# Patient Record
Sex: Male | Born: 1940
Health system: Southern US, Community
[De-identification: ages and names within clinical notes are randomized; demographics above are authoritative.]

## PROBLEM LIST (undated history)

## (undated) ENCOUNTER — Encounter

## (undated) ENCOUNTER — Telehealth

## (undated) DIAGNOSIS — Z9889 Other specified postprocedural states: Secondary | ICD-10-CM

## (undated) DIAGNOSIS — N189 Chronic kidney disease, unspecified: Secondary | ICD-10-CM

## (undated) DIAGNOSIS — G20A1 Parkinson's disease without dyskinesia, without mention of fluctuations: Secondary | ICD-10-CM

## (undated) DIAGNOSIS — K219 Gastro-esophageal reflux disease without esophagitis: Secondary | ICD-10-CM

## (undated) DIAGNOSIS — Z8673 Personal history of transient ischemic attack (TIA), and cerebral infarction without residual deficits: Secondary | ICD-10-CM

## (undated) DIAGNOSIS — N210 Calculus in bladder: Secondary | ICD-10-CM

## (undated) DIAGNOSIS — R351 Nocturia: Secondary | ICD-10-CM

## (undated) DIAGNOSIS — N183 Chronic kidney disease, stage 3 unspecified: Secondary | ICD-10-CM

## (undated) DIAGNOSIS — I6529 Occlusion and stenosis of unspecified carotid artery: Secondary | ICD-10-CM

## (undated) DIAGNOSIS — R350 Frequency of micturition: Secondary | ICD-10-CM

## (undated) DIAGNOSIS — E559 Vitamin D deficiency, unspecified: Secondary | ICD-10-CM

## (undated) DIAGNOSIS — N400 Enlarged prostate without lower urinary tract symptoms: Secondary | ICD-10-CM

## (undated) DIAGNOSIS — IMO0002 Reserved for concepts with insufficient information to code with codable children: Secondary | ICD-10-CM

## (undated) DIAGNOSIS — L309 Dermatitis, unspecified: Secondary | ICD-10-CM

## (undated) DIAGNOSIS — E785 Hyperlipidemia, unspecified: Secondary | ICD-10-CM

## (undated) DIAGNOSIS — Z8679 Personal history of other diseases of the circulatory system: Secondary | ICD-10-CM

## (undated) DIAGNOSIS — I679 Cerebrovascular disease, unspecified: Secondary | ICD-10-CM

## (undated) DIAGNOSIS — F411 Generalized anxiety disorder: Secondary | ICD-10-CM

## (undated) DIAGNOSIS — M1990 Unspecified osteoarthritis, unspecified site: Secondary | ICD-10-CM

## (undated) DIAGNOSIS — I10 Essential (primary) hypertension: Secondary | ICD-10-CM

## (undated) DIAGNOSIS — I639 Cerebral infarction, unspecified: Secondary | ICD-10-CM

## (undated) HISTORY — DX: Occlusion and stenosis of unspecified carotid artery: I65.29

## (undated) HISTORY — DX: Frequency of micturition: R35.0

## (undated) HISTORY — PX: APPENDECTOMY: SHX54

## (undated) HISTORY — DX: Unspecified osteoarthritis, unspecified site: M19.90

## (undated) HISTORY — DX: Generalized anxiety disorder: F41.1

## (undated) HISTORY — DX: Reserved for concepts with insufficient information to code with codable children: IMO0002

## (undated) HISTORY — PX: PROSTATE SURGERY: SHX751

## (undated) HISTORY — DX: Chronic kidney disease, unspecified: N18.9

## (undated) HISTORY — DX: Hyperlipidemia, unspecified: E78.5

## (undated) HISTORY — PX: CARDIOVASCULAR STRESS TEST: SHX262

## (undated) HISTORY — PX: CAROTID ENDARTERECTOMY: SUR193

## (undated) HISTORY — DX: Cerebral infarction, unspecified: I63.9

---

## 1994-06-14 DIAGNOSIS — I639 Cerebral infarction, unspecified: Secondary | ICD-10-CM

## 1994-06-14 HISTORY — DX: Cerebral infarction, unspecified: I63.9

## 1998-03-16 ENCOUNTER — Emergency Department (HOSPITAL_COMMUNITY): Admission: EM | Admit: 1998-03-16 | Discharge: 1998-03-16 | Payer: Self-pay | Admitting: Emergency Medicine

## 2004-06-14 DIAGNOSIS — I639 Cerebral infarction, unspecified: Secondary | ICD-10-CM | POA: Insufficient documentation

## 2004-06-14 HISTORY — DX: Cerebral infarction, unspecified: I63.9

## 2004-12-02 ENCOUNTER — Ambulatory Visit: Payer: Self-pay | Admitting: Family Medicine

## 2005-02-01 ENCOUNTER — Encounter: Admission: RE | Admit: 2005-02-01 | Discharge: 2005-05-02 | Payer: Self-pay | Admitting: Family Medicine

## 2005-02-09 ENCOUNTER — Ambulatory Visit: Payer: Self-pay | Admitting: Family Medicine

## 2005-02-19 ENCOUNTER — Ambulatory Visit: Payer: Self-pay | Admitting: Family Medicine

## 2005-03-12 ENCOUNTER — Ambulatory Visit: Payer: Self-pay | Admitting: Family Medicine

## 2005-04-02 ENCOUNTER — Ambulatory Visit: Payer: Self-pay | Admitting: Family Medicine

## 2005-04-20 ENCOUNTER — Ambulatory Visit: Payer: Self-pay | Admitting: Family Medicine

## 2005-04-29 ENCOUNTER — Ambulatory Visit: Payer: Self-pay | Admitting: Family Medicine

## 2005-07-06 ENCOUNTER — Ambulatory Visit: Payer: Self-pay | Admitting: Family Medicine

## 2005-07-12 ENCOUNTER — Ambulatory Visit: Payer: Self-pay | Admitting: Family Medicine

## 2005-07-13 ENCOUNTER — Emergency Department (HOSPITAL_COMMUNITY): Admission: EM | Admit: 2005-07-13 | Discharge: 2005-07-13 | Payer: Self-pay | Admitting: Emergency Medicine

## 2005-08-07 ENCOUNTER — Encounter: Admission: RE | Admit: 2005-08-07 | Discharge: 2005-08-07 | Payer: Self-pay | Admitting: Neurology

## 2005-10-13 ENCOUNTER — Encounter: Payer: Self-pay | Admitting: Emergency Medicine

## 2005-11-04 ENCOUNTER — Encounter: Admission: RE | Admit: 2005-11-04 | Discharge: 2005-11-04 | Payer: Self-pay | Admitting: *Deleted

## 2005-11-10 ENCOUNTER — Ambulatory Visit: Payer: Self-pay | Admitting: Family Medicine

## 2005-11-15 ENCOUNTER — Encounter: Admission: RE | Admit: 2005-11-15 | Discharge: 2005-11-15 | Payer: Self-pay | Admitting: Family Medicine

## 2006-04-12 ENCOUNTER — Ambulatory Visit: Payer: Self-pay | Admitting: Family Medicine

## 2006-04-22 ENCOUNTER — Ambulatory Visit: Payer: Self-pay | Admitting: Internal Medicine

## 2006-08-30 ENCOUNTER — Ambulatory Visit: Payer: Self-pay | Admitting: Family Medicine

## 2006-08-30 LAB — CONVERTED CEMR LAB
Alkaline Phosphatase: 64 units/L (ref 39–117)
Basophils Absolute: 0 10*3/uL (ref 0.0–0.1)
Basophils Relative: 1 % (ref 0–1)
Bilirubin, Direct: 0.1 mg/dL (ref 0.0–0.3)
CO2: 23 meq/L (ref 19–32)
Calcium: 9.7 mg/dL (ref 8.4–10.5)
Chloride: 103 meq/L (ref 96–112)
Eosinophils Absolute: 0.2 10*3/uL (ref 0.0–0.7)
Glucose, Bld: 105 mg/dL — ABNORMAL HIGH (ref 70–99)
HCT: 45.6 % (ref 39.0–52.0)
Indirect Bilirubin: 0.4 mg/dL (ref 0.0–0.9)
Lymphs Abs: 2.9 10*3/uL (ref 0.7–3.3)
MCV: 93.8 fL (ref 78.0–100.0)
Monocytes Absolute: 0.6 10*3/uL (ref 0.2–0.7)
Monocytes Relative: 7 % (ref 3–11)
Neutrophils Relative %: 55 % (ref 43–77)
RBC: 4.86 M/uL (ref 4.22–5.81)
WBC: 8.1 10*3/uL (ref 4.0–10.5)

## 2006-09-27 DIAGNOSIS — I6350 Cerebral infarction due to unspecified occlusion or stenosis of unspecified cerebral artery: Secondary | ICD-10-CM | POA: Insufficient documentation

## 2006-09-27 DIAGNOSIS — E785 Hyperlipidemia, unspecified: Secondary | ICD-10-CM | POA: Insufficient documentation

## 2006-10-27 ENCOUNTER — Ambulatory Visit: Payer: Self-pay | Admitting: *Deleted

## 2006-10-27 ENCOUNTER — Encounter: Payer: Self-pay | Admitting: Family Medicine

## 2006-10-28 ENCOUNTER — Ambulatory Visit: Payer: Self-pay | Admitting: Family Medicine

## 2006-11-08 ENCOUNTER — Ambulatory Visit (HOSPITAL_BASED_OUTPATIENT_CLINIC_OR_DEPARTMENT_OTHER): Admission: RE | Admit: 2006-11-08 | Discharge: 2006-11-08 | Payer: Self-pay | Admitting: General Surgery

## 2006-11-08 ENCOUNTER — Encounter: Payer: Self-pay | Admitting: Family Medicine

## 2006-11-08 HISTORY — PX: INGUINAL HERNIA REPAIR: SUR1180

## 2006-12-21 ENCOUNTER — Ambulatory Visit: Payer: Self-pay | Admitting: Family Medicine

## 2006-12-21 DIAGNOSIS — Z8679 Personal history of other diseases of the circulatory system: Secondary | ICD-10-CM | POA: Insufficient documentation

## 2006-12-21 DIAGNOSIS — F411 Generalized anxiety disorder: Secondary | ICD-10-CM | POA: Insufficient documentation

## 2006-12-21 DIAGNOSIS — R7309 Other abnormal glucose: Secondary | ICD-10-CM

## 2006-12-21 DIAGNOSIS — R498 Other voice and resonance disorders: Secondary | ICD-10-CM | POA: Insufficient documentation

## 2006-12-21 HISTORY — DX: Other abnormal glucose: R73.09

## 2006-12-28 ENCOUNTER — Encounter: Payer: Self-pay | Admitting: Family Medicine

## 2007-01-23 ENCOUNTER — Telehealth (INDEPENDENT_AMBULATORY_CARE_PROVIDER_SITE_OTHER): Payer: Self-pay | Admitting: *Deleted

## 2007-01-27 ENCOUNTER — Encounter: Payer: Self-pay | Admitting: Family Medicine

## 2007-01-27 ENCOUNTER — Ambulatory Visit (HOSPITAL_COMMUNITY): Admission: RE | Admit: 2007-01-27 | Discharge: 2007-01-27 | Payer: Self-pay | Admitting: Gastroenterology

## 2007-01-27 ENCOUNTER — Encounter (INDEPENDENT_AMBULATORY_CARE_PROVIDER_SITE_OTHER): Payer: Self-pay | Admitting: Gastroenterology

## 2007-02-01 ENCOUNTER — Encounter: Payer: Self-pay | Admitting: Family Medicine

## 2007-03-22 ENCOUNTER — Ambulatory Visit: Payer: Self-pay | Admitting: Family Medicine

## 2007-03-29 ENCOUNTER — Encounter (INDEPENDENT_AMBULATORY_CARE_PROVIDER_SITE_OTHER): Payer: Self-pay | Admitting: *Deleted

## 2007-03-29 LAB — CONVERTED CEMR LAB
BUN: 15 mg/dL (ref 6–23)
Calcium: 9.7 mg/dL (ref 8.4–10.5)
Chloride: 109 meq/L (ref 96–112)
Creatinine, Ser: 1.2 mg/dL (ref 0.4–1.5)
Potassium: 4.2 meq/L (ref 3.5–5.1)
Sodium: 143 meq/L (ref 135–145)

## 2007-04-03 ENCOUNTER — Encounter: Payer: Self-pay | Admitting: Family Medicine

## 2007-04-10 ENCOUNTER — Encounter: Payer: Self-pay | Admitting: Family Medicine

## 2007-04-20 ENCOUNTER — Ambulatory Visit: Payer: Self-pay | Admitting: Family Medicine

## 2007-04-25 ENCOUNTER — Encounter: Payer: Self-pay | Admitting: Family Medicine

## 2007-05-04 ENCOUNTER — Ambulatory Visit: Payer: Self-pay | Admitting: *Deleted

## 2007-07-17 ENCOUNTER — Telehealth (INDEPENDENT_AMBULATORY_CARE_PROVIDER_SITE_OTHER): Payer: Self-pay | Admitting: *Deleted

## 2007-07-28 ENCOUNTER — Ambulatory Visit: Payer: Self-pay | Admitting: Family Medicine

## 2007-07-28 ENCOUNTER — Telehealth (INDEPENDENT_AMBULATORY_CARE_PROVIDER_SITE_OTHER): Payer: Self-pay | Admitting: *Deleted

## 2007-07-31 ENCOUNTER — Encounter (INDEPENDENT_AMBULATORY_CARE_PROVIDER_SITE_OTHER): Payer: Self-pay | Admitting: *Deleted

## 2007-08-01 ENCOUNTER — Encounter: Payer: Self-pay | Admitting: Family Medicine

## 2007-08-06 DIAGNOSIS — Z87898 Personal history of other specified conditions: Secondary | ICD-10-CM

## 2007-08-06 DIAGNOSIS — N400 Enlarged prostate without lower urinary tract symptoms: Secondary | ICD-10-CM | POA: Insufficient documentation

## 2007-08-06 HISTORY — DX: Benign prostatic hyperplasia without lower urinary tract symptoms: N40.0

## 2007-08-23 ENCOUNTER — Ambulatory Visit: Payer: Self-pay | Admitting: Vascular Surgery

## 2007-08-23 ENCOUNTER — Encounter (INDEPENDENT_AMBULATORY_CARE_PROVIDER_SITE_OTHER): Payer: Self-pay | Admitting: Emergency Medicine

## 2007-08-23 ENCOUNTER — Emergency Department (HOSPITAL_COMMUNITY): Admission: EM | Admit: 2007-08-23 | Discharge: 2007-08-23 | Payer: Self-pay | Admitting: Emergency Medicine

## 2007-09-01 ENCOUNTER — Ambulatory Visit (HOSPITAL_COMMUNITY): Admission: RE | Admit: 2007-09-01 | Discharge: 2007-09-01 | Payer: Self-pay | Admitting: Neurology

## 2007-11-22 ENCOUNTER — Ambulatory Visit: Payer: Self-pay | Admitting: Family Medicine

## 2007-11-22 DIAGNOSIS — R1012 Left upper quadrant pain: Secondary | ICD-10-CM | POA: Insufficient documentation

## 2008-01-24 ENCOUNTER — Ambulatory Visit: Payer: Self-pay | Admitting: Family Medicine

## 2008-01-26 ENCOUNTER — Ambulatory Visit: Payer: Self-pay | Admitting: Family Medicine

## 2008-01-26 DIAGNOSIS — R5381 Other malaise: Secondary | ICD-10-CM | POA: Insufficient documentation

## 2008-01-26 DIAGNOSIS — R5383 Other fatigue: Secondary | ICD-10-CM

## 2008-01-26 LAB — CONVERTED CEMR LAB
AST: 29 units/L (ref 0–37)
Albumin: 3.8 g/dL (ref 3.5–5.2)
Alkaline Phosphatase: 49 units/L (ref 39–117)
Bilirubin, Direct: 0.1 mg/dL (ref 0.0–0.3)
Cholesterol: 124 mg/dL (ref 0–200)
Total Bilirubin: 0.7 mg/dL (ref 0.3–1.2)
Total Protein: 7.6 g/dL (ref 6.0–8.3)

## 2008-01-27 LAB — CONVERTED CEMR LAB
ALT: 24 units/L (ref 0–53)
Albumin: 3.9 g/dL (ref 3.5–5.2)
Basophils Absolute: 0.1 10*3/uL (ref 0.0–0.1)
Basophils Relative: 0.8 % (ref 0.0–3.0)
Bilirubin, Direct: 0.1 mg/dL (ref 0.0–0.3)
CO2: 28 meq/L (ref 19–32)
Calcium: 9.4 mg/dL (ref 8.4–10.5)
Chloride: 106 meq/L (ref 96–112)
Eosinophils Relative: 2.8 % (ref 0.0–5.0)
GFR calc Af Amer: 71 mL/min
GFR calc non Af Amer: 59 mL/min
Hemoglobin: 13.4 g/dL (ref 13.0–17.0)
Hgb A1c MFr Bld: 5.8 % (ref 4.6–6.0)
Lymphocytes Relative: 31.6 % (ref 12.0–46.0)
MCHC: 34 g/dL (ref 30.0–36.0)
Monocytes Absolute: 0.7 10*3/uL (ref 0.1–1.0)
Monocytes Relative: 9.2 % (ref 3.0–12.0)
Neutrophils Relative %: 55.6 % (ref 43.0–77.0)
Potassium: 4.4 meq/L (ref 3.5–5.1)
RDW: 12.8 % (ref 11.5–14.6)
Sodium: 140 meq/L (ref 135–145)
TSH: 1.44 microintl units/mL (ref 0.35–5.50)
Total Bilirubin: 0.7 mg/dL (ref 0.3–1.2)

## 2008-01-29 ENCOUNTER — Encounter (INDEPENDENT_AMBULATORY_CARE_PROVIDER_SITE_OTHER): Payer: Self-pay | Admitting: *Deleted

## 2008-02-05 ENCOUNTER — Telehealth (INDEPENDENT_AMBULATORY_CARE_PROVIDER_SITE_OTHER): Payer: Self-pay | Admitting: *Deleted

## 2008-02-06 LAB — CONVERTED CEMR LAB
Sex Hormone Binding: 63 nmol/L (ref 13–71)
Testosterone Free: 49.2 pg/mL (ref 47.0–244.0)
Testosterone-% Free: 1.3 % — ABNORMAL LOW (ref 1.6–2.9)
Testosterone: 380.23 ng/dL (ref 350–890)

## 2008-02-07 ENCOUNTER — Encounter (INDEPENDENT_AMBULATORY_CARE_PROVIDER_SITE_OTHER): Payer: Self-pay | Admitting: *Deleted

## 2008-02-20 ENCOUNTER — Telehealth (INDEPENDENT_AMBULATORY_CARE_PROVIDER_SITE_OTHER): Payer: Self-pay | Admitting: *Deleted

## 2008-02-26 ENCOUNTER — Ambulatory Visit: Payer: Self-pay | Admitting: Family Medicine

## 2008-02-28 LAB — CONVERTED CEMR LAB: Vit D, 1,25-Dihydroxy: 32 (ref 30–89)

## 2008-02-29 ENCOUNTER — Encounter (INDEPENDENT_AMBULATORY_CARE_PROVIDER_SITE_OTHER): Payer: Self-pay | Admitting: *Deleted

## 2008-03-01 ENCOUNTER — Ambulatory Visit: Payer: Self-pay | Admitting: Family Medicine

## 2008-03-01 DIAGNOSIS — E559 Vitamin D deficiency, unspecified: Secondary | ICD-10-CM | POA: Insufficient documentation

## 2008-03-04 ENCOUNTER — Encounter: Payer: Self-pay | Admitting: Family Medicine

## 2008-03-04 ENCOUNTER — Ambulatory Visit: Payer: Self-pay | Admitting: Internal Medicine

## 2008-03-29 ENCOUNTER — Encounter (INDEPENDENT_AMBULATORY_CARE_PROVIDER_SITE_OTHER): Payer: Self-pay | Admitting: *Deleted

## 2008-04-29 ENCOUNTER — Telehealth (INDEPENDENT_AMBULATORY_CARE_PROVIDER_SITE_OTHER): Payer: Self-pay | Admitting: *Deleted

## 2008-05-31 ENCOUNTER — Ambulatory Visit: Payer: Self-pay | Admitting: Family Medicine

## 2008-05-31 DIAGNOSIS — L2089 Other atopic dermatitis: Secondary | ICD-10-CM | POA: Insufficient documentation

## 2008-06-17 ENCOUNTER — Ambulatory Visit: Payer: Self-pay | Admitting: Family Medicine

## 2008-06-19 LAB — CONVERTED CEMR LAB
Albumin: 4.1 g/dL (ref 3.5–5.2)
Alkaline Phosphatase: 51 units/L (ref 39–117)
BUN: 17 mg/dL (ref 6–23)
Chloride: 104 meq/L (ref 96–112)
Hgb A1c MFr Bld: 5.8 % (ref 4.6–6.0)
Total Bilirubin: 0.6 mg/dL (ref 0.3–1.2)
Total CHOL/HDL Ratio: 2.4
Total Protein: 7.9 g/dL (ref 6.0–8.3)
Triglycerides: 85 mg/dL (ref 0–149)
Vit D, 1,25-Dihydroxy: 62 (ref 30–89)

## 2008-06-21 ENCOUNTER — Encounter (INDEPENDENT_AMBULATORY_CARE_PROVIDER_SITE_OTHER): Payer: Self-pay | Admitting: *Deleted

## 2008-08-05 ENCOUNTER — Telehealth (INDEPENDENT_AMBULATORY_CARE_PROVIDER_SITE_OTHER): Payer: Self-pay | Admitting: *Deleted

## 2008-08-21 ENCOUNTER — Encounter: Payer: Self-pay | Admitting: Family Medicine

## 2008-08-26 ENCOUNTER — Telehealth (INDEPENDENT_AMBULATORY_CARE_PROVIDER_SITE_OTHER): Payer: Self-pay | Admitting: *Deleted

## 2008-09-09 ENCOUNTER — Ambulatory Visit: Payer: Self-pay | Admitting: Family Medicine

## 2008-09-11 ENCOUNTER — Encounter (INDEPENDENT_AMBULATORY_CARE_PROVIDER_SITE_OTHER): Payer: Self-pay | Admitting: *Deleted

## 2008-09-11 LAB — CONVERTED CEMR LAB: Vit D, 25-Hydroxy: 57 ng/mL (ref 30–89)

## 2008-10-03 ENCOUNTER — Ambulatory Visit: Payer: Self-pay | Admitting: *Deleted

## 2008-10-10 ENCOUNTER — Ambulatory Visit: Payer: Self-pay | Admitting: *Deleted

## 2008-10-10 ENCOUNTER — Ambulatory Visit (HOSPITAL_COMMUNITY): Admission: RE | Admit: 2008-10-10 | Discharge: 2008-10-10 | Payer: Self-pay | Admitting: *Deleted

## 2008-10-31 ENCOUNTER — Ambulatory Visit (HOSPITAL_COMMUNITY): Admission: RE | Admit: 2008-10-31 | Discharge: 2008-10-31 | Payer: Self-pay | Admitting: *Deleted

## 2008-11-12 ENCOUNTER — Ambulatory Visit: Payer: Self-pay | Admitting: *Deleted

## 2008-11-12 ENCOUNTER — Inpatient Hospital Stay (HOSPITAL_COMMUNITY): Admission: RE | Admit: 2008-11-12 | Discharge: 2008-11-13 | Payer: Self-pay | Admitting: *Deleted

## 2008-11-12 ENCOUNTER — Encounter (INDEPENDENT_AMBULATORY_CARE_PROVIDER_SITE_OTHER): Payer: Self-pay | Admitting: *Deleted

## 2008-12-05 ENCOUNTER — Ambulatory Visit: Payer: Self-pay | Admitting: *Deleted

## 2008-12-23 ENCOUNTER — Ambulatory Visit: Payer: Self-pay | Admitting: Family Medicine

## 2008-12-30 LAB — CONVERTED CEMR LAB
ALT: 24 U/L (ref 0–53)
AST: 27 U/L (ref 0–37)
Albumin: 4 g/dL (ref 3.5–5.2)
Alkaline Phosphatase: 64 U/L (ref 39–117)
BUN: 17 mg/dL (ref 6–23)
Bilirubin, Direct: 0.1 mg/dL (ref 0.0–0.3)
CO2: 29 meq/L (ref 19–32)
Calcium: 9.5 mg/dL (ref 8.4–10.5)
Chloride: 107 meq/L (ref 96–112)
Cholesterol: 137 mg/dL (ref 0–200)
Creatinine, Ser: 1.2 mg/dL (ref 0.4–1.5)
GFR calc non Af Amer: 64.3 mL/min (ref >60)
Glucose, Bld: 104 mg/dL — ABNORMAL HIGH (ref 70–99)
HDL: 52 mg/dL (ref >39.00)
Hgb A1c MFr Bld: 5.8 % (ref 4.6–6.5)
LDL Cholesterol: 70 mg/dL (ref 0–99)
Potassium: 3.9 meq/L (ref 3.5–5.1)
Sodium: 143 meq/L (ref 135–145)
Total Bilirubin: 0.7 mg/dL (ref 0.3–1.2)
Total CHOL/HDL Ratio: 3
Total Protein: 7.7 g/dL (ref 6.0–8.3)
Triglycerides: 76 mg/dL (ref 0.0–149.0)
VLDL: 15.2 mg/dL (ref 0.0–40.0)

## 2008-12-31 ENCOUNTER — Encounter (INDEPENDENT_AMBULATORY_CARE_PROVIDER_SITE_OTHER): Payer: Self-pay | Admitting: *Deleted

## 2009-03-03 ENCOUNTER — Telehealth (INDEPENDENT_AMBULATORY_CARE_PROVIDER_SITE_OTHER): Payer: Self-pay | Admitting: *Deleted

## 2009-04-01 ENCOUNTER — Ambulatory Visit: Payer: Self-pay | Admitting: Family Medicine

## 2009-05-02 ENCOUNTER — Ambulatory Visit: Payer: Self-pay | Admitting: Cardiology

## 2009-05-02 ENCOUNTER — Ambulatory Visit: Payer: Self-pay | Admitting: Family Medicine

## 2009-05-02 DIAGNOSIS — R51 Headache: Secondary | ICD-10-CM | POA: Insufficient documentation

## 2009-05-02 DIAGNOSIS — R42 Dizziness and giddiness: Secondary | ICD-10-CM | POA: Insufficient documentation

## 2009-05-02 DIAGNOSIS — I6529 Occlusion and stenosis of unspecified carotid artery: Secondary | ICD-10-CM | POA: Insufficient documentation

## 2009-05-02 DIAGNOSIS — I6523 Occlusion and stenosis of bilateral carotid arteries: Secondary | ICD-10-CM | POA: Insufficient documentation

## 2009-05-30 ENCOUNTER — Ambulatory Visit: Payer: Self-pay | Admitting: Family Medicine

## 2009-06-02 ENCOUNTER — Ambulatory Visit: Payer: Self-pay | Admitting: Vascular Surgery

## 2009-06-03 ENCOUNTER — Encounter (INDEPENDENT_AMBULATORY_CARE_PROVIDER_SITE_OTHER): Payer: Self-pay | Admitting: *Deleted

## 2009-06-03 LAB — CONVERTED CEMR LAB
AST: 26 units/L (ref 0–37)
Albumin: 4.2 g/dL (ref 3.5–5.2)
Alkaline Phosphatase: 50 units/L (ref 39–117)
Basophils Absolute: 0.1 10*3/uL (ref 0.0–0.1)
Bilirubin, Direct: 0 mg/dL (ref 0.0–0.3)
Cholesterol: 134 mg/dL (ref 0–200)
Creatinine, Ser: 1.4 mg/dL (ref 0.4–1.5)
Eosinophils Relative: 2.5 % (ref 0.0–5.0)
HDL: 53.7 mg/dL (ref >39.00)
Hemoglobin: 14.3 g/dL (ref 13.0–17.0)
Lymphocytes Relative: 27.2 % (ref 12.0–46.0)
Lymphs Abs: 1.9 10*3/uL (ref 0.7–4.0)
MCV: 94.4 fL (ref 78.0–100.0)
Monocytes Relative: 9.9 % (ref 3.0–12.0)
Neutro Abs: 4.2 10*3/uL (ref 1.4–7.7)
Platelets: 233 10*3/uL (ref 150.0–400.0)
Potassium: 4.1 meq/L (ref 3.5–5.1)
RBC: 4.62 M/uL (ref 4.22–5.81)
RDW: 13 % (ref 11.5–14.6)
Sodium: 140 meq/L (ref 135–145)
Total Bilirubin: 0.6 mg/dL (ref 0.3–1.2)
Triglycerides: 67 mg/dL (ref 0.0–149.0)
VLDL: 13.4 mg/dL (ref 0.0–40.0)
WBC: 7.1 10*3/uL (ref 4.5–10.5)

## 2009-06-05 ENCOUNTER — Ambulatory Visit: Payer: Self-pay | Admitting: Family Medicine

## 2009-06-05 ENCOUNTER — Encounter (INDEPENDENT_AMBULATORY_CARE_PROVIDER_SITE_OTHER): Payer: Self-pay | Admitting: *Deleted

## 2009-06-05 LAB — CONVERTED CEMR LAB
OCCULT 1: NEGATIVE
OCCULT 2: NEGATIVE
OCCULT 3: NEGATIVE

## 2009-06-17 ENCOUNTER — Ambulatory Visit: Payer: Self-pay | Admitting: Family

## 2009-06-17 DIAGNOSIS — J029 Acute pharyngitis, unspecified: Secondary | ICD-10-CM | POA: Insufficient documentation

## 2009-06-17 LAB — CONVERTED CEMR LAB: Rapid Strep: NEGATIVE

## 2009-08-01 ENCOUNTER — Ambulatory Visit: Payer: Self-pay | Admitting: Family Medicine

## 2009-08-01 DIAGNOSIS — F341 Dysthymic disorder: Secondary | ICD-10-CM | POA: Insufficient documentation

## 2009-08-29 ENCOUNTER — Ambulatory Visit: Payer: Self-pay | Admitting: Family Medicine

## 2009-08-29 DIAGNOSIS — R079 Chest pain, unspecified: Secondary | ICD-10-CM | POA: Insufficient documentation

## 2009-09-08 ENCOUNTER — Encounter: Payer: Self-pay | Admitting: Family Medicine

## 2009-09-24 ENCOUNTER — Telehealth: Payer: Self-pay | Admitting: Family Medicine

## 2009-09-25 ENCOUNTER — Ambulatory Visit: Payer: Self-pay | Admitting: Family Medicine

## 2009-09-25 DIAGNOSIS — R350 Frequency of micturition: Secondary | ICD-10-CM | POA: Insufficient documentation

## 2009-09-25 LAB — CONVERTED CEMR LAB
Blood in Urine, dipstick: NEGATIVE
Glucose, Bld: 94 mg/dL
Glucose, Urine, Semiquant: NEGATIVE
pH: 7.5

## 2009-10-22 ENCOUNTER — Emergency Department (HOSPITAL_COMMUNITY): Admission: EM | Admit: 2009-10-22 | Discharge: 2009-10-22 | Payer: Self-pay | Admitting: Emergency Medicine

## 2009-12-10 ENCOUNTER — Ambulatory Visit: Payer: Self-pay | Admitting: Vascular Surgery

## 2010-01-05 ENCOUNTER — Encounter: Payer: Self-pay | Admitting: Family Medicine

## 2010-02-02 ENCOUNTER — Telehealth (INDEPENDENT_AMBULATORY_CARE_PROVIDER_SITE_OTHER): Payer: Self-pay | Admitting: *Deleted

## 2010-02-06 ENCOUNTER — Ambulatory Visit (HOSPITAL_COMMUNITY): Admission: RE | Admit: 2010-02-06 | Discharge: 2010-02-06 | Payer: Self-pay | Admitting: Gastroenterology

## 2010-02-06 LAB — HM COLONOSCOPY

## 2010-02-11 ENCOUNTER — Ambulatory Visit: Payer: Self-pay | Admitting: Family Medicine

## 2010-02-12 LAB — CONVERTED CEMR LAB
ALT: 28 units/L (ref 0–53)
Alkaline Phosphatase: 60 units/L (ref 39–117)
Bilirubin, Direct: 0.1 mg/dL (ref 0.0–0.3)
CO2: 29 meq/L (ref 19–32)
Calcium: 9.7 mg/dL (ref 8.4–10.5)
GFR calc non Af Amer: 54.08 mL/min (ref >60)
Glucose, Bld: 95 mg/dL (ref 70–99)
HDL: 52.7 mg/dL (ref >39.00)
LDL Cholesterol: 65 mg/dL (ref 0–99)
Total Bilirubin: 0.5 mg/dL (ref 0.3–1.2)
Total CHOL/HDL Ratio: 2
Total Protein: 7.7 g/dL (ref 6.0–8.3)
Triglycerides: 61 mg/dL (ref 0.0–149.0)

## 2010-03-09 ENCOUNTER — Telehealth (INDEPENDENT_AMBULATORY_CARE_PROVIDER_SITE_OTHER): Payer: Self-pay | Admitting: *Deleted

## 2010-03-27 ENCOUNTER — Ambulatory Visit: Payer: Self-pay | Admitting: Family Medicine

## 2010-07-05 ENCOUNTER — Encounter: Payer: Self-pay | Admitting: *Deleted

## 2010-07-12 LAB — CONVERTED CEMR LAB
AST: 28 units/L (ref 0–37)
AST: 37 units/L (ref 0–37)
Albumin: 3.9 g/dL (ref 3.5–5.2)
Alkaline Phosphatase: 61 units/L (ref 39–117)
Alkaline Phosphatase: 64 units/L (ref 39–117)
BUN: 20 mg/dL (ref 6–23)
BUN: 21 mg/dL (ref 6–23)
Basophils Absolute: 0 10*3/uL (ref 0.0–0.1)
Basophils Absolute: 0 10*3/uL (ref 0.0–0.1)
Basophils Relative: 0.5 % (ref 0.0–3.0)
Bilirubin, Direct: 0.1 mg/dL (ref 0.0–0.3)
CO2: 27 meq/L (ref 19–32)
CO2: 27 meq/L (ref 19–32)
CO2: 28 meq/L (ref 19–32)
Calcium: 10.4 mg/dL (ref 8.4–10.5)
Calcium: 9.9 mg/dL (ref 8.4–10.5)
Chloride: 106 meq/L (ref 96–112)
Chloride: 106 meq/L (ref 96–112)
Creatinine, Ser: 1.5 mg/dL (ref 0.4–1.5)
Creatinine, Ser: 1.5 mg/dL (ref 0.4–1.5)
Eosinophils Absolute: 0.2 10*3/uL (ref 0.0–0.6)
Folate: 19.5 ng/mL
GFR calc Af Amer: 78 mL/min
Glucose, Bld: 113 mg/dL — ABNORMAL HIGH (ref 70–99)
Hgb A1c MFr Bld: 5.6 % (ref 4.6–6.0)
Lymphocytes Relative: 28 % (ref 12.0–46.0)
Lymphs Abs: 2.2 10*3/uL (ref 0.7–4.0)
MCHC: 33.6 g/dL (ref 30.0–36.0)
MCV: 93.6 fL (ref 78.0–100.0)
Monocytes Absolute: 0.7 10*3/uL (ref 0.1–1.0)
Neutro Abs: 4.6 10*3/uL (ref 1.4–7.7)
Platelets: 230 10*3/uL (ref 150.0–400.0)
Platelets: 260 10*3/uL (ref 150–400)
RBC: 4.66 M/uL (ref 4.22–5.81)
RDW: 12.9 % (ref 11.5–14.6)
RDW: 13 % (ref 11.5–14.6)
Sed Rate: 15 mm/hr (ref 0–22)
Sodium: 141 meq/L (ref 135–145)
Total Bilirubin: 0.7 mg/dL (ref 0.3–1.2)
Total Bilirubin: 0.8 mg/dL (ref 0.3–1.2)
Total Protein: 7.7 g/dL (ref 6.0–8.3)
Total Protein: 8.2 g/dL (ref 6.0–8.3)
Vitamin B-12: 409 pg/mL (ref 211–911)
WBC: 8.2 10*3/uL (ref 4.5–10.5)

## 2010-07-14 NOTE — Assessment & Plan Note (Signed)
Summary: 1 month roa//lch   Vital Signs:  Patient profile:   71 year old male Weight:      210 pounds Pulse rate:   64 / minute Pulse rhythm:   regular BP sitting:   124 / 80  (left arm) Cuff size:   regular  Vitals Entered By: [place] (August 29, 2009 9:35 AM) CC: follow up on meds, working well.    History of Present Illness: Pt here with wife ---- pt doing better but still has some anger issues and still very emotional----It is however much better than previously.    Pt also c/o L side pain after replacing shower head over weekend--he slipped and felt "catch" in L side.  It has not gotten any better.  Allergies: 1)  ! Transpore Plastic Engineer, production)  Physical Exam  General:  Well-developed,well-nourished,in no acute distress; alert,appropriate and cooperative throughout examination Abdomen:  Bowel sounds positive,abdomen soft and non-tender without masses, organomegaly or hernias noted. Msk:  normal ROM, no joint swelling, no joint warmth, no redness over joints, and no joint deformities.   Neurologic:  alert & oriented X3.   Skin:  Intact without suspicious lesions or rashes Psych:  Oriented X3, normally interactive, good eye contact, not anxious appearing, and not depressed appearing.     Impression & Recommendations:  Problem # 1:  DEPRESSION/ANXIETY (ICD-300.4) increase lexapro to 20 mg daily increase abilify to 5 mg daily rto 1 month  Problem # 2:  RIB PAIN, LEFT SIDED (ICD-786.50) probably musculoskeletal warm compresses SOMA 250 mg  at bedtime as needed   Complete Medication List: 1)  Bayer Aspirin 325 Mg Tabs (Aspirin) .[name].. 1 once daily 2)  Tricor 145 Mg Tabs (Fenofibrate) .[name].. 1 once daily 3)  Flomax 0.4 Mg Cp24 (Tamsulosin hcl) .[name].. 1 once daily 4)  Crestor 40 Mg Tabs (Rosuvastatin calcium) .[name].. 1 by mouth at bedtime 5)  Nexium 40 Mg Cpdr (Esomeprazole magnesium) .[name].. 1 by mouth once daily 6)  Multivitamins Caps (Multiple vitamin) ....  Take one tablet daily. 7)  B Complex Vitamins Caps (B complex vitamins) .... Take 1 tab once daily 8)  Vitamin D3 2000 Unit Caps (Cholecalciferol) .[name].. 1 by mouth daily 9)  Calcium 1200-1000 Mg-unit Chew (Calcium carbonate-vit d-min) .[name].. 1 by mouth daily 10)  Abilify 5 Mg Tabs (Aripiprazole) .[name].. 1 by mouth once daily 11)  Alprazolam 0.25 Mg Tabs (Alprazolam) .[name].. 1 by mouth three times a day as needed 12)  Lexapro 20 Mg Tabs (Escitalopram oxalate) .[name].. 1 by mouth once daily 13)  Soma 250 Mg Tabs (Carisoprodol) Prescriptions: LEXAPRO 20 MG TABS (ESCITALOPRAM OXALATE) 1 by mouth once daily  #90 x 3   Entered and Authorized by:   Garnet Koyanagi DO   Signed by:   Garnet Koyanagi DO on 08/29/2009   Method used:   Electronically to        [facility] (retail)       [address]       [place], [place]  [address]       Ph: [phone]       Fax: [phone]   RxID:   [id number] ABILIFY 5 MG TABS (ARIPIPRAZOLE) 1 by mouth once daily  #90 x 3   Entered and Authorized by:   Garnet Koyanagi DO   Signed by:   Garnet Koyanagi DO on 08/29/2009   Method used:   Electronically to  [facility] (retail)       [address]       [place], [place]  [address]       Ph: [phone]       Fax: [phone]   RxID:   [id number] ABILIFY 5 MG TABS (ARIPIPRAZOLE) 1 by mouth once daily  #30 x 2   Entered and Authorized by:   Garnet Koyanagi DO   Signed by:   Garnet Koyanagi DO on 08/29/2009   Method used:   Print then Give to Patient   RxID:   [id number] LEXAPRO 20 MG TABS (ESCITALOPRAM OXALATE) 1 by mouth once daily  #30 x 2   Entered and Authorized by:   Garnet Koyanagi DO   Signed by:   Garnet Koyanagi DO on 08/29/2009   Method used:   Electronically to        [facility]. # [id number]* (retail)       [address]       [place], [place]  [address]       Ph:  [phone] or [phone]       Fax: [phone]   RxID:   [id number]   Appended Document: 1 month roa//lch    Clinical Lists Changes  Observations: Added new observation of COMMENTS: [name] CMA  August 29, 2009 10:48 AM  (08/29/2009 10:47) Added new observation of [place] URINE: 6.5  (08/29/2009 10:47) Added new observation of SPEC GR URIN: 1.020  (08/29/2009 10:47) Added new observation of APPEARANCE U: Clear  (08/29/2009 10:47) Added new observation of UA COLOR: yellow  (08/29/2009 10:47) Added new observation of WBC DIPSTK U: negative  (08/29/2009 10:47) Added new observation of NITRITE URN: negative  (08/29/2009 10:47) Added new observation of UROBILINOGEN: 0.2  (08/29/2009 10:47) Added new observation of PROTEIN, URN: negative  (08/29/2009 10:47) Added new observation of BLOOD UR DIP: negative  (08/29/2009 10:47) Added new observation of KETONES URN: negative  (08/29/2009 10:47) Added new observation of BILIRUBIN UR: negative  (08/29/2009 10:47) Added new observation of GLUCOSE, URN: negative  (08/29/2009 10:47)      Laboratory Results   Urine Tests    Routine Urinalysis   Color: yellow Appearance: Clear Glucose: negative   (Normal Range: Negative) Bilirubin: negative   (Normal Range: Negative) Ketone: negative   (Normal Range: Negative) Spec. Gravity: 1.020   (Normal Range: 1.003-1.035) Blood: negative   (Normal Range: Negative) pH: 6.5   (Normal Range: 5.0-8.0) Protein: negative   (Normal Range: Negative) Urobilinogen: 0.2   (Normal Range: 0-1) Nitrite: negative   (Normal Range: Negative) Leukocyte Esterace: negative   (Normal Range: Negative)    Comments: [name] CMA  August 29, 2009 10:48 AM

## 2010-07-14 NOTE — Assessment & Plan Note (Signed)
Summary: flu shot/cbs  Nurse Visit  CC: Flu shot./kb   Allergies: 1)  ! Transpore Plastic (Adhesive Tape)  Orders Added: 1)  Admin 1st Vaccine [90471] 2)  Flu Vaccine 41yrs + [id number]         Flu Vaccine Consent Questions     Do you have a history of severe allergic reactions to this vaccine? no    Any prior history of allergic reactions to egg and/or gelatin? no    Do you have a sensitivity to the preservative Thimersol? no    Do you have a past history of Guillan-Barre Syndrome? no    Do you currently have an acute febrile illness? no    Have you ever had a severe reaction to latex? no    Vaccine information given and explained to patient? yes    Are you currently pregnant? no    Lot Number:[id number]   Exp Date:12/12/2010   Site Given  Left Deltoid IMu

## 2010-07-14 NOTE — Assessment & Plan Note (Signed)
Summary: SORE THROAT/KDC   Vital Signs:  Patient profile:   70 year old male Weight:      209.8 pounds Temp:     97.9 degrees F oral BP sitting:   118 / 78  (left arm)  Vitals Entered By: William [name] (June 17, 2009 11:24 AM) CC: sore throat x2-3 days    Primary Care Provider:  Etter [name]  CC:  sore throat x2-3 days .  History of Present Illness: William [name] is a 70 year old male who presents with c/o sore throat x 3 days.  Denies associated fever.  Allergies: 1)  ! Transpore Plastic (Adhesive Tape)  Review of Systems       denies associated nasal congestion or ear pain.    Physical Exam  General:  Well-developed,well-nourished,in no acute distress; alert,appropriate and cooperative throughout examination Head:  Normocephalic and atraumatic without obvious abnormalities. No apparent alopecia or balding. Eyes:  PERRLA Ears:  External ear exam shows no significant lesions or deformities.  Otoscopic examination reveals clear canals, tympanic membranes are intact bilaterally without bulging, retraction, inflammation or discharge. Hearing is grossly normal bilaterally. Mouth:  + pharyngeal erythema.   Neck:  No deformities, masses, or tenderness noted. Lungs:  Normal respiratory effort, chest expands symmetrically. Lungs are clear to auscultation, no crackles or wheezes. Heart:  Normal rate and regular rhythm. S1 and S2 normal without gallop, murmur, click, rub or other extra sounds.   Impression & Recommendations:  Problem # 1:  PHARYNGITIS, VIRAL (ICD-462) Assessment New Rapid strep is negative- recommended supportive measures, salt water gargle, chloraseptic spray, rest ,  Tylenol PRN  Complete Medication List: 1)  Bayer Aspirin 325 Mg Tabs (Aspirin) .[name].. 1 once daily 2)  Tricor 145 Mg Tabs (Fenofibrate) .[name].. 1 once daily 3)  Paxil 20 Mg Tabs (Paroxetine hcl) .[name].. 1 by mouth once daily 4)  Flomax 0.4 Mg Cp24 (Tamsulosin hcl) .[name].. 1 once daily 5)  Crestor 40 Mg Tabs  (Rosuvastatin calcium) .[name].. 1 by mouth at bedtime 6)  Nexium 40 Mg Cpdr (Esomeprazole magnesium) .[name].. 1 by mouth once daily 7)  Multivitamins Caps (Multiple vitamin) .... Take one tablet daily. 8)  B Complex Vitamins Caps (B complex vitamins) .... Take 1 tab once daily 9)  Prozac 20 Mg Caps (Fluoxetine hcl) .[name].. 1 by mouth qam  Other Orders: Rapid Strep [id number])  Patient Instructions: 1)  Please  call if you develop fever over 101 or if your symptoms to not improve.  You may use tylenol or chloraseptic spray as needed,  Laboratory Results    Other Tests  Rapid Strep: negative  Kit Test Internal QC: Positive   (Normal Range: Negative)

## 2010-07-14 NOTE — Assessment & Plan Note (Signed)
Summary: med adjustment/kdc   Vital Signs:  Patient profile:   70 year old male Weight:      208 pounds Temp:     98.3 degrees F oral Pulse rate:   62 / minute Pulse rhythm:   regular BP sitting:   122 / 86  (left arm) Cuff size:   regular  Vitals Entered By: [name] CMA (August 01, 2009 11:07 AM) CC: Discuss paxil, Depressive symptoms   History of Present Illness:  Depressive symptoms      This is a 70 year old man who presents with Depressive symptoms.  The patient reports depressed mood, but denies loss of interest/pleasure, significant weight loss, significant weight gain, insomnia, hypersomnia, psychomotor agitation, and psychomotor retardation.  The patient denies fatigue or loss of energy, feelings of worthlessness, diminished concentration, indecisiveness, thoughts of death, thoughts of suicide, suicidal intent, and suicidal plans.  Patient's past history includes chronic medical illness and depression.  The patient reports the following manic symptoms: abnormally irritable mood.    Allergies: 1)  ! Transpore Plastic (Loss adjuster, chartered)  Past History:  Past medical, surgical, family and social histories (including risk factors) reviewed for relevance to current acute and chronic problems.  Past Medical History: Reviewed history from 12/21/2006 and no changes required. Hyperlipidemia Cerebrovascular accident, hx of Unremarkable Anxiety  Past Surgical History: Reviewed history from 12/21/2006 and no changes required. Hemorrhoidectomy (11/16/2006)  Family History: Reviewed history from 05/30/2009 and no changes required. Family History of CAD Male 1st degree relative 72 Family History of CAD Male 1st degree relative 68yo Family History Depression-- mother bipolar  Social History: Reviewed history from 05/30/2009 and no changes required. nonsmoker occ etoh retired Married  Review of Systems      See HPI  Physical Exam  General:   Well-developed,well-nourished,in no acute distress; alert,appropriate and cooperative throughout examination Psych:  Oriented X3, good eye contact, depressed affect, and flat affect.     Impression & Recommendations:  Problem # 1:  DEPRESSION/ANXIETY (ICD-300.4) lexapro 10 mg 1 by mouth once daily  abilify 2mg  1 by mouth once daily  xanax 0.25 1 by mouth three times a day as needed  rto 1 month  Complete Medication List: 1)  Bayer Aspirin 325 Mg Tabs (Aspirin) .[name].. 1 once daily 2)  Tricor 145 Mg Tabs (Fenofibrate) .[name].. 1 once daily 3)  Flomax 0.4 Mg Cp24 (Tamsulosin hcl) .[name].. 1 once daily 4)  Crestor 40 Mg Tabs (Rosuvastatin calcium) .[name].. 1 by mouth at bedtime 5)  Nexium 40 Mg Cpdr (Esomeprazole magnesium) .[name].. 1 by mouth once daily 6)  Multivitamins Caps (Multiple vitamin) .... Take one tablet daily. 7)  B Complex Vitamins Caps (B complex vitamins) .... Take 1 tab once daily 8)  Vitamin D3 2000 Unit Caps (Cholecalciferol) .[name].. 1 by mouth daily 9)  Calcium 1200-1000 Mg-unit Chew (Calcium carbonate-vit d-min) .[name].. 1 by mouth daily 10)  Abilify 2 Mg Tabs (Aripiprazole) .[name].. 1 by mouth qam 11)  Alprazolam 0.25 Mg Tabs (Alprazolam) .[name].. 1 by mouth three times a day 12)  Lexapro 10 Mg Tabs (Escitalopram oxalate) .[name].. 1 by mouth once daily Prescriptions: LEXAPRO 10 MG TABS (ESCITALOPRAM OXALATE) 1 by mouth once daily  #30 x 5   Entered and Authorized by:   Garnet Koyanagi DO   Signed by:   Garnet Koyanagi DO on 08/01/2009   Method used:   Print then Give to Patient   RxID:   [id number] ALPRAZOLAM 0.25 MG TABS (ALPRAZOLAM) 1 by mouth  three times a day  #30 x 0   Entered and Authorized by:   Garnet Koyanagi DO   Signed by:   Garnet Koyanagi DO on 08/01/2009   Method used:   Print then Give to Patient   RxID:   [id number] ABILIFY 2 MG TABS (ARIPIPRAZOLE) 1 by mouth qam  #30 x 2   Entered and Authorized by:   Garnet Koyanagi DO   Signed by:   Garnet Koyanagi DO on 08/01/2009   Method used:    Print then Give to Patient   RxID:   [id number] PROZAC 40 MG CAPS (FLUOXETINE HCL) 1 by mouth once daily  #30 x 5   Entered and Authorized by:   Garnet Koyanagi DO   Signed by:   Garnet Koyanagi DO on 08/01/2009   Method used:   Electronically to        [facility]. # [id number]* (retail)       [address]       [place], [place]  [address]       Ph: [phone] or [phone]       Fax: [phone]   RxID:   [id number]

## 2010-07-14 NOTE — Letter (Signed)
Summary: [facility]  [facility]   Imported By: Edmonia James 09/12/2009 12:10:29  _____________________________________________________________________  External Attachment:    Type:   Image     Comment:   External Document

## 2010-07-14 NOTE — Consult Note (Signed)
Summary: [facility]   Imported By: Edmonia James 01/13/2010 13:45:51  _____________________________________________________________________  External Attachment:    Type:   Image     Comment:   External Document

## 2010-07-14 NOTE — Progress Notes (Signed)
Summary: pt needs lab appt - letter mailed  Phone Note Refill Request Message from:  Fax from Pharmacy on February 02, 2010 12:37 PM  Refills Requested: Medication #1:  TRICOR 145 MG  TABS 1 once daily [facility] - fax [phone]  Initial call taken by: Arbie Coo[place],  February 02, 2010 12:38 PM  Follow-up for Phone Call        Pt needs labwork:  272.4  lipid, hep  790.6  bmp, hgba1c Follow-up by: [name] CMA,  February 02, 2010 4:16 PM  Additional Follow-up for Phone Call Additional follow up Details #1::        letter mailed to patient to schedule lab appt .Marland KitchenArbie Cookey Spring  February 03, 2010 9:10 AM     Prescriptions: TRICOR 145 MG  TABS (FENOFIBRATE) 1 once daily  #30 x 0   Entered by:   [name] CMA   Authorized by:   Garnet Koyanagi DO   Signed by:   [name] CMA on 02/02/2010   Method used:   Electronically to        [facility] (retail)       [address]       [place]       [place], [place]  [address]       Ph: [phone]       Fax: [phone]   RxID:   [id number]

## 2010-07-14 NOTE — Progress Notes (Signed)
Summary: med decrease  Phone Note Call from Patient Call back at [phone]   Caller: Spouse (susan0 Summary of Call: Pt wife left VM that pt was recently increase on abilify and since has not been in the best of mood. Pt wife would like to know if it would be ok to cut dose in half or does pt need to come in to see you before med can be adjusted.Pt wife states that she will be leaving work in a little bit so if unable to contact today ok to call tomorrow. pls advise .........Marland KitchenFelecia Deloach CMA  September 24, 2009 4:48 PM   left message informing pt wife dr Etter Sjogren out of office until AM will forward to dr Etter Sjogren and give her return call in am, but any further concern to given me a call back extension left...Marland KitchenMarland KitchenFelecia Deloach CMA  September 24, 2009 4:51 PM   Follow-up for Phone Call        ok to cut in half=--- I'll change on med list Follow-up by: Garnet Koyanagi DO,  September 25, 2009 8:22 AM  Additional Follow-up for Phone Call Additional follow up Details #1::        Left message for pts wife. [name] CMA  September 25, 2009 8:34 AM     Additional Follow-up for Phone Call Additional follow up Details #2::    Pts wife is aware. [place]  September 25, 2009 8:38 AM She states that he is urinating more than normal. Pt has an appt today. [place]  September 25, 2009 8:42 AM   New/Updated Medications: ABILIFY 5 MG TABS (ARIPIPRAZOLE) 1/2 by mouth once daily

## 2010-07-14 NOTE — Assessment & Plan Note (Signed)
Summary: Possible reaction to med/drb   Vital Signs:  Patient profile:   70 year old male Weight:      212.0 pounds Temp:     98.1 degrees F oral Pulse rate:   72 / minute BP sitting:   140 / 82  (left arm) Cuff size:   large  Vitals Entered By: Georgette Dover (September 25, 2009 3:41 PM) CC: ? Reaction to med  Comments REVIEWED MED LIST, PATIENT AGREED DOSE AND INSTRUCTION CORRECT    History of Present Illness: Pt here c/o urinary frequency --pt is on flomax but symptoms worsened recently. Pt also c/o abilify making him tired and he wants to decrease dose.  No other complaints.    Current Medications (verified): 1)  Bayer Aspirin 325 Mg  Tabs (Aspirin) .[name].. 1 Once Daily 2)  Tricor 145 Mg  Tabs (Fenofibrate) .[name].. 1 Once Daily 3)  Flomax 0.4 Mg  Cp24 (Tamsulosin Hcl) .[name].. 1 Once Daily 4)  Crestor 40 Mg  Tabs (Rosuvastatin Calcium) .[name].. 1 By Mouth At Bedtime 5)  Nexium 40 Mg Cpdr (Esomeprazole Magnesium) .[name].. 1 By Mouth Once Daily 6)  Multivitamins   Caps (Multiple Vitamin) .... Take One Tablet Daily. 7)  B Complex Vitamins  Caps (B Complex Vitamins) .... Take 1 Tab Once Daily 8)  Vitamin D3 2000 Unit Caps (Cholecalciferol) .[name].. 1 By Mouth Daily 9)  Calcium 1200-1000 Mg-Unit Chew (Calcium Carbonate-Vit D-Min) .[name].. 1 By Mouth Daily 10)  Abilify 2 Mg Tabs (Aripiprazole) .[name].. 1 By Mouth Once Daily 11)  Alprazolam 0.25 Mg Tabs (Alprazolam) .[name].. 1 By Mouth Three Times A Day As Needed 12)  Lexapro 20 Mg Tabs (Escitalopram Oxalate) .[name].. 1 By Mouth Once Daily 13)  Soma 250 Mg Tabs (Carisoprodol)  Allergies: 1)  ! Transpore Plastic (Loss adjuster, chartered)  Past History:  Past medical, surgical, family and social histories (including risk factors) reviewed for relevance to current acute and chronic problems.  Past Medical History: Reviewed history from 12/21/2006 and no changes required. Hyperlipidemia Cerebrovascular accident, hx of Unremarkable Anxiety  Past Surgical History: Reviewed  history from 12/21/2006 and no changes required. Hemorrhoidectomy (11/16/2006)  Family History: Reviewed history from 05/30/2009 and no changes required. Family History of CAD Male 1st degree relative 33 Family History of CAD Male 1st degree relative 68yo Family History Depression-- mother bipolar  Social History: Reviewed history from 05/30/2009 and no changes required. nonsmoker occ etoh retired Married  Review of Systems      See HPI  Physical Exam  General:  Well-developed,well-nourished,in no acute distress; alert,appropriate and cooperative throughout examination Psych:  Oriented X3, normally interactive, good eye contact, not anxious appearing, and not depressed appearing.     Impression & Recommendations:  Problem # 1:  DEPRESSION/ANXIETY (ICD-300.4) decrease abilify to 2 mg  con't lexapro  rto 3 months  Problem # 2:  URINARY FREQUENCY (ICD-788.41) f/u urology His updated medication list for this problem includes:    Flomax 0.4 Mg Cp24 (Tamsulosin hcl) .[name]... 1 once daily  Orders: UA Dipstick w/o Micro (manual) (81002) Glucose, (CBG) [id number])  Discussed use of medication.   Complete Medication List: 1)  Bayer Aspirin 325 Mg Tabs (Aspirin) .[name].. 1 once daily 2)  Tricor 145 Mg Tabs (Fenofibrate) .[name].. 1 once daily 3)  Flomax 0.4 Mg Cp24 (Tamsulosin hcl) .[name].. 1 once daily 4)  Crestor 40 Mg Tabs (Rosuvastatin calcium) .[name].. 1 by mouth at bedtime 5)  Nexium 40 Mg Cpdr (Esomeprazole magnesium) .[name].. 1 by mouth once daily  6)  Multivitamins Caps (Multiple vitamin) .... Take one tablet daily. 7)  B Complex Vitamins Caps (B complex vitamins) .... Take 1 tab once daily 8)  Vitamin D3 2000 Unit Caps (Cholecalciferol) .[name].. 1 by mouth daily 9)  Calcium 1200-1000 Mg-unit Chew (Calcium carbonate-vit d-min) .[name].. 1 by mouth daily 10)  Abilify 2 Mg Tabs (Aripiprazole) .[name].. 1 by mouth once daily 11)  Alprazolam 0.25 Mg Tabs (Alprazolam) .[name].. 1 by mouth three times a day as  needed 12)  Lexapro 20 Mg Tabs (Escitalopram oxalate) .[name].. 1 by mouth once daily 13)  Soma 250 Mg Tabs (Carisoprodol) Prescriptions: ABILIFY 2 MG TABS (ARIPIPRAZOLE) 1 by mouth once daily  #90 x 3   Entered and Authorized by:   Garnet Koyanagi DO   Signed by:   Garnet Koyanagi DO on 09/25/2009   Method used:   Print then Give to Patient   RxID:   [id number]   Laboratory Results   Urine Tests    Routine Urinalysis   Color: lt. yellow Appearance: Clear Glucose: negative   (Normal Range: Negative) Bilirubin: negative   (Normal Range: Negative) Ketone: negative   (Normal Range: Negative) Spec. Gravity: 1.010   (Normal Range: 1.003-1.035) Blood: negative   (Normal Range: Negative) pH: 7.5   (Normal Range: 5.0-8.0) Protein: negative   (Normal Range: Negative) Urobilinogen: 0.2   (Normal Range: 0-1) Nitrite: negative   (Normal Range: Negative) Leukocyte Esterace: negative   (Normal Range: Negative)     Blood Tests     Glucose (random): 94 mg/dL   (Normal Range: 70-105)  Comments: Last Meal-Breakfast (9am)

## 2010-07-14 NOTE — Progress Notes (Signed)
Summary: REFILL REQUEST  Phone Note Refill Request Message from:  Pharmacy on March 09, 2010 8:38 AM  Refills Requested: Medication #1:  TRICOR 145 MG  TABS 1 once daily   Dosage confirmed as above?Dosage Confirmed   Supply Requested: 1 month   Last Refilled: 02/02/2010 [facility]  Next Appointment Scheduled: NONE Initial call taken by: Osborn Coho,  March 09, 2010 8:39 AM    Prescriptions: TRICOR 145 MG  TABS (FENOFIBRATE) 1 once daily  #30 x 2   Entered by:   Aron Baba CMA ([place])   Authorized by:   Garnet Koyanagi DO   Signed by:   Aron Baba CMA ([place]) on 03/09/2010   Method used:   Electronically to        [facility] (retail)       [address]       [place]       [place], [place]  [address]       Ph: [phone]       Fax: [phone]   RxID:   [id number]

## 2010-07-31 ENCOUNTER — Encounter: Payer: Self-pay | Admitting: Family Medicine

## 2010-07-31 ENCOUNTER — Ambulatory Visit (HOSPITAL_BASED_OUTPATIENT_CLINIC_OR_DEPARTMENT_OTHER)
Admission: RE | Admit: 2010-07-31 | Discharge: 2010-07-31 | Disposition: A | Discharge status: Home / Self Care | Payer: Commercial Managed Care - PPO | Source: Ambulatory Visit | Attending: Family Medicine | Admitting: Family Medicine

## 2010-07-31 ENCOUNTER — Other Ambulatory Visit: Payer: Self-pay | Admitting: Family Medicine

## 2010-07-31 ENCOUNTER — Ambulatory Visit (INDEPENDENT_AMBULATORY_CARE_PROVIDER_SITE_OTHER): Payer: Commercial Managed Care - PPO | Admitting: Family Medicine

## 2010-07-31 DIAGNOSIS — E785 Hyperlipidemia, unspecified: Secondary | ICD-10-CM

## 2010-07-31 DIAGNOSIS — IMO0002 Reserved for concepts with insufficient information to code with codable children: Secondary | ICD-10-CM

## 2010-07-31 DIAGNOSIS — S335XXA Sprain of ligaments of lumbar spine, initial encounter: Secondary | ICD-10-CM | POA: Insufficient documentation

## 2010-07-31 DIAGNOSIS — M545 Low back pain, unspecified: Secondary | ICD-10-CM | POA: Insufficient documentation

## 2010-07-31 DIAGNOSIS — R7309 Other abnormal glucose: Secondary | ICD-10-CM

## 2010-07-31 DIAGNOSIS — M4120 Other idiopathic scoliosis, site unspecified: Secondary | ICD-10-CM | POA: Insufficient documentation

## 2010-07-31 DIAGNOSIS — M47817 Spondylosis without myelopathy or radiculopathy, lumbosacral region: Secondary | ICD-10-CM | POA: Insufficient documentation

## 2010-07-31 LAB — LIPID PANEL
HDL: 57.3 mg/dL (ref >39.00)
LDL Cholesterol: 59 mg/dL (ref 0–99)
Total CHOL/HDL Ratio: 2
Triglycerides: 47 mg/dL (ref 0.0–149.0)

## 2010-07-31 LAB — BASIC METABOLIC PANEL
Calcium: 9.6 mg/dL (ref 8.4–10.5)
Creatinine, Ser: 1.3 mg/dL (ref 0.4–1.5)
GFR: 56.34 mL/min — ABNORMAL LOW (ref >60.00)

## 2010-07-31 LAB — HEPATIC FUNCTION PANEL
Bilirubin, Direct: 0.1 mg/dL (ref 0.0–0.3)
Total Bilirubin: 0.5 mg/dL (ref 0.3–1.2)

## 2010-08-03 ENCOUNTER — Other Ambulatory Visit: Payer: Self-pay | Admitting: Family Medicine

## 2010-08-03 ENCOUNTER — Telehealth (INDEPENDENT_AMBULATORY_CARE_PROVIDER_SITE_OTHER): Payer: Self-pay | Admitting: *Deleted

## 2010-08-03 DIAGNOSIS — IMO0002 Reserved for concepts with insufficient information to code with codable children: Secondary | ICD-10-CM

## 2010-08-05 NOTE — Assessment & Plan Note (Signed)
Summary: back issue/labs   Vital Signs:  Patient profile:   70 year old male Height:      70 inches Weight:      214.6 pounds BMI:     30.90 Temp:     98.1 degrees F oral BP sitting:   122 / 80  (right arm) Cuff size:   large  Vitals Entered By: Aron Baba CMA Deborra Medina) (July 31, 2010 9:34 AM) CC: c/o recurrent lower back pain that is getting worse x few months, Back Pain   History of Present Illness:       This is a 70 year old man who presents with Back Pain.  The symptoms began >1 year ago.  Last six months pain is getting worse .  The patient denies fever, chills, weakness, loss of sensation, fecal incontinence, urinary incontinence, urinary retention, dysuria, rest pain, inability to work, and inability to care for self.  The pain is located in the mid low back.  The pain began gradually.  The pain is made worse by standing or walking, flexion, and extension.  The pain is made better by inactivity.  Risk factors for serious underlying conditions include duration of pain > 1 month and age >= 50 years.    Current Medications (verified): 1)  Bayer Aspirin 325 Mg  Tabs (Aspirin) .[name].. 1 Once Daily 2)  Tricor 145 Mg  Tabs (Fenofibrate) .[name].. 1 Once Daily 3)  Flomax 0.4 Mg  Cp24 (Tamsulosin Hcl) .[name].. 1 Once Daily 4)  Crestor 40 Mg  Tabs (Rosuvastatin Calcium) .[name].. 1 By Mouth At Bedtime 5)  Nexium 40 Mg Cpdr (Esomeprazole Magnesium) .[name].. 1 By Mouth Once Daily 6)  Multivitamins   Caps (Multiple Vitamin) .... Take One Tablet Daily. 7)  B Complex Vitamins  Caps (B Complex Vitamins) .... Take 1 Tab Once Daily 8)  Vitamin D3 2000 Unit Caps (Cholecalciferol) .[name].. 1 By Mouth Daily 9)  Calcium 1200-1000 Mg-Unit Chew (Calcium Carbonate-Vit D-Min) .[name].. 1 By Mouth Daily 10)  Abilify 5 Mg Tabs (Aripiprazole) .... By Mouth Once Daily 11)  Alprazolam 0.25 Mg Tabs (Alprazolam) .[name].. 1 By Mouth Three Times A Day As Needed 12)  Lexapro 20 Mg Tabs (Escitalopram Oxalate) .[name].. 1 By Mouth Once  Daily  Allergies (verified): 1)  ! Transpore Plastic (Adhesive Tape) PMH-FH-SH reviewed for relevance  Family History: Reviewed history from 05/30/2009 and no changes required. Family History of CAD Male 1st degree relative 32 Family History of CAD Male 1st degree relative 68yo Family History Depression-- mother bipolar  Social History: Reviewed history from 05/30/2009 and no changes required. nonsmoker occ etoh retired Married  Review of Systems      See HPI  Physical Exam  General:  Well-developed,well-nourished,in no acute distress; alert,appropriate and cooperative throughout examination Msk:  no joint tenderness.   Extremities:  No clubbing, cyanosis, edema, or deformity noted with normal full range of motion of all joints.   Neurologic:  strength normal in all extremities, gait normal, and DTRs symmetrical and normal.     Impression & Recommendations:  Problem # 1:  LUMBAR SPRAIN AND STRAIN (ICD-847.2)  Orders: T-Lumbar Spine 2 Views (72100TC)  Complete Medication List: 1)  Bayer Aspirin 325 Mg Tabs (Aspirin) .[name].. 1 once daily 2)  Fenofibrate 160 Mg Tabs (Fenofibrate) .[name].. 1 by mouth once daily 3)  Flomax 0.4 Mg Cp24 (Tamsulosin hcl) .[name].. 1 once daily 4)  Crestor 40 Mg Tabs (Rosuvastatin calcium) .[name].. 1 by mouth at bedtime 5)  Nexium 40  Mg Cpdr (Esomeprazole magnesium) .[name].. 1 by mouth once daily 6)  Multivitamins Caps (Multiple vitamin) .... Take one tablet daily. 7)  B Complex Vitamins Caps (B complex vitamins) .... Take 1 tab once daily 8)  Vitamin D3 2000 Unit Caps (Cholecalciferol) .[name].. 1 by mouth daily 9)  Calcium 1200-1000 Mg-unit Chew (Calcium carbonate-vit d-min) .[name].. 1 by mouth daily 10)  Abilify 10 Mg Tabs (Aripiprazole) .[name].. 1 by mouth once daily 11)  Alprazolam 0.25 Mg Tabs (Alprazolam) .[name].. 1 by mouth three times a day as needed 12)  Lexapro 20 Mg Tabs (Escitalopram oxalate) .[name].. 1 by mouth once daily 13)  Ultram 50 Mg Tabs (Tramadol hcl) .[name].. 1  by mouth every 6 hours 14)  Flexeril 10 Mg Tabs (Cyclobenzaprine hcl) .[name].. 1 by mouth three times a day prn  Other Orders: Venipuncture [id number]) TLB-Lipid Panel (80061-LIPID) TLB-BMP (Basic Metabolic Panel-BMET) (99991111) TLB-Hepatic/Liver Function Pnl (80076-HEPATIC) TLB-A1C / Hgb A1C (Glycohemoglobin) (83036-A1C)  Patient Instructions: 1)  Most patients (90%) with low back pain will improve with time ( 2-6 weeks). Keep active but avoid activities that are painful. Apply moist heat and/or ice to lower back several times a day.  2)  Please schedule a follow-up appointment in 1 month.  Prescriptions: ABILIFY 10 MG TABS (ARIPIPRAZOLE) 1 by mouth once daily  #90 x 3   Entered and Authorized by:   Garnet Koyanagi DO   Signed by:   Garnet Koyanagi DO on 07/31/2010   Method used:   Print then Give to Patient   RxID:   [id number] FENOFIBRATE 160 MG TABS (FENOFIBRATE) 1 by mouth once daily  #90 x 3   Entered and Authorized by:   Garnet Koyanagi DO   Signed by:   Garnet Koyanagi DO on 07/31/2010   Method used:   Print then Give to Patient   RxID:   [id number] FLEXERIL 10 MG TABS (CYCLOBENZAPRINE HCL) 1 by mouth three times a day prn  #30 x 0   Entered and Authorized by:   Garnet Koyanagi DO   Signed by:   Garnet Koyanagi DO on 07/31/2010   Method used:   Print then Give to Patient   RxID:   [id number] ULTRAM 50 MG TABS (TRAMADOL HCL) 1 by mouth every 6 hours  #30 x 0   Entered and Authorized by:   Garnet Koyanagi DO   Signed by:   Garnet Koyanagi DO on 07/31/2010   Method used:   Print then Give to Patient   RxID:   [id number]    Orders Added: 1)  T-Lumbar Spine 2 Views [72100TC] 2)  Venipuncture [id number] 3)  TLB-Lipid Panel [80061-LIPID] 4)  TLB-BMP (Basic Metabolic Panel-BMET) [id number] 5)  TLB-Hepatic/Liver Function Pnl [80076-HEPATIC] 6)  TLB-A1C / Hgb A1C (Glycohemoglobin) [83036-A1C] 7)  Est. Patient Level III [id number]

## 2010-08-06 ENCOUNTER — Ambulatory Visit
Admission: RE | Admit: 2010-08-06 | Discharge: 2010-08-06 | Disposition: A | Discharge status: Home / Self Care | Payer: Commercial Managed Care - PPO | Source: Ambulatory Visit | Attending: Family Medicine | Admitting: Family Medicine

## 2010-08-06 DIAGNOSIS — IMO0002 Reserved for concepts with insufficient information to code with codable children: Secondary | ICD-10-CM

## 2010-08-11 NOTE — Progress Notes (Signed)
Summary: refill  Phone Note Refill Request Message from:  Fax from Pharmacy on August 03, 2010 8:24 AM  Refills Requested: Medication #1:  CRESTOR 40 MG  TABS 1 by mouth at bedtime [place] out[facility]atient pharmacy - fax [phone]  Initial call taken by: Arbie [place],  August 03, 2010 8:25 AM    Prescriptions: CRESTOR 40 MG  TABS (ROSUVASTATIN CALCIUM) 1 by mouth at bedtime  #90 Tablet x 0   Entered by:   Aron Baba CMA ([place])   Authorized by:   Garnet Koyanagi DO   Signed by:   Aron Baba CMA ([place]) on 08/03/2010   Method used:   Faxed to ...       [facility] (retail)       [address]       [place], [place]  [address]       Ph: [phone]       Fax: [phone]   RxID:   [id number]

## 2010-09-01 LAB — POCT RAPID STREP A (OFFICE): Streptococcus, Group A Screen (Direct): NEGATIVE

## 2010-09-21 LAB — CBC
HCT: 33.7 % — ABNORMAL LOW (ref 39.0–52.0)
MCV: 89.8 fL (ref 78.0–100.0)
RBC: 3.75 MIL/uL — ABNORMAL LOW (ref 4.22–5.81)
WBC: 9.9 10*3/uL (ref 4.0–10.5)

## 2010-09-21 LAB — BASIC METABOLIC PANEL
Chloride: 108 mEq/L (ref 96–112)
GFR calc Af Amer: >60 mL/min (ref >60)
Potassium: 3.9 mEq/L (ref 3.5–5.1)
Sodium: 141 mEq/L (ref 135–145)

## 2010-09-22 LAB — BLOOD GAS, ARTERIAL
Acid-base deficit: 3.4 mmol/L — ABNORMAL HIGH (ref 0.0–2.0)
Drawn by: 181601
TCO2: 21 mmol/L (ref 0–100)
pCO2 arterial: 30.5 mmHg — ABNORMAL LOW (ref 35.0–45.0)
pO2, Arterial: 102 mmHg — ABNORMAL HIGH (ref 80.0–100.0)

## 2010-09-22 LAB — URINALYSIS, ROUTINE W REFLEX MICROSCOPIC
Bilirubin Urine: NEGATIVE
Glucose, UA: NEGATIVE mg/dL
Glucose, UA: NEGATIVE mg/dL
Hgb urine dipstick: NEGATIVE
Hgb urine dipstick: NEGATIVE
Ketones, ur: NEGATIVE mg/dL
Protein, ur: NEGATIVE mg/dL
Specific Gravity, Urine: 1.022 (ref 1.005–1.030)
pH: 6 (ref 5.0–8.0)
pH: 6.5 (ref 5.0–8.0)

## 2010-09-22 LAB — CBC
Hemoglobin: 14.6 g/dL (ref 13.0–17.0)
MCV: 89.4 fL (ref 78.0–100.0)
RBC: 4.48 MIL/uL (ref 4.22–5.81)
RBC: 4.76 MIL/uL (ref 4.22–5.81)
WBC: 8.7 10*3/uL (ref 4.0–10.5)
WBC: 9 10*3/uL (ref 4.0–10.5)

## 2010-09-22 LAB — COMPREHENSIVE METABOLIC PANEL
ALT: 24 U/L (ref 0–53)
ALT: 30 U/L (ref 0–53)
AST: 26 U/L (ref 0–37)
AST: 32 U/L (ref 0–37)
Alkaline Phosphatase: 59 U/L (ref 39–117)
Alkaline Phosphatase: 59 U/L (ref 39–117)
CO2: 19 mEq/L (ref 19–32)
CO2: 24 mEq/L (ref 19–32)
Calcium: 10 mg/dL (ref 8.4–10.5)
Chloride: 110 mEq/L (ref 96–112)
Creatinine, Ser: 1.29 mg/dL (ref 0.4–1.5)
GFR calc Af Amer: 60 mL/min — ABNORMAL LOW (ref >60)
GFR calc Af Amer: >60 mL/min (ref >60)
GFR calc non Af Amer: 49 mL/min — ABNORMAL LOW (ref >60)
GFR calc non Af Amer: 56 mL/min — ABNORMAL LOW (ref >60)
Glucose, Bld: 101 mg/dL — ABNORMAL HIGH (ref 70–99)
Potassium: 4.3 mEq/L (ref 3.5–5.1)
Potassium: 4.5 mEq/L (ref 3.5–5.1)
Sodium: 138 mEq/L (ref 135–145)
Total Bilirubin: 0.7 mg/dL (ref 0.3–1.2)

## 2010-09-22 LAB — TYPE AND SCREEN
ABO/RH(D): A POS
Antibody Screen: NEGATIVE

## 2010-09-22 LAB — APTT: aPTT: 26 seconds (ref 24–37)

## 2010-09-22 LAB — ABO/RH: ABO/RH(D): A POS

## 2010-09-22 LAB — PROTIME-INR: Prothrombin Time: 13.4 seconds (ref 11.6–15.2)

## 2010-09-23 LAB — BUN: BUN: 16 mg/dL (ref 6–23)

## 2010-09-23 LAB — CREATININE, SERUM: GFR calc Af Amer: >60 mL/min (ref >60)

## 2010-09-28 ENCOUNTER — Other Ambulatory Visit: Payer: Self-pay | Admitting: Family Medicine

## 2010-09-28 NOTE — Telephone Encounter (Signed)
If we gave him 81 with 3 refills in Feb--- shouldn't he have refills.?

## 2010-09-28 NOTE — Telephone Encounter (Signed)
Last seen 07/31/10 and filled 08/29/09 #90 with 3 refills.     Please advise    KP

## 2010-10-19 ENCOUNTER — Other Ambulatory Visit: Payer: Self-pay | Admitting: Family Medicine

## 2010-10-27 NOTE — Assessment & Plan Note (Signed)
OFFICE VISIT   William [name], William [name]  DOB:  March 26, 1941                                       10/10/2008  [id number]   The patient returned the office today in order to undergo a CT angiogram  of the neck.  This does verify severe stenosis left internal carotid  artery.  Velocities by Doppler measure 345/101 cm per second.  Plan at  this time is to pursue left carotid endarterectomy.  This is scheduled  for [facility] on Nov 01, 2008.   P. Drucie Opitz, M.D.  Electronically Signed   PGH/MEDQ  D:  10/10/2008  T:  10/11/2008  Job:  [id number]   cc:   Rosalita Chessman, DO  Pramod P. Leonie Man, MD

## 2010-10-27 NOTE — Op Note (Signed)
NAME:  JAWAAN, LANFEAR NO.:  [id number]   MEDICAL RECORD NO.:  [id number]          PATIENT TYPE:  INP   LOCATION:  [id number]                         FACILITY:  [facility]   PHYSICIAN:  Dorothea Glassman, M.D.    DATE OF BIRTH:  [birth year]   DATE OF PROCEDURE:  11/12/2008  DATE OF DISCHARGE:                               OPERATIVE REPORT   SURGEON:  Dorothea Glassman, MD   ASSISTANT:  Jacinta Shoe, PA   ANESTHETIC:  General endotracheal.   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   PROCEDURE:  Left carotid endarterectomy with Dacron patch angioplasty.   CLINICAL NOTE:  Mr. Fawcett is a 70 year old gentleman with history of  right carotid endarterectomy carried out at [facility].  He is  followed with serial Dopplers at the [facility] office, recent Doppler revealed  marked progression of the left internal carotid artery stenosis.  He was  seen and evaluated.  CT angiogram verified a severe left internal  carotid artery stenosis.  Brought to the operating room at this time for  left carotid endarterectomy.  Potential risks of the operative procedure  reviewed with the patient and his wife prior to surgery.  These include  but are not limited to MI, CVA, cranial nerve injury, and death.   OPERATIVE PROCEDURE:  The patient was brought to the operating room in  stable condition.  Placed under general endotracheal anesthesia.  Arterial line in place.  Foley catheter was inserted.  Left neck was  prepped and draped in a sterile fashion in the supine position.   A curvilinear skin incision was made along the anterior border of the  left sternomastoid muscle.  Subcutaneous tissue was divided with  electrocautery.  Platysma was divided.  Deep dissection was carried down  to expose facial vein.  There were several of these and were ligated  with 3-0  silk and divided.  This exposed carotid bifurcation.  Vagus  nerve was reflected posteriorly and preserved.  The common carotid  artery was mobilized down the omohyoid muscle and encircled with vessel  loop.  Origin of the superior thyroid and external carotid was freed and  encircled with vessel loop.  The internal carotid artery followed  distally up to the posterior belly of the digastric muscle and encircled  with vessel loop.  The hypoglossal nerve was identified and reflected  superiorly.   Evaluation of the carotid bifurcation revealed plaque extending from the  bulb approximately 2 cm into the left internal carotid artery origin.  The distal vessel was soft.  The patient was administered with 7000  units of heparin intravenously.  Adequate circulation time permitted.   The carotid vessel was then controlled with clamps.  Longitudinal  arteriotomy was made in the distal common carotid artery.  This revealed  an ulcerated plaque in the bulb.  Extended distally into the internal  carotid artery where there was friable plaque and high-grade stenosis.  The arteriotomy extended beyond the plaque disease.  The shunt was then  inserted.   Using an endarterectomy elevator, plaque was removed.  The  endarterectomy carried down to the common carotid artery where the  plaque was divided transversely with Potts scissors.  Plaque raised up  into the bulb.  The superior thyroid and external carotid were  endarterectomized using an eversion technique.  The distal internal  carotid artery plaque feathered out well.  Fragments of plaque removed  with fine forceps.  The site was irrigated with heparin saline solution.   A patch angioplasty and endarterectomy site was then carried out with a  running 6-0 Prolene suture using a Finesse Dacron patch.  At the  completion of the patch angioplasty, the shunt was removed.  All vessels  were well flushed.  Clamps were removed directing initial antegrade  flow  up the external carotid artery, following this internal carotid was  released.   There was an excellent pulse and Doppler signal in the distal internal  carotid artery.   Adequate hemostasis was obtained.  Sponges and instrument counts were  correct.  The patient was administered with 50 mg of protamine  intravenously.   Sternomastoid fascia was closed with running 2-0 Vicryl suture.  Platysma was closed with running 3-0 Vicryl suture.  Skin was closed  with 4-0 Monocryl.  Dermabond was applied.   Anesthesia reversed in the operating room.  The patient was awakened  readily, moved all extremities to command.  Transferred to recovery room  in stable condition.      Dorothea Glassman, M.D.  Electronically Signed     PGH/MEDQ  D:  11/12/2008  T:  11/12/2008  Job:  [id number]

## 2010-10-27 NOTE — Assessment & Plan Note (Signed)
OFFICE VISIT   [name], [name]  DOB:  [birth year]                                       12/05/2008  [id number]   The patient underwent a left carotid endarterectomy for severe stenosis  on 11/01/2008.  Discharged home postop day #1 in good condition.  Since  discharge doing generally well.  No major complaints.   Left neck incision healing well.  BP 129/79, pulse 88 per minute.  Cranial nerves intact.  Strength equal bilaterally.   The patient is doing well following his recent left carotid surgery.  We  will plan followup again in 6 months with carotid Doppler.   Dorothea Glassman, M.D.  Electronically Signed   PGH/MEDQ  D:  12/05/2008  T:  12/06/2008  Job:  [id number]   cc:   Rosalita Chessman, DO  Pramod P. Leonie Man, MD

## 2010-10-27 NOTE — Procedures (Signed)
CAROTID DUPLEX EXAM   INDICATION:  Follow up known carotid artery disease.   HISTORY:  Diabetes:  No.  Cardiac:  No.  Hypertension:  No.  Smoking:  No.  Previous Surgery:  Right carotid endarterectomy.  CV History:  CVA in 2003.  Amaurosis Fugax No, Paresthesias No, Hemiparesis No                                       RIGHT             LEFT  Brachial systolic pressure:         160               150  Brachial Doppler waveforms:         Biphasic          Biphasic  Vertebral direction of flow:        Antegrade         Antegrade  DUPLEX VELOCITIES (cm/sec)  CCA peak systolic                   54                70  ECA peak systolic                   70                77  ICA peak systolic                   60                [id number]  ICA end diastolic                   8                 84  PLAQUE MORPHOLOGY:                  None              Heterogenous  PLAQUE AMOUNT:                      None              Moderate  PLAQUE LOCATION:                    None              ICA   IMPRESSION:  1. 60-79% stenosis noted in the left internal carotid artery.  2. Normal carotid duplex noted in the right internal carotid artery,      status post right carotid endarterectomy.  3. Antegrade bilateral vertebral arteries.   ___________________________________________  P. Drucie Opitz, M.D.   MG/MEDQ  D:  05/04/2007  T:  05/05/2007  Job:  [id number]

## 2010-10-27 NOTE — Procedures (Signed)
CAROTID DUPLEX EXAM   INDICATION:  Followup bilateral carotid endarterectomies.   HISTORY:  Diabetes:  No.  Cardiac:  No.  Hypertension:  No.  Smoking:  No.  Previous Surgery:  Left carotid endarterectomy in June 2010, right  carotid endarterectomy in 2006.  CV History:  Currently asymptomatic.  Amaurosis Fugax No, Paresthesias No, Hemiparesis No                                       RIGHT             LEFT  Brachial systolic pressure:         122               120  Brachial Doppler waveforms:         Normal            Normal  Vertebral direction of flow:        Antegrade         Antegrade  DUPLEX VELOCITIES (cm/sec)  CCA peak systolic                   84                88  ECA peak systolic                   74                94  ICA peak systolic                   50                65  ICA end diastolic                   10                24  PLAQUE MORPHOLOGY:  PLAQUE AMOUNT:                      None              None.  PLAQUE LOCATION:   IMPRESSION:  1. Patent bilateral carotid endarterectomy sites with no evidence of      stenosis in the bilateral carotid arteries.  2. No significant change in Doppler velocities noted when compared to      the previous exam on 06/02/2009.   ___________________________________________  Jessy Oto. Fields, MD   CH/MEDQ  D:  12/11/2009  T:  12/11/2009  Job:  [id number]

## 2010-10-27 NOTE — Procedures (Signed)
CAROTID DUPLEX EXAM   INDICATION:  Post left carotid endarterectomy.   HISTORY:  Diabetes:  No  Cardiac:  No  Hypertension:  No  Smoking:  No  Previous Surgery:  Left carotid endarterectomy in June 2010, right  carotid endarterectomy 2006 [facility]).  CV History:  Previous stroke.  Amaurosis Fugax No, Paresthesias No, Hemiparesis No                                       RIGHT               LEFT  Brachial systolic pressure:         180                 180  Brachial Doppler waveforms:         triphasic           triphasic  Vertebral direction of flow:        antegrade/diminished                   antegrade/diminished  DUPLEX VELOCITIES (cm/sec)  CCA peak systolic                   65                  78  ECA peak systolic                   39                  74  ICA peak systolic                   35                  67  ICA end diastolic                   0.09                32  PLAQUE MORPHOLOGY:                  mixed               mixed  PLAQUE AMOUNT:                      mild                mild  PLAQUE LOCATION:                    ECA, bulb           bifurcation,  ECA, ICA   IMPRESSION:  Patient bilateral internal carotid arteries with no  evidence of stenosis.    ___________________________________________  Jessy Oto Fields, MD   CJ/MEDQ  D:  06/02/2009  T:  06/03/2009  Job:  [id number]

## 2010-10-27 NOTE — Assessment & Plan Note (Signed)
OFFICE VISIT   ORLEAN, ARESCO  DOB:  Mar 06, 1941                                       10/03/2008  [id number]   The patient returned to the office today for the first time in  approximately 18 months.  He has a history of a right carotid  endarterectomy carried out for a non-disabling stroke in 2006.  This was  carried out at [facility].  He has been followed in this office  with known asymptomatic left carotid occlusive disease.   Follow-up carotid Doppler carried out at this time reveals marked  progression in his left carotid stenosis with velocities 345/101 cm/s,  these are approaching the higher range of 60% to 79% stenosis with  irregular plaque noted.  He has been afebrile.  Denies sensory, motor or  visual changes.  No speech problems.   The patient appears well.  He has a soft left carotid bruit.  Cranial  nerves intact.  Strength equal bilaterally.  BP is 135/85, pulse is 71  per minute.   Due to these recent changes, I have ordered a CT angiogram of the neck  to further evaluate this left carotid stenosis.  He will return to the  office to review these results and make a decision regarding final  treatment.   Dorothea Glassman, M.D.  Electronically Signed   PGH/MEDQ  D:  10/03/2008  T:  10/04/2008  Job:  [id number]   cc:   Rosalita Chessman, DO  Pramod P. Leonie Man, MD

## 2010-10-27 NOTE — Procedures (Signed)
CAROTID DUPLEX EXAM   INDICATION:  Carotid disease.   HISTORY:  Diabetes:  No.  Cardiac:  No.  Hypertension:  No.  Smoking:  No.  Previous Surgery:  Right carotid endarterectomy in 2006 at [facility].  CV History:  History of right hemispheric CVA.  Amaurosis Fugax No, Paresthesias No, Hemiparesis No.                                       RIGHT             LEFT  Brachial systolic pressure:         142               140  Brachial Doppler waveforms:         Normal            Normal  Vertebral direction of flow:        Antegrade         Antegrade  DUPLEX VELOCITIES (cm/sec)  CCA peak systolic                   92                123456  ECA peak systolic                   76                89  ICA peak systolic                   39                123456  ICA end diastolic                   10                101  PLAQUE MORPHOLOGY:                                    Mixed/irregular  PLAQUE AMOUNT:                      None              Moderate/severe  PLAQUE LOCATION:                                      ICA/ECA   IMPRESSION:  1. Patent right carotid endarterectomy site with no evidence of      stenosis.  2. High-end 60% to 79% stenosis of the left internal carotid artery      with irregular plaque noted.  3. Significant increase in Doppler velocities of the left internal      carotid artery noted when compared to the previous exam on      05/04/2007, with the right ICA remaining stable.   ___________________________________________  P. Drucie Opitz, M.D.   CH/MEDQ  D:  10/03/2008  T:  10/03/2008  Job:  [id number]

## 2010-10-27 NOTE — Discharge Summary (Signed)
NAME:  William [name], William [name]                ACCOUNT NO.:  [id number]   MEDICAL RECORD NO.:  [id number]          PATIENT TYPE:  INP   LOCATION:  [id number]                         FACILITY:  [facility]   PHYSICIAN:  Dorothea Glassman, M.D.    DATE OF BIRTH:  12/09/1940   DATE OF ADMISSION:  11/12/2008  DATE OF DISCHARGE:  11/13/2008                               DISCHARGE SUMMARY   FINAL DISCHARGE DIAGNOSES:  1. Carotid occlusive disease, left.  2. Dyslipidemia.  3. Hypertension.  4. Benign prostatic hypertrophy.   PROCEDURE PERFORMED:  Left carotid endarterectomy.   COMPLICATIONS:  None.   CONDITION AT DISCHARGE:  Stable, improving.   DISCHARGE MEDICATIONS:  He is instructed to resume all previous  medications consisting of:  1. TriCor 145 mg p.o. daily.  2. Flomax 0.4 mg p.o. daily.  3. Paroxetine 30 mg p.o. daily.  4. Aspirin 325 mg p.o. daily.  5. Flexseed oil 4 capsules p.o. daily.  6. Crestor 40 mg p.o. daily.  7. Multivitamin p.o. daily.  8. Calcium 1200 mg 2 tablets p.o. daily.  9. Vitamin D 2000 units p.o. daily.   He is given prescriptions for Percocet 5/325 one p.o. q.4 h. p.r.n.  pain, total #30 was given and Pyridium 100 mg p.o. t.i.d. for 2 days.   Brief identifying   DISPOSITION:  He is being discharged home in stable condition following  careful instructions of concerning his wound care and activity level.  He is given a return appointment with Dr. Amedeo Plenty in 2 weeks or follow up.   BRIEF IDENTIFYING STATEMENT:  For complete details, please refer the  typed history and physical.  Briefly, this very pleasant 70 year old  gentleman who was evaluated by Dr. Amedeo Plenty for severe left internal  carotid artery stenosis.  Dr. Amedeo Plenty recommended left carotid  endarterectomy for stroke prevention.  [name] was informed of the  risks and benefits of the procedure and after careful consideration  elected proceed with surgery.   HOSPITAL COURSE:  Preoperative workup was completed as an  outpatient.  He was brought in through same-day surgery on November 12, 2008 and underwent  the aforementioned left carotid endarterectomy.  For complete details,  please refer the typed operative report.  The procedure was without  complications.  He was returned to the [facility]  extubated.  Following stabilization, he was transferred to a bed in a  surgical step-down unit.  He was observed overnight.  The following  morning, he was found to be neurologically intact.  Of note, he did  require replacement of his Foley catheter due to urinary retention.  His  Foley catheter was subsequently removed and he underwent a voiding  trial.  Pyridium was started along with his Flomax.  He did have a  successful voiding trial.  He was felt stable and was subsequently  discharged home.      Chad Cordial, PA      P. Drucie Opitz, M.D.  Electronically Signed    KEL/MEDQ  D:  11/13/2008  T:  11/14/2008  Job:  [id number]   cc:  Dorothea Glassman, M.D.

## 2010-10-30 NOTE — Op Note (Signed)
NAME:  William [name], William [name]                ACCOUNT NO.:  [id number]   MEDICAL RECORD NO.:  [id number]          PATIENT TYPE:  AMB   LOCATION:  [place]                          FACILITY:  [facility]   PHYSICIAN:  Merri Ray. Grandville Silos, M.D.DATE OF BIRTH:  15-Oct-1940   DATE OF PROCEDURE:  11/08/2006  DATE OF DISCHARGE:                               OPERATIVE REPORT   PREOPERATIVE DIAGNOSIS:  Right inguinal hernia.   POSTOPERATIVE DIAGNOSIS:  Right inguinal hernia.   PROCEDURES:  Repair right inguinal hernia with mesh.   SURGEON:  Georganna Skeans, M.D.   ANESTHESIA:  General.   HISTORY OF PRESENT ILLNESS:  Mr. Kuo is a 70 year old gentleman who I  evaluated for symptomatic right inguinal hernia.  He presents for  elective repair today.  He has held his aspirin for five days.   PROCEDURE IN DETAIL:  Informed consent was obtained.  The patient's site  was marked.  He received intravenous antibiotics.  He was brought to the  operating room.  General anesthesia with laryngeal mask airway was  administered by the anesthesia staff.  His abdomen and groins were  prepped and draped in a sterile fashion.  Right inguinal region was  infiltrated with 0.5%Marcaine with epinephrine along the planned line of  incision and out towards the anterior-superior iliac spine for  postoperative pain relief/  A right inguinal incision was made.  Subcutaneous tissue were dissected down through Scarpa fascia revealing  external oblique fascia.  Hemostasis was obtained with Bovie cautery.  The external oblique was quite attenuated due to his hernia.  The  lateral portion was opened up sharply down into the greatly enlarged  ring.  The superior leaflet was dissected free off the transversalis.  The inferior leaflet was dissected free down revealing the shelving edge  of the inguinal ligament.  Cord structures were encircled with a Penrose  drain.  Dissection revealed a moderate sized direct inguinal hernia.  This was  carefully dissected free from the cord structures.  His  ilioinguinal nerve was tightly involved, so to prevent postoperative  neuralgia, this was divided.  The hernia sac was done by violated.  It  was completely free off the cord structures, and it reduced back easily;  though, spontaneously popped back out.  The cord was then dissected,  revealing no evidence of an indirect hernia sac.  A figure-of-eight 2-0  Vicryl suture was placed back in the shelving edge of the inguinal  ligament to the transversalis to keep the direct hernia reduced.  The  repair was then completed with a keyhole polypropylene mesh.  This was  tacked medially to the tissues over the pubic tubercle with zero  Prolene, and it was sutured in a running fashion inferiorly along the  shelving edge of the inguinal ligament with zero Prolene sutures.  It  was then tacked in an interrupted fashion with zero Prolene, first to  the tissues over the pubic tubercle and then along the transversalis  superiorly and extending out laterally.  The two leaflets of the mesh  were rejoined behind the cord structures recreating the ring, and  these  were tacked together and tacked down to the underlying musculature with  interrupted zero Prolene sutures.  The aperture in the mesh admitted the  tip of the fifth digit, and the cord structures remained viable and non-  edematous.  The mesh extended nicely laterally underneath the external  oblique.  Some additional local anesthetic was injected.  The area was  copiously irrigated.  Meticulous hemostasis was ensured.  The small  portion of the external oblique fascia that was left was closed with  interrupted 2-0 Vicryl suture.  Subcutaneous tissues were again  irrigated, and hemostasis was insured.  Scarpa fascia was closed with  interrupted 3-0 Vicryl sutures.  The skin was closed with a running 4-0  Monocryl subcuticular stitch.  Sponge,  needle, and instrument counts were correct.   Benzoin, Steri-Strips, and  sterile dressings were applied.  The patient tolerated the procedure  well without apparent complication, and prior to leaving operating room,  his right testicle was returned in anatomic position in his scrotum.  He  was taken to the recovery in stable condition.      Merri Ray Grandville Silos, M.D.  Electronically Signed     BET/MEDQ  D:  11/08/2006  T:  11/08/2006  Job:  [id number]   cc:   Rosalita Chessman, DO  Drucie Opitz, MD

## 2010-11-02 ENCOUNTER — Other Ambulatory Visit: Payer: Self-pay | Admitting: Family Medicine

## 2010-12-22 ENCOUNTER — Other Ambulatory Visit: Payer: Self-pay | Admitting: Family Medicine

## 2011-02-17 ENCOUNTER — Encounter: Payer: Self-pay | Admitting: Family Medicine

## 2011-02-17 ENCOUNTER — Ambulatory Visit (INDEPENDENT_AMBULATORY_CARE_PROVIDER_SITE_OTHER): Payer: Commercial Managed Care - PPO | Admitting: Family Medicine

## 2011-02-17 VITALS — BP 120/84 | HR 60 | Temp 98.6°F | Wt 209.6 lb

## 2011-02-17 DIAGNOSIS — L089 Local infection of the skin and subcutaneous tissue, unspecified: Secondary | ICD-10-CM

## 2011-02-17 DIAGNOSIS — L723 Sebaceous cyst: Secondary | ICD-10-CM

## 2011-02-17 MED ORDER — CEPHALEXIN 500 MG PO CAPS: 500.0000 mg | ORAL_CAPSULE | Freq: Four times a day (QID) | ORAL | Status: DC

## 2011-02-17 NOTE — Patient Instructions (Signed)
Sebaceous Cysts Sebaceous cysts are sometimes called epidermal cysts. They may come from injury to the skin or an infected hair follicle. The cyst usually contains a "pasty" or "cheesy" looking substance which has a bad smell. This is from the substance called keratin which fills sebaceous cysts. Keratin is a protein that creates the sac of cells called sebaceous cysts.  SYMPTOMS Sebaceous cysts are usually found on the face, neck, or trunk. They also may occur in the vaginal area or other parts of the genitalia of both women and men. Sebaceous cysts are usually painless, slow-growing small bumps or lumps that move freely under the skin. It's important not to try and pop them. This may cause an infection and lead to tenderness and swelling. Occasionally infections may occur. Signs or symptoms that may indicate infection of sebaceous cysts include:   Redness.   Tenderness.   Increased temperature of the skin over the bumps or lumps.   Grayish white, cheesy, foul smelling material draining from the bump or lump.  DIAGNOSIS Sebaceous cysts are easily diagnosed on exam by your caregiver. Rarely, a biopsy may be needed to rule out other conditions that may resemble sebaceous cysts.  TREATMENT  Sebaceous cysts often get better and disappear on their own. They are rarely ever cancerous.   Sometimes they may become inflamed and tender if they become infected. This may require opening and draining the cyst. Treatment with antibiotics (medications which kill germs) may be necessary. When the infection is gone, the cyst may be removed with minor surgery.   Small inflamed cysts can often be treated by injecting steroid medications or with antibiotics.   Sometimes sebaceous cysts become large and bothersome. If this happens, surgical removal in your caregiver's office may be necessary.  COMPLICATIONS  Sebaceous cysts may occasionally become infected and form into painful abscesses.   Even when the cyst  is removed, sebaceous cyst recurrence in not unusual.   Consult your health care provider anytime you notice any type of growth, bump, or lump on your body.  SEEK MEDICAL CARE IF:  You develop any signs of infection.   Your condition is not improving, or is getting worse.   You have any other questions or concerns.  Document Released: 05/01/2004 Document Re-Released: 08/27/2008 [facility] Patient Information 2011 [place].

## 2011-02-17 NOTE — Progress Notes (Signed)
  Subjective:    Patient ID: William [name], male    DOB: 13-Jun-1941, 71 y.o.   MRN: [id number]  HPI Pt here c/o cyst on back x > 40 years that recently got bigger and infected.  Pt wife has been squeezing it.   Review of Systems As above    Objective:   Physical Exam  Constitutional: He is oriented to person, place, and time. He appears well-developed and well-nourished.  Neurological: He is alert and oriented to person, place, and time.  Skin:             Assessment & Plan:  I&D sebaceous cyst---  Keflex for 10 days,  Warm compresses         To surgeon if it does not improve

## 2011-03-02 ENCOUNTER — Encounter (INDEPENDENT_AMBULATORY_CARE_PROVIDER_SITE_OTHER): Payer: Commercial Managed Care - PPO | Admitting: Family Medicine

## 2011-03-02 ENCOUNTER — Telehealth: Payer: Self-pay | Admitting: Family Medicine

## 2011-03-02 DIAGNOSIS — L0291 Cutaneous abscess, unspecified: Secondary | ICD-10-CM

## 2011-03-02 DIAGNOSIS — L089 Local infection of the skin and subcutaneous tissue, unspecified: Secondary | ICD-10-CM

## 2011-03-02 MED ORDER — CEPHALEXIN 500 MG PO CAPS: 500.0000 mg | ORAL_CAPSULE | Freq: Four times a day (QID) | ORAL | Status: DC

## 2011-03-02 NOTE — Telephone Encounter (Signed)
Please advise      KP 

## 2011-03-02 NOTE — Telephone Encounter (Signed)
Per Dr.Lowne refax the Keflex to the pharmacy    KP

## 2011-03-02 NOTE — Telephone Encounter (Signed)
Patient has appt with surgeon [phone] - patient said still looks infected - he took last antibiotic today - dr Etter Sjogren said we could call refill in - [facility]

## 2011-03-03 ENCOUNTER — Encounter (INDEPENDENT_AMBULATORY_CARE_PROVIDER_SITE_OTHER): Payer: Self-pay | Admitting: Surgery

## 2011-03-03 ENCOUNTER — Ambulatory Visit (INDEPENDENT_AMBULATORY_CARE_PROVIDER_SITE_OTHER): Payer: Commercial Managed Care - PPO | Admitting: Surgery

## 2011-03-03 VITALS — BP 128/74 | HR 68 | Temp 98.1°F | Resp 16 | Ht 70.0 in | Wt 209.8 lb

## 2011-03-03 DIAGNOSIS — L723 Sebaceous cyst: Secondary | ICD-10-CM

## 2011-03-03 NOTE — Progress Notes (Signed)
This encounter was created in error - please disregard.

## 2011-03-03 NOTE — Progress Notes (Signed)
Chief complaint Back abscess History of present illness: The patient had a cyst on his back for a long time. Apparently it became red warm and had purulent material draining. He was placed on some antibiotics by his primary physician in the area was opened and drained. They came back in today for surgical evaluation to see if any further drainage to be done.  The patient has noted. Has remained low but red but there has been no additional drainage. He has had no systemic signs.  Physical exam: Gen.: The patient alert oriented a generally healthy-appearing Skin: On the back there is a 2.5 cm area is red and appears to be a resolving epidermoid cyst has been drained. There is some dry skin in the very middle of that. There is no frank infection today.  Impression: Recently infected epidermoid cyst of back now resolving  Plan: I told nothing surgical needed to be done at the current time. The primary care physician is an excellent job getting this drained. He has a residual cyst to come back for [place] to recheck.

## 2011-03-03 NOTE — Patient Instructions (Signed)
Finish the new dose of antibiotics. If you think there is any residual cyst or lump under the skin after this had a few more weeks and he'll let me know and we can decide if something needs to be surgically removed.  Currently I think this has almost healed. I don't believe any thing further needs to be done at the present time.

## 2011-03-23 ENCOUNTER — Ambulatory Visit (INDEPENDENT_AMBULATORY_CARE_PROVIDER_SITE_OTHER): Payer: Commercial Managed Care - PPO

## 2011-03-23 DIAGNOSIS — Z23 Encounter for immunization: Secondary | ICD-10-CM

## 2011-05-03 ENCOUNTER — Other Ambulatory Visit: Payer: Self-pay | Admitting: Family Medicine

## 2011-08-02 ENCOUNTER — Other Ambulatory Visit: Payer: Self-pay | Admitting: Family Medicine

## 2011-08-02 NOTE — Telephone Encounter (Signed)
Rx faxed and letter mailed to schedule an apt.     KP

## 2011-08-18 ENCOUNTER — Other Ambulatory Visit: Payer: Self-pay | Admitting: Family Medicine

## 2011-08-18 NOTE — Telephone Encounter (Signed)
Last seen 02/13/14 and Rx has not been filled in [place]. Please advise     KP

## 2011-08-18 NOTE — Telephone Encounter (Signed)
Pt seen 02/14/11---ok to refill x1,   5 refills

## 2011-09-27 ENCOUNTER — Other Ambulatory Visit: Payer: Self-pay | Admitting: Family Medicine

## 2011-10-19 ENCOUNTER — Ambulatory Visit (INDEPENDENT_AMBULATORY_CARE_PROVIDER_SITE_OTHER): Payer: 59 | Admitting: Family Medicine

## 2011-10-19 ENCOUNTER — Encounter: Payer: Self-pay | Admitting: Family Medicine

## 2011-10-19 VITALS — BP 120/76 | HR 67 | Temp 98.1°F | Ht 69.0 in | Wt 217.8 lb

## 2011-10-19 DIAGNOSIS — Z23 Encounter for immunization: Secondary | ICD-10-CM

## 2011-10-19 DIAGNOSIS — Z8679 Personal history of other diseases of the circulatory system: Secondary | ICD-10-CM

## 2011-10-19 DIAGNOSIS — R739 Hyperglycemia, unspecified: Secondary | ICD-10-CM

## 2011-10-19 DIAGNOSIS — E785 Hyperlipidemia, unspecified: Secondary | ICD-10-CM

## 2011-10-19 DIAGNOSIS — I6529 Occlusion and stenosis of unspecified carotid artery: Secondary | ICD-10-CM

## 2011-10-19 DIAGNOSIS — Z87898 Personal history of other specified conditions: Secondary | ICD-10-CM

## 2011-10-19 DIAGNOSIS — R7309 Other abnormal glucose: Secondary | ICD-10-CM

## 2011-10-19 DIAGNOSIS — Z0000 Encounter for general adult medical examination without abnormal findings: Secondary | ICD-10-CM

## 2011-10-19 LAB — CBC WITH DIFFERENTIAL/PLATELET
Basophils Relative: 0.8 % (ref 0.0–3.0)
Eosinophils Relative: 2.6 % (ref 0.0–5.0)
Hemoglobin: 12.7 g/dL — ABNORMAL LOW (ref 13.0–17.0)
Lymphocytes Relative: 25.4 % (ref 12.0–46.0)
Monocytes Relative: 6.8 % (ref 3.0–12.0)
Neutro Abs: 5 10*3/uL (ref 1.4–7.7)
Neutrophils Relative %: 64.4 % (ref 43.0–77.0)
RBC: 4.19 Mil/uL — ABNORMAL LOW (ref 4.22–5.81)
WBC: 7.8 10*3/uL (ref 4.5–10.5)

## 2011-10-19 LAB — HEPATIC FUNCTION PANEL
ALT: 21 U/L (ref 0–53)
AST: 27 U/L (ref 0–37)
Albumin: 3.9 g/dL (ref 3.5–5.2)
Alkaline Phosphatase: 45 U/L (ref 39–117)
Bilirubin, Direct: 0.1 mg/dL (ref 0.0–0.3)
Total Protein: 7.4 g/dL (ref 6.0–8.3)

## 2011-10-19 LAB — BASIC METABOLIC PANEL
CO2: 26 mEq/L (ref 19–32)
Calcium: 9.8 mg/dL (ref 8.4–10.5)
Chloride: 108 mEq/L (ref 96–112)
Creatinine, Ser: 1.7 mg/dL — ABNORMAL HIGH (ref 0.4–1.5)
Sodium: 143 mEq/L (ref 135–145)

## 2011-10-19 LAB — LIPID PANEL
HDL: 53.3 mg/dL (ref >39.00)
Total CHOL/HDL Ratio: 2

## 2011-10-19 LAB — PSA: PSA: 2.67 ng/mL (ref 0.10–4.00)

## 2011-10-19 LAB — HEMOGLOBIN A1C: Hgb A1c MFr Bld: 5.6 % (ref 4.6–6.5)

## 2011-10-19 LAB — TSH: TSH: 1.53 u[IU]/mL (ref 0.35–5.50)

## 2011-10-19 NOTE — Patient Instructions (Signed)
Preventive Care for Adults, Male A healthy lifestyle and preventative care can promote health and wellness. Preventative health guidelines for men include the following key practices:  A routine yearly physical is a good way to check with your caregiver about your health and preventative screening. It is a chance to share any concerns and updates on your health, and to receive a thorough exam.   Visit your dentist for a routine exam and preventative care every 6 months. Brush your teeth twice a day and floss once a day. Good oral hygiene prevents tooth decay and gum disease.   The frequency of eye exams is based on your age, health, family medical history, use of contact lenses, and other factors. Follow your caregiver's recommendations for frequency of eye exams.   Eat a healthy diet. Foods like vegetables, fruits, whole grains, low-fat dairy products, and lean protein foods contain the nutrients you need without too many calories. Decrease your intake of foods high in solid fats, added sugars, and salt. Eat the right amount of calories for you.Get information about a proper diet from your caregiver, if necessary.   Regular physical exercise is one of the most important things you can do for your health. Most adults should get at least 150 minutes of moderate-intensity exercise (any activity that increases your heart rate and causes you to sweat) each week. In addition, most adults need muscle-strengthening exercises on 2 or more days a week.   Maintain a healthy weight. The body mass index (BMI) is a screening tool to identify possible weight problems. It provides an estimate of body fat based on height and weight. Your caregiver can help determine your BMI, and can help you achieve or maintain a healthy weight.For adults 20 years and older:   A BMI below 18.5 is considered underweight.   A BMI of 18.5 to 24.9 is normal.   A BMI of 25 to 29.9 is considered overweight.   A BMI of 30 and above  is considered obese.   Maintain normal blood lipids and cholesterol levels by exercising and minimizing your intake of saturated fat. Eat a balanced diet with plenty of fruit and vegetables. Blood tests for lipids and cholesterol should begin at age 55 and be repeated every 5 years. If your lipid or cholesterol levels are high, you are over 50, or you are a high risk for heart disease, you may need your cholesterol levels checked more frequently.Ongoing high lipid and cholesterol levels should be treated with medicines if diet and exercise are not effective.   If you smoke, find out from your caregiver how to quit. If you do not use tobacco, do not start.   If you choose to drink alcohol, do not exceed 2 drinks per day. One drink is considered to be 12 ounces (355 mL) of beer, 5 ounces (148 mL) of wine, or 1.5 ounces (44 mL) of liquor.   Avoid use of street drugs. Do not share needles with anyone. Ask for help if you need support or instructions about stopping the use of drugs.   High blood pressure causes heart disease and increases the risk of stroke. Your blood pressure should be checked at least every 1 to 2 years. Ongoing high blood pressure should be treated with medicines, if weight loss and exercise are not effective.   If you are 75 to 69 years old, ask your caregiver if you should take aspirin to prevent heart disease.   Diabetes screening involves taking a blood  sample to check your fasting blood sugar level. This should be done once every 3 years, after age 35, if you are within normal weight and without risk factors for diabetes. Testing should be considered at a younger age or be carried out more frequently if you are overweight and have at least 1 risk factor for diabetes.   Colorectal cancer can be detected and often prevented. Most routine colorectal cancer screening begins at the age of 8 and continues through age 53. However, your caregiver may recommend screening at an earlier  age if you have risk factors for colon cancer. On a yearly basis, your caregiver may provide home test kits to check for hidden blood in the stool. Use of a small camera at the end of a tube, to directly examine the colon (sigmoidoscopy or colonoscopy), can detect the earliest forms of colorectal cancer. Talk to your caregiver about this at age 37, when routine screening begins. Direct examination of the colon should be repeated every 5 to 10 years through age 70, unless early forms of pre-cancerous polyps or small growths are found.   Hepatitis C blood testing is recommended for all people born from [birth year] through 1965 and any individual with known risks for hepatitis C.   Practice safe sex. Use condoms and avoid high-risk sexual practices to reduce the spread of sexually transmitted infections (STIs). STIs include gonorrhea, chlamydia, syphilis, trichomonas, herpes, HPV, and human immunodeficiency virus (HIV). Herpes, HIV, and HPV are viral illnesses that have no cure. They can result in disability, cancer, and death.   A one-time screening for abdominal aortic aneurysm (AAA) and surgical repair of large AAAs by sound wave imaging (ultrasonography) is recommended for ages 87 to 64 years who are current or former smokers.   Healthy men should no longer receive prostate-specific antigen (PSA) blood tests as part of routine cancer screening. Consult with your caregiver about prostate cancer screening.   Testicular cancer screening is not recommended for adult males who have no symptoms. Screening includes self-exam, caregiver exam, and other screening tests. Consult with your caregiver about any symptoms you have or any concerns you have about testicular cancer.   Use sunscreen with skin protection factor (SPF) of 30 or more. Apply sunscreen liberally and repeatedly throughout the day. You should seek shade when your shadow is shorter than you. Protect yourself by wearing long sleeves, pants, a  wide-brimmed hat, and sunglasses year round, whenever you are outdoors.   Once a month, do a whole body skin exam, using a mirror to look at the skin on your back. Notify your caregiver of new moles, moles that have irregular borders, moles that are larger than a pencil eraser, or moles that have changed in shape or color.   Stay current with required immunizations.   Influenza. You need a dose every fall (or winter). The composition of the flu vaccine changes each year, so being vaccinated once is not enough.   Pneumococcal polysaccharide. You need 1 to 2 doses if you smoke cigarettes or if you have certain chronic medical conditions. You need 1 dose at age 31 (or older) if you have never been vaccinated.   Tetanus, diphtheria, pertussis (Tdap, Td). Get 1 dose of Tdap vaccine if you are younger than age 40 years, are over 37 and have contact with an infant, are a Dietitian, or simply want to be protected from whooping cough. After that, you need a Td booster dose every 10 years. Consult your caregiver if  you have not had at least 3 tetanus and diphtheria-containing shots sometime in your life or have a deep or dirty wound.   HPV. This vaccine is recommended for males 57 through 69 years of age. This vaccine may be given to men 23 through 69 years of age who have not completed the 3 dose series. It is recommended for men through age 20 who have sex with men or whose immune system is weakened because of HIV infection, other illness, or medications. The vaccine is given in 3 doses over 6 months.   Measles, mumps, rubella (MMR). You need at least 1 dose of MMR if you were born in 1957 or later. You may also need a 2nd dose.   Meningococcal. If you are age 48 to 19 years and a Market researcher living in a residence hall, or have one of several medical conditions, you need to get vaccinated against meningococcal disease. You may also need additional booster doses.   Zoster (shingles).  If you are age 87 years or older, you should get this vaccine.   Varicella (chickenpox). If you have never had chickenpox or you were vaccinated but received only 1 dose, talk to your caregiver to find out if you need this vaccine.   Hepatitis A. You need this vaccine if you have a specific risk factor for hepatitis A virus infection, or you simply wish to be protected from this disease. The vaccine is usually given as 2 doses, 6 to 18 months apart.   Hepatitis B. You need this vaccine if you have a specific risk factor for hepatitis B virus infection or you simply wish to be protected from this disease. The vaccine is given in 3 doses, usually over 6 months.  Preventative Service / Frequency Ages 65 to 62  Blood pressure check.** / Every 1 to 2 years.   Lipid and cholesterol check.** / Every 5 years beginning at age 56.   Hepatitis C blood test.** / For any individual with known risks for hepatitis C.   Skin self-exam. / Monthly.   Influenza immunization.** / Every year.   Pneumococcal polysaccharide immunization.** / 1 to 2 doses if you smoke cigarettes or if you have certain chronic medical conditions.   Tetanus, diphtheria, pertussis (Tdap,Td) immunization. / A one-time dose of Tdap vaccine. After that, you need a Td booster dose every 10 years.   HPV immunization. / 3 doses over 6 months, if 42 and younger.   Measles, mumps, rubella (MMR) immunization. / You need at least 1 dose of MMR if you were born in 1957 or later. You may also need a 2nd dose.   Meningococcal immunization. / 1 dose if you are age 74 to 57 years and a Market researcher living in a residence hall, or have one of several medical conditions, you need to get vaccinated against meningococcal disease. You may also need additional booster doses.   Varicella immunization.** / Consult your caregiver.   Hepatitis A immunization.** / Consult your caregiver. 2 doses, 6 to 18 months apart.   Hepatitis B  immunization.** / Consult your caregiver. 3 doses usually over 6 months.  Ages 52 to 56  Blood pressure check.** / Every 1 to 2 years.   Lipid and cholesterol check.** / Every 5 years beginning at age 43.   Fecal occult blood test (FOBT) of stool. / Every year beginning at age 64 and continuing until age 41. You may not have to do this test if  you get colonoscopy every 10 years.   Flexible sigmoidoscopy** or colonoscopy.** / Every 5 years for a flexible sigmoidoscopy or every 10 years for a colonoscopy beginning at age 19 and continuing until age 34.   Hepatitis C blood test.** / For all people born from [birth year] through 1965 and any individual with known risks for hepatitis C.   Skin self-exam. / Monthly.   Influenza immunization.** / Every year.   Pneumococcal polysaccharide immunization.** / 1 to 2 doses if you smoke cigarettes or if you have certain chronic medical conditions.   Tetanus, diphtheria, pertussis (Tdap/Td) immunization.** / A one-time dose of Tdap vaccine. After that, you need a Td booster dose every 10 years.   Measles, mumps, rubella (MMR) immunization. / You need at least 1 dose of MMR if you were born in 1957 or later. You may also need a 2nd dose.   Varicella immunization.**/ Consult your caregiver.   Meningococcal immunization.** / Consult your caregiver.   Hepatitis A immunization.** / Consult your caregiver. 2 doses, 6 to 18 months apart.   Hepatitis B immunization.** / Consult your caregiver. 3 doses, usually over 6 months.  Ages 43 and over  Blood pressure check.** / Every 1 to 2 years.   Lipid and cholesterol check.**/ Every 5 years beginning at age 34.   Fecal occult blood test (FOBT) of stool. / Every year beginning at age 43 and continuing until age 56. You may not have to do this test if you get colonoscopy every 10 years.   Flexible sigmoidoscopy** or colonoscopy.** / Every 5 years for a flexible sigmoidoscopy or every 10 years for a colonoscopy  beginning at age 11 and continuing until age 62.   Hepatitis C blood test.** / For all people born from [birth year] through 1965 and any individual with known risks for hepatitis C.   Abdominal aortic aneurysm (AAA) screening.** / A one-time screening for ages 76 to 74 years who are current or former smokers.   Skin self-exam. / Monthly.   Influenza immunization.** / Every year.   Pneumococcal polysaccharide immunization.** / 1 dose at age 7 (or older) if you have never been vaccinated.   Tetanus, diphtheria, pertussis (Tdap, Td) immunization. / A one-time dose of Tdap vaccine if you are over 65 and have contact with an infant, are a Dietitian, or simply want to be protected from whooping cough. After that, you need a Td booster dose every 10 years.   Varicella immunization. ** / Consult your caregiver.   Meningococcal immunization.** / Consult your caregiver.   Hepatitis A immunization. ** / Consult your caregiver. 2 doses, 6 to 18 months apart.   Hepatitis B immunization.** / Check with your caregiver. 3 doses, usually over 6 months.  **Family history and personal history of risk and conditions may change your caregiver's recommendations. Document Released: 07/27/2001 Document Revised: 05/20/2011 Document Reviewed: 10/26/2010 [facility] Patient Information 2012 [place], [place].

## 2011-10-19 NOTE — Assessment & Plan Note (Signed)
Stable,  No complications

## 2011-10-19 NOTE — Progress Notes (Signed)
Subjective:    Patient ID: William [name], male    DOB: 08/27/1940, 71 y.o.   MRN: [id number]  HPI Pt here for cpe and labs.  No complaints.  Pt saw Urologist few weeks ago. No problems.  Review of Systems Review of Systems  Constitutional: Negative for activity change, appetite change and fatigue.  HENT: Negative for hearing loss, congestion, tinnitus and ear discharge.  dentist q28m Eyes: Negative for visual disturbance (see optho q1y -- vision corrected to 20/20 with glasses).  Respiratory: Negative for cough, chest tightness and shortness of breath.   Cardiovascular: Negative for chest pain, palpitations and leg swelling.  Gastrointestinal: Negative for abdominal pain, diarrhea, constipation and abdominal distention.  Genitourinary: Negative for urgency, frequency, decreased urine volume and difficulty urinating.  Musculoskeletal: Negative for back pain, arthralgias and gait problem.  Skin: Negative for color change, pallor and rash.  Neurological: Negative for dizziness, light-headedness, numbness and headaches.  Hematological: Negative for adenopathy. Does not bruise/bleed easily.  Psychiatric/Behavioral: Negative for suicidal ideas, confusion, sleep disturbance, self-injury, dysphoric mood, decreased concentration and agitation.     Past Medical History  Diagnosis Date  . Sebaceous cyst   . Arthritis   . Hyperlipidemia   . Stroke 2006   History   Social History  . Marital Status: Married    Spouse Name: N/A    Number of Children: N/A  . Years of Education: N/A   Social History Main Topics  . Smoking status: Former Smoker -- 1.0 packs/day for 40 years    Types: Cigarettes  . Smokeless tobacco: Former Systems developer    Quit date: 10/18/2004   Comment: quit 2006  . Alcohol Use: 1.2 oz/week    2 Glasses of wine per week  . Drug Use: No  . Sexually Active: Yes -- Male partner(s)   Other Topics Concern  . None   Social History Narrative   Exercise--  Engineer, manufacturing everyday    Family History  Problem Relation Age of Onset  . Heart disease Mother   . Heart disease Father        Objective:   Physical Exam BP 120/76  Pulse 67  Temp(Src) 98.1 F (36.7 C) (Oral)  Ht 5\' 9"  (1.753 m)  Wt 217 lb 12.8 oz (98.793 kg)  BMI 32.16 kg/m2  SpO2 97%  General Appearance:    Alert, cooperative, no distress, appears stated age  Head:    Normocephalic, without obvious abnormality, atraumatic  Eyes:    PERRL, conjunctiva/corneas clear, EOM's intact, fundi    benign, both eyes       Ears:    Normal TM's and external ear canals, both ears  Nose:   Nares normal, septum midline, mucosa normal, no drainage   or sinus tenderness  Throat:   Lips, mucosa, and tongue normal; teeth and gums normal  Neck:   Supple, symmetrical, trachea midline, no adenopathy;       thyroid:  No enlargement/tenderness/nodules; no carotid   bruit or JVD  Back:     Symmetric, no curvature, ROM normal, no CVA tenderness  Lungs:     Clear to auscultation bilaterally, respirations unlabored  Chest wall:    No tenderness or deformity  Heart:    Regular rate and rhythm, S1 and S2 normal, no murmur, rub   or gallop  Abdomen:     Soft, non-tender, bowel sounds active all four quadrants,    no masses, no organomegaly  Genitalia:  urology  Rectal:    Per  urology  Extremities:   Extremities normal, atraumatic, no cyanosis or edema  Pulses:   2+ and symmetric all extremities  Skin:   Skin color, texture, turgor normal, no rashes or lesions  Lymph nodes:   Cervical, supraclavicular, and axillary nodes normal  Neurologic:   CNII-XII intact. Normal strength, sensation and reflexes      throughout         Assessment & Plan:  cpe-- ghm utd         Check fasting labs

## 2011-10-19 NOTE — Assessment & Plan Note (Signed)
Check labs 

## 2011-10-19 NOTE — Assessment & Plan Note (Signed)
Check labs con't meds 

## 2011-10-19 NOTE — Assessment & Plan Note (Signed)
Per u rology 

## 2011-10-19 NOTE — Assessment & Plan Note (Signed)
Per cvts Check labs

## 2011-10-25 ENCOUNTER — Other Ambulatory Visit: Payer: Self-pay | Admitting: Family Medicine

## 2011-11-01 ENCOUNTER — Other Ambulatory Visit: Payer: Self-pay | Admitting: Family Medicine

## 2011-11-23 ENCOUNTER — Other Ambulatory Visit: Payer: Self-pay | Admitting: Family Medicine

## 2011-11-26 ENCOUNTER — Telehealth: Payer: Self-pay | Admitting: *Deleted

## 2011-11-26 DIAGNOSIS — N289 Disorder of kidney and ureter, unspecified: Secondary | ICD-10-CM

## 2011-11-26 NOTE — Telephone Encounter (Signed)
msg left to call the office     KP 

## 2011-11-26 NOTE — Telephone Encounter (Signed)
Please advise on lab results from 10-19-11.

## 2011-11-26 NOTE — Telephone Encounter (Signed)
Bun / cr elevated---- increase po fluids--- at least 40 oz daily----recheck in 2 weeks     Bun/ cr   Dx elevated kidney function

## 2011-11-27 ENCOUNTER — Encounter: Payer: Self-pay | Admitting: Family Medicine

## 2011-11-30 NOTE — Telephone Encounter (Signed)
Discussed with patient and he voiced understandng.     KP

## 2011-12-14 ENCOUNTER — Other Ambulatory Visit (INDEPENDENT_AMBULATORY_CARE_PROVIDER_SITE_OTHER): Payer: 59

## 2011-12-14 DIAGNOSIS — N289 Disorder of kidney and ureter, unspecified: Secondary | ICD-10-CM

## 2011-12-14 LAB — CREATININE, SERUM: Creatinine, Ser: 1.8 mg/dL — ABNORMAL HIGH (ref 0.4–1.5)

## 2011-12-14 LAB — BUN: BUN: 30 mg/dL — ABNORMAL HIGH (ref 6–23)

## 2011-12-20 ENCOUNTER — Other Ambulatory Visit: Payer: Self-pay | Admitting: Family Medicine

## 2011-12-20 MED ORDER — ESCITALOPRAM OXALATE 20 MG PO TABS: 20.0000 mg | ORAL_TABLET | Freq: Every day | ORAL | Status: DC

## 2011-12-20 NOTE — Telephone Encounter (Signed)
refill escitalopram 20mg  tablet #90, take 1 tablet by mouth once daily, last fill 4.16.13, last ov 5.7.13

## 2012-01-11 ENCOUNTER — Ambulatory Visit (INDEPENDENT_AMBULATORY_CARE_PROVIDER_SITE_OTHER): Payer: 59 | Admitting: Family Medicine

## 2012-01-11 ENCOUNTER — Encounter: Payer: Self-pay | Admitting: Family Medicine

## 2012-01-11 VITALS — BP 118/74 | HR 60 | Temp 98.1°F | Wt 216.4 lb

## 2012-01-11 DIAGNOSIS — N289 Disorder of kidney and ureter, unspecified: Secondary | ICD-10-CM

## 2012-01-11 NOTE — Progress Notes (Signed)
  Subjective:    Patient ID: William [name], male    DOB: 24-Dec-1940, 71 y.o.   MRN: [id number]  HPI Pt here with his wife to discuss labs and have them redrawn.  No other concerns.   Review of Systems    as above Objective:   Physical Exam  Constitutional: He appears well-developed and well-nourished.  Neurological: He is alert.  Psychiatric: He has a normal mood and affect. His behavior is normal. Judgment and thought content normal.          Assessment & Plan:  Elevated bun /cr --  Pt has been drinking more fluids---wil will recheck today                                 Consider decreasing mobic / and / or referral to nephrology

## 2012-01-11 NOTE — Patient Instructions (Addendum)
Dehydration, Adult Dehydration is when you lose more fluids from the body than you take in. Vital organs like the kidneys, brain, and heart cannot function without a proper amount of fluids and salt. Any loss of fluids from the body can cause dehydration.  CAUSES   Vomiting.   Diarrhea.   Excessive sweating.   Excessive urine output.   Fever.  SYMPTOMS  Mild dehydration  Thirst.   Dry lips.   Slightly dry mouth.  Moderate dehydration  Very dry mouth.   Sunken eyes.   Skin does not bounce back quickly when lightly pinched and released.   Dark urine and decreased urine production.   Decreased tear production.   Headache.  Severe dehydration  Very dry mouth.   Extreme thirst.   Rapid, weak pulse (more than 100 beats per minute at rest).   Cold hands and feet.   Not able to sweat in spite of heat and temperature.   Rapid breathing.   Blue lips.   Confusion and lethargy.   Difficulty being awakened.   Minimal urine production.   No tears.  DIAGNOSIS  Your caregiver will diagnose dehydration based on your symptoms and your exam. Blood and urine tests will help confirm the diagnosis. The diagnostic evaluation should also identify the cause of dehydration. TREATMENT  Treatment of mild or moderate dehydration can often be done at home by increasing the amount of fluids that you drink. It is best to drink small amounts of fluid more often. Drinking too much at one time can make vomiting worse. Refer to the home care instructions below. Severe dehydration needs to be treated at the hospital where you will probably be given intravenous (IV) fluids that contain water and electrolytes. HOME CARE INSTRUCTIONS   Ask your caregiver about specific rehydration instructions.   Drink enough fluids to keep your urine clear or pale yellow.   Drink small amounts frequently if you have nausea and vomiting.   Eat as you normally do.   Avoid:   Foods or drinks high in  sugar.   Carbonated drinks.   Juice.   Extremely hot or cold fluids.   Drinks with caffeine.   Fatty, greasy foods.   Alcohol.   Tobacco.   Overeating.   Gelatin desserts.   Wash your hands well to avoid spreading bacteria and viruses.   Only take over-the-counter or prescription medicines for pain, discomfort, or fever as directed by your caregiver.   Ask your caregiver if you should continue all prescribed and over-the-counter medicines.   Keep all follow-up appointments with your caregiver.  SEEK MEDICAL CARE IF:  You have abdominal pain and it increases or stays in one area (localizes).   You have a rash, stiff neck, or severe headache.   You are irritable, sleepy, or difficult to awaken.   You are weak, dizzy, or extremely thirsty.  SEEK IMMEDIATE MEDICAL CARE IF:   You are unable to keep fluids down or you get worse despite treatment.   You have frequent episodes of vomiting or diarrhea.   You have blood or green matter (bile) in your vomit.   You have blood in your stool or your stool looks black and tarry.   You have not urinated in 6 to 8 hours, or you have only urinated a small amount of very dark urine.   You have a fever.   You faint.  MAKE SURE YOU:   Understand these instructions.   Will watch your condition.  Will get help right away if you are not doing well or get worse.  Document Released: 05/31/2005 Document Revised: 05/20/2011 Document Reviewed: 01/18/2011 [facility] Patient Information 2012 [employer].

## 2012-01-12 LAB — BASIC METABOLIC PANEL
BUN: 26 mg/dL — ABNORMAL HIGH (ref 6–23)
Creatinine, Ser: 1.8 mg/dL — ABNORMAL HIGH (ref 0.4–1.5)
GFR: 38.91 mL/min — ABNORMAL LOW (ref >60.00)
Potassium: 4.1 mEq/L (ref 3.5–5.1)

## 2012-01-19 ENCOUNTER — Other Ambulatory Visit (INDEPENDENT_AMBULATORY_CARE_PROVIDER_SITE_OTHER): Payer: 59

## 2012-01-19 DIAGNOSIS — N289 Disorder of kidney and ureter, unspecified: Secondary | ICD-10-CM

## 2012-01-19 LAB — BASIC METABOLIC PANEL
CO2: 24 mEq/L (ref 19–32)
Chloride: 108 mEq/L (ref 96–112)
Creatinine, Ser: 1.4 mg/dL (ref 0.4–1.5)
Potassium: 3.7 mEq/L (ref 3.5–5.1)
Sodium: 140 mEq/L (ref 135–145)

## 2012-01-19 NOTE — Progress Notes (Signed)
Labs only

## 2012-01-26 ENCOUNTER — Encounter: Payer: Self-pay | Admitting: Family Medicine

## 2012-01-27 MED ORDER — TRAMADOL HCL 50 MG PO TABS
50.0000 mg | ORAL_TABLET | Freq: Four times a day (QID) | ORAL | Status: DC | PRN
Start: 2012-01-27 — End: 2012-02-15

## 2012-02-15 ENCOUNTER — Other Ambulatory Visit: Payer: Self-pay | Admitting: Family Medicine

## 2012-02-15 NOTE — Telephone Encounter (Signed)
Noted MD Lowne sent pt refill via escribe #30 with one refill

## 2012-02-15 NOTE — Telephone Encounter (Signed)
Last OV 01-11-12 last refill 01-27-12 #30 no refills directions Q6hrs/PRN

## 2012-03-07 ENCOUNTER — Encounter: Payer: Self-pay | Admitting: Family Medicine

## 2012-03-09 ENCOUNTER — Encounter: Payer: Self-pay | Admitting: Family Medicine

## 2012-03-09 ENCOUNTER — Ambulatory Visit (INDEPENDENT_AMBULATORY_CARE_PROVIDER_SITE_OTHER): Payer: 59 | Admitting: Family Medicine

## 2012-03-09 VITALS — BP 94/62 | HR 73 | Temp 98.3°F | Wt 206.6 lb

## 2012-03-09 DIAGNOSIS — Z23 Encounter for immunization: Secondary | ICD-10-CM

## 2012-03-09 DIAGNOSIS — Z9181 History of falling: Secondary | ICD-10-CM

## 2012-03-09 DIAGNOSIS — W19XXXA Unspecified fall, initial encounter: Secondary | ICD-10-CM

## 2012-03-09 DIAGNOSIS — R42 Dizziness and giddiness: Secondary | ICD-10-CM

## 2012-03-09 DIAGNOSIS — I959 Hypotension, unspecified: Secondary | ICD-10-CM

## 2012-03-09 LAB — POCT URINALYSIS DIPSTICK
Leukocytes, UA: NEGATIVE
Nitrite, UA: NEGATIVE
Protein, UA: NEGATIVE
Urobilinogen, UA: 0.2
pH, UA: 5

## 2012-03-09 NOTE — Patient Instructions (Signed)
Dizziness Dizziness is a common problem. It is a feeling of unsteadiness or lightheadedness. You may feel like you are about to faint. Dizziness can lead to injury if you stumble or fall. A person of any age group can suffer from dizziness, but dizziness is more common in older adults. CAUSES  Dizziness can be caused by many different things, including:  Middle ear problems.   Standing for too long.   Infections.   An allergic reaction.   Aging.   An emotional response to something, such as the sight of blood.   Side effects of medicines.   Fatigue.   Problems with circulation or blood pressure.   Excess use of alcohol, medicines, or illegal drug use.   Breathing too fast (hyperventilation).   An arrhythmia or problems with your heart rhythm.   Low red blood cell count (anemia).   Pregnancy.   Vomiting, diarrhea, fever, or other illnesses that cause dehydration.   Diseases or conditions such as Parkinson's disease, high blood pressure (hypertension), diabetes, and thyroid problems.   Exposure to extreme heat.  DIAGNOSIS  To find the cause of your dizziness, your caregiver may do a physical exam, lab tests, radiologic imaging scans, or an electrocardiography test (ECG).  TREATMENT  Treatment of dizziness depends on the cause of your symptoms and can vary greatly. HOME CARE INSTRUCTIONS   Drink enough fluids to keep your urine clear or pale yellow. This is especially important in very hot weather. In the elderly, it is also important in cold weather.   If your dizziness is caused by medicines, take them exactly as directed. When taking blood pressure medicines, it is especially important to get up slowly.   Rise slowly from chairs and steady yourself until you feel okay.   In the morning, first sit up on the side of the bed. When this seems okay, stand slowly while holding onto something until you know your balance is fine.   If you need to stand in one place for a  long time, be sure to move your legs often. Tighten and relax the muscles in your legs while standing.   If dizziness continues to be a problem, have someone stay with you for a day or two. Do this until you feel you are well enough to stay alone. Have the person call your caregiver if he or she notices changes in you that are concerning.   Do not drive or use heavy machinery if you feel dizzy.  SEEK IMMEDIATE MEDICAL CARE IF:   Your dizziness or lightheadedness gets worse.   You feel nauseous or vomit.   You develop problems with talking, walking, weakness, or using your arms, hands, or legs.   You are not thinking clearly or you have difficulty forming sentences. It may take a friend or family member to determine if your thinking is normal.   You develop chest pain, abdominal pain, shortness of breath, or sweating.   Your vision changes.   You notice any bleeding.   You have side effects from medicine that seems to be getting worse rather than better.  MAKE SURE YOU:   Understand these instructions.   Will watch your condition.   Will get help right away if you are not doing well or get worse.  Document Released: 11/24/2000 Document Revised: 05/20/2011 Document Reviewed: 12/18/2010 [employer] Patient Information 2012 [employer]. 

## 2012-03-10 LAB — CBC WITH DIFFERENTIAL/PLATELET
Basophils Absolute: 0.1 10*3/uL (ref 0.0–0.1)
Eosinophils Relative: 2 % (ref 0.0–5.0)
HCT: 39.3 % (ref 39.0–52.0)
Hemoglobin: 12.7 g/dL — ABNORMAL LOW (ref 13.0–17.0)
Lymphs Abs: 1.9 10*3/uL (ref 0.7–4.0)
MCV: 89.5 fl (ref 78.0–100.0)
Monocytes Absolute: 0.6 10*3/uL (ref 0.1–1.0)
Monocytes Relative: 7.4 % (ref 3.0–12.0)
Neutro Abs: 5.7 10*3/uL (ref 1.4–7.7)
RDW: 14.4 % (ref 11.5–14.6)

## 2012-03-10 LAB — BASIC METABOLIC PANEL
Chloride: 104 mEq/L (ref 96–112)
GFR: 41.21 mL/min — ABNORMAL LOW (ref >60.00)
Glucose, Bld: 126 mg/dL — ABNORMAL HIGH (ref 70–99)
Potassium: 3.9 mEq/L (ref 3.5–5.1)
Sodium: 138 mEq/L (ref 135–145)

## 2012-03-10 LAB — HEPATIC FUNCTION PANEL
ALT: 24 U/L (ref 0–53)
AST: 30 U/L (ref 0–37)
Albumin: 3.9 g/dL (ref 3.5–5.2)

## 2012-03-11 ENCOUNTER — Encounter: Payer: Self-pay | Admitting: Family Medicine

## 2012-03-11 ENCOUNTER — Ambulatory Visit (HOSPITAL_BASED_OUTPATIENT_CLINIC_OR_DEPARTMENT_OTHER)
Admission: RE | Admit: 2012-03-11 | Discharge: 2012-03-11 | Disposition: A | Discharge status: Home / Self Care | Payer: 59 | Source: Ambulatory Visit | Attending: Family Medicine | Admitting: Family Medicine

## 2012-03-11 DIAGNOSIS — R42 Dizziness and giddiness: Secondary | ICD-10-CM | POA: Insufficient documentation

## 2012-03-11 DIAGNOSIS — I10 Essential (primary) hypertension: Secondary | ICD-10-CM | POA: Insufficient documentation

## 2012-03-11 DIAGNOSIS — R51 Headache: Secondary | ICD-10-CM | POA: Insufficient documentation

## 2012-03-11 DIAGNOSIS — I959 Hypotension, unspecified: Secondary | ICD-10-CM

## 2012-03-11 DIAGNOSIS — W19XXXA Unspecified fall, initial encounter: Secondary | ICD-10-CM

## 2012-03-11 DIAGNOSIS — I6350 Cerebral infarction due to unspecified occlusion or stenosis of unspecified cerebral artery: Secondary | ICD-10-CM | POA: Insufficient documentation

## 2012-03-11 NOTE — Progress Notes (Signed)
  Subjective:     William [name] is a 71 y.o. male who presents for evaluation of dizziness. The symptoms started 1 month ago and are stable. The attacks occur whenever he changes position.   Positions that worsen symptoms: any change.   Previous workup/treatments: none.   Associated ear symptoms: none. Associated CNS symptoms: inc falling, . Recent infections: none. Head trauma: no. Drug ingestion: no. Noise exposure: none. Family history: na  Past Medical History  Diagnosis Date  . Sebaceous cyst   . Arthritis   . Hyperlipidemia   . Stroke 2006   Current Outpatient Prescriptions on File Prior to Visit  Medication Sig Dispense Refill  . ABILIFY 10 MG tablet TAKE 1 TABLET BY MOUTH DAILY  90 tablet  3  . aspirin 325 MG tablet Take 325 mg by mouth daily.        . B Complex Vitamins (VITAMIN B COMPLEX PO) Take by mouth daily.        . Calcium Carb-Cholecalciferol (CALCIUM 1000 + D PO) Take by mouth daily.        . Cholecalciferol (VITAMIN D3) 1000 UNITS CAPS Take by mouth daily.        . CRESTOR 40 MG tablet TAKE 1 TABLET BY MOUTH AT BEDTIME  90 tablet  1  . escitalopram (LEXAPRO) 20 MG tablet Take 1 tablet (20 mg total) by mouth daily.  90 tablet  1  . fenofibrate 160 MG tablet TAKE 1 TABLET BY MOUTH DAILY  90 tablet  3  . Flaxseed, Linseed, (FLAX SEED OIL PO) Take by mouth daily.        [name] NEXIUM 40 MG capsule TAKE 1 CAPSULE BY MOUTH ONCE DAILY  90 capsule  1  . Tamsulosin HCl (FLOMAX) 0.4 MG CAPS Take 0.4 mg by mouth.        . traMADol (ULTRAM) 50 MG tablet TAKE 1 TABLET BY MOUTH EVERY 6 HOURS AS NEEDED FOR PAIN  30 tablet  1  . meloxicam (MOBIC) 15 MG tablet Take 15 mg by mouth daily.           Review of Systems As above   Objective:     Filed Vitals:   03/09/12 1604  BP: 94/62  Pulse: 73  Temp:    Gen-- AAOx3 nad heent-- tmi b/l             Nose normal             Perla, eomi Cor-- s1s2 normal Lungs--  Ctab/l Neuro-- unsteady on feet,  Walks with assistance          Assessment:    dizziness---  Increase falls,  hypotension   Plan:    mri brain secondary to hx cva Check labs Cardio referral

## 2012-03-12 ENCOUNTER — Encounter: Payer: Self-pay | Admitting: Family Medicine

## 2012-03-13 NOTE — Addendum Note (Signed)
Addended by: Ewing Schlein on: 03/13/2012 08:07 AM   Modules accepted: Orders

## 2012-03-14 ENCOUNTER — Encounter: Payer: Self-pay | Admitting: *Deleted

## 2012-03-15 ENCOUNTER — Encounter: Payer: Self-pay | Admitting: Cardiology

## 2012-03-15 ENCOUNTER — Ambulatory Visit (INDEPENDENT_AMBULATORY_CARE_PROVIDER_SITE_OTHER): Payer: 59 | Admitting: Cardiology

## 2012-03-15 VITALS — BP 115/80 | HR 70 | Ht 71.0 in | Wt 226.0 lb

## 2012-03-15 DIAGNOSIS — E785 Hyperlipidemia, unspecified: Secondary | ICD-10-CM

## 2012-03-15 DIAGNOSIS — R0609 Other forms of dyspnea: Secondary | ICD-10-CM

## 2012-03-15 DIAGNOSIS — R42 Dizziness and giddiness: Secondary | ICD-10-CM

## 2012-03-15 DIAGNOSIS — I6529 Occlusion and stenosis of unspecified carotid artery: Secondary | ICD-10-CM

## 2012-03-15 DIAGNOSIS — R0600 Dyspnea, unspecified: Secondary | ICD-10-CM

## 2012-03-15 NOTE — Assessment & Plan Note (Addendum)
Patient is having orthostatic symptoms. Blood pressure lying today was 137/76 and standing was 105/69. He has not drinking much fluid. I've encouraged him to increase by mouth fluid intake and liberalize sodium intake. If he continues to have symptoms despite above we will consider Florinef or midodrine. There are no murmurs on examination. He does have mild dyspnea with more extreme activities and a history of cerebrovascular disease. He has had previous bilateral carotid endarterectomy. I will plan a Myoview to screen for ischemia and to quantify LV function.

## 2012-03-15 NOTE — Patient Instructions (Addendum)
Your physician recommends that you schedule a follow-up appointment in: 8-12 [place] physician has requested that you have a lexiscan myoview. For further information please visit [email or web address]. Please follow instruction sheet, as given.   INCREASE FLUIDS AND SALT INTAKE

## 2012-03-15 NOTE — Progress Notes (Signed)
HPI: 71 year old male for evaluation of dizziness. 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 Chronic microvascular ischemic changes noted. Laboratories in September of 2013 showed a potassium of 3.9 and a BUN/creatinine of 27 and 1.8. Hemoglobin 12.7. Over the past 3 weeks he has noticed dizziness when standing suddenly. It resolves over 1-2 minutes. One episode caused him to fall but there was no frank syncope. He has dyspnea with more extreme activities but not routine activities. No orthopnea, PND, pedal edema, palpitations or exertional chest pain. Because of the above we were asked to evaluate. Note he is not drinking much fluid and limits his sodium intake.  Current Outpatient Prescriptions  Medication Sig Dispense Refill  . ABILIFY 10 MG tablet TAKE 1 TABLET BY MOUTH DAILY  90 tablet  3  . aspirin 325 MG tablet Take 325 mg by mouth daily.        . B Complex Vitamins (VITAMIN B COMPLEX PO) Take by mouth daily.        . Calcium Carb-Cholecalciferol (CALCIUM 1000 + D PO) Take by mouth daily.        . Cholecalciferol (VITAMIN D3) 1000 UNITS CAPS Take by mouth daily.        . CRESTOR 40 MG tablet TAKE 1 TABLET BY MOUTH AT BEDTIME  90 tablet  1  . escitalopram (LEXAPRO) 20 MG tablet Take 1 tablet (20 mg total) by mouth daily.  90 tablet  1  . esomeprazole (NEXIUM) 40 MG capsule       . fenofibrate 160 MG tablet TAKE 1 TABLET BY MOUTH DAILY  90 tablet  3  . Flaxseed, Linseed, (FLAX SEED OIL PO) Take by mouth daily.        . Tamsulosin HCl (FLOMAX) 0.4 MG CAPS Take 0.4 mg by mouth.        . traMADol (ULTRAM) 50 MG tablet TAKE 1 TABLET BY MOUTH EVERY 6 HOURS AS NEEDED FOR PAIN  30 tablet  1  . DISCONTD: NEXIUM 40 MG capsule TAKE 1 CAPSULE BY MOUTH ONCE DAILY  90 capsule  1    Allergies  Allergen Reactions  . Adhesive (Tape)   . Latex     Reaction to Plastic Tape    Past Medical History  Diagnosis Date  . Sebaceous cyst   . Arthritis   . Hyperlipidemia   . Stroke    . Unspecified vitamin D deficiency   . ANXIETY   . CAROTID ARTERY STENOSIS, BILATERAL   . Unspecified cerebral artery occlusion with cerebral infarction   . ECZEMA, ATOPIC   . Urinary frequency     Past Surgical History  Procedure Date  . Inguinal hernia repair 11/08/2006    Dr Grandville Silos  . Carotid endarterectomy 11/2008, 2006    Dr Amedeo Plenty  . Appendectomy     History   Social History  . Marital Status: Married    Spouse Name: N/A    Number of Children: 2  . Years of Education: N/A   Occupational History  .     Social History Main Topics  . Smoking status: Former Smoker -- 1.0 packs/day for 40 years    Types: Cigarettes  . Smokeless tobacco: Former Systems developer    Quit date: 10/18/2004   Comment: quit 2006  . Alcohol Use: 1.2 oz/week    2 Glasses of wine per week     Occasional  . Drug Use: No  . Sexually Active: Yes -- Male partner(s)  Other Topics Concern  . Not on file   Social History Narrative   Exercise--  Walks dogs everyday    Family History  Problem Relation Age of Onset  . Heart disease Mother     CHF  . Heart disease Father     CHF    ROS:  no fevers or chills, productive cough, hemoptysis, dysphasia, odynophagia, melena, hematochezia, dysuria, hematuria, rash, seizure activity, orthopnea, PND, pedal edema, claudication. Remaining systems are negative.  Physical Exam:   Blood pressure 115/80, pulse 70, height 5\' 11"  (1.803 m), weight 226 lb (102.513 kg).  General:  Well developed/well nourished in NAD Skin warm/dry Patient not depressed No peripheral clubbing Back-normal HEENT-normal/normal eyelids Neck supple/normal carotid upstroke bilaterally; no bruits; no JVD; no thyromegaly chest - CTA/ normal expansion CV - RRR/normal S1 and S2; no murmurs, rubs or gallops;  PMI nondisplaced Abdomen -NT/ND, no HSM, no mass, + bowel sounds, no bruit 2+ femoral pulses, no bruits Ext-no edema, chords, 2+ DP Neuro-residual weakness and left upper and  lower extremity from previous CVA.  ECG 03/09/2012-sinus rhythm with PACs.

## 2012-03-15 NOTE — Assessment & Plan Note (Signed)
Continue aspirin and statin. 

## 2012-03-15 NOTE — Assessment & Plan Note (Signed)
Continue statin. 

## 2012-03-21 ENCOUNTER — Other Ambulatory Visit: Payer: Self-pay | Admitting: Family Medicine

## 2012-03-21 NOTE — Telephone Encounter (Signed)
Last seen 03/09/12 and filled 02/15/12 # 30 with 1 refill. Please advise      KP

## 2012-03-27 ENCOUNTER — Ambulatory Visit (HOSPITAL_COMMUNITY): Payer: 59 | Attending: Internal Medicine | Admitting: Radiology

## 2012-03-27 VITALS — BP 111/67 | HR 65 | Ht 71.0 in | Wt 204.0 lb

## 2012-03-27 DIAGNOSIS — R0609 Other forms of dyspnea: Secondary | ICD-10-CM | POA: Insufficient documentation

## 2012-03-27 DIAGNOSIS — R0989 Other specified symptoms and signs involving the circulatory and respiratory systems: Secondary | ICD-10-CM | POA: Insufficient documentation

## 2012-03-27 DIAGNOSIS — R42 Dizziness and giddiness: Secondary | ICD-10-CM | POA: Insufficient documentation

## 2012-03-27 DIAGNOSIS — R0602 Shortness of breath: Secondary | ICD-10-CM

## 2012-03-27 DIAGNOSIS — R079 Chest pain, unspecified: Secondary | ICD-10-CM

## 2012-03-27 DIAGNOSIS — R0600 Dyspnea, unspecified: Secondary | ICD-10-CM

## 2012-03-27 MED ORDER — TECHNETIUM TC 99M SESTAMIBI GENERIC - CARDIOLITE
11.0000 | Freq: Once | INTRAVENOUS | Status: AC | PRN
  Administered 2012-03-27: 11 via INTRAVENOUS

## 2012-03-27 MED ORDER — TECHNETIUM TC 99M SESTAMIBI GENERIC - CARDIOLITE
33.0000 | Freq: Once | INTRAVENOUS | Status: AC | PRN
  Administered 2012-03-27: 33 via INTRAVENOUS

## 2012-03-27 MED ORDER — REGADENOSON 0.4 MG/5ML IV SOLN
0.4000 mg | Freq: Once | INTRAVENOUS | Status: AC
  Administered 2012-03-27: 0.4 mg via INTRAVENOUS

## 2012-03-27 NOTE — Progress Notes (Signed)
[facility] [address] [id number] [name] [address] [phone]  Cardiology Nuclear Med Study  William [name] is a 71 y.o. male     MRN : [id number]     DOB: May 24, 1941  Procedure Date: 03/27/2012  Nuclear Med Background Indication for Stress Test:  Evaluation for Ischemia History:  No previous documented CAD. Cardiac Risk Factors: Carotid Disease, CVA, History of Smoking and Lipids  Symptoms:  Dizziness, DOE and Fatigue   Nuclear Pre-Procedure Caffeine/Decaff Intake:  None > 12 hrs NPO After: 5:30pm   Lungs:  Clear. O2 Sat: 98% on room air. IV 0.9% NS with Angio Cath:  22g  IV Site: R Antecubital x 1, tolerated well IV Started by:  Irven Baltimore, RN  Chest Size (in):  44 Cup Size: n/a  Height: 5\' 11"  (1.803 m)  Weight:  204 lb (92.534 kg)  BMI:  Body mass index is 28.45 kg/(m^2). Tech Comments:  n/a    Nuclear Med Study 1 or 2 day study: 1 day  Stress Test Type:  Treadmill/Lexiscan  Reading MD: Dorris Carnes, MD  Order Authorizing Provider:  Kirk Ruths, MD  Resting Radionuclide: Technetium 55m Sestamibi  Resting Radionuclide Dose: 11.0 mCi   Stress Radionuclide:  Technetium 40m Sestamibi  Stress Radionuclide Dose: 33.0 mCi           Stress Protocol Rest HR: 65 Stress HR: 109  Rest BP: 111/67 Stress BP: 114/54  Exercise Time (min): 2:00 METS: n/a   Predicted Max HR: 151 bpm % Max HR: 72.19 bpm Rate Pressure Product: 12426   Dose of Adenosine (mg):  n/a Dose of Lexiscan: 0.4 mg  Dose of Atropine (mg): n/a Dose of Dobutamine: n/a mcg/kg/min (at max HR)  Stress Test Technologist: Letta Moynahan, CMA-N  Nuclear Technologist:  Charlton Amor, CNMT     Rest Procedure:  Myocardial perfusion imaging was performed at rest 45 minutes following the intravenous administration of Technetium 45m Sestamibi.  Rest ECG: No acute changes with occasional PVC.  Stress Procedure:  The patient received IV Lexiscan 0.4 mg over  15-seconds with concurrent low level exercise and then Technetium 21m Sestamibi was injected at 30-seconds while the patient continued walking one more minute. There were no significant changes with Lexiscan. Quantitative spect images were obtained after a 45-minute delay.  Stress ECG: No significant change from baseline ECG  QPS Raw Data Images:  Images were motion corrected.  SOft tissue (diaphragm, bowel activity) underlie inferior wall. Stress Images:  Thinning with decreased tracer activity in the inferior wall (base, minimally mid); inferoseptal wall (base).  Otherwise normal perfusion. Rest Images:  No significant improvement from the stress images; changes actually more prominent. Subtraction (SDS):  No evidence of ischemia. Transient Ischemic Dilatation (Normal <1.22):  1.11 Lung/Heart Ratio (Normal <0.45):  0.33  Quantitative Gated Spect Images QGS EDV:  88 ml QGS ESV:  43 ml  Impression Exercise Capacity:  Lexiscan with low level exercise. BP Response:  Normal blood pressure response. Clinical Symptoms:  No chest pain. ECG Impression:  No significant ST segment change suggestive of ischemia. Comparison with Prior Nuclear Study: No previous nuclear study performed  Overall Impression:  Proabable normal perfusion and soft tissue attenuation.  No evidence for significant ischemia.  Low risk scan.  LV Ejection Fraction: 51%.  LV Wall Motion:  Normal thickening.  Dorris Carnes

## 2012-03-29 ENCOUNTER — Encounter: Payer: Self-pay | Admitting: Family Medicine

## 2012-04-17 ENCOUNTER — Encounter: Payer: Self-pay | Admitting: Family Medicine

## 2012-04-17 ENCOUNTER — Ambulatory Visit (INDEPENDENT_AMBULATORY_CARE_PROVIDER_SITE_OTHER): Payer: 59 | Admitting: Family Medicine

## 2012-04-17 VITALS — BP 112/72 | HR 64 | Temp 99.0°F | Wt 206.4 lb

## 2012-04-17 DIAGNOSIS — F29 Unspecified psychosis not due to a substance or known physiological condition: Secondary | ICD-10-CM

## 2012-04-17 DIAGNOSIS — R42 Dizziness and giddiness: Secondary | ICD-10-CM

## 2012-04-17 DIAGNOSIS — R410 Disorientation, unspecified: Secondary | ICD-10-CM

## 2012-04-17 DIAGNOSIS — I951 Orthostatic hypotension: Secondary | ICD-10-CM

## 2012-04-17 DIAGNOSIS — N289 Disorder of kidney and ureter, unspecified: Secondary | ICD-10-CM

## 2012-04-17 DIAGNOSIS — F341 Dysthymic disorder: Secondary | ICD-10-CM

## 2012-04-17 MED ORDER — MELOXICAM 15 MG PO TABS: ORAL_TABLET | ORAL | Status: DC

## 2012-04-17 NOTE — Assessment & Plan Note (Signed)
Orthostatic hypotension ----  Better today abilify can lower bp-- will wean off--- keep cardio appointment

## 2012-04-17 NOTE — Assessment & Plan Note (Signed)
May be from low bp-- wean off abilify F/u cardio F/u neuro

## 2012-04-17 NOTE — Progress Notes (Signed)
  Subjective:    Patient ID: William [name], male    DOB: 1940/08/14, 71 y.o.   MRN: [id number]  HPI Pt here f/u from cardiology ov.  Pt dx orthostatic hypotension and as told to inc po fluids and salt and f/u 8 weeks.   Pt is still dizzy and wife states he gets confused.     Review of Systems As above    Objective:   Physical Exam  Constitutional: He appears well-developed and well-nourished.  Neck: Normal range of motion. Neck supple.  Cardiovascular: Normal rate and regular rhythm.   Pulmonary/Chest: Effort normal and breath sounds normal.  Neurological: He is alert.  Psychiatric: His behavior is normal. Judgment and thought content normal.       Flat affect          Assessment & Plan:

## 2012-04-17 NOTE — Patient Instructions (Addendum)
Orthostatic Hypotension Orthostatic hypotension is a sudden fall in blood pressure. It occurs when a person goes from a sitting or lying position to a standing position. CAUSES   Loss of body fluids (dehydration).  Medicines that lower blood pressure.  Sudden changes in posture, such as sudden standing when you have been sitting or lying down.  Taking too much of your medicine. SYMPTOMS   Lightheadedness or dizziness.  Fainting or near-fainting.  A fast heart rate (tachycardia).  Weakness.  Feeling tired (fatigue). DIAGNOSIS  Your caregiver may find the cause of orthostatic hypotension through:  A history and/or physical exam.  Checking your blood pressure. Your caregiver will check your blood pressure when you are:  Lying down.  Sitting.  Standing.  Tilt table testing. In this test, you are placed on a table that goes from a lying position to a standing position. You will be strapped to the table. This test helps to monitor your blood pressure and heart rate when you are in different positions. TREATMENT   If orthostatic hypotension is caused by your medicines, your caregiver will need to adjust your dosage. Do not stop or adjust your medicine on your own.  When changing positions, make these changes slowly. This allows your body to adjust to the different position.  Compression stockings that are worn on your lower legs may be helpful.  Your caregiver may have you consume extra salt. Do not add extra salt to your diet unless directed by your caregiver.  Eat frequent, small meals. Avoid sudden standing after eating.  Avoid hot showers or excessive heat.  Your caregiver may give you fluids through the vein (intravenous).  Your caregiver may put you on medicine to help enhance fluid retention. SEEK IMMEDIATE MEDICAL CARE IF:   You faint or have a near-fainting episode. Call your local emergency services (911 in [place]  You have or develop chest pain.  You  feel sick to your stomach (nauseous) or vomit.  You have a loss of feeling or movement in your arms or legs.  You have difficulty talking, slurred speech, or you are unable to talk.  You have difficulty thinking or have confused thinking. MAKE SURE YOU:   Understand these instructions.  Will watch your condition.  Will get help right away if you are not doing well or get worse. Document Released: 05/21/2002 Document Revised: 08/23/2011 Document Reviewed: 09/13/2008 [facility] Patient Information 2013 [place].   WEAN OFF ABILIFY-----SAMPLES WERE GIVEN TO YOU

## 2012-04-17 NOTE — Assessment & Plan Note (Signed)
May be from low bp and inc confusion Wean off abilify con't lexapro

## 2012-04-18 ENCOUNTER — Encounter: Payer: Self-pay | Admitting: Vascular Surgery

## 2012-05-01 ENCOUNTER — Other Ambulatory Visit: Payer: Self-pay | Admitting: Family Medicine

## 2012-05-03 ENCOUNTER — Other Ambulatory Visit: Payer: Self-pay | Admitting: Family Medicine

## 2012-05-03 NOTE — Telephone Encounter (Signed)
Rx sent.    MW 

## 2012-05-04 ENCOUNTER — Other Ambulatory Visit (INDEPENDENT_AMBULATORY_CARE_PROVIDER_SITE_OTHER): Payer: 59

## 2012-05-04 DIAGNOSIS — N289 Disorder of kidney and ureter, unspecified: Secondary | ICD-10-CM

## 2012-05-04 LAB — BASIC METABOLIC PANEL
Calcium: 9.4 mg/dL (ref 8.4–10.5)
Creatinine, Ser: 1.5 mg/dL (ref 0.4–1.5)

## 2012-05-07 ENCOUNTER — Encounter: Payer: Self-pay | Admitting: Family Medicine

## 2012-05-08 ENCOUNTER — Encounter: Payer: Self-pay | Admitting: Family Medicine

## 2012-05-08 MED ORDER — ALPRAZOLAM 0.25 MG PO TABS: 0.2500 mg | ORAL_TABLET | Freq: Three times a day (TID) | ORAL | Status: DC | PRN

## 2012-05-17 ENCOUNTER — Encounter: Payer: Self-pay | Admitting: Family Medicine

## 2012-05-18 ENCOUNTER — Other Ambulatory Visit: Payer: Self-pay | Admitting: *Deleted

## 2012-05-18 ENCOUNTER — Encounter: Payer: Self-pay | Admitting: Neurosurgery

## 2012-05-18 ENCOUNTER — Other Ambulatory Visit: Payer: Self-pay | Admitting: Family Medicine

## 2012-05-18 DIAGNOSIS — I6529 Occlusion and stenosis of unspecified carotid artery: Secondary | ICD-10-CM

## 2012-05-18 DIAGNOSIS — Z48812 Encounter for surgical aftercare following surgery on the circulatory system: Secondary | ICD-10-CM

## 2012-05-18 MED ORDER — PANTOPRAZOLE SODIUM 40 MG PO TBEC: 40.0000 mg | DELAYED_RELEASE_TABLET | Freq: Every day | ORAL | Status: DC

## 2012-05-19 ENCOUNTER — Ambulatory Visit (INDEPENDENT_AMBULATORY_CARE_PROVIDER_SITE_OTHER): Payer: 59 | Admitting: Neurosurgery

## 2012-05-19 ENCOUNTER — Encounter: Payer: Self-pay | Admitting: Neurosurgery

## 2012-05-19 ENCOUNTER — Other Ambulatory Visit (INDEPENDENT_AMBULATORY_CARE_PROVIDER_SITE_OTHER): Payer: 59 | Admitting: *Deleted

## 2012-05-19 VITALS — BP 116/79 | HR 59 | Resp 18 | Ht 71.0 in | Wt 211.3 lb

## 2012-05-19 DIAGNOSIS — Z48812 Encounter for surgical aftercare following surgery on the circulatory system: Secondary | ICD-10-CM

## 2012-05-19 DIAGNOSIS — I6529 Occlusion and stenosis of unspecified carotid artery: Secondary | ICD-10-CM

## 2012-05-19 NOTE — Progress Notes (Signed)
[facility]  Carotid Office Note  CC: Carotid surveillance Referring Physician: Fields  History of Present Illness: 71 year old male patient of Dr. Oneida Alar status post right CEA in 2006, left CEA in 2010. The patient denies any signs or symptoms of CVA, TIA, amaurosis fugax or any neural deficit. The patient denies any new medical diagnoses or recent surgery.  Past Medical History  Diagnosis Date  . Sebaceous cyst   . Arthritis   . Hyperlipidemia   . Stroke   . Unspecified vitamin D deficiency   . ANXIETY   . CAROTID ARTERY STENOSIS, BILATERAL   . Unspecified cerebral artery occlusion with cerebral infarction   . ECZEMA, ATOPIC   . Urinary frequency     ROS: [x]  Positive   [ ]  Denies    General: [ ]  Weight loss, [ ]  Fever, [ ]  chills Neurologic: [ ]  Dizziness, [ ]  Blackouts, [ ]  Seizure [ ]  Stroke, [ ]  "Mini stroke", [ ]  Slurred speech, [ ]  Temporary blindness; [ ]  weakness in arms or legs, [ ]  Hoarseness Cardiac: [ ]  Chest pain/pressure, [ ]  Shortness of breath at rest [ ]  Shortness of breath with exertion, [ ]  Atrial fibrillation or irregular heartbeat Vascular: [ ]  Pain in legs with walking, [ ]  Pain in legs at rest, [ ]  Pain in legs at night,  [ ]  Non-healing ulcer, [ ]  Blood clot in vein/DVT,   Pulmonary: [ ]  Home oxygen, [ ]  Productive cough, [ ]  Coughing up blood, [ ]  Asthma,  [ ]  Wheezing Musculoskeletal:  [ ]  Arthritis, [ ]  Low back pain, [ ]  Joint pain Hematologic: [ ]  Easy Bruising, [ ]  Anemia; [ ]  Hepatitis Gastrointestinal: [ ]  Blood in stool, [ ]  Gastroesophageal Reflux/heartburn, [ ]  Trouble swallowing Urinary: [ ]  chronic Kidney disease, [ ]  on HD - [ ]  MWF or [ ]  TTHS, [ ]  Burning with urination, [ ]  Difficulty urinating Skin: [ ]  Rashes, [ ]  Wounds Psychological: [ ]  Anxiety, [ ]  Depression   Social History History  Substance Use Topics  . Smoking status: Former Smoker -- 1.0 packs/day for 40 years    Types: Cigarettes  .  Smokeless tobacco: Former Systems developer    Quit date: 10/18/2004     Comment: quit 2006  . Alcohol Use: 1.2 oz/week    2 Glasses of wine per week     Comment: Occasional    Family History Family History  Problem Relation Age of Onset  . Heart disease Mother     CHF  . Heart disease Father     CHF    Allergies  Allergen Reactions  . Adhesive (Tape)   . Latex     Reaction to Plastic Tape    Current Outpatient Prescriptions  Medication Sig Dispense Refill  . ALPRAZolam (XANAX) 0.25 MG tablet Take 1 tablet (0.25 mg total) by mouth 3 (three) times daily as needed for sleep.  30 tablet  0  . aspirin 325 MG tablet Take 325 mg by mouth daily.        . B Complex Vitamins (VITAMIN B COMPLEX PO) Take by mouth daily.        . Calcium Carb-Cholecalciferol (CALCIUM 1000 + D PO) Take by mouth daily.        . Cholecalciferol (VITAMIN D3) 1000 UNITS CAPS Take by mouth daily.        . CRESTOR 40 MG tablet TAKE 1 TABLET BY MOUTH AT BEDTIME  90 tablet  1  . escitalopram (LEXAPRO) 20 MG tablet Take 1 tablet (20 mg total) by mouth daily.  90 tablet  1  . esomeprazole (NEXIUM) 40 MG capsule       . fenofibrate 160 MG tablet TAKE 1 TABLET BY MOUTH DAILY  90 tablet  3  . Flaxseed, Linseed, (FLAX SEED OIL PO) Take by mouth daily.        . meloxicam (MOBIC) 15 MG tablet 1/2- 1 po qd prn  30 tablet  2  . pantoprazole (PROTONIX) 40 MG tablet Take 1 tablet (40 mg total) by mouth daily.  90 tablet  3  . Tamsulosin HCl (FLOMAX) 0.4 MG CAPS Take 0.4 mg by mouth.        [name] NEXIUM 40 MG capsule TAKE 1 CAPSULE BY MOUTH ONCE DAILY  90 capsule  1    Physical Examination  Filed Vitals:   05/19/12 1107  BP: 116/79  Pulse: 59  Resp:     Body mass index is 29.47 kg/(m^2).  General:  WDWN in NAD Gait: Normal HEENT: WNL Eyes: Pupils equal Pulmonary: normal non-labored breathing , without Rales, rhonchi,  wheezing Cardiac: RRR, without  Murmurs, rubs or gallops; Abdomen: soft, NT, no masses Skin: no rashes,  ulcers noted  Vascular Exam Pulses: 3+ radial pulses Carotid bruits: Carotid pulses to auscultation no bruits are heard Extremities without ischemic changes, no Gangrene , no cellulitis; no open wounds;  Musculoskeletal: no muscle wasting or atrophy   Neurologic: A&O X 3; Appropriate Affect ; SENSATION: normal; MOTOR FUNCTION:  moving all extremities equally. Speech is fluent/normal  Non-Invasive Vascular Imaging CAROTID DUPLEX 05/19/2012  Right ICA 0 - 19% stenosis Left ICA 0 - 19% stenosis   ASSESSMENT/PLAN: Asymptomatic patient with patent bilateral CEAs. The patient will followup in one year with repeat carotid duplex. The patient's questions were encouraged and answered, he is in agreement with this plan.  Beatris Ship ANP   Clinic MD: Bridgett Larsson

## 2012-05-19 NOTE — Addendum Note (Signed)
Addended by: Mena Goes on: 05/19/2012 03:05 PM   Modules accepted: Orders

## 2012-05-24 ENCOUNTER — Encounter: Payer: Self-pay | Admitting: Cardiology

## 2012-05-24 ENCOUNTER — Ambulatory Visit (INDEPENDENT_AMBULATORY_CARE_PROVIDER_SITE_OTHER): Payer: 59 | Admitting: Cardiology

## 2012-05-24 VITALS — BP 152/80 | HR 60 | Ht 71.0 in | Wt 215.0 lb

## 2012-05-24 DIAGNOSIS — R42 Dizziness and giddiness: Secondary | ICD-10-CM

## 2012-05-24 DIAGNOSIS — I6529 Occlusion and stenosis of unspecified carotid artery: Secondary | ICD-10-CM

## 2012-05-24 DIAGNOSIS — E785 Hyperlipidemia, unspecified: Secondary | ICD-10-CM

## 2012-05-24 NOTE — Progress Notes (Signed)
HPI: 71 year old male I initially saw in October of 2013 for evaluation of dizziness. 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 Chronic microvascular ischemic changes noted. Laboratories in September of 2013 showed a potassium of 3.9 and a BUN/creatinine of 27 and 1.8. Hemoglobin 12.7. Myoview in October of 2013 showed soft tissue attenuation but probably normal perfusion. Ejection fraction 51%. Patient followed by vascular surgery for cerebrovascular disease. At time of last visit patient was encouraged to increase by mouth fluid intake. Since that time his symptoms have improved. He has mild dizziness with standing at times but no syncope. He denies any dyspnea, chest pain or palpitations.   Current Outpatient Prescriptions  Medication Sig Dispense Refill  . ALPRAZolam (XANAX) 0.25 MG tablet Take 1 tablet (0.25 mg total) by mouth 3 (three) times daily as needed for sleep.  30 tablet  0  . aspirin 325 MG tablet Take 325 mg by mouth daily.        . B Complex Vitamins (VITAMIN B COMPLEX PO) Take by mouth daily.        . Calcium Carb-Cholecalciferol (CALCIUM 1000 + D PO) Take by mouth daily.        . Cholecalciferol (VITAMIN D3) 1000 UNITS CAPS Take by mouth daily.        . CRESTOR 40 MG tablet TAKE 1 TABLET BY MOUTH AT BEDTIME  90 tablet  1  . escitalopram (LEXAPRO) 20 MG tablet Take 1 tablet (20 mg total) by mouth daily.  90 tablet  1  . esomeprazole (NEXIUM) 40 MG capsule Take 40 mg by mouth as needed.       . fenofibrate 160 MG tablet TAKE 1 TABLET BY MOUTH DAILY  90 tablet  3  . Flaxseed, Linseed, (FLAX SEED OIL PO) Take by mouth daily.        . meloxicam (MOBIC) 15 MG tablet 1 po qd      . Tamsulosin HCl (FLOMAX) 0.4 MG CAPS Take 0.4 mg by mouth.        . [DISCONTINUED] NEXIUM 40 MG capsule TAKE 1 CAPSULE BY MOUTH ONCE DAILY  90 capsule  1     Past Medical History  Diagnosis Date  . Sebaceous cyst   . Arthritis   . Hyperlipidemia   . Stroke   . Unspecified  vitamin D deficiency   . ANXIETY   . CAROTID ARTERY STENOSIS, BILATERAL   . Unspecified cerebral artery occlusion with cerebral infarction   . ECZEMA, ATOPIC   . Urinary frequency   . Normal cardiac stress test Nov. 2013    Past Surgical History  Procedure Date  . Inguinal hernia repair 11/08/2006    Dr Grandville Silos  . Carotid endarterectomy 11/2008, 2006    Dr Amedeo Plenty  . Appendectomy     History   Social History  . Marital Status: Married    Spouse Name: N/A    Number of Children: 2  . Years of Education: N/A   Occupational History  .     Social History Main Topics  . Smoking status: Former Smoker -- 1.0 packs/day for 40 years    Types: Cigarettes  . Smokeless tobacco: Former Systems developer    Quit date: 10/18/2004     Comment: quit 2006  . Alcohol Use: 1.2 oz/week    2 Glasses of wine per week     Comment: Occasional  . Drug Use: No  . Sexually Active: Yes -- Male partner(s)   Other  Topics Concern  . Not on file   Social History Narrative   Exercise--  Walks dogs everyday    ROS: no fevers or chills, productive cough, hemoptysis, dysphasia, odynophagia, melena, hematochezia, dysuria, hematuria, rash, seizure activity, orthopnea, PND, pedal edema, claudication. Remaining systems are negative.  Physical Exam: Well-developed well-nourished in no acute distress.  Skin is warm and dry.  HEENT is normal.  Neck is supple.  Chest is clear to auscultation with normal expansion.  Cardiovascular exam is regular rate and rhythm.  Abdominal exam nontender or distended. No masses palpated. Extremities show no edema.

## 2012-05-24 NOTE — Assessment & Plan Note (Signed)
Patient's orthostatic symptoms have much improved with hydration and increasing sodium intake. No plans for further evaluation at this time.

## 2012-05-24 NOTE — Assessment & Plan Note (Signed)
Continue statin. 

## 2012-05-24 NOTE — Patient Instructions (Addendum)
Your physician recommends that you schedule a follow-up appointment AS NEEDED.  

## 2012-05-24 NOTE — Assessment & Plan Note (Signed)
Continue aspirin and statin. Followed by vascular surgery. 

## 2012-06-05 ENCOUNTER — Encounter: Payer: 59 | Admitting: Surgery

## 2012-06-05 ENCOUNTER — Other Ambulatory Visit: Payer: 59

## 2012-06-14 DIAGNOSIS — N189 Chronic kidney disease, unspecified: Secondary | ICD-10-CM

## 2012-06-14 HISTORY — DX: Chronic kidney disease, unspecified: N18.9

## 2012-06-19 ENCOUNTER — Telehealth: Payer: Self-pay | Admitting: Family Medicine

## 2012-06-19 MED ORDER — ALPRAZOLAM 0.25 MG PO TABS: 0.2500 mg | ORAL_TABLET | Freq: Three times a day (TID) | ORAL | Status: DC | PRN

## 2012-06-19 NOTE — Telephone Encounter (Signed)
Refill: Alprazolam 0.25 mg tablet. Take 1 tablet by mouth 3 times daily as needed for sleep. Qty 30. Last fill 05-09-12

## 2012-06-19 NOTE — Telephone Encounter (Signed)
Refill x1   1 refill

## 2012-06-19 NOTE — Telephone Encounter (Signed)
Last seen 04/17/12 and filled 05/08/12 #30. Please advise     KP

## 2012-06-24 ENCOUNTER — Encounter: Payer: Self-pay | Admitting: Family Medicine

## 2012-06-26 ENCOUNTER — Other Ambulatory Visit: Payer: Self-pay | Admitting: Family Medicine

## 2012-06-26 MED ORDER — ARIPIPRAZOLE 5 MG PO TABS: ORAL_TABLET | ORAL | Status: DC

## 2012-07-02 ENCOUNTER — Encounter: Payer: Self-pay | Admitting: Family Medicine

## 2012-07-03 ENCOUNTER — Encounter: Payer: Self-pay | Admitting: Family Medicine

## 2012-07-24 ENCOUNTER — Other Ambulatory Visit: Payer: Self-pay | Admitting: Family Medicine

## 2012-07-24 DIAGNOSIS — M79609 Pain in unspecified limb: Secondary | ICD-10-CM

## 2012-07-24 NOTE — Telephone Encounter (Signed)
Refill for meloxicam sent to [facility] outpatient pharmacy

## 2012-08-03 ENCOUNTER — Encounter: Payer: Self-pay | Admitting: Family Medicine

## 2012-08-04 MED ORDER — ESOMEPRAZOLE MAGNESIUM 40 MG PO CPDR: 40.0000 mg | DELAYED_RELEASE_CAPSULE | Freq: Every day | ORAL | Status: DC

## 2012-08-21 ENCOUNTER — Other Ambulatory Visit: Payer: Self-pay | Admitting: Family Medicine

## 2012-08-21 NOTE — Telephone Encounter (Signed)
Last seen 04/17/12 and filled 06/19/12 #30 with 1 refill. Please advise    KP

## 2012-09-20 ENCOUNTER — Other Ambulatory Visit: Payer: Self-pay | Admitting: Family Medicine

## 2012-10-16 ENCOUNTER — Other Ambulatory Visit: Payer: Self-pay | Admitting: Family Medicine

## 2012-10-16 NOTE — Telephone Encounter (Signed)
Last seen 04/17/12 and filled 08/21/12 #30 with 1 refill. Please advise      KP

## 2012-10-25 ENCOUNTER — Other Ambulatory Visit: Payer: Self-pay | Admitting: Family Medicine

## 2012-10-27 ENCOUNTER — Telehealth: Payer: Self-pay | Admitting: *Deleted

## 2012-10-27 NOTE — Telephone Encounter (Signed)
Last OV 04-17-12, last filled 09-20-12 #30

## 2012-10-29 NOTE — Telephone Encounter (Signed)
i though pt stopped this

## 2012-10-30 MED ORDER — ARIPIPRAZOLE 5 MG PO TABS: 5.0000 mg | ORAL_TABLET | Freq: Every day | ORAL | Status: DC

## 2012-10-30 NOTE — Telephone Encounter (Signed)
Rx sent 

## 2012-10-30 NOTE — Telephone Encounter (Signed)
Pt states that med was not stopped it was cut back. Pt indicated that he is currently taking 5 mg.Please advise

## 2012-10-30 NOTE — Telephone Encounter (Signed)
Ok to fill #30  5 refills

## 2012-10-31 ENCOUNTER — Encounter: Payer: Self-pay | Admitting: Family Medicine

## 2012-11-01 ENCOUNTER — Telehealth: Payer: Self-pay

## 2012-11-01 DIAGNOSIS — E785 Hyperlipidemia, unspecified: Secondary | ICD-10-CM

## 2012-11-01 DIAGNOSIS — N400 Enlarged prostate without lower urinary tract symptoms: Secondary | ICD-10-CM

## 2012-11-01 NOTE — Telephone Encounter (Signed)
Message copied by Ewing Schlein on Wed Nov 01, 2012  8:55 AM ------      Message from: Dorian Pod      Created: Tue Oct 31, 2012  4:29 PM       Patients wife called to schedule labs due. Pt also requesting psa for enlarged prostate per Dr. Diona Fanti [phone]. Can you place orders please? ------

## 2012-11-03 ENCOUNTER — Telehealth: Payer: Self-pay | Admitting: Family Medicine

## 2012-11-03 MED ORDER — ROSUVASTATIN CALCIUM 40 MG PO TABS: ORAL_TABLET | ORAL | Status: DC

## 2012-11-03 NOTE — Telephone Encounter (Signed)
Patient's wife states the pharmacy has been trying to contact us regarding his crestor rx. He is completely out and would like this refilled today. [facility].

## 2012-11-03 NOTE — Telephone Encounter (Signed)
Med filled. Pt has an appt for labs on 5-28.

## 2012-11-07 ENCOUNTER — Encounter: Payer: Self-pay | Admitting: Lab

## 2012-11-08 ENCOUNTER — Other Ambulatory Visit (INDEPENDENT_AMBULATORY_CARE_PROVIDER_SITE_OTHER): Payer: 59

## 2012-11-08 DIAGNOSIS — N400 Enlarged prostate without lower urinary tract symptoms: Secondary | ICD-10-CM

## 2012-11-08 DIAGNOSIS — E785 Hyperlipidemia, unspecified: Secondary | ICD-10-CM

## 2012-11-08 LAB — LIPID PANEL
HDL: 55.7 mg/dL (ref >39.00)
LDL Cholesterol: 45 mg/dL (ref 0–99)
Total CHOL/HDL Ratio: 2
Triglycerides: 63 mg/dL (ref 0.0–149.0)

## 2012-11-08 LAB — BASIC METABOLIC PANEL
CO2: 22 mEq/L (ref 19–32)
Chloride: 108 mEq/L (ref 96–112)
Sodium: 139 mEq/L (ref 135–145)

## 2012-11-08 LAB — HEPATIC FUNCTION PANEL
Albumin: 3.9 g/dL (ref 3.5–5.2)
Alkaline Phosphatase: 51 U/L (ref 39–117)

## 2012-11-09 ENCOUNTER — Encounter: Payer: Self-pay | Admitting: Family Medicine

## 2012-11-23 ENCOUNTER — Other Ambulatory Visit: Payer: Self-pay | Admitting: Family Medicine

## 2012-11-23 ENCOUNTER — Encounter: Payer: Self-pay | Admitting: Family Medicine

## 2012-11-23 NOTE — Telephone Encounter (Signed)
Last seen 04/17/12 and filled 07/24/12 #30 with 3 rf. Please advise     KP

## 2012-11-24 ENCOUNTER — Encounter: Payer: Self-pay | Admitting: Family Medicine

## 2012-11-25 ENCOUNTER — Encounter: Payer: Self-pay | Admitting: Family Medicine

## 2012-11-27 ENCOUNTER — Other Ambulatory Visit: Payer: Self-pay | Admitting: Family Medicine

## 2012-11-27 MED ORDER — ALPRAZOLAM 0.25 MG PO TABS: ORAL_TABLET | ORAL | Status: DC

## 2012-12-05 ENCOUNTER — Other Ambulatory Visit: Payer: Self-pay

## 2012-12-05 MED ORDER — TAMSULOSIN HCL 0.4 MG PO CAPS: 0.4000 mg | ORAL_CAPSULE | Freq: Every day | ORAL | Status: DC

## 2012-12-25 ENCOUNTER — Other Ambulatory Visit: Payer: Self-pay | Admitting: Family Medicine

## 2012-12-25 NOTE — Telephone Encounter (Signed)
Last seen 04/17/12 and filled 11/27/12 #60. Please advise     KP

## 2013-01-18 ENCOUNTER — Encounter: Payer: Self-pay | Admitting: Family Medicine

## 2013-01-18 ENCOUNTER — Telehealth: Payer: Self-pay | Admitting: *Deleted

## 2013-01-18 MED ORDER — ALPRAZOLAM 0.25 MG PO TABS: ORAL_TABLET | ORAL | Status: DC

## 2013-01-18 NOTE — Telephone Encounter (Signed)
Ok to refill x1  2 refills

## 2013-01-18 NOTE — Telephone Encounter (Signed)
Rx has been printed and is waiting to be faxed. Per Dr. Christian Mate cma

## 2013-01-18 NOTE — Telephone Encounter (Signed)
Pharmacy has requested a refill of Alprazolam 0.25 mg  last ov 04/17/12 last date filled 12/25/12 Last UDS 11/08/12  patient is (low risk)  And she has a controlled substance contract on file.   Patient has also requested that this medication be ready by 01/19/13 because she is going out of town and she can come by and pick up 01/19/13 before she leaves. Please Advise AG cma

## 2013-01-19 ENCOUNTER — Telehealth: Payer: Self-pay | Admitting: Family Medicine

## 2013-01-19 MED ORDER — FENOFIBRATE 160 MG PO TABS: ORAL_TABLET | ORAL | Status: DC

## 2013-01-19 NOTE — Telephone Encounter (Signed)
Patients wife present to lobby requesting an early refill for patients Alprazolam. Called pharmacy and it was report that she was going out of town and that she had to get her husbands pill boxes ready before she left. Spoke with Dr. Etter Sjogren and she gave a verbal ok to refill 5 days early. Advised pharmacist.

## 2013-01-19 NOTE — Telephone Encounter (Signed)
William [name] with [facility] is calling to verify if they can fill the patient's Aprazolam rx today even though it will be early. His wife is at the pharmacy and states that they are going out of town. Please advise.

## 2013-02-15 ENCOUNTER — Encounter (HOSPITAL_BASED_OUTPATIENT_CLINIC_OR_DEPARTMENT_OTHER): Payer: Self-pay | Admitting: *Deleted

## 2013-02-15 ENCOUNTER — Other Ambulatory Visit: Payer: Self-pay | Admitting: Urology

## 2013-02-16 ENCOUNTER — Encounter (HOSPITAL_BASED_OUTPATIENT_CLINIC_OR_DEPARTMENT_OTHER): Payer: Self-pay | Admitting: *Deleted

## 2013-02-16 NOTE — Progress Notes (Signed)
NPO AFTER MN. ARRIVES AT 0630. NEEDS HG. CURRENT EKG IN EPIC AND CHART. WILL TAKE NEXIUM AM OF SURG W/ SIP OF WATER. REVIEWED [facility] GUIDELINES, WILL BRING MEDS.

## 2013-02-26 ENCOUNTER — Encounter (HOSPITAL_BASED_OUTPATIENT_CLINIC_OR_DEPARTMENT_OTHER): Payer: Self-pay | Admitting: Anesthesiology

## 2013-02-26 ENCOUNTER — Ambulatory Visit (HOSPITAL_BASED_OUTPATIENT_CLINIC_OR_DEPARTMENT_OTHER)
Admission: RE | Admit: 2013-02-26 | Discharge: 2013-02-27 | Disposition: A | Discharge status: Home / Self Care | Payer: 59 | Source: Ambulatory Visit | Attending: Urology | Admitting: Urology

## 2013-02-26 ENCOUNTER — Encounter (HOSPITAL_BASED_OUTPATIENT_CLINIC_OR_DEPARTMENT_OTHER): Payer: Self-pay | Admitting: *Deleted

## 2013-02-26 ENCOUNTER — Encounter (HOSPITAL_BASED_OUTPATIENT_CLINIC_OR_DEPARTMENT_OTHER)
Admission: RE | Disposition: A | Discharge status: Home / Self Care | Payer: Self-pay | Source: Ambulatory Visit | Attending: Urology

## 2013-02-26 ENCOUNTER — Ambulatory Visit (HOSPITAL_BASED_OUTPATIENT_CLINIC_OR_DEPARTMENT_OTHER): Payer: 59 | Admitting: Anesthesiology

## 2013-02-26 DIAGNOSIS — E785 Hyperlipidemia, unspecified: Secondary | ICD-10-CM | POA: Insufficient documentation

## 2013-02-26 DIAGNOSIS — N210 Calculus in bladder: Secondary | ICD-10-CM | POA: Insufficient documentation

## 2013-02-26 DIAGNOSIS — N401 Enlarged prostate with lower urinary tract symptoms: Secondary | ICD-10-CM | POA: Insufficient documentation

## 2013-02-26 DIAGNOSIS — N138 Other obstructive and reflux uropathy: Secondary | ICD-10-CM | POA: Insufficient documentation

## 2013-02-26 DIAGNOSIS — N320 Bladder-neck obstruction: Secondary | ICD-10-CM | POA: Insufficient documentation

## 2013-02-26 DIAGNOSIS — Z8673 Personal history of transient ischemic attack (TIA), and cerebral infarction without residual deficits: Secondary | ICD-10-CM | POA: Insufficient documentation

## 2013-02-26 DIAGNOSIS — K219 Gastro-esophageal reflux disease without esophagitis: Secondary | ICD-10-CM | POA: Insufficient documentation

## 2013-02-26 HISTORY — DX: Personal history of other diseases of the circulatory system: Z86.79

## 2013-02-26 HISTORY — DX: Calculus in bladder: N21.0

## 2013-02-26 HISTORY — DX: Benign prostatic hyperplasia without lower urinary tract symptoms: N40.0

## 2013-02-26 HISTORY — DX: Dermatitis, unspecified: L30.9

## 2013-02-26 HISTORY — DX: Vitamin D deficiency, unspecified: E55.9

## 2013-02-26 HISTORY — PX: TRANSURETHRAL RESECTION OF PROSTATE: SHX73

## 2013-02-26 HISTORY — DX: Other specified postprocedural states: Z98.890

## 2013-02-26 HISTORY — DX: Nocturia: R35.1

## 2013-02-26 HISTORY — PX: CYSTOSCOPY WITH LITHOLAPAXY: SHX1425

## 2013-02-26 HISTORY — DX: Cerebrovascular disease, unspecified: I67.9

## 2013-02-26 HISTORY — DX: Essential (primary) hypertension: I10

## 2013-02-26 HISTORY — DX: Personal history of transient ischemic attack (TIA), and cerebral infarction without residual deficits: Z86.73

## 2013-02-26 HISTORY — DX: Gastro-esophageal reflux disease without esophagitis: K21.9

## 2013-02-26 SURGERY — CYSTOSCOPY, WITH BLADDER CALCULUS LITHOLAPAXY
Anesthesia: General | Site: Prostate | Wound class: Clean Contaminated

## 2013-02-26 MED ORDER — ATORVASTATIN CALCIUM 10 MG PO TABS
10.0000 mg | ORAL_TABLET | Freq: Every day | ORAL | Status: DC
  Administered 2013-02-26: 10 mg via ORAL
  Filled 2013-02-26: qty 1

## 2013-02-26 MED ORDER — MEPERIDINE HCL 25 MG/ML IJ SOLN
6.2500 mg | INTRAMUSCULAR | Status: DC | PRN
  Filled 2013-02-26: qty 1

## 2013-02-26 MED ORDER — OXYCODONE HCL 5 MG PO TABS
5.0000 mg | ORAL_TABLET | Freq: Once | ORAL | Status: AC | PRN
  Filled 2013-02-26: qty 1

## 2013-02-26 MED ORDER — CIPROFLOXACIN IN D5W 400 MG/200ML IV SOLN
400.0000 mg | INTRAVENOUS | Status: AC
  Administered 2013-02-26 (×2): 400 mg via INTRAVENOUS
  Filled 2013-02-26: qty 200

## 2013-02-26 MED ORDER — LACTATED RINGERS IV SOLN
INTRAVENOUS | Status: DC
  Administered 2013-02-26: 07:00:00 via INTRAVENOUS
  Filled 2013-02-26: qty 1000

## 2013-02-26 MED ORDER — PROMETHAZINE HCL 25 MG/ML IJ SOLN
6.2500 mg | INTRAMUSCULAR | Status: DC | PRN
  Filled 2013-02-26: qty 1

## 2013-02-26 MED ORDER — SODIUM CHLORIDE 0.9 % IR SOLN
Status: DC | PRN
  Administered 2013-02-26: 15000 mL via INTRAVESICAL

## 2013-02-26 MED ORDER — BELLADONNA ALKALOIDS-OPIUM 16.2-60 MG RE SUPP
RECTAL | Status: DC | PRN
  Administered 2013-02-26: 1 via RECTAL

## 2013-02-26 MED ORDER — CIPROFLOXACIN HCL 250 MG PO TABS: 250.0000 mg | ORAL_TABLET | Freq: Two times a day (BID) | ORAL | Status: DC

## 2013-02-26 MED ORDER — ONDANSETRON HCL 4 MG/2ML IJ SOLN
4.0000 mg | INTRAMUSCULAR | Status: DC | PRN
  Filled 2013-02-26: qty 2

## 2013-02-26 MED ORDER — SODIUM CHLORIDE 0.9 % IR SOLN
3000.0000 mL | Status: DC
  Administered 2013-02-26: 3000 mL via INTRAVESICAL
  Filled 2013-02-26: qty 3000

## 2013-02-26 MED ORDER — PANTOPRAZOLE SODIUM 40 MG PO TBEC
40.0000 mg | DELAYED_RELEASE_TABLET | Freq: Every day | ORAL | Status: DC
  Filled 2013-02-26: qty 1

## 2013-02-26 MED ORDER — ZOLPIDEM TARTRATE 5 MG PO TABS
5.0000 mg | ORAL_TABLET | Freq: Every evening | ORAL | Status: DC | PRN
  Administered 2013-02-26: 5 mg via ORAL
  Filled 2013-02-26: qty 1

## 2013-02-26 MED ORDER — STERILE WATER FOR IRRIGATION IR SOLN
Status: DC | PRN
  Administered 2013-02-26: 1000 mL

## 2013-02-26 MED ORDER — SODIUM CHLORIDE 0.45 % IV SOLN
INTRAVENOUS | Status: DC
  Administered 2013-02-26 – 2013-02-27 (×2): via INTRAVENOUS
  Filled 2013-02-26: qty 1000

## 2013-02-26 MED ORDER — GLYCOPYRROLATE 0.2 MG/ML IJ SOLN
INTRAMUSCULAR | Status: DC | PRN
  Administered 2013-02-26: 0.2 mg via INTRAVENOUS

## 2013-02-26 MED ORDER — HYDROCODONE-ACETAMINOPHEN 5-325 MG PO TABS
1.0000 | ORAL_TABLET | ORAL | Status: DC | PRN
  Administered 2013-02-26: 1 via ORAL
  Filled 2013-02-26: qty 2

## 2013-02-26 MED ORDER — CIPROFLOXACIN HCL 250 MG PO TABS
250.0000 mg | ORAL_TABLET | Freq: Two times a day (BID) | ORAL | Status: AC
  Filled 2013-02-26: qty 1

## 2013-02-26 MED ORDER — ARIPIPRAZOLE 5 MG PO TABS
5.0000 mg | ORAL_TABLET | Freq: Every day | ORAL | Status: DC
  Filled 2013-02-26: qty 1

## 2013-02-26 MED ORDER — ONDANSETRON HCL 4 MG/2ML IJ SOLN
INTRAMUSCULAR | Status: DC | PRN
  Administered 2013-02-26: 4 mg via INTRAVENOUS

## 2013-02-26 MED ORDER — LIDOCAINE HCL (CARDIAC) 20 MG/ML IV SOLN
INTRAVENOUS | Status: DC | PRN
  Administered 2013-02-26: 60 mg via INTRAVENOUS

## 2013-02-26 MED ORDER — OXYCODONE HCL 5 MG/5ML PO SOLN
5.0000 mg | Freq: Once | ORAL | Status: AC | PRN
  Filled 2013-02-26: qty 5

## 2013-02-26 MED ORDER — BELLADONNA ALKALOIDS-OPIUM 16.2-60 MG RE SUPP
1.0000 | Freq: Four times a day (QID) | RECTAL | Status: DC | PRN
  Filled 2013-02-26: qty 1

## 2013-02-26 MED ORDER — LACTATED RINGERS IV SOLN
INTRAVENOUS | Status: DC | PRN
  Administered 2013-02-26 (×2): via INTRAVENOUS

## 2013-02-26 MED ORDER — ESCITALOPRAM OXALATE 20 MG PO TABS
20.0000 mg | ORAL_TABLET | Freq: Every day | ORAL | Status: DC
  Administered 2013-02-26: 20 mg via ORAL
  Filled 2013-02-26: qty 1

## 2013-02-26 MED ORDER — HYDROMORPHONE HCL PF 1 MG/ML IJ SOLN
0.2500 mg | INTRAMUSCULAR | Status: DC | PRN
  Filled 2013-02-26: qty 1

## 2013-02-26 MED ORDER — DOCUSATE SODIUM 100 MG PO CAPS
100.0000 mg | ORAL_CAPSULE | Freq: Two times a day (BID) | ORAL | Status: DC
  Administered 2013-02-26: 100 mg via ORAL
  Filled 2013-02-26: qty 1

## 2013-02-26 MED ORDER — PROPOFOL 10 MG/ML IV BOLUS
INTRAVENOUS | Status: DC | PRN
  Administered 2013-02-26: 180 mg via INTRAVENOUS

## 2013-02-26 MED ORDER — EPHEDRINE SULFATE 50 MG/ML IJ SOLN
INTRAMUSCULAR | Status: DC | PRN
  Administered 2013-02-26 (×3): 10 mg via INTRAVENOUS

## 2013-02-26 MED ORDER — KETOROLAC TROMETHAMINE 30 MG/ML IJ SOLN
INTRAMUSCULAR | Status: DC | PRN
  Administered 2013-02-26: 30 mg via INTRAVENOUS

## 2013-02-26 MED ORDER — FENOFIBRATE 160 MG PO TABS
160.0000 mg | ORAL_TABLET | Freq: Every day | ORAL | Status: DC
  Administered 2013-02-26: 160 mg via ORAL
  Filled 2013-02-26: qty 1

## 2013-02-26 MED ORDER — FENTANYL CITRATE 0.05 MG/ML IJ SOLN
INTRAMUSCULAR | Status: DC | PRN
  Administered 2013-02-26 (×2): 12.5 ug via INTRAVENOUS
  Administered 2013-02-26: 25 ug via INTRAVENOUS
  Administered 2013-02-26 (×7): 12.5 ug via INTRAVENOUS
  Administered 2013-02-26: 25 ug via INTRAVENOUS
  Administered 2013-02-26 (×3): 12.5 ug via INTRAVENOUS

## 2013-02-26 MED ORDER — ALPRAZOLAM 0.25 MG PO TABS
0.2500 mg | ORAL_TABLET | Freq: Two times a day (BID) | ORAL | Status: DC | PRN
  Administered 2013-02-26: 0.25 mg via ORAL
  Filled 2013-02-26: qty 1

## 2013-02-26 MED ORDER — DEXAMETHASONE SODIUM PHOSPHATE 4 MG/ML IJ SOLN
INTRAMUSCULAR | Status: DC | PRN
  Administered 2013-02-26: 8 mg via INTRAVENOUS

## 2013-02-26 SURGICAL SUPPLY — 62 items
ADAPTER CATH URET PLST 4-6FR (CATHETERS) IMPLANT
ADPR CATH URET STRL DISP 4-6FR (CATHETERS)
BAG DRAIN URO-CYSTO SKYTR STRL (DRAIN) ×3 IMPLANT
BAG DRN ANRFLXCHMBR STRAP LEK (BAG)
BAG DRN UROCATH (DRAIN) ×2
BAG URINE DRAINAGE (UROLOGICAL SUPPLIES) ×1 IMPLANT
BAG URINE LEG 19OZ MD ST LTX (BAG) IMPLANT
BASKET LASER NITINOL 1.9FR (BASKET) IMPLANT
BASKET STNLS GEMINI 4WIRE 3FR (BASKET) IMPLANT
BASKET ZERO TIP NITINOL 2.4FR (BASKET) IMPLANT
BSKT STON RTRVL 120 1.9FR (BASKET)
BSKT STON RTRVL GEM 120X11 3FR (BASKET)
BSKT STON RTRVL ZERO TP 2.4FR (BASKET)
CANISTER SUCT LVC 12 LTR MEDI- (MISCELLANEOUS) ×4 IMPLANT
CARTRIDGE STONEBREAK CO2 KIDNE (ELECTROSURGICAL) ×2 IMPLANT
CATH FOLEY 2WAY SLVR  5CC 20FR (CATHETERS)
CATH FOLEY 2WAY SLVR  5CC 22FR (CATHETERS)
CATH FOLEY 2WAY SLVR 30CC 22FR (CATHETERS) IMPLANT
CATH FOLEY 2WAY SLVR 5CC 20FR (CATHETERS) IMPLANT
CATH FOLEY 2WAY SLVR 5CC 22FR (CATHETERS) IMPLANT
CATH FOLEY 3WAY 30CC 22F (CATHETERS) ×1 IMPLANT
CATH HEMA 3WAY 30CC 24FR COUDE (CATHETERS) IMPLANT
CATH HEMA 3WAY 30CC 24FR RND (CATHETERS) IMPLANT
CATH INTERMIT  6FR 70CM (CATHETERS) IMPLANT
CATH URET 5FR 28IN CONE TIP (BALLOONS)
CATH URET 5FR 28IN OPEN ENDED (CATHETERS) IMPLANT
CATH URET 5FR 70CM CONE TIP (BALLOONS) IMPLANT
CLOTH BEACON ORANGE TIMEOUT ST (SAFETY) ×3 IMPLANT
DRAPE CAMERA CLOSED 9X96 (DRAPES) ×3 IMPLANT
ELECT BUTTON HF 24-28F 2 30DE (ELECTRODE) ×3 IMPLANT
ELECT LOOP MED HF 24F 12D CBL (CLIP) ×1 IMPLANT
ELECT REM PT RETURN 9FT ADLT (ELECTROSURGICAL) ×3
ELECTRODE REM PT RTRN 9FT ADLT (ELECTROSURGICAL) ×2 IMPLANT
ELECTROHYDROLIC PROBE 9FR (MISCELLANEOUS) IMPLANT
EVACUATOR MICROVAS BLADDER (UROLOGICAL SUPPLIES) ×1 IMPLANT
GLOVE BIO SURGEON STRL SZ8 (GLOVE) ×3 IMPLANT
GOWN PREVENTION PLUS LG XLONG (DISPOSABLE) ×3 IMPLANT
GOWN STRL REIN XL XLG (GOWN DISPOSABLE) ×4 IMPLANT
GOWN XL W/COTTON TOWEL STD (GOWNS) ×2 IMPLANT
GUIDEWIRE 0.038 PTFE COATED (WIRE) IMPLANT
GUIDEWIRE ANG ZIPWIRE 038X150 (WIRE) IMPLANT
GUIDEWIRE STR DUAL SENSOR (WIRE) IMPLANT
HOLDER FOLEY CATH W/STRAP (MISCELLANEOUS) ×1 IMPLANT
IV NS IRRIG 3000ML ARTHROMATIC (IV SOLUTION) ×5 IMPLANT
KIT ASPIRATION TUBING (SET/KITS/TRAYS/PACK) IMPLANT
KIT BALLIN UROMAX 15FX10 (LABEL) IMPLANT
KIT BALLN UROMAX 15FX4 (MISCELLANEOUS) IMPLANT
KIT BALLN UROMAX 26 75X4 (MISCELLANEOUS)
LASER FIBER DISP (UROLOGICAL SUPPLIES) IMPLANT
LASER FIBER DISP 1000U (UROLOGICAL SUPPLIES) IMPLANT
PACK CYSTOSCOPY (CUSTOM PROCEDURE TRAY) ×3 IMPLANT
PLUG CATH AND CAP STER (CATHETERS) IMPLANT
PROBE LITHO 3.3FR 2137.235 (UROLOGICAL SUPPLIES) IMPLANT
PROBE LITHO 5.0FR 2137.1505 (MISCELLANEOUS) IMPLANT
PROBE PNEUMATIC 1.6MM (ELECTROSURGICAL) ×1 IMPLANT
SET ASPIRATION TUBING (TUBING) IMPLANT
SET HIGH PRES BAL DIL (LABEL)
SHEATH URET ACCESS 12FR/35CM (UROLOGICAL SUPPLIES) IMPLANT
SHEATH URET ACCESS 12FR/55CM (UROLOGICAL SUPPLIES) IMPLANT
SYR 30ML LL (SYRINGE) IMPLANT
SYRINGE IRR TOOMEY STRL 70CC (SYRINGE) IMPLANT
WATER STERILE IRR 500ML POUR (IV SOLUTION) IMPLANT

## 2013-02-26 NOTE — Anesthesia Postprocedure Evaluation (Signed)
Anesthesia Post Note  Patient: William [name]  Procedure(s) Performed: Procedure(s) (LRB): CYSTOSCOPY WITH LITHOLAPAXY (N/A) TRANSURETHRAL RESECTION OF THE PROSTATE WITH GYRUS INSTRUMENTS (N/A)  Anesthesia type: General  Patient location: PACU  Post pain: Pain level controlled  Post assessment: Post-op Vital signs reviewed  Last Vitals: BP 147/66  Pulse 75  Temp(Src) 36.4 C (Oral)  Resp 13  Ht 5\' 11"  (1.803 m)  Wt 224 lb (101.606 kg)  BMI 31.26 kg/m2  SpO2 97%  Post vital signs: Reviewed  Level of consciousness: sedated  Complications: No apparent anesthesia complications

## 2013-02-26 NOTE — Anesthesia Preprocedure Evaluation (Addendum)
Anesthesia Evaluation  Patient identified by MRN, date of birth, ID band Patient awake    Reviewed: Allergy & Precautions, H&P , NPO status , Patient's Chart, lab work & pertinent test results  Airway Mallampati: II TM Distance: >3 FB Neck ROM: Full    Dental  (+) Dental Advisory Given, Missing and Poor Dentition   Pulmonary former smoker,  breath sounds clear to auscultation        Cardiovascular + Peripheral Vascular Disease negative cardio ROS  Rhythm:Regular Rate:Normal  Myocardial perfusion scan 03/2012 WNL   Neuro/Psych  Headaches, PSYCHIATRIC DISORDERS Anxiety Depression CVA negative neurological ROS     GI/Hepatic Neg liver ROS, GERD-  Medicated,  Endo/Other  negative endocrine ROS  Renal/GU negative Renal ROS     Musculoskeletal negative musculoskeletal ROS (+)   Abdominal (+) + obese,   Peds  Hematology negative hematology ROS (+)   Anesthesia Other Findings   Reproductive/Obstetrics                        Anesthesia Physical Anesthesia Plan  ASA: III  Anesthesia Plan: General   Post-op Pain Management:    Induction: Intravenous  Airway Management Planned: LMA  Additional Equipment:   Intra-op Plan:   Post-operative Plan: Extubation in OR  Informed Consent: I have reviewed the patients History and Physical, chart, labs and discussed the procedure including the risks, benefits and alternatives for the proposed anesthesia with the patient or authorized representative who has indicated his/her understanding and acceptance.   Dental advisory given  Plan Discussed with: CRNA  Anesthesia Plan Comments:         Anesthesia Quick Evaluation

## 2013-02-26 NOTE — Op Note (Signed)
Preoperative diagnosis: BPH, 12 mm bladder stone  Postoperative diagnosis: Same   Procedure: TURP with gyrus loop, cystolitholapaxy a 12 mm bladder stone    Surgeon: Lillette Boxer. Sherle Mello, M.D.   Anesthesia: Gen.   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 He presents at this time for TURP and cystolitholapaxy. I discussed the procedure with the patient in depth and more than one occasion. Risks and complications have been discussed. He understands these and desires to proceed.    Technique and findings: The patient was properly identified in the holding area. He received preoperative IV Cipro. He was taken to the operating room where general anesthetic was administered with the LMA. He was placed in the dorsolithotomy position. Genitalia and perineum were prepped and draped. Timeout was then performed.  A 22 French panendoscope was passed through the urethra under direct vision. Urethra was normal with the exception of prostatic hypertrophy with obstruction. There was no significant median lobe. The bladder was inspected circumferentially. There were no tumors. There were moderate trabeculations scattered throughout. No urothelial lesions were noted. Both ureteral orifices were normal in configuration and location. A bladder stone was lying posteriorly to the right side of the bladder. I then used to [place] to fragment the stone in multiple small fragments. I then removed the cystoscope and placed a 26 French resectoscope sheath using the obturator. I then irrigated all the fragments from the bladder and saved these. The resectoscope element and cutting loop were then placed. The prostate was then resected, first the right lobe, then the left. The verumontanum was left intact for landmark purposes.  Prostatic tissue was resected down to the surgical capsule. Anterior tissue and and apical tissue as well as posterior tissue was then resected, again down to the surgical capsule. At this point, small bleeders were carefully controlled with electrocautery. Chips were irrigated from the bladder and sent for permanent pathology. The prostatic fossa was again inspected, and once found to be hemostatic, the scope was removed. A 22 French three-way Foley catheter was placed with the balloon filled with 40 cc of water. This was hooked to overhead irrigation with saline.  The patient tolerated procedure well. He was awakened and taken to PACU in stable condition.

## 2013-02-26 NOTE — Transfer of Care (Signed)
Immediate Anesthesia Transfer of Care Note  Patient: William [name]  Procedure(s) Performed: Procedure(s) (LRB): CYSTOSCOPY WITH LITHOLAPAXY (N/A) TRANSURETHRAL RESECTION OF THE PROSTATE WITH GYRUS INSTRUMENTS (N/A)  Patient Location: PACU  Anesthesia Type: General  Level of Consciousness: awake, sedated, patient cooperative and responds to stimulation  Airway & Oxygen Therapy: Patient Spontanous Breathing and Patient connected to face mask oxygen  Post-op Assessment: Report given to PACU RN, Post -op Vital signs reviewed and stable and Patient moving all extremities  Post vital signs: Reviewed and stable  Complications: No apparent anesthesia complications

## 2013-02-26 NOTE — Progress Notes (Signed)
Pt. Took home med.  vit. D, calcium,and  flax seed

## 2013-02-26 NOTE — H&P (Signed)
Urology History and Physical Exam  CC: Large prostate, bladder stone  HPI: 72 year old male presents for TUR-P and cystolithalopaxy for primary treatment of an enlarged prostate as well as a large bladder stone. He is on Avodart for symptomatic BPH--he is getting worse despite this med. A recent radiograph reveealed a 12 mm bladder stone. Due to these 2 issues, he presents for operative management.  PMH: Past Medical History  Diagnosis Date  . Hyperlipidemia   . ANXIETY   . Urinary frequency   . BPH (benign prostatic hypertrophy)   . Eczema   . Bladder stone   . History of carotid artery stenosis     S/P BILATERAL CEA  . S/P carotid endarterectomy     BILATERAL ICA--  PATENT PER DUPLEX  05-19-2012  . CVD (cerebrovascular disease)   . Vitamin D deficiency   . 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 GERD (gastroesophageal reflux disease)   . Nocturia     PSH: Past Surgical History  Procedure Laterality Date  . Carotid endarterectomy Bilateral LEFT  11-12-2008  DR GREG HAYES    RIGHT ICA  2006  ([facility])  . Inguinal hernia repair Right 11-08-2006  . Cardiovascular stress test  03-27-2012  DR CRENSHAW    LOW RISK LEXISCAN STUDY-- PROBABLE NORMAL PERFUSION AND SOFT TISSUE ATTENUATION/  NO ISCHEMIA/ EF 51%  . Appendectomy  AS CHILD    Allergies: Allergies  Allergen Reactions  . Adhesive [Tape] Other (See Comments)    BLISTER    Medications: No prescriptions prior to admission     Social History: History   Social History  . Marital Status: Married    Spouse Name: N/A    Number of Children: 2  . Years of Education: N/A   Occupational History  .     Social History Main Topics  . Smoking status: Former Smoker -- 2.00 packs/day for 40 years    Types: Cigarettes    Quit date: 02/15/2005  . Smokeless tobacco: Never Used  . Alcohol Use: 0.0 oz/week     Comment: Occasional  . Drug Use: No  . Sexual Activity: Not on file    Other Topics Concern  . Not on file   Social History Narrative   Exercise--  Walks dogs everyday    Family History: Family History  Problem Relation Age of Onset  . Heart disease Mother     CHF  . Heart disease Father     CHF    Review of Systems: Positive: Slow stream, frequency, urgency, intermittency Negative:   A further 10 point review of systems was negative except what is listed in the HPI.  Physical Exam: @VITALS2 @ General: No acute distress.  Awake. Head:  Normocephalic.  Atraumatic. ENT:  EOMI.  Mucous membranes moist Neck:  Supple.  No lymphadenopathy. CV:  S1 present. S2 present. Regular rate. Pulmonary: Equal effort bilaterally.  Clear to auscultation bilaterally. Abdomen: Soft.  Non tender to palpation. Skin:  Normal turgor.  No visible rash. Extremity: No gross deformity of bilateral upper extremities.  No gross deformity of    bilateral lower extremities. Neurologic: Alert. Appropriate mood.    Studies:  No results found for this basename: HGB, WBC, PLT,  in the last 72 hours  No results found for this basename: NA, K, CL, CO2, BUN, CREATININE, CALCIUM, MAGNESIUM, GFRNONAA, GFRAA,  in the last 72 hours   No results found for this basename: PT,  INR, APTT,  in the last 72 hours   No components found with this basename: ABG,     Assessment:  BPH, 12 mm bladder stone  Plan: TUR-P, cystolithalopaxy

## 2013-02-26 NOTE — Anesthesia Procedure Notes (Signed)
Procedure Name: LMA Insertion Date/Time: 02/26/2013 8:19 AM Performed by: Justice Rocher Pre-anesthesia Checklist: Patient identified, Emergency Drugs available, Suction available and Patient being monitored Patient Re-evaluated:Patient Re-evaluated prior to inductionOxygen Delivery Method: Circle System Utilized Preoxygenation: Pre-oxygenation with 100% oxygen Intubation Type: IV induction Ventilation: Mask ventilation without difficulty LMA: LMA inserted LMA Size: 5.0 Number of attempts: 1 Airway Equipment and Method: bite block Placement Confirmation: positive ETCO2 Tube secured with: Tape Dental Injury: Teeth and Oropharynx as per pre-operative assessment

## 2013-02-27 NOTE — Progress Notes (Signed)
Dr. Risa Grill in to check patient ,answer any questions and discharge patient home for Dr.Dahlstedt.

## 2013-02-28 ENCOUNTER — Encounter (HOSPITAL_BASED_OUTPATIENT_CLINIC_OR_DEPARTMENT_OTHER): Payer: Self-pay | Admitting: Urology

## 2013-04-03 ENCOUNTER — Ambulatory Visit (INDEPENDENT_AMBULATORY_CARE_PROVIDER_SITE_OTHER): Payer: 59

## 2013-04-03 DIAGNOSIS — Z23 Encounter for immunization: Secondary | ICD-10-CM

## 2013-04-25 ENCOUNTER — Other Ambulatory Visit: Payer: Self-pay | Admitting: Family Medicine

## 2013-04-25 NOTE — Telephone Encounter (Signed)
Fenofibrate refilled for one month. OV/labs due

## 2013-04-30 ENCOUNTER — Other Ambulatory Visit: Payer: Self-pay | Admitting: Family Medicine

## 2013-04-30 NOTE — Telephone Encounter (Signed)
Last seen 04/17/12 and filled 01/18/13 #60 with 2 refills. Please advise     KP

## 2013-05-16 ENCOUNTER — Encounter: Payer: Self-pay | Admitting: Family

## 2013-05-17 ENCOUNTER — Telehealth: Payer: Self-pay | Admitting: Family Medicine

## 2013-05-17 ENCOUNTER — Encounter: Payer: Self-pay | Admitting: Family

## 2013-05-17 ENCOUNTER — Encounter: Payer: Self-pay | Admitting: Vascular Surgery

## 2013-05-17 ENCOUNTER — Ambulatory Visit (HOSPITAL_COMMUNITY)
Admission: RE | Admit: 2013-05-17 | Discharge: 2013-05-17 | Disposition: A | Discharge status: Home / Self Care | Payer: 59 | Source: Ambulatory Visit | Attending: Family | Admitting: Family

## 2013-05-17 ENCOUNTER — Other Ambulatory Visit (HOSPITAL_COMMUNITY): Payer: 59

## 2013-05-17 ENCOUNTER — Ambulatory Visit (INDEPENDENT_AMBULATORY_CARE_PROVIDER_SITE_OTHER): Payer: 59 | Admitting: Family

## 2013-05-17 DIAGNOSIS — Z48812 Encounter for surgical aftercare following surgery on the circulatory system: Secondary | ICD-10-CM

## 2013-05-17 DIAGNOSIS — I6529 Occlusion and stenosis of unspecified carotid artery: Secondary | ICD-10-CM | POA: Insufficient documentation

## 2013-05-17 NOTE — Patient Instructions (Addendum)
Stroke Prevention Some medical conditions and behaviors are associated with an increased chance of having a stroke. You may prevent a stroke by making healthy choices and managing medical conditions. Reduce your risk of having a stroke by:  Staying physically active. Get at least 30 minutes of activity on most or all days.  Not smoking. It may also be helpful to avoid exposure to secondhand smoke.  Limiting alcohol use. Moderate alcohol use is considered to be:  No more than 2 drinks per day for men.  No more than 1 drink per day for nonpregnant women.  Eating healthy foods.  Include 5 or more servings of fruits and vegetables a day.  Certain diets may be prescribed to address high blood pressure, high cholesterol, diabetes, or obesity.  Managing your cholesterol levels.  A low-saturated fat, low-trans fat, low-cholesterol, and high-fiber diet may control cholesterol levels.  Take any prescribed medicines to control cholesterol as directed by your caregiver.  Managing your diabetes.  A controlled-carbohydrate, controlled-sugar diet is recommended to manage diabetes.  Take any prescribed medicines to control diabetes as directed by your caregiver.  Controlling your high blood pressure (hypertension).  A low-salt (sodium), low-saturated fat, low-trans fat, and low-cholesterol diet is recommended to manage high blood pressure.  Take any prescribed medicines to control hypertension as directed by your caregiver.  Maintaining a healthy weight.  A reduced-calorie, low-sodium, low-saturated fat, low-trans fat, low-cholesterol diet is recommended to manage weight.  Stopping drug abuse.  Avoiding birth control pills.  Talk to your caregiver about the risks of taking birth control pills if you are over 13 years old, smoke, get migraines, or have ever had a blood clot.  Getting evaluated for sleep disorders (sleep apnea).  Talk to your caregiver about getting a sleep evaluation  if you snore a lot or have excessive sleepiness.  Taking medicines as directed by your caregiver.  For some people, aspirin or blood thinners (anticoagulants) are helpful in reducing the risk of forming abnormal blood clots that can lead to stroke. If you have the irregular heart rhythm of atrial fibrillation, you should be on a blood thinner unless there is a good reason you cannot take them.  Understand all your medicine instructions. SEEK IMMEDIATE MEDICAL CARE IF:   You have sudden weakness or numbness of the face, arm, or leg, especially on one side of the body.  You have sudden confusion.  You have trouble speaking (aphasia) or understanding.  You have sudden trouble seeing in one or both eyes.  You have sudden trouble walking.  You have dizziness.  You have a loss of balance or coordination.  You have a sudden, severe headache with no known cause.  You have new chest pain or an irregular heartbeat. Any of these symptoms may represent a serious problem that is an emergency. Do not wait to see if the symptoms will go away. Get medical help right away. Call your local emergency services (911 in [place] Do not drive yourself to the hospital. Document Released: 07/08/2004 Document Revised: 08/23/2011 Document Reviewed: 12/01/2012 [facility] Patient Information 2014 [employer].   Can try stevia, no calorie sweetener.

## 2013-05-17 NOTE — Addendum Note (Signed)
Addended by: Dorthula Rue L on: 05/17/2013 04:53 PM   Modules accepted: Orders

## 2013-05-17 NOTE — Telephone Encounter (Signed)
Per pt's lab results on 11/10/12 he was to "Repeat labs in 3 months----lipid, hep, bmp, hgba1c 790.6 272.6." He wants to come in Monday, 05/21/2013 for labs. Needs orders. Please advise.

## 2013-05-17 NOTE — Telephone Encounter (Signed)
Patient needs an appointment he has not been seen in a year.     KP

## 2013-05-17 NOTE — Progress Notes (Signed)
Established Carotid Patient  History of Present Illness  William [name] is a 72 y.o. male patient of Dr. Oneida Alar status post right CEA in 2006, left CEA in 2010, returns today for follow up.    Patient has Positive history of stroke in 2006 symptom as manifested by trouble getting out of bed during the night, left arm and leg weakness with slurred speech, vision was affected but wife and patient do not remember which eye, also had left facial drooping.   The patient's previous neurologic deficits are Improved, he still has left arm and leg weakness, left facial drooping.   Reports New Medical or Surgical History: TURP and bladder stone removed.  Pt Diabetic: No Pt smoker: former smoker, quit 2006  Pt meds include: Statin : Yes ASA: Yes Other anticoagulants/antiplatelets: no   Past Medical History  Diagnosis Date  . Hyperlipidemia   . ANXIETY   . Urinary frequency   . BPH (benign prostatic hypertrophy)   . Eczema   . Bladder stone   . History of carotid artery stenosis     S/P BILATERAL CEA  . S/P carotid endarterectomy     BILATERAL ICA--  PATENT PER DUPLEX  05-19-2012  . CVD (cerebrovascular disease)   . Vitamin D deficiency   . 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 GERD (gastroesophageal reflux disease)   . Nocturia   . Stroke 1996    left side weakness  . Carotid artery occlusion     Social History History  Substance Use Topics  . Smoking status: Former Smoker -- 2.00 packs/day for 40 years    Types: Cigarettes    Quit date: 02/15/2005  . Smokeless tobacco: Never Used  . Alcohol Use: 0.0 oz/week     Comment: Occasional    Family History Family History  Problem Relation Age of Onset  . Heart disease Mother     CHF  . Heart disease Father     CHF    Surgical History Past Surgical History  Procedure Laterality Date  . Carotid endarterectomy Bilateral LEFT  11-12-2008  DR GREG HAYES    RIGHT ICA  2006   ([facility])  . Inguinal hernia repair Right 11-08-2006  . Cardiovascular stress test  03-27-2012  DR CRENSHAW    LOW RISK LEXISCAN STUDY-- PROBABLE NORMAL PERFUSION AND SOFT TISSUE ATTENUATION/  NO ISCHEMIA/ EF 51%  . Appendectomy  AS CHILD  . Cystoscopy with litholapaxy N/A 02/26/2013    Procedure: CYSTOSCOPY WITH LITHOLAPAXY;  Surgeon: Franchot Gallo, MD;  Location[facility];  Service: Urology;  Laterality: N/A;  . Transurethral resection of prostate N/A 02/26/2013    Procedure: TRANSURETHRAL RESECTION OF THE PROSTATE WITH GYRUS INSTRUMENTS;  Surgeon: Franchot Gallo, MD;  Location: [facility];  Service: Urology;  Laterality: N/A;    Allergies  Allergen Reactions  . Adhesive [Tape] Other (See Comments)    BLISTER    Current Outpatient Prescriptions  Medication Sig Dispense Refill  . ALPRAZolam (XANAX) 0.25 MG tablet TAKE 1 TABLET BY MOUTH TWICE DAILY AS NEEDED  60 tablet  2  . ARIPiprazole (ABILIFY) 5 MG tablet Take 1 tablet (5 mg total) by mouth daily.  30 tablet  5  . aspirin 325 MG EC tablet Take 325 mg by mouth daily.      . B Complex Vitamins (VITAMIN B COMPLEX PO) Take by mouth daily.        . Calcium Carb-Cholecalciferol (  CALCIUM 1000 + D PO) Take by mouth daily.        . Cholecalciferol (VITAMIN D3) 1000 UNITS CAPS Take 1 capsule by mouth daily.       . CRESTOR 40 MG tablet 1 tab by mouth daily--Office visit due now  90 tablet  0  . escitalopram (LEXAPRO) 20 MG tablet TAKE 1 TABLET BY MOUTH DAILY.  90 tablet  1  . esomeprazole (NEXIUM) 40 MG capsule Take 40 mg by mouth as needed.      . fenofibrate 160 MG tablet TAKE 1 TAB BY MOUTH DAILY---REPEAT LABS ARE DUE NOW  30 tablet  0  . Flaxseed, Linseed, (FLAX SEED OIL PO) Take by mouth daily.        . ciprofloxacin (CIPRO) 250 MG tablet Take 1 tablet (250 mg total) by mouth 2 (two) times daily.  10 tablet  0  . fenofibrate 160 MG tablet Take 160 mg by mouth daily. 1 tab by mouth daily---repeat  labs are due now       No current facility-administered medications for this visit.    Review of Systems : [x]  Positive   [ ]  Denies  General:[ ]  Weight loss,  [ ]  Weight gain, [ ]  Loss of appetite, [ ]  Fever, [ ]  chills  Neurologic: [ ]  Dizziness, [ ]  Blackouts, [ ]  Headaches, [ ]  Seizure [ ]  Stroke, [ ]  "Mini stroke", [ ]  Slurred speech, [ ]  Temporary blindness;  [ ] weakness,  Ear/Nose/Throat: [ ]  Change in hearing, [ ]  Nose bleeds, [ ]  Hoarseness  Vascular:[ ]  Pain in legs with walking, [ ]  Pain in feet while lying flat , [ ]   Non-healing ulcer, [ ]  Blood clot in vein,    Pulmonary: [ ]  Home oxygen, [ ]   Productive cough, [ ]  Bronchitis, [ ]  Coughing up blood,  [ ]  Asthma, [ ]  Wheezing  Musculoskeletal:  [ ]  Arthritis, [ ]  Joint pain, [ ]  low back pain  Cardiac: [ ]  Chest pain, [ ]  Shortness of breath when lying flat, [ ]  Shortness of breath with exertion, [ ]  Palpitations, [ ]  Heart murmur, [ ]   Atrial fibrillation  Hematologic:[ ]  Easy Bruising, [ ]  Anemia; [ ]  Hepatitis  Psychiatric: [ ]   Depression, [ ]  Anxiety   Gastrointestinal: [ ]  Black stool, [ ]  Blood in stool, [ ]  Peptic ulcer disease,  [ ]  Gastroesophageal Reflux, [ ]  Trouble swallowing, [ ]  Diarrhea, [ ]  Constipation  Urinary: [ ]  chronic Kidney disease, [ ]  on HD, [ ]  Burning with urination, [ ]  Frequent urination, [ ]  Difficulty urinating;   Skin: [ ]  Rashes, [ ]  Wounds    Physical Examination  Filed Vitals:   05/17/13 1114  BP: 126/75  Pulse: 60  Resp: 16   Filed Weights   05/17/13 1114  Weight: 231 lb (104.781 kg)   Body mass index is 32.23 kg/(m^2).   General: WDWN obese male in NAD GAIT: normal Eyes: PERRLA Pulmonary:  CTAB, Negative  Rales, Negative rhonchi, & Negative wheezing.  Cardiac: regular Rhythm ,  Negative Murmurs.  VASCULAR EXAM Carotid Bruits Left Right   Negative Negative    Aorta is not palpable. Radial pulses: 2+ right, 1+ left.  LE Pulses LEFT RIGHT       POPLITEAL   palpable    palpable       POSTERIOR TIBIAL   palpable    palpable        DORSALIS PEDIS      ANTERIOR TIBIAL  palpable   palpable     Gastrointestinal: soft, nontender, BS WNL, no r/g,  negative masses.  Musculoskeletal: Negative muscle atrophy/wasting. M/S 5/5 on right UE and LE, 4/5 on left UE and LE, Extremities without ischemic changes.  Neurologic: A&O X 3; Appropriate Affect ; SENSATION ;normal;  Speech is normal CN 2-12 intact except right facial droop with smile only, Pain and light touch intact in extremities, Motor exam as listed above.   Non-Invasive Vascular Imaging CAROTID DUPLEX 05/17/2013   Right ICA: 1 - 39% stenosis. Left ICA: patent.  Previous carotid studies demonstrated: no bilateral ICA stenosis.  These findings are Worse from previous exam in the right ICA.  Assessment: DONTRAVIOUS DONIA is a 72 y.o. male who presents with asymptomatic 1- 39 % Right ICA stenosis post CEA, patent left ICA post CEA. The right ICA stenosis is  Worse from previous exam.  Plan: Follow-up in 1 year with Carotid Duplex scan.   I discussed in depth with the patient the nature of atherosclerosis, and emphasized the importance of maximal medical management including strict control of blood pressure, blood glucose, and lipid levels, obtaining regular exercise, and continued cessation of smoking.  The patient is aware that without maximal medical management the underlying atherosclerotic disease process will progress, limiting the benefit of any interventions.  The patient was given information about stroke prevention and what symptoms should prompt the patient to seek immediate medical care.  Thank you for allowing [place] to participate in this patient's care.  Clemon Chambers, RN, MSN, FNP-C [facility] Office: [phone]  Clinic  Physician: Oneida Alar  05/17/2013 11:28 AM

## 2013-05-18 ENCOUNTER — Encounter: Payer: Self-pay | Admitting: Vascular Surgery

## 2013-05-21 ENCOUNTER — Other Ambulatory Visit: Payer: Self-pay | Admitting: Family Medicine

## 2013-05-21 ENCOUNTER — Other Ambulatory Visit (INDEPENDENT_AMBULATORY_CARE_PROVIDER_SITE_OTHER): Payer: 59

## 2013-05-21 DIAGNOSIS — R7989 Other specified abnormal findings of blood chemistry: Secondary | ICD-10-CM

## 2013-05-21 DIAGNOSIS — E881 Lipodystrophy, not elsewhere classified: Secondary | ICD-10-CM

## 2013-05-21 LAB — HEPATIC FUNCTION PANEL
AST: 29 U/L (ref 0–37)
Albumin: 4 g/dL (ref 3.5–5.2)
Alkaline Phosphatase: 54 U/L (ref 39–117)
Total Protein: 7.9 g/dL (ref 6.0–8.3)

## 2013-05-21 LAB — LIPID PANEL
Cholesterol: 129 mg/dL (ref 0–200)
HDL: 47.2 mg/dL (ref >39.00)
LDL Cholesterol: 57 mg/dL (ref 0–99)
Triglycerides: 125 mg/dL (ref 0.0–149.0)

## 2013-05-21 LAB — BASIC METABOLIC PANEL
Calcium: 9.4 mg/dL (ref 8.4–10.5)
Creatinine, Ser: 1.8 mg/dL — ABNORMAL HIGH (ref 0.4–1.5)

## 2013-05-21 LAB — HEMOGLOBIN A1C: Hgb A1c MFr Bld: 6 % (ref 4.6–6.5)

## 2013-05-21 NOTE — Addendum Note (Signed)
Addended by: Harl Bowie on: 05/21/2013 08:23 AM   Modules accepted: Orders

## 2013-05-22 ENCOUNTER — Other Ambulatory Visit: Payer: Self-pay

## 2013-05-22 DIAGNOSIS — R944 Abnormal results of kidney function studies: Secondary | ICD-10-CM

## 2013-05-28 ENCOUNTER — Other Ambulatory Visit: Payer: Self-pay | Admitting: Family Medicine

## 2013-05-28 ENCOUNTER — Telehealth: Payer: Self-pay | Admitting: Family Medicine

## 2013-05-28 NOTE — Telephone Encounter (Signed)
It not on his med list anymore

## 2013-05-28 NOTE — Telephone Encounter (Signed)
Pam from [employer] called and stated that we referred patient to them and per dr Pearson Grippe he would like for William [name] to stop taking mobic at this time. Also on there scale range he is a level 4.

## 2013-05-30 ENCOUNTER — Encounter: Payer: Self-pay | Admitting: Family Medicine

## 2013-06-22 ENCOUNTER — Telehealth: Payer: Self-pay

## 2013-06-22 NOTE — Telephone Encounter (Signed)
Left message for call back Non identifiable  Flu Vaccine--03/2013 Tdap--10/2011 PNA--05/2009 Shingles--05/2009 CCS--01/2010--Dr Hung--next due 2016 PSA--10/2012--6.21--sees urology

## 2013-06-25 ENCOUNTER — Ambulatory Visit (INDEPENDENT_AMBULATORY_CARE_PROVIDER_SITE_OTHER): Payer: 59 | Admitting: Family Medicine

## 2013-06-25 ENCOUNTER — Other Ambulatory Visit: Payer: Self-pay | Admitting: Family Medicine

## 2013-06-25 ENCOUNTER — Encounter: Payer: Self-pay | Admitting: Family Medicine

## 2013-06-25 VITALS — BP 142/78 | HR 62 | Temp 98.0°F | Ht 69.0 in | Wt 232.0 lb

## 2013-06-25 DIAGNOSIS — I639 Cerebral infarction, unspecified: Secondary | ICD-10-CM

## 2013-06-25 DIAGNOSIS — E669 Obesity, unspecified: Secondary | ICD-10-CM | POA: Insufficient documentation

## 2013-06-25 DIAGNOSIS — I6529 Occlusion and stenosis of unspecified carotid artery: Secondary | ICD-10-CM

## 2013-06-25 DIAGNOSIS — N289 Disorder of kidney and ureter, unspecified: Secondary | ICD-10-CM

## 2013-06-25 DIAGNOSIS — E785 Hyperlipidemia, unspecified: Secondary | ICD-10-CM

## 2013-06-25 DIAGNOSIS — Z23 Encounter for immunization: Secondary | ICD-10-CM

## 2013-06-25 DIAGNOSIS — Z0000 Encounter for general adult medical examination without abnormal findings: Secondary | ICD-10-CM

## 2013-06-25 DIAGNOSIS — R7989 Other specified abnormal findings of blood chemistry: Secondary | ICD-10-CM

## 2013-06-25 DIAGNOSIS — I6350 Cerebral infarction due to unspecified occlusion or stenosis of unspecified cerebral artery: Secondary | ICD-10-CM

## 2013-06-25 DIAGNOSIS — Z8679 Personal history of other diseases of the circulatory system: Secondary | ICD-10-CM

## 2013-06-25 DIAGNOSIS — R799 Abnormal finding of blood chemistry, unspecified: Secondary | ICD-10-CM

## 2013-06-25 DIAGNOSIS — R29898 Other symptoms and signs involving the musculoskeletal system: Secondary | ICD-10-CM | POA: Insufficient documentation

## 2013-06-25 DIAGNOSIS — F411 Generalized anxiety disorder: Secondary | ICD-10-CM

## 2013-06-25 HISTORY — DX: Disorder of kidney and ureter, unspecified: N28.9

## 2013-06-25 LAB — BASIC METABOLIC PANEL
BUN: 21 mg/dL (ref 6–23)
CHLORIDE: 108 meq/L (ref 96–112)
CO2: 26 meq/L (ref 19–32)
CREATININE: 1.7 mg/dL — AB (ref 0.4–1.5)
Calcium: 9.3 mg/dL (ref 8.4–10.5)
GFR: 42.45 mL/min — ABNORMAL LOW (ref >60.00)
GLUCOSE: 87 mg/dL (ref 70–99)
POTASSIUM: 4.1 meq/L (ref 3.5–5.1)
Sodium: 142 mEq/L (ref 135–145)

## 2013-06-25 NOTE — Assessment & Plan Note (Signed)
Refer to nephrology 

## 2013-06-25 NOTE — Assessment & Plan Note (Signed)
Check labs 

## 2013-06-25 NOTE — Progress Notes (Signed)
Pre visit review using our clinic review tool, if applicable. No additional management support is needed unless otherwise documented below in the visit note. 

## 2013-06-25 NOTE — Patient Instructions (Signed)
Preventive Care for Adults, Male A healthy lifestyle and preventive care can promote health and wellness. Preventive health guidelines for men include the following key practices:  A routine yearly physical is a good way to check with your health care provider about your health and preventative screening. It is a chance to share any concerns and updates on your health and to receive a thorough exam.  Visit your dentist for a routine exam and preventative care every 6 months. Brush your teeth twice a day and floss once a day. Good oral hygiene prevents tooth decay and gum disease.  The frequency of eye exams is based on your age, health, family medical history, use of contact lenses, and other factors. Follow your health care provider's recommendations for frequency of eye exams.  Eat a healthy diet. Foods such as vegetables, fruits, whole grains, low-fat dairy products, and lean protein foods contain the nutrients you need without too many calories. Decrease your intake of foods high in solid fats, added sugars, and salt. Eat the right amount of calories for you.Get information about a proper diet from your health care provider, if necessary.  Regular physical exercise is one of the most important things you can do for your health. Most adults should get at least 150 minutes of moderate-intensity exercise (any activity that increases your heart rate and causes you to sweat) each week. In addition, most adults need muscle-strengthening exercises on 2 or more days a week.  Maintain a healthy weight. The body mass index (BMI) is a screening tool to identify possible weight problems. It provides an estimate of body fat based on height and weight. Your health care provider can find your BMI and can help you achieve or maintain a healthy weight.For adults 20 years and older:  A BMI below 18.5 is considered underweight.  A BMI of 18.5 to 24.9 is normal.  A BMI of 25 to 29.9 is considered  overweight.  A BMI of 30 and above is considered obese.  Maintain normal blood lipids and cholesterol levels by exercising and minimizing your intake of saturated fat. Eat a balanced diet with plenty of fruit and vegetables. Blood tests for lipids and cholesterol should begin at age 42 and be repeated every 5 years. If your lipid or cholesterol levels are high, you are over 50, or you are at high risk for heart disease, you may need your cholesterol levels checked more frequently.Ongoing high lipid and cholesterol levels should be treated with medicines if diet and exercise are not working.  If you smoke, find out from your health care provider how to quit. If you do not use tobacco, do not start.  Lung cancer screening is recommended for adults aged 24 80 years who are at high risk for developing lung cancer because of a history of smoking. A yearly low-dose CT scan of the lungs is recommended for people who have at least a 30-pack-year history of smoking and are a current smoker or have quit within the past 15 years. A pack year of smoking is smoking an average of 1 pack of cigarettes a day for 1 year (for example: 1 pack a day for 30 years or 2 packs a day for 15 years). Yearly screening should continue until the smoker has stopped smoking for at least 15 years. Yearly screening should be stopped for people who develop a health problem that would prevent them from having lung cancer treatment.  If you choose to drink alcohol, do not have  more than 2 drinks per day. One drink is considered to be 12 ounces (355 mL) of beer, 5 ounces (148 mL) of wine, or 1.5 ounces (44 mL) of liquor.  Avoid use of street drugs. Do not share needles with anyone. Ask for help if you need support or instructions about stopping the use of drugs.  High blood pressure causes heart disease and increases the risk of stroke. Your blood pressure should be checked at least every 1 2 years. Ongoing high blood pressure should be  treated with medicines, if weight loss and exercise are not effective.  If you are 75 71 years old, ask your health care provider if you should take aspirin to prevent heart disease.  Diabetes screening involves taking a blood sample to check your fasting blood sugar level. This should be done once every 3 years, after age 19, if you are within normal weight and without risk factors for diabetes. Testing should be considered at a younger age or be carried out more frequently if you are overweight and have at least 1 risk factor for diabetes.  Colorectal cancer can be detected and often prevented. Most routine colorectal cancer screening begins at the age of 47 and continues through age 80. However, your health care provider may recommend screening at an earlier age if you have risk factors for colon cancer. On a yearly basis, your health care provider may provide home test kits to check for hidden blood in the stool. Use of a small camera at the end of a tube to directly examine the colon (sigmoidoscopy or colonoscopy) can detect the earliest forms of colorectal cancer. Talk to your health care provider about this at age 66, when routine screening begins. Direct exam of the colon should be repeated every 5 10 years through age 19, unless early forms of precancerous polyps or small growths are found.  People who are at an increased risk for hepatitis B should be screened for this virus. You are considered at high risk for hepatitis B if:  You were born in a country where hepatitis B occurs often. Talk with your health care provider about which countries are considered high-risk.  Your parents were born in a high-risk country and you have not received a shot to protect against hepatitis B (hepatitis B vaccine).  You have HIV or AIDS.  You use needles to inject street drugs.  You live with, or have sex with, someone who has hepatitis B.  You are a man who has sex with other men (MSM).  You get  hemodialysis treatment.  You take certain medicines for conditions such as cancer, organ transplantation, and autoimmune conditions.  Hepatitis C blood testing is recommended for all people born from [birth year] through 1965 and any individual with known risks for hepatitis C.  Practice safe sex. Use condoms and avoid high-risk sexual practices to reduce the spread of sexually transmitted infections (STIs). STIs include gonorrhea, chlamydia, syphilis, trichomonas, herpes, HPV, and human immunodeficiency virus (HIV). Herpes, HIV, and HPV are viral illnesses that have no cure. They can result in disability, cancer, and death.  A one-time screening for abdominal aortic aneurysm (AAA) and surgical repair of large AAAs by ultrasound are recommended for men ages 94 to 74 years who are current or former smokers.  Healthy men should no longer receive prostate-specific antigen (PSA) blood tests as part of routine cancer screening. Talk with your health care provider about prostate cancer screening.  Testicular cancer screening is not recommended  for adult males who have no symptoms. Screening includes self-exam, a health care provider exam, and other screening tests. Consult with your health care provider about any symptoms you have or any concerns you have about testicular cancer.  Use sunscreen. Apply sunscreen liberally and repeatedly throughout the day. You should seek shade when your shadow is shorter than you. Protect yourself by wearing long sleeves, pants, a wide-brimmed hat, and sunglasses year round, whenever you are outdoors.  Once a month, do a whole-body skin exam, using a mirror to look at the skin on your back. Tell your health care provider about new moles, moles that have irregular borders, moles that are larger than a pencil eraser, or moles that have changed in shape or color.  Stay current with required vaccines (immunizations).  Influenza vaccine. All adults should be immunized every  year.  Tetanus, diphtheria, and acellular pertussis (Td, Tdap) vaccine. An adult who has not previously received Tdap or who does not know his vaccine status should receive 1 dose of Tdap. This initial dose should be followed by tetanus and diphtheria toxoids (Td) booster doses every 10 years. Adults with an unknown or incomplete history of completing a 3-dose immunization series with Td-containing vaccines should begin or complete a primary immunization series including a Tdap dose. Adults should receive a Td booster every 10 years.  Varicella vaccine. An adult without evidence of immunity to varicella should receive 2 doses or a second dose if he has previously received 1 dose.  Human papillomavirus (HPV) vaccine. Males aged 44 21 years who have not received the vaccine previously should receive the 3-dose series. Males aged 43 26 years may be immunized. Immunization is recommended through the age of 50 years for any male who has sex with males and did not get any or all doses earlier. Immunization is recommended for any person with an immunocompromised condition through the age of 23 years if he did not get any or all doses earlier. During the 3-dose series, the second dose should be obtained 4 8 weeks after the first dose. The third dose should be obtained 24 weeks after the first dose and 16 weeks after the second dose.  Zoster vaccine. One dose is recommended for adults aged 96 years or older unless certain conditions are present.  Measles, mumps, and rubella (MMR) vaccine. Adults born before 55 generally are considered immune to measles and mumps. Adults born in [birth year] or later should have 1 or more doses of MMR vaccine unless there is a contraindication to the vaccine or there is laboratory evidence of immunity to each of the three diseases. A routine second dose of MMR vaccine should be obtained at least 28 days after the first dose for students attending postsecondary schools, health care  workers, or international travelers. People who received inactivated measles vaccine or an unknown type of measles vaccine during 1963 1967 should receive 2 doses of MMR vaccine. People who received inactivated mumps vaccine or an unknown type of mumps vaccine before 1979 and are at high risk for mumps infection should consider immunization with 2 doses of MMR vaccine. Unvaccinated health care workers born before [birth year] who lack laboratory evidence of measles, mumps, or rubella immunity or laboratory confirmation of disease should consider measles and mumps immunization with 2 doses of MMR vaccine or rubella immunization with 1 dose of MMR vaccine.  Pneumococcal 13-valent conjugate (PCV13) vaccine. When indicated, a person who is uncertain of his immunization history and has no record of immunization  should receive the PCV13 vaccine. An adult aged 67 years or older who has certain medical conditions and has not been previously immunized should receive 1 dose of PCV13 vaccine. This PCV13 should be followed with a dose of pneumococcal polysaccharide (PPSV23) vaccine. The PPSV23 vaccine dose should be obtained at least 8 weeks after the dose of PCV13 vaccine. An adult aged 79 years or older who has certain medical conditions and previously received 1 or more doses of PPSV23 vaccine should receive 1 dose of PCV13. The PCV13 vaccine dose should be obtained 1 or more years after the last PPSV23 vaccine dose.  Pneumococcal polysaccharide (PPSV23) vaccine. When PCV13 is also indicated, PCV13 should be obtained first. All adults aged 74 years and older should be immunized. An adult younger than age 50 years who has certain medical conditions should be immunized. Any person who resides in a nursing home or long-term care facility should be immunized. An adult smoker should be immunized. People with an immunocompromised condition and certain other conditions should receive both PCV13 and PPSV23 vaccines. People with human  immunodeficiency virus (HIV) infection should be immunized as soon as possible after diagnosis. Immunization during chemotherapy or radiation therapy should be avoided. Routine use of PPSV23 vaccine is not recommended for American Indians, [place] Natives, or people younger than 65 years unless there are medical conditions that require PPSV23 vaccine. When indicated, people who have unknown immunization and have no record of immunization should receive PPSV23 vaccine. One-time revaccination 5 years after the first dose of PPSV23 is recommended for people aged 41 64 years who have chronic kidney failure, nephrotic syndrome, asplenia, or immunocompromised conditions. People who received 1 2 doses of PPSV23 before age 15 years should receive another dose of PPSV23 vaccine at age 48 years or later if at least 5 years have passed since the previous dose. Doses of PPSV23 are not needed for people immunized with PPSV23 at or after age 69 years.  Meningococcal vaccine. Adults with asplenia or persistent complement component deficiencies should receive 2 doses of quadrivalent meningococcal conjugate (MenACWY-D) vaccine. The doses should be obtained at least 2 months apart. Microbiologists working with certain meningococcal bacteria, [place] recruits, people at risk during an outbreak, and people who travel to or live in countries with a high rate of meningitis should be immunized. A first-year college student up through age 7 years who is living in a residence hall should receive a dose if he did not receive a dose on or after his 16th birthday. Adults who have certain high-risk conditions should receive one or more doses of vaccine.  Hepatitis A vaccine. Adults who wish to be protected from this disease, have certain high-risk conditions, work with hepatitis A-infected animals, work in hepatitis A research labs, or travel to or work in countries with a high rate of hepatitis A should be immunized. Adults who were  previously unvaccinated and who anticipate close contact with an international adoptee during the first 60 days after arrival in the [place] from a country with a high rate of hepatitis A should be immunized.  Hepatitis B vaccine. Adults who wish to be protected from this disease, have certain high-risk conditions, may be exposed to blood or other infectious body fluids, are household contacts or sex partners of hepatitis B positive people, are clients or workers in certain care facilities, or travel to or work in countries with a high rate of hepatitis B should be immunized.  Haemophilus influenzae type b (Hib) vaccine. A  previously unvaccinated person with asplenia or sickle cell disease or having a scheduled splenectomy should receive 1 dose of Hib vaccine. Regardless of previous immunization, a recipient of a hematopoietic stem cell transplant should receive a 3-dose series 6 12 months after his successful transplant. Hib vaccine is not recommended for adults with HIV infection. Preventive Service / Frequency Ages 62 to 3  Blood pressure check.** / Every 1 to 2 years.  Lipid and cholesterol check.** / Every 5 years beginning at age 43.  Hepatitis C blood test.** / For any individual with known risks for hepatitis C.  Skin self-exam. / Monthly.  Influenza vaccine. / Every year.  Tetanus, diphtheria, and acellular pertussis (Tdap, Td) vaccine.** / Consult your health care provider. 1 dose of Td every 10 years.  Varicella vaccine.** / Consult your health care provider.  HPV vaccine. / 3 doses over 6 months, if 48 or younger.  Measles, mumps, rubella (MMR) vaccine.** / You need at least 1 dose of MMR if you were born in 1957 or later. You may also need a second dose.  Pneumococcal 13-valent conjugate (PCV13) vaccine.** / Consult your health care provider.  Pneumococcal polysaccharide (PPSV23) vaccine.** / 1 to 2 doses if you smoke cigarettes or if you have certain  conditions.  Meningococcal vaccine.** / 1 dose if you are age 8 to 70 years and a Market researcher living in a residence hall, or have one of several medical conditions. You may also need additional booster doses.  Hepatitis A vaccine.** / Consult your health care provider.  Hepatitis B vaccine.** / Consult your health care provider.  Haemophilus influenzae type b (Hib) vaccine.** / Consult your health care provider. Ages 48 to 32  Blood pressure check.** / Every 1 to 2 years.  Lipid and cholesterol check.** / Every 5 years beginning at age 38.  Lung cancer screening. / Every year if you are aged 40 80 years and have a 30-pack-year history of smoking and currently smoke or have quit within the past 15 years. Yearly screening is stopped once you have quit smoking for at least 15 years or develop a health problem that would prevent you from having lung cancer treatment.  Fecal occult blood test (FOBT) of stool. / Every year beginning at age 4 and continuing until age 70. You may not have to do this test if you get a colonoscopy every 10 years.  Flexible sigmoidoscopy** or colonoscopy.** / Every 5 years for a flexible sigmoidoscopy or every 10 years for a colonoscopy beginning at age 76 and continuing until age 62.  Hepatitis C blood test.** / For all people born from [birth year] through 1965 and any individual with known risks for hepatitis C.  Skin self-exam. / Monthly.  Influenza vaccine. / Every year.  Tetanus, diphtheria, and acellular pertussis (Tdap/Td) vaccine.** / Consult your health care provider. 1 dose of Td every 10 years.  Varicella vaccine.** / Consult your health care provider.  Zoster vaccine.** / 1 dose for adults aged 60 years or older.  Measles, mumps, rubella (MMR) vaccine.** / You need at least 1 dose of MMR if you were born in 1957 or later. You may also need a second dose.  Pneumococcal 13-valent conjugate (PCV13) vaccine.** / Consult your health care  provider.  Pneumococcal polysaccharide (PPSV23) vaccine.** / 1 to 2 doses if you smoke cigarettes or if you have certain conditions.  Meningococcal vaccine.** / Consult your health care provider.  Hepatitis A vaccine.** / Consult your health care  provider.  Hepatitis B vaccine.** / Consult your health care provider.  Haemophilus influenzae type b (Hib) vaccine.** / Consult your health care provider. Ages 65 and over  Blood pressure check.** / Every 1 to 2 years.  Lipid and cholesterol check.**/ Every 5 years beginning at age 20.  Lung cancer screening. / Every year if you are aged 55 80 years and have a 30-pack-year history of smoking and currently smoke or have quit within the past 15 years. Yearly screening is stopped once you have quit smoking for at least 15 years or develop a health problem that would prevent you from having lung cancer treatment.  Fecal occult blood test (FOBT) of stool. / Every year beginning at age 50 and continuing until age 75. You may not have to do this test if you get a colonoscopy every 10 years.  Flexible sigmoidoscopy** or colonoscopy.** / Every 5 years for a flexible sigmoidoscopy or every 10 years for a colonoscopy beginning at age 50 and continuing until age 75.  Hepatitis C blood test.** / For all people born from 1945 through 1965 and any individual with known risks for hepatitis C.  Abdominal aortic aneurysm (AAA) screening.** / A one-time screening for ages 65 to 75 years who are current or former smokers.  Skin self-exam. / Monthly.  Influenza vaccine. / Every year.  Tetanus, diphtheria, and acellular pertussis (Tdap/Td) vaccine.** / 1 dose of Td every 10 years.  Varicella vaccine.** / Consult your health care provider.  Zoster vaccine.** / 1 dose for adults aged 60 years or older.  Pneumococcal 13-valent conjugate (PCV13) vaccine.** / Consult your health care provider.  Pneumococcal polysaccharide (PPSV23) vaccine.** / 1 dose for all  adults aged 65 years and older.  Meningococcal vaccine.** / Consult your health care provider.  Hepatitis A vaccine.** / Consult your health care provider.  Hepatitis B vaccine.** / Consult your health care provider.  Haemophilus influenzae type b (Hib) vaccine.** / Consult your health care provider. **Family history and personal history of risk and conditions may change your health care provider's recommendations. Document Released: 07/27/2001 Document Revised: 03/21/2013 Document Reviewed: 10/26/2010 [employer] Patient Information 2014 [employer].  

## 2013-06-25 NOTE — Assessment & Plan Note (Signed)
Per vascular  

## 2013-06-25 NOTE — Assessment & Plan Note (Signed)
Pt and wife requesting home health for ot/pt Consider neuro vs neuro psych

## 2013-06-25 NOTE — Assessment & Plan Note (Signed)
Inc weakness L upp ext Refer home health for PT/ OT

## 2013-06-25 NOTE — Assessment & Plan Note (Signed)
con't meds 

## 2013-06-25 NOTE — Progress Notes (Signed)
Subjective:    William [name] is a 73 y.o. male who presents for Annual  preventive examination.   Preventive Screening-Counseling & Management  Tobacco History  Smoking status  . Former Smoker -- 2.00 packs/day for 40 years  . Types: Cigarettes  . Quit date: 02/15/2005  Smokeless tobacco  . Never Used    Problems Prior to Visit 1. Worsening of L arm weakness---difficulty preparing food and dressing himself  Current Problems (verified) Patient Active Problem List   Diagnosis Date Noted  . Aftercare following surgery of the circulatory system, [place] 05/17/2013  . Confusion 04/17/2012  . Sebaceous cyst 03/03/2011  . LUMBAR SPRAIN AND STRAIN 07/31/2010  . URINARY FREQUENCY 09/25/2009  . RIB PAIN, LEFT SIDED 08/29/2009  . DEPRESSION/ANXIETY 08/01/2009  . Acute pharyngitis 06/17/2009  . CAROTID ARTERY STENOSIS, BILATERAL 05/02/2009  . DIZZINESS 05/02/2009  . HEADACHE 05/02/2009  . ECZEMA, ATOPIC 05/31/2008  . UNSPECIFIED VITAMIN D DEFICIENCY 03/01/2008  . FATIGUE 01/26/2008  . ABDOMINAL PAIN, LEFT UPPER QUADRANT 11/22/2007  . BENIGN PROSTATIC HYPERTROPHY, HX OF 08/06/2007  . ANXIETY 12/21/2006  . HOARSENESS 12/21/2006  . FASTING HYPERGLYCEMIA 12/21/2006  . CEREBROVASCULAR ACCIDENT, HX OF 12/21/2006  . HYPERLIPIDEMIA 09/27/2006  . 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 ALPRAZolam (XANAX) 0.25 MG tablet TAKE 1 TABLET BY MOUTH TWICE DAILY AS NEEDED  60 tablet  2  . ARIPiprazole (ABILIFY) 10 MG tablet 1/2 tab by mouth daily--office visit due now  15 tablet  0  . aspirin 325 MG EC tablet Take 325 mg by mouth daily.      . B Complex Vitamins (VITAMIN B COMPLEX PO) Take by mouth daily.        . Calcium Carb-Cholecalciferol (CALCIUM 1000 + D PO) Take by mouth daily.        . Cholecalciferol (VITAMIN D3) 1000 UNITS CAPS  Take 1 capsule by mouth daily.       . CRESTOR 40 MG tablet 1 tab by mouth daily--Office visit due now  90 tablet  0  . escitalopram (LEXAPRO) 20 MG tablet TAKE 1 TABLET BY MOUTH DAILY.  90 tablet  1  . esomeprazole (NEXIUM) 40 MG capsule Take 40 mg by mouth as needed.      . fenofibrate 160 MG tablet TAKE 1 TABLET BY MOUTH DAILY ---REPEAT LABS ARE DUE NOW  30 tablet  0  . Flaxseed, Linseed, (FLAX SEED OIL PO) Take by mouth daily.         No current facility-administered medications on file prior to visit.    Current Medications (verified) Current Outpatient Prescriptions  Medication Sig Dispense Refill  . ALPRAZolam (XANAX) 0.25 MG tablet TAKE 1 TABLET BY MOUTH TWICE DAILY AS NEEDED  60 tablet  2  . ARIPiprazole (ABILIFY) 10 MG tablet 1/2 tab by mouth daily--office visit due now  15 tablet  0  . aspirin 325 MG EC tablet Take 325 mg by mouth daily.      . B Complex Vitamins (VITAMIN B COMPLEX PO) Take by mouth daily.        . Calcium Carb-Cholecalciferol (CALCIUM 1000 + D PO) Take by mouth daily.        . Cholecalciferol (VITAMIN D3) 1000 UNITS CAPS Take 1 capsule by mouth daily.       . CRESTOR 40 MG tablet 1 tab by mouth daily--Office visit  due now  90 tablet  0  . escitalopram (LEXAPRO) 20 MG tablet TAKE 1 TABLET BY MOUTH DAILY.  90 tablet  1  . esomeprazole (NEXIUM) 40 MG capsule Take 40 mg by mouth as needed.      . fenofibrate 160 MG tablet TAKE 1 TABLET BY MOUTH DAILY ---REPEAT LABS ARE DUE NOW  30 tablet  0  . Flaxseed, Linseed, (FLAX SEED OIL PO) Take by mouth daily.         No current facility-administered medications for this visit.     Allergies (verified) Adhesive   PAST HISTORY  Family History Family History  Problem Relation Age of Onset  . Heart disease Mother     CHF  . Heart disease Father     CHF    Social History History  Substance Use Topics  . Smoking status: Former Smoker -- 2.00 packs/day for 40 years    Types: Cigarettes    Quit date: 02/15/2005   . Smokeless tobacco: Never Used  . Alcohol Use: 0.0 oz/week     Comment: Occasional    Are there smokers in your home (other than you)?  No  Risk Factors Current exercise habits: The patient does not participate in regular exercise at present.  Dietary issues discussed: na   Cardiac risk factors: advanced age (older than 15 for men, 91 for women), dyslipidemia, family history of premature cardiovascular disease, hypertension, male gender and sedentary lifestyle.  Depression Screen (Note: if answer to either of the following is "Yes", a more complete depression screening is indicated)   Q1: Over the past two weeks, have you felt down, depressed or hopeless? No  Q2: Over the past two weeks, have you felt little interest or pleasure in doing things? No  Have you lost interest or pleasure in daily life? No  Do you often feel hopeless? No  Do you cry easily over simple problems? No  Activities of Daily Living In your present state of health, do you have any difficulty performing the following activities?:  Driving? Yes Managing money?  Yes Feeding yourself? No Getting from bed to chair? No Climbing a flight of stairs? No Preparing food and eating?: Yes Bathing or showering? Yes Getting dressed: Yes Getting to the toilet? No Using the toilet:No Moving around from place to place: No In the past year have you fallen or had a near fall?:No   Are you sexually active?  Yes  Do you have more than one partner?  No  Hearing Difficulties: No Do you often ask people to speak up or repeat themselves? No Do you experience ringing or noises in your ears? No Do you have difficulty understanding soft or whispered voices? No   Do you feel that you have a problem with memory? No  Do you often misplace items? No  Do you feel safe at home?  Yes  Cognitive Testing  Alert? Yes  Normal Appearance?Yes  Oriented to person? Yes  Place? Yes   Time? Yes  Recall of three objects?  Yes  Can  perform simple calculations? Yes  Displays appropriate judgment?Yes  Can read the correct time from a watch face?Yes   Advanced Directives have been discussed with the patient? Yes   List the Names of Other Physician/Practitioners you currently use: 1.    Indicate any recent Medical Services you may have received from other than [facility] providers in the past year (date may be approximate).  Immunization History  Administered Date(s) Administered  .  Influenza Split 03/23/2011, 03/13/2012  . Influenza Whole 04/20/2007, 03/01/2008, 04/01/2009, 03/27/2010  . Influenza,inj,Quad PF,36+ Mos 04/03/2013  . Pneumococcal Polysaccharide-23 05/30/2009  . Tdap 10/19/2011  . Zoster 05/30/2009    Screening Tests Health Maintenance  Topic Date Due  . Influenza Vaccine  01/12/2014  . Colonoscopy  02/07/2015  . Tetanus/tdap  10/18/2021  . Pneumococcal Polysaccharide Vaccine Age 79 And Over  Completed  . Zostavax  Completed    All answers were reviewed with the patient and necessary referrals were made:  Garnet Koyanagi, DO   06/25/2013   History reviewed:  He  has a past medical history of Hyperlipidemia; ANXIETY; Urinary frequency; BPH (benign prostatic hypertrophy); Eczema; Bladder stone; History of carotid artery stenosis; S/P carotid endarterectomy; CVD (cerebrovascular disease); Vitamin D deficiency; History of CVA (cerebrovascular accident) without residual deficits; GERD (gastroesophageal reflux disease); Nocturia; Stroke (1996); and Carotid artery occlusion. He  does not have any pertinent problems on file. He  has past surgical history that includes Carotid endarterectomy (Bilateral, LEFT  11-12-2008  DR GREG HAYES); Inguinal hernia repair (Right, 11-08-2006); Cardiovascular stress test (03-27-2012  DR CRENSHAW); Appendectomy (AS CHILD); Cystoscopy with litholapaxy (N/A, 02/26/2013); and Transurethral resection of prostate (N/A, 02/26/2013). His family history includes Heart disease in his  father and mother. He  reports that he quit smoking about 8 years ago. His smoking use included Cigarettes. He has a 80 pack-year smoking history. He has never used smokeless tobacco. He reports that he drinks alcohol. He reports that he does not use illicit drugs. He has a current medication list which includes the following prescription(s): alprazolam, aripiprazole, aspirin, b complex vitamins, calcium carb-cholecalciferol, vitamin d3, crestor, escitalopram, esomeprazole, fenofibrate, and flaxseed (linseed). Current Outpatient Prescriptions on File Prior to Visit  Medication Sig Dispense Refill  . ALPRAZolam (XANAX) 0.25 MG tablet TAKE 1 TABLET BY MOUTH TWICE DAILY AS NEEDED  60 tablet  2  . ARIPiprazole (ABILIFY) 10 MG tablet 1/2 tab by mouth daily--office visit due now  15 tablet  0  . aspirin 325 MG EC tablet Take 325 mg by mouth daily.      . B Complex Vitamins (VITAMIN B COMPLEX PO) Take by mouth daily.        . Calcium Carb-Cholecalciferol (CALCIUM 1000 + D PO) Take by mouth daily.        . Cholecalciferol (VITAMIN D3) 1000 UNITS CAPS Take 1 capsule by mouth daily.       . CRESTOR 40 MG tablet 1 tab by mouth daily--Office visit due now  90 tablet  0  . escitalopram (LEXAPRO) 20 MG tablet TAKE 1 TABLET BY MOUTH DAILY.  90 tablet  1  . esomeprazole (NEXIUM) 40 MG capsule Take 40 mg by mouth as needed.      . fenofibrate 160 MG tablet TAKE 1 TABLET BY MOUTH DAILY ---REPEAT LABS ARE DUE NOW  30 tablet  0  . Flaxseed, Linseed, (FLAX SEED OIL PO) Take by mouth daily.         No current facility-administered medications on file prior to visit.   He is allergic to adhesive.  Review of Systems  Review of Systems  Constitutional: Negative for activity change, appetite change and fatigue.  HENT: Negative for hearing loss, congestion, tinnitus and ear discharge.   Eyes: Negative for visual disturbance (see optho q1y --due)  Respiratory: Negative for cough, chest tightness and shortness of  breath.   Cardiovascular: Negative for chest pain, palpitations and leg swelling.  Gastrointestinal: Negative for  abdominal pain, diarrhea, constipation and abdominal distention.  Genitourinary: Negative for urgency, frequency, decreased urine volume and difficulty urinating.  Musculoskeletal: Negative for back pain, arthralgias and gait problem.  Skin: Negative for color change, pallor and rash.  Neurological: Negative for dizziness, light-headedness, numbness and headaches.  Hematological: Negative for adenopathy. Does not bruise/bleed easily.  Psychiatric/Behavioral: Negative for suicidal ideas, confusion, sleep disturbance, self-injury, dysphoric mood, decreased concentration and agitation.  Pt is able to read and write and can do all ADLs No risk for falling No abuse/ violence in home      Objective:     Vision by Snellen chart: right eye:20/70, left eye:20/70 +1 Blood pressure 142/78, pulse 62, temperature 98 F (36.7 C), temperature source Oral, height 5\' 9"  (1.753 m), weight 232 lb (105.235 kg), SpO2 96.00%. Body mass index is 34.24 kg/(m^2).  BP 142/78  Pulse 62  Temp(Src) 98 F (36.7 C) (Oral)  Ht 5\' 9"  (1.753 m)  Wt 232 lb (105.235 kg)  BMI 34.24 kg/m2  SpO2 96% General appearance: alert, cooperative, appears stated age and no distress Head: Normocephalic, without obvious abnormality, atraumatic Eyes: negative findings: lids and lashes normal and pupils equal, round, reactive to light and accomodation Ears: normal TM&#39;s and external ear canals both ears Nose: Nares normal. Septum midline. Mucosa normal. No drainage or sinus tenderness. Throat: lips, mucosa, and tongue normal; teeth and gums normal Neck: no adenopathy, no carotid bruit, no JVD, supple, symmetrical, trachea midline and thyroid not enlarged, symmetric, no tenderness/mass/nodules Back: symmetric, no curvature. ROM normal. No CVA tenderness. Lungs: clear to auscultation bilaterally Chest wall: no  tenderness Heart: regular rate and rhythm, S1, S2 normal, no murmur, click, rub or gallop Abdomen: soft, non-tender; bowel sounds normal; no masses,  no organomegaly Male genitalia: normal, penis: no lesions or discharge. testes: no masses or tenderness. no hernias Rectal: normal tone, normal prostate, no masses or tenderness Extremities: extremities normal, atraumatic, no cyanosis or edema Pulses: 2+ and symmetric Skin: Skin color, texture, turgor normal. No rashes or lesions Lymph nodes: Cervical, supraclavicular, and axillary nodes normal. Neurologic: Mental status: Alert, oriented, thought content appropriate Cranial nerves: normal Motor: 3/5 strength L arm, r arm normal Gait: Normal Psych-- normal-- inc anxiety with driving---pt not driving presently      Assessment:     cpe      Plan:     During the course of the visit the patient was educated and counseled about appropriate screening and preventive services including:    Pneumococcal vaccine   Influenza vaccine  Prostate cancer screening  Colorectal cancer screening  Glaucoma screening  Advanced directives: has NO advanced directive  - add't info requested. Referral to SW: pt and wife will go  Diet review for nutrition referral? Yes ____  Not Indicated __x__   Patient Instructions (the written plan) was given to the patient.  Medicare Attestation I have personally reviewed: The patient's medical and social history Their use of alcohol, tobacco or illicit drugs Their current medications and supplements The patient's functional ability including ADLs,fall risks, home safety risks, cognitive, and hearing and visual impairment Diet and physical activities Evidence for depression or mood disorders  The patient's weight, height, BMI, and visual acuity have been recorded in the chart.  I have made referrals, counseling, and provided education to the patient based on review of the above and I have provided the  patient with a written personalized care plan for preventive services.     Garnet Koyanagi, DO  06/25/2013       

## 2013-06-26 NOTE — Telephone Encounter (Signed)
Unable to reach pre visit.  

## 2013-07-01 ENCOUNTER — Encounter: Payer: Self-pay | Admitting: Family Medicine

## 2013-07-02 ENCOUNTER — Other Ambulatory Visit: Payer: Self-pay | Admitting: Family Medicine

## 2013-07-11 ENCOUNTER — Encounter: Payer: Self-pay | Admitting: Family Medicine

## 2013-07-17 ENCOUNTER — Telehealth: Payer: Self-pay | Admitting: *Deleted

## 2013-07-17 DIAGNOSIS — F329 Major depressive disorder, single episode, unspecified: Secondary | ICD-10-CM

## 2013-07-17 DIAGNOSIS — I259 Chronic ischemic heart disease, unspecified: Secondary | ICD-10-CM

## 2013-07-17 DIAGNOSIS — M6281 Muscle weakness (generalized): Secondary | ICD-10-CM

## 2013-07-17 DIAGNOSIS — F3289 Other specified depressive episodes: Secondary | ICD-10-CM

## 2013-07-17 NOTE — Telephone Encounter (Signed)
07/16/2013 Pt wife dropped off [place] Duty Form to be filled out excusing pt from jury duty. 07/17/2013 Made copy and put in folder at front desk. Per Maudie Mercury, no charge slip needed.  Put in Kim's folder.  bw

## 2013-07-18 ENCOUNTER — Telehealth: Payer: Self-pay | Admitting: *Deleted

## 2013-07-18 NOTE — Telephone Encounter (Signed)
07/12/2013 Received fax from [facility] for [place] and POC. 07/18/2013 Completed forms faxed to [facility] by Maudie Mercury and copy sent to batch.  I attached billing form to billing copy and placed in [place] blue folder.  bw

## 2013-07-24 ENCOUNTER — Other Ambulatory Visit: Payer: Self-pay | Admitting: Family Medicine

## 2013-07-24 NOTE — Telephone Encounter (Signed)
Last seen 06/25/13 and filled 04/30/13 #[id number] with 2 refills. Please advise     KP

## 2013-07-26 NOTE — Telephone Encounter (Signed)
07/26/2013 Copy sent to billing.  bw

## 2013-07-30 ENCOUNTER — Other Ambulatory Visit: Payer: Self-pay | Admitting: Family Medicine

## 2013-08-03 ENCOUNTER — Other Ambulatory Visit (HOSPITAL_COMMUNITY): Payer: Self-pay | Admitting: Nephrology

## 2013-08-03 DIAGNOSIS — N183 Chronic kidney disease, stage 3 unspecified: Secondary | ICD-10-CM

## 2013-08-07 ENCOUNTER — Ambulatory Visit (HOSPITAL_COMMUNITY): Payer: 59

## 2013-08-08 ENCOUNTER — Ambulatory Visit (HOSPITAL_COMMUNITY)
Admission: RE | Admit: 2013-08-08 | Discharge: 2013-08-08 | Disposition: A | Discharge status: Home / Self Care | Payer: 59 | Source: Ambulatory Visit | Attending: Nephrology | Admitting: Nephrology

## 2013-08-08 DIAGNOSIS — N183 Chronic kidney disease, stage 3 unspecified: Secondary | ICD-10-CM

## 2013-08-08 DIAGNOSIS — N281 Cyst of kidney, acquired: Secondary | ICD-10-CM | POA: Insufficient documentation

## 2013-08-08 DIAGNOSIS — N400 Enlarged prostate without lower urinary tract symptoms: Secondary | ICD-10-CM | POA: Insufficient documentation

## 2013-08-08 DIAGNOSIS — N200 Calculus of kidney: Secondary | ICD-10-CM | POA: Insufficient documentation

## 2013-08-13 ENCOUNTER — Other Ambulatory Visit: Payer: Self-pay | Admitting: Family Medicine

## 2013-09-25 ENCOUNTER — Encounter: Payer: Self-pay | Admitting: Family Medicine

## 2013-09-25 ENCOUNTER — Other Ambulatory Visit: Payer: Self-pay | Admitting: Family Medicine

## 2013-10-11 ENCOUNTER — Encounter: Payer: Self-pay | Admitting: Family Medicine

## 2013-10-11 DIAGNOSIS — Z8673 Personal history of transient ischemic attack (TIA), and cerebral infarction without residual deficits: Secondary | ICD-10-CM

## 2013-10-16 ENCOUNTER — Ambulatory Visit (INDEPENDENT_AMBULATORY_CARE_PROVIDER_SITE_OTHER): Payer: 59 | Admitting: Neurology

## 2013-10-16 ENCOUNTER — Encounter: Payer: Self-pay | Admitting: Neurology

## 2013-10-16 VITALS — BP 116/80 | HR 72 | Resp 18 | Ht 71.0 in | Wt 229.0 lb

## 2013-10-16 DIAGNOSIS — R4189 Other symptoms and signs involving cognitive functions and awareness: Secondary | ICD-10-CM

## 2013-10-16 DIAGNOSIS — F09 Unspecified mental disorder due to known physiological condition: Secondary | ICD-10-CM

## 2013-10-16 DIAGNOSIS — Z8673 Personal history of transient ischemic attack (TIA), and cerebral infarction without residual deficits: Secondary | ICD-10-CM

## 2013-10-16 NOTE — Progress Notes (Signed)
NEUROLOGY CONSULTATION NOTE  [name] MRN: [id number] DOB: 01-04-1941  Referring provider: Dr. Etter Sjogren Primary care provider: Dr. Etter Sjogren  Reason for consult:  Confusion  HISTORY OF PRESENT ILLNESS: William [name] is a 73 year old right-handed man with history of stroke, carotid artery disease, hyperlipidemia, and depression who presents for evaluation and management of cerebrovascular disease.  He is accompanied by his wife.  Records and images were personally reviewed where available.    He had a stroke in 2006, after experiencing left facial droop, left arm and leg weakness, as well slurred vision and vision problems.  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  He has some residual left sided weakness and facial weakness.  He underwent left carotid endarterectomy in 2010 for asymptomatic stenosis.  Over the past year and a half, he and his wife have noted a gradual progression and cognitive deficits. Onset correlated during a period when he was experiencing dizziness and syncope secondary to orthostatic hypotension. At that time, he was dehydrated and not drinking much fluids. He was instructed to start increasing his fluid intake as well as his sodium intake. Now, he tries to drink 3 bottles of water per day. He particularly notes confusion regarding how to perform certain everyday tasks. For example, it takes him a long time to unload the dishwasher because he has trouble figuring out where to put the dishes. He use to enjoy cooking and preparing meals. Now he has difficulty cooking and can only see things up in the microwave. He also has been experiencing dressing apraxia. Sometimes he will put both his feet into one leg of his pants. Other times, he will mis-button his shirts. He denies any language dysfunction such as difficulty understanding other people or other people not understanding him. He has no trouble reading or writing. His short-term and  long-term memory are pretty good. He denies any difficulty with face recognition. He denies hallucinations or delusions. He has not had any change in his personality or behavior. He still drives but very seldom and only locally. He has not had any problems with accidents or near accidents, but he says he drives slowly because he is very nervous and cautious. He does have history of anxiety and often shakes when he gets nervous. He denies any family history of dementia. However, there is a psychiatric history with his mother and sister.  He denies history of alcohol abuse or drug use.  05/21/13 LABS:  LDL 57, Hgb A1c 6.0 05/17/13 Carotid doppler: bilateral 1-39% stenosis of the right proximal ICA and no stenosis of the left ICA. 03/11/12 MRI Brain wo contrast:  Remote large right MCA .  PAST MEDICAL HISTORY: Past Medical History  Diagnosis Date  . Hyperlipidemia   . ANXIETY   . Urinary frequency   . BPH (benign prostatic hypertrophy)   . Eczema   . Bladder stone   . History of carotid artery stenosis     S/P BILATERAL CEA  . S/P carotid endarterectomy     BILATERAL ICA--  PATENT PER DUPLEX  05-19-2012  . CVD (cerebrovascular disease)   . Vitamin D deficiency   . 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 GERD (gastroesophageal reflux disease)   . Nocturia   . Stroke 1996    left side weakness  . Carotid artery occlusion     PAST SURGICAL HISTORY: Past Surgical History  Procedure Laterality Date  .  Carotid endarterectomy Bilateral LEFT  11-12-2008  DR GREG HAYES    RIGHT ICA  2006  ([facility])  . Inguinal hernia repair Right 11-08-2006  . Cardiovascular stress test  03-27-2012  DR CRENSHAW    LOW RISK LEXISCAN STUDY-- PROBABLE NORMAL PERFUSION AND SOFT TISSUE ATTENUATION/  NO ISCHEMIA/ EF 51%  . Appendectomy  AS CHILD  . Cystoscopy with litholapaxy N/A 02/26/2013    Procedure: CYSTOSCOPY WITH LITHOLAPAXY;  Surgeon: Franchot Gallo, MD;  Location:  [facility];  Service: Urology;  Laterality: N/A;  . Transurethral resection of prostate N/A 02/26/2013    Procedure: TRANSURETHRAL RESECTION OF THE PROSTATE WITH GYRUS INSTRUMENTS;  Surgeon: Franchot Gallo, MD;  Location: [facility];  Service: Urology;  Laterality: N/A;    MEDICATIONS: Current Outpatient Prescriptions on File Prior to Visit  Medication Sig Dispense Refill  . ABILIFY 10 MG tablet Take 0.5 tablets (5 mg total) by mouth daily.  15 tablet  5  . ALPRAZolam (XANAX) 0.25 MG tablet TAKE 1 TABLET BY MOUTH TWICE DAILY AS NEEDED **DUE FOR OFFICE VISIT**  60 tablet  2  . aspirin 325 MG EC tablet Take 325 mg by mouth daily.      . B Complex Vitamins (VITAMIN B COMPLEX PO) Take by mouth daily.        . Calcium Carb-Cholecalciferol (CALCIUM 1000 + D PO) Take by mouth daily.        . CRESTOR 40 MG tablet Take 1 tablet (40 mg total) by mouth daily.  90 tablet  1  . escitalopram (LEXAPRO) 20 MG tablet TAKE 1 TABLET BY MOUTH DAILY.  90 tablet  1  . esomeprazole (NEXIUM) 40 MG capsule Take 40 mg by mouth as needed.      . fenofibrate 160 MG tablet TAKE 1 TABLET BY MOUTH DAILY  30 tablet  2  . Flaxseed, Linseed, (FLAX SEED OIL PO) Take by mouth daily.         No current facility-administered medications on file prior to visit.    ALLERGIES: Allergies  Allergen Reactions  . Adhesive [Tape] Other (See Comments)    BLISTER    FAMILY HISTORY: Family History  Problem Relation Age of Onset  . Heart disease Mother     CHF  . Heart disease Father     CHF    SOCIAL HISTORY: History   Social History  . Marital Status: Married    Spouse Name: N/A    Number of Children: 2  . Years of Education: N/A   Occupational History  .     Social History Main Topics  . Smoking status: Former Smoker -- 2.00 packs/day for 40 years    Types: Cigarettes    Quit date: 02/15/2005  . Smokeless tobacco: Never Used  . Alcohol Use: 0.0 oz/week     Comment:  Occasional  . Drug Use: No  . Sexual Activity: Not on file   Other Topics Concern  . Not on file   Social History Narrative   Exercise--  Walks dogs everyday    REVIEW OF SYSTEMS: Constitutional: No fevers, chills, or sweats, no generalized fatigue, change in appetite Eyes: No visual changes, double vision, eye pain Ear, nose and throat: No hearing loss, ear pain, nasal congestion, sore throat Cardiovascular: No chest pain, palpitations Respiratory:  No shortness of breath at rest or with exertion, wheezes GastrointestinaI: No nausea, vomiting, diarrhea, abdominal pain, fecal incontinence Genitourinary:  No dysuria, urinary retention or frequency Musculoskeletal:  No neck pain, back pain Integumentary: No rash, pruritus, skin lesions Neurological: as above Psychiatric: No depression, insomnia, anxiety Endocrine: No palpitations, fatigue, diaphoresis, mood swings, change in appetite, change in weight, increased thirst Hematologic/Lymphatic:  No anemia, purpura, petechiae. Allergic/Immunologic: no itchy/runny eyes, nasal congestion, recent allergic reactions, rashes  PHYSICAL EXAM: Filed Vitals:   10/16/13 1340  BP: 116/80  Pulse: 72  Resp: 18   General: No acute distress Head:  Normocephalic/atraumatic Neck: supple, no paraspinal tenderness, full range of motion Back: No paraspinal tenderness Heart: regular rate and rhythm Lungs: Clear to auscultation bilaterally. Vascular: No carotid bruits. Neurological Exam: Mental status: alert and oriented to person, place, and time, recent and remote memory intact, fund of knowledge intact, attention and concentration intact.  Recalled 3 of 5 words after a couple of minutes.  Impaired visuospatial and executive functioning in regards to incorrectly completing the trail making test and copying a cube.  He was able to draw a clock correctly at correct requested time, except the 5 and 6 were placed close to each other so that the 5 was in  the 6 position.  He says that was due to his shaking.  Language intact.  MOCA 24/30.  Speech fluent and not dysarthric. Cranial nerves: CN I: not tested CN II: pupils equal, round and reactive to light, visual fields intact, fundi unremarkable, without vessel changes, exudates, hemorrhages or papilledema. CN III, IV, VI:  full range of motion, no nystagmus, no ptosis CN V: facial sensation intact CN VII: trace left lower facial weakness. CN VIII: hearing intact CN IX, X: gag intact, uvula midline CN XI: sternocleidomastoid and trapezius muscles intact CN XII: tongue midline Bulk & Tone: normal, no fasciculations. Motor: 5 out of 5 throughout. Mildly reduced finger thumb tapping on the left.  Sensation: Temperature and vibration intact  Deep Tendon Reflexes:  3+ in the left upper or lower extremities, 2+ in the right upper and lower extremities, toes downgoing  Finger to nose testing: No dysmetria  Heel to shin: No dysmetria  Gait: Normal station and stride, reduced left arm swing, mild difficulty walking in tandem . Romberg negative.  IMPRESSION: 1.  Cognitive deficits, with visuospatial/executive dysfunction and apraxia.  Symptoms are in the same distribution where he had his stroke, however it is unusual that the onset started several years after the stroke.  One possibility is that he suffered hypoperfusion during the period of time he was orthostatic. Presentation not consistent with more common neurodegenerative conditions such as Alzheimer's, FTD or Lewy Body Dementia. 2.  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  MRI of brain without contrast to look for evidence of new infarct or change compared to prior scans. 2.  Check B12 and TSH. 3.  Refer for formal neuropsychological testing. 4.  For secondary stroke prevention:  Continue antiplatelet therapy.  Continue statin (LDL at goal).  Increase exercise, Mediterranean diet. 5.  Follow up after neuropsych  testing.  60 minutes spent with patient and his wife, over 50% spent reviewing chart, MRI, counseling and coordinating care.  Thank you for allowing me to take part in the care of this patient.  Metta Clines, DO  CC:  Garnet Koyanagi, DO

## 2013-10-16 NOTE — Patient Instructions (Addendum)
1.  I would like to get another MRI of the brain [facility]  10/19/13 at 7:45pm  2.  We will also check thyroid and vitamin B12 levels 3.  We will refer you for neuropsychological testing.   4.  Start regular exercise and Mediterranean diet. 5.  Follow up right after neuropsychological exam.

## 2013-10-17 LAB — VITAMIN B12: Vitamin B-12: 720 pg/mL (ref 211–911)

## 2013-10-17 LAB — TSH: TSH: 1.668 u[IU]/mL (ref 0.350–4.500)

## 2013-10-19 ENCOUNTER — Ambulatory Visit (HOSPITAL_COMMUNITY)
Admission: RE | Admit: 2013-10-19 | Discharge: 2013-10-19 | Disposition: A | Discharge status: Home / Self Care | Payer: 59 | Source: Ambulatory Visit | Attending: Neurology | Admitting: Neurology

## 2013-10-19 DIAGNOSIS — F09 Unspecified mental disorder due to known physiological condition: Secondary | ICD-10-CM | POA: Insufficient documentation

## 2013-10-19 DIAGNOSIS — Z8673 Personal history of transient ischemic attack (TIA), and cerebral infarction without residual deficits: Secondary | ICD-10-CM | POA: Insufficient documentation

## 2013-10-19 DIAGNOSIS — R4189 Other symptoms and signs involving cognitive functions and awareness: Secondary | ICD-10-CM

## 2013-10-21 ENCOUNTER — Encounter: Payer: Self-pay | Admitting: Family Medicine

## 2013-10-24 ENCOUNTER — Other Ambulatory Visit: Payer: Self-pay

## 2013-10-24 MED ORDER — ARIPIPRAZOLE 10 MG PO TABS: 5.0000 mg | ORAL_TABLET | Freq: Every day | ORAL | Status: DC

## 2013-10-24 NOTE — Telephone Encounter (Signed)
Request for the generic of the Abilify. Rx sent to [facility] pharmacy.    KP

## 2013-10-29 ENCOUNTER — Telehealth: Payer: Self-pay | Admitting: *Deleted

## 2013-10-29 ENCOUNTER — Other Ambulatory Visit: Payer: Self-pay | Admitting: Family Medicine

## 2013-10-29 NOTE — Telephone Encounter (Signed)
Patient is aware to normal labs and MRI

## 2013-10-29 NOTE — Telephone Encounter (Signed)
Message copied by Claudie Revering on Mon Oct 29, 2013 10:24 AM ------      Message from: JAFFE, ADAM R      Created: Wed Oct 17, 2013  7:08 AM       Blood work is normal.      ----- Message -----         From: Lab in [place]: 10/17/2013   1:52 AM           To: Dudley Major, DO                   ------

## 2013-10-29 NOTE — Telephone Encounter (Signed)
Last seen 06/25/13 and filled 07/24/13 #60 with 2 refills. Please advise     KP

## 2013-11-09 ENCOUNTER — Encounter: Payer: Self-pay | Admitting: Neurology

## 2013-11-09 ENCOUNTER — Telehealth: Payer: Self-pay | Admitting: Neurology

## 2013-11-09 ENCOUNTER — Ambulatory Visit (INDEPENDENT_AMBULATORY_CARE_PROVIDER_SITE_OTHER): Payer: 59 | Admitting: Neurology

## 2013-11-09 ENCOUNTER — Encounter: Payer: Self-pay | Admitting: Family Medicine

## 2013-11-09 VITALS — BP 138/74 | HR 68 | Temp 98.0°F | Resp 18 | Wt 226.8 lb

## 2013-11-09 DIAGNOSIS — F09 Unspecified mental disorder due to known physiological condition: Secondary | ICD-10-CM

## 2013-11-09 DIAGNOSIS — F32A Depression, unspecified: Secondary | ICD-10-CM

## 2013-11-09 DIAGNOSIS — F3289 Other specified depressive episodes: Secondary | ICD-10-CM

## 2013-11-09 DIAGNOSIS — G2401 Drug induced subacute dyskinesia: Secondary | ICD-10-CM

## 2013-11-09 DIAGNOSIS — R4189 Other symptoms and signs involving cognitive functions and awareness: Secondary | ICD-10-CM

## 2013-11-09 DIAGNOSIS — F329 Major depressive disorder, single episode, unspecified: Secondary | ICD-10-CM

## 2013-11-09 DIAGNOSIS — Z8673 Personal history of transient ischemic attack (TIA), and cerebral infarction without residual deficits: Secondary | ICD-10-CM

## 2013-11-09 MED ORDER — SERTRALINE HCL 25 MG PO TABS: 25.0000 mg | ORAL_TABLET | Freq: Every day | ORAL | Status: DC

## 2013-11-09 NOTE — Telephone Encounter (Signed)
Pt's wife would like to address a couple of questions. Please call her.

## 2013-11-09 NOTE — Patient Instructions (Addendum)
1.  I think you should get off of the Abilify but I would like you to discuss with Dr. Etter Sjogren.   2.  I will place an order for Zoloft 25mg  at bedtime for depression. 3.  I will review Dr. Jenne Campus notes and we will refer you to appropriate cognitive rehab. 4.  I would strongly consider referral to psychiatrist to address appropriate medications for mood. 5.  Follow up in 3 months.

## 2013-11-09 NOTE — Progress Notes (Addendum)
NEUROLOGY FOLLOW UP OFFICE NOTE  JAZIYAH FAUBLE [id number]  HISTORY OF PRESENT ILLNESS: William [name] is a 73 year old right-handed man with history of stroke, carotid artery disease, hyperlipidemia, and depression who presents for evaluation and management of cerebrovascular disease.  He is accompanied by his wife.  Records and images were personally reviewed where available.    UPDATE: He had neuropsychological testing, however the report is not yet available to me.  It did reveal impairment in executive functioning and memory is preserved.  Findings did suggest dysfunction as result of the stroke rather than dementia.  It is also noted that he exhibits tardive dyskinesia, which may be due to Abilify.  He has had significant mood problems off of Abilify, however.  He also reports feeling depressed at times, sometimes worse than other days.  10/16/13 LABS:  B12 720, TSH 1.668 10/19/13 MRI BRAIN WO:  stable remote large right MCA territory infarct.  Chronic microvascular ischemic changes.  HISTORY: He had a stroke in 2006, after experiencing left facial droop, left arm and leg weakness, as well slurred vision and vision problems.  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  He has some residual left sided weakness and facial weakness.  He underwent left carotid endarterectomy in 2010 for asymptomatic stenosis.  Over the past year and a half, he and his wife have noted a gradual progression and cognitive deficits. Onset correlated during a period when he was experiencing dizziness and syncope secondary to orthostatic hypotension. At that time, he was dehydrated and not drinking much fluids. He was instructed to start increasing his fluid intake as well as his sodium intake. Now, he tries to drink 3 bottles of water per day. He particularly notes confusion regarding how to perform certain everyday tasks. For example, it takes him a long time to unload the  dishwasher because he has trouble figuring out where to put the dishes. He use to enjoy cooking and preparing meals. Now he has difficulty cooking and can only see things up in the microwave. He also has been experiencing dressing apraxia. Sometimes he will put both his feet into one leg of his pants. Other times, he will mis-button his shirts. He denies any language dysfunction such as difficulty understanding other people or other people not understanding him. He has no trouble reading or writing. His short-term and long-term memory are pretty good. He denies any difficulty with face recognition. He denies hallucinations or delusions. He has not had any change in his personality or behavior. He still drives but very seldom and only locally. He has not had any problems with accidents or near accidents, but he says he drives slowly because he is very nervous and cautious. He does have history of anxiety and often shakes when he gets nervous. He denies any family history of dementia. However, there is a psychiatric history with his mother and sister.  He denies history of alcohol abuse or drug use.  05/21/13 LABS:  LDL 57, Hgb A1c 6.0 05/17/13 Carotid doppler: bilateral 1-39% stenosis of the right proximal ICA and no stenosis of the left ICA. 03/11/12 MRI Brain wo contrast:  Remote large right MCA .  PAST MEDICAL HISTORY: Past Medical History  Diagnosis Date  . Hyperlipidemia   . ANXIETY   . Urinary frequency   . BPH (benign prostatic hypertrophy)   . Eczema   . Bladder stone   . History of carotid artery stenosis  S/P BILATERAL CEA  . S/P carotid endarterectomy     BILATERAL ICA--  PATENT PER DUPLEX  05-19-2012  . CVD (cerebrovascular disease)   . Vitamin D deficiency   . 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 GERD (gastroesophageal reflux disease)   . Nocturia   . Stroke 1996    left side weakness  . Carotid artery occlusion      MEDICATIONS: Current Outpatient Prescriptions on File Prior to Visit  Medication Sig Dispense Refill  . ALPRAZolam (XANAX) 0.25 MG tablet TAKE 1 TABLET BY MOUTH TWICE DAILY *DUE FOR OFFICE VISIT*  60 tablet  2  . ARIPiprazole (ABILIFY) 10 MG tablet Take 0.5 tablets (5 mg total) by mouth daily.  15 tablet  5  . aspirin 325 MG EC tablet Take 325 mg by mouth daily.      . B Complex Vitamins (VITAMIN B COMPLEX PO) Take by mouth daily.        . Calcium Carb-Cholecalciferol (CALCIUM 1000 + D PO) Take by mouth daily.        . CRESTOR 40 MG tablet Take 1 tablet (40 mg total) by mouth daily.  90 tablet  1  . diphenhydrAMINE (SOMINEX) 25 MG tablet Take 25 mg by mouth at bedtime as needed for sleep.      [name] escitalopram (LEXAPRO) 20 MG tablet TAKE 1 TABLET BY MOUTH DAILY.  90 tablet  1  . esomeprazole (NEXIUM) 40 MG capsule Take 40 mg by mouth as needed.      . fenofibrate 160 MG tablet TAKE 1 TABLET BY MOUTH DAILY  30 tablet  2  . Flaxseed, Linseed, (FLAX SEED OIL PO) Take by mouth daily.        . Multiple Vitamin (MULTIVITAMIN) tablet Take 1 tablet by mouth daily.       No current facility-administered medications on file prior to visit.    ALLERGIES: Allergies  Allergen Reactions  . Adhesive [Tape] Other (See Comments)    BLISTER    FAMILY HISTORY: Family History  Problem Relation Age of Onset  . Heart disease Mother     CHF  . Heart disease Father     CHF    SOCIAL HISTORY: History   Social History  . Marital Status: Married    Spouse Name: N/A    Number of Children: 2  . Years of Education: N/A   Occupational History  .     Social History Main Topics  . Smoking status: Former Smoker -- 2.00 packs/day for 40 years    Types: Cigarettes    Quit date: 02/15/2005  . Smokeless tobacco: Never Used  . Alcohol Use: 0.0 oz/week     Comment: Occasional  . Drug Use: No  . Sexual Activity: Not on file   Other Topics Concern  . Not on file   Social History Narrative    Exercise--  Walks dogs everyday    REVIEW OF SYSTEMS: Constitutional: No fevers, chills, or sweats, no generalized fatigue, change in appetite Eyes: No visual changes, double vision, eye pain Ear, nose and throat: No hearing loss, ear pain, nasal congestion, sore throat Cardiovascular: No chest pain, palpitations Respiratory:  No shortness of breath at rest or with exertion, wheezes GastrointestinaI: No nausea, vomiting, diarrhea, abdominal pain, fecal incontinence Genitourinary:  No dysuria, urinary retention or frequency Musculoskeletal:  No neck pain, back pain Integumentary: No rash, pruritus, skin lesions Neurological: as above Psychiatric: No depression, insomnia,  anxiety Endocrine: No palpitations, fatigue, diaphoresis, mood swings, change in appetite, change in weight, increased thirst Hematologic/Lymphatic:  No anemia, purpura, petechiae. Allergic/Immunologic: no itchy/runny eyes, nasal congestion, recent allergic reactions, rashes  PHYSICAL EXAM: Filed Vitals:   11/09/13 0846  BP: 138/74  Pulse: 68  Temp: 98 F (36.7 C)  Resp: 18   General: No acute distress Head:  Normocephalic/atraumatic  IMPRESSION: 1.  Cognitive impairment involving executive dysfunction, as sequela of remote right MCA territorial infarct. 2.  Cerebrovascular disease with history of right MCA territorial infarct. 3.  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  PLAN: 1.  For secondary stroke prevention: Continue antiplatelet therapy. Continue statin (LDL at goal of less than 100). Increase exercise, Mediterranean diet. 2.  Would recommend tapering off Abilify.  Takes 5mg  daily.  Would take every other day for 1 week then stop.  Recommended discussing with Dr. Etter Sjogren first. 3.  Will prescribe Zoloft 25mg  daily for depression. 4.  Consider referral to psychiatrist to address appropriate treatment of mood. 5.  Follow up in 3 months.  30 minutes spent with the patient and his wife, 100% spent  counseling and coordinating care.  Metta Clines, DO  CC:  Garnet Koyanagi, DO

## 2013-12-06 ENCOUNTER — Other Ambulatory Visit: Payer: Self-pay | Admitting: Neurology

## 2013-12-11 ENCOUNTER — Ambulatory Visit (INDEPENDENT_AMBULATORY_CARE_PROVIDER_SITE_OTHER): Payer: 59 | Admitting: Family Medicine

## 2013-12-11 ENCOUNTER — Encounter: Payer: Self-pay | Admitting: Family Medicine

## 2013-12-11 VITALS — BP 114/70 | HR 61 | Temp 97.4°F | Wt 221.0 lb

## 2013-12-11 DIAGNOSIS — H6122 Impacted cerumen, left ear: Secondary | ICD-10-CM

## 2013-12-11 DIAGNOSIS — H6120 Impacted cerumen, unspecified ear: Secondary | ICD-10-CM

## 2013-12-11 NOTE — Progress Notes (Signed)
   Subjective:    Patient ID: William [name], male    DOB: Jan 05, 1941, 73 y.o.   MRN: [id number]  HPI Pt here c/o not being able to hear out of L ear.  No other complaints.     Review of Systems As above    Objective:   Physical Exam BP 114/70  Pulse 61  Temp(Src) 97.4 F (36.3 C) (Oral)  Wt 221 lb (100.245 kg)  SpO2 97% General appearance: alert, cooperative, appears stated age and no distress Ears: L ear + cerumen impaction. -- unable to remove with hoop, irrated successfully and pt able to hear     Assessment & Plan:  1. Cerumen impaction---  Irrigated successfully---        rto prn

## 2013-12-11 NOTE — Progress Notes (Signed)
Pre visit review using our clinic review tool, if applicable. No additional management support is needed unless otherwise documented below in the visit note. 

## 2013-12-24 ENCOUNTER — Other Ambulatory Visit: Payer: Self-pay | Admitting: Family Medicine

## 2013-12-24 ENCOUNTER — Telehealth: Payer: Self-pay | Admitting: Family Medicine

## 2013-12-24 ENCOUNTER — Encounter: Payer: Self-pay | Admitting: Family Medicine

## 2013-12-24 ENCOUNTER — Ambulatory Visit (INDEPENDENT_AMBULATORY_CARE_PROVIDER_SITE_OTHER): Payer: 59 | Admitting: Family Medicine

## 2013-12-24 VITALS — BP 116/76 | HR 63 | Temp 98.2°F | Wt 222.0 lb

## 2013-12-24 DIAGNOSIS — N400 Enlarged prostate without lower urinary tract symptoms: Secondary | ICD-10-CM

## 2013-12-24 DIAGNOSIS — E785 Hyperlipidemia, unspecified: Secondary | ICD-10-CM

## 2013-12-24 DIAGNOSIS — F411 Generalized anxiety disorder: Secondary | ICD-10-CM

## 2013-12-24 DIAGNOSIS — H9190 Unspecified hearing loss, unspecified ear: Secondary | ICD-10-CM

## 2013-12-24 DIAGNOSIS — I10 Essential (primary) hypertension: Secondary | ICD-10-CM

## 2013-12-24 DIAGNOSIS — H9192 Unspecified hearing loss, left ear: Secondary | ICD-10-CM

## 2013-12-24 DIAGNOSIS — R82998 Other abnormal findings in urine: Secondary | ICD-10-CM

## 2013-12-24 DIAGNOSIS — R8290 Unspecified abnormal findings in urine: Secondary | ICD-10-CM

## 2013-12-24 LAB — BASIC METABOLIC PANEL
BUN: 17 mg/dL (ref 6–23)
CO2: 27 mEq/L (ref 19–32)
Calcium: 9.4 mg/dL (ref 8.4–10.5)
Chloride: 109 mEq/L (ref 96–112)
Creatinine, Ser: 1.4 mg/dL (ref 0.4–1.5)
GFR: 54.38 mL/min — AB (ref >60.00)
Glucose, Bld: 100 mg/dL — ABNORMAL HIGH (ref 70–99)
Potassium: 4 mEq/L (ref 3.5–5.1)
SODIUM: 141 meq/L (ref 135–145)

## 2013-12-24 LAB — HEPATIC FUNCTION PANEL
ALBUMIN: 3.8 g/dL (ref 3.5–5.2)
ALK PHOS: 57 U/L (ref 39–117)
ALT: 26 U/L (ref 0–53)
AST: 31 U/L (ref 0–37)
BILIRUBIN DIRECT: 0.2 mg/dL (ref 0.0–0.3)
BILIRUBIN TOTAL: 0.6 mg/dL (ref 0.2–1.2)
TOTAL PROTEIN: 7.3 g/dL (ref 6.0–8.3)

## 2013-12-24 LAB — POCT URINALYSIS DIPSTICK
Bilirubin, UA: NEGATIVE
Blood, UA: NEGATIVE
Glucose, UA: NEGATIVE
Ketones, UA: NEGATIVE
Nitrite, UA: NEGATIVE
PH UA: 6.5
Spec Grav, UA: 1.01
Urobilinogen, UA: 0.2

## 2013-12-24 LAB — LIPID PANEL
Cholesterol: 117 mg/dL (ref 0–200)
HDL: 46.3 mg/dL (ref >39.00)
LDL CALC: 49 mg/dL (ref 0–99)
NonHDL: 70.7
Total CHOL/HDL Ratio: 3
Triglycerides: 108 mg/dL (ref 0.0–149.0)
VLDL: 21.6 mg/dL (ref 0.0–40.0)

## 2013-12-24 MED ORDER — FENOFIBRATE 160 MG PO TABS: ORAL_TABLET | ORAL | Status: DC

## 2013-12-24 MED ORDER — ALPRAZOLAM 0.25 MG PO TABS: ORAL_TABLET | ORAL | Status: DC

## 2013-12-24 MED ORDER — SERTRALINE HCL 50 MG PO TABS: 50.0000 mg | ORAL_TABLET | Freq: Every day | ORAL | Status: DC

## 2013-12-24 NOTE — Patient Instructions (Signed)
Generalized Anxiety Disorder  Generalized anxiety disorder (GAD) is a mental disorder. It interferes with life functions, including relationships, work, and school.  GAD is different from normal anxiety, which everyone experiences at some point in their lives in response to specific life events and activities. Normal anxiety actually helps us prepare for and get through these life events and activities. Normal anxiety goes away after the event or activity is over.   GAD causes anxiety that is not necessarily related to specific events or activities. It also causes excess anxiety in proportion to specific events or activities. The anxiety associated with GAD is also difficult to control. GAD can vary from mild to severe. People with severe GAD can have intense waves of anxiety with physical symptoms (panic attacks).   SYMPTOMS  The anxiety and worry associated with GAD are difficult to control. This anxiety and worry are related to many life events and activities and also occur more days than not for 6 months or longer. People with GAD also have three or more of the following symptoms (one or more in children):  · Restlessness.    · Fatigue.  · Difficulty concentrating.    · Irritability.  · Muscle tension.  · Difficulty sleeping or unsatisfying sleep.  DIAGNOSIS  GAD is diagnosed through an assessment by your caregiver. Your caregiver will ask you questions about your mood, physical symptoms, and events in your life. Your caregiver may ask you about your medical history and use of alcohol or drugs, including prescription medications. Your caregiver may also do a physical exam and blood tests. Certain medical conditions and the use of certain substances can cause symptoms similar to those associated with GAD. Your caregiver may refer you to a mental health specialist for further evaluation.  TREATMENT  The following therapies are usually used to treat GAD:   · Medication--Antidepressant medication usually is  prescribed for long-term daily control. Antianxiety medications may be added in severe cases, especially when panic attacks occur.    · Talk therapy (psychotherapy)--Certain types of talk therapy can be helpful in treating GAD by providing support, education, and guidance. A form of talk therapy called cognitive behavioral therapy can teach you healthy ways to think about and react to daily life events and activities.  · Stress management techniques--These include yoga, meditation, and exercise and can be very helpful when they are practiced regularly.  A mental health specialist can help determine which treatment is best for you. Some people see improvement with one therapy. However, other people require a combination of therapies.  Document Released: 09/25/2012 Document Reviewed: 09/25/2012  [employer]® Patient Information ©2015 [employer]. This information is not intended to replace advice given to you by your health care provider. Make sure you discuss any questions you have with your health care provider.

## 2013-12-24 NOTE — Addendum Note (Signed)
Addended by: Modena Morrow D on: 12/24/2013 05:14 PM   Modules accepted: Orders

## 2013-12-24 NOTE — Progress Notes (Signed)
  Subjective:    Patient here for follow-up of elevated blood pressure.  He is not exercising and is adherent to a low-salt diet.  Blood pressure is well controlled at home. Cardiac symptoms: none. Patient denies: chest pain, chest pressure/discomfort, claudication, dyspnea, exertional chest pressure/discomfort, fatigue, irregular heart beat, lower extremity edema, near-syncope, orthopnea, palpitations, paroxysmal nocturnal dyspnea, syncope and tachypnea. Cardiovascular risk factors: advanced age (older than 66 for men, 21 for women), dyslipidemia, hypertension, male gender and sedentary lifestyle. Use of agents associated with hypertension: none. History of target organ damage: stroke.  Pt is also c/o inc anxiety-- he has been taking more xanax than usual per wife.  He is also here f/u cholesterol and c/o dec hearing even after ears cleaned out.   No other complaints.    The following portions of the patient's history were reviewed and updated as appropriate: allergies, current medications, past family history, past medical history, past social history, past surgical history and problem list.  Review of Systems Pertinent items are noted in HPI.     Objective:    BP 116/76  Pulse 63  Temp(Src) 98.2 F (36.8 C) (Oral)  Wt 222 lb (100.699 kg)  SpO2 95% General appearance: alert, cooperative, appears stated age and no distress HEENT-- ears clear, TMI normal Neck: no adenopathy, no carotid bruit, no JVD, supple, symmetrical, trachea midline and thyroid not enlarged, symmetric, no tenderness/mass/nodules Lungs: clear to auscultation bilaterally Heart: S1, S2 normal Extremities: extremities normal, atraumatic, no cyanosis or edema Neurologic: Alert and oriented X 3, normal strength and tone. Normal symmetric reflexes. Normal coordination and gait Mental status: Alert, oriented, thought content appropriate, affect: normal, thought content exhibits logical connections, when questioned about  suicide, the patient expresses no suicidal ideation, no homicidal ideation    Assessment:    Hypertension, normal blood pressure . Evidence of target organ damage: stroke.    Plan:     Medication: no change. Dietary sodium restriction. Regular aerobic exercise. Follow up: 6 months and as needed.   1. Generalized anxiety disorder D/c lexapro--  Inc zoloft 50 mg ( started by neuro ) - sertraline (ZOLOFT) 50 MG tablet; Take 1 tablet (50 mg total) by mouth daily.  Dispense: 90 tablet; Refill: 3 Refill xanax 2. Other and unspecified hyperlipidemia Check labs - fenofibrate 160 MG tablet; TAKE 1 TABLET BY MOUTH DAILY  Dispense: 90 tablet; Refill: 3 - Hepatic function panel - Lipid panel  3. BPH (benign prostatic hypertrophy)  - PSA  4. Essential hypertension Stable, con't meds - Basic metabolic panel - POCT urinalysis dipstick  5. Hearing loss, left = - Ambulatory referral to Audiology

## 2013-12-24 NOTE — Progress Notes (Signed)
Pre visit review using our clinic review tool, if applicable. No additional management support is needed unless otherwise documented below in the visit note. 

## 2013-12-24 NOTE — Telephone Encounter (Signed)
Relevant patient education assigned to patient using Emmi. ° °

## 2013-12-25 LAB — PSA: PSA: 1.94 ng/mL (ref 0.10–4.00)

## 2013-12-29 ENCOUNTER — Encounter: Payer: Self-pay | Admitting: Family Medicine

## 2013-12-29 LAB — URINE CULTURE: Colony Count: >=100000

## 2013-12-31 ENCOUNTER — Telehealth: Payer: Self-pay

## 2013-12-31 DIAGNOSIS — N390 Urinary tract infection, site not specified: Secondary | ICD-10-CM

## 2013-12-31 MED ORDER — SULFAMETHOXAZOLE-TRIMETHOPRIM 400-80 MG PO TABS: 1.0000 | ORAL_TABLET | Freq: Two times a day (BID) | ORAL | Status: DC

## 2013-12-31 NOTE — Telephone Encounter (Signed)
See telephone notes

## 2013-12-31 NOTE — Telephone Encounter (Signed)
Spoke with patient concerning MyChart message. Per Dr. Birdie Riddle access patient symptoms. Spouse states that the patient is becoming increasingly anxious even though the Zoloft has been increased. Advised that the increase will take time. Symptoms are consistent with the 12/24/2013 OV with PCP. No change at this point. Advised that the patient currently has a UTI which can account for some of the feelings that he is experiencing. Advised that we would send in an antibiotic to the pharmacy so that he could start today. Advised spouse that if symptoms do not improve to contact the office. Spouse verbalizes understanding. Bactrim 80/400mg  sent [facility].

## 2014-01-02 ENCOUNTER — Telehealth: Payer: Self-pay | Admitting: Family Medicine

## 2014-01-07 ENCOUNTER — Encounter: Payer: Self-pay | Admitting: Family Medicine

## 2014-01-07 ENCOUNTER — Encounter: Payer: Self-pay | Admitting: Neurology

## 2014-01-08 ENCOUNTER — Encounter: Payer: Self-pay | Admitting: Family Medicine

## 2014-01-08 NOTE — Telephone Encounter (Signed)
Pt has been scheduled an appt for 01/09/14. Pt is aware / Sherri

## 2014-01-09 ENCOUNTER — Encounter: Payer: Self-pay | Admitting: Neurology

## 2014-01-09 ENCOUNTER — Telehealth: Payer: Self-pay | Admitting: Neurology

## 2014-01-09 ENCOUNTER — Ambulatory Visit (INDEPENDENT_AMBULATORY_CARE_PROVIDER_SITE_OTHER): Payer: 59 | Admitting: Neurology

## 2014-01-09 VITALS — BP 120/78 | HR 71 | Resp 18 | Ht 71.0 in | Wt 223.0 lb

## 2014-01-09 DIAGNOSIS — H539 Unspecified visual disturbance: Secondary | ICD-10-CM

## 2014-01-09 DIAGNOSIS — H538 Other visual disturbances: Secondary | ICD-10-CM

## 2014-01-09 DIAGNOSIS — R41842 Visuospatial deficit: Secondary | ICD-10-CM

## 2014-01-09 DIAGNOSIS — F0391 Unspecified dementia with behavioral disturbance: Secondary | ICD-10-CM

## 2014-01-09 DIAGNOSIS — F03918 Unspecified dementia, unspecified severity, with other behavioral disturbance: Secondary | ICD-10-CM

## 2014-01-09 DIAGNOSIS — Z8673 Personal history of transient ischemic attack (TIA), and cerebral infarction without residual deficits: Secondary | ICD-10-CM

## 2014-01-09 DIAGNOSIS — F0390 Unspecified dementia without behavioral disturbance: Secondary | ICD-10-CM | POA: Insufficient documentation

## 2014-01-09 DIAGNOSIS — G2401 Drug induced subacute dyskinesia: Secondary | ICD-10-CM

## 2014-01-09 HISTORY — DX: Unspecified dementia, unspecified severity, without behavioral disturbance, psychotic disturbance, mood disturbance, and anxiety: F03.90

## 2014-01-09 MED ORDER — DONEPEZIL HCL 5 MG PO TABS: 5.0000 mg | ORAL_TABLET | Freq: Every day | ORAL | Status: DC

## 2014-01-09 NOTE — Patient Instructions (Addendum)
1.  We will start donepezil (Aricept) 5mg  daily for four weeks.  If you are tolerating the medication, then after four weeks, we will increase the dose to 10mg  daily.  Side effects include nausea, vomiting, diarrhea, vivid dreams, and muscle cramps.  Please call the clinic if you experience any of these symptoms. 2.  We will refer to optometry for eye exam. We have you scheduled with [facility] on 01/16/14 to arrive at 1:40 pm.  They are located at [address] [place], [name]. If you need to reschedule for any reason please call [phone]. 3.  We will refer to psychiatry to assess and manage behavioral symptoms. Dr Plovsky's office will call you directly with an appt.  4.  Follow up in 3 months.  Call with questions or concerns.

## 2014-01-09 NOTE — Telephone Encounter (Signed)
Referral faxed to Dr Casimiro Needle at [phone] with confirmation received. They will contact patient with appt.

## 2014-01-09 NOTE — Progress Notes (Signed)
NEUROLOGY FOLLOW UP OFFICE NOTE  William [name] [id number]  HISTORY OF PRESENT ILLNESS: William [name] is a 73 year old right-handed man with history of stroke, carotid artery disease, hyperlipidemia, and depression who follows up to discuss neuropsych results regarding dementia and history of stroke.  He is accompanied by his wife.   UPDATE: He underwent neuropsychological testing on 10/25/13 with Dr. 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  Memory and left hemispheric medial temporal functions were intact.  Findings showed deficits in visual processing and executive dysfunction.  Onset of visual-spatial deficits and apraxia may suggest occipital lobe involvement, such as due to an Alzheimer's variant called posterior cortical atrophy.  MRI from May 2015 revealed global atrophy but it does not seem more prominent in the occipital lobes.  He was subsequently taken off Abilify due to the tardive dyskinesia.  Since that time, he began to have racing thoughts and pressured speech, with persistent babbling, such as what is happening on the TV.  He always feels a sense of urgency and has trouble sleeping.  His Lexapro was discontinued completely and the sertraline was increased from 25mg  to 50mg .  HISTORY: He had a stroke in 2006, after experiencing left facial droop, left arm and leg weakness, as well slurred vision and vision problems.  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  He has some residual left sided weakness and facial weakness.  He underwent left carotid endarterectomy in 2010 for asymptomatic stenosis.  Over the past year and a half, he and his wife have noted a gradual progression and cognitive deficits. Onset correlated during a period when he was experiencing dizziness and syncope secondary to orthostatic hypotension. At that time, he was dehydrated and not  drinking much fluids. He was instructed to start increasing his fluid intake as well as his sodium intake. Now, he tries to drink 3 bottles of water per day. He particularly notes confusion regarding how to perform certain everyday tasks. For example, it takes him a long time to unload the dishwasher because he has trouble figuring out where to put the dishes. He use to enjoy cooking and preparing meals. Now he has difficulty cooking and can only see things up in the microwave. He also has been experiencing dressing apraxia. Sometimes he will put both his feet into one leg of his pants. Other times, he has difficulty buttoning his shirts. He denies any language dysfunction such as difficulty understanding other people or other people not understanding him. He has no trouble reading or writing. His short-term and long-term memory are pretty good. He denies any difficulty with face recognition. He denies hallucinations or delusions. He has not had any change in his personality or behavior. He still drives but very seldom and only locally. He has not had any problems with accidents or near accidents, but he says he drives slowly because he is very nervous and cautious. He does have history of anxiety and often shakes when he gets nervous. He denies any family history of dementia. However, there is a psychiatric history with his mother and sister.  He denies history of alcohol abuse or drug use.  10/16/13 LABS:  B12 720, TSH 1.668 10/19/13 MRI BRAIN WO:  stable remote large right MCA territory infarct.  Chronic microvascular ischemic changes. 05/21/13 LABS:  LDL 57, Hgb A1c 6.0 05/17/13 Carotid doppler: bilateral 1-39% stenosis of the right proximal ICA  and no stenosis of the left ICA. 03/11/12 MRI Brain wo contrast:  Remote large right MCA infarct.  PAST MEDICAL HISTORY: Past Medical History  Diagnosis Date  . Hyperlipidemia   . ANXIETY   . Urinary frequency   . BPH (benign prostatic hypertrophy)   . Eczema     . Bladder stone   . History of carotid artery stenosis     S/P BILATERAL CEA  . S/P carotid endarterectomy     BILATERAL ICA--  PATENT PER DUPLEX  05-19-2012  . CVD (cerebrovascular disease)   . Vitamin D deficiency   . 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 GERD (gastroesophageal reflux disease)   . Nocturia   . Stroke 1996    left side weakness  . Carotid artery occlusion     MEDICATIONS: Current Outpatient Prescriptions on File Prior to Visit  Medication Sig Dispense Refill  . ALPRAZolam (XANAX) 0.25 MG tablet TAKE 1 TABLET BY MOUTH tid prn  60 tablet  2  . aspirin 325 MG EC tablet Take 325 mg by mouth daily.      . B Complex Vitamins (VITAMIN B COMPLEX PO) Take by mouth daily.        . Calcium Carb-Cholecalciferol (CALCIUM 1000 + D PO) Take by mouth daily.        . Cholecalciferol (VITAMIN D) 2000 UNITS CAPS Take by mouth.      . CRESTOR 40 MG tablet Take 1 tablet (40 mg total) by mouth daily.  90 tablet  1  . diphenhydrAMINE (SOMINEX) 25 MG tablet Take 25 mg by mouth at bedtime as needed for sleep.      [name] esomeprazole (NEXIUM) 40 MG capsule Take 40 mg by mouth as needed.      . fenofibrate 160 MG tablet TAKE 1 TABLET BY MOUTH DAILY  90 tablet  3  . Flaxseed, Linseed, (FLAX SEED OIL PO) Take by mouth daily.        . Multiple Vitamin (MULTIVITAMIN) tablet Take 1 tablet by mouth daily.      . sertraline (ZOLOFT) 50 MG tablet Take 1 tablet (50 mg total) by mouth daily.  90 tablet  3  . sulfamethoxazole-trimethoprim (BACTRIM) 400-80 MG per tablet Take 1 tablet by mouth 2 (two) times daily.  14 tablet  0   No current facility-administered medications on file prior to visit.    ALLERGIES: Allergies  Allergen Reactions  . Adhesive [Tape] Other (See Comments)    BLISTER    FAMILY HISTORY: Family History  Problem Relation Age of Onset  . Heart disease Mother     CHF  . Heart disease Father     CHF    SOCIAL HISTORY: History    Social History  . Marital Status: Married    Spouse Name: N/A    Number of Children: 2  . Years of Education: N/A   Occupational History  .     Social History Main Topics  . Smoking status: Former Smoker -- 2.00 packs/day for 40 years    Types: Cigarettes    Quit date: 02/15/2005  . Smokeless tobacco: Never Used  . Alcohol Use: 0.0 oz/week     Comment: Occasional  . Drug Use: No  . Sexual Activity: Not on file   Other Topics Concern  . Not on file   Social History Narrative   Exercise--  Walks dogs everyday    REVIEW OF SYSTEMS: Constitutional: No fevers,  chills, or sweats, no generalized fatigue, change in appetite Eyes: No visual changes, double vision, eye pain Ear, nose and throat: No hearing loss, ear pain, nasal congestion, sore throat Cardiovascular: No chest pain, palpitations Respiratory:  No shortness of breath at rest or with exertion, wheezes GastrointestinaI: No nausea, vomiting, diarrhea, abdominal pain, fecal incontinence Genitourinary:  No dysuria, urinary retention or frequency Musculoskeletal:  No neck pain, back pain Integumentary: No rash, pruritus, skin lesions Neurological: as above Psychiatric: Racing thoughts, sense of uneasiness, insomnia Endocrine: No palpitations, fatigue, diaphoresis, mood swings, change in appetite, change in weight, increased thirst Hematologic/Lymphatic:  No anemia, purpura, petechiae. Allergic/Immunologic: no itchy/runny eyes, nasal congestion, recent allergic reactions, rashes  PHYSICAL EXAM: Filed Vitals:   01/09/14 0820  BP: 120/78  Pulse: 71  Resp: 18   General: No acute distress Head:  Normocephalic/atraumatic  IMPRESSION: Dementia with behavioral symptoms.  Etiology unclear.  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  He has no memory deficits, but perhaps it may stabilize progression.  If tolerating in 4 weeks, would increase to 10mg  at bedtime. For secondary stroke prevention: Continue antiplatelet therapy. Continue statin (LDL at goal of less than 100). Increase exercise, Mediterranean diet. Referral to psychiatrist regarding psychiatric symptoms. Follow up in 3 months.  30 minutes spent with patient and wife, 100% spent discussing results of testing, discussing possible diagnoses and coordinating care.  Metta Clines, DO  CC:  Garnet Koyanagi, DO

## 2014-01-10 ENCOUNTER — Other Ambulatory Visit (INDEPENDENT_AMBULATORY_CARE_PROVIDER_SITE_OTHER): Payer: 59

## 2014-01-10 DIAGNOSIS — N390 Urinary tract infection, site not specified: Secondary | ICD-10-CM

## 2014-01-10 LAB — POCT URINALYSIS DIPSTICK
BILIRUBIN UA: NEGATIVE
Blood, UA: NEGATIVE
Glucose, UA: NEGATIVE
Ketones, UA: NEGATIVE
LEUKOCYTES UA: NEGATIVE
NITRITE UA: NEGATIVE
PH UA: 6
Protein, UA: NEGATIVE
Spec Grav, UA: 1.01
Urobilinogen, UA: 0.2

## 2014-01-12 ENCOUNTER — Encounter: Payer: Self-pay | Admitting: Neurology

## 2014-01-12 ENCOUNTER — Encounter: Payer: Self-pay | Admitting: Family Medicine

## 2014-01-28 ENCOUNTER — Other Ambulatory Visit: Payer: Self-pay | Admitting: Family Medicine

## 2014-02-03 ENCOUNTER — Encounter: Payer: Self-pay | Admitting: Neurology

## 2014-02-05 ENCOUNTER — Other Ambulatory Visit: Payer: Self-pay | Admitting: *Deleted

## 2014-02-05 MED ORDER — DONEPEZIL HCL 10 MG PO TABS: 10.0000 mg | ORAL_TABLET | Freq: Every day | ORAL | Status: DC

## 2014-02-05 NOTE — Telephone Encounter (Signed)
Pt called wanting to f/u on the message below.  C/B [id number]

## 2014-02-06 ENCOUNTER — Other Ambulatory Visit: Payer: Self-pay

## 2014-02-06 MED ORDER — ESOMEPRAZOLE MAGNESIUM 40 MG PO CPDR: 40.0000 mg | DELAYED_RELEASE_CAPSULE | ORAL | Status: DC | PRN

## 2014-02-11 ENCOUNTER — Ambulatory Visit (INDEPENDENT_AMBULATORY_CARE_PROVIDER_SITE_OTHER): Payer: 59 | Admitting: Physician Assistant

## 2014-02-11 ENCOUNTER — Ambulatory Visit (HOSPITAL_BASED_OUTPATIENT_CLINIC_OR_DEPARTMENT_OTHER)
Admission: RE | Admit: 2014-02-11 | Discharge: 2014-02-11 | Disposition: A | Discharge status: Home / Self Care | Payer: 59 | Source: Ambulatory Visit | Attending: Physician Assistant | Admitting: Physician Assistant

## 2014-02-11 ENCOUNTER — Encounter: Payer: Self-pay | Admitting: Family Medicine

## 2014-02-11 ENCOUNTER — Encounter: Payer: Self-pay | Admitting: Physician Assistant

## 2014-02-11 VITALS — BP 110/70 | HR 64 | Temp 98.0°F | Wt 226.0 lb

## 2014-02-11 DIAGNOSIS — M79675 Pain in left toe(s): Secondary | ICD-10-CM

## 2014-02-11 DIAGNOSIS — M79676 Pain in unspecified toe(s): Secondary | ICD-10-CM | POA: Insufficient documentation

## 2014-02-11 DIAGNOSIS — M79609 Pain in unspecified limb: Secondary | ICD-10-CM | POA: Insufficient documentation

## 2014-02-11 MED ORDER — TRAMADOL HCL 50 MG PO TABS: 50.0000 mg | ORAL_TABLET | Freq: Two times a day (BID) | ORAL | Status: DC

## 2014-02-11 NOTE — Progress Notes (Signed)
Patient presents to clinic today c/o pain of his left great toe x 2 weeks.  Patient denies redness, warmth or swelling.  Denies known trauma or injury. Denies decreased ROM, numbness or tingling. Denies significant alcohol consumption.  Denies history or gout or pseudogout.  Past Medical History  Diagnosis Date  . Hyperlipidemia   . ANXIETY   . Urinary frequency   . BPH (benign prostatic hypertrophy)   . Eczema   . Bladder stone   . History of carotid artery stenosis     S/P BILATERAL CEA  . S/P carotid endarterectomy     BILATERAL ICA--  PATENT PER DUPLEX  05-19-2012  . CVD (cerebrovascular disease)   . Vitamin D deficiency   . 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 GERD (gastroesophageal reflux disease)   . Nocturia   . Stroke 1996    left side weakness  . Carotid artery occlusion     Current Outpatient Prescriptions on File Prior to Visit  Medication Sig Dispense Refill  . ALPRAZolam (XANAX) 0.25 MG tablet TAKE 1 TABLET BY MOUTH tid prn  60 tablet  2  . aspirin 325 MG EC tablet Take 325 mg by mouth daily.      . B Complex Vitamins (VITAMIN B COMPLEX PO) Take by mouth daily.        . Calcium Carb-Cholecalciferol (CALCIUM 1000 + D PO) Take by mouth daily.        . Cholecalciferol (VITAMIN D) 2000 UNITS CAPS Take by mouth.      . CRESTOR 40 MG tablet TAKE 1 TABLET BY MOUTH DAILY.  90 tablet  3  . diphenhydrAMINE (SOMINEX) 25 MG tablet Take 25 mg by mouth at bedtime as needed for sleep.      [name] donepezil (ARICEPT) 10 MG tablet Take 1 tablet (10 mg total) by mouth at bedtime.  30 tablet  3  . esomeprazole (NEXIUM) 40 MG capsule Take 1 capsule (40 mg total) by mouth as needed.  90 capsule  3  . fenofibrate 160 MG tablet TAKE 1 TABLET BY MOUTH DAILY  90 tablet  3  . Flaxseed, Linseed, (FLAX SEED OIL PO) Take by mouth daily.        . Multiple Vitamin (MULTIVITAMIN) tablet Take 1 tablet by mouth daily.      . sertraline (ZOLOFT) 50 MG tablet  Take 1 tablet (50 mg total) by mouth daily.  90 tablet  3  . donepezil (ARICEPT) 5 MG tablet Take 1 tablet (5 mg total) by mouth at bedtime.  30 tablet  0  . sulfamethoxazole-trimethoprim (BACTRIM) 400-80 MG per tablet Take 1 tablet by mouth 2 (two) times daily.  14 tablet  0   No current facility-administered medications on file prior to visit.    Allergies  Allergen Reactions  . Adhesive [Tape] Other (See Comments)    BLISTER    Family History  Problem Relation Age of Onset  . Heart disease Mother     CHF  . Heart disease Father     CHF    History   Social History  . Marital Status: Married    Spouse Name: N/A    Number of Children: 2  . Years of Education: N/A   Occupational History  .     Social History Main Topics  . Smoking status: Former Smoker -- 2.00 packs/day for 40 years    Types: Cigarettes    Quit date: 02/15/2005  .  Smokeless tobacco: Never Used  . Alcohol Use: 0.0 oz/week     Comment: Occasional  . Drug Use: No  . Sexual Activity: None   Other Topics Concern  . None   Social History Narrative   Exercise--  Walks dogs everyday    Review of Systems - See HPI.  All other ROS are negative.  BP 110/70  Pulse 64  Temp(Src) 98 F (36.7 C)  Wt 226 lb (102.513 kg)  SpO2 96%  Physical Exam  Vitals reviewed. Constitutional: He is oriented to person, place, and time and well-developed, well-nourished, and in no distress.  HENT:  Head: Normocephalic and atraumatic.  Cardiovascular: Normal rate, regular rhythm, normal heart sounds and intact distal pulses.   Pulmonary/Chest: Effort normal and breath sounds normal. No respiratory distress. He has no wheezes. He has no rales. He exhibits no tenderness.  Musculoskeletal:       Feet:  Neurological: He is alert and oriented to person, place, and time.  Skin: Skin is warm and dry. No rash noted.  Psychiatric: Affect normal.    Recent Results (from the past 2160 hour(s))  BASIC METABOLIC PANEL      Status: Abnormal   Collection Time    12/24/13  9:05 AM      Result Value Ref Range   Sodium 141  135 - 145 mEq/L   Potassium 4.0  3.5 - 5.1 mEq/L   Chloride 109  96 - 112 mEq/L   CO2 27  19 - 32 mEq/L   Glucose, Bld 100 (*) 70 - 99 mg/dL   BUN 17  6 - 23 mg/dL   Creatinine, Ser 1.4  0.4 - 1.5 mg/dL   Calcium 9.4  8.4 - 10.5 mg/dL   GFR 54.38 (*) >60.00 mL/min  HEPATIC FUNCTION PANEL     Status: None   Collection Time    12/24/13  9:05 AM      Result Value Ref Range   Total Bilirubin 0.6  0.2 - 1.2 mg/dL   Bilirubin, Direct 0.2  0.0 - 0.3 mg/dL   Alkaline Phosphatase 57  39 - 117 U/L   AST 31  0 - 37 U/L   ALT 26  0 - 53 U/L   Total Protein 7.3  6.0 - 8.3 g/dL   Albumin 3.8  3.5 - 5.2 g/dL  LIPID PANEL     Status: None   Collection Time    12/24/13  9:05 AM      Result Value Ref Range   Cholesterol 117  0 - 200 mg/dL   Comment: ATP III Classification       Desirable:  < 200 mg/dL               Borderline High:  200 - 239 mg/dL          High:  > = 240 mg/dL   Triglycerides 108.0  0.0 - 149.0 mg/dL   Comment: Normal:  <150 mg/dLBorderline High:  150 - 199 mg/dL   HDL 46.30  >39.00 mg/dL   VLDL 21.6  0.0 - 40.0 mg/dL   LDL Cholesterol 49  0 - 99 mg/dL   Total CHOL/HDL Ratio 3     Comment:                Men          Women1/2 Average Risk     3.4          3.3Average Risk  5.0          4.42X Average Risk          9.6          7.13X Average Risk          15.0          11.0                       NonHDL 70.70    PSA     Status: None   Collection Time    12/24/13  9:05 AM      Result Value Ref Range   PSA 1.94  0.10 - 4.00 ng/mL  POCT URINALYSIS DIPSTICK     Status: None   Collection Time    12/24/13  5:12 PM      Result Value Ref Range   Color, UA yellow     Clarity, UA clear     Glucose, UA Neg     Bilirubin, UA Neg     Ketones, UA Neg     Spec Grav, UA 1.010     Blood, UA Neg     pH, UA 6.5     Protein, UA Trace     Urobilinogen, UA 0.2     Nitrite, UA Neg      Leukocytes, UA moderate (2+)    URINE CULTURE     Status: None   Collection Time    12/24/13  5:14 PM      Result Value Ref Range   Culture STAPHYLOCOCCUS SPECIES (COAGULASE NEGATIVE)     Colony Count >=100,000 COLONIES/ML     Organism ID, Bacteria STAPHYLOCOCCUS SPECIES (COAGULASE NEGATIVE)     Comment: Rifampin and Gentamicin should not be used as     single drugs for treatment of Staph infections.  POCT URINALYSIS DIPSTICK     Status: None   Collection Time    01/10/14  4:48 PM      Result Value Ref Range   Color, UA yellow     Clarity, UA clear     Glucose, UA Neg     Bilirubin, UA Neg     Ketones, UA Neg     Spec Grav, UA 1.010     Blood, UA Neg     pH, UA 6.0     Protein, UA Neg     Urobilinogen, UA 0.2     Nitrite, UA Neg     Leukocytes, UA Negative      Assessment/Plan: Great toe pain Physical exam gives little evidence of gout or pseudogout.  Will obtain x-ray of foot with attention to left great toe to r/o stress fracture or other etiology.  Will obtain uric acid level as this should be elevated after 2 weeks of symptoms if gout is cause.  Wear supportive footwear.  Rx Tramadol for pain.  Encouraged topical Aspercreme and alternating between Ice/Heat.  If workup and imaging unremarkable, will refer to specialist for further evaluation.

## 2014-02-11 NOTE — Patient Instructions (Signed)
Please obtain labs.  Then proceed to the first floor for imaging.  I will call you with your results. Please apply topical Aspercreme to the area.  Wear supportive foot wear.  Avoid overexertions.  Use Tramadol as directed for pain.  We will know more once results are in.

## 2014-02-11 NOTE — Telephone Encounter (Signed)
Pt needs appointment

## 2014-02-11 NOTE — Assessment & Plan Note (Signed)
Physical exam gives little evidence of gout or pseudogout.  Will obtain x-ray of foot with attention to left great toe to r/o stress fracture or other etiology.  Will obtain uric acid level as this should be elevated after 2 weeks of symptoms if gout is cause.  Wear supportive footwear.  Rx Tramadol for pain.  Encouraged topical Aspercreme and alternating between Ice/Heat.  If workup and imaging unremarkable, will refer to specialist for further evaluation.

## 2014-02-11 NOTE — Progress Notes (Signed)
Pre visit review using our clinic review tool, if applicable. No additional management support is needed unless otherwise documented below in the visit note. 

## 2014-02-12 ENCOUNTER — Encounter: Payer: Self-pay | Admitting: Family Medicine

## 2014-02-12 ENCOUNTER — Ambulatory Visit: Payer: 59 | Admitting: Neurology

## 2014-02-12 LAB — URIC ACID: Uric Acid, Serum: 5.8 mg/dL (ref 4.0–7.8)

## 2014-02-13 ENCOUNTER — Other Ambulatory Visit: Payer: Self-pay | Admitting: Family Medicine

## 2014-02-13 DIAGNOSIS — M79676 Pain in unspecified toe(s): Secondary | ICD-10-CM

## 2014-03-04 ENCOUNTER — Ambulatory Visit: Payer: Self-pay | Admitting: Family Medicine

## 2014-03-06 ENCOUNTER — Ambulatory Visit (INDEPENDENT_AMBULATORY_CARE_PROVIDER_SITE_OTHER): Payer: 59

## 2014-03-06 DIAGNOSIS — Z23 Encounter for immunization: Secondary | ICD-10-CM

## 2014-03-08 ENCOUNTER — Ambulatory Visit: Payer: Self-pay | Admitting: Family Medicine

## 2014-03-13 ENCOUNTER — Ambulatory Visit (INDEPENDENT_AMBULATORY_CARE_PROVIDER_SITE_OTHER): Payer: 59 | Admitting: Psychiatry

## 2014-03-13 VITALS — BP 136/75 | HR 57 | Ht 71.0 in | Wt 222.6 lb

## 2014-03-13 DIAGNOSIS — F39 Unspecified mood [affective] disorder: Secondary | ICD-10-CM

## 2014-03-13 DIAGNOSIS — F0630 Mood disorder due to known physiological condition, unspecified: Secondary | ICD-10-CM

## 2014-03-13 MED ORDER — CARBAMAZEPINE ER 100 MG PO TB12: ORAL_TABLET | ORAL | Status: DC

## 2014-03-13 MED ORDER — LORAZEPAM 0.5 MG PO TABS: 0.5000 mg | ORAL_TABLET | Freq: Two times a day (BID) | ORAL | Status: DC

## 2014-03-13 NOTE — Progress Notes (Signed)
Psychiatric Assessment Adult  Patient Identification:  William [name] Date of Evaluation:  03/13/2014 Chief Complaint: Very nervous and can't sit still and can't stop talking. This patient is a 73 year old white male who is seen with his wife [name]. This is his second marriage of 9 years. The patient was referred by Dr. Susa Raring a local neurologist because the patient seemed to be hyperverbal. His other symptoms include being restless extremely anxious and in the past being impulsive. One isolated time he acted as if he would strike his wife but has never done that. The patient and his wife are in a stable marriage. He has 2 adult children from his first marriage. His 2 grandchildren. 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 He's had endarterectomies and cataract surgery in the past. The patient denies being depressed. He does describe having significant anxiety with sweating and feelings of claustrophobia. The patient's sleep has changed over the last 3-6 months and seems to be not adequate. His appetite is good and his energy is good. There no problems thinking and concentrating and according to his wife his memory is great. He denies feelings of worthlessness. The patient has not shown any inappropriate frontal lobe symptomatology. He's not suicidal now and never has been. He denies ever being euphoric or irritable. He does share that both his mother and sister are diagnosed with bipolar disorder. The patient denies the use of alcohol or drugs. He denies ever experiencing hallucinations or paranoia. He denies ever experiencing persistent daily depression or symptoms consistent with mania. Generally the patient denies being aggressive or agitated on a persistent basis but has isolated. Or he feels that way. The patient denies symptoms consistent with generalized anxiety disorder although the past he was described as a  chronic worrier. He is no evidence of panic disorder. He denies symptoms of OCD. The patient's medical illnesses includes a right CVA in 2006, endarterectomies and cataract surgery. He recently had a bout of severe dizziness but it was suspected that he was probably dehydrated. The patient has tried a number of different psychiatric medications including high-dose Depakote which oversedated him and Lexapro which had no real effect. He also was on Abilify 10 mg for over a year but developed some oral changes/movements it was discontinued. His wife said the Abilify did help his mood became calmer and less angry. The patient shares that he has been taking Benadryl at night for sleep and at times is taking tramadol. His neurologist recently started him on Zoloft 50 mg and Aricept which has been titrated up to 10 mg. The patient takes Xanax 0.25 mg twice a day at the present time. The patient has never been a psychiatric hospital before. In fact the patient has never been evaluated by a psychiatrist. He's never been in psychotherapy. In essence only in the last 6 months has the patient seen some distinct changes in terms of being hyperverbal, restless, sweating and feeling anxious. He is not agitated. He is not suicidal or homicidal. History of Chief Complaint:  No chief complaint on file.   HPI Review of Systems Physical Exam  Depressive Symptoms: psychomotor agitation,  (Hypo) Manic Symptoms:   Elevated Mood:  No Irritable Mood:  No Grandiosity:  No Distractibility:  No Labiality of Mood:  No Delusions:  No Hallucinations:  No Impulsivity:  No Sexually Inappropriate Behavior:  No Financial Extravagance:  No Flight of  Ideas:  No  Anxiety Symptoms: Excessive Worry:  Yes Panic Symptoms:  No Agoraphobia:  No Obsessive Compulsive: No  Symptoms: None, Specific Phobias:  No Social Anxiety:  NA  Psychotic Symptoms:  Hallucinations: No None Delusions:  No Paranoia:  No   Ideas of  Reference:  No  PTSD Symptoms: Ever had a traumatic exposure:  No Had a traumatic exposure in the last month:  No Re-experiencing: No  Hypervigilance:   Hyperarousal:   Avoidance:    Traumatic Brain Injury: No  In her is triathlon yes from a cord in the first call of the is in the her is a 20 mg to 1 and 1 and is or had an eye on Elsa and right arm and rash 300 that it is an excellent  Past Psychiatric History: Diagnosis: mood disorder secondary for CVA  Hospitalizations: 0  Outpatient Care:   Substance Abuse Care:   Self-Mutilation:   Suicidal Attempts:   Violent Behaviors:    Past Medical History:   Past Medical History  Diagnosis Date  . Hyperlipidemia   . ANXIETY   . Urinary frequency   . BPH (benign prostatic hypertrophy)   . Eczema   . Bladder stone   . History of carotid artery stenosis     S/P BILATERAL CEA  . S/P carotid endarterectomy     BILATERAL ICA--  PATENT PER DUPLEX  05-19-2012  . CVD (cerebrovascular disease)   . Vitamin D deficiency   . 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 GERD (gastroesophageal reflux disease)   . Nocturia   . Stroke 1996    left side weakness  . Carotid artery occlusion    History of Loss of Consciousness:   Seizure History:  No Cardiac History:  No Allergies:   Allergies  Allergen Reactions  . Adhesive [Tape] Other (See Comments)    BLISTER   Current Medications:  Current Outpatient Prescriptions  Medication Sig Dispense Refill  . ALPRAZolam (XANAX) 0.25 MG tablet TAKE 1 TABLET BY MOUTH tid prn  60 tablet  2  . aspirin 325 MG EC tablet Take 325 mg by mouth daily.      . B Complex Vitamins (VITAMIN B COMPLEX PO) Take by mouth daily.        . Calcium Carb-Cholecalciferol (CALCIUM 1000 + D PO) Take by mouth daily.        . carbamazepine (TEGRETOL-XR) 100 MG 12 hr tablet 1 qam  1  qhs  60 tablet  2  . Cholecalciferol (VITAMIN D) 2000 UNITS CAPS Take by mouth.      . CRESTOR 40  MG tablet TAKE 1 TABLET BY MOUTH DAILY.  90 tablet  3  . diphenhydrAMINE (SOMINEX) 25 MG tablet Take 25 mg by mouth at bedtime as needed for sleep.      [name] donepezil (ARICEPT) 10 MG tablet Take 1 tablet (10 mg total) by mouth at bedtime.  30 tablet  3  . donepezil (ARICEPT) 5 MG tablet Take 1 tablet (5 mg total) by mouth at bedtime.  30 tablet  0  . esomeprazole (NEXIUM) 40 MG capsule Take 1 capsule (40 mg total) by mouth as needed.  90 capsule  3  . fenofibrate 160 MG tablet TAKE 1 TABLET BY MOUTH DAILY  90 tablet  3  . Flaxseed, Linseed, (FLAX SEED OIL PO) Take by mouth daily.        [name] LORazepam (ATIVAN) 0.5 MG tablet  Take 1 tablet (0.5 mg total) by mouth 2 (two) times daily. 1 qam 2 qhs  90 tablet  5  . Multiple Vitamin (MULTIVITAMIN) tablet Take 1 tablet by mouth daily.      . sertraline (ZOLOFT) 50 MG tablet Take 1 tablet (50 mg total) by mouth daily.  90 tablet  3  . sulfamethoxazole-trimethoprim (BACTRIM) 400-80 MG per tablet Take 1 tablet by mouth 2 (two) times daily.  14 tablet  0  . traMADol (ULTRAM) 50 MG tablet Take 1 tablet (50 mg total) by mouth 2 (two) times daily. 1-2 tabs by mouth Q8 hours, maximum 6 tabs per day.  60 tablet  0   No current facility-administered medications for this visit.    Previous Psychotropic Medications:  Medication Dose   Zoloft  50 mg   Aricept 10 mg   Xanax 0.25 mg twice a day               Substance Abuse History in the last 12 months:  Medical Consequences of Substance Abuse:   Legal Consequences of Substance Abuse:   Family Consequences of Substance Abuse:   Blackouts:   DT's:   Withdrawal Symptoms:     Social History: Current Place of Residence: [place] Place of Birth Family Members:  Marital Status:  Married Children: 2  Sons:   Daughters:  Relationships:  Education:  Dentist Problems/Performance:  Religious Beliefs/Practices:  History of Abuse:  Ship broker History:   Legal History:   Hobbies/Interests:   Family History:   Family History  Problem Relation Age of Onset  . Heart disease Mother     CHF  . Heart disease Father     CHF    Mental Status Examination/Evaluation: Objective:  Appearance: Casual  Eye Contact::  Good  Speech:  Clear and Coherent  Volume:  Normal  Mood:  nl  Affect:  Appropriate  Thought Process:  Coherent  Orientation:  Full (Time, Place, and Person)  Thought Content:  WDL  Suicidal Thoughts:  No  Homicidal Thoughts:  No  Judgement:  Good  Insight:  Good  Psychomotor Activity:  Normal  Akathisia:  No  Handed:  Right  AIMS (if indicated):    Assets:  Communication Skills    Laboratory/X-Ray Psychological Evaluation(s)        Assessment:  Axis I: Mood Disorder NOS  AXIS I Mood Disorder NOS  AXIS II   AXIS III Past Medical History  Diagnosis Date  . Hyperlipidemia   . ANXIETY   . Urinary frequency   . BPH (benign prostatic hypertrophy)   . Eczema   . Bladder stone   . History of carotid artery stenosis     S/P BILATERAL CEA  . S/P carotid endarterectomy     BILATERAL ICA--  PATENT PER DUPLEX  05-19-2012  . CVD (cerebrovascular disease)   . Vitamin D deficiency   . 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 GERD (gastroesophageal reflux disease)   . Nocturia   . Stroke 1996    left side weakness  . Carotid artery occlusion      AXIS IV other psychosocial or environmental problems  AXIS V 51-60 moderate symptoms   Treatment Plan/Recommendations:  Plan of Care:At this time we'll make a number of interventions.  The patient will discontinue the Benadryl he takes at night and discontinue the tramadol. They're also discontinue the Zoloft 50 mg. The patient continue on Aricept 10  mg. The patient will discontinue Xanax 0.25 mg twice a day. The patient will begin on Ativan 0.5 mg one in the morning one at night for one week and then increase it to one in the morning and 2 at night.  The patient begin on Tegretol 100 mg XR twice a day. I do not think this individual has a significant depression disorder nor really a manic disorder. I think this is organic in nature producing hyper verbal impulsivity restlessness and anxiety. I will therefore treat the symptoms of anxiety and impulsivity in terms of verbal activity. I will avoid antipsychotic medications as he had a reaction to Abilify likely that of tardive dyskinesia. Depakote was overly sedated so are will give him a low dose of Tegretol to start. The patient agreed to do these interventions as did his wife [name] who was with him. He'll return to see me in 2 months. They're instructed to call us if there problems. I will make an attempt to review Dr. Susa Raring his neurologist evaluation including any brain scans. The possibility of a frontal lobe disorder is not out of the question.   Laboratory:    Psychotherapy:   Med Xanax 0.25 mg twice a day, Aricept 10 mg Zoloft 50 mg at cations:   Routine PRN Medications:    Consultations:   Safety Concerns:    Other:      Haskel Schroeder, MD 9/30/20154:43 PM

## 2014-04-10 ENCOUNTER — Encounter: Payer: Self-pay | Admitting: Neurology

## 2014-04-10 ENCOUNTER — Ambulatory Visit (INDEPENDENT_AMBULATORY_CARE_PROVIDER_SITE_OTHER): Payer: 59 | Admitting: Neurology

## 2014-04-10 VITALS — BP 112/68 | HR 70 | Temp 97.7°F | Resp 16 | Wt 226.3 lb

## 2014-04-10 DIAGNOSIS — Z8673 Personal history of transient ischemic attack (TIA), and cerebral infarction without residual deficits: Secondary | ICD-10-CM

## 2014-04-10 DIAGNOSIS — G2401 Drug induced subacute dyskinesia: Secondary | ICD-10-CM

## 2014-04-10 DIAGNOSIS — R41842 Visuospatial deficit: Secondary | ICD-10-CM

## 2014-04-10 DIAGNOSIS — F0391 Unspecified dementia with behavioral disturbance: Secondary | ICD-10-CM

## 2014-04-10 DIAGNOSIS — F03918 Unspecified dementia, unspecified severity, with other behavioral disturbance: Secondary | ICD-10-CM

## 2014-04-10 MED ORDER — GALANTAMINE HYDROBROMIDE 4 MG PO TABS: 4.0000 mg | ORAL_TABLET | Freq: Two times a day (BID) | ORAL | Status: DC

## 2014-04-10 NOTE — Patient Instructions (Addendum)
1.  Stop Aricept 2.  Start galantamine 4mg .  Take 1 tablet twice daily.  At the end of the month, call us and we can refill it at a higher dose of 8mg  twice daily. 3.  Follow up in 6 months.

## 2014-04-10 NOTE — Progress Notes (Signed)
NEUROLOGY FOLLOW UP OFFICE NOTE  GERVASE PETRICCA [id number]  HISTORY OF PRESENT ILLNESS: William [name] is a 73 year old right-handed man with history of stroke, carotid artery disease, hyperlipidemia, and depression who follows for dementia.  He is accompanied by his wife.    UPDATE: He is currently taking Aricept 10mg .  Since starting medication, he has experienced diarrhea with some incontinence, as well as vivid nightmares.  He has established care with Dr. Casimiro Needle.  He is off Abilify.  HISTORY: He had a stroke in 2006, after experiencing left facial droop, left arm and leg weakness, as well slurred vision and vision problems.  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  He has some residual left sided weakness and facial weakness.  He underwent left carotid endarterectomy in 2010 for asymptomatic stenosis.  Over the past two years, he and his wife have noted a gradual progression and cognitive deficits. Onset correlated during a period when he was experiencing dizziness and syncope secondary to orthostatic hypotension. At that time, he was dehydrated and not drinking much fluids. He was instructed to start increasing his fluid intake as well as his sodium intake. Now, he tries to drink 3 bottles of water per day. He particularly notes confusion regarding how to perform certain everyday tasks. For example, it takes him a long time to unload the dishwasher because he has trouble figuring out where to put the dishes. He use to enjoy cooking and preparing meals. Now he has difficulty cooking and can only see things up in the microwave. He also has been experiencing dressing apraxia. Sometimes he will put both his feet into one leg of his pants. Other times, he has difficulty buttoning his shirts. He denies any language dysfunction such as difficulty understanding other people or other people not understanding him. He has no trouble reading or writing. His  short-term and long-term memory are pretty good. He denies any difficulty with face recognition. He denies hallucinations or delusions. He has not had any change in his personality or behavior. He still drives but very seldom and only locally. He has not had any problems with accidents or near accidents, but he says he drives slowly because he is very nervous and cautious. He does have history of anxiety and often shakes when he gets nervous. He denies any family history of dementia. However, there is a psychiatric history with his mother and sister.  He denies history of alcohol abuse or drug use.  He underwent neuropsychological testing on 10/25/13 with Dr. Macarthur Critchley at [facility] suggested mild dementia due to his stroke, but also underlying neurodegenerative process.  Memory and left hemispheric medial temporal functions were intact.  Findings showed deficits in visual processing and executive dysfunction.  Onset of visual-spatial deficits and apraxia may suggest occipital lobe involvement, such as due to an Alzheimer's variant called posterior cortical atrophy.  MRI from May 2015 revealed global atrophy but it does not seem more prominent in the occipital lobes.  12/24/13 LABS:  LDL 49,  10/16/13 LABS:  B12 720, TSH 1.668 10/19/13 MRI BRAIN WO:  stable remote large right MCA territory infarct.  Chronic microvascular ischemic changes. 05/21/13 LABS:  Hgb A1c 6 05/17/13 Carotid doppler: bilateral 1-39% stenosis of the right proximal ICA and no stenosis of the left ICA. 03/11/12 MRI Brain wo contrast:  Remote large right MCA infarct. Dementia with behavioral symptoms.  Etiology unclear.  He is exhibiting some manic symptoms Cerebrovascular  disease with history of right MCA territorial infarct Tardive dyskinesia possibly secondary to Abilify  PAST MEDICAL HISTORY: Past Medical History  Diagnosis Date  . Hyperlipidemia   . ANXIETY   . Urinary frequency   . BPH (benign prostatic  hypertrophy)   . Eczema   . Bladder stone   . History of carotid artery stenosis     S/P BILATERAL CEA  . S/P carotid endarterectomy     BILATERAL ICA--  PATENT PER DUPLEX  05-19-2012  . CVD (cerebrovascular disease)   . Vitamin D deficiency   . 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 GERD (gastroesophageal reflux disease)   . Nocturia   . Stroke 1996    left side weakness  . Carotid artery occlusion     MEDICATIONS: Current Outpatient Prescriptions on File Prior to Visit  Medication Sig Dispense Refill  . aspirin 325 MG EC tablet Take 325 mg by mouth daily.      . B Complex Vitamins (VITAMIN B COMPLEX PO) Take by mouth daily.        . Calcium Carb-Cholecalciferol (CALCIUM 1000 + D PO) Take by mouth daily.        . carbamazepine (TEGRETOL-XR) 100 MG 12 hr tablet 1 qam  1  qhs  60 tablet  2  . Cholecalciferol (VITAMIN D) 2000 UNITS CAPS Take by mouth.      . CRESTOR 40 MG tablet TAKE 1 TABLET BY MOUTH DAILY.  90 tablet  3  . donepezil (ARICEPT) 10 MG tablet Take 1 tablet (10 mg total) by mouth at bedtime.  30 tablet  3  . esomeprazole (NEXIUM) 40 MG capsule Take 1 capsule (40 mg total) by mouth as needed.  90 capsule  3  . fenofibrate 160 MG tablet TAKE 1 TABLET BY MOUTH DAILY  90 tablet  3  . Flaxseed, Linseed, (FLAX SEED OIL PO) Take by mouth daily.        [name] LORazepam (ATIVAN) 0.5 MG tablet Take 1 tablet (0.5 mg total) by mouth 2 (two) times daily. 1 qam 2 qhs  90 tablet  5  . Multiple Vitamin (MULTIVITAMIN) tablet Take 1 tablet by mouth daily.      [name] ALPRAZolam (XANAX) 0.25 MG tablet TAKE 1 TABLET BY MOUTH tid prn  60 tablet  2  . diphenhydrAMINE (SOMINEX) 25 MG tablet Take 25 mg by mouth at bedtime as needed for sleep.      [name] donepezil (ARICEPT) 5 MG tablet Take 1 tablet (5 mg total) by mouth at bedtime.  30 tablet  0  . sertraline (ZOLOFT) 50 MG tablet Take 1 tablet (50 mg total) by mouth daily.  90 tablet  3  .  sulfamethoxazole-trimethoprim (BACTRIM) 400-80 MG per tablet Take 1 tablet by mouth 2 (two) times daily.  14 tablet  0  . traMADol (ULTRAM) 50 MG tablet Take 1 tablet (50 mg total) by mouth 2 (two) times daily. 1-2 tabs by mouth Q8 hours, maximum 6 tabs per day.  60 tablet  0   No current facility-administered medications on file prior to visit.    ALLERGIES: Allergies  Allergen Reactions  . Adhesive [Tape] Other (See Comments)    BLISTER    FAMILY HISTORY: Family History  Problem Relation Age of Onset  . Heart disease Mother     CHF  . Heart disease Father     CHF    SOCIAL HISTORY: History   Social  History  . Marital Status: Married    Spouse Name: N/A    Number of Children: 2  . Years of Education: N/A   Occupational History  .     Social History Main Topics  . Smoking status: Former Smoker -- 2.00 packs/day for 40 years    Types: Cigarettes    Quit date: 02/15/2005  . Smokeless tobacco: Never Used  . Alcohol Use: 0.0 oz/week     Comment: Occasional  . Drug Use: No  . Sexual Activity: Yes    Partners: Female   Other Topics Concern  . Not on file   Social History Narrative   Exercise--  Walks dogs everyday    REVIEW OF SYSTEMS: Constitutional: No fevers, chills, or sweats, no generalized fatigue, change in appetite Eyes: No visual changes, double vision, eye pain Ear, nose and throat: No hearing loss, ear pain, nasal congestion, sore throat Cardiovascular: No chest pain, palpitations Respiratory:  No shortness of breath at rest or with exertion, wheezes GastrointestinaI: No nausea, vomiting, diarrhea, abdominal pain, fecal incontinence Genitourinary:  No dysuria, urinary retention or frequency Musculoskeletal:  No neck pain, back pain Integumentary: No rash, pruritus, skin lesions Neurological: as above Psychiatric: No depression, insomnia, anxiety Endocrine: No palpitations, fatigue, diaphoresis, mood swings, change in appetite, change in weight,  increased thirst Hematologic/Lymphatic:  No anemia, purpura, petechiae. Allergic/Immunologic: no itchy/runny eyes, nasal congestion, recent allergic reactions, rashes  PHYSICAL EXAM: Filed Vitals:   04/10/14 0752  BP: 112/68  Pulse: 70  Temp: 97.7 F (36.5 C)  Resp: 16   General: No acute distress Head:  Normocephalic/atraumatic Neck: supple, no paraspinal tenderness, full range of motion Heart:  Regular rate and rhythm Lungs:  Clear to auscultation bilaterally Back: No paraspinal tenderness Neurological Exam: alert and oriented to person, place, and time. Attention span and concentration intact, recent and remote memory intact, fund of knowledge intact.  Speech fluent and not dysarthric, language intact.  Left neglect.  Tardive dyskinesa.  CN II-XII intact. Fundi not visualized.  Bulk and tone normal, left deltoid and left hip flexion 5-/5, otherwise muscle strength 5/5 throughout.  Sensation to light touch  intact.  Deep tendon reflexes 2+ on left and 3+ on right.  Finger to nose and heel to shin testing intact.  Gait mildly unsteady.  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  Will stop Aricept 2.  Will start galantamine 4mg  twice daily.  If tolerating, then in 4 weeks will increase to 8mg  twice daily 3.  For secondary stroke prevention: Continue antiplatelet therapy. Continue statin (LDL at goal).  4.  Follow up in 6 months.  Metta Clines, DO  CC:  Garnet Koyanagi, DO

## 2014-04-12 ENCOUNTER — Telehealth: Payer: Self-pay | Admitting: *Deleted

## 2014-04-12 ENCOUNTER — Telehealth: Payer: Self-pay

## 2014-04-12 DIAGNOSIS — K5790 Diverticulosis of intestine, part unspecified, without perforation or abscess without bleeding: Secondary | ICD-10-CM

## 2014-04-12 NOTE — Telephone Encounter (Signed)
Report received from Urologist and the patient has a CT of the abdomen and had diverticulosis without diverticulitis, Dr.Lowne wants the patient to follow up with GI if he is having symptoms. Spoke with wife and she said the patient has been constipated and having abdominal pain. She said they have seen Dr.Hung in the past. I advised I would put int he referral. Marj has been made aware.     KP

## 2014-04-12 NOTE — Telephone Encounter (Signed)
Stop galantamine.  No other recommendations at this time.

## 2014-04-12 NOTE — Telephone Encounter (Signed)
Patients wife [name] called stating the Galantamine  is making the patient nausated to the point of vomiting .Please advise

## 2014-04-12 NOTE — Telephone Encounter (Signed)
Patient wife is aware that pateint should stop  Galantamine at this time

## 2014-05-08 ENCOUNTER — Ambulatory Visit (HOSPITAL_COMMUNITY): Payer: Self-pay | Admitting: Psychiatry

## 2014-05-22 ENCOUNTER — Encounter: Payer: Self-pay | Admitting: Family

## 2014-05-23 ENCOUNTER — Ambulatory Visit (INDEPENDENT_AMBULATORY_CARE_PROVIDER_SITE_OTHER): Payer: 59 | Admitting: Family

## 2014-05-23 ENCOUNTER — Ambulatory Visit (HOSPITAL_COMMUNITY)
Admission: RE | Admit: 2014-05-23 | Discharge: 2014-05-23 | Disposition: A | Discharge status: Home / Self Care | Payer: 59 | Source: Ambulatory Visit | Attending: Family | Admitting: Family

## 2014-05-23 ENCOUNTER — Encounter: Payer: Self-pay | Admitting: Family

## 2014-05-23 VITALS — BP 118/78 | HR 65 | Resp 16 | Ht 70.5 in | Wt 222.0 lb

## 2014-05-23 DIAGNOSIS — Z48812 Encounter for surgical aftercare following surgery on the circulatory system: Secondary | ICD-10-CM | POA: Insufficient documentation

## 2014-05-23 DIAGNOSIS — Z9889 Other specified postprocedural states: Secondary | ICD-10-CM

## 2014-05-23 DIAGNOSIS — I6523 Occlusion and stenosis of bilateral carotid arteries: Secondary | ICD-10-CM

## 2014-05-23 NOTE — Patient Instructions (Signed)
Stroke Prevention Some medical conditions and behaviors are associated with an increased chance of having a stroke. You may prevent a stroke by making healthy choices and managing medical conditions. HOW CAN I REDUCE MY RISK OF HAVING A STROKE?   Stay physically active. Get at least 30 minutes of activity on most or all days.  Do not smoke. It may also be helpful to avoid exposure to secondhand smoke.  Limit alcohol use. Moderate alcohol use is considered to be:  No more than 2 drinks per day for men.  No more than 1 drink per day for nonpregnant women.  Eat healthy foods. This involves:  Eating 5 or more servings of fruits and vegetables a day.  Making dietary changes that address high blood pressure (hypertension), high cholesterol, diabetes, or obesity.  Manage your cholesterol levels.  Making food choices that are high in fiber and low in saturated fat, trans fat, and cholesterol may control cholesterol levels.  Take any prescribed medicines to control cholesterol as directed by your health care provider.  Manage your diabetes.  Controlling your carbohydrate and sugar intake is recommended to manage diabetes.  Take any prescribed medicines to control diabetes as directed by your health care provider.  Control your hypertension.  Making food choices that are low in salt (sodium), saturated fat, trans fat, and cholesterol is recommended to manage hypertension.  Take any prescribed medicines to control hypertension as directed by your health care provider.  Maintain a healthy weight.  Reducing calorie intake and making food choices that are low in sodium, saturated fat, trans fat, and cholesterol are recommended to manage weight.  Stop drug abuse.  Avoid taking birth control pills.  Talk to your health care provider about the risks of taking birth control pills if you are over 35 years old, smoke, get migraines, or have ever had a blood clot.  Get evaluated for sleep  disorders (sleep apnea).  Talk to your health care provider about getting a sleep evaluation if you snore a lot or have excessive sleepiness.  Take medicines only as directed by your health care provider.  For some people, aspirin or blood thinners (anticoagulants) are helpful in reducing the risk of forming abnormal blood clots that can lead to stroke. If you have the irregular heart rhythm of atrial fibrillation, you should be on a blood thinner unless there is a good reason you cannot take them.  Understand all your medicine instructions.  Make sure that other conditions (such as anemia or atherosclerosis) are addressed. SEEK IMMEDIATE MEDICAL CARE IF:   You have sudden weakness or numbness of the face, arm, or leg, especially on one side of the body.  Your face or eyelid droops to one side.  You have sudden confusion.  You have trouble speaking (aphasia) or understanding.  You have sudden trouble seeing in one or both eyes.  You have sudden trouble walking.  You have dizziness.  You have a loss of balance or coordination.  You have a sudden, severe headache with no known cause.  You have new chest pain or an irregular heartbeat. Any of these symptoms may represent a serious problem that is an emergency. Do not wait to see if the symptoms will go away. Get medical help at once. Call your local emergency services (911 in [place] Do not drive yourself to the hospital. Document Released: 07/08/2004 Document Revised: 10/15/2013 Document Reviewed: 12/01/2012 ExitCare Patient Information 2015 [employer]. This information is not intended to replace advice given   to you by your health care provider. Make sure you discuss any questions you have with your health care provider.  

## 2014-05-23 NOTE — Progress Notes (Signed)
Established Carotid Patient   History of Present Illness  William [name] is a 73 y.o. male patient of Dr. Oneida Alar who is status post right CEA in 2006, left CEA in 2010, returns today for follow up.   Patient has Positive history of stroke in 2006, symptoms manifested by trouble getting out of bed during the night, left arm and leg weakness with slurred speech, vision was affected but wife and patient do not remember which eye, also had left facial drooping.  The patient's previous neurologic deficits are Improved, he still has left arm and leg weakness, left facial drooping. He has had no further stroke or TIA symptoms since 2006.  Pt states the chronic tongue moving habit started about June 2015, he attributes to a medication that causes dry mouth.   Pt denies claudication symptoms with walking, denies non healing wounds.  Reports New Medical or Surgical History: none  Pt Diabetic: No Pt smoker: former smoker, quit in 2006  Pt meds include: Statin : Yes ASA: Yes Other anticoagulants/antiplatelets: no  Past Medical History  Diagnosis Date  . Hyperlipidemia   . ANXIETY   . Urinary frequency   . BPH (benign prostatic hypertrophy)   . Eczema   . Bladder stone   . History of carotid artery stenosis     S/P BILATERAL CEA  . S/P carotid endarterectomy     BILATERAL ICA--  PATENT PER DUPLEX  05-19-2012  . CVD (cerebrovascular disease)   . Vitamin D deficiency   . 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 GERD (gastroesophageal reflux disease)   . Nocturia   . Stroke 1996    left side weakness  . Carotid artery occlusion   . 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 Smoking status: Former Smoker -- 2.00 packs/day for 40 years    Types: Cigarettes    Quit date: 02/15/2005  . Smokeless tobacco: Never Used  . Alcohol Use: 0.0 oz/week     Comment: Occasional    Family  History Family History  Problem Relation Age of Onset  . Heart disease Mother     CHF  . Heart disease Father     CHF    Surgical History Past Surgical History  Procedure Laterality Date  . Carotid endarterectomy Bilateral LEFT  11-12-2008  DR GREG HAYES    RIGHT ICA  2006  ([facility])  . Inguinal hernia repair Right 11-08-2006  . Cardiovascular stress test  03-27-2012  DR CRENSHAW    LOW RISK LEXISCAN STUDY-- PROBABLE NORMAL PERFUSION AND SOFT TISSUE ATTENUATION/  NO ISCHEMIA/ EF 51%  . Appendectomy  AS CHILD  . Cystoscopy with litholapaxy N/A 02/26/2013    Procedure: CYSTOSCOPY WITH LITHOLAPAXY;  Surgeon: Franchot Gallo, MD;  Location: [facility];  Service: Urology;  Laterality: N/A;  . Transurethral resection of prostate N/A 02/26/2013    Procedure: TRANSURETHRAL RESECTION OF THE PROSTATE WITH GYRUS INSTRUMENTS;  Surgeon: Franchot Gallo, MD;  Location: [facility];  Service: Urology;  Laterality: N/A;  . Prostate surgery      Allergies  Allergen Reactions  . Adhesive [Tape] Other (See Comments)    BLISTER    Current Outpatient Prescriptions  Medication Sig Dispense Refill  . aspirin 325 MG EC tablet Take 325 mg by mouth daily.    . B Complex Vitamins (VITAMIN B COMPLEX PO) Take by  mouth daily.      . Calcium Carb-Cholecalciferol (CALCIUM 1000 + D PO) Take by mouth daily.      . carbamazepine (TEGRETOL-XR) 100 MG 12 hr tablet 1 qam  1  qhs 60 tablet 2  . Cholecalciferol (VITAMIN D) 2000 UNITS CAPS Take by mouth.    . CRESTOR 40 MG tablet TAKE 1 TABLET BY MOUTH DAILY. 90 tablet 3  . diphenhydrAMINE (SOMINEX) 25 MG tablet Take 25 mg by mouth at bedtime as needed for sleep.    [name] esomeprazole (NEXIUM) 40 MG capsule Take 1 capsule (40 mg total) by mouth as needed. 90 capsule 3  . fenofibrate 160 MG tablet TAKE 1 TABLET BY MOUTH DAILY 90 tablet 3  . Flaxseed, Linseed, (FLAX SEED OIL PO) Take by mouth daily.      [name] LORazepam (ATIVAN) 0.5 MG  tablet Take 1 tablet (0.5 mg total) by mouth 2 (two) times daily. 1 qam 2 qhs 90 tablet 5  . Multiple Vitamin (MULTIVITAMIN) tablet Take 1 tablet by mouth daily.    [name] ALPRAZolam (XANAX) 0.25 MG tablet TAKE 1 TABLET BY MOUTH tid prn (Patient not taking: Reported on 05/23/2014) 60 tablet 2  . donepezil (ARICEPT) 10 MG tablet Take 1 tablet (10 mg total) by mouth at bedtime. (Patient not taking: Reported on 05/23/2014) 30 tablet 3  . galantamine (RAZADYNE) 4 MG tablet Take 1 tablet (4 mg total) by mouth 2 (two) times daily with a meal. (Patient not taking: Reported on 05/23/2014) 60 tablet 0  . sertraline (ZOLOFT) 50 MG tablet Take 1 tablet (50 mg total) by mouth daily. (Patient not taking: Reported on 05/23/2014) 90 tablet 3  . sulfamethoxazole-trimethoprim (BACTRIM) 400-80 MG per tablet Take 1 tablet by mouth 2 (two) times daily. (Patient not taking: Reported on 05/23/2014) 14 tablet 0  . traMADol (ULTRAM) 50 MG tablet Take 1 tablet (50 mg total) by mouth 2 (two) times daily. 1-2 tabs by mouth Q8 hours, maximum 6 tabs per day. (Patient not taking: Reported on 05/23/2014) 60 tablet 0   No current facility-administered medications for this visit.    Review of Systems : See HPI for pertinent positives and negatives.  Physical Examination  Filed Vitals:   05/23/14 1617 05/23/14 1621  BP: 130/69 118/78  Pulse: 64 65  Resp:  16  Height:  5' 10.5" (1.791 m)  Weight:  222 lb (100.699 kg)  SpO2:  96%   Body mass index is 31.39 kg/(m^2).   General: WDWN obese male in NAD GAIT: normal Eyes: PERRLA Pulmonary: CTAB, non labored Cardiac: regular Rhythm, no detected murmur.  VASCULAR EXAM Carotid Bruits Left Right   Negative Negative   Aorta is not palpable. Radial pulses: 2+ right, 1+ left.      LE Pulses LEFT RIGHT   POPLITEAL  palpable    palpable   POSTERIOR TIBIAL not  palpable  not  palpable    DORSALIS PEDIS  ANTERIOR TIBIAL palpable  palpable     Gastrointestinal: soft, nontender, BS WNL, no r/g, no palpable masses.  Musculoskeletal: Negative muscle atrophy/wasting. M/S 5/5 on right UE and LE, 4/5 on left UE and LE, Extremities without ischemic changes.  Neurologic: A&O X 3; Appropriate Affect, Speech is normal, tongue moving habit, CN 2-12 intact except left facial droop with smile only, left shoulder lag with shrug, Pain and light touch intact in extremities, Motor exam as listed above.   Non-Invasive Vascular Imaging CAROTID DUPLEX 05/23/2014   CEREBROVASCULAR DUPLEX EVALUATION  INDICATION: Carotid artery disease    PREVIOUS INTERVENTION(S): Right carotid endarterectomy in 2006, left carotid endarterectomy in 2010    DUPLEX EXAM: Carotid duplex    RIGHT  LEFT  Peak Systolic Velocities (cm/s) End Diastolic Velocities (cm/s) Plaque LOCATION Peak Systolic Velocities (cm/s) End Diastolic Velocities (cm/s) Plaque  84 16 - CCA PROXIMAL 90 17 -  80 21 HM CCA MID 124 22 -  116 17 HT CCA DISTAL 71 20 -  61 14 - ECA 59 8 -  37 8 HT ICA PROXIMAL 23 8 -  40 10 - ICA MID 49 18 -  49 18 - ICA DISTAL 61 20 -    N/A ICA / CCA Ratio (PSV) N/A  Antegrade Vertebral Flow Antegrade  [id number] Brachial Systolic Pressure (mmHg) [id number]  Triphasic Brachial Artery Waveforms Triphasic    Plaque Morphology:  HM = Homogeneous, HT = Heterogeneous, CP = Calcific Plaque, SP = Smooth Plaque, IP = Irregular Plaque  ADDITIONAL FINDINGS:   IMPRESSION: 1. Technically difficult exam due to constant movement. 2. Patent right carotid endarterectomy site with less than 40% internal carotid artery stenosis, hyperplasia / mild flow disturbance noted at the proximal surgical site. 3. Patent left carotid endarterectomy site with less than 40% internal carotid artery stenosis.    Compared to the previous exam:  No  change      Assessment: William [name] is a 73 y.o. male who is s/p right carotid endarterectomy in 2006, left carotid endarterectomy in 2010  He presents with asymptomatic minimal stenosis of bilateral internal carotid endarterectomy sites.  The  ICA stenosis is  Unchanged from previous exam.  Plan: Follow-up in 1 year with Carotid Duplex.   I discussed in depth with the patient the nature of atherosclerosis, and emphasized the importance of maximal medical management including strict control of blood pressure, blood glucose, and lipid levels, obtaining regular exercise, and continued cessation of smoking.  The patient is aware that without maximal medical management the underlying atherosclerotic disease process will progress, limiting the benefit of any interventions. The patient was given information about stroke prevention and what symptoms should prompt the patient to seek immediate medical care. Thank you for allowing [place] to participate in this patient's care.  Clemon Chambers, RN, MSN, FNP-C [facility] Office: [phone]  Clinic Physician: Oneida Alar  05/23/2014 4:23 PM

## 2014-05-27 ENCOUNTER — Telehealth: Payer: Self-pay | Admitting: *Deleted

## 2014-05-27 NOTE — Telephone Encounter (Signed)
Received surgical clearance form via fax from [facility]. Forwarded to Dr. Etter Sjogren. JG//CMA

## 2014-05-29 ENCOUNTER — Ambulatory Visit (INDEPENDENT_AMBULATORY_CARE_PROVIDER_SITE_OTHER): Payer: 59 | Admitting: Psychiatry

## 2014-05-29 VITALS — BP 130/90 | HR 65 | Ht 71.0 in | Wt 225.2 lb

## 2014-05-29 DIAGNOSIS — F0630 Mood disorder due to known physiological condition, unspecified: Secondary | ICD-10-CM

## 2014-05-29 DIAGNOSIS — F4322 Adjustment disorder with anxiety: Secondary | ICD-10-CM

## 2014-05-29 MED ORDER — ZOLPIDEM TARTRATE 10 MG PO TABS: 10.0000 mg | ORAL_TABLET | Freq: Every evening | ORAL | Status: DC | PRN

## 2014-05-29 NOTE — Progress Notes (Signed)
[facility] MD Progress Note  05/29/2014 4:30 PM ORISON ACY  MRN:  [id number] Subjective:  Better. Today the patient is seen with his wife [name]. She claims to changes in medications made last time his made a huge improvement the patient is much less anxious much less restless. He really is no longer hyperverbal. He was sleeping well but lately has had some difficulty falling off to sleep hence his wife started him back on some Benadryl and really hasn't worked that well. The patient denies being depressed. He is eating well. He's got good energy. He enjoys television watching football games. He is concentrating well has a good sense of worth and is not suicidal. This patient denies the use of alcohol. 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 The patient does have a number begin dense coming up. He does have some cataract surgery in the near future and apparently will have a urological procedures well at some point. He is anxious about this. The patient still is quite on edge but overall is much better. It is noted that in his last minute we discontinued his tramadol, Benadryl, Zoloft and Xanax. Presently taking Ativan 0.5 mg 1 in the morning and 2 at night and this seems to help. 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 The patient also has some drooling. His drooling seems to kind worse and is some question about it starting after the Tegretol was started. I doubt this is the cause. It should be noted the patient continues taking Ativan and is not oversedated but seems to have a distinct reduction in his anxiety which I suspect has had the biggest intervention. This patient is not suicidal. He is doing very well. Diagnosis:   DSM5: Schizophrenia Disorders:   Obsessive-Compulsive Disorders:   Trauma-Stressor Disorders:   Substance/Addictive Disorders:   Depressive Disorders:   Total Time spent  with patient:   Axis I: Adjustment Disorder with Anxiety  ADL's:  Intact  Sleep: Fair  Appetite:  Good  Suicidal Ideation:  no Homicidal Ideation:  none AEB (as evidenced by):  Psychiatric Specialty Exam: Physical Exam  ROS  Blood pressure 130/90, pulse 65, height 5\' 11"  (1.803 m), weight 225 lb 3.2 oz (102.15 kg).Body mass index is 31.42 kg/(m^2).  General Appearance: Casual  Eye Contact::  Good  Speech:  Clear and Coherent  Volume:  Normal  Mood:  Anxious  Affect:  Appropriate  Thought Process:  Coherent  Orientation:  Full (Time, Place, and Person)  Thought Content:  WDL  Suicidal Thoughts:  No  Homicidal Thoughts:  No  Memory:  NA  Judgement:  NA  Insight:  Good  Psychomotor Activity:  Normal  Concentration:  Good  Recall:  Good  Fund of Knowledge:Good  Language: Good  Akathisia:  No  Handed:  Right  AIMS (if indicated):     Assets:  Desire for Improvement  Sleep:      Musculoskeletal: Strength & Muscle Tone:  Gait & Station:  Patient leans:   Current Medications: Current Outpatient Prescriptions  Medication Sig Dispense Refill  . ALPRAZolam (XANAX) 0.25 MG tablet TAKE 1 TABLET BY MOUTH tid prn (Patient not taking: Reported on 05/23/2014) 60 tablet 2  . aspirin 325 MG EC tablet Take 325 mg by mouth daily.    . B Complex Vitamins (VITAMIN B COMPLEX PO) Take by mouth daily.      . Calcium  Carb-Cholecalciferol (CALCIUM 1000 + D PO) Take by mouth daily.      . carbamazepine (TEGRETOL-XR) 100 MG 12 hr tablet 1 qam  1  qhs 60 tablet 2  . Cholecalciferol (VITAMIN D) 2000 UNITS CAPS Take by mouth.    . CRESTOR 40 MG tablet TAKE 1 TABLET BY MOUTH DAILY. 90 tablet 3  . diphenhydrAMINE (SOMINEX) 25 MG tablet Take 25 mg by mouth at bedtime as needed for sleep.    [name] donepezil (ARICEPT) 10 MG tablet Take 1 tablet (10 mg total) by mouth at bedtime. (Patient not taking: Reported on 05/23/2014) 30 tablet 3  . esomeprazole (NEXIUM) 40 MG capsule Take 1 capsule (40 mg  total) by mouth as needed. 90 capsule 3  . fenofibrate 160 MG tablet TAKE 1 TABLET BY MOUTH DAILY 90 tablet 3  . Flaxseed, Linseed, (FLAX SEED OIL PO) Take by mouth daily.      [name] galantamine (RAZADYNE) 4 MG tablet Take 1 tablet (4 mg total) by mouth 2 (two) times daily with a meal. (Patient not taking: Reported on 05/23/2014) 60 tablet 0  . LORazepam (ATIVAN) 0.5 MG tablet Take 1 tablet (0.5 mg total) by mouth 2 (two) times daily. 1 qam 2 qhs 90 tablet 5  . Multiple Vitamin (MULTIVITAMIN) tablet Take 1 tablet by mouth daily.    . sertraline (ZOLOFT) 50 MG tablet Take 1 tablet (50 mg total) by mouth daily. (Patient not taking: Reported on 05/23/2014) 90 tablet 3  . sulfamethoxazole-trimethoprim (BACTRIM) 400-80 MG per tablet Take 1 tablet by mouth 2 (two) times daily. (Patient not taking: Reported on 05/23/2014) 14 tablet 0  . traMADol (ULTRAM) 50 MG tablet Take 1 tablet (50 mg total) by mouth 2 (two) times daily. 1-2 tabs by mouth Q8 hours, maximum 6 tabs per day. (Patient not taking: Reported on 05/23/2014) 60 tablet 0  . zolpidem (AMBIEN) 10 MG tablet Take 1 tablet (10 mg total) by mouth at bedtime as needed for sleep. 30 tablet 5   No current facility-administered medications for this visit.    Lab Results: No results found for this or any previous visit (from the past 48 hour(s)).  Physical Findings: AIMS:  , ,  ,  ,    CIWA:    COWS:     Treatment Plan Summary: At this time we have asked him to discontinue any Benadryl. They will begin on Ambien 10 mg at night for problems initiating sleep. The patient will continue taking Tegretol but will reduce the dose to just taking one at night. The patient continue Ativan 0.5 mg one in the morning and 2 at night. Overall the patient is actually doing very well. The issue trolling is still perplexing. Options are that if the patient becomes more agitated one is to return back to the full dose of Tegretol 100 mg X are twice a day. The other option is  to increase his Ativan. This patient to return to see me in approximate 7 weeks we'll reassess drolling. Hopefully that time he will be sleeping.  Plan:  Medical Decision Making Problem Points:  Established problem, worsening (2) Data Points:  Review of medication regiment & side effects (2)  I certify that inpatient services furnished can reasonably be expected to improve the patient's condition.   Jasmia Angst, [place] 05/29/2014, 4:30 PM

## 2014-06-10 ENCOUNTER — Other Ambulatory Visit (HOSPITAL_COMMUNITY): Payer: Self-pay | Admitting: Psychiatry

## 2014-06-11 ENCOUNTER — Telehealth: Payer: Self-pay | Admitting: Family Medicine

## 2014-06-11 NOTE — Telephone Encounter (Signed)
He will need an appointment.      KP

## 2014-06-11 NOTE — Telephone Encounter (Signed)
Caller name: Sharyn Lull  Relation to pt: other [facility] Call back number: [phone]   Reason for call:  Pt is having surgery Monday 06/17/13 [facility] is requesting surgical clearance.

## 2014-06-12 ENCOUNTER — Telehealth (HOSPITAL_COMMUNITY): Payer: Self-pay | Admitting: *Deleted

## 2014-06-12 DIAGNOSIS — F0630 Mood disorder due to known physiological condition, unspecified: Secondary | ICD-10-CM

## 2014-06-12 MED ORDER — CARBAMAZEPINE ER 100 MG PO TB12: ORAL_TABLET | ORAL | Status: DC

## 2014-06-12 NOTE — Telephone Encounter (Signed)
Patient's wife called and left VM: Tried decreasing Tegretol to one daily to improve excess saliva with small improvement, however, became more talkative. Wife states they went back to twice a day dose of Tegretol as excess saliva tolerated better than talking.

## 2014-06-14 HISTORY — PX: MOHS SURGERY: SUR867

## 2014-06-14 HISTORY — PX: EYE SURGERY: SHX253

## 2014-06-19 NOTE — Telephone Encounter (Signed)
Ok   With me   tegretol  Bid    Unusual to cause Saliva excess  With review  wityh patient next time

## 2014-06-26 ENCOUNTER — Ambulatory Visit: Payer: 59 | Admitting: Family Medicine

## 2014-06-27 ENCOUNTER — Ambulatory Visit (INDEPENDENT_AMBULATORY_CARE_PROVIDER_SITE_OTHER): Payer: 59 | Admitting: Family Medicine

## 2014-06-27 ENCOUNTER — Encounter: Payer: Self-pay | Admitting: Family Medicine

## 2014-06-27 VITALS — BP 124/76 | HR 63 | Temp 97.6°F | Resp 16 | Wt 228.0 lb

## 2014-06-27 DIAGNOSIS — K2100 Gastro-esophageal reflux disease with esophagitis, without bleeding: Secondary | ICD-10-CM

## 2014-06-27 DIAGNOSIS — E785 Hyperlipidemia, unspecified: Secondary | ICD-10-CM

## 2014-06-27 DIAGNOSIS — R739 Hyperglycemia, unspecified: Secondary | ICD-10-CM

## 2014-06-27 LAB — LIPID PANEL
CHOL/HDL RATIO: 3
Cholesterol: 133 mg/dL (ref 0–200)
HDL: 47.8 mg/dL (ref >39.00)
LDL Cholesterol: 67 mg/dL (ref 0–99)
NONHDL: 85.2
Triglycerides: 91 mg/dL (ref 0.0–149.0)
VLDL: 18.2 mg/dL (ref 0.0–40.0)

## 2014-06-27 LAB — BASIC METABOLIC PANEL
BUN: 21 mg/dL (ref 6–23)
CO2: 25 meq/L (ref 19–32)
Calcium: 9.2 mg/dL (ref 8.4–10.5)
Chloride: 108 mEq/L (ref 96–112)
Creatinine, Ser: 1.31 mg/dL (ref 0.40–1.50)
GFR: 57.18 mL/min — AB (ref >60.00)
GLUCOSE: 91 mg/dL (ref 70–99)
Potassium: 3.9 mEq/L (ref 3.5–5.1)
SODIUM: 140 meq/L (ref 135–145)

## 2014-06-27 LAB — HEPATIC FUNCTION PANEL
ALBUMIN: 3.9 g/dL (ref 3.5–5.2)
ALT: 29 U/L (ref 0–53)
AST: 28 U/L (ref 0–37)
Alkaline Phosphatase: 61 U/L (ref 39–117)
BILIRUBIN DIRECT: 0.1 mg/dL (ref 0.0–0.3)
BILIRUBIN TOTAL: 0.4 mg/dL (ref 0.2–1.2)
TOTAL PROTEIN: 7.6 g/dL (ref 6.0–8.3)

## 2014-06-27 LAB — HEMOGLOBIN A1C: Hgb A1c MFr Bld: 6.1 % (ref 4.6–6.5)

## 2014-06-27 MED ORDER — ESOMEPRAZOLE MAGNESIUM 40 MG PO CPDR: 40.0000 mg | DELAYED_RELEASE_CAPSULE | ORAL | Status: DC | PRN

## 2014-06-27 NOTE — Patient Instructions (Signed)
Food Choices for Gastroesophageal Reflux Disease When you have gastroesophageal reflux disease (GERD), the foods you eat and your eating habits are very important. Choosing the right foods can help ease the discomfort of GERD. WHAT GENERAL GUIDELINES DO I NEED TO FOLLOW?  Choose fruits, vegetables, whole grains, low-fat dairy products, and low-fat meat, fish, and poultry.  Limit fats such as oils, salad dressings, butter, nuts, and avocado.  Keep a food diary to identify foods that cause symptoms.  Avoid foods that cause reflux. These may be different for different people.  Eat frequent small meals instead of three large meals each day.  Eat your meals slowly, in a relaxed setting.  Limit fried foods.  Cook foods using methods other than frying.  Avoid drinking alcohol.  Avoid drinking large amounts of liquids with your meals.  Avoid bending over or lying down until 2-3 hours after eating. WHAT FOODS ARE NOT RECOMMENDED? The following are some foods and drinks that may worsen your symptoms: Vegetables Tomatoes. Tomato juice. Tomato and spaghetti sauce. Chili peppers. Onion and garlic. Horseradish. Fruits Oranges, grapefruit, and lemon (fruit and juice). Meats High-fat meats, fish, and poultry. This includes hot dogs, ribs, ham, sausage, salami, and bacon. Dairy Whole milk and chocolate milk. Sour cream. Cream. Butter. Ice cream. Cream cheese.  Beverages Coffee and tea, with or without caffeine. Carbonated beverages or energy drinks. Condiments Hot sauce. Barbecue sauce.  Sweets/Desserts Chocolate and cocoa. Donuts. Peppermint and spearmint. Fats and Oils High-fat foods, including French fries and potato chips. Other Vinegar. Strong spices, such as black pepper, white pepper, red pepper, cayenne, curry powder, cloves, ginger, and chili powder. The items listed above may not be a complete list of foods and beverages to avoid. Contact your dietitian for more  information. Document Released: 05/31/2005 Document Revised: 06/05/2013 Document Reviewed: 04/04/2013 Exit[employer] Patient Information 2015 [employer]. This information is not intended to replace advice given to you by your health care provider. Make sure you discuss any questions you have with your health care provider.  

## 2014-06-27 NOTE — Progress Notes (Signed)
    Subjective:     HPI: William [name] is a 74 y.o. male here for follow up of dyslipidemia. The patient does not use medications that may worsen dyslipidemias (corticosteroids, progestins, anabolic steroids, diuretics, beta-blockers, amiodarone, cyclosporine, olanzapine). The patient exercises intermittently. The patient is known to have coexisting coronary artery disease.    Review of Systems: Review of Systems - General ROS: negative Respiratory ROS: no cough, shortness of breath, or wheezing Cardiovascular ROS: no chest pain or dyspnea on exertion Musculoskeletal ROS: negative negative for - joint swelling      Objective:    Filed Vitals:   06/27/14 0812  BP: 124/76  Pulse: 63  Temp: 97.6 F (36.4 C)  Resp: 16    Physical Exam: General Appearance:  awake, alert, oriented, in no acute distress Neck:  neck- supple, no mass, non-tender Lungs:  Normal expansion.  Clear to auscultation.  No rales, rhonchi, or wheezing. Heart:  Heart sounds are normal.  Regular rate and rhythm without murmur, gallop or rub. Extremities: Extremities warm to touch, pink, with no edema.   Lab Review Lab Results  Component Value Date   CHOL 117 12/24/2013   CHOL 129 05/21/2013   CHOL 113 11/08/2012   HDL 46.30 12/24/2013   HDL 47.20 05/21/2013   HDL 55.70 11/08/2012       Assessment:     Problem List Items Addressed This Visit    None    Visit Diagnoses    Hyperlipidemia    -  Primary    Relevant Orders    Basic metabolic panel    Hepatic function panel    Lipid panel    Gastroesophageal reflux disease with esophagitis        Relevant Medications    esomeprazole (NEXIUM) 40 MG capsule    Hyperglycemia        Relevant Orders    Hemoglobin A1c           Plan:    The following changes are planned for the next 6 months, at which time the patient will return for repeat fasting lipids:  1. Dietary recommendations: Reduce saturated fat, "trans" monounsaturated fatty  acids, and cholesterol Increase soluble fiber 2. Exercise recommendations:  An average 40 minutes of moderate to vigorous-intensity aerobic activity 3 or 4 times per week  1. Hyperlipidemia con't crestor, check labs - Basic metabolic panel - Hepatic function panel - Lipid panel  2. Gastroesophageal reflux disease with esophagitis Pt requesting brand only-- generic did not work - esomeprazole (NEXIUM) 40 MG capsule; Take 1 capsule (40 mg total) by mouth as needed.  Dispense: 90 capsule; Refill: 3  3. Hyperglycemia Check labs - Hemoglobin A1c  Return in about 6 months (around 12/26/2014), or if symptoms worsen or fail to improve, for f/u and labs.

## 2014-06-27 NOTE — Progress Notes (Signed)
Pre visit review using our clinic review tool, if applicable. No additional management support is needed unless otherwise documented below in the visit note. 

## 2014-06-28 ENCOUNTER — Encounter: Payer: Self-pay | Admitting: *Deleted

## 2014-06-28 NOTE — Addendum Note (Signed)
Addended by: Leticia Penna A on: 06/28/2014 09:00 AM   Modules accepted: Orders

## 2014-07-31 ENCOUNTER — Ambulatory Visit (HOSPITAL_COMMUNITY): Payer: Self-pay | Admitting: Psychiatry

## 2014-09-05 ENCOUNTER — Ambulatory Visit (INDEPENDENT_AMBULATORY_CARE_PROVIDER_SITE_OTHER): Payer: 59 | Admitting: Psychiatry

## 2014-09-05 VITALS — BP 139/97 | HR 59 | Ht 71.0 in | Wt 230.8 lb

## 2014-09-05 DIAGNOSIS — F0630 Mood disorder due to known physiological condition, unspecified: Secondary | ICD-10-CM

## 2014-09-05 DIAGNOSIS — F0391 Unspecified dementia with behavioral disturbance: Secondary | ICD-10-CM

## 2014-09-05 MED ORDER — CARBAMAZEPINE ER 100 MG PO TB12: ORAL_TABLET | ORAL | Status: DC

## 2014-09-05 MED ORDER — ESZOPICLONE 3 MG PO TABS: 3.0000 mg | ORAL_TABLET | Freq: Every evening | ORAL | Status: DC | PRN

## 2014-09-05 MED ORDER — LORAZEPAM 0.5 MG PO TABS: ORAL_TABLET | ORAL | Status: DC

## 2014-09-05 NOTE — Progress Notes (Addendum)
[facility] MD Progress Note  09/05/2014 1:50 PM William [name]  MRN:  [id number] Subjective: Great Seen with wiife.  Mood good  Calm even. Still poor initiation of sleep, drooling. Not suicdal or psychotic. Wife reports over all is doing well. Principal Problem: Dementia with behavioral disturbance Diagnosis:   Patient Active Problem List   Diagnosis Date Noted  . Great toe pain [M79.676] 02/11/2014  . 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 Obesity (BMI 30-39.9) [E66.9] 06/25/2013  . Renal insufficiency [N28.9] 06/25/2013  . Weakness of left arm [R29.898] 06/25/2013  . Aftercare following surgery of the circulatory system, [place] [Z48.812] 05/17/2013  . Confusion [R41.0] 04/17/2012  . Sebaceous cyst [L72.3] 03/03/2011  . LUMBAR SPRAIN AND STRAIN [S33.5XXA] 07/31/2010  . URINARY FREQUENCY [R35.0] 09/25/2009  . RIB PAIN, LEFT SIDED [R07.9] 08/29/2009  . DEPRESSION/ANXIETY [F34.1] 08/01/2009  . 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 DIZZINESS [R42] 05/02/2009  . HEADACHE [R51] 05/02/2009  . ECZEMA, ATOPIC [L20.9] 05/31/2008  . UNSPECIFIED VITAMIN D DEFICIENCY [E55.9] 03/01/2008  . FATIGUE [R53.81, R53.83] 01/26/2008  . BENIGN PROSTATIC HYPERTROPHY, HX OF [Z87.898] 08/06/2007  . ANXIETY [F41.1] 12/21/2006  . HOARSENESS [R49.8] 12/21/2006  . FASTING HYPERGLYCEMIA [R73.09] 12/21/2006  . HYPERLIPIDEMIA [E78.5] 09/27/2006  . Unspecified Cerebral Artery Occlusion with Cerebral Infarction [I63.9] 09/27/2006   Total Time spent with patient: 30 minutes   Past Medical History:  Past Medical History  Diagnosis Date  . Hyperlipidemia   . ANXIETY   . Urinary frequency   . BPH (benign prostatic hypertrophy)   . Eczema   . Bladder stone   . History of carotid artery stenosis     S/P BILATERAL CEA  . S/P carotid endarterectomy     BILATERAL ICA--  PATENT PER DUPLEX  05-19-2012  . CVD (cerebrovascular  disease)   . Vitamin D deficiency   . 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 GERD (gastroesophageal reflux disease)   . Nocturia   . Stroke 1996    left side weakness  . Carotid artery occlusion   . 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 Carotid endarterectomy Bilateral LEFT  11-12-2008  DR GREG HAYES    RIGHT ICA  2006  ([facility])  . Inguinal hernia repair Right 11-08-2006  . Cardiovascular stress test  03-27-2012  DR CRENSHAW    LOW RISK LEXISCAN STUDY-- PROBABLE NORMAL PERFUSION AND SOFT TISSUE ATTENUATION/  NO ISCHEMIA/ EF 51%  . Appendectomy  AS CHILD  . Cystoscopy with litholapaxy N/A 02/26/2013    Procedure: CYSTOSCOPY WITH LITHOLAPAXY;  Surgeon: Franchot Gallo, MD;  Location: [facility];  Service: Urology;  Laterality: N/A;  . Transurethral resection of prostate N/A 02/26/2013    Procedure: TRANSURETHRAL RESECTION OF THE PROSTATE WITH GYRUS INSTRUMENTS;  Surgeon: Franchot Gallo, MD;  Location: [facility];  Service: Urology;  Laterality: N/A;  . Prostate surgery    . Eye surgery      cataract surgery both eyes   Family History:  Family History  Problem Relation Age of Onset  . Heart disease Mother     CHF  . Heart disease Father     CHF   Social History:  History  Alcohol Use  . 0.0 oz/week    Comment: Occasional     History  Drug  Use No    History   Social History  . Marital Status: Married    Spouse Name: N/A  . Number of Children: 2  . Years of Education: N/A   Occupational History  .     Social History Main Topics  . Smoking status: Former Smoker -- 2.00 packs/day for 40 years    Types: Cigarettes    Quit date: 02/15/2005  . Smokeless tobacco: Never Used  . Alcohol Use: 0.0 oz/week     Comment: Occasional  . Drug Use: No  . Sexual Activity:    Partners: Female   Other Topics Concern  . Not on file   Social  History Narrative   Exercise--  Engineer, manufacturing everyday   Additional History:    Sleep: Fair  Appetite:  Good   Assessment:   Musculoskeletal: Strength & Muscle Tone:  Gait & Station:  Patient leans:    Psychiatric Specialty Exam: Physical Exam  ROS  Blood pressure 139/97, pulse 59, height 5\' 11"  (1.803 m), weight 230 lb 12.8 oz (104.69 kg).Body mass index is 32.2 kg/(m^2).  General Appearance: Casual  Eye Contact::  Good  Speech:  Clear and Coherent  Volume:  Normal  Mood:  Euthymic  Affect:  Appropriate  Thought Process:  Coherent  Orientation: nl  Thought Content:  WDL  Suicidal Thoughts:  No  Homicidal Thoughts:  No  Memory:  NA  Judgement:  Good  Insight:  Fair  Psychomotor Activity:  Normal  Concentration:  Good  Recall:  Good  Fund of Knowledge:Good  Language: Fair  Akathisia:  No  Handed:  Right  AIMS (if indicated):     Assets:  Desire for Improvement  ADL's:  Intact  Cognition: WNL  Sleep:        Current Medications: Current Outpatient Prescriptions  Medication Sig Dispense Refill  . aspirin 325 MG EC tablet Take 325 mg by mouth daily.    . B Complex Vitamins (VITAMIN B COMPLEX PO) Take by mouth daily.      . Calcium Carb-Cholecalciferol (CALCIUM 1000 + D PO) Take by mouth daily.      . carbamazepine (TEGRETOL-XR) 100 MG 12 hr tablet 1 qam  1  qhs 60 tablet 8  . Cholecalciferol (VITAMIN D) 2000 UNITS CAPS Take by mouth.    . CRESTOR 40 MG tablet TAKE 1 TABLET BY MOUTH DAILY. 90 tablet 3  . donepezil (ARICEPT) 10 MG tablet Take 1 tablet (10 mg total) by mouth at bedtime. 30 tablet 3  . esomeprazole (NEXIUM) 40 MG capsule Take 1 capsule (40 mg total) by mouth as needed. 90 capsule 3  . Eszopiclone (ESZOPICLONE) 3 MG TABS Take 1 tablet (3 mg total) by mouth at bedtime as needed. Take immediately before bedtime 30 tablet 4  . fenofibrate 160 MG tablet TAKE 1 TABLET BY MOUTH DAILY 90 tablet 3  . Flaxseed, Linseed, (FLAX SEED OIL PO) Take by mouth  daily.      [name] LORazepam (ATIVAN) 0.5 MG tablet 2 bid 120 tablet 5  . Multiple Vitamin (MULTIVITAMIN) tablet Take 1 tablet by mouth daily.     No current facility-administered medications for this visit.    Lab Results: No results found for this or any previous visit (from the past 48 hour(s)).  Physical Findings: AIMS:  , ,  ,  ,    CIWA:    COWS:     Treatment Plan Summary: Change  ambien to Lunesta 3 mg  Increase ativan  1 qam  3 qhs  Go back to tegretol 100 xl bid (as wife hads already done).   Return in 10 weeks   Medical Decision Making:  Review of Psycho-Social Stressors (1)     Alban Marucci IRVING 09/05/2014, 1:50 PM +

## 2014-10-10 ENCOUNTER — Ambulatory Visit: Payer: Self-pay | Admitting: Neurology

## 2014-11-06 ENCOUNTER — Ambulatory Visit (INDEPENDENT_AMBULATORY_CARE_PROVIDER_SITE_OTHER): Payer: 59 | Admitting: Psychiatry

## 2014-11-06 ENCOUNTER — Encounter (HOSPITAL_COMMUNITY): Payer: Self-pay | Admitting: Psychiatry

## 2014-11-06 VITALS — BP 152/74 | HR 61 | Ht 70.5 in | Wt 229.8 lb

## 2014-11-06 DIAGNOSIS — F03918 Unspecified dementia, unspecified severity, with other behavioral disturbance: Secondary | ICD-10-CM

## 2014-11-06 DIAGNOSIS — F0391 Unspecified dementia with behavioral disturbance: Secondary | ICD-10-CM

## 2014-11-06 DIAGNOSIS — F0630 Mood disorder due to known physiological condition, unspecified: Secondary | ICD-10-CM

## 2014-11-06 MED ORDER — ZOLPIDEM TARTRATE 10 MG PO TABS: 10.0000 mg | ORAL_TABLET | Freq: Every evening | ORAL | Status: AC | PRN

## 2014-11-06 MED ORDER — LORAZEPAM 0.5 MG PO TABS: ORAL_TABLET | ORAL | Status: DC

## 2014-11-06 MED ORDER — CARBAMAZEPINE ER 100 MG PO TB12: ORAL_TABLET | ORAL | Status: DC

## 2014-11-06 NOTE — Progress Notes (Signed)
[facility] MD Progress Note  11/06/2014 3:41 PM William [name]  MRN:  [id number] Subjective: William [name] The patient is feeling well. He seen with his wife in both of them say that he is stable and for the most part doing fairly well. The patient denies daily depression. His wife does complains of shuffling. My observations didn't show really much shuffling. The patient rarely is not a spontaneous as his wife are members. Overall he is much improved but not back to his old self. This is a patient who had a car endarterectomy and apparently has had a number of cerebrovascular events. He is not significantly impaired neurologically in terms of his motor or sensory functioning. The patient is alert engaging and has a sense of humor. The patient is sleeping fairly well but not as well as he was when he stuck Ambien. He is eating well. He drinks no alcohol. Enjoys watching TV and is selective. The patient has 5 dogs. Overall the patient is actually doing quite well. I do think we should get a baseline Mini-Mental status exam on his next visit. The patient takes Ativan 0.5 mg 1 in the morning and 3 at night. He continues taking Tegretol which helps his irritability and his mood stability a great deal.  Principal Problem: Dementia with behavioral disturbance  Diagnosis:   Patient Active Problem List   Diagnosis Date Noted  . Great toe pain [M79.676] 02/11/2014  . 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 Obesity (BMI 30-39.9) [E66.9] 06/25/2013  . Renal insufficiency [N28.9] 06/25/2013  . Weakness of left arm [R29.898] 06/25/2013  . Aftercare following surgery of the circulatory system, [place] [Z48.812] 05/17/2013  . Confusion [R41.0] 04/17/2012  . Sebaceous cyst [L72.3] 03/03/2011  . LUMBAR SPRAIN AND STRAIN [S33.5XXA] 07/31/2010  . URINARY FREQUENCY [R35.0] 09/25/2009  . RIB PAIN, LEFT SIDED [R07.9] 08/29/2009  . DEPRESSION/ANXIETY [F34.1]  08/01/2009  . 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 DIZZINESS [R42] 05/02/2009  . HEADACHE [R51] 05/02/2009  . ECZEMA, ATOPIC [L20.9] 05/31/2008  . UNSPECIFIED VITAMIN D DEFICIENCY [E55.9] 03/01/2008  . FATIGUE [R53.81, R53.83] 01/26/2008  . BENIGN PROSTATIC HYPERTROPHY, HX OF [Z87.898] 08/06/2007  . ANXIETY [F41.1] 12/21/2006  . HOARSENESS [R49.8] 12/21/2006  . FASTING HYPERGLYCEMIA [R73.09] 12/21/2006  . HYPERLIPIDEMIA [E78.5] 09/27/2006  . Unspecified cerebral artery occlusion with cerebral infarction [I63.9] 09/27/2006   Total Time spent with patient: 30 minutes   Past Medical History:  Past Medical History  Diagnosis Date  . Hyperlipidemia   . ANXIETY   . Urinary frequency   . BPH (benign prostatic hypertrophy)   . Eczema   . Bladder stone   . History of carotid artery stenosis     S/P BILATERAL CEA  . S/P carotid endarterectomy     BILATERAL ICA--  PATENT PER DUPLEX  05-19-2012  . CVD (cerebrovascular disease)   . Vitamin D deficiency   . 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 GERD (gastroesophageal reflux disease)   . Nocturia   . Stroke 1996    left side weakness  . Carotid artery occlusion   . 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 Carotid endarterectomy Bilateral LEFT  11-12-2008  DR GREG HAYES    RIGHT ICA  2006  ([facility])  . Inguinal hernia repair Right 11-08-2006  . Cardiovascular stress  test  03-27-2012  DR CRENSHAW    LOW RISK LEXISCAN STUDY-- PROBABLE NORMAL PERFUSION AND SOFT TISSUE ATTENUATION/  NO ISCHEMIA/ EF 51%  . Appendectomy  AS CHILD  . Cystoscopy with litholapaxy N/A 02/26/2013    Procedure: CYSTOSCOPY WITH LITHOLAPAXY;  Surgeon: Franchot Gallo, MD;  Location: [facility];  Service: Urology;  Laterality: N/A;  . Transurethral resection of prostate N/A 02/26/2013    Procedure: TRANSURETHRAL RESECTION OF THE  PROSTATE WITH GYRUS INSTRUMENTS;  Surgeon: Franchot Gallo, MD;  Location: [facility];  Service: Urology;  Laterality: N/A;  . Prostate surgery    . Eye surgery      cataract surgery both eyes   Family History:  Family History  Problem Relation Age of Onset  . Heart disease Mother     CHF  . Bipolar disorder Mother   . Heart disease Father     CHF   Social History:  History  Alcohol Use  . 0.0 oz/week    Comment: Occasional     History  Drug Use No    History   Social History  . Marital Status: Married    Spouse Name: N/A  . Number of Children: 2  . Years of Education: N/A   Occupational History  .     Social History Main Topics  . Smoking status: Former Smoker -- 2.00 packs/day for 40 years    Types: Cigarettes    Quit date: 02/15/2005  . Smokeless tobacco: Never Used  . Alcohol Use: 0.0 oz/week     Comment: Occasional  . Drug Use: No  . Sexual Activity:    Partners: Female   Other Topics Concern  . None   Social History Narrative   Exercise--  Engineer, manufacturing everyday   Additional History:    Sleep: Fair  Appetite:  Good   Assessment:   Musculoskeletal: Strength & Muscle Tone:  Gait & Station:  Patient leans:   Psychiatric Specialty Exam: Physical Exam  ROS  Blood pressure 152/74, pulse 61, height 5' 10.5" (1.791 m), weight 229 lb 12.8 oz (104.237 kg).Body mass index is 32.5 kg/(m^2).  General Appearance:   Eye Contact::  Good  Speech:  Clear and Coherent  Volume:  Normal  Mood:  Euthymic  Affect:  Appropriate  Thought Process:  Coherent  Orientation:  Full (Time, Place, and Person)  Thought Content:  WDL  Suicidal Thoughts:  No  Homicidal Thoughts:  No  Memory:  NA  Judgement:  Good  Insight:  Fair  Psychomotor Activity:  Normal  Concentration:  Good  Recall:  Good  Fund of Knowledge:Fair  Language: Good  Akathisia:  No  Handed:  Right  AIMS (if indicated):     Assets:  Desire for Improvement  ADL's:  Intact   Cognition: WNL  Sleep:        Current Medications: Current Outpatient Prescriptions  Medication Sig Dispense Refill  . aspirin 325 MG EC tablet Take 325 mg by mouth daily.    . B Complex Vitamins (VITAMIN B COMPLEX PO) Take by mouth daily.      . Calcium Carb-Cholecalciferol (CALCIUM 1000 + D PO) Take by mouth daily.      . carbamazepine (TEGRETOL-XR) 100 MG 12 hr tablet 1 qam  1  qhs 60 tablet 8  . Cholecalciferol (VITAMIN D) 2000 UNITS CAPS Take by mouth.    . CRESTOR 40 MG tablet TAKE 1 TABLET BY MOUTH DAILY. 90 tablet 3  . donepezil (  ARICEPT) 10 MG tablet Take 1 tablet (10 mg total) by mouth at bedtime. 30 tablet 3  . esomeprazole (NEXIUM) 40 MG capsule Take 1 capsule (40 mg total) by mouth as needed. 90 capsule 3  . Eszopiclone (ESZOPICLONE) 3 MG TABS Take 1 tablet (3 mg total) by mouth at bedtime as needed. Take immediately before bedtime 30 tablet 4  . fenofibrate 160 MG tablet TAKE 1 TABLET BY MOUTH DAILY 90 tablet 3  . Flaxseed, Linseed, (FLAX SEED OIL PO) Take by mouth daily.      [name] LORazepam (ATIVAN) 0.5 MG tablet 2 bid 120 tablet 5  . Multiple Vitamin (MULTIVITAMIN) tablet Take 1 tablet by mouth daily.    [name] zolpidem (AMBIEN) 10 MG tablet Take 1 tablet (10 mg total) by mouth at bedtime as needed for sleep. 30 tablet 5   No current facility-administered medications for this visit.    Lab Results: No results found for this or any previous visit (from the past 48 hour(s)).  Physical Findings: AIMS:  , ,  ,  ,    CIWA:    COWS:     Treatment Plan Summary: The patient is actually doing well. He does all his basic ADLs. He wishes he could drive again but that's unlikely. The patient has sustained some degree of brain injury but I do not believe it's progressive. He is only 74 years of age. When his next visit we should do a Mini-Mental Status exam but for the time being he'll continue taking Tegretol Ativan as ordered. We will switch his Lunesta to Ambien 10 mg. The patient  is very stable at this time. He has no new specific neurological complaints or cardiovascular complaints. He sees his primary care Dr. every 6 months. This patient to return to see me in 2 months.   Medical Decision Making:  Self-Limited or Minor (1)     Katelyne Galster [place] 11/06/2014, 3:41 PM

## 2014-12-23 ENCOUNTER — Other Ambulatory Visit: Payer: Self-pay | Admitting: Family Medicine

## 2014-12-26 ENCOUNTER — Encounter: Payer: Self-pay | Admitting: Family Medicine

## 2014-12-26 ENCOUNTER — Ambulatory Visit (INDEPENDENT_AMBULATORY_CARE_PROVIDER_SITE_OTHER): Payer: 59 | Admitting: Family Medicine

## 2014-12-26 VITALS — BP 140/76 | HR 62 | Temp 98.2°F | Ht 71.0 in | Wt 230.0 lb

## 2014-12-26 DIAGNOSIS — C44310 Basal cell carcinoma of skin of unspecified parts of face: Secondary | ICD-10-CM

## 2014-12-26 DIAGNOSIS — E785 Hyperlipidemia, unspecified: Secondary | ICD-10-CM

## 2014-12-26 HISTORY — DX: Basal cell carcinoma of skin of unspecified parts of face: C44.310

## 2014-12-26 LAB — BASIC METABOLIC PANEL
BUN: 20 mg/dL (ref 6–23)
CALCIUM: 9.5 mg/dL (ref 8.4–10.5)
CHLORIDE: 106 meq/L (ref 96–112)
CO2: 29 mEq/L (ref 19–32)
Creatinine, Ser: 1.32 mg/dL (ref 0.40–1.50)
GFR: 56.6 mL/min — ABNORMAL LOW (ref >60.00)
GLUCOSE: 91 mg/dL (ref 70–99)
Potassium: 4 mEq/L (ref 3.5–5.1)
Sodium: 141 mEq/L (ref 135–145)

## 2014-12-26 LAB — LIPID PANEL
Cholesterol: 140 mg/dL (ref 0–200)
HDL: 51.2 mg/dL (ref >39.00)
LDL Cholesterol: 70 mg/dL (ref 0–99)
NonHDL: 88.8
TRIGLYCERIDES: 95 mg/dL (ref 0.0–149.0)
Total CHOL/HDL Ratio: 3
VLDL: 19 mg/dL (ref 0.0–40.0)

## 2014-12-26 LAB — HEPATIC FUNCTION PANEL
ALK PHOS: 67 U/L (ref 39–117)
ALT: 24 U/L (ref 0–53)
AST: 23 U/L (ref 0–37)
Albumin: 4 g/dL (ref 3.5–5.2)
Bilirubin, Direct: 0.1 mg/dL (ref 0.0–0.3)
TOTAL PROTEIN: 7.6 g/dL (ref 6.0–8.3)
Total Bilirubin: 0.4 mg/dL (ref 0.2–1.2)

## 2014-12-26 NOTE — Progress Notes (Signed)
Subjective:    Patient ID: William [name], male    DOB: Jun 12, 1941, 74 y.o.   MRN: [id number]  HPI  Patient here for f/u cholesterol.  Pt with no complaints.  No cp, no sob.  Pt has had surgery for [facility] and cataract surgery since last ov.    Past Medical History  Diagnosis Date  . Hyperlipidemia   . ANXIETY   . Urinary frequency   . BPH (benign prostatic hypertrophy)   . Eczema   . Bladder stone   . History of carotid artery stenosis     S/P BILATERAL CEA  . S/P carotid endarterectomy     BILATERAL ICA--  PATENT PER DUPLEX  05-19-2012  . CVD (cerebrovascular disease)   . Vitamin D deficiency   . 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 GERD (gastroesophageal reflux disease)   . Nocturia   . Stroke 1996    left side weakness  . Carotid artery occlusion   . 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  Eyes: Negative for pain, redness and visual disturbance.  Respiratory: Negative for cough, chest tightness, shortness of breath and wheezing.   Cardiovascular: Negative for chest pain, palpitations and leg swelling.  Endocrine: Negative for cold intolerance, heat intolerance, polydipsia, polyphagia and polyuria.  Genitourinary: Negative for dysuria, frequency and difficulty urinating.  Neurological: Negative for dizziness, light-headedness, numbness and headaches.    Current Outpatient Prescriptions on File Prior to Visit  Medication Sig Dispense Refill  . aspirin 325 MG EC tablet Take 325 mg by mouth daily.    . B Complex Vitamins (VITAMIN B COMPLEX PO) Take by mouth daily.      . Calcium Carb-Cholecalciferol (CALCIUM 1000 + D PO) Take by mouth daily.      . carbamazepine (TEGRETOL-XR) 100 MG 12 hr tablet 1 qam  1  qhs 60 tablet 8  . Cholecalciferol (VITAMIN D) 2000 UNITS CAPS Take by mouth.    . CRESTOR 40 MG tablet  TAKE 1 TABLET BY MOUTH DAILY. 90 tablet 3  . esomeprazole (NEXIUM) 40 MG capsule Take 1 capsule (40 mg total) by mouth as needed. 90 capsule 3  . fenofibrate 160 MG tablet Take 1 tablet (160 mg total) by mouth daily. Repeat labs are due now 90 tablet 0  . Flaxseed, Linseed, (FLAX SEED OIL PO) Take by mouth daily.      [name] LORazepam (ATIVAN) 0.5 MG tablet 2 bid 120 tablet 5  . Multiple Vitamin (MULTIVITAMIN) tablet Take 1 tablet by mouth daily.     No current facility-administered medications on file prior to visit.       Objective:    Physical Exam  Constitutional: He is oriented to person, place, and time. Vital signs are normal. He appears well-developed and well-nourished. He is sleeping.  HENT:  Head: Normocephalic and atraumatic.  Mouth/Throat: Oropharynx is clear and moist.  Eyes: EOM are normal. Pupils are equal, round, and reactive to light.  Neck: Normal range of motion. Neck supple. No thyromegaly present.  Cardiovascular: Normal rate and regular rhythm.   No murmur heard. Pulmonary/Chest: Effort normal and breath sounds normal. No respiratory distress. He has no wheezes. He has no rales. He exhibits no tenderness.  Musculoskeletal: He exhibits no edema or tenderness.  Neurological: He is alert and oriented to person, place, and  time.  Skin: Skin is warm and dry.  Psychiatric: He has a normal mood and affect. His behavior is normal. Judgment and thought content normal.    BP 140/76 mmHg  Pulse 62  Temp(Src) 98.2 F (36.8 C) (Oral)  Ht 5\' 11"  (1.803 m)  Wt 230 lb (104.327 kg)  BMI 32.09 kg/m2  SpO2 96% Wt Readings from Last 3 Encounters:  12/26/14 230 lb (104.327 kg)  11/06/14 229 lb 12.8 oz (104.237 kg)  09/05/14 230 lb 12.8 oz (104.69 kg)     Lab Results  Component Value Date   WBC 8.4 03/09/2012   HGB 11.9* 02/26/2013   HCT 39.3 03/09/2012   PLT 248.0 03/09/2012   GLUCOSE 91 06/27/2014   CHOL 133 06/27/2014   TRIG 91.0 06/27/2014   HDL 47.80 06/27/2014     LDLCALC 67 06/27/2014   ALT 29 06/27/2014   AST 28 06/27/2014   NA 140 06/27/2014   K 3.9 06/27/2014   CL 108 06/27/2014   CREATININE 1.31 06/27/2014   BUN 21 06/27/2014   CO2 25 06/27/2014   TSH 1.668 10/16/2013   PSA 1.94 12/24/2013   INR 1.0 11/08/2008   HGBA1C 6.1 06/27/2014       Assessment & Plan:   Problem List Items Addressed This Visit    Hyperlipidemia LDL goal <100    Check labs Con't crestor and fenofibrate rto 6 months or sooner prn      Basal cell carcinoma of face    Other Visit Diagnoses    Hyperlipidemia    -  Primary    Relevant Orders    Basic metabolic panel    Hepatic function panel    Lipid panel       I have discontinued Mr. Trower donepezil and Eszopiclone. I am also having him maintain his (Flaxseed, Linseed, (FLAX SEED OIL PO)), B Complex Vitamins (VITAMIN B COMPLEX PO), Calcium Carb-Cholecalciferol (CALCIUM 1000 + D PO), aspirin, multivitamin, Vitamin D, CRESTOR, esomeprazole, LORazepam, carbamazepine, fenofibrate, and zolpidem.  Meds ordered this encounter  Medications  . zolpidem (AMBIEN) 10 MG tablet    Sig: Take 10 mg by mouth at bedtime as needed for sleep.     Garnet Koyanagi, DO

## 2014-12-26 NOTE — Patient Instructions (Signed)
Cholesterol  Cholesterol is a white, waxy, fat-like substance needed by your body in small amounts. The liver makes all the cholesterol you need. Cholesterol is carried from the liver by the blood through the blood vessels. Deposits of cholesterol (plaque) may build up on blood vessel walls. These make the arteries narrower and stiffer. Cholesterol plaques increase the risk for heart attack and stroke.   You cannot feel your cholesterol level even if it is very high. The only way to know it is high is with a blood test. Once you know your cholesterol levels, you should keep a record of the test results. Work with your health care provider to keep your levels in the desired range.   WHAT DO THE RESULTS MEAN?  · Total cholesterol is a rough measure of all the cholesterol in your blood.    · LDL is the so-called bad cholesterol. This is the type that deposits cholesterol in the walls of the arteries. You want this level to be low.    · HDL is the good cholesterol because it cleans the arteries and carries the LDL away. You want this level to be high.  · Triglycerides are fat that the body can either burn for energy or store. High levels are closely linked to heart disease.    WHAT ARE THE DESIRED LEVELS OF CHOLESTEROL?  · Total cholesterol below 200.    · LDL below 100 for people at risk, below 70 for those at very high risk.    · HDL above 50 is good, above 60 is best.    · Triglycerides below 150.    HOW CAN I LOWER MY CHOLESTEROL?  · Diet. Follow your diet programs as directed by your health care provider.    ¨ Choose fish or white meat chicken and turkey, roasted or baked. Limit fatty cuts of red meat, fried foods, and processed meats, such as sausage and lunch meats.    ¨ Eat lots of fresh fruits and vegetables.  ¨ Choose whole grains, beans, pasta, potatoes, and cereals.    ¨ Use only small amounts of olive, corn, or canola oils.    ¨ Avoid butter, mayonnaise, shortening, or palm kernel oils.  ¨ Avoid foods with  trans fats.    ¨ Drink skim or nonfat milk and eat low-fat or nonfat yogurt and cheeses. Avoid whole milk, cream, ice cream, egg yolks, and full-fat cheeses.    ¨ Healthy desserts include angel food cake, ginger snaps, animal crackers, hard candy, popsicles, and low-fat or nonfat frozen yogurt. Avoid pastries, cakes, pies, and cookies.    · Exercise. Follow your exercise programs as directed by your health care provider.    ¨ A regular program helps decrease LDL and raise HDL.    ¨ A regular program helps with weight control.    ¨ Do things that increase your activity level like gardening, walking, or taking the stairs. Ask your health care provider about how you can be more active in your daily life.    · Medicine. Take medicine only as directed by your health care provider.    ¨ Medicine may be prescribed by your health care provider to help lower cholesterol and decrease the risk for heart disease.    ¨ If you have several risk factors, you may need medicine even if your levels are normal.  Document Released: 02/23/2001 Document Revised: 10/15/2013 Document Reviewed: 03/14/2013  Exit[employer]® Patient Information ©2015 [employer]. This information is not intended to replace advice given to you by your health care provider. Make sure you discuss any questions you have with your   health care provider.

## 2014-12-26 NOTE — Assessment & Plan Note (Signed)
Check labs Con't crestor and fenofibrate rto 6 months or sooner prn

## 2014-12-26 NOTE — Progress Notes (Signed)
Pre visit review using our clinic review tool, if applicable. No additional management support is needed unless otherwise documented below in the visit note. 

## 2015-01-28 ENCOUNTER — Other Ambulatory Visit: Payer: Self-pay | Admitting: Family Medicine

## 2015-01-29 ENCOUNTER — Ambulatory Visit (INDEPENDENT_AMBULATORY_CARE_PROVIDER_SITE_OTHER): Payer: PRIVATE HEALTH INSURANCE | Admitting: Psychiatry

## 2015-01-29 VITALS — BP 143/71 | HR 65 | Ht 70.5 in | Wt 233.4 lb

## 2015-01-29 DIAGNOSIS — F0391 Unspecified dementia with behavioral disturbance: Secondary | ICD-10-CM | POA: Diagnosis not present

## 2015-01-29 DIAGNOSIS — F0630 Mood disorder due to known physiological condition, unspecified: Secondary | ICD-10-CM

## 2015-01-29 DIAGNOSIS — F03918 Unspecified dementia, unspecified severity, with other behavioral disturbance: Secondary | ICD-10-CM

## 2015-01-29 MED ORDER — ZOLPIDEM TARTRATE 10 MG PO TABS: 10.0000 mg | ORAL_TABLET | Freq: Every evening | ORAL | Status: DC | PRN

## 2015-01-29 MED ORDER — CARBAMAZEPINE ER 100 MG PO TB12: ORAL_TABLET | ORAL | Status: DC

## 2015-01-29 MED ORDER — LORAZEPAM 0.5 MG PO TABS: ORAL_TABLET | ORAL | Status: DC

## 2015-01-29 NOTE — Progress Notes (Signed)
[facility] MD Progress Note  01/29/2015 3:11 PM William [name]  MRN:  [id number] Subjective: Doing great Principal Problem: Dementia with behavioral disturbance Diagnosis:  Dementia with behavioral disturbance Patient Active Problem List   Diagnosis Date Noted  . 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 Great toe pain [M79.676] 02/11/2014  . 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 Obesity (BMI 30-39.9) [E66.9] 06/25/2013  . Renal insufficiency [N28.9] 06/25/2013  . Weakness of left arm [R29.898] 06/25/2013  . Aftercare following surgery of the circulatory system, [place] [Z48.812] 05/17/2013  . Confusion [R41.0] 04/17/2012  . Sebaceous cyst [L72.3] 03/03/2011  . LUMBAR SPRAIN AND STRAIN [S33.5XXA] 07/31/2010  . URINARY FREQUENCY [R35.0] 09/25/2009  . RIB PAIN, LEFT SIDED [R07.9] 08/29/2009  . DEPRESSION/ANXIETY [F34.1] 08/01/2009  . 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 DIZZINESS [R42] 05/02/2009  . HEADACHE [R51] 05/02/2009  . ECZEMA, ATOPIC [L20.9] 05/31/2008  . UNSPECIFIED VITAMIN D DEFICIENCY [E55.9] 03/01/2008  . FATIGUE [R53.81, R53.83] 01/26/2008  . BENIGN PROSTATIC HYPERTROPHY, HX OF [Z87.898] 08/06/2007  . ANXIETY [F41.1] 12/21/2006  . HOARSENESS [R49.8] 12/21/2006  . FASTING HYPERGLYCEMIA [R73.09] 12/21/2006  . Hyperlipidemia LDL goal <100 [E78.5] 09/27/2006  . Unspecified cerebral artery occlusion with cerebral infarction [I63.9] 09/27/2006   Total Time spent with patient: 30 minutes   Past Medical History:  Past Medical History  Diagnosis Date  . Hyperlipidemia   . ANXIETY   . Urinary frequency   . BPH (benign prostatic hypertrophy)   . Eczema   . Bladder stone   . History of carotid artery stenosis     S/P BILATERAL CEA  . S/P carotid endarterectomy     BILATERAL ICA--  PATENT PER DUPLEX  05-19-2012  . CVD (cerebrovascular disease)   . Vitamin D deficiency   .  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 GERD (gastroesophageal reflux disease)   . Nocturia   . Stroke 1996    left side weakness  . Carotid artery occlusion   . 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 Carotid endarterectomy Bilateral LEFT  11-12-2008  DR GREG HAYES    RIGHT ICA  2006  ([facility])  . Inguinal hernia repair Right 11-08-2006  . Cardiovascular stress test  03-27-2012  DR CRENSHAW    LOW RISK LEXISCAN STUDY-- PROBABLE NORMAL PERFUSION AND SOFT TISSUE ATTENUATION/  NO ISCHEMIA/ EF 51%  . Appendectomy  AS CHILD  . Cystoscopy with litholapaxy N/A 02/26/2013    Procedure: CYSTOSCOPY WITH LITHOLAPAXY;  Surgeon: Franchot Gallo, MD;  Location: [facility];  Service: Urology;  Laterality: N/A;  . Transurethral resection of prostate N/A 02/26/2013    Procedure: TRANSURETHRAL RESECTION OF THE PROSTATE WITH GYRUS INSTRUMENTS;  Surgeon: Franchot Gallo, MD;  Location: [facility];  Service: Urology;  Laterality: N/A;  . Prostate surgery    . Eye surgery      cataract surgery both eyes  . Mohs surgery Left 1/ 2016    Dr Nevada Crane-- Basal cell   Family History:  Family History  Problem Relation Age of Onset  . Heart disease Mother     CHF  . Bipolar disorder Mother   . Heart disease Father     CHF   Social History:  History  Alcohol Use  . 0.0 oz/week  Comment: Occasional     History  Drug Use No    Social History   Social History  . Marital Status: Married    Spouse Name: N/A  . Number of Children: 2  . Years of Education: N/A   Occupational History  .     Social History Main Topics  . Smoking status: Former Smoker -- 2.00 packs/day for 40 years    Types: Cigarettes    Quit date: 02/15/2005  . Smokeless tobacco: Never Used  . Alcohol Use: 0.0 oz/week     Comment: Occasional  . Drug Use: No  . Sexual Activity:    Partners: Female    Other Topics Concern  . Not on file   Social History Narrative   Exercise--  Walks dogs everyday   Additional History:    Sleep: Good  Appetite:  Good   Assessment: Today the patient is seen with his wife. His wife is just retired. Overall the patient is doing very well. He's not explosive verbal. Is not depressed. He is not bored or lonely. He has no problems sleeping. His sleep cycle is off and that he goes to bed very early and sleeps a good 10 hours. He does not take naps nor does he feel sleepy. He is eating very well has good energy and can concentrate fairly well. He is no evidence of psychosis. He does not drink any alcohol or use any drugs. He is adjusting to the fact that his wife now is home all the time since she is retired. The patient takes his medicines as prescribed. The patient denies any chest pain or shortness of breath. He denies any neurological symptoms at this time. He seems very healthy. He spends a lot of time walking. His 5 dogs and spends a lot of time caring for him. The patient would like to continue his Ambien although is not short really works that well. He continues on all his other medications.  Musculoskeletal: Strength & Muscle Tone: within normal limits Gait & Station: normal Patient leans: Right   Psychiatric Specialty Exam: Physical Exam  ROS  Blood pressure 143/71, pulse 65, height 5' 10.5" (1.791 m), weight 233 lb 6.4 oz (105.87 kg).Body mass index is 33.01 kg/(m^2).  General Appearance: Negative  Eye Contact::  Good  Speech:  Clear and Coherent  Volume:  Normal  Mood:  NA  Affect:  Appropriate  Thought Process:  Coherent  Orientation:  NA  Thought Content:  WDL  Suicidal Thoughts:  No  Homicidal Thoughts:  No  Memory:  NA  Judgement:  Good  Insight:  Fair  Psychomotor Activity:  Normal  Concentration:  Good  Recall:  Good  Fund of Knowledge:Good  Language: Good  Akathisia:  No  Handed:  Right  AIMS (if indicated):     Assets:   Desire for Improvement  ADL's:  Intact  Cognition: WNL  Sleep:        Current Medications: Current Outpatient Prescriptions  Medication Sig Dispense Refill  . aspirin 325 MG EC tablet Take 325 mg by mouth daily.    . B Complex Vitamins (VITAMIN B COMPLEX PO) Take by mouth daily.      . Calcium Carb-Cholecalciferol (CALCIUM 1000 + D PO) Take by mouth daily.      . carbamazepine (TEGRETOL-XR) 100 MG 12 hr tablet 1 qam  1  qhs 60 tablet 8  . Cholecalciferol (VITAMIN D) 2000 UNITS CAPS Take by mouth.    . CRESTOR 40  MG tablet TAKE 1 TABLET BY MOUTH DAILY. 90 tablet 3  . esomeprazole (NEXIUM) 40 MG capsule Take 1 capsule (40 mg total) by mouth as needed. 90 capsule 3  . fenofibrate 160 MG tablet Take 1 tablet (160 mg total) by mouth daily. Repeat labs are due now 90 tablet 0  . Flaxseed, Linseed, (FLAX SEED OIL PO) Take by mouth daily.      [name] LORazepam (ATIVAN) 0.5 MG tablet 2 bid 120 tablet 5  . Multiple Vitamin (MULTIVITAMIN) tablet Take 1 tablet by mouth daily.    [name] zolpidem (AMBIEN) 10 MG tablet Take 1 tablet (10 mg total) by mouth at bedtime as needed for sleep. 30 tablet 5   No current facility-administered medications for this visit.    Lab Results: No results found for this or any previous visit (from the past 48 hour(s)).  Physical Findings: AIMS:  , ,  ,  ,    CIWA:    COWS:     Treatment Plan Summary: At this time the patient continue all his medications. The patient has issues with mood stability but this seems to be well-controlled at this time with the use of Tegretol. The patient continue taking Ativan for anxiety and that does seem to work fairly well. He is no neurological symptoms at this time. When his next visit we shall perform a Mini-Mental status exam. The patient is very stable and performing well.    Medical Decision Making:  Established Problem, Stable/Improving (1) and New problem, with additional work up planned     Minkler, [place] Browning 01/29/2015,  3:11 PM

## 2015-02-20 ENCOUNTER — Encounter: Payer: Self-pay | Admitting: Medical

## 2015-02-20 ENCOUNTER — Ambulatory Visit (INDEPENDENT_AMBULATORY_CARE_PROVIDER_SITE_OTHER): Payer: PPO | Admitting: Medical

## 2015-02-20 VITALS — BP 124/80 | HR 68 | Temp 98.0°F | Resp 16 | Ht 70.5 in | Wt 230.0 lb

## 2015-02-20 DIAGNOSIS — J029 Acute pharyngitis, unspecified: Secondary | ICD-10-CM

## 2015-02-20 LAB — POCT RAPID STREP A (OFFICE): Rapid Strep A Screen: POSITIVE — AB

## 2015-02-20 MED ORDER — FLUTICASONE PROPIONATE 50 MCG/ACT NA SUSP: 2.0000 | Freq: Every day | NASAL | Status: DC

## 2015-02-20 MED ORDER — AZITHROMYCIN 250 MG PO TABS: ORAL_TABLET | ORAL | Status: DC

## 2015-02-20 MED ORDER — BENZONATATE 100 MG PO CAPS: 100.0000 mg | ORAL_CAPSULE | Freq: Three times a day (TID) | ORAL | Status: DC | PRN

## 2015-02-20 NOTE — Patient Instructions (Signed)
Your strep test was positive. I am prescribing azithromycin  antibiotic. Rest hydrate, tylenol for fever and warm salt water gargles. Follow up in 7 days or as needed.  For cough benzonatate. For nasal congestion rx flonase.

## 2015-02-20 NOTE — Progress Notes (Signed)
   Subjective:    Patient ID: William [name], male    DOB: 1940/07/23, 74 y.o.   MRN: [id number]  HPI  Pt in has st. Hurts to swallow. Body aches and muscle aches. Some fever cough and congestion. Some runny nose. Symptoms since Monday. Pt was in [place] visiting grand kids.  Pt did fall this Monday. Landed on elbow and hit hip. Mild pain. Pt is going to [place] on Friday.  Review of Systems  Constitutional: Positive for fever. Negative for chills and fatigue.  HENT: Positive for sore throat.   Respiratory: Positive for cough. Negative for chest tightness, shortness of breath and wheezing.   Cardiovascular: Negative for chest pain and palpitations.  Musculoskeletal: Positive for myalgias.       Lt elbow faint pain and faint left hip pain.  Skin: Negative for rash.  Hematological: Negative for adenopathy. Does not bruise/bleed easily.  Psychiatric/Behavioral: Negative for behavioral problems and confusion.       Objective:   Physical Exam  General- No acute distress, pleasant pt.  Neck- from, No nuccal rigidity, Mild submandibular node hypertrophy.  Lungs- Clear even and unlabored.  Heart- Regular, rate and rhythm. HEENT- Head- normocephalic Eyes- PEERL bilaterally. Ears- Canals clear, normal tm's bilaterally. Nose- No frontal or maxillary sinus tenderness to palpation. Turbinates normal. Throat- posterior pharynx shows   1+  tonsillar hypertrophy plus, moderate  erythma,   No discharge.   Neurologic- CN III- XII grossly intact.      Assessment & Plan:  Your strep test was positive. I am prescribing azithromycin  antibiotic. Rest hydrate, tylenol for fever and warm salt water gargles. Follow up in 7 days or as needed.  For cough benzonatate. For nasal congestion rx flonase.

## 2015-02-20 NOTE — Progress Notes (Signed)
Pre visit review using our clinic review tool, if applicable. No additional management support is needed unless otherwise documented below in the visit note. 

## 2015-03-03 ENCOUNTER — Ambulatory Visit (INDEPENDENT_AMBULATORY_CARE_PROVIDER_SITE_OTHER): Payer: PPO | Admitting: Medical

## 2015-03-03 ENCOUNTER — Ambulatory Visit (HOSPITAL_BASED_OUTPATIENT_CLINIC_OR_DEPARTMENT_OTHER)
Admission: RE | Admit: 2015-03-03 | Discharge: 2015-03-03 | Disposition: A | Discharge status: Home / Self Care | Payer: PPO | Source: Ambulatory Visit | Attending: Medical | Admitting: Medical

## 2015-03-03 ENCOUNTER — Encounter: Payer: Self-pay | Admitting: Medical

## 2015-03-03 VITALS — BP 158/78 | HR 66 | Temp 97.8°F | Resp 16 | Ht 71.0 in | Wt 229.0 lb

## 2015-03-03 DIAGNOSIS — R05 Cough: Secondary | ICD-10-CM

## 2015-03-03 DIAGNOSIS — R059 Cough, unspecified: Secondary | ICD-10-CM

## 2015-03-03 DIAGNOSIS — R61 Generalized hyperhidrosis: Secondary | ICD-10-CM | POA: Diagnosis not present

## 2015-03-03 DIAGNOSIS — Z23 Encounter for immunization: Secondary | ICD-10-CM

## 2015-03-03 DIAGNOSIS — R0989 Other specified symptoms and signs involving the circulatory and respiratory systems: Secondary | ICD-10-CM | POA: Insufficient documentation

## 2015-03-03 MED ORDER — CEFTRIAXONE SODIUM 1 G IJ SOLR
1.0000 g | Freq: Once | INTRAMUSCULAR | Status: AC
  Administered 2015-03-03: 1 g via INTRAMUSCULAR

## 2015-03-03 NOTE — Progress Notes (Signed)
Subjective:    Patient ID: William [name], male    DOB: 07-Mar-1941, 74 y.o.   MRN: [id number]  HPI   Pt is having pnd, chest congestion and cough. When he coughs will bring up mucous(Since I saw him last visit and treated for strep). He feels like getting bronchitis. Pt does not have fevers or chills. But  some night sweats. Shirt was soaked last night. Temp of house was cool. Pt aches just a little bit. But not severe.   Sinus are not tender.  Pt treated for strep throat but not having any sore throat like strep.  Pt had mild stomach ache last night. Then Some this am. No diarrhea. Solid normal bm today. Colonoscopy done last week. No current GI symptoms.     Review of Systems  Constitutional: Positive for diaphoresis. Negative for fever and chills.  HENT: Negative for congestion, drooling, postnasal drip, sinus pressure, sore throat and tinnitus.   Respiratory: Positive for cough. Negative for chest tightness, shortness of breath and wheezing.        Some productive mucous.  Cardiovascular: Negative for chest pain and palpitations.  Neurological: Negative for dizziness and headaches.  Hematological: Negative for adenopathy. Does not bruise/bleed easily.  Psychiatric/Behavioral: Negative for behavioral problems and confusion.   Past Medical History  Diagnosis Date  . Hyperlipidemia   . ANXIETY   . Urinary frequency   . BPH (benign prostatic hypertrophy)   . Eczema   . Bladder stone   . History of carotid artery stenosis     S/P BILATERAL CEA  . S/P carotid endarterectomy     BILATERAL ICA--  PATENT PER DUPLEX  05-19-2012  . CVD (cerebrovascular disease)   . Vitamin D deficiency   . 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 GERD (gastroesophageal reflux disease)   . Nocturia   . Stroke 1996    left side weakness  . Carotid artery occlusion   . 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  Marital Status: Married    Spouse Name: N/A  . Number of Children: 2  . Years of Education: N/A   Occupational History  .     Social History Main Topics  . Smoking status: Former Smoker -- 2.00 packs/day for 40 years    Types: Cigarettes    Quit date: 02/15/2005  . Smokeless tobacco: Never Used  . Alcohol Use: 0.0 oz/week     Comment: Occasional  . Drug Use: No  . Sexual Activity:    Partners: Female   Other Topics Concern  . Not on file   Social History Narrative   Exercise--  Walks dogs everyday    Past Surgical History  Procedure Laterality Date  . Carotid endarterectomy Bilateral LEFT  11-12-2008  DR GREG HAYES    RIGHT ICA  2006  ([facility])  . Inguinal hernia repair Right 11-08-2006  . Cardiovascular stress test  03-27-2012  DR CRENSHAW    LOW RISK LEXISCAN STUDY-- PROBABLE NORMAL PERFUSION AND SOFT TISSUE ATTENUATION/  NO ISCHEMIA/ EF 51%  . Appendectomy  AS CHILD  . Cystoscopy with litholapaxy N/A 02/26/2013    Procedure: CYSTOSCOPY WITH LITHOLAPAXY;  Surgeon: Franchot Gallo, MD;  Location: [facility];  Service: Urology;  Laterality: N/A;  . Transurethral resection of prostate N/A 02/26/2013    Procedure: TRANSURETHRAL RESECTION OF THE PROSTATE WITH GYRUS  INSTRUMENTS;  Surgeon: Franchot Gallo, MD;  Location: [facility];  Service: Urology;  Laterality: N/A;  . Prostate surgery    . Eye surgery      cataract surgery both eyes  . Mohs surgery Left 1/ 2016    Dr Nevada Crane-- Basal cell    Family History  Problem Relation Age of Onset  . Heart disease Mother     CHF  . Bipolar disorder Mother   . Heart disease Father     CHF    Allergies  Allergen Reactions  . Adhesive [Tape] Other (See Comments)    BLISTER    Current Outpatient Prescriptions on File Prior to Visit  Medication Sig Dispense Refill  . aspirin 325 MG EC tablet Take 325 mg by mouth daily.    . B Complex Vitamins (VITAMIN B COMPLEX PO) Take by mouth daily.        . Calcium Carb-Cholecalciferol (CALCIUM 1000 + D PO) Take by mouth daily.      . carbamazepine (TEGRETOL-XR) 100 MG 12 hr tablet 1 qam  1  qhs 60 tablet 8  . Cholecalciferol (VITAMIN D) 2000 UNITS CAPS Take by mouth.    . CRESTOR 40 MG tablet TAKE 1 TABLET BY MOUTH DAILY. 90 tablet 3  . esomeprazole (NEXIUM) 40 MG capsule Take 1 capsule (40 mg total) by mouth as needed. 90 capsule 3  . fenofibrate 160 MG tablet Take 1 tablet (160 mg total) by mouth daily. Repeat labs are due now 90 tablet 0  . Flaxseed, Linseed, (FLAX SEED OIL PO) Take by mouth daily.      [name] LORazepam (ATIVAN) 0.5 MG tablet 2 bid 120 tablet 5  . Multiple Vitamin (MULTIVITAMIN) tablet Take 1 tablet by mouth daily.    [name] zolpidem (AMBIEN) 10 MG tablet Take 1 tablet (10 mg total) by mouth at bedtime as needed for sleep. 30 tablet 5   No current facility-administered medications on file prior to visit.    BP 158/78 mmHg  Pulse 66  Temp(Src) 97.8 F (36.6 C) (Oral)  Resp 16  Ht 5\' 11"  (1.803 m)  Wt 229 lb (103.874 kg)  BMI 31.95 kg/m2  SpO2 98%       Objective:   Physical Exam  General  Mental Status - Alert. General Appearance - Well groomed. Not in acute distress.  Skin Rashes- No Rashes.  HEENT Head- Normal. Ear Auditory Canal - Left- Normal. Right - Normal.Tympanic Membrane- Left- Normal. Right- Normal. Eye Sclera/Conjunctiva- Left- Normal. Right- Normal. Nose & Sinuses Nasal Mucosa- Left-  Mild  boggy or Congested. Right-  Mild   boggy or Congested. No sinus pressure Mouth & Throat Lips: Upper Lip- Normal: no dryness, cracking, pallor, cyanosis, or vesicular eruption. Lower Lip-Normal: no dryness, cracking, pallor, cyanosis or vesicular eruption. Buccal Mucosa- Bilateral- No Aphthous ulcers. Oropharynx- No Discharge or Erythema. Tonsils: Characteristics- Bilateral- No Erythema or Congestion. Size/Enlargement- Bilateral- No enlargement. Discharge- bilateral-None.  Neck Neck- Supple. No  Masses.   Chest and Lung Exam Auscultation: Breath Sounds:- even and unlabored, but bilateral very faint  upper lobe rhonchi.  Cardiovascular Auscultation:Rythm- Regular, rate and rhythm. Murmurs & Other Heart Sounds:Ausculatation of the heart reveal- No Murmurs.  Lymphatic Head & Neck General Head & Neck Lymphatics: Bilateral: Description- No Localized lymphadenopathy.       Assessment & Plan:  With recent cough and sweating at night, I do want to treat for bronchitis and evaluate for possible walking pneumonia. Will give rocephin im in  office and get cxr today as well. Use nyquil for cough. When results back may expand tx.  Please watch for any recurrent abdomen pain since that would direct our work up if it were to reoccur.  Follow up 7 days or as needed

## 2015-03-03 NOTE — Patient Instructions (Addendum)
With recent cough and sweating at night, I do want to treat for bronchitis and evaluate for possible walking pneumonia. Will give rocephin im in office and get cxr today as well. Use nyquil for cough. When results back may expand tx.  Please watch for any recurrent abdomen pain since that would direct our work up if it were to reoccur.  Follow up 7 days or as needed  Will do flu test today. If negative in light of medical history/chroic med problesm decided to give flu vaccine.

## 2015-03-03 NOTE — Progress Notes (Signed)
Pre visit review using our clinic review tool, if applicable. No additional management support is needed unless otherwise documented below in the visit note. 

## 2015-03-04 ENCOUNTER — Telehealth: Payer: Self-pay | Admitting: Family Medicine

## 2015-03-04 ENCOUNTER — Encounter: Payer: Self-pay | Admitting: Family Medicine

## 2015-03-04 LAB — CBC WITH DIFFERENTIAL/PLATELET
Basophils Absolute: 0.1 10*3/uL (ref 0.0–0.1)
Basophils Relative: 0.5 % (ref 0.0–3.0)
Eosinophils Absolute: 0.4 10*3/uL (ref 0.0–0.7)
Eosinophils Relative: 3.4 % (ref 0.0–5.0)
HCT: 39.4 % (ref 39.0–52.0)
HEMOGLOBIN: 13.1 g/dL (ref 13.0–17.0)
Lymphocytes Relative: 15.9 % (ref 12.0–46.0)
Lymphs Abs: 1.7 10*3/uL (ref 0.7–4.0)
MCHC: 33.3 g/dL (ref 30.0–36.0)
MCV: 91.1 fl (ref 78.0–100.0)
MONO ABS: 0.9 10*3/uL (ref 0.1–1.0)
Monocytes Relative: 8.3 % (ref 3.0–12.0)
Neutro Abs: 7.5 10*3/uL (ref 1.4–7.7)
Neutrophils Relative %: 71.9 % (ref 43.0–77.0)
Platelets: 273 10*3/uL (ref 150.0–400.0)
RBC: 4.33 Mil/uL (ref 4.22–5.81)
RDW: 13.9 % (ref 11.5–15.5)
WBC: 10.5 10*3/uL (ref 4.0–10.5)

## 2015-03-04 MED ORDER — FLUCONAZOLE 150 MG PO TABS: ORAL_TABLET | ORAL | Status: DC

## 2015-03-04 NOTE — Telephone Encounter (Signed)
Pt wife calling for results states that she has been waiting all day at home for results and said Percell Miller would call her back this morning. She is wanting Dr. Etter Sjogren to review and notify what is going on with the pt so that they can get him well. Please call [phone].

## 2015-03-04 NOTE — Telephone Encounter (Signed)
I called pt wife. She states he had bad sore throat this morning. Throat is still painful. Pt has been on zpack for strep and I gave him rocephin yesterday. Doubtful that has any residual strep. I think he may may have pnd from allergies or maybe fungal infection post antibiotic treatment. I asked wife to give him claritin otc. But no d formulation. Will call in difulcan. If by Friday any significant st then recheck him before the weekend.

## 2015-03-04 NOTE — Telephone Encounter (Signed)
Notified pt wife of his cbc and cxr results.

## 2015-03-05 ENCOUNTER — Encounter: Payer: Self-pay | Admitting: Physical Therapy

## 2015-03-05 ENCOUNTER — Ambulatory Visit: Payer: PPO | Attending: Specialist | Admitting: Physical Therapy

## 2015-03-05 DIAGNOSIS — M629 Disorder of muscle, unspecified: Secondary | ICD-10-CM | POA: Diagnosis present

## 2015-03-05 DIAGNOSIS — R262 Difficulty in walking, not elsewhere classified: Secondary | ICD-10-CM | POA: Diagnosis present

## 2015-03-05 DIAGNOSIS — M25552 Pain in left hip: Secondary | ICD-10-CM | POA: Insufficient documentation

## 2015-03-05 NOTE — Therapy (Signed)
[place], [name], [address] Phone: [phone]   Fax:  [phone]  Physical Therapy Evaluation  Patient Details  Name: William [name] MRN: [id number] Date of Birth: 12-06-1940 Referring Treasure Ingrum:  Gerrit Halls, PA-C  Encounter Date: 03/05/2015      PT End of Session - 03/05/15 1052    Visit Number 1   PT Start Time 1019   PT Stop Time 1100   PT Time Calculation (min) 41 min   Activity Tolerance Patient limited by fatigue   Behavior During Therapy [facility] for tasks assessed/performed      Past Medical History  Diagnosis Date  . Hyperlipidemia   . ANXIETY   . Urinary frequency   . BPH (benign prostatic hypertrophy)   . Eczema   . Bladder stone   . History of carotid artery stenosis     S/P BILATERAL CEA  . S/P carotid endarterectomy     BILATERAL ICA--  PATENT PER DUPLEX  05-19-2012  . CVD (cerebrovascular disease)   . Vitamin D deficiency   . 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 GERD (gastroesophageal reflux disease)   . Nocturia   . Stroke 1996    left side weakness  . Carotid artery occlusion   . 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 Carotid endarterectomy Bilateral LEFT  11-12-2008  DR GREG HAYES    RIGHT ICA  2006  ([facility])  . Inguinal hernia repair Right 11-08-2006  . Cardiovascular stress test  03-27-2012  DR CRENSHAW    LOW RISK LEXISCAN STUDY-- PROBABLE NORMAL PERFUSION AND SOFT TISSUE ATTENUATION/  NO ISCHEMIA/ EF 51%  . Appendectomy  AS CHILD  . Cystoscopy with litholapaxy N/A 02/26/2013    Procedure: CYSTOSCOPY WITH LITHOLAPAXY;  Surgeon: Franchot Gallo, MD;  Location: [facility];  Service: Urology;  Laterality: N/A;  . Transurethral resection of prostate N/A 02/26/2013    Procedure: TRANSURETHRAL RESECTION OF THE PROSTATE WITH GYRUS  INSTRUMENTS;  Surgeon: Franchot Gallo, MD;  Location: [facility];  Service: Urology;  Laterality: N/A;  . Prostate surgery    . Eye surgery      cataract surgery both eyes  . Mohs surgery Left 1/ 2016    Dr Nevada Crane-- Basal cell    There were no vitals filed for this visit.  Visit Diagnosis:  Left hip pain - Plan: PT plan of care cert/re-cert  Difficulty walking - Plan: PT plan of care cert/re-cert  Hamstring tightness of both lower extremities - Plan: PT plan of care cert/re-cert      Subjective Assessment - 03/05/15 1022    Subjective Reports a left hip strain for about 6 months.  He does report a fall about 2 weeks ago.  He wants to walk better.  X-ray negative.   Limitations Walking;House hold activities   How long can you walk comfortably? 3 minutes   Diagnostic tests x-rays negative   Patient Stated Goals have less pain, walk better and further   Currently in Pain? Yes   Pain Score 4    Pain Location Hip   Pain Orientation Left   Pain Descriptors / Indicators Aching   Pain Onset More than a month ago   Pain Frequency Intermittent   Aggravating Factors  rest, sitting   Pain Relieving Factors walking >  3 minutes   Effect of Pain on Daily Activities limits walking            [facility] PT Assessment - 03/05/15 0001    Assessment   Medical Diagnosis left hip apin   Onset Date/Surgical Date 09/02/14   Prior Therapy no   Precautions   Precautions None   Balance Screen   Has the patient fallen in the past 6 months Yes   How many times? 1   Has the patient had a decrease in activity level because of a fear of falling?  No   Is the patient reluctant to leave their home because of a fear of falling?  No   Home Environment   Additional Comments No stairs, does minimal housework and shopping   Prior Function   Level of Independence Independent   Vocation Retired   Leisure no exercise   Posture/Postural Control   Posture Comments fwd head, rounded  shoulders   Strength   Overall Strength Comments left leg 4-/5, right 4/5   Palpation   Palpation comment tender to the left GT area and into the left ITB   Ambulation/Gait   Gait Comments no assistive device, slow, shuffling gait, mild left toe out and antalgia on the left                   [facility] Adult PT Treatment/Exercise - 03/05/15 0001    Knee/Hip Exercises: Aerobic   Nustep level 3 x 5 minutes                PT Education - 03/05/15 1038    Education provided Yes   Education Details LE flexibility   Person(s) Educated Patient;Spouse   Methods Explanation;Demonstration;Handout   Comprehension Verbalized understanding          PT Short Term Goals - 03/05/15 1055    PT SHORT TERM GOAL #1   Title independent with initial HEP   Time 2   Period Weeks   Status New           PT Long Term Goals - 03/05/15 1056    PT LONG TERM GOAL #1   Title Understand fall risks, the importance of exercise and flexibility to health and function   Period Weeks   Status New   PT LONG TERM GOAL #2   Title increase SLR to 65 degrees   Baseline at evaluation the SLR was 40 degrees due to tightness bilateraly   Time 8   Period Weeks   Status New   PT LONG TERM GOAL #3   Title walk around our building without rest    Time 8   Period Weeks   Status New   PT LONG TERM GOAL #4   Title decrease pain with walking by 50%   Time 8   Period Weeks   Status New   PT LONG TERM GOAL #5   Title increase lumbar ROM 25%   Time 8   Period Weeks   Status New               Plan - 03/05/15 1053    Clinical Impression Statement Patient has a history of stroke with some residual weakness of the left LE.  Reports that he is very deconditioned.  Reports that over the past few months he has had increased pain int he left hip.  He reports that he is unable to walk the dog or walk around the block due to pain in  the hip.  He is very tight in the LE's   Pt will benefit from  skilled therapeutic intervention in order to improve on the following deficits Abnormal gait;Decreased activity tolerance;Decreased balance;Decreased mobility;Decreased endurance;Decreased range of motion;Decreased strength;Difficulty walking;Increased muscle spasms;Impaired flexibility;Postural dysfunction;Pain   Rehab Potential Good   PT Frequency 2x / week   PT Duration 8 weeks   PT Treatment/Interventions Electrical Stimulation;Moist Heat;Therapeutic exercise;Functional mobility training;Gait training;Ultrasound;Iontophoresis 4mg /ml Dexamethasone;Manual techniques;Patient/family education   PT Next Visit Plan may need to get a baseline for TUG or Berg, but needs flexibility the most then endurance and strength   Consulted and Agree with Plan of Care Patient          G-Codes - 03-19-15 1153    Functional Assessment Tool Used foto   Functional Limitation Mobility: Walking and moving around   Mobility: Walking and Moving Around Current Status [id number]) At least 60 percent but less than 80 percent impaired, limited or restricted   Mobility: Walking and Moving Around Goal Status [phone]) At least 40 percent but less than 60 percent impaired, limited or restricted       Problem List Patient Active Problem List   Diagnosis Date Noted  . 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  . Great toe pain 02/11/2014  . Dementia with behavioral disturbance 01/09/2014  . History of right MCA stroke 11/09/2013  . Obesity (BMI 30-39.9) 06/25/2013  . Renal insufficiency 06/25/2013  . Weakness of left arm 06/25/2013  . Aftercare following surgery of the circulatory system, [place] 05/17/2013  . Confusion 04/17/2012  . Sebaceous cyst 03/03/2011  . LUMBAR SPRAIN AND STRAIN 07/31/2010  . URINARY FREQUENCY 09/25/2009  . RIB PAIN, LEFT SIDED 08/29/2009  . DEPRESSION/ANXIETY 08/01/2009  . CAROTID ARTERY STENOSIS, BILATERAL 05/02/2009  . DIZZINESS 05/02/2009  . HEADACHE 05/02/2009  . ECZEMA, ATOPIC 05/31/2008   . UNSPECIFIED VITAMIN D DEFICIENCY 03/01/2008  . FATIGUE 01/26/2008  . BENIGN PROSTATIC HYPERTROPHY, HX OF 08/06/2007  . ANXIETY 12/21/2006  . HOARSENESS 12/21/2006  . FASTING HYPERGLYCEMIA 12/21/2006  . Hyperlipidemia LDL goal <100 09/27/2006  . Unspecified cerebral artery occlusion with cerebral infarction 09/27/2006    Sumner Boast., PT 03/19/15, 11:55 AM  [address] [address] [place], [name], [address] Phone: [phone]   Fax:  [phone]

## 2015-03-05 NOTE — Patient Instructions (Signed)
.  mike

## 2015-03-07 ENCOUNTER — Ambulatory Visit: Payer: PPO | Admitting: Physical Therapy

## 2015-03-07 DIAGNOSIS — M629 Disorder of muscle, unspecified: Secondary | ICD-10-CM

## 2015-03-07 DIAGNOSIS — M25552 Pain in left hip: Secondary | ICD-10-CM | POA: Diagnosis not present

## 2015-03-07 DIAGNOSIS — R262 Difficulty in walking, not elsewhere classified: Secondary | ICD-10-CM

## 2015-03-07 NOTE — Therapy (Signed)
[place], [name], [address] Phone: [phone]   Fax:  [phone]  Physical Therapy Treatment  Patient Details  Name: William [name] MRN: [id number] Date of Birth: 04/08/1941 Referring Provider:  Sydnee Cabal, MD  Encounter Date: 03/07/2015      PT End of Session - 03/07/15 1151    Visit Number 2   PT Start Time [id number]   PT Stop Time 1100   PT Time Calculation (min) 45 min   Activity Tolerance Patient limited by fatigue   Behavior During Therapy 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 deficits     2006   GERD (gastroesophageal reflux disease)    Nocturia    Stroke 1996    left side weakness   Carotid artery occlusion    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    Past Surgical History  Procedure Laterality Date   Carotid endarterectomy Bilateral LEFT  11-12-2008  DR GREG HAYES    RIGHT ICA  2006  ([facility])   Inguinal hernia repair Right 11-08-2006   Cardiovascular stress test  03-27-2012  DR CRENSHAW    LOW RISK LEXISCAN STUDY-- PROBABLE NORMAL PERFUSION AND SOFT TISSUE ATTENUATION/  NO ISCHEMIA/ EF 51%   Appendectomy  AS CHILD   Cystoscopy with litholapaxy N/A 02/26/2013    Procedure: CYSTOSCOPY WITH LITHOLAPAXY;  Surgeon: Franchot Gallo, MD;  Location: [facility];  Service: Urology;  Laterality: N/A;   Transurethral resection of prostate N/A 02/26/2013    Procedure: TRANSURETHRAL RESECTION OF THE PROSTATE WITH GYRUS INSTRUMENTS;   Surgeon: Franchot Gallo, MD;  Location: [facility];  Service: Urology;  Laterality: N/A;   Prostate surgery     Eye surgery      cataract surgery both eyes   Mohs surgery Left 1/ 2016    Dr Nevada Crane-- Basal cell    There were no vitals filed for this visit.  Visit Diagnosis:  Left hip pain  Difficulty walking  Hamstring tightness of both lower extremities      Subjective Assessment - 03/07/15 1150    Subjective Reports some Left hip and glute pain/ soreness after doing his HEP.   Currently in Pain? Yes   Pain Location Hip   Pain Orientation Left   Pain Descriptors / Indicators Aching;Sore                         [facility] Adult PT Treatment/Exercise - 03/07/15 0001    Exercises   Exercises Lumbar;Knee/Hip   Lumbar Exercises: Stretches   Passive Hamstring Stretch 3 reps;30 seconds   Single Knee to Chest Stretch 5 reps;10 seconds   Double Knee to Chest Stretch 5 reps;10 seconds   Lower Trunk Rotation 5 reps;10 seconds   Pelvic Tilt 5 reps;10 seconds   Lumbar Exercises: Supine   Ab Set 10 reps;5 seconds   Bent Knee Raise 10  reps;2 seconds   Bent Knee Raise Limitations quick to fatigue   Knee/Hip Exercises: Aerobic   Nustep level 3 x 5 minutes   Knee/Hip Exercises: Seated   Marching Strengthening;2 sets;10 reps;Weights   Marching Weights 5 lbs.   Hamstring Curl Strengthening;Both;2 sets;10 reps   Hamstring Limitations red t band                  PT Short Term Goals - 03/05/15 1055    PT SHORT TERM GOAL #1   Title independent with initial HEP   Time 2   Period Weeks   Status New           PT Long Term Goals - 03/05/15 1056    PT LONG TERM GOAL #1   Title Understand fall risks, the importance of exercise and flexibility to health and function   Period Weeks   Status New   PT LONG TERM GOAL #2   Title increase SLR to 65 degrees   Baseline at evaluation the SLR was 40 degrees due to tightness bilateraly   Time 8    Period Weeks   Status New   PT LONG TERM GOAL #3   Title walk around our building without rest    Time 8   Period Weeks   Status New   PT LONG TERM GOAL #4   Title decrease pain with walking by 50%   Time 8   Period Weeks   Status New   PT LONG TERM GOAL #5   Title increase lumbar ROM 25%   Time 8   Period Weeks   Status New               Plan - 03/07/15 1152    Clinical Impression Statement Fatigues quickly.  Frank core and LE weakness.  Decreased LE flexibility throughout   Pt will benefit from skilled therapeutic intervention in order to improve on the following deficits Abnormal gait;Decreased activity tolerance;Decreased balance;Decreased mobility;Decreased endurance;Decreased range of motion;Decreased strength;Difficulty walking;Increased muscle spasms;Impaired flexibility;Postural dysfunction;Pain        Problem List Patient Active Problem List   Diagnosis Date Noted   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   Great toe pain 02/11/2014   Dementia with behavioral disturbance 01/09/2014   History of right MCA stroke 11/09/2013   Obesity (BMI 30-39.9) 06/25/2013   Renal insufficiency 06/25/2013   Weakness of left arm 06/25/2013   Aftercare following surgery of the circulatory system, [place] 05/17/2013   Confusion 04/17/2012   Sebaceous cyst 03/03/2011   LUMBAR SPRAIN AND STRAIN 07/31/2010   URINARY FREQUENCY 09/25/2009   RIB PAIN, LEFT SIDED 08/29/2009   DEPRESSION/ANXIETY 08/01/2009   CAROTID ARTERY STENOSIS, BILATERAL 05/02/2009   DIZZINESS 05/02/2009   HEADACHE 05/02/2009   ECZEMA, ATOPIC 05/31/2008   UNSPECIFIED VITAMIN D DEFICIENCY 03/01/2008   FATIGUE 01/26/2008   BENIGN PROSTATIC HYPERTROPHY, HX OF 08/06/2007   ANXIETY 12/21/2006   HOARSENESS 12/21/2006   FASTING HYPERGLYCEMIA 12/21/2006   Hyperlipidemia LDL goal <100 09/27/2006   Unspecified cerebral artery occlusion with cerebral infarction 09/27/2006    Olean Ree, PTA 03/07/2015, 11:55 AM  [address] [place], [name], [address] Phone: [phone]   Fax:  [phone]

## 2015-03-11 ENCOUNTER — Encounter: Payer: Self-pay | Admitting: Physical Therapy

## 2015-03-11 ENCOUNTER — Ambulatory Visit: Payer: PPO | Admitting: Physical Therapy

## 2015-03-11 DIAGNOSIS — M629 Disorder of muscle, unspecified: Secondary | ICD-10-CM

## 2015-03-11 DIAGNOSIS — R262 Difficulty in walking, not elsewhere classified: Secondary | ICD-10-CM

## 2015-03-11 DIAGNOSIS — M25552 Pain in left hip: Secondary | ICD-10-CM | POA: Diagnosis not present

## 2015-03-11 NOTE — Therapy (Signed)
[place], [name], [address] Phone: [phone]   Fax:  [phone]  Physical Therapy Treatment  Patient Details  Name: William [name] MRN: [id number] Date of Birth: 1940/07/13 Referring Provider:  Sydnee Cabal, MD  Encounter Date: 03/11/2015      PT End of Session - 03/11/15 1046    Visit Number 3   PT Start Time 0933   PT Stop Time 1030   PT Time Calculation (min) 57 min   Activity Tolerance Patient limited by fatigue   Behavior During Therapy [facility] for tasks assessed/performed      Past Medical History  Diagnosis Date  . Hyperlipidemia   . ANXIETY   . Urinary frequency   . BPH (benign prostatic hypertrophy)   . Eczema   . Bladder stone   . History of carotid artery stenosis     S/P BILATERAL CEA  . S/P carotid endarterectomy     BILATERAL ICA--  PATENT PER DUPLEX  05-19-2012  . CVD (cerebrovascular disease)   . Vitamin D deficiency   . 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 GERD (gastroesophageal reflux disease)   . Nocturia   . Stroke 1996    left side weakness  . Carotid artery occlusion   . 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 Carotid endarterectomy Bilateral LEFT  11-12-2008  DR GREG HAYES    RIGHT ICA  2006  ([facility])  . Inguinal hernia repair Right 11-08-2006  . Cardiovascular stress test  03-27-2012  DR CRENSHAW    LOW RISK LEXISCAN STUDY-- PROBABLE NORMAL PERFUSION AND SOFT TISSUE ATTENUATION/  NO ISCHEMIA/ EF 51%  . Appendectomy  AS CHILD  . Cystoscopy with litholapaxy N/A 02/26/2013    Procedure: CYSTOSCOPY WITH LITHOLAPAXY;  Surgeon: Franchot Gallo, MD;  Location: [facility];  Service: Urology;  Laterality: N/A;  . Transurethral resection of prostate N/A 02/26/2013    Procedure: TRANSURETHRAL RESECTION OF THE PROSTATE WITH GYRUS INSTRUMENTS;   Surgeon: Franchot Gallo, MD;  Location: [facility];  Service: Urology;  Laterality: N/A;  . Prostate surgery    . Eye surgery      cataract surgery both eyes  . Mohs surgery Left 1/ 2016    Dr Nevada Crane-- Basal cell    There were no vitals filed for this visit.  Visit Diagnosis:  Left hip pain  Difficulty walking  Hamstring tightness of both lower extremities      Subjective Assessment - 03/11/15 0935    Subjective I was tired after last treatment, but not very sore.   Currently in Pain? No/denies                         [facility] Adult PT Treatment/Exercise - 03/11/15 1045    Lumbar Exercises: Stretches   Piriformis Stretch 3 reps;20 seconds   Lumbar Exercises: Aerobic   UBE (Upper Arm Bike) level 5 x 5 minutes   Modalities   Modalities Electrical Stimulation;Moist Heat   Moist Heat Therapy   Number Minutes Moist Heat 15 Minutes   Moist Heat Location Lumbar Spine   Electrical Stimulation   Electrical Stimulation Location lumbar    Electrical Stimulation Action IFC   Electrical Stimulation Parameters tolerance   Electrical Stimulation Goals Pain  PT Short Term Goals - 03/05/15 1055    PT SHORT TERM GOAL #1   Title independent with initial HEP   Time 2   Period Weeks   Status New           PT Long Term Goals - 03/05/15 1056    PT LONG TERM GOAL #1   Title Understand fall risks, the importance of exercise and flexibility to health and function   Period Weeks   Status New   PT LONG TERM GOAL #2   Title increase SLR to 65 degrees   Baseline at evaluation the SLR was 40 degrees due to tightness bilateraly   Time 8   Period Weeks   Status New   PT LONG TERM GOAL #3   Title walk around our building without rest    Time 8   Period Weeks   Status New   PT LONG TERM GOAL #4   Title decrease pain with walking by 50%   Time 8   Period Weeks   Status New   PT LONG TERM GOAL #5   Title increase lumbar  ROM 25%   Time 8   Period Weeks   Status New               Plan - 03/11/15 1047    Clinical Impression Statement Reports some soreness in the Low back with exercises.  Fatigues easily with the walking, very weak in the quads, had difficulty doing 5# knee extensions   PT Next Visit Plan may need to get a baseline for TUG or Berg, but needs flexibility the most then endurance and strength   Consulted and Agree with Plan of Care Patient        Problem List Patient Active Problem List   Diagnosis Date Noted  . 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  . Great toe pain 02/11/2014  . Dementia with behavioral disturbance 01/09/2014  . History of right MCA stroke 11/09/2013  . Obesity (BMI 30-39.9) 06/25/2013  . Renal insufficiency 06/25/2013  . Weakness of left arm 06/25/2013  . Aftercare following surgery of the circulatory system, [place] 05/17/2013  . Confusion 04/17/2012  . Sebaceous cyst 03/03/2011  . LUMBAR SPRAIN AND STRAIN 07/31/2010  . URINARY FREQUENCY 09/25/2009  . RIB PAIN, LEFT SIDED 08/29/2009  . DEPRESSION/ANXIETY 08/01/2009  . CAROTID ARTERY STENOSIS, BILATERAL 05/02/2009  . DIZZINESS 05/02/2009  . HEADACHE 05/02/2009  . ECZEMA, ATOPIC 05/31/2008  . UNSPECIFIED VITAMIN D DEFICIENCY 03/01/2008  . FATIGUE 01/26/2008  . BENIGN PROSTATIC HYPERTROPHY, HX OF 08/06/2007  . ANXIETY 12/21/2006  . HOARSENESS 12/21/2006  . FASTING HYPERGLYCEMIA 12/21/2006  . Hyperlipidemia LDL goal <100 09/27/2006  . Unspecified cerebral artery occlusion with cerebral infarction 09/27/2006    Sumner Boast., PT 03/11/2015, 10:48 AM  [place], [name], [address] Phone: [phone]   Fax:  [phone]

## 2015-03-14 ENCOUNTER — Encounter: Payer: Self-pay | Admitting: Physical Therapy

## 2015-03-14 ENCOUNTER — Ambulatory Visit: Payer: PPO | Admitting: Physical Therapy

## 2015-03-14 DIAGNOSIS — R262 Difficulty in walking, not elsewhere classified: Secondary | ICD-10-CM

## 2015-03-14 DIAGNOSIS — M25552 Pain in left hip: Secondary | ICD-10-CM | POA: Diagnosis not present

## 2015-03-14 DIAGNOSIS — M629 Disorder of muscle, unspecified: Secondary | ICD-10-CM

## 2015-03-14 NOTE — Therapy (Signed)
[place] [address] [place] [address] [place], [name], [address] Phone: [phone]   Fax:  [phone]  Physical Therapy Treatment  Patient Details  Name: William [name] MRN: [id number] Date of Birth: [birth year] Referring Provider:  Sydnee Cabal, MD  Encounter Date: 03/14/2015      PT End of Session - 03/14/15 1018    Visit Number 4   PT Start Time 6568   PT Stop Time 1033   PT Time Calculation (min) 58 min   Activity Tolerance Patient limited by fatigue   Behavior During Therapy [facility] for tasks assessed/performed      Past Medical History  Diagnosis Date  . Hyperlipidemia   . ANXIETY   . Urinary frequency   . BPH (benign prostatic hypertrophy)   . Eczema   . Bladder stone   . History of carotid artery stenosis     S/P BILATERAL CEA  . S/P carotid endarterectomy     BILATERAL ICA--  PATENT PER DUPLEX  05-19-2012  . CVD (cerebrovascular disease)   . Vitamin D deficiency   . 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 GERD (gastroesophageal reflux disease)   . Nocturia   . Stroke 1996    left side weakness  . Carotid artery occlusion   . 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 Carotid endarterectomy Bilateral LEFT  11-12-2008  DR GREG HAYES    RIGHT ICA  2006  ([facility])  . Inguinal hernia repair Right 11-08-2006  . Cardiovascular stress test  03-27-2012  DR CRENSHAW    LOW RISK LEXISCAN STUDY-- PROBABLE NORMAL PERFUSION AND SOFT TISSUE ATTENUATION/  NO ISCHEMIA/ EF 51%  . Appendectomy  AS CHILD  . Cystoscopy with litholapaxy N/A 02/26/2013    Procedure: CYSTOSCOPY WITH LITHOLAPAXY;  Surgeon: Franchot Gallo, MD;  Location: [facility];  Service: Urology;  Laterality: N/A;  . Transurethral resection of prostate N/A 02/26/2013    Procedure: TRANSURETHRAL RESECTION OF THE PROSTATE WITH GYRUS INSTRUMENTS;   Surgeon: Franchot Gallo, MD;  Location: [facility];  Service: Urology;  Laterality: N/A;  . Prostate surgery    . Eye surgery      cataract surgery both eyes  . Mohs surgery Left 1/ 2016    Dr Nevada Crane-- Basal cell    There were no vitals filed for this visit.  Visit Diagnosis:  Left hip pain  Difficulty walking  Hamstring tightness of both lower extremities      Subjective Assessment - 03/14/15 0936    Subjective My back has been sore after the last treatment.  I really think that I am getting a little stronger   Currently in Pain? Yes   Pain Score 4    Pain Location Back   Pain Orientation Lower   Pain Descriptors / Indicators Aching;Sore   Pain Type Chronic pain   Pain Onset More than a month ago   Pain Frequency Intermittent                         [facility] Adult PT Treatment/Exercise - 03/14/15 0001    Ambulation/Gait   Gait Comments Gait around the building with 5 rest breaks, as he went up slope he started draggin feet and became very tired.   Standardized Balance Assessment   Standardized Balance Assessment Timed  Up and Go Test   Timed Up and Go Test   TUG Normal TUG   Normal TUG (seconds) 19   Lumbar Exercises: Stretches   Passive Hamstring Stretch 3 reps;30 seconds   Single Knee to Chest Stretch 5 reps;10 seconds   Double Knee to Chest Stretch 5 reps;10 seconds   Lower Trunk Rotation 5 reps;10 seconds   Pelvic Tilt 5 reps;10 seconds   Piriformis Stretch 3 reps;20 seconds   Lumbar Exercises: Aerobic   Tread Mill NuStep L5 x 5 minutes   UBE (Upper Arm Bike) level 5 x 5 minutes   Lumbar Exercises: Machines for Strengthening   Cybex Knee Extension 5# 2x10   Cybex Knee Flexion 25# 2x10   Leg Press 20# 2x10   Other Lumbar Machine Exercise seated row 20#, lats 20#   Modalities   Modalities Electrical Stimulation;Moist Heat   Moist Heat Therapy   Number Minutes Moist Heat 15 Minutes   Moist Heat Location Lumbar Spine    Electrical Stimulation   Electrical Stimulation Location lumbar    Electrical Stimulation Action IFC   Electrical Stimulation Parameters tolerance   Electrical Stimulation Goals Pain                  PT Short Term Goals - 03/14/15 1020    PT SHORT TERM GOAL #1   Title independent with initial HEP   Status Partially Met           PT Long Term Goals - 03/14/15 1021    PT LONG TERM GOAL #1   Title Understand fall risks, the importance of exercise and flexibility to health and function   Status Partially Met               Plan - 03/14/15 1018    Clinical Impression Statement Patient with a slow TUG time, starts to shuffle feet with fatigue.  Very tight HS.  He is giving great effort and doing all that we ask while here, his wife reports that she does not think he is exercising at home   PT Next Visit Plan work on gait, strength and function   Consulted and Agree with Plan of Care Patient        Problem List Patient Active Problem List   Diagnosis Date Noted  . 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  . Great toe pain 02/11/2014  . Dementia with behavioral disturbance 01/09/2014  . History of right MCA stroke 11/09/2013  . Obesity (BMI 30-39.9) 06/25/2013  . Renal insufficiency 06/25/2013  . Weakness of left arm 06/25/2013  . Aftercare following surgery of the circulatory system, [place] 05/17/2013  . Confusion 04/17/2012  . Sebaceous cyst 03/03/2011  . LUMBAR SPRAIN AND STRAIN 07/31/2010  . URINARY FREQUENCY 09/25/2009  . RIB PAIN, LEFT SIDED 08/29/2009  . DEPRESSION/ANXIETY 08/01/2009  . CAROTID ARTERY STENOSIS, BILATERAL 05/02/2009  . DIZZINESS 05/02/2009  . HEADACHE 05/02/2009  . ECZEMA, ATOPIC 05/31/2008  . UNSPECIFIED VITAMIN D DEFICIENCY 03/01/2008  . FATIGUE 01/26/2008  . BENIGN PROSTATIC HYPERTROPHY, HX OF 08/06/2007  . ANXIETY 12/21/2006  . HOARSENESS 12/21/2006  . FASTING HYPERGLYCEMIA 12/21/2006  . Hyperlipidemia LDL goal <100  09/27/2006  . Unspecified cerebral artery occlusion with cerebral infarction 09/27/2006    Sumner Boast., PT 03/14/2015, 10:22 AM  [address] [place], [name], [address] Phone: [phone]   Fax:  [phone]

## 2015-03-17 ENCOUNTER — Encounter: Payer: Self-pay | Admitting: Physical Therapy

## 2015-03-17 ENCOUNTER — Ambulatory Visit: Payer: PPO | Attending: Specialist | Admitting: Physical Therapy

## 2015-03-17 DIAGNOSIS — M629 Disorder of muscle, unspecified: Secondary | ICD-10-CM

## 2015-03-17 DIAGNOSIS — M25552 Pain in left hip: Secondary | ICD-10-CM | POA: Insufficient documentation

## 2015-03-17 DIAGNOSIS — R262 Difficulty in walking, not elsewhere classified: Secondary | ICD-10-CM | POA: Diagnosis present

## 2015-03-17 NOTE — Therapy (Signed)
[place], [name], [address] Phone: [phone]   Fax:  [phone]  Physical Therapy Treatment  Patient Details  Name: William [name] MRN: [id number] Date of Birth: [birth year] Referring Provider:  Sydnee Cabal, MD  Encounter Date: 03/17/2015      PT End of Session - 03/17/15 1052    Visit Number 5   PT Start Time 1020   PT Stop Time 1117   PT Time Calculation (min) 57 min   Activity Tolerance Patient limited by fatigue   Behavior During Therapy [facility] for tasks assessed/performed      Past Medical History  Diagnosis Date  . Hyperlipidemia   . ANXIETY   . Urinary frequency   . BPH (benign prostatic hypertrophy)   . Eczema   . Bladder stone   . History of carotid artery stenosis     S/P BILATERAL CEA  . S/P carotid endarterectomy     BILATERAL ICA--  PATENT PER DUPLEX  05-19-2012  . CVD (cerebrovascular disease)   . Vitamin D deficiency   . 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 GERD (gastroesophageal reflux disease)   . Nocturia   . Stroke [facility]) 1996    left side weakness  . Carotid artery occlusion   . 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 Carotid endarterectomy Bilateral LEFT  11-12-2008  DR GREG HAYES    RIGHT ICA  2006  ([facility])  . Inguinal hernia repair Right 11-08-2006  . Cardiovascular stress test  03-27-2012  DR CRENSHAW    LOW RISK LEXISCAN STUDY-- PROBABLE NORMAL PERFUSION AND SOFT TISSUE ATTENUATION/  NO ISCHEMIA/ EF 51%  . Appendectomy  AS CHILD  . Cystoscopy with litholapaxy N/A 02/26/2013    Procedure: CYSTOSCOPY WITH LITHOLAPAXY;  Surgeon: Franchot Gallo, MD;  Location: [facility];  Service: Urology;  Laterality: N/A;  . Transurethral resection of prostate N/A 02/26/2013    Procedure: TRANSURETHRAL RESECTION OF THE PROSTATE WITH GYRUS  INSTRUMENTS;  Surgeon: Franchot Gallo, MD;  Location: [facility];  Service: Urology;  Laterality: N/A;  . Prostate surgery    . Eye surgery      cataract surgery both eyes  . Mohs surgery Left 1/ 2016    Dr Nevada Crane-- Basal cell    There were no vitals filed for this visit.  Visit Diagnosis:  Left hip pain  Difficulty walking  Hamstring tightness of both lower extremities      Subjective Assessment - 03/17/15 1028    Subjective Walking better.  No falls.   Currently in Pain? Yes   Pain Score 3    Pain Location Back   Pain Orientation Lower   Pain Descriptors / Indicators Aching;Sore   Pain Type Chronic pain   Aggravating Factors  rest this treatment   Pain Relieving Factors walking and standing                         [facility] Adult PT Treatment/Exercise - 03/17/15 0001    Ambulation/Gait   Gait Comments Gait around the building with 5 rest breaks, as he went up slope he started draggin feet and became very tired.   Lumbar Exercises: Stretches   Passive Hamstring Stretch 3 reps;30 seconds   Single Knee to Chest Stretch 5 reps;10 seconds  Double Knee to Chest Stretch 5 reps;10 seconds   Lower Trunk Rotation 5 reps;10 seconds   Pelvic Tilt 5 reps;10 seconds   Piriformis Stretch 3 reps;20 seconds   Lumbar Exercises: Aerobic   Tread Mill NuStep L5 x 5 minutes   UBE (Upper Arm Bike) level 5 x 5 minutes   Lumbar Exercises: Machines for Strengthening   Cybex Knee Extension 10# 2x10   Cybex Knee Flexion 35# 2x15   Leg Press 20# 2x10   Other Lumbar Machine Exercise seated row 25#, lats 25#, 5# chest press   Lumbar Exercises: Standing   Other Standing Lumbar Exercises Seated weighted ball lifts and then weighted ball obliques   Modalities   Modalities Electrical Stimulation;Moist Heat   Moist Heat Therapy   Number Minutes Moist Heat 15 Minutes   Moist Heat Location Lumbar Spine   Electrical Stimulation   Electrical Stimulation Location  lumbar    Electrical Stimulation Action IFC   Electrical Stimulation Parameters tolerance    Electrical Stimulation Goals Pain                  PT Short Term Goals - 03/14/15 1020    PT SHORT TERM GOAL #1   Title independent with initial HEP   Status Partially Met           PT Long Term Goals - 03/14/15 1021    PT LONG TERM GOAL #1   Title Understand fall risks, the importance of exercise and flexibility to health and function   Status Partially Met               Plan - 03/17/15 1052    Clinical Impression Statement Very fatigued with going up hill outside today, had to rest 3 x on the uphill   PT Next Visit Plan work on gait, strength and function   Consulted and Agree with Plan of Care Patient        Problem List Patient Active Problem List   Diagnosis Date Noted  . 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  . Great toe pain 02/11/2014  . Dementia with behavioral disturbance 01/09/2014  . History of right MCA stroke 11/09/2013  . Obesity (BMI 30-39.9) 06/25/2013  . Renal insufficiency 06/25/2013  . Weakness of left arm 06/25/2013  . Aftercare following surgery of the circulatory system, [place] 05/17/2013  . Confusion 04/17/2012  . Sebaceous cyst 03/03/2011  . LUMBAR SPRAIN AND STRAIN 07/31/2010  . URINARY FREQUENCY 09/25/2009  . RIB PAIN, LEFT SIDED 08/29/2009  . DEPRESSION/ANXIETY 08/01/2009  . CAROTID ARTERY STENOSIS, BILATERAL 05/02/2009  . DIZZINESS 05/02/2009  . HEADACHE 05/02/2009  . ECZEMA, ATOPIC 05/31/2008  . UNSPECIFIED VITAMIN D DEFICIENCY 03/01/2008  . FATIGUE 01/26/2008  . BENIGN PROSTATIC HYPERTROPHY, HX OF 08/06/2007  . ANXIETY 12/21/2006  . HOARSENESS 12/21/2006  . FASTING HYPERGLYCEMIA 12/21/2006  . Hyperlipidemia LDL goal <100 09/27/2006  . Unspecified cerebral artery occlusion with cerebral infarction 09/27/2006    Sumner Boast., PT 03/17/2015, 11:00 AM  [address] [place], [name], [address] Phone: [phone]   Fax:  [phone]

## 2015-03-20 ENCOUNTER — Encounter: Payer: Self-pay | Admitting: Physical Therapy

## 2015-03-20 ENCOUNTER — Ambulatory Visit: Payer: PPO | Admitting: Physical Therapy

## 2015-03-20 DIAGNOSIS — M629 Disorder of muscle, unspecified: Secondary | ICD-10-CM

## 2015-03-20 DIAGNOSIS — M25552 Pain in left hip: Secondary | ICD-10-CM | POA: Diagnosis not present

## 2015-03-20 DIAGNOSIS — R262 Difficulty in walking, not elsewhere classified: Secondary | ICD-10-CM

## 2015-03-20 NOTE — Therapy (Signed)
[place], [name], [address] Phone: [phone]   Fax:  [phone]  Physical Therapy Treatment  Patient Details  Name: William [name] MRN: [id number] Date of Birth: Jan 31, 1941 Referring Provider:  Sydnee Cabal, MD  Encounter Date: 03/20/2015      PT End of Session - 03/20/15 1529    Visit Number 6   PT Start Time 4128   PT Stop Time 7867   PT Time Calculation (min) 59 min   Activity Tolerance Patient limited by fatigue   Behavior During Therapy [facility] for tasks assessed/performed      Past Medical History  Diagnosis Date  . Hyperlipidemia   . ANXIETY   . Urinary frequency   . BPH (benign prostatic hypertrophy)   . Eczema   . Bladder stone   . History of carotid artery stenosis     S/P BILATERAL CEA  . S/P carotid endarterectomy     BILATERAL ICA--  PATENT PER DUPLEX  05-19-2012  . CVD (cerebrovascular disease)   . Vitamin D deficiency   . 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 GERD (gastroesophageal reflux disease)   . Nocturia   . Stroke [facility]) 1996    left side weakness  . Carotid artery occlusion   . 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 Carotid endarterectomy Bilateral LEFT  11-12-2008  DR GREG HAYES    RIGHT ICA  2006  ([facility])  . Inguinal hernia repair Right 11-08-2006  . Cardiovascular stress test  03-27-2012  DR CRENSHAW    LOW RISK LEXISCAN STUDY-- PROBABLE NORMAL PERFUSION AND SOFT TISSUE ATTENUATION/  NO ISCHEMIA/ EF 51%  . Appendectomy  AS CHILD  . Cystoscopy with litholapaxy N/A 02/26/2013    Procedure: CYSTOSCOPY WITH LITHOLAPAXY;  Surgeon: Franchot Gallo, MD;  Location: [facility];  Service: Urology;  Laterality: N/A;  . Transurethral resection of prostate N/A 02/26/2013    Procedure: TRANSURETHRAL RESECTION OF THE PROSTATE WITH GYRUS  INSTRUMENTS;  Surgeon: Franchot Gallo, MD;  Location: [facility];  Service: Urology;  Laterality: N/A;  . Prostate surgery    . Eye surgery      cataract surgery both eyes  . Mohs surgery Left 1/ 2016    Dr Nevada Crane-- Basal cell    There were no vitals filed for this visit.  Visit Diagnosis:  Left hip pain  Difficulty walking  Hamstring tightness of both lower extremities      Subjective Assessment - 03/20/15 1453    Subjective I was pretty tired after last time, had to take a nap, my legs feel heavy today   Currently in Pain? Yes   Pain Score 3    Pain Location Back   Pain Orientation Lower   Pain Descriptors / Indicators Aching   Pain Type Chronic pain                         [facility] Adult PT Treatment/Exercise - 03/20/15 0001    Ambulation/Gait   Gait Comments gait today down stairs and then 1/2 way around the building   High Level Balance   High Level Balance Comments standing on airex ball toss   Lumbar Exercises: Stretches   Passive Hamstring Stretch 3 reps;30 seconds   Quad Stretch 3 reps;10 seconds  Piriformis Stretch 3 reps;20 seconds   Lumbar Exercises: Aerobic   Tread Mill NuStep L6 x 5 minutes   Lumbar Exercises: Machines for Strengthening   Cybex Knee Extension 10# 2x10   Cybex Knee Flexion 35# 2x15   Leg Press 20# 2x10   Other Lumbar Machine Exercise seated row 25#, lats 25#, 5# chest press   Knee/Hip Exercises: Standing   Other Standing Knee Exercises sitting, weighted ball overhead lifts and then side to side   Moist Heat Therapy   Number Minutes Moist Heat 15 Minutes   Moist Heat Location Lumbar Spine   Electrical Stimulation   Electrical Stimulation Location lumbar    Electrical Stimulation Action IFC   Electrical Stimulation Parameters tolerance   Electrical Stimulation Goals Pain                  PT Short Term Goals - 03/14/15 1020    PT SHORT TERM GOAL #1   Title independent with initial HEP    Status Partially Met           PT Long Term Goals - 03/14/15 1021    PT LONG TERM GOAL #1   Title Understand fall risks, the importance of exercise and flexibility to health and function   Status Partially Met               Plan - 03/20/15 1531    Clinical Impression Statement Very difficult for him to stand on dynamic surface today, very unsteady   PT Next Visit Plan work on gait, strength and function   Consulted and Agree with Plan of Care Patient        Problem List Patient Active Problem List   Diagnosis Date Noted  . 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  . Great toe pain 02/11/2014  . Dementia with behavioral disturbance 01/09/2014  . History of right MCA stroke 11/09/2013  . Obesity (BMI 30-39.9) 06/25/2013  . Renal insufficiency 06/25/2013  . Weakness of left arm 06/25/2013  . Aftercare following surgery of the circulatory system, [place] 05/17/2013  . Confusion 04/17/2012  . Sebaceous cyst 03/03/2011  . LUMBAR SPRAIN AND STRAIN 07/31/2010  . URINARY FREQUENCY 09/25/2009  . RIB PAIN, LEFT SIDED 08/29/2009  . DEPRESSION/ANXIETY 08/01/2009  . CAROTID ARTERY STENOSIS, BILATERAL 05/02/2009  . DIZZINESS 05/02/2009  . HEADACHE 05/02/2009  . ECZEMA, ATOPIC 05/31/2008  . UNSPECIFIED VITAMIN D DEFICIENCY 03/01/2008  . FATIGUE 01/26/2008  . BENIGN PROSTATIC HYPERTROPHY, HX OF 08/06/2007  . ANXIETY 12/21/2006  . HOARSENESS 12/21/2006  . FASTING HYPERGLYCEMIA 12/21/2006  . Hyperlipidemia LDL goal <100 09/27/2006  . Unspecified cerebral artery occlusion with cerebral infarction 09/27/2006    Sumner Boast., PT 03/20/2015, 3:35 PM  [address] [address] [place], [name], [address] Phone: [phone]   Fax:  [phone]

## 2015-03-24 ENCOUNTER — Ambulatory Visit: Payer: PPO | Admitting: Physical Therapy

## 2015-03-24 ENCOUNTER — Encounter: Payer: Self-pay | Admitting: Physical Therapy

## 2015-03-24 DIAGNOSIS — M25552 Pain in left hip: Secondary | ICD-10-CM | POA: Diagnosis not present

## 2015-03-24 DIAGNOSIS — M629 Disorder of muscle, unspecified: Secondary | ICD-10-CM

## 2015-03-24 DIAGNOSIS — R262 Difficulty in walking, not elsewhere classified: Secondary | ICD-10-CM

## 2015-03-24 NOTE — Therapy (Signed)
[place], [name], [address] Phone: [phone]   Fax:  [phone]  Physical Therapy Treatment  Patient Details  Name: William [name] MRN: [id number] Date of Birth: September 10, 1940 Referring Provider:  Sydnee Cabal, MD  Encounter Date: 03/24/2015      PT End of Session - 03/24/15 1011    Visit Number 7   PT Start Time 8413   PT Stop Time 1030   PT Time Calculation (min) 55 min   Activity Tolerance Patient limited by fatigue   Behavior During Therapy [facility] for tasks assessed/performed      Past Medical History  Diagnosis Date  . Hyperlipidemia   . ANXIETY   . Urinary frequency   . BPH (benign prostatic hypertrophy)   . Eczema   . Bladder stone   . History of carotid artery stenosis     S/P BILATERAL CEA  . S/P carotid endarterectomy     BILATERAL ICA--  PATENT PER DUPLEX  05-19-2012  . CVD (cerebrovascular disease)   . Vitamin D deficiency   . 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 GERD (gastroesophageal reflux disease)   . Nocturia   . Stroke [facility]) 1996    left side weakness  . Carotid artery occlusion   . 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 Carotid endarterectomy Bilateral LEFT  11-12-2008  DR GREG HAYES    RIGHT ICA  2006  ([facility])  . Inguinal hernia repair Right 11-08-2006  . Cardiovascular stress test  03-27-2012  DR CRENSHAW    LOW RISK LEXISCAN STUDY-- PROBABLE NORMAL PERFUSION AND SOFT TISSUE ATTENUATION/  NO ISCHEMIA/ EF 51%  . Appendectomy  AS CHILD  . Cystoscopy with litholapaxy N/A 02/26/2013    Procedure: CYSTOSCOPY WITH LITHOLAPAXY;  Surgeon: Franchot Gallo, MD;  Location: [facility];  Service: Urology;  Laterality: N/A;  . Transurethral resection of prostate N/A 02/26/2013    Procedure: TRANSURETHRAL RESECTION OF THE PROSTATE WITH GYRUS  INSTRUMENTS;  Surgeon: Franchot Gallo, MD;  Location: [facility];  Service: Urology;  Laterality: N/A;  . Prostate surgery    . Eye surgery      cataract surgery both eyes  . Mohs surgery Left 1/ 2016    Dr Nevada Crane-- Basal cell    There were no vitals filed for this visit.  Visit Diagnosis:  Left hip pain  Difficulty walking  Hamstring tightness of both lower extremities      Subjective Assessment - 03/24/15 0936    Subjective I have stretched at least twice over the weekend.   Currently in Pain? Yes   Pain Score 2    Pain Location Back   Pain Orientation Lower   Pain Descriptors / Indicators Aching   Pain Type Chronic pain   Pain Onset More than a month ago   Pain Frequency Intermittent   Aggravating Factors  treatment   Pain Relieving Factors walking                         [facility] Adult PT Treatment/Exercise - 03/24/15 0001    Ambulation/Gait   Gait Comments gait around building 1 1/2 laps   Lumbar Exercises: Stretches   Passive Hamstring Stretch 3 reps;30 seconds   Quad Stretch 3 reps;10 seconds   Piriformis Stretch  3 reps;20 seconds   Lumbar Exercises: Aerobic   Tread Mill NuStep L6 x 6 minutes   Lumbar Exercises: Machines for Strengthening   Cybex Knee Extension 10# 2x10   Cybex Knee Flexion 35# 2x15   Leg Press 20# 2x10   Other Lumbar Machine Exercise seated row 25#, lats 25#, 5# chest press   Knee/Hip Exercises: Standing   Other Standing Knee Exercises sitting, weighted ball overhead lifts and then side to side   Moist Heat Therapy   Number Minutes Moist Heat 15 Minutes   Moist Heat Location Lumbar Spine   Electrical Stimulation   Electrical Stimulation Location lumbar    Electrical Stimulation Action IFC   Electrical Stimulation Parameters tolerance   Electrical Stimulation Goals Pain                  PT Short Term Goals - 03/14/15 1020    PT SHORT TERM GOAL #1   Title independent with initial HEP   Status  Partially Met           PT Long Term Goals - 03/14/15 1021    PT LONG TERM GOAL #1   Title Understand fall risks, the importance of exercise and flexibility to health and function   Status Partially Met               Plan - 03/24/15 1012    Clinical Impression Statement Does not want to do much balance as he is fearful.  Needed 4 rests for 1 1/2 laps   PT Next Visit Plan work on gait, strength and function   Consulted and Agree with Plan of Care Patient        Problem List Patient Active Problem List   Diagnosis Date Noted  . 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  . Great toe pain 02/11/2014  . Dementia with behavioral disturbance 01/09/2014  . History of right MCA stroke 11/09/2013  . Obesity (BMI 30-39.9) 06/25/2013  . Renal insufficiency 06/25/2013  . Weakness of left arm 06/25/2013  . Aftercare following surgery of the circulatory system, [place] 05/17/2013  . Confusion 04/17/2012  . Sebaceous cyst 03/03/2011  . LUMBAR SPRAIN AND STRAIN 07/31/2010  . URINARY FREQUENCY 09/25/2009  . RIB PAIN, LEFT SIDED 08/29/2009  . DEPRESSION/ANXIETY 08/01/2009  . CAROTID ARTERY STENOSIS, BILATERAL 05/02/2009  . DIZZINESS 05/02/2009  . HEADACHE 05/02/2009  . ECZEMA, ATOPIC 05/31/2008  . UNSPECIFIED VITAMIN D DEFICIENCY 03/01/2008  . FATIGUE 01/26/2008  . BENIGN PROSTATIC HYPERTROPHY, HX OF 08/06/2007  . ANXIETY 12/21/2006  . HOARSENESS 12/21/2006  . FASTING HYPERGLYCEMIA 12/21/2006  . Hyperlipidemia LDL goal <100 09/27/2006  . Unspecified cerebral artery occlusion with cerebral infarction 09/27/2006    Sumner Boast., PT 03/24/2015, 10:13 AM  [address] [place], [name], [address] Phone: [phone]   Fax:  [phone]

## 2015-03-26 ENCOUNTER — Ambulatory Visit: Payer: PPO | Admitting: Physical Therapy

## 2015-03-26 ENCOUNTER — Encounter: Payer: Self-pay | Admitting: Physical Therapy

## 2015-03-26 DIAGNOSIS — R262 Difficulty in walking, not elsewhere classified: Secondary | ICD-10-CM

## 2015-03-26 DIAGNOSIS — M629 Disorder of muscle, unspecified: Secondary | ICD-10-CM

## 2015-03-26 DIAGNOSIS — M25552 Pain in left hip: Secondary | ICD-10-CM | POA: Diagnosis not present

## 2015-03-26 NOTE — Therapy (Signed)
[facility] [place], [name], [address] Phone: [phone]   Fax:  [phone]  Physical Therapy Treatment  Patient Details  Name: William [name] MRN: [id number] Date of Birth: [birth year] Referring Provider:  Sydnee Cabal, MD  Encounter Date: 03/26/2015      PT End of Session - 03/26/15 1026    Visit Number 8   PT Start Time 0930   PT Stop Time 1030   PT Time Calculation (min) 60 min   Activity Tolerance Patient limited by fatigue;Patient limited by pain   Behavior During Therapy [facility] for tasks assessed/performed      Past Medical History  Diagnosis Date  . Hyperlipidemia   . ANXIETY   . Urinary frequency   . BPH (benign prostatic hypertrophy)   . Eczema   . Bladder stone   . History of carotid artery stenosis     S/P BILATERAL CEA  . S/P carotid endarterectomy     BILATERAL ICA--  PATENT PER DUPLEX  05-19-2012  . CVD (cerebrovascular disease)   . Vitamin D deficiency   . 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 GERD (gastroesophageal reflux disease)   . Nocturia   . Stroke [facility]) 1996    left side weakness  . Carotid artery occlusion   . 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 Carotid endarterectomy Bilateral LEFT  11-12-2008  DR GREG HAYES    RIGHT ICA  2006  ([facility])  . Inguinal hernia repair Right 11-08-2006  . Cardiovascular stress test  03-27-2012  DR CRENSHAW    LOW RISK LEXISCAN STUDY-- PROBABLE NORMAL PERFUSION AND SOFT TISSUE ATTENUATION/  NO ISCHEMIA/ EF 51%  . Appendectomy  AS CHILD  . Cystoscopy with litholapaxy N/A 02/26/2013    Procedure: CYSTOSCOPY WITH LITHOLAPAXY;  Surgeon: Franchot Gallo, MD;  Location: [facility];  Service: Urology;  Laterality: N/A;  . Transurethral resection of prostate N/A 02/26/2013    Procedure: TRANSURETHRAL RESECTION OF THE  PROSTATE WITH GYRUS INSTRUMENTS;  Surgeon: Franchot Gallo, MD;  Location: [facility];  Service: Urology;  Laterality: N/A;  . Prostate surgery    . Eye surgery      cataract surgery both eyes  . Mohs surgery Left 1/ 2016    Dr Nevada Crane-- Basal cell    There were no vitals filed for this visit.  Visit Diagnosis:  Left hip pain  Difficulty walking  Hamstring tightness of both lower extremities      Subjective Assessment - 03/26/15 0944    Subjective I was tired after the bigger walk last time.  My hip hurt a little more    Currently in Pain? Yes   Pain Score 3    Pain Location Hip   Pain Orientation Left   Pain Descriptors / Indicators Aching   Pain Type Chronic pain   Aggravating Factors  walking   Pain Relieving Factors treatment                         [facility] Adult PT Treatment/Exercise - 03/26/15 0001    Ambulation/Gait   Gait Comments 2 laps around building 2 breaks for each lap and some increased pain in the left hip on the second lap   Timed Up and Go Test   TUG Normal TUG  Normal TUG (seconds) 17   High Level Balance   High Level Balance Comments standing on airex ball toss, hockey shots   Lumbar Exercises: Stretches   Passive Hamstring Stretch 3 reps;30 seconds   Piriformis Stretch 3 reps;20 seconds   Lumbar Exercises: Aerobic   Tread Mill NuStep L6 x 6 minutes   Lumbar Exercises: Machines for Strengthening   Cybex Knee Extension 10# 2x10   Cybex Knee Flexion 35# 2x15   Leg Press 20# 2x10   Other Lumbar Machine Exercise seated row 25#, lats 25#, 5# chest press   Knee/Hip Exercises: Standing   Other Standing Knee Exercises sitting, weighted ball overhead lifts and then side to side   Moist Heat Therapy   Number Minutes Moist Heat 15 Minutes   Moist Heat Location Hip   Electrical Stimulation   Electrical Stimulation Location left hip   Electrical Stimulation Action IFC   Electrical Stimulation Parameters tolerance    Electrical Stimulation Goals Pain                  PT Short Term Goals - 03/14/15 1020    PT SHORT TERM GOAL #1   Title independent with initial HEP   Status Partially Met           PT Long Term Goals - 03/14/15 1021    PT LONG TERM GOAL #1   Title Understand fall risks, the importance of exercise and flexibility to health and function   Status Partially Met               Plan - 03/26/15 1026    Clinical Impression Statement The more he walks the more pain in the left hip, tolerating longer times walking and being up, still does not like the balance activities due to fear   PT Next Visit Plan work on gait, strength and function   Consulted and Agree with Plan of Care Patient        Problem List Patient Active Problem List   Diagnosis Date Noted  . 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  . Great toe pain 02/11/2014  . Dementia with behavioral disturbance 01/09/2014  . History of right MCA stroke 11/09/2013  . Obesity (BMI 30-39.9) 06/25/2013  . Renal insufficiency 06/25/2013  . Weakness of left arm 06/25/2013  . Aftercare following surgery of the circulatory system, [place] 05/17/2013  . Confusion 04/17/2012  . Sebaceous cyst 03/03/2011  . LUMBAR SPRAIN AND STRAIN 07/31/2010  . URINARY FREQUENCY 09/25/2009  . RIB PAIN, LEFT SIDED 08/29/2009  . DEPRESSION/ANXIETY 08/01/2009  . CAROTID ARTERY STENOSIS, BILATERAL 05/02/2009  . DIZZINESS 05/02/2009  . HEADACHE 05/02/2009  . ECZEMA, ATOPIC 05/31/2008  . UNSPECIFIED VITAMIN D DEFICIENCY 03/01/2008  . FATIGUE 01/26/2008  . BENIGN PROSTATIC HYPERTROPHY, HX OF 08/06/2007  . ANXIETY 12/21/2006  . HOARSENESS 12/21/2006  . FASTING HYPERGLYCEMIA 12/21/2006  . Hyperlipidemia LDL goal <100 09/27/2006  . Unspecified cerebral artery occlusion with cerebral infarction 09/27/2006    Sumner Boast., PT 03/26/2015, 10:28 AM  [place]  [address], [name], [address] Phone: [phone]   Fax:  [phone]

## 2015-03-27 ENCOUNTER — Other Ambulatory Visit: Payer: Self-pay | Admitting: Family Medicine

## 2015-03-31 ENCOUNTER — Encounter: Payer: Self-pay | Admitting: Physical Therapy

## 2015-03-31 ENCOUNTER — Ambulatory Visit: Payer: PPO | Admitting: Physical Therapy

## 2015-03-31 DIAGNOSIS — M25552 Pain in left hip: Secondary | ICD-10-CM

## 2015-03-31 DIAGNOSIS — M629 Disorder of muscle, unspecified: Secondary | ICD-10-CM

## 2015-03-31 DIAGNOSIS — R262 Difficulty in walking, not elsewhere classified: Secondary | ICD-10-CM

## 2015-03-31 NOTE — Therapy (Signed)
[facility] [place], [name], [address] Phone: [phone]   Fax:  [phone]  Physical Therapy Treatment  Patient Details  Name: William [name] MRN: [id number] Date of Birth: Jun 08, 1941 No Data Recorded  Encounter Date: 03/31/2015      PT End of Session - 03/31/15 1048    Visit Number 9   PT Start Time 0845   PT Stop Time 0948   PT Time Calculation (min) 63 min   Activity Tolerance Patient limited by fatigue;Patient limited by pain   Behavior During Therapy [facility] for tasks assessed/performed      Past Medical History  Diagnosis Date  . Hyperlipidemia   . ANXIETY   . Urinary frequency   . BPH (benign prostatic hypertrophy)   . Eczema   . Bladder stone   . History of carotid artery stenosis     S/P BILATERAL CEA  . S/P carotid endarterectomy     BILATERAL ICA--  PATENT PER DUPLEX  05-19-2012  . CVD (cerebrovascular disease)   . Vitamin D deficiency   . 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 GERD (gastroesophageal reflux disease)   . Nocturia   . Stroke [facility]) 1996    left side weakness  . Carotid artery occlusion   . 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 Carotid endarterectomy Bilateral LEFT  11-12-2008  DR GREG HAYES    RIGHT ICA  2006  ([facility])  . Inguinal hernia repair Right 11-08-2006  . Cardiovascular stress test  03-27-2012  DR CRENSHAW    LOW RISK LEXISCAN STUDY-- PROBABLE NORMAL PERFUSION AND SOFT TISSUE ATTENUATION/  NO ISCHEMIA/ EF 51%  . Appendectomy  AS CHILD  . Cystoscopy with litholapaxy N/A 02/26/2013    Procedure: CYSTOSCOPY WITH LITHOLAPAXY;  Surgeon: Franchot Gallo, MD;  Location: [facility];  Service: Urology;  Laterality: N/A;  . Transurethral resection of prostate N/A 02/26/2013    Procedure: TRANSURETHRAL RESECTION OF THE PROSTATE WITH GYRUS  INSTRUMENTS;  Surgeon: Franchot Gallo, MD;  Location: [facility];  Service: Urology;  Laterality: N/A;  . Prostate surgery    . Eye surgery      cataract surgery both eyes  . Mohs surgery Left 1/ 2016    Dr Nevada Crane-- Basal cell    There were no vitals filed for this visit.  Visit Diagnosis:  Left hip pain  Difficulty walking  Hamstring tightness of both lower extremities      Subjective Assessment - 03/31/15 0850    Subjective I have to take a nap after I leave here.   Currently in Pain? Yes   Pain Score 2    Pain Location Hip   Pain Orientation Left   Aggravating Factors  walking increases the pain in the left hip   Pain Relieving Factors rest                         [facility] Adult PT Treatment/Exercise - 03/31/15 0001    Ambulation/Gait   Gait Comments 2 laps around building 2 breaks for each lap and some increased pain in the left hip on the second lap   High Level Balance   High Level Balance Comments standing on airex ball toss, hockey shots   Lumbar Exercises: Stretches   Passive Hamstring Stretch  3 reps;30 seconds   Piriformis Stretch 3 reps;20 seconds   Lumbar Exercises: Aerobic   Tread Mill NuStep L6 x 6 minutes   Lumbar Exercises: Machines for Strengthening   Cybex Knee Extension 10# 2x10   Cybex Knee Flexion 35# 2x15   Other Lumbar Machine Exercise seated row 25#, lats 25#, 5# chest press, 25# straight arm pull downs   Knee/Hip Exercises: Standing   Other Standing Knee Exercises sitting, weighted ball overhead lifts and then side to side   Moist Heat Therapy   Number Minutes Moist Heat 15 Minutes   Moist Heat Location Hip   Electrical Stimulation   Electrical Stimulation Location left hip   Electrical Stimulation Action IFC   Electrical Stimulation Parameters tolerance   Electrical Stimulation Goals Pain                  PT Short Term Goals - 03/14/15 1020    PT SHORT TERM GOAL #1   Title independent with  initial HEP   Status Partially Met           PT Long Term Goals - 03/14/15 1021    PT LONG TERM GOAL #1   Title Understand fall risks, the importance of exercise and flexibility to health and function   Status Partially Met               Plan - 03/31/15 1049    Clinical Impression Statement Still fearful of any balance activity, allowing more activity, trying to walk at home   PT Next Visit Plan will continue with the gait and function to help him be more independent and active   Consulted and Agree with Plan of Care Patient        Problem List Patient Active Problem List   Diagnosis Date Noted  . 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  . Great toe pain 02/11/2014  . Dementia with behavioral disturbance 01/09/2014  . History of right MCA stroke 11/09/2013  . Obesity (BMI 30-39.9) 06/25/2013  . Renal insufficiency 06/25/2013  . Weakness of left arm 06/25/2013  . Aftercare following surgery of the circulatory system, [place] 05/17/2013  . Confusion 04/17/2012  . Sebaceous cyst 03/03/2011  . LUMBAR SPRAIN AND STRAIN 07/31/2010  . URINARY FREQUENCY 09/25/2009  . RIB PAIN, LEFT SIDED 08/29/2009  . DEPRESSION/ANXIETY 08/01/2009  . CAROTID ARTERY STENOSIS, BILATERAL 05/02/2009  . DIZZINESS 05/02/2009  . HEADACHE 05/02/2009  . ECZEMA, ATOPIC 05/31/2008  . UNSPECIFIED VITAMIN D DEFICIENCY 03/01/2008  . FATIGUE 01/26/2008  . BENIGN PROSTATIC HYPERTROPHY, HX OF 08/06/2007  . ANXIETY 12/21/2006  . HOARSENESS 12/21/2006  . FASTING HYPERGLYCEMIA 12/21/2006  . Hyperlipidemia LDL goal <100 09/27/2006  . Unspecified cerebral artery occlusion with cerebral infarction 09/27/2006    Sumner Boast., PT 03/31/2015, 11:37 AM  [place], [name], [address] Phone: [phone]   Fax:  [phone]  Name: William [name] MRN: [id number] Date of Birth: 11/26/1940

## 2015-04-01 ENCOUNTER — Telehealth: Payer: Self-pay | Admitting: *Deleted

## 2015-04-01 NOTE — Telephone Encounter (Signed)
PA for brand Nexium completed via phone. Awaiting determination. JG//CMA

## 2015-04-03 ENCOUNTER — Ambulatory Visit: Payer: PPO | Admitting: Physical Therapy

## 2015-04-03 ENCOUNTER — Encounter: Payer: Self-pay | Admitting: Family Medicine

## 2015-04-03 NOTE — Telephone Encounter (Signed)
PA approved effective 04/02/2015 through 06/14/2015. Approval letter sent for scanning. JG//CMA

## 2015-04-04 ENCOUNTER — Encounter: Payer: Self-pay | Admitting: Physical Therapy

## 2015-04-04 ENCOUNTER — Ambulatory Visit: Payer: PPO | Admitting: Physical Therapy

## 2015-04-04 DIAGNOSIS — R262 Difficulty in walking, not elsewhere classified: Secondary | ICD-10-CM

## 2015-04-04 DIAGNOSIS — M25552 Pain in left hip: Secondary | ICD-10-CM

## 2015-04-04 DIAGNOSIS — M629 Disorder of muscle, unspecified: Secondary | ICD-10-CM

## 2015-04-04 NOTE — Therapy (Signed)
[facility] [address] [place], [place], [address] Phone: [phone]   Fax:  [phone]  Physical Therapy Treatment  Patient Details  Name: William [name] MRN: [id number] Date of Birth: [birth year] No Data Recorded  Encounter Date: 04/04/2015      PT End of Session - 04/04/15 1028    Visit Number 10   PT Start Time 0847   PT Stop Time 0948   PT Time Calculation (min) 61 min   Activity Tolerance Patient limited by fatigue;Patient limited by pain   Behavior During Therapy [facility] for tasks assessed/performed      Past Medical History  Diagnosis Date  . Hyperlipidemia   . ANXIETY   . Urinary frequency   . BPH (benign prostatic hypertrophy)   . Eczema   . Bladder stone   . History of carotid artery stenosis     S/P BILATERAL CEA  . S/P carotid endarterectomy     BILATERAL ICA--  PATENT PER DUPLEX  05-19-2012  . CVD (cerebrovascular disease)   . Vitamin D deficiency   . 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 GERD (gastroesophageal reflux disease)   . Nocturia   . Stroke [facility]) 1996    left side weakness  . Carotid artery occlusion   . 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 Carotid endarterectomy Bilateral LEFT  11-12-2008  DR GREG HAYES    RIGHT ICA  2006  ([facility])  . Inguinal hernia repair Right 11-08-2006  . Cardiovascular stress test  03-27-2012  DR CRENSHAW    LOW RISK LEXISCAN STUDY-- PROBABLE NORMAL PERFUSION AND SOFT TISSUE ATTENUATION/  NO ISCHEMIA/ EF 51%  . Appendectomy  AS CHILD  . Cystoscopy with litholapaxy N/A 02/26/2013    Procedure: CYSTOSCOPY WITH LITHOLAPAXY;  Surgeon: Marcine Matar, MD;  Location: [facility];  Service: Urology;  Laterality: N/A;  . Transurethral resection of prostate N/A 02/26/2013    Procedure: TRANSURETHRAL RESECTION OF THE PROSTATE WITH GYRUS  INSTRUMENTS;  Surgeon: Marcine Matar, MD;  Location: [facility];  Service: Urology;  Laterality: N/A;  . Prostate surgery    . Eye surgery      cataract surgery both eyes  . Mohs surgery Left 1/ 2016    Dr Margo Aye-- Basal cell    There were no vitals filed for this visit.  Visit Diagnosis:  Left hip pain  Difficulty walking  Hamstring tightness of both lower extremities      Subjective Assessment - 04/04/15 0852    Subjective A little sore, and tired.  Got a new bed and I am not sleeping well.   Currently in Pain? Yes   Pain Score 4    Pain Location Back   Pain Orientation Left                         [facility] Adult PT Treatment/Exercise - 04/04/15 0001    Ambulation/Gait   Gait Comments 1 1/2 laps around the building, working on faster speed.  Some shuffling as we went up th [place]hill   Timed Up and Go Test   TUG Normal TUG   Normal TUG (seconds) 14   High Level Balance   High Level Balance Comments standing on airex ball toss, hockey shots   Lumbar Exercises: Stretches  Passive Hamstring Stretch 3 reps;30 seconds   Piriformis Stretch 3 reps;20 seconds   Lumbar Exercises: Aerobic   Tread Mill NuStep L6 x 6 minutes   Lumbar Exercises: Machines for Strengthening   Cybex Knee Extension 10# 2x10   Cybex Knee Flexion 35# 2x15   Other Lumbar Machine Exercise seated row 25#, lats 25#, 5# chest press, 25# straight arm pull downs   Knee/Hip Exercises: Standing   Other Standing Knee Exercises sitting, weighted ball overhead lifts and then side to side   Moist Heat Therapy   Number Minutes Moist Heat 15 Minutes   Moist Heat Location Lumbar Spine   Electrical Stimulation   Electrical Stimulation Location lumbar spine   Electrical Stimulation Action IFC   Electrical Stimulation Parameters tolerance   Electrical Stimulation Goals Pain                  PT Short Term Goals - 03/14/15 1020    PT SHORT TERM GOAL #1   Title independent  with initial HEP   Status Partially Met           PT Long Term Goals - 2015-04-15 1107    PT LONG TERM GOAL #1   Title Understand fall risks, the importance of exercise and flexibility to health and function   Status Achieved   PT LONG TERM GOAL #2   Title increase SLR to 65 degrees   Status Partially Met   PT LONG TERM GOAL #3   Title walk around our building without rest    Status Achieved   PT LONG TERM GOAL #5   Title increase lumbar ROM 25%   Status Partially Met               Plan - 04-15-15 1105    Clinical Impression Statement Overall the TUG has improved, his ability to go around the building at a faster pace and with less rests is better.  He is still very fearful of any dynamic surface standing.     PT Next Visit Plan will continue with the gait and function to help him be more independent and active   Consulted and Agree with Plan of Care Patient          G-Codes - 04-15-2015 1108    Functional Assessment Tool Used foto   Functional Limitation Mobility: Walking and moving around   Mobility: Walking and Moving Around Current Status [phone]) At least 40 percent but less than 60 percent impaired, limited or restricted   Mobility: Walking and Moving Around Goal Status [phone]) At least 40 percent but less than 60 percent impaired, limited or restricted      Problem List Patient Active Problem List   Diagnosis Date Noted  . 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  . Great toe pain 02/11/2014  . Dementia with behavioral disturbance 01/09/2014  . History of right MCA stroke 11/09/2013  . Obesity (BMI 30-39.9) 06/25/2013  . Renal insufficiency 06/25/2013  . Weakness of left arm 06/25/2013  . Aftercare following surgery of the circulatory system, [place] 05/17/2013  . Confusion 04/17/2012  . Sebaceous cyst 03/03/2011  . LUMBAR SPRAIN AND STRAIN 07/31/2010  . URINARY FREQUENCY 09/25/2009  . RIB PAIN, LEFT SIDED 08/29/2009  . DEPRESSION/ANXIETY 08/01/2009  .  CAROTID ARTERY STENOSIS, BILATERAL 05/02/2009  . DIZZINESS 05/02/2009  . HEADACHE 05/02/2009  . ECZEMA, ATOPIC 05/31/2008  . UNSPECIFIED VITAMIN D DEFICIENCY 03/01/2008  . FATIGUE 01/26/2008  . BENIGN PROSTATIC HYPERTROPHY, HX OF 08/06/2007  .  ANXIETY 12/21/2006  . HOARSENESS 12/21/2006  . FASTING HYPERGLYCEMIA 12/21/2006  . Hyperlipidemia LDL goal <100 09/27/2006  . Unspecified cerebral artery occlusion with cerebral infarction 09/27/2006    Sumner Boast., PT 04/04/2015, 11:09 AM  [place], [name], [address] Phone: [phone]   Fax:  [phone]  Name: William [name] MRN: [id number] Date of Birth: 1941-06-12

## 2015-04-08 ENCOUNTER — Encounter: Payer: Self-pay | Admitting: Physical Therapy

## 2015-04-08 ENCOUNTER — Ambulatory Visit: Payer: PPO | Admitting: Physical Therapy

## 2015-04-08 DIAGNOSIS — R262 Difficulty in walking, not elsewhere classified: Secondary | ICD-10-CM

## 2015-04-08 DIAGNOSIS — M629 Disorder of muscle, unspecified: Secondary | ICD-10-CM

## 2015-04-08 DIAGNOSIS — M25552 Pain in left hip: Secondary | ICD-10-CM | POA: Diagnosis not present

## 2015-04-08 NOTE — Therapy (Signed)
[place], [name], [address] Phone: [phone]   Fax:  [phone]  Physical Therapy Treatment  Patient Details  Name: William [name] MRN: [id number] Date of Birth: 23-Dec-1940 No Data Recorded  Encounter Date: 04/08/2015      PT End of Session - 04/08/15 1009    Visit Number 11   PT Start Time 0931   PT Stop Time 1028   PT Time Calculation (min) 57 min   Activity Tolerance Patient tolerated treatment well   Behavior During Therapy [facility] for tasks assessed/performed      Past Medical History  Diagnosis Date  . Hyperlipidemia   . ANXIETY   . Urinary frequency   . BPH (benign prostatic hypertrophy)   . Eczema   . Bladder stone   . History of carotid artery stenosis     S/P BILATERAL CEA  . S/P carotid endarterectomy     BILATERAL ICA--  PATENT PER DUPLEX  05-19-2012  . CVD (cerebrovascular disease)   . Vitamin D deficiency   . 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 GERD (gastroesophageal reflux disease)   . Nocturia   . Stroke [facility]) 1996    left side weakness  . Carotid artery occlusion   . 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 Carotid endarterectomy Bilateral LEFT  11-12-2008  DR GREG HAYES    RIGHT ICA  2006  ([facility])  . Inguinal hernia repair Right 11-08-2006  . Cardiovascular stress test  03-27-2012  DR CRENSHAW    LOW RISK LEXISCAN STUDY-- PROBABLE NORMAL PERFUSION AND SOFT TISSUE ATTENUATION/  NO ISCHEMIA/ EF 51%  . Appendectomy  AS CHILD  . Cystoscopy with litholapaxy N/A 02/26/2013    Procedure: CYSTOSCOPY WITH LITHOLAPAXY;  Surgeon: Franchot Gallo, MD;  Location: [facility];  Service: Urology;  Laterality: N/A;  . Transurethral resection of prostate N/A 02/26/2013    Procedure: TRANSURETHRAL RESECTION OF THE PROSTATE WITH GYRUS INSTRUMENTS;  Surgeon:  Franchot Gallo, MD;  Location: [facility];  Service: Urology;  Laterality: N/A;  . Prostate surgery    . Eye surgery      cataract surgery both eyes  . Mohs surgery Left 1/ 2016    Dr Nevada Crane-- Basal cell    There were no vitals filed for this visit.  Visit Diagnosis:  Left hip pain  Difficulty walking  Hamstring tightness of both lower extremities      Subjective Assessment - 04/08/15 0935    Subjective tired, hip gets sore with walking.   Currently in Pain? Yes   Pain Score 3    Pain Location Hip   Pain Orientation Left   Pain Descriptors / Indicators Aching   Aggravating Factors  walking   Pain Relieving Factors the heat and electric stuff                         [facility] Adult PT Treatment/Exercise - 04/08/15 0001    Ambulation/Gait   Gait Comments 2 laps with one rest, less feet shuffling today   High Level Balance   High Level Balance Comments standing on airex ball toss, hockey shots   Lumbar Exercises: Aerobic   Tread Mill NuStep L6 x 6 minutes   Lumbar Exercises: Machines for Strengthening   Cybex Knee  Extension 10# 2x10   Cybex Knee Flexion 35# 2x15   Other Lumbar Machine Exercise seated row 25#, lats 25#, 5# chest press, 25# straight arm pull downs   Lumbar Exercises: Standing   Other Standing Lumbar Exercises Seated weighted ball lifts and then weighted ball obliques   Moist Heat Therapy   Number Minutes Moist Heat 15 Minutes   Moist Heat Location Lumbar Spine   Electrical Stimulation   Electrical Stimulation Location lumbar spine   Electrical Stimulation Action IFC   Electrical Stimulation Parameters tolerance   Electrical Stimulation Goals Pain                  PT Short Term Goals - 03/14/15 1020    PT SHORT TERM GOAL #1   Title independent with initial HEP   Status Partially Met           PT Long Term Goals - 04/04/15 1107    PT LONG TERM GOAL #1   Title Understand fall risks, the importance of  exercise and flexibility to health and function   Status Achieved   PT LONG TERM GOAL #2   Title increase SLR to 65 degrees   Status Partially Met   PT LONG TERM GOAL #3   Title walk around our building without rest    Status Achieved   PT LONG TERM GOAL #5   Title increase lumbar ROM 25%   Status Partially Met               Plan - 04/08/15 1010    Clinical Impression Statement Patient continues to have fear with any balance activities, he is improving in his ability to do, and has less shuffling with walking up hill.     PT Next Visit Plan will continue with the gait and function to help him be more independent and active   Consulted and Agree with Plan of Care Patient        Problem List Patient Active Problem List   Diagnosis Date Noted  . 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  . Great toe pain 02/11/2014  . Dementia with behavioral disturbance 01/09/2014  . History of right MCA stroke 11/09/2013  . Obesity (BMI 30-39.9) 06/25/2013  . Renal insufficiency 06/25/2013  . Weakness of left arm 06/25/2013  . Aftercare following surgery of the circulatory system, [place] 05/17/2013  . Confusion 04/17/2012  . Sebaceous cyst 03/03/2011  . LUMBAR SPRAIN AND STRAIN 07/31/2010  . URINARY FREQUENCY 09/25/2009  . RIB PAIN, LEFT SIDED 08/29/2009  . DEPRESSION/ANXIETY 08/01/2009  . CAROTID ARTERY STENOSIS, BILATERAL 05/02/2009  . DIZZINESS 05/02/2009  . HEADACHE 05/02/2009  . ECZEMA, ATOPIC 05/31/2008  . UNSPECIFIED VITAMIN D DEFICIENCY 03/01/2008  . FATIGUE 01/26/2008  . BENIGN PROSTATIC HYPERTROPHY, HX OF 08/06/2007  . ANXIETY 12/21/2006  . HOARSENESS 12/21/2006  . FASTING HYPERGLYCEMIA 12/21/2006  . Hyperlipidemia LDL goal <100 09/27/2006  . Unspecified cerebral artery occlusion with cerebral infarction 09/27/2006    Sumner Boast. PT 04/08/2015, 10:13 AM  [place],  [name], [address] Phone: [phone]   Fax:  [phone]  Name: DESMIN DALEO MRN: [id number] Date of Birth: 04-15-1941

## 2015-04-10 ENCOUNTER — Encounter: Payer: Self-pay | Admitting: Physical Therapy

## 2015-04-10 ENCOUNTER — Ambulatory Visit: Payer: PPO | Admitting: Physical Therapy

## 2015-04-10 DIAGNOSIS — M25552 Pain in left hip: Secondary | ICD-10-CM | POA: Diagnosis not present

## 2015-04-10 DIAGNOSIS — R262 Difficulty in walking, not elsewhere classified: Secondary | ICD-10-CM

## 2015-04-10 DIAGNOSIS — M629 Disorder of muscle, unspecified: Secondary | ICD-10-CM

## 2015-04-10 NOTE — Therapy (Signed)
[place], [name], [address] Phone: [phone]   Fax:  [phone]  Physical Therapy Treatment  Patient Details  Name: William [name] MRN: [id number] Date of Birth: [birth year] No Data Recorded  Encounter Date: 04/10/2015      PT End of Session - 04/10/15 1008    Visit Number 12   PT Start Time 0932   PT Stop Time 1030   PT Time Calculation (min) 58 min   Activity Tolerance Patient tolerated treatment well   Behavior During Therapy [facility] for tasks assessed/performed      Past Medical History  Diagnosis Date  . Hyperlipidemia   . ANXIETY   . Urinary frequency   . BPH (benign prostatic hypertrophy)   . Eczema   . Bladder stone   . History of carotid artery stenosis     S/P BILATERAL CEA  . S/P carotid endarterectomy     BILATERAL ICA--  PATENT PER DUPLEX  05-19-2012  . CVD (cerebrovascular disease)   . Vitamin D deficiency   . 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 GERD (gastroesophageal reflux disease)   . Nocturia   . Stroke [facility]) 1996    left side weakness  . Carotid artery occlusion   . 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 Carotid endarterectomy Bilateral LEFT  11-12-2008  DR GREG HAYES    RIGHT ICA  2006  ([facility])  . Inguinal hernia repair Right 11-08-2006  . Cardiovascular stress test  03-27-2012  DR CRENSHAW    LOW RISK LEXISCAN STUDY-- PROBABLE NORMAL PERFUSION AND SOFT TISSUE ATTENUATION/  NO ISCHEMIA/ EF 51%  . Appendectomy  AS CHILD  . Cystoscopy with litholapaxy N/A 02/26/2013    Procedure: CYSTOSCOPY WITH LITHOLAPAXY;  Surgeon: Franchot Gallo, MD;  Location: [facility];  Service: Urology;  Laterality: N/A;  . Transurethral resection of prostate N/A 02/26/2013    Procedure: TRANSURETHRAL RESECTION OF THE PROSTATE WITH GYRUS INSTRUMENTS;  Surgeon:  Franchot Gallo, MD;  Location: [facility];  Service: Urology;  Laterality: N/A;  . Prostate surgery    . Eye surgery      cataract surgery both eyes  . Mohs surgery Left 1/ 2016    Dr Nevada Crane-- Basal cell    There were no vitals filed for this visit.  Visit Diagnosis:  Left hip pain  Difficulty walking  Hamstring tightness of both lower extremities      Subjective Assessment - 04/10/15 0938    Subjective Hips are sore with walking, I am trying to walk more at home.   Currently in Pain? Yes   Pain Score 2    Pain Location Hip   Pain Orientation Left   Pain Descriptors / Indicators Aching;Tightness   Pain Type Chronic pain                         [facility] Adult PT Treatment/Exercise - 04/10/15 0001    Ambulation/Gait   Gait Comments 1 lap around building no rest   High Level Balance   High Level Balance Comments standing on airex ball toss, hockey shots   Lumbar Exercises: Stretches   Passive Hamstring Stretch 3 reps;30 seconds   Piriformis Stretch 4 reps;20 seconds   Lumbar Exercises: Aerobic   Tread Mill NuStep L6  x 6 minutes   Lumbar Exercises: Machines for Strengthening   Cybex Knee Extension 10# 2x10   Cybex Knee Flexion 35# 2x15   Other Lumbar Machine Exercise seated row 25#, lats 25#, 5# chest press, 25# straight arm pull downs   Lumbar Exercises: Standing   Other Standing Lumbar Exercises Seated weighted ball lifts and then weighted ball obliques   Knee/Hip Exercises: Standing   Other Standing Knee Exercises sitting, weighted ball overhead lifts and then side to side   Moist Heat Therapy   Number Minutes Moist Heat 15 Minutes   Moist Heat Location Lumbar Spine   Electrical Stimulation   Electrical Stimulation Location lumbar spine   Electrical Stimulation Action IFC   Electrical Stimulation Parameters tolerance   Electrical Stimulation Goals Pain                  PT Short Term Goals - 03/14/15 1020    PT SHORT  TERM GOAL #1   Title independent with initial HEP   Status Partially Met           PT Long Term Goals - 04/10/15 1012    PT LONG TERM GOAL #1   Title Understand fall risks, the importance of exercise and flexibility to health and function   Status Achieved   PT LONG TERM GOAL #2   Title increase SLR to 65 degrees   Status Partially Met   PT LONG TERM GOAL #3   Title walk around our building without rest    Status Achieved   PT LONG TERM GOAL #4   Title decrease pain with walking by 50%   Status Partially Met   PT LONG TERM GOAL #5   Title increase lumbar ROM 25%   Status Achieved               Plan - 04/10/15 1009    Clinical Impression Statement Patient has improved greatly with his ability to tolerate activity, at first he was unable to walk around our building, he took 4-5 rests for 1 lap, now he can do two laps with 1 rest, he is walking some at home, he is reporting less hip and back pain.  He has a lot of anxiety when we work on balance, especially on dynamic surfaces.   PT Next Visit Plan We are okay to see him for the next 2-3 weeks and would want to continue to work on the balance to make him more independent and safe   Consulted and Agree with Plan of Care Patient        Problem List   Sumner Boast., PT 04/10/2015, 10:13 AM  [place], [name], [address] Phone: [phone]   Fax:  [phone]  Name: William [name] MRN: [id number] Date of Birth: 1940-06-29

## 2015-04-14 ENCOUNTER — Ambulatory Visit: Payer: PPO | Admitting: Physical Therapy

## 2015-04-15 ENCOUNTER — Encounter: Payer: Self-pay | Admitting: Physical Therapy

## 2015-04-15 ENCOUNTER — Ambulatory Visit: Payer: PPO | Attending: Orthopedic Surgery | Admitting: Physical Therapy

## 2015-04-15 DIAGNOSIS — R262 Difficulty in walking, not elsewhere classified: Secondary | ICD-10-CM | POA: Diagnosis present

## 2015-04-15 DIAGNOSIS — M25552 Pain in left hip: Secondary | ICD-10-CM | POA: Diagnosis not present

## 2015-04-15 DIAGNOSIS — M629 Disorder of muscle, unspecified: Secondary | ICD-10-CM | POA: Diagnosis present

## 2015-04-15 NOTE — Therapy (Signed)
[place], [name], [address] Phone: [phone]   Fax:  [phone]  Physical Therapy Treatment  Patient Details  Name: William [name] MRN: [id number] Date of Birth: 08/21/1940 No Data Recorded  Encounter Date: 04/15/2015      PT End of Session - 04/15/15 1137    Visit Number 13   PT Start Time 1056   PT Stop Time 1151   PT Time Calculation (min) 55 min      Past Medical History  Diagnosis Date  . Hyperlipidemia   . ANXIETY   . Urinary frequency   . BPH (benign prostatic hypertrophy)   . Eczema   . Bladder stone   . History of carotid artery stenosis     S/P BILATERAL CEA  . S/P carotid endarterectomy     BILATERAL ICA--  PATENT PER DUPLEX  05-19-2012  . CVD (cerebrovascular disease)   . Vitamin D deficiency   . 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 GERD (gastroesophageal reflux disease)   . Nocturia   . Stroke [facility]) 1996    left side weakness  . Carotid artery occlusion   . 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 Carotid endarterectomy Bilateral LEFT  11-12-2008  DR GREG HAYES    RIGHT ICA  2006  ([facility])  . Inguinal hernia repair Right 11-08-2006  . Cardiovascular stress test  03-27-2012  DR CRENSHAW    LOW RISK LEXISCAN STUDY-- PROBABLE NORMAL PERFUSION AND SOFT TISSUE ATTENUATION/  NO ISCHEMIA/ EF 51%  . Appendectomy  AS CHILD  . Cystoscopy with litholapaxy N/A 02/26/2013    Procedure: CYSTOSCOPY WITH LITHOLAPAXY;  Surgeon: Franchot Gallo, MD;  [facility];  Service: Urology;  Laterality: N/A;  . Transurethral resection of prostate N/A 02/26/2013    Procedure: TRANSURETHRAL RESECTION OF THE PROSTATE WITH GYRUS INSTRUMENTS;  Surgeon: Franchot Gallo, MD;  Location: [facility];  Service: Urology;  Laterality: N/A;  . Prostate  surgery    . Eye surgery      cataract surgery both eyes  . Mohs surgery Left 1/ 2016    Dr Nevada Crane-- Basal cell    There were no vitals filed for this visit.  Visit Diagnosis:  Left hip pain  Difficulty walking  Hamstring tightness of both lower extremities      Subjective Assessment - 04/15/15 1113    Subjective Reported increased soreness after last session. Reports he has been walking everyday at home.    Currently in Pain? Yes   Pain Score 2    Pain Location Hip                         [facility] Adult PT Treatment/Exercise - 04/15/15 0001    Ambulation/Gait   Gait Comments 1 1/2 lap around building no rest   High Level Balance   High Level Balance Comments standing toe taps to 6", hockey shots with airex under one LE   Lumbar Exercises: Aerobic   Tread Mill NuStep L6 x 6 minutes   Lumbar Exercises: Machines for Strengthening   Cybex Knee Extension 10# 2 x12   Cybex Knee Flexion 35# 2x15   Other Lumbar Machine Exercise seated row 25#, lats 25#, 5# chest press, 25# straight arm pull downs   Moist  Heat Therapy   Number Minutes Moist Heat 15 Minutes   Moist Heat Location Lumbar Spine   Electrical Stimulation   Electrical Stimulation Location lumbar spine   Electrical Stimulation Action IFC   Electrical Stimulation Parameters tolerance   Electrical Stimulation Goals Pain                PT Education - 04/15/15 1137    Education provided Yes   Education Details Balance activities and safety    Methods Explanation;Demonstration   Comprehension Verbalized understanding;Returned demonstration          PT Short Term Goals - 03/14/15 1020    PT SHORT TERM GOAL #1   Title independent with initial HEP   Status Partially Met           PT Long Term Goals - 04/10/15 1012    PT LONG TERM GOAL #1   Title Understand fall risks, the importance of exercise and flexibility to health and function   Status Achieved   PT LONG TERM GOAL #2   Title  increase SLR to 65 degrees   Status Partially Met   PT LONG TERM GOAL #3   Title walk around our building without rest    Status Achieved   PT LONG TERM GOAL #4   Title decrease pain with walking by 50%   Status Partially Met   PT LONG TERM GOAL #5   Title increase lumbar ROM 25%   Status Achieved               Plan - 04/15/15 1138    Clinical Impression Statement Pt's edurance is improving, able to walk 1 1/2 laps without any rest breaks. Pt is hesistant but able to complete balance exercises without HHA and dynamic surfaces. Pt enjoys machine resitance exercises.    PT Next Visit Plan Continue to focus on endurance and balance exercises.  Increase difficulty if patient will allow.         Problem List Patient Active Problem List   Diagnosis Date Noted  . 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  . Great toe pain 02/11/2014  . Dementia with behavioral disturbance 01/09/2014  . History of right MCA stroke 11/09/2013  . Obesity (BMI 30-39.9) 06/25/2013  . Renal insufficiency 06/25/2013  . Weakness of left arm 06/25/2013  . Aftercare following surgery of the circulatory system, [place] 05/17/2013  . Confusion 04/17/2012  . Sebaceous cyst 03/03/2011  . LUMBAR SPRAIN AND STRAIN 07/31/2010  . URINARY FREQUENCY 09/25/2009  . RIB PAIN, LEFT SIDED 08/29/2009  . DEPRESSION/ANXIETY 08/01/2009  . CAROTID ARTERY STENOSIS, BILATERAL 05/02/2009  . DIZZINESS 05/02/2009  . HEADACHE 05/02/2009  . ECZEMA, ATOPIC 05/31/2008  . UNSPECIFIED VITAMIN D DEFICIENCY 03/01/2008  . FATIGUE 01/26/2008  . BENIGN PROSTATIC HYPERTROPHY, HX OF 08/06/2007  . ANXIETY 12/21/2006  . HOARSENESS 12/21/2006  . FASTING HYPERGLYCEMIA 12/21/2006  . Hyperlipidemia LDL goal <100 09/27/2006  . Unspecified cerebral artery occlusion with cerebral infarction 09/27/2006    Crist Fat, SPTA 04/15/2015, 11:43 AM  [place]  [place], [name], [address] Phone: [phone]   Fax:  [phone]  Name: William [name] MRN: [id number] Date of Birth: 1941/05/18

## 2015-04-17 ENCOUNTER — Ambulatory Visit: Payer: PPO | Admitting: Physical Therapy

## 2015-04-17 ENCOUNTER — Encounter: Payer: Self-pay | Admitting: Physical Therapy

## 2015-04-17 DIAGNOSIS — M25552 Pain in left hip: Secondary | ICD-10-CM

## 2015-04-17 DIAGNOSIS — M629 Disorder of muscle, unspecified: Secondary | ICD-10-CM

## 2015-04-17 DIAGNOSIS — R262 Difficulty in walking, not elsewhere classified: Secondary | ICD-10-CM

## 2015-04-17 NOTE — Therapy (Signed)
[place], [name], [address] Phone: [phone]   Fax:  [phone]  Physical Therapy Treatment  Patient Details  Name: William [name] MRN: [id number] Date of Birth: 1940/10/20 No Data Recorded  Encounter Date: 04/17/2015      PT End of Session - 04/17/15 1801    Visit Number 14   PT Start Time [id number]   PT Stop Time 1500   PT Time Calculation (min) 57 min   Activity Tolerance Patient tolerated treatment well   Behavior During Therapy William [name] for tasks assessed/performed      Past Medical History  Diagnosis Date  . Hyperlipidemia   . ANXIETY   . Urinary frequency   . BPH (benign prostatic hypertrophy)   . Eczema   . Bladder stone   . History of carotid artery stenosis     S/P BILATERAL CEA  . S/P carotid endarterectomy     BILATERAL ICA--  PATENT PER DUPLEX  05-19-2012  . CVD (cerebrovascular disease)   . Vitamin D deficiency   . 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 GERD (gastroesophageal reflux disease)   . Nocturia   . Stroke [name]) 1996    left side weakness  . Carotid artery occlusion   . 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 Carotid endarterectomy Bilateral LEFT  11-12-2008  DR GREG HAYES    RIGHT ICA  2006  ([facility])  . Inguinal hernia repair Right 11-08-2006  . Cardiovascular stress test  03-27-2012  DR CRENSHAW    LOW RISK LEXISCAN STUDY-- PROBABLE NORMAL PERFUSION AND SOFT TISSUE ATTENUATION/  NO ISCHEMIA/ EF 51%  . Appendectomy  AS CHILD  . Cystoscopy with litholapaxy N/A 02/26/2013    Procedure: CYSTOSCOPY WITH LITHOLAPAXY;  Surgeon: Franchot Gallo, MD;  Location: [facility];  Service: Urology;  Laterality: N/A;  . Transurethral resection of prostate N/A 02/26/2013    Procedure: TRANSURETHRAL RESECTION OF THE PROSTATE WITH GYRUS INSTRUMENTS;  Surgeon:  Franchot Gallo, MD;  Location: [facility];  Service: Urology;  Laterality: N/A;  . Prostate surgery    . Eye surgery      cataract surgery both eyes  . Mohs surgery Left 1/ 2016    Dr Nevada Crane-- Basal cell    There were no vitals filed for this visit.  Visit Diagnosis:  Left hip pain  Difficulty walking  Hamstring tightness of both lower extremities                       [facility] Adult PT Treatment/Exercise - 04/17/15 0001    Ambulation/Gait   Gait Comments 1 1/2 lap around building no rest   High Level Balance   High Level Balance Comments standing toe taps to 6", hockey shots with airex under one LE   Lumbar Exercises: Aerobic   Tread Mill NuStep L6 x 6 minutes   Lumbar Exercises: Machines for Strengthening   Cybex Knee Extension 10# 2 x12   Cybex Knee Flexion 35# 2x15   Leg Press 20# 3x10   Other Lumbar Machine Exercise seated row 25#, lats 25#, 5# chest press, 25# straight arm pull downs   Knee/Hip Exercises: Standing   Other Standing Knee Exercises sitting, weighted ball overhead lifts and then side to side   Moist Heat Therapy  Number Minutes Moist Heat 15 Minutes   Moist Heat Location Lumbar Spine   Electrical Stimulation   Electrical Stimulation Location lumbar spine   Electrical Stimulation Action IFC   Electrical Stimulation Parameters tolerance   Electrical Stimulation Goals Pain                  PT Short Term Goals - 04/17/15 1803    PT SHORT TERM GOAL #1   Title independent with initial HEP   Status Achieved           PT Long Term Goals - 04/17/15 1803    PT LONG TERM GOAL #2   Title increase SLR to 65 degrees   Status Achieved               Plan - 04/17/15 1802    Clinical Impression Statement Fear is his biggest issue, he is afraid to push himself especially on balance tests.  He is stronger and has more endurance, he does still c/o hip pain with walking.   PT Next Visit Plan continue with  balance and exercise to maximize function and independence   Consulted and Agree with Plan of Care Patient        Problem List Patient Active Problem List   Diagnosis Date Noted  . 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  . Great toe pain 02/11/2014  . Dementia with behavioral disturbance 01/09/2014  . History of right MCA stroke 11/09/2013  . Obesity (BMI 30-39.9) 06/25/2013  . Renal insufficiency 06/25/2013  . Weakness of left arm 06/25/2013  . Aftercare following surgery of the circulatory system, [place] 05/17/2013  . Confusion 04/17/2012  . Sebaceous cyst 03/03/2011  . LUMBAR SPRAIN AND STRAIN 07/31/2010  . URINARY FREQUENCY 09/25/2009  . RIB PAIN, LEFT SIDED 08/29/2009  . DEPRESSION/ANXIETY 08/01/2009  . CAROTID ARTERY STENOSIS, BILATERAL 05/02/2009  . DIZZINESS 05/02/2009  . HEADACHE 05/02/2009  . ECZEMA, ATOPIC 05/31/2008  . UNSPECIFIED VITAMIN D DEFICIENCY 03/01/2008  . FATIGUE 01/26/2008  . BENIGN PROSTATIC HYPERTROPHY, HX OF 08/06/2007  . ANXIETY 12/21/2006  . HOARSENESS 12/21/2006  . FASTING HYPERGLYCEMIA 12/21/2006  . Hyperlipidemia LDL goal <100 09/27/2006  . Unspecified cerebral artery occlusion with cerebral infarction 09/27/2006    William [name]., PT 04/17/2015, 6:04 PM  [place], [name], [address] Phone: [phone]   Fax:  [phone]  Name: William [name] MRN: [id number] Date of Birth: Jul 01, 1940

## 2015-04-22 ENCOUNTER — Encounter: Payer: Self-pay | Admitting: Physical Therapy

## 2015-04-22 ENCOUNTER — Ambulatory Visit: Payer: PPO | Admitting: Physical Therapy

## 2015-04-22 DIAGNOSIS — R262 Difficulty in walking, not elsewhere classified: Secondary | ICD-10-CM

## 2015-04-22 DIAGNOSIS — M25552 Pain in left hip: Secondary | ICD-10-CM | POA: Diagnosis not present

## 2015-04-22 DIAGNOSIS — M629 Disorder of muscle, unspecified: Secondary | ICD-10-CM

## 2015-04-22 NOTE — Therapy (Signed)
[facility] [name] [place], [name], [address] Phone: [phone]   Fax:  [phone]  Physical Therapy Treatment  Patient Details  Name: William [name] MRN: [id number] Date of Birth: 07-18-1940 No Data Recorded  Encounter Date: 04/22/2015      PT End of Session - 04/22/15 0934    Visit Number 15   Date for PT Re-Evaluation 05/05/15   PT Start Time 0802   PT Stop Time 0900   PT Time Calculation (min) 58 min   Activity Tolerance Patient tolerated treatment well   Behavior During Therapy William [name] for tasks assessed/performed      Past Medical History  Diagnosis Date  . Hyperlipidemia   . ANXIETY   . Urinary frequency   . BPH (benign prostatic hypertrophy)   . Eczema   . Bladder stone   . History of carotid artery stenosis     S/P BILATERAL CEA  . S/P carotid endarterectomy     BILATERAL ICA--  PATENT PER DUPLEX  05-19-2012  . CVD (cerebrovascular disease)   . Vitamin D deficiency   . 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 GERD (gastroesophageal reflux disease)   . Nocturia   . Stroke [name]) 1996    left side weakness  . Carotid artery occlusion   . 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 Carotid endarterectomy Bilateral LEFT  11-12-2008  DR GREG HAYES    RIGHT ICA  2006  ([facility])  . Inguinal hernia repair Right 11-08-2006  . Cardiovascular stress test  03-27-2012  DR CRENSHAW    LOW RISK LEXISCAN STUDY-- PROBABLE NORMAL PERFUSION AND SOFT TISSUE ATTENUATION/  NO ISCHEMIA/ EF 51%  . Appendectomy  AS CHILD  . Cystoscopy with litholapaxy N/A 02/26/2013    Procedure: CYSTOSCOPY WITH LITHOLAPAXY;  Surgeon: Franchot Gallo, MD;  Location: [facility];  Service: Urology;  Laterality: N/A;  . Transurethral resection of prostate N/A 02/26/2013    Procedure: TRANSURETHRAL RESECTION OF THE  PROSTATE WITH GYRUS INSTRUMENTS;  Surgeon: Franchot Gallo, MD;  Location: [facility];  Service: Urology;  Laterality: N/A;  . Prostate surgery    . Eye surgery      cataract surgery both eyes  . Mohs surgery Left 1/ 2016    Dr Nevada Crane-- Basal cell    There were no vitals filed for this visit.  Visit Diagnosis:  Left hip pain  Difficulty walking  Hamstring tightness of both lower extremities      Subjective Assessment - 04/22/15 0803    Subjective I walked twice over the weekend.     Currently in Pain? Yes   Pain Score 2    Pain Location Hip   Aggravating Factors  walking, increases the pain   Pain Relieving Factors easy walking                         [facility] Adult PT Treatment/Exercise - 04/22/15 0001    High Level Balance   High Level Balance Activities Side stepping;Backward walking;Tandem walking   High Level Balance Comments standing on airex, ball toss, kicks, hockey shots   Lumbar Exercises: Stretches   Passive Hamstring Stretch 3 reps;30 seconds   Piriformis Stretch 4 reps;20 seconds   Piriformis Stretch Limitations Also stretches stretched IR and adductors  Lumbar Exercises: Aerobic   Tread Mill NuStep L6 x 6 minutes   Lumbar Exercises: Machines for Strengthening   Cybex Knee Extension 10# 2 x12   Cybex Knee Flexion 35# 2x15   Leg Press 30# 3x10   Other Lumbar Machine Exercise seated row 25#, lats 25#, 5# chest press, 25# straight arm pull downs   Moist Heat Therapy   Number Minutes Moist Heat 15 Minutes   Moist Heat Location Lumbar Spine   Electrical Stimulation   Electrical Stimulation Location lumbar spine   Electrical Stimulation Action IFC   Electrical Stimulation Parameters tolerance   Electrical Stimulation Goals Pain                  PT Short Term Goals - 04/17/15 1803    PT SHORT TERM GOAL #1   Title independent with initial HEP   Status Achieved           PT Long Term Goals - 04/22/15 0940     PT LONG TERM GOAL #4   Title decrease pain with walking by 50%   Status Partially Met               Plan - 04/22/15 0934    Clinical Impression Statement Patient is understanding of me speaking with him about D/C in the future and what he needs to do, I spoke with him and his wife about the needs for exercise after d/c from [place].  He has made gains in strength, balance and endurance to walk without difficulty.   He is self limiting and that will be his biggest issue going forward.    PT Next Visit Plan continue with balance and exercise to maximize function and independence, plan will be to have him really understand the need for him to exercise on his own.   Consulted and Agree with Plan of Care Patient        Problem List Patient Active Problem List   Diagnosis Date Noted  . 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  . Great toe pain 02/11/2014  . Dementia with behavioral disturbance 01/09/2014  . History of right MCA stroke 11/09/2013  . Obesity (BMI 30-39.9) 06/25/2013  . Renal insufficiency 06/25/2013  . Weakness of left arm 06/25/2013  . Aftercare following surgery of the circulatory system, [place] 05/17/2013  . Confusion 04/17/2012  . Sebaceous cyst 03/03/2011  . LUMBAR SPRAIN AND STRAIN 07/31/2010  . URINARY FREQUENCY 09/25/2009  . RIB PAIN, LEFT SIDED 08/29/2009  . DEPRESSION/ANXIETY 08/01/2009  . CAROTID ARTERY STENOSIS, BILATERAL 05/02/2009  . DIZZINESS 05/02/2009  . HEADACHE 05/02/2009  . ECZEMA, ATOPIC 05/31/2008  . UNSPECIFIED VITAMIN D DEFICIENCY 03/01/2008  . FATIGUE 01/26/2008  . BENIGN PROSTATIC HYPERTROPHY, HX OF 08/06/2007  . ANXIETY 12/21/2006  . HOARSENESS 12/21/2006  . FASTING HYPERGLYCEMIA 12/21/2006  . Hyperlipidemia LDL goal <100 09/27/2006  . Unspecified cerebral artery occlusion with cerebral infarction 09/27/2006    Sumner Boast., PT 04/22/2015, 9:41 AM  [name], [name], [address] Phone: [phone]   Fax:  [phone]  Name: William [name] MRN: [id number] Date of Birth: [birth year]

## 2015-04-24 ENCOUNTER — Ambulatory Visit: Payer: PPO | Admitting: Physical Therapy

## 2015-04-24 ENCOUNTER — Encounter: Payer: Self-pay | Admitting: Physical Therapy

## 2015-04-24 DIAGNOSIS — M629 Disorder of muscle, unspecified: Secondary | ICD-10-CM

## 2015-04-24 DIAGNOSIS — M25552 Pain in left hip: Secondary | ICD-10-CM | POA: Diagnosis not present

## 2015-04-24 DIAGNOSIS — R262 Difficulty in walking, not elsewhere classified: Secondary | ICD-10-CM

## 2015-04-24 NOTE — Therapy (Signed)
[place] [place], [name], [address] Phone: [phone]   Fax:  [phone]  Physical Therapy Treatment  Patient Details  Name: William [name] MRN: [id number] Date of Birth: March 27, 1941 No Data Recorded  Encounter Date: 04/24/2015      PT End of Session - 04/24/15 1102    Visit Number 16   Date for PT Re-Evaluation 05/05/15   PT Start Time 1018   PT Stop Time 1120   PT Time Calculation (min) 62 min   Activity Tolerance Patient tolerated treatment well   Behavior During Therapy [facility] for tasks assessed/performed      Past Medical History  Diagnosis Date  . Hyperlipidemia   . ANXIETY   . Urinary frequency   . BPH (benign prostatic hypertrophy)   . Eczema   . Bladder stone   . History of carotid artery stenosis     S/P BILATERAL CEA  . S/P carotid endarterectomy     BILATERAL ICA--  PATENT PER DUPLEX  05-19-2012  . CVD (cerebrovascular disease)   . Vitamin D deficiency   . 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 GERD (gastroesophageal reflux disease)   . Nocturia   . Stroke [facility]) 1996    left side weakness  . Carotid artery occlusion   . 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 Carotid endarterectomy Bilateral LEFT  11-12-2008  DR GREG HAYES    RIGHT ICA  2006  ([facility])  . Inguinal hernia repair Right 11-08-2006  . Cardiovascular stress test  03-27-2012  DR CRENSHAW    LOW RISK LEXISCAN STUDY-- PROBABLE NORMAL PERFUSION AND SOFT TISSUE ATTENUATION/  NO ISCHEMIA/ EF 51%  . Appendectomy  AS CHILD  . Cystoscopy with litholapaxy N/A 02/26/2013    Procedure: CYSTOSCOPY WITH LITHOLAPAXY;  Surgeon: Franchot Gallo, MD;  Location: [facility];  Service: Urology;  Laterality: N/A;  . Transurethral resection of prostate N/A 02/26/2013    Procedure: TRANSURETHRAL RESECTION OF THE  PROSTATE WITH GYRUS INSTRUMENTS;  Surgeon: Franchot Gallo, MD;  Location: [facility];  Service: Urology;  Laterality: N/A;  . Prostate surgery    . Eye surgery      cataract surgery both eyes  . Mohs surgery Left 1/ 2016    Dr Nevada Crane-- Basal cell    There were no vitals filed for this visit.  Visit Diagnosis:  Left hip pain  Difficulty walking  Hamstring tightness of both lower extremities      Subjective Assessment - 04/24/15 1027    Subjective I have not done too bad, did a walk yesterday.  Back is feeling better.   Currently in Pain? Yes   Pain Score 2    Pain Location Hip   Pain Orientation Left                         [facility] Adult PT Treatment/Exercise - 04/24/15 0001    High Level Balance   High Level Balance Activities Side stepping;Backward walking;Tandem walking   High Level Balance Comments standing on airex, ball toss, kicks, hockey shots   Self-Care   Self-Care Other Self-Care Comments   Other Self-Care Comments  Started going over with him and his wife HEP with bands and ideas to have    Lumbar Exercises: Stretches  Passive Hamstring Stretch 3 reps;30 seconds   Piriformis Stretch 4 reps;20 seconds   Piriformis Stretch Limitations Also stretches stretched IR and adductors   Lumbar Exercises: Aerobic   Tread Mill NuStep L6 x 6 minutes   UBE (Upper Arm Bike) level 5 x 4 minutes   Lumbar Exercises: Machines for Strengthening   Cybex Knee Extension 10# 2 x12   Cybex Knee Flexion 35# 2x15   Leg Press 30# 3x10   Other Lumbar Machine Exercise     Other Lumbar Machine Exercise seated row 25#, lats 25#, 5# chest press, 25# straight arm pull downs   Lumbar Exercises: Standing   Other Standing Lumbar Exercises Seated weighted ball lifts and then weighted ball obliques   Moist Heat Therapy   Number Minutes Moist Heat 15 Minutes   Moist Heat Location Lumbar Spine   Electrical Stimulation   Electrical Stimulation Location lumbar  spine   Electrical Stimulation Action IFC   Electrical Stimulation Parameters tolerance   Electrical Stimulation Goals Pain                  PT Short Term Goals - 04/17/15 1803    PT SHORT TERM GOAL #1   Title independent with initial HEP   Status Achieved           PT Long Term Goals - 04/24/15 1103    PT LONG TERM GOAL #4   Title decrease pain with walking by 50%   Status On-going               Plan - 04/24/15 1103    Clinical Impression Statement Patient and wife were attentive to learning of HEP and how to stay active after D/C.   PT Next Visit Plan continue with balance and exercise to maximize function and independence, plan will be to have him really understand the need for him to exercise on his own.   Consulted and Agree with Plan of Care Patient        Problem List Patient Active Problem List   Diagnosis Date Noted  . 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  . Great toe pain 02/11/2014  . Dementia with behavioral disturbance 01/09/2014  . History of right MCA stroke 11/09/2013  . Obesity (BMI 30-39.9) 06/25/2013  . Renal insufficiency 06/25/2013  . Weakness of left arm 06/25/2013  . Aftercare following surgery of the circulatory system, [place] 05/17/2013  . Confusion 04/17/2012  . Sebaceous cyst 03/03/2011  . LUMBAR SPRAIN AND STRAIN 07/31/2010  . URINARY FREQUENCY 09/25/2009  . RIB PAIN, LEFT SIDED 08/29/2009  . DEPRESSION/ANXIETY 08/01/2009  . CAROTID ARTERY STENOSIS, BILATERAL 05/02/2009  . DIZZINESS 05/02/2009  . HEADACHE 05/02/2009  . ECZEMA, ATOPIC 05/31/2008  . UNSPECIFIED VITAMIN D DEFICIENCY 03/01/2008  . FATIGUE 01/26/2008  . BENIGN PROSTATIC HYPERTROPHY, HX OF 08/06/2007  . ANXIETY 12/21/2006  . HOARSENESS 12/21/2006  . FASTING HYPERGLYCEMIA 12/21/2006  . Hyperlipidemia LDL goal <100 09/27/2006  . Unspecified cerebral artery occlusion with cerebral infarction 09/27/2006    Sumner Boast., PT 04/24/2015, 11:05  AM  [place], [name], [address] Phone: [phone]   Fax:  [phone]  Name: William [name] MRN: [id number] Date of Birth: 12-31-1940

## 2015-04-25 ENCOUNTER — Encounter: Payer: Self-pay | Admitting: Family Medicine

## 2015-04-25 MED ORDER — ROSUVASTATIN CALCIUM 40 MG PO TABS: 40.0000 mg | ORAL_TABLET | Freq: Every day | ORAL | Status: DC

## 2015-04-29 ENCOUNTER — Ambulatory Visit: Payer: PPO | Admitting: Physical Therapy

## 2015-04-29 ENCOUNTER — Encounter: Payer: Self-pay | Admitting: Physical Therapy

## 2015-04-29 DIAGNOSIS — M25552 Pain in left hip: Secondary | ICD-10-CM

## 2015-04-29 DIAGNOSIS — M629 Disorder of muscle, unspecified: Secondary | ICD-10-CM

## 2015-04-29 DIAGNOSIS — R262 Difficulty in walking, not elsewhere classified: Secondary | ICD-10-CM

## 2015-04-29 NOTE — Therapy (Signed)
[place], [name], [address] Phone: [phone]   Fax:  [phone]  Physical Therapy Treatment  Patient Details  Name: William [name] MRN: [id number] Date of Birth: [birth year] No Data Recorded  Encounter Date: 04/29/2015      PT End of Session - 04/29/15 1143    Visit Number 17   Date for PT Re-Evaluation 05/05/15   PT Start Time 1100   PT Stop Time 1200   PT Time Calculation (min) 60 min   Activity Tolerance Patient tolerated treatment well   Behavior During Therapy [facility] for tasks assessed/performed      Past Medical History  Diagnosis Date  . Hyperlipidemia   . ANXIETY   . Urinary frequency   . BPH (benign prostatic hypertrophy)   . Eczema   . Bladder stone   . History of carotid artery stenosis     S/P BILATERAL CEA  . S/P carotid endarterectomy     BILATERAL ICA--  PATENT PER DUPLEX  05-19-2012  . CVD (cerebrovascular disease)   . Vitamin D deficiency   . 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 GERD (gastroesophageal reflux disease)   . Nocturia   . Stroke [facility]) 1996    left side weakness  . Carotid artery occlusion   . 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 Carotid endarterectomy Bilateral LEFT  11-12-2008  DR GREG HAYES    RIGHT ICA  2006  ([facility])  . Inguinal hernia repair Right 11-08-2006  . Cardiovascular stress test  03-27-2012  DR CRENSHAW    LOW RISK LEXISCAN STUDY-- PROBABLE NORMAL PERFUSION AND SOFT TISSUE ATTENUATION/  NO ISCHEMIA/ EF 51%  . Appendectomy  AS CHILD  . Cystoscopy with litholapaxy N/A 02/26/2013    Procedure: CYSTOSCOPY WITH LITHOLAPAXY;  Surgeon: Franchot Gallo, MD;  Location: [facility];  Service: Urology;  Laterality: N/A;  . Transurethral resection of prostate N/A 02/26/2013    Procedure: TRANSURETHRAL RESECTION OF THE  PROSTATE WITH GYRUS INSTRUMENTS;  Surgeon: Franchot Gallo, MD;  Location: [facility];  Service: Urology;  Laterality: N/A;  . Prostate surgery    . Eye surgery      cataract surgery both eyes  . Mohs surgery Left 1/ 2016    Dr Nevada Crane-- Basal cell    There were no vitals filed for this visit.  Visit Diagnosis:  Left hip pain  Difficulty walking  Hamstring tightness of both lower extremities      Subjective Assessment - 04/29/15 1110    Subjective I did walk a little over the weekend.  I get short of breath fast and my hips hurt and back hurt the longer I walk.   Currently in Pain? Yes   Pain Score 4    Pain Location Back   Pain Orientation Right;Left                         [facility] Adult PT Treatment/Exercise - 04/29/15 0001    Ambulation/Gait   Gait Comments 2 laps outside, needed one rest and on the last little bit reports that the LBP and hips start to hurt.  Toward the end he also drags hips.     Timed Up and Go Test   TUG Normal TUG   Normal TUG (seconds)  12   Lumbar Exercises: Stretches   Passive Hamstring Stretch 3 reps;30 seconds   Piriformis Stretch 4 reps;20 seconds   Lumbar Exercises: Aerobic   Tread Mill NuStep L6 x 6 minutes   UBE (Upper Arm Bike) level 5 x 4 minutes   Lumbar Exercises: Machines for Strengthening   Cybex Knee Extension 10# 2 x12   Cybex Knee Flexion 35# 2x15   Leg Press 30# 3x10   Other Lumbar Machine Exercise seated row 25#, lats 25#, 5# chest press, 25# straight arm pull downs   Lumbar Exercises: Standing   Other Standing Lumbar Exercises Seated weighted ball lifts and then weighted ball obliques   Electrical Stimulation   Electrical Stimulation Location lumbar spine   Electrical Stimulation Action IFC   Electrical Stimulation Parameters tolerance   Electrical Stimulation Goals Pain                  PT Short Term Goals - 04/17/15 1803    PT SHORT TERM GOAL #1   Title independent with  initial HEP   Status Achieved           PT Long Term Goals - 04/24/15 1103    PT LONG TERM GOAL #4   Title decrease pain with walking by 50%   Status On-going               Plan - 04/29/15 1143    Clinical Impression Statement Patient with pain in hips and back with longer walking, also will shuffle feet as he fatigues.  Gave handouts about HEP and balance activities for HEP to him and his wife.   PT Next Visit Plan D/C next visit   Consulted and Agree with Plan of Care Patient        Problem List Patient Active Problem List   Diagnosis Date Noted  . 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  . Great toe pain 02/11/2014  . Dementia with behavioral disturbance 01/09/2014  . History of right MCA stroke 11/09/2013  . Obesity (BMI 30-39.9) 06/25/2013  . Renal insufficiency 06/25/2013  . Weakness of left arm 06/25/2013  . Aftercare following surgery of the circulatory system, [place] 05/17/2013  . Confusion 04/17/2012  . Sebaceous cyst 03/03/2011  . LUMBAR SPRAIN AND STRAIN 07/31/2010  . URINARY FREQUENCY 09/25/2009  . RIB PAIN, LEFT SIDED 08/29/2009  . DEPRESSION/ANXIETY 08/01/2009  . CAROTID ARTERY STENOSIS, BILATERAL 05/02/2009  . DIZZINESS 05/02/2009  . HEADACHE 05/02/2009  . ECZEMA, ATOPIC 05/31/2008  . UNSPECIFIED VITAMIN D DEFICIENCY 03/01/2008  . FATIGUE 01/26/2008  . BENIGN PROSTATIC HYPERTROPHY, HX OF 08/06/2007  . ANXIETY 12/21/2006  . HOARSENESS 12/21/2006  . FASTING HYPERGLYCEMIA 12/21/2006  . Hyperlipidemia LDL goal <100 09/27/2006  . Unspecified cerebral artery occlusion with cerebral infarction 09/27/2006    Sumner Boast., PT 04/29/2015, 11:45 AM  [place], [name], [address] Phone: [phone]   Fax:  [phone]  Name: KHOL HEINLE MRN: [id number] Date of Birth: Nov 09, 1940

## 2015-05-01 ENCOUNTER — Ambulatory Visit: Payer: PPO | Admitting: Physical Therapy

## 2015-05-01 ENCOUNTER — Encounter: Payer: Self-pay | Admitting: Physical Therapy

## 2015-05-01 DIAGNOSIS — M25552 Pain in left hip: Secondary | ICD-10-CM

## 2015-05-01 DIAGNOSIS — R262 Difficulty in walking, not elsewhere classified: Secondary | ICD-10-CM

## 2015-05-01 DIAGNOSIS — M629 Disorder of muscle, unspecified: Secondary | ICD-10-CM

## 2015-05-01 NOTE — Therapy (Signed)
[place], [name], [address] Phone: [phone]   Fax:  [phone]  Physical Therapy Treatment  Patient Details  Name: William [name] MRN: [id number] Date of Birth: [birth year] No Data Recorded  Encounter Date: 05/01/2015      PT End of Session - 05/01/15 1209    Visit Number 18   PT Start Time 1100   PT Stop Time 1155   PT Time Calculation (min) 55 min   Activity Tolerance Patient tolerated treatment well   Behavior During Therapy [facility] for tasks assessed/performed      Past Medical History  Diagnosis Date  . Hyperlipidemia   . ANXIETY   . Urinary frequency   . BPH (benign prostatic hypertrophy)   . Eczema   . Bladder stone   . History of carotid artery stenosis     S/P BILATERAL CEA  . S/P carotid endarterectomy     BILATERAL ICA--  PATENT PER DUPLEX  05-19-2012  . CVD (cerebrovascular disease)   . Vitamin D deficiency   . 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 GERD (gastroesophageal reflux disease)   . Nocturia   . Stroke [facility]) 1996    left side weakness  . Carotid artery occlusion   . 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 Carotid endarterectomy Bilateral LEFT  11-12-2008  DR GREG HAYES    RIGHT ICA  2006  ([facility])  . Inguinal hernia repair Right 11-08-2006  . Cardiovascular stress test  03-27-2012  DR CRENSHAW    LOW RISK LEXISCAN STUDY-- PROBABLE NORMAL PERFUSION AND SOFT TISSUE ATTENUATION/  NO ISCHEMIA/ EF 51%  . Appendectomy  AS CHILD  . Cystoscopy with litholapaxy N/A 02/26/2013    Procedure: CYSTOSCOPY WITH LITHOLAPAXY;  Surgeon: Franchot Gallo, MD;  Location: [facility];  Service: Urology;  Laterality: N/A;  . Transurethral resection of prostate N/A 02/26/2013    Procedure: TRANSURETHRAL RESECTION OF THE PROSTATE WITH GYRUS INSTRUMENTS;  Surgeon:  Franchot Gallo, MD;  Location: [facility];  Service: Urology;  Laterality: N/A;  . Prostate surgery    . Eye surgery      cataract surgery both eyes  . Mohs surgery Left 1/ 2016    Dr Nevada Crane-- Basal cell    There were no vitals filed for this visit.  Visit Diagnosis:  Left hip pain  Difficulty walking  Hamstring tightness of both lower extremities      Subjective Assessment - 05/01/15 1202    Subjective I tried the exercises, some of them caused some back pain, I have been trying to walk almost daily.   Currently in Pain? Yes   Pain Score 2    Pain Location Back                         [facility] Adult PT Treatment/Exercise - 05/01/15 0001    Ambulation/Gait   Gait Comments 2 laps outside, needed one rest and on the last little bit reports that the LBP and hips start to hurt.  Toward the end he also drags hips.     Self-Care   Self-Care Other Self-Care Comments   Other Self-Care Comments  Went over with patient and spouse the HEP that was given last treatmentthat included LE and UE exercises as well as  balance activities, request to continue walking and to use the left arm, they all agreed and we went over how to adjust a few of the exercises to decrease his back pain, we did this and he was able to perform without pain   Lumbar Exercises: Aerobic   Tread Mill NuStep L6 x 6 minutes   Lumbar Exercises: Machines for Strengthening   Leg Press 40# 2x15   Other Lumbar Machine Exercise seated row 25#, lats 25#, 5# chest press, 25# straight arm pull downs   Lumbar Exercises: Standing   Other Standing Lumbar Exercises Seated weighted ball lifts and then weighted ball obliques   Electrical Stimulation   Electrical Stimulation Location lumbar spine   Electrical Stimulation Action IFC   Electrical Stimulation Parameters tolerance   Electrical Stimulation Goals Pain                  PT Short Term Goals - 2015/05/02 1211    PT SHORT TERM GOAL #1    Title independent with initial HEP   Status Achieved           PT Long Term Goals - 05-02-15 1211    PT LONG TERM GOAL #1   Title Understand fall risks, the importance of exercise and flexibility to health and function   Status Achieved   PT LONG TERM GOAL #2   Title increase SLR to 65 degrees   Status Achieved   PT LONG TERM GOAL #3   Title walk around our building without rest    Status Achieved   PT LONG TERM GOAL #4   Title decrease pain with walking by 50%   Status Achieved               Plan - 05-02-15 1209    Clinical Impression Statement Patient shows increase strength of the hips and knees, he has increased endurance, increased balance, decreased his TUG time and risk for falls.  He does still get winded with activity, he has some hip pain with walking and will tend to hold the left UE in a gaurded position.   PT Next Visit Plan d/c   Consulted and Agree with Plan of Care Patient          G-Codes - 2015/05/02 1201    Functional Assessment Tool Used foto   Functional Limitation Mobility: Walking and moving around   Mobility: Walking and Moving Around Current Status [phone]) At least 20 percent but less than 40 percent impaired, limited or restricted   Mobility: Walking and Moving Around Goal Status [phone]) At least 40 percent but less than 60 percent impaired, limited or restricted   Mobility: Walking and Moving Around Discharge Status [phone]) At least 20 percent but less than 40 percent impaired, limited or restricted      Problem List Patient Active Problem List   Diagnosis Date Noted  . 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  . Great toe pain 02/11/2014  . Dementia with behavioral disturbance 01/09/2014  . History of right MCA stroke 11/09/2013  . Obesity (BMI 30-39.9) 06/25/2013  . Renal insufficiency 06/25/2013  . Weakness of left arm 06/25/2013  . Aftercare following surgery of the circulatory system, [place] 05/17/2013  . Confusion 04/17/2012   . Sebaceous cyst 03/03/2011  . LUMBAR SPRAIN AND STRAIN 07/31/2010  . URINARY FREQUENCY 09/25/2009  . RIB PAIN, LEFT SIDED 08/29/2009  . DEPRESSION/ANXIETY 08/01/2009  . CAROTID ARTERY STENOSIS, BILATERAL 05/02/2009  . DIZZINESS 05/02/2009  . HEADACHE 05/02/2009  .  ECZEMA, ATOPIC 05/31/2008  . UNSPECIFIED VITAMIN D DEFICIENCY 03/01/2008  . FATIGUE 01/26/2008  . BENIGN PROSTATIC HYPERTROPHY, HX OF 08/06/2007  . ANXIETY 12/21/2006  . HOARSENESS 12/21/2006  . FASTING HYPERGLYCEMIA 12/21/2006  . Hyperlipidemia LDL goal <100 09/27/2006  . Unspecified cerebral artery occlusion with cerebral infarction 09/27/2006    Sumner Boast., PT 05/01/2015, 12:14 PM  [place], [name], [address] Phone: [phone]   Fax:  [phone]  Name: William [name] MRN: [id number] Date of Birth: 06-06-1941

## 2015-05-13 ENCOUNTER — Encounter: Payer: Self-pay | Admitting: Family Medicine

## 2015-05-13 DIAGNOSIS — S62309A Unspecified fracture of unspecified metacarpal bone, initial encounter for closed fracture: Secondary | ICD-10-CM

## 2015-05-14 ENCOUNTER — Encounter: Payer: Self-pay | Admitting: Medical

## 2015-05-14 ENCOUNTER — Encounter: Payer: Self-pay | Admitting: Family Medicine

## 2015-05-14 ENCOUNTER — Ambulatory Visit (HOSPITAL_BASED_OUTPATIENT_CLINIC_OR_DEPARTMENT_OTHER)
Admission: RE | Admit: 2015-05-14 | Discharge: 2015-05-14 | Disposition: A | Discharge status: Home / Self Care | Payer: PPO | Source: Ambulatory Visit | Attending: Medical | Admitting: Medical

## 2015-05-14 ENCOUNTER — Ambulatory Visit (INDEPENDENT_AMBULATORY_CARE_PROVIDER_SITE_OTHER): Payer: PPO | Admitting: Family Medicine

## 2015-05-14 ENCOUNTER — Ambulatory Visit (INDEPENDENT_AMBULATORY_CARE_PROVIDER_SITE_OTHER): Payer: PPO | Admitting: Medical

## 2015-05-14 VITALS — BP 120/80 | HR 76 | Temp 97.6°F | Ht 71.0 in | Wt 238.0 lb

## 2015-05-14 VITALS — BP 149/86 | HR 67 | Ht 71.0 in | Wt 238.0 lb

## 2015-05-14 DIAGNOSIS — S62397A Other fracture of fifth metacarpal bone, left hand, initial encounter for closed fracture: Secondary | ICD-10-CM | POA: Insufficient documentation

## 2015-05-14 DIAGNOSIS — S62308A Unspecified fracture of other metacarpal bone, initial encounter for closed fracture: Secondary | ICD-10-CM | POA: Diagnosis not present

## 2015-05-14 DIAGNOSIS — M25572 Pain in left ankle and joints of left foot: Secondary | ICD-10-CM

## 2015-05-14 DIAGNOSIS — W19XXXA Unspecified fall, initial encounter: Secondary | ICD-10-CM | POA: Insufficient documentation

## 2015-05-14 DIAGNOSIS — R0781 Pleurodynia: Secondary | ICD-10-CM

## 2015-05-14 DIAGNOSIS — M79672 Pain in left foot: Secondary | ICD-10-CM | POA: Insufficient documentation

## 2015-05-14 DIAGNOSIS — M7989 Other specified soft tissue disorders: Secondary | ICD-10-CM | POA: Diagnosis not present

## 2015-05-14 DIAGNOSIS — M79642 Pain in left hand: Secondary | ICD-10-CM | POA: Diagnosis not present

## 2015-05-14 MED ORDER — TRAMADOL HCL 50 MG PO TABS: 50.0000 mg | ORAL_TABLET | Freq: Three times a day (TID) | ORAL | Status: DC | PRN

## 2015-05-14 NOTE — Progress Notes (Signed)
Subjective:    Patient ID: William [name], male    DOB: 29-May-1941, 74 y.o.   MRN: [id number]  HPI  Pt in in states he has left hand pain, left rib pain and left foot pain. Fall occurred on Sunday. Wife described that door gave way as he exited. And describes husband as little clumsy.  Pt got out of tram and exited. Somehow hit hand and injured ribs and left foot. Pt hand pain is the worst.  Pt wife gave him tramadol. Pain is controlled.   Review of Systems  Constitutional: Negative for fever, chills and fatigue.  Respiratory: Negative for cough, chest tightness, shortness of breath and wheezing.   Cardiovascular: Negative for chest pain and palpitations.  Musculoskeletal:       Lt hand, rib and foot/ankle pain.  Hematological: Negative for adenopathy. Does not bruise/bleed easily.  Psychiatric/Behavioral: Negative for behavioral problems and confusion.   Past Medical History  Diagnosis Date  . Hyperlipidemia   . ANXIETY   . Urinary frequency   . BPH (benign prostatic hypertrophy)   . Eczema   . Bladder stone   . History of carotid artery stenosis     S/P BILATERAL CEA  . S/P carotid endarterectomy     BILATERAL ICA--  PATENT PER DUPLEX  05-19-2012  . CVD (cerebrovascular disease)   . Vitamin D deficiency   . 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 GERD (gastroesophageal reflux disease)   . Nocturia   . Stroke [facility]) 1996    left side weakness  . Carotid artery occlusion   . 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 Marital Status: Married    Spouse Name: N/A  . Number of Children: 2  . Years of Education: N/A   Occupational History  .     Social History Main Topics  . Smoking status: Former Smoker -- 2.00 packs/day for 40 years    Types: Cigarettes    Quit date: 02/15/2005  . Smokeless tobacco: Never Used  . Alcohol Use: 0.0 oz/week     Comment: Occasional  . Drug Use: No    . Sexual Activity:    Partners: Female   Other Topics Concern  . Not on file   Social History Narrative   Exercise--  Walks dogs everyday    Past Surgical History  Procedure Laterality Date  . Carotid endarterectomy Bilateral LEFT  11-12-2008  DR GREG HAYES    RIGHT ICA  2006  ([facility])  . Inguinal hernia repair Right 11-08-2006  . Cardiovascular stress test  03-27-2012  DR CRENSHAW    LOW RISK LEXISCAN STUDY-- PROBABLE NORMAL PERFUSION AND SOFT TISSUE ATTENUATION/  NO ISCHEMIA/ EF 51%  . Appendectomy  AS CHILD  . Cystoscopy with litholapaxy N/A 02/26/2013    Procedure: CYSTOSCOPY WITH LITHOLAPAXY;  Surgeon: Franchot Gallo, MD;  Location: [facility];  Service: Urology;  Laterality: N/A;  . Transurethral resection of prostate N/A 02/26/2013    Procedure: TRANSURETHRAL RESECTION OF THE PROSTATE WITH GYRUS INSTRUMENTS;  Surgeon: Franchot Gallo, MD;  Location: [facility];  Service: Urology;  Laterality: N/A;  . Prostate surgery    . Eye surgery      cataract surgery both eyes  . Mohs surgery Left 1/ 2016    Dr Nevada Crane-- Basal cell    Family History  Problem Relation Age of  Onset  . Heart disease Mother     CHF  . Bipolar disorder Mother   . Heart disease Father     CHF    Allergies  Allergen Reactions  . Adhesive [Tape] Other (See Comments)    BLISTER    Current Outpatient Prescriptions on File Prior to Visit  Medication Sig Dispense Refill  . aspirin 325 MG EC tablet Take 325 mg by mouth daily.    . B Complex Vitamins (VITAMIN B COMPLEX PO) Take by mouth daily.      . Calcium Carb-Cholecalciferol (CALCIUM 1000 + D PO) Take by mouth daily.      . carbamazepine (TEGRETOL-XR) 100 MG 12 hr tablet 1 qam  1  qhs 60 tablet 8  . Cholecalciferol (VITAMIN D) 2000 UNITS CAPS Take by mouth.    . esomeprazole (NEXIUM) 40 MG capsule Take 1 capsule (40 mg total) by mouth as needed. 90 capsule 3  . fenofibrate 160 MG tablet Take 1 tablet (160 mg  total) by mouth daily. 90 tablet 1  . Flaxseed, Linseed, (FLAX SEED OIL PO) Take by mouth daily.      [name] LORazepam (ATIVAN) 0.5 MG tablet 2 bid 120 tablet 5  . Multiple Vitamin (MULTIVITAMIN) tablet Take 1 tablet by mouth daily.    . rosuvastatin (CRESTOR) 40 MG tablet Take 1 tablet (40 mg total) by mouth daily. 90 tablet 3  . zolpidem (AMBIEN) 10 MG tablet Take 1 tablet (10 mg total) by mouth at bedtime as needed for sleep. 30 tablet 5   No current facility-administered medications on file prior to visit.    BP 120/80 mmHg  Pulse 76  Temp(Src) 97.6 F (36.4 C) (Oral)  Ht 5\' 11"  (1.803 m)  Wt 238 lb (107.956 kg)  BMI 33.21 kg/m2  SpO2 94%       Objective:   Physical Exam  General- No acute distress. Pleasant patient.  Lt wrist- no tenderness. Good rom. Lt hand- dorsal aspect swelling bruising. Pain over all metacarpals. Worse over 4th and 5th. Some pain over scaphoid bone. Lt ankle- moderate lateral malleolus tenderness. Lt foot- mild swelling and pain of 4th and 5th metatarsal.     Assessment & Plan:  For your areas of pain will get xrays. Rx tramadol for pain.   Will refer to orthopedist if fx of hand, ankle or foot.  Will advise on splinting brace  /splint use if no fracture seen. It is extremely important to follow up in 7-10 days to recheck your hand. If you have scaphoid area tenderness even with negative xrays may need to refer to orthopedist.

## 2015-05-14 NOTE — Telephone Encounter (Signed)
Please refer stat

## 2015-05-14 NOTE — Patient Instructions (Addendum)
For your areas of pain will get xrays. Rx tramadol for pain.   Will refer to orthopedist if fx of hand, ankle or foot.  Will advise on splinting brace  /splint use if no fracture seen. It is extremely important to follow up in 7-10 days to recheck your hand. If you have scaphoid area tenderness even with negative xrays may need to refer to orthopedist.

## 2015-05-14 NOTE — Progress Notes (Signed)
Pre visit review using our clinic review tool, if applicable. No additional management support is needed unless otherwise documented below in the visit note. 

## 2015-05-15 ENCOUNTER — Other Ambulatory Visit (HOSPITAL_COMMUNITY): Payer: Self-pay | Admitting: Psychiatry

## 2015-05-15 DIAGNOSIS — S62308A Unspecified fracture of other metacarpal bone, initial encounter for closed fracture: Secondary | ICD-10-CM

## 2015-05-15 HISTORY — DX: Unspecified fracture of other metacarpal bone, initial encounter for closed fracture: S62.308A

## 2015-05-15 NOTE — Progress Notes (Signed)
PCP: Garnet Koyanagi, DO Consultation requested by Mackie Pai PA-C  Subjective:   HPI: Patient is a 74 y.o. male here for left hand injury.  Patient reports on 11/27 he injured his left hand on ulnar side when his hand was on a door that gave way. Hand twisted - also struck his chest and left foot. Pain in right hand is 3/10 now, sharp. + swelling and bruising. No prior injuries. Worse with any hand or finger motions. Referred to [place] for splinting - has appointment with Dr. Burney Gauze tomorrow. No skin changes, fever otherwise. Right handed.  Past Medical History  Diagnosis Date  . Hyperlipidemia   . ANXIETY   . Urinary frequency   . BPH (benign prostatic hypertrophy)   . Eczema   . Bladder stone   . History of carotid artery stenosis     S/P BILATERAL CEA  . S/P carotid endarterectomy     BILATERAL ICA--  PATENT PER DUPLEX  05-19-2012  . CVD (cerebrovascular disease)   . Vitamin D deficiency   . 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 GERD (gastroesophageal reflux disease)   . Nocturia   . Stroke [facility]) 1996    left side weakness  . Carotid artery occlusion   . 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 aspirin 325 MG EC tablet Take 325 mg by mouth daily.    . B Complex Vitamins (VITAMIN B COMPLEX PO) Take by mouth daily.      . Calcium Carb-Cholecalciferol (CALCIUM 1000 + D PO) Take by mouth daily.      . carbamazepine (TEGRETOL-XR) 100 MG 12 hr tablet 1 qam  1  qhs 60 tablet 8  . Cholecalciferol (VITAMIN D) 2000 UNITS CAPS Take by mouth.    . esomeprazole (NEXIUM) 40 MG capsule Take 1 capsule (40 mg total) by mouth as needed. 90 capsule 3  . fenofibrate 160 MG tablet Take 1 tablet (160 mg total) by mouth daily. 90 tablet 1  . Flaxseed, Linseed, (FLAX SEED OIL PO) Take by mouth daily.      [name] LORazepam (ATIVAN) 0.5 MG tablet 2 bid 120  tablet 5  . Multiple Vitamin (MULTIVITAMIN) tablet Take 1 tablet by mouth daily.    . rosuvastatin (CRESTOR) 40 MG tablet Take 1 tablet (40 mg total) by mouth daily. 90 tablet 3  . traMADol (ULTRAM) 50 MG tablet Take 1 tablet (50 mg total) by mouth every 8 (eight) hours as needed. 20 tablet 0  . zolpidem (AMBIEN) 10 MG tablet Take 1 tablet (10 mg total) by mouth at bedtime as needed for sleep. 30 tablet 5   No current facility-administered medications on file prior to visit.    Past Surgical History  Procedure Laterality Date  . Carotid endarterectomy Bilateral LEFT  11-12-2008  DR GREG HAYES    RIGHT ICA  2006  ([facility])  . Inguinal hernia repair Right 11-08-2006  . Cardiovascular stress test  03-27-2012  DR CRENSHAW    LOW RISK LEXISCAN STUDY-- PROBABLE NORMAL PERFUSION AND SOFT TISSUE ATTENUATION/  NO ISCHEMIA/ EF 51%  . Appendectomy  AS CHILD  . Cystoscopy with litholapaxy N/A 02/26/2013    Procedure: CYSTOSCOPY WITH LITHOLAPAXY;  Surgeon: Franchot Gallo, MD;  Location: [facility];  Service: Urology;  Laterality: N/A;  . Transurethral resection of prostate N/A 02/26/2013  Procedure: TRANSURETHRAL RESECTION OF THE PROSTATE WITH GYRUS INSTRUMENTS;  Surgeon: Franchot Gallo, MD;  Location: [facility];  Service: Urology;  Laterality: N/A;  . Prostate surgery    . Eye surgery      cataract surgery both eyes  . 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 Adhesive [Tape] Other (See Comments)    BLISTER    Social History   Social History  . Marital Status: Married    Spouse Name: N/A  . Number of Children: 2  . Years of Education: N/A   Occupational History  .     Social History Main Topics  . Smoking status: Former Smoker -- 2.00 packs/day for 40 years    Types: Cigarettes    Quit date: 02/15/2005  . Smokeless tobacco: Never Used  . Alcohol Use: 0.0 oz/week    0 Standard drinks or equivalent  per week     Comment: Occasional  . Drug Use: No  . Sexual Activity:    Partners: Female   Other Topics Concern  . Not on file   Social History Narrative   Exercise--  Walks dogs everyday    Family History  Problem Relation Age of Onset  . Heart disease Mother     CHF  . Bipolar disorder Mother   . Heart disease Father     CHF    BP 149/86 mmHg  Pulse 67  Ht 5\' 11"  (1.803 m)  Wt 238 lb (107.956 kg)  BMI 33.21 kg/m2  Review of Systems: See HPI above.    Objective:  Physical Exam:  Gen: NAD  Left hand: Mod swelling, bruising ulnar aspect of left hand.   TTP 5th metacarpal, digit, less 4th metacarpal. Limited ROM at 5th MCP, PIP, and DIP but able to flex and extend at these digits. NVI distally.  Right hand: FROM digits without pain, weakness.  Assessment & Plan:  1. Left 5th metacarpal fracture - comminuted of neck and distal shaft.  Seeing hand surgery tomorrow.  Placed in ulnar gutter splint today.  Icing, elevation.  F/u prn with [place].

## 2015-05-15 NOTE — Assessment & Plan Note (Signed)
comminuted of neck and distal shaft.  Seeing hand surgery tomorrow.  Placed in ulnar gutter splint today.  Icing, elevation.  F/u prn with [place].

## 2015-05-19 ENCOUNTER — Telehealth (HOSPITAL_COMMUNITY): Payer: Self-pay

## 2015-05-19 ENCOUNTER — Other Ambulatory Visit (HOSPITAL_COMMUNITY): Payer: Self-pay | Admitting: Psychiatry

## 2015-05-19 NOTE — Telephone Encounter (Signed)
Medication management - Patient's wife presented with refill request for Lorazepam. Called pt. who verified okay to speak with Ms. Abrahamsen.  [place] to verify if they brought in order from 01/29/15 they could still use.  Pam, pharmacist from [facility] verified they could fill the medication up to 6 months from the time it was written and Ms. Bernson agreed to take in order on their refrigerator to fill.  Reminded of patient's next appointment on 05/28/15 with Dr. Casimiro Needle.

## 2015-05-20 ENCOUNTER — Telehealth (HOSPITAL_COMMUNITY): Payer: Self-pay | Admitting: *Deleted

## 2015-05-20 ENCOUNTER — Encounter: Payer: Self-pay | Admitting: Family

## 2015-05-20 NOTE — Telephone Encounter (Signed)
Prior authorization for Zolpidem received. Submitted online with cover my meds. Awaiting decision to be faxed.

## 2015-05-21 ENCOUNTER — Other Ambulatory Visit: Payer: Self-pay | Admitting: *Deleted

## 2015-05-21 DIAGNOSIS — I6523 Occlusion and stenosis of bilateral carotid arteries: Secondary | ICD-10-CM

## 2015-05-21 DIAGNOSIS — Z48812 Encounter for surgical aftercare following surgery on the circulatory system: Secondary | ICD-10-CM

## 2015-05-23 ENCOUNTER — Telehealth (HOSPITAL_COMMUNITY): Payer: Self-pay

## 2015-05-23 NOTE — Telephone Encounter (Signed)
ref# [id number] pt need prior auth on ambien. Called to check on the status of the prior auth

## 2015-05-26 ENCOUNTER — Ambulatory Visit (INDEPENDENT_AMBULATORY_CARE_PROVIDER_SITE_OTHER): Payer: PPO | Admitting: Family

## 2015-05-26 ENCOUNTER — Ambulatory Visit (HOSPITAL_COMMUNITY)
Admission: RE | Admit: 2015-05-26 | Discharge: 2015-05-26 | Disposition: A | Discharge status: Home / Self Care | Payer: PPO | Source: Ambulatory Visit | Attending: Vascular Surgery | Admitting: Vascular Surgery

## 2015-05-26 ENCOUNTER — Encounter: Payer: Self-pay | Admitting: Family

## 2015-05-26 ENCOUNTER — Telehealth (HOSPITAL_COMMUNITY): Payer: Self-pay

## 2015-05-26 VITALS — BP 153/88 | HR 63 | Temp 97.4°F | Resp 16 | Ht 71.0 in | Wt 237.0 lb

## 2015-05-26 DIAGNOSIS — Z87891 Personal history of nicotine dependence: Secondary | ICD-10-CM

## 2015-05-26 DIAGNOSIS — E785 Hyperlipidemia, unspecified: Secondary | ICD-10-CM | POA: Insufficient documentation

## 2015-05-26 DIAGNOSIS — Z48812 Encounter for surgical aftercare following surgery on the circulatory system: Secondary | ICD-10-CM | POA: Insufficient documentation

## 2015-05-26 DIAGNOSIS — I6523 Occlusion and stenosis of bilateral carotid arteries: Secondary | ICD-10-CM | POA: Diagnosis not present

## 2015-05-26 DIAGNOSIS — I6521 Occlusion and stenosis of right carotid artery: Secondary | ICD-10-CM | POA: Diagnosis not present

## 2015-05-26 DIAGNOSIS — Z9889 Other specified postprocedural states: Secondary | ICD-10-CM

## 2015-05-26 NOTE — Progress Notes (Signed)
Filed Vitals:   05/26/15 1524 05/26/15 1527 05/26/15 1533 05/26/15 1534  BP: 159/87 144/88 159/89 153/88  Pulse: 63 62 63 63  Temp:  97.4 F (36.3 C)    TempSrc:  Oral    Resp:  16    Height:  5\' 11"  (1.803 m)    Weight:  237 lb (107.502 kg)    SpO2:  97%

## 2015-05-26 NOTE — Patient Instructions (Signed)
Stroke Prevention Some medical conditions and behaviors are associated with an increased chance of having a stroke. You may prevent a stroke by making healthy choices and managing medical conditions. HOW CAN I REDUCE MY RISK OF HAVING A STROKE?   Stay physically active. Get at least 30 minutes of activity on most or all days.  Do not smoke. It may also be helpful to avoid exposure to secondhand smoke.  Limit alcohol use. Moderate alcohol use is considered to be:  No more than 2 drinks per day for men.  No more than 1 drink per day for nonpregnant women.  Eat healthy foods. This involves:  Eating 5 or more servings of fruits and vegetables a day.  Making dietary changes that address high blood pressure (hypertension), high cholesterol, diabetes, or obesity.  Manage your cholesterol levels.  Making food choices that are high in fiber and low in saturated fat, trans fat, and cholesterol may control cholesterol levels.  Take any prescribed medicines to control cholesterol as directed by your health care provider.  Manage your diabetes.  Controlling your carbohydrate and sugar intake is recommended to manage diabetes.  Take any prescribed medicines to control diabetes as directed by your health care provider.  Control your hypertension.  Making food choices that are low in salt (sodium), saturated fat, trans fat, and cholesterol is recommended to manage hypertension.  Ask your health care provider if you need treatment to lower your blood pressure. Take any prescribed medicines to control hypertension as directed by your health care provider.  If you are 18-39 years of age, have your blood pressure checked every 3-5 years. If you are 40 years of age or older, have your blood pressure checked every year.  Maintain a healthy weight.  Reducing calorie intake and making food choices that are low in sodium, saturated fat, trans fat, and cholesterol are recommended to manage  weight.  Stop drug abuse.  Avoid taking birth control pills.  Talk to your health care provider about the risks of taking birth control pills if you are over 35 years old, smoke, get migraines, or have ever had a blood clot.  Get evaluated for sleep disorders (sleep apnea).  Talk to your health care provider about getting a sleep evaluation if you snore a lot or have excessive sleepiness.  Take medicines only as directed by your health care provider.  For some people, aspirin or blood thinners (anticoagulants) are helpful in reducing the risk of forming abnormal blood clots that can lead to stroke. If you have the irregular heart rhythm of atrial fibrillation, you should be on a blood thinner unless there is a good reason you cannot take them.  Understand all your medicine instructions.  Make sure that other conditions (such as anemia or atherosclerosis) are addressed. SEEK IMMEDIATE MEDICAL CARE IF:   You have sudden weakness or numbness of the face, arm, or leg, especially on one side of the body.  Your face or eyelid droops to one side.  You have sudden confusion.  You have trouble speaking (aphasia) or understanding.  You have sudden trouble seeing in one or both eyes.  You have sudden trouble walking.  You have dizziness.  You have a loss of balance or coordination.  You have a sudden, severe headache with no known cause.  You have new chest pain or an irregular heartbeat. Any of these symptoms may represent a serious problem that is an emergency. Do not wait to see if the symptoms will   go away. Get medical help at once. Call your local emergency services (911 in [place] Do not drive yourself to the hospital.   This information is not intended to replace advice given to you by your health care provider. Make sure you discuss any questions you have with your health care provider.   Document Released: 07/08/2004 Document Revised: 06/21/2014 Document Reviewed:  12/01/2012 Elsevier Interactive Patient Education 2016 Elsevier Inc.  

## 2015-05-26 NOTE — Progress Notes (Signed)
Chief Complaint: Extracranial Carotid Artery Stenosis   History of Present Illness  William [name] is a 74 y.o. male patient of Dr. Oneida Alar who is status post right CEA in 2006, left CEA in 2010, returns today for follow up.   Patient has positive history of a preoperative stroke in 2006, symptoms manifested by trouble getting out of bed during the night, left arm and leg weakness with slurred speech, vision was affected but wife and patient do not remember which eye, also had left facial drooping.  The patient's previous neurologic deficits are improved, he still has left arm and leg weakness, left facial drooping. He has had no further stroke or TIA symptoms since 2006.  Pt states the chronic tongue moving habit started about June 2015, he attributes to a medication that causes dry mouth.   Pt denies claudication symptoms with walking, denies non healing wounds.  Reports New Medical or Surgical History: fractured left 5th metacarpal when he fell against a door on vacation, is wearing a custom hand brace/partial immobilizer.   Pt Diabetic: No Pt smoker: former smoker, quit in 2006  Pt meds include: Statin : Yes ASA: Yes Other anticoagulants/antiplatelets: no   Past Medical History  Diagnosis Date  . Hyperlipidemia   . ANXIETY   . Urinary frequency   . BPH (benign prostatic hypertrophy)   . Eczema   . Bladder stone   . History of carotid artery stenosis     S/P BILATERAL CEA  . S/P carotid endarterectomy     BILATERAL ICA--  PATENT PER DUPLEX  05-19-2012  . CVD (cerebrovascular disease)   . Vitamin D deficiency   . 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 GERD (gastroesophageal reflux disease)   . Nocturia   . Stroke [facility]) 1996    left side weakness  . Carotid artery occlusion   . 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 Smoking status: Former Smoker -- 2.00  packs/day for 40 years    Types: Cigarettes    Quit date: 02/15/2005  . Smokeless tobacco: Never Used  . Alcohol Use: 0.0 oz/week    0 Standard drinks or equivalent per week     Comment: Occasional    Family History Family History  Problem Relation Age of Onset  . Heart disease Mother     CHF  . Bipolar disorder Mother   . Heart disease Father     CHF    Surgical History Past Surgical History  Procedure Laterality Date  . Carotid endarterectomy Bilateral LEFT  11-12-2008  DR GREG HAYES    RIGHT ICA  2006  ([facility])  . Inguinal hernia repair Right 11-08-2006  . Cardiovascular stress test  03-27-2012  DR CRENSHAW    LOW RISK LEXISCAN STUDY-- PROBABLE NORMAL PERFUSION AND SOFT TISSUE ATTENUATION/  NO ISCHEMIA/ EF 51%  . Appendectomy  AS CHILD  . Cystoscopy with litholapaxy N/A 02/26/2013    Procedure: CYSTOSCOPY WITH LITHOLAPAXY;  Surgeon: Franchot Gallo, MD;  Location: [facility];  Service: Urology;  Laterality: N/A;  . Transurethral resection of prostate N/A 02/26/2013    Procedure: TRANSURETHRAL RESECTION OF THE PROSTATE WITH GYRUS INSTRUMENTS;  Surgeon: Franchot Gallo, MD;  Location: [facility];  Service: Urology;  Laterality: N/A;  . Prostate surgery    . Mohs surgery Left 1/ 2016    Dr  Hall-- Basal cell  . Eye surgery  Jan. 2016    cataract surgery both eyes    Allergies  Allergen Reactions  . Adhesive [Tape] Other (See Comments)    BLISTER    Current Outpatient Prescriptions  Medication Sig Dispense Refill  . aspirin 325 MG EC tablet Take 325 mg by mouth daily.    . B Complex Vitamins (VITAMIN B COMPLEX PO) Take by mouth daily.      . Calcium Carb-Cholecalciferol (CALCIUM 1000 + D PO) Take by mouth daily.      . carbamazepine (TEGRETOL-XR) 100 MG 12 hr tablet 1 qam  1  qhs 60 tablet 8  . Cholecalciferol (VITAMIN D) 2000 UNITS CAPS Take by mouth.    . esomeprazole (NEXIUM) 40 MG capsule Take 1 capsule (40 mg total) by mouth as  needed. 90 capsule 3  . fenofibrate 160 MG tablet Take 1 tablet (160 mg total) by mouth daily. 90 tablet 1  . Flaxseed, Linseed, (FLAX SEED OIL PO) Take by mouth daily.      [name] LORazepam (ATIVAN) 0.5 MG tablet 2 bid 120 tablet 5  . Multiple Vitamin (MULTIVITAMIN) tablet Take 1 tablet by mouth daily.    . rosuvastatin (CRESTOR) 40 MG tablet Take 1 tablet (40 mg total) by mouth daily. 90 tablet 3  . traMADol (ULTRAM) 50 MG tablet Take 1 tablet (50 mg total) by mouth every 8 (eight) hours as needed. 20 tablet 0  . zolpidem (AMBIEN) 10 MG tablet Take 1 tablet (10 mg total) by mouth at bedtime as needed for sleep. 30 tablet 5   No current facility-administered medications for this visit.    Review of Systems : See HPI for pertinent positives and negatives.  Physical Examination  Filed Vitals:   05/26/15 1524 05/26/15 1527 05/26/15 1533 05/26/15 1534  BP: 159/87 144/88 159/89 153/88  Pulse: 63 62 63 63  Temp:  97.4 F (36.3 C)    TempSrc:  Oral    Resp:  16    Height:  5\' 11"  (1.803 m)    Weight:  237 lb (107.502 kg)    SpO2:  97%     Body mass index is 33.07 kg/(m^2).   General: WDWN obese male in NAD GAIT: normal Eyes: PERRLA Pulmonary: CTAB, non labored Cardiac: regular rhythm, no detected murmur.  VASCULAR EXAM Carotid Bruits Left Right   Negative Negative   Aorta is not palpable. Radial pulses: 2+ right, 1+ left.      LE Pulses LEFT RIGHT   POPLITEAL  palpable   palpable   POSTERIOR TIBIAL not palpable  not palpable    DORSALIS PEDIS  ANTERIOR TIBIAL palpable  palpable     Gastrointestinal: soft, nontender, BS WNL, no r/g, no palpable masses.  Musculoskeletal: No muscle atrophy/wasting. M/S 5/5 throughout, Extremities without ischemic changes.  Neurologic: A&O X 3;  Appropriate Affect, Speech is normal, CN 2-12 intact except left facial droop with smile only, pain and light touch intact in extremities, Motor exam as listed above.        Non-Invasive Vascular Imaging CAROTID DUPLEX 05/26/2015   Right ICA: CEA site, 1-39% stenosis. Left ICA: CEA site, no restenosis. Bilateral vertebral artery is antegrade. No significant change from 05/23/14.   Assessment: William [name] is a 74 y.o. male who is status post right CEA in 2006, left CEA in 2010. He has a hx of a preoperative stroke in 2006, no strokes or TIA's subsequently.  Carotid duplex today suggests minimal  right ICA restenosis and no left ICA restenosis. No significant change from 05/23/14.   Plan: Follow-up in 1 year with Carotid Duplex scan.   I discussed in depth with the patient the nature of atherosclerosis, and emphasized the importance of maximal medical management including strict control of blood pressure, blood glucose, and lipid levels, obtaining regular exercise, and continued cessation of smoking.  The patient is aware that without maximal medical management the underlying atherosclerotic disease process will progress, limiting the benefit of any interventions. The patient was given information about stroke prevention and what symptoms should prompt the patient to seek immediate medical care. Thank you for allowing [place] to participate in this patient's care.  Clemon Chambers, RN, MSN, FNP-C [facility] Office: [phone]  Clinic Physician: Trula Slade  05/26/2015 4:19 PM

## 2015-05-26 NOTE — Telephone Encounter (Signed)
Medication management - Left 2 messages at [phone] that our office had received needed forms for continuation of pt's prescribed Ambien but Dr. Casimiro Needle had been out on vacation, would return to our office on 05/28/15 and would send forms in then.  Informed the medication was a continuation of the Zolpidem for patient and reported once Dr. Casimiro Needle is back to complete the forms with his signature, on 05/28/15, the forms would be faxed back to their office.  Requested they call us back if any questions and also called and spoke with Deshauna at [place] to inform of the same as they agreed to send a note to [facility] as well about Dr. Casimiro Needle currently being out as the reason forms have not been returned as of this date.  Reference # [id number].  Form is prepared for Dr. Karen Chafe review to sign and fax.

## 2015-05-27 NOTE — Addendum Note (Signed)
Addended by: Dorthula Rue L on: 05/27/2015 10:23 AM   Modules accepted: Orders

## 2015-05-28 ENCOUNTER — Ambulatory Visit (INDEPENDENT_AMBULATORY_CARE_PROVIDER_SITE_OTHER): Payer: PRIVATE HEALTH INSURANCE | Admitting: Psychiatry

## 2015-05-28 VITALS — BP 152/82 | HR 67 | Ht 71.0 in | Wt 237.0 lb

## 2015-05-28 DIAGNOSIS — F0630 Mood disorder due to known physiological condition, unspecified: Secondary | ICD-10-CM

## 2015-05-28 MED ORDER — CARBAMAZEPINE ER 100 MG PO TB12: ORAL_TABLET | ORAL | Status: DC

## 2015-05-28 MED ORDER — ESZOPICLONE 3 MG PO TABS: 3.0000 mg | ORAL_TABLET | Freq: Every evening | ORAL | Status: DC | PRN

## 2015-05-28 MED ORDER — LORAZEPAM 0.5 MG PO TABS: ORAL_TABLET | ORAL | Status: DC

## 2015-05-28 NOTE — Progress Notes (Signed)
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 He had a Mini-Mental Status exam performed. Expected Mini-Mental Status exam should've been 28 but he scored 23. The patient had a stroke and since that time many years ago, inflicted. 2 years ago he lost his confidence to drive. His wife generally cares for him. He doesn't do any significant institutional ADLs. He does all his basic ADLs without a problem. He isn't a good mood. His wife and him just got back from a cruise where unfortunately he broke his left hand at the end. The patient denies daily depression or anxiety. He is sleeping and eating very well. He's got good energy and has no problems concentrating. He loves watching television walking is 5 dogs. He likes TV. He is a good sense of worth. Is not suicidal now and never has been. He denies the use of alcohol or substance. His wife's name is [name] was evaluated with him. The patient showed a minor interested in driving quickly was able to give up. He good holidays and looks forward to Christmas. He denies any chest pain or shortness of breath. He denies any neurological symptoms. It's noted that his systolic blood pressure was just a little bit high and he'll be seeing his primary care doctor in the next month so he will bring it up with them. Overall physically he feels well. His wife agrees there is no significant change. Importantly the patient shows no evidence of a progressive dementing process at this time. The patient was seen by a local neurologist within no longer go back to. I will attempt to get his neuropsych testing that was done on this patient. Generally the patient is well. Patient Active Problem List   Diagnosis Date Noted  . Closed fracture of fifth metacarpal bone [S62.308A] 05/15/2015  . 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 Great toe pain [M79.676] 02/11/2014  . 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 Obesity (BMI 30-39.9) [E66.9] 06/25/2013  . Renal insufficiency [N28.9] 06/25/2013  . Weakness of left arm [R29.898] 06/25/2013  . Aftercare following surgery of the circulatory system, [place] [Z48.812] 05/17/2013  . Confusion [R41.0] 04/17/2012  . Sebaceous cyst [L72.3] 03/03/2011  . LUMBAR SPRAIN AND STRAIN [S33.5XXA] 07/31/2010  . URINARY FREQUENCY [R35.0] 09/25/2009  . RIB PAIN, LEFT SIDED [R07.9] 08/29/2009  . DEPRESSION/ANXIETY [F34.1] 08/01/2009  . 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 DIZZINESS [R42] 05/02/2009  . HEADACHE [R51] 05/02/2009  . ECZEMA, ATOPIC [L20.89] 05/31/2008  . UNSPECIFIED VITAMIN D DEFICIENCY [E55.9] 03/01/2008  . FATIGUE [R53.81, R53.83] 01/26/2008  . BENIGN PROSTATIC HYPERTROPHY, HX OF [Z87.898] 08/06/2007  . ANXIETY [F41.1] 12/21/2006  . HOARSENESS [R49.8] 12/21/2006  . FASTING HYPERGLYCEMIA [R73.09] 12/21/2006  . Hyperlipidemia LDL goal <100 [E78.5] 09/27/2006  . Unspecified cerebral artery occlusion with cerebral infarction [I63.50] 09/27/2006   Total Time spent with patient: 30 minutes  Past Psychiatric History:   Past Medical History:  Past Medical History  Diagnosis Date  . Hyperlipidemia   . ANXIETY   . Urinary frequency   . BPH (benign prostatic hypertrophy)   . Eczema   . Bladder stone   . History of carotid artery stenosis     S/P BILATERAL CEA  . S/P carotid  endarterectomy     BILATERAL ICA--  PATENT PER DUPLEX  05-19-2012  . CVD (cerebrovascular disease)   . Vitamin D deficiency   . 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 GERD (gastroesophageal reflux disease)   . Nocturia   . Stroke [facility]) 1996    left side weakness  . Carotid artery occlusion   . Chronic kidney disease 2014     Stage III    Past Surgical History  Procedure Laterality Date  . Carotid endarterectomy Bilateral LEFT  11-12-2008  DR GREG HAYES    RIGHT ICA  2006  ([facility])  . Inguinal hernia repair Right 11-08-2006  . Cardiovascular stress test  03-27-2012  DR CRENSHAW    LOW RISK LEXISCAN STUDY-- PROBABLE NORMAL PERFUSION AND SOFT TISSUE ATTENUATION/  NO ISCHEMIA/ EF 51%  . Appendectomy  AS CHILD  . Cystoscopy with litholapaxy N/A 02/26/2013    Procedure: CYSTOSCOPY WITH LITHOLAPAXY;  Surgeon: Franchot Gallo, MD;  Location: [facility];  Service: Urology;  Laterality: N/A;  . Transurethral resection of prostate N/A 02/26/2013    Procedure: TRANSURETHRAL RESECTION OF THE PROSTATE WITH GYRUS INSTRUMENTS;  Surgeon: Franchot Gallo, MD;  Location: [facility];  Service: Urology;  Laterality: N/A;  . Prostate surgery    . 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 Eye surgery  Jan. 2016    cataract surgery both eyes   Family History:  Family History  Problem Relation Age of Onset  . Heart disease Mother     CHF  . Bipolar disorder Mother   . Heart disease Father     CHF   Family Psychiatric  History:  Social History:  History  Alcohol Use  . 0.0 oz/week  . 0 Standard drinks or equivalent per week    Comment: Occasional     History  Drug Use No    Social History   Social History  . Marital Status: Married    Spouse Name: N/A  . Number of Children: 2  . Years of Education: N/A   Occupational History  .     Social History Main Topics  . Smoking status: Former Smoker -- 2.00 packs/day for 40 years    Types: Cigarettes    Quit date: 02/15/2005  . Smokeless tobacco: Never Used  . Alcohol Use: 0.0 oz/week    0 Standard drinks or equivalent per week     Comment: Occasional  . Drug Use: No  . Sexual Activity:    Partners: Female   Other Topics Concern  . Not on file   Social History Narrative   Exercise--  Engineer, manufacturing everyday    Additional Social History:                         Sleep: Good  Appetite:  Good  Current Medications: Current Outpatient Prescriptions  Medication Sig Dispense Refill  . aspirin 325 MG EC tablet Take 325 mg by mouth daily.    . B Complex Vitamins (VITAMIN B COMPLEX PO) Take by mouth daily.      . Calcium Carb-Cholecalciferol (CALCIUM 1000 + D PO) Take by mouth daily.      . carbamazepine (TEGRETOL-XR) 100 MG 12 hr tablet 1 qam  1  qhs 60 tablet 8  . Cholecalciferol (VITAMIN D) 2000 UNITS CAPS Take by mouth.    . esomeprazole ([place]) 40  MG capsule Take 1 capsule (40 mg total) by mouth as needed. 90 capsule 3  . Eszopiclone (ESZOPICLONE) 3 MG TABS Take 1 tablet (3 mg total) by mouth at bedtime as needed. Take immediately before bedtime 30 tablet 5  . fenofibrate 160 MG tablet Take 1 tablet (160 mg total) by mouth daily. 90 tablet 1  . Flaxseed, Linseed, (FLAX SEED OIL PO) Take by mouth daily.      [name] LORazepam (ATIVAN) 0.5 MG tablet 2 bid 120 tablet 5  . Multiple Vitamin (MULTIVITAMIN) tablet Take 1 tablet by mouth daily.    . rosuvastatin (CRESTOR) 40 MG tablet Take 1 tablet (40 mg total) by mouth daily. 90 tablet 3  . traMADol (ULTRAM) 50 MG tablet Take 1 tablet (50 mg total) by mouth every 8 (eight) hours as needed. 20 tablet 0  . zolpidem (AMBIEN) 10 MG tablet Take 1 tablet (10 mg total) by mouth at bedtime as needed for sleep. 30 tablet 5   No current facility-administered medications for this visit.    Lab Results: No results found for this or any previous visit (from the past 48 hour(s)).  Physical Findings: AIMS:  , ,  ,  ,    CIWA:    COWS:     Musculoskeletal: Strength & Muscle Tone: within normal limits Gait & Station: normal Patient leans: Right  Psychiatric Specialty Exam: ROS  Blood pressure 152/82, pulse 67, height 5\' 11"  (1.803 m), weight 237 lb (107.502 kg).Body mass index is 33.07 kg/(m^2).  General Appearance: Casual  Eye Contact::  Good   Speech:  Clear and Coherent  Volume:  Normal  Mood:  Dysphoric and Euthymic  Affect:  Appropriate  Thought Process:  Coherent  Orientation:  Full (Time, Place, and Person)  Thought Content:  WDL  Suicidal Thoughts:  No  Homicidal Thoughts:  No  Memory:  NA  Judgement:  Good  Insight:  Fair  Psychomotor Activity:  Normal  Concentration:  Good  Recall:  Good  Fund of Knowledge:Good  Language: Good  Akathisia:  No  Handed:  Right  AIMS (if indicated):     Assets:  Desire for Improvement  ADL's:  Intact  Cognition: WNL  Sleep:      Treatment Plan Summary: At this time the patient will continue taking all his medications including Tegretol and Ativan. The patient clearly has some cognitive issues. This is evident by the fact that his Mini-Mental status exam was 23. He difficulty drawing dimensional boxes or concentrating. The patient's mood is stable. He's not explosive or violent in any way. Is some degree of irritability but that may be situational. His wife is retired and they're always together and there is some tension between them. This is to be expected. Patient is not oversedated he has not fallen. Medically he is very stable. He does not drink and does not smoke. Patient is interested in getting his old neuropsych testing that was done in his neurology office which will attempt to get. For the time being will continue all his medications. This patient to return to see me in 5 months.  Dominic Rhome, [place] 05/28/2015, 2:22 PM

## 2015-05-28 NOTE — Telephone Encounter (Signed)
Medication management - Faxed back medication continuation forms for Ambien after Dr. Casimiro Needle reviewed and signed.

## 2015-05-30 ENCOUNTER — Telehealth (HOSPITAL_COMMUNITY): Payer: Self-pay

## 2015-05-30 NOTE — Telephone Encounter (Signed)
05/30/15 12:58PM  Dr. Georgie Chard office [facility]) [phone] called stating that they do not fax medical records it's on EPIC.Marland KitchenMariana Kaufman

## 2015-06-10 ENCOUNTER — Telehealth (HOSPITAL_COMMUNITY): Payer: Self-pay | Admitting: *Deleted

## 2015-06-10 NOTE — Telephone Encounter (Signed)
Prior authorization for [facility] received. Submitted online with cover my meds. Will need to re-check later as more questions may populate before decision is made.

## 2015-06-11 ENCOUNTER — Telehealth (HOSPITAL_COMMUNITY): Payer: Self-pay

## 2015-06-11 NOTE — Telephone Encounter (Signed)
Dione Housekeeper, pharmacist back at [place] to inform patient's William [name] was approved and they will call back if any problems filling patient's medication.

## 2015-06-11 NOTE — Telephone Encounter (Signed)
Medication management - Telephone call with [facility], pharmacist after they left a message checking on the status of patient's prior authorization for Lunesta.  Informed this nurse updated questions on CoverMyMeds and still pending a decision.   Agreed to inform pharmacy once a decision received about patient's continued coverage.

## 2015-06-24 ENCOUNTER — Ambulatory Visit: Payer: Self-pay | Admitting: Family Medicine

## 2015-06-25 MED FILL — FENOFIBRATE 160 MG TABLET: 160 | 90 days supply | Qty: 90 | Fill #0

## 2015-06-26 DIAGNOSIS — S62337A Displaced fracture of neck of fifth metacarpal bone, left hand, initial encounter for closed fracture: Secondary | ICD-10-CM | POA: Diagnosis not present

## 2015-07-01 ENCOUNTER — Other Ambulatory Visit: Payer: Self-pay | Admitting: Family Medicine

## 2015-07-03 MED FILL — TEGRETOL XR 100 MG TABLET: 100 | 30 days supply | Qty: 60 | Fill #1

## 2015-07-07 ENCOUNTER — Telehealth: Payer: Self-pay | Admitting: *Deleted

## 2015-07-07 NOTE — Telephone Encounter (Signed)
Called pt and spoke w/ patient's wife who stated "we will give the information when we get there tomorrow. I don't feel comfortable doing it over the phone."

## 2015-07-08 ENCOUNTER — Encounter: Payer: PPO | Admitting: Family Medicine

## 2015-07-09 ENCOUNTER — Ambulatory Visit (INDEPENDENT_AMBULATORY_CARE_PROVIDER_SITE_OTHER): Payer: PPO | Admitting: Medical

## 2015-07-09 ENCOUNTER — Encounter: Payer: Self-pay | Admitting: Medical

## 2015-07-09 VITALS — BP 124/82 | HR 89 | Temp 98.1°F | Ht 71.0 in | Wt 236.6 lb

## 2015-07-09 DIAGNOSIS — R05 Cough: Secondary | ICD-10-CM

## 2015-07-09 DIAGNOSIS — J069 Acute upper respiratory infection, unspecified: Secondary | ICD-10-CM

## 2015-07-09 DIAGNOSIS — R059 Cough, unspecified: Secondary | ICD-10-CM

## 2015-07-09 DIAGNOSIS — J3489 Other specified disorders of nose and nasal sinuses: Secondary | ICD-10-CM | POA: Diagnosis not present

## 2015-07-09 DIAGNOSIS — R0981 Nasal congestion: Secondary | ICD-10-CM | POA: Diagnosis not present

## 2015-07-09 MED ORDER — BENZONATATE 100 MG PO CAPS: 100.0000 mg | ORAL_CAPSULE | Freq: Three times a day (TID) | ORAL | Status: DC | PRN

## 2015-07-09 MED ORDER — AZITHROMYCIN 250 MG PO TABS: ORAL_TABLET | ORAL | Status: DC

## 2015-07-09 MED ORDER — FLUTICASONE PROPIONATE 50 MCG/ACT NA SUSP: 2.0000 | Freq: Every day | NASAL | Status: DC

## 2015-07-09 MED ORDER — ALBUTEROL SULFATE HFA 108 (90 BASE) MCG/ACT IN AERS: 2.0000 | INHALATION_SPRAY | Freq: Four times a day (QID) | RESPIRATORY_TRACT | Status: DC | PRN

## 2015-07-09 MED FILL — VENTOLIN HFA 90 MCG INHALER: 108 (90 BAS | 30 days supply | Qty: 18 | Fill #0

## 2015-07-09 MED FILL — BENZONATATE 100 MG CAPSULE: 100 | 7 days supply | Qty: 21 | Fill #0

## 2015-07-09 MED FILL — FLUTICASONE PROP 50 MCG SPR: 50 | 30 days supply | Qty: 16 | Fill #0

## 2015-07-09 MED FILL — AZITHROMYCIN 250 MG TABLET: 250 | 5 days supply | Qty: 6 | Fill #0

## 2015-07-09 NOTE — Progress Notes (Signed)
Pre visit review using our clinic review tool, if applicable. No additional management support is needed unless otherwise documented below in the visit note. 

## 2015-07-09 NOTE — Progress Notes (Signed)
Subjective:    Patient ID: William [name], male    DOB: 16-Jul-1940, 75 y.o.   MRN: [id number]  HPI  Pt in with one week of runny nose. Sinus pressure. Some frontal  ha. Also just recently feels like can't deep breath/winded when walking. Coughing some and pnd. Some productive cough.   No fevers and no chills. Some sweating but had heavy blanket that covered him at that time.   Review of Systems  Constitutional: Negative for fever, chills and fatigue.  HENT: Positive for congestion and sinus pressure. Negative for ear discharge and ear pain.   Respiratory: Positive for cough. Negative for chest tightness, shortness of breath and wheezing.   Cardiovascular: Negative for chest pain and palpitations.  Gastrointestinal: Negative for abdominal pain.  Musculoskeletal: Negative for back pain.  Neurological: Negative for dizziness, syncope, speech difficulty, weakness, numbness and headaches.  Hematological: Negative for adenopathy. Does not bruise/bleed easily.  Psychiatric/Behavioral: Negative for behavioral problems and confusion.     Past Medical History  Diagnosis Date  . Hyperlipidemia   . ANXIETY   . Urinary frequency   . BPH (benign prostatic hypertrophy)   . Eczema   . Bladder stone   . History of carotid artery stenosis     S/P BILATERAL CEA  . S/P carotid endarterectomy     BILATERAL ICA--  PATENT PER DUPLEX  05-19-2012  . CVD (cerebrovascular disease)   . Vitamin D deficiency   . 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 GERD (gastroesophageal reflux disease)   . Nocturia   . Stroke [facility]) 1996    left side weakness  . Carotid artery occlusion   . 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 Marital Status: Married    Spouse Name: N/A  . Number of Children: 2  . Years of Education: N/A   Occupational History  .     Social History Main Topics  . Smoking status: Former Smoker -- 2.00  packs/day for 40 years    Types: Cigarettes    Quit date: 02/15/2005  . Smokeless tobacco: Never Used  . Alcohol Use: 0.0 oz/week    0 Standard drinks or equivalent per week     Comment: Occasional  . Drug Use: No  . Sexual Activity:    Partners: Female   Other Topics Concern  . Not on file   Social History Narrative   Exercise--  Walks dogs everyday    Past Surgical History  Procedure Laterality Date  . Carotid endarterectomy Bilateral LEFT  11-12-2008  DR GREG HAYES    RIGHT ICA  2006  ([facility])  . Inguinal hernia repair Right 11-08-2006  . Cardiovascular stress test  03-27-2012  DR CRENSHAW    LOW RISK LEXISCAN STUDY-- PROBABLE NORMAL PERFUSION AND SOFT TISSUE ATTENUATION/  NO ISCHEMIA/ EF 51%  . Appendectomy  AS CHILD  . Cystoscopy with litholapaxy N/A 02/26/2013    Procedure: CYSTOSCOPY WITH LITHOLAPAXY;  Surgeon: Franchot Gallo, MD;  Location: [facility];  Service: Urology;  Laterality: N/A;  . Transurethral resection of prostate N/A 02/26/2013    Procedure: TRANSURETHRAL RESECTION OF THE PROSTATE WITH GYRUS INSTRUMENTS;  Surgeon: Franchot Gallo, MD;  Location: [facility];  Service: Urology;  Laterality: N/A;  . Prostate surgery    . 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  Eye surgery  Jan. 2016    cataract surgery both eyes    Family History  Problem Relation Age of Onset  . Heart disease Mother     CHF  . Bipolar disorder Mother   . Heart disease Father     CHF    Allergies  Allergen Reactions  . Adhesive [Tape] Other (See Comments)    BLISTER    Current Outpatient Prescriptions on File Prior to Visit  Medication Sig Dispense Refill  . aspirin 325 MG EC tablet Take 325 mg by mouth daily.    . B Complex Vitamins (VITAMIN B COMPLEX PO) Take by mouth daily.      . Calcium Carb-Cholecalciferol (CALCIUM 1000 + D PO) Take by mouth daily.      . carbamazepine (TEGRETOL-XR) 100 MG 12 hr tablet 1 qam  1  qhs 60  tablet 8  . Cholecalciferol (VITAMIN D) 2000 UNITS CAPS Take by mouth.    . Eszopiclone (ESZOPICLONE) 3 MG TABS Take 1 tablet (3 mg total) by mouth at bedtime as needed. Take immediately before bedtime 30 tablet 5  . fenofibrate 160 MG tablet Take 1 tablet (160 mg total) by mouth daily. 90 tablet 1  . Flaxseed, Linseed, (FLAX SEED OIL PO) Take by mouth daily.      [name] LORazepam (ATIVAN) 0.5 MG tablet 2 bid 120 tablet 5  . Multiple Vitamin (MULTIVITAMIN) tablet Take 1 tablet by mouth daily.    [name] NEXIUM 40 MG capsule TAKE 1 CAPSULE BY MOUTH AS NEEDED 90 capsule 0  . rosuvastatin (CRESTOR) 40 MG tablet Take 1 tablet (40 mg total) by mouth daily. 90 tablet 3  . traMADol (ULTRAM) 50 MG tablet Take 1 tablet (50 mg total) by mouth every 8 (eight) hours as needed. 20 tablet 0  . zolpidem (AMBIEN) 10 MG tablet Take 1 tablet (10 mg total) by mouth at bedtime as needed for sleep. 30 tablet 5   No current facility-administered medications on file prior to visit.    BP 124/82 mmHg  Pulse 89  Temp(Src) 98.1 F (36.7 C) (Oral)  Ht 5\' 11"  (1.803 m)  Wt 236 lb 9.6 oz (107.321 kg)  BMI 33.01 kg/m2  SpO2 98%       Objective:   Physical Exam  General  Mental Status - Alert. General Appearance - Well groomed. Not in acute distress.  Skin Rashes- No Rashes.  HEENT Head- Normal. Ear Auditory Canal - Left- Normal. Right - Normal.Tympanic Membrane- Left- Normal. Right- Normal. Eye Sclera/Conjunctiva- Left- Normal. Right- Normal. Nose & Sinuses Nasal Mucosa- Left-  Boggy and Congested. Right-  Boggy and  Congested.Bilateral no  maxillary and  No frontal sinus pressure. Mouth & Throat Lips: Upper Lip- Normal: no dryness, cracking, pallor, cyanosis, or vesicular eruption. Lower Lip-Normal: no dryness, cracking, pallor, cyanosis or vesicular eruption. Buccal Mucosa- Bilateral- No Aphthous ulcers. Oropharynx- No Discharge or Erythema. Tonsils: Characteristics- Bilateral- No Erythema or Congestion.  Size/Enlargement- Bilateral- No enlargement. Discharge- bilateral-None.  Neck Neck- Supple. No Masses.   Chest and Lung Exam Auscultation: Breath Sounds:-Clear even and unlabored.  Cardiovascular Auscultation:Rythm- Regular, rate and rhythm. Murmurs & Other Heart Sounds:Ausculatation of the heart reveal- No Murmurs.  Lymphatic Head & Neck General Head & Neck Lymphatics: Bilateral: Description- No Localized lymphadenopathy.       Assessment & Plan:  Upper respiratory infection for one week  vs early bronchitis.  For cough will prescribe benzonatate.  For nasal congestion flonase nasal spray.  For possible reactive  airway disease will make albuterol inhaler available for wheezing or shortness of breath.  Some concern that infection symptoms may worsen(bronchitis or sinusitis like). So I am making azithromycin available.  Follow up in 7-10 days or as needed

## 2015-07-09 NOTE — Patient Instructions (Addendum)
Upper respiratory infection for one week  vs early bronchitis.  For cough will prescribe benzonatate.  For nasal congestion flonase nasal spray.  For possible reactive airway disease will make albuterol inhaler available for wheezing or shortness of breath.  Some concern that infection symptoms may worsen(bronchitis or sinusitis like). So I am making azithromycin available.  Follow up in 7-10 days or as needed

## 2015-07-15 MED FILL — LORazepam 0.5 MG TABS: 0.5 | 30 days supply | Qty: 120 | Fill #1

## 2015-07-17 ENCOUNTER — Encounter: Payer: Self-pay | Admitting: Family Medicine

## 2015-07-17 ENCOUNTER — Ambulatory Visit (INDEPENDENT_AMBULATORY_CARE_PROVIDER_SITE_OTHER): Payer: PPO | Admitting: Family Medicine

## 2015-07-17 VITALS — BP 162/79 | HR 63 | Temp 98.0°F | Ht 71.0 in | Wt 236.2 lb

## 2015-07-17 DIAGNOSIS — I10 Essential (primary) hypertension: Secondary | ICD-10-CM | POA: Diagnosis not present

## 2015-07-17 DIAGNOSIS — E785 Hyperlipidemia, unspecified: Secondary | ICD-10-CM

## 2015-07-17 DIAGNOSIS — N400 Enlarged prostate without lower urinary tract symptoms: Secondary | ICD-10-CM

## 2015-07-17 DIAGNOSIS — Z1159 Encounter for screening for other viral diseases: Secondary | ICD-10-CM

## 2015-07-17 DIAGNOSIS — Z0000 Encounter for general adult medical examination without abnormal findings: Secondary | ICD-10-CM

## 2015-07-17 LAB — POCT URINALYSIS DIPSTICK
BILIRUBIN UA: NEGATIVE
Blood, UA: NEGATIVE
Glucose, UA: NEGATIVE
KETONES UA: NEGATIVE
LEUKOCYTES UA: NEGATIVE
Nitrite, UA: NEGATIVE
Spec Grav, UA: >=1.03
Urobilinogen, UA: 0.2
pH, UA: 6

## 2015-07-17 LAB — COMPREHENSIVE METABOLIC PANEL
ALT: 29 U/L (ref 0–53)
AST: 28 U/L (ref 0–37)
Albumin: 4.1 g/dL (ref 3.5–5.2)
Alkaline Phosphatase: 100 U/L (ref 39–117)
BUN: 18 mg/dL (ref 6–23)
CHLORIDE: 106 meq/L (ref 96–112)
CO2: 26 meq/L (ref 19–32)
Calcium: 9.9 mg/dL (ref 8.4–10.5)
Creatinine, Ser: 1.31 mg/dL (ref 0.40–1.50)
GFR: 57.01 mL/min — ABNORMAL LOW (ref >60.00)
GLUCOSE: 93 mg/dL (ref 70–99)
POTASSIUM: 4.1 meq/L (ref 3.5–5.1)
SODIUM: 141 meq/L (ref 135–145)
Total Bilirubin: 0.4 mg/dL (ref 0.2–1.2)
Total Protein: 7.5 g/dL (ref 6.0–8.3)

## 2015-07-17 LAB — LIPID PANEL
CHOLESTEROL: 140 mg/dL (ref 0–200)
HDL: 54.1 mg/dL (ref >39.00)
LDL CALC: 67 mg/dL (ref 0–99)
NonHDL: 85.54
Total CHOL/HDL Ratio: 3
Triglycerides: 94 mg/dL (ref 0.0–149.0)
VLDL: 18.8 mg/dL (ref 0.0–40.0)

## 2015-07-17 LAB — CBC WITH DIFFERENTIAL/PLATELET
BASOS PCT: 0.8 % (ref 0.0–3.0)
Basophils Absolute: 0.1 10*3/uL (ref 0.0–0.1)
EOS PCT: 5.2 % — AB (ref 0.0–5.0)
Eosinophils Absolute: 0.4 10*3/uL (ref 0.0–0.7)
HCT: 41.7 % (ref 39.0–52.0)
Hemoglobin: 13.6 g/dL (ref 13.0–17.0)
LYMPHS ABS: 2.4 10*3/uL (ref 0.7–4.0)
Lymphocytes Relative: 27.9 % (ref 12.0–46.0)
MCHC: 32.7 g/dL (ref 30.0–36.0)
MCV: 92.4 fl (ref 78.0–100.0)
MONOS PCT: 6.4 % (ref 3.0–12.0)
Monocytes Absolute: 0.5 10*3/uL (ref 0.1–1.0)
NEUTROS ABS: 5.1 10*3/uL (ref 1.4–7.7)
NEUTROS PCT: 59.7 % (ref 43.0–77.0)
PLATELETS: 294 10*3/uL (ref 150.0–400.0)
RBC: 4.51 Mil/uL (ref 4.22–5.81)
RDW: 13.6 % (ref 11.5–15.5)
WBC: 8.5 10*3/uL (ref 4.0–10.5)

## 2015-07-17 LAB — PSA: PSA: 2.14 ng/mL (ref 0.10–4.00)

## 2015-07-17 LAB — HEPATITIS C ANTIBODY: HCV AB: NEGATIVE

## 2015-07-17 MED ORDER — LISINOPRIL 10 MG PO TABS
10.0000 mg | ORAL_TABLET | Freq: Every day | ORAL | Status: DC
Start: 2015-07-17 — End: 2015-10-10

## 2015-07-17 MED FILL — LISINOPRIL 10 MG TABLET: 10 | 30 days supply | Qty: 30 | Fill #0

## 2015-07-17 NOTE — Patient Instructions (Signed)
Preventive Care for Adults, Male A healthy lifestyle and preventive care can promote health and wellness. Preventive health guidelines for men include the following key practices:  A routine yearly physical is a good way to check with your health care provider about your health and preventative screening. It is a chance to share any concerns and updates on your health and to receive a thorough exam.  Visit your dentist for a routine exam and preventative care every 6 months. Brush your teeth twice a day and floss once a day. Good oral hygiene prevents tooth decay and gum disease.  The frequency of eye exams is based on your age, health, family medical history, use of contact lenses, and other factors. Follow your health care provider's recommendations for frequency of eye exams.  Eat a healthy diet. Foods such as vegetables, fruits, whole grains, low-fat dairy products, and lean protein foods contain the nutrients you need without too many calories. Decrease your intake of foods high in solid fats, added sugars, and salt. Eat the right amount of calories for you.Get information about a proper diet from your health care provider, if necessary.  Regular physical exercise is one of the most important things you can do for your health. Most adults should get at least 150 minutes of moderate-intensity exercise (any activity that increases your heart rate and causes you to sweat) each week. In addition, most adults need muscle-strengthening exercises on 2 or more days a week.  Maintain a healthy weight. The body mass index (BMI) is a screening tool to identify possible weight problems. It provides an estimate of body fat based on height and weight. Your health care provider can find your BMI and can help you achieve or maintain a healthy weight.For adults 20 years and older:  A BMI below 18.5 is considered underweight.  A BMI of 18.5 to 24.9 is normal.  A BMI of 25 to 29.9 is considered  overweight.  A BMI of 30 and above is considered obese.  Maintain normal blood lipids and cholesterol levels by exercising and minimizing your intake of saturated fat. Eat a balanced diet with plenty of fruit and vegetables. Blood tests for lipids and cholesterol should begin at age 20 and be repeated every 5 years. If your lipid or cholesterol levels are high, you are over 50, or you are at high risk for heart disease, you may need your cholesterol levels checked more frequently.Ongoing high lipid and cholesterol levels should be treated with medicines if diet and exercise are not working.  If you smoke, find out from your health care provider how to quit. If you do not use tobacco, do not start.  Lung cancer screening is recommended for adults aged 55-80 years who are at high risk for developing lung cancer because of a history of smoking. A yearly low-dose CT scan of the lungs is recommended for people who have at least a 30-pack-year history of smoking and are a current smoker or have quit within the past 15 years. A pack year of smoking is smoking an average of 1 pack of cigarettes a day for 1 year (for example: 1 pack a day for 30 years or 2 packs a day for 15 years). Yearly screening should continue until the smoker has stopped smoking for at least 15 years. Yearly screening should be stopped for people who develop a health problem that would prevent them from having lung cancer treatment.  If you choose to drink alcohol, do not have more   than 2 drinks per day. One drink is considered to be 12 ounces (355 mL) of beer, 5 ounces (148 mL) of wine, or 1.5 ounces (44 mL) of liquor.  Avoid use of street drugs. Do not share needles with anyone. Ask for help if you need support or instructions about stopping the use of drugs.  High blood pressure causes heart disease and increases the risk of stroke. Your blood pressure should be checked at least every 1-2 years. Ongoing high blood pressure should be  treated with medicines, if weight loss and exercise are not effective.  If you are 34-90 years old, ask your health care provider if you should take aspirin to prevent heart disease.  Diabetes screening is done by taking a blood sample to check your blood glucose level after you have not eaten for a certain period of time (fasting). If you are not overweight and you do not have risk factors for diabetes, you should be screened once every 3 years starting at age 35. If you are overweight or obese and you are 70-84 years of age, you should be screened for diabetes every year as part of your cardiovascular risk assessment.  Colorectal cancer can be detected and often prevented. Most routine colorectal cancer screening begins at the age of 18 and continues through age 69. However, your health care provider may recommend screening at an earlier age if you have risk factors for colon cancer. On a yearly basis, your health care provider may provide home test kits to check for hidden blood in the stool. Use of a small camera at the end of a tube to directly examine the colon (sigmoidoscopy or colonoscopy) can detect the earliest forms of colorectal cancer. Talk to your health care provider about this at age 71, when routine screening begins. Direct exam of the colon should be repeated every 5-10 years through age 18, unless early forms of precancerous polyps or small growths are found.  People who are at an increased risk for hepatitis B should be screened for this virus. You are considered at high risk for hepatitis B if:  You were born in a country where hepatitis B occurs often. Talk with your health care provider about which countries are considered high risk.  Your parents were born in a high-risk country and you have not received a shot to protect against hepatitis B (hepatitis B vaccine).  You have HIV or AIDS.  You use needles to inject street drugs.  You live with, or have sex with, someone who  has hepatitis B.  You are a man who has sex with other men (MSM).  You get hemodialysis treatment.  You take certain medicines for conditions such as cancer, organ transplantation, and autoimmune conditions.  Hepatitis C blood testing is recommended for all people born from [birth year] through 1965 and any individual with known risks for hepatitis C.  Practice safe sex. Use condoms and avoid high-risk sexual practices to reduce the spread of sexually transmitted infections (STIs). STIs include gonorrhea, chlamydia, syphilis, trichomonas, herpes, HPV, and human immunodeficiency virus (HIV). Herpes, HIV, and HPV are viral illnesses that have no cure. They can result in disability, cancer, and death.  If you are a man who has sex with other men, you should be screened at least once per year for:  HIV.  Urethral, rectal, and pharyngeal infection of gonorrhea, chlamydia, or both.  If you are at risk of being infected with HIV, it is recommended that you take a  prescription medicine daily to prevent HIV infection. This is called preexposure prophylaxis (PrEP). You are considered at risk if:  You are a man who has sex with other men (MSM) and have other risk factors.  You are a heterosexual man, are sexually active, and are at increased risk for HIV infection.  You take drugs by injection.  You are sexually active with a partner who has HIV.  Talk with your health care provider about whether you are at high risk of being infected with HIV. If you choose to begin PrEP, you should first be tested for HIV. You should then be tested every 3 months for as long as you are taking PrEP.  A one-time screening for abdominal aortic aneurysm (AAA) and surgical repair of large AAAs by ultrasound are recommended for men ages 44 to 66 years who are current or former smokers.  Healthy men should no longer receive prostate-specific antigen (PSA) blood tests as part of routine cancer screening. Talk with your health  care provider about prostate cancer screening.  Testicular cancer screening is not recommended for adult males who have no symptoms. Screening includes self-exam, a health care provider exam, and other screening tests. Consult with your health care provider about any symptoms you have or any concerns you have about testicular cancer.  Use sunscreen. Apply sunscreen liberally and repeatedly throughout the day. You should seek shade when your shadow is shorter than you. Protect yourself by wearing long sleeves, pants, a wide-brimmed hat, and sunglasses year round, whenever you are outdoors.  Once a month, do a whole-body skin exam, using a mirror to look at the skin on your back. Tell your health care provider about new moles, moles that have irregular borders, moles that are larger than a pencil eraser, or moles that have changed in shape or color.  Stay current with required vaccines (immunizations).  Influenza vaccine. All adults should be immunized every year.  Tetanus, diphtheria, and acellular pertussis (Td, Tdap) vaccine. An adult who has not previously received Tdap or who does not know his vaccine status should receive 1 dose of Tdap. This initial dose should be followed by tetanus and diphtheria toxoids (Td) booster doses every 10 years. Adults with an unknown or incomplete history of completing a 3-dose immunization series with Td-containing vaccines should begin or complete a primary immunization series including a Tdap dose. Adults should receive a Td booster every 10 years.  Varicella vaccine. An adult without evidence of immunity to varicella should receive 2 doses or a second dose if he has previously received 1 dose.  Human papillomavirus (HPV) vaccine. Males aged 11-21 years who have not received the vaccine previously should receive the 3-dose series. Males aged 22-26 years may be immunized. Immunization is recommended through the age of 23 years for any male who has sex with males  and did not get any or all doses earlier. Immunization is recommended for any person with an immunocompromised condition through the age of 72 years if he did not get any or all doses earlier. During the 3-dose series, the second dose should be obtained 4-8 weeks after the first dose. The third dose should be obtained 24 weeks after the first dose and 16 weeks after the second dose.  Zoster vaccine. One dose is recommended for adults aged 23 years or older unless certain conditions are present.  Measles, mumps, and rubella (MMR) vaccine. Adults born before [birth year] generally are considered immune to measles and mumps. Adults born in [birth year]  or later should have 1 or more doses of MMR vaccine unless there is a contraindication to the vaccine or there is laboratory evidence of immunity to each of the three diseases. A routine second dose of MMR vaccine should be obtained at least 28 days after the first dose for students attending postsecondary schools, health care workers, or international travelers. People who received inactivated measles vaccine or an unknown type of measles vaccine during 1963-1967 should receive 2 doses of MMR vaccine. People who received inactivated mumps vaccine or an unknown type of mumps vaccine before 1979 and are at high risk for mumps infection should consider immunization with 2 doses of MMR vaccine. Unvaccinated health care workers born before [birth year] who lack laboratory evidence of measles, mumps, or rubella immunity or laboratory confirmation of disease should consider measles and mumps immunization with 2 doses of MMR vaccine or rubella immunization with 1 dose of MMR vaccine.  Pneumococcal 13-valent conjugate (PCV13) vaccine. When indicated, a person who is uncertain of his immunization history and has no record of immunization should receive the PCV13 vaccine. All adults 9 years of age and older should receive this vaccine. An adult aged 69 years or older who has certain medical  conditions and has not been previously immunized should receive 1 dose of PCV13 vaccine. This PCV13 should be followed with a dose of pneumococcal polysaccharide (PPSV23) vaccine. Adults who are at high risk for pneumococcal disease should obtain the PPSV23 vaccine at least 8 weeks after the dose of PCV13 vaccine. Adults older than 73 years of age who have normal immune system function should obtain the PPSV23 vaccine dose at least 1 year after the dose of PCV13 vaccine.  Pneumococcal polysaccharide (PPSV23) vaccine. When PCV13 is also indicated, PCV13 should be obtained first. All adults aged 79 years and older should be immunized. An adult younger than age 43 years who has certain medical conditions should be immunized. Any person who resides in a nursing home or long-term care facility should be immunized. An adult smoker should be immunized. People with an immunocompromised condition and certain other conditions should receive both PCV13 and PPSV23 vaccines. People with human immunodeficiency virus (HIV) infection should be immunized as soon as possible after diagnosis. Immunization during chemotherapy or radiation therapy should be avoided. Routine use of PPSV23 vaccine is not recommended for American Indians, [place] Natives, or people younger than 65 years unless there are medical conditions that require PPSV23 vaccine. When indicated, people who have unknown immunization and have no record of immunization should receive PPSV23 vaccine. One-time revaccination 5 years after the first dose of PPSV23 is recommended for people aged 19-64 years who have chronic kidney failure, nephrotic syndrome, asplenia, or immunocompromised conditions. People who received 1-2 doses of PPSV23 before age 70 years should receive another dose of PPSV23 vaccine at age 79 years or later if at least 5 years have passed since the previous dose. Doses of PPSV23 are not needed for people immunized with PPSV23 at or after age 55  years.  Meningococcal vaccine. Adults with asplenia or persistent complement component deficiencies should receive 2 doses of quadrivalent meningococcal conjugate (MenACWY-D) vaccine. The doses should be obtained at least 2 months apart. Microbiologists working with certain meningococcal bacteria, [employer] recruits, people at risk during an outbreak, and people who travel to or live in countries with a high rate of meningitis should be immunized. A first-year college student up through age 64 years who is living in a residence hall should receive a  dose if he did not receive a dose on or after his 16th birthday. Adults who have certain high-risk conditions should receive one or more doses of vaccine.  Hepatitis A vaccine. Adults who wish to be protected from this disease, have chronic liver disease, work with hepatitis A-infected animals, work in hepatitis A research labs, or travel to or work in countries with a high rate of hepatitis A should be immunized. Adults who were previously unvaccinated and who anticipate close contact with an international adoptee during the first 60 days after arrival in the [place] from a country with a high rate of hepatitis A should be immunized.  Hepatitis B vaccine. Adults should be immunized if they wish to be protected from this disease, are under age 34 years and have diabetes, have chronic liver disease, have had more than one sex partner in the past 6 months, may be exposed to blood or other infectious body fluids, are household contacts or sex partners of hepatitis B positive people, are clients or workers in certain care facilities, or travel to or work in countries with a high rate of hepatitis B.  Haemophilus influenzae type b (Hib) vaccine. A previously unvaccinated person with asplenia or sickle cell disease or having a scheduled splenectomy should receive 1 dose of Hib vaccine. Regardless of previous immunization, a recipient of a hematopoietic stem cell  transplant should receive a 3-dose series 6-12 months after his successful transplant. Hib vaccine is not recommended for adults with HIV infection. Preventive Service / Frequency Ages 77 to 55  Blood pressure check.** / Every 3-5 years.  Lipid and cholesterol check.** / Every 5 years beginning at age 66.  Hepatitis C blood test.** / For any individual with known risks for hepatitis C.  Skin self-exam. / Monthly.  Influenza vaccine. / Every year.  Tetanus, diphtheria, and acellular pertussis (Tdap, Td) vaccine.** / Consult your health care provider. 1 dose of Td every 10 years.  Varicella vaccine.** / Consult your health care provider.  HPV vaccine. / 3 doses over 6 months, if 45 or younger.  Measles, mumps, rubella (MMR) vaccine.** / You need at least 1 dose of MMR if you were born in 1957 or later. You may also need a second dose.  Pneumococcal 13-valent conjugate (PCV13) vaccine.** / Consult your health care provider.  Pneumococcal polysaccharide (PPSV23) vaccine.** / 1 to 2 doses if you smoke cigarettes or if you have certain conditions.  Meningococcal vaccine.** / 1 dose if you are age 81 to 79 years and a [employer] researcher living in a residence hall, or have one of several medical conditions. You may also need additional booster doses.  Hepatitis A vaccine.** / Consult your health care provider.  Hepatitis B vaccine.** / Consult your health care provider.  Haemophilus influenzae type b (Hib) vaccine.** / Consult your health care provider. Ages 6 to 58  Blood pressure check.** / Every year.  Lipid and cholesterol check.** / Every 5 years beginning at age 89.  Lung cancer screening. / Every year if you are aged 84-80 years and have a 30-pack-year history of smoking and currently smoke or have quit within the past 15 years. Yearly screening is stopped once you have quit smoking for at least 15 years or develop a health problem that would prevent you from having  lung cancer treatment.  Fecal occult blood test (FOBT) of stool. / Every year beginning at age 90 and continuing until age 73. You may not have to do  this test if you get a colonoscopy every 10 years.  Flexible sigmoidoscopy** or colonoscopy.** / Every 5 years for a flexible sigmoidoscopy or every 10 years for a colonoscopy beginning at age 50 and continuing until age 75.  Hepatitis C blood test.** / For all people born from 1945 through 1965 and any individual with known risks for hepatitis C.  Skin self-exam. / Monthly.  Influenza vaccine. / Every year.  Tetanus, diphtheria, and acellular pertussis (Tdap/Td) vaccine.** / Consult your health care provider. 1 dose of Td every 10 years.  Varicella vaccine.** / Consult your health care provider.  Zoster vaccine.** / 1 dose for adults aged 60 years or older.  Measles, mumps, rubella (MMR) vaccine.** / You need at least 1 dose of MMR if you were born in 1957 or later. You may also need a second dose.  Pneumococcal 13-valent conjugate (PCV13) vaccine.** / Consult your health care provider.  Pneumococcal polysaccharide (PPSV23) vaccine.** / 1 to 2 doses if you smoke cigarettes or if you have certain conditions.  Meningococcal vaccine.** / Consult your health care provider.  Hepatitis A vaccine.** / Consult your health care provider.  Hepatitis B vaccine.** / Consult your health care provider.  Haemophilus influenzae type b (Hib) vaccine.** / Consult your health care provider. Ages 65 and over  Blood pressure check.** / Every year.  Lipid and cholesterol check.**/ Every 5 years beginning at age 20.  Lung cancer screening. / Every year if you are aged 55-80 years and have a 30-pack-year history of smoking and currently smoke or have quit within the past 15 years. Yearly screening is stopped once you have quit smoking for at least 15 years or develop a health problem that would prevent you from having lung cancer treatment.  Fecal  occult blood test (FOBT) of stool. / Every year beginning at age 50 and continuing until age 75. You may not have to do this test if you get a colonoscopy every 10 years.  Flexible sigmoidoscopy** or colonoscopy.** / Every 5 years for a flexible sigmoidoscopy or every 10 years for a colonoscopy beginning at age 50 and continuing until age 75.  Hepatitis C blood test.** / For all people born from 1945 through 1965 and any individual with known risks for hepatitis C.  Abdominal aortic aneurysm (AAA) screening.** / A one-time screening for ages 65 to 75 years who are current or former smokers.  Skin self-exam. / Monthly.  Influenza vaccine. / Every year.  Tetanus, diphtheria, and acellular pertussis (Tdap/Td) vaccine.** / 1 dose of Td every 10 years.  Varicella vaccine.** / Consult your health care provider.  Zoster vaccine.** / 1 dose for adults aged 60 years or older.  Pneumococcal 13-valent conjugate (PCV13) vaccine.** / 1 dose for all adults aged 65 years and older.  Pneumococcal polysaccharide (PPSV23) vaccine.** / 1 dose for all adults aged 65 years and older.  Meningococcal vaccine.** / Consult your health care provider.  Hepatitis A vaccine.** / Consult your health care provider.  Hepatitis B vaccine.** / Consult your health care provider.  Haemophilus influenzae type b (Hib) vaccine.** / Consult your health care provider. **Family history and personal history of risk and conditions may change your health care provider's recommendations.   This information is not intended to replace advice given to you by your health care provider. Make sure you discuss any questions you have with your health care provider.   Document Released: 07/27/2001 Document Revised: 06/21/2014 Document Reviewed: 10/26/2010 Elsevier Interactive Patient Education 2016   [employer].

## 2015-07-17 NOTE — Progress Notes (Signed)
Subjective:   William [name] is a 75 y.o. male who presents for Medicare Annual/Subsequent preventive examination.  Review of Systems:   Review of Systems  Constitutional: Negative for activity change, appetite change and fatigue.  HENT: Negative for hearing loss, congestion, tinnitus and ear discharge.   Eyes: Negative for visual disturbance (see optho q1y -- vision corrected to 20/20 with glasses).  Respiratory: Negative for cough, chest tightness and shortness of breath.   Cardiovascular: Negative for chest pain, palpitations and leg swelling.  Gastrointestinal: Negative for abdominal pain, diarrhea, constipation and abdominal distention.  Genitourinary: Negative for urgency, frequency, decreased urine volume and difficulty urinating.  Musculoskeletal: Negative for back pain, arthralgias and gait problem.  Skin: Negative for color change, pallor and rash.  Neurological: Negative for dizziness, light-headedness, numbness and headaches.  Hematological: Negative for adenopathy. Does not bruise/bleed easily.  Psychiatric/Behavioral: Negative for suicidal ideas, confusion, sleep disturbance, self-injury, dysphoric mood,  Pt is able to read and write and can do all ADLs No risk for falling No abuse/ violence in home           Objective:    Vitals: BP 162/79 mmHg  Pulse 63  Temp(Src) 98 F (36.7 C) (Oral)  Ht _0  (1.803 m)  Wt 236 lb 3.2 oz (107.14 kg)  BMI 32.96 kg/m2  SpO2 99% BP 162/79 mmHg  Pulse 63  Temp(Src) 98 F (36.7 C) (Oral)  Ht _1  (1.803 m)  Wt 236 lb 3.2 oz (107.14 kg)  BMI 32.96 kg/m2  SpO2 99% General appearance: alert, cooperative, appears stated age and no distress Head: Normocephalic, without obvious abnormality, atraumatic Eyes: conjunctivae/corneas clear. PERRL, EOM's intact. Fundi benign. Ears: normal TM's and external ear canals both ears Nose: Nares normal. Septum midline. Mucosa normal. No drainage or sinus tenderness. Throat: lips,  mucosa, and tongue normal; teeth and gums normal Neck: no adenopathy, no carotid bruit, no JVD, supple, symmetrical, trachea midline and thyroid not enlarged, symmetric, no tenderness/mass/nodules Back: symmetric, no curvature. ROM normal. No CVA tenderness. Lungs: clear to auscultation bilaterally Chest wall: no tenderness Heart: S1, S2 normal Abdomen: soft, non-tender; bowel sounds normal; no masses,  no organomegaly Male genitalia: deferred--urology Rectal: deferred-urology Extremities: extremities normal, atraumatic, no cyanosis or edema Pulses: 2+ and symmetric Skin: Skin color, texture, turgor normal. No rashes or lesions Lymph nodes: Cervical, supraclavicular, and axillary nodes normal. Neurologic: Alert and oriented X 3, normal strength and tone. Normal symmetric reflexes. Normal coordination and gait Psych- no depression, no anxiety Tobacco History  Smoking status  . Former Smoker -- 2.00 packs/day for 40 years  . Types: Cigarettes  . Quit date: 02/15/2005  Smokeless tobacco  . Never Used     Counseling given: Not Answered   Past Medical History  Diagnosis Date  . Hyperlipidemia   . ANXIETY   . Urinary frequency   . BPH (benign prostatic hypertrophy)   . Eczema   . Bladder stone   . History of carotid artery stenosis     S/P BILATERAL CEA  . S/P carotid endarterectomy     BILATERAL ICA--  PATENT PER DUPLEX  05-19-2012  . CVD (cerebrovascular disease)   . Vitamin D deficiency   . 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 GERD (gastroesophageal reflux disease)   . Nocturia   . Stroke [facility]) 1996    left side weakness  . Carotid artery occlusion   . 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 Carotid endarterectomy Bilateral LEFT  11-12-2008  DR GREG HAYES    RIGHT ICA  2006  ([facility])  . Inguinal hernia repair Right 11-08-2006  . Cardiovascular stress test  03-27-2012   DR CRENSHAW    LOW RISK LEXISCAN STUDY-- PROBABLE NORMAL PERFUSION AND SOFT TISSUE ATTENUATION/  NO ISCHEMIA/ EF 51%  . Appendectomy  AS CHILD  . Cystoscopy with litholapaxy N/A 02/26/2013    Procedure: CYSTOSCOPY WITH LITHOLAPAXY;  Surgeon: Marcine Matar, MD;  Location: [facility];  Service: Urology;  Laterality: N/A;  . Transurethral resection of prostate N/A 02/26/2013    Procedure: TRANSURETHRAL RESECTION OF THE PROSTATE WITH GYRUS INSTRUMENTS;  Surgeon: Marcine Matar, MD;  Location: [facility];  Service: Urology;  Laterality: N/A;  . Prostate surgery    . Mohs surgery Left 1/ 2016    Dr Margo Aye-- Basal cell  . Eye surgery  Jan. 2016    cataract surgery both eyes   Family History  Problem Relation Age of Onset  . Heart disease Mother     CHF  . Bipolar disorder Mother   . Heart disease Father     CHF   History  Sexual Activity  . Sexual Activity:  . Partners: Female    Outpatient Encounter Prescriptions as of 07/17/2015  Medication Sig  . aspirin 325 MG EC tablet Take 325 mg by mouth daily.  . B Complex Vitamins (VITAMIN B COMPLEX PO) Take by mouth daily.    . Calcium Carb-Cholecalciferol (CALCIUM 1000 + D PO) Take by mouth daily.    . carbamazepine (TEGRETOL-XR) 100 MG 12 hr tablet 1 qam  1  qhs  . Cholecalciferol (VITAMIN D) 2000 UNITS CAPS Take by mouth.  . Eszopiclone (ESZOPICLONE) 3 MG TABS Take 1 tablet (3 mg total) by mouth at bedtime as needed. Take immediately before bedtime  . fenofibrate 160 MG tablet Take 1 tablet (160 mg total) by mouth daily.  . Flaxseed, Linseed, (FLAX SEED OIL PO) Take by mouth daily.    [name] LORazepam (ATIVAN) 0.5 MG tablet 2 bid  . Multiple Vitamin (MULTIVITAMIN) tablet Take 1 tablet by mouth daily.  [name] NEXIUM 40 MG capsule TAKE 1 CAPSULE BY MOUTH AS NEEDED  . rosuvastatin (CRESTOR) 40 MG tablet Take 1 tablet (40 mg total) by mouth daily.  . traMADol (ULTRAM) 50 MG tablet Take 1 tablet (50 mg total) by mouth  every 8 (eight) hours as needed.  . zolpidem (AMBIEN) 10 MG tablet Take 1 tablet (10 mg total) by mouth at bedtime as needed for sleep.  [name] lisinopril (PRINIVIL,ZESTRIL) 10 MG tablet Take 1 tablet (10 mg total) by mouth daily.  . [DISCONTINUED] albuterol (PROVENTIL HFA;VENTOLIN HFA) 108 (90 Base) MCG/ACT inhaler Inhale 2 puffs into the lungs every 6 (six) hours as needed for wheezing or shortness of breath.  . [DISCONTINUED] azithromycin (ZITHROMAX) 250 MG tablet Take 2 tablets by mouth on day 1, followed by 1 tablet by mouth daily for 4 days.  . [DISCONTINUED] benzonatate (TESSALON) 100 MG capsule Take 1 capsule (100 mg total) by mouth 3 (three) times daily as needed.  . [DISCONTINUED] fluticasone (FLONASE) 50 MCG/ACT nasal spray Place 2 sprays into both nostrils daily.   No facility-administered encounter medications on file as of 07/17/2015.    Activities of Daily Living In your present state of health, do you have any difficulty performing the following activities: 07/17/2015  Hearing? N  Vision? N  Difficulty concentrating or making decisions? N  Walking or climbing stairs? N  Dressing or bathing? N  Doing errands, shopping? Y    Patient Care Team: Rosalita Chessman, DO as PCP - General Lelon Perla, MD as Attending Physician (Cardiology) Norma Fredrickson, MD as Consulting Physician (Psychiatry) [name], MD (Dermatology) Franchot Gallo, MD as Consulting Physician (Urology)   Assessment:    cpe Exercise Activities and Dietary recommendations----con't current exercise--walks dogs daily     Goals    None     Fall Risk Fall Risk  07/17/2015 06/27/2014 06/25/2013  Falls in the past year? Yes No No  Number falls in past yr: 2 or more - -  Injury with Fall? Yes - -  Risk Factor Category  High Fall Risk - -   Depression Screen PHQ 2/9 Scores 07/17/2015 06/27/2014 06/25/2013 10/19/2011  PHQ - 2 Score 0 0 0 0    Cognitive Testing --mild dementia-- off meds-- sees psych and doing  better--does not want to go to neuro any more  Immunization History  Administered Date(s) Administered  . Influenza Split 03/23/2011, 03/13/2012  . Influenza Whole 04/20/2007, 03/01/2008, 04/01/2009, 03/27/2010  . Influenza,inj,Quad PF,36+ Mos 04/03/2013, 03/06/2014, 03/03/2015  . Pneumococcal Conjugate-13 06/25/2013  . Pneumococcal Polysaccharide-23 05/30/2009  . Tdap 10/19/2011  . Zoster 05/30/2009   Screening Tests Health Maintenance  Topic Date Due  . INFLUENZA VACCINE  01/13/2016  . COLONOSCOPY  02/27/2020  . TETANUS/TDAP  10/18/2021  . ZOSTAVAX  Completed  . PNA vac Low Risk Adult  Completed      Plan:    During the course of the visit the patient was educated and counseled about the following appropriate screening and preventive services:   Vaccines to include Pneumoccal, Influenza, Hepatitis B, Td, Zostavax, HCV  Electrocardiogram  Cardiovascular Disease  Colorectal cancer screening  Diabetes screening  Prostate Cancer Screening  Glaucoma screening  Nutrition counseling   Smoking cessation counseling  Patient Instructions (the written plan) was given to the patient.   1. Need for hepatitis C screening test  - Hepatitis C antibody  2. Medicare annual wellness visit, subsequent   3. Preventative health care   4. BPH (benign prostatic hypertrophy)  - PSA  5. Hyperlipidemia LDL goal <100  - Comp Met (CMET) - Lipid panel - POCT urinalysis dipstick - CBC with Differential/Platelet  6. Essential hypertension  - Lipid panel - POCT urinalysis dipstick - CBC with Differential/Platelet - lisinopril (PRINIVIL,ZESTRIL) 10 MG tablet; Take 1 tablet (10 mg total) by mouth daily.  Dispense: 30 tablet; Refill: 2  7. Routine history and physical examination of adult    Garnet Koyanagi, DO  07/19/2015

## 2015-07-17 NOTE — Progress Notes (Signed)
Pre visit review using our clinic review tool, if applicable. No additional management support is needed unless otherwise documented below in the visit note. 

## 2015-07-19 DIAGNOSIS — E785 Hyperlipidemia, unspecified: Secondary | ICD-10-CM | POA: Insufficient documentation

## 2015-07-19 DIAGNOSIS — I10 Essential (primary) hypertension: Secondary | ICD-10-CM

## 2015-07-19 HISTORY — DX: Essential (primary) hypertension: I10

## 2015-07-19 NOTE — Assessment & Plan Note (Signed)
con't meds Check labs  Current outpatient prescriptions:  .  aspirin 325 MG EC tablet, Take 325 mg by mouth daily., Disp: , Rfl:  .  B Complex Vitamins (VITAMIN B COMPLEX PO), Take by mouth daily.  , Disp: , Rfl:  .  Calcium Carb-Cholecalciferol (CALCIUM 1000 + D PO), Take by mouth daily.  , Disp: , Rfl:  .  carbamazepine (TEGRETOL-XR) 100 MG 12 hr tablet, 1 qam  1  qhs, Disp: 60 tablet, Rfl: 8 .  Cholecalciferol (VITAMIN D) 2000 UNITS CAPS, Take by mouth., Disp: , Rfl:  .  Eszopiclone (ESZOPICLONE) 3 MG TABS, Take 1 tablet (3 mg total) by mouth at bedtime as needed. Take immediately before bedtime, Disp: 30 tablet, Rfl: 5 .  fenofibrate 160 MG tablet, Take 1 tablet (160 mg total) by mouth daily., Disp: 90 tablet, Rfl: 1 .  Flaxseed, Linseed, (FLAX SEED OIL PO), Take by mouth daily.  , Disp: , Rfl:  .  LORazepam (ATIVAN) 0.5 MG tablet, 2 bid, Disp: 120 tablet, Rfl: 5 .  Multiple Vitamin (MULTIVITAMIN) tablet, Take 1 tablet by mouth daily., Disp: , Rfl:  .  NEXIUM 40 MG capsule, TAKE 1 CAPSULE BY MOUTH AS NEEDED, Disp: 90 capsule, Rfl: 0 .  rosuvastatin (CRESTOR) 40 MG tablet, Take 1 tablet (40 mg total) by mouth daily., Disp: 90 tablet, Rfl: 3 .  traMADol (ULTRAM) 50 MG tablet, Take 1 tablet (50 mg total) by mouth every 8 (eight) hours as needed., Disp: 20 tablet, Rfl: 0 .  zolpidem (AMBIEN) 10 MG tablet, Take 1 tablet (10 mg total) by mouth at bedtime as needed for sleep., Disp: 30 tablet, Rfl: 5 .  lisinopril (PRINIVIL,ZESTRIL) 10 MG tablet, Take 1 tablet (10 mg total) by mouth daily., Disp: 30 tablet, Rfl: 2

## 2015-07-19 NOTE — Assessment & Plan Note (Signed)
con't lisinopril stable 

## 2015-07-21 DIAGNOSIS — S62337A Displaced fracture of neck of fifth metacarpal bone, left hand, initial encounter for closed fracture: Secondary | ICD-10-CM | POA: Diagnosis not present

## 2015-07-21 MED FILL — ESZOPICLONE 3 MG TABLET: 3 | 30 days supply | Qty: 30 | Fill #1

## 2015-07-28 MED FILL — ROSUVASTATIN CALCIUM 40 MG: 40 | 90 days supply | Qty: 90 | Fill #0

## 2015-08-04 MED FILL — TEGRETOL XR 100 MG TABLET: 100 | 30 days supply | Qty: 60 | Fill #2

## 2015-08-04 MED FILL — TAMSULOSIN HCL 0.4 MG CAP: 0.4 | 90 days supply | Qty: 90 | Fill #0

## 2015-08-05 ENCOUNTER — Ambulatory Visit (INDEPENDENT_AMBULATORY_CARE_PROVIDER_SITE_OTHER): Payer: PPO | Admitting: Family Medicine

## 2015-08-05 ENCOUNTER — Encounter: Payer: Self-pay | Admitting: Family Medicine

## 2015-08-05 VITALS — BP 148/78 | HR 60

## 2015-08-05 DIAGNOSIS — I10 Essential (primary) hypertension: Secondary | ICD-10-CM

## 2015-08-05 NOTE — Progress Notes (Signed)
Pre visit review using our clinic review tool, if applicable. No additional management support is needed unless otherwise documented below in the visit note.  Per 07/17/15 OV note: Return in about 2 weeks (around 07/31/2015), or if symptoms worsen or fail to improve, for bp check-- nurse visit.  Pt has already taken lisinopril today.  Per Dr. Etter Sjogren: Continue current medication. Follow up OV in 3 months.   Pt aware of instructions and verbalized understanding.  Follow up appt scheduled 11/04/15.   Dorrene German, RN

## 2015-08-11 MED FILL — LISINOPRIL 10 MG TABLET: 10 | 30 days supply | Qty: 30 | Fill #1

## 2015-08-11 NOTE — Progress Notes (Signed)
William [name]  male [id number] [birth year] 75 y.o. 08/11/2015      Progress Note-Follow Up  Subjective   HPI  Patient is in today for  Chief Complaint  Patient presents with  . Blood Pressure Check    Past Medical History  Diagnosis Date  . Hyperlipidemia   . ANXIETY   . Urinary frequency   . BPH (benign prostatic hypertrophy)   . Eczema   . Bladder stone   . History of carotid artery stenosis     S/P BILATERAL CEA  . S/P carotid endarterectomy     BILATERAL ICA--  PATENT PER DUPLEX  05-19-2012  . CVD (cerebrovascular disease)   . Vitamin D deficiency   . 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 GERD (gastroesophageal reflux disease)   . Nocturia   . Stroke [facility]) 1996    left side weakness  . Carotid artery occlusion   . 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 Carotid endarterectomy Bilateral LEFT  11-12-2008  DR GREG HAYES    RIGHT ICA  2006  ([facility])  . Inguinal hernia repair Right 11-08-2006  . Cardiovascular stress test  03-27-2012  DR CRENSHAW    LOW RISK LEXISCAN STUDY-- PROBABLE NORMAL PERFUSION AND SOFT TISSUE ATTENUATION/  NO ISCHEMIA/ EF 51%  . Appendectomy  AS CHILD  . Cystoscopy with litholapaxy N/A 02/26/2013    Procedure: CYSTOSCOPY WITH LITHOLAPAXY;  Surgeon: Franchot Gallo, MD;  Location: [facility];  Service: Urology;  Laterality: N/A;  . Transurethral resection of prostate N/A 02/26/2013    Procedure: TRANSURETHRAL RESECTION OF THE PROSTATE WITH GYRUS INSTRUMENTS;  Surgeon: Franchot Gallo, MD;  Location: [facility];  Service: Urology;  Laterality: N/A;  . Prostate surgery    . 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 Eye surgery  Jan. 2016    cataract surgery both eyes    Family History  Problem Relation Age of Onset  . Heart disease Mother     CHF  . Bipolar disorder Mother   . Heart  disease Father     CHF    Social History   Social History  . Marital Status: Married    Spouse Name: N/A  . Number of Children: 2  . Years of Education: N/A   Occupational History  .     Social History Main Topics  . Smoking status: Former Smoker -- 2.00 packs/day for 40 years    Types: Cigarettes    Quit date: 02/15/2005  . Smokeless tobacco: Never Used  . Alcohol Use: 0.0 oz/week    0 Standard drinks or equivalent per week     Comment: Occasional  . Drug Use: No  . Sexual Activity:    Partners: Female   Other Topics Concern  . Not on file   Social History Narrative   Exercise--  Walks dogs everyday    Current Outpatient Prescriptions on File Prior to Visit  Medication Sig Dispense Refill  . lisinopril (PRINIVIL,ZESTRIL) 10 MG tablet Take 1 tablet (10 mg total) by mouth daily. 30 tablet 2  . aspirin 325 MG EC tablet Take 325 mg by mouth daily.    . B Complex Vitamins (VITAMIN B COMPLEX PO) Take by mouth daily.      . Calcium Carb-Cholecalciferol (CALCIUM 1000 + D PO) Take  by mouth daily.      . carbamazepine (TEGRETOL-XR) 100 MG 12 hr tablet 1 qam  1  qhs 60 tablet 8  . Cholecalciferol (VITAMIN D) 2000 UNITS CAPS Take by mouth.    . Eszopiclone (ESZOPICLONE) 3 MG TABS Take 1 tablet (3 mg total) by mouth at bedtime as needed. Take immediately before bedtime 30 tablet 5  . fenofibrate 160 MG tablet Take 1 tablet (160 mg total) by mouth daily. 90 tablet 1  . Flaxseed, Linseed, (FLAX SEED OIL PO) Take by mouth daily.      [name] LORazepam (ATIVAN) 0.5 MG tablet 2 bid 120 tablet 5  . Multiple Vitamin (MULTIVITAMIN) tablet Take 1 tablet by mouth daily.    [name] NEXIUM 40 MG capsule TAKE 1 CAPSULE BY MOUTH AS NEEDED 90 capsule 0  . rosuvastatin (CRESTOR) 40 MG tablet Take 1 tablet (40 mg total) by mouth daily. 90 tablet 3  . traMADol (ULTRAM) 50 MG tablet Take 1 tablet (50 mg total) by mouth every 8 (eight) hours as needed. 20 tablet 0  . zolpidem (AMBIEN) 10 MG tablet Take 1  tablet (10 mg total) by mouth at bedtime as needed for sleep. 30 tablet 5   No current facility-administered medications on file prior to visit.    Allergies  Allergen Reactions  . Adhesive [Tape] Other (See Comments)    BLISTER    Review of Systems  ROS  Objective  Filed Vitals:   08/05/15 1021  BP: 148/78  Pulse: 60  SpO2: 97%   There is no weight on file to calculate BMI.  Physical Exam  Physical Exam  Lab Results  Component Value Date   TSH 1.668 10/16/2013   Lab Results  Component Value Date   WBC 8.5 07/17/2015   HGB 13.6 07/17/2015   HCT 41.7 07/17/2015   MCV 92.4 07/17/2015   PLT 294.0 07/17/2015   Lab Results  Component Value Date   GFR 57.01* 07/17/2015   Lab Results  Component Value Date   CHOL 140 07/17/2015   Lab Results  Component Value Date   HDL 54.10 07/17/2015   Lab Results  Component Value Date   LDLCALC 67 07/17/2015   Lab Results  Component Value Date   TRIG 94.0 07/17/2015   Lab Results  Component Value Date   CHOLHDL 3 07/17/2015   Lab Results  Component Value Date   HGBA1C 6.1 06/27/2014      Assessment & Plan  1. Essential hypertension Pt was here for nrs visit only Cont meds rto 3 months

## 2015-08-13 MED FILL — LORazepam 0.5 MG TABS: 0.5 | 30 days supply | Qty: 120 | Fill #2

## 2015-08-18 MED FILL — ESZOPICLONE 3 MG TABLET: 3 | 30 days supply | Qty: 30 | Fill #2

## 2015-09-08 MED FILL — TEGRETOL XR 100 MG TABLET: 100 | 30 days supply | Qty: 60 | Fill #3

## 2015-09-08 MED FILL — LISINOPRIL 10 MG TABLET: 10 | 30 days supply | Qty: 30 | Fill #2

## 2015-09-15 MED FILL — LORazepam 0.5 MG TABS: 0.5 | 30 days supply | Qty: 120 | Fill #3

## 2015-09-18 MED FILL — ESZOPICLONE 3 MG TABLET: 3 | 30 days supply | Qty: 30 | Fill #3

## 2015-09-22 MED FILL — NexIUM 40 MG CPDR: 40 | 90 days supply | Qty: 90 | Fill #0

## 2015-09-23 MED FILL — FENOFIBRATE 160 MG TABLET: 160 | 90 days supply | Qty: 90 | Fill #1

## 2015-10-02 MED FILL — TEGRETOL XR 100 MG TABLET: 100 | 30 days supply | Qty: 60 | Fill #4

## 2015-10-10 ENCOUNTER — Other Ambulatory Visit: Payer: Self-pay | Admitting: Family Medicine

## 2015-10-10 ENCOUNTER — Telehealth: Payer: Self-pay | Admitting: Family Medicine

## 2015-10-10 MED FILL — LISINOPRIL 10 MG TABLET: 10 | 30 days supply | Qty: 30 | Fill #0

## 2015-10-10 MED FILL — LORazepam 0.5 MG TABS: 0.5 | 30 days supply | Qty: 120 | Fill #4

## 2015-10-10 NOTE — Telephone Encounter (Signed)
Form filled out as much as possible and forwarded to Dr. Carollee Herter. JG//CMA

## 2015-10-10 NOTE — Telephone Encounter (Signed)
Pt dropped off a physician's release form for physical activity for dr. Etter Sjogren to signed, documents placed in your tray at front office, pt will pick up when ready.

## 2015-10-13 NOTE — Telephone Encounter (Signed)
Form ready for pick up at the front desk, copy sent for scanning.  Called and lm for pt to return call. When he calls back, he can come by and pick up form. JG//CMA

## 2015-10-13 NOTE — Telephone Encounter (Signed)
Spoke with pt's spouse [name]. She says that someone will be here to pick up as soon as possible.

## 2015-10-21 MED FILL — ESZOPICLONE 3 MG TABLET: 3 | 30 days supply | Qty: 30 | Fill #4

## 2015-10-24 MED FILL — ROSUVASTATIN CALCIUM 40 MG: 40 | 90 days supply | Qty: 90 | Fill #1

## 2015-10-31 ENCOUNTER — Ambulatory Visit (HOSPITAL_COMMUNITY): Payer: Self-pay | Admitting: Psychiatry

## 2015-11-04 ENCOUNTER — Ambulatory Visit (INDEPENDENT_AMBULATORY_CARE_PROVIDER_SITE_OTHER): Payer: PPO | Admitting: Family Medicine

## 2015-11-04 ENCOUNTER — Encounter: Payer: Self-pay | Admitting: Family Medicine

## 2015-11-04 VITALS — BP 146/82 | HR 63 | Temp 98.4°F | Ht 71.0 in | Wt 236.8 lb

## 2015-11-04 DIAGNOSIS — I10 Essential (primary) hypertension: Secondary | ICD-10-CM | POA: Diagnosis not present

## 2015-11-04 DIAGNOSIS — E785 Hyperlipidemia, unspecified: Secondary | ICD-10-CM

## 2015-11-04 NOTE — Patient Instructions (Signed)
Hypertension Hypertension, commonly called high blood pressure, is when the force of blood pumping through your arteries is too strong. Your arteries are the blood vessels that carry blood from your heart throughout your body. A blood pressure reading consists of a higher number over a lower number, such as 110/72. The higher number (systolic) is the pressure inside your arteries when your heart pumps. The lower number (diastolic) is the pressure inside your arteries when your heart relaxes. Ideally you want your blood pressure below 120/80. Hypertension forces your heart to work harder to pump blood. Your arteries may become narrow or stiff. Having untreated or uncontrolled hypertension can cause heart attack, stroke, kidney disease, and other problems. RISK FACTORS Some risk factors for high blood pressure are controllable. Others are not.  Risk factors you cannot control include:   Race. You may be at higher risk if you are African American.  Age. Risk increases with age.  Gender. Men are at higher risk than women before age 45 years. After age 65, women are at higher risk than men. Risk factors you can control include:  Not getting enough exercise or physical activity.  Being overweight.  Getting too much fat, sugar, calories, or salt in your diet.  Drinking too much alcohol. SIGNS AND SYMPTOMS Hypertension does not usually cause signs or symptoms. Extremely high blood pressure (hypertensive crisis) may cause headache, anxiety, shortness of breath, and nosebleed. DIAGNOSIS To check if you have hypertension, your health care provider will measure your blood pressure while you are seated, with your arm held at the level of your heart. It should be measured at least twice using the same arm. Certain conditions can cause a difference in blood pressure between your right and left arms. A blood pressure reading that is higher than normal on one occasion does not mean that you need treatment. If  it is not clear whether you have high blood pressure, you may be asked to return on a different day to have your blood pressure checked again. Or, you may be asked to monitor your blood pressure at home for 1 or more weeks. TREATMENT Treating high blood pressure includes making lifestyle changes and possibly taking medicine. Living a healthy lifestyle can help lower high blood pressure. You may need to change some of your habits. Lifestyle changes may include:  Following the DASH diet. This diet is high in fruits, vegetables, and whole grains. It is low in salt, red meat, and added sugars.  Keep your sodium intake below 2,300 mg per day.  Getting at least 30-45 minutes of aerobic exercise at least 4 times per week.  Losing weight if necessary.  Not smoking.  Limiting alcoholic beverages.  Learning ways to reduce stress. Your health care provider may prescribe medicine if lifestyle changes are not enough to get your blood pressure under control, and if one of the following is true:  You are 18-59 years of age and your systolic blood pressure is above 140.  You are 60 years of age or older, and your systolic blood pressure is above 150.  Your diastolic blood pressure is above 90.  You have diabetes, and your systolic blood pressure is over 140 or your diastolic blood pressure is over 90.  You have kidney disease and your blood pressure is above 140/90.  You have heart disease and your blood pressure is above 140/90. Your personal target blood pressure may vary depending on your medical conditions, your age, and other factors. HOME CARE INSTRUCTIONS    Have your blood pressure rechecked as directed by your health care provider.   Take medicines only as directed by your health care provider. Follow the directions carefully. Blood pressure medicines must be taken as prescribed. The medicine does not work as well when you skip doses. Skipping doses also puts you at risk for  problems.  Do not smoke.   Monitor your blood pressure at home as directed by your health care provider. SEEK MEDICAL CARE IF:   You think you are having a reaction to medicines taken.  You have recurrent headaches or feel dizzy.  You have swelling in your ankles.  You have trouble with your vision. SEEK IMMEDIATE MEDICAL CARE IF:  You develop a severe headache or confusion.  You have unusual weakness, numbness, or feel faint.  You have severe chest or abdominal pain.  You vomit repeatedly.  You have trouble breathing. MAKE SURE YOU:   Understand these instructions.  Will watch your condition.  Will get help right away if you are not doing well or get worse.   This information is not intended to replace advice given to you by your health care provider. Make sure you discuss any questions you have with your health care provider.   Document Released: 05/31/2005 Document Revised: 10/15/2014 Document Reviewed: 03/23/2013 Elsevier Interactive Patient Education 2016 [employer]  

## 2015-11-04 NOTE — Progress Notes (Signed)
Patient ID: William [name], male    DOB: 1941/05/30  Age: 75 y.o. MRN: [id number]    Subjective:  Subjective HPI William [name] presents for bp check --- no complaints.    Review of Systems  Constitutional: Negative for diaphoresis, appetite change, fatigue and unexpected weight change.  Eyes: Negative for pain, redness and visual disturbance.  Respiratory: Negative for cough, chest tightness, shortness of breath and wheezing.   Cardiovascular: Negative for chest pain, palpitations and leg swelling.  Endocrine: Negative for cold intolerance, heat intolerance, polydipsia, polyphagia and polyuria.  Genitourinary: Negative for dysuria, frequency and difficulty urinating.  Neurological: Negative for dizziness, light-headedness, numbness and headaches.    History Past Medical History  Diagnosis Date  . Hyperlipidemia   . ANXIETY   . Urinary frequency   . BPH (benign prostatic hypertrophy)   . Eczema   . Bladder stone   . History of carotid artery stenosis     S/P BILATERAL CEA  . S/P carotid endarterectomy     BILATERAL ICA--  PATENT PER DUPLEX  05-19-2012  . CVD (cerebrovascular disease)   . Vitamin D deficiency   . 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 GERD (gastroesophageal reflux disease)   . Nocturia   . Stroke [facility]) 1996    left side weakness  . Carotid artery occlusion   . 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 2016).   His family history includes Bipolar disorder in his mother; Heart disease in his father and mother.He  reports that he quit smoking about 10 years ago. His smoking use included Cigarettes. He has a 80 pack-year smoking history. He has never used smokeless tobacco. He reports that he drinks alcohol. He reports that he does not use illicit drugs.  Current Outpatient Prescriptions on File Prior to Visit  Medication Sig Dispense Refill  . aspirin 325 MG EC tablet Take 325 mg by mouth daily.    . B Complex Vitamins (VITAMIN B COMPLEX PO) Take by mouth daily.      . Calcium Carb-Cholecalciferol (CALCIUM 1000 + D PO) Take by mouth daily.      . carbamazepine (TEGRETOL-XR) 100 MG 12 hr tablet 1 qam  1  qhs 60 tablet 8  . Cholecalciferol (VITAMIN D) 2000 UNITS CAPS Take by mouth.    . Eszopiclone (ESZOPICLONE) 3 MG TABS Take 1 tablet (3 mg total) by mouth at bedtime as needed. Take immediately before bedtime 30 tablet 5  . Flaxseed, Linseed, (FLAX SEED OIL PO) Take by mouth daily.      [name] LORazepam (ATIVAN) 0.5 MG tablet 2 bid 120 tablet 5  . Multiple Vitamin (MULTIVITAMIN) tablet Take 1 tablet by mouth daily.    [name] NEXIUM 40 MG capsule TAKE 1 CAPSULE BY MOUTH AS NEEDED 90 capsule 0  . traMADol (ULTRAM) 50 MG tablet Take 1 tablet (50 mg total) by mouth every 8 (eight) hours as needed. 20 tablet 0  . zolpidem (AMBIEN) 10 MG tablet Take 1 tablet (10 mg total) by mouth at bedtime as needed for sleep. 30 tablet 5   No current facility-administered  medications on file prior to visit.     Objective:  Objective Physical Exam  Constitutional: He is oriented to person, place, and time. Vital signs are normal. He appears well-developed and well-nourished. He is sleeping.  HENT:  Head: Normocephalic and atraumatic.  Mouth/Throat: Oropharynx is clear and moist.  Eyes: EOM are normal. Pupils are equal, round, and reactive to light.  Neck: Normal range of motion. Neck supple. No thyromegaly present.  Cardiovascular: Normal rate and regular rhythm.   No murmur heard. Pulmonary/Chest: Effort normal and breath  sounds normal. No respiratory distress. He has no wheezes. He has no rales. He exhibits no tenderness.  Musculoskeletal: He exhibits no edema or tenderness.  Neurological: He is alert and oriented to person, place, and time.  Skin: Skin is warm and dry.  Psychiatric: He has a normal mood and affect. His behavior is normal. Judgment and thought content normal.  Nursing note and vitals reviewed.  BP 146/82 mmHg  Pulse 63  Temp(Src) 98.4 F (36.9 C) (Oral)  Ht 5\' 11"  (1.803 m)  Wt 236 lb 12.8 oz (107.412 kg)  BMI 33.04 kg/m2  SpO2 97% Wt Readings from Last 3 Encounters:  11/04/15 236 lb 12.8 oz (107.412 kg)  07/17/15 236 lb 3.2 oz (107.14 kg)  07/09/15 236 lb 9.6 oz (107.321 kg)     Lab Results  Component Value Date   WBC 8.5 07/17/2015   HGB 13.6 07/17/2015   HCT 41.7 07/17/2015   PLT 294.0 07/17/2015   GLUCOSE 93 07/17/2015   CHOL 140 07/17/2015   TRIG 94.0 07/17/2015   HDL 54.10 07/17/2015   LDLCALC 67 07/17/2015   ALT 29 07/17/2015   AST 28 07/17/2015   NA 141 07/17/2015   K 4.1 07/17/2015   CL 106 07/17/2015   CREATININE 1.31 07/17/2015   BUN 18 07/17/2015   CO2 26 07/17/2015   TSH 1.668 10/16/2013   PSA 2.14 07/17/2015   INR 1.0 11/08/2008   HGBA1C 6.1 06/27/2014    No results found.   Assessment & Plan:  Plan I have changed William [name] tamsulosin. I am also having him maintain his (Flaxseed, Linseed, (FLAX SEED OIL PO)), B Complex Vitamins (VITAMIN B COMPLEX PO), Calcium Carb-Cholecalciferol (CALCIUM 1000 + D PO), aspirin, multivitamin, Vitamin D, zolpidem, traMADol, LORazepam, Eszopiclone, carbamazepine, NEXIUM, lisinopril, fenofibrate, and rosuvastatin.  Meds ordered this encounter  Medications  . DISCONTD: tamsulosin (FLOMAX) 0.4 MG CAPS capsule    Sig:   . lisinopril (PRINIVIL,ZESTRIL) 10 MG tablet    Sig: TAKE 1 TABLET (10 MG TOTAL) BY MOUTH DAILY.    Dispense:  30 tablet    Refill:  5  . fenofibrate 160 MG tablet    Sig: Take 1 tablet (160 mg  total) by mouth daily.    Dispense:  90 tablet    Refill:  1  . tamsulosin (FLOMAX) 0.4 MG CAPS capsule    Sig: 1 po qd    Dispense:  30 capsule  . rosuvastatin (CRESTOR) 40 MG tablet    Sig: Take 1 tablet (40 mg total) by mouth daily.    Dispense:  90 tablet    Refill:  3    Problem List Items Addressed This Visit    Hyperlipidemia - Primary   Relevant Medications   lisinopril (PRINIVIL,ZESTRIL) 10 MG tablet   fenofibrate 160 MG tablet   rosuvastatin (CRESTOR) 40 MG tablet   Other Relevant Orders   Lipid panel   Comprehensive metabolic panel    Other  Visit Diagnoses    Essential hypertension        Relevant Medications    lisinopril (PRINIVIL,ZESTRIL) 10 MG tablet    fenofibrate 160 MG tablet    tamsulosin (FLOMAX) 0.4 MG CAPS capsule    rosuvastatin (CRESTOR) 40 MG tablet       Follow-up: Return in about 2 months (around 01/04/2016), or if symptoms worsen or fail to improve, for labs only.  Ann Held, DO

## 2015-11-06 MED FILL — TEGRETOL XR 100 MG TABLET: 100 | 30 days supply | Qty: 60 | Fill #5

## 2015-11-06 MED FILL — LISINOPRIL 10 MG TABLET: 10 | 30 days supply | Qty: 30 | Fill #1

## 2015-11-06 MED FILL — TAMSULOSIN HCL 0.4 MG CAP: 0.4 | 90 days supply | Qty: 90 | Fill #1

## 2015-11-12 ENCOUNTER — Ambulatory Visit (HOSPITAL_COMMUNITY): Payer: Self-pay | Admitting: Psychiatry

## 2015-11-12 MED ORDER — ROSUVASTATIN CALCIUM 40 MG PO TABS: 40.0000 mg | ORAL_TABLET | Freq: Every day | ORAL | Status: DC

## 2015-11-12 MED ORDER — LISINOPRIL 10 MG PO TABS: ORAL_TABLET | ORAL | Status: DC

## 2015-11-12 MED ORDER — TAMSULOSIN HCL 0.4 MG PO CAPS: ORAL_CAPSULE | ORAL | Status: DC

## 2015-11-12 MED ORDER — FENOFIBRATE 160 MG PO TABS: 160.0000 mg | ORAL_TABLET | Freq: Every day | ORAL | Status: DC

## 2015-11-13 MED FILL — LORazepam 0.5 MG TABS: 0.5 | 30 days supply | Qty: 120 | Fill #5

## 2015-11-20 MED FILL — ESZOPICLONE 3 MG TABLET: 3 | 30 days supply | Qty: 30 | Fill #5

## 2015-11-21 ENCOUNTER — Ambulatory Visit (INDEPENDENT_AMBULATORY_CARE_PROVIDER_SITE_OTHER): Payer: PRIVATE HEALTH INSURANCE | Admitting: Psychiatry

## 2015-11-21 ENCOUNTER — Encounter (HOSPITAL_COMMUNITY): Payer: Self-pay | Admitting: Psychiatry

## 2015-11-21 VITALS — BP 136/78 | HR 67 | Ht 70.5 in | Wt 235.0 lb

## 2015-11-21 DIAGNOSIS — F0630 Mood disorder due to known physiological condition, unspecified: Secondary | ICD-10-CM

## 2015-11-21 DIAGNOSIS — IMO0002 Reserved for concepts with insufficient information to code with codable children: Secondary | ICD-10-CM

## 2015-11-21 DIAGNOSIS — I639 Cerebral infarction, unspecified: Secondary | ICD-10-CM

## 2015-11-21 MED ORDER — CARBAMAZEPINE ER 100 MG PO TB12: ORAL_TABLET | ORAL | Status: DC

## 2015-11-21 MED ORDER — LORAZEPAM 0.5 MG PO TABS: ORAL_TABLET | ORAL | Status: DC

## 2015-11-21 NOTE — Progress Notes (Signed)
Patient ID: William [name], male   DOB: Mar 16, 1941, 75 y.o.   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 He seen with his wife [name]. She says he's doing okay. She describes some mild situational anxiety. In many ways the patient is functioning very well. He's joined a gym and is working out. He has a Physiological scientist. For 5 days been looking on the computer to think about buying a Corvette. Noted also is that his wife allow him to drive their car a couple times in the neighborhood. He seems less interested in driving at this time. He is functioning very well. He denies daily depression. He denies anhedonia. He likes to read and watch TV and now is using an eye pad. The patient denies any psychotic symptoms. He is very active and energetic. It has been noted however that he recently was diagnosed with hypertension start on medications. He takes cholesterol medicines at this time. It is noted this patient has a Mini-Mental Status exam on his last visit 23. I general discussion was around periods where he gets anxious. It is hard really to determinate its pathological or dysfunctional but it's uncomfortable according to his wife observes him. Your seems like he is on the go. She claims that his personality and his makeup before the stroke was different. He was, more easy-going now he always feels like he's got a get somewhere. The patient is not agitated. He is not irritable or angry. He drinks no alcohol uses no drugs. Besides the new hypertension generally he is well. He's happy with life. He wishes for nothing different. Total Time spent with patient: 30 minutes  Past Psychiatric History:   Past Medical History:  Past Medical History  Diagnosis Date  . Hyperlipidemia   . ANXIETY   . Urinary frequency   . BPH  (benign prostatic hypertrophy)   . Eczema   . Bladder stone   . History of carotid artery stenosis     S/P BILATERAL CEA  . S/P carotid endarterectomy     BILATERAL ICA--  PATENT PER DUPLEX  05-19-2012  . CVD (cerebrovascular disease)   . Vitamin D deficiency   . 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 GERD (gastroesophageal reflux disease)   . Nocturia   . Stroke [facility]) 1996    left side weakness  . Carotid artery occlusion   . 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 Carotid endarterectomy Bilateral LEFT  11-12-2008  DR GREG HAYES    RIGHT ICA  2006  ([facility])  . Inguinal hernia repair Right 11-08-2006  . Cardiovascular stress test  03-27-2012  DR CRENSHAW    LOW RISK LEXISCAN STUDY-- PROBABLE NORMAL PERFUSION AND SOFT TISSUE ATTENUATION/  NO ISCHEMIA/ EF 51%  . Appendectomy  AS CHILD  . Cystoscopy with litholapaxy N/A 02/26/2013    Procedure: CYSTOSCOPY WITH LITHOLAPAXY;  Surgeon: Franchot Gallo, MD;  [facility];  Service: Urology;  Laterality: N/A;  . Transurethral resection of prostate N/A 02/26/2013    Procedure: TRANSURETHRAL RESECTION OF THE PROSTATE WITH GYRUS INSTRUMENTS;  Surgeon: Franchot Gallo, MD;  Location: [facility];  Service: Urology;  Laterality: N/A;  . Prostate surgery    . 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 Eye surgery  Jan. 2016    cataract surgery both eyes   Family History:  Family History  Problem Relation Age of Onset  . Heart disease Mother     CHF  . Bipolar disorder Mother   . Heart disease Father     CHF   Family Psychiatric  History:  Social History:  History  Alcohol Use  . 0.0 oz/week  . 0 Standard drinks or equivalent per week    Comment: Occasional     History  Drug Use No    Social History   Social History  . Marital Status: Married    Spouse Name: N/A  . Number  of Children: 2  . Years of Education: N/A   Occupational History  .     Social History Main Topics  . Smoking status: Former Smoker -- 2.00 packs/day for 40 years    Types: Cigarettes    Quit date: 02/15/2005  . Smokeless tobacco: Never Used  . Alcohol Use: 0.0 oz/week    0 Standard drinks or equivalent per week     Comment: Occasional  . Drug Use: No  . Sexual Activity:    Partners: Female   Other Topics Concern  . None   Social History Narrative   Exercise--  Engineer, manufacturing everyday   Additional Social History:                         Sleep: Good  Appetite:  Good  Current Medications: Current Outpatient Prescriptions  Medication Sig Dispense Refill  . aspirin 325 MG EC tablet Take 325 mg by mouth daily.    . B Complex Vitamins (VITAMIN B COMPLEX PO) Take by mouth daily.      . Calcium Carb-Cholecalciferol (CALCIUM 1000 + D PO) Take by mouth daily.      . carbamazepine (TEGRETOL-XR) 100 MG 12 hr tablet 1 qam  1  qhs 60 tablet 8  . Cholecalciferol (VITAMIN D) 2000 UNITS CAPS Take by mouth.    . Eszopiclone (ESZOPICLONE) 3 MG TABS Take 1 tablet (3 mg total) by mouth at bedtime as needed. Take immediately before bedtime 30 tablet 5  . fenofibrate 160 MG tablet Take 1 tablet (160 mg total) by mouth daily. 90 tablet 1  . Flaxseed, Linseed, (FLAX SEED OIL PO) Take by mouth daily.      [name] lisinopril (PRINIVIL,ZESTRIL) 10 MG tablet TAKE 1 TABLET (10 MG TOTAL) BY MOUTH DAILY. 30 tablet 5  . LORazepam (ATIVAN) 0.5 MG tablet 2 bid 120 tablet 5  . LORazepam (ATIVAN) 0.5 MG tablet 2 bid  1  qday  prn 150 tablet 5  . Multiple Vitamin (MULTIVITAMIN) tablet Take 1 tablet by mouth daily.    [name] NEXIUM 40 MG capsule TAKE 1 CAPSULE BY MOUTH AS NEEDED 90 capsule 0  . rosuvastatin (CRESTOR) 40 MG tablet Take 1 tablet (40 mg total) by mouth daily. 90 tablet 3  . tamsulosin (FLOMAX) 0.4 MG CAPS capsule 1 po qd 30 capsule   . traMADol (ULTRAM) 50 MG tablet Take 1 tablet (50 mg total) by  mouth every 8 (eight) hours as needed. 20 tablet 0  . zolpidem (AMBIEN) 10 MG tablet Take 1 tablet (10 mg total) by mouth at bedtime as needed for sleep. 30 tablet 5   No current facility-administered  medications for this visit.    Lab Results: No results found for this or any previous visit (from the past 48 hour(s)).  Physical Findings: AIMS:  , ,  ,  ,    CIWA:    COWS:     Musculoskeletal: Strength & Muscle Tone: within normal limits Gait & Station: normal Patient leans: Right  Psychiatric Specialty Exam: ROS  Blood pressure 136/78, pulse 67, height 5' 10.5" (1.791 m), weight 235 lb (106.595 kg).Body mass index is 33.23 kg/(m^2).  General Appearance: Casual  Eye Contact::  Good  Speech:  Clear and Coherent  Volume:  Normal  Mood:  Dysphoric and Euthymic  Affect:  Appropriate  Thought Process:  Coherent  Orientation:  Full (Time, Place, and Person)  Thought Content:  WDL  Suicidal Thoughts:  No  Homicidal Thoughts:  No  Memory:  NA  Judgement:  Good  Insight:  Fair  Psychomotor Activity:  Normal  Concentration:  Good  Recall:  Good  Fund of Knowledge:Good  Language: Good  Akathisia:  No  Handed:  Right  AIMS (if indicated):     Assets:  Desire for Improvement  ADL's:  Intact  Cognition: WNL  Sleep:      Treatment Plan Summary: 11/21/2015, 11:26 AM Today the patient is doing very well. He seems he has not changed cognitively. He still does all his basic ADLs. He has not fallen. He is not irritable. He believes the Tegretol has made him feel calm and center. He takes Ativan 0.5 mg 2 in the morning and 2 at night. Today we agreed on a test trial of adding to her 0.5 mg mid day when necessary when he is going to go out and do things. His wife in [place] assess the effectiveness extra 0.5 mg pill. The patient's having no problems with falls. He is alert clear thinking pleasant easy-going. He is described as being restless and even perhaps anxious at certain times when he  has things he's got a do. On the other hand he works out every day with a trainer and does well. The patient denies depression. He denies suicidal thoughts.. Is not homicidal. This patient return to see me in 3 months for a 30 minute visit. When his next visit we will go ahead and get a Tegretol level and a comprehensive metabolic panel.

## 2015-12-04 MED FILL — LISINOPRIL 10 MG TABLET: 10 | 30 days supply | Qty: 30 | Fill #2

## 2015-12-04 MED FILL — TEGRETOL XR 100 MG TABLET: 100 | 30 days supply | Qty: 60 | Fill #6

## 2015-12-08 MED FILL — LORazepam 0.5 MG TABS: 0.5 | 30 days supply | Qty: 150 | Fill #0

## 2015-12-19 ENCOUNTER — Other Ambulatory Visit: Payer: Self-pay | Admitting: Family Medicine

## 2015-12-19 ENCOUNTER — Other Ambulatory Visit (HOSPITAL_COMMUNITY): Payer: Self-pay | Admitting: Psychiatry

## 2015-12-19 ENCOUNTER — Inpatient Hospital Stay: Admit: 2015-12-19 | Discharge: 2015-12-20 | Primary: Internal Medicine

## 2015-12-19 DIAGNOSIS — D500 Iron deficiency anemia secondary to blood loss (chronic): Principal | ICD-10-CM

## 2015-12-19 DIAGNOSIS — D61818 Other pancytopenia: Secondary | ICD-10-CM

## 2015-12-19 DIAGNOSIS — N184 Chronic kidney disease, stage 4 (severe): Secondary | ICD-10-CM

## 2015-12-19 DIAGNOSIS — D696 Thrombocytopenia, unspecified: Secondary | ICD-10-CM

## 2015-12-19 DIAGNOSIS — F0630 Mood disorder due to known physiological condition, unspecified: Secondary | ICD-10-CM

## 2015-12-19 MED FILL — FENOFIBRATE 160 MG TABLET: 160 | 90 days supply | Qty: 90 | Fill #0

## 2015-12-23 DIAGNOSIS — L820 Inflamed seborrheic keratosis: Secondary | ICD-10-CM | POA: Diagnosis not present

## 2015-12-23 DIAGNOSIS — D225 Melanocytic nevi of trunk: Secondary | ICD-10-CM | POA: Diagnosis not present

## 2015-12-23 DIAGNOSIS — L570 Actinic keratosis: Secondary | ICD-10-CM | POA: Diagnosis not present

## 2015-12-23 DIAGNOSIS — X32XXXA Exposure to sunlight, initial encounter: Secondary | ICD-10-CM | POA: Diagnosis not present

## 2015-12-23 DIAGNOSIS — L821 Other seborrheic keratosis: Secondary | ICD-10-CM | POA: Diagnosis not present

## 2015-12-25 MED FILL — ZOLPIDEM TARTRATE 10 MG TAB: 10 | 30 days supply | Qty: 30 | Fill #0

## 2015-12-25 NOTE — Telephone Encounter (Signed)
Patient has a follow up in August - refilled Ambien enough to get him to his appointment per Dr. Lovena Le.

## 2015-12-26 ENCOUNTER — Ambulatory Visit: Admit: 2015-12-26 | Discharge: 2015-12-27 | Attending: Hematology & Oncology | Primary: Internal Medicine

## 2015-12-26 ENCOUNTER — Inpatient Hospital Stay: Admit: 2015-12-26 | Discharge: 2015-12-27 | Primary: Internal Medicine

## 2015-12-26 DIAGNOSIS — I4891 Unspecified atrial fibrillation: Secondary | ICD-10-CM

## 2015-12-26 DIAGNOSIS — E119 Type 2 diabetes mellitus without complications: Secondary | ICD-10-CM

## 2015-12-26 DIAGNOSIS — Z7982 Long term (current) use of aspirin: Secondary | ICD-10-CM

## 2015-12-26 DIAGNOSIS — F1010 Alcohol abuse, uncomplicated: Secondary | ICD-10-CM

## 2015-12-26 DIAGNOSIS — K5730 Diverticulosis of large intestine without perforation or abscess without bleeding: Secondary | ICD-10-CM

## 2015-12-26 DIAGNOSIS — E785 Hyperlipidemia, unspecified: Secondary | ICD-10-CM

## 2015-12-26 DIAGNOSIS — Z79899 Other long term (current) drug therapy: Secondary | ICD-10-CM

## 2015-12-26 DIAGNOSIS — I509 Heart failure, unspecified: Principal | ICD-10-CM

## 2015-12-26 DIAGNOSIS — I2510 Atherosclerotic heart disease of native coronary artery without angina pectoris: Secondary | ICD-10-CM

## 2015-12-26 DIAGNOSIS — N184 Chronic kidney disease, stage 4 (severe): Principal | ICD-10-CM

## 2015-12-26 DIAGNOSIS — I6350 Cerebral infarction due to unspecified occlusion or stenosis of unspecified cerebral artery: Secondary | ICD-10-CM

## 2015-12-26 DIAGNOSIS — I639 Cerebral infarction, unspecified: Secondary | ICD-10-CM

## 2015-12-26 DIAGNOSIS — D631 Anemia in chronic kidney disease: Secondary | ICD-10-CM

## 2015-12-26 DIAGNOSIS — I10 Essential (primary) hypertension: Secondary | ICD-10-CM

## 2015-12-26 DIAGNOSIS — I130 Hypertensive heart and chronic kidney disease with heart failure and stage 1 through stage 4 chronic kidney disease, or unspecified chronic kidney disease: Secondary | ICD-10-CM

## 2015-12-26 MED ORDER — DEXAMETHASONE SODIUM PHOSPHATE 20 MG/5ML IJ SOLN
20 mg | INTRAVENOUS | Status: CN | PRN
Start: 2015-12-26 — End: ?

## 2015-12-26 MED ORDER — SODIUM CHLORIDE FLUSH 0.9 % IV SOLN
10 mL | Status: CN | PRN
Start: 2015-12-26 — End: ?

## 2015-12-26 MED ORDER — MEPERIDINE HCL 25 MG/ML IJ SOLN
25 mg | INTRAVENOUS | Status: CN | PRN
Start: 2015-12-26 — End: ?

## 2015-12-26 MED ORDER — EPOETIN ALFA 20000 UNIT/ML IJ SOLN
20000 [IU] | Freq: Once | SUBCUTANEOUS | Status: CP
Start: 2015-12-26 — End: ?

## 2015-12-26 MED ORDER — HYDROCORTISONE NA SUCCINATE PF 100 MG IJ SOLR
100 mg | INTRAVENOUS | Status: CN | PRN
Start: 2015-12-26 — End: ?

## 2015-12-26 MED ORDER — HEPARIN SODIUM LOCK FLUSH 100 UNIT/ML IV SOLN
500 [IU] | Status: CN | PRN
Start: 2015-12-26 — End: ?

## 2015-12-26 MED ORDER — EPINEPHRINE 0.3 MG/0.3ML IJ SOAJ
0.3 mg | INTRAMUSCULAR | Status: CN | PRN
Start: 2015-12-26 — End: ?

## 2015-12-26 MED ORDER — FERRIC CARBOXYMALTOSE IVPB
750 mg | Freq: Once | INTRAVENOUS | Status: CN
Start: 2015-12-26 — End: ?

## 2015-12-26 MED ORDER — EPOETIN ALFA 20000 UNIT/ML IJ SOLN
20000 [IU] | Freq: Once | SUBCUTANEOUS | Status: CN
Start: 2015-12-26 — End: ?

## 2015-12-26 MED ORDER — SODIUM CHLORIDE 0.9 % IV SOLN
Freq: Once | INTRAVENOUS | Status: CN
Start: 2015-12-26 — End: ?

## 2015-12-26 MED ORDER — DIPHENHYDRAMINE HCL 50 MG/ML IJ SOLN
25 mg | INTRAVENOUS | Status: CN | PRN
Start: 2015-12-26 — End: ?

## 2015-12-26 MED ORDER — ASPIRIN EC 325 MG PO TBEC: Start: 2015-12-26 — End: 2016-07-02

## 2015-12-26 NOTE — Progress Notes
by gastroenterology. The patient will need to make these arrangements through his PCP at the [facility].    Recommendation/Plan:  1. The patient will receive Procrit 20,000 units subcutaneous today and every 2 weeks.  2. The patient was strongly encouraged to return to his gastroenterologist for an evaluation.  3. We will obtain additional studies today including a CMP, reticulocyte count, LDH, ferritin, iron profile, B12 level, folate level.  4. The patient will return next week for an exam and possible IV Injectafer.  5. As always, Jeffery Gonzales understands that he may return to the office sooner than the scheduled appointment if any questions or concerns arise during the interim.  ?    Return to Clinic: Return in about 4 days (around 12/30/2015).

## 2015-12-26 NOTE — Progress Notes
Medications:   Current Outpatient Prescriptions:   ?  aspirin 325 MG Tablet Delayed Release, , Disp: , Rfl:   ?  atorvastatin (LIPITOR) 40 MG tablet, Take 1 Tablet by mouth nightly., Disp: 30 Tablet, Rfl: 12  ?  clopidogrel (PLAVIX) 75 MG tablet, Take 1 Tablet by mouth daily., Disp: 30 Tablet, Rfl: 12  ?  folic acid 1 MG Tablet, , Disp: , Rfl:   ?  furosemide (LASIX) 40 MG tablet, Take 1 Tablet by mouth 2 times daily., Disp: 30 Tablet, Rfl: 12  ?  lisinopril (PRINIVIL,ZESTRIL) 2.5 MG tablet, Take 1 Tablet by mouth daily., Disp: 30 Tablet, Rfl: 12  ?  oxyCODONE-acetaminophen (PERCOCET) 10-325 MG Tablet, Take 10-325 tablets by mouth daily., Disp: , Rfl:   ?  pantoprazole (PROTONIX) 40 MG tablet, Take 1 Tablet by mouth 2 times daily (before meals)., Disp: 60 Tablet, Rfl: 2  ?  STOOL SOFTENER LAXATIVE DC 240 MG Capsule, Take 240 mg by mouth daily., Disp: , Rfl:   ?  VOLTAREN 1 % Gel topical gel, Apply 1 % N20 topically daily., Disp: , Rfl:       Labs:   Office Visit on 12/26/2015   Component Date Value   ? WBC 12/26/2015 4.26*   ? RBC 12/26/2015 2.70    ? Hemoglobin 12/26/2015 7.0*   ? Hematocrit 12/26/2015 23.8*   ? MCV 12/26/2015 88.1    ? [facility] 12/26/2015 25.9    ? MCHC 12/26/2015 29.4    ? RDW 12/26/2015 15.4    ? Platelet Count 12/26/2015 104*   ? MPV 12/26/2015 8.7    ? Neutrophils Absolute 12/26/2015 3.00    ? Neutrophils % 12/26/2015 70.5    ? Lymphocytes Absolute 12/26/2015 0.47    ? Lymphocytes % 12/26/2015 11.0*   ? Monocytes Absolute 12/26/2015 0.68    ? Monocytes % 12/26/2015 16.0    ? Eosinophils Absolute 12/26/2015 0.10    ? Eosinophils % 12/26/2015 2.3    ? Basophil Absolute 12/26/2015 0.01    ? BASOPHILS 12/26/2015 0.2    Hospital Outpatient Visit on 12/12/2015   Component Date Value   ? WBC 12/12/2015 3.67*   ? RBC 12/12/2015 2.64    ? Hemoglobin 12/12/2015 7.2*   ? Hematocrit 12/12/2015 23.8*   ? MCV 12/12/2015 90.2    ? [place] 12/12/2015 27.3    ? MCHC 12/12/2015 30.3    ? RDW 12/12/2015 14.9

## 2015-12-26 NOTE — Progress Notes
?   Platelet Count 12/12/2015 92*   ? MPV 12/12/2015 8.8    ? Neutrophils Absolute 12/12/2015 2.12    ? Neutrophils % 12/12/2015 57.8    ? Lymphocytes Absolute 12/12/2015 0.66    ? Lymphocytes % 12/12/2015 18.0*   ? Monocytes Absolute 12/12/2015 0.57    ? Monocytes % 12/12/2015 15.5    ? Eosinophils Absolute 12/12/2015 0.31    ? Eosinophils % 12/12/2015 8.4    ? Basophil Absolute 12/12/2015 0.01    ? BASOPHILS 12/12/2015 0.3    ? WBC 12/19/2015 4.06*   ? RBC 12/19/2015 2.74    ? Hemoglobin 12/19/2015 7.4*   ? Hematocrit 12/19/2015 25.0*   ? MCV 12/19/2015 91.2    ? [place] 12/19/2015 27.0    ? MCHC 12/19/2015 29.6    ? RDW 12/19/2015 15.0    ? Platelet Count 12/19/2015 100*   ? MPV 12/19/2015 9.2    ? Neutrophils Absolute 12/19/2015 3.16    ? Neutrophils % 12/19/2015 77.9    ? Lymphocytes Absolute 12/19/2015 0.41    ? Lymphocytes % 12/19/2015 10.1*   ? Monocytes Absolute 12/19/2015 0.35    ? Monocytes % 12/19/2015 8.6    ? Eosinophils Absolute 12/19/2015 0.13    ? Eosinophils % 12/19/2015 3.2    ? Basophil Absolute 12/19/2015 0.01    ? BASOPHILS 12/19/2015 0.2    Hospital Outpatient Visit on 11/28/2015   Component Date Value   ? WBC 11/28/2015 3.97    ? RBC 11/28/2015 2.70    ? Hemoglobin 11/28/2015 7.4*   ? Hematocrit 11/28/2015 24.9*   ? MCV 11/28/2015 92.2    ? [facility] 11/28/2015 27.4    ? MCHC 11/28/2015 29.7    ? RDW 11/28/2015 14.7    ? Platelet Count 11/28/2015 88*   ? MPV 11/28/2015 9.3    ? Neutrophils Absolute 11/28/2015 2.55    ? Neutrophils % 11/28/2015 64.1    ? Lymphocytes Absolute 11/28/2015 0.61    ? Lymphocytes % 11/28/2015 15.4*   ? Monocytes Absolute 11/28/2015 0.42    ? Monocytes % 11/28/2015 10.6    ? Eosinophils Absolute 11/28/2015 0.38    ? Eosinophils % 11/28/2015 9.6    ? Basophil Absolute 11/28/2015 0.01    ? BASOPHILS 11/28/2015 0.3    Hospital Outpatient Visit on 11/14/2015   Component Date Value   ? WBC 11/14/2015 4.02*   ? RBC 11/14/2015 2.80*   ? Hemoglobin 11/14/2015 8.1*

## 2015-12-26 NOTE — Progress Notes
Occupational History   ? Not on file.     Social History Main Topics   ? Smoking status: Never Smoker   ? Smokeless tobacco: Not on file   ? Alcohol use 216.0 oz/week     18 Cans of beer per week   ? Drug use: No   ? Sexual activity: No     Other Topics Concern   ? Not on file     Social History Narrative       Family History:    Family History   Problem Relation Age of Onset   ? Diabetes Mother    ? Cancer Father        Vital Signs:   Vitals:    12/26/15 1117   BP: 126/61   Pulse: 82   Resp: 18   Temp: 36.4 ?C (97.6 ?F)   Weight: 103.4 kg (228 lb)   Height: 1.778 m (5\' 10" )       Allergies: No known drug allergy    Review of Systems:  Constitutional: Weight loss  Fatigue  No loss of appetite  No night sweats  No fever  No chills   Weight loss due to diuresis  Eyes: No blurred vision  No double vision  No difficulty seeing  Ears,Nose, Mouth,Throat: No hearing loss  No ringing in ears  No sinus trouble  No difficulty swallowing  No sore throat  No nasal drainage  No nose bleeds  No hoarseness  Cardiac:No heart papitations  No lightheadedness  Swelling in legs: much improved  Respiratory:No cough  No sputum production  No hemoptysis  No shortness of breath  No orthopnea  Gastrointestinal:No nausea  No vomiting  No heartburn  No constipation  No diarrhea  No abdominal pain  No rectal bleeding  No Bowel incontinence  Genitourinary:No burning on urination  No pain with urination  No blood in urine  No frequent urination  No urinary incontinence  Musculosketal:No muscle pain  No stiffness  No joint pain  No joint swelling  No back pain  Skin:No skin rash  Neurological:No seizures  No dizziness  No loss of balance  No weakness of limbs  No loss of sensation  Tingling sensations bilateral hands  No memory loss  No difficulty talking  Psychiatric:No nervousness  No depression  No restlessness  No difficulty sleeping  Hematologic/Lymphatic/Immunologic:No bleeding  No lumps in arm pits  No lumps in neck  No lumps in groin

## 2015-12-26 NOTE — Progress Notes
?   Hematocrit 11/14/2015 26.8*   ? MCV 11/14/2015 95.7    ? [place] 11/14/2015 28.9    ? MCHC 11/14/2015 30.2*   ? RDW 11/14/2015 14.2    ? Platelet Count 11/14/2015 100*   ? MPV 11/14/2015 9.2*   ? Neutrophils Absolute 11/14/2015 2.79    ? Neutrophils % 11/14/2015 69.4    ? Lymphocytes Absolute 11/14/2015 0.47    ? Lymphocytes % 11/14/2015 11.7*   ? Monocytes Absolute 11/14/2015 0.39    ? Monocytes % 11/14/2015 9.7*   ? Eosinophils Absolute 11/14/2015 0.36    ? Eosinophils % 11/14/2015 9.0*   ? Basophil Absolute 11/14/2015 0.01    ? BASOPHILS 11/14/2015 0.2    Office Visit on 10/31/2015   Component Date Value   ? WBC 10/31/2015 3.89*   ? RBC 10/31/2015 2.93*   ? Hemoglobin 10/31/2015 8.8*   ? Hematocrit 10/31/2015 28.9*   ? MCV 10/31/2015 98.6    ? [facility] 10/31/2015 30.0    ? MCHC 10/31/2015 30.4*   ? RDW 10/31/2015 13.9    ? Platelet Count 10/31/2015 89*   ? MPV 10/31/2015 9.0*   ? Neutrophils Absolute 10/31/2015 2.28    ? Neutrophils % 10/31/2015 58.6    ? Lymphocytes Absolute 10/31/2015 0.58    ? Lymphocytes % 10/31/2015 14.9*   ? Monocytes Absolute 10/31/2015 0.49    ? Monocytes % 10/31/2015 12.6*   ? Eosinophils Absolute 10/31/2015 0.53    ? Eosinophils % 10/31/2015 13.6*   ? Basophil Absolute 10/31/2015 0.01    ? BASOPHILS 10/31/2015 0.3          Imaging Results:     Health Maintenance: Colonoscopy. Last done:2015. TRANSFUSION HISTORY:Patient has received blood products in the past.    Physical Exam:   General: Well developed, well nourished, No acute Distress, Gentleman patient and Elderly appearance  Head:Atraumatic and normocephalic  Eyes:PERRLA, EOMS intact, No jaundice, pallor and Conjunctiva clear  Ears, Nose, Throat, Neck, and Mouth: Trachea Midline, No JVD, No Lymphadenopathy, Thyroid midline-normal, Neck supple and No oral lesions  Cardiovascular:S1,S2 no murmurs and Regular heart beat  Chest:Symmetrical, No kyphosis and No scoliosis  Respiratory:Clear, No rales/Rhonchi

## 2015-12-26 NOTE — Patient Instructions
Pt labs done today  Injection today  Pt to f/u in 1 week with possible iron lab draw

## 2015-12-26 NOTE — Progress Notes
Gastrointestinal:Abdomen soft, Abdomen non-tender, Abdomen non-distended, Abdomen without masses, No ascites and No hepatosplenomegaly  Hematologic/Lymphatic:No petechiae, No purpura, No neck lymphadenopathy, No Axillary lymphadenopathy and No Groin lymphadenopathy  Musculoskeletal: ambulates with the assistance of a cane.  Extremities:+1 edema present, No cyanosis, No digital clubbing and No discoloration  Neurologic:Alert and oriented, No Hemiparesis and No motor deficits    Assessment:  Mr. Jeffery Gonzales is a 75 year old male who was referred to the hematology office for an evaluation of anemia. This was most consistent with iron deficiency anemia. The patient completed a GI evaluation which did not reveal a specific source of bleeding. However, I suspect that the patient likely has had chronic GI blood loss resulting in the anemia. The patient has a history of prior GI blood loss which was exacerbated by Xarelto. The patient was on anticoagulation due to the congestive heart failure and atrial fibrillation however this has since been discontinued. He is currently only on aspirin therapy alone.  ??  The patient was again noted to have iron deficiency. He was transfused with PRBCs and completed a course of parenteral iron using Injectafer.  The patient denies any overt bleeding issues. There were no findings of B12 deficiency, folate deficiency, or hemolysis. The bone marrow biopsy did not show evidence of leukemia, lymphoma, myelodysplasia, or myelofibrosis. The changes noted in the bone marrow were felt consistent with a reactive process. The patient has received several courses of parenteral iron infusions. The patient has been noted to have slowly progressive anemia. I have again expressed to the patient my concern that he is having chronic low-grade GI blood loss resulting in the iron deficiency. I would again strongly encourage the patient to be evaluated

## 2015-12-26 NOTE — Progress Notes
orthopedics and pain management. The patient received dose 2 of Injectafer in February 2016, July 2016, Septmeber 2016 and February 2017.   ??  The patient underwent a bone marrow biopsy and aspirate 04/28/15 to evaluate for possible MDS. This revealed a hyperplastic marrow (65% cellularity) with mild myeloid hyperplasia and erythroid hyperplasia. Flow cytometry showed no evidence of acute leukemia, B-cell lymphproliferative process or T-cell aberrancy. Cytogenetics: 47 XY karyotype. FISH analysis: Normal results seen for 3 every week, 5, 7, 8, 11(MLL), 12(ETV6), 17, 19, 20. This was interpreted as a normal result. In the absence of a cytogenetic/fish abnormality, the changes are likely reactive. Clinical correlation and followup are recommended.  ??  The patient received a transfusion in November 2016 and has received several doses of intravenous iron. The patient still has symptoms of fatigue but states that overall he is improved. The patient denies symptoms of bright red blood per rectum or black stools. The patient states that he has not been evaluated by gastroenterology since the prior visit.  ??  The patient began Procrit 20,000 unit sq every 2 weeks. I have discussed with the patient that the hemoglobin has been on a downward trend.  I am concerned that he has again developed chronic GI blood loss.  I would recommend repeating the anemia evaluation and administering intravenous iron if required.  We have discussed arranging for a transfusion.  The patient states that he is feeling well, has no shortness of breath or chest pain and does not wish to proceed with a transfusion at this time.  Past Surgical History:   Past Surgical History:   Procedure Laterality Date   ? APPENDECTOMY     ? CORONARY ANGIOPLASTY WITH STENT PLACEMENT      x 2       Social History:    Social History     Social History   ? Marital status: Divorced     Spouse name: N/A   ? Number of children: N/A   ? Years of education: N/A

## 2015-12-26 NOTE — Progress Notes
Patient Name: Jeffery Gonzales    Date: 12/26/2015    Date Of Birth: 12/20/1940    History of Present Illness:  Mr. Paris is a 75 year old Caucasian male who was referred to the hematology office for an evaluation of anemia. The patient has undergone an evaluation by Dr. Carolina Cellar at [facility] due to iron deficiency anemia. The patient has a history of a small bowel GI bleed in 2013 and a second episode in February 2015 most recently occurring in the setting of Xarelto use. The patient has required several transfusions in the past. The patient denies any recent evidence of bleeding specifically no bright red blood per rectum or black stools. He underwent a colonoscopy exam 10/17/2013 which revealed a 3 mm sessile polyp in the descending colon which was removed, a 3 mm sessile polyp in the descending colon which was removed, a diminutive sessile polyp in the sigmoid colon which was removed, severe diverticulosis and otherwise a normal exam. An upper endoscopy obtained 10/17/2013 revealed patchy erythema in the antrum of the stomach but otherwise a normal exam. A capsule endoscopy obtained 10/18/2013 revealed a small erythematous spot in the proximal-mi jejunum without stigmata for bleeding. The remainder of the small bowel exam was normal. As there were no overt bleeding sites identified on the GI evaluation the patient was referred to the hematology office for additional assessment of the anemia and to evaluate for a possible myelodysplasia.  ??  The patient has a significant history of coronary artery disease, diabetes, congestive heart failure, hypertension, hyperlipidemia. He states that he fell and required hip surgery in March 2015.  ??  The patient received a dose of Injectafer 06/18/2014 but did not return the following week to receive cycle 2. The patient states that he had been suffering from persistent pain in the hip and is being evaluated by

## 2015-12-29 MED FILL — TEGRETOL XR 100 MG TABLET: 100 | 30 days supply | Qty: 60 | Fill #7

## 2015-12-30 ENCOUNTER — Ambulatory Visit: Admit: 2015-12-30 | Discharge: 2015-12-31 | Attending: Hematology & Oncology | Primary: Internal Medicine

## 2015-12-30 ENCOUNTER — Inpatient Hospital Stay: Admit: 2015-12-30 | Discharge: 2015-12-31 | Primary: Internal Medicine

## 2015-12-30 DIAGNOSIS — E119 Type 2 diabetes mellitus without complications: Secondary | ICD-10-CM

## 2015-12-30 DIAGNOSIS — Z955 Presence of coronary angioplasty implant and graft: Secondary | ICD-10-CM

## 2015-12-30 DIAGNOSIS — E785 Hyperlipidemia, unspecified: Secondary | ICD-10-CM

## 2015-12-30 DIAGNOSIS — N184 Chronic kidney disease, stage 4 (severe): Secondary | ICD-10-CM

## 2015-12-30 DIAGNOSIS — I4891 Unspecified atrial fibrillation: Secondary | ICD-10-CM

## 2015-12-30 DIAGNOSIS — Z79899 Other long term (current) drug therapy: Secondary | ICD-10-CM

## 2015-12-30 DIAGNOSIS — I110 Hypertensive heart disease with heart failure: Secondary | ICD-10-CM

## 2015-12-30 DIAGNOSIS — D631 Anemia in chronic kidney disease: Secondary | ICD-10-CM

## 2015-12-30 DIAGNOSIS — I2510 Atherosclerotic heart disease of native coronary artery without angina pectoris: Secondary | ICD-10-CM

## 2015-12-30 DIAGNOSIS — Z7982 Long term (current) use of aspirin: Secondary | ICD-10-CM

## 2015-12-30 DIAGNOSIS — I509 Heart failure, unspecified: Secondary | ICD-10-CM

## 2015-12-30 DIAGNOSIS — D500 Iron deficiency anemia secondary to blood loss (chronic): Secondary | ICD-10-CM

## 2015-12-30 DIAGNOSIS — F1010 Alcohol abuse, uncomplicated: Secondary | ICD-10-CM

## 2015-12-30 DIAGNOSIS — I10 Essential (primary) hypertension: Secondary | ICD-10-CM

## 2015-12-30 DIAGNOSIS — D509 Iron deficiency anemia, unspecified: Secondary | ICD-10-CM

## 2015-12-30 DIAGNOSIS — I639 Cerebral infarction, unspecified: Secondary | ICD-10-CM

## 2015-12-30 DIAGNOSIS — I6350 Cerebral infarction due to unspecified occlusion or stenosis of unspecified cerebral artery: Secondary | ICD-10-CM

## 2015-12-30 MED ORDER — DIPHENHYDRAMINE HCL 50 MG/ML IJ SOLN
25 mg | INTRAVENOUS | Status: CN | PRN
Start: 2015-12-30 — End: ?

## 2015-12-30 MED ORDER — DEXAMETHASONE SODIUM PHOSPHATE 20 MG/5ML IJ SOLN
20 mg | INTRAVENOUS | Status: CN | PRN
Start: 2015-12-30 — End: ?

## 2015-12-30 MED ORDER — SODIUM CHLORIDE 0.9 % IV SOLN
Freq: Once | INTRAVENOUS | Status: CP
Start: 2015-12-30 — End: ?

## 2015-12-30 MED ORDER — SODIUM CHLORIDE 0.9 % IV SOLN
Freq: Once | INTRAVENOUS | Status: CN
Start: 2015-12-30 — End: ?

## 2015-12-30 MED ORDER — SODIUM CHLORIDE FLUSH 0.9 % IV SOLN
10 mL | Status: CN | PRN
Start: 2015-12-30 — End: ?

## 2015-12-30 MED ORDER — FERRIC CARBOXYMALTOSE IVPB
750 mg | Freq: Once | INTRAVENOUS | Status: DC
Start: 2015-12-30 — End: 2015-12-31

## 2015-12-30 MED ORDER — HEPARIN SODIUM LOCK FLUSH 100 UNIT/ML IV SOLN
500 [IU] | Status: CN | PRN
Start: 2015-12-30 — End: ?

## 2015-12-30 MED ORDER — FERRIC CARBOXYMALTOSE IVPB
750 mg | Freq: Once | INTRAVENOUS | Status: CN
Start: 2015-12-30 — End: ?

## 2015-12-30 NOTE — Progress Notes
?   Monocytes % 12/30/2015 13.0    ? Eosinophils Absolute 12/30/2015 0.19    ? Eosinophils % 12/30/2015 4.1    ? Basophil Absolute 12/30/2015 0.02    ? BASOPHILS 12/30/2015 0.4    Office Visit on 12/26/2015   Component Date Value   ? WBC 12/26/2015 4.26*   ? RBC 12/26/2015 2.70    ? Hemoglobin 12/26/2015 7.0*   ? Hematocrit 12/26/2015 23.8*   ? MCV 12/26/2015 88.1    ? [facility] 12/26/2015 25.9    ? MCHC 12/26/2015 29.4    ? RDW 12/26/2015 15.4    ? Platelet Count 12/26/2015 104*   ? MPV 12/26/2015 8.7    ? Neutrophils Absolute 12/26/2015 3.00    ? Neutrophils % 12/26/2015 70.5    ? Lymphocytes Absolute 12/26/2015 0.47    ? Lymphocytes % 12/26/2015 11.0*   ? Monocytes Absolute 12/26/2015 0.68    ? Monocytes % 12/26/2015 16.0    ? Eosinophils Absolute 12/26/2015 0.10    ? Eosinophils % 12/26/2015 2.3    ? Basophil Absolute 12/26/2015 0.01    ? BASOPHILS 12/26/2015 0.2    Hospital Outpatient Visit on 12/26/2015   Component Date Value   ? Vitamin B-12 12/26/2015 546    ? Sodium 12/26/2015 136    ? Potassium 12/26/2015     ? Chloride 12/26/2015 101    ? CO2 12/26/2015 24    ? Urea Nitrogen 12/26/2015 57*   ? Creatinine 12/26/2015 1.44*   ? BUN/Creatinine Ratio 12/26/2015 39.6*   ? Glucose 12/26/2015 87    ? Calcium 12/26/2015 8.4*   ? Total Protein 12/26/2015 7.4    ? Albumin 12/26/2015 4.3    ? Calc Total Globuin 12/26/2015 3.1    ? ALBUMIN/GLOBULIN RATIO 12/26/2015 1.4    ? Total Bilirubin 12/26/2015 0.4    ? Alkaline Phosphatase 12/26/2015 113    ? AST 12/26/2015 19    ? ALT 12/26/2015 16    ? Anion Gap 12/26/2015 11    ? EGFR 12/26/2015 48    ? Ferritin 12/26/2015 21.0*   ? Folate 12/26/2015 >20.0*   ? Iron 12/26/2015 23*   ? Transferrin 12/26/2015 321    ? TIBC 12/26/2015 408*   ? Iron Saturation 12/26/2015 6*   ? LD Total 12/26/2015 183    ? Retic Ct Pct 12/26/2015 1.0    ? Absolute Reticulocyte Co* 12/26/2015 0.0264    ? Reticulated Hemoglobin 12/26/2015 20.8[facility] Outpatient Visit on 12/12/2015

## 2015-12-30 NOTE — Progress Notes
Component Date Value   ? WBC 12/12/2015 3.67*   ? RBC 12/12/2015 2.64    ? Hemoglobin 12/12/2015 7.2*   ? Hematocrit 12/12/2015 23.8*   ? MCV 12/12/2015 90.2    ? [place] 12/12/2015 27.3    ? MCHC 12/12/2015 30.3    ? RDW 12/12/2015 14.9    ? Platelet Count 12/12/2015 92*   ? MPV 12/12/2015 8.8    ? Neutrophils Absolute 12/12/2015 2.12    ? Neutrophils % 12/12/2015 57.8    ? Lymphocytes Absolute 12/12/2015 0.66    ? Lymphocytes % 12/12/2015 18.0*   ? Monocytes Absolute 12/12/2015 0.57    ? Monocytes % 12/12/2015 15.5    ? Eosinophils Absolute 12/12/2015 0.31    ? Eosinophils % 12/12/2015 8.4    ? Basophil Absolute 12/12/2015 0.01    ? BASOPHILS 12/12/2015 0.3    ? WBC 12/19/2015 4.06*   ? RBC 12/19/2015 2.74    ? Hemoglobin 12/19/2015 7.4*   ? Hematocrit 12/19/2015 25.0*   ? MCV 12/19/2015 91.2    ? [place] 12/19/2015 27.0    ? MCHC 12/19/2015 29.6    ? RDW 12/19/2015 15.0    ? Platelet Count 12/19/2015 100*   ? MPV 12/19/2015 9.2    ? Neutrophils Absolute 12/19/2015 3.16    ? Neutrophils % 12/19/2015 77.9    ? Lymphocytes Absolute 12/19/2015 0.41    ? Lymphocytes % 12/19/2015 10.1*   ? Monocytes Absolute 12/19/2015 0.35    ? Monocytes % 12/19/2015 8.6    ? Eosinophils Absolute 12/19/2015 0.13    ? Eosinophils % 12/19/2015 3.2    ? Basophil Absolute 12/19/2015 0.01    ? BASOPHILS 12/19/2015 0.2    Hospital Outpatient Visit on 11/28/2015   Component Date Value   ? WBC 11/28/2015 3.97    ? RBC 11/28/2015 2.70    ? Hemoglobin 11/28/2015 7.4*   ? Hematocrit 11/28/2015 24.9*   ? MCV 11/28/2015 92.2    ? [facility] 11/28/2015 27.4    ? MCHC 11/28/2015 29.7    ? RDW 11/28/2015 14.7    ? Platelet Count 11/28/2015 88*   ? MPV 11/28/2015 9.3    ? Neutrophils Absolute 11/28/2015 2.55    ? Neutrophils % 11/28/2015 64.1    ? Lymphocytes Absolute 11/28/2015 0.61    ? Lymphocytes % 11/28/2015 15.4*   ? Monocytes Absolute 11/28/2015 0.42    ? Monocytes % 11/28/2015 10.6    ? Eosinophils Absolute 11/28/2015 0.38

## 2015-12-30 NOTE — Progress Notes
iron stores have been repleted.  2. The patient was strongly encouraged to return to his?gastroenterologist?for an evaluation.  3. The patient will receive IV Injectafer 750 mg weekly ?2 doses beginning today.  4. The patient will return for an exam in 5 weeks obtaining a CBC, BMP, ferritin, iron profile shortly prior to the return visit.  5. The patient was again asked to complete stools for Hemoccultand return them to the lab for review.  6. As always, Jeffery Gonzales understands that he may return to the office sooner than the scheduled appointment if any questions or concerns arise during the interim.  ?    Return to Clinic: Return in about 5 weeks (around 02/03/2016).

## 2015-12-30 NOTE — Progress Notes
the patient to return to his gastroenterologist for evaluation and management of the iron deficiency anemia.  Past Surgical History:   Past Surgical History:   Procedure Laterality Date   ? APPENDECTOMY     ? CORONARY ANGIOPLASTY WITH STENT PLACEMENT      x 2       Social History:    Social History     Social History   ? Marital status: Divorced     Spouse name: N/A   ? Number of children: N/A   ? Years of education: N/A     Occupational History   ? Not on file.     Social History Main Topics   ? Smoking status: Never Smoker   ? Smokeless tobacco: Not on file   ? Alcohol use 216.0 oz/week     18 Cans of beer per week   ? Drug use: No   ? Sexual activity: No     Other Topics Concern   ? Not on file     Social History Narrative       Family History:    Family History   Problem Relation Age of Onset   ? Diabetes Mother    ? Cancer Father        Vital Signs:   Vitals:    12/30/15 0925   BP: 113/50   Pulse: 97   Resp: 18   Temp: 36.3 ?C (97.4 ?F)   Weight: 101.5 kg (223 lb 12.8 oz)   Height: 1.778 m (5\' 10" )       Allergies: No known drug allergy    Review of Systems:  Constitutional:?Weight loss  Fatigue  No loss of appetite  No night sweats  No fever  No chills?  Weight loss due to diuresis  Eyes:?No blurred vision  No double vision  No difficulty seeing  Ears,Nose, Mouth,Throat: No hearing loss  No ringing in ears  No sinus trouble  No difficulty swallowing  No sore throat  No nasal drainage  No nose bleeds  No hoarseness  Cardiac:No heart papitations  No lightheadedness  Swelling in legs: much improved  Respiratory:No cough  No sputum production  No hemoptysis  No shortness of breath  No orthopnea  Gastrointestinal:No nausea  No vomiting  No heartburn  No constipation  No diarrhea  No abdominal pain  No rectal bleeding  No Bowel incontinence  Genitourinary:No burning on urination  No pain with urination  No blood in urine  No frequent urination  No urinary incontinence  Musculosketal:No muscle pain  No stiffness

## 2015-12-30 NOTE — Patient Instructions
HOLD Procrit for today/ Pt to receive IV Iron today  Pt to have IV Iron again in 1 week  F/U Exam and possible procrit in 5 weeks  Labs to be done 3 days PTV

## 2015-12-30 NOTE — Progress Notes
Lymphadenopathy, Thyroid midline-normal, Neck supple and No oral lesions  Cardiovascular:S1,S2 no murmurs and Regular heart beat  Chest:Symmetrical, No kyphosis and No scoliosis  Respiratory:Clear, No rales/Rhonchi  Gastrointestinal:Abdomen soft, Abdomen non-tender, Abdomen non-distended, Abdomen without masses, No ascites and No hepatosplenomegaly  Hematologic/Lymphatic:No petechiae, No purpura, No neck lymphadenopathy, No Axillary lymphadenopathy and No Groin lymphadenopathy  Musculoskeletal: ambulates with the assistance of a cane.  Extremities:+1 edema present, No cyanosis, No digital clubbing and No discoloration  Neurologic:Alert and oriented, No Hemiparesis and No motor deficits    Assessment:  Jeffery Gonzales is a 75 year old male who was referred to the hematology office for an evaluation of anemia. This was most consistent with iron deficiency anemia. The patient completed a GI evaluation which did not reveal a specific source of bleeding. However, I suspect that the patient likely has had chronic GI blood loss resulting in the anemia. The patient has a history of prior GI blood loss which was exacerbated by Xarelto. The patient was on anticoagulation due to the congestive heart failure and atrial fibrillation however this has since been discontinued. He is currently only on aspirin therapy alone.  ??  The patient denies any overt bleeding issues. There were no findings of B12 deficiency, folate deficiency, or hemolysis. The bone marrow biopsy did not show evidence of leukemia, lymphoma, myelodysplasia, or myelofibrosis. The changes noted in the bone marrow were felt consistent with a reactive process. The patient has received several courses of parenteral iron infusions. The patient has recently been noted to have slowly progressive anemia which is again consistent with iron deficiency anemia.    Recommendation/Plan:  1. We will hold Procrit 20,000 units subcutaneous every 2 weeks until the

## 2015-12-30 NOTE — Progress Notes
orthopedics and pain management. The patient received dose 2 of Injectafer in February 2016, July 2016, Septmeber 2016 and February 2017.   ??  The patient underwent a bone marrow biopsy and aspirate 04/28/15 to evaluate for possible MDS. This revealed a hyperplastic marrow (65% cellularity) with mild myeloid hyperplasia and erythroid hyperplasia. Flow cytometry showed no evidence of acute leukemia, B-cell lymphproliferative process or T-cell aberrancy. Cytogenetics: 8 XY karyotype. FISH analysis: Normal results seen for 3 every week, 5, 7, 8, 11(MLL), 12(ETV6), 17, 19, 20. This was interpreted as a normal result. In the absence of a cytogenetic/fish abnormality, the changes are likely reactive. Clinical correlation and followup are recommended.  ??  The patient received a transfusion in November 2016 and has received several doses of intravenous iron. The patient still has symptoms of fatigue but states that overall he is improved. The patient denies symptoms of bright red blood per rectum or black stools. The patient states that he has not been evaluated by gastroenterology since the prior visit.  ??  The patient began Procrit 20,000 unit sq every 2 weeks. I had discussed with the patient that the hemoglobin has been on a downward trend.  I am concerned that he has again developed chronic GI blood loss. We have discussed arranging for a transfusion.  The patient states that he is feeling well, has no shortness of breath or chest pain and does not wish to proceed with a transfusion at this time. The patient did agree to proceed with intravenous iron therapy.  We have reviewed the results of the recent laboratory assessment which was consistent with iron deficiency anemia.  I have explained that we will need to hold the Procrit injections as the patient does not qualify currently with a ferritin less than 100 and percent saturation less than 20. I have again also strongly encouraged

## 2015-12-30 NOTE — Addendum Note
Addended by: Terrill Mohr on: 12/30/2015 09:50 AM      Modules accepted: Orders

## 2015-12-30 NOTE — Progress Notes
Patient Name: Stpehen Trojanowski    Date: 12/30/2015    Date Of Birth: [birth year]    History of Present Illness:  Mr. Redgate is a 75 year old Caucasian male who was referred to the hematology office for an evaluation of anemia. The patient has undergone an evaluation by Dr. Carolina Cellar at [facility] due to iron deficiency anemia. The patient has a history of a small bowel GI bleed in 2013 and a second episode in February 2015 most recently occurring in the setting of Xarelto use. The patient has required several transfusions in the past. The patient denies any recent evidence of bleeding specifically no bright red blood per rectum or black stools. He underwent a colonoscopy exam 10/17/2013 which revealed a 3 mm sessile polyp in the descending colon which was removed, a 3 mm sessile polyp in the descending colon which was removed, a diminutive sessile polyp in the sigmoid colon which was removed, severe diverticulosis and otherwise a normal exam. An upper endoscopy obtained 10/17/2013 revealed patchy erythema in the antrum of the stomach but otherwise a normal exam. A capsule endoscopy obtained 10/18/2013 revealed a small erythematous spot in the proximal-mi jejunum without stigmata for bleeding. The remainder of the small bowel exam was normal. As there were no overt bleeding sites identified on the GI evaluation the patient was referred to the hematology office for additional assessment of the anemia and to evaluate for a possible myelodysplasia.  ??  The patient has a significant history of coronary artery disease, diabetes, congestive heart failure, hypertension, hyperlipidemia. He states that he fell and required hip surgery in March 2015.  ??  The patient received a dose of Injectafer 06/18/2014 but did not return the following week to receive cycle 2. The patient states that he had been suffering from persistent pain in the hip and is being evaluated by

## 2015-12-30 NOTE — Progress Notes
?   Eosinophils % 11/28/2015 9.6    ? Basophil Absolute 11/28/2015 0.01    ? BASOPHILS 11/28/2015 0.3    Hospital Outpatient Visit on 11/14/2015   Component Date Value   ? WBC 11/14/2015 4.02*   ? RBC 11/14/2015 2.80*   ? Hemoglobin 11/14/2015 8.1*   ? Hematocrit 11/14/2015 26.8*   ? MCV 11/14/2015 95.7    ? [place] 11/14/2015 28.9    ? MCHC 11/14/2015 30.2*   ? RDW 11/14/2015 14.2    ? Platelet Count 11/14/2015 100*   ? MPV 11/14/2015 9.2*   ? Neutrophils Absolute 11/14/2015 2.79    ? Neutrophils % 11/14/2015 69.4    ? Lymphocytes Absolute 11/14/2015 0.47    ? Lymphocytes % 11/14/2015 11.7*   ? Monocytes Absolute 11/14/2015 0.39    ? Monocytes % 11/14/2015 9.7*   ? Eosinophils Absolute 11/14/2015 0.36    ? Eosinophils % 11/14/2015 9.0*   ? Basophil Absolute 11/14/2015 0.01    ? BASOPHILS 11/14/2015 0.2    Office Visit on 10/31/2015   Component Date Value   ? WBC 10/31/2015 3.89*   ? RBC 10/31/2015 2.93*   ? Hemoglobin 10/31/2015 8.8*   ? Hematocrit 10/31/2015 28.9*   ? MCV 10/31/2015 98.6    ? [facility] 10/31/2015 30.0    ? MCHC 10/31/2015 30.4*   ? RDW 10/31/2015 13.9    ? Platelet Count 10/31/2015 89*   ? MPV 10/31/2015 9.0*   ? Neutrophils Absolute 10/31/2015 2.28    ? Neutrophils % 10/31/2015 58.6    ? Lymphocytes Absolute 10/31/2015 0.58    ? Lymphocytes % 10/31/2015 14.9*   ? Monocytes Absolute 10/31/2015 0.49    ? Monocytes % 10/31/2015 12.6*   ? Eosinophils Absolute 10/31/2015 0.53    ? Eosinophils % 10/31/2015 13.6*   ? Basophil Absolute 10/31/2015 0.01    ? BASOPHILS 10/31/2015 0.3          Imaging Results:     Health Maintenance: Colonoscopy. Last done:2015. TRANSFUSION HISTORY:Patient has received blood products in the past.    Physical Exam:   General: Well developed, well nourished, No acute Distress, Gentleman patient and Elderly appearance  Head:Atraumatic and normocephalic  Eyes:PERRLA, EOMS intact, No jaundice, pallor and Conjunctiva clear  Ears, Nose, Throat, Neck, and Mouth: Trachea Midline, No JVD, No

## 2015-12-30 NOTE — Progress Notes
Post BP 122/68, HR 77.  Patient tolerated treatment well and voiced no complaints.  Patient went to checkout to schedule future appointments.  Patient was ambulatory when he left the treatment area

## 2015-12-30 NOTE — Progress Notes
No joint pain  No joint swelling  No back pain  Skin:No skin rash  Neurological:No seizures  No dizziness  No loss of balance  No weakness of limbs  No loss of sensation  Tingling sensations bilateral hands  No memory loss  No difficulty talking  Psychiatric:No nervousness  No depression  No restlessness  No difficulty sleeping  Hematologic/Lymphatic/Immunologic:No bleeding  No lumps in arm pits  No lumps in neck  No lumps in groin  ?  ?      Medications:   Current Outpatient Prescriptions:   ?  aspirin 325 MG Tablet Delayed Release, , Disp: , Rfl:   ?  atorvastatin (LIPITOR) 40 MG tablet, Take 1 Tablet by mouth nightly., Disp: 30 Tablet, Rfl: 12  ?  clopidogrel (PLAVIX) 75 MG tablet, Take 1 Tablet by mouth daily., Disp: 30 Tablet, Rfl: 12  ?  folic acid 1 MG Tablet, , Disp: , Rfl:   ?  furosemide (LASIX) 40 MG tablet, Take 1 Tablet by mouth 2 times daily., Disp: 30 Tablet, Rfl: 12  ?  lisinopril (PRINIVIL,ZESTRIL) 2.5 MG tablet, Take 1 Tablet by mouth daily., Disp: 30 Tablet, Rfl: 12  ?  oxyCODONE-acetaminophen (PERCOCET) 10-325 MG Tablet, Take 10-325 tablets by mouth daily., Disp: , Rfl:   ?  pantoprazole (PROTONIX) 40 MG tablet, Take 1 Tablet by mouth 2 times daily (before meals)., Disp: 60 Tablet, Rfl: 2  ?  STOOL SOFTENER LAXATIVE DC 240 MG Capsule, Take 240 mg by mouth daily., Disp: , Rfl:   ?  VOLTAREN 1 % Gel topical gel, Apply 1 % N20 topically daily., Disp: , Rfl:       Labs:   Office Visit on 12/30/2015   Component Date Value   ? WBC 12/30/2015 4.61    ? RBC 12/30/2015 2.91    ? Hemoglobin 12/30/2015 7.5*   ? Hematocrit 12/30/2015 25.3*   ? MCV 12/30/2015 86.9    ? [facility] 12/30/2015 25.8    ? MCHC 12/30/2015 29.6    ? RDW 12/30/2015 15.6    ? Platelet Count 12/30/2015 124*   ? MPV 12/30/2015 9.5    ? Neutrophils Absolute 12/30/2015 3.13    ? Neutrophils % 12/30/2015 68.0    ? Lymphocytes Absolute 12/30/2015 0.67    ? Lymphocytes % 12/30/2015 14.5*   ? Monocytes Absolute 12/30/2015 0.60

## 2016-01-06 ENCOUNTER — Ambulatory Visit (INDEPENDENT_AMBULATORY_CARE_PROVIDER_SITE_OTHER): Payer: PPO | Admitting: Family

## 2016-01-06 ENCOUNTER — Other Ambulatory Visit: Payer: Self-pay | Admitting: Family Medicine

## 2016-01-06 ENCOUNTER — Encounter: Payer: Self-pay | Admitting: Family

## 2016-01-06 ENCOUNTER — Inpatient Hospital Stay: Admit: 2016-01-06 | Discharge: 2016-01-07 | Primary: Internal Medicine

## 2016-01-06 DIAGNOSIS — D500 Iron deficiency anemia secondary to blood loss (chronic): Principal | ICD-10-CM

## 2016-01-06 DIAGNOSIS — L02212 Cutaneous abscess of back [any part, except buttock]: Secondary | ICD-10-CM

## 2016-01-06 MED ORDER — DOXYCYCLINE HYCLATE 100 MG PO TABS: 100.0000 mg | ORAL_TABLET | Freq: Two times a day (BID) | ORAL | 0 refills | Status: DC

## 2016-01-06 MED ORDER — SODIUM CHLORIDE 0.9 % IV SOLN
Freq: Once | INTRAVENOUS | Status: CP
Start: 2016-01-06 — End: ?

## 2016-01-06 MED ORDER — DEXAMETHASONE SODIUM PHOSPHATE 20 MG/5ML IJ SOLN
20 mg | INTRAVENOUS | Status: CN | PRN
Start: 2016-01-06 — End: ?

## 2016-01-06 MED ORDER — HEPARIN SODIUM LOCK FLUSH 100 UNIT/ML IV SOLN
500 [IU] | Status: CN | PRN
Start: 2016-01-06 — End: ?

## 2016-01-06 MED ORDER — SODIUM CHLORIDE FLUSH 0.9 % IV SOLN
10 mL | Status: CN | PRN
Start: 2016-01-06 — End: ?

## 2016-01-06 MED ORDER — SODIUM CHLORIDE 0.9 % IV SOLN
Freq: Once | INTRAVENOUS | Status: CN
Start: 2016-01-06 — End: ?

## 2016-01-06 MED ORDER — FERRIC CARBOXYMALTOSE IVPB
750 mg | Freq: Once | INTRAVENOUS | Status: CN
Start: 2016-01-06 — End: ?

## 2016-01-06 MED ORDER — DIPHENHYDRAMINE HCL 50 MG/ML IJ SOLN
25 mg | INTRAVENOUS | Status: CN | PRN
Start: 2016-01-06 — End: ?

## 2016-01-06 MED ORDER — FERRIC CARBOXYMALTOSE IVPB
750 mg | Freq: Once | INTRAVENOUS | Status: DC
Start: 2016-01-06 — End: 2016-01-07

## 2016-01-06 MED FILL — DOXYCYCLINE 100 MG TABLET: 100 | 7 days supply | Qty: 14 | Fill #0

## 2016-01-06 NOTE — Progress Notes
Pt. Tolerated treatment well. No C/O verbalized. Post BP 141/82 and Pulse 77. Pt. D/C home walking in stable condition.

## 2016-01-06 NOTE — Progress Notes (Signed)
Pre visit review using our clinic review tool, if applicable. No additional management support is needed unless otherwise documented below in the visit note. 

## 2016-01-06 NOTE — Patient Instructions (Signed)
Please begin doxycycline for the boil on your back. Apply warm compresses twice daily to facilitate drainage. Call if increased pain, redness, swelling or if you develop fever.

## 2016-01-06 NOTE — Progress Notes (Signed)
Subjective:    Patient ID: PRIEST GARG, male    DOB: Aug 04, 1940, 75 y.o.   MRN: [id number]  HPI  Mr. Oliva is a 75 yr old male who presents today to discuss a "boil" on his upper back.  Pt reports that boil has been present since Sunday 01/04/16.  He reports that he has had an issue with a boil in the same place "some time back."  Reports that the area is very tender.   Review of Systems See HPI  Past Medical History:  Diagnosis Date  . ANXIETY   . Bladder stone   . BPH (benign prostatic hypertrophy)   . Carotid artery occlusion   . 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 CVD (cerebrovascular disease)   . Eczema   . GERD (gastroesophageal reflux disease)   . History of carotid artery stenosis    S/P BILATERAL CEA  . 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 Hyperlipidemia   . Nocturia   . S/P carotid endarterectomy    BILATERAL ICA--  PATENT PER DUPLEX  05-19-2012  . Stroke [facility]) 1996   left side weakness  . Urinary frequency   . Vitamin D deficiency      Social History   Social History  . Marital status: Married    Spouse name: N/A  . Number of children: 2  . Years of education: N/A   Occupational History  .  Retired   Social History Main Topics  . Smoking status: Former Smoker    Packs/day: 2.00    Years: 40.00    Types: Cigarettes    Quit date: 02/15/2005  . Smokeless tobacco: Never Used  . Alcohol use 0.0 oz/week     Comment: Occasional  . Drug use: No  . Sexual activity: Yes    Partners: Female   Other Topics Concern  . Not on file   Social History Narrative   Exercise--  Walks dogs everyday    Past Surgical History:  Procedure Laterality Date  . APPENDECTOMY  AS CHILD  . CARDIOVASCULAR STRESS TEST  03-27-2012  DR CRENSHAW   LOW RISK LEXISCAN STUDY-- PROBABLE NORMAL PERFUSION AND SOFT TISSUE ATTENUATION/  NO ISCHEMIA/ EF 51%  . CAROTID ENDARTERECTOMY Bilateral LEFT  11-12-2008  DR GREG HAYES   RIGHT ICA  2006  ([facility])  . CYSTOSCOPY WITH LITHOLAPAXY N/A 02/26/2013   Procedure: CYSTOSCOPY WITH LITHOLAPAXY;  Surgeon: Franchot Gallo, MD;  Location: [facility];  Service: Urology;  Laterality: N/A;  . EYE SURGERY  Jan. 2016   cataract surgery both eyes  . INGUINAL HERNIA REPAIR Right 11-08-2006  . 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 PROSTATE SURGERY    . TRANSURETHRAL RESECTION OF PROSTATE N/A 02/26/2013   Procedure: TRANSURETHRAL RESECTION OF THE PROSTATE WITH GYRUS INSTRUMENTS;  Surgeon: Franchot Gallo, MD;  Location: [facility];  Service: Urology;  Laterality: N/A;    Family History  Problem Relation Age of Onset  . Heart disease Mother     CHF  . Bipolar disorder Mother   . Heart disease Father     CHF    Allergies  Allergen Reactions  . Adhesive [Tape] Other (See Comments)    BLISTER  . Strawberry (Diagnostic) Hives    Current Outpatient Prescriptions on File Prior to Visit  Medication Sig Dispense Refill  . aspirin 325 MG EC tablet Take 325  mg by mouth daily.    . B Complex Vitamins (VITAMIN B COMPLEX PO) Take by mouth daily.      . Calcium Carb-Cholecalciferol (CALCIUM 1000 + D PO) Take by mouth daily.      . carbamazepine (TEGRETOL-XR) 100 MG 12 hr tablet 1 qam  1  qhs 60 tablet 8  . Cholecalciferol (VITAMIN D) 2000 UNITS CAPS Take by mouth.    . Eszopiclone 3 MG TABS TAKE 1 TABLET BY MOUTH AT BEDTIME AS NEEDED TAKE IMMEDIATELY BEFORE BEDTIME 30 tablet 1  . fenofibrate 160 MG tablet Take 1 tablet (160 mg total) by mouth daily. REPEAT LABS ARE DUE NOW 90 tablet 0  . Flaxseed, Linseed, (FLAX SEED OIL PO) Take by mouth daily.      [name] lisinopril (PRINIVIL,ZESTRIL) 10 MG tablet TAKE 1 TABLET (10 MG TOTAL) BY MOUTH DAILY. 30 tablet 5  . LORazepam (ATIVAN) 0.5 MG tablet 2 bid  1  qday  prn 150 tablet 5  . Multiple Vitamin (MULTIVITAMIN) tablet Take 1 tablet by mouth daily.    [name] NEXIUM 40 MG capsule TAKE 1 CAPSULE BY  MOUTH AS NEEDED 90 capsule 0  . rosuvastatin (CRESTOR) 40 MG tablet Take 1 tablet (40 mg total) by mouth daily. 90 tablet 3  . tamsulosin (FLOMAX) 0.4 MG CAPS capsule 1 po qd 30 capsule   . traMADol (ULTRAM) 50 MG tablet Take 1 tablet (50 mg total) by mouth every 8 (eight) hours as needed. 20 tablet 0  . zolpidem (AMBIEN) 10 MG tablet Take 1 tablet (10 mg total) by mouth at bedtime as needed for sleep. 30 tablet 5   No current facility-administered medications on file prior to visit.     BP (!) 142/84   Pulse (!) 102   Temp 98.4 F (36.9 C) (Oral)   Ht 5\' 11"  (1.803 m)   SpO2 98% Comment: room air      Objective:   Physical Exam  Constitutional: He is oriented to person, place, and time. He appears well-developed and well-nourished.  Neurological: He is alert and oriented to person, place, and time.  Skin: Skin is warm and dry.     Approximately 1 inch wide firm abscess noted on mid upper back. Tender, some purulence noted  Psychiatric: He has a normal mood and affect. His behavior is normal. Judgment and thought content normal.          Assessment & Plan:  Skin Abscess-Procedure including risks/benefits explained to patient.  Questions were answered. After informed consent was obtained and a time out completed, site was cleansed with betadine and then alcohol. Abscess was infiltrated with 1% Lidocaine with epinephrine and incision/drainage was performed. Abscess appeared complex. Purulence as well as bloody drainage was expressed from the abscess. Some induration remained after I and D. Pt tolerated procedure well. Explained to patient and wife that patient will ultimately benefit from removal of larger cyst when acute infection is resolved. He sees Dr. Nevada Crane Dermatology and wife will make an appointment for him to see Dr. Nevada Crane.  In the meantime, pt is advised to begin doxycycline bid, apply warm compresses bid to facilitate drainage and to follow up in 1 week. Pt verbalizes  understanding.

## 2016-01-07 MED FILL — NexIUM 40 MG CPDR: 40 | 90 days supply | Qty: 90 | Fill #0

## 2016-01-08 ENCOUNTER — Other Ambulatory Visit (INDEPENDENT_AMBULATORY_CARE_PROVIDER_SITE_OTHER): Payer: PPO

## 2016-01-08 DIAGNOSIS — E785 Hyperlipidemia, unspecified: Secondary | ICD-10-CM

## 2016-01-08 LAB — COMPREHENSIVE METABOLIC PANEL
ALBUMIN: 4 g/dL (ref 3.5–5.2)
ALT: 26 U/L (ref 0–53)
AST: 24 U/L (ref 0–37)
Alkaline Phosphatase: 64 U/L (ref 39–117)
BUN: 24 mg/dL — AB (ref 6–23)
CALCIUM: 9.8 mg/dL (ref 8.4–10.5)
CHLORIDE: 104 meq/L (ref 96–112)
CO2: 27 meq/L (ref 19–32)
CREATININE: 1.38 mg/dL (ref 0.40–1.50)
GFR: 53.62 mL/min — ABNORMAL LOW (ref >60.00)
Glucose, Bld: 93 mg/dL (ref 70–99)
POTASSIUM: 3.8 meq/L (ref 3.5–5.1)
SODIUM: 139 meq/L (ref 135–145)
Total Bilirubin: 0.5 mg/dL (ref 0.2–1.2)
Total Protein: 7.5 g/dL (ref 6.0–8.3)

## 2016-01-08 LAB — LIPID PANEL
CHOL/HDL RATIO: 3
CHOLESTEROL: 134 mg/dL (ref 0–200)
HDL: 47.2 mg/dL (ref >39.00)
LDL CALC: 68 mg/dL (ref 0–99)
NonHDL: 86.92
Triglycerides: 93 mg/dL (ref 0.0–149.0)
VLDL: 18.6 mg/dL (ref 0.0–40.0)

## 2016-01-08 MED FILL — LISINOPRIL 10 MG TABLET: 10 | 30 days supply | Qty: 30 | Fill #3

## 2016-01-09 ENCOUNTER — Ambulatory Visit: Primary: Internal Medicine

## 2016-01-13 ENCOUNTER — Encounter: Payer: Self-pay | Admitting: Family Medicine

## 2016-01-13 ENCOUNTER — Ambulatory Visit (INDEPENDENT_AMBULATORY_CARE_PROVIDER_SITE_OTHER): Payer: PPO | Admitting: Family Medicine

## 2016-01-13 VITALS — BP 144/83 | HR 70 | Temp 98.3°F | Ht 71.0 in | Wt 232.6 lb

## 2016-01-13 DIAGNOSIS — L02219 Cutaneous abscess of trunk, unspecified: Secondary | ICD-10-CM | POA: Diagnosis not present

## 2016-01-13 DIAGNOSIS — G894 Chronic pain syndrome: Secondary | ICD-10-CM | POA: Diagnosis not present

## 2016-01-13 DIAGNOSIS — I10 Essential (primary) hypertension: Secondary | ICD-10-CM

## 2016-01-13 DIAGNOSIS — L03319 Cellulitis of trunk, unspecified: Secondary | ICD-10-CM

## 2016-01-13 MED ORDER — LISINOPRIL 20 MG PO TABS: 20.0000 mg | ORAL_TABLET | Freq: Every day | ORAL | 3 refills | Status: DC

## 2016-01-13 MED ORDER — TRAMADOL HCL 50 MG PO TABS: 50.0000 mg | ORAL_TABLET | Freq: Three times a day (TID) | ORAL | 0 refills | Status: DC | PRN

## 2016-01-13 MED ORDER — CEPHALEXIN 500 MG PO CAPS: 500.0000 mg | ORAL_CAPSULE | Freq: Four times a day (QID) | ORAL | 0 refills | Status: DC

## 2016-01-13 MED FILL — traMADol HCL 50 MG TABS: 50 | 6 days supply | Qty: 20 | Fill #0

## 2016-01-13 MED FILL — CEPHALEXIN 500 MG CAPSULE: 500 | 10 days supply | Qty: 40 | Fill #0

## 2016-01-13 MED FILL — LISINOPRIL 20 MG TABLET: 20 | 90 days supply | Qty: 90 | Fill #0

## 2016-01-13 NOTE — Progress Notes (Signed)
Patient ID: William [name], male    DOB: 1940-10-07  Age: 75 y.o. MRN: [id number]    Subjective:  Subjective  HPI William [name] presents for abscess on back -- he had a I and D last week and was put on doxycycline=--- it has not improved and actually looks more red per pt wife.  It is very tender  Review of Systems  Constitutional: Negative for fatigue and fever.  Skin: Positive for wound.    History Past Medical History:  Diagnosis Date  . ANXIETY   . Bladder stone   . BPH (benign prostatic hypertrophy)   . Carotid artery occlusion   . 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 CVD (cerebrovascular disease)   . Eczema   . GERD (gastroesophageal reflux disease)   . History of carotid artery stenosis    S/P BILATERAL CEA  . 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 Hyperlipidemia   . Nocturia   . S/P carotid endarterectomy    BILATERAL ICA--  PATENT PER DUPLEX  05-19-2012  . Stroke [facility]) 1996   left side weakness  . Urinary frequency   . Vitamin D deficiency     He has a past surgical history that includes Carotid endarterectomy (Bilateral, LEFT  11-12-2008  DR William [name]); Inguinal hernia repair (Right, 11-08-2006); Cardiovascular stress test (03-27-2012  DR CRENSHAW); Appendectomy (AS CHILD); Cystoscopy with litholapaxy (N/A, 02/26/2013); Transurethral resection of prostate (N/A, 02/26/2013); ostate surgery; Mohs surgery (Left, 1/ 2016); and Eye surgery (Jan. 2016).   His family history includes Bipolar disorder in his mother; Heart disease in his father and mother.He reports that he quit smoking about 10 years ago. His smoking use included Cigarettes. He has a 80.00 pack-year smoking history. He has never used smokeless tobacco. He reports that he drinks alcohol. He reports that he does not use drugs.  Current Outpatient Prescriptions on File Prior to Visit  Medication Sig Dispense Refill  . aspirin 325 MG EC tablet Take 325 mg by  mouth daily.    . B Complex Vitamins (VITAMIN B COMPLEX PO) Take by mouth daily.      . Calcium Carb-Cholecalciferol (CALCIUM 1000 + D PO) Take by mouth daily.      . carbamazepine (TEGRETOL-XR) 100 MG 12 hr tablet 1 qam  1  qhs 60 tablet 8  . Cholecalciferol (VITAMIN D) 2000 UNITS CAPS Take by mouth.    . doxycycline (VIBRA-TABS) 100 MG tablet Take 1 tablet (100 mg total) by mouth 2 (two) times daily. 14 tablet 0  . Eszopiclone 3 MG TABS TAKE 1 TABLET BY MOUTH AT BEDTIME AS NEEDED TAKE IMMEDIATELY BEFORE BEDTIME 30 tablet 1  . fenofibrate 160 MG tablet Take 1 tablet (160 mg total) by mouth daily. REPEAT LABS ARE DUE NOW 90 tablet 0  . Flaxseed, Linseed, (FLAX SEED OIL PO) Take by mouth daily.      [name] lisinopril (PRINIVIL,ZESTRIL) 10 MG tablet TAKE 1 TABLET (10 MG TOTAL) BY MOUTH DAILY. 30 tablet 5  . LORazepam (ATIVAN) 0.5 MG tablet 2 bid  1  qday  prn 150 tablet 5  . Multiple Vitamin (MULTIVITAMIN) tablet Take 1 tablet by mouth daily.    [name] NEXIUM 40 MG capsule TAKE 1 CAPSULE BY MOUTH AS NEEDED 90 capsule 0  . rosuvastatin (CRESTOR) 40 MG tablet Take 1 tablet (40 mg total) by mouth daily. 90 tablet 3  . tamsulosin (FLOMAX) 0.4 MG CAPS  capsule 1 po qd 30 capsule   . zolpidem (AMBIEN) 10 MG tablet Take 1 tablet (10 mg total) by mouth at bedtime as needed for sleep. 30 tablet 5   No current facility-administered medications on file prior to visit.      Objective:  Objective  Physical Exam  Constitutional: He appears well-developed and well-nourished.  Skin:     Nursing note and vitals reviewed.  BP (!) 144/83 (BP Location: Left Arm, Patient Position: Sitting, Cuff Size: Normal)   Pulse 70   Temp 98.3 F (36.8 C) (Oral)   Ht 5\' 11"  (1.803 m)   Wt 232 lb 9.6 oz (105.5 kg)   SpO2 98%   BMI 32.44 kg/m  Wt Readings from Last 3 Encounters:  01/13/16 232 lb 9.6 oz (105.5 kg)  11/04/15 236 lb 12.8 oz (107.4 kg)  07/17/15 236 lb 3.2 oz (107.1 kg)     Lab Results  Component Value  Date   WBC 8.5 07/17/2015   HGB 13.6 07/17/2015   HCT 41.7 07/17/2015   PLT 294.0 07/17/2015   GLUCOSE 93 01/08/2016   CHOL 134 01/08/2016   TRIG 93.0 01/08/2016   HDL 47.20 01/08/2016   LDLCALC 68 01/08/2016   ALT 26 01/08/2016   AST 24 01/08/2016   NA 139 01/08/2016   K 3.8 01/08/2016   CL 104 01/08/2016   CREATININE 1.38 01/08/2016   BUN 24 (H) 01/08/2016   CO2 27 01/08/2016   TSH 1.668 10/16/2013   PSA 2.14 07/17/2015   INR 1.0 11/08/2008   HGBA1C 6.1 06/27/2014    No results found.   Assessment & Plan:  Plan  I am having William [name] start on cephALEXin. I am also having him maintain his (Flaxseed, Linseed, (FLAX SEED OIL PO)), B Complex Vitamins (VITAMIN B COMPLEX PO), Calcium Carb-Cholecalciferol (CALCIUM 1000 + D PO), aspirin, multivitamin, Vitamin D, zolpidem, lisinopril, tamsulosin, rosuvastatin, LORazepam, carbamazepine, Eszopiclone, fenofibrate, NEXIUM, doxycycline, and traMADol.  Meds ordered this encounter  Medications  . traMADol (ULTRAM) 50 MG tablet    Sig: Take 1 tablet (50 mg total) by mouth every 8 (eight) hours as needed.    Dispense:  20 tablet    Refill:  0  . cephALEXin (KEFLEX) 500 MG capsule    Sig: Take 1 capsule (500 mg total) by mouth 4 (four) times daily.    Dispense:  40 capsule    Refill:  0    Problem List Items Addressed This Visit    None    Visit Diagnoses    Cellulitis and abscess of trunk    -  Primary   Relevant Medications   cephALEXin (KEFLEX) 500 MG capsule   Other Relevant Orders   Wound culture   Chronic pain syndrome       Relevant Medications   traMADol (ULTRAM) 50 MG tablet     Pt will take new rx abx per orders and f/u derm for removal ---- they may need surgical referral  Follow-up: Return as scheduled .  William Held, DO

## 2016-01-13 NOTE — Patient Instructions (Signed)

## 2016-01-16 ENCOUNTER — Other Ambulatory Visit: Payer: Self-pay | Admitting: Family Medicine

## 2016-01-16 ENCOUNTER — Encounter: Payer: Self-pay | Admitting: Family Medicine

## 2016-01-16 DIAGNOSIS — L02212 Cutaneous abscess of back [any part, except buttock]: Secondary | ICD-10-CM

## 2016-01-17 LAB — WOUND CULTURE
GRAM STAIN: NONE SEEN
GRAM STAIN: NONE SEEN
Gram Stain: NONE SEEN
Organism ID, Bacteria: NO GROWTH

## 2016-01-19 DIAGNOSIS — L723 Sebaceous cyst: Secondary | ICD-10-CM | POA: Diagnosis not present

## 2016-01-19 DIAGNOSIS — L089 Local infection of the skin and subcutaneous tissue, unspecified: Secondary | ICD-10-CM | POA: Diagnosis not present

## 2016-01-19 MED FILL — LORazepam 0.5 MG TABS: 0.5 | 30 days supply | Qty: 150 | Fill #1

## 2016-01-19 MED FILL — ROSUVASTATIN CALCIUM 40 MG: 40 | 90 days supply | Qty: 90 | Fill #2

## 2016-01-19 MED FILL — ZOLPIDEM TARTRATE 10 MG TAB: 10 | 30 days supply | Qty: 30 | Fill #1

## 2016-01-30 ENCOUNTER — Inpatient Hospital Stay: Admit: 2016-01-30 | Discharge: 2016-01-31 | Primary: Internal Medicine

## 2016-01-30 DIAGNOSIS — N184 Chronic kidney disease, stage 4 (severe): Secondary | ICD-10-CM

## 2016-01-30 DIAGNOSIS — D500 Iron deficiency anemia secondary to blood loss (chronic): Secondary | ICD-10-CM

## 2016-01-30 DIAGNOSIS — D631 Anemia in chronic kidney disease: Secondary | ICD-10-CM

## 2016-02-03 ENCOUNTER — Ambulatory Visit: Admit: 2016-02-03 | Discharge: 2016-02-04 | Attending: Hematology & Oncology | Primary: Internal Medicine

## 2016-02-03 ENCOUNTER — Inpatient Hospital Stay: Admit: 2016-02-03 | Discharge: 2016-02-04 | Primary: Internal Medicine

## 2016-02-03 DIAGNOSIS — I509 Heart failure, unspecified: Secondary | ICD-10-CM

## 2016-02-03 DIAGNOSIS — Z79891 Long term (current) use of opiate analgesic: Secondary | ICD-10-CM

## 2016-02-03 DIAGNOSIS — I130 Hypertensive heart and chronic kidney disease with heart failure and stage 1 through stage 4 chronic kidney disease, or unspecified chronic kidney disease: Secondary | ICD-10-CM

## 2016-02-03 DIAGNOSIS — E785 Hyperlipidemia, unspecified: Secondary | ICD-10-CM

## 2016-02-03 DIAGNOSIS — I2510 Atherosclerotic heart disease of native coronary artery without angina pectoris: Secondary | ICD-10-CM

## 2016-02-03 DIAGNOSIS — E1122 Type 2 diabetes mellitus with diabetic chronic kidney disease: Secondary | ICD-10-CM

## 2016-02-03 DIAGNOSIS — I10 Essential (primary) hypertension: Secondary | ICD-10-CM

## 2016-02-03 DIAGNOSIS — D631 Anemia in chronic kidney disease: Secondary | ICD-10-CM

## 2016-02-03 DIAGNOSIS — Z79899 Other long term (current) drug therapy: Secondary | ICD-10-CM

## 2016-02-03 DIAGNOSIS — N184 Chronic kidney disease, stage 4 (severe): Principal | ICD-10-CM

## 2016-02-03 DIAGNOSIS — D125 Benign neoplasm of sigmoid colon: Secondary | ICD-10-CM

## 2016-02-03 DIAGNOSIS — I4891 Unspecified atrial fibrillation: Secondary | ICD-10-CM

## 2016-02-03 DIAGNOSIS — E119 Type 2 diabetes mellitus without complications: Secondary | ICD-10-CM

## 2016-02-03 DIAGNOSIS — Z7982 Long term (current) use of aspirin: Secondary | ICD-10-CM

## 2016-02-03 DIAGNOSIS — I6350 Cerebral infarction due to unspecified occlusion or stenosis of unspecified cerebral artery: Secondary | ICD-10-CM

## 2016-02-03 DIAGNOSIS — I639 Cerebral infarction, unspecified: Secondary | ICD-10-CM

## 2016-02-03 DIAGNOSIS — K5730 Diverticulosis of large intestine without perforation or abscess without bleeding: Secondary | ICD-10-CM

## 2016-02-03 DIAGNOSIS — F1010 Alcohol abuse, uncomplicated: Secondary | ICD-10-CM

## 2016-02-03 DIAGNOSIS — Z955 Presence of coronary angioplasty implant and graft: Secondary | ICD-10-CM

## 2016-02-03 MED ORDER — EPOETIN ALFA 20000 UNIT/ML IJ SOLN
20000 [IU] | Freq: Once | SUBCUTANEOUS | Status: CN
Start: 2016-02-03 — End: ?

## 2016-02-03 MED ORDER — EPOETIN ALFA 20000 UNIT/ML IJ SOLN
20000 [IU] | Freq: Once | SUBCUTANEOUS | Status: CP
Start: 2016-02-03 — End: ?

## 2016-02-03 NOTE — Progress Notes
General: Well developed, well nourished, No acute Distress, Gentleman patient and Elderly appearance  Head:Atraumatic and normocephalic  Eyes:PERRLA, EOMS intact, No jaundice, pallor and Conjunctiva clear  Ears, Nose, Throat, Neck, and Mouth: Trachea Midline, No JVD, No Lymphadenopathy, Thyroid midline-normal, Neck supple and No oral lesions  Cardiovascular:S1,S2 no murmurs and Regular heart beat  Chest:Symmetrical, No kyphosis and No scoliosis  Respiratory:Clear, No rales/Rhonchi  Gastrointestinal:Abdomen soft, Abdomen non-tender, Abdomen non-distended, Abdomen without masses, No ascites and No hepatosplenomegaly  Hematologic/Lymphatic:No petechiae, No purpura, No neck lymphadenopathy, No Axillary lymphadenopathy and No Groin lymphadenopathy  Musculoskeletal: ambulates with the assistance of a cane.  Extremities:+1 edema present, No cyanosis, No digital clubbing and No discoloration, RLE wrapped  Neurologic:Alert and oriented, No Hemiparesis and No motor deficits    Assessment:  Jeffery Gonzales is a 75 year old male who was referred to the hematology office for an evaluation of anemia. This was most consistent with iron deficiency anemia. The patient completed a GI evaluation which did not reveal a specific source of bleeding. However, I suspect that the patient likely has had chronic GI blood loss resulting in the anemia. The patient has a history of prior GI blood loss which was exacerbated by Xarelto. The patient was on anticoagulation due to the congestive heart failure and atrial fibrillation however this has since been discontinued. He is currently only on aspirin therapy alone.  ??  The patient denies any overt bleeding issues. There were no findings of B12 deficiency, folate deficiency, or hemolysis. The bone marrow biopsy did not show evidence of leukemia, lymphoma, myelodysplasia, or myelofibrosis. The changes noted in the bone marrow were felt consistent

## 2016-02-03 NOTE — Patient Instructions
Pt to have Procrit today  Pt to have f/u exam and cbc in 6 weeks.

## 2016-02-03 NOTE — Progress Notes
Patient Name: Jeffery Gonzales    Date: 02/03/2016    Date Of Birth: 1940-10-08    History of Present Illness:  Jeffery Gonzales is a 43?year old Caucasian male who was referred to the hematology office for an evaluation of anemia. The patient has undergone an evaluation by Dr. Carolina Cellar at [facility] due to iron deficiency anemia. The patient has a history of a small bowel GI bleed in 2013 and a second episode in February 2015 most recently occurring in the setting of Xarelto use. The patient has required several transfusions in the past. The patient denies any recent evidence of bleeding specifically no bright red blood per rectum or black stools. He underwent a colonoscopy exam 10/17/2013 which revealed a 3 mm sessile polyp in the descending colon which was removed, a 3 mm sessile polyp in the descending colon which was removed, a diminutive sessile polyp in the sigmoid colon which was removed, severe diverticulosis and otherwise a normal exam. An upper endoscopy obtained 10/17/2013 revealed patchy erythema in the antrum of the stomach but otherwise a normal exam. A capsule endoscopy obtained 10/18/2013 revealed a small erythematous spot in the proximal-mi jejunum without stigmata for bleeding. The remainder of the small bowel exam was normal. As there were no overt bleeding sites identified on the GI evaluation the patient was referred to the hematology office for additional assessment of the anemia and to evaluate for a possible myelodysplasia.  ??  The patient has a significant history of coronary artery disease, diabetes, congestive heart failure, hypertension, hyperlipidemia. He states that he fell and required hip surgery in March 2015.  ??  The patient received a dose of Injectafer 06/18/2014 but did not return the following week to receive cycle 2. The patient states that he had been suffering from persistent pain in the hip and is being evaluated by

## 2016-02-03 NOTE — Progress Notes
?    folic acid 1 MG Tablet, , Disp: , Rfl:   ?  furosemide (LASIX) 40 MG tablet, Take 1 Tablet by mouth 2 times daily., Disp: 30 Tablet, Rfl: 12  ?  lisinopril (PRINIVIL,ZESTRIL) 2.5 MG tablet, Take 1 Tablet by mouth daily., Disp: 30 Tablet, Rfl: 12  ?  oxyCODONE-acetaminophen (PERCOCET) 10-325 MG Tablet, Take 10-325 tablets by mouth daily., Disp: , Rfl:   ?  pantoprazole (PROTONIX) 40 MG tablet, Take 1 Tablet by mouth 2 times daily (before meals)., Disp: 60 Tablet, Rfl: 2  ?  STOOL SOFTENER LAXATIVE DC 240 MG Capsule, Take 240 mg by mouth daily., Disp: , Rfl:   ?  VOLTAREN 1 % Gel topical gel, Apply 1 % N20 topically daily., Disp: , Rfl:       Labs:   Hospital Outpatient Visit on 01/30/2016   Component Date Value   ? Ferritin 01/30/2016 146.2    ? Sodium 01/30/2016 137    ? Potassium 01/30/2016 5.7*   ? Chloride 01/30/2016 105    ? CO2 01/30/2016 19*   ? Urea Nitrogen 01/30/2016 35*   ? Creatinine 01/30/2016 1.17    ? BUN/Creatinine Ratio 01/30/2016 29.9*   ? Glucose 01/30/2016 132*   ? Calcium 01/30/2016 8.3*   ? Anion Gap 01/30/2016 13    ? EGFR 01/30/2016 >59    ? Iron 01/30/2016 71    ? Transferrin 01/30/2016 219    ? TIBC 01/30/2016 278    ? Iron Saturation 01/30/2016 26    ? WBC 01/30/2016 3.39*   ? RBC 01/30/2016 3.27    ? Hemoglobin 01/30/2016 9.3*   ? Hematocrit 01/30/2016 30.3*   ? MCV 01/30/2016 92.7    ? [facility] 01/30/2016 28.4    ? MCHC 01/30/2016 30.7    ? RDW 01/30/2016 20.2    ? Platelet Count 01/30/2016 77*   ? MPV 01/30/2016 8.8    ? Neutrophils Absolute 01/30/2016 2.34    ? Neutrophils % 01/30/2016 69.0    ? Lymphocytes Absolute 01/30/2016 0.48    ? Lymphocytes % 01/30/2016 14.2*   ? Monocytes Absolute 01/30/2016 0.41    ? Monocytes % 01/30/2016 12.1    ? Eosinophils Absolute 01/30/2016 0.16    ? Eosinophils % 01/30/2016 4.7    ? Basophil Absolute 01/30/2016 0.00    ? BASOPHILS 01/30/2016 0.0    Office Visit on 12/30/2015   Component Date Value   ? WBC 12/30/2015 4.61    ? RBC 12/30/2015 2.91

## 2016-02-03 NOTE — Progress Notes
Family History   Problem Relation Age of Onset   ? Diabetes Mother    ? Cancer Father        Vital Signs:   Vitals:    02/03/16 1153   BP: 146/56   Pulse: 99   Resp: 18   Temp: 36.3 ?C (97.4 ?F)   Weight: 105.7 kg (233 lb)   Height: 1.778 m (5\' 10" )       Allergies: No known drug allergy    Review of Systems:  Constitutional:?Weight loss  Fatigue  No loss of appetite  No night sweats  No fever  No chills?  Weight loss due to diuresis  Eyes:?No blurred vision  No double vision  No difficulty seeing  Ears,Nose, Mouth,Throat: No hearing loss  No ringing in ears  No sinus trouble  No difficulty swallowing  No sore throat  No nasal drainage  No nose bleeds  No hoarseness  Cardiac:No heart papitations  No lightheadedness  Swelling in legs: much improved  Respiratory:No cough  No sputum production  No hemoptysis  No shortness of breath  No orthopnea  Gastrointestinal:No nausea  No vomiting  No heartburn  No constipation  No diarrhea  No abdominal pain  No rectal bleeding  No Bowel incontinence  Genitourinary:No burning on urination  No pain with urination  No blood in urine  No frequent urination  No urinary incontinence  Musculosketal:No muscle pain  No stiffness  No joint pain  No joint swelling  No back pain  Skin:No skin rash  Neurological:No seizures  No dizziness  No loss of balance  No weakness of limbs  No loss of sensation  Tingling sensations bilateral hands  No memory loss  No difficulty talking  Psychiatric:No nervousness  No depression  No restlessness  No difficulty sleeping  Hematologic/Lymphatic/Immunologic:No bleeding  No lumps in arm pits  No lumps in neck  No lumps in groin  ??  ?      Medications:   Current Outpatient Prescriptions:   ?  aspirin 325 MG Tablet Delayed Release, , Disp: , Rfl:   ?  atorvastatin (LIPITOR) 40 MG tablet, Take 1 Tablet by mouth nightly., Disp: 30 Tablet, Rfl: 12  ?  clopidogrel (PLAVIX) 75 MG tablet, Take 1 Tablet by mouth daily., Disp: 30 Tablet, Rfl: 12

## 2016-02-03 NOTE — Progress Notes
?   Hemoglobin 12/30/2015 7.5*   ? Hematocrit 12/30/2015 25.3*   ? MCV 12/30/2015 86.9    ? [facility] 12/30/2015 25.8    ? MCHC 12/30/2015 29.6    ? RDW 12/30/2015 15.6    ? Platelet Count 12/30/2015 124*   ? MPV 12/30/2015 9.5    ? Neutrophils Absolute 12/30/2015 3.13    ? Neutrophils % 12/30/2015 68.0    ? Lymphocytes Absolute 12/30/2015 0.67    ? Lymphocytes % 12/30/2015 14.5*   ? Monocytes Absolute 12/30/2015 0.60    ? Monocytes % 12/30/2015 13.0    ? Eosinophils Absolute 12/30/2015 0.19    ? Eosinophils % 12/30/2015 4.1    ? Basophil Absolute 12/30/2015 0.02    ? BASOPHILS 12/30/2015 0.4    Office Visit on 12/26/2015   Component Date Value   ? WBC 12/26/2015 4.26*   ? RBC 12/26/2015 2.70    ? Hemoglobin 12/26/2015 7.0*   ? Hematocrit 12/26/2015 23.8*   ? MCV 12/26/2015 88.1    ? [facility] 12/26/2015 25.9    ? MCHC 12/26/2015 29.4    ? RDW 12/26/2015 15.4    ? Platelet Count 12/26/2015 104*   ? MPV 12/26/2015 8.7    ? Neutrophils Absolute 12/26/2015 3.00    ? Neutrophils % 12/26/2015 70.5    ? Lymphocytes Absolute 12/26/2015 0.47    ? Lymphocytes % 12/26/2015 11.0*   ? Monocytes Absolute 12/26/2015 0.68    ? Monocytes % 12/26/2015 16.0    ? Eosinophils Absolute 12/26/2015 0.10    ? Eosinophils % 12/26/2015 2.3    ? Basophil Absolute 12/26/2015 0.01    ? BASOPHILS 12/26/2015 0.2    Hospital Outpatient Visit on 12/26/2015   Component Date Value   ? Vitamin B-12 12/26/2015 546    ? Sodium 12/26/2015 136    ? Potassium 12/26/2015     ? Chloride 12/26/2015 101    ? CO2 12/26/2015 24    ? Urea Nitrogen 12/26/2015 57*   ? Creatinine 12/26/2015 1.44*   ? BUN/Creatinine Ratio 12/26/2015 39.6*   ? Glucose 12/26/2015 87    ? Calcium 12/26/2015 8.4*   ? Total Protein 12/26/2015 7.4    ? Albumin 12/26/2015 4.3    ? Calc Total Globuin 12/26/2015 3.1    ? ALBUMIN/GLOBULIN RATIO 12/26/2015 1.4    ? Total Bilirubin 12/26/2015 0.4    ? Alkaline Phosphatase 12/26/2015 113    ? AST 12/26/2015 19    ? ALT 12/26/2015 16

## 2016-02-03 NOTE — Progress Notes
orthopedics and pain management. The patient received dose 2 of Injectafer in February 2016, July 2016, Septmeber 2016 and February 2017.   ??  The patient underwent a bone marrow biopsy and aspirate 04/28/15 to evaluate for possible MDS. This revealed a hyperplastic marrow (65% cellularity) with mild myeloid hyperplasia and erythroid hyperplasia. Flow cytometry showed no evidence of acute leukemia, B-cell lymphproliferative process or T-cell aberrancy. 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 FISH analysis: Normal results seen for 3 every week, 5, 7, 8, 11(MLL), 12(ETV6), 17, 19, 20. This was interpreted as a normal result. In the absence of a cytogenetic/fish abnormality, the changes are likely reactive. Clinical correlation and followup are recommended.  ??  The patient received a transfusion in November 2016 and has received several doses of intravenous iron. The patient still has symptoms of fatigue but states that overall he is improved. The patient denies symptoms of bright red blood per rectum or black stools.  ??  The patient began Procrit 20,000 unit sq every 2 weeks. The Procrit was held due to progressive iron deficiency anemia. The patient completed a course of parenteral iron infusions in July 2017.  The patient has not noticed any significant change in his energy level.  Past Surgical History:   Past Surgical History:   Procedure Laterality Date   ? APPENDECTOMY     ? CORONARY ANGIOPLASTY WITH STENT PLACEMENT      x 2       Social History:    Social History     Social History   ? Marital status: Divorced     Spouse name: N/A   ? Number of children: N/A   ? Years of education: N/A     Occupational History   ? Not on file.     Social History Main Topics   ? Smoking status: Never Smoker   ? Smokeless tobacco: Not on file   ? Alcohol use 216.0 oz/week     18 Cans of beer per week   ? Drug use: No   ? Sexual activity: No     Other Topics Concern   ? Not on file     Social History Narrative       Family History:

## 2016-02-03 NOTE — Progress Notes
with a reactive process. 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  The patient completed a course of parenteral iron infusions with a significant improvement in the hematologic counts and iron indices.  I have again strongly encourage the patient to return to his gastroenterologist for an evaluation. The patient states that [facility] and will need to call and schedule an appointment. I have discussed with the patient the importance of undergoing an evaluation for GI bleeding sources including the possibility of malignancy.  The patient verbalized an understanding of our discussion and states that he will make the appropriate arrangements.  ?    Recommendation/Plan:  1. We will resume Procrit 20,000 units subcutaneous every 2 weeks.  2. The patient was strongly encouraged to return to his?gastroenterologist?for an evaluation.  3. The patient will return for an exam and CBC in 6 weeks.  4. As always, Jeffery Gonzales understands that he may return to the office sooner than the scheduled appointment if any questions or concerns arise during the interim.    Return to Clinic: Return in about 6 weeks (around 03/16/2016).

## 2016-02-03 NOTE — Progress Notes
?   Anion Gap 12/26/2015 11    ? EGFR 12/26/2015 48    ? Ferritin 12/26/2015 21.0*   ? Folate 12/26/2015 >20.0*   ? Iron 12/26/2015 23*   ? Transferrin 12/26/2015 321    ? TIBC 12/26/2015 408*   ? Iron Saturation 12/26/2015 6*   ? LD Total 12/26/2015 183    ? Retic Ct Pct 12/26/2015 1.0    ? Absolute Reticulocyte Co* 12/26/2015 0.0264    ? Reticulated Hemoglobin 12/26/2015 20.8[facility] Outpatient Visit on 12/12/2015   Component Date Value   ? WBC 12/12/2015 3.67*   ? RBC 12/12/2015 2.64    ? Hemoglobin 12/12/2015 7.2*   ? Hematocrit 12/12/2015 23.8*   ? MCV 12/12/2015 90.2    ? [place] 12/12/2015 27.3    ? MCHC 12/12/2015 30.3    ? RDW 12/12/2015 14.9    ? Platelet Count 12/12/2015 92*   ? MPV 12/12/2015 8.8    ? Neutrophils Absolute 12/12/2015 2.12    ? Neutrophils % 12/12/2015 57.8    ? Lymphocytes Absolute 12/12/2015 0.66    ? Lymphocytes % 12/12/2015 18.0*   ? Monocytes Absolute 12/12/2015 0.57    ? Monocytes % 12/12/2015 15.5    ? Eosinophils Absolute 12/12/2015 0.31    ? Eosinophils % 12/12/2015 8.4    ? Basophil Absolute 12/12/2015 0.01    ? BASOPHILS 12/12/2015 0.3    ? WBC 12/19/2015 4.06*   ? RBC 12/19/2015 2.74    ? Hemoglobin 12/19/2015 7.4*   ? Hematocrit 12/19/2015 25.0*   ? MCV 12/19/2015 91.2    ? [place] 12/19/2015 27.0    ? MCHC 12/19/2015 29.6    ? RDW 12/19/2015 15.0    ? Platelet Count 12/19/2015 100*   ? MPV 12/19/2015 9.2    ? Neutrophils Absolute 12/19/2015 3.16    ? Neutrophils % 12/19/2015 77.9    ? Lymphocytes Absolute 12/19/2015 0.41    ? Lymphocytes % 12/19/2015 10.1*   ? Monocytes Absolute 12/19/2015 0.35    ? Monocytes % 12/19/2015 8.6    ? Eosinophils Absolute 12/19/2015 0.13    ? Eosinophils % 12/19/2015 3.2    ? Basophil Absolute 12/19/2015 0.01    ? BASOPHILS 12/19/2015 0.2          Imaging Results:     Health Maintenance: Colonoscopy. Last done:2015. TRANSFUSION HISTORY:Patient has received blood products in the past.    Physical Exam:

## 2016-02-05 ENCOUNTER — Ambulatory Visit (INDEPENDENT_AMBULATORY_CARE_PROVIDER_SITE_OTHER): Payer: PPO | Admitting: Family Medicine

## 2016-02-05 ENCOUNTER — Encounter: Payer: Self-pay | Admitting: Family Medicine

## 2016-02-05 VITALS — BP 110/78 | HR 67 | Temp 98.0°F | Wt 230.8 lb

## 2016-02-05 DIAGNOSIS — J028 Acute pharyngitis due to other specified organisms: Secondary | ICD-10-CM

## 2016-02-05 DIAGNOSIS — J020 Streptococcal pharyngitis: Secondary | ICD-10-CM

## 2016-02-05 LAB — POCT RAPID STREP A (OFFICE): Rapid Strep A Screen: NEGATIVE

## 2016-02-05 MED ORDER — LEVOCETIRIZINE DIHYDROCHLORIDE 5 MG PO TABS: 5.0000 mg | ORAL_TABLET | Freq: Every evening | ORAL | 5 refills | Status: DC

## 2016-02-05 MED ORDER — AMOXICILLIN 875 MG PO TABS: 875.0000 mg | ORAL_TABLET | Freq: Two times a day (BID) | ORAL | 0 refills | Status: DC

## 2016-02-05 MED FILL — LEVOCETIRIZINE 5 MG TABLET: 5 | 30 days supply | Qty: 30 | Fill #0

## 2016-02-05 MED FILL — TAMSULOSIN HCL 0.4 MG CAP: 0.4 | 90 days supply | Qty: 90 | Fill #2

## 2016-02-05 MED FILL — TEGRETOL XR 100 MG TABLET: 100 | 30 days supply | Qty: 60 | Fill #8

## 2016-02-05 MED FILL — AMOXICILLIN 875 MG TABLET: 875 | 10 days supply | Qty: 20 | Fill #0

## 2016-02-05 NOTE — Progress Notes (Signed)
Patient ID: William [name], male    DOB: 1940/07/31  Age: 75 y.o. MRN: [id number]    Subjective:  Subjective  HPI IVANN GUADAGNOLI presents for sore throat since Sunday and tired.    No congestion   Review of Systems  Constitutional: Negative for chills and fever.  HENT: Positive for sore throat. Negative for congestion, postnasal drip, rhinorrhea and sinus pressure.   Respiratory: Negative for cough, chest tightness, shortness of breath and wheezing.   Cardiovascular: Negative for chest pain, palpitations and leg swelling.  Allergic/Immunologic: Negative for environmental allergies.    History Past Medical History:  Diagnosis Date  . ANXIETY   . Bladder stone   . BPH (benign prostatic hypertrophy)   . Carotid artery occlusion   . 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 CVD (cerebrovascular disease)   . Eczema   . GERD (gastroesophageal reflux disease)   . History of carotid artery stenosis    S/P BILATERAL CEA  . 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 Hyperlipidemia   . Nocturia   . S/P carotid endarterectomy    BILATERAL ICA--  PATENT PER DUPLEX  05-19-2012  . Stroke [facility]) 1996   left side weakness  . Urinary frequency   . Vitamin D deficiency     He has a past surgical history that includes Carotid endarterectomy (Bilateral, LEFT  11-12-2008  DR GREG HAYES); Inguinal hernia repair (Right, 11-08-2006); Cardiovascular stress test (03-27-2012  DR CRENSHAW); Appendectomy (AS CHILD); Cystoscopy with litholapaxy (N/A, 02/26/2013); Transurethral resection of prostate (N/A, 02/26/2013); ostate surgery; Mohs surgery (Left, 1/ 2016); and Eye surgery (Jan. 2016).   His family history includes Bipolar disorder in his mother; Heart disease in his father and mother.He reports that he quit smoking about 10 years ago. His smoking use included Cigarettes. He has a 80.00 pack-year smoking history. He has never used smokeless tobacco. He reports that  he drinks alcohol. He reports that he does not use drugs.  Current Outpatient Prescriptions on File Prior to Visit  Medication Sig Dispense Refill  . aspirin 325 MG EC tablet Take 325 mg by mouth daily.    . B Complex Vitamins (VITAMIN B COMPLEX PO) Take by mouth daily.      . Calcium Carb-Cholecalciferol (CALCIUM 1000 + D PO) Take by mouth daily.      . carbamazepine (TEGRETOL-XR) 100 MG 12 hr tablet 1 qam  1  qhs 60 tablet 8  . Cholecalciferol (VITAMIN D) 2000 UNITS CAPS Take by mouth.    . doxycycline (VIBRA-TABS) 100 MG tablet Take 1 tablet (100 mg total) by mouth 2 (two) times daily. 14 tablet 0  . Eszopiclone 3 MG TABS TAKE 1 TABLET BY MOUTH AT BEDTIME AS NEEDED TAKE IMMEDIATELY BEFORE BEDTIME 30 tablet 1  . fenofibrate 160 MG tablet Take 1 tablet (160 mg total) by mouth daily. REPEAT LABS ARE DUE NOW 90 tablet 0  . Flaxseed, Linseed, (FLAX SEED OIL PO) Take by mouth daily.      [name] lisinopril (PRINIVIL,ZESTRIL) 20 MG tablet Take 1 tablet (20 mg total) by mouth daily. 90 tablet 3  . LORazepam (ATIVAN) 0.5 MG tablet 2 bid  1  qday  prn 150 tablet 5  . Multiple Vitamin (MULTIVITAMIN) tablet Take 1 tablet by mouth daily.    [name] NEXIUM 40 MG capsule TAKE 1 CAPSULE BY MOUTH AS NEEDED 90 capsule 0  . rosuvastatin (CRESTOR) 40 MG tablet Take  1 tablet (40 mg total) by mouth daily. 90 tablet 3  . tamsulosin (FLOMAX) 0.4 MG CAPS capsule 1 po qd 30 capsule   . traMADol (ULTRAM) 50 MG tablet Take 1 tablet (50 mg total) by mouth every 8 (eight) hours as needed. 20 tablet 0  . zolpidem (AMBIEN) 10 MG tablet Take 1 tablet (10 mg total) by mouth at bedtime as needed for sleep. 30 tablet 5   No current facility-administered medications on file prior to visit.      Objective:  Objective  Physical Exam  Constitutional: He is oriented to person, place, and time. He appears well-developed and well-nourished.  HENT:  Right Ear: External ear normal.  Left Ear: External ear normal.  Mouth/Throat: Posterior  oropharyngeal erythema present. No oropharyngeal exudate.  + PND + errythema  Eyes: Conjunctivae are normal. Right eye exhibits no discharge. Left eye exhibits no discharge.  Neck: Normal range of motion. Neck supple.  Cardiovascular: Normal rate, regular rhythm and normal heart sounds.   No murmur heard. Pulmonary/Chest: Effort normal and breath sounds normal. No respiratory distress. He has no wheezes. He has no rales. He exhibits no tenderness.  Musculoskeletal: He exhibits no edema.  Lymphadenopathy:    He has cervical adenopathy.  Neurological: He is alert and oriented to person, place, and time.  Nursing note and vitals reviewed.  BP 110/78 (BP Location: Left Arm, Patient Position: Sitting, Cuff Size: Normal)   Pulse 67   Temp 98 F (36.7 C) (Oral)   Wt 230 lb 12.8 oz (104.7 kg)   SpO2 98%   BMI 32.19 kg/m  Wt Readings from Last 3 Encounters:  02/05/16 230 lb 12.8 oz (104.7 kg)  01/13/16 232 lb 9.6 oz (105.5 kg)  11/04/15 236 lb 12.8 oz (107.4 kg)     Lab Results  Component Value Date   WBC 8.5 07/17/2015   HGB 13.6 07/17/2015   HCT 41.7 07/17/2015   PLT 294.0 07/17/2015   GLUCOSE 93 01/08/2016   CHOL 134 01/08/2016   TRIG 93.0 01/08/2016   HDL 47.20 01/08/2016   LDLCALC 68 01/08/2016   ALT 26 01/08/2016   AST 24 01/08/2016   NA 139 01/08/2016   K 3.8 01/08/2016   CL 104 01/08/2016   CREATININE 1.38 01/08/2016   BUN 24 (H) 01/08/2016   CO2 27 01/08/2016   TSH 1.668 10/16/2013   PSA 2.14 07/17/2015   INR 1.0 11/08/2008   HGBA1C 6.1 06/27/2014    No results found.   Assessment & Plan:  Plan  I have discontinued Mr. Franssen cephALEXin. I am also having him start on amoxicillin and levocetirizine. Additionally, I am having him maintain his (Flaxseed, Linseed, (FLAX SEED OIL PO)), B Complex Vitamins (VITAMIN B COMPLEX PO), Calcium Carb-Cholecalciferol (CALCIUM 1000 + D PO), aspirin, multivitamin, Vitamin D, zolpidem, tamsulosin, rosuvastatin, LORazepam,  carbamazepine, Eszopiclone, fenofibrate, NEXIUM, doxycycline, traMADol, and lisinopril.  Meds ordered this encounter  Medications  . amoxicillin (AMOXIL) 875 MG tablet    Sig: Take 1 tablet (875 mg total) by mouth 2 (two) times daily.    Dispense:  20 tablet    Refill:  0  . levocetirizine (XYZAL) 5 MG tablet    Sig: Take 1 tablet (5 mg total) by mouth every evening.    Dispense:  30 tablet    Refill:  5    Problem List Items Addressed This Visit    None    Visit Diagnoses    Streptococcal sore throat    -  Primary   Relevant Orders   POCT rapid strep A (Completed)   Acute pharyngitis due to other specified organisms       Relevant Medications   amoxicillin (AMOXIL) 875 MG tablet   levocetirizine (XYZAL) 5 MG tablet   Other Relevant Orders   Culture, Group A Strep      Follow-up: No Follow-up on file.  Ann Held, DO

## 2016-02-05 NOTE — Patient Instructions (Signed)

## 2016-02-05 NOTE — Progress Notes (Signed)
Pre visit review using our clinic review tool, if applicable. No additional management support is needed unless otherwise documented below in the visit note. 

## 2016-02-07 LAB — CULTURE, GROUP A STREP: ORGANISM ID, BACTERIA: NORMAL

## 2016-02-09 ENCOUNTER — Other Ambulatory Visit: Payer: Self-pay | Admitting: General Surgery

## 2016-02-09 DIAGNOSIS — R222 Localized swelling, mass and lump, trunk: Secondary | ICD-10-CM | POA: Diagnosis not present

## 2016-02-17 ENCOUNTER — Inpatient Hospital Stay: Admit: 2016-02-17 | Discharge: 2016-02-18 | Primary: Internal Medicine

## 2016-02-17 DIAGNOSIS — N184 Chronic kidney disease, stage 4 (severe): Principal | ICD-10-CM

## 2016-02-17 DIAGNOSIS — D631 Anemia in chronic kidney disease: Secondary | ICD-10-CM

## 2016-02-17 MED ORDER — EPOETIN ALFA 20000 UNIT/ML IJ SOLN
20000 [IU] | Freq: Once | SUBCUTANEOUS | Status: CP
Start: 2016-02-17 — End: ?

## 2016-02-17 MED ORDER — EPOETIN ALFA 20000 UNIT/ML IJ SOLN
20000 [IU] | Freq: Once | SUBCUTANEOUS | Status: CN
Start: 2016-02-17 — End: ?

## 2016-02-23 ENCOUNTER — Other Ambulatory Visit (HOSPITAL_COMMUNITY): Payer: Self-pay | Admitting: Psychiatry

## 2016-02-25 MED FILL — LORazepam 0.5 MG TABS: 0.5 | 30 days supply | Qty: 150 | Fill #2

## 2016-02-26 ENCOUNTER — Telehealth (HOSPITAL_COMMUNITY): Payer: Self-pay

## 2016-02-26 NOTE — Telephone Encounter (Signed)
Fax received from the patient pharmacy for a refill on Ambien. Patient is due and has a follow up appoitment. Please review and advise, thank you

## 2016-02-27 ENCOUNTER — Encounter (HOSPITAL_COMMUNITY): Payer: Self-pay | Admitting: Psychiatry

## 2016-02-27 ENCOUNTER — Encounter (HOSPITAL_BASED_OUTPATIENT_CLINIC_OR_DEPARTMENT_OTHER): Payer: Self-pay | Admitting: *Deleted

## 2016-02-27 ENCOUNTER — Ambulatory Visit (INDEPENDENT_AMBULATORY_CARE_PROVIDER_SITE_OTHER): Payer: PPO | Admitting: Psychiatry

## 2016-02-27 VITALS — BP 128/76 | HR 71 | Ht 70.5 in | Wt 234.0 lb

## 2016-02-27 DIAGNOSIS — F0630 Mood disorder due to known physiological condition, unspecified: Secondary | ICD-10-CM | POA: Diagnosis not present

## 2016-02-27 DIAGNOSIS — F3162 Bipolar disorder, current episode mixed, moderate: Secondary | ICD-10-CM

## 2016-02-27 MED ORDER — CARBAMAZEPINE ER 100 MG PO TB12: ORAL_TABLET | ORAL | 8 refills | Status: DC

## 2016-02-27 MED ORDER — ZOLPIDEM TARTRATE 10 MG PO TABS: 10.0000 mg | ORAL_TABLET | Freq: Every evening | ORAL | 5 refills | Status: DC | PRN

## 2016-02-27 MED ORDER — LORAZEPAM 0.5 MG PO TABS: ORAL_TABLET | ORAL | 5 refills | Status: DC

## 2016-02-27 MED FILL — CARBAMAZEPINE ER 100 MG TAB: 100 | 30 days supply | Qty: 60 | Fill #0

## 2016-02-27 MED FILL — ZOLPIDEM TARTRATE 10 MG TAB: 10 | 30 days supply | Qty: 30 | Fill #0

## 2016-02-27 NOTE — Progress Notes (Signed)
Patient ID: William [name], male   DOB: 1940-07-30, 75 y.o.   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 The patient is doing very well. He seems calm. He continues to work out of the gym. History. The patient does not have a neurological appointment scheduled but has a primary care doctor sees regularly. The patient denies daily depression. He is sleeping and eating well. He's got good energy. The patient says she is concentrating well. He is a good sense of worth. He is not suicidal. He's going to have surgery and have a cyst moved on his back. And his wife is going to have hip surgery in the next few weeks. Probably be left alone for 1 day and I think he can handle. The patient does not drink any alcohol. He is mildly irritability but seems to be well contained. He shows no evidence of psychosis. He is multiple cardiovascular risk factors including hypertension and high cholesterol. These are well controlled at this time. His only significant psychosocial stressor that is new is that his son is getting divorced. His wife said he is doing well. She thinks is very stable and she's very pleased with his care. Patient certainly is not suicidal and is functioning very well.  Past Medical History:  Past Medical History:  Diagnosis Date  . ANXIETY   . Bladder stone   . BPH (benign prostatic hypertrophy)   . Carotid artery occlusion   . 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 CVD (cerebrovascular disease)   . Eczema   . GERD (gastroesophageal reflux disease)   . History of carotid artery stenosis    S/P BILATERAL CEA  . 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 Hyperlipidemia   . Nocturia   . S/P carotid endarterectomy    BILATERAL ICA--  PATENT PER DUPLEX  05-19-2012  . Stroke [facility])  1996   left side weakness  . Urinary frequency   . Vitamin D deficiency     Past Surgical History:  Procedure Laterality Date  . APPENDECTOMY  AS CHILD  . CARDIOVASCULAR STRESS TEST  03-27-2012  DR CRENSHAW   LOW RISK LEXISCAN STUDY-- PROBABLE NORMAL PERFUSION AND SOFT TISSUE ATTENUATION/  NO ISCHEMIA/ EF 51%  . CAROTID ENDARTERECTOMY Bilateral LEFT  11-12-2008  DR GREG HAYES   RIGHT ICA  2006  ([facility])  . CYSTOSCOPY WITH LITHOLAPAXY N/A 02/26/2013   Procedure: CYSTOSCOPY WITH LITHOLAPAXY;  Surgeon: Franchot Gallo, MD;  Location: [facility];  Service: Urology;  Laterality: N/A;  . EYE SURGERY  Jan. 2016   cataract surgery both eyes  . INGUINAL HERNIA REPAIR Right 11-08-2006  . 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 PROSTATE SURGERY    . TRANSURETHRAL RESECTION OF PROSTATE N/A 02/26/2013   Procedure: TRANSURETHRAL RESECTION OF THE PROSTATE WITH GYRUS INSTRUMENTS;  Surgeon: Franchot Gallo, MD;  Location: [facility];  Service: Urology;  Laterality: N/A;   Family History:  Family History  Problem Relation Age of Onset  . Heart disease Mother     CHF  . Bipolar disorder Mother   . Heart disease Father     CHF   Family Psychiatric  History:  Social History:  History  Alcohol  Use  . 0.0 oz/week    Comment: Occasional     History  Drug Use No    Social History   Social History  . Marital status: Married    Spouse name: N/A  . Number of children: 2  . Years of education: N/A   Occupational History  .  Retired   Social History Main Topics  . Smoking status: Former Smoker    Packs/day: 2.00    Years: 40.00    Types: Cigarettes    Quit date: 02/15/2005  . Smokeless tobacco: Never Used  . Alcohol use 0.0 oz/week     Comment: Occasional  . Drug use: No  . Sexual activity: Yes    Partners: Female   Other Topics Concern  . None   Social History Narrative   Exercise--  Engineer, manufacturing everyday   Additional Social  History:                         Sleep: Good  Appetite:  Good  Current Medications: Current Outpatient Prescriptions  Medication Sig Dispense Refill  . amoxicillin (AMOXIL) 875 MG tablet Take 1 tablet (875 mg total) by mouth 2 (two) times daily. 20 tablet 0  . aspirin 325 MG EC tablet Take 325 mg by mouth daily.    . B Complex Vitamins (VITAMIN B COMPLEX PO) Take by mouth daily.      . Calcium Carb-Cholecalciferol (CALCIUM 1000 + D PO) Take by mouth daily.      . carbamazepine (TEGRETOL-XR) 100 MG 12 hr tablet 1 qam  1  qhs 60 tablet 8  . Cholecalciferol (VITAMIN D) 2000 UNITS CAPS Take by mouth.    . doxycycline (VIBRA-TABS) 100 MG tablet Take 1 tablet (100 mg total) by mouth 2 (two) times daily. 14 tablet 0  . Eszopiclone 3 MG TABS TAKE 1 TABLET BY MOUTH AT BEDTIME AS NEEDED TAKE IMMEDIATELY BEFORE BEDTIME 30 tablet 1  . fenofibrate 160 MG tablet Take 1 tablet (160 mg total) by mouth daily. REPEAT LABS ARE DUE NOW 90 tablet 0  . Flaxseed, Linseed, (FLAX SEED OIL PO) Take by mouth daily.      [name] levocetirizine (XYZAL) 5 MG tablet Take 1 tablet (5 mg total) by mouth every evening. 30 tablet 5  . lisinopril (PRINIVIL,ZESTRIL) 20 MG tablet Take 1 tablet (20 mg total) by mouth daily. 90 tablet 3  . LORazepam (ATIVAN) 0.5 MG tablet 2 bid  1  qday  prn 150 tablet 5  . Multiple Vitamin (MULTIVITAMIN) tablet Take 1 tablet by mouth daily.    [name] NEXIUM 40 MG capsule TAKE 1 CAPSULE BY MOUTH AS NEEDED 90 capsule 0  . rosuvastatin (CRESTOR) 40 MG tablet Take 1 tablet (40 mg total) by mouth daily. 90 tablet 3  . tamsulosin (FLOMAX) 0.4 MG CAPS capsule 1 po qd 30 capsule   . traMADol (ULTRAM) 50 MG tablet Take 1 tablet (50 mg total) by mouth every 8 (eight) hours as needed. 20 tablet 0  . zolpidem (AMBIEN) 10 MG tablet Take 1 tablet (10 mg total) by mouth at bedtime as needed for sleep. 30 tablet 5   No current facility-administered medications for this visit.     Lab Results: No results  found for this or any previous visit (from the past 48 hour(s)).  Physical Findings: AIMS:  , ,  ,  ,    CIWA:    COWS:     Musculoskeletal: Strength &  Muscle Tone: within normal limits Gait & Station: normal Patient leans: Right  Psychiatric Specialty Exam: ROS  Blood pressure 128/76, pulse 71, height 5' 10.5" (1.791 m), weight 234 lb (106.1 kg).Body mass index is 33.1 kg/m.  General Appearance: Casual  Eye Contact::  Good  Speech:  Clear and Coherent  Volume:  Normal  Mood:  Dysphoric and Euthymic  Affect:  Appropriate  Thought Process:  Coherent  Orientation:  Full (Time, Place, and Person)  Thought Content:  WDL  Suicidal Thoughts:  No  Homicidal Thoughts:  No  Memory:  NA  Judgement:  Good  Insight:  Fair  Psychomotor Activity:  Normal  Concentration:  Good  Recall:  Good  Fund of Knowledge:Good  Language: Good  Akathisia:  No  Handed:  Right  AIMS (if indicated):     Assets:  Desire for Improvement  ADL's:  Intact  Cognition: WNL  Sleep:      Treatment Plan Summary: 02/27/2016, 10:23 AM Today the patient will continue on his medications. He'll continue taking Tegretol. On her last visit we added next her dose of Ativan to take on a when necessary basis which both of them says has been helpful. He takes 0.5 mg 2 in the morning and 2 at night and will take an extra 0.5 when necessary when his good allow her to something that he thinks will be very anxiety provoking. It seems to work well. It might be very important as he can have surgery for cyst on his back which are probably make him anxious. He also might feel anxious because his wife's to have surgery. Today the patient will get a Tegretol blood level and a comprehensive metabolic panel. He'll continue taking Tegretol as ordered and he'll return to see me in 4 months for a 30 minute visit. He is very stable.

## 2016-03-01 ENCOUNTER — Encounter (HOSPITAL_BASED_OUTPATIENT_CLINIC_OR_DEPARTMENT_OTHER)
Admission: RE | Admit: 2016-03-01 | Discharge: 2016-03-01 | Disposition: A | Discharge status: Home / Self Care | Payer: PPO | Source: Ambulatory Visit | Attending: General Surgery | Admitting: General Surgery

## 2016-03-01 ENCOUNTER — Other Ambulatory Visit: Payer: Self-pay

## 2016-03-01 DIAGNOSIS — Z6832 Body mass index (BMI) 32.0-32.9, adult: Secondary | ICD-10-CM | POA: Diagnosis not present

## 2016-03-01 DIAGNOSIS — Z91040 Latex allergy status: Secondary | ICD-10-CM | POA: Diagnosis not present

## 2016-03-01 DIAGNOSIS — K219 Gastro-esophageal reflux disease without esophagitis: Secondary | ICD-10-CM | POA: Diagnosis not present

## 2016-03-01 DIAGNOSIS — M1990 Unspecified osteoarthritis, unspecified site: Secondary | ICD-10-CM | POA: Diagnosis not present

## 2016-03-01 DIAGNOSIS — F419 Anxiety disorder, unspecified: Secondary | ICD-10-CM | POA: Diagnosis not present

## 2016-03-01 DIAGNOSIS — L720 Epidermal cyst: Secondary | ICD-10-CM | POA: Diagnosis not present

## 2016-03-01 DIAGNOSIS — Z87891 Personal history of nicotine dependence: Secondary | ICD-10-CM | POA: Diagnosis not present

## 2016-03-01 DIAGNOSIS — I69354 Hemiplegia and hemiparesis following cerebral infarction affecting left non-dominant side: Secondary | ICD-10-CM | POA: Diagnosis not present

## 2016-03-01 DIAGNOSIS — F329 Major depressive disorder, single episode, unspecified: Secondary | ICD-10-CM | POA: Diagnosis not present

## 2016-03-01 DIAGNOSIS — E669 Obesity, unspecified: Secondary | ICD-10-CM | POA: Diagnosis not present

## 2016-03-01 DIAGNOSIS — N189 Chronic kidney disease, unspecified: Secondary | ICD-10-CM | POA: Diagnosis not present

## 2016-03-01 DIAGNOSIS — R222 Localized swelling, mass and lump, trunk: Secondary | ICD-10-CM | POA: Diagnosis not present

## 2016-03-01 DIAGNOSIS — Z91018 Allergy to other foods: Secondary | ICD-10-CM | POA: Diagnosis not present

## 2016-03-01 DIAGNOSIS — I129 Hypertensive chronic kidney disease with stage 1 through stage 4 chronic kidney disease, or unspecified chronic kidney disease: Secondary | ICD-10-CM | POA: Diagnosis not present

## 2016-03-01 LAB — BASIC METABOLIC PANEL
ANION GAP: 8 (ref 5–15)
BUN: 18 mg/dL (ref 6–20)
CALCIUM: 9.4 mg/dL (ref 8.9–10.3)
CO2: 22 mmol/L (ref 22–32)
Chloride: 109 mmol/L (ref 101–111)
Creatinine, Ser: 1.32 mg/dL — ABNORMAL HIGH (ref 0.61–1.24)
GFR calc Af Amer: >60 mL/min (ref >60)
GFR, EST NON AFRICAN AMERICAN: 52 mL/min — AB (ref >60)
GLUCOSE: 177 mg/dL — AB (ref 65–99)
POTASSIUM: 3.9 mmol/L (ref 3.5–5.1)
SODIUM: 139 mmol/L (ref 135–145)

## 2016-03-01 LAB — CARBAMAZEPINE LEVEL, TOTAL: CARBAMAZEPINE LVL: 3.8 ug/mL — AB (ref 4.0–12.0)

## 2016-03-02 ENCOUNTER — Inpatient Hospital Stay: Admit: 2016-03-02 | Discharge: 2016-03-03 | Primary: Internal Medicine

## 2016-03-02 DIAGNOSIS — N184 Chronic kidney disease, stage 4 (severe): Principal | ICD-10-CM

## 2016-03-02 DIAGNOSIS — D631 Anemia in chronic kidney disease: Secondary | ICD-10-CM

## 2016-03-02 MED ORDER — EPOETIN ALFA 20000 UNIT/ML IJ SOLN
20000 [IU] | Freq: Once | SUBCUTANEOUS | Status: CP
Start: 2016-03-02 — End: ?

## 2016-03-02 MED ORDER — EPOETIN ALFA 20000 UNIT/ML IJ SOLN
20000 [IU] | Freq: Once | SUBCUTANEOUS | Status: CN
Start: 2016-03-02 — End: ?

## 2016-03-03 ENCOUNTER — Encounter (HOSPITAL_BASED_OUTPATIENT_CLINIC_OR_DEPARTMENT_OTHER)
Admission: RE | Disposition: A | Discharge status: Home / Self Care | Payer: Self-pay | Source: Ambulatory Visit | Attending: General Surgery

## 2016-03-03 ENCOUNTER — Encounter (HOSPITAL_BASED_OUTPATIENT_CLINIC_OR_DEPARTMENT_OTHER): Payer: Self-pay | Admitting: Certified Registered"

## 2016-03-03 ENCOUNTER — Ambulatory Visit (HOSPITAL_BASED_OUTPATIENT_CLINIC_OR_DEPARTMENT_OTHER)
Admission: RE | Admit: 2016-03-03 | Discharge: 2016-03-03 | Disposition: A | Discharge status: Home / Self Care | Payer: PPO | Source: Ambulatory Visit | Attending: General Surgery | Admitting: General Surgery

## 2016-03-03 ENCOUNTER — Ambulatory Visit (HOSPITAL_BASED_OUTPATIENT_CLINIC_OR_DEPARTMENT_OTHER): Payer: PPO | Admitting: Certified Registered"

## 2016-03-03 DIAGNOSIS — I129 Hypertensive chronic kidney disease with stage 1 through stage 4 chronic kidney disease, or unspecified chronic kidney disease: Secondary | ICD-10-CM | POA: Diagnosis not present

## 2016-03-03 DIAGNOSIS — F329 Major depressive disorder, single episode, unspecified: Secondary | ICD-10-CM | POA: Diagnosis not present

## 2016-03-03 DIAGNOSIS — Z91018 Allergy to other foods: Secondary | ICD-10-CM | POA: Diagnosis not present

## 2016-03-03 DIAGNOSIS — L720 Epidermal cyst: Secondary | ICD-10-CM | POA: Insufficient documentation

## 2016-03-03 DIAGNOSIS — F419 Anxiety disorder, unspecified: Secondary | ICD-10-CM | POA: Insufficient documentation

## 2016-03-03 DIAGNOSIS — E669 Obesity, unspecified: Secondary | ICD-10-CM | POA: Diagnosis not present

## 2016-03-03 DIAGNOSIS — M1990 Unspecified osteoarthritis, unspecified site: Secondary | ICD-10-CM | POA: Diagnosis not present

## 2016-03-03 DIAGNOSIS — Z91040 Latex allergy status: Secondary | ICD-10-CM | POA: Diagnosis not present

## 2016-03-03 DIAGNOSIS — I69354 Hemiplegia and hemiparesis following cerebral infarction affecting left non-dominant side: Secondary | ICD-10-CM | POA: Diagnosis not present

## 2016-03-03 DIAGNOSIS — N189 Chronic kidney disease, unspecified: Secondary | ICD-10-CM | POA: Diagnosis not present

## 2016-03-03 DIAGNOSIS — Z6832 Body mass index (BMI) 32.0-32.9, adult: Secondary | ICD-10-CM | POA: Insufficient documentation

## 2016-03-03 DIAGNOSIS — L728 Other follicular cysts of the skin and subcutaneous tissue: Secondary | ICD-10-CM | POA: Diagnosis not present

## 2016-03-03 DIAGNOSIS — Z87891 Personal history of nicotine dependence: Secondary | ICD-10-CM | POA: Diagnosis not present

## 2016-03-03 DIAGNOSIS — K219 Gastro-esophageal reflux disease without esophagitis: Secondary | ICD-10-CM | POA: Insufficient documentation

## 2016-03-03 DIAGNOSIS — R51 Headache: Secondary | ICD-10-CM | POA: Diagnosis not present

## 2016-03-03 DIAGNOSIS — R222 Localized swelling, mass and lump, trunk: Secondary | ICD-10-CM | POA: Diagnosis not present

## 2016-03-03 HISTORY — PX: MASS EXCISION: SHX2000

## 2016-03-03 SURGERY — EXCISION MASS
Anesthesia: Monitor Anesthesia Care | Site: Back

## 2016-03-03 MED ORDER — PROPOFOL 500 MG/50ML IV EMUL
INTRAVENOUS | Status: DC | PRN
  Administered 2016-03-03: 100 ug/kg/min via INTRAVENOUS

## 2016-03-03 MED ORDER — LIDOCAINE HCL 1 % IJ SOLN
INTRAMUSCULAR | Status: DC | PRN
  Administered 2016-03-03: 7 mL via SUBCUTANEOUS

## 2016-03-03 MED ORDER — CHLORHEXIDINE GLUCONATE CLOTH 2 % EX PADS: 6.0000 | MEDICATED_PAD | Freq: Once | CUTANEOUS | Status: DC

## 2016-03-03 MED ORDER — FENTANYL CITRATE (PF) 100 MCG/2ML IJ SOLN
50.0000 ug | INTRAMUSCULAR | Status: DC | PRN
  Administered 2016-03-03: 50 ug via INTRAVENOUS
  Administered 2016-03-03: 25 ug via INTRAVENOUS

## 2016-03-03 MED ORDER — SCOPOLAMINE 1 MG/3DAYS TD PT72: 1.0000 | MEDICATED_PATCH | Freq: Once | TRANSDERMAL | Status: DC | PRN

## 2016-03-03 MED ORDER — CEFAZOLIN SODIUM-DEXTROSE 2-4 GM/100ML-% IV SOLN
2.0000 g | INTRAVENOUS | Status: AC
  Administered 2016-03-03: 2 g via INTRAVENOUS

## 2016-03-03 MED ORDER — FENTANYL CITRATE (PF) 100 MCG/2ML IJ SOLN
INTRAMUSCULAR | Status: AC
  Filled 2016-03-03: qty 2

## 2016-03-03 MED ORDER — MIDAZOLAM HCL 2 MG/2ML IJ SOLN: 1.0000 mg | INTRAMUSCULAR | Status: DC | PRN

## 2016-03-03 MED ORDER — LACTATED RINGERS IV SOLN
INTRAVENOUS | Status: DC
  Administered 2016-03-03: 10:00:00 via INTRAVENOUS

## 2016-03-03 MED ORDER — ONDANSETRON HCL 4 MG/2ML IJ SOLN
INTRAMUSCULAR | Status: DC | PRN
  Administered 2016-03-03: 4 mg via INTRAVENOUS

## 2016-03-03 MED ORDER — GLYCOPYRROLATE 0.2 MG/ML IJ SOLN: 0.2000 mg | Freq: Once | INTRAMUSCULAR | Status: DC | PRN

## 2016-03-03 MED ORDER — HYDROCODONE-ACETAMINOPHEN 5-325 MG PO TABS: 1.0000 | ORAL_TABLET | ORAL | 0 refills | Status: DC | PRN

## 2016-03-03 MED ORDER — CEFAZOLIN SODIUM-DEXTROSE 2-4 GM/100ML-% IV SOLN
INTRAVENOUS | Status: AC
  Filled 2016-03-03: qty 100

## 2016-03-03 MED ORDER — LIDOCAINE HCL (CARDIAC) 20 MG/ML IV SOLN
INTRAVENOUS | Status: DC | PRN
  Administered 2016-03-03: 25 mg via INTRATRACHEAL

## 2016-03-03 MED ORDER — PROPOFOL 500 MG/50ML IV EMUL
INTRAVENOUS | Status: AC
  Filled 2016-03-03: qty 50

## 2016-03-03 MED FILL — HYDROCODON-APAP 5-325: 5-325 | 2 days supply | Qty: 20 | Fill #0

## 2016-03-03 SURGICAL SUPPLY — 51 items
BLADE HEX COATED 2.75 (ELECTRODE) ×2 IMPLANT
BLADE SURG 10 STRL SS (BLADE) ×1 IMPLANT
BLADE SURG 15 STRL LF DISP TIS (BLADE) ×1 IMPLANT
BLADE SURG 15 STRL SS (BLADE) ×2
CANISTER SUCT 1200ML W/VALVE (MISCELLANEOUS) IMPLANT
CHLORAPREP W/TINT 26ML (MISCELLANEOUS) ×2 IMPLANT
COVER BACK TABLE 60X90IN (DRAPES) ×1 IMPLANT
COVER MAYO STAND STRL (DRAPES) ×1 IMPLANT
DECANTER SPIKE VIAL GLASS SM (MISCELLANEOUS) IMPLANT
DRAPE LAPAROTOMY 100X72 PEDS (DRAPES) ×1 IMPLANT
DRAPE UTILITY XL STRL (DRAPES) ×2 IMPLANT
ELECT REM PT RETURN 9FT ADLT (ELECTROSURGICAL) ×2
ELECTRODE REM PT RTRN 9FT ADLT (ELECTROSURGICAL) ×1 IMPLANT
GLOVE BIO SURGEON STRL SZ 6 (GLOVE) ×2 IMPLANT
GLOVE BIO SURGEON STRL SZ7.5 (GLOVE) ×1 IMPLANT
GLOVE BIOGEL PI IND STRL 6.5 (GLOVE) ×1 IMPLANT
GLOVE BIOGEL PI IND STRL 7.0 (GLOVE) IMPLANT
GLOVE BIOGEL PI IND STRL 8 (GLOVE) IMPLANT
GLOVE BIOGEL PI INDICATOR 6.5 (GLOVE) ×1
GLOVE BIOGEL PI INDICATOR 7.0 (GLOVE) ×2
GLOVE BIOGEL PI INDICATOR 8 (GLOVE) ×1
GLOVE ECLIPSE 6.5 STRL STRAW (GLOVE) ×1 IMPLANT
GOWN STRL REUS W/ TWL LRG LVL3 (GOWN DISPOSABLE) ×1 IMPLANT
GOWN STRL REUS W/ TWL XL LVL3 (GOWN DISPOSABLE) IMPLANT
GOWN STRL REUS W/TWL 2XL LVL3 (GOWN DISPOSABLE) ×2 IMPLANT
GOWN STRL REUS W/TWL LRG LVL3 (GOWN DISPOSABLE) ×2
GOWN STRL REUS W/TWL XL LVL3 (GOWN DISPOSABLE) ×2
LIQUID BAND (GAUZE/BANDAGES/DRESSINGS) ×1 IMPLANT
NDL HYPO 25X1 1.5 SAFETY (NEEDLE) ×1 IMPLANT
NEEDLE HYPO 25X1 1.5 SAFETY (NEEDLE) ×2 IMPLANT
NS IRRIG 1000ML POUR BTL (IV SOLUTION) ×1 IMPLANT
PACK BASIN DAY SURGERY FS (CUSTOM PROCEDURE TRAY) ×2 IMPLANT
PACK UNIVERSAL I (CUSTOM PROCEDURE TRAY) IMPLANT
PENCIL BUTTON HOLSTER BLD 10FT (ELECTRODE) ×2 IMPLANT
SLEEVE SCD COMPRESS KNEE MED (MISCELLANEOUS) IMPLANT
SPONGE GAUZE 4X4 12PLY STER LF (GAUZE/BANDAGES/DRESSINGS) ×2 IMPLANT
SPONGE LAP 18X18 X RAY DECT (DISPOSABLE) ×2 IMPLANT
STAPLER VISISTAT 35W (STAPLE) IMPLANT
SUT MNCRL AB 4-0 PS2 18 (SUTURE) ×2 IMPLANT
SUT SILK 3 0 TIES 17X18 (SUTURE)
SUT SILK 3-0 18XBRD TIE BLK (SUTURE) IMPLANT
SUT VIC AB 2-0 SH 27 (SUTURE) ×2
SUT VIC AB 2-0 SH 27XBRD (SUTURE) IMPLANT
SUT VIC AB 3-0 SH 27 (SUTURE) ×2
SUT VIC AB 3-0 SH 27X BRD (SUTURE) ×1 IMPLANT
SUT VICRYL 4-0 PS2 18IN ABS (SUTURE) ×2 IMPLANT
SYR CONTROL 10ML LL (SYRINGE) ×2 IMPLANT
TOWEL OR 17X24 6PK STRL BLUE (TOWEL DISPOSABLE) ×2 IMPLANT
TOWEL OR NON WOVEN STRL DISP B (DISPOSABLE) ×2 IMPLANT
TUBE CONNECTING 20X1/4 (TUBING) IMPLANT
YANKAUER SUCT BULB TIP NO VENT (SUCTIONS) IMPLANT

## 2016-03-03 NOTE — Anesthesia Procedure Notes (Signed)
Procedure Name: MAC Date/Time: 03/03/2016 10:50 AM Performed by: Baxter Flattery Pre-anesthesia Checklist: Patient identified, Emergency Drugs available, Suction available and Patient being monitored Patient Re-evaluated:Patient Re-evaluated prior to inductionOxygen Delivery Method: Simple face mask Preoxygenation: Pre-oxygenation with 100% oxygen Intubation Type: IV induction Ventilation: Mask ventilation without difficulty

## 2016-03-03 NOTE — Anesthesia Preprocedure Evaluation (Addendum)
Anesthesia Evaluation  Patient identified by MRN, date of birth, ID band Patient awake    Reviewed: Allergy & Precautions, NPO status , Patient's Chart, lab work & pertinent test results  History of Anesthesia Complications Negative for: history of anesthetic complications  Airway Mallampati: II  TM Distance: >3 FB Neck ROM: Full    Dental  (+) Partial Upper, Dental Advisory Given   Pulmonary neg shortness of breath, neg sleep apnea, neg COPD, neg recent URI, former smoker,    Pulmonary exam normal breath sounds clear to auscultation       Cardiovascular hypertension, Pt. on medications (-) angina(-) Past MI, (-) Cardiac Stents and (-) CABG (-) dysrhythmias  Rhythm:Regular Rate:Normal  HLD, h/o carotid stenosis s/p bilateral CEA    Neuro/Psych  Headaches, neg Seizures PSYCHIATRIC DISORDERS Anxiety Depression CVA (2006, weakness of left arm), Residual Symptoms    GI/Hepatic Neg liver ROS, GERD  Controlled,  Endo/Other  negative endocrine ROS  Renal/GU CRFRenal disease     Musculoskeletal  (+) Arthritis ,   Abdominal (+) + obese,   Peds  Hematology negative hematology ROS (+)   Anesthesia Other Findings   Reproductive/Obstetrics                            Anesthesia Physical Anesthesia Plan  ASA: III  Anesthesia Plan: MAC   Post-op Pain Management:    Induction: Intravenous  Airway Management Planned: Natural Airway and Nasal Cannula  Additional Equipment:   Intra-op Plan:   Post-operative Plan:   Informed Consent: I have reviewed the patients History and Physical, chart, labs and discussed the procedure including the risks, benefits and alternatives for the proposed anesthesia with the patient or authorized representative who has indicated his/her understanding and acceptance.   Dental advisory given  Plan Discussed with: CRNA  Anesthesia Plan Comments:         Anesthesia Quick Evaluation

## 2016-03-03 NOTE — Interval H&P Note (Signed)
History and Physical Interval Note:  03/03/2016 10:24 AM  William [name]  has presented today for surgery, with the diagnosis of back mass  The various methods of treatment have been discussed with the patient and family. After consideration of risks, benefits and other options for treatment, the patient has consented to  Procedure(s): EXCISION OF BACK  MASS (N/A) as a surgical intervention .  The patient's history has been reviewed, patient examined, no change in status, stable for surgery.  I have reviewed the patient's chart and labs.  Questions were answered to the patient's satisfaction.     [name]

## 2016-03-03 NOTE — Discharge Instructions (Addendum)
[facility] Phone Number [phone]   POST OP INSTRUCTIONS  Always review your discharge instruction sheet given to you by the facility where your surgery was performed.  IF YOU HAVE DISABILITY OR FAMILY LEAVE FORMS, YOU MUST BRING THEM TO THE OFFICE FOR PROCESSING.  DO NOT GIVE THEM TO YOUR DOCTOR.  1. A prescription for pain medication may be given to you upon discharge.  Take your pain medication as prescribed, if needed.  If narcotic pain medicine is not needed, then you may take acetaminophen (Tylenol) or ibuprofen (Advil) as needed. 2. Take your usually prescribed medications unless otherwise directed 3. If you need a refill on your pain medication, please contact your pharmacy.  They will contact our office to request authorization.  Prescriptions will not be filled after 5pm or on week-ends. 4. You should eat very light the first 24 hours after surgery, such as soup, crackers, pudding, etc.  Resume your normal diet the day after surgery 5. It is common to experience some constipation if taking pain medication after surgery.  Increasing fluid intake and taking a stool softener will usually help or prevent this problem from occurring.  A mild laxative (Milk of Magnesia or Miralax) should be taken according to package directions if there are no bowel movements after 48 hours. 6. You may shower in 48 hours.  The surgical glue will flake off in 2-3 weeks.   7. ACTIVITIES:  No strenuous activity or heavy lifting for 1 week.   a. You may drive when you no longer are taking prescription pain medication, you can comfortably wear a seatbelt, and you can safely maneuver your car and apply brakes. b. RETURN TO WORK:  _________1 week_____________ William [name] should see your doctor in the office for a follow-up appointment approximately three-four weeks after your surgery.    WHEN TO CALL YOUR DOCTOR: 1. Fever over 101.0 2. Nausea and/or vomiting. 3. Extreme swelling or  bruising. 4. Continued bleeding from incision. 5. Increased pain, redness, or drainage from the incision.  The clinic staff is available to answer your questions during regular business hours.  Please dont hesitate to call and ask to speak to one of the nurses for clinical concerns.  If you have a medical emergency, go to the nearest emergency room or call 911.  A surgeon from [facility] is always on call at the hospital.  For further questions, please visit [email or web address]    Post Anesthesia Home Care Instructions  Activity: Get plenty of rest for the remainder of the day. A responsible adult should stay with you for 24 hours following the procedure.  For the next 24 hours, DO NOT: -Drive a car -Paediatric nurse -Drink alcoholic beverages -Take any medication unless instructed by your physician -Make any legal decisions or sign important papers.  Meals: Start with liquid foods such as gelatin or soup. Progress to regular foods as tolerated. Avoid greasy, spicy, heavy foods. If nausea and/or vomiting occur, drink only clear liquids until the nausea and/or vomiting subsides. Call your physician if vomiting continues.  Special Instructions/Symptoms: Your throat may feel dry or sore from the anesthesia or the breathing tube placed in your throat during surgery. If this causes discomfort, gargle with warm salt water. The discomfort should disappear within 24 hours.  If you had a scopolamine patch placed behind your ear for the management of post- operative nausea and/or vomiting:  1. The medication in the patch is effective for 72 hours, after which it should  be removed.  Wrap patch in a tissue and discard in the trash. Wash hands thoroughly with soap and water. 2. You may remove the patch earlier than 72 hours if you experience unpleasant side effects which may include dry mouth, dizziness or visual disturbances. 3. Avoid touching the patch. Wash your hands with  soap and water after contact with the patch.

## 2016-03-03 NOTE — Anesthesia Postprocedure Evaluation (Signed)
Anesthesia Post Note  Patient: William [name]  Procedure(s) Performed: Procedure(s) (LRB): EXCISION OF BACK  MASS (N/A)  Patient location during evaluation: PACU Anesthesia Type: MAC Level of consciousness: awake and alert Pain management: pain level controlled Vital Signs Assessment: post-procedure vital signs reviewed and stable Respiratory status: spontaneous breathing, nonlabored ventilation and respiratory function stable Cardiovascular status: stable and blood pressure returned to baseline Anesthetic complications: no    Last Vitals:  Vitals:   03/03/16 1200 03/03/16 1204  BP: (!) 141/69   Pulse: 66 68  Resp: 20 18  Temp:      Last Pain:  Vitals:   03/03/16 1200  TempSrc:   PainSc: 0-No pain                 Nilda Simmer

## 2016-03-03 NOTE — Transfer of Care (Signed)
Immediate Anesthesia Transfer of Care Note  Patient: William [name]  Procedure(s) Performed: Procedure(s): EXCISION OF BACK  MASS (N/A)  Patient Location: PACU  Anesthesia Type:MAC  Level of Consciousness: awake, alert , oriented and patient cooperative  Airway & Oxygen Therapy: Patient Spontanous Breathing and Patient connected to face mask oxygen  Post-op Assessment: Report given to RN and Post -op Vital signs reviewed and stable  Post vital signs: Reviewed and stable  Last Vitals:  Vitals:   03/03/16 0919  BP: (!) 148/79  Pulse: 64  Resp: 20  Temp: 36.9 C    Last Pain:  Vitals:   03/03/16 0919  TempSrc: Oral         Complications: No apparent anesthesia complications

## 2016-03-03 NOTE — H&P (Signed)
William [name] 02/09/2016 9:21 AM Location: [facility] Patient #: [id number] DOB: [birth year] Married / Language: English / Race: White Male   History of Present Illness Stark Klein MD; 02/09/2016 10:24 AM) The patient is a 75 year old male who presents with skin lesions. The patient is a 75 year old male who I saw several months ago with a recurrent infection of a sebaceous cyst. He has had multiple procedures to drain a lesion on his back. It is no longer infected and he desires excision of the entire area in order to avoid recurrent infection again. He denies any recent drainage, pain, or redness.   Allergies (Sonya Bynum, CMA; 02/09/2016 9:21 AM) Latex STRAWBERRY  Medication History (Sonya Bynum, CMA; 02/09/2016 9:21 AM) Eszopiclone (3MG  Tablet, Oral) Active. LORazepam (0.5MG  Tablet, Oral) Active. TraMADol HCl (50MG  Tablet, Oral) Active. Zolpidem Tartrate (10MG  Tablet, Oral) Active. Cephalexin (500MG  Capsule, Oral) Active. Fenofibrate (160MG  Tablet, Oral) Active. Lisinopril (20MG  Tablet, Oral) Active. NexIUM (40MG  Capsule DR, Oral) Active. Rosuvastatin Calcium (40MG  Tablet, Oral) Active. Tamsulosin HCl (0.4MG  Capsule, Oral) Active. TEGretol-XR (100MG  Tablet ER 12HR, Oral) Active. Aspirin (325MG  Tablet, Oral) Active. Vitamin D (Cholecalciferol) (1000UNIT Capsule, Oral) Active. Multivitamin Adult (Oral) Active. B Complex (Oral) Active. Medications Reconciled    Review of Systems Stark Klein MD; 02/09/2016 10:24 AM) All other systems negative  Vitals (Sonya Bynum CMA; 02/09/2016 9:21 AM) 02/09/2016 9:21 AM Weight: 229 lb Height: 71in Body Surface Area: 2.23 m Body Mass Index: 31.94 kg/m  Temp.: 44F(Temporal)  Pulse: 81 (Regular)  BP: 128/76 (Sitting, Left Arm, Standard)       Physical Exam Stark Klein MD; 02/09/2016 10:25 AM) General Mental Status-Alert. General Appearance-Consistent with stated  age. Hydration-Well hydrated. Voice-Normal.  Integumentary Note: firmness at prior incision on upper mid back. no redness, tenderness, or drainage.   Chest and Lung Exam Chest and lung exam reveals -quiet, even and easy respiratory effort with no use of accessory muscles. Inspection Chest Wall - Normal. Back - normal.    Assessment & Plan Stark Klein MD; 02/09/2016 10:28 AM) MASS OF SKIN OF BACK (R22.2) Impression: Patient would like to have this area excised in order to avoid additional need for incision and drainage. We discussed pros and cons of an office procedure versus operating room. Patient is on aspirin for prior stroke and placed to hold it for a long time. I think he will do better having this done in the operating room .  I discussed risks of surgery including bleeding and infection, wound breakdown.  He understands and wishes to proceed. Current Plans You are being scheduled for surgery - Our schedulers will call you.  You should hear from our office's scheduling department within 5 working days about the location, date, and time of surgery. We try to make accommodations for patient's preferences in scheduling surgery, but sometimes the OR schedule or the surgeon's schedule prevents [place] from making those accommodations.  If you have not heard from our office [phone]) in 5 working days, call the office and ask for your surgeon's nurse.  If you have other questions about your diagnosis, plan, or surgery, call the office and ask for your surgeon's nurse.    Signed by Stark Klein, MD (02/09/2016 10:28 AM)

## 2016-03-03 NOTE — Op Note (Signed)
PRE-OPERATIVE DIAGNOSIS: back mass 1 cm subcutaneous  POST-OPERATIVE DIAGNOSIS:  Same  PROCEDURE:  Procedure(s): Excision of back mass  SURGEON:  Surgeon(s): Stark Klein, MD  ANESTHESIA:   local and IV sedation  DRAINS: none   LOCAL MEDICATIONS USED:  BUPIVICAINE  and XYLOCAINE   SPECIMEN:  Source of Specimen:  back mass  DISPOSITION OF SPECIMEN:  PATHOLOGY  COUNTS:  YES  DICTATION: .Dragon Dictation  PLAN OF CARE: Discharge to home after PACU  PATIENT DISPOSITION:  PACU - hemodynamically stable.  FINDINGS:  C/w sebaceous cyst  EBL: min  PROCEDURE:  Pt was identified in the holding area and taken to the OR where he was placed in a lateral position with appropriate padding.  MAC anesthesia was induced.  The back was prepped and draped in sterile fashion.  A timeout was performed according to the surgical safety checklist.  When all was correct, we continued.    Local anesthetic was administered around the mass and prior incision from I&D.  The prior incision was resected with an ellipse of tissue.  The dissection continued around the mass with cautery.  Care was taken not to enter the mass. Hemostasis was achieved with cautery.    The wound was closed with 3-0 vicryl deep dermal interrupted suture and 4-0 monocryl running subcuticular suture.  The wound was cleaned, dried, and dressed with benzoin, steristrips, gauze, and tegaderm.    The patient was allowed to emerge from anesthesia and taken to the PACU in stable condition.  Needle, sponge, and instrument counts were correct x 2.

## 2016-03-04 ENCOUNTER — Encounter (HOSPITAL_BASED_OUTPATIENT_CLINIC_OR_DEPARTMENT_OTHER): Payer: Self-pay | Admitting: General Surgery

## 2016-03-10 ENCOUNTER — Encounter: Payer: Self-pay | Admitting: Medical

## 2016-03-10 ENCOUNTER — Ambulatory Visit (INDEPENDENT_AMBULATORY_CARE_PROVIDER_SITE_OTHER): Payer: PPO | Admitting: Medical

## 2016-03-10 VITALS — BP 132/74 | HR 66 | Temp 98.3°F | Ht 71.0 in | Wt 232.4 lb

## 2016-03-10 DIAGNOSIS — J029 Acute pharyngitis, unspecified: Secondary | ICD-10-CM | POA: Diagnosis not present

## 2016-03-10 DIAGNOSIS — J3489 Other specified disorders of nose and nasal sinuses: Secondary | ICD-10-CM | POA: Diagnosis not present

## 2016-03-10 DIAGNOSIS — G3183 Dementia with Lewy bodies: Secondary | ICD-10-CM | POA: Diagnosis not present

## 2016-03-10 DIAGNOSIS — J020 Streptococcal pharyngitis: Secondary | ICD-10-CM | POA: Diagnosis not present

## 2016-03-10 DIAGNOSIS — Z9181 History of falling: Secondary | ICD-10-CM

## 2016-03-10 DIAGNOSIS — F0280 Dementia in other diseases classified elsewhere without behavioral disturbance: Secondary | ICD-10-CM

## 2016-03-10 DIAGNOSIS — Z8673 Personal history of transient ischemic attack (TIA), and cerebral infarction without residual deficits: Secondary | ICD-10-CM

## 2016-03-10 LAB — POCT RAPID STREP A (OFFICE): Rapid Strep A Screen: POSITIVE — AB

## 2016-03-10 MED ORDER — BENZONATATE 100 MG PO CAPS: 100.0000 mg | ORAL_CAPSULE | Freq: Three times a day (TID) | ORAL | 0 refills | Status: DC | PRN

## 2016-03-10 MED ORDER — AMOXICILLIN 875 MG PO TABS: 875.0000 mg | ORAL_TABLET | Freq: Two times a day (BID) | ORAL | 0 refills | Status: DC

## 2016-03-10 MED ORDER — FLUTICASONE PROPIONATE 50 MCG/ACT NA SUSP: 2.0000 | Freq: Every day | NASAL | 1 refills | Status: DC

## 2016-03-10 MED FILL — AMOXICILLIN 875 MG TABLET: 875 | 10 days supply | Qty: 20 | Fill #0

## 2016-03-10 MED FILL — BENZONATATE 100 MG CAPSULE: 100 | 7 days supply | Qty: 21 | Fill #0

## 2016-03-10 MED FILL — FLUTICASONE PROP 50 MCG SPR: 50 | 30 days supply | Qty: 16 | Fill #0

## 2016-03-10 NOTE — Patient Instructions (Addendum)
For your strep throat will prescribe amoxicillin antibiotic.  For your sinus pressure and possible allergies rx flonase.  For cough rx benzonatate.  You have faint sore neck today likely associated with strep. If you get stiff neck or ha despite above treatment notify us.   I will put in [facility] referral for days wife having surgery/post op. (Oct 25-26, 2017).  Follow up 7 days or as needed.  Note when over current illness/feeling better next week please call to have nurse visit/flu vaccine.

## 2016-03-10 NOTE — Progress Notes (Signed)
Pre visit review using our clinic tool,if applicable. No additional management support is needed unless otherwise documented below in the visit note.  

## 2016-03-10 NOTE — Progress Notes (Signed)
Subjective:    Patient ID: William [name], male    DOB: 01/26/1941, 75 y.o.   MRN: [id number]  HPI   Pt in with [facility] for 2 days. No fever, no chills or sweats. Pt has a lot of runny nose. Pt feels some sinus pressure. No ear pain today(some ear pain yesterday).   No chest congestion. No wheezing.  Neck is sore little bit.   Mild cough. Dry/not productive.  Pt wife is getting surgery on Oct 25-26. Wife will not be in home. Wife requesting home health for those days.   Review of Systems  Constitutional: Negative for chills, fatigue and fever.  HENT: Positive for congestion, rhinorrhea and sore throat. Negative for drooling, ear pain, nosebleeds, tinnitus and trouble swallowing.   Respiratory: Positive for cough. Negative for choking, shortness of breath and wheezing.        Cough is mild.  Cardiovascular: Negative for chest pain and palpitations.  Gastrointestinal: Negative for abdominal pain.  Genitourinary: Negative for dysuria, flank pain, frequency, penile pain, testicular pain and urgency.  Musculoskeletal: Negative for back pain, gait problem and myalgias.  Skin: Negative for rash.  Neurological: Negative for dizziness, seizures, light-headedness and headaches.  Hematological: Negative for adenopathy. Does not bruise/bleed easily.  Psychiatric/Behavioral: Negative for agitation, confusion, dysphoric mood, self-injury and suicidal ideas. The patient is not nervous/anxious.     Past Medical History:  Diagnosis Date  . ANXIETY   . Arthritis    low back  . Bladder stone   . BPH (benign prostatic hypertrophy)   . Carotid artery occlusion   . 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 CVD (cerebrovascular disease)   . Eczema   . GERD (gastroesophageal reflux disease)   . History of carotid artery stenosis    S/P BILATERAL CEA  . 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 Hyperlipidemia   . Hypertension   . Nocturia   . S/P  carotid endarterectomy    BILATERAL ICA--  PATENT PER DUPLEX  05-19-2012  . Stroke [facility]) 1996   pt states no weaknes but has severe anxiety  . Urinary frequency   . Vitamin D deficiency      Social History   Social History  . Marital status: Married    Spouse name: N/A  . Number of children: 2  . Years of education: N/A   Occupational History  .  Retired   Social History Main Topics  . Smoking status: Former Smoker    Packs/day: 2.00    Years: 40.00    Types: Cigarettes    Quit date: 02/15/2005  . Smokeless tobacco: Never Used  . Alcohol use 0.0 oz/week     Comment: Occasional  . Drug use: No  . Sexual activity: Yes    Partners: Female   Other Topics Concern  . Not on file   Social History Narrative   Exercise--  Walks dogs everyday    Past Surgical History:  Procedure Laterality Date  . APPENDECTOMY  AS CHILD  . CARDIOVASCULAR STRESS TEST  03-27-2012  DR CRENSHAW   LOW RISK LEXISCAN STUDY-- PROBABLE NORMAL PERFUSION AND SOFT TISSUE ATTENUATION/  NO ISCHEMIA/ EF 51%  . CAROTID ENDARTERECTOMY Bilateral LEFT  11-12-2008  DR GREG HAYES   RIGHT ICA  2006  ([facility])  . CYSTOSCOPY WITH LITHOLAPAXY N/A 02/26/2013   Procedure: CYSTOSCOPY WITH LITHOLAPAXY;  Surgeon: Franchot Gallo, MD;  Location: [facility];  Service: Urology;  Laterality: N/A;  . EYE SURGERY  Jan. 2016   cataract surgery both eyes  . INGUINAL HERNIA REPAIR Right 11-08-2006  . MASS EXCISION N/A 03/03/2016   Procedure: EXCISION OF BACK  MASS;  Surgeon: Stark Klein, MD;  Location: [facility];  Service: General;  Laterality: N/A;  . 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 PROSTATE SURGERY    . TRANSURETHRAL RESECTION OF PROSTATE N/A 02/26/2013   Procedure: TRANSURETHRAL RESECTION OF THE PROSTATE WITH GYRUS INSTRUMENTS;  Surgeon: Franchot Gallo, MD;  Location: [facility];  Service: Urology;  Laterality: N/A;    Family History  Problem  Relation Age of Onset  . Heart disease Mother     CHF  . Bipolar disorder Mother   . Heart disease Father     CHF    Allergies  Allergen Reactions  . Adhesive [Tape] Other (See Comments)    BLISTER  . Strawberry (Diagnostic) Hives    Current Outpatient Prescriptions on File Prior to Visit  Medication Sig Dispense Refill  . aspirin 325 MG EC tablet Take 325 mg by mouth daily.    . B Complex Vitamins (VITAMIN B COMPLEX PO) Take by mouth daily.      . Calcium Carb-Cholecalciferol (CALCIUM 1000 + D PO) Take by mouth daily.      . carbamazepine (TEGRETOL-XR) 100 MG 12 hr tablet 1 qam  1  qhs 60 tablet 8  . Cholecalciferol (VITAMIN D) 2000 UNITS CAPS Take by mouth.    . Eszopiclone 3 MG TABS TAKE 1 TABLET BY MOUTH AT BEDTIME AS NEEDED TAKE IMMEDIATELY BEFORE BEDTIME 30 tablet 1  . fenofibrate 160 MG tablet Take 1 tablet (160 mg total) by mouth daily. REPEAT LABS ARE DUE NOW 90 tablet 0  . Flaxseed, Linseed, (FLAX SEED OIL PO) Take by mouth daily.      [name] levocetirizine (XYZAL) 5 MG tablet Take 1 tablet (5 mg total) by mouth every evening. 30 tablet 5  . lisinopril (PRINIVIL,ZESTRIL) 20 MG tablet Take 1 tablet (20 mg total) by mouth daily. 90 tablet 3  . LORazepam (ATIVAN) 0.5 MG tablet 2 bid  1  qday  prn 150 tablet 5  . Multiple Vitamin (MULTIVITAMIN) tablet Take 1 tablet by mouth daily.    [name] NEXIUM 40 MG capsule TAKE 1 CAPSULE BY MOUTH AS NEEDED 90 capsule 0  . rosuvastatin (CRESTOR) 40 MG tablet Take 1 tablet (40 mg total) by mouth daily. 90 tablet 3  . tamsulosin (FLOMAX) 0.4 MG CAPS capsule 1 po qd 30 capsule    No current facility-administered medications on file prior to visit.     BP 132/74   Pulse 66   Temp 98.3 F (36.8 C) (Oral)   Ht 5\' 11"  (1.803 m)   Wt 232 lb 6.4 oz (105.4 kg)   SpO2 98%   BMI 32.41 kg/m       Objective:   Physical Exam  General  Mental Status - Alert. General Appearance - Well groomed. Not in acute distress. Appears to process questions  slowly at times.  Skin Rashes- No Rashes.  HEENT Head- Normal. Ear Auditory Canal - Left- Normal. Right - Normal.Tympanic Membrane- Left- Normal. Right- Normal. Eye Sclera/Conjunctiva- Left- Normal. Right- Normal. Nose & Sinuses Nasal Mucosa- Left-  Boggy and Congested. Right-  Boggy and  Congested.Bilateral  Faint/mild  maxillary and  Faint/ mild frontal sinus pressure. Mouth & Throat Lips: Upper Lip-  Normal: no dryness, cracking, pallor, cyanosis, or vesicular eruption. Lower Lip-Normal: no dryness, cracking, pallor, cyanosis or vesicular eruption. Buccal Mucosa- Bilateral- No Aphthous ulcers. Oropharynx- No Discharge or Erythema. Tonsils: Characteristics- Bilateral- moderate  Erythema and  Congestion. Size/Enlargement- Bilateral- No enlargement. Discharge- bilateral-None.  Neck Neck- Supple. No Masses. Good rom. No rigidity.   Chest and Lung Exam Auscultation: Breath Sounds:-Clear even and unlabored.  Cardiovascular Auscultation:Rythm- Regular, rate and rhythm. Murmurs & Other Heart Sounds:Ausculatation of the heart reveal- No Murmurs.  Lymphatic Head & Neck General Head & Neck Lymphatics: Bilateral: Description- No Localized lymphadenopathy. But faint tender submandular region  Neurologic- CN III-XII grossly intact.        Assessment & Plan:  For your strep throat will prescribe amoxicillin antibiotic.  For your sinus pressure and possible allergies rx flonase.  For cough rx benzonatate.  You have faint sore neck today likely associated with strep. If you get stiff neck or ha despite above treatment notify us.   I will put in [facility] referral for days wife having surgery/post op. (Oct 25-26, 2017). With pt hx of dementia, hx of stroke, htn and hyperlipidemia. I do think [facility] would be beneficial for those day. Maybe more.  Follow up 7 days or as needed.  Note when over current illness/feeling better next week please call to have nurse visit/flu vaccine.   Candy Leverett,  Percell Miller, PA-C

## 2016-03-12 ENCOUNTER — Encounter: Payer: Self-pay | Admitting: Medical

## 2016-03-12 NOTE — Telephone Encounter (Signed)
Did call back pt after hours. I advised again likely best to be seen in ED this weekend now or maybe tomorrow am. Explained his  ha is concern based on his history. Wife expressed understanding and states she watch him closely and take him to ED if needed.

## 2016-03-12 NOTE — Telephone Encounter (Signed)
Pt called in to follow up on message. She says that it is showing in My Chart that she has a new message but she is unable to see anything. She would like to speak with or received some advise from provider in regards to pt still not feeling well. Please advise by phone.     Thanks.

## 2016-03-12 NOTE — Telephone Encounter (Signed)
Tried to talk with pt or wife directly. Called mobile. Had to leave a message. Explained saw my chart message. And some concern that he is not better(neck pain and ha now present). Some concern siince he had + strep test and still symptomatic despite amoxicillin.   Called between pt. Explained will see some more patients and then try to call her back. But did explain probably best for him to be evaluated uc or ED tonight or tomorrow just depending on how he feels. Best course of action as we approach the weekend.

## 2016-03-15 ENCOUNTER — Telehealth: Payer: Self-pay | Admitting: Family Medicine

## 2016-03-15 ENCOUNTER — Encounter: Payer: Self-pay | Admitting: Medical

## 2016-03-15 ENCOUNTER — Ambulatory Visit (INDEPENDENT_AMBULATORY_CARE_PROVIDER_SITE_OTHER): Payer: PPO | Admitting: Medical

## 2016-03-15 VITALS — BP 112/80 | HR 71 | Temp 98.2°F | Ht 71.0 in | Wt 231.2 lb

## 2016-03-15 DIAGNOSIS — M791 Myalgia, unspecified site: Secondary | ICD-10-CM

## 2016-03-15 DIAGNOSIS — J028 Acute pharyngitis due to other specified organisms: Secondary | ICD-10-CM

## 2016-03-15 DIAGNOSIS — J029 Acute pharyngitis, unspecified: Secondary | ICD-10-CM | POA: Diagnosis not present

## 2016-03-15 LAB — POCT RAPID STREP A (OFFICE): Rapid Strep A Screen: POSITIVE — AB

## 2016-03-15 LAB — POCT RAPID INFLUENZA A&B

## 2016-03-15 MED ORDER — PENICILLIN G BENZATHINE 600000 UNIT/ML IM SUSP: 1200000.0000 [IU] | Freq: Once | INTRAMUSCULAR | 0 refills | Status: AC

## 2016-03-15 MED ORDER — CLINDAMYCIN HCL 300 MG PO CAPS: 300.0000 mg | ORAL_CAPSULE | Freq: Three times a day (TID) | ORAL | 0 refills | Status: DC

## 2016-03-15 MED ORDER — LEVOCETIRIZINE DIHYDROCHLORIDE 5 MG PO TABS: 5.0000 mg | ORAL_TABLET | Freq: Every evening | ORAL | 5 refills | Status: DC

## 2016-03-15 MED FILL — LEVOCETIRIZINE 5 MG TABLET: 5 | 30 days supply | Qty: 30 | Fill #1

## 2016-03-15 MED FILL — CLINDAMYCIN HCL 300 MG CAPS: 300 | 7 days supply | Qty: 21 | Fill #0

## 2016-03-15 NOTE — Telephone Encounter (Signed)
Patient's spouse called stating that the patient was prescribed antibiotics for strep throat last week and she doesn't think they are working. He is still having headaches and his throat is still sore. She would like to know if he needs to come back in or if he could get a different prescription? Please Advise.  Caller: Patient Relation: Spouse Patient Phone: [phone] Pharmacy: [place], [place] [address]

## 2016-03-15 NOTE — Telephone Encounter (Signed)
Patient came in office for appointment today.

## 2016-03-15 NOTE — Telephone Encounter (Signed)
Spouse returning call best # [phone] and only #

## 2016-03-15 NOTE — Telephone Encounter (Signed)
Please call pt and have him come in this afternoon.

## 2016-03-15 NOTE — Progress Notes (Signed)
Pre visit review using our clinic tool,if applicable. No additional management support is needed unless otherwise documented below in the visit note.  

## 2016-03-15 NOTE — Telephone Encounter (Signed)
Routing to [facility].     KP

## 2016-03-15 NOTE — Progress Notes (Signed)
Subjective:    Patient ID: William [name], male    DOB: 01-21-1941, 75 y.o.   MRN: [id number]  HPI  Pt still has sore throat moderate despite antibiotic. The amoxicillin just is not working per pt and wife. Clear mucous when he clears his throat. Moderate pain on swallowing.  Some headache is gone. Went away with tylenol.   Some neck soreness. But not stiff.  Faint upper back soreness. Mid back.  Pt get faint ha. But not now. Will go away with tylenol.  No gross motor or sensory function deficits reported.    Review of Systems  Constitutional: Negative for chills, fatigue and fever.  HENT: Positive for postnasal drip and sore throat. Negative for congestion, drooling, ear pain, sinus pressure and sneezing.   Respiratory: Negative for cough, chest tightness, shortness of breath and wheezing.   Cardiovascular: Negative for chest pain and palpitations.  Gastrointestinal: Negative for abdominal pain.  Genitourinary: Negative for difficulty urinating, flank pain and frequency.  Musculoskeletal: Negative for back pain.       Mild upper back myaglia tspine region.  Skin: Negative for rash.  Psychiatric/Behavioral: Negative for behavioral problems and confusion.    Past Medical History:  Diagnosis Date  . ANXIETY   . Arthritis    low back  . Bladder stone   . BPH (benign prostatic hypertrophy)   . Carotid artery occlusion   . 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 CVD (cerebrovascular disease)   . Eczema   . GERD (gastroesophageal reflux disease)   . History of carotid artery stenosis    S/P BILATERAL CEA  . 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 Hyperlipidemia   . Hypertension   . Nocturia   . S/P carotid endarterectomy    BILATERAL ICA--  PATENT PER DUPLEX  05-19-2012  . Stroke [facility]) 1996   pt states no weaknes but has severe anxiety  . Urinary frequency   . Vitamin D deficiency      Social History   Social History    . Marital status: Married    Spouse name: N/A  . Number of children: 2  . Years of education: N/A   Occupational History  .  Retired   Social History Main Topics  . Smoking status: Former Smoker    Packs/day: 2.00    Years: 40.00    Types: Cigarettes    Quit date: 02/15/2005  . Smokeless tobacco: Never Used  . Alcohol use 0.0 oz/week     Comment: Occasional  . Drug use: No  . Sexual activity: Yes    Partners: Female   Other Topics Concern  . Not on file   Social History Narrative   Exercise--  Walks dogs everyday    Past Surgical History:  Procedure Laterality Date  . APPENDECTOMY  AS CHILD  . CARDIOVASCULAR STRESS TEST  03-27-2012  DR CRENSHAW   LOW RISK LEXISCAN STUDY-- PROBABLE NORMAL PERFUSION AND SOFT TISSUE ATTENUATION/  NO ISCHEMIA/ EF 51%  . CAROTID ENDARTERECTOMY Bilateral LEFT  11-12-2008  DR GREG HAYES   RIGHT ICA  2006  ([facility])  . CYSTOSCOPY WITH LITHOLAPAXY N/A 02/26/2013   Procedure: CYSTOSCOPY WITH LITHOLAPAXY;  Surgeon: Franchot Gallo, MD;  Location: [facility];  Service: Urology;  Laterality: N/A;  . EYE SURGERY  Jan. 2016   cataract surgery both eyes  . INGUINAL HERNIA REPAIR Right 11-08-2006  . MASS EXCISION N/A 03/03/2016  Procedure: EXCISION OF BACK  MASS;  Surgeon: Stark Klein, MD;  Location: [facility];  Service: General;  Laterality: N/A;  . 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 PROSTATE SURGERY    . TRANSURETHRAL RESECTION OF PROSTATE N/A 02/26/2013   Procedure: TRANSURETHRAL RESECTION OF THE PROSTATE WITH GYRUS INSTRUMENTS;  Surgeon: Franchot Gallo, MD;  Location: [facility];  Service: Urology;  Laterality: N/A;    Family History  Problem Relation Age of Onset  . Heart disease Mother     CHF  . Bipolar disorder Mother   . Heart disease Father     CHF    Allergies  Allergen Reactions  . Adhesive [Tape] Other (See Comments)    BLISTER  . Strawberry (Diagnostic)  Hives    Current Outpatient Prescriptions on File Prior to Visit  Medication Sig Dispense Refill  . amoxicillin (AMOXIL) 875 MG tablet Take 1 tablet (875 mg total) by mouth 2 (two) times daily. 20 tablet 0  . aspirin 325 MG EC tablet Take 325 mg by mouth daily.    . B Complex Vitamins (VITAMIN B COMPLEX PO) Take by mouth daily.      . benzonatate (TESSALON) 100 MG capsule Take 1 capsule (100 mg total) by mouth 3 (three) times daily as needed for cough. 21 capsule 0  . Calcium Carb-Cholecalciferol (CALCIUM 1000 + D PO) Take by mouth daily.      . carbamazepine (TEGRETOL-XR) 100 MG 12 hr tablet 1 qam  1  qhs 60 tablet 8  . Cholecalciferol (VITAMIN D) 2000 UNITS CAPS Take by mouth.    . Eszopiclone 3 MG TABS TAKE 1 TABLET BY MOUTH AT BEDTIME AS NEEDED TAKE IMMEDIATELY BEFORE BEDTIME 30 tablet 1  . fenofibrate 160 MG tablet Take 1 tablet (160 mg total) by mouth daily. REPEAT LABS ARE DUE NOW 90 tablet 0  . Flaxseed, Linseed, (FLAX SEED OIL PO) Take by mouth daily.      . fluticasone (FLONASE) 50 MCG/ACT nasal spray Place 2 sprays into both nostrils daily. 16 g 1  . lisinopril (PRINIVIL,ZESTRIL) 20 MG tablet Take 1 tablet (20 mg total) by mouth daily. 90 tablet 3  . LORazepam (ATIVAN) 0.5 MG tablet 2 bid  1  qday  prn 150 tablet 5  . Multiple Vitamin (MULTIVITAMIN) tablet Take 1 tablet by mouth daily.    [name] NEXIUM 40 MG capsule TAKE 1 CAPSULE BY MOUTH AS NEEDED 90 capsule 0  . rosuvastatin (CRESTOR) 40 MG tablet Take 1 tablet (40 mg total) by mouth daily. 90 tablet 3  . tamsulosin (FLOMAX) 0.4 MG CAPS capsule 1 po qd 30 capsule   . levocetirizine (XYZAL) 5 MG tablet Take 1 tablet (5 mg total) by mouth every evening. (Patient not taking: Reported on 03/15/2016) 30 tablet 5   No current facility-administered medications on file prior to visit.     BP 112/80   Pulse 71   Temp 98.2 F (36.8 C) (Oral)   Ht 5\' 11"  (1.803 m)   Wt 231 lb 3.2 oz (104.9 kg)   SpO2 96%   BMI 32.25 kg/m         Objective:   Physical Exam  General  Mental Status - Alert. General Appearance - Well groomed. Not in acute distress.  Skin Rashes- No Rashes.  HEENT Head- Normal. Ear Auditory Canal - Left- Normal. Right - Normal.Tympanic Membrane- Left- Normal. Right- Normal. Eye Sclera/Conjunctiva- Left- Normal. Right- Normal.  Nose & Sinuses Nasal Mucosa- Left-  Boggy and Congested. Right-  Boggy and  Congested.Bilateral maxillary and frontal sinus pressure. Mouth & Throat Lips: Upper Lip- Normal: no dryness, cracking, pallor, cyanosis, or vesicular eruption. Lower Lip-Normal: no dryness, cracking, pallor, cyanosis or vesicular eruption. Buccal Mucosa- Bilateral- No Aphthous ulcers. Oropharynx- No Discharge or Erythema. Tonsils: Characteristics- Bilateral- faint  Erythema(less than before). Size/Enlargement- Bilateral- No enlargement. Discharge- bilateral-None.  Neck Neck- Supple. No Masses. Demonstrates good range of motion. No stiffness of neck.   Chest and Lung Exam Auscultation: Breath Sounds:-Clear even and unlabored.  Cardiovascular Auscultation:Rythm- Regular, rate and rhythm. Murmurs & Other Heart Sounds:Ausculatation of the heart reveal- No Murmurs.  Lymphatic Head & Neck General Head & Neck Lymphatics: Bilateral: Description- No Localized lymphadenopathy.  Back- faint upper back tspine region discomfort on palpation.  Neuro- CN III- XII grossly intact.       Assessment & Plan:  Your strep throat is persisting by rapid testing. So we gave you bicillin injection today and will rx clindamycin oral antibiotic. I do want you to make sure you take probiotics while on clindamycin.  Your rapid flu test was negative.  If you get severe ha with neck stiffness then ED evaluation. ED evaluation for  ha with neurologic type signs and symptoms as well.  Follow up in 5-7 days or as needed.  Discussed resistance to pcn. Clindamycin choice with pharmacist and dosing.

## 2016-03-15 NOTE — Patient Instructions (Addendum)
Your strep throat is persisting by rapid testing. So we gave you bicillin injection today and will rx clindamycin oral antibiotic. I do want you to make sure you take probiotics while on clindamycin.  Your rapid flu test was negative.  If you get severe ha with neck stiffness then ED evaluation. ED evaluation for  ha with neurologic type signs and symptoms as well.  For your pnd I am refilling your xyzal.  Follow up in 5-7 days or as needed

## 2016-03-16 ENCOUNTER — Ambulatory Visit: Admit: 2016-03-16 | Discharge: 2016-03-17 | Attending: Hematology & Oncology | Primary: Internal Medicine

## 2016-03-16 ENCOUNTER — Inpatient Hospital Stay: Admit: 2016-03-16 | Discharge: 2016-03-17 | Primary: Internal Medicine

## 2016-03-16 DIAGNOSIS — N184 Chronic kidney disease, stage 4 (severe): Principal | ICD-10-CM

## 2016-03-16 DIAGNOSIS — I639 Cerebral infarction, unspecified: Secondary | ICD-10-CM

## 2016-03-16 DIAGNOSIS — I509 Heart failure, unspecified: Principal | ICD-10-CM

## 2016-03-16 DIAGNOSIS — I10 Essential (primary) hypertension: Secondary | ICD-10-CM

## 2016-03-16 DIAGNOSIS — D631 Anemia in chronic kidney disease: Secondary | ICD-10-CM

## 2016-03-16 DIAGNOSIS — I4891 Unspecified atrial fibrillation: Secondary | ICD-10-CM

## 2016-03-16 DIAGNOSIS — F1010 Alcohol abuse, uncomplicated: Secondary | ICD-10-CM

## 2016-03-16 DIAGNOSIS — I6350 Cerebral infarction due to unspecified occlusion or stenosis of unspecified cerebral artery: Secondary | ICD-10-CM

## 2016-03-16 DIAGNOSIS — E119 Type 2 diabetes mellitus without complications: Secondary | ICD-10-CM

## 2016-03-16 DIAGNOSIS — D696 Thrombocytopenia, unspecified: Secondary | ICD-10-CM

## 2016-03-16 MED ORDER — LISINOPRIL 10 MG PO TABS: Start: 2016-03-16 — End: 2016-07-02

## 2016-03-16 MED ORDER — EPOETIN ALFA 20000 UNIT/ML IJ SOLN
20000 [IU] | Freq: Once | SUBCUTANEOUS | Status: CP
Start: 2016-03-16 — End: ?

## 2016-03-16 MED ORDER — EPOETIN ALFA 20000 UNIT/ML IJ SOLN
20000 [IU] | Freq: Once | SUBCUTANEOUS | Status: CN
Start: 2016-03-16 — End: ?

## 2016-03-16 NOTE — Progress Notes
B12 deficiency, folate deficiency, or hemolysis. The bone marrow biopsy did not show evidence of leukemia, lymphoma, myelodysplasia, or myelofibrosis. The changes noted in the bone marrow were felt consistent with a reactive process. 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  The patient completed a course of parenteral iron infusions with a significant improvement in the hematologic counts and iron indices.  I have again strongly encourage the patient to return to his gastroenterologist for an evaluation. I have discussed with the patient the importance of undergoing an evaluation for GI bleeding sources including the possibility of malignancy.  The patient verbalized an understanding of our discussion and states that he will make the appropriate arrangements through the [facility].    Recommendation/Plan:  1. We will continue?Procrit 20,000 units subcutaneous every 2 weeks.  2. The patient was strongly encouraged to return to his?gastroenterologist?for an evaluation.  3. The patient will return for an exam, CBC, BMP, ferritin, iron profile, B12 level, folate level in 8 weeks.  4. As always, Jeffery Gonzales understands that he may return to the office sooner than the scheduled appointment if any questions or concerns arise during the interim.    Return to Clinic: Return in about 8 weeks (around 05/11/2016).

## 2016-03-16 NOTE — Progress Notes
?    clopidogrel (PLAVIX) 75 MG tablet, Take 1 Tablet by mouth daily., Disp: 30 Tablet, Rfl: 12  ?  folic acid 1 MG Tablet, , Disp: , Rfl:   ?  furosemide (LASIX) 40 MG tablet, Take 1 Tablet by mouth 2 times daily., Disp: 30 Tablet, Rfl: 12  ?  lisinopril (PRINIVIL,ZESTRIL) 10 MG Tablet, , Disp: , Rfl:   ?  oxyCODONE-acetaminophen (PERCOCET) 10-325 MG Tablet, Take 10-325 tablets by mouth daily., Disp: , Rfl:   ?  pantoprazole (PROTONIX) 40 MG tablet, Take 1 Tablet by mouth 2 times daily (before meals)., Disp: 60 Tablet, Rfl: 2  ?  STOOL SOFTENER LAXATIVE DC 240 MG Capsule, Take 240 mg by mouth daily., Disp: , Rfl:   ?  VOLTAREN 1 % Gel topical gel, Apply 1 % N20 topically daily., Disp: , Rfl:       Labs:   Office Visit on 03/16/2016   Component Date Value   ? WBC 03/16/2016 5.53    ? RBC 03/16/2016 3.13    ? Hemoglobin 03/16/2016 9.2*   ? Hematocrit 03/16/2016 29.1*   ? MCV 03/16/2016 93.0    ? [place] 03/16/2016 29.4    ? MCHC 03/16/2016 31.6    ? RDW 03/16/2016 15.6    ? Platelet Count 03/16/2016 116*   ? MPV 03/16/2016 8.7    ? Neutrophils Absolute 03/16/2016 4.06    ? Neutrophils % 03/16/2016 73.4    ? Lymphocytes Absolute 03/16/2016 0.46    ? Lymphocytes % 03/16/2016 8.3*   ? Monocytes Absolute 03/16/2016 0.57    ? Monocytes % 03/16/2016 10.3    ? Eosinophils Absolute 03/16/2016 0.43    ? Eosinophils % 03/16/2016 7.8    ? Basophil Absolute 03/16/2016 0.01    ? BASOPHILS 03/16/2016 0.2    Hospital Outpatient Visit on 03/02/2016   Component Date Value   ? WBC 03/02/2016 4.71    ? RBC 03/02/2016 3.12    ? Hemoglobin 03/02/2016 9.1*   ? Hematocrit 03/02/2016 29.0*   ? MCV 03/02/2016 92.9    ? [place] 03/02/2016 29.2    ? MCHC 03/02/2016 31.4    ? RDW 03/02/2016 17.2    ? Platelet Count 03/02/2016 122*   ? MPV 03/02/2016 8.7    ? Neutrophils Absolute 03/02/2016 3.03    ? Neutrophils % 03/02/2016 64.4    ? Lymphocytes Absolute 03/02/2016 0.60    ? Lymphocytes % 03/02/2016 12.7*   ? Monocytes Absolute 03/02/2016 0.67

## 2016-03-16 NOTE — Patient Instructions
Pt to receive procrit today and every 2 weeks   F/U in 8 weeks with labs

## 2016-03-16 NOTE — Progress Notes
?   Monocytes % 03/02/2016 14.2    ? Eosinophils Absolute 03/02/2016 0.39    ? Eosinophils % 03/02/2016 8.3    ? Basophil Absolute 03/02/2016 0.02    ? BASOPHILS 03/02/2016 0.4    Hospital Outpatient Visit on 02/17/2016   Component Date Value   ? WBC 02/17/2016 3.55*   ? RBC 02/17/2016 3.16    ? Hemoglobin 02/17/2016 9.1*   ? Hematocrit 02/17/2016 29.3*   ? MCV 02/17/2016 92.7    ? [place] 02/17/2016 28.8    ? MCHC 02/17/2016 31.1    ? RDW 02/17/2016 18.4    ? Platelet Count 02/17/2016 109*   ? MPV 02/17/2016 8.8    ? Neutrophils Absolute 02/17/2016 2.47    ? Neutrophils % 02/17/2016 69.5    ? Lymphocytes Absolute 02/17/2016 0.45    ? Lymphocytes % 02/17/2016 12.7*   ? Monocytes Absolute 02/17/2016 0.43    ? Monocytes % 02/17/2016 12.1    ? Eosinophils Absolute 02/17/2016 0.19    ? Eosinophils % 02/17/2016 5.4    ? Basophil Absolute 02/17/2016 0.01    ? BASOPHILS 02/17/2016 0.3    Hospital Outpatient Visit on 01/30/2016   Component Date Value   ? Ferritin 01/30/2016 146.2    ? Sodium 01/30/2016 137    ? Potassium 01/30/2016 5.7*   ? Chloride 01/30/2016 105    ? CO2 01/30/2016 19*   ? Urea Nitrogen 01/30/2016 35*   ? Creatinine 01/30/2016 1.17    ? BUN/Creatinine Ratio 01/30/2016 29.9*   ? Glucose 01/30/2016 132*   ? Calcium 01/30/2016 8.3*   ? Anion Gap 01/30/2016 13    ? EGFR 01/30/2016 >59    ? Iron 01/30/2016 71    ? Transferrin 01/30/2016 219    ? TIBC 01/30/2016 278    ? Iron Saturation 01/30/2016 26    ? WBC 01/30/2016 3.39*   ? RBC 01/30/2016 3.27    ? Hemoglobin 01/30/2016 9.3*   ? Hematocrit 01/30/2016 30.3*   ? MCV 01/30/2016 92.7    ? [facility] 01/30/2016 28.4    ? MCHC 01/30/2016 30.7    ? RDW 01/30/2016 20.2    ? Platelet Count 01/30/2016 77*   ? MPV 01/30/2016 8.8    ? Neutrophils Absolute 01/30/2016 2.34    ? Neutrophils % 01/30/2016 69.0    ? Lymphocytes Absolute 01/30/2016 0.48    ? Lymphocytes % 01/30/2016 14.2*   ? Monocytes Absolute 01/30/2016 0.41    ? Monocytes % 01/30/2016 12.1

## 2016-03-16 NOTE — Progress Notes
?   Not on file     Social History Narrative       Family History:    Family History   Problem Relation Age of Onset   ? Diabetes Mother    ? Cancer Father        Vital Signs:   Vitals:    03/16/16 1153   BP: 132/63   Pulse: 80   Resp: 18   Temp: 37.2 ?C (99 ?F)   Weight: 102.5 kg (226 lb)   Height: 1.778 m (5\' 10" )       Allergies: No known drug allergy    Review of Systems:  Constitutional:?Weight loss-intentional  Fatigue-improved  No loss of appetite  No night sweats  No fever  No chills?  Weight loss due to diuresis  Eyes:?No blurred vision  No double vision  No difficulty seeing  Ears,Nose, Mouth,Throat: No hearing loss  No ringing in ears  No sinus trouble  No difficulty swallowing  No sore throat  No nasal drainage  No nose bleeds  No hoarseness  Cardiac:No heart papitations  No lightheadedness  Swelling in legs: much improved  Respiratory:No cough  No sputum production  No hemoptysis  No shortness of breath  No orthopnea  Gastrointestinal:No nausea  No vomiting  No heartburn  No constipation  No diarrhea  No abdominal pain  No rectal bleeding  No Bowel incontinence  Genitourinary:No burning on urination  No pain with urination  No blood in urine  No frequent urination  No urinary incontinence  Musculosketal:No muscle pain  No stiffness  No joint pain  No joint swelling  No back pain  Skin:No skin rash  Neurological:No seizures  No dizziness  No loss of balance  No weakness of limbs  No loss of sensation  Tingling sensations bilateral hands  No memory loss  No difficulty talking  Psychiatric:No nervousness  No depression  No restlessness  No difficulty sleeping  Hematologic/Lymphatic/Immunologic:No bleeding  No lumps in arm pits  No lumps in neck  No lumps in       Medications:   Current Outpatient Prescriptions:   ?  aspirin 325 MG Tablet Delayed Release, , Disp: , Rfl:   ?  atorvastatin (LIPITOR) 40 MG tablet, Take 1 Tablet by mouth nightly., Disp: 30 Tablet, Rfl: 12

## 2016-03-16 NOTE — Progress Notes
Patient Name: Daniell Tinder    Date: 03/16/2016    Date Of Birth: 1940/11/23    History of Present Illness:  Mr. Jerauld is a 76?year old Caucasian male who was referred to the hematology office for an evaluation of anemia. The patient has undergone an evaluation by Dr. Carolina Cellar at [facility] due to iron deficiency anemia. The patient has a history of a small bowel GI bleed in 2013 and a second episode in February 2015 most recently occurring in the setting of Xarelto use. The patient has required several transfusions in the past. The patient denies any recent evidence of bleeding specifically no bright red blood per rectum or black stools. He underwent a colonoscopy exam 10/17/2013 which revealed a 3 mm sessile polyp in the descending colon which was removed, a 3 mm sessile polyp in the descending colon which was removed, a diminutive sessile polyp in the sigmoid colon which was removed, severe diverticulosis and otherwise a normal exam. An upper endoscopy obtained 10/17/2013 revealed patchy erythema in the antrum of the stomach but otherwise a normal exam. A capsule endoscopy obtained 10/18/2013 revealed a small erythematous spot in the proximal-mi jejunum without stigmata for bleeding. The remainder of the small bowel exam was normal. As there were no overt bleeding sites identified on the GI evaluation the patient was referred to the hematology office for additional assessment of the anemia and to evaluate for a possible myelodysplasia.  ??  The patient has a significant history of coronary artery disease, diabetes, congestive heart failure, hypertension, hyperlipidemia. He states that he fell and required hip surgery in March 2015.  ??  The patient received a dose of Injectafer 06/18/2014 but did not return the following week to receive cycle 2. The patient states that he had been suffering from persistent pain in the hip and is being evaluated by

## 2016-03-16 NOTE — Progress Notes
?   Eosinophils Absolute 01/30/2016 0.16    ? Eosinophils % 01/30/2016 4.7    ? Basophil Absolute 01/30/2016 0.00    ? BASOPHILS 01/30/2016 0.0          Imaging Results:     Health Maintenance: Colonoscopy:2015.    Physical Exam:   General: Well developed, well nourished, No acute Distress, Gentleman patient and Elderly appearance  Head:Atraumatic and normocephalic  Eyes:PERRLA, EOMS intact, No jaundice, pallor and Conjunctiva clear  Ears, Nose, Throat, Neck, and Mouth: Trachea Midline, No JVD, No Lymphadenopathy, Thyroid midline-normal, Neck supple and No oral lesions  Cardiovascular:S1,S2 no murmurs and Regular heart beat  Chest:Symmetrical, No kyphosis and No scoliosis  Respiratory:Clear, No rales/Rhonchi  Gastrointestinal:Abdomen soft, Abdomen non-tender, Abdomen non-distended, Abdomen without masses, No ascites and No hepatosplenomegaly  Hematologic/Lymphatic:No petechiae, No purpura, No neck lymphadenopathy, No Axillary lymphadenopathy and No Groin lymphadenopathy  Musculoskeletal: ambulates with the assistance of a cane.  Extremities: trace edema BLE, No cyanosis, No digital clubbing and No discoloration, support stockings BLE  Neurologic:Alert and oriented, No Hemiparesis and No motor deficits    Assessment:  Jeffery Gonzales is a 75 year old male who was referred to the hematology office for an evaluation of anemia. This was most consistent with iron deficiency anemia. The patient completed a GI evaluation which did not reveal a specific source of bleeding. However, I suspect that the patient likely has had chronic GI blood loss resulting in the anemia. The patient has a history of prior GI blood loss which was exacerbated by Xarelto. The patient was on anticoagulation due to the congestive heart failure and atrial fibrillation however this has since been discontinued. He is currently only on aspirin therapy alone.  ??  The patient denies any overt bleeding issues. There were no findings of

## 2016-03-16 NOTE — Progress Notes
orthopedics and pain management. The patient received dose 2 of Injectafer in February 2016, July 2016, Septmeber 2016 and February 2017.   ??  The patient underwent a bone marrow biopsy and aspirate 04/28/15 to evaluate for possible MDS. This revealed a hyperplastic marrow (65% cellularity) with mild myeloid hyperplasia and erythroid hyperplasia. Flow cytometry showed no evidence of acute leukemia, B-cell lymphproliferative process or T-cell aberrancy. Cytogenetics: 26 XY karyotype. FISH analysis: Normal results seen for 3 every week, 5, 7, 8, 11(MLL), 12(ETV6), 17, 19, 20. This was interpreted as a normal result. In the absence of a cytogenetic/fish abnormality, the changes are likely reactive. Clinical correlation and followup are recommended.  ??  The patient received a transfusion in November 2016 and has received several doses of intravenous iron. The patient still has symptoms of fatigue but states that overall he is improved. The patient denies symptoms of bright red blood per rectum or black stools.  ??  The patient completed a course of parenteral iron infusions in July 2017.  The patient remains on Procrit 20,000 subcu every 2 weeks. The hematologic counts have remained stable/improved. The patient denies any medical issues and voices no new complaints. The patient states that he has been working out at [employer] on a regular basis.   Past Surgical History:   Past Surgical History:   Procedure Laterality Date   ? APPENDECTOMY     ? CORONARY ANGIOPLASTY WITH STENT PLACEMENT      x 2       Social History:    Social History     Social History   ? Marital status: Divorced     Spouse name: N/A   ? Number of children: N/A   ? Years of education: N/A     Occupational History   ? Not on file.     Social History Main Topics   ? Smoking status: Never Smoker   ? Smokeless tobacco: Not on file   ? Alcohol use 216.0 oz/week     18 Cans of beer per week   ? Drug use: No   ? Sexual activity: No     Other Topics Concern

## 2016-03-17 ENCOUNTER — Other Ambulatory Visit: Payer: Self-pay | Admitting: Family Medicine

## 2016-03-17 MED FILL — FENOFIBRATE 160 MG TABLET: 160 | 90 days supply | Qty: 90 | Fill #0

## 2016-03-25 MED FILL — LORazepam 0.5 MG TABS: 0.5 | 30 days supply | Qty: 150 | Fill #3

## 2016-03-29 MED FILL — ZOLPIDEM TARTRATE 10 MG TAB: 10 | 30 days supply | Qty: 30 | Fill #1

## 2016-03-30 ENCOUNTER — Inpatient Hospital Stay: Admit: 2016-03-30 | Discharge: 2016-03-31 | Primary: Internal Medicine

## 2016-03-30 DIAGNOSIS — N184 Chronic kidney disease, stage 4 (severe): Principal | ICD-10-CM

## 2016-03-30 DIAGNOSIS — D631 Anemia in chronic kidney disease: Secondary | ICD-10-CM

## 2016-03-30 MED ORDER — EPOETIN ALFA 20000 UNIT/ML IJ SOLN
20000 [IU] | Freq: Once | SUBCUTANEOUS | Status: CN
Start: 2016-03-30 — End: ?

## 2016-03-30 MED ORDER — EPOETIN ALFA 20000 UNIT/ML IJ SOLN
20000 [IU] | Freq: Once | SUBCUTANEOUS | Status: CP
Start: 2016-03-30 — End: ?

## 2016-04-01 MED FILL — CARBAMAZEPINE ER 100 MG TAB: 100 | 30 days supply | Qty: 60 | Fill #1

## 2016-04-13 ENCOUNTER — Inpatient Hospital Stay: Admit: 2016-04-13 | Discharge: 2016-04-14 | Primary: Internal Medicine

## 2016-04-13 DIAGNOSIS — N184 Chronic kidney disease, stage 4 (severe): Secondary | ICD-10-CM

## 2016-04-13 DIAGNOSIS — D631 Anemia in chronic kidney disease: Secondary | ICD-10-CM

## 2016-04-13 MED ORDER — EPOETIN ALFA 20000 UNIT/ML IJ SOLN
20000 [IU] | Freq: Once | SUBCUTANEOUS | Status: CN
Start: 2016-04-13 — End: ?

## 2016-04-13 MED ORDER — EPOETIN ALFA 20000 UNIT/ML IJ SOLN
20000 [IU] | Freq: Once | SUBCUTANEOUS | Status: CP
Start: 2016-04-13 — End: ?

## 2016-04-13 NOTE — Progress Notes
Patient tolerated injection well and voiced no complaints.  Patient went to checkout to schedule future appointments.  Patient was ambulatory when he left the treatment area

## 2016-04-19 MED FILL — ROSUVASTATIN CALCIUM 40 MG: 40 | 90 days supply | Qty: 90 | Fill #0

## 2016-04-19 MED FILL — LISINOPRIL 20 MG TABLET: 20 | 90 days supply | Qty: 90 | Fill #1

## 2016-04-20 ENCOUNTER — Ambulatory Visit (INDEPENDENT_AMBULATORY_CARE_PROVIDER_SITE_OTHER): Payer: PPO

## 2016-04-20 DIAGNOSIS — Z23 Encounter for immunization: Secondary | ICD-10-CM | POA: Diagnosis not present

## 2016-04-26 MED FILL — ZOLPIDEM TARTRATE 10 MG TAB: 10 | 30 days supply | Qty: 30 | Fill #2

## 2016-04-27 ENCOUNTER — Inpatient Hospital Stay: Admit: 2016-04-27 | Discharge: 2016-04-28 | Primary: Internal Medicine

## 2016-04-27 DIAGNOSIS — D631 Anemia in chronic kidney disease: Secondary | ICD-10-CM

## 2016-04-27 DIAGNOSIS — N184 Chronic kidney disease, stage 4 (severe): Principal | ICD-10-CM

## 2016-04-27 MED ORDER — EPOETIN ALFA 20000 UNIT/ML IJ SOLN
20000 [IU] | Freq: Once | SUBCUTANEOUS | Status: CN
Start: 2016-04-27 — End: ?

## 2016-04-27 MED ORDER — EPOETIN ALFA 20000 UNIT/ML IJ SOLN
20000 [IU] | Freq: Once | SUBCUTANEOUS | Status: CP
Start: 2016-04-27 — End: ?

## 2016-05-03 MED FILL — LORazepam 0.5 MG TABS: 0.5 | 30 days supply | Qty: 150 | Fill #4

## 2016-05-03 MED FILL — TAMSULOSIN HCL 0.4 MG CAP: 0.4 | 90 days supply | Qty: 90 | Fill #3

## 2016-05-03 MED FILL — CARBAMAZEPINE ER 100 MG TAB: 100 | 30 days supply | Qty: 60 | Fill #2

## 2016-05-11 ENCOUNTER — Inpatient Hospital Stay: Admit: 2016-05-11 | Discharge: 2016-05-12 | Primary: Internal Medicine

## 2016-05-11 ENCOUNTER — Ambulatory Visit: Admit: 2016-05-11 | Discharge: 2016-05-12 | Attending: Hematology & Oncology | Primary: Internal Medicine

## 2016-05-11 DIAGNOSIS — I10 Essential (primary) hypertension: Secondary | ICD-10-CM

## 2016-05-11 DIAGNOSIS — D61818 Other pancytopenia: Secondary | ICD-10-CM

## 2016-05-11 DIAGNOSIS — D696 Thrombocytopenia, unspecified: Secondary | ICD-10-CM

## 2016-05-11 DIAGNOSIS — E785 Hyperlipidemia, unspecified: Secondary | ICD-10-CM

## 2016-05-11 DIAGNOSIS — K635 Polyp of colon: Secondary | ICD-10-CM

## 2016-05-11 DIAGNOSIS — I509 Heart failure, unspecified: Secondary | ICD-10-CM

## 2016-05-11 DIAGNOSIS — I110 Hypertensive heart disease with heart failure: Secondary | ICD-10-CM

## 2016-05-11 DIAGNOSIS — I639 Cerebral infarction, unspecified: Secondary | ICD-10-CM

## 2016-05-11 DIAGNOSIS — E119 Type 2 diabetes mellitus without complications: Secondary | ICD-10-CM

## 2016-05-11 DIAGNOSIS — I4891 Unspecified atrial fibrillation: Secondary | ICD-10-CM

## 2016-05-11 DIAGNOSIS — Z79899 Other long term (current) drug therapy: Secondary | ICD-10-CM

## 2016-05-11 DIAGNOSIS — I2510 Atherosclerotic heart disease of native coronary artery without angina pectoris: Secondary | ICD-10-CM

## 2016-05-11 DIAGNOSIS — Z7982 Long term (current) use of aspirin: Secondary | ICD-10-CM

## 2016-05-11 DIAGNOSIS — F1010 Alcohol abuse, uncomplicated: Secondary | ICD-10-CM

## 2016-05-11 DIAGNOSIS — N184 Chronic kidney disease, stage 4 (severe): Principal | ICD-10-CM

## 2016-05-11 DIAGNOSIS — K5730 Diverticulosis of large intestine without perforation or abscess without bleeding: Secondary | ICD-10-CM

## 2016-05-11 DIAGNOSIS — N189 Chronic kidney disease, unspecified: Secondary | ICD-10-CM

## 2016-05-11 DIAGNOSIS — D631 Anemia in chronic kidney disease: Secondary | ICD-10-CM

## 2016-05-11 DIAGNOSIS — I6350 Cerebral infarction due to unspecified occlusion or stenosis of unspecified cerebral artery: Secondary | ICD-10-CM

## 2016-05-11 MED ORDER — EPOETIN ALFA 20000 UNIT/ML IJ SOLN
20000 [IU] | Freq: Once | SUBCUTANEOUS | Status: CN
Start: 2016-05-11 — End: ?

## 2016-05-11 MED ORDER — EPOETIN ALFA 20000 UNIT/ML IJ SOLN
20000 [IU] | Freq: Once | SUBCUTANEOUS | Status: CP
Start: 2016-05-11 — End: ?

## 2016-05-11 NOTE — Progress Notes
Other Topics Concern   ? Not on file     Social History Narrative       Family History:    Family History   Problem Relation Age of Onset   ? Diabetes Mother    ? Cancer Father        Vital Signs:   Vitals:    05/11/16 1052   BP: 144/70   Pulse: 79   Resp: 18   Temp: 36.8 ?C (98.2 ?F)   Weight: 104.8 kg (231 lb)   Height: 1.778 m (5\' 10" )       Allergies: No known drug allergy    Review of Systems:  Constitutional:?No weight loss  Fatigue-improved  No loss of appetite  No night sweats  No fever  No chills?  Weight loss due to diuresis  Eyes:?No blurred vision  No double vision  No difficulty seeing  Ears,Nose, Mouth,Throat: No hearing loss  No ringing in ears  No sinus trouble  No difficulty swallowing  No sore throat  No nasal drainage  No nose bleeds  No hoarseness  Cardiac:No heart papitations  No lightheadedness  Swelling in legs: much improved  Respiratory:No cough  No sputum production  No hemoptysis  No shortness of breath  No orthopnea  Gastrointestinal:No nausea  No vomiting  No heartburn  No constipation  No diarrhea  No abdominal pain  No rectal bleeding  No Bowel incontinence  Genitourinary:No burning on urination  No pain with urination  No blood in urine  No frequent urination  No urinary incontinence  Musculosketal:No muscle pain  No stiffness  No joint pain  No joint swelling  No back pain  Skin:No skin rash  Neurological:No seizures  No dizziness  No loss of balance  No weakness of limbs  No loss of sensation  Tingling sensations bilateral hands  No memory loss  No difficulty talking  Psychiatric:No nervousness  No depression  No restlessness  No difficulty sleeping  Hematologic/Lymphatic/Immunologic:No bleeding  No lumps in arm pits  No lumps in neck  No lumps in   ?      Medications:   Current Outpatient Prescriptions:   ?  aspirin 325 MG Tablet Delayed Release, , Disp: , Rfl:   ?  atorvastatin (LIPITOR) 40 MG tablet, Take 1 Tablet by mouth nightly., Disp: 30 Tablet, Rfl: 12

## 2016-05-11 NOTE — Progress Notes
?   Monocytes Absolute 04/13/2016 0.56    ? Monocytes % 04/13/2016 9.2    ? Eosinophils Absolute 04/13/2016 0.55    ? Eosinophils % 04/13/2016 9.1    ? Basophil Absolute 04/13/2016 0.01    ? BASOPHILS 04/13/2016 0.2    Hospital Outpatient Visit on 03/30/2016   Component Date Value   ? WBC 03/30/2016 6.82    ? RBC 03/30/2016 3.10    ? Hemoglobin 03/30/2016 9.0*   ? Hematocrit 03/30/2016 27.9*   ? MCV 03/30/2016 90.0    ? [facility] 03/30/2016 29.0    ? MCHC 03/30/2016 32.3    ? RDW 03/30/2016 14.1    ? Platelet Count 03/30/2016 112*   ? MPV 03/30/2016 7.9    ? Neutrophils Absolute 03/30/2016 5.09    ? Neutrophils % 03/30/2016 74.7    ? Lymphocytes Absolute 03/30/2016 0.52    ? Lymphocytes % 03/30/2016 7.6*   ? Monocytes Absolute 03/30/2016 0.65    ? Monocytes % 03/30/2016 9.5    ? Eosinophils Absolute 03/30/2016 0.54    ? Eosinophils % 03/30/2016 7.9    ? Basophil Absolute 03/30/2016 0.02    ? BASOPHILS 03/30/2016 0.3    Office Visit on 03/16/2016   Component Date Value   ? WBC 03/16/2016 5.53    ? RBC 03/16/2016 3.13    ? Hemoglobin 03/16/2016 9.2*   ? Hematocrit 03/16/2016 29.1*   ? MCV 03/16/2016 93.0    ? [place] 03/16/2016 29.4    ? MCHC 03/16/2016 31.6    ? RDW 03/16/2016 15.6    ? Platelet Count 03/16/2016 116*   ? MPV 03/16/2016 8.7    ? Neutrophils Absolute 03/16/2016 4.06    ? Neutrophils % 03/16/2016 73.4    ? Lymphocytes Absolute 03/16/2016 0.46    ? Lymphocytes % 03/16/2016 8.3*   ? Monocytes Absolute 03/16/2016 0.57    ? Monocytes % 03/16/2016 10.3    ? Eosinophils Absolute 03/16/2016 0.43    ? Eosinophils % 03/16/2016 7.8    ? Basophil Absolute 03/16/2016 0.01    ? BASOPHILS 03/16/2016 0.2          Imaging Results:     Health Maintenance: Colonoscopy:2015.    Physical Exam:   General: Well developed, well nourished, No acute Distress, Gentleman patient and Elderly appearance  Head:Atraumatic and normocephalic  Eyes:PERRLA, EOMS intact, No jaundice, pallor and Conjunctiva clear

## 2016-05-11 NOTE — Addendum Note
Addended by: Verne Grain on: 05/11/2016 06:41 PM      Modules accepted: Orders

## 2016-05-11 NOTE — Progress Notes
orthopedics and pain management. The patient received dose 2 of Injectafer in February 2016, July 2016, Septmeber 2016 and February 2017.   ??  The patient underwent a bone marrow biopsy and aspirate 04/28/15 to evaluate for possible MDS. This revealed a hyperplastic marrow (65% cellularity) with mild myeloid hyperplasia and erythroid hyperplasia. Flow cytometry showed no evidence of acute leukemia, B-cell lymphproliferative process or T-cell aberrancy. Cytogenetics: 55 XY karyotype. FISH analysis: Normal results seen for 3 every week, 5, 7, 8, 11(MLL), 12(ETV6), 17, 19, 20. This was interpreted as a normal result. In the absence of a cytogenetic/fish abnormality, the changes are likely reactive. Clinical correlation and followup are recommended.  ??  The patient received a transfusion in November 2016 and has received several doses of intravenous iron. The patient still has symptoms of fatigue but states that overall he is improved. The patient denies symptoms of bright red blood per rectum or black stools.  ??  The patient completed a course of parenteral iron infusions in July 2017. ?The patient remains on Procrit 20,000 subcu every 2 weeks. The patient denies any medical issues and voices no new complaints. The patient states that he has elected not to return to the GI physicians as he is unwilling to undergo any additional GI evaluations.    Past Surgical History:   Past Surgical History:   Procedure Laterality Date   ? APPENDECTOMY     ? CORONARY ANGIOPLASTY WITH STENT PLACEMENT      x 2       Social History:    Social History     Social History   ? Marital status: Divorced     Spouse name: N/A   ? Number of children: N/A   ? Years of education: N/A     Occupational History   ? Not on file.     Social History Main Topics   ? Smoking status: Never Smoker   ? Smokeless tobacco: Not on file   ? Alcohol use 216.0 oz/week     18 Cans of beer per week   ? Drug use: No   ? Sexual activity: No

## 2016-05-11 NOTE — Progress Notes
Ears, Nose, Throat, Neck, and Mouth: Trachea Midline, No JVD, No Lymphadenopathy, Thyroid midline-normal, Neck supple and No oral lesions  Cardiovascular:S1,S2 no murmurs and Regular heart beat  Chest:Symmetrical, No kyphosis and No scoliosis  Respiratory:Clear, No rales/Rhonchi  Gastrointestinal:Abdomen soft, Abdomen non-tender, Abdomen non-distended, Abdomen without masses, No ascites and No hepatosplenomegaly  Hematologic/Lymphatic:No petechiae, No purpura, No neck lymphadenopathy, No Axillary lymphadenopathy and No Groin lymphadenopathy  Musculoskeletal: ambulates without assistance  Extremities: 1+ edema BLE, No cyanosis, No digital clubbing and No discoloration, support stockings BLE  Neurologic:Alert and oriented, No Hemiparesis and No motor deficits    Assessment:  Jeffery Gonzales is a 50?year old male who was referred to the hematology office for an evaluation of anemia. This was most consistent with iron deficiency anemia. The patient completed a GI evaluation which did not reveal a specific source of bleeding. However, I suspect that the patient likely has had chronic GI blood loss resulting in the anemia. The patient has a history of prior GI blood loss which was exacerbated by Xarelto. The patient was on anticoagulation due to the congestive heart failure and atrial fibrillation however this has since been discontinued. He is currently only on aspirin therapy alone.  ??  The patient denies any overt bleeding issues. There were no findings of B12 deficiency, folate deficiency, or hemolysis. The bone marrow biopsy did not show evidence of leukemia, lymphoma, myelodysplasia, or myelofibrosis. The changes noted in the bone marrow were felt consistent with a reactive process. 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 ?The patient completed a course of parenteral iron

## 2016-05-11 NOTE — Patient Instructions
Pt to get labs and procrit today  F/U in 3 months with cbc

## 2016-05-11 NOTE — Progress Notes
?    clopidogrel (PLAVIX) 75 MG tablet, Take 1 Tablet by mouth daily., Disp: 30 Tablet, Rfl: 12  ?  folic acid 1 MG Tablet, , Disp: , Rfl:   ?  furosemide (LASIX) 40 MG tablet, Take 1 Tablet by mouth 2 times daily., Disp: 30 Tablet, Rfl: 12  ?  lisinopril (PRINIVIL,ZESTRIL) 10 MG Tablet, , Disp: , Rfl:   ?  oxyCODONE-acetaminophen (PERCOCET) 10-325 MG Tablet, Take 10-325 tablets by mouth daily., Disp: , Rfl:   ?  pantoprazole (PROTONIX) 40 MG tablet, Take 1 Tablet by mouth 2 times daily (before meals)., Disp: 60 Tablet, Rfl: 2  ?  STOOL SOFTENER LAXATIVE DC 240 MG Capsule, Take 240 mg by mouth daily., Disp: , Rfl:   ?  VOLTAREN 1 % Gel topical gel, Apply 1 % N20 topically daily., Disp: , Rfl:       Labs:   Hospital Outpatient Visit on 04/27/2016   Component Date Value   ? WBC 04/27/2016 5.61    ? RBC 04/27/2016 3.04    ? Hemoglobin 04/27/2016 8.0*   ? Hematocrit 04/27/2016 26.0*   ? MCV 04/27/2016 85.5    ? [facility] 04/27/2016 26.3    ? MCHC 04/27/2016 30.8    ? RDW 04/27/2016 16.1    ? Platelet Count 04/27/2016 143    ? MPV 04/27/2016 9.3    ? Neutrophils Absolute 04/27/2016 3.45    ? Neutrophils % 04/27/2016 61.5    ? Lymphocytes Absolute 04/27/2016 0.63    ? Lymphocytes % 04/27/2016 11.2*   ? Monocytes Absolute 04/27/2016 0.70    ? Monocytes % 04/27/2016 12.5    ? Eosinophils Absolute 04/27/2016 0.81    ? Eosinophils % 04/27/2016 14.4    ? Basophil Absolute 04/27/2016 0.02    ? BASOPHILS 04/27/2016 0.4    Hospital Outpatient Visit on 04/13/2016   Component Date Value   ? WBC 04/13/2016 6.06    ? RBC 04/13/2016 3.09    ? Hemoglobin 04/13/2016 8.6*   ? Hematocrit 04/13/2016 27.3*   ? MCV 04/13/2016 88.3    ? [place] 04/13/2016 27.8    ? MCHC 04/13/2016 31.5    ? RDW 04/13/2016 14.6    ? Platelet Count 04/13/2016 131*   ? MPV 04/13/2016 8.7    ? Neutrophils Absolute 04/13/2016 4.58    ? Neutrophils % 04/13/2016 75.6    ? Lymphocytes Absolute 04/13/2016 0.36    ? Lymphocytes % 04/13/2016 5.9*

## 2016-05-11 NOTE — Progress Notes
infusion with a significant improvement in the hematologic counts and iron indices. ?I have again strongly encourage the patient to return to his gastroenterologist for an evaluation. I have discussed with the patient the importance of undergoing an evaluation for GI bleeding sources including the possibility of malignancy. ?The patient verbalized an understanding of our discussion but states that he is unwilling to proceed with any further GI investigations and therefore has elected not to return to the gastroenterologist.    Recommendation/Plan:  1. We will continue?Procrit 20,000 units subcutaneous today and every 2 weeks.  2. We will obtain a CBC, BMP, ferritin, iron profile, B12 level, folate level today.  3. The patient will return for an exam and Procrit injection in 3 months.  4. As always, Jeffery Gonzales understands that he may return to the office sooner than the scheduled appointment if any questions or concerns arise during the interim.    Return to Clinic: Return in about 3 months (around 08/10/2016).

## 2016-05-11 NOTE — Progress Notes
Patient Name: Jeffery Gonzales    Date: 05/11/2016    Date Of Birth: [birth year]    History of Present Illness:  Mr. Roscher is a 89?year old Caucasian male who was referred to the hematology office for an evaluation of anemia. The patient has undergone an evaluation by Dr. Carolina Cellar at [facility] due to iron deficiency anemia. The patient has a history of a small bowel GI bleed in 2013 and a second episode in February 2015 most recently occurring in the setting of Xarelto use. The patient has required several transfusions in the past. The patient denies any recent evidence of bleeding specifically no bright red blood per rectum or black stools. He underwent a colonoscopy exam 10/17/2013 which revealed a 3 mm sessile polyp in the descending colon which was removed, a 3 mm sessile polyp in the descending colon which was removed, a diminutive sessile polyp in the sigmoid colon which was removed, severe diverticulosis and otherwise a normal exam. An upper endoscopy obtained 10/17/2013 revealed patchy erythema in the antrum of the stomach but otherwise a normal exam. A capsule endoscopy obtained 10/18/2013 revealed a small erythematous spot in the proximal-mi jejunum without stigmata for bleeding. The remainder of the small bowel exam was normal. As there were no overt bleeding sites identified on the GI evaluation the patient was referred to the hematology office for additional assessment of the anemia and to evaluate for a possible myelodysplasia.  ??  The patient has a significant history of coronary artery disease, diabetes, congestive heart failure, hypertension, hyperlipidemia. He states that he fell and required hip surgery in March 2015.  ??  The patient received a dose of Injectafer 06/18/2014 but did not return the following week to receive cycle 2. The patient states that he had been suffering from persistent pain in the hip and is being evaluated by

## 2016-05-12 DIAGNOSIS — D638 Anemia in other chronic diseases classified elsewhere: Principal | ICD-10-CM

## 2016-05-12 MED ORDER — SODIUM CHLORIDE FLUSH 0.9 % IV SOLN
10 mL | Status: CN | PRN
Start: 2016-05-12 — End: ?

## 2016-05-12 MED ORDER — DEXAMETHASONE SODIUM PHOSPHATE 20 MG/5ML IJ SOLN
20 mg | INTRAVENOUS | Status: CN | PRN
Start: 2016-05-12 — End: ?

## 2016-05-12 MED ORDER — EPINEPHRINE 0.3 MG/0.3ML IJ SOAJ
0.3 mg | INTRAMUSCULAR | Status: CN | PRN
Start: 2016-05-12 — End: ?

## 2016-05-12 MED ORDER — HEPARIN SODIUM LOCK FLUSH 100 UNIT/ML IV SOLN
500 [IU] | Status: CN | PRN
Start: 2016-05-12 — End: ?

## 2016-05-12 MED ORDER — FERRIC CARBOXYMALTOSE IVPB
750 mg | Freq: Once | INTRAVENOUS | Status: CN
Start: 2016-05-12 — End: ?

## 2016-05-12 MED ORDER — DIPHENHYDRAMINE HCL 50 MG/ML IJ SOLN
25 mg | INTRAVENOUS | Status: CN | PRN
Start: 2016-05-12 — End: ?

## 2016-05-12 MED ORDER — HYDROCORTISONE NA SUCCINATE PF 100 MG IJ SOLR
100 mg | INTRAVENOUS | Status: CN | PRN
Start: 2016-05-12 — End: ?

## 2016-05-12 MED ORDER — MEPERIDINE HCL 25 MG/ML IJ SOLN
25 mg | INTRAVENOUS | Status: CN | PRN
Start: 2016-05-12 — End: ?

## 2016-05-12 MED ORDER — SODIUM CHLORIDE 0.9 % IV SOLN
Freq: Once | INTRAVENOUS | Status: CN
Start: 2016-05-12 — End: ?

## 2016-05-12 NOTE — Addendum Note
Addended by: Jeanella Flattery on: 05/12/2016 08:21 AM      Modules accepted: Orders

## 2016-05-17 MED ORDER — EPOETIN ALFA 20000 UNIT/ML IJ SOLN
20000 [IU] | Freq: Once | SUBCUTANEOUS | Status: CN
Start: 2016-05-17 — End: ?

## 2016-05-18 ENCOUNTER — Inpatient Hospital Stay: Admit: 2016-05-18 | Discharge: 2016-05-19 | Primary: Internal Medicine

## 2016-05-18 DIAGNOSIS — D500 Iron deficiency anemia secondary to blood loss (chronic): Principal | ICD-10-CM

## 2016-05-18 MED ORDER — SODIUM CHLORIDE 0.9 % IV SOLN
Freq: Once | INTRAVENOUS | Status: CN
Start: 2016-05-18 — End: ?

## 2016-05-18 MED ORDER — HEPARIN SODIUM LOCK FLUSH 100 UNIT/ML IV SOLN
500 [IU] | Status: CN | PRN
Start: 2016-05-18 — End: ?

## 2016-05-18 MED ORDER — SODIUM CHLORIDE FLUSH 0.9 % IV SOLN
10 mL | Status: CN | PRN
Start: 2016-05-18 — End: ?

## 2016-05-18 MED ORDER — DEXAMETHASONE SODIUM PHOSPHATE 20 MG/5ML IJ SOLN
20 mg | INTRAVENOUS | Status: CN | PRN
Start: 2016-05-18 — End: ?

## 2016-05-18 MED ORDER — FERRIC CARBOXYMALTOSE IVPB
750 mg | Freq: Once | INTRAVENOUS | Status: CN
Start: 2016-05-18 — End: ?

## 2016-05-18 MED ORDER — DIPHENHYDRAMINE HCL 50 MG/ML IJ SOLN
25 mg | INTRAVENOUS | Status: CN | PRN
Start: 2016-05-18 — End: ?

## 2016-05-18 MED ORDER — FERRIC CARBOXYMALTOSE IVPB
750 mg | Freq: Once | INTRAVENOUS | Status: DC
Start: 2016-05-18 — End: 2016-05-19

## 2016-05-18 NOTE — Progress Notes
Presented for Injectafer. IV catheter inserted in right antecubital and infusion given. Well tolerated. Sent to check out to schedule future appointment. Discharge home in stable condition

## 2016-05-19 ENCOUNTER — Encounter: Payer: Self-pay | Admitting: Family

## 2016-05-25 ENCOUNTER — Inpatient Hospital Stay: Primary: Internal Medicine

## 2016-05-27 ENCOUNTER — Encounter: Payer: Self-pay | Admitting: Family

## 2016-05-27 ENCOUNTER — Ambulatory Visit (INDEPENDENT_AMBULATORY_CARE_PROVIDER_SITE_OTHER): Payer: PPO | Admitting: Family

## 2016-05-27 ENCOUNTER — Ambulatory Visit (HOSPITAL_COMMUNITY)
Admission: RE | Admit: 2016-05-27 | Discharge: 2016-05-27 | Disposition: A | Discharge status: Home / Self Care | Payer: PPO | Source: Ambulatory Visit | Attending: Family | Admitting: Family

## 2016-05-27 VITALS — BP 157/84 | HR 63 | Temp 97.2°F | Resp 20 | Wt 235.0 lb

## 2016-05-27 DIAGNOSIS — Z48812 Encounter for surgical aftercare following surgery on the circulatory system: Secondary | ICD-10-CM | POA: Insufficient documentation

## 2016-05-27 DIAGNOSIS — Z9889 Other specified postprocedural states: Secondary | ICD-10-CM | POA: Diagnosis not present

## 2016-05-27 DIAGNOSIS — Z87891 Personal history of nicotine dependence: Secondary | ICD-10-CM | POA: Diagnosis not present

## 2016-05-27 DIAGNOSIS — I6523 Occlusion and stenosis of bilateral carotid arteries: Secondary | ICD-10-CM

## 2016-05-27 DIAGNOSIS — I6521 Occlusion and stenosis of right carotid artery: Secondary | ICD-10-CM

## 2016-05-27 LAB — VAS US CAROTID
LCCAPDIAS: 15 cm/s
LEFT ECA DIAS: -12 cm/s
LEFT VERTEBRAL DIAS: 11 cm/s
LICAPDIAS: -8 cm/s
Left CCA dist dias: -15 cm/s
Left CCA dist sys: -67 cm/s
Left CCA prox sys: 106 cm/s
Left ICA dist dias: -16 cm/s
Left ICA dist sys: -58 cm/s
Left ICA prox sys: -39 cm/s
RCCAPDIAS: -9 cm/s
RIGHT CCA MID DIAS: 16 cm/s
RIGHT ECA DIAS: -15 cm/s
Right CCA prox sys: -82 cm/s
Right cca dist sys: -79 cm/s

## 2016-05-27 NOTE — Patient Instructions (Signed)
Stroke Prevention Some medical conditions and behaviors are associated with an increased chance of having a stroke. You may prevent a stroke by making healthy choices and managing medical conditions. How can I reduce my risk of having a stroke?  Stay physically active. Get at least 30 minutes of activity on most or all days.  Do not smoke. It may also be helpful to avoid exposure to secondhand smoke.  Limit alcohol use. Moderate alcohol use is considered to be:  No more than 2 drinks per day for men.  No more than 1 drink per day for nonpregnant women.  Eat healthy foods. This involves:  Eating 5 or more servings of fruits and vegetables a day.  Making dietary changes that address high blood pressure (hypertension), high cholesterol, diabetes, or obesity.  Manage your cholesterol levels.  Making food choices that are high in fiber and low in saturated fat, trans fat, and cholesterol may control cholesterol levels.  Take any prescribed medicines to control cholesterol as directed by your health care provider.  Manage your diabetes.  Controlling your carbohydrate and sugar intake is recommended to manage diabetes.  Take any prescribed medicines to control diabetes as directed by your health care provider.  Control your hypertension.  Making food choices that are low in salt (sodium), saturated fat, trans fat, and cholesterol is recommended to manage hypertension.  Ask your health care provider if you need treatment to lower your blood pressure. Take any prescribed medicines to control hypertension as directed by your health care provider.  If you are 18-39 years of age, have your blood pressure checked every 3-5 years. If you are 40 years of age or older, have your blood pressure checked every year.  Maintain a healthy weight.  Reducing calorie intake and making food choices that are low in sodium, saturated fat, trans fat, and cholesterol are recommended to manage  weight.  Stop drug abuse.  Avoid taking birth control pills.  Talk to your health care provider about the risks of taking birth control pills if you are over 35 years old, smoke, get migraines, or have ever had a blood clot.  Get evaluated for sleep disorders (sleep apnea).  Talk to your health care provider about getting a sleep evaluation if you snore a lot or have excessive sleepiness.  Take medicines only as directed by your health care provider.  For some people, aspirin or blood thinners (anticoagulants) are helpful in reducing the risk of forming abnormal blood clots that can lead to stroke. If you have the irregular heart rhythm of atrial fibrillation, you should be on a blood thinner unless there is a good reason you cannot take them.  Understand all your medicine instructions.  Make sure that other conditions (such as anemia or atherosclerosis) are addressed. Get help right away if:  You have sudden weakness or numbness of the face, arm, or leg, especially on one side of the body.  Your face or eyelid droops to one side.  You have sudden confusion.  You have trouble speaking (aphasia) or understanding.  You have sudden trouble seeing in one or both eyes.  You have sudden trouble walking.  You have dizziness.  You have a loss of balance or coordination.  You have a sudden, severe headache with no known cause.  You have new chest pain or an irregular heartbeat. Any of these symptoms may represent a serious problem that is an emergency. Do not wait to see if the symptoms will go away.   Get medical help at once. Call your local emergency services (911 in [place] Do not drive yourself to the hospital. This information is not intended to replace advice given to you by your health care provider. Make sure you discuss any questions you have with your health care provider. Document Released: 07/08/2004 Document Revised: 11/06/2015 Document Reviewed: 12/01/2012 Elsevier  Interactive Patient Education  2017 Elsevier Inc.  

## 2016-05-27 NOTE — Progress Notes (Signed)
Chief Complaint: Follow up Extracranial Carotid Artery Stenosis   History of Present Illness  William [name] is a 75 y.o. male patient of Dr. Oneida Alar who is status post right CEA in 2006, left CEA in 2010, returns today for follow up.   Patient has positive history of a preoperative stroke in 2006, symptoms manifested by trouble getting out of bed during the night, left arm and leg weakness with slurred speech, vision was affected but wife and patient do not remember which eye, also had left facial drooping.  The patient's previous neurologic deficits are improved, he still has left arm and leg weakness, left facial drooping. He has had no further stroke or TIA symptoms since 2006.  Pt states the chronic tongue moving habit started about June 2015, he attributes to a medication that causes dry mouth.   Pt denies claudication symptoms with walking, denies non healing wounds.  Pt Diabetic: No Pt smoker: former smoker, quit in 2006  Pt meds include: Statin : Yes ASA: Yes Other anticoagulants/antiplatelets: no     Past Medical History:  Diagnosis Date  . ANXIETY   . Arthritis    low back  . Bladder stone   . BPH (benign prostatic hypertrophy)   . Carotid artery occlusion   . 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 CVD (cerebrovascular disease)   . Eczema   . GERD (gastroesophageal reflux disease)   . History of carotid artery stenosis    S/P BILATERAL CEA  . 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 Hyperlipidemia   . Hypertension   . Nocturia   . S/P carotid endarterectomy    BILATERAL ICA--  PATENT PER DUPLEX  05-19-2012  . Stroke [facility]) 1996   pt states no weaknes but has severe anxiety  . Urinary frequency   . Vitamin D deficiency     Social History Social History  Substance Use Topics  . Smoking status: Former Smoker    Packs/day: 2.00    Years: 40.00    Types: Cigarettes    Quit date: 02/15/2005  . Smokeless  tobacco: Never Used  . Alcohol use 0.0 oz/week     Comment: Occasional    Family History Family History  Problem Relation Age of Onset  . Heart disease Mother     CHF  . Bipolar disorder Mother   . Heart disease Father     CHF    Surgical History Past Surgical History:  Procedure Laterality Date  . APPENDECTOMY  AS CHILD  . CARDIOVASCULAR STRESS TEST  03-27-2012  DR CRENSHAW   LOW RISK LEXISCAN STUDY-- PROBABLE NORMAL PERFUSION AND SOFT TISSUE ATTENUATION/  NO ISCHEMIA/ EF 51%  . CAROTID ENDARTERECTOMY Bilateral LEFT  11-12-2008  DR GREG HAYES   RIGHT ICA  2006  ([facility])  . CYSTOSCOPY WITH LITHOLAPAXY N/A 02/26/2013   Procedure: CYSTOSCOPY WITH LITHOLAPAXY;  Surgeon: Franchot Gallo, MD;  Location: [facility];  Service: Urology;  Laterality: N/A;  . EYE SURGERY  Jan. 2016   cataract surgery both eyes  . INGUINAL HERNIA REPAIR Right 11-08-2006  . MASS EXCISION N/A 03/03/2016   Procedure: EXCISION OF BACK  MASS;  Surgeon: Stark Klein, MD;  Location: [facility];  Service: General;  Laterality: N/A;  . 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 PROSTATE SURGERY    . TRANSURETHRAL RESECTION OF PROSTATE N/A 02/26/2013   Procedure:  TRANSURETHRAL RESECTION OF THE PROSTATE WITH GYRUS INSTRUMENTS;  Surgeon: Franchot Gallo, MD;  Location[facility];  Service: Urology;  Laterality: N/A;    Allergies  Allergen Reactions  . Adhesive [Tape] Other (See Comments)    BLISTER  . Strawberry (Diagnostic) Hives    Current Outpatient Prescriptions  Medication Sig Dispense Refill  . aspirin 325 MG EC tablet Take 325 mg by mouth daily.    . B Complex Vitamins (VITAMIN B COMPLEX PO) Take by mouth daily.      . Calcium Carb-Cholecalciferol (CALCIUM 1000 + D PO) Take by mouth daily.      . carbamazepine (TEGRETOL-XR) 100 MG 12 hr tablet 1 qam  1  qhs 60 tablet 8  . Cholecalciferol (VITAMIN D) 2000 UNITS CAPS Take by mouth.    .  Eszopiclone 3 MG TABS TAKE 1 TABLET BY MOUTH AT BEDTIME AS NEEDED TAKE IMMEDIATELY BEFORE BEDTIME 30 tablet 1  . fenofibrate 160 MG tablet TAKE 1 TABLET (160 MG TOTAL) BY MOUTH DAILY. REPEAT LABS ARE DUE NOW 90 tablet 0  . Flaxseed, Linseed, (FLAX SEED OIL PO) Take by mouth daily.      [name] levocetirizine (XYZAL) 5 MG tablet Take 1 tablet (5 mg total) by mouth every evening. 30 tablet 5  . lisinopril (PRINIVIL,ZESTRIL) 20 MG tablet Take 1 tablet (20 mg total) by mouth daily. 90 tablet 3  . LORazepam (ATIVAN) 0.5 MG tablet 2 bid  1  qday  prn 150 tablet 5  . Multiple Vitamin (MULTIVITAMIN) tablet Take 1 tablet by mouth daily.    [name] NEXIUM 40 MG capsule TAKE 1 CAPSULE BY MOUTH AS NEEDED 90 capsule 0  . rosuvastatin (CRESTOR) 40 MG tablet Take 1 tablet (40 mg total) by mouth daily. 90 tablet 3  . tamsulosin (FLOMAX) 0.4 MG CAPS capsule 1 po qd 30 capsule    No current facility-administered medications for this visit.     Review of Systems : See HPI for pertinent positives and negatives.  Physical Examination  Vitals:   05/27/16 1026 05/27/16 1028  BP: (!) 170/83 (!) 157/84  Pulse: 92 63  Resp: 20   Temp: 97.2 F (36.2 C)   SpO2: 100%   Weight: 235 lb (106.6 kg)    Body mass index is 32.78 kg/m.  General: WDWN obese male in NAD GAIT: normal Eyes: PERRLA Pulmonary: Respirations are non labored, CTAB, good air movement. Cardiac: regular rhythm, no detected murmur.  VASCULAR EXAM Carotid Bruits Left Right   Negative Negative   Aorta is not palpable. Radial pulses: 2+ right, 1+ left.      LE Pulses LEFT RIGHT   POPLITEAL  palpable   palpable   POSTERIOR TIBIAL not palpable  not palpable    DORSALIS PEDIS  ANTERIOR TIBIAL palpable  palpable     Gastrointestinal: soft, nontender,  BS WNL, no r/g, no palpable masses.  Musculoskeletal: No muscle atrophy/wasting. M/S 5/5 throughout except he is unable to raise his left arm as high as his right, Extremities without ischemic changes.  Neurologic: A&O X 3; Appropriate Affect, Speech is normal, CN 2-12 intact except left facial droop with smile only, pain and light touch intact in extremities, Motor exam as listed above   Assessment: William [name] is a 75 y.o. male who is status post right CEA in 2006, left CEA in 2010. He has a hx of a preoperative stroke in 2006, no strokes or TIA's subsequently.    DATA Carotid duplex today  suggests <40% bilateral CEA site stenosis. Bilateral vertebral artery flow is antegrade.  Bilateral subclavian artery waveforms are normal.   No significant change since the last exam on 05-26-15.   Plan: Follow-up in 1 year with Carotid Duplex scan.   I discussed in depth with the patient the nature of atherosclerosis, and emphasized the importance of maximal medical management including strict control of blood pressure, blood glucose, and lipid levels, obtaining regular exercise, and continued cessation of smoking.  The patient is aware that without maximal medical management the underlying atherosclerotic disease process will progress, limiting the benefit of any interventions. The patient was given information about stroke prevention and what symptoms should prompt the patient to seek immediate medical care. Thank you for allowing [place] to participate in this patient's care.  Clemon Chambers, RN, MSN, FNP-C [facility] Office: [phone]  Clinic Physician: Scot Dock  05/27/16 10:52 AM

## 2016-05-28 ENCOUNTER — Inpatient Hospital Stay: Admit: 2016-05-28 | Discharge: 2016-05-29 | Primary: Internal Medicine

## 2016-05-28 DIAGNOSIS — E119 Type 2 diabetes mellitus without complications: Secondary | ICD-10-CM

## 2016-05-28 DIAGNOSIS — I639 Cerebral infarction, unspecified: Secondary | ICD-10-CM

## 2016-05-28 DIAGNOSIS — I509 Heart failure, unspecified: Principal | ICD-10-CM

## 2016-05-28 DIAGNOSIS — F1010 Alcohol abuse, uncomplicated: Secondary | ICD-10-CM

## 2016-05-28 DIAGNOSIS — D500 Iron deficiency anemia secondary to blood loss (chronic): Principal | ICD-10-CM

## 2016-05-28 DIAGNOSIS — I6350 Cerebral infarction due to unspecified occlusion or stenosis of unspecified cerebral artery: Secondary | ICD-10-CM

## 2016-05-28 DIAGNOSIS — I4891 Unspecified atrial fibrillation: Secondary | ICD-10-CM

## 2016-05-28 DIAGNOSIS — I10 Essential (primary) hypertension: Secondary | ICD-10-CM

## 2016-05-28 MED ORDER — SODIUM CHLORIDE FLUSH 0.9 % IV SOLN
10 mL | Status: CN | PRN
Start: 2016-05-28 — End: ?

## 2016-05-28 MED ORDER — FERRIC CARBOXYMALTOSE IVPB
750 mg | Freq: Once | INTRAVENOUS | Status: DC
Start: 2016-05-28 — End: 2016-05-29

## 2016-05-28 MED ORDER — SODIUM CHLORIDE 0.9 % IV SOLN
Freq: Once | INTRAVENOUS | Status: CN
Start: 2016-05-28 — End: ?

## 2016-05-28 MED ORDER — HEPARIN SODIUM LOCK FLUSH 100 UNIT/ML IV SOLN
500 [IU] | Status: CN | PRN
Start: 2016-05-28 — End: ?

## 2016-05-28 MED ORDER — DEXAMETHASONE SODIUM PHOSPHATE 20 MG/5ML IJ SOLN
20 mg | INTRAVENOUS | Status: CN | PRN
Start: 2016-05-28 — End: ?

## 2016-05-28 MED ORDER — FERRIC CARBOXYMALTOSE IVPB
750 mg | Freq: Once | INTRAVENOUS | Status: CN
Start: 2016-05-28 — End: ?

## 2016-05-28 MED ORDER — DIPHENHYDRAMINE HCL 50 MG/ML IJ SOLN
25 mg | INTRAVENOUS | Status: CN | PRN
Start: 2016-05-28 — End: ?

## 2016-05-28 NOTE — Progress Notes
Presented for Iron infusion. IV catheter inserted and infusion given. Well tolerated. Sent to check out to schedule future appointment. Discharge home in stable condition

## 2016-05-31 MED FILL — CARBAMAZEPINE ER 100 MG TAB: 100 | 30 days supply | Qty: 60 | Fill #3

## 2016-05-31 MED FILL — ZOLPIDEM TARTRATE 10 MG TAB: 10 | 30 days supply | Qty: 30 | Fill #3

## 2016-06-01 ENCOUNTER — Ambulatory Visit (INDEPENDENT_AMBULATORY_CARE_PROVIDER_SITE_OTHER): Payer: PPO | Admitting: Medical

## 2016-06-01 ENCOUNTER — Encounter: Payer: Self-pay | Admitting: Medical

## 2016-06-01 VITALS — BP 122/59 | HR 67 | Temp 97.8°F | Resp 16 | Ht 70.0 in | Wt 234.4 lb

## 2016-06-01 DIAGNOSIS — J3489 Other specified disorders of nose and nasal sinuses: Secondary | ICD-10-CM | POA: Diagnosis not present

## 2016-06-01 DIAGNOSIS — J029 Acute pharyngitis, unspecified: Secondary | ICD-10-CM

## 2016-06-01 DIAGNOSIS — R0981 Nasal congestion: Secondary | ICD-10-CM | POA: Diagnosis not present

## 2016-06-01 LAB — POCT RAPID STREP A (OFFICE): RAPID STREP A SCREEN: NEGATIVE

## 2016-06-01 MED ORDER — AMOXICILLIN-POT CLAVULANATE 875-125 MG PO TABS
1.0000 | ORAL_TABLET | Freq: Two times a day (BID) | ORAL | 0 refills | Status: DC
Start: 2016-06-01 — End: 2016-06-22

## 2016-06-01 MED ORDER — FLUTICASONE PROPIONATE 50 MCG/ACT NA SUSP: 2.0000 | Freq: Every day | NASAL | 1 refills | Status: DC

## 2016-06-01 MED FILL — AMOX-CLAV 875-125 MG TABLET: 875-125 | 10 days supply | Qty: 20 | Fill #0

## 2016-06-01 MED FILL — FLUTICASONE PROP 50 MCG SPR: 50 | 30 days supply | Qty: 16 | Fill #0

## 2016-06-01 NOTE — Patient Instructions (Signed)
You strep test was negative. We are sending out throat culture.   You level of sinus pain indicate probable sinus infection. Will rx augmentin antibiotic. For nasal congestion rx flonase.  We will follow the throat culture. If culture were positive and your throat is not feeling better would need to give different antibiotic such as clindamycin.  Follow up 7 days or as needed

## 2016-06-01 NOTE — Progress Notes (Signed)
Pre visit review using our clinic review tool, if applicable. No additional management support is needed unless otherwise documented below in the visit note/SLS  

## 2016-06-01 NOTE — Progress Notes (Signed)
Subjective:    Patient ID: William [name], male    DOB: 13-Jan-1941, 75 y.o.   MRN: [id number]  HPI    Pt in for st recently with sinus pressure. Pain since past Friday. Nasal congestion. No fever, no chills or sweats. No cough and no wheezing. No otc meds except chlorasceptic spray.  St is moderate to severe.    Review of Systems  Constitutional: Negative for chills, fatigue and fever.  HENT: Positive for congestion, sinus pain, sinus pressure and sore throat.        Throat hurts worse than sinuses.  Respiratory: Negative for cough, choking, shortness of breath and wheezing.   Cardiovascular: Negative for chest pain and palpitations.  Gastrointestinal: Negative for abdominal pain.  Musculoskeletal: Negative for back pain and myalgias.       Faint body achiness early on on Friday.(description not flu like)  Skin: Negative for rash.  Neurological: Negative for dizziness, syncope, weakness and headaches.  Hematological: Negative for adenopathy. Does not bruise/bleed easily.  Psychiatric/Behavioral: Negative for behavioral problems, confusion and sleep disturbance.    Past Medical History:  Diagnosis Date  . ANXIETY   . Arthritis    low back  . Bladder stone   . BPH (benign prostatic hypertrophy)   . Carotid artery occlusion   . 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 CVD (cerebrovascular disease)   . Eczema   . GERD (gastroesophageal reflux disease)   . History of carotid artery stenosis    S/P BILATERAL CEA  . 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 Hyperlipidemia   . Hypertension   . Nocturia   . S/P carotid endarterectomy    BILATERAL ICA--  PATENT PER DUPLEX  05-19-2012  . Stroke [facility]) 1996   pt states no weaknes but has severe anxiety  . Urinary frequency   . Vitamin D deficiency      Social History   Social History  . Marital status: Married    Spouse name: N/A  . Number of children: 2  . Years of education:  N/A   Occupational History  .  Retired   Social History Main Topics  . Smoking status: Former Smoker    Packs/day: 2.00    Years: 40.00    Types: Cigarettes    Quit date: 02/15/2005  . Smokeless tobacco: Never Used  . Alcohol use 0.0 oz/week     Comment: Occasional  . Drug use: No  . Sexual activity: Yes    Partners: Female   Other Topics Concern  . Not on file   Social History Narrative   Exercise--  Walks dogs everyday    Past Surgical History:  Procedure Laterality Date  . APPENDECTOMY  AS CHILD  . CARDIOVASCULAR STRESS TEST  03-27-2012  DR CRENSHAW   LOW RISK LEXISCAN STUDY-- PROBABLE NORMAL PERFUSION AND SOFT TISSUE ATTENUATION/  NO ISCHEMIA/ EF 51%  . CAROTID ENDARTERECTOMY Bilateral LEFT  11-12-2008  DR GREG HAYES   RIGHT ICA  2006  ([facility])  . CYSTOSCOPY WITH LITHOLAPAXY N/A 02/26/2013   Procedure: CYSTOSCOPY WITH LITHOLAPAXY;  Surgeon: Franchot Gallo, MD;  Location: [facility];  Service: Urology;  Laterality: N/A;  . EYE SURGERY  Jan. 2016   cataract surgery both eyes  . INGUINAL HERNIA REPAIR Right 11-08-2006  . MASS EXCISION N/A 03/03/2016   Procedure: EXCISION OF BACK  MASS;  Surgeon: Stark Klein, MD;  Location: [facility];  Service: General;  Laterality: N/A;  . 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 PROSTATE SURGERY    . TRANSURETHRAL RESECTION OF PROSTATE N/A 02/26/2013   Procedure: TRANSURETHRAL RESECTION OF THE PROSTATE WITH GYRUS INSTRUMENTS;  Surgeon: Franchot Gallo, MD;  Location: [facility];  Service: Urology;  Laterality: N/A;    Family History  Problem Relation Age of Onset  . Heart disease Mother     CHF  . Bipolar disorder Mother   . Heart disease Father     CHF    Allergies  Allergen Reactions  . Adhesive [Tape] Other (See Comments)    BLISTER  . Strawberry (Diagnostic) Hives    Current Outpatient Prescriptions on File Prior to Visit  Medication Sig Dispense Refill    . aspirin 325 MG EC tablet Take 325 mg by mouth daily.    . B Complex Vitamins (VITAMIN B COMPLEX PO) Take by mouth daily.      . Calcium Carb-Cholecalciferol (CALCIUM 1000 + D PO) Take by mouth daily.      . carbamazepine (TEGRETOL-XR) 100 MG 12 hr tablet 1 qam  1  qhs 60 tablet 8  . Cholecalciferol (VITAMIN D) 2000 UNITS CAPS Take by mouth.    . Eszopiclone 3 MG TABS TAKE 1 TABLET BY MOUTH AT BEDTIME AS NEEDED TAKE IMMEDIATELY BEFORE BEDTIME 30 tablet 1  . fenofibrate 160 MG tablet TAKE 1 TABLET (160 MG TOTAL) BY MOUTH DAILY. REPEAT LABS ARE DUE NOW 90 tablet 0  . Flaxseed, Linseed, (FLAX SEED OIL PO) Take by mouth daily.      [name] levocetirizine (XYZAL) 5 MG tablet Take 1 tablet (5 mg total) by mouth every evening. (Patient taking differently: Take 5 mg by mouth daily as needed. ) 30 tablet 5  . lisinopril (PRINIVIL,ZESTRIL) 20 MG tablet Take 1 tablet (20 mg total) by mouth daily. 90 tablet 3  . LORazepam (ATIVAN) 0.5 MG tablet 2 bid  1  qday  prn 150 tablet 5  . Multiple Vitamin (MULTIVITAMIN) tablet Take 1 tablet by mouth daily.    [name] NEXIUM 40 MG capsule TAKE 1 CAPSULE BY MOUTH AS NEEDED 90 capsule 0  . rosuvastatin (CRESTOR) 40 MG tablet Take 1 tablet (40 mg total) by mouth daily. 90 tablet 3  . tamsulosin (FLOMAX) 0.4 MG CAPS capsule 1 po qd 30 capsule    No current facility-administered medications on file prior to visit.     BP (!) 122/59 (BP Location: Right Arm, Patient Position: Sitting, Cuff Size: Large)   Pulse 67   Temp 97.8 F (36.6 C) (Oral)   Resp 16   Ht 5\' 10"  (1.778 m)   Wt 234 lb 6 oz (106.3 kg)   SpO2 96%   BMI 33.63 kg/m       Objective:   Physical Exam  General  Mental Status - Alert. General Appearance - Well groomed. Not in acute distress.  Skin Rashes- No Rashes.  HEENT Head- Normal. Ear Auditory Canal - Left- Normal. Right - Normal.Tympanic Membrane- Left- Normal. Right- Normal. Eye Sclera/Conjunctiva- Left- Normal. Right- Normal. Nose &  Sinuses Nasal Mucosa- Left-  Boggy and Congested. Right-  Boggy and  Congested.Rt maxillary sinus pressure moderate to severe on  light touch  but no  frontal sinus pressure. Mouth & Throat Lips: Upper Lip- Normal: no dryness, cracking, pallor, cyanosis, or vesicular eruption. Lower Lip-Normal: no dryness, cracking, pallor, cyanosis or vesicular eruption. Buccal Mucosa- Bilateral-  No Aphthous ulcers. Oropharynx- No Discharge or Erythema. Tonsils: Characteristics- Bilateral- faint mild  Erythema and faint Congestion. Size/Enlargement- Bilateral- No enlargement. Discharge- bilateral-None.  Neck Neck- Supple. No Masses.   Chest and Lung Exam Auscultation: Breath Sounds:-Clear even and unlabored.  Cardiovascular Auscultation:Rythm- Regular, rate and rhythm. Murmurs & Other Heart Sounds:Ausculatation of the heart reveal- No Murmurs.  Lymphatic Head & Neck General Head & Neck Lymphatics: Bilateral: Description- No Localized lymphadenopathy.       Assessment & Plan:  You strep test was negative. We are sending out throat culture.   You level of sinus pain indicate probable sinus infection. Will rx augmentin antibiotic. For nasal congestion rx flonase.  We will follow the throat culture. If culture were positive and your throat is not feeling better would need to give different antibiotic such as clindamycin.  Follow up 7 days or as needed  Khaleesi Gruel, Percell Miller, [employer]

## 2016-06-01 NOTE — Addendum Note (Signed)
Addended by: Lianne Cure A on: 06/01/2016 11:24 AM   Modules accepted: Orders

## 2016-06-03 LAB — CULTURE, GROUP A STREP: Organism ID, Bacteria: NORMAL

## 2016-06-04 MED ORDER — EPOETIN ALFA 20000 UNIT/ML IJ SOLN
20000 [IU] | Freq: Once | SUBCUTANEOUS | Status: CN
Start: 2016-06-04 — End: ?

## 2016-06-08 ENCOUNTER — Other Ambulatory Visit: Payer: Self-pay | Admitting: Family Medicine

## 2016-06-08 ENCOUNTER — Inpatient Hospital Stay: Admit: 2016-06-08 | Discharge: 2016-06-09 | Primary: Internal Medicine

## 2016-06-08 DIAGNOSIS — D631 Anemia in chronic kidney disease: Secondary | ICD-10-CM

## 2016-06-08 DIAGNOSIS — D696 Thrombocytopenia, unspecified: Secondary | ICD-10-CM

## 2016-06-08 DIAGNOSIS — N184 Chronic kidney disease, stage 4 (severe): Secondary | ICD-10-CM

## 2016-06-08 DIAGNOSIS — D61818 Other pancytopenia: Secondary | ICD-10-CM

## 2016-06-08 DIAGNOSIS — D638 Anemia in other chronic diseases classified elsewhere: Secondary | ICD-10-CM

## 2016-06-08 MED FILL — NexIUM 40 MG CPDR: 40 | 90 days supply | Qty: 90 | Fill #0

## 2016-06-10 MED ORDER — FERRIC CARBOXYMALTOSE IVPB
750 mg | Freq: Once | INTRAVENOUS | Status: CN
Start: 2016-06-10 — End: ?

## 2016-06-10 NOTE — Progress Notes
Jeffery Flattery, MD  [name], [place] ?   ?    ?    ?   ? If the %sat needs to be > 20 prior to resuming Procrit, then give one additional dose of Injectafer and repeat the %sat 2 weeks later.       Ordered (TORB) Injectafer x 1 dose plus TIBC/Sat% to be completed 14 days after completion of Injectafer.

## 2016-06-11 ENCOUNTER — Inpatient Hospital Stay: Primary: Internal Medicine

## 2016-06-15 ENCOUNTER — Other Ambulatory Visit: Payer: Self-pay | Admitting: Family Medicine

## 2016-06-15 MED FILL — FENOFIBRATE 160 MG TABLET: 160 | 90 days supply | Qty: 90 | Fill #0

## 2016-06-15 MED FILL — LORazepam 0.5 MG TABS: 0.5 | 30 days supply | Qty: 150 | Fill #0

## 2016-06-16 ENCOUNTER — Inpatient Hospital Stay: Admit: 2016-06-16 | Discharge: 2016-06-17 | Primary: Internal Medicine

## 2016-06-16 DIAGNOSIS — D500 Iron deficiency anemia secondary to blood loss (chronic): Principal | ICD-10-CM

## 2016-06-16 DIAGNOSIS — N184 Chronic kidney disease, stage 4 (severe): Secondary | ICD-10-CM

## 2016-06-16 MED ORDER — FERRIC CARBOXYMALTOSE IVPB
750 mg | Freq: Once | INTRAVENOUS | Status: CN
Start: 2016-06-16 — End: ?

## 2016-06-16 MED ORDER — FERRIC CARBOXYMALTOSE IVPB
750 mg | Freq: Once | INTRAVENOUS | Status: DC
Start: 2016-06-16 — End: 2016-06-17

## 2016-06-16 NOTE — Progress Notes
Patient tolerated treatment well and voiced no complaints.  Patient went to checkout to schedule future appointments.  Patient was ambulatory when he left the treatment area

## 2016-06-21 NOTE — Progress Notes (Signed)
Pre visit review using our clinic review tool, if applicable. No additional management support is needed unless otherwise documented below in the visit note. 

## 2016-06-21 NOTE — Progress Notes (Addendum)
Subjective:   William [name] is a 76 y.o. male who presents for Medicare Annual/Subsequent preventive examination.  Review of Systems: No ROS.  Medicare Wellness Visit.  Cardiac Risk Factors include: advanced age (>22men, >64 women);dyslipidemia;male gender;obesity (BMI >30kg/m2);hypertension Sleep patterns: Sleeps very well. Wakes up 1-2 x/night to urinate. Follows with Dr.Dalhsted with urology. Feels rested when he wake. Living environment; residence and Firearm Safety: Lives with wife and 5 dogs. No guns. Feels safe. Seat Belt Safety/Bike Helmet: Wears seat belt.   Counseling:   Eye Exam- Cataract sx on both eyes. No longer following w/ eye doctor. Feels like he sees fine now.  Dental- Follows with [facility] 6 months.  Male:   CCS-  Last on 02/27/15: 2 Polyps found and removed. Diverticulosis is sigmoid colon. Repeat in 5 years per report. PSA-  Lab Results  Component Value Date   PSA 2.14 07/17/2015   PSA 1.94 12/24/2013   PSA 6.21 (H) 11/08/2012        Objective:    Vitals: BP (!) 144/75 (BP Location: Left Arm, Patient Position: Sitting, Cuff Size: Normal)   Pulse 72   Ht 5\' 10"  (1.778 m)   Wt 233 lb 12.8 oz (106.1 kg)   SpO2 98%   BMI 33.55 kg/m   Body mass index is 33.55 kg/m.  Tobacco History  Smoking Status  . Former Smoker  . Packs/day: 2.00  . Years: 40.00  . Types: Cigarettes  . Quit date: 02/15/2005  Smokeless Tobacco  . Never Used     Counseling given: No   Past Medical History:  Diagnosis Date  . ANXIETY   . Arthritis    low back  . Bladder stone   . BPH (benign prostatic hypertrophy)   . Carotid artery occlusion   . 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 CVD (cerebrovascular disease)   . Eczema   . GERD (gastroesophageal reflux disease)   . History of carotid artery stenosis    S/P BILATERAL CEA  . 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 Hyperlipidemia   . Hypertension     . Nocturia   . S/P carotid endarterectomy    BILATERAL ICA--  PATENT PER DUPLEX  05-19-2012  . Stroke [facility]) 1996   pt states no weaknes but has severe anxiety  . Urinary frequency   . Vitamin D deficiency    Past Surgical History:  Procedure Laterality Date  . APPENDECTOMY  AS CHILD  . CARDIOVASCULAR STRESS TEST  03-27-2012  DR CRENSHAW   LOW RISK LEXISCAN STUDY-- PROBABLE NORMAL PERFUSION AND SOFT TISSUE ATTENUATION/  NO ISCHEMIA/ EF 51%  . CAROTID ENDARTERECTOMY Bilateral LEFT  11-12-2008  DR GREG HAYES   RIGHT ICA  2006  ([facility])  . CYSTOSCOPY WITH LITHOLAPAXY N/A 02/26/2013   Procedure: CYSTOSCOPY WITH LITHOLAPAXY;  Surgeon: Franchot Gallo, MD;  Location: [facility];  Service: Urology;  Laterality: N/A;  . EYE SURGERY  Jan. 2016   cataract surgery both eyes  . INGUINAL HERNIA REPAIR Right 11-08-2006  . MASS EXCISION N/A 03/03/2016   Procedure: EXCISION OF BACK  MASS;  Surgeon: Stark Klein, MD;  Location: [facility];  Service: General;  Laterality: N/A;  . 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 PROSTATE SURGERY    . TRANSURETHRAL RESECTION OF PROSTATE N/A 02/26/2013   Procedure: TRANSURETHRAL RESECTION OF THE PROSTATE WITH GYRUS  INSTRUMENTS;  Surgeon: Franchot Gallo, MD;  Location[facility];  Service: Urology;  Laterality: N/A;   Family History  Problem Relation Age of Onset  . Heart disease Mother     CHF  . Bipolar disorder Mother   . Heart disease Father     CHF   History  Sexual Activity  . Sexual activity: Yes  . Partners: Female    Outpatient Encounter Prescriptions as of 06/22/2016  Medication Sig  . aspirin 325 MG EC tablet Take 325 mg by mouth daily.  . B Complex Vitamins (VITAMIN B COMPLEX PO) Take by mouth daily.    . Calcium Carb-Cholecalciferol (CALCIUM 1000 + D PO) Take by mouth daily.    . carbamazepine (TEGRETOL-XR) 100 MG 12 hr tablet 1 qam  1  qhs  . Cholecalciferol (VITAMIN D) 2000  UNITS CAPS Take by mouth.  . Eszopiclone 3 MG TABS TAKE 1 TABLET BY MOUTH AT BEDTIME AS NEEDED TAKE IMMEDIATELY BEFORE BEDTIME  . fenofibrate 160 MG tablet TAKE 1 TABLET (160 MG TOTAL) BY MOUTH DAILY. REPEAT LABS ARE DUE NOW  . Flaxseed, Linseed, (FLAX SEED OIL PO) Take by mouth daily.    [name] levocetirizine (XYZAL) 5 MG tablet Take 1 tablet (5 mg total) by mouth every evening. (Patient taking differently: Take 5 mg by mouth daily as needed. )  . lisinopril (PRINIVIL,ZESTRIL) 20 MG tablet Take 1 tablet (20 mg total) by mouth daily.  [name] LORazepam (ATIVAN) 0.5 MG tablet 2 bid  1  qday  prn  . Multiple Vitamin (MULTIVITAMIN) tablet Take 1 tablet by mouth daily.  [name] NEXIUM 40 MG capsule TAKE 1 CAPSULE BY MOUTH ONCE DAILY AS NEEDED  . rosuvastatin (CRESTOR) 40 MG tablet Take 1 tablet (40 mg total) by mouth daily.  . tamsulosin (FLOMAX) 0.4 MG CAPS capsule 1 po qd  . [DISCONTINUED] amoxicillin-clavulanate (AUGMENTIN) 875-125 MG tablet Take 1 tablet by mouth 2 (two) times daily.  . [DISCONTINUED] fluticasone (FLONASE) 50 MCG/ACT nasal spray Place 2 sprays into both nostrils daily.   No facility-administered encounter medications on file as of 06/22/2016.     Activities of Daily Living In your present state of health, do you have any difficulty performing the following activities: 06/22/2016 07/17/2015  Hearing? N N  Vision? N N  Difficulty concentrating or making decisions? N N  Walking or climbing stairs? N N  Dressing or bathing? N N  Doing errands, shopping? N Y  Conservation officer, nature and eating ? N -  Using the Toilet? N -  In the past six months, have you accidently leaked urine? N -  Do you have problems with loss of bowel control? N -  Managing your Medications? N -  Managing your Finances? N -  Housekeeping or managing your Housekeeping? N -  Some recent data might be hidden    Patient Care Team: Ann Held, DO as PCP - General Lelon Perla, MD as Attending Physician  (Cardiology) Norma Fredrickson, MD as Consulting Physician (Psychiatry) [name], MD (Dermatology) Franchot Gallo, MD as Consulting Physician (Urology)   Assessment:    Physical assessment deferred to PCP.  Exercise Activities and Dietary recommendations Exercise limited by: None identified Diet (meal preparation, eat out, water intake, caffeinated beverages, dairy products, fruits and vegetables): well balanced, on average, 3 meals per day Breakfast: Toast, eggs,coffee Lunch: left overs Dinner: meat, potatoes, salad      Goals    . Maintain healthy lifestyle  Fall Risk Fall Risk  06/22/2016 07/17/2015 06/27/2014 06/25/2013  Falls in the past year? No Yes No No  Number falls in past yr: - 2 or more - -  Injury with Fall? - Yes - -  Risk Factor Category  - High Fall Risk - -   Depression Screen PHQ 2/9 Scores 06/22/2016 07/17/2015 06/27/2014 06/25/2013  PHQ - 2 Score 0 0 0 0    Cognitive Function MMSE - Mini Mental State Exam 06/22/2016  Orientation to time 5  Orientation to Place 5  Registration 3  Attention/ Calculation 5  Recall 3  Language- name 2 objects 2  Language- repeat 1  Language- follow 3 step command 3  Language- read & follow direction 1  Write a sentence 1  Copy design 0  Total score 29        Immunization History  Administered Date(s) Administered  . Influenza Split 03/23/2011, 03/13/2012  . Influenza Whole 04/20/2007, 03/01/2008, 04/01/2009, 03/27/2010  . Influenza,inj,Quad PF,36+ Mos 04/03/2013, 03/06/2014, 03/03/2015, 04/20/2016  . Pneumococcal Conjugate-13 06/25/2013  . Pneumococcal Polysaccharide-23 05/30/2009  . Tdap 10/19/2011  . Zoster 05/30/2009   Screening Tests Health Maintenance  Topic Date Due  . COLONOSCOPY  02/27/2020  . TETANUS/TDAP  10/18/2021  . INFLUENZA VACCINE  Completed  . ZOSTAVAX  Completed  . PNA vac Low Risk Adult  Completed      Plan:     Eat a heart healthy diet (full of fruits, vegetables, whole grains, lean  protein, water--limit salt, fat, and sugar intake).  Keep up the great work at [employer]!!  Do brain stimulating activities (puzzles, reading, adult coloring books, staying active) to keep memory sharp.   Bring a copy of your advance directives to your next office visit.  During the course of the visit the patient was educated and counseled about the following appropriate screening and preventive services:   Vaccines to include Pneumoccal, Influenza, Hepatitis B, Td, Zostavax, HCV  Cardiovascular Disease  Colorectal cancer screening  Diabetes screening  Prostate Cancer Screening  Glaucoma screening  Nutrition counseling   Patient Instructions (the written plan) was given to the patient.    Shela Nevin, [place]  06/22/2016  Reviewed--- 07/02/2016  Early Osmond, DO

## 2016-06-22 ENCOUNTER — Ambulatory Visit (INDEPENDENT_AMBULATORY_CARE_PROVIDER_SITE_OTHER): Payer: PPO | Admitting: *Deleted

## 2016-06-22 ENCOUNTER — Encounter: Payer: Self-pay | Admitting: *Deleted

## 2016-06-22 ENCOUNTER — Ambulatory Visit: Primary: Internal Medicine

## 2016-06-22 VITALS — BP 144/75 | HR 72 | Ht 70.0 in | Wt 233.8 lb

## 2016-06-22 DIAGNOSIS — Z0000 Encounter for general adult medical examination without abnormal findings: Secondary | ICD-10-CM

## 2016-06-22 NOTE — Patient Instructions (Addendum)
Eat a heart healthy diet (full of fruits, vegetables, whole grains, lean protein, water--limit salt, fat, and sugar intake).  Keep up the great work at [employer]!!  Do brain stimulating activities (puzzles, reading, adult coloring books, staying active) to keep memory sharp.   Bring a copy of your advance directives to your next office visit.   Health Maintenance, Male A healthy lifestyle and preventative care can promote health and wellness.  Maintain regular health, dental, and eye exams.  Eat a healthy diet. Foods like vegetables, fruits, whole grains, low-fat dairy products, and lean protein foods contain the nutrients you need and are low in calories. Decrease your intake of foods high in solid fats, added sugars, and salt. Get information about a proper diet from your health care provider, if necessary.  Regular physical exercise is one of the most important things you can do for your health. Most adults should get at least 150 minutes of moderate-intensity exercise (any activity that increases your heart rate and causes you to sweat) each week. In addition, most adults need muscle-strengthening exercises on 2 or more days a week.   Maintain a healthy weight. The body mass index (BMI) is a screening tool to identify possible weight problems. It provides an estimate of body fat based on height and weight. Your health care provider can find your BMI and can help you achieve or maintain a healthy weight. For males 20 years and older:  A BMI below 18.5 is considered underweight.  A BMI of 18.5 to 24.9 is normal.  A BMI of 25 to 29.9 is considered overweight.  A BMI of 30 and above is considered obese.  Maintain normal blood lipids and cholesterol by exercising and minimizing your intake of saturated fat. Eat a balanced diet with plenty of fruits and vegetables. Blood tests for lipids and cholesterol should begin at age 47 and be repeated every 5 years. If your lipid or cholesterol  levels are high, you are over age 74, or you are at high risk for heart disease, you may need your cholesterol levels checked more frequently.Ongoing high lipid and cholesterol levels should be treated with medicines if diet and exercise are not working.  If you smoke, find out from your health care provider how to quit. If you do not use tobacco, do not start.  Lung cancer screening is recommended for adults aged 67-80 years who are at high risk for developing lung cancer because of a history of smoking. A yearly low-dose CT scan of the lungs is recommended for people who have at least a 30-pack-year history of smoking and are current smokers or have quit within the past 15 years. A pack year of smoking is smoking an average of 1 pack of cigarettes a day for 1 year (for example, a 30-pack-year history of smoking could mean smoking 1 pack a day for 30 years or 2 packs a day for 15 years). Yearly screening should continue until the smoker has stopped smoking for at least 15 years. Yearly screening should be stopped for people who develop a health problem that would prevent them from having lung cancer treatment.  If you choose to drink alcohol, do not have more than 2 drinks per day. One drink is considered to be 12 oz (360 mL) of beer, 5 oz (150 mL) of wine, or 1.5 oz (45 mL) of liquor.  Avoid the use of street drugs. Do not share needles with anyone. Ask for help if you need support  or instructions about stopping the use of drugs.  High blood pressure causes heart disease and increases the risk of stroke. High blood pressure is more likely to develop in:  People who have blood pressure in the end of the normal range (100-139/85-89 mm Hg).  People who are overweight or obese.  People who are African American.  If you are 61-23 years of age, have your blood pressure checked every 3-5 years. If you are 58 years of age or older, have your blood pressure checked every year. You should have your blood  pressure measured twice-once when you are at a hospital or clinic, and once when you are not at a hospital or clinic. Record the average of the two measurements. To check your blood pressure when you are not at a hospital or clinic, you can use:  An automated blood pressure machine at a pharmacy.  A home blood pressure monitor.  If you are 57-42 years old, ask your health care provider if you should take aspirin to prevent heart disease.  Diabetes screening involves taking a blood sample to check your fasting blood sugar level. This should be done once every 3 years after age 19 if you are at a normal weight and without risk factors for diabetes. Testing should be considered at a younger age or be carried out more frequently if you are overweight and have at least 1 risk factor for diabetes.  Colorectal cancer can be detected and often prevented. Most routine colorectal cancer screening begins at the age of 19 and continues through age 7. However, your health care provider may recommend screening at an earlier age if you have risk factors for colon cancer. On a yearly basis, your health care provider may provide home test kits to check for hidden blood in the stool. A small camera at the end of a tube may be used to directly examine the colon (sigmoidoscopy or colonoscopy) to detect the earliest forms of colorectal cancer. Talk to your health care provider about this at age 72 when routine screening begins. A direct exam of the colon should be repeated every 5-10 years through age 66, unless early forms of precancerous polyps or small growths are found.  People who are at an increased risk for hepatitis B should be screened for this virus. You are considered at high risk for hepatitis B if:  You were born in a country where hepatitis B occurs often. Talk with your health care provider about which countries are considered high risk.  Your parents were born in a high-risk country and you have not  received a shot to protect against hepatitis B (hepatitis B vaccine).  You have HIV or AIDS.  You use needles to inject street drugs.  You live with, or have sex with, someone who has hepatitis B.  You are a man who has sex with other men (MSM).  You get hemodialysis treatment.  You take certain medicines for conditions like cancer, organ transplantation, and autoimmune conditions.  Hepatitis C blood testing is recommended for all people born from [birth year] through 1963 and any individual with known risk factors for hepatitis C.  Healthy men should no longer receive prostate-specific antigen (PSA) blood tests as part of routine cancer screening. Talk to your health care provider about prostate cancer screening.  Testicular cancer screening is not recommended for adolescents or adult males who have no symptoms. Screening includes self-exam, a health care provider exam, and other screening tests. Consult with your health  care provider about any symptoms you have or any concerns you have about testicular cancer.  Practice safe sex. Use condoms and avoid high-risk sexual practices to reduce the spread of sexually transmitted infections (STIs).  You should be screened for STIs, including gonorrhea and chlamydia if:  You are sexually active and are younger than 24 years.  You are older than 24 years, and your health care provider tells you that you are at risk for this type of infection.  Your sexual activity has changed since you were last screened, and you are at an increased risk for chlamydia or gonorrhea. Ask your health care provider if you are at risk.  If you are at risk of being infected with HIV, it is recommended that you take a prescription medicine daily to prevent HIV infection. This is called pre-exposure prophylaxis (PrEP). You are considered at risk if:  You are a man who has sex with other men (MSM).  You are a heterosexual man who is sexually active with multiple  partners.  You take drugs by injection.  You are sexually active with a partner who has HIV.  Talk with your health care provider about whether you are at high risk of being infected with HIV. If you choose to begin PrEP, you should first be tested for HIV. You should then be tested every 3 months for as long as you are taking PrEP.  Use sunscreen. Apply sunscreen liberally and repeatedly throughout the day. You should seek shade when your shadow is shorter than you. Protect yourself by wearing long sleeves, pants, a wide-brimmed hat, and sunglasses year round whenever you are outdoors.  Tell your health care provider of new moles or changes in moles, especially if there is a change in shape or color. Also, tell your health care provider if a mole is larger than the size of a pencil eraser.  A one-time screening for abdominal aortic aneurysm (AAA) and surgical repair of large AAAs by ultrasound is recommended for men aged 34-75 years who are current or former smokers.  Stay current with your vaccines (immunizations). This information is not intended to replace advice given to you by your health care provider. Make sure you discuss any questions you have with your health care provider. Document Released: 11/27/2007 Document Revised: 06/21/2014 Document Reviewed: 03/04/2015 Elsevier Interactive Patient Education  2017 [employer].

## 2016-06-28 MED FILL — ZOLPIDEM TARTRATE 10 MG TAB: 10 | 30 days supply | Qty: 30 | Fill #4

## 2016-06-29 MED FILL — CARBAMAZEPINE ER 100 MG TAB: 100 | 30 days supply | Qty: 60 | Fill #4

## 2016-06-30 ENCOUNTER — Inpatient Hospital Stay: Admit: 2016-06-30 | Discharge: 2016-07-01 | Primary: Internal Medicine

## 2016-06-30 DIAGNOSIS — N184 Chronic kidney disease, stage 4 (severe): Principal | ICD-10-CM

## 2016-06-30 DIAGNOSIS — D500 Iron deficiency anemia secondary to blood loss (chronic): Secondary | ICD-10-CM

## 2016-06-30 DIAGNOSIS — D631 Anemia in chronic kidney disease: Secondary | ICD-10-CM

## 2016-06-30 DIAGNOSIS — N186 End stage renal disease: Secondary | ICD-10-CM

## 2016-07-02 ENCOUNTER — Ambulatory Visit: Admit: 2016-07-02 | Discharge: 2016-07-02 | Attending: Hematology & Oncology | Primary: Internal Medicine

## 2016-07-02 DIAGNOSIS — K5730 Diverticulosis of large intestine without perforation or abscess without bleeding: Secondary | ICD-10-CM

## 2016-07-02 DIAGNOSIS — I4891 Unspecified atrial fibrillation: Secondary | ICD-10-CM

## 2016-07-02 DIAGNOSIS — I130 Hypertensive heart and chronic kidney disease with heart failure and stage 1 through stage 4 chronic kidney disease, or unspecified chronic kidney disease: Principal | ICD-10-CM

## 2016-07-02 DIAGNOSIS — I10 Essential (primary) hypertension: Secondary | ICD-10-CM

## 2016-07-02 DIAGNOSIS — E1122 Type 2 diabetes mellitus with diabetic chronic kidney disease: Secondary | ICD-10-CM

## 2016-07-02 DIAGNOSIS — K635 Polyp of colon: Secondary | ICD-10-CM

## 2016-07-02 DIAGNOSIS — Z79891 Long term (current) use of opiate analgesic: Secondary | ICD-10-CM

## 2016-07-02 DIAGNOSIS — N184 Chronic kidney disease, stage 4 (severe): Secondary | ICD-10-CM

## 2016-07-02 DIAGNOSIS — I509 Heart failure, unspecified: Principal | ICD-10-CM

## 2016-07-02 DIAGNOSIS — E119 Type 2 diabetes mellitus without complications: Secondary | ICD-10-CM

## 2016-07-02 DIAGNOSIS — D631 Anemia in chronic kidney disease: Secondary | ICD-10-CM

## 2016-07-02 DIAGNOSIS — F1010 Alcohol abuse, uncomplicated: Secondary | ICD-10-CM

## 2016-07-02 DIAGNOSIS — D500 Iron deficiency anemia secondary to blood loss (chronic): Secondary | ICD-10-CM

## 2016-07-02 DIAGNOSIS — D696 Thrombocytopenia, unspecified: Secondary | ICD-10-CM

## 2016-07-02 DIAGNOSIS — I2510 Atherosclerotic heart disease of native coronary artery without angina pectoris: Secondary | ICD-10-CM

## 2016-07-02 DIAGNOSIS — I6350 Cerebral infarction due to unspecified occlusion or stenosis of unspecified cerebral artery: Secondary | ICD-10-CM

## 2016-07-02 DIAGNOSIS — E785 Hyperlipidemia, unspecified: Secondary | ICD-10-CM

## 2016-07-02 DIAGNOSIS — I639 Cerebral infarction, unspecified: Secondary | ICD-10-CM

## 2016-07-02 DIAGNOSIS — Z79899 Other long term (current) drug therapy: Secondary | ICD-10-CM

## 2016-07-02 MED ORDER — SODIUM CHLORIDE 0.9 % IV SOLN
Freq: Once | INTRAVENOUS | Status: CN
Start: 2016-07-02 — End: ?

## 2016-07-02 MED ORDER — SODIUM CHLORIDE FLUSH 0.9 % IV SOLN
10 mL | Status: CN | PRN
Start: 2016-07-02 — End: ?

## 2016-07-02 MED ORDER — DIPHENHYDRAMINE HCL 50 MG/ML IJ SOLN
25 mg | INTRAVENOUS | Status: CN | PRN
Start: 2016-07-02 — End: ?

## 2016-07-02 MED ORDER — TAMSULOSIN HCL 0.4 MG PO CAPS: Start: 2016-07-02 — End: 2017-01-14

## 2016-07-02 MED ORDER — HEPARIN SODIUM LOCK FLUSH 100 UNIT/ML IV SOLN
500 [IU] | Status: CN | PRN
Start: 2016-07-02 — End: ?

## 2016-07-02 MED ORDER — DEXAMETHASONE SODIUM PHOSPHATE 20 MG/5ML IJ SOLN
20 mg | INTRAVENOUS | Status: CN | PRN
Start: 2016-07-02 — End: ?

## 2016-07-02 NOTE — Progress Notes
orthopedics and pain management. The patient received dose 2 of Injectafer in February 2016, July 2016, Septmeber 2016 and February 2017.   ??  The patient underwent a bone marrow biopsy and aspirate 04/28/15 to evaluate for possible MDS. This revealed a hyperplastic marrow (65% cellularity) with mild myeloid hyperplasia and erythroid hyperplasia. Flow cytometry showed no evidence of acute leukemia, B-cell lymphproliferative process or T-cell aberrancy. Cytogenetics: 81 XY karyotype. FISH analysis: Normal results seen for 3 every week, 5, 7, 8, 11(MLL), 12(ETV6), 17, 19, 20. This was interpreted as a normal result. In the absence of a cytogenetic/fish abnormality, the changes are likely reactive. Clinical correlation and followup are recommended.  ??  The patient received a transfusion in November 2016 and has received several doses of intravenous iron. The patient states that he has elected not to return to the GI physicians as he is unwilling to undergo any additional GI evaluations.    The patient recently completed additional IV iron infusions. We have reviewed the recent laboratory results. There was improvement in the hemoglobin and iron indices however, the percent saturation remains less than 20. The iron profile was obtained between the 2 doses of IV iron. I would recommend repeating the iron profile today. When the percent saturation is >/= 20, then we can resume Procrit therapy.  The patient informs me that he self discontinued his blood pressure medications. The patient was strongly encouraged to speak with his PCP in this regard.  Past Surgical History:   Past Surgical History:   Procedure Laterality Date   ? APPENDECTOMY     ? CORONARY ANGIOPLASTY WITH STENT PLACEMENT      x 2       Social History:    Social History     Social History   ? Marital status: Divorced     Spouse name: N/A   ? Number of children: N/A   ? Years of education: N/A     Occupational History   ? Not on file.

## 2016-07-02 NOTE — Progress Notes
Ears, Nose, Throat, Neck, and Mouth: Trachea Midline, No JVD, No Lymphadenopathy, Thyroid midline-normal, Neck supple and No oral lesions  Cardiovascular:S1,S2 no murmurs and Regular heart beat  Chest:Symmetrical, No kyphosis and No scoliosis  Respiratory:Clear, No rales/Rhonchi  Gastrointestinal:Abdomen soft, Abdomen non-tender, Abdomen non-distended, Abdomen without masses, No ascites and No hepatosplenomegaly  Hematologic/Lymphatic:No petechiae, No purpura, No neck lymphadenopathy, No Axillary lymphadenopathy and No Groin lymphadenopathy  Musculoskeletal: ambulates without assistance  Extremities: 1+ edema BLE, No cyanosis, No digital clubbing and No discoloration, support stockings BLE  Neurologic:Alert and oriented, No Hemiparesis and No motor deficits    Assessment:  Jeffery Gonzales is a 73?year old male who was referred to the hematology office for an evaluation of anemia. This was most consistent with iron deficiency anemia. The patient completed a GI evaluation which did not reveal a specific source of bleeding. However, I suspect that the patient likely has had chronic GI blood loss resulting in the anemia. The patient has a history of prior GI blood loss which was exacerbated by Xarelto. The patient was on anticoagulation due to the congestive heart failure and atrial fibrillation however this has since been discontinued. The patient remains on aspirin therapy alone.  ??  The patient denies any overt bleeding issues. There were no findings of B12 deficiency, folate deficiency, or hemolysis. The bone marrow biopsy did not show evidence of leukemia, lymphoma, myelodysplasia, or myelofibrosis. The changes noted in the bone marrow were felt consistent with a reactive process. 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 ?  The patient completed an additional course of parenteral iron. There has

## 2016-07-02 NOTE — Progress Notes
Patient Name: Jeffery Gonzales    Date: 07/02/2016    Date Of Birth: 10/13/1940    History of Present Illness:  Jeffery Gonzales is a 44?year old Caucasian male who was referred to the hematology office for an evaluation of anemia. The patient has undergone an evaluation by Dr. Carolina Cellar at [facility] due to iron deficiency anemia. The patient has a history of a small bowel GI bleed in 2013 and a second episode in February 2015 most recently occurring in the setting of Xarelto use. The patient has required several transfusions in the past. The patient denies any recent evidence of bleeding specifically no bright red blood per rectum or black stools. He underwent a colonoscopy exam 10/17/2013 which revealed a 3 mm sessile polyp in the descending colon which was removed, a 3 mm sessile polyp in the descending colon which was removed, a diminutive sessile polyp in the sigmoid colon which was removed, severe diverticulosis and otherwise a normal exam. An upper endoscopy obtained 10/17/2013 revealed patchy erythema in the antrum of the stomach but otherwise a normal exam. A capsule endoscopy obtained 10/18/2013 revealed a small erythematous spot in the proximal-mi jejunum without stigmata for bleeding. The remainder of the small bowel exam was normal. As there were no overt bleeding sites identified on the GI evaluation the patient was referred to the hematology office for additional assessment of the anemia and to evaluate for a possible myelodysplasia.  ??  The patient has a significant history of coronary artery disease, diabetes, congestive heart failure, hypertension, hyperlipidemia. He states that he fell and required hip surgery in March 2015.  ??  The patient received a dose of Injectafer 06/18/2014 but did not return the following week to receive cycle 2. The patient states that he had been suffering from persistent pain in the hip and is being evaluated by

## 2016-07-02 NOTE — Progress Notes
The past/family/social history and review of systems remains accurate as of today's date 07/02/2016.    Medications:   Current Outpatient Prescriptions:   ?  atorvastatin (LIPITOR) 40 MG tablet, Take 1 Tablet by mouth nightly., Disp: 30 Tablet, Rfl: 12  ?  clopidogrel (PLAVIX) 75 MG tablet, Take 1 Tablet by mouth daily., Disp: 30 Tablet, Rfl: 12  ?  furosemide (LASIX) 40 MG tablet, Take 1 Tablet by mouth 2 times daily., Disp: 30 Tablet, Rfl: 12  ?  oxyCODONE-acetaminophen (PERCOCET) 10-325 MG Tablet, Take 10-325 tablets by mouth daily., Disp: , Rfl:   ?  STOOL SOFTENER LAXATIVE DC 240 MG Capsule, Take 240 mg by mouth daily., Disp: , Rfl:   ?  tamsulosin (FLOMAX) 0.4 MG PO Capsule, , Disp: , Rfl:       Labs:   Hospital Outpatient Visit on 06/30/2016   Component Date Value   ? WBC 06/30/2016 5.37    ? RBC 06/30/2016 3.68    ? Hemoglobin 06/30/2016 10.6*   ? Hematocrit 06/30/2016 34.7*   ? MCV 06/30/2016 94.3    ? [place] 06/30/2016 28.8    ? MCHC 06/30/2016 30.5    ? RDW 06/30/2016 19.0    ? Platelet Count 06/30/2016 108*   ? MPV 06/30/2016 9.6    ? Neutrophils Absolute 06/30/2016 3.92    ? Neutrophils % 06/30/2016 73.0    ? Lymphocytes Absolute 06/30/2016 0.55    ? Lymphocytes % 06/30/2016 10.2*   ? Monocytes Absolute 06/30/2016 0.52    ? Monocytes % 06/30/2016 9.7    ? Eosinophils Absolute 06/30/2016 0.36    ? Eosinophils % 06/30/2016 6.7    ? Basophil Absolute 06/30/2016 0.02    ? BASOPHILS 06/30/2016 0.4    Hospital Outpatient Visit on 06/08/2016   Component Date Value   ? Ferritin 06/08/2016 241.4    ? WBC 06/08/2016 4.26*   ? RBC 06/08/2016 2.99    ? Hemoglobin 06/08/2016 8.1*   ? Hematocrit 06/08/2016 27.2*   ? MCV 06/08/2016 91.0    ? [facility] 06/08/2016 27.1    ? MCHC 06/08/2016 29.8    ? RDW 06/08/2016 22.3    ? Platelet Count 06/08/2016 92*   ? MPV 06/08/2016 8.6    ? Neutrophils Absolute 06/08/2016 3.19    ? Neutrophils % 06/08/2016 74.8    ? Lymphocytes Absolute 06/08/2016 0.31    ? Lymphocytes % 06/08/2016 7.3*

## 2016-07-02 NOTE — Progress Notes
?   Monocytes Absolute 06/08/2016 0.42    ? Monocytes % 06/08/2016 9.9    ? Eosinophils Absolute 06/08/2016 0.32    ? Eosinophils % 06/08/2016 7.5    ? Basophil Absolute 06/08/2016 0.02    ? BASOPHILS 06/08/2016 0.5    ? Iron 06/08/2016 47    ? Transferrin 06/08/2016 215    ? TIBC 06/08/2016 273    ? Iron Saturation 06/08/2016 17*   Office Visit on 05/11/2016   Component Date Value   ? Vitamin B-12 05/11/2016 786    ? Ferritin 05/11/2016 22.9*   ? Folate 05/11/2016 19.8    ? Sodium 05/11/2016 138    ? Potassium 05/11/2016 4.6    ? Chloride 05/11/2016 100*   ? CO2 05/11/2016 24    ? Urea Nitrogen 05/11/2016 29*   ? Creatinine 05/11/2016 1.23*   ? BUN/Creatinine Ratio 05/11/2016 23.6*   ? Glucose 05/11/2016 161*   ? Calcium 05/11/2016 8.7    ? Osmolality Calc 05/11/2016 285.0    ? Anion Gap 05/11/2016 14    ? EGFR 05/11/2016 58    ? WBC 05/11/2016 5.02    ? RBC 05/11/2016 3.12    ? Hemoglobin 05/11/2016 7.9*   ? Hematocrit 05/11/2016 26.5*   ? MCV 05/11/2016 84.9    ? [facility] 05/11/2016 25.3    ? MCHC 05/11/2016 29.8    ? RDW 05/11/2016 16.5    ? Platelet Count 05/11/2016 129*   ? MPV 05/11/2016 9.5    ? Neutrophils Absolute 05/11/2016 3.34    ? Neutrophils % 05/11/2016 66.4    ? Lymphocytes Absolute 05/11/2016 0.56    ? Lymphocytes % 05/11/2016 11.2*   ? Monocytes Absolute 05/11/2016 0.55    ? Monocytes % 05/11/2016 11.0    ? Eosinophils Absolute 05/11/2016 0.56    ? Eosinophils % 05/11/2016 11.2    ? Basophil Absolute 05/11/2016 0.01    ? BASOPHILS 05/11/2016 0.2    ? Iron 05/11/2016 30*   ? Transferrin 05/11/2016 285    ? TIBC 05/11/2016 362    ? Iron Saturation 05/11/2016 8*         Imaging Results:     Health Maintenance: Colonoscopy:2015.    Physical Exam:   General: Well developed, well nourished, No acute Distress, Gentleman patient and Elderly appearance  Head:Atraumatic and normocephalic  Eyes:PERRLA, EOMS intact, No jaundice, pallor and Conjunctiva clear

## 2016-07-02 NOTE — Patient Instructions
Labs today/ No procirt  F/U in 2 weeks with cbc and procrit

## 2016-07-02 NOTE — Progress Notes
Social History Main Topics   ? Smoking status: Never Smoker   ? Smokeless tobacco: Not on file   ? Alcohol use 216.0 oz/week     18 Cans of beer per week   ? Drug use: No   ? Sexual activity: No     Other Topics Concern   ? Not on file     Social History Narrative       Family History:    Family History   Problem Relation Age of Onset   ? Diabetes Mother    ? Cancer Father        Vital Signs:   Vitals:    07/02/16 1155   BP: 149/85   Pulse: 115   Resp: 18   Weight: 103 kg (227 lb)   Height: 1.778 m (5\' 10" )       Allergies: No known drug allergy    Review of Systems:  Constitutional:?No weight loss  Fatigue-improved  No loss of appetite  No night sweats  No fever  No chills?  Weight loss due to diuresis  Eyes:?No blurred vision  No double vision  No difficulty seeing  Ears,Nose, Mouth,Throat: No hearing loss  No ringing in ears  No sinus trouble  No difficulty swallowing  No sore throat  No nasal drainage  No nose bleeds  No hoarseness  Cardiac:No heart papitations  No lightheadedness  Swelling in legs: improved  Respiratory:No cough  No sputum production  No hemoptysis  No shortness of breath  No orthopnea  Gastrointestinal:No nausea  No vomiting  No heartburn  No constipation  No diarrhea  No abdominal pain  No rectal bleeding  No Bowel incontinence  Genitourinary:No burning on urination  No pain with urination  No blood in urine  No frequent urination  No urinary incontinence  Musculosketal:No muscle pain  No stiffness  No joint pain  No joint swelling  No back pain  Skin:No skin rash  Neurological:No seizures  No dizziness  No loss of balance  No weakness of limbs  No loss of sensation  Tingling sensations bilateral hands  No memory loss  No difficulty talking  Psychiatric:No nervousness  No depression  No restlessness  No difficulty sleeping  Hematologic/Lymphatic/Immunologic:No bleeding  No lumps in arm pits  No lumps in neck  No lumps in

## 2016-07-02 NOTE — Progress Notes
been an improvement in the hemoglobin in the iron indices. However, the percent iron saturation remains less than 20. We will repeat the iron profile today. We have discussed that the patient does not currently qualify for ESA therapy as the percent iron saturation is less than 20 and hemoglobin exceeds the target goal of 10 g/dL.    Recommendation/Plan:  1. We will hold?Procrit 20,000 units subcutaneous every 2 weeks.  2. We will obtain an iron profile today.  3. The patient will return for an exam, CBC and possible Procrit injection in 2 weeks.  4. As always, Jeffery Gonzales understands that he may return to the office sooner than the scheduled appointment if any questions or concerns arise during the interim.    Return to Clinic: Return in about 2 weeks (around 07/16/2016).

## 2016-07-03 NOTE — Addendum Note
Addended by: Maple Mirza on: 07/02/2016 07:28 PM      Modules accepted: Orders

## 2016-07-06 ENCOUNTER — Encounter: Primary: Internal Medicine

## 2016-07-16 ENCOUNTER — Ambulatory Visit: Admit: 2016-07-16 | Discharge: 2016-07-16 | Attending: Hematology & Oncology | Primary: Internal Medicine

## 2016-07-16 DIAGNOSIS — Z86010 Personal history of colonic polyps: Secondary | ICD-10-CM

## 2016-07-16 DIAGNOSIS — E119 Type 2 diabetes mellitus without complications: Secondary | ICD-10-CM

## 2016-07-16 DIAGNOSIS — I639 Cerebral infarction, unspecified: Secondary | ICD-10-CM

## 2016-07-16 DIAGNOSIS — N184 Chronic kidney disease, stage 4 (severe): Secondary | ICD-10-CM

## 2016-07-16 DIAGNOSIS — K5730 Diverticulosis of large intestine without perforation or abscess without bleeding: Secondary | ICD-10-CM

## 2016-07-16 DIAGNOSIS — I4891 Unspecified atrial fibrillation: Secondary | ICD-10-CM

## 2016-07-16 DIAGNOSIS — D631 Anemia in chronic kidney disease: Secondary | ICD-10-CM

## 2016-07-16 DIAGNOSIS — I10 Essential (primary) hypertension: Secondary | ICD-10-CM

## 2016-07-16 DIAGNOSIS — E785 Hyperlipidemia, unspecified: Secondary | ICD-10-CM

## 2016-07-16 DIAGNOSIS — D696 Thrombocytopenia, unspecified: Secondary | ICD-10-CM

## 2016-07-16 DIAGNOSIS — I509 Heart failure, unspecified: Secondary | ICD-10-CM

## 2016-07-16 DIAGNOSIS — I110 Hypertensive heart disease with heart failure: Secondary | ICD-10-CM

## 2016-07-16 DIAGNOSIS — D649 Anemia, unspecified: Principal | ICD-10-CM

## 2016-07-16 DIAGNOSIS — Z79899 Other long term (current) drug therapy: Secondary | ICD-10-CM

## 2016-07-16 DIAGNOSIS — I6350 Cerebral infarction due to unspecified occlusion or stenosis of unspecified cerebral artery: Secondary | ICD-10-CM

## 2016-07-16 DIAGNOSIS — D500 Iron deficiency anemia secondary to blood loss (chronic): Secondary | ICD-10-CM

## 2016-07-16 DIAGNOSIS — I2510 Atherosclerotic heart disease of native coronary artery without angina pectoris: Secondary | ICD-10-CM

## 2016-07-16 DIAGNOSIS — F1010 Alcohol abuse, uncomplicated: Secondary | ICD-10-CM

## 2016-07-16 DIAGNOSIS — Z7982 Long term (current) use of aspirin: Secondary | ICD-10-CM

## 2016-07-16 MED ORDER — EPOETIN ALFA 20000 UNIT/ML IJ SOLN
20000 [IU] | Freq: Once | SUBCUTANEOUS | Status: CN
Start: 2016-07-16 — End: ?

## 2016-07-16 NOTE — Progress Notes
Neurologic:Alert and oriented, No Hemiparesis and No motor deficits    Assessment:  Jeffery Gonzales is a 89?year old male who was referred to the hematology office for an evaluation of anemia. 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 The patient completed a GI evaluation which did not reveal a specific source of bleeding. However, I suspect that the patient likely has had chronic GI blood loss resulting in the anemia. The patient has a history of prior GI blood loss which was exacerbated by Xarelto. The patient was on anticoagulation due to the congestive heart failure and atrial fibrillation however this has since been discontinued. The patient remains on aspirin therapy alone.  ??  The patient denies any overt bleeding issues. There were no findings of B12 deficiency, folate deficiency, or hemolysis. The bone marrow biopsy did not show evidence of leukemia, lymphoma, myelodysplasia, or myelofibrosis. The changes noted in the bone marrow were felt consistent with a reactive process. 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 ?  The patient completed an additional course of parenteral iron. The iron indices are now adequate to resume ESA therapy. However, the hemoglobin exceeds the target goal of 10 g/dL and therefore the patient does not require ESA therapy at this time.    Recommendation/Plan:  1. We will have the patient return every 2 weeks for a CBC and possible Procrit injection.  2.?The patient will return for an exam, CBC, BMP and possible Procrit injection in 3 months.  3. As always, Jeffery Gonzales understands that he may return to the office sooner than the scheduled appointment if any questions or concerns arise during the interim.    Return to Clinic: Return in about 3 months (around 10/13/2016).

## 2016-07-16 NOTE — Progress Notes
Patient Name: Criston Bokor    Date: 07/16/2016    Date Of Birth: [birth year]    History of Present Illness:  Mr. Czerniak is a 74?year old Caucasian male who was referred to the hematology office for an evaluation of anemia. The patient has undergone an evaluation by Dr. Carolina Cellar at [facility] due to iron deficiency anemia. The patient has a history of a small bowel GI bleed in 2013 and a second episode in February 2015 most recently occurring in the setting of Xarelto use. The patient has required several transfusions in the past. The patient denies any recent evidence of bleeding specifically no bright red blood per rectum or black stools. He underwent a colonoscopy exam 10/17/2013 which revealed a 3 mm sessile polyp in the descending colon which was removed, a 3 mm sessile polyp in the descending colon which was removed, a diminutive sessile polyp in the sigmoid colon which was removed, severe diverticulosis and otherwise a normal exam. An upper endoscopy obtained 10/17/2013 revealed patchy erythema in the antrum of the stomach but otherwise a normal exam. A capsule endoscopy obtained 10/18/2013 revealed a small erythematous spot in the proximal-mi jejunum without stigmata for bleeding. The remainder of the small bowel exam was normal. As there were no overt bleeding sites identified on the GI evaluation the patient was referred to the hematology office for additional assessment of the anemia and to evaluate for a possible myelodysplasia.  ??  The patient has a significant history of coronary artery disease, diabetes, congestive heart failure, hypertension, hyperlipidemia. He states that he fell and required hip surgery in March 2015.  ??  The patient received a dose of Injectafer 06/18/2014 but did not return the following week to receive cycle 2. The patient states that he had been suffering from persistent pain in the hip and is being evaluated by

## 2016-07-16 NOTE — Patient Instructions
No injection today  Procrit every 2 weeks  F/U in 3 months with labs

## 2016-07-16 NOTE — Progress Notes
orthopedics and pain management. The patient received dose 2 of Injectafer in February 2016, July 2016, Septmeber 2016 and February 2017.   ??  The patient underwent a bone marrow biopsy and aspirate 04/28/15 to evaluate for possible MDS. This revealed a hyperplastic marrow (65% cellularity) with mild myeloid hyperplasia and erythroid hyperplasia. Flow cytometry showed no evidence of acute leukemia, B-cell lymphproliferative process or T-cell aberrancy. Cytogenetics: 69 XY karyotype. FISH analysis: Normal results seen for 3 every week, 5, 7, 8, 11(MLL), 12(ETV6), 17, 19, 20. This was interpreted as a normal result. In the absence of a cytogenetic/fish abnormality, the changes are likely reactive. Clinical correlation and followup are recommended.  ??  The patient received a transfusion in November 2016 and has received several doses of intravenous iron. The patient states that he has elected not to return to the GI physicians as he is unwilling to undergo any additional GI evaluations.  ?  The patient recently completed additional IV iron infusions. The patient returns and a short-term interval following repeat iron indices. I have reviewed the laboratory studies with the patient and the iron indices are adequate to proceed with ESA therapy. However, the hemoglobin exceeds the target goal of 10 g/dL and therefore the patient does not require ESA therapy today.    Past Surgical History:   Past Surgical History:   Procedure Laterality Date   ? APPENDECTOMY     ? CORONARY ANGIOPLASTY WITH STENT PLACEMENT      x 2       Social History:    Social History     Social History   ? Marital status: Divorced     Spouse name: N/A   ? Number of children: N/A   ? Years of education: N/A     Occupational History   ? Not on file.     Social History Main Topics   ? Smoking status: Never Smoker   ? Smokeless tobacco: Not on file   ? Alcohol use 216.0 oz/week     18 Cans of beer per week   ? Drug use: No   ? Sexual activity: No

## 2016-07-16 NOTE — Progress Notes
?    clopidogrel (PLAVIX) 75 MG tablet, Take 1 Tablet by mouth daily., Disp: 30 Tablet, Rfl: 12  ?  furosemide (LASIX) 40 MG tablet, Take 1 Tablet by mouth 2 times daily., Disp: 30 Tablet, Rfl: 12  ?  oxyCODONE-acetaminophen (PERCOCET) 10-325 MG Tablet, Take 10-325 tablets by mouth daily., Disp: , Rfl:   ?  STOOL SOFTENER LAXATIVE DC 240 MG Capsule, Take 240 mg by mouth daily., Disp: , Rfl:   ?  tamsulosin (FLOMAX) 0.4 MG PO Capsule, , Disp: , Rfl:       Labs:   Office Visit on 07/16/2016   Component Date Value   ? WBC 07/16/2016 3.99*   ? RBC 07/16/2016 3.70    ? Hemoglobin 07/16/2016 10.9*   ? Hematocrit 07/16/2016 35.3*   ? MCV 07/16/2016 95.4    ? [place] 07/16/2016 29.5    ? MCHC 07/16/2016 30.9    ? RDW 07/16/2016 17.6    ? Platelet Count 07/16/2016 92*   ? MPV 07/16/2016 8.8    ? Neutrophils Absolute 07/16/2016 2.71    ? Neutrophils % 07/16/2016 67.9    ? Lymphocytes Absolute 07/16/2016 0.48    ? Lymphocytes % 07/16/2016 12.0*   ? Monocytes Absolute 07/16/2016 0.42    ? Monocytes % 07/16/2016 10.5    ? Eosinophils Absolute 07/16/2016 0.37    ? Eosinophils % 07/16/2016 9.3    ? Basophil Absolute 07/16/2016 0.01    ? BASOPHILS 07/16/2016 0.3    Office Visit on 07/02/2016   Component Date Value   ? Iron 07/02/2016 64    ? Transferrin 07/02/2016 193*   ? TIBC 07/02/2016 245*   ? Iron Saturation 07/02/2016 26    Hospital Outpatient Visit on 06/30/2016   Component Date Value   ? WBC 06/30/2016 5.37    ? RBC 06/30/2016 3.68    ? Hemoglobin 06/30/2016 10.6*   ? Hematocrit 06/30/2016 34.7*   ? MCV 06/30/2016 94.3    ? [place] 06/30/2016 28.8    ? MCHC 06/30/2016 30.5    ? RDW 06/30/2016 19.0    ? Platelet Count 06/30/2016 108*   ? MPV 06/30/2016 9.6    ? Neutrophils Absolute 06/30/2016 3.92    ? Neutrophils % 06/30/2016 73.0    ? Lymphocytes Absolute 06/30/2016 0.55    ? Lymphocytes % 06/30/2016 10.2*   ? Monocytes Absolute 06/30/2016 0.52    ? Monocytes % 06/30/2016 9.7    ? Eosinophils Absolute 06/30/2016 0.36

## 2016-07-16 NOTE — Progress Notes
Other Topics Concern   ? Not on file     Social History Narrative       Family History:    Family History   Problem Relation Age of Onset   ? Diabetes Mother    ? Cancer Father        Vital Signs:   Vitals:    07/16/16 1050   BP: 144/70   Pulse: 87   Resp: 18   Weight: 103.9 kg (229 lb)   Height: 1.778 m (5\' 10" )       Allergies: No known drug allergy    Review of Systems:  Constitutional:?No weight loss  Fatigue-improved  No loss of appetite  No night sweats  No fever  No chills?  Weight loss due to diuresis  Eyes:?No blurred vision  No double vision  No difficulty seeing  Ears,Nose, Mouth,Throat: No hearing loss  No ringing in ears  No sinus trouble  No difficulty swallowing  No sore throat  No nasal drainage  No nose bleeds  No hoarseness  Cardiac:No heart papitations  No lightheadedness  Swelling in legs: improved  Respiratory:No cough  No sputum production  No hemoptysis  No shortness of breath  No orthopnea  Gastrointestinal:No nausea  No vomiting  No heartburn  No constipation  No diarrhea  No abdominal pain  No rectal bleeding  No Bowel incontinence  Genitourinary:No burning on urination  No pain with urination  No blood in urine  No frequent urination  No urinary incontinence  Musculosketal:No muscle pain  No stiffness  No joint pain  No joint swelling  No back pain  Skin:No skin rash  Neurological:No seizures  No dizziness  No loss of balance  No weakness of limbs  No loss of sensation  Tingling sensations bilateral hands  No memory loss  No difficulty talking  Psychiatric:No nervousness  No depression  No restlessness  No difficulty sleeping  Hematologic/Lymphatic/Immunologic:No bleeding  No lumps in arm pits  No lumps in neck  No lumps in   ?  The past/family/social history and review of systems remains accurate as of today's date 07/16/2016.      Medications:   Current Outpatient Prescriptions:   ?  atorvastatin (LIPITOR) 40 MG tablet, Take 1 Tablet by mouth nightly., Disp: 30 Tablet, Rfl: 12

## 2016-07-16 NOTE — Progress Notes
?   Eosinophils % 06/30/2016 6.7    ? Basophil Absolute 06/30/2016 0.02    ? BASOPHILS 06/30/2016 0.4    Hospital Outpatient Visit on 06/08/2016   Component Date Value   ? Ferritin 06/08/2016 241.4    ? WBC 06/08/2016 4.26*   ? RBC 06/08/2016 2.99    ? Hemoglobin 06/08/2016 8.1*   ? Hematocrit 06/08/2016 27.2*   ? MCV 06/08/2016 91.0    ? [facility] 06/08/2016 27.1    ? MCHC 06/08/2016 29.8    ? RDW 06/08/2016 22.3    ? Platelet Count 06/08/2016 92*   ? MPV 06/08/2016 8.6    ? Neutrophils Absolute 06/08/2016 3.19    ? Neutrophils % 06/08/2016 74.8    ? Lymphocytes Absolute 06/08/2016 0.31    ? Lymphocytes % 06/08/2016 7.3*   ? Monocytes Absolute 06/08/2016 0.42    ? Monocytes % 06/08/2016 9.9    ? Eosinophils Absolute 06/08/2016 0.32    ? Eosinophils % 06/08/2016 7.5    ? Basophil Absolute 06/08/2016 0.02    ? BASOPHILS 06/08/2016 0.5    ? Iron 06/08/2016 47    ? Transferrin 06/08/2016 215    ? TIBC 06/08/2016 273    ? Iron Saturation 06/08/2016 17*         Imaging Results:     Health Maintenance: Colonoscopy:2015    Physical Exam:   General: Well developed, well nourished, No acute Distress, Gentleman patient and Elderly appearance  Head:Atraumatic and normocephalic  Eyes:PERRLA, EOMS intact, No jaundice, pallor and Conjunctiva clear  Ears, Nose, Throat, Neck, and Mouth: Trachea Midline, No JVD, No Lymphadenopathy, Thyroid midline-normal, Neck supple and No oral lesions  Cardiovascular:S1,S2 no murmurs and Regular heart beat  Chest:Symmetrical, No kyphosis and No scoliosis  Respiratory:Clear, No rales/Rhonchi  Gastrointestinal:Abdomen soft, Abdomen non-tender, Abdomen non-distended, Abdomen without masses, No ascites and No hepatosplenomegaly  Hematologic/Lymphatic:No petechiae, No purpura, No neck lymphadenopathy, No Axillary lymphadenopathy and No Groin lymphadenopathy  Musculoskeletal: ambulates without assistance  Extremities: Trace?edema BLE, No cyanosis, No digital clubbing and No discoloration, support stockings BLE

## 2016-07-17 ENCOUNTER — Inpatient Hospital Stay: Admit: 2016-07-17 | Discharge: 2016-07-17 | Primary: Internal Medicine

## 2016-07-19 ENCOUNTER — Other Ambulatory Visit: Payer: Self-pay | Admitting: Family Medicine

## 2016-07-19 DIAGNOSIS — E785 Hyperlipidemia, unspecified: Secondary | ICD-10-CM

## 2016-07-19 MED FILL — LISINOPRIL 20 MG TABLET: 20 | 90 days supply | Qty: 90 | Fill #2

## 2016-07-20 ENCOUNTER — Encounter: Primary: Internal Medicine

## 2016-07-20 MED FILL — ROSUVASTATIN CALCIUM 40 MG: 40 | 90 days supply | Qty: 90 | Fill #0

## 2016-07-29 MED ORDER — EPOETIN ALFA 20000 UNIT/ML IJ SOLN
20000 [IU] | Freq: Once | SUBCUTANEOUS | Status: CN
Start: 2016-07-29 — End: ?

## 2016-07-29 MED FILL — ZOLPIDEM TARTRATE 10 MG TAB: 10 | 30 days supply | Qty: 30 | Fill #5

## 2016-07-29 MED FILL — LORazepam 0.5 MG TABS: 0.5 | 30 days supply | Qty: 150 | Fill #0

## 2016-07-30 ENCOUNTER — Other Ambulatory Visit (HOSPITAL_COMMUNITY): Payer: Self-pay

## 2016-07-30 ENCOUNTER — Ambulatory Visit (INDEPENDENT_AMBULATORY_CARE_PROVIDER_SITE_OTHER): Payer: PPO | Admitting: Psychiatry

## 2016-07-30 ENCOUNTER — Encounter (HOSPITAL_COMMUNITY): Payer: Self-pay | Admitting: Psychiatry

## 2016-07-30 ENCOUNTER — Inpatient Hospital Stay: Admit: 2016-07-30 | Discharge: 2016-07-31 | Primary: Internal Medicine

## 2016-07-30 VITALS — BP 128/76 | HR 67 | Ht 71.0 in | Wt 238.6 lb

## 2016-07-30 DIAGNOSIS — N184 Chronic kidney disease, stage 4 (severe): Principal | ICD-10-CM

## 2016-07-30 DIAGNOSIS — D631 Anemia in chronic kidney disease: Secondary | ICD-10-CM

## 2016-07-30 DIAGNOSIS — Z79899 Other long term (current) drug therapy: Secondary | ICD-10-CM

## 2016-07-30 DIAGNOSIS — F319 Bipolar disorder, unspecified: Secondary | ICD-10-CM

## 2016-07-30 DIAGNOSIS — Z818 Family history of other mental and behavioral disorders: Secondary | ICD-10-CM

## 2016-07-30 DIAGNOSIS — Z9889 Other specified postprocedural states: Secondary | ICD-10-CM

## 2016-07-30 DIAGNOSIS — F0630 Mood disorder due to known physiological condition, unspecified: Secondary | ICD-10-CM | POA: Diagnosis not present

## 2016-07-30 DIAGNOSIS — Z8249 Family history of ischemic heart disease and other diseases of the circulatory system: Secondary | ICD-10-CM

## 2016-07-30 DIAGNOSIS — Z7982 Long term (current) use of aspirin: Secondary | ICD-10-CM

## 2016-07-30 DIAGNOSIS — Z87891 Personal history of nicotine dependence: Secondary | ICD-10-CM

## 2016-07-30 MED ORDER — ESZOPICLONE 3 MG PO TABS: ORAL_TABLET | ORAL | 1 refills | Status: DC

## 2016-07-30 MED ORDER — CARBAMAZEPINE ER 100 MG PO TB12: ORAL_TABLET | ORAL | 8 refills | Status: DC

## 2016-07-30 MED ORDER — LORAZEPAM 0.5 MG PO TABS: ORAL_TABLET | ORAL | 5 refills | Status: DC

## 2016-07-30 MED FILL — CARBAMAZEPINE ER 100 MG TAB: 100 | 30 days supply | Qty: 60 | Fill #5

## 2016-07-30 NOTE — Progress Notes (Signed)
Patient ID: William [name], male   DOB: 03/15/1941, 76 y.o.   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 He is doing very well. His mood is much improved. He went on a cruise and enjoys himself. He is sleeping and eating very well. He's got good energy. He is positive and optimistic. He denies the use of alcohol or drugs. He is no evidence of psychosis. He does describe some memory lapses and even a touch confusion and his wife is even interested in considering going to see a neurologist. After complete understanding I do not believe this is a significant problem and for now we'll simply weight on it. They do decide he wants his neurologist of severe previous neurologist Dr. Leonie Man. The patient's wife says he's doing very well. She's very productive. The patient denies chest pain or shortness of breath. He denies any significant neurological symptoms. 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 Patient is no evidence of being suicidal.  Past Medical History:  Past Medical History:  Diagnosis Date  . ANXIETY   . Arthritis    low back  . Bladder stone   . BPH (benign prostatic hypertrophy)   . Carotid artery occlusion   . 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 CVD (cerebrovascular disease)   . Eczema   . GERD (gastroesophageal reflux disease)   . History of carotid artery stenosis    S/P BILATERAL CEA  . 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 Hyperlipidemia   . Hypertension   . Nocturia   . S/P carotid endarterectomy    BILATERAL ICA--  PATENT PER DUPLEX  05-19-2012  . Stroke [facility]) 1996   pt states no weaknes but has severe anxiety  . Urinary frequency   . Vitamin D deficiency     Past Surgical History:  Procedure Laterality Date  . APPENDECTOMY  AS CHILD  . CARDIOVASCULAR STRESS TEST   03-27-2012  DR CRENSHAW   LOW RISK LEXISCAN STUDY-- PROBABLE NORMAL PERFUSION AND SOFT TISSUE ATTENUATION/  NO ISCHEMIA/ EF 51%  . CAROTID ENDARTERECTOMY Bilateral LEFT  11-12-2008  DR GREG HAYES   RIGHT ICA  2006  ([facility])  . CYSTOSCOPY WITH LITHOLAPAXY N/A 02/26/2013   Procedure: CYSTOSCOPY WITH LITHOLAPAXY;  Surgeon: Franchot Gallo, MD;  Location: [facility];  Service: Urology;  Laterality: N/A;  . EYE SURGERY  Jan. 2016   cataract surgery both eyes  . INGUINAL HERNIA REPAIR Right 11-08-2006  . MASS EXCISION N/A 03/03/2016   Procedure: EXCISION OF BACK  MASS;  Surgeon: Stark Klein, MD;  Location: [place];  Service: General;  Laterality: N/A;  . 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 PROSTATE SURGERY    . TRANSURETHRAL RESECTION OF PROSTATE N/A 02/26/2013   Procedure: TRANSURETHRAL RESECTION OF THE PROSTATE WITH GYRUS INSTRUMENTS;  Surgeon: Franchot Gallo, MD;  Location: [facility];  Service: Urology;  Laterality: N/A;   Family History:  Family History  Problem Relation Age of Onset  . Heart disease Mother     CHF  . Bipolar disorder Mother   . Heart disease Father     CHF   Family Psychiatric  History:  Social History:  History  Alcohol Use  .  0.0 oz/week    Comment: Occasional     History  Drug Use No    Social History   Social History  . Marital status: Married    Spouse name: N/A  . Number of children: 2  . Years of education: N/A   Occupational History  .  Retired   Social History Main Topics  . Smoking status: Former Smoker    Packs/day: 2.00    Years: 40.00    Types: Cigarettes    Quit date: 02/15/2005  . Smokeless tobacco: Never Used  . Alcohol use 0.0 oz/week     Comment: Occasional  . Drug use: No  . Sexual activity: Yes    Partners: Female   Other Topics Concern  . None   Social History Narrative   Exercise--  Engineer, manufacturing everyday   Additional Social History:                          Sleep: Good  Appetite:  Good  Current Medications: Current Outpatient Prescriptions  Medication Sig Dispense Refill  . aspirin 325 MG EC tablet Take 325 mg by mouth daily.    . B Complex Vitamins (VITAMIN B COMPLEX PO) Take by mouth daily.      . Calcium Carb-Cholecalciferol (CALCIUM 1000 + D PO) Take by mouth daily.      . carbamazepine (TEGRETOL-XR) 100 MG 12 hr tablet 1 qam  1  qhs 60 tablet 8  . Cholecalciferol (VITAMIN D) 2000 UNITS CAPS Take by mouth.    . Eszopiclone 3 MG TABS TAKE 1 TABLET BY MOUTH AT BEDTIME AS NEEDED TAKE IMMEDIATELY BEFORE BEDTIME 30 tablet 1  . fenofibrate 160 MG tablet TAKE 1 TABLET (160 MG TOTAL) BY MOUTH DAILY. REPEAT LABS ARE DUE NOW 90 tablet 0  . Flaxseed, Linseed, (FLAX SEED OIL PO) Take by mouth daily.      [name] levocetirizine (XYZAL) 5 MG tablet Take 1 tablet (5 mg total) by mouth every evening. (Patient taking differently: Take 5 mg by mouth daily as needed. ) 30 tablet 5  . lisinopril (PRINIVIL,ZESTRIL) 20 MG tablet Take 1 tablet (20 mg total) by mouth daily. 90 tablet 3  . LORazepam (ATIVAN) 0.5 MG tablet 2 bid  1  qday  prn 150 tablet 5  . Multiple Vitamin (MULTIVITAMIN) tablet Take 1 tablet by mouth daily.    [name] NEXIUM 40 MG capsule TAKE 1 CAPSULE BY MOUTH ONCE DAILY AS NEEDED 90 capsule 0  . rosuvastatin (CRESTOR) 40 MG tablet TAKE 1 TABLET BY MOUTH DAILY 90 tablet 1  . tamsulosin (FLOMAX) 0.4 MG CAPS capsule 1 po qd 30 capsule    No current facility-administered medications for this visit.     Lab Results: No results found for this or any previous visit (from the past 48 hour(s)).  Physical Findings: AIMS:  , ,  ,  ,    CIWA:    COWS:     Musculoskeletal: Strength & Muscle Tone: within normal limits Gait & Station: normal Patient leans: Right  Psychiatric Specialty Exam: ROS  Blood pressure 128/76, pulse 67, height 5\' 11"  (1.803 m), weight 238 lb 9.6 oz (108.2 kg).Body mass index is 33.28 kg/m.  General  Appearance: Casual  Eye Contact::  Good  Speech:  Clear and Coherent  Volume:  Normal  Mood:  Dysphoric and Euthymic  Affect:  Appropriate  Thought Process:  Coherent  Orientation:  Full (Time, Place, and Person)  Thought Content:  WDL  Suicidal Thoughts:  No  Homicidal Thoughts:  No  Memory:  NA  Judgement:  Good  Insight:  Fair  Psychomotor Activity:  Normal  Concentration:  Good  Recall:  Good  Fund of Knowledge:Good  Language: Good  Akathisia:  No  Handed:  Right  AIMS (if indicated):     Assets:  Desire for Improvement  ADL's:  Intact  Cognition: WNL  Sleep:      Treatment Plan Summary: 07/30/2016, 11:43 AM  Today the patient is doing very well. Continue taking Tegretol as prescribed. His blood level is good as well as the rest of his labs. The patient continue taking Ativan 0.5 mg 2 in the morning 2 at night taking one extra if needed. The patient also takes Lunesta 3 mg at night which are very well. Patient rarely ever the patient will return to see me in 5 months. He is in good spirits. Today we spent more than 50% time talking about his medications and educating about their actions.

## 2016-08-03 ENCOUNTER — Encounter: Primary: Internal Medicine

## 2016-08-03 ENCOUNTER — Encounter: Attending: Hematology & Oncology | Primary: Internal Medicine

## 2016-08-09 MED FILL — TAMSULOSIN HCL 0.4 MG CAP: 0.4 | 90 days supply | Qty: 90 | Fill #0

## 2016-08-10 ENCOUNTER — Encounter: Attending: Hematology & Oncology | Primary: Internal Medicine

## 2016-08-13 ENCOUNTER — Inpatient Hospital Stay: Admit: 2016-08-13 | Discharge: 2016-08-14 | Primary: Internal Medicine

## 2016-08-13 DIAGNOSIS — N184 Chronic kidney disease, stage 4 (severe): Principal | ICD-10-CM

## 2016-08-13 DIAGNOSIS — D631 Anemia in chronic kidney disease: Secondary | ICD-10-CM

## 2016-08-13 DIAGNOSIS — D500 Iron deficiency anemia secondary to blood loss (chronic): Secondary | ICD-10-CM

## 2016-08-13 DIAGNOSIS — D696 Thrombocytopenia, unspecified: Secondary | ICD-10-CM

## 2016-08-13 MED ORDER — EPOETIN ALFA 20000 UNIT/ML IJ SOLN
20000 [IU] | Freq: Once | SUBCUTANEOUS | Status: CN
Start: 2016-08-13 — End: ?

## 2016-08-13 MED ORDER — EPOETIN ALFA 20000 UNIT/ML IJ SOLN
20000 [IU] | Freq: Once | SUBCUTANEOUS | Status: CP
Start: 2016-08-13 — End: ?

## 2016-08-18 DIAGNOSIS — N3941 Urge incontinence: Secondary | ICD-10-CM | POA: Diagnosis not present

## 2016-08-27 ENCOUNTER — Inpatient Hospital Stay: Admit: 2016-08-27 | Discharge: 2016-08-28 | Primary: Internal Medicine

## 2016-08-27 DIAGNOSIS — I6350 Cerebral infarction due to unspecified occlusion or stenosis of unspecified cerebral artery: Secondary | ICD-10-CM

## 2016-08-27 DIAGNOSIS — Z79899 Other long term (current) drug therapy: Secondary | ICD-10-CM

## 2016-08-27 DIAGNOSIS — E119 Type 2 diabetes mellitus without complications: Secondary | ICD-10-CM

## 2016-08-27 DIAGNOSIS — Z7902 Long term (current) use of antithrombotics/antiplatelets: Secondary | ICD-10-CM

## 2016-08-27 DIAGNOSIS — Z951 Presence of aortocoronary bypass graft: Secondary | ICD-10-CM

## 2016-08-27 DIAGNOSIS — I5030 Unspecified diastolic (congestive) heart failure: Secondary | ICD-10-CM

## 2016-08-27 DIAGNOSIS — I130 Hypertensive heart and chronic kidney disease with heart failure and stage 1 through stage 4 chronic kidney disease, or unspecified chronic kidney disease: Principal | ICD-10-CM

## 2016-08-27 DIAGNOSIS — F1010 Alcohol abuse, uncomplicated: Secondary | ICD-10-CM

## 2016-08-27 DIAGNOSIS — I639 Cerebral infarction, unspecified: Secondary | ICD-10-CM

## 2016-08-27 DIAGNOSIS — I4891 Unspecified atrial fibrillation: Secondary | ICD-10-CM

## 2016-08-27 DIAGNOSIS — E785 Hyperlipidemia, unspecified: Secondary | ICD-10-CM

## 2016-08-27 DIAGNOSIS — N184 Chronic kidney disease, stage 4 (severe): Secondary | ICD-10-CM

## 2016-08-27 DIAGNOSIS — E1122 Type 2 diabetes mellitus with diabetic chronic kidney disease: Secondary | ICD-10-CM

## 2016-08-27 DIAGNOSIS — I509 Heart failure, unspecified: Principal | ICD-10-CM

## 2016-08-27 DIAGNOSIS — I2510 Atherosclerotic heart disease of native coronary artery without angina pectoris: Secondary | ICD-10-CM

## 2016-08-27 DIAGNOSIS — D631 Anemia in chronic kidney disease: Secondary | ICD-10-CM

## 2016-08-27 DIAGNOSIS — I10 Essential (primary) hypertension: Secondary | ICD-10-CM

## 2016-08-27 MED ORDER — EPOETIN ALFA 20000 UNIT/ML IJ SOLN
20000 [IU] | Freq: Once | SUBCUTANEOUS | Status: CP
Start: 2016-08-27 — End: ?

## 2016-08-27 MED ORDER — EPOETIN ALFA 20000 UNIT/ML IJ SOLN
20000 [IU] | Freq: Once | SUBCUTANEOUS | Status: CN
Start: 2016-08-27 — End: ?

## 2016-08-27 NOTE — Progress Notes
Hgb 9.7.  Procrit given Sub Q in the left upper arm per patient request.  Patient tolerated injection well and voiced no complaints.  Patient went to checkout to schedule future appointments.  Patient was ambulatory when he left the treatment area

## 2016-08-27 NOTE — Progress Notes
Hgb 9.7.  Procrit given Sub Q in the left upper arm per patient request.  Patient tolerated treatment well and voiced no complaints.  Patient went to checkout to schedule future appointments.  Patient was ambulatory when he left the treatment area

## 2016-08-30 MED FILL — CARBAMAZEPINE ER 100 MG TAB: 100 | 30 days supply | Qty: 60 | Fill #6

## 2016-08-31 MED FILL — ESZOPICLONE 3 MG TABLET: 3 | 30 days supply | Qty: 30 | Fill #0

## 2016-08-31 MED FILL — LORazepam 0.5 MG TABS: 0.5 | 30 days supply | Qty: 150 | Fill #0

## 2016-09-10 ENCOUNTER — Inpatient Hospital Stay: Admit: 2016-09-10 | Discharge: 2016-09-11 | Primary: Internal Medicine

## 2016-09-10 DIAGNOSIS — D696 Thrombocytopenia, unspecified: Secondary | ICD-10-CM

## 2016-09-10 DIAGNOSIS — D631 Anemia in chronic kidney disease: Secondary | ICD-10-CM

## 2016-09-10 DIAGNOSIS — N184 Chronic kidney disease, stage 4 (severe): Principal | ICD-10-CM

## 2016-09-13 ENCOUNTER — Other Ambulatory Visit: Payer: Self-pay | Admitting: Family Medicine

## 2016-09-13 MED FILL — FENOFIBRATE 160 MG TABLET: 160 | 90 days supply | Qty: 90 | Fill #0

## 2016-09-16 ENCOUNTER — Encounter: Payer: Self-pay | Admitting: Family Medicine

## 2016-09-17 NOTE — Telephone Encounter (Signed)
Looks like its overdue if last one was 2/ 2017----  If plain medicare he gets medicare wellness with rn if other medicare he can have preventative and medicare wellness

## 2016-09-24 ENCOUNTER — Inpatient Hospital Stay: Admit: 2016-09-24 | Discharge: 2016-09-25 | Primary: Internal Medicine

## 2016-09-24 DIAGNOSIS — D631 Anemia in chronic kidney disease: Secondary | ICD-10-CM

## 2016-09-24 DIAGNOSIS — N186 End stage renal disease: Secondary | ICD-10-CM

## 2016-09-24 DIAGNOSIS — N184 Chronic kidney disease, stage 4 (severe): Principal | ICD-10-CM

## 2016-09-24 MED ORDER — EPOETIN ALFA 20000 UNIT/ML IJ SOLN
20000 [IU] | Freq: Once | SUBCUTANEOUS | Status: CP
Start: 2016-09-24 — End: ?

## 2016-09-24 MED ORDER — EPOETIN ALFA 20000 UNIT/ML IJ SOLN
20000 [IU] | Freq: Once | SUBCUTANEOUS | Status: CN
Start: 2016-09-24 — End: ?

## 2016-09-27 MED FILL — CARBAMAZEPINE ER 100 MG TAB: 100 | 30 days supply | Qty: 60 | Fill #7

## 2016-10-01 MED FILL — ESZOPICLONE 3 MG TABLET: 3 | 30 days supply | Qty: 30 | Fill #1

## 2016-10-07 DIAGNOSIS — N3941 Urge incontinence: Secondary | ICD-10-CM | POA: Diagnosis not present

## 2016-10-07 DIAGNOSIS — N5201 Erectile dysfunction due to arterial insufficiency: Secondary | ICD-10-CM | POA: Diagnosis not present

## 2016-10-08 ENCOUNTER — Inpatient Hospital Stay: Admit: 2016-10-08 | Discharge: 2016-10-09 | Primary: Internal Medicine

## 2016-10-08 DIAGNOSIS — D631 Anemia in chronic kidney disease: Secondary | ICD-10-CM

## 2016-10-08 DIAGNOSIS — N184 Chronic kidney disease, stage 4 (severe): Secondary | ICD-10-CM

## 2016-10-08 MED ORDER — EPOETIN ALFA 20000 UNIT/ML IJ SOLN
20000 [IU] | Freq: Once | SUBCUTANEOUS | Status: CN
Start: 2016-10-08 — End: ?

## 2016-10-08 MED ORDER — EPOETIN ALFA 20000 UNIT/ML IJ SOLN
20000 [IU] | Freq: Once | SUBCUTANEOUS | Status: CP
Start: 2016-10-08 — End: ?

## 2016-10-11 MED FILL — LORazepam 0.5 MG TABS: 0.5 | 30 days supply | Qty: 150 | Fill #1

## 2016-10-18 MED FILL — ROSUVASTATIN CALCIUM 40 MG: 40 | 90 days supply | Qty: 90 | Fill #1

## 2016-10-18 MED FILL — LISINOPRIL 20 MG TABLET: 20 | 90 days supply | Qty: 90 | Fill #3

## 2016-10-21 DIAGNOSIS — D696 Thrombocytopenia, unspecified: Principal | ICD-10-CM

## 2016-10-21 DIAGNOSIS — N184 Chronic kidney disease, stage 4 (severe): Secondary | ICD-10-CM

## 2016-10-22 ENCOUNTER — Ambulatory Visit: Admit: 2016-10-22 | Discharge: 2016-10-23 | Attending: Hematology & Oncology | Primary: Internal Medicine

## 2016-10-22 ENCOUNTER — Inpatient Hospital Stay: Admit: 2016-10-22 | Discharge: 2016-10-23 | Primary: Internal Medicine

## 2016-10-22 DIAGNOSIS — I6350 Cerebral infarction due to unspecified occlusion or stenosis of unspecified cerebral artery: Secondary | ICD-10-CM

## 2016-10-22 DIAGNOSIS — Z79899 Other long term (current) drug therapy: Secondary | ICD-10-CM

## 2016-10-22 DIAGNOSIS — Z79891 Long term (current) use of opiate analgesic: Secondary | ICD-10-CM

## 2016-10-22 DIAGNOSIS — K635 Polyp of colon: Secondary | ICD-10-CM

## 2016-10-22 DIAGNOSIS — E119 Type 2 diabetes mellitus without complications: Secondary | ICD-10-CM

## 2016-10-22 DIAGNOSIS — I4891 Unspecified atrial fibrillation: Secondary | ICD-10-CM

## 2016-10-22 DIAGNOSIS — Z955 Presence of coronary angioplasty implant and graft: Secondary | ICD-10-CM

## 2016-10-22 DIAGNOSIS — Z7289 Other problems related to lifestyle: Secondary | ICD-10-CM

## 2016-10-22 DIAGNOSIS — E1122 Type 2 diabetes mellitus with diabetic chronic kidney disease: Principal | ICD-10-CM

## 2016-10-22 DIAGNOSIS — I2510 Atherosclerotic heart disease of native coronary artery without angina pectoris: Secondary | ICD-10-CM

## 2016-10-22 DIAGNOSIS — I10 Essential (primary) hypertension: Secondary | ICD-10-CM

## 2016-10-22 DIAGNOSIS — D631 Anemia in chronic kidney disease: Secondary | ICD-10-CM

## 2016-10-22 DIAGNOSIS — D696 Thrombocytopenia, unspecified: Secondary | ICD-10-CM

## 2016-10-22 DIAGNOSIS — K5730 Diverticulosis of large intestine without perforation or abscess without bleeding: Secondary | ICD-10-CM

## 2016-10-22 DIAGNOSIS — I130 Hypertensive heart and chronic kidney disease with heart failure and stage 1 through stage 4 chronic kidney disease, or unspecified chronic kidney disease: Secondary | ICD-10-CM

## 2016-10-22 DIAGNOSIS — Z7982 Long term (current) use of aspirin: Secondary | ICD-10-CM

## 2016-10-22 DIAGNOSIS — I509 Heart failure, unspecified: Secondary | ICD-10-CM

## 2016-10-22 DIAGNOSIS — N184 Chronic kidney disease, stage 4 (severe): Secondary | ICD-10-CM

## 2016-10-22 DIAGNOSIS — I639 Cerebral infarction, unspecified: Secondary | ICD-10-CM

## 2016-10-22 DIAGNOSIS — E785 Hyperlipidemia, unspecified: Secondary | ICD-10-CM

## 2016-10-22 DIAGNOSIS — F1010 Alcohol abuse, uncomplicated: Secondary | ICD-10-CM

## 2016-10-22 MED ORDER — VOLTAREN 1 % TD GEL: Start: 2016-10-22 — End: 2017-01-14

## 2016-10-22 MED ORDER — ASPIRIN 325 MG PO TABS
325 mg | Freq: Every day | ORAL
Start: 2016-10-22 — End: 2017-01-14

## 2016-10-22 MED ORDER — EPOETIN ALFA 20000 UNIT/ML IJ SOLN
20000 [IU] | Freq: Once | SUBCUTANEOUS | Status: CN
Start: 2016-10-22 — End: ?

## 2016-10-22 MED ORDER — EPOETIN ALFA 20000 UNIT/ML IJ SOLN
20000 [IU] | Freq: Once | SUBCUTANEOUS | Status: CP
Start: 2016-10-22 — End: ?

## 2016-10-22 NOTE — Patient Instructions
Procrit every 2 weeks. CBC and Procrit 3 months.

## 2016-10-22 NOTE — Progress Notes
?   Neutrophils % 08/27/2016 68.1    ? Lymphocytes Absolute 08/27/2016 0.53    ? Lymphocytes % 08/27/2016 11.5*   ? Monocytes Absolute 08/27/2016 0.53    ? Monocytes % 08/27/2016 11.5    ? Eosinophils Absolute 08/27/2016 0.40    ? Eosinophils % 08/27/2016 8.7    ? Basophil Absolute 08/27/2016 0.01    ? BASOPHILS 08/27/2016 0.2          Imaging Results:     Health Maintenance: Colonoscopy:2015    Physical Exam:   General: Well developed, well nourished, No acute Distress, Gentleman patient and Elderly appearance  Head:Atraumatic and normocephalic  Eyes:PERRLA, EOMS intact, No jaundice, pallor and Conjunctiva clear  Ears, Nose, Throat, Neck, and Mouth: Trachea Midline, No JVD, No Lymphadenopathy, Thyroid midline-normal, Neck supple and No oral lesions  Cardiovascular:S1,S2 no murmurs and Regular heart beat  Chest:Symmetrical, No kyphosis and No scoliosis  Respiratory:Clear, No rales/Rhonchi  Gastrointestinal:Abdomen soft, Abdomen non-tender, Abdomen non-distended, Abdomen without masses, No ascites and No hepatosplenomegaly  Hematologic/Lymphatic:No petechiae, No purpura, No neck lymphadenopathy, No Axillary lymphadenopathy and No Groin lymphadenopathy  Musculoskeletal: ambulates without assistance, normal tone  Extremities: Trace?edema BLE, No cyanosis, No digital clubbing and No discoloration  Neurologic:Alert and oriented, No Hemiparesis and No motor deficits    Assessment:  Jeffery Gonzales is a 35?year old male who was referred to the hematology office for an evaluation of anemia. 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 The patient completed a GI evaluation which did not reveal a specific source of bleeding. However, I suspect that the patient likely has had chronic GI blood loss resulting in the anemia. The patient has a history of prior GI blood loss which was exacerbated by Xarelto. The patient was on anticoagulation due to the

## 2016-10-22 NOTE — Progress Notes
No referring provider defined for this encounter.    Chief Complaint   Patient presents with   ? Follow-up     Anemia in CKD.       Patient Name: Jeffery Gonzales    Date: 10/22/2016    Date Of Birth: 03-06-1941    History of Present Illness:  Jeffery Gonzales is a 89?year old Caucasian male who was referred to the hematology office for an evaluation of anemia. The patient has undergone an evaluation by Dr. Carolina Cellar at [facility] due to iron deficiency anemia. The patient has a history of a small bowel GI bleed in 2013 and a second episode in February 2015 most recently occurring in the setting of Xarelto use. The patient has required several transfusions in the past. The patient denies any recent evidence of bleeding specifically no bright red blood per rectum or black stools. He underwent a colonoscopy exam 10/17/2013 which revealed a 3 mm sessile polyp in the descending colon which was removed, a 3 mm sessile polyp in the descending colon which was removed, a diminutive sessile polyp in the sigmoid colon which was removed, severe diverticulosis and otherwise a normal exam. An upper endoscopy obtained 10/17/2013 revealed patchy erythema in the antrum of the stomach but otherwise a normal exam. A capsule endoscopy obtained 10/18/2013 revealed a small erythematous spot in the proximal-mi jejunum without stigmata for bleeding. The remainder of the small bowel exam was normal. As there were no overt bleeding sites identified on the GI evaluation the patient was referred to the hematology office for additional assessment of the anemia and to evaluate for a possible myelodysplasia.  ??  The patient has a significant history of coronary artery disease, diabetes, congestive heart failure, hypertension, hyperlipidemia. He states that he fell and required hip surgery in March 2015.  ??  The patient received a dose of Injectafer 06/18/2014 but did not return the following week to receive cycle 2. The patient states that he had been

## 2016-10-22 NOTE — Progress Notes
Medications:   Current Outpatient Prescriptions:   ?  aspirin 325 MG PO Tablet, Take 325 mg by mouth daily., Disp: , Rfl:   ?  atorvastatin (LIPITOR) 40 MG tablet, Take 1 Tablet by mouth nightly., Disp: 30 Tablet, Rfl: 12  ?  furosemide (LASIX) 40 MG tablet, Take 1 Tablet by mouth 2 times daily., Disp: 30 Tablet, Rfl: 12  ?  oxyCODONE-acetaminophen (PERCOCET) 10-325 MG Tablet, Take 10-325 tablets by mouth daily., Disp: , Rfl:   ?  STOOL SOFTENER LAXATIVE DC 240 MG Capsule, Take 240 mg by mouth daily., Disp: , Rfl:   ?  tamsulosin (FLOMAX) 0.4 MG PO Capsule, , Disp: , Rfl:   ?  VOLTAREN 1 % TD Gel, , Disp: , Rfl:       Labs:   Hospital Outpatient Visit on 10/22/2016   Component Date Value   ? WBC 10/22/2016 4.72    ? RBC 10/22/2016 3.41    ? Hemoglobin 10/22/2016 9.1*   ? Hematocrit 10/22/2016 29.6*   ? MCV 10/22/2016 86.8    ? [facility] 10/22/2016 26.7    ? MCHC 10/22/2016 30.7    ? RDW 10/22/2016 15.3    ? Platelet Count 10/22/2016 118*   ? MPV 10/22/2016 9.1    ? Neutrophils Absolute 10/22/2016 3.31    ? Neutrophils % 10/22/2016 70.1    ? Lymphocytes Absolute 10/22/2016 0.56    ? Lymphocytes % 10/22/2016 11.9*   ? Monocytes Absolute 10/22/2016 0.61    ? Monocytes % 10/22/2016 12.9    ? Eosinophils Absolute 10/22/2016 0.22    ? Eosinophils % 10/22/2016 4.7    ? Basophil Absolute 10/22/2016 0.02    ? BASOPHILS 10/22/2016 0.4    Hospital Outpatient Visit on 10/08/2016   Component Date Value   ? WBC 10/08/2016 4.66    ? RBC 10/08/2016 3.41    ? Hemoglobin 10/08/2016 9.3*   ? Hematocrit 10/08/2016 30.1*   ? MCV 10/08/2016 88.3    ? [facility] 10/08/2016 27.3    ? MCHC 10/08/2016 30.9    ? RDW 10/08/2016 14.7    ? Platelet Count 10/08/2016 113*   ? MPV 10/08/2016 9.3    ? Neutrophils Absolute 10/08/2016 3.04    ? Neutrophils % 10/08/2016 65.2    ? Lymphocytes Absolute 10/08/2016 0.61    ? Lymphocytes % 10/08/2016 13.1*   ? Monocytes Absolute 10/08/2016 0.59    ? Monocytes % 10/08/2016 12.7

## 2016-10-22 NOTE — Progress Notes
?   Eosinophils Absolute 10/08/2016 0.41    ? Eosinophils % 10/08/2016 8.8    ? Basophil Absolute 10/08/2016 0.01    ? BASOPHILS 10/08/2016 0.2    Hospital Outpatient Visit on 09/24/2016   Component Date Value   ? WBC 09/24/2016 2.53*   ? RBC 09/24/2016 3.53    ? Hemoglobin 09/24/2016 9.9*   ? Hematocrit 09/24/2016 32.4*   ? MCV 09/24/2016 91.8    ? [facility] 09/24/2016 28.0    ? MCHC 09/24/2016 30.6    ? RDW 09/24/2016 15.1    ? Platelet Count 09/24/2016 88*   ? MPV 09/24/2016 9.8    ? Neutrophils Absolute 09/24/2016 1.58    ? Neutrophils % 09/24/2016 62.4    ? Lymphocytes Absolute 09/24/2016 0.29    ? Lymphocytes % 09/24/2016 11.5*   ? Monocytes Absolute 09/24/2016 0.50    ? Monocytes % 09/24/2016 19.8    ? Eosinophils Absolute 09/24/2016 0.15    ? Eosinophils % 09/24/2016 5.9    ? Basophil Absolute 09/24/2016 0.01    ? BASOPHILS 09/24/2016 0.4    Hospital Outpatient Visit on 09/10/2016   Component Date Value   ? WBC 09/10/2016 4.06*   ? RBC 09/10/2016 3.75    ? Hemoglobin 09/10/2016 10.6*   ? Hematocrit 09/10/2016 34.4*   ? MCV 09/10/2016 91.7    ? MCH 09/10/2016 28.3    ? MCHC 09/10/2016 30.8    ? RDW 09/10/2016 14.6    ? Platelet Count 09/10/2016 103*   ? MPV 09/10/2016 8.9    ? Neutrophils Absolute 09/10/2016 2.88    ? Neutrophils % 09/10/2016 71.0    ? Lymphocytes Absolute 09/10/2016 0.44    ? Lymphocytes % 09/10/2016 10.8*   ? Monocytes Absolute 09/10/2016 0.37    ? Monocytes % 09/10/2016 9.1    ? Eosinophils Absolute 09/10/2016 0.36    ? Eosinophils % 09/10/2016 8.9    ? Basophil Absolute 09/10/2016 0.01    ? BASOPHILS 09/10/2016 0.2    Hospital Outpatient Visit on 08/27/2016   Component Date Value   ? WBC 08/27/2016 4.59    ? RBC 08/27/2016 3.35    ? Hemoglobin 08/27/2016 9.7*   ? Hematocrit 08/27/2016 32.0*   ? MCV 08/27/2016 95.5    ? [facility] 08/27/2016 29.0    ? MCHC 08/27/2016 30.3    ? RDW 08/27/2016 14.7    ? Platelet Count 08/27/2016 92*   ? MPV 08/27/2016 8.6    ? Neutrophils Absolute 08/27/2016 3.12

## 2016-10-22 NOTE — Progress Notes
?   Alcohol use 216.0 oz/week     18 Cans of beer per week   ? Drug use: No   ? Sexual activity: No     Other Topics Concern   ? Not on file     Social History Narrative   ? No narrative on file       Family History:    Family History   Problem Relation Age of Onset   ? Diabetes Mother    ? Cancer Father        Vital Signs:   Vitals:    10/22/16 1042   BP: 132/68   Pulse: 76   Resp: 18   Temp: 36.9 ?C (98.4 ?F)   Weight: 101.4 kg (223 lb 8 oz)   Height: 1.778 m (5\' 10" )       Allergies: No known drug allergy    Review of Systems:  Constitutional:?No weight loss  Fatigue-improved  No loss of appetite  No night sweats  No fever  No chills?  Weight loss due to diuresis  Eyes:?No blurred vision  No double vision  No difficulty seeing  Ears,Nose, Mouth,Throat: No hearing loss  No ringing in ears  No sinus trouble  No difficulty swallowing  No sore throat  No nasal drainage  No nose bleeds  No hoarseness  Cardiac:No heart papitations  No lightheadedness  Swelling in legs: improved  Respiratory:No cough  No sputum production  No hemoptysis  No shortness of breath  No orthopnea  Gastrointestinal:No nausea  No vomiting  No heartburn  No constipation  No diarrhea  No abdominal pain  No rectal bleeding  No Bowel incontinence  Genitourinary:No burning on urination  No pain with urination  No blood in urine  No frequent urination  No urinary incontinence  Musculosketal:No muscle pain  No stiffness  No joint pain  No joint swelling  No back pain  Skin:No skin rash  Neurological:No seizures  No dizziness  No loss of balance  No weakness of limbs  No loss of sensation  Tingling sensations bilateral hands  No memory loss  No difficulty talking  Psychiatric:No nervousness  No depression  No restlessness  No difficulty sleeping  Hematologic/Lymphatic/Immunologic:No bleeding  No lumps in arm pits  No lumps in neck  No lumps in   ?  The past/family/social history and review of systems remains accurate as of today's date 10/22/2016.

## 2016-10-22 NOTE — Addendum Note
Addended by: Daine Floras on: 10/22/2016 11:02 AM     Modules accepted: Orders

## 2016-10-22 NOTE — Progress Notes
suffering from persistent pain in the hip and is being evaluated by orthopedics and pain management. The patient received dose 2 of Injectafer in February 2016, July 2016, Septmeber 2016 and February 2017.   ??  The patient underwent a bone marrow biopsy and aspirate 04/28/15 to evaluate for possible MDS. This revealed a hyperplastic marrow (65% cellularity) with mild myeloid hyperplasia and erythroid hyperplasia. Flow cytometry showed no evidence of acute leukemia, B-cell lymphproliferative process or T-cell aberrancy. Cytogenetics: 65 XY karyotype. FISH analysis: Normal results seen for 3 every week, 5, 7, 8, 11(MLL), 12(ETV6), 17, 19, 20. This was interpreted as a normal result. In the absence of a cytogenetic/fish abnormality, the changes are likely reactive. Clinical correlation and followup are recommended.  ??  The patient received a transfusion in November 2016 and has received several doses of intravenous iron. The patient states that he has elected not to return to the GI physicians as he is unwilling to undergo any additional GI evaluations.  ?  The patient returns for the scheduled appointment and next dose of Procrit therapy. The patient denies any new medical issues and voices no new complaints. The patient denies symptoms of shortness of breath, chest pain, bleeding including bright red blood per rectum or black stools. The patient states that the swelling in his legs has reduced to the point where he no longer requires the use of support stockings.  Past Surgical History:   Past Surgical History:   Procedure Laterality Date   ? APPENDECTOMY     ? CORONARY ANGIOPLASTY WITH STENT PLACEMENT      x 2       Social History:    Social History     Social History   ? Marital status: Divorced     Spouse name: N/A   ? Number of children: N/A   ? Years of education: N/A     Occupational History   ? Not on file.     Social History Main Topics   ? Smoking status: Never Smoker   ? Smokeless tobacco: Never Used

## 2016-10-22 NOTE — Progress Notes
congestive heart failure and atrial fibrillation however this has since been discontinued. The patient remains?on aspirin therapy alone.  ??  The patient denies any overt bleeding issues. There were no findings of B12 deficiency, folate deficiency, or hemolysis. The bone marrow biopsy did not show evidence of leukemia, lymphoma, myelodysplasia, or myelofibrosis. The changes noted in the bone marrow were felt consistent with a reactive process. 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 ?  I have reviewed the results of laboratory studies with the patient today. The patient will continue Procrit 20,000 subcu every 2 weeks. The BMP, ferritin and iron profile results are pending.  ?    Recommendation/Plan:   1. The patient will receive Procrit 20,000 units subcu today and every 2 weeks.  2.?The patient will return for an exam, CBC and Procrit injection in 3 months.  3. As always, Jeffery Gonzales understands that he may return to the office sooner than the scheduled appointment if any questions or concerns arise during the interim.    Return to Clinic: Return in about 3 months (around 01/22/2017).      This document was created using voice recognition software. Inadvertent typographical errors, word omissions and/or word substitutions may exist.

## 2016-10-26 MED ORDER — DIPHENHYDRAMINE HCL 50 MG/ML IJ SOLN
25 mg | INTRAVENOUS | Status: CN | PRN
Start: 2016-10-26 — End: ?

## 2016-10-26 MED ORDER — FERRIC CARBOXYMALTOSE IVPB
750 mg | Freq: Once | INTRAVENOUS | Status: CN
Start: 2016-10-26 — End: ?

## 2016-10-26 MED ORDER — HEPARIN SODIUM LOCK FLUSH 100 UNIT/ML IV SOLN
500 [IU] | Status: CN | PRN
Start: 2016-10-26 — End: ?

## 2016-10-26 MED ORDER — EPINEPHRINE 0.3 MG/0.3ML IJ SOAJ
0.3 mg | INTRAMUSCULAR | Status: CN | PRN
Start: 2016-10-26 — End: ?

## 2016-10-26 MED ORDER — HYDROCORTISONE NA SUCCINATE PF 100 MG IJ SOLR
100 mg | INTRAVENOUS | Status: CN | PRN
Start: 2016-10-26 — End: ?

## 2016-10-26 MED ORDER — DEXAMETHASONE SODIUM PHOSPHATE 20 MG/5ML IJ SOLN
20 mg | INTRAVENOUS | Status: CN | PRN
Start: 2016-10-26 — End: ?

## 2016-10-26 MED ORDER — MEPERIDINE HCL 25 MG/ML IJ SOLN
25 mg | INTRAVENOUS | Status: CN | PRN
Start: 2016-10-26 — End: ?

## 2016-10-26 MED ORDER — SODIUM CHLORIDE FLUSH 0.9 % IV SOLN
10 mL | Status: CN | PRN
Start: 2016-10-26 — End: ?

## 2016-10-26 MED ORDER — SODIUM CHLORIDE 0.9 % IV SOLN
Freq: Once | INTRAVENOUS | Status: CN
Start: 2016-10-26 — End: ?

## 2016-10-26 NOTE — Addendum Note
Addended by: Jeanella Flattery on: 10/26/2016 10:24 AM     Modules accepted: Orders

## 2016-10-28 ENCOUNTER — Inpatient Hospital Stay: Admit: 2016-10-28 | Discharge: 2016-10-29 | Primary: Internal Medicine

## 2016-10-28 DIAGNOSIS — D500 Iron deficiency anemia secondary to blood loss (chronic): Principal | ICD-10-CM

## 2016-10-28 MED ORDER — SODIUM CHLORIDE FLUSH 0.9 % IV SOLN
10 mL | Status: DC | PRN
Start: 2016-10-28 — End: 2016-10-29

## 2016-10-28 MED ORDER — EPINEPHRINE 0.3 MG/0.3ML IJ SOAJ
0.3 mg | INTRAMUSCULAR | Status: CN | PRN
Start: 2016-10-28 — End: ?

## 2016-10-28 MED ORDER — HEPARIN SODIUM LOCK FLUSH 100 UNIT/ML IV SOLN
500 [IU] | Status: CN | PRN
Start: 2016-10-28 — End: ?

## 2016-10-28 MED ORDER — FERRIC CARBOXYMALTOSE IVPB
750 mg | Freq: Once | INTRAVENOUS | Status: CN
Start: 2016-10-28 — End: ?

## 2016-10-28 MED ORDER — FERRIC CARBOXYMALTOSE IVPB
750 mg | Freq: Once | INTRAVENOUS | Status: DC
Start: 2016-10-28 — End: 2016-10-29

## 2016-10-28 MED ORDER — SODIUM CHLORIDE FLUSH 0.9 % IV SOLN
10 mL | Status: CN | PRN
Start: 2016-10-28 — End: ?

## 2016-10-28 MED ORDER — MEPERIDINE HCL 25 MG/ML IJ SOLN
25 mg | INTRAVENOUS | Status: CN | PRN
Start: 2016-10-28 — End: ?

## 2016-10-28 MED ORDER — DIPHENHYDRAMINE HCL 50 MG/ML IJ SOLN
25 mg | INTRAVENOUS | Status: CN | PRN
Start: 2016-10-28 — End: ?

## 2016-10-28 MED ORDER — HYDROCORTISONE NA SUCCINATE PF 100 MG IJ SOLR
100 mg | INTRAVENOUS | Status: CN | PRN
Start: 2016-10-28 — End: ?

## 2016-10-28 MED ORDER — SODIUM CHLORIDE 0.9 % IV SOLN
Freq: Once | INTRAVENOUS | Status: CN
Start: 2016-10-28 — End: ?

## 2016-10-28 MED ORDER — DEXAMETHASONE SODIUM PHOSPHATE 20 MG/5ML IJ SOLN
20 mg | INTRAVENOUS | Status: CN | PRN
Start: 2016-10-28 — End: ?

## 2016-10-28 NOTE — Progress Notes
Presented for Iron infusion. IV catheter #[id number] inserted in right hand and infusion given. Well tolerated. Sent to check out to schedule future appointment. Discharge home in stable condition

## 2016-10-29 ENCOUNTER — Other Ambulatory Visit (HOSPITAL_COMMUNITY): Payer: Self-pay

## 2016-10-29 ENCOUNTER — Encounter: Attending: Hematology & Oncology | Primary: Internal Medicine

## 2016-10-29 DIAGNOSIS — F0630 Mood disorder due to known physiological condition, unspecified: Secondary | ICD-10-CM

## 2016-10-29 MED ORDER — ESZOPICLONE 3 MG PO TABS: ORAL_TABLET | ORAL | 1 refills | Status: DC

## 2016-10-29 MED FILL — ESZOPICLONE 3 MG TABLET: 3 | 30 days supply | Qty: 30 | Fill #0

## 2016-10-29 NOTE — Telephone Encounter (Signed)
Per protocol I sent in a two month supply to last until the appointment

## 2016-10-29 NOTE — Telephone Encounter (Signed)
Medication refill request - Fax received from [facility] for a refill of Lunesta, last provided 07/30/16 + 1 refill and pt. does not return until 12/31/16. Last order filled 10/01/16 and needs new orders.

## 2016-11-01 MED FILL — TAMSULOSIN HCL 0.4 MG CAP: 0.4 | 90 days supply | Qty: 90 | Fill #1

## 2016-11-01 MED FILL — CARBAMAZEPINE ER 100 MG TAB: 100 | 30 days supply | Qty: 60 | Fill #8

## 2016-11-04 ENCOUNTER — Inpatient Hospital Stay: Admit: 2016-11-04 | Discharge: 2016-11-05 | Primary: Internal Medicine

## 2016-11-04 DIAGNOSIS — D500 Iron deficiency anemia secondary to blood loss (chronic): Principal | ICD-10-CM

## 2016-11-04 MED ORDER — FERRIC CARBOXYMALTOSE IVPB
750 mg | Freq: Once | INTRAVENOUS | Status: CN
Start: 2016-11-04 — End: ?

## 2016-11-04 MED ORDER — HYDROCORTISONE NA SUCCINATE PF 100 MG IJ SOLR
100 mg | INTRAVENOUS | Status: CN | PRN
Start: 2016-11-04 — End: ?

## 2016-11-04 MED ORDER — SODIUM CHLORIDE FLUSH 0.9 % IV SOLN
10 mL | Status: CN | PRN
Start: 2016-11-04 — End: ?

## 2016-11-04 MED ORDER — SODIUM CHLORIDE 0.9 % IV SOLN
Freq: Once | INTRAVENOUS | Status: CN
Start: 2016-11-04 — End: ?

## 2016-11-04 MED ORDER — MEPERIDINE HCL 25 MG/ML IJ SOLN
25 mg | INTRAVENOUS | Status: CN | PRN
Start: 2016-11-04 — End: ?

## 2016-11-04 MED ORDER — HEPARIN SODIUM LOCK FLUSH 100 UNIT/ML IV SOLN
500 [IU] | Status: CN | PRN
Start: 2016-11-04 — End: ?

## 2016-11-04 MED ORDER — EPINEPHRINE 0.3 MG/0.3ML IJ SOAJ
0.3 mg | INTRAMUSCULAR | Status: CN | PRN
Start: 2016-11-04 — End: ?

## 2016-11-04 MED ORDER — DIPHENHYDRAMINE HCL 50 MG/ML IJ SOLN
25 mg | INTRAVENOUS | Status: CN | PRN
Start: 2016-11-04 — End: ?

## 2016-11-04 MED ORDER — FERRIC CARBOXYMALTOSE IVPB
750 mg | Freq: Once | INTRAVENOUS | Status: DC
Start: 2016-11-04 — End: 2016-11-05

## 2016-11-04 MED ORDER — DEXAMETHASONE SODIUM PHOSPHATE 20 MG/5ML IJ SOLN
20 mg | INTRAVENOUS | Status: CN | PRN
Start: 2016-11-04 — End: ?

## 2016-11-04 MED ORDER — SODIUM CHLORIDE FLUSH 0.9 % IV SOLN
10 mL | Status: DC | PRN
Start: 2016-11-04 — End: 2016-11-05

## 2016-11-04 NOTE — Progress Notes
Pt. Tolerated treatment well. No C/O verbalized. Post infusion BP 132/69 and Pulse 71. Pt. D/C home walking in stable condition.

## 2016-11-05 ENCOUNTER — Ambulatory Visit: Primary: Internal Medicine

## 2016-11-15 MED FILL — LORazepam 0.5 MG TABS: 0.5 | 30 days supply | Qty: 150 | Fill #2

## 2016-11-18 MED ORDER — EPOETIN ALFA 20000 UNIT/ML IJ SOLN
20000 [IU] | Freq: Once | SUBCUTANEOUS | Status: CN
Start: 2016-11-18 — End: ?

## 2016-11-19 ENCOUNTER — Inpatient Hospital Stay: Admit: 2016-11-19 | Discharge: 2016-11-20 | Primary: Internal Medicine

## 2016-11-19 DIAGNOSIS — N184 Chronic kidney disease, stage 4 (severe): Secondary | ICD-10-CM

## 2016-11-19 DIAGNOSIS — D631 Anemia in chronic kidney disease: Secondary | ICD-10-CM

## 2016-11-19 DIAGNOSIS — D696 Thrombocytopenia, unspecified: Secondary | ICD-10-CM

## 2016-11-22 ENCOUNTER — Other Ambulatory Visit (HOSPITAL_COMMUNITY): Payer: Self-pay | Admitting: Psychiatry

## 2016-11-22 DIAGNOSIS — F0630 Mood disorder due to known physiological condition, unspecified: Secondary | ICD-10-CM

## 2016-11-22 MED FILL — CARBAMAZEPINE ER 100 MG TAB: 100 | 30 days supply | Qty: 60 | Fill #0

## 2016-11-22 MED FILL — ESZOPICLONE 3 MG TABLET: 3 | 30 days supply | Qty: 30 | Fill #1

## 2016-12-03 ENCOUNTER — Inpatient Hospital Stay: Admit: 2016-12-03 | Discharge: 2016-12-04 | Primary: Internal Medicine

## 2016-12-03 DIAGNOSIS — D631 Anemia in chronic kidney disease: Secondary | ICD-10-CM

## 2016-12-03 DIAGNOSIS — N184 Chronic kidney disease, stage 4 (severe): Principal | ICD-10-CM

## 2016-12-03 MED ORDER — EPOETIN ALFA 20000 UNIT/ML IJ SOLN
20000 [IU] | Freq: Once | SUBCUTANEOUS | Status: CP
Start: 2016-12-03 — End: ?

## 2016-12-03 MED ORDER — EPOETIN ALFA 20000 UNIT/ML IJ SOLN
20000 [IU] | Freq: Once | SUBCUTANEOUS | Status: CN
Start: 2016-12-03 — End: ?

## 2016-12-14 ENCOUNTER — Other Ambulatory Visit: Payer: Self-pay | Admitting: Family Medicine

## 2016-12-14 DIAGNOSIS — L821 Other seborrheic keratosis: Secondary | ICD-10-CM | POA: Diagnosis not present

## 2016-12-14 DIAGNOSIS — L218 Other seborrheic dermatitis: Secondary | ICD-10-CM | POA: Diagnosis not present

## 2016-12-14 DIAGNOSIS — L570 Actinic keratosis: Secondary | ICD-10-CM | POA: Diagnosis not present

## 2016-12-14 DIAGNOSIS — X32XXXD Exposure to sunlight, subsequent encounter: Secondary | ICD-10-CM | POA: Diagnosis not present

## 2016-12-14 MED FILL — FENOFIBRATE 160 MG TABLET: 160 | 90 days supply | Qty: 90 | Fill #0

## 2016-12-16 MED FILL — FLUTICASONE PROP 0.05% CRM: 0.05 | 30 days supply | Qty: 60 | Fill #0

## 2016-12-17 ENCOUNTER — Inpatient Hospital Stay: Primary: Internal Medicine

## 2016-12-20 ENCOUNTER — Inpatient Hospital Stay: Admit: 2016-12-20 | Discharge: 2016-12-21 | Primary: Internal Medicine

## 2016-12-20 DIAGNOSIS — D631 Anemia in chronic kidney disease: Secondary | ICD-10-CM

## 2016-12-20 DIAGNOSIS — N184 Chronic kidney disease, stage 4 (severe): Secondary | ICD-10-CM

## 2016-12-20 MED ORDER — EPOETIN ALFA 20000 UNIT/ML IJ SOLN
20000 [IU] | Freq: Once | SUBCUTANEOUS | Status: CN
Start: 2016-12-20 — End: ?

## 2016-12-20 MED ORDER — EPOETIN ALFA 20000 UNIT/ML IJ SOLN
20000 [IU] | Freq: Once | SUBCUTANEOUS | Status: CP
Start: 2016-12-20 — End: ?

## 2016-12-21 ENCOUNTER — Other Ambulatory Visit: Payer: Self-pay | Admitting: Family Medicine

## 2016-12-21 MED FILL — LORazepam 0.5 MG TABS: 0.5 | 30 days supply | Qty: 150 | Fill #3

## 2016-12-21 MED FILL — NexIUM 40 MG CPDR: 40 | 90 days supply | Qty: 90 | Fill #0

## 2016-12-23 ENCOUNTER — Ambulatory Visit (INDEPENDENT_AMBULATORY_CARE_PROVIDER_SITE_OTHER): Payer: PPO | Admitting: Family Medicine

## 2016-12-23 ENCOUNTER — Encounter: Payer: Self-pay | Admitting: Family Medicine

## 2016-12-23 VITALS — BP 130/66 | HR 62 | Temp 98.1°F | Resp 16 | Ht 70.0 in | Wt 237.6 lb

## 2016-12-23 DIAGNOSIS — N400 Enlarged prostate without lower urinary tract symptoms: Secondary | ICD-10-CM

## 2016-12-23 DIAGNOSIS — E785 Hyperlipidemia, unspecified: Secondary | ICD-10-CM

## 2016-12-23 DIAGNOSIS — F3181 Bipolar II disorder: Secondary | ICD-10-CM | POA: Diagnosis not present

## 2016-12-23 DIAGNOSIS — M79604 Pain in right leg: Secondary | ICD-10-CM

## 2016-12-23 DIAGNOSIS — R252 Cramp and spasm: Secondary | ICD-10-CM | POA: Diagnosis not present

## 2016-12-23 DIAGNOSIS — M79605 Pain in left leg: Secondary | ICD-10-CM | POA: Diagnosis not present

## 2016-12-23 NOTE — Progress Notes (Signed)
Patient ID: William [name], male   DOB: 1940-06-27, 76 y.o.   MRN: [id number]     Subjective:  I acted as a Education administrator for Dr. Carollee Herter.  William [name], [place]   Patient ID: William [name], male    DOB: 1940-06-20, 76 y.o.   MRN: [id number]  Chief Complaint  Patient presents with  . Leg Pain    Leg Pain   There was no injury mechanism. The quality of the pain is described as aching. The pain has been constant since onset. Associated symptoms include muscle weakness. Pertinent negatives include no inability to bear weight, loss of motion, loss of sensation, numbness or tingling. The symptoms are aggravated by weight bearing. He has tried nothing for the symptoms.    Patient is in today for pain in both legs for 3 days. He is also here for f/u cholesterol   Patient Care Team: Carollee Herter, Alferd Apa, DO as PCP - General Stanford Breed Denice Bors, MD as Attending Physician (Cardiology) Norma Fredrickson, MD as Consulting Physician (Psychiatry) [name], MD (Dermatology) Franchot Gallo, MD as Consulting Physician (Urology)   Past Medical History:  Diagnosis Date  . ANXIETY   . Arthritis    low back  . Bladder stone   . BPH (benign prostatic hypertrophy)   . Carotid artery occlusion   . 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 CVD (cerebrovascular disease)   . Eczema   . GERD (gastroesophageal reflux disease)   . History of carotid artery stenosis    S/P BILATERAL CEA  . 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 Hyperlipidemia   . Hypertension   . Nocturia   . S/P carotid endarterectomy    BILATERAL ICA--  PATENT PER DUPLEX  05-19-2012  . Stroke [facility]) 1996   pt states no weaknes but has severe anxiety  . Urinary frequency   . Vitamin D deficiency     Past Surgical History:  Procedure Laterality Date  . APPENDECTOMY  AS CHILD  . CARDIOVASCULAR STRESS TEST  03-27-2012  DR CRENSHAW   LOW RISK LEXISCAN STUDY-- PROBABLE NORMAL PERFUSION AND SOFT  TISSUE ATTENUATION/  NO ISCHEMIA/ EF 51%  . CAROTID ENDARTERECTOMY Bilateral LEFT  11-12-2008  DR GREG HAYES   RIGHT ICA  2006  ([facility])  . CYSTOSCOPY WITH LITHOLAPAXY N/A 02/26/2013   Procedure: CYSTOSCOPY WITH LITHOLAPAXY;  Surgeon: Franchot Gallo, MD;  Location: [facility];  Service: Urology;  Laterality: N/A;  . EYE SURGERY  Jan. 2016   cataract surgery both eyes  . INGUINAL HERNIA REPAIR Right 11-08-2006  . MASS EXCISION N/A 03/03/2016   Procedure: EXCISION OF BACK  MASS;  Surgeon: Stark Klein, MD;  Location: [place];  Service: General;  Laterality: N/A;  . 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 PROSTATE SURGERY    . TRANSURETHRAL RESECTION OF PROSTATE N/A 02/26/2013   Procedure: TRANSURETHRAL RESECTION OF THE PROSTATE WITH GYRUS INSTRUMENTS;  Surgeon: Franchot Gallo, MD;  Location: [facility];  Service: Urology;  Laterality: N/A;    Family History  Problem Relation Age of Onset  . Heart disease Mother        CHF  . Bipolar disorder Mother   . Heart disease Father        CHF    Social History   Social History  . Marital status: Married    Spouse name: N/A  .  Number of children: 2  . Years of education: N/A   Occupational History  .  Retired   Social History Main Topics  . Smoking status: Former Smoker    Packs/day: 2.00    Years: 40.00    Types: Cigarettes    Quit date: 02/15/2005  . Smokeless tobacco: Never Used  . Alcohol use 0.0 oz/week     Comment: Occasional  . Drug use: No  . Sexual activity: Yes    Partners: Female   Other Topics Concern  . Not on file   Social History Narrative   Exercise--  Walks dogs everyday    Outpatient Medications Prior to Visit  Medication Sig Dispense Refill  . aspirin 325 MG EC tablet Take 325 mg by mouth daily.    . B Complex Vitamins (VITAMIN B COMPLEX PO) Take by mouth daily.      . Calcium Carb-Cholecalciferol (CALCIUM 1000 + D PO) Take by mouth  daily.      . carbamazepine (TEGRETOL-XR) 100 MG 12 hr tablet 1 qam  1  qhs 60 tablet 8  . Cholecalciferol (VITAMIN D) 2000 UNITS CAPS Take by mouth.    . Eszopiclone 3 MG TABS TAKE 1 TABLET BY MOUTH AT BEDTIME AS NEEDED TAKE IMMEDIATELY BEFORE BEDTIME 30 tablet 1  . fenofibrate 160 MG tablet TAKE 1 TABLET BY MOUTH DAILY. REPEAT LABS ARE DUE NOW 90 tablet 0  . Flaxseed, Linseed, (FLAX SEED OIL PO) Take by mouth daily.      [name] levocetirizine (XYZAL) 5 MG tablet Take 1 tablet (5 mg total) by mouth every evening. (Patient taking differently: Take 5 mg by mouth daily as needed. ) 30 tablet 5  . lisinopril (PRINIVIL,ZESTRIL) 20 MG tablet Take 1 tablet (20 mg total) by mouth daily. 90 tablet 3  . LORazepam (ATIVAN) 0.5 MG tablet 2 bid  1  qday  prn 150 tablet 5  . Multiple Vitamin (MULTIVITAMIN) tablet Take 1 tablet by mouth daily.    [name] NEXIUM 40 MG capsule TAKE 1 CAPSULE BY MOUTH ONCE DAILY AS NEEDED 90 capsule 0  . rosuvastatin (CRESTOR) 40 MG tablet TAKE 1 TABLET BY MOUTH DAILY 90 tablet 1  . tamsulosin (FLOMAX) 0.4 MG CAPS capsule 1 po qd 30 capsule    No facility-administered medications prior to visit.     Allergies  Allergen Reactions  . Adhesive [Tape] Other (See Comments)    BLISTER  . Strawberry (Diagnostic) Hives    Review of Systems  Constitutional: Negative for fever and malaise/fatigue.  HENT: Negative for congestion.   Eyes: Negative for blurred vision.  Respiratory: Negative for cough and shortness of breath.   Cardiovascular: Negative for chest pain, palpitations and leg swelling.  Gastrointestinal: Negative for vomiting.  Musculoskeletal: Negative for back pain.  Skin: Negative for rash.  Neurological: Negative for tingling, loss of consciousness, numbness and headaches.       Objective:    Physical Exam  Constitutional: He is oriented to person, place, and time. Vital signs are normal. He appears well-developed and well-nourished. He is sleeping.  HENT:  Head:  Normocephalic and atraumatic.  Mouth/Throat: Oropharynx is clear and moist.  Eyes: Pupils are equal, round, and reactive to light. EOM are normal.  Neck: Normal range of motion. Neck supple. No thyromegaly present.  Cardiovascular: Normal rate and regular rhythm.   No murmur heard. Pulmonary/Chest: Effort normal and breath sounds normal. No respiratory distress. He has no wheezes. He has no rales. He exhibits no tenderness.  Musculoskeletal: Normal range of motion. He exhibits no edema, tenderness or deformity.  Neurological: He is alert and oriented to person, place, and time.  Skin: Skin is warm and dry.  Psychiatric: He has a normal mood and affect. His behavior is normal. Judgment and thought content normal.  Nursing note and vitals reviewed.   BP 130/66 (BP Location: Left Arm, Cuff Size: Normal)   Pulse 62   Temp 98.1 F (36.7 C) (Oral)   Resp 16   Ht 5\' 10"  (1.778 m)   Wt 237 lb 9.6 oz (107.8 kg)   SpO2 96%   BMI 34.09 kg/m  Wt Readings from Last 3 Encounters:  12/23/16 237 lb 9.6 oz (107.8 kg)  06/22/16 233 lb 12.8 oz (106.1 kg)  06/01/16 234 lb 6 oz (106.3 kg)   BP Readings from Last 3 Encounters:  12/23/16 130/66  06/22/16 (!) 144/75  06/01/16 (!) 122/59     Immunization History  Administered Date(s) Administered  . Influenza Split 03/23/2011, 03/13/2012  . Influenza Whole 04/20/2007, 03/01/2008, 04/01/2009, 03/27/2010  . Influenza,inj,Quad PF,36+ Mos 04/03/2013, 03/06/2014, 03/03/2015, 04/20/2016  . Pneumococcal Conjugate-13 06/25/2013  . Pneumococcal Polysaccharide-23 05/30/2009  . Tdap 10/19/2011  . Zoster 05/30/2009    Health Maintenance  Topic Date Due  . INFLUENZA VACCINE  01/12/2017  . COLONOSCOPY  02/27/2020  . TETANUS/TDAP  10/18/2021  . PNA vac Low Risk Adult  Completed    Lab Results  Component Value Date   WBC 7.5 12/24/2016   HGB 13.0 12/24/2016   HCT 38.1 (L) 12/24/2016   PLT 219.0 12/24/2016   GLUCOSE 109 (H) 12/24/2016   CHOL 142  12/24/2016   TRIG 86.0 12/24/2016   HDL 53.00 12/24/2016   LDLCALC 72 12/24/2016   ALT 22 12/24/2016   AST 21 12/24/2016   NA 141 12/24/2016   K 4.5 12/24/2016   CL 107 12/24/2016   CREATININE 1.52 (H) 12/24/2016   BUN 19 12/24/2016   CO2 25 12/24/2016   TSH 1.668 10/16/2013   PSA 2.10 12/24/2016   INR 1.0 11/08/2008   HGBA1C 6.1 06/27/2014    Lab Results  Component Value Date   TSH 1.668 10/16/2013   Lab Results  Component Value Date   WBC 7.5 12/24/2016   HGB 13.0 12/24/2016   HCT 38.1 (L) 12/24/2016   MCV 91.6 12/24/2016   PLT 219.0 12/24/2016   Lab Results  Component Value Date   NA 141 12/24/2016   K 4.5 12/24/2016   CO2 25 12/24/2016   GLUCOSE 109 (H) 12/24/2016   BUN 19 12/24/2016   CREATININE 1.52 (H) 12/24/2016   BILITOT 0.4 12/24/2016   ALKPHOS 51 12/24/2016   AST 21 12/24/2016   ALT 22 12/24/2016   PROT 6.8 12/24/2016   ALBUMIN 3.8 12/24/2016   CALCIUM 9.6 12/24/2016   ANIONGAP 8 03/01/2016   GFR 47.83 (L) 12/24/2016   Lab Results  Component Value Date   CHOL 142 12/24/2016   Lab Results  Component Value Date   HDL 53.00 12/24/2016   Lab Results  Component Value Date   LDLCALC 72 12/24/2016   Lab Results  Component Value Date   TRIG 86.0 12/24/2016   Lab Results  Component Value Date   CHOLHDL 3 12/24/2016   Lab Results  Component Value Date   HGBA1C 6.1 06/27/2014         Assessment & Plan:   Problem List Items Addressed This Visit      Unprioritized   Hyperlipidemia  LDL goal <100 - Primary    Tolerating statin, encouraged heart healthy diet, avoid trans fats, minimize simple carbs and saturated fats. Increase exercise as tolerated---- pt also on fenofibrate      Relevant Orders   CBC with Differential/Platelet (Completed)   Lipid panel (Completed)   Comprehensive metabolic panel (Completed)   POCT Urinalysis Dipstick (Automated)    Other Visit Diagnoses    Benign prostatic hyperplasia without lower urinary tract  symptoms       Relevant Orders   PSA (Completed)   CBC with Differential/Platelet (Completed)   POCT Urinalysis Dipstick (Automated)   Bipolar II disorder (HCC)       Relevant Orders   CBC with Differential/Platelet (Completed)   POCT Urinalysis Dipstick (Automated)   Carbamazepine Level (Tegretol), total (Completed)   Leg cramps       Relevant Orders   Magnesium (Completed)   Phosphorus (Completed)   VAS [place] ABI WITH/WO TBI   Pain in both lower extremities       Relevant Orders   VAS [place] ABI WITH/WO TBI      I am having Mr. Ky maintain his (Flaxseed, Linseed, (FLAX SEED OIL PO)), B Complex Vitamins (VITAMIN B COMPLEX PO), Calcium Carb-Cholecalciferol (CALCIUM 1000 + D PO), aspirin, multivitamin, Vitamin D, tamsulosin, lisinopril, levocetirizine, rosuvastatin, carbamazepine, LORazepam, Eszopiclone, fenofibrate, NEXIUM, and fluticasone.  Meds ordered this encounter  Medications  . fluticasone (CUTIVATE) 0.05 % cream    Refill:  3    CMA served as scribe during this visit. History, Physical and Plan performed by medical provider. Documentation and orders reviewed and attested to.  Ann Held, DO

## 2016-12-23 NOTE — Patient Instructions (Signed)
Leg Cramps Leg cramps occur when a muscle or muscles tighten and you have no control over this tightening (involuntary muscle contraction). Muscle cramps can develop in any muscle, but the most common place is in the calf muscles of the leg. Those cramps can occur during exercise or when you are at rest. Leg cramps are painful, and they may last for a few seconds to a few minutes. Cramps may return several times before they finally stop. Usually, leg cramps are not caused by a serious medical problem. In many cases, the cause is not known. Some common causes include:  Overexertion.  Overuse from repetitive motions, or doing the same thing over and over.  Remaining in a certain position for a long period of time.  Improper preparation, form, or technique while performing a sport or an activity.  Dehydration.  Injury.  Side effects of some medicines.  Abnormally low levels of the salts and ions in your blood (electrolytes), especially potassium and calcium. These levels could be low if you are taking water pills (diuretics) or if you are pregnant.  Follow these instructions at home: Watch your condition for any changes. Taking the following actions may help to lessen any discomfort that you are feeling:  Stay well-hydrated. Drink enough fluid to keep your urine clear or pale yellow.  Try massaging, stretching, and relaxing the affected muscle. Do this for several minutes at a time.  For tight or tense muscles, use a warm towel, heating pad, or hot shower water directed to the affected area.  If you are sore or have pain after a cramp, applying ice to the affected area may relieve discomfort. ? Put ice in a plastic bag. ? Place a towel between your skin and the bag. ? Leave the ice on for 20 minutes, 2-3 times per day.  Avoid strenuous exercise for several days if you have been having frequent leg cramps.  Make sure that your diet includes the essential minerals for your muscles to  work normally.  Take medicines only as directed by your health care provider.  Contact a health care provider if:  Your leg cramps get more severe or more frequent, or they do not improve over time.  Your foot becomes cold, numb, or blue. This information is not intended to replace advice given to you by your health care provider. Make sure you discuss any questions you have with your health care provider. Document Released: 07/08/2004 Document Revised: 11/06/2015 Document Reviewed: 05/08/2014 Elsevier Interactive Patient Education  2018 [employer]  

## 2016-12-24 ENCOUNTER — Other Ambulatory Visit (INDEPENDENT_AMBULATORY_CARE_PROVIDER_SITE_OTHER): Payer: PPO

## 2016-12-24 DIAGNOSIS — E785 Hyperlipidemia, unspecified: Secondary | ICD-10-CM | POA: Diagnosis not present

## 2016-12-24 DIAGNOSIS — F3181 Bipolar II disorder: Secondary | ICD-10-CM | POA: Diagnosis not present

## 2016-12-24 DIAGNOSIS — R252 Cramp and spasm: Secondary | ICD-10-CM | POA: Diagnosis not present

## 2016-12-24 DIAGNOSIS — N400 Enlarged prostate without lower urinary tract symptoms: Secondary | ICD-10-CM | POA: Diagnosis not present

## 2016-12-24 LAB — COMPREHENSIVE METABOLIC PANEL
ALK PHOS: 51 U/L (ref 39–117)
ALT: 22 U/L (ref 0–53)
AST: 21 U/L (ref 0–37)
Albumin: 3.8 g/dL (ref 3.5–5.2)
BUN: 19 mg/dL (ref 6–23)
CO2: 25 mEq/L (ref 19–32)
Calcium: 9.6 mg/dL (ref 8.4–10.5)
Chloride: 107 mEq/L (ref 96–112)
Creatinine, Ser: 1.52 mg/dL — ABNORMAL HIGH (ref 0.40–1.50)
GFR: 47.83 mL/min — AB (ref >60.00)
Glucose, Bld: 109 mg/dL — ABNORMAL HIGH (ref 70–99)
POTASSIUM: 4.5 meq/L (ref 3.5–5.1)
Sodium: 141 mEq/L (ref 135–145)
TOTAL PROTEIN: 6.8 g/dL (ref 6.0–8.3)
Total Bilirubin: 0.4 mg/dL (ref 0.2–1.2)

## 2016-12-24 LAB — CBC WITH DIFFERENTIAL/PLATELET
BASOS ABS: 0.1 10*3/uL (ref 0.0–0.1)
Basophils Relative: 0.7 % (ref 0.0–3.0)
Eosinophils Absolute: 0.2 10*3/uL (ref 0.0–0.7)
Eosinophils Relative: 2.6 % (ref 0.0–5.0)
HCT: 38.1 % — ABNORMAL LOW (ref 39.0–52.0)
HEMOGLOBIN: 13 g/dL (ref 13.0–17.0)
LYMPHS ABS: 2.2 10*3/uL (ref 0.7–4.0)
Lymphocytes Relative: 29.1 % (ref 12.0–46.0)
MCHC: 34.1 g/dL (ref 30.0–36.0)
MCV: 91.6 fl (ref 78.0–100.0)
MONOS PCT: 7.2 % (ref 3.0–12.0)
Monocytes Absolute: 0.5 10*3/uL (ref 0.1–1.0)
NEUTROS PCT: 60.4 % (ref 43.0–77.0)
Neutro Abs: 4.5 10*3/uL (ref 1.4–7.7)
Platelets: 219 10*3/uL (ref 150.0–400.0)
RBC: 4.16 Mil/uL — AB (ref 4.22–5.81)
RDW: 14.2 % (ref 11.5–15.5)
WBC: 7.5 10*3/uL (ref 4.0–10.5)

## 2016-12-24 LAB — LIPID PANEL
CHOLESTEROL: 142 mg/dL (ref 0–200)
HDL: 53 mg/dL (ref >39.00)
LDL Cholesterol: 72 mg/dL (ref 0–99)
NONHDL: 89.21
Total CHOL/HDL Ratio: 3
Triglycerides: 86 mg/dL (ref 0.0–149.0)
VLDL: 17.2 mg/dL (ref 0.0–40.0)

## 2016-12-24 LAB — PHOSPHORUS: Phosphorus: 3.1 mg/dL (ref 2.3–4.6)

## 2016-12-24 LAB — MAGNESIUM: MAGNESIUM: 1.8 mg/dL (ref 1.5–2.5)

## 2016-12-24 LAB — PSA: PSA: 2.1 ng/mL (ref 0.10–4.00)

## 2016-12-25 LAB — CARBAMAZEPINE LEVEL, TOTAL: CARBAMAZEPINE LVL: 4.2 mg/L (ref 4.0–12.0)

## 2016-12-26 NOTE — Assessment & Plan Note (Signed)
Tolerating statin, encouraged heart healthy diet, avoid trans fats, minimize simple carbs and saturated fats. Increase exercise as tolerated---- pt also on fenofibrate

## 2016-12-27 ENCOUNTER — Ambulatory Visit (HOSPITAL_COMMUNITY)
Admission: RE | Admit: 2016-12-27 | Discharge: 2016-12-27 | Disposition: A | Discharge status: Home / Self Care | Payer: PPO | Source: Ambulatory Visit | Attending: Family Medicine | Admitting: Family Medicine

## 2016-12-27 ENCOUNTER — Encounter: Payer: Self-pay | Admitting: Family Medicine

## 2016-12-27 DIAGNOSIS — M79605 Pain in left leg: Secondary | ICD-10-CM | POA: Insufficient documentation

## 2016-12-27 DIAGNOSIS — R252 Cramp and spasm: Secondary | ICD-10-CM | POA: Diagnosis not present

## 2016-12-27 DIAGNOSIS — M79604 Pain in right leg: Secondary | ICD-10-CM | POA: Diagnosis not present

## 2016-12-27 NOTE — Progress Notes (Signed)
Ankle Brachial Index  Right brachial pressure, 164 Left brachial pressure, 165  Lower  Extremity Right Left  Dorsalis Pedis 180, Tri 175, Tri  Posterior Tibial 185, Tri 172, Tri  Ankle/Brachial Indices 1.1 1.06    Findings:  Bilateral ABI's fall with in normal limits.  Lita Mains- RDMS, RVT 2:45 PM  12/27/2016

## 2016-12-28 ENCOUNTER — Other Ambulatory Visit: Payer: Self-pay | Admitting: Family Medicine

## 2016-12-28 DIAGNOSIS — N289 Disorder of kidney and ureter, unspecified: Secondary | ICD-10-CM

## 2016-12-28 MED FILL — CARBAMAZEPINE ER 100 MG TAB: 100 | 30 days supply | Qty: 60 | Fill #1

## 2016-12-28 NOTE — Telephone Encounter (Signed)
bp is good Ship broker for now Will check crp Recheck bmp in 2 weeks with cpk

## 2016-12-29 ENCOUNTER — Other Ambulatory Visit (INDEPENDENT_AMBULATORY_CARE_PROVIDER_SITE_OTHER): Payer: PPO

## 2016-12-29 DIAGNOSIS — N289 Disorder of kidney and ureter, unspecified: Secondary | ICD-10-CM

## 2016-12-29 LAB — BASIC METABOLIC PANEL
BUN: 21 mg/dL (ref 6–23)
CHLORIDE: 109 meq/L (ref 96–112)
CO2: 24 mEq/L (ref 19–32)
Calcium: 9.5 mg/dL (ref 8.4–10.5)
Creatinine, Ser: 1.37 mg/dL (ref 0.40–1.50)
GFR: 53.92 mL/min — ABNORMAL LOW (ref >60.00)
Glucose, Bld: 103 mg/dL — ABNORMAL HIGH (ref 70–99)
POTASSIUM: 4 meq/L (ref 3.5–5.1)
SODIUM: 141 meq/L (ref 135–145)

## 2016-12-29 LAB — SEDIMENTATION RATE: SED RATE: 31 mm/h — AB (ref 0–20)

## 2016-12-29 LAB — CK: Total CK: 34 U/L (ref 7–232)

## 2016-12-30 ENCOUNTER — Encounter: Payer: Self-pay | Admitting: Family Medicine

## 2016-12-31 ENCOUNTER — Ambulatory Visit (INDEPENDENT_AMBULATORY_CARE_PROVIDER_SITE_OTHER): Payer: PPO | Admitting: Psychiatry

## 2016-12-31 ENCOUNTER — Encounter: Primary: Internal Medicine

## 2016-12-31 DIAGNOSIS — Z79899 Other long term (current) drug therapy: Secondary | ICD-10-CM | POA: Diagnosis not present

## 2016-12-31 DIAGNOSIS — Z87891 Personal history of nicotine dependence: Secondary | ICD-10-CM | POA: Diagnosis not present

## 2016-12-31 DIAGNOSIS — F0630 Mood disorder due to known physiological condition, unspecified: Secondary | ICD-10-CM | POA: Diagnosis not present

## 2016-12-31 MED ORDER — CARBAMAZEPINE ER 100 MG PO TB12: ORAL_TABLET | ORAL | 8 refills | Status: DC

## 2016-12-31 MED ORDER — ZOLPIDEM TARTRATE 10 MG PO TABS: 10.0000 mg | ORAL_TABLET | Freq: Every evening | ORAL | 5 refills | Status: AC | PRN

## 2016-12-31 MED ORDER — LORAZEPAM 0.5 MG PO TABS: ORAL_TABLET | ORAL | 5 refills | Status: DC

## 2016-12-31 MED FILL — ZOLPIDEM TARTRATE 10 MG TAB: 10 | 30 days supply | Qty: 30 | Fill #0

## 2016-12-31 NOTE — Progress Notes (Signed)
Patient ID: William [name], male   DOB: 26-Dec-1940, 76 y.o.   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 He's had some medical problems. In the last week he was having trouble with his legs. Is really both of his legs but actually it's gotten better. A lot he was being evaluated he had some blood work that showed some evidence of renal dysfunction. He is an appointment with a nephrologist in the next few weeks. Is not clear what this could be from. He does have hypertension but is well-controlled. He does not have diabetes. He's taking no medications for me at this time that would affect his kidneys. Other than this though he feels well. He is happy. His mood is good. He is a good lifestyle. He lives happily with his wife who is who she seen with today. She says he is doing well. He is sleeping and eating well. He's got good energy. He sometimes gets a headache after taking William [name] and his wife asked if he could change it back to William [name]. The patient has no evidence of psychosis. He is not suicidal. His mood is even. Uses no drugs no alcohol.  Past Medical History:  Past Medical History:  Diagnosis Date  . ANXIETY   . Arthritis    low back  . Bladder stone   . BPH (benign prostatic hypertrophy)   . Carotid artery occlusion   . 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 CVD (cerebrovascular disease)   . Eczema   . GERD (gastroesophageal reflux disease)   . History of carotid artery stenosis    S/P BILATERAL CEA  . 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 Hyperlipidemia   . Hypertension   . Nocturia   . S/P carotid endarterectomy    BILATERAL ICA--  PATENT PER DUPLEX  05-19-2012  . Stroke [facility]) 1996   pt states no weaknes but has severe anxiety  . Urinary frequency   . Vitamin D deficiency      Past Surgical History:  Procedure Laterality Date  . APPENDECTOMY  AS CHILD  . CARDIOVASCULAR STRESS TEST  03-27-2012  William CRENSHAW   LOW RISK LEXISCAN STUDY-- PROBABLE NORMAL PERFUSION AND SOFT TISSUE ATTENUATION/  NO ISCHEMIA/ EF 51%  . CAROTID ENDARTERECTOMY Bilateral LEFT  11-12-2008  William GREG HAYES   RIGHT ICA  2006  ([facility])  . CYSTOSCOPY WITH LITHOLAPAXY N/A 02/26/2013   Procedure: CYSTOSCOPY WITH LITHOLAPAXY;  Surgeon: William Gallo, MD;  Location: [name];  Service: Urology;  Laterality: N/A;  . EYE SURGERY  Jan. 2016   cataract surgery both eyes  . INGUINAL HERNIA REPAIR Right 11-08-2006  . MASS EXCISION N/A 03/03/2016   Procedure: EXCISION OF BACK  MASS;  Surgeon: William Klein, MD;  Location: [name];  Service: General;  Laterality: N/A;  . MOHS SURGERY Left 1/ 2016   William Nevada Crane-- Basal cell  . PROSTATE SURGERY    . TRANSURETHRAL RESECTION OF PROSTATE N/A 02/26/2013   Procedure: TRANSURETHRAL RESECTION OF THE PROSTATE WITH GYRUS INSTRUMENTS;  Surgeon: William Gallo, MD;  Location: [facility];  Service: Urology;  Laterality: N/A;   Family History:  Family History  Problem Relation Age of Onset  . Heart disease Mother  CHF  . Bipolar disorder Mother   . Heart disease Father        CHF   Family Psychiatric  History:  Social History:  History  Alcohol Use  . 0.0 oz/week    Comment: Occasional     History  Drug Use No    Social History   Social History  . Marital status: Married    Spouse name: N/A  . Number of children: 2  . Years of education: N/A   Occupational History  .  Retired   Social History Main Topics  . Smoking status: Former Smoker    Packs/day: 2.00    Years: 40.00    Types: Cigarettes    Quit date: 02/15/2005  . Smokeless tobacco: Never Used  . Alcohol use 0.0 oz/week     Comment: Occasional  . Drug use: No  . Sexual activity: Yes    Partners: Female   Other Topics Concern   . Not on file   Social History Narrative   Exercise--  [name]   Additional Social History:                         Sleep: Good  Appetite:  Good  Current Medications: Current Outpatient Prescriptions  Medication Sig Dispense Refill  . aspirin 325 MG EC tablet Take 325 mg by mouth daily.    . B Complex Vitamins (VITAMIN B COMPLEX PO) Take by mouth daily.      . Calcium Carb-Cholecalciferol (CALCIUM 1000 + D PO) Take by mouth daily.      . carbamazepine (TEGRETOL-XR) 100 MG 12 hr tablet 1 qam  1  qhs 60 tablet 8  . Cholecalciferol (VITAMIN D) 2000 UNITS CAPS Take by mouth.    . Eszopiclone 3 MG TABS TAKE 1 TABLET BY MOUTH AT BEDTIME AS NEEDED TAKE IMMEDIATELY BEFORE BEDTIME 30 tablet 1  . fenofibrate 160 MG tablet TAKE 1 TABLET BY MOUTH DAILY. REPEAT LABS ARE DUE NOW 90 tablet 0  . Flaxseed, Linseed, (FLAX SEED OIL PO) Take by mouth daily.      . fluticasone (CUTIVATE) 0.05 % cream   3  . levocetirizine (XYZAL) 5 MG tablet Take 1 tablet (5 mg total) by mouth every evening. (Patient taking differently: Take 5 mg by mouth daily as needed. ) 30 tablet 5  . lisinopril (PRINIVIL,ZESTRIL) 20 MG tablet Take 1 tablet (20 mg total) by mouth daily. 90 tablet 3  . LORazepam (ATIVAN) 0.5 MG tablet 2 bid  1  qday  prn 150 tablet 5  . Multiple Vitamin (MULTIVITAMIN) tablet Take 1 tablet by mouth daily.    [name] NEXIUM 40 MG capsule TAKE 1 CAPSULE BY MOUTH ONCE DAILY AS NEEDED 90 capsule 0  . rosuvastatin (CRESTOR) 40 MG tablet TAKE 1 TABLET BY MOUTH DAILY 90 tablet 1  . tamsulosin (FLOMAX) 0.4 MG CAPS capsule 1 po qd 30 capsule   . zolpidem ([name]) 10 MG tablet Take 1 tablet (10 mg total) by mouth at bedtime as needed for sleep. 30 tablet 5   No current facility-administered medications for this visit.     Lab Results: No results found for this or any previous visit (from the past 48 hour(s)).  Physical Findings: AIMS:  , ,  ,  ,    CIWA:    COWS:      Musculoskeletal: Strength & Muscle Tone: within normal limits Gait & Station: normal Patient leans: Right  Psychiatric  Specialty Exam: ROS  Blood pressure 128/68, pulse 90, height 5\' 10"  (1.778 m), weight 237 lb (107.5 kg).Body mass index is 34.01 kg/m.  General Appearance: Casual  Eye Contact::  Good  Speech:  Clear and Coherent  Volume:  Normal  Mood:  Dysphoric and Euthymic  Affect:  Appropriate  Thought Process:  Coherent  Orientation:  Full (Time, Place, and Person)  Thought Content:  WDL  Suicidal Thoughts:  No  Homicidal Thoughts:  No  Memory:  NA  Judgement:  Good  Insight:  Fair  Psychomotor Activity:  Normal  Concentration:  Good  Recall:  Good  Fund of Knowledge:Good  Language: Good  Akathisia:  No  Handed:  Right  AIMS (if indicated):     Assets:  Desire for Improvement  ADL's:  Intact  Cognition: WNL  Sleep:      Treatment Plan Summary: 12/31/2016, 9:57 AM  At this time the patient will switch from Lunesta to the dose of [name] 10 mg. He will continue taking Tegretol as prescribed. He'll continue taking Ativan 0.5 mg 2 in the morning 2 midday and one extra if he needs it. He'll now start on William [name] as described above. His mood is stable. He'll keep his appointments with the nephrologist and return to see me in 5 months for a full visit. Today reviewed the pros and cons of his medications and agreed to continue taking them.

## 2016-12-31 NOTE — Telephone Encounter (Signed)
Yes -- I know about rhabdo---  That is why I had him come back in for labs which were normal

## 2017-01-03 ENCOUNTER — Inpatient Hospital Stay: Admit: 2017-01-03 | Discharge: 2017-01-04 | Primary: Internal Medicine

## 2017-01-03 DIAGNOSIS — D631 Anemia in chronic kidney disease: Secondary | ICD-10-CM

## 2017-01-03 DIAGNOSIS — D500 Iron deficiency anemia secondary to blood loss (chronic): Secondary | ICD-10-CM

## 2017-01-03 DIAGNOSIS — D696 Thrombocytopenia, unspecified: Secondary | ICD-10-CM

## 2017-01-03 DIAGNOSIS — N184 Chronic kidney disease, stage 4 (severe): Principal | ICD-10-CM

## 2017-01-11 ENCOUNTER — Encounter: Payer: Self-pay | Admitting: Family Medicine

## 2017-01-11 ENCOUNTER — Other Ambulatory Visit: Payer: Self-pay | Admitting: Family Medicine

## 2017-01-11 ENCOUNTER — Other Ambulatory Visit (INDEPENDENT_AMBULATORY_CARE_PROVIDER_SITE_OTHER): Payer: PPO

## 2017-01-11 DIAGNOSIS — N289 Disorder of kidney and ureter, unspecified: Secondary | ICD-10-CM

## 2017-01-11 LAB — CK: Total CK: 45 U/L (ref 7–232)

## 2017-01-11 LAB — BASIC METABOLIC PANEL
BUN: 23 mg/dL (ref 6–23)
CALCIUM: 9 mg/dL (ref 8.4–10.5)
CHLORIDE: 108 meq/L (ref 96–112)
CO2: 27 meq/L (ref 19–32)
CREATININE: 1.33 mg/dL (ref 0.40–1.50)
GFR: 55.79 mL/min — ABNORMAL LOW (ref >60.00)
Glucose, Bld: 104 mg/dL — ABNORMAL HIGH (ref 70–99)
Potassium: 3.8 mEq/L (ref 3.5–5.1)
Sodium: 142 mEq/L (ref 135–145)

## 2017-01-11 MED ORDER — PITAVASTATIN CALCIUM 2 MG PO TABS: 1.0000 | ORAL_TABLET | Freq: Every day | ORAL | 3 refills | Status: DC

## 2017-01-11 MED FILL — LIVALO 2 MG TABLET: 2 | 30 days supply | Qty: 30 | Fill #0

## 2017-01-11 NOTE — Telephone Encounter (Signed)
[name] ph# [phone]

## 2017-01-11 NOTE — Telephone Encounter (Signed)
Wife William [name] called stating they have additional questions/concerns about labs. Please call back.

## 2017-01-11 NOTE — Telephone Encounter (Signed)
Did the muscle aches improve after stopping crestor? If no--- he should restart it  If yes we could consider switching to livalo 2 mg 1 po qd #30  2 refills He can make an appointment to f/u when I get back.  Please call the pt

## 2017-01-14 ENCOUNTER — Encounter: Primary: Internal Medicine

## 2017-01-14 MED ORDER — TAMSULOSIN HCL 0.4 MG PO CAPS
.4 mg | Freq: Every day | ORAL
Start: 2017-01-14 — End: ?

## 2017-01-14 MED ORDER — OXYCODONE-ACETAMINOPHEN 10-325 MG PO TABS
1 | ORAL | PRN
Start: 2017-01-14 — End: ?

## 2017-01-14 MED ORDER — DICLOFENAC SODIUM 1 % TD GEL
4 g | TOPICAL | PRN
Start: 2017-01-14 — End: ?

## 2017-01-14 MED ORDER — FUROSEMIDE 40 MG PO TABS
40 mg | ORAL
Start: 2017-01-14 — End: ?

## 2017-01-14 MED ORDER — LISINOPRIL 10 MG PO TABS
10 mg | Freq: Every day | ORAL
Start: 2017-01-14 — End: ?

## 2017-01-14 MED ORDER — ASPIRIN 325 MG PO TBEC
325 mg | Freq: Every day | ORAL
Start: 2017-01-14 — End: ?

## 2017-01-14 MED ORDER — DOCUSATE CALCIUM 240 MG PO CAPS
240 mg | Freq: Two times a day (BID) | ORAL | PRN
Start: 2017-01-14 — End: ?

## 2017-01-14 MED ORDER — ATORVASTATIN CALCIUM 40 MG PO TABS
40 mg | Freq: Every day | ORAL
Start: 2017-01-14 — End: ?

## 2017-01-14 NOTE — Progress Notes
Reconciled new meds sent in from [facility]. D/c'd duplicate therapy meds.

## 2017-01-17 ENCOUNTER — Inpatient Hospital Stay: Admit: 2017-01-17 | Discharge: 2017-01-18 | Primary: Internal Medicine

## 2017-01-17 DIAGNOSIS — D500 Iron deficiency anemia secondary to blood loss (chronic): Principal | ICD-10-CM

## 2017-01-17 DIAGNOSIS — I6350 Cerebral infarction due to unspecified occlusion or stenosis of unspecified cerebral artery: Secondary | ICD-10-CM

## 2017-01-17 DIAGNOSIS — D631 Anemia in chronic kidney disease: Secondary | ICD-10-CM

## 2017-01-17 DIAGNOSIS — E119 Type 2 diabetes mellitus without complications: Secondary | ICD-10-CM

## 2017-01-17 DIAGNOSIS — F1010 Alcohol abuse, uncomplicated: Secondary | ICD-10-CM

## 2017-01-17 DIAGNOSIS — I639 Cerebral infarction, unspecified: Secondary | ICD-10-CM

## 2017-01-17 DIAGNOSIS — I10 Essential (primary) hypertension: Secondary | ICD-10-CM

## 2017-01-17 DIAGNOSIS — I509 Heart failure, unspecified: Principal | ICD-10-CM

## 2017-01-17 DIAGNOSIS — I4891 Unspecified atrial fibrillation: Secondary | ICD-10-CM

## 2017-01-17 DIAGNOSIS — N184 Chronic kidney disease, stage 4 (severe): Principal | ICD-10-CM

## 2017-01-17 MED ORDER — EPOETIN ALFA 20000 UNIT/ML IJ SOLN
20000 [IU] | Freq: Once | SUBCUTANEOUS | Status: CP
Start: 2017-01-17 — End: ?

## 2017-01-17 MED ORDER — EPOETIN ALFA 20000 UNIT/ML IJ SOLN
20000 [IU] | Freq: Once | SUBCUTANEOUS | Status: CN
Start: 2017-01-17 — End: ?

## 2017-01-17 NOTE — Progress Notes
Per Sharee Pimple, pharmacist ok to give Procrit with pending ferritin lab results.  Hgb 9.7.  Procrit given Sub Q in the left upper arm per patient request.  Patient tolerated injection well and voiced no complaints.  Patient went to checkout to schedule future appointments.  Patient was ambulatory when he left the treatment area

## 2017-01-18 ENCOUNTER — Other Ambulatory Visit: Payer: Self-pay | Admitting: Family Medicine

## 2017-01-18 DIAGNOSIS — I10 Essential (primary) hypertension: Secondary | ICD-10-CM

## 2017-01-18 MED FILL — LISINOPRIL 20 MG TABLET: 20 | 90 days supply | Qty: 90 | Fill #0

## 2017-01-24 ENCOUNTER — Ambulatory Visit (INDEPENDENT_AMBULATORY_CARE_PROVIDER_SITE_OTHER): Payer: PPO | Admitting: Family Medicine

## 2017-01-24 ENCOUNTER — Encounter: Payer: Self-pay | Admitting: Family Medicine

## 2017-01-24 VITALS — BP 120/82 | HR 67 | Temp 97.8°F | Ht 70.0 in | Wt 234.1 lb

## 2017-01-24 DIAGNOSIS — E785 Hyperlipidemia, unspecified: Secondary | ICD-10-CM

## 2017-01-24 DIAGNOSIS — R7989 Other specified abnormal findings of blood chemistry: Secondary | ICD-10-CM

## 2017-01-24 LAB — POC URINALSYSI DIPSTICK (AUTOMATED)
Bilirubin, UA: NEGATIVE
Blood, UA: NEGATIVE
Glucose, UA: NEGATIVE
KETONES UA: NEGATIVE
Leukocytes, UA: NEGATIVE
Nitrite, UA: NEGATIVE
PH UA: 6 (ref 5.0–8.0)
Spec Grav, UA: >=1.03 — AB (ref 1.010–1.025)
Urobilinogen, UA: 0.2 E.U./dL

## 2017-01-24 LAB — COMPREHENSIVE METABOLIC PANEL
ALT: 20 U/L (ref 0–53)
AST: 20 U/L (ref 0–37)
Albumin: 4 g/dL (ref 3.5–5.2)
Alkaline Phosphatase: 53 U/L (ref 39–117)
BILIRUBIN TOTAL: 0.4 mg/dL (ref 0.2–1.2)
BUN: 21 mg/dL (ref 6–23)
CO2: 26 meq/L (ref 19–32)
Calcium: 9.2 mg/dL (ref 8.4–10.5)
Chloride: 107 mEq/L (ref 96–112)
Creatinine, Ser: 1.37 mg/dL (ref 0.40–1.50)
GFR: 53.91 mL/min — AB (ref >60.00)
GLUCOSE: 103 mg/dL — AB (ref 70–99)
Potassium: 4.1 mEq/L (ref 3.5–5.1)
SODIUM: 141 meq/L (ref 135–145)
TOTAL PROTEIN: 6.9 g/dL (ref 6.0–8.3)

## 2017-01-24 NOTE — Progress Notes (Signed)
Pre visit review using our clinic review tool, if applicable. No additional management support is needed unless otherwise documented below in the visit note. 

## 2017-01-24 NOTE — Patient Instructions (Signed)
Cholesterol Cholesterol is a white, waxy, fat-like substance that is needed by the human body in small amounts. The liver makes all the cholesterol we need. Cholesterol is carried from the liver by the blood through the blood vessels. Deposits of cholesterol (plaques) may build up on blood vessel (artery) walls. Plaques make the arteries narrower and stiffer. Cholesterol plaques increase the risk for heart attack and stroke. You cannot feel your cholesterol level even if it is very high. The only way to know that it is high is to have a blood test. Once you know your cholesterol levels, you should keep a record of the test results. Work with your health care provider to keep your levels in the desired range. What do the results mean?  Total cholesterol is a rough measure of all the cholesterol in your blood.  LDL (low-density lipoprotein) is the "bad" cholesterol. This is the type that causes plaque to build up on the artery walls. You want this level to be low.  HDL (high-density lipoprotein) is the "good" cholesterol because it cleans the arteries and carries the LDL away. You want this level to be high.  Triglycerides are fat that the body can either burn for energy or store. High levels are closely linked to heart disease. What are the desired levels of cholesterol?  Total cholesterol below 200.  LDL below 100 for people who are at risk, below 70 for people at very high risk.  HDL above 40 is good. A level of 60 or higher is considered to be protective against heart disease.  Triglycerides below 150. How can I lower my cholesterol? Diet Follow your diet program as told by your health care provider.  Choose fish or white meat chicken and turkey, roasted or baked. Limit fatty cuts of red meat, fried foods, and processed meats, such as sausage and lunch meats.  Eat lots of fresh fruits and vegetables.  Choose whole grains, beans, pasta, potatoes, and cereals.  Choose olive oil, corn  oil, or canola oil, and use only small amounts.  Avoid butter, mayonnaise, shortening, or palm kernel oils.  Avoid foods with trans fats.  Drink skim or nonfat milk and eat low-fat or nonfat yogurt and cheeses. Avoid whole milk, cream, ice cream, egg yolks, and full-fat cheeses.  Healthier desserts include angel food cake, ginger snaps, animal crackers, hard candy, popsicles, and low-fat or nonfat frozen yogurt. Avoid pastries, cakes, pies, and cookies.  Exercise  Follow your exercise program as told by your health care provider. A regular program: ? Helps to decrease LDL and raise HDL. ? Helps with weight control.  Do things that increase your activity level, such as gardening, walking, and taking the stairs.  Ask your health care provider about ways that you can be more active in your daily life.  Medicine  Take over-the-counter and prescription medicines only as told by your health care provider. ? Medicine may be prescribed by your health care provider to help lower cholesterol and decrease the risk for heart disease. This is usually done if diet and exercise have failed to bring down cholesterol levels. ? If you have several risk factors, you may need medicine even if your levels are normal.  This information is not intended to replace advice given to you by your health care provider. Make sure you discuss any questions you have with your health care provider. Document Released: 02/23/2001 Document Revised: 12/27/2015 Document Reviewed: 11/29/2015 Elsevier Interactive Patient Education  2017 Elsevier Inc.  

## 2017-01-24 NOTE — Progress Notes (Signed)
Subjective:    Patient ID: William [name], male    DOB: August 10, 1940, 76 y.o.   MRN: [id number]  Chief Complaint  Patient presents with  . Follow-up    HPI Patient is in today for f/u blood work and muscle aches.  The muscle aches have improved since d/c crestor and so far he is doing well with livalo.     Past Medical History:  Diagnosis Date  . ANXIETY   . Arthritis    low back  . Bladder stone   . BPH (benign prostatic hypertrophy)   . Carotid artery occlusion   . 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 CVD (cerebrovascular disease)   . Eczema   . GERD (gastroesophageal reflux disease)   . History of carotid artery stenosis    S/P BILATERAL CEA  . 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 Hyperlipidemia   . Hypertension   . Nocturia   . S/P carotid endarterectomy    BILATERAL ICA--  PATENT PER DUPLEX  05-19-2012  . Stroke [facility]) 1996   pt states no weaknes but has severe anxiety  . Urinary frequency   . Vitamin D deficiency     Past Surgical History:  Procedure Laterality Date  . APPENDECTOMY  AS CHILD  . CARDIOVASCULAR STRESS TEST  03-27-2012  DR CRENSHAW   LOW RISK LEXISCAN STUDY-- PROBABLE NORMAL PERFUSION AND SOFT TISSUE ATTENUATION/  NO ISCHEMIA/ EF 51%  . CAROTID ENDARTERECTOMY Bilateral LEFT  11-12-2008  DR GREG HAYES   RIGHT ICA  2006  ([facility])  . CYSTOSCOPY WITH LITHOLAPAXY N/A 02/26/2013   Procedure: CYSTOSCOPY WITH LITHOLAPAXY;  Surgeon: Franchot Gallo, MD;  Location: [facility];  Service: Urology;  Laterality: N/A;  . EYE SURGERY  Jan. 2016   cataract surgery both eyes  . INGUINAL HERNIA REPAIR Right 11-08-2006  . MASS EXCISION N/A 03/03/2016   Procedure: EXCISION OF BACK  MASS;  Surgeon: Stark Klein, MD;  Location: [facility];  Service: General;  Laterality: N/A;  . 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 PROSTATE SURGERY    . TRANSURETHRAL RESECTION OF  PROSTATE N/A 02/26/2013   Procedure: TRANSURETHRAL RESECTION OF THE PROSTATE WITH GYRUS INSTRUMENTS;  Surgeon: Franchot Gallo, MD;  Location: [facility];  Service: Urology;  Laterality: N/A;    Family History  Problem Relation Age of Onset  . Heart disease Mother        CHF  . Bipolar disorder Mother   . Heart disease Father        CHF    Social History   Social History  . Marital status: Married    Spouse name: N/A  . Number of children: 2  . Years of education: N/A   Occupational History  .  Retired   Social History Main Topics  . Smoking status: Former Smoker    Packs/day: 2.00    Years: 40.00    Types: Cigarettes    Quit date: 02/15/2005  . Smokeless tobacco: Never Used  . Alcohol use 0.0 oz/week     Comment: Occasional  . Drug use: No  . Sexual activity: Yes    Partners: Female   Other Topics Concern  . Not on file   Social History Narrative   Exercise--  Walks dogs everyday    Outpatient Medications Prior to Visit  Medication Sig Dispense Refill  .  aspirin 325 MG EC tablet Take 325 mg by mouth daily.    . B Complex Vitamins (VITAMIN B COMPLEX PO) Take by mouth daily.      . Calcium Carb-Cholecalciferol (CALCIUM 1000 + D PO) Take by mouth daily.      . carbamazepine (TEGRETOL-XR) 100 MG 12 hr tablet 1 qam  1  qhs 60 tablet 8  . Cholecalciferol (VITAMIN D) 2000 UNITS CAPS Take by mouth.    . Eszopiclone 3 MG TABS TAKE 1 TABLET BY MOUTH AT BEDTIME AS NEEDED TAKE IMMEDIATELY BEFORE BEDTIME 30 tablet 1  . fenofibrate 160 MG tablet TAKE 1 TABLET BY MOUTH DAILY. REPEAT LABS ARE DUE NOW 90 tablet 0  . Flaxseed, Linseed, (FLAX SEED OIL PO) Take by mouth daily.      . fluticasone (CUTIVATE) 0.05 % cream   3  . levocetirizine (XYZAL) 5 MG tablet Take 1 tablet (5 mg total) by mouth every evening. (Patient taking differently: Take 5 mg by mouth daily as needed. ) 30 tablet 5  . lisinopril (PRINIVIL,ZESTRIL) 20 MG tablet TAKE 1 TABLET (20 MG TOTAL) BY  MOUTH DAILY. 90 tablet 3  . LORazepam (ATIVAN) 0.5 MG tablet 2 bid  1  qday  prn 150 tablet 5  . Multiple Vitamin (MULTIVITAMIN) tablet Take 1 tablet by mouth daily.    [name] NEXIUM 40 MG capsule TAKE 1 CAPSULE BY MOUTH ONCE DAILY AS NEEDED 90 capsule 0  . Pitavastatin Calcium (LIVALO) 2 MG TABS Take 1 tablet (2 mg total) by mouth daily. 30 tablet 3  . tamsulosin (FLOMAX) 0.4 MG CAPS capsule 1 po qd 30 capsule   . zolpidem (AMBIEN) 10 MG tablet Take 1 tablet (10 mg total) by mouth at bedtime as needed for sleep. 30 tablet 5   No facility-administered medications prior to visit.     Allergies  Allergen Reactions  . Adhesive [Tape] Other (See Comments)    BLISTER  . Strawberry (Diagnostic) Hives    Review of Systems  Constitutional: Negative for chills, fever and malaise/fatigue.  HENT: Negative for congestion and hearing loss.   Eyes: Negative for discharge.  Respiratory: Negative for cough, sputum production and shortness of breath.   Cardiovascular: Negative for chest pain, palpitations and leg swelling.  Gastrointestinal: Negative for abdominal pain, blood in stool, constipation, diarrhea, heartburn, nausea and vomiting.  Genitourinary: Negative for dysuria, frequency, hematuria and urgency.  Musculoskeletal: Negative for back pain, falls and myalgias.  Skin: Negative for rash.  Neurological: Negative for dizziness, sensory change, loss of consciousness, weakness and headaches.  Endo/Heme/Allergies: Negative for environmental allergies. Does not bruise/bleed easily.  Psychiatric/Behavioral: Negative for depression and suicidal ideas. The patient is not nervous/anxious and does not have insomnia.        Objective:    Physical Exam  Constitutional: He is oriented to person, place, and time. Vital signs are normal. He appears well-developed and well-nourished. He is sleeping.  HENT:  Head: Normocephalic and atraumatic.  Mouth/Throat: Oropharynx is clear and moist.  Eyes: Pupils  are equal, round, and reactive to light. EOM are normal.  Neck: Normal range of motion. Neck supple. No thyromegaly present.  Cardiovascular: Normal rate and regular rhythm.   No murmur heard. Pulmonary/Chest: Effort normal and breath sounds normal. No respiratory distress. He has no wheezes. He has no rales. He exhibits no tenderness.  Musculoskeletal: He exhibits no edema or tenderness.  Neurological: He is alert and oriented to person, place, and time.  Skin: Skin is warm  and dry.  Psychiatric: He has a normal mood and affect. His behavior is normal. Judgment and thought content normal.  Nursing note and vitals reviewed.   BP 120/82 (BP Location: Left Arm, Patient Position: Sitting, Cuff Size: Normal)   Pulse 67   Temp 97.8 F (36.6 C) (Oral)   Ht 5\' 10"  (1.778 m)   Wt 234 lb 2 oz (106.2 kg)   SpO2 96%   BMI 33.59 kg/m  Wt Readings from Last 3 Encounters:  01/24/17 234 lb 2 oz (106.2 kg)  12/23/16 237 lb 9.6 oz (107.8 kg)  06/22/16 233 lb 12.8 oz (106.1 kg)     Lab Results  Component Value Date   WBC 7.5 12/24/2016   HGB 13.0 12/24/2016   HCT 38.1 (L) 12/24/2016   PLT 219.0 12/24/2016   GLUCOSE 103 (H) 01/24/2017   CHOL 142 12/24/2016   TRIG 86.0 12/24/2016   HDL 53.00 12/24/2016   LDLCALC 72 12/24/2016   ALT 20 01/24/2017   AST 20 01/24/2017   NA 141 01/24/2017   K 4.1 01/24/2017   CL 107 01/24/2017   CREATININE 1.37 01/24/2017   BUN 21 01/24/2017   CO2 26 01/24/2017   TSH 1.668 10/16/2013   PSA 2.10 12/24/2016   INR 1.0 11/08/2008   HGBA1C 6.1 06/27/2014    Lab Results  Component Value Date   TSH 1.668 10/16/2013   Lab Results  Component Value Date   WBC 7.5 12/24/2016   HGB 13.0 12/24/2016   HCT 38.1 (L) 12/24/2016   MCV 91.6 12/24/2016   PLT 219.0 12/24/2016   Lab Results  Component Value Date   NA 141 01/24/2017   K 4.1 01/24/2017   CO2 26 01/24/2017   GLUCOSE 103 (H) 01/24/2017   BUN 21 01/24/2017   CREATININE 1.37 01/24/2017   BILITOT  0.4 01/24/2017   ALKPHOS 53 01/24/2017   AST 20 01/24/2017   ALT 20 01/24/2017   PROT 6.9 01/24/2017   ALBUMIN 4.0 01/24/2017   CALCIUM 9.2 01/24/2017   ANIONGAP 8 03/01/2016   GFR 53.91 (L) 01/24/2017   Lab Results  Component Value Date   CHOL 142 12/24/2016   Lab Results  Component Value Date   HDL 53.00 12/24/2016   Lab Results  Component Value Date   LDLCALC 72 12/24/2016   Lab Results  Component Value Date   TRIG 86.0 12/24/2016   Lab Results  Component Value Date   CHOLHDL 3 12/24/2016   Lab Results  Component Value Date   HGBA1C 6.1 06/27/2014       Assessment & Plan:   Problem List Items Addressed This Visit      Unprioritized   Hyperlipidemia   Relevant Orders   Comprehensive metabolic panel   Lipid panel    Other Visit Diagnoses    Elevated serum creatinine    -  Primary   Relevant Orders   Comprehensive metabolic panel (Completed)   POCT Urinalysis Dipstick (Automated) (Completed)    recheck labs today for bun/cr  Recheck lipid in 3 months  I am having [name] maintain his (Flaxseed, Linseed, (FLAX SEED OIL PO)), B Complex Vitamins (VITAMIN B COMPLEX PO), Calcium Carb-Cholecalciferol (CALCIUM 1000 + D PO), aspirin, multivitamin, Vitamin D, tamsulosin, levocetirizine, Eszopiclone, fenofibrate, NEXIUM, fluticasone, zolpidem, carbamazepine, LORazepam, Pitavastatin Calcium, lisinopril, and CoQ10.  Meds ordered this encounter  Medications  . Coenzyme Q10 (COQ10) 200 MG CAPS    Sig: Take 1 capsule by mouth daily.   Alferd Apa  Carollee Herter, DO

## 2017-01-27 ENCOUNTER — Other Ambulatory Visit: Payer: Self-pay | Admitting: Family Medicine

## 2017-01-27 DIAGNOSIS — R739 Hyperglycemia, unspecified: Secondary | ICD-10-CM

## 2017-01-27 DIAGNOSIS — R7989 Other specified abnormal findings of blood chemistry: Secondary | ICD-10-CM

## 2017-01-28 ENCOUNTER — Encounter: Primary: Internal Medicine

## 2017-01-28 ENCOUNTER — Encounter: Attending: Hematology & Oncology | Primary: Internal Medicine

## 2017-01-28 MED FILL — ZOLPIDEM TARTRATE 10 MG TAB: 10 | 30 days supply | Qty: 30 | Fill #1

## 2017-01-31 ENCOUNTER — Ambulatory Visit: Admit: 2017-01-31 | Discharge: 2017-01-31 | Attending: Hematology & Oncology | Primary: Internal Medicine

## 2017-01-31 ENCOUNTER — Inpatient Hospital Stay: Admit: 2017-01-31 | Discharge: 2017-01-31 | Primary: Internal Medicine

## 2017-01-31 DIAGNOSIS — E611 Iron deficiency: Secondary | ICD-10-CM

## 2017-01-31 DIAGNOSIS — N189 Chronic kidney disease, unspecified: Secondary | ICD-10-CM

## 2017-01-31 DIAGNOSIS — I1310 Hypertensive heart and chronic kidney disease without heart failure, with stage 1 through stage 4 chronic kidney disease, or unspecified chronic kidney disease: Secondary | ICD-10-CM

## 2017-01-31 DIAGNOSIS — I4891 Unspecified atrial fibrillation: Secondary | ICD-10-CM

## 2017-01-31 DIAGNOSIS — I509 Heart failure, unspecified: Secondary | ICD-10-CM

## 2017-01-31 DIAGNOSIS — Z7982 Long term (current) use of aspirin: Secondary | ICD-10-CM

## 2017-01-31 DIAGNOSIS — I6350 Cerebral infarction due to unspecified occlusion or stenosis of unspecified cerebral artery: Secondary | ICD-10-CM

## 2017-01-31 DIAGNOSIS — K5730 Diverticulosis of large intestine without perforation or abscess without bleeding: Secondary | ICD-10-CM

## 2017-01-31 DIAGNOSIS — E119 Type 2 diabetes mellitus without complications: Secondary | ICD-10-CM

## 2017-01-31 DIAGNOSIS — E1122 Type 2 diabetes mellitus with diabetic chronic kidney disease: Secondary | ICD-10-CM

## 2017-01-31 DIAGNOSIS — F1010 Alcohol abuse, uncomplicated: Secondary | ICD-10-CM

## 2017-01-31 DIAGNOSIS — I2510 Atherosclerotic heart disease of native coronary artery without angina pectoris: Secondary | ICD-10-CM

## 2017-01-31 DIAGNOSIS — N184 Chronic kidney disease, stage 4 (severe): Secondary | ICD-10-CM

## 2017-01-31 DIAGNOSIS — I639 Cerebral infarction, unspecified: Secondary | ICD-10-CM

## 2017-01-31 DIAGNOSIS — Z955 Presence of coronary angioplasty implant and graft: Secondary | ICD-10-CM

## 2017-01-31 DIAGNOSIS — D509 Iron deficiency anemia, unspecified: Secondary | ICD-10-CM

## 2017-01-31 DIAGNOSIS — Z86010 Personal history of colonic polyps: Secondary | ICD-10-CM

## 2017-01-31 DIAGNOSIS — E785 Hyperlipidemia, unspecified: Secondary | ICD-10-CM

## 2017-01-31 DIAGNOSIS — D631 Anemia in chronic kidney disease: Secondary | ICD-10-CM

## 2017-01-31 DIAGNOSIS — D696 Thrombocytopenia, unspecified: Principal | ICD-10-CM

## 2017-01-31 DIAGNOSIS — I10 Essential (primary) hypertension: Secondary | ICD-10-CM

## 2017-01-31 MED ORDER — MEPERIDINE HCL 25 MG/ML IJ SOLN
25 mg | INTRAVENOUS | Status: CN | PRN
Start: 2017-01-31 — End: ?

## 2017-01-31 MED ORDER — EPINEPHRINE 0.3 MG/0.3ML IJ SOAJ
0.3 mg | INTRAMUSCULAR | Status: CN | PRN
Start: 2017-01-31 — End: ?

## 2017-01-31 MED ORDER — HEPARIN SODIUM LOCK FLUSH 100 UNIT/ML IV SOLN
500 [IU] | Status: CN | PRN
Start: 2017-01-31 — End: ?

## 2017-01-31 MED ORDER — SODIUM CHLORIDE 0.9 % IV SOLN
Freq: Once | INTRAVENOUS | Status: CN
Start: 2017-01-31 — End: ?

## 2017-01-31 MED ORDER — DEXAMETHASONE SODIUM PHOSPHATE 20 MG/5ML IJ SOLN
20 mg | INTRAVENOUS | Status: CN | PRN
Start: 2017-01-31 — End: ?

## 2017-01-31 MED ORDER — DIPHENHYDRAMINE HCL 50 MG/ML IJ SOLN
25 mg | INTRAVENOUS | Status: CN | PRN
Start: 2017-01-31 — End: ?

## 2017-01-31 MED ORDER — FERRIC CARBOXYMALTOSE IVPB
750 mg | Freq: Once | INTRAVENOUS | Status: CN
Start: 2017-01-31 — End: ?

## 2017-01-31 MED ORDER — HYDROCORTISONE NA SUCCINATE PF 100 MG IJ SOLR
100 mg | INTRAVENOUS | Status: CN | PRN
Start: 2017-01-31 — End: ?

## 2017-01-31 MED ORDER — SODIUM CHLORIDE FLUSH 0.9 % IV SOLN
10 mL | Status: CN | PRN
Start: 2017-01-31 — End: ?

## 2017-01-31 MED FILL — TAMSULOSIN HCL 0.4 MG CAP: 0.4 | 90 days supply | Qty: 90 | Fill #2

## 2017-01-31 MED FILL — CARBAMAZEPINE ER 100 MG TAB: 100 | 30 days supply | Qty: 60 | Fill #2

## 2017-01-31 NOTE — Progress Notes
Jeanella Flattery, MD  [address]  [address]  [place] [address]    Chief Complaint   Patient presents with   ? Follow-up     labs       Patient Name: Jeffery Gonzales    Date: 01/31/2017    Date Of Birth: Feb 14, 1941    History of Present Illness:  Jeffery Gonzales is a 36?year old Caucasian male who was referred to the hematology office for an evaluation of anemia. The patient has undergone an evaluation by Dr. Carolina Cellar at [facility] due to iron deficiency anemia. The patient has a history of a small bowel GI bleed in 2013 and a second episode in February 2015 most recently occurring in the setting of Xarelto use. The patient has required several transfusions in the past. The patient denies any recent evidence of bleeding specifically no bright red blood per rectum or black stools. He underwent a colonoscopy exam 10/17/2013 which revealed a 3 mm sessile polyp in the descending colon which was removed, a 3 mm sessile polyp in the descending colon which was removed, a diminutive sessile polyp in the sigmoid colon which was removed, severe diverticulosis and otherwise a normal exam. An upper endoscopy obtained 10/17/2013 revealed patchy erythema in the antrum of the stomach but otherwise a normal exam. A capsule endoscopy obtained 10/18/2013 revealed a small erythematous spot in the proximal-mi jejunum without stigmata for bleeding. The remainder of the small bowel exam was normal. As there were no overt bleeding sites identified on the GI evaluation the patient was referred to the hematology office for additional assessment of the anemia and to evaluate for a possible myelodysplasia.  ??  The patient has a significant history of coronary artery disease, diabetes, congestive heart failure, hypertension, hyperlipidemia. He states that he fell and required hip surgery in March 2015.  ??  The patient received a dose of Injectafer 06/18/2014 but did not return the

## 2017-01-31 NOTE — Progress Notes
?   Total Bilirubin 01/17/2017 0.7    ? Alkaline Phosphatase 01/17/2017 164*   ? AST 01/17/2017 34*   ? ALT 01/17/2017 25    ? Anion Gap 01/17/2017 13    ? EGFR 01/17/2017 >59    ? Ferritin 01/17/2017 133.8    ? Iron 01/17/2017 29*   ? Transferrin 01/17/2017 192*   ? TIBC 01/17/2017 244*   ? Iron Saturation 01/17/2017 12*   ? WBC 01/17/2017 4.98    ? RBC 01/17/2017 3.18    ? Hemoglobin 01/17/2017 9.7*   ? Hematocrit 01/17/2017 29.5*   ? MCV 01/17/2017 92.8    ? [facility] 01/17/2017 30.5    ? MCHC 01/17/2017 32.9    ? RDW 01/17/2017 14.9    ? Platelet Count 01/17/2017 106*   ? MPV 01/17/2017 8.8    ? Neutrophils Absolute 01/17/2017 3.73    ? Neutrophils % 01/17/2017 74.9    ? Lymphocytes Absolute 01/17/2017 0.35    ? Lymphocytes % 01/17/2017 7.0*   ? Monocytes Absolute 01/17/2017 0.69    ? Monocytes % 01/17/2017 13.9    ? Eosinophils Absolute 01/17/2017 0.20    ? Eosinophils % 01/17/2017 4.0    ? Basophil Absolute 01/17/2017 0.01    ? BASOPHILS 01/17/2017 0.2    Hospital Outpatient Visit on 01/03/2017   Component Date Value   ? WBC 01/03/2017 4.16*   ? RBC 01/03/2017 3.72    ? Hemoglobin 01/03/2017 11.1*   ? Hematocrit 01/03/2017 35.1*   ? MCV 01/03/2017 94.4    ? [facility] 01/03/2017 29.8    ? MCHC 01/03/2017 31.6    ? RDW 01/03/2017 17.7    ? Platelet Count 01/03/2017 100*   ? MPV 01/03/2017 9.2    ? Neutrophils Absolute 01/03/2017 2.94    ? Neutrophils % 01/03/2017 70.7    ? Lymphocytes Absolute 01/03/2017 0.45    ? Lymphocytes % 01/03/2017 10.8*   ? Monocytes Absolute 01/03/2017 0.51    ? Monocytes % 01/03/2017 12.3    ? Eosinophils Absolute 01/03/2017 0.25    ? Eosinophils % 01/03/2017 6.0    ? Basophil Absolute 01/03/2017 0.01    ? BASOPHILS 01/03/2017 0.2    Hospital Outpatient Visit on 12/20/2016   Component Date Value   ? WBC 12/20/2016 3.95*   ? RBC 12/20/2016 3.36    ? Hemoglobin 12/20/2016 9.9*   ? Hematocrit 12/20/2016 31.0*   ? MCV 12/20/2016 92.3    ? [facility] 12/20/2016 29.5    ? MCHC 12/20/2016 31.9

## 2017-01-31 NOTE — Progress Notes
?   RDW 12/20/2016 18.8    ? Platelet Count 12/20/2016 102*   ? MPV 12/20/2016 8.9    ? Neutrophils Absolute 12/20/2016 2.87    ? Neutrophils % 12/20/2016 72.6    ? Lymphocytes Absolute 12/20/2016 0.45    ? Lymphocytes % 12/20/2016 11.4*   ? Monocytes Absolute 12/20/2016 0.39    ? Monocytes % 12/20/2016 9.9    ? Eosinophils Absolute 12/20/2016 0.23    ? Eosinophils % 12/20/2016 5.8    ? Basophil Absolute 12/20/2016 0.01    ? BASOPHILS 12/20/2016 0.3    Hospital Outpatient Visit on 12/03/2016   Component Date Value   ? WBC 12/03/2016 4.43*   ? RBC 12/03/2016 3.40    ? Hemoglobin 12/03/2016 9.6*   ? Hematocrit 12/03/2016 30.7*   ? MCV 12/03/2016 90.3    ? [facility] 12/03/2016 28.2    ? MCHC 12/03/2016 31.3    ? RDW 12/03/2016 18.6    ? Platelet Count 12/03/2016 81*   ? MPV 12/03/2016 8.8    ? Neutrophils Absolute 12/03/2016 3.03    ? Neutrophils % 12/03/2016 68.5    ? Lymphocytes Absolute 12/03/2016 0.52    ? Lymphocytes % 12/03/2016 11.7*   ? Monocytes Absolute 12/03/2016 0.60    ? Monocytes % 12/03/2016 13.5    ? Eosinophils Absolute 12/03/2016 0.27    ? Eosinophils % 12/03/2016 6.1    ? Basophil Absolute 12/03/2016 0.01    ? BASOPHILS 12/03/2016 0.2          Imaging Results:     Health Maintenance: Colonoscopy:2015    Physical Exam:   General: Well developed, well nourished, No acute Distress, Gentleman patient and Elderly appearance  Head:Atraumatic and normocephalic  Eyes:PERRLA, EOMS intact, No jaundice, pallor and Conjunctiva clear  Ears, Nose, Throat, Neck, and Mouth: Trachea Midline, No JVD, No Lymphadenopathy, Thyroid midline-normal, Neck supple and No oral lesions  Cardiovascular:S1,S2 no murmurs and Regular heart beat  Chest:Symmetrical, No kyphosis and No scoliosis  Respiratory:Clear, No rales/Rhonchi  Gastrointestinal:Abdomen soft, Abdomen non-tender, Abdomen non-distended, Abdomen without masses, No ascites and No hepatosplenomegaly  Hematologic/Lymphatic:No petechiae, No purpura, No neck lymphadenopathy,

## 2017-01-31 NOTE — Progress Notes
following week to receive cycle 2. The patient states that he had been suffering from persistent pain in the hip and is being evaluated by orthopedics and pain management. The patient received dose 2 of Injectafer in February 2016, July 2016, Septmeber 2016 and February 2017.   ??  The patient underwent a bone marrow biopsy and aspirate 04/28/15 to evaluate for possible MDS. This revealed a hyperplastic marrow (65% cellularity) with mild myeloid hyperplasia and erythroid hyperplasia. Flow cytometry showed no evidence of acute leukemia, B-cell lymphproliferative process or T-cell aberrancy. Cytogenetics: 87 XY karyotype. FISH analysis: Normal results seen for 3 every week, 5, 7, 8, 11(MLL), 12(ETV6), 17, 19, 20. This was interpreted as a normal result. In the absence of a cytogenetic/fish abnormality, the changes are likely reactive. Clinical correlation and followup are recommended.  ??  The patient received a transfusion in November 2016 and has received several doses of intravenous iron. The patient states that he has elected not to return to the GI physicians as he is unwilling to undergo any additional GI evaluations.  ?  The patient returns for the scheduled appointment and next dose of Procrit therapy. The patient denies any new medical issues and voices no new complaints. The patient denies symptoms of shortness of breath, chest pain, bleeding including bright red blood per rectum or black stools.  Past Surgical History:   Past Surgical History:   Procedure Laterality Date   ? APPENDECTOMY     ? CORONARY ANGIOPLASTY WITH STENT PLACEMENT      x 2       Social History:    Social History     Social History   ? Marital status: Divorced     Spouse name: N/A   ? Number of children: N/A   ? Years of education: N/A     Occupational History   ? Not on file.     Social History Main Topics   ? Smoking status: Never Smoker   ? Smokeless tobacco: Never Used   ? Alcohol use 216.0 oz/week     18 Cans of beer per week

## 2017-01-31 NOTE — Progress Notes
?    aspirin 325 MG PO Tablet Delayed Release, Take 325 mg by mouth daily., Disp: , Rfl:   ?  atorvastatin (LIPITOR) 40 MG PO Tablet, Take 40 mg by mouth daily., Disp: , Rfl:   ?  diclofenac (VOLTAREN) 1 % TD Gel, Apply 4 g topically as needed for pain., Disp: , Rfl:   ?  docusate calcium (SURFAK) 240 MG PO Capsule, Take 240 mg by mouth 2 times daily as needed for constipation., Disp: , Rfl:   ?  furosemide (LASIX) 40 MG PO Tablet, Take 40 mg by mouth as specified in the order. Take 2 tablets by mouth every morning and 1 tablet by mouth every evening., Disp: , Rfl:   ?  lisinopril (PRINIVIL,ZESTRIL) 10 MG PO Tablet, Take 10 mg by mouth daily., Disp: , Rfl:   ?  tamsulosin (FLOMAX) 0.4 MG PO Capsule, Take 0.4 mg by mouth daily., Disp: , Rfl:       Labs:   Office Visit on 01/31/2017   Component Date Value   ? WBC 01/31/2017 5.02    ? RBC 01/31/2017 3.08    ? Hemoglobin 01/31/2017 9.3*   ? Hematocrit 01/31/2017 29.2*   ? MCV 01/31/2017 94.8    ? [facility] 01/31/2017 30.2    ? MCHC 01/31/2017 31.8    ? RDW 01/31/2017 14.5    ? Platelet Count 01/31/2017 124*   ? MPV 01/31/2017 9.4    ? Neutrophils Absolute 01/31/2017 3.73    ? Neutrophils % 01/31/2017 74.2    ? Lymphocytes Absolute 01/31/2017 0.42    ? Lymphocytes % 01/31/2017 8.4*   ? Monocytes Absolute 01/31/2017 0.62    ? Monocytes % 01/31/2017 12.4    ? Eosinophils Absolute 01/31/2017 0.24    ? Eosinophils % 01/31/2017 4.8    ? Basophil Absolute 01/31/2017 0.01    ? BASOPHILS 01/31/2017 0.2    Hospital Outpatient Visit on 01/17/2017   Component Date Value   ? Sodium 01/17/2017 132*   ? Potassium 01/17/2017 4.5    ? Chloride 01/17/2017 97*   ? CO2 01/17/2017 22    ? Urea Nitrogen 01/17/2017 33*   ? Creatinine 01/17/2017 1.00    ? BUN/Creatinine Ratio 01/17/2017 33.0*   ? Glucose 01/17/2017 104*   ? Calcium 01/17/2017 8.4*   ? Total Protein 01/17/2017 7.3    ? Albumin 01/17/2017 4.0    ? Calc Total Globuin 01/17/2017 3.3    ? ALBUMIN/GLOBULIN RATIO 01/17/2017 1.2

## 2017-01-31 NOTE — Patient Instructions
Hold procrit today  IV injectafer on 02/03/2017  RTC on 03/10/2017 for exam  Labs 3 days PTV

## 2017-01-31 NOTE — Progress Notes
?   Drug use: No   ? Sexual activity: No     Other Topics Concern   ? Not on file     Social History Narrative   ? No narrative on file       Family History:    Family History   Problem Relation Age of Onset   ? Diabetes Mother    ? Cancer Father        Vital Signs:   Vitals:    01/31/17 1149   BP: 144/72   Resp: 16   Temp: 36.6 ?C (97.8 ?F)   Weight: 101.8 kg (224 lb 8 oz)   Height: 1.778 m (5\' 10" )       Allergies: No known drug allergy    Review of Systems:  Constitutional:?No weight loss  No fatigue  No loss of appetite  No night sweats  No fever  No chills?  Weight loss due to diuresis  Eyes:?No blurred vision  No double vision  No difficulty seeing  Ears,Nose, Mouth,Throat: No hearing loss  No ringing in ears  No sinus trouble  No difficulty swallowing  No sore throat  No nasal drainage  No nose bleeds  No hoarseness  Cardiac:No heart papitations  No lightheadedness  No swelling in legs  Respiratory:No cough  No sputum production  No hemoptysis  No shortness of breath  No orthopnea  Gastrointestinal:No nausea  No vomiting  No heartburn  No constipation  No diarrhea  No abdominal pain  No rectal bleeding  No Bowel incontinence  Genitourinary:No burning on urination  No pain with urination  No blood in urine  No frequent urination  No urinary incontinence  Musculosketal:No muscle pain  No stiffness  No joint pain  No joint swelling  No back pain  Skin:No skin rash  Neurological:No seizures  No dizziness  No loss of balance  No weakness of limbs  No loss of sensation  Tingling sensations bilateral hands  No memory loss  No difficulty talking  Psychiatric:No nervousness  No depression  No restlessness  No difficulty sleeping  Hematologic/Lymphatic/Immunologic:No bleeding  No lumps in arm pits  No lumps in neck  No lumps in   ?  The past/family/social history and review of systems remains accurate as of today's date 01/31/2017.      Medications:   Current Outpatient Prescriptions:

## 2017-01-31 NOTE — Progress Notes
No Axillary lymphadenopathy and No Groin lymphadenopathy  Musculoskeletal: ambulates without assistance, normal tone  Extremities: No edema, No cyanosis, No digital clubbing and No discoloration  Neurologic:Alert and oriented, No Hemiparesis and No motor deficits    Assessment:  Jeffery Gonzales is a 63?year old male who was referred to the hematology office for an evaluation of anemia. 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 The patient completed a GI evaluation which did not reveal a specific source of bleeding. However, I suspect that the patient likely has had chronic GI blood loss resulting in the anemia. The patient has a history of prior GI blood loss which was exacerbated by Xarelto. The patient was on anticoagulation due to the congestive heart failure and atrial fibrillation however this has since been discontinued. The patient remains?on aspirin therapy alone.  ??  The patient denies any overt bleeding issues. There were no findings of B12 deficiency, folate deficiency, or hemolysis. The bone marrow biopsy did not show evidence of leukemia, lymphoma, myelodysplasia, or myelofibrosis. The changes noted in the bone marrow were felt consistent with a reactive process. The anemia is most consistent with anemia of chronic kidney disease. ?    I have reviewed the laboratory results with the patient. We are unable to proceed with ESA therapy due to the iron saturation of less than 20. We have discussed proceeding with parenteral iron infusions using Injectafer 750 mg IV weekly x 2 doses. We have reviewed the rare but possible risk of a severe allergic reaction with each dose of intravenous iron. After discussion with the patient he was in agreement with proceeding with the parenteral iron infusions.  ?    Recommendation/Plan:   1. We will hold Procrit for now.  2.?The patient will proceed with IV Injectafer 750 mg weekly ?2 doses.

## 2017-01-31 NOTE — Progress Notes
3. The patient will return for an exam and possible Procrit injection in 5 weeks obtaining a CBC, BMP, iron profile shortly prior to the return visit.  4. As always, Jeffery Gonzales understands that he may return to the office sooner than the scheduled appointment if any questions or concerns arise during the interim.    Return to Clinic: Return in about 5 weeks (around 03/10/2017).      This document was created using voice recognition software. Inadvertent typographical errors, word omissions and/or word substitutions may exist.

## 2017-02-03 ENCOUNTER — Inpatient Hospital Stay: Admit: 2017-02-03 | Discharge: 2017-02-04 | Primary: Internal Medicine

## 2017-02-03 DIAGNOSIS — E611 Iron deficiency: Principal | ICD-10-CM

## 2017-02-03 MED ORDER — SODIUM CHLORIDE 0.9 % IV SOLN
Freq: Once | INTRAVENOUS | Status: CP
Start: 2017-02-03 — End: ?

## 2017-02-03 MED ORDER — EPINEPHRINE 0.3 MG/0.3ML IJ SOAJ
0.3 mg | INTRAMUSCULAR | Status: CN | PRN
Start: 2017-02-03 — End: ?

## 2017-02-03 MED ORDER — SODIUM CHLORIDE 0.9 % IV SOLN
Freq: Once | INTRAVENOUS | Status: CN
Start: 2017-02-03 — End: ?

## 2017-02-03 MED ORDER — SODIUM CHLORIDE FLUSH 0.9 % IV SOLN
10 mL | Status: DC | PRN
Start: 2017-02-03 — End: 2017-02-04

## 2017-02-03 MED ORDER — SODIUM CHLORIDE FLUSH 0.9 % IV SOLN
10 mL | Status: CN | PRN
Start: 2017-02-03 — End: ?

## 2017-02-03 MED ORDER — HYDROCORTISONE NA SUCCINATE PF 100 MG IJ SOLR
100 mg | INTRAVENOUS | Status: CN | PRN
Start: 2017-02-03 — End: ?

## 2017-02-03 MED ORDER — MEPERIDINE HCL 25 MG/ML IJ SOLN
25 mg | INTRAVENOUS | Status: CN | PRN
Start: 2017-02-03 — End: ?

## 2017-02-03 MED ORDER — FERRIC CARBOXYMALTOSE IVPB
750 mg | Freq: Once | INTRAVENOUS | Status: CN
Start: 2017-02-03 — End: ?

## 2017-02-03 MED ORDER — DEXAMETHASONE SODIUM PHOSPHATE 20 MG/5ML IJ SOLN
20 mg | INTRAVENOUS | Status: CN | PRN
Start: 2017-02-03 — End: ?

## 2017-02-03 MED ORDER — DIPHENHYDRAMINE HCL 50 MG/ML IJ SOLN
25 mg | INTRAVENOUS | Status: CN | PRN
Start: 2017-02-03 — End: ?

## 2017-02-03 MED ORDER — FERRIC CARBOXYMALTOSE IVPB
750 mg | Freq: Once | INTRAVENOUS | Status: DC
Start: 2017-02-03 — End: 2017-02-04

## 2017-02-03 MED ORDER — HEPARIN SODIUM LOCK FLUSH 100 UNIT/ML IV SOLN
500 [IU] | Status: CN | PRN
Start: 2017-02-03 — End: ?

## 2017-02-03 NOTE — Progress Notes
Injectafer completed w/o incident. Pt ambulatory to scheduling. dcd

## 2017-02-08 MED FILL — LORazepam 0.5 MG TABS: 0.5 | 30 days supply | Qty: 150 | Fill #0

## 2017-02-08 MED FILL — LIVALO 2 MG TABLET: 2 | 30 days supply | Qty: 30 | Fill #1

## 2017-02-10 ENCOUNTER — Inpatient Hospital Stay: Admit: 2017-02-10 | Discharge: 2017-02-11 | Primary: Internal Medicine

## 2017-02-10 DIAGNOSIS — F1010 Alcohol abuse, uncomplicated: Secondary | ICD-10-CM

## 2017-02-10 DIAGNOSIS — I10 Essential (primary) hypertension: Secondary | ICD-10-CM

## 2017-02-10 DIAGNOSIS — I639 Cerebral infarction, unspecified: Secondary | ICD-10-CM

## 2017-02-10 DIAGNOSIS — E119 Type 2 diabetes mellitus without complications: Secondary | ICD-10-CM

## 2017-02-10 DIAGNOSIS — I509 Heart failure, unspecified: Principal | ICD-10-CM

## 2017-02-10 DIAGNOSIS — I6350 Cerebral infarction due to unspecified occlusion or stenosis of unspecified cerebral artery: Secondary | ICD-10-CM

## 2017-02-10 DIAGNOSIS — I4891 Unspecified atrial fibrillation: Secondary | ICD-10-CM

## 2017-02-10 DIAGNOSIS — E611 Iron deficiency: Principal | ICD-10-CM

## 2017-02-10 MED ORDER — SODIUM CHLORIDE FLUSH 0.9 % IV SOLN
10 mL | Status: DC | PRN
Start: 2017-02-10 — End: 2017-02-11

## 2017-02-10 MED ORDER — SODIUM CHLORIDE 0.9 % IV SOLN
Freq: Once | INTRAVENOUS | Status: CN
Start: 2017-02-10 — End: ?

## 2017-02-10 MED ORDER — FERRIC CARBOXYMALTOSE IVPB
750 mg | Freq: Once | INTRAVENOUS | Status: DC
Start: 2017-02-10 — End: 2017-02-11

## 2017-02-10 MED ORDER — MEPERIDINE HCL 25 MG/ML IJ SOLN
25 mg | INTRAVENOUS | Status: CN | PRN
Start: 2017-02-10 — End: ?

## 2017-02-10 MED ORDER — EPINEPHRINE 0.3 MG/0.3ML IJ SOAJ
0.3 mg | INTRAMUSCULAR | Status: CN | PRN
Start: 2017-02-10 — End: ?

## 2017-02-10 MED ORDER — FERRIC CARBOXYMALTOSE IVPB
750 mg | Freq: Once | INTRAVENOUS | Status: CN
Start: 2017-02-10 — End: ?

## 2017-02-10 MED ORDER — DEXAMETHASONE SODIUM PHOSPHATE 20 MG/5ML IJ SOLN
20 mg | INTRAVENOUS | Status: CN | PRN
Start: 2017-02-10 — End: ?

## 2017-02-10 MED ORDER — HYDROCORTISONE NA SUCCINATE PF 100 MG IJ SOLR
100 mg | INTRAVENOUS | Status: CN | PRN
Start: 2017-02-10 — End: ?

## 2017-02-10 MED ORDER — HEPARIN SODIUM LOCK FLUSH 100 UNIT/ML IV SOLN
500 [IU] | Status: CN | PRN
Start: 2017-02-10 — End: ?

## 2017-02-10 MED ORDER — DIPHENHYDRAMINE HCL 50 MG/ML IJ SOLN
25 mg | INTRAVENOUS | Status: CN | PRN
Start: 2017-02-10 — End: ?

## 2017-02-10 MED ORDER — SODIUM CHLORIDE FLUSH 0.9 % IV SOLN
10 mL | Status: CN | PRN
Start: 2017-02-10 — End: ?

## 2017-02-10 NOTE — Progress Notes
Patient tolerated treatment well and voiced no complaints.  Patient went to checkout to schedule future appointments.  Patient was ambulatory when he left the treatment area

## 2017-02-10 NOTE — Progress Notes
Patient was educated on his Injectafer.  Patient verbalized understanding and all questions were answered.  Patient knows he can ask questions at anytime should he have any.

## 2017-02-22 MED FILL — ZOLPIDEM TARTRATE 10 MG TAB: 10 | 30 days supply | Qty: 30 | Fill #2

## 2017-02-22 MED FILL — carBAMazepine ER 100 MG TB1: 100 | 30 days supply | Qty: 60 | Fill #0

## 2017-02-23 ENCOUNTER — Ambulatory Visit (INDEPENDENT_AMBULATORY_CARE_PROVIDER_SITE_OTHER): Payer: PPO

## 2017-02-23 DIAGNOSIS — Z23 Encounter for immunization: Secondary | ICD-10-CM

## 2017-02-23 NOTE — Progress Notes (Signed)
Pre visit review using our clinic tool,if applicable. No additional management support is needed unless otherwise documented below in the visit note.   Patient in for Influenza vaccine.  Given 0.79ml IM Fluvarix vaccination Right arm. Patient tolerated well.

## 2017-03-07 ENCOUNTER — Inpatient Hospital Stay: Admit: 2017-03-07 | Discharge: 2017-03-08 | Primary: Internal Medicine

## 2017-03-07 DIAGNOSIS — N184 Chronic kidney disease, stage 4 (severe): Secondary | ICD-10-CM

## 2017-03-07 DIAGNOSIS — D696 Thrombocytopenia, unspecified: Principal | ICD-10-CM

## 2017-03-07 DIAGNOSIS — E611 Iron deficiency: Secondary | ICD-10-CM

## 2017-03-07 DIAGNOSIS — D631 Anemia in chronic kidney disease: Secondary | ICD-10-CM

## 2017-03-07 MED FILL — LIVALO 2 MG TABLET: 2 | 30 days supply | Qty: 30 | Fill #2

## 2017-03-10 ENCOUNTER — Inpatient Hospital Stay: Admit: 2017-03-10 | Discharge: 2017-03-11 | Primary: Internal Medicine

## 2017-03-10 ENCOUNTER — Ambulatory Visit: Admit: 2017-03-10 | Discharge: 2017-03-11 | Attending: Hematology & Oncology | Primary: Internal Medicine

## 2017-03-10 DIAGNOSIS — F1010 Alcohol abuse, uncomplicated: Secondary | ICD-10-CM

## 2017-03-10 DIAGNOSIS — I639 Cerebral infarction, unspecified: Secondary | ICD-10-CM

## 2017-03-10 DIAGNOSIS — E1122 Type 2 diabetes mellitus with diabetic chronic kidney disease: Secondary | ICD-10-CM

## 2017-03-10 DIAGNOSIS — N184 Chronic kidney disease, stage 4 (severe): Principal | ICD-10-CM

## 2017-03-10 DIAGNOSIS — E119 Type 2 diabetes mellitus without complications: Secondary | ICD-10-CM

## 2017-03-10 DIAGNOSIS — K5730 Diverticulosis of large intestine without perforation or abscess without bleeding: Secondary | ICD-10-CM

## 2017-03-10 DIAGNOSIS — N189 Chronic kidney disease, unspecified: Secondary | ICD-10-CM

## 2017-03-10 DIAGNOSIS — Z79899 Other long term (current) drug therapy: Secondary | ICD-10-CM

## 2017-03-10 DIAGNOSIS — Z86010 Personal history of colonic polyps: Secondary | ICD-10-CM

## 2017-03-10 DIAGNOSIS — Z955 Presence of coronary angioplasty implant and graft: Secondary | ICD-10-CM

## 2017-03-10 DIAGNOSIS — I4891 Unspecified atrial fibrillation: Secondary | ICD-10-CM

## 2017-03-10 DIAGNOSIS — E785 Hyperlipidemia, unspecified: Secondary | ICD-10-CM

## 2017-03-10 DIAGNOSIS — I10 Essential (primary) hypertension: Secondary | ICD-10-CM

## 2017-03-10 DIAGNOSIS — I509 Heart failure, unspecified: Secondary | ICD-10-CM

## 2017-03-10 DIAGNOSIS — I130 Hypertensive heart and chronic kidney disease with heart failure and stage 1 through stage 4 chronic kidney disease, or unspecified chronic kidney disease: Principal | ICD-10-CM

## 2017-03-10 DIAGNOSIS — D631 Anemia in chronic kidney disease: Secondary | ICD-10-CM

## 2017-03-10 DIAGNOSIS — D696 Thrombocytopenia, unspecified: Secondary | ICD-10-CM

## 2017-03-10 DIAGNOSIS — I2510 Atherosclerotic heart disease of native coronary artery without angina pectoris: Secondary | ICD-10-CM

## 2017-03-10 DIAGNOSIS — I6350 Cerebral infarction due to unspecified occlusion or stenosis of unspecified cerebral artery: Secondary | ICD-10-CM

## 2017-03-10 MED ORDER — EPOETIN ALFA 20000 UNIT/ML IJ SOLN
20000 [IU] | Freq: Once | SUBCUTANEOUS | Status: CP
Start: 2017-03-10 — End: ?

## 2017-03-10 MED ORDER — EPOETIN ALFA 20000 UNIT/ML IJ SOLN
20000 [IU] | Freq: Once | SUBCUTANEOUS | Status: CN
Start: 2017-03-10 — End: ?

## 2017-03-10 NOTE — Progress Notes
?   Basophil Absolute 01/17/2017 0.01    ? BASOPHILS 01/17/2017 0.2          Imaging Results:     Health Maintenance: Colonoscopy:2015    Physical Exam:   General: Well developed, well nourished, No acute Distress, Gentleman patient and Elderly appearance  Head:Atraumatic and normocephalic  Eyes:PERRLA, EOMS intact, No jaundice, pallor and Conjunctiva clear  Ears, Nose, Throat, Neck, and Mouth: Trachea Midline, No JVD, No Lymphadenopathy, Thyroid midline-normal, Neck supple and No oral lesions  Cardiovascular:S1,S2 no murmurs and Regular heart beat  Chest:Symmetrical, No kyphosis and No scoliosis  Respiratory:Clear, No rales/Rhonchi  Gastrointestinal:Abdomen soft, Abdomen non-tender, Abdomen non-distended, Abdomen without masses, No ascites and No hepatosplenomegaly  Hematologic/Lymphatic:No petechiae, No purpura, No neck lymphadenopathy, No Axillary lymphadenopathy and No Groin lymphadenopathy  Musculoskeletal: ambulates without assistance, normal tone  Extremities: No edema, No cyanosis, No digital clubbing and No discoloration  Neurologic:Alert and oriented, No Hemiparesis and No motor deficits    Assessment:  Jeffery Gonzales is a 37?year old male who was referred to the hematology office for an evaluation of anemia. 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 The patient completed a GI evaluation which did not reveal a specific source of bleeding. However, I suspected that the patient likely had chronic GI blood loss resulting in the anemia. The patient has a history of prior GI blood loss which was exacerbated by Xarelto. The patient was on anticoagulation due to the congestive heart failure and atrial fibrillation however this has since been discontinued. The patient remains?on aspirin therapy alone.  ??  There were no findings of B12 deficiency, folate deficiency, or hemolysis. The bone marrow biopsy did not show evidence of leukemia, lymphoma,

## 2017-03-10 NOTE — Progress Notes
?   Hematocrit 01/31/2017 29.2*   ? MCV 01/31/2017 94.8    ? [facility] 01/31/2017 30.2    ? MCHC 01/31/2017 31.8    ? RDW 01/31/2017 14.5    ? Platelet Count 01/31/2017 124*   ? MPV 01/31/2017 9.4    ? Neutrophils Absolute 01/31/2017 3.73    ? Neutrophils % 01/31/2017 74.2    ? Lymphocytes Absolute 01/31/2017 0.42    ? Lymphocytes % 01/31/2017 8.4*   ? Monocytes Absolute 01/31/2017 0.62    ? Monocytes % 01/31/2017 12.4    ? Eosinophils Absolute 01/31/2017 0.24    ? Eosinophils % 01/31/2017 4.8    ? Basophil Absolute 01/31/2017 0.01    ? BASOPHILS 01/31/2017 0.2    Hospital Outpatient Visit on 01/17/2017   Component Date Value   ? Sodium 01/17/2017 132*   ? Potassium 01/17/2017 4.5    ? Chloride 01/17/2017 97*   ? CO2 01/17/2017 22    ? Urea Nitrogen 01/17/2017 33*   ? Creatinine 01/17/2017 1.00    ? BUN/Creatinine Ratio 01/17/2017 33.0*   ? Glucose 01/17/2017 104*   ? Calcium 01/17/2017 8.4*   ? Total Protein 01/17/2017 7.3    ? Albumin 01/17/2017 4.0    ? Calc Total Globuin 01/17/2017 3.3    ? ALBUMIN/GLOBULIN RATIO 01/17/2017 1.2    ? Total Bilirubin 01/17/2017 0.7    ? Alkaline Phosphatase 01/17/2017 164*   ? AST 01/17/2017 34*   ? ALT 01/17/2017 25    ? Anion Gap 01/17/2017 13    ? EGFR 01/17/2017 >59    ? Ferritin 01/17/2017 133.8    ? Iron 01/17/2017 29*   ? Transferrin 01/17/2017 192*   ? TIBC 01/17/2017 244*   ? Iron Saturation 01/17/2017 12*   ? WBC 01/17/2017 4.98    ? RBC 01/17/2017 3.18    ? Hemoglobin 01/17/2017 9.7*   ? Hematocrit 01/17/2017 29.5*   ? MCV 01/17/2017 92.8    ? [facility] 01/17/2017 30.5    ? MCHC 01/17/2017 32.9    ? RDW 01/17/2017 14.9    ? Platelet Count 01/17/2017 106*   ? MPV 01/17/2017 8.8    ? Neutrophils Absolute 01/17/2017 3.73    ? Neutrophils % 01/17/2017 74.9    ? Lymphocytes Absolute 01/17/2017 0.35    ? Lymphocytes % 01/17/2017 7.0*   ? Monocytes Absolute 01/17/2017 0.69    ? Monocytes % 01/17/2017 13.9    ? Eosinophils Absolute 01/17/2017 0.20    ? Eosinophils % 01/17/2017 4.0

## 2017-03-10 NOTE — Progress Notes
myelodysplasia, or myelofibrosis. The changes noted in the bone marrow were felt consistent with a reactive process. The anemia is most consistent with anemia of chronic kidney disease. The Procrit was held due to decreased iron indices. The patient completed a course of parenteral iron using Injectafer in August 2018. I have reviewed the laboratory results with the patient. The iron indices are now within the target range. We can resume ESA therapy using Procrit at this time.    Recommendation/Plan:   1. We will resume Procrit 20,000 subcu every 2 weeks beginning today with a target hemoglobin in the 10-11 g/dL range.  2. The patient will return for an exam and possible Procrit injection in 4 months obtaining a CBC, BMP, ferritin, iron profile shortly prior to the return visit.  3. As always, Jeffery Gonzales understands that he may return to the office sooner than the scheduled appointment if any questions or concerns arise during the interim.    Return to Clinic: Return in about 4 months (around 07/14/2017).      This document was created using voice recognition software. Inadvertent typographical errors, word omissions and/or word substitutions may exist.

## 2017-03-10 NOTE — Progress Notes
The past/family/social history and review of systems remains accurate as of today's date 03/10/2017.      Medications:   Current Outpatient Prescriptions:   ?  atorvastatin (LIPITOR) 40 MG PO Tablet, Take 40 mg by mouth daily., Disp: , Rfl:   ?  diclofenac (VOLTAREN) 1 % TD Gel, Apply 4 g topically as needed for pain., Disp: , Rfl:   ?  docusate calcium (SURFAK) 240 MG PO Capsule, Take 240 mg by mouth 2 times daily as needed for constipation., Disp: , Rfl:   ?  lisinopril (PRINIVIL,ZESTRIL) 10 MG PO Tablet, Take 10 mg by mouth daily., Disp: , Rfl:   ?  tamsulosin (FLOMAX) 0.4 MG PO Capsule, Take 0.4 mg by mouth daily., Disp: , Rfl:       Labs:   Hospital Outpatient Visit on 03/07/2017   Component Date Value   ? Sodium 03/07/2017 135    ? Potassium 03/07/2017 4.1    ? Chloride 03/07/2017 98*   ? CO2 03/07/2017 25    ? Urea Nitrogen 03/07/2017 21    ? Creatinine 03/07/2017 1.06    ? BUN/Creatinine Ratio 03/07/2017 19.8    ? Glucose 03/07/2017 145*   ? Calcium 03/07/2017 8.4*   ? Anion Gap 03/07/2017 12    ? EGFR 03/07/2017 >59    ? Iron 03/07/2017 57    ? Transferrin 03/07/2017 158*   ? TIBC 03/07/2017 201*   ? Iron Saturation 03/07/2017 28    ? WBC 03/07/2017 3.89*   ? RBC 03/07/2017 3.16    ? Hemoglobin 03/07/2017 9.7*   ? Hematocrit 03/07/2017 31.1*   ? MCV 03/07/2017 98.4    ? [facility] 03/07/2017 30.7    ? MCHC 03/07/2017 31.2    ? RDW 03/07/2017 13.7    ? Platelet Count 03/07/2017 97*   ? MPV 03/07/2017 9.4    ? Neutrophils Absolute 03/07/2017 2.74    ? Neutrophils % 03/07/2017 70.4    ? Lymphocytes Absolute 03/07/2017 0.41    ? Lymphocytes % 03/07/2017 10.5*   ? Monocytes Absolute 03/07/2017 0.44    ? Monocytes % 03/07/2017 11.3    ? Eosinophils Absolute 03/07/2017 0.29    ? Eosinophils % 03/07/2017 7.5    ? Basophil Absolute 03/07/2017 0.01    ? BASOPHILS 03/07/2017 0.3    Office Visit on 01/31/2017   Component Date Value   ? WBC 01/31/2017 5.02    ? RBC 01/31/2017 3.08    ? Hemoglobin 01/31/2017 9.3*

## 2017-03-10 NOTE — Progress Notes
following week to receive cycle 2. The patient states that he had been suffering from persistent pain in the hip and is being evaluated by orthopedics and pain management. The patient received dose 2 of Injectafer in February 2016, July 2016, Septmeber 2016 and February 2017.   ??  The patient underwent a bone marrow biopsy and aspirate 04/28/15 to evaluate for possible MDS. This revealed a hyperplastic marrow (65% cellularity) with mild myeloid hyperplasia and erythroid hyperplasia. Flow cytometry showed no evidence of acute leukemia, B-cell lymphproliferative process or T-cell aberrancy. Cytogenetics: 3 XY karyotype. FISH analysis: Normal results seen for 3 every week, 5, 7, 8, 11(MLL), 12(ETV6), 17, 19, 20. This was interpreted as a normal result. In the absence of a cytogenetic/fish abnormality, the changes are likely reactive. Clinical correlation and followup are recommended.  ??  The patient received a transfusion in November 2016 and has received several doses of intravenous iron. The patient states that he has elected not to return to the GI physicians as he is unwilling to undergo any additional GI evaluations.  ?  The Procrit was held due to a reduction in the iron indices. The patient completed a course of parenteral iron infusions using Injectafer in August 2018 and returns today for reassessment and evaluation. The patient denies any new medical issues and voices no new complaints. The patient denies symptoms of shortness of breath, chest pain, abdominal pain, bleeding, including prior blood per rectum or black stools.  Past Surgical History:   Past Surgical History:   Procedure Laterality Date   ? APPENDECTOMY     ? CORONARY ANGIOPLASTY WITH STENT PLACEMENT      x 2       Social History:    Social History     Social History   ? Marital status: Divorced     Spouse name: N/A   ? Number of children: N/A   ? Years of education: N/A     Occupational History   ? Not on file.

## 2017-03-10 NOTE — Progress Notes
Social History Main Topics   ? Smoking status: Never Smoker   ? Smokeless tobacco: Never Used   ? Alcohol use 216.0 oz/week     18 Cans of beer per week   ? Drug use: No   ? Sexual activity: No     Other Topics Concern   ? Not on file     Social History Narrative   ? No narrative on file       Family History:    Family History   Problem Relation Age of Onset   ? Diabetes Mother    ? Cancer Father        Vital Signs:   Vitals:    03/10/17 0949   BP: 139/68   Pulse: 84   Resp: 16   Temp: 36.6 ?C (97.9 ?F)   Weight: 100.9 kg (222 lb 6.4 oz)   Height: 1.778 m (5\' 10" )       Allergies: No known drug allergy    Review of Systems:  Constitutional:?No weight loss  No fatigue  No loss of appetite  No night sweats  No fever  No chills?  Weight loss due to diuresis  Eyes:?No blurred vision  No double vision  No difficulty seeing  Ears,Nose, Mouth,Throat: No hearing loss  No ringing in ears  No sinus trouble  No difficulty swallowing  No sore throat  No nasal drainage  No nose bleeds  No hoarseness  Cardiac:No heart papitations  No lightheadedness  No swelling in legs  Respiratory:No cough  No sputum production  No hemoptysis  No shortness of breath  No orthopnea  Gastrointestinal:No nausea  No vomiting  No heartburn  No constipation  No diarrhea  No abdominal pain  No rectal bleeding  No Bowel incontinence  Genitourinary:No burning on urination  No pain with urination  No blood in urine  No frequent urination  No urinary incontinence  Musculosketal:No muscle pain  No stiffness  No joint pain  No joint swelling  No back pain  Skin:No skin rash  Neurological:No seizures  No dizziness  No loss of balance  No weakness of limbs  No loss of sensation  Tingling sensations bilateral hands  No memory loss  No difficulty talking  Psychiatric:No nervousness  No depression  No restlessness  No difficulty sleeping  Hematologic/Lymphatic/Immunologic:No bleeding  No lumps in arm pits  No lumps in neck  No lumps in   ?

## 2017-03-10 NOTE — Patient Instructions
Procrit today and every 2 weeks  RTC 4 months  Labs 2 weeks PTV(to be done on the same day as procrit injection)-CBC, BMP, Ferritin, Iron profile

## 2017-03-10 NOTE — Progress Notes
Jeanella Flattery, MD  [address]  [address]  [place] [address]    Chief Complaint   Patient presents with   ? Follow-up     labs       Patient Name: Jeffery Gonzales    Date: 03/10/2017    Date Of Birth: Feb 10, 1941    History of Present Illness:  Jeffery Gonzales is a 52?year old Caucasian male who was referred to the hematology office for an evaluation of anemia. The patient has undergone an evaluation by Dr. Carolina Cellar at [facility] due to iron deficiency anemia. The patient has a history of a small bowel GI bleed in 2013 and a second episode in February 2015 most recently occurring in the setting of Xarelto use. The patient has required several transfusions in the past. The patient denies any recent evidence of bleeding specifically no bright red blood per rectum or black stools. He underwent a colonoscopy exam 10/17/2013 which revealed a 3 mm sessile polyp in the descending colon which was removed, a 3 mm sessile polyp in the descending colon which was removed, a diminutive sessile polyp in the sigmoid colon which was removed, severe diverticulosis and otherwise a normal exam. An upper endoscopy obtained 10/17/2013 revealed patchy erythema in the antrum of the stomach but otherwise a normal exam. A capsule endoscopy obtained 10/18/2013 revealed a small erythematous spot in the proximal-mi jejunum without stigmata for bleeding. The remainder of the small bowel exam was normal. As there were no overt bleeding sites identified on the GI evaluation the patient was referred to the hematology office for additional assessment of the anemia and to evaluate for a possible myelodysplasia.  ??  The patient has a significant history of coronary artery disease, diabetes, congestive heart failure, hypertension, hyperlipidemia. He states that he fell and required hip surgery in March 2015.  ??  The patient received a dose of Injectafer 06/18/2014 but did not return the

## 2017-03-13 ENCOUNTER — Encounter: Payer: Self-pay | Admitting: Family Medicine

## 2017-03-14 ENCOUNTER — Other Ambulatory Visit: Payer: Self-pay | Admitting: Family Medicine

## 2017-03-14 MED FILL — FENOFIBRATE 160 MG TABLET: 160 | 90 days supply | Qty: 90 | Fill #0

## 2017-03-14 MED FILL — LORazepam 0.5 MG TABS: 0.5 | 30 days supply | Qty: 150 | Fill #1

## 2017-03-18 ENCOUNTER — Ambulatory Visit (INDEPENDENT_AMBULATORY_CARE_PROVIDER_SITE_OTHER): Payer: PPO | Admitting: Medical

## 2017-03-18 ENCOUNTER — Encounter: Payer: Self-pay | Admitting: Medical

## 2017-03-18 VITALS — BP 130/66 | HR 69 | Temp 98.1°F | Resp 16 | Ht 70.0 in | Wt 234.0 lb

## 2017-03-18 DIAGNOSIS — M79675 Pain in left toe(s): Secondary | ICD-10-CM

## 2017-03-18 DIAGNOSIS — L089 Local infection of the skin and subcutaneous tissue, unspecified: Secondary | ICD-10-CM

## 2017-03-18 MED ORDER — AMOXICILLIN-POT CLAVULANATE 875-125 MG PO TABS: 1.0000 | ORAL_TABLET | Freq: Two times a day (BID) | ORAL | 0 refills | Status: DC

## 2017-03-18 MED FILL — AMOX-CLAV 875-125 MG TABLET: 875-125 | 10 days supply | Qty: 20 | Fill #0

## 2017-03-18 NOTE — Progress Notes (Signed)
Subjective:    Patient ID: William [name], male    DOB: 27-Apr-1941, 76 y.o.   MRN: [id number]  HPI  Patients and with one week of left great toe region pain. Pain more at the tip. He does note that he has some faint wheezing at the base of the nail. He does not report any trauma or injury to the toe. He does work out 3 times a week and walks short distances. No discharge from the nail edge. Patient on review of chart is not diabetic.  Review of Systems  Constitutional: Negative for chills, fatigue and fever.  Respiratory: Negative for cough, choking, shortness of breath and wheezing.   Cardiovascular: Negative for chest pain and palpitations.  Musculoskeletal:       Left great toe pain  Skin: Positive for rash.       Slight redness to the tip of left great toe and adjacent to nail.  Hematological: Negative for adenopathy. Does not bruise/bleed easily.   Past Medical History:  Diagnosis Date  . ANXIETY   . Arthritis    low back  . Bladder stone   . BPH (benign prostatic hypertrophy)   . Carotid artery occlusion   . 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 CVD (cerebrovascular disease)   . Eczema   . GERD (gastroesophageal reflux disease)   . History of carotid artery stenosis    S/P BILATERAL CEA  . 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 Hyperlipidemia   . Hypertension   . Nocturia   . S/P carotid endarterectomy    BILATERAL ICA--  PATENT PER DUPLEX  05-19-2012  . Stroke [facility]) 1996   pt states no weaknes but has severe anxiety  . Urinary frequency   . Vitamin D deficiency      Social History   Social History  . Marital status: Married    Spouse name: N/A  . Number of children: 2  . Years of education: N/A   Occupational History  .  Retired   Social History Main Topics  . Smoking status: Former Smoker    Packs/day: 2.00    Years: 40.00    Types: Cigarettes    Quit date: 02/15/2005  . Smokeless tobacco: Never Used    . Alcohol use 0.0 oz/week     Comment: Occasional  . Drug use: No  . Sexual activity: Yes    Partners: Female   Other Topics Concern  . Not on file   Social History Narrative   Exercise--  Walks dogs everyday    Past Surgical History:  Procedure Laterality Date  . APPENDECTOMY  AS CHILD  . CARDIOVASCULAR STRESS TEST  03-27-2012  DR CRENSHAW   LOW RISK LEXISCAN STUDY-- PROBABLE NORMAL PERFUSION AND SOFT TISSUE ATTENUATION/  NO ISCHEMIA/ EF 51%  . CAROTID ENDARTERECTOMY Bilateral LEFT  11-12-2008  DR GREG HAYES   RIGHT ICA  2006  ([facility])  . CYSTOSCOPY WITH LITHOLAPAXY N/A 02/26/2013   Procedure: CYSTOSCOPY WITH LITHOLAPAXY;  Surgeon: Franchot Gallo, MD;  Location: [facility];  Service: Urology;  Laterality: N/A;  . EYE SURGERY  Jan. 2016   cataract surgery both eyes  . INGUINAL HERNIA REPAIR Right 11-08-2006  . MASS EXCISION N/A 03/03/2016   Procedure: EXCISION OF BACK  MASS;  Surgeon: Stark Klein, MD;  Location: [place];  Service: General;  Laterality: N/A;  . MOHS SURGERY Left 1/ 2016  Dr Nevada Crane-- Basal cell  . PROSTATE SURGERY    . TRANSURETHRAL RESECTION OF PROSTATE N/A 02/26/2013   Procedure: TRANSURETHRAL RESECTION OF THE PROSTATE WITH GYRUS INSTRUMENTS;  Surgeon: Franchot Gallo, MD;  [facility];  Service: Urology;  Laterality: N/A;    Family History  Problem Relation Age of Onset  . Heart disease Mother        CHF  . Bipolar disorder Mother   . Heart disease Father        CHF    Allergies  Allergen Reactions  . Adhesive [Tape] Other (See Comments)    BLISTER  . Strawberry (Diagnostic) Hives    Current Outpatient Prescriptions on File Prior to Visit  Medication Sig Dispense Refill  . aspirin 325 MG EC tablet Take 325 mg by mouth daily.    . B Complex Vitamins (VITAMIN B COMPLEX PO) Take by mouth daily.      . Calcium Carb-Cholecalciferol (CALCIUM 1000 + D PO) Take by mouth daily.      .  carbamazepine (TEGRETOL-XR) 100 MG 12 hr tablet 1 qam  1  qhs 60 tablet 8  . Cholecalciferol (VITAMIN D) 2000 UNITS CAPS Take by mouth.    . Coenzyme Q10 (COQ10) 200 MG CAPS Take 1 capsule by mouth daily.    . Eszopiclone 3 MG TABS TAKE 1 TABLET BY MOUTH AT BEDTIME AS NEEDED TAKE IMMEDIATELY BEFORE BEDTIME 30 tablet 1  . fenofibrate 160 MG tablet TAKE 1 TABLET BY MOUTH DAILY. REPEAT LABS ARE DUE NOW 90 tablet 0  . Flaxseed, Linseed, (FLAX SEED OIL PO) Take by mouth daily.      . fluticasone (CUTIVATE) 0.05 % cream   3  . levocetirizine (XYZAL) 5 MG tablet Take 1 tablet (5 mg total) by mouth every evening. (Patient taking differently: Take 5 mg by mouth daily as needed. ) 30 tablet 5  . lisinopril (PRINIVIL,ZESTRIL) 20 MG tablet TAKE 1 TABLET (20 MG TOTAL) BY MOUTH DAILY. 90 tablet 3  . LORazepam (ATIVAN) 0.5 MG tablet 2 bid  1  qday  prn 150 tablet 5  . Multiple Vitamin (MULTIVITAMIN) tablet Take 1 tablet by mouth daily.    [name] NEXIUM 40 MG capsule TAKE 1 CAPSULE BY MOUTH ONCE DAILY AS NEEDED 90 capsule 0  . Pitavastatin Calcium (LIVALO) 2 MG TABS Take 1 tablet (2 mg total) by mouth daily. 30 tablet 3  . tamsulosin (FLOMAX) 0.4 MG CAPS capsule 1 po qd 30 capsule    No current facility-administered medications on file prior to visit.     BP 130/66   Pulse 69   Temp 98.1 F (36.7 C) (Oral)   Resp 16   Ht 5\' 10"  (1.778 m)   Wt 234 lb (106.1 kg)   SpO2 97%   BMI 33.58 kg/m       Objective:   Physical Exam  General- No acute distress. Pleasant patient. Neck- Full range of motion, no jvd Lungs- Clear, even and unlabored. Heart- regular rate and rhythm. Neurologic- CNII- XII grossly intact. Left foot- normal except left great toe show some redness at the distal portion of toe and some faint redness at the edge of the medial aspect of nail. Some faint bruising at the base of the now but the nail is not loose.      Assessment & Plan:  Your toenail does look slightly ingrown with  faint redness and tenderness indicating probable toe region infection. You do have some faint bruising  at the base of the nail as well.  I want you to start Augmentin antibiotic today and start warm salt water soaks twice daily. The area should gradually improve in terms of pain redness and swelling. Caution on trimming toenails in the future.  If area worsens or changes please let me know.   Follow-up in 7-10 days or as needed.  The nail does not look loose presently. If the nail were to loosen significantly then could refer to podiatrist.  Mackie Pai, PA-C

## 2017-03-18 NOTE — Patient Instructions (Addendum)
Your toenail does look slightly ingrown with faint redness and tenderness indicating probable toe region infection. You do have some faint bruising at the base of the nail as well.  I want you to start Augmentin antibiotic today and start warm salt water soaks twice daily. The area should gradually improve in terms of pain redness and swelling. Caution on trimming toenails in the future.(Explained on proper technique)  If area worsens or changes please let me know.   Follow-up in 7-10 days or as needed.  The nail does not look loose presently. If the nail were to loosen significantly then could refer to podiatrist.

## 2017-03-22 DIAGNOSIS — M7062 Trochanteric bursitis, left hip: Secondary | ICD-10-CM | POA: Diagnosis not present

## 2017-03-22 DIAGNOSIS — M1612 Unilateral primary osteoarthritis, left hip: Secondary | ICD-10-CM | POA: Diagnosis not present

## 2017-03-24 ENCOUNTER — Inpatient Hospital Stay: Admit: 2017-03-24 | Discharge: 2017-03-25 | Primary: Internal Medicine

## 2017-03-24 DIAGNOSIS — D631 Anemia in chronic kidney disease: Secondary | ICD-10-CM

## 2017-03-24 DIAGNOSIS — N184 Chronic kidney disease, stage 4 (severe): Secondary | ICD-10-CM

## 2017-03-24 DIAGNOSIS — D696 Thrombocytopenia, unspecified: Principal | ICD-10-CM

## 2017-03-24 DIAGNOSIS — N186 End stage renal disease: Secondary | ICD-10-CM

## 2017-03-25 ENCOUNTER — Other Ambulatory Visit (INDEPENDENT_AMBULATORY_CARE_PROVIDER_SITE_OTHER): Payer: PPO

## 2017-03-25 DIAGNOSIS — E785 Hyperlipidemia, unspecified: Secondary | ICD-10-CM | POA: Diagnosis not present

## 2017-03-25 DIAGNOSIS — R739 Hyperglycemia, unspecified: Secondary | ICD-10-CM | POA: Diagnosis not present

## 2017-03-25 DIAGNOSIS — R7989 Other specified abnormal findings of blood chemistry: Secondary | ICD-10-CM | POA: Diagnosis not present

## 2017-03-25 LAB — COMPREHENSIVE METABOLIC PANEL
ALBUMIN: 4.1 g/dL (ref 3.5–5.2)
ALK PHOS: 51 U/L (ref 39–117)
ALT: 24 U/L (ref 0–53)
AST: 22 U/L (ref 0–37)
BILIRUBIN TOTAL: 0.5 mg/dL (ref 0.2–1.2)
BUN: 20 mg/dL (ref 6–23)
CALCIUM: 9.6 mg/dL (ref 8.4–10.5)
CO2: 27 mEq/L (ref 19–32)
Chloride: 104 mEq/L (ref 96–112)
Creatinine, Ser: 1.45 mg/dL (ref 0.40–1.50)
GFR: 50.47 mL/min — AB (ref >60.00)
GLUCOSE: 104 mg/dL — AB (ref 70–99)
Potassium: 4.1 mEq/L (ref 3.5–5.1)
Sodium: 139 mEq/L (ref 135–145)
Total Protein: 7.7 g/dL (ref 6.0–8.3)

## 2017-03-25 LAB — HEMOGLOBIN A1C: HEMOGLOBIN A1C: 5.9 % (ref 4.6–6.5)

## 2017-03-25 LAB — LIPID PANEL
CHOLESTEROL: 194 mg/dL (ref 0–200)
HDL: 50.9 mg/dL (ref >39.00)
LDL Cholesterol: 125 mg/dL — ABNORMAL HIGH (ref 0–99)
NONHDL: 143.11
Total CHOL/HDL Ratio: 4
Triglycerides: 93 mg/dL (ref 0.0–149.0)
VLDL: 18.6 mg/dL (ref 0.0–40.0)

## 2017-03-27 ENCOUNTER — Encounter: Payer: Self-pay | Admitting: Medical

## 2017-03-28 ENCOUNTER — Telehealth: Payer: Self-pay | Admitting: Medical

## 2017-03-28 DIAGNOSIS — L600 Ingrowing nail: Secondary | ICD-10-CM

## 2017-03-28 MED FILL — ZOLPIDEM TARTRATE 10 MG TAB: 10 | 30 days supply | Qty: 30 | Fill #3

## 2017-03-28 NOTE — Telephone Encounter (Signed)
Referral to podiatrist placed

## 2017-03-29 ENCOUNTER — Ambulatory Visit (INDEPENDENT_AMBULATORY_CARE_PROVIDER_SITE_OTHER): Payer: PPO | Admitting: Podiatry

## 2017-03-29 ENCOUNTER — Encounter: Payer: Self-pay | Admitting: Podiatry

## 2017-03-29 ENCOUNTER — Ambulatory Visit (INDEPENDENT_AMBULATORY_CARE_PROVIDER_SITE_OTHER): Payer: PPO | Admitting: Family Medicine

## 2017-03-29 ENCOUNTER — Encounter: Payer: Self-pay | Admitting: Family Medicine

## 2017-03-29 VITALS — BP 130/82 | HR 70 | Temp 98.0°F | Ht 71.0 in | Wt 234.0 lb

## 2017-03-29 DIAGNOSIS — E785 Hyperlipidemia, unspecified: Secondary | ICD-10-CM | POA: Diagnosis not present

## 2017-03-29 DIAGNOSIS — Z8739 Personal history of other diseases of the musculoskeletal system and connective tissue: Secondary | ICD-10-CM | POA: Diagnosis not present

## 2017-03-29 DIAGNOSIS — L601 Onycholysis: Secondary | ICD-10-CM

## 2017-03-29 DIAGNOSIS — B351 Tinea unguium: Secondary | ICD-10-CM

## 2017-03-29 DIAGNOSIS — M79676 Pain in unspecified toe(s): Secondary | ICD-10-CM | POA: Diagnosis not present

## 2017-03-29 MED ORDER — TRAMADOL HCL 50 MG PO TABS: 50.0000 mg | ORAL_TABLET | Freq: Two times a day (BID) | ORAL | 0 refills | Status: DC | PRN

## 2017-03-29 MED ORDER — EZETIMIBE 10 MG PO TABS: 10.0000 mg | ORAL_TABLET | Freq: Every day | ORAL | 3 refills | Status: DC

## 2017-03-29 MED ORDER — TRAMADOL HCL 50 MG PO TABS
50.0000 mg | ORAL_TABLET | Freq: Three times a day (TID) | ORAL | 0 refills | Status: DC | PRN
Start: 2017-03-29 — End: 2017-03-29

## 2017-03-29 MED FILL — EZETIMIBE 10 MG TABS: 10 | 90 days supply | Qty: 90 | Fill #0

## 2017-03-29 MED FILL — carBAMazepine ER 100 MG TB1: 100 | 30 days supply | Qty: 60 | Fill #1

## 2017-03-29 NOTE — Patient Instructions (Signed)
Soak Instructions    THE DAY AFTER THE PROCEDURE  Place 1/4 cup of epsom salts in a quart of warm tap water.  Submerge your foot or feet with outer bandage intact for the initial soak; this will allow the bandage to become moist and wet for easy lift off.  Once you remove your bandage, continue to soak in the solution for 20 minutes.  This soak should be done twice a day.  Next, remove your foot or feet from solution, blot dry the affected area and cover.  You may use a band aid large enough to cover the area or use gauze and tape.  Apply other medications to the area as directed by the doctor such as polysporin neosporin.  IF YOUR SKIN BECOMES IRRITATED WHILE USING THESE INSTRUCTIONS, IT IS OKAY TO SWITCH TO  WHITE VINEGAR AND WATER. Or you may use antibacterial soap and water to keep the toe clean  Monitor for any signs/symptoms of infection. Call the office immediately if any occur or go directly to the emergency room. Call with any questions/concerns.   

## 2017-03-29 NOTE — Progress Notes (Signed)
Patient ID: William William [name], male    DOB: [birth year]  Age: 76 y.o. MRN: [id number]    Subjective:  Subjective  HPI William William [name] presents for f/u lipids.  He stopped the livalo due to myalgias.  Myalgias have resolved.    Review of Systems  Constitutional: Negative for appetite change, diaphoresis, fatigue and unexpected weight change.  Eyes: Negative for pain, redness and visual disturbance.  Respiratory: Negative for cough, chest tightness, shortness of breath and wheezing.   Cardiovascular: Negative for chest pain, palpitations and leg swelling.  Endocrine: Negative for cold intolerance, heat intolerance, polydipsia, polyphagia and polyuria.  Genitourinary: Negative for difficulty urinating, dysuria and frequency.  Musculoskeletal: Positive for gait problem.  Neurological: Negative for dizziness, light-headedness, numbness and headaches.    History Past Medical History:  Diagnosis Date  . ANXIETY   . Arthritis    low back  . Bladder stone   . BPH (benign prostatic hypertrophy)   . Carotid artery occlusion   . 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 CVD (cerebrovascular disease)   . Eczema   . GERD (gastroesophageal reflux disease)   . History of carotid artery stenosis    S/P BILATERAL CEA  . 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 Hyperlipidemia   . Hypertension   . Nocturia   . S/P carotid endarterectomy    BILATERAL ICA--  PATENT PER DUPLEX  05-19-2012  . Stroke [facility]) 1996   pt states no weaknes but has severe anxiety  . Urinary frequency   . Vitamin D deficiency     He has a past surgical history that includes Carotid endarterectomy (Bilateral, LEFT  11-12-2008  DR GREG HAYES); Inguinal hernia repair (Right, 11-08-2006); Cardiovascular stress test (03-27-2012  DR CRENSHAW); Appendectomy (AS CHILD); Cystoscopy with litholapaxy (N/A, 02/26/2013); Transurethral resection of prostate (N/A, 02/26/2013); Prostate surgery; Mohs  surgery (Left, 1/ 2016); Eye surgery (Jan. 2016); and Mass excision (N/A, 03/03/2016).   His family history includes Bipolar disorder in his mother; Heart disease in his father and mother.He reports that he quit smoking about 12 years ago. His smoking use included Cigarettes. He has a 80.00 pack-year smoking history. He has never used smokeless tobacco. He reports that he drinks alcohol. He reports that he does not use drugs.  Current Outpatient Prescriptions on File Prior to Visit  Medication Sig Dispense Refill  . aspirin 325 MG EC tablet Take 325 mg by mouth daily.    . B Complex Vitamins (VITAMIN B COMPLEX PO) Take by mouth daily.      . William [name] Carb-Cholecalciferol (William [name] 1000 + D PO) Take by mouth daily.      . carbamazepine (TEGRETOL-XR) 100 MG 12 hr tablet 1 qam  1  qhs 60 tablet 8  . Cholecalciferol (VITAMIN D) 2000 UNITS CAPS Take by mouth.    . Coenzyme Q10 (COQ10) 200 MG CAPS Take 1 capsule by mouth daily.    . Eszopiclone 3 MG TABS TAKE 1 TABLET BY MOUTH AT BEDTIME AS NEEDED TAKE IMMEDIATELY BEFORE BEDTIME 30 tablet 1  . fenofibrate 160 MG tablet TAKE 1 TABLET BY MOUTH DAILY. REPEAT LABS ARE DUE NOW 90 tablet 0  . Flaxseed, Linseed, (FLAX SEED OIL PO) Take by mouth daily.      . fluticasone (CUTIVATE) 0.05 % cream   3  . levocetirizine (XYZAL) 5 MG tablet Take 1 tablet (5 mg total) by mouth every evening. (Patient taking differently: Take  5 mg by mouth daily as needed. ) 30 tablet 5  . lisinopril (PRINIVIL,ZESTRIL) 20 MG tablet TAKE 1 TABLET (20 MG TOTAL) BY MOUTH DAILY. 90 tablet 3  . LORazepam (ATIVAN) 0.5 MG tablet 2 bid  1  qday  prn 150 tablet 5  . Multiple Vitamin (MULTIVITAMIN) tablet Take 1 tablet by mouth daily.    [name] NEXIUM 40 MG capsule TAKE 1 CAPSULE BY MOUTH ONCE DAILY AS NEEDED 90 capsule 0  . tamsulosin (FLOMAX) 0.4 MG CAPS capsule 1 po qd 30 capsule    No current facility-administered medications on file prior to visit.      Objective:  Objective  Physical  Exam  Constitutional: He is oriented to person, place, and time. Vital signs are normal. He appears well-developed and well-nourished. He is sleeping.  HENT:  Head: Normocephalic and atraumatic.  Mouth/Throat: Oropharynx is clear and moist.  Eyes: Pupils are equal, round, and reactive to light. EOM are normal.  Neck: Normal range of motion. Neck supple. No thyromegaly present.  Cardiovascular: Normal rate and regular rhythm.   No murmur heard. Pulmonary/Chest: Effort normal and breath sounds normal. No respiratory distress. He has no wheezes. He has no rales. He exhibits no tenderness.  Musculoskeletal: He exhibits no edema or tenderness.  Neurological: He is alert and oriented to person, place, and time.  Skin: Skin is warm and dry.  Psychiatric: He has a normal mood and affect. His behavior is normal. Judgment and thought content normal.  Nursing note and vitals reviewed.  BP 130/82   Pulse 70   Temp 98 F (36.7 C) (Oral)   Ht 5\' 11"  (1.803 m)   Wt 234 lb (106.1 kg)   SpO2 98%   BMI 32.64 kg/m  Wt Readings from Last 3 Encounters:  03/29/17 234 lb (106.1 kg)  03/18/17 234 lb (106.1 kg)  01/24/17 234 lb 2 oz (106.2 kg)     Lab Results  Component Value Date   WBC 7.5 12/24/2016   HGB 13.0 12/24/2016   HCT 38.1 (L) 12/24/2016   PLT 219.0 12/24/2016   GLUCOSE 104 (H) 03/25/2017   CHOL 194 03/25/2017   TRIG 93.0 03/25/2017   HDL 50.90 03/25/2017   LDLCALC 125 (H) 03/25/2017   ALT 24 03/25/2017   AST 22 03/25/2017   NA 139 03/25/2017   K 4.1 03/25/2017   CL 104 03/25/2017   CREATININE 1.45 03/25/2017   BUN 20 03/25/2017   CO2 27 03/25/2017   TSH 1.668 10/16/2013   PSA 2.10 12/24/2016   INR 1.0 11/08/2008   HGBA1C 5.9 03/25/2017    No results found.   Assessment & Plan:  Plan  I have discontinued William William [name] and amoxicillin-clavulanate. I am also having him start on ezetimibe. Additionally, I am having him maintain his (Flaxseed, Linseed,  (FLAX SEED OIL PO)), B Complex Vitamins (VITAMIN B COMPLEX PO), William [name] Carb-Cholecalciferol (William [name] 1000 + D PO), aspirin, multivitamin, Vitamin D, tamsulosin, levocetirizine, Eszopiclone, NEXIUM, fluticasone, carbamazepine, LORazepam, lisinopril, CoQ10, and fenofibrate.  Meds ordered this encounter  Medications  . ezetimibe (ZETIA) 10 MG tablet    Sig: Take 1 tablet (10 mg total) by mouth daily.    Dispense:  90 tablet    Refill:  3    Problem List Items Addressed This Visit      Unprioritized   Hyperlipidemia LDL goal <100 - Primary   Relevant Medications   ezetimibe (ZETIA) 10 MG tablet    livalo stopped-- start zetia  Check labs 3 months  Follow-up: Return in about 3 months (around 06/29/2017) for hyperlipidemia.  Ann Held, DO

## 2017-03-29 NOTE — Progress Notes (Signed)
Subjective:    Patient ID: William [name], male    DOB: 1940/08/16, 76 y.o.   MRN: [id number]  HPI  76 year old male presents the office today for concerns of discoloration and pain to his left big toe. He states he is having of the sleeper wear shoes because of the pain to the toenail. He did see his primary care physician and was told he had an ingrown toenail he was given Augmentin which he finished knee did not see any significant improvement. He has been soaking in Epson salts without any improvement. Denies any drainage or pus coming from the toe. He has no other concerns this area today.  Review of Systems  All other systems reviewed and are negative.  Past Medical History:  Diagnosis Date  . ANXIETY   . Arthritis    low back  . Bladder stone   . BPH (benign prostatic hypertrophy)   . Carotid artery occlusion   . 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 CVD (cerebrovascular disease)   . Eczema   . GERD (gastroesophageal reflux disease)   . History of carotid artery stenosis    S/P BILATERAL CEA  . 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 Hyperlipidemia   . Hypertension   . Nocturia   . S/P carotid endarterectomy    BILATERAL ICA--  PATENT PER DUPLEX  05-19-2012  . Stroke [facility]) 1996   pt states no weaknes but has severe anxiety  . Urinary frequency   . Vitamin D deficiency     Past Surgical History:  Procedure Laterality Date  . APPENDECTOMY  AS CHILD  . CARDIOVASCULAR STRESS TEST  03-27-2012  DR CRENSHAW   LOW RISK LEXISCAN STUDY-- PROBABLE NORMAL PERFUSION AND SOFT TISSUE ATTENUATION/  NO ISCHEMIA/ EF 51%  . CAROTID ENDARTERECTOMY Bilateral LEFT  11-12-2008  DR GREG HAYES   RIGHT ICA  2006  ([facility])  . CYSTOSCOPY WITH LITHOLAPAXY N/A 02/26/2013   Procedure: CYSTOSCOPY WITH LITHOLAPAXY;  Surgeon: Franchot Gallo, MD;  Location: [facility];  Service: Urology;  Laterality: N/A;  . EYE SURGERY  Jan. 2016   cataract surgery both eyes  . INGUINAL HERNIA REPAIR Right 11-08-2006  . MASS EXCISION N/A 03/03/2016   Procedure: EXCISION OF BACK  MASS;  Surgeon: Stark Klein, MD;  Location: [facility];  Service: General;  Laterality: N/A;  . 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 PROSTATE SURGERY    . TRANSURETHRAL RESECTION OF PROSTATE N/A 02/26/2013   Procedure: TRANSURETHRAL RESECTION OF THE PROSTATE WITH GYRUS INSTRUMENTS;  Surgeon: Franchot Gallo, MD;  Location: [facility];  Service: Urology;  Laterality: N/A;     Current Outpatient Prescriptions:  .  aspirin 325 MG EC tablet, Take 325 mg by mouth daily., Disp: , Rfl:  .  B Complex Vitamins (VITAMIN B COMPLEX PO), Take by mouth daily.  , Disp: , Rfl:  .  Calcium Carb-Cholecalciferol (CALCIUM 1000 + D PO), Take by mouth daily.  , Disp: , Rfl:  .  carbamazepine (TEGRETOL-XR) 100 MG 12 hr tablet, 1 qam  1  qhs, Disp: 60 tablet, Rfl: 8 .  Cholecalciferol (VITAMIN D) 2000 UNITS CAPS, Take by mouth., Disp: , Rfl:  .  Coenzyme Q10 (COQ10) 200 MG CAPS, Take 1 capsule by mouth daily., Disp: , Rfl:  .  Eszopiclone 3 MG TABS, TAKE 1 TABLET BY MOUTH AT  BEDTIME AS NEEDED TAKE IMMEDIATELY BEFORE BEDTIME, Disp: 30 tablet, Rfl: 1 .  ezetimibe (ZETIA) 10 MG tablet, Take 1 tablet (10 mg total) by mouth daily., Disp: 90 tablet, Rfl: 3 .  fenofibrate 160 MG tablet, TAKE 1 TABLET BY MOUTH DAILY. REPEAT LABS ARE DUE NOW, Disp: 90 tablet, Rfl: 0 .  Flaxseed, Linseed, (FLAX SEED OIL PO), Take by mouth daily.  , Disp: , Rfl:  .  fluticasone (CUTIVATE) 0.05 % cream, , Disp: , Rfl: 3 .  levocetirizine (XYZAL) 5 MG tablet, Take 1 tablet (5 mg total) by mouth every evening. (Patient taking differently: Take 5 mg by mouth daily as needed. ), Disp: 30 tablet, Rfl: 5 .  lisinopril (PRINIVIL,ZESTRIL) 20 MG tablet, TAKE 1 TABLET (20 MG TOTAL) BY MOUTH DAILY., Disp: 90 tablet, Rfl: 3 .  LORazepam (ATIVAN) 0.5 MG tablet, 2 bid  1  qday   prn, Disp: 150 tablet, Rfl: 5 .  Multiple Vitamin (MULTIVITAMIN) tablet, Take 1 tablet by mouth daily., Disp: , Rfl:  .  NEXIUM 40 MG capsule, TAKE 1 CAPSULE BY MOUTH ONCE DAILY AS NEEDED, Disp: 90 capsule, Rfl: 0 .  tamsulosin (FLOMAX) 0.4 MG CAPS capsule, 1 po qd, Disp: 30 capsule, Rfl:  .  traMADol (ULTRAM) 50 MG tablet, Take 1 tablet (50 mg total) by mouth every 12 (twelve) hours as needed., Disp: 10 tablet, Rfl: 0  Allergies  Allergen Reactions  . Adhesive [Tape] Other (See Comments)    BLISTER  . Statins Other (See Comments)    myalgias  . Strawberry (Diagnostic) Hives    Social History   Social History  . Marital status: Married    Spouse name: N/A  . Number of children: 2  . Years of education: N/A   Occupational History  .  Retired   Social History Main Topics  . Smoking status: Former Smoker    Packs/day: 2.00    Years: 40.00    Types: Cigarettes    Quit date: 02/15/2005  . Smokeless tobacco: Never Used  . Alcohol use 0.0 oz/week     Comment: Occasional  . Drug use: No  . Sexual activity: Yes    Partners: Female   Other Topics Concern  . Not on file   Social History Narrative   Exercise--  Walks dogs everyday         Objective:   Physical Exam General: AAO x3, NAD  Dermatological: The left hallux toenail with mild incurvation on the corners but there is subungual hematoma under the entire toenail and the nail started to loosen from the underlying nail bed. There is localized edema around the nail corners. There is no significant erythema or ascending cellulitis. There is no malodor. No open lesions.   Vascular: Dorsalis Pedis artery and Posterior Tibial artery pedal pulses are 2/4 bilateral with immedate capillary fill time. There is no pain with calf compression, swelling, warmth, erythema.   Neruologic: Grossly intact via light touch bilateral. Protective threshold with Semmes Wienstein monofilament intact to all pedal sites bilateral.    Musculoskeletal: No gross boney pedal deformities bilateral. No pain, crepitus, or limitation noted with foot and ankle range of motion bilateral. Muscular strength 5/5 in all groups tested bilateral.     Assessment & Plan:  76 year old male with left hallux toenail pain with subungual hematoma  -Treatment options discussed including all alternatives, risks, and complications -Etiology of symptoms were discussed -At this time, recommended totalnail removal without chemical matricectomy to the left due to  pain and subungual hematoma. Risks and complications were discussed with the patient for which they understand and written consent was obtained. Under sterile conditions a total of 3 mL of a mixture of 2% lidocaine plain and 0.5% Marcaine plain was infiltrated in a hallux block fashion. Once anesthetized, the skin was prepped in sterile fashion. A tourniquet was then applied. Next the left hallux nail border was excised making sure to remove the entire offending nail border. Once the nail was  Removed, the area was debrided and the underlying skin was intact. The area was irrigated and hemostasis was obtained.  A dry sterile dressing was applied. After application of the dressing the tourniquet was removed and there is found to be an immediate capillary refill time to the digit. The patient tolerated the procedure well any complications. Post procedure instructions were discussed the patient for which he verbally understood. Follow-up in one week for nail check or sooner if any problems are to arise. Discussed signs/symptoms of worsening infection and directed to call the office immediately should any occur or go directly to the emergency room. In the meantime, encouraged to call the office with any questions, concerns, changes symptoms. -Nail sent for biopsy.   Celesta Gentile, DPM

## 2017-03-29 NOTE — Patient Instructions (Signed)
Cholesterol Cholesterol is a white, waxy, fat-like substance that is needed by the human body in small amounts. The liver makes all the cholesterol we need. Cholesterol is carried from the liver by the blood through the blood vessels. Deposits of cholesterol (plaques) may build up on blood vessel (artery) walls. Plaques make the arteries narrower and stiffer. Cholesterol plaques increase the risk for heart attack and stroke. You cannot feel your cholesterol level even if it is very high. The only way to know that it is high is to have a blood test. Once you know your cholesterol levels, you should keep a record of the test results. Work with your health care provider to keep your levels in the desired range. What do the results mean?  Total cholesterol is a rough measure of all the cholesterol in your blood.  LDL (low-density lipoprotein) is the "bad" cholesterol. This is the type that causes plaque to build up on the artery walls. You want this level to be low.  HDL (high-density lipoprotein) is the "good" cholesterol because it cleans the arteries and carries the LDL away. You want this level to be high.  Triglycerides are fat that the body can either burn for energy or store. High levels are closely linked to heart disease. What are the desired levels of cholesterol?  Total cholesterol below 200.  LDL below 100 for people who are at risk, below 70 for people at very high risk.  HDL above 40 is good. A level of 60 or higher is considered to be protective against heart disease.  Triglycerides below 150. How can I lower my cholesterol? Diet Follow your diet program as told by your health care provider.  Choose fish or white meat chicken and turkey, roasted or baked. Limit fatty cuts of red meat, fried foods, and processed meats, such as sausage and lunch meats.  Eat lots of fresh fruits and vegetables.  Choose whole grains, beans, pasta, potatoes, and cereals.  Choose olive oil, corn  oil, or canola oil, and use only small amounts.  Avoid butter, mayonnaise, shortening, or palm kernel oils.  Avoid foods with trans fats.  Drink skim or nonfat milk and eat low-fat or nonfat yogurt and cheeses. Avoid whole milk, cream, ice cream, egg yolks, and full-fat cheeses.  Healthier desserts include angel food cake, ginger snaps, animal crackers, hard candy, popsicles, and low-fat or nonfat frozen yogurt. Avoid pastries, cakes, pies, and cookies.  Exercise  Follow your exercise program as told by your health care provider. A regular program: ? Helps to decrease LDL and raise HDL. ? Helps with weight control.  Do things that increase your activity level, such as gardening, walking, and taking the stairs.  Ask your health care provider about ways that you can be more active in your daily life.  Medicine  Take over-the-counter and prescription medicines only as told by your health care provider. ? Medicine may be prescribed by your health care provider to help lower cholesterol and decrease the risk for heart disease. This is usually done if diet and exercise have failed to bring down cholesterol levels. ? If you have several risk factors, you may need medicine even if your levels are normal.  This information is not intended to replace advice given to you by your health care provider. Make sure you discuss any questions you have with your health care provider. Document Released: 02/23/2001 Document Revised: 12/27/2015 Document Reviewed: 11/29/2015 Elsevier Interactive Patient Education  2017 Elsevier Inc.  

## 2017-04-05 ENCOUNTER — Ambulatory Visit (INDEPENDENT_AMBULATORY_CARE_PROVIDER_SITE_OTHER): Payer: PPO | Admitting: Podiatry

## 2017-04-05 DIAGNOSIS — L601 Onycholysis: Secondary | ICD-10-CM

## 2017-04-05 MED FILL — traMADol HCL 50 MG TABS: 50 | 5 days supply | Qty: 10 | Fill #0

## 2017-04-05 NOTE — Patient Instructions (Signed)

## 2017-04-06 NOTE — Progress Notes (Signed)
Subjective: William [name] is a 76 y.o.  male returns to office today for follow up evaluation after having left Hallux partial nail avulsion performed. Patient has been soaking using epsom salts and applying topical antibiotic covered with bandaid daily. Denies any drainage, pus, redness, red streaks. It has been somewhat sore but it is improving. Patient denies fevers, chills, nausea, vomiting. Denies any calf pain, chest pain, SOB.   Objective:  Vitals: Reviewed  General: Well developed, nourished, in no acute distress, alert and oriented x3   Dermatology: Skin is warm, dry and supple bilateral. Left hallux nail bed appears to be clean, dry, with mild granular tissue and surrounding scab. There is no surrounding erythema, edema, drainage/purulence. The remaining nails appear unremarkable at this time. There are no other lesions or other signs of infection present.  Neurovascular status: Intact. No lower extremity swelling; No pain with calf compression bilateral.  Musculoskeletal: Mild tenderness to palpation of the led hallux nail bed. Muscular strength within normal limits bilateral.   Assesement and Plan: S/p partial nail avulsion, doing well.   -Continue soaking in epsom salts twice a day followed by antibiotic ointment and a band-aid. Can leave uncovered at night. Continue this until completely healed.  -If the area has not healed in 2 weeks, call the office for follow-up appointment, or sooner if any problems arise.  -Monitor for any signs/symptoms of infection. Call the office immediately if any occur or go directly to the emergency room. Call with any questions/concerns.  Celesta Gentile, DPM

## 2017-04-07 ENCOUNTER — Inpatient Hospital Stay: Admit: 2017-04-07 | Discharge: 2017-04-08 | Primary: Internal Medicine

## 2017-04-07 DIAGNOSIS — D696 Thrombocytopenia, unspecified: Secondary | ICD-10-CM

## 2017-04-07 DIAGNOSIS — D631 Anemia in chronic kidney disease: Secondary | ICD-10-CM

## 2017-04-07 DIAGNOSIS — D500 Iron deficiency anemia secondary to blood loss (chronic): Secondary | ICD-10-CM

## 2017-04-07 DIAGNOSIS — N184 Chronic kidney disease, stage 4 (severe): Principal | ICD-10-CM

## 2017-04-15 DIAGNOSIS — N5201 Erectile dysfunction due to arterial insufficiency: Secondary | ICD-10-CM | POA: Diagnosis not present

## 2017-04-18 ENCOUNTER — Telehealth: Payer: Self-pay | Admitting: *Deleted

## 2017-04-18 DIAGNOSIS — Z79899 Other long term (current) drug therapy: Secondary | ICD-10-CM

## 2017-04-18 MED FILL — LORazepam 0.5 MG TABS: 0.5 | 30 days supply | Qty: 150 | Fill #2

## 2017-04-18 MED FILL — LISINOPRIL 20 MG TABLET: 20 | 90 days supply | Qty: 90 | Fill #1

## 2017-04-18 NOTE — Telephone Encounter (Signed)
-----   Message from Trula Slade, DPM sent at 04/13/2017  7:04 PM EDT ----- + fungus. He would benefit more from lamisil than topical. If he does not want to do oral then please prescribe the topical through [place]?Tivis Ringer- can you please call him? Thanks.

## 2017-04-18 NOTE — Telephone Encounter (Signed)
I told pt Dr. Leigh Aurora review of results and recommendations. Pt and wife got on the phone and I informed that to begin the lamisil oral he would need blood work. Pt's wife asked if the recent blood work from his PCP would work and I reviewed, there was no hepatic function or CBC w Diff. I told them the blood work could be performed in the [facility], and once results reviewed the Lamisil would be ordered.

## 2017-04-19 ENCOUNTER — Other Ambulatory Visit: Payer: Self-pay | Admitting: Emergency Medicine

## 2017-04-19 ENCOUNTER — Other Ambulatory Visit (INDEPENDENT_AMBULATORY_CARE_PROVIDER_SITE_OTHER): Payer: PPO

## 2017-04-19 DIAGNOSIS — Z79899 Other long term (current) drug therapy: Secondary | ICD-10-CM

## 2017-04-19 LAB — CBC WITH DIFFERENTIAL/PLATELET
BASOS PCT: 0.8 % (ref 0.0–3.0)
Basophils Absolute: 0.1 10*3/uL (ref 0.0–0.1)
Eosinophils Absolute: 0.2 10*3/uL (ref 0.0–0.7)
Eosinophils Relative: 1.9 % (ref 0.0–5.0)
HEMATOCRIT: 38.7 % — AB (ref 39.0–52.0)
HEMOGLOBIN: 12.9 g/dL — AB (ref 13.0–17.0)
Lymphocytes Relative: 25.9 % (ref 12.0–46.0)
Lymphs Abs: 2 10*3/uL (ref 0.7–4.0)
MCHC: 33.4 g/dL (ref 30.0–36.0)
MCV: 93.6 fl (ref 78.0–100.0)
MONO ABS: 0.5 10*3/uL (ref 0.1–1.0)
Monocytes Relative: 6.9 % (ref 3.0–12.0)
NEUTROS ABS: 5 10*3/uL (ref 1.4–7.7)
Neutrophils Relative %: 64.5 % (ref 43.0–77.0)
Platelets: 244 10*3/uL (ref 150.0–400.0)
RBC: 4.14 Mil/uL — ABNORMAL LOW (ref 4.22–5.81)
RDW: 13.7 % (ref 11.5–15.5)
WBC: 7.8 10*3/uL (ref 4.0–10.5)

## 2017-04-19 LAB — HEPATIC FUNCTION PANEL
ALT: 30 U/L (ref 0–53)
AST: 27 U/L (ref 0–37)
Albumin: 3.9 g/dL (ref 3.5–5.2)
Alkaline Phosphatase: 57 U/L (ref 39–117)
Bilirubin, Direct: 0.1 mg/dL (ref 0.0–0.3)
TOTAL PROTEIN: 7.5 g/dL (ref 6.0–8.3)
Total Bilirubin: 0.4 mg/dL (ref 0.2–1.2)

## 2017-04-20 ENCOUNTER — Encounter: Payer: Self-pay | Admitting: Podiatry

## 2017-04-21 ENCOUNTER — Inpatient Hospital Stay: Admit: 2017-04-21 | Discharge: 2017-04-22 | Primary: Internal Medicine

## 2017-04-21 DIAGNOSIS — D500 Iron deficiency anemia secondary to blood loss (chronic): Secondary | ICD-10-CM

## 2017-04-21 DIAGNOSIS — N184 Chronic kidney disease, stage 4 (severe): Secondary | ICD-10-CM

## 2017-04-21 DIAGNOSIS — D631 Anemia in chronic kidney disease: Secondary | ICD-10-CM

## 2017-04-21 DIAGNOSIS — D696 Thrombocytopenia, unspecified: Principal | ICD-10-CM

## 2017-04-21 MED ORDER — NONFORMULARY OR COMPOUNDED ITEM: 2 refills | Status: DC

## 2017-04-21 NOTE — Telephone Encounter (Signed)
-----   Message from Trula Slade, DPM sent at 04/20/2017  4:42 PM EST ----- Although CBC and liver function is normal, his kidneys are questionable to start the lamisil. I would do the topical to start. Val can you please let him know and go ahead and order the topical through [place]? Thanks.

## 2017-04-21 NOTE — Telephone Encounter (Signed)
Emailed Dr. Leigh Aurora review of results and orders to pt. Dr. Jacqualyn Posey ordered [place] Onychomycosis Nail Lacquer. Faxed orders to [employer].

## 2017-04-27 MED FILL — ZOLPIDEM TARTRATE 10 MG TAB: 10 | 30 days supply | Qty: 30 | Fill #4

## 2017-05-03 ENCOUNTER — Other Ambulatory Visit: Payer: Self-pay | Admitting: Family Medicine

## 2017-05-03 MED FILL — TAMSULOSIN HCL 0.4 MG CAP: 0.4 | 90 days supply | Qty: 90 | Fill #3

## 2017-05-03 MED FILL — carBAMazepine ER 100 MG TB1: 100 | 30 days supply | Qty: 60 | Fill #2

## 2017-05-03 MED FILL — NexIUM 40 MG CPDR: 40 | 90 days supply | Qty: 90 | Fill #0

## 2017-05-11 DIAGNOSIS — N184 Chronic kidney disease, stage 4 (severe): Principal | ICD-10-CM

## 2017-05-12 ENCOUNTER — Inpatient Hospital Stay: Admit: 2017-05-12 | Discharge: 2017-05-13 | Primary: Internal Medicine

## 2017-05-12 DIAGNOSIS — N184 Chronic kidney disease, stage 4 (severe): Principal | ICD-10-CM

## 2017-05-12 DIAGNOSIS — D631 Anemia in chronic kidney disease: Secondary | ICD-10-CM

## 2017-05-24 MED FILL — LORazepam 0.5 MG TABS: 0.5 | 30 days supply | Qty: 150 | Fill #3

## 2017-05-26 ENCOUNTER — Inpatient Hospital Stay: Admit: 2017-05-26 | Discharge: 2017-05-27 | Primary: Internal Medicine

## 2017-05-26 DIAGNOSIS — I130 Hypertensive heart and chronic kidney disease with heart failure and stage 1 through stage 4 chronic kidney disease, or unspecified chronic kidney disease: Secondary | ICD-10-CM

## 2017-05-26 DIAGNOSIS — N184 Chronic kidney disease, stage 4 (severe): Secondary | ICD-10-CM

## 2017-05-26 DIAGNOSIS — E1122 Type 2 diabetes mellitus with diabetic chronic kidney disease: Secondary | ICD-10-CM

## 2017-05-26 DIAGNOSIS — D631 Anemia in chronic kidney disease: Secondary | ICD-10-CM

## 2017-05-26 MED ORDER — EPOETIN ALFA 20000 UNIT/ML IJ SOLN
20000 [IU] | Freq: Once | SUBCUTANEOUS | Status: CN
Start: 2017-05-26 — End: ?

## 2017-05-26 MED ORDER — EPOETIN ALFA 20000 UNIT/ML IJ SOLN
20000 [IU] | Freq: Once | SUBCUTANEOUS | Status: CP
Start: 2017-05-26 — End: ?

## 2017-05-26 NOTE — Progress Notes
Procrit 20,000units given SQ L arm no complications th

## 2017-05-27 MED FILL — ZOLPIDEM TARTRATE 10 MG TAB: 10 | 30 days supply | Qty: 30 | Fill #5

## 2017-05-31 MED FILL — carBAMazepine ER 100 MG TB1: 100 | 30 days supply | Qty: 60 | Fill #3

## 2017-06-02 ENCOUNTER — Encounter: Payer: Self-pay | Admitting: Family

## 2017-06-02 ENCOUNTER — Ambulatory Visit (INDEPENDENT_AMBULATORY_CARE_PROVIDER_SITE_OTHER): Payer: PPO | Admitting: Family

## 2017-06-02 ENCOUNTER — Ambulatory Visit (HOSPITAL_COMMUNITY)
Admission: RE | Admit: 2017-06-02 | Discharge: 2017-06-02 | Disposition: A | Discharge status: Home / Self Care | Payer: PPO | Source: Ambulatory Visit | Attending: Family | Admitting: Family

## 2017-06-02 VITALS — BP 150/82 | HR 59 | Temp 97.7°F | Resp 18 | Wt 234.9 lb

## 2017-06-02 DIAGNOSIS — I6523 Occlusion and stenosis of bilateral carotid arteries: Secondary | ICD-10-CM | POA: Insufficient documentation

## 2017-06-02 DIAGNOSIS — Z9889 Other specified postprocedural states: Secondary | ICD-10-CM | POA: Diagnosis not present

## 2017-06-02 DIAGNOSIS — Z87891 Personal history of nicotine dependence: Secondary | ICD-10-CM | POA: Diagnosis not present

## 2017-06-02 LAB — VAS US CAROTID
LCCADSYS: -52 cm/s
LEFT ECA DIAS: -15 cm/s
LEFT VERTEBRAL DIAS: -14 cm/s
LICADSYS: -87 cm/s
Left CCA dist dias: -12 cm/s
Left CCA prox dias: 20 cm/s
Left CCA prox sys: 113 cm/s
Left ICA dist dias: -27 cm/s
Left ICA prox dias: -15 cm/s
Left ICA prox sys: -42 cm/s
RCCADSYS: -58 cm/s
RCCAPDIAS: -9 cm/s
RIGHT CCA MID DIAS: -13 cm/s
RIGHT ECA DIAS: -14 cm/s
RIGHT VERTEBRAL DIAS: -10 cm/s
Right CCA prox sys: -58 cm/s

## 2017-06-02 NOTE — Progress Notes (Signed)
Chief Complaint: Follow up Extracranial Carotid Artery Stenosis   History of Present Illness  William [name] is a 76 y.o. male who is status post right CEA in 2006, left CEA in 2010 by Dr. Oneida Alar, returns today for follow up.   Patient had a preoperative stroke in 2006, symptoms manifested as trouble getting out of bed during the night, left arm and leg weakness with slurred speech, vision was affected but wife and patient do not remember which eye, also had left facial drooping.  The patient's previous neurologic deficits are improved, he still has left arm and leg weakness, left facial drooping. He has had no further stroke or TIA symptoms since 2006.  Pt states the chronic tongue moving habit started about June 2015, he attributes to a medication that causes dry mouth.   Pt denies claudication symptoms with walking, denies non healing wounds. Works out in a gym twice/week.   Pt states he has white coat syndrome, blood pressure is elevated now.   Pt Diabetic: No Pt smoker: former smoker, quit in 2006  Pt meds include: Statin : no tried several, caused myalgias in legs ASA: Yes, 325 mg daily Other anticoagulants/antiplatelets: no     Past Medical History:  Diagnosis Date  . ANXIETY   . Arthritis    low back  . Bladder stone   . BPH (benign prostatic hypertrophy)   . Carotid artery occlusion   . 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 CVD (cerebrovascular disease)   . Eczema   . GERD (gastroesophageal reflux disease)   . History of carotid artery stenosis    S/P BILATERAL CEA  . 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 Hyperlipidemia   . Hypertension   . Nocturia   . S/P carotid endarterectomy    BILATERAL ICA--  PATENT PER DUPLEX  05-19-2012  . Stroke [facility]) 1996   pt states no weaknes but has severe anxiety  . Urinary frequency   . Vitamin D deficiency     Social History Social History   Tobacco Use  .  Smoking status: Former Smoker    Packs/day: 2.00    Years: 40.00    Pack years: 80.00    Types: Cigarettes    Last attempt to quit: 02/15/2005    Years since quitting: 12.3  . Smokeless tobacco: Never Used  Substance Use Topics  . Alcohol use: Yes    Alcohol/week: 0.0 oz    Comment: Occasional  . Drug use: No    Family History Family History  Problem Relation Age of Onset  . Heart disease Mother        CHF  . Bipolar disorder Mother   . Heart disease Father        CHF    Surgical History Past Surgical History:  Procedure Laterality Date  . APPENDECTOMY  AS CHILD  . CARDIOVASCULAR STRESS TEST  03-27-2012  DR CRENSHAW   LOW RISK LEXISCAN STUDY-- PROBABLE NORMAL PERFUSION AND SOFT TISSUE ATTENUATION/  NO ISCHEMIA/ EF 51%  . CAROTID ENDARTERECTOMY Bilateral LEFT  11-12-2008  DR GREG HAYES   RIGHT ICA  2006  ([facility])  . CYSTOSCOPY WITH LITHOLAPAXY N/A 02/26/2013   Procedure: CYSTOSCOPY WITH LITHOLAPAXY;  Surgeon: Franchot Gallo, MD;  Location: [facility];  Service: Urology;  Laterality: N/A;  . EYE SURGERY  Jan. 2016   cataract surgery both eyes  . INGUINAL HERNIA REPAIR Right 11-08-2006  .  MASS EXCISION N/A 03/03/2016   Procedure: EXCISION OF BACK  MASS;  Surgeon: Stark Klein, MD;  Location: [place];  Service: General;  Laterality: N/A;  . 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 PROSTATE SURGERY    . TRANSURETHRAL RESECTION OF PROSTATE N/A 02/26/2013   Procedure: TRANSURETHRAL RESECTION OF THE PROSTATE WITH GYRUS INSTRUMENTS;  Surgeon: Franchot Gallo, MD;  Location: [facility];  Service: Urology;  Laterality: N/A;    Allergies  Allergen Reactions  . Adhesive [Tape] Other (See Comments)    BLISTER  . Statins Other (See Comments)    myalgias  . Strawberry (Diagnostic) Hives    Current Outpatient Medications  Medication Sig Dispense Refill  . aspirin 325 MG EC tablet Take 325 mg by mouth daily.    . B  Complex Vitamins (VITAMIN B COMPLEX PO) Take by mouth daily.      . Calcium Carb-Cholecalciferol (CALCIUM 1000 + D PO) Take by mouth daily.      . carbamazepine (TEGRETOL-XR) 100 MG 12 hr tablet 1 qam  1  qhs 60 tablet 8  . Cholecalciferol (VITAMIN D) 2000 UNITS CAPS Take by mouth.    . Coenzyme Q10 (COQ10) 200 MG CAPS Take 1 capsule by mouth daily.    . Eszopiclone 3 MG TABS TAKE 1 TABLET BY MOUTH AT BEDTIME AS NEEDED TAKE IMMEDIATELY BEFORE BEDTIME 30 tablet 1  . ezetimibe (ZETIA) 10 MG tablet Take 1 tablet (10 mg total) by mouth daily. 90 tablet 3  . fenofibrate 160 MG tablet TAKE 1 TABLET BY MOUTH DAILY. REPEAT LABS ARE DUE NOW 90 tablet 0  . Flaxseed, Linseed, (FLAX SEED OIL PO) Take by mouth daily.      . fluticasone (CUTIVATE) 0.05 % cream   3  . levocetirizine (XYZAL) 5 MG tablet Take 1 tablet (5 mg total) by mouth every evening. (Patient taking differently: Take 5 mg by mouth daily as needed. ) 30 tablet 5  . lisinopril (PRINIVIL,ZESTRIL) 20 MG tablet TAKE 1 TABLET (20 MG TOTAL) BY MOUTH DAILY. 90 tablet 3  . LORazepam (ATIVAN) 0.5 MG tablet 2 bid  1  qday  prn 150 tablet 5  . Multiple Vitamin (MULTIVITAMIN) tablet Take 1 tablet by mouth daily.    [name] NEXIUM 40 MG capsule TAKE 1 CAPSULE BY MOUTH ONCE DAILY AS NEEDED 90 capsule 0  . NONFORMULARY OR COMPOUNDED ITEM [facility]:  Onychomycosis Nail lacquer - Fluconazole 2%, Terbinafine 1%, DMSO, apply to affected area daily. 120 each 2  . tamsulosin (FLOMAX) 0.4 MG CAPS capsule 1 po qd 30 capsule   . traMADol (ULTRAM) 50 MG tablet Take 1 tablet (50 mg total) by mouth every 12 (twelve) hours as needed. 10 tablet 0   No current facility-administered medications for this visit.     Review of Systems : See HPI for pertinent positives and negatives.  Physical Examination  Vitals:   06/02/17 1039 06/02/17 1042 06/02/17 1119  BP: (!) 170/90 (!) 172/93 (!) 150/82  Pulse: (!) 59    Resp: 18    Temp: 97.7 F (36.5 C)    TempSrc: Oral     SpO2: 98%    Weight: 234 lb 14.4 oz (106.5 kg)     Body mass index is 32.76 kg/m.  General: WDWN obese male in NAD GAIT:normal Eyes: PERRLA Pulmonary: Respirations are non labored, CTAB, good air movement in all fields. Cardiac: regular rhythm, no detected murmur.  VASCULAR  EXAM Carotid Bruits Left Right   Negative Negative    Abdominal aortic pulse is not palpable. Radial pulses: 2+ right, 1+ left.      LE Pulses LEFT RIGHT   POPLITEAL  palpable   palpable   POSTERIOR TIBIAL not palpable  not palpable    DORSALIS PEDIS  ANTERIOR TIBIAL palpable  palpable     Gastrointestinal: soft, nontender, BS WNL, no r/g, no palpable masses.  Musculoskeletal: No muscle atrophy/wasting. M/S 5/5 throughout except he is unable to raise his left arm as high as his right, Extremities without ischemic changes.  Skin: no rash, no cellulitis, no ulcers noted.   Neurologic: A&O X 3; Appropriate Affect,speech is normal, CN 2-12 intact except left facial droop with smile only, pain and light touch intact in extremities, Motor exam as listed above     Assessment: William [name] is a 76 y.o. male who is status post right CEA in 2006, left CEA in 2010. He has a hx of a preoperative stroke in 2006, no strokes or TIA's subsequently.    DATA Carotid Duplex (06/02/17): <40% bilateral CEA site stenosis. Bilateral vertebral artery flow is antegrade.  Bilateral subclavian artery waveforms are normal.   No significant change comared to the exams on 05-26-15 and 05-27-16.  ABI's on 12-27-16 were normal with all triphasic waveforms.    Plan: Follow-up in 18 months with Carotid Duplex scan.     I discussed in depth with the patient the nature of atherosclerosis, and emphasized the  importance of maximal medical management including strict control of blood pressure, blood glucose, and lipid levels, obtaining regular exercise, and continued cessation of smoking.  The patient is aware that without maximal medical management the underlying atherosclerotic disease process will progress, limiting the benefit of any interventions. The patient was given information about stroke prevention and what symptoms should prompt the patient to seek immediate medical care. Thank you for allowing [place] to participate in this patient's care.  Clemon Chambers, RN, MSN, FNP-C [facility] Office: [phone]  Clinic Physician: Oneida Alar  06/02/17 11:21 AM

## 2017-06-02 NOTE — Patient Instructions (Signed)
Stroke Prevention Some health problems and behaviors may make it more likely for you to have a stroke. Below are ways to lessen your risk of having a stroke.  Be active for at least 30 minutes on most or all days.  Do not smoke. Try not to be around others who smoke.  Do not drink too much alcohol. ? Do not have more than 2 drinks a day if you are a man. ? Do not have more than 1 drink a day if you are a woman and are not pregnant.  Eat healthy foods, such as fruits and vegetables. If you were put on a specific diet, follow the diet as told.  Keep your cholesterol levels under control through diet and medicines. Look for foods that are low in saturated fat, trans fat, cholesterol, and are high in fiber.  If you have diabetes, follow all diet plans and take your medicine as told.  Ask your doctor if you need treatment to lower your blood pressure. If you have high blood pressure (hypertension), follow all diet plans and take your medicine as told by your doctor.  If you are 18-39 years old, have your blood pressure checked every 3-5 years. If you are age 40 or older, have your blood pressure checked every year.  Keep a healthy weight. Eat foods that are low in calories, salt, saturated fat, trans fat, and cholesterol.  Do not take drugs.  Avoid birth control pills, if this applies. Talk to your doctor about the risks of taking birth control pills.  Talk to your doctor if you have sleep problems (sleep apnea).  Take all medicine as told by your doctor. ? You may be told to take aspirin or blood thinner medicine. Take this medicine as told by your doctor. ? Understand your medicine instructions.  Make sure any other conditions you have are being taken care of.  Get help right away if:  You suddenly lose feeling (you feel numb) or have weakness in your face, arm, or leg.  Your face or eyelid hangs down to one side.  You suddenly feel confused.  You have trouble talking  (aphasia) or understanding what people are saying.  You suddenly have trouble seeing in one or both eyes.  You suddenly have trouble walking.  You are dizzy.  You lose your balance or your movements are clumsy (uncoordinated).  You suddenly have a very bad headache and you do not know the cause.  You have new chest pain.  Your heart feels like it is fluttering or skipping a beat (irregular heartbeat). Do not wait to see if the symptoms above go away. Get help right away. Call your local emergency services (911 in [place] Do not drive yourself to the hospital. This information is not intended to replace advice given to you by your health care provider. Make sure you discuss any questions you have with your health care provider. Document Released: 11/30/2011 Document Revised: 11/06/2015 Document Reviewed: 12/01/2012 Elsevier Interactive Patient Education  2018 [employer]     

## 2017-06-03 ENCOUNTER — Encounter (HOSPITAL_COMMUNITY): Payer: Self-pay | Admitting: Psychiatry

## 2017-06-03 ENCOUNTER — Ambulatory Visit (HOSPITAL_COMMUNITY): Payer: PPO | Admitting: Psychiatry

## 2017-06-03 DIAGNOSIS — Z87891 Personal history of nicotine dependence: Secondary | ICD-10-CM

## 2017-06-03 DIAGNOSIS — F0630 Mood disorder due to known physiological condition, unspecified: Secondary | ICD-10-CM

## 2017-06-03 DIAGNOSIS — Z818 Family history of other mental and behavioral disorders: Secondary | ICD-10-CM | POA: Diagnosis not present

## 2017-06-03 MED ORDER — ESZOPICLONE 3 MG PO TABS: ORAL_TABLET | ORAL | 1 refills | Status: DC

## 2017-06-03 MED ORDER — LORAZEPAM 0.5 MG PO TABS: ORAL_TABLET | ORAL | 5 refills | Status: DC

## 2017-06-03 MED ORDER — CARBAMAZEPINE ER 100 MG PO TB12: ORAL_TABLET | ORAL | 1 refills | Status: DC

## 2017-06-03 MED ORDER — CARBAMAZEPINE ER 100 MG PO TB12: ORAL_TABLET | ORAL | 8 refills | Status: DC

## 2017-06-03 NOTE — Progress Notes (Signed)
Patient ID: William [name], male   DOB: 23-Jun-1940, 76 y.o.   MRN: [id number] [facility] MD Progress Note  06/03/2017 11:40 AM William [name]  MRN:  [id number] Subjective:  Feeling good Principal Problem:  Today the patient is doing well. Spirits are good. He denies any vegetative symptoms. He denies any anxiety. He's never been psychotic. His energy is good. He goes and works out twice a week. He takes his medicines just as prescribed. The Tegretol very helpful in controlling his mood states. Continues taking a low-dose of Ativan. His anxiety is well-controlled. He is seen with his wife. Overall physically is doing very well. He's active and energetic very engageable. She denies the use of alcohol or drugs. Is not suicidal.  Past Medical History:  Past Medical History:  Diagnosis Date  . ANXIETY   . Arthritis    low back  . Bladder stone   . BPH (benign prostatic hypertrophy)   . Carotid artery occlusion   . 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 CVD (cerebrovascular disease)   . Eczema   . GERD (gastroesophageal reflux disease)   . History of carotid artery stenosis    S/P BILATERAL CEA  . 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 Hyperlipidemia   . Hypertension   . Nocturia   . S/P carotid endarterectomy    BILATERAL ICA--  PATENT PER DUPLEX  05-19-2012  . Stroke [facility]) 1996   pt states no weaknes but has severe anxiety  . Urinary frequency   . Vitamin D deficiency     Past Surgical History:  Procedure Laterality Date  . APPENDECTOMY  AS CHILD  . CARDIOVASCULAR STRESS TEST  03-27-2012  DR CRENSHAW   LOW RISK LEXISCAN STUDY-- PROBABLE NORMAL PERFUSION AND SOFT TISSUE ATTENUATION/  NO ISCHEMIA/ EF 51%  . CAROTID ENDARTERECTOMY Bilateral LEFT  11-12-2008  DR GREG HAYES   RIGHT ICA  2006  ([facility])  . CYSTOSCOPY WITH LITHOLAPAXY N/A 02/26/2013   Procedure: CYSTOSCOPY WITH LITHOLAPAXY;  Surgeon: Franchot Gallo, MD;  Location: [facility];  Service: Urology;  Laterality: N/A;  . EYE SURGERY  Jan. 2016   cataract surgery both eyes  . INGUINAL HERNIA REPAIR Right 11-08-2006  . MASS EXCISION N/A 03/03/2016   Procedure: EXCISION OF BACK  MASS;  Surgeon: Stark Klein, MD;  Location: [place];  Service: General;  Laterality: N/A;  . 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 PROSTATE SURGERY    . TRANSURETHRAL RESECTION OF PROSTATE N/A 02/26/2013   Procedure: TRANSURETHRAL RESECTION OF THE PROSTATE WITH GYRUS INSTRUMENTS;  Surgeon: Franchot Gallo, MD;  Location: [facility];  Service: Urology;  Laterality: N/A;   Family History:  Family History  Problem Relation Age of Onset  . Heart disease Mother        CHF  . Bipolar disorder Mother   . Heart disease Father        CHF   Family Psychiatric  History:  Social History:  Social History   Substance and Sexual Activity  Alcohol Use Yes  . Alcohol/week: 0.0 oz   Comment: Occasional     Social History   Substance and Sexual Activity  Drug Use No    Social History   Socioeconomic History  . Marital status: Married    Spouse name: None  . Number of children: 2  . Years of education:  None  . Highest education level: None  Social Needs  . Financial resource strain: None  . Food insecurity - worry: None  . Food insecurity - inability: None  . Transportation needs - medical: None  . Transportation needs - non-medical: None  Occupational History    Employer: Retired  Tobacco Use  . Smoking status: Former Smoker    Packs/day: 2.00    Years: 40.00    Pack years: 80.00    Types: Cigarettes    Last attempt to quit: 02/15/2005    Years since quitting: 12.3  . Smokeless tobacco: Never Used  Substance and Sexual Activity  . Alcohol use: Yes    Alcohol/week: 0.0 oz    Comment: Occasional  . Drug use: No  . Sexual activity: Yes    Partners: Female  Other Topics Concern  . None  Social History Narrative    Exercise--  Engineer, manufacturing everyday   Additional Social History:                         Sleep: Good  Appetite:  Good  Current Medications: Current Outpatient Medications  Medication Sig Dispense Refill  . aspirin 325 MG EC tablet Take 325 mg by mouth daily.    . B Complex Vitamins (VITAMIN B COMPLEX PO) Take by mouth daily.      . Calcium Carb-Cholecalciferol (CALCIUM 1000 + D PO) Take by mouth daily.      . carbamazepine (TEGRETOL-XR) 100 MG 12 hr tablet 1 qam  1  qhs 180 tablet 1  . Cholecalciferol (VITAMIN D) 2000 UNITS CAPS Take by mouth.    . Coenzyme Q10 (COQ10) 200 MG CAPS Take 1 capsule by mouth daily.    . Eszopiclone 3 MG TABS TAKE 1 TABLET BY MOUTH AT BEDTIME AS NEEDED TAKE IMMEDIATELY BEFORE BEDTIME 90 tablet 1  . ezetimibe (ZETIA) 10 MG tablet Take 1 tablet (10 mg total) by mouth daily. 90 tablet 3  . fenofibrate 160 MG tablet TAKE 1 TABLET BY MOUTH DAILY. REPEAT LABS ARE DUE NOW 90 tablet 0  . Flaxseed, Linseed, (FLAX SEED OIL PO) Take by mouth daily.      . fluticasone (CUTIVATE) 0.05 % cream   3  . levocetirizine (XYZAL) 5 MG tablet Take 1 tablet (5 mg total) by mouth every evening. (Patient taking differently: Take 5 mg by mouth daily as needed. ) 30 tablet 5  . lisinopril (PRINIVIL,ZESTRIL) 20 MG tablet TAKE 1 TABLET (20 MG TOTAL) BY MOUTH DAILY. 90 tablet 3  . LORazepam (ATIVAN) 0.5 MG tablet 2 bid  1  qday  prn 150 tablet 5  . Multiple Vitamin (MULTIVITAMIN) tablet Take 1 tablet by mouth daily.    [name] NEXIUM 40 MG capsule TAKE 1 CAPSULE BY MOUTH ONCE DAILY AS NEEDED 90 capsule 0  . NONFORMULARY OR COMPOUNDED ITEM [facility]:  Onychomycosis Nail lacquer - Fluconazole 2%, Terbinafine 1%, DMSO, apply to affected area daily. 120 each 2  . tamsulosin (FLOMAX) 0.4 MG CAPS capsule 1 po qd 30 capsule   . traMADol (ULTRAM) 50 MG tablet Take 1 tablet (50 mg total) by mouth every 12 (twelve) hours as needed. 10 tablet 0   No current facility-administered  medications for this visit.     Lab Results: No results found for this or any previous visit (from the past 48 hour(s)).  Physical Findings: AIMS:  , ,  ,  ,    CIWA:  COWS:     Musculoskeletal: Strength & Muscle Tone: within normal limits Gait & Station: normal Patient leans: Right  Psychiatric Specialty Exam: ROS  Blood pressure 132/74, pulse 63, height 5' 10.5" (1.791 m), weight 234 lb 9.6 oz (106.4 kg).Body mass index is 33.19 kg/m.  General Appearance: Casual  Eye Contact::  Good  Speech:  Clear and Coherent  Volume:  Normal  Mood:  Dysphoric and Euthymic  Affect:  Appropriate  Thought Process:  Coherent  Orientation:  Full (Time, Place, and Person)  Thought Content:  WDL  Suicidal Thoughts:  No  Homicidal Thoughts:  No  Memory:  NA  Judgement:  Good  Insight:  Fair  Psychomotor Activity:  Normal  Concentration:  Good  Recall:  Good  Fund of Knowledge:Good  Language: Good  Akathisia:  No  Handed:  Right  AIMS (if indicated):     Assets:  Desire for Improvement  ADL's:  Intact  Cognition: WNL  Sleep:      Treatment Plan Summary: 06/03/2017, 11:40 AM  We successfully changed his medicine to [facility] and now is. He continues taking all the other medication. This includes Tegretol and Ativan. The patient is not oversedated. His mood is well-controlled. He is in a good spirits. He'll return to see me in 4 months for a 30 minute visit.

## 2017-06-06 NOTE — Addendum Note (Signed)
Addended by: Lianne Cure A on: 06/06/2017 10:38 AM   Modules accepted: Orders

## 2017-06-09 ENCOUNTER — Inpatient Hospital Stay: Admit: 2017-06-09 | Discharge: 2017-06-10 | Primary: Internal Medicine

## 2017-06-09 DIAGNOSIS — N184 Chronic kidney disease, stage 4 (severe): Principal | ICD-10-CM

## 2017-06-09 DIAGNOSIS — D631 Anemia in chronic kidney disease: Secondary | ICD-10-CM

## 2017-06-09 DIAGNOSIS — D696 Thrombocytopenia, unspecified: Secondary | ICD-10-CM

## 2017-06-09 DIAGNOSIS — D500 Iron deficiency anemia secondary to blood loss (chronic): Secondary | ICD-10-CM

## 2017-06-09 MED ORDER — EPOETIN ALFA 20000 UNIT/ML IJ SOLN
20000 [IU] | Freq: Once | SUBCUTANEOUS | Status: CN
Start: 2017-06-09 — End: ?

## 2017-06-09 MED ORDER — EPOETIN ALFA 20000 UNIT/ML IJ SOLN
20000 [IU] | Freq: Once | SUBCUTANEOUS | Status: CP
Start: 2017-06-09 — End: ?

## 2017-06-15 ENCOUNTER — Other Ambulatory Visit: Payer: Self-pay | Admitting: Family Medicine

## 2017-06-15 MED FILL — FENOFIBRATE 160 MG TABLET: 160 | 90 days supply | Qty: 90 | Fill #0

## 2017-06-23 ENCOUNTER — Inpatient Hospital Stay: Admit: 2017-06-23 | Discharge: 2017-06-24 | Primary: Internal Medicine

## 2017-06-23 DIAGNOSIS — I509 Heart failure, unspecified: Secondary | ICD-10-CM

## 2017-06-23 DIAGNOSIS — N184 Chronic kidney disease, stage 4 (severe): Secondary | ICD-10-CM

## 2017-06-23 DIAGNOSIS — I130 Hypertensive heart and chronic kidney disease with heart failure and stage 1 through stage 4 chronic kidney disease, or unspecified chronic kidney disease: Secondary | ICD-10-CM

## 2017-06-23 DIAGNOSIS — D696 Thrombocytopenia, unspecified: Secondary | ICD-10-CM

## 2017-06-23 DIAGNOSIS — D631 Anemia in chronic kidney disease: Secondary | ICD-10-CM

## 2017-06-23 DIAGNOSIS — N189 Chronic kidney disease, unspecified: Secondary | ICD-10-CM

## 2017-06-23 MED ORDER — EPOETIN ALFA 20000 UNIT/ML IJ SOLN
20000 [IU] | Freq: Once | SUBCUTANEOUS | Status: CP
Start: 2017-06-23 — End: ?

## 2017-06-23 MED ORDER — EPOETIN ALFA 20000 UNIT/ML IJ SOLN
20000 [IU] | Freq: Once | SUBCUTANEOUS | Status: CN
Start: 2017-06-23 — End: ?

## 2017-06-27 MED FILL — carBAMazepine ER 100 MG TB1: 100 | 30 days supply | Qty: 60 | Fill #4

## 2017-06-27 MED FILL — EZETIMIBE 10 MG TABS: 10 | 90 days supply | Qty: 90 | Fill #1

## 2017-06-29 ENCOUNTER — Other Ambulatory Visit: Payer: Self-pay

## 2017-06-29 MED FILL — ESZOPICLONE 3 MG TABS: 3 | 90 days supply | Qty: 90 | Fill #0

## 2017-06-29 MED FILL — LORazepam 0.5 MG TABS: 0.5 | 30 days supply | Qty: 150 | Fill #0

## 2017-06-30 ENCOUNTER — Ambulatory Visit: Payer: PPO | Admitting: Podiatry

## 2017-06-30 ENCOUNTER — Encounter: Payer: Self-pay | Admitting: Podiatry

## 2017-06-30 DIAGNOSIS — L603 Nail dystrophy: Secondary | ICD-10-CM

## 2017-07-03 NOTE — Progress Notes (Signed)
Subjective: Mr. Dines presents the office today with his wife for evaluation of his left big toe.  He previously had the nail removed due to a subungual hematoma and onychomycosis.  The nail certainly come back again but he is not sure if it looks correctly he wants to have the area checked.  Denies any pain to the area currently but at times the toe does get sensitive in shoes.  Denies any redness or drainage or any swelling.  He said no treatment to the nail since I last saw him other than he has been using the topical antifungal. Denies any systemic complaints such as fevers, chills, nausea, vomiting. No acute changes since last appointment, and no other complaints at this time.   Objective: AAO x3, NAD DP/PT pulses palpable bilaterally, CRT less than 3 seconds There is some loose skin to the distal aspect the left hallux toe and this is some callus area that is coming off.  There is a new nail at the base of the toenail starting to come in which does appear to be dystrophic, discolored with yellow discoloration mildly hypertrophic as well.  There is no tenderness palpation of the area today and there is no surrounding erythema, ascending cellulitis.  Is no fluctuation or crepitation.  There is no malodor. No open lesions or pre-ulcerative lesions.  No pain with calf compression, swelling, warmth, erythema  Assessment: Onychodystrophy/onychomycosis left hallux toenail  Plan: -All treatment options discussed with the patient including all alternatives, risks, complications.  -At this time there is a start of nail starting to come in but does appear to be thick and discolored.  I want him to continue the topical antifungal ever have to watch and wait to see how the toenail grows in.  The nail prior to the procedure was loose and had blood underneath the nail which is likely from microtrauma and therefore the nail itself is going to likely grow back in the same.  We will try to treat with antifungal  but if we need to switch to a different medication, Nuvail or other treatment.  He agrees this plan he has no further questions or concerns today. -Patient encouraged to call the office with any questions, concerns, change in symptoms.   Trula Slade DPM

## 2017-07-07 ENCOUNTER — Inpatient Hospital Stay: Admit: 2017-07-07 | Discharge: 2017-07-08 | Primary: Internal Medicine

## 2017-07-07 ENCOUNTER — Ambulatory Visit: Admit: 2017-07-07 | Discharge: 2017-07-08 | Attending: Hematology & Oncology | Primary: Internal Medicine

## 2017-07-07 DIAGNOSIS — I6350 Cerebral infarction due to unspecified occlusion or stenosis of unspecified cerebral artery: Secondary | ICD-10-CM

## 2017-07-07 DIAGNOSIS — I2510 Atherosclerotic heart disease of native coronary artery without angina pectoris: Secondary | ICD-10-CM

## 2017-07-07 DIAGNOSIS — I4891 Unspecified atrial fibrillation: Secondary | ICD-10-CM

## 2017-07-07 DIAGNOSIS — I509 Heart failure, unspecified: Secondary | ICD-10-CM

## 2017-07-07 DIAGNOSIS — E119 Type 2 diabetes mellitus without complications: Secondary | ICD-10-CM

## 2017-07-07 DIAGNOSIS — E785 Hyperlipidemia, unspecified: Secondary | ICD-10-CM

## 2017-07-07 DIAGNOSIS — Z955 Presence of coronary angioplasty implant and graft: Secondary | ICD-10-CM

## 2017-07-07 DIAGNOSIS — I110 Hypertensive heart disease with heart failure: Secondary | ICD-10-CM

## 2017-07-07 DIAGNOSIS — I639 Cerebral infarction, unspecified: Secondary | ICD-10-CM

## 2017-07-07 DIAGNOSIS — Z79899 Other long term (current) drug therapy: Secondary | ICD-10-CM

## 2017-07-07 DIAGNOSIS — D509 Iron deficiency anemia, unspecified: Principal | ICD-10-CM

## 2017-07-07 DIAGNOSIS — E611 Iron deficiency: Secondary | ICD-10-CM

## 2017-07-07 DIAGNOSIS — D631 Anemia in chronic kidney disease: Secondary | ICD-10-CM

## 2017-07-07 DIAGNOSIS — F1010 Alcohol abuse, uncomplicated: Secondary | ICD-10-CM

## 2017-07-07 DIAGNOSIS — N184 Chronic kidney disease, stage 4 (severe): Secondary | ICD-10-CM

## 2017-07-07 DIAGNOSIS — K5730 Diverticulosis of large intestine without perforation or abscess without bleeding: Secondary | ICD-10-CM

## 2017-07-07 DIAGNOSIS — I10 Essential (primary) hypertension: Secondary | ICD-10-CM

## 2017-07-07 MED ORDER — FERRIC CARBOXYMALTOSE IVPB
750 mg | Freq: Once | INTRAVENOUS | Status: CN
Start: 2017-07-07 — End: ?

## 2017-07-07 MED ORDER — SODIUM CHLORIDE 0.9 % IV SOLN
Freq: Once | INTRAVENOUS | Status: CN
Start: 2017-07-07 — End: ?

## 2017-07-07 MED ORDER — DIPHENHYDRAMINE HCL 50 MG/ML IJ SOLN
25 mg | INTRAVENOUS | Status: CN | PRN
Start: 2017-07-07 — End: ?

## 2017-07-07 MED ORDER — HEPARIN SODIUM LOCK FLUSH 100 UNIT/ML IV SOLN
500 [IU] | Status: CN | PRN
Start: 2017-07-07 — End: ?

## 2017-07-07 MED ORDER — MEPERIDINE HCL 25 MG/ML IJ SOLN
25 mg | INTRAVENOUS | Status: CN | PRN
Start: 2017-07-07 — End: ?

## 2017-07-07 MED ORDER — HYDROCORTISONE NA SUCCINATE PF 100 MG IJ SOLR
100 mg | INTRAVENOUS | Status: CN | PRN
Start: 2017-07-07 — End: ?

## 2017-07-07 MED ORDER — DEXAMETHASONE SODIUM PHOSPHATE 20 MG/5ML IJ SOLN
20 mg | INTRAVENOUS | Status: CN | PRN
Start: 2017-07-07 — End: ?

## 2017-07-07 MED ORDER — EPINEPHRINE 0.3 MG/0.3ML IJ SOAJ
0.3 mg | INTRAMUSCULAR | Status: CN | PRN
Start: 2017-07-07 — End: ?

## 2017-07-07 MED ORDER — SODIUM CHLORIDE FLUSH 0.9 % IV SOLN
10 mL | Status: CN | PRN
Start: 2017-07-07 — End: ?

## 2017-07-07 NOTE — Addendum Note
Addended by: Wonda Cheng on: 07/07/2017 10:14 AM     Modules accepted: Orders

## 2017-07-07 NOTE — Progress Notes
The past/family/social history and review of systems remains accurate as of today's date 07/07/2017      Medications:   Current Outpatient Prescriptions:   ?  atorvastatin (LIPITOR) 40 MG PO Tablet, Take 40 mg by mouth daily., Disp: , Rfl:   ?  diclofenac (VOLTAREN) 1 % TD Gel, Apply 4 g topically as needed for pain., Disp: , Rfl:   ?  docusate calcium (SURFAK) 240 MG PO Capsule, Take 240 mg by mouth 2 times daily as needed for constipation., Disp: , Rfl:       Labs:   Hospital Outpatient Visit on 07/07/2017   Component Date Value   ? WBC 07/07/2017 3.59*   ? RBC 07/07/2017 3.13    ? Hemoglobin 07/07/2017 8.7*   ? Hematocrit 07/07/2017 27.9*   ? MCV 07/07/2017 89.1    ? [facility] 07/07/2017 27.8    ? MCHC 07/07/2017 31.2    ? RDW 07/07/2017 15.1    ? Platelet Count 07/07/2017 158    ? MPV 07/07/2017 9.4    ? Neutrophils Absolute 07/07/2017 2.54    ? Neutrophils % 07/07/2017 70.8    ? Lymphocytes Absolute 07/07/2017 0.31    ? Lymphocytes % 07/07/2017 8.6*   ? Monocytes Absolute 07/07/2017 0.55    ? Monocytes % 07/07/2017 15.3    ? Eosinophils Absolute 07/07/2017 0.18    ? Eosinophils % 07/07/2017 5.0    ? Basophil Absolute 07/07/2017 0.01    ? BASOPHILS 07/07/2017 0.3    Hospital Outpatient Visit on 06/23/2017   Component Date Value   ? Sodium 06/23/2017 137    ? Potassium 06/23/2017 3.8    ? Chloride 06/23/2017 97*   ? CO2 06/23/2017 30*   ? Urea Nitrogen 06/23/2017 20    ? Creatinine 06/23/2017 0.94    ? BUN/Creatinine Ratio 06/23/2017 21.3    ? Glucose 06/23/2017 163*   ? Calcium 06/23/2017 8.8    ? Anion Gap 06/23/2017 10    ? EGFR 06/23/2017 >59    ? Ferritin 06/23/2017 92.1    ? Iron 06/23/2017 31*   ? Transferrin 06/23/2017 168*   ? TIBC 06/23/2017 213*   ? Iron Saturation 06/23/2017 15*   ? WBC 06/23/2017 4.04*   ? RBC 06/23/2017 3.28    ? Hemoglobin 06/23/2017 9.6*   ? Hematocrit 06/23/2017 30.4*   ? MCV 06/23/2017 92.7    ? [facility] 06/23/2017 29.3    ? MCHC 06/23/2017 31.6    ? RDW 06/23/2017 14.1

## 2017-07-07 NOTE — Progress Notes
?   Platelet Count 06/23/2017 172    ? MPV 06/23/2017 9.7    ? Neutrophils Absolute 06/23/2017 3.06    ? Neutrophils % 06/23/2017 75.8    ? Lymphocytes Absolute 06/23/2017 0.34    ? Lymphocytes % 06/23/2017 8.4*   ? Monocytes Absolute 06/23/2017 0.46    ? Monocytes % 06/23/2017 11.4    ? Eosinophils Absolute 06/23/2017 0.17    ? Eosinophils % 06/23/2017 4.2    ? Basophil Absolute 06/23/2017 0.01    ? BASOPHILS 06/23/2017 0.2    Hospital Outpatient Visit on 06/09/2017   Component Date Value   ? WBC 06/09/2017 4.24*   ? RBC 06/09/2017 3.28    ? Hemoglobin 06/09/2017 9.8*   ? Hematocrit 06/09/2017 31.0*   ? MCV 06/09/2017 94.5    ? [facility] 06/09/2017 29.9    ? MCHC 06/09/2017 31.6    ? RDW 06/09/2017 14.2    ? Platelet Count 06/09/2017 140    ? MPV 06/09/2017 8.7    ? Neutrophils Absolute 06/09/2017 2.98    ? Neutrophils % 06/09/2017 70.3    ? Lymphocytes Absolute 06/09/2017 0.37    ? Lymphocytes % 06/09/2017 8.7*   ? Monocytes Absolute 06/09/2017 0.60    ? Monocytes % 06/09/2017 14.2    ? Eosinophils Absolute 06/09/2017 0.28    ? Eosinophils % 06/09/2017 6.6    ? Basophil Absolute 06/09/2017 0.01    ? BASOPHILS 06/09/2017 0.2    Hospital Outpatient Visit on 05/26/2017   Component Date Value   ? WBC 05/26/2017 3.57*   ? RBC 05/26/2017 3.22    ? Hemoglobin 05/26/2017 9.5*   ? Hematocrit 05/26/2017 30.2*   ? MCV 05/26/2017 93.8    ? [place] 05/26/2017 29.5    ? MCHC 05/26/2017 31.5    ? RDW 05/26/2017 13.8    ? Platelet Count 05/26/2017 143    ? MPV 05/26/2017 9.1    ? Neutrophils Absolute 05/26/2017 2.29    ? Neutrophils % 05/26/2017 64.1    ? Lymphocytes Absolute 05/26/2017 0.37    ? Lymphocytes % 05/26/2017 10.4*   ? Monocytes Absolute 05/26/2017 0.47    ? Monocytes % 05/26/2017 13.2    ? Eosinophils Absolute 05/26/2017 0.43    ? Eosinophils % 05/26/2017 12.0    ? Basophil Absolute 05/26/2017 0.01    ? BASOPHILS 05/26/2017 0.3    Hospital Outpatient Visit on 05/12/2017   Component Date Value   ? WBC 05/12/2017 3.99*

## 2017-07-07 NOTE — Progress Notes
?   Smoking status: Never Smoker   ? Smokeless tobacco: Never Used   ? Alcohol use 216.0 oz/week     18 Cans of beer per week   ? Drug use: No   ? Sexual activity: No     Other Topics Concern   ? Not on file     Social History Narrative   ? No narrative on file       Family History:    Family History   Problem Relation Age of Onset   ? Diabetes Mother    ? Cancer Father        Vital Signs:   Vitals:    07/07/17 0956   BP: 143/74   Pulse: 98   Resp: 16   Temp: 37.1 ?C (98.8 ?F)   Weight: 94.8 kg (209 lb 1.6 oz)   Height: 1.778 m (5\' 10" )       Allergies: No known drug allergy    Review of Systems:  Constitutional:?Weight loss  No fatigue  No loss of appetite  No night sweats  No fever  No chills?  Weight loss due to diuresis  Eyes:?No blurred vision  No double vision  No difficulty seeing  Ears,Nose, Mouth,Throat: No hearing loss  No ringing in ears  No sinus trouble  No difficulty swallowing  No sore throat  No nasal drainage  No nose bleeds  No hoarseness  Cardiac:No heart papitations  No lightheadedness  Swelling in legs  Respiratory:No cough  No sputum production  No hemoptysis  No shortness of breath  No orthopnea  Gastrointestinal:No nausea  No vomiting  No heartburn  No constipation  No diarrhea  No abdominal pain  No rectal bleeding  No Bowel incontinence  Genitourinary:No burning on urination  No pain with urination  No blood in urine  No frequent urination  No urinary incontinence  Musculosketal:No muscle pain  No stiffness  No joint pain  No joint swelling  No back pain  Skin:No skin rash  Neurological:No seizures  No dizziness  No loss of balance  No weakness of limbs  No loss of sensation  Tingling sensations bilateral hands  No memory loss  No difficulty talking  Psychiatric:No nervousness  No depression  No restlessness  No difficulty sleeping  Hematologic/Lymphatic/Immunologic:No bleeding  No lumps in arm pits  No lumps in neck  No lumps in   ?

## 2017-07-07 NOTE — Progress Notes
Coralee North, MD  [address] [place] [address]    Chief Complaint   Patient presents with   ? Follow-up     labs       Patient Name: Jeffery Gonzales    Date: 07/07/2017    Date Of Birth: 12/11/1940    History of Present Illness:  Mr. Mousseau is a 11?year old Caucasian male who was referred to the hematology office for an evaluation of anemia. The patient has undergone an evaluation by Dr. Carolina Cellar at [facility] due to iron deficiency anemia. The patient has a history of a small bowel GI bleed in 2013 and a second episode in February 2015 most recently occurring in the setting of Xarelto use. The patient has required several transfusions in the past. The patient denies any recent evidence of bleeding specifically no bright red blood per rectum or black stools. He underwent a colonoscopy exam 10/17/2013 which revealed a 3 mm sessile polyp in the descending colon which was removed, a 3 mm sessile polyp in the descending colon which was removed, a diminutive sessile polyp in the sigmoid colon which was removed, severe diverticulosis and otherwise a normal exam. An upper endoscopy obtained 10/17/2013 revealed patchy erythema in the antrum of the stomach but otherwise a normal exam. A capsule endoscopy obtained 10/18/2013 revealed a small erythematous spot in the proximal-mi jejunum without stigmata for bleeding. The remainder of the small bowel exam was normal. As there were no overt bleeding sites identified on the GI evaluation the patient was referred to the hematology office for additional assessment of the anemia and to evaluate for a possible myelodysplasia.  ??  The patient has a significant history of coronary artery disease, diabetes, congestive heart failure, hypertension, hyperlipidemia. He states that he fell and required hip surgery in March 2015.  ??  The patient received a dose of Injectafer 06/18/2014 but did not return the following week to receive cycle 2. The patient states that he had been

## 2017-07-07 NOTE — Progress Notes
?   RBC 05/12/2017 3.72    ? Hemoglobin 05/12/2017 11.0*   ? Hematocrit 05/12/2017 34.8*   ? MCV 05/12/2017 93.5    ? [facility] 05/12/2017 29.6    ? MCHC 05/12/2017 31.6    ? RDW 05/12/2017 14.5    ? Platelet Count 05/12/2017 126*   ? MPV 05/12/2017 9.6    ? Neutrophils Absolute 05/12/2017 2.84    ? Neutrophils % 05/12/2017 71.1    ? Lymphocytes Absolute 05/12/2017 0.39    ? Lymphocytes % 05/12/2017 9.8*   ? Monocytes Absolute 05/12/2017 0.45    ? Monocytes % 05/12/2017 11.3    ? Eosinophils Absolute 05/12/2017 0.30    ? Eosinophils % 05/12/2017 7.5    ? Basophil Absolute 05/12/2017 0.01    ? BASOPHILS 05/12/2017 0.3          Imaging Results:     Health Maintenance: Colonoscopy:2015    Physical Exam:   General: Well developed, well nourished, No acute Distress, Gentleman patient and Elderly appearance  Head:Atraumatic and normocephalic  Eyes:PERRLA, EOMS intact, No jaundice, pallor and Conjunctiva clear  Ears, Nose, Throat, Neck, and Mouth: Trachea Midline, No JVD, No Lymphadenopathy, Thyroid midline-normal, Neck supple and No oral lesions  Cardiovascular: Atrial fibrillation  Chest:Symmetrical, No kyphosis and No scoliosis  Respiratory:Clear, No rales/Rhonchi  Gastrointestinal:Abdomen soft, Abdomen non-tender, Abdomen non-distended, Abdomen without masses, No ascites and No hepatosplenomegaly  Hematologic/Lymphatic:No petechiae, No purpura, No neck lymphadenopathy, No Axillary lymphadenopathy and No Groin lymphadenopathy  Musculoskeletal: Normal gait, normal tone  Extremities: 1+ edema, No cyanosis, No digital clubbing and No discoloration  Neurologic:Alert and oriented, No Hemiparesis and No motor deficits    Assessment:  Jeffery Gonzales is a 33?year old male who was referred to the hematology office for an evaluation of anemia. 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 The patient completed a GI evaluation which did not reveal a specific source of bleeding. However, I

## 2017-07-07 NOTE — Patient Instructions
Hold Procrit  Injectafer weekly x 2 doses-start next week  RTC 5 weeks   Labs 3 days PTV-CBC, Ferritin, and iron profile

## 2017-07-07 NOTE — Progress Notes
suffering from persistent pain in the hip and is being evaluated by orthopedics and pain management. The patient received dose 2 of Injectafer in February 2016, July 2016, Septmeber 2016 and February 2017.   ??  The patient underwent a bone marrow biopsy and aspirate 04/28/15 to evaluate for possible MDS. This revealed a hyperplastic marrow (65% cellularity) with mild myeloid hyperplasia and erythroid hyperplasia. Flow cytometry showed no evidence of acute leukemia, B-cell lymphproliferative process or T-cell aberrancy. 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 FISH analysis: Normal results seen for 3 every week, 5, 7, 8, 11(MLL), 12(ETV6), 17, 19, 20. This was interpreted as a normal result. In the absence of a cytogenetic/fish abnormality, the changes are likely reactive. Clinical correlation and followup are recommended.  ??  The patient received a transfusion in November 2016 and has received several doses of intravenous iron. The patient states that he has elected not to return to the GI physicians as he is unwilling to undergo any additional GI evaluations.  ?  The Procrit was held due to a reduction in the iron indices. The patient completed a course of parenteral iron infusions using Injectafer in August 2018 and subsequently resumed Procrit therapy. The patient returns today for the scheduled appointment. The patient denies any new medical issues and voices no new complaints. The patient denies symptoms of shortness of breath, chest pain, abdominal pain, bleeding including bright red blood per rectum or black stools.  Past Surgical History:   Past Surgical History:   Procedure Laterality Date   ? APPENDECTOMY     ? CORONARY ANGIOPLASTY WITH STENT PLACEMENT      x 2       Social History:    Social History     Social History   ? Marital status: Divorced     Spouse name: N/A   ? Number of children: N/A   ? Years of education: N/A     Occupational History   ? Not on file.     Social History Main Topics

## 2017-07-07 NOTE — Progress Notes
This document was created using voice recognition software. Inadvertent typographical errors, word omissions and/or word substitutions may exist.

## 2017-07-07 NOTE — Progress Notes
suspected that the patient likely had chronic GI blood loss resulting in the anemia. The patient has a history of prior GI blood loss which was exacerbated by Xarelto. The patient was on anticoagulation due to the congestive heart failure and atrial fibrillation however this has since been discontinued. The patient remains?on aspirin therapy alone.  ??  There were no findings of B12 deficiency, folate deficiency, or hemolysis. The bone marrow biopsy did not show evidence of leukemia, lymphoma, myelodysplasia, or myelofibrosis. The changes noted in the bone marrow were felt consistent with a reactive process.     I have reviewed the laboratory results with the patient. We will again need to hold Procrit therapy due to decreased iron indices. We had discussed proceeding with parenteral iron using Injectafer 750 mg IV weekly x 2 doses. I have again explained that there is a rare but possible risk of a severe allergic reaction with each dose of intravenous iron. The patient verbalized an understanding and is willing to proceed. We have discussed having the patient return to the gastroenterologist to assess for possible occult sources of GI blood loss. The patient has again refused to return to the gastroenterologist stating that he is unwilling to proceed with any further GI investigation.    Recommendation/Plan:   1. We will hold Procrit for now.  2. We will arrange for the patient to receive Injectafer 750 mg IV weekly ?2 doses beginning next week.  3. The patient will return for an exam in 5 weeks obtaining a CBC, ferritin, iron profile shortly prior to the return visit.  4. If the iron indices are adequate at that time, then we will resume Procrit 20,000 units subcu every 2 weeks.  5. As always, Jeffery Gonzales understands that he may return to the office sooner than the scheduled appointment if any questions or concerns arise during the interim.    Return to Clinic: Return in about 5 weeks (around 08/11/2017).

## 2017-07-11 MED FILL — LISINOPRIL 20 MG TABLET: 20 | 90 days supply | Qty: 90 | Fill #2

## 2017-07-14 ENCOUNTER — Inpatient Hospital Stay: Admit: 2017-07-14 | Discharge: 2017-07-15 | Primary: Internal Medicine

## 2017-07-14 DIAGNOSIS — Z79899 Other long term (current) drug therapy: Secondary | ICD-10-CM

## 2017-07-14 DIAGNOSIS — I130 Hypertensive heart and chronic kidney disease with heart failure and stage 1 through stage 4 chronic kidney disease, or unspecified chronic kidney disease: Principal | ICD-10-CM

## 2017-07-14 DIAGNOSIS — D631 Anemia in chronic kidney disease: Secondary | ICD-10-CM

## 2017-07-14 DIAGNOSIS — N184 Chronic kidney disease, stage 4 (severe): Secondary | ICD-10-CM

## 2017-07-14 DIAGNOSIS — E611 Iron deficiency: Secondary | ICD-10-CM

## 2017-07-14 DIAGNOSIS — E1122 Type 2 diabetes mellitus with diabetic chronic kidney disease: Secondary | ICD-10-CM

## 2017-07-14 DIAGNOSIS — I509 Heart failure, unspecified: Secondary | ICD-10-CM

## 2017-07-14 MED ORDER — EPINEPHRINE 0.3 MG/0.3ML IJ SOAJ
0.3 mg | INTRAMUSCULAR | Status: CN | PRN
Start: 2017-07-14 — End: ?

## 2017-07-14 MED ORDER — SODIUM CHLORIDE FLUSH 0.9 % IV SOLN
10 mL | Status: CN | PRN
Start: 2017-07-14 — End: ?

## 2017-07-14 MED ORDER — SODIUM CHLORIDE 0.9 % IV SOLN
Freq: Once | INTRAVENOUS | Status: CN
Start: 2017-07-14 — End: ?

## 2017-07-14 MED ORDER — SODIUM CHLORIDE FLUSH 0.9 % IV SOLN
10 mL | Status: DC | PRN
Start: 2017-07-14 — End: 2017-07-15

## 2017-07-14 MED ORDER — FERRIC CARBOXYMALTOSE IVPB
750 mg | Freq: Once | INTRAVENOUS | Status: CN
Start: 2017-07-14 — End: ?

## 2017-07-14 MED ORDER — DEXAMETHASONE SODIUM PHOSPHATE 20 MG/5ML IJ SOLN
20 mg | INTRAVENOUS | Status: CN | PRN
Start: 2017-07-14 — End: ?

## 2017-07-14 MED ORDER — HEPARIN SODIUM LOCK FLUSH 100 UNIT/ML IV SOLN
500 [IU] | Status: CN | PRN
Start: 2017-07-14 — End: ?

## 2017-07-14 MED ORDER — DIPHENHYDRAMINE HCL 50 MG/ML IJ SOLN
25 mg | INTRAVENOUS | Status: CN | PRN
Start: 2017-07-14 — End: ?

## 2017-07-14 MED ORDER — FERRIC CARBOXYMALTOSE IVPB
750 mg | Freq: Once | INTRAVENOUS | Status: DC
Start: 2017-07-14 — End: 2017-07-15

## 2017-07-14 MED ORDER — MEPERIDINE HCL 25 MG/ML IJ SOLN
25 mg | INTRAVENOUS | Status: CN | PRN
Start: 2017-07-14 — End: ?

## 2017-07-14 MED ORDER — HYDROCORTISONE NA SUCCINATE PF 100 MG IJ SOLR
100 mg | INTRAVENOUS | Status: CN | PRN
Start: 2017-07-14 — End: ?

## 2017-07-14 NOTE — Progress Notes
Patient tolerated treatment well and voiced no complaints.  Patient went to checkout to schedule future appointments.  Patient was ambulatory when he left the treatment area

## 2017-07-14 NOTE — Progress Notes
Patient was re-reducated on their medications.  Patient verbalized understanding, and all questions were answered.  Patient knows that they can ask questions at anytime should they have any.

## 2017-07-21 ENCOUNTER — Inpatient Hospital Stay: Admit: 2017-07-21 | Discharge: 2017-07-22 | Primary: Internal Medicine

## 2017-07-21 DIAGNOSIS — D631 Anemia in chronic kidney disease: Secondary | ICD-10-CM

## 2017-07-21 DIAGNOSIS — I509 Heart failure, unspecified: Secondary | ICD-10-CM

## 2017-07-21 DIAGNOSIS — N184 Chronic kidney disease, stage 4 (severe): Secondary | ICD-10-CM

## 2017-07-21 DIAGNOSIS — E1122 Type 2 diabetes mellitus with diabetic chronic kidney disease: Secondary | ICD-10-CM

## 2017-07-21 DIAGNOSIS — E611 Iron deficiency: Secondary | ICD-10-CM

## 2017-07-21 DIAGNOSIS — I130 Hypertensive heart and chronic kidney disease with heart failure and stage 1 through stage 4 chronic kidney disease, or unspecified chronic kidney disease: Principal | ICD-10-CM

## 2017-07-21 MED ORDER — DIPHENHYDRAMINE HCL 50 MG/ML IJ SOLN
25 mg | INTRAVENOUS | Status: CN | PRN
Start: 2017-07-21 — End: ?

## 2017-07-21 MED ORDER — SODIUM CHLORIDE FLUSH 0.9 % IV SOLN
10 mL | Status: CN | PRN
Start: 2017-07-21 — End: ?

## 2017-07-21 MED ORDER — FERRIC CARBOXYMALTOSE IVPB
750 mg | Freq: Once | INTRAVENOUS | Status: CN
Start: 2017-07-21 — End: ?

## 2017-07-21 MED ORDER — MEPERIDINE HCL 25 MG/ML IJ SOLN
25 mg | INTRAVENOUS | Status: CN | PRN
Start: 2017-07-21 — End: ?

## 2017-07-21 MED ORDER — SODIUM CHLORIDE 0.9 % IV SOLN
Freq: Once | INTRAVENOUS | Status: CP
Start: 2017-07-21 — End: ?

## 2017-07-21 MED ORDER — HYDROCORTISONE NA SUCCINATE PF 100 MG IJ SOLR
100 mg | INTRAVENOUS | Status: CN | PRN
Start: 2017-07-21 — End: ?

## 2017-07-21 MED ORDER — FERRIC CARBOXYMALTOSE IVPB
750 mg | Freq: Once | INTRAVENOUS | Status: DC
Start: 2017-07-21 — End: 2017-07-22

## 2017-07-21 MED ORDER — EPINEPHRINE 0.3 MG/0.3ML IJ SOAJ
0.3 mg | INTRAMUSCULAR | Status: CN | PRN
Start: 2017-07-21 — End: ?

## 2017-07-21 MED ORDER — SODIUM CHLORIDE 0.9 % IV SOLN
Freq: Once | INTRAVENOUS | Status: CN
Start: 2017-07-21 — End: ?

## 2017-07-21 MED ORDER — HEPARIN SODIUM LOCK FLUSH 100 UNIT/ML IV SOLN
500 [IU] | Status: CN | PRN
Start: 2017-07-21 — End: ?

## 2017-07-21 MED ORDER — DEXAMETHASONE SODIUM PHOSPHATE 20 MG/5ML IJ SOLN
20 mg | INTRAVENOUS | Status: CN | PRN
Start: 2017-07-21 — End: ?

## 2017-07-21 NOTE — Progress Notes
Pt. Tolerated treatment well. No C/O verbalized. Pt. D/C home walking in stable condition.

## 2017-07-25 MED FILL — LISINOPRIL 20 MG TABLET: 20 | 30 days supply | Qty: 30 | Fill #3

## 2017-07-29 ENCOUNTER — Encounter: Payer: Self-pay | Admitting: Family Medicine

## 2017-08-01 MED FILL — carBAMazepine ER 100 MG TB1: 100 | 30 days supply | Qty: 60 | Fill #5

## 2017-08-01 MED FILL — TAMSULOSIN HCL 0.4 MG CAP: 0.4 | 90 days supply | Qty: 90 | Fill #0

## 2017-08-09 ENCOUNTER — Inpatient Hospital Stay: Admit: 2017-08-09 | Discharge: 2017-08-10 | Primary: Internal Medicine

## 2017-08-09 DIAGNOSIS — E611 Iron deficiency: Secondary | ICD-10-CM

## 2017-08-09 DIAGNOSIS — N184 Chronic kidney disease, stage 4 (severe): Secondary | ICD-10-CM

## 2017-08-09 DIAGNOSIS — D631 Anemia in chronic kidney disease: Secondary | ICD-10-CM

## 2017-08-12 ENCOUNTER — Telehealth: Payer: Self-pay | Admitting: Medical

## 2017-08-12 ENCOUNTER — Encounter: Payer: Self-pay | Admitting: Medical

## 2017-08-12 ENCOUNTER — Ambulatory Visit (INDEPENDENT_AMBULATORY_CARE_PROVIDER_SITE_OTHER): Payer: PPO | Admitting: Medical

## 2017-08-12 ENCOUNTER — Ambulatory Visit: Admit: 2017-08-12 | Discharge: 2017-08-12 | Attending: Hematology & Oncology | Primary: Internal Medicine

## 2017-08-12 VITALS — BP 138/61 | HR 76 | Temp 98.1°F | Resp 16 | Ht 71.0 in | Wt 234.0 lb

## 2017-08-12 DIAGNOSIS — N184 Chronic kidney disease, stage 4 (severe): Secondary | ICD-10-CM

## 2017-08-12 DIAGNOSIS — I509 Heart failure, unspecified: Principal | ICD-10-CM

## 2017-08-12 DIAGNOSIS — I10 Essential (primary) hypertension: Secondary | ICD-10-CM

## 2017-08-12 DIAGNOSIS — D509 Iron deficiency anemia, unspecified: Principal | ICD-10-CM

## 2017-08-12 DIAGNOSIS — I639 Cerebral infarction, unspecified: Secondary | ICD-10-CM

## 2017-08-12 DIAGNOSIS — K5730 Diverticulosis of large intestine without perforation or abscess without bleeding: Secondary | ICD-10-CM

## 2017-08-12 DIAGNOSIS — Z86010 Personal history of colonic polyps: Secondary | ICD-10-CM

## 2017-08-12 DIAGNOSIS — D631 Anemia in chronic kidney disease: Secondary | ICD-10-CM

## 2017-08-12 DIAGNOSIS — E611 Iron deficiency: Secondary | ICD-10-CM

## 2017-08-12 DIAGNOSIS — F1010 Alcohol abuse, uncomplicated: Secondary | ICD-10-CM

## 2017-08-12 DIAGNOSIS — E785 Hyperlipidemia, unspecified: Secondary | ICD-10-CM

## 2017-08-12 DIAGNOSIS — E119 Type 2 diabetes mellitus without complications: Secondary | ICD-10-CM

## 2017-08-12 DIAGNOSIS — I2510 Atherosclerotic heart disease of native coronary artery without angina pectoris: Secondary | ICD-10-CM

## 2017-08-12 DIAGNOSIS — I4891 Unspecified atrial fibrillation: Secondary | ICD-10-CM

## 2017-08-12 DIAGNOSIS — Z955 Presence of coronary angioplasty implant and graft: Secondary | ICD-10-CM

## 2017-08-12 DIAGNOSIS — I110 Hypertensive heart disease with heart failure: Secondary | ICD-10-CM

## 2017-08-12 DIAGNOSIS — I6350 Cerebral infarction due to unspecified occlusion or stenosis of unspecified cerebral artery: Secondary | ICD-10-CM

## 2017-08-12 DIAGNOSIS — J0100 Acute maxillary sinusitis, unspecified: Secondary | ICD-10-CM | POA: Diagnosis not present

## 2017-08-12 DIAGNOSIS — M791 Myalgia, unspecified site: Secondary | ICD-10-CM

## 2017-08-12 DIAGNOSIS — J029 Acute pharyngitis, unspecified: Secondary | ICD-10-CM

## 2017-08-12 DIAGNOSIS — R059 Cough, unspecified: Secondary | ICD-10-CM

## 2017-08-12 DIAGNOSIS — J40 Bronchitis, not specified as acute or chronic: Secondary | ICD-10-CM | POA: Diagnosis not present

## 2017-08-12 DIAGNOSIS — R05 Cough: Secondary | ICD-10-CM | POA: Diagnosis not present

## 2017-08-12 LAB — POC INFLUENZA A&B (BINAX/QUICKVUE)
INFLUENZA A, POC: NEGATIVE
Influenza B, POC: NEGATIVE

## 2017-08-12 MED ORDER — HYDROCODONE-HOMATROPINE 5-1.5 MG/5ML PO SYRP: 5.0000 mL | ORAL_SOLUTION | Freq: Four times a day (QID) | ORAL | 0 refills | Status: DC | PRN

## 2017-08-12 MED ORDER — DOXYCYCLINE HYCLATE 100 MG PO TABS: 100.0000 mg | ORAL_TABLET | Freq: Two times a day (BID) | ORAL | 0 refills | Status: DC

## 2017-08-12 MED ORDER — FUROSEMIDE 40 MG PO TABS
40 mg | ORAL
Start: 2017-08-12 — End: ?

## 2017-08-12 MED ORDER — OXYCODONE-ACETAMINOPHEN 10-325 MG PO TABS
ORAL
Start: 2017-08-12 — End: ?

## 2017-08-12 MED FILL — DOXYCYCLINE HYCLATE 100 MG: 100 | 10 days supply | Qty: 20 | Fill #0

## 2017-08-12 MED FILL — HYDROCODONE-HOMATROPINE SOL: 5-1.5 | 5 days supply | Qty: 100 | Fill #0

## 2017-08-12 NOTE — Patient Instructions
RTC 3 weeks  Labs 3 days PTV-CBC and Iron profile

## 2017-08-12 NOTE — Progress Notes
?   Lymphocytes % 07/07/2017 8.6*   ? Monocytes Absolute 07/07/2017 0.55    ? Monocytes % 07/07/2017 15.3    ? Eosinophils Absolute 07/07/2017 0.18    ? Eosinophils % 07/07/2017 5.0    ? Basophil Absolute 07/07/2017 0.01    ? BASOPHILS 07/07/2017 0.3    Hospital Outpatient Visit on 06/23/2017   Component Date Value   ? Sodium 06/23/2017 137    ? Potassium 06/23/2017 3.8    ? Chloride 06/23/2017 97*   ? CO2 06/23/2017 30*   ? Urea Nitrogen 06/23/2017 20    ? Creatinine 06/23/2017 0.94    ? BUN/Creatinine Ratio 06/23/2017 21.3    ? Glucose 06/23/2017 163*   ? Calcium 06/23/2017 8.8    ? Anion Gap 06/23/2017 10    ? EGFR 06/23/2017 >59    ? Ferritin 06/23/2017 92.1    ? Iron 06/23/2017 31*   ? Transferrin 06/23/2017 168*   ? TIBC 06/23/2017 213*   ? Iron Saturation 06/23/2017 15*   ? WBC 06/23/2017 4.04*   ? RBC 06/23/2017 3.28    ? Hemoglobin 06/23/2017 9.6*   ? Hematocrit 06/23/2017 30.4*   ? MCV 06/23/2017 92.7    ? [place] 06/23/2017 29.3    ? MCHC 06/23/2017 31.6    ? RDW 06/23/2017 14.1    ? Platelet Count 06/23/2017 172    ? MPV 06/23/2017 9.7    ? Neutrophils Absolute 06/23/2017 3.06    ? Neutrophils % 06/23/2017 75.8    ? Lymphocytes Absolute 06/23/2017 0.34    ? Lymphocytes % 06/23/2017 8.4*   ? Monocytes Absolute 06/23/2017 0.46    ? Monocytes % 06/23/2017 11.4    ? Eosinophils Absolute 06/23/2017 0.17    ? Eosinophils % 06/23/2017 4.2    ? Basophil Absolute 06/23/2017 0.01    ? BASOPHILS 06/23/2017 0.2          Imaging Results:     Health Maintenance: Colonoscopy:2015    Physical Exam:   General: Well developed, well nourished, No acute Distress, Gentleman patient and Elderly appearance  Head:Atraumatic and normocephalic  Eyes:PERRLA, EOMS intact, No jaundice, pallor and Conjunctiva clear  Ears, Nose, Throat, Neck, and Mouth: Trachea Midline, No JVD, No Lymphadenopathy, Thyroid midline-normal, Neck supple and No oral lesions  Cardiovascular: Atrial fibrillation  Chest:Symmetrical, No kyphosis and No scoliosis

## 2017-08-12 NOTE — Progress Notes
Respiratory:Clear, No rales/Rhonchi  Gastrointestinal:Abdomen soft, Abdomen non-tender, Abdomen non-distended, Abdomen without masses, No ascites and No hepatosplenomegaly  Hematologic/Lymphatic:No petechiae, No purpura, No neck lymphadenopathy, No Axillary lymphadenopathy and No Groin lymphadenopathy  Musculoskeletal: Normal gait, normal strength/tone  Extremities: 1+ edema, No cyanosis, No digital clubbing and No discoloration, no bruising or skin discoloration involving the right shin  Neurologic:Alert and oriented, No Hemiparesis and No motor deficits    Assessment:  Jeffery Gonzales is a 25?year old male who was referred to the hematology office for an evaluation of anemia. 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 The patient completed a GI evaluation which did not reveal a specific source of bleeding. However, I suspected?that the patient likely had chronic GI blood loss resulting in the anemia. The patient has a history of prior GI blood loss which was exacerbated by Xarelto. The patient was on anticoagulation due to the congestive heart failure and atrial fibrillation however this has since been discontinued. The patient remains?on aspirin therapy alone.  ??  There were no findings of B12 deficiency, folate deficiency, or hemolysis. The bone marrow biopsy did not show evidence of leukemia, lymphoma, myelodysplasia, or myelofibrosis. The changes noted in the bone marrow were felt consistent with a reactive process.   ?  I have reviewed the laboratory results with the patient. Procrit was held due to a reduction in the iron indices. The patient completed a course of parenteral iron using Injectafer with an improvement in the hematologic counts and iron indices. However, Procrit will need to remain on hold as the iron saturation remains less than 20.    Recommendation/Plan:   1. We will continue to hold Procrit for now.

## 2017-08-12 NOTE — Progress Notes
2. The patient was instructed to take ferrous sulfate 325 mg by mouth twice a day.  3. The patient will return for an exam in 3 weeks obtaining a CBC and iron profile shortly prior to the return visit. If the iron saturation is 20 or greater, then we will resume Procrit 20,000 units subcu every 2 weeks.  4. As always, Jeffery Gonzales understands that he may return to the office sooner than the scheduled appointment if any questions or concerns arise during the interim.    Return to Clinic: Return in about 3 weeks (around 09/02/2017).      This document was created using voice recognition software. Inadvertent typographical errors, word omissions and/or word substitutions may exist.

## 2017-08-12 NOTE — Progress Notes
Jeffery North, MD  [address] [place] [place] [address]    Chief Complaint   Patient presents with   ? Follow-up     labs       Patient Name: Jeffery Gonzales    Date: 08/12/2017    Date Of Birth: Apr 30, 1941    History of Present Illness:  Jeffery Gonzales is a 75?year old Caucasian male who was referred to the hematology office for an evaluation of anemia. The patient has undergone an evaluation by Dr. Carolina Gonzales at [facility] due to iron deficiency anemia. The patient has a history of a small bowel GI bleed in 2013 and a second episode in February 2015 most recently occurring in the setting of Xarelto use. The patient has required several transfusions in the past. The patient denies any recent evidence of bleeding specifically no bright red blood per rectum or black stools. He underwent a colonoscopy exam 10/17/2013 which revealed a 3 mm sessile polyp in the descending colon which was removed, a 3 mm sessile polyp in the descending colon which was removed, a diminutive sessile polyp in the sigmoid colon which was removed, severe diverticulosis and otherwise a normal exam. An upper endoscopy obtained 10/17/2013 revealed patchy erythema in the antrum of the stomach but otherwise a normal exam. A capsule endoscopy obtained 10/18/2013 revealed a small erythematous spot in the proximal-mi jejunum without stigmata for bleeding. The remainder of the small bowel exam was normal. As there were no overt bleeding sites identified on the GI evaluation the patient was referred to the hematology office for additional assessment of the anemia and to evaluate for a possible myelodysplasia.  ??  The patient has a significant history of coronary artery disease, diabetes, congestive heart failure, hypertension, hyperlipidemia. He states that he fell and required hip surgery in March 2015.  ??  The patient received a dose of Injectafer 06/18/2014 but did not return the following week to receive cycle 2. The patient states that he had been

## 2017-08-12 NOTE — Progress Notes
suffering from persistent pain in the hip and is being evaluated by orthopedics and pain management. The patient received dose 2 of Injectafer in February 2016, July 2016, Septmeber 2016 and February 2017.   ??  The patient underwent a bone marrow biopsy and aspirate 04/28/15 to evaluate for possible MDS. This revealed a hyperplastic marrow (65% cellularity) with mild myeloid hyperplasia and erythroid hyperplasia. Flow cytometry showed no evidence of acute leukemia, B-cell lymphproliferative process or T-cell aberrancy. Cytogenetics: 46 XY karyotype. FISH analysis: Normal results seen for 3 every week, 5, 7, 8, 11(MLL), 12(ETV6), 17, 19, 20. This was interpreted as a normal result. In the absence of a cytogenetic/fish abnormality, the changes are likely reactive. Clinical correlation and followup are recommended.  ??  The patient received a transfusion in November 2016 and has received several doses of intravenous iron. The patient states that he has elected not to return to the GI physicians as he is unwilling to undergo any additional GI evaluations.  ?  The patient returns for the scheduled appointment. The patient completed a course of Injectafer in January 2019. Procrit was held due to decreased iron indices. The patient informs me that he tripped and fell 2 weeks ago while getting out of his recliner and injured the right shin. The patient did not seek medical attention. The patient describes mild tenderness which has improved. The patient denies symptoms of shortness of breath, chest pain, bleeding including bright red blood per rectum or black stools.    Past Surgical History:   Past Surgical History:   Procedure Laterality Date   ? APPENDECTOMY     ? CORONARY ANGIOPLASTY WITH STENT PLACEMENT      x 2       Social History:    Social History     Social History   ? Marital status: Divorced     Spouse name: N/A   ? Number of children: N/A   ? Years of education: N/A     Occupational History   ? Not on file.

## 2017-08-12 NOTE — Progress Notes
The past/family/social history and review of systems remains accurate as of today's date 08/12/2017    Medications:   Current Outpatient Prescriptions:   ?  atorvastatin (LIPITOR) 40 MG PO Tablet, Take 40 mg by mouth daily., Disp: , Rfl:   ?  diclofenac (VOLTAREN) 1 % TD Gel, Apply 4 g topically as needed for pain., Disp: , Rfl:   ?  docusate calcium (SURFAK) 240 MG PO Capsule, Take 240 mg by mouth 2 times daily as needed for constipation., Disp: , Rfl:   ?  furosemide (LASIX) 40 MG PO Tablet, Take 40 mg by mouth., Disp: , Rfl:   ?  oxyCODONE-acetaminophen (PERCOCET) 10-325 MG PO Tablet, by mouth., Disp: , Rfl:       Labs:   Hospital Outpatient Visit on 08/09/2017   Component Date Value   ? Ferritin 08/09/2017 450.1*   ? Iron 08/09/2017 32    ? Transferrin 08/09/2017 137*   ? TIBC 08/09/2017 174*   ? Iron Saturation 08/09/2017 18*   ? WBC 08/09/2017 4.32*   ? RBC 08/09/2017 3.54    ? Hemoglobin 08/09/2017 9.7*   ? Hematocrit 08/09/2017 31.8*   ? MCV 08/09/2017 89.8    ? [facility] 08/09/2017 27.4    ? MCHC 08/09/2017 30.5    ? RDW 08/09/2017 18.6    ? Platelet Count 08/09/2017 161    ? MPV 08/09/2017 10.1    ? Neutrophils Absolute 08/09/2017 3.16    ? Neutrophils % 08/09/2017 73.1    ? Lymphocytes Absolute 08/09/2017 0.37    ? Lymphocytes % 08/09/2017 8.6*   ? Monocytes Absolute 08/09/2017 0.61    ? Monocytes % 08/09/2017 14.1    ? Eosinophils Absolute 08/09/2017 0.16    ? Eosinophils % 08/09/2017 3.7    ? Basophil Absolute 08/09/2017 0.02    ? BASOPHILS 08/09/2017 0.5    Hospital Outpatient Visit on 07/07/2017   Component Date Value   ? WBC 07/07/2017 3.59*   ? RBC 07/07/2017 3.13    ? Hemoglobin 07/07/2017 8.7*   ? Hematocrit 07/07/2017 27.9*   ? MCV 07/07/2017 89.1    ? [facility] 07/07/2017 27.8    ? MCHC 07/07/2017 31.2    ? RDW 07/07/2017 15.1    ? Platelet Count 07/07/2017 158    ? MPV 07/07/2017 9.4    ? Neutrophils Absolute 07/07/2017 2.54    ? Neutrophils % 07/07/2017 70.8    ? Lymphocytes Absolute 07/07/2017 0.31

## 2017-08-12 NOTE — Addendum Note
Addended by: Wonda Cheng on: 08/12/2017 11:59 AM     Modules accepted: Orders

## 2017-08-12 NOTE — Progress Notes
Social History Main Topics   ? Smoking status: Never Smoker   ? Smokeless tobacco: Never Used   ? Alcohol use 216.0 oz/week     18 Cans of beer per week   ? Drug use: No   ? Sexual activity: No     Other Topics Concern   ? Not on file     Social History Narrative   ? No narrative on file       Family History:    Family History   Problem Relation Age of Onset   ? Diabetes Mother    ? Cancer Father        Vital Signs:   Vitals:    08/12/17 1125   BP: 129/65   Pulse: 70   Resp: 16   Temp: 37.5 ?C (99.5 ?F)   Weight: 92.5 kg (203 lb 14.4 oz)   Height: 1.778 m (5\' 10" )       Allergies: No known drug allergy    Review of Systems:  Constitutional:?Weight loss: Due to diuresis  No fatigue  No loss of appetite  No night sweats  No fever  No chills?  Eyes:?No blurred vision  No double vision  No difficulty seeing  Ears,Nose, Mouth,Throat: No hearing loss  No ringing in ears  No sinus trouble  No difficulty swallowing  No sore throat  No nasal drainage  No nose bleeds  No hoarseness  Cardiac:No heart papitations  No lightheadedness  Swelling in legs: Improved  Respiratory:No cough  No sputum production  No hemoptysis  No shortness of breath  No orthopnea  Gastrointestinal:No nausea  No vomiting  No heartburn  No constipation  No diarrhea  No abdominal pain  No rectal bleeding  No Bowel incontinence  Genitourinary:No burning on urination  No pain with urination  No blood in urine  No frequent urination  No urinary incontinence  Musculosketal:No muscle pain  No stiffness  No joint pain  No joint swelling  No back pain  Skin:No skin rash  Neurological:No seizures  No dizziness  No loss of balance  No weakness of limbs  No loss of sensation  Tingling sensations bilateral hands  No memory loss  No difficulty talking  Psychiatric:No nervousness  No depression  No restlessness  No difficulty sleeping  Hematologic/Lymphatic/Immunologic:No bleeding  No lumps in arm pits  No lumps in neck  No lumps in   ?

## 2017-08-12 NOTE — Progress Notes (Signed)
Subjective:    Patient ID: William [name], male    DOB: Oct 22, 1940, 77 y.o.   MRN: [id number]  HPI  Pt in for some sinus pressure and runny nose for 5 days. States has sinus pain and some cough. Wife states it sounds like has some mucus to brings. Some body aches on Monday.  Pt did get flu vaccine this year.   Review of Systems  Constitutional: Negative for chills, fatigue and fever.  HENT: Positive for congestion, sinus pressure, sinus pain and sore throat. Negative for drooling, facial swelling, hearing loss, postnasal drip, rhinorrhea and sneezing.        Faint sore throat.  Respiratory: Positive for cough. Negative for chest tightness, shortness of breath and wheezing.   Cardiovascular: Negative for chest pain and palpitations.  Gastrointestinal: Negative for abdominal pain.  Musculoskeletal: Positive for myalgias. Negative for back pain, gait problem and neck stiffness.       Myalgia moderate on Monday. Some still present today.  Skin: Negative for rash.  Neurological: Negative for dizziness, syncope, weakness, numbness and headaches.  Hematological: Negative for adenopathy. Does not bruise/bleed easily.  Psychiatric/Behavioral: Negative for behavioral problems, confusion, sleep disturbance and suicidal ideas. The patient is not nervous/anxious.    Past Medical History:  Diagnosis Date  . ANXIETY   . Arthritis    low back  . Bladder stone   . BPH (benign prostatic hypertrophy)   . Carotid artery occlusion   . 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 CVD (cerebrovascular disease)   . Eczema   . GERD (gastroesophageal reflux disease)   . History of carotid artery stenosis    S/P BILATERAL CEA  . 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 Hyperlipidemia   . Hypertension   . Nocturia   . S/P carotid endarterectomy    BILATERAL ICA--  PATENT PER DUPLEX  05-19-2012  . Stroke [facility]) 1996   pt states no weaknes but has severe anxiety   . Urinary frequency   . Vitamin D deficiency      Social History   Socioeconomic History  . Marital status: Married    Spouse name: Not on file  . Number of children: 2  . Years of education: Not on file  . Highest education level: Not on file  Social Needs  . Financial resource strain: Not on file  . Food insecurity - worry: Not on file  . Food insecurity - inability: Not on file  . Transportation needs - medical: Not on file  . Transportation needs - non-medical: Not on file  Occupational History    Employer: Retired  Tobacco Use  . Smoking status: Former Smoker    Packs/day: 2.00    Years: 40.00    Pack years: 80.00    Types: Cigarettes    Last attempt to quit: 02/15/2005    Years since quitting: 12.4  . Smokeless tobacco: Never Used  Substance and Sexual Activity  . Alcohol use: Yes    Alcohol/week: 0.0 oz    Comment: Occasional  . Drug use: No  . Sexual activity: Yes    Partners: Female  Other Topics Concern  . Not on file  Social History Narrative   Exercise--  Walks dogs everyday    Past Surgical History:  Procedure Laterality Date  . APPENDECTOMY  AS CHILD  . CARDIOVASCULAR STRESS TEST  03-27-2012  DR CRENSHAW   LOW RISK LEXISCAN STUDY-- PROBABLE  NORMAL PERFUSION AND SOFT TISSUE ATTENUATION/  NO ISCHEMIA/ EF 51%  . CAROTID ENDARTERECTOMY Bilateral LEFT  11-12-2008  DR GREG HAYES   RIGHT ICA  2006  ([facility])  . CYSTOSCOPY WITH LITHOLAPAXY N/A 02/26/2013   Procedure: CYSTOSCOPY WITH LITHOLAPAXY;  Surgeon: Franchot Gallo, MD;  [facility];  Service: Urology;  Laterality: N/A;  . EYE SURGERY  Jan. 2016   cataract surgery both eyes  . INGUINAL HERNIA REPAIR Right 11-08-2006  . MASS EXCISION N/A 03/03/2016   Procedure: EXCISION OF BACK  MASS;  Surgeon: Stark Klein, MD;  Location: [facility];  Service: General;  Laterality: N/A;  . 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 PROSTATE SURGERY    .  TRANSURETHRAL RESECTION OF PROSTATE N/A 02/26/2013   Procedure: TRANSURETHRAL RESECTION OF THE PROSTATE WITH GYRUS INSTRUMENTS;  Surgeon: Franchot Gallo, MD;  Location: [facility];  Service: Urology;  Laterality: N/A;    Family History  Problem Relation Age of Onset  . Heart disease Mother        CHF  . Bipolar disorder Mother   . Heart disease Father        CHF    Allergies  Allergen Reactions  . Adhesive [Tape] Other (See Comments)    BLISTER  . Statins Other (See Comments)    myalgias  . Strawberry (Diagnostic) Hives    Current Outpatient Medications on File Prior to Visit  Medication Sig Dispense Refill  . aspirin 325 MG EC tablet Take 325 mg by mouth daily.    . B Complex Vitamins (VITAMIN B COMPLEX PO) Take by mouth daily.      . Calcium Carb-Cholecalciferol (CALCIUM 1000 + D PO) Take by mouth daily.      . carbamazepine (TEGRETOL-XR) 100 MG 12 hr tablet 1 qam  1  qhs 180 tablet 1  . Cholecalciferol (VITAMIN D) 2000 UNITS CAPS Take by mouth.    . Coenzyme Q10 (COQ10) 200 MG CAPS Take 1 capsule by mouth daily.    . Eszopiclone 3 MG TABS TAKE 1 TABLET BY MOUTH AT BEDTIME AS NEEDED TAKE IMMEDIATELY BEFORE BEDTIME 90 tablet 1  . ezetimibe (ZETIA) 10 MG tablet Take 1 tablet (10 mg total) by mouth daily. 90 tablet 3  . fenofibrate 160 MG tablet TAKE 1 TABLET BY MOUTH DAILY. REPEAT LABS ARE DUE NOW 90 tablet 0  . Flaxseed, Linseed, (FLAX SEED OIL PO) Take by mouth daily.      . fluticasone (CUTIVATE) 0.05 % cream   3  . levocetirizine (XYZAL) 5 MG tablet Take 1 tablet (5 mg total) by mouth every evening. (Patient taking differently: Take 5 mg by mouth daily as needed. ) 30 tablet 5  . lisinopril (PRINIVIL,ZESTRIL) 20 MG tablet TAKE 1 TABLET (20 MG TOTAL) BY MOUTH DAILY. 90 tablet 3  . LORazepam (ATIVAN) 0.5 MG tablet 2 bid  1  qday  prn 150 tablet 5  . Multiple Vitamin (MULTIVITAMIN) tablet Take 1 tablet by mouth daily.    [name] NEXIUM 40 MG capsule TAKE 1 CAPSULE  BY MOUTH ONCE DAILY AS NEEDED 90 capsule 0  . NONFORMULARY OR COMPOUNDED ITEM [facility]:  Onychomycosis Nail lacquer - Fluconazole 2%, Terbinafine 1%, DMSO, apply to affected area daily. 120 each 2  . tamsulosin (FLOMAX) 0.4 MG CAPS capsule 1 po qd 30 capsule   . traMADol (ULTRAM) 50 MG tablet Take 1 tablet (50 mg total) by mouth  every 12 (twelve) hours as needed. 10 tablet 0   No current facility-administered medications on file prior to visit.     BP 138/61 (BP Location: Left Arm, Cuff Size: Large)   Pulse 76   Temp 98.1 F (36.7 C) (Oral)   Resp 16   Ht 5\' 11"  (1.803 m)   Wt 234 lb (106.1 kg)   SpO2 95%   BMI 32.64 kg/m       Objective:   Physical Exam  General  Mental Status - Alert. General Appearance - Well groomed. Not in acute distress.  Skin Rashes- No Rashes.  HEENT Head- Normal. Ear Auditory Canal - Left- Normal. Right - Normal.Tympanic Membrane- Left- Normal. Right- Normal. Eye Sclera/Conjunctiva- Left- Normal. Right- Normal. Nose & Sinuses Nasal Mucosa- Left-  Boggy and Congested. Right-  Boggy and  Congested.Bilateral maxillary and frontal sinus pressure. Mouth & Throat Lips: Upper Lip- Normal: no dryness, cracking, pallor, cyanosis, or vesicular eruption. Lower Lip-Normal: no dryness, cracking, pallor, cyanosis or vesicular eruption. Buccal Mucosa- Bilateral- No Aphthous ulcers. Oropharynx- No Discharge or Erythema. Tonsils: Characteristics- Bilateral- No Erythema or Congestion. Size/Enlargement- Bilateral- No enlargement. Discharge- bilateral-None.  Neck Neck- Supple. No Masses.   Chest and Lung Exam Auscultation: Breath Sounds:-Clear even and unlabored.  Cardiovascular Auscultation:Rythm- Regular, rate and rhythm. Murmurs & Other Heart Sounds:Ausculatation of the heart reveal- No Murmurs.  Lymphatic Head & Neck General Head & Neck Lymphatics: Bilateral: Description- No Localized lymphadenopathy.       Assessment & Plan:   You  appear to have bronchitis and sinusitis. Rest hydrate and tylenol for fever. I am prescribing cough medicine hycodan, and doxycycline  antibiotic. For your nasal congestion flonase.  Flu and strep test was negative  You should gradually get better. If not then notify us and would recommend a chest xray.  Follow up in 7-10 days or as needed  [employer], [employer]

## 2017-08-12 NOTE — Telephone Encounter (Signed)
Would you result rapid strep test. I could not close note so had to take out order. Result was negative.

## 2017-08-12 NOTE — Patient Instructions (Addendum)
You appear to have bronchitis and sinusitis. Rest hydrate and tylenol for fever. I am prescribing cough medicine hycodan, and doxycycline  antibiotic. For your nasal congestion flonase.  Flu and strep test was negative  You should gradually get better. If not then notify us and would recommend a chest xray.  Follow up in 7-10 days or as needed

## 2017-08-15 ENCOUNTER — Encounter: Payer: Self-pay | Admitting: Family Medicine

## 2017-08-15 LAB — POCT RAPID STREP A (OFFICE): Rapid Strep A Screen: NEGATIVE

## 2017-08-15 MED FILL — LORazepam 0.5 MG TABS: 0.5 | 30 days supply | Qty: 150 | Fill #1

## 2017-08-15 NOTE — Telephone Encounter (Signed)
Order put back in and resulted

## 2017-08-15 NOTE — Addendum Note (Signed)
Addended by: Kem Boroughs D on: 08/15/2017 09:49 AM   Modules accepted: Orders

## 2017-08-23 ENCOUNTER — Ambulatory Visit (HOSPITAL_BASED_OUTPATIENT_CLINIC_OR_DEPARTMENT_OTHER)
Admission: RE | Admit: 2017-08-23 | Discharge: 2017-08-23 | Disposition: A | Discharge status: Home / Self Care | Payer: PPO | Source: Ambulatory Visit | Attending: Internal Medicine | Admitting: Internal Medicine

## 2017-08-23 ENCOUNTER — Ambulatory Visit (INDEPENDENT_AMBULATORY_CARE_PROVIDER_SITE_OTHER): Payer: PPO | Admitting: Internal Medicine

## 2017-08-23 ENCOUNTER — Encounter: Payer: Self-pay | Admitting: Internal Medicine

## 2017-08-23 VITALS — BP 124/66 | HR 66 | Temp 97.4°F | Resp 14 | Ht 71.0 in | Wt 228.2 lb

## 2017-08-23 DIAGNOSIS — R05 Cough: Secondary | ICD-10-CM | POA: Insufficient documentation

## 2017-08-23 DIAGNOSIS — R059 Cough, unspecified: Secondary | ICD-10-CM

## 2017-08-23 MED ORDER — HYDROCODONE-HOMATROPINE 5-1.5 MG/5ML PO SYRP: 5.0000 mL | ORAL_SOLUTION | Freq: Every evening | ORAL | 0 refills | Status: DC | PRN

## 2017-08-23 MED FILL — HYDROMET SYRUP: 5-1.5 | 20 days supply | Qty: 100 | Fill #0

## 2017-08-23 NOTE — Progress Notes (Signed)
Subjective:    Patient ID: William [name], male    DOB: 05-21-1941, 77 y.o.   MRN: [id number]  DOS:  08/23/2017 Type of visit - description : Acute, here w/ his wife Interval history: Was seen here 08/12/2017 with 5 days history of upper respiratory symptoms, dx with bronchitis and sinusitis, prescribed Hycodan and doxycycline.  Flu and strep test negative. He is here because he continue with cough, yellow sputum and chest congestion. Denies any squeezing. Some malaise. Hydrocodone helped but caused some drowsiness.   Review of Systems No fever chills. Had occasional nausea and vomiting in the mornings.  No diarrhea or blood in the stools. Metal status at baseline. Mild sinus congestion.   Past Medical History:  Diagnosis Date  . ANXIETY   . Arthritis    low back  . Bladder stone   . BPH (benign prostatic hypertrophy)   . Carotid artery occlusion   . 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 CVD (cerebrovascular disease)   . Eczema   . GERD (gastroesophageal reflux disease)   . History of carotid artery stenosis    S/P BILATERAL CEA  . 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 Hyperlipidemia   . Hypertension   . Nocturia   . S/P carotid endarterectomy    BILATERAL ICA--  PATENT PER DUPLEX  05-19-2012  . Stroke [facility]) 1996   pt states no weaknes but has severe anxiety  . Urinary frequency   . Vitamin D deficiency     Past Surgical History:  Procedure Laterality Date  . APPENDECTOMY  AS CHILD  . CARDIOVASCULAR STRESS TEST  03-27-2012  DR CRENSHAW   LOW RISK LEXISCAN STUDY-- PROBABLE NORMAL PERFUSION AND SOFT TISSUE ATTENUATION/  NO ISCHEMIA/ EF 51%  . CAROTID ENDARTERECTOMY Bilateral LEFT  11-12-2008  DR GREG HAYES   RIGHT ICA  2006  ([facility])  . CYSTOSCOPY WITH LITHOLAPAXY N/A 02/26/2013   Procedure: CYSTOSCOPY WITH LITHOLAPAXY;  Surgeon: Franchot Gallo, MD;  Location: [facility];  Service: Urology;   Laterality: N/A;  . EYE SURGERY  Jan. 2016   cataract surgery both eyes  . INGUINAL HERNIA REPAIR Right 11-08-2006  . MASS EXCISION N/A 03/03/2016   Procedure: EXCISION OF BACK  MASS;  Surgeon: Stark Klein, MD;  Location: [place];  Service: General;  Laterality: N/A;  . 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 PROSTATE SURGERY    . TRANSURETHRAL RESECTION OF PROSTATE N/A 02/26/2013   Procedure: TRANSURETHRAL RESECTION OF THE PROSTATE WITH GYRUS INSTRUMENTS;  Surgeon: Franchot Gallo, MD;  Location: [facility];  Service: Urology;  Laterality: N/A;    Social History   Socioeconomic History  . Marital status: Married    Spouse name: Not on file  . Number of children: 2  . Years of education: Not on file  . Highest education level: Not on file  Social Needs  . Financial resource strain: Not on file  . Food insecurity - worry: Not on file  . Food insecurity - inability: Not on file  . Transportation needs - medical: Not on file  . Transportation needs - non-medical: Not on file  Occupational History    Employer: Retired  Tobacco Use  . Smoking status: Former Smoker    Packs/day: 2.00    Years: 40.00    Pack years: 80.00    Types: Cigarettes  Last attempt to quit: 02/15/2005    Years since quitting: 12.5  . Smokeless tobacco: Never Used  Substance and Sexual Activity  . Alcohol use: Yes    Alcohol/week: 0.0 oz    Comment: Occasional  . Drug use: No  . Sexual activity: Yes    Partners: Female  Other Topics Concern  . Not on file  Social History Narrative   Exercise--  Walks dogs everyday      Allergies as of 08/23/2017      Reactions   Adhesive [tape] Other (See Comments)   BLISTER   Statins Other (See Comments)   myalgias   Strawberry (diagnostic) Hives      Medication List        Accurate as of 08/23/17 11:59 PM. Always use your most recent med list.          aspirin 325 MG EC tablet Take 325 mg by mouth  daily.   CALCIUM 1000 + D PO Take by mouth daily.   carbamazepine 100 MG 12 hr tablet Commonly known as:  TEGRETOL-XR 1 qam  1  qhs   CoQ10 200 MG Caps Take 1 capsule by mouth daily.   Eszopiclone 3 MG Tabs TAKE 1 TABLET BY MOUTH AT BEDTIME AS NEEDED TAKE IMMEDIATELY BEFORE BEDTIME   ezetimibe 10 MG tablet Commonly known as:  ZETIA Take 1 tablet (10 mg total) by mouth daily.   fenofibrate 160 MG tablet TAKE 1 TABLET BY MOUTH DAILY. REPEAT LABS ARE DUE NOW   FLAX SEED OIL PO Take by mouth daily.   fluticasone 0.05 % cream Commonly known as:  CUTIVATE   HYDROcodone-homatropine 5-1.5 MG/5ML syrup Commonly known as:  HYCODAN Take 5 mLs by mouth at bedtime as needed for cough.   levocetirizine 5 MG tablet Commonly known as:  XYZAL Take 1 tablet (5 mg total) by mouth every evening.   lisinopril 20 MG tablet Commonly known as:  PRINIVIL,ZESTRIL TAKE 1 TABLET (20 MG TOTAL) BY MOUTH DAILY.   LORazepam 0.5 MG tablet Commonly known as:  ATIVAN 2 bid  1  qday  prn   multivitamin tablet Take 1 tablet by mouth daily.   NEXIUM 40 MG capsule Generic drug:  esomeprazole TAKE 1 CAPSULE BY MOUTH ONCE DAILY AS NEEDED   NONFORMULARY OR COMPOUNDED ITEM [facility]:  Onychomycosis Nail lacquer - Fluconazole 2%, Terbinafine 1%, DMSO, apply to affected area daily.   tamsulosin 0.4 MG Caps capsule Commonly known as:  FLOMAX 1 po qd   traMADol 50 MG tablet Commonly known as:  ULTRAM Take 1 tablet (50 mg total) by mouth every 12 (twelve) hours as needed.   VITAMIN B COMPLEX PO Take by mouth daily.   Vitamin D 2000 units Caps Take by mouth.          Objective:   Physical Exam BP 124/66 (BP Location: Right Arm, Patient Position: Sitting, Cuff Size: Normal)   Pulse 66   Temp (!) 97.4 F (36.3 C) (Oral)   Resp 14   Ht 5\' 11"  (1.803 m)   Wt 228 lb 4 oz (103.5 kg)   SpO2 97%   BMI 31.83 kg/m  General:   Well developed, well nourished . NAD.  HEENT:    Normocephalic . Face symmetric, atraumatic. TMs normal, nose congested, sinuses non-TTP Lungs:  Few rhonchi but otherwise clear. Normal respiratory effort, no intercostal retractions, no accessory muscle use. Heart: RRR,  no murmur.  No pretibial edema bilaterally  Skin: Not pale. Not  jaundice Neurologic:  alert & oriented X3.  Speech normal, gait appropriate for age and unassisted Psych--  Cognition and judgment appear intact.  Cooperative with normal attention span and concentration.  Behavior appropriate. No anxious or depressed appearing.      Assessment & Plan:   77 year old gentleman with PMH that includes dementia, anxiety, depression, stroke, hyperglycemia, mild renal insufficiency, on multiple meds including Tegretol presents with  Persistent cough: Symptoms of started almost 3 weeks ago, he continue with cough, is afebrile, status post doxycycline.  Nontoxic appearing, VSS. Recommend to get a chest x-ray, if negative will continue with supportive care for a few more days, see AVS.  If not better, consider a second round of antibiotics. Will decrease hydrocodone to nighttime only due to drowsiness. He has some nausea, if that does not improve after respiratory symptoms decrease,  he will let me know.

## 2017-08-23 NOTE — Progress Notes (Signed)
Pre visit review using our clinic review tool, if applicable. No additional management support is needed unless otherwise documented below in the visit note. 

## 2017-08-23 NOTE — Patient Instructions (Signed)
Get chest x-ray downstairs   Rest, fluids , tylenol  For cough:  Take Mucinex DM twice a day as needed until better If the cough continue at night, take hydrocodone   For nasal congestion: Use OTC  Flonase : 2 nasal sprays on each side of the nose in the morning until you feel better   Avoid decongestants such as  Pseudoephedrine or phenylephrine     Call if not gradually better over the next few days.  Call anytime if the symptoms are severe

## 2017-08-29 MED FILL — carBAMazepine ER 100 MG TB1: 100 | 30 days supply | Qty: 60 | Fill #6

## 2017-08-31 ENCOUNTER — Inpatient Hospital Stay: Admit: 2017-08-31 | Discharge: 2017-09-01 | Primary: Internal Medicine

## 2017-08-31 DIAGNOSIS — E611 Iron deficiency: Secondary | ICD-10-CM

## 2017-08-31 DIAGNOSIS — D631 Anemia in chronic kidney disease: Secondary | ICD-10-CM

## 2017-08-31 DIAGNOSIS — N184 Chronic kidney disease, stage 4 (severe): Secondary | ICD-10-CM

## 2017-09-02 ENCOUNTER — Ambulatory Visit: Admit: 2017-09-02 | Discharge: 2017-09-02 | Attending: Hematology & Oncology | Primary: Internal Medicine

## 2017-09-02 DIAGNOSIS — E611 Iron deficiency: Secondary | ICD-10-CM

## 2017-09-02 DIAGNOSIS — K635 Polyp of colon: Secondary | ICD-10-CM

## 2017-09-02 DIAGNOSIS — I509 Heart failure, unspecified: Principal | ICD-10-CM

## 2017-09-02 DIAGNOSIS — K5730 Diverticulosis of large intestine without perforation or abscess without bleeding: Secondary | ICD-10-CM

## 2017-09-02 DIAGNOSIS — Z955 Presence of coronary angioplasty implant and graft: Secondary | ICD-10-CM

## 2017-09-02 DIAGNOSIS — E119 Type 2 diabetes mellitus without complications: Secondary | ICD-10-CM

## 2017-09-02 DIAGNOSIS — N184 Chronic kidney disease, stage 4 (severe): Secondary | ICD-10-CM

## 2017-09-02 DIAGNOSIS — E785 Hyperlipidemia, unspecified: Secondary | ICD-10-CM

## 2017-09-02 DIAGNOSIS — D631 Anemia in chronic kidney disease: Secondary | ICD-10-CM

## 2017-09-02 DIAGNOSIS — I4891 Unspecified atrial fibrillation: Secondary | ICD-10-CM

## 2017-09-02 DIAGNOSIS — D509 Iron deficiency anemia, unspecified: Secondary | ICD-10-CM

## 2017-09-02 DIAGNOSIS — I10 Essential (primary) hypertension: Secondary | ICD-10-CM

## 2017-09-02 DIAGNOSIS — I110 Hypertensive heart disease with heart failure: Secondary | ICD-10-CM

## 2017-09-02 DIAGNOSIS — I639 Cerebral infarction, unspecified: Secondary | ICD-10-CM

## 2017-09-02 DIAGNOSIS — F1010 Alcohol abuse, uncomplicated: Secondary | ICD-10-CM

## 2017-09-02 DIAGNOSIS — Z79899 Other long term (current) drug therapy: Secondary | ICD-10-CM

## 2017-09-02 DIAGNOSIS — I2510 Atherosclerotic heart disease of native coronary artery without angina pectoris: Secondary | ICD-10-CM

## 2017-09-02 DIAGNOSIS — I6350 Cerebral infarction due to unspecified occlusion or stenosis of unspecified cerebral artery: Secondary | ICD-10-CM

## 2017-09-02 MED ORDER — OXYCODONE-ACETAMINOPHEN 5-325 MG/5ML PO SOLN
ORAL
Start: 2017-09-02 — End: ?

## 2017-09-02 NOTE — Progress Notes
following week to receive cycle 2. The patient states that he had been suffering from persistent pain in the hip and is being evaluated by orthopedics and pain management. The patient received dose 2 of Injectafer in February 2016, July 2016, Septmeber 2016 and February 2017.   ??  The patient underwent a bone marrow biopsy and aspirate 04/28/15 to evaluate for possible MDS. This revealed a hyperplastic marrow (65% cellularity) with mild myeloid hyperplasia and erythroid hyperplasia. Flow cytometry showed no evidence of acute leukemia, B-cell lymphproliferative process or T-cell aberrancy. Cytogenetics: 66 XY karyotype. FISH analysis: Normal results seen for 3 every week, 5, 7, 8, 11(MLL), 12(ETV6), 17, 19, 20. This was interpreted as a normal result. In the absence of a cytogenetic/fish abnormality, the changes are likely reactive. Clinical correlation and followup are recommended.  ??  The patient received a transfusion in November 2016 and has received several doses of intravenous iron. The patient states that he has elected not to return to the GI physicians as he is unwilling to undergo any additional GI evaluations.  ?  The patient returns for the scheduled appointment. The Procrit has been held due to a decreased iron saturation. The patient was instructed to take oral ferrous sulfate daily. However, the patient admits to not taking the iron supplement. The patient denies any new medical issues and voices no new complaints. The patient denies symptoms of shortness of breath, chest pain, bleeding including bright red blood per rectum or black stools.  Past Surgical History:   Past Surgical History:   Procedure Laterality Date   ? APPENDECTOMY     ? CORONARY ANGIOPLASTY WITH STENT PLACEMENT      x 2       Social History:    Social History     Social History   ? Marital status: Divorced     Spouse name: N/A   ? Number of children: N/A   ? Years of education: N/A     Occupational History   ? Not on file.

## 2017-09-02 NOTE — Progress Notes
greater, then we will resume Procrit 20,000 units subcu every 2 weeks.  4. As always, Jeffery Gonzales understands that he may return to the office sooner than the scheduled appointment if any questions or concerns arise during the interim.    Return to Clinic: Return in about 3 weeks (around 09/23/2017).      This document was created using voice recognition software. Inadvertent typographical errors, word omissions and/or word substitutions may exist.

## 2017-09-02 NOTE — Progress Notes
?   Lymphocytes Absolute 08/09/2017 0.37    ? Lymphocytes % 08/09/2017 8.6*   ? Monocytes Absolute 08/09/2017 0.61    ? Monocytes % 08/09/2017 14.1    ? Eosinophils Absolute 08/09/2017 0.16    ? Eosinophils % 08/09/2017 3.7    ? Basophil Absolute 08/09/2017 0.02    ? BASOPHILS 08/09/2017 0.5    Hospital Outpatient Visit on 07/07/2017   Component Date Value   ? WBC 07/07/2017 3.59*   ? RBC 07/07/2017 3.13    ? Hemoglobin 07/07/2017 8.7*   ? Hematocrit 07/07/2017 27.9*   ? MCV 07/07/2017 89.1    ? [facility] 07/07/2017 27.8    ? MCHC 07/07/2017 31.2    ? RDW 07/07/2017 15.1    ? Platelet Count 07/07/2017 158    ? MPV 07/07/2017 9.4    ? Neutrophils Absolute 07/07/2017 2.54    ? Neutrophils % 07/07/2017 70.8    ? Lymphocytes Absolute 07/07/2017 0.31    ? Lymphocytes % 07/07/2017 8.6*   ? Monocytes Absolute 07/07/2017 0.55    ? Monocytes % 07/07/2017 15.3    ? Eosinophils Absolute 07/07/2017 0.18    ? Eosinophils % 07/07/2017 5.0    ? Basophil Absolute 07/07/2017 0.01    ? BASOPHILS 07/07/2017 0.3          Imaging Results:     Health Maintenance: Colonoscopy:2015    Physical Exam:   General: Well developed, well nourished, No acute Distress, Gentleman patient and Elderly appearance  Head:Atraumatic and normocephalic  Eyes:PERRLA, EOMS intact, No jaundice, pallor and Conjunctiva clear  Ears, Nose, Throat, Neck, and Mouth: Trachea Midline, No JVD, No Lymphadenopathy, Thyroid midline-normal, Neck supple and No oral lesions  Cardiovascular:?Atrial fibrillation  Chest:Symmetrical, No kyphosis and No scoliosis  Respiratory:Clear, No rales/Rhonchi  Gastrointestinal:Abdomen soft, Abdomen non-tender, Abdomen non-distended, Abdomen without masses, No ascites and No hepatosplenomegaly  Hematologic/Lymphatic:No petechiae, No purpura, No neck lymphadenopathy, No Axillary lymphadenopathy and No Groin lymphadenopathy  Musculoskeletal: Normal gait, normal strength/tone  Extremities: 1+?edema, No cyanosis, No digital clubbing and No

## 2017-09-02 NOTE — Patient Instructions
RTC 3 weeks  Labs 3 days PTV-CBC and Iron profile

## 2017-09-02 NOTE — Progress Notes
Coralee North, MD  [address] [place] [address]  ?    Chief Complaint   Patient presents with   ? Follow-up     labs       Patient Name: Jeffery Gonzales    Date: 09/02/2017    Date Of Birth: 1940-12-15    History of Present Illness:  Jeffery Gonzales is a 59?year old Caucasian male who was referred to the hematology office for an evaluation of anemia. The patient has undergone an evaluation by Dr. Carolina Cellar at [facility] due to iron deficiency anemia. The patient has a history of a small bowel GI bleed in 2013 and a second episode in February 2015 most recently occurring in the setting of Xarelto use. The patient has required several transfusions in the past. The patient denies any recent evidence of bleeding specifically no bright red blood per rectum or black stools. He underwent a colonoscopy exam 10/17/2013 which revealed a 3 mm sessile polyp in the descending colon which was removed, a 3 mm sessile polyp in the descending colon which was removed, a diminutive sessile polyp in the sigmoid colon which was removed, severe diverticulosis and otherwise a normal exam. An upper endoscopy obtained 10/17/2013 revealed patchy erythema in the antrum of the stomach but otherwise a normal exam. A capsule endoscopy obtained 10/18/2013 revealed a small erythematous spot in the proximal-mi jejunum without stigmata for bleeding. The remainder of the small bowel exam was normal. As there were no overt bleeding sites identified on the GI evaluation the patient was referred to the hematology office for additional assessment of the anemia and to evaluate for a possible myelodysplasia.  ??  The patient has a significant history of coronary artery disease, diabetes, congestive heart failure, hypertension, hyperlipidemia. He states that he fell and required hip surgery in March 2015.  ??  The patient received a dose of Injectafer 06/18/2014 but did not return the

## 2017-09-02 NOTE — Progress Notes
Social History Main Topics   ? Smoking status: Never Smoker   ? Smokeless tobacco: Never Used   ? Alcohol use 216.0 oz/week     18 Cans of beer per week   ? Drug use: No   ? Sexual activity: No     Other Topics Concern   ? Not on file     Social History Narrative   ? No narrative on file       Family History:    Family History   Problem Relation Age of Onset   ? Diabetes Mother    ? Cancer Father        Vital Signs:   Vitals:    09/02/17 1124   BP: 134/69   Pulse: 89   Resp: 16   Temp: 37.2 ?C (99 ?F)   Weight: 89.6 kg (197 lb 9.6 oz)   Height: 1.778 m (5\' 10" )       Allergies: No known drug allergy    Review of Systems:  Constitutional:?Weight loss: Due to diuresis  No fatigue  No loss of appetite  No night sweats  No fever  No chills?  Eyes:?No blurred vision  No double vision  No difficulty seeing  Ears,Nose, Mouth,Throat: No hearing loss  No ringing in ears  No sinus trouble  No difficulty swallowing  No sore throat  No nasal drainage  No nose bleeds  No hoarseness  Cardiac:No heart papitations  No lightheadedness  Swelling in legs: Improved  Respiratory:No cough  No sputum production  No hemoptysis  No shortness of breath  No orthopnea  Gastrointestinal:No nausea  No vomiting  No heartburn  No constipation  No diarrhea  No abdominal pain  No rectal bleeding  No Bowel incontinence  Genitourinary:No burning on urination  No pain with urination  No blood in urine  No frequent urination  No urinary incontinence  Musculosketal:No muscle pain  No stiffness  No joint pain  No joint swelling  No back pain  Skin:No skin rash  Neurological:No seizures  No dizziness  No loss of balance  No weakness of limbs  No loss of sensation  Tingling sensations bilateral hands  No memory loss  No difficulty talking  Psychiatric:No nervousness  No depression  No restlessness  No difficulty sleeping  Hematologic/Lymphatic/Immunologic:No bleeding  No lumps in arm pits  No lumps in neck  No lumps in   ?

## 2017-09-02 NOTE — Progress Notes
discoloration  Neurologic:Alert and oriented, No Hemiparesis and No motor deficits    Assessment:  Jeffery Gonzales is a 61?year old male who was referred to the hematology office for an evaluation of anemia. 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 The patient completed a GI evaluation which did not reveal a specific source of bleeding. However, I suspected?that the patient likely had chronic GI blood loss resulting in the anemia. The patient has a history of prior GI blood loss which was exacerbated by Xarelto. The patient was on anticoagulation due to the congestive heart failure and atrial fibrillation however this has since been discontinued. The patient remains?on aspirin therapy alone.  ??  There were no findings of B12 deficiency, folate deficiency, or hemolysis. The bone marrow biopsy did not show evidence of leukemia, lymphoma, myelodysplasia, or myelofibrosis. The changes noted in the bone marrow were felt consistent with a reactive process.   ?  I have reviewed the laboratory results with the patient. The hemoglobin remains stable. Procrit was held due to a reduction in the iron indices. I have explained to the patient that Procrit will need to remain on hold as the iron saturation remains less than 20. The patient admits to not taking the oral iron as directed. We have discussed options including the patient taking daily oral iron versus proceeding with additional parenteral iron infusions. The patient indicated that he will take the oral iron daily as recommended.    Recommendation/Plan:   1. We will continue to hold Procrit due to the decreased iron saturation.  2. The patient was instructed to take ferrous sulfate 325 mg by mouth twice a day.  3. The patient will return for an exam in 3 weeks obtaining a CBC and iron profile shortly prior to the return visit. If the iron saturation is 20 or

## 2017-09-02 NOTE — Progress Notes
The past/family/social history and review of systems remains accurate as of today's date 09/02/2017    Medications:   Current Outpatient Prescriptions:   ?  atorvastatin (LIPITOR) 40 MG PO Tablet, Take 40 mg by mouth daily., Disp: , Rfl:   ?  docusate calcium (SURFAK) 240 MG PO Capsule, Take 240 mg by mouth 2 times daily as needed for constipation., Disp: , Rfl:   ?  furosemide (LASIX) 40 MG PO Tablet, Take 40 mg by mouth., Disp: , Rfl:   ?  oxyCODONE-acetaminophen (ROXICET) 5-325 MG/5ML PO Solution, by mouth., Disp: , Rfl:       Labs:   Hospital Outpatient Visit on 08/31/2017   Component Date Value   ? Iron 08/31/2017 29*   ? Transferrin 08/31/2017 127*   ? TIBC 08/31/2017 161*   ? Iron Saturation 08/31/2017 18*   ? WBC 08/31/2017 4.64    ? RBC 08/31/2017 3.44    ? Hemoglobin 08/31/2017 9.5*   ? Hematocrit 08/31/2017 30.9*   ? MCV 08/31/2017 89.8    ? [facility] 08/31/2017 27.6    ? MCHC 08/31/2017 30.7    ? RDW 08/31/2017 18.4    ? Platelet Count 08/31/2017 181    ? MPV 08/31/2017 10.2    ? Neutrophils Absolute 08/31/2017 3.51    ? Neutrophils % 08/31/2017 75.7    ? Lymphocytes Absolute 08/31/2017 0.33    ? Lymphocytes % 08/31/2017 7.1*   ? Monocytes Absolute 08/31/2017 0.65    ? Monocytes % 08/31/2017 14.0    ? Eosinophils Absolute 08/31/2017 0.14    ? Eosinophils % 08/31/2017 3.0    ? Basophil Absolute 08/31/2017 0.01    ? BASOPHILS 08/31/2017 0.2    Hospital Outpatient Visit on 08/09/2017   Component Date Value   ? Ferritin 08/09/2017 450.1*   ? Iron 08/09/2017 32    ? Transferrin 08/09/2017 137*   ? TIBC 08/09/2017 174*   ? Iron Saturation 08/09/2017 18*   ? WBC 08/09/2017 4.32*   ? RBC 08/09/2017 3.54    ? Hemoglobin 08/09/2017 9.7*   ? Hematocrit 08/09/2017 31.8*   ? MCV 08/09/2017 89.8    ? [facility] 08/09/2017 27.4    ? MCHC 08/09/2017 30.5    ? RDW 08/09/2017 18.6    ? Platelet Count 08/09/2017 161    ? MPV 08/09/2017 10.1    ? Neutrophils Absolute 08/09/2017 3.16    ? Neutrophils % 08/09/2017 73.1

## 2017-09-06 DIAGNOSIS — D225 Melanocytic nevi of trunk: Secondary | ICD-10-CM | POA: Diagnosis not present

## 2017-09-06 DIAGNOSIS — D0471 Carcinoma in situ of skin of right lower limb, including hip: Secondary | ICD-10-CM | POA: Diagnosis not present

## 2017-09-15 ENCOUNTER — Other Ambulatory Visit: Payer: Self-pay | Admitting: Family Medicine

## 2017-09-19 ENCOUNTER — Other Ambulatory Visit: Payer: Self-pay | Admitting: Family Medicine

## 2017-09-19 MED FILL — FENOFIBRATE 160 MG TABLET: 160 | 90 days supply | Qty: 90 | Fill #0

## 2017-09-19 MED FILL — LORazepam 0.5 MG TABS: 0.5 | 30 days supply | Qty: 150 | Fill #2

## 2017-09-19 MED FILL — NexIUM 40 MG CPDR: 40 | 90 days supply | Qty: 90 | Fill #0

## 2017-09-20 ENCOUNTER — Inpatient Hospital Stay: Admit: 2017-09-20 | Discharge: 2017-09-21 | Primary: Internal Medicine

## 2017-09-20 DIAGNOSIS — D631 Anemia in chronic kidney disease: Secondary | ICD-10-CM

## 2017-09-20 DIAGNOSIS — E611 Iron deficiency: Principal | ICD-10-CM

## 2017-09-20 DIAGNOSIS — N184 Chronic kidney disease, stage 4 (severe): Secondary | ICD-10-CM

## 2017-09-23 ENCOUNTER — Ambulatory Visit: Admit: 2017-09-23 | Discharge: 2017-09-23 | Attending: Hematology & Oncology | Primary: Internal Medicine

## 2017-09-23 DIAGNOSIS — E611 Iron deficiency: Principal | ICD-10-CM

## 2017-09-23 DIAGNOSIS — I639 Cerebral infarction, unspecified: Secondary | ICD-10-CM

## 2017-09-23 DIAGNOSIS — M7989 Other specified soft tissue disorders: Secondary | ICD-10-CM

## 2017-09-23 DIAGNOSIS — I2510 Atherosclerotic heart disease of native coronary artery without angina pectoris: Secondary | ICD-10-CM

## 2017-09-23 DIAGNOSIS — I4891 Unspecified atrial fibrillation: Secondary | ICD-10-CM

## 2017-09-23 DIAGNOSIS — Z7982 Long term (current) use of aspirin: Secondary | ICD-10-CM

## 2017-09-23 DIAGNOSIS — Z955 Presence of coronary angioplasty implant and graft: Secondary | ICD-10-CM

## 2017-09-23 DIAGNOSIS — I509 Heart failure, unspecified: Secondary | ICD-10-CM

## 2017-09-23 DIAGNOSIS — E119 Type 2 diabetes mellitus without complications: Secondary | ICD-10-CM

## 2017-09-23 DIAGNOSIS — D631 Anemia in chronic kidney disease: Secondary | ICD-10-CM

## 2017-09-23 DIAGNOSIS — Z79899 Other long term (current) drug therapy: Secondary | ICD-10-CM

## 2017-09-23 DIAGNOSIS — I6350 Cerebral infarction due to unspecified occlusion or stenosis of unspecified cerebral artery: Secondary | ICD-10-CM

## 2017-09-23 DIAGNOSIS — D649 Anemia, unspecified: Secondary | ICD-10-CM

## 2017-09-23 DIAGNOSIS — Z9049 Acquired absence of other specified parts of digestive tract: Secondary | ICD-10-CM

## 2017-09-23 DIAGNOSIS — N184 Chronic kidney disease, stage 4 (severe): Secondary | ICD-10-CM

## 2017-09-23 DIAGNOSIS — Z86010 Personal history of colonic polyps: Secondary | ICD-10-CM

## 2017-09-23 DIAGNOSIS — I10 Essential (primary) hypertension: Secondary | ICD-10-CM

## 2017-09-23 DIAGNOSIS — I110 Hypertensive heart disease with heart failure: Secondary | ICD-10-CM

## 2017-09-23 DIAGNOSIS — K5730 Diverticulosis of large intestine without perforation or abscess without bleeding: Secondary | ICD-10-CM

## 2017-09-23 DIAGNOSIS — F1010 Alcohol abuse, uncomplicated: Secondary | ICD-10-CM

## 2017-09-23 DIAGNOSIS — E785 Hyperlipidemia, unspecified: Secondary | ICD-10-CM

## 2017-09-23 MED ORDER — FERRIC CARBOXYMALTOSE IVPB
750 mg | Freq: Once | INTRAVENOUS | Status: CN
Start: 2017-09-23 — End: ?

## 2017-09-23 MED ORDER — EPINEPHRINE 0.3 MG/0.3ML IJ SOAJ
0.3 mg | INTRAMUSCULAR | Status: CN | PRN
Start: 2017-09-23 — End: ?

## 2017-09-23 MED ORDER — DEXAMETHASONE SODIUM PHOSPHATE 20 MG/5ML IJ SOLN
20 mg | INTRAVENOUS | Status: CN | PRN
Start: 2017-09-23 — End: ?

## 2017-09-23 MED ORDER — SODIUM CHLORIDE FLUSH 0.9 % IV SOLN
10 mL | Status: CN | PRN
Start: 2017-09-23 — End: ?

## 2017-09-23 MED ORDER — HEPARIN SODIUM LOCK FLUSH 100 UNIT/ML IV SOLN
500 [IU] | Status: CN | PRN
Start: 2017-09-23 — End: ?

## 2017-09-23 MED ORDER — HYDROCORTISONE NA SUCCINATE PF 100 MG IJ SOLR
100 mg | INTRAVENOUS | Status: CN | PRN
Start: 2017-09-23 — End: ?

## 2017-09-23 MED ORDER — DIPHENHYDRAMINE HCL 50 MG/ML IJ SOLN
25 mg | INTRAVENOUS | Status: CN | PRN
Start: 2017-09-23 — End: ?

## 2017-09-23 MED ORDER — SODIUM CHLORIDE 0.9 % IV SOLN
Freq: Once | INTRAVENOUS | Status: CN
Start: 2017-09-23 — End: ?

## 2017-09-23 MED ORDER — MEPERIDINE HCL 25 MG/ML IJ SOLN
25 mg | INTRAVENOUS | Status: CN | PRN
Start: 2017-09-23 — End: ?

## 2017-09-26 ENCOUNTER — Encounter: Payer: Self-pay | Admitting: Family Medicine

## 2017-09-26 ENCOUNTER — Ambulatory Visit (INDEPENDENT_AMBULATORY_CARE_PROVIDER_SITE_OTHER): Payer: PPO | Admitting: Family Medicine

## 2017-09-26 VITALS — BP 130/68 | HR 71 | Temp 97.9°F | Resp 16 | Ht 71.0 in | Wt 232.6 lb

## 2017-09-26 DIAGNOSIS — Z0000 Encounter for general adult medical examination without abnormal findings: Secondary | ICD-10-CM | POA: Diagnosis not present

## 2017-09-26 DIAGNOSIS — I10 Essential (primary) hypertension: Secondary | ICD-10-CM

## 2017-09-26 DIAGNOSIS — D0471 Carcinoma in situ of skin of right lower limb, including hip: Secondary | ICD-10-CM

## 2017-09-26 DIAGNOSIS — E785 Hyperlipidemia, unspecified: Secondary | ICD-10-CM

## 2017-09-26 DIAGNOSIS — Z91030 Bee allergy status: Secondary | ICD-10-CM | POA: Diagnosis not present

## 2017-09-26 HISTORY — DX: Carcinoma in situ of skin of right lower limb, including hip: D04.71

## 2017-09-26 MED ORDER — EPINEPHRINE 0.3 MG/0.3ML IJ SOAJ: 0.3000 mg | Freq: Once | INTRAMUSCULAR | 1 refills | Status: DC

## 2017-09-26 MED ORDER — NONFORMULARY OR COMPOUNDED ITEM: 0 refills | Status: DC

## 2017-09-26 MED FILL — EPINEPHRINE 0.3 MG AUTO-INJ: 0.3 | 2 days supply | Qty: 2 | Fill #0

## 2017-09-26 MED FILL — carBAMazepine ER 100 MG TB1: 100 | 30 days supply | Qty: 60 | Fill #7

## 2017-09-26 MED FILL — EZETIMIBE 10 MG TABLET: 10 | 90 days supply | Qty: 90 | Fill #2

## 2017-09-26 MED FILL — ESZOPICLONE 3 MG TABLET: 3 | 90 days supply | Qty: 90 | Fill #1

## 2017-09-26 NOTE — Progress Notes (Signed)
Patient ID: William [name], male    DOB: July 15, 1940  Age: 77 y.o. MRN: [id number]    Subjective:  Subjective  HPI William [name] presents for cpe.[name]   He sees urology for psa/ and exam.   No complaints.    Review of Systems  Constitutional: Negative for chills and fever.  HENT: Negative for congestion and hearing loss.   Eyes: Negative for discharge.  Respiratory: Negative for cough and shortness of breath.   Cardiovascular: Negative for chest pain, palpitations and leg swelling.  Gastrointestinal: Negative for abdominal pain, blood in stool, constipation, diarrhea, nausea and vomiting.  Genitourinary: Negative for dysuria, frequency, hematuria and urgency.  Musculoskeletal: Negative for back pain and myalgias.  Skin: Negative for rash.  Allergic/Immunologic: Negative for environmental allergies.  Neurological: Negative for dizziness, weakness and headaches.  Hematological: Does not bruise/bleed easily.  Psychiatric/Behavioral: Negative for suicidal ideas. The patient is not nervous/anxious.     History Past Medical History:  Diagnosis Date  . ANXIETY   . Arthritis    low back  . Bladder stone   . BPH (benign prostatic hypertrophy)   . Carotid artery occlusion   . 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 CVD (cerebrovascular disease)   . Eczema   . GERD (gastroesophageal reflux disease)   . History of carotid artery stenosis    S/P BILATERAL CEA  . 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 Hyperlipidemia   . Hypertension   . Nocturia   . S/P carotid endarterectomy    BILATERAL ICA--  PATENT PER DUPLEX  05-19-2012  . Squamous cell carcinoma    right calf  . Stroke [facility]) 1996   pt states no weaknes but has severe anxiety  . Urinary frequency   . Vitamin D deficiency     He has a past surgical history that includes Carotid endarterectomy (Bilateral, LEFT  11-12-2008  DR GREG HAYES); Inguinal hernia repair (Right, 11-08-2006);  Cardiovascular stress test (03-27-2012  DR CRENSHAW); Appendectomy (AS CHILD); Cystoscopy with litholapaxy (N/A, 02/26/2013); Transurethral resection of prostate (N/A, 02/26/2013); Prostate surgery; Mohs surgery (Left, 1/ 2016); Eye surgery (Jan. 2016); and Mass excision (N/A, 03/03/2016).   His family history includes Bipolar disorder in his mother; Heart disease in his father and mother.He reports that he quit smoking about 12 years ago. His smoking use included cigarettes. He has a 80.00 pack-year smoking history. He has never used smokeless tobacco. He reports that he drinks alcohol. He reports that he does not use drugs.  Current Outpatient Medications on File Prior to Visit  Medication Sig Dispense Refill  . aspirin 325 MG EC tablet Take 325 mg by mouth daily.    . Calcium Carb-Cholecalciferol (CALCIUM 1000 + D PO) Take by mouth daily.      . carbamazepine (TEGRETOL-XR) 100 MG 12 hr tablet 1 qam  1  qhs 180 tablet 1  . Cholecalciferol (VITAMIN D) 2000 UNITS CAPS Take by mouth.    . Coenzyme Q10 (COQ10) 200 MG CAPS Take 1 capsule by mouth daily.    . Eszopiclone 3 MG TABS TAKE 1 TABLET BY MOUTH AT BEDTIME AS NEEDED TAKE IMMEDIATELY BEFORE BEDTIME 90 tablet 1  . ezetimibe (ZETIA) 10 MG tablet Take 1 tablet (10 mg total) by mouth daily. 90 tablet 3  . fenofibrate 160 MG tablet TAKE 1 TABLET BY MOUTH DAILY. REPEAT LABS ARE DUE NOW 90 tablet 0  . Flaxseed, Linseed, (FLAX SEED  OIL PO) Take by mouth daily.      . fluticasone (CUTIVATE) 0.05 % cream   3  . HYDROcodone-homatropine (HYCODAN) 5-1.5 MG/5ML syrup Take 5 mLs by mouth at bedtime as needed for cough. 100 mL 0  . levocetirizine (XYZAL) 5 MG tablet Take 1 tablet (5 mg total) by mouth every evening. (Patient taking differently: Take 5 mg by mouth daily as needed. ) 30 tablet 5  . lisinopril (PRINIVIL,ZESTRIL) 20 MG tablet TAKE 1 TABLET (20 MG TOTAL) BY MOUTH DAILY. 90 tablet 3  . LORazepam (ATIVAN) 0.5 MG tablet 2 bid  1  qday  prn 150 tablet 5    . Multiple Vitamin (MULTIVITAMIN) tablet Take 1 tablet by mouth daily.    [name] NEXIUM 40 MG capsule TAKE 1 CAPSULE BY MOUTH ONCE DAILY AS NEEDED 90 capsule 0  . NONFORMULARY OR COMPOUNDED ITEM [facility]:  Onychomycosis Nail lacquer - Fluconazole 2%, Terbinafine 1%, DMSO, apply to affected area daily. 120 each 2  . tamsulosin (FLOMAX) 0.4 MG CAPS capsule 1 po qd 30 capsule   . traMADol (ULTRAM) 50 MG tablet Take 1 tablet (50 mg total) by mouth every 12 (twelve) hours as needed. 10 tablet 0   No current facility-administered medications on file prior to visit.      Objective:  Objective  Physical Exam  Constitutional: He is oriented to person, place, and time. Vital signs are normal. He appears well-developed and well-nourished. He is sleeping. No distress.  HENT:  Head: Normocephalic and atraumatic.  Right Ear: External ear normal.  Left Ear: External ear normal.  Nose: Nose normal.  Mouth/Throat: Oropharynx is clear and moist. No oropharyngeal exudate.  Eyes: Pupils are equal, round, and reactive to light. Conjunctivae and EOM are normal. Right eye exhibits no discharge. Left eye exhibits no discharge.  Neck: Normal range of motion. Neck supple. No JVD present. No thyromegaly present.  Cardiovascular: Normal rate, regular rhythm and intact distal pulses.  No murmur heard. Pulmonary/Chest: Effort normal and breath sounds normal. No respiratory distress. He has no wheezes. He has no rales. He exhibits no tenderness.  Abdominal: Soft. Bowel sounds are normal. He exhibits no distension and no mass. There is no tenderness. There is no rebound and no guarding.  Genitourinary:  Genitourinary Comments: Per urology  Musculoskeletal: Normal range of motion. He exhibits no edema or tenderness.  Lymphadenopathy:    He has no cervical adenopathy.  Neurological: He is alert and oriented to person, place, and time. He has normal reflexes. He displays normal reflexes. No cranial nerve deficit.  He exhibits normal muscle tone.  Skin: Skin is warm and dry. No rash noted. He is not diaphoretic. No erythema.  Psychiatric: He has a normal mood and affect. His behavior is normal. Judgment and thought content normal.   BP 130/68 (BP Location: Right Arm, Cuff Size: Normal)   Pulse 71   Temp 97.9 F (36.6 C) (Oral)   Resp 16   Ht 5\' 11"  (1.803 m)   Wt 232 lb 9.6 oz (105.5 kg)   SpO2 96%   BMI 32.44 kg/m  Wt Readings from Last 3 Encounters:  09/26/17 232 lb 9.6 oz (105.5 kg)  08/23/17 228 lb 4 oz (103.5 kg)  08/12/17 234 lb (106.1 kg)     Lab Results  Component Value Date   WBC 7.8 04/19/2017   HGB 12.9 (L) 04/19/2017   HCT 38.7 (L) 04/19/2017   PLT 244.0 04/19/2017   GLUCOSE 104 (H) 03/25/2017  CHOL 194 03/25/2017   TRIG 93.0 03/25/2017   HDL 50.90 03/25/2017   LDLCALC 125 (H) 03/25/2017   ALT 30 04/19/2017   AST 27 04/19/2017   NA 139 03/25/2017   K 4.1 03/25/2017   CL 104 03/25/2017   CREATININE 1.45 03/25/2017   BUN 20 03/25/2017   CO2 27 03/25/2017   TSH 1.668 10/16/2013   PSA 2.10 12/24/2016   INR 1.0 11/08/2008   HGBA1C 5.9 03/25/2017    Dg Chest 2 View  Result Date: 08/24/2017 CLINICAL DATA:  Pt having cough and congestion for a week,nonsmoker EXAM: CHEST - 2 VIEW COMPARISON:  05/14/2015 FINDINGS: Stable linear scarring or subsegmental atelectasis laterally at the left lung base. Lungs otherwise clear. Heart size and mediastinal contours are within normal limits. No effusion. Anterior vertebral endplate spurring at multiple levels in the mid and lower thoracic spine. IMPRESSION: No acute cardiopulmonary disease. Electronically Signed   By: Lucrezia Europe M.D.   On: 08/24/2017 08:41     Assessment & Plan:  Plan  I have discontinued Marily Lente. Barca's B Complex Vitamins (VITAMIN B COMPLEX PO). I am also having him start on NONFORMULARY OR COMPOUNDED ITEM and EPINEPHrine. Additionally, I am having him maintain his (Flaxseed, Linseed, (FLAX SEED OIL PO)), Calcium  Carb-Cholecalciferol (CALCIUM 1000 + D PO), aspirin, multivitamin, Vitamin D, tamsulosin, levocetirizine, fluticasone, lisinopril, CoQ10, ezetimibe, traMADol, NONFORMULARY OR COMPOUNDED ITEM, carbamazepine, Eszopiclone, LORazepam, HYDROcodone-homatropine, NEXIUM, and fenofibrate.  Meds ordered this encounter  Medications  . NONFORMULARY OR COMPOUNDED ITEM    Sig: Personal training 2x a week  Dx history stroke    Dispense:  1 each    Refill:  0  . EPINEPHrine (EPIPEN 2-PAK) 0.3 mg/0.3 mL IJ SOAJ injection    Sig: Inject 0.3 mLs (0.3 mg total) into the muscle once for 1 dose.    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  Ann Held, DO

## 2017-09-26 NOTE — Assessment & Plan Note (Signed)
On zetia-- recheck labs

## 2017-09-26 NOTE — Patient Instructions (Signed)
Preventive Care 65 Years and Older, Male Preventive care refers to lifestyle choices and visits with your health care provider that can promote health and wellness. What does preventive care include?  A yearly physical exam. This is also called an annual well check.  Dental exams once or twice a year.  Routine eye exams. Ask your health care provider how often you should have your eyes checked.  Personal lifestyle choices, including: ? Daily care of your teeth and gums. ? Regular physical activity. ? Eating a healthy diet. ? Avoiding tobacco and drug use. ? Limiting alcohol use. ? Practicing safe sex. ? Taking low doses of aspirin every day. ? Taking vitamin and mineral supplements as recommended by your health care provider. What happens during an annual well check? The services and screenings done by your health care provider during your annual well check will depend on your age, overall health, lifestyle risk factors, and family history of disease. Counseling Your health care provider may ask you questions about your:  Alcohol use.  Tobacco use.  Drug use.  Emotional well-being.  Home and relationship well-being.  Sexual activity.  Eating habits.  History of falls.  Memory and ability to understand (cognition).  Work and work environment.  Screening You may have the following tests or measurements:  Height, weight, and BMI.  Blood pressure.  Lipid and cholesterol levels. These may be checked every 5 years, or more frequently if you are over 50 years old.  Skin check.  Lung cancer screening. You may have this screening every year starting at age 55 if you have a 30-pack-year history of smoking and currently smoke or have quit within the past 15 years.  Fecal occult blood test (FOBT) of the stool. You may have this test every year starting at age 50.  Flexible sigmoidoscopy or colonoscopy. You may have a sigmoidoscopy every 5 years or a colonoscopy every 10  years starting at age 50.  Prostate cancer screening. Recommendations will vary depending on your family history and other risks.  Hepatitis C blood test.  Hepatitis B blood test.  Sexually transmitted disease (STD) testing.  Diabetes screening. This is done by checking your blood sugar (glucose) after you have not eaten for a while (fasting). You may have this done every 1-3 years.  Abdominal aortic aneurysm (AAA) screening. You may need this if you are a current or former smoker.  Osteoporosis. You may be screened starting at age 70 if you are at high risk.  Talk with your health care provider about your test results, treatment options, and if necessary, the need for more tests. Vaccines Your health care provider may recommend certain vaccines, such as:  Influenza vaccine. This is recommended every year.  Tetanus, diphtheria, and acellular pertussis (Tdap, Td) vaccine. You may need a Td booster every 10 years.  Varicella vaccine. You may need this if you have not been vaccinated.  Zoster vaccine. You may need this after age 60.  Measles, mumps, and rubella (MMR) vaccine. You may need at least one dose of MMR if you were born in 1957 or later. You may also need a second dose.  Pneumococcal 13-valent conjugate (PCV13) vaccine. One dose is recommended after age 75.  Pneumococcal polysaccharide (PPSV23) vaccine. One dose is recommended after age 75.  Meningococcal vaccine. You may need this if you have certain conditions.  Hepatitis A vaccine. You may need this if you have certain conditions or if you travel or work in places where you   may be exposed to hepatitis A.  Hepatitis B vaccine. You may need this if you have certain conditions or if you travel or work in places where you may be exposed to hepatitis B.  Haemophilus influenzae type b (Hib) vaccine. You may need this if you have certain risk factors.  Talk to your health care provider about which screenings and vaccines  you need and how often you need them. This information is not intended to replace advice given to you by your health care provider. Make sure you discuss any questions you have with your health care provider. Document Released: 06/27/2015 Document Revised: 02/18/2016 Document Reviewed: 04/01/2015 Elsevier Interactive Patient Education  2018 Elsevier Inc.  

## 2017-09-26 NOTE — Assessment & Plan Note (Signed)
ghm utd Check labs See AVS 

## 2017-09-28 ENCOUNTER — Other Ambulatory Visit (INDEPENDENT_AMBULATORY_CARE_PROVIDER_SITE_OTHER): Payer: PPO

## 2017-09-28 DIAGNOSIS — E785 Hyperlipidemia, unspecified: Secondary | ICD-10-CM | POA: Diagnosis not present

## 2017-09-28 DIAGNOSIS — I10 Essential (primary) hypertension: Secondary | ICD-10-CM | POA: Diagnosis not present

## 2017-09-28 LAB — LIPID PANEL
CHOLESTEROL: 169 mg/dL (ref 0–200)
HDL: 55.3 mg/dL (ref >39.00)
LDL Cholesterol: 96 mg/dL (ref 0–99)
NonHDL: 113.55
Total CHOL/HDL Ratio: 3
Triglycerides: 90 mg/dL (ref 0.0–149.0)
VLDL: 18 mg/dL (ref 0.0–40.0)

## 2017-09-28 LAB — COMPREHENSIVE METABOLIC PANEL
ALBUMIN: 3.9 g/dL (ref 3.5–5.2)
ALK PHOS: 49 U/L (ref 39–117)
ALT: 21 U/L (ref 0–53)
AST: 20 U/L (ref 0–37)
BUN: 19 mg/dL (ref 6–23)
CO2: 27 mEq/L (ref 19–32)
CREATININE: 1.34 mg/dL (ref 0.40–1.50)
Calcium: 9.2 mg/dL (ref 8.4–10.5)
Chloride: 106 mEq/L (ref 96–112)
GFR: 55.21 mL/min — ABNORMAL LOW (ref >60.00)
GLUCOSE: 92 mg/dL (ref 70–99)
Potassium: 4.1 mEq/L (ref 3.5–5.1)
SODIUM: 142 meq/L (ref 135–145)
TOTAL PROTEIN: 7.3 g/dL (ref 6.0–8.3)
Total Bilirubin: 0.4 mg/dL (ref 0.2–1.2)

## 2017-09-28 LAB — CBC WITH DIFFERENTIAL/PLATELET
Basophils Absolute: 0.1 10*3/uL (ref 0.0–0.1)
Basophils Relative: 1 % (ref 0.0–3.0)
EOS ABS: 0.2 10*3/uL (ref 0.0–0.7)
Eosinophils Relative: 3.5 % (ref 0.0–5.0)
HCT: 38.9 % — ABNORMAL LOW (ref 39.0–52.0)
Hemoglobin: 13.2 g/dL (ref 13.0–17.0)
LYMPHS ABS: 1.9 10*3/uL (ref 0.7–4.0)
Lymphocytes Relative: 29 % (ref 12.0–46.0)
MCHC: 33.9 g/dL (ref 30.0–36.0)
MCV: 93.1 fl (ref 78.0–100.0)
MONO ABS: 0.5 10*3/uL (ref 0.1–1.0)
Monocytes Relative: 7.5 % (ref 3.0–12.0)
NEUTROS PCT: 59 % (ref 43.0–77.0)
Neutro Abs: 3.8 10*3/uL (ref 1.4–7.7)
Platelets: 241 10*3/uL (ref 150.0–400.0)
RBC: 4.18 Mil/uL — ABNORMAL LOW (ref 4.22–5.81)
RDW: 14.6 % (ref 11.5–15.5)
WBC: 6.4 10*3/uL (ref 4.0–10.5)

## 2017-09-29 ENCOUNTER — Inpatient Hospital Stay: Admit: 2017-09-29 | Discharge: 2017-09-30 | Primary: Internal Medicine

## 2017-09-29 DIAGNOSIS — D509 Iron deficiency anemia, unspecified: Secondary | ICD-10-CM

## 2017-09-29 DIAGNOSIS — D631 Anemia in chronic kidney disease: Secondary | ICD-10-CM

## 2017-09-29 DIAGNOSIS — E611 Iron deficiency: Principal | ICD-10-CM

## 2017-09-29 DIAGNOSIS — N184 Chronic kidney disease, stage 4 (severe): Secondary | ICD-10-CM

## 2017-09-29 MED ORDER — FERRIC CARBOXYMALTOSE IVPB
750 mg | Freq: Once | INTRAVENOUS | Status: CN
Start: 2017-09-29 — End: ?

## 2017-09-29 MED ORDER — SODIUM CHLORIDE FLUSH 0.9 % IV SOLN
10 mL | Status: DC | PRN
Start: 2017-09-29 — End: 2017-09-30

## 2017-09-29 MED ORDER — SODIUM CHLORIDE 0.9 % IV SOLN
Freq: Once | INTRAVENOUS | Status: CP
Start: 2017-09-29 — End: ?

## 2017-09-29 MED ORDER — EPINEPHRINE 0.3 MG/0.3ML IJ SOAJ
0.3 mg | INTRAMUSCULAR | Status: CN | PRN
Start: 2017-09-29 — End: ?

## 2017-09-29 MED ORDER — SODIUM CHLORIDE 0.9 % IV SOLN
Freq: Once | INTRAVENOUS | Status: CN
Start: 2017-09-29 — End: ?

## 2017-09-29 MED ORDER — HEPARIN SODIUM LOCK FLUSH 100 UNIT/ML IV SOLN
500 [IU] | Status: CN | PRN
Start: 2017-09-29 — End: ?

## 2017-09-29 MED ORDER — MEPERIDINE HCL 25 MG/ML IJ SOLN
25 mg | INTRAVENOUS | Status: CN | PRN
Start: 2017-09-29 — End: ?

## 2017-09-29 MED ORDER — FERRIC CARBOXYMALTOSE IVPB
750 mg | Freq: Once | INTRAVENOUS | Status: CP
Start: 2017-09-29 — End: ?

## 2017-09-29 MED ORDER — DIPHENHYDRAMINE HCL 50 MG/ML IJ SOLN
25 mg | INTRAVENOUS | Status: CN | PRN
Start: 2017-09-29 — End: ?

## 2017-09-29 MED ORDER — SODIUM CHLORIDE FLUSH 0.9 % IV SOLN
10 mL | Status: CN | PRN
Start: 2017-09-29 — End: ?

## 2017-09-29 MED ORDER — DEXAMETHASONE SODIUM PHOSPHATE 20 MG/5ML IJ SOLN
20 mg | INTRAVENOUS | Status: CN | PRN
Start: 2017-09-29 — End: ?

## 2017-09-29 MED ORDER — HYDROCORTISONE NA SUCCINATE PF 100 MG IJ SOLR
100 mg | INTRAVENOUS | Status: CN | PRN
Start: 2017-09-29 — End: ?

## 2017-10-05 ENCOUNTER — Encounter (HOSPITAL_COMMUNITY): Payer: Self-pay | Admitting: Psychiatry

## 2017-10-05 ENCOUNTER — Ambulatory Visit (INDEPENDENT_AMBULATORY_CARE_PROVIDER_SITE_OTHER): Payer: PPO | Admitting: Psychiatry

## 2017-10-05 VITALS — BP 162/84 | HR 68 | Ht 71.0 in | Wt 230.6 lb

## 2017-10-05 DIAGNOSIS — F0630 Mood disorder due to known physiological condition, unspecified: Secondary | ICD-10-CM

## 2017-10-05 DIAGNOSIS — F39 Unspecified mood [affective] disorder: Secondary | ICD-10-CM

## 2017-10-05 DIAGNOSIS — Z87891 Personal history of nicotine dependence: Secondary | ICD-10-CM

## 2017-10-05 DIAGNOSIS — Z818 Family history of other mental and behavioral disorders: Secondary | ICD-10-CM

## 2017-10-05 DIAGNOSIS — Z8673 Personal history of transient ischemic attack (TIA), and cerebral infarction without residual deficits: Secondary | ICD-10-CM

## 2017-10-05 MED ORDER — CARBAMAZEPINE ER 100 MG PO TB12: ORAL_TABLET | ORAL | 1 refills | Status: DC

## 2017-10-05 MED ORDER — LORAZEPAM 0.5 MG PO TABS: ORAL_TABLET | ORAL | 5 refills | Status: DC

## 2017-10-05 MED ORDER — ESZOPICLONE 3 MG PO TABS: ORAL_TABLET | ORAL | 1 refills | Status: DC

## 2017-10-05 NOTE — Progress Notes (Signed)
Patient ID: William [name], male   DOB: [birth year], 77 y.o.   MRN: [id number] [facility] MD Progress Note  10/05/2017 4:01 PM VICENT FEBLES  MRN:  [id number] Subjective:  Feeling good Principal Problem:  Today the patient is seen with his wife. He is at his baseline. He actually is doing very well. He denies any depression. This is an individual who one point was very emotionally distressed following brain damage following a stroke. He has is really felt very anxious suppress couldn't calm himself. In the first few years that I knew him eventually changed all his medicine now by herself taking a fixed moderate dose of Tegretol, all his Ativan and some Lunesta. At this time he denies depression. He denies anxiety. His wife is seen with its is doing great. He sleeps and eats well has good energy and enjoys the television. Watches sports event He reads the sports page every day. He goes to the gym twice a  Week. Goes for about half an hour. The patient's wife does all of cocaine and clearly does food preparation. Patient mainly as somewhat of a sedentary lifestyle. He watches a lot of television. He recently found to have some skin cancer and had placed her mood when his left leg. Other than is medically very healthy. He denies chest pain or shortness of breath. He is few stroke risk factors. He does not he is well-controlled blood pressure and cholesterol. He denies any neurological symptoms this time. Financially he is very stable. He is to children and 2 grandchildren all are doing well. The patient denies the use of alcohol or drugs. He is happy with life.  Past Medical History:  Past Medical History:  Diagnosis Date  . ANXIETY   . Arthritis    low back  . Bladder stone   . BPH (benign prostatic hypertrophy)   . Carotid artery occlusion   . 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 CVD (cerebrovascular disease)   . Eczema   . GERD (gastroesophageal reflux disease)   . History of carotid artery  stenosis    S/P BILATERAL CEA  . 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 Hyperlipidemia   . Hypertension   . Nocturia   . S/P carotid endarterectomy    BILATERAL ICA--  PATENT PER DUPLEX  05-19-2012  . Squamous cell carcinoma    right calf  . Stroke [facility]) 1996   pt states no weaknes but has severe anxiety  . Urinary frequency   . Vitamin D deficiency     Past Surgical History:  Procedure Laterality Date  . APPENDECTOMY  AS CHILD  . CARDIOVASCULAR STRESS TEST  03-27-2012  DR CRENSHAW   LOW RISK LEXISCAN STUDY-- PROBABLE NORMAL PERFUSION AND SOFT TISSUE ATTENUATION/  NO ISCHEMIA/ EF 51%  . CAROTID ENDARTERECTOMY Bilateral LEFT  11-12-2008  DR GREG HAYES   RIGHT ICA  2006  ([facility])  . CYSTOSCOPY WITH LITHOLAPAXY N/A 02/26/2013   Procedure: CYSTOSCOPY WITH LITHOLAPAXY;  Surgeon: Franchot Gallo, MD;  [facility];  Service: Urology;  Laterality: N/A;  . EYE SURGERY  Jan. 2016   cataract surgery both eyes  . INGUINAL HERNIA REPAIR Right 11-08-2006  . MASS EXCISION N/A 03/03/2016   Procedure: EXCISION OF BACK  MASS;  Surgeon: Stark Klein, MD;  Location: [place];  Service: General;  Laterality: N/A;  . MOHS SURGERY Left 1/ 2016   Dr Nevada Crane--  Basal cell  . PROSTATE SURGERY    . TRANSURETHRAL RESECTION OF PROSTATE N/A 02/26/2013   Procedure: TRANSURETHRAL RESECTION OF THE PROSTATE WITH GYRUS INSTRUMENTS;  Surgeon: Franchot Gallo, MD;  Location[facility];  Service: Urology;  Laterality: N/A;   Family History:  Family History  Problem Relation Age of Onset  . Heart disease Mother        CHF  . Bipolar disorder Mother   . Heart disease Father        CHF   Family Psychiatric  History:  Social History:  Social History   Substance and Sexual Activity  Alcohol Use Yes  . Alcohol/week: 0.0 oz   Comment: Occasional     Social History   Substance and Sexual Activity  Drug Use No     Social History   Socioeconomic History  . Marital status: Married    Spouse name: Not on file  . Number of children: 2  . Years of education: Not on file  . Highest education level: Not on file  Occupational History    Employer: Retired  Scientific laboratory technician  . Financial resource strain: Not on file  . Food insecurity:    Worry: Not on file    Inability: Not on file  . Transportation needs:    Medical: Not on file    Non-medical: Not on file  Tobacco Use  . Smoking status: Former Smoker    Packs/day: 2.00    Years: 40.00    Pack years: 80.00    Types: Cigarettes    Last attempt to quit: 02/15/2005    Years since quitting: 12.6  . Smokeless tobacco: Never Used  Substance and Sexual Activity  . Alcohol use: Yes    Alcohol/week: 0.0 oz    Comment: Occasional  . Drug use: No  . Sexual activity: Yes    Partners: Female  Lifestyle  . Physical activity:    Days per week: Not on file    Minutes per session: Not on file  . Stress: Not on file  Relationships  . Social connections:    Talks on phone: Not on file    Gets together: Not on file    Attends religious service: Not on file    Active member of club or organization: Not on file    Attends meetings of clubs or organizations: Not on file    Relationship status: Not on file  Other Topics Concern  . Not on file  Social History Narrative   Exercise--  Engineer, manufacturing everyday   Additional Social History:                         Sleep: Good  Appetite:  Good  Current Medications: Current Outpatient Medications  Medication Sig Dispense Refill  . aspirin 325 MG EC tablet Take 325 mg by mouth daily.    . Calcium Carb-Cholecalciferol (CALCIUM 1000 + D PO) Take by mouth daily.      . carbamazepine (TEGRETOL-XR) 100 MG 12 hr tablet 1 qam  1  qhs 180 tablet 1  . Cholecalciferol (VITAMIN D) 2000 UNITS CAPS Take by mouth.    . Eszopiclone 3 MG TABS TAKE 1 TABLET BY MOUTH AT BEDTIME AS NEEDED TAKE IMMEDIATELY BEFORE  BEDTIME 90 tablet 1  . ezetimibe (ZETIA) 10 MG tablet Take 1 tablet (10 mg total) by mouth daily. 90 tablet 3  . fenofibrate 160 MG tablet TAKE 1 TABLET BY MOUTH DAILY.  REPEAT LABS ARE DUE NOW 90 tablet 0  . Flaxseed, Linseed, (FLAX SEED OIL PO) Take by mouth daily.      . fluticasone (CUTIVATE) 0.05 % cream   3  . levocetirizine (XYZAL) 5 MG tablet Take 1 tablet (5 mg total) by mouth every evening. (Patient taking differently: Take 5 mg by mouth daily as needed. ) 30 tablet 5  . lisinopril (PRINIVIL,ZESTRIL) 20 MG tablet TAKE 1 TABLET (20 MG TOTAL) BY MOUTH DAILY. 90 tablet 3  . Multiple Vitamin (MULTIVITAMIN) tablet Take 1 tablet by mouth daily.    [name] NEXIUM 40 MG capsule TAKE 1 CAPSULE BY MOUTH ONCE DAILY AS NEEDED 90 capsule 0  . NONFORMULARY OR COMPOUNDED ITEM [facility]:  Onychomycosis Nail lacquer - Fluconazole 2%, Terbinafine 1%, DMSO, apply to affected area daily. 120 each 2  . NONFORMULARY OR COMPOUNDED ITEM Personal training 2x a week  Dx history stroke 1 each 0  . tamsulosin (FLOMAX) 0.4 MG CAPS capsule 1 po qd 30 capsule   . traMADol (ULTRAM) 50 MG tablet Take 1 tablet (50 mg total) by mouth every 12 (twelve) hours as needed. 10 tablet 0  . Coenzyme Q10 (COQ10) 200 MG CAPS Take 1 capsule by mouth daily.    [name] HYDROcodone-homatropine (HYCODAN) 5-1.5 MG/5ML syrup Take 5 mLs by mouth at bedtime as needed for cough. (Patient not taking: Reported on 10/05/2017) 100 mL 0  . LORazepam (ATIVAN) 0.5 MG tablet 2  Bid  1  qday 150 tablet 5   No current facility-administered medications for this visit.     Lab Results: No results found for this or any previous visit (from the past 48 hour(s)).  Physical Findings: AIMS:  , ,  ,  ,    CIWA:    COWS:     Musculoskeletal: Strength & Muscle Tone: within normal limits Gait & Station: normal Patient leans: Right  Psychiatric Specialty Exam: ROS  Blood pressure (!) 162/84, pulse 68, height 5\' 11"  (1.803 m), weight 230 lb 9.6 oz (104.6  kg).Body mass index is 32.16 kg/m.  General Appearance: Casual  Eye Contact::  Good  Speech:  Clear and Coherent  Volume:  Normal  Mood:  Dysphoric and Euthymic  Affect:  Appropriate  Thought Process:  Coherent  Orientation:  Full (Time, Place, and Person)  Thought Content:  WDL  Suicidal Thoughts:  No  Homicidal Thoughts:  No  Memory:  NA  Judgement:  Good  Insight:  Fair  Psychomotor Activity:  Normal  Concentration:  Good  Recall:  Good  Fund of Knowledge:Good  Language: Good  Akathisia:  No  Handed:  Right  AIMS (if indicated):     Assets:  Desire for Improvement  ADL's:  Intact  Cognition: WNL  Sleep:      Treatment Plan Summary: 10/05/2017, 4:01 PM  At this time the patient continue taking Tegretol as prescribed. He continues a moderate dose of Ativan. When his next visit we shall discussed the possibility of lowering his Ativan little. He's had no falls. Cognitively he is very intact. He shows no evidence of progressive memory/biopsy is very well. He takes [name] is very effective. The patient is positive and optimistic. His wife is healthy as is his family. This patient return to see me in 5 months.

## 2017-10-06 ENCOUNTER — Inpatient Hospital Stay: Admit: 2017-10-06 | Discharge: 2017-10-07 | Primary: Internal Medicine

## 2017-10-06 DIAGNOSIS — Z452 Encounter for adjustment and management of vascular access device: Principal | ICD-10-CM

## 2017-10-06 DIAGNOSIS — E611 Iron deficiency: Secondary | ICD-10-CM

## 2017-10-06 DIAGNOSIS — D649 Anemia, unspecified: Secondary | ICD-10-CM

## 2017-10-06 DIAGNOSIS — N184 Chronic kidney disease, stage 4 (severe): Secondary | ICD-10-CM

## 2017-10-06 DIAGNOSIS — D631 Anemia in chronic kidney disease: Secondary | ICD-10-CM

## 2017-10-06 MED ORDER — SODIUM CHLORIDE 0.9 % IV SOLN
Freq: Once | INTRAVENOUS | Status: CN
Start: 2017-10-06 — End: ?

## 2017-10-06 MED ORDER — FERRIC CARBOXYMALTOSE IVPB
750 mg | Freq: Once | INTRAVENOUS | Status: CN
Start: 2017-10-06 — End: ?

## 2017-10-06 MED ORDER — FERRIC CARBOXYMALTOSE IVPB
750 mg | Freq: Once | INTRAVENOUS | Status: CP
Start: 2017-10-06 — End: ?

## 2017-10-06 MED ORDER — DIPHENHYDRAMINE HCL 50 MG/ML IJ SOLN
25 mg | INTRAVENOUS | Status: CN | PRN
Start: 2017-10-06 — End: ?

## 2017-10-06 MED ORDER — DEXAMETHASONE SODIUM PHOSPHATE 20 MG/5ML IJ SOLN
20 mg | INTRAVENOUS | Status: CN | PRN
Start: 2017-10-06 — End: ?

## 2017-10-06 MED ORDER — EPINEPHRINE 0.3 MG/0.3ML IJ SOAJ
0.3 mg | INTRAMUSCULAR | Status: CN | PRN
Start: 2017-10-06 — End: ?

## 2017-10-06 MED ORDER — SODIUM CHLORIDE 0.9 % IV SOLN
Freq: Once | INTRAVENOUS | Status: CP
Start: 2017-10-06 — End: ?

## 2017-10-06 MED ORDER — SODIUM CHLORIDE FLUSH 0.9 % IV SOLN
10 mL | Status: CN | PRN
Start: 2017-10-06 — End: ?

## 2017-10-06 MED ORDER — HEPARIN SODIUM LOCK FLUSH 100 UNIT/ML IV SOLN
500 [IU] | Status: CN | PRN
Start: 2017-10-06 — End: ?

## 2017-10-06 MED ORDER — HYDROCORTISONE NA SUCCINATE PF 100 MG IJ SOLR
100 mg | INTRAVENOUS | Status: CN | PRN
Start: 2017-10-06 — End: ?

## 2017-10-06 MED ORDER — SODIUM CHLORIDE FLUSH 0.9 % IV SOLN
10 mL | Status: DC | PRN
Start: 2017-10-06 — End: 2017-10-07

## 2017-10-06 MED ORDER — MEPERIDINE HCL 25 MG/ML IJ SOLN
25 mg | INTRAVENOUS | Status: CN | PRN
Start: 2017-10-06 — End: ?

## 2017-10-11 DIAGNOSIS — Z08 Encounter for follow-up examination after completed treatment for malignant neoplasm: Secondary | ICD-10-CM | POA: Diagnosis not present

## 2017-10-11 DIAGNOSIS — Z85828 Personal history of other malignant neoplasm of skin: Secondary | ICD-10-CM | POA: Diagnosis not present

## 2017-10-17 ENCOUNTER — Encounter: Payer: Self-pay | Admitting: Podiatry

## 2017-10-17 ENCOUNTER — Ambulatory Visit: Payer: PPO | Admitting: Podiatry

## 2017-10-17 DIAGNOSIS — L603 Nail dystrophy: Secondary | ICD-10-CM | POA: Diagnosis not present

## 2017-10-17 NOTE — Progress Notes (Signed)
Subjective: 77 year old male presents the office today for follow-up evaluation of left hallux toenail thickening.  He is continuing the ointment daily.  His wife does feel there is been some improvement in the nail overall.  He did have a nail trimmed down today.  Denies any redness or drainage or any swelling to the toenail site.  He has no other concerns to his feet today. Denies any systemic complaints such as fevers, chills, nausea, vomiting. No acute changes since last appointment, and no other complaints at this time.   Objective: AAO x3, NAD DP/PT pulses palpable bilaterally, CRT less than 3 seconds Left hallux toenail appears to be hypertrophic, dystrophic and discolored distally however there is clearing on the proximal aspect.  There is no surrounding redness or drainage or any swelling or any clinical signs of infection noted today.  No open lesions or pre-ulcerative lesions.  No pain with calf compression, swelling, warmth, erythema  Assessment: Onychodystrophy with improvement  Plan: -All treatment options discussed with the patient including all alternatives, risks, complications.  -I debrided the left hallux toenail so that any complications of bleeding.  Continue the Nuvail daily.  Monitoring signs or symptoms of infection -Patient encouraged to call the office with any questions, concerns, change in symptoms.   Trula Slade DPM

## 2017-10-27 ENCOUNTER — Inpatient Hospital Stay: Admit: 2017-10-27 | Discharge: 2017-10-28 | Primary: Internal Medicine

## 2017-10-27 DIAGNOSIS — E611 Iron deficiency: Secondary | ICD-10-CM

## 2017-10-27 DIAGNOSIS — N184 Chronic kidney disease, stage 4 (severe): Principal | ICD-10-CM

## 2017-10-27 DIAGNOSIS — D631 Anemia in chronic kidney disease: Secondary | ICD-10-CM

## 2017-10-31 MED FILL — LORazepam 0.5 MG TABS: 0.5 | 30 days supply | Qty: 150 | Fill #3

## 2017-10-31 MED FILL — carBAMazepine ER 100 MG TB1: 100 | 30 days supply | Qty: 60 | Fill #8

## 2017-11-01 ENCOUNTER — Inpatient Hospital Stay: Attending: Hematology & Oncology | Primary: Internal Medicine

## 2017-11-02 ENCOUNTER — Inpatient Hospital Stay: Admit: 2017-11-02 | Discharge: 2017-11-03 | Primary: Internal Medicine

## 2017-11-02 ENCOUNTER — Ambulatory Visit: Admit: 2017-11-02 | Discharge: 2017-11-03 | Attending: Hematology & Oncology | Primary: Internal Medicine

## 2017-11-02 DIAGNOSIS — D649 Anemia, unspecified: Secondary | ICD-10-CM

## 2017-11-02 DIAGNOSIS — Z86010 Personal history of colonic polyps: Secondary | ICD-10-CM

## 2017-11-02 DIAGNOSIS — I4891 Unspecified atrial fibrillation: Secondary | ICD-10-CM

## 2017-11-02 DIAGNOSIS — E611 Iron deficiency: Secondary | ICD-10-CM

## 2017-11-02 DIAGNOSIS — F1010 Alcohol abuse, uncomplicated: Secondary | ICD-10-CM

## 2017-11-02 DIAGNOSIS — I509 Heart failure, unspecified: Secondary | ICD-10-CM

## 2017-11-02 DIAGNOSIS — Z7982 Long term (current) use of aspirin: Secondary | ICD-10-CM

## 2017-11-02 DIAGNOSIS — Z955 Presence of coronary angioplasty implant and graft: Secondary | ICD-10-CM

## 2017-11-02 DIAGNOSIS — Z79899 Other long term (current) drug therapy: Secondary | ICD-10-CM

## 2017-11-02 DIAGNOSIS — I6350 Cerebral infarction due to unspecified occlusion or stenosis of unspecified cerebral artery: Secondary | ICD-10-CM

## 2017-11-02 DIAGNOSIS — E1122 Type 2 diabetes mellitus with diabetic chronic kidney disease: Secondary | ICD-10-CM

## 2017-11-02 DIAGNOSIS — D631 Anemia in chronic kidney disease: Secondary | ICD-10-CM

## 2017-11-02 DIAGNOSIS — I639 Cerebral infarction, unspecified: Secondary | ICD-10-CM

## 2017-11-02 DIAGNOSIS — I890 Lymphedema, not elsewhere classified: Secondary | ICD-10-CM

## 2017-11-02 DIAGNOSIS — E119 Type 2 diabetes mellitus without complications: Secondary | ICD-10-CM

## 2017-11-02 DIAGNOSIS — I10 Essential (primary) hypertension: Secondary | ICD-10-CM

## 2017-11-02 DIAGNOSIS — Z809 Family history of malignant neoplasm, unspecified: Secondary | ICD-10-CM

## 2017-11-02 DIAGNOSIS — E785 Hyperlipidemia, unspecified: Secondary | ICD-10-CM

## 2017-11-02 DIAGNOSIS — I130 Hypertensive heart and chronic kidney disease with heart failure and stage 1 through stage 4 chronic kidney disease, or unspecified chronic kidney disease: Secondary | ICD-10-CM

## 2017-11-02 DIAGNOSIS — N184 Chronic kidney disease, stage 4 (severe): Secondary | ICD-10-CM

## 2017-11-02 DIAGNOSIS — Z9049 Acquired absence of other specified parts of digestive tract: Secondary | ICD-10-CM

## 2017-11-02 DIAGNOSIS — I2510 Atherosclerotic heart disease of native coronary artery without angina pectoris: Secondary | ICD-10-CM

## 2017-11-02 MED ORDER — EPOETIN ALFA 20000 UNIT/ML IJ SOLN
20000 [IU] | Freq: Once | SUBCUTANEOUS | Status: CP
Start: 2017-11-02 — End: ?

## 2017-11-02 MED ORDER — EPOETIN ALFA 20000 UNIT/ML IJ SOLN
20000 [IU] | Freq: Once | SUBCUTANEOUS | Status: CN
Start: 2017-11-02 — End: ?

## 2017-11-08 MED FILL — LISINOPRIL 20 MG TABLET: 20 | 30 days supply | Qty: 30 | Fill #4

## 2017-11-08 MED FILL — TAMSULOSIN HCL 0.4 MG CAP: 0.4 | 90 days supply | Qty: 90 | Fill #1

## 2017-11-16 ENCOUNTER — Inpatient Hospital Stay: Admit: 2017-11-16 | Discharge: 2017-11-17 | Primary: Internal Medicine

## 2017-11-16 DIAGNOSIS — D631 Anemia in chronic kidney disease: Secondary | ICD-10-CM

## 2017-11-16 DIAGNOSIS — E1122 Type 2 diabetes mellitus with diabetic chronic kidney disease: Secondary | ICD-10-CM

## 2017-11-16 DIAGNOSIS — Z79899 Other long term (current) drug therapy: Secondary | ICD-10-CM

## 2017-11-16 DIAGNOSIS — I130 Hypertensive heart and chronic kidney disease with heart failure and stage 1 through stage 4 chronic kidney disease, or unspecified chronic kidney disease: Secondary | ICD-10-CM

## 2017-11-16 DIAGNOSIS — D696 Thrombocytopenia, unspecified: Secondary | ICD-10-CM

## 2017-11-16 DIAGNOSIS — E611 Iron deficiency: Secondary | ICD-10-CM

## 2017-11-16 DIAGNOSIS — I509 Heart failure, unspecified: Secondary | ICD-10-CM

## 2017-11-16 DIAGNOSIS — N184 Chronic kidney disease, stage 4 (severe): Principal | ICD-10-CM

## 2017-11-16 MED ORDER — EPOETIN ALFA 20000 UNIT/ML IJ SOLN
20000 [IU] | Freq: Once | SUBCUTANEOUS | Status: CN
Start: 2017-11-16 — End: ?

## 2017-11-16 MED ORDER — EPOETIN ALFA 20000 UNIT/ML IJ SOLN
20000 [IU] | Freq: Once | SUBCUTANEOUS | Status: CP
Start: 2017-11-16 — End: ?

## 2017-11-21 ENCOUNTER — Inpatient Hospital Stay

## 2017-11-25 DIAGNOSIS — R2242 Localized swelling, mass and lump, left lower limb: Secondary | ICD-10-CM

## 2017-11-25 DIAGNOSIS — R772 Abnormality of alphafetoprotein: Secondary | ICD-10-CM

## 2017-11-25 DIAGNOSIS — R932 Abnormal findings on diagnostic imaging of liver and biliary tract: Principal | ICD-10-CM

## 2017-11-25 DIAGNOSIS — N189 Chronic kidney disease, unspecified: Secondary | ICD-10-CM

## 2017-11-25 DIAGNOSIS — D649 Anemia, unspecified: Secondary | ICD-10-CM

## 2017-11-25 DIAGNOSIS — D696 Thrombocytopenia, unspecified: Secondary | ICD-10-CM

## 2017-11-25 DIAGNOSIS — R2243 Localized swelling, mass and lump, lower limb, bilateral: Secondary | ICD-10-CM

## 2017-11-25 DIAGNOSIS — Z992 Dependence on renal dialysis: Secondary | ICD-10-CM

## 2017-11-25 DIAGNOSIS — R5383 Other fatigue: Secondary | ICD-10-CM

## 2017-11-28 MED FILL — carBAMazepine ER 100 MG TB1: 100 | 90 days supply | Qty: 180 | Fill #0

## 2017-11-28 MED FILL — LORazepam 0.5 MG TABS: 0.5 | 30 days supply | Qty: 150 | Fill #4

## 2017-11-30 ENCOUNTER — Ambulatory Visit: Primary: Internal Medicine

## 2017-12-01 ENCOUNTER — Telehealth: Payer: Self-pay | Admitting: Family Medicine

## 2017-12-01 NOTE — Telephone Encounter (Signed)
Received message that patient wanted to schedule AWV. Returned call to patient and spoke with wife [name]. She stated Tyse declined to schedule AWV for this year 2019, but will schedule AWV for next year 2020. SF

## 2017-12-08 ENCOUNTER — Encounter: Payer: Self-pay | Admitting: Podiatry

## 2017-12-08 ENCOUNTER — Ambulatory Visit (INDEPENDENT_AMBULATORY_CARE_PROVIDER_SITE_OTHER): Payer: PPO | Admitting: Podiatry

## 2017-12-08 DIAGNOSIS — L603 Nail dystrophy: Secondary | ICD-10-CM | POA: Diagnosis not present

## 2017-12-08 DIAGNOSIS — L600 Ingrowing nail: Secondary | ICD-10-CM | POA: Diagnosis not present

## 2017-12-08 NOTE — Patient Instructions (Signed)
Soak Instructions    THE DAY AFTER THE PROCEDURE  Place 1/4 cup of epsom salts in a quart of warm tap water.  Submerge your foot or feet with outer bandage intact for the initial soak; this will allow the bandage to become moist and wet for easy lift off.  Once you remove your bandage, continue to soak in the solution for 20 minutes.  This soak should be done twice a day.  Next, remove your foot or feet from solution, blot dry the affected area and cover.  You may use a band aid large enough to cover the area or use gauze and tape.  Apply other medications to the area as directed by the doctor such as polysporin neosporin.  IF YOUR SKIN BECOMES IRRITATED WHILE USING THESE INSTRUCTIONS, IT IS OKAY TO SWITCH TO  WHITE VINEGAR AND WATER. Or you may use antibacterial soap and water to keep the toe clean  Monitor for any signs/symptoms of infection. Call the office immediately if any occur or go directly to the emergency room. Call with any questions/concerns.   

## 2017-12-09 NOTE — Progress Notes (Signed)
Subjective: 77 year old male presents the office today for concerns of a possible ingrown toenail to the right big toe which is been tender and will occasionally get red at times but denies any drainage or pus coming from the area.  He states it only hurts with pressure in shoes.  He states that he was open to issue a new shoes he has no pain.  He has no other concerns. Denies any systemic complaints such as fevers, chills, nausea, vomiting. No acute changes since last appointment, and no other complaints at this time.   Objective: AAO x3, NAD- presents with his wife DP/PT pulses palpable bilaterally, CRT less than 3 seconds On the right hallux toenail there is incurvation of both medial lateral aspect of the nail corner there is tenderness mostly to the distal portion of the nail.  There is very minimal edema and faint erythema along the nail corner this is likely more from inflammation as opposed to infection.  There is no increase in warmth, drainage or pus or any ascending cellulitis.  The left hallux toenail appears to be doing well however there is dystrophy to the nail as well as yellow discoloration. No open lesions or pre-ulcerative lesions.  No pain with calf compression, swelling, warmth, erythema  Assessment: Right hallux ingrown toenail currently without signs of infection  Plan: -All treatment options discussed with the patient including all alternatives, risks, complications.  -We discussed treatment options both conservative as well as surgical.  He does not want to proceed with a partial nail avulsion at this time.  I did sharply debride the toenail with any complications of bleeding.  Recommend Epson salt soaks.  If he continues to have pain or if there is any signs or symptoms of infection will need to have a partial nail avulsion. He agrees with this plan.  -Did smooth the left hallux toenail without any complications or bleeding. -Patient encouraged to call the office with any  questions, concerns, change in symptoms.   Trula Slade DPM

## 2017-12-12 MED FILL — LISINOPRIL 20 MG TABLET: 20 | 30 days supply | Qty: 30 | Fill #5

## 2017-12-14 ENCOUNTER — Inpatient Hospital Stay: Primary: Internal Medicine

## 2017-12-19 ENCOUNTER — Other Ambulatory Visit: Payer: Self-pay | Admitting: Family Medicine

## 2017-12-20 MED FILL — FENOFIBRATE 160 MG TABLET: 160 | 90 days supply | Qty: 90 | Fill #0

## 2017-12-24 ENCOUNTER — Inpatient Hospital Stay

## 2017-12-26 MED FILL — ESZOPICLONE 3 MG TABS: 3 | 90 days supply | Qty: 90 | Fill #0

## 2017-12-27 ENCOUNTER — Encounter: Payer: Self-pay | Admitting: Podiatry

## 2017-12-27 ENCOUNTER — Ambulatory Visit: Payer: PPO | Admitting: Podiatry

## 2017-12-27 DIAGNOSIS — M79676 Pain in unspecified toe(s): Secondary | ICD-10-CM

## 2017-12-27 DIAGNOSIS — B351 Tinea unguium: Secondary | ICD-10-CM

## 2017-12-28 ENCOUNTER — Ambulatory Visit: Primary: Internal Medicine

## 2017-12-29 ENCOUNTER — Ambulatory Visit: Payer: PPO | Admitting: Podiatry

## 2017-12-29 ENCOUNTER — Inpatient Hospital Stay: Attending: Hematology & Oncology | Primary: Internal Medicine

## 2017-12-29 NOTE — Progress Notes (Signed)
Subjective: 77 y.o. returns the office today for painful, elongated, thickened toenails which he cannot trim himself. Denies any redness or drainage around the nails. The ingrown toenail has done well. Denies any acute changes since last appointment and no new complaints today. Denies any systemic complaints such as fevers, chills, nausea, vomiting.   PCP: Carollee Herter, Alferd Apa, DO  Objective: AAO 3, NAD DP/PT pulses palpable, CRT less than 3 seconds  Nails hypertrophic, dystrophic, elongated, brittle, discolored x 10. There is tenderness overlying the nails 1-5 bilaterally. There is no surrounding erythema or drainage along the nail sites. No open lesions or pre-ulcerative lesions are identified. No other areas of tenderness bilateral lower extremities. No overlying edema, erythema, increased warmth. No pain with calf compression, swelling, warmth, erythema.  Assessment: Patient presents with symptomatic onychomycosis  Plan: -Treatment options including alternatives, risks, complications were discussed -Nails sharply debrided 10 without complication/bleeding. -Discussed daily foot inspection. If there are any changes, to call the office immediately.  -Follow-up in 3 months or sooner if any problems are to arise. In the meantime, encouraged to call the office with any questions, concerns, changes symptoms.  Celesta Gentile, DPM

## 2018-01-02 MED FILL — EZETIMIBE 10 MG TABLET: 10 | 90 days supply | Qty: 90 | Fill #3

## 2018-01-03 DIAGNOSIS — K7469 Other cirrhosis of liver: Secondary | ICD-10-CM

## 2018-01-03 DIAGNOSIS — R52 Pain, unspecified: Secondary | ICD-10-CM

## 2018-01-03 DIAGNOSIS — L8990 Pressure ulcer of unspecified site, unspecified stage: Secondary | ICD-10-CM

## 2018-01-03 DIAGNOSIS — C220 Liver cell carcinoma: Principal | ICD-10-CM

## 2018-01-03 DIAGNOSIS — Z992 Dependence on renal dialysis: Secondary | ICD-10-CM

## 2018-01-03 DIAGNOSIS — N186 End stage renal disease: Secondary | ICD-10-CM

## 2018-01-03 DIAGNOSIS — R4182 Altered mental status, unspecified: Secondary | ICD-10-CM

## 2018-01-03 DIAGNOSIS — D649 Anemia, unspecified: Secondary | ICD-10-CM

## 2018-01-09 ENCOUNTER — Other Ambulatory Visit: Payer: Self-pay | Admitting: Family Medicine

## 2018-01-09 DIAGNOSIS — I10 Essential (primary) hypertension: Secondary | ICD-10-CM

## 2018-01-09 MED FILL — LISINOPRIL 20 MG TABLET: 20 | 90 days supply | Qty: 90 | Fill #0

## 2018-01-09 MED FILL — LORazepam 0.5 MG TABS: 0.5 | 30 days supply | Qty: 150 | Fill #0

## 2018-01-11 ENCOUNTER — Ambulatory Visit: Primary: Internal Medicine

## 2018-01-12 DEATH — deceased

## 2018-01-16 DIAGNOSIS — M1612 Unilateral primary osteoarthritis, left hip: Secondary | ICD-10-CM | POA: Diagnosis not present

## 2018-01-16 DIAGNOSIS — M7062 Trochanteric bursitis, left hip: Secondary | ICD-10-CM | POA: Diagnosis not present

## 2018-01-23 ENCOUNTER — Other Ambulatory Visit: Payer: Self-pay | Admitting: Internal Medicine

## 2018-01-24 MED FILL — NexIUM 40 MG CPDR: 40 | 90 days supply | Qty: 90 | Fill #0

## 2018-01-25 ENCOUNTER — Ambulatory Visit: Primary: Internal Medicine

## 2018-02-06 MED FILL — TAMSULOSIN HCL 0.4 MG CAP: 0.4 | 90 days supply | Qty: 90 | Fill #2

## 2018-02-08 ENCOUNTER — Encounter: Attending: Hematology & Oncology | Primary: Internal Medicine

## 2018-02-08 ENCOUNTER — Encounter: Primary: Internal Medicine

## 2018-02-14 MED FILL — LORazepam 0.5 MG TABS: 0.5 | 30 days supply | Qty: 150 | Fill #1

## 2018-02-27 MED FILL — carBAMazepine ER 100 MG TB1: 100 | 90 days supply | Qty: 180 | Fill #1

## 2018-02-28 ENCOUNTER — Ambulatory Visit: Payer: PPO | Admitting: Podiatry

## 2018-02-28 DIAGNOSIS — M79676 Pain in unspecified toe(s): Secondary | ICD-10-CM

## 2018-02-28 DIAGNOSIS — B351 Tinea unguium: Secondary | ICD-10-CM

## 2018-03-01 NOTE — Addendum Note (Signed)
Addended by: Cranford Mon R on: 03/01/2018 01:10 PM   Modules accepted: Orders

## 2018-03-01 NOTE — Progress Notes (Signed)
Subjective: 77 y.o. returns the office today for painful, elongated, thickened toenails which he cannot trim himself. Denies any redness or drainage around the nails. The ingrown toenail has done well. Denies any acute changes since last appointment and no new complaints today. Denies any systemic complaints such as fevers, chills, nausea, vomiting.   PCP: Carollee Herter, Alferd Apa, DO  Objective: AAO 3, NAD DP/PT pulses palpable, CRT less than 3 seconds  Nails hypertrophic, dystrophic, elongated, brittle, discolored x 10. There is tenderness overlying the nails 1-5 bilaterally. There is no surrounding erythema or drainage along the nail sites. No open lesions or pre-ulcerative lesions are identified. No other areas of tenderness bilateral lower extremities. No overlying edema, erythema, increased warmth. No pain with calf compression, swelling, warmth, erythema.  Assessment: Patient presents with symptomatic onychomycosis  Plan: -Treatment options including alternatives, risks, complications were discussed -Nails sharply debrided 10 without complication/bleeding. -Discussed daily foot inspection. If there are any changes, to call the office immediately.  -Follow-up in 3 months or sooner if any problems are to arise. In the meantime, encouraged to call the office with any questions, concerns, changes symptoms.  Celesta Gentile, DPM

## 2018-03-08 ENCOUNTER — Ambulatory Visit (INDEPENDENT_AMBULATORY_CARE_PROVIDER_SITE_OTHER): Payer: PPO | Admitting: Psychiatry

## 2018-03-08 DIAGNOSIS — Z87891 Personal history of nicotine dependence: Secondary | ICD-10-CM

## 2018-03-08 DIAGNOSIS — F0630 Mood disorder due to known physiological condition, unspecified: Secondary | ICD-10-CM | POA: Diagnosis not present

## 2018-03-08 MED ORDER — CARBAMAZEPINE ER 100 MG PO TB12: ORAL_TABLET | ORAL | 1 refills | Status: DC

## 2018-03-08 MED ORDER — ESZOPICLONE 3 MG PO TABS: ORAL_TABLET | ORAL | 1 refills | Status: DC

## 2018-03-08 MED ORDER — LORAZEPAM 0.5 MG PO TABS: ORAL_TABLET | ORAL | 5 refills | Status: DC

## 2018-03-08 MED FILL — ESZOPICLONE 3 MG TABS: 3 | 90 days supply | Qty: 90 | Fill #0

## 2018-03-08 NOTE — Progress Notes (Signed)
Patient ID: William [name], male   DOB: [birth year], 77 y.o.   MRN: [id number] [facility] MD Progress Note  03/08/2018 3:52 PM William [name]  MRN:  [id number] Subjective:  Feeling good Principal Problem:  Today the patient is doing well.  He is seen with his wife.  His mood is stable.  He shows no signs of explosive but he irritability.  He denies daily depression he denies anxiety.  The patient's skin cancer is resolved without problem.  He does have some left hip pain and is seeing his orthopedic doctor tomorrow.  Otherwise the patient denies shortness of breath chest pain or any physical complaints.  He denies any neurological complaints.  He denies daily depression.  He sleeps and eats very well and has good energy.  Patient goes to the gym twice a week.  He is very close with his wife.  Patient is going to see his children in the next year.  They are adults who live in [place] in [place].  He is looking forward to this visit.  Overall the patient has shown great stability.  He takes his medicines just as prescribed.  He denies any symptoms of psychosis.  He is not suicidal.  He is functioning extremely well.  His severity of severity is considered to be mild to moderate but is responded extremely well to the medications he is on.  The patient is positive and optimistic.  His thoughts are clear and organized.  He is a good sense of worth.  His wife says he is doing very well. Past Medical History:  Past Medical History:  Diagnosis Date  . ANXIETY   . Arthritis    low back  . Bladder stone   . BPH (benign prostatic hypertrophy)   . Carotid artery occlusion   . 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 CVD (cerebrovascular disease)   . Eczema   . GERD (gastroesophageal reflux disease)   . History of carotid artery stenosis    S/P BILATERAL CEA  . 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 Hyperlipidemia   . Hypertension   . Nocturia   . S/P carotid  endarterectomy    BILATERAL ICA--  PATENT PER DUPLEX  05-19-2012  . Squamous cell carcinoma    right calf  . Stroke [facility]) 1996   pt states no weaknes but has severe anxiety  . Urinary frequency   . Vitamin D deficiency     Past Surgical History:  Procedure Laterality Date  . APPENDECTOMY  AS CHILD  . CARDIOVASCULAR STRESS TEST  03-27-2012  DR CRENSHAW   LOW RISK LEXISCAN STUDY-- PROBABLE NORMAL PERFUSION AND SOFT TISSUE ATTENUATION/  NO ISCHEMIA/ EF 51%  . CAROTID ENDARTERECTOMY Bilateral LEFT  11-12-2008  DR GREG HAYES   RIGHT ICA  2006  ([facility])  . CYSTOSCOPY WITH LITHOLAPAXY N/A 02/26/2013   Procedure: CYSTOSCOPY WITH LITHOLAPAXY;  Surgeon: Franchot Gallo, MD;  Location: [facility];  Service: Urology;  Laterality: N/A;  . EYE SURGERY  Jan. 2016   cataract surgery both eyes  . INGUINAL HERNIA REPAIR Right 11-08-2006  . MASS EXCISION N/A 03/03/2016   Procedure: EXCISION OF BACK  MASS;  Surgeon: Stark Klein, MD;  Location: [place];  Service: General;  Laterality: N/A;  . 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 PROSTATE SURGERY    . TRANSURETHRAL RESECTION OF PROSTATE N/A 02/26/2013  Procedure: TRANSURETHRAL RESECTION OF THE PROSTATE WITH GYRUS INSTRUMENTS;  Surgeon: Franchot Gallo, MD;  [facility];  Service: Urology;  Laterality: N/A;   Family History:  Family History  Problem Relation Age of Onset  . Heart disease Mother        CHF  . Bipolar disorder Mother   . Heart disease Father        CHF   Family Psychiatric  History:  Social History:  Social History   Substance and Sexual Activity  Alcohol Use Yes  . Alcohol/week: 0.0 standard drinks   Comment: Occasional     Social History   Substance and Sexual Activity  Drug Use No    Social History   Socioeconomic History  . Marital status: Married    Spouse name: Not on file  . Number of children: 2  . Years of education: Not on file   . Highest education level: Not on file  Occupational History    Employer: Retired  Scientific laboratory technician  . Financial resource strain: Not on file  . Food insecurity:    Worry: Not on file    Inability: Not on file  . Transportation needs:    Medical: Not on file    Non-medical: Not on file  Tobacco Use  . Smoking status: Former Smoker    Packs/day: 2.00    Years: 40.00    Pack years: 80.00    Types: Cigarettes    Last attempt to quit: 02/15/2005    Years since quitting: 13.0  . Smokeless tobacco: Never Used  Substance and Sexual Activity  . Alcohol use: Yes    Alcohol/week: 0.0 standard drinks    Comment: Occasional  . Drug use: No  . Sexual activity: Yes    Partners: Female  Lifestyle  . Physical activity:    Days per week: Not on file    Minutes per session: Not on file  . Stress: Not on file  Relationships  . Social connections:    Talks on phone: Not on file    Gets together: Not on file    Attends religious service: Not on file    Active member of club or organization: Not on file    Attends meetings of clubs or organizations: Not on file    Relationship status: Not on file  Other Topics Concern  . Not on file  Social History Narrative   Exercise--  Engineer, manufacturing everyday   Additional Social History:                         Sleep: Good  Appetite:  Good  Current Medications: Current Outpatient Medications  Medication Sig Dispense Refill  . aspirin 325 MG EC tablet Take 325 mg by mouth daily.    . Calcium Carb-Cholecalciferol (CALCIUM 1000 + D PO) Take by mouth daily.      . carbamazepine (TEGRETOL-XR) 100 MG 12 hr tablet 1 qam  1  qhs 180 tablet 1  . Cholecalciferol (VITAMIN D) 2000 UNITS CAPS Take by mouth.    . Eszopiclone 3 MG TABS TAKE 1 TABLET BY MOUTH AT BEDTIME AS NEEDED TAKE IMMEDIATELY BEFORE BEDTIME 90 tablet 1  . ezetimibe (ZETIA) 10 MG tablet Take 1 tablet (10 mg total) by mouth daily. 90 tablet 3  . fenofibrate 160 MG tablet TAKE 1 TABLET  BY MOUTH DAILY. REPEAT LABS ARE DUE NOW 90 tablet 0  . Flaxseed, Linseed, (FLAX SEED OIL PO)  Take by mouth daily.      . fluticasone (CUTIVATE) 0.05 % cream   3  . levocetirizine (XYZAL) 5 MG tablet Take 1 tablet (5 mg total) by mouth every evening. (Patient taking differently: Take 5 mg by mouth daily as needed. ) 30 tablet 5  . lisinopril (PRINIVIL,ZESTRIL) 20 MG tablet TAKE 1 TABLET (20 MG TOTAL) BY MOUTH DAILY. 90 tablet 3  . LORazepam (ATIVAN) 0.5 MG tablet 2  Bid  1  qday 150 tablet 5  . Multiple Vitamin (MULTIVITAMIN) tablet Take 1 tablet by mouth daily.    [name] NEXIUM 40 MG capsule Take 1 capsule (40 mg total) by mouth daily as needed. 90 capsule 3  . NONFORMULARY OR COMPOUNDED ITEM [facility]:  Onychomycosis Nail lacquer - Fluconazole 2%, Terbinafine 1%, DMSO, apply to affected area daily. 120 each 2  . NONFORMULARY OR COMPOUNDED ITEM Personal training 2x a week  Dx history stroke 1 each 0  . tamsulosin (FLOMAX) 0.4 MG CAPS capsule 1 po qd 30 capsule    No current facility-administered medications for this visit.     Lab Results: No results found for this or any previous visit (from the past 48 hour(s)).  Physical Findings: AIMS:  , ,  ,  ,    CIWA:    COWS:     Musculoskeletal: Strength & Muscle Tone: within normal limits Gait & Station: normal Patient leans: Right  Psychiatric Specialty Exam: ROS  There were no vitals taken for this visit.There is no height or weight on file to calculate BMI.  General Appearance: Casual  Eye Contact::  Good  Speech:  Clear and Coherent  Volume:  Normal  Mood:  Dysphoric and Euthymic  Affect:  Appropriate  Thought Process:  Coherent  Orientation:  Full (Time, Place, and Person)  Thought Content:  WDL  Suicidal Thoughts:  No  Homicidal Thoughts:  No  Memory:  NA  Judgement:  Good  Insight:  Fair  Psychomotor Activity:  Normal  Concentration:  Good  Recall:  Good  Fund of Knowledge:Good  Language: Good  Akathisia:  No   Handed:  Right  AIMS (if indicated):     Assets:  Desire for Improvement  ADL's:  Intact  Cognition: WNL  Sleep:      Treatment Plan Summary: 03/08/2018, 3:52 PM  This patient is doing very well.  His first problem is that of mood instability.  This seems to be pretty much resolved.  This was secondary to a stroke.  Taking Tegretol and Ativan he is very stable.  He has had no falls.  His second problem is that of insomnia.  Presently takes [name] has a very good response.  His third problem seems that to be of hip pain.  Patient sees his orthopedic surgeon tomorrow.  Overall the patient is doing very well.  His blood work is always been very stable.  We reviewed his Tegretol level which seems to be in normal range.  Patient is not suicidal functioning extremely well and will return to see me in 5 months.

## 2018-03-09 DIAGNOSIS — M1612 Unilateral primary osteoarthritis, left hip: Secondary | ICD-10-CM | POA: Diagnosis not present

## 2018-03-09 DIAGNOSIS — M25562 Pain in left knee: Secondary | ICD-10-CM | POA: Diagnosis not present

## 2018-03-20 ENCOUNTER — Other Ambulatory Visit: Payer: Self-pay | Admitting: Family Medicine

## 2018-03-21 MED FILL — FENOFIBRATE 160 MG TABLET: 160 | 90 days supply | Qty: 90 | Fill #0

## 2018-03-27 ENCOUNTER — Ambulatory Visit (INDEPENDENT_AMBULATORY_CARE_PROVIDER_SITE_OTHER): Payer: PPO | Admitting: Family Medicine

## 2018-03-27 ENCOUNTER — Encounter: Payer: Self-pay | Admitting: Family Medicine

## 2018-03-27 VITALS — BP 137/66 | HR 59 | Temp 98.1°F | Resp 16 | Ht 71.0 in | Wt 228.8 lb

## 2018-03-27 DIAGNOSIS — Z23 Encounter for immunization: Secondary | ICD-10-CM | POA: Diagnosis not present

## 2018-03-27 DIAGNOSIS — I10 Essential (primary) hypertension: Secondary | ICD-10-CM

## 2018-03-27 DIAGNOSIS — R7301 Impaired fasting glucose: Secondary | ICD-10-CM | POA: Diagnosis not present

## 2018-03-27 DIAGNOSIS — E785 Hyperlipidemia, unspecified: Secondary | ICD-10-CM

## 2018-03-27 LAB — COMPREHENSIVE METABOLIC PANEL
ALK PHOS: 56 U/L (ref 39–117)
ALT: 18 U/L (ref 0–53)
AST: 15 U/L (ref 0–37)
Albumin: 4 g/dL (ref 3.5–5.2)
BILIRUBIN TOTAL: 0.3 mg/dL (ref 0.2–1.2)
BUN: 29 mg/dL — AB (ref 6–23)
CO2: 29 mEq/L (ref 19–32)
CREATININE: 1.8 mg/dL — AB (ref 0.40–1.50)
Calcium: 9.5 mg/dL (ref 8.4–10.5)
Chloride: 108 mEq/L (ref 96–112)
GFR: 39.22 mL/min — AB (ref >60.00)
GLUCOSE: 99 mg/dL (ref 70–99)
Potassium: 5.2 mEq/L — ABNORMAL HIGH (ref 3.5–5.1)
SODIUM: 142 meq/L (ref 135–145)
TOTAL PROTEIN: 7.2 g/dL (ref 6.0–8.3)

## 2018-03-27 LAB — LIPID PANEL
Cholesterol: 169 mg/dL (ref 0–200)
HDL: 50.8 mg/dL (ref >39.00)
LDL Cholesterol: 102 mg/dL — ABNORMAL HIGH (ref 0–99)
NONHDL: 118.67
Total CHOL/HDL Ratio: 3
Triglycerides: 82 mg/dL (ref 0.0–149.0)
VLDL: 16.4 mg/dL (ref 0.0–40.0)

## 2018-03-27 LAB — HEMOGLOBIN A1C: HEMOGLOBIN A1C: 5.8 % (ref 4.6–6.5)

## 2018-03-27 NOTE — Progress Notes (Signed)
Patient ID: William [name], male    DOB: Nov 09, 1940  Age: 77 y.o. MRN: [id number]    Subjective:  Subjective  HPI William [name] presents for f/u bp and cholesterol and he needs his flu shot.  No other complaints.    Review of Systems  Constitutional: Negative for chills and fever.  HENT: Negative for congestion and hearing loss.   Eyes: Negative for discharge.  Respiratory: Negative for cough, chest tightness and shortness of breath.   Cardiovascular: Negative for chest pain, palpitations and leg swelling.  Gastrointestinal: Negative for abdominal pain, blood in stool, constipation, diarrhea, nausea and vomiting.  Genitourinary: Negative for dysuria, frequency, hematuria and urgency.  Musculoskeletal: Negative for back pain and myalgias.  Skin: Negative for rash.  Allergic/Immunologic: Negative for environmental allergies.  Neurological: Negative for dizziness, weakness and headaches.  Hematological: Does not bruise/bleed easily.  Psychiatric/Behavioral: Negative for suicidal ideas. The patient is not nervous/anxious.     History Past Medical History:  Diagnosis Date  . ANXIETY   . Arthritis    low back  . Bladder stone   . BPH (benign prostatic hypertrophy)   . Carotid artery occlusion   . 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 CVD (cerebrovascular disease)   . Eczema   . GERD (gastroesophageal reflux disease)   . History of carotid artery stenosis    S/P BILATERAL CEA  . 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 Hyperlipidemia   . Hypertension   . Nocturia   . S/P carotid endarterectomy    BILATERAL ICA--  PATENT PER DUPLEX  05-19-2012  . Squamous cell carcinoma    right calf  . Stroke [facility]) 1996   pt states no weaknes but has severe anxiety  . Urinary frequency   . Vitamin D deficiency     He has a past surgical history that includes Carotid endarterectomy (Bilateral, LEFT  11-12-2008  DR GREG HAYES); Inguinal hernia  repair (Right, 11-08-2006); Cardiovascular stress test (03-27-2012  DR CRENSHAW); Appendectomy (AS CHILD); Cystoscopy with litholapaxy (N/A, 02/26/2013); Transurethral resection of prostate (N/A, 02/26/2013); Prostate surgery; Mohs surgery (Left, 1/ 2016); Eye surgery (Jan. 2016); and Mass excision (N/A, 03/03/2016).   His family history includes Bipolar disorder in his mother; Heart disease in his father and mother.He reports that he quit smoking about 13 years ago. His smoking use included cigarettes. He has a 80.00 pack-year smoking history. He has never used smokeless tobacco. He reports that he drinks alcohol. He reports that he does not use drugs.  Current Outpatient Medications on File Prior to Visit  Medication Sig Dispense Refill  . aspirin 325 MG EC tablet Take 325 mg by mouth daily.    . Calcium Carb-Cholecalciferol (CALCIUM 1000 + D PO) Take by mouth daily.      . carbamazepine (TEGRETOL-XR) 100 MG 12 hr tablet 1 qam  1  qhs 180 tablet 1  . Cholecalciferol (VITAMIN D) 2000 UNITS CAPS Take by mouth.    . Eszopiclone 3 MG TABS TAKE 1 TABLET BY MOUTH AT BEDTIME AS NEEDED TAKE IMMEDIATELY BEFORE BEDTIME 90 tablet 1  . ezetimibe (ZETIA) 10 MG tablet Take 1 tablet (10 mg total) by mouth daily. 90 tablet 3  . fenofibrate 160 MG tablet TAKE 1 TABLET BY MOUTH DAILY. REPEAT LABS ARE DUE NOW 90 tablet 0  . Flaxseed, Linseed, (FLAX SEED OIL PO) Take by mouth daily.      . fluticasone (CUTIVATE)  0.05 % cream   3  . lisinopril (PRINIVIL,ZESTRIL) 20 MG tablet TAKE 1 TABLET (20 MG TOTAL) BY MOUTH DAILY. 90 tablet 3  . LORazepam (ATIVAN) 0.5 MG tablet 2  Bid  1  qday 150 tablet 5  . Multiple Vitamin (MULTIVITAMIN) tablet Take 1 tablet by mouth daily.    [name] NEXIUM 40 MG capsule Take 1 capsule (40 mg total) by mouth daily as needed. 90 capsule 3  . NONFORMULARY OR COMPOUNDED ITEM [facility]:  Onychomycosis Nail lacquer - Fluconazole 2%, Terbinafine 1%, DMSO, apply to affected area daily. 120 each 2    . tamsulosin (FLOMAX) 0.4 MG CAPS capsule 1 po qd 30 capsule    No current facility-administered medications on file prior to visit.      Objective:  Objective  Physical Exam  Constitutional: He appears well-developed and well-nourished. No distress.  Cardiovascular: Normal rate, regular rhythm and normal heart sounds.  Pulmonary/Chest: Effort normal and breath sounds normal. No respiratory distress.  Psychiatric: He has a normal mood and affect. His behavior is normal. Judgment and thought content normal.  Nursing note and vitals reviewed.  BP 137/66   Pulse (!) 59   Temp 98.1 F (36.7 C) (Oral)   Resp 16   Ht 5\' 11"  (1.803 m)   Wt 228 lb 12.8 oz (103.8 kg)   SpO2 98%   BMI 31.91 kg/m  Wt Readings from Last 3 Encounters:  03/27/18 228 lb 12.8 oz (103.8 kg)  09/26/17 232 lb 9.6 oz (105.5 kg)  08/23/17 228 lb 4 oz (103.5 kg)     Lab Results  Component Value Date   WBC 6.4 09/28/2017   HGB 13.2 09/28/2017   HCT 38.9 (L) 09/28/2017   PLT 241.0 09/28/2017   GLUCOSE 99 03/27/2018   CHOL 169 03/27/2018   TRIG 82.0 03/27/2018   HDL 50.80 03/27/2018   LDLCALC 102 (H) 03/27/2018   ALT 18 03/27/2018   AST 15 03/27/2018   NA 142 03/27/2018   K 5.2 (H) 03/27/2018   CL 108 03/27/2018   CREATININE 1.80 (H) 03/27/2018   BUN 29 (H) 03/27/2018   CO2 29 03/27/2018   TSH 1.668 10/16/2013   PSA 2.10 12/24/2016   INR 1.0 11/08/2008   HGBA1C 5.8 03/27/2018    Dg Chest 2 View  Result Date: 08/24/2017 CLINICAL DATA:  Pt having cough and congestion for a week,nonsmoker EXAM: CHEST - 2 VIEW COMPARISON:  05/14/2015 FINDINGS: Stable linear scarring or subsegmental atelectasis laterally at the left lung base. Lungs otherwise clear. Heart size and mediastinal contours are within normal limits. No effusion. Anterior vertebral endplate spurring at multiple levels in the mid and lower thoracic spine. IMPRESSION: No acute cardiopulmonary disease. Electronically Signed   By: Lucrezia Europe M.D.    On: 08/24/2017 08:41     Assessment & Plan:  Plan  I have discontinued Kingdom Vanzanten. Farfan's levocetirizine. I am also having him maintain his (Flaxseed, Linseed, (FLAX SEED OIL PO)), Calcium Carb-Cholecalciferol (CALCIUM 1000 + D PO), aspirin, multivitamin, Vitamin D, tamsulosin, fluticasone, ezetimibe, NONFORMULARY OR COMPOUNDED ITEM, lisinopril, NEXIUM, carbamazepine, Eszopiclone, LORazepam, and fenofibrate.  No orders of the defined types were placed in this encounter.   Problem List Items Addressed This Visit      Unprioritized   HTN (hypertension) - Primary    Well controlled, no changes to meds. Encouraged heart healthy diet such as the DASH diet and exercise as tolerated.       Relevant Orders  Comprehensive metabolic panel (Completed)   Hyperlipidemia   Relevant Orders   Comprehensive metabolic panel (Completed)   Lipid panel (Completed)   Hyperlipidemia LDL goal <100    Tolerating statin, encouraged heart healthy diet, avoid trans fats, minimize simple carbs and saturated fats. Increase exercise as tolerated       Other Visit Diagnoses    Fasting hyperglycemia       Relevant Orders   Hemoglobin A1c (Completed)   Influenza vaccine administered       Relevant Orders   Flu vaccine HIGH DOSE PF (Fluzone High Dose) (Completed)      Follow-up: Return in about 6 months (around 09/26/2018), or if symptoms worsen or fail to improve, for annual exam, fasting.  Ann Held, DO

## 2018-03-27 NOTE — Patient Instructions (Signed)

## 2018-03-27 NOTE — Assessment & Plan Note (Deleted)
Well controlled, no changes to meds. Encouraged heart healthy diet such as the DASH diet and exercise as tolerated.  °

## 2018-03-27 NOTE — Assessment & Plan Note (Signed)
Tolerating statin, encouraged heart healthy diet, avoid trans fats, minimize simple carbs and saturated fats. Increase exercise as tolerated 

## 2018-03-27 NOTE — Assessment & Plan Note (Signed)
Well controlled, no changes to meds. Encouraged heart healthy diet such as the DASH diet and exercise as tolerated.  °

## 2018-03-28 DIAGNOSIS — M1612 Unilateral primary osteoarthritis, left hip: Secondary | ICD-10-CM | POA: Diagnosis not present

## 2018-03-29 ENCOUNTER — Encounter: Payer: Self-pay | Admitting: Family Medicine

## 2018-03-30 ENCOUNTER — Encounter: Payer: Self-pay | Admitting: Family Medicine

## 2018-03-30 ENCOUNTER — Other Ambulatory Visit: Payer: Self-pay

## 2018-03-30 DIAGNOSIS — R7989 Other specified abnormal findings of blood chemistry: Secondary | ICD-10-CM

## 2018-03-30 NOTE — Progress Notes (Signed)
c 

## 2018-04-03 ENCOUNTER — Other Ambulatory Visit: Payer: Self-pay | Admitting: Internal Medicine

## 2018-04-03 ENCOUNTER — Other Ambulatory Visit: Payer: Self-pay | Admitting: Family Medicine

## 2018-04-03 DIAGNOSIS — E785 Hyperlipidemia, unspecified: Secondary | ICD-10-CM

## 2018-04-03 MED FILL — NexIUM 40 MG CPDR: 40 | 90 days supply | Qty: 90 | Fill #1

## 2018-04-03 MED FILL — LORazepam 0.5 MG TABS: 0.5 | 30 days supply | Qty: 150 | Fill #2

## 2018-04-03 MED FILL — LISINOPRIL 20 MG TABLET: 20 | 90 days supply | Qty: 90 | Fill #1

## 2018-04-03 MED FILL — EZETIMIBE 10 MG TABLET: 10 | 90 days supply | Qty: 90 | Fill #0

## 2018-04-13 DIAGNOSIS — M7062 Trochanteric bursitis, left hip: Secondary | ICD-10-CM | POA: Diagnosis not present

## 2018-04-13 DIAGNOSIS — M1612 Unilateral primary osteoarthritis, left hip: Secondary | ICD-10-CM | POA: Diagnosis not present

## 2018-04-14 ENCOUNTER — Encounter: Payer: Self-pay | Admitting: Family Medicine

## 2018-04-14 ENCOUNTER — Other Ambulatory Visit (INDEPENDENT_AMBULATORY_CARE_PROVIDER_SITE_OTHER): Payer: PPO

## 2018-04-14 ENCOUNTER — Other Ambulatory Visit: Payer: Self-pay

## 2018-04-14 DIAGNOSIS — I10 Essential (primary) hypertension: Secondary | ICD-10-CM

## 2018-04-14 DIAGNOSIS — R7989 Other specified abnormal findings of blood chemistry: Secondary | ICD-10-CM

## 2018-04-14 DIAGNOSIS — E785 Hyperlipidemia, unspecified: Secondary | ICD-10-CM

## 2018-04-14 LAB — COMPREHENSIVE METABOLIC PANEL
ALBUMIN: 4 g/dL (ref 3.5–5.2)
ALK PHOS: 51 U/L (ref 39–117)
ALT: 21 U/L (ref 0–53)
AST: 19 U/L (ref 0–37)
BILIRUBIN TOTAL: 0.4 mg/dL (ref 0.2–1.2)
BUN: 22 mg/dL (ref 6–23)
CO2: 26 mEq/L (ref 19–32)
Calcium: 9.1 mg/dL (ref 8.4–10.5)
Chloride: 107 mEq/L (ref 96–112)
Creatinine, Ser: 1.52 mg/dL — ABNORMAL HIGH (ref 0.40–1.50)
GFR: 47.66 mL/min — AB (ref >60.00)
GLUCOSE: 101 mg/dL — AB (ref 70–99)
POTASSIUM: 4.2 meq/L (ref 3.5–5.1)
Sodium: 142 mEq/L (ref 135–145)
TOTAL PROTEIN: 6.9 g/dL (ref 6.0–8.3)

## 2018-04-17 DIAGNOSIS — M47896 Other spondylosis, lumbar region: Secondary | ICD-10-CM | POA: Diagnosis not present

## 2018-04-17 DIAGNOSIS — M5416 Radiculopathy, lumbar region: Secondary | ICD-10-CM | POA: Diagnosis not present

## 2018-04-19 MED FILL — traMADol HCL 50 MG TABS: 50 | 7 days supply | Qty: 14 | Fill #0

## 2018-04-27 ENCOUNTER — Ambulatory Visit (INDEPENDENT_AMBULATORY_CARE_PROVIDER_SITE_OTHER): Payer: PPO | Admitting: Family Medicine

## 2018-04-27 VITALS — BP 154/66 | HR 59 | Temp 98.0°F | Resp 16 | Ht 71.0 in | Wt 231.2 lb

## 2018-04-27 DIAGNOSIS — L700 Acne vulgaris: Secondary | ICD-10-CM | POA: Diagnosis not present

## 2018-04-27 NOTE — Progress Notes (Signed)
Patient ID: William ESKELSON, male    DOB: 07-26-1940  Age: 77 y.o. MRN: [id number]    Subjective:  Subjective  HPI TEAGUE GOYNES presents for spot on his back that is tender.  Its very itchy.    Review of Systems  Constitutional: Negative.   HENT: Negative for congestion, ear pain, hearing loss, nosebleeds, postnasal drip, rhinorrhea, sinus pressure, sneezing and tinnitus.   Eyes: Negative for photophobia, discharge, itching and visual disturbance.  Respiratory: Negative.   Cardiovascular: Negative.   Gastrointestinal: Negative for abdominal distention, abdominal pain, anal bleeding, blood in stool and constipation.  Endocrine: Negative.   Genitourinary: Negative.   Musculoskeletal: Negative.   Skin: Negative.        Growth on back x few weeks   Allergic/Immunologic: Negative.   Neurological: Negative for dizziness, weakness, light-headedness, numbness and headaches.  Psychiatric/Behavioral: Negative for agitation, confusion, decreased concentration, dysphoric mood, sleep disturbance and suicidal ideas. The patient is not nervous/anxious.     History Past Medical History:  Diagnosis Date  . ANXIETY   . Arthritis    low back  . Bladder stone   . BPH (benign prostatic hypertrophy)   . Carotid artery occlusion   . 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 CVD (cerebrovascular disease)   . Eczema   . GERD (gastroesophageal reflux disease)   . History of carotid artery stenosis    S/P BILATERAL CEA  . 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 Hyperlipidemia   . Hypertension   . Nocturia   . S/P carotid endarterectomy    BILATERAL ICA--  PATENT PER DUPLEX  05-19-2012  . Squamous cell carcinoma    right calf  . Stroke [facility]) 1996   pt states no weaknes but has severe anxiety  . Urinary frequency   . Vitamin D deficiency     He has a past surgical history that includes Carotid endarterectomy (Bilateral, LEFT  11-12-2008  DR GREG  HAYES); Inguinal hernia repair (Right, 11-08-2006); Cardiovascular stress test (03-27-2012  DR CRENSHAW); Appendectomy (AS CHILD); Cystoscopy with litholapaxy (N/A, 02/26/2013); Transurethral resection of prostate (N/A, 02/26/2013); Prostate surgery; Mohs surgery (Left, 1/ 2016); Eye surgery (Jan. 2016); and Mass excision (N/A, 03/03/2016).   His family history includes Bipolar disorder in his mother; Heart disease in his father and mother.He reports that he quit smoking about 13 years ago. His smoking use included cigarettes. He has a 80.00 pack-year smoking history. He has never used smokeless tobacco. He reports that he drinks alcohol. He reports that he does not use drugs.  Current Outpatient Medications on File Prior to Visit  Medication Sig Dispense Refill  . aspirin 325 MG EC tablet Take 325 mg by mouth daily.    . Calcium Carb-Cholecalciferol (CALCIUM 1000 + D PO) Take by mouth daily.      . carbamazepine (TEGRETOL-XR) 100 MG 12 hr tablet 1 qam  1  qhs 180 tablet 1  . Cholecalciferol (VITAMIN D) 2000 UNITS CAPS Take by mouth.    . Eszopiclone 3 MG TABS TAKE 1 TABLET BY MOUTH AT BEDTIME AS NEEDED TAKE IMMEDIATELY BEFORE BEDTIME 90 tablet 1  . ezetimibe (ZETIA) 10 MG tablet TAKE 1 TABLET (10 MG TOTAL) BY MOUTH DAILY. 90 tablet 3  . fenofibrate 160 MG tablet TAKE 1 TABLET BY MOUTH DAILY. REPEAT LABS ARE DUE NOW 90 tablet 0  . Flaxseed, Linseed, (FLAX SEED OIL PO) Take by mouth daily.      [name]  fluticasone (CUTIVATE) 0.05 % cream   3  . lisinopril (PRINIVIL,ZESTRIL) 20 MG tablet TAKE 1 TABLET (20 MG TOTAL) BY MOUTH DAILY. 90 tablet 3  . LORazepam (ATIVAN) 0.5 MG tablet 2  Bid  1  qday 150 tablet 5  . Multiple Vitamin (MULTIVITAMIN) tablet Take 1 tablet by mouth daily.    [name] NEXIUM 40 MG capsule Take 1 capsule (40 mg total) by mouth daily as needed. 90 capsule 3  . NONFORMULARY OR COMPOUNDED ITEM [facility]:  Onychomycosis Nail lacquer - Fluconazole 2%, Terbinafine 1%, DMSO, apply to affected  area daily. 120 each 2  . tamsulosin (FLOMAX) 0.4 MG CAPS capsule 1 po qd 30 capsule    No current facility-administered medications on file prior to visit.      Objective:  Objective  Physical Exam  Constitutional: He is oriented to person, place, and time. Vital signs are normal. He appears well-developed and well-nourished. He is sleeping.  HENT:  Head: Normocephalic and atraumatic.  Mouth/Throat: Oropharynx is clear and moist.  Eyes: Pupils are equal, round, and reactive to light. EOM are normal.  Neck: Normal range of motion. Neck supple. No thyromegaly present.  Cardiovascular: Normal rate and regular rhythm.  No murmur heard. Pulmonary/Chest: Effort normal and breath sounds normal. No respiratory distress. He has no wheezes. He has no rales. He exhibits no tenderness.  Musculoskeletal: He exhibits no edema or tenderness.  Neurological: He is alert and oriented to person, place, and time.  Skin: Skin is warm and dry.     Psychiatric: He has a normal mood and affect. His behavior is normal. Judgment and thought content normal.  Nursing note and vitals reviewed.  BP (!) 154/66   Pulse (!) 59   Temp 98 F (36.7 C) (Oral)   Resp 16   Ht 5\' 11"  (1.803 m)   Wt 231 lb 3.2 oz (104.9 kg)   SpO2 99%   BMI 32.25 kg/m  Wt Readings from Last 3 Encounters:  04/27/18 231 lb 3.2 oz (104.9 kg)  03/27/18 228 lb 12.8 oz (103.8 kg)  09/26/17 232 lb 9.6 oz (105.5 kg)     Lab Results  Component Value Date   WBC 6.4 09/28/2017   HGB 13.2 09/28/2017   HCT 38.9 (L) 09/28/2017   PLT 241.0 09/28/2017   GLUCOSE 101 (H) 04/14/2018   CHOL 169 03/27/2018   TRIG 82.0 03/27/2018   HDL 50.80 03/27/2018   LDLCALC 102 (H) 03/27/2018   ALT 21 04/14/2018   AST 19 04/14/2018   NA 142 04/14/2018   K 4.2 04/14/2018   CL 107 04/14/2018   CREATININE 1.52 (H) 04/14/2018   BUN 22 04/14/2018   CO2 26 04/14/2018   TSH 1.668 10/16/2013   PSA 2.10 12/24/2016   INR 1.0 11/08/2008   HGBA1C 5.8  03/27/2018    Dg Chest 2 View  Result Date: 08/24/2017 CLINICAL DATA:  Pt having cough and congestion for a week,nonsmoker EXAM: CHEST - 2 VIEW COMPARISON:  05/14/2015 FINDINGS: Stable linear scarring or subsegmental atelectasis laterally at the left lung base. Lungs otherwise clear. Heart size and mediastinal contours are within normal limits. No effusion. Anterior vertebral endplate spurring at multiple levels in the mid and lower thoracic spine. IMPRESSION: No acute cardiopulmonary disease. Electronically Signed   By: Lucrezia Europe M.D.   On: 08/24/2017 08:41     Assessment & Plan:  Plan  I am having Bruna Potter maintain his (Flaxseed, Linseed, (FLAX SEED OIL PO)), Calcium  Carb-Cholecalciferol (CALCIUM 1000 + D PO), aspirin, multivitamin, Vitamin D, tamsulosin, fluticasone, NONFORMULARY OR COMPOUNDED ITEM, lisinopril, NEXIUM, carbamazepine, Eszopiclone, LORazepam, fenofibrate, and ezetimibe.  No orders of the defined types were placed in this encounter.   Problem List Items Addressed This Visit      Unprioritized   Black head - Primary    Extracted successfully  No signs of infection  abx ointment with band aid in place         Follow-up: Return if symptoms worsen or fail to improve.  Ann Held, DO

## 2018-04-27 NOTE — Patient Instructions (Signed)
Warm compresses prn  rto prn

## 2018-04-28 ENCOUNTER — Encounter: Payer: Self-pay | Admitting: Family Medicine

## 2018-04-28 DIAGNOSIS — L700 Acne vulgaris: Secondary | ICD-10-CM | POA: Insufficient documentation

## 2018-04-28 NOTE — Assessment & Plan Note (Signed)
Extracted successfully  No signs of infection  abx ointment with band aid in place

## 2018-05-02 DIAGNOSIS — M47816 Spondylosis without myelopathy or radiculopathy, lumbar region: Secondary | ICD-10-CM | POA: Insufficient documentation

## 2018-05-02 MED FILL — LORazepam 0.5 MG TABS: 0.5 | 30 days supply | Qty: 150 | Fill #0

## 2018-05-02 MED FILL — TAMSULOSIN HCL 0.4 MG CAP: 0.4 | 90 days supply | Qty: 90 | Fill #3

## 2018-05-17 DIAGNOSIS — M545 Low back pain: Secondary | ICD-10-CM | POA: Diagnosis not present

## 2018-05-17 DIAGNOSIS — M5136 Other intervertebral disc degeneration, lumbar region: Secondary | ICD-10-CM | POA: Diagnosis not present

## 2018-05-17 DIAGNOSIS — G894 Chronic pain syndrome: Secondary | ICD-10-CM | POA: Diagnosis not present

## 2018-05-30 ENCOUNTER — Encounter: Payer: Self-pay | Admitting: Podiatry

## 2018-05-30 ENCOUNTER — Ambulatory Visit: Payer: PPO | Admitting: Podiatry

## 2018-05-30 DIAGNOSIS — M79676 Pain in unspecified toe(s): Secondary | ICD-10-CM | POA: Diagnosis not present

## 2018-05-30 DIAGNOSIS — B351 Tinea unguium: Secondary | ICD-10-CM | POA: Diagnosis not present

## 2018-05-31 MED FILL — carBAMazepine ER 100 MG TB1: 100 | 90 days supply | Qty: 180 | Fill #0

## 2018-06-08 NOTE — Progress Notes (Signed)
Subjective: 77 y.o. returns the office today for painful, elongated, thickened toenails which he cannot trim himself. Denies any redness or drainage around the nails. The ingrown toenail has done well. Denies any acute changes since last appointment and no new complaints today. Denies any systemic complaints such as fevers, chills, nausea, vomiting.   PCP: Carollee Herter, Alferd Apa, DO  Objective: AAO 3, NAD DP/PT pulses palpable, CRT less than 3 seconds  Nails hypertrophic, dystrophic, elongated, brittle, discolored x 10. There is tenderness overlying the nails 1-5 bilaterally. There is no surrounding erythema or drainage along the nail sites. No open lesions or pre-ulcerative lesions are identified. No pain with calf compression, swelling, warmth, erythema.  Assessment: Patient presents with symptomatic onychomycosis  Plan: -Treatment options including alternatives, risks, complications were discussed -Nails sharply debrided 10 without complication/bleeding. -Discussed daily foot inspection. If there are any changes, to call the office immediately.  -Follow-up in 3 months or sooner if any problems are to arise. In the meantime, encouraged to call the office with any questions, concerns, changes symptoms.  Celesta Gentile, DPM

## 2018-06-12 MED FILL — LORazepam 0.5 MG TABS: 0.5 | 30 days supply | Qty: 150 | Fill #1

## 2018-06-16 ENCOUNTER — Other Ambulatory Visit: Payer: Self-pay

## 2018-06-16 ENCOUNTER — Other Ambulatory Visit (INDEPENDENT_AMBULATORY_CARE_PROVIDER_SITE_OTHER): Payer: PPO

## 2018-06-16 DIAGNOSIS — I10 Essential (primary) hypertension: Secondary | ICD-10-CM | POA: Diagnosis not present

## 2018-06-16 DIAGNOSIS — E785 Hyperlipidemia, unspecified: Secondary | ICD-10-CM | POA: Diagnosis not present

## 2018-06-16 LAB — LIPID PANEL
CHOL/HDL RATIO: 3
CHOLESTEROL: 176 mg/dL (ref 0–200)
HDL: 56 mg/dL (ref >39.00)
LDL CALC: 105 mg/dL — AB (ref 0–99)
NonHDL: 120
Triglycerides: 75 mg/dL (ref 0.0–149.0)
VLDL: 15 mg/dL (ref 0.0–40.0)

## 2018-06-16 LAB — COMPREHENSIVE METABOLIC PANEL
ALT: 19 U/L (ref 0–53)
AST: 18 U/L (ref 0–37)
Albumin: 4.1 g/dL (ref 3.5–5.2)
Alkaline Phosphatase: 55 U/L (ref 39–117)
BUN: 26 mg/dL — AB (ref 6–23)
CHLORIDE: 105 meq/L (ref 96–112)
CO2: 25 meq/L (ref 19–32)
Calcium: 10 mg/dL (ref 8.4–10.5)
Creatinine, Ser: 1.7 mg/dL — ABNORMAL HIGH (ref 0.40–1.50)
GFR: 41.87 mL/min — ABNORMAL LOW (ref >60.00)
Glucose, Bld: 115 mg/dL — ABNORMAL HIGH (ref 70–99)
POTASSIUM: 4.4 meq/L (ref 3.5–5.1)
SODIUM: 141 meq/L (ref 135–145)
Total Bilirubin: 0.4 mg/dL (ref 0.2–1.2)
Total Protein: 7 g/dL (ref 6.0–8.3)

## 2018-06-20 ENCOUNTER — Other Ambulatory Visit: Payer: Self-pay | Admitting: Family Medicine

## 2018-06-20 MED FILL — ESZOPICLONE 3 MG TABS: 3 | 90 days supply | Qty: 90 | Fill #1

## 2018-06-21 MED FILL — FENOFIBRATE 160 MG TABLET: 160 | 90 days supply | Qty: 90 | Fill #0

## 2018-06-23 ENCOUNTER — Encounter: Payer: Self-pay | Admitting: Family Medicine

## 2018-06-24 ENCOUNTER — Other Ambulatory Visit: Payer: Self-pay | Admitting: Family Medicine

## 2018-06-24 DIAGNOSIS — R7989 Other specified abnormal findings of blood chemistry: Secondary | ICD-10-CM

## 2018-06-26 ENCOUNTER — Ambulatory Visit (HOSPITAL_BASED_OUTPATIENT_CLINIC_OR_DEPARTMENT_OTHER)
Admission: RE | Admit: 2018-06-26 | Discharge: 2018-06-26 | Disposition: A | Discharge status: Home / Self Care | Payer: PPO | Source: Ambulatory Visit | Attending: Family Medicine | Admitting: Family Medicine

## 2018-06-26 ENCOUNTER — Ambulatory Visit (INDEPENDENT_AMBULATORY_CARE_PROVIDER_SITE_OTHER): Payer: PPO | Admitting: Family Medicine

## 2018-06-26 ENCOUNTER — Encounter: Payer: Self-pay | Admitting: Family Medicine

## 2018-06-26 VITALS — BP 132/80 | HR 57 | Temp 97.7°F | Resp 16 | Ht 71.0 in | Wt 229.2 lb

## 2018-06-26 DIAGNOSIS — M546 Pain in thoracic spine: Secondary | ICD-10-CM

## 2018-06-26 DIAGNOSIS — M47814 Spondylosis without myelopathy or radiculopathy, thoracic region: Secondary | ICD-10-CM | POA: Diagnosis not present

## 2018-06-26 DIAGNOSIS — R7989 Other specified abnormal findings of blood chemistry: Secondary | ICD-10-CM

## 2018-06-26 MED ORDER — AMLODIPINE BESYLATE 5 MG PO TABS: 5.0000 mg | ORAL_TABLET | Freq: Every day | ORAL | 1 refills | Status: DC

## 2018-06-26 MED ORDER — TRAMADOL HCL 50 MG PO TABS: 50.0000 mg | ORAL_TABLET | Freq: Three times a day (TID) | ORAL | 0 refills | Status: DC | PRN

## 2018-06-26 MED FILL — AMLODIPINE BESYLATE 5 MG TA: 5 | 30 days supply | Qty: 30 | Fill #0

## 2018-06-26 MED FILL — traMADol HCL 50 MG TABS: 50 | 10 days supply | Qty: 30 | Fill #0

## 2018-06-26 NOTE — Patient Instructions (Signed)
Acute Back Pain, Adult  Acute back pain is sudden and usually short-lived. It is often caused by an injury to the muscles and tissues in the back. The injury may result from:   A muscle or ligament getting overstretched or torn (strained). Ligaments are tissues that connect bones to each other. Lifting something improperly can cause a back strain.   Wear and tear (degeneration) of the spinal disks. Spinal disks are circular tissue that provides cushioning between the bones of the spine (vertebrae).   Twisting motions, such as while playing sports or doing yard work.   A hit to the back.   Arthritis.  You may have a physical exam, lab tests, and imaging tests to find the cause of your pain. Acute back pain usually goes away with rest and home care.  Follow these instructions at home:  Managing pain, stiffness, and swelling   Take over-the-counter and prescription medicines only as told by your health care provider.   Your health care provider may recommend applying ice during the first 24-48 hours after your pain starts. To do this:  ? Put ice in a plastic bag.  ? Place a towel between your skin and the bag.  ? Leave the ice on for 20 minutes, 2-3 times a day.   If directed, apply heat to the affected area as often as told by your health care provider. Use the heat source that your health care provider recommends, such as a moist heat pack or a heating pad.  ? Place a towel between your skin and the heat source.  ? Leave the heat on for 20-30 minutes.  ? Remove the heat if your skin turns bright red. This is especially important if you are unable to feel pain, heat, or cold. You have a greater risk of getting burned.  Activity     Do not stay in bed. Staying in bed for more than 1-2 days can delay your recovery.   Sit up and stand up straight. Avoid leaning forward when you sit, or hunching over when you stand.  ? If you work at a desk, sit close to it so you do not need to lean over. Keep your chin tucked  in. Keep your neck drawn back, and keep your elbows bent at a right angle. Your arms should look like the letter "L."  ? Sit high and close to the steering wheel when you drive. Add lower back (lumbar) support to your car seat, if needed.   Take short walks on even surfaces as soon as you are able. Try to increase the length of time you walk each day.   Do not sit, drive, or stand in one place for more than 30 minutes at a time. Sitting or standing for long periods of time can put stress on your back.   Do not drive or use heavy machinery while taking prescription pain medicine.   Use proper lifting techniques. When you bend and lift, use positions that put less stress on your back:  ? Bend your knees.  ? Keep the load close to your body.  ? Avoid twisting.   Exercise regularly as told by your health care provider. Exercising helps your back heal faster and helps prevent back injuries by keeping muscles strong and flexible.   Work with a physical therapist to make a safe exercise program, as recommended by your health care provider. Do any exercises as told by your physical therapist.  Lifestyle   Maintain   a healthy weight. Extra weight puts stress on your back and makes it difficult to have good posture.   Avoid activities or situations that make you feel anxious or stressed. Stress and anxiety increase muscle tension and can make back pain worse. Learn ways to manage anxiety and stress, such as through exercise.  General instructions   Sleep on a firm mattress in a comfortable position. Try lying on your side with your knees slightly bent. If you lie on your back, put a pillow under your knees.   Follow your treatment plan as told by your health care provider. This may include:  ? Cognitive or behavioral therapy.  ? Acupuncture or massage therapy.  ? Meditation or yoga.  Contact a health care provider if:   You have pain that is not relieved with rest or medicine.   You have increasing pain going down  into your legs or buttocks.   Your pain does not improve after 2 weeks.   You have pain at night.   You lose weight without trying.   You have a fever or chills.  Get help right away if:   You develop new bowel or bladder control problems.   You have unusual weakness or numbness in your arms or legs.   You develop nausea or vomiting.   You develop abdominal pain.   You feel faint.  Summary   Acute back pain is sudden and usually short-lived.   Use proper lifting techniques. When you bend and lift, use positions that put less stress on your back.   Take over-the-counter and prescription medicines and apply heat or ice as directed by your health care provider.  This information is not intended to replace advice given to you by your health care provider. Make sure you discuss any questions you have with your health care provider.  Document Released: 05/31/2005 Document Revised: 01/05/2018 Document Reviewed: 01/12/2017  Elsevier Interactive Patient Education  2019 [employer]

## 2018-06-26 NOTE — Progress Notes (Signed)
Patient ID: William [name], male    DOB: 12/24/1940  Age: 78 y.o. MRN: [id number]    Subjective:  Subjective  HPI William [name] presents for mid thoracic pain--- occurred suddenly -- no known injury.  Review of Systems  Constitutional: Negative for appetite change, diaphoresis, fatigue and unexpected weight change.  Eyes: Negative for pain, redness and visual disturbance.  Respiratory: Negative for cough, chest tightness, shortness of breath and wheezing.   Cardiovascular: Negative for chest pain, palpitations and leg swelling.  Endocrine: Negative for cold intolerance, heat intolerance, polydipsia, polyphagia and polyuria.  Genitourinary: Negative for difficulty urinating, dysuria and frequency.  Musculoskeletal: Positive for back pain. Negative for gait problem.  Neurological: Negative for dizziness, light-headedness, numbness and headaches.    History Past Medical History:  Diagnosis Date  . ANXIETY   . Arthritis    low back  . Bladder stone   . BPH (benign prostatic hypertrophy)   . Carotid artery occlusion   . 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 CVD (cerebrovascular disease)   . Eczema   . GERD (gastroesophageal reflux disease)   . History of carotid artery stenosis    S/P BILATERAL CEA  . 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 Hyperlipidemia   . Hypertension   . Nocturia   . S/P carotid endarterectomy    BILATERAL ICA--  PATENT PER DUPLEX  05-19-2012  . Squamous cell carcinoma    right calf  . Stroke [facility]) 1996   pt states no weaknes but has severe anxiety  . Urinary frequency   . Vitamin D deficiency     He has a past surgical history that includes Carotid endarterectomy (Bilateral, LEFT  11-12-2008  DR GREG HAYES); Inguinal hernia repair (Right, 11-08-2006); Cardiovascular stress test (03-27-2012  DR CRENSHAW); Appendectomy (AS CHILD); Cystoscopy with litholapaxy (N/A, 02/26/2013); Transurethral resection of  prostate (N/A, 02/26/2013); Prostate surgery; Mohs surgery (Left, 1/ 2016); Eye surgery (Jan. 2016); and Mass excision (N/A, 03/03/2016).   His family history includes Bipolar disorder in his mother; Heart disease in his father and mother.He reports that he quit smoking about 13 years ago. His smoking use included cigarettes. He has a 80.00 pack-year smoking history. He has never used smokeless tobacco. He reports current alcohol use. He reports that he does not use drugs.  Current Outpatient Medications on File Prior to Visit  Medication Sig Dispense Refill  . aspirin 325 MG EC tablet Take 325 mg by mouth daily.    . Calcium Carb-Cholecalciferol (CALCIUM 1000 + D PO) Take by mouth daily.      . carbamazepine (TEGRETOL-XR) 100 MG 12 hr tablet 1 qam  1  qhs 180 tablet 1  . Cholecalciferol (VITAMIN D) 2000 UNITS CAPS Take by mouth.    . Eszopiclone 3 MG TABS TAKE 1 TABLET BY MOUTH AT BEDTIME AS NEEDED TAKE IMMEDIATELY BEFORE BEDTIME 90 tablet 1  . ezetimibe (ZETIA) 10 MG tablet TAKE 1 TABLET (10 MG TOTAL) BY MOUTH DAILY. 90 tablet 3  . fenofibrate 160 MG tablet TAKE 1 TABLET BY MOUTH DAILY. REPEAT LABS ARE DUE NOW 90 tablet 1  . Flaxseed, Linseed, (FLAX SEED OIL PO) Take by mouth daily.      . fluticasone (CUTIVATE) 0.05 % cream   3  . lisinopril (PRINIVIL,ZESTRIL) 20 MG tablet TAKE 1 TABLET (20 MG TOTAL) BY MOUTH DAILY. 90 tablet 3  . LORazepam (ATIVAN) 0.5 MG tablet 2  Bid  1  qday 150 tablet 5  . Multiple Vitamin (MULTIVITAMIN) tablet Take 1 tablet by mouth daily.    [name] NEXIUM 40 MG capsule Take 1 capsule (40 mg total) by mouth daily as needed. 90 capsule 3  . NONFORMULARY OR COMPOUNDED ITEM [facility]:  Onychomycosis Nail lacquer - Fluconazole 2%, Terbinafine 1%, DMSO, apply to affected area daily. 120 each 2  . tamsulosin (FLOMAX) 0.4 MG CAPS capsule 1 po qd 30 capsule    No current facility-administered medications on file prior to visit.      Objective:  Objective  Physical  Exam Vitals signs and nursing note reviewed.  Constitutional:      General: He is sleeping.     Appearance: He is well-developed.  HENT:     Head: Normocephalic and atraumatic.  Eyes:     Pupils: Pupils are equal, round, and reactive to light.  Neck:     Musculoskeletal: Normal range of motion and neck supple.     Thyroid: No thyromegaly.  Cardiovascular:     Rate and Rhythm: Normal rate and regular rhythm.     Heart sounds: No murmur.  Pulmonary:     Effort: Pulmonary effort is normal. No respiratory distress.     Breath sounds: Normal breath sounds. No wheezing or rales.  Chest:     Chest wall: No tenderness.  Musculoskeletal:        General: Tenderness present. No swelling, deformity or signs of injury.  Skin:    General: Skin is warm and dry.  Neurological:     Mental Status: He is oriented to person, place, and time.  Psychiatric:        Behavior: Behavior normal.        Thought Content: Thought content normal.        Judgment: Judgment normal.    BP 132/80 (BP Location: Right Arm, Cuff Size: Large)   Pulse (!) 57   Temp 97.7 F (36.5 C) (Oral)   Resp 16   Ht 5\' 11"  (1.803 m)   Wt 229 lb 3.2 oz (104 kg)   SpO2 97%   BMI 31.97 kg/m  Wt Readings from Last 3 Encounters:  06/26/18 229 lb 3.2 oz (104 kg)  04/27/18 231 lb 3.2 oz (104.9 kg)  03/27/18 228 lb 12.8 oz (103.8 kg)     Lab Results  Component Value Date   WBC 6.4 09/28/2017   HGB 13.2 09/28/2017   HCT 38.9 (L) 09/28/2017   PLT 241.0 09/28/2017   GLUCOSE 115 (H) 06/16/2018   CHOL 176 06/16/2018   TRIG 75.0 06/16/2018   HDL 56.00 06/16/2018   LDLCALC 105 (H) 06/16/2018   ALT 19 06/16/2018   AST 18 06/16/2018   NA 141 06/16/2018   K 4.4 06/16/2018   CL 105 06/16/2018   CREATININE 1.70 (H) 06/16/2018   BUN 26 (H) 06/16/2018   CO2 25 06/16/2018   TSH 1.668 10/16/2013   PSA 2.10 12/24/2016   INR 1.0 11/08/2008   HGBA1C 5.8 03/27/2018    Dg Chest 2 View  Result Date: 08/24/2017 CLINICAL  DATA:  Pt having cough and congestion for a week,nonsmoker EXAM: CHEST - 2 VIEW COMPARISON:  05/14/2015 FINDINGS: Stable linear scarring or subsegmental atelectasis laterally at the left lung base. Lungs otherwise clear. Heart size and mediastinal contours are within normal limits. No effusion. Anterior vertebral endplate spurring at multiple levels in the mid and lower thoracic spine. IMPRESSION: No acute cardiopulmonary disease. Electronically Signed   By:  Lucrezia Europe M.D.   On: 08/24/2017 08:41     Assessment & Plan:  Plan  I am having Bruna Potter start on amLODipine and traMADol. I am also having him maintain his (Flaxseed, Linseed, (FLAX SEED OIL PO)), Calcium Carb-Cholecalciferol (CALCIUM 1000 + D PO), aspirin, multivitamin, Vitamin D, tamsulosin, fluticasone, NONFORMULARY OR COMPOUNDED ITEM, lisinopril, NEXIUM, carbamazepine, Eszopiclone, LORazepam, ezetimibe, and fenofibrate.  Meds ordered this encounter  Medications  . amLODipine (NORVASC) 5 MG tablet    Sig: Take 1 tablet (5 mg total) by mouth daily.    Dispense:  30 tablet    Refill:  1  . traMADol (ULTRAM) 50 MG tablet    Sig: Take 1 tablet (50 mg total) by mouth every 8 (eight) hours as needed.    Dispense:  30 tablet    Refill:  0    Problem List Items Addressed This Visit    None    Visit Diagnoses    Thoracic spine pain    -  Primary   Relevant Medications   traMADol (ULTRAM) 50 MG tablet   Other Relevant Orders   DG Thoracic Spine 2 View   Elevated serum creatinine       Relevant Orders   Basic metabolic panel      Follow-up: Return in about 3 weeks (around 07/17/2018), or if symptoms worsen or fail to improve, for bp and lab .  Ann Held, DO

## 2018-06-30 ENCOUNTER — Encounter: Payer: Self-pay | Admitting: Family Medicine

## 2018-07-04 MED FILL — EZETIMIBE 10 MG TABS: 10 | 90 days supply | Qty: 90 | Fill #1

## 2018-07-18 ENCOUNTER — Other Ambulatory Visit (INDEPENDENT_AMBULATORY_CARE_PROVIDER_SITE_OTHER): Payer: PPO

## 2018-07-18 ENCOUNTER — Ambulatory Visit (INDEPENDENT_AMBULATORY_CARE_PROVIDER_SITE_OTHER): Payer: PPO | Admitting: Internal Medicine

## 2018-07-18 VITALS — BP 130/74 | HR 74

## 2018-07-18 DIAGNOSIS — R7989 Other specified abnormal findings of blood chemistry: Secondary | ICD-10-CM

## 2018-07-18 DIAGNOSIS — I10 Essential (primary) hypertension: Secondary | ICD-10-CM

## 2018-07-18 LAB — BASIC METABOLIC PANEL
BUN: 29 mg/dL — ABNORMAL HIGH (ref 6–23)
CO2: 25 meq/L (ref 19–32)
Calcium: 9.7 mg/dL (ref 8.4–10.5)
Chloride: 103 mEq/L (ref 96–112)
Creatinine, Ser: 1.66 mg/dL — ABNORMAL HIGH (ref 0.40–1.50)
GFR: 40.48 mL/min — ABNORMAL LOW (ref >60.00)
Glucose, Bld: 98 mg/dL (ref 70–99)
Potassium: 4.1 mEq/L (ref 3.5–5.1)
Sodium: 139 mEq/L (ref 135–145)

## 2018-07-18 MED ORDER — AMLODIPINE BESYLATE 5 MG PO TABS: 5.0000 mg | ORAL_TABLET | Freq: Every day | ORAL | 1 refills | Status: DC

## 2018-07-18 MED FILL — LORazepam 0.5 MG TABS: 0.5 | 30 days supply | Qty: 150 | Fill #2

## 2018-07-18 NOTE — Progress Notes (Signed)
Patient her for blood pressure check per Dr. Carollee Herter.  Patient did take medication this morning.  Patient is currently taking amlodipine 5mg  qd.  He has not missed any doses.  Next appointment with Dr. Carollee Herter is 10/05/18.  Per Dr. Larose Kells, continue with same dose amlodipine 5mg  qd and to follow up with Dr. Carollee Herter at next appointment.  Refill sent in.   Kathlene November, MD

## 2018-07-19 MED FILL — AMLODIPINE BESYLATE 5 MG TA: 5 | 90 days supply | Qty: 90 | Fill #0

## 2018-08-01 ENCOUNTER — Ambulatory Visit: Payer: PPO | Admitting: Podiatry

## 2018-08-01 ENCOUNTER — Encounter: Payer: Self-pay | Admitting: Podiatry

## 2018-08-01 DIAGNOSIS — M79676 Pain in unspecified toe(s): Secondary | ICD-10-CM

## 2018-08-01 DIAGNOSIS — B351 Tinea unguium: Secondary | ICD-10-CM

## 2018-08-02 NOTE — Progress Notes (Signed)
Subjective: 78 y.o. returns the office today for painful, elongated, thickened toenails which he cannot trim himself. Denies any redness or drainage around the nails.  He is continue with a compound cream for the nail fungus.  Denies any acute changes since last appointment and no new complaints today. Denies any systemic complaints such as fevers, chills, nausea, vomiting.   PCP: Carollee Herter, Alferd Apa, DO  Objective: AAO 3, NAD DP/PT pulses palpable, CRT less than 3 seconds  Nails hypertrophic, dystrophic, elongated, brittle, discolored x 10. There is tenderness overlying the nails 1-5 bilaterally. There is no surrounding erythema or drainage along the nail sites. No open lesions or pre-ulcerative lesions are identified. No pain with calf compression, swelling, warmth, erythema.  Assessment: Patient presents with symptomatic onychomycosis  Plan: -Treatment options including alternatives, risks, complications were discussed -Nails sharply debrided 10 without complication/bleeding.  There is some improvement in color to the toenails and starting the topical ointment.  Would continue with this. -Discussed daily foot inspection. If there are any changes, to call the office immediately.  -Follow-up in 3 months or sooner if any problems are to arise. In the meantime, encouraged to call the office with any questions, concerns, changes symptoms.  Celesta Gentile, DPM

## 2018-08-05 ENCOUNTER — Encounter: Payer: Self-pay | Admitting: Family Medicine

## 2018-08-08 MED FILL — TAMSULOSIN HCL 0.4 MG CAP: 0.4 | 90 days supply | Qty: 90 | Fill #0

## 2018-08-10 ENCOUNTER — Ambulatory Visit (INDEPENDENT_AMBULATORY_CARE_PROVIDER_SITE_OTHER): Payer: PPO | Admitting: Psychiatry

## 2018-08-10 ENCOUNTER — Encounter (HOSPITAL_COMMUNITY): Payer: Self-pay | Admitting: Psychiatry

## 2018-08-10 VITALS — BP 123/80 | Ht 70.5 in | Wt 230.2 lb

## 2018-08-10 DIAGNOSIS — F0630 Mood disorder due to known physiological condition, unspecified: Secondary | ICD-10-CM

## 2018-08-10 DIAGNOSIS — F4324 Adjustment disorder with disturbance of conduct: Secondary | ICD-10-CM | POA: Diagnosis not present

## 2018-08-10 MED ORDER — CARBAMAZEPINE ER 100 MG PO TB12: ORAL_TABLET | ORAL | 2 refills | Status: DC

## 2018-08-10 MED ORDER — ESZOPICLONE 3 MG PO TABS: ORAL_TABLET | ORAL | 2 refills | Status: DC

## 2018-08-10 MED ORDER — LORAZEPAM 0.5 MG PO TABS: ORAL_TABLET | ORAL | 5 refills | Status: DC

## 2018-08-10 NOTE — Addendum Note (Signed)
Addended by: Norma Fredrickson on: 08/10/2018 04:46 PM   Modules accepted: Orders

## 2018-08-10 NOTE — Progress Notes (Signed)
Patient ID: William [name], male   DOB: [birth year], 78 y.o.   MRN: [id number] [facility] MD Progress Note  08/10/2018 1:41 PM William [name]  MRN:  [id number] Subjective:  Feeling good Principal Problem: Mild neurocognitive disorder Today the patient is doing well.  He is seen with his wife.  His mood is stable.  His hip and back pain are gone since she has had 1 injection.  He denies chest pain shortness of breath or any neurological symptoms.  The patient denies daily depression.  He denies anxiety problems.  His irritability is much less and he has mild occasional emotional explosions.  They are not clinically significant.  He is sleeping and eating well.  He enjoys life.  He has a Clinical research associate.  He exercises regularly.  He has 5 dogs that he enjoys.  Watches TV regularly.  Financially he is stable.  His wife's health is good.  The patient denies any psychotic symptoms.  He denies the use of alcohol or drugs.  He takes his medicines just as prescribed.  He rarely has fatigue.  Is positive and optimistic.  Past Medical History:  Past Medical History:  Diagnosis Date  . ANXIETY   . Arthritis    low back  . Bladder stone   . BPH (benign prostatic hypertrophy)   . Carotid artery occlusion   . 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 CVD (cerebrovascular disease)   . Eczema   . GERD (gastroesophageal reflux disease)   . History of carotid artery stenosis    S/P BILATERAL CEA  . 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 Hyperlipidemia   . Hypertension   . Nocturia   . S/P carotid endarterectomy    BILATERAL ICA--  PATENT PER DUPLEX  05-19-2012  . Squamous cell carcinoma    right calf  . Stroke [facility]) 1996   pt states no weaknes but has severe anxiety  . Urinary frequency   . Vitamin D deficiency     Past Surgical History:  Procedure Laterality Date  . APPENDECTOMY  AS CHILD  . CARDIOVASCULAR STRESS TEST  03-27-2012  William [name]   LOW RISK LEXISCAN  STUDY-- PROBABLE NORMAL PERFUSION AND SOFT TISSUE ATTENUATION/  NO ISCHEMIA/ EF 51%  . CAROTID ENDARTERECTOMY Bilateral LEFT  11-12-2008  William William [name]   RIGHT ICA  2006  ([facility])  . CYSTOSCOPY WITH LITHOLAPAXY N/A 02/26/2013   Procedure: CYSTOSCOPY WITH LITHOLAPAXY;  Surgeon: William Gallo, MD;  [facility];  Service: Urology;  Laterality: N/A;  . EYE SURGERY  Jan. 2016   cataract surgery both eyes  . INGUINAL HERNIA REPAIR Right 11-08-2006  . MASS EXCISION N/A 03/03/2016   Procedure: EXCISION OF BACK  MASS;  Surgeon: William Klein, MD;  Location: [place];  Service: General;  Laterality: N/A;  . MOHS SURGERY Left 1/ 2016   William William [name]-- Basal cell  . PROSTATE SURGERY    . TRANSURETHRAL RESECTION OF PROSTATE N/A 02/26/2013   Procedure: TRANSURETHRAL RESECTION OF THE PROSTATE WITH GYRUS INSTRUMENTS;  Surgeon: William Gallo, MD;  Location: [facility];  Service: Urology;  Laterality: N/A;   Family History:  Family History  Problem Relation Age of Onset  . Heart disease Mother        CHF  . Bipolar disorder Mother   . Heart disease Father        CHF   Family Psychiatric  History:  Social History:  Social History   Substance and Sexual Activity  Alcohol Use Yes  . Alcohol/week: 0.0 standard drinks   Comment: Occasional     Social History   Substance and Sexual Activity  Drug Use No    Social History   Socioeconomic History  . Marital status: Married    Spouse name: Not on file  . Number of children: 2  . Years of education: Not on file  . Highest education level: Not on file  Occupational History    Employer: Retired  Scientific laboratory technician  . Financial resource strain: Not on file  . Food insecurity:    Worry: Not on file    Inability: Not on file  . Transportation needs:    Medical: Not on file    Non-medical: Not on file  Tobacco Use  . Smoking status: Former Smoker    Packs/day: 2.00    Years: 40.00    Pack  years: 80.00    Types: Cigarettes    Last attempt to quit: 02/15/2005    Years since quitting: 13.4  . Smokeless tobacco: Never Used  Substance and Sexual Activity  . Alcohol use: Yes    Alcohol/week: 0.0 standard drinks    Comment: Occasional  . Drug use: No  . Sexual activity: Yes    Partners: Female  Lifestyle  . Physical activity:    Days per week: Not on file    Minutes per session: Not on file  . Stress: Not on file  Relationships  . Social connections:    Talks on phone: Not on file    Gets together: Not on file    Attends religious service: Not on file    Active member of club or organization: Not on file    Attends meetings of clubs or organizations: Not on file    Relationship status: Not on file  Other Topics Concern  . Not on file  Social History Narrative   Exercise--  Engineer, manufacturing everyday   Additional Social History:                         Sleep: Good  Appetite:  Good  Current Medications: Current Outpatient Medications  Medication Sig Dispense Refill  . amLODipine (NORVASC) 5 MG tablet Take 1 tablet (5 mg total) by mouth daily. 90 tablet 1  . aspirin 325 MG EC tablet Take 325 mg by mouth daily.    . Calcium Carb-Cholecalciferol (CALCIUM 1000 + D PO) Take by mouth daily.      . Eszopiclone 3 MG TABS TAKE 1 TABLET BY MOUTH AT BEDTIME AS NEEDED TAKE IMMEDIATELY BEFORE BEDTIME 90 tablet 2  . ezetimibe (ZETIA) 10 MG tablet TAKE 1 TABLET (10 MG TOTAL) BY MOUTH DAILY. 90 tablet 3  . fenofibrate 160 MG tablet TAKE 1 TABLET BY MOUTH DAILY. REPEAT LABS ARE DUE NOW 90 tablet 1  . Flaxseed, Linseed, (FLAX SEED OIL PO) Take by mouth daily.      . fluticasone (CUTIVATE) 0.05 % cream   3  . LORazepam (ATIVAN) 0.5 MG tablet 2  Bid  1  qday 150 tablet 5  . Multiple Vitamin (MULTIVITAMIN) tablet Take 1 tablet by mouth daily.    [name] NEXIUM 40 MG capsule Take 1 capsule (40 mg total) by mouth daily as needed. 90 capsule 3  . NONFORMULARY OR COMPOUNDED ITEM [facility]:  Onychomycosis Nail lacquer - Fluconazole 2%, Terbinafine 1%, DMSO, apply  to affected area daily. 120 each 2  . tamsulosin (FLOMAX) 0.4 MG CAPS capsule 1 po qd 30 capsule   . traMADol (ULTRAM) 50 MG tablet Take 1 tablet (50 mg total) by mouth every 8 (eight) hours as needed. 30 tablet 0  . carbamazepine (TEGRETOL-XR) 100 MG 12 hr tablet 1 qam  1  qhs 180 tablet 2  . Cholecalciferol (VITAMIN D) 2000 UNITS CAPS Take by mouth.     No current facility-administered medications for this visit.     Lab Results: No results found for this or any previous visit (from the past 48 hour(s)).  Physical Findings: AIMS:  , ,  ,  ,    CIWA:    COWS:     Musculoskeletal: Strength & Muscle Tone: within normal limits Gait & Station: normal Patient leans: Right  Psychiatric Specialty Exam: ROS  Blood pressure 123/80, height 5' 10.5" (1.791 m), weight 230 lb 3.2 oz (104.4 kg).Body mass index is 32.56 kg/m.  General Appearance: Casual  Eye Contact::  Good  Speech:  Clear and Coherent  Volume:  Normal  Mood:  Dysphoric and Euthymic  Affect:  Appropriate  Thought Process:  Coherent  Orientation:  Full (Time, Place, and Person)  Thought Content:  WDL  Suicidal Thoughts:  No  Homicidal Thoughts:  No  Memory:  NA  Judgement:  Good  Insight:  Fair  Psychomotor Activity:  Normal  Concentration:  Good  Recall:  Good  Fund of Knowledge:Good  Language: Good  Akathisia:  No  Handed:  Right  AIMS (if indicated):     Assets:  Desire for Improvement  ADL's:  Intact  Cognition: WNL  Sleep:      Treatment Plan Summary: 08/10/2018, 1:41 PM  This patient has 3 problems.  His first problem is that of mood instability condition probably related to his stroke.  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  Is been taking Tegretol for years and is done extremely well.  He has no side effects from it.  He takes twice a day as ordered does very well.  His second problem is  that of insomnia.  For insomnia he takes [place] and gets a good night of sleep.  His third problem is adjustment disorder with an anxious mood state.  For this he takes a moderate dose of Ativan.  He is not oversedated.  He is not lethargic.  He is not had any falls.  His anxiety seems to be relatively well controlled.  The patient is certainly not suicidal.  He is not agitated.  He functions extremely well with his wife.  He will return to see me in 5 months.

## 2018-08-22 MED FILL — carBAMazepine ER 100 MG TB1: 100 | 90 days supply | Qty: 180 | Fill #1

## 2018-08-28 MED FILL — LORazepam 0.5 MG TABS: 0.5 | 30 days supply | Qty: 150 | Fill #3

## 2018-08-28 MED FILL — NexIUM 40 MG CPDR: 40 | 90 days supply | Qty: 90 | Fill #2

## 2018-09-16 MED FILL — ESZOPICLONE 3 MG TABS: 3 | 90 days supply | Qty: 90 | Fill #0

## 2018-09-18 MED FILL — FENOFIBRATE 160 MG TABLET: 160 | 90 days supply | Qty: 90 | Fill #1

## 2018-10-05 ENCOUNTER — Encounter: Payer: Self-pay | Admitting: Family Medicine

## 2018-10-06 ENCOUNTER — Encounter: Payer: Self-pay | Admitting: Family Medicine

## 2018-10-09 ENCOUNTER — Telehealth: Payer: Self-pay | Admitting: *Deleted

## 2018-10-09 MED ORDER — NONFORMULARY OR COMPOUNDED ITEM: 2 refills | Status: DC

## 2018-10-09 MED FILL — LORazepam 0.5 MG TABS: 0.5 | 30 days supply | Qty: 150 | Fill #0

## 2018-10-09 MED FILL — EZETIMIBE 10 MG TABS: 10 | 90 days supply | Qty: 90 | Fill #2

## 2018-10-09 NOTE — Telephone Encounter (Signed)
Received faxed request for refill of the [place] Antifungal Nail Lacquer. Our office is not longer using [facility] and Dr. Jacqualyn Posey ordered refill +2 additional and pt will need an appt in June or July time frame, due to the pandemic.

## 2018-10-09 NOTE — Telephone Encounter (Signed)
I informed pt's wife, [name] of Dr. Leigh Aurora change of therapy and gave [facility] [phone], they would contact pt with the coverage and delivery information and that this compound contain more ingredients to increase absorption of the antifungal medications.

## 2018-10-10 ENCOUNTER — Ambulatory Visit: Payer: PPO | Admitting: Podiatry

## 2018-10-15 MED FILL — AMLODIPINE BESYLATE 5 MG TA: 5 | 90 days supply | Qty: 90 | Fill #1

## 2018-10-19 DIAGNOSIS — N183 Chronic kidney disease, stage 3 (moderate): Secondary | ICD-10-CM | POA: Diagnosis not present

## 2018-10-19 DIAGNOSIS — I129 Hypertensive chronic kidney disease with stage 1 through stage 4 chronic kidney disease, or unspecified chronic kidney disease: Secondary | ICD-10-CM | POA: Diagnosis not present

## 2018-10-19 DIAGNOSIS — K219 Gastro-esophageal reflux disease without esophagitis: Secondary | ICD-10-CM | POA: Diagnosis not present

## 2018-10-23 ENCOUNTER — Other Ambulatory Visit: Payer: Self-pay | Admitting: Nephrology

## 2018-10-23 DIAGNOSIS — I129 Hypertensive chronic kidney disease with stage 1 through stage 4 chronic kidney disease, or unspecified chronic kidney disease: Secondary | ICD-10-CM

## 2018-10-23 DIAGNOSIS — N183 Chronic kidney disease, stage 3 unspecified: Secondary | ICD-10-CM

## 2018-10-25 DIAGNOSIS — N183 Chronic kidney disease, stage 3 (moderate): Secondary | ICD-10-CM | POA: Diagnosis not present

## 2018-10-27 ENCOUNTER — Ambulatory Visit
Admission: RE | Admit: 2018-10-27 | Discharge: 2018-10-27 | Disposition: A | Discharge status: Home / Self Care | Payer: PPO | Source: Ambulatory Visit | Attending: Nephrology | Admitting: Nephrology

## 2018-10-27 DIAGNOSIS — N183 Chronic kidney disease, stage 3 unspecified: Secondary | ICD-10-CM

## 2018-10-27 DIAGNOSIS — I129 Hypertensive chronic kidney disease with stage 1 through stage 4 chronic kidney disease, or unspecified chronic kidney disease: Secondary | ICD-10-CM

## 2018-10-27 MED FILL — LISINOPRIL-HCTZ 10-12.5 MG: 10-12.5 | 30 days supply | Qty: 30 | Fill #0

## 2018-11-02 ENCOUNTER — Encounter: Payer: Self-pay | Admitting: Podiatry

## 2018-11-02 ENCOUNTER — Other Ambulatory Visit: Payer: Self-pay

## 2018-11-02 ENCOUNTER — Ambulatory Visit: Payer: PPO | Admitting: Podiatry

## 2018-11-02 VITALS — Temp 97.9°F

## 2018-11-02 DIAGNOSIS — M79676 Pain in unspecified toe(s): Secondary | ICD-10-CM | POA: Diagnosis not present

## 2018-11-02 DIAGNOSIS — B351 Tinea unguium: Secondary | ICD-10-CM | POA: Diagnosis not present

## 2018-11-03 DIAGNOSIS — N3941 Urge incontinence: Secondary | ICD-10-CM | POA: Diagnosis not present

## 2018-11-03 DIAGNOSIS — N5201 Erectile dysfunction due to arterial insufficiency: Secondary | ICD-10-CM | POA: Diagnosis not present

## 2018-11-03 DIAGNOSIS — N401 Enlarged prostate with lower urinary tract symptoms: Secondary | ICD-10-CM | POA: Diagnosis not present

## 2018-11-10 NOTE — Progress Notes (Signed)
Subjective: 78 y.o. returns the office today for painful, elongated, thickened toenails which he cannot trim himself. Denies any redness or drainage around the nails.  He is continue with a compound cream for the nail fungus and this has been helpful.  Denies any acute changes since last appointment and no new complaints today. Denies any systemic complaints such as fevers, chills, nausea, vomiting.   PCP: Carollee Herter, Alferd Apa, DO  Objective: AAO 3, NAD, presents with his wife DP/PT pulses palpable, CRT less than 3 seconds  Nails hypertrophic, dystrophic, elongated, brittle, discolored x 10. There is tenderness overlying the nails 1-5 bilaterally. There is no surrounding erythema or drainage along the nail sites. No open lesions or pre-ulcerative lesions are identified. No pain with calf compression, swelling, warmth, erythema.  Assessment: Patient presents with symptomatic onychomycosis  Plan: -Treatment options including alternatives, risks, complications were discussed -Nails sharply debrided 10 without complication/bleeding.  There is some improvement in color to the toenails and starting the topical ointment.  Would continue with this. -Discussed daily foot inspection. If there are any changes, to call the office immediately.  -Follow-up in 3 months or sooner if any problems are to arise. In the meantime, encouraged to call the office with any questions, concerns, changes symptoms.  Celesta Gentile, DPM

## 2018-11-13 MED FILL — LORazepam 0.5 MG TABS: 0.5 | 30 days supply | Qty: 150 | Fill #1

## 2018-11-20 MED FILL — LISINOPRIL-HCTZ 10-12.5 MG: 10-12.5 | 30 days supply | Qty: 30 | Fill #1

## 2018-11-20 MED FILL — carBAMazepine ER 100 MG TB1: 100 | 90 days supply | Qty: 180 | Fill #0

## 2018-11-21 ENCOUNTER — Encounter: Payer: Self-pay | Admitting: Family Medicine

## 2018-11-24 ENCOUNTER — Ambulatory Visit (INDEPENDENT_AMBULATORY_CARE_PROVIDER_SITE_OTHER): Payer: PPO | Admitting: Family Medicine

## 2018-11-24 ENCOUNTER — Encounter: Payer: Self-pay | Admitting: Family Medicine

## 2018-11-24 ENCOUNTER — Other Ambulatory Visit: Payer: Self-pay

## 2018-11-24 VITALS — BP 116/63 | HR 61 | Temp 98.1°F | Resp 12 | Ht 71.0 in | Wt 222.6 lb

## 2018-11-24 DIAGNOSIS — M79604 Pain in right leg: Secondary | ICD-10-CM | POA: Diagnosis not present

## 2018-11-24 DIAGNOSIS — M79605 Pain in left leg: Secondary | ICD-10-CM | POA: Diagnosis not present

## 2018-11-24 MED ORDER — NONFORMULARY OR COMPOUNDED ITEM: 0 refills | Status: DC

## 2018-11-24 NOTE — Progress Notes (Signed)
Patient ID: William [name], male    DOB: [birth year]  Age: 78 y.o. MRN: [id number]    Subjective:  Subjective  HPI  William [name] presents for legs aching esp with walking   Review of Systems  Constitutional: Negative for appetite change, diaphoresis, fatigue and unexpected weight change.  Eyes: Negative for pain, redness and visual disturbance.  Respiratory: Negative for cough, chest tightness, shortness of breath and wheezing.   Cardiovascular: Negative for chest pain, palpitations and leg swelling.  Endocrine: Negative for cold intolerance, heat intolerance, polydipsia, polyphagia and polyuria.  Genitourinary: Negative for difficulty urinating, dysuria and frequency.  Musculoskeletal:       Achy legs   Neurological: Negative for dizziness, light-headedness, numbness and headaches.    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   CVD (cerebrovascular disease)    Eczema    GERD (gastroesophageal reflux disease)    History of carotid artery stenosis    S/P BILATERAL CEA   History of CVA (cerebrovascular accident) without residual deficits    2006   Hyperlipidemia    Hypertension    Nocturia    S/P carotid endarterectomy    BILATERAL ICA--  PATENT PER DUPLEX  05-19-2012   Squamous cell carcinoma    right calf   Stroke ([place]) 1996   pt states no weaknes but has severe anxiety   Urinary frequency    Vitamin D deficiency     He has a past surgical history that includes Carotid endarterectomy (Bilateral, LEFT  11-12-2008  DR GREG HAYES); Inguinal hernia repair (Right, 11-08-2006); Cardiovascular stress test (03-27-2012  DR CRENSHAW); Appendectomy (AS CHILD); Cystoscopy with litholapaxy (N/A, 02/26/2013); Transurethral resection of prostate (N/A, 02/26/2013); Prostate surgery; Mohs surgery (Left,  1/ 2016); Eye surgery (Jan. 2016); and Mass excision (N/A, 03/03/2016).   His family history includes Bipolar disorder in his mother; Heart disease in his father and mother.He reports that he quit smoking about 13 years ago. His smoking use included cigarettes. He has a 80.00 pack-year smoking history. He has never used smokeless tobacco. He reports current alcohol use. He reports that he does not use drugs.  Current Outpatient Medications on File Prior to Visit  Medication Sig Dispense Refill   amLODipine (NORVASC) 5 MG tablet Take 1 tablet (5 mg total) by mouth daily. 90 tablet 1   aspirin 325 MG EC tablet Take 325 mg by mouth daily.     Calcium Carb-Cholecalciferol (CALCIUM 1000 + D PO) Take by mouth daily.       carbamazepine (TEGRETOL-XR) 100 MG 12 hr tablet 1 qam  1  qhs 180 tablet 2   Cholecalciferol (VITAMIN D) 2000 UNITS CAPS Take by mouth.     Eszopiclone 3 MG TABS TAKE 1 TABLET BY MOUTH AT BEDTIME AS NEEDED TAKE IMMEDIATELY BEFORE BEDTIME 90 tablet 2   ezetimibe (ZETIA) 10 MG tablet TAKE 1 TABLET (10 MG TOTAL) BY MOUTH DAILY. 90 tablet 3   famotidine (PEPCID) 20 MG tablet Take 20 mg by mouth 2 (two) times daily.     fenofibrate 160 MG tablet TAKE 1 TABLET BY MOUTH DAILY. REPEAT LABS ARE DUE NOW 90 tablet 1   Flaxseed, Linseed, (FLAX SEED OIL PO) Take by mouth daily.       fluticasone (  CUTIVATE) 0.05 % cream   3   lisinopril-hydrochlorothiazide (ZESTORETIC) 10-12.5 MG tablet Take 1 tablet by mouth daily.     LORazepam (ATIVAN) 0.5 MG tablet 2  Bid  1 as needed 150 tablet 5   Multiple Vitamin (MULTIVITAMIN) tablet Take 1 tablet by mouth daily.     NONFORMULARY OR COMPOUNDED ITEM [facility]:  Onychomycosis Nail lacquer - Fluconazole 2%, Terbinafine 1%, DMSO, apply to affected area daily. 120 each 2   NONFORMULARY OR COMPOUNDED ITEM [facility]:  Antifungal topical - Terbinafine 3%, Fluconazole 2%, Tea Tree Oil 5%, Urea 10/%,, Ibuprofen 2% in DMSO Suspension  #28ml. Apply to the affected nail(s) once (at bedtime) or twice daily. 100 each 2   traMADol (ULTRAM) 50 MG tablet Take 1 tablet (50 mg total) by mouth every 8 (eight) hours as needed. 30 tablet 0   No current facility-administered medications on file prior to visit.      Objective:  Objective  Physical Exam Vitals signs and nursing note reviewed.  Constitutional:      General: He is sleeping.     Appearance: He is well-developed.  HENT:     Head: Normocephalic and atraumatic.  Eyes:     Pupils: Pupils are equal, round, and reactive to light.  Neck:     Musculoskeletal: Normal range of motion and neck supple.     Thyroid: No thyromegaly.  Cardiovascular:     Rate and Rhythm: Normal rate and regular rhythm.     Heart sounds: No murmur.  Pulmonary:     Effort: Pulmonary effort is normal. No respiratory distress.     Breath sounds: Normal breath sounds. No wheezing or rales.  Chest:     Chest wall: No tenderness.  Musculoskeletal:        General: Swelling and tenderness present.     Left lower leg: He exhibits no tenderness.       Legs:  Skin:    General: Skin is warm and dry.  Neurological:     Mental Status: He is oriented to person, place, and time.  Psychiatric:        Behavior: Behavior normal.        Thought Content: Thought content normal.        Judgment: Judgment normal.    BP 116/63 (BP Location: Right Arm, Cuff Size: Normal)    Pulse 61    Temp 98.1 F (36.7 C) (Oral)    Resp 12    Ht 5\' 11"  (1.803 m)    Wt 222 lb 9.6 oz (101 kg)    SpO2 98%    BMI 31.05 kg/m  Wt Readings from Last 3 Encounters:  11/24/18 222 lb 9.6 oz (101 kg)  06/26/18 229 lb 3.2 oz (104 kg)  04/27/18 231 lb 3.2 oz (104.9 kg)     Lab Results  Component Value Date   WBC 6.4 09/28/2017   HGB 13.2 09/28/2017   HCT 38.9 (L) 09/28/2017   PLT 241.0 09/28/2017   GLUCOSE 98 07/18/2018   CHOL 176 06/16/2018   TRIG 75.0 06/16/2018   HDL 56.00 06/16/2018   LDLCALC 105 (H) 06/16/2018    ALT 19 06/16/2018   AST 18 06/16/2018   NA 139 07/18/2018   K 4.1 07/18/2018   CL 103 07/18/2018   CREATININE 1.66 (H) 07/18/2018   BUN 29 (H) 07/18/2018   CO2 25 07/18/2018   TSH 1.668 10/16/2013   PSA 2.10 12/24/2016   INR 1.0 11/08/2008   HGBA1C  5.8 03/27/2018    US Renal Artery Duplex Complete  Result Date: 10/27/2018 CLINICAL DATA:  78 year old with chronic kidney disease, stage III. Hypertension. EXAM: RENAL/URINARY TRACT ULTRASOUND RENAL DUPLEX DOPPLER ULTRASOUND COMPARISON:  Ultrasound 08/08/2013.  CT urogram 03/15/2014 FINDINGS: Right Kidney: Size: 12.5 x 7.0 x 6.8 cm, calculated volume is 312 mL. Echogenicity is within normal limits. Again noted is a large cyst in the upper pole measuring up to 4.8 cm and measured 3.5 cm in 2015. Additional cysts scattered throughout the right kidney. Negative for hydronephrosis. Left Kidney: Size: 11.0 x 5.7 x 5.2 cm, calculated volume is 175 mL. Normal echogenicity without hydronephrosis. Again noted is a cyst in the lower pole measuring up to 1.7 cm and previously measured 1.6 cm. 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 Bladder: Small amount of fluid in the bladder. Prostate is prominent measuring 5.9 x 4.5 x 4.2 cm RENAL DUPLEX ULTRASOUND Right Renal Artery Velocities: Origin:  127 cm/sec Mid: Not visualized [place]:  72 cm/sec Interlobar:  48 cm/sec Arcuate:  34 cm/sec Left Renal Artery Velocities: Origin:  142 cm/sec Mid:  101 cm/sec Hilum:  92 cm/sec Interlobar:  47 cm/sec Arcuate:  28 cm/sec Aortic Velocity:  74 cm/sec Right Renal-Aortic Ratios: Origin: 1.7 Mid:  Could not be calculated [place]: 1.0 Interlobar: 0.7 Arcuate: 0.5 Left Renal-Aortic Ratios: Origin: 1.9 Mid: 1.4 Hilum: 1.2 Interlobar: 0.6 Arcuate: 0.4 Atherosclerotic disease in the abdominal aorta without aneurysm. Proximal abdominal aorta is obscured by bowel gas. Renal veins appear to be patent but poorly characterized on this examination. IMPRESSION: 1. Bilateral renal cysts  without hydronephrosis. Renal echogenicity is within normal limits. 2. No evidence for renal artery stenosis. 3. Prostate enlargement. Electronically Signed   By: Markus Daft M.D.   On: 10/27/2018 11:29     Assessment & Plan:  Plan  I have discontinued William [name]. William [name] and NexIUM. I am also having him start on NONFORMULARY OR COMPOUNDED ITEM. Additionally, I am having him maintain his (Flaxseed, Linseed, (FLAX SEED OIL PO)), Calcium Carb-Cholecalciferol (CALCIUM 1000 + D PO), aspirin, multivitamin, Vitamin D, fluticasone, NONFORMULARY OR COMPOUNDED ITEM, ezetimibe, fenofibrate, traMADol, amLODipine, carbamazepine, Eszopiclone, LORazepam, NONFORMULARY OR COMPOUNDED ITEM, famotidine, and lisinopril-hydrochlorothiazide.  Meds ordered this encounter  Medications   NONFORMULARY OR COMPOUNDED ITEM    Sig: Compression socks  #1  Dx low ext edema and ache    Dispense:  1 each    Refill:  0    Problem List Items Addressed This Visit    None    Visit Diagnoses    Leg pain, bilateral    -  Primary   Relevant Medications   NONFORMULARY OR COMPOUNDED ITEM   Other Relevant Orders   VAS [place] ABI WITH/WO TBI    compression socks ABI Consider vascular   Follow-up: Return if symptoms worsen or fail to improve.  Ann Held, DO

## 2018-11-25 ENCOUNTER — Encounter: Payer: Self-pay | Admitting: Family Medicine

## 2018-11-26 ENCOUNTER — Encounter: Payer: Self-pay | Admitting: Family Medicine

## 2018-11-27 ENCOUNTER — Other Ambulatory Visit: Payer: Self-pay | Admitting: Family Medicine

## 2018-11-27 DIAGNOSIS — R29898 Other symptoms and signs involving the musculoskeletal system: Secondary | ICD-10-CM

## 2018-11-27 NOTE — Telephone Encounter (Signed)
I will refer him to neuro to make sure we are not missing anything I still want to check the [place]

## 2018-12-08 ENCOUNTER — Ambulatory Visit (HOSPITAL_BASED_OUTPATIENT_CLINIC_OR_DEPARTMENT_OTHER)
Admission: RE | Admit: 2018-12-08 | Discharge: 2018-12-08 | Disposition: A | Discharge status: Home / Self Care | Payer: PPO | Source: Ambulatory Visit | Attending: Family Medicine | Admitting: Family Medicine

## 2018-12-08 ENCOUNTER — Other Ambulatory Visit: Payer: Self-pay

## 2018-12-08 DIAGNOSIS — M79605 Pain in left leg: Secondary | ICD-10-CM | POA: Insufficient documentation

## 2018-12-08 DIAGNOSIS — M79604 Pain in right leg: Secondary | ICD-10-CM | POA: Diagnosis not present

## 2018-12-08 NOTE — Progress Notes (Signed)
ABI lower extremity doppler performed    12/08/18 Cardell Peach RDCS, RVT

## 2018-12-11 ENCOUNTER — Other Ambulatory Visit: Payer: Self-pay | Admitting: Family Medicine

## 2018-12-11 MED FILL — ESZOPICLONE 3 MG TABS: 3 | 90 days supply | Qty: 90 | Fill #1

## 2018-12-12 ENCOUNTER — Encounter: Payer: Self-pay | Admitting: Neurology

## 2018-12-12 MED FILL — FENOFIBRATE 160 MG TABLET: 160 | 90 days supply | Qty: 90 | Fill #0

## 2018-12-18 ENCOUNTER — Telehealth: Payer: Self-pay | Admitting: Family Medicine

## 2018-12-18 MED FILL — LORazepam 0.5 MG TABS: 0.5 | 30 days supply | Qty: 150 | Fill #2

## 2018-12-18 MED FILL — LISINOPRIL-HCTZ 10-12.5 MG: 10-12.5 | 30 days supply | Qty: 30 | Fill #2

## 2018-12-18 NOTE — Telephone Encounter (Signed)
Spoke to [name], wife, about RS appt for CPE (Dr. Carollee Herter out of office 7/9). [name] said pt cannot wait until September. Please advise.

## 2018-12-21 ENCOUNTER — Encounter: Payer: Self-pay | Admitting: Family Medicine

## 2018-12-26 ENCOUNTER — Other Ambulatory Visit: Payer: Self-pay | Admitting: Family Medicine

## 2018-12-26 DIAGNOSIS — Z23 Encounter for immunization: Secondary | ICD-10-CM

## 2018-12-26 MED ORDER — SHINGRIX 50 MCG/0.5ML IM SUSR: INTRAMUSCULAR | 1 refills | Status: DC

## 2018-12-26 MED FILL — SHINGRIX 50 MCG SUS: 50 | 1 days supply | Qty: 1 | Fill #0

## 2018-12-27 ENCOUNTER — Encounter: Payer: Self-pay | Admitting: Family Medicine

## 2019-01-08 MED FILL — EZETIMIBE 10 MG TABS: 10 | 90 days supply | Qty: 90 | Fill #3

## 2019-01-08 NOTE — Progress Notes (Signed)
NEUROLOGY CONSULTATION NOTE  William [name] MRN: [id number] DOB: 1940/09/15  Referring provider: Roma Schanz, DO Primary care provider: Roma Schanz, DO  Reason for consult:  Bilateral leg weakness  HISTORY OF PRESENT ILLNESS: William [name] is a 78 year old man with dementia, CKD, HTN, CAD, HLD and history of CVA who presents for bilateral leg pain and weakness.  He is accompanied by his wife who supplements history.  History supplemented by referring provider note.  Prior to [place], he was going to the gym and seeing a personal trainer but it stopped once the pandemic hit.  Since the pandemic, he has been sedentary.  He walks the dog once or twice a week.  He started having bilateral leg pain about 3 months ago.  No associated back pain.  No shooting pain.  It is described as an aching in his calves.  No associated numbness.  He also reports weakness.  When he walks, he has trouble picking up his legs.  He seems to shuffle his feet.  When he gets up to stand, he feels like he can fall forward.  He had one fall while standing at the toilet and he bent forward to spit in toilet.  No change in bowel habit.  He was having urinary issues so he was taken off of Flomax.    He had been started on new medications a few months ago.  He was switched from lisinopril to Norvasc and then lisinopril was added again with HCTZ in order to help with the fluid in the legs.    Vascular US ABI from 12/08/18 showed abnormal toe-brachial index in both lower extremities but otherwise unremarkable.  He does have history of low back pain.  Last MRI of lumbar spine from 08/06/10 was personally reviewed and demonstrated mild degenerative changes without significant spinal stenosis or nerve root compression.  He had an epidural injection in the lumbar region several months ago for low back pain which was effective.    He previously had leg pain on a statin so he was switched to Zetia.  It helped for awhile.     Hgb A1c from 03/27/18 was 5.8.    PAST MEDICAL HISTORY: Past Medical History:  Diagnosis Date  . ANXIETY   . Arthritis    low back  . Bladder stone   . BPH (benign prostatic hypertrophy)   . Carotid artery occlusion   . 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 CVD (cerebrovascular disease)   . Eczema   . GERD (gastroesophageal reflux disease)   . History of carotid artery stenosis    S/P BILATERAL CEA  . 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 Hyperlipidemia   . Hypertension   . Nocturia   . S/P carotid endarterectomy    BILATERAL ICA--  PATENT PER DUPLEX  05-19-2012  . Squamous cell carcinoma    right calf  . Stroke [facility]) 1996   pt states no weaknes but has severe anxiety  . Urinary frequency   . Vitamin D deficiency     PAST SURGICAL HISTORY: Past Surgical History:  Procedure Laterality Date  . APPENDECTOMY  AS CHILD  . CARDIOVASCULAR STRESS TEST  03-27-2012  DR CRENSHAW   LOW RISK LEXISCAN STUDY-- PROBABLE NORMAL PERFUSION AND SOFT TISSUE ATTENUATION/  NO ISCHEMIA/ EF 51%  . CAROTID ENDARTERECTOMY Bilateral LEFT  11-12-2008  DR GREG HAYES   RIGHT ICA  2006  (  [facility])  . CYSTOSCOPY WITH LITHOLAPAXY N/A 02/26/2013   Procedure: CYSTOSCOPY WITH LITHOLAPAXY;  Surgeon: Franchot Gallo, MD;  Location: [facility];  Service: Urology;  Laterality: N/A;  . EYE SURGERY  Jan. 2016   cataract surgery both eyes  . INGUINAL HERNIA REPAIR Right 11-08-2006  . MASS EXCISION N/A 03/03/2016   Procedure: EXCISION OF BACK  MASS;  Surgeon: Stark Klein, MD;  Location: [place];  Service: General;  Laterality: N/A;  . 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 PROSTATE SURGERY    . TRANSURETHRAL RESECTION OF PROSTATE N/A 02/26/2013   Procedure: TRANSURETHRAL RESECTION OF THE PROSTATE WITH GYRUS INSTRUMENTS;  Surgeon: Franchot Gallo, MD;  Location[facility];  Service: Urology;   Laterality: N/A;    MEDICATIONS: Current Outpatient Medications on File Prior to Visit  Medication Sig Dispense Refill  . amLODipine (NORVASC) 5 MG tablet Take 1 tablet (5 mg total) by mouth daily. 90 tablet 1  . aspirin 325 MG EC tablet Take 325 mg by mouth daily.    . Calcium Carb-Cholecalciferol (CALCIUM 1000 + D PO) Take by mouth daily.      . carbamazepine (TEGRETOL-XR) 100 MG 12 hr tablet 1 qam  1  qhs 180 tablet 2  . Cholecalciferol (VITAMIN D) 2000 UNITS CAPS Take by mouth.    . Eszopiclone 3 MG TABS TAKE 1 TABLET BY MOUTH AT BEDTIME AS NEEDED TAKE IMMEDIATELY BEFORE BEDTIME 90 tablet 2  . ezetimibe (ZETIA) 10 MG tablet TAKE 1 TABLET (10 MG TOTAL) BY MOUTH DAILY. 90 tablet 3  . famotidine (PEPCID) 20 MG tablet Take 20 mg by mouth 2 (two) times daily.    . fenofibrate 160 MG tablet TAKE 1 TABLET BY MOUTH DAILY. REPEAT LABS ARE DUE NOW 90 tablet 1  . Flaxseed, Linseed, (FLAX SEED OIL PO) Take by mouth daily.      . fluticasone (CUTIVATE) 0.05 % cream   3  . lisinopril-hydrochlorothiazide (ZESTORETIC) 10-12.5 MG tablet Take 1 tablet by mouth daily.    [name] LORazepam (ATIVAN) 0.5 MG tablet 2  Bid  1 as needed 150 tablet 5  . Multiple Vitamin (MULTIVITAMIN) tablet Take 1 tablet by mouth daily.    . NONFORMULARY OR COMPOUNDED ITEM [facility]:  Onychomycosis Nail lacquer - Fluconazole 2%, Terbinafine 1%, DMSO, apply to affected area daily. 120 each 2  . NONFORMULARY OR COMPOUNDED ITEM [facility]:  Antifungal topical - Terbinafine 3%, Fluconazole 2%, Tea Tree Oil 5%, Urea 10/%,, Ibuprofen 2% in DMSO Suspension #59ml. Apply to the affected nail(s) once (at bedtime) or twice daily. 100 each 2  . NONFORMULARY OR COMPOUNDED ITEM Compression socks  #1  Dx low ext edema and ache 1 each 0  . traMADol (ULTRAM) 50 MG tablet Take 1 tablet (50 mg total) by mouth every 8 (eight) hours as needed. 30 tablet 0  . Zoster Vaccine Adjuvanted [facility]) injection 0.5 ml IM x1 repeat in 2 months 0.5  mL 1   No current facility-administered medications on file prior to visit.     ALLERGIES: Allergies  Allergen Reactions  . Other Anaphylaxis  . Strawberry Extract Hives  . Adhesive [Tape] Other (See Comments)    BLISTER  . Latex     Reaction to Plastic Tape  . Statins Other (See Comments)    myalgias  . Strawberry (Diagnostic) Hives    FAMILY HISTORY: Family History  Problem Relation Age of Onset  .  Heart disease Mother        CHF  . Bipolar disorder Mother   . Heart disease Father        CHF    SOCIAL HISTORY: Social History   Socioeconomic History  . Marital status: Married    Spouse name: Not on file  . Number of children: 2  . Years of education: Not on file  . Highest education level: Not on file  Occupational History    Employer: Retired  Scientific laboratory technician  . Financial resource strain: Not on file  . Food insecurity    Worry: Not on file    Inability: Not on file  . Transportation needs    Medical: Not on file    Non-medical: Not on file  Tobacco Use  . Smoking status: Former Smoker    Packs/day: 2.00    Years: 40.00    Pack years: 80.00    Types: Cigarettes    Quit date: 02/15/2005    Years since quitting: 13.9  . Smokeless tobacco: Never Used  Substance and Sexual Activity  . Alcohol use: Yes    Alcohol/week: 0.0 standard drinks    Comment: Occasional  . Drug use: No  . Sexual activity: Yes    Partners: Female  Lifestyle  . Physical activity    Days per week: Not on file    Minutes per session: Not on file  . Stress: Not on file  Relationships  . Social Herbalist on phone: Not on file    Gets together: Not on file    Attends religious service: Not on file    Active member of club or organization: Not on file    Attends meetings of clubs or organizations: Not on file    Relationship status: Not on file  . Intimate partner violence    Fear of current or ex partner: Not on file    Emotionally abused: Not on file    Physically  abused: Not on file    Forced sexual activity: Not on file  Other Topics Concern  . Not on file  Social History Narrative   Exercise--  Walks dogs everyday    REVIEW OF SYSTEMS: Constitutional: No fevers, chills, or sweats, no generalized fatigue, change in appetite Eyes: No visual changes, double vision, eye pain Ear, nose and throat: No hearing loss, ear pain, nasal congestion, sore throat Cardiovascular: No chest pain, palpitations Respiratory:  No shortness of breath at rest or with exertion, wheezes GastrointestinaI: No nausea, vomiting, diarrhea, abdominal pain, fecal incontinence Genitourinary:  No dysuria, urinary retention or frequency Musculoskeletal:  No neck pain, back pain Integumentary: No rash, pruritus, skin lesions Neurological: as above Psychiatric: No depression, insomnia, anxiety Endocrine: No palpitations, fatigue, diaphoresis, mood swings, change in appetite, change in weight, increased thirst Hematologic/Lymphatic:  No purpura, petechiae. Allergic/Immunologic: no itchy/runny eyes, nasal congestion, recent allergic reactions, rashes  PHYSICAL EXAM: Blood pressure (!) 144/82, pulse 63, temperature 97.6 F (36.4 C), height 5\' 11"  (1.803 m), weight 221 lb 12.8 oz (100.6 kg), SpO2 96 %. General: No acute distress.  Patient appears well-groomed.   Head:  Normocephalic/atraumatic Eyes:  fundi examined but not visualized Neck: supple, no paraspinal tenderness, full range of motion Back: No paraspinal tenderness Heart: regular rate and rhythm Lungs: Clear to auscultation bilaterally. Vascular: No carotid bruits. Neurological Exam: Mental status: alert and oriented to person, place, and time, recent and remote memory intact, fund of knowledge intact, attention and concentration intact, speech  fluent and not dysarthric, language intact. Cranial nerves: CN I: not tested CN II: pupils equal, round and reactive to light, visual fields intact CN III, IV, VI:  full  range of motion, no nystagmus, no ptosis CN V: facial sensation intact CN VII: upper and lower face symmetric CN VIII: hearing intact CN IX, X: gag intact, uvula midline CN XI: sternocleidomastoid and trapezius muscles intact CN XII: tongue midline Bulk & Tone: increased left upper extremity tone (secondary to CVA), no fasciculations. Motor:  4+/5 left deltoid and 5-/5 left hip flexion, otherwise 5/5 Sensation:  Pinprick and vibration sensation intact. Deep Tendon Reflexes:  3+ left upper extremity, otherwise 2+ throughout, toes downgoing. Finger to nose testing:  Without dysmetria.  Heel to shin:  Without dysmetria.  Gait:  Cautious wide-based gait.  Able to turn.  Romberg negative.  IMPRESSION: 1.  Bilateral leg pain and weakness, specifically calves.  Mostly noticeable when standing and walking.  Consider lumbar stenosis vs myalgia (questionable if may be from Zetia or some primary myopathy).  Unclear of significance of vascular studies.  However, likely aggravated by deconditioning and lack of exercise.   PLAN: 1.  Will check CK and NCV-EMG of lower extremities 2.  Will obtain any recent imaging of lumbar spine from his orthopedist/pain management physician 3.  Further recommendations pending results but will likely recommend physical therapy 4.  Follow up after testing.  Thank you for allowing me to take part in the care of this patient.  Metta Clines, DO

## 2019-01-09 ENCOUNTER — Other Ambulatory Visit: Payer: Self-pay

## 2019-01-09 ENCOUNTER — Other Ambulatory Visit (INDEPENDENT_AMBULATORY_CARE_PROVIDER_SITE_OTHER): Payer: PPO

## 2019-01-09 ENCOUNTER — Encounter: Payer: Self-pay | Admitting: Neurology

## 2019-01-09 ENCOUNTER — Ambulatory Visit (INDEPENDENT_AMBULATORY_CARE_PROVIDER_SITE_OTHER): Payer: PPO | Admitting: Neurology

## 2019-01-09 VITALS — BP 144/82 | HR 63 | Temp 97.6°F | Ht 71.0 in | Wt 221.8 lb

## 2019-01-09 DIAGNOSIS — M79604 Pain in right leg: Secondary | ICD-10-CM

## 2019-01-09 DIAGNOSIS — M79605 Pain in left leg: Secondary | ICD-10-CM

## 2019-01-09 DIAGNOSIS — R29898 Other symptoms and signs involving the musculoskeletal system: Secondary | ICD-10-CM | POA: Diagnosis not present

## 2019-01-09 NOTE — Patient Instructions (Addendum)
1.  We will check a CK level 2.  We will order a nerve conduction study on the legs. 3.  Will likely recommend physical therapy 4.  Follow up after testing.   Stop by check out to get checked into lab on 2nd floor at [place].

## 2019-01-10 ENCOUNTER — Ambulatory Visit (INDEPENDENT_AMBULATORY_CARE_PROVIDER_SITE_OTHER): Payer: PPO | Admitting: Psychiatry

## 2019-01-10 DIAGNOSIS — F4323 Adjustment disorder with mixed anxiety and depressed mood: Secondary | ICD-10-CM

## 2019-01-10 DIAGNOSIS — F0630 Mood disorder due to known physiological condition, unspecified: Secondary | ICD-10-CM | POA: Diagnosis not present

## 2019-01-10 LAB — CK: Total CK: 28 U/L — ABNORMAL LOW (ref 44–196)

## 2019-01-10 MED ORDER — CARBAMAZEPINE ER 100 MG PO TB12: ORAL_TABLET | ORAL | 2 refills | Status: DC

## 2019-01-10 MED ORDER — ESZOPICLONE 3 MG PO TABS: ORAL_TABLET | ORAL | 2 refills | Status: DC

## 2019-01-10 MED ORDER — LORAZEPAM 0.5 MG PO TABS: ORAL_TABLET | ORAL | 5 refills | Status: DC

## 2019-01-10 NOTE — Progress Notes (Signed)
Patient ID: William [name], male   DOB: 12/01/1940, 78 y.o.   MRN: [id number] [facility] MD Progress Note  01/10/2019 3:06 PM William [name]  MRN:  [id number] Subjective:  Feeling good Principal Problem: Mild neurocognitive disorder  Today the patient is interviewed over the phone.  Patient's wife was also part of the interview.  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  The patient's had a history of some mood instability which is much better taking Tegretol.  He also has mild anxiety which is well treated with Ativan.  The patient is sleeping and eating well.  Unfortunately had a fall.  He has difficulty with his balance.  He is been taking shower and fell between the commode and the shower stall.  The took 911 EMS to get him to stand up.  Fortunately he did not hurt himself.  He broke nothing.  The patient has isolated episodes where he has difficulty with his balance.  He also describes a number of other neurological symptoms.  The patient was seen by Dr. Rowe Daniil for his imbalance in his fall.  He is being evaluated and worked up for this problem.  Emotionally the patient is very stable.  He is positive and optimistic.  He is concerned about the virus especially regard to his 2 grandsons ages 65 and 44.  But overall he is positive that things are going to get better.  He denies daily depression or extreme anxiety.  He has no evidence of psychosis.  He denies being suicidal or homicidal.  According to his wife the patient emotionally is stable but cognitively seems to be sliding.  He is having some trouble getting dressed and needs some assistance.  He no longer drives.  He watches movies with his wife but sometimes has problems following it.  The patient drinks no alcohol uses no drugs.  Otherwise his health is fairly good.  His wife is very supportive.   Past Medical History:  Past Medical History:  Diagnosis Date  . ANXIETY   . Arthritis    low back  . Bladder stone   . BPH  (benign prostatic hypertrophy)   . Carotid artery occlusion   . 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 CVD (cerebrovascular disease)   . Eczema   . GERD (gastroesophageal reflux disease)   . History of carotid artery stenosis    S/P BILATERAL CEA  . 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 Hyperlipidemia   . Hypertension   . Nocturia   . S/P carotid endarterectomy    BILATERAL ICA--  PATENT PER DUPLEX  05-19-2012  . Squamous cell carcinoma    right calf  . Stroke [facility]) 1996   pt states no weaknes but has severe anxiety  . Urinary frequency   . Vitamin D deficiency     Past Surgical History:  Procedure Laterality Date  . APPENDECTOMY  AS CHILD  . CARDIOVASCULAR STRESS TEST  03-27-2012  DR CRENSHAW   LOW RISK LEXISCAN STUDY-- PROBABLE NORMAL PERFUSION AND SOFT TISSUE ATTENUATION/  NO ISCHEMIA/ EF 51%  . CAROTID ENDARTERECTOMY Bilateral LEFT  11-12-2008  DR GREG HAYES   RIGHT ICA  2006  ([facility])  . CYSTOSCOPY WITH LITHOLAPAXY N/A 02/26/2013   Procedure: CYSTOSCOPY WITH LITHOLAPAXY;  Surgeon: Franchot Gallo, MD;  Location: [facility];  Service: Urology;  Laterality: N/A;  . EYE SURGERY  Jan. 2016  cataract surgery both eyes  . INGUINAL HERNIA REPAIR Right 11-08-2006  . MASS EXCISION N/A 03/03/2016   Procedure: EXCISION OF BACK  MASS;  Surgeon: Stark Klein, MD;  Location: [place];  Service: General;  Laterality: N/A;  . 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 PROSTATE SURGERY    . TRANSURETHRAL RESECTION OF PROSTATE N/A 02/26/2013   Procedure: TRANSURETHRAL RESECTION OF THE PROSTATE WITH GYRUS INSTRUMENTS;  Surgeon: Franchot Gallo, MD;  Location: [facility];  Service: Urology;  Laterality: N/A;   Family History:  Family History  Problem Relation Age of Onset  . Heart disease Mother        CHF  . Bipolar disorder Mother   . Heart disease Father        CHF    Family Psychiatric  History:  Social History:  Social History   Substance and Sexual Activity  Alcohol Use Yes  . Alcohol/week: 0.0 standard drinks   Comment: Occasional     Social History   Substance and Sexual Activity  Drug Use No    Social History   Socioeconomic History  . Marital status: Married    Spouse name: Not on file  . Number of children: 2  . Years of education: Not on file  . Highest education level: Not on file  Occupational History    Employer: Retired  Scientific laboratory technician  . Financial resource strain: Not on file  . Food insecurity    Worry: Not on file    Inability: Not on file  . Transportation needs    Medical: Not on file    Non-medical: Not on file  Tobacco Use  . Smoking status: Former Smoker    Packs/day: 2.00    Years: 40.00    Pack years: 80.00    Types: Cigarettes    Quit date: 02/15/2005    Years since quitting: 13.9  . Smokeless tobacco: Never Used  Substance and Sexual Activity  . Alcohol use: Yes    Alcohol/week: 0.0 standard drinks    Comment: Occasional  . Drug use: No  . Sexual activity: Yes    Partners: Female  Lifestyle  . Physical activity    Days per week: Not on file    Minutes per session: Not on file  . Stress: Not on file  Relationships  . Social Herbalist on phone: Not on file    Gets together: Not on file    Attends religious service: Not on file    Active member of club or organization: Not on file    Attends meetings of clubs or organizations: Not on file    Relationship status: Not on file  Other Topics Concern  . Not on file  Social History Narrative   Exercise--  Walks dogs everyday      LIves with wife , no stairs in home, caffeine - one cup coffee day, exercise - not much, Right handed, 12th grade, retired   Additional Social History:                         Sleep: Good  Appetite:  Good  Current Medications: Current Outpatient Medications  Medication Sig Dispense Refill  .  acetaminophen (TYLENOL) 500 MG tablet Take 500 mg by mouth every 6 (six) hours as needed (1 tab as needed).    [name] amLODipine (NORVASC) 5 MG tablet Take 1 tablet (5  mg total) by mouth daily. 90 tablet 1  . aspirin 325 MG EC tablet Take 325 mg by mouth daily.    . Calcium Carb-Cholecalciferol (CALCIUM 1000 + D PO) Take by mouth daily.      . carbamazepine (TEGRETOL-XR) 100 MG 12 hr tablet 1 qam  1  qhs 180 tablet 2  . Cholecalciferol (VITAMIN D) 2000 UNITS CAPS Take by mouth.    . Eszopiclone 3 MG TABS TAKE 1 TABLET BY MOUTH AT BEDTIME AS NEEDED TAKE IMMEDIATELY BEFORE BEDTIME 90 tablet 2  . ezetimibe (ZETIA) 10 MG tablet TAKE 1 TABLET (10 MG TOTAL) BY MOUTH DAILY. 90 tablet 3  . famotidine (PEPCID) 20 MG tablet Take 20 mg by mouth 2 (two) times daily.    . fenofibrate 160 MG tablet TAKE 1 TABLET BY MOUTH DAILY. REPEAT LABS ARE DUE NOW 90 tablet 1  . Flaxseed, Linseed, (FLAX SEED OIL PO) Take by mouth daily.      . fluticasone (CUTIVATE) 0.05 % cream   3  . lisinopril-hydrochlorothiazide (ZESTORETIC) 10-12.5 MG tablet Take 1 tablet by mouth daily.    [name] LORazepam (ATIVAN) 0.5 MG tablet 2  Bid  1 as needed 150 tablet 5  . Multiple Vitamin (MULTIVITAMIN) tablet Take 1 tablet by mouth daily.    . NONFORMULARY OR COMPOUNDED ITEM [facility]:  Onychomycosis Nail lacquer - Fluconazole 2%, Terbinafine 1%, DMSO, apply to affected area daily. 120 each 2  . NONFORMULARY OR COMPOUNDED ITEM [facility]:  Antifungal topical - Terbinafine 3%, Fluconazole 2%, Tea Tree Oil 5%, Urea 10/%,, Ibuprofen 2% in DMSO Suspension #64ml. Apply to the affected nail(s) once (at bedtime) or twice daily. 100 each 2  . NONFORMULARY OR COMPOUNDED ITEM Compression socks  #1  Dx low ext edema and ache 1 each 0  . traMADol (ULTRAM) 50 MG tablet Take 1 tablet (50 mg total) by mouth every 8 (eight) hours as needed. (Patient not taking: Reported on 01/09/2019) 30 tablet 0  . Zoster Vaccine Adjuvanted [facility]) injection 0.5  ml IM x1 repeat in 2 months 0.5 mL 1   No current facility-administered medications for this visit.     Lab Results:  Results for orders placed or performed in visit on 01/09/19 (from the past 48 hour(s))  CK (Creatine Kinase)     Status: Abnormal   Collection Time: 01/09/19  8:40 AM  Result Value Ref Range   Total CK 28 (L) 44 - 196 U/L    Physical Findings: AIMS:  , ,  ,  ,    CIWA:    COWS:     Musculoskeletal: Strength & Muscle Tone: within normal limits Gait & Station: normal Patient leans: Right  Psychiatric Specialty Exam: ROS  There were no vitals taken for this visit.There is no height or weight on file to calculate BMI.  General Appearance: Casual  Eye Contact::  Good  Speech:  Clear and Coherent  Volume:  Normal  Mood:  Dysphoric and Euthymic  Affect:  Appropriate  Thought Process:  Coherent  Orientation:  Full (Time, Place, and Person)  Thought Content:  WDL  Suicidal Thoughts:  No  Homicidal Thoughts:  No  Memory:  NA  Judgement:  Good  Insight:  Fair  Psychomotor Activity:  Normal  Concentration:  Good  Recall:  Good  Fund of Knowledge:Good  Language: Good  Akathisia:  No  Handed:  Right  AIMS (if indicated):     Assets:  Desire for Improvement  ADL's:  Intact  Cognition: WNL  Sleep:      Treatment Plan Summary: 01/10/2019, 3:06 PM  This patient has 3 problems.  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  He takes Tegretol and has a good response.  His mood is even and stable.  He is not irritable.  His second problem is insomnia.  The patient takes [place].  He is doing well with this agent.  His third problem is that of anxiety.  He takes a fixed dose of benzodiazepines Ativan does very well.  He actually claims his depression is minimal and he has no anxiety.  He seems to be functioning well.  I will see him back in 3 months but I will continue all the medications he is taking.  At that time we will evaluate him to getting some  blood work including a Tegretol blood level.

## 2019-01-11 ENCOUNTER — Telehealth: Payer: Self-pay

## 2019-01-11 ENCOUNTER — Ambulatory Visit (HOSPITAL_COMMUNITY): Payer: PPO | Admitting: Psychiatry

## 2019-01-11 NOTE — Telephone Encounter (Signed)
-----   Message from Pieter Partridge, DO sent at 01/10/2019  8:05 AM EDT ----- Blood work shows no evidence of muscle damage.  We will see what the nerve and muscle study shows.

## 2019-01-11 NOTE — Telephone Encounter (Signed)
Called spoke with spoke with patient spouse she was informed of results and let patient know as well.

## 2019-01-15 ENCOUNTER — Other Ambulatory Visit: Payer: Self-pay | Admitting: Family Medicine

## 2019-01-15 MED FILL — AMLODIPINE BESYLATE 5 MG TA: 5 | 30 days supply | Qty: 30 | Fill #1

## 2019-01-15 MED FILL — LISINOPRIL-HCTZ 10-12.5 MG: 10-12.5 | 30 days supply | Qty: 30 | Fill #3

## 2019-01-26 ENCOUNTER — Ambulatory Visit (INDEPENDENT_AMBULATORY_CARE_PROVIDER_SITE_OTHER): Payer: PPO | Admitting: Family Medicine

## 2019-01-26 ENCOUNTER — Other Ambulatory Visit: Payer: Self-pay

## 2019-01-26 ENCOUNTER — Encounter: Payer: Self-pay | Admitting: Family Medicine

## 2019-01-26 VITALS — BP 133/72 | HR 80 | Temp 96.8°F | Resp 18 | Ht 71.0 in | Wt 220.8 lb

## 2019-01-26 DIAGNOSIS — R3911 Hesitancy of micturition: Secondary | ICD-10-CM | POA: Diagnosis not present

## 2019-01-26 DIAGNOSIS — I10 Essential (primary) hypertension: Secondary | ICD-10-CM | POA: Diagnosis not present

## 2019-01-26 DIAGNOSIS — N401 Enlarged prostate with lower urinary tract symptoms: Secondary | ICD-10-CM

## 2019-01-26 DIAGNOSIS — Z0000 Encounter for general adult medical examination without abnormal findings: Secondary | ICD-10-CM

## 2019-01-26 DIAGNOSIS — E785 Hyperlipidemia, unspecified: Secondary | ICD-10-CM | POA: Diagnosis not present

## 2019-01-26 LAB — COMPREHENSIVE METABOLIC PANEL
ALT: 27 U/L (ref 0–53)
AST: 26 U/L (ref 0–37)
Albumin: 4 g/dL (ref 3.5–5.2)
Alkaline Phosphatase: 50 U/L (ref 39–117)
BUN: 40 mg/dL — ABNORMAL HIGH (ref 6–23)
CO2: 28 mEq/L (ref 19–32)
Calcium: 9.7 mg/dL (ref 8.4–10.5)
Chloride: 103 mEq/L (ref 96–112)
Creatinine, Ser: 2.04 mg/dL — ABNORMAL HIGH (ref 0.40–1.50)
GFR: 31.87 mL/min — ABNORMAL LOW (ref >60.00)
Glucose, Bld: 98 mg/dL (ref 70–99)
Potassium: 4.1 mEq/L (ref 3.5–5.1)
Sodium: 140 mEq/L (ref 135–145)
Total Bilirubin: 0.4 mg/dL (ref 0.2–1.2)
Total Protein: 7.1 g/dL (ref 6.0–8.3)

## 2019-01-26 LAB — CBC WITH DIFFERENTIAL/PLATELET
Basophils Absolute: 0.1 10*3/uL (ref 0.0–0.1)
Basophils Relative: 0.8 % (ref 0.0–3.0)
Eosinophils Absolute: 0.2 10*3/uL (ref 0.0–0.7)
Eosinophils Relative: 2.7 % (ref 0.0–5.0)
HCT: 37.1 % — ABNORMAL LOW (ref 39.0–52.0)
Hemoglobin: 12.8 g/dL — ABNORMAL LOW (ref 13.0–17.0)
Lymphocytes Relative: 23.1 % (ref 12.0–46.0)
Lymphs Abs: 2 10*3/uL (ref 0.7–4.0)
MCHC: 34.5 g/dL (ref 30.0–36.0)
MCV: 93.2 fl (ref 78.0–100.0)
Monocytes Absolute: 0.7 10*3/uL (ref 0.1–1.0)
Monocytes Relative: 7.7 % (ref 3.0–12.0)
Neutro Abs: 5.7 10*3/uL (ref 1.4–7.7)
Neutrophils Relative %: 65.7 % (ref 43.0–77.0)
Platelets: 289 10*3/uL (ref 150.0–400.0)
RBC: 3.98 Mil/uL — ABNORMAL LOW (ref 4.22–5.81)
RDW: 14.3 % (ref 11.5–15.5)
WBC: 8.6 10*3/uL (ref 4.0–10.5)

## 2019-01-26 LAB — LIPID PANEL
Cholesterol: 178 mg/dL (ref 0–200)
HDL: 54.5 mg/dL (ref >39.00)
LDL Cholesterol: 104 mg/dL — ABNORMAL HIGH (ref 0–99)
NonHDL: 123.54
Total CHOL/HDL Ratio: 3
Triglycerides: 98 mg/dL (ref 0.0–149.0)
VLDL: 19.6 mg/dL (ref 0.0–40.0)

## 2019-01-26 MED ORDER — TAMSULOSIN HCL 0.4 MG PO CAPS: 0.4000 mg | ORAL_CAPSULE | Freq: Every day | ORAL | 3 refills | Status: DC

## 2019-01-26 MED FILL — TAMSULOSIN HCL 0.4 MG CAP: 0.4 | 90 days supply | Qty: 90 | Fill #0

## 2019-01-26 NOTE — Assessment & Plan Note (Signed)
Well controlled, no changes to meds. Encouraged heart healthy diet such as the DASH diet and exercise as tolerated.  °

## 2019-01-26 NOTE — Assessment & Plan Note (Signed)
ghm utd Check labs  See AVS Flu shot to be given next month

## 2019-01-26 NOTE — Assessment & Plan Note (Signed)
Per urology In urology's last night it stated he was still taking it I encouraged the pt to f/u with urology but I will refill the flomax

## 2019-01-26 NOTE — Assessment & Plan Note (Signed)
Tolerating statin, encouraged heart healthy diet, avoid trans fats, minimize simple carbs and saturated fats. Increase exercise as tolerated 

## 2019-01-26 NOTE — Patient Instructions (Signed)
Preventive Care 76 Years and Older, Male Preventive care refers to lifestyle choices and visits with your health care provider that can promote health and wellness. This includes:  A yearly physical exam. This is also called an annual well check.  Regular dental and eye exams.  Immunizations.  Screening for certain conditions.  Healthy lifestyle choices, such as diet and exercise. What can I expect for my preventive care visit? Physical exam Your health care provider will check:  Height and weight. These may be used to calculate body mass index (BMI), which is a measurement that tells if you are at a healthy weight.  Heart rate and blood pressure.  Your skin for abnormal spots. Counseling Your health care provider may ask you questions about:  Alcohol, tobacco, and drug use.  Emotional well-being.  Home and relationship well-being.  Sexual activity.  Eating habits.  History of falls.  Memory and ability to understand (cognition).  Work and work Statistician. What immunizations do I need?  Influenza (flu) vaccine  This is recommended every year. Tetanus, diphtheria, and pertussis (Tdap) vaccine  You may need a Td booster every 10 years. Varicella (chickenpox) vaccine  You may need this vaccine if you have not already been vaccinated. Zoster (shingles) vaccine  You may need this after age 50. Pneumococcal conjugate (PCV13) vaccine  One dose is recommended after age 24. Pneumococcal polysaccharide (PPSV23) vaccine  One dose is recommended after age 33. Measles, mumps, and rubella (MMR) vaccine  You may need at least one dose of MMR if you were born in 1957 or later. You may also need a second dose. Meningococcal conjugate (MenACWY) vaccine  You may need this if you have certain conditions. Hepatitis A vaccine  You may need this if you have certain conditions or if you travel or work in places where you may be exposed to hepatitis A. Hepatitis B vaccine   You may need this if you have certain conditions or if you travel or work in places where you may be exposed to hepatitis B. Haemophilus influenzae type b (Hib) vaccine  You may need this if you have certain conditions. You may receive vaccines as individual doses or as more than one vaccine together in one shot (combination vaccines). Talk with your health care provider about the risks and benefits of combination vaccines. What tests do I need? Blood tests  Lipid and cholesterol levels. These may be checked every 5 years, or more frequently depending on your overall health.  Hepatitis C test.  Hepatitis B test. Screening  Lung cancer screening. You may have this screening every year starting at age 74 if you have a 30-pack-year history of smoking and currently smoke or have quit within the past 15 years.  Colorectal cancer screening. All adults should have this screening starting at age 57 and continuing until age 54. Your health care provider may recommend screening at age 47 if you are at increased risk. You will have tests every 1-10 years, depending on your results and the type of screening test.  Prostate cancer screening. Recommendations will vary depending on your family history and other risks.  Diabetes screening. This is done by checking your blood sugar (glucose) after you have not eaten for a while (fasting). You may have this done every 1-3 years.  Abdominal aortic aneurysm (AAA) screening. You may need this if you are a current or former smoker.  Sexually transmitted disease (STD) testing. Follow these instructions at home: Eating and drinking  Eat  a diet that includes fresh fruits and vegetables, whole grains, lean protein, and low-fat dairy products. Limit your intake of foods with high amounts of sugar, saturated fats, and salt.  Take vitamin and mineral supplements as recommended by your health care provider.  Do not drink alcohol if your health care provider  tells you not to drink.  If you drink alcohol: ? Limit how much you have to 0-2 drinks a day. ? Be aware of how much alcohol is in your drink. In the [place] one drink equals one 12 oz bottle of beer (355 mL), one 5 oz glass of wine (148 mL), or one 1 oz glass of hard liquor (44 mL). Lifestyle  Take daily care of your teeth and gums.  Stay active. Exercise for at least 30 minutes on 5 or more days each week.  Do not use any products that contain nicotine or tobacco, such as cigarettes, e-cigarettes, and chewing tobacco. If you need help quitting, ask your health care provider.  If you are sexually active, practice safe sex. Use a condom or other form of protection to prevent STIs (sexually transmitted infections).  Talk with your health care provider about taking a low-dose aspirin or statin. What's next?  Visit your health care provider once a year for a well check visit.  Ask your health care provider how often you should have your eyes and teeth checked.  Stay up to date on all vaccines. This information is not intended to replace advice given to you by your health care provider. Make sure you discuss any questions you have with your health care provider. Document Released: 06/27/2015 Document Revised: 05/25/2018 Document Reviewed: 05/25/2018 Elsevier Patient Education  2020 Elsevier Inc.  

## 2019-01-26 NOTE — Progress Notes (Signed)
Patient ID: William [name], male    DOB: [birth year]  Age: 78 y.o. MRN: [id number]    Subjective:  Subjective  HPI William [name] presents for cpe   No new complaints.   Pt is having trouble with urinating since stopping flomax.  They are not sure why it was stopped.   Review of Systems  Constitutional: Negative.  Negative for appetite change, diaphoresis, fatigue and unexpected weight change.  HENT: Negative for congestion, ear pain, hearing loss, nosebleeds, postnasal drip, rhinorrhea, sinus pressure, sneezing and tinnitus.   Eyes: Negative for photophobia, pain, discharge, redness, itching and visual disturbance.  Respiratory: Negative.  Negative for cough, chest tightness, shortness of breath and wheezing.   Cardiovascular: Negative.  Negative for chest pain, palpitations and leg swelling.  Gastrointestinal: Negative for abdominal distention, abdominal pain, anal bleeding, blood in stool and constipation.  Endocrine: Negative.  Negative for cold intolerance, heat intolerance, polydipsia, polyphagia and polyuria.  Genitourinary: Positive for decreased urine volume. Negative for difficulty urinating, dysuria and frequency.  Musculoskeletal: Negative.   Skin: Negative.   Allergic/Immunologic: Negative.   Neurological: Negative for dizziness, weakness, light-headedness, numbness and headaches.  Psychiatric/Behavioral: Negative for agitation, confusion, decreased concentration, dysphoric mood, sleep disturbance and suicidal ideas. The patient is not nervous/anxious.     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   Hyperlipidemia    Hypertension    Nocturia    S/P carotid endarterectomy    BILATERAL ICA--  PATENT PER DUPLEX  05-19-2012   Squamous cell carcinoma    right calf   Stroke ([place]) 1996   pt states no weaknes but has severe anxiety   Urinary frequency    Vitamin D deficiency     He has a past surgical history that includes Carotid endarterectomy (Bilateral, LEFT  11-12-2008  DR GREG HAYES); Inguinal hernia repair (Right, 11-08-2006); Cardiovascular stress test (03-27-2012  DR CRENSHAW); Appendectomy (AS CHILD); Cystoscopy with litholapaxy (N/A, 02/26/2013); Transurethral resection of prostate (N/A, 02/26/2013); Prostate surgery; Mohs surgery (Left, 1/ 2016); Eye surgery (Jan. 2016); and Mass excision (N/A, 03/03/2016).   His family history includes Bipolar disorder in his mother; Heart disease in his father and mother.He reports that he quit smoking about 13 years ago. His smoking use included cigarettes. He has a 80.00 pack-year smoking history. He has never used smokeless tobacco. He reports current alcohol use. He reports that he does not use drugs.  Current Outpatient Medications on File Prior to Visit  Medication Sig Dispense Refill   acetaminophen (TYLENOL) 500 MG tablet Take 500 mg by mouth every 6 (six) hours as needed (1 tab as needed).     amLODipine (NORVASC) 5 MG tablet TAKE 1 TABLET (5 MG TOTAL) BY MOUTH DAILY. 90 tablet 1   aspirin 325 MG EC tablet Take 325 mg by mouth daily.     Calcium Carb-Cholecalciferol (CALCIUM 1000 + D PO) Take by mouth daily.       carbamazepine (TEGRETOL-XR) 100 MG 12 hr tablet 1 qam  1  qhs 180 tablet 2   Cholecalciferol (VITAMIN D) 2000 UNITS CAPS Take by mouth.     Eszopiclone 3 MG TABS TAKE 1 TABLET BY MOUTH AT BEDTIME AS NEEDED TAKE IMMEDIATELY BEFORE BEDTIME 90 tablet 2   ezetimibe (ZETIA) 10 MG tablet TAKE 1 TABLET (10 MG TOTAL) BY MOUTH DAILY. 90 tablet 3   famotidine (PEPCID) 20 MG tablet Take 20 mg  by mouth 2 (two) times daily.     fenofibrate 160 MG tablet TAKE 1 TABLET BY MOUTH DAILY. REPEAT LABS ARE DUE NOW 90 tablet 1   Flaxseed, Linseed, (FLAX SEED OIL PO) Take by mouth daily.       fluticasone (CUTIVATE) 0.05 % cream   3   lisinopril-hydrochlorothiazide (ZESTORETIC) 10-12.5 MG tablet Take 1 tablet by mouth daily.     LORazepam (ATIVAN) 0.5 MG tablet 2  Bid  1 as needed 150 tablet 5   Multiple Vitamin (MULTIVITAMIN) tablet Take 1 tablet by mouth daily.     NONFORMULARY OR COMPOUNDED ITEM [facility]:  Onychomycosis Nail lacquer - Fluconazole 2%, Terbinafine 1%, DMSO, apply to affected area daily. 120 each 2   NONFORMULARY OR COMPOUNDED ITEM [facility]:  Antifungal topical - Terbinafine 3%, Fluconazole 2%, Tea Tree Oil 5%, Urea 10/%,, Ibuprofen 2% in DMSO Suspension #59ml. Apply to the affected nail(s) once (at bedtime) or twice daily. 100 each 2   NONFORMULARY OR COMPOUNDED ITEM Compression socks  #1  Dx low ext edema and ache 1 each 0   traMADol (ULTRAM) 50 MG tablet Take 1 tablet (50 mg total) by mouth every 8 (eight) hours as needed. 30 tablet 0   Zoster Vaccine Adjuvanted [facility]) injection 0.5 ml IM x1 repeat in 2 months 0.5 mL 1   No current facility-administered medications on file prior to visit.      Objective:  Objective  Physical Exam Constitutional:      General: He is not in acute distress.    Appearance: He is well-developed. He is not diaphoretic.  HENT:     Head: Normocephalic and atraumatic.     Right Ear: External ear normal.     Left Ear: External ear normal.     Nose: Nose normal.     Mouth/Throat:     Pharynx: No oropharyngeal exudate.  Eyes:     General:        Right eye: No discharge.        Left eye: No discharge.     Conjunctiva/sclera: Conjunctivae normal.     Pupils: Pupils are equal, round, and reactive to light.  Neck:     Musculoskeletal: Normal range of motion and neck supple.     Thyroid: No thyromegaly.      Vascular: No JVD.  Cardiovascular:     Rate and Rhythm: Normal rate and regular rhythm.     Heart sounds: No murmur. No friction rub. No gallop.   Pulmonary:     Effort: Pulmonary effort is normal. No respiratory distress.     Breath sounds: Normal breath sounds. No wheezing or rales.  Chest:     Chest wall: No tenderness.  Abdominal:     General: Bowel sounds are normal. There is no distension.     Palpations: Abdomen is soft. There is no mass.     Tenderness: There is no abdominal tenderness. There is no guarding or rebound.  Genitourinary:    Comments: Per urology Musculoskeletal: Normal range of motion.  General: No tenderness.  Lymphadenopathy:     Cervical: No cervical adenopathy.  Skin:    General: Skin is warm and dry.     Coloration: Skin is not pale.     Findings: No erythema or rash.  Neurological:     Mental Status: He is alert and oriented to person, place, and time.     Motor: No abnormal muscle tone.     Deep Tendon Reflexes: Reflexes are normal and symmetric. Reflexes normal.  Psychiatric:        Behavior: Behavior normal.        Thought Content: Thought content normal.        Judgment: Judgment normal.    BP 133/72 (BP Location: Left Arm, Patient Position: Sitting, Cuff Size: Normal)    Pulse 80    Temp (!) 96.8 F (36 C) (Temporal)    Resp 18    Ht 5\' 11"  (1.803 m)    Wt 220 lb 12.8 oz (100.2 kg)    SpO2 98%    BMI 30.80 kg/m  Wt Readings from Last 3 Encounters:  01/26/19 220 lb 12.8 oz (100.2 kg)  01/09/19 221 lb 12.8 oz (100.6 kg)  11/24/18 222 lb 9.6 oz (101 kg)     Lab Results  Component Value Date   WBC 6.4 09/28/2017   HGB 13.2 09/28/2017   HCT 38.9 (L) 09/28/2017   PLT 241.0 09/28/2017   GLUCOSE 98 07/18/2018   CHOL 176 06/16/2018   TRIG 75.0 06/16/2018   HDL 56.00 06/16/2018   LDLCALC 105 (H) 06/16/2018   ALT 19 06/16/2018   AST 18 06/16/2018   NA 139 07/18/2018   K 4.1 07/18/2018   CL 103 07/18/2018   CREATININE 1.66 (H)  07/18/2018   BUN 29 (H) 07/18/2018   CO2 25 07/18/2018   TSH 1.668 10/16/2013   PSA 2.10 12/24/2016   INR 1.0 11/08/2008   HGBA1C 5.8 03/27/2018    Vas [place] Abi With/wo Tbi  Result Date: 12/13/2018 LOWER EXTREMITY DOPPLER STUDY Indications: Bilateral leg pain when walking. High Risk Factors: Hypertension, hyperlipidemia, past history of smoking.  Performing Technologist: Cardell Peach RDCS, RVT  Examination Guidelines: A complete evaluation includes at minimum, Doppler waveform signals and systolic blood pressure reading at the level of bilateral brachial, anterior tibial, and posterior tibial arteries, when vessel segments are accessible. Bilateral testing is considered an integral part of a complete examination. Photoelectric Plethysmograph (PPG) waveforms and toe systolic pressure readings are included as required and additional duplex testing as needed. Limited examinations for reoccurring indications may be performed as noted.  ABI Findings: +---------+------------------+-----+---------+--------+  Right     Rt Pressure (mmHg) Index Waveform  Comment   +---------+------------------+-----+---------+--------+  Brachial  157                      triphasic           +---------+------------------+-----+---------+--------+  ATA       166                1.06  triphasic           +---------+------------------+-----+---------+--------+  PTA       154                0.98  triphasic           +---------+------------------+-----+---------+--------+  Great Toe 102                0.65                      +---------+------------------+-----+---------+--------+ +---------+------------------+-----+---------+-------+  Left      Lt Pressure (mmHg) Index Waveform  Comment  +---------+------------------+-----+---------+-------+  Brachial  156                      triphasic          +---------+------------------+-----+---------+-------+  ATA       149                0.95  triphasic           +---------+------------------+-----+---------+-------+  PTA       168                1.07  triphasic          +---------+------------------+-----+---------+-------+  Great Toe 96                 0.61                     +---------+------------------+-----+---------+-------+  Summary: Right: Resting right ankle-brachial index is within normal range. No evidence of significant right lower extremity arterial disease. The right toe-brachial index is abnormal. Left: Resting left ankle-brachial index is within normal range. No evidence of significant left lower extremity arterial disease. The left toe-brachial index is abnormal.  *See table(s) above for measurements and observations.  Electronically signed by Shirlee More MD on 12/13/2018 at 1:09:53 PM.   Final      Assessment & Plan:  Plan  I am having Bruna Potter start on tamsulosin. I am also having him maintain his (Flaxseed, Linseed, (FLAX SEED OIL PO)), Calcium Carb-Cholecalciferol (CALCIUM 1000 + D PO), aspirin, multivitamin, Vitamin D, fluticasone, NONFORMULARY OR COMPOUNDED ITEM, ezetimibe, traMADol, NONFORMULARY OR COMPOUNDED ITEM, famotidine, lisinopril-hydrochlorothiazide, NONFORMULARY OR COMPOUNDED ITEM, fenofibrate, Shingrix, acetaminophen, carbamazepine, LORazepam, Eszopiclone, and amLODipine.  Meds ordered this encounter  Medications   tamsulosin (FLOMAX) 0.4 MG CAPS capsule    Sig: Take 1 capsule (0.4 mg total) by mouth daily.    Dispense:  90 capsule    Refill:  3    Problem List Items Addressed This Visit      Unprioritized   BPH (benign prostatic hyperplasia) - Primary    Per urology In urology's last night it stated he was still taking it I encouraged the pt to f/u with urology but I will refill the flomax      Relevant Medications   tamsulosin (FLOMAX) 0.4 MG CAPS capsule   HTN (hypertension)    Well controlled, no changes to meds. Encouraged heart healthy diet such as the DASH diet and exercise as tolerated.        Relevant Orders   Lipid panel   CBC with Differential/Platelet   Comprehensive metabolic panel   Hyperlipidemia    Tolerating statin, encouraged heart healthy diet, avoid trans fats, minimize simple carbs and saturated fats. Increase exercise as tolerated      Relevant Orders   Lipid panel   Comprehensive metabolic panel      Follow-up: Return in about 6 months (around 07/29/2019), or if symptoms worsen or fail to improve, for hypertension, hyperlipidemia.  Ann Held, DO

## 2019-01-29 MED FILL — LORazepam 0.5 MG TABS: 0.5 | 30 days supply | Qty: 150 | Fill #3

## 2019-02-01 ENCOUNTER — Ambulatory Visit: Payer: PPO | Admitting: Podiatry

## 2019-02-01 ENCOUNTER — Other Ambulatory Visit: Payer: Self-pay

## 2019-02-01 ENCOUNTER — Encounter: Payer: Self-pay | Admitting: Podiatry

## 2019-02-01 VITALS — Temp 97.8°F

## 2019-02-01 DIAGNOSIS — S90425A Blister (nonthermal), left lesser toe(s), initial encounter: Secondary | ICD-10-CM | POA: Diagnosis not present

## 2019-02-01 DIAGNOSIS — B351 Tinea unguium: Secondary | ICD-10-CM | POA: Diagnosis not present

## 2019-02-01 DIAGNOSIS — M2042 Other hammer toe(s) (acquired), left foot: Secondary | ICD-10-CM

## 2019-02-01 DIAGNOSIS — M79676 Pain in unspecified toe(s): Secondary | ICD-10-CM

## 2019-02-07 NOTE — Progress Notes (Signed)
Subjective: 78 y.o. returns the office today for painful, elongated, thickened toenails which he cannot trim himself. Denies any redness or drainage around the nails.  Using the nail fungus treatment as it was irritating the skin.  This resolved.  He thinks he is starting.  With some topical left second toe but denies any skin breakdown.  No open sores.  No swelling redness.  Denies any systemic complaints such as fevers, chills, nausea, vomiting.   PCP: Carollee Herter, Alferd Apa, DO  Objective: AAO 3, NAD, presents with his wife DP/PT pulses palpable, CRT less than 3 seconds  Nails hypertrophic, dystrophic, elongated, brittle, discolored x 10. There is tenderness overlying the nails 1-5 bilaterally. There is no surrounding erythema or drainage along the nail sites. Very small area of skin irritation the dorsal aspect the left second PIPJ present.  There is no skin breakdown.  No drainage or pus.  No edema, erythema or pain. No open lesions or pre-ulcerative lesions are identified. No pain with calf compression, swelling, warmth, erythema.  Assessment: Patient presents with symptomatic onychomycosis; pre-ulcerative area left second toe  Plan: -Treatment options including alternatives, risks, complications were discussed -Nails sharply debrided 10 without complication/bleeding.  Only nail fungus treatment is is causing irritation. -Offloading pad to left second toe.  Daily foot inspection.  Months.  Further skin breakdown or open sores. -Follow-up in 3 months or sooner if any problems are to arise. In the meantime, encouraged to call the office with any questions, concerns, changes symptoms.  Celesta Gentile, DPM

## 2019-02-08 ENCOUNTER — Other Ambulatory Visit: Payer: Self-pay

## 2019-02-08 ENCOUNTER — Ambulatory Visit (INDEPENDENT_AMBULATORY_CARE_PROVIDER_SITE_OTHER): Payer: PPO | Admitting: Neurology

## 2019-02-08 DIAGNOSIS — M79605 Pain in left leg: Secondary | ICD-10-CM

## 2019-02-08 DIAGNOSIS — R29898 Other symptoms and signs involving the musculoskeletal system: Secondary | ICD-10-CM

## 2019-02-08 DIAGNOSIS — M79604 Pain in right leg: Secondary | ICD-10-CM | POA: Diagnosis not present

## 2019-02-09 NOTE — Procedures (Signed)
[facility]  [address], [place]  [place], [place] [address] Tel: [phone] Fax:  [phone] Test Date:  02/08/2019  Patient: William [name] DOB: 08-Jun-1941 Physician: Narda Amber, DO  Sex: Male Height: 5\' 11"  Ref Phys: Metta Clines, DO  ID#: [id number] Temp: 32.0C Technician:    Patient Complaints: This is a 78 year old man referred for evaluation of bilateral leg weakness.  NCV & EMG Findings: Electrodiagnostic testing of the right lower extremity and additional studies of the left shows: 1. Bilateral sural and superficial peroneal sensory responses are within normal limits. 2. Bilateral peroneal and tibial motor responses are within normal limits. 3. Bilateral tibial H reflex studies are within normal limits. 4. There is no evidence of active or chronic motor axonal changes affecting any of the tested muscles.  Motor unit configuration and recruitment pattern is within normal limits.   Impression: This is a normal study of the lower extremities.  In particular, there is no evidence of a sensorimotor polyneuropathy or lumbosacral radiculopathy.     ___________________________ Narda Amber, DO    Nerve Conduction Studies Anti Sensory Summary Table   Site NR Peak (ms) Norm Peak (ms) P-T Amp (V) Norm P-T Amp  Left Sup Peroneal Anti Sensory (Ant Lat Mall)  32C  12 cm    3.0 <4.6 9.6 >3  Right Sup Peroneal Anti Sensory (Ant Lat Mall)  32C  12 cm    2.8 <4.6 7.7 >3  Left Sural Anti Sensory (Lat Mall)  32C  Calf    3.7 <4.6 7.9 >3  Right Sural Anti Sensory (Lat Mall)  32C  Calf    3.3 <4.6 7.4 >3   Motor Summary Table   Site NR Onset (ms) Norm Onset (ms) O-P Amp (mV) Norm O-P Amp Site1 Site2 Delta-0 (ms) Dist (cm) Vel (m/s) Norm Vel (m/s)  Left Peroneal Motor (Ext Dig Brev)  32C  Ankle    5.2 <6.0 3.2 >2.5 B Fib Ankle 8.6 41.0 48 >40  B Fib    13.8  3.1  Poplt B Fib 2.0 10.0 50 >40  Poplt    15.8  3.0         Right Peroneal Motor (Ext Dig Brev)   32C  Ankle    4.4 <6.0 4.1 >2.5 B Fib Ankle 8.6 41.0 48 >40  B Fib    13.0  3.8  Poplt B Fib 1.5 8.0 53 >40  Poplt    14.5  3.6         Left Tibial Motor (Abd Hall Brev)  32C  Ankle    4.1 <6.0 9.4 >4 Knee Ankle 10.0 44.0 44 >40  Knee    14.1  7.5         Right Tibial Motor (Abd Hall Brev)  32C  Ankle    3.5 <6.0 8.2 >4 Knee Ankle 10.6 42.0 40 >40  Knee    14.1  6.4          H Reflex Studies   NR H-Lat (ms) Lat Norm (ms) L-R H-Lat (ms)  Left Tibial (Gastroc)  32C     34.69 <35 0.14  Right Tibial (Gastroc)  32C     34.83 <35 0.14   EMG   Side Muscle Ins Act Fibs Psw Fasc Number Recrt Dur Dur. Amp Amp. Poly Poly. Comment  Right AntTibialis Nml Nml Nml Nml Nml Nml Nml Nml Nml Nml Nml Nml N/A  Right Gastroc Nml Nml Nml Nml Nml Nml Nml Nml  Nml Nml Nml Nml N/A  Right Flex Dig Long Nml Nml Nml Nml Nml Nml Nml Nml Nml Nml Nml Nml N/A  Right RectFemoris Nml Nml Nml Nml Nml Nml Nml Nml Nml Nml Nml Nml N/A  Right GluteusMed Nml Nml Nml Nml Nml Nml Nml Nml Nml Nml Nml Nml N/A  Left GluteusMed Nml Nml Nml Nml Nml Nml Nml Nml Nml Nml Nml Nml N/A  Left AntTibialis Nml Nml Nml Nml Nml Nml Nml Nml Nml Nml Nml Nml N/A  Left Gastroc Nml Nml Nml Nml Nml Nml Nml Nml Nml Nml Nml Nml N/A  Left Flex Dig Long Nml Nml Nml Nml Nml Nml Nml Nml Nml Nml Nml Nml N/A  Left RectFemoris Nml Nml Nml Nml Nml Nml Nml Nml Nml Nml Nml Nml N/A      Waveforms:

## 2019-02-12 NOTE — Progress Notes (Addendum)
NEUROLOGY FOLLOW UP OFFICE NOTE  AD GUTTMAN [id number]  HISTORY OF PRESENT ILLNESS: William [name] is a 78 year old man with dementia, CKD, HTN, CAD, HLD and history of CVA who follow up for bilateral leg pain and weakness.  UPDATE: CK from 01/09/19 was 28.  NCV-EMG of lower extremities on 02/08/19 was normal without evidence of lumbosacral radiculopathy or myopathy.  No significant changes since last visit.  HISTORY: Prior to COVID, he was going to the gym and seeing a personal trainer but it stopped once the pandemic hit.  Since the pandemic, he has been sedentary.  He walks the dog once or twice a week.  He started having bilateral leg pain around April.  No associated back pain.  No shooting pain.  It is described as an aching in his calves.  No associated numbness.  He also reports weakness.  When he walks, he has trouble picking up his legs.  He seems to shuffle his feet.  When he gets up to stand, he feels like he can fall forward.  He had one fall while standing at the toilet and he bent forward to spit in toilet.  No change in bowel habit.  He was having urinary issues so he was taken off of Flomax.    He had been started on new medications a few months ago.  He was switched from lisinopril to Norvasc and then lisinopril was added again with HCTZ in order to help with the fluid in the legs.    Vascular US ABI from 12/08/18 showed abnormal toe-brachial index in both lower extremities but otherwise unremarkable.  He does have history of low back pain.  Last MRI of lumbar spine from 08/06/10 was personally reviewed and demonstrated mild degenerative changes without significant spinal stenosis or nerve root compression.  He had an epidural injection in the lumbar region several months ago for low back pain which was effective.    He previously had leg pain on a statin so he was switched to Zetia.  It helped for awhile.    Hgb A1c from 03/27/18 was 5.8.    PAST MEDICAL HISTORY:  Past Medical History:  Diagnosis Date  . ANXIETY   . Arthritis    low back  . Bladder stone   . BPH (benign prostatic hypertrophy)   . Carotid artery occlusion   . 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 CVD (cerebrovascular disease)   . Eczema   . GERD (gastroesophageal reflux disease)   . History of carotid artery stenosis    S/P BILATERAL CEA  . 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 Hyperlipidemia   . Hypertension   . Nocturia   . S/P carotid endarterectomy    BILATERAL ICA--  PATENT PER DUPLEX  05-19-2012  . Squamous cell carcinoma    right calf  . Stroke [facility]) 1996   pt states no weaknes but has severe anxiety  . Urinary frequency   . Vitamin D deficiency     MEDICATIONS: Current Outpatient Medications on File Prior to Visit  Medication Sig Dispense Refill  . acetaminophen (TYLENOL) 500 MG tablet Take 500 mg by mouth every 6 (six) hours as needed (1 tab as needed).    [name] amLODipine (NORVASC) 5 MG tablet TAKE 1 TABLET (5 MG TOTAL) BY MOUTH DAILY. 90 tablet 1  . aspirin 325 MG EC tablet Take 325 mg by mouth daily.    [name]  Calcium Carb-Cholecalciferol (CALCIUM 1000 + D PO) Take by mouth daily.      . carbamazepine (TEGRETOL-XR) 100 MG 12 hr tablet 1 qam  1  qhs 180 tablet 2  . Cholecalciferol (VITAMIN D) 2000 UNITS CAPS Take by mouth.    . Eszopiclone 3 MG TABS TAKE 1 TABLET BY MOUTH AT BEDTIME AS NEEDED TAKE IMMEDIATELY BEFORE BEDTIME 90 tablet 2  . ezetimibe (ZETIA) 10 MG tablet TAKE 1 TABLET (10 MG TOTAL) BY MOUTH DAILY. 90 tablet 3  . famotidine (PEPCID) 20 MG tablet Take 20 mg by mouth 2 (two) times daily.    . fenofibrate 160 MG tablet TAKE 1 TABLET BY MOUTH DAILY. REPEAT LABS ARE DUE NOW 90 tablet 1  . Flaxseed, Linseed, (FLAX SEED OIL PO) Take by mouth daily.      . fluticasone (CUTIVATE) 0.05 % cream   3  . lisinopril-hydrochlorothiazide (ZESTORETIC) 10-12.5 MG tablet Take 1 tablet by mouth daily.    [name] LORazepam  (ATIVAN) 0.5 MG tablet 2  Bid  1 as needed 150 tablet 5  . Multiple Vitamin (MULTIVITAMIN) tablet Take 1 tablet by mouth daily.    . NONFORMULARY OR COMPOUNDED ITEM [facility]:  Onychomycosis Nail lacquer - Fluconazole 2%, Terbinafine 1%, DMSO, apply to affected area daily. 120 each 2  . NONFORMULARY OR COMPOUNDED ITEM [facility]:  Antifungal topical - Terbinafine 3%, Fluconazole 2%, Tea Tree Oil 5%, Urea 10/%,, Ibuprofen 2% in DMSO Suspension #54ml. Apply to the affected nail(s) once (at bedtime) or twice daily. 100 each 2  . NONFORMULARY OR COMPOUNDED ITEM Compression socks  #1  Dx low ext edema and ache 1 each 0  . tamsulosin (FLOMAX) 0.4 MG CAPS capsule Take 1 capsule (0.4 mg total) by mouth daily. 90 capsule 3  . traMADol (ULTRAM) 50 MG tablet Take 1 tablet (50 mg total) by mouth every 8 (eight) hours as needed. 30 tablet 0  . Zoster Vaccine Adjuvanted [facility]) injection 0.5 ml IM x1 repeat in 2 months 0.5 mL 1   No current facility-administered medications on file prior to visit.     ALLERGIES: Allergies  Allergen Reactions  . Other Anaphylaxis  . Strawberry Extract Hives  . Adhesive [Tape] Other (See Comments)    BLISTER  . Latex     Reaction to Plastic Tape  . Statins Other (See Comments)    myalgias  . Strawberry (Diagnostic) Hives    FAMILY HISTORY: Family History  Problem Relation Age of Onset  . Heart disease Mother        CHF  . Bipolar disorder Mother   . Heart disease Father        CHF    SOCIAL HISTORY: Social History   Socioeconomic History  . Marital status: Married    Spouse name: Not on file  . Number of children: 2  . Years of education: Not on file  . Highest education level: Not on file  Occupational History    Employer: Retired  Scientific laboratory technician  . Financial resource strain: Not on file  . Food insecurity    Worry: Not on file    Inability: Not on file  . Transportation needs    Medical: Not on file    Non-medical: Not on  file  Tobacco Use  . Smoking status: Former Smoker    Packs/day: 2.00    Years: 40.00    Pack years: 80.00    Types: Cigarettes    Quit date: 02/15/2005  Years since quitting: 14.0  . Smokeless tobacco: Never Used  Substance and Sexual Activity  . Alcohol use: Yes    Alcohol/week: 0.0 standard drinks    Comment: Occasional  . Drug use: No  . Sexual activity: Yes    Partners: Female  Lifestyle  . Physical activity    Days per week: Not on file    Minutes per session: Not on file  . Stress: Not on file  Relationships  . Social Herbalist on phone: Not on file    Gets together: Not on file    Attends religious service: Not on file    Active member of club or organization: Not on file    Attends meetings of clubs or organizations: Not on file    Relationship status: Not on file  . Intimate partner violence    Fear of current or ex partner: Not on file    Emotionally abused: Not on file    Physically abused: Not on file    Forced sexual activity: Not on file  Other Topics Concern  . Not on file  Social History Narrative   Exercise--  Walks dogs everyday      LIves with wife , no stairs in home, caffeine - one cup coffee day, exercise - not much, Right handed, 12th grade, retired    REVIEW OF SYSTEMS: Constitutional: No fevers, chills, or sweats, no generalized fatigue, change in appetite Eyes: No visual changes, double vision, eye pain Ear, nose and throat: No hearing loss, ear pain, nasal congestion, sore throat Cardiovascular: No chest pain, palpitations Respiratory:  No shortness of breath at rest or with exertion, wheezes GastrointestinaI: No nausea, vomiting, diarrhea, abdominal pain, fecal incontinence Genitourinary:  No dysuria, urinary retention or frequency Musculoskeletal:  No neck pain, back pain Integumentary: No rash, pruritus, skin lesions Neurological: as above Psychiatric: No depression, insomnia, anxiety Endocrine: No palpitations, fatigue,  diaphoresis, mood swings, change in appetite, change in weight, increased thirst Hematologic/Lymphatic:  No purpura, petechiae. Allergic/Immunologic: no itchy/runny eyes, nasal congestion, recent allergic reactions, rashes  PHYSICAL EXAM: Blood pressure 130/73, pulse 73, temperature 98 F (36.7 C), height 5' 10.5" (1.791 m), weight 220 lb (99.8 kg), SpO2 98 %. General: No acute distress.  Patient appears well-groomed.   Head:  Normocephalic/atraumatic Eyes:  Fundi examined but not visualized Neck: supple, no paraspinal tenderness, full range of motion Heart:  Regular rate and rhythm Lungs:  Clear to auscultation bilaterally Back: No paraspinal tenderness Neurological Exam: alert and oriented to person, place, and time. Attention span and concentration intact, recent and remote memory intact, fund of knowledge intact.  Speech fluent and not dysarthric, language intact.  CN II-XII intact. Bulk normal.  Increased tone in left upper extremity.  Muscle strength 4+/5 left deltoid and 5-/5 left hip flexion, otherwise 5/5.  Sensation to light touch intact.  Deep tendon reflexes 3+ bilateral upper extremities (left greater than right), 2+ lower extremities, left Hoffman, toes downgoing.  Finger to nose and heel to shin testing intact.  Shuffling gait.  Romberg negative.  IMPRESSION: 1.  Bilateral leg pain and weakness.  No electrodiagnostic evidence of lumbosacral radiculopathy or myopathy.  Questionable if may be from Zetia.  He does have hyperreflexia, prominent in the left upper extremity which is likely secondary to prior CVA.  However, he also exhibits some hyperreflexia in the left upper extremity.  We will check MRI of cervical spine to rule out any myelopathy.  PLAN: MRI of cervical spine  to rule out cervical myelopathy Refer to physical therapy for bilateral leg pain and weakness. Follow up in 3 to 4 months.  15 minutes spent face to face with patient, over 50% spent discussing management.   Metta Clines, DO  CC:  Roma Schanz, DO

## 2019-02-13 ENCOUNTER — Encounter: Payer: Self-pay | Admitting: Neurology

## 2019-02-13 ENCOUNTER — Other Ambulatory Visit: Payer: Self-pay

## 2019-02-13 ENCOUNTER — Telehealth: Payer: Self-pay

## 2019-02-13 ENCOUNTER — Ambulatory Visit (INDEPENDENT_AMBULATORY_CARE_PROVIDER_SITE_OTHER): Payer: PPO | Admitting: Neurology

## 2019-02-13 VITALS — BP 130/73 | HR 73 | Temp 98.0°F | Ht 70.5 in | Wt 220.0 lb

## 2019-02-13 DIAGNOSIS — M79605 Pain in left leg: Secondary | ICD-10-CM | POA: Diagnosis not present

## 2019-02-13 DIAGNOSIS — R29898 Other symptoms and signs involving the musculoskeletal system: Secondary | ICD-10-CM

## 2019-02-13 DIAGNOSIS — M79604 Pain in right leg: Secondary | ICD-10-CM | POA: Diagnosis not present

## 2019-02-13 DIAGNOSIS — Z8673 Personal history of transient ischemic attack (TIA), and cerebral infarction without residual deficits: Secondary | ICD-10-CM

## 2019-02-13 DIAGNOSIS — R292 Abnormal reflex: Secondary | ICD-10-CM

## 2019-02-13 NOTE — Telephone Encounter (Signed)
-----   Message from Pieter Partridge, DO sent at 02/11/2019  3:16 PM EDT ----- Nerve study was normal.  I recommend physical therapy for bilateral leg pain and weakness

## 2019-02-13 NOTE — Telephone Encounter (Signed)
Pat advised and referred to physical therapy

## 2019-02-13 NOTE — Patient Instructions (Signed)
1.  We will check MRI of cervical spine without contrast.  Further recommendations pending results. 2.  We will refer you to physical therapy 3.  Follow up in 3 to 4 months.

## 2019-02-15 ENCOUNTER — Other Ambulatory Visit: Payer: Self-pay

## 2019-02-15 ENCOUNTER — Ambulatory Visit (INDEPENDENT_AMBULATORY_CARE_PROVIDER_SITE_OTHER): Payer: PPO

## 2019-02-15 DIAGNOSIS — Z23 Encounter for immunization: Secondary | ICD-10-CM

## 2019-02-19 MED FILL — LISINOPRIL-HCTZ 10-12.5 MG: 10-12.5 | 30 days supply | Qty: 30 | Fill #4

## 2019-02-20 MED FILL — AMLODIPINE BESYLATE 5 MG TA: 5 | 90 days supply | Qty: 90 | Fill #0

## 2019-02-26 MED FILL — carBAMazepine ER 100 MG TB1: 100 | 90 days supply | Qty: 180 | Fill #1

## 2019-02-28 ENCOUNTER — Ambulatory Visit: Payer: PPO | Attending: Neurology | Admitting: Physical Therapy

## 2019-02-28 ENCOUNTER — Other Ambulatory Visit: Payer: Self-pay

## 2019-02-28 ENCOUNTER — Encounter: Payer: Self-pay | Admitting: Physical Therapy

## 2019-02-28 DIAGNOSIS — M79605 Pain in left leg: Secondary | ICD-10-CM | POA: Diagnosis not present

## 2019-02-28 DIAGNOSIS — R262 Difficulty in walking, not elsewhere classified: Secondary | ICD-10-CM | POA: Diagnosis not present

## 2019-02-28 DIAGNOSIS — M6281 Muscle weakness (generalized): Secondary | ICD-10-CM | POA: Diagnosis not present

## 2019-02-28 NOTE — Therapy (Signed)
[place], [name], [address] Phone: [phone]   Fax:  [phone]  Physical Therapy Evaluation  Patient Details  Name: William [name] MRN: [id number] Date of Birth: Jul 19, 1940 Referring Provider (PT): Jaffey   Encounter Date: 02/28/2019  PT End of Session - 02/28/19 1433    Visit Number  1    Date for PT Re-Evaluation  04/30/19    PT Start Time  4315    PT Stop Time  1435    PT Time Calculation (min)  41 min    Activity Tolerance  Patient tolerated treatment well;Patient limited by lethargy    Behavior During Therapy  [facility] for tasks assessed/performed;Anxious       Past Medical History:  Diagnosis Date  . ANXIETY   . Arthritis    low back  . Bladder stone   . BPH (benign prostatic hypertrophy)   . Carotid artery occlusion   . 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 CVD (cerebrovascular disease)   . Eczema   . GERD (gastroesophageal reflux disease)   . History of carotid artery stenosis    S/P BILATERAL CEA  . 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 Hyperlipidemia   . Hypertension   . Nocturia   . S/P carotid endarterectomy    BILATERAL ICA--  PATENT PER DUPLEX  05-19-2012  . Squamous cell carcinoma    right calf  . Stroke [facility]) 1996   pt states no weaknes but has severe anxiety  . Urinary frequency   . Vitamin D deficiency     Past Surgical History:  Procedure Laterality Date  . APPENDECTOMY  AS CHILD  . CARDIOVASCULAR STRESS TEST  03-27-2012  DR CRENSHAW   LOW RISK LEXISCAN STUDY-- PROBABLE NORMAL PERFUSION AND SOFT TISSUE ATTENUATION/  NO ISCHEMIA/ EF 51%  . CAROTID ENDARTERECTOMY Bilateral LEFT  11-12-2008  DR GREG HAYES   RIGHT ICA  2006  ([facility])  . CYSTOSCOPY WITH LITHOLAPAXY N/A 02/26/2013   Procedure: CYSTOSCOPY WITH LITHOLAPAXY;  Surgeon: Franchot Gallo, MD;  Location: [facility];  Service: Urology;   Laterality: N/A;  . EYE SURGERY  Jan. 2016   cataract surgery both eyes  . INGUINAL HERNIA REPAIR Right 11-08-2006  . MASS EXCISION N/A 03/03/2016   Procedure: EXCISION OF BACK  MASS;  Surgeon: Stark Klein, MD;  Location: [facility];  Service: General;  Laterality: N/A;  . 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 PROSTATE SURGERY    . TRANSURETHRAL RESECTION OF PROSTATE N/A 02/26/2013   Procedure: TRANSURETHRAL RESECTION OF THE PROSTATE WITH GYRUS INSTRUMENTS;  Surgeon: Franchot Gallo, MD;  Location[facility];  Service: Urology;  Laterality: N/A;    There were no vitals filed for this visit.   Subjective Assessment - 02/28/19 1356    Subjective  Patient reports that over the past few months, reports that leg pain wakes him up, has not been able to walk the dog, REports that he feels weak.  He does report a fall.    Pertinent History  CVA, depression, anxiety    How long can you walk comfortably?  200 feet max    Patient Stated Goals  be stronger, walk around the block with his wife    Currently in Pain?  Yes    Pain Score  8  Pain Location  Leg    Pain Orientation  Right;Left;Lower    Pain Descriptors / Indicators  Aching    Pain Type  Acute pain    Pain Onset  More than a month ago    Pain Frequency  Intermittent    Aggravating Factors   walking and standing pain up to 8/10    Pain Relieving Factors  sit and rest pain can be 0/10    Effect of Pain on Daily Activities  limits walking         [facility] PT Assessment - 02/28/19 0001      Assessment   Medical Diagnosis  Leg weakness, leg pain , difficulty walking    Referring Provider (PT)  Jaffey    Onset Date/Surgical Date  01/28/19    Prior Therapy  3 years ago      Precautions   Precautions  None      Balance Screen   Has the patient fallen in the past 6 months  Yes    How many times?  1    Has the patient had a decrease in activity level because of a fear of falling?    No    Is the patient reluctant to leave their home because of a fear of falling?   No      Home Environment   Additional Comments  no stairs      Prior Function   Level of Independence  Independent    Vocation  Retired    Leisure  no exercise      Posture/Postural Control   Posture Comments  fwd heade, rounded shoulder      ROM / Strength   AROM / PROM / Strength  AROM;Strength      AROM   Overall AROM Comments  Lumbar ROM 75% limited.      Strength   Overall Strength Comments  Hips 3+/5, Knees 3+/5, ankles 4-/5      Standardized Balance Assessment   Standardized Balance Assessment  Timed Up and Go Test;Berg Balance Test      Berg Balance Test   Sit to Stand  Able to stand without using hands and stabilize independently    Standing Unsupported  Able to stand 2 minutes with supervision    Sitting with Back Unsupported but Feet Supported on Floor or Stool  Able to sit safely and securely 2 minutes    Stand to Sit  Controls descent by using hands    Transfers  Able to transfer safely, definite need of hands    Standing Unsupported with Eyes Closed  Able to stand 3 seconds    Standing Unsupported with Feet Together  Able to place feet together independently and stand for 1 minute with supervision    From Standing, Reach Forward with Outstretched Arm  Can reach forward >12 cm safely (5")    From Standing Position, Pick up Object from Floor  Unable to pick up shoe, but reaches 2-5 cm (1-2") from shoe and balances independently    From Standing Position, Turn to Look Behind Over each Shoulder  Turn sideways only but maintains balance    Turn 360 Degrees  Able to turn 360 degrees safely but slowly    Standing Unsupported, Alternately Place Feet on Step/Stool  Able to complete 4 steps without aid or supervision    Standing Unsupported, One Foot in Front  Able to take small step independently and hold 30 seconds    Standing on One Leg  Tries to lift leg/unable to hold 3 seconds but  remains standing independently    Total Score  36      Timed Up and Go Test   Normal TUG (seconds)  21                Objective measurements completed on examination: See above findings.      [facility] Adult PT Treatment/Exercise - 02/28/19 0001      Exercises   Exercises  Knee/Hip      Knee/Hip Exercises: Aerobic   Nustep  level 4 x 5 minutes               PT Short Term Goals - 02/28/19 1438      PT SHORT TERM GOAL #1   Title  independent with initial HEP    Time  2    Period  Weeks    Status  New        PT Long Term Goals - 02/28/19 1439      PT LONG TERM GOAL #1   Title  Understand fall risks, the importance of exercise and flexibility to health and function    Period  Weeks    Status  New      PT LONG TERM GOAL #2   Title  decrease TUG time to 16 seconds    Time  8    Period  Weeks    Status  New      PT LONG TERM GOAL #3   Title  walk around our building without rest     Time  8    Period  Weeks    Status  New      PT LONG TERM GOAL #4   Title  decrease pain with walking by 50%    Time  8    Period  Weeks    Status  New      PT LONG TERM GOAL #5   Title  increase lumbar ROM 25%    Time  8    Period  Weeks    Status  New             Plan - 02/28/19 1434    Clinical Impression Statement  Patient reports difficulty walking, a fall and leg pain worsening over the psat few months, he hsa a history of a CVA with residual left extremity weakness and decreased coordination.  His Berg score was 36/56 and Tug was 22 seconds both putting him at a high risk for falls, his biggest issue is difficulty walking, reporting he cnnot walk his dog, he feels pain in the legs with walking    Personal Factors and Comorbidities  Behavior Pattern;Comorbidity 3+    Comorbidities  CVA, confusion, anxiety, depression, back pain    Stability/Clinical Decision Making  Evolving/Moderate complexity    Clinical Decision Making  Moderate    Rehab  Potential  Good    PT Frequency  2x / week    PT Duration  8 weeks    PT Treatment/Interventions  ADLs/Self Care Home Management;Gait training;Stair training;Functional mobility training;Neuromuscular re-education;Balance training;Therapeutic exercise;Therapeutic activities;Patient/family education;Manual techniques    PT Next Visit Plan  start exercises for strength, endurance and work on balance, would like machines cleaned before he uses it    Consulted and Agree with Plan of Care  Patient;Family member/caregiver    Family Member Consulted  spouse       Patient will benefit from skilled therapeutic intervention in order to  improve the following deficits and impairments:  Abnormal gait, Decreased cognition, Cardiopulmonary status limiting activity, Decreased activity tolerance, Decreased endurance, Decreased range of motion, Decreased strength, Decreased balance, Difficulty walking, Decreased safety awareness, Improper body mechanics, Decreased mobility  Visit Diagnosis: Difficulty in walking, not elsewhere classified - Plan: PT plan of care cert/re-cert  Muscle weakness (generalized) - Plan: PT plan of care cert/re-cert  Pain in left leg - Plan: PT plan of care cert/re-cert     Problem List Patient Active Problem List   Diagnosis Date Noted  . Lumbar spondylosis 05/02/2018  . Black head 04/28/2018  . Pain in left knee 03/09/2018  . 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 Trochanteric bursitis of left hip 01/16/2018  . 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 Preventative health care 09/26/2017  . HTN (hypertension) 07/19/2015  . Hyperlipidemia 07/19/2015  . Closed fracture of fifth metacarpal bone 05/15/2015  . 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  . Great toe pain 02/11/2014  . Dementia with behavioral disturbance ([place]) 01/09/2014  . History of right MCA stroke 11/09/2013  . Obesity (BMI 30-39.9) 06/25/2013  . Renal insufficiency  06/25/2013  . Weakness of left arm 06/25/2013  . Aftercare following surgery of the circulatory system, Luis Lopez 05/17/2013  . Confusion 04/17/2012  . Sebaceous cyst 03/03/2011  . LUMBAR SPRAIN AND STRAIN 07/31/2010  . URINARY FREQUENCY 09/25/2009  . RIB PAIN, LEFT SIDED 08/29/2009  . DEPRESSION/ANXIETY 08/01/2009  . CAROTID ARTERY STENOSIS, BILATERAL 05/02/2009  . DIZZINESS 05/02/2009  . HEADACHE 05/02/2009  . ECZEMA, ATOPIC 05/31/2008  . UNSPECIFIED VITAMIN D DEFICIENCY 03/01/2008  . FATIGUE 01/26/2008  . BPH (benign prostatic hyperplasia) 08/06/2007  . ANXIETY 12/21/2006  . HOARSENESS 12/21/2006  . FASTING HYPERGLYCEMIA 12/21/2006  . Hyperlipidemia LDL goal <100 09/27/2006  . Unspecified cerebral artery occlusion with cerebral infarction 09/27/2006    Sumner Boast., PT 02/28/2019, 2:41 PM  [place], [name], [address] Phone: [phone]   Fax:  [phone]  Name: DELANE WESSINGER MRN: [id number] Date of Birth: 1941/05/24

## 2019-03-06 ENCOUNTER — Other Ambulatory Visit: Payer: Self-pay

## 2019-03-06 ENCOUNTER — Ambulatory Visit: Payer: PPO | Admitting: Physical Therapy

## 2019-03-06 ENCOUNTER — Encounter: Payer: Self-pay | Admitting: Physical Therapy

## 2019-03-06 DIAGNOSIS — R262 Difficulty in walking, not elsewhere classified: Secondary | ICD-10-CM

## 2019-03-06 DIAGNOSIS — M79605 Pain in left leg: Secondary | ICD-10-CM

## 2019-03-06 DIAGNOSIS — M6281 Muscle weakness (generalized): Secondary | ICD-10-CM

## 2019-03-06 NOTE — Therapy (Signed)
[place] [address] [place], [name], [address] Phone: [phone]   Fax:  [phone]  Physical Therapy Treatment  Patient Details  Name: William [name] MRN: [id number] Date of Birth: 1940/12/10 Referring Provider (PT): Jaffey   Encounter Date: 03/06/2019  PT End of Session - 03/06/19 1432    Visit Number  2    Date for PT Re-Evaluation  04/30/19    PT Start Time  4403    PT Stop Time  1428    PT Time Calculation (min)  43 min    Activity Tolerance  Patient tolerated treatment well;Patient limited by lethargy    Behavior During Therapy  [facility] for tasks assessed/performed;Anxious       Past Medical History:  Diagnosis Date  . ANXIETY   . Arthritis    low back  . Bladder stone   . BPH (benign prostatic hypertrophy)   . Carotid artery occlusion   . 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 CVD (cerebrovascular disease)   . Eczema   . GERD (gastroesophageal reflux disease)   . History of carotid artery stenosis    S/P BILATERAL CEA  . 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 Hyperlipidemia   . Hypertension   . Nocturia   . S/P carotid endarterectomy    BILATERAL ICA--  PATENT PER DUPLEX  05-19-2012  . Squamous cell carcinoma    right calf  . Stroke [facility]) 1996   pt states no weaknes but has severe anxiety  . Urinary frequency   . Vitamin D deficiency     Past Surgical History:  Procedure Laterality Date  . APPENDECTOMY  AS CHILD  . CARDIOVASCULAR STRESS TEST  03-27-2012  DR CRENSHAW   LOW RISK LEXISCAN STUDY-- PROBABLE NORMAL PERFUSION AND SOFT TISSUE ATTENUATION/  NO ISCHEMIA/ EF 51%  . CAROTID ENDARTERECTOMY Bilateral LEFT  11-12-2008  DR GREG HAYES   RIGHT ICA  2006  ([facility])  . CYSTOSCOPY WITH LITHOLAPAXY N/A 02/26/2013   Procedure: CYSTOSCOPY WITH LITHOLAPAXY;  Surgeon: Franchot Gallo, MD;  Location: [facility];  Service: Urology;   Laterality: N/A;  . EYE SURGERY  Jan. 2016   cataract surgery both eyes  . INGUINAL HERNIA REPAIR Right 11-08-2006  . MASS EXCISION N/A 03/03/2016   Procedure: EXCISION OF BACK  MASS;  Surgeon: Stark Klein, MD;  Location: [place];  Service: General;  Laterality: N/A;  . 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 PROSTATE SURGERY    . TRANSURETHRAL RESECTION OF PROSTATE N/A 02/26/2013   Procedure: TRANSURETHRAL RESECTION OF THE PROSTATE WITH GYRUS INSTRUMENTS;  Surgeon: Franchot Gallo, MD;  [facility];  Service: Urology;  Laterality: N/A;    There were no vitals filed for this visit.  Subjective Assessment - 03/06/19 1350    Subjective  "I feel great"    Patient Stated Goals  be stronger, walk around the block with his wife    Currently in Pain?  No/denies    Pain Score  0-No pain                       [facility] Adult PT Treatment/Exercise - 03/06/19 0001      Knee/Hip Exercises: Aerobic   Nustep  level 4 x 6 minutes      Knee/Hip Exercises: Machines for Strengthening  Cybex Knee Extension  5lb 2x10     Cybex Knee Flexion  20lb 2x10    Cybex Leg Press  20lb 2x10      Knee/Hip Exercises: Seated   Long Arc Quad  Both;3 sets;5 reps;Weights    Long Arc Quad Weight  2 lbs.    Ball Squeeze  2x10    Marching  Both;3 sets;5 reps    [employer]  2 lbs.    Hamstring Curl  Both;3 sets;Strengthening;10 reps    Hamstring Limitations  red tband    Sit to Sand  2 sets;10 reps;without UE support;5 reps   round ariex in blue chair               PT Short Term Goals - 02/28/19 1438      PT SHORT TERM GOAL #1   Title  independent with initial HEP    Time  2    Period  Weeks    Status  New        PT Long Term Goals - 02/28/19 1439      PT LONG TERM GOAL #1   Title  Understand fall risks, the importance of exercise and flexibility to health and function    Period  Weeks    Status  New      PT LONG  TERM GOAL #2   Title  decrease TUG time to 16 seconds    Time  8    Period  Weeks    Status  New      PT LONG TERM GOAL #3   Title  walk around our building without rest     Time  8    Period  Weeks    Status  New      PT LONG TERM GOAL #4   Title  decrease pain with walking by 50%    Time  8    Period  Weeks    Status  New      PT LONG TERM GOAL #5   Title  increase lumbar ROM 25%    Time  8    Period  Weeks    Status  New            Plan - 03/06/19 1432    Clinical Impression Statement  Pt tolerated an initial progression to TE well. He did require some assist getting on and off the leg press. Cue needed with machine level intervention to go through the full ROM, especially with seated leg extensions. Visible quad shaking and fatigue noted with seated LAQ and marches. Seated marches very taxing on pt.    Comorbidities  CVA, confusion, anxiety, depression, back pain    Stability/Clinical Decision Making  Evolving/Moderate complexity    Rehab Potential  Good    PT Frequency  2x / week    PT Duration  8 weeks    PT Treatment/Interventions  ADLs/Self Care Home Management;Gait training;Stair training;Functional mobility training;Neuromuscular re-education;Balance training;Therapeutic exercise;Therapeutic activities;Patient/family education;Manual techniques    PT Next Visit Plan  exercises for strength, endurance and work on balance, would like machines cleaned before he uses it       Patient will benefit from skilled therapeutic intervention in order to improve the following deficits and impairments:  Abnormal gait, Decreased cognition, Cardiopulmonary status limiting activity, Decreased activity tolerance, Decreased endurance, Decreased range of motion, Decreased strength, Decreased balance, Difficulty walking, Decreased safety awareness, Improper body mechanics, Decreased mobility  Visit Diagnosis: No diagnosis found.  Problem List Patient Active Problem List    Diagnosis Date Noted  . Lumbar spondylosis 05/02/2018  . Black head 04/28/2018  . Pain in left knee 03/09/2018  . 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 Trochanteric bursitis of left hip 01/16/2018  . 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 Preventative health care 09/26/2017  . HTN (hypertension) 07/19/2015  . Hyperlipidemia 07/19/2015  . Closed fracture of fifth metacarpal bone 05/15/2015  . 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  . Great toe pain 02/11/2014  . Dementia with behavioral disturbance ([place]) 01/09/2014  . History of right MCA stroke 11/09/2013  . Obesity (BMI 30-39.9) 06/25/2013  . Renal insufficiency 06/25/2013  . Weakness of left arm 06/25/2013  . Aftercare following surgery of the circulatory system, [place] 05/17/2013  . Confusion 04/17/2012  . Sebaceous cyst 03/03/2011  . LUMBAR SPRAIN AND STRAIN 07/31/2010  . URINARY FREQUENCY 09/25/2009  . RIB PAIN, LEFT SIDED 08/29/2009  . DEPRESSION/ANXIETY 08/01/2009  . CAROTID ARTERY STENOSIS, BILATERAL 05/02/2009  . DIZZINESS 05/02/2009  . HEADACHE 05/02/2009  . ECZEMA, ATOPIC 05/31/2008  . UNSPECIFIED VITAMIN D DEFICIENCY 03/01/2008  . FATIGUE 01/26/2008  . BPH (benign prostatic hyperplasia) 08/06/2007  . ANXIETY 12/21/2006  . HOARSENESS 12/21/2006  . FASTING HYPERGLYCEMIA 12/21/2006  . Hyperlipidemia LDL goal <100 09/27/2006  . Unspecified cerebral artery occlusion with cerebral infarction 09/27/2006    Scot Jun, PTA 03/06/2019, 2:39 PM  [place] [address] [place], [name], [address] Phone: [phone]   Fax:  [phone]  Name: AARNAV STEAGALL MRN: [id number] Date of Birth: 05-18-1941

## 2019-03-07 ENCOUNTER — Ambulatory Visit: Payer: PPO | Admitting: Physical Therapy

## 2019-03-07 ENCOUNTER — Encounter: Payer: Self-pay | Admitting: Physical Therapy

## 2019-03-07 DIAGNOSIS — R262 Difficulty in walking, not elsewhere classified: Secondary | ICD-10-CM

## 2019-03-07 DIAGNOSIS — M79605 Pain in left leg: Secondary | ICD-10-CM

## 2019-03-07 DIAGNOSIS — M6281 Muscle weakness (generalized): Secondary | ICD-10-CM

## 2019-03-07 NOTE — Therapy (Signed)
[place], [name], [address] Phone: [phone]   Fax:  [phone]  Physical Therapy Treatment  Patient Details  Name: William [name] MRN: [id number] Date of Birth: January 09, 1941 Referring Provider (PT): Jaffey   Encounter Date: 03/07/2019  PT End of Session - 03/07/19 1358    Visit Number  3    Date for PT Re-Evaluation  04/30/19    PT Start Time  6967    PT Stop Time  1358    PT Time Calculation (min)  43 min    Activity Tolerance  Patient tolerated treatment well;Patient limited by lethargy    Behavior During Therapy  [facility] for tasks assessed/performed;Anxious       Past Medical History:  Diagnosis Date  . ANXIETY   . Arthritis    low back  . Bladder stone   . BPH (benign prostatic hypertrophy)   . Carotid artery occlusion   . 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 CVD (cerebrovascular disease)   . Eczema   . GERD (gastroesophageal reflux disease)   . History of carotid artery stenosis    S/P BILATERAL CEA  . 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 Hyperlipidemia   . Hypertension   . Nocturia   . S/P carotid endarterectomy    BILATERAL ICA--  PATENT PER DUPLEX  05-19-2012  . Squamous cell carcinoma    right calf  . Stroke [facility]) 1996   pt states no weaknes but has severe anxiety  . Urinary frequency   . Vitamin D deficiency     Past Surgical History:  Procedure Laterality Date  . APPENDECTOMY  AS CHILD  . CARDIOVASCULAR STRESS TEST  03-27-2012  DR CRENSHAW   LOW RISK LEXISCAN STUDY-- PROBABLE NORMAL PERFUSION AND SOFT TISSUE ATTENUATION/  NO ISCHEMIA/ EF 51%  . CAROTID ENDARTERECTOMY Bilateral LEFT  11-12-2008  DR GREG HAYES   RIGHT ICA  2006  ([facility])  . CYSTOSCOPY WITH LITHOLAPAXY N/A 02/26/2013   Procedure: CYSTOSCOPY WITH LITHOLAPAXY;  Surgeon: Franchot Gallo, MD;  Location: [facility];  Service: Urology;   Laterality: N/A;  . EYE SURGERY  Jan. 2016   cataract surgery both eyes  . INGUINAL HERNIA REPAIR Right 11-08-2006  . MASS EXCISION N/A 03/03/2016   Procedure: EXCISION OF BACK  MASS;  Surgeon: Stark Klein, MD;  Location: [place];  Service: General;  Laterality: N/A;  . 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 PROSTATE SURGERY    . TRANSURETHRAL RESECTION OF PROSTATE N/A 02/26/2013   Procedure: TRANSURETHRAL RESECTION OF THE PROSTATE WITH GYRUS INSTRUMENTS;  Surgeon: Franchot Gallo, MD;  Location: [facility];  Service: Urology;  Laterality: N/A;    There were no vitals filed for this visit.  Subjective Assessment - 03/07/19 1325    Subjective  Reports that he was very tired after the exercises, "had to take a nap"    Currently in Pain?  Yes    Pain Score  2     Pain Location  Leg    Pain Orientation  Left;Right                       [facility] Adult PT Treatment/Exercise - 03/07/19 0001      High Level Balance   High Level Balance Comments  in hall with hockey  stick using a ball to shoot a little for balance had some issues with this, then standing ball toss and catch for balance      Knee/Hip Exercises: Aerobic   Recumbent Bike  4 minutes    Nustep  level 5 x 6 minutes    Other Aerobic  UBE level 3 x 4 minutes      Knee/Hip Exercises: Machines for Strengthening   Cybex Knee Extension  5lb 2x10     Cybex Knee Flexion  20lb 2x10    Other Machine  seated rows 20#, lats 20# 2x10, chest press 10# 2x10               PT Short Term Goals - 03/07/19 1403      PT SHORT TERM GOAL #1   Title  independent with initial HEP    Status  On-going        PT Long Term Goals - 03/07/19 1404      PT LONG TERM GOAL #1   Title  Understand fall risks, the importance of exercise and flexibility to health and function    Status  On-going            Plan - 03/07/19 1358    Clinical Impression Statement  Patient  needs a lot of encouragement to get good motions and use of the left extremities.  HE did have some difficulty with the balance, he reports that he is not steady.    PT Next Visit Plan  Continue to work on strength, function and balance    Consulted and Agree with Plan of Care  Patient;Family member/caregiver       Patient will benefit from skilled therapeutic intervention in order to improve the following deficits and impairments:  Abnormal gait, Decreased cognition, Cardiopulmonary status limiting activity, Decreased activity tolerance, Decreased endurance, Decreased range of motion, Decreased strength, Decreased balance, Difficulty walking, Decreased safety awareness, Improper body mechanics, Decreased mobility  Visit Diagnosis: Muscle weakness (generalized)  Difficulty in walking, not elsewhere classified  Pain in left leg     Problem List Patient Active Problem List   Diagnosis Date Noted  . Lumbar spondylosis 05/02/2018  . Black head 04/28/2018  . Pain in left knee 03/09/2018  . 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 Trochanteric bursitis of left hip 01/16/2018  . 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 Preventative health care 09/26/2017  . HTN (hypertension) 07/19/2015  . Hyperlipidemia 07/19/2015  . Closed fracture of fifth metacarpal bone 05/15/2015  . 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  . Great toe pain 02/11/2014  . Dementia with behavioral disturbance ([place]) 01/09/2014  . History of right MCA stroke 11/09/2013  . Obesity (BMI 30-39.9) 06/25/2013  . Renal insufficiency 06/25/2013  . Weakness of left arm 06/25/2013  . Aftercare following surgery of the circulatory system, [place] 05/17/2013  . Confusion 04/17/2012  . Sebaceous cyst 03/03/2011  . LUMBAR SPRAIN AND STRAIN 07/31/2010  . URINARY FREQUENCY 09/25/2009  . RIB PAIN, LEFT SIDED 08/29/2009  . DEPRESSION/ANXIETY 08/01/2009  . CAROTID ARTERY STENOSIS, BILATERAL  05/02/2009  . DIZZINESS 05/02/2009  . HEADACHE 05/02/2009  . ECZEMA, ATOPIC 05/31/2008  . UNSPECIFIED VITAMIN D DEFICIENCY 03/01/2008  . FATIGUE 01/26/2008  . BPH (benign prostatic hyperplasia) 08/06/2007  . ANXIETY 12/21/2006  . HOARSENESS 12/21/2006  . FASTING HYPERGLYCEMIA 12/21/2006  . Hyperlipidemia LDL goal <100 09/27/2006  . Unspecified cerebral artery occlusion with cerebral infarction 09/27/2006  Sumner Boast., PT 03/07/2019, 2:04 PM  [place], [name], [address] Phone: [phone]   Fax:  [phone]  Name: William [name] MRN: [id number] Date of Birth: July 25, 1940

## 2019-03-08 MED FILL — ESZOPICLONE 3 MG TABS: 3 | 90 days supply | Qty: 90 | Fill #0

## 2019-03-10 ENCOUNTER — Ambulatory Visit
Admission: RE | Admit: 2019-03-10 | Discharge: 2019-03-10 | Disposition: A | Discharge status: Home / Self Care | Payer: PPO | Source: Ambulatory Visit | Attending: Neurology | Admitting: Neurology

## 2019-03-10 ENCOUNTER — Other Ambulatory Visit: Payer: Self-pay

## 2019-03-10 DIAGNOSIS — M79604 Pain in right leg: Secondary | ICD-10-CM

## 2019-03-10 DIAGNOSIS — R292 Abnormal reflex: Secondary | ICD-10-CM

## 2019-03-10 DIAGNOSIS — Z8673 Personal history of transient ischemic attack (TIA), and cerebral infarction without residual deficits: Secondary | ICD-10-CM

## 2019-03-10 DIAGNOSIS — M4802 Spinal stenosis, cervical region: Secondary | ICD-10-CM | POA: Diagnosis not present

## 2019-03-10 DIAGNOSIS — R29898 Other symptoms and signs involving the musculoskeletal system: Secondary | ICD-10-CM

## 2019-03-10 DIAGNOSIS — M79605 Pain in left leg: Secondary | ICD-10-CM

## 2019-03-12 MED FILL — LORazepam 0.5 MG TABS: 0.5 | 30 days supply | Qty: 150 | Fill #0

## 2019-03-13 ENCOUNTER — Encounter: Payer: Self-pay | Admitting: Physical Therapy

## 2019-03-13 ENCOUNTER — Ambulatory Visit: Payer: PPO | Admitting: Physical Therapy

## 2019-03-13 ENCOUNTER — Other Ambulatory Visit: Payer: Self-pay

## 2019-03-13 DIAGNOSIS — R262 Difficulty in walking, not elsewhere classified: Secondary | ICD-10-CM

## 2019-03-13 DIAGNOSIS — M79605 Pain in left leg: Secondary | ICD-10-CM

## 2019-03-13 DIAGNOSIS — M6281 Muscle weakness (generalized): Secondary | ICD-10-CM

## 2019-03-13 NOTE — Therapy (Signed)
[place], [name], [address] Phone: [phone]   Fax:  [phone]  Physical Therapy Treatment  Patient Details  Name: William [name] MRN: [id number] Date of Birth: Jan 27, 1941 Referring Provider (PT): Jaffey   Encounter Date: 03/13/2019  PT End of Session - 03/13/19 1433    Visit Number  4    Date for PT Re-Evaluation  04/30/19    PT Start Time  8315    PT Stop Time  1430    PT Time Calculation (min)  44 min    Activity Tolerance  Patient tolerated treatment well;Patient limited by lethargy    Behavior During Therapy  [facility] for tasks assessed/performed;Anxious       Past Medical History:  Diagnosis Date  . ANXIETY   . Arthritis    low back  . Bladder stone   . BPH (benign prostatic hypertrophy)   . Carotid artery occlusion   . 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 CVD (cerebrovascular disease)   . Eczema   . GERD (gastroesophageal reflux disease)   . History of carotid artery stenosis    S/P BILATERAL CEA  . 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 Hyperlipidemia   . Hypertension   . Nocturia   . S/P carotid endarterectomy    BILATERAL ICA--  PATENT PER DUPLEX  05-19-2012  . Squamous cell carcinoma    right calf  . Stroke [facility]) 1996   pt states no weaknes but has severe anxiety  . Urinary frequency   . Vitamin D deficiency     Past Surgical History:  Procedure Laterality Date  . APPENDECTOMY  AS CHILD  . CARDIOVASCULAR STRESS TEST  03-27-2012  DR CRENSHAW   LOW RISK LEXISCAN STUDY-- PROBABLE NORMAL PERFUSION AND SOFT TISSUE ATTENUATION/  NO ISCHEMIA/ EF 51%  . CAROTID ENDARTERECTOMY Bilateral LEFT  11-12-2008  DR GREG HAYES   RIGHT ICA  2006  ([facility])  . CYSTOSCOPY WITH LITHOLAPAXY N/A 02/26/2013   Procedure: CYSTOSCOPY WITH LITHOLAPAXY;  Surgeon: Franchot Gallo, MD;  Location: [facility];  Service: Urology;   Laterality: N/A;  . EYE SURGERY  Jan. 2016   cataract surgery both eyes  . INGUINAL HERNIA REPAIR Right 11-08-2006  . MASS EXCISION N/A 03/03/2016   Procedure: EXCISION OF BACK  MASS;  Surgeon: Stark Klein, MD;  Location: [place];  Service: General;  Laterality: N/A;  . 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 PROSTATE SURGERY    . TRANSURETHRAL RESECTION OF PROSTATE N/A 02/26/2013   Procedure: TRANSURETHRAL RESECTION OF THE PROSTATE WITH GYRUS INSTRUMENTS;  Surgeon: Franchot Gallo, MD;  Location: [facility];  Service: Urology;  Laterality: N/A;    There were no vitals filed for this visit.  Subjective Assessment - 03/13/19 1353    Subjective  "All right"    Pertinent History  CVA, depression, anxiety    Patient Stated Goals  be stronger, walk around the block with his wife    Currently in Pain?  No/denies                       [facility] Adult PT Treatment/Exercise - 03/13/19 0001      Knee/Hip Exercises: Aerobic   Nustep  level 5 x 6 minutes      Knee/Hip Exercises: Machines for Strengthening  Cybex Knee Extension  5lb 2x10     Cybex Knee Flexion  20lb 2x10    Cybex Leg Press  20lb 2x10      Knee/Hip Exercises: Standing   Other Standing Knee Exercises  blue tband rows 2x10    Other Standing Knee Exercises  Alt 4in box taps 2x10   Some LOB      Knee/Hip Exercises: Seated   Ball Squeeze  2x10    Marching  Both;2 sets;10 reps    Marching Weights  2 lbs.    Sit to Sand  2 sets;15 reps;10 reps;without UE support   lowered UBE seat holding  red and yellow ball               PT Short Term Goals - 03/07/19 1403      PT SHORT TERM GOAL #1   Title  independent with initial HEP    Status  On-going        PT Long Term Goals - 03/07/19 1404      PT LONG TERM GOAL #1   Title  Understand fall risks, the importance of exercise and flexibility to health and function    Status  On-going             Plan - 03/13/19 1434    Clinical Impression Statement  Compensation noted throughout session, pt tends to rely on his RLE with bilateral interventions. No reports of pain. Some instability noted with alt box taps. Cues to go through the full ROM on leg press.    Stability/Clinical Decision Making  Evolving/Moderate complexity    Rehab Potential  Good    PT Frequency  2x / week    PT Duration  8 weeks    PT Treatment/Interventions  ADLs/Self Care Home Management;Gait training;Stair training;Functional mobility training;Neuromuscular re-education;Balance training;Therapeutic exercise;Therapeutic activities;Patient/family education;Manual techniques    PT Next Visit Plan  Continue to work on strength, function and balance       Patient will benefit from skilled therapeutic intervention in order to improve the following deficits and impairments:  Abnormal gait, Decreased cognition, Cardiopulmonary status limiting activity, Decreased activity tolerance, Decreased endurance, Decreased range of motion, Decreased strength, Decreased balance, Difficulty walking, Decreased safety awareness, Improper body mechanics, Decreased mobility  Visit Diagnosis: Difficulty in walking, not elsewhere classified  Pain in left leg  Muscle weakness (generalized)     Problem List Patient Active Problem List   Diagnosis Date Noted  . Lumbar spondylosis 05/02/2018  . Black head 04/28/2018  . Pain in left knee 03/09/2018  . 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 Trochanteric bursitis of left hip 01/16/2018  . 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 Preventative health care 09/26/2017  . HTN (hypertension) 07/19/2015  . Hyperlipidemia 07/19/2015  . Closed fracture of fifth metacarpal bone 05/15/2015  . 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  . Great toe pain 02/11/2014  . Dementia with behavioral disturbance ([place]) 01/09/2014  . History of right MCA  stroke 11/09/2013  . Obesity (BMI 30-39.9) 06/25/2013  . Renal insufficiency 06/25/2013  . Weakness of left arm 06/25/2013  . Aftercare following surgery of the circulatory system, [place] 05/17/2013  . Confusion 04/17/2012  . Sebaceous cyst 03/03/2011  . LUMBAR SPRAIN AND STRAIN 07/31/2010  . URINARY FREQUENCY 09/25/2009  . RIB PAIN, LEFT SIDED 08/29/2009  . DEPRESSION/ANXIETY 08/01/2009  . CAROTID ARTERY STENOSIS, BILATERAL 05/02/2009  . DIZZINESS 05/02/2009  . HEADACHE 05/02/2009  .  ECZEMA, ATOPIC 05/31/2008  . UNSPECIFIED VITAMIN D DEFICIENCY 03/01/2008  . FATIGUE 01/26/2008  . BPH (benign prostatic hyperplasia) 08/06/2007  . ANXIETY 12/21/2006  . HOARSENESS 12/21/2006  . FASTING HYPERGLYCEMIA 12/21/2006  . Hyperlipidemia LDL goal <100 09/27/2006  . Unspecified cerebral artery occlusion with cerebral infarction 09/27/2006    Scot Jun, PTA 03/13/2019, 2:39 PM  [place] [address] [place], [name], [address] Phone: [phone]   Fax:  [phone]  Name: William [name] MRN: [id number] Date of Birth: 08-07-1940

## 2019-03-15 ENCOUNTER — Encounter: Payer: Self-pay | Admitting: Physical Therapy

## 2019-03-15 ENCOUNTER — Ambulatory Visit: Payer: PPO | Attending: Neurology | Admitting: Physical Therapy

## 2019-03-15 ENCOUNTER — Other Ambulatory Visit: Payer: Self-pay

## 2019-03-15 DIAGNOSIS — R262 Difficulty in walking, not elsewhere classified: Secondary | ICD-10-CM | POA: Diagnosis not present

## 2019-03-15 DIAGNOSIS — M79605 Pain in left leg: Secondary | ICD-10-CM | POA: Diagnosis not present

## 2019-03-15 DIAGNOSIS — M6281 Muscle weakness (generalized): Secondary | ICD-10-CM | POA: Diagnosis not present

## 2019-03-15 NOTE — Therapy (Signed)
[name] [place], [name], [address] Phone: [phone]   Fax:  [phone]  Physical Therapy Treatment  Patient Details  Name: William [name] MRN: [id number] Date of Birth: [birth year] Referring Provider (PT): Jaffey   Encounter Date: 03/15/2019  PT End of Session - 03/15/19 1340    Visit Number  5    Date for PT Re-Evaluation  04/30/19    PT Start Time  1300    PT Stop Time  1341    PT Time Calculation (min)  41 min    Activity Tolerance  Patient tolerated treatment well;Patient limited by lethargy    Behavior During Therapy  [facility] for tasks assessed/performed;Anxious       Past Medical History:  Diagnosis Date  . ANXIETY   . Arthritis    low back  . Bladder stone   . BPH (benign prostatic hypertrophy)   . Carotid artery occlusion   . 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 CVD (cerebrovascular disease)   . Eczema   . GERD (gastroesophageal reflux disease)   . History of carotid artery stenosis    S/P BILATERAL CEA  . 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 Hyperlipidemia   . Hypertension   . Nocturia   . S/P carotid endarterectomy    BILATERAL ICA--  PATENT PER DUPLEX  05-19-2012  . Squamous cell carcinoma    right calf  . Stroke [facility]) 1996   pt states no weaknes but has severe anxiety  . Urinary frequency   . Vitamin D deficiency     Past Surgical History:  Procedure Laterality Date  . APPENDECTOMY  AS CHILD  . CARDIOVASCULAR STRESS TEST  03-27-2012  DR CRENSHAW   LOW RISK LEXISCAN STUDY-- PROBABLE NORMAL PERFUSION AND SOFT TISSUE ATTENUATION/  NO ISCHEMIA/ EF 51%  . CAROTID ENDARTERECTOMY Bilateral LEFT  11-12-2008  DR GREG HAYES   RIGHT ICA  2006  ([facility])  . CYSTOSCOPY WITH LITHOLAPAXY N/A 02/26/2013   Procedure: CYSTOSCOPY WITH LITHOLAPAXY;  Surgeon: Franchot Gallo, MD;  Location: [facility];  Service: Urology;   Laterality: N/A;  . EYE SURGERY  Jan. 2016   cataract surgery both eyes  . INGUINAL HERNIA REPAIR Right 11-08-2006  . MASS EXCISION N/A 03/03/2016   Procedure: EXCISION OF BACK  MASS;  Surgeon: Stark Klein, MD;  Location: [place];  Service: General;  Laterality: N/A;  . 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 PROSTATE SURGERY    . TRANSURETHRAL RESECTION OF PROSTATE N/A 02/26/2013   Procedure: TRANSURETHRAL RESECTION OF THE PROSTATE WITH GYRUS INSTRUMENTS;  Surgeon: Franchot Gallo, MD;  Location: [facility];  Service: Urology;  Laterality: N/A;    There were no vitals filed for this visit.  Subjective Assessment - 03/15/19 1303    Subjective  "No good, my legs are tired, I got 5 dogs I have to walk"    Currently in Pain?  No/denies                       [facility] Adult PT Treatment/Exercise - 03/15/19 0001      High Level Balance   High Level Balance Activities  Side stepping;Backward walking      Knee/Hip Exercises: Aerobic   Recumbent Bike  4 minutes    Nustep  level 5 x  6 minutes    Other Aerobic  UBE level 3 x 4 minutes      Knee/Hip Exercises: Machines for Strengthening   Cybex Knee Extension  5lb 2x15    Cybex Knee Flexion  20lb 2x15    Other Machine  seated rows 20#, lats 20# 2x10, chest press 5# 2x10               PT Short Term Goals - 03/07/19 1403      PT SHORT TERM GOAL #1   Title  independent with initial HEP    Status  On-going        PT Long Term Goals - 03/15/19 1341      PT LONG TERM GOAL #2   Title  decrease TUG time to 16 seconds      PT LONG TERM GOAL #3   Title  walk around our building without rest     Status  On-going      PT LONG TERM GOAL #4   Title  decrease pain with walking by 50%    Status  Partially Met            Plan - 03/15/19 1341    Clinical Impression Statement  Frequent rest to pt LE due to subjective reports of fatigue at the beginning of the  treatment session. Some hesitation with balance interventions but he was able to complete without issue. Cues to increase step length with LLE during backwards walking needed. Cues also provided to fully extends LE with seated leg extensions.    Stability/Clinical Decision Making  Evolving/Moderate complexity    Rehab Potential  Good    PT Frequency  2x / week    PT Duration  8 weeks    PT Treatment/Interventions  ADLs/Self Care Home Management;Gait training;Stair training;Functional mobility training;Neuromuscular re-education;Balance training;Therapeutic exercise;Therapeutic activities;Patient/family education;Manual techniques    PT Next Visit Plan  Continue to work on strength, function and balance       Patient will benefit from skilled therapeutic intervention in order to improve the following deficits and impairments:  Abnormal gait, Decreased cognition, Cardiopulmonary status limiting activity, Decreased activity tolerance, Decreased endurance, Decreased range of motion, Decreased strength, Decreased balance, Difficulty walking, Decreased safety awareness, Improper body mechanics, Decreased mobility  Visit Diagnosis: Pain in left leg  Muscle weakness (generalized)  Difficulty in walking, not elsewhere classified     Problem List Patient Active Problem List   Diagnosis Date Noted  . Lumbar spondylosis 05/02/2018  . Black head 04/28/2018  . Pain in left knee 03/09/2018  . 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 Trochanteric bursitis of left hip 01/16/2018  . 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 Preventative health care 09/26/2017  . HTN (hypertension) 07/19/2015  . Hyperlipidemia 07/19/2015  . Closed fracture of fifth metacarpal bone 05/15/2015  . 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  . Great toe pain 02/11/2014  . Dementia with behavioral disturbance ([place]) 01/09/2014  . History of right MCA stroke 11/09/2013  . Obesity (BMI  30-39.9) 06/25/2013  . Renal insufficiency 06/25/2013  . Weakness of left arm 06/25/2013  . Aftercare following surgery of the circulatory system, [place] 05/17/2013  . Confusion 04/17/2012  . Sebaceous cyst 03/03/2011  . LUMBAR SPRAIN AND STRAIN 07/31/2010  . URINARY FREQUENCY 09/25/2009  . RIB PAIN, LEFT SIDED 08/29/2009  . DEPRESSION/ANXIETY 08/01/2009  . CAROTID ARTERY STENOSIS, BILATERAL 05/02/2009  . DIZZINESS 05/02/2009  . HEADACHE 05/02/2009  .  ECZEMA, ATOPIC 05/31/2008  . UNSPECIFIED VITAMIN D DEFICIENCY 03/01/2008  . FATIGUE 01/26/2008  . BPH (benign prostatic hyperplasia) 08/06/2007  . ANXIETY 12/21/2006  . HOARSENESS 12/21/2006  . FASTING HYPERGLYCEMIA 12/21/2006  . Hyperlipidemia LDL goal <100 09/27/2006  . Unspecified cerebral artery occlusion with cerebral infarction 09/27/2006    Scot Jun, PTA 03/15/2019, 1:44 PM  [place], [name], [address] Phone: [phone]   Fax:  [phone]  Name: William [name] MRN: [id number] Date of Birth: 10-19-1940

## 2019-03-19 MED FILL — LISINOPRIL-HCTZ 10-12.5 MG: 10-12.5 | 30 days supply | Qty: 30 | Fill #5

## 2019-03-19 MED FILL — FENOFIBRATE 160 MG TABLET: 160 | 90 days supply | Qty: 90 | Fill #1

## 2019-03-20 ENCOUNTER — Other Ambulatory Visit: Payer: Self-pay

## 2019-03-20 ENCOUNTER — Encounter: Payer: Self-pay | Admitting: Physical Therapy

## 2019-03-20 ENCOUNTER — Ambulatory Visit: Payer: PPO | Admitting: Physical Therapy

## 2019-03-20 DIAGNOSIS — M79605 Pain in left leg: Secondary | ICD-10-CM | POA: Diagnosis not present

## 2019-03-20 DIAGNOSIS — M6281 Muscle weakness (generalized): Secondary | ICD-10-CM

## 2019-03-20 DIAGNOSIS — R262 Difficulty in walking, not elsewhere classified: Secondary | ICD-10-CM

## 2019-03-20 NOTE — Therapy (Signed)
[place], [name], [address] Phone: [phone]   Fax:  [phone]  Physical Therapy Treatment  Patient Details  Name: William [name] MRN: [id number] Date of Birth: 12-Jul-1940 Referring Provider (PT): Jaffey   Encounter Date: 03/20/2019  PT End of Session - 03/20/19 1428    Visit Number  6    Date for PT Re-Evaluation  04/30/19    PT Start Time  0626    PT Stop Time  1430    PT Time Calculation (min)  36 min       Past Medical History:  Diagnosis Date  . ANXIETY   . Arthritis    low back  . Bladder stone   . BPH (benign prostatic hypertrophy)   . Carotid artery occlusion   . 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 CVD (cerebrovascular disease)   . Eczema   . GERD (gastroesophageal reflux disease)   . History of carotid artery stenosis    S/P BILATERAL CEA  . 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 Hyperlipidemia   . Hypertension   . Nocturia   . S/P carotid endarterectomy    BILATERAL ICA--  PATENT PER DUPLEX  05-19-2012  . Squamous cell carcinoma    right calf  . Stroke [facility]) 1996   pt states no weaknes but has severe anxiety  . Urinary frequency   . Vitamin D deficiency     Past Surgical History:  Procedure Laterality Date  . APPENDECTOMY  AS CHILD  . CARDIOVASCULAR STRESS TEST  03-27-2012  DR CRENSHAW   LOW RISK LEXISCAN STUDY-- PROBABLE NORMAL PERFUSION AND SOFT TISSUE ATTENUATION/  NO ISCHEMIA/ EF 51%  . CAROTID ENDARTERECTOMY Bilateral LEFT  11-12-2008  DR GREG HAYES   RIGHT ICA  2006  ([facility])  . CYSTOSCOPY WITH LITHOLAPAXY N/A 02/26/2013   Procedure: CYSTOSCOPY WITH LITHOLAPAXY;  Surgeon: Franchot Gallo, MD;  Location: [facility];  Service: Urology;  Laterality: N/A;  . EYE SURGERY  Jan. 2016   cataract surgery both eyes  . INGUINAL HERNIA REPAIR Right 11-08-2006  . MASS EXCISION N/A 03/03/2016   Procedure: EXCISION OF BACK  MASS;  Surgeon: Stark Klein, MD;  Location: [place];  Service: General;  Laterality: N/A;  . 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 PROSTATE SURGERY    . TRANSURETHRAL RESECTION OF PROSTATE N/A 02/26/2013   Procedure: TRANSURETHRAL RESECTION OF THE PROSTATE WITH GYRUS INSTRUMENTS;  Surgeon: Franchot Gallo, MD;  Location: [facility];  Service: Urology;  Laterality: N/A;    There were no vitals filed for this visit.  Subjective Assessment - 03/20/19 1357    Subjective  "just tired, busy morning"    Currently in Pain?  No/denies         [facility] PT Assessment - 03/20/19 0001      Timed Up and Go Test   Normal TUG (seconds)  13.7                   [facility] Adult PT Treatment/Exercise - 03/20/19 0001      Knee/Hip Exercises: Aerobic   Nustep  level 5 x 6 minutes      Knee/Hip Exercises: Machines for Strengthening   Cybex Leg Press  30lb 2x10      Knee/Hip Exercises: Seated   Long [employer]  Both;Weights;2 sets;10 reps    Long Arc Quad Weight  3 lbs.    Marching  Both;2 sets;10 reps    Marching Weights  3 lbs.    Hamstring Curl  Both;Strengthening;10 reps;2 sets    Hamstring Limitations  green               PT Short Term Goals - 03/07/19 1403      PT SHORT TERM GOAL #1   Title  independent with initial HEP    Status  On-going        PT Long Term Goals - 03/20/19 1411      PT LONG TERM GOAL #1   Title  Understand fall risks, the importance of exercise and flexibility to health and function    Status  Achieved      PT LONG TERM GOAL #2   Title  decrease TUG time to 16 seconds    Baseline  13.7    Status  Achieved      PT LONG TERM GOAL #4   Title  decrease pain with walking by 50%    Status  Partially Met            Plan - 03/20/19 1428    Clinical Impression Statement  Pt ~ 9 minutes late today. He is progressing towards all goals. Pt has decreased his TUG  score meeting goal. Increase weight and or resistance with all LE strengthening interventions.    Comorbidities  CVA, confusion, anxiety, depression, back pain    Stability/Clinical Decision Making  Evolving/Moderate complexity    Rehab Potential  Good    PT Frequency  2x / week    PT Duration  8 weeks    PT Treatment/Interventions  ADLs/Self Care Home Management;Gait training;Stair training;Functional mobility training;Neuromuscular re-education;Balance training;Therapeutic exercise;Therapeutic activities;Patient/family education;Manual techniques    PT Next Visit Plan  Continue to work on strength, function and balance       Patient will benefit from skilled therapeutic intervention in order to improve the following deficits and impairments:  Abnormal gait, Decreased cognition, Cardiopulmonary status limiting activity, Decreased activity tolerance, Decreased endurance, Decreased range of motion, Decreased strength, Decreased balance, Difficulty walking, Decreased safety awareness, Improper body mechanics, Decreased mobility  Visit Diagnosis: Muscle weakness (generalized)  Difficulty in walking, not elsewhere classified     Problem List Patient Active Problem List   Diagnosis Date Noted  . Lumbar spondylosis 05/02/2018  . Black head 04/28/2018  . Pain in left knee 03/09/2018  . 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 Trochanteric bursitis of left hip 01/16/2018  . 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 Preventative health care 09/26/2017  . HTN (hypertension) 07/19/2015  . Hyperlipidemia 07/19/2015  . Closed fracture of fifth metacarpal bone 05/15/2015  . 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  . Great toe pain 02/11/2014  . Dementia with behavioral disturbance ([place]) 01/09/2014  . History of right MCA stroke 11/09/2013  . Obesity (BMI 30-39.9) 06/25/2013  . Renal insufficiency 06/25/2013  . Weakness of left arm 06/25/2013  .  Aftercare following surgery of the circulatory system, [place] 05/17/2013  . Confusion 04/17/2012  . Sebaceous cyst 03/03/2011  . LUMBAR SPRAIN AND STRAIN 07/31/2010  . URINARY FREQUENCY 09/25/2009  . RIB PAIN, LEFT SIDED 08/29/2009  . DEPRESSION/ANXIETY 08/01/2009  . CAROTID ARTERY STENOSIS, BILATERAL 05/02/2009  . DIZZINESS 05/02/2009  . HEADACHE 05/02/2009  . ECZEMA, ATOPIC 05/31/2008  . UNSPECIFIED VITAMIN D DEFICIENCY  03/01/2008  . FATIGUE 01/26/2008  . BPH (benign prostatic hyperplasia) 08/06/2007  . ANXIETY 12/21/2006  . HOARSENESS 12/21/2006  . FASTING HYPERGLYCEMIA 12/21/2006  . Hyperlipidemia LDL goal <100 09/27/2006  . Unspecified cerebral artery occlusion with cerebral infarction 09/27/2006    Scot Jun, PTA 03/20/2019, 2:30 PM  [place], [name], [address] Phone: [phone]   Fax:  [phone]  Name: William [name] MRN: [id number] Date of Birth: 10/08/1940

## 2019-03-22 ENCOUNTER — Other Ambulatory Visit: Payer: Self-pay

## 2019-03-22 ENCOUNTER — Encounter: Payer: Self-pay | Admitting: Physical Therapy

## 2019-03-22 ENCOUNTER — Ambulatory Visit: Payer: PPO | Admitting: Physical Therapy

## 2019-03-22 DIAGNOSIS — R262 Difficulty in walking, not elsewhere classified: Secondary | ICD-10-CM

## 2019-03-22 DIAGNOSIS — M79605 Pain in left leg: Secondary | ICD-10-CM | POA: Diagnosis not present

## 2019-03-22 DIAGNOSIS — M6281 Muscle weakness (generalized): Secondary | ICD-10-CM

## 2019-03-22 NOTE — Therapy (Signed)
[place], [name], [address] Phone: [phone]   Fax:  [phone]  Physical Therapy Treatment  Patient Details  Name: William [name] MRN: [id number] Date of Birth: 04/18/1941 Referring Provider (PT): Jaffey   Encounter Date: 03/22/2019  PT End of Session - 03/22/19 1357    Visit Number  7    Date for PT Re-Evaluation  04/30/19    PT Start Time  7564    PT Stop Time  1357    PT Time Calculation (min)  44 min    Activity Tolerance  Patient tolerated treatment well;Patient limited by lethargy    Behavior During Therapy  [facility] for tasks assessed/performed;Anxious       Past Medical History:  Diagnosis Date  . ANXIETY   . Arthritis    low back  . Bladder stone   . BPH (benign prostatic hypertrophy)   . Carotid artery occlusion   . 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 CVD (cerebrovascular disease)   . Eczema   . GERD (gastroesophageal reflux disease)   . History of carotid artery stenosis    S/P BILATERAL CEA  . 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 Hyperlipidemia   . Hypertension   . Nocturia   . S/P carotid endarterectomy    BILATERAL ICA--  PATENT PER DUPLEX  05-19-2012  . Squamous cell carcinoma    right calf  . Stroke [facility]) 1996   pt states no weaknes but has severe anxiety  . Urinary frequency   . Vitamin D deficiency     Past Surgical History:  Procedure Laterality Date  . APPENDECTOMY  AS CHILD  . CARDIOVASCULAR STRESS TEST  03-27-2012  DR CRENSHAW   LOW RISK LEXISCAN STUDY-- PROBABLE NORMAL PERFUSION AND SOFT TISSUE ATTENUATION/  NO ISCHEMIA/ EF 51%  . CAROTID ENDARTERECTOMY Bilateral LEFT  11-12-2008  DR GREG HAYES   RIGHT ICA  2006  ([facility])  . CYSTOSCOPY WITH LITHOLAPAXY N/A 02/26/2013   Procedure: CYSTOSCOPY WITH LITHOLAPAXY;  Surgeon: Franchot Gallo, MD;  Location: [facility];  Service: Urology;   Laterality: N/A;  . EYE SURGERY  Jan. 2016   cataract surgery both eyes  . INGUINAL HERNIA REPAIR Right 11-08-2006  . MASS EXCISION N/A 03/03/2016   Procedure: EXCISION OF BACK  MASS;  Surgeon: Stark Klein, MD;  Location: [place];  Service: General;  Laterality: N/A;  . 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 PROSTATE SURGERY    . TRANSURETHRAL RESECTION OF PROSTATE N/A 02/26/2013   Procedure: TRANSURETHRAL RESECTION OF THE PROSTATE WITH GYRUS INSTRUMENTS;  Surgeon: Franchot Gallo, MD;  Location: [facility];  Service: Urology;  Laterality: N/A;    There were no vitals filed for this visit.  Subjective Assessment - 03/22/19 1321    Subjective  Tired, when I walk my back hurts    Currently in Pain?  Yes    Pain Score  2     Pain Location  Back    Aggravating Factors   walking                       [facility] Adult PT Treatment/Exercise - 03/22/19 0001      Ambulation/Gait   Gait Comments  no device, gait around 1/2 the front parking island, slow, negotiated curbs up and  dwon      High Level Balance   High Level Balance Comments  ball toss and ball kicks      Knee/Hip Exercises: Aerobic   Nustep  level 5 x 6 minutes    Other Aerobic  UBE level 3 x 6 minutes      Knee/Hip Exercises: Machines for Strengthening   Cybex Knee Extension  5lb 2x15    Cybex Knee Flexion  25lb 2x15    Cybex Leg Press  20# 2x10, then no wieght x 10 each leg    Other Machine  seated rows 20#, lats 20# 2x10, chest press 5# 2x10               PT Short Term Goals - 03/07/19 1403      PT SHORT TERM GOAL #1   Title  independent with initial HEP    Status  On-going        PT Long Term Goals - 03/22/19 1402      PT LONG TERM GOAL #1   Title  Understand fall risks, the importance of exercise and flexibility to health and function      PT LONG TERM GOAL #3   Title  walk around our building without rest     Status  On-going             Plan - 03/22/19 1358    Clinical Impression Statement  I tried to get him to walk around the building or the front parking island he refused saying it would hurt his back, reports that walking causes back pain, once he gets going he does well just needs encouragement, tends to not use the left arm and or leg at times.    PT Next Visit Plan  Continue to work on strength, function and balance    Consulted and Agree with Plan of Care  Patient       Patient will benefit from skilled therapeutic intervention in order to improve the following deficits and impairments:  Abnormal gait, Decreased cognition, Cardiopulmonary status limiting activity, Decreased activity tolerance, Decreased endurance, Decreased range of motion, Decreased strength, Decreased balance, Difficulty walking, Decreased safety awareness, Improper body mechanics, Decreased mobility  Visit Diagnosis: Muscle weakness (generalized)  Difficulty in walking, not elsewhere classified  Pain in left leg     Problem List Patient Active Problem List   Diagnosis Date Noted  . Lumbar spondylosis 05/02/2018  . Black head 04/28/2018  . Pain in left knee 03/09/2018  . 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 Trochanteric bursitis of left hip 01/16/2018  . 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 Preventative health care 09/26/2017  . HTN (hypertension) 07/19/2015  . Hyperlipidemia 07/19/2015  . Closed fracture of fifth metacarpal bone 05/15/2015  . 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  . Great toe pain 02/11/2014  . Dementia with behavioral disturbance ([place]) 01/09/2014  . History of right MCA stroke 11/09/2013  . Obesity (BMI 30-39.9) 06/25/2013  . Renal insufficiency 06/25/2013  . Weakness of left arm 06/25/2013  . Aftercare following surgery of the circulatory system, [place] 05/17/2013  . Confusion 04/17/2012  . Sebaceous cyst 03/03/2011  . LUMBAR SPRAIN AND STRAIN  07/31/2010  . URINARY FREQUENCY 09/25/2009  . RIB PAIN, LEFT SIDED 08/29/2009  . DEPRESSION/ANXIETY 08/01/2009  . CAROTID ARTERY STENOSIS, BILATERAL 05/02/2009  . DIZZINESS 05/02/2009  . HEADACHE 05/02/2009  . ECZEMA, ATOPIC 05/31/2008  . UNSPECIFIED VITAMIN D DEFICIENCY  03/01/2008  . FATIGUE 01/26/2008  . BPH (benign prostatic hyperplasia) 08/06/2007  . ANXIETY 12/21/2006  . HOARSENESS 12/21/2006  . FASTING HYPERGLYCEMIA 12/21/2006  . Hyperlipidemia LDL goal <100 09/27/2006  . Unspecified cerebral artery occlusion with cerebral infarction 09/27/2006    Sumner Boast., PT 03/22/2019, 2:02 PM  [place], [name], [address] Phone: [phone]   Fax:  [phone]  Name: William [name] MRN: [id number] Date of Birth: 06-17-1940

## 2019-03-27 ENCOUNTER — Ambulatory Visit: Payer: PPO | Admitting: Physical Therapy

## 2019-03-27 ENCOUNTER — Other Ambulatory Visit: Payer: Self-pay

## 2019-03-27 ENCOUNTER — Encounter: Payer: Self-pay | Admitting: Physical Therapy

## 2019-03-27 DIAGNOSIS — R262 Difficulty in walking, not elsewhere classified: Secondary | ICD-10-CM

## 2019-03-27 DIAGNOSIS — M6281 Muscle weakness (generalized): Secondary | ICD-10-CM

## 2019-03-27 DIAGNOSIS — M79605 Pain in left leg: Secondary | ICD-10-CM

## 2019-03-27 NOTE — Therapy (Signed)
[place], [name], [address] Phone: [phone]   Fax:  [phone]  Physical Therapy Treatment  Patient Details  Name: William [name] MRN: [id number] Date of Birth: June 07, 1941 Referring Provider (PT): Jaffey   Encounter Date: 03/27/2019  PT End of Session - 03/27/19 1427    Visit Number  8    Date for PT Re-Evaluation  04/30/19    PT Start Time  7341    PT Stop Time  1428    PT Time Calculation (min)  43 min    Activity Tolerance  Patient tolerated treatment well    Behavior During Therapy  [facility] for tasks assessed/performed;Anxious       Past Medical History:  Diagnosis Date  . ANXIETY   . Arthritis    low back  . Bladder stone   . BPH (benign prostatic hypertrophy)   . Carotid artery occlusion   . 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 CVD (cerebrovascular disease)   . Eczema   . GERD (gastroesophageal reflux disease)   . History of carotid artery stenosis    S/P BILATERAL CEA  . 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 Hyperlipidemia   . Hypertension   . Nocturia   . S/P carotid endarterectomy    BILATERAL ICA--  PATENT PER DUPLEX  05-19-2012  . Squamous cell carcinoma    right calf  . Stroke [facility]) 1996   pt states no weaknes but has severe anxiety  . Urinary frequency   . Vitamin D deficiency     Past Surgical History:  Procedure Laterality Date  . APPENDECTOMY  AS CHILD  . CARDIOVASCULAR STRESS TEST  03-27-2012  DR CRENSHAW   LOW RISK LEXISCAN STUDY-- PROBABLE NORMAL PERFUSION AND SOFT TISSUE ATTENUATION/  NO ISCHEMIA/ EF 51%  . CAROTID ENDARTERECTOMY Bilateral LEFT  11-12-2008  DR GREG HAYES   RIGHT ICA  2006  ([facility])  . CYSTOSCOPY WITH LITHOLAPAXY N/A 02/26/2013   Procedure: CYSTOSCOPY WITH LITHOLAPAXY;  Surgeon: Franchot Gallo, MD;  Location: [facility];  Service: Urology;  Laterality: N/A;  . EYE  SURGERY  Jan. 2016   cataract surgery both eyes  . INGUINAL HERNIA REPAIR Right 11-08-2006  . MASS EXCISION N/A 03/03/2016   Procedure: EXCISION OF BACK  MASS;  Surgeon: Stark Klein, MD;  Location: [facility];  Service: General;  Laterality: N/A;  . 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 PROSTATE SURGERY    . TRANSURETHRAL RESECTION OF PROSTATE N/A 02/26/2013   Procedure: TRANSURETHRAL RESECTION OF THE PROSTATE WITH GYRUS INSTRUMENTS;  Surgeon: Franchot Gallo, MD;  Location: [facility];  Service: Urology;  Laterality: N/A;    There were no vitals filed for this visit.  Subjective Assessment - 03/27/19 1348    Subjective  "i feel great"    Currently in Pain?  No/denies                       [facility] Adult PT Treatment/Exercise - 03/27/19 0001      Knee/Hip Exercises: Aerobic   Nustep  level 5 x 6 minutes    Other Aerobic  UBE level 3 x 6 minutes      Knee/Hip Exercises: Machines for Strengthening   Cybex Knee Extension  5lb 2x15    Cybex Knee Flexion  25lb  2x15    Cybex Leg Press  30# 2x10, then no wieght x 10 each leg    Other Machine  seated rows 20#, lats 20# 2x10, chest press 5# 2x10      Knee/Hip Exercises: Standing   Other Standing Knee Exercises  green  tband rows 2x10    Other Standing Knee Exercises  Alt 4in box taps 2x10               PT Short Term Goals - 03/07/19 1403      PT SHORT TERM GOAL #1   Title  independent with initial HEP    Status  On-going        PT Long Term Goals - 03/22/19 1402      PT LONG TERM GOAL #1   Title  Understand fall risks, the importance of exercise and flexibility to health and function      PT LONG TERM GOAL #3   Title  walk around our building without rest     Status  On-going            Plan - 03/27/19 1428    Clinical Impression Statement  Some instability noted with alt box taps, pt also voiced some nervousness. Did well overall with the machine  level exercises. HE does require cues to do all of the exercises through the full available ROM.    Personal Factors and Comorbidities  Behavior Pattern;Comorbidity 3+    Comorbidities  CVA, confusion, anxiety, depression, back pain    Stability/Clinical Decision Making  Evolving/Moderate complexity    Rehab Potential  Good    PT Frequency  2x / week    PT Treatment/Interventions  ADLs/Self Care Home Management;Gait training;Stair training;Functional mobility training;Neuromuscular re-education;Balance training;Therapeutic exercise;Therapeutic activities;Patient/family education;Manual techniques    PT Next Visit Plan  Continue to work on strength, function and balance, outdoor ambulation       Patient will benefit from skilled therapeutic intervention in order to improve the following deficits and impairments:  Abnormal gait, Decreased cognition, Cardiopulmonary status limiting activity, Decreased activity tolerance, Decreased endurance, Decreased range of motion, Decreased strength, Decreased balance, Difficulty walking, Decreased safety awareness, Improper body mechanics, Decreased mobility  Visit Diagnosis: Pain in left leg  Muscle weakness (generalized)  Difficulty in walking, not elsewhere classified     Problem List Patient Active Problem List   Diagnosis Date Noted  . Lumbar spondylosis 05/02/2018  . Black head 04/28/2018  . Pain in left knee 03/09/2018  . 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 Trochanteric bursitis of left hip 01/16/2018  . 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 Preventative health care 09/26/2017  . HTN (hypertension) 07/19/2015  . Hyperlipidemia 07/19/2015  . Closed fracture of fifth metacarpal bone 05/15/2015  . 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  . Great toe pain 02/11/2014  . Dementia with behavioral disturbance ([place]) 01/09/2014  . History of right MCA stroke 11/09/2013  . Obesity (BMI 30-39.9)  06/25/2013  . Renal insufficiency 06/25/2013  . Weakness of left arm 06/25/2013  . Aftercare following surgery of the circulatory system, [place] 05/17/2013  . Confusion 04/17/2012  . Sebaceous cyst 03/03/2011  . LUMBAR SPRAIN AND STRAIN 07/31/2010  . URINARY FREQUENCY 09/25/2009  . RIB PAIN, LEFT SIDED 08/29/2009  . DEPRESSION/ANXIETY 08/01/2009  . CAROTID ARTERY STENOSIS, BILATERAL 05/02/2009  . DIZZINESS 05/02/2009  . HEADACHE 05/02/2009  . ECZEMA, ATOPIC 05/31/2008  . UNSPECIFIED VITAMIN D DEFICIENCY 03/01/2008  .  FATIGUE 01/26/2008  . BPH (benign prostatic hyperplasia) 08/06/2007  . ANXIETY 12/21/2006  . HOARSENESS 12/21/2006  . FASTING HYPERGLYCEMIA 12/21/2006  . Hyperlipidemia LDL goal <100 09/27/2006  . Unspecified cerebral artery occlusion with cerebral infarction 09/27/2006    Scot Jun, PTA 03/27/2019, 2:30 PM  [place], [name], [address] Phone: [phone]   Fax:  [phone]  Name: William [name] MRN: [id number] Date of Birth: October 12, 1940

## 2019-03-29 ENCOUNTER — Other Ambulatory Visit: Payer: Self-pay

## 2019-03-29 ENCOUNTER — Ambulatory Visit: Payer: PPO | Admitting: Physical Therapy

## 2019-03-29 ENCOUNTER — Encounter: Payer: Self-pay | Admitting: Physical Therapy

## 2019-03-29 DIAGNOSIS — R262 Difficulty in walking, not elsewhere classified: Secondary | ICD-10-CM

## 2019-03-29 DIAGNOSIS — M79605 Pain in left leg: Secondary | ICD-10-CM | POA: Diagnosis not present

## 2019-03-29 DIAGNOSIS — M6281 Muscle weakness (generalized): Secondary | ICD-10-CM

## 2019-03-29 NOTE — Therapy (Signed)
[place], [name], [address] Phone: [phone]   Fax:  [phone]  Physical Therapy Treatment  Patient Details  Name: William [name] MRN: [id number] Date of Birth: 03/12/1941 Referring Provider (PT): Jaffey   Encounter Date: 03/29/2019  PT End of Session - 03/29/19 1429    Visit Number  9    Date for PT Re-Evaluation  04/30/19    PT Start Time  1884    PT Stop Time  1428    PT Time Calculation (min)  43 min    Activity Tolerance  Patient tolerated treatment well    Behavior During Therapy  [facility] for tasks assessed/performed;Anxious       Past Medical History:  Diagnosis Date  . ANXIETY   . Arthritis    low back  . Bladder stone   . BPH (benign prostatic hypertrophy)   . Carotid artery occlusion   . 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 CVD (cerebrovascular disease)   . Eczema   . GERD (gastroesophageal reflux disease)   . History of carotid artery stenosis    S/P BILATERAL CEA  . 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 Hyperlipidemia   . Hypertension   . Nocturia   . S/P carotid endarterectomy    BILATERAL ICA--  PATENT PER DUPLEX  05-19-2012  . Squamous cell carcinoma    right calf  . Stroke [facility]) 1996   pt states no weaknes but has severe anxiety  . Urinary frequency   . Vitamin D deficiency     Past Surgical History:  Procedure Laterality Date  . APPENDECTOMY  AS CHILD  . CARDIOVASCULAR STRESS TEST  03-27-2012  DR CRENSHAW   LOW RISK LEXISCAN STUDY-- PROBABLE NORMAL PERFUSION AND SOFT TISSUE ATTENUATION/  NO ISCHEMIA/ EF 51%  . CAROTID ENDARTERECTOMY Bilateral LEFT  11-12-2008  DR GREG HAYES   RIGHT ICA  2006  ([facility])  . CYSTOSCOPY WITH LITHOLAPAXY N/A 02/26/2013   Procedure: CYSTOSCOPY WITH LITHOLAPAXY;  Surgeon: Franchot Gallo, MD;  Location: [facility];  Service: Urology;  Laterality: N/A;  . EYE  SURGERY  Jan. 2016   cataract surgery both eyes  . INGUINAL HERNIA REPAIR Right 11-08-2006  . MASS EXCISION N/A 03/03/2016   Procedure: EXCISION OF BACK  MASS;  Surgeon: Stark Klein, MD;  Location: [place];  Service: General;  Laterality: N/A;  . 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 PROSTATE SURGERY    . TRANSURETHRAL RESECTION OF PROSTATE N/A 02/26/2013   Procedure: TRANSURETHRAL RESECTION OF THE PROSTATE WITH GYRUS INSTRUMENTS;  Surgeon: Franchot Gallo, MD;  Location: [facility];  Service: Urology;  Laterality: N/A;    There were no vitals filed for this visit.  Subjective Assessment - 03/29/19 1351    Subjective  Pt reports some L hip pain Tuesday afternoon.    Pertinent History  CVA, depression, anxiety    Patient Stated Goals  be stronger, walk around the block with his wife    Currently in Pain?  Yes    Pain Score  5     Pain Location  Hip    Pain Orientation  Left                       [facility] Adult PT Treatment/Exercise - 03/29/19 0001  Knee/Hip Exercises: Aerobic   Nustep  level 5 x 6 minutes    Other Aerobic  UBE level 3 x 6 minutes      Knee/Hip Exercises: Machines for Strengthening   Cybex Knee Extension  5lb 2x15    Cybex Knee Flexion  25lb 2x15    Other Machine  seated rows 20#, lats 20# 2x10, chest press 5# 2x10      Knee/Hip Exercises: Standing   Other Standing Knee Exercises  Alt 4in box taps 2x10      Knee/Hip Exercises: Seated   Ball Squeeze  2x10    Marching  Both;2 sets;10 reps    Abduction/Adduction   Both;2 sets;10 reps    Abd/Adduction Limitations  red babd               PT Short Term Goals - 03/07/19 1403      PT SHORT TERM GOAL #1   Title  independent with initial HEP    Status  On-going        PT Long Term Goals - 03/22/19 1402      PT LONG TERM GOAL #1   Title  Understand fall risks, the importance of exercise and flexibility to health and function       PT LONG TERM GOAL #3   Title  walk around our building without rest     Status  On-going            Plan - 03/29/19 1429    Clinical Impression Statement  Pt enter clinic reporting increase L hip pain that started Tuesday afternoon. He thinks that came form the leg press. Avoided leg press, added seated ball squeezes and hip adduction in the seated position. Cues for full ROM with seated extensions. No issues with alt box taps.    Comorbidities  CVA, confusion, anxiety, depression, back pain    Stability/Clinical Decision Making  Evolving/Moderate complexity    Rehab Potential  Good    PT Frequency  2x / week    PT Treatment/Interventions  ADLs/Self Care Home Management;Gait training;Stair training;Functional mobility training;Neuromuscular re-education;Balance training;Therapeutic exercise;Therapeutic activities;Patient/family education;Manual techniques    PT Next Visit Plan  Continue to work on strength, function and balance, outdoor ambulation       Patient will benefit from skilled therapeutic intervention in order to improve the following deficits and impairments:  Abnormal gait, Decreased cognition, Cardiopulmonary status limiting activity, Decreased activity tolerance, Decreased endurance, Decreased range of motion, Decreased strength, Decreased balance, Difficulty walking, Decreased safety awareness, Improper body mechanics, Decreased mobility  Visit Diagnosis: Pain in left leg  Muscle weakness (generalized)  Difficulty in walking, not elsewhere classified     Problem List Patient Active Problem List   Diagnosis Date Noted  . Lumbar spondylosis 05/02/2018  . Black head 04/28/2018  . Pain in left knee 03/09/2018  . 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 Trochanteric bursitis of left hip 01/16/2018  . 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 Preventative health care 09/26/2017  . HTN (hypertension) 07/19/2015  .  Hyperlipidemia 07/19/2015  . Closed fracture of fifth metacarpal bone 05/15/2015  . 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  . Great toe pain 02/11/2014  . Dementia with behavioral disturbance ([place]) 01/09/2014  . History of right MCA stroke 11/09/2013  . Obesity (BMI 30-39.9) 06/25/2013  . Renal insufficiency 06/25/2013  . Weakness of left arm 06/25/2013  . Aftercare following surgery of the circulatory system, [place]  05/17/2013  . Confusion 04/17/2012  . Sebaceous cyst 03/03/2011  . LUMBAR SPRAIN AND STRAIN 07/31/2010  . URINARY FREQUENCY 09/25/2009  . RIB PAIN, LEFT SIDED 08/29/2009  . DEPRESSION/ANXIETY 08/01/2009  . CAROTID ARTERY STENOSIS, BILATERAL 05/02/2009  . DIZZINESS 05/02/2009  . HEADACHE 05/02/2009  . ECZEMA, ATOPIC 05/31/2008  . UNSPECIFIED VITAMIN D DEFICIENCY 03/01/2008  . FATIGUE 01/26/2008  . BPH (benign prostatic hyperplasia) 08/06/2007  . ANXIETY 12/21/2006  . HOARSENESS 12/21/2006  . FASTING HYPERGLYCEMIA 12/21/2006  . Hyperlipidemia LDL goal <100 09/27/2006  . Unspecified cerebral artery occlusion with cerebral infarction 09/27/2006    Scot Jun, PTA 03/29/2019, 2:37 PM  [place], [name], [address] Phone: [phone]   Fax:  [phone]  Name: William [name] MRN: [id number] Date of Birth: 08-19-1940

## 2019-04-03 ENCOUNTER — Other Ambulatory Visit: Payer: Self-pay

## 2019-04-03 ENCOUNTER — Ambulatory Visit: Payer: PPO | Admitting: Physical Therapy

## 2019-04-03 ENCOUNTER — Encounter: Payer: Self-pay | Admitting: Physical Therapy

## 2019-04-03 DIAGNOSIS — R262 Difficulty in walking, not elsewhere classified: Secondary | ICD-10-CM

## 2019-04-03 DIAGNOSIS — M79605 Pain in left leg: Secondary | ICD-10-CM | POA: Diagnosis not present

## 2019-04-03 DIAGNOSIS — M6281 Muscle weakness (generalized): Secondary | ICD-10-CM

## 2019-04-03 NOTE — Therapy (Signed)
[place] [address] [place], [name], [address] Phone: [phone]   Fax:  [phone] Progress Note Reporting Period 02/28/19 to 04/03/19 for the first 10 visits  See note below for Objective Data and Assessment of Progress/Goals.      Physical Therapy Treatment  Patient Details  Name: William [name] MRN: [id number] Date of Birth: [birth year] Referring Provider (PT): Jaffey   Encounter Date: 04/03/2019  PT End of Session - 04/03/19 2297    Visit Number  10    Date for PT Re-Evaluation  04/30/19    PT Start Time  9892    PT Stop Time  1428    PT Time Calculation (min)  43 min    Activity Tolerance  Patient tolerated treatment well    Behavior During Therapy  [facility] for tasks assessed/performed;Anxious       Past Medical History:  Diagnosis Date  . ANXIETY   . Arthritis    low back  . Bladder stone   . BPH (benign prostatic hypertrophy)   . Carotid artery occlusion   . 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 CVD (cerebrovascular disease)   . Eczema   . GERD (gastroesophageal reflux disease)   . History of carotid artery stenosis    S/P BILATERAL CEA  . 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 Hyperlipidemia   . Hypertension   . Nocturia   . S/P carotid endarterectomy    BILATERAL ICA--  PATENT PER DUPLEX  05-19-2012  . Squamous cell carcinoma    right calf  . Stroke [facility]) 1996   pt states no weaknes but has severe anxiety  . Urinary frequency   . Vitamin D deficiency     Past Surgical History:  Procedure Laterality Date  . APPENDECTOMY  AS CHILD  . CARDIOVASCULAR STRESS TEST  03-27-2012  DR CRENSHAW   LOW RISK LEXISCAN STUDY-- PROBABLE NORMAL PERFUSION AND SOFT TISSUE ATTENUATION/  NO ISCHEMIA/ EF 51%  . CAROTID ENDARTERECTOMY Bilateral LEFT  11-12-2008  DR GREG HAYES   RIGHT ICA  2006  ([facility])  . CYSTOSCOPY WITH LITHOLAPAXY N/A 02/26/2013    Procedure: CYSTOSCOPY WITH LITHOLAPAXY;  Surgeon: Franchot Gallo, MD;  Location: [facility];  Service: Urology;  Laterality: N/A;  . EYE SURGERY  Jan. 2016   cataract surgery both eyes  . INGUINAL HERNIA REPAIR Right 11-08-2006  . MASS EXCISION N/A 03/03/2016   Procedure: EXCISION OF BACK  MASS;  Surgeon: Stark Klein, MD;  Location: [place];  Service: General;  Laterality: N/A;  . 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 PROSTATE SURGERY    . TRANSURETHRAL RESECTION OF PROSTATE N/A 02/26/2013   Procedure: TRANSURETHRAL RESECTION OF THE PROSTATE WITH GYRUS INSTRUMENTS;  Surgeon: Franchot Gallo, MD;  Location[facility];  Service: Urology;  Laterality: N/A;    There were no vitals filed for this visit.  Subjective Assessment - 04/03/19 1348    Subjective  "Not too bad my hip hurt, it is getting better"    Currently in Pain?  Yes    Pain Score  3     Pain Location  Hip    Pain Orientation  Left                       [facility] Adult PT Treatment/Exercise - 04/03/19  0001      Knee/Hip Exercises: Aerobic   Nustep  level 5 x 6 minutes    Other Aerobic  UBE level 3 x 5 minutes      Knee/Hip Exercises: Machines for Strengthening   Cybex Knee Extension  5lb 2x15    Cybex Knee Flexion  25lb 2x15    Other Machine  seated rows 20#, lats 20# 2x10, chest press 5# 2x15      Knee/Hip Exercises: Seated   Ball Squeeze  2x10    Marching  Both;2 sets;10 reps    Marching Weights  3 lbs.    Abduction/Adduction   Both;2 sets;10 reps   RLE    Abd/Adduction Limitations  green Tband                PT Short Term Goals - 03/07/19 1403      PT SHORT TERM GOAL #1   Title  independent with initial HEP    Status  On-going        PT Long Term Goals - 04/03/19 1438      PT LONG TERM GOAL #1   Title  Understand fall risks, the importance of exercise and flexibility to health and function    Status  Achieved       PT LONG TERM GOAL #2   Title  decrease TUG time to 16 seconds    Status  Achieved      PT LONG TERM GOAL #3   Title  walk around our building without rest     Status  On-going            Plan - 04/03/19 1439    Clinical Impression Statement  Pt continues to report some L hip pain but that has improved. Increase reps tolerated with seated rows and lat. Cues needed for seated Leg curls to complete the full ROM. Weakness noted with LAQ under light resistance.    Comorbidities  CVA, confusion, anxiety, depression, back pain    Stability/Clinical Decision Making  Evolving/Moderate complexity    Rehab Potential  Good    PT Frequency  2x / week    PT Duration  8 weeks    PT Treatment/Interventions  ADLs/Self Care Home Management;Gait training;Stair training;Functional mobility training;Neuromuscular re-education;Balance training;Therapeutic exercise;Therapeutic activities;Patient/family education;Manual techniques    PT Next Visit Plan  Continue to work on strength, function and balance, outdoor ambulation       Patient will benefit from skilled therapeutic intervention in order to improve the following deficits and impairments:  Abnormal gait, Decreased cognition, Cardiopulmonary status limiting activity, Decreased activity tolerance, Decreased endurance, Decreased range of motion, Decreased strength, Decreased balance, Difficulty walking, Decreased safety awareness, Improper body mechanics, Decreased mobility  Visit Diagnosis: Pain in left leg  Difficulty in walking, not elsewhere classified  Muscle weakness (generalized)     Problem List Patient Active Problem List   Diagnosis Date Noted  . Lumbar spondylosis 05/02/2018  . Black head 04/28/2018  . Pain in left knee 03/09/2018  . 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 Trochanteric bursitis of left hip 01/16/2018  . 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 Preventative health  care 09/26/2017  . HTN (hypertension) 07/19/2015  . Hyperlipidemia 07/19/2015  . Closed fracture of fifth metacarpal bone 05/15/2015  . 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  . Great toe pain 02/11/2014  . Dementia with behavioral disturbance ([place]) 01/09/2014  . History of right MCA stroke  11/09/2013  . Obesity (BMI 30-39.9) 06/25/2013  . Renal insufficiency 06/25/2013  . Weakness of left arm 06/25/2013  . Aftercare following surgery of the circulatory system, [place] 05/17/2013  . Confusion 04/17/2012  . Sebaceous cyst 03/03/2011  . LUMBAR SPRAIN AND STRAIN 07/31/2010  . URINARY FREQUENCY 09/25/2009  . RIB PAIN, LEFT SIDED 08/29/2009  . DEPRESSION/ANXIETY 08/01/2009  . CAROTID ARTERY STENOSIS, BILATERAL 05/02/2009  . DIZZINESS 05/02/2009  . HEADACHE 05/02/2009  . ECZEMA, ATOPIC 05/31/2008  . UNSPECIFIED VITAMIN D DEFICIENCY 03/01/2008  . FATIGUE 01/26/2008  . BPH (benign prostatic hyperplasia) 08/06/2007  . ANXIETY 12/21/2006  . HOARSENESS 12/21/2006  . FASTING HYPERGLYCEMIA 12/21/2006  . Hyperlipidemia LDL goal <100 09/27/2006  . Unspecified cerebral artery occlusion with cerebral infarction 09/27/2006    Scot Jun, PTA 04/03/2019, 2:41 PM  [place] [address] [address] [place], [name], [address] Phone: [phone]   Fax:  [phone]  Name: VIVIAN OKELLEY MRN: [id number] Date of Birth: Oct 03, 1940

## 2019-04-05 ENCOUNTER — Ambulatory Visit: Payer: PPO | Admitting: Physical Therapy

## 2019-04-05 DIAGNOSIS — M47816 Spondylosis without myelopathy or radiculopathy, lumbar region: Secondary | ICD-10-CM | POA: Diagnosis not present

## 2019-04-06 ENCOUNTER — Other Ambulatory Visit: Payer: Self-pay

## 2019-04-06 ENCOUNTER — Encounter: Payer: Self-pay | Admitting: Physical Therapy

## 2019-04-06 ENCOUNTER — Ambulatory Visit: Payer: PPO | Admitting: Physical Therapy

## 2019-04-06 DIAGNOSIS — M6281 Muscle weakness (generalized): Secondary | ICD-10-CM

## 2019-04-06 DIAGNOSIS — R262 Difficulty in walking, not elsewhere classified: Secondary | ICD-10-CM

## 2019-04-06 DIAGNOSIS — M79605 Pain in left leg: Secondary | ICD-10-CM | POA: Diagnosis not present

## 2019-04-06 NOTE — Therapy (Signed)
[place], [name], [address] Phone: [phone]   Fax:  [phone]  Physical Therapy Treatment  Patient Details  Name: William [name] MRN: [id number] Date of Birth: 1940/06/27 Referring Provider (PT): Jaffey   Encounter Date: 04/06/2019  PT End of Session - 04/06/19 1045    Visit Number  11    Date for PT Re-Evaluation  04/30/19    PT Start Time  1010    PT Stop Time  1045    PT Time Calculation (min)  35 min    Activity Tolerance  Patient tolerated treatment well    Behavior During Therapy  [facility] for tasks assessed/performed;Anxious       Past Medical History:  Diagnosis Date  . ANXIETY   . Arthritis    low back  . Bladder stone   . BPH (benign prostatic hypertrophy)   . Carotid artery occlusion   . 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 CVD (cerebrovascular disease)   . Eczema   . GERD (gastroesophageal reflux disease)   . History of carotid artery stenosis    S/P BILATERAL CEA  . 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 Hyperlipidemia   . Hypertension   . Nocturia   . S/P carotid endarterectomy    BILATERAL ICA--  PATENT PER DUPLEX  05-19-2012  . Squamous cell carcinoma    right calf  . Stroke [facility]) 1996   pt states no weaknes but has severe anxiety  . Urinary frequency   . Vitamin D deficiency     Past Surgical History:  Procedure Laterality Date  . APPENDECTOMY  AS CHILD  . CARDIOVASCULAR STRESS TEST  03-27-2012  DR CRENSHAW   LOW RISK LEXISCAN STUDY-- PROBABLE NORMAL PERFUSION AND SOFT TISSUE ATTENUATION/  NO ISCHEMIA/ EF 51%  . CAROTID ENDARTERECTOMY Bilateral LEFT  11-12-2008  DR GREG HAYES   RIGHT ICA  2006  ([facility])  . CYSTOSCOPY WITH LITHOLAPAXY N/A 02/26/2013   Procedure: CYSTOSCOPY WITH LITHOLAPAXY;  Surgeon: Franchot Gallo, MD;  Location[facility];  Service: Urology;  Laterality: N/A;  . EYE  SURGERY  Jan. 2016   cataract surgery both eyes  . INGUINAL HERNIA REPAIR Right 11-08-2006  . MASS EXCISION N/A 03/03/2016   Procedure: EXCISION OF BACK  MASS;  Surgeon: Stark Klein, MD;  Location: [place];  Service: General;  Laterality: N/A;  . 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 PROSTATE SURGERY    . TRANSURETHRAL RESECTION OF PROSTATE N/A 02/26/2013   Procedure: TRANSURETHRAL RESECTION OF THE PROSTATE WITH GYRUS INSTRUMENTS;  Surgeon: Franchot Gallo, MD;  Location: [facility];  Service: Urology;  Laterality: N/A;    There were no vitals filed for this visit.  Subjective Assessment - 04/06/19 1022    Subjective  "I am ok"    Currently in Pain?  Yes    Pain Score  4     Pain Location  Hip    Pain Orientation  Left                       [facility] Adult PT Treatment/Exercise - 04/06/19 0001      Ambulation/Gait   Ambulation/Gait  Yes    Gait Pattern  Left steppage    Gait Comments  no device, gait around the front parking [place],  slow, negotiated curbs up and down      Knee/Hip Exercises: Aerobic   Other Aerobic  UBE level 3 x 3 minutes      Knee/Hip Exercises: Machines for Strengthening   Cybex Knee Flexion  25lb 2x15    Other Machine  seated rows 25#, lats 25# 2x10, chest press 10# 2x10      Knee/Hip Exercises: Seated   Long Arc Quad  Both;Weights;2 sets;15 reps;10 reps    Long Arc Quad Weight  2 lbs.    Hamstring Curl  Both;2 sets;10 reps    Hamstring Limitations  green tband                PT Short Term Goals - 03/07/19 1403      PT SHORT TERM GOAL #1   Title  independent with initial HEP    Status  On-going        PT Long Term Goals - 04/03/19 1438      PT LONG TERM GOAL #1   Title  Understand fall risks, the importance of exercise and flexibility to health and function    Status  Achieved      PT LONG TERM GOAL #2   Title  decrease TUG time to 16 seconds    Status  Achieved       PT LONG TERM GOAL #3   Title  walk around our building without rest     Status  On-going            Plan - 04/06/19 1046    Clinical Impression Statement  Progressed with outdoor ambulation but was very fatigues. Increase weight tolerated with seated row, lats, and chest press. Constant cues needed to complete HS curls and LAQ with full ROM.    Personal Factors and Comorbidities  Behavior Pattern;Comorbidity 3+    Comorbidities  CVA, confusion, anxiety, depression, back pain    Stability/Clinical Decision Making  Evolving/Moderate complexity    Rehab Potential  Good    PT Frequency  2x / week    PT Duration  8 weeks    PT Treatment/Interventions  ADLs/Self Care Home Management;Gait training;Stair training;Functional mobility training;Neuromuscular re-education;Balance training;Therapeutic exercise;Therapeutic activities;Patient/family education;Manual techniques    PT Next Visit Plan  Continue to work on strength, function and balance, outdoor ambulation       Patient will benefit from skilled therapeutic intervention in order to improve the following deficits and impairments:  Abnormal gait, Decreased cognition, Cardiopulmonary status limiting activity, Decreased activity tolerance, Decreased endurance, Decreased range of motion, Decreased strength, Decreased balance, Difficulty walking, Decreased safety awareness, Improper body mechanics, Decreased mobility  Visit Diagnosis: Difficulty in walking, not elsewhere classified  Pain in left leg  Muscle weakness (generalized)     Problem List Patient Active Problem List   Diagnosis Date Noted  . Lumbar spondylosis 05/02/2018  . Black head 04/28/2018  . Pain in left knee 03/09/2018  . 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 Trochanteric bursitis of left hip 01/16/2018  . 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 Preventative health care 09/26/2017  . HTN (hypertension) 07/19/2015  .  Hyperlipidemia 07/19/2015  . Closed fracture of fifth metacarpal bone 05/15/2015  . 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  . Great toe pain 02/11/2014  . Dementia with behavioral disturbance ([place]) 01/09/2014  . History of right MCA stroke 11/09/2013  . Obesity (BMI 30-39.9) 06/25/2013  . Renal insufficiency 06/25/2013  . Weakness of  left arm 06/25/2013  . Aftercare following surgery of the circulatory system, [place] 05/17/2013  . Confusion 04/17/2012  . Sebaceous cyst 03/03/2011  . LUMBAR SPRAIN AND STRAIN 07/31/2010  . URINARY FREQUENCY 09/25/2009  . RIB PAIN, LEFT SIDED 08/29/2009  . DEPRESSION/ANXIETY 08/01/2009  . CAROTID ARTERY STENOSIS, BILATERAL 05/02/2009  . DIZZINESS 05/02/2009  . HEADACHE 05/02/2009  . ECZEMA, ATOPIC 05/31/2008  . UNSPECIFIED VITAMIN D DEFICIENCY 03/01/2008  . FATIGUE 01/26/2008  . BPH (benign prostatic hyperplasia) 08/06/2007  . ANXIETY 12/21/2006  . HOARSENESS 12/21/2006  . FASTING HYPERGLYCEMIA 12/21/2006  . Hyperlipidemia LDL goal <100 09/27/2006  . Unspecified cerebral artery occlusion with cerebral infarction 09/27/2006    Scot Jun, PTA 04/06/2019, 10:49 AM  [place], [name], [address] Phone: [phone]   Fax:  [phone]  Name: KEENON LEITZEL MRN: [id number] Date of Birth: 1940/10/03

## 2019-04-10 ENCOUNTER — Ambulatory Visit: Payer: PPO | Admitting: Physical Therapy

## 2019-04-11 ENCOUNTER — Other Ambulatory Visit: Payer: Self-pay

## 2019-04-11 ENCOUNTER — Encounter (HOSPITAL_COMMUNITY): Payer: Self-pay | Admitting: Psychiatry

## 2019-04-11 ENCOUNTER — Ambulatory Visit (INDEPENDENT_AMBULATORY_CARE_PROVIDER_SITE_OTHER): Payer: PPO | Admitting: Psychiatry

## 2019-04-11 DIAGNOSIS — F0630 Mood disorder due to known physiological condition, unspecified: Secondary | ICD-10-CM | POA: Diagnosis not present

## 2019-04-11 MED ORDER — LORAZEPAM 0.5 MG PO TABS: ORAL_TABLET | ORAL | 5 refills | Status: DC

## 2019-04-11 MED ORDER — ESZOPICLONE 3 MG PO TABS: ORAL_TABLET | ORAL | 2 refills | Status: DC

## 2019-04-11 MED ORDER — CARBAMAZEPINE ER 100 MG PO TB12: ORAL_TABLET | ORAL | 2 refills | Status: DC

## 2019-04-11 MED FILL — LORazepam 0.5 MG TABS: 0.5 | 30 days supply | Qty: 150 | Fill #1

## 2019-04-11 NOTE — Progress Notes (Signed)
Patient ID: William [name], male   DOB: 1940/10/19, 78 y.o.   MRN: [id number] [facility] MD Progress Note  04/11/2019 1:48 PM William [name]  MRN:  [id number] Subjective:  Feeling good Principal Problem: Mild neurocognitive disorder  Today the patient is doing fairly well.  He is seen with his wife.  He is going to go back to his neurologist because the patient does not appear to be as active.  He seems to be slower.  He describes himself as being unmotivated and feeling lazy.  He absolutely denies being depressed.  He still gets enjoyment out of some things but he is slower.  He is sleeping and eating well and has good relationships with his wife.  His 3 children are doing fairly well.  The pandemic is not really affecting his lifestyle.  Patient is walking somewhat slower.  He shows no evidence of psychosis.  He shows no evident evidence of lability or irritability.  He takes his medicines as prescribed.  He shows no evidence of anxiety.  Past Medical History:  Past Medical History:  Diagnosis Date  . ANXIETY   . Arthritis    low back  . Bladder stone   . BPH (benign prostatic hypertrophy)   . Carotid artery occlusion   . 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 CVD (cerebrovascular disease)   . Eczema   . GERD (gastroesophageal reflux disease)   . History of carotid artery stenosis    S/P BILATERAL CEA  . 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 Hyperlipidemia   . Hypertension   . Nocturia   . S/P carotid endarterectomy    BILATERAL ICA--  PATENT PER DUPLEX  05-19-2012  . Squamous cell carcinoma    right calf  . Stroke [facility]) 1996   pt states no weaknes but has severe anxiety  . Urinary frequency   . Vitamin D deficiency     Past Surgical History:  Procedure Laterality Date  . APPENDECTOMY  AS CHILD  . CARDIOVASCULAR STRESS TEST  03-27-2012  DR CRENSHAW   LOW RISK LEXISCAN STUDY-- PROBABLE NORMAL PERFUSION AND SOFT TISSUE ATTENUATION/  NO  ISCHEMIA/ EF 51%  . CAROTID ENDARTERECTOMY Bilateral LEFT  11-12-2008  DR GREG HAYES   RIGHT ICA  2006  ([facility])  . CYSTOSCOPY WITH LITHOLAPAXY N/A 02/26/2013   Procedure: CYSTOSCOPY WITH LITHOLAPAXY;  Surgeon: Franchot Gallo, MD;  Location: [facility];  Service: Urology;  Laterality: N/A;  . EYE SURGERY  Jan. 2016   cataract surgery both eyes  . INGUINAL HERNIA REPAIR Right 11-08-2006  . MASS EXCISION N/A 03/03/2016   Procedure: EXCISION OF BACK  MASS;  Surgeon: Stark Klein, MD;  Location: [place];  Service: General;  Laterality: N/A;  . 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 PROSTATE SURGERY    . TRANSURETHRAL RESECTION OF PROSTATE N/A 02/26/2013   Procedure: TRANSURETHRAL RESECTION OF THE PROSTATE WITH GYRUS INSTRUMENTS;  Surgeon: Franchot Gallo, MD;  Location: [facility];  Service: Urology;  Laterality: N/A;   Family History:  Family History  Problem Relation Age of Onset  . Heart disease Mother        CHF  . Bipolar disorder Mother   . Heart disease Father        CHF   Family Psychiatric  History:  Social History:  Social History   Substance and Sexual  Activity  Alcohol Use Yes  . Alcohol/week: 0.0 standard drinks   Comment: Occasional     Social History   Substance and Sexual Activity  Drug Use No    Social History   Socioeconomic History  . Marital status: Married    Spouse name: Not on file  . Number of children: 2  . Years of education: Not on file  . Highest education level: Not on file  Occupational History    Employer: Retired  Scientific laboratory technician  . Financial resource strain: Not on file  . Food insecurity    Worry: Not on file    Inability: Not on file  . Transportation needs    Medical: Not on file    Non-medical: Not on file  Tobacco Use  . Smoking status: Former Smoker    Packs/day: 2.00    Years: 40.00    Pack years: 80.00    Types: Cigarettes    Quit date: 02/15/2005    Years  since quitting: 14.1  . Smokeless tobacco: Never Used  Substance and Sexual Activity  . Alcohol use: Yes    Alcohol/week: 0.0 standard drinks    Comment: Occasional  . Drug use: No  . Sexual activity: Yes    Partners: Female  Lifestyle  . Physical activity    Days per week: Not on file    Minutes per session: Not on file  . Stress: Not on file  Relationships  . Social Herbalist on phone: Not on file    Gets together: Not on file    Attends religious service: Not on file    Active member of club or organization: Not on file    Attends meetings of clubs or organizations: Not on file    Relationship status: Not on file  Other Topics Concern  . Not on file  Social History Narrative   Exercise--  Walks dogs everyday      LIves with wife , no stairs in home, caffeine - one cup coffee day, exercise - not much, Right handed, 12th grade, retired   Additional Social History:                         Sleep: Good  Appetite:  Good  Current Medications: Current Outpatient Medications  Medication Sig Dispense Refill  . acetaminophen (TYLENOL) 500 MG tablet Take 500 mg by mouth every 6 (six) hours as needed (1 tab as needed).    [name] amLODipine (NORVASC) 5 MG tablet TAKE 1 TABLET (5 MG TOTAL) BY MOUTH DAILY. 90 tablet 1  . aspirin 325 MG EC tablet Take 325 mg by mouth daily.    . Calcium Carb-Cholecalciferol (CALCIUM 1000 + D PO) Take by mouth daily.      . carbamazepine (TEGRETOL-XR) 100 MG 12 hr tablet 1 qam  1  qhs 180 tablet 2  . Cholecalciferol (VITAMIN D) 2000 UNITS CAPS Take by mouth.    . Eszopiclone 3 MG TABS TAKE 1 TABLET BY MOUTH AT BEDTIME AS NEEDED TAKE IMMEDIATELY BEFORE BEDTIME 90 tablet 2  . ezetimibe (ZETIA) 10 MG tablet TAKE 1 TABLET (10 MG TOTAL) BY MOUTH DAILY. 90 tablet 3  . famotidine (PEPCID) 20 MG tablet Take 20 mg by mouth 2 (two) times daily.    . fenofibrate 160 MG tablet TAKE 1 TABLET BY MOUTH DAILY. REPEAT LABS ARE DUE NOW 90 tablet 1  .  Flaxseed, Linseed, (FLAX SEED OIL PO) Take  by mouth daily.      . fluticasone (CUTIVATE) 0.05 % cream   3  . lisinopril-hydrochlorothiazide (ZESTORETIC) 10-12.5 MG tablet Take 1 tablet by mouth daily.    [name] LORazepam (ATIVAN) 0.5 MG tablet 2  Bid  1 as needed 150 tablet 5  . Multiple Vitamin (MULTIVITAMIN) tablet Take 1 tablet by mouth daily.    . NONFORMULARY OR COMPOUNDED ITEM [facility]:  Onychomycosis Nail lacquer - Fluconazole 2%, Terbinafine 1%, DMSO, apply to affected area daily. (Patient not taking: Reported on 02/13/2019) 120 each 2  . NONFORMULARY OR COMPOUNDED ITEM [facility]:  Antifungal topical - Terbinafine 3%, Fluconazole 2%, Tea Tree Oil 5%, Urea 10/%,, Ibuprofen 2% in DMSO Suspension #20ml. Apply to the affected nail(s) once (at bedtime) or twice daily. (Patient not taking: Reported on 02/13/2019) 100 each 2  . NONFORMULARY OR COMPOUNDED ITEM Compression socks  #1  Dx low ext edema and ache 1 each 0  . tamsulosin (FLOMAX) 0.4 MG CAPS capsule Take 1 capsule (0.4 mg total) by mouth daily. 90 capsule 3  . traMADol (ULTRAM) 50 MG tablet Take 1 tablet (50 mg total) by mouth every 8 (eight) hours as needed. 30 tablet 0  . Zoster Vaccine Adjuvanted [facility]) injection 0.5 ml IM x1 repeat in 2 months 0.5 mL 1   No current facility-administered medications for this visit.     Lab Results:  No results found for this or any previous visit (from the past 48 hour(s)).  Physical Findings: AIMS:  , ,  ,  ,    CIWA:    COWS:     Musculoskeletal: Strength & Muscle Tone: within normal limits Gait & Station: normal Patient leans: Right  Psychiatric Specialty Exam: ROS  There were no vitals taken for this visit.There is no height or weight on file to calculate BMI.  General Appearance: Casual  Eye Contact::  Good  Speech:  Clear and Coherent  Volume:  Normal  Mood:  Dysphoric and Euthymic  Affect:  Appropriate  Thought Process:  Coherent  Orientation:  Full (Time,  Place, and Person)  Thought Content:  WDL  Suicidal Thoughts:  No  Homicidal Thoughts:  No  Memory:  NA  Judgement:  Good  Insight:  Fair  Psychomotor Activity:  Normal  Concentration:  Good  Recall:  Good  Fund of Knowledge:Good  Language: Good  Akathisia:  No  Handed:  Right  AIMS (if indicated):     Assets:  Desire for Improvement  ADL's:  Intact  Cognition: WNL  Sleep:      Treatment Plan Summary: 04/11/2019, 1:48 PM  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  He takes Tegretol which tremendously helps his irritability and his lability.  He is even is calm he is not oversedated.  Over the next week we will get a Tegretol blood level and other appropriate labs.  His second problem is insomnia.  He takes [place] for this condition.  Patient also takes Ativan and low-dose which overall keeps him in a calm state.  His wife said he is doing fairly well other than he seems slower and less motivated.  He is going to see his neurologist in regards to this issue.  I do not think it is psychiatric in nature.  Nonetheless when he gets a Tegretol blood level next week we will also get a comprehensive metabolic panel and a and a TSH.  We will also get a CBC.  We will  look for any reasons that could possibly make him fatigued and slower.  Patient return to see me in 4 months.

## 2019-04-12 ENCOUNTER — Ambulatory Visit: Payer: PPO | Admitting: Physical Therapy

## 2019-04-12 ENCOUNTER — Encounter: Payer: Self-pay | Admitting: Physical Therapy

## 2019-04-12 ENCOUNTER — Other Ambulatory Visit: Payer: Self-pay

## 2019-04-12 DIAGNOSIS — M6281 Muscle weakness (generalized): Secondary | ICD-10-CM

## 2019-04-12 DIAGNOSIS — R262 Difficulty in walking, not elsewhere classified: Secondary | ICD-10-CM

## 2019-04-12 DIAGNOSIS — M79605 Pain in left leg: Secondary | ICD-10-CM

## 2019-04-12 NOTE — Therapy (Signed)
[place] [address] [place], [name], [address] Phone: [phone]   Fax:  [phone]  Physical Therapy Treatment  Patient Details  Name: William [name] MRN: [id number] Date of Birth: Jan 03, 1941 Referring Provider (PT): Jaffey   Encounter Date: 04/12/2019  PT End of Session - 04/12/19 1521    Visit Number  12    Date for PT Re-Evaluation  04/30/19    PT Start Time  1309    PT Stop Time  1352    PT Time Calculation (min)  43 min    Activity Tolerance  Patient tolerated treatment well    Behavior During Therapy  [facility] for tasks assessed/performed;Anxious       Past Medical History:  Diagnosis Date  . ANXIETY   . Arthritis    low back  . Bladder stone   . BPH (benign prostatic hypertrophy)   . Carotid artery occlusion   . 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 CVD (cerebrovascular disease)   . Eczema   . GERD (gastroesophageal reflux disease)   . History of carotid artery stenosis    S/P BILATERAL CEA  . 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 Hyperlipidemia   . Hypertension   . Nocturia   . S/P carotid endarterectomy    BILATERAL ICA--  PATENT PER DUPLEX  05-19-2012  . Squamous cell carcinoma    right calf  . Stroke [facility]) 1996   pt states no weaknes but has severe anxiety  . Urinary frequency   . Vitamin D deficiency     Past Surgical History:  Procedure Laterality Date  . APPENDECTOMY  AS CHILD  . CARDIOVASCULAR STRESS TEST  03-27-2012  DR CRENSHAW   LOW RISK LEXISCAN STUDY-- PROBABLE NORMAL PERFUSION AND SOFT TISSUE ATTENUATION/  NO ISCHEMIA/ EF 51%  . CAROTID ENDARTERECTOMY Bilateral LEFT  11-12-2008  DR GREG HAYES   RIGHT ICA  2006  ([facility])  . CYSTOSCOPY WITH LITHOLAPAXY N/A 02/26/2013   Procedure: CYSTOSCOPY WITH LITHOLAPAXY;  Surgeon: Franchot Gallo, MD;  Location: [facility];  Service: Urology;  Laterality: N/A;  . EYE  SURGERY  Jan. 2016   cataract surgery both eyes  . INGUINAL HERNIA REPAIR Right 11-08-2006  . MASS EXCISION N/A 03/03/2016   Procedure: EXCISION OF BACK  MASS;  Surgeon: Stark Klein, MD;  Location: [place];  Service: General;  Laterality: N/A;  . 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 PROSTATE SURGERY    . TRANSURETHRAL RESECTION OF PROSTATE N/A 02/26/2013   Procedure: TRANSURETHRAL RESECTION OF THE PROSTATE WITH GYRUS INSTRUMENTS;  Surgeon: Franchot Gallo, MD;  Location: [facility];  Service: Urology;  Laterality: N/A;    There were no vitals filed for this visit.  Subjective Assessment - 04/12/19 1322    Subjective  Patient and wife report that his low back has been hurting since the last visit, "maybe too much weight" thinks it was the leg press    Currently in Pain?  Yes    Pain Score  5     Pain Location  Back    Pain Orientation  Lower    Aggravating Factors   thinks it is the leg press that caused the LBP                       [facility]  Adult PT Treatment/Exercise - 04/12/19 0001      Ambulation/Gait   Gait Comments  no device outside about 200 feet he was very fatigued in the legs with this      High Level Balance   High Level Balance Activities  Side stepping;Backward walking    High Level Balance Comments  ball toss and ball kicks      Knee/Hip Exercises: Aerobic   Nustep  level 5 x 6 minutes      Knee/Hip Exercises: Machines for Strengthening   Other Machine  seated rows 20#, lats 20# 2x10, chest press 10# 2x10      Knee/Hip Exercises: Standing   Hip Flexion  Both;2 sets;10 reps    Hip Abduction  Both;2 sets;10 reps    Hip Extension  Both;2 sets;10 reps      Knee/Hip Exercises: Seated   Long Arc Quad  Both;Weights;2 sets;15 reps;10 reps    Long Arc Quad Weight  3 lbs.    Hamstring Curl  Both;2 sets;10 reps    Hamstring Limitations  green tband                PT Short Term Goals -  03/07/19 1403      PT SHORT TERM GOAL #1   Title  independent with initial HEP    Status  On-going        PT Long Term Goals - 04/12/19 1522      PT LONG TERM GOAL #3   Title  walk around our building without rest     Status  On-going      PT LONG TERM GOAL #4   Title  decrease pain with walking by 50%    Status  On-going      PT LONG TERM GOAL #5   Title  increase lumbar ROM 25%    Status  On-going            Plan - 04/12/19 1521    Clinical Impression Statement  Patient reports increased LBP over the past few days, he is a little lethargic today, needs cues to stay on task especially left arm motions and exercises and some with the left leg.  Balance was good today, some difficulty with higher tosses    PT Next Visit Plan  he may get injections in the back in the future, will go slow until then    Consulted and Agree with Plan of Care  Patient       Patient will benefit from skilled therapeutic intervention in order to improve the following deficits and impairments:  Abnormal gait, Decreased cognition, Cardiopulmonary status limiting activity, Decreased activity tolerance, Decreased endurance, Decreased range of motion, Decreased strength, Decreased balance, Difficulty walking, Decreased safety awareness, Improper body mechanics, Decreased mobility  Visit Diagnosis: Difficulty in walking, not elsewhere classified  Pain in left leg  Muscle weakness (generalized)     Problem List Patient Active Problem List   Diagnosis Date Noted  . Lumbar spondylosis 05/02/2018  . Black head 04/28/2018  . Pain in left knee 03/09/2018  . 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 Trochanteric bursitis of left hip 01/16/2018  . 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 Preventative health care 09/26/2017  . HTN (hypertension) 07/19/2015  . Hyperlipidemia 07/19/2015  . Closed fracture of fifth metacarpal bone 05/15/2015  . Basal cell  carcinoma of face 12/26/2014  . Great toe pain 02/11/2014  . Dementia with behavioral  disturbance ([place]) 01/09/2014  . History of right MCA stroke 11/09/2013  . Obesity (BMI 30-39.9) 06/25/2013  . Renal insufficiency 06/25/2013  . Weakness of left arm 06/25/2013  . Aftercare following surgery of the circulatory system, [place] 05/17/2013  . Confusion 04/17/2012  . Sebaceous cyst 03/03/2011  . LUMBAR SPRAIN AND STRAIN 07/31/2010  . URINARY FREQUENCY 09/25/2009  . RIB PAIN, LEFT SIDED 08/29/2009  . DEPRESSION/ANXIETY 08/01/2009  . CAROTID ARTERY STENOSIS, BILATERAL 05/02/2009  . DIZZINESS 05/02/2009  . HEADACHE 05/02/2009  . ECZEMA, ATOPIC 05/31/2008  . UNSPECIFIED VITAMIN D DEFICIENCY 03/01/2008  . FATIGUE 01/26/2008  . BPH (benign prostatic hyperplasia) 08/06/2007  . ANXIETY 12/21/2006  . HOARSENESS 12/21/2006  . FASTING HYPERGLYCEMIA 12/21/2006  . Hyperlipidemia LDL goal <100 09/27/2006  . Unspecified cerebral artery occlusion with cerebral infarction 09/27/2006    Sumner Boast., PT 04/12/2019, 3:24 PM  [place], [name], [address] Phone: [phone]   Fax:  [phone]  Name: William [name] MRN: [id number] Date of Birth: 1940/10/19

## 2019-04-16 ENCOUNTER — Other Ambulatory Visit: Payer: Self-pay | Admitting: Family Medicine

## 2019-04-16 DIAGNOSIS — E785 Hyperlipidemia, unspecified: Secondary | ICD-10-CM

## 2019-04-16 MED FILL — EZETIMIBE 10 MG TABS: 10 | 90 days supply | Qty: 90 | Fill #0

## 2019-04-16 MED FILL — LISINOPRIL-HCTZ 10-12.5 MG: 10-12.5 | 30 days supply | Qty: 30 | Fill #0

## 2019-04-17 ENCOUNTER — Ambulatory Visit: Payer: PPO | Admitting: Physical Therapy

## 2019-04-17 DIAGNOSIS — M47816 Spondylosis without myelopathy or radiculopathy, lumbar region: Secondary | ICD-10-CM | POA: Diagnosis not present

## 2019-04-17 MED FILL — SHINGRIX 50 MCG SUS: 50 | 1 days supply | Qty: 1 | Fill #1

## 2019-04-18 ENCOUNTER — Encounter: Payer: PPO | Admitting: Physical Therapy

## 2019-04-19 ENCOUNTER — Ambulatory Visit: Payer: PPO | Admitting: Physical Therapy

## 2019-04-20 ENCOUNTER — Ambulatory Visit: Payer: PPO | Attending: Neurology | Admitting: Physical Therapy

## 2019-04-20 ENCOUNTER — Other Ambulatory Visit: Payer: Self-pay

## 2019-04-20 ENCOUNTER — Encounter: Payer: Self-pay | Admitting: Physical Therapy

## 2019-04-20 DIAGNOSIS — M6281 Muscle weakness (generalized): Secondary | ICD-10-CM | POA: Diagnosis not present

## 2019-04-20 DIAGNOSIS — R262 Difficulty in walking, not elsewhere classified: Secondary | ICD-10-CM | POA: Diagnosis not present

## 2019-04-20 DIAGNOSIS — M79605 Pain in left leg: Secondary | ICD-10-CM

## 2019-04-20 NOTE — Therapy (Signed)
[place], [name], [address] Phone: [phone]   Fax:  [phone]  Physical Therapy Treatment  Patient Details  Name: William [name] MRN: [id number] Date of Birth: 1941/01/17 Referring Provider (PT): Jaffey   Encounter Date: 04/20/2019  PT End of Session - 04/20/19 0917    Visit Number  13    Date for PT Re-Evaluation  04/30/19    PT Start Time  5520    PT Stop Time  0933    PT Time Calculation (min)  46 min    Activity Tolerance  Patient limited by pain    Behavior During Therapy  [facility] for tasks assessed/performed       Past Medical History:  Diagnosis Date  . ANXIETY   . Arthritis    low back  . Bladder stone   . BPH (benign prostatic hypertrophy)   . Carotid artery occlusion   . 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 CVD (cerebrovascular disease)   . Eczema   . GERD (gastroesophageal reflux disease)   . History of carotid artery stenosis    S/P BILATERAL CEA  . 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 Hyperlipidemia   . Hypertension   . Nocturia   . S/P carotid endarterectomy    BILATERAL ICA--  PATENT PER DUPLEX  05-19-2012  . Squamous cell carcinoma    right calf  . Stroke [facility]) 1996   pt states no weaknes but has severe anxiety  . Urinary frequency   . Vitamin D deficiency     Past Surgical History:  Procedure Laterality Date  . APPENDECTOMY  AS CHILD  . CARDIOVASCULAR STRESS TEST  03-27-2012  DR CRENSHAW   LOW RISK LEXISCAN STUDY-- PROBABLE NORMAL PERFUSION AND SOFT TISSUE ATTENUATION/  NO ISCHEMIA/ EF 51%  . CAROTID ENDARTERECTOMY Bilateral LEFT  11-12-2008  DR GREG HAYES   RIGHT ICA  2006  ([facility])  . CYSTOSCOPY WITH LITHOLAPAXY N/A 02/26/2013   Procedure: CYSTOSCOPY WITH LITHOLAPAXY;  Surgeon: Franchot Gallo, MD;  Location: [facility];  Service: Urology;  Laterality: N/A;  . EYE SURGERY  Jan. 2016    cataract surgery both eyes  . INGUINAL HERNIA REPAIR Right 11-08-2006  . MASS EXCISION N/A 03/03/2016   Procedure: EXCISION OF BACK  MASS;  Surgeon: Stark Klein, MD;  Location: [place];  Service: General;  Laterality: N/A;  . 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 PROSTATE SURGERY    . TRANSURETHRAL RESECTION OF PROSTATE N/A 02/26/2013   Procedure: TRANSURETHRAL RESECTION OF THE PROSTATE WITH GYRUS INSTRUMENTS;  Surgeon: Franchot Gallo, MD;  Location[facility];  Service: Urology;  Laterality: N/A;    There were no vitals filed for this visit.  Subjective Assessment - 04/20/19 0851    Subjective  Patient reports that his back is feeling better, but the left hip is really hurting, he thinks it may be from compensation    Currently in Pain?  Yes    Pain Score  8     Pain Location  Hip    Pain Orientation  Left    Pain Descriptors / Indicators  Aching    Aggravating Factors   walking    Pain Relieving Factors  heat helps some  [place] Adult PT Treatment/Exercise - 04/20/19 0001      Knee/Hip Exercises: Stretches   Passive Hamstring Stretch  Left;5 reps;10 seconds    Passive Hamstring Stretch Limitations  very painful      Knee/Hip Exercises: Aerobic   Nustep  tried nustep but this caused hip pain    Other Aerobic  UBE level 3 x 3 minutes      Knee/Hip Exercises: Machines for Strengthening   Other Machine  seated rows 20#, lats 20# 2x10, chest press 10# 2x10      Knee/Hip Exercises: Seated   Long Arc Quad  Both;Weights;2 sets;15 reps;10 reps    Long Arc Quad Weight  3 lbs.    Long [employer] Limitations  had pain iwth the left, had to do without weight    Hamstring Curl  Both;2 sets;10 reps    Hamstring Limitations  green tband       Modalities   Modalities  Electrical Stimulation;Moist Heat      Moist Heat Therapy   Number Minutes Moist Heat  15 Minutes    Moist Heat Location  Hip       Electrical Stimulation   Electrical Stimulation Location  left hip area    Electrical Stimulation Action  IFC    Electrical Stimulation Parameters  sitting    Electrical Stimulation Goals  Pain               PT Short Term Goals - 04/20/19 0920      PT SHORT TERM GOAL #1   Title  independent with initial HEP    Status  Partially Met        PT Long Term Goals - 04/20/19 0920      PT LONG TERM GOAL #1   Title  Understand fall risks, the importance of exercise and flexibility to health and function    Status  Achieved      PT LONG TERM GOAL #4   Title  decrease pain with walking by 50%    Status  On-going            Plan - 04/20/19 0918    Clinical Impression Statement  Patient with increased left hip pain, reports that his back is feeling better, he reports pain with any walking , he is very tight in the left HS with pain increased in the left hip with this.  He reports that he will be seeing the pain MD in 2 weeks.  The pain is definitely limiting his ability to function and walk.  He told me that he does not like pain and tries to avoid it    PT Next Visit Plan  try to continue to work on his abilitites but will have to limit activities due to his pain levels    Consulted and Agree with Plan of Care  Patient       Patient will benefit from skilled therapeutic intervention in order to improve the following deficits and impairments:  Abnormal gait, Decreased cognition, Cardiopulmonary status limiting activity, Decreased activity tolerance, Decreased endurance, Decreased range of motion, Decreased strength, Decreased balance, Difficulty walking, Decreased safety awareness, Improper body mechanics, Decreased mobility  Visit Diagnosis: Difficulty in walking, not elsewhere classified  Muscle weakness (generalized)  Pain in left leg     Problem List Patient Active Problem List   Diagnosis Date Noted  . Lumbar spondylosis 05/02/2018  . Black head 04/28/2018   . Pain in left knee 03/09/2018  . Osteoarthritis of  left hip 01/16/2018  . Trochanteric bursitis of left hip 01/16/2018  . 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 Preventative health care 09/26/2017  . HTN (hypertension) 07/19/2015  . Hyperlipidemia 07/19/2015  . Closed fracture of fifth metacarpal bone 05/15/2015  . 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  . Great toe pain 02/11/2014  . Dementia with behavioral disturbance ([place]) 01/09/2014  . History of right MCA stroke 11/09/2013  . Obesity (BMI 30-39.9) 06/25/2013  . Renal insufficiency 06/25/2013  . Weakness of left arm 06/25/2013  . Aftercare following surgery of the circulatory system, [place] 05/17/2013  . Confusion 04/17/2012  . Sebaceous cyst 03/03/2011  . LUMBAR SPRAIN AND STRAIN 07/31/2010  . URINARY FREQUENCY 09/25/2009  . RIB PAIN, LEFT SIDED 08/29/2009  . DEPRESSION/ANXIETY 08/01/2009  . CAROTID ARTERY STENOSIS, BILATERAL 05/02/2009  . DIZZINESS 05/02/2009  . HEADACHE 05/02/2009  . ECZEMA, ATOPIC 05/31/2008  . UNSPECIFIED VITAMIN D DEFICIENCY 03/01/2008  . FATIGUE 01/26/2008  . BPH (benign prostatic hyperplasia) 08/06/2007  . ANXIETY 12/21/2006  . HOARSENESS 12/21/2006  . FASTING HYPERGLYCEMIA 12/21/2006  . Hyperlipidemia LDL goal <100 09/27/2006  . Unspecified cerebral artery occlusion with cerebral infarction 09/27/2006    Sumner Boast., PT 04/20/2019, 9:21 AM  [place], [name], [address] Phone: [phone]   Fax:  [phone]  Name: JOVANY DISANO MRN: [id number] Date of Birth: 02-Jul-1940

## 2019-04-24 ENCOUNTER — Ambulatory Visit: Payer: PPO | Admitting: Physical Therapy

## 2019-04-24 ENCOUNTER — Other Ambulatory Visit: Payer: Self-pay

## 2019-04-24 ENCOUNTER — Encounter: Payer: Self-pay | Admitting: Physical Therapy

## 2019-04-24 DIAGNOSIS — M6281 Muscle weakness (generalized): Secondary | ICD-10-CM

## 2019-04-24 DIAGNOSIS — R262 Difficulty in walking, not elsewhere classified: Secondary | ICD-10-CM

## 2019-04-24 DIAGNOSIS — M79605 Pain in left leg: Secondary | ICD-10-CM

## 2019-04-24 NOTE — Therapy (Signed)
[place], [name], [address] Phone: [phone]   Fax:  [phone]  Physical Therapy Treatment  Patient Details  Name: William [name] MRN: [id number] Date of Birth: [birth year] Referring Provider (PT): Jaffey   Encounter Date: 04/24/2019  PT End of Session - 04/24/19 1352    Visit Number  14    Date for PT Re-Evaluation  04/30/19    PT Start Time  1315    PT Stop Time  1412    PT Time Calculation (min)  57 min    Activity Tolerance  Patient limited by pain    Behavior During Therapy  [facility] for tasks assessed/performed       Past Medical History:  Diagnosis Date  . ANXIETY   . Arthritis    low back  . Bladder stone   . BPH (benign prostatic hypertrophy)   . Carotid artery occlusion   . 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 CVD (cerebrovascular disease)   . Eczema   . GERD (gastroesophageal reflux disease)   . History of carotid artery stenosis    S/P BILATERAL CEA  . 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 Hyperlipidemia   . Hypertension   . Nocturia   . S/P carotid endarterectomy    BILATERAL ICA--  PATENT PER DUPLEX  05-19-2012  . Squamous cell carcinoma    right calf  . Stroke [facility]) 1996   pt states no weaknes but has severe anxiety  . Urinary frequency   . Vitamin D deficiency     Past Surgical History:  Procedure Laterality Date  . APPENDECTOMY  AS CHILD  . CARDIOVASCULAR STRESS TEST  03-27-2012  DR CRENSHAW   LOW RISK LEXISCAN STUDY-- PROBABLE NORMAL PERFUSION AND SOFT TISSUE ATTENUATION/  NO ISCHEMIA/ EF 51%  . CAROTID ENDARTERECTOMY Bilateral LEFT  11-12-2008  DR GREG HAYES   RIGHT ICA  2006  ([facility])  . CYSTOSCOPY WITH LITHOLAPAXY N/A 02/26/2013   Procedure: CYSTOSCOPY WITH LITHOLAPAXY;  Surgeon: Franchot Gallo, MD;  Location: [facility];  Service: Urology;  Laterality: N/A;  . EYE SURGERY  Jan. 2016    cataract surgery both eyes  . INGUINAL HERNIA REPAIR Right 11-08-2006  . MASS EXCISION N/A 03/03/2016   Procedure: EXCISION OF BACK  MASS;  Surgeon: Stark Klein, MD;  Location: [place];  Service: General;  Laterality: N/A;  . 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 PROSTATE SURGERY    . TRANSURETHRAL RESECTION OF PROSTATE N/A 02/26/2013   Procedure: TRANSURETHRAL RESECTION OF THE PROSTATE WITH GYRUS INSTRUMENTS;  Surgeon: Franchot Gallo, MD;  Location: [facility];  Service: Urology;  Laterality: N/A;    There were no vitals filed for this visit.  Subjective Assessment - 04/24/19 1324    Subjective  Patient reports that he felt a little better, still hurting    Currently in Pain?  Yes    Pain Score  6     Pain Location  Hip    Pain Orientation  Left    Aggravating Factors   walking and standing                       [facility] Adult PT Treatment/Exercise - 04/24/19 0001      High Level Balance   High Level Balance Activities  Side  stepping;Backward walking    High Level Balance Comments  ball toss and ball kicks      Knee/Hip Exercises: Aerobic   Other Aerobic  UBE level 3 x 3 minutes      Knee/Hip Exercises: Machines for Strengthening   Cybex Knee Extension  5lb 2x15    Cybex Knee Flexion  25lb 2x15    Other Machine  seated rows 20#, lats 20# 2x10, chest press 10# 2x10      Knee/Hip Exercises: Standing   Hip Flexion  Both;2 sets;10 reps    Hip Flexion Limitations  2#    Hip Abduction  Both;2 sets;10 reps    Abduction Limitations  2#    Hip Extension  Both;2 sets;10 reps    Extension Limitations  2#      Knee/Hip Exercises: Seated   Ball Squeeze  2x10      Knee/Hip Exercises: Supine   Other Supine Knee/Hip Exercises  feet on ball K2c, trunk rotation, bridges and isometric abs      Modalities   Modalities  Electrical Stimulation;Moist Heat      Moist Heat Therapy   Number Minutes Moist Heat  15 Minutes     Moist Heat Location  Hip      Electrical Stimulation   Electrical Stimulation Location  left hip area    Electrical Stimulation Action  IFC    Electrical Stimulation Parameters  sitting    Electrical Stimulation Goals  Pain               PT Short Term Goals - 04/20/19 0920      PT SHORT TERM GOAL #1   Title  independent with initial HEP    Status  Partially Met        PT Long Term Goals - 04/24/19 1354      PT LONG TERM GOAL #2   Title  decrease TUG time to 16 seconds    Status  Achieved      PT LONG TERM GOAL #3   Title  walk around our building without rest     Status  On-going      PT LONG TERM GOAL #4   Title  decrease pain with walking by 50%    Status  On-going            Plan - 04/24/19 1353    Clinical Impression Statement  Patient continues to have some increased left LBP and hip pain, it is better than last week and he reports less pain with walking, he has difficulty especially with left arm he shows some significant neglect with this arm but can correct with cues.  Balance is an issue as he tends to drag the left foot at times    PT Treatment/Interventions  ADLs/Self Care Home Management;Gait training;Stair training;Functional mobility training;Neuromuscular re-education;Balance training;Therapeutic exercise;Therapeutic activities;Patient/family education;Manual techniques    Consulted and Agree with Plan of Care  Patient       Patient will benefit from skilled therapeutic intervention in order to improve the following deficits and impairments:  Abnormal gait, Decreased cognition, Cardiopulmonary status limiting activity, Decreased activity tolerance, Decreased endurance, Decreased range of motion, Decreased strength, Decreased balance, Difficulty walking, Decreased safety awareness, Improper body mechanics, Decreased mobility  Visit Diagnosis: Difficulty in walking, not elsewhere classified  Muscle weakness (generalized)  Pain in left  leg     Problem List Patient Active Problem List   Diagnosis Date Noted  . Lumbar spondylosis 05/02/2018  . Black head  04/28/2018  . Pain in left knee 03/09/2018  . 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 Trochanteric bursitis of left hip 01/16/2018  . 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 Preventative health care 09/26/2017  . HTN (hypertension) 07/19/2015  . Hyperlipidemia 07/19/2015  . Closed fracture of fifth metacarpal bone 05/15/2015  . 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  . Great toe pain 02/11/2014  . Dementia with behavioral disturbance ([place]) 01/09/2014  . History of right MCA stroke 11/09/2013  . Obesity (BMI 30-39.9) 06/25/2013  . Renal insufficiency 06/25/2013  . Weakness of left arm 06/25/2013  . Aftercare following surgery of the circulatory system, [place] 05/17/2013  . Confusion 04/17/2012  . Sebaceous cyst 03/03/2011  . LUMBAR SPRAIN AND STRAIN 07/31/2010  . URINARY FREQUENCY 09/25/2009  . RIB PAIN, LEFT SIDED 08/29/2009  . DEPRESSION/ANXIETY 08/01/2009  . CAROTID ARTERY STENOSIS, BILATERAL 05/02/2009  . DIZZINESS 05/02/2009  . HEADACHE 05/02/2009  . ECZEMA, ATOPIC 05/31/2008  . UNSPECIFIED VITAMIN D DEFICIENCY 03/01/2008  . FATIGUE 01/26/2008  . BPH (benign prostatic hyperplasia) 08/06/2007  . ANXIETY 12/21/2006  . HOARSENESS 12/21/2006  . FASTING HYPERGLYCEMIA 12/21/2006  . Hyperlipidemia LDL goal <100 09/27/2006  . Unspecified cerebral artery occlusion with cerebral infarction 09/27/2006    Sumner Boast., PT 04/24/2019, 1:55 PM  [place], [name], [address] Phone: [phone]   Fax:  [phone]  Name: William [name] MRN: [id number] Date of Birth: 01/14/1941

## 2019-04-25 ENCOUNTER — Ambulatory Visit (HOSPITAL_COMMUNITY): Payer: PPO

## 2019-04-25 DIAGNOSIS — Z79899 Other long term (current) drug therapy: Secondary | ICD-10-CM

## 2019-04-25 NOTE — Progress Notes (Signed)
Patient came in for labs, tolerated well and without complaint.

## 2019-04-26 ENCOUNTER — Ambulatory Visit: Payer: PPO | Admitting: Physical Therapy

## 2019-04-26 ENCOUNTER — Encounter: Payer: Self-pay | Admitting: Physical Therapy

## 2019-04-26 ENCOUNTER — Other Ambulatory Visit: Payer: Self-pay

## 2019-04-26 DIAGNOSIS — R262 Difficulty in walking, not elsewhere classified: Secondary | ICD-10-CM

## 2019-04-26 DIAGNOSIS — M79605 Pain in left leg: Secondary | ICD-10-CM

## 2019-04-26 DIAGNOSIS — M6281 Muscle weakness (generalized): Secondary | ICD-10-CM

## 2019-04-26 LAB — COMPREHENSIVE METABOLIC PANEL
ALT: 28 IU/L (ref 0–44)
AST: 31 IU/L (ref 0–40)
Albumin/Globulin Ratio: 1.3 (ref 1.2–2.2)
Albumin: 4.2 g/dL (ref 3.7–4.7)
Alkaline Phosphatase: 57 IU/L (ref 39–117)
BUN/Creatinine Ratio: 14 (ref 10–24)
BUN: 30 mg/dL — ABNORMAL HIGH (ref 8–27)
Bilirubin Total: 0.3 mg/dL (ref 0.0–1.2)
CO2: 22 mmol/L (ref 20–29)
Calcium: 10.1 mg/dL (ref 8.6–10.2)
Chloride: 103 mmol/L (ref 96–106)
Creatinine, Ser: 2.13 mg/dL — ABNORMAL HIGH (ref 0.76–1.27)
GFR calc Af Amer: 34 mL/min/{1.73_m2} — ABNORMAL LOW (ref >59)
GFR calc non Af Amer: 29 mL/min/{1.73_m2} — ABNORMAL LOW (ref >59)
Globulin, Total: 3.2 g/dL (ref 1.5–4.5)
Glucose: 127 mg/dL — ABNORMAL HIGH (ref 65–99)
Potassium: 4 mmol/L (ref 3.5–5.2)
Sodium: 142 mmol/L (ref 134–144)
Total Protein: 7.4 g/dL (ref 6.0–8.5)

## 2019-04-26 LAB — CBC WITH DIFFERENTIAL/PLATELET
Basophils Absolute: 0 10*3/uL (ref 0.0–0.2)
Basos: 1 %
EOS (ABSOLUTE): 0.3 10*3/uL (ref 0.0–0.4)
Eos: 5 %
Hematocrit: 38.5 % (ref 37.5–51.0)
Hemoglobin: 12.5 g/dL — ABNORMAL LOW (ref 13.0–17.7)
Immature Grans (Abs): 0 10*3/uL (ref 0.0–0.1)
Immature Granulocytes: 0 %
Lymphocytes Absolute: 2.2 10*3/uL (ref 0.7–3.1)
Lymphs: 30 %
MCH: 30.6 pg (ref 26.6–33.0)
MCHC: 32.5 g/dL (ref 31.5–35.7)
MCV: 94 fL (ref 79–97)
Monocytes Absolute: 0.5 10*3/uL (ref 0.1–0.9)
Monocytes: 7 %
Neutrophils Absolute: 4.2 10*3/uL (ref 1.4–7.0)
Neutrophils: 57 %
Platelets: 252 10*3/uL (ref 150–450)
RBC: 4.08 x10E6/uL — ABNORMAL LOW (ref 4.14–5.80)
RDW: 12.6 % (ref 11.6–15.4)
WBC: 7.3 10*3/uL (ref 3.4–10.8)

## 2019-04-26 LAB — TSH: TSH: 1.22 u[IU]/mL (ref 0.450–4.500)

## 2019-04-26 LAB — CARBAMAZEPINE LEVEL, TOTAL: Carbamazepine (Tegretol), S: 3.6 ug/mL — ABNORMAL LOW (ref 4.0–12.0)

## 2019-04-26 NOTE — Therapy (Signed)
[place], [name], [address] Phone: [phone]   Fax:  [phone]  Physical Therapy Treatment  Patient Details  Name: William [name] MRN: [id number] Date of Birth: [birth year] Referring Provider (PT): Jaffey   Encounter Date: 04/26/2019  PT End of Session - 04/26/19 1351    Visit Number  15    Date for PT Re-Evaluation  04/30/19    PT Start Time  1314    PT Stop Time  1407    PT Time Calculation (min)  53 min    Activity Tolerance  Patient limited by pain    Behavior During Therapy  [facility] for tasks assessed/performed       Past Medical History:  Diagnosis Date  . ANXIETY   . Arthritis    low back  . Bladder stone   . BPH (benign prostatic hypertrophy)   . Carotid artery occlusion   . 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 CVD (cerebrovascular disease)   . Eczema   . GERD (gastroesophageal reflux disease)   . History of carotid artery stenosis    S/P BILATERAL CEA  . 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 Hyperlipidemia   . Hypertension   . Nocturia   . S/P carotid endarterectomy    BILATERAL ICA--  PATENT PER DUPLEX  05-19-2012  . Squamous cell carcinoma    right calf  . Stroke [facility]) 1996   pt states no weaknes but has severe anxiety  . Urinary frequency   . Vitamin D deficiency     Past Surgical History:  Procedure Laterality Date  . APPENDECTOMY  AS CHILD  . CARDIOVASCULAR STRESS TEST  03-27-2012  DR CRENSHAW   LOW RISK LEXISCAN STUDY-- PROBABLE NORMAL PERFUSION AND SOFT TISSUE ATTENUATION/  NO ISCHEMIA/ EF 51%  . CAROTID ENDARTERECTOMY Bilateral LEFT  11-12-2008  DR GREG HAYES   RIGHT ICA  2006  ([facility])  . CYSTOSCOPY WITH LITHOLAPAXY N/A 02/26/2013   Procedure: CYSTOSCOPY WITH LITHOLAPAXY;  Surgeon: Franchot Gallo, MD;  Location: [facility];  Service: Urology;  Laterality: N/A;  . EYE SURGERY  Jan. 2016    cataract surgery both eyes  . INGUINAL HERNIA REPAIR Right 11-08-2006  . MASS EXCISION N/A 03/03/2016   Procedure: EXCISION OF BACK  MASS;  Surgeon: Stark Klein, MD;  Location: [place];  Service: General;  Laterality: N/A;  . 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 PROSTATE SURGERY    . TRANSURETHRAL RESECTION OF PROSTATE N/A 02/26/2013   Procedure: TRANSURETHRAL RESECTION OF THE PROSTATE WITH GYRUS INSTRUMENTS;  Surgeon: Franchot Gallo, MD;  [facility];  Service: Urology;  Laterality: N/A;    There were no vitals filed for this visit.  Subjective Assessment - 04/26/19 1332    Subjective  Patient reports that he had a fall , he was bending over to pick up something and went backward    Currently in Pain?  Yes    Pain Score  6     Pain Location  Hip    Pain Orientation  Left                       [facility] Adult PT Treatment/Exercise - 04/26/19 0001      High Level Balance   High Level Balance Activities  Side stepping;Backward  walking    High Level Balance Comments  picking up items pracvticing this since this is how he fell, reaching for balance      Knee/Hip Exercises: Aerobic   Other Aerobic  UBE level 5 x 5 minutes      Knee/Hip Exercises: Standing   Other Standing Knee Exercises  green  tband rows 2x10      Knee/Hip Exercises: Seated   Ball Squeeze  2x10    Knee/Hip Flexion  on mat table overhead ball toss and catch for core and balance, then bosu behind partial sit ups    Other Seated Knee/Hip Exercises  worked on two things to help get up if he falls, a small dip from mat table to propped up bosu using triceps, then kneel on bosu  with hands on mat table and pushing up    Hamstring Curl  Both;2 sets;10 reps    Hamstring Limitations  green tband       Modalities   Modalities  Electrical Stimulation;Moist Heat      Moist Heat Therapy   Number Minutes Moist Heat  15 Minutes    Moist Heat Location   Hip      Electrical Stimulation   Electrical Stimulation Location  left hip area    Electrical Stimulation Action  IFC    Electrical Stimulation Parameters  sitting    Electrical Stimulation Goals  Pain               PT Short Term Goals - 04/20/19 0920      PT SHORT TERM GOAL #1   Title  independent with initial HEP    Status  Partially Met        PT Long Term Goals - 04/26/19 1353      PT LONG TERM GOAL #1   Title  Understand fall risks, the importance of exercise and flexibility to health and function    Status  Achieved      PT LONG TERM GOAL #3   Title  walk around our building without rest     Status  On-going      PT LONG TERM GOAL #4   Title  decrease pain with walking by 50%    Status  On-going      PT LONG TERM GOAL #5   Title  increase lumbar ROM 25%    Status  On-going            Plan - 04/26/19 1352    Clinical Impression Statement  Patient and his wife report that they forgot to tell me that he fell over the weekend, he bent over to pick something up and fell back onto his buttocks, I did some simulation activities for this today, they reported that they had to call EMS to get him up, so we did some partial simulations of this to help if he is to be in the floor in the future    PT Next Visit Plan  continue to work on function may look at the floor transfers or breaking it down    Consulted and Agree with Plan of Care  Patient       Patient will benefit from skilled therapeutic intervention in order to improve the following deficits and impairments:  Abnormal gait, Decreased cognition, Cardiopulmonary status limiting activity, Decreased activity tolerance, Decreased endurance, Decreased range of motion, Decreased strength, Decreased balance, Difficulty walking, Decreased safety awareness, Improper body mechanics, Decreased mobility  Visit Diagnosis: Difficulty in walking, not  elsewhere classified  Muscle weakness (generalized)  Pain in  left leg     Problem List Patient Active Problem List   Diagnosis Date Noted  . Lumbar spondylosis 05/02/2018  . Black head 04/28/2018  . Pain in left knee 03/09/2018  . 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 Trochanteric bursitis of left hip 01/16/2018  . 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 Preventative health care 09/26/2017  . HTN (hypertension) 07/19/2015  . Hyperlipidemia 07/19/2015  . Closed fracture of fifth metacarpal bone 05/15/2015  . 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  . Great toe pain 02/11/2014  . Dementia with behavioral disturbance ([place]) 01/09/2014  . History of right MCA stroke 11/09/2013  . Obesity (BMI 30-39.9) 06/25/2013  . Renal insufficiency 06/25/2013  . Weakness of left arm 06/25/2013  . Aftercare following surgery of the circulatory system, [place] 05/17/2013  . Confusion 04/17/2012  . Sebaceous cyst 03/03/2011  . LUMBAR SPRAIN AND STRAIN 07/31/2010  . URINARY FREQUENCY 09/25/2009  . RIB PAIN, LEFT SIDED 08/29/2009  . DEPRESSION/ANXIETY 08/01/2009  . CAROTID ARTERY STENOSIS, BILATERAL 05/02/2009  . DIZZINESS 05/02/2009  . HEADACHE 05/02/2009  . ECZEMA, ATOPIC 05/31/2008  . UNSPECIFIED VITAMIN D DEFICIENCY 03/01/2008  . FATIGUE 01/26/2008  . BPH (benign prostatic hyperplasia) 08/06/2007  . ANXIETY 12/21/2006  . HOARSENESS 12/21/2006  . FASTING HYPERGLYCEMIA 12/21/2006  . Hyperlipidemia LDL goal <100 09/27/2006  . Unspecified cerebral artery occlusion with cerebral infarction 09/27/2006    Sumner Boast., PT 04/26/2019, 1:54 PM  [place], [name], [address] Phone: [phone]   Fax:  [phone]  Name: William [name] MRN: [id number] Date of Birth: 06-Nov-1940

## 2019-04-30 MED FILL — TAMSULOSIN HCL 0.4 MG CAP: 0.4 | 90 days supply | Qty: 90 | Fill #1

## 2019-05-01 ENCOUNTER — Other Ambulatory Visit: Payer: Self-pay

## 2019-05-01 ENCOUNTER — Encounter: Payer: Self-pay | Admitting: Physical Therapy

## 2019-05-01 ENCOUNTER — Ambulatory Visit: Payer: PPO | Admitting: Physical Therapy

## 2019-05-01 DIAGNOSIS — M79605 Pain in left leg: Secondary | ICD-10-CM

## 2019-05-01 DIAGNOSIS — K219 Gastro-esophageal reflux disease without esophagitis: Secondary | ICD-10-CM | POA: Diagnosis not present

## 2019-05-01 DIAGNOSIS — R262 Difficulty in walking, not elsewhere classified: Secondary | ICD-10-CM

## 2019-05-01 DIAGNOSIS — W19XXXA Unspecified fall, initial encounter: Secondary | ICD-10-CM | POA: Diagnosis not present

## 2019-05-01 DIAGNOSIS — N400 Enlarged prostate without lower urinary tract symptoms: Secondary | ICD-10-CM | POA: Diagnosis not present

## 2019-05-01 DIAGNOSIS — M6281 Muscle weakness (generalized): Secondary | ICD-10-CM

## 2019-05-01 DIAGNOSIS — I129 Hypertensive chronic kidney disease with stage 1 through stage 4 chronic kidney disease, or unspecified chronic kidney disease: Secondary | ICD-10-CM | POA: Diagnosis not present

## 2019-05-01 DIAGNOSIS — N183 Chronic kidney disease, stage 3 unspecified: Secondary | ICD-10-CM | POA: Diagnosis not present

## 2019-05-01 NOTE — Therapy (Signed)
[place], [name], [address] Phone: [phone]   Fax:  [phone]  Physical Therapy Treatment  Patient Details  Name: William [name] MRN: [id number] Date of Birth: 1941/01/19 Referring Provider (PT): Jaffey   Encounter Date: 05/01/2019  PT End of Session - 05/01/19 1324    Visit Number  16    Date for PT Re-Evaluation  05/31/19    PT Start Time  1311    PT Stop Time  1400    PT Time Calculation (min)  49 min    Activity Tolerance  Patient limited by pain    Behavior During Therapy  [facility] for tasks assessed/performed       Past Medical History:  Diagnosis Date  . ANXIETY   . Arthritis    low back  . Bladder stone   . BPH (benign prostatic hypertrophy)   . Carotid artery occlusion   . 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 CVD (cerebrovascular disease)   . Eczema   . GERD (gastroesophageal reflux disease)   . History of carotid artery stenosis    S/P BILATERAL CEA  . 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 Hyperlipidemia   . Hypertension   . Nocturia   . S/P carotid endarterectomy    BILATERAL ICA--  PATENT PER DUPLEX  05-19-2012  . Squamous cell carcinoma    right calf  . Stroke [facility]) 1996   pt states no weaknes but has severe anxiety  . Urinary frequency   . Vitamin D deficiency     Past Surgical History:  Procedure Laterality Date  . APPENDECTOMY  AS CHILD  . CARDIOVASCULAR STRESS TEST  03-27-2012  DR CRENSHAW   LOW RISK LEXISCAN STUDY-- PROBABLE NORMAL PERFUSION AND SOFT TISSUE ATTENUATION/  NO ISCHEMIA/ EF 51%  . CAROTID ENDARTERECTOMY Bilateral LEFT  11-12-2008  DR GREG HAYES   RIGHT ICA  2006  ([facility])  . CYSTOSCOPY WITH LITHOLAPAXY N/A 02/26/2013   Procedure: CYSTOSCOPY WITH LITHOLAPAXY;  Surgeon: Franchot Gallo, MD;  Location: [facility];  Service: Urology;  Laterality: N/A;  . EYE SURGERY  Jan. 2016    cataract surgery both eyes  . INGUINAL HERNIA REPAIR Right 11-08-2006  . MASS EXCISION N/A 03/03/2016   Procedure: EXCISION OF BACK  MASS;  Surgeon: Stark Klein, MD;  Location: [place];  Service: General;  Laterality: N/A;  . 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 PROSTATE SURGERY    . TRANSURETHRAL RESECTION OF PROSTATE N/A 02/26/2013   Procedure: TRANSURETHRAL RESECTION OF THE PROSTATE WITH GYRUS INSTRUMENTS;  Surgeon: Franchot Gallo, MD;  Location: [facility];  Service: Urology;  Laterality: N/A;    There were no vitals filed for this visit.  Subjective Assessment - 05/01/19 1318    Subjective  Patient reports that he is doing a little better, but reports the right hip is still hurting, he will be seeing Dr. Nelva Bush about the hip tommorow    Currently in Pain?  Yes    Pain Score  4     Pain Location  Hip    Pain Orientation  Left    Pain Relieving Factors  the treatment seems to have helped                       [facility] Adult PT  Treatment/Exercise - 05/01/19 0001      High Level Balance   High Level Balance Activities  Side stepping;Backward walking    High Level Balance Comments  in hall with walls for support if needed use of a hockey stick and shooting, soccer kicks      Knee/Hip Exercises: Stretches   Passive Hamstring Stretch  Left;5 reps;10 seconds      Knee/Hip Exercises: Aerobic   Other Aerobic  UBE level 5 x 5 minutes      Knee/Hip Exercises: Machines for Strengthening   Other Machine  seated rows 20#, lats 20# 2x10, chest press 10# 2x10      Knee/Hip Exercises: Seated   Long Arc Quad  Both;Weights;2 sets;15 reps;10 reps    Long Arc Quad Weight  3 lbs.    Ball Squeeze  2x10    Knee/Hip Flexion  on mat table overhead ball toss and catch for core and balance, then bosu behind partial sit ups    Other Seated Knee/Hip Exercises  worked on two things to help get up if he falls, a small dip from mat table  to propped up bosu using triceps, then kneel on bosu  with hands on mat table and pushing up, ball in lap isometric abs    Marching  Both;2 sets;10 reps    Marching Weights  3 lbs.    Hamstring Curl  Both;2 sets;10 reps    Hamstring Limitations  green tband       Modalities   Modalities  Electrical Stimulation;Moist Heat      Moist Heat Therapy   Number Minutes Moist Heat  15 Minutes    Moist Heat Location  Hip      Electrical Stimulation   Electrical Stimulation Location  left hip area    Electrical Stimulation Action  IFC    Electrical Stimulation Parameters  sitting    Electrical Stimulation Goals  Pain               PT Short Term Goals - 04/20/19 0920      PT SHORT TERM GOAL #1   Title  independent with initial HEP    Status  Partially Met        PT Long Term Goals - 05/01/19 1350      PT LONG TERM GOAL #2   Title  decrease TUG time to 16 seconds    Status  Achieved      PT LONG TERM GOAL #3   Title  walk around our building without rest     Status  On-going      PT LONG TERM GOAL #4   Title  decrease pain with walking by 50%    Status  On-going            Plan - 05/01/19 1348    Clinical Impression Statement  Patient still moving very slowly, he is reporting pain in the left hip, he will be seeing MD about this tomorrow.  He started having some LBP with the hockey shots with him bending forward.  With the soccer kicks he was c/o unsteadiness.    PT Next Visit Plan  continue to work on function may look at the floor transfers or breaking it down, see if he gets injection tomorrow    Consulted and Agree with Plan of Care  Patient       Patient will benefit from skilled therapeutic intervention in order to improve the following deficits and impairments:  Abnormal gait,  Decreased cognition, Cardiopulmonary status limiting activity, Decreased activity tolerance, Decreased endurance, Decreased range of motion, Decreased strength, Decreased balance,  Difficulty walking, Decreased safety awareness, Improper body mechanics, Decreased mobility  Visit Diagnosis: Difficulty in walking, not elsewhere classified  Muscle weakness (generalized)  Pain in left leg     Problem List Patient Active Problem List   Diagnosis Date Noted  . Lumbar spondylosis 05/02/2018  . Black head 04/28/2018  . Pain in left knee 03/09/2018  . 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 Trochanteric bursitis of left hip 01/16/2018  . 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 Preventative health care 09/26/2017  . HTN (hypertension) 07/19/2015  . Hyperlipidemia 07/19/2015  . Closed fracture of fifth metacarpal bone 05/15/2015  . 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  . Great toe pain 02/11/2014  . Dementia with behavioral disturbance ([place]) 01/09/2014  . History of right MCA stroke 11/09/2013  . Obesity (BMI 30-39.9) 06/25/2013  . Renal insufficiency 06/25/2013  . Weakness of left arm 06/25/2013  . Aftercare following surgery of the circulatory system, [place] 05/17/2013  . Confusion 04/17/2012  . Sebaceous cyst 03/03/2011  . LUMBAR SPRAIN AND STRAIN 07/31/2010  . URINARY FREQUENCY 09/25/2009  . RIB PAIN, LEFT SIDED 08/29/2009  . DEPRESSION/ANXIETY 08/01/2009  . CAROTID ARTERY STENOSIS, BILATERAL 05/02/2009  . DIZZINESS 05/02/2009  . HEADACHE 05/02/2009  . ECZEMA, ATOPIC 05/31/2008  . UNSPECIFIED VITAMIN D DEFICIENCY 03/01/2008  . FATIGUE 01/26/2008  . BPH (benign prostatic hyperplasia) 08/06/2007  . ANXIETY 12/21/2006  . HOARSENESS 12/21/2006  . FASTING HYPERGLYCEMIA 12/21/2006  . Hyperlipidemia LDL goal <100 09/27/2006  . Unspecified cerebral artery occlusion with cerebral infarction 09/27/2006    Sumner Boast., PT 05/01/2019, 1:52 PM  [address] [place], [name], [address] Phone: [phone]   Fax:  [phone]  Name:  William [name] MRN: [id number] Date of Birth: [birth year]

## 2019-05-02 DIAGNOSIS — M47816 Spondylosis without myelopathy or radiculopathy, lumbar region: Secondary | ICD-10-CM | POA: Diagnosis not present

## 2019-05-02 DIAGNOSIS — M25552 Pain in left hip: Secondary | ICD-10-CM | POA: Diagnosis not present

## 2019-05-03 ENCOUNTER — Encounter: Payer: Self-pay | Admitting: Podiatry

## 2019-05-03 ENCOUNTER — Encounter: Payer: Self-pay | Admitting: Physical Therapy

## 2019-05-03 ENCOUNTER — Ambulatory Visit: Payer: PPO | Admitting: Physical Therapy

## 2019-05-03 ENCOUNTER — Encounter: Payer: PPO | Admitting: Physical Therapy

## 2019-05-03 ENCOUNTER — Other Ambulatory Visit: Payer: Self-pay

## 2019-05-03 ENCOUNTER — Ambulatory Visit: Payer: PPO | Admitting: Podiatry

## 2019-05-03 DIAGNOSIS — B351 Tinea unguium: Secondary | ICD-10-CM

## 2019-05-03 DIAGNOSIS — M79676 Pain in unspecified toe(s): Secondary | ICD-10-CM

## 2019-05-03 DIAGNOSIS — R262 Difficulty in walking, not elsewhere classified: Secondary | ICD-10-CM

## 2019-05-03 DIAGNOSIS — M79605 Pain in left leg: Secondary | ICD-10-CM

## 2019-05-03 DIAGNOSIS — M6281 Muscle weakness (generalized): Secondary | ICD-10-CM

## 2019-05-03 NOTE — Therapy (Signed)
[place], [name], [address] Phone: [phone]   Fax:  [phone]  Physical Therapy Treatment  Patient Details  Name: William [name] MRN: [id number] Date of Birth: [birth year] Referring Provider (PT): Jaffey   Encounter Date: 05/03/2019  PT End of Session - 05/03/19 1553    Visit Number  17    Date for PT Re-Evaluation  05/31/19    PT Start Time  1524    PT Stop Time  1613    PT Time Calculation (min)  49 min    Activity Tolerance  Patient limited by pain    Behavior During Therapy  [facility] for tasks assessed/performed       Past Medical History:  Diagnosis Date  . ANXIETY   . Arthritis    low back  . Bladder stone   . BPH (benign prostatic hypertrophy)   . Carotid artery occlusion   . 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 CVD (cerebrovascular disease)   . Eczema   . GERD (gastroesophageal reflux disease)   . History of carotid artery stenosis    S/P BILATERAL CEA  . 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 Hyperlipidemia   . Hypertension   . Nocturia   . S/P carotid endarterectomy    BILATERAL ICA--  PATENT PER DUPLEX  05-19-2012  . Squamous cell carcinoma    right calf  . Stroke [facility]) 1996   pt states no weaknes but has severe anxiety  . Urinary frequency   . Vitamin D deficiency     Past Surgical History:  Procedure Laterality Date  . APPENDECTOMY  AS CHILD  . CARDIOVASCULAR STRESS TEST  03-27-2012  DR CRENSHAW   LOW RISK LEXISCAN STUDY-- PROBABLE NORMAL PERFUSION AND SOFT TISSUE ATTENUATION/  NO ISCHEMIA/ EF 51%  . CAROTID ENDARTERECTOMY Bilateral LEFT  11-12-2008  DR GREG HAYES   RIGHT ICA  2006  ([facility])  . CYSTOSCOPY WITH LITHOLAPAXY N/A 02/26/2013   Procedure: CYSTOSCOPY WITH LITHOLAPAXY;  Surgeon: Franchot Gallo, MD;  Location: [facility];  Service: Urology;  Laterality: N/A;  . EYE SURGERY  Jan. 2016    cataract surgery both eyes  . INGUINAL HERNIA REPAIR Right 11-08-2006  . MASS EXCISION N/A 03/03/2016   Procedure: EXCISION OF BACK  MASS;  Surgeon: Stark Klein, MD;  Location: [place];  Service: General;  Laterality: N/A;  . 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 PROSTATE SURGERY    . TRANSURETHRAL RESECTION OF PROSTATE N/A 02/26/2013   Procedure: TRANSURETHRAL RESECTION OF THE PROSTATE WITH GYRUS INSTRUMENTS;  Surgeon: Franchot Gallo, MD;  Location: [facility];  Service: Urology;  Laterality: N/A;    There were no vitals filed for this visit.  Subjective Assessment - 05/03/19 1530    Subjective  Patient reports that he is very tired, he reports that he went to the MD twice in the past two days    Currently in Pain?  Yes    Pain Score  4     Pain Location  Hip    Pain Orientation  Left    Aggravating Factors   standing                       [facility] Adult PT Treatment/Exercise - 05/03/19 0001      Knee/Hip Exercises:  Stretches   Piriformis Stretch  Both;3 reps;20 seconds      Knee/Hip Exercises: Aerobic   Other Aerobic  UBE level 5 x 5 minutes      Knee/Hip Exercises: Machines for Strengthening   Cybex Knee Extension  5lb 2x15    Cybex Knee Flexion  25lb 2x15    Other Machine  seated rows 20#, lats 20# 2x10, chest press 10# 2x10      Knee/Hip Exercises: Standing   Hip Flexion  Both;2 sets;10 reps    Hip Flexion Limitations  2#      Knee/Hip Exercises: Seated   Ball Squeeze  2x10    Knee/Hip Flexion  on mat table overhead ball toss and catch for core and balance, then bosu behind partial sit ups      Knee/Hip Exercises: Supine   Bridges with Ball Squeeze  15 reps    Other Supine Knee/Hip Exercises  feet on ball K2c, trunk rotation, bridges and isometric abs      Modalities   Modalities  Electrical Stimulation;Moist Heat      Moist Heat Therapy   Number Minutes Moist Heat  15 Minutes    Moist Heat  Location  Hip      Electrical Stimulation   Electrical Stimulation Location  left hip low back area    Electrical Stimulation Action  IFC    Electrical Stimulation Parameters  sitting    Electrical Stimulation Goals  Pain               PT Short Term Goals - 04/20/19 0920      PT SHORT TERM GOAL #1   Title  independent with initial HEP    Status  Partially Met        PT Long Term Goals - 05/03/19 1556      PT LONG TERM GOAL #1   Title  Understand fall risks, the importance of exercise and flexibility to health and function    Status  Achieved      PT LONG TERM GOAL #2   Title  decrease TUG time to 16 seconds    Status  Achieved      PT LONG TERM GOAL #3   Title  walk around our building without rest     Status  On-going      PT LONG TERM GOAL #4   Title  decrease pain with walking by 50%    Status  On-going            Plan - 05/03/19 1554    Clinical Impression Statement  Patient saw MD today, he will be having an injection in the low back/hip in the near future, I asked him to ask the MD about holding PT for a few days if needed after the injection, at this time they do not know when the injection will happen.  He seems to still suffer some with the standing.  He requires constant cues to go slow, get good ROM especially with the left extremities.    PT Next Visit Plan  will see if and when he gets the injection and progress accordingly    Consulted and Agree with Plan of Care  Patient       Patient will benefit from skilled therapeutic intervention in order to improve the following deficits and impairments:  Abnormal gait, Decreased cognition, Cardiopulmonary status limiting activity, Decreased activity tolerance, Decreased endurance, Decreased range of motion, Decreased strength, Decreased balance, Difficulty walking, Decreased safety awareness, Improper  body mechanics, Decreased mobility  Visit Diagnosis: Difficulty in walking, not elsewhere  classified  Muscle weakness (generalized)  Pain in left leg     Problem List Patient Active Problem List   Diagnosis Date Noted  . Lumbar spondylosis 05/02/2018  . Black head 04/28/2018  . Pain in left knee 03/09/2018  . 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 Trochanteric bursitis of left hip 01/16/2018  . 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 Preventative health care 09/26/2017  . HTN (hypertension) 07/19/2015  . Hyperlipidemia 07/19/2015  . Closed fracture of fifth metacarpal bone 05/15/2015  . 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  . Great toe pain 02/11/2014  . Dementia with behavioral disturbance ([place]) 01/09/2014  . History of right MCA stroke 11/09/2013  . Obesity (BMI 30-39.9) 06/25/2013  . Renal insufficiency 06/25/2013  . Weakness of left arm 06/25/2013  . Aftercare following surgery of the circulatory system, [place] 05/17/2013  . Confusion 04/17/2012  . Sebaceous cyst 03/03/2011  . LUMBAR SPRAIN AND STRAIN 07/31/2010  . URINARY FREQUENCY 09/25/2009  . RIB PAIN, LEFT SIDED 08/29/2009  . DEPRESSION/ANXIETY 08/01/2009  . CAROTID ARTERY STENOSIS, BILATERAL 05/02/2009  . DIZZINESS 05/02/2009  . HEADACHE 05/02/2009  . ECZEMA, ATOPIC 05/31/2008  . UNSPECIFIED VITAMIN D DEFICIENCY 03/01/2008  . FATIGUE 01/26/2008  . BPH (benign prostatic hyperplasia) 08/06/2007  . ANXIETY 12/21/2006  . HOARSENESS 12/21/2006  . FASTING HYPERGLYCEMIA 12/21/2006  . Hyperlipidemia LDL goal <100 09/27/2006  . Unspecified cerebral artery occlusion with cerebral infarction 09/27/2006    Sumner Boast., PT 05/03/2019, 3:57 PM  [place], [name], [address] Phone: [phone]   Fax:  [phone]  Name: William [name] MRN: [id number] Date of Birth: [birth year]

## 2019-05-09 NOTE — Progress Notes (Signed)
Subjective: 78 y.o. returns the office today for painful, elongated, thickened toenails which he cannot trim himself. Denies any redness or drainage around the nails.  Denies any systemic complaints such as fevers, chills, nausea, vomiting.   PCP: Carollee Herter, Alferd Apa, DO  Objective: AAO 3, NAD, presents with his wife DP/PT pulses palpable, CRT less than 3 seconds  Nails hypertrophic, dystrophic, elongated, brittle, discolored x 10. There is tenderness overlying the nails 1-5 bilaterally. There is no surrounding erythema or drainage along the nail sites. No open lesions or pre-ulcerative lesions are identified. No pain with calf compression, swelling, warmth, erythema.  Assessment: Patient presents with symptomatic onychomycosis  Plan: -Treatment options including alternatives, risks, complications were discussed -Nails debrided x 10 without any complications or bleeding -Follow-up in 3 months or sooner if any problems are to arise. In the meantime, encouraged to call the office with any questions, concerns, changes symptoms.  Celesta Gentile, DPM

## 2019-05-15 ENCOUNTER — Encounter: Payer: Self-pay | Admitting: Physical Therapy

## 2019-05-15 ENCOUNTER — Ambulatory Visit: Payer: PPO | Attending: Neurology | Admitting: Physical Therapy

## 2019-05-15 ENCOUNTER — Other Ambulatory Visit: Payer: Self-pay

## 2019-05-15 DIAGNOSIS — R262 Difficulty in walking, not elsewhere classified: Secondary | ICD-10-CM | POA: Diagnosis not present

## 2019-05-15 DIAGNOSIS — M6281 Muscle weakness (generalized): Secondary | ICD-10-CM | POA: Diagnosis not present

## 2019-05-15 DIAGNOSIS — M79605 Pain in left leg: Secondary | ICD-10-CM | POA: Diagnosis not present

## 2019-05-15 NOTE — Therapy (Signed)
[place], [name], [address] Phone: [phone]   Fax:  [phone]  Physical Therapy Treatment  Patient Details  Name: William [name] MRN: [id number] Date of Birth: 1941-01-31 Referring Provider (PT): Jaffey   Encounter Date: 05/15/2019  PT End of Session - 05/15/19 1142    Visit Number  18    Date for PT Re-Evaluation  05/31/19    PT Start Time  1057    PT Stop Time  1147    PT Time Calculation (min)  50 min    Activity Tolerance  Patient limited by pain       Past Medical History:  Diagnosis Date  . ANXIETY   . Arthritis    low back  . Bladder stone   . BPH (benign prostatic hypertrophy)   . Carotid artery occlusion   . 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 CVD (cerebrovascular disease)   . Eczema   . GERD (gastroesophageal reflux disease)   . History of carotid artery stenosis    S/P BILATERAL CEA  . 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 Hyperlipidemia   . Hypertension   . Nocturia   . S/P carotid endarterectomy    BILATERAL ICA--  PATENT PER DUPLEX  05-19-2012  . Squamous cell carcinoma    right calf  . Stroke [facility]) 1996   pt states no weaknes but has severe anxiety  . Urinary frequency   . Vitamin D deficiency     Past Surgical History:  Procedure Laterality Date  . APPENDECTOMY  AS CHILD  . CARDIOVASCULAR STRESS TEST  03-27-2012  DR CRENSHAW   LOW RISK LEXISCAN STUDY-- PROBABLE NORMAL PERFUSION AND SOFT TISSUE ATTENUATION/  NO ISCHEMIA/ EF 51%  . CAROTID ENDARTERECTOMY Bilateral LEFT  11-12-2008  DR GREG HAYES   RIGHT ICA  2006  ([facility])  . CYSTOSCOPY WITH LITHOLAPAXY N/A 02/26/2013   Procedure: CYSTOSCOPY WITH LITHOLAPAXY;  Surgeon: Franchot Gallo, MD;  Location: [facility];  Service: Urology;  Laterality: N/A;  . EYE SURGERY  Jan. 2016   cataract surgery both eyes  . INGUINAL HERNIA REPAIR Right  11-08-2006  . MASS EXCISION N/A 03/03/2016   Procedure: EXCISION OF BACK  MASS;  Surgeon: Stark Klein, MD;  Location: [place];  Service: General;  Laterality: N/A;  . 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 PROSTATE SURGERY    . TRANSURETHRAL RESECTION OF PROSTATE N/A 02/26/2013   Procedure: TRANSURETHRAL RESECTION OF THE PROSTATE WITH GYRUS INSTRUMENTS;  Surgeon: Franchot Gallo, MD;  Location: [facility];  Service: Urology;  Laterality: N/A;    There were no vitals filed for this visit.  Subjective Assessment - 05/15/19 1101    Subjective  Patient will be having an injection in his hip and back tomorrow    Currently in Pain?  Yes    Pain Score  4     Pain Location  Hip    Pain Orientation  Left    Aggravating Factors   standing and walking                       [facility] Adult PT Treatment/Exercise - 05/15/19 0001      High Level Balance   High Level Balance Activities  Side stepping;Backward walking    High Level Balance Comments  ball tossing, ball kicking      Knee/Hip Exercises: Stretches   Passive Hamstring Stretch  Right;Left;4 reps;20 seconds    Piriformis Stretch  Both;4 reps;20 seconds      Knee/Hip Exercises: Aerobic   Other Aerobic  UBE level 5 x 6 minutes      Knee/Hip Exercises: Machines for Strengthening   Other Machine  seated rows 20#, lats 20# 2x10, chest press 10# 2x10      Knee/Hip Exercises: Seated   Ball Squeeze  2x10    Knee/Hip Flexion  on mat table overhead ball toss and catch for core and balance, then bosu behind partial sit ups      Knee/Hip Exercises: Supine   Other Supine Knee/Hip Exercises  feet on ball K2c, trunk rotation, bridges and isometric abs      Modalities   Modalities  Electrical Stimulation;Moist Heat      Moist Heat Therapy   Number Minutes Moist Heat  15 Minutes    Moist Heat Location  Lumbar Spine      Electrical Stimulation   Electrical Stimulation Location   lumbar area    Electrical Stimulation Action  IFC    Electrical Stimulation Parameters  sitting    Electrical Stimulation Goals  Pain               PT Short Term Goals - 04/20/19 0920      PT SHORT TERM GOAL #1   Title  independent with initial HEP    Status  Partially Met        PT Long Term Goals - 05/15/19 1153      PT LONG TERM GOAL #1   Title  Understand fall risks, the importance of exercise and flexibility to health and function    Status  Achieved      PT LONG TERM GOAL #2   Title  decrease TUG time to 16 seconds    Status  Achieved      PT LONG TERM GOAL #3   Title  walk around our building without rest     Status  On-going            Plan - 05/15/19 1148    Clinical Impression Statement  Patient continues to have the low back and hip pain and has difficulty with standing and LE exercises, he will be having an injection this week, so we will wait and see him next week.  He is very tight in both Hs and piriformis mms and really does not like the stretches.  I encouraged him to do at home but I may need to speak with his wife on doing this.  He continues to have some issues with balance, today with the ball tossing he almost went forward but took a step to right himself    PT Next Visit Plan  see how th injection goes and then progress as tolerated    Consulted and Agree with Plan of Care  Patient       Patient will benefit from skilled therapeutic intervention in order to improve the following deficits and impairments:  Abnormal gait, Decreased cognition, Cardiopulmonary status limiting activity, Decreased activity tolerance, Decreased endurance, Decreased range of motion, Decreased strength, Decreased balance, Difficulty walking, Decreased safety awareness, Improper body mechanics, Decreased mobility  Visit Diagnosis: Difficulty in walking, not elsewhere classified  Muscle weakness (generalized)  Pain in left leg     Problem List Patient  Active Problem List   Diagnosis Date Noted  .  Lumbar spondylosis 05/02/2018  . Black head 04/28/2018  . Pain in left knee 03/09/2018  . 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 Trochanteric bursitis of left hip 01/16/2018  . 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 Preventative health care 09/26/2017  . HTN (hypertension) 07/19/2015  . Hyperlipidemia 07/19/2015  . Closed fracture of fifth metacarpal bone 05/15/2015  . 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  . Great toe pain 02/11/2014  . Dementia with behavioral disturbance ([place]) 01/09/2014  . History of right MCA stroke 11/09/2013  . Obesity (BMI 30-39.9) 06/25/2013  . Renal insufficiency 06/25/2013  . Weakness of left arm 06/25/2013  . Aftercare following surgery of the circulatory system, [place] 05/17/2013  . Confusion 04/17/2012  . Sebaceous cyst 03/03/2011  . LUMBAR SPRAIN AND STRAIN 07/31/2010  . URINARY FREQUENCY 09/25/2009  . RIB PAIN, LEFT SIDED 08/29/2009  . DEPRESSION/ANXIETY 08/01/2009  . CAROTID ARTERY STENOSIS, BILATERAL 05/02/2009  . DIZZINESS 05/02/2009  . HEADACHE 05/02/2009  . ECZEMA, ATOPIC 05/31/2008  . UNSPECIFIED VITAMIN D DEFICIENCY 03/01/2008  . FATIGUE 01/26/2008  . BPH (benign prostatic hyperplasia) 08/06/2007  . ANXIETY 12/21/2006  . HOARSENESS 12/21/2006  . FASTING HYPERGLYCEMIA 12/21/2006  . Hyperlipidemia LDL goal <100 09/27/2006  . Unspecified cerebral artery occlusion with cerebral infarction 09/27/2006    Sumner Boast., PT 05/15/2019, 11:54 AM  [place], [name], [address] Phone: [phone]   Fax:  [phone]  Name: William [name] MRN: [id number] Date of Birth: September 20, 1940

## 2019-05-16 DIAGNOSIS — M1612 Unilateral primary osteoarthritis, left hip: Secondary | ICD-10-CM | POA: Diagnosis not present

## 2019-05-17 ENCOUNTER — Ambulatory Visit: Payer: PPO | Admitting: Physical Therapy

## 2019-05-17 MED FILL — LISINOPRIL-HCTZ 10-12.5 MG: 10-12.5 | 30 days supply | Qty: 30 | Fill #1

## 2019-05-21 MED FILL — AMLODIPINE BESYLATE 5 MG TA: 5 | 90 days supply | Qty: 90 | Fill #1

## 2019-05-21 MED FILL — LORazepam 0.5 MG TABS: 0.5 | 30 days supply | Qty: 150 | Fill #2

## 2019-05-22 ENCOUNTER — Other Ambulatory Visit: Payer: Self-pay

## 2019-05-22 ENCOUNTER — Encounter: Payer: Self-pay | Admitting: Physical Therapy

## 2019-05-22 ENCOUNTER — Ambulatory Visit: Payer: PPO | Admitting: Physical Therapy

## 2019-05-22 DIAGNOSIS — M6281 Muscle weakness (generalized): Secondary | ICD-10-CM

## 2019-05-22 DIAGNOSIS — M79605 Pain in left leg: Secondary | ICD-10-CM

## 2019-05-22 DIAGNOSIS — R262 Difficulty in walking, not elsewhere classified: Secondary | ICD-10-CM

## 2019-05-22 NOTE — Therapy (Signed)
[facility] [place], [name], [address] Phone: [phone]   Fax:  [phone]  Physical Therapy Treatment  Patient Details  Name: William [name] MRN: [id number] Date of Birth: [birth year] Referring Provider (PT): Jaffey   Encounter Date: 05/22/2019  PT End of Session - 05/22/19 1135    Visit Number  19    Date for PT Re-Evaluation  05/31/19    PT Start Time  1059    PT Stop Time  1147    PT Time Calculation (min)  48 min    Activity Tolerance  Patient tolerated treatment well    Behavior During Therapy  [facility] for tasks assessed/performed       Past Medical History:  Diagnosis Date  . ANXIETY   . Arthritis    low back  . Bladder stone   . BPH (benign prostatic hypertrophy)   . Carotid artery occlusion   . 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 CVD (cerebrovascular disease)   . Eczema   . GERD (gastroesophageal reflux disease)   . History of carotid artery stenosis    S/P BILATERAL CEA  . 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 Hyperlipidemia   . Hypertension   . Nocturia   . S/P carotid endarterectomy    BILATERAL ICA--  PATENT PER DUPLEX  05-19-2012  . Squamous cell carcinoma    right calf  . Stroke [facility]) 1996   pt states no weaknes but has severe anxiety  . Urinary frequency   . Vitamin D deficiency     Past Surgical History:  Procedure Laterality Date  . APPENDECTOMY  AS CHILD  . CARDIOVASCULAR STRESS TEST  03-27-2012  DR CRENSHAW   LOW RISK LEXISCAN STUDY-- PROBABLE NORMAL PERFUSION AND SOFT TISSUE ATTENUATION/  NO ISCHEMIA/ EF 51%  . CAROTID ENDARTERECTOMY Bilateral LEFT  11-12-2008  DR GREG HAYES   RIGHT ICA  2006  ([facility])  . CYSTOSCOPY WITH LITHOLAPAXY N/A 02/26/2013   Procedure: CYSTOSCOPY WITH LITHOLAPAXY;  Surgeon: Franchot Gallo, MD;  Location: [facility];  Service: Urology;  Laterality: N/A;  . EYE SURGERY   Jan. 2016   cataract surgery both eyes  . INGUINAL HERNIA REPAIR Right 11-08-2006  . MASS EXCISION N/A 03/03/2016   Procedure: EXCISION OF BACK  MASS;  Surgeon: Stark Klein, MD;  Location: [place];  Service: General;  Laterality: N/A;  . 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 PROSTATE SURGERY    . TRANSURETHRAL RESECTION OF PROSTATE N/A 02/26/2013   Procedure: TRANSURETHRAL RESECTION OF THE PROSTATE WITH GYRUS INSTRUMENTS;  Surgeon: Franchot Gallo, MD;  Location[facility];  Service: Urology;  Laterality: N/A;    There were no vitals filed for this visit.  Subjective Assessment - 05/22/19 1131    Subjective  Patient reports that his hip and back is feeling a little better, less pain, but still fatigued and some pain with standing and walking    Currently in Pain?  Yes    Pain Score  3     Pain Location  Hip    Pain Orientation  Left    Pain Descriptors / Indicators  Aching    Aggravating Factors   stadning and walking  [facility] Adult PT Treatment/Exercise - 05/22/19 0001      Knee/Hip Exercises: Aerobic   Other Aerobic  UBE level 5 x 6 minutes      Knee/Hip Exercises: Machines for Strengthening   Other Machine  seated rows 20#, lats 20# 2x10, chest press 10# 2x10, 5# on the pulleys straight arm pulls, cues for posture and to engage the core      Knee/Hip Exercises: Seated   Ball Squeeze  2x10    Marching  Both;2 sets;10 reps    Marching Weights  3 lbs.    Hamstring Curl  Both;2 sets;10 reps    Hamstring Limitations  green tband       Knee/Hip Exercises: Supine   Other Supine Knee/Hip Exercises  feet on ball K2c, trunk rotation, bridges and isometric abs      Modalities   Modalities  Electrical Stimulation;Moist Heat      Moist Heat Therapy   Number Minutes Moist Heat  15 Minutes    Moist Heat Location  Lumbar Spine      Electrical Stimulation   Electrical Stimulation Location  lumbar  area    Electrical Stimulation Action  IFC    Electrical Stimulation Parameters  sitting    Electrical Stimulation Goals  Pain               PT Short Term Goals - 04/20/19 0920      PT SHORT TERM GOAL #1   Title  independent with initial HEP    Status  Partially Met        PT Long Term Goals - 05/22/19 1137      PT LONG TERM GOAL #1   Title  Understand fall risks, the importance of exercise and flexibility to health and function    Status  Achieved      PT LONG TERM GOAL #2   Title  decrease TUG time to 16 seconds    Status  Achieved      PT LONG TERM GOAL #3   Title  walk around our building without rest     Status  On-going      PT LONG TERM GOAL #4   Title  decrease pain with walking by 50%    Status  On-going            Plan - 05/22/19 1135    Clinical Impression Statement  I tried to continue to be easy with the exercises today as last week he had the injection and he is doing well, would like to ease back into the exercises and not increase his pain.  He does need a lot of cues to work the left UE as he demonstrates a lot of neglect.  Also cues for posture and core activation    PT Next Visit Plan  slowly progress as long as his pain is less, try to get him walking and stronger in the core    Consulted and Agree with Plan of Care  Patient       Patient will benefit from skilled therapeutic intervention in order to improve the following deficits and impairments:  Abnormal gait, Decreased cognition, Cardiopulmonary status limiting activity, Decreased activity tolerance, Decreased endurance, Decreased range of motion, Decreased strength, Decreased balance, Difficulty walking, Decreased safety awareness, Improper body mechanics, Decreased mobility  Visit Diagnosis: Difficulty in walking, not elsewhere classified  Muscle weakness (generalized)  Pain in left leg     Problem List Patient Active Problem List   Diagnosis  Date Noted  . Lumbar  spondylosis 05/02/2018  . Black head 04/28/2018  . Pain in left knee 03/09/2018  . 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 Trochanteric bursitis of left hip 01/16/2018  . 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 Preventative health care 09/26/2017  . HTN (hypertension) 07/19/2015  . Hyperlipidemia 07/19/2015  . Closed fracture of fifth metacarpal bone 05/15/2015  . 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  . Great toe pain 02/11/2014  . Dementia with behavioral disturbance ([place]) 01/09/2014  . History of right MCA stroke 11/09/2013  . Obesity (BMI 30-39.9) 06/25/2013  . Renal insufficiency 06/25/2013  . Weakness of left arm 06/25/2013  . Aftercare following surgery of the circulatory system, [place] 05/17/2013  . Confusion 04/17/2012  . Sebaceous cyst 03/03/2011  . LUMBAR SPRAIN AND STRAIN 07/31/2010  . URINARY FREQUENCY 09/25/2009  . RIB PAIN, LEFT SIDED 08/29/2009  . DEPRESSION/ANXIETY 08/01/2009  . CAROTID ARTERY STENOSIS, BILATERAL 05/02/2009  . DIZZINESS 05/02/2009  . HEADACHE 05/02/2009  . ECZEMA, ATOPIC 05/31/2008  . UNSPECIFIED VITAMIN D DEFICIENCY 03/01/2008  . FATIGUE 01/26/2008  . BPH (benign prostatic hyperplasia) 08/06/2007  . ANXIETY 12/21/2006  . HOARSENESS 12/21/2006  . FASTING HYPERGLYCEMIA 12/21/2006  . Hyperlipidemia LDL goal <100 09/27/2006  . Unspecified cerebral artery occlusion with cerebral infarction 09/27/2006    Sumner Boast., PT 05/22/2019, 11:38 AM  [place], [name], [address] Phone: [phone]   Fax:  [phone]  Name: MARISA HUFSTETLER MRN: [id number] Date of Birth: 1940/10/18

## 2019-05-24 ENCOUNTER — Ambulatory Visit: Payer: PPO | Admitting: Physical Therapy

## 2019-05-24 ENCOUNTER — Encounter: Payer: Self-pay | Admitting: Physical Therapy

## 2019-05-24 ENCOUNTER — Other Ambulatory Visit: Payer: Self-pay

## 2019-05-24 DIAGNOSIS — R262 Difficulty in walking, not elsewhere classified: Secondary | ICD-10-CM | POA: Diagnosis not present

## 2019-05-24 DIAGNOSIS — M79605 Pain in left leg: Secondary | ICD-10-CM

## 2019-05-24 DIAGNOSIS — M6281 Muscle weakness (generalized): Secondary | ICD-10-CM

## 2019-05-24 NOTE — Therapy (Signed)
[address] [place], [name], [address] Phone: [phone]   Fax:  [phone] Progress Note Reporting Period 04/03/19 to 05/24/19 for visits 11-20  See note below for Objective Data and Assessment of Progress/Goals.      Physical Therapy Treatment  Patient Details  Name: William [name] MRN: [id number] Date of Birth: [birth year] Referring Provider (PT): Jaffey   Encounter Date: 05/24/2019  PT End of Session - 05/24/19 1351    Visit Number  20    Date for PT Re-Evaluation  05/31/19    PT Start Time  1315    PT Stop Time  1402    PT Time Calculation (min)  47 min    Activity Tolerance  Patient tolerated treatment well    Behavior During Therapy  [facility] for tasks assessed/performed       Past Medical History:  Diagnosis Date  . ANXIETY   . Arthritis    low back  . Bladder stone   . BPH (benign prostatic hypertrophy)   . Carotid artery occlusion   . 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 CVD (cerebrovascular disease)   . Eczema   . GERD (gastroesophageal reflux disease)   . History of carotid artery stenosis    S/P BILATERAL CEA  . 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 Hyperlipidemia   . Hypertension   . Nocturia   . S/P carotid endarterectomy    BILATERAL ICA--  PATENT PER DUPLEX  05-19-2012  . Squamous cell carcinoma    right calf  . Stroke [facility]) 1996   pt states no weaknes but has severe anxiety  . Urinary frequency   . Vitamin D deficiency     Past Surgical History:  Procedure Laterality Date  . APPENDECTOMY  AS CHILD  . CARDIOVASCULAR STRESS TEST  03-27-2012  DR CRENSHAW   LOW RISK LEXISCAN STUDY-- PROBABLE NORMAL PERFUSION AND SOFT TISSUE ATTENUATION/  NO ISCHEMIA/ EF 51%  . CAROTID ENDARTERECTOMY Bilateral LEFT  11-12-2008  DR GREG HAYES   RIGHT ICA  2006  ([facility])  . CYSTOSCOPY WITH LITHOLAPAXY N/A 02/26/2013   Procedure:  CYSTOSCOPY WITH LITHOLAPAXY;  Surgeon: Franchot Gallo, MD;  Location: [facility];  Service: Urology;  Laterality: N/A;  . EYE SURGERY  Jan. 2016   cataract surgery both eyes  . INGUINAL HERNIA REPAIR Right 11-08-2006  . MASS EXCISION N/A 03/03/2016   Procedure: EXCISION OF BACK  MASS;  Surgeon: Stark Klein, MD;  Location: [facility];  Service: General;  Laterality: N/A;  . 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 PROSTATE SURGERY    . TRANSURETHRAL RESECTION OF PROSTATE N/A 02/26/2013   Procedure: TRANSURETHRAL RESECTION OF THE PROSTATE WITH GYRUS INSTRUMENTS;  Surgeon: Franchot Gallo, MD;  Location: [facility];  Service: Urology;  Laterality: N/A;    There were no vitals filed for this visit.  Subjective Assessment - 05/24/19 1315    Subjective  My hip is still feeling pretty good.  Reports no falls or stumbles    Currently in Pain?  Yes    Pain Score  2     Pain Location  Hip    Pain Orientation  Left    Pain Descriptors / Indicators  Aching    Aggravating Factors   walking  [facility] Adult PT Treatment/Exercise - 05/24/19 0001      Ambulation/Gait   Gait Comments  gait outside around the front island no device, slow, some left foot drag and some right foot slap, he was off balance on the slopes side to side, and fatigued going up slope      High Level Balance   High Level Balance Comments  on airex head turns and reaching, he does not like the airex, on solid surface he did well with head turns reaching and ball toss      Knee/Hip Exercises: Machines for Strengthening   Cybex Knee Extension  5lb 2x15    Other Machine  seated rows 20#, lats 20# 2x10, chest press 10# 2x10, 5# on the pulleys straight arm pulls, cues for posture and to engage the core      Knee/Hip Exercises: Standing   Hip Flexion  Both;2 sets;10 reps    Hip Flexion Limitations  2#    Hip Abduction  Both;2 sets;10  reps    Abduction Limitations  2#    Hip Extension  Both;2 sets;10 reps    Extension Limitations  2#               PT Short Term Goals - 04/20/19 0920      PT SHORT TERM GOAL #1   Title  independent with initial HEP    Status  Partially Met        PT Long Term Goals - 05/24/19 1358      PT LONG TERM GOAL #1   Title  Understand fall risks, the importance of exercise and flexibility to health and function    Status  Achieved      PT LONG TERM GOAL #3   Title  walk around our building without rest     Status  Partially Met            Plan - 05/24/19 1354    Clinical Impression Statement  PAtient really has a hard time with the dynamic surface standing, is of balance and "makes me uncomfortable",  He did okay on the walking with no pain, but still side to side on the slant and fatigue with the slopes    PT Next Visit Plan  slowly progress as long as his pain is less, try to get him walking and stronger in the core    Consulted and Agree with Plan of Care  Patient       Patient will benefit from skilled therapeutic intervention in order to improve the following deficits and impairments:  Abnormal gait, Decreased cognition, Cardiopulmonary status limiting activity, Decreased activity tolerance, Decreased endurance, Decreased range of motion, Decreased strength, Decreased balance, Difficulty walking, Decreased safety awareness, Improper body mechanics, Decreased mobility  Visit Diagnosis: Difficulty in walking, not elsewhere classified  Muscle weakness (generalized)  Pain in left leg     Problem List Patient Active Problem List   Diagnosis Date Noted  . Lumbar spondylosis 05/02/2018  . Black head 04/28/2018  . Pain in left knee 03/09/2018  . 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 Trochanteric bursitis of left hip 01/16/2018  . 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 Preventative health care 09/26/2017  . HTN  (hypertension) 07/19/2015  . Hyperlipidemia 07/19/2015  . Closed fracture of fifth metacarpal bone 05/15/2015  . 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  . Great toe pain 02/11/2014  . Dementia with behavioral disturbance ([place])  01/09/2014  . History of right MCA stroke 11/09/2013  . Obesity (BMI 30-39.9) 06/25/2013  . Renal insufficiency 06/25/2013  . Weakness of left arm 06/25/2013  . Aftercare following surgery of the circulatory system, [place] 05/17/2013  . Confusion 04/17/2012  . Sebaceous cyst 03/03/2011  . LUMBAR SPRAIN AND STRAIN 07/31/2010  . URINARY FREQUENCY 09/25/2009  . RIB PAIN, LEFT SIDED 08/29/2009  . DEPRESSION/ANXIETY 08/01/2009  . CAROTID ARTERY STENOSIS, BILATERAL 05/02/2009  . DIZZINESS 05/02/2009  . HEADACHE 05/02/2009  . ECZEMA, ATOPIC 05/31/2008  . UNSPECIFIED VITAMIN D DEFICIENCY 03/01/2008  . FATIGUE 01/26/2008  . BPH (benign prostatic hyperplasia) 08/06/2007  . ANXIETY 12/21/2006  . HOARSENESS 12/21/2006  . FASTING HYPERGLYCEMIA 12/21/2006  . Hyperlipidemia LDL goal <100 09/27/2006  . Unspecified cerebral artery occlusion with cerebral infarction 09/27/2006    Sumner Boast., PT 05/24/2019, 1:59 PM  [place], [name], [address] Phone: [phone]   Fax:  [phone]  Name: William [name] MRN: [id number] Date of Birth: 24-Sep-1940

## 2019-05-28 MED FILL — carBAMazepine ER 100 MG TB1: 100 | 90 days supply | Qty: 180 | Fill #2

## 2019-05-28 NOTE — Progress Notes (Signed)
Virtual Visit via Telephone Note The purpose of this virtual visit is to provide medical care while limiting exposure to the novel coronavirus.    Consent was obtained for phone visit:  Yes.   Answered questions that patient had about telehealth interaction:  Yes.   I discussed the limitations, risks, security and privacy concerns of performing an evaluation and management service by telephone. I also discussed with the patient that there may be a patient responsible charge related to this service. The patient expressed understanding and agreed to proceed.  Pt location: Home Physician Location: office Name of referring provider:  Ann Held, * I connected with .William [name] and his wife at patients initiation/request on 05/29/2019 at 10:30 AM EST by telephone and verified that I am speaking with the correct person using two identifiers.  Pt MRN:  [id number] Pt DOB:  [birth year]   History of Present Illness:  William [name] is a 78 year old manwith dementia, CKD, HTN, CAD, HLD and history of CVAwho follow up for unsteady gait and bilateral leg weakness.    UPDATE: MRI of cervical spine without contrast from 03/10/2019 showed some cervical degenerative disc disease and spondylosis with mild foraminal narrowing but no spinal stenosis or compressive myelopathy.  He has been participating in physical therapy.  He also has received shots in the low back and hip.  Both therapies have been very beneficial.  He notes gait and leg strength have improved.   HISTORY: Prior to COVID, he was going to the gym and seeing a personal trainer but it stopped once the pandemic hit. Since the pandemic, he has been sedentary. He walks the dog once or twice a week. He started having bilateral leg pain around April. No associated back pain. No shooting pain. It is described as an aching in his calves. No associated numbness. He also reports weakness. When he walks, he has trouble picking  up his legs. He seems to shuffle his feet. When he gets up to stand, he feels like he can fall forward. He had one fall while standing at the toilet and he bent forward to spit in toilet. No change in bowel habit. He was having urinary issues so he was taken off of Flomax.   He had been started on new medications a few months ago. He was switched from lisinopril to Norvasc and then lisinopril was added again with HCTZ in order to help with the fluid in the legs.  Hgb A1c from 03/27/18 was 5.8.Vascular US ABI from 12/08/18 showed abnormal toe-brachial index in both lower extremities but otherwise unremarkable.  CK from 01/09/19 was 28.  NCV-EMG of lower extremities on 02/08/19 was normal without evidence of lumbosacral radiculopathy or myopathy.  He does have history of low back pain. Last MRI of lumbar spine from 08/06/10 was personally reviewed and demonstrated mild degenerative changes without significant spinal stenosis or nerve root compression. He had an epidural injection in the lumbar region several months ago for low back pain which was effective.   He previously had leg pain on a statin so he was switched to Zetia. It helped for awhile.       Observations/Objective:   Alert and oriented.  Speech fluent and not dysarthric.  Language intact.   Assessment and Plan:   Bilateral leg pain and weakness.  No electrodiagnostic evidence of lumbosacral radiculopathy, cervical myelopathy, or myopathy.  No signs or symptoms of Parkinson's disease.  Questionable if may be from  Zetia.  He does have hyperreflexia, prominent in the left upper extremity which is likely secondary to prior CVA.  It certainly may be due to arthritis, either of spine or hip.  1.  Reorder physical therapy, as it has been beneficial. 2.  Follow up in office in about 3 months.   Follow Up Instructions:    -I discussed the assessment and treatment plan with the patient. The patient was provided an opportunity  to ask questions and all were answered. The patient agreed with the plan and demonstrated an understanding of the instructions.   The patient was advised to call back or seek an in-person evaluation if the symptoms worsen or if the condition fails to improve as anticipated.    Total Time spent in visit with the patient was:  11 minutes.     Dudley Major, DO

## 2019-05-29 ENCOUNTER — Encounter: Payer: Self-pay | Admitting: Neurology

## 2019-05-29 ENCOUNTER — Ambulatory Visit: Payer: PPO | Admitting: Physical Therapy

## 2019-05-29 ENCOUNTER — Telehealth (INDEPENDENT_AMBULATORY_CARE_PROVIDER_SITE_OTHER): Payer: PPO | Admitting: Neurology

## 2019-05-29 ENCOUNTER — Encounter: Payer: Self-pay | Admitting: Physical Therapy

## 2019-05-29 ENCOUNTER — Other Ambulatory Visit: Payer: Self-pay

## 2019-05-29 VITALS — Ht 71.0 in | Wt 220.0 lb

## 2019-05-29 DIAGNOSIS — M79605 Pain in left leg: Secondary | ICD-10-CM

## 2019-05-29 DIAGNOSIS — M6281 Muscle weakness (generalized): Secondary | ICD-10-CM

## 2019-05-29 DIAGNOSIS — M79604 Pain in right leg: Secondary | ICD-10-CM

## 2019-05-29 DIAGNOSIS — R29898 Other symptoms and signs involving the musculoskeletal system: Secondary | ICD-10-CM | POA: Diagnosis not present

## 2019-05-29 DIAGNOSIS — R262 Difficulty in walking, not elsewhere classified: Secondary | ICD-10-CM

## 2019-05-29 DIAGNOSIS — R531 Weakness: Secondary | ICD-10-CM

## 2019-05-29 MED FILL — ESZOPICLONE 3 MG TABS: 3 | 90 days supply | Qty: 90 | Fill #1

## 2019-05-29 NOTE — Addendum Note (Signed)
Addended by: Ranae Plumber on: 05/29/2019 11:23 AM   Modules accepted: Orders

## 2019-05-29 NOTE — Therapy (Signed)
[place], [name], [address] Phone: [phone]   Fax:  [phone]  Physical Therapy Treatment  Patient Details  Name: William [name] MRN: [id number] Date of Birth: 08/23/1940 Referring Provider (PT): Jaffey   Encounter Date: 05/29/2019  PT End of Session - 05/29/19 5993    Visit Number  21    Date for PT Re-Evaluation  05/31/19    PT Start Time  5701    PT Stop Time  1446    PT Time Calculation (min)  49 min    Activity Tolerance  Patient tolerated treatment well    Behavior During Therapy  [facility] for tasks assessed/performed       Past Medical History:  Diagnosis Date  . ANXIETY   . Arthritis    low back  . Bladder stone   . BPH (benign prostatic hypertrophy)   . Carotid artery occlusion   . 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 CVD (cerebrovascular disease)   . Eczema   . GERD (gastroesophageal reflux disease)   . History of carotid artery stenosis    S/P BILATERAL CEA  . 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 Hyperlipidemia   . Hypertension   . Nocturia   . S/P carotid endarterectomy    BILATERAL ICA--  PATENT PER DUPLEX  05-19-2012  . Squamous cell carcinoma    right calf  . Stroke [facility]) 1996   pt states no weaknes but has severe anxiety  . Urinary frequency   . Vitamin D deficiency     Past Surgical History:  Procedure Laterality Date  . APPENDECTOMY  AS CHILD  . CARDIOVASCULAR STRESS TEST  03-27-2012  DR CRENSHAW   LOW RISK LEXISCAN STUDY-- PROBABLE NORMAL PERFUSION AND SOFT TISSUE ATTENUATION/  NO ISCHEMIA/ EF 51%  . CAROTID ENDARTERECTOMY Bilateral LEFT  11-12-2008  DR GREG HAYES   RIGHT ICA  2006  ([facility])  . CYSTOSCOPY WITH LITHOLAPAXY N/A 02/26/2013   Procedure: CYSTOSCOPY WITH LITHOLAPAXY;  Surgeon: Franchot Gallo, MD;  Location: [facility];  Service: Urology;  Laterality: N/A;  . EYE SURGERY   Jan. 2016   cataract surgery both eyes  . INGUINAL HERNIA REPAIR Right 11-08-2006  . MASS EXCISION N/A 03/03/2016   Procedure: EXCISION OF BACK  MASS;  Surgeon: Stark Klein, MD;  Location: [place];  Service: General;  Laterality: N/A;  . 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 PROSTATE SURGERY    . TRANSURETHRAL RESECTION OF PROSTATE N/A 02/26/2013   Procedure: TRANSURETHRAL RESECTION OF THE PROSTATE WITH GYRUS INSTRUMENTS;  Surgeon: Franchot Gallo, MD;  Location: [facility];  Service: Urology;  Laterality: N/A;    There were no vitals filed for this visit.  Subjective Assessment - 05/29/19 1404    Subjective  Still good with the hip.  no stumbles    Currently in Pain?  Yes    Pain Score  2     Pain Location  Hip                       [facility] Adult PT Treatment/Exercise - 05/29/19 0001      Ambulation/Gait   Gait Comments  some gait in the hall working on better steps and not shuffling      Knee/Hip Exercises: Machines for Strengthening  Other Machine  seated rows 20#, lats 20# 2x10, chest press 10# 2x10, 5# on the pulleys straight arm pulls, cues for posture and to engage the core      Knee/Hip Exercises: Standing   Hip Flexion  Both;2 sets;10 reps    Hip Flexion Limitations  2#    Hip Abduction  Both;2 sets;10 reps    Abduction Limitations  2#    Hip Extension  Both;2 sets;10 reps    Extension Limitations  2#    Other Standing Knee Exercises  resisted gait all directions    Other Standing Knee Exercises  Alt 4in box taps 2x10      Knee/Hip Exercises: Supine   Bridges with Ball Squeeze  15 reps    Other Supine Knee/Hip Exercises  feet on ball K2c, trunk rotation, bridges and isometric abs      Modalities   Modalities  Electrical Stimulation;Moist Heat      Moist Heat Therapy   Number Minutes Moist Heat  15 Minutes    Moist Heat Location  Lumbar Spine      Electrical Stimulation   Electrical Stimulation  Location  lumbar area    Electrical Stimulation Action  IFC    Electrical Stimulation Parameters  sitting    Electrical Stimulation Goals  Pain               PT Short Term Goals - 04/20/19 0920      PT SHORT TERM GOAL #1   Title  independent with initial HEP    Status  Partially Met        PT Long Term Goals - 05/24/19 1358      PT LONG TERM GOAL #1   Title  Understand fall risks, the importance of exercise and flexibility to health and function    Status  Achieved      PT LONG TERM GOAL #3   Title  walk around our building without rest     Status  Partially Met            Plan - 05/29/19 1532    Clinical Impression Statement  Patient is still feeling well, he struggles with walking and balance due to the past CVA, he tends to shuffle, he also has issues with endurance and neglect of the left side.  I will speak with him and his wife next visit about continuing or not    PT Next Visit Plan  Speak with him and wife about renewal    Consulted and Agree with Plan of Care  Patient       Patient will benefit from skilled therapeutic intervention in order to improve the following deficits and impairments:  Abnormal gait, Decreased cognition, Cardiopulmonary status limiting activity, Decreased activity tolerance, Decreased endurance, Decreased range of motion, Decreased strength, Decreased balance, Difficulty walking, Decreased safety awareness, Improper body mechanics, Decreased mobility  Visit Diagnosis: Difficulty in walking, not elsewhere classified  Muscle weakness (generalized)  Pain in left leg     Problem List Patient Active Problem List   Diagnosis Date Noted  . Lumbar spondylosis 05/02/2018  . Black head 04/28/2018  . Pain in left knee 03/09/2018  . 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 Trochanteric bursitis of left hip 01/16/2018  . 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 Preventative health care  09/26/2017  . HTN (hypertension) 07/19/2015  . Hyperlipidemia 07/19/2015  . Closed fracture of fifth metacarpal bone 05/15/2015  .  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  . Great toe pain 02/11/2014  . Dementia with behavioral disturbance ([place]) 01/09/2014  . History of right MCA stroke 11/09/2013  . Obesity (BMI 30-39.9) 06/25/2013  . Renal insufficiency 06/25/2013  . Weakness of left arm 06/25/2013  . Aftercare following surgery of the circulatory system, [place] 05/17/2013  . Confusion 04/17/2012  . Sebaceous cyst 03/03/2011  . LUMBAR SPRAIN AND STRAIN 07/31/2010  . URINARY FREQUENCY 09/25/2009  . RIB PAIN, LEFT SIDED 08/29/2009  . DEPRESSION/ANXIETY 08/01/2009  . CAROTID ARTERY STENOSIS, BILATERAL 05/02/2009  . DIZZINESS 05/02/2009  . HEADACHE 05/02/2009  . ECZEMA, ATOPIC 05/31/2008  . UNSPECIFIED VITAMIN D DEFICIENCY 03/01/2008  . FATIGUE 01/26/2008  . BPH (benign prostatic hyperplasia) 08/06/2007  . ANXIETY 12/21/2006  . HOARSENESS 12/21/2006  . FASTING HYPERGLYCEMIA 12/21/2006  . Hyperlipidemia LDL goal <100 09/27/2006  . Unspecified cerebral artery occlusion with cerebral infarction 09/27/2006    Sumner Boast., PT 05/29/2019, 3:36 PM  [place], [name], [address] Phone: [phone]   Fax:  [phone]  Name: William [name] MRN: [id number] Date of Birth: 1941/04/23

## 2019-05-31 ENCOUNTER — Other Ambulatory Visit: Payer: Self-pay

## 2019-05-31 ENCOUNTER — Ambulatory Visit: Payer: PPO | Admitting: Physical Therapy

## 2019-05-31 ENCOUNTER — Encounter: Payer: Self-pay | Admitting: Physical Therapy

## 2019-05-31 DIAGNOSIS — M6281 Muscle weakness (generalized): Secondary | ICD-10-CM

## 2019-05-31 DIAGNOSIS — R262 Difficulty in walking, not elsewhere classified: Secondary | ICD-10-CM

## 2019-05-31 DIAGNOSIS — M79605 Pain in left leg: Secondary | ICD-10-CM

## 2019-05-31 NOTE — Therapy (Signed)
[place], [name], [address] Phone: [phone]   Fax:  [phone]  Physical Therapy Treatment  Patient Details  Name: William [name] MRN: [id number] Date of Birth: 1940-07-08 Referring Provider (PT): Jaffey   Encounter Date: 05/31/2019  PT End of Session - 05/31/19 3790    Visit Number  22    Date for PT Re-Evaluation  07/02/19    PT Start Time  1310    PT Stop Time  1404    PT Time Calculation (min)  54 min    Activity Tolerance  Patient tolerated treatment well    Behavior During Therapy  [facility] for tasks assessed/performed       Past Medical History:  Diagnosis Date  . ANXIETY   . Arthritis    low back  . Bladder stone   . BPH (benign prostatic hypertrophy)   . Carotid artery occlusion   . 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 CVD (cerebrovascular disease)   . Eczema   . GERD (gastroesophageal reflux disease)   . History of carotid artery stenosis    S/P BILATERAL CEA  . 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 Hyperlipidemia   . Hypertension   . Nocturia   . S/P carotid endarterectomy    BILATERAL ICA--  PATENT PER DUPLEX  05-19-2012  . Squamous cell carcinoma    right calf  . Stroke [facility]) 1996   pt states no weaknes but has severe anxiety  . Urinary frequency   . Vitamin D deficiency     Past Surgical History:  Procedure Laterality Date  . APPENDECTOMY  AS CHILD  . CARDIOVASCULAR STRESS TEST  03-27-2012  DR CRENSHAW   LOW RISK LEXISCAN STUDY-- PROBABLE NORMAL PERFUSION AND SOFT TISSUE ATTENUATION/  NO ISCHEMIA/ EF 51%  . CAROTID ENDARTERECTOMY Bilateral LEFT  11-12-2008  DR GREG HAYES   RIGHT ICA  2006  ([facility])  . CYSTOSCOPY WITH LITHOLAPAXY N/A 02/26/2013   Procedure: CYSTOSCOPY WITH LITHOLAPAXY;  Surgeon: Franchot Gallo, MD;  [facility];  Service: Urology;  Laterality: N/A;  . EYE SURGERY   Jan. 2016   cataract surgery both eyes  . INGUINAL HERNIA REPAIR Right 11-08-2006  . MASS EXCISION N/A 03/03/2016   Procedure: EXCISION OF BACK  MASS;  Surgeon: Stark Klein, MD;  Location: [place];  Service: General;  Laterality: N/A;  . 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 PROSTATE SURGERY    . TRANSURETHRAL RESECTION OF PROSTATE N/A 02/26/2013   Procedure: TRANSURETHRAL RESECTION OF THE PROSTATE WITH GYRUS INSTRUMENTS;  Surgeon: Franchot Gallo, MD;  Location: [facility];  Service: Urology;  Laterality: N/A;    There were no vitals filed for this visit.  Subjective Assessment - 05/31/19 1314    Subjective  Patient reports that he is doing okay, reports less pain and has walked the dogs once, but had pain, pain at end of the walking, and very fatigued    Currently in Pain?  Yes    Pain Score  2     Pain Location  Hip    Pain Orientation  Left    Pain Descriptors / Indicators  Aching    Aggravating Factors   walking         [facility] PT Assessment - 05/31/19 0001      Assessment  Medical Diagnosis  Leg weakness, leg pain , difficulty walking    Referring Provider (PT)  Tana Felts                   [facility] Adult PT Treatment/Exercise - 05/31/19 0001      Ambulation/Gait   Gait Comments  gait around the front parking island, some left toe scuffing, going down the hill he was a little out of control and needed to stop and rest, some c/o pain at the end of the walking      Knee/Hip Exercises: Aerobic   Other Aerobic  UBE level 5 x 6 minutes      Knee/Hip Exercises: Machines for Strengthening   Other Machine  seated rows 20#, lats 20# 2x10, chest press 10# 2x10, 5# on the pulleys straight arm pulls, cues for posture and to engage the core      Knee/Hip Exercises: Standing   Hip Flexion  Both;2 sets;10 reps    Hip Flexion Limitations  2#    Hip Abduction  Both;2 sets;10 reps    Abduction Limitations  2#    Hip Extension   Both;2 sets;10 reps    Extension Limitations  2#    Other Standing Knee Exercises  2# in each hand deadlift to 8" step    Other Standing Knee Exercises  sit to stand with 6# 2x5      Knee/Hip Exercises: Seated   Knee/Hip Flexion  on mat table overhead ball toss and catch for core and balance, then bosu behind partial sit ups    Other Seated Knee/Hip Exercises  5# right hand 3# left hand UE circuit, rolled weighted ball to him and he picked up from floor and threw to me with both hand      Modalities   Modalities  Electrical Stimulation;Moist Heat      Moist Heat Therapy   Number Minutes Moist Heat  15 Minutes    Moist Heat Location  Lumbar Spine      Electrical Stimulation   Electrical Stimulation Location  lumbar area    Electrical Stimulation Action  IFC    Electrical Stimulation Parameters  sitting    Electrical Stimulation Goals  Pain               PT Short Term Goals - 04/20/19 0920      PT SHORT TERM GOAL #1   Title  independent with initial HEP    Status  Partially Met        PT Long Term Goals - 05/31/19 1351      PT LONG TERM GOAL #1   Title  Understand fall risks, the importance of exercise and flexibility to health and function    Status  Achieved      PT LONG TERM GOAL #2   Title  decrease TUG time to 16 seconds    Status  Achieved      PT LONG TERM GOAL #3   Title  walk around our building without rest     Status  Partially Met      PT LONG TERM GOAL #4   Title  decrease pain with walking by 50%    Status  Partially Met      PT LONG TERM GOAL #5   Title  increase lumbar ROM 25%            Plan - 05/31/19 1348    Clinical Impression Statement  Patient did really well with treatment today,  he walked better, did a little more functional stuff and he really did well, just some cues for form and safety    PT Next Visit Plan  renew and work on function    Consulted and Agree with Plan of Care  Patient       Patient will benefit from  skilled therapeutic intervention in order to improve the following deficits and impairments:  Abnormal gait, Decreased cognition, Cardiopulmonary status limiting activity, Decreased activity tolerance, Decreased endurance, Decreased range of motion, Decreased strength, Decreased balance, Difficulty walking, Decreased safety awareness, Improper body mechanics, Decreased mobility  Visit Diagnosis: Difficulty in walking, not elsewhere classified - Plan: PT plan of care cert/re-cert  Muscle weakness (generalized) - Plan: PT plan of care cert/re-cert  Pain in left leg - Plan: PT plan of care cert/re-cert     Problem List Patient Active Problem List   Diagnosis Date Noted  . Lumbar spondylosis 05/02/2018  . Black head 04/28/2018  . Pain in left knee 03/09/2018  . 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 Trochanteric bursitis of left hip 01/16/2018  . 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 Preventative health care 09/26/2017  . HTN (hypertension) 07/19/2015  . Hyperlipidemia 07/19/2015  . Closed fracture of fifth metacarpal bone 05/15/2015  . 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  . Great toe pain 02/11/2014  . Dementia with behavioral disturbance ([place]) 01/09/2014  . History of right MCA stroke 11/09/2013  . Obesity (BMI 30-39.9) 06/25/2013  . Renal insufficiency 06/25/2013  . Weakness of left arm 06/25/2013  . Aftercare following surgery of the circulatory system, [place] 05/17/2013  . Confusion 04/17/2012  . Sebaceous cyst 03/03/2011  . LUMBAR SPRAIN AND STRAIN 07/31/2010  . URINARY FREQUENCY 09/25/2009  . RIB PAIN, LEFT SIDED 08/29/2009  . DEPRESSION/ANXIETY 08/01/2009  . CAROTID ARTERY STENOSIS, BILATERAL 05/02/2009  . DIZZINESS 05/02/2009  . HEADACHE 05/02/2009  . ECZEMA, ATOPIC 05/31/2008  . UNSPECIFIED VITAMIN D DEFICIENCY 03/01/2008  . FATIGUE 01/26/2008  . BPH (benign prostatic hyperplasia) 08/06/2007  . ANXIETY 12/21/2006  .  HOARSENESS 12/21/2006  . FASTING HYPERGLYCEMIA 12/21/2006  . Hyperlipidemia LDL goal <100 09/27/2006  . Unspecified cerebral artery occlusion with cerebral infarction 09/27/2006    Sumner Boast., PT 05/31/2019, 1:54 PM  [place], [name], [address] Phone: [phone]   Fax:  [phone]  Name: William [name] MRN: [id number] Date of Birth: 1941/06/06

## 2019-06-06 DIAGNOSIS — M1612 Unilateral primary osteoarthritis, left hip: Secondary | ICD-10-CM | POA: Diagnosis not present

## 2019-06-06 DIAGNOSIS — M47816 Spondylosis without myelopathy or radiculopathy, lumbar region: Secondary | ICD-10-CM | POA: Diagnosis not present

## 2019-06-07 ENCOUNTER — Encounter: Payer: Self-pay | Admitting: Physical Therapy

## 2019-06-07 ENCOUNTER — Ambulatory Visit: Payer: PPO | Admitting: Physical Therapy

## 2019-06-07 ENCOUNTER — Other Ambulatory Visit: Payer: Self-pay

## 2019-06-07 DIAGNOSIS — M79605 Pain in left leg: Secondary | ICD-10-CM

## 2019-06-07 DIAGNOSIS — M6281 Muscle weakness (generalized): Secondary | ICD-10-CM

## 2019-06-07 DIAGNOSIS — R262 Difficulty in walking, not elsewhere classified: Secondary | ICD-10-CM | POA: Diagnosis not present

## 2019-06-07 NOTE — Therapy (Signed)
[place], [name], [address] Phone: [phone]   Fax:  [phone]  Physical Therapy Treatment  Patient Details  Name: William [name] MRN: [id number] Date of Birth: 1941-05-27 Referring Provider (PT): Jaffey   Encounter Date: 06/07/2019  PT End of Session - 06/07/19 1143    Visit Number  23    Date for PT Re-Evaluation  07/02/19    PT Start Time  1108    PT Stop Time  1202    PT Time Calculation (min)  54 min    Activity Tolerance  Patient tolerated treatment well    Behavior During Therapy  [facility] for tasks assessed/performed       Past Medical History:  Diagnosis Date  . ANXIETY   . Arthritis    low back  . Bladder stone   . BPH (benign prostatic hypertrophy)   . Carotid artery occlusion   . 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 CVD (cerebrovascular disease)   . Eczema   . GERD (gastroesophageal reflux disease)   . History of carotid artery stenosis    S/P BILATERAL CEA  . 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 Hyperlipidemia   . Hypertension   . Nocturia   . S/P carotid endarterectomy    BILATERAL ICA--  PATENT PER DUPLEX  05-19-2012  . Squamous cell carcinoma    right calf  . Stroke [facility]) 1996   pt states no weaknes but has severe anxiety  . Urinary frequency   . Vitamin D deficiency     Past Surgical History:  Procedure Laterality Date  . APPENDECTOMY  AS CHILD  . CARDIOVASCULAR STRESS TEST  03-27-2012  DR CRENSHAW   LOW RISK LEXISCAN STUDY-- PROBABLE NORMAL PERFUSION AND SOFT TISSUE ATTENUATION/  NO ISCHEMIA/ EF 51%  . CAROTID ENDARTERECTOMY Bilateral LEFT  11-12-2008  DR GREG HAYES   RIGHT ICA  2006  ([facility])  . CYSTOSCOPY WITH LITHOLAPAXY N/A 02/26/2013   Procedure: CYSTOSCOPY WITH LITHOLAPAXY;  Surgeon: Franchot Gallo, MD;  Location: [facility];  Service: Urology;  Laterality: N/A;  . EYE SURGERY   Jan. 2016   cataract surgery both eyes  . INGUINAL HERNIA REPAIR Right 11-08-2006  . MASS EXCISION N/A 03/03/2016   Procedure: EXCISION OF BACK  MASS;  Surgeon: Stark Klein, MD;  Location: [place];  Service: General;  Laterality: N/A;  . 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 PROSTATE SURGERY    . TRANSURETHRAL RESECTION OF PROSTATE N/A 02/26/2013   Procedure: TRANSURETHRAL RESECTION OF THE PROSTATE WITH GYRUS INSTRUMENTS;  Surgeon: Franchot Gallo, MD;  Location[facility];  Service: Urology;  Laterality: N/A;    There were no vitals filed for this visit.  Subjective Assessment - 06/07/19 1117    Subjective  Patient reportst hat he is feeling pretty good, not doing bad, reports that he has had company    Currently in Pain?  Yes    Pain Score  2     Pain Location  Hip    Pain Orientation  Left    Pain Descriptors / Indicators  Aching                       [facility] Adult PT Treatment/Exercise - 06/07/19 0001      Knee/Hip Exercises: Aerobic  Other Aerobic  UBE level 5 x 6 minutes      Knee/Hip Exercises: Machines for Strengthening   Other Machine  seated rows 20#, lats 20# 2x10, chest press 10# 2x10, 5# on the pulleys straight arm pulls, cues for posture and to engage the core      Knee/Hip Exercises: Standing   Hip Flexion  Both;2 sets;10 reps    Hip Abduction  Both;2 sets;10 reps    Abduction Limitations  2#    Hip Extension  Both;2 sets;10 reps    Extension Limitations  2#    Other Standing Knee Exercises  sit to stand with 6# 2x5      Knee/Hip Exercises: Seated   Knee/Hip Flexion  on mat table overhead ball toss and catch for core and balance, then bosu behind partial sit ups    Other Seated Knee/Hip Exercises  5# right hand 3# left hand UE circuit, rolled weighted ball to him and he picked up from floor and threw to me with both hand    Sit to Sand  2 sets;15 reps;10 reps;without UE support      Knee/Hip  Exercises: Supine   Other Supine Knee/Hip Exercises  feet on ball K2c, trunk rotation, bridges and isometric abs      Modalities   Modalities  Electrical Stimulation;Moist Heat      Moist Heat Therapy   Number Minutes Moist Heat  10 Minutes    Moist Heat Location  Lumbar Spine      Electrical Stimulation   Electrical Stimulation Location  lumbar area    Electrical Stimulation Action  IFC    Electrical Stimulation Parameters  sitting    Electrical Stimulation Goals  Pain               PT Short Term Goals - 04/20/19 0920      PT SHORT TERM GOAL #1   Title  independent with initial HEP    Status  Partially Met        PT Long Term Goals - 06/07/19 1146      PT LONG TERM GOAL #1   Title  Understand fall risks, the importance of exercise and flexibility to health and function    Status  Achieved      PT LONG TERM GOAL #3   Title  walk around our building without rest     Status  Partially Met      PT LONG TERM GOAL #4   Title  decrease pain with walking by 50%    Status  Partially Met            Plan - 06/07/19 1144    Clinical Impression Statement  I continue to try to push his exercises and abilities, he is moving better and seems to be having less pain.  He does still struggle with some of the hip exercises.    PT Next Visit Plan  work on function especially to get him more active at home    Consulted and Agree with Plan of Care  Patient       Patient will benefit from skilled therapeutic intervention in order to improve the following deficits and impairments:  Abnormal gait, Decreased cognition, Cardiopulmonary status limiting activity, Decreased activity tolerance, Decreased endurance, Decreased range of motion, Decreased strength, Decreased balance, Difficulty walking, Decreased safety awareness, Improper body mechanics, Decreased mobility  Visit Diagnosis: Difficulty in walking, not elsewhere classified  Muscle weakness (generalized)  Pain in  left leg  Problem List Patient Active Problem List   Diagnosis Date Noted  . Lumbar spondylosis 05/02/2018  . Black head 04/28/2018  . Pain in left knee 03/09/2018  . 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 Trochanteric bursitis of left hip 01/16/2018  . 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 Preventative health care 09/26/2017  . HTN (hypertension) 07/19/2015  . Hyperlipidemia 07/19/2015  . Closed fracture of fifth metacarpal bone 05/15/2015  . 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  . Great toe pain 02/11/2014  . Dementia with behavioral disturbance ([place]) 01/09/2014  . History of right MCA stroke 11/09/2013  . Obesity (BMI 30-39.9) 06/25/2013  . Renal insufficiency 06/25/2013  . Weakness of left arm 06/25/2013  . Aftercare following surgery of the circulatory system, [place] 05/17/2013  . Confusion 04/17/2012  . Sebaceous cyst 03/03/2011  . LUMBAR SPRAIN AND STRAIN 07/31/2010  . URINARY FREQUENCY 09/25/2009  . RIB PAIN, LEFT SIDED 08/29/2009  . DEPRESSION/ANXIETY 08/01/2009  . CAROTID ARTERY STENOSIS, BILATERAL 05/02/2009  . DIZZINESS 05/02/2009  . HEADACHE 05/02/2009  . ECZEMA, ATOPIC 05/31/2008  . UNSPECIFIED VITAMIN D DEFICIENCY 03/01/2008  . FATIGUE 01/26/2008  . BPH (benign prostatic hyperplasia) 08/06/2007  . ANXIETY 12/21/2006  . HOARSENESS 12/21/2006  . FASTING HYPERGLYCEMIA 12/21/2006  . Hyperlipidemia LDL goal <100 09/27/2006  . Unspecified cerebral artery occlusion with cerebral infarction 09/27/2006    Sumner Boast., PT 06/07/2019, 11:47 AM  [place], [name], [address] Phone: [phone]   Fax:  [phone]  Name: William [name] MRN: [id number] Date of Birth: 05-29-1941

## 2019-06-11 ENCOUNTER — Telehealth (HOSPITAL_COMMUNITY): Payer: Self-pay

## 2019-06-11 NOTE — Telephone Encounter (Signed)
Lab results came in for this patient. Results are in your box for review. Thank you

## 2019-06-12 ENCOUNTER — Encounter: Payer: Self-pay | Admitting: Physical Therapy

## 2019-06-12 ENCOUNTER — Other Ambulatory Visit: Payer: Self-pay

## 2019-06-12 ENCOUNTER — Ambulatory Visit: Payer: PPO | Admitting: Physical Therapy

## 2019-06-12 DIAGNOSIS — M79605 Pain in left leg: Secondary | ICD-10-CM

## 2019-06-12 DIAGNOSIS — R262 Difficulty in walking, not elsewhere classified: Secondary | ICD-10-CM | POA: Diagnosis not present

## 2019-06-12 DIAGNOSIS — M6281 Muscle weakness (generalized): Secondary | ICD-10-CM

## 2019-06-12 NOTE — Therapy (Signed)
[place], [name], [address] Phone: [phone]   Fax:  [phone]  Physical Therapy Treatment  Patient Details  Name: William [name] MRN: [id number] Date of Birth: October 01, 1940 Referring Provider (PT): Jaffey   Encounter Date: 06/12/2019  PT End of Session - 06/12/19 1439    Visit Number  24    Date for PT Re-Evaluation  07/02/19    PT Start Time  1657    PT Stop Time  1451    PT Time Calculation (min)  53 min    Activity Tolerance  Patient tolerated treatment well    Behavior During Therapy  [facility] for tasks assessed/performed       Past Medical History:  Diagnosis Date  . ANXIETY   . Arthritis    low back  . Bladder stone   . BPH (benign prostatic hypertrophy)   . Carotid artery occlusion   . 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 CVD (cerebrovascular disease)   . Eczema   . GERD (gastroesophageal reflux disease)   . History of carotid artery stenosis    S/P BILATERAL CEA  . 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 Hyperlipidemia   . Hypertension   . Nocturia   . S/P carotid endarterectomy    BILATERAL ICA--  PATENT PER DUPLEX  05-19-2012  . Squamous cell carcinoma    right calf  . Stroke [facility]) 1996   pt states no weaknes but has severe anxiety  . Urinary frequency   . Vitamin D deficiency     Past Surgical History:  Procedure Laterality Date  . APPENDECTOMY  AS CHILD  . CARDIOVASCULAR STRESS TEST  03-27-2012  DR CRENSHAW   LOW RISK LEXISCAN STUDY-- PROBABLE NORMAL PERFUSION AND SOFT TISSUE ATTENUATION/  NO ISCHEMIA/ EF 51%  . CAROTID ENDARTERECTOMY Bilateral LEFT  11-12-2008  DR GREG HAYES   RIGHT ICA  2006  ([facility])  . CYSTOSCOPY WITH LITHOLAPAXY N/A 02/26/2013   Procedure: CYSTOSCOPY WITH LITHOLAPAXY;  Surgeon: Franchot Gallo, MD;  Location: [facility];  Service: Urology;  Laterality: N/A;  . EYE SURGERY   Jan. 2016   cataract surgery both eyes  . INGUINAL HERNIA REPAIR Right 11-08-2006  . MASS EXCISION N/A 03/03/2016   Procedure: EXCISION OF BACK  MASS;  Surgeon: Stark Klein, MD;  Location: [place];  Service: General;  Laterality: N/A;  . 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 PROSTATE SURGERY    . TRANSURETHRAL RESECTION OF PROSTATE N/A 02/26/2013   Procedure: TRANSURETHRAL RESECTION OF THE PROSTATE WITH GYRUS INSTRUMENTS;  Surgeon: Franchot Gallo, MD;  Location: [facility];  Service: Urology;  Laterality: N/A;    There were no vitals filed for this visit.  Subjective Assessment - 06/12/19 1435    Subjective  Patient reports that he is still doing better, less pain, still feels weak ande unmotivated to do much    Currently in Pain?  Yes    Pain Location  Hip    Pain Orientation  Left;Lower    Aggravating Factors   walking and standing    Pain Relieving Factors  The treatment helps                       [facility] Adult PT Treatment/Exercise - 06/12/19 0001      Knee/Hip  Exercises: Aerobic   Recumbent Bike  3 minutes    Other Aerobic  UBE level 5 x 6 minutes      Knee/Hip Exercises: Machines for Strengthening   Other Machine  seated rows 20#, lats 20# 2x10, chest press 10# 2x10, 5# on the pulleys straight arm pulls, cues for posture and to engage the core, 20# triceps      Knee/Hip Exercises: Standing   Hip Flexion  Both;2 sets;10 reps    Hip Flexion Limitations  3#    Hip Abduction  Both;2 sets;10 reps    Abduction Limitations  3#    Hip Extension  Both;2 sets;10 reps    Extension Limitations  3#    Other Standing Knee Exercises  sit to stand with 6# 2x5      Knee/Hip Exercises: Seated   Knee/Hip Flexion  on mat table overhead ball toss and catch for core and balance, then bosu behind partial sit ups    Other Seated Knee/Hip Exercises  bosu behind partial sit ups      Knee/Hip Exercises: Supine   Bridges with  Ball Squeeze  15 reps    Other Supine Knee/Hip Exercises  feet on ball K2c, trunk rotation, bridges and isometric abs      Modalities   Modalities  Electrical Stimulation;Moist Heat      Moist Heat Therapy   Number Minutes Moist Heat  10 Minutes    Moist Heat Location  Lumbar Spine      Electrical Stimulation   Electrical Stimulation Location  lumbar area    Electrical Stimulation Action  IFC    Electrical Stimulation Parameters  sitting    Electrical Stimulation Goals  Pain               PT Short Term Goals - 04/20/19 0920      PT SHORT TERM GOAL #1   Title  independent with initial HEP    Status  Partially Met        PT Long Term Goals - 06/12/19 1441      PT LONG TERM GOAL #3   Title  walk around our building without rest     Status  Partially Met            Plan - 06/12/19 1440    Clinical Impression Statement  Patient is needing assist with cues to get hte left arm working, cues needed for posture and core activation.  He did not want to try to walk today but over the next few visits I feel like this is what we should focus on to get his stamina and endurance up so he can be more functional    PT Next Visit Plan  walk    Consulted and Agree with Plan of Care  Patient       Patient will benefit from skilled therapeutic intervention in order to improve the following deficits and impairments:  Abnormal gait, Decreased cognition, Cardiopulmonary status limiting activity, Decreased activity tolerance, Decreased endurance, Decreased range of motion, Decreased strength, Decreased balance, Difficulty walking, Decreased safety awareness, Improper body mechanics, Decreased mobility  Visit Diagnosis: Difficulty in walking, not elsewhere classified  Muscle weakness (generalized)  Pain in left leg     Problem List Patient Active Problem List   Diagnosis Date Noted  . Lumbar spondylosis 05/02/2018  . Black head 04/28/2018  . Pain in left knee 03/09/2018   . 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 Trochanteric bursitis of left hip 01/16/2018  .  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 Preventative health care 09/26/2017  . HTN (hypertension) 07/19/2015  . Hyperlipidemia 07/19/2015  . Closed fracture of fifth metacarpal bone 05/15/2015  . 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  . Great toe pain 02/11/2014  . Dementia with behavioral disturbance ([place]) 01/09/2014  . History of right MCA stroke 11/09/2013  . Obesity (BMI 30-39.9) 06/25/2013  . Renal insufficiency 06/25/2013  . Weakness of left arm 06/25/2013  . Aftercare following surgery of the circulatory system, [place] 05/17/2013  . Confusion 04/17/2012  . Sebaceous cyst 03/03/2011  . LUMBAR SPRAIN AND STRAIN 07/31/2010  . URINARY FREQUENCY 09/25/2009  . RIB PAIN, LEFT SIDED 08/29/2009  . DEPRESSION/ANXIETY 08/01/2009  . CAROTID ARTERY STENOSIS, BILATERAL 05/02/2009  . DIZZINESS 05/02/2009  . HEADACHE 05/02/2009  . ECZEMA, ATOPIC 05/31/2008  . UNSPECIFIED VITAMIN D DEFICIENCY 03/01/2008  . FATIGUE 01/26/2008  . BPH (benign prostatic hyperplasia) 08/06/2007  . ANXIETY 12/21/2006  . HOARSENESS 12/21/2006  . FASTING HYPERGLYCEMIA 12/21/2006  . Hyperlipidemia LDL goal <100 09/27/2006  . Unspecified cerebral artery occlusion with cerebral infarction 09/27/2006    Sumner Boast., PT 06/12/2019, 2:42 PM  [place], [name], [address] Phone: [phone]   Fax:  [phone]  Name: ELEANOR DIMICHELE MRN: [id number] Date of Birth: 1941-03-17

## 2019-06-13 ENCOUNTER — Encounter: Payer: Self-pay | Admitting: Family Medicine

## 2019-06-18 ENCOUNTER — Other Ambulatory Visit: Payer: Self-pay | Admitting: Family Medicine

## 2019-06-18 MED FILL — LISINOPRIL-HCTZ 10-12.5 MG: 10-12.5 | 30 days supply | Qty: 30 | Fill #2

## 2019-06-19 ENCOUNTER — Encounter: Payer: Self-pay | Admitting: Physical Therapy

## 2019-06-19 ENCOUNTER — Ambulatory Visit: Payer: PPO | Attending: Neurology | Admitting: Physical Therapy

## 2019-06-19 ENCOUNTER — Other Ambulatory Visit: Payer: Self-pay

## 2019-06-19 DIAGNOSIS — M6281 Muscle weakness (generalized): Secondary | ICD-10-CM | POA: Diagnosis not present

## 2019-06-19 DIAGNOSIS — R262 Difficulty in walking, not elsewhere classified: Secondary | ICD-10-CM

## 2019-06-19 DIAGNOSIS — M79605 Pain in left leg: Secondary | ICD-10-CM

## 2019-06-19 MED FILL — FENOFIBRATE 160 MG TABLET: 160 | 90 days supply | Qty: 90 | Fill #0

## 2019-06-19 NOTE — Therapy (Signed)
[place], [name], [address] Phone: [phone]   Fax:  [phone]  Physical Therapy Treatment  Patient Details  Name: William [name] MRN: [id number] Date of Birth: 08-05-1940 Referring Provider (PT): Jaffey   Encounter Date: 06/19/2019  PT End of Session - 06/19/19 1434    Visit Number  25    Date for PT Re-Evaluation  07/02/19    PT Start Time  1355    PT Stop Time  1450    PT Time Calculation (min)  55 min    Activity Tolerance  Patient tolerated treatment well    Behavior During Therapy  [facility] for tasks assessed/performed       Past Medical History:  Diagnosis Date  . ANXIETY   . Arthritis    low back  . Bladder stone   . BPH (benign prostatic hypertrophy)   . Carotid artery occlusion   . 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 CVD (cerebrovascular disease)   . Eczema   . GERD (gastroesophageal reflux disease)   . History of carotid artery stenosis    S/P BILATERAL CEA  . 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 Hyperlipidemia   . Hypertension   . Nocturia   . S/P carotid endarterectomy    BILATERAL ICA--  PATENT PER DUPLEX  05-19-2012  . Squamous cell carcinoma    right calf  . Stroke [facility]) 1996   pt states no weaknes but has severe anxiety  . Urinary frequency   . Vitamin D deficiency     Past Surgical History:  Procedure Laterality Date  . APPENDECTOMY  AS CHILD  . CARDIOVASCULAR STRESS TEST  03-27-2012  DR CRENSHAW   LOW RISK LEXISCAN STUDY-- PROBABLE NORMAL PERFUSION AND SOFT TISSUE ATTENUATION/  NO ISCHEMIA/ EF 51%  . CAROTID ENDARTERECTOMY Bilateral LEFT  11-12-2008  DR GREG HAYES   RIGHT ICA  2006  ([facility])  . CYSTOSCOPY WITH LITHOLAPAXY N/A 02/26/2013   Procedure: CYSTOSCOPY WITH LITHOLAPAXY;  Surgeon: Franchot Gallo, MD;  Location: [facility];  Service: Urology;  Laterality: N/A;  . EYE SURGERY   Jan. 2016   cataract surgery both eyes  . INGUINAL HERNIA REPAIR Right 11-08-2006  . MASS EXCISION N/A 03/03/2016   Procedure: EXCISION OF BACK  MASS;  Surgeon: Stark Klein, MD;  Location: [place];  Service: General;  Laterality: N/A;  . 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 PROSTATE SURGERY    . TRANSURETHRAL RESECTION OF PROSTATE N/A 02/26/2013   Procedure: TRANSURETHRAL RESECTION OF THE PROSTATE WITH GYRUS INSTRUMENTS;  Surgeon: Franchot Gallo, MD;  Location: [facility];  Service: Urology;  Laterality: N/A;    There were no vitals filed for this visit.  Subjective Assessment - 06/19/19 1431    Subjective  Patient has been walking the dog more, still some limitation in the distance due to fatigue and some pain    Currently in Pain?  Yes    Pain Location  Back    Pain Orientation  Lower    Aggravating Factors   pain mostly with walking                       [facility] Adult PT Treatment/Exercise - 06/19/19 0001      Ambulation/Gait   Gait Comments  gait in hall  x 240 feet some ball tossing with walking, cues needed to do this as he would stop completely, 1/2 flight of stairs one at a time down and step over step going up with CGA      High Level Balance   High Level Balance Activities  Side stepping;Backward walking      Knee/Hip Exercises: Stretches   Passive Hamstring Stretch  Right;Left;4 reps;20 seconds    Passive Hamstring Stretch Limitations  painful and tight      Knee/Hip Exercises: Aerobic   Recumbent Bike  3 minutes    Other Aerobic  UBE level 5 x 6 minutes      Knee/Hip Exercises: Machines for Strengthening   Other Machine  seated rows 20#, lats 20# 2x10, chest press 10# 2x10, 5# on the pulleys straight arm pulls, cues for posture and to engage the core, 20# triceps      Knee/Hip Exercises: Standing   Hip Abduction  Both;2 sets;10 reps    Abduction Limitations  3#    Hip Extension  Both;2 sets;10 reps     Extension Limitations  3#    Other Standing Knee Exercises  2# in each hand deadlift to 8" step    Other Standing Knee Exercises  sit to stand with 6# 2x5      Knee/Hip Exercises: Seated   Long Arc Quad  Both;Weights;2 sets;15 reps;10 reps    Long Arc Quad Weight  3 lbs.    Marching  Both;2 sets;10 reps    Marching Weights  3 lbs.      Modalities   Modalities  Electrical Stimulation;Moist Heat      Moist Heat Therapy   Number Minutes Moist Heat  10 Minutes    Moist Heat Location  Lumbar Spine      Electrical Stimulation   Electrical Stimulation Location  lumbar area    Electrical Stimulation Action  IFC    Electrical Stimulation Parameters  sitting    Electrical Stimulation Goals  Pain               PT Short Term Goals - 04/20/19 0920      PT SHORT TERM GOAL #1   Title  independent with initial HEP    Status  Partially Met        PT Long Term Goals - 06/12/19 1441      PT LONG TERM GOAL #3   Title  walk around our building without rest     Status  Partially Met            Plan - 06/19/19 1435    Clinical Impression Statement  Patient is moving better, tolerating more, still pain with stnading and with walking, he has returned to walking the dog some, he was able to go down stairs one at a time and go up step over step with hand rail and CGA, this was very fatiguing for him.  Cues required for posture, core and left UE use as he tends to neglect that left arm    PT Next Visit Plan  walk and core    Consulted and Agree with Plan of Care  Patient       Patient will benefit from skilled therapeutic intervention in order to improve the following deficits and impairments:  Abnormal gait, Decreased cognition, Cardiopulmonary status limiting activity, Decreased activity tolerance, Decreased endurance, Decreased range of motion, Decreased strength, Decreased balance, Difficulty walking, Decreased safety awareness, Improper body mechanics, Decreased  mobility  Visit Diagnosis:  Difficulty in walking, not elsewhere classified  Muscle weakness (generalized)  Pain in left leg     Problem List Patient Active Problem List   Diagnosis Date Noted  . Lumbar spondylosis 05/02/2018  . Black head 04/28/2018  . Pain in left knee 03/09/2018  . 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 Trochanteric bursitis of left hip 01/16/2018  . 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 Preventative health care 09/26/2017  . HTN (hypertension) 07/19/2015  . Hyperlipidemia 07/19/2015  . Closed fracture of fifth metacarpal bone 05/15/2015  . 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  . Great toe pain 02/11/2014  . Dementia with behavioral disturbance ([place]) 01/09/2014  . History of right MCA stroke 11/09/2013  . Obesity (BMI 30-39.9) 06/25/2013  . Renal insufficiency 06/25/2013  . Weakness of left arm 06/25/2013  . Aftercare following surgery of the circulatory system, [place] 05/17/2013  . Confusion 04/17/2012  . Sebaceous cyst 03/03/2011  . LUMBAR SPRAIN AND STRAIN 07/31/2010  . URINARY FREQUENCY 09/25/2009  . RIB PAIN, LEFT SIDED 08/29/2009  . DEPRESSION/ANXIETY 08/01/2009  . CAROTID ARTERY STENOSIS, BILATERAL 05/02/2009  . DIZZINESS 05/02/2009  . HEADACHE 05/02/2009  . ECZEMA, ATOPIC 05/31/2008  . UNSPECIFIED VITAMIN D DEFICIENCY 03/01/2008  . FATIGUE 01/26/2008  . BPH (benign prostatic hyperplasia) 08/06/2007  . ANXIETY 12/21/2006  . HOARSENESS 12/21/2006  . FASTING HYPERGLYCEMIA 12/21/2006  . Hyperlipidemia LDL goal <100 09/27/2006  . Unspecified cerebral artery occlusion with cerebral infarction 09/27/2006    Sumner Boast., PT 06/19/2019, 2:36 PM  [place], [name], [address] Phone: [phone]   Fax:  [phone]  Name: William [name] MRN: [id number] Date of Birth: May 16, 1941

## 2019-06-21 ENCOUNTER — Other Ambulatory Visit: Payer: Self-pay

## 2019-06-21 ENCOUNTER — Ambulatory Visit: Payer: PPO | Admitting: Physical Therapy

## 2019-06-21 ENCOUNTER — Encounter: Payer: Self-pay | Admitting: Physical Therapy

## 2019-06-21 DIAGNOSIS — M6281 Muscle weakness (generalized): Secondary | ICD-10-CM

## 2019-06-21 DIAGNOSIS — R262 Difficulty in walking, not elsewhere classified: Secondary | ICD-10-CM | POA: Diagnosis not present

## 2019-06-21 DIAGNOSIS — M79605 Pain in left leg: Secondary | ICD-10-CM

## 2019-06-21 NOTE — Therapy (Signed)
[place], [name], [address] Phone: [phone]   Fax:  [phone]  Physical Therapy Treatment  Patient Details  Name: William [name] MRN: [id number] Date of Birth: 1941/01/12 Referring Provider (PT): Jaffey   Encounter Date: 06/21/2019  PT End of Session - 06/21/19 1432    Visit Number  26    Date for PT Re-Evaluation  07/02/19    PT Start Time  1354    PT Stop Time  1450    PT Time Calculation (min)  56 min    Activity Tolerance  Patient tolerated treatment well    Behavior During Therapy  [facility] for tasks assessed/performed       Past Medical History:  Diagnosis Date  . ANXIETY   . Arthritis    low back  . Bladder stone   . BPH (benign prostatic hypertrophy)   . Carotid artery occlusion   . 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 CVD (cerebrovascular disease)   . Eczema   . GERD (gastroesophageal reflux disease)   . History of carotid artery stenosis    S/P BILATERAL CEA  . 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 Hyperlipidemia   . Hypertension   . Nocturia   . S/P carotid endarterectomy    BILATERAL ICA--  PATENT PER DUPLEX  05-19-2012  . Squamous cell carcinoma    right calf  . Stroke [facility]) 1996   pt states no weaknes but has severe anxiety  . Urinary frequency   . Vitamin D deficiency     Past Surgical History:  Procedure Laterality Date  . APPENDECTOMY  AS CHILD  . CARDIOVASCULAR STRESS TEST  03-27-2012  DR CRENSHAW   LOW RISK LEXISCAN STUDY-- PROBABLE NORMAL PERFUSION AND SOFT TISSUE ATTENUATION/  NO ISCHEMIA/ EF 51%  . CAROTID ENDARTERECTOMY Bilateral LEFT  11-12-2008  DR GREG HAYES   RIGHT ICA  2006  ([facility])  . CYSTOSCOPY WITH LITHOLAPAXY N/A 02/26/2013   Procedure: CYSTOSCOPY WITH LITHOLAPAXY;  Surgeon: Franchot Gallo, MD;  Location: [facility];  Service: Urology;  Laterality: N/A;  . EYE SURGERY   Jan. 2016   cataract surgery both eyes  . INGUINAL HERNIA REPAIR Right 11-08-2006  . MASS EXCISION N/A 03/03/2016   Procedure: EXCISION OF BACK  MASS;  Surgeon: Stark Klein, MD;  Location: [place];  Service: General;  Laterality: N/A;  . 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 PROSTATE SURGERY    . TRANSURETHRAL RESECTION OF PROSTATE N/A 02/26/2013   Procedure: TRANSURETHRAL RESECTION OF THE PROSTATE WITH GYRUS INSTRUMENTS;  Surgeon: Franchot Gallo, MD;  Location[facility];  Service: Urology;  Laterality: N/A;    There were no vitals filed for this visit.  Subjective Assessment - 06/21/19 1403    Subjective  Patient reports that he is still feeling better, not abl eto walk like he did    Currently in Pain?  Yes    Pain Score  3     Pain Location  Back    Pain Orientation  Lower    Aggravating Factors   walking                       [facility] Adult PT Treatment/Exercise - 06/21/19 0001      Ambulation/Gait   Gait Comments  walked around  the building with about 4 rest breaks, he c/o soreness and tightness after the walk up to 5/10      Knee/Hip Exercises: Stretches   Passive Hamstring Stretch  Right;Left;4 reps;20 seconds    Gastroc Stretch  Both;3 reps;20 seconds      Knee/Hip Exercises: Aerobic   Other Aerobic  UBE level 5 x 6 minutes      Knee/Hip Exercises: Machines for Strengthening   Cybex Knee Extension  5lb 2x15    Cybex Knee Flexion  25lb 2x15    Other Machine  seated rows 20#, lats 20# 2x10, chest press 10# 2x10, 5# on the pulleys straight arm pulls, cues for posture and to engage the core, 20# triceps      Knee/Hip Exercises: Standing   Hip Abduction  Both;2 sets;10 reps    Abduction Limitations  4    Hip Extension  Both;2 sets;10 reps    Extension Limitations  4      Knee/Hip Exercises: Seated   Long Arc Quad  Both;Weights;2 sets;15 reps;10 reps    Long Arc Quad Weight  4 lbs.    Marching  Both;2  sets;10 reps    Marching Weights  4 lbs.      Modalities   Modalities  Electrical Stimulation;Moist Heat      Moist Heat Therapy   Number Minutes Moist Heat  10 Minutes    Moist Heat Location  Lumbar Spine      Electrical Stimulation   Electrical Stimulation Location  lumbar area    Electrical Stimulation Action  IFC    Electrical Stimulation Parameters  sitting    Electrical Stimulation Goals  Pain               PT Short Term Goals - 04/20/19 0920      PT SHORT TERM GOAL #1   Title  independent with initial HEP    Status  Partially Met        PT Long Term Goals - 06/21/19 1434      PT LONG TERM GOAL #3   Title  walk around our building without rest     Status  Partially Met      PT LONG TERM GOAL #4   Title  decrease pain with walking by 50%    Status  Partially Met            Plan - 06/21/19 1433    Clinical Impression Statement  Patient able to walk his farthest yet, this did cause him to take mul;tiple rests and at the end his back was tight and sore c/o pain a 5/10.  He is very tight in the LE's, needed a lot of help with stretches    PT Next Visit Plan  walk and core    Consulted and Agree with Plan of Care  Patient       Patient will benefit from skilled therapeutic intervention in order to improve the following deficits and impairments:  Abnormal gait, Decreased cognition, Cardiopulmonary status limiting activity, Decreased activity tolerance, Decreased endurance, Decreased range of motion, Decreased strength, Decreased balance, Difficulty walking, Decreased safety awareness, Improper body mechanics, Decreased mobility  Visit Diagnosis: Difficulty in walking, not elsewhere classified  Muscle weakness (generalized)  Pain in left leg     Problem List Patient Active Problem List   Diagnosis Date Noted  . Lumbar spondylosis 05/02/2018  . Black head 04/28/2018  . Pain in left knee 03/09/2018  . 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  Trochanteric bursitis of left hip 01/16/2018  . 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 Preventative health care 09/26/2017  . HTN (hypertension) 07/19/2015  . Hyperlipidemia 07/19/2015  . Closed fracture of fifth metacarpal bone 05/15/2015  . 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  . Great toe pain 02/11/2014  . Dementia with behavioral disturbance ([place]) 01/09/2014  . History of right MCA stroke 11/09/2013  . Obesity (BMI 30-39.9) 06/25/2013  . Renal insufficiency 06/25/2013  . Weakness of left arm 06/25/2013  . Aftercare following surgery of the circulatory system, [place] 05/17/2013  . Confusion 04/17/2012  . Sebaceous cyst 03/03/2011  . LUMBAR SPRAIN AND STRAIN 07/31/2010  . URINARY FREQUENCY 09/25/2009  . RIB PAIN, LEFT SIDED 08/29/2009  . DEPRESSION/ANXIETY 08/01/2009  . CAROTID ARTERY STENOSIS, BILATERAL 05/02/2009  . DIZZINESS 05/02/2009  . HEADACHE 05/02/2009  . ECZEMA, ATOPIC 05/31/2008  . UNSPECIFIED VITAMIN D DEFICIENCY 03/01/2008  . FATIGUE 01/26/2008  . BPH (benign prostatic hyperplasia) 08/06/2007  . ANXIETY 12/21/2006  . HOARSENESS 12/21/2006  . FASTING HYPERGLYCEMIA 12/21/2006  . Hyperlipidemia LDL goal <100 09/27/2006  . Unspecified cerebral artery occlusion with cerebral infarction 09/27/2006    Sumner Boast., PT 06/21/2019, 2:35 PM  [place], [name], [address] Phone: [phone]   Fax:  [phone]  Name: William [name] MRN: [id number] Date of Birth: [birth year]

## 2019-06-23 ENCOUNTER — Ambulatory Visit: Payer: Medicare Other | Attending: Internal Medicine

## 2019-06-23 DIAGNOSIS — Z23 Encounter for immunization: Secondary | ICD-10-CM | POA: Insufficient documentation

## 2019-06-23 NOTE — Progress Notes (Signed)
   Covid-19 Vaccination Clinic  Name:  RISHIT BURKHALTER    MRN: [id number] DOB: 02/23/1941  06/23/2019  Mr. Forcier was observed post Covid-19 immunization for 15 minutes without incidence. He was provided with Vaccine Information Sheet and instruction to access the V-Safe system.   Mr. Howerter was instructed to call 911 with any severe reactions post vaccine: [name] Difficulty breathing  . Swelling of your face and throat  . A fast heartbeat  . A bad rash all over your body  . Dizziness and weakness    Immunizations Administered    Name Date Dose VIS Date Route   Pfizer COVID-19 Vaccine 06/23/2019 12:01 PM 0.3 mL 05/25/2019 Intramuscular   Manufacturer: [employer], [employer]   Lot: [id number]   [place]: [address]-1

## 2019-06-26 ENCOUNTER — Encounter: Payer: Self-pay | Admitting: Physical Therapy

## 2019-06-26 ENCOUNTER — Ambulatory Visit: Payer: PPO | Admitting: Physical Therapy

## 2019-06-26 ENCOUNTER — Other Ambulatory Visit: Payer: Self-pay

## 2019-06-26 DIAGNOSIS — R262 Difficulty in walking, not elsewhere classified: Secondary | ICD-10-CM

## 2019-06-26 DIAGNOSIS — M79605 Pain in left leg: Secondary | ICD-10-CM

## 2019-06-26 DIAGNOSIS — M6281 Muscle weakness (generalized): Secondary | ICD-10-CM

## 2019-06-26 NOTE — Therapy (Signed)
[place], [name], [address] Phone: [phone]   Fax:  [phone]  Physical Therapy Treatment  Patient Details  Name: William [name] MRN: [id number] Date of Birth: 01-06-1941 Referring Provider (PT): Jaffey   Encounter Date: 06/26/2019  PT End of Session - 06/26/19 7169    Visit Number  27    Date for PT Re-Evaluation  07/02/19    PT Start Time  1355    PT Stop Time  1444    PT Time Calculation (min)  49 min    Activity Tolerance  Patient tolerated treatment well    Behavior During Therapy  [facility] for tasks assessed/performed       Past Medical History:  Diagnosis Date  . ANXIETY   . Arthritis    low back  . Bladder stone   . BPH (benign prostatic hypertrophy)   . Carotid artery occlusion   . 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 CVD (cerebrovascular disease)   . Eczema   . GERD (gastroesophageal reflux disease)   . History of carotid artery stenosis    S/P BILATERAL CEA  . 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 Hyperlipidemia   . Hypertension   . Nocturia   . S/P carotid endarterectomy    BILATERAL ICA--  PATENT PER DUPLEX  05-19-2012  . Squamous cell carcinoma    right calf  . Stroke [facility]) 1996   pt states no weaknes but has severe anxiety  . Urinary frequency   . Vitamin D deficiency     Past Surgical History:  Procedure Laterality Date  . APPENDECTOMY  AS CHILD  . CARDIOVASCULAR STRESS TEST  03-27-2012  DR CRENSHAW   LOW RISK LEXISCAN STUDY-- PROBABLE NORMAL PERFUSION AND SOFT TISSUE ATTENUATION/  NO ISCHEMIA/ EF 51%  . CAROTID ENDARTERECTOMY Bilateral LEFT  11-12-2008  DR GREG HAYES   RIGHT ICA  2006  ([facility])  . CYSTOSCOPY WITH LITHOLAPAXY N/A 02/26/2013   Procedure: CYSTOSCOPY WITH LITHOLAPAXY;  Surgeon: Franchot Gallo, MD;  Location: [facility];  Service: Urology;  Laterality: N/A;  . EYE SURGERY   Jan. 2016   cataract surgery both eyes  . INGUINAL HERNIA REPAIR Right 11-08-2006  . MASS EXCISION N/A 03/03/2016   Procedure: EXCISION OF BACK  MASS;  Surgeon: Stark Klein, MD;  Location: [place];  Service: General;  Laterality: N/A;  . 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 PROSTATE SURGERY    . TRANSURETHRAL RESECTION OF PROSTATE N/A 02/26/2013   Procedure: TRANSURETHRAL RESECTION OF THE PROSTATE WITH GYRUS INSTRUMENTS;  Surgeon: Franchot Gallo, MD;  Location: [facility];  Service: Urology;  Laterality: N/A;    There were no vitals filed for this visit.  Subjective Assessment - 06/26/19 1404    Subjective  Patient reports that he was tired after last treatment where we walked    Currently in Pain?  Yes    Pain Score  3     Pain Location  Back    Pain Orientation  Lower    Pain Descriptors / Indicators  Aching    Aggravating Factors   walking and standing                       [facility] Adult PT Treatment/Exercise - 06/26/19 0001  Transfers   Comments  worked with him on getting up from the floor, PT demo and education.  II had him try two ways.      Ambulation/Gait   Gait Comments  walk x 350 feet      High Level Balance   High Level Balance Activities  Side stepping;Backward walking      Knee/Hip Exercises: Stretches   Passive Hamstring Stretch  Right;Left;4 reps;20 seconds    Gastroc Stretch  Both;3 reps;20 seconds      Knee/Hip Exercises: Aerobic   Other Aerobic  UBE level 5 x 6 minutes      Knee/Hip Exercises: Standing   Hip Flexion  Both;2 sets;10 reps    Hip Flexion Limitations  3#    Hip Abduction  Both;2 sets;10 reps    Abduction Limitations  4    Hip Extension  Both;2 sets;10 reps    Extension Limitations  4    Other Standing Knee Exercises  sit to stand with 6# 2x5      Knee/Hip Exercises: Seated   Long Arc Quad  Both;Weights;2 sets;15 reps;10 reps    Long Arc Quad Weight  4 lbs.     Marching  Both;2 sets;10 reps    Marching Weights  4 lbs.      Knee/Hip Exercises: Supine   Bridges with [employer]  20 reps    Bridges with Clamshell  20 reps    Straight Leg Raises  Both;15 reps    Other Supine Knee/Hip Exercises  trunk rotation with him holding weight in both hands at the chest    Other Supine Knee/Hip Exercises  feet on ball K2c, trunk rotation, bridges and isometric abs               PT Short Term Goals - 04/20/19 0920      PT SHORT TERM GOAL #1   Title  independent with initial HEP    Status  Partially Met        PT Long Term Goals - 06/21/19 1434      PT LONG TERM GOAL #3   Title  walk around our building without rest     Status  Partially Met      PT LONG TERM GOAL #4   Title  decrease pain with walking by 50%    Status  Partially Met            Plan - 06/26/19 1442    Clinical Impression Statement  Still doing well with his walking, he had some fear and voiced worry about falling and getting up in the future, so we worked on this some, he needed help to go from on bottom to on knees did pretty good with mod assist to get up from there    PT Next Visit Plan  walking, bed mobility and up from the floor    Consulted and Agree with Plan of Care  Patient       Patient will benefit from skilled therapeutic intervention in order to improve the following deficits and impairments:  Abnormal gait, Decreased cognition, Cardiopulmonary status limiting activity, Decreased activity tolerance, Decreased endurance, Decreased range of motion, Decreased strength, Decreased balance, Difficulty walking, Decreased safety awareness, Improper body mechanics, Decreased mobility  Visit Diagnosis: Difficulty in walking, not elsewhere classified  Muscle weakness (generalized)  Pain in left leg     Problem List Patient Active Problem List   Diagnosis Date Noted  . Lumbar spondylosis 05/02/2018  . Black  head 04/28/2018  . Pain in left knee  03/09/2018  . 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 Trochanteric bursitis of left hip 01/16/2018  . 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 Preventative health care 09/26/2017  . HTN (hypertension) 07/19/2015  . Hyperlipidemia 07/19/2015  . Closed fracture of fifth metacarpal bone 05/15/2015  . 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  . Great toe pain 02/11/2014  . Dementia with behavioral disturbance ([place]) 01/09/2014  . History of right MCA stroke 11/09/2013  . Obesity (BMI 30-39.9) 06/25/2013  . Renal insufficiency 06/25/2013  . Weakness of left arm 06/25/2013  . Aftercare following surgery of the circulatory system, [place] 05/17/2013  . Confusion 04/17/2012  . Sebaceous cyst 03/03/2011  . LUMBAR SPRAIN AND STRAIN 07/31/2010  . URINARY FREQUENCY 09/25/2009  . RIB PAIN, LEFT SIDED 08/29/2009  . DEPRESSION/ANXIETY 08/01/2009  . CAROTID ARTERY STENOSIS, BILATERAL 05/02/2009  . DIZZINESS 05/02/2009  . HEADACHE 05/02/2009  . ECZEMA, ATOPIC 05/31/2008  . UNSPECIFIED VITAMIN D DEFICIENCY 03/01/2008  . FATIGUE 01/26/2008  . BPH (benign prostatic hyperplasia) 08/06/2007  . ANXIETY 12/21/2006  . HOARSENESS 12/21/2006  . FASTING HYPERGLYCEMIA 12/21/2006  . Hyperlipidemia LDL goal <100 09/27/2006  . Unspecified cerebral artery occlusion with cerebral infarction 09/27/2006    Sumner Boast., PT 06/26/2019, 2:44 PM  [place], [name], [address] Phone: [phone]   Fax:  [phone]  Name: William [name] MRN: [id number] Date of Birth: Jan 28, 1941

## 2019-06-27 MED FILL — LORazepam 0.5 MG TABS: 0.5 | 30 days supply | Qty: 150 | Fill #3

## 2019-06-28 ENCOUNTER — Encounter: Payer: Self-pay | Admitting: Physical Therapy

## 2019-06-28 ENCOUNTER — Ambulatory Visit: Payer: PPO | Admitting: Physical Therapy

## 2019-06-28 ENCOUNTER — Other Ambulatory Visit: Payer: Self-pay

## 2019-06-28 DIAGNOSIS — R262 Difficulty in walking, not elsewhere classified: Secondary | ICD-10-CM | POA: Diagnosis not present

## 2019-06-28 DIAGNOSIS — M6281 Muscle weakness (generalized): Secondary | ICD-10-CM

## 2019-06-28 DIAGNOSIS — M79605 Pain in left leg: Secondary | ICD-10-CM

## 2019-06-28 NOTE — Therapy (Signed)
[name], [name], [address] Phone: [phone]   Fax:  [phone]  Physical Therapy Treatment  Patient Details  Name: William [name] MRN: [id number] Date of Birth: [birth year] Referring Provider (PT): Jaffey   Encounter Date: 06/28/2019  PT End of Session - 06/28/19 1514    Visit Number  28    Date for PT Re-Evaluation  07/02/19    PT Start Time  1350    PT Stop Time  1445    PT Time Calculation (min)  55 min    Activity Tolerance  Patient tolerated treatment well;Patient limited by pain    Behavior During Therapy  [facility] for tasks assessed/performed       Past Medical History:  Diagnosis Date  . ANXIETY   . Arthritis    low back  . Bladder stone   . BPH (benign prostatic hypertrophy)   . Carotid artery occlusion   . 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 CVD (cerebrovascular disease)   . Eczema   . GERD (gastroesophageal reflux disease)   . History of carotid artery stenosis    S/P BILATERAL CEA  . 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 Hyperlipidemia   . Hypertension   . Nocturia   . S/P carotid endarterectomy    BILATERAL ICA--  PATENT PER DUPLEX  05-19-2012  . Squamous cell carcinoma    right calf  . Stroke [facility]) 1996   pt states no weaknes but has severe anxiety  . Urinary frequency   . Vitamin D deficiency     Past Surgical History:  Procedure Laterality Date  . APPENDECTOMY  AS CHILD  . CARDIOVASCULAR STRESS TEST  03-27-2012  DR CRENSHAW   LOW RISK LEXISCAN STUDY-- PROBABLE NORMAL PERFUSION AND SOFT TISSUE ATTENUATION/  NO ISCHEMIA/ EF 51%  . CAROTID ENDARTERECTOMY Bilateral LEFT  11-12-2008  DR GREG HAYES   RIGHT ICA  2006  ([facility])  . CYSTOSCOPY WITH LITHOLAPAXY N/A 02/26/2013   Procedure: CYSTOSCOPY WITH LITHOLAPAXY;  Surgeon: Franchot Gallo, MD;  Location: [facility];  Service: Urology;  Laterality:  N/A;  . EYE SURGERY  Jan. 2016   cataract surgery both eyes  . INGUINAL HERNIA REPAIR Right 11-08-2006  . MASS EXCISION N/A 03/03/2016   Procedure: EXCISION OF BACK  MASS;  Surgeon: Stark Klein, MD;  Location: [place];  Service: General;  Laterality: N/A;  . 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 PROSTATE SURGERY    . TRANSURETHRAL RESECTION OF PROSTATE N/A 02/26/2013   Procedure: TRANSURETHRAL RESECTION OF THE PROSTATE WITH GYRUS INSTRUMENTS;  Surgeon: Franchot Gallo, MD;  Location: [facility];  Service: Urology;  Laterality: N/A;    There were no vitals filed for this visit.  Subjective Assessment - 06/28/19 1358    Subjective  Patientr reports that he wanted to try to practice getting up from the floor, he reports that the hip is sore, very sore tody    Currently in Pain?  Yes    Pain Score  5     Pain Location  Hip    Pain Orientation  Left    Pain Descriptors / Indicators  Sore    Aggravating Factors   trying to get up from the floor  [place] Adult PT Treatment/Exercise - 06/28/19 0001      Ambulation/Gait   Gait Comments  around the fron parking island, at times needed CGA as he was a little out of control with the slope.  Had to rest      High Level Balance   High Level Balance Comments  walking in hall ball toss without stopping      Knee/Hip Exercises: Stretches   Passive Hamstring Stretch  Right;Left;5 reps;20 seconds    Piriformis Stretch  Both;5 reps;20 seconds      Knee/Hip Exercises: Aerobic   Other Aerobic  UBE level 5 x 6 minutes      Knee/Hip Exercises: Standing   Other Standing Knee Exercises  sit to stand with 6# 2x5      Knee/Hip Exercises: Seated   Marching  Both;2 sets;10 reps    Marching Weights  4 lbs.      Knee/Hip Exercises: Supine   Bridges with [employer]  20 reps    Bridges with Clamshell  20 reps    Other Supine Knee/Hip Exercises  trunk rotation with  him holding weight in both hands at the chest    Other Supine Knee/Hip Exercises  feet on ball K2c, trunk rotation, bridges and isometric abs      Modalities   Modalities  Electrical Stimulation;Moist Heat      Moist Heat Therapy   Number Minutes Moist Heat  10 Minutes    Moist Heat Location  Lumbar Spine      Electrical Stimulation   Electrical Stimulation Location  left hip and low back area    Electrical Stimulation Action  IFC    Electrical Stimulation Parameters  sitting    Electrical Stimulation Goals  Pain               PT Short Term Goals - 04/20/19 0920      PT SHORT TERM GOAL #1   Title  independent with initial HEP    Status  Partially Met        PT Long Term Goals - 06/28/19 1518      PT LONG TERM GOAL #3   Title  walk around our building without rest     Status  Partially Met            Plan - 06/28/19 1515    Clinical Impression Statement  Patient with a little more pain i th eleft hip today, he reports that he wanted to try the exercises that we did on getting up from floor, I told him this was not a good idea.  He did okay walking but the pain and fatigue limited his ability to walk    PT Next Visit Plan  walking, bed mobility and up from the floor    Consulted and Agree with Plan of Care  Patient       Patient will benefit from skilled therapeutic intervention in order to improve the following deficits and impairments:  Abnormal gait, Decreased cognition, Cardiopulmonary status limiting activity, Decreased activity tolerance, Decreased endurance, Decreased range of motion, Decreased strength, Decreased balance, Difficulty walking, Decreased safety awareness, Improper body mechanics, Decreased mobility  Visit Diagnosis: Difficulty in walking, not elsewhere classified  Muscle weakness (generalized)  Pain in left leg     Problem List Patient Active Problem List   Diagnosis Date Noted  . Lumbar spondylosis 05/02/2018  . Black head  04/28/2018  . Pain in left knee 03/09/2018  . Osteoarthritis of left  hip 01/16/2018  . Trochanteric bursitis of left hip 01/16/2018  . 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 Preventative health care 09/26/2017  . HTN (hypertension) 07/19/2015  . Hyperlipidemia 07/19/2015  . Closed fracture of fifth metacarpal bone 05/15/2015  . 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  . Great toe pain 02/11/2014  . Dementia with behavioral disturbance ([place]) 01/09/2014  . History of right MCA stroke 11/09/2013  . Obesity (BMI 30-39.9) 06/25/2013  . Renal insufficiency 06/25/2013  . Weakness of left arm 06/25/2013  . Aftercare following surgery of the circulatory system, [place] 05/17/2013  . Confusion 04/17/2012  . Sebaceous cyst 03/03/2011  . LUMBAR SPRAIN AND STRAIN 07/31/2010  . URINARY FREQUENCY 09/25/2009  . RIB PAIN, LEFT SIDED 08/29/2009  . DEPRESSION/ANXIETY 08/01/2009  . CAROTID ARTERY STENOSIS, BILATERAL 05/02/2009  . DIZZINESS 05/02/2009  . HEADACHE 05/02/2009  . ECZEMA, ATOPIC 05/31/2008  . UNSPECIFIED VITAMIN D DEFICIENCY 03/01/2008  . FATIGUE 01/26/2008  . BPH (benign prostatic hyperplasia) 08/06/2007  . ANXIETY 12/21/2006  . HOARSENESS 12/21/2006  . FASTING HYPERGLYCEMIA 12/21/2006  . Hyperlipidemia LDL goal <100 09/27/2006  . Unspecified cerebral artery occlusion with cerebral infarction 09/27/2006    Sumner Boast., PT 06/28/2019, 3:18 PM  [place], [name], [address] Phone: [phone]   Fax:  [phone]  Name: William [name] MRN: [id number] Date of Birth: 1940/09/09

## 2019-07-03 ENCOUNTER — Other Ambulatory Visit: Payer: Self-pay

## 2019-07-03 ENCOUNTER — Ambulatory Visit: Payer: PPO | Admitting: Physical Therapy

## 2019-07-03 ENCOUNTER — Encounter: Payer: Self-pay | Admitting: Physical Therapy

## 2019-07-03 DIAGNOSIS — M6281 Muscle weakness (generalized): Secondary | ICD-10-CM

## 2019-07-03 DIAGNOSIS — M79605 Pain in left leg: Secondary | ICD-10-CM

## 2019-07-03 DIAGNOSIS — R262 Difficulty in walking, not elsewhere classified: Secondary | ICD-10-CM | POA: Diagnosis not present

## 2019-07-03 NOTE — Therapy (Signed)
[place], [name], [address] Phone: [phone]   Fax:  [phone]  Physical Therapy Treatment  Patient Details  Name: William [name] MRN: [id number] Date of Birth: [birth year] Referring Provider (PT): Jaffey   Encounter Date: 07/03/2019  PT End of Session - 07/03/19 1508    Visit Number  29    Date for PT Re-Evaluation  08/03/19    PT Start Time  1355    PT Stop Time  1445    PT Time Calculation (min)  50 min    Activity Tolerance  Patient tolerated treatment well;Patient limited by pain    Behavior During Therapy  [facility] for tasks assessed/performed       Past Medical History:  Diagnosis Date  . ANXIETY   . Arthritis    low back  . Bladder stone   . BPH (benign prostatic hypertrophy)   . Carotid artery occlusion   . 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 CVD (cerebrovascular disease)   . Eczema   . GERD (gastroesophageal reflux disease)   . History of carotid artery stenosis    S/P BILATERAL CEA  . 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 Hyperlipidemia   . Hypertension   . Nocturia   . S/P carotid endarterectomy    BILATERAL ICA--  PATENT PER DUPLEX  05-19-2012  . Squamous cell carcinoma    right calf  . Stroke [facility]) 1996   pt states no weaknes but has severe anxiety  . Urinary frequency   . Vitamin D deficiency     Past Surgical History:  Procedure Laterality Date  . APPENDECTOMY  AS CHILD  . CARDIOVASCULAR STRESS TEST  03-27-2012  DR CRENSHAW   LOW RISK LEXISCAN STUDY-- PROBABLE NORMAL PERFUSION AND SOFT TISSUE ATTENUATION/  NO ISCHEMIA/ EF 51%  . CAROTID ENDARTERECTOMY Bilateral LEFT  11-12-2008  DR GREG HAYES   RIGHT ICA  2006  ([facility])  . CYSTOSCOPY WITH LITHOLAPAXY N/A 02/26/2013   Procedure: CYSTOSCOPY WITH LITHOLAPAXY;  Surgeon: Franchot Gallo, MD;  Location: [facility];  Service: Urology;  Laterality:  N/A;  . EYE SURGERY  Jan. 2016   cataract surgery both eyes  . INGUINAL HERNIA REPAIR Right 11-08-2006  . MASS EXCISION N/A 03/03/2016   Procedure: EXCISION OF BACK  MASS;  Surgeon: Stark Klein, MD;  Location: [place];  Service: General;  Laterality: N/A;  . 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 PROSTATE SURGERY    . TRANSURETHRAL RESECTION OF PROSTATE N/A 02/26/2013   Procedure: TRANSURETHRAL RESECTION OF THE PROSTATE WITH GYRUS INSTRUMENTS;  Surgeon: Franchot Gallo, MD;  Location: [facility];  Service: Urology;  Laterality: N/A;    There were no vitals filed for this visit.  Subjective Assessment - 07/03/19 1402    Subjective  Patient reports that he is feeling pretty good, has pain in the toe left foot    Currently in Pain?  Yes    Pain Score  3     Pain Location  Hip    Pain Orientation  Left    Aggravating Factors   standing         [facility] PT Assessment - 07/03/19 0001      Assessment   Medical Diagnosis  Leg weakness, leg pain , difficulty walking    Referring Provider (PT)  Jaffey                   [facility] Adult PT Treatment/Exercise - 07/03/19 0001      Transfers   Comments  worked with kneeling onto bosu using the mat and getting up x 6 , then hands on mat and lowering bottom down to the bosu and pushing back up      Ambulation/Gait   Gait Comments  in the hall to front door and back, did not want to go further due to toe soreness      Knee/Hip Exercises: Stretches   Passive Hamstring Stretch  Right;Left;5 reps;20 seconds    Piriformis Stretch  Both;5 reps;20 seconds      Knee/Hip Exercises: Standing   Hip Flexion  Both;2 sets;10 reps    Hip Flexion Limitations  4#    Hip Abduction  Both;2 sets;10 reps    Abduction Limitations  4    Hip Extension  Both;2 sets;10 reps    Extension Limitations  4      Knee/Hip Exercises: Seated   Long Arc Quad  Both;Weights;2 sets;15 reps;10 reps    Long Arc Quad  Weight  4 lbs.    Marching  Both;2 sets;10 reps    Marching Weights  4 lbs.      Knee/Hip Exercises: Supine   Bridges with Ball Squeeze  20 reps    Other Supine Knee/Hip Exercises  feet on ball K2c, trunk rotation, bridges and isometric abs               PT Short Term Goals - 04/20/19 0920      PT SHORT TERM GOAL #1   Title  independent with initial HEP    Status  Partially Met        PT Long Term Goals - 07/03/19 1510      PT LONG TERM GOAL #1   Title  Understand fall risks, the importance of exercise and flexibility to health and function    Status  Achieved      PT LONG TERM GOAL #2   Title  decrease TUG time to 16 seconds    Status  Achieved      PT LONG TERM GOAL #3   Title  walk around our building without rest     Status  Partially Met      PT LONG TERM GOAL #4   Title  decrease pain with walking by 50%    Status  Partially Met      PT LONG TERM GOAL #5   Title  increase lumbar ROM 25%    Status  Partially Met            Plan - 07/03/19 1508    Clinical Impression Statement  Overall patient is doing very well, he is moving better, and having less hip and back pain, he was limited today due to left great toe, he is seeing a podiatrist tomorrow.  He has been able to walk the dog some again, we have been working on getting up from floor if needed, this is difficult for him but he is getting better    PT Next Visit Plan  walking, bed mobility and up from the floor    Consulted and Agree with Plan of Care  Patient       Patient will benefit from skilled therapeutic intervention in order to improve the following deficits and impairments:  Abnormal gait, Decreased cognition, Cardiopulmonary status limiting activity,  Decreased activity tolerance, Decreased endurance, Decreased range of motion, Decreased strength, Decreased balance, Difficulty walking, Decreased safety awareness, Improper body mechanics, Decreased mobility  Visit Diagnosis: Difficulty  in walking, not elsewhere classified - Plan: PT plan of care cert/re-cert  Muscle weakness (generalized) - Plan: PT plan of care cert/re-cert  Pain in left leg - Plan: PT plan of care cert/re-cert     Problem List Patient Active Problem List   Diagnosis Date Noted  . Lumbar spondylosis 05/02/2018  . Black head 04/28/2018  . Pain in left knee 03/09/2018  . 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 Trochanteric bursitis of left hip 01/16/2018  . 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 Preventative health care 09/26/2017  . HTN (hypertension) 07/19/2015  . Hyperlipidemia 07/19/2015  . Closed fracture of fifth metacarpal bone 05/15/2015  . 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  . Great toe pain 02/11/2014  . Dementia with behavioral disturbance ([place]) 01/09/2014  . History of right MCA stroke 11/09/2013  . Obesity (BMI 30-39.9) 06/25/2013  . Renal insufficiency 06/25/2013  . Weakness of left arm 06/25/2013  . Aftercare following surgery of the circulatory system, [place] 05/17/2013  . Confusion 04/17/2012  . Sebaceous cyst 03/03/2011  . LUMBAR SPRAIN AND STRAIN 07/31/2010  . URINARY FREQUENCY 09/25/2009  . RIB PAIN, LEFT SIDED 08/29/2009  . DEPRESSION/ANXIETY 08/01/2009  . CAROTID ARTERY STENOSIS, BILATERAL 05/02/2009  . DIZZINESS 05/02/2009  . HEADACHE 05/02/2009  . ECZEMA, ATOPIC 05/31/2008  . UNSPECIFIED VITAMIN D DEFICIENCY 03/01/2008  . FATIGUE 01/26/2008  . BPH (benign prostatic hyperplasia) 08/06/2007  . ANXIETY 12/21/2006  . HOARSENESS 12/21/2006  . FASTING HYPERGLYCEMIA 12/21/2006  . Hyperlipidemia LDL goal <100 09/27/2006  . Unspecified cerebral artery occlusion with cerebral infarction 09/27/2006    Sumner Boast., PT 07/03/2019, 3:17 PM  [place], [name], [address] Phone: [phone]   Fax:  [phone]  Name: XAVIOR NIAZI MRN: [id number] Date of Birth: 1941/02/25

## 2019-07-05 ENCOUNTER — Ambulatory Visit: Payer: PPO | Admitting: Physical Therapy

## 2019-07-05 ENCOUNTER — Other Ambulatory Visit: Payer: Self-pay

## 2019-07-05 ENCOUNTER — Encounter: Payer: Self-pay | Admitting: Podiatry

## 2019-07-05 ENCOUNTER — Ambulatory Visit: Payer: PPO | Admitting: Podiatry

## 2019-07-05 DIAGNOSIS — M79676 Pain in unspecified toe(s): Secondary | ICD-10-CM

## 2019-07-05 DIAGNOSIS — B351 Tinea unguium: Secondary | ICD-10-CM | POA: Diagnosis not present

## 2019-07-05 DIAGNOSIS — L600 Ingrowing nail: Secondary | ICD-10-CM

## 2019-07-05 NOTE — Progress Notes (Signed)
Subjective: 79 y.o. returns the office today for painful, elongated, thickened toenails which he cannot trim himself.The left big toenail has become painful over the last 3 weeks. They have been applying neosporin to the area. No drainage or pus.  Denies any redness or drainage around the nails.  Denies any systemic complaints such as fevers, chills, nausea, vomiting.   PCP: Carollee Herter, Alferd Apa, DO  Objective: AAO 3, NAD, presents with his wife DP/PT pulses palpable, CRT less than 3 seconds  Nails hypertrophic, dystrophic, elongated, brittle, discolored x 10. There is tenderness overlying the nails 1-5 bilaterally. Incurvation to the medial and lateral portion of the left hallux toenail with pain.  Mild edema and faint erythema likely more from inflammation/infection.  Drainage or pus.  Mild incurvation of the medial right hallux toenail with any signs of infection.  This is localized to the distal medial portion. There is no surrounding erythema or drainage along the nail sites. No open lesions or pre-ulcerative lesions are identified. No pain with calf compression, swelling, warmth, erythema.  Assessment: Patient presents with symptomatic onychomycosis  Plan: -Treatment options including alternatives, risks, complications were discussed -Nails debrided x 9 without any complications or bleeding -At this time, the patient is requesting partial nail removal with chemical matricectomy to the symptomatic portion of the nail. Risks and complications were discussed with the patient for which they understand and written consent was obtained. Under sterile conditions a total of 3 mL of a mixture of 2% lidocaine plain and 0.5% Marcaine plain was infiltrated in a hallux block fashion. Once anesthetized, the skin was prepped in sterile fashion. A tourniquet was then applied. Next the medial and lateral aspect of hallux nail border was then sharply excised making sure to remove the entire offending nail  border. Once the nails were ensured to be removed area was debrided and the underlying skin was intact. There is no purulence identified in the procedure. Next phenol was then applied under standard conditions and copiously irrigated. Silvadene was applied. A dry sterile dressing was applied. After application of the dressing the tourniquet was removed and there is found to be an immediate capillary refill time to the digit. The patient tolerated the procedure well any complications. Post procedure instructions were discussed the patient for which he verbally understood. Follow-up in one week for nail check or sooner if any problems are to arise. Discussed signs/symptoms of infection and directed to call the office immediately should any occur or go directly to the emergency room. In the meantime, encouraged to call the office with any questions, concerns, changes symptoms. -Follow-up in 1 week for nail check or sooner if any problems are to arise. In the meantime, encouraged to call the office with any questions, concerns, changes symptoms.  Celesta Gentile, DPM

## 2019-07-05 NOTE — Patient Instructions (Signed)
Place 1/4 cup of epsom salts in a quart of warm tap water.  Submerge your foot or feet in the solution and soak for 20 minutes.  This soak should be done twice a day.  Next, remove your foot or feet from solution, blot dry the affected area. Apply ointment and cover if instructed by your doctor.   IF YOUR SKIN BECOMES IRRITATED WHILE USING THESE INSTRUCTIONS, IT IS OKAY TO SWITCH TO  WHITE VINEGAR AND WATER.  As another alternative soak, you may use antibacterial soap and water.  Monitor for any signs/symptoms of infection. Call the office immediately if any occur or go directly to the emergency room. Call with any questions/concerns. Long Term Care Instructions-Post Nail Surgery  You have had your ingrown toenail and root treated with a chemical.  This chemical causes a burn that will drain and ooze like a blister.  This can drain for 6-8 weeks or longer.  It is important to keep this area clean, covered, and follow the soaking instructions dispensed at the time of your surgery.  This area will eventually dry and form a scab.  Once the scab forms you no longer need to soak or apply a dressing.  If at any time you experience an increase in pain, redness, swelling, or drainage, you should contact the office as soon as possible. 

## 2019-07-10 ENCOUNTER — Encounter: Payer: Self-pay | Admitting: Physical Therapy

## 2019-07-10 ENCOUNTER — Other Ambulatory Visit: Payer: Self-pay

## 2019-07-10 ENCOUNTER — Ambulatory Visit: Payer: PPO | Admitting: Physical Therapy

## 2019-07-10 DIAGNOSIS — M79605 Pain in left leg: Secondary | ICD-10-CM

## 2019-07-10 DIAGNOSIS — R262 Difficulty in walking, not elsewhere classified: Secondary | ICD-10-CM | POA: Diagnosis not present

## 2019-07-10 DIAGNOSIS — M6281 Muscle weakness (generalized): Secondary | ICD-10-CM

## 2019-07-10 NOTE — Therapy (Signed)
[address] [place], [name], [address] Phone: [phone]   Fax:  [phone] Progress Note Reporting Period 05/24/19 to 07/10/19 for visits 11-20 See note below for Objective Data and Assessment of Progress/Goals.      Physical Therapy Treatment  Patient Details  Name: William [name] MRN: [id number] Date of Birth: Mar 13, 1941 Referring Provider (PT): Jaffey   Encounter Date: 07/10/2019  PT End of Session - 07/10/19 1435    Visit Number  30    Date for PT Re-Evaluation  08/03/19    PT Start Time  4462    PT Stop Time  1446    PT Time Calculation (min)  50 min    Activity Tolerance  Patient tolerated treatment well;Patient limited by pain    Behavior During Therapy  [facility] for tasks assessed/performed       Past Medical History:  Diagnosis Date  . ANXIETY   . Arthritis    low back  . Bladder stone   . BPH (benign prostatic hypertrophy)   . Carotid artery occlusion   . 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 CVD (cerebrovascular disease)   . Eczema   . GERD (gastroesophageal reflux disease)   . History of carotid artery stenosis    S/P BILATERAL CEA  . 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 Hyperlipidemia   . Hypertension   . Nocturia   . S/P carotid endarterectomy    BILATERAL ICA--  PATENT PER DUPLEX  05-19-2012  . Squamous cell carcinoma    right calf  . Stroke [facility]) 1996   pt states no weaknes but has severe anxiety  . Urinary frequency   . Vitamin D deficiency     Past Surgical History:  Procedure Laterality Date  . APPENDECTOMY  AS CHILD  . CARDIOVASCULAR STRESS TEST  03-27-2012  DR CRENSHAW   LOW RISK LEXISCAN STUDY-- PROBABLE NORMAL PERFUSION AND SOFT TISSUE ATTENUATION/  NO ISCHEMIA/ EF 51%  . CAROTID ENDARTERECTOMY Bilateral LEFT  11-12-2008  DR GREG HAYES   RIGHT ICA  2006  ([facility])  . CYSTOSCOPY WITH LITHOLAPAXY N/A 02/26/2013    Procedure: CYSTOSCOPY WITH LITHOLAPAXY;  Surgeon: Franchot Gallo, MD;  Location: [facility];  Service: Urology;  Laterality: N/A;  . EYE SURGERY  Jan. 2016   cataract surgery both eyes  . INGUINAL HERNIA REPAIR Right 11-08-2006  . MASS EXCISION N/A 03/03/2016   Procedure: EXCISION OF BACK  MASS;  Surgeon: Stark Klein, MD;  Location: [place];  Service: General;  Laterality: N/A;  . 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 PROSTATE SURGERY    . TRANSURETHRAL RESECTION OF PROSTATE N/A 02/26/2013   Procedure: TRANSURETHRAL RESECTION OF THE PROSTATE WITH GYRUS INSTRUMENTS;  Surgeon: Franchot Gallo, MD;  Location: [facility];  Service: Urology;  Laterality: N/A;    There were no vitals filed for this visit.  Subjective Assessment - 07/10/19 1410    Subjective  Patient reports that he is very sore with the toe, he had the toenail removed , reports that he has not been moving much and is very stiff    Currently in Pain?  Yes    Pain Score  4     Pain Location  Toe (Comment which one)    Aggravating Factors   c/o stiffness in the hips and the  back since the toe issue                       [facility] Adult PT Treatment/Exercise - 07/10/19 0001      Knee/Hip Exercises: Stretches   Passive Hamstring Stretch  Right;Left;5 reps;20 seconds    Piriformis Stretch  Both;5 reps;20 seconds      Knee/Hip Exercises: Aerobic   Other Aerobic  UBE level 5 x 6 minutes      Knee/Hip Exercises: Supine   Short Arc Quad Sets  Both;2 sets;10 reps    Short Arc Quad Sets Limitations  5#    Bridges with [employer]  20 reps    Other Supine Knee/Hip Exercises  trunk rotation with him holding weight in both hands at the chest    Other Supine Knee/Hip Exercises  feet on ball K2c, trunk rotation, bridges and isometric abs      Moist Heat Therapy   Number Minutes Moist Heat  10 Minutes    Moist Heat Location  Lumbar Spine       Electrical Stimulation   Electrical Stimulation Location  left hip and low back area    Electrical Stimulation Action  IFC    Electrical Stimulation Parameters  sitting    Electrical Stimulation Goals  Pain               PT Short Term Goals - 04/20/19 0920      PT SHORT TERM GOAL #1   Title  independent with initial HEP    Status  Partially Met        PT Long Term Goals - 07/10/19 1437      PT LONG TERM GOAL #3   Title  walk around our building without rest     Status  Partially Met            Plan - 07/10/19 1436    Clinical Impression Statement  Patient having some issues with stiff ness and soreness due to having a toenail removed last week, he has not been able to walk much and this has caused back pain, tried to do more activities where he is off his feet today    PT Next Visit Plan  progress when the toe is better    Consulted and Agree with Plan of Care  Patient       Patient will benefit from skilled therapeutic intervention in order to improve the following deficits and impairments:  Abnormal gait, Decreased cognition, Cardiopulmonary status limiting activity, Decreased activity tolerance, Decreased endurance, Decreased range of motion, Decreased strength, Decreased balance, Difficulty walking, Decreased safety awareness, Improper body mechanics, Decreased mobility  Visit Diagnosis: Difficulty in walking, not elsewhere classified  Muscle weakness (generalized)  Pain in left leg     Problem List Patient Active Problem List   Diagnosis Date Noted  . Lumbar spondylosis 05/02/2018  . Black head 04/28/2018  . Pain in left knee 03/09/2018  . 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 Trochanteric bursitis of left hip 01/16/2018  . 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 Preventative health care 09/26/2017  . HTN (hypertension) 07/19/2015  . Hyperlipidemia 07/19/2015  . Closed fracture of fifth metacarpal  bone 05/15/2015  . 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  . Great toe pain 02/11/2014  . Dementia with behavioral disturbance ([place]) 01/09/2014  . History of right MCA stroke 11/09/2013  . Obesity (BMI 30-39.9) 06/25/2013  .  Renal insufficiency 06/25/2013  . Weakness of left arm 06/25/2013  . Aftercare following surgery of the circulatory system, [place] 05/17/2013  . Confusion 04/17/2012  . Sebaceous cyst 03/03/2011  . LUMBAR SPRAIN AND STRAIN 07/31/2010  . URINARY FREQUENCY 09/25/2009  . RIB PAIN, LEFT SIDED 08/29/2009  . DEPRESSION/ANXIETY 08/01/2009  . CAROTID ARTERY STENOSIS, BILATERAL 05/02/2009  . DIZZINESS 05/02/2009  . HEADACHE 05/02/2009  . ECZEMA, ATOPIC 05/31/2008  . UNSPECIFIED VITAMIN D DEFICIENCY 03/01/2008  . FATIGUE 01/26/2008  . BPH (benign prostatic hyperplasia) 08/06/2007  . ANXIETY 12/21/2006  . HOARSENESS 12/21/2006  . FASTING HYPERGLYCEMIA 12/21/2006  . Hyperlipidemia LDL goal <100 09/27/2006  . Unspecified cerebral artery occlusion with cerebral infarction 09/27/2006    Sumner Boast., PT 07/10/2019, 2:38 PM  [address] [place], [name], [address] Phone: [phone]   Fax:  [phone]  Name: TAMARIO HEAL MRN: [id number] Date of Birth: Nov 03, 1940

## 2019-07-11 MED FILL — EZETIMIBE 10 MG TABS: 10 | 90 days supply | Qty: 90 | Fill #1

## 2019-07-12 ENCOUNTER — Ambulatory Visit (INDEPENDENT_AMBULATORY_CARE_PROVIDER_SITE_OTHER): Payer: PPO | Admitting: Podiatry

## 2019-07-12 ENCOUNTER — Encounter: Payer: Self-pay | Admitting: Podiatry

## 2019-07-12 ENCOUNTER — Ambulatory Visit: Payer: PPO | Admitting: Physical Therapy

## 2019-07-12 ENCOUNTER — Other Ambulatory Visit: Payer: Self-pay

## 2019-07-12 DIAGNOSIS — L600 Ingrowing nail: Secondary | ICD-10-CM

## 2019-07-12 NOTE — Patient Instructions (Signed)

## 2019-07-13 ENCOUNTER — Ambulatory Visit: Payer: Self-pay

## 2019-07-13 DIAGNOSIS — L600 Ingrowing nail: Secondary | ICD-10-CM | POA: Insufficient documentation

## 2019-07-13 NOTE — Progress Notes (Signed)
Subjective: William [name] is a 79 y.o.  male returns to office today for follow up evaluation after having left Hallux medial and lateral permanent nail avulsion performed. Patient has been soaking using epsom salts and applying topical antibiotic covered with bandaid daily.  Did get some occasional discomfort but overall improving.  Denies any drainage or pus.  Patient denies fevers, chills, nausea, vomiting. Denies any calf pain, chest pain, SOB.   Objective:  Vitals: Reviewed  General: Well developed, nourished, in no acute distress, alert and oriented x3   Dermatology: Skin is warm, dry and supple bilateral. LEFT hallux nail border appears to be clean, dry, with mild granular tissue and surrounding scab. There is no surrounding erythema, edema, drainage/purulence. The remaining nails appear unremarkable at this time. There are no other lesions or other signs of infection present.  Neurovascular status: Intact. No lower extremity swelling; No pain with calf compression bilateral.  Musculoskeletal: Decreased tenderness to palpation of the medial and lateral hallux nail folds. Muscular strength within normal limits bilateral.   Assesement and Plan: S/p partial nail avulsion, doing well.   -Continue soaking in epsom salts twice a day followed by antibiotic ointment and a band-aid. Can leave uncovered at night. Continue this until completely healed.  -If the area has not healed in 2 weeks, call the office for follow-up appointment, or sooner if any problems arise.  -Monitor for any signs/symptoms of infection. Call the office immediately if any occur or go directly to the emergency room. Call with any questions/concerns.  Celesta Gentile, DPM

## 2019-07-14 ENCOUNTER — Ambulatory Visit: Payer: PPO | Attending: Internal Medicine

## 2019-07-14 DIAGNOSIS — Z23 Encounter for immunization: Secondary | ICD-10-CM

## 2019-07-14 NOTE — Progress Notes (Signed)
   Covid-19 Vaccination Clinic  Name:  William [name]    MRN: [id number] DOB: Jan 25, 1941  07/14/2019  Mr. Banwart was observed post Covid-19 immunization for 15 minutes without incidence. He was provided with Vaccine Information Sheet and instruction to access the V-Safe system.   Mr. Chauvin was instructed to call 911 with any severe reactions post vaccine: [name] Difficulty breathing  . Swelling of your face and throat  . A fast heartbeat  . A bad rash all over your body  . Dizziness and weakness    Immunizations Administered    Name Date Dose VIS Date Route   Pfizer COVID-19 Vaccine 07/14/2019  8:19 AM 0.3 mL 05/25/2019 Intramuscular   Manufacturer: [place]   Lot: [id number]   [place]: [id number]

## 2019-07-16 MED FILL — LISINOPRIL-HCTZ 10-12.5 MG: 10-12.5 | 30 days supply | Qty: 30 | Fill #3

## 2019-07-17 ENCOUNTER — Ambulatory Visit: Payer: PPO | Attending: Neurology | Admitting: Physical Therapy

## 2019-07-17 ENCOUNTER — Other Ambulatory Visit: Payer: Self-pay

## 2019-07-17 ENCOUNTER — Encounter: Payer: Self-pay | Admitting: Physical Therapy

## 2019-07-17 DIAGNOSIS — M79605 Pain in left leg: Secondary | ICD-10-CM | POA: Insufficient documentation

## 2019-07-17 DIAGNOSIS — R262 Difficulty in walking, not elsewhere classified: Secondary | ICD-10-CM

## 2019-07-17 DIAGNOSIS — M6281 Muscle weakness (generalized): Secondary | ICD-10-CM | POA: Insufficient documentation

## 2019-07-17 NOTE — Therapy (Signed)
[place] [address] [place], [name], [address] Phone: [phone]   Fax:  [phone]  Physical Therapy Treatment  Patient Details  Name: William [name] MRN: [id number] Date of Birth: [birth year] Referring Provider (PT): Jaffey   Encounter Date: 07/17/2019  PT End of Session - 07/17/19 1606    Visit Number  31    Date for PT Re-Evaluation  08/03/19    PT Start Time  1526    PT Stop Time  1610    PT Time Calculation (min)  44 min    Activity Tolerance  Patient tolerated treatment well;Patient limited by pain    Behavior During Therapy  [facility] for tasks assessed/performed       Past Medical History:  Diagnosis Date  . ANXIETY   . Arthritis    low back  . Bladder stone   . BPH (benign prostatic hypertrophy)   . Carotid artery occlusion   . 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 CVD (cerebrovascular disease)   . Eczema   . GERD (gastroesophageal reflux disease)   . History of carotid artery stenosis    S/P BILATERAL CEA  . 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 Hyperlipidemia   . Hypertension   . Nocturia   . S/P carotid endarterectomy    BILATERAL ICA--  PATENT PER DUPLEX  05-19-2012  . Squamous cell carcinoma    right calf  . Stroke [facility]) 1996   pt states no weaknes but has severe anxiety  . Urinary frequency   . Vitamin D deficiency     Past Surgical History:  Procedure Laterality Date  . APPENDECTOMY  AS CHILD  . CARDIOVASCULAR STRESS TEST  03-27-2012  DR CRENSHAW   LOW RISK LEXISCAN STUDY-- PROBABLE NORMAL PERFUSION AND SOFT TISSUE ATTENUATION/  NO ISCHEMIA/ EF 51%  . CAROTID ENDARTERECTOMY Bilateral LEFT  11-12-2008  DR GREG HAYES   RIGHT ICA  2006  ([facility])  . CYSTOSCOPY WITH LITHOLAPAXY N/A 02/26/2013   Procedure: CYSTOSCOPY WITH LITHOLAPAXY;  Surgeon: Franchot Gallo, MD;  Location: [facility];  Service: Urology;  Laterality:  N/A;  . EYE SURGERY  Jan. 2016   cataract surgery both eyes  . INGUINAL HERNIA REPAIR Right 11-08-2006  . MASS EXCISION N/A 03/03/2016   Procedure: EXCISION OF BACK  MASS;  Surgeon: Stark Klein, MD;  Location: [place];  Service: General;  Laterality: N/A;  . 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 PROSTATE SURGERY    . TRANSURETHRAL RESECTION OF PROSTATE N/A 02/26/2013   Procedure: TRANSURETHRAL RESECTION OF THE PROSTATE WITH GYRUS INSTRUMENTS;  Surgeon: Franchot Gallo, MD;  Location: [facility];  Service: Urology;  Laterality: N/A;    There were no vitals filed for this visit.  Subjective Assessment - 07/17/19 1531    Subjective  Patient reports that he had a reaction to the 2nd covid shot, reports a rash and feeling "yucky"    Currently in Pain?  Yes    Pain Score  3     Pain Location  Toe (Comment which one)    Pain Orientation  Left    Pain Descriptors / Indicators  Dull;Aching                       [facility] Adult PT Treatment/Exercise - 07/17/19 0001  Knee/Hip Exercises: Stretches   Passive Hamstring Stretch  Right;Left;5 reps;20 seconds      Knee/Hip Exercises: Standing   Hip Flexion  Both;2 sets;10 reps    Hip Flexion Limitations  4#    Hip Abduction  Both;2 sets;10 reps    Abduction Limitations  4    Hip Extension  Both;2 sets;10 reps    Extension Limitations  4      Knee/Hip Exercises: Seated   Knee/Hip Flexion  on mat table overhead ball toss and catch for core and balance, weighted ball overhead press and trunk roation, tried lifting weighted ball from floor and overhead toss this caused some low back pain    Other Seated Knee/Hip Exercises  bosu behind partial sit ups      Knee/Hip Exercises: Supine   Other Supine Knee/Hip Exercises  feet on ball K2c, trunk rotation, bridges and isometric abs               PT Short Term Goals - 04/20/19 0920      PT SHORT TERM GOAL #1   Title   independent with initial HEP    Status  Partially Met        PT Long Term Goals - 07/17/19 1608      PT LONG TERM GOAL #3   Title  walk around our building without rest     Status  Partially Met      PT LONG TERM GOAL #4   Title  decrease pain with walking by 50%    Status  Partially Met            Plan - 07/17/19 1606    Clinical Impression Statement  Patient still with toe issues as well as a reaction to the 2nd covid vaccination.  We did more exercises in sitting and trying to avoid much pressure on the toe.  He tolerated well, picking up weighted ball from the floor caused some low back pain    PT Next Visit Plan  progress when the toe is better    Consulted and Agree with Plan of Care  Patient       Patient will benefit from skilled therapeutic intervention in order to improve the following deficits and impairments:  Abnormal gait, Decreased cognition, Cardiopulmonary status limiting activity, Decreased activity tolerance, Decreased endurance, Decreased range of motion, Decreased strength, Decreased balance, Difficulty walking, Decreased safety awareness, Improper body mechanics, Decreased mobility  Visit Diagnosis: Difficulty in walking, not elsewhere classified  Muscle weakness (generalized)  Pain in left leg     Problem List Patient Active Problem List   Diagnosis Date Noted  . Ingrown toenail 07/13/2019  . Lumbar spondylosis 05/02/2018  . Black head 04/28/2018  . Pain in left knee 03/09/2018  . 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 Trochanteric bursitis of left hip 01/16/2018  . 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 Preventative health care 09/26/2017  . HTN (hypertension) 07/19/2015  . Hyperlipidemia 07/19/2015  . Closed fracture of fifth metacarpal bone 05/15/2015  . 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  . Great toe pain 02/11/2014  . Dementia with behavioral disturbance ([place]) 01/09/2014  . History  of right MCA stroke 11/09/2013  . Obesity (BMI 30-39.9) 06/25/2013  . Renal insufficiency 06/25/2013  . Weakness of left arm 06/25/2013  . Aftercare following surgery of the circulatory system, [place] 05/17/2013  . Confusion 04/17/2012  . Sebaceous cyst 03/03/2011  . LUMBAR SPRAIN  AND STRAIN 07/31/2010  . URINARY FREQUENCY 09/25/2009  . RIB PAIN, LEFT SIDED 08/29/2009  . DEPRESSION/ANXIETY 08/01/2009  . CAROTID ARTERY STENOSIS, BILATERAL 05/02/2009  . DIZZINESS 05/02/2009  . HEADACHE 05/02/2009  . ECZEMA, ATOPIC 05/31/2008  . UNSPECIFIED VITAMIN D DEFICIENCY 03/01/2008  . FATIGUE 01/26/2008  . BPH (benign prostatic hyperplasia) 08/06/2007  . ANXIETY 12/21/2006  . HOARSENESS 12/21/2006  . FASTING HYPERGLYCEMIA 12/21/2006  . Hyperlipidemia LDL goal <100 09/27/2006  . Unspecified cerebral artery occlusion with cerebral infarction 09/27/2006    Sumner Boast., PT 07/17/2019, 4:09 PM  [place] [address] [place] [place], [name], [address] Phone: [phone]   Fax:  [phone]  Name: WILBERT HAYASHI MRN: [id number] Date of Birth: [birth year]

## 2019-07-19 ENCOUNTER — Ambulatory Visit: Payer: PPO | Admitting: Physical Therapy

## 2019-07-19 ENCOUNTER — Other Ambulatory Visit: Payer: Self-pay

## 2019-07-19 ENCOUNTER — Encounter: Payer: Self-pay | Admitting: Physical Therapy

## 2019-07-19 DIAGNOSIS — M6281 Muscle weakness (generalized): Secondary | ICD-10-CM

## 2019-07-19 DIAGNOSIS — R262 Difficulty in walking, not elsewhere classified: Secondary | ICD-10-CM

## 2019-07-19 DIAGNOSIS — M79605 Pain in left leg: Secondary | ICD-10-CM

## 2019-07-19 NOTE — Therapy (Signed)
[place], [name], [address] Phone: [phone]   Fax:  [phone]  Physical Therapy Treatment  Patient Details  Name: William [name] MRN: [id number] Date of Birth: [birth year] Referring Provider (PT): Jaffey   Encounter Date: 07/19/2019  PT End of Session - 07/19/19 1607    Visit Number  32    Date for PT Re-Evaluation  08/03/19    PT Start Time  9326    PT Stop Time  1613    PT Time Calculation (min)  50 min    Activity Tolerance  Patient tolerated treatment well;Patient limited by pain    Behavior During Therapy  [facility] for tasks assessed/performed       Past Medical History:  Diagnosis Date  . ANXIETY   . Arthritis    low back  . Bladder stone   . BPH (benign prostatic hypertrophy)   . Carotid artery occlusion   . 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 CVD (cerebrovascular disease)   . Eczema   . GERD (gastroesophageal reflux disease)   . History of carotid artery stenosis    S/P BILATERAL CEA  . 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 Hyperlipidemia   . Hypertension   . Nocturia   . S/P carotid endarterectomy    BILATERAL ICA--  PATENT PER DUPLEX  05-19-2012  . Squamous cell carcinoma    right calf  . Stroke [facility]) 1996   pt states no weaknes but has severe anxiety  . Urinary frequency   . Vitamin D deficiency     Past Surgical History:  Procedure Laterality Date  . APPENDECTOMY  AS CHILD  . CARDIOVASCULAR STRESS TEST  03-27-2012  DR CRENSHAW   LOW RISK LEXISCAN STUDY-- PROBABLE NORMAL PERFUSION AND SOFT TISSUE ATTENUATION/  NO ISCHEMIA/ EF 51%  . CAROTID ENDARTERECTOMY Bilateral LEFT  11-12-2008  DR GREG HAYES   RIGHT ICA  2006  ([facility])  . CYSTOSCOPY WITH LITHOLAPAXY N/A 02/26/2013   Procedure: CYSTOSCOPY WITH LITHOLAPAXY;  Surgeon: Franchot Gallo, MD;  Location: [facility];  Service: Urology;  Laterality:  N/A;  . EYE SURGERY  Jan. 2016   cataract surgery both eyes  . INGUINAL HERNIA REPAIR Right 11-08-2006  . MASS EXCISION N/A 03/03/2016   Procedure: EXCISION OF BACK  MASS;  Surgeon: Stark Klein, MD;  Location: [place];  Service: General;  Laterality: N/A;  . 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 PROSTATE SURGERY    . TRANSURETHRAL RESECTION OF PROSTATE N/A 02/26/2013   Procedure: TRANSURETHRAL RESECTION OF THE PROSTATE WITH GYRUS INSTRUMENTS;  Surgeon: Franchot Gallo, MD;  Location: [facility];  Service: Urology;  Laterality: N/A;    There were no vitals filed for this visit.  Subjective Assessment - 07/19/19 1536    Subjective  Patient able to wear a shoe today, still hurting with the toe    Currently in Pain?  Yes    Pain Score  2     Pain Location  Hip    Pain Orientation  Left    Pain Descriptors / Indicators  Aching    Pain Relieving Factors  I am feeling better                       [facility] Adult PT Treatment/Exercise - 07/19/19 0001  Knee/Hip Exercises: Stretches   Passive Hamstring Stretch  Right;Left;5 reps;20 seconds      Knee/Hip Exercises: Aerobic   Other Aerobic  UBE level 5 x 6 minutes      Knee/Hip Exercises: Standing   Hip Flexion  Both;2 sets;10 reps    Hip Flexion Limitations  4#    Hip Abduction  Both;2 sets;10 reps    Abduction Limitations  4    Hip Extension  Both;2 sets;10 reps    Extension Limitations  4    Other Standing Knee Exercises  sit to stand with 6# 2x5      Knee/Hip Exercises: Seated   Long Arc Quad  Both;Weights;2 sets;15 reps;10 reps    Long Arc Quad Weight  4 lbs.    Knee/Hip Flexion  on mat table overhead ball toss and catch for core and balance, weighted ball overhead press and trunk roation, tried lifting weighted ball from floor and overhead toss this caused some low back pain, rows and extension and trunk rotation with tband    Other Seated Knee/Hip Exercises   bosu behind partial sit ups, green tband rows and extension    Hamstring Curl  Both;2 sets;10 reps    Hamstring Limitations  green tband                PT Short Term Goals - 04/20/19 0920      PT SHORT TERM GOAL #1   Title  independent with initial HEP    Status  Partially Met        PT Long Term Goals - 07/19/19 1610      PT LONG TERM GOAL #3   Title  walk around our building without rest     Status  Partially Met            Plan - 07/19/19 1608    Clinical Impression Statement  Patient is feeling better today, he reports that he was able to do a small walk with the dog.  He reported no increase of the back paqin but still having difficulty with his toe, I can visually see him work his abs with our activity so he is starting to engage the core    PT Next Visit Plan  progress when the toe is better    Consulted and Agree with Plan of Care  Patient       Patient will benefit from skilled therapeutic intervention in order to improve the following deficits and impairments:  Abnormal gait, Decreased cognition, Cardiopulmonary status limiting activity, Decreased activity tolerance, Decreased endurance, Decreased range of motion, Decreased strength, Decreased balance, Difficulty walking, Decreased safety awareness, Improper body mechanics, Decreased mobility  Visit Diagnosis: Difficulty in walking, not elsewhere classified  Muscle weakness (generalized)  Pain in left leg     Problem List Patient Active Problem List   Diagnosis Date Noted  . Ingrown toenail 07/13/2019  . Lumbar spondylosis 05/02/2018  . Black head 04/28/2018  . Pain in left knee 03/09/2018  . 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 Trochanteric bursitis of left hip 01/16/2018  . 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 Preventative health care 09/26/2017  . HTN (hypertension) 07/19/2015  . Hyperlipidemia 07/19/2015  . Closed fracture of fifth  metacarpal bone 05/15/2015  . 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  . Great toe pain 02/11/2014  . Dementia with behavioral disturbance ([place]) 01/09/2014  . History of right MCA stroke 11/09/2013  .  Obesity (BMI 30-39.9) 06/25/2013  . Renal insufficiency 06/25/2013  . Weakness of left arm 06/25/2013  . Aftercare following surgery of the circulatory system, [place] 05/17/2013  . Confusion 04/17/2012  . Sebaceous cyst 03/03/2011  . LUMBAR SPRAIN AND STRAIN 07/31/2010  . URINARY FREQUENCY 09/25/2009  . RIB PAIN, LEFT SIDED 08/29/2009  . DEPRESSION/ANXIETY 08/01/2009  . CAROTID ARTERY STENOSIS, BILATERAL 05/02/2009  . DIZZINESS 05/02/2009  . HEADACHE 05/02/2009  . ECZEMA, ATOPIC 05/31/2008  . UNSPECIFIED VITAMIN D DEFICIENCY 03/01/2008  . FATIGUE 01/26/2008  . BPH (benign prostatic hyperplasia) 08/06/2007  . ANXIETY 12/21/2006  . HOARSENESS 12/21/2006  . FASTING HYPERGLYCEMIA 12/21/2006  . Hyperlipidemia LDL goal <100 09/27/2006  . Unspecified cerebral artery occlusion with cerebral infarction 09/27/2006    Sumner Boast., PT 07/19/2019, 4:11 PM  [place], [name], [address] Phone: [phone]   Fax:  [phone]  Name: William [name] MRN: [id number] Date of Birth: 12/09/1940

## 2019-07-24 ENCOUNTER — Encounter: Payer: Self-pay | Admitting: Physical Therapy

## 2019-07-24 ENCOUNTER — Other Ambulatory Visit: Payer: Self-pay

## 2019-07-24 ENCOUNTER — Ambulatory Visit: Payer: PPO | Admitting: Physical Therapy

## 2019-07-24 DIAGNOSIS — R262 Difficulty in walking, not elsewhere classified: Secondary | ICD-10-CM

## 2019-07-24 DIAGNOSIS — M79605 Pain in left leg: Secondary | ICD-10-CM

## 2019-07-24 DIAGNOSIS — M6281 Muscle weakness (generalized): Secondary | ICD-10-CM

## 2019-07-24 NOTE — Therapy (Signed)
[place], [name], [address] Phone: [phone]   Fax:  [phone]  Physical Therapy Treatment  Patient Details  Name: William [name] MRN: [id number] Date of Birth: Dec 06, 1940 Referring Provider (PT): Jaffey   Encounter Date: 07/24/2019  PT End of Session - 07/24/19 1430    Visit Number  33    Date for PT Re-Evaluation  08/03/19    PT Start Time  1352    PT Stop Time  1442    PT Time Calculation (min)  50 min    Activity Tolerance  Patient tolerated treatment well;Patient limited by pain    Behavior During Therapy  [facility] for tasks assessed/performed       Past Medical History:  Diagnosis Date  . ANXIETY   . Arthritis    low back  . Bladder stone   . BPH (benign prostatic hypertrophy)   . Carotid artery occlusion   . 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 CVD (cerebrovascular disease)   . Eczema   . GERD (gastroesophageal reflux disease)   . History of carotid artery stenosis    S/P BILATERAL CEA  . 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 Hyperlipidemia   . Hypertension   . Nocturia   . S/P carotid endarterectomy    BILATERAL ICA--  PATENT PER DUPLEX  05-19-2012  . Squamous cell carcinoma    right calf  . Stroke [facility]) 1996   pt states no weaknes but has severe anxiety  . Urinary frequency   . Vitamin D deficiency     Past Surgical History:  Procedure Laterality Date  . APPENDECTOMY  AS CHILD  . CARDIOVASCULAR STRESS TEST  03-27-2012  DR CRENSHAW   LOW RISK LEXISCAN STUDY-- PROBABLE NORMAL PERFUSION AND SOFT TISSUE ATTENUATION/  NO ISCHEMIA/ EF 51%  . CAROTID ENDARTERECTOMY Bilateral LEFT  11-12-2008  DR GREG HAYES   RIGHT ICA  2006  ([facility])  . CYSTOSCOPY WITH LITHOLAPAXY N/A 02/26/2013   Procedure: CYSTOSCOPY WITH LITHOLAPAXY;  Surgeon: Franchot Gallo, MD;  Location: [facility];  Service: Urology;  Laterality:  N/A;  . EYE SURGERY  Jan. 2016   cataract surgery both eyes  . INGUINAL HERNIA REPAIR Right 11-08-2006  . MASS EXCISION N/A 03/03/2016   Procedure: EXCISION OF BACK  MASS;  Surgeon: Stark Klein, MD;  Location: [place];  Service: General;  Laterality: N/A;  . 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 PROSTATE SURGERY    . TRANSURETHRAL RESECTION OF PROSTATE N/A 02/26/2013   Procedure: TRANSURETHRAL RESECTION OF THE PROSTATE WITH GYRUS INSTRUMENTS;  Surgeon: Franchot Gallo, MD;  Location[facility];  Service: Urology;  Laterality: N/A;    There were no vitals filed for this visit.  Subjective Assessment - 07/24/19 1357    Subjective  Patient reports that the toe is feeling a little better.    Currently in Pain?  Yes    Pain Score  2     Pain Location  Hip    Aggravating Factors   walking                       [facility] Adult PT Treatment/Exercise - 07/24/19 0001      Ambulation/Gait   Gait Comments  around the front parking [employer]      Knee/Hip  Exercises: Aerobic   Other Aerobic  UBE level 5 x 6 minutes      Knee/Hip Exercises: Machines for Strengthening   Cybex Knee Extension  5lb 2x15    Cybex Knee Flexion  25lb 2x15    Other Machine  seated rows 20#, lats 20# 2x10, chest press 10# 2x10, 5# on the pulleys straight arm pulls, cues for posture and to engage the core, 20# triceps      Knee/Hip Exercises: Standing   Hip Flexion  Both;2 sets;10 reps    Hip Flexion Limitations  4#    Hip Abduction  Both;2 sets;10 reps    Abduction Limitations  4    Hip Extension  Both;2 sets;10 reps    Extension Limitations  4      Knee/Hip Exercises: Seated   Long Arc Quad  Both;Weights;2 sets;15 reps;10 reps    Long Arc Quad Weight  5 lbs.    Knee/Hip Flexion  overhead press 5#, then 5# hip to hip for core    Other Seated Knee/Hip Exercises  bosu behind partial sit ups, green tband rows and extension    Hamstring Curl  Both;2  sets;10 reps    Hamstring Limitations  blue tband    Sit to Sand  15 reps;without UE support   holding 6# in hands     Knee/Hip Exercises: Supine   Bridges with [employer]  20 reps    Bridges with [employer]  20 reps    Other Supine Knee/Hip Exercises  feet on ball K2c, trunk rotation, bridges and isometric abs               PT Short Term Goals - 04/20/19 0920      PT SHORT TERM GOAL #1   Title  independent with initial HEP    Status  Partially Met        PT Long Term Goals - 07/19/19 1610      PT LONG TERM GOAL #3   Title  walk around our building without rest     Status  Partially Met            Plan - 07/24/19 1431    Clinical Impression Statement  Patient reports that he is doing a little better, trying to walk the dogs a little more.  Toe is not hurting as much, he still has pain in the back with the walking, he did c/o some dizziness after the walk    PT Next Visit Plan  try to walk nore    Consulted and Agree with Plan of Care  Patient       Patient will benefit from skilled therapeutic intervention in order to improve the following deficits and impairments:  Abnormal gait, Decreased cognition, Cardiopulmonary status limiting activity, Decreased activity tolerance, Decreased endurance, Decreased range of motion, Decreased strength, Decreased balance, Difficulty walking, Decreased safety awareness, Improper body mechanics, Decreased mobility  Visit Diagnosis: Difficulty in walking, not elsewhere classified  Muscle weakness (generalized)  Pain in left leg     Problem List Patient Active Problem List   Diagnosis Date Noted  . Ingrown toenail 07/13/2019  . Lumbar spondylosis 05/02/2018  . Black head 04/28/2018  . Pain in left knee 03/09/2018  . 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 Trochanteric bursitis of left hip 01/16/2018  . 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 Preventative health care  09/26/2017  . HTN (hypertension) 07/19/2015  . Hyperlipidemia 07/19/2015  .  Closed fracture of fifth metacarpal bone 05/15/2015  . 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  . Great toe pain 02/11/2014  . Dementia with behavioral disturbance ([place]) 01/09/2014  . History of right MCA stroke 11/09/2013  . Obesity (BMI 30-39.9) 06/25/2013  . Renal insufficiency 06/25/2013  . Weakness of left arm 06/25/2013  . Aftercare following surgery of the circulatory system, [place] 05/17/2013  . Confusion 04/17/2012  . Sebaceous cyst 03/03/2011  . LUMBAR SPRAIN AND STRAIN 07/31/2010  . URINARY FREQUENCY 09/25/2009  . RIB PAIN, LEFT SIDED 08/29/2009  . DEPRESSION/ANXIETY 08/01/2009  . CAROTID ARTERY STENOSIS, BILATERAL 05/02/2009  . DIZZINESS 05/02/2009  . HEADACHE 05/02/2009  . ECZEMA, ATOPIC 05/31/2008  . UNSPECIFIED VITAMIN D DEFICIENCY 03/01/2008  . FATIGUE 01/26/2008  . BPH (benign prostatic hyperplasia) 08/06/2007  . ANXIETY 12/21/2006  . HOARSENESS 12/21/2006  . FASTING HYPERGLYCEMIA 12/21/2006  . Hyperlipidemia LDL goal <100 09/27/2006  . Unspecified cerebral artery occlusion with cerebral infarction 09/27/2006    Sumner Boast., PT 07/24/2019, 2:34 PM  [place], [name], [address] Phone: [phone]   Fax:  [phone]  Name: William [name] MRN: [id number] Date of Birth: [birth year]

## 2019-07-25 ENCOUNTER — Telehealth: Payer: Self-pay | Admitting: Family Medicine

## 2019-07-25 NOTE — Chronic Care Management (AMB) (Signed)
  Chronic Care Management   Note  07/25/2019 Name: SANDEEP RADELL MRN: [id number] DOB: 1940/07/22  Marily Lente Spengler is a 79 y.o. year old male who is a primary care patient of Ann Held, DO. I reached out to Bruna Potter by phone today in response to a referral sent by Mr. Marily Lente Ericsson's PCP, Carollee Herter, Alferd Apa, DO. Patient's wife, [name] gave verbal consent. She will be assisting with his visit.  Mr. Eifler was given information about Chronic Care Management services today including:  1. CCM service includes personalized support from designated clinical staff supervised by his physician, including individualized plan of care and coordination with other care providers 2. 24/7 contact phone numbers for assistance for urgent and routine care needs. 3. Service will only be billed when office clinical staff spend 20 minutes or more in a month to coordinate care. 4. Only one practitioner may furnish and bill the service in a calendar month. 5. The patient may stop CCM services at any time (effective at the end of the month) by phone call to the office staff. 6. The patient will be responsible for cost sharing (co-pay) of up to 20% of the service fee (after annual deductible is met).  Patient agreed to services and verbal consent obtained.   Follow up plan:   Raynicia Dukes [employer] Scheduler

## 2019-07-26 ENCOUNTER — Ambulatory Visit: Payer: PPO | Admitting: Physical Therapy

## 2019-07-26 ENCOUNTER — Other Ambulatory Visit: Payer: Self-pay

## 2019-07-26 ENCOUNTER — Encounter: Payer: Self-pay | Admitting: Physical Therapy

## 2019-07-26 DIAGNOSIS — E785 Hyperlipidemia, unspecified: Secondary | ICD-10-CM

## 2019-07-26 DIAGNOSIS — M79605 Pain in left leg: Secondary | ICD-10-CM

## 2019-07-26 DIAGNOSIS — M6281 Muscle weakness (generalized): Secondary | ICD-10-CM

## 2019-07-26 DIAGNOSIS — R262 Difficulty in walking, not elsewhere classified: Secondary | ICD-10-CM

## 2019-07-26 DIAGNOSIS — I10 Essential (primary) hypertension: Secondary | ICD-10-CM

## 2019-07-26 NOTE — Therapy (Signed)
[place], [name], [address] Phone: [phone]   Fax:  [phone]  Physical Therapy Treatment  Patient Details  Name: William [name] MRN: [id number] Date of Birth: [birth year] Referring Provider (PT): Jaffey   Encounter Date: 07/26/2019  PT End of Session - 07/26/19 1447    Visit Number  34    Date for PT Re-Evaluation  08/03/19    PT Start Time  3536    PT Stop Time  1447    PT Time Calculation (min)  50 min    Activity Tolerance  Patient tolerated treatment well    Behavior During Therapy  [facility] for tasks assessed/performed       Past Medical History:  Diagnosis Date  . ANXIETY   . Arthritis    low back  . Bladder stone   . BPH (benign prostatic hypertrophy)   . Carotid artery occlusion   . 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 CVD (cerebrovascular disease)   . Eczema   . GERD (gastroesophageal reflux disease)   . History of carotid artery stenosis    S/P BILATERAL CEA  . 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 Hyperlipidemia   . Hypertension   . Nocturia   . S/P carotid endarterectomy    BILATERAL ICA--  PATENT PER DUPLEX  05-19-2012  . Squamous cell carcinoma    right calf  . Stroke [facility]) 1996   pt states no weaknes but has severe anxiety  . Urinary frequency   . Vitamin D deficiency     Past Surgical History:  Procedure Laterality Date  . APPENDECTOMY  AS CHILD  . CARDIOVASCULAR STRESS TEST  03-27-2012  DR CRENSHAW   LOW RISK LEXISCAN STUDY-- PROBABLE NORMAL PERFUSION AND SOFT TISSUE ATTENUATION/  NO ISCHEMIA/ EF 51%  . CAROTID ENDARTERECTOMY Bilateral LEFT  11-12-2008  DR GREG HAYES   RIGHT ICA  2006  ([facility])  . CYSTOSCOPY WITH LITHOLAPAXY N/A 02/26/2013   Procedure: CYSTOSCOPY WITH LITHOLAPAXY;  Surgeon: Franchot Gallo, MD;  Location: [facility];  Service: Urology;  Laterality: N/A;  . EYE SURGERY   Jan. 2016   cataract surgery both eyes  . INGUINAL HERNIA REPAIR Right 11-08-2006  . MASS EXCISION N/A 03/03/2016   Procedure: EXCISION OF BACK  MASS;  Surgeon: Stark Klein, MD;  Location: [place] [place];  Service: General;  Laterality: N/A;  . 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 PROSTATE SURGERY    . TRANSURETHRAL RESECTION OF PROSTATE N/A 02/26/2013   Procedure: TRANSURETHRAL RESECTION OF THE PROSTATE WITH GYRUS INSTRUMENTS;  Surgeon: Franchot Gallo, MD;  Location[facility];  Service: Urology;  Laterality: N/A;    There were no vitals filed for this visit.  Subjective Assessment - 07/26/19 1402    Subjective  Reports that he is doing well, a little slow    Currently in Pain?  Yes    Pain Score  3     Pain Location  Back    Pain Orientation  Left;Lower    Pain Descriptors / Indicators  Aching                       [facility] Adult PT Treatment/Exercise - 07/26/19 0001      High Level Balance   High Level Balance Comments  walking in hall  ball toss without stopping, cues for big steps and to not stop, some ball kicking, looking up at the lights with a high toss made him dizzy, seated ball tap backs working on sitting balance      Knee/Hip Exercises: Aerobic   Other Aerobic  UBE level 5 x 6 minutes      Knee/Hip Exercises: Machines for Strengthening   Other Machine  seated rows 20#, lats 20# 2x10, chest press 10# 2x10, 5# on the pulleys straight arm pulls, cues for posture and to engage the core, 20# triceps      Knee/Hip Exercises: Standing   Hip Flexion  Both;2 sets;10 reps    Hip Flexion Limitations  4#    Hip Abduction  Both;2 sets;10 reps    Abduction Limitations  4    Hip Extension  Both;2 sets;10 reps    Extension Limitations  4      Knee/Hip Exercises: Seated   Long Arc Quad  Both;Weights;2 sets;15 reps;10 reps    Knee/Hip Flexion  overhead press 5#, then 5# hip to hip for core, 6# biceps, 3# alternating  punches    Other Seated Knee/Hip Exercises  bosu behind partial sit ups, green tband rows and extension               PT Short Term Goals - 04/20/19 0920      PT SHORT TERM GOAL #1   Title  independent with initial HEP    Status  Partially Met        PT Long Term Goals - 07/26/19 1449      PT LONG TERM GOAL #3   Title  walk around our building without rest     Status  Partially Met      PT LONG TERM GOAL #4   Title  decrease pain with walking by 50%    Status  Partially Met      PT LONG TERM GOAL #5   Title  increase lumbar ROM 25%    Status  Achieved            Plan - 07/26/19 1447    Clinical Impression Statement  Spoke with patient and his wife about [place] looking to discharge in the near future, they were not able to get appointments here next week due to our busy schedule.  I will extend the period next week and see them and looke to d/c with an advanced HEP over this next period    PT Next Visit Plan  start to educate wife and patient on the HEP    Consulted and Agree with Plan of Care  Patient       Patient will benefit from skilled therapeutic intervention in order to improve the following deficits and impairments:  Abnormal gait, Decreased cognition, Cardiopulmonary status limiting activity, Decreased activity tolerance, Decreased endurance, Decreased range of motion, Decreased strength, Decreased balance, Difficulty walking, Decreased safety awareness, Improper body mechanics, Decreased mobility  Visit Diagnosis: Difficulty in walking, not elsewhere classified  Muscle weakness (generalized)  Pain in left leg     Problem List Patient Active Problem List   Diagnosis Date Noted  . Ingrown toenail 07/13/2019  . Lumbar spondylosis 05/02/2018  . Black head 04/28/2018  . Pain in left knee 03/09/2018  . 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 Trochanteric bursitis of left hip 01/16/2018  . Squamous cell carcinoma in situ (SCCIS) of skin of right  lower leg 09/26/2017  . Preventative health care 09/26/2017  .  HTN (hypertension) 07/19/2015  . Hyperlipidemia 07/19/2015  . Closed fracture of fifth metacarpal bone 05/15/2015  . 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  . Great toe pain 02/11/2014  . Dementia with behavioral disturbance ([place]) 01/09/2014  . History of right MCA stroke 11/09/2013  . Obesity (BMI 30-39.9) 06/25/2013  . Renal insufficiency 06/25/2013  . Weakness of left arm 06/25/2013  . Aftercare following surgery of the circulatory system, [place] 05/17/2013  . Confusion 04/17/2012  . Sebaceous cyst 03/03/2011  . LUMBAR SPRAIN AND STRAIN 07/31/2010  . URINARY FREQUENCY 09/25/2009  . RIB PAIN, LEFT SIDED 08/29/2009  . DEPRESSION/ANXIETY 08/01/2009  . CAROTID ARTERY STENOSIS, BILATERAL 05/02/2009  . DIZZINESS 05/02/2009  . HEADACHE 05/02/2009  . ECZEMA, ATOPIC 05/31/2008  . UNSPECIFIED VITAMIN D DEFICIENCY 03/01/2008  . FATIGUE 01/26/2008  . BPH (benign prostatic hyperplasia) 08/06/2007  . ANXIETY 12/21/2006  . HOARSENESS 12/21/2006  . FASTING HYPERGLYCEMIA 12/21/2006  . Hyperlipidemia LDL goal <100 09/27/2006  . Unspecified cerebral artery occlusion with cerebral infarction 09/27/2006    Sumner Boast., PT 07/26/2019, 2:50 PM  [place], [name], [address] Phone: [phone]   Fax:  [phone]  Name: William [name] MRN: [id number] Date of Birth: [birth year]

## 2019-07-30 ENCOUNTER — Ambulatory Visit (INDEPENDENT_AMBULATORY_CARE_PROVIDER_SITE_OTHER): Payer: PPO | Admitting: Family Medicine

## 2019-07-30 ENCOUNTER — Encounter: Payer: Self-pay | Admitting: Family Medicine

## 2019-07-30 ENCOUNTER — Other Ambulatory Visit: Payer: Self-pay

## 2019-07-30 ENCOUNTER — Ambulatory Visit: Payer: PPO | Admitting: Pharmacist

## 2019-07-30 VITALS — BP 122/78 | HR 67 | Temp 96.3°F | Ht 70.5 in | Wt 219.1 lb

## 2019-07-30 DIAGNOSIS — N401 Enlarged prostate with lower urinary tract symptoms: Secondary | ICD-10-CM

## 2019-07-30 DIAGNOSIS — N289 Disorder of kidney and ureter, unspecified: Secondary | ICD-10-CM

## 2019-07-30 DIAGNOSIS — E785 Hyperlipidemia, unspecified: Secondary | ICD-10-CM | POA: Diagnosis not present

## 2019-07-30 DIAGNOSIS — E559 Vitamin D deficiency, unspecified: Secondary | ICD-10-CM

## 2019-07-30 DIAGNOSIS — I10 Essential (primary) hypertension: Secondary | ICD-10-CM

## 2019-07-30 DIAGNOSIS — M1612 Unilateral primary osteoarthritis, left hip: Secondary | ICD-10-CM

## 2019-07-30 DIAGNOSIS — R3911 Hesitancy of micturition: Secondary | ICD-10-CM

## 2019-07-30 DIAGNOSIS — Z8673 Personal history of transient ischemic attack (TIA), and cerebral infarction without residual deficits: Secondary | ICD-10-CM

## 2019-07-30 LAB — COMPREHENSIVE METABOLIC PANEL
ALT: 19 U/L (ref 0–53)
AST: 20 U/L (ref 0–37)
Albumin: 4.1 g/dL (ref 3.5–5.2)
Alkaline Phosphatase: 56 U/L (ref 39–117)
BUN: 28 mg/dL — ABNORMAL HIGH (ref 6–23)
CO2: 26 mEq/L (ref 19–32)
Calcium: 9.3 mg/dL (ref 8.4–10.5)
Chloride: 105 mEq/L (ref 96–112)
Creatinine, Ser: 1.77 mg/dL — ABNORMAL HIGH (ref 0.40–1.50)
GFR: 37.49 mL/min — ABNORMAL LOW (ref >60.00)
Glucose, Bld: 97 mg/dL (ref 70–99)
Potassium: 4.1 mEq/L (ref 3.5–5.1)
Sodium: 140 mEq/L (ref 135–145)
Total Bilirubin: 0.4 mg/dL (ref 0.2–1.2)
Total Protein: 7.2 g/dL (ref 6.0–8.3)

## 2019-07-30 LAB — LIPID PANEL
Cholesterol: 166 mg/dL (ref 0–200)
HDL: 56 mg/dL (ref >39.00)
LDL Cholesterol: 94 mg/dL (ref 0–99)
NonHDL: 110.46
Total CHOL/HDL Ratio: 3
Triglycerides: 83 mg/dL (ref 0.0–149.0)
VLDL: 16.6 mg/dL (ref 0.0–40.0)

## 2019-07-30 MED FILL — TAMSULOSIN HCL 0.4 MG CAP: 0.4 | 90 days supply | Qty: 90 | Fill #2

## 2019-07-30 NOTE — Assessment & Plan Note (Signed)
Tolerating statin, encouraged heart healthy diet, avoid trans fats, minimize simple carbs and saturated fats. Increase exercise as tolerated 

## 2019-07-30 NOTE — Assessment & Plan Note (Signed)
Well controlled, no changes to meds. Encouraged heart healthy diet such as the DASH diet and exercise as tolerated.  °

## 2019-07-30 NOTE — Patient Instructions (Signed)

## 2019-07-30 NOTE — Chronic Care Management (AMB) (Signed)
Chronic Care Management Pharmacy  Name: KACHE MCCLURG  MRN: [id number] DOB: [birth year]   Chief Complaint/ HPI  William [name],  79 y.o. , male presents for their Initial CCM visit with the clinical pharmacist via in office visit.  PCP : Ann Held, DO  Their chronic conditions include: HTN, HLD, Hx of Stroke, Anxiety, Osteoarthritis, Insomnia, BPH, GERD, Vitamin D Deficiency  Office Visits: 01/26/19: Office visit w/ Dr. Etter Sjogren - Refilled flomax (still taking per urology), labs ordered (Lipid, CBC, CMP)  Consult Visits: 2/11,9,4,2/21: PT Treatment with Lum Babe - Pt tolerated well  07/12/19: Podiatry w/ Dr. Jacqualyn Posey - Ingrown toenail removal F/U. Pt doing well. Continue soaking in epsom salts BID followed by abx ointment and bandaid. Can leave uncovered at night.  07/10/19: PT Treatment with Lum Babe - Pt tolerated well  07/05/19: Podiatry w/ Dr. Jacqualyn Posey - Ingrown toenail removed.   05/29/19: Neurology w/ Dr. Tomi Likens - MRI of cervical spine from 03/10/19 showed some cervical degenerative disc disease and spondylosis with mild foraminal narrowing, but not spinal stenosis or compressive myelopathy. Pt receiving PT and shots in low back and hip. Reorder of PT  05/01/19: Nephrology with Jamal Maes: Stage 3   04/11/19: Psych w/ Dr. Casimiro Needle - Labs ordered for future to rule out causes of fatigue (TSH, CBC, CMP, Tegretol blood level)  02/13/19: Neurology w/ Dr. Tomi Likens - No elctrodiagnostic evidence of lumbosacral radiculopathy or myopathy. MRI of cervical spine ordered.    Medications: Outpatient Encounter Medications as of 07/30/2019  Medication Sig Note  . acetaminophen (TYLENOL) 500 MG tablet Take 500 mg by mouth every 6 (six) hours as needed (1 tab as needed).   [name] amLODipine (NORVASC) 5 MG tablet TAKE 1 TABLET (5 MG TOTAL) BY MOUTH DAILY.   [name] aspirin 325 MG EC tablet Take 325 mg by mouth daily.   . carbamazepine (TEGRETOL-XR) 100 MG 12 hr tablet 1 qam  1  qhs    . Cholecalciferol (VITAMIN D) 2000 UNITS CAPS Take by mouth.   . docusate sodium (COLACE) 100 MG capsule Take 100 mg by mouth daily.   . Eszopiclone 3 MG TABS TAKE 1 TABLET BY MOUTH AT BEDTIME AS NEEDED TAKE IMMEDIATELY BEFORE BEDTIME   . ezetimibe (ZETIA) 10 MG tablet TAKE 1 TABLET (10 MG TOTAL) BY MOUTH DAILY.   . famotidine (PEPCID) 20 MG tablet Take 20 mg by mouth 2 (two) times daily.   . fenofibrate 160 MG tablet TAKE 1 TABLET BY MOUTH DAILY. REPEAT LABS ARE DUE NOW   . Flaxseed, Linseed, (FLAX SEED OIL PO) Take by mouth daily.     . fluticasone (CUTIVATE) 0.05 % cream    . lisinopril-hydrochlorothiazide (ZESTORETIC) 10-12.5 MG tablet Take 1 tablet by mouth daily.   [name] LORazepam (ATIVAN) 0.5 MG tablet 2  Bid  1 as needed   . Multiple Vitamin (MULTIVITAMIN) tablet Take 1 tablet by mouth daily. 07/30/2019: Chewable gummie from [facility]  . NONFORMULARY OR COMPOUNDED ITEM [facility]:  Antifungal topical - Terbinafine 3%, Fluconazole 2%, Tea Tree Oil 5%, Urea 10/%,, Ibuprofen 2% in DMSO Suspension #25ml. Apply to the affected nail(s) once (at bedtime) or twice daily.   . tamsulosin (FLOMAX) 0.4 MG CAPS capsule Take 1 capsule (0.4 mg total) by mouth daily.   . traMADol (ULTRAM) 50 MG tablet Take 1 tablet (50 mg total) by mouth every 8 (eight) hours as needed.   . NONFORMULARY OR COMPOUNDED ITEM [facility]:  Onychomycosis Nail lacquer -  Fluconazole 2%, Terbinafine 1%, DMSO, apply to affected area daily. (Patient not taking: Reported on 07/30/2019)   . NONFORMULARY OR COMPOUNDED ITEM Compression socks  #1  Dx low ext edema and ache (Patient not taking: Reported on 07/30/2019) 07/30/2019: [name] reports they have the stockings, but have not needed them  . Zoster Vaccine Adjuvanted [facility]) injection 0.5 ml IM x1 repeat in 2 months    No facility-administered encounter medications on file as of 07/30/2019.   Immunization History  Administered Date(s) Administered  . Fluad Quad(high Dose  65+) 02/15/2019  . Influenza Split 03/23/2011, 03/13/2012  . Influenza Whole 04/20/2007, 03/01/2008, 04/01/2009, 03/27/2010  . Influenza, High Dose Seasonal PF 03/27/2018  . Influenza,inj,Quad PF,6+ Mos 04/03/2013, 03/06/2014, 03/03/2015, 04/20/2016, 02/23/2017  . PFIZER SARS-COV-2 Vaccination 06/23/2019, 07/14/2019  . Pneumococcal Conjugate-13 06/25/2013  . Pneumococcal Polysaccharide-23 05/30/2009  . Tdap 10/19/2011  . Zoster 05/30/2009  . Zoster Recombinat (Shingrix) 12/26/2018, 04/17/2019     Current Diagnosis/Assessment:  Goals Addressed            This Visit's Progress   . BP Goal <140/90      . Check blood pressure 3-4 times per week      . Get Vitamin D drawn at next office visit      . Pharmacy Care Plan       Current Barriers:  . Chronic Disease Management support, education, and care coordination needs related to HTN, HLD, Hx of Stroke, Anxiety, Osteoarthritis, Insomnia, BPH, GERD  Pharmacist Clinical Goal(s):  . BP Goal <140/90  Interventions: . Comprehensive medication review performed. . Check Blood Pressure 3-4 times per week . Get Vitamin D drawn at next office visit  Patient Self Care Activities:  . Patient's wife [name]) manages most things for patient  Initial goal documentation       Social Hx 4 children and 5 grandchildren. Wife [name] is present during visit and provides history for the patient.    Hypertension   CMP Latest Ref Rng & Units 04/25/2019 01/26/2019 07/18/2018  Glucose 65 - 99 mg/dL 127(H) 98 98  BUN 8 - 27 mg/dL 30(H) 40(H) 29(H)  Creatinine 0.76 - 1.27 mg/dL 2.13(H) 2.04(H) 1.66(H)  Sodium 134 - 144 mmol/L 142 140 139  Potassium 3.5 - 5.2 mmol/L 4.0 4.1 4.1  Chloride 96 - 106 mmol/L 103 103 103  CO2 20 - 29 mmol/L 22 28 25   Calcium 8.6 - 10.2 mg/dL 10.1 9.7 9.7  Total Protein 6.0 - 8.5 g/dL 7.4 7.1 -  Total Bilirubin 0.0 - 1.2 mg/dL 0.3 0.4 -  Alkaline Phos 39 - 117 IU/L 57 50 -  AST 0 - 40 IU/L 31 26 -  ALT 0 - 44 IU/L 28  27 -  GFR      29   31                      40  CMP being drawn today  BP today is:  <130/80 (122/78) at office visit with Dr. Etter Sjogren today prior to current visit.   Office blood pressures are  BP Readings from Last 3 Encounters:  07/30/19 122/78  04/11/19 130/82  02/13/19 130/73    Patient has failed these meds in the past: None noted  Patient is currently controlled on the following medications: amlodipine 5mg  daily, lisinopril-hctz 10-12.5mg  daily  Patient checks BP at home infrequently  We discussed: how to properly measure blood pressure.  Plan -Check blood pressure 3-4 times a week  -  Continue current medications    Hyperlipidemia   Lipid Panel     Component Value Date/Time   CHOL 178 01/26/2019 0930   TRIG 98.0 01/26/2019 0930   HDL 54.50 01/26/2019 0930   CHOLHDL 3 01/26/2019 0930   VLDL 19.6 01/26/2019 0930   LDLCALC 104 (H) 01/26/2019 0930     ASCVD 10-year risk: Hx of ASCVD  LDL goal <100, ideally <70  Patient has failed these meds in past: rosuvastatin 40mg , pitavastatin 2mg   Patient is currently uncontrolled on the following medications: ezetimibe 10mg  daily, fenofibrate 160mg  daily  Lipid panel being drawn today  We discussed:  Risk/benefit of statin therapy noting pt's hx of stroke vs his intolerance to statin therapy. 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 Only 2 tramadol per month  Plan -Continue current medications   Insomnia    Patient has failed these meds in past: None Patient is currently controlled on the following medications: eszopiclone 3mg  HS  Gets up in the middle of the night even while on current therapy to urinate.  Onset Difficulty: No Latency Difficulty: Yes  We discussed:  Insomnia d/t urination/BPH vs regular insomnia  Plan -Continue current medications   BPH    Patient has failed these meds in past: None noted Patient is currently controlled on the following medications: tamsulosin 0.4mg  daily  Overnight Urination Trips: 2 Trouble Starting Stream: No Trouble Emptying Bladder: No  Last Urologist Appt: [name] reports last visit was about 6 months ago and pt does not have to follow up for 2 years   Plan -Continue current medications  GERD    Patient has failed these meds in past: Nexium (taken off due to renal decline)  Patient is currently controlled on the following medications: famotidine 20mg  BID  Uses almost every Nuria Phebus usually once daily   Breakthrough Symptoms:2-3 times per month Breakthrough Tx: Tums Trigger: Spicy foods ([place]), overeating   Plan -Avoid GERD triggers -Continue current medications  Vitamin D Deficiency   09/09/2008: Vitamin D, 25-hydroxy = 57  Patient has failed these meds in past: None noted Patient is currently controlled (but need updated labs) on the following medications: Cholecalciferol 2000 units daily   Plan -Get vitamin D drawn at next office visit -Continue current medications   Miscellaneous Medications Fluticasone cream once a month for psoriasis

## 2019-07-30 NOTE — Progress Notes (Signed)
Patient ID: William [name], male    DOB: 1940/12/18  Age: 79 y.o. MRN: [id number]    Subjective:  Subjective  HPI William [name] presents for f/u bp and cholesterol   No complaints   His wife is present too.    Review of Systems  Constitutional: Negative for appetite change, diaphoresis, fatigue, fever and unexpected weight change.  HENT: Negative for congestion.   Eyes: Negative for pain, redness and visual disturbance.  Respiratory: Negative for cough, chest tightness, shortness of breath and wheezing.   Cardiovascular: Negative for chest pain, palpitations and leg swelling.  Gastrointestinal: Negative for vomiting.  Endocrine: Negative for cold intolerance, heat intolerance, polydipsia, polyphagia and polyuria.  Genitourinary: Negative for difficulty urinating, dysuria and frequency.  Musculoskeletal: Negative for back pain.  Skin: Negative for rash.  Neurological: Negative for dizziness, light-headedness, numbness and headaches.    History Past Medical History:  Diagnosis Date  . ANXIETY   . Arthritis    low back  . Bladder stone   . BPH (benign prostatic hypertrophy)   . Carotid artery occlusion   . 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 CVD (cerebrovascular disease)   . Eczema   . GERD (gastroesophageal reflux disease)   . History of carotid artery stenosis    S/P BILATERAL CEA  . 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 Hyperlipidemia   . Hypertension   . Nocturia   . S/P carotid endarterectomy    BILATERAL ICA--  PATENT PER DUPLEX  05-19-2012  . Squamous cell carcinoma    right calf  . Stroke [facility]) 1996   pt states no weaknes but has severe anxiety  . Urinary frequency   . Vitamin D deficiency     He has a past surgical history that includes Carotid endarterectomy (Bilateral, LEFT  11-12-2008  DR GREG HAYES); Inguinal hernia repair (Right, 11-08-2006); Cardiovascular stress test (03-27-2012  DR CRENSHAW);  Appendectomy (AS CHILD); Cystoscopy with litholapaxy (N/A, 02/26/2013); Transurethral resection of prostate (N/A, 02/26/2013); Prostate surgery; Mohs surgery (Left, 1/ 2016); Eye surgery (Jan. 2016); and Mass excision (N/A, 03/03/2016).   His family history includes Bipolar disorder in his mother; Heart disease in his father and mother.He reports that he quit smoking about 14 years ago. His smoking use included cigarettes. He has a 80.00 pack-year smoking history. He has never used smokeless tobacco. He reports current alcohol use. He reports that he does not use drugs.  Current Outpatient Medications on File Prior to Visit  Medication Sig Dispense Refill  . acetaminophen (TYLENOL) 500 MG tablet Take 500 mg by mouth every 6 (six) hours as needed (1 tab as needed).    [name] amLODipine (NORVASC) 5 MG tablet TAKE 1 TABLET (5 MG TOTAL) BY MOUTH DAILY. 90 tablet 1  . aspirin 325 MG EC tablet Take 325 mg by mouth daily.    . carbamazepine (TEGRETOL-XR) 100 MG 12 hr tablet 1 qam  1  qhs 180 tablet 2  . Cholecalciferol (VITAMIN D) 2000 UNITS CAPS Take by mouth.    . Eszopiclone 3 MG TABS TAKE 1 TABLET BY MOUTH AT BEDTIME AS NEEDED TAKE IMMEDIATELY BEFORE BEDTIME 90 tablet 2  . ezetimibe (ZETIA) 10 MG tablet TAKE 1 TABLET (10 MG TOTAL) BY MOUTH DAILY. 90 tablet 3  . famotidine (PEPCID) 20 MG tablet Take 20 mg by mouth 2 (two) times daily.    . fenofibrate 160 MG tablet TAKE 1 TABLET BY  MOUTH DAILY. REPEAT LABS ARE DUE NOW 90 tablet 1  . Flaxseed, Linseed, (FLAX SEED OIL PO) Take by mouth daily.      . fluticasone (CUTIVATE) 0.05 % cream   3  . lisinopril-hydrochlorothiazide (ZESTORETIC) 10-12.5 MG tablet Take 1 tablet by mouth daily.    [name] LORazepam (ATIVAN) 0.5 MG tablet 2  Bid  1 as needed 150 tablet 5  . Multiple Vitamin (MULTIVITAMIN) tablet Take 1 tablet by mouth daily.    . NONFORMULARY OR COMPOUNDED ITEM [facility]:  Onychomycosis Nail lacquer - Fluconazole 2%, Terbinafine 1%, DMSO, apply to  affected area daily. (Patient not taking: Reported on 07/30/2019) 120 each 2  . NONFORMULARY OR COMPOUNDED ITEM [facility]:  Antifungal topical - Terbinafine 3%, Fluconazole 2%, Tea Tree Oil 5%, Urea 10/%,, Ibuprofen 2% in DMSO Suspension #18ml. Apply to the affected nail(s) once (at bedtime) or twice daily. 100 each 2  . NONFORMULARY OR COMPOUNDED ITEM Compression socks  #1  Dx low ext edema and ache (Patient not taking: Reported on 07/30/2019) 1 each 0  . tamsulosin (FLOMAX) 0.4 MG CAPS capsule Take 1 capsule (0.4 mg total) by mouth daily. 90 capsule 3  . traMADol (ULTRAM) 50 MG tablet Take 1 tablet (50 mg total) by mouth every 8 (eight) hours as needed. 30 tablet 0  . Zoster Vaccine Adjuvanted [facility]) injection 0.5 ml IM x1 repeat in 2 months 0.5 mL 1   No current facility-administered medications on file prior to visit.     Objective:  Objective  Physical Exam Vitals and nursing note reviewed.  Constitutional:      General: He is sleeping.     Appearance: He is well-developed.  HENT:     Head: Normocephalic and atraumatic.  Eyes:     Pupils: Pupils are equal, round, and reactive to light.  Neck:     Thyroid: No thyromegaly.  Cardiovascular:     Rate and Rhythm: Normal rate and regular rhythm.     Heart sounds: No murmur.  Pulmonary:     Effort: Pulmonary effort is normal. No respiratory distress.     Breath sounds: Normal breath sounds. No wheezing or rales.  Chest:     Chest wall: No tenderness.  Musculoskeletal:        General: No tenderness.     Cervical back: Normal range of motion and neck supple.  Skin:    General: Skin is warm and dry.  Neurological:     Mental Status: He is oriented to person, place, and time.  Psychiatric:        Behavior: Behavior normal.        Thought Content: Thought content normal.        Judgment: Judgment normal.    BP 122/78 (BP Location: Left Arm, Patient Position: Sitting, Cuff Size: Normal)   Pulse 67   Temp (!) 96.3 F  (35.7 C) (Temporal)   Ht 5' 10.5" (1.791 m)   Wt 219 lb 2 oz (99.4 kg)   SpO2 94%   BMI 31.00 kg/m  Wt Readings from Last 3 Encounters:  07/30/19 219 lb 2 oz (99.4 kg)  05/29/19 220 lb (99.8 kg)  02/13/19 220 lb (99.8 kg)     Lab Results  Component Value Date   WBC 7.3 04/25/2019   HGB 12.5 (L) 04/25/2019   HCT 38.5 04/25/2019   PLT 252 04/25/2019   GLUCOSE 97 07/30/2019   CHOL 166 07/30/2019   TRIG 83.0 07/30/2019   HDL 56.00  07/30/2019   LDLCALC 94 07/30/2019   ALT 19 07/30/2019   AST 20 07/30/2019   NA 140 07/30/2019   K 4.1 07/30/2019   CL 105 07/30/2019   CREATININE 1.77 (H) 07/30/2019   BUN 28 (H) 07/30/2019   CO2 26 07/30/2019   TSH 1.220 04/25/2019   PSA 2.10 12/24/2016   INR 1.0 11/08/2008   HGBA1C 5.8 03/27/2018    MR CERVICAL SPINE WO CONTRAST  Result Date: 03/11/2019 CLINICAL DATA:  Bilateral leg weakness.  Hyper reflexia. EXAM: MRI CERVICAL SPINE WITHOUT CONTRAST TECHNIQUE: Multiplanar, multisequence MR imaging of the cervical spine was performed. No intravenous contrast was administered. COMPARISON:  None. FINDINGS: Alignment: Mild retrolisthesis C3-4 and C4-5 and C6-7. Vertebrae: Normal bone marrow.  Negative for fracture or mass Cord: Normal signal and morphology Posterior Fossa, vertebral arteries, paraspinal tissues: Negative Disc levels: C2-3: Negative C3-4: Disc degeneration and spondylosis. Mild foraminal narrowing bilaterally due to spurring. No cord deformity C4-5 mild retrolisthesis. Disc degeneration and diffuse uncinate spurring causing mild foraminal narrowing bilaterally. Mild spinal stenosis. C5-6: Disc degeneration and mild uncinate spurring. Mild foraminal narrowing bilaterally due to spurring C6-7: Moderate right foraminal encroachment due to spurring. Mild left foraminal narrowing. C7-T1: Negative IMPRESSION: Disc degeneration and spondylosis. Mild foraminal narrowing bilaterally as described above. No focal disc protrusion. Electronically  Signed   By: Franchot Gallo M.D.   On: 03/11/2019 11:01     Assessment & Plan:  Plan  I am having William [name] maintain his (Flaxseed, Linseed, (FLAX SEED OIL PO)), aspirin, multivitamin, Vitamin D, fluticasone, NONFORMULARY OR COMPOUNDED ITEM, traMADol, NONFORMULARY OR COMPOUNDED ITEM, famotidine, lisinopril-hydrochlorothiazide, NONFORMULARY OR COMPOUNDED ITEM, Shingrix, acetaminophen, amLODipine, tamsulosin, carbamazepine, LORazepam, Eszopiclone, ezetimibe, and fenofibrate.  No orders of the defined types were placed in this encounter.   Problem List Items Addressed This Visit      Unprioritized   HTN (hypertension) - Primary    Well controlled, no changes to meds. Encouraged heart healthy diet such as the DASH diet and exercise as tolerated.       Relevant Orders   Lipid panel (Completed)   Comprehensive metabolic panel (Completed)   Hyperlipidemia   Relevant Orders   Lipid panel (Completed)   Comprehensive metabolic panel (Completed)   Hyperlipidemia LDL goal <100    Tolerating statin, encouraged heart healthy diet, avoid trans fats, minimize simple carbs and saturated fats. Increase exercise as tolerated         Follow-up: Return in about 6 months (around 01/27/2020), or if symptoms worsen or fail to improve, for annual exam, fasting.  Ann Held, DO

## 2019-07-30 NOTE — Patient Instructions (Addendum)
Visit Information  Goals Addressed            This Visit's Progress   . BP Goal <140/90      . Check blood pressure 3-4 times per week      . Get Vitamin D drawn at next office visit      . Pharmacy Care Plan       Current Barriers:  . Chronic Disease Management support, education, and care coordination needs related to HTN, HLD, Hx of Stroke, Anxiety, Osteoarthritis, Insomnia, BPH, GERD  Pharmacist Clinical Goal(s):  . BP Goal <140/90  Interventions: . Comprehensive medication review performed. . Check Blood Pressure 3-4 times per week . Get Vitamin D drawn at next office visit  Patient Self Care Activities:  . Patient's wife William [name]) manages most things for patient  Initial goal documentation        William [name] was given information about Chronic Care Management services today including:  1. CCM service includes personalized support from designated clinical staff supervised by his physician, including individualized plan of care and coordination with other care providers 2. 24/7 contact phone numbers for assistance for urgent and routine care needs. 3. Service will only be billed when office clinical staff spend 20 minutes or more in a month to coordinate care. 4. Only one practitioner may furnish and bill the service in a calendar month. 5. The patient may stop CCM services at any time (effective at the end of the month) by phone call to the office staff. 6. The patient will be responsible for cost sharing (co-pay) of up to 20% of the service fee (after annual deductible is met).  Patient agreed to services and verbal consent obtained.   Print copy of patient instructions provided.  The pharmacy team will reach out to the patient again over the next 30 days.   De Blanch, PharmD Clinical Pharmacist [facility] [phone]   Blood Pressure Record Sheet To take your blood pressure, you will need a blood pressure machine. You can buy  a blood pressure machine (blood pressure monitor) at your clinic, drug store, or online. When choosing one, consider:  An automatic monitor that has an arm cuff.  A cuff that wraps snugly around your upper arm. You should be able to fit only one finger between your arm and the cuff.  A device that stores blood pressure reading results.  Do not choose a monitor that measures your blood pressure from your wrist or finger. Follow your health care provider's instructions for how to take your blood pressure. To use this form:  Get one reading in the morning (a.m.) before you take any medicines.  Get one reading in the evening (p.m.) before supper.  Take at least 2 readings with each blood pressure check. This makes sure the results are correct. Wait 1-2 minutes between measurements.  Write down the results in the spaces on this form.  Repeat this once a week, or as told by your health care provider.  Make a follow-up appointment with your health care provider to discuss the results. Blood pressure log Date: _______________________  a.m. _____________________(1st reading) _____________________(2nd reading)  p.m. _____________________(1st reading) _____________________(2nd reading) Date: _______________________  a.m. _____________________(1st reading) _____________________(2nd reading)  p.m. _____________________(1st reading) _____________________(2nd reading) Date: _______________________  a.m. _____________________(1st reading) _____________________(2nd reading)  p.m. _____________________(1st reading) _____________________(2nd reading) Date: _______________________  a.m. _____________________(1st reading) _____________________(2nd reading)  p.m. _____________________(1st reading) _____________________(2nd reading) Date: _______________________  a.m. _____________________(1st reading) _____________________(2nd reading)  p.m. _____________________(1st reading)  _____________________(2nd reading) This information is not intended to replace advice given to you by your health care provider. Make sure you discuss any questions you have with your health care provider. Document Revised: 07/29/2017 Document Reviewed: 05/31/2017 Elsevier Patient Education  [place].    How to Take Your Blood Pressure You can take your blood pressure at home with a machine. You may need to check your blood pressure at home:  To check if you have high blood pressure (hypertension).  To check your blood pressure over time.  To make sure your blood pressure medicine is working. Supplies needed: You will need a blood pressure machine, or monitor. You can buy one at a drugstore or online. When choosing one:  Choose one with an arm cuff.  Choose one that wraps around your upper arm. Only one finger should fit between your arm and the cuff.  Do not choose one that measures your blood pressure from your wrist or finger. Your doctor can suggest a monitor. How to prepare Avoid these things for 30 minutes before checking your blood pressure:  Drinking caffeine.  Drinking alcohol.  Eating.  Smoking.  Exercising. Five minutes before checking your blood pressure:  Pee.  Sit in a dining chair. Avoid sitting in a soft couch or armchair.  Be quiet. Do not talk. How to take your blood pressure Follow the instructions that came with your machine. If you have a digital blood pressure monitor, these may be the instructions: 1. Sit up straight. 2. Place your feet on the floor. Do not cross your ankles or legs. 3. Rest your left arm at the level of your heart. You may rest it on a table, desk, or chair. 4. Pull up your shirt sleeve. 5. Wrap the blood pressure cuff around the upper part of your left arm. The cuff should be 1 inch (2.5 cm) above your elbow. It is best to wrap the cuff around bare skin. 6. Fit the cuff snugly around your arm. You should be able to  place only one finger between the cuff and your arm. 7. Put the cord inside the groove of your elbow. 8. Press the power button. 9. Sit quietly while the cuff fills with air and loses air. 10. Write down the numbers on the screen. 11. Wait 2-3 minutes and then repeat steps 1-10. What do the numbers mean? Two numbers make up your blood pressure. The first number is called systolic pressure. The second is called diastolic pressure. An example of a blood pressure reading is "120 over 80" (or 120/80). If you are an adult and do not have a medical condition, use this guide to find out if your blood pressure is normal: Normal  First number: below 120.  Second number: below 80. Elevated  First number: 120-129.  Second number: below 80. Hypertension stage 1  First number: 130-139.  Second number: 80-89. Hypertension stage 2  First number: 140 or above.  Second number: 46 or above. Your blood pressure is above normal even if only the top or bottom number is above normal. Follow these instructions at home:  Check your blood pressure as often as your doctor tells you to.  Take your monitor to your next doctor's appointment. Your doctor will: ? Make sure you are using it correctly. ? Make sure it is working right.  Make sure you understand what your blood pressure numbers should be.  Tell your doctor if your medicines are causing side  effects. Contact a doctor if:  Your blood pressure keeps being high. Get help right away if:  Your first blood pressure number is higher than 180.  Your second blood pressure number is higher than 120. This information is not intended to replace advice given to you by your health care provider. Make sure you discuss any questions you have with your health care provider. Document Revised: 05/13/2017 Document Reviewed: 11/07/2015 Elsevier Patient Education  2020 [employer].

## 2019-08-02 ENCOUNTER — Ambulatory Visit: Payer: PPO | Admitting: Podiatry

## 2019-08-03 ENCOUNTER — Encounter: Payer: Self-pay | Admitting: Physical Therapy

## 2019-08-03 ENCOUNTER — Ambulatory Visit: Payer: PPO | Admitting: Physical Therapy

## 2019-08-03 ENCOUNTER — Other Ambulatory Visit: Payer: Self-pay

## 2019-08-03 DIAGNOSIS — M6281 Muscle weakness (generalized): Secondary | ICD-10-CM

## 2019-08-03 DIAGNOSIS — R262 Difficulty in walking, not elsewhere classified: Secondary | ICD-10-CM | POA: Diagnosis not present

## 2019-08-03 DIAGNOSIS — M79605 Pain in left leg: Secondary | ICD-10-CM

## 2019-08-03 NOTE — Therapy (Signed)
[place], [name], [address] Phone: [phone]   Fax:  [phone]  Physical Therapy Treatment  Patient Details  Name: William [name] MRN: [id number] Date of Birth: 02/01/1941 Referring Provider (PT): Jaffey   Encounter Date: 08/03/2019  PT End of Session - 08/03/19 1016    Visit Number  35    Date for PT Re-Evaluation  08/03/19    PT Start Time  1013    PT Stop Time  1054    PT Time Calculation (min)  41 min    Activity Tolerance  Patient tolerated treatment well    Behavior During Therapy  [facility] for tasks assessed/performed       Past Medical History:  Diagnosis Date  . ANXIETY   . Arthritis    low back  . Bladder stone   . BPH (benign prostatic hypertrophy)   . Carotid artery occlusion   . 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 CVD (cerebrovascular disease)   . Eczema   . GERD (gastroesophageal reflux disease)   . History of carotid artery stenosis    S/P BILATERAL CEA  . 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 Hyperlipidemia   . Hypertension   . Nocturia   . S/P carotid endarterectomy    BILATERAL ICA--  PATENT PER DUPLEX  05-19-2012  . Squamous cell carcinoma    right calf  . Stroke [facility]) 1996   pt states no weaknes but has severe anxiety  . Urinary frequency   . Vitamin D deficiency     Past Surgical History:  Procedure Laterality Date  . APPENDECTOMY  AS CHILD  . CARDIOVASCULAR STRESS TEST  03-27-2012  DR CRENSHAW   LOW RISK LEXISCAN STUDY-- PROBABLE NORMAL PERFUSION AND SOFT TISSUE ATTENUATION/  NO ISCHEMIA/ EF 51%  . CAROTID ENDARTERECTOMY Bilateral LEFT  11-12-2008  DR GREG HAYES   RIGHT ICA  2006  ([facility])  . CYSTOSCOPY WITH LITHOLAPAXY N/A 02/26/2013   Procedure: CYSTOSCOPY WITH LITHOLAPAXY;  Surgeon: Franchot Gallo, MD;  Location: [facility];  Service: Urology;  Laterality: N/A;  . EYE SURGERY   Jan. 2016   cataract surgery both eyes  . INGUINAL HERNIA REPAIR Right 11-08-2006  . MASS EXCISION N/A 03/03/2016   Procedure: EXCISION OF BACK  MASS;  Surgeon: Stark Klein, MD;  Location: [place];  Service: General;  Laterality: N/A;  . 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 PROSTATE SURGERY    . TRANSURETHRAL RESECTION OF PROSTATE N/A 02/26/2013   Procedure: TRANSURETHRAL RESECTION OF THE PROSTATE WITH GYRUS INSTRUMENTS;  Surgeon: Franchot Gallo, MD;  Location: [facility];  Service: Urology;  Laterality: N/A;    There were no vitals filed for this visit.  Subjective Assessment - 08/03/19 1016    Subjective  Pt reports he is feeling good today, no pain    Patient Stated Goals  be stronger, walk around the block with his wife                       [facility] Adult PT Treatment/Exercise - 08/03/19 0001      Knee/Hip Exercises: Aerobic   Other Aerobic  UBE level 5 x 6 minutes      Knee/Hip Exercises: Standing   Hip Flexion  Both;2 sets;10 reps    Hip Flexion Limitations  5    Hip Abduction  Both;2 sets;10 reps    Abduction Limitations  5    Hip Extension  Both;2 sets;10 reps    Extension Limitations  5    Forward Step Up  Both;10 reps;Step Height: 6"   with opposite LE kicks   Walking with Sports Cord  10 reps each FWD/BWD, side to side with 20#    Other Standing Knee Exercises  blue band, rows and trunk rotations, 15 reps overhead press 6#    Other Standing Knee Exercises  standing on BOSU ball single arm reach, mini squats, heel raises      Knee/Hip Exercises: Seated   Long Arc Quad  Both    Long Arc Quad Weight  5 lbs.    Other Seated Knee/Hip Exercises  15 reps each, bicep curls 6#, FWD reach 6#, overhead press 6#    Sit to Sand  15 reps;without UE support   holdin g6#              PT Short Term Goals - 04/20/19 0920      PT SHORT TERM GOAL #1   Title  independent with initial HEP    Status   Partially Met        PT Long Term Goals - 07/26/19 1449      PT LONG TERM GOAL #3   Title  walk around our building without rest     Status  Partially Met      PT LONG TERM GOAL #4   Title  decrease pain with walking by 50%    Status  Partially Met      PT LONG TERM GOAL #5   Title  increase lumbar ROM 25%    Status  Achieved            Plan - 08/03/19 1054    Clinical Impression Statement  Pt was challenged with standing exercise, he did not like standing on BOSU ball.  Did use UE assist for this.  Strength is improving tolerated increased wt well.    Rehab Potential  Good    PT Frequency  2x / week    PT Treatment/Interventions  ADLs/Self Care Home Management;Gait training;Stair training;Functional mobility training;Neuromuscular re-education;Balance training;Therapeutic exercise;Therapeutic activities;Patient/family education;Manual techniques    PT Next Visit Plan  start to educate wife and patient on the HEP    Consulted and Agree with Plan of Care  Patient       Patient will benefit from skilled therapeutic intervention in order to improve the following deficits and impairments:  Abnormal gait, Decreased cognition, Cardiopulmonary status limiting activity, Decreased activity tolerance, Decreased endurance, Decreased range of motion, Decreased strength, Decreased balance, Difficulty walking, Decreased safety awareness, Improper body mechanics, Decreased mobility  Visit Diagnosis: Difficulty in walking, not elsewhere classified  Muscle weakness (generalized)  Pain in left leg     Problem List Patient Active Problem List   Diagnosis Date Noted  . Ingrown toenail 07/13/2019  . Lumbar spondylosis 05/02/2018  . Black head 04/28/2018  . Pain in left knee 03/09/2018  . 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 Trochanteric bursitis of left hip 01/16/2018  . 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 Preventative health care  09/26/2017  . HTN (hypertension) 07/19/2015  . Hyperlipidemia 07/19/2015  . Closed fracture of fifth metacarpal bone 05/15/2015  . 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  . Great toe pain 02/11/2014  . Dementia  with behavioral disturbance ([place]) 01/09/2014  . History of right MCA stroke 11/09/2013  . Obesity (BMI 30-39.9) 06/25/2013  . Renal insufficiency 06/25/2013  . Weakness of left arm 06/25/2013  . Aftercare following surgery of the circulatory system, [place] 05/17/2013  . Confusion 04/17/2012  . Sebaceous cyst 03/03/2011  . LUMBAR SPRAIN AND STRAIN 07/31/2010  . URINARY FREQUENCY 09/25/2009  . RIB PAIN, LEFT SIDED 08/29/2009  . DEPRESSION/ANXIETY 08/01/2009  . CAROTID ARTERY STENOSIS, BILATERAL 05/02/2009  . DIZZINESS 05/02/2009  . HEADACHE 05/02/2009  . ECZEMA, ATOPIC 05/31/2008  . Vitamin D deficiency 03/01/2008  . FATIGUE 01/26/2008  . BPH (benign prostatic hyperplasia) 08/06/2007  . ANXIETY 12/21/2006  . HOARSENESS 12/21/2006  . FASTING HYPERGLYCEMIA 12/21/2006  . Hyperlipidemia LDL goal <100 09/27/2006  . Unspecified cerebral artery occlusion with cerebral infarction 09/27/2006    Jeral Pinch PT  08/03/2019, 10:56 AM  [place], [name], [address] Phone: [phone]   Fax:  [phone]  Name: ZIA KANNER MRN: [id number] Date of Birth: 10/01/1940

## 2019-08-06 DIAGNOSIS — N183 Chronic kidney disease, stage 3 unspecified: Secondary | ICD-10-CM | POA: Diagnosis not present

## 2019-08-06 DIAGNOSIS — N400 Enlarged prostate without lower urinary tract symptoms: Secondary | ICD-10-CM | POA: Diagnosis not present

## 2019-08-06 DIAGNOSIS — I129 Hypertensive chronic kidney disease with stage 1 through stage 4 chronic kidney disease, or unspecified chronic kidney disease: Secondary | ICD-10-CM | POA: Diagnosis not present

## 2019-08-06 DIAGNOSIS — K219 Gastro-esophageal reflux disease without esophagitis: Secondary | ICD-10-CM | POA: Diagnosis not present

## 2019-08-06 MED FILL — LORazepam 0.5 MG TABS: 0.5 | 30 days supply | Qty: 150 | Fill #0

## 2019-08-07 ENCOUNTER — Ambulatory Visit: Payer: PPO | Admitting: Physical Therapy

## 2019-08-07 ENCOUNTER — Other Ambulatory Visit: Payer: Self-pay

## 2019-08-07 ENCOUNTER — Encounter: Payer: Self-pay | Admitting: Physical Therapy

## 2019-08-07 DIAGNOSIS — R262 Difficulty in walking, not elsewhere classified: Secondary | ICD-10-CM | POA: Diagnosis not present

## 2019-08-07 DIAGNOSIS — M79605 Pain in left leg: Secondary | ICD-10-CM

## 2019-08-07 DIAGNOSIS — M6281 Muscle weakness (generalized): Secondary | ICD-10-CM

## 2019-08-07 NOTE — Therapy (Signed)
[name], [name], [address] Phone: [phone]   Fax:  [phone]  Physical Therapy Treatment  Patient Details  Name: William [name] MRN: [id number] Date of Birth: 1940-07-14 Referring Provider (PT): Jaffey   Encounter Date: 08/07/2019  PT End of Session - 08/07/19 1438    Visit Number  36    Date for PT Re-Evaluation  09/04/19    PT Start Time  1355    PT Stop Time  1445    PT Time Calculation (min)  50 min    Activity Tolerance  Patient tolerated treatment well    Behavior During Therapy  [facility] for tasks assessed/performed       Past Medical History:  Diagnosis Date  . ANXIETY   . Arthritis    low back  . Bladder stone   . BPH (benign prostatic hypertrophy)   . Carotid artery occlusion   . 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 CVD (cerebrovascular disease)   . Eczema   . GERD (gastroesophageal reflux disease)   . History of carotid artery stenosis    S/P BILATERAL CEA  . 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 Hyperlipidemia   . Hypertension   . Nocturia   . S/P carotid endarterectomy    BILATERAL ICA--  PATENT PER DUPLEX  05-19-2012  . Squamous cell carcinoma    right calf  . Stroke [facility]) 1996   pt states no weaknes but has severe anxiety  . Urinary frequency   . Vitamin D deficiency     Past Surgical History:  Procedure Laterality Date  . APPENDECTOMY  AS CHILD  . CARDIOVASCULAR STRESS TEST  03-27-2012  DR CRENSHAW   LOW RISK LEXISCAN STUDY-- PROBABLE NORMAL PERFUSION AND SOFT TISSUE ATTENUATION/  NO ISCHEMIA/ EF 51%  . CAROTID ENDARTERECTOMY Bilateral LEFT  11-12-2008  DR GREG HAYES   RIGHT ICA  2006  ([facility])  . CYSTOSCOPY WITH LITHOLAPAXY N/A 02/26/2013   Procedure: CYSTOSCOPY WITH LITHOLAPAXY;  Surgeon: Franchot Gallo, MD;  [facility];  Service: Urology;  Laterality: N/A;  . EYE SURGERY   Jan. 2016   cataract surgery both eyes  . INGUINAL HERNIA REPAIR Right 11-08-2006  . MASS EXCISION N/A 03/03/2016   Procedure: EXCISION OF BACK  MASS;  Surgeon: Stark Klein, MD;  Location: [place];  Service: General;  Laterality: N/A;  . 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 PROSTATE SURGERY    . TRANSURETHRAL RESECTION OF PROSTATE N/A 02/26/2013   Procedure: TRANSURETHRAL RESECTION OF THE PROSTATE WITH GYRUS INSTRUMENTS;  Surgeon: Franchot Gallo, MD;  Location: [facility];  Service: Urology;  Laterality: N/A;    There were no vitals filed for this visit.  Subjective Assessment - 08/07/19 1401    Subjective  Reports that he has been able to walk the dog a few times without much difficulty    Currently in Pain?  Yes    Pain Score  2     Pain Location  Back    Pain Orientation  Lower         [facility] PT Assessment - 08/07/19 0001      Assessment   Medical Diagnosis  Leg weakness, leg pain , difficulty walking    Referring Provider (PT)  Tana Felts  [place] Adult PT Treatment/Exercise - 08/07/19 0001      Ambulation/Gait   Gait Comments  around the buiilding some slight out of control going down the slope, he was able to slow down and be in control with some cues, needed one standing rest      High Level Balance   High Level Balance Activities  Side stepping;Backward walking      Knee/Hip Exercises: Aerobic   Other Aerobic  UBE level 5 x 6 minutes      Knee/Hip Exercises: Standing   Hip Flexion  Both;2 sets;10 reps    Hip Flexion Limitations  5    Hip Abduction  Both;2 sets;10 reps    Abduction Limitations  5    Hip Extension  Both;2 sets;10 reps    Extension Limitations  5    Other Standing Knee Exercises  blue band, rows and trunk rotations, 15 reps overhead press 6#, 6" toe touches, does not like raising the left kleg up, even though the right leg is his better leg    Other Standing Knee Exercises   standing on BOSU ball single arm reach, mini squats, heel raises      Knee/Hip Exercises: Seated   Long Arc Quad  Both    Long Arc Quad Weight  5 lbs.               PT Short Term Goals - 04/20/19 0920      PT SHORT TERM GOAL #1   Title  independent with initial HEP    Status  Partially Met        PT Long Term Goals - 08/07/19 1441      PT LONG TERM GOAL #1   Title  Understand fall risks, the importance of exercise and flexibility to health and function    Status  Achieved      PT LONG TERM GOAL #2   Title  decrease TUG time to 16 seconds    Status  Achieved      PT LONG TERM GOAL #3   Title  walk around our building without rest     Status  Partially Met      PT LONG TERM GOAL #4   Title  decrease pain with walking by 50%    Status  Partially Met      PT LONG TERM GOAL #5   Title  increase lumbar ROM 25%    Status  Achieved            Plan - 08/07/19 1439    Clinical Impression Statement  Patient had difficulty raising the left leg up to the step, he seemed to lose balance even though the right leg is his better leg, he dies struggle with the standing activities, he did well walking around the building today, better control, needed to have a standing rest break    PT Next Visit Plan  start to educate wife and patient on the HEP    Consulted and Agree with Plan of Care  Patient       Patient will benefit from skilled therapeutic intervention in order to improve the following deficits and impairments:  Abnormal gait, Decreased cognition, Cardiopulmonary status limiting activity, Decreased activity tolerance, Decreased endurance, Decreased range of motion, Decreased strength, Decreased balance, Difficulty walking, Decreased safety awareness, Improper body mechanics, Decreased mobility  Visit Diagnosis: Difficulty in walking, not elsewhere classified - Plan: PT plan of care cert/re-cert  Muscle weakness (generalized) - Plan: PT plan  of care  cert/re-cert  Pain in left leg - Plan: PT plan of care cert/re-cert     Problem List Patient Active Problem List   Diagnosis Date Noted  . Ingrown toenail 07/13/2019  . Lumbar spondylosis 05/02/2018  . Black head 04/28/2018  . Pain in left knee 03/09/2018  . 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 Trochanteric bursitis of left hip 01/16/2018  . 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 Preventative health care 09/26/2017  . HTN (hypertension) 07/19/2015  . Hyperlipidemia 07/19/2015  . Closed fracture of fifth metacarpal bone 05/15/2015  . 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  . Great toe pain 02/11/2014  . Dementia with behavioral disturbance ([place]) 01/09/2014  . History of right MCA stroke 11/09/2013  . Obesity (BMI 30-39.9) 06/25/2013  . Renal insufficiency 06/25/2013  . Weakness of left arm 06/25/2013  . Aftercare following surgery of the circulatory system, [place] 05/17/2013  . Confusion 04/17/2012  . Sebaceous cyst 03/03/2011  . LUMBAR SPRAIN AND STRAIN 07/31/2010  . URINARY FREQUENCY 09/25/2009  . RIB PAIN, LEFT SIDED 08/29/2009  . DEPRESSION/ANXIETY 08/01/2009  . CAROTID ARTERY STENOSIS, BILATERAL 05/02/2009  . DIZZINESS 05/02/2009  . HEADACHE 05/02/2009  . ECZEMA, ATOPIC 05/31/2008  . Vitamin D deficiency 03/01/2008  . FATIGUE 01/26/2008  . BPH (benign prostatic hyperplasia) 08/06/2007  . ANXIETY 12/21/2006  . HOARSENESS 12/21/2006  . FASTING HYPERGLYCEMIA 12/21/2006  . Hyperlipidemia LDL goal <100 09/27/2006  . Unspecified cerebral artery occlusion with cerebral infarction 09/27/2006    Sumner Boast., PT 08/07/2019, 2:43 PM  [place], [name], [address] Phone: [phone]   Fax:  [phone]  Name: William [name] MRN: [id number] Date of Birth: April 11, 1941

## 2019-08-09 ENCOUNTER — Ambulatory Visit: Payer: PPO | Admitting: Physical Therapy

## 2019-08-09 ENCOUNTER — Other Ambulatory Visit: Payer: Self-pay

## 2019-08-09 ENCOUNTER — Encounter: Payer: Self-pay | Admitting: Physical Therapy

## 2019-08-09 VITALS — BP 140/78

## 2019-08-09 DIAGNOSIS — M6281 Muscle weakness (generalized): Secondary | ICD-10-CM

## 2019-08-09 DIAGNOSIS — M79605 Pain in left leg: Secondary | ICD-10-CM

## 2019-08-09 DIAGNOSIS — R262 Difficulty in walking, not elsewhere classified: Secondary | ICD-10-CM | POA: Diagnosis not present

## 2019-08-09 NOTE — Therapy (Signed)
[place], [name], [address] Phone: [phone]   Fax:  [phone]  Physical Therapy Treatment  Patient Details  Name: William [name] MRN: [id number] Date of Birth: [birth year] Referring Provider (PT): Jaffey   Encounter Date: 08/09/2019  PT End of Session - 08/09/19 1617    Visit Number  37    Date for PT Re-Evaluation  09/04/19    PT Start Time  1526    PT Stop Time  1610    PT Time Calculation (min)  44 min    Activity Tolerance  Patient tolerated treatment well    Behavior During Therapy  [facility] for tasks assessed/performed       Past Medical History:  Diagnosis Date  . ANXIETY   . Arthritis    low back  . Bladder stone   . BPH (benign prostatic hypertrophy)   . Carotid artery occlusion   . 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 CVD (cerebrovascular disease)   . Eczema   . GERD (gastroesophageal reflux disease)   . History of carotid artery stenosis    S/P BILATERAL CEA  . 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 Hyperlipidemia   . Hypertension   . Nocturia   . S/P carotid endarterectomy    BILATERAL ICA--  PATENT PER DUPLEX  05-19-2012  . Squamous cell carcinoma    right calf  . Stroke [facility]) 1996   pt states no weaknes but has severe anxiety  . Urinary frequency   . Vitamin D deficiency     Past Surgical History:  Procedure Laterality Date  . APPENDECTOMY  AS CHILD  . CARDIOVASCULAR STRESS TEST  03-27-2012  DR CRENSHAW   LOW RISK LEXISCAN STUDY-- PROBABLE NORMAL PERFUSION AND SOFT TISSUE ATTENUATION/  NO ISCHEMIA/ EF 51%  . CAROTID ENDARTERECTOMY Bilateral LEFT  11-12-2008  DR GREG HAYES   RIGHT ICA  2006  ([facility])  . CYSTOSCOPY WITH LITHOLAPAXY N/A 02/26/2013   Procedure: CYSTOSCOPY WITH LITHOLAPAXY;  Surgeon: Franchot Gallo, MD;  Location: [facility];  Service: Urology;  Laterality: N/A;  . EYE SURGERY   Jan. 2016   cataract surgery both eyes  . INGUINAL HERNIA REPAIR Right 11-08-2006  . MASS EXCISION N/A 03/03/2016   Procedure: EXCISION OF BACK  MASS;  Surgeon: Stark Klein, MD;  Location: [facility];  Service: General;  Laterality: N/A;  . 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 PROSTATE SURGERY    . TRANSURETHRAL RESECTION OF PROSTATE N/A 02/26/2013   Procedure: TRANSURETHRAL RESECTION OF THE PROSTATE WITH GYRUS INSTRUMENTS;  Surgeon: Franchot Gallo, MD;  Location: [facility];  Service: Urology;  Laterality: N/A;    Vitals:   08/09/19 1613  BP: 140/78    Subjective Assessment - 08/09/19 1535    Subjective  Patient reports that he is tired today, as we were doing exercises I noticed that his left arm was not working as well as normal, he did not have slurred speech, denied numbness, tingling, no facial droop, no arm pain, I checked his BP before we started and it was 140/78, he reports that is normal for him                       [facility] Adult PT Treatment/Exercise - 08/09/19 0001      High  Level Balance   High Level Balance Comments  seated ball batting back and forth and performed in standing as well, then ball throwing with the left arm seated and in standing, used various size balls      Knee/Hip Exercises: Aerobic   Other Aerobic  UBE level 5 x 6 minutes, during this his left arm kept sliding off the bike and he had difficulty putting it back on the handle      Knee/Hip Exercises: Machines for Strengthening   Other Machine  red tband rows and extension with the left arm only      Knee/Hip Exercises: Seated   Long Arc Quad  Both    Long Arc Quad Weight  5 lbs.    Knee/Hip Flexion  overhead press 5#, then 5# hip to hip for core, 6# biceps, 3# alternating punches, bosu behind partial sit ups    Marching  Both;2 sets;10 reps    Marching Weights  5 lbs.               PT Short Term Goals - 04/20/19 0920       PT SHORT TERM GOAL #1   Title  independent with initial HEP    Status  Partially Met        PT Long Term Goals - 08/09/19 1619      PT LONG TERM GOAL #1   Title  Understand fall risks, the importance of exercise and flexibility to health and function    Status  Achieved      PT LONG TERM GOAL #2   Title  decrease TUG time to 16 seconds    Status  Achieved      PT LONG TERM GOAL #3   Title  walk around our building without rest     Status  Partially Met            Plan - 08/09/19 1617    Clinical Impression Statement  Patient with some difficulty with the left hand and arm today, seemed to not have as good of control, this is the stroke side, BP was WNL's.  I spoke to his wife about this.  He otherwise did very well just reported fatigue and tired legs, he did say he did not sleep well last night    PT Next Visit Plan  start to educate wife and patient on the HEP    Consulted and Agree with Plan of Care  Patient       Patient will benefit from skilled therapeutic intervention in order to improve the following deficits and impairments:  Abnormal gait, Decreased cognition, Cardiopulmonary status limiting activity, Decreased activity tolerance, Decreased endurance, Decreased range of motion, Decreased strength, Decreased balance, Difficulty walking, Decreased safety awareness, Improper body mechanics, Decreased mobility  Visit Diagnosis: Difficulty in walking, not elsewhere classified  Muscle weakness (generalized)  Pain in left leg     Problem List Patient Active Problem List   Diagnosis Date Noted  . Ingrown toenail 07/13/2019  . Lumbar spondylosis 05/02/2018  . Black head 04/28/2018  . Pain in left knee 03/09/2018  . 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 Trochanteric bursitis of left hip 01/16/2018  . 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 Preventative health care 09/26/2017  . HTN (hypertension) 07/19/2015  .  Hyperlipidemia 07/19/2015  . Closed fracture of fifth metacarpal bone 05/15/2015  . 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  . Great toe pain  02/11/2014  . Dementia with behavioral disturbance ([place]) 01/09/2014  . History of right MCA stroke 11/09/2013  . Obesity (BMI 30-39.9) 06/25/2013  . Renal insufficiency 06/25/2013  . Weakness of left arm 06/25/2013  . Aftercare following surgery of the circulatory system, [place] 05/17/2013  . Confusion 04/17/2012  . Sebaceous cyst 03/03/2011  . LUMBAR SPRAIN AND STRAIN 07/31/2010  . URINARY FREQUENCY 09/25/2009  . RIB PAIN, LEFT SIDED 08/29/2009  . DEPRESSION/ANXIETY 08/01/2009  . CAROTID ARTERY STENOSIS, BILATERAL 05/02/2009  . DIZZINESS 05/02/2009  . HEADACHE 05/02/2009  . ECZEMA, ATOPIC 05/31/2008  . Vitamin D deficiency 03/01/2008  . FATIGUE 01/26/2008  . BPH (benign prostatic hyperplasia) 08/06/2007  . ANXIETY 12/21/2006  . HOARSENESS 12/21/2006  . FASTING HYPERGLYCEMIA 12/21/2006  . Hyperlipidemia LDL goal <100 09/27/2006  . Unspecified cerebral artery occlusion with cerebral infarction 09/27/2006    Sumner Boast., PT 08/09/2019, 4:20 PM  [place], [name], [address] Phone: [phone]   Fax:  [phone]  Name: William [name] MRN: [id number] Date of Birth: [birth year]

## 2019-08-10 ENCOUNTER — Telehealth: Payer: Self-pay | Admitting: Neurology

## 2019-08-10 NOTE — Telephone Encounter (Signed)
Patient's wife called in regarding William [name] and him maybe having a cognitive issue. She said he has had a stroke in the past. She said while in PT the therapist had him try to touch his thumb to his fingers and was having trouble with that.  She would like him to have a sooner appt than in April. Please Advise? Thank you

## 2019-08-10 NOTE — Telephone Encounter (Signed)
That's fine

## 2019-08-13 ENCOUNTER — Other Ambulatory Visit: Payer: Self-pay | Admitting: Family Medicine

## 2019-08-13 MED FILL — LISINOPRIL-HCTZ 10-12.5 MG: 10-12.5 | 30 days supply | Qty: 30 | Fill #4

## 2019-08-14 ENCOUNTER — Other Ambulatory Visit: Payer: Self-pay

## 2019-08-14 ENCOUNTER — Ambulatory Visit: Payer: PPO | Attending: Neurology | Admitting: Physical Therapy

## 2019-08-14 ENCOUNTER — Encounter: Payer: Self-pay | Admitting: Physical Therapy

## 2019-08-14 ENCOUNTER — Encounter: Payer: Self-pay | Admitting: Neurology

## 2019-08-14 DIAGNOSIS — R262 Difficulty in walking, not elsewhere classified: Secondary | ICD-10-CM

## 2019-08-14 DIAGNOSIS — M6281 Muscle weakness (generalized): Secondary | ICD-10-CM

## 2019-08-14 DIAGNOSIS — M79605 Pain in left leg: Secondary | ICD-10-CM | POA: Diagnosis not present

## 2019-08-14 MED FILL — AMLODIPINE BESYLATE 5 MG TA: 5 | 90 days supply | Qty: 90 | Fill #0

## 2019-08-14 NOTE — Therapy (Signed)
[place], [name], [address] Phone: [phone]   Fax:  [phone]  Physical Therapy Treatment  Patient Details  Name: William [name] MRN: [id number] Date of Birth: Nov 23, 1940 Referring Provider (PT): Jaffey   Encounter Date: 08/14/2019  PT End of Session - 08/14/19 1420    Visit Number  38    Date for PT Re-Evaluation  09/04/19    PT Start Time  1355    PT Stop Time  1435    PT Time Calculation (min)  40 min    Activity Tolerance  Patient tolerated treatment well    Behavior During Therapy  [facility] for tasks assessed/performed       Past Medical History:  Diagnosis Date  . ANXIETY   . Arthritis    low back  . Bladder stone   . BPH (benign prostatic hypertrophy)   . Carotid artery occlusion   . 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 CVD (cerebrovascular disease)   . Eczema   . GERD (gastroesophageal reflux disease)   . History of carotid artery stenosis    S/P BILATERAL CEA  . 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 Hyperlipidemia   . Hypertension   . Nocturia   . S/P carotid endarterectomy    BILATERAL ICA--  PATENT PER DUPLEX  05-19-2012  . Squamous cell carcinoma    right calf  . Stroke [facility]) 1996   pt states no weaknes but has severe anxiety  . Urinary frequency   . Vitamin D deficiency     Past Surgical History:  Procedure Laterality Date  . APPENDECTOMY  AS CHILD  . CARDIOVASCULAR STRESS TEST  03-27-2012  DR CRENSHAW   LOW RISK LEXISCAN STUDY-- PROBABLE NORMAL PERFUSION AND SOFT TISSUE ATTENUATION/  NO ISCHEMIA/ EF 51%  . CAROTID ENDARTERECTOMY Bilateral LEFT  11-12-2008  DR GREG HAYES   RIGHT ICA  2006  ([facility])  . CYSTOSCOPY WITH LITHOLAPAXY N/A 02/26/2013   Procedure: CYSTOSCOPY WITH LITHOLAPAXY;  Surgeon: Franchot Gallo, MD;  Location[facility]  Service: Urology;  Laterality: N/A;  . EYE SURGERY   Jan. 2016   cataract surgery both eyes  . INGUINAL HERNIA REPAIR Right 11-08-2006  . MASS EXCISION N/A 03/03/2016   Procedure: EXCISION OF BACK  MASS;  Surgeon: Stark Klein, MD;  Location: [facility];  Service: General;  Laterality: N/A;  . 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 PROSTATE SURGERY    . TRANSURETHRAL RESECTION OF PROSTATE N/A 02/26/2013   Procedure: TRANSURETHRAL RESECTION OF THE PROSTATE WITH GYRUS INSTRUMENTS;  Surgeon: Franchot Gallo, MD;  Location: [facility];  Service: Urology;  Laterality: N/A;    There were no vitals filed for this visit.  Subjective Assessment - 08/14/19 1406    Subjective  Patients wife reports that she has noticed the left hand not working as well, she does report that she has an MD appointment virtually yesterday    Currently in Pain?  Yes    Pain Score  1     Pain Location  Back         [facility] PT Assessment - 08/14/19 0001      Berg Balance Test   Sit to Stand  Able to stand without using hands and stabilize independently    Standing Unsupported  Able to stand safely  2 minutes    Sitting with Back Unsupported but Feet Supported on Floor or Stool  Able to sit safely and securely 2 minutes    Stand to Sit  Sits safely with minimal use of hands    Transfers  Able to transfer safely, minor use of hands    Standing Unsupported with Eyes Closed  Able to stand 10 seconds with supervision    Standing Unsupported with Feet Together  Able to place feet together independently and stand 1 minute safely    From Standing, Reach Forward with Outstretched Arm  Can reach forward >12 cm safely (5")    From Standing Position, Pick up Object from Floor  Able to pick up shoe, needs supervision    From Standing Position, Turn to Look Behind Over each Shoulder  Turn sideways only but maintains balance    Turn 360 Degrees  Able to turn 360 degrees safely one side only in 4 seconds or less    Standing Unsupported,  Alternately Place Feet on Step/Stool  Able to stand independently and complete 8 steps >20 seconds    Standing Unsupported, One Foot in Front  Able to take small step independently and hold 30 seconds    Standing on One Leg  Able to lift leg independently and hold equal to or more than 3 seconds    Total Score  45      Timed Up and Go Test   Normal TUG (seconds)  13                   [facility] Adult PT Treatment/Exercise - 08/14/19 0001      Ambulation/Gait   Gait Comments  gait to the front door and back, slight drag of the left leg with walking      Knee/Hip Exercises: Aerobic   Other Aerobic  UBE level 5 x 6 minutes, much better today with the hand on the handle      Knee/Hip Exercises: Seated   Long Arc Quad  Both    Long Arc Quad Weight  5 lbs.    Knee/Hip Flexion  overhead press 5#, then 5# hip to hip for core, 6# biceps, 3# alternating punches, bosu behind partial sit ups    Marching  Both;2 sets;10 reps    Marching Weights  5 lbs.               PT Short Term Goals - 04/20/19 0920      PT SHORT TERM GOAL #1   Title  independent with initial HEP    Status  Partially Met        PT Long Term Goals - 08/14/19 1431      PT LONG TERM GOAL #1   Title  Understand fall risks, the importance of exercise and flexibility to health and function    Status  Achieved      PT LONG TERM GOAL #2   Title  decrease TUG time to 16 seconds      PT LONG TERM GOAL #3   Title  walk around our building without rest     Status  Partially Met      PT LONG TERM GOAL #4   Title  decrease pain with walking by 50%    Status  Achieved            Plan - 08/14/19 1421    Clinical Impression Statement  PAtient overall has made good gains with less hip and back  pain, he decresed the TUG time from 21 seconds  down to 13 seconds and his Berg Balance test increased from 36/56 to 45/56 so he is at a lower risk of falls.  Patient  had an instance last week when his left hand  was not working very well, he has difficulty with opposition  He did better today but that was a concern    PT Next Visit Plan  work on D/C and what he can do for exercise at home    Consulted and Agree with Plan of Care  Patient       Patient will benefit from skilled therapeutic intervention in order to improve the following deficits and impairments:  Abnormal gait, Decreased cognition, Cardiopulmonary status limiting activity, Decreased activity tolerance, Decreased endurance, Decreased range of motion, Decreased strength, Decreased balance, Difficulty walking, Decreased safety awareness, Improper body mechanics, Decreased mobility  Visit Diagnosis: Difficulty in walking, not elsewhere classified  Muscle weakness (generalized)  Pain in left leg     Problem List Patient Active Problem List   Diagnosis Date Noted  . Ingrown toenail 07/13/2019  . Lumbar spondylosis 05/02/2018  . Black head 04/28/2018  . Pain in left knee 03/09/2018  . 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 Trochanteric bursitis of left hip 01/16/2018  . 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 Preventative health care 09/26/2017  . HTN (hypertension) 07/19/2015  . Hyperlipidemia 07/19/2015  . Closed fracture of fifth metacarpal bone 05/15/2015  . 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  . Great toe pain 02/11/2014  . Dementia with behavioral disturbance ([place]) 01/09/2014  . History of right MCA stroke 11/09/2013  . Obesity (BMI 30-39.9) 06/25/2013  . Renal insufficiency 06/25/2013  . Weakness of left arm 06/25/2013  . Aftercare following surgery of the circulatory system, [place] 05/17/2013  . Confusion 04/17/2012  . Sebaceous cyst 03/03/2011  . LUMBAR SPRAIN AND STRAIN 07/31/2010  . URINARY FREQUENCY 09/25/2009  . RIB PAIN, LEFT SIDED 08/29/2009  . DEPRESSION/ANXIETY 08/01/2009  . CAROTID ARTERY STENOSIS, BILATERAL 05/02/2009  . DIZZINESS 05/02/2009  . HEADACHE  05/02/2009  . ECZEMA, ATOPIC 05/31/2008  . Vitamin D deficiency 03/01/2008  . FATIGUE 01/26/2008  . BPH (benign prostatic hyperplasia) 08/06/2007  . ANXIETY 12/21/2006  . HOARSENESS 12/21/2006  . FASTING HYPERGLYCEMIA 12/21/2006  . Hyperlipidemia LDL goal <100 09/27/2006  . Unspecified cerebral artery occlusion with cerebral infarction 09/27/2006    Sumner Boast., PT 08/14/2019, 2:32 PM  [place], [name], [address] Phone: [phone]   Fax:  [phone]  Name: YARDLEY LEKAS MRN: [id number] Date of Birth: December 11, 1940

## 2019-08-14 NOTE — Progress Notes (Signed)
Due to the COVID-19 crisis, this virtual check-in visit was done via telephone from my office and it was initiated and consent given by this patient and or family.   Telephone (Audio) Visit The purpose of this telephone visit is to provide medical care while limiting exposure to the novel coronavirus.    Consent was obtained for telephone visit and initiated by pt/family:  Yes Answered questions that patient had about telehealth interaction:  Yes I discussed the limitations, risks, security and privacy concerns of performing an evaluation and management service by telephone. I also discussed with the patient that there may be a patient responsible charge related to this service. The patient expressed understanding and agreed to proceed.  Pt location: Home Physician Location: office Name of referring provider:  Ann Held, * I connected with .William [name] at patients initiation/request on 08/15/2019 at  8:50 AM EST by telephone and verified that I am speaking with the correct person using two identifiers.  Pt MRN:  [id number] Pt DOB:  01-Nov-1940   History of Present Illness:  William [name] is a 79 year old man with dementia, CKD, HTN, CAD, HLD and history of CVA who follows up for cognitive changes.  He is accompanied by his wife who supplements history.   UPDATE: We attempted to connect for a video visit but had difficulty, so visit changed to phone encounter.  Current medications:  Tegretol-XR 100mg  at bedtime; lorazepam PRN; tramadol 50mg  Q8h PRN  There has been an acute change over the past 2 weeks.  At physical therapy, he was noted to have increased difficulty using his left side (affected side from his stroke).  He had difficulty performing finger-thumb tapping and had trouble gripping the handle of the exercise machine with his left hand.  He also has had acute cognitive changes.  He started having trouble dialing his daughter's phone number which is new.      HISTORY: 1.  Bilateral Leg Pain & Weakness: Prior to COVID, he was going to the gym and seeing a personal trainer but it stopped once the pandemic hit.  Since the pandemic, he has been sedentary.  He walks the dog once or twice a week.  He started having bilateral leg pain later that year in 2020.  No associated back pain.  No shooting pain.  It is described as an aching in his calves.  No associated numbness.  He also reports weakness.  When he walks, he has trouble picking up his legs.  He seems to shuffle his feet.  When he gets up to stand, he feels like he can fall forward.  He had one fall while standing at the toilet and he bent forward to spit in toilet.  No change in bowel habit.  He was having urinary issues so he was taken off of Flomax.    Vascular US ABI from 12/08/18 showed abnormal toe-brachial index in both lower extremities but otherwise unremarkable.  MRI of cervical spine without contrast from 03/10/2019 showed some cervical degenerative disc disease and spondylosis with mild foraminal narrowing but no spinal stenosis or compressive myelopathy.  He had been started on new medications a few months prior.  He was switched from lisinopril to Norvasc and then lisinopril was added again with HCTZ in order to help with the fluid in the legs.  He does have history of low back pain and lumbar spondylosis.  He had an epidural injection in the lumbar region performed by Dr.  Ramos in 2020 for low back pain which was effective.    He previously had leg pain on a statin so he was switched to Zetia.  It helped for awhile.    2.  Dementia: He had a stroke in 2006, after experiencing left facial droop, left arm and leg weakness, as well slurred vision and vision problems.  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  He has some residual left sided weakness and facial weakness.  He underwent left carotid endarterectomy in 2010 for asymptomatic  stenosis.  Beginning around 2013, he and his wife have noted a gradual progression and cognitive deficits. Onset correlated during a period when he was experiencing dizziness and syncope secondary to orthostatic hypotension. At that time, he was dehydrated and not drinking much fluids. He was instructed to start increasing his fluid intake as well as his sodium intake. Now, he tries to drink 3 bottles of water per day. He particularly notes confusion regarding how to perform certain everyday tasks. For example, it takes him a long time to unload the dishwasher because he has trouble figuring out where to put the dishes. He use to enjoy cooking and preparing meals. Now he has difficulty cooking and can only see things up in the microwave. He also has been experiencing dressing apraxia. Sometimes he will put both his feet into one leg of his pants. Other times, he has difficulty buttoning his shirts. He denies any language dysfunction such as difficulty understanding other people or other people not understanding him. He has no trouble reading or writing. His short-term and long-term memory are pretty good. He denies any difficulty with face recognition. He denies hallucinations or delusions. He has not had any change in his personality or behavior. He still drives but very seldom and only locally. He has not had any problems with accidents or near accidents, but he says he drives slowly because he is very nervous and cautious. He does have history of anxiety and often shakes when he gets nervous. He denies any family history of dementia. However, there is a psychiatric history with his mother and sister.  He denies history of alcohol abuse or drug use.  He underwent neuropsychological testing on 10/25/13 with Dr. Macarthur Critchley at [facility] suggested mild dementia due to his stroke, but also underlying neurodegenerative process.  Memory and left hemispheric medial temporal functions were intact.   Findings showed deficits in visual processing and executive dysfunction.  Onset of visual-spatial deficits and apraxia may suggest occipital lobe involvement, such as due to an Alzheimer's variant called posterior cortical atrophy.  MRI from May 2015 revealed global atrophy but it does not seem more prominent in the occipital lobes.  Prior medication:  Aricept (diarrhea, vivid dreams); galantamine (nausea)  12/24/13 LABS:  LDL 49,  10/16/13 LABS:  B12 720, TSH 1.668 10/19/13 MRI BRAIN WO:  stable remote large right MCA territory infarct.  Chronic microvascular ischemic changes. 05/21/13 LABS:  Hgb A1c 6 05/17/13 Carotid doppler: bilateral 1-39% stenosis of the right proximal ICA and no stenosis of the left ICA. 03/11/12 MRI Brain wo contrast:  Remote large right MCA infarct. Dementia with behavioral symptoms.  Etiology unclear.  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 ANXIETY   . Arthritis    low back  . Bladder stone   .  BPH (benign prostatic hypertrophy)   . Carotid artery occlusion   . 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 CVD (cerebrovascular disease)   . Eczema   . GERD (gastroesophageal reflux disease)   . History of carotid artery stenosis    S/P BILATERAL CEA  . 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 Hyperlipidemia   . Hypertension   . Nocturia   . S/P carotid endarterectomy    BILATERAL ICA--  PATENT PER DUPLEX  05-19-2012  . Squamous cell carcinoma    right calf  . Stroke [facility]) 1996   pt states no weaknes but has severe anxiety  . Urinary frequency   . Vitamin D deficiency     Medications: Outpatient Encounter Medications as of 08/15/2019  Medication Sig Note  . acetaminophen (TYLENOL) 500 MG tablet Take 500 mg by mouth every 6 (six) hours as  needed (1 tab as needed).   [name] amLODipine (NORVASC) 5 MG tablet TAKE 1 TABLET (5 MG TOTAL) BY MOUTH DAILY.   [name] aspirin 325 MG EC tablet Take 325 mg by mouth daily.   . carbamazepine (TEGRETOL-XR) 100 MG 12 hr tablet 1 qam  1  qhs   . Cholecalciferol (VITAMIN D) 2000 UNITS CAPS Take by mouth.   . docusate sodium (COLACE) 100 MG capsule Take 100 mg by mouth daily.   . Eszopiclone 3 MG TABS TAKE 1 TABLET BY MOUTH AT BEDTIME AS NEEDED TAKE IMMEDIATELY BEFORE BEDTIME   . ezetimibe (ZETIA) 10 MG tablet TAKE 1 TABLET (10 MG TOTAL) BY MOUTH DAILY.   . famotidine (PEPCID) 20 MG tablet Take 20 mg by mouth 2 (two) times daily.   . fenofibrate 160 MG tablet TAKE 1 TABLET BY MOUTH DAILY. REPEAT LABS ARE DUE NOW   . Flaxseed, Linseed, (FLAX SEED OIL PO) Take by mouth daily.     . fluticasone (CUTIVATE) 0.05 % cream    . lisinopril-hydrochlorothiazide (ZESTORETIC) 10-12.5 MG tablet Take 1 tablet by mouth daily.   [name] LORazepam (ATIVAN) 0.5 MG tablet 2  Bid  1 as needed   . Multiple Vitamin (MULTIVITAMIN) tablet Take 1 tablet by mouth daily. 07/30/2019: Chewable gummie from [facility]  . NONFORMULARY OR COMPOUNDED [facility]:  Onychomycosis Nail lacquer - Fluconazole 2%, Terbinafine 1%, DMSO, apply to affected area daily. (Patient not taking: Reported on 07/30/2019)   . NONFORMULARY OR COMPOUNDED ITEM [facility]:  Antifungal topical - Terbinafine 3%, Fluconazole 2%, Tea Tree Oil 5%, Urea 10/%,, Ibuprofen 2% in DMSO Suspension #41ml. Apply to the affected nail(s) once (at bedtime) or twice daily.   . NONFORMULARY OR COMPOUNDED ITEM Compression socks  #1  Dx low ext edema and ache (Patient not taking: Reported on 07/30/2019) 07/30/2019: [name] reports they have the stockings, but have not needed them  . tamsulosin (FLOMAX) 0.4 MG CAPS capsule Take 1 capsule (0.4 mg total) by mouth daily.   . traMADol (ULTRAM) 50 MG tablet Take 1 tablet (50 mg total) by mouth every 8 (eight) hours as needed.   . Zoster  Vaccine Adjuvanted [facility]) injection 0.5 ml IM x1 repeat in 2 months    No facility-administered encounter medications on file as of 08/15/2019.    Allergies: Allergies  Allergen Reactions  . Other Anaphylaxis  . Strawberry Extract Hives  . Adhesive [Tape] Other (See Comments)    BLISTER  . Latex     Reaction to Plastic Tape  . Statins Other (See  Comments)    myalgias  . Strawberry (Diagnostic) Hives    Family History: Family History  Problem Relation Age of Onset  . Heart disease Mother        CHF  . Bipolar disorder Mother   . Heart disease Father        CHF    Social History: Social History   Socioeconomic History  . Marital status: Married    Spouse name: Not on file  . Number of children: 2  . Years of education: Not on file  . Highest education level: Not on file  Occupational History    Employer: Retired  Tobacco Use  . Smoking status: Former Smoker    Packs/day: 2.00    Years: 40.00    Pack years: 80.00    Types: Cigarettes    Quit date: 02/15/2005    Years since quitting: 14.5  . Smokeless tobacco: Never Used  Substance and Sexual Activity  . Alcohol use: Yes    Alcohol/week: 0.0 standard drinks    Comment: Occasional  . Drug use: No  . Sexual activity: Yes    Partners: Female  Other Topics Concern  . Not on file  Social History Narrative   Exercise--  Walks dogs everyday      LIves with wife , no stairs in home, caffeine - one cup coffee day, exercise - not much, Right handed, 12th grade, retired      One story home   Social Determinants of Health   Financial Resource Strain:   . Difficulty of Paying Living Expenses: Not on file  Food Insecurity:   . Worried About Charity fundraiser in the Last Year: Not on file  . Ran Out of Food in the Last Year: Not on file  Transportation Needs:   . Lack of Transportation (Medical): Not on file  . Lack of Transportation (Non-Medical): Not on file  Physical Activity:   . Days of Exercise per  Week: Not on file  . Minutes of Exercise per Session: Not on file  Stress:   . Feeling of Stress : Not on file  Social Connections:   . Frequency of Communication with Friends and Family: Not on file  . Frequency of Social Gatherings with Friends and Family: Not on file  . Attends Religious Services: Not on file  . Active Member of Clubs or Organizations: Not on file  . Attends Archivist Meetings: Not on file  . Marital Status: Not on file  Intimate Partner Violence:   . Fear of Current or Ex-Partner: Not on file  . Emotionally Abused: Not on file  . Physically Abused: Not on file  . Sexually Abused: Not on file    Observations/Objective:   Height 5\' 11"  (1.803 m), weight 218 lb (98.9 kg).  Assessment and Plan:   1.  Acute neurologic change.  Worsening left sided and cognitive deficits.  Given the acute onset of symptoms, need to evaluate urgently for new/recent stroke.  Also, possible that it is progression of dementia as well.  1.  MRI of brain without contrast urgently.  Further recommendations pending results. 2.  I would like to see him in the office immediately afterwards for physical exam.  Follow Up Instructions:    -I discussed the assessment and treatment plan with the patient. The patient was provided an opportunity to ask questions and all were answered. The patient agreed with the plan and demonstrated an understanding of the instructions.   The  patient was advised to call back or seek an in-person evaluation if the symptoms worsen or if the condition fails to improve as anticipated.    Total Time spent in visit with the patient was:  12 minutes  Dudley Major, DO

## 2019-08-15 ENCOUNTER — Encounter: Payer: Self-pay | Admitting: Neurology

## 2019-08-15 ENCOUNTER — Telehealth (INDEPENDENT_AMBULATORY_CARE_PROVIDER_SITE_OTHER): Payer: PPO | Admitting: Neurology

## 2019-08-15 ENCOUNTER — Other Ambulatory Visit: Payer: Self-pay

## 2019-08-15 ENCOUNTER — Telehealth: Payer: Self-pay

## 2019-08-15 ENCOUNTER — Ambulatory Visit (HOSPITAL_COMMUNITY)
Admission: RE | Admit: 2019-08-15 | Discharge: 2019-08-15 | Disposition: A | Discharge status: Home / Self Care | Payer: PPO | Source: Ambulatory Visit | Attending: Neurology | Admitting: Neurology

## 2019-08-15 ENCOUNTER — Ambulatory Visit (HOSPITAL_COMMUNITY): Payer: PPO | Admitting: Psychiatry

## 2019-08-15 VITALS — Ht 71.0 in | Wt 218.0 lb

## 2019-08-15 DIAGNOSIS — R410 Disorientation, unspecified: Secondary | ICD-10-CM | POA: Diagnosis not present

## 2019-08-15 DIAGNOSIS — R531 Weakness: Secondary | ICD-10-CM | POA: Diagnosis not present

## 2019-08-15 DIAGNOSIS — I639 Cerebral infarction, unspecified: Secondary | ICD-10-CM | POA: Diagnosis not present

## 2019-08-15 NOTE — Telephone Encounter (Signed)
-----   Message from Pieter Partridge, DO sent at 08/15/2019  9:01 AM EST ----- MRI of brain without contrast for stroke.  Urgent.  Wherever is the soonest appointment ([place], [facility], [place])

## 2019-08-15 NOTE — Telephone Encounter (Signed)
Mri stat to be done at [place] [place] patient to arrive in [facility], no prior authorization needed. Patient to go there shortly.

## 2019-08-15 NOTE — Telephone Encounter (Signed)
Non acute stable MRI, results to come over, Verbal stable appearance since 2015.

## 2019-08-16 ENCOUNTER — Encounter: Payer: Self-pay | Admitting: Physical Therapy

## 2019-08-16 ENCOUNTER — Other Ambulatory Visit: Payer: Self-pay

## 2019-08-16 ENCOUNTER — Ambulatory Visit: Payer: PPO | Admitting: Physical Therapy

## 2019-08-16 DIAGNOSIS — M79605 Pain in left leg: Secondary | ICD-10-CM

## 2019-08-16 DIAGNOSIS — R262 Difficulty in walking, not elsewhere classified: Secondary | ICD-10-CM

## 2019-08-16 DIAGNOSIS — M6281 Muscle weakness (generalized): Secondary | ICD-10-CM

## 2019-08-16 NOTE — Therapy (Signed)
[place], [name], [address] Phone: [phone]   Fax:  [phone]  Physical Therapy Treatment  Patient Details  Name: William [name] MRN: [id number] Date of Birth: Mar 16, 1941 Referring Provider (PT): Jaffey   Encounter Date: 08/16/2019  PT End of Session - 08/16/19 1448    Visit Number  [id number]    PT Start Time  3536    PT Stop Time  1443    PT Time Calculation (min)  50 min    Activity Tolerance  Patient tolerated treatment well    Behavior During Therapy  [facility] for tasks assessed/performed       Past Medical History:  Diagnosis Date  . ANXIETY   . Arthritis    low back  . Bladder stone   . BPH (benign prostatic hypertrophy)   . Carotid artery occlusion   . 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 CVD (cerebrovascular disease)   . Eczema   . GERD (gastroesophageal reflux disease)   . History of carotid artery stenosis    S/P BILATERAL CEA  . 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 Hyperlipidemia   . Hypertension   . Nocturia   . S/P carotid endarterectomy    BILATERAL ICA--  PATENT PER DUPLEX  05-19-2012  . Squamous cell carcinoma    right calf  . Stroke [facility]) 1996   pt states no weaknes but has severe anxiety  . Urinary frequency   . Vitamin D deficiency     Past Surgical History:  Procedure Laterality Date  . APPENDECTOMY  AS CHILD  . CARDIOVASCULAR STRESS TEST  03-27-2012  DR CRENSHAW   LOW RISK LEXISCAN STUDY-- PROBABLE NORMAL PERFUSION AND SOFT TISSUE ATTENUATION/  NO ISCHEMIA/ EF 51%  . CAROTID ENDARTERECTOMY Bilateral LEFT  11-12-2008  DR GREG HAYES   RIGHT ICA  2006  ([facility])  . CYSTOSCOPY WITH LITHOLAPAXY N/A 02/26/2013   Procedure: CYSTOSCOPY WITH LITHOLAPAXY;  Surgeon: Franchot Gallo, MD;  Location: [facility];  Service: Urology;  Laterality: N/A;  . EYE SURGERY  Jan. 2016   cataract surgery both eyes  .  INGUINAL HERNIA REPAIR Right 11-08-2006  . MASS EXCISION N/A 03/03/2016   Procedure: EXCISION OF BACK  MASS;  Surgeon: Stark Klein, MD;  Location: [place];  Service: General;  Laterality: N/A;  . 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 PROSTATE SURGERY    . TRANSURETHRAL RESECTION OF PROSTATE N/A 02/26/2013   Procedure: TRANSURETHRAL RESECTION OF THE PROSTATE WITH GYRUS INSTRUMENTS;  Surgeon: Franchot Gallo, MD;  Location: [facility];  Service: Urology;  Laterality: N/A;    There were no vitals filed for this visit.  Subjective Assessment - 08/16/19 1401    Subjective  Patient did a web MD visit on Tuesday and then had MRI on Wednesday, MD reported no new damage to the brain, but will have an in person MD visit in the near future    Currently in Pain?  Yes    Pain Score  0-No pain    Pain Location  Back                       [facility] Adult PT Treatment/Exercise - 08/16/19 0001      Ambulation/Gait   Gait Comments  gait around the building with 1 rest  and then some cues to slow down especially on the hills      High Level Balance   High Level Balance Comments  seated ball batting back and forth and performed in standing as well, then ball throwing with the left arm seated and in standing, used various size balls      Knee/Hip Exercises: Aerobic   Other Aerobic  UBE level 5 x 6 minutes, much better today with the hand on the handle      Knee/Hip Exercises: Standing   Hip Flexion  Both;2 sets;10 reps    Hip Flexion Limitations  5    Hip Abduction  Both;2 sets;10 reps    Abduction Limitations  5    Hip Extension  Both;2 sets;10 reps    Extension Limitations  5      Knee/Hip Exercises: Seated   Long Arc Quad  Both;2 sets;10 reps    Long Arc Quad Weight  5 lbs.    Knee/Hip Flexion  overhead press 5#, then 5# hip to hip for core, 6# biceps, 3# alternating punches, bosu behind partial sit ups    Other Seated Knee/Hip  Exercises  green tband ankle exercises    Marching  Both;2 sets;10 reps    Marching Weights  5 lbs.    Sit to Sand  15 reps;without UE support      Knee/Hip Exercises: Supine   Short Arc Quad Sets  Both;2 sets;10 reps    Short Arc Quad Sets Limitations  5    Other Supine Knee/Hip Exercises  trunk rotation with him holding weight in both hands at the chest               PT Short Term Goals - 04/20/19 0920      PT SHORT TERM GOAL #1   Title  independent with initial HEP    Status  Partially Met        PT Long Term Goals - 08/16/19 1703      PT LONG TERM GOAL #1   Title  Understand fall risks, the importance of exercise and flexibility to health and function    Status  Achieved      PT LONG TERM GOAL #2   Title  decrease TUG time to 16 seconds    Status  Achieved      PT LONG TERM GOAL #3   Title  walk around our building without rest     Status  Partially Met      PT LONG TERM GOAL #4   Title  decrease pain with walking by 50%    Status  Achieved      PT LONG TERM GOAL #5   Title  increase lumbar ROM 25%    Status  Achieved            Plan - 08/16/19 1448    Clinical Impression Statement  Patient again doing very well and has met most of the goals, he is walking again, had a scare last week with the left arm not working, new MRI was negative    PT Next Visit Plan  D/C he is to try to walk the dogs daily as the weather is getting better, gave him exercises to do in sitting asked him to try to dork on th eleft hand some    Consulted and Agree with Plan of Care  Patient;Family member/caregiver    Family Member Consulted  spouse       Patient will benefit  from skilled therapeutic intervention in order to improve the following deficits and impairments:  Abnormal gait, Decreased cognition, Cardiopulmonary status limiting activity, Decreased activity tolerance, Decreased endurance, Decreased range of motion, Decreased strength, Decreased balance, Difficulty  walking, Decreased safety awareness, Improper body mechanics, Decreased mobility  Visit Diagnosis: Difficulty in walking, not elsewhere classified  Muscle weakness (generalized)  Pain in left leg     Problem List Patient Active Problem List   Diagnosis Date Noted  . Ingrown toenail 07/13/2019  . Lumbar spondylosis 05/02/2018  . Black head 04/28/2018  . Pain in left knee 03/09/2018  . 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 Trochanteric bursitis of left hip 01/16/2018  . 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 Preventative health care 09/26/2017  . HTN (hypertension) 07/19/2015  . Hyperlipidemia 07/19/2015  . Closed fracture of fifth metacarpal bone 05/15/2015  . 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  . Great toe pain 02/11/2014  . Dementia with behavioral disturbance ([place]) 01/09/2014  . History of right MCA stroke 11/09/2013  . Obesity (BMI 30-39.9) 06/25/2013  . Renal insufficiency 06/25/2013  . Weakness of left arm 06/25/2013  . Aftercare following surgery of the circulatory system, [place] 05/17/2013  . Confusion 04/17/2012  . Sebaceous cyst 03/03/2011  . LUMBAR SPRAIN AND STRAIN 07/31/2010  . URINARY FREQUENCY 09/25/2009  . RIB PAIN, LEFT SIDED 08/29/2009  . DEPRESSION/ANXIETY 08/01/2009  . CAROTID ARTERY STENOSIS, BILATERAL 05/02/2009  . DIZZINESS 05/02/2009  . HEADACHE 05/02/2009  . ECZEMA, ATOPIC 05/31/2008  . Vitamin D deficiency 03/01/2008  . FATIGUE 01/26/2008  . BPH (benign prostatic hyperplasia) 08/06/2007  . ANXIETY 12/21/2006  . HOARSENESS 12/21/2006  . FASTING HYPERGLYCEMIA 12/21/2006  . Hyperlipidemia LDL goal <100 09/27/2006  . Unspecified cerebral artery occlusion with cerebral infarction 09/27/2006    Sumner Boast., PT 08/16/2019, 5:04 PM  [place], [name], [address] Phone: [phone]   Fax:   [phone]  Name: William [name] MRN: [id number] Date of Birth: [birth year]

## 2019-08-20 ENCOUNTER — Ambulatory Visit: Payer: PPO | Admitting: Podiatry

## 2019-08-20 ENCOUNTER — Other Ambulatory Visit: Payer: Self-pay

## 2019-08-20 ENCOUNTER — Encounter: Payer: Self-pay | Admitting: Podiatry

## 2019-08-20 DIAGNOSIS — B351 Tinea unguium: Secondary | ICD-10-CM | POA: Diagnosis not present

## 2019-08-20 DIAGNOSIS — M79676 Pain in unspecified toe(s): Secondary | ICD-10-CM

## 2019-08-21 NOTE — Progress Notes (Signed)
Subjective: 79 y.o. returns the office today for painful, elongated, thickened toenails which he cannot trim himself.he states that the left big toenail is doing better.  Currently denies any drainage or pus.  Denies any open lesions.  He has no other concerns today. Denies any systemic complaints such as fevers, chills, nausea, vomiting.   PCP: Carollee Herter, Alferd Apa, DO  Objective: NAD, presents with his wife DP/PT pulses palpable, CRT less than 3 seconds  Nails hypertrophic, dystrophic, elongated, brittle, discolored x 10. There is tenderness overlying the nails 1-5 bilaterally.  There is still some scab present on the left hallux nail borders.  There is no drainage or pus or any clinical signs of infection. There is no surrounding erythema or drainage along the nail sites. No open lesions or pre-ulcerative lesions are identified. No pain with calf compression, swelling, warmth, erythema.  Assessment: Patient presents with symptomatic onychomycosis  Plan: -Treatment options including alternatives, risks, complications were discussed -Nails debrided x 10 without any complications or bleeding   Return in about 9 weeks (around 10/22/2019) for nail trim .   Celesta Gentile, DPM

## 2019-08-24 ENCOUNTER — Other Ambulatory Visit: Payer: Self-pay

## 2019-08-24 ENCOUNTER — Ambulatory Visit (INDEPENDENT_AMBULATORY_CARE_PROVIDER_SITE_OTHER): Payer: PPO | Admitting: Psychiatry

## 2019-08-24 DIAGNOSIS — F0630 Mood disorder due to known physiological condition, unspecified: Secondary | ICD-10-CM

## 2019-08-24 DIAGNOSIS — F0631 Mood disorder due to known physiological condition with depressive features: Secondary | ICD-10-CM

## 2019-08-24 MED ORDER — CARBAMAZEPINE ER 100 MG PO TB12: ORAL_TABLET | ORAL | 2 refills | Status: DC

## 2019-08-24 MED ORDER — ESZOPICLONE 3 MG PO TABS: ORAL_TABLET | ORAL | 2 refills | Status: DC

## 2019-08-24 MED ORDER — LORAZEPAM 0.5 MG PO TABS: ORAL_TABLET | ORAL | 5 refills | Status: DC

## 2019-08-24 MED FILL — carBAMazepine ER 100 MG TB1: 100 | 90 days supply | Qty: 180 | Fill #0

## 2019-08-24 NOTE — Progress Notes (Signed)
Patient ID: William [name], male   DOB: 03-13-1941, 79 y.o.   MRN: [id number] [facility] MD Progress Note  08/24/2019 10:45 AM William [name]  MRN:  [id number] Subjective:  Feeling good Principal Problem: Mild neurocognitive disorder  The patient is interviewed with his wife.  The patient is somewhat different.  It is subtle.  He cries easier.  He watches something on TV.  It makes him cry.  He is not overly depressed.  In fact he denies being depressed.  He still enjoys things in his environment.  His biggest stressor is his dog who apparently is slowly dying.  Dog is comfortable not in pain and still active but apparently is in kidney failure.  He is not eating.  And apparently they have been making arrangements for the doggy hospice to eventually in the near future to put him asleep in their own home.  This is inevitable but is not going to happen right away.  I suspect this makes the patient very depressed and anxious.  I do not think he is clinically depressed.  The patient still sleeps and eats and has good energy.  Sleeping is improved and better on Lunesta.  Patient denies use of alcohol or drugs.  He is not irritable.  He is not explosive.  The Tegretol is still very beneficial to him.  Patient takes a low-dose of Ativan as well. Past Medical History:  Past Medical History:  Diagnosis Date  . ANXIETY   . Arthritis    low back  . Bladder stone   . BPH (benign prostatic hypertrophy)   . Carotid artery occlusion   . 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 CVD (cerebrovascular disease)   . Eczema   . GERD (gastroesophageal reflux disease)   . History of carotid artery stenosis    S/P BILATERAL CEA  . 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 Hyperlipidemia   . Hypertension   . Nocturia   . S/P carotid endarterectomy    BILATERAL ICA--  PATENT PER DUPLEX  05-19-2012  . Squamous cell carcinoma    right calf  . Stroke [facility]) 1996   pt states no  weaknes but has severe anxiety  . Urinary frequency   . Vitamin D deficiency     Past Surgical History:  Procedure Laterality Date  . APPENDECTOMY  AS CHILD  . CARDIOVASCULAR STRESS TEST  03-27-2012  DR CRENSHAW   LOW RISK LEXISCAN STUDY-- PROBABLE NORMAL PERFUSION AND SOFT TISSUE ATTENUATION/  NO ISCHEMIA/ EF 51%  . CAROTID ENDARTERECTOMY Bilateral LEFT  11-12-2008  DR GREG HAYES   RIGHT ICA  2006  ([facility])  . CYSTOSCOPY WITH LITHOLAPAXY N/A 02/26/2013   Procedure: CYSTOSCOPY WITH LITHOLAPAXY;  Surgeon: Franchot Gallo, MD;  Location: [facility];  Service: Urology;  Laterality: N/A;  . EYE SURGERY  Jan. 2016   cataract surgery both eyes  . INGUINAL HERNIA REPAIR Right 11-08-2006  . MASS EXCISION N/A 03/03/2016   Procedure: EXCISION OF BACK  MASS;  Surgeon: Stark Klein, MD;  Location: [place];  Service: General;  Laterality: N/A;  . 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 PROSTATE SURGERY    . TRANSURETHRAL RESECTION OF PROSTATE N/A 02/26/2013   Procedure: TRANSURETHRAL RESECTION OF THE PROSTATE WITH GYRUS INSTRUMENTS;  Surgeon: Franchot Gallo, MD;  Location: [facility];  Service: Urology;  Laterality: N/A;  Family History:  Family History  Problem Relation Age of Onset  . Heart disease Mother        CHF  . Bipolar disorder Mother   . Heart disease Father        CHF   Family Psychiatric  History:  Social History:  Social History   Substance and Sexual Activity  Alcohol Use Yes  . Alcohol/week: 0.0 standard drinks   Comment: Occasional     Social History   Substance and Sexual Activity  Drug Use No    Social History   Socioeconomic History  . Marital status: Married    Spouse name: Not on file  . Number of children: 2  . Years of education: Not on file  . Highest education level: Not on file  Occupational History    Employer: Retired  Tobacco Use  . Smoking status: Former Smoker    Packs/day:  2.00    Years: 40.00    Pack years: 80.00    Types: Cigarettes    Quit date: 02/15/2005    Years since quitting: 14.5  . Smokeless tobacco: Never Used  Substance and Sexual Activity  . Alcohol use: Yes    Alcohol/week: 0.0 standard drinks    Comment: Occasional  . Drug use: No  . Sexual activity: Yes    Partners: Female  Other Topics Concern  . Not on file  Social History Narrative   Exercise--  Walks dogs everyday      LIves with wife , no stairs in home, caffeine - one cup coffee day, exercise - not much, Right handed, 12th grade, retired      One story home   Social Determinants of Health   Financial Resource Strain:   . Difficulty of Paying Living Expenses:   Food Insecurity:   . Worried About Charity fundraiser in the Last Year:   . Arboriculturist in the Last Year:   Transportation Needs:   . Film/video editor (Medical):   [name] Lack of Transportation (Non-Medical):   Physical Activity:   . Days of Exercise per Week:   . Minutes of Exercise per Session:   Stress:   . Feeling of Stress :   Social Connections:   . Frequency of Communication with Friends and Family:   . Frequency of Social Gatherings with Friends and Family:   . Attends Religious Services:   . Active Member of Clubs or Organizations:   . Attends Archivist Meetings:   [name] Marital Status:    Additional Social History:                         Sleep: Good  Appetite:  Good  Current Medications: Current Outpatient Medications  Medication Sig Dispense Refill  . acetaminophen (TYLENOL) 500 MG tablet Take 500 mg by mouth every 6 (six) hours as needed (1 tab as needed).    [name] amLODipine (NORVASC) 5 MG tablet TAKE 1 TABLET (5 MG TOTAL) BY MOUTH DAILY. 90 tablet 1  . aspirin 325 MG EC tablet Take 325 mg by mouth daily.    . carbamazepine (TEGRETOL-XR) 100 MG 12 hr tablet 1 qam  1  qhs 180 tablet 2  . Cholecalciferol (VITAMIN D) 2000 UNITS CAPS Take by mouth.    . docusate sodium  (COLACE) 100 MG capsule Take 100 mg by mouth daily.    . Eszopiclone 3 MG TABS TAKE 1 TABLET BY MOUTH AT BEDTIME  AS NEEDED TAKE IMMEDIATELY BEFORE BEDTIME 90 tablet 2  . ezetimibe (ZETIA) 10 MG tablet TAKE 1 TABLET (10 MG TOTAL) BY MOUTH DAILY. 90 tablet 3  . famotidine (PEPCID) 20 MG tablet Take 20 mg by mouth 2 (two) times daily.    . fenofibrate 160 MG tablet TAKE 1 TABLET BY MOUTH DAILY. REPEAT LABS ARE DUE NOW 90 tablet 1  . Flaxseed, Linseed, (FLAX SEED OIL PO) Take by mouth daily.      . fluticasone (CUTIVATE) 0.05 % cream   3  . lisinopril-hydrochlorothiazide (ZESTORETIC) 10-12.5 MG tablet Take 1 tablet by mouth daily.    [name] LORazepam (ATIVAN) 0.5 MG tablet 2  Bid  1 as needed 150 tablet 5  . Multiple Vitamin (MULTIVITAMIN) tablet Take 1 tablet by mouth daily.    . NONFORMULARY OR COMPOUNDED ITEM [facility]:  Onychomycosis Nail lacquer - Fluconazole 2%, Terbinafine 1%, DMSO, apply to affected area daily. 120 each 2  . NONFORMULARY OR COMPOUNDED ITEM [facility]:  Antifungal topical - Terbinafine 3%, Fluconazole 2%, Tea Tree Oil 5%, Urea 10/%,, Ibuprofen 2% in DMSO Suspension #53ml. Apply to the affected nail(s) once (at bedtime) or twice daily. 100 each 2  . NONFORMULARY OR COMPOUNDED ITEM Compression socks  #1  Dx low ext edema and ache 1 each 0  . tamsulosin (FLOMAX) 0.4 MG CAPS capsule Take 1 capsule (0.4 mg total) by mouth daily. 90 capsule 3  . traMADol (ULTRAM) 50 MG tablet Take 1 tablet (50 mg total) by mouth every 8 (eight) hours as needed. 30 tablet 0  . Zoster Vaccine Adjuvanted [facility]) injection 0.5 ml IM x1 repeat in 2 months 0.5 mL 1   No current facility-administered medications for this visit.    Lab Results:  No results found for this or any previous visit (from the past 48 hour(s)).  Physical Findings: AIMS:  , ,  ,  ,    CIWA:    COWS:     Musculoskeletal: Strength & Muscle Tone: within normal limits Gait & Station: normal Patient leans:  Right  Psychiatric Specialty Exam: ROS  There were no vitals taken for this visit.There is no height or weight on file to calculate BMI.  General Appearance: Casual  Eye Contact::  Good  Speech:  Clear and Coherent  Volume:  Normal  Mood:  Dysphoric and Euthymic  Affect:  Appropriate  Thought Process:  Coherent  Orientation:  Full (Time, Place, and Person)  Thought Content:  WDL  Suicidal Thoughts:  No  Homicidal Thoughts:  No  Memory:  NA  Judgement:  Good  Insight:  Fair  Psychomotor Activity:  Normal  Concentration:  Good  Recall:  Good  Fund of Knowledge:Good  Language: Good  Akathisia:  No  Handed:  Right  AIMS (if indicated):     Assets:  Desire for Improvement  ADL's:  Intact  Cognition: WNL  Sleep:      Treatment Plan Summary: 08/24/2019, 10:45 AM   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  The patient takes Tegretol which helps him [name] his mood and reduce his irritability.  I think he likely is more emotional given the fact of the pandemic and also the fact that his dog is dying.  His second problem is insomnia.  He takes [place] and gets a good response.  His third problem is an adjustment disorder with an anxious mood state where he takes a low-dose of Ativan.  Patient's blood work  all came back and we reviewed it.  All his blood levels are normal.  He has a relatively low Tegretol level but is still effective.  Demonstrates no evidence of anemia or thyroid disorders.  The patient was seen by [facility] and had an MRI of his brain that came back unchanged.  The patient is in physical therapy at this time and is working on some targets.  Over the all emotionally he is appropriate not out-of-control certainly not suicidal.  I think the patient would benefit by being seen in person in 2 months.  We will continue all the medications as prescribed for now.

## 2019-08-27 MED FILL — ESZOPICLONE 3 MG TABS: 3 | 90 days supply | Qty: 90 | Fill #0

## 2019-09-10 MED FILL — LISINOPRIL-HCTZ 10-12.5 MG: 10-12.5 | 30 days supply | Qty: 30 | Fill #5

## 2019-09-11 ENCOUNTER — Other Ambulatory Visit: Payer: Self-pay

## 2019-09-11 ENCOUNTER — Ambulatory Visit (INDEPENDENT_AMBULATORY_CARE_PROVIDER_SITE_OTHER): Payer: PPO | Admitting: Family Medicine

## 2019-09-11 ENCOUNTER — Encounter: Payer: Self-pay | Admitting: Family Medicine

## 2019-09-11 VITALS — BP 137/77 | Temp 97.3°F | Ht 71.0 in | Wt 216.2 lb

## 2019-09-11 DIAGNOSIS — R42 Dizziness and giddiness: Secondary | ICD-10-CM

## 2019-09-11 DIAGNOSIS — R110 Nausea: Secondary | ICD-10-CM

## 2019-09-11 MED ORDER — ONDANSETRON 4 MG PO TBDP: 4.0000 mg | ORAL_TABLET | Freq: Three times a day (TID) | ORAL | 0 refills | Status: DC | PRN

## 2019-09-11 MED FILL — ONDANSETRON ODT 4MG TBDP: 4 | 7 days supply | Qty: 20 | Fill #0

## 2019-09-11 NOTE — Progress Notes (Signed)
Virtual Visit via Telephone Note  I connected with William [name] on 09/11/19 at  2:20 PM EDT by telephone and verified that I am speaking with the correct person using two identifiers.  Location: Patient: home with wife  Provider: office    I discussed the limitations, risks, security and privacy concerns of performing an evaluation and management service by telephone and the availability of in person appointments. I also discussed with the patient that there may be a patient responsible charge related to this service. The patient expressed understanding and agreed to proceed.   History of Present Illness: Pt is home with his wife --- c/o N/V since Saturday.   Last time he threw up was on Saturday  He also had dizziness He had an episode of near syncope and confusion Saturday His wife got him to the bed and he asked for sprite and felt better     Observations/Objective: Vitals:   09/11/19 1420  BP: 137/77  Temp: (!) 97.3 F (36.3 C)  wt 216 lbs    Pt is in NAD  No sob   Assessment and Plan: 1. Dizziness Check labs  zofran for nausea prn If no bettter ER  - CBC with Differential/Platelet; Future - Comprehensive metabolic panel; Future - POCT Urinalysis Dipstick (Automated); Future  2. Nauseous zofran prn Advance diet as tolerated No vomiting since Sat and pt has been eating Check labs and urine  - CBC with Differential/Platelet; Future - Comprehensive metabolic panel; Future - POCT Urinalysis Dipstick (Automated); Future - ondansetron (ZOFRAN ODT) 4 MG disintegrating tablet; Take 1 tablet (4 mg total) by mouth every 8 (eight) hours as needed for nausea or vomiting.  Dispense: 20 tablet; Refill: 0   Follow Up Instructions:    I discussed the assessment and treatment plan with the patient. The patient was provided an opportunity to ask questions and all were answered. The patient agreed with the plan and demonstrated an understanding of the instructions.   The  patient was advised to call back or seek an in-person evaluation if the symptoms worsen or if the condition fails to improve as anticipated.  I provided 30 minutes of non-face-to-face time during this encounter.   Ann Held, DO

## 2019-09-12 ENCOUNTER — Telehealth: Payer: Self-pay | Admitting: Pharmacist

## 2019-09-12 ENCOUNTER — Other Ambulatory Visit (INDEPENDENT_AMBULATORY_CARE_PROVIDER_SITE_OTHER): Payer: PPO

## 2019-09-12 ENCOUNTER — Other Ambulatory Visit: Payer: Self-pay

## 2019-09-12 DIAGNOSIS — R110 Nausea: Secondary | ICD-10-CM

## 2019-09-12 DIAGNOSIS — R42 Dizziness and giddiness: Secondary | ICD-10-CM | POA: Diagnosis not present

## 2019-09-12 LAB — POC URINALSYSI DIPSTICK (AUTOMATED)
Bilirubin, UA: NEGATIVE
Blood, UA: NEGATIVE
Glucose, UA: NEGATIVE
Ketones, UA: NEGATIVE
Leukocytes, UA: NEGATIVE
Nitrite, UA: NEGATIVE
Protein, UA: POSITIVE — AB
Spec Grav, UA: 1.025 (ref 1.010–1.025)
Urobilinogen, UA: 0.2 E.U./dL
pH, UA: 5 (ref 5.0–8.0)

## 2019-09-12 LAB — CBC WITH DIFFERENTIAL/PLATELET
Basophils Absolute: 0.1 10*3/uL (ref 0.0–0.1)
Basophils Relative: 1 % (ref 0.0–3.0)
Eosinophils Absolute: 0.6 10*3/uL (ref 0.0–0.7)
Eosinophils Relative: 7.4 % — ABNORMAL HIGH (ref 0.0–5.0)
HCT: 37.5 % — ABNORMAL LOW (ref 39.0–52.0)
Hemoglobin: 12.6 g/dL — ABNORMAL LOW (ref 13.0–17.0)
Lymphocytes Relative: 26.9 % (ref 12.0–46.0)
Lymphs Abs: 2.3 10*3/uL (ref 0.7–4.0)
MCHC: 33.5 g/dL (ref 30.0–36.0)
MCV: 94.6 fl (ref 78.0–100.0)
Monocytes Absolute: 0.7 10*3/uL (ref 0.1–1.0)
Monocytes Relative: 7.9 % (ref 3.0–12.0)
Neutro Abs: 4.8 10*3/uL (ref 1.4–7.7)
Neutrophils Relative %: 56.8 % (ref 43.0–77.0)
Platelets: 281 10*3/uL (ref 150.0–400.0)
RBC: 3.96 Mil/uL — ABNORMAL LOW (ref 4.22–5.81)
RDW: 13.5 % (ref 11.5–15.5)
WBC: 8.5 10*3/uL (ref 4.0–10.5)

## 2019-09-12 LAB — COMPREHENSIVE METABOLIC PANEL
ALT: 58 U/L — ABNORMAL HIGH (ref 0–53)
AST: 29 U/L (ref 0–37)
Albumin: 4.1 g/dL (ref 3.5–5.2)
Alkaline Phosphatase: 71 U/L (ref 39–117)
BUN: 35 mg/dL — ABNORMAL HIGH (ref 6–23)
CO2: 27 mEq/L (ref 19–32)
Calcium: 9.4 mg/dL (ref 8.4–10.5)
Chloride: 103 mEq/L (ref 96–112)
Creatinine, Ser: 1.97 mg/dL — ABNORMAL HIGH (ref 0.40–1.50)
GFR: 33.12 mL/min — ABNORMAL LOW (ref >60.00)
Glucose, Bld: 140 mg/dL — ABNORMAL HIGH (ref 70–99)
Potassium: 3.8 mEq/L (ref 3.5–5.1)
Sodium: 140 mEq/L (ref 135–145)
Total Bilirubin: 0.4 mg/dL (ref 0.2–1.2)
Total Protein: 7.2 g/dL (ref 6.0–8.3)

## 2019-09-12 NOTE — Chronic Care Management (AMB) (Signed)
Called patient for HTN follow up. Spoke to his wife William [name].  Noted that patient had an incident of acute confusion. There was concern for a stroke. Brain MRI unremarkable from previous. She reports the following AM and PM BP for the patient.   AM BP 146 75 107 62 137 77 133 71 159 84  157 91 Average 139.8 76.6  PM BP 140 78 130 78 159 87 163 83 128 72  139 80 Average 143.1 79.6  Noted that patient came in today for lab work. Briefly reviewed results, but stated that official results would be interpreted after Dr. Nonda Lou review.   Wondered if pt is becoming dehydrated. BUN/SCr from most recent lab <20. William [name] does note that the patient has been drinking soda more than water.  She also states the patient has taken the passing of their dog pretty hard.  No major concern for BP. Continue current regimen and follow up as needed. Next CCM visit scheduled for 01/28/2020. Encouraged William [name] to call me if she or patient has any further medication questions until that time.

## 2019-09-12 NOTE — Progress Notes (Signed)
Yellow °Clear ° °

## 2019-09-17 ENCOUNTER — Telehealth: Payer: Self-pay | Admitting: *Deleted

## 2019-09-17 ENCOUNTER — Encounter: Payer: Self-pay | Admitting: Family Medicine

## 2019-09-17 ENCOUNTER — Other Ambulatory Visit: Payer: PPO

## 2019-09-17 DIAGNOSIS — R739 Hyperglycemia, unspecified: Secondary | ICD-10-CM

## 2019-09-17 NOTE — Telephone Encounter (Signed)
-----   Message from Ann Held, DO sent at 09/17/2019 11:07 AM EDT ----- Can we add a [id number] ?   Due to hyperglycemia?

## 2019-09-17 NOTE — Telephone Encounter (Signed)
Unable to add test inhouse. Will send request to send to [facility]. Future order has been placed and requested sent to [place].

## 2019-09-17 NOTE — Telephone Encounter (Signed)
See labs 

## 2019-09-18 LAB — HEMOGLOBIN A1C
Hgb A1c MFr Bld: 5.8 % of total Hgb — ABNORMAL HIGH (ref <5.7)
Mean Plasma Glucose: 120 (calc)
eAG (mmol/L): 6.6 (calc)

## 2019-09-18 MED FILL — FENOFIBRATE 160 MG TABLET: 160 | 90 days supply | Qty: 90 | Fill #1

## 2019-09-20 ENCOUNTER — Encounter: Payer: Self-pay | Admitting: Family Medicine

## 2019-09-21 ENCOUNTER — Other Ambulatory Visit: Payer: Self-pay | Admitting: Family Medicine

## 2019-09-21 DIAGNOSIS — Z91030 Bee allergy status: Secondary | ICD-10-CM

## 2019-09-21 MED FILL — EPINEPHRINE 0.3 MG AUTO-INJ: 0.3 | 2 days supply | Qty: 2 | Fill #0

## 2019-09-24 MED FILL — LORazepam 0.5 MG TABS: 0.5 | 30 days supply | Qty: 150 | Fill #1

## 2019-09-28 NOTE — Progress Notes (Addendum)
NEUROLOGY FOLLOW UP OFFICE NOTE  ERWIN NISHIYAMA [id number]  HISTORY OF PRESENT ILLNESS: William [name] is a 79 year old manwith dementia, CKD, HTN, CAD, HLD and history of CVAwho follows up for cognitive changes.He is accompanied by his wife who supplements history.  UPDATE: Current medications:  Tegretol-XR 100mg  at bedtime; lorazepam PRN; tramadol 50mg  Q8h PRN  He developed increased confusion in February.  At physical therapy, he was noted to have increased difficulty using his left side (affected side from his stroke).  He had difficulty performing finger-thumb tapping and had trouble gripping the handle of the exercise machine with his left hand.  He also has had acute cognitive changes.  He started having trouble dialing his daughter's phone number which is new.  MRI of brain without contrast performed on 08/15/2019 was personally reviewed and showed remote large right hemispheric stroke and chronic small vessel ischemic changes, stable compared to prior MRI from 2015, no acute intracranial abnormality.  He is still in physical therapy.  He still has trouble walking.  He has low back and bilateral leg pain with prolonged walking.  His favorite dog passed away, which has caused increased anxiety and trouble sleeping.  He also has been more easily irritable.  He is followed by his psychiatrist, Dr. Casimiro Needle, who started him on carbamazepine.   HISTORY: 1.  Bilateral Leg Pain & Weakness: Prior to COVID, he was going to the gym and seeing a personal trainer but it stopped once the pandemic hit. Since the pandemic, he has been sedentary. He walks the dog once or twice a week. He started having bilateral leg pain later that year in 2020. No associated back pain. No shooting pain. It is described as an aching in his calves. No associated numbness. He also reports weakness. When he walks, he has trouble picking up his legs. He seems to shuffle his feet. When he gets up to stand, he  feels like he can fall forward. He had one fall while standing at the toilet and he bent forward to spit in toilet. No change in bowel habit. He was having urinary issues so he was taken off of Flomax. Vascular US ABI from 12/08/18 showed abnormal toe-brachial index in both lower extremities but otherwise unremarkable.  MRI of cervical spine without contrast from 03/10/2019 showed some cervical degenerative disc disease and spondylosis with mild foraminal narrowing but no spinal stenosis or compressive myelopathy.  He had been started on new medications a few months prior. He was switched from lisinopril to Norvasc and then lisinopril was added again with HCTZ in order to help with the fluid in the legs.  He does have history of low back pain and lumbar spondylosis.  He had an epidural injection in the lumbar region performed by Dr. Nelva Bush in 2020 for low back pain which was effective.   He previously had leg pain on a statin so he was switched to Zetia. It helped for awhile.   2.  Dementia: He had a stroke in 2006, after experiencing left facial droop, left arm and leg weakness, as well slurred vision and vision problems. 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 He has some residual left sided weakness and facial weakness. He underwent left carotid endarterectomy in 2010 for asymptomatic stenosis.  Beginning around 2013, he and his wife have noted a gradual progression and cognitive deficits. Onset correlated during a period when he was experiencing dizziness and syncope secondary to orthostatic  hypotension. At that time, he was dehydrated and not drinking much fluids. He was instructed to start increasing his fluid intake as well as his sodium intake. Now, he tries to drink 3 bottles of water per day. He particularly notes confusion regarding how to perform certain everyday tasks. For example, it takes him a long time to unload the dishwasher because  he has trouble figuring out where to put the dishes. He use to enjoy cooking and preparing meals. Now he has difficulty cooking and can only see things up in the microwave. He also has been experiencing dressing apraxia. Sometimes he will put both his feet into one leg of his pants. Other times, he has difficulty buttoning his shirts. He denies any language dysfunction such as difficulty understanding other people or other people not understanding him. He has no trouble reading or writing. His short-term and long-term memory are pretty good. He denies any difficulty with face recognition. He denies hallucinations or delusions. He has not had any change in his personality or behavior. He still drives but very seldom and only locally. He has not had any problems with accidents or near accidents, but he says he drives slowly because he is very nervous and cautious. He does have history of anxiety and often shakes when he gets nervous. He denies any family history of dementia. However, there is a psychiatric history with his mother and sister. He denies history of alcohol abuse or drug use.  He underwent neuropsychological testing on 10/25/13 with Dr. Macarthur Critchley at [facility] suggested mild dementia due to his stroke, but also underlying neurodegenerative process. Memory and left hemispheric medial temporal functions were intact. Findings showed deficits in visual processing and executive dysfunction. Onset of visual-spatial deficits and apraxia may suggest occipital lobe involvement, such as due to an Alzheimer's variant called posterior cortical atrophy. MRI from May 2015 revealed global atrophy but it does not seem more prominent in the occipital lobes.  Prior medication:  Aricept (diarrhea, vivid dreams); galantamine (nausea)  12/24/13 LABS: LDL 49,  10/16/13 LABS: B12 720, TSH 1.668 10/19/13 MRI BRAIN WO: stable remote large right MCA territory infarct. Chronic microvascular ischemic  changes. 05/21/13 LABS: Hgb A1c 6 05/17/13 Carotid doppler: bilateral 1-39% stenosis of the right proximal ICA and no stenosis of the left ICA. 03/11/12 MRI Brain wo contrast: Remote large right MCA infarct. Dementia with behavioral symptoms. Etiology unclear. 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 ANXIETY   . Arthritis    low back  . Bladder stone   . BPH (benign prostatic hypertrophy)   . Carotid artery occlusion   . 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 CVD (cerebrovascular disease)   . Eczema   . GERD (gastroesophageal reflux disease)   . History of carotid artery stenosis    S/P BILATERAL CEA  . 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 Hyperlipidemia   . Hypertension   . Nocturia   . S/P carotid endarterectomy    BILATERAL ICA--  PATENT PER DUPLEX  05-19-2012  . Squamous cell carcinoma    right calf  . Stroke [facility]) 1996   pt states no weaknes but has severe anxiety  . Urinary frequency   . Vitamin D deficiency     MEDICATIONS: Current Outpatient Medications on File  Prior to Visit  Medication Sig Dispense Refill  . acetaminophen (TYLENOL) 500 MG tablet Take 500 mg by mouth every 6 (six) hours as needed (1 tab as needed).    [name] amLODipine (NORVASC) 5 MG tablet TAKE 1 TABLET (5 MG TOTAL) BY MOUTH DAILY. 90 tablet 1  . aspirin 325 MG EC tablet Take 325 mg by mouth daily.    . carbamazepine (TEGRETOL-XR) 100 MG 12 hr tablet 1 qam  1  qhs 180 tablet 2  . Cholecalciferol (VITAMIN D) 2000 UNITS CAPS Take by mouth.    . docusate sodium (COLACE) 100 MG capsule Take 100 mg by mouth daily.    . Eszopiclone 3 MG TABS TAKE 1 TABLET BY MOUTH AT BEDTIME AS NEEDED TAKE IMMEDIATELY BEFORE BEDTIME 90 tablet 2  . ezetimibe (ZETIA) 10 MG tablet TAKE 1 TABLET (10  MG TOTAL) BY MOUTH DAILY. 90 tablet 3  . famotidine (PEPCID) 20 MG tablet Take 20 mg by mouth 2 (two) times daily.    . fenofibrate 160 MG tablet TAKE 1 TABLET BY MOUTH DAILY. REPEAT LABS ARE DUE NOW 90 tablet 1  . Flaxseed, Linseed, (FLAX SEED OIL PO) Take by mouth daily.      . fluticasone (CUTIVATE) 0.05 % cream   3  . lisinopril-hydrochlorothiazide (ZESTORETIC) 10-12.5 MG tablet Take 1 tablet by mouth daily.    [name] LORazepam (ATIVAN) 0.5 MG tablet 2  Bid  1 as needed 150 tablet 5  . Multiple Vitamin (MULTIVITAMIN) tablet Take 1 tablet by mouth daily.    . NONFORMULARY OR COMPOUNDED ITEM [facility]:  Onychomycosis Nail lacquer - Fluconazole 2%, Terbinafine 1%, DMSO, apply to affected area daily. 120 each 2  . NONFORMULARY OR COMPOUNDED ITEM [facility]:  Antifungal topical - Terbinafine 3%, Fluconazole 2%, Tea Tree Oil 5%, Urea 10/%,, Ibuprofen 2% in DMSO Suspension #49ml. Apply to the affected nail(s) once (at bedtime) or twice daily. 100 each 2  . NONFORMULARY OR COMPOUNDED ITEM Compression socks  #1  Dx low ext edema and ache 1 each 0  . ondansetron (ZOFRAN ODT) 4 MG disintegrating tablet Take 1 tablet (4 mg total) by mouth every 8 (eight) hours as needed for nausea or vomiting. 20 tablet 0  . tamsulosin (FLOMAX) 0.4 MG CAPS capsule Take 1 capsule (0.4 mg total) by mouth daily. 90 capsule 3  . traMADol (ULTRAM) 50 MG tablet Take 1 tablet (50 mg total) by mouth every 8 (eight) hours as needed. 30 tablet 0   No current facility-administered medications on file prior to visit.    ALLERGIES: Allergies  Allergen Reactions  . Other Anaphylaxis  . Strawberry Extract Hives  . Adhesive [Tape] Other (See Comments)    BLISTER  . Latex     Reaction to Plastic Tape  . Statins Other (See Comments)    myalgias  . Strawberry (Diagnostic) Hives    FAMILY HISTORY: Family History  Problem Relation Age of Onset  . Heart disease Mother        CHF  . Bipolar disorder Mother   .  Heart disease Father        CHF    SOCIAL HISTORY: Social History   Socioeconomic History  . Marital status: Married    Spouse name: Not on file  . Number of children: 2  . Years of education: Not on file  . Highest education level: Not on file  Occupational History    Employer: Retired  Tobacco Use  . Smoking status: Former  Smoker    Packs/day: 2.00    Years: 40.00    Pack years: 80.00    Types: Cigarettes    Quit date: 02/15/2005    Years since quitting: 14.6  . Smokeless tobacco: Never Used  Substance and Sexual Activity  . Alcohol use: Yes    Alcohol/week: 0.0 standard drinks    Comment: Occasional  . Drug use: No  . Sexual activity: Yes    Partners: Female  Other Topics Concern  . Not on file  Social History Narrative   Exercise--  Walks dogs everyday      LIves with wife , no stairs in home, caffeine - one cup coffee day, exercise - not much, Right handed, 12th grade, retired      One story home   Social Determinants of Health   Financial Resource Strain:   . Difficulty of Paying Living Expenses:   Food Insecurity:   . Worried About Charity fundraiser in the Last Year:   . Arboriculturist in the Last Year:   Transportation Needs:   . Film/video editor (Medical):   [name] Lack of Transportation (Non-Medical):   Physical Activity:   . Days of Exercise per Week:   . Minutes of Exercise per Session:   Stress:   . Feeling of Stress :   Social Connections:   . Frequency of Communication with Friends and Family:   . Frequency of Social Gatherings with Friends and Family:   . Attends Religious Services:   . Active Member of Clubs or Organizations:   . Attends Archivist Meetings:   [name] Marital Status:   Intimate Partner Violence:   . Fear of Current or Ex-Partner:   . Emotionally Abused:   [name] Physically Abused:   . Sexually Abused:     PHYSICAL EXAM: General: No acute distress.  Patient appears well-groomed.   Head:   Normocephalic/atraumatic Eyes:  Fundi examined but not visualized Neck: supple, no paraspinal tenderness, full range of motion Heart:  Regular rate and rhythm Lungs:  Clear to auscultation bilaterally Back: No paraspinal tenderness Neurological Exam: alert and oriented to person, place, and time. Attention span and concentration intact, recent and remote memory intact, fund of knowledge intact.  Speech fluent and not dysarthric, language intact.  CN II-XII intact. Bulk and tone normal, muscle strength 5/5 throughout.  Sensation to light touch, temperature and vibration intact.  Deep tendon reflexes 2+ throughout, toes downgoing.  Finger to nose and heel to shin testing intact.  Gait normal, Romberg negative.  IMPRESSION: 1.  Dementia, with progression.  2.  Unsteady gait with back pain and leg pain and weakness.  He does have history of lumbar spondylosis.  Will check MRI of lumbar spine   PLAN: 1.  MRI of lumbar spine 2.  Neuropsychological testing 3.  Follow up after testing  Metta Clines, DO  CC: Roma Schanz, DO

## 2019-10-01 ENCOUNTER — Ambulatory Visit: Payer: PPO | Admitting: Neurology

## 2019-10-01 ENCOUNTER — Other Ambulatory Visit: Payer: Self-pay

## 2019-10-01 DIAGNOSIS — Z8673 Personal history of transient ischemic attack (TIA), and cerebral infarction without residual deficits: Secondary | ICD-10-CM | POA: Diagnosis not present

## 2019-10-01 DIAGNOSIS — M79604 Pain in right leg: Secondary | ICD-10-CM | POA: Diagnosis not present

## 2019-10-01 DIAGNOSIS — R29898 Other symptoms and signs involving the musculoskeletal system: Secondary | ICD-10-CM

## 2019-10-01 DIAGNOSIS — F0391 Unspecified dementia with behavioral disturbance: Secondary | ICD-10-CM

## 2019-10-01 DIAGNOSIS — M79605 Pain in left leg: Secondary | ICD-10-CM | POA: Diagnosis not present

## 2019-10-01 NOTE — Patient Instructions (Addendum)
1.  We will check MRI of lumbar spine without contrast. We have sent a referral to [facility] for your MRI and they will call you directly to schedule your appointment. They are located at [place]. If you need to contact them directly please call [phone].  2.  We will order neurocognitive testing 3.  Follow up after testing.

## 2019-10-09 MED FILL — EZETIMIBE 10 MG TABS: 10 | 90 days supply | Qty: 90 | Fill #2

## 2019-10-09 MED FILL — LISINOPRIL-HCTZ 10-12.5 MG: 10-12.5 | 30 days supply | Qty: 30 | Fill #0

## 2019-10-17 ENCOUNTER — Telehealth: Payer: Self-pay | Admitting: Neurology

## 2019-10-17 NOTE — Telephone Encounter (Signed)
Pt still has not heard anything for them to sch the MRI please call and let them know the status

## 2019-10-18 MED FILL — LORazepam 0.5 MG TABS: 0.5 | 30 days supply | Qty: 150 | Fill #0

## 2019-10-18 MED FILL — TAMSULOSIN HCL 0.4 MG CAP: 0.4 | 90 days supply | Qty: 90 | Fill #3

## 2019-10-18 NOTE — Telephone Encounter (Signed)
Spoke to the Wife, [facility] number to call and schedule the MRI. Order added at the time of pt visit. 10/01/2019

## 2019-10-22 ENCOUNTER — Ambulatory Visit: Payer: PPO | Admitting: Podiatry

## 2019-11-07 ENCOUNTER — Ambulatory Visit (HOSPITAL_COMMUNITY): Payer: PPO | Admitting: Psychiatry

## 2019-11-14 ENCOUNTER — Other Ambulatory Visit: Payer: Self-pay

## 2019-11-14 ENCOUNTER — Ambulatory Visit
Admission: RE | Admit: 2019-11-14 | Discharge: 2019-11-14 | Disposition: A | Discharge status: Home / Self Care | Payer: PPO | Source: Ambulatory Visit | Attending: Neurology | Admitting: Neurology

## 2019-11-14 DIAGNOSIS — M48061 Spinal stenosis, lumbar region without neurogenic claudication: Secondary | ICD-10-CM | POA: Diagnosis not present

## 2019-11-14 DIAGNOSIS — M79604 Pain in right leg: Secondary | ICD-10-CM

## 2019-11-14 DIAGNOSIS — M79605 Pain in left leg: Secondary | ICD-10-CM

## 2019-11-14 DIAGNOSIS — R29898 Other symptoms and signs involving the musculoskeletal system: Secondary | ICD-10-CM

## 2019-11-14 MED FILL — LISINOPRIL-HCTZ 10-12.5 MG: 10-12.5 | 30 days supply | Qty: 30 | Fill #1

## 2019-11-14 MED FILL — AMLODIPINE BESYLATE 5 MG TA: 5 | 90 days supply | Qty: 90 | Fill #1

## 2019-11-16 ENCOUNTER — Other Ambulatory Visit: Payer: Self-pay

## 2019-11-16 ENCOUNTER — Encounter: Payer: PPO | Admitting: Psychology

## 2019-11-16 DIAGNOSIS — M545 Low back pain, unspecified: Secondary | ICD-10-CM

## 2019-11-20 ENCOUNTER — Encounter: Payer: Self-pay | Admitting: Podiatry

## 2019-11-20 ENCOUNTER — Ambulatory Visit: Payer: PPO | Admitting: Podiatry

## 2019-11-20 ENCOUNTER — Other Ambulatory Visit: Payer: Self-pay

## 2019-11-20 DIAGNOSIS — B351 Tinea unguium: Secondary | ICD-10-CM | POA: Diagnosis not present

## 2019-11-20 DIAGNOSIS — M79676 Pain in unspecified toe(s): Secondary | ICD-10-CM | POA: Diagnosis not present

## 2019-11-25 NOTE — Progress Notes (Signed)
Subjective: 79 y.o. returns the office today for painful, elongated, thickened toenails which he cannot trim himself. Hhe states that the left big toenail is currently not causing any discomfort but concern about the way it looks.  Currently denies any drainage or pus to the toenail sites.  Denies any open lesions.  He has no other concerns today. Denies any systemic complaints such as fevers, chills, nausea, vomiting.   PCP: Carollee Herter, Alferd Apa, DO  Objective: NAD, presents with his wife DP/PT pulses palpable, CRT less than 3 seconds  Nails hypertrophic, dystrophic, elongated, brittle, discolored x 10. There is tenderness overlying the nails 1-5 bilaterally.  There is still some scab present on the left hallux nail borders.  There is no drainage or pus or any clinical signs of infection.  Left hallux toenail is clear at the base of the nail distally to be dystrophic. There is no surrounding erythema or drainage along the nail sites. No open lesions or pre-ulcerative lesions are identified. No pain with calf compression, swelling, warmth, erythema.  Assessment: Patient presents with symptomatic onychomycosis  Plan: -Treatment options including alternatives, risks, complications were discussed -Nails debrided x 10 without any complications or bleeding  Return in about 3 months (around 02/20/2020).   Celesta Gentile, DPM

## 2019-11-28 ENCOUNTER — Encounter: Payer: PPO | Admitting: Psychology

## 2019-11-28 MED FILL — ESZOPICLONE 3 MG TABS: 3 | 90 days supply | Qty: 90 | Fill #0

## 2019-11-29 ENCOUNTER — Other Ambulatory Visit: Payer: Self-pay

## 2019-11-29 ENCOUNTER — Encounter: Payer: Self-pay | Admitting: Psychology

## 2019-11-29 ENCOUNTER — Ambulatory Visit: Payer: PPO | Admitting: Psychology

## 2019-11-29 ENCOUNTER — Ambulatory Visit (INDEPENDENT_AMBULATORY_CARE_PROVIDER_SITE_OTHER): Payer: PPO | Admitting: Psychology

## 2019-11-29 DIAGNOSIS — F0390 Unspecified dementia without behavioral disturbance: Secondary | ICD-10-CM

## 2019-11-29 DIAGNOSIS — I63511 Cerebral infarction due to unspecified occlusion or stenosis of right middle cerebral artery: Secondary | ICD-10-CM | POA: Diagnosis not present

## 2019-11-29 DIAGNOSIS — F0150 Vascular dementia without behavioral disturbance: Secondary | ICD-10-CM

## 2019-11-29 DIAGNOSIS — R4189 Other symptoms and signs involving cognitive functions and awareness: Secondary | ICD-10-CM

## 2019-11-29 NOTE — Progress Notes (Addendum)
NEUROPSYCHOLOGICAL EVALUATION [place]. [place] Department of Neurology  Date of Evaluation: November 29, 2019  Reason for Referral:   William [name] is a 79 y.o. right-handed Caucasian male referred by Metta Clines, D.O., to characterize his current cognitive functioning and assist with diagnostic clarity and treatment planning in the context of a prior right MCA stroke and concerns surrounding progressive cognitive decline.   Assessment and Plan:   Clinical Impression(s): William [name] pattern of performance is suggestive of prominent impairments in visuospatial abilities and executive functioning. An additional weakness was noted across semantic fluency while performance variability was further noted across processing speed, complex attention, and encoding (i.e., learning) and retrieval aspects of both verbal and visual memory; however, visual memory represented a noted weakness relative to verbal scores. Performance was appropriate across basic attention, receptive language, phonemic fluency, and confrontation naming. Relative to his previous evaluation in May 2015, current scores exhibited a good amount of stability. Performance declines generally surrounded encoding/retrieval aspects of a verbal list learning task, mental arithmetic, and across his copy of a complex figure. William [name] and his wife reported difficulties completing instrumental activities of daily living (ADLs), particularly surrounding trouble with medication and financial management, as well as the cessation of driving. 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 However, I would continue to classify him towards the more mild end of this spectrum.  Regarding etiology, results of cognitive testing continue to be consistent with William [name] history  of a very large/severe right MCA stroke. Profound deficits with visuospatial abilities and executive functioning would be expected given the location and size of this event. Deficits in processing speed, attention/concentration, and encoding/retrieval aspects of memory (particularly visual) would also be expected to varying degrees. 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 While I believe a vascular etiology represents the primary cause of ongoing deficits, I cannot entirely rule out the presence of an underlying neurodegenerative condition. Neuroimaging did suggest some progression between 2015 and 2021 scans by my review. However, William [name] and his wife did not report significant cognitive or functional decline in his day-to-day life and performances across cognitive testing were largely stable with a few exceptions. These factors make an underlying neurodegenerative condition (such as posterior cortical atrophy or corticobasal degeneration) less likely. Behavioral characteristics were not concerning for Lewy body dementia or frontotemporal dementia and no significant mood-related concerns were reported. Continued medical monitoring will be important moving forward.  Recommendations: A repeat neuropsychological evaluation in 24 months (or sooner if functional decline is noted) is recommended to assess the trajectory of future cognitive decline should it occur. This will also aid in future efforts towards improved diagnostic clarity.  If diagnostic clarity is a primary goal of William [name] and his wife, they could discuss the pros and cons of a PET scan with Dr. Tomi Likens. While this could yield vital information towards arriving at improved diagnostic accuracy, these results likely would not drastically change ongoing treatment efforts.   Given profound deficits with visuospatial abilities and executive functioning, I strongly encourage William [name] to continue refraining from driving.    William [name] is encouraged to attend to lifestyle factors for brain health (e.g., regular physical exercise, good nutrition habits, regular participation in cognitively-stimulating activities, and general stress management techniques), which are likely to have benefits for both emotional adjustment and  cognition. In fact, in addition to promoting good general health, regular exercise incorporating aerobic activities (e.g., brisk walking, jogging, cycling, etc.) has been demonstrated to be a very effective treatment for depression and stress, with similar efficacy rates to both antidepressant medication and psychotherapy. Optimal control of vascular risk factors (including safe cardiovascular exercise and adherence to dietary recommendations) is encouraged.  Should there be a progression of his current deficits over time, William [name] is unlikely to regain any independent living skills lost. Therefore, it is recommended that he remain as involved as possible in all aspects of household chores, finances, and medication management, with supervision to ensure adequate performance. He will likely benefit from the establishment and maintenance of a routine in order to maximize his functional abilities over time.  If interested, there are some activities which have therapeutic value and can be useful in keeping him cognitively stimulated. For suggestions, William [name] is encouraged to go to the following website: https://www.[facility].org/get-to-know-barrow/centers-programs/neurorehabilitation-center/neuro-rehab-apps-and-games/ which has options, categorized by level of difficulty. It should be noted that these activities should not be viewed as a substitute for therapy.  When learning new information, he would benefit from information being broken up into small, manageable pieces. He may also find it helpful to articulate the material in his own words and in a context to promote encoding at the onset of a new task.  This material may need to be repeated multiple times to promote encoding.  Memory can be improved using internal strategies such as rehearsal, repetition, chunking, mnemonics, association, and imagery. External strategies such as written notes in a consistently used memory journal, visual and nonverbal auditory cues such as a calendar on the refrigerator or appointments with alarm, such as on a cell phone, can also help maximize recall.    Given his greater difficulty learning and recalling visual information, he might benefit from verbalizing information to be retained. Additionally, when placed into a contextual organizational structure, his memory was improved. This suggests that when presenting him with information, it is valuable to place it in a meaningful context.  To address problems with processing speed, he may wish to consider:   -Ensuring that he is alerted when essential material or instructions are being presented   -Adjusting the speed at which new information is presented   -Allowing for more time in comprehending, processing, and responding in conversation  To address problems with executive dysfunction, he may wish to consider:   -Avoiding external distractions when needing to concentrate   -Limiting exposure to fast paced environments with multiple sensory demands   -Writing down complicated information and using checklists   -Attempting and completing one task at a time (i.e., no multi-tasking)   -Verbalizing aloud each step of a task to maintain focus   -Reducing the amount of information considered at one time  Review of Records:   William [name] completed a comprehensive neuropsychological evaluation Macarthur Critchley, Psy.D.) on 10/25/2013. Testing revealed prominent deficits in visual processing (i.e., visual scanning, visuospatial perception, and visuoconstructional abilities), suggesting occipital and right parietal lobe involvement. There was also evidence for some executive  dysfunction affecting response inhibition, cognitive flexibility, and problem solving. Memory and naming abilities were described as intact. 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   William [name] was seen by [name] (Adam  Tomi Likens, D.O.) on 10/01/2019 for follow-up. Briefly, he had a stroke in 2006 after experiencing left facial droop, left arm and leg weakness, as well slurred speech and vision problems. 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 He continued to have residual left sided and facial weakness. He underwent left carotid endarterectomy in 2010 for asymptomatic stenosis. Beginning around 2013, he and his wife have noted a gradual progression of cognitive deficits. Onset correlated during a period when he was experiencing dizziness and syncope secondary to orthostatic hypotension. At that time, he was dehydrated and not drinking much fluids. He particularly noted confusion regarding how to perform everyday tasks. For example, it would take him a long time to unload the dishwasher because he had trouble figuring out where to put the dishes. He used to enjoy cooking and preparing meals; however, now he has difficulty cooking and can only heat things up in the microwave. He has also been experiencing dressing apraxia. Sometimes he will put both his feet into one leg of his pants. Other times, he has difficulty buttoning his shirts. He denied trouble with receptive of expressive language. He has no trouble reading or writing. His short-term and long-term memory were described as good. He denied difficulty with face recognition. He denied hallucinations or delusions, as well as changes in his  personality or behavior. More recently, he developed increased confusion this past February. At physical therapy, he was noted to have increased difficulty using his left side (affected side from his stroke). Specifically, he had difficulty performing finger-thumb tapping and had trouble gripping the handle of the exercise machine with his left hand. He also had acute cognitive changes (e.g., being unable to dial his daughter's phone number). Ultimately, William [name] was referred for a repeat neuropsychological evaluation to characterize his cognitive abilities and to assist with diagnostic clarity and treatment planning.   Brain MRI on 08/15/2019 was negative for any acute intracranial abnormalities. It did show a large area of chronic encephalomalacia in the right hemisphere primarily affecting the MCA territory. Mild associated hemosiderin was noted. Superimposed patchy left cerebral white matter hyperintensities were said to be stable. A chronic lacunar infarct in the left caudate was also said to be stable. Overall, findings were said to be stable relative to a prior MRI performed on 10/19/2013.   Past Medical History:  Diagnosis Date  . Arthritis    low back  . 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 Bladder stone   . BPH (benign prostatic hyperplasia) 08/06/2007  . Carotid artery occlusion   . 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 Closed fracture of fifth metacarpal bone 05/15/2015  . Eczema   . Fasting hyperglycemia 12/21/2006  . GERD (gastroesophageal reflux disease)   . History of carotid artery stenosis    S/P BILATERAL CEA  . 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 HTN (hypertension) 07/19/2015  . Hyperlipidemia   . Hypertension   . 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 Nocturia   . Renal insufficiency 06/25/2013  . S/P carotid endarterectomy    BILATERAL ICA--  PATENT PER DUPLEX  05-19-2012  . 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 Urinary frequency   . Vitamin D deficiency     Past Surgical History:  Procedure Laterality Date  . APPENDECTOMY  AS CHILD  . CARDIOVASCULAR STRESS TEST  03-27-2012  DR CRENSHAW   LOW RISK LEXISCAN STUDY-- PROBABLE NORMAL PERFUSION AND SOFT TISSUE ATTENUATION/  NO ISCHEMIA/ EF 51%  . CAROTID ENDARTERECTOMY Bilateral LEFT  11-12-2008  DR GREG HAYES   RIGHT ICA  2006  ([facility])  . CYSTOSCOPY WITH LITHOLAPAXY N/A 02/26/2013   Procedure: CYSTOSCOPY WITH LITHOLAPAXY;  Surgeon: Franchot Gallo, MD;  Location: [facility];  Service: Urology;  Laterality: N/A;  . EYE SURGERY  Jan. 2016   cataract surgery both eyes  . INGUINAL HERNIA REPAIR Right 11-08-2006  . MASS EXCISION N/A 03/03/2016   Procedure: EXCISION OF BACK  MASS;  Surgeon: Stark Klein, MD;  Location: [facility];  Service: General;  Laterality: N/A;  . 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 PROSTATE SURGERY    . TRANSURETHRAL RESECTION OF PROSTATE N/A 02/26/2013   Procedure: TRANSURETHRAL RESECTION OF THE PROSTATE WITH GYRUS INSTRUMENTS;  Surgeon: Franchot Gallo, MD;  Location: [facility];  Service: Urology;  Laterality: N/A;    Current Outpatient Medications:  .  acetaminophen (TYLENOL) 500 MG tablet, Take 500 mg by mouth every 6 (six) hours as needed (1 tab as needed)., Disp: , Rfl:  .  amLODipine (NORVASC) 5 MG tablet, TAKE 1 TABLET (5 MG TOTAL) BY MOUTH DAILY., Disp: 90 tablet, Rfl: 1 .  aspirin 325 MG EC tablet, Take 325 mg by mouth daily., Disp: , Rfl:  .  carbamazepine (TEGRETOL-XR) 100 MG 12 hr tablet, 1 qam  1  qhs, Disp: 180 tablet, Rfl: 2 .  Cholecalciferol (VITAMIN D) 2000 UNITS CAPS, Take by mouth., Disp: , Rfl:  .  docusate sodium (COLACE) 100 MG capsule, Take 100 mg by mouth daily., Disp: , Rfl:  .  Eszopiclone 3 MG TABS, TAKE 1 TABLET BY MOUTH AT BEDTIME AS NEEDED TAKE  IMMEDIATELY BEFORE BEDTIME, Disp: 90 tablet, Rfl: 2 .  ezetimibe (ZETIA) 10 MG tablet, TAKE 1 TABLET (10 MG TOTAL) BY MOUTH DAILY., Disp: 90 tablet, Rfl: 3 .  famotidine (PEPCID) 20 MG tablet, Take 20 mg by mouth 2 (two) times daily., Disp: , Rfl:  .  fenofibrate 160 MG tablet, TAKE 1 TABLET BY MOUTH DAILY. REPEAT LABS ARE DUE NOW, Disp: 90 tablet, Rfl: 1 .  Flaxseed, Linseed, (FLAX SEED OIL PO), Take by mouth daily.  , Disp: , Rfl:  .  fluticasone (CUTIVATE) 0.05 % cream, , Disp: , Rfl: 3 .  lisinopril-hydrochlorothiazide (ZESTORETIC) 10-12.5 MG tablet, Take 1 tablet by mouth daily., Disp: , Rfl:  .  LORazepam (ATIVAN) 0.5 MG tablet, 2  Bid  1 as needed, Disp: 150 tablet, Rfl: 5 .  Multiple Vitamin (MULTIVITAMIN) tablet, Take 1 tablet by mouth daily., Disp: , Rfl:  .  NONFORMULARY OR COMPOUNDED ITEM, [facility]:  Onychomycosis Nail lacquer - Fluconazole 2%, Terbinafine 1%, DMSO, apply to affected area daily. (Patient not taking: Reported on 10/01/2019), Disp: 120 each, Rfl: 2 .  NONFORMULARY OR COMPOUNDED ITEM, [facility]:  Antifungal topical - Terbinafine 3%, Fluconazole 2%, Tea Tree Oil 5%, Urea 10/%,, Ibuprofen 2% in DMSO Suspension #68ml. Apply to the affected nail(s) once (at bedtime) or twice daily. (Patient not taking: Reported on 10/01/2019), Disp: 100 each, Rfl: 2 .  NONFORMULARY OR COMPOUNDED ITEM, Compression socks  #1  Dx low ext edema and ache (Patient not taking: Reported on 10/01/2019), Disp: 1 each, Rfl: 0 .  ondansetron (ZOFRAN ODT) 4 MG disintegrating  tablet, Take 1 tablet (4 mg total) by mouth every 8 (eight) hours as needed for nausea or vomiting., Disp: 20 tablet, Rfl: 0 .  tamsulosin (FLOMAX) 0.4 MG CAPS capsule, Take 1 capsule (0.4 mg total) by mouth daily., Disp: 90 capsule, Rfl: 3 .  traMADol (ULTRAM) 50 MG tablet, Take 1 tablet (50 mg total) by mouth every 8 (eight) hours as needed., Disp: 30 tablet, Rfl: 0  Clinical Interview:   Cognitive  Symptoms: Decreased short-term memory: Denied. Decreased long-term memory: Denied. Decreased attention/concentration: Denied. Reduced processing speed: Denied. His wife reported her belief that processing speed seems to have diminished a mild amount.  Difficulties with executive functions: Denied. Trouble with impulsivity was also denied.  Difficulties with emotion regulation: Endorsed. He acknowledged some trouble with irritability. His wife agreed, describing Mr. Darling as having a much shorter fuse and will become very angry quite quickly relative to his pre-stroke baseline. Social disinhibition was denied.  Difficulties with receptive language: Denied. Difficulties with word finding: Denied. Decreased visuoperceptual ability: Denied.  Trajectory of deficits: Cognitive deficits were said to be largely stable, dating back at least to 2013. Deficits likely date back to 2006 following his right MCA stroke. Deficits before this time were denied.   Difficulties completing ADLs: Endorsed. His wife manages and organizes his medications and their finances. She noted that he will occasionally have trouble taking the correct pills after she has organized them in his pillbox (e.g., takes the evening dosages by mistake). He has stopped driving due to limitations stemming from his stroke.   Additional Medical History: History of traumatic brain injury/concussion: Denied. History of stroke: Endorsed (see above). His wife also reported a potential TIA event where he briefly experienced fine motor deficits and increased left-sided weakness a few months prior. This could have coincided with reports of confusion in his medical in February.  History of seizure activity: Denied. History of known exposure to toxins: Denied. Symptoms of chronic pain: Endorsed. He acknowledged some bilateral leg pain, as well as experiences of weakness stemming from his stroke.  Experience of frequent headaches/migraines:  Denied. Frequent instances of dizziness/vertigo: Denied. He did acknowledge symptoms of orthostatic hypotension where he will briefly feel dizzy when standing quickly.   Sensory changes: He wears glasses (2.5 correction based on drug store reading glasses) with positive effect. He denied visual acuity concerns when wearing these glasses. Other sensory changes/difficulties (i.e., hearing, taste, or smell) were denied.  Balance/coordination difficulties: Denied. He denied a recent history of falls. His wife reported that he has fallen twice since the start of the year, both without significant physical injury.  Other motor difficulties: Denied.  Sleep History: Estimated hours obtained each night: 8+ hours. Difficulties falling asleep: Denied. He reported using medications to help him fall asleep with success. Difficulties staying asleep: Endorsed. He described his sleep as "interrupted," noting that he frequently wakes up to use the restroom throughout the night.  Feels rested and refreshed upon awakening: Endorsed.  History of snoring: His wife reported mild snoring behaviors.  History of waking up gasping for air: Denied. Witnessed breath cessation while asleep: Denied.  History of vivid dreaming: Denied. Excessive movement while asleep: Denied. Instances of acting out his dreams: Denied.  Psychiatric/Behavioral Health History: Depression: Denied. He described his mood as fine and denied being previously diagnosed with any mental health conditions. His wife expressed some concern surrounding a mild experience of depression currently ongoing. However, no examples were provided. He does currently see a psychiatrist and has  been prescribed mood-related medications. These were said to be effective at managing symptoms, especially irritability. Current or remote suicidal ideation, intent, or plan was denied.  Anxiety: Denied. However, he did describe acute symptoms of significant anxiety due to  impending testing procedures.  Mania: Denied. Trauma History: Denied. Visual/auditory hallucinations: Denied. Delusional thoughts: Denied.  Tobacco: Denied. Smoking was thought to be a contributory factor into his 2006 stroke. He quit shortly afterwards.  Alcohol: He reported drinking socially and denied a history of problematic alcohol abuse or dependence.  Recreational drugs: Denied. Caffeine: 2 cups of coffee in the morning, as well as an occasional soda.   Family History: Problem Relation Age of Onset  . Heart disease Mother        CHF  . Bipolar disorder Mother   . Heart disease Father        CHF   This information was confirmed by Mr. Jansma.  Academic/Vocational History: Highest level of educational attainment: 12 years. He graduated from high school and described himself as a "mediocre" (B/C) student in academic settings. Math was noted as a relative weakness.  History of developmental delay: Denied. History of grade repetition: Denied. Enrollment in special education courses: Denied. History of LD/ADHD: Denied.  Employment: Retired. 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. He has been unable to work since his stroke due to cognitive and physical limitations.   Evaluation Results:   Behavioral Observations: William [name] was accompanied by his wife, arrived to his appointment on time, and was appropriately dressed and groomed. He appeared alert and oriented. He ambulated with a noted limp, suggesting left-sided lower extremity weakness/dysfunction. However, he ambulated at an appropriate pace and did not require external supports. Gross motor functioning appeared intact upon informal observation and no abnormal movements (e.g., tremors) were noted. His affect was generally relaxed and positive. However, he and his wife did note that he has become more emotional lately and is easily brought to tears. This was witnessed as he was  acknowledging significant anxiety surrounding the upcoming testing procedures. He also expressed significant anxiety surrounding being diagnosed with dementia (despite this previously occurring in 2015). Spontaneous speech was fluent and word finding difficulties were not observed during the clinical interview. Thought processes were coherent, organized, and normal in content. Insight into his cognitive difficulties was difficult to discern. It was either quite poor given that he denied all subjective cognitive dysfunction despite prior and current scores suggesting objective difficulties, or he was actively minimizing these symptoms due to concerns surrounding being diagnosed with dementia. During testing, sustained attention was appropriate. Task engagement was adequate and he persisted when challenged. There was the presence of some borderline inappropriate comments surrounding the psychometrist's appearance and his perception that she appears too young to have had children. The WCST task was not completed due to increasing levels of fatigue towards the end of the evaluation. Overall, Mr. Pendry was cooperative with the clinical interview and subsequent testing procedures.   Adequacy of Effort: The validity of neuropsychological testing is limited by the extent to which the individual being tested may be assumed to have exerted adequate effort during testing. Mr. Flanagan expressed his intention to perform to the best of his abilities and exhibited adequate task engagement and persistence. Scores across stand-alone and embedded performance validity measures were variable but generally  within expectation. One below expectation score was believed to be due to visuospatial deficits rather than poor engagement. As such,  the results of the current evaluation are believed to be a valid representation of Mr. Meneely current cognitive functioning.  Test Results: William [name] was generally oriented at the time of the  current evaluation. Points were lost for him stating an incorrect current location.  Intellectual abilities based upon educational and vocational attainment were estimated to be in the average range. Premorbid abilities were estimated to be within the average range based upon a single-word reading test.   Processing speed was exceptionally low to below average. Basic attention was average. More complex attention (e.g., working memory) was well below average to average. Executive functioning was exceptionally low.  Assessed receptive language abilities were above average. Assessed expressive language was variable. Phonemic fluency was average, semantic fluency was well below average, and confrontation naming was above average.     Assessed visuospatial/visuoconstructional abilities were exceptionally low. Points were lost on his drawing of a clock due to nearly all the numbers being placed in the right half, as well as no size differentiation between clock hands. Points were lost on his copy of a complex figure were due to significant visual distortions and poor planning.    Learning (i.e., encoding) of novel verbal and visual information was well below average to average. Spontaneous delayed recall (i.e., retrieval) of previously learned information was also well below average to average. Retention rates were 89% across a story learning task, 57% across a list learning task, and 80% across a shape learning task. Performance across recognition tasks was appropriate across verbal measures but below expectation across visual measures, suggesting some evidence for information consolidation.   Results of emotional screening instruments suggested that recent symptoms of generalized anxiety were in the minimal range, while symptoms of depression were within normal limits. A screening instrument assessing recent sleep quality suggested the presence of minimal sleep dysfunction.  Tables of Scores:   Note:  This summary of test scores accompanies the interpretive report and should not be considered in isolation without reference to the appropriate sections in the text. Descriptors are based on appropriate normative data and may be adjusted based on clinical judgment. The terms "impaired" and "within normal limits (WNL)" are used when a more specific level of functioning cannot be determined. Descriptors refer to the current evaluation only.         Effort Testing:    DESCRIPTOR   May 2015 Current    Dot Counting Test: --- --- --- Below Expectation  WAIS-IV Reliable Digit Span: --- --- --- Within Expectation  HVLT-R Recognition Discrimination Index: --- --- --- Within Expectation  BVMT-R Retention Percentage: --- --- --- Within Expectation        Orientation:       Raw Score Raw Score Percentile   NAB Orientation, Form 1 --- 28/29 --- ---        Intellectual Functioning:             Standard Score Standard Score Percentile   Test of Premorbid Functioning: --- 103 58 Average        Memory:            Wechsler Memory Scale (WMS-IV):                       Raw Score Raw Score (Scaled Score) Percentile     Logical Memory I --- 26/53 (9) 37 Average    Logical Memory II --- 17/39 (10) 50 Average    Logical Memory Recognition --- 21/23 >75 Above Average  Hopkins Verbal Learning Test (HVLT-R), Form 1: Raw Score Raw Score (T Score) Percentile     Total Trials 1-3 --- 17/36 (38) 12 Below Average    Delayed Recall --- 4/12 (34) 5 Well Below Average    Recognition Discrimination Index --- 9 (43) 25 Average      True Positives --- 10 --- ---      False Positives --- 1 --- ---         Brief Visuospatial Memory Test (BVMT-R), Form 1: Raw Score Raw Score (T Score) Percentile     Total Trials 1-3 --- 10/36 (32) 4 Well Below Average    Delayed Recall --- 4/12 (35) 7 Well Below Average    Recognition Discrimination Index --- 1 <1 Exceptionally Low      Recognition Hits --- 2/6 3-5 Well Below  Average      False Positive Errors --- 1 11-16 Below Average         Rey-Osterrieth Complex Figure Test (RCFT): Raw Score Raw Score (T Score) Percentile     Immediate Recall 7/36 9/36 (44) 27 Average    Delayed Recall 9/36 5.5/36 (34) 5 Well Below Average    Recognition Total Correct 16/24 16/24 (26) 1 Exceptionally Low      True Positives --- 6 2-5 Well Below Average      False Positives --- 2 >16 Within Normal Limits        Attention/Executive Function:            Trail Making Test (TMT): Raw Score Raw Score (T Score) Percentile     Part A 100 secs.,  0 errors 89 secs.,  0 errors (25) 1 Exceptionally Low    Part B 292 secs.,  2 errors Discontinued --- Impaired          Scaled Score Scaled Score Percentile   WAIS-IV Symbol Search: 4 6 9  Below Average  WAIS-IV Coding: 5 6 9  Below Average         Scaled Score Scaled Score Percentile   WAIS-IV Arithmetic: 12 8 25  Average  WAIS-IV Digit Span: 8 8 25  Average    Forward 9 10 50 Average    Backward 8 9 37 Average    Sequencing 8 4 2  Well Below Average        D-KEFS Color-Word Interference Test: Raw Score Raw Score (Scaled Score) Percentile     Color Naming 45 secs. 48 secs. (4) 2 Well Below Average    Word Reading 27 secs. 35 secs. (5) 5 Well Below Average    Inhibition 115 secs. 125 secs. (2) <1 Exceptionally Low      Total Errors 10 errors 12 errors (1) <1 Exceptionally Low    Inhibition/Switching Discontinued 124 secs. (4) 2 Well Below Average      Total Errors --- 15 errors (1) <1 Exceptionally Low        Language:            Verbal Fluency Test: Raw Score Raw Score (T Score) Percentile     Phonemic Fluency (FAS) 34 39 (53) 62 Average    Animal Fluency 9 11 (35) 7 Well Below Average         NAB Language Module, Form 1: T Score T Score Percentile     Auditory Comprehension --- 58 79 Above Average    Naming --- 31/31 (60) 84 Above Average        Visuospatial/Visuoconstruction:       Raw Score Raw Score  Percentile    Clock Drawing: --- 5/10 --- Impaired  BVMT-R, Copy: 11/12 10/12 --- Within Expectation  RCFT, Copy: 19.5 9.5/36 <1 Exceptionally Low        NAB Spatial Module, Form 1: T Score T Score Percentile     Visual Discrimination --- 24 <1 Exceptionally Low         Scaled Score Scaled Score Percentile   WAIS-IV Block Design: 4 2 <1 Exceptionally Low        Mood and Personality:       Raw Score Raw Score Percentile   Geriatric Depression Scale: --- 3 --- Within Normal Limits  Geriatric Anxiety Scale: --- 7 --- Minimal    Somatic --- 3 --- Minimal    Cognitive --- 0 --- Minimal    Affective --- 4 --- Mild        Additional Questionnaires:       Raw Score Raw Score Percentile   PROMIS Sleep Disturbance Questionnaire: --- 11 --- None to Slight   Informed Consent and Coding/Compliance:   Mr. Ketcher was provided with a verbal description of the nature and purpose of the present neuropsychological evaluation. Also reviewed were the foreseeable risks and/or discomforts and benefits of the procedure, limits of confidentiality, and mandatory reporting requirements of this provider. The patient was given the opportunity to ask questions and receive answers about the evaluation. Oral consent to participate was provided by the patient.   This evaluation was conducted by Christia Reading, Ph.D., licensed clinical neuropsychologist. Mr. Scarber completed a comprehensive clinical interview with Dr. Melvyn Novas, billed as one unit [phone], and 135 minutes of cognitive testing and scoring, billed as one unit [phone] and four additional units [id number]. Psychometrist Milana Kidney, B.S., assisted Dr. Melvyn Novas with test administration and scoring procedures. As a separate and discrete service, Dr. Melvyn Novas spent a total of 180 minutes in interpretation and report writing billed as one unit [phone] and two units [id number].

## 2019-11-29 NOTE — Progress Notes (Signed)
   Psychometrician Note   Cognitive testing was administered to William [name] by Milana Kidney, B.S. (psychometrist) under the supervision of Dr. Christia Reading, Ph.D., licensed psychologist on 11/29/19. William [name] did not appear overtly distressed by the testing session per behavioral observation or responses across self-report questionnaires. Dr. Christia Reading, Ph.D. checked in with William [name] as needed to manage any distress related to testing procedures (if applicable). Rest breaks were offered.    The battery of tests administered was selected by Dr. Christia Reading, Ph.D. with consideration to William [name] current level of functioning, the nature of his symptoms, emotional and behavioral responses during interview, level of literacy, observed level of motivation/effort, and the nature of the referral question. This battery was communicated to the psychometrist. Communication between Dr. Christia Reading, Ph.D. and the psychometrist was ongoing throughout the evaluation and Dr. Christia Reading, Ph.D. was immediately accessible at all times. Dr. Christia Reading, Ph.D. provided supervision to the psychometrist on the date of this service to the extent necessary to assure the quality of all services provided.    William [name] will return within approximately 1-2 weeks for an interactive feedback session with Dr. Melvyn Novas at which time his test performances, clinical impressions, and treatment recommendations will be reviewed in detail. William [name] understands he can contact our office should he require our assistance before this time.  A total of 135 minutes of billable time were spent face-to-face with William [name] by the psychometrist. This includes both test administration and scoring time. Billing for these services is reflected in the clinical report generated by Dr. Christia Reading, Ph.D..  This note reflects time spent with the psychometrician and does not include test scores or any clinical  interpretations made by Dr. Melvyn Novas. The full report will follow in a separate note.

## 2019-11-30 NOTE — Patient Instructions (Signed)
Clinical Impression(s): William [name] pattern of performance is suggestive of prominent impairments in visuospatial abilities and executive functioning. An additional weakness was noted across semantic fluency while performance variability was further noted across processing speed, complex attention, and encoding (i.e., learning) and retrieval aspects of both verbal and visual memory; however, visual memory represented a noted weakness relative to verbal scores. Performance was appropriate across basic attention, receptive language, phonemic fluency, and confrontation naming. Relative to William previous evaluation in May 2015, current scores exhibited a good amount of stability. Performance declines generally surrounding encoding/retrieval aspects of a verbal list learning task, mental arithmetic, and across William copy of a complex figure. William [name] and William [name] reported difficulties completing instrumental activities of daily living (ADLs), particularly surrounding trouble with medication and financial management, as well as the cessation of driving. 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 However, I would continue to classify him towards the more mild end of this spectrum.  Regarding etiology, results of cognitive testing continue to be consistent with William [name] history of a very large/severe right MCA stroke. Profound deficits with visuospatial abilities and executive functioning would be expected given the location and size of this event. Deficits in processing speed, attention/concentration, and encoding/retrieval aspects of memory (particularly visual) would also be expected to varying degrees. 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 While I believe a vascular etiology represents the primary cause of ongoing  deficits, I cannot entirely rule out the presence of an underlying neurodegenerative condition. Neuroimaging did suggest some progression between 2015 and 2021 scans by my review. However, William [name] and William [name] did not report significant cognitive or functional decline in William day-to-day life and performances across cognitive testing were largely stable with a few exceptions. These factors make an underlying neurodegenerative condition (such as posterior cortical atrophy or corticobasal degeneration) less likely. Behavioral characteristics were not concerning for Lewy body dementia or frontotemporal dementia and no significant mood-related concerns were reported. Continued medical monitoring will be important moving forward.

## 2019-12-04 ENCOUNTER — Telehealth (INDEPENDENT_AMBULATORY_CARE_PROVIDER_SITE_OTHER): Payer: PPO | Admitting: Psychiatry

## 2019-12-04 ENCOUNTER — Other Ambulatory Visit: Payer: Self-pay

## 2019-12-04 DIAGNOSIS — F0630 Mood disorder due to known physiological condition, unspecified: Secondary | ICD-10-CM

## 2019-12-04 DIAGNOSIS — F4323 Adjustment disorder with mixed anxiety and depressed mood: Secondary | ICD-10-CM | POA: Diagnosis not present

## 2019-12-04 MED ORDER — CARBAMAZEPINE ER 100 MG PO TB12: ORAL_TABLET | ORAL | 2 refills | Status: DC

## 2019-12-04 MED ORDER — LORAZEPAM 0.5 MG PO TABS: ORAL_TABLET | ORAL | 5 refills | Status: DC

## 2019-12-04 NOTE — Progress Notes (Signed)
Patient ID: William [name], male   DOB: 28-Apr-1941, 79 y.o.   MRN: [id number] [facility] MD Progress Note  12/04/2019 2:16 PM William [name]  MRN:  [id number] Subjective:  Feeling good Principal Problem: Mild neurocognitive disorder  Today the patient is seen with his wife William [name].  Patient seems to bit more dysphoric and depressed.  Over the last year or so there is evidence that he seems to be neurologically more impaired.  This was pointed out to him by his physical therapist.  According to his wife he is having more problems with apraxias.  He has some difficulties with the TV remote but handles the microwave fairly well.  The patient feels less confident.  William [name] his wife takes care of all the bills lays out all his medications.  The patient does all his other basic ADLs without a problem.  Generally he continues to sleep and eat fairly well.  He is less physically active and that he no longer can walk significant distances.  He still works out with a Clinical research associate.  Patient has periods where emotionally upset easily very emotional.  He no longer is aggressive or agitated.  He still has times where he is very emotional and in fact gets nauseated.  Once he gets nauseated he often vomits.  Unfortunately he is done this in public settings.  He feels embarrassed about this.  The patient denies persistent daily depression.  He has 4 dogs.  One of his dogs had and is very upset about.  The patient denies being worthless but his self-esteem is clearly reduced.  He is not suicidal.  He shows no evidence of psychosis.  In the next few weeks he is going to see both his neurologist and talk to the person who is going to evaluate his neuropsych testing.   Past Medical History:  Past Medical History:  Diagnosis Date  . Arthritis    low back  . 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 Bladder stone   . BPH (benign prostatic hyperplasia) 08/06/2007  . Carotid artery occlusion   . Chronic kidney  disease 2014   Stage III  . Closed fracture of fifth metacarpal bone 05/15/2015  . Eczema   . Fasting hyperglycemia 12/21/2006  . GERD (gastroesophageal reflux disease)   . History of carotid artery stenosis    S/P BILATERAL CEA  . 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 HTN (hypertension) 07/19/2015  . Hyperlipidemia   . Hypertension   . 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 Nocturia   . Renal insufficiency 06/25/2013  . S/P carotid endarterectomy    BILATERAL ICA--  PATENT PER DUPLEX  05-19-2012  . 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 Urinary frequency   . Vitamin D deficiency     Past Surgical History:  Procedure Laterality Date  . APPENDECTOMY  AS CHILD  . CARDIOVASCULAR STRESS TEST  03-27-2012  DR CRENSHAW   LOW RISK LEXISCAN STUDY-- PROBABLE NORMAL PERFUSION AND SOFT TISSUE ATTENUATION/  NO ISCHEMIA/ EF 51%  . CAROTID ENDARTERECTOMY Bilateral LEFT  11-12-2008  DR GREG HAYES   RIGHT ICA  2006  ([facility])  . CYSTOSCOPY WITH LITHOLAPAXY N/A 02/26/2013   Procedure: CYSTOSCOPY WITH LITHOLAPAXY;  Surgeon: Franchot Gallo, MD;  Location: [facility];  Service: Urology;  Laterality: N/A;  . EYE SURGERY  Jan. 2016   cataract surgery both eyes  . INGUINAL HERNIA REPAIR Right 11-08-2006  . MASS EXCISION N/A 03/03/2016   Procedure: EXCISION OF BACK  MASS;  Surgeon: Stark Klein, MD;  Location: [facility];  Service: General;  Laterality: N/A;  . 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 PROSTATE SURGERY    . TRANSURETHRAL RESECTION OF PROSTATE N/A 02/26/2013   Procedure: TRANSURETHRAL RESECTION OF THE PROSTATE WITH GYRUS INSTRUMENTS;  Surgeon: Franchot Gallo, MD;  Location: [facility];  Service: Urology;  Laterality: N/A;   Family History:  Family History  Problem Relation Age of Onset  . Heart disease Mother        CHF  .  Bipolar disorder Mother   . Heart disease Father        CHF   Family Psychiatric  History:  Social History:  Social History   Substance and Sexual Activity  Alcohol Use Yes  . Alcohol/week: 0.0 standard drinks   Comment: Occasional     Social History   Substance and Sexual Activity  Drug Use No    Social History   Socioeconomic History  . Marital status: Married    Spouse name: Not on file  . Number of children: 2  . Years of education: 70  . Highest education level: High school graduate  Occupational History    Employer: Retired  Tobacco Use  . Smoking status: Former Smoker    Packs/day: 2.00    Years: 40.00    Pack years: 80.00    Types: Cigarettes    Quit date: 02/15/2005    Years since quitting: 14.8  . Smokeless tobacco: Never Used  Vaping Use  . Vaping Use: Never used  Substance and Sexual Activity  . Alcohol use: Yes    Alcohol/week: 0.0 standard drinks    Comment: Occasional  . Drug use: No  . Sexual activity: Yes    Partners: Female  Other Topics Concern  . Not on file  Social History Narrative   Exercise--  Walks dogs everyday      LIves with wife , no stairs in home, caffeine - one cup coffee day, exercise - not much, Right handed, 12th grade, retired      One story home   Social Determinants of Health   Financial Resource Strain:   . Difficulty of Paying Living Expenses:   Food Insecurity:   . Worried About Charity fundraiser in the Last Year:   . Arboriculturist in the Last Year:   Transportation Needs:   . Film/video editor (Medical):   [name] Lack of Transportation (Non-Medical):   Physical Activity:   . Days of Exercise per Week:   . Minutes of Exercise per Session:   Stress:   . Feeling of Stress :   Social Connections:   . Frequency of Communication with Friends and Family:   . Frequency of Social Gatherings with Friends and Family:   . Attends Religious Services:   . Active Member of Clubs or Organizations:   . Attends English as a second language teacher Meetings:   [name] Marital Status:    Additional Social History:                         Sleep: Good  Appetite:  Good  Current Medications: Current Outpatient Medications  Medication Sig Dispense Refill  . acetaminophen (TYLENOL) 500 MG tablet Take  500 mg by mouth every 6 (six) hours as needed (1 tab as needed).    [name] amLODipine (NORVASC) 5 MG tablet TAKE 1 TABLET (5 MG TOTAL) BY MOUTH DAILY. 90 tablet 1  . aspirin 325 MG EC tablet Take 325 mg by mouth daily.    . carbamazepine (TEGRETOL-XR) 100 MG 12 hr tablet 1 qam  1  qhs 180 tablet 2  . Cholecalciferol (VITAMIN D) 2000 UNITS CAPS Take by mouth.    . docusate sodium (COLACE) 100 MG capsule Take 100 mg by mouth daily.    . Eszopiclone 3 MG TABS TAKE 1 TABLET BY MOUTH AT BEDTIME AS NEEDED TAKE IMMEDIATELY BEFORE BEDTIME 90 tablet 2  . ezetimibe (ZETIA) 10 MG tablet TAKE 1 TABLET (10 MG TOTAL) BY MOUTH DAILY. 90 tablet 3  . famotidine (PEPCID) 20 MG tablet Take 20 mg by mouth 2 (two) times daily.    . fenofibrate 160 MG tablet TAKE 1 TABLET BY MOUTH DAILY. REPEAT LABS ARE DUE NOW 90 tablet 1  . Flaxseed, Linseed, (FLAX SEED OIL PO) Take by mouth daily.      . fluticasone (CUTIVATE) 0.05 % cream   3  . lisinopril-hydrochlorothiazide (ZESTORETIC) 10-12.5 MG tablet Take 1 tablet by mouth daily.    [name] LORazepam (ATIVAN) 0.5 MG tablet 2  Bid  1 as needed 150 tablet 5  . Multiple Vitamin (MULTIVITAMIN) tablet Take 1 tablet by mouth daily.    . NONFORMULARY OR COMPOUNDED ITEM [facility]:  Onychomycosis Nail lacquer - Fluconazole 2%, Terbinafine 1%, DMSO, apply to affected area daily. (Patient not taking: Reported on 10/01/2019) 120 each 2  . NONFORMULARY OR COMPOUNDED ITEM [facility]:  Antifungal topical - Terbinafine 3%, Fluconazole 2%, Tea Tree Oil 5%, Urea 10/%,, Ibuprofen 2% in DMSO Suspension #42ml. Apply to the affected nail(s) once (at bedtime) or twice daily. (Patient not taking: Reported on 10/01/2019)  100 each 2  . NONFORMULARY OR COMPOUNDED ITEM Compression socks  #1  Dx low ext edema and ache (Patient not taking: Reported on 10/01/2019) 1 each 0  . ondansetron (ZOFRAN ODT) 4 MG disintegrating tablet Take 1 tablet (4 mg total) by mouth every 8 (eight) hours as needed for nausea or vomiting. 20 tablet 0  . tamsulosin (FLOMAX) 0.4 MG CAPS capsule Take 1 capsule (0.4 mg total) by mouth daily. 90 capsule 3  . traMADol (ULTRAM) 50 MG tablet Take 1 tablet (50 mg total) by mouth every 8 (eight) hours as needed. 30 tablet 0   No current facility-administered medications for this visit.    Lab Results:  No results found for this or any previous visit (from the past 48 hour(s)).  Physical Findings: AIMS:  , ,  ,  ,    CIWA:    COWS:     Musculoskeletal: Strength & Muscle Tone: within normal limits Gait & Station: normal Patient leans: Right  Psychiatric Specialty Exam: ROS  There were no vitals taken for this visit.There is no height or weight on file to calculate BMI.  General Appearance: Casual  Eye Contact::  Good  Speech:  Clear and Coherent  Volume:  Normal  Mood:  Dysphoric and Euthymic  Affect:  Appropriate  Thought Process:  Coherent  Orientation:  Full (Time, Place, and Person)  Thought Content:  WDL  Suicidal Thoughts:  No  Homicidal Thoughts:  No  Memory:  NA  Judgement:  Good  Insight:  Fair  Psychomotor Activity:  Normal  Concentration:  Good  Recall:  Good  Fund of Knowledge:Good  Language: Good  Akathisia:  No  Handed:  Right  AIMS (if indicated):     Assets:  Desire for Improvement  ADL's:  Intact  Cognition: WNL  Sleep:      Treatment Plan Summary: 12/04/2019, 2:16 PM   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  The patient does have weakness in his left hand but has no problems speaking.  He uses language fairly well.  He has had repeat brain scans that showed no changes.  His neuropsychological testings results are good  to be given to him in the next week or so.  I suggested to them they talk to the neurologist and the evaluating psychologist about possible benefits of occupational therapy.  At this time will continue taking Tegretol and Ativan as prescribed.  The Ativan takes 0.5 mg 2 in the morning 2 at night and takes 1 extra if he is particularly emotionally upset.  I suggested that he gets upset and gets nauseated it is very reasonable to give him the as needed Ativan.  This patient drinks no alcohol uses no drugs.  Somewhat he is functioning fairly well.  I think he is having some issues of conflict with his wife.  I believe this that dynamic of work the patient feels somewhat guilty that he is so dependent upon his wife.  This patient to return to see me in 2 months.

## 2019-12-06 ENCOUNTER — Other Ambulatory Visit: Payer: Self-pay

## 2019-12-06 ENCOUNTER — Ambulatory Visit (INDEPENDENT_AMBULATORY_CARE_PROVIDER_SITE_OTHER): Payer: PPO | Admitting: Psychology

## 2019-12-06 DIAGNOSIS — I63511 Cerebral infarction due to unspecified occlusion or stenosis of right middle cerebral artery: Secondary | ICD-10-CM

## 2019-12-06 DIAGNOSIS — F0150 Vascular dementia without behavioral disturbance: Secondary | ICD-10-CM | POA: Diagnosis not present

## 2019-12-06 DIAGNOSIS — F0390 Unspecified dementia without behavioral disturbance: Secondary | ICD-10-CM

## 2019-12-06 NOTE — Progress Notes (Signed)
   Neuropsychology Feedback Session William [name]. [place] [facility] of Neurology  Reason for Referral:   William [name] Smithis a 79 y.o. right-handed Caucasian male referred by Metta Clines, D.O.,to characterize hiscurrent cognitive functioning and assist with diagnostic clarity and treatment planning in the context of a prior right MCA stroke and concerns surrounding progressive cognitive decline.   Feedback:   William [name] completed a comprehensive neuropsychological evaluation on 11/29/2019. Please refer to that encounter for the full report and recommendations. Briefly, results suggested prominent impairments in visuospatial abilities and executive functioning. An additional weakness was noted across semantic fluency while performance variability was further noted across processing speed, complex attention, and encoding (i.e., learning) and retrieval aspects of both verbal and visual memory; however, visual memory represented a noted weakness relative to verbal scores. Relative to his previous evaluation in May 2015, current scores exhibited a good amount of stability. Performance declines generally surrounded encoding/retrieval aspects of a verbal list learning task, mental arithmetic, and across his copy of a complex figure. Regarding etiology, results of cognitive testing continue to be consistent with William [name] history of a very large/severe right MCA stroke. Profound deficits with visuospatial abilities and executive functioning would be expected given the location and size of this event. Deficits in processing speed, attention/concentration, and encoding/retrieval aspects of memory (particularly visual) would also be expected to varying degrees. 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  William [name] was accompanied by his wife during the current telephone call. They were at their residence while I was within my office. Content of the current session  focused on the results of his neuropsychological evaluation. William [name] and his wife were given the opportunity to ask questions and their questions were answered. They were encouraged to reach out should additional questions arise. His report is available to him on MyChart.     17 minutes were spent conducting the current feedback session with William [name], billed as one unit [phone].

## 2019-12-06 NOTE — Patient Instructions (Signed)
Recommendations: A repeat neuropsychological evaluation in 24 months (or sooner if functional decline is noted) is recommended to assess the trajectory of future cognitive decline should it occur. This will also aid in future efforts towards improved diagnostic clarity.  If diagnostic clarity is a primary goal of William [name] and his wife, they could discuss the pros and cons of a PET scan with Dr. Tomi Likens. While this could yield vital information towards arriving at improved diagnostic accuracy, these results likely would not drastically change ongoing treatment efforts.   Given profound deficits with visuospatial abilities and executive functioning, I strongly encourage William [name] to continue refraining from driving.   William [name] is encouraged to attend to lifestyle factors for brain health (e.g., regular physical exercise, good nutrition habits, regular participation in cognitively-stimulating activities, and general stress management techniques), which are likely to have benefits for both emotional adjustment and cognition. In fact, in addition to promoting good general health, regular exercise incorporating aerobic activities (e.g., brisk walking, jogging, cycling, etc.) has been demonstrated to be a very effective treatment for depression and stress, with similar efficacy rates to both antidepressant medication and psychotherapy. Optimal control of vascular risk factors (including safe cardiovascular exercise and adherence to dietary recommendations) is encouraged.  Should there be a progression of his current deficits over time, William [name] is unlikely to regain any independent living skills lost. Therefore, it is recommended that he remain as involved as possible in all aspects of household chores, finances, and medication management, with supervision to ensure adequate performance. He will likely benefit from the establishment and maintenance of a routine in order to maximize his functional abilities  over time.  If interested, there are some activities which have therapeutic value and can be useful in keeping him cognitively stimulated. For suggestions, William [name] is encouraged to go to the following website: https://www.[facility].org/get-to-know-barrow/centers-programs/neurorehabilitation-center/neuro-rehab-apps-and-games/ which has options, categorized by level of difficulty. It should be noted that these activities should not be viewed as a substitute for therapy.  When learning new information, he would benefit from information being broken up into small, manageable pieces. He may also find it helpful to articulate the material in his own words and in a context to promote encoding at the onset of a new task. This material may need to be repeated multiple times to promote encoding.  Memory can be improved using internal strategies such as rehearsal, repetition, chunking, mnemonics, association, and imagery. External strategies such as written notes in a consistently used memory journal, visual and nonverbal auditory cues such as a calendar on the refrigerator or appointments with alarm, such as on a cell phone, can also help maximize recall.    Given his greater difficulty learning and recalling visual information, he might benefit from verbalizing information to be retained. Additionally, when placed into a contextual organizational structure, his memory was improved. This suggests that when presenting him with information, it is valuable to place it in a meaningful context.  To address problems with processing speed, he may wish to consider:   -Ensuring that he is alerted when essential material or instructions are being presented   -Adjusting the speed at which new information is presented   -Allowing for more time in comprehending, processing, and responding in conversation  To address problems with executive dysfunction, he may wish to consider:   -Avoiding external distractions  when needing to concentrate   -Limiting exposure to fast paced environments with multiple sensory demands   -Writing down complicated information and using checklists   -  Attempting and completing one task at a time (i.e., no multi-tasking)   -Verbalizing aloud each step of a task to maintain focus   -Reducing the amount of information considered at one time

## 2019-12-12 ENCOUNTER — Other Ambulatory Visit: Payer: Self-pay | Admitting: Family Medicine

## 2019-12-12 MED FILL — LISINOPRIL-HCTZ 10-12.5 MG: 10-12.5 | 30 days supply | Qty: 30 | Fill #2

## 2019-12-12 MED FILL — FENOFIBRATE 160 MG TABLET: 160 | 90 days supply | Qty: 90 | Fill #0

## 2019-12-31 ENCOUNTER — Other Ambulatory Visit: Payer: Self-pay | Admitting: Family Medicine

## 2019-12-31 DIAGNOSIS — N189 Chronic kidney disease, unspecified: Secondary | ICD-10-CM

## 2020-01-07 DIAGNOSIS — M47816 Spondylosis without myelopathy or radiculopathy, lumbar region: Secondary | ICD-10-CM | POA: Diagnosis not present

## 2020-01-07 DIAGNOSIS — M545 Low back pain: Secondary | ICD-10-CM | POA: Diagnosis not present

## 2020-01-09 MED FILL — LORazepam 0.5 MG TABS: 0.5 | 30 days supply | Qty: 150 | Fill #2

## 2020-01-09 MED FILL — EZETIMIBE 10 MG TABS: 10 | 90 days supply | Qty: 90 | Fill #3

## 2020-01-09 MED FILL — LISINOPRIL-HCTZ 10-12.5 MG: 10-12.5 | 30 days supply | Qty: 30 | Fill #3

## 2020-01-22 ENCOUNTER — Other Ambulatory Visit: Payer: Self-pay | Admitting: Family Medicine

## 2020-01-22 DIAGNOSIS — N401 Enlarged prostate with lower urinary tract symptoms: Secondary | ICD-10-CM

## 2020-01-22 MED FILL — TAMSULOSIN HCL 0.4 MG CAP: 0.4 | 90 days supply | Qty: 90 | Fill #0

## 2020-01-28 ENCOUNTER — Telehealth: Payer: PPO

## 2020-01-29 ENCOUNTER — Other Ambulatory Visit: Payer: Self-pay

## 2020-01-29 ENCOUNTER — Ambulatory Visit (INDEPENDENT_AMBULATORY_CARE_PROVIDER_SITE_OTHER): Payer: PPO | Admitting: Family Medicine

## 2020-01-29 ENCOUNTER — Encounter: Payer: Self-pay | Admitting: Family Medicine

## 2020-01-29 VITALS — BP 114/55 | HR 70 | Temp 98.1°F | Resp 12 | Ht 70.0 in | Wt 216.0 lb

## 2020-01-29 DIAGNOSIS — F0150 Vascular dementia without behavioral disturbance: Secondary | ICD-10-CM | POA: Diagnosis not present

## 2020-01-29 DIAGNOSIS — Z0000 Encounter for general adult medical examination without abnormal findings: Secondary | ICD-10-CM | POA: Diagnosis not present

## 2020-01-29 DIAGNOSIS — R3911 Hesitancy of micturition: Secondary | ICD-10-CM

## 2020-01-29 DIAGNOSIS — F0390 Unspecified dementia without behavioral disturbance: Secondary | ICD-10-CM

## 2020-01-29 DIAGNOSIS — I6523 Occlusion and stenosis of bilateral carotid arteries: Secondary | ICD-10-CM | POA: Diagnosis not present

## 2020-01-29 DIAGNOSIS — E785 Hyperlipidemia, unspecified: Secondary | ICD-10-CM | POA: Diagnosis not present

## 2020-01-29 DIAGNOSIS — M79604 Pain in right leg: Secondary | ICD-10-CM | POA: Diagnosis not present

## 2020-01-29 DIAGNOSIS — Z23 Encounter for immunization: Secondary | ICD-10-CM

## 2020-01-29 DIAGNOSIS — N289 Disorder of kidney and ureter, unspecified: Secondary | ICD-10-CM

## 2020-01-29 DIAGNOSIS — N401 Enlarged prostate with lower urinary tract symptoms: Secondary | ICD-10-CM | POA: Diagnosis not present

## 2020-01-29 DIAGNOSIS — I10 Essential (primary) hypertension: Secondary | ICD-10-CM

## 2020-01-29 DIAGNOSIS — F418 Other specified anxiety disorders: Secondary | ICD-10-CM

## 2020-01-29 DIAGNOSIS — F419 Anxiety disorder, unspecified: Secondary | ICD-10-CM | POA: Insufficient documentation

## 2020-01-29 DIAGNOSIS — M79605 Pain in left leg: Secondary | ICD-10-CM

## 2020-01-29 LAB — COMPREHENSIVE METABOLIC PANEL
ALT: 15 U/L (ref 0–53)
AST: 18 U/L (ref 0–37)
Albumin: 4.2 g/dL (ref 3.5–5.2)
Alkaline Phosphatase: 53 U/L (ref 39–117)
BUN: 31 mg/dL — ABNORMAL HIGH (ref 6–23)
CO2: 28 mEq/L (ref 19–32)
Calcium: 10.4 mg/dL (ref 8.4–10.5)
Chloride: 104 mEq/L (ref 96–112)
Creatinine, Ser: 2.12 mg/dL — ABNORMAL HIGH (ref 0.40–1.50)
GFR: 30.4 mL/min — ABNORMAL LOW (ref >60.00)
Glucose, Bld: 108 mg/dL — ABNORMAL HIGH (ref 70–99)
Potassium: 4.1 mEq/L (ref 3.5–5.1)
Sodium: 140 mEq/L (ref 135–145)
Total Bilirubin: 0.4 mg/dL (ref 0.2–1.2)
Total Protein: 7.3 g/dL (ref 6.0–8.3)

## 2020-01-29 LAB — CBC WITH DIFFERENTIAL/PLATELET
Basophils Absolute: 0.1 10*3/uL (ref 0.0–0.1)
Basophils Relative: 0.8 % (ref 0.0–3.0)
Eosinophils Absolute: 0.3 10*3/uL (ref 0.0–0.7)
Eosinophils Relative: 4.2 % (ref 0.0–5.0)
HCT: 37.6 % — ABNORMAL LOW (ref 39.0–52.0)
Hemoglobin: 12.6 g/dL — ABNORMAL LOW (ref 13.0–17.0)
Lymphocytes Relative: 23.4 % (ref 12.0–46.0)
Lymphs Abs: 1.7 10*3/uL (ref 0.7–4.0)
MCHC: 33.6 g/dL (ref 30.0–36.0)
MCV: 94.3 fl (ref 78.0–100.0)
Monocytes Absolute: 0.6 10*3/uL (ref 0.1–1.0)
Monocytes Relative: 8.1 % (ref 3.0–12.0)
Neutro Abs: 4.6 10*3/uL (ref 1.4–7.7)
Neutrophils Relative %: 63.5 % (ref 43.0–77.0)
Platelets: 262 10*3/uL (ref 150.0–400.0)
RBC: 3.99 Mil/uL — ABNORMAL LOW (ref 4.22–5.81)
RDW: 13.7 % (ref 11.5–15.5)
WBC: 7.2 10*3/uL (ref 4.0–10.5)

## 2020-01-29 LAB — LIPID PANEL
Cholesterol: 183 mg/dL (ref 0–200)
HDL: 54.1 mg/dL (ref >39.00)
LDL Cholesterol: 112 mg/dL — ABNORMAL HIGH (ref 0–99)
NonHDL: 128.47
Total CHOL/HDL Ratio: 3
Triglycerides: 82 mg/dL (ref 0.0–149.0)
VLDL: 16.4 mg/dL (ref 0.0–40.0)

## 2020-01-29 NOTE — Assessment & Plan Note (Signed)
Per neuro 

## 2020-01-29 NOTE — Assessment & Plan Note (Signed)
Per nephrology 

## 2020-01-29 NOTE — Assessment & Plan Note (Signed)
Encouraged heart healthy diet, increase exercise, avoid trans fats, consider a krill oil cap daily 

## 2020-01-29 NOTE — Assessment & Plan Note (Signed)
Pt will see Dr Nelva Bush soon and will discuss this and if it may be related to his back' May be from zetia--- will do trial off zetia and see if it improves

## 2020-01-29 NOTE — Assessment & Plan Note (Signed)
Per psych 

## 2020-01-29 NOTE — Assessment & Plan Note (Signed)
Well controlled, no changes to meds. Encouraged heart healthy diet such as the DASH diet and exercise as tolerated.  °

## 2020-01-29 NOTE — Assessment & Plan Note (Signed)
Per neuro and psych

## 2020-01-29 NOTE — Assessment & Plan Note (Signed)
ghm utd Check labs  See AVS Pneum 23 given today

## 2020-01-29 NOTE — Progress Notes (Signed)
Patient ID: William [name], male    DOB: 01-Oct-1940  Age: 79 y.o. MRN: [id number]    Subjective:  Subjective  HPI William [name] presents for cpe.  He c/o b/l leg pain still ABI essentially normal.  Injection in back helped some--- he sees Dr Nelva Bush 9/2 and will discuss it some more.   It he he feels it is not related to the back we can refer to vascular.  Pt has appointment with nephrology coming up as well.    Review of Systems  Constitutional: Negative.   HENT: Negative for congestion, ear pain, hearing loss, nosebleeds, postnasal drip, rhinorrhea, sinus pressure, sneezing and tinnitus.   Eyes: Negative for photophobia, discharge, itching and visual disturbance.  Respiratory: Negative.   Cardiovascular: Negative.   Gastrointestinal: Negative for abdominal distention, abdominal pain, anal bleeding, blood in stool and constipation.  Endocrine: Negative.   Genitourinary: Negative.   Musculoskeletal: Positive for arthralgias, back pain and myalgias.  Skin: Negative.   Allergic/Immunologic: Negative.   Neurological: Negative for dizziness, weakness, light-headedness, numbness and headaches.  Psychiatric/Behavioral: Negative for agitation, confusion, decreased concentration, dysphoric mood, sleep disturbance and suicidal ideas. The patient is not nervous/anxious.     History Past Medical History:  Diagnosis Date  . Arthritis    low back  . 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 Bladder stone   . BPH (benign prostatic hyperplasia) 08/06/2007  . Carotid artery occlusion   . 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 Closed fracture of fifth metacarpal bone 05/15/2015  . Eczema   . Fasting hyperglycemia 12/21/2006  . GERD (gastroesophageal reflux disease)   . History of carotid artery stenosis    S/P BILATERAL CEA  . 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 HTN (hypertension) 07/19/2015  . Hyperlipidemia   . Hypertension   . Major neurocognitive disorder  01/09/2014   Mild, related to stroke history  . Nocturia   . Renal insufficiency 06/25/2013  . S/P carotid endarterectomy    BILATERAL ICA--  PATENT PER DUPLEX  05-19-2012  . 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 Urinary frequency   . Vitamin D deficiency     He has a past surgical history that includes Carotid endarterectomy (Bilateral, LEFT  11-12-2008  DR GREG HAYES); Inguinal hernia repair (Right, 11-08-2006); Cardiovascular stress test (03-27-2012  DR CRENSHAW); Appendectomy (AS CHILD); Cystoscopy with litholapaxy (N/A, 02/26/2013); Transurethral resection of prostate (N/A, 02/26/2013); Prostate surgery; Mohs surgery (Left, 1/ 2016); Eye surgery (Jan. 2016); and Mass excision (N/A, 03/03/2016).   His family history includes Bipolar disorder in his mother; Heart disease in his father and mother.He reports that he quit smoking about 14 years ago. His smoking use included cigarettes. He has a 80.00 pack-year smoking history. He has never used smokeless tobacco. He reports current alcohol use. He reports that he does not use drugs.  Current Outpatient Medications on File Prior to Visit  Medication Sig Dispense Refill  . acetaminophen (TYLENOL) 500 MG tablet Take 500 mg by mouth every 6 (six) hours as needed (1 tab as needed).    [name] amLODipine (NORVASC) 5 MG tablet TAKE 1 TABLET (5 MG TOTAL) BY MOUTH DAILY. 90 tablet 1  . aspirin 325 MG EC tablet Take 325 mg by mouth daily.    . carbamazepine (TEGRETOL-XR) 100 MG 12 hr tablet 1 qam  1  qhs 180 tablet 2  . Cholecalciferol (VITAMIN D) 2000 UNITS CAPS Take by mouth.    . docusate sodium (COLACE) 100 MG capsule Take 100 mg by mouth daily.    . Eszopiclone 3 MG TABS TAKE 1 TABLET BY MOUTH AT BEDTIME AS NEEDED TAKE IMMEDIATELY BEFORE BEDTIME 90 tablet 2  . ezetimibe (ZETIA) 10 MG tablet TAKE 1 TABLET (10 MG TOTAL) BY MOUTH DAILY. 90 tablet 3  . famotidine (PEPCID) 20 MG tablet Take 20 mg by mouth  2 (two) times daily.    . fenofibrate 160 MG tablet Take 1 tablet (160 mg total) by mouth daily. 90 tablet 1  . Flaxseed, Linseed, (FLAX SEED OIL PO) Take by mouth daily.      . fluticasone (CUTIVATE) 0.05 % cream   3  . lisinopril-hydrochlorothiazide (ZESTORETIC) 10-12.5 MG tablet Take 1 tablet by mouth daily.    [name] LORazepam (ATIVAN) 0.5 MG tablet 2  Bid  1 as needed 150 tablet 5  . Multiple Vitamin (MULTIVITAMIN) tablet Take 1 tablet by mouth daily.    . NONFORMULARY OR COMPOUNDED ITEM [facility]:  Onychomycosis Nail lacquer - Fluconazole 2%, Terbinafine 1%, DMSO, apply to affected area daily. 120 each 2  . NONFORMULARY OR COMPOUNDED ITEM [facility]:  Antifungal topical - Terbinafine 3%, Fluconazole 2%, Tea Tree Oil 5%, Urea 10/%,, Ibuprofen 2% in DMSO Suspension #43ml. Apply to the affected nail(s) once (at bedtime) or twice daily. 100 each 2  . NONFORMULARY OR COMPOUNDED ITEM Compression socks  #1  Dx low ext edema and ache 1 each 0  . ondansetron (ZOFRAN ODT) 4 MG disintegrating tablet Take 1 tablet (4 mg total) by mouth every 8 (eight) hours as needed for nausea or vomiting. 20 tablet 0  . tamsulosin (FLOMAX) 0.4 MG CAPS capsule TAKE 1 CAPSULE BY MOUTH ONCE DAILY 90 capsule 3  . traMADol (ULTRAM) 50 MG tablet Take 1 tablet (50 mg total) by mouth every 8 (eight) hours as needed. 30 tablet 0   No current facility-administered medications on file prior to visit.     Objective:  Objective  Physical Exam Vitals and nursing note reviewed.  Constitutional:      General: He is sleeping. He is not in acute distress.    Appearance: He is well-developed. He is not diaphoretic.  HENT:     Head: Normocephalic and atraumatic.     Right Ear: External ear normal.     Left Ear: External ear normal.     Nose: Nose normal.     Mouth/Throat:     Pharynx: No oropharyngeal exudate.  Eyes:     General:        Right eye: No discharge.        Left eye: No discharge.      Conjunctiva/sclera: Conjunctivae normal.     Pupils: Pupils are equal, round, and reactive to light.  Neck:     Thyroid: No thyromegaly.     Vascular: No JVD.  Cardiovascular:     Rate and Rhythm: Normal rate and regular rhythm.     Heart sounds: No murmur heard.   Pulmonary:     Effort: Pulmonary effort is normal. No respiratory distress.     Breath sounds: Normal breath sounds. No wheezing or rales.  Chest:     Chest wall: No tenderness.  Abdominal:     General: Bowel sounds are normal. There is no distension.     Palpations: Abdomen is soft. There is no mass.  Tenderness: There is no abdominal tenderness. There is no guarding or rebound.  Genitourinary:    Comments: Per urology Musculoskeletal:        General: No tenderness. Normal range of motion.     Cervical back: Normal range of motion and neck supple.  Lymphadenopathy:     Cervical: No cervical adenopathy.  Skin:    General: Skin is warm and dry.     Findings: No erythema or rash.  Neurological:     Mental Status: He is oriented to person, place, and time.     Cranial Nerves: No cranial nerve deficit.     Motor: No abnormal muscle tone.     Deep Tendon Reflexes: Reflexes are normal and symmetric. Reflexes normal.  Psychiatric:        Behavior: Behavior normal.        Thought Content: Thought content normal.        Judgment: Judgment normal.    BP (!) 114/55 (BP Location: Right Arm, Patient Position: Sitting, Cuff Size: Large)   Pulse 70   Temp 98.1 F (36.7 C) (Oral)   Resp 12   Ht 5\' 10"  (1.778 m)   Wt 216 lb (98 kg)   SpO2 97%   BMI 30.99 kg/m  Wt Readings from Last 3 Encounters:  01/29/20 216 lb (98 kg)  09/11/19 216 lb 3.2 oz (98.1 kg)  08/14/19 218 lb (98.9 kg)     Lab Results  Component Value Date   WBC 8.5 09/12/2019   HGB 12.6 (L) 09/12/2019   HCT 37.5 (L) 09/12/2019   PLT 281.0 09/12/2019   GLUCOSE 140 (H) 09/12/2019   CHOL 166 07/30/2019   TRIG 83.0 07/30/2019   HDL 56.00  07/30/2019   LDLCALC 94 07/30/2019   ALT 58 (H) 09/12/2019   AST 29 09/12/2019   NA 140 09/12/2019   K 3.8 09/12/2019   CL 103 09/12/2019   CREATININE 1.97 (H) 09/12/2019   BUN 35 (H) 09/12/2019   CO2 27 09/12/2019   TSH 1.220 04/25/2019   PSA 2.10 12/24/2016   INR 1.0 11/08/2008   HGBA1C 5.8 (H) 09/17/2019    MR LUMBAR SPINE WO CONTRAST  Result Date: 11/15/2019 CLINICAL DATA:  Bilateral leg pain and weakness EXAM: MRI LUMBAR SPINE WITHOUT CONTRAST TECHNIQUE: Multiplanar, multisequence MR imaging of the lumbar spine was performed. No intravenous contrast was administered. COMPARISON:  Lumbar MRI 08/06/2010 FINDINGS: Segmentation:  Normal Alignment:  Slight retrolisthesis L2-3.  Otherwise normal alignment. Vertebrae:  Negative for fracture or mass.  Normal bone marrow. Conus medullaris and cauda equina: Conus extends to the T12-L1 level. Conus and cauda equina appear normal. Paraspinal and other soft tissues: Negative for paraspinous mass or adenopathy. 5 cm right renal cyst, not covered on the prior study. Disc levels: T12-L1: Negative L1-2: Mild facet degeneration. Negative for disc protrusion or stenosis. L2-3: Mild disc and mild facet degeneration. Negative for disc protrusion or stenosis. L3-4: Asymmetric disc degeneration on the right. Small right-sided disc and osteophyte complex causing subarticular stenosis on the right. This is similar to the prior study. Bilateral mild facet degeneration. Spinal canal adequate in size. L4-5: Mild disc and facet degeneration. Mild subarticular stenosis bilaterally L5-S1: Mild facet degeneration without significant stenosis. IMPRESSION: Lumbar degenerative changes are stable from 2012. No new or acute finding. Electronically Signed   By: Franchot Gallo M.D.   On: 11/15/2019 14:32      Health Maintenance  Topic Date Due  . INFLUENZA VACCINE  01/13/2020  .  COLONOSCOPY  02/27/2020  . TETANUS/TDAP  10/18/2021  . COVID-19 Vaccine  Completed  . Hepatitis  C Screening  Completed  . PNA vac Low Risk Adult  Completed   Assessment & Plan:  Plan  I am having Bruna Potter maintain his (Flaxseed, Linseed, (FLAX SEED OIL PO)), aspirin, multivitamin, Vitamin D, fluticasone, NONFORMULARY OR COMPOUNDED ITEM, traMADol, NONFORMULARY OR COMPOUNDED ITEM, famotidine, lisinopril-hydrochlorothiazide, NONFORMULARY OR COMPOUNDED ITEM, acetaminophen, ezetimibe, docusate sodium, amLODipine, Eszopiclone, ondansetron, carbamazepine, LORazepam, fenofibrate, and tamsulosin.  No orders of the defined types were placed in this encounter.   Problem List Items Addressed This Visit      Unprioritized   BPH (benign prostatic hyperplasia)    Per neuro      Depression with anxiety    Per psych      Relevant Orders   Carbamazepine Level (Tegretol), total   Dyslipidemia    Encouraged heart healthy diet, increase exercise, avoid trans fats, consider a krill oil cap daily      Relevant Orders   Lipid panel   CBC with Differential/Platelet   Comprehensive metabolic panel   HTN (hypertension)    Well controlled, no changes to meds. Encouraged heart healthy diet such as the DASH diet and exercise as tolerated.        Leg pain, bilateral    Pt will see Dr Nelva Bush soon and will discuss this and if it may be related to his back' May be from zetia--- will do trial off zetia and see if it improves       Major vascular neurocognitive disorder    Per neuro and psych      Preventative health care - Primary    ghm utd Check labs  See AVS Pneum 23 given today      Renal insufficiency    Per nephrology       Other Visit Diagnoses    Need for pneumococcal vaccination       Relevant Orders   Pneumococcal polysaccharide vaccine 23-valent greater than or equal to 2yo subcutaneous/IM (Completed)   Bilateral carotid artery stenosis       Relevant Orders   VAS US CAROTID      Follow-up: Return in about 6 months (around 07/31/2020), or if symptoms worsen or  fail to improve, for hypertension, hyperlipidemia.  Ann Held, DO

## 2020-01-29 NOTE — Patient Instructions (Signed)
Preventive Care 89 Years and Older, Male Preventive care refers to lifestyle choices and visits with your health care provider that can promote health and wellness. This includes:  A yearly physical exam. This is also called an annual well check.  Regular dental and eye exams.  Immunizations.  Screening for certain conditions.  Healthy lifestyle choices, such as diet and exercise. What can I expect for my preventive care visit? Physical exam Your health care provider will check:  Height and weight. These may be used to calculate body mass index (BMI), which is a measurement that tells if you are at a healthy weight.  Heart rate and blood pressure.  Your skin for abnormal spots. Counseling Your health care provider may ask you questions about:  Alcohol, tobacco, and drug use.  Emotional well-being.  Home and relationship well-being.  Sexual activity.  Eating habits.  History of falls.  Memory and ability to understand (cognition).  Work and work Statistician. What immunizations do I need?  Influenza (flu) vaccine  This is recommended every year. Tetanus, diphtheria, and pertussis (Tdap) vaccine  You may need a Td booster every 10 years. Varicella (chickenpox) vaccine  You may need this vaccine if you have not already been vaccinated. Zoster (shingles) vaccine  You may need this after age 70. Pneumococcal conjugate (PCV13) vaccine  One dose is recommended after age 40. Pneumococcal polysaccharide (PPSV23) vaccine  One dose is recommended after age 24. Measles, mumps, and rubella (MMR) vaccine  You may need at least one dose of MMR if you were born in 1957 or later. You may also need a second dose. Meningococcal conjugate (MenACWY) vaccine  You may need this if you have certain conditions. Hepatitis A vaccine  You may need this if you have certain conditions or if you travel or work in places where you may be exposed to hepatitis A. Hepatitis B  vaccine  You may need this if you have certain conditions or if you travel or work in places where you may be exposed to hepatitis B. Haemophilus influenzae type b (Hib) vaccine  You may need this if you have certain conditions. You may receive vaccines as individual doses or as more than one vaccine together in one shot (combination vaccines). Talk with your health care provider about the risks and benefits of combination vaccines. What tests do I need? Blood tests  Lipid and cholesterol levels. These may be checked every 5 years, or more frequently depending on your overall health.  Hepatitis C test.  Hepatitis B test. Screening  Lung cancer screening. You may have this screening every year starting at age 67 if you have a 30-pack-year history of smoking and currently smoke or have quit within the past 15 years.  Colorectal cancer screening. All adults should have this screening starting at age 77 and continuing until age 8. Your health care provider may recommend screening at age 74 if you are at increased risk. You will have tests every 1-10 years, depending on your results and the type of screening test.  Prostate cancer screening. Recommendations will vary depending on your family history and other risks.  Diabetes screening. This is done by checking your blood sugar (glucose) after you have not eaten for a while (fasting). You may have this done every 1-3 years.  Abdominal aortic aneurysm (AAA) screening. You may need this if you are a current or former smoker.  Sexually transmitted disease (STD) testing. Follow these instructions at home: Eating and drinking  Eat  a diet that includes fresh fruits and vegetables, whole grains, lean protein, and low-fat dairy products. Limit your intake of foods with high amounts of sugar, saturated fats, and salt.  Take vitamin and mineral supplements as recommended by your health care provider.  Do not drink alcohol if your health care  provider tells you not to drink.  If you drink alcohol: ? Limit how much you have to 0-2 drinks a day. ? Be aware of how much alcohol is in your drink. In the [place] one drink equals one 12 oz bottle of beer (355 mL), one 5 oz glass of wine (148 mL), or one 1 oz glass of hard liquor (44 mL). Lifestyle  Take daily care of your teeth and gums.  Stay active. Exercise for at least 30 minutes on 5 or more days each week.  Do not use any products that contain nicotine or tobacco, such as cigarettes, e-cigarettes, and chewing tobacco. If you need help quitting, ask your health care provider.  If you are sexually active, practice safe sex. Use a condom or other form of protection to prevent STIs (sexually transmitted infections).  Talk with your health care provider about taking a low-dose aspirin or statin. What's next?  Visit your health care provider once a year for a well check visit.  Ask your health care provider how often you should have your eyes and teeth checked.  Stay up to date on all vaccines. This information is not intended to replace advice given to you by your health care provider. Make sure you discuss any questions you have with your health care provider. Document Revised: 05/25/2018 Document Reviewed: 05/25/2018 Elsevier Patient Education  2020 [employer].

## 2020-01-30 ENCOUNTER — Ambulatory Visit (HOSPITAL_COMMUNITY)
Admission: RE | Admit: 2020-01-30 | Discharge: 2020-01-30 | Disposition: A | Discharge status: Home / Self Care | Payer: PPO | Source: Ambulatory Visit | Attending: Family Medicine | Admitting: Family Medicine

## 2020-01-30 DIAGNOSIS — I6523 Occlusion and stenosis of bilateral carotid arteries: Secondary | ICD-10-CM | POA: Insufficient documentation

## 2020-01-30 LAB — CARBAMAZEPINE LEVEL, TOTAL: Carbamazepine Lvl: 4.6 mg/L (ref 4.0–12.0)

## 2020-01-31 DIAGNOSIS — M47816 Spondylosis without myelopathy or radiculopathy, lumbar region: Secondary | ICD-10-CM | POA: Diagnosis not present

## 2020-02-04 NOTE — Progress Notes (Signed)
NEUROLOGY FOLLOW UP OFFICE NOTE  ZAEDEN LASTINGER [id number]  HISTORY OF PRESENT ILLNESS: William [name] is a 79 year old manwith dementia, CKD, HTN, CAD, HLD and history of CVAwho follows up for dementia.Marland KitchenHe is accompanied by his wife who supplements history.  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    Due to ongoing unsteady gait with bilateral leg pain, MRI of lumbar spine was performed on 11/14/2019, which was personally reviewed and demonstrated degenerative changes stable compared to 2012 with L3-4 right subarticular stenosis.  He had a repeat epidural injection which unfortunately was not effective this time.  LDL from 01/29/2020 was 112. Carotid doppler from 01/30/2020 showed 1-39% bilateral ICA stenosis.   HISTORY: 1. Bilateral Leg Pain &Weakness: Prior to COVID, he was going to the gym and seeing a personal trainer but it stopped once the pandemic hit. Since the pandemic, he has been sedentary. He walks the dog once or twice a week. He started having bilateral leg painlater that year in 2020. No associated back pain. No shooting pain. It is described as an aching in his calves. No associated numbness. He also reports weakness. When he walks, he has trouble picking up his legs. He seems to shuffle his feet. When he gets up to stand, he feels like he can fall forward. He had one fall while standing at the toilet and he bent forward to spit in toilet. No change in bowel habit. He was having urinary issues so he was taken off of Flomax. Vascular US ABI from 12/08/18 showed abnormal toe-brachial index in both lower extremities but otherwise unremarkable.MRI of cervical spine without contrast from  03/10/2019 showed some cervical degenerative disc disease and spondylosis with mild foraminal narrowing but no spinal stenosis or compressive myelopathy.  He had been started on new medications a few monthsprior. He was switched from lisinopril to Norvasc and then lisinopril was added again with HCTZ in order to help with the fluid in the legs.  He does have history of low back painand lumbar spondylosis.He had an epidural injection in the lumbar regionperformed by Dr. Nelva Bush in 2053for low back pain which was effective.   He previously had leg pain on a statin so he was switched to Zetia. It helped for awhile.  2. Dementia: He had a stroke in 2006, after experiencing left facial droop, left arm and leg weakness, as well slurred vision and vision problems. 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 He has some residual left sided weakness and facial weakness. He underwent left carotid endarterectomy in 2010 for asymptomatic stenosis.  Beginning around 2013, he and his wife have noted a gradual progression and cognitive deficits. Onset correlated during a period when he was experiencing dizziness and syncope secondary to orthostatic hypotension. At that time, he was dehydrated and not drinking much fluids. He was instructed to start increasing his fluid intake as well as his sodium intake. Now, he tries to drink 3 bottles of water per day. He particularly notes confusion regarding how to perform certain everyday tasks. For example, it takes him a long time to unload the dishwasher because he has trouble figuring out where to put the dishes. He use to enjoy cooking and preparing meals. Now he has difficulty cooking and can only  see things up in the microwave. He also has been experiencing dressing apraxia. Sometimes he will put both his feet into one leg of his pants. Other times, he has difficulty buttoning his shirts. He denies any language  dysfunction such as difficulty understanding other people or other people not understanding him. He has no trouble reading or writing. His short-term and long-term memory are pretty good. He denies any difficulty with face recognition. He denies hallucinations or delusions. He has not had any change in his personality or behavior. He still drives but very seldom and only locally. He has not had any problems with accidents or near accidents, but he says he drives slowly because he is very nervous and cautious. He does have history of anxiety and often shakes when he gets nervous. He denies any family history of dementia. However, there is a psychiatric history with his mother and sister. He denies history of alcohol abuse or drug use.  He underwent neuropsychological testing on 10/25/13 with Dr. Macarthur Critchley at [place] suggested mild dementia due to his stroke, but also underlying neurodegenerative process. Memory and left hemispheric medial temporal functions were intact. Findings showed deficits in visual processing and executive dysfunction. Onset of visual-spatial deficits and apraxia may suggest occipital lobe involvement, such as due to an Alzheimer's variant called posterior cortical atrophy. MRI from May 2015 revealed global atrophy but it does not seem more prominent in the occipital lobes.  He developed increased confusion in February 2021. At physical therapy, he was noted to have increased difficulty using his left side (affected side from his stroke). He had difficulty performing finger-thumb tapping and had trouble gripping the handle of the exercise machine with his left hand. He also has had acute cognitive changes. He started having trouble dialing his daughter's phone number which is new.  MRI of brain without contrast performed on 08/15/2019 was personally reviewed and showed remote large right hemispheric stroke and chronic small vessel ischemic changes, stable compared  to prior MRI from 2015, no acute intracranial abnormality  Prior medication: Aricept (diarrhea, vivid dreams); galantamine (nausea)  12/24/13 LABS: LDL 49,  10/16/13 LABS: B12 720, TSH 1.668 10/19/13 MRI BRAIN WO: stable remote large right MCA territory infarct. Chronic microvascular ischemic changes. 05/21/13 LABS: Hgb A1c 6 05/17/13 Carotid doppler: bilateral 1-39% stenosis of the right proximal ICA and no stenosis of the left ICA. 03/11/12 MRI Brain wo contrast: Remote large right MCA infarct. Dementia with behavioral symptoms. Etiology unclear. 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 Arthritis    low back  . 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 Bladder stone   . BPH (benign prostatic hyperplasia) 08/06/2007  . Carotid artery occlusion   . 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 Closed fracture of fifth metacarpal bone 05/15/2015  . Eczema   . Fasting hyperglycemia 12/21/2006  . GERD (gastroesophageal reflux disease)   . History of carotid artery stenosis    S/P BILATERAL CEA  . 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 HTN (hypertension) 07/19/2015  . Hyperlipidemia   . Hypertension   . 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 Nocturia   . Renal insufficiency 06/25/2013  . S/P carotid endarterectomy    BILATERAL ICA--  PATENT  PER DUPLEX  05-19-2012  . 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 Urinary frequency   . Vitamin D deficiency     MEDICATIONS: Current Outpatient Medications on File Prior to Visit  Medication Sig Dispense Refill  . acetaminophen (TYLENOL) 500 MG tablet Take 500 mg by mouth every 6 (six) hours as needed (1 tab as needed).    [name] amLODipine  (NORVASC) 5 MG tablet TAKE 1 TABLET (5 MG TOTAL) BY MOUTH DAILY. 90 tablet 1  . aspirin 325 MG EC tablet Take 325 mg by mouth daily.    . carbamazepine (TEGRETOL-XR) 100 MG 12 hr tablet 1 qam  1  qhs 180 tablet 2  . Cholecalciferol (VITAMIN D) 2000 UNITS CAPS Take by mouth.    . docusate sodium (COLACE) 100 MG capsule Take 100 mg by mouth daily.    . Eszopiclone 3 MG TABS TAKE 1 TABLET BY MOUTH AT BEDTIME AS NEEDED TAKE IMMEDIATELY BEFORE BEDTIME 90 tablet 2  . ezetimibe (ZETIA) 10 MG tablet TAKE 1 TABLET (10 MG TOTAL) BY MOUTH DAILY. 90 tablet 3  . famotidine (PEPCID) 20 MG tablet Take 20 mg by mouth 2 (two) times daily.    . fenofibrate 160 MG tablet Take 1 tablet (160 mg total) by mouth daily. 90 tablet 1  . Flaxseed, Linseed, (FLAX SEED OIL PO) Take by mouth daily.      . fluticasone (CUTIVATE) 0.05 % cream   3  . lisinopril-hydrochlorothiazide (ZESTORETIC) 10-12.5 MG tablet Take 1 tablet by mouth daily.    [name] LORazepam (ATIVAN) 0.5 MG tablet 2  Bid  1 as needed 150 tablet 5  . Multiple Vitamin (MULTIVITAMIN) tablet Take 1 tablet by mouth daily.    . NONFORMULARY OR COMPOUNDED ITEM [facility]:  Onychomycosis Nail lacquer - Fluconazole 2%, Terbinafine 1%, DMSO, apply to affected area daily. 120 each 2  . NONFORMULARY OR COMPOUNDED ITEM [facility]:  Antifungal topical - Terbinafine 3%, Fluconazole 2%, Tea Tree Oil 5%, Urea 10/%,, Ibuprofen 2% in DMSO Suspension #57ml. Apply to the affected nail(s) once (at bedtime) or twice daily. 100 each 2  . NONFORMULARY OR COMPOUNDED ITEM Compression socks  #1  Dx low ext edema and ache 1 each 0  . ondansetron (ZOFRAN ODT) 4 MG disintegrating tablet Take 1 tablet (4 mg total) by mouth every 8 (eight) hours as needed for nausea or vomiting. 20 tablet 0  . tamsulosin (FLOMAX) 0.4 MG CAPS capsule TAKE 1 CAPSULE BY MOUTH ONCE DAILY 90 capsule 3  . traMADol (ULTRAM) 50 MG tablet Take 1 tablet (50 mg total) by mouth every 8 (eight) hours as needed.  30 tablet 0   No current facility-administered medications on file prior to visit.    ALLERGIES: Allergies  Allergen Reactions  . Other Anaphylaxis  . Strawberry Extract Hives  . Adhesive [Tape] Other (See Comments)    BLISTER  . Latex     Reaction to Plastic Tape  . Statins Other (See Comments)    myalgias  . Strawberry (Diagnostic) Hives    FAMILY HISTORY: Family History  Problem Relation Age of Onset  . Heart disease Mother        CHF  . Bipolar disorder Mother   . Heart disease Father        CHF    SOCIAL HISTORY: Social History   Socioeconomic History  . Marital status: Married    Spouse name: Not on  file  . Number of children: 2  . Years of education: 56  . Highest education level: High school graduate  Occupational History    Employer: Retired  Tobacco Use  . Smoking status: Former Smoker    Packs/day: 2.00    Years: 40.00    Pack years: 80.00    Types: Cigarettes    Quit date: 02/15/2005    Years since quitting: 14.9  . Smokeless tobacco: Never Used  Vaping Use  . Vaping Use: Never used  Substance and Sexual Activity  . Alcohol use: Yes    Alcohol/week: 0.0 standard drinks    Comment: Occasional  . Drug use: No  . Sexual activity: Yes    Partners: Female  Other Topics Concern  . Not on file  Social History Narrative   Exercise--  Walks dogs everyday      LIves with wife , no stairs in home, caffeine - one cup coffee day, exercise - not much, Right handed, 12th grade, retired      One story home   Social Determinants of Health   Financial Resource Strain:   . Difficulty of Paying Living Expenses: Not on file  Food Insecurity:   . Worried About Charity fundraiser in the Last Year: Not on file  . Ran Out of Food in the Last Year: Not on file  Transportation Needs:   . Lack of Transportation (Medical): Not on file  . Lack of Transportation (Non-Medical): Not on file  Physical Activity:   . Days of Exercise per Week: Not on file  .  Minutes of Exercise per Session: Not on file  Stress:   . Feeling of Stress : Not on file  Social Connections:   . Frequency of Communication with Friends and Family: Not on file  . Frequency of Social Gatherings with Friends and Family: Not on file  . Attends Religious Services: Not on file  . Active Member of Clubs or Organizations: Not on file  . Attends Archivist Meetings: Not on file  . Marital Status: Not on file  Intimate Partner Violence:   . Fear of Current or Ex-Partner: Not on file  . Emotionally Abused: Not on file  . Physically Abused: Not on file  . Sexually Abused: Not on file    PHYSICAL EXAM: Blood pressure (!) 145/71, pulse 75, height 5\' 11"  (1.803 m), weight 218 lb (98.9 kg), SpO2 96 %. General: No acute distress.  Patient appears well-groomed.   Head:  Normocephalic/atraumatic Eyes:  Fundi examined but not visualized Neck: supple, no paraspinal tenderness, full range of motion Heart:  Regular rate and rhythm Lungs:  Clear to auscultation bilaterally Back: No paraspinal tenderness Neurological Exam: alert and oriented to person, place, and time. Recalled 2 of 5 words after 5 minutes, fund of knowledge intact.  Speech fluent and not dysarthric, language intact.  CN II-XII intact. Bulk and tone normal, muscle strength 5-/5 left deltoid and hip flexion, otherwise 5/5 throughout.  Sensation to light touch  intact.  Deep tendon reflexes 2+ throughout, toes downgoing.  Finger to nose  testing intact.  Mild shuffling gait.  Romberg with sway.  IMPRESSION: 1.  Major vascular neurocognitive disorder 2.  Lumbar spondylosis 3.  Hypertension 4.  Hyperlipidemia  PLAN: 1.  Continue current management for secondary stroke prevention:  -  ASA 325mg  daily  -  Blood pressure control  -  Glycemic control (Hgb A1c goal less than 7)  -  LDL goal less  than 70.  Unable to tolerate statins.  May consider Repatha. 2.  Mediterranean diet 3.  Follow up in 6 months.  Metta Clines, DO  CC: Roma Schanz, DO

## 2020-02-05 ENCOUNTER — Other Ambulatory Visit: Payer: Self-pay | Admitting: Family Medicine

## 2020-02-05 ENCOUNTER — Other Ambulatory Visit: Payer: Self-pay

## 2020-02-05 ENCOUNTER — Encounter: Payer: Self-pay | Admitting: Neurology

## 2020-02-05 ENCOUNTER — Ambulatory Visit: Payer: PPO | Admitting: Neurology

## 2020-02-05 VITALS — BP 145/71 | HR 75 | Ht 71.0 in | Wt 218.0 lb

## 2020-02-05 DIAGNOSIS — I10 Essential (primary) hypertension: Secondary | ICD-10-CM

## 2020-02-05 DIAGNOSIS — E785 Hyperlipidemia, unspecified: Secondary | ICD-10-CM | POA: Diagnosis not present

## 2020-02-05 DIAGNOSIS — F0150 Vascular dementia without behavioral disturbance: Secondary | ICD-10-CM

## 2020-02-05 DIAGNOSIS — F0390 Unspecified dementia without behavioral disturbance: Secondary | ICD-10-CM

## 2020-02-05 DIAGNOSIS — M47816 Spondylosis without myelopathy or radiculopathy, lumbar region: Secondary | ICD-10-CM | POA: Diagnosis not present

## 2020-02-05 MED FILL — LORazepam 0.5 MG TABS: 0.5 | 30 days supply | Qty: 150 | Fill #3

## 2020-02-05 MED FILL — AMLODIPINE BESYLATE 5 MG TA: 5 | 90 days supply | Qty: 90 | Fill #0

## 2020-02-05 MED FILL — LISINOPRIL-HCTZ 10-12.5 MG: 10-12.5 | 30 days supply | Qty: 30 | Fill #4

## 2020-02-05 NOTE — Patient Instructions (Addendum)
1.  Continue management of stroke risk factors  -  Aspirin 325mg  daily  -  If you cannot tolerate statins, consider Repatha  -  Blood pressure control  -  Mediterranean diet 2.  Follow up in 6 months..   Mediterranean Diet A Mediterranean diet refers to food and lifestyle choices that are based on the traditions of countries located on the [employer]. This way of eating has been shown to help prevent certain conditions and improve outcomes for people who have chronic diseases, like kidney disease and heart disease. What are tips for following this plan? Lifestyle  Cook and eat meals together with your family, when possible.  Drink enough fluid to keep your urine clear or pale yellow.  Be physically active every day. This includes: ? Aerobic exercise like running or swimming. ? Leisure activities like gardening, walking, or housework.  Get 7-8 hours of sleep each night.  If recommended by your health care provider, drink red wine in moderation. This means 1 glass a day for nonpregnant women and 2 glasses a day for men. A glass of wine equals 5 oz (150 mL). Reading food labels   Check the serving size of packaged foods. For foods such as rice and pasta, the serving size refers to the amount of cooked product, not dry.  Check the total fat in packaged foods. Avoid foods that have saturated fat or trans fats.  Check the ingredients list for added sugars, such as corn syrup. Shopping  At the grocery store, buy most of your food from the areas near the walls of the store. This includes: ? Fresh fruits and vegetables (produce). ? Grains, beans, nuts, and seeds. Some of these may be available in unpackaged forms or large amounts (in bulk). ? Fresh seafood. ? Poultry and eggs. ? Low-fat dairy products.  Buy whole ingredients instead of prepackaged foods.  Buy fresh fruits and vegetables in-season from local farmers markets.  Buy frozen fruits and vegetables in resealable  bags.  If you do not have access to quality fresh seafood, buy precooked frozen shrimp or canned fish, such as tuna, salmon, or sardines.  Buy small amounts of raw or cooked vegetables, salads, or olives from the deli or salad bar at your store.  Stock your pantry so you always have certain foods on hand, such as olive oil, canned tuna, canned tomatoes, rice, pasta, and beans. Cooking  Cook foods with extra-virgin olive oil instead of using butter or other vegetable oils.  Have meat as a side dish, and have vegetables or grains as your main dish. This means having meat in small portions or adding small amounts of meat to foods like pasta or stew.  Use beans or vegetables instead of meat in common dishes like chili or lasagna.  Experiment with different cooking methods. Try roasting or broiling vegetables instead of steaming or sauteing them.  Add frozen vegetables to soups, stews, pasta, or rice.  Add nuts or seeds for added healthy fat at each meal. You can add these to yogurt, salads, or vegetable dishes.  Marinate fish or vegetables using olive oil, lemon juice, garlic, and fresh herbs. Meal planning   Plan to eat 1 vegetarian meal one day each week. Try to work up to 2 vegetarian meals, if possible.  Eat seafood 2 or more times a week.  Have healthy snacks readily available, such as: ? Vegetable sticks with hummus. ? [place] yogurt. ? Fruit and nut trail mix.  Eat balanced meals  throughout the week. This includes: ? Fruit: 2-3 servings a day ? Vegetables: 4-5 servings a day ? Low-fat dairy: 2 servings a day ? Fish, poultry, or lean meat: 1 serving a day ? Beans and legumes: 2 or more servings a week ? Nuts and seeds: 1-2 servings a day ? Whole grains: 6-8 servings a day ? Extra-virgin olive oil: 3-4 servings a day  Limit red meat and sweets to only a few servings a month What are my food choices?  Mediterranean diet ? Recommended  Grains: Whole-grain pasta. Brown  rice. Bulgar wheat. Polenta. Couscous. Whole-wheat bread. [place].  Vegetables: Artichokes. Beets. Broccoli. Cabbage. Carrots. Eggplant. Green beans. Chard. Kale. Spinach. Onions. Leeks. Peas. Squash. Tomatoes. Peppers. Radishes.  Fruits: Apples. Apricots. Avocado. Berries. Bananas. Cherries. Dates. Figs. Grapes. Lemons. Melon. Oranges. Peaches. Plums. Pomegranate.  Meats and other protein foods: Beans. Almonds. Sunflower seeds. Pine nuts. Peanuts. [place]. Salmon. Scallops. Shrimp. [place]. Tilapia. Clams. Oysters. Eggs.  Dairy: Low-fat milk. Cheese. Greek yogurt.  Beverages: Water. Red wine. Herbal tea.  Fats and oils: Extra virgin olive oil. Avocado oil. Grape seed oil.  Sweets and desserts: [place] yogurt with honey. Baked apples. Poached pears. Trail mix.  Seasoning and other foods: Basil. Cilantro. Coriander. Cumin. Mint. Parsley. Sage. Rosemary. Tarragon. Garlic. Oregano. Thyme. Pepper. Balsalmic vinegar. Tahini. Hummus. Tomato sauce. Olives. Mushrooms. ? Limit these  Grains: Prepackaged pasta or rice dishes. Prepackaged cereal with added sugar.  Vegetables: Deep fried potatoes (french fries).  Fruits: Fruit canned in syrup.  Meats and other protein foods: Beef. Pork. Lamb. Poultry with skin. Hot dogs. Bernie[place] Salines.  Dairy: Ice cream. Sour cream. Whole milk.  Beverages: Juice. Sugar-sweetened soft drinks. Beer. Liquor and spirits.  Fats and oils: Butter. Canola oil. Vegetable oil. Beef fat (tallow). Lard.  Sweets and desserts: Cookies. Cakes. Pies. Candy.  Seasoning and other foods: Mayonnaise. Premade sauces and marinades. The items listed may not be a complete list. Talk with your dietitian about what dietary choices are right for you. Summary  The Mediterranean diet includes both food and lifestyle choices.  Eat a variety of fresh fruits and vegetables, beans, nuts, seeds, and whole grains.  Limit the amount of red meat and sweets that you eat.  Talk with your health  care provider about whether it is safe for you to drink red wine in moderation. This means 1 glass a day for nonpregnant women and 2 glasses a day for men. A glass of wine equals 5 oz (150 mL). This information is not intended to replace advice given to you by your health care provider. Make sure you discuss any questions you have with your health care provider. Document Revised: 01/29/2016 Document Reviewed: 01/22/2016 Elsevier Patient Education  [place].

## 2020-02-08 ENCOUNTER — Other Ambulatory Visit: Payer: Self-pay

## 2020-02-08 ENCOUNTER — Ambulatory Visit (INDEPENDENT_AMBULATORY_CARE_PROVIDER_SITE_OTHER): Payer: PPO | Admitting: Psychiatry

## 2020-02-08 ENCOUNTER — Other Ambulatory Visit (HOSPITAL_COMMUNITY): Payer: Self-pay | Admitting: Psychiatry

## 2020-02-08 DIAGNOSIS — F4323 Adjustment disorder with mixed anxiety and depressed mood: Secondary | ICD-10-CM

## 2020-02-08 DIAGNOSIS — F0630 Mood disorder due to known physiological condition, unspecified: Secondary | ICD-10-CM

## 2020-02-08 MED ORDER — ESZOPICLONE 3 MG PO TABS: ORAL_TABLET | ORAL | 2 refills | Status: DC

## 2020-02-08 MED ORDER — LORAZEPAM 0.5 MG PO TABS: ORAL_TABLET | ORAL | 5 refills | Status: DC

## 2020-02-08 MED ORDER — CARBAMAZEPINE ER 100 MG PO TB12: ORAL_TABLET | ORAL | 1 refills | Status: DC

## 2020-02-08 MED FILL — carBAMazepine ER 100 MG TB1: 100 | 90 days supply | Qty: 270 | Fill #0

## 2020-02-08 NOTE — Progress Notes (Signed)
Patient ID: William [name], male   DOB: [birth year], 79 y.o.   MRN: [id number] [facility] MD Progress Note  02/08/2020 12:05 PM William [name]  MRN:  [id number] Subjective:  Feeling good Principal Problem: Mild neurocognitive disorder  Today the patient is seen with his wife [name].  His mood is about the same.  He is not persistently depressed.  He described himself as being more emotional.  He feels more fragile.  He has ongoing multiple stressors.  This includes the fact that his wife son refuses to get vaccines yet continues to come visit him.  This individual does not wear masks and it bothers the patient a great deal.  [name] his father as well.  Patient also is very upset with what is happening in [place].  Patient announces that he has been in the [employer] in [employer] and it bothers him a lot.  He is bothered a lot by the pandemic.  From evaluation apparently he did see his neurologist who did not really make any changes or contributions.  Patient also had neuropsych testing which demonstrated no real change in his cognitive status over the last few years.  I shared with him that in some ways this is good news.  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  The patient does not smoke his blood pressure is fairly well controlled.  The patient says he is sleeping and eating fairly well.  He still works out.  Works with a Community education officer.  No longer really has nausea and vomiting that he reported on his last visit.  Past Medical History:  Past Medical History:  Diagnosis Date  . Arthritis    low back  . 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 Bladder stone   . BPH (benign prostatic hyperplasia) 08/06/2007  . Carotid artery occlusion   . 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 Closed fracture of fifth metacarpal bone 05/15/2015  . Eczema   . Fasting hyperglycemia 12/21/2006  . GERD  (gastroesophageal reflux disease)   . History of carotid artery stenosis    S/P BILATERAL CEA  . 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 HTN (hypertension) 07/19/2015  . Hyperlipidemia   . Hypertension   . 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 Nocturia   . Renal insufficiency 06/25/2013  . S/P carotid endarterectomy    BILATERAL ICA--  PATENT PER DUPLEX  05-19-2012  . 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 Urinary frequency   . Vitamin D deficiency     Past Surgical History:  Procedure Laterality Date  . APPENDECTOMY  AS CHILD  . CARDIOVASCULAR STRESS TEST  03-27-2012  DR CRENSHAW   LOW RISK LEXISCAN STUDY-- PROBABLE NORMAL PERFUSION AND SOFT TISSUE ATTENUATION/  NO ISCHEMIA/ EF 51%  . CAROTID ENDARTERECTOMY Bilateral LEFT  11-12-2008  DR GREG HAYES   RIGHT ICA  2006  ([facility])  . CYSTOSCOPY WITH LITHOLAPAXY N/A 02/26/2013   Procedure: CYSTOSCOPY WITH LITHOLAPAXY;  Surgeon: Franchot Gallo, MD;  Location: [facility];  Service: Urology;  Laterality: N/A;  . EYE SURGERY  Jan. 2016   cataract surgery both eyes  . INGUINAL HERNIA REPAIR Right 11-08-2006  . MASS EXCISION N/A 03/03/2016   Procedure: EXCISION OF BACK  MASS;  Surgeon: Stark Klein, MD;  Location: [facility];  Service: General;  Laterality: N/A;  . 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 PROSTATE SURGERY    . TRANSURETHRAL RESECTION OF PROSTATE N/A 02/26/2013   Procedure: TRANSURETHRAL RESECTION OF THE PROSTATE WITH GYRUS INSTRUMENTS;  Surgeon: Franchot Gallo, MD;  Location: [facility];  Service: Urology;  Laterality: N/A;   Family History:  Family History  Problem Relation Age of Onset  . Heart disease Mother        CHF  . Bipolar disorder Mother   . Heart disease Father        CHF   Family Psychiatric  History:  Social History:  Social History   Substance  and Sexual Activity  Alcohol Use Yes  . Alcohol/week: 0.0 standard drinks   Comment: Occasional     Social History   Substance and Sexual Activity  Drug Use No    Social History   Socioeconomic History  . Marital status: Married    Spouse name: Not on file  . Number of children: 2  . Years of education: 18  . Highest education level: High school graduate  Occupational History    Employer: Retired  Tobacco Use  . Smoking status: Former Smoker    Packs/day: 2.00    Years: 40.00    Pack years: 80.00    Types: Cigarettes    Quit date: 02/15/2005    Years since quitting: 14.9  . Smokeless tobacco: Never Used  Vaping Use  . Vaping Use: Never used  Substance and Sexual Activity  . Alcohol use: Yes    Alcohol/week: 0.0 standard drinks    Comment: Occasional  . Drug use: No  . Sexual activity: Yes    Partners: Female  Other Topics Concern  . Not on file  Social History Narrative   Exercise--  Walks dogs everyday      LIves with wife , no stairs in home, caffeine - one cup coffee day, exercise - not much, Right handed, 12th grade, retired      One story home   Social Determinants of Health   Financial Resource Strain:   . Difficulty of Paying Living Expenses: Not on file  Food Insecurity:   . Worried About Charity fundraiser in the Last Year: Not on file  . Ran Out of Food in the Last Year: Not on file  Transportation Needs:   . Lack of Transportation (Medical): Not on file  . Lack of Transportation (Non-Medical): Not on file  Physical Activity:   . Days of Exercise per Week: Not on file  . Minutes of Exercise per Session: Not on file  Stress:   . Feeling of Stress : Not on file  Social Connections:   . Frequency of Communication with Friends and Family: Not on file  . Frequency of Social Gatherings with Friends and Family: Not on file  . Attends Religious Services: Not on file  . Active Member of Clubs or Organizations: Not on file  . Attends Theatre manager Meetings: Not on file  . Marital Status: Not on file   Additional Social History:                         Sleep: Good  Appetite:  Good  Current Medications: Current Outpatient Medications  Medication Sig Dispense Refill  . acetaminophen (TYLENOL) 500 MG tablet Take  500 mg by mouth every 6 (six) hours as needed (1 tab as needed).    [name] amLODipine (NORVASC) 5 MG tablet Take 1 tablet (5 mg total) by mouth daily. 90 tablet 1  . aspirin 325 MG EC tablet Take 325 mg by mouth daily.    . carbamazepine (TEGRETOL-XR) 100 MG 12 hr tablet 1 qam  2  qhs 270 tablet 1  . Cholecalciferol (VITAMIN D) 2000 UNITS CAPS Take by mouth.    . docusate sodium (COLACE) 100 MG capsule Take 100 mg by mouth daily.    . Eszopiclone 3 MG TABS TAKE 1 TABLET BY MOUTH AT BEDTIME AS NEEDED TAKE IMMEDIATELY BEFORE BEDTIME 90 tablet 2  . famotidine (PEPCID) 20 MG tablet Take 20 mg by mouth 2 (two) times daily.    . fenofibrate 160 MG tablet Take 1 tablet (160 mg total) by mouth daily. 90 tablet 1  . Flaxseed, Linseed, (FLAX SEED OIL PO) Take by mouth daily.      . fluticasone (CUTIVATE) 0.05 % cream   3  . lisinopril-hydrochlorothiazide (ZESTORETIC) 10-12.5 MG tablet Take 1 tablet by mouth daily.    [name] LORazepam (ATIVAN) 0.5 MG tablet 2  Bid  1 as needed 150 tablet 5  . Multiple Vitamin (MULTIVITAMIN) tablet Take 1 tablet by mouth daily.    . NONFORMULARY OR COMPOUNDED ITEM [facility]:  Onychomycosis Nail lacquer - Fluconazole 2%, Terbinafine 1%, DMSO, apply to affected area daily. 120 each 2  . NONFORMULARY OR COMPOUNDED ITEM [facility]:  Antifungal topical - Terbinafine 3%, Fluconazole 2%, Tea Tree Oil 5%, Urea 10/%,, Ibuprofen 2% in DMSO Suspension #25ml. Apply to the affected nail(s) once (at bedtime) or twice daily. 100 each 2  . NONFORMULARY OR COMPOUNDED ITEM Compression socks  #1  Dx low ext edema and ache 1 each 0  . ondansetron (ZOFRAN ODT) 4 MG disintegrating tablet Take 1  tablet (4 mg total) by mouth every 8 (eight) hours as needed for nausea or vomiting. 20 tablet 0  . tamsulosin (FLOMAX) 0.4 MG CAPS capsule TAKE 1 CAPSULE BY MOUTH ONCE DAILY 90 capsule 3  . traMADol (ULTRAM) 50 MG tablet Take 1 tablet (50 mg total) by mouth every 8 (eight) hours as needed. 30 tablet 0   No current facility-administered medications for this visit.    Lab Results:  No results found for this or any previous visit (from the past 48 hour(s)).  Physical Findings: AIMS:  , ,  ,  ,    CIWA:    COWS:     Musculoskeletal: Strength & Muscle Tone: within normal limits Gait & Station: normal Patient leans: Right  Psychiatric Specialty Exam: ROS  There were no vitals taken for this visit.There is no height or weight on file to calculate BMI.  General Appearance: Casual  Eye Contact::  Good  Speech:  Clear and Coherent  Volume:  Normal  Mood:  Dysphoric and Euthymic  Affect:  Appropriate  Thought Process:  Coherent  Orientation:  Full (Time, Place, and Person)  Thought Content:  WDL  Suicidal Thoughts:  No  Homicidal Thoughts:  No  Memory:  NA  Judgement:  Good  Insight:  Fair  Psychomotor Activity:  Normal  Concentration:  Good  Recall:  Good  Fund of Knowledge:Good  Language: Good  Akathisia:  No  Handed:  Right  AIMS (if indicated):     Assets:  Desire for Improvement  ADL's:  Intact  Cognition: WNL  Sleep:  Treatment Plan Summary: 02/08/2020, 12:05 PM   It is noted that his last Tegretol level was just above 4.  Today we will go ahead and increase his Tegretol to a dose of 100 mg 1 in the morning and 2 at night.  He was taking 1 twice a day.  He will continue taking Ativan as prescribed.  Patient will continue on his sleeping aid as well.  This patient will have a Tegretol blood level drawn in 1 month.  Receiving this return to see me in 2 months.  He is not suicidal.  He is functioning fairly well and has a very supportive wife [name] who cares a great  deal for him.

## 2020-02-11 ENCOUNTER — Ambulatory Visit: Payer: PPO | Attending: Internal Medicine

## 2020-02-11 DIAGNOSIS — Z23 Encounter for immunization: Secondary | ICD-10-CM

## 2020-02-11 NOTE — Progress Notes (Signed)
   Covid-19 Vaccination Clinic  Name:  William [name]    MRN: [id number] DOB: [birth year]  02/11/2020  [name] was observed post Covid-19 immunization for 15 minutes without incident. He was provided with Vaccine Information Sheet and instruction to access the V-Safe system.   [name] was instructed to call 911 with any severe reactions post vaccine: [name] Difficulty breathing  . Swelling of face and throat  . A fast heartbeat  . A bad rash all over body  . Dizziness and weakness

## 2020-02-14 DIAGNOSIS — N210 Calculus in bladder: Secondary | ICD-10-CM | POA: Diagnosis not present

## 2020-02-14 DIAGNOSIS — D631 Anemia in chronic kidney disease: Secondary | ICD-10-CM | POA: Diagnosis not present

## 2020-02-14 DIAGNOSIS — N183 Chronic kidney disease, stage 3 unspecified: Secondary | ICD-10-CM | POA: Diagnosis not present

## 2020-02-14 DIAGNOSIS — I129 Hypertensive chronic kidney disease with stage 1 through stage 4 chronic kidney disease, or unspecified chronic kidney disease: Secondary | ICD-10-CM | POA: Diagnosis not present

## 2020-02-14 DIAGNOSIS — N400 Enlarged prostate without lower urinary tract symptoms: Secondary | ICD-10-CM | POA: Diagnosis not present

## 2020-02-14 DIAGNOSIS — Z8673 Personal history of transient ischemic attack (TIA), and cerebral infarction without residual deficits: Secondary | ICD-10-CM | POA: Diagnosis not present

## 2020-02-21 ENCOUNTER — Other Ambulatory Visit: Payer: Self-pay

## 2020-02-21 ENCOUNTER — Ambulatory Visit: Payer: PPO | Admitting: Podiatry

## 2020-02-21 DIAGNOSIS — B351 Tinea unguium: Secondary | ICD-10-CM

## 2020-02-21 DIAGNOSIS — M79676 Pain in unspecified toe(s): Secondary | ICD-10-CM

## 2020-02-25 MED FILL — ESZOPICLONE 3 MG TABS: 3 | 90 days supply | Qty: 90 | Fill #0

## 2020-02-28 NOTE — Progress Notes (Signed)
Subjective: 79 y.o. returns the office today for painful, elongated, thickened toenails which he cannot trim himself.  Currently denies any drainage or pus to the toenail sites.  Denies any open lesions.  He has no other concerns today. Denies any systemic complaints such as fevers, chills, nausea, vomiting.   PCP: Carollee Herter, Alferd Apa, DO  Objective: NAD, presents with his wife DP/PT pulses palpable, CRT less than 3 seconds  Nails hypertrophic, dystrophic, elongated, brittle, discolored x 10. There is tenderness overlying the nails 1-5 bilaterally.  On the left hallux toenail appears that there is a new nail growing in which appears to be clear.  Procedure site is healed.  There is no edema, erythema or drainage to the toenail sites today. There is no surrounding erythema or drainage along the nail sites. No open lesions or pre-ulcerative lesions are identified. No pain with calf compression, swelling, warmth, erythema.  Assessment: Patient presents with symptomatic onychomycosis  Plan: -Treatment options including alternatives, risks, complications were discussed -Nails debrided x 10 without any complications or bleeding  Return in about 3 months or sooner if needed  Celesta Gentile, DPM

## 2020-03-10 MED FILL — LISINOPRIL-HCTZ 10-12.5 MG: 10-12.5 | 30 days supply | Qty: 30 | Fill #5

## 2020-03-17 ENCOUNTER — Other Ambulatory Visit (HOSPITAL_COMMUNITY): Payer: Self-pay | Admitting: *Deleted

## 2020-03-17 ENCOUNTER — Telehealth: Payer: Self-pay | Admitting: Pharmacist

## 2020-03-17 DIAGNOSIS — Z79899 Other long term (current) drug therapy: Secondary | ICD-10-CM

## 2020-03-17 MED FILL — FLUAD QUADRIVALENT 0.5 ML P: 0.5 | 1 days supply | Qty: 1 | Fill #0

## 2020-03-17 MED FILL — LORazepam 0.5 MG TABS: 0.5 | 30 days supply | Qty: 150 | Fill #0

## 2020-03-17 MED FILL — FENOFIBRATE 160 MG TABLET: 160 | 90 days supply | Qty: 90 | Fill #1

## 2020-03-17 NOTE — Progress Notes (Addendum)
Chronic Care Management Pharmacy Assistant   Name: William [name]  MRN: [id number] DOB: 02-17-1941  Reason for Encounter: Disease State  Patient Questions:  1.  Have you seen any other providers since your last visit? Yes  2.  Any changes in your medicines or health? Yes   PCP : Ann Held, DO   Their chronic conditions include: HTN, HLD, Hx of Stroke, Anxiety, Osteoarthritis, Insomnia, BPH, GERD, Vitamin D Deficiency.  Office Visits: 01-29-2020 (PCP) Patient presented in the office c/o bilateral leg pain. Per notes; Pt will see Dr Nelva Bush soon and will discuss this and if it may be related to his back. May be from [place]--- will do trial off Zetia and see if it improves  No medication changes. Lab work was ordered  Consults: 02-21-2020 Scientist, forensic) Patient presented in the office as a f/u for symptomatic onychomycosis.Nails debrided x 10 without any complications or bleeding. No medication changes.  02-08-2020 [facility]) Visit with Dr. Casimiro Needle.  01-07-2020 (Ortho) Visit with Dr. Nelva Bush.  11-20-2019 (Podiatry) Patient presented in the office as a f/u for symptomatic onychomycosis.Nails debrided x 10 without any complications or bleeding. No medication changes.  10-01-2019 (Neuro) Patient presented in the office as a f/u for dementia, CKD, HTN, CAD, HLD and hx of CVA. A MRI of lumber spine was ordered for progressive dementia and unsteady gait with back pain. No medication changes indicated.  Allergies:   Allergies  Allergen Reactions  . Other Anaphylaxis  . Strawberry Extract Hives  . Adhesive [Tape] Other (See Comments)    BLISTER  . Latex     Reaction to Plastic Tape  . Statins Other (See Comments)    myalgias  . Strawberry (Diagnostic) Hives    Medications: Outpatient Encounter Medications as of 03/17/2020  Medication Sig Note  . acetaminophen (TYLENOL) 500 MG tablet Take 500 mg by mouth every 6 (six) hours as needed (1 tab as needed).   [name] amLODipine  (NORVASC) 5 MG tablet Take 1 tablet (5 mg total) by mouth daily.   [name] aspirin 325 MG EC tablet Take 325 mg by mouth daily.   . carbamazepine (TEGRETOL-XR) 100 MG 12 hr tablet 1 qam  2  qhs   . Cholecalciferol (VITAMIN D) 2000 UNITS CAPS Take by mouth.   . docusate sodium (COLACE) 100 MG capsule Take 100 mg by mouth daily.   . Eszopiclone 3 MG TABS TAKE 1 TABLET BY MOUTH AT BEDTIME AS NEEDED TAKE IMMEDIATELY BEFORE BEDTIME   . famotidine (PEPCID) 20 MG tablet Take 20 mg by mouth 2 (two) times daily.   . fenofibrate 160 MG tablet Take 1 tablet (160 mg total) by mouth daily.   . Flaxseed, Linseed, (FLAX SEED OIL PO) Take by mouth daily.     . fluticasone (CUTIVATE) 0.05 % cream    . lisinopril-hydrochlorothiazide (ZESTORETIC) 10-12.5 MG tablet Take 1 tablet by mouth daily.   [name] LORazepam (ATIVAN) 0.5 MG tablet 2  Bid  1 as needed   . Multiple Vitamin (MULTIVITAMIN) tablet Take 1 tablet by mouth daily. 07/30/2019: Chewable gummie from [facility]  . NONFORMULARY OR COMPOUNDED [place]:  Onychomycosis Nail lacquer - Fluconazole 2%, Terbinafine 1%, DMSO, apply to affected area daily.   . NONFORMULARY OR COMPOUNDED ITEM [facility]:  Antifungal topical - Terbinafine 3%, Fluconazole 2%, Tea Tree Oil 5%, Urea 10/%,, Ibuprofen 2% in DMSO Suspension #56ml. Apply to the affected nail(s) once (at bedtime) or twice daily.   . NONFORMULARY  OR COMPOUNDED ITEM Compression socks  #1  Dx low ext edema and ache 07/30/2019: [name] reports they have the stockings, but have not needed them  . ondansetron (ZOFRAN ODT) 4 MG disintegrating tablet Take 1 tablet (4 mg total) by mouth every 8 (eight) hours as needed for nausea or vomiting.   . tamsulosin (FLOMAX) 0.4 MG CAPS capsule TAKE 1 CAPSULE BY MOUTH ONCE DAILY   . traMADol (ULTRAM) 50 MG tablet Take 1 tablet (50 mg total) by mouth every 8 (eight) hours as needed.    No facility-administered encounter medications on file as of 03/17/2020.    Current  Diagnosis: Patient Active Problem List   Diagnosis Date Noted  . Depression with anxiety 01/29/2020  . Leg pain, bilateral 01/29/2020  . Ingrown toenail 07/13/2019  . Lumbar spondylosis 05/02/2018  . Pain in left knee 03/09/2018  . 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 Trochanteric bursitis of left hip 01/16/2018  . Preventative health care 09/26/2017  . HTN (hypertension) 07/19/2015  . Dyslipidemia 07/19/2015  . Great toe pain 02/11/2014  . 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 Obesity (BMI 30-39.9) 06/25/2013  . Renal insufficiency 06/25/2013  . Weakness of left arm 06/25/2013  . Sebaceous cyst 03/03/2011  . Sprain of lumbar region 07/31/2010  . Rib pain, left 08/29/2009  . Carotid artery stenosis, bilateral 05/02/2009  . Eczema, atopic 05/31/2008  . Vitamin D deficiency 03/01/2008  . BPH (benign prostatic hyperplasia) 08/06/2007  . Fasting hyperglycemia 12/21/2006  . History of right MCA infarct 2006    Goals Addressed   None    Reviewed chart prior to disease state call. Spoke with patient regarding BP  Recent Office Vitals: BP Readings from Last 3 Encounters:  02/05/20 (!) 145/71  01/29/20 (!) 114/55  09/11/19 137/77   Pulse Readings from Last 3 Encounters:  02/05/20 75  01/29/20 70  07/30/19 67    Wt Readings from Last 3 Encounters:  02/05/20 218 lb (98.9 kg)  01/29/20 216 lb (98 kg)  09/11/19 216 lb 3.2 oz (98.1 kg)     Kidney Function Lab Results  Component Value Date/Time   CREATININE 2.12 (H) 01/29/2020 09:40 AM   CREATININE 1.97 (H) 09/12/2019 09:07 AM   GFR 30.40 (L) 01/29/2020 09:40 AM   GFRNONAA 29 (L) 04/25/2019 10:00 AM   GFRAA 34 (L) 04/25/2019 10:00 AM    BMP Latest Ref Rng & Units 01/29/2020 09/12/2019 07/30/2019  Glucose 70 - 99 mg/dL 108(H) 140(H) 97  BUN 6 - 23 mg/dL 31(H) 35(H) 28(H)  Creatinine 0.40 - 1.50 mg/dL 2.12(H) 1.97(H) 1.77(H)  BUN/Creat Ratio 10 - 24 - - -  Sodium 135 - 145 mEq/L 140 140 140    Potassium 3.5 - 5.1 mEq/L 4.1 3.8 4.1  Chloride 96 - 112 mEq/L 104 103 105  CO2 19 - 32 mEq/L 28 27 26   Calcium 8.4 - 10.5 mg/dL 10.4 9.4 9.3    Contacted patient to complete hypertension disease state call. Patient's wife, [name], stated the patient was laying down and offered to assist with any questions that I had. Informed her the reasoning of my call and she stated they had an appointment with the kidney doctor and was not pleased with the care , so they have not been checking his blood pressure. She also stated she nor her husband were interested in continuing with the program or have any calls from the clinical pharmacist and/or team members. She asked that we no longer contact the patient.  Follow-Up:  Pharmacist Review   Fanny Skates, [place] Pharmacist Assistant [phone]  Noted patient would like to disenroll from CCM services. Happy to help patient in the future if assistance is needed. -FMBB Reviewed by: De Blanch, PharmD Clinical Pharmacist [facility] [phone]

## 2020-03-20 DIAGNOSIS — Z79899 Other long term (current) drug therapy: Secondary | ICD-10-CM | POA: Diagnosis not present

## 2020-03-21 LAB — CARBAMAZEPINE LEVEL, TOTAL: Carbamazepine (Tegretol), S: 5 ug/mL (ref 4.0–12.0)

## 2020-03-26 ENCOUNTER — Encounter: Payer: Self-pay | Admitting: Family Medicine

## 2020-03-27 ENCOUNTER — Other Ambulatory Visit: Payer: Self-pay

## 2020-03-27 DIAGNOSIS — E785 Hyperlipidemia, unspecified: Secondary | ICD-10-CM

## 2020-03-27 NOTE — Telephone Encounter (Signed)
Lets recheck lipid and cmp in 2 months--- if numbers stay we can watch off meds--- if it increases --- refer to lipid clinic

## 2020-03-27 NOTE — Telephone Encounter (Signed)
Please advise 

## 2020-04-08 ENCOUNTER — Other Ambulatory Visit (HOSPITAL_BASED_OUTPATIENT_CLINIC_OR_DEPARTMENT_OTHER): Payer: Self-pay | Admitting: Nephrology

## 2020-04-08 MED FILL — LISINOPRIL-HCTZ 10-12.5 MG: 10-12.5 | 30 days supply | Qty: 30 | Fill #0

## 2020-04-11 ENCOUNTER — Telehealth (HOSPITAL_COMMUNITY): Payer: PPO | Admitting: Psychiatry

## 2020-04-21 MED FILL — TAMSULOSIN HCL 0.4 MG CAP: 0.4 | 90 days supply | Qty: 90 | Fill #1

## 2020-04-21 MED FILL — LORazepam 0.5 MG TABS: 0.5 | 30 days supply | Qty: 150 | Fill #1

## 2020-05-02 MED FILL — AMLODIPINE BESYLATE 5 MG TA: 5 | 90 days supply | Qty: 90 | Fill #1

## 2020-05-02 MED FILL — LISINOPRIL-HCTZ 10-12.5 MG: 10-12.5 | 30 days supply | Qty: 30 | Fill #1

## 2020-05-05 ENCOUNTER — Other Ambulatory Visit: Payer: Self-pay

## 2020-05-05 ENCOUNTER — Ambulatory Visit: Payer: PPO | Admitting: Podiatry

## 2020-05-05 DIAGNOSIS — M79676 Pain in unspecified toe(s): Secondary | ICD-10-CM | POA: Diagnosis not present

## 2020-05-05 DIAGNOSIS — B351 Tinea unguium: Secondary | ICD-10-CM | POA: Diagnosis not present

## 2020-05-05 DIAGNOSIS — N183 Chronic kidney disease, stage 3 unspecified: Secondary | ICD-10-CM | POA: Diagnosis not present

## 2020-05-12 NOTE — Progress Notes (Signed)
Subjective: 79 y.o. returns the office today for painful, elongated, thickened toenails which he cannot trim himself.  Currently denies any drainage or pus to the toenail sites.  Denies any open lesions.  He has no other concerns today. Denies any systemic complaints such as fevers, chills, nausea, vomiting.   PCP: Carollee Herter, Alferd Apa, DO  Objective: NAD, presents with his wife DP/PT pulses palpable, CRT less than 3 seconds  Nails hypertrophic, dystrophic, elongated, brittle, discolored x 10. There is tenderness overlying the nails 1-5 bilaterally. There is no edema, erythema or drainage to the toenail sites today. There is no surrounding erythema or drainage along the nail sites. No open lesions or pre-ulcerative lesions are identified. No pain with calf compression, swelling, warmth, erythema.  Assessment: Patient presents with symptomatic onychomycosis  Plan: -Treatment options including alternatives, risks, complications were discussed -Nails debrided x 10 without any complications or bleeding  Return in about 3 months or sooner if needed  Celesta Gentile, DPM

## 2020-05-14 ENCOUNTER — Other Ambulatory Visit (HOSPITAL_COMMUNITY): Payer: Self-pay | Admitting: Psychiatry

## 2020-05-14 ENCOUNTER — Telehealth (INDEPENDENT_AMBULATORY_CARE_PROVIDER_SITE_OTHER): Payer: PPO | Admitting: Psychiatry

## 2020-05-14 ENCOUNTER — Other Ambulatory Visit: Payer: Self-pay

## 2020-05-14 DIAGNOSIS — F0630 Mood disorder due to known physiological condition, unspecified: Secondary | ICD-10-CM | POA: Diagnosis not present

## 2020-05-14 DIAGNOSIS — F4323 Adjustment disorder with mixed anxiety and depressed mood: Secondary | ICD-10-CM

## 2020-05-14 DIAGNOSIS — E559 Vitamin D deficiency, unspecified: Secondary | ICD-10-CM | POA: Diagnosis not present

## 2020-05-14 DIAGNOSIS — I129 Hypertensive chronic kidney disease with stage 1 through stage 4 chronic kidney disease, or unspecified chronic kidney disease: Secondary | ICD-10-CM | POA: Diagnosis not present

## 2020-05-14 DIAGNOSIS — N210 Calculus in bladder: Secondary | ICD-10-CM | POA: Diagnosis not present

## 2020-05-14 DIAGNOSIS — N183 Chronic kidney disease, stage 3 unspecified: Secondary | ICD-10-CM | POA: Diagnosis not present

## 2020-05-14 DIAGNOSIS — D631 Anemia in chronic kidney disease: Secondary | ICD-10-CM | POA: Diagnosis not present

## 2020-05-14 MED ORDER — ESZOPICLONE 3 MG PO TABS: ORAL_TABLET | ORAL | 2 refills | Status: DC

## 2020-05-14 MED ORDER — CARBAMAZEPINE ER 100 MG PO TB12: ORAL_TABLET | ORAL | 1 refills | Status: DC

## 2020-05-14 MED ORDER — LORAZEPAM 0.5 MG PO TABS: ORAL_TABLET | ORAL | 5 refills | Status: DC

## 2020-05-14 MED FILL — carBAMazepine ER 100 MG TB1: 100 | 90 days supply | Qty: 270 | Fill #0

## 2020-05-14 MED FILL — ESZOPICLONE 3 MG TABS: 3 | 90 days supply | Qty: 90 | Fill #0

## 2020-05-14 MED FILL — AMLODIPINE BESYLATE 10 MG T: 10 | 90 days supply | Qty: 90 | Fill #0

## 2020-05-14 MED FILL — LORazepam 0.5 MG TABS: 0.5 | 30 days supply | Qty: 150 | Fill #0

## 2020-05-14 NOTE — Progress Notes (Signed)
Patient ID: William [name], male   DOB: [birth year], 79 y.o.   MRN: [id number] [facility] MD Progress Note  05/14/2020 3:44 PM William [name]  MRN:  [id number] Subjective:  Feeling good Principal Problem: Mild neurocognitive disorder  Today the patient is seen with his wife [name].  Patient is definitely better.  He is less problem less emotional.  He seems more even.  He had no falls he is not sedated and is functioning fairly well.  He is tolerating his stepson he does not want to get vaccines.  The patient overall seems to be tolerating the stresses of the general better.  He is sleeping well eating well and has fairly good energy.  He has no problems taking concentrating others questions about a slight reduction in his memory.  Nonetheless the increase of Tegretol 200 mg 1 in the morning and 2 at night has been beneficial.  Noted that his Tegretol blood level came back's higher at a value of 5.  Patient has no psychosis.  He is experiencing no irritability or impulsivity.  His wife takes good care of her.  Overall he is positive and optimistic.  He denies any chest pain shortness of breath or any neurological symptoms.  He has moderate renal failure and is recently seen his nephrologist.  He got a good report.  The patient's weight is down just a little bit.  Generally he feels like things are going well.  His wife [name] agrees.  Past Medical History:  Past Medical History:  Diagnosis Date  . Arthritis    low back  . 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 Bladder stone   . BPH (benign prostatic hyperplasia) 08/06/2007  . Carotid artery occlusion   . 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 Closed fracture of fifth metacarpal bone 05/15/2015  . Eczema   . Fasting hyperglycemia 12/21/2006  . GERD (gastroesophageal reflux disease)   . History of carotid artery stenosis    S/P BILATERAL CEA  . 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 HTN (hypertension) 07/19/2015  .  Hyperlipidemia   . Hypertension   . 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 Nocturia   . Renal insufficiency 06/25/2013  . S/P carotid endarterectomy    BILATERAL ICA--  PATENT PER DUPLEX  05-19-2012  . 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 Urinary frequency   . Vitamin D deficiency     Past Surgical History:  Procedure Laterality Date  . APPENDECTOMY  AS CHILD  . CARDIOVASCULAR STRESS TEST  03-27-2012  DR CRENSHAW   LOW RISK LEXISCAN STUDY-- PROBABLE NORMAL PERFUSION AND SOFT TISSUE ATTENUATION/  NO ISCHEMIA/ EF 51%  . CAROTID ENDARTERECTOMY Bilateral LEFT  11-12-2008  DR GREG HAYES   RIGHT ICA  2006  ([facility])  . CYSTOSCOPY WITH LITHOLAPAXY N/A 02/26/2013   Procedure: CYSTOSCOPY WITH LITHOLAPAXY;  Surgeon: Franchot Gallo, MD;  Location: [facility];  Service: Urology;  Laterality: N/A;  . EYE SURGERY  Jan. 2016   cataract surgery both eyes  . INGUINAL HERNIA REPAIR Right 11-08-2006  . MASS EXCISION N/A 03/03/2016   Procedure: EXCISION OF BACK  MASS;  Surgeon: Stark Klein, MD;  Location: [facility];  Service: General;  Laterality: N/A;  . MOHS SURGERY Left 1/ 2016   Dr Nevada Crane-- Basal  cell  . PROSTATE SURGERY    . TRANSURETHRAL RESECTION OF PROSTATE N/A 02/26/2013   Procedure: TRANSURETHRAL RESECTION OF THE PROSTATE WITH GYRUS INSTRUMENTS;  Surgeon: Franchot Gallo, MD;  Location: [facility];  Service: Urology;  Laterality: N/A;   Family History:  Family History  Problem Relation Age of Onset  . Heart disease Mother        CHF  . Bipolar disorder Mother   . Heart disease Father        CHF   Family Psychiatric  History:  Social History:  Social History   Substance and Sexual Activity  Alcohol Use Yes  . Alcohol/week: 0.0 standard drinks   Comment: Occasional     Social History   Substance and Sexual Activity  Drug Use No     Social History   Socioeconomic History  . Marital status: Married    Spouse name: Not on file  . Number of children: 2  . Years of education: 39  . Highest education level: High school graduate  Occupational History    Employer: Retired  Tobacco Use  . Smoking status: Former Smoker    Packs/day: 2.00    Years: 40.00    Pack years: 80.00    Types: Cigarettes    Quit date: 02/15/2005    Years since quitting: 15.2  . Smokeless tobacco: Never Used  Vaping Use  . Vaping Use: Never used  Substance and Sexual Activity  . Alcohol use: Yes    Alcohol/week: 0.0 standard drinks    Comment: Occasional  . Drug use: No  . Sexual activity: Yes    Partners: Female  Other Topics Concern  . Not on file  Social History Narrative   Exercise--  Walks dogs everyday      LIves with wife , no stairs in home, caffeine - one cup coffee day, exercise - not much, Right handed, 12th grade, retired      One story home   Social Determinants of Health   Financial Resource Strain:   . Difficulty of Paying Living Expenses: Not on file  Food Insecurity:   . Worried About Charity fundraiser in the Last Year: Not on file  . Ran Out of Food in the Last Year: Not on file  Transportation Needs:   . Lack of Transportation (Medical): Not on file  . Lack of Transportation (Non-Medical): Not on file  Physical Activity:   . Days of Exercise per Week: Not on file  . Minutes of Exercise per Session: Not on file  Stress:   . Feeling of Stress : Not on file  Social Connections:   . Frequency of Communication with Friends and Family: Not on file  . Frequency of Social Gatherings with Friends and Family: Not on file  . Attends Religious Services: Not on file  . Active Member of Clubs or Organizations: Not on file  . Attends Archivist Meetings: Not on file  . Marital Status: Not on file   Additional Social History:                         Sleep: Good  Appetite:  Good  Current  Medications: Current Outpatient Medications  Medication Sig Dispense Refill  . acetaminophen (TYLENOL) 500 MG tablet Take 500 mg by mouth every 6 (six) hours as needed (1 tab as needed).    [name] amLODipine (NORVASC) 5 MG tablet Take 1 tablet (5 mg total) by  mouth daily. 90 tablet 1  . aspirin 325 MG EC tablet Take 325 mg by mouth daily.    . carbamazepine (TEGRETOL-XR) 100 MG 12 hr tablet 1 qam  2  qhs 270 tablet 1  . Cholecalciferol (VITAMIN D) 2000 UNITS CAPS Take by mouth.    . docusate sodium (COLACE) 100 MG capsule Take 100 mg by mouth daily.    . Eszopiclone 3 MG TABS TAKE 1 TABLET BY MOUTH AT BEDTIME AS NEEDED TAKE IMMEDIATELY BEFORE BEDTIME 90 tablet 2  . famotidine (PEPCID) 20 MG tablet Take 20 mg by mouth 2 (two) times daily.    . fenofibrate 160 MG tablet Take 1 tablet (160 mg total) by mouth daily. 90 tablet 1  . Flaxseed, Linseed, (FLAX SEED OIL PO) Take by mouth daily.      . fluticasone (CUTIVATE) 0.05 % cream   3  . lisinopril-hydrochlorothiazide (ZESTORETIC) 10-12.5 MG tablet Take 1 tablet by mouth daily.    [name] LORazepam (ATIVAN) 0.5 MG tablet 2  Bid  1 as needed 150 tablet 5  . Multiple Vitamin (MULTIVITAMIN) tablet Take 1 tablet by mouth daily.    . NONFORMULARY OR COMPOUNDED ITEM [facility]:  Onychomycosis Nail lacquer - Fluconazole 2%, Terbinafine 1%, DMSO, apply to affected area daily. 120 each 2  . NONFORMULARY OR COMPOUNDED ITEM [facility]:  Antifungal topical - Terbinafine 3%, Fluconazole 2%, Tea Tree Oil 5%, Urea 10/%,, Ibuprofen 2% in DMSO Suspension #64ml. Apply to the affected nail(s) once (at bedtime) or twice daily. 100 each 2  . NONFORMULARY OR COMPOUNDED ITEM Compression socks  #1  Dx low ext edema and ache 1 each 0  . ondansetron (ZOFRAN ODT) 4 MG disintegrating tablet Take 1 tablet (4 mg total) by mouth every 8 (eight) hours as needed for nausea or vomiting. 20 tablet 0  . tamsulosin (FLOMAX) 0.4 MG CAPS capsule TAKE 1 CAPSULE BY MOUTH ONCE DAILY 90  capsule 3  . traMADol (ULTRAM) 50 MG tablet Take 1 tablet (50 mg total) by mouth every 8 (eight) hours as needed. 30 tablet 0   No current facility-administered medications for this visit.    Lab Results:  No results found for this or any previous visit (from the past 48 hour(s)).  Physical Findings: AIMS:  , ,  ,  ,    CIWA:    COWS:     Musculoskeletal: Strength & Muscle Tone: within normal limits Gait & Station: normal Patient leans: Right  Psychiatric Specialty Exam: ROS  There were no vitals taken for this visit.There is no height or weight on file to calculate BMI.  General Appearance: Casual  Eye Contact::  Good  Speech:  Clear and Coherent  Volume:  Normal  Mood:  Dysphoric and Euthymic  Affect:  Appropriate  Thought Process:  Coherent  Orientation:  Full (Time, Place, and Person)  Thought Content:  WDL  Suicidal Thoughts:  No  Homicidal Thoughts:  No  Memory:  NA  Judgement:  Good  Insight:  Fair  Psychomotor Activity:  Normal  Concentration:  Good  Recall:  Good  Fund of Knowledge:Good  Language: Good  Akathisia:  No  Handed:  Right  AIMS (if indicated):     Assets:  Desire for Improvement  ADL's:  Intact  Cognition: WNL  Sleep:      Treatment Plan Summary: 05/14/2020, 3:44 PM   This patient has 3 problems.  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  He takes Tegretol for impulsivity and emotional lability.  It works very well.  He has no nausea or vomiting or any side effects from this medicine.  His second problem is an adjustment disorder with an anxious mood state.  At this time is taking Ativan 0.5 mg 2 in the morning and 2 at night and 1 as needed.  On his next visit we will go ahead and reduce his Ativan probably 2.5 twice daily.  His anxiety is very well controlled.  He is tolerating the world better.  His third problem is that of insomnia.  He takes Lunesta 3 mg.  These 3 medicines Lunesta  Ativan and Tegretol very well for this patient.  Return to see me in 4 months in person.

## 2020-06-03 MED FILL — LISINOPRIL-HCTZ 10-12.5 MG: 10-12.5 | 30 days supply | Qty: 30 | Fill #2

## 2020-06-10 ENCOUNTER — Other Ambulatory Visit: Payer: Self-pay | Admitting: Family Medicine

## 2020-06-10 MED FILL — FENOFIBRATE 160 MG TABLET: 160 | 90 days supply | Qty: 90 | Fill #0

## 2020-07-07 ENCOUNTER — Other Ambulatory Visit: Payer: Self-pay

## 2020-07-07 ENCOUNTER — Ambulatory Visit: Payer: PPO | Admitting: Podiatry

## 2020-07-07 DIAGNOSIS — B351 Tinea unguium: Secondary | ICD-10-CM

## 2020-07-07 DIAGNOSIS — M79676 Pain in unspecified toe(s): Secondary | ICD-10-CM

## 2020-07-08 MED FILL — LISINOPRIL-HCTZ 10-12.5 MG: 10-12.5 | 30 days supply | Qty: 30 | Fill #3

## 2020-07-08 MED FILL — LORAZEPAM 0.5 MG TABS: 0.5 | 30 days supply | Qty: 150 | Fill #1

## 2020-07-09 NOTE — Progress Notes (Signed)
Subjective: 80 y.o. returns the office today for painful, elongated, thickened toenails which he cannot trim himself.  Denies any open lesions.  He has no other concerns today. Denies any systemic complaints such as fevers, chills, nausea, vomiting.   PCP: Carollee Herter, Alferd Apa, DO  Objective: NAD, presents with his wife DP/PT pulses palpable, CRT less than 3 seconds  Nails hypertrophic, dystrophic, elongated, brittle, discolored x 10. There is tenderness overlying the nails 1-5 bilaterally. There is no edema, erythema or drainage to the toenail sites today. There is no surrounding erythema or drainage along the nail sites. Hammertoes present No open lesions or pre-ulcerative lesions are identified. No pain with calf compression, swelling, warmth, erythema.  Assessment: Patient presents with symptomatic onychomycosis  Plan: -Treatment options including alternatives, risks, complications were discussed -Nails debrided x 10 without any complications or bleeding  Return in about 3 months or sooner if needed  Celesta Gentile, DPM

## 2020-07-14 ENCOUNTER — Other Ambulatory Visit: Payer: Self-pay

## 2020-07-14 ENCOUNTER — Ambulatory Visit: Payer: PPO | Attending: Family Medicine | Admitting: Occupational Therapy

## 2020-07-14 ENCOUNTER — Encounter: Payer: Self-pay | Admitting: Occupational Therapy

## 2020-07-14 DIAGNOSIS — R278 Other lack of coordination: Secondary | ICD-10-CM | POA: Diagnosis not present

## 2020-07-14 DIAGNOSIS — R29818 Other symptoms and signs involving the nervous system: Secondary | ICD-10-CM

## 2020-07-14 DIAGNOSIS — I69354 Hemiplegia and hemiparesis following cerebral infarction affecting left non-dominant side: Secondary | ICD-10-CM

## 2020-07-14 DIAGNOSIS — I69318 Other symptoms and signs involving cognitive functions following cerebral infarction: Secondary | ICD-10-CM | POA: Diagnosis not present

## 2020-07-14 NOTE — Therapy (Signed)
[place]. [place], [name], [address] Phone: [phone]   Fax:  [phone]  Occupational Therapy Evaluation  Patient Details  Name: William [name] MRN: [id number] Date of Birth: 10-May-1941 Referring Provider (OT): Roma Schanz, DO (PCP)   Encounter Date: 07/14/2020   OT End of Session - 07/14/20 1305    Visit Number 1    Number of Visits 13    Authorization Type [employer]    Progress Note Due on Visit 10    OT Start Time 1315    OT Stop Time 1359    OT Time Calculation (min) 44 min    Activity Tolerance Patient tolerated treatment well    Behavior During Therapy [facility] for tasks assessed/performed           Past Medical History:  Diagnosis Date  . Arthritis    low back  . 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 Bladder stone   . BPH (benign prostatic hyperplasia) 08/06/2007  . Carotid artery occlusion   . 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 Closed fracture of fifth metacarpal bone 05/15/2015  . Eczema   . Fasting hyperglycemia 12/21/2006  . GERD (gastroesophageal reflux disease)   . History of carotid artery stenosis    S/P BILATERAL CEA  . 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 HTN (hypertension) 07/19/2015  . Hyperlipidemia   . Hypertension   . 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 Nocturia   . Renal insufficiency 06/25/2013  . S/P carotid endarterectomy    BILATERAL ICA--  PATENT PER DUPLEX  05-19-2012  . 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 Urinary frequency   . Vitamin D deficiency     Past Surgical History:  Procedure Laterality Date  . APPENDECTOMY  AS CHILD  . CARDIOVASCULAR STRESS TEST  03-27-2012  DR CRENSHAW   LOW RISK LEXISCAN STUDY-- PROBABLE NORMAL PERFUSION AND SOFT TISSUE ATTENUATION/  NO ISCHEMIA/ EF 51%  . CAROTID  ENDARTERECTOMY Bilateral LEFT  11-12-2008  DR GREG HAYES   RIGHT ICA  2006  ([facility])  . CYSTOSCOPY WITH LITHOLAPAXY N/A 02/26/2013   Procedure: CYSTOSCOPY WITH LITHOLAPAXY;  Surgeon: Franchot Gallo, MD;  Location: [facility];  Service: Urology;  Laterality: N/A;  . EYE SURGERY  Jan. 2016   cataract surgery both eyes  . INGUINAL HERNIA REPAIR Right 11-08-2006  . MASS EXCISION N/A 03/03/2016   Procedure: EXCISION OF BACK  MASS;  Surgeon: Stark Klein, MD;  Location: [facility];  Service: General;  Laterality: N/A;  . 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 PROSTATE SURGERY    . TRANSURETHRAL RESECTION OF PROSTATE N/A 02/26/2013   Procedure: TRANSURETHRAL RESECTION OF THE PROSTATE WITH GYRUS INSTRUMENTS;  Surgeon: Franchot Gallo, MD;  Location: [facility];  Service: Urology;  Laterality: N/A;    There were no vitals filed for this visit.   Subjective Assessment - 07/14/20 1304    Subjective  Pt reports prior CVA sustained in 2006 has led to sustained LUE weakness; "this hand (L) is so much weaker than the other one." Pt also reports difficulty with dressing, particularly LB dressing, and some difficulties with attention. He mentions consistently playing chess virtually with his son and reports  experiencing difficulty remembering whose turn it is or what move he previously made.    Pertinent History R MCA infarct (2006)    Patient Stated Goals Increaing functional use of LUE    Currently in Pain? No/denies             [facility] OT Assessment - 07/14/20 1320      Assessment   Medical Diagnosis LUE weakness    Referring Provider (OT) Roma Schanz, DO (PCP)    Onset Date/Surgical Date --   R MCA infarct in 2006   Hand Dominance Right    Next MD Visit 07/31/20    Prior Therapy PT/OT      Balance Screen   Has the patient fallen in the past 6 months No      Home  Environment   Family/patient expects to be discharged to:  Private residence    Living Arrangements Spouse/significant other   2 dogs     Prior Function   Level of Independence Needs assistance with ADLs    Dressing Minimal    Vocation Retired   Previously a Physiological scientist   Leisure playing chess (plays with son on iPad); golfing      ADL   Eating/Feeding Set up   Sometimes needs assist cutting meat   Upper Body Bathing Set up    [place] up    Upper Body Dressing Moderate assistance;Increased time    Upper Body Dressing Details Pt demonstrated difficulty donning zip-up jacket, requiring verbal cues and Mod A to complete task   Improving participation in dressing is a goal for OT, per pt's wife   Lower Body Dressing Increased time;Moderate assistance   Pt reports his wife helps with threading pants and with socks/shoes   Tub/Shower Transfer Supervision/safety      IADL   Light Housekeeping Performs light daily tasks such as dishwashing, bed making    Meal Prep Able to complete simple cold meal and snack prep;Able to complete simple warm meal prep    Community Mobility Relies on family or friends for transportation    Medication Management Takes responsibility if medication is prepared in advance in seperate dosage    Financial Management Requires assistance      Vision - History   Baseline Vision Wears glasses only for reading      Vision Assessment   Tracking/Visual Pursuits Impaired - to be further tested in functional context   Decreased smoothness in all planes; pt reports no dizziness or diplopia   Visual Fields --   Pt demo'd difficulty maintaining gaze at midline during visual field screening; Will continue to be assessed in functional context     Cognition   Attention Sustained    Sustained Attention Appears intact   Pt able to complete activities during evaluation w/out difficulty, but reports occasional limitations with attention to task at home   Memory Appears intact    Awareness Impaired    Awareness  Impairment Intellectual impairment;Emergent impairment    Problem Solving Impaired    Problem Solving Impairment Functional basic;Functional complex    Bradyphrenia Yes    Cognition Comments Pt demo'd no difficulty with categorization activity. During simple maze activity, pt demo'd difficulty independently finding pathway; required verbal and visual cues to successfully complete maze.      Sensation   Additional Comments Pt reports no difficulty with sensation      Coordination   Coordination and Movement Description Gross and fine motor movement appear fluid;  Will continue to assess in functional context    Right 9 Hole Peg Test 28 sec    Left 9 Hole Peg Test 61 sec    Box and Blocks L hand (20 blocks); R hand (29 blocks)      Praxis   Praxis Impaired    Praxis Impairment Details Motor planning      AROM   Overall AROM  Unable to assess   To be assessed in functional context     Strength   Overall Strength Unable to assess   To be assessed in functional context           OT Education - 07/14/20 1850    Education Details Education provided on role and purpose of OT, as well as potential goals for therapy. Continued functional impact on visuospatial activities and cognition also discussed with pt.    Person(s) Educated Patient;Spouse    Methods Explanation    Comprehension Verbalized understanding;Need further instruction            OT Short Term Goals - 07/14/20 2104      OT SHORT TERM GOAL #1   Title Pt will complete UB dressing with Min A at least 50% of the time    Baseline Mod A    Time 3    Period Weeks    Status New    Target Date 08/08/20      OT SHORT TERM GOAL #2   Title Pt will improve GMC for greater participation in LB dressing as evidenced by increasing Box and Blocks Test by at least 10 blocks    Baseline L hand (20 blocks); R hand (29 blocks)    Time 3    Period Weeks    Status New      OT SHORT TERM GOAL #3   Title Pt will complete simple  tabletop puzzle/maze with Min A at least 50% of the time to improve motor planning    Time 3    Period Weeks    Status New             OT Long Term Goals - 07/14/20 2125      OT LONG TERM GOAL #1   Title Pt will complete HEP designed for strengthening and functional use of LUE with Mod I and report carryover at home    Time 6    Period Weeks    Status New    Target Date 08/29/20      OT LONG TERM GOAL #2   Title Pt will be able to complete LB dressing with Min A at least 50% of the time    Baseline Mod A donning pants    Time 6    Period Weeks    Status New      OT LONG TERM GOAL #3   Title Pt will improve [place] to increase functional use of UE as evidenced by decreasing 9-HPT by at least 10 seconds with L hand    Time 6    Period Weeks    Status New      OT LONG TERM GOAL #4   Title Pt will complete a cognitive-based activity with Min A to improve executive functioning skills and safety during daily activities    Time 6    Period Weeks    Status New            Plan - 07/14/20 1305    Clinical Impression Statement Pt is a 80 y.o.  male who presents to OP OT due to LUE weakness and limitations with functional activities secondary to R MCA infarct sustained in 2006. Pt currently lives with his wife and reports difficulties with dressing and executive functioning tasks. PHMx includes neurocognitive disorder (per physician report, "mild, related to stroke history"), HLD, HTN, GERD, and carotid artery stenosis. Pt will benefit from skilled occupational therapy services to address LUE functional use, gross and fine motor coordination, strength and ROM, visualperceptual deficits, cognition, and compensatory/adaptive strategies in order to improve participation and safety with ADLs and IADLs.    OT Occupational Profile and History Detailed Assessment- Review of Records and additional review of physical, cognitive, psychosocial history related to current functional performance     Occupational performance deficits (Please refer to evaluation for details): ADL's;IADL's;Leisure    Body Structure / Function / Physical Skills ADL;UE functional use;Body mechanics;Pain;FMC;GMC;Coordination;ROM;IADL;Strength;Dexterity    Cognitive Skills Attention;Problem Solve;Safety Awareness    Psychosocial Skills Environmental  Adaptations    Rehab Potential Good    Clinical Decision Making Multiple treatment options, significant modification of task necessary    Comorbidities Affecting Occupational Performance: May have comorbidities impacting occupational performance    Modification or Assistance to Complete Evaluation  Min-Moderate modification of tasks or assist with assess necessary to complete eval    OT Frequency 2x / week    OT Duration 6 weeks    OT Treatment/Interventions Self-care/ADL training;Visual/perceptual remediation/compensation;Patient/family education;Passive range of motion;Cryotherapy;Electrical Stimulation;Contrast Bath;Coping strategies training;Moist Heat;Therapeutic exercise;Manual Therapy;Therapeutic activities;Neuromuscular education;Cognitive remediation/compensation    Plan visualperceptual activities; adaptive dressing techniques    Consulted and Agree with Plan of Care Patient;Family member/caregiver    Family Member Consulted Wife           Patient will benefit from skilled therapeutic intervention in order to improve the following deficits and impairments:   Body Structure / Function / Physical Skills: ADL,UE functional use,Body mechanics,Pain,FMC,GMC,Coordination,ROM,IADL,Strength,Dexterity Cognitive Skills: Attention,Problem Solve,Safety Awareness Psychosocial Skills: Environmental  Adaptations   Visit Diagnosis: Hemiplegia and hemiparesis following cerebral infarction affecting left non-dominant side (HCC)  Other lack of coordination  Other symptoms and signs involving cognitive functions following cerebral infarction  Other symptoms and signs  involving the nervous system    Problem List Patient Active Problem List   Diagnosis Date Noted  . Depression with anxiety 01/29/2020  . Leg pain, bilateral 01/29/2020  . Ingrown toenail 07/13/2019  . Lumbar spondylosis 05/02/2018  . Pain in left knee 03/09/2018  . 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 Trochanteric bursitis of left hip 01/16/2018  . Preventative health care 09/26/2017  . HTN (hypertension) 07/19/2015  . Dyslipidemia 07/19/2015  . Great toe pain 02/11/2014  . 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 Obesity (BMI 30-39.9) 06/25/2013  . Renal insufficiency 06/25/2013  . Weakness of left arm 06/25/2013  . Sebaceous cyst 03/03/2011  . Sprain of lumbar region 07/31/2010  . Rib pain, left 08/29/2009  . Carotid artery stenosis, bilateral 05/02/2009  . Eczema, atopic 05/31/2008  . Vitamin D deficiency 03/01/2008  . BPH (benign prostatic hyperplasia) 08/06/2007  . Fasting hyperglycemia 12/21/2006  . History of right MCA infarct [address], OTR/L, MSOT 07/14/2020, 9:49 PM  [place]. [place], [name], [address] Phone: [phone]   Fax:  [phone]  Name: William [name] MRN: [id number] Date of Birth: 1941/02/07

## 2020-07-16 ENCOUNTER — Encounter: Payer: Self-pay | Admitting: Occupational Therapy

## 2020-07-16 ENCOUNTER — Other Ambulatory Visit: Payer: Self-pay

## 2020-07-16 ENCOUNTER — Ambulatory Visit: Payer: PPO | Attending: Family Medicine | Admitting: Occupational Therapy

## 2020-07-16 DIAGNOSIS — R278 Other lack of coordination: Secondary | ICD-10-CM

## 2020-07-16 DIAGNOSIS — R29818 Other symptoms and signs involving the nervous system: Secondary | ICD-10-CM

## 2020-07-16 DIAGNOSIS — I69318 Other symptoms and signs involving cognitive functions following cerebral infarction: Secondary | ICD-10-CM

## 2020-07-16 DIAGNOSIS — I69354 Hemiplegia and hemiparesis following cerebral infarction affecting left non-dominant side: Secondary | ICD-10-CM

## 2020-07-16 NOTE — Therapy (Signed)
[place]. [place], [name], [address] Phone: [phone]   Fax:  [phone]  Occupational Therapy Treatment  Patient Details  Name: William [name] MRN: [id number] Date of Birth: 02/23/1941 Referring Provider (OT): Roma Schanz, DO (PCP)   Encounter Date: 07/16/2020   OT End of Session - 07/16/20 1337    Visit Number 2    Number of Visits 13    Authorization Type [employer]    Progress Note Due on Visit 10    OT Start Time 1314    OT Stop Time 1400    OT Time Calculation (min) 46 min    Activity Tolerance Patient tolerated treatment well    Behavior During Therapy [facility] for tasks assessed/performed           Past Medical History:  Diagnosis Date  . Arthritis    low back  . 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 Bladder stone   . BPH (benign prostatic hyperplasia) 08/06/2007  . Carotid artery occlusion   . 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 Closed fracture of fifth metacarpal bone 05/15/2015  . Eczema   . Fasting hyperglycemia 12/21/2006  . GERD (gastroesophageal reflux disease)   . History of carotid artery stenosis    S/P BILATERAL CEA  . 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 HTN (hypertension) 07/19/2015  . Hyperlipidemia   . Hypertension   . 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 Nocturia   . Renal insufficiency 06/25/2013  . S/P carotid endarterectomy    BILATERAL ICA--  PATENT PER DUPLEX  05-19-2012  . 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 Urinary frequency   . Vitamin D deficiency     Past Surgical History:  Procedure Laterality Date  . APPENDECTOMY  AS CHILD  . CARDIOVASCULAR STRESS TEST  03-27-2012  DR CRENSHAW   LOW RISK LEXISCAN STUDY-- PROBABLE NORMAL PERFUSION AND SOFT TISSUE ATTENUATION/  NO ISCHEMIA/ EF 51%  . CAROTID  ENDARTERECTOMY Bilateral LEFT  11-12-2008  DR GREG HAYES   RIGHT ICA  2006  ([facility])  . CYSTOSCOPY WITH LITHOLAPAXY N/A 02/26/2013   Procedure: CYSTOSCOPY WITH LITHOLAPAXY;  Surgeon: Franchot Gallo, MD;  Location: [facility];  Service: Urology;  Laterality: N/A;  . EYE SURGERY  Jan. 2016   cataract surgery both eyes  . INGUINAL HERNIA REPAIR Right 11-08-2006  . MASS EXCISION N/A 03/03/2016   Procedure: EXCISION OF BACK  MASS;  Surgeon: Stark Klein, MD;  Location: [place];  Service: General;  Laterality: N/A;  . 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 PROSTATE SURGERY    . TRANSURETHRAL RESECTION OF PROSTATE N/A 02/26/2013   Procedure: TRANSURETHRAL RESECTION OF THE PROSTATE WITH GYRUS INSTRUMENTS;  Surgeon: Franchot Gallo, MD;  Location: [facility];  Service: Urology;  Laterality: N/A;    There were no vitals filed for this visit.   Subjective Assessment - 07/16/20 1316    Subjective  Pt reports difficulty with money management and that that is something he would like to work on as well. Pt also mentioned that he has "anger issues," reporting that he often has a low frustration tolerance.    Pertinent History R MCA infarct (2006)    Patient  Stated Goals Increaing functional use of LUE    Currently in Pain? No/denies            OT Treatments/Exercises (OP) - 07/16/20 1420      ADLs   UB Dressing Pt Mod A to don zip-up jacket, requiring min v/c to remember to thread affected side first      Theraputty   Theraputty - Roll Rolling yellow putty out with both hands flat   pt demo'd some difficulty applying similar pressure with L hand compared with R hand   Theraputty - Grip Pinch-and-pull with yellow putty completed 5x   pt required significant v/c to return attention to task. Occ verbal and visual cues required to pull with L hand as much as left; after initial cueing, pt was able to self-correct     Visual/Perceptual  Exercises   Other Tanagrams; pt unable to copy shapes without mod cueing    Scanning - Tabletop Pt completed number cancellation activity, finding 13/16 targets; 3 targets that were missed were all located on far lefthand side      Fine Motor Coordination (Hand/Wrist)   Large Pegboard Placed pegs in semi-circle; pt instructed to push them in with L hand or using mallet while supporting peg with R hand   pt demo'd decreased frustration tolerance during activity   Dealing card with thumb Stabilizing deck with both hands and using using L thumb to push cards off one at a time            OT Education - 07/16/20 1433    Education Details OT discussed results of tabletop visual scanning activity, demonstrating that pt missed targets consistently on L side. OT introduced visual compensatory strategies and reviewed them with pt at the end of the session.    Person(s) Educated Patient;Spouse    Methods Explanation;Demonstration    Comprehension Verbalized understanding;Need further instruction            OT Short Term Goals - 07/16/20 1407      OT SHORT TERM GOAL #1   Title Pt will complete UB dressing with Min A at least 50% of the time    Baseline Mod A    Time 3    Period Weeks    Status On-going    Target Date 08/08/20      OT SHORT TERM GOAL #2   Title Pt will improve GMC for greater participation in LB dressing as evidenced by increasing Box and Blocks Test by at least 10 blocks    Baseline L hand (20 blocks); R hand (29 blocks)    Time 3    Period Weeks    Status On-going      OT SHORT TERM GOAL #3   Title Pt will complete simple tabletop puzzle/maze with Min A at least 50% of the time to improve motor planning    Time 3    Period Weeks    Status On-going            OT Long Term Goals - 07/16/20 1411      OT LONG TERM GOAL #1   Title Pt will complete HEP designed for strengthening and functional use of LUE with Mod I and report carryover at home    Time 6    Period  Weeks    Status On-going      OT LONG TERM GOAL #2   Title Pt will be able to complete LB dressing with Min A at least 50%  of the time    Baseline Mod A donning pants    Time 6    Period Weeks    Status On-going      OT LONG TERM GOAL #3   Title Pt will improve [place] to increase functional use of UE as evidenced by decreasing 9-HPT by at least 10 seconds with L hand    Time 6    Period Weeks    Status On-going      OT LONG TERM GOAL #4   Title Pt will complete a cognitive-based activity with Min A to improve executive functioning skills and safety during daily activities    Time 6    Period Weeks    Status On-going            Plan - 07/16/20 1342    Clinical Impression Statement Pt tolerated therapeutic activities well and did demo decreased frustration tolerance when activities became challenging, but was able to be re-directed easily. Visual-perceptual activities continue to be challenging and pt was receptive to visual compensation strategies.    OT Occupational Profile and History Detailed Assessment- Review of Records and additional review of physical, cognitive, psychosocial history related to current functional performance    Occupational performance deficits (Please refer to evaluation for details): ADL's;IADL's;Leisure    Body Structure / Function / Physical Skills ADL;UE functional use;Body mechanics;Pain;FMC;GMC;Coordination;ROM;IADL;Strength;Dexterity    Cognitive Skills Attention;Problem Solve;Safety Awareness    Psychosocial Skills Environmental  Adaptations    Rehab Potential Good    Clinical Decision Making Multiple treatment options, significant modification of task necessary    Comorbidities Affecting Occupational Performance: May have comorbidities impacting occupational performance    Modification or Assistance to Complete Evaluation  Min-Moderate modification of tasks or assist with assess necessary to complete eval    OT Frequency 2x / week    OT Duration 6  weeks    OT Treatment/Interventions Self-care/ADL training;Visual/perceptual remediation/compensation;Patient/family education;Passive range of motion;Cryotherapy;Electrical Stimulation;Contrast Bath;Coping strategies training;Moist Heat;Therapeutic exercise;Manual Therapy;Therapeutic activities;Neuromuscular education;Cognitive remediation/compensation    Plan money management, continue hand strengthening and coordination activities, dressing    Consulted and Agree with Plan of Care Patient;Family member/caregiver    Family Member Consulted Wife [name])           Patient will benefit from skilled therapeutic intervention in order to improve the following deficits and impairments:   Body Structure / Function / Physical Skills: ADL,UE functional use,Body mechanics,Pain,FMC,GMC,Coordination,ROM,IADL,Strength,Dexterity Cognitive Skills: Attention,Problem Solve,Safety Awareness Psychosocial Skills: Environmental  Adaptations   Visit Diagnosis: Hemiplegia and hemiparesis following cerebral infarction affecting left non-dominant side (HCC)  Other lack of coordination  Other symptoms and signs involving cognitive functions following cerebral infarction  Other symptoms and signs involving the nervous system    Problem List Patient Active Problem List   Diagnosis Date Noted  . Depression with anxiety 01/29/2020  . Leg pain, bilateral 01/29/2020  . Ingrown toenail 07/13/2019  . Lumbar spondylosis 05/02/2018  . Pain in left knee 03/09/2018  . 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 Trochanteric bursitis of left hip 01/16/2018  . Preventative health care 09/26/2017  . HTN (hypertension) 07/19/2015  . Dyslipidemia 07/19/2015  . Great toe pain 02/11/2014  . 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 Obesity (BMI 30-39.9) 06/25/2013  . Renal insufficiency 06/25/2013  . Weakness of left arm 06/25/2013  . Sebaceous cyst 03/03/2011  . Sprain of lumbar region 07/31/2010  . Rib  pain, left 08/29/2009  . Carotid artery stenosis, bilateral 05/02/2009  . Eczema, atopic 05/31/2008  .  Vitamin D deficiency 03/01/2008  . BPH (benign prostatic hyperplasia) 08/06/2007  . Fasting hyperglycemia 12/21/2006  . History of right MCA infarct [address] OTR/L, MSOT 07/16/2020, 4:41 PM  [place]. [place], [name], [address] Phone: [phone]   Fax:  [phone]  Name: William [name] MRN: [id number] Date of Birth: [birth year]

## 2020-07-21 ENCOUNTER — Encounter: Payer: Self-pay | Admitting: Occupational Therapy

## 2020-07-21 ENCOUNTER — Other Ambulatory Visit: Payer: Self-pay

## 2020-07-21 ENCOUNTER — Ambulatory Visit: Payer: PPO | Admitting: Occupational Therapy

## 2020-07-21 DIAGNOSIS — R29818 Other symptoms and signs involving the nervous system: Secondary | ICD-10-CM

## 2020-07-21 DIAGNOSIS — I69354 Hemiplegia and hemiparesis following cerebral infarction affecting left non-dominant side: Secondary | ICD-10-CM

## 2020-07-21 DIAGNOSIS — I69318 Other symptoms and signs involving cognitive functions following cerebral infarction: Secondary | ICD-10-CM

## 2020-07-21 DIAGNOSIS — R278 Other lack of coordination: Secondary | ICD-10-CM

## 2020-07-21 NOTE — Therapy (Signed)
[place] [place], [name], [address] Phone: [phone]   Fax:  [phone]  Occupational Therapy Treatment  Patient Details  Name: William [name] MRN: [id number] Date of Birth: 1940/07/03 Referring Provider (OT): Roma Schanz, DO (PCP)   Encounter Date: 07/21/2020   OT End of Session - 07/21/20 1201    Visit Number 3    Number of Visits 13    Authorization Type [employer]    Progress Note Due on Visit 10    OT Start Time 1101    OT Stop Time 1146    OT Time Calculation (min) 45 min    Activity Tolerance Patient tolerated treatment well    Behavior During Therapy [facility] for tasks assessed/performed           Past Medical History:  Diagnosis Date  . Arthritis    low back  . 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 Bladder stone   . BPH (benign prostatic hyperplasia) 08/06/2007  . Carotid artery occlusion   . 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 Closed fracture of fifth metacarpal bone 05/15/2015  . Eczema   . Fasting hyperglycemia 12/21/2006  . GERD (gastroesophageal reflux disease)   . History of carotid artery stenosis    S/P BILATERAL CEA  . 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 HTN (hypertension) 07/19/2015  . Hyperlipidemia   . Hypertension   . 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 Nocturia   . Renal insufficiency 06/25/2013  . S/P carotid endarterectomy    BILATERAL ICA--  PATENT PER DUPLEX  05-19-2012  . 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 Urinary frequency   . Vitamin D deficiency     Past Surgical History:  Procedure Laterality Date  . APPENDECTOMY  AS CHILD  . CARDIOVASCULAR STRESS TEST  03-27-2012  DR CRENSHAW   LOW RISK LEXISCAN STUDY-- PROBABLE NORMAL PERFUSION AND SOFT TISSUE ATTENUATION/  NO ISCHEMIA/ EF 51%  . CAROTID  ENDARTERECTOMY Bilateral LEFT  11-12-2008  DR GREG HAYES   RIGHT ICA  2006  ([facility])  . CYSTOSCOPY WITH LITHOLAPAXY N/A 02/26/2013   Procedure: CYSTOSCOPY WITH LITHOLAPAXY;  Surgeon: Franchot Gallo, MD;  Location: [facility];  Service: Urology;  Laterality: N/A;  . EYE SURGERY  Jan. 2016   cataract surgery both eyes  . INGUINAL HERNIA REPAIR Right 11-08-2006  . MASS EXCISION N/A 03/03/2016   Procedure: EXCISION OF BACK  MASS;  Surgeon: Stark Klein, MD;  Location: [facility];  Service: General;  Laterality: N/A;  . 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 PROSTATE SURGERY    . TRANSURETHRAL RESECTION OF PROSTATE N/A 02/26/2013   Procedure: TRANSURETHRAL RESECTION OF THE PROSTATE WITH GYRUS INSTRUMENTS;  Surgeon: Franchot Gallo, MD;  Location: [facility];  Service: Urology;  Laterality: N/A;    There were no vitals filed for this visit.   Subjective Assessment - 07/21/20 1103    Subjective  "I had a rough night last night and didn't get much sleep." Pt reports having an upset stomach last night and not sleeping well.    Pertinent History R MCA infarct (2006)    Patient Stated Goals Increaing functional use of LUE    Currently in  Pain? No/denies            OT Treatments/Exercises (OP) - 07/21/20 1144      Cognitive Exercises   Financial Management Basic Pt completed basic functional money exchange worksheet. Mod v/c required to correct errors throughout   Pt demo'd decreased sustained attention and problem-solving toward end of worksheet, requiring increased cueing and correction of errors   Other Cognitive Exercises 1 Connect 4 used to facilitate problem-solving, attention to task, and working memory; pt improved with increased repetition, but required min verbal and visual cues to follow progress of game and see available moves   Pt instructed to use L hand throughout activity     Neurological Re-education Exercises    Theraputty - Grip Full grasp with medium-soft hand exerciser completed 10x2   Pt required v/c to sustain attention to task           OT Education - 07/21/20 1200    Education Details Education provided on alternative UB dressing method due to spouse report that pt continues to have difficulty with completing UB dressing at home.    Person(s) Educated Patient;Spouse    Methods Explanation;Demonstration    Comprehension Verbalized understanding;Need further instruction            OT Short Term Goals - 07/16/20 1407      OT SHORT TERM GOAL #1   Title Pt will complete UB dressing with Min A at least 50% of the time    Baseline Mod A    Time 3    Period Weeks    Status On-going    Target Date 08/08/20      OT SHORT TERM GOAL #2   Title Pt will improve GMC for greater participation in LB dressing as evidenced by increasing Box and Blocks Test by at least 10 blocks    Baseline L hand (20 blocks); R hand (29 blocks)    Time 3    Period Weeks    Status On-going      OT SHORT TERM GOAL #3   Title Pt will complete simple tabletop puzzle/maze with Min A at least 50% of the time to improve motor planning    Time 3    Period Weeks    Status On-going             OT Long Term Goals - 07/16/20 1411      OT LONG TERM GOAL #1   Title Pt will complete HEP designed for strengthening and functional use of LUE with Mod I and report carryover at home    Time 6    Period Weeks    Status On-going      OT LONG TERM GOAL #2   Title Pt will be able to complete LB dressing with Min A at least 50% of the time    Baseline Mod A donning pants    Time 6    Period Weeks    Status On-going      OT LONG TERM GOAL #3   Title Pt will improve [place] to increase functional use of UE as evidenced by decreasing 9-HPT by at least 10 seconds with L hand    Time 6    Period Weeks    Status On-going      OT LONG TERM GOAL #4   Title Pt will complete a cognitive-based activity with Min A to improve  executive functioning skills and safety during daily activities    Time 6  Period Weeks    Status On-going            Plan - 07/21/20 1201    Clinical Impression Statement Pt demo'd decreased problem-solving, particularly when fatigued toward conclusion of therapeutic activities. OT continuing to provide cues and cognitive strategies to be used at home. Functional use of LUE appears to improve with repetition.    OT Occupational Profile and History Detailed Assessment- Review of Records and additional review of physical, cognitive, psychosocial history related to current functional performance    Occupational performance deficits (Please refer to evaluation for details): ADL's;IADL's;Leisure    Body Structure / Function / Physical Skills ADL;UE functional use;Body mechanics;Pain;FMC;GMC;Coordination;ROM;IADL;Strength;Dexterity    Cognitive Skills Attention;Problem Solve;Safety Awareness    Psychosocial Skills Environmental  Adaptations    Rehab Potential Good    Clinical Decision Making Multiple treatment options, significant modification of task necessary    Comorbidities Affecting Occupational Performance: May have comorbidities impacting occupational performance    Modification or Assistance to Complete Evaluation  Min-Moderate modification of tasks or assist with assess necessary to complete eval    OT Frequency 2x / week    OT Duration 6 weeks    OT Treatment/Interventions Self-care/ADL training;Visual/perceptual remediation/compensation;Patient/family education;Passive range of motion;Cryotherapy;Electrical Stimulation;Contrast Bath;Coping strategies training;Moist Heat;Therapeutic exercise;Manual Therapy;Therapeutic activities;Neuromuscular education;Cognitive remediation/compensation    Plan Compensatory strategies for dressing    Consulted and Agree with Plan of Care Patient;Family member/caregiver    Family Member Consulted Wife [name])           Patient will benefit from  skilled therapeutic intervention in order to improve the following deficits and impairments:   Body Structure / Function / Physical Skills: ADL,UE functional use,Body mechanics,Pain,FMC,GMC,Coordination,ROM,IADL,Strength,Dexterity Cognitive Skills: Attention,Problem Solve,Safety Awareness Psychosocial Skills: Environmental  Adaptations   Visit Diagnosis: Hemiplegia and hemiparesis following cerebral infarction affecting left non-dominant side (HCC)  Other lack of coordination  Other symptoms and signs involving cognitive functions following cerebral infarction  Other symptoms and signs involving the nervous system    Problem List Patient Active Problem List   Diagnosis Date Noted  . Depression with anxiety 01/29/2020  . Leg pain, bilateral 01/29/2020  . Ingrown toenail 07/13/2019  . Lumbar spondylosis 05/02/2018  . Pain in left knee 03/09/2018  . 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 Trochanteric bursitis of left hip 01/16/2018  . Preventative health care 09/26/2017  . HTN (hypertension) 07/19/2015  . Dyslipidemia 07/19/2015  . Great toe pain 02/11/2014  . 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 Obesity (BMI 30-39.9) 06/25/2013  . Renal insufficiency 06/25/2013  . Weakness of left arm 06/25/2013  . Sebaceous cyst 03/03/2011  . Sprain of lumbar region 07/31/2010  . Rib pain, left 08/29/2009  . Carotid artery stenosis, bilateral 05/02/2009  . Eczema, atopic 05/31/2008  . Vitamin D deficiency 03/01/2008  . BPH (benign prostatic hyperplasia) 08/06/2007  . Fasting hyperglycemia 12/21/2006  . History of right MCA infarct [address] OTR/L, MSOT 07/21/2020, 12:04 PM  [place]. [place], [name], [address] Phone: [phone]   Fax:  [phone]  Name: JKAI ARWOOD MRN: [id number] Date of Birth: 20-Nov-1940

## 2020-07-22 MED FILL — TAMSULOSIN HCL 0.4 MG CAP: 0.4 | 90 days supply | Qty: 90 | Fill #2

## 2020-07-23 ENCOUNTER — Ambulatory Visit: Payer: PPO | Admitting: Occupational Therapy

## 2020-07-23 ENCOUNTER — Other Ambulatory Visit: Payer: Self-pay

## 2020-07-23 ENCOUNTER — Encounter: Payer: Self-pay | Admitting: Occupational Therapy

## 2020-07-23 DIAGNOSIS — I69318 Other symptoms and signs involving cognitive functions following cerebral infarction: Secondary | ICD-10-CM

## 2020-07-23 DIAGNOSIS — I69354 Hemiplegia and hemiparesis following cerebral infarction affecting left non-dominant side: Secondary | ICD-10-CM | POA: Diagnosis not present

## 2020-07-23 DIAGNOSIS — R29818 Other symptoms and signs involving the nervous system: Secondary | ICD-10-CM

## 2020-07-23 DIAGNOSIS — R278 Other lack of coordination: Secondary | ICD-10-CM

## 2020-07-23 NOTE — Therapy (Signed)
[place]. [place], [name], [address] Phone: [phone]   Fax:  [phone]  Occupational Therapy Treatment  Patient Details  Name: William [name] MRN: [id number] Date of Birth: 07-31-1940 Referring Provider (OT): Roma Schanz, DO (PCP)   Encounter Date: 07/23/2020   OT End of Session - 07/23/20 1644    Visit Number 4    Number of Visits 13    Authorization Type [employer]    Progress Note Due on Visit 10    OT Start Time 1445    OT Stop Time 1529    OT Time Calculation (min) 44 min    Activity Tolerance Patient tolerated treatment well    Behavior During Therapy [facility] for tasks assessed/performed           Past Medical History:  Diagnosis Date  . Arthritis    low back  . 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 Bladder stone   . BPH (benign prostatic hyperplasia) 08/06/2007  . Carotid artery occlusion   . 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 Closed fracture of fifth metacarpal bone 05/15/2015  . Eczema   . Fasting hyperglycemia 12/21/2006  . GERD (gastroesophageal reflux disease)   . History of carotid artery stenosis    S/P BILATERAL CEA  . 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 HTN (hypertension) 07/19/2015  . Hyperlipidemia   . Hypertension   . 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 Nocturia   . Renal insufficiency 06/25/2013  . S/P carotid endarterectomy    BILATERAL ICA--  PATENT PER DUPLEX  05-19-2012  . 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 Urinary frequency   . Vitamin D deficiency     Past Surgical History:  Procedure Laterality Date  . APPENDECTOMY  AS CHILD  . CARDIOVASCULAR STRESS TEST  03-27-2012  DR CRENSHAW   LOW RISK LEXISCAN STUDY-- PROBABLE NORMAL PERFUSION AND SOFT TISSUE ATTENUATION/  NO ISCHEMIA/ EF 51%  . CAROTID  ENDARTERECTOMY Bilateral LEFT  11-12-2008  DR GREG HAYES   RIGHT ICA  2006  ([facility])  . CYSTOSCOPY WITH LITHOLAPAXY N/A 02/26/2013   Procedure: CYSTOSCOPY WITH LITHOLAPAXY;  Surgeon: Franchot Gallo, MD;  Location: [facility];  Service: Urology;  Laterality: N/A;  . EYE SURGERY  Jan. 2016   cataract surgery both eyes  . INGUINAL HERNIA REPAIR Right 11-08-2006  . MASS EXCISION N/A 03/03/2016   Procedure: EXCISION OF BACK  MASS;  Surgeon: Stark Klein, MD;  Location: [place];  Service: General;  Laterality: N/A;  . 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 PROSTATE SURGERY    . TRANSURETHRAL RESECTION OF PROSTATE N/A 02/26/2013   Procedure: TRANSURETHRAL RESECTION OF THE PROSTATE WITH GYRUS INSTRUMENTS;  Surgeon: Franchot Gallo, MD;  Location: [facility];  Service: Urology;  Laterality: N/A;    There were no vitals filed for this visit.   Subjective Assessment - 07/23/20 1445    Subjective  Pt reports he is feeling good and not in any pain today.    Pertinent History R MCA infarct (2006)    Patient Stated Goals Increaing functional use of LUE    Currently in Pain? No/denies  OT Treatments/Exercises (OP) - 07/23/20 1524      ADLs   UB Dressing Donning/doffing button-up shirt; Min A for doffing/Mod A for donning. After problem-solving pt's preferred method, OT wrote out simple 4-step instructions for both donning and doffing and had pt practice with decreased cueing from OT.   Reviewed cueing sheet and strategies implementend during session with wife at end of session     Fine Motor Coordination (Hand/Wrist)   Manipulation of small objects Threading beads onto string for bilateral coordination and [place]   Able to string 10/10 w/out difficulty; Min v/c to return attention to task            OT Short Term Goals - 07/16/20 1407      OT [place] #1   Title Pt will complete UB dressing with Min A at  least 50% of the time    Baseline Mod A    Time 3    Period Weeks    Status On-going    Target Date 08/08/20      OT SHORT TERM GOAL #2   Title Pt will improve GMC for greater participation in LB dressing as evidenced by increasing Box and Blocks Test by at least 10 blocks    Baseline L hand (20 blocks); R hand (29 blocks)    Time 3    Period Weeks    Status On-going      OT SHORT TERM GOAL #3   Title Pt will complete simple tabletop puzzle/maze with Min A at least 50% of the time to improve motor planning    Time 3    Period Weeks    Status On-going             OT Long Term Goals - 07/16/20 1411      OT LONG TERM GOAL #1   Title Pt will complete HEP designed for strengthening and functional use of LUE with Mod I and report carryover at home    Time 6    Period Weeks    Status On-going      OT LONG TERM GOAL #2   Title Pt will be able to complete LB dressing with Min A at least 50% of the time    Baseline Mod A donning pants    Time 6    Period Weeks    Status On-going      OT LONG TERM GOAL #3   Title Pt will improve [place] to increase functional use of UE as evidenced by decreasing 9-HPT by at least 10 seconds with L hand    Time 6    Period Weeks    Status On-going      OT LONG TERM GOAL #4   Title Pt will complete a cognitive-based activity with Min A to improve executive functioning skills and safety during daily activities    Time 6    Period Weeks    Status On-going            Plan - 07/23/20 1645    Clinical Impression Statement Decreased sustained attention and functional problem-solving continue to be limiting for ADLs. OT developed cueing sheet for pt to incorporate into dressing routine at home and reviewed strategies with pt's wife. Pt able to complete UB dressing of button-up shirt with minimal v/c and extra time and was able to independently unbutton and button shirt after OT aligned starting button.    OT Occupational Profile and History Detailed  Assessment- Review of  Records and additional review of physical, cognitive, psychosocial history related to current functional performance    Occupational performance deficits (Please refer to evaluation for details): ADL's;IADL's;Leisure    Body Structure / Function / Physical Skills ADL;UE functional use;Body mechanics;Pain;FMC;GMC;Coordination;ROM;IADL;Strength;Dexterity    Cognitive Skills Attention;Problem Solve;Safety Awareness    Psychosocial Skills Environmental  Adaptations    Rehab Potential Good    Clinical Decision Making Multiple treatment options, significant modification of task necessary    Comorbidities Affecting Occupational Performance: May have comorbidities impacting occupational performance    Modification or Assistance to Complete Evaluation  Min-Moderate modification of tasks or assist with assess necessary to complete eval    OT Frequency 2x / week    OT Duration 6 weeks    OT Treatment/Interventions Self-care/ADL training;Visual/perceptual remediation/compensation;Patient/family education;Passive range of motion;Cryotherapy;Electrical Stimulation;Contrast Bath;Coping strategies training;Moist Heat;Therapeutic exercise;Manual Therapy;Therapeutic activities;Neuromuscular education;Cognitive remediation/compensation    Plan Continue to work on functional problem-solving and compensatory techniques for BADLs    Consulted and Agree with Plan of Care Patient;Family member/caregiver    Family Member Consulted Wife [name])           Patient will benefit from skilled therapeutic intervention in order to improve the following deficits and impairments:   Body Structure / Function / Physical Skills: ADL,UE functional use,Body mechanics,Pain,FMC,GMC,Coordination,ROM,IADL,Strength,Dexterity Cognitive Skills: Attention,Problem Solve,Safety Awareness Psychosocial Skills: Environmental  Adaptations   Visit Diagnosis: Hemiplegia and hemiparesis following cerebral infarction  affecting left non-dominant side (HCC)  Other lack of coordination  Other symptoms and signs involving cognitive functions following cerebral infarction  Other symptoms and signs involving the nervous system    Problem List Patient Active Problem List   Diagnosis Date Noted  . Depression with anxiety 01/29/2020  . Leg pain, bilateral 01/29/2020  . Ingrown toenail 07/13/2019  . Lumbar spondylosis 05/02/2018  . Pain in left knee 03/09/2018  . 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 Trochanteric bursitis of left hip 01/16/2018  . Preventative health care 09/26/2017  . HTN (hypertension) 07/19/2015  . Dyslipidemia 07/19/2015  . Great toe pain 02/11/2014  . 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 Obesity (BMI 30-39.9) 06/25/2013  . Renal insufficiency 06/25/2013  . Weakness of left arm 06/25/2013  . Sebaceous cyst 03/03/2011  . Sprain of lumbar region 07/31/2010  . Rib pain, left 08/29/2009  . Carotid artery stenosis, bilateral 05/02/2009  . Eczema, atopic 05/31/2008  . Vitamin D deficiency 03/01/2008  . BPH (benign prostatic hyperplasia) 08/06/2007  . Fasting hyperglycemia 12/21/2006  . History of right MCA infarct [address] OTR/L, MSOT 07/23/2020, 4:56 PM  [place]. [place], [name], [address] Phone: [phone]   Fax:  [phone]  Name: EVER GUSTAFSON MRN: [id number] Date of Birth: 03/16/1941

## 2020-07-28 ENCOUNTER — Ambulatory Visit: Payer: PPO | Admitting: Occupational Therapy

## 2020-07-28 ENCOUNTER — Encounter: Payer: Self-pay | Admitting: Occupational Therapy

## 2020-07-28 ENCOUNTER — Other Ambulatory Visit: Payer: Self-pay

## 2020-07-28 DIAGNOSIS — I69354 Hemiplegia and hemiparesis following cerebral infarction affecting left non-dominant side: Secondary | ICD-10-CM | POA: Diagnosis not present

## 2020-07-28 DIAGNOSIS — R29818 Other symptoms and signs involving the nervous system: Secondary | ICD-10-CM

## 2020-07-28 DIAGNOSIS — R278 Other lack of coordination: Secondary | ICD-10-CM

## 2020-07-28 DIAGNOSIS — I69318 Other symptoms and signs involving cognitive functions following cerebral infarction: Secondary | ICD-10-CM

## 2020-07-28 NOTE — Therapy (Signed)
[facility]. [place], [name], [address] Phone: [phone]   Fax:  [phone]  Occupational Therapy Treatment  Patient Details  Name: William [name] MRN: [id number] Date of Birth: 04-16-1941 Referring Provider (OT): Roma Schanz, DO (PCP)   Encounter Date: 07/28/2020   OT End of Session - 07/28/20 1122    Visit Number 5    Number of Visits 13    Authorization Type [employer]    Progress Note Due on Visit 10    OT Start Time 1100    OT Stop Time 1145    OT Time Calculation (min) 45 min    Activity Tolerance Patient tolerated treatment well    Behavior During Therapy [facility] for tasks assessed/performed           Past Medical History:  Diagnosis Date  . Arthritis    low back  . 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 Bladder stone   . BPH (benign prostatic hyperplasia) 08/06/2007  . Carotid artery occlusion   . 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 Closed fracture of fifth metacarpal bone 05/15/2015  . Eczema   . Fasting hyperglycemia 12/21/2006  . GERD (gastroesophageal reflux disease)   . History of carotid artery stenosis    S/P BILATERAL CEA  . 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 HTN (hypertension) 07/19/2015  . Hyperlipidemia   . Hypertension   . 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 Nocturia   . Renal insufficiency 06/25/2013  . S/P carotid endarterectomy    BILATERAL ICA--  PATENT PER DUPLEX  05-19-2012  . 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 Urinary frequency   . Vitamin D deficiency     Past Surgical History:  Procedure Laterality Date  . APPENDECTOMY  AS CHILD  . CARDIOVASCULAR STRESS TEST  03-27-2012  DR CRENSHAW   LOW RISK LEXISCAN STUDY-- PROBABLE NORMAL PERFUSION AND SOFT TISSUE ATTENUATION/  NO ISCHEMIA/ EF 51%  . CAROTID  ENDARTERECTOMY Bilateral LEFT  11-12-2008  DR GREG HAYES   RIGHT ICA  2006  ([facility])  . CYSTOSCOPY WITH LITHOLAPAXY N/A 02/26/2013   Procedure: CYSTOSCOPY WITH LITHOLAPAXY;  Surgeon: Franchot Gallo, MD;  Location: [facility];  Service: Urology;  Laterality: N/A;  . EYE SURGERY  Jan. 2016   cataract surgery both eyes  . INGUINAL HERNIA REPAIR Right 11-08-2006  . MASS EXCISION N/A 03/03/2016   Procedure: EXCISION OF BACK  MASS;  Surgeon: Stark Klein, MD;  Location: [place];  Service: General;  Laterality: N/A;  . 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 PROSTATE SURGERY    . TRANSURETHRAL RESECTION OF PROSTATE N/A 02/26/2013   Procedure: TRANSURETHRAL RESECTION OF THE PROSTATE WITH GYRUS INSTRUMENTS;  Surgeon: Franchot Gallo, MD;  Location: [facility];  Service: Urology;  Laterality: N/A;    There were no vitals filed for this visit.   Subjective Assessment - 07/28/20 1103    Subjective  Pt reports things have been going well at home and that "you're doing a standup job." Per wife report, pt continues to demo difficulty with UB dressing and reported it appears to get worse the more he practiced it.    Pertinent History R MCA infarct (2006)  Patient Stated Goals Increaing functional use of LUE    Currently in Pain? No/denies            OT Treatments/Exercises (OP) - 07/28/20 1119      ADLs   UB Dressing Donning/doffing zip-up jacket; attempted doffing with previously written 4-step instructions and pt demo'd difficulty, required Mod A to doff. OT graded activity to have pt attempt w/out visual cue for donning and pt completed activity with Min A.   Due to reported difficulty at home, OT instructed pt to practice donning jacket with visual/verbal cues and doffing w/out cueing     Cognitive Exercises   Other Cognitive Exercises 1 Connect 4 used to facilitate problem-solving, attention to task, and working memory; pt  improved with increased repetition, but required min verbal and visual cues to follow progress of game and see available moves   Pt instructed to use L hand throughout activity     Neurological Re-education Exercises   Theraputty - Grip Pinch-and-pull with yellow putty completed 10x   pt required mod v/c to return attention to task           OT Education - 07/28/20 1642    Education Details Education provided to spouse on modifying compensatory strategy with UB dressing addressed in previous session due to wife's report that pt experienced difficulty with strategy at home.    Person(s) Educated Spouse    Methods Explanation    Comprehension Verbalized understanding            OT Short Term Goals - 07/28/20 1131      OT SHORT TERM GOAL #1   Title Pt will complete UB dressing with Min A at least 50% of the time    Baseline Mod A    Time 3    Period Weeks    Status On-going    Target Date 08/08/20      OT SHORT TERM GOAL #2   Title Pt will improve GMC for greater participation in LB dressing as evidenced by increasing Box and Blocks Test by at least 10 blocks    Baseline L hand (20 blocks); R hand (29 blocks)    Time 3    Period Weeks    Status On-going   L hand (23 blocks); R hand (36 blocks). Pt required v/c to continue task due to decreased working memory.     OT SHORT TERM GOAL #3   Title Pt will complete simple tabletop puzzle/maze with Min A at least 50% of the time to improve motor planning    Time 3    Period Weeks    Status On-going            OT Long Term Goals - 07/16/20 1411      OT LONG TERM GOAL #1   Title Pt will complete HEP designed for strengthening and functional use of LUE with Mod I and report carryover at home    Time 6    Period Weeks    Status On-going      OT LONG TERM GOAL #2   Title Pt will be able to complete LB dressing with Min A at least 50% of the time    Baseline Mod A donning pants    Time 6    Period Weeks    Status On-going       OT LONG TERM GOAL #3   Title Pt will improve FMC to increase functional use of UE as evidenced by decreasing  9-HPT by at least 10 seconds with L hand    Time 6    Period Weeks    Status On-going      OT LONG TERM GOAL #4   Title Pt will complete a cognitive-based activity with Min A to improve executive functioning skills and safety during daily activities    Time 6    Period Weeks    Status On-going            Plan - 07/28/20 1122    Clinical Impression Statement Decreased attention, memory, and functional problem-solving continue to be a significant limiting factors during activities. Pt demo'd increased problem-solving during simple maze activity compared with prior session and was able to complete moderate maze with min cueing. OT provided education to continue/increase engagement in cognitive activities at home; pt and wife were both receptive.    OT Occupational Profile and History Detailed Assessment- Review of Records and additional review of physical, cognitive, psychosocial history related to current functional performance    Occupational performance deficits (Please refer to evaluation for details): ADL's;IADL's;Leisure    Body Structure / Function / Physical Skills ADL;UE functional use;Body mechanics;Pain;FMC;GMC;Coordination;ROM;IADL;Strength;Dexterity    Cognitive Skills Attention;Problem Solve;Safety Awareness    Psychosocial Skills Environmental  Adaptations    Rehab Potential Good    Clinical Decision Making Multiple treatment options, significant modification of task necessary    Comorbidities Affecting Occupational Performance: May have comorbidities impacting occupational performance    Modification or Assistance to Complete Evaluation  Min-Moderate modification of tasks or assist with assess necessary to complete eval    OT Frequency 2x / week    OT Duration 6 weeks    OT Treatment/Interventions Self-care/ADL training;Visual/perceptual  remediation/compensation;Patient/family education;Passive range of motion;Cryotherapy;Electrical Stimulation;Contrast Bath;Coping strategies training;Moist Heat;Therapeutic exercise;Manual Therapy;Therapeutic activities;Neuromuscular education;Cognitive remediation/compensation    Plan Continue to work on functional problem-solving and compensatory techniques for BADLs    Consulted and Agree with Plan of Care Patient;Family member/caregiver    Family Member Consulted Wife [name])           Patient will benefit from skilled therapeutic intervention in order to improve the following deficits and impairments:   Body Structure / Function / Physical Skills: ADL,UE functional use,Body mechanics,Pain,FMC,GMC,Coordination,ROM,IADL,Strength,Dexterity Cognitive Skills: Attention,Problem Solve,Safety Awareness Psychosocial Skills: Environmental  Adaptations   Visit Diagnosis: Hemiplegia and hemiparesis following cerebral infarction affecting left non-dominant side (HCC)  Other lack of coordination  Other symptoms and signs involving cognitive functions following cerebral infarction  Other symptoms and signs involving the nervous system    Problem List Patient Active Problem List   Diagnosis Date Noted  . Depression with anxiety 01/29/2020  . Leg pain, bilateral 01/29/2020  . Ingrown toenail 07/13/2019  . Lumbar spondylosis 05/02/2018  . Pain in left knee 03/09/2018  . 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 Trochanteric bursitis of left hip 01/16/2018  . Preventative health care 09/26/2017  . HTN (hypertension) 07/19/2015  . Dyslipidemia 07/19/2015  . Great toe pain 02/11/2014  . 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 Obesity (BMI 30-39.9) 06/25/2013  . Renal insufficiency 06/25/2013  . Weakness of left arm 06/25/2013  . Sebaceous cyst 03/03/2011  . Sprain of lumbar region 07/31/2010  . Rib pain, left 08/29/2009  . Carotid artery stenosis, bilateral 05/02/2009  .  Eczema, atopic 05/31/2008  . Vitamin D deficiency 03/01/2008  . BPH (benign prostatic hyperplasia) 08/06/2007  . Fasting hyperglycemia 12/21/2006  . History of right MCA infarct 2006     Lige Lakeman, OTR/L,  MSOT 07/28/2020, 5:49 PM  [place]. [place], [name], [address] Phone: [phone]   Fax:  [phone]  Name: William [name] MRN: [id number] Date of Birth: 05-Feb-1941

## 2020-07-30 ENCOUNTER — Encounter: Payer: Self-pay | Admitting: Occupational Therapy

## 2020-07-30 ENCOUNTER — Other Ambulatory Visit: Payer: Self-pay

## 2020-07-30 ENCOUNTER — Ambulatory Visit: Payer: PPO | Admitting: Occupational Therapy

## 2020-07-30 DIAGNOSIS — R29818 Other symptoms and signs involving the nervous system: Secondary | ICD-10-CM

## 2020-07-30 DIAGNOSIS — I69354 Hemiplegia and hemiparesis following cerebral infarction affecting left non-dominant side: Secondary | ICD-10-CM

## 2020-07-30 DIAGNOSIS — I69318 Other symptoms and signs involving cognitive functions following cerebral infarction: Secondary | ICD-10-CM

## 2020-07-30 DIAGNOSIS — R278 Other lack of coordination: Secondary | ICD-10-CM

## 2020-07-30 NOTE — Therapy (Signed)
[facility]. [place], [name], [address] Phone: [phone]   Fax:  [phone]  Occupational Therapy Treatment  Patient Details  Name: William [name] MRN: [id number] Date of Birth: 09/09/1940 Referring Provider (OT): Roma Schanz, DO (PCP)   Encounter Date: 07/30/2020   OT End of Session - 07/30/20 1441    Visit Number 6    Number of Visits 13    Authorization Type [employer]    Progress Note Due on Visit 10    OT Start Time 1314    OT Stop Time 1356    OT Time Calculation (min) 42 min    Activity Tolerance Patient tolerated treatment well    Behavior During Therapy [facility] for tasks assessed/performed           Past Medical History:  Diagnosis Date  . Arthritis    low back  . 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 Bladder stone   . BPH (benign prostatic hyperplasia) 08/06/2007  . Carotid artery occlusion   . 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 Closed fracture of fifth metacarpal bone 05/15/2015  . Eczema   . Fasting hyperglycemia 12/21/2006  . GERD (gastroesophageal reflux disease)   . History of carotid artery stenosis    S/P BILATERAL CEA  . 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 HTN (hypertension) 07/19/2015  . Hyperlipidemia   . Hypertension   . 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 Nocturia   . Renal insufficiency 06/25/2013  . S/P carotid endarterectomy    BILATERAL ICA--  PATENT PER DUPLEX  05-19-2012  . 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 Urinary frequency   . Vitamin D deficiency     Past Surgical History:  Procedure Laterality Date  . APPENDECTOMY  AS CHILD  . CARDIOVASCULAR STRESS TEST  03-27-2012  DR CRENSHAW   LOW RISK LEXISCAN STUDY-- PROBABLE NORMAL PERFUSION AND SOFT TISSUE ATTENUATION/  NO ISCHEMIA/ EF 51%  . CAROTID  ENDARTERECTOMY Bilateral LEFT  11-12-2008  DR GREG HAYES   RIGHT ICA  2006  ([facility])  . CYSTOSCOPY WITH LITHOLAPAXY N/A 02/26/2013   Procedure: CYSTOSCOPY WITH LITHOLAPAXY;  Surgeon: Franchot Gallo, MD;  Location: [facility];  Service: Urology;  Laterality: N/A;  . EYE SURGERY  Jan. 2016   cataract surgery both eyes  . INGUINAL HERNIA REPAIR Right 11-08-2006  . MASS EXCISION N/A 03/03/2016   Procedure: EXCISION OF BACK  MASS;  Surgeon: Stark Klein, MD;  Location: [place];  Service: General;  Laterality: N/A;  . 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 PROSTATE SURGERY    . TRANSURETHRAL RESECTION OF PROSTATE N/A 02/26/2013   Procedure: TRANSURETHRAL RESECTION OF THE PROSTATE WITH GYRUS INSTRUMENTS;  Surgeon: Franchot Gallo, MD;  Location: [facility];  Service: Urology;  Laterality: N/A;    There were no vitals filed for this visit.   Subjective Assessment - 07/30/20 1543    Subjective  Pt and his wife report UB dressing has improved at home    Pertinent History R MCA infarct (2006)    Patient Stated Goals Increaing functional use of LUE    Currently in Pain? No/denies  OT Treatments/Exercises (OP) - 07/30/20 1347      ADLs   UB Dressing Min A donning zip-up jacket   Cueing from OT required to reposition R side of jacket to facilitate threading of RUE     Cognitive Exercises   Other Cognitive Exercises 1 Pt completed 5-step construction of a paper airplane with written instructions and step-by-step diagram; completed 2 times with pt demo'ing min improvement during second attempt   OT provided demonstration, verbal and visual cues, and grading to facilitate success     Hand Exercises   Other Hand Exercises Hand grip exerciser used to pick up blocks one at a time using L hand to improve strength and attention to L side            OT Education - 07/30/20 1553    Education Details Education provided on  benefit of participating in cognitive-based activities/games at home to continue to improve executive functioning.    Person(s) Educated Patient;Spouse    Methods Explanation    Comprehension Verbalized understanding            OT Short Term Goals - 07/28/20 1131      OT SHORT TERM GOAL #1   Title Pt will complete UB dressing with Min A at least 50% of the time    Baseline Mod A    Time 3    Period Weeks    Status On-going    Target Date 08/08/20      OT SHORT TERM GOAL #2   Title Pt will improve GMC for greater participation in LB dressing as evidenced by increasing Box and Blocks Test by at least 10 blocks    Baseline L hand (20 blocks); R hand (29 blocks)    Time 3    Period Weeks    Status On-going   L hand (23 blocks); R hand (36 blocks). Pt required v/c to continue task due to decreased working memory.     OT SHORT TERM GOAL #3   Title Pt will complete simple tabletop puzzle/maze with Min A at least 50% of the time to improve motor planning    Time 3    Period Weeks    Status On-going             OT Long Term Goals - 07/16/20 1411      OT LONG TERM GOAL #1   Title Pt will complete HEP designed for strengthening and functional use of LUE with Mod I and report carryover at home    Time 6    Period Weeks    Status On-going      OT LONG TERM GOAL #2   Title Pt will be able to complete LB dressing with Min A at least 50% of the time    Baseline Mod A donning pants    Time 6    Period Weeks    Status On-going      OT LONG TERM GOAL #3   Title Pt will improve [place] to increase functional use of UE as evidenced by decreasing 9-HPT by at least 10 seconds with L hand    Time 6    Period Weeks    Status On-going      OT LONG TERM GOAL #4   Title Pt will complete a cognitive-based activity with Min A to improve executive functioning skills and safety during daily activities    Time 6    Period Weeks    Status On-going  Plan - 07/30/20 1556     Clinical Impression Statement Donning zip-up jacket has improved since previous session; will continue to assess for consistency. Constructing paper airplane activity used to facilitate working memory, sequencing, problem-solving, and self-correcting. Pt required cueing from OT to fully complete multi-step instruction, recall sequence of steps, and with ideation. Pt improved success during second attempt.    OT Occupational Profile and History Detailed Assessment- Review of Records and additional review of physical, cognitive, psychosocial history related to current functional performance    Occupational performance deficits (Please refer to evaluation for details): ADL's;IADL's;Leisure    Body Structure / Function / Physical Skills ADL;UE functional use;Body mechanics;Pain;FMC;GMC;Coordination;ROM;IADL;Strength;Dexterity    Cognitive Skills Attention;Problem Solve;Safety Awareness    Psychosocial Skills Environmental  Adaptations    Rehab Potential Good    Clinical Decision Making Multiple treatment options, significant modification of task necessary    Comorbidities Affecting Occupational Performance: May have comorbidities impacting occupational performance    Modification or Assistance to Complete Evaluation  Min-Moderate modification of tasks or assist with assess necessary to complete eval    OT Frequency 2x / week    OT Duration 6 weeks    OT Treatment/Interventions Self-care/ADL training;Visual/perceptual remediation/compensation;Patient/family education;Passive range of motion;Cryotherapy;Electrical Stimulation;Contrast Bath;Coping strategies training;Moist Heat;Therapeutic exercise;Manual Therapy;Therapeutic activities;Neuromuscular education;Cognitive remediation/compensation    Plan Continue to work on functional problem-solving and compensatory techniques for BADLs    Consulted and Agree with Plan of Care Patient;Family member/caregiver    Family Member Consulted Wife [name])            Patient will benefit from skilled therapeutic intervention in order to improve the following deficits and impairments:   Body Structure / Function / Physical Skills: ADL,UE functional use,Body mechanics,Pain,FMC,GMC,Coordination,ROM,IADL,Strength,Dexterity Cognitive Skills: Attention,Problem Solve,Safety Awareness Psychosocial Skills: Environmental  Adaptations   Visit Diagnosis: Hemiplegia and hemiparesis following cerebral infarction affecting left non-dominant side (HCC)  Other lack of coordination  Other symptoms and signs involving cognitive functions following cerebral infarction  Other symptoms and signs involving the nervous system    Problem List Patient Active Problem List   Diagnosis Date Noted  . Depression with anxiety 01/29/2020  . Leg pain, bilateral 01/29/2020  . Ingrown toenail 07/13/2019  . Lumbar spondylosis 05/02/2018  . Pain in left knee 03/09/2018  . 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 Trochanteric bursitis of left hip 01/16/2018  . Preventative health care 09/26/2017  . HTN (hypertension) 07/19/2015  . Dyslipidemia 07/19/2015  . Great toe pain 02/11/2014  . 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 Obesity (BMI 30-39.9) 06/25/2013  . Renal insufficiency 06/25/2013  . Weakness of left arm 06/25/2013  . Sebaceous cyst 03/03/2011  . Sprain of lumbar region 07/31/2010  . Rib pain, left 08/29/2009  . Carotid artery stenosis, bilateral 05/02/2009  . Eczema, atopic 05/31/2008  . Vitamin D deficiency 03/01/2008  . BPH (benign prostatic hyperplasia) 08/06/2007  . Fasting hyperglycemia 12/21/2006  . History of right MCA infarct [address], OTR/L, MSOT 07/30/2020, 4:09 PM  [place]. [place], [name], [address] Phone: [phone]   Fax:  [phone]  Name: William [name] MRN: [id number] Date of Birth: September 27, 1940

## 2020-07-31 ENCOUNTER — Ambulatory Visit (INDEPENDENT_AMBULATORY_CARE_PROVIDER_SITE_OTHER): Payer: PPO | Admitting: Family Medicine

## 2020-07-31 ENCOUNTER — Encounter: Payer: Self-pay | Admitting: Family Medicine

## 2020-07-31 ENCOUNTER — Other Ambulatory Visit: Payer: Self-pay | Admitting: Family Medicine

## 2020-07-31 ENCOUNTER — Ambulatory Visit (HOSPITAL_BASED_OUTPATIENT_CLINIC_OR_DEPARTMENT_OTHER)
Admission: RE | Admit: 2020-07-31 | Discharge: 2020-07-31 | Disposition: A | Discharge status: Home / Self Care | Payer: PPO | Source: Ambulatory Visit | Attending: Family Medicine | Admitting: Family Medicine

## 2020-07-31 VITALS — BP 108/68 | HR 66 | Temp 98.0°F | Resp 18 | Ht 71.0 in | Wt 216.6 lb

## 2020-07-31 DIAGNOSIS — Z1211 Encounter for screening for malignant neoplasm of colon: Secondary | ICD-10-CM

## 2020-07-31 DIAGNOSIS — N289 Disorder of kidney and ureter, unspecified: Secondary | ICD-10-CM

## 2020-07-31 DIAGNOSIS — R29890 Loss of height: Secondary | ICD-10-CM | POA: Insufficient documentation

## 2020-07-31 DIAGNOSIS — M85851 Other specified disorders of bone density and structure, right thigh: Secondary | ICD-10-CM | POA: Diagnosis not present

## 2020-07-31 DIAGNOSIS — M85852 Other specified disorders of bone density and structure, left thigh: Secondary | ICD-10-CM | POA: Diagnosis not present

## 2020-07-31 DIAGNOSIS — E785 Hyperlipidemia, unspecified: Secondary | ICD-10-CM | POA: Diagnosis not present

## 2020-07-31 DIAGNOSIS — I10 Essential (primary) hypertension: Secondary | ICD-10-CM

## 2020-07-31 DIAGNOSIS — Z13820 Encounter for screening for osteoporosis: Secondary | ICD-10-CM | POA: Insufficient documentation

## 2020-07-31 LAB — COMPREHENSIVE METABOLIC PANEL
ALT: 14 U/L (ref 0–53)
AST: 17 U/L (ref 0–37)
Albumin: 4.1 g/dL (ref 3.5–5.2)
Alkaline Phosphatase: 54 U/L (ref 39–117)
BUN: 36 mg/dL — ABNORMAL HIGH (ref 6–23)
CO2: 27 mEq/L (ref 19–32)
Calcium: 9.5 mg/dL (ref 8.4–10.5)
Chloride: 106 mEq/L (ref 96–112)
Creatinine, Ser: 2.1 mg/dL — ABNORMAL HIGH (ref 0.40–1.50)
GFR: 29.7 mL/min — ABNORMAL LOW (ref >60.00)
Glucose, Bld: 101 mg/dL — ABNORMAL HIGH (ref 70–99)
Potassium: 4.3 mEq/L (ref 3.5–5.1)
Sodium: 141 mEq/L (ref 135–145)
Total Bilirubin: 0.4 mg/dL (ref 0.2–1.2)
Total Protein: 7.3 g/dL (ref 6.0–8.3)

## 2020-07-31 LAB — LIPID PANEL
Cholesterol: 208 mg/dL — ABNORMAL HIGH (ref 0–200)
HDL: 56.7 mg/dL (ref >39.00)
LDL Cholesterol: 133 mg/dL — ABNORMAL HIGH (ref 0–99)
NonHDL: 151.05
Total CHOL/HDL Ratio: 4
Triglycerides: 88 mg/dL (ref 0.0–149.0)
VLDL: 17.6 mg/dL (ref 0.0–40.0)

## 2020-07-31 LAB — MICROALBUMIN / CREATININE URINE RATIO
Creatinine,U: 154.4 mg/dL
Microalb Creat Ratio: 4.8 mg/g (ref 0.0–30.0)
Microalb, Ur: 7.4 mg/dL — ABNORMAL HIGH (ref 0.0–1.9)

## 2020-07-31 MED ORDER — LIVALO 1 MG PO TABS
ORAL_TABLET | ORAL | 2 refills | Status: DC
Start: 2020-07-31 — End: 2020-07-31

## 2020-07-31 MED FILL — LIVALO 1 MG TABLET: 1 | 30 days supply | Qty: 30 | Fill #0

## 2020-07-31 NOTE — Patient Instructions (Signed)
https://www.nhlbi.nih.gov/files/docs/public/heart/dash_brief.pdf">  DASH Eating Plan DASH stands for Dietary Approaches to Stop Hypertension. The DASH eating plan is a healthy eating plan that has been shown to:  Reduce high blood pressure (hypertension).  Reduce your risk for type 2 diabetes, heart disease, and stroke.  Help with weight loss. What are tips for following this plan? Reading food labels  Check food labels for the amount of salt (sodium) per serving. Choose foods with less than 5 percent of the Daily Value of sodium. Generally, foods with less than 300 milligrams (mg) of sodium per serving fit into this eating plan.  To find whole grains, look for the word "whole" as the first word in the ingredient list. Shopping  Buy products labeled as "low-sodium" or "no salt added."  Buy fresh foods. Avoid canned foods and pre-made or frozen meals. Cooking  Avoid adding salt when cooking. Use salt-free seasonings or herbs instead of table salt or sea salt. Check with your health care provider or pharmacist before using salt substitutes.  Do not fry foods. Cook foods using healthy methods such as baking, boiling, grilling, roasting, and broiling instead.  Cook with heart-healthy oils, such as olive, canola, avocado, soybean, or sunflower oil. Meal planning  Eat a balanced diet that includes: ? 4 or more servings of fruits and 4 or more servings of vegetables each day. Try to fill one-half of your plate with fruits and vegetables. ? 6-8 servings of whole grains each day. ? Less than 6 oz (170 g) of lean meat, poultry, or fish each day. A 3-oz (85-g) serving of meat is about the same size as a deck of cards. One egg equals 1 oz (28 g). ? 2-3 servings of low-fat dairy each day. One serving is 1 cup (237 mL). ? 1 serving of nuts, seeds, or beans 5 times each week. ? 2-3 servings of heart-healthy fats. Healthy fats called omega-3 fatty acids are found in foods such as walnuts,  flaxseeds, fortified milks, and eggs. These fats are also found in cold-water fish, such as sardines, salmon, and mackerel.  Limit how much you eat of: ? Canned or prepackaged foods. ? Food that is high in trans fat, such as some fried foods. ? Food that is high in saturated fat, such as fatty meat. ? Desserts and other sweets, sugary drinks, and other foods with added sugar. ? Full-fat dairy products.  Do not salt foods before eating.  Do not eat more than 4 egg yolks a week.  Try to eat at least 2 vegetarian meals a week.  Eat more home-cooked food and less restaurant, buffet, and fast food.   Lifestyle  When eating at a restaurant, ask that your food be prepared with less salt or no salt, if possible.  If you drink alcohol: ? Limit how much you use to:  0-1 drink a day for women who are not pregnant.  0-2 drinks a day for men. ? Be aware of how much alcohol is in your drink. In the [place] one drink equals one 12 oz bottle of beer (355 mL), one 5 oz glass of wine (148 mL), or one 1 oz glass of hard liquor (44 mL). General information  Avoid eating more than 2,300 mg of salt a day. If you have hypertension, you may need to reduce your sodium intake to 1,500 mg a day.  Work with your health care provider to maintain a healthy body weight or to lose weight. Ask what an ideal weight is for   you.  Get at least 30 minutes of exercise that causes your heart to beat faster (aerobic exercise) most days of the week. Activities may include walking, swimming, or biking.  Work with your health care provider or dietitian to adjust your eating plan to your individual calorie needs. What foods should I eat? Fruits All fresh, dried, or frozen fruit. Canned fruit in natural juice (without added sugar). Vegetables Fresh or frozen vegetables (raw, steamed, roasted, or grilled). Low-sodium or reduced-sodium tomato and vegetable juice. Low-sodium or reduced-sodium tomato sauce and tomato paste.  Low-sodium or reduced-sodium canned vegetables. Grains Whole-grain or whole-wheat bread. Whole-grain or whole-wheat pasta. Brown rice. Oatmeal. Quinoa. Bulgur. Whole-grain and low-sodium cereals. Pita bread. Low-fat, low-sodium crackers. Whole-wheat flour tortillas. Meats and other proteins Skinless chicken or turkey. Ground chicken or turkey. Pork with fat trimmed off. Fish and seafood. Egg whites. Dried beans, peas, or lentils. Unsalted nuts, nut butters, and seeds. Unsalted canned beans. Lean cuts of beef with fat trimmed off. Low-sodium, lean precooked or cured meat, such as sausages or meat loaves. Dairy Low-fat (1%) or fat-free (skim) milk. Reduced-fat, low-fat, or fat-free cheeses. Nonfat, low-sodium ricotta or cottage cheese. Low-fat or nonfat yogurt. Low-fat, low-sodium cheese. Fats and oils Soft margarine without trans fats. Vegetable oil. Reduced-fat, low-fat, or light mayonnaise and salad dressings (reduced-sodium). Canola, safflower, olive, avocado, soybean, and sunflower oils. Avocado. Seasonings and condiments Herbs. Spices. Seasoning mixes without salt. Other foods Unsalted popcorn and pretzels. Fat-free sweets. The items listed above may not be a complete list of foods and beverages you can eat. Contact a dietitian for more information. What foods should I avoid? Fruits Canned fruit in a light or heavy syrup. Fried fruit. Fruit in cream or butter sauce. Vegetables Creamed or fried vegetables. Vegetables in a cheese sauce. Regular canned vegetables (not low-sodium or reduced-sodium). Regular canned tomato sauce and paste (not low-sodium or reduced-sodium). Regular tomato and vegetable juice (not low-sodium or reduced-sodium). Pickles. Olives. Grains Baked goods made with fat, such as croissants, muffins, or some breads. Dry pasta or rice meal packs. Meats and other proteins Fatty cuts of meat. Ribs. Fried meat. Bacon. Bologna, salami, and other precooked or cured meats, such as  sausages or meat loaves. Fat from the back of a pig (fatback). Bratwurst. Salted nuts and seeds. Canned beans with added salt. Canned or smoked fish. Whole eggs or egg yolks. Chicken or turkey with skin. Dairy Whole or 2% milk, cream, and half-and-half. Whole or full-fat cream cheese. Whole-fat or sweetened yogurt. Full-fat cheese. Nondairy creamers. Whipped toppings. Processed cheese and cheese spreads. Fats and oils Butter. Stick margarine. Lard. Shortening. Ghee. Bacon fat. Tropical oils, such as coconut, palm kernel, or palm oil. Seasonings and condiments Onion salt, garlic salt, seasoned salt, table salt, and sea salt. Worcestershire sauce. Tartar sauce. Barbecue sauce. Teriyaki sauce. Soy sauce, including reduced-sodium. Steak sauce. Canned and packaged gravies. Fish sauce. Oyster sauce. Cocktail sauce. Store-bought horseradish. Ketchup. Mustard. Meat flavorings and tenderizers. Bouillon cubes. Hot sauces. Pre-made or packaged marinades. Pre-made or packaged taco seasonings. Relishes. Regular salad dressings. Other foods Salted popcorn and pretzels. The items listed above may not be a complete list of foods and beverages you should avoid. Contact a dietitian for more information. Where to find more information  National Heart, Lung, and Blood Institute: www.nhlbi.nih.gov  American Heart Association: www.heart.org  Academy of Nutrition and Dietetics: www.eatright.org  National Kidney Foundation: www.kidney.org Summary  The DASH eating plan is a healthy eating plan that has been shown to   reduce high blood pressure (hypertension). It may also reduce your risk for type 2 diabetes, heart disease, and stroke.  When on the DASH eating plan, aim to eat more fresh fruits and vegetables, whole grains, lean proteins, low-fat dairy, and heart-healthy fats.  With the DASH eating plan, you should limit salt (sodium) intake to 2,300 mg a day. If you have hypertension, you may need to reduce your  sodium intake to 1,500 mg a day.  Work with your health care provider or dietitian to adjust your eating plan to your individual calorie needs. This information is not intended to replace advice given to you by your health care provider. Make sure you discuss any questions you have with your health care provider. Document Revised: 05/04/2019 Document Reviewed: 05/04/2019 Elsevier Patient Education  2021 Elsevier Inc.  

## 2020-07-31 NOTE — Assessment & Plan Note (Signed)
Encouraged heart healthy diet, increase exercise, avoid trans fats, consider a krill oil cap daily 

## 2020-07-31 NOTE — Progress Notes (Signed)
Patient ID: ECHO ALLSBROOK, male    DOB: Apr 19, 1941  Age: 80 y.o. MRN: [id number]    Subjective:  Subjective  HPI William [name] presents for htn and cholesterol     His wife is with him.    No new complaints   Pt/ ot is helping with his L hand  Review of Systems  Constitutional: Negative for appetite change, diaphoresis, fatigue and unexpected weight change.  Eyes: Negative for pain, redness and visual disturbance.  Respiratory: Negative for cough, chest tightness, shortness of breath and wheezing.   Cardiovascular: Negative for chest pain, palpitations and leg swelling.  Endocrine: Negative for cold intolerance, heat intolerance, polydipsia, polyphagia and polyuria.  Genitourinary: Negative for difficulty urinating, dysuria and frequency.  Neurological: Negative for dizziness, light-headedness, numbness and headaches.    History Past Medical History:  Diagnosis Date  . Arthritis    low back  . 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 Bladder stone   . BPH (benign prostatic hyperplasia) 08/06/2007  . Carotid artery occlusion   . 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 Closed fracture of fifth metacarpal bone 05/15/2015  . Eczema   . Fasting hyperglycemia 12/21/2006  . GERD (gastroesophageal reflux disease)   . History of carotid artery stenosis    S/P BILATERAL CEA  . 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 HTN (hypertension) 07/19/2015  . Hyperlipidemia   . Hypertension   . 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 Nocturia   . Renal insufficiency 06/25/2013  . S/P carotid endarterectomy    BILATERAL ICA--  PATENT PER DUPLEX  05-19-2012  . 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 Urinary frequency   . Vitamin D deficiency     He has a past surgical history that includes Carotid endarterectomy (Bilateral, LEFT  11-12-2008  DR GREG HAYES); Inguinal  hernia repair (Right, 11-08-2006); Cardiovascular stress test (03-27-2012  DR CRENSHAW); Appendectomy (AS CHILD); Cystoscopy with litholapaxy (N/A, 02/26/2013); Transurethral resection of prostate (N/A, 02/26/2013); Prostate surgery; Mohs surgery (Left, 1/ 2016); Eye surgery (Jan. 2016); and Mass excision (N/A, 03/03/2016).   His family history includes Bipolar disorder in his mother; Heart disease in his father and mother.He reports that he quit smoking about 15 years ago. His smoking use included cigarettes. He has a 80.00 pack-year smoking history. He has never used smokeless tobacco. He reports current alcohol use. He reports that he does not use drugs.  Current Outpatient Medications on File Prior to Visit  Medication Sig Dispense Refill  . acetaminophen (TYLENOL) 500 MG tablet Take 500 mg by mouth every 6 (six) hours as needed (1 tab as needed).    [name] amLODipine (NORVASC) 5 MG tablet Take 1 tablet (5 mg total) by mouth daily. (Patient taking differently: Take 10 mg by mouth daily.) 90 tablet 1  . aspirin 325 MG EC tablet Take 325 mg by mouth daily.    . carbamazepine (TEGRETOL-XR) 100 MG 12 hr tablet 1 qam  2  qhs 270 tablet 1  . Cholecalciferol (VITAMIN D) 2000 UNITS CAPS Take by mouth.    . docusate sodium (COLACE) 100 MG capsule Take 100 mg by mouth daily.    . Eszopiclone 3 MG TABS TAKE 1 TABLET BY MOUTH AT BEDTIME AS NEEDED TAKE IMMEDIATELY BEFORE BEDTIME 90 tablet 2  . famotidine (PEPCID)  20 MG tablet Take 20 mg by mouth 2 (two) times daily.    . fenofibrate 160 MG tablet TAKE 1 TABLET (160 MG TOTAL) BY MOUTH DAILY. 90 tablet 1  . Flaxseed, Linseed, (FLAX SEED OIL PO) Take by mouth daily.    . fluticasone (CUTIVATE) 0.05 % cream   3  . lisinopril-hydrochlorothiazide (ZESTORETIC) 10-12.5 MG tablet Take 1 tablet by mouth daily.    [name] LORazepam (ATIVAN) 0.5 MG tablet 2  Bid  1 as needed 150 tablet 5  . Multiple Vitamin (MULTIVITAMIN) tablet Take 1 tablet by mouth daily.    . NONFORMULARY OR  COMPOUNDED ITEM [facility]:  Onychomycosis Nail lacquer - Fluconazole 2%, Terbinafine 1%, DMSO, apply to affected area daily. 120 each 2  . NONFORMULARY OR COMPOUNDED ITEM [facility]:  Antifungal topical - Terbinafine 3%, Fluconazole 2%, Tea Tree Oil 5%, Urea 10/%,, Ibuprofen 2% in DMSO Suspension #31ml. Apply to the affected nail(s) once (at bedtime) or twice daily. 100 each 2  . NONFORMULARY OR COMPOUNDED ITEM Compression socks  #1  Dx low ext edema and ache 1 each 0  . ondansetron (ZOFRAN ODT) 4 MG disintegrating tablet Take 1 tablet (4 mg total) by mouth every 8 (eight) hours as needed for nausea or vomiting. 20 tablet 0  . tamsulosin (FLOMAX) 0.4 MG CAPS capsule TAKE 1 CAPSULE BY MOUTH ONCE DAILY 90 capsule 3  . traMADol (ULTRAM) 50 MG tablet Take 1 tablet (50 mg total) by mouth every 8 (eight) hours as needed. 30 tablet 0   No current facility-administered medications on file prior to visit.     Objective:  Objective  Physical Exam Vitals and nursing note reviewed.  Constitutional:      General: He is sleeping. Vital signs are normal.     Appearance: He is well-developed and well-nourished.  HENT:     Head: Normocephalic and atraumatic.     Mouth/Throat:     Mouth: Oropharynx is clear and moist.  Eyes:     Extraocular Movements: EOM normal.     Pupils: Pupils are equal, round, and reactive to light.  Neck:     Thyroid: No thyromegaly.  Cardiovascular:     Rate and Rhythm: Normal rate and regular rhythm.     Heart sounds: No murmur heard.   Pulmonary:     Effort: Pulmonary effort is normal. No respiratory distress.     Breath sounds: Normal breath sounds. No wheezing or rales.  Chest:     Chest wall: No tenderness.  Musculoskeletal:        General: No tenderness or edema.     Cervical back: Normal range of motion and neck supple.  Skin:    General: Skin is warm and dry.  Neurological:     Mental Status: He is oriented to person, place, and time.   Psychiatric:        Mood and Affect: Mood and affect normal.        Behavior: Behavior normal.        Thought Content: Thought content normal.        Judgment: Judgment normal.    BP 108/68 (BP Location: Right Arm, Patient Position: Sitting, Cuff Size: Normal)   Pulse 66   Temp 98 F (36.7 C) (Oral)   Resp 18   Ht 5\' 11"  (1.803 m)   Wt 216 lb 9.6 oz (98.2 kg)   SpO2 97%   BMI 30.21 kg/m  Wt Readings from Last 3 Encounters:  07/31/20  216 lb 9.6 oz (98.2 kg)  02/05/20 218 lb (98.9 kg)  01/29/20 216 lb (98 kg)     Lab Results  Component Value Date   WBC 7.2 01/29/2020   HGB 12.6 (L) 01/29/2020   HCT 37.6 (L) 01/29/2020   PLT 262.0 01/29/2020   GLUCOSE 108 (H) 01/29/2020   CHOL 183 01/29/2020   TRIG 82.0 01/29/2020   HDL 54.10 01/29/2020   LDLCALC 112 (H) 01/29/2020   ALT 15 01/29/2020   AST 18 01/29/2020   NA 140 01/29/2020   K 4.1 01/29/2020   CL 104 01/29/2020   CREATININE 2.12 (H) 01/29/2020   BUN 31 (H) 01/29/2020   CO2 28 01/29/2020   TSH 1.220 04/25/2019   PSA 2.10 12/24/2016   INR 1.0 11/08/2008   HGBA1C 5.8 (H) 09/17/2019    VAS US CAROTID  Result Date: 01/30/2020 Carotid Arterial Duplex Study Indications:       Carotid artery disease and bilateral endarterectomies. Risk Factors:      Hypertension, past history of smoking, prior CVA. Other Factors:     Right CE 06, Left CE 2010. Comparison Study:  Prior duplex 06/02/2017 suggested patent endarterectomy sites                    bilaterally with residual 1-39% ICA stenosis. Performing Technologist: Delorise Shiner RVT  Examination Guidelines: A complete evaluation includes B-mode imaging, spectral Doppler, color Doppler, and power Doppler as needed of all accessible portions of each vessel. Bilateral testing is considered an integral part of a complete examination. Limited examinations for reoccurring indications may be performed as noted.  Right Carotid Findings:  +----------+--------+--------+--------+--------------------------+--------+           PSV cm/sEDV cm/sStenosisPlaque Description        Comments +----------+--------+--------+--------+--------------------------+--------+ CCA Prox  107     3                                         tortuous +----------+--------+--------+--------+--------------------------+--------+ CCA Mid   58      8       <50%    homogeneous and smooth             +----------+--------+--------+--------+--------------------------+--------+ CCA Distal60      12      <50%    irregular and heterogenous         +----------+--------+--------+--------+--------------------------+--------+ ICA Prox  25      7       1-39%   diffuse                            +----------+--------+--------+--------+--------------------------+--------+ ICA Mid   64      19                                                 +----------+--------+--------+--------+--------------------------+--------+ ICA Distal58      16                                                 +----------+--------+--------+--------+--------------------------+--------+ ECA       70  10                                                 +----------+--------+--------+--------+--------------------------+--------+ +----------+--------+-------+----------------+-------------------+           PSV cm/sEDV cmsDescribe        Arm Pressure (mmHG) +----------+--------+-------+----------------+-------------------+ Subclavian103     0      Multiphasic, WNL                    +----------+--------+-------+----------------+-------------------+ +---------+--------+--+--------+--+---------+ VertebralPSV cm/s63EDV cm/s11Antegrade +---------+--------+--+--------+--+---------+  Left Carotid Findings: +----------+--------+--------+--------+-------------------------+--------+           PSV cm/sEDV cm/sStenosisPlaque Description       Comments  +----------+--------+--------+--------+-------------------------+--------+ CCA Prox  116     20                                                +----------+--------+--------+--------+-------------------------+--------+ CCA Mid   82      18                                                +----------+--------+--------+--------+-------------------------+--------+ CCA Distal54      13                                                +----------+--------+--------+--------+-------------------------+--------+ ICA Prox  33      12      1-39%   irregular and homogeneous         +----------+--------+--------+--------+-------------------------+--------+ ICA Mid   77      26                                                +----------+--------+--------+--------+-------------------------+--------+ ICA Distal65      21                                                +----------+--------+--------+--------+-------------------------+--------+ ECA       59      9                                                 +----------+--------+--------+--------+-------------------------+--------+ +----------+--------+--------+----------------+-------------------+           PSV cm/sEDV cm/sDescribe        Arm Pressure (mmHG) +----------+--------+--------+----------------+-------------------+ [id number]     0       Multiphasic, WNL                    +----------+--------+--------+----------------+-------------------+ +---------+--------+--+--------+--+---------+ VertebralPSV cm/s58EDV cm/s14Antegrade +---------+--------+--+--------+--+---------+   Summary: Right Carotid: Velocities in the right ICA are consistent with a 1-39%  stenosis.                Non-hemodynamically significant plaque <50% noted in the CCA. Left Carotid: Velocities in the left ICA are consistent with a 1-39% stenosis. Vertebrals:  Bilateral vertebral arteries demonstrate antegrade flow. Subclavians: Normal flow  hemodynamics were seen in bilateral subclavian              arteries. *See table(s) above for measurements and observations.  Electronically signed by Deitra Mayo MD on 01/30/2020 at 4:50:34 PM.    Final      Assessment & Plan:  Plan  I am having Bruna Potter start on Livalo. I am also having him maintain his (Flaxseed, Linseed, (FLAX SEED OIL PO)), aspirin, multivitamin, Vitamin D, fluticasone, NONFORMULARY OR COMPOUNDED ITEM, traMADol, NONFORMULARY OR COMPOUNDED ITEM, famotidine, lisinopril-hydrochlorothiazide, NONFORMULARY OR COMPOUNDED ITEM, acetaminophen, docusate sodium, ondansetron, tamsulosin, amLODipine, Eszopiclone, carbamazepine, LORazepam, and fenofibrate.  Meds ordered this encounter  Medications  . Pitavastatin Calcium (LIVALO) 1 MG TABS    Sig: 1 po qd    Dispense:  30 tablet    Refill:  2    Problem List Items Addressed This Visit      Unprioritized   HTN (hypertension)    Well controlled, no changes to meds. Encouraged heart healthy diet such as the DASH diet and exercise as tolerated.       Relevant Medications   Pitavastatin Calcium (LIVALO) 1 MG TABS   Other Relevant Orders   Microalbumin / creatinine urine ratio   Hyperlipidemia - Primary    Encouraged heart healthy diet, increase exercise, avoid trans fats, consider a krill oil cap daily      Relevant Medications   Pitavastatin Calcium (LIVALO) 1 MG TABS   Other Relevant Orders   Lipid panel   Comprehensive metabolic panel   Renal insufficiency    Check labs        Other Visit Diagnoses    Colon cancer screening       Relevant Orders   Fecal occult blood, imunochemical   Height loss       Relevant Orders   DG Bone Density      Follow-up: Return in about 6 months (around 01/28/2021), or if symptoms worsen or fail to improve, for annual exam, fasting.  Ann Held, DO

## 2020-07-31 NOTE — Assessment & Plan Note (Signed)
Check labs 

## 2020-07-31 NOTE — Assessment & Plan Note (Signed)
Well controlled, no changes to meds. Encouraged heart healthy diet such as the DASH diet and exercise as tolerated.  °

## 2020-08-01 ENCOUNTER — Other Ambulatory Visit: Payer: Self-pay | Admitting: Family Medicine

## 2020-08-01 DIAGNOSIS — E785 Hyperlipidemia, unspecified: Secondary | ICD-10-CM

## 2020-08-04 ENCOUNTER — Encounter: Payer: Self-pay | Admitting: Occupational Therapy

## 2020-08-04 ENCOUNTER — Ambulatory Visit: Payer: PPO | Admitting: Occupational Therapy

## 2020-08-04 ENCOUNTER — Encounter: Payer: Self-pay | Admitting: Family Medicine

## 2020-08-04 ENCOUNTER — Other Ambulatory Visit: Payer: Self-pay

## 2020-08-04 DIAGNOSIS — I69354 Hemiplegia and hemiparesis following cerebral infarction affecting left non-dominant side: Secondary | ICD-10-CM

## 2020-08-04 DIAGNOSIS — R29818 Other symptoms and signs involving the nervous system: Secondary | ICD-10-CM

## 2020-08-04 DIAGNOSIS — R278 Other lack of coordination: Secondary | ICD-10-CM

## 2020-08-04 DIAGNOSIS — I69318 Other symptoms and signs involving cognitive functions following cerebral infarction: Secondary | ICD-10-CM

## 2020-08-04 NOTE — Therapy (Signed)
[place]. [place], [name], [address] Phone: [phone]   Fax:  [phone]  Occupational Therapy Treatment  Patient Details  Name: William [name] MRN: [id number] Date of Birth: April 01, 1941 Referring Provider (OT): Roma Schanz, DO (PCP)   Encounter Date: 08/04/2020   OT End of Session - 08/04/20 1244    Visit Number 7    Number of Visits 13    Date for OT Re-Evaluation 08/29/20    Authorization Type [employer]    Progress Note Due on Visit 10    OT Start Time 1100    OT Stop Time 1144    OT Time Calculation (min) 44 min    Activity Tolerance Patient tolerated treatment well    Behavior During Therapy [facility] for tasks assessed/performed           Past Medical History:  Diagnosis Date  . Arthritis    low back  . 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 Bladder stone   . BPH (benign prostatic hyperplasia) 08/06/2007  . Carotid artery occlusion   . 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 Closed fracture of fifth metacarpal bone 05/15/2015  . Eczema   . Fasting hyperglycemia 12/21/2006  . GERD (gastroesophageal reflux disease)   . History of carotid artery stenosis    S/P BILATERAL CEA  . 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 HTN (hypertension) 07/19/2015  . Hyperlipidemia   . Hypertension   . 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 Nocturia   . Renal insufficiency 06/25/2013  . S/P carotid endarterectomy    BILATERAL ICA--  PATENT PER DUPLEX  05-19-2012  . 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 Urinary frequency   . Vitamin D deficiency     Past Surgical History:  Procedure Laterality Date  . APPENDECTOMY  AS CHILD  . CARDIOVASCULAR STRESS TEST  03-27-2012  DR CRENSHAW   LOW RISK LEXISCAN STUDY-- PROBABLE NORMAL PERFUSION AND SOFT TISSUE ATTENUATION/  NO  ISCHEMIA/ EF 51%  . CAROTID ENDARTERECTOMY Bilateral LEFT  11-12-2008  DR GREG HAYES   RIGHT ICA  2006  ([facility])  . CYSTOSCOPY WITH LITHOLAPAXY N/A 02/26/2013   Procedure: CYSTOSCOPY WITH LITHOLAPAXY;  Surgeon: Franchot Gallo, MD;  Location: [facility];  Service: Urology;  Laterality: N/A;  . EYE SURGERY  Jan. 2016   cataract surgery both eyes  . INGUINAL HERNIA REPAIR Right 11-08-2006  . MASS EXCISION N/A 03/03/2016   Procedure: EXCISION OF BACK  MASS;  Surgeon: Stark Klein, MD;  Location: [place];  Service: General;  Laterality: N/A;  . 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 PROSTATE SURGERY    . TRANSURETHRAL RESECTION OF PROSTATE N/A 02/26/2013   Procedure: TRANSURETHRAL RESECTION OF THE PROSTATE WITH GYRUS INSTRUMENTS;  Surgeon: Franchot Gallo, MD;  Location: [facility];  Service: Urology;  Laterality: N/A;    There were no vitals filed for this visit.   Subjective Assessment - 08/04/20 1240    Subjective  Pt reported "I could play this with [name] (wife) at home" during card game activity; per wife report, pt continues to experience difficulty with final step of donning zip-up jacket at home    Pertinent History R MCA infarct (2006)  Patient Stated Goals Increaing functional use of LUE    Currently in Pain? No/denies            OT Treatments/Exercises (OP) - 08/04/20 1251      ADLs   UB Dressing Mod I doffing zip-up jacket      Cognitive Exercises   Other Cognitive Exercises 1 OT played Go Fish with pt using oversized cards to facilitate bilateral coordination, FM control and coordination of L hand, attention to task, and recall and working memory   Pt required mod-max verbal cues to recall steps and rules of game after initial few turns; pt was able to incorporate L hand w/out cueing and demo'd adequate FM coordination while manipulating cards     Visual/Perceptual Exercises   Copy this Image Pegboard     Pegboard Attempted to copy simple pattern onto large pegboard   Pt required verbal and tactile cues for appropriate force gradation and to align pegs into pegboard; Min verbal and visual cues required to correctly copy pattern           OT Education - 08/04/20 1242    Education Details Education provided to spouse after session regarding cognitive limitations observed during session and methods of cueing that may be beneficial to implement at home during cognitive-based activities. OT also discussed this with pt to help facilitate awareness of deficits at this time.    Person(s) Educated Spouse;Patient    Methods Explanation    Comprehension Verbalized understanding            OT Short Term Goals - 07/28/20 1131      OT SHORT TERM GOAL #1   Title Pt will complete UB dressing with Min A at least 50% of the time    Baseline Mod A    Time 3    Period Weeks    Status On-going    Target Date 08/08/20      OT SHORT TERM GOAL #2   Title Pt will improve GMC for greater participation in LB dressing as evidenced by increasing Box and Blocks Test by at least 10 blocks    Baseline L hand (20 blocks); R hand (29 blocks)    Time 3    Period Weeks    Status On-going   L hand (23 blocks); R hand (36 blocks). Pt required v/c to continue task due to decreased working memory.     OT SHORT TERM GOAL #3   Title Pt will complete simple tabletop puzzle/maze with Min A at least 50% of the time to improve motor planning    Time 3    Period Weeks    Status On-going            OT Long Term Goals - 07/16/20 1411      OT LONG TERM GOAL #1   Title Pt will complete HEP designed for strengthening and functional use of LUE with Mod I and report carryover at home    Time 6    Period Weeks    Status On-going      OT LONG TERM GOAL #2   Title Pt will be able to complete LB dressing with Min A at least 50% of the time    Baseline Mod A donning pants    Time 6    Period Weeks    Status On-going       OT LONG TERM GOAL #3   Title Pt will improve FMC to increase functional use of UE  as evidenced by decreasing 9-HPT by at least 10 seconds with L hand    Time 6    Period Weeks    Status On-going      OT LONG TERM GOAL #4   Title Pt will complete a cognitive-based activity with Min A to improve executive functioning skills and safety during daily activities    Time 6    Period Weeks    Status On-going            Plan - 08/04/20 1245    Clinical Impression Statement Pt experienced difficulty with working memory, problem-solving, recall, and attention to task during multi-step card game activity compared with one-step board game completed in previous sessions. Pt demo'd increased ability to incorporate LUE during therapeutic activities w/ decreased cueing this session.    OT Occupational Profile and History Detailed Assessment- Review of Records and additional review of physical, cognitive, psychosocial history related to current functional performance    Occupational performance deficits (Please refer to evaluation for details): ADL's;IADL's;Leisure    Body Structure / Function / Physical Skills ADL;UE functional use;Body mechanics;Pain;FMC;GMC;Coordination;ROM;IADL;Strength;Dexterity    Cognitive Skills Attention;Problem Solve;Safety Awareness    Psychosocial Skills Environmental  Adaptations    Rehab Potential Good    Clinical Decision Making Multiple treatment options, significant modification of task necessary    Comorbidities Affecting Occupational Performance: May have comorbidities impacting occupational performance    Modification or Assistance to Complete Evaluation  Min-Moderate modification of tasks or assist with assess necessary to complete eval    OT Frequency 2x / week    OT Duration 6 weeks    OT Treatment/Interventions Self-care/ADL training;Visual/perceptual remediation/compensation;Patient/family education;Passive range of motion;Cryotherapy;Electrical  Stimulation;Contrast Bath;Coping strategies training;Moist Heat;Therapeutic exercise;Manual Therapy;Therapeutic activities;Neuromuscular education;Cognitive remediation/compensation    Plan Continue to address functional problem-solving, attention, and memory    Consulted and Agree with Plan of Care Patient;Family member/caregiver    Family Member Consulted Wife [name])           Patient will benefit from skilled therapeutic intervention in order to improve the following deficits and impairments:   Body Structure / Function / Physical Skills: ADL,UE functional use,Body mechanics,Pain,FMC,GMC,Coordination,ROM,IADL,Strength,Dexterity Cognitive Skills: Attention,Problem Solve,Safety Awareness Psychosocial Skills: Environmental  Adaptations   Visit Diagnosis: Hemiplegia and hemiparesis following cerebral infarction affecting left non-dominant side (HCC)  Other lack of coordination  Other symptoms and signs involving cognitive functions following cerebral infarction  Other symptoms and signs involving the nervous system    Problem List Patient Active Problem List   Diagnosis Date Noted  . Depression with anxiety 01/29/2020  . Leg pain, bilateral 01/29/2020  . Ingrown toenail 07/13/2019  . Lumbar spondylosis 05/02/2018  . Pain in left knee 03/09/2018  . 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 Trochanteric bursitis of left hip 01/16/2018  . Preventative health care 09/26/2017  . HTN (hypertension) 07/19/2015  . Hyperlipidemia 07/19/2015  . Great toe pain 02/11/2014  . 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 Obesity (BMI 30-39.9) 06/25/2013  . Renal insufficiency 06/25/2013  . Weakness of left arm 06/25/2013  . Sebaceous cyst 03/03/2011  . Sprain of lumbar region 07/31/2010  . Rib pain, left 08/29/2009  . Carotid artery stenosis, bilateral 05/02/2009  . Eczema, atopic 05/31/2008  . Vitamin D deficiency 03/01/2008  . BPH (benign prostatic hyperplasia) 08/06/2007   . Fasting hyperglycemia 12/21/2006  . History of right MCA infarct [address], OTR/L, MSOT 08/04/2020, 1:00 PM  [place]  [place]. [place], [name], [address] Phone: [phone]   Fax:  [phone]  Name: BRODRIC SCHAUER MRN: [id number] Date of Birth: 08/28/1940

## 2020-08-05 MED FILL — LISINOPRIL-HCTZ 10-12.5 MG: 10-12.5 | 30 days supply | Qty: 30 | Fill #4

## 2020-08-05 NOTE — Telephone Encounter (Signed)
No -- don't take supplement for calcium Most the meds for bones can hurt the kidneys as well === when do they see nephrology again.   We may need to ask them for help with this

## 2020-08-06 ENCOUNTER — Ambulatory Visit: Payer: PPO | Admitting: Occupational Therapy

## 2020-08-06 ENCOUNTER — Encounter: Payer: Self-pay | Admitting: Occupational Therapy

## 2020-08-06 ENCOUNTER — Other Ambulatory Visit: Payer: Self-pay

## 2020-08-06 DIAGNOSIS — I69354 Hemiplegia and hemiparesis following cerebral infarction affecting left non-dominant side: Secondary | ICD-10-CM

## 2020-08-06 DIAGNOSIS — R278 Other lack of coordination: Secondary | ICD-10-CM

## 2020-08-06 DIAGNOSIS — R29818 Other symptoms and signs involving the nervous system: Secondary | ICD-10-CM

## 2020-08-06 DIAGNOSIS — I69318 Other symptoms and signs involving cognitive functions following cerebral infarction: Secondary | ICD-10-CM

## 2020-08-06 NOTE — Therapy (Signed)
[place]. [place], [name], [address] Phone: [phone]   Fax:  [phone]  Occupational Therapy Treatment  Patient Details  Name: William [name] MRN: [id number] Date of Birth: [birth year] Referring Provider (OT): Roma Schanz, DO (PCP)   Encounter Date: 08/06/2020   OT End of Session - 08/06/20 1652    Visit Number 8    Number of Visits 13    Date for OT Re-Evaluation 08/29/20    Authorization Type [employer]    Progress Note Due on Visit 10    OT Start Time 1314    OT Stop Time 1358    OT Time Calculation (min) 44 min    Activity Tolerance Patient tolerated treatment well    Behavior During Therapy [facility] for tasks assessed/performed           Past Medical History:  Diagnosis Date  . Arthritis    low back  . 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 Bladder stone   . BPH (benign prostatic hyperplasia) 08/06/2007  . Carotid artery occlusion   . 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 Closed fracture of fifth metacarpal bone 05/15/2015  . Eczema   . Fasting hyperglycemia 12/21/2006  . GERD (gastroesophageal reflux disease)   . History of carotid artery stenosis    S/P BILATERAL CEA  . 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 HTN (hypertension) 07/19/2015  . Hyperlipidemia   . Hypertension   . 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 Nocturia   . Renal insufficiency 06/25/2013  . S/P carotid endarterectomy    BILATERAL ICA--  PATENT PER DUPLEX  05-19-2012  . 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 Urinary frequency   . Vitamin D deficiency     Past Surgical History:  Procedure Laterality Date  . APPENDECTOMY  AS CHILD  . CARDIOVASCULAR STRESS TEST  03-27-2012  DR CRENSHAW   LOW RISK LEXISCAN STUDY-- PROBABLE NORMAL PERFUSION AND SOFT TISSUE ATTENUATION/  NO  ISCHEMIA/ EF 51%  . CAROTID ENDARTERECTOMY Bilateral LEFT  11-12-2008  DR GREG HAYES   RIGHT ICA  2006  ([facility])  . CYSTOSCOPY WITH LITHOLAPAXY N/A 02/26/2013   Procedure: CYSTOSCOPY WITH LITHOLAPAXY;  Surgeon: Franchot Gallo, MD;  Location[facility];  Service: Urology;  Laterality: N/A;  . EYE SURGERY  Jan. 2016   cataract surgery both eyes  . INGUINAL HERNIA REPAIR Right 11-08-2006  . MASS EXCISION N/A 03/03/2016   Procedure: EXCISION OF BACK  MASS;  Surgeon: Stark Klein, MD;  Location: [place];  Service: General;  Laterality: N/A;  . 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 PROSTATE SURGERY    . TRANSURETHRAL RESECTION OF PROSTATE N/A 02/26/2013   Procedure: TRANSURETHRAL RESECTION OF THE PROSTATE WITH GYRUS INSTRUMENTS;  Surgeon: Franchot Gallo, MD;  Location: [facility];  Service: Urology;  Laterality: N/A;    There were no vitals filed for this visit.   Subjective Assessment - 08/06/20 1649    Subjective  Pt stated "I'm lost and don't know whose turn it is" during Go Fish, but after playing, reported he liked having the cards laid out in front of him and that remember the steps of the game felt easier this session.  Pertinent History R MCA infarct (2006)    Patient Stated Goals Increaing functional use of LUE    Currently in Pain? No/denies            OT Treatments/Exercises (OP) - 08/06/20 1323      ADLs   UB Dressing Mod I doffing and donning zip-up jacket      Cognitive Exercises   Other Cognitive Exercises 1 OT played Go Fish with pt using standard playing cards to facilitate attention to task, recall, working memory, and deduction   OT instructed pt to lay cards out on table instead of holding his hand to improve participation with success. Pt required mod verbal cues to remember turns and to cue him on which card he asked for during previous turn     Visual/Perceptual Exercises   Copy this Image  Pegboard    Pegboard Attempted to copy 2 pattern onto large pegboard (1 easy, 1 med) using L hand for easy pattern and R hand for med-level pattern   Pt required verbal and tactile cues for appropriate force gradation and to align pegs into pegboard w/ L hand; Mod verbal and visual cues required to correctly copy medium-level pattern           OT Short Term Goals - 07/28/20 1131      OT SHORT TERM GOAL #1   Title Pt will complete UB dressing with Min A at least 50% of the time    Baseline Mod A    Time 3    Period Weeks    Status On-going    Target Date 08/08/20      OT SHORT TERM GOAL #2   Title Pt will improve GMC for greater participation in LB dressing as evidenced by increasing Box and Blocks Test by at least 10 blocks    Baseline L hand (20 blocks); R hand (29 blocks)    Time 3    Period Weeks    Status On-going   L hand (23 blocks); R hand (36 blocks). Pt required v/c to continue task due to decreased working memory.     OT SHORT TERM GOAL #3   Title Pt will complete simple tabletop puzzle/maze with Min A at least 50% of the time to improve motor planning    Time 3    Period Weeks    Status On-going             OT Long Term Goals - 07/16/20 1411      OT LONG TERM GOAL #1   Title Pt will complete HEP designed for strengthening and functional use of LUE with Mod I and report carryover at home    Time 6    Period Weeks    Status On-going      OT LONG TERM GOAL #2   Title Pt will be able to complete LB dressing with Min A at least 50% of the time    Baseline Mod A donning pants    Time 6    Period Weeks    Status On-going      OT LONG TERM GOAL #3   Title Pt will improve [place] to increase functional use of UE as evidenced by decreasing 9-HPT by at least 10 seconds with L hand    Time 6    Period Weeks    Status On-going      OT LONG TERM GOAL #4   Title Pt will complete a cognitive-based activity with Min A  to improve executive functioning skills and safety  during daily activities    Time 6    Period Weeks    Status On-going             Plan - 08/06/20 1653    Clinical Impression Statement Pt experienced significant difficulty following both easy and medium-level pegboard patterns, requiring verbal and visual cues throughout and demo'd improved ability to recall steps/rules of Go Fish this session compared with previous session.    OT Occupational Profile and History Detailed Assessment- Review of Records and additional review of physical, cognitive, psychosocial history related to current functional performance    Occupational performance deficits (Please refer to evaluation for details): ADL's;IADL's;Leisure    Body Structure / Function / Physical Skills ADL;UE functional use;Body mechanics;Pain;FMC;GMC;Coordination;ROM;IADL;Strength;Dexterity    Cognitive Skills Attention;Problem Solve;Safety Awareness    Psychosocial Skills Environmental  Adaptations    Rehab Potential Good    Clinical Decision Making Multiple treatment options, significant modification of task necessary    Comorbidities Affecting Occupational Performance: May have comorbidities impacting occupational performance    Modification or Assistance to Complete Evaluation  Min-Moderate modification of tasks or assist with assess necessary to complete eval    OT Frequency 2x / week    OT Duration 6 weeks    OT Treatment/Interventions Self-care/ADL training;Visual/perceptual remediation/compensation;Patient/family education;Passive range of motion;Cryotherapy;Electrical Stimulation;Contrast Bath;Coping strategies training;Moist Heat;Therapeutic exercise;Manual Therapy;Therapeutic activities;Neuromuscular education;Cognitive remediation/compensation    Plan Continue to address functional problem-solving, attention, and memory; Address LB dressing    Consulted and Agree with Plan of Care Patient;Family member/caregiver    Family Member Consulted Wife [name])           Patient  will benefit from skilled therapeutic intervention in order to improve the following deficits and impairments:   Body Structure / Function / Physical Skills: ADL,UE functional use,Body mechanics,Pain,FMC,GMC,Coordination,ROM,IADL,Strength,Dexterity Cognitive Skills: Attention,Problem Solve,Safety Awareness Psychosocial Skills: Environmental  Adaptations   Visit Diagnosis: Hemiplegia and hemiparesis following cerebral infarction affecting left non-dominant side (HCC)  Other lack of coordination  Other symptoms and signs involving cognitive functions following cerebral infarction  Other symptoms and signs involving the nervous system    Problem List Patient Active Problem List   Diagnosis Date Noted  . Depression with anxiety 01/29/2020  . Leg pain, bilateral 01/29/2020  . Ingrown toenail 07/13/2019  . Lumbar spondylosis 05/02/2018  . Pain in left knee 03/09/2018  . 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 Trochanteric bursitis of left hip 01/16/2018  . Preventative health care 09/26/2017  . HTN (hypertension) 07/19/2015  . Hyperlipidemia 07/19/2015  . Great toe pain 02/11/2014  . 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 Obesity (BMI 30-39.9) 06/25/2013  . Renal insufficiency 06/25/2013  . Weakness of left arm 06/25/2013  . Sebaceous cyst 03/03/2011  . Sprain of lumbar region 07/31/2010  . Rib pain, left 08/29/2009  . Carotid artery stenosis, bilateral 05/02/2009  . Eczema, atopic 05/31/2008  . Vitamin D deficiency 03/01/2008  . BPH (benign prostatic hyperplasia) 08/06/2007  . Fasting hyperglycemia 12/21/2006  . History of right MCA infarct [address] OTR/L, MSOT 08/06/2020, 4:59 PM  [place]. [place], [name], [address] Phone: [phone]   Fax:  [phone]  Name: William [name] MRN: [id number] Date of Birth: Jul 02, 1940

## 2020-08-06 NOTE — Progress Notes (Signed)
NEUROLOGY FOLLOW UP OFFICE NOTE  William [name] [id number]  Assessment/Plan:   1.  New headaches 2.  Vascular major neurocognitive disorder 3.  Hemiplegia as late effect of CVA 4.  Lumbar spondylosis 5.  Hypertension 6.  Hyperlipidemia  1.  Due to new headache, will check MRI of brain 2.  If headaches persist for next month, contact me and we can start a preventative. 3.  Secondary stroke prevention as managed by PCP: -  ASA 325mg  daily - Blood pressure control - Glycemic control.  Hgb A1c goal less than 7 -  LDL goal less than 70.  Unable to tolerate statin.  Consider Repatha 4.  Mediterranean diet 5.  Follow up 6 months.  Subjective:  William [name] is a 80year old manwith dementia, CKD, HTN, CAD, HLD and history of CVAwho follows up for dementia.Marland KitchenHe is accompanied by his wife who supplements history.  UPDATE: Current medications: ASA 325mg ; amlodipine, Tegretol-XR 100mg  at bedtime; lorazepam PRN; tramadol 50mg  Q8h PRN   Off statin due to intolerance.  LDL from 07/31/2020 was 133.  Just started pitavasgtatin 1mg .  If unable to tolerate, plan may be referral to cardiology for [place].  He reports moderate non-throbbing headache back of head every night.  Ongoing for two months.  No neck pain.  It occurs in the evening while sitting talking or watching TV.  Treats with Tylenol almost everyday.  Lasts a half hour.  No change in medication just prior to onset.  Similar headache when he had his stroke.     HISTORY: 1. Bilateral Leg Pain &Weakness: Prior to COVID, he was going to the gym and seeing a personal trainer but it stopped once the pandemic hit. Since the pandemic, he has been sedentary. He walks the dog once or twice a week. He started having bilateral leg painlater that year in 2020. No associated back pain. No shooting pain. It is described as an aching in his calves. No associated numbness. He also reports weakness. When he walks, he has trouble  picking up his legs. He seems to shuffle his feet. When he gets up to stand, he feels like he can fall forward. He had one fall while standing at the toilet and he bent forward to spit in toilet. No change in bowel habit. He was having urinary issues so he was taken off of Flomax. Vascular US ABI from 12/08/18 showed abnormal toe-brachial index in both lower extremities but otherwise unremarkable.MRI of cervical spine without contrast from 03/10/2019 showed some cervical degenerative disc disease and spondylosis with mild foraminal narrowing but no spinal stenosis or compressive myelopathy.  He does have history of low back painand lumbar spondylosis.Due to ongoing unsteady gait with bilateral leg pain, MRI of lumbar spine was performed on 11/14/2019, which was personally reviewed and demonstrated degenerative changes stable compared to 2012 with L3-4 right subarticular stenosis.  He has had mixed response to epidural injections.   2. Dementia: He had a stroke in 2006, after experiencing left facial droop, left arm and leg weakness, as well slurred vision and vision problems. 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 He has some residual left sided weakness and facial weakness. He underwent left carotid endarterectomy in 2010 for asymptomatic stenosis.  Beginning around 2013, he and his wife have noted a gradual progression and cognitive deficits. Onset correlated during a period when he was experiencing dizziness and syncope secondary to orthostatic hypotension. At that time, he was dehydrated  and not drinking much fluids. He was instructed to start increasing his fluid intake as well as his sodium intake. Now, he tries to drink 3 bottles of water per day. He particularly notes confusion regarding how to perform certain everyday tasks. For example, it takes him a long time to unload the dishwasher because he has trouble figuring out where to put the dishes.  He use to enjoy cooking and preparing meals. Now he has difficulty cooking and can only see things up in the microwave. He also has been experiencing dressing apraxia. Sometimes he will put both his feet into one leg of his pants. Other times, he has difficulty buttoning his shirts. He denies any language dysfunction such as difficulty understanding other people or other people not understanding him. He has no trouble reading or writing. His short-term and long-term memory are pretty good. He denies any difficulty with face recognition. He denies hallucinations or delusions. He has not had any change in his personality or behavior. He still drives but very seldom and only locally. He has not had any problems with accidents or near accidents, but he says he drives slowly because he is very nervous and cautious. He does have history of anxiety and often shakes when he gets nervous. He denies any family history of dementia. However, there is a psychiatric history with his mother and sister. He denies history of alcohol abuse or drug use.  He underwent neuropsychological testing on 10/25/13 with Dr. Macarthur Critchley at [facility] suggested mild dementia due to his stroke, but also underlying neurodegenerative process. Memory and left hemispheric medial temporal functions were intact. Findings showed deficits in visual processing and executive dysfunction. Onset of visual-spatial deficits and apraxia may suggest occipital lobe involvement, such as due to an Alzheimer's variant called posterior cortical atrophy. MRI from May 2015 revealed global atrophy but it does not seem more prominent in the occipital lobes.  He underwent repeat neuropsychological testing on 11/29/2019 which demonstrated findings consistent with major vascular neurocognitive disorder, relatively stable compared to prior testing in 2015.    He developed increased confusion in February 2021. At physical therapy, he was noted to have  increased difficulty using his left side (affected side from his stroke). He had difficulty performing finger-thumb tapping and had trouble gripping the handle of the exercise machine with his left hand. He also has had acute cognitive changes. He started having trouble dialing his daughter's phone number which is new.MRI of brain without contrast performed on 08/15/2019 was personally reviewed and showedremote large right hemispheric stroke andchronic small vessel ischemic changes, stable compared to prior MRI from 2015, no acute intracranial abnormality  Prior medication: Aricept (diarrhea, vivid dreams); galantamine (nausea)  01/29/14 Carotid doppler:  1-39% bilateral ICA stenosis. 10/16/13 LABS: B12 720, TSH 1.668 10/19/13 MRI BRAIN WO: stable remote large right MCA territory infarct. Chronic microvascular ischemic changes. 05/21/13 LABS: Hgb A1c 6 05/17/13 Carotid doppler: bilateral 1-39% stenosis of the right proximal ICA and no stenosis of the left ICA. 03/11/12 MRI Brain wo contrast: Remote large right MCA infarct. Dementia with behavioral symptoms. Etiology unclear. 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 Arthritis    low back  . 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 Bladder stone   . BPH (benign prostatic hyperplasia) 08/06/2007  .  Carotid artery occlusion   . 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 Closed fracture of fifth metacarpal bone 05/15/2015  . Eczema   . Fasting hyperglycemia 12/21/2006  . GERD (gastroesophageal reflux disease)   . History of carotid artery stenosis    S/P BILATERAL CEA  . 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 HTN (hypertension) 07/19/2015  . Hyperlipidemia   . Hypertension   . 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   Mild,  related to stroke history  . Nocturia   . Renal insufficiency 06/25/2013  . S/P carotid endarterectomy    BILATERAL ICA--  PATENT PER DUPLEX  05-19-2012  . 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 Urinary frequency   . Vitamin D deficiency     MEDICATIONS: Current Outpatient Medications on File Prior to Visit  Medication Sig Dispense Refill  . acetaminophen (TYLENOL) 500 MG tablet Take 500 mg by mouth every 6 (six) hours as needed (1 tab as needed).    [name] amLODipine (NORVASC) 5 MG tablet Take 1 tablet (5 mg total) by mouth daily. (Patient taking differently: Take 10 mg by mouth daily.) 90 tablet 1  . aspirin 325 MG EC tablet Take 325 mg by mouth daily.    . carbamazepine (TEGRETOL-XR) 100 MG 12 hr tablet 1 qam  2  qhs 270 tablet 1  . Cholecalciferol (VITAMIN D) 2000 UNITS CAPS Take by mouth.    . docusate sodium (COLACE) 100 MG capsule Take 100 mg by mouth daily.    . Eszopiclone 3 MG TABS TAKE 1 TABLET BY MOUTH AT BEDTIME AS NEEDED TAKE IMMEDIATELY BEFORE BEDTIME 90 tablet 2  . famotidine (PEPCID) 20 MG tablet Take 20 mg by mouth 2 (two) times daily.    . fenofibrate 160 MG tablet TAKE 1 TABLET (160 MG TOTAL) BY MOUTH DAILY. 90 tablet 1  . Flaxseed, Linseed, (FLAX SEED OIL PO) Take by mouth daily.    . fluticasone (CUTIVATE) 0.05 % cream   3  . lisinopril-hydrochlorothiazide (ZESTORETIC) 10-12.5 MG tablet Take 1 tablet by mouth daily.    [name] LORazepam (ATIVAN) 0.5 MG tablet 2  Bid  1 as needed 150 tablet 5  . Multiple Vitamin (MULTIVITAMIN) tablet Take 1 tablet by mouth daily.    . NONFORMULARY OR COMPOUNDED ITEM [facility]:  Onychomycosis Nail lacquer - Fluconazole 2%, Terbinafine 1%, DMSO, apply to affected area daily. 120 each 2  . NONFORMULARY OR COMPOUNDED ITEM [facility]:  Antifungal topical - Terbinafine 3%, Fluconazole 2%, Tea Tree Oil 5%, Urea 10/%,, Ibuprofen 2% in DMSO Suspension #70ml. Apply to the affected  nail(s) once (at bedtime) or twice daily. 100 each 2  . NONFORMULARY OR COMPOUNDED ITEM Compression socks  #1  Dx low ext edema and ache 1 each 0  . ondansetron (ZOFRAN ODT) 4 MG disintegrating tablet Take 1 tablet (4 mg total) by mouth every 8 (eight) hours as needed for nausea or vomiting. 20 tablet 0  . Pitavastatin Calcium (LIVALO) 1 MG TABS 1 po qd 30 tablet 2  . tamsulosin (FLOMAX) 0.4 MG CAPS capsule TAKE 1 CAPSULE BY MOUTH ONCE DAILY 90 capsule 3  . traMADol (ULTRAM) 50 MG tablet Take 1 tablet (50 mg total) by mouth every 8 (eight) hours as needed. 30 tablet 0   No current facility-administered medications on file prior to visit.    ALLERGIES: Allergies  Allergen Reactions  . Other Anaphylaxis  .  Strawberry Extract Hives  . Adhesive [Tape] Other (See Comments)    BLISTER  . Latex     Reaction to Plastic Tape  . Statins Other (See Comments)    myalgias  . Strawberry (Diagnostic) Hives  . Zetia [Ezetimibe] Other (See Comments)    Intolerance     FAMILY HISTORY: Family History  Problem Relation Age of Onset  . Heart disease Mother        CHF  . Bipolar disorder Mother   . Heart disease Father        CHF   Objective:  Blood pressure 133/64, pulse 61, height 5\' 9"  (1.753 m), weight 216 lb (98 kg), SpO2 99 %. General: No acute distress.  Patient appears well-groomed.   Head:  Normocephalic/atraumatic Eyes:  Fundi examined but not visualized Neck: supple, no paraspinal tenderness, full range of motion Heart:  Regular rate and rhythm Lungs:  Clear to auscultation bilaterally Back: No paraspinal tenderness Neurological [name]:  William [name] 08/07/2020  Weekday Correct 1  Current year 1  What state are we in? 1  Amount spent 1  Amount left 2  # of Animals 1  5 objects recall 5  Number series 1  Hour markers 0  Time correct 0  Placed X in triangle correctly 1  Largest Figure 1  Name of male 2  Date back to work 0  Type of work 2  State she  lived in 2  Total score 21  Left sided neglect/extinction to double simultaneous tactile stimulation. Left lower homonymous quadrantanopia.  Otherwise, CN II-XII intact. Bulk and tone normal, muscle strength 5-/5 left deltoid and hip flexion, otherwise 5/5 throughout.  Sensation to light touch  intact.  Deep tendon reflexes 2+ throughout, toes downgoing.  Finger to nose  testing intact.  Mild shuffling gait.  Romberg with sway.  Metta Clines, DO  CC: Roma Schanz, DO

## 2020-08-07 ENCOUNTER — Encounter: Payer: Self-pay | Admitting: Neurology

## 2020-08-07 ENCOUNTER — Ambulatory Visit: Payer: PPO | Admitting: Neurology

## 2020-08-07 VITALS — BP 133/64 | HR 61 | Ht 69.0 in | Wt 216.0 lb

## 2020-08-07 DIAGNOSIS — Z8673 Personal history of transient ischemic attack (TIA), and cerebral infarction without residual deficits: Secondary | ICD-10-CM | POA: Diagnosis not present

## 2020-08-07 DIAGNOSIS — F0390 Unspecified dementia without behavioral disturbance: Secondary | ICD-10-CM | POA: Diagnosis not present

## 2020-08-07 DIAGNOSIS — R519 Headache, unspecified: Secondary | ICD-10-CM | POA: Diagnosis not present

## 2020-08-07 NOTE — Patient Instructions (Addendum)
1.  Monitor headache.  If not improved in 4 weeks, contact me 2.  Limit use of pain relievers to no more than 2 days out of week to prevent risk of rebound or medication-overuse headache. 3.  Will check MRI of brain without contrast. We have sent a referral to [facility] for your MRI and they will call you directly to schedule your appointment. They are located at [place]. If you need to contact them directly please call [phone].

## 2020-08-11 ENCOUNTER — Ambulatory Visit: Payer: PPO | Admitting: Occupational Therapy

## 2020-08-11 ENCOUNTER — Encounter: Payer: Self-pay | Admitting: Occupational Therapy

## 2020-08-11 ENCOUNTER — Other Ambulatory Visit: Payer: Self-pay

## 2020-08-11 DIAGNOSIS — R29818 Other symptoms and signs involving the nervous system: Secondary | ICD-10-CM

## 2020-08-11 DIAGNOSIS — I69318 Other symptoms and signs involving cognitive functions following cerebral infarction: Secondary | ICD-10-CM

## 2020-08-11 DIAGNOSIS — R278 Other lack of coordination: Secondary | ICD-10-CM

## 2020-08-11 DIAGNOSIS — I69354 Hemiplegia and hemiparesis following cerebral infarction affecting left non-dominant side: Secondary | ICD-10-CM

## 2020-08-11 NOTE — Therapy (Signed)
[facility] [place], [name], [address] Phone: [phone]   Fax:  [phone]  Occupational Therapy Treatment  Patient Details  Name: William [name] MRN: [id number] Date of Birth: 16-Jul-1940 Referring Provider (OT): Roma Schanz, DO (PCP)   Encounter Date: 08/11/2020   OT End of Session - 08/11/20 1742    Visit Number 9    Number of Visits 13    Date for OT Re-Evaluation 08/29/20    Authorization Type [employer]    Progress Note Due on Visit 10    OT Start Time 1400    OT Stop Time 1444    OT Time Calculation (min) 44 min    Activity Tolerance Patient tolerated treatment well    Behavior During Therapy [facility] for tasks assessed/performed           Past Medical History:  Diagnosis Date  . Arthritis    low back  . 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 Bladder stone   . BPH (benign prostatic hyperplasia) 08/06/2007  . Carotid artery occlusion   . 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 Closed fracture of fifth metacarpal bone 05/15/2015  . Eczema   . Fasting hyperglycemia 12/21/2006  . GERD (gastroesophageal reflux disease)   . History of carotid artery stenosis    S/P BILATERAL CEA  . 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 HTN (hypertension) 07/19/2015  . Hyperlipidemia   . Hypertension   . 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 Nocturia   . Renal insufficiency 06/25/2013  . S/P carotid endarterectomy    BILATERAL ICA--  PATENT PER DUPLEX  05-19-2012  . 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 Urinary frequency   . Vitamin D deficiency     Past Surgical History:  Procedure Laterality Date  . APPENDECTOMY  AS CHILD  . CARDIOVASCULAR STRESS TEST  03-27-2012  DR CRENSHAW   LOW RISK LEXISCAN STUDY-- PROBABLE NORMAL PERFUSION AND SOFT TISSUE ATTENUATION/  NO  ISCHEMIA/ EF 51%  . CAROTID ENDARTERECTOMY Bilateral LEFT  11-12-2008  DR GREG HAYES   RIGHT ICA  2006  ([facility])  . CYSTOSCOPY WITH LITHOLAPAXY N/A 02/26/2013   Procedure: CYSTOSCOPY WITH LITHOLAPAXY;  Surgeon: Franchot Gallo, MD;  Location: [facility];  Service: Urology;  Laterality: N/A;  . EYE SURGERY  Jan. 2016   cataract surgery both eyes  . INGUINAL HERNIA REPAIR Right 11-08-2006  . MASS EXCISION N/A 03/03/2016   Procedure: EXCISION OF BACK  MASS;  Surgeon: Stark Klein, MD;  Location: [facility];  Service: General;  Laterality: N/A;  . 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 PROSTATE SURGERY    . TRANSURETHRAL RESECTION OF PROSTATE N/A 02/26/2013   Procedure: TRANSURETHRAL RESECTION OF THE PROSTATE WITH GYRUS INSTRUMENTS;  Surgeon: Franchot Gallo, MD;  Location: [facility];  Service: Urology;  Laterality: N/A;    There were no vitals filed for this visit.   Subjective Assessment - 08/11/20 1742    Subjective  Pt states he played Go Fish w/ his wife of the weekend and that he enjoyed it    Pertinent History R MCA infarct (2006)    Patient Stated Goals Increaing functional use of LUE    Currently  in Pain? No/denies            OT Treatments/Exercises (OP) - 08/11/20 1412      ADLs   UB Dressing Mod I donning zip-up jacket   Pt able to complete all steps of donning zip-up jacket w/out cueing this session     Cognitive Exercises   Sequencing Skills One factor figural sequencing activity; after initial instruction, pt completed 2/4 trials independently and required Mod A w/ coaching a breakdown of task for other 2/4 trials      Shoulder Exercises: Seated   Extension Strengthening;Both;10 reps;Weights   Completed sitting EOM   Extension Weight (lbs) 3 lb dowel rod    Row Strengthening;Both;15 reps;Weights    Row Weight (lbs) 3 lb dowel rod    Horizontal ABduction Strengthening;Both;10 reps;Weights    Horizontal  ABduction Weight (lbs) 3 lb dowel rod    Flexion Strengthening;Both;15 reps;Weights    Flexion Weight (lbs) 3 lb dowel rod    Diagonals Strengthening;Both;10 reps;Weights    Diagonals Weight (lbs) 3 lb dowel rod      Hand Exercises   Other Hand Exercises Connect 4 used to facilitate executive function and functional LUE use; OT elevated tabletop surface/game base to facilitate functional overhead reach   Pt demo'd improved attention and working memory during game this session, requiring 1 visual/verbal cue throughout activity            OT Short Term Goals - 08/11/20 1746      OT SHORT TERM GOAL #1   Title Pt will complete UB dressing with Min A at least 50% of the time    Baseline Mod A    Time 3    Period Weeks    Status Achieved   08/11/20 - pt able to consistently doff UB clothing w/ Mod I and don shirts w/ Min A to Mod I, per observation and wife's report   Target Date 08/08/20      OT SHORT TERM GOAL #2   Title Pt will improve GMC for greater participation in LB dressing as evidenced by increasing Box and Blocks Test by at least 10 blocks    Baseline L hand (20 blocks); R hand (29 blocks)    Time 3    Period Weeks    Status On-going   L hand (23 blocks); R hand (36 blocks). Pt required v/c to continue task due to decreased working memory.     OT SHORT TERM GOAL #3   Title Pt will complete simple tabletop puzzle/maze with Min A at least 50% of the time to improve motor planning    Time 3    Period Weeks    Status On-going             OT Long Term Goals - 07/16/20 1411      OT LONG TERM GOAL #1   Title Pt will complete HEP designed for strengthening and functional use of LUE with Mod I and report carryover at home    Time 6    Period Weeks    Status On-going      OT LONG TERM GOAL #2   Title Pt will be able to complete LB dressing with Min A at least 50% of the time    Baseline Mod A donning pants    Time 6    Period Weeks    Status On-going      OT LONG  TERM GOAL #3   Title Pt will improve  [place] to increase functional use of UE as evidenced by decreasing 9-HPT by at least 10 seconds with L hand    Time 6    Period Weeks    Status On-going      OT LONG TERM GOAL #4   Title Pt will complete a cognitive-based activity with Min A to improve executive functioning skills and safety during daily activities    Time 6    Period Weeks    Status On-going            Plan - 08/11/20 1743    Clinical Impression Statement Pt demo'd significant improvement w/ Connect 4 activity this session, requiring less cueing to follow rules/goal of game and identifying appropriate game moves. OT graded activity up to facilitate functional overhead reach and pt was able to complete functional reach using LUE w/out difficulty. Pt also exhibited improvement w/ UB dressing this session, demonstrating Mod I donning zip-up jacket at conclusion of session.    OT Occupational Profile and History Detailed Assessment- Review of Records and additional review of physical, cognitive, psychosocial history related to current functional performance    Occupational performance deficits (Please refer to evaluation for details): ADL's;IADL's;Leisure    Body Structure / Function / Physical Skills ADL;UE functional use;Body mechanics;Pain;FMC;GMC;Coordination;ROM;IADL;Strength;Dexterity    Cognitive Skills Attention;Problem Solve;Safety Awareness    Psychosocial Skills Environmental  Adaptations    Rehab Potential Good    Clinical Decision Making Multiple treatment options, significant modification of task necessary    Comorbidities Affecting Occupational Performance: May have comorbidities impacting occupational performance    Modification or Assistance to Complete Evaluation  Min-Moderate modification of tasks or assist with assess necessary to complete eval    OT Frequency 2x / week    OT Duration 6 weeks    OT Treatment/Interventions Self-care/ADL training;Visual/perceptual  remediation/compensation;Patient/family education;Passive range of motion;Cryotherapy;Electrical Stimulation;Contrast Bath;Coping strategies training;Moist Heat;Therapeutic exercise;Manual Therapy;Therapeutic activities;Neuromuscular education;Cognitive remediation/compensation    Plan LB dressing and donning/doffing shoes    Consulted and Agree with Plan of Care Patient;Family member/caregiver    Family Member Consulted Wife [name])           Patient will benefit from skilled therapeutic intervention in order to improve the following deficits and impairments:   Body Structure / Function / Physical Skills: ADL,UE functional use,Body mechanics,Pain,FMC,GMC,Coordination,ROM,IADL,Strength,Dexterity Cognitive Skills: Attention,Problem Solve,Safety Awareness Psychosocial Skills: Environmental  Adaptations   Visit Diagnosis: Hemiplegia and hemiparesis following cerebral infarction affecting left non-dominant side (HCC)  Other lack of coordination  Other symptoms and signs involving cognitive functions following cerebral infarction  Other symptoms and signs involving the nervous system    Problem List Patient Active Problem List   Diagnosis Date Noted  . Depression with anxiety 01/29/2020  . Leg pain, bilateral 01/29/2020  . Ingrown toenail 07/13/2019  . Lumbar spondylosis 05/02/2018  . Pain in left knee 03/09/2018  . 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 Trochanteric bursitis of left hip 01/16/2018  . Preventative health care 09/26/2017  . HTN (hypertension) 07/19/2015  . Hyperlipidemia 07/19/2015  . Great toe pain 02/11/2014  . 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 Obesity (BMI 30-39.9) 06/25/2013  . Renal insufficiency 06/25/2013  . Weakness of left arm 06/25/2013  . Sebaceous cyst 03/03/2011  . Sprain of lumbar region 07/31/2010  . Rib pain, left 08/29/2009  . Carotid artery stenosis, bilateral 05/02/2009  . Eczema, atopic 05/31/2008  . Vitamin D  deficiency 03/01/2008  . BPH (benign prostatic hyperplasia) 08/06/2007  . Fasting hyperglycemia 12/21/2006  .  History of right MCA infarct [address], OTR/L, MSOT 08/11/2020, 5:51 PM  [facility] [place] [place], [name], [address] Phone: [phone]   Fax:  [phone]  Name: William [name] MRN: [id number] Date of Birth: [birth year]

## 2020-08-12 MED FILL — carBAMazepine ER 100 MG TB1: 100 | 90 days supply | Qty: 270 | Fill #1

## 2020-08-13 ENCOUNTER — Other Ambulatory Visit: Payer: Self-pay

## 2020-08-13 ENCOUNTER — Ambulatory Visit: Payer: PPO | Attending: Family Medicine | Admitting: Occupational Therapy

## 2020-08-13 ENCOUNTER — Encounter: Payer: Self-pay | Admitting: Occupational Therapy

## 2020-08-13 DIAGNOSIS — R278 Other lack of coordination: Secondary | ICD-10-CM

## 2020-08-13 DIAGNOSIS — R29818 Other symptoms and signs involving the nervous system: Secondary | ICD-10-CM | POA: Insufficient documentation

## 2020-08-13 DIAGNOSIS — I69354 Hemiplegia and hemiparesis following cerebral infarction affecting left non-dominant side: Secondary | ICD-10-CM | POA: Diagnosis not present

## 2020-08-13 DIAGNOSIS — I69318 Other symptoms and signs involving cognitive functions following cerebral infarction: Secondary | ICD-10-CM

## 2020-08-13 NOTE — Therapy (Signed)
[place] [place], [name], [address] Phone: [phone]   Fax:  [phone]  Occupational Therapy Treatment  Patient Details  Name: William [name] MRN: [id number] Date of Birth: 1941/04/17 Referring Provider (OT): Roma Schanz, DO (PCP)   Encounter Date: 08/13/2020   OT End of Session - 08/13/20 1121    Visit Number 10    Number of Visits 13    Date for OT Re-Evaluation 08/29/20    Authorization Type [employer]    Progress Note Due on Visit 10    OT Start Time 1102    OT Stop Time 1146    OT Time Calculation (min) 44 min    Activity Tolerance Patient tolerated treatment well    Behavior During Therapy [facility] for tasks assessed/performed           Past Medical History:  Diagnosis Date  . Arthritis    low back  . 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 Bladder stone   . BPH (benign prostatic hyperplasia) 08/06/2007  . Carotid artery occlusion   . 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 Closed fracture of fifth metacarpal bone 05/15/2015  . Eczema   . Fasting hyperglycemia 12/21/2006  . GERD (gastroesophageal reflux disease)   . History of carotid artery stenosis    S/P BILATERAL CEA  . 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 HTN (hypertension) 07/19/2015  . Hyperlipidemia   . Hypertension   . 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 Nocturia   . Renal insufficiency 06/25/2013  . S/P carotid endarterectomy    BILATERAL ICA--  PATENT PER DUPLEX  05-19-2012  . 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 Urinary frequency   . Vitamin D deficiency     Past Surgical History:  Procedure Laterality Date  . APPENDECTOMY  AS CHILD  . CARDIOVASCULAR STRESS TEST  03-27-2012  DR CRENSHAW   LOW RISK LEXISCAN STUDY-- PROBABLE NORMAL PERFUSION AND SOFT TISSUE ATTENUATION/  NO  ISCHEMIA/ EF 51%  . CAROTID ENDARTERECTOMY Bilateral LEFT  11-12-2008  DR GREG HAYES   RIGHT ICA  2006  ([facility])  . CYSTOSCOPY WITH LITHOLAPAXY N/A 02/26/2013   Procedure: CYSTOSCOPY WITH LITHOLAPAXY;  Surgeon: Franchot Gallo, MD;  Location: [facility];  Service: Urology;  Laterality: N/A;  . EYE SURGERY  Jan. 2016   cataract surgery both eyes  . INGUINAL HERNIA REPAIR Right 11-08-2006  . MASS EXCISION N/A 03/03/2016   Procedure: EXCISION OF BACK  MASS;  Surgeon: Stark Klein, MD;  Location: [place];  Service: General;  Laterality: N/A;  . 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 PROSTATE SURGERY    . TRANSURETHRAL RESECTION OF PROSTATE N/A 02/26/2013   Procedure: TRANSURETHRAL RESECTION OF THE PROSTATE WITH GYRUS INSTRUMENTS;  Surgeon: Franchot Gallo, MD;  Location: [facility];  Service: Urology;  Laterality: N/A;    There were no vitals filed for this visit.   Subjective Assessment - 08/13/20 1118    Subjective  Pt stated he did not like completing the dowel rod exercises last week because they were difficult. Pt also reporting feeling an uncomfortable stretching in his back when attempting cross-leg LB dressing technique; he preferred technique using reacher.  Pertinent History R MCA infarct (2006)    Patient Stated Goals Increaing functional use of LUE    Currently in Pain? No/denies            OT Treatments/Exercises (OP) - 08/13/20 1210      ADLs   LB Dressing OT problem-solved w/ pt to find preferred alternative method for donning pants; Pt Mod A to don pants using reacher to thread LEs   Max A to doff pants     Cognitive Exercises   Other Cognitive Exercises 1 OT played Blink game requiring pt to match cards by shape, color, or number; pt required min verbal and visual cues to recall all methods of matching   OT instructed pt to use L hand throughout activity         Additional Treatment: - Pt  completed interior of puzzle after outside border was in place; pt was able to place 3/8 pieces w/ Mod I (extra time) and required Min A for remaining 5/8 pieces due to difficulty with spatial relations, orientation, and some figure-ground perception; OT instructed pt to use L hand throughout puzzle activity to encourage [facility] and functional UE use     OT Education - 08/13/20 1118    Education Details Education provided on alternative LB dressing method w/ and w/out use of AE.    Person(s) Educated Patient    Methods Explanation;Demonstration    Comprehension Verbalized understanding;Need further instruction;Returned demonstration            OT Short Term Goals - 08/13/20 1202      OT SHORT TERM GOAL #1   Title Pt will complete UB dressing with Min A at least 50% of the time    Baseline Mod A    Time 3    Period Weeks    Status Achieved   08/11/20 - pt able to consistently doff UB clothing w/ Mod I and don shirts w/ Min A to Mod I, per observation and wife's report   Target Date 08/08/20      OT SHORT TERM GOAL #2   Title Pt will improve GMC for greater participation in LB dressing as evidenced by increasing Box and Blocks Test by at least 10 blocks    Baseline L hand (20 blocks); R hand (29 blocks)    Time 3    Period Weeks    Status Not Met   07/28/20 - L hand (23 blocks); R hand (36 blocks). Pt required v/c to continue task due to decreased working memory.     OT SHORT TERM GOAL #3   Title Pt will complete simple tabletop puzzle/maze with Min A at least 50% of the time to improve motor planning    Time 3    Period Weeks    Status Deferred   08/14/31 - Completed simple puzzle w/ Mod A            OT Long Term Goals - 07/16/20 1411      OT LONG TERM GOAL #1   Title Pt will complete HEP designed for strengthening and functional use of LUE with Mod I and report carryover at home    Time 6    Period Weeks    Status On-going      OT LONG TERM GOAL #2   Title Pt will be able  to complete LB dressing with Min A at least 50% of the time    Baseline Mod A donning pants    Time  6    Period Weeks    Status On-going      OT LONG TERM GOAL #3   Title Pt will improve FMC to increase functional use of UE as evidenced by decreasing 9-HPT by at least 10 seconds with L hand    Time 6    Period Weeks    Status On-going      OT LONG TERM GOAL #4   Title Pt will complete a cognitive-based activity with Min A to improve executive functioning skills and safety during daily activities    Time 6    Period Weeks    Status On-going             Plan - 08/13/20 1140    Clinical Impression Statement Pt demonstrated difficulty w/ problem-solving during LB dressing practice; due to novel method, OT will practice donning pants initially, and then progress to doffing. Pt also exhibited improved ability to recall game rules when playing novel card game this session, requiring Min cues and modeling occasionally during game play.    OT Occupational Profile and History Detailed Assessment- Review of Records and additional review of physical, cognitive, psychosocial history related to current functional performance    Occupational performance deficits (Please refer to evaluation for details): ADL's;IADL's;Leisure    Body Structure / Function / Physical Skills ADL;UE functional use;Body mechanics;Pain;FMC;GMC;Coordination;ROM;IADL;Strength;Dexterity    Cognitive Skills Attention;Problem Solve;Safety Awareness    Psychosocial Skills Environmental  Adaptations    Rehab Potential Good    Clinical Decision Making Multiple treatment options, significant modification of task necessary    Comorbidities Affecting Occupational Performance: May have comorbidities impacting occupational performance    Modification or Assistance to Complete Evaluation  Min-Moderate modification of tasks or assist with assess necessary to complete eval    OT Frequency 2x / week    OT Duration 6 weeks    OT  Treatment/Interventions Self-care/ADL training;Visual/perceptual remediation/compensation;Patient/family education;Passive range of motion;Cryotherapy;Electrical Stimulation;Contrast Bath;Coping strategies training;Moist Heat;Therapeutic exercise;Manual Therapy;Therapeutic activities;Neuromuscular education;Cognitive remediation/compensation    Plan LB dressing -- donning pants; continue poc w/ cognitive activities    Consulted and Agree with Plan of Care Patient;Family member/caregiver    Family Member Consulted Wife [name])           Patient will benefit from skilled therapeutic intervention in order to improve the following deficits and impairments:   Body Structure / Function / Physical Skills: ADL,UE functional use,Body mechanics,Pain,FMC,GMC,Coordination,ROM,IADL,Strength,Dexterity Cognitive Skills: Attention,Problem Solve,Safety Awareness Psychosocial Skills: Environmental  Adaptations   Occupational Therapy Progress Note  Dates of Reporting Period: 07/14/20 - 08/13/20  Objective Reports of Subjective Statement: Pt demonstrates decreased BUE strength as well as decreased generalized endurance, which continue to impact participation in ADLs.  Objective Measurements: Pt is able to complete UB dressing w/ Min A greater than 50% of the time and has improved Box and Blocks Test by 7 blocks w/ his R hand and 3 blocks w/ his L hand. Pt continues to work toward functional problem-solving, [place], LB dressing, and strength and conditioning.  Goal Update: Me 1/3 STGs and progressing toward 4/5 LTG. 1/3 STGs was not met.  Reason Skilled Services are Required: Pt continues to demonstrate difficulty w/ UE strength and coordination, [place], and cognition/executive functioning limiting participation and safety w/ ADLs and IADLs.    Visit Diagnosis: Hemiplegia and hemiparesis following cerebral infarction affecting left non-dominant side (HCC)  Other lack of coordination  Other symptoms and signs  involving cognitive functions following cerebral infarction  Other symptoms and signs involving  the nervous system    Problem List Patient Active Problem List   Diagnosis Date Noted  . Depression with anxiety 01/29/2020  . Leg pain, bilateral 01/29/2020  . Ingrown toenail 07/13/2019  . Lumbar spondylosis 05/02/2018  . Pain in left knee 03/09/2018  . 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 Trochanteric bursitis of left hip 01/16/2018  . Preventative health care 09/26/2017  . HTN (hypertension) 07/19/2015  . Hyperlipidemia 07/19/2015  . Great toe pain 02/11/2014  . 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 Obesity (BMI 30-39.9) 06/25/2013  . Renal insufficiency 06/25/2013  . Weakness of left arm 06/25/2013  . Sebaceous cyst 03/03/2011  . Sprain of lumbar region 07/31/2010  . Rib pain, left 08/29/2009  . Carotid artery stenosis, bilateral 05/02/2009  . Eczema, atopic 05/31/2008  . Vitamin D deficiency 03/01/2008  . BPH (benign prostatic hyperplasia) 08/06/2007  . Fasting hyperglycemia 12/21/2006  . History of right MCA infarct [address], OTR/L, MSOT 08/13/2020, 3:25 PM  [place]. [place], [name], [address] Phone: [phone]   Fax:  [phone]  Name: PHU RECORD MRN: [id number] Date of Birth: 1941-03-19

## 2020-08-14 ENCOUNTER — Ambulatory Visit
Admission: RE | Admit: 2020-08-14 | Discharge: 2020-08-14 | Disposition: A | Discharge status: Home / Self Care | Payer: PPO | Source: Ambulatory Visit | Attending: Neurology | Admitting: Neurology

## 2020-08-14 ENCOUNTER — Encounter: Payer: Self-pay | Admitting: Family Medicine

## 2020-08-14 DIAGNOSIS — J341 Cyst and mucocele of nose and nasal sinus: Secondary | ICD-10-CM | POA: Diagnosis not present

## 2020-08-14 DIAGNOSIS — I6389 Other cerebral infarction: Secondary | ICD-10-CM | POA: Diagnosis not present

## 2020-08-14 DIAGNOSIS — R519 Headache, unspecified: Secondary | ICD-10-CM

## 2020-08-14 DIAGNOSIS — G9389 Other specified disorders of brain: Secondary | ICD-10-CM | POA: Diagnosis not present

## 2020-08-14 DIAGNOSIS — I6782 Cerebral ischemia: Secondary | ICD-10-CM | POA: Diagnosis not present

## 2020-08-14 DIAGNOSIS — Z8673 Personal history of transient ischemic attack (TIA), and cerebral infarction without residual deficits: Secondary | ICD-10-CM

## 2020-08-14 NOTE — Telephone Encounter (Signed)
Please refer to lipid clinic 

## 2020-08-14 NOTE — Telephone Encounter (Signed)
Please advise 

## 2020-08-15 NOTE — Progress Notes (Signed)
Pt advised of his MRI results.

## 2020-08-18 ENCOUNTER — Other Ambulatory Visit: Payer: Self-pay

## 2020-08-18 ENCOUNTER — Ambulatory Visit: Payer: PPO | Admitting: Occupational Therapy

## 2020-08-18 ENCOUNTER — Encounter: Payer: Self-pay | Admitting: Occupational Therapy

## 2020-08-18 DIAGNOSIS — R278 Other lack of coordination: Secondary | ICD-10-CM

## 2020-08-18 DIAGNOSIS — I69354 Hemiplegia and hemiparesis following cerebral infarction affecting left non-dominant side: Secondary | ICD-10-CM | POA: Diagnosis not present

## 2020-08-18 DIAGNOSIS — R29818 Other symptoms and signs involving the nervous system: Secondary | ICD-10-CM

## 2020-08-18 DIAGNOSIS — I69318 Other symptoms and signs involving cognitive functions following cerebral infarction: Secondary | ICD-10-CM

## 2020-08-18 NOTE — Therapy (Signed)
[address]. [place], [name], [address] Phone: [phone]   Fax:  [phone]  Occupational Therapy Treatment  Patient Details  Name: William [name] MRN: [id number] Date of Birth: February 24, 1941 Referring Provider (OT): Roma Schanz, DO (PCP)   Encounter Date: 08/18/2020   OT End of Session - 08/18/20 1323    Visit Number 11    Number of Visits 13    Date for OT Re-Evaluation 08/29/20    Authorization Type [employer]    Progress Note Due on Visit 10    OT Start Time 1315    OT Stop Time 1400    OT Time Calculation (min) 45 min    Activity Tolerance Patient tolerated treatment well    Behavior During Therapy [facility] for tasks assessed/performed           Past Medical History:  Diagnosis Date  . Arthritis    low back  . 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 Bladder stone   . BPH (benign prostatic hyperplasia) 08/06/2007  . Carotid artery occlusion   . 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 Closed fracture of fifth metacarpal bone 05/15/2015  . Eczema   . Fasting hyperglycemia 12/21/2006  . GERD (gastroesophageal reflux disease)   . History of carotid artery stenosis    S/P BILATERAL CEA  . 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 HTN (hypertension) 07/19/2015  . Hyperlipidemia   . Hypertension   . 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 Nocturia   . Renal insufficiency 06/25/2013  . S/P carotid endarterectomy    BILATERAL ICA--  PATENT PER DUPLEX  05-19-2012  . 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 Urinary frequency   . Vitamin D deficiency     Past Surgical History:  Procedure Laterality Date  . APPENDECTOMY  AS CHILD  . CARDIOVASCULAR STRESS TEST  03-27-2012  DR CRENSHAW   LOW RISK LEXISCAN STUDY-- PROBABLE NORMAL PERFUSION AND SOFT TISSUE ATTENUATION/  NO  ISCHEMIA/ EF 51%  . CAROTID ENDARTERECTOMY Bilateral LEFT  11-12-2008  DR GREG HAYES   RIGHT ICA  2006  ([facility])  . CYSTOSCOPY WITH LITHOLAPAXY N/A 02/26/2013   Procedure: CYSTOSCOPY WITH LITHOLAPAXY;  Surgeon: Franchot Gallo, MD;  Location: [facility];  Service: Urology;  Laterality: N/A;  . EYE SURGERY  Jan. 2016   cataract surgery both eyes  . INGUINAL HERNIA REPAIR Right 11-08-2006  . MASS EXCISION N/A 03/03/2016   Procedure: EXCISION OF BACK  MASS;  Surgeon: Stark Klein, MD;  Location: [place];  Service: General;  Laterality: N/A;  . 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 PROSTATE SURGERY    . TRANSURETHRAL RESECTION OF PROSTATE N/A 02/26/2013   Procedure: TRANSURETHRAL RESECTION OF THE PROSTATE WITH GYRUS INSTRUMENTS;  Surgeon: Franchot Gallo, MD;  Location: [facility];  Service: Urology;  Laterality: N/A;    There were no vitals filed for this visit.   Subjective Assessment - 08/18/20 1319    Subjective  Pt reported that he was not able to practice donning/doffing his pants w/ the reacher, but that he was able to complete it independently at home. Pt also mentioned that he stopped taking a medication because it was really hurting his  legs at night.    Pertinent History R MCA infarct (2006)    Patient Stated Goals Increaing functional use of LUE    Currently in Pain? No/denies            OT Treatments/Exercises (OP) - 08/18/20 1549      ADLs   LB Dressing OT and pt discussed pt's preferred LB dressing technique and pt demo'd simulated LB dressing w/ SPV      Cognitive Exercises   Other Cognitive Exercises 1 Played Uno game requiring pt to match cards by color, number, or symbol to work on [place], recall, attention, and coordination; OT reviewed all rules and symbols at start of game and pt was able to recall steps w/ Min cueing   Attention and recall improved w/ decreased distracting stimuli and increased  repetition; OT instructed pt to use L hand to deal and manipulate cards throughout game           OT Short Term Goals - 08/13/20 1202      [place] #1   Title Pt will complete UB dressing with Min A at least 50% of the time    Baseline Mod A    Time 3    Period Weeks    Status Achieved   08/11/20 - pt able to consistently doff UB clothing w/ Mod I and don shirts w/ Min A to Mod I, per observation and wife's report   Target Date 08/08/20      OT SHORT TERM GOAL #2   Title Pt will improve GMC for greater participation in LB dressing as evidenced by increasing Box and Blocks Test by at least 10 blocks    Baseline L hand (20 blocks); R hand (29 blocks)    Time 3    Period Weeks    Status Not Met   07/28/20 - L hand (23 blocks); R hand (36 blocks). Pt required v/c to continue task due to decreased working memory.     OT SHORT TERM GOAL #3   Title Pt will complete simple tabletop puzzle/maze with Min A at least 50% of the time to improve motor planning    Time 3    Period Weeks    Status Deferred   08/14/31 - Completed simple puzzle w/ Mod A           OT Long Term Goals - 08/18/20 1442      OT LONG TERM GOAL #1   Title Pt will complete HEP designed for strengthening and functional use of LUE with Mod I and report carryover at home    Time 6    Period Weeks    Status On-going      OT LONG TERM GOAL #2   Title Pt will be able to complete LB dressing with Min A at least 50% of the time    Baseline Mod A donning pants    Time 6    Period Weeks    Status On-going      OT LONG TERM GOAL #3   Title Pt will improve [place] to increase functional use of UE as evidenced by decreasing 9-HPT by at least 10 seconds with L hand    Time 6    Period Weeks    Status On-going      OT LONG TERM GOAL #4   Title Pt will complete a cognitive-based activity with Min A to improve executive functioning skills and safety during daily activities  Time 6    Period Weeks    Status Achieved    08/18/20 - pt able to complete cognitive-based games/activities w/ Min A consistently           Plan - 08/18/20 1436    Clinical Impression Statement Pt demonstrated significant improvement in following rules/recalling steps of card game played this session; he continues to demo decreased attention when surrounded by distracting stimuli, but was able to be re-directed easily when stimulus was removed. Per wife's report, he is doing better with functional activities and they are both pleased with the progress he has made.    OT Occupational Profile and History Detailed Assessment- Review of Records and additional review of physical, cognitive, psychosocial history related to current functional performance    Occupational performance deficits (Please refer to evaluation for details): ADL's;IADL's;Leisure    Body Structure / Function / Physical Skills ADL;UE functional use;Body mechanics;Pain;FMC;GMC;Coordination;ROM;IADL;Strength;Dexterity    Cognitive Skills Attention;Problem Solve;Safety Awareness    Psychosocial Skills Environmental  Adaptations    Rehab Potential Good    Clinical Decision Making Multiple treatment options, significant modification of task necessary    Comorbidities Affecting Occupational Performance: May have comorbidities impacting occupational performance    Modification or Assistance to Complete Evaluation  Min-Moderate modification of tasks or assist with assess necessary to complete eval    OT Frequency 2x / week    OT Duration 6 weeks    OT Treatment/Interventions Self-care/ADL training;Visual/perceptual remediation/compensation;Patient/family education;Passive range of motion;Cryotherapy;Electrical Stimulation;Contrast Bath;Coping strategies training;Moist Heat;Therapeutic exercise;Manual Therapy;Therapeutic activities;Neuromuscular education;Cognitive remediation/compensation    Plan Prepare for d/c    Consulted and Agree with Plan of Care Patient;Family  member/caregiver    Family Member Consulted Wife [name])           Patient will benefit from skilled therapeutic intervention in order to improve the following deficits and impairments:   Body Structure / Function / Physical Skills: ADL,UE functional use,Body mechanics,Pain,FMC,GMC,Coordination,ROM,IADL,Strength,Dexterity Cognitive Skills: Attention,Problem Solve,Safety Awareness Psychosocial Skills: Environmental  Adaptations   Visit Diagnosis: Hemiplegia and hemiparesis following cerebral infarction affecting left non-dominant side (HCC)  Other lack of coordination  Other symptoms and signs involving cognitive functions following cerebral infarction  Other symptoms and signs involving the nervous system    Problem List Patient Active Problem List   Diagnosis Date Noted  . Depression with anxiety 01/29/2020  . Leg pain, bilateral 01/29/2020  . Ingrown toenail 07/13/2019  . Lumbar spondylosis 05/02/2018  . Pain in left knee 03/09/2018  . 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 Trochanteric bursitis of left hip 01/16/2018  . Preventative health care 09/26/2017  . HTN (hypertension) 07/19/2015  . Hyperlipidemia 07/19/2015  . Great toe pain 02/11/2014  . 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 Obesity (BMI 30-39.9) 06/25/2013  . Renal insufficiency 06/25/2013  . Weakness of left arm 06/25/2013  . Sebaceous cyst 03/03/2011  . Sprain of lumbar region 07/31/2010  . Rib pain, left 08/29/2009  . Carotid artery stenosis, bilateral 05/02/2009  . Eczema, atopic 05/31/2008  . Vitamin D deficiency 03/01/2008  . BPH (benign prostatic hyperplasia) 08/06/2007  . Fasting hyperglycemia 12/21/2006  . History of right MCA infarct [address], OTR/L, MSOT 08/18/2020, 3:57 PM  [place]. [place], [name], [address] Phone: [phone]   Fax:  [phone]  Name: ZIAD MAYE MRN:  [id number] Date of Birth: 1940/10/11

## 2020-08-19 MED FILL — ESZOPICLONE 3 MG TABS: 3 | 90 days supply | Qty: 90 | Fill #1

## 2020-08-19 MED FILL — LORAZEPAM 0.5 MG TABS: 0.5 | 30 days supply | Qty: 150 | Fill #2

## 2020-08-20 ENCOUNTER — Encounter: Payer: Self-pay | Admitting: Occupational Therapy

## 2020-08-20 ENCOUNTER — Ambulatory Visit: Payer: PPO | Admitting: Occupational Therapy

## 2020-08-20 ENCOUNTER — Other Ambulatory Visit: Payer: Self-pay

## 2020-08-20 DIAGNOSIS — R29818 Other symptoms and signs involving the nervous system: Secondary | ICD-10-CM

## 2020-08-20 DIAGNOSIS — R278 Other lack of coordination: Secondary | ICD-10-CM

## 2020-08-20 DIAGNOSIS — I69354 Hemiplegia and hemiparesis following cerebral infarction affecting left non-dominant side: Secondary | ICD-10-CM

## 2020-08-20 DIAGNOSIS — I69318 Other symptoms and signs involving cognitive functions following cerebral infarction: Secondary | ICD-10-CM

## 2020-08-20 NOTE — Therapy (Signed)
[place]. [place], [name], [address] Phone: [phone]   Fax:  [phone]  Occupational Therapy Treatment  Patient Details  Name: William [name] MRN: [id number] Date of Birth: 01-03-1941 Referring Provider (OT): Roma Schanz, DO (PCP)   Encounter Date: 08/20/2020   OT End of Session - 08/20/20 1336    Visit Number 12    Number of Visits 13    Date for OT Re-Evaluation 08/29/20    Authorization Type [employer]    Progress Note Due on Visit 10    OT Start Time 1315    OT Stop Time 1400    OT Time Calculation (min) 45 min    Activity Tolerance Patient tolerated treatment well    Behavior During Therapy [facility] for tasks assessed/performed           Past Medical History:  Diagnosis Date  . Arthritis    low back  . 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 Bladder stone   . BPH (benign prostatic hyperplasia) 08/06/2007  . Carotid artery occlusion   . 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 Closed fracture of fifth metacarpal bone 05/15/2015  . Eczema   . Fasting hyperglycemia 12/21/2006  . GERD (gastroesophageal reflux disease)   . History of carotid artery stenosis    S/P BILATERAL CEA  . 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 HTN (hypertension) 07/19/2015  . Hyperlipidemia   . Hypertension   . 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 Nocturia   . Renal insufficiency 06/25/2013  . S/P carotid endarterectomy    BILATERAL ICA--  PATENT PER DUPLEX  05-19-2012  . 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 Urinary frequency   . Vitamin D deficiency     Past Surgical History:  Procedure Laterality Date  . APPENDECTOMY  AS CHILD  . CARDIOVASCULAR STRESS TEST  03-27-2012  DR CRENSHAW   LOW RISK LEXISCAN STUDY-- PROBABLE NORMAL PERFUSION AND SOFT TISSUE ATTENUATION/  NO  ISCHEMIA/ EF 51%  . CAROTID ENDARTERECTOMY Bilateral LEFT  11-12-2008  DR GREG HAYES   RIGHT ICA  2006  ([facility])  . CYSTOSCOPY WITH LITHOLAPAXY N/A 02/26/2013   Procedure: CYSTOSCOPY WITH LITHOLAPAXY;  Surgeon: Franchot Gallo, MD;  Location: [facility];  Service: Urology;  Laterality: N/A;  . EYE SURGERY  Jan. 2016   cataract surgery both eyes  . INGUINAL HERNIA REPAIR Right 11-08-2006  . MASS EXCISION N/A 03/03/2016   Procedure: EXCISION OF BACK  MASS;  Surgeon: Stark Klein, MD;  Location: [facility];  Service: General;  Laterality: N/A;  . 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 PROSTATE SURGERY    . TRANSURETHRAL RESECTION OF PROSTATE N/A 02/26/2013   Procedure: TRANSURETHRAL RESECTION OF THE PROSTATE WITH GYRUS INSTRUMENTS;  Surgeon: Franchot Gallo, MD;  Location: [facility];  Service: Urology;  Laterality: N/A;    There were no vitals filed for this visit.   Subjective Assessment - 08/20/20 1335    Subjective  Pt stated "I'm done" when discussing whether to continue w/ therapy or d/c    Patient is accompanied by: Family member   [name] (wife)   Pertinent History R MCA infarct (2006)    Patient Stated Goals Increaing  functional use of LUE    Currently in Pain? No/denies            OT Treatments/Exercises (OP) - 08/20/20 1435      ADLs   UB Dressing Mod I donning zip-up jacket    LB Dressing Pt demo'd LB dressing (donning/doffing pants and shoes) w/ Mod I    General Comments OT reviewed all goals w/ pt and his spouse and assessed functional [place] w/ 9-HPT   Pt demo'd similar effectiveness during 9-HPT compared with eval     Cognitive Exercises   Other Cognitive Exercises 1 Uno game played, adding in 3rd player (pt's spouse) to increase difficulty; pt was able to follow flow and rules of game w/ Min A and cueing w/ increased difficulty   OT discussed benefit of allowing pt time to problem-solve and process elements  of the game prior to providing assistance with pt's wife   Other Cognitive Exercises 2 Pt completed simple maze and symbol matching activities to facilitate functional problem solving, deduction, and sequencing; able to complete maze w/ Min A and symbol matching w/ Mod I (extra time)            OT Education - 08/20/20 1414    Education Details Education provided to pt and his wife on progress toward goals as well as ideas for activities they can participate in at home. OT provided examples of cueing and discussed the benefit of allowing pt time to process through problem-solving at home w/ spouse in preparation for d/c.    Person(s) Educated Patient;Spouse    Methods Explanation;Demonstration    Comprehension Verbalized understanding;Returned demonstration            OT Short Term Goals - 08/20/20 1431      OT SHORT TERM GOAL #1   Title Pt will complete UB dressing with Min A at least 50% of the time    Baseline Mod A    Time 3    Period Weeks    Status Achieved   08/11/20 - pt able to consistently doff UB clothing w/ Mod I and don shirts w/ Min A to Mod I, per observation and wife's report   Target Date 08/08/20      OT SHORT TERM GOAL #2   Title Pt will improve GMC for greater participation in LB dressing as evidenced by increasing Box and Blocks Test by at least 10 blocks    Baseline L hand (20 blocks); R hand (29 blocks)    Time 3    Period Weeks    Status Not Met   07/28/20 - L hand (23 blocks); R hand (36 blocks). Pt required v/c to continue task due to decreased working memory.     OT SHORT TERM GOAL #3   Title Pt will complete simple tabletop puzzle/maze with Min A at least 50% of the time to improve motor planning    Time 3    Period Weeks    Status Achieved   08/20/20 - pt able to complete simple maze and matching game w/ Min A and cueing           OT Long Term Goals - 08/20/20 1339      OT LONG TERM GOAL #1   Title Pt will complete HEP designed for strengthening  and functional use of LUE with Mod I and report carryover at home    Time 6    Period Weeks    Status On-going  OT LONG TERM GOAL #2   Title Pt will be able to complete LB dressing with Min A at least 50% of the time    Baseline Mod A donning pants    Time 6    Period Weeks    Status Achieved   08/20/20 - Pt demo'd donning/doffing pants and shoes w/ Mod I     OT LONG TERM GOAL #3   Title Pt will improve [place] to increase functional use of UE as evidenced by decreasing 9-HPT by at least 10 seconds with L hand    Time 6    Period Weeks    Status Not Met   08/20/20 - R hand (25 sec); L hand (60 sec)     OT LONG TERM GOAL #4   Title Pt will complete a cognitive-based activity with Min A to improve executive functioning skills and safety during daily activities    Time 6    Period Weeks    Status Achieved   08/18/20 - pt able to complete cognitive-based games/activities w/ Min A consistently           Plan - 08/20/20 1417    Clinical Impression Statement Pt continues to demonstrate improvements with cognitive-based activities/games, problem solving, and functional dressing; consistently demonstrating UB dressing at Mod I (extra time) as well as demonstrating LB dressing (donning/doffing pants and shoes) this session w/ Mod I (extra time; stable surface for balance support). Pt and spouse were receptive to discussion regarding upcoming d/c.    OT Occupational Profile and History Detailed Assessment- Review of Records and additional review of physical, cognitive, psychosocial history related to current functional performance    Occupational performance deficits (Please refer to evaluation for details): ADL's;IADL's;Leisure    Body Structure / Function / Physical Skills ADL;UE functional use;Body mechanics;Pain;FMC;GMC;Coordination;ROM;IADL;Strength;Dexterity    Cognitive Skills Attention;Problem Solve;Safety Awareness    Psychosocial Skills Environmental  Adaptations    Rehab Potential Good     Clinical Decision Making Multiple treatment options, significant modification of task necessary    Comorbidities Affecting Occupational Performance: May have comorbidities impacting occupational performance    Modification or Assistance to Complete Evaluation  Min-Moderate modification of tasks or assist with assess necessary to complete eval    OT Frequency 2x / week    OT Duration 6 weeks    OT Treatment/Interventions Self-care/ADL training;Visual/perceptual remediation/compensation;Patient/family education;Passive range of motion;Cryotherapy;Electrical Stimulation;Contrast Bath;Coping strategies training;Moist Heat;Therapeutic exercise;Manual Therapy;Therapeutic activities;Neuromuscular education;Cognitive remediation/compensation    Plan Prepare for d/c    Consulted and Agree with Plan of Care Patient;Family member/caregiver    Family Member Consulted Wife [name])           Patient will benefit from skilled therapeutic intervention in order to improve the following deficits and impairments:   Body Structure / Function / Physical Skills: ADL,UE functional use,Body mechanics,Pain,FMC,GMC,Coordination,ROM,IADL,Strength,Dexterity Cognitive Skills: Attention,Problem Solve,Safety Awareness Psychosocial Skills: Environmental  Adaptations   Visit Diagnosis: Hemiplegia and hemiparesis following cerebral infarction affecting left non-dominant side (HCC)  Other lack of coordination  Other symptoms and signs involving cognitive functions following cerebral infarction  Other symptoms and signs involving the nervous system    Problem List Patient Active Problem List   Diagnosis Date Noted  . Depression with anxiety 01/29/2020  . Leg pain, bilateral 01/29/2020  . Ingrown toenail 07/13/2019  . Lumbar spondylosis 05/02/2018  . Pain in left knee 03/09/2018  . 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 Trochanteric bursitis of left hip 01/16/2018  . Preventative health care 09/26/2017  .  HTN (hypertension) 07/19/2015  . Hyperlipidemia 07/19/2015  . Great toe pain 02/11/2014  . 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 Obesity (BMI 30-39.9) 06/25/2013  . Renal insufficiency 06/25/2013  . Weakness of left arm 06/25/2013  . Sebaceous cyst 03/03/2011  . Sprain of lumbar region 07/31/2010  . Rib pain, left 08/29/2009  . Carotid artery stenosis, bilateral 05/02/2009  . Eczema, atopic 05/31/2008  . Vitamin D deficiency 03/01/2008  . BPH (benign prostatic hyperplasia) 08/06/2007  . Fasting hyperglycemia 12/21/2006  . History of right MCA infarct [address] OTR/L, MSOT 08/20/2020, 2:44 PM  [place]. [place], [name], [address] Phone: [phone]   Fax:  [phone]  Name: William [name] MRN: [id number] Date of Birth: [birth year]

## 2020-08-25 ENCOUNTER — Other Ambulatory Visit: Payer: Self-pay | Admitting: Nurse Practitioner

## 2020-08-25 ENCOUNTER — Other Ambulatory Visit: Payer: Self-pay

## 2020-08-25 ENCOUNTER — Ambulatory Visit: Payer: PPO | Admitting: Occupational Therapy

## 2020-08-25 DIAGNOSIS — I69354 Hemiplegia and hemiparesis following cerebral infarction affecting left non-dominant side: Secondary | ICD-10-CM

## 2020-08-25 DIAGNOSIS — R278 Other lack of coordination: Secondary | ICD-10-CM

## 2020-08-25 DIAGNOSIS — R29818 Other symptoms and signs involving the nervous system: Secondary | ICD-10-CM

## 2020-08-25 DIAGNOSIS — I69318 Other symptoms and signs involving cognitive functions following cerebral infarction: Secondary | ICD-10-CM

## 2020-08-25 NOTE — Therapy (Signed)
[place]. [place], [name], [address] Phone: [phone]   Fax:  [phone]  Occupational Therapy Treatment & Discharge Summary  Patient Details  Name: William [name] MRN: [id number] Date of Birth: [birth year] Referring Provider (OT): Roma Schanz, DO (PCP)   Encounter Date: 08/25/2020   OT End of Session - 08/25/20 1559    Visit Number 13    Number of Visits 13    Date for OT Re-Evaluation 08/29/20    Authorization Type [employer]    Progress Note Due on Visit 10    OT Start Time 1315    OT Stop Time 1406    OT Time Calculation (min) 51 min    Activity Tolerance Patient tolerated treatment well    Behavior During Therapy [facility] for tasks assessed/performed           Past Medical History:  Diagnosis Date  . Arthritis    low back  . 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 Bladder stone   . BPH (benign prostatic hyperplasia) 08/06/2007  . Carotid artery occlusion   . 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 Closed fracture of fifth metacarpal bone 05/15/2015  . Eczema   . Fasting hyperglycemia 12/21/2006  . GERD (gastroesophageal reflux disease)   . History of carotid artery stenosis    S/P BILATERAL CEA  . 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 HTN (hypertension) 07/19/2015  . Hyperlipidemia   . Hypertension   . 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 Nocturia   . Renal insufficiency 06/25/2013  . S/P carotid endarterectomy    BILATERAL ICA--  PATENT PER DUPLEX  05-19-2012  . 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 Urinary frequency   . Vitamin D deficiency     Past Surgical History:  Procedure Laterality Date  . APPENDECTOMY  AS CHILD  . CARDIOVASCULAR STRESS TEST  03-27-2012  DR CRENSHAW   LOW RISK LEXISCAN STUDY-- PROBABLE NORMAL PERFUSION AND SOFT  TISSUE ATTENUATION/  NO ISCHEMIA/ EF 51%  . CAROTID ENDARTERECTOMY Bilateral LEFT  11-12-2008  DR GREG HAYES   RIGHT ICA  2006  ([facility])  . CYSTOSCOPY WITH LITHOLAPAXY N/A 02/26/2013   Procedure: CYSTOSCOPY WITH LITHOLAPAXY;  Surgeon: Franchot Gallo, MD;  Location: [facility];  Service: Urology;  Laterality: N/A;  . EYE SURGERY  Jan. 2016   cataract surgery both eyes  . INGUINAL HERNIA REPAIR Right 11-08-2006  . MASS EXCISION N/A 03/03/2016   Procedure: EXCISION OF BACK  MASS;  Surgeon: Stark Klein, MD;  Location: [facility];  Service: General;  Laterality: N/A;  . 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 PROSTATE SURGERY    . TRANSURETHRAL RESECTION OF PROSTATE N/A 02/26/2013   Procedure: TRANSURETHRAL RESECTION OF THE PROSTATE WITH GYRUS INSTRUMENTS;  Surgeon: Franchot Gallo, MD;  Location: [facility];  Service: Urology;  Laterality: N/A;    There were no vitals filed for this visit.   Subjective Assessment - 08/25/20 1541    Subjective  Pt reports he feels like he has improved since coming to therapy and asked if he was able to call back if he felt like he needed to.    Patient is accompanied by: Family member  [name] (wife)   Pertinent History R MCA infarct (2006)    Patient Stated Goals Increaing functional use of LUE    Currently in Pain? No/denies            OT Treatments/Exercises (OP) - 08/25/20 1609      Cognitive Exercises   Other Cognitive Exercises 1 OT played Go Fish with pt using standard playing cards to facilitate attention to task, recall, working memory, and deduction   Pt able to remember strategies incorporated during game from previous sessions and occasionally required verbal cue/binary choice to recall how to continue his turn when he would succeed in receiving cards asked for     Shoulder Exercises: ROM/Strengthening   Other ROM/Strengthening Exercises OT reviewed current BUE strengthening  exercises pt completes in workout routine and demonstrated additional exercises pt can incorporate into his routine or complete at home   handout provided     Hand Exercises   Other Hand Exercises Resistance clothespins used for L hand strengthing; pt instructed to pinch clothespins onto edge of small bowl    Other Hand Exercises OT demonstrated various coordination activities pt is able to complete at home using LUE; pt returned demonstration of each   handout provided           OT Education - 08/25/20 1559    Education Details Education provided on BUE exercises and coordination activities pt can continue to work on at home.    Person(s) Educated Patient;Spouse    Methods Explanation;Demonstration;Handout    Comprehension Verbalized understanding;Returned demonstration            OT Short Term Goals - 08/25/20 1332      OT SHORT TERM GOAL #1   Title Pt will complete UB dressing with Min A at least 50% of the time    Baseline Mod A    Time 3    Period Weeks    Status Achieved   08/11/20 - pt able to consistently doff UB clothing w/ Mod I and don shirts w/ Min A to Mod I, per observation and wife's report   Target Date 08/08/20      OT SHORT TERM GOAL #2   Title Pt will improve GMC for greater participation in LB dressing as evidenced by increasing Box and Blocks Test by at least 10 blocks    Baseline L hand (20 blocks); R hand (29 blocks)    Time 3    Period Weeks    Status Not Met   08/25/20 - L hand (20 blocks); R hand (36 blocks); pt demo'd improved working memory     OT [place] #3   Title Pt will complete simple tabletop puzzle/maze with Min A at least 50% of the time to improve motor planning    Time 3    Period Weeks    Status Achieved   08/20/20 - pt able to complete simple maze and matching game w/ Min A and cueing           OT Long Term Goals - 08/25/20 1334      OT LONG TERM GOAL #1   Title Pt will complete HEP designed for strengthening and functional  use of LUE with Mod I and report carryover at home    Time 6    Period Weeks    Status Partially Met   08/25/20 - pt consistently involved BUE into biweekly workout routine; OT also provided additional BUE strengthening exercises for pt to incorporate  prn     OT LONG TERM GOAL #2   Title Pt will be able to complete LB dressing with Min A at least 50% of the time    Baseline Mod A donning pants    Time 6    Period Weeks    Status Achieved   08/20/20 - Pt demo'd donning/doffing pants and shoes w/ Mod I     OT LONG TERM GOAL #3   Title Pt will improve FMC to increase functional use of UE as evidenced by decreasing 9-HPT by at least 10 seconds with L hand    Time 6    Period Weeks    Status Not Met   08/20/20 - R hand (25 sec); L hand (60 sec)     OT LONG TERM GOAL #4   Title Pt will complete a cognitive-based activity with Min A to improve executive functioning skills and safety during daily activities    Time 6    Period Weeks    Status Achieved   08/18/20 - pt able to complete cognitive-based games/activities w/ Min A consistently           Plan - 08/25/20 1600    Clinical Impression Statement Pt is a 80 y.o. male seen in OP OT due to LUE weakness and limitations with functional activities 2/2 R MCA infarct sustained in 2006. Tx has incorporated LUE functional use, [place], cognition, and adaptive strategies to improve safety w/ ADLs. Pt has shown improvements in attention, executive functioning, memory and recall, and dressing tasks, and is appriopriate for d/c at this time. Pt and his spouse encouraged to call back if they notice any functional changes or development of new/worsening limitations.    OT Occupational Profile and History Detailed Assessment- Review of Records and additional review of physical, cognitive, psychosocial history related to current functional performance    Occupational performance deficits (Please refer to evaluation for details): ADL's;IADL's;Leisure    Body  Structure / Function / Physical Skills ADL;UE functional use;Body mechanics;Pain;FMC;GMC;Coordination;ROM;IADL;Strength;Dexterity    Cognitive Skills Attention;Problem Solve;Safety Awareness    Psychosocial Skills Environmental  Adaptations    Rehab Potential Good    Clinical Decision Making Multiple treatment options, significant modification of task necessary    Comorbidities Affecting Occupational Performance: May have comorbidities impacting occupational performance    Modification or Assistance to Complete Evaluation  Min-Moderate modification of tasks or assist with assess necessary to complete eval    OT Frequency 2x / week    OT Duration 6 weeks    OT Treatment/Interventions Self-care/ADL training;Visual/perceptual remediation/compensation;Patient/family education;Passive range of motion;Cryotherapy;Electrical Stimulation;Contrast Bath;Coping strategies training;Moist Heat;Therapeutic exercise;Manual Therapy;Therapeutic activities;Neuromuscular education;Cognitive remediation/compensation    Plan --    Consulted and Agree with Plan of Care Patient;Family member/caregiver    Family Member Consulted Wife [name])           Patient will benefit from skilled therapeutic intervention in order to improve the following deficits and impairments:   Body Structure / Function / Physical Skills: ADL,UE functional use,Body mechanics,Pain,FMC,GMC,Coordination,ROM,IADL,Strength,Dexterity Cognitive Skills: Attention,Problem Solve,Safety Awareness Psychosocial Skills: Environmental  Adaptations    OCCUPATIONAL THERAPY DISCHARGE SUMMARY  Visits from Start of Care: 12  Current functional level related to goals / functional outcomes: Pt is able to complete UB and LB dressing consistently w/ Mod I and complete tabletop activities/games w/ Min A   Remaining deficits: Executive function, memory, attention, decreased FMC/GMC of LUE   Education / Equipment: HEP, compensatory/adaptive strategies  during ADLs and functional cognitive activities, spouse/caregiver  education   Plan: Patient agrees to discharge.  Patient goals were partially met. Patient is being discharged due to being pleased with the current functional level.  Pt and spouse are satisfied w/ current functional level and facilitated conversation about d/c from therapy.?????      Visit Diagnosis: Hemiplegia and hemiparesis following cerebral infarction affecting left non-dominant side (HCC)  Other lack of coordination  Other symptoms and signs involving cognitive functions following cerebral infarction  Other symptoms and signs involving the nervous system    Problem List Patient Active Problem List   Diagnosis Date Noted  . Depression with anxiety 01/29/2020  . Leg pain, bilateral 01/29/2020  . Ingrown toenail 07/13/2019  . Lumbar spondylosis 05/02/2018  . Pain in left knee 03/09/2018  . 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 Trochanteric bursitis of left hip 01/16/2018  . Preventative health care 09/26/2017  . HTN (hypertension) 07/19/2015  . Hyperlipidemia 07/19/2015  . Great toe pain 02/11/2014  . 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 Obesity (BMI 30-39.9) 06/25/2013  . Renal insufficiency 06/25/2013  . Weakness of left arm 06/25/2013  . Sebaceous cyst 03/03/2011  . Sprain of lumbar region 07/31/2010  . Rib pain, left 08/29/2009  . Carotid artery stenosis, bilateral 05/02/2009  . Eczema, atopic 05/31/2008  . Vitamin D deficiency 03/01/2008  . BPH (benign prostatic hyperplasia) 08/06/2007  . Fasting hyperglycemia 12/21/2006  . History of right MCA infarct [address], OTR/L, MSOT 08/25/2020, 4:21 PM  [place]. [place], [name], [address] Phone: [phone]   Fax:  [phone]  Name: William [name] MRN: [id number] Date of Birth: 1941-02-28

## 2020-08-25 NOTE — Patient Instructions (Addendum)
Access Code: [email or web address] URL: [email or web address] Date: 08/25/2020 Prepared by: Merleen Nicely, OTR/L  *You can add these to your regular workouts with [employer]e Belts, or you can complete these at home with weights at or less than 5 lbs.  Arm Exercises: - Seated Chest Press with Dumbbells - 2 sets - 15 reps - Standing Chest Press with Dumbbells - 2 sets - 15 reps - Seated Overhead Press with Dumbbells - 2 sets - 15 reps - Shoulder Overhead Press in Flexion with Dumbbells - 2 sets - 15 reps   Coordination Exercises  Perform the following exercises for 10-20 minutes 1 times per day. Perform with L hand or with both hand(s)   Flipping Cards  Deal cards: Hold 1/2 or whole deck in your hand. Use thumb to push card off top of deck  Rotate ball with fingertips: Pick up with fingers/thumb and move as much as you can with each turn/movement (clockwise and counter-clockwise).  Toss ball from one hand to the other  Toss ball in the air and catch with the same hand  Pick up coins and place in coin bank or container  Pick up coins and stack one at a time  Pick up 5-10 coins one at a time and hold in palm. Then, move coins from palm to fingertips one at time  Practice writing: Slow down, write big, and focus on forming each letter  Fasten nuts/bolts or put on bottle caps

## 2020-08-27 ENCOUNTER — Ambulatory Visit: Payer: PPO | Admitting: Occupational Therapy

## 2020-09-01 ENCOUNTER — Ambulatory Visit: Payer: PPO | Admitting: Occupational Therapy

## 2020-09-02 MED FILL — LISINOPRIL-HCTZ 10-12.5 MG: 10-12.5 | 30 days supply | Qty: 30 | Fill #5

## 2020-09-02 MED FILL — LIVALO 1 MG TABLET: 1 | 30 days supply | Qty: 30 | Fill #1

## 2020-09-03 ENCOUNTER — Ambulatory Visit: Payer: PPO | Admitting: Occupational Therapy

## 2020-09-08 ENCOUNTER — Ambulatory Visit: Payer: PPO | Admitting: Podiatry

## 2020-09-08 ENCOUNTER — Other Ambulatory Visit: Payer: Self-pay

## 2020-09-08 ENCOUNTER — Ambulatory Visit: Payer: PPO | Admitting: Occupational Therapy

## 2020-09-08 DIAGNOSIS — B351 Tinea unguium: Secondary | ICD-10-CM

## 2020-09-08 DIAGNOSIS — M79676 Pain in unspecified toe(s): Secondary | ICD-10-CM

## 2020-09-09 NOTE — Progress Notes (Signed)
Subjective: 80 y.o. returns the office today for painful, elongated, thickened toenails which he cannot trim himself.  Denies any open lesions.  He has no other concerns today. Denies any systemic complaints such as fevers, chills, nausea, vomiting.   PCP: Carollee Herter, Alferd Apa, DO  Objective: NAD, presents with his wife DP/PT pulses palpable, CRT less than 3 seconds  Nails hypertrophic, dystrophic, elongated, brittle, discolored x 10. There is tenderness overlying the nails 1-5 bilaterally. There is no edema, erythema or drainage to the toenail sites today. Hammertoes present No open lesions or pre-ulcerative lesions are identified. No pain with calf compression, swelling, warmth, erythema.  Assessment: Patient presents with symptomatic onychomycosis  Plan: -Treatment options including alternatives, risks, complications were discussed -Nails debrided x 10 without any complications or bleeding  Return in 9 weeks or sooner if needed  Celesta Gentile, DPM

## 2020-09-10 ENCOUNTER — Ambulatory Visit: Payer: PPO | Admitting: Occupational Therapy

## 2020-09-13 ENCOUNTER — Other Ambulatory Visit (HOSPITAL_BASED_OUTPATIENT_CLINIC_OR_DEPARTMENT_OTHER): Payer: Self-pay

## 2020-09-15 ENCOUNTER — Other Ambulatory Visit (HOSPITAL_BASED_OUTPATIENT_CLINIC_OR_DEPARTMENT_OTHER): Payer: Self-pay

## 2020-09-15 DIAGNOSIS — M546 Pain in thoracic spine: Secondary | ICD-10-CM | POA: Diagnosis not present

## 2020-09-15 MED ORDER — METHYLPREDNISOLONE 4 MG PO TBPK
ORAL_TABLET | ORAL | 0 refills | Status: DC
  Filled 2020-09-15: qty 21, 6d supply, fill #0

## 2020-09-15 MED FILL — Fenofibrate Tab 160 MG: ORAL | 90 days supply | Qty: 90 | Fill #0 | Status: AC

## 2020-09-16 ENCOUNTER — Other Ambulatory Visit (HOSPITAL_BASED_OUTPATIENT_CLINIC_OR_DEPARTMENT_OTHER): Payer: Self-pay

## 2020-09-16 MED ORDER — TRAMADOL HCL 50 MG PO TABS
ORAL_TABLET | ORAL | 0 refills | Status: DC
  Filled 2020-09-16: qty 10, 5d supply, fill #0

## 2020-09-17 ENCOUNTER — Other Ambulatory Visit: Payer: Self-pay

## 2020-09-17 ENCOUNTER — Ambulatory Visit (HOSPITAL_COMMUNITY): Payer: PPO | Admitting: Psychiatry

## 2020-09-17 ENCOUNTER — Encounter (HOSPITAL_COMMUNITY): Payer: Self-pay | Admitting: Psychiatry

## 2020-09-17 ENCOUNTER — Other Ambulatory Visit (HOSPITAL_BASED_OUTPATIENT_CLINIC_OR_DEPARTMENT_OTHER): Payer: Self-pay

## 2020-09-17 DIAGNOSIS — F0630 Mood disorder due to known physiological condition, unspecified: Secondary | ICD-10-CM

## 2020-09-17 DIAGNOSIS — F4323 Adjustment disorder with mixed anxiety and depressed mood: Secondary | ICD-10-CM | POA: Diagnosis not present

## 2020-09-17 MED ORDER — CARBAMAZEPINE ER 100 MG PO TB12
ORAL_TABLET | ORAL | 1 refills | Status: DC
  Filled 2020-09-17 – 2020-11-12 (×2): qty 270, 90d supply, fill #0

## 2020-09-17 MED ORDER — ESZOPICLONE 3 MG PO TABS
ORAL_TABLET | ORAL | 2 refills | Status: DC
  Filled 2020-09-17 – 2020-11-20 (×2): qty 90, 90d supply, fill #0

## 2020-09-17 MED ORDER — LORAZEPAM 0.5 MG PO TABS
ORAL_TABLET | ORAL | 5 refills | Status: DC
  Filled 2020-09-17: qty 150, 30d supply, fill #0
  Filled 2020-11-05: qty 150, 30d supply, fill #1
  Filled 2020-12-10: qty 150, 30d supply, fill #2
  Filled 2021-01-21: qty 150, 30d supply, fill #3

## 2020-09-17 NOTE — Progress Notes (Signed)
Patient ID: William [name], male   DOB: 11-25-1940, 80 y.o.   MRN: [id number] [facility] MD Progress Note  09/17/2020 3:52 PM William [name]  MRN:  [id number] Subjective:  Feeling good Principal Problem: Mild neurocognitive disorder  Today the patient is seen with his wife [name].  Overall he is at his baseline.  He is having low back problem gets physical therapy and occupational therapy and it seems to help.  Overall his mood is stable.  His wife is actually working 3 hours a day and the patient is handling it well.  He is able to be by himself.  Patient's mood is even and stable.  He is not euphoric angry or irritable.  He takes his Tegretol as prescribed.  He is sleeping and eating well.  He is got reasonably good energy.  He does read more and more.  He is reading the paper.  He enjoys his dog a lot.  Overall his functioning actually quite good.  He sleeps very well and contributes to the [place].  Patient has never shown symptoms of psychosis.  His self-esteem is good.  He is very pleasant and easygoing.  He is not oversedated.  He has had no falls.  He denies any chest pain or shortness of breath.  He has had no focal neurological symptoms.  He takes an aspirin a day and recently has blood pressure medicines adjusted.  Past Medical History:  Past Medical History:  Diagnosis Date  . Arthritis    low back  . 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 Bladder stone   . BPH (benign prostatic hyperplasia) 08/06/2007  . Carotid artery occlusion   . 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 Closed fracture of fifth metacarpal bone 05/15/2015  . Eczema   . Fasting hyperglycemia 12/21/2006  . GERD (gastroesophageal reflux disease)   . History of carotid artery stenosis    S/P BILATERAL CEA  . 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 HTN (hypertension) 07/19/2015  . Hyperlipidemia   . Hypertension   . 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   Mild, related to stroke history   . Nocturia   . Renal insufficiency 06/25/2013  . S/P carotid endarterectomy    BILATERAL ICA--  PATENT PER DUPLEX  05-19-2012  . 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 Urinary frequency   . Vitamin D deficiency     Past Surgical History:  Procedure Laterality Date  . APPENDECTOMY  AS CHILD  . CARDIOVASCULAR STRESS TEST  03-27-2012  DR CRENSHAW   LOW RISK LEXISCAN STUDY-- PROBABLE NORMAL PERFUSION AND SOFT TISSUE ATTENUATION/  NO ISCHEMIA/ EF 51%  . CAROTID ENDARTERECTOMY Bilateral LEFT  11-12-2008  DR GREG HAYES   RIGHT ICA  2006  ([facility])  . CYSTOSCOPY WITH LITHOLAPAXY N/A 02/26/2013   Procedure: CYSTOSCOPY WITH LITHOLAPAXY;  Surgeon: Franchot Gallo, MD;  Location: [facility];  Service: Urology;  Laterality: N/A;  . EYE SURGERY  Jan. 2016   cataract surgery both eyes  . INGUINAL HERNIA REPAIR Right 11-08-2006  . MASS EXCISION N/A 03/03/2016   Procedure: EXCISION OF BACK  MASS;  Surgeon: Stark Klein, MD;  Location: [facility];  Service: General;  Laterality: N/A;  . 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 PROSTATE SURGERY    . TRANSURETHRAL RESECTION  OF PROSTATE N/A 02/26/2013   Procedure: TRANSURETHRAL RESECTION OF THE PROSTATE WITH GYRUS INSTRUMENTS;  Surgeon: Franchot Gallo, MD;  Location: [facility];  Service: Urology;  Laterality: N/A;   Family History:  Family History  Problem Relation Age of Onset  . Heart disease Mother        CHF  . Bipolar disorder Mother   . Heart disease Father        CHF   Family Psychiatric  History:  Social History:  Social History   Substance and Sexual Activity  Alcohol Use Yes  . Alcohol/week: 0.0 standard drinks   Comment: Occasional     Social History   Substance and Sexual Activity  Drug Use No    Social History   Socioeconomic History  . Marital status: Married    Spouse name: Not on file  . Number of  children: 2  . Years of education: 31  . Highest education level: High school graduate  Occupational History    Employer: Retired  Tobacco Use  . Smoking status: Former Smoker    Packs/day: 2.00    Years: 40.00    Pack years: 80.00    Types: Cigarettes    Quit date: 02/15/2005    Years since quitting: 15.5  . Smokeless tobacco: Never Used  Vaping Use  . Vaping Use: Never used  Substance and Sexual Activity  . Alcohol use: Yes    Alcohol/week: 0.0 standard drinks    Comment: Occasional  . Drug use: No  . Sexual activity: Yes    Partners: Female  Other Topics Concern  . Not on file  Social History Narrative   Exercise--  Walks dogs everyday      LIves with wife , no stairs in home, caffeine - one cup coffee day, exercise - not much, Right handed, 12th grade, retired      One story home   Social Determinants of Health   Financial Resource Strain: Not on file  Food Insecurity: Not on file  Transportation Needs: Not on file  Physical Activity: Not on file  Stress: Not on file  Social Connections: Not on file   Additional Social History:                         Sleep: Good  Appetite:  Good  Current Medications: Current Outpatient Medications  Medication Sig Dispense Refill  . acetaminophen (TYLENOL) 500 MG tablet Take 500 mg by mouth every 6 (six) hours as needed (1 tab as needed).    [name] amLODipine (NORVASC) 10 MG tablet TAKE 1 TABLET BY MOUTH DAILY 90 tablet 3  . amLODipine (NORVASC) 5 MG tablet TAKE 1 TABLET (5 MG TOTAL) BY MOUTH DAILY. (Patient taking differently: Take 10 mg by mouth daily.) 90 tablet 1  . aspirin 325 MG EC tablet Take 325 mg by mouth daily.    . carbamazepine (TEGRETOL XR) 100 MG 12 hr tablet TAKE 1 TABLET BY MOUTH EVERY MORNING AND 2 AT BEDTIME 270 tablet 1  . Cholecalciferol (VITAMIN D) 2000 UNITS CAPS Take by mouth.    . docusate sodium (COLACE) 100 MG capsule Take 100 mg by mouth daily.    . Eszopiclone 3 MG TABS TAKE 1 TABLET BY  MOUTH IMMEDIATELY BEFORE BEDTIME AS NEEDED 90 tablet 2  . famotidine (PEPCID) 20 MG tablet Take 20 mg by mouth 2 (two) times daily.    . fenofibrate 160 MG tablet TAKE 1 TABLET BY  MOUTH ONCE DAILY 90 tablet 1  . Flaxseed, Linseed, (FLAX SEED OIL PO) Take by mouth daily.    . fluticasone (CUTIVATE) 0.05 % cream   3  . influenza vaccine adjuvanted (FLUAD) 0.5 ML injection TAKE AS DIRECTED .5 mL 0  . lisinopril-hydrochlorothiazide (ZESTORETIC) 10-12.5 MG tablet Take 1 tablet by mouth daily.    [name] lisinopril-hydrochlorothiazide (ZESTORETIC) 10-12.5 MG tablet TAKE 1 TABLET BY MOUTH DAILY 30 tablet 5  . LORazepam (ATIVAN) 0.5 MG tablet TAKE 2 TABLETS BY MOUTH TWICE A DAY MAY TAKE 1 ADDITIONAL AS NEEDED 150 tablet 5  . methylPREDNISolone (MEDROL DOSEPAK) 4 MG TBPK tablet Take 6 tablets by mouth on day 1, then 5 tablets on day 2, then 4 tablets on day 3, then 3 tablets on day 4, then 2 tablets on day 5, then 1 tablet on day 6 then stop. 21 each 0  . Multiple Vitamin (MULTIVITAMIN) tablet Take 1 tablet by mouth daily.    . NONFORMULARY OR COMPOUNDED ITEM [facility]:  Antifungal topical - Terbinafine 3%, Fluconazole 2%, Tea Tree Oil 5%, Urea 10/%,, Ibuprofen 2% in DMSO Suspension #81ml. Apply to the affected nail(s) once (at bedtime) or twice daily. 100 each 2  . ondansetron (ZOFRAN ODT) 4 MG disintegrating tablet Take 1 tablet (4 mg total) by mouth every 8 (eight) hours as needed for nausea or vomiting. 20 tablet 0  . pantoprazole (PROTONIX) 40 MG tablet TAKE 1 TABLET (40 MG TOTAL) BY MOUTH 2 (TWO) TIMES DAILY BEFORE A MEAL. 60 tablet 2  . Pitavastatin Calcium 1 MG TABS TAKE ONE TABLET BY MOUTH ONCE DAILY (Patient not taking: Reported on 08/18/2020) 30 tablet 2  . tamsulosin (FLOMAX) 0.4 MG CAPS capsule TAKE 1 CAPSULE BY MOUTH ONCE DAILY 90 capsule 3  . traMADol (ULTRAM) 50 MG tablet Take 1 tablet (50 mg total) by mouth every 8 (eight) hours as needed. 30 tablet 0  . traMADol (ULTRAM) 50 MG tablet Take  1 tablet BY MOUTH twice a day as needed. 30 tablet 0   No current facility-administered medications for this visit.    Lab Results:  No results found for this or any previous visit (from the past 48 hour(s)).  Physical Findings: AIMS:  , ,  ,  ,    CIWA:    COWS:     Musculoskeletal: Strength & Muscle Tone: within normal limits Gait & Station: normal Patient leans: Right  Psychiatric Specialty Exam: ROS  There were no vitals taken for this visit.There is no height or weight on file to calculate BMI.  General Appearance: Casual  Eye Contact::  Good  Speech:  Clear and Coherent  Volume:  Normal  Mood:  Dysphoric and Euthymic  Affect:  Appropriate  Thought Process:  Coherent  Orientation:  Full (Time, Place, and Person)  Thought Content:  WDL  Suicidal Thoughts:  No  Homicidal Thoughts:  No  Memory:  NA  Judgement:  Good  Insight:  Fair  Psychomotor Activity:  Normal  Concentration:  Good  Recall:  Good  Fund of Knowledge:Good  Language: Good  Akathisia:  No  Handed:  Right  AIMS (if indicated):     Assets:  Desire for Improvement  ADL's:  Intact  Cognition: WNL  Sleep:      Treatment Plan Summary: 09/17/2020, 3:52 PM   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  He is treated for vascular dementia with behavioral disturbances and takes Tegretol 200 mg 1 morning and 2  at night.  At his next visit we will clarify his blood work including a Tegretol level.  For this condition he also takes Ativan 0.5 mg 2 twice daily with 1 as needed.  He is tolerating this medication very well.  His wife said that he is doing great and wishes not to change any of the medicines.  I have to agree with her.  He is doing great.  His second problem is that of insomnia.  He takes 3 mg of Lunesta and does well.  He will return to see me in 4-1/2 months.

## 2020-09-22 MED FILL — Amlodipine Besylate Tab 10 MG (Base Equivalent): ORAL | 90 days supply | Qty: 90 | Fill #0 | Status: AC

## 2020-09-23 ENCOUNTER — Encounter: Payer: Self-pay | Admitting: Family Medicine

## 2020-09-23 ENCOUNTER — Ambulatory Visit: Payer: PPO | Attending: Internal Medicine

## 2020-09-23 ENCOUNTER — Other Ambulatory Visit (HOSPITAL_BASED_OUTPATIENT_CLINIC_OR_DEPARTMENT_OTHER): Payer: Self-pay

## 2020-09-23 DIAGNOSIS — Z23 Encounter for immunization: Secondary | ICD-10-CM

## 2020-09-23 NOTE — Progress Notes (Signed)
   Covid-19 Vaccination Clinic  Name:  William [name]    MRN: [id number] DOB: March 10, 1941  09/23/2020  [name] was observed post Covid-19 immunization for 15 minutes without incident. He was provided with Vaccine Information Sheet and instruction to access the V-Safe system.   [name] was instructed to call 911 with any severe reactions post vaccine: [name] Difficulty breathing  . Swelling of face and throat  . A fast heartbeat  . A bad rash all over body  . Dizziness and weakness   Immunizations Administered    Name Date Dose VIS Date Route   PFIZER Comrnaty(Gray TOP) Covid-19 Vaccine 09/23/2020  1:46 PM 0.3 mL 05/22/2020 Intramuscular   Manufacturer: [place]   Lot: [id number]   NDC: [id number]

## 2020-09-29 ENCOUNTER — Other Ambulatory Visit (HOSPITAL_BASED_OUTPATIENT_CLINIC_OR_DEPARTMENT_OTHER): Payer: Self-pay

## 2020-09-29 MED ORDER — PFIZER-BIONT COVID-19 VAC-TRIS 30 MCG/0.3ML IM SUSP
INTRAMUSCULAR | 1 refills | Status: DC
  Filled 2020-09-29: qty 0.3, 1d supply, fill #0

## 2020-09-30 ENCOUNTER — Other Ambulatory Visit (HOSPITAL_BASED_OUTPATIENT_CLINIC_OR_DEPARTMENT_OTHER): Payer: Self-pay

## 2020-10-07 ENCOUNTER — Other Ambulatory Visit (HOSPITAL_BASED_OUTPATIENT_CLINIC_OR_DEPARTMENT_OTHER): Payer: Self-pay

## 2020-10-07 ENCOUNTER — Other Ambulatory Visit: Payer: Self-pay | Admitting: Nephrology

## 2020-10-08 ENCOUNTER — Other Ambulatory Visit (HOSPITAL_BASED_OUTPATIENT_CLINIC_OR_DEPARTMENT_OTHER): Payer: Self-pay

## 2020-10-08 DIAGNOSIS — Z85828 Personal history of other malignant neoplasm of skin: Secondary | ICD-10-CM | POA: Diagnosis not present

## 2020-10-08 DIAGNOSIS — Z08 Encounter for follow-up examination after completed treatment for malignant neoplasm: Secondary | ICD-10-CM | POA: Diagnosis not present

## 2020-10-08 DIAGNOSIS — L821 Other seborrheic keratosis: Secondary | ICD-10-CM | POA: Diagnosis not present

## 2020-10-08 MED ORDER — AMLODIPINE BESYLATE 10 MG PO TABS
1.0000 | ORAL_TABLET | Freq: Every day | ORAL | 3 refills | Status: DC
  Filled 2020-10-08 – 2021-06-15 (×2): qty 90, 90d supply, fill #0

## 2020-10-08 MED ORDER — LISINOPRIL-HYDROCHLOROTHIAZIDE 10-12.5 MG PO TABS
1.0000 | ORAL_TABLET | Freq: Every day | ORAL | 5 refills | Status: DC
  Filled 2020-10-08: qty 30, 30d supply, fill #0
  Filled 2020-11-05: qty 30, 30d supply, fill #1
  Filled 2020-12-03: qty 30, 30d supply, fill #2
  Filled 2020-12-31: qty 30, 30d supply, fill #3
  Filled 2021-01-28: qty 30, 30d supply, fill #4
  Filled 2021-02-25: qty 30, 30d supply, fill #5

## 2020-10-21 ENCOUNTER — Other Ambulatory Visit (HOSPITAL_BASED_OUTPATIENT_CLINIC_OR_DEPARTMENT_OTHER): Payer: Self-pay

## 2020-10-21 MED FILL — Tamsulosin HCl Cap 0.4 MG: ORAL | 90 days supply | Qty: 90 | Fill #0 | Status: AC

## 2020-10-22 DIAGNOSIS — N5201 Erectile dysfunction due to arterial insufficiency: Secondary | ICD-10-CM | POA: Diagnosis not present

## 2020-11-06 ENCOUNTER — Other Ambulatory Visit (HOSPITAL_BASED_OUTPATIENT_CLINIC_OR_DEPARTMENT_OTHER): Payer: Self-pay

## 2020-11-06 MED ORDER — SILDENAFIL CITRATE 100 MG PO TABS
100.0000 mg | ORAL_TABLET | ORAL | 11 refills | Status: DC | PRN
  Filled 2020-11-06: qty 10, 30d supply, fill #0

## 2020-11-12 ENCOUNTER — Other Ambulatory Visit (HOSPITAL_BASED_OUTPATIENT_CLINIC_OR_DEPARTMENT_OTHER): Payer: Self-pay

## 2020-11-13 ENCOUNTER — Emergency Department (HOSPITAL_BASED_OUTPATIENT_CLINIC_OR_DEPARTMENT_OTHER): Payer: PPO

## 2020-11-13 ENCOUNTER — Emergency Department (HOSPITAL_BASED_OUTPATIENT_CLINIC_OR_DEPARTMENT_OTHER)
Admission: EM | Admit: 2020-11-13 | Discharge: 2020-11-14 | Disposition: A | Discharge status: Home / Self Care | Payer: PPO | Attending: Emergency Medicine | Admitting: Emergency Medicine

## 2020-11-13 ENCOUNTER — Encounter (HOSPITAL_BASED_OUTPATIENT_CLINIC_OR_DEPARTMENT_OTHER): Payer: Self-pay | Admitting: *Deleted

## 2020-11-13 ENCOUNTER — Other Ambulatory Visit: Payer: Self-pay

## 2020-11-13 DIAGNOSIS — Z87891 Personal history of nicotine dependence: Secondary | ICD-10-CM | POA: Diagnosis not present

## 2020-11-13 DIAGNOSIS — S2242XB Multiple fractures of ribs, left side, initial encounter for open fracture: Secondary | ICD-10-CM

## 2020-11-13 DIAGNOSIS — G9389 Other specified disorders of brain: Secondary | ICD-10-CM | POA: Diagnosis not present

## 2020-11-13 DIAGNOSIS — M47819 Spondylosis without myelopathy or radiculopathy, site unspecified: Secondary | ICD-10-CM | POA: Diagnosis not present

## 2020-11-13 DIAGNOSIS — I129 Hypertensive chronic kidney disease with stage 1 through stage 4 chronic kidney disease, or unspecified chronic kidney disease: Secondary | ICD-10-CM | POA: Insufficient documentation

## 2020-11-13 DIAGNOSIS — Z79899 Other long term (current) drug therapy: Secondary | ICD-10-CM | POA: Diagnosis not present

## 2020-11-13 DIAGNOSIS — Z85828 Personal history of other malignant neoplasm of skin: Secondary | ICD-10-CM | POA: Diagnosis not present

## 2020-11-13 DIAGNOSIS — S2242XA Multiple fractures of ribs, left side, initial encounter for closed fracture: Secondary | ICD-10-CM | POA: Insufficient documentation

## 2020-11-13 DIAGNOSIS — N183 Chronic kidney disease, stage 3 unspecified: Secondary | ICD-10-CM | POA: Diagnosis not present

## 2020-11-13 DIAGNOSIS — Z7982 Long term (current) use of aspirin: Secondary | ICD-10-CM | POA: Diagnosis not present

## 2020-11-13 DIAGNOSIS — Z91040 Latex allergy status: Secondary | ICD-10-CM | POA: Diagnosis not present

## 2020-11-13 DIAGNOSIS — S299XXA Unspecified injury of thorax, initial encounter: Secondary | ICD-10-CM | POA: Diagnosis present

## 2020-11-13 DIAGNOSIS — Z043 Encounter for examination and observation following other accident: Secondary | ICD-10-CM | POA: Diagnosis not present

## 2020-11-13 DIAGNOSIS — S4992XA Unspecified injury of left shoulder and upper arm, initial encounter: Secondary | ICD-10-CM | POA: Diagnosis not present

## 2020-11-13 MED ORDER — LIDOCAINE 5 % EX PTCH
2.0000 | MEDICATED_PATCH | CUTANEOUS | Status: DC
  Administered 2020-11-13: 2 via TRANSDERMAL
  Filled 2020-11-13: qty 2

## 2020-11-13 MED ORDER — ACETAMINOPHEN 500 MG PO TABS
1000.0000 mg | ORAL_TABLET | Freq: Once | ORAL | Status: AC
  Administered 2020-11-13: 1000 mg via ORAL
  Filled 2020-11-13: qty 2

## 2020-11-13 MED ORDER — KETOROLAC TROMETHAMINE 60 MG/2ML IM SOLN: 30.0000 mg | Freq: Once | INTRAMUSCULAR | Status: DC

## 2020-11-13 NOTE — ED Notes (Signed)
Patient transported to CT 
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
## 2020-11-14 ENCOUNTER — Other Ambulatory Visit (HOSPITAL_BASED_OUTPATIENT_CLINIC_OR_DEPARTMENT_OTHER): Payer: Self-pay

## 2020-11-14 ENCOUNTER — Other Ambulatory Visit: Payer: Self-pay | Admitting: Family Medicine

## 2020-11-14 ENCOUNTER — Encounter: Payer: Self-pay | Admitting: Family Medicine

## 2020-11-14 DIAGNOSIS — S2249XD Multiple fractures of ribs, unspecified side, subsequent encounter for fracture with routine healing: Secondary | ICD-10-CM

## 2020-11-14 DIAGNOSIS — M546 Pain in thoracic spine: Secondary | ICD-10-CM

## 2020-11-14 MED ORDER — TRAMADOL HCL 50 MG PO TABS
50.0000 mg | ORAL_TABLET | Freq: Three times a day (TID) | ORAL | 0 refills | Status: DC | PRN
  Filled 2020-11-14: qty 15, 5d supply, fill #0

## 2020-11-14 MED ORDER — LIDOCAINE 5 % EX PTCH: 1.0000 | MEDICATED_PATCH | CUTANEOUS | 0 refills | Status: DC

## 2020-11-14 MED ORDER — DICLOFENAC SODIUM 1 % EX GEL: 4.0000 g | Freq: Four times a day (QID) | CUTANEOUS | 0 refills | Status: DC

## 2020-11-14 NOTE — ED Notes (Signed)
Pt ambulatory with steady gait to restroom at this time. Pt denies any pain while walking. Only pain when changing positions. Wife at bedside at this time.

## 2020-11-14 NOTE — Telephone Encounter (Signed)
Tramadol sent down stairs

## 2020-11-17 ENCOUNTER — Ambulatory Visit: Payer: PPO | Admitting: Podiatry

## 2020-11-17 ENCOUNTER — Other Ambulatory Visit: Payer: Self-pay

## 2020-11-17 DIAGNOSIS — M79676 Pain in unspecified toe(s): Secondary | ICD-10-CM | POA: Diagnosis not present

## 2020-11-17 DIAGNOSIS — B351 Tinea unguium: Secondary | ICD-10-CM | POA: Diagnosis not present
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
## 2020-11-18 ENCOUNTER — Ambulatory Visit (INDEPENDENT_AMBULATORY_CARE_PROVIDER_SITE_OTHER): Payer: PPO | Admitting: Family Medicine

## 2020-11-18 ENCOUNTER — Encounter: Payer: Self-pay | Admitting: Family Medicine

## 2020-11-18 ENCOUNTER — Other Ambulatory Visit (HOSPITAL_BASED_OUTPATIENT_CLINIC_OR_DEPARTMENT_OTHER): Payer: Self-pay

## 2020-11-18 VITALS — BP 120/78 | HR 75 | Temp 98.3°F | Resp 20 | Ht 70.6 in | Wt 213.6 lb

## 2020-11-18 DIAGNOSIS — S2249XA Multiple fractures of ribs, unspecified side, initial encounter for closed fracture: Secondary | ICD-10-CM | POA: Diagnosis not present

## 2020-11-18 MED ORDER — OXYCODONE-ACETAMINOPHEN 5-325 MG PO TABS
1.0000 | ORAL_TABLET | ORAL | 0 refills | Status: DC | PRN
Start: 2020-11-18 — End: 2020-11-24
  Filled 2020-11-18: qty 30, 5d supply, fill #0

## 2020-11-18 NOTE — Patient Instructions (Signed)
Rib Fracture  A rib fracture is a break or crack in one of the bones of the ribs. The ribs are long, curved bones that wrap around your chest and attach to your spine and your breastbone. The ribs protect your heart, lungs, and other organs in the chest. A broken or cracked rib is often painful but is not usually serious. Most rib fractures heal on their own over time. However, rib fractures can be more serious if multiple ribs are broken or if broken ribs move out of place and push against other structures or organs. What are the causes? This condition is caused by:  Repetitive movements with high force, such as pitching a baseball or having very bad coughing spells.  A direct hit the chest, such as a sports injury, a car crash, or a fall.  Cancer that has spread to the bones, which can weaken bones and cause them to break. What are the signs or symptoms? Symptoms of this condition include:  Pain when you breathe in or cough.  Pain when someone presses on the injured area.  Feeling short of breath. How is this diagnosed? This condition is diagnosed with a physical exam and medical history. You may also have imaging tests, such as:  Chest X-ray.  CT scan.  MRI.  Bone scan.  Chest ultrasound. How is this treated? Treatment for this condition depends on the severity of the fracture. Most rib fractures usually heal on their own in 1-3 months. Healing may take longer if you have a cough or if you do activities that make the injury worse. While you heal, you may be given medicines to control the pain. You will also be taught deep breathing exercises. Severe injuries may require hospitalization or surgery. Follow these instructions at home: Managing pain, stiffness, and swelling  If directed, put ice on the injured area. To do this: ? Put ice in a plastic bag. ? Place a towel between your skin and the bag. ? Leave the ice on for 20 minutes, 2-3 times a day. ? Remove the ice if  your skin turns bright red. This is very important. If you cannot feel pain, heat, or cold, you have a greater risk of damage to the area.  Take over-the-counter and prescription medicines only as told by your health care provider. Activity  Avoid doing activities or movements that cause pain. Be careful during activities and avoid bumping the injured rib.  Slowly increase your activity as told by your health care provider. General instructions  Do deep breathing exercises as told by your health care provider. This helps prevent pneumonia, which is a common complication of a broken rib. Your health care provider may instruct you to: ? Take deep breaths several times a day. ? Try to cough several times a day, holding a pillow against the injured area. ? Use a device called incentive spirometer to practice deep breathing several times a day.  Drink enough fluid to keep your urine pale yellow.  Do not wear a rib belt or binder. These restrict breathing, which can lead to pneumonia.  Keep all follow-up visits. This is important. Contact a health care provider if:  You have a fever. Get help right away if:  You have difficulty breathing or you are short of breath.  You develop a cough that does not stop, or you cough up thick or bloody sputum.  You have nausea, vomiting, or pain in your abdomen.  Your pain gets worse and medicine  does not help. These symptoms may represent a serious problem that is an emergency. Do not wait to see if the symptoms will go away. Get medical help right away. Call your local emergency services (911 in the [place] Do not drive yourself to the hospital. Summary  A rib fracture is a break or crack in one of the bones of the ribs.  A broken or cracked rib is often painful but is not usually serious.  Most rib fractures heal on their own over time.  Treatment for this condition depends on the severity of the fracture.  Avoid doing any activities or  movements that cause pain. This information is not intended to replace advice given to you by your health care provider. Make sure you discuss any questions you have with your health care provider. Document Revised: 09/21/2019 Document Reviewed: 09/21/2019 Elsevier Patient Education  2021 [employer]  
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
## 2020-11-18 NOTE — Assessment & Plan Note (Signed)
D/c tramadol-- not effective Oxycodone/ asa sent in  Call if no relief  Er visit and xrays reviewed

## 2020-11-21 ENCOUNTER — Other Ambulatory Visit (HOSPITAL_BASED_OUTPATIENT_CLINIC_OR_DEPARTMENT_OTHER): Payer: Self-pay

## 2020-11-21 ENCOUNTER — Other Ambulatory Visit: Payer: Self-pay

## 2020-11-22 ENCOUNTER — Encounter: Payer: Self-pay | Admitting: Family Medicine

## 2020-11-24 ENCOUNTER — Other Ambulatory Visit: Payer: Self-pay | Admitting: Family Medicine

## 2020-11-24 ENCOUNTER — Other Ambulatory Visit (HOSPITAL_BASED_OUTPATIENT_CLINIC_OR_DEPARTMENT_OTHER): Payer: Self-pay

## 2020-11-24 DIAGNOSIS — S2249XD Multiple fractures of ribs, unspecified side, subsequent encounter for fracture with routine healing: Secondary | ICD-10-CM

## 2020-11-24 DIAGNOSIS — N183 Chronic kidney disease, stage 3 unspecified: Secondary | ICD-10-CM | POA: Diagnosis not present

## 2020-11-24 MED ORDER — TRAMADOL HCL 50 MG PO TABS
ORAL_TABLET | ORAL | 0 refills | Status: DC
  Filled 2020-11-24: qty 40, 5d supply, fill #0

## 2020-12-01 DIAGNOSIS — N210 Calculus in bladder: Secondary | ICD-10-CM | POA: Diagnosis not present

## 2020-12-01 DIAGNOSIS — I129 Hypertensive chronic kidney disease with stage 1 through stage 4 chronic kidney disease, or unspecified chronic kidney disease: Secondary | ICD-10-CM | POA: Diagnosis not present

## 2020-12-01 DIAGNOSIS — W19XXXA Unspecified fall, initial encounter: Secondary | ICD-10-CM | POA: Diagnosis not present

## 2020-12-01 DIAGNOSIS — D631 Anemia in chronic kidney disease: Secondary | ICD-10-CM | POA: Diagnosis not present

## 2020-12-01 DIAGNOSIS — N400 Enlarged prostate without lower urinary tract symptoms: Secondary | ICD-10-CM | POA: Diagnosis not present

## 2020-12-01 DIAGNOSIS — E559 Vitamin D deficiency, unspecified: Secondary | ICD-10-CM | POA: Diagnosis not present

## 2020-12-01 DIAGNOSIS — N1832 Chronic kidney disease, stage 3b: Secondary | ICD-10-CM | POA: Diagnosis not present

## 2020-12-03 ENCOUNTER — Other Ambulatory Visit (HOSPITAL_BASED_OUTPATIENT_CLINIC_OR_DEPARTMENT_OTHER): Payer: Self-pay

## 2020-12-07 ENCOUNTER — Encounter: Payer: Self-pay | Admitting: Family Medicine

## 2020-12-08 ENCOUNTER — Other Ambulatory Visit (HOSPITAL_BASED_OUTPATIENT_CLINIC_OR_DEPARTMENT_OTHER): Payer: Self-pay

## 2020-12-08 ENCOUNTER — Other Ambulatory Visit: Payer: Self-pay | Admitting: Family Medicine

## 2020-12-08 ENCOUNTER — Telehealth: Payer: Self-pay

## 2020-12-08 MED ORDER — LIDOCAINE 5 % EX PTCH: 1.0000 | MEDICATED_PATCH | CUTANEOUS | 0 refills | Status: DC

## 2020-12-08 MED ORDER — LIDOCAINE 5 % EX PTCH
1.0000 | MEDICATED_PATCH | CUTANEOUS | 0 refills | Status: DC
  Filled 2020-12-08 – 2020-12-12 (×3): qty 30, 30d supply, fill #0

## 2020-12-08 NOTE — Telephone Encounter (Signed)
PA created Via CoverMyMeds  Keturah Barre Key: [id number] - PA Case ID: [id number] - Rx #: [id number]

## 2020-12-09 ENCOUNTER — Other Ambulatory Visit (HOSPITAL_BASED_OUTPATIENT_CLINIC_OR_DEPARTMENT_OTHER): Payer: Self-pay

## 2020-12-10 ENCOUNTER — Other Ambulatory Visit: Payer: Self-pay | Admitting: Family Medicine

## 2020-12-11 ENCOUNTER — Other Ambulatory Visit (HOSPITAL_BASED_OUTPATIENT_CLINIC_OR_DEPARTMENT_OTHER): Payer: Self-pay

## 2020-12-11 MED ORDER — FENOFIBRATE 160 MG PO TABS
ORAL_TABLET | Freq: Every day | ORAL | 0 refills | Status: DC
  Filled 2020-12-11: qty 90, 90d supply, fill #0

## 2020-12-11 NOTE — Telephone Encounter (Signed)
William RainierIsabelle [name] on June 29 PA Case: [id number], Status: Denied. Notification: Completed.

## 2020-12-11 NOTE — Telephone Encounter (Signed)
Appeal letter sent to William [name].

## 2020-12-12 ENCOUNTER — Other Ambulatory Visit (HOSPITAL_BASED_OUTPATIENT_CLINIC_OR_DEPARTMENT_OTHER): Payer: Self-pay

## 2020-12-16 DIAGNOSIS — Z20822 Contact with and (suspected) exposure to covid-19: Secondary | ICD-10-CM | POA: Diagnosis not present

## 2020-12-16 NOTE — Telephone Encounter (Signed)
Appeal denied on 12/12/20.   Drug must meet the following therapeutic criteria:  Pain associated with diabetic neuropathy Pain associated with cancer-related neuropathy. Post- herpetic neuralgia Chronic pain Osteoarthritis of the knee or hip

## 2020-12-17 NOTE — Telephone Encounter (Signed)
Mychart message sent.

## 2020-12-24 MED FILL — Amlodipine Besylate Tab 10 MG (Base Equivalent): ORAL | 90 days supply | Qty: 90 | Fill #1 | Status: AC

## 2020-12-25 ENCOUNTER — Other Ambulatory Visit (HOSPITAL_BASED_OUTPATIENT_CLINIC_OR_DEPARTMENT_OTHER): Payer: Self-pay

## 2020-12-30 ENCOUNTER — Encounter: Payer: Self-pay | Admitting: Family Medicine

## 2020-12-31 ENCOUNTER — Other Ambulatory Visit (HOSPITAL_BASED_OUTPATIENT_CLINIC_OR_DEPARTMENT_OTHER): Payer: Self-pay

## 2020-12-31 ENCOUNTER — Other Ambulatory Visit: Payer: Self-pay | Admitting: Family Medicine

## 2020-12-31 DIAGNOSIS — L231 Allergic contact dermatitis due to adhesives: Secondary | ICD-10-CM

## 2020-12-31 MED ORDER — TRIAMCINOLONE ACETONIDE 0.1 % EX CREA
1.0000 | TOPICAL_CREAM | Freq: Two times a day (BID) | CUTANEOUS | 0 refills | Status: DC
Start: 2020-12-31 — End: 2022-01-04
  Filled 2020-12-31: qty 30, 15d supply, fill #0

## 2021-01-01 ENCOUNTER — Other Ambulatory Visit (HOSPITAL_BASED_OUTPATIENT_CLINIC_OR_DEPARTMENT_OTHER): Payer: Self-pay

## 2021-01-12 ENCOUNTER — Other Ambulatory Visit: Payer: Self-pay | Admitting: Family Medicine

## 2021-01-12 ENCOUNTER — Telehealth: Payer: Self-pay | Admitting: Family Medicine

## 2021-01-12 ENCOUNTER — Encounter: Payer: Self-pay | Admitting: Family Medicine

## 2021-01-12 DIAGNOSIS — M545 Low back pain, unspecified: Secondary | ICD-10-CM

## 2021-01-12 DIAGNOSIS — R296 Repeated falls: Secondary | ICD-10-CM

## 2021-01-12 NOTE — Telephone Encounter (Signed)
Pt is needing referral to Physical Therapy on [address],.

## 2021-01-13 ENCOUNTER — Ambulatory Visit (HOSPITAL_BASED_OUTPATIENT_CLINIC_OR_DEPARTMENT_OTHER)
Admission: RE | Admit: 2021-01-13 | Discharge: 2021-01-13 | Disposition: A | Discharge status: Home / Self Care | Payer: PPO | Source: Ambulatory Visit | Attending: Family Medicine | Admitting: Family Medicine

## 2021-01-13 ENCOUNTER — Encounter: Payer: Self-pay | Admitting: Family Medicine

## 2021-01-13 ENCOUNTER — Other Ambulatory Visit: Payer: Self-pay

## 2021-01-13 ENCOUNTER — Other Ambulatory Visit (HOSPITAL_BASED_OUTPATIENT_CLINIC_OR_DEPARTMENT_OTHER): Payer: Self-pay

## 2021-01-13 ENCOUNTER — Ambulatory Visit (INDEPENDENT_AMBULATORY_CARE_PROVIDER_SITE_OTHER): Payer: PPO | Admitting: Family Medicine

## 2021-01-13 VITALS — BP 140/80 | HR 68 | Temp 98.5°F | Resp 20 | Ht 70.5 in | Wt 211.0 lb

## 2021-01-13 DIAGNOSIS — M549 Dorsalgia, unspecified: Secondary | ICD-10-CM

## 2021-01-13 DIAGNOSIS — S2249XD Multiple fractures of ribs, unspecified side, subsequent encounter for fracture with routine healing: Secondary | ICD-10-CM | POA: Diagnosis not present

## 2021-01-13 DIAGNOSIS — M546 Pain in thoracic spine: Secondary | ICD-10-CM | POA: Diagnosis not present

## 2021-01-13 DIAGNOSIS — M545 Low back pain, unspecified: Secondary | ICD-10-CM | POA: Diagnosis not present

## 2021-01-13 MED ORDER — TIZANIDINE HCL 4 MG PO TABS
4.0000 mg | ORAL_TABLET | Freq: Four times a day (QID) | ORAL | 0 refills | Status: DC | PRN
  Filled 2021-01-13: qty 30, 8d supply, fill #0

## 2021-01-13 MED ORDER — TRAMADOL HCL 50 MG PO TABS
ORAL_TABLET | ORAL | 0 refills | Status: DC
Start: 2021-01-13 — End: 2021-07-22
  Filled 2021-01-13: qty 40, 5d supply, fill #0

## 2021-01-13 NOTE — Progress Notes (Addendum)
Subjective:   By signing my name below, I, [name], attest that this documentation has been prepared under the direction and in the presence of Ann Held, DO. 01/13/2021     Patient ID: William [name], male    DOB: 1940/08/02, 80 y.o.   MRN: [id number]  Chief Complaint  Patient presents with   Back Pain    Pt fell last week and fell again on Sunday and now is having lower back pain. Pt states first fall was forward and second fall was backward.     HPI Patient is in today for a office visit.  He fell backwards last Friday and fell again on Sunday. He lost his balance while squatting and fell backwards for the first fall and could not get up without the assistance of EMT for both falls. Since falling he has excruciating lower back pain. He notes when falling backwards he hit the back of his head. He cannot walk for long distances due to the back pain. His pain does not radiate anywhere and stays just above his buttock. He has pain while getting up from a sitting position. He has tried taking 50 mg tramadol and a heating pad to manage his pain but found relief. He denies having any weakness in his legs while getting up. He is also interested in taking muscle relaxer to help manage his pain.  He also notes potentially having a bruise on one of his buttocks because of it being sore.  He is requesting a refill on 50 mg tramadol PRN.    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   HTN (hypertension) 07/19/2015    Hyperlipidemia    Hypertension    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   Mild, related to stroke history   Nocturia    Renal insufficiency 06/25/2013   S/P carotid endarterectomy    BILATERAL ICA--  PATENT PER DUPLEX  05-19-2012   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   Right calf   Urinary frequency    Vitamin D deficiency     Past Surgical History:  Procedure Laterality Date   APPENDECTOMY  AS CHILD   CARDIOVASCULAR STRESS TEST  03-27-2012  DR CRENSHAW   LOW RISK LEXISCAN STUDY-- PROBABLE NORMAL PERFUSION AND SOFT TISSUE ATTENUATION/  NO ISCHEMIA/ EF 51%   CAROTID ENDARTERECTOMY Bilateral LEFT  11-12-2008  DR GREG HAYES   RIGHT ICA  2006  ([facility])   CYSTOSCOPY WITH LITHOLAPAXY N/A 02/26/2013   Procedure: CYSTOSCOPY WITH LITHOLAPAXY;  Surgeon: Franchot Gallo, MD;  Location: [facility];  Service: Urology;  Laterality: N/A;   EYE SURGERY  Jan. 2016   cataract surgery both eyes   INGUINAL HERNIA REPAIR Right 11-08-2006   MASS EXCISION N/A 03/03/2016   Procedure: EXCISION OF BACK  MASS;  Surgeon: Stark Klein, MD;  Location: [place];  Service: General;  Laterality: N/A;   MOHS SURGERY Left 1/ 2016   Dr Nevada Crane-- Basal cell   PROSTATE SURGERY     TRANSURETHRAL RESECTION OF PROSTATE N/A 02/26/2013   Procedure: TRANSURETHRAL RESECTION OF THE PROSTATE WITH GYRUS INSTRUMENTS;  Surgeon: Franchot Gallo, MD;  Location: [facility];  Service: Urology;  Laterality: N/A;    Family History  Problem Relation Age of Onset   Heart disease Mother        CHF   Bipolar disorder Mother    Heart disease Father        CHF    Social History   Socioeconomic History   Marital status: Married    Spouse name: Not on file   Number of children: 2   Years of education: 12   Highest education level: High school graduate  Occupational History    Employer: Retired  Tobacco Use   Smoking status: Former     Packs/day: 2.00    Years: 40.00    Pack years: 80.00    Types: Cigarettes    Quit date: 02/15/2005    Years since quitting: 15.9   Smokeless tobacco: Never  Vaping Use   Vaping Use: Never used  Substance and Sexual Activity   Alcohol use: Yes    Alcohol/week: 0.0 standard drinks    Comment: Occasional   Drug use: No   Sexual activity: Yes    Partners: Female  Other Topics Concern   Not on file  Social History Narrative   Exercise--  Walks dogs everyday      LIves with wife , no stairs in home, caffeine - one cup coffee day, exercise - not much, Right handed, 12th grade, retired      One story home   Social Determinants of Health   Financial Resource Strain: Not on file  Food Insecurity: Not on file  Transportation Needs: Not on file  Physical Activity: Not on file  Stress: Not on file  Social Connections: Not on file  Intimate Partner Violence: Not on file    Outpatient Medications Prior to Visit  Medication Sig Dispense Refill   acetaminophen (TYLENOL) 500 MG tablet Take 500 mg by mouth every 6 (six) hours as needed (1 tab as needed).     amLODipine (NORVASC) 10 MG tablet TAKE 1 TABLET BY MOUTH DAILY 90 tablet 3   amLODipine (NORVASC) 10 MG tablet Take 1 tablet by mouth daily 90 tablet 3   amLODipine (NORVASC) 5 MG tablet TAKE 1 TABLET (5 MG TOTAL) BY MOUTH DAILY. (Patient taking differently: Take 10 mg by mouth daily.) 90 tablet 1   aspirin 325 MG EC tablet Take 325 mg by mouth daily.     carbamazepine (TEGRETOL XR) 100 MG 12 hr tablet TAKE 1 TABLET BY MOUTH EVERY MORNING AND 2 AT BEDTIME 270 tablet 1   Cholecalciferol (VITAMIN D) 2000 UNITS CAPS Take by mouth.     diclofenac Sodium (VOLTAREN) 1 % GEL Apply 4 g topically 4 (four) times daily. 100 g 0   docusate sodium (COLACE) 100 MG capsule Take 100 mg by mouth daily.     Eszopiclone 3 MG TABS TAKE 1 TABLET BY MOUTH IMMEDIATELY BEFORE BEDTIME AS NEEDED 90 tablet 2   famotidine (PEPCID) 20 MG tablet Take 20 mg by mouth  2 (two) times daily.     fenofibrate 160 MG tablet Take by mouth daily. 90 tablet 0   Flaxseed, Linseed, (FLAX SEED OIL PO) Take by mouth daily.  fluticasone (CUTIVATE) 0.05 % cream   3   influenza vaccine adjuvanted (FLUAD) 0.5 ML injection TAKE AS DIRECTED .5 mL 0   lidocaine (LIDODERM) 5 % Place 1 patch onto the skin daily. Remove & Discard patch within 12 hours or as directed by MD 30 patch 0   lisinopril-hydrochlorothiazide (ZESTORETIC) 10-12.5 MG tablet Take 1 tablet by mouth daily.     lisinopril-hydrochlorothiazide (ZESTORETIC) 10-12.5 MG tablet TAKE 1 TABLET BY MOUTH DAILY 30 tablet 5   lisinopril-hydrochlorothiazide (ZESTORETIC) 10-12.5 MG tablet Take 1 tablet by mouth daily 30 tablet 5   LORazepam (ATIVAN) 0.5 MG tablet TAKE 2 TABLETS BY MOUTH TWICE A DAY MAY TAKE 1 ADDITIONAL AS NEEDED 150 tablet 5   Multiple Vitamin (MULTIVITAMIN) tablet Take 1 tablet by mouth daily.     NONFORMULARY OR COMPOUNDED ITEM [facility]:  Antifungal topical - Terbinafine 3%, Fluconazole 2%, Tea Tree Oil 5%, Urea 10/%,, Ibuprofen 2% in DMSO Suspension #74ml. Apply to the affected nail(s) once (at bedtime) or twice daily. 100 each 2   ondansetron (ZOFRAN ODT) 4 MG disintegrating tablet Take 1 tablet (4 mg total) by mouth every 8 (eight) hours as needed for nausea or vomiting. 20 tablet 0   pantoprazole (PROTONIX) 40 MG tablet TAKE 1 TABLET (40 MG TOTAL) BY MOUTH 2 (TWO) TIMES DAILY BEFORE A MEAL. 60 tablet 2   Pitavastatin Calcium 1 MG TABS TAKE ONE TABLET BY MOUTH ONCE DAILY 30 tablet 2   sildenafil (VIAGRA) 100 MG tablet Take 1 tablet (100 mg total) by mouth as needed up to once daily. 10 tablet 11   tamsulosin (FLOMAX) 0.4 MG CAPS capsule TAKE 1 CAPSULE BY MOUTH ONCE DAILY 90 capsule 3   triamcinolone cream (KENALOG) 0.1 % Apply 1 application topically 2 (two) times daily. 30 g 0   traMADol (ULTRAM) 50 MG tablet Take 1 - 2 tablets by mouth every 6 hours as needed for pain 40 tablet 0   COVID-19  mRNA Vac-TriS, Pfizer, (PFIZER-BIONT COVID-19 VAC-TRIS) SUSP injection Inject into the muscle. (Patient not taking: Reported on 01/13/2021) 0.3 mL 1   methylPREDNISolone (MEDROL DOSEPAK) 4 MG TBPK tablet Take 6 tablets by mouth on day 1, then 5 tablets on day 2, then 4 tablets on day 3, then 3 tablets on day 4, then 2 tablets on day 5, then 1 tablet on day 6 then stop. (Patient not taking: Reported on 01/13/2021) 21 each 0   No facility-administered medications prior to visit.    Allergies  Allergen Reactions   Other Anaphylaxis   Strawberry Extract Hives   Adhesive [Tape] Other (See Comments)    BLISTER   Latex     Reaction to Plastic Tape   Statins Other (See Comments)    myalgias   Strawberry (Diagnostic) Hives   Zetia [Ezetimibe] Other (See Comments)    Intolerance     Review of Systems  Constitutional:  Negative for chills, fever and malaise/fatigue.  HENT:  Negative for congestion and hearing loss.   Eyes:  Negative for blurred vision and discharge.  Respiratory:  Negative for cough, sputum production and shortness of breath.   Cardiovascular:  Negative for chest pain, palpitations and leg swelling.  Gastrointestinal:  Negative for abdominal pain, blood in stool, constipation, diarrhea, heartburn, nausea and vomiting.  Genitourinary:  Negative for dysuria, frequency, hematuria and urgency.  Musculoskeletal:  Positive for back pain (low back pain), falls (2 falls this past weekned) and myalgias (buttock).  Skin:  Negative for rash.  Neurological:  Negative for dizziness, sensory change, loss of consciousness, weakness and headaches.  Endo/Heme/Allergies:  Negative for environmental allergies. Does not bruise/bleed easily.  Psychiatric/Behavioral:  Negative for depression and suicidal ideas. The patient is not nervous/anxious and does not have insomnia.       Objective:    Physical Exam Vitals and nursing note reviewed.  Constitutional:      General: He is not in acute  distress.    Appearance: Normal appearance. He is not ill-appearing.  HENT:     Head: Normocephalic and atraumatic.     Right Ear: External ear normal.     Left Ear: External ear normal.  Eyes:     Extraocular Movements: Extraocular movements intact.     Pupils: Pupils are equal, round, and reactive to light.  Cardiovascular:     Rate and Rhythm: Normal rate and regular rhythm.     Heart sounds: Normal heart sounds. No murmur heard.   No gallop.  Pulmonary:     Effort: Pulmonary effort is normal. No respiratory distress.     Breath sounds: Normal breath sounds. No wheezing or rales.  Musculoskeletal:        General: Tenderness present.     Lumbar back: Spasms and tenderness present. Decreased range of motion. Negative right straight leg raise test and negative left straight leg raise test.     Comments: Low back pain, no radiation of pain  Skin:    General: Skin is warm and dry.  Neurological:     Mental Status: He is alert and oriented to person, place, and time.  Psychiatric:        Behavior: Behavior normal.    BP 140/80 (BP Location: Left Arm, Patient Position: Sitting, Cuff Size: Normal)   Pulse 68   Temp 98.5 F (36.9 C) (Oral)   Resp 20   Ht 5' 10.5" (1.791 m)   Wt 211 lb (95.7 kg)   SpO2 96%   BMI 29.85 kg/m  Wt Readings from Last 3 Encounters:  01/13/21 211 lb (95.7 kg)  11/18/20 213 lb 9.6 oz (96.9 kg)  11/13/20 216 lb (98 kg)    Diabetic Foot Exam - Simple   No data filed    Lab Results  Component Value Date   WBC 7.2 01/29/2020   HGB 12.6 (L) 01/29/2020   HCT 37.6 (L) 01/29/2020   PLT 262.0 01/29/2020   GLUCOSE 101 (H) 07/31/2020   CHOL 208 (H) 07/31/2020   TRIG 88.0 07/31/2020   HDL 56.70 07/31/2020   LDLCALC 133 (H) 07/31/2020   ALT 14 07/31/2020   AST 17 07/31/2020   NA 141 07/31/2020   K 4.3 07/31/2020   CL 106 07/31/2020   CREATININE 2.10 (H) 07/31/2020   BUN 36 (H) 07/31/2020   CO2 27 07/31/2020   TSH 1.220 04/25/2019   PSA 2.10  12/24/2016   INR 1.0 11/08/2008   HGBA1C 5.8 (H) 09/17/2019   MICROALBUR 7.4 (H) 07/31/2020    Lab Results  Component Value Date   TSH 1.220 04/25/2019   Lab Results  Component Value Date   WBC 7.2 01/29/2020   HGB 12.6 (L) 01/29/2020   HCT 37.6 (L) 01/29/2020   MCV 94.3 01/29/2020   PLT 262.0 01/29/2020   Lab Results  Component Value Date   NA 141 07/31/2020   K 4.3 07/31/2020   CO2 27 07/31/2020   GLUCOSE 101 (H) 07/31/2020   BUN 36 (H) 07/31/2020   CREATININE 2.10 (H) 07/31/2020  BILITOT 0.4 07/31/2020   ALKPHOS 54 07/31/2020   AST 17 07/31/2020   ALT 14 07/31/2020   PROT 7.3 07/31/2020   ALBUMIN 4.1 07/31/2020   CALCIUM 9.5 07/31/2020   ANIONGAP 8 03/01/2016   GFR 29.70 (L) 07/31/2020   Lab Results  Component Value Date   CHOL 208 (H) 07/31/2020   Lab Results  Component Value Date   HDL 56.70 07/31/2020   Lab Results  Component Value Date   LDLCALC 133 (H) 07/31/2020   Lab Results  Component Value Date   TRIG 88.0 07/31/2020   Lab Results  Component Value Date   CHOLHDL 4 07/31/2020   Lab Results  Component Value Date   HGBA1C 5.8 (H) 09/17/2019       Assessment & Plan:   Problem List Items Addressed This Visit       Unprioritized   Closed fracture of multiple ribs   Relevant Medications   traMADol (ULTRAM) 50 MG tablet   Other Visit Diagnoses     Acute midline low back pain without sciatica    -  Primary   Relevant Medications   traMADol (ULTRAM) 50 MG tablet   tiZANidine (ZANAFLEX) 4 MG tablet   Other Relevant Orders   DG Lumbar Spine Complete   Mid back pain       Relevant Medications   traMADol (ULTRAM) 50 MG tablet   tiZANidine (ZANAFLEX) 4 MG tablet   Other Relevant Orders   DG Thoracic Spine 2 View      Pt referred to PT  Check xray today  Meds ordered this encounter  Medications   traMADol (ULTRAM) 50 MG tablet    Sig: Take 1 - 2 tablets by mouth every 6 hours as needed for pain    Dispense:  40 tablet     Refill:  0    Percocet stopped   tiZANidine (ZANAFLEX) 4 MG tablet    Sig: Take 1 tablet (4 mg total) by mouth every 6 (six) hours as needed for muscle spasms.    Dispense:  30 tablet    Refill:  0    I, Ann Held, DO, personally preformed the services described in this documentation.  All medical record entries made by the scribe were at my direction and in my presence.  I have reviewed the chart and discharge instructions (if applicable) and agree that the record reflects my personal performance and is accurate and complete. 01/13/2021   I,[name],acting as a scribe for Ann Held, DO.,have documented all relevant documentation on the behalf of Ann Held, DO,as directed by  Ann Held, DO while in the presence of Ann Held, DO.   Ann Held, DO

## 2021-01-13 NOTE — Patient Instructions (Signed)
Acute Back Pain, Adult Acute back pain is sudden and usually short-lived. It is often caused by an injury to the muscles and tissues in the back. The injury may result from: A muscle or ligament getting overstretched or torn (strained). Ligaments are tissues that connect bones to each other. Lifting something improperly can cause a back strain. Wear and tear (degeneration) of the spinal disks. Spinal disks are circular tissue that provide cushioning between the bones of the spine (vertebrae). Twisting motions, such as while playing sports or doing yard work. A hit to the back. Arthritis. You may have a physical exam, lab tests, and imaging tests to find the cause ofyour pain. Acute back pain usually goes away with rest and home care. Follow these instructions at home: Managing pain, stiffness, and swelling Treatment may include medicines for pain and inflammation that are taken by mouth or applied to the skin, prescription pain medicine, or muscle relaxants. Take over-the-counter and prescription medicines only as told by your health care provider. Your health care provider may recommend applying ice during the first 24-48 hours after your pain starts. To do this: Put ice in a plastic bag. Place a towel between your skin and the bag. Leave the ice on for 20 minutes, 2-3 times a day. If directed, apply heat to the affected area as often as told by your health care provider. Use the heat source that your health care provider recommends, such as a moist heat pack or a heating pad. Place a towel between your skin and the heat source. Leave the heat on for 20-30 minutes. Remove the heat if your skin turns bright red. This is especially important if you are unable to feel pain, heat, or cold. You have a greater risk of getting burned. Activity  Do not stay in bed. Staying in bed for more than 1-2 days can delay your recovery. Sit up and stand up straight. Avoid leaning forward when you sit or  hunching over when you stand. If you work at a desk, sit close to it so you do not need to lean over. Keep your chin tucked in. Keep your neck drawn back, and keep your elbows bent at a 90-degree angle (right angle). Sit high and close to the steering wheel when you drive. Add lower back (lumbar) support to your car seat, if needed. Take short walks on even surfaces as soon as you are able. Try to increase the length of time you walk each day. Do not sit, drive, or stand in one place for more than 30 minutes at a time. Sitting or standing for long periods of time can put stress on your back. Do not drive or use heavy machinery while taking prescription pain medicine. Use proper lifting techniques. When you bend and lift, use positions that put less stress on your back: Bend your knees. Keep the load close to your body. Avoid twisting. Exercise regularly as told by your health care provider. Exercising helps your back heal faster and helps prevent back injuries by keeping muscles strong and flexible. Work with a physical therapist to make a safe exercise program, as recommended by your health care provider. Do any exercises as told by your physical therapist.  Lifestyle Maintain a healthy weight. Extra weight puts stress on your back and makes it difficult to have good posture. Avoid activities or situations that make you feel anxious or stressed. Stress and anxiety increase muscle tension and can make back pain worse. Learn ways to manage   anxiety and stress, such as through exercise. General instructions Sleep on a firm mattress in a comfortable position. Try lying on your side with your knees slightly bent. If you lie on your back, put a pillow under your knees. Follow your treatment plan as told by your health care provider. This may include: Cognitive or behavioral therapy. Acupuncture or massage therapy. Meditation or yoga. Contact a health care provider if: You have pain that is not  relieved with rest or medicine. You have increasing pain going down into your legs or buttocks. Your pain does not improve after 2 weeks. You have pain at night. You lose weight without trying. You have a fever or chills. Get help right away if: You develop new bowel or bladder control problems. You have unusual weakness or numbness in your arms or legs. You develop nausea or vomiting. You develop abdominal pain. You feel faint. Summary Acute back pain is sudden and usually short-lived. Use proper lifting techniques. When you bend and lift, use positions that put less stress on your back. Take over-the-counter and prescription medicines and apply heat or ice as directed by your health care provider. This information is not intended to replace advice given to you by your health care provider. Make sure you discuss any questions you have with your healthcare provider. Document Revised: 02/19/2020 Document Reviewed: 02/22/2020 Elsevier Patient Education  2022 [employer]  

## 2021-01-14 ENCOUNTER — Encounter: Payer: Self-pay | Admitting: Family Medicine

## 2021-01-21 ENCOUNTER — Other Ambulatory Visit: Payer: Self-pay | Admitting: Family Medicine

## 2021-01-21 DIAGNOSIS — N401 Enlarged prostate with lower urinary tract symptoms: Secondary | ICD-10-CM

## 2021-01-21 DIAGNOSIS — R3911 Hesitancy of micturition: Secondary | ICD-10-CM

## 2021-01-22 ENCOUNTER — Other Ambulatory Visit (HOSPITAL_BASED_OUTPATIENT_CLINIC_OR_DEPARTMENT_OTHER): Payer: Self-pay

## 2021-01-22 MED ORDER — TAMSULOSIN HCL 0.4 MG PO CAPS
ORAL_CAPSULE | Freq: Every day | ORAL | 3 refills | Status: DC
  Filled 2021-01-22: qty 90, 90d supply, fill #0
  Filled 2021-04-21: qty 90, 90d supply, fill #1

## 2021-01-23 ENCOUNTER — Other Ambulatory Visit: Payer: Self-pay | Admitting: Family Medicine

## 2021-01-23 ENCOUNTER — Encounter: Payer: Self-pay | Admitting: Family Medicine

## 2021-01-23 DIAGNOSIS — M5416 Radiculopathy, lumbar region: Secondary | ICD-10-CM

## 2021-01-28 ENCOUNTER — Other Ambulatory Visit (HOSPITAL_BASED_OUTPATIENT_CLINIC_OR_DEPARTMENT_OTHER): Payer: Self-pay

## 2021-01-28 ENCOUNTER — Other Ambulatory Visit: Payer: Self-pay

## 2021-01-28 ENCOUNTER — Ambulatory Visit (INDEPENDENT_AMBULATORY_CARE_PROVIDER_SITE_OTHER): Payer: PPO | Admitting: Psychiatry

## 2021-01-28 DIAGNOSIS — F4323 Adjustment disorder with mixed anxiety and depressed mood: Secondary | ICD-10-CM

## 2021-01-28 DIAGNOSIS — F0630 Mood disorder due to known physiological condition, unspecified: Secondary | ICD-10-CM | POA: Diagnosis not present

## 2021-01-28 MED ORDER — ESZOPICLONE 3 MG PO TABS
ORAL_TABLET | ORAL | 2 refills | Status: DC
  Filled 2021-01-28: qty 90, 90d supply, fill #0

## 2021-01-28 MED ORDER — CARBAMAZEPINE ER 100 MG PO TB12
ORAL_TABLET | ORAL | 1 refills | Status: DC
  Filled 2021-01-28: qty 270, 90d supply, fill #0

## 2021-01-28 MED ORDER — LORAZEPAM 0.5 MG PO TABS
ORAL_TABLET | ORAL | 5 refills | Status: DC
  Filled 2021-01-28 – 2021-02-25 (×2): qty 150, 30d supply, fill #0
  Filled 2021-04-01: qty 150, 30d supply, fill #1
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
## 2021-01-29 ENCOUNTER — Other Ambulatory Visit (HOSPITAL_BASED_OUTPATIENT_CLINIC_OR_DEPARTMENT_OTHER): Payer: Self-pay

## 2021-01-29 ENCOUNTER — Ambulatory Visit
Admission: RE | Admit: 2021-01-29 | Discharge: 2021-01-29 | Disposition: A | Discharge status: Home / Self Care | Payer: PPO | Source: Ambulatory Visit | Attending: Family Medicine | Admitting: Family Medicine

## 2021-01-29 DIAGNOSIS — M5416 Radiculopathy, lumbar region: Secondary | ICD-10-CM

## 2021-01-29 DIAGNOSIS — M545 Low back pain, unspecified: Secondary | ICD-10-CM | POA: Diagnosis not present

## 2021-01-31 ENCOUNTER — Encounter: Payer: Self-pay | Admitting: Family Medicine

## 2021-02-01 ENCOUNTER — Other Ambulatory Visit: Payer: Self-pay | Admitting: Family Medicine

## 2021-02-01 DIAGNOSIS — S32019A Unspecified fracture of first lumbar vertebra, initial encounter for closed fracture: Secondary | ICD-10-CM

## 2021-02-02 ENCOUNTER — Other Ambulatory Visit: Payer: Self-pay

## 2021-02-02 ENCOUNTER — Ambulatory Visit: Payer: PPO | Admitting: Physical Therapy

## 2021-02-02 NOTE — Telephone Encounter (Signed)
See result note.  

## 2021-02-03 ENCOUNTER — Encounter: Payer: Self-pay | Admitting: Family Medicine

## 2021-02-03 ENCOUNTER — Ambulatory Visit (INDEPENDENT_AMBULATORY_CARE_PROVIDER_SITE_OTHER): Payer: PPO | Admitting: Family Medicine

## 2021-02-03 VITALS — BP 122/84 | HR 86 | Temp 98.7°F | Resp 18 | Ht 70.5 in | Wt 207.6 lb

## 2021-02-03 DIAGNOSIS — F0390 Unspecified dementia without behavioral disturbance: Secondary | ICD-10-CM

## 2021-02-03 DIAGNOSIS — Z0000 Encounter for general adult medical examination without abnormal findings: Secondary | ICD-10-CM | POA: Diagnosis not present

## 2021-02-03 DIAGNOSIS — N289 Disorder of kidney and ureter, unspecified: Secondary | ICD-10-CM

## 2021-02-03 DIAGNOSIS — E785 Hyperlipidemia, unspecified: Secondary | ICD-10-CM

## 2021-02-03 DIAGNOSIS — Z79899 Other long term (current) drug therapy: Secondary | ICD-10-CM | POA: Diagnosis not present

## 2021-02-03 DIAGNOSIS — R3911 Hesitancy of micturition: Secondary | ICD-10-CM

## 2021-02-03 DIAGNOSIS — N401 Enlarged prostate with lower urinary tract symptoms: Secondary | ICD-10-CM

## 2021-02-03 DIAGNOSIS — I10 Essential (primary) hypertension: Secondary | ICD-10-CM

## 2021-02-03 DIAGNOSIS — I6523 Occlusion and stenosis of bilateral carotid arteries: Secondary | ICD-10-CM

## 2021-02-03 DIAGNOSIS — S32019D Unspecified fracture of first lumbar vertebra, subsequent encounter for fracture with routine healing: Secondary | ICD-10-CM | POA: Diagnosis not present

## 2021-02-03 DIAGNOSIS — M4856XA Collapsed vertebra, not elsewhere classified, lumbar region, initial encounter for fracture: Secondary | ICD-10-CM | POA: Diagnosis not present

## 2021-02-03 LAB — CBC WITH DIFFERENTIAL/PLATELET
Basophils Absolute: 0 10*3/uL (ref 0.0–0.1)
Basophils Relative: 0.6 % (ref 0.0–3.0)
Eosinophils Absolute: 0.2 10*3/uL (ref 0.0–0.7)
Eosinophils Relative: 2.3 % (ref 0.0–5.0)
HCT: 35.6 % — ABNORMAL LOW (ref 39.0–52.0)
Hemoglobin: 11.9 g/dL — ABNORMAL LOW (ref 13.0–17.0)
Lymphocytes Relative: 24.2 % (ref 12.0–46.0)
Lymphs Abs: 1.8 10*3/uL (ref 0.7–4.0)
MCHC: 33.5 g/dL (ref 30.0–36.0)
MCV: 94.8 fl (ref 78.0–100.0)
Monocytes Absolute: 0.5 10*3/uL (ref 0.1–1.0)
Monocytes Relative: 7.1 % (ref 3.0–12.0)
Neutro Abs: 4.8 10*3/uL (ref 1.4–7.7)
Neutrophils Relative %: 65.8 % (ref 43.0–77.0)
Platelets: 282 10*3/uL (ref 150.0–400.0)
RBC: 3.75 Mil/uL — ABNORMAL LOW (ref 4.22–5.81)
RDW: 13.9 % (ref 11.5–15.5)
WBC: 7.3 10*3/uL (ref 4.0–10.5)

## 2021-02-03 LAB — COMPREHENSIVE METABOLIC PANEL
ALT: 11 U/L (ref 0–53)
AST: 16 U/L (ref 0–37)
Albumin: 4.1 g/dL (ref 3.5–5.2)
Alkaline Phosphatase: 127 U/L — ABNORMAL HIGH (ref 39–117)
BUN: 32 mg/dL — ABNORMAL HIGH (ref 6–23)
CO2: 25 mEq/L (ref 19–32)
Calcium: 9.4 mg/dL (ref 8.4–10.5)
Chloride: 105 mEq/L (ref 96–112)
Creatinine, Ser: 1.92 mg/dL — ABNORMAL HIGH (ref 0.40–1.50)
GFR: 32.95 mL/min — ABNORMAL LOW (ref >60.00)
Glucose, Bld: 92 mg/dL (ref 70–99)
Potassium: 4 mEq/L (ref 3.5–5.1)
Sodium: 140 mEq/L (ref 135–145)
Total Bilirubin: 0.5 mg/dL (ref 0.2–1.2)
Total Protein: 7.3 g/dL (ref 6.0–8.3)

## 2021-02-03 LAB — LIPID PANEL
Cholesterol: 189 mg/dL (ref 0–200)
HDL: 53.6 mg/dL (ref >39.00)
LDL Cholesterol: 115 mg/dL — ABNORMAL HIGH (ref 0–99)
NonHDL: 135.67
Total CHOL/HDL Ratio: 4
Triglycerides: 102 mg/dL (ref 0.0–149.0)
VLDL: 20.4 mg/dL (ref 0.0–40.0)

## 2021-02-03 NOTE — Assessment & Plan Note (Signed)
Well controlled, no changes to meds. Encouraged heart healthy diet such as the DASH diet and exercise as tolerated.  °

## 2021-02-03 NOTE — Assessment & Plan Note (Signed)
Per u rology 

## 2021-02-03 NOTE — Assessment & Plan Note (Signed)
con't meds F/u carotid doppler

## 2021-02-03 NOTE — Assessment & Plan Note (Signed)
ghm utd Check labs  See avs  

## 2021-02-03 NOTE — Assessment & Plan Note (Signed)
Encourage heart healthy diet such as MIND or DASH diet, increase exercise, avoid trans fats, simple carbohydrates and processed foods, consider a krill or fish or flaxseed oil cap daily.  °

## 2021-02-03 NOTE — Assessment & Plan Note (Signed)
Stable

## 2021-02-03 NOTE — Assessment & Plan Note (Signed)
Per ortho.  

## 2021-02-03 NOTE — Patient Instructions (Signed)
Preventive Care 65 Years and Older, Male Preventive care refers to lifestyle choices and visits with your health care provider that can promote health and wellness. This includes: A yearly physical exam. This is also called an annual wellness visit. Regular dental and eye exams. Immunizations. Screening for certain conditions. Healthy lifestyle choices, such as: Eating a healthy diet. Getting regular exercise. Not using drugs or products that contain nicotine and tobacco. Limiting alcohol use. What can I expect for my preventive care visit? Physical exam Your health care provider will check your: Height and weight. These may be used to calculate your BMI (body mass index). BMI is a measurement that tells if you are at a healthy weight. Heart rate and blood pressure. Body temperature. Skin for abnormal spots. Counseling Your health care provider may ask you questions about your: Past medical problems. Family's medical history. Alcohol, tobacco, and drug use. Emotional well-being. Home life and relationship well-being. Sexual activity. Diet, exercise, and sleep habits. History of falls. Memory and ability to understand (cognition). Work and work environment. Access to firearms. What immunizations do I need?  Vaccines are usually given at various ages, according to a schedule. Your health care provider will recommend vaccines for you based on your age, medicalhistory, and lifestyle or other factors, such as travel or where you work. What tests do I need? Blood tests Lipid and cholesterol levels. These may be checked every 5 years, or more often depending on your overall health. Hepatitis C test. Hepatitis B test. Screening Lung cancer screening. You may have this screening every year starting at age 55 if you have a 30-pack-year history of smoking and currently smoke or have quit within the past 15 years. Colorectal cancer screening. All adults should have this screening  starting at age 50 and continuing until age 75. Your health care provider may recommend screening at age 45 if you are at increased risk. You will have tests every 1-10 years, depending on your results and the type of screening test. Prostate cancer screening. Recommendations will vary depending on your family history and other risks. Genital exam to check for testicular cancer or hernias. Diabetes screening. This is done by checking your blood sugar (glucose) after you have not eaten for a while (fasting). You may have this done every 1-3 years. Abdominal aortic aneurysm (AAA) screening. You may need this if you are a current or former smoker. STD (sexually transmitted disease) testing, if you are at risk. Follow these instructions at home: Eating and drinking  Eat a diet that includes fresh fruits and vegetables, whole grains, lean protein, and low-fat dairy products. Limit your intake of foods with high amounts of sugar, saturated fats, and salt. Take vitamin and mineral supplements as recommended by your health care provider. Do not drink alcohol if your health care provider tells you not to drink. If you drink alcohol: Limit how much you have to 0-2 drinks a day. Be aware of how much alcohol is in your drink. In the [place] one drink equals one 12 oz bottle of beer (355 mL), one 5 oz glass of wine (148 mL), or one 1 oz glass of hard liquor (44 mL).  Lifestyle Take daily care of your teeth and gums. Brush your teeth every morning and night with fluoride toothpaste. Floss one time each day. Stay active. Exercise for at least 30 minutes 5 or more days each week. Do not use any products that contain nicotine or tobacco, such as cigarettes, e-cigarettes, and chewing tobacco.   If you need help quitting, ask your health care provider. Do not use drugs. If you are sexually active, practice safe sex. Use a condom or other form of protection to prevent STIs (sexually transmitted infections). Talk  with your health care provider about taking a low-dose aspirin or statin. Find healthy ways to cope with stress, such as: Meditation, yoga, or listening to music. Journaling. Talking to a trusted person. Spending time with friends and family. Safety Always wear your seat belt while driving or riding in a vehicle. Do not drive: If you have been drinking alcohol. Do not ride with someone who has been drinking. When you are tired or distracted. While texting. Wear a helmet and other protective equipment during sports activities. If you have firearms in your house, make sure you follow all gun safety procedures. What's next? Visit your health care provider once a year for an annual wellness visit. Ask your health care provider how often you should have your eyes and teeth checked. Stay up to date on all vaccines. This information is not intended to replace advice given to you by your health care provider. Make sure you discuss any questions you have with your healthcare provider. Document Revised: 02/27/2019 Document Reviewed: 05/25/2018 Elsevier Patient Education  2022 [employer]  

## 2021-02-03 NOTE — Progress Notes (Signed)
Subjective:   By signing my name below, I, [name], attest that this documentation has been prepared under the direction and in the presence of Dr. Roma Schanz, DO. 02/03/2021      Patient ID: William [name], male    DOB: 02-Jul-1940, 80 y.o.   MRN: [id number]  Chief Complaint  Patient presents with   Annual Exam    Pt states fasting     HPI Patient is in today for a comprehensive physical exam. His wife is present during this visit to assist with transportation. He is requesting for his tegretol level to be checked during his next lab work because of his psychiatrist needing it for his medication.  He reports that he recently fractured his L1 vertebrae. He is currently in pain and is taking 50 mg tramadol PRN and 4 mg Zanaflex PRN to manage his pain and the tramadol makes him constipated.   He prefers not to take strong pain medication because he cannot tolerate it well. He reports that his pain radiates to his buttock.  His wife reports that his pain worsens when sitting and standing for long periods.  He denies having any fever, ear pain, congestion, sinus pain, sore throat, eye pain, chest pain, palpations, cough, SOB, wheezing, n/v/d, constipation, blood in stool, dysuria, frequency, hematuria, or headaches at this time.  Pt will see ortho today for his back.   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   Mild, related to stroke history   Nocturia    Renal insufficiency 06/25/2013   S/P carotid endarterectomy    BILATERAL ICA--  PATENT PER DUPLEX  05-19-2012   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 2016   cataract surgery both eyes   INGUINAL HERNIA REPAIR Right 11-08-2006   MASS EXCISION N/A 03/03/2016   Procedure: EXCISION OF BACK  MASS;  Surgeon: Stark Klein, MD;  Location: [place];  Service: General;  Laterality: N/A;   MOHS SURGERY Left 1/ 2016   Dr Nevada Crane-- Basal cell   PROSTATE SURGERY     TRANSURETHRAL RESECTION OF PROSTATE N/A 02/26/2013  Procedure: TRANSURETHRAL RESECTION OF THE PROSTATE WITH GYRUS INSTRUMENTS;  Surgeon: Franchot Gallo, MD;  Location: [facility];  Service: Urology;  Laterality: N/A;    Family History  Problem Relation Age of Onset   Heart disease Mother        CHF   Bipolar disorder Mother    Heart disease Father        CHF    Social History   Socioeconomic History   Marital status: Married    Spouse name: Not on file   Number of children: 2   Years of education: 12   Highest education level: High school graduate  Occupational History    Employer: Retired  Tobacco Use   Smoking status: Former    Packs/day: 2.00    Years: 40.00    Pack years: 80.00    Types:  Cigarettes    Quit date: 02/15/2005    Years since quitting: 15.9   Smokeless tobacco: Never  Vaping Use   Vaping Use: Never used  Substance and Sexual Activity   Alcohol use: Yes    Alcohol/week: 0.0 standard drinks    Comment: Occasional   Drug use: No   Sexual activity: Yes    Partners: Female  Other Topics Concern   Not on file  Social History Narrative   Exercise--  Walks dogs everyday      LIves with wife , no stairs in home, caffeine - one cup coffee day, exercise - not much, Right handed, 12th grade, retired      One story home   Social Determinants of Health   Financial Resource Strain: Not on file  Food Insecurity: Not on file  Transportation Needs: Not on file  Physical Activity: Not on file  Stress: Not on file  Social Connections: Not on file  Intimate Partner Violence: Not on file    Outpatient Medications Prior to Visit  Medication Sig Dispense Refill   acetaminophen (TYLENOL) 500 MG tablet Take 500 mg by mouth every 6 (six) hours as needed (1 tab as needed).     amLODipine (NORVASC) 10 MG tablet TAKE 1 TABLET BY MOUTH DAILY 90 tablet 3   amLODipine (NORVASC) 10 MG tablet Take 1 tablet by mouth daily 90 tablet 3   amLODipine (NORVASC) 5 MG tablet TAKE 1 TABLET (5 MG TOTAL) BY MOUTH DAILY. 90 tablet 1   aspirin 325 MG EC tablet Take 325 mg by mouth daily.     carbamazepine (TEGRETOL XR) 100 MG 12 hr tablet TAKE 1 TABLET BY MOUTH EVERY MORNING AND 2 AT BEDTIME 270 tablet 1   Cholecalciferol (VITAMIN D) 2000 UNITS CAPS Take by mouth.     COVID-19 mRNA Vac-TriS, Pfizer, (PFIZER-BIONT COVID-19 VAC-TRIS) SUSP injection Inject into the muscle. 0.3 mL 1   docusate sodium (COLACE) 100 MG capsule Take 100 mg by mouth daily.     Eszopiclone 3 MG TABS TAKE 1 TABLET BY MOUTH IMMEDIATELY BEFORE BEDTIME AS NEEDED 90 tablet 2   famotidine (PEPCID) 20 MG tablet Take 20 mg by mouth 2 (two) times daily.     fenofibrate 160 MG tablet Take by mouth daily. 90 tablet 0    Flaxseed, Linseed, (FLAX SEED OIL PO) Take by mouth daily.     fluticasone (CUTIVATE) 0.05 % cream   3   influenza vaccine adjuvanted (FLUAD) 0.5 ML injection TAKE AS DIRECTED .5 mL 0   lisinopril-hydrochlorothiazide (ZESTORETIC) 10-12.5 MG tablet Take 1 tablet by mouth daily.  lisinopril-hydrochlorothiazide (ZESTORETIC) 10-12.5 MG tablet TAKE 1 TABLET BY MOUTH DAILY 30 tablet 5   lisinopril-hydrochlorothiazide (ZESTORETIC) 10-12.5 MG tablet Take 1 tablet by mouth daily 30 tablet 5   LORazepam (ATIVAN) 0.5 MG tablet TAKE 2 TABLETS BY MOUTH TWICE A DAY MAY TAKE 1 ADDITIONAL AS NEEDED 150 tablet 5   Multiple Vitamin (MULTIVITAMIN) tablet Take 1 tablet by mouth daily.     NONFORMULARY OR COMPOUNDED ITEM [facility]:  Antifungal topical - Terbinafine 3%, Fluconazole 2%, Tea Tree Oil 5%, Urea 10/%,, Ibuprofen 2% in DMSO Suspension #57ml. Apply to the affected nail(s) once (at bedtime) or twice daily. 100 each 2   ondansetron (ZOFRAN ODT) 4 MG disintegrating tablet Take 1 tablet (4 mg total) by mouth every 8 (eight) hours as needed for nausea or vomiting. 20 tablet 0   pantoprazole (PROTONIX) 40 MG tablet TAKE 1 TABLET (40 MG TOTAL) BY MOUTH 2 (TWO) TIMES DAILY BEFORE A MEAL. 60 tablet 2   sildenafil (VIAGRA) 100 MG tablet Take 1 tablet (100 mg total) by mouth as needed up to once daily. 10 tablet 11   tamsulosin (FLOMAX) 0.4 MG CAPS capsule TAKE 1 CAPSULE BY MOUTH ONCE DAILY 90 capsule 3   tiZANidine (ZANAFLEX) 4 MG tablet Take 1 tablet (4 mg total) by mouth every 6 (six) hours as needed for muscle spasms. 30 tablet 0   traMADol (ULTRAM) 50 MG tablet Take 1 - 2 tablets by mouth every 6 hours as needed for pain 40 tablet 0   triamcinolone cream (KENALOG) 0.1 % Apply 1 application topically 2 (two) times daily. 30 g 0   diclofenac Sodium (VOLTAREN) 1 % GEL Apply 4 g topically 4 (four) times daily. 100 g 0   lidocaine (LIDODERM) 5 % Place 1 patch onto the skin daily. Remove & Discard patch within  12 hours or as directed by MD 30 patch 0   Pitavastatin Calcium 1 MG TABS TAKE ONE TABLET BY MOUTH ONCE DAILY 30 tablet 2   No facility-administered medications prior to visit.    Allergies  Allergen Reactions   Other Anaphylaxis   Strawberry Extract Hives   Adhesive [Tape] Other (See Comments)    BLISTER   Latex     Reaction to Plastic Tape   Statins Other (See Comments)    myalgias   Strawberry (Diagnostic) Hives   Zetia [Ezetimibe] Other (See Comments)    Intolerance     Review of Systems  Constitutional:  Negative for fever.  HENT:  Negative for congestion, ear pain, sinus pain and sore throat.   Eyes:  Negative for pain.  Respiratory:  Negative for cough, shortness of breath and wheezing.   Cardiovascular:  Negative for chest pain and palpitations.  Gastrointestinal:  Negative for blood in stool, constipation, diarrhea, nausea and vomiting.  Genitourinary:  Negative for dysuria, frequency and hematuria.  Neurological:  Negative for headaches.  Psychiatric/Behavioral:  Negative for depression. The patient is not nervous/anxious.       Objective:    Physical Exam Constitutional:      General: He is not in acute distress.    Appearance: Normal appearance. He is not ill-appearing.  HENT:     Head: Normocephalic and atraumatic.     Right Ear: Tympanic membrane, ear canal and external ear normal.     Left Ear: Tympanic membrane, ear canal and external ear normal.  Eyes:     Extraocular Movements: Extraocular movements intact.     Pupils: Pupils are equal, round, and  reactive to light.  Cardiovascular:     Rate and Rhythm: Normal rate and regular rhythm.     Heart sounds: Normal heart sounds. No murmur heard.   No gallop.  Pulmonary:     Effort: Pulmonary effort is normal. No respiratory distress.     Breath sounds: Normal breath sounds. No wheezing or rales.  Abdominal:     General: Bowel sounds are normal. There is no distension.     Palpations: Abdomen is  soft.     Tenderness: There is no abdominal tenderness. There is no guarding.  Musculoskeletal:     Comments: 5/5 strength in lower extremities with pain  Skin:    General: Skin is warm and dry.  Neurological:     Mental Status: He is alert and oriented to person, place, and time.  Psychiatric:        Behavior: Behavior normal.    BP 122/84 (BP Location: Left Arm, Patient Position: Sitting, Cuff Size: Normal)   Pulse 86   Temp 98.7 F (37.1 C) (Oral)   Resp 18   Ht 5' 10.5" (1.791 m)   Wt 207 lb 9.6 oz (94.2 kg)   SpO2 97%   BMI 29.37 kg/m  Wt Readings from Last 3 Encounters:  02/03/21 207 lb 9.6 oz (94.2 kg)  01/13/21 211 lb (95.7 kg)  11/18/20 213 lb 9.6 oz (96.9 kg)    Diabetic Foot Exam - Simple   No data filed    Lab Results  Component Value Date   WBC 7.2 01/29/2020   HGB 12.6 (L) 01/29/2020   HCT 37.6 (L) 01/29/2020   PLT 262.0 01/29/2020   GLUCOSE 101 (H) 07/31/2020   CHOL 208 (H) 07/31/2020   TRIG 88.0 07/31/2020   HDL 56.70 07/31/2020   LDLCALC 133 (H) 07/31/2020   ALT 14 07/31/2020   AST 17 07/31/2020   NA 141 07/31/2020   K 4.3 07/31/2020   CL 106 07/31/2020   CREATININE 2.10 (H) 07/31/2020   BUN 36 (H) 07/31/2020   CO2 27 07/31/2020   TSH 1.220 04/25/2019   PSA 2.10 12/24/2016   INR 1.0 11/08/2008   HGBA1C 5.8 (H) 09/17/2019   MICROALBUR 7.4 (H) 07/31/2020    Lab Results  Component Value Date   TSH 1.220 04/25/2019   Lab Results  Component Value Date   WBC 7.2 01/29/2020   HGB 12.6 (L) 01/29/2020   HCT 37.6 (L) 01/29/2020   MCV 94.3 01/29/2020   PLT 262.0 01/29/2020   Lab Results  Component Value Date   NA 141 07/31/2020   K 4.3 07/31/2020   CO2 27 07/31/2020   GLUCOSE 101 (H) 07/31/2020   BUN 36 (H) 07/31/2020   CREATININE 2.10 (H) 07/31/2020   BILITOT 0.4 07/31/2020   ALKPHOS 54 07/31/2020   AST 17 07/31/2020   ALT 14 07/31/2020   PROT 7.3 07/31/2020   ALBUMIN 4.1 07/31/2020   CALCIUM 9.5 07/31/2020   ANIONGAP 8  03/01/2016   GFR 29.70 (L) 07/31/2020   Lab Results  Component Value Date   CHOL 208 (H) 07/31/2020   Lab Results  Component Value Date   HDL 56.70 07/31/2020   Lab Results  Component Value Date   LDLCALC 133 (H) 07/31/2020   Lab Results  Component Value Date   TRIG 88.0 07/31/2020   Lab Results  Component Value Date   CHOLHDL 4 07/31/2020   Lab Results  Component Value Date   HGBA1C 5.8 (H) 09/17/2019   Colonoscopy-  Last completed 02/27/2015. Results showed two 3-4 mm polyps in transverse colon and cecum which were removed, diverticulosis in the sigmoid colon, otherwise results are normal. Repeat in 5 years. He is willing to make an appointment after his L1 fracture is treated.  Dexa- Last completed 07/31/2020. Results showed he is osteopenic. Repeat in 2 years.       Assessment & Plan:   Problem List Items Addressed This Visit       Unprioritized   BPH (benign prostatic hyperplasia)    Per urology      Carotid artery stenosis, asymptomatic, bilateral    con't meds F/u carotid doppler       Closed fracture of first lumbar vertebra with routine healing    Per ortho       HTN (hypertension)    Well controlled, no changes to meds. Encouraged heart healthy diet such as the DASH diet and exercise as tolerated.       Relevant Orders   Lipid panel   CBC with Differential/Platelet   Comprehensive metabolic panel   Hyperlipidemia    Encourage heart healthy diet such as MIND or DASH diet, increase exercise, avoid trans fats, simple carbohydrates and processed foods, consider a krill or fish or flaxseed oil cap daily.       Relevant Orders   Lipid panel   CBC with Differential/Platelet   Comprehensive metabolic panel   Major vascular neurocognitive disorder    Stable       Preventative health care - Primary    ghm utd Check labs  See avs       Renal insufficiency    Per nephrology      Other Visit Diagnoses     High risk medication use        Relevant Orders   Carbamazepine Level (Tegretol), total        No orders of the defined types were placed in this encounter.   I, Dr. Roma Schanz, DO, personally preformed the services described in this documentation.  All medical record entries made by the scribe were at my direction and in my presence.  I have reviewed the chart and discharge instructions (if applicable) and agree that the record reflects my personal performance and is accurate and complete. 02/03/2021   I,[name],acting as a scribe for Ann Held, DO.,have documented all relevant documentation on the behalf of Ann Held, DO,as directed by  Ann Held, DO while in the presence of Ann Held, DO.   Ann Held, DO

## 2021-02-03 NOTE — Assessment & Plan Note (Signed)
Per nephrology 

## 2021-02-04 ENCOUNTER — Ambulatory Visit: Payer: PPO | Admitting: Physical Therapy

## 2021-02-04 ENCOUNTER — Other Ambulatory Visit: Payer: Self-pay | Admitting: Chiropractic Medicine

## 2021-02-04 DIAGNOSIS — S32000A Wedge compression fracture of unspecified lumbar vertebra, initial encounter for closed fracture: Secondary | ICD-10-CM

## 2021-02-04 LAB — CARBAMAZEPINE LEVEL, TOTAL: Carbamazepine Lvl: 5.4 mg/L (ref 4.0–12.0)

## 2021-02-06 ENCOUNTER — Ambulatory Visit
Admission: RE | Admit: 2021-02-06 | Discharge: 2021-02-06 | Disposition: A | Discharge status: Home / Self Care | Payer: PPO | Source: Ambulatory Visit | Attending: Chiropractic Medicine | Admitting: Chiropractic Medicine

## 2021-02-06 DIAGNOSIS — S32010A Wedge compression fracture of first lumbar vertebra, initial encounter for closed fracture: Secondary | ICD-10-CM | POA: Diagnosis not present

## 2021-02-06 DIAGNOSIS — S32000A Wedge compression fracture of unspecified lumbar vertebra, initial encounter for closed fracture: Secondary | ICD-10-CM

## 2021-02-06 NOTE — Consult Note (Signed)
Chief Complaint: Patient was seen in consultation today for low back pain at the request of Knowles,Carol  Referring Physician(s): Knowles,Carol  History of Present Illness: William [name] is a 80 y.o. male history of multiple falls over the past 2 months, the worst from his right.  Subsequently patient had low back and rib pain.  The pain is decreased when supine or sitting, increased when up and about.  Previously, the patient was able to ambulate without assistance.  Since the injury, he is unable to walk significant distances.  He is having a left buttock as well as low back pain. He is used tramadol for pain relief but cannot tolerate this because of severe constipation.  He is currently using Tylenol and lidocaine patches per prescribed directions with incomplete relief of symptoms.  He rates his pain 8 of 10 on the visual analog pain scale at its worst when he is up and about.  He scores 22 on the [employer] disability questionnaire.  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   Mild, related to stroke history   Nocturia    Renal insufficiency 06/25/2013   S/P carotid endarterectomy    BILATERAL ICA--  PATENT PER DUPLEX  05-19-2012   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   Right calf   Urinary frequency    Vitamin D deficiency     Past Surgical History:   Procedure Laterality Date   APPENDECTOMY  AS CHILD   CARDIOVASCULAR STRESS TEST  03-27-2012  DR CRENSHAW   LOW RISK LEXISCAN STUDY-- PROBABLE NORMAL PERFUSION AND SOFT TISSUE ATTENUATION/  NO ISCHEMIA/ EF 51%   CAROTID ENDARTERECTOMY Bilateral LEFT  11-12-2008  DR GREG HAYES   RIGHT ICA  2006  ([facility])   CYSTOSCOPY WITH LITHOLAPAXY N/A 02/26/2013   Procedure: CYSTOSCOPY WITH LITHOLAPAXY;  Surgeon: Franchot Gallo, MD;  Location: [facility];  Service: Urology;  Laterality: N/A;   EYE SURGERY  Jan. 2016   cataract surgery both eyes   INGUINAL HERNIA REPAIR Right 11-08-2006   MASS EXCISION N/A 03/03/2016   Procedure: EXCISION OF BACK  MASS;  Surgeon: Stark Klein, MD;  Location: [place];  Service: General;  Laterality: N/A;   MOHS SURGERY Left 1/ 2016   Dr Nevada Crane-- Basal cell   PROSTATE SURGERY     TRANSURETHRAL RESECTION OF PROSTATE N/A 02/26/2013   Procedure: TRANSURETHRAL RESECTION OF THE PROSTATE WITH GYRUS INSTRUMENTS;  Surgeon: Franchot Gallo, MD;  Location: [facility];  Service: Urology;  Laterality: N/A;    Allergies: Strawberry extract, Adhesive [tape], Latex, Statins, Strawberry (diagnostic), and Zetia [ezetimibe]  Medications: Prior to Admission medications   Medication Sig Start Date End Date Taking? Authorizing Provider  acetaminophen (TYLENOL) 500 MG tablet Take 500 mg by  mouth every 6 (six) hours as needed (1 tab as needed).   Yes [provider]  amLODipine (NORVASC) 10 MG tablet TAKE 1 TABLET BY MOUTH DAILY 05/14/20 05/14/21 Yes Rosita Fire, MD  aspirin 325 MG EC tablet Take 325 mg by mouth daily.   Yes [provider]  carbamazepine (TEGRETOL XR) 100 MG 12 hr tablet TAKE 1 TABLET BY MOUTH EVERY MORNING AND 2 AT BEDTIME 01/28/21 01/28/22 Yes Plovsky, Berneta Sages, MD  Cholecalciferol (VITAMIN D) 2000 UNITS CAPS Take by mouth.   Yes [provider]  docusate sodium (COLACE) 100 MG capsule Take  100 mg by mouth daily.   Yes [provider]  Eszopiclone 3 MG TABS TAKE 1 TABLET BY MOUTH IMMEDIATELY BEFORE BEDTIME AS NEEDED 01/28/21 07/27/21 Yes Plovsky, Berneta Sages, MD  famotidine (PEPCID) 20 MG tablet Take 20 mg by mouth 2 (two) times daily.   Yes [provider]  fenofibrate 160 MG tablet Take by mouth daily. 12/11/20 12/11/21 Yes Lowne Chase, Kendrick Fries R, DO  Flaxseed, Linseed, (FLAX SEED OIL PO) Take by mouth daily.   Yes [provider]  fluticasone (CUTIVATE) 0.05 % cream  12/16/16  Yes [provider]  lisinopril-hydrochlorothiazide (ZESTORETIC) 10-12.5 MG tablet Take 1 tablet by mouth daily. 10/27/18  Yes [provider]  LORazepam (ATIVAN) 0.5 MG tablet TAKE 2 TABLETS BY MOUTH TWICE A DAY MAY TAKE 1 ADDITIONAL AS NEEDED 01/28/21 07/27/21 Yes Plovsky, Berneta Sages, MD  Multiple Vitamin (MULTIVITAMIN) tablet Take 1 tablet by mouth daily.   Yes [provider]  ondansetron (ZOFRAN ODT) 4 MG disintegrating tablet Take 1 tablet (4 mg total) by mouth every 8 (eight) hours as needed for nausea or vomiting. 09/11/19  Yes Carollee Herter, Yvonne R, DO  pantoprazole (PROTONIX) 40 MG tablet TAKE 1 TABLET (40 MG TOTAL) BY MOUTH 2 (TWO) TIMES DAILY BEFORE A MEAL. 08/25/20 08/25/21 Yes Vevelyn Francois, NP  sildenafil (VIAGRA) 100 MG tablet Take 1 tablet (100 mg total) by mouth as needed up to once daily. 10/22/20  Yes Dahlstedt, Annie Main, MD  tamsulosin (FLOMAX) 0.4 MG CAPS capsule TAKE 1 CAPSULE BY MOUTH ONCE DAILY 01/22/21 01/22/22 Yes Lowne Lyndal Pulley R, DO  tiZANidine (ZANAFLEX) 4 MG tablet Take 1 tablet (4 mg total) by mouth every 6 (six) hours as needed for muscle spasms. 01/13/21  Yes Ann Held, DO  traMADol (ULTRAM) 50 MG tablet Take 1 - 2 tablets by mouth every 6 hours as needed for pain 01/13/21  Yes Roma Schanz R, DO  triamcinolone cream (KENALOG) 0.1 % Apply 1 application topically 2 (two) times daily. 12/31/20  Yes Roma Schanz R, DO   amLODipine (NORVASC) 10 MG tablet Take 1 tablet by mouth daily 10/08/20     amLODipine (NORVASC) 5 MG tablet TAKE 1 TABLET (5 MG TOTAL) BY MOUTH DAILY. 02/05/20 02/04/21  Ann Held, DO  COVID-19 mRNA Vac-TriS, Pfizer, (PFIZER-BIONT COVID-19 VAC-TRIS) SUSP injection Inject into the muscle. 09/23/20   Carlyle Basques, MD  influenza vaccine adjuvanted (FLUAD) 0.5 ML injection TAKE AS DIRECTED 03/17/20 03/17/21  Carlyle Basques, MD  lisinopril-hydrochlorothiazide (ZESTORETIC) 10-12.5 MG tablet TAKE 1 TABLET BY MOUTH DAILY 04/08/20 04/08/21  Rosita Fire, MD  lisinopril-hydrochlorothiazide (ZESTORETIC) 10-12.5 MG tablet Take 1 tablet by mouth daily 10/08/20     NONFORMULARY OR COMPOUNDED ITEM [facility]:  Antifungal topical - Terbinafine 3%, Fluconazole 2%, Tea Tree Oil 5%, Urea 10/%,, Ibuprofen 2% in DMSO Suspension #21ml. Apply to the affected  nail(s) once (at bedtime) or twice daily. 10/09/18   Trula Slade, DPM     Family History  Problem Relation Age of Onset   Heart disease Mother        CHF   Bipolar disorder Mother    Heart disease Father        CHF    Social History   Socioeconomic History   Marital status: Married    Spouse name: Not on file   Number of children: 2   Years of education: 12   Highest education level: High school graduate  Occupational History    Employer: Retired  Tobacco Use   Smoking status: Former    Packs/day: 2.00    Years: 40.00    Pack years: 80.00    Types: Cigarettes    Quit date: 02/15/2005    Years since quitting: 15.9   Smokeless tobacco: Never  Vaping Use   Vaping Use: Never used  Substance and Sexual Activity   Alcohol use: Yes    Alcohol/week: 0.0 standard drinks    Comment: Occasional   Drug use: No   Sexual activity: Yes    Partners: Female  Other Topics Concern   Not on file  Social History Narrative   Exercise--  Walks dogs everyday      LIves with wife , no stairs in home, caffeine - one cup coffee  day, exercise - not much, Right handed, 12th grade, retired      One story home   Social Determinants of Health   Financial Resource Strain: Not on file  Food Insecurity: Not on file  Transportation Needs: Not on file  Physical Activity: Not on file  Stress: Not on file  Social Connections: Not on file    ECOG Status: 2 - Symptomatic, <50% confined to bed  Review of Systems: A 12 point ROS discussed and pertinent positives are indicated in the HPI above.  All other systems are negative.  Review of Systems Positive for indigestion/reflux/heartburn, memory problems, concentration problems, facial weakness Vital Signs: BP (!) 156/63 (BP Location: Right Arm, Patient Position: Sitting, Cuff Size: Normal)   Pulse 66   Temp 98.2 F (36.8 C) (Oral)   SpO2 97% Comment: room air  Physical Exam  Constitutional: Oriented to person, place, and time. Well-developed and well-nourished. No distress.   HENT:  Head: Normocephalic and atraumatic.  Eyes: Conjunctivae and EOM are normal. Right eye exhibits no discharge. Left eye exhibits no discharge. No scleral icterus.  Neck: No JVD present.  Pulmonary/Chest: Effort normal. No stridor. No respiratory distress.  Tenderness midline lower costal margin. Abdomen: soft, non distended Neurological:  alert and oriented to person, place, and time.  Skin: Skin is warm and dry.  not diaphoretic.  Psychiatric:   normal mood and affect.   behavior is normal. Judgment and thought content normal.       Imaging: DG Thoracic Spine 2 View  Result Date: 01/15/2021 CLINICAL DATA:  Mid back pain.  Frequent falls. EXAM: THORACIC SPINE 2 VIEWS COMPARISON:  Thoracic spine radiograph 06/26/2018 FINDINGS: Stable alignment with slightly exaggerated thoracic kyphosis. No acute compression deformity or fracture. Stable multilevel degenerative change with disc space narrowing and anterior osteophytes and syndesmophytes. No evidence of focal bone lesion or bone  destruction. There is no paravertebral soft tissue abnormality. The bones appear under mineralized. IMPRESSION: 1. No acute fracture of the thoracic spine. 2. Stable multilevel degenerative change since January 2020. Electronically Signed   By: Keith Rake  M.D.   On: 01/15/2021 11:50   DG Lumbar Spine Complete  Result Date: 01/15/2021 CLINICAL DATA:  Frequent falls and mid back pain. EXAM: LUMBAR SPINE - COMPLETE 4+ VIEW COMPARISON:  Lumbar spine radiograph dated 07/31/2010. FINDINGS: There is no acute fracture or subluxation of the lumbar spine. The bones are osteopenic. There multilevel degenerative changes with endplate irregularity and anterior osteophyte/spurring. The visualized posterior elements are intact. Lower lumbar facet arthropathy. There is atherosclerotic calcification of the abdominal aorta. The soft tissues are unremarkable. IMPRESSION: 1. No acute/traumatic lumbar spine pathology. 2. Multilevel degenerative changes, significantly progressed since the study of 2012. Electronically Signed   By: Anner Crete M.D.   On: 01/15/2021 03:45   MR Lumbar Spine Wo Contrast  Result Date: 01/31/2021 CLINICAL DATA:  Low back pain and progressive neurologic deficit after falling 2 months ago. No history of cancer or previous relevant surgery. EXAM: MRI LUMBAR SPINE WITHOUT CONTRAST TECHNIQUE: Multiplanar, multisequence MR imaging of the lumbar spine was performed. No intravenous contrast was administered. COMPARISON:  Radiographs 01/13/2021.  MRI 11/14/2019. FINDINGS: Segmentation: Conventional anatomy assumed, with the last open disc space designated L5-S1.Concordant with previous imaging. Alignment:  Physiologic. Vertebrae: There is a nondisplaced transverse fracture through the L1 vertebral body with diffuse surrounding marrow edema. Edema extends into the right L1 pedicle. There is no significant loss of vertebral body height or osseous retropulsion. No other fractures or suspicious osseous  lesions are seen. The visualized sacroiliac joints appear unremarkable. Conus medullaris: Extends to the T12-L1 level and appears normal. Paraspinal and other soft tissues: No significant paraspinal hematoma. Grossly stable parapelvic and cortical renal cysts bilaterally. Disc levels: T12-L1: Disc height and hydration are maintained. Mild anterior and left-sided paraspinal osteophyte formation. No spinal stenosis or nerve root encroachment. L1-2: Disc height and hydration are maintained. Mild disc bulging, paraspinal osteophyte formation and facet hypertrophy. No spinal stenosis or nerve root encroachment. L2-3: Mild disc bulging, facet and ligamentous hypertrophy. Preserved disc height. No significant spinal stenosis or nerve root encroachment. L3-4: Loss of disc height with annular disc bulging and endplate osteophytes asymmetric to the right. Mild facet and ligamentous hypertrophy. Stable mild right lateral recess and foraminal narrowing. L4-5: Stable mild loss of disc height with annular disc bulging and endplate osteophytes asymmetric to the right. Mild bilateral facet hypertrophy. No significant spinal stenosis or nerve root encroachment. L5-S1: Preserved disc height and hydration. Mild bilateral facet hypertrophy. No significant spinal stenosis or nerve root encroachment. IMPRESSION: 1. Nondisplaced acute/subacute L1 fracture. No significant loss of vertebral body height or osseous retropulsion. 2. Stable relatively mild multilevel spondylosis, greatest at L3-4. No high-grade spinal stenosis or definite nerve root encroachment. Electronically Signed   By: Richardean Sale M.D.   On: 01/31/2021 12:26    Labs:  CBC: Recent Labs    02/03/21 0943  WBC 7.3  HGB 11.9*  HCT 35.6*  PLT 282.0    COAGS: No results for input(s): INR, APTT in the last 8760 hours.  BMP: Recent Labs    07/31/20 0929 02/03/21 0943  NA 141 140  K 4.3 4.0  CL 106 105  CO2 27 25  GLUCOSE 101* 92  BUN 36* 32*  CALCIUM  9.5 9.4  CREATININE 2.10* 1.92*    LIVER FUNCTION TESTS: Recent Labs    07/31/20 0929 02/03/21 0943  BILITOT 0.4 0.5  AST 17 16  ALT 14 11  ALKPHOS 54 127*  PROT 7.3 7.3  ALBUMIN 4.1 4.1    TUMOR MARKERS: No  results for input(s): AFPTM, CEA, CA199, CHROMGRNA in the last 8760 hours.  Assessment and Plan:  My impression is that this patient has subacute L1 compression fracture deformity which likely contributes or account for most of the low back pain.  Based on cross-sectional imaging, this would be anatomically approachable for percutaneous intervention.  No associated spinal stenosis or other contraindications.  No suggestion of metastatic disease or other pathologic findings to indicate a need for concomitant core biopsy. Given the  lack of adequate symptom relief with time and an appropriate attempted narcotic-based pain medication regimen, and   limitations of activities of daily living, the patient  is clinically an appropriate candidate for consideration of vertebral augmentation. I discussed with the patient and spouse the pathophysiology of vertebral compression fracture deformities; the stable nature of these which does not require emergent treatment; natural history which includes healing over some unpredictable number of months.  We discussed treatment options including watchful waiting, surgical fixation, and percutaneous kyphoplasty/vertebroplasty.  We discussed in detail the percutaneous kyphoplasty technique, anticipated benefits, time course to symptom resolution, possible risks and side effects.  We discussed his elevated risk of additional level fractures with or without vertebral augmentation.  We discussed the potential long-term need for continued bone building therapy managed by the patient's PCP.   They seemed to understand, and did ask appropriate questions. The patient is motivated to proceed with treatment ASAP.  Accordingly, we schedule percutaneous lumbar L1  kyphoplasty under moderate sedation at [facility] imaging: [facility]   as an outpatient at the patient's convenience, pending carrier approval if needed.   Thank you for this interesting consult.  I greatly enjoyed meeting William [name] and look forward to participating in their care.  A copy of this report was sent to the requesting provider on this date.  Electronically Signed: Rickard Rhymes 02/06/2021, 1:02 PM   I spent a total of 40 Minutes   in face to face in clinical consultation, greater than 50% of which was counseling/coordinating care for painful subacute L1 compression fracture.

## 2021-02-09 ENCOUNTER — Ambulatory Visit: Payer: PPO | Admitting: Physical Therapy

## 2021-02-09 ENCOUNTER — Other Ambulatory Visit: Payer: Self-pay | Admitting: Family Medicine

## 2021-02-09 DIAGNOSIS — R748 Abnormal levels of other serum enzymes: Secondary | ICD-10-CM

## 2021-02-09 DIAGNOSIS — D649 Anemia, unspecified: Secondary | ICD-10-CM

## 2021-02-10 ENCOUNTER — Ambulatory Visit: Payer: PPO | Admitting: Neurology

## 2021-02-11 ENCOUNTER — Inpatient Hospital Stay: Admission: RE | Admit: 2021-02-11 | Payer: PPO | Source: Ambulatory Visit

## 2021-02-11 ENCOUNTER — Other Ambulatory Visit: Payer: PPO

## 2021-02-11 ENCOUNTER — Ambulatory Visit: Payer: PPO | Admitting: Physical Therapy

## 2021-02-12 ENCOUNTER — Telehealth: Payer: Self-pay | Admitting: *Deleted

## 2021-02-12 ENCOUNTER — Ambulatory Visit
Admission: RE | Admit: 2021-02-12 | Discharge: 2021-02-12 | Disposition: A | Discharge status: Home / Self Care | Payer: PPO | Source: Ambulatory Visit | Attending: Chiropractic Medicine | Admitting: Chiropractic Medicine

## 2021-02-12 ENCOUNTER — Other Ambulatory Visit: Payer: Self-pay

## 2021-02-12 DIAGNOSIS — M4856XA Collapsed vertebra, not elsewhere classified, lumbar region, initial encounter for fracture: Secondary | ICD-10-CM | POA: Diagnosis not present

## 2021-02-12 DIAGNOSIS — S32000A Wedge compression fracture of unspecified lumbar vertebra, initial encounter for closed fracture: Secondary | ICD-10-CM

## 2021-02-12 HISTORY — PX: IR KYPHO EA ADDL LEVEL THORACIC OR LUMBAR: IMG5520

## 2021-02-12 MED ORDER — IOPAMIDOL (ISOVUE-M 200) INJECTION 41%
1.0000 mL | Freq: Once | INTRAMUSCULAR | Status: AC
  Administered 2021-02-12: 1 mL

## 2021-02-12 MED ORDER — MIDAZOLAM HCL 2 MG/2ML IJ SOLN
1.0000 mg | INTRAMUSCULAR | Status: DC | PRN
  Administered 2021-02-12 (×4): 1 mg via INTRAVENOUS

## 2021-02-12 MED ORDER — CEFAZOLIN SODIUM-DEXTROSE 2-4 GM/100ML-% IV SOLN
2.0000 g | INTRAVENOUS | Status: AC
  Administered 2021-02-12: 2 g via INTRAVENOUS

## 2021-02-12 MED ORDER — FENTANYL CITRATE PF 50 MCG/ML IJ SOSY
25.0000 ug | PREFILLED_SYRINGE | INTRAMUSCULAR | Status: DC | PRN
  Administered 2021-02-12: 50 ug via INTRAVENOUS
  Administered 2021-02-12: 25 ug via INTRAVENOUS
  Administered 2021-02-12 (×2): 50 ug via INTRAVENOUS
  Administered 2021-02-12: 25 ug via INTRAVENOUS

## 2021-02-12 MED ORDER — KETOROLAC TROMETHAMINE 30 MG/ML IJ SOLN
30.0000 mg | Freq: Once | INTRAMUSCULAR | Status: AC
  Administered 2021-02-12: 30 mg via INTRAVENOUS

## 2021-02-12 MED ORDER — SODIUM CHLORIDE 0.9 % IV SOLN: INTRAVENOUS | Status: DC

## 2021-02-12 NOTE — Telephone Encounter (Signed)
Called [name] at [facility] and made aware to schedule Mr. William [name] for follow up on 02/24/2021 with Dr. Laurence Ferrari. Alicia from [facility] will call pt on 9/9 to access if doing fine will only be a phone follow up. [name] aware and understands.Cathren Harsh

## 2021-02-12 NOTE — Discharge Instructions (Signed)
Kyphoplasty Post Procedure Discharge Instructions ° °May resume a regular diet and any medications that you routinely take (including pain medications). However, if you are taking Aspirin or an anticoagulant/blood thinner you will be told when you can resume taking these by the healthcare provider. °No driving day of procedure. °The day of your procedure take it easy. You may use an ice pack as needed to injection sites on back.  Ice to back 30 minutes on and 30 minutes off, as needed. °May remove bandaids tomorrow after taking a shower. Replace daily with a clean bandaid until healed.  °Do not lift anything heavier than a milk jug for 1-2 weeks or determined by your physician.  °Follow up with your physician in 2 weeks. ° ° ° °Please contact our office at [phone] for the following symptoms or if you have any questions: ° °Fever greater than 100 degrees °Increased swelling, pain, or redness at injection site. °Increased back and/or leg pain °New numbness or change in symptoms from before the procedure.  ° ° °Thank you for visiting [facility].  ° °YOU MAY RESUME YOUR ASPIRIN ANYTIME AFTER PROCEDURE TODAY  °

## 2021-02-12 NOTE — Progress Notes (Signed)
Pt back in nursing recovery area. Pt still drowsy from procedure but will wake up when spoken to. Pt follows commands, talks in complete sentences and has no complaints at this time. Pt will be monitored until discharged by Radiologist.   

## 2021-02-17 ENCOUNTER — Ambulatory Visit: Payer: PPO | Admitting: Podiatry

## 2021-02-17 ENCOUNTER — Telehealth: Payer: Self-pay

## 2021-02-17 ENCOUNTER — Ambulatory Visit: Payer: PPO | Admitting: Physical Therapy

## 2021-02-17 ENCOUNTER — Ambulatory Visit (HOSPITAL_COMMUNITY)
Admission: RE | Admit: 2021-02-17 | Discharge: 2021-02-17 | Disposition: A | Discharge status: Home / Self Care | Payer: PPO | Source: Ambulatory Visit | Attending: Interventional Radiology | Admitting: Interventional Radiology

## 2021-02-17 ENCOUNTER — Other Ambulatory Visit: Payer: Self-pay | Admitting: Interventional Radiology

## 2021-02-17 DIAGNOSIS — M546 Pain in thoracic spine: Secondary | ICD-10-CM

## 2021-02-17 DIAGNOSIS — M5124 Other intervertebral disc displacement, thoracic region: Secondary | ICD-10-CM | POA: Diagnosis not present

## 2021-02-17 DIAGNOSIS — M40204 Unspecified kyphosis, thoracic region: Secondary | ICD-10-CM | POA: Diagnosis not present

## 2021-02-17 DIAGNOSIS — M2578 Osteophyte, vertebrae: Secondary | ICD-10-CM | POA: Diagnosis not present

## 2021-02-17 DIAGNOSIS — M47814 Spondylosis without myelopathy or radiculopathy, thoracic region: Secondary | ICD-10-CM | POA: Diagnosis not present

## 2021-02-17 NOTE — Telephone Encounter (Addendum)
Inbound call from pt reporting increased pain after Kypho procedure on 02/12/21. Pt reporting pain 9/10 while lying flat and 7/10 while sitting, pain has not improved, no relief noted. Notified MD Laurence Ferrari and repeat imaging ordered, pt scheduled to have MRI Thoracic Spine.

## 2021-02-18 ENCOUNTER — Other Ambulatory Visit: Payer: Self-pay | Admitting: Chiropractic Medicine

## 2021-02-18 ENCOUNTER — Ambulatory Visit: Payer: PPO | Admitting: Podiatrist

## 2021-02-18 ENCOUNTER — Other Ambulatory Visit: Payer: Self-pay

## 2021-02-18 ENCOUNTER — Other Ambulatory Visit: Payer: Self-pay | Admitting: Interventional Radiology

## 2021-02-18 ENCOUNTER — Ambulatory Visit
Admission: RE | Admit: 2021-02-18 | Discharge: 2021-02-18 | Disposition: A | Discharge status: Home / Self Care | Payer: PPO | Source: Ambulatory Visit | Attending: Interventional Radiology | Admitting: Interventional Radiology

## 2021-02-18 ENCOUNTER — Encounter: Payer: Self-pay | Admitting: Podiatrist

## 2021-02-18 DIAGNOSIS — M546 Pain in thoracic spine: Secondary | ICD-10-CM

## 2021-02-18 DIAGNOSIS — M79676 Pain in unspecified toe(s): Secondary | ICD-10-CM

## 2021-02-18 DIAGNOSIS — B351 Tinea unguium: Secondary | ICD-10-CM

## 2021-02-18 DIAGNOSIS — M545 Low back pain, unspecified: Secondary | ICD-10-CM

## 2021-02-18 DIAGNOSIS — S32000A Wedge compression fracture of unspecified lumbar vertebra, initial encounter for closed fracture: Secondary | ICD-10-CM

## 2021-02-18 DIAGNOSIS — Z9889 Other specified postprocedural states: Secondary | ICD-10-CM | POA: Diagnosis not present

## 2021-02-18 DIAGNOSIS — G8918 Other acute postprocedural pain: Secondary | ICD-10-CM | POA: Diagnosis not present

## 2021-02-18 HISTORY — PX: IR RADIOLOGIST EVAL & MGMT: IMG5224

## 2021-02-18 NOTE — Progress Notes (Signed)
Subjective: Patient is a pleasant 80 y.o. returns the office today with his wife for painful, elongated, thickened toenails which he cannot trim himself.  Denies any open lesions.  He has no other concerns today to his feet.  He recently had a back procedure done and relates he is having some difficulty with back pain- he is going to see his surgeon today.  Denies any systemic complaints such as fevers, chills, nausea, vomiting.    PCP: Carollee Herter, Alferd Apa, DO   Objective: NAD, presents with his wife DP/PT pulses palpable, CRT less than 3 seconds  Nails hypertrophic, dystrophic, elongated, brittle, discolored x 10. There is tenderness overlying the nails 1-5 bilaterally. There is no edema, erythema or drainage to the toenail sites today. Hammertoes present No open lesions or pre-ulcerative lesions are identified. No pain with calf compression, swelling, warmth, erythema.   Assessment: 1. Pain around toenail   2. Dermatophytosis of nail       Plan: -Treatment options including alternatives, risks, complications were discussed -Nails debrided x 10 without any complications or bleeding   Monitor feet for  signs or symptoms of infection.   Return in 61 days or sooner if needed

## 2021-02-19 ENCOUNTER — Encounter: Payer: PPO | Admitting: Physical Therapy

## 2021-02-20 ENCOUNTER — Telehealth: Payer: Self-pay

## 2021-02-20 NOTE — Telephone Encounter (Signed)
Phone call to pt to follow up from his kyphoplasty on 02/12/21. Pt was having pain and reported drainage at the site earlier this week and was brought in on 02/18/21 to see Dr. Laurence Ferrari. The patient reports he is feeling much better since his appointment. Pt reports "I have no pain at all now" and denies any drainage from the site. The patient had no complaints and has our direct number if he were to need [place].

## 2021-02-24 ENCOUNTER — Ambulatory Visit: Payer: PPO | Attending: Family Medicine | Admitting: Physical Therapy

## 2021-02-24 ENCOUNTER — Other Ambulatory Visit: Payer: Self-pay

## 2021-02-24 ENCOUNTER — Encounter: Payer: Self-pay | Admitting: Physical Therapy

## 2021-02-24 DIAGNOSIS — R296 Repeated falls: Secondary | ICD-10-CM | POA: Diagnosis not present

## 2021-02-24 DIAGNOSIS — M545 Low back pain, unspecified: Secondary | ICD-10-CM | POA: Diagnosis not present

## 2021-02-24 DIAGNOSIS — M6281 Muscle weakness (generalized): Secondary | ICD-10-CM | POA: Insufficient documentation

## 2021-02-24 DIAGNOSIS — R278 Other lack of coordination: Secondary | ICD-10-CM

## 2021-02-24 DIAGNOSIS — R2689 Other abnormalities of gait and mobility: Secondary | ICD-10-CM | POA: Diagnosis not present

## 2021-02-24 DIAGNOSIS — R262 Difficulty in walking, not elsewhere classified: Secondary | ICD-10-CM

## 2021-02-24 DIAGNOSIS — I69354 Hemiplegia and hemiparesis following cerebral infarction affecting left non-dominant side: Secondary | ICD-10-CM

## 2021-02-24 NOTE — Therapy (Signed)
[place]. [place], [name], [address] Phone: [phone]   Fax:  [phone]  Physical Therapy Evaluation  Patient Details  Name: William [name] MRN: [id number] Date of Birth: [birth year] Referring Provider (PT): Etter Sjogren   Encounter Date: 02/24/2021   PT End of Session - 02/24/21 1842     Visit Number 1    Date for PT Re-Evaluation 05/26/21    PT Start Time 0610    PT Stop Time 0700    PT Time Calculation (min) 50 min    Activity Tolerance Patient tolerated treatment well    Behavior During Therapy [facility] for tasks assessed/performed             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   Mild, related to stroke history   Nocturia    Renal insufficiency 06/25/2013   S/P carotid endarterectomy    BILATERAL ICA--  PATENT PER DUPLEX  05-19-2012   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   Right calf   Urinary frequency    Vitamin D deficiency     Past Surgical History:  Procedure Laterality Date   APPENDECTOMY  AS CHILD   CARDIOVASCULAR STRESS TEST  03-27-2012  DR CRENSHAW   LOW RISK LEXISCAN STUDY-- PROBABLE NORMAL PERFUSION AND SOFT TISSUE ATTENUATION/  NO ISCHEMIA/ EF 51%   CAROTID ENDARTERECTOMY Bilateral LEFT  11-12-2008  DR GREG HAYES   RIGHT ICA  2006  ([facility])   CYSTOSCOPY WITH LITHOLAPAXY  N/A 02/26/2013   Procedure: CYSTOSCOPY WITH LITHOLAPAXY;  Surgeon: Franchot Gallo, MD;  Location: [facility];  Service: Urology;  Laterality: N/A;   EYE SURGERY  Jan. 2016   cataract surgery both eyes   INGUINAL HERNIA REPAIR Right 11-08-2006   IR KYPHO EA ADDL LEVEL THORACIC OR LUMBAR  02/12/2021   IR RADIOLOGIST EVAL & MGMT  02/18/2021   MASS EXCISION N/A 03/03/2016   Procedure: EXCISION OF BACK  MASS;  Surgeon: Stark Klein, MD;  Location: [facility];  Service: General;  Laterality: N/A;   MOHS SURGERY Left 1/ 2016   Dr Nevada Crane-- Basal cell   PROSTATE SURGERY     TRANSURETHRAL RESECTION OF PROSTATE N/A 02/26/2013   Procedure: TRANSURETHRAL RESECTION OF THE PROSTATE WITH GYRUS INSTRUMENTS;  Surgeon: Franchot Gallo, MD;  Location: [facility];  Service: Urology;  Laterality: N/A;    There were no vitals filed for this visit.    Subjective Assessment - 02/24/21 1814     Subjective Patient has had about 4 falls in the past 6 months, he suffered rib fractures on the left.  He suffered a L1 compression fractures, Underwent a kyphoplasty on 02/12/21.  Reports that the kypholplasty helped the back pain, still has some rib  pain.    Limitations Standing;Walking;House hold activities    How long can you walk comfortably? 2 minutes    Patient Stated Goals have less pain, less falls, walk better.    Currently in Pain? Yes    Pain Score 2     Pain Location Back    Pain Orientation Lower    Pain Descriptors / Indicators Aching    Pain Type Acute pain    Pain Radiating Towards pain int he buttocks    Pain Onset More than a month ago    Pain Frequency Constant    Aggravating Factors  walking 2 minutes pain up to 6/10    Pain Relieving Factors tylenol, heat, rest, can get pain down to 0/10    Effect of Pain on Daily Activities limits everything                [facility] PT Assessment - 02/24/21 0001       Assessment   Medical Diagnosis LBP, rib  pain, falls, weakness,    Referring Provider (PT) [place]    Onset Date/Surgical Date 02/12/21    Prior Therapy for falls and back pain      Precautions   Precautions Fall      Balance Screen   Has the patient fallen in the past 6 months Yes    How many times? 4    Has the patient had a decrease in activity level because of a fear of falling?  Yes    Is the patient reluctant to leave their home because of a fear of falling?  Yes      Home Environment   Additional Comments single story house, walks dogs      Prior Function   Level of Independence Independent;Independent with basic ADLs;Needs assistance with homemaking    Vocation Retired    Leisure fell off bike about 2 months ago and broke ribs      ROM / Strength   AROM / PROM / Strength AROM;Strength      AROM   Overall AROM Comments Knee ROM WFL's, Lumbar ROM decrease 75%, left shoulder ROM is poor with poor UE contorl      Strength   Overall Strength Comments hips 4-/5, knees 4-/5 extension , 3+5/ HS, ankles 4/5, Left UE strength is 4-/5 with poor contorl and poor attention to task      Flexibility   Soft Tissue Assessment /Muscle Length yes    Hamstrings very tight HS and calves      Palpation   Palpation comment tender and tight in the lumbar paraspinals and the buttocks      Ambulation/Gait   Gait Comments no device, slow, no arm swing, shuffles, drags the left leg, left arm held in a gaurded position against the body, step to pattern      Standardized Balance Assessment   Standardized Balance Assessment Timed Up and Go Test;Berg Balance Test      Berg Balance Test   Sit to Stand Able to stand  independently using hands    Standing Unsupported Able to stand 2 minutes with supervision    Sitting with Back Unsupported but Feet Supported on Floor or Stool Able to sit safely and securely 2 minutes    Stand to Sit Controls descent by using hands    Transfers Able to transfer safely, definite need of hands    Standing  Unsupported with Eyes Closed Able to stand 10 seconds with supervision    Standing  Unsupported with Feet Together Able to place feet together independently and stand for 1 minute with supervision    From Standing, Reach Forward with Outstretched Arm Can reach forward >12 cm safely (5")    From Standing Position, Pick up Object from [place] to pick up shoe, needs supervision    From Standing Position, Turn to Look Behind Over each Shoulder Turn sideways only but maintains balance    Turn 360 Degrees Able to turn 360 degrees safely but slowly    Standing Unsupported, Alternately Place Feet on Step/Stool Able to complete 4 steps without aid or supervision    Standing Unsupported, One Foot in Front Needs help to step but can hold 15 seconds    Standing on One Leg Able to lift leg independently and hold equal to or more than 3 seconds    Total Score 37      Timed Up and Go Test   Normal TUG (seconds) 17                        Objective measurements completed on examination: See above findings.                  PT Short Term Goals - 02/24/21 1847       PT SHORT TERM GOAL #1   Title independent with initial HEP    Time 2    Period Weeks    Status New               PT Long Term Goals - 02/24/21 1847       PT LONG TERM GOAL #1   Title Understand fall risks, the importance of exercise and flexibility to health and function    Time 12    Period Weeks    Status New      PT LONG TERM GOAL #2   Title decrease TUG time to 13 seconds    Time 12    Period Weeks    Status New      PT LONG TERM GOAL #3   Title walk around our building without rest     Time 12    Period Weeks      PT LONG TERM GOAL #4   Title decrease pain with walking by 50%    Time 12    Period Weeks    Status New      PT LONG TERM GOAL #5   Title increase Berg blance test score to 46/56    Time 12    Period Weeks    Status New                    Plan -  02/24/21 1843     Clinical Impression Statement Patient has had at least 4 falls over the past 6 months.  He sustained recent rib fractures on the left and an L1 compression fracture, he underwent a kyphoplasty 02/12/21.  He reports less pain since that time but still having back and rib pain.  He has a history of stroke and has been recently been diagnosed with dementia.  His gait is with the left arm against his side, no arm swing, a step to pattern with him shuffling the left leg, tolerance is about 1 minute and then reports fatigue and back pain.  MMT shows slightly less strength on the left but when it is functional he has very poor strength possiblty due to lack  of attention, has difficulty with multi tasking, TUG was 18 seconds and Berg balance score was 37/56 putting him at a high risk for falls    Stability/Clinical Decision Making Evolving/Moderate complexity    Clinical Decision Making Moderate    Rehab Potential Good    PT Frequency 2x / week    PT Duration 12 weeks    PT Treatment/Interventions ADLs/Self Care Home Management;Electrical Stimulation;Moist Heat;Gait training;Neuromuscular re-education;Balance training;Therapeutic exercise;Therapeutic activities;Functional mobility training;Stair training;Patient/family education;Manual techniques    PT Next Visit Plan start balance and functional activitiees to help with safety, treat pain as needed    Consulted and Agree with Plan of Care Patient             Patient will benefit from skilled therapeutic intervention in order to improve the following deficits and impairments:  Abnormal gait, Decreased coordination, Decreased range of motion, Difficulty walking, Increased muscle spasms, Cardiopulmonary status limiting activity, Decreased endurance, Decreased activity tolerance, Pain, Impaired flexibility, Decreased balance, Decreased mobility, Decreased strength, Postural dysfunction  Visit Diagnosis: Hemiplegia and hemiparesis following  cerebral infarction affecting left non-dominant side ([place]) - Plan: PT plan of care cert/re-cert  Other lack of coordination - Plan: PT plan of care cert/re-cert  Difficulty in walking, not elsewhere classified - Plan: PT plan of care cert/re-cert  Muscle weakness (generalized) - Plan: PT plan of care cert/re-cert  Repeated falls - Plan: PT plan of care cert/re-cert  Other abnormalities of gait and mobility - Plan: PT plan of care cert/re-cert  Acute bilateral low back pain without sciatica - Plan: PT plan of care cert/re-cert     Problem List Patient Active Problem List   Diagnosis Date Noted   Closed fracture of first lumbar vertebra with routine healing 02/03/2021   Closed fracture of multiple ribs 11/18/2020   Depression with anxiety 01/29/2020   Leg pain, bilateral 01/29/2020   Ingrown toenail 07/13/2019   Lumbar spondylosis 05/02/2018   Pain in left knee 03/09/2018   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   Trochanteric bursitis of left hip 01/16/2018   Preventative health care 09/26/2017   HTN (hypertension) 07/19/2015   Hyperlipidemia 07/19/2015   Great toe pain 02/11/2014   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 [place], [name], [address] Phone: [phone]   Fax:  [phone]  Name: KEINAN BROUILLET MRN: [id number] Date of Birth: 05-07-1941

## 2021-02-26 ENCOUNTER — Other Ambulatory Visit (HOSPITAL_BASED_OUTPATIENT_CLINIC_OR_DEPARTMENT_OTHER): Payer: Self-pay

## 2021-03-02 ENCOUNTER — Encounter: Payer: Self-pay | Admitting: Physical Therapy

## 2021-03-02 ENCOUNTER — Ambulatory Visit: Payer: PPO | Admitting: Physical Therapy

## 2021-03-02 ENCOUNTER — Other Ambulatory Visit: Payer: Self-pay

## 2021-03-02 DIAGNOSIS — R296 Repeated falls: Secondary | ICD-10-CM

## 2021-03-02 DIAGNOSIS — R278 Other lack of coordination: Secondary | ICD-10-CM

## 2021-03-02 DIAGNOSIS — R2689 Other abnormalities of gait and mobility: Secondary | ICD-10-CM

## 2021-03-02 DIAGNOSIS — I69354 Hemiplegia and hemiparesis following cerebral infarction affecting left non-dominant side: Secondary | ICD-10-CM | POA: Diagnosis not present

## 2021-03-02 DIAGNOSIS — R262 Difficulty in walking, not elsewhere classified: Secondary | ICD-10-CM

## 2021-03-02 DIAGNOSIS — M6281 Muscle weakness (generalized): Secondary | ICD-10-CM

## 2021-03-02 DIAGNOSIS — M545 Low back pain, unspecified: Secondary | ICD-10-CM

## 2021-03-02 NOTE — Therapy (Signed)
[place]. [place], [name], [address] Phone: [phone]   Fax:  [phone]  Physical Therapy Treatment  Patient Details  Name: William [name] MRN: [id number] Date of Birth: [birth year] Referring Provider (PT): Etter Sjogren   Encounter Date: 03/02/2021   PT End of Session - 03/02/21 1441     Visit Number 2    Date for PT Re-Evaluation 05/26/21    PT Start Time 1400    PT Stop Time 9563    PT Time Calculation (min) 58 min    Activity Tolerance Patient tolerated treatment well    Behavior During Therapy [facility] for tasks assessed/performed             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   Mild, related to stroke history   Nocturia    Renal insufficiency 06/25/2013   S/P carotid endarterectomy    BILATERAL ICA--  PATENT PER DUPLEX  05-19-2012   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   Right calf   Urinary frequency    Vitamin D deficiency     Past Surgical History:  Procedure Laterality Date   APPENDECTOMY  AS CHILD   CARDIOVASCULAR STRESS TEST  03-27-2012  DR CRENSHAW   LOW RISK LEXISCAN STUDY-- PROBABLE NORMAL PERFUSION AND SOFT TISSUE ATTENUATION/  NO ISCHEMIA/ EF 51%   CAROTID ENDARTERECTOMY Bilateral LEFT  11-12-2008  DR GREG HAYES   RIGHT ICA  2006  ([facility])   CYSTOSCOPY WITH LITHOLAPAXY  N/A 02/26/2013   Procedure: CYSTOSCOPY WITH LITHOLAPAXY;  Surgeon: Franchot Gallo, MD;  Location: [facility];  Service: Urology;  Laterality: N/A;   EYE SURGERY  Jan. 2016   cataract surgery both eyes   INGUINAL HERNIA REPAIR Right 11-08-2006   IR KYPHO EA ADDL LEVEL THORACIC OR LUMBAR  02/12/2021   IR RADIOLOGIST EVAL & MGMT  02/18/2021   MASS EXCISION N/A 03/03/2016   Procedure: EXCISION OF BACK  MASS;  Surgeon: Stark Klein, MD;  Location: [facility];  Service: General;  Laterality: N/A;   MOHS SURGERY Left 1/ 2016   Dr Nevada Crane-- Basal cell   PROSTATE SURGERY     TRANSURETHRAL RESECTION OF PROSTATE N/A 02/26/2013   Procedure: TRANSURETHRAL RESECTION OF THE PROSTATE WITH GYRUS INSTRUMENTS;  Surgeon: Franchot Gallo, MD;  Location: [facility];  Service: Urology;  Laterality: N/A;    There were no vitals filed for this visit.   Subjective Assessment - 03/02/21 1407     Subjective I hurting, worse with walking    Currently in Pain? Yes    Pain Score 2     Pain Location Back    Pain Orientation Lower    Pain Descriptors / Indicators Aching;Sore    Aggravating Factors  pain up to 8/10                               [facility] Adult PT Treatment/Exercise - 03/02/21 0001       Ambulation/Gait   Gait Comments gait no device, slow, a lot of cues for step length and arm swing      High Level Balance   High Level Balance Activities Head turns    High Level Balance Comments eyes closed, right foot on airex, left on solid ground ball tossing, very tough with this      Exercises   Exercises Lumbar      Lumbar Exercises: Aerobic   Nustep level 3 x 5 minutes      Lumbar Exercises: Seated   Long Arc Quad on Chair Both;2 sets;10 reps    LAQ on Chair Weights (lbs) 3    Sit to Stand Limitations HS curls and Ankle DF/PF 2x10 each leg    Other Seated Lumbar Exercises ball b/n knees squeeze, red tband hip abduction, pball in lap  isometric abs      Modalities   Modalities Electrical Stimulation;Moist Heat      Moist Heat Therapy   Number Minutes Moist Heat 10 Minutes    Moist Heat Location Lumbar Spine      Electrical Stimulation   Electrical Stimulation Location lumbar spine    Electrical Stimulation Action IFC    Electrical Stimulation Parameters sitting    Electrical Stimulation Goals Pain                       PT Short Term Goals - 02/24/21 1847       PT SHORT TERM GOAL #1   Title independent with initial HEP    Time 2    Period Weeks    Status New               PT Long Term Goals - 02/24/21 1847       PT LONG TERM GOAL #1   Title Understand fall risks, the importance of exercise and flexibility to health and function    Time 12    Period Weeks    Status New      PT LONG TERM GOAL #2   Title decrease TUG time to 13 seconds    Time 12    Period Weeks    Status New      PT LONG TERM GOAL #3   Title walk around our building without rest     Time 12    Period Weeks      PT LONG TERM GOAL #4   Title decrease pain with walking by 50%    Time 12    Period Weeks    Status New      PT LONG TERM GOAL #5   Title increase Berg blance test score to 46/56    Time 12    Period Weeks    Status New                   Plan - 03/02/21 1442     Clinical Impression Statement Patient tolerated the start of exercises okay, mostly sitting as standing does increase the LBP, he reports dizziness with eyes closed at home in the shower, we tried some here for only 5 seconds at a time and he did well, he does not like and has a  lot of difficulty with the dynamic surfaces, he also does not have left arm swing and has left foot shuffle    PT Next Visit Plan start balance and functional activitiees to help with safety, treat pain as needed    Consulted and Agree with Plan of Care Patient             Patient will benefit from skilled therapeutic intervention in order to  improve the following deficits and impairments:  Abnormal gait, Decreased coordination, Decreased range of motion, Difficulty walking, Increased muscle spasms, Cardiopulmonary status limiting activity, Decreased endurance, Decreased activity tolerance, Pain, Impaired flexibility, Decreased balance, Decreased mobility, Decreased strength, Postural dysfunction  Visit Diagnosis: Hemiplegia and hemiparesis following cerebral infarction affecting left non-dominant side ([facility])  Other lack of coordination  Difficulty in walking, not elsewhere classified  Muscle weakness (generalized)  Repeated falls  Other abnormalities of gait and mobility  Acute bilateral low back pain without sciatica     Problem List Patient Active Problem List   Diagnosis Date Noted   Closed fracture of first lumbar vertebra with routine healing 02/03/2021   Closed fracture of multiple ribs 11/18/2020   Depression with anxiety 01/29/2020   Leg pain, bilateral 01/29/2020   Ingrown toenail 07/13/2019   Lumbar spondylosis 05/02/2018   Pain in left knee 03/09/2018   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   Trochanteric bursitis of left hip 01/16/2018   Preventative health care 09/26/2017   HTN (hypertension) 07/19/2015   Hyperlipidemia 07/19/2015   Great toe pain 02/11/2014   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 [place], [name],  [address] Phone: [phone]   Fax:  [phone]  Name: [name] MRN: [id number] Date of Birth: [birth year]

## 2021-03-04 ENCOUNTER — Other Ambulatory Visit: Payer: Self-pay

## 2021-03-04 ENCOUNTER — Encounter: Payer: Self-pay | Admitting: Physical Therapy

## 2021-03-04 ENCOUNTER — Ambulatory Visit: Payer: PPO | Admitting: Physical Therapy

## 2021-03-04 DIAGNOSIS — M6281 Muscle weakness (generalized): Secondary | ICD-10-CM

## 2021-03-04 DIAGNOSIS — I69354 Hemiplegia and hemiparesis following cerebral infarction affecting left non-dominant side: Secondary | ICD-10-CM

## 2021-03-04 DIAGNOSIS — M545 Low back pain, unspecified: Secondary | ICD-10-CM

## 2021-03-04 DIAGNOSIS — R2689 Other abnormalities of gait and mobility: Secondary | ICD-10-CM

## 2021-03-04 DIAGNOSIS — R262 Difficulty in walking, not elsewhere classified: Secondary | ICD-10-CM

## 2021-03-04 DIAGNOSIS — R296 Repeated falls: Secondary | ICD-10-CM

## 2021-03-04 DIAGNOSIS — R278 Other lack of coordination: Secondary | ICD-10-CM

## 2021-03-04 NOTE — Therapy (Signed)
[place]. [place], [name], [address] Phone: [phone]   Fax:  [phone]  Physical Therapy Treatment  Patient Details  Name: William [name] MRN: [id number] Date of Birth: 1940/11/26 Referring Provider (PT): Etter Sjogren   Encounter Date: 03/04/2021   PT End of Session - 03/04/21 1446     Visit Number 3    Date for PT Re-Evaluation 05/26/21    PT Start Time 1401    PT Stop Time 1450    PT Time Calculation (min) 49 min    Activity Tolerance Patient tolerated treatment well    Behavior During Therapy [facility] for tasks assessed/performed             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   Mild, related to stroke history   Nocturia    Renal insufficiency 06/25/2013   S/P carotid endarterectomy    BILATERAL ICA--  PATENT PER DUPLEX  05-19-2012   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   Right calf   Urinary frequency    Vitamin D deficiency     Past Surgical History:  Procedure Laterality Date   APPENDECTOMY  AS CHILD   CARDIOVASCULAR STRESS TEST  03-27-2012  DR CRENSHAW   LOW RISK LEXISCAN STUDY-- PROBABLE NORMAL PERFUSION AND SOFT TISSUE ATTENUATION/  NO ISCHEMIA/ EF 51%   CAROTID ENDARTERECTOMY Bilateral LEFT  11-12-2008  DR GREG HAYES   RIGHT ICA  2006  ([facility])   CYSTOSCOPY WITH LITHOLAPAXY  N/A 02/26/2013   Procedure: CYSTOSCOPY WITH LITHOLAPAXY;  Surgeon: Franchot Gallo, MD;  Location: [facility];  Service: Urology;  Laterality: N/A;   EYE SURGERY  Jan. 2016   cataract surgery both eyes   INGUINAL HERNIA REPAIR Right 11-08-2006   IR KYPHO EA ADDL LEVEL THORACIC OR LUMBAR  02/12/2021   IR RADIOLOGIST EVAL & MGMT  02/18/2021   MASS EXCISION N/A 03/03/2016   Procedure: EXCISION OF BACK  MASS;  Surgeon: Stark Klein, MD;  Location: [place];  Service: General;  Laterality: N/A;   MOHS SURGERY Left 1/ 2016   Dr Nevada Crane-- Basal cell   PROSTATE SURGERY     TRANSURETHRAL RESECTION OF PROSTATE N/A 02/26/2013   Procedure: TRANSURETHRAL RESECTION OF THE PROSTATE WITH GYRUS INSTRUMENTS;  Surgeon: Franchot Gallo, MD;  Location: [facility];  Service: Urology;  Laterality: N/A;    There were no vitals filed for this visit.   Subjective Assessment - 03/04/21 1406     Subjective Doing better today, I have not walked because it is hot    Currently in Pain? Yes    Pain Score 2     Pain Location Back    Pain Orientation Lower    Pain Descriptors / Indicators  Aching    Aggravating Factors  walking                               [facility] Adult PT Treatment/Exercise - 03/04/21 0001       Ambulation/Gait   Gait Comments gait with SPC trying to get a better step length and heel strike, had a lot of trouble with the sequencing, then we did with a very light HHA for speed and step length      High Level Balance   High Level Balance Activities Side stepping;Backward walking    High Level Balance Comments ball kicks, ball tosses, 4" toe taps, 6" toe taps and cone taps, with SPC 14" toe touches, standing in Pbars touching cones with feet with PT calling out the color to touch, standing picking up cones      Neuro Re-ed    Neuro Re-ed Details  R/L fwd stepping onto dynadisc 10x each with II bar; R toe tap on 3 cones without UE  support 10x   HHA on L to avoid grapping II bar     Lumbar Exercises: Aerobic   Nustep level 4 x 5 minutes      Lumbar Exercises: Standing   Row Both;20 reps;Theraband    Theraband Level (Row) Level 2 (Red)    Row Limitations a lot of cues for the left as he has inattention      Lumbar Exercises: Seated   Long Arc Quad on Chair Both;2 sets;10 reps    LAQ on Chair Weights (lbs) 3    Sit to Stand Limitations HS curls and Ankle DF/PF 2x10 each leg red tband                       PT Short Term Goals - 03/04/21 1448       PT SHORT TERM GOAL #1   Title independent with initial HEP    Status On-going               PT Long Term Goals - 03/04/21 1448       PT LONG TERM GOAL #1   Title Understand fall risks, the importance of exercise and flexibility to health and function    Status On-going                   Plan - 03/04/21 1446     Clinical Impression Statement Patient very fearful and unsure of him self, does not like to get out of his comfort zone, we did explain to him that is how we improve trying to push the boundaries, with the [facility] today he wsa unsafe due to poor sequencing and could not correct.  He is unsure of himselft with the balance activities, does better with straight line walking    PT Next Visit Plan start balance and functional activitiees to help with safety, treat pain as needed    Consulted and Agree with Plan of Care Patient             Patient will benefit from skilled therapeutic intervention in order to improve the following deficits and impairments:  Abnormal gait, Decreased coordination, Decreased range of motion, Difficulty walking, Increased muscle spasms, Cardiopulmonary status limiting activity, Decreased endurance, Decreased activity tolerance, Pain, Impaired flexibility, Decreased balance, Decreased mobility, Decreased strength, Postural dysfunction  Visit Diagnosis: Hemiplegia and hemiparesis following cerebral  infarction affecting left non-dominant side (HCC)  Other lack of coordination  Difficulty in walking, not elsewhere classified  Muscle weakness (generalized)  Repeated falls  Other abnormalities of gait and mobility  Acute bilateral low back pain without sciatica     Problem List Patient Active Problem List   Diagnosis Date Noted   Closed fracture of first lumbar vertebra with routine healing 02/03/2021   Closed fracture of multiple ribs 11/18/2020   Depression with anxiety 01/29/2020   Leg pain, bilateral 01/29/2020   Ingrown toenail 07/13/2019   Lumbar spondylosis 05/02/2018   Pain in left knee 03/09/2018   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   Trochanteric bursitis of left hip 01/16/2018   Preventative health care 09/26/2017   HTN (hypertension) 07/19/2015   Hyperlipidemia 07/19/2015   Great toe pain 02/11/2014   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 [place], [name], [address] Phone: [phone]   Fax:  [phone]  Name: William [name] MRN: [id number] Date of Birth: 31-Jan-1941

## 2021-03-09 ENCOUNTER — Other Ambulatory Visit: Payer: Self-pay

## 2021-03-09 ENCOUNTER — Encounter: Payer: Self-pay | Admitting: Physical Therapy

## 2021-03-09 ENCOUNTER — Ambulatory Visit: Payer: PPO | Admitting: Physical Therapy

## 2021-03-09 DIAGNOSIS — M545 Low back pain, unspecified: Secondary | ICD-10-CM

## 2021-03-09 DIAGNOSIS — I69354 Hemiplegia and hemiparesis following cerebral infarction affecting left non-dominant side: Secondary | ICD-10-CM | POA: Diagnosis not present

## 2021-03-09 DIAGNOSIS — R262 Difficulty in walking, not elsewhere classified: Secondary | ICD-10-CM

## 2021-03-09 DIAGNOSIS — R278 Other lack of coordination: Secondary | ICD-10-CM

## 2021-03-09 DIAGNOSIS — R2689 Other abnormalities of gait and mobility: Secondary | ICD-10-CM

## 2021-03-09 DIAGNOSIS — M6281 Muscle weakness (generalized): Secondary | ICD-10-CM

## 2021-03-09 DIAGNOSIS — R296 Repeated falls: Secondary | ICD-10-CM

## 2021-03-09 NOTE — Therapy (Signed)
[place]. [place], [name], [address] Phone: [phone]   Fax:  [phone]  Physical Therapy Treatment  Patient Details  Name: William [name] MRN: [id number] Date of Birth: September 21, 1940 Referring Provider (PT): Etter Sjogren   Encounter Date: 03/09/2021   PT End of Session - 03/09/21 1400     Visit Number 4    Date for PT Re-Evaluation 05/26/21    PT Start Time 1311    PT Stop Time 1400    PT Time Calculation (min) 49 min    Activity Tolerance Patient tolerated treatment well    Behavior During Therapy [facility] for tasks assessed/performed             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   Mild, related to stroke history   Nocturia    Renal insufficiency 06/25/2013   S/P carotid endarterectomy    BILATERAL ICA--  PATENT PER DUPLEX  05-19-2012   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   Right calf   Urinary frequency    Vitamin D deficiency     Past Surgical History:  Procedure Laterality Date   APPENDECTOMY  AS CHILD   CARDIOVASCULAR STRESS TEST  03-27-2012  DR CRENSHAW   LOW RISK LEXISCAN STUDY-- PROBABLE NORMAL PERFUSION AND SOFT TISSUE ATTENUATION/  NO ISCHEMIA/ EF 51%   CAROTID ENDARTERECTOMY Bilateral LEFT  11-12-2008  DR GREG HAYES   RIGHT ICA  2006  ([facility])   CYSTOSCOPY WITH LITHOLAPAXY  N/A 02/26/2013   Procedure: CYSTOSCOPY WITH LITHOLAPAXY;  Surgeon: Franchot Gallo, MD;  Location: [facility];  Service: Urology;  Laterality: N/A;   EYE SURGERY  Jan. 2016   cataract surgery both eyes   INGUINAL HERNIA REPAIR Right 11-08-2006   IR KYPHO EA ADDL LEVEL THORACIC OR LUMBAR  02/12/2021   IR RADIOLOGIST EVAL & MGMT  02/18/2021   MASS EXCISION N/A 03/03/2016   Procedure: EXCISION OF BACK  MASS;  Surgeon: Stark Klein, MD;  Location: [place];  Service: General;  Laterality: N/A;   MOHS SURGERY Left 1/ 2016   Dr Nevada Crane-- Basal cell   PROSTATE SURGERY     TRANSURETHRAL RESECTION OF PROSTATE N/A 02/26/2013   Procedure: TRANSURETHRAL RESECTION OF THE PROSTATE WITH GYRUS INSTRUMENTS;  Surgeon: Franchot Gallo, MD;  Location: [facility];  Service: Urology;  Laterality: N/A;    There were no vitals filed for this visit.   Subjective Assessment - 03/09/21 1324     Subjective "feel really weak, having trouble walking"    Currently in Pain? Yes    Pain Score 2     Pain Location Back    Pain Orientation Lower    Pain Descriptors / Indicators Aching;Sore    Aggravating  Factors  just hurting                               [facility] Adult PT Treatment/Exercise - 03/09/21 0001       Ambulation/Gait   Gait Comments walked outside with HHA in the grass, needed a lot of cues to pick up feet, tended to keep the left leg bent and does not heel strike, 1 walk with close CGA x120 feet, then one walk CGA/HHA x 225 feet, again a lot of cues for heel strikd and to straighten the left leg      High Level Balance   High Level Balance Comments ball kicks, ball tosses, 4" toe taps, 6" toe taps and cone taps, with SPC 14" toe touches, standing in Pbars touching cones with feet with PT calling out the color to touch, standing picking up cones, right foot propped up on the 6" step balancing on the left      Lumbar Exercises: Aerobic    Nustep level 4 x 5 minutes      Lumbar Exercises: Standing   Heel Raises 20 reps;1 second    Heel Raises Limitations using FWW    Row Both;20 reps;Theraband    Theraband Level (Row) Level 2 (Red)    Row Limitations cues for for left    Other Standing Lumbar Exercises left toe taps, standeing in the walker      Lumbar Exercises: Seated   Long Arc Quad on Chair Left;3 sets;10 reps    LAQ on Chair Weights (lbs) 3    Sit to Stand Limitations sit to stand 5x 3 stes with him holding weighted ball    Other Seated Lumbar Exercises 3# marches left only 2x10                       PT Short Term Goals - 03/04/21 1448       PT SHORT TERM GOAL #1   Title independent with initial HEP    Status On-going               PT Long Term Goals - 03/04/21 1448       PT LONG TERM GOAL #1   Title Understand fall risks, the importance of exercise and flexibility to health and function    Status On-going                   Plan - 03/09/21 1401     Clinical Impression Statement William [name] really having difficulty walking today, the left knee tending to go into flexion, needed a lot of cues for heel strike and to straighten the knee, he could not correct.  He needed cues to stop and regain blance and try to stand up, but this was difficult for him today  He does report no walking over the weekend and mostly sitting in his chair    PT Next Visit Plan start balance and functional activitiees to help with safety, treat pain as needed    Consulted and Agree with Plan of Care Patient             Patient will benefit from skilled therapeutic intervention in order to improve the following deficits and impairments:  Abnormal gait, Decreased coordination, Decreased range of motion, Difficulty walking, Increased muscle spasms, Cardiopulmonary status limiting activity, Decreased endurance, Decreased activity tolerance, Pain, Impaired flexibility, Decreased balance, Decreased mobility,  Decreased strength, Postural  dysfunction  Visit Diagnosis: Hemiplegia and hemiparesis following cerebral infarction affecting left non-dominant side ([place])  Other lack of coordination  Difficulty in walking, not elsewhere classified  Muscle weakness (generalized)  Repeated falls  Other abnormalities of gait and mobility  Acute bilateral low back pain without sciatica     Problem List Patient Active Problem List   Diagnosis Date Noted   Closed fracture of first lumbar vertebra with routine healing 02/03/2021   Closed fracture of multiple ribs 11/18/2020   Depression with anxiety 01/29/2020   Leg pain, bilateral 01/29/2020   Ingrown toenail 07/13/2019   Lumbar spondylosis 05/02/2018   Pain in left knee 03/09/2018   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   Trochanteric bursitis of left hip 01/16/2018   Preventative health care 09/26/2017   HTN (hypertension) 07/19/2015   Hyperlipidemia 07/19/2015   Great toe pain 02/11/2014   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 [place], [name], [address] Phone: [phone]   Fax:  [phone]  Name: CALLUM WOLF MRN: [id number] Date of Birth: May 04, 1941

## 2021-03-11 ENCOUNTER — Encounter: Payer: Self-pay | Admitting: Physical Therapy

## 2021-03-11 ENCOUNTER — Other Ambulatory Visit: Payer: Self-pay | Admitting: Family Medicine

## 2021-03-11 ENCOUNTER — Other Ambulatory Visit: Payer: Self-pay

## 2021-03-11 ENCOUNTER — Ambulatory Visit: Payer: PPO | Admitting: Physical Therapy

## 2021-03-11 DIAGNOSIS — R2689 Other abnormalities of gait and mobility: Secondary | ICD-10-CM

## 2021-03-11 DIAGNOSIS — M6281 Muscle weakness (generalized): Secondary | ICD-10-CM

## 2021-03-11 DIAGNOSIS — R262 Difficulty in walking, not elsewhere classified: Secondary | ICD-10-CM

## 2021-03-11 DIAGNOSIS — R278 Other lack of coordination: Secondary | ICD-10-CM

## 2021-03-11 DIAGNOSIS — I69354 Hemiplegia and hemiparesis following cerebral infarction affecting left non-dominant side: Secondary | ICD-10-CM | POA: Diagnosis not present

## 2021-03-11 DIAGNOSIS — R296 Repeated falls: Secondary | ICD-10-CM

## 2021-03-11 DIAGNOSIS — M545 Low back pain, unspecified: Secondary | ICD-10-CM

## 2021-03-11 NOTE — Therapy (Signed)
[place]. [place], [name], [address] Phone: [phone]   Fax:  [phone]  Physical Therapy Treatment  Patient Details  Name: William [name] MRN: [id number] Date of Birth: [birth year] Referring Provider (PT): Etter Sjogren   Encounter Date: 03/11/2021   PT End of Session - 03/11/21 1441     Visit Number 5    Date for PT Re-Evaluation 05/26/21    PT Start Time 1400    PT Stop Time 1443    PT Time Calculation (min) 43 min    Activity Tolerance Patient tolerated treatment well    Behavior During Therapy [facility] for tasks assessed/performed             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   Mild, related to stroke history   Nocturia    Renal insufficiency 06/25/2013   S/P carotid endarterectomy    BILATERAL ICA--  PATENT PER DUPLEX  05-19-2012   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   Right calf   Urinary frequency    Vitamin D deficiency     Past Surgical History:  Procedure Laterality Date   APPENDECTOMY  AS CHILD   CARDIOVASCULAR STRESS TEST  03-27-2012  DR CRENSHAW   LOW RISK LEXISCAN STUDY-- PROBABLE NORMAL PERFUSION AND SOFT TISSUE ATTENUATION/  NO ISCHEMIA/ EF 51%   CAROTID ENDARTERECTOMY Bilateral LEFT  11-12-2008  DR GREG HAYES   RIGHT ICA  2006  ([facility])   CYSTOSCOPY WITH LITHOLAPAXY  N/A 02/26/2013   Procedure: CYSTOSCOPY WITH LITHOLAPAXY;  Surgeon: Franchot Gallo, MD;  Location: [facility];  Service: Urology;  Laterality: N/A;   EYE SURGERY  Jan. 2016   cataract surgery both eyes   INGUINAL HERNIA REPAIR Right 11-08-2006   IR KYPHO EA ADDL LEVEL THORACIC OR LUMBAR  02/12/2021   IR RADIOLOGIST EVAL & MGMT  02/18/2021   MASS EXCISION N/A 03/03/2016   Procedure: EXCISION OF BACK  MASS;  Surgeon: Stark Klein, MD;  Location: [facility];  Service: General;  Laterality: N/A;   MOHS SURGERY Left 1/ 2016   Dr Nevada Crane-- Basal cell   PROSTATE SURGERY     TRANSURETHRAL RESECTION OF PROSTATE N/A 02/26/2013   Procedure: TRANSURETHRAL RESECTION OF THE PROSTATE WITH GYRUS INSTRUMENTS;  Surgeon: Franchot Gallo, MD;  Location: [facility];  Service: Urology;  Laterality: N/A;    There were no vitals filed for this visit.   Subjective Assessment - 03/11/21 1404     Subjective Doing a little better, less pain, I did walk today  with some difficulty with left knee weakness    Currently in Pain? No/denies  [place] Adult PT Treatment/Exercise - 03/11/21 0001       Ambulation/Gait   Gait Comments outside in the grass, negotiated a curb, HHA needed, shuffling feet, stairs step over step up and down, outside on sidewalk and pavement 225 feet, close S, to some CGA,      High Level Balance   High Level Balance Activities Side stepping;Backward walking    High Level Balance Comments ball kicks, on airex in pbars 6" toe touches, ball toss      Lumbar Exercises: Aerobic   Nustep level 5 x 6 minutes      Lumbar Exercises: Seated   Long Arc Quad on Chair Left;3 sets;10 reps    Sit to Stand Limitations sit to stand 5x 3 stes with him holding weighted ball    Other Seated Lumbar Exercises 3# marches left only 2x10    Other Seated Lumbar Exercises green tband HS curls, green tband left arm row, a lot  of cues for the left arm, left arm extension                       PT Short Term Goals - 03/04/21 1448       PT SHORT TERM GOAL #1   Title independent with initial HEP    Status On-going               PT Long Term Goals - 03/11/21 1447       PT LONG TERM GOAL #1   Title Understand fall risks, the importance of exercise and flexibility to health and function    Status On-going      PT LONG TERM GOAL #2   Title decrease TUG time to 13 seconds    Status On-going      PT LONG TERM GOAL #3   Title walk around our building without rest     Status On-going      PT LONG TERM GOAL #4   Title decrease pain with walking by 50%    Status On-going                   Plan - 03/11/21 1442     Clinical Impression Statement Much improved today, less left knee buckling, less cues needed, for heel strike, still cues needed for arm swing, with the left arm specific activities he really needed cues to do full ROM.  I spoke with him and his wife they are unsure of any specific reason for the difference today vs last visit.  He still is very unsure on the dynamic surface and feels very unsure, with turns he does report getting dizzy easy    PT Next Visit Plan continue to work on function, balance and strength    Consulted and Agree with Plan of Care Patient             Patient will benefit from skilled therapeutic intervention in order to improve the following deficits and impairments:  Abnormal gait, Decreased coordination, Decreased range of motion, Difficulty walking, Increased muscle spasms, Cardiopulmonary status limiting activity, Decreased endurance, Decreased activity tolerance, Pain, Impaired flexibility, Decreased balance, Decreased mobility, Decreased strength, Postural dysfunction  Visit Diagnosis: Hemiplegia and hemiparesis following cerebral infarction affecting left non-dominant side (HCC)  Other lack of coordination  Difficulty in walking, not  elsewhere classified  Muscle weakness (generalized)  Repeated falls  Other abnormalities of gait and mobility  Acute bilateral low back pain without sciatica  Problem List Patient Active Problem List   Diagnosis Date Noted   Closed fracture of first lumbar vertebra with routine healing 02/03/2021   Closed fracture of multiple ribs 11/18/2020   Depression with anxiety 01/29/2020   Leg pain, bilateral 01/29/2020   Ingrown toenail 07/13/2019   Lumbar spondylosis 05/02/2018   Pain in left knee 03/09/2018   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   Trochanteric bursitis of left hip 01/16/2018   Preventative health care 09/26/2017   HTN (hypertension) 07/19/2015   Hyperlipidemia 07/19/2015   Great toe pain 02/11/2014   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 [place], [name], [address] Phone: [phone]   Fax:  [phone]  Name: William [name] MRN: [id number] Date of Birth: 01-31-1941

## 2021-03-12 ENCOUNTER — Ambulatory Visit: Payer: PPO | Attending: Internal Medicine

## 2021-03-12 ENCOUNTER — Encounter: Payer: Self-pay | Admitting: Family Medicine

## 2021-03-12 ENCOUNTER — Other Ambulatory Visit (HOSPITAL_BASED_OUTPATIENT_CLINIC_OR_DEPARTMENT_OTHER): Payer: Self-pay

## 2021-03-12 DIAGNOSIS — Z23 Encounter for immunization: Secondary | ICD-10-CM

## 2021-03-12 MED ORDER — INFLUENZA VAC A&B SA ADJ QUAD 0.5 ML IM PRSY
PREFILLED_SYRINGE | INTRAMUSCULAR | 0 refills | Status: DC
  Filled 2021-03-12: qty 0.5, 1d supply, fill #0

## 2021-03-12 MED ORDER — FENOFIBRATE 160 MG PO TABS
160.0000 mg | ORAL_TABLET | Freq: Every day | ORAL | 1 refills | Status: DC
  Filled 2021-03-12: qty 90, 90d supply, fill #0
  Filled 2021-06-07: qty 90, 90d supply, fill #1

## 2021-03-13 NOTE — Progress Notes (Signed)
   Covid-19 Vaccination Clinic  Name:  DONTREY SNELLGROVE    MRN: [id number] DOB: Aug 14, 1940  03/13/2021  Mr. Highbaugh was observed post Covid-19 immunization for 15 minutes without incident. He was provided with Vaccine Information Sheet and instruction to access the V-Safe system.   Mr. Rufo was instructed to call 911 with any severe reactions post vaccine: Difficulty breathing  Swelling of face and throat  A fast heartbeat  A bad rash all over body  Dizziness and weakness

## 2021-03-16 ENCOUNTER — Encounter: Payer: Self-pay | Admitting: Physical Therapy

## 2021-03-16 ENCOUNTER — Other Ambulatory Visit: Payer: Self-pay

## 2021-03-16 ENCOUNTER — Other Ambulatory Visit (INDEPENDENT_AMBULATORY_CARE_PROVIDER_SITE_OTHER): Payer: PPO

## 2021-03-16 ENCOUNTER — Ambulatory Visit: Payer: PPO | Attending: Family Medicine | Admitting: Physical Therapy

## 2021-03-16 DIAGNOSIS — R2689 Other abnormalities of gait and mobility: Secondary | ICD-10-CM | POA: Diagnosis not present

## 2021-03-16 DIAGNOSIS — M6281 Muscle weakness (generalized): Secondary | ICD-10-CM | POA: Diagnosis not present

## 2021-03-16 DIAGNOSIS — R262 Difficulty in walking, not elsewhere classified: Secondary | ICD-10-CM | POA: Insufficient documentation

## 2021-03-16 DIAGNOSIS — R296 Repeated falls: Secondary | ICD-10-CM | POA: Insufficient documentation

## 2021-03-16 DIAGNOSIS — R748 Abnormal levels of other serum enzymes: Secondary | ICD-10-CM | POA: Diagnosis not present

## 2021-03-16 DIAGNOSIS — I69354 Hemiplegia and hemiparesis following cerebral infarction affecting left non-dominant side: Secondary | ICD-10-CM | POA: Insufficient documentation

## 2021-03-16 DIAGNOSIS — R278 Other lack of coordination: Secondary | ICD-10-CM | POA: Diagnosis not present

## 2021-03-16 DIAGNOSIS — D649 Anemia, unspecified: Secondary | ICD-10-CM | POA: Diagnosis not present

## 2021-03-16 DIAGNOSIS — M545 Low back pain, unspecified: Secondary | ICD-10-CM | POA: Insufficient documentation

## 2021-03-16 LAB — CBC WITH DIFFERENTIAL/PLATELET
Basophils Absolute: 0 10*3/uL (ref 0.0–0.1)
Basophils Relative: 0.7 % (ref 0.0–3.0)
Eosinophils Absolute: 0.3 10*3/uL (ref 0.0–0.7)
Eosinophils Relative: 5.2 % — ABNORMAL HIGH (ref 0.0–5.0)
HCT: 35.5 % — ABNORMAL LOW (ref 39.0–52.0)
Hemoglobin: 12 g/dL — ABNORMAL LOW (ref 13.0–17.0)
Lymphocytes Relative: 29.1 % (ref 12.0–46.0)
Lymphs Abs: 1.7 10*3/uL (ref 0.7–4.0)
MCHC: 33.8 g/dL (ref 30.0–36.0)
MCV: 94.7 fl (ref 78.0–100.0)
Monocytes Absolute: 0.8 10*3/uL (ref 0.1–1.0)
Monocytes Relative: 13.5 % — ABNORMAL HIGH (ref 3.0–12.0)
Neutro Abs: 3 10*3/uL (ref 1.4–7.7)
Neutrophils Relative %: 51.5 % (ref 43.0–77.0)
Platelets: 240 10*3/uL (ref 150.0–400.0)
RBC: 3.75 Mil/uL — ABNORMAL LOW (ref 4.22–5.81)
RDW: 13.7 % (ref 11.5–15.5)
WBC: 5.9 10*3/uL (ref 4.0–10.5)

## 2021-03-16 LAB — COMPREHENSIVE METABOLIC PANEL
ALT: 17 U/L (ref 0–53)
AST: 21 U/L (ref 0–37)
Albumin: 4.1 g/dL (ref 3.5–5.2)
Alkaline Phosphatase: 64 U/L (ref 39–117)
BUN: 30 mg/dL — ABNORMAL HIGH (ref 6–23)
CO2: 25 mEq/L (ref 19–32)
Calcium: 9.1 mg/dL (ref 8.4–10.5)
Chloride: 107 mEq/L (ref 96–112)
Creatinine, Ser: 2.02 mg/dL — ABNORMAL HIGH (ref 0.40–1.50)
GFR: 30.98 mL/min — ABNORMAL LOW (ref >60.00)
Glucose, Bld: 94 mg/dL (ref 70–99)
Potassium: 3.9 mEq/L (ref 3.5–5.1)
Sodium: 144 mEq/L (ref 135–145)
Total Bilirubin: 0.4 mg/dL (ref 0.2–1.2)
Total Protein: 7.2 g/dL (ref 6.0–8.3)

## 2021-03-16 LAB — IBC + FERRITIN
Ferritin: 61.3 ng/mL (ref 22.0–322.0)
Iron: 102 ug/dL (ref 42–165)
Saturation Ratios: 26 % (ref 20.0–50.0)
TIBC: 392 ug/dL (ref 250.0–450.0)
Transferrin: 280 mg/dL (ref 212.0–360.0)

## 2021-03-16 NOTE — Therapy (Signed)
[place]. [place], [name], [address] Phone: [phone]   Fax:  [phone]  Physical Therapy Treatment  Patient Details  Name: William [name] MRN: [id number] Date of Birth: 1940/07/26 Referring Provider (PT): Etter Sjogren   Encounter Date: 03/16/2021   PT End of Session - 03/16/21 5885     Visit Number 6    Date for PT Re-Evaluation 05/26/21    PT Start Time 1310    PT Stop Time 0277    PT Time Calculation (min) 43 min    Activity Tolerance Patient tolerated treatment well    Behavior During Therapy [facility] for tasks assessed/performed             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   Mild, related to stroke history   Nocturia    Renal insufficiency 06/25/2013   S/P carotid endarterectomy    BILATERAL ICA--  PATENT PER DUPLEX  05-19-2012   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 2016   cataract surgery both eyes   INGUINAL HERNIA REPAIR Right 11-08-2006   IR KYPHO EA ADDL LEVEL THORACIC OR LUMBAR  02/12/2021   IR RADIOLOGIST EVAL & MGMT  02/18/2021   MASS EXCISION N/A 03/03/2016   Procedure: EXCISION OF BACK  MASS;  Surgeon: Stark Klein, MD;  Location: [place];  Service: General;  Laterality: N/A;   MOHS SURGERY Left 1/ 2016   Dr Nevada Crane-- Basal cell   PROSTATE SURGERY     TRANSURETHRAL RESECTION OF PROSTATE N/A 02/26/2013   Procedure: TRANSURETHRAL RESECTION OF THE PROSTATE WITH GYRUS INSTRUMENTS;  Surgeon: Franchot Gallo, MD;  Location: [facility];  Service: Urology;  Laterality: N/A;    There were no vitals filed for this visit.   Subjective Assessment - 03/16/21 1321     Subjective Feeling pretty good.  I am trying to walk everyday    Currently in Pain? No/denies  [place] Adult PT Treatment/Exercise - 03/16/21 0001       Ambulation/Gait   Gait Comments outside in the grass, negotiating curbs and then on the asphalt, cues for heel strike, longer steps and left arm swing      High Level Balance   High Level Balance Activities Side stepping;Backward walking    High Level Balance Comments ball toss, ball kicks, placing foot in box and bringing it out, did both legs for balance      Lumbar Exercises: Aerobic   Nustep level 5 x 7 minutes      Lumbar Exercises: Standing   Heel Raises 20 reps;1 second    Row Both;20 reps;Theraband    Theraband Level (Row) Level 2 (Red)    Row Limitations needed cues for the left UE    Other Standing Lumbar Exercises hip abduction 2x10 holding cabinet      Lumbar Exercises: Seated   Long Arc Quad on Chair Left;3 sets;10 reps    LAQ on Chair Weights (lbs) 5#     Sit to Stand Limitations sit to stand 5x 3 stes with him holding weighted ball    Other Seated Lumbar Exercises 3# marches left only 2x10                       PT Short Term Goals - 03/04/21 1448       PT SHORT TERM GOAL #1   Title independent with initial HEP    Status On-going               PT Long Term Goals - 03/16/21 1431       PT LONG TERM GOAL #1   Title Understand fall risks, the importance of exercise and flexibility to health and function    Status On-going      PT LONG TERM GOAL #2   Title decrease TUG time to 13 seconds    Status On-going      PT LONG TERM GOAL #3   Title walk around our building without rest     Status On-going      PT LONG TERM GOAL #4   Title decrease pain with walking by 50%    Status On-going                   Plan - 03/16/21 1412     Clinical Impression Statement Patient did better today, less cues for step length, when he was fatigued he started the shuffle and the forward flexed trunk at about 100 feet.  He seemed to be more steady today.  Still significant neglect on the left   Requires a lot of cues to get the end range of TKE on the left and to do full motions on the left UE    PT Next Visit Plan continue to work on function, balance and strength    Consulted and Agree with Plan of Care Patient             Patient will benefit from skilled therapeutic intervention in order to improve the following deficits and impairments:  Abnormal gait, Decreased coordination, Decreased range of motion, Difficulty walking, Increased muscle spasms, Cardiopulmonary status limiting activity, Decreased endurance, Decreased activity tolerance, Pain, Impaired flexibility, Decreased balance, Decreased mobility, Decreased strength, Postural dysfunction  Visit Diagnosis: Hemiplegia and hemiparesis following cerebral infarction affecting left non-dominant side (HCC)  Other lack of coordination  Difficulty in walking,  not elsewhere classified  Muscle weakness (generalized)  Repeated falls  Other abnormalities of gait and mobility  Acute bilateral low back pain without sciatica     Problem List Patient Active Problem List   Diagnosis Date Noted   Closed fracture of first lumbar vertebra with routine healing 02/03/2021   Closed fracture of multiple ribs 11/18/2020   Depression with anxiety 01/29/2020   Leg pain, bilateral 01/29/2020   Ingrown toenail 07/13/2019   Lumbar spondylosis 05/02/2018   Pain in left knee 03/09/2018   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   Trochanteric bursitis of left hip 01/16/2018   Preventative health care 09/26/2017   HTN (hypertension) 07/19/2015   Hyperlipidemia 07/19/2015   Great toe pain 02/11/2014   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 [place], [name], [address] Phone: [phone]   Fax:  [phone]  Name: CINCERE ZORN MRN: [id number] Date of Birth: 11-29-1940

## 2021-03-17 ENCOUNTER — Encounter: Payer: Self-pay | Admitting: Family Medicine

## 2021-03-18 ENCOUNTER — Other Ambulatory Visit: Payer: Self-pay

## 2021-03-18 ENCOUNTER — Encounter: Payer: Self-pay | Admitting: Physical Therapy

## 2021-03-18 ENCOUNTER — Ambulatory Visit: Payer: PPO | Admitting: Physical Therapy

## 2021-03-18 DIAGNOSIS — M6281 Muscle weakness (generalized): Secondary | ICD-10-CM

## 2021-03-18 DIAGNOSIS — I69354 Hemiplegia and hemiparesis following cerebral infarction affecting left non-dominant side: Secondary | ICD-10-CM

## 2021-03-18 DIAGNOSIS — R262 Difficulty in walking, not elsewhere classified: Secondary | ICD-10-CM

## 2021-03-18 DIAGNOSIS — R278 Other lack of coordination: Secondary | ICD-10-CM

## 2021-03-18 DIAGNOSIS — M47816 Spondylosis without myelopathy or radiculopathy, lumbar region: Secondary | ICD-10-CM | POA: Diagnosis not present

## 2021-03-18 DIAGNOSIS — M545 Low back pain, unspecified: Secondary | ICD-10-CM

## 2021-03-18 DIAGNOSIS — R296 Repeated falls: Secondary | ICD-10-CM

## 2021-03-18 DIAGNOSIS — R2689 Other abnormalities of gait and mobility: Secondary | ICD-10-CM

## 2021-03-18 MED FILL — Amlodipine Besylate Tab 10 MG (Base Equivalent): ORAL | 90 days supply | Qty: 90 | Fill #2 | Status: AC

## 2021-03-18 NOTE — Therapy (Signed)
[place]. [place], [name], [address] Phone: [phone]   Fax:  [phone]  Physical Therapy Treatment  Patient Details  Name: William [name] MRN: [id number] Date of Birth: 1941-01-24 Referring Provider (PT): Etter Sjogren   Encounter Date: 03/18/2021   PT End of Session - 03/18/21 1355     Visit Number 7    Date for PT Re-Evaluation 05/26/21    PT Start Time 1310    PT Stop Time 0272    PT Time Calculation (min) 48 min    Activity Tolerance Patient tolerated treatment well    Behavior During Therapy [facility] for tasks assessed/performed             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   Mild, related to stroke history   Nocturia    Renal insufficiency 06/25/2013   S/P carotid endarterectomy    BILATERAL ICA--  PATENT PER DUPLEX  05-19-2012   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   Right calf   Urinary frequency    Vitamin D deficiency     Past Surgical History:  Procedure Laterality Date   APPENDECTOMY  AS CHILD   CARDIOVASCULAR STRESS TEST  03-27-2012  DR CRENSHAW   LOW RISK LEXISCAN STUDY-- PROBABLE NORMAL PERFUSION AND SOFT TISSUE ATTENUATION/  NO ISCHEMIA/ EF 51%   CAROTID ENDARTERECTOMY Bilateral LEFT  11-12-2008  DR GREG HAYES   RIGHT ICA  2006  ([facility])   CYSTOSCOPY WITH LITHOLAPAXY  N/A 02/26/2013   Procedure: CYSTOSCOPY WITH LITHOLAPAXY;  Surgeon: Franchot Gallo, MD;  Location: [facility];  Service: Urology;  Laterality: N/A;   EYE SURGERY  Jan. 2016   cataract surgery both eyes   INGUINAL HERNIA REPAIR Right 11-08-2006   IR KYPHO EA ADDL LEVEL THORACIC OR LUMBAR  02/12/2021   IR RADIOLOGIST EVAL & MGMT  02/18/2021   MASS EXCISION N/A 03/03/2016   Procedure: EXCISION OF BACK  MASS;  Surgeon: Stark Klein, MD;  Location: [place];  Service: General;  Laterality: N/A;   MOHS SURGERY Left 1/ 2016   Dr Nevada Crane-- Basal cell   PROSTATE SURGERY     TRANSURETHRAL RESECTION OF PROSTATE N/A 02/26/2013   Procedure: TRANSURETHRAL RESECTION OF THE PROSTATE WITH GYRUS INSTRUMENTS;  Surgeon: Franchot Gallo, MD;  [facility];  Service: Urology;  Laterality: N/A;    There were no vitals filed for this visit.   Subjective Assessment - 03/18/21 1317     Subjective I will have an injection in the future.  I have tried to walk some but my back hurts, my legs get really tired    Currently in Pain? No/denies  [place] Adult PT Treatment/Exercise - 03/18/21 0001       Ambulation/Gait   Gait Comments outside in the grass, negotiating curbs and then on the asphalt, cues for heel strike, longer steps and left arm swing, did some walking with SPC and him carrying 3# and then carrying a foot ball      High Level Balance   High Level Balance Activities Side stepping;Backward walking    High Level Balance Comments ball toss, ball kicks, placing foot in box and bringing it out, did both legs for balance, in pbars fwd and backward walking, on ariex tandem and side stepping, cone touches with feet using a SPC      Lumbar Exercises: Aerobic   Nustep level 5 x 7 minutes      Lumbar Exercises: Standing   Heel Raises 20 reps;1 second    Other Standing Lumbar Exercises marching 5#, hip  abduction 5#    Other Standing Lumbar Exercises toe raises holding on      Lumbar Exercises: Seated   Long Arc Quad on Chair Left;3 sets;10 reps    LAQ on Chair Weights (lbs) 5#    Other Seated Lumbar Exercises left arm red tband    Other Seated Lumbar Exercises green tband HS curls, green tband left arm row, a lot of cues for the left arm, left arm extension                       PT Short Term Goals - 03/04/21 1448       PT SHORT TERM GOAL #1   Title independent with initial HEP    Status On-going               PT Long Term Goals - 03/18/21 1401       PT LONG TERM GOAL #1   Title Understand fall risks, the importance of exercise and flexibility to health and function    Status Partially Met      PT LONG TERM GOAL #2   Title decrease TUG time to 13 seconds    Status On-going      PT LONG TERM GOAL #3   Title walk around our building without rest     Status On-going                   Plan - 03/18/21 1400     Clinical Impression Statement Patient continues to do well, we did more walking today in the clinic with a SPC and then we walked outside 2x.  He tolerated this well only fatigue with left foot shuffle when he got tired.  Still very fearful of any dynamic surface type standing or walking    PT Next Visit Plan continue to work on function, balance and strength    Consulted and Agree with Plan of Care Patient             Patient will benefit from skilled therapeutic intervention in order to improve the following deficits and impairments:  Abnormal gait, Decreased coordination, Decreased range of motion, Difficulty walking, Increased muscle spasms, Cardiopulmonary status limiting activity, Decreased endurance, Decreased activity tolerance, Pain, Impaired flexibility, Decreased balance, Decreased mobility, Decreased strength, Postural dysfunction  Visit Diagnosis: Hemiplegia and hemiparesis following cerebral infarction affecting left  non-dominant side (HCC)  Other lack of coordination  Difficulty in walking, not elsewhere classified  Muscle weakness (generalized)  Repeated falls  Other abnormalities of gait and mobility  Acute bilateral low  back pain without sciatica     Problem List Patient Active Problem List   Diagnosis Date Noted   Closed fracture of first lumbar vertebra with routine healing 02/03/2021   Closed fracture of multiple ribs 11/18/2020   Depression with anxiety 01/29/2020   Leg pain, bilateral 01/29/2020   Ingrown toenail 07/13/2019   Lumbar spondylosis 05/02/2018   Pain in left knee 03/09/2018   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   Trochanteric bursitis of left hip 01/16/2018   Preventative health care 09/26/2017   HTN (hypertension) 07/19/2015   Hyperlipidemia 07/19/2015   Great toe pain 02/11/2014   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 [place], [name], [address] Phone: [phone]   Fax:  [phone]  Name: William [name] MRN: [id number] Date of Birth: May 11, 1941

## 2021-03-19 ENCOUNTER — Other Ambulatory Visit (HOSPITAL_BASED_OUTPATIENT_CLINIC_OR_DEPARTMENT_OTHER): Payer: Self-pay

## 2021-03-23 ENCOUNTER — Other Ambulatory Visit: Payer: Self-pay

## 2021-03-23 ENCOUNTER — Ambulatory Visit: Payer: PPO | Admitting: Physical Therapy

## 2021-03-23 ENCOUNTER — Encounter: Payer: Self-pay | Admitting: Physical Therapy

## 2021-03-23 DIAGNOSIS — R278 Other lack of coordination: Secondary | ICD-10-CM

## 2021-03-23 DIAGNOSIS — I69354 Hemiplegia and hemiparesis following cerebral infarction affecting left non-dominant side: Secondary | ICD-10-CM

## 2021-03-23 DIAGNOSIS — R262 Difficulty in walking, not elsewhere classified: Secondary | ICD-10-CM

## 2021-03-23 DIAGNOSIS — R2689 Other abnormalities of gait and mobility: Secondary | ICD-10-CM

## 2021-03-23 DIAGNOSIS — M6281 Muscle weakness (generalized): Secondary | ICD-10-CM

## 2021-03-23 DIAGNOSIS — M545 Low back pain, unspecified: Secondary | ICD-10-CM

## 2021-03-23 DIAGNOSIS — R296 Repeated falls: Secondary | ICD-10-CM

## 2021-03-23 NOTE — Therapy (Signed)
[place]. [place], [name], [address] Phone: [phone]   Fax:  [phone]  Physical Therapy Treatment  Patient Details  Name: William [name] MRN: [id number] Date of Birth: [birth year] Referring Provider (PT): Etter Sjogren   Encounter Date: 03/23/2021   PT End of Session - 03/23/21 1406     Visit Number 8    Date for PT Re-Evaluation 05/26/21    PT Start Time 5056    PT Stop Time 1355    PT Time Calculation (min) 47 min    Activity Tolerance Patient tolerated treatment well    Behavior During Therapy [facility] for tasks assessed/performed             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   Mild, related to stroke history   Nocturia    Renal insufficiency 06/25/2013   S/P carotid endarterectomy    BILATERAL ICA--  PATENT PER DUPLEX  05-19-2012   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;  Service: Urology;  Laterality: N/A;   EYE SURGERY  Jan. 2016   cataract surgery both eyes   INGUINAL HERNIA REPAIR Right 11-08-2006   IR KYPHO EA ADDL LEVEL THORACIC OR LUMBAR  02/12/2021   IR RADIOLOGIST EVAL & MGMT  02/18/2021   MASS EXCISION N/A 03/03/2016   Procedure: EXCISION OF BACK  MASS;  Surgeon: Stark Klein, MD;  Location: [place];  Service: General;  Laterality: N/A;   MOHS SURGERY Left 1/ 2016   Dr Nevada Crane-- Basal cell   PROSTATE SURGERY     TRANSURETHRAL RESECTION OF PROSTATE N/A 02/26/2013   Procedure: TRANSURETHRAL RESECTION OF THE PROSTATE WITH GYRUS INSTRUMENTS;  Surgeon: Franchot Gallo, MD;  Location[facility];  Service: Urology;  Laterality: N/A;    There were no vitals filed for this visit.   Subjective Assessment - 03/23/21 1320     Subjective I will have injeciton 10/31.  doing okay, I tried to walk the dog this morning, very tired    Currently in Pain? No/denies  [place] Adult PT Treatment/Exercise - 03/23/21 0001       Ambulation/Gait   Gait Comments fast walking HHA, walking outside to the car from the back of the buiolding, a lit of cues needed for arm swing and step length, heel strike      High Level Balance   High Level Balance Activities Side stepping;Backward walking    High Level Balance Comments on airex ball toss, cone toe touches with SPC, high knee marches with SPC in place      Lumbar Exercises: Aerobic   UBE (Upper Arm Bike) Level 3 x 6 minutes    Nustep level 5 x 7 minutes      Lumbar Exercises: Machines for Strengthening   Other Lumbar Machine Exercise lats and seated row focus on the left 15# 2x10      Lumbar Exercises: Standing   Heel Raises 20 reps;1 second    Other Standing Lumbar Exercises hip abduction 3#    Other Standing  Lumbar Exercises toe raises holding on      Lumbar Exercises: Seated   Long Arc Quad on Chair Left;3 sets;10 reps    LAQ on Chair Weights (lbs) 5#    Other Seated Lumbar Exercises 5# marches                       PT Short Term Goals - 03/04/21 1448       PT SHORT TERM GOAL #1   Title independent with initial HEP    Status On-going               PT Long Term Goals - 03/18/21 1401       PT LONG TERM GOAL #1   Title Understand fall risks, the importance of exercise and flexibility to health and function    Status Partially Met      PT LONG TERM GOAL #2   Title decrease TUG time to 13 seconds    Status On-going      PT LONG TERM GOAL #3   Title walk around our building without rest     Status On-going                   Plan - 03/23/21 1406     Clinical Impression Statement Patient a little more fatigued and dragging the left leg some, reports that he took the dog for a walk today , needs a lot of cues for the heel strike step length, tends to have the left knee in some flexion.  Wants to have some hold on walls for stability    PT Next Visit Plan continue to work on function, balance and strength    Consulted and Agree with Plan of Care Patient             Patient will benefit from skilled therapeutic intervention in order to improve the following deficits and impairments:  Abnormal gait, Decreased coordination, Decreased range of motion, Difficulty walking, Increased muscle spasms, Cardiopulmonary status limiting activity, Decreased endurance, Decreased activity tolerance, Pain, Impaired flexibility, Decreased balance, Decreased mobility, Decreased strength, Postural dysfunction  Visit Diagnosis: Hemiplegia and hemiparesis following cerebral infarction affecting left non-dominant side (HCC)  Other lack of coordination  Difficulty in walking, not elsewhere classified  Muscle weakness (generalized)  Repeated falls  Other abnormalities  of gait and mobility  Acute bilateral low back pain without sciatica     Problem List Patient Active Problem List   Diagnosis Date Noted  Closed fracture of first lumbar vertebra with routine healing 02/03/2021   Closed fracture of multiple ribs 11/18/2020   Depression with anxiety 01/29/2020   Leg pain, bilateral 01/29/2020   Ingrown toenail 07/13/2019   Lumbar spondylosis 05/02/2018   Pain in left knee 03/09/2018   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   Trochanteric bursitis of left hip 01/16/2018   Preventative health care 09/26/2017   HTN (hypertension) 07/19/2015   Hyperlipidemia 07/19/2015   Great toe pain 02/11/2014   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 [place], [name], [address] Phone: [phone]   Fax:  [phone]  Name: PATRIC BUCKHALTER MRN: [id number] Date of Birth: 11-17-1940

## 2021-03-24 ENCOUNTER — Other Ambulatory Visit (HOSPITAL_BASED_OUTPATIENT_CLINIC_OR_DEPARTMENT_OTHER): Payer: Self-pay

## 2021-03-24 MED ORDER — COVID-19MRNA BIVAL VACC PFIZER 30 MCG/0.3ML IM SUSP
INTRAMUSCULAR | 0 refills | Status: DC
  Filled 2021-03-24: qty 0.3, 1d supply, fill #0

## 2021-03-25 ENCOUNTER — Ambulatory Visit: Payer: PPO | Admitting: Physical Therapy

## 2021-03-25 ENCOUNTER — Other Ambulatory Visit: Payer: Self-pay

## 2021-03-25 ENCOUNTER — Encounter: Payer: Self-pay | Admitting: Physical Therapy

## 2021-03-25 DIAGNOSIS — R262 Difficulty in walking, not elsewhere classified: Secondary | ICD-10-CM

## 2021-03-25 DIAGNOSIS — R2689 Other abnormalities of gait and mobility: Secondary | ICD-10-CM

## 2021-03-25 DIAGNOSIS — I69354 Hemiplegia and hemiparesis following cerebral infarction affecting left non-dominant side: Secondary | ICD-10-CM | POA: Diagnosis not present

## 2021-03-25 DIAGNOSIS — R296 Repeated falls: Secondary | ICD-10-CM

## 2021-03-25 DIAGNOSIS — M6281 Muscle weakness (generalized): Secondary | ICD-10-CM

## 2021-03-25 DIAGNOSIS — R278 Other lack of coordination: Secondary | ICD-10-CM

## 2021-03-25 DIAGNOSIS — M545 Low back pain, unspecified: Secondary | ICD-10-CM

## 2021-03-25 NOTE — Therapy (Signed)
[place]. [place], [name], [address] Phone: [phone]   Fax:  [phone]  Physical Therapy Treatment  Patient Details  Name: William [name] MRN: [id number] Date of Birth: 1941/02/07 Referring Provider (PT): Etter Sjogren   Encounter Date: 03/25/2021   PT End of Session - 03/25/21 1440     Visit Number 9    Date for PT Re-Evaluation 05/26/21    PT Start Time 1308    PT Stop Time 1352    PT Time Calculation (min) 44 min    Activity Tolerance Patient tolerated treatment well    Behavior During Therapy [facility] for tasks assessed/performed             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   Mild, related to stroke history   Nocturia    Renal insufficiency 06/25/2013   S/P carotid endarterectomy    BILATERAL ICA--  PATENT PER DUPLEX  05-19-2012   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 2016   cataract surgery both eyes   INGUINAL HERNIA REPAIR Right 11-08-2006   IR KYPHO EA ADDL LEVEL THORACIC OR LUMBAR  02/12/2021   IR RADIOLOGIST EVAL & MGMT  02/18/2021   MASS EXCISION N/A 03/03/2016   Procedure: EXCISION OF BACK  MASS;  Surgeon: Stark Klein, MD;  Location: [place];  Service: General;  Laterality: N/A;   MOHS SURGERY Left 1/ 2016   Dr Nevada Crane-- Basal cell   PROSTATE SURGERY     TRANSURETHRAL RESECTION OF PROSTATE N/A 02/26/2013   Procedure: TRANSURETHRAL RESECTION OF THE PROSTATE WITH GYRUS INSTRUMENTS;  Surgeon: Franchot Gallo, MD;  Location: [facility];  Service: Urology;  Laterality: N/A;    There were no vitals filed for this visit.   Subjective Assessment - 03/25/21 1316     Subjective I am tired. I have been trying to walk the dog, reports very tired and egs get weak    Currently in Pain? Yes    Pain Score 2     Pain Location Back    Pain Orientation Lower  Pain Descriptors / Indicators Aching;Sore    Aggravating Factors  walking                               [facility] Adult PT Treatment/Exercise - 03/25/21 0001       Transfers   Comments worked on getting up from floor but really broke it down did wall pushups to assure he can put weight a=ont he arms and then kneel onto bosu and hands on table and pushing up did this a total of 6 x 2 rest, 2 rest and 2      Ambulation/Gait   Gait Comments walking in clinic 2x100' heel stike cues did well with this cues for arm swing, not very well with this      High Level Balance   High Level Balance Activities Side stepping;Backward walking      Lumbar Exercises: Aerobic   UBE (Upper Arm Bike) Level 3 x 6 minutes    Nustep level 5 x 7 minutes      Lumbar Exercises: Machines for  Strengthening   Cybex Knee Extension 5# 2x10                       PT Short Term Goals - 03/04/21 1448       PT SHORT TERM GOAL #1   Title independent with initial HEP    Status On-going               PT Long Term Goals - 03/25/21 1443       PT LONG TERM GOAL #2   Title decrease TUG time to 13 seconds    Status Partially Met                   Plan - 03/25/21 1440     Clinical Impression Statement Patient wanted to try getting up from floor, this will need to be broken down.  We started with him trying to knee and get up from a bosu ball, he has very weak left arm and the push up is tough.  The balance from this position to upright is very tough on him.  The heel strike was better today, the arm swing was absent    PT Next Visit Plan continue to work on function, balance and strength    Consulted and Agree with Plan of Care Patient             Patient will benefit from skilled therapeutic intervention in order to improve the following deficits and impairments:  Abnormal gait, Decreased coordination, Decreased range of motion, Difficulty walking, Increased muscle spasms, Cardiopulmonary status limiting activity, Decreased endurance, Decreased activity tolerance, Pain, Impaired flexibility, Decreased balance, Decreased mobility, Decreased strength, Postural dysfunction  Visit Diagnosis: Hemiplegia and hemiparesis following cerebral infarction affecting left non-dominant side (HCC)  Other lack of coordination  Difficulty in walking, not elsewhere classified  Muscle weakness (generalized)  Repeated falls  Other abnormalities of gait and mobility  Acute bilateral low back pain without sciatica     Problem List Patient Active Problem List   Diagnosis Date Noted   Closed fracture of first lumbar vertebra with routine healing 02/03/2021   Closed fracture of multiple ribs 11/18/2020   Depression with anxiety 01/29/2020   Leg pain,  bilateral 01/29/2020   Ingrown toenail 07/13/2019   Lumbar spondylosis 05/02/2018   Pain in left knee 03/09/2018   Osteoarthritis of  left hip 01/16/2018   Trochanteric bursitis of left hip 01/16/2018   Preventative health care 09/26/2017   HTN (hypertension) 07/19/2015   Hyperlipidemia 07/19/2015   Great toe pain 02/11/2014   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 [place], [name], [address] Phone: [phone]   Fax:  [phone]  Name: JANCARLOS THRUN MRN: [id number] Date of Birth: 1940-10-01

## 2021-03-30 ENCOUNTER — Encounter: Payer: Self-pay | Admitting: Physical Therapy

## 2021-03-30 ENCOUNTER — Ambulatory Visit: Payer: PPO | Admitting: Physical Therapy

## 2021-03-30 ENCOUNTER — Other Ambulatory Visit: Payer: Self-pay

## 2021-03-30 DIAGNOSIS — M6281 Muscle weakness (generalized): Secondary | ICD-10-CM

## 2021-03-30 DIAGNOSIS — R296 Repeated falls: Secondary | ICD-10-CM

## 2021-03-30 DIAGNOSIS — M545 Low back pain, unspecified: Secondary | ICD-10-CM

## 2021-03-30 DIAGNOSIS — I69354 Hemiplegia and hemiparesis following cerebral infarction affecting left non-dominant side: Secondary | ICD-10-CM | POA: Diagnosis not present

## 2021-03-30 DIAGNOSIS — R278 Other lack of coordination: Secondary | ICD-10-CM

## 2021-03-30 DIAGNOSIS — R2689 Other abnormalities of gait and mobility: Secondary | ICD-10-CM

## 2021-03-30 DIAGNOSIS — R262 Difficulty in walking, not elsewhere classified: Secondary | ICD-10-CM

## 2021-03-30 NOTE — Therapy (Signed)
[place]. [place], [name], [address] Phone: [phone]   Fax:  [phone] Progress Note Reporting Period 02/24/21 to 03/30/21 for visit 1-10  See note below for Objective Data and Assessment of Progress/Goals.     Physical Therapy Treatment  Patient Details  Name: William [name] MRN: [id number] Date of Birth: [birth year] Referring Provider (PT): Etter Sjogren   Encounter Date: 03/30/2021   PT End of Session - 03/30/21 1527     Visit Number 10    Date for PT Re-Evaluation 05/26/21    PT Start Time 1308    PT Stop Time 2094    PT Time Calculation (min) 45 min    Activity Tolerance Patient tolerated treatment well    Behavior During Therapy [facility] for tasks assessed/performed             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   Mild, related to stroke history   Nocturia    Renal insufficiency 06/25/2013   S/P carotid endarterectomy    BILATERAL ICA--  PATENT PER DUPLEX  05-19-2012   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   Right calf   Urinary frequency    Vitamin D deficiency     Past Surgical History:  Procedure Laterality Date   APPENDECTOMY  AS CHILD   CARDIOVASCULAR STRESS TEST  03-27-2012  DR CRENSHAW   LOW RISK LEXISCAN STUDY-- PROBABLE NORMAL PERFUSION AND SOFT TISSUE ATTENUATION/  NO  ISCHEMIA/ EF 51%   CAROTID ENDARTERECTOMY Bilateral LEFT  11-12-2008  DR GREG HAYES   RIGHT ICA  2006  ([facility])   CYSTOSCOPY WITH LITHOLAPAXY N/A 02/26/2013   Procedure: CYSTOSCOPY WITH LITHOLAPAXY;  Surgeon: Franchot Gallo, MD;  Location: [facility];  Service: Urology;  Laterality: N/A;   EYE SURGERY  Jan. 2016   cataract surgery both eyes   INGUINAL HERNIA REPAIR Right 11-08-2006   IR KYPHO EA ADDL LEVEL THORACIC OR LUMBAR  02/12/2021   IR RADIOLOGIST EVAL & MGMT  02/18/2021   MASS EXCISION N/A 03/03/2016   Procedure: EXCISION OF BACK  MASS;  Surgeon: Stark Klein, MD;  Location: [place];  Service: General;  Laterality: N/A;   MOHS SURGERY Left 1/ 2016   Dr Nevada Crane-- Basal cell   PROSTATE SURGERY     TRANSURETHRAL RESECTION OF PROSTATE N/A 02/26/2013   Procedure: TRANSURETHRAL RESECTION OF THE PROSTATE WITH GYRUS INSTRUMENTS;  Surgeon: Franchot Gallo, MD;  Location: [facility];  Service: Urology;  Laterality: N/A;    There were no vitals filed for this visit.   Subjective Assessment - 03/30/21 1316     Subjective Patient reports that when he walks the dog his back hurts and he is fatigued, reports his ribs hurt today  from sneezing, had fractured ribs about 2 months ago    Currently in Pain? Yes    Pain Location Rib cage    Pain Orientation Left    Pain Descriptors / Indicators Sore    Aggravating Factors  sneezing                               [facility] Adult PT Treatment/Exercise - 03/30/21 0001       Ambulation/Gait   Gait Comments outside negotiate curb and then walk from back door to the front cues for step length and arm swing      High Level Balance   High Level Balance Activities Side stepping;Negotiating over obstacles    High Level Balance Comments standing ball toss, standing on airex left tband row, sit to stand on airex, stepping over obstacles forward and side to side      Lumbar Exercises: Aerobic    Nustep level 5 x 7 minutes      Lumbar Exercises: Machines for Strengthening   Cybex Knee Extension 5# 2x10    Cybex Knee Flexion 20# 2x10, 20# left only 2x5 reps    Other Lumbar Machine Exercise lats and seated row focus on the left 15# 2x10    Other Lumbar Machine Exercise 20# resisted gait fwd and bkwd      Lumbar Exercises: Standing   Heel Raises 20 reps;1 second    Other Standing Lumbar Exercises hip abduction, marching and extension 3#    Other Standing Lumbar Exercises toe raises holding on to walker      Lumbar Exercises: Seated   Other Seated Lumbar Exercises 5# marches    Other Seated Lumbar Exercises left DF 3x10 focus ont he movement                       PT Short Term Goals - 03/04/21 1448       PT SHORT TERM GOAL #1   Title independent with initial HEP    Status On-going               PT Long Term Goals - 03/25/21 1443       PT LONG TERM GOAL #2   Title decrease TUG time to 13 seconds    Status Partially Met                   Plan - 03/30/21 1527     Clinical Impression Statement Patient with some rib pain today from sneezing, he tends to really demonstrate neglect of the left UE and LE, no arms swing, left leg stays bent and no heel strike.  Verbal cues help only small time and then reverts back    PT Next Visit Plan continue to work on function, balance and strength, try getting up from floor    Consulted and Agree with Plan of Care Patient             Patient will benefit from skilled therapeutic intervention in order to improve the following deficits and impairments:  Abnormal gait, Decreased coordination, Decreased range of motion, Difficulty walking, Increased muscle spasms, Cardiopulmonary status limiting activity, Decreased endurance, Decreased activity tolerance, Pain, Impaired flexibility, Decreased balance, Decreased mobility, Decreased strength, Postural dysfunction  Visit Diagnosis: Hemiplegia and hemiparesis  following cerebral infarction affecting left non-dominant side (HCC)  Other lack of coordination  Difficulty in walking, not elsewhere classified  Muscle weakness (generalized)  Repeated falls  Other abnormalities of gait and mobility  Acute bilateral low back pain without sciatica     Problem List Patient Active Problem List   Diagnosis Date Noted   Closed fracture of first lumbar vertebra with routine healing 02/03/2021   Closed fracture of multiple ribs 11/18/2020   Depression with anxiety 01/29/2020   Leg pain, bilateral 01/29/2020   Ingrown toenail 07/13/2019   Lumbar spondylosis 05/02/2018   Pain in left knee 03/09/2018   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   Trochanteric bursitis of left hip 01/16/2018   Preventative health care 09/26/2017   HTN (hypertension) 07/19/2015   Hyperlipidemia 07/19/2015   Great toe pain 02/11/2014   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 [place], [name], [address] Phone: [phone]   Fax:  [phone]  Name: William [name] MRN: [id number] Date of Birth: 22-Aug-1940

## 2021-04-01 ENCOUNTER — Other Ambulatory Visit: Payer: Self-pay

## 2021-04-01 ENCOUNTER — Ambulatory Visit: Payer: PPO | Admitting: Physical Therapy

## 2021-04-01 ENCOUNTER — Other Ambulatory Visit (HOSPITAL_BASED_OUTPATIENT_CLINIC_OR_DEPARTMENT_OTHER): Payer: Self-pay

## 2021-04-01 ENCOUNTER — Encounter: Payer: Self-pay | Admitting: Physical Therapy

## 2021-04-01 DIAGNOSIS — R278 Other lack of coordination: Secondary | ICD-10-CM

## 2021-04-01 DIAGNOSIS — M6281 Muscle weakness (generalized): Secondary | ICD-10-CM

## 2021-04-01 DIAGNOSIS — R262 Difficulty in walking, not elsewhere classified: Secondary | ICD-10-CM

## 2021-04-01 DIAGNOSIS — I69354 Hemiplegia and hemiparesis following cerebral infarction affecting left non-dominant side: Secondary | ICD-10-CM

## 2021-04-01 DIAGNOSIS — M545 Low back pain, unspecified: Secondary | ICD-10-CM

## 2021-04-01 DIAGNOSIS — R296 Repeated falls: Secondary | ICD-10-CM

## 2021-04-01 DIAGNOSIS — R2689 Other abnormalities of gait and mobility: Secondary | ICD-10-CM

## 2021-04-01 NOTE — Therapy (Signed)
[facility] [address] [place], [place], [address] Phone: [phone]   Fax:  [phone]  Physical Therapy Treatment  Patient Details  Name: William [name] MRN: [id number] Date of Birth: February 10, 1941 Referring Provider (PT): Laury Axon   Encounter Date: 04/01/2021   PT End of Session - 04/01/21 1512     Visit Number 11    Date for PT Re-Evaluation 05/26/21    PT Start Time 1328    PT Stop Time 1400    PT Time Calculation (min) 32 min    Activity Tolerance Patient tolerated treatment well    Behavior During Therapy [facility] for tasks assessed/performed             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   Mild, related to stroke history   Nocturia    Renal insufficiency 06/25/2013   S/P carotid endarterectomy    BILATERAL ICA--  PATENT PER DUPLEX  05-19-2012   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   Right calf   Urinary frequency    Vitamin D deficiency     Past Surgical History:  Procedure Laterality Date   APPENDECTOMY  AS CHILD   CARDIOVASCULAR STRESS TEST  03-27-2012  DR CRENSHAW   LOW RISK LEXISCAN STUDY-- PROBABLE NORMAL PERFUSION AND SOFT TISSUE ATTENUATION/  NO ISCHEMIA/ EF 51%   CAROTID ENDARTERECTOMY Bilateral LEFT  11-12-2008  DR GREG HAYES   RIGHT ICA  2006  ([facility])   CYSTOSCOPY WITH LITHOLAPAXY  N/A 02/26/2013   Procedure: CYSTOSCOPY WITH LITHOLAPAXY;  Surgeon: Marcine Matar, MD;  Location: [facility];  Service: Urology;  Laterality: N/A;   EYE SURGERY  Jan. 2016   cataract surgery both eyes   INGUINAL HERNIA REPAIR Right 11-08-2006   IR KYPHO EA ADDL LEVEL THORACIC OR LUMBAR  02/12/2021   IR RADIOLOGIST EVAL & MGMT  02/18/2021   MASS EXCISION N/A 03/03/2016   Procedure: EXCISION OF BACK  MASS;  Surgeon: Almond Lint, MD;  Location: [facility];  Service: General;  Laterality: N/A;   MOHS SURGERY Left 1/ 2016   Dr Margo Aye-- Basal cell   PROSTATE SURGERY     TRANSURETHRAL RESECTION OF PROSTATE N/A 02/26/2013   Procedure: TRANSURETHRAL RESECTION OF THE PROSTATE WITH GYRUS INSTRUMENTS;  Surgeon: Marcine Matar, MD;  Location: [facility];  Service: Urology;  Laterality: N/A;    There were no vitals filed for this visit.   Subjective Assessment - 04/01/21 1336     Subjective Patient late due to having to take dog to the vet, reports back is hurting today    Currently in Pain? Yes    Pain Score 4     Pain Location Back    Pain Orientation Lower;Left  Pain Descriptors / Indicators Sore;Aching    Aggravating Factors  woke up hurting                               [facility] Adult PT Treatment/Exercise - 04/01/21 0001       Transfers   Comments working on getting up from the floor, used bosu to kneel on and then use hands on table and push up      High Level Balance   High Level Balance Comments on airex ball toss, airex balance beam in the Pbars tandem walk and side stepping      Lumbar Exercises: Aerobic   Nustep level 5 x 4 minutes      Lumbar Exercises: Machines for Strengthening   Other Lumbar Machine Exercise lats and seated row focus on the left 15# 2x10      Lumbar Exercises: Standing   Heel Raises 20 reps;1 second    Other Standing Lumbar Exercises hip abduction, marching and extension 3#    Other  Standing Lumbar Exercises toe raises holding on to walker      Lumbar Exercises: Seated   Long Arc Quad on Chair Left;3 sets;10 reps    LAQ on Chair Weights (lbs) 3    Other Seated Lumbar Exercises 3# marches, 3# left arm reaches    Other Seated Lumbar Exercises left DF 3x10 focus ont he movement                       PT Short Term Goals - 03/04/21 1448       PT SHORT TERM GOAL #1   Title independent with initial HEP    Status On-going               PT Long Term Goals - 04/01/21 1528       PT LONG TERM GOAL #1   Title Understand fall risks, the importance of exercise and flexibility to health and function    Status Partially Met      PT LONG TERM GOAL #2   Title decrease TUG time to 13 seconds    Status Partially Met                   Plan - 04/01/21 1526     Clinical Impression Statement Patient running around today and a little late, his back is hurting a little today.  He has done well lately with less balance issues, we are starting to work on getting up from the floor.    PT Next Visit Plan continue to work on function, balance and strength, try getting up from floor    Consulted and Agree with Plan of Care Patient             Patient will benefit from skilled therapeutic intervention in order to improve the following deficits and impairments:  Abnormal gait, Decreased coordination, Decreased range of motion, Difficulty walking, Increased muscle spasms, Cardiopulmonary status limiting activity, Decreased endurance, Decreased activity tolerance, Pain, Impaired flexibility, Decreased balance, Decreased mobility, Decreased strength, Postural dysfunction  Visit Diagnosis: Hemiplegia and hemiparesis following cerebral infarction affecting left non-dominant side (HCC)  Other lack of coordination  Difficulty in walking, not elsewhere classified  Muscle weakness (generalized)  Repeated falls  Other abnormalities of gait and  mobility  Acute bilateral low back pain without sciatica     Problem List Patient Active Problem List   Diagnosis  Date Noted   Closed fracture of first lumbar vertebra with routine healing 02/03/2021   Closed fracture of multiple ribs 11/18/2020   Depression with anxiety 01/29/2020   Leg pain, bilateral 01/29/2020   Ingrown toenail 07/13/2019   Lumbar spondylosis 05/02/2018   Pain in left knee 03/09/2018   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   Trochanteric bursitis of left hip 01/16/2018   Preventative health care 09/26/2017   HTN (hypertension) 07/19/2015   Hyperlipidemia 07/19/2015   Great toe pain 02/11/2014   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 [place], [name], [address] Phone: [phone]   Fax:  [phone]  Name: William [name] MRN: [id number] Date of Birth: [birth year]

## 2021-04-02 ENCOUNTER — Other Ambulatory Visit (HOSPITAL_BASED_OUTPATIENT_CLINIC_OR_DEPARTMENT_OTHER): Payer: Self-pay

## 2021-04-02 MED ORDER — LISINOPRIL-HYDROCHLOROTHIAZIDE 10-12.5 MG PO TABS
1.0000 | ORAL_TABLET | Freq: Every day | ORAL | 3 refills | Status: DC
  Filled 2021-04-02: qty 30, 30d supply, fill #0
  Filled 2021-04-29: qty 30, 30d supply, fill #1
  Filled 2021-05-31: qty 30, 30d supply, fill #2
  Filled 2021-06-29: qty 30, 30d supply, fill #3

## 2021-04-03 ENCOUNTER — Other Ambulatory Visit (HOSPITAL_BASED_OUTPATIENT_CLINIC_OR_DEPARTMENT_OTHER): Payer: Self-pay

## 2021-04-06 ENCOUNTER — Other Ambulatory Visit: Payer: PPO

## 2021-04-06 ENCOUNTER — Ambulatory Visit: Payer: PPO | Admitting: Physical Therapy

## 2021-04-06 ENCOUNTER — Encounter: Payer: Self-pay | Admitting: Physical Therapy

## 2021-04-06 ENCOUNTER — Other Ambulatory Visit: Payer: Self-pay

## 2021-04-06 DIAGNOSIS — R262 Difficulty in walking, not elsewhere classified: Secondary | ICD-10-CM

## 2021-04-06 DIAGNOSIS — R296 Repeated falls: Secondary | ICD-10-CM

## 2021-04-06 DIAGNOSIS — M545 Low back pain, unspecified: Secondary | ICD-10-CM

## 2021-04-06 DIAGNOSIS — R278 Other lack of coordination: Secondary | ICD-10-CM

## 2021-04-06 DIAGNOSIS — I69354 Hemiplegia and hemiparesis following cerebral infarction affecting left non-dominant side: Secondary | ICD-10-CM

## 2021-04-06 DIAGNOSIS — M6281 Muscle weakness (generalized): Secondary | ICD-10-CM

## 2021-04-06 DIAGNOSIS — R2689 Other abnormalities of gait and mobility: Secondary | ICD-10-CM

## 2021-04-06 NOTE — Therapy (Signed)
[facility] [address] [place], [place], [address] Phone: [phone]   Fax:  [phone]  Physical Therapy Treatment  Patient Details  Name: William [name] MRN: [id number] Date of Birth: 09-26-1940 Referring Provider (PT): Laury Axon   Encounter Date: 04/06/2021   PT End of Session - 04/06/21 1348     Visit Number 12    Date for PT Re-Evaluation 05/26/21    PT Start Time 1313    PT Stop Time 1401    PT Time Calculation (min) 48 min    Activity Tolerance Patient tolerated treatment well;Patient limited by pain    Behavior During Therapy [facility] for tasks assessed/performed             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   Mild, related to stroke history   Nocturia    Renal insufficiency 06/25/2013   S/P carotid endarterectomy    BILATERAL ICA--  PATENT PER DUPLEX  05-19-2012   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 2016   cataract surgery both eyes   INGUINAL HERNIA REPAIR Right 11-08-2006   IR KYPHO EA ADDL LEVEL THORACIC OR LUMBAR  02/12/2021   IR RADIOLOGIST EVAL & MGMT  02/18/2021   MASS EXCISION N/A 03/03/2016   Procedure: EXCISION OF BACK  MASS;  Surgeon: Almond Lint, MD;  Location: [facility];  Service: General;  Laterality: N/A;   MOHS SURGERY Left 1/ 2016   Dr Margo Aye-- Basal cell   PROSTATE SURGERY     TRANSURETHRAL RESECTION OF PROSTATE N/A 02/26/2013   Procedure: TRANSURETHRAL RESECTION OF THE PROSTATE WITH GYRUS INSTRUMENTS;  Surgeon: Marcine Matar, MD;  Location: [facility];  Service: Urology;  Laterality: N/A;    There were no vitals filed for this visit.   Subjective Assessment - 04/06/21 1316     Subjective Patient reports that his back is really hurting, reports that walking the dog on Friday he started  feeling sore.  Repors some sharp pains.  He has an injection next Monday    Currently in Pain? Yes  Pain Score 8     Pain Location Back    Pain Orientation Lower    Pain Descriptors / Indicators Sore;Sharp    Aggravating Factors  walking    Pain Relieving Factors heat                               [facility] Adult PT Treatment/Exercise - 04/06/21 0001       Ambulation/Gait   Gait Comments outside in the grass with HHA      High Level Balance   High Level Balance Comments cone toe touches with SPC, stick step overs and back with SPC      Lumbar Exercises: Aerobic   Nustep level 3 x 6 minutes      Lumbar Exercises: Standing   Heel Raises 20 reps;1 second    Other Standing Lumbar Exercises back to wall with stick holding overhead and down working on posture for back pain    Other Standing Lumbar Exercises toe  raises holding on to walker      Lumbar Exercises: Seated   Long Arc Quad on Chair Left;3 sets;10 reps    LAQ on Chair Weights (lbs) 3    Other Seated Lumbar Exercises 3# marches, 3# left arm reaches    Other Seated Lumbar Exercises left DF 3x10 focus ont he movement      Moist Heat Therapy   Number Minutes Moist Heat 10 Minutes    Moist Heat Location Lumbar Spine      Electrical Stimulation   Electrical Stimulation Location lumbar spine    Electrical Stimulation Action IFC    Electrical Stimulation Parameters sitting    Electrical Stimulation Goals Pain                       PT Short Term Goals - 03/04/21 1448       PT SHORT TERM GOAL #1   Title independent with initial HEP    Status On-going               PT Long Term Goals - 04/01/21 1528       PT LONG TERM GOAL #1   Title Understand fall risks, the importance of exercise and flexibility to health and function    Status Partially Met      PT LONG TERM GOAL #2   Title decrease TUG time to 13 seconds    Status Partially Met                   Plan - 04/06/21 1350     Clinical Impression Statement Patient with increased pain in the low back today, he is moving slower, more stooped posture, more shuffling feet, he reports that he thinks it is from walking some last week, he reports heat and rest helps, walking makes it worse, I did have him walk some today and he did not do well, requiring HHA, but did not report an increase of pain.  He has an injection scheduled for next Monday    PT Next Visit Plan continue to work on function, balance and strength, try getting up from floor see if his pain is less    Consulted and Agree with Plan of Care Patient             Patient will benefit from skilled therapeutic intervention in order to improve the following deficits and impairments:  Abnormal gait, Decreased  coordination, Decreased range of motion, Difficulty walking, Increased muscle spasms,  Cardiopulmonary status limiting activity, Decreased endurance, Decreased activity tolerance, Pain, Impaired flexibility, Decreased balance, Decreased mobility, Decreased strength, Postural dysfunction  Visit Diagnosis: Hemiplegia and hemiparesis following cerebral infarction affecting left non-dominant side ([place])  Other lack of coordination  Difficulty in walking, not elsewhere classified  Muscle weakness (generalized)  Repeated falls  Other abnormalities of gait and mobility  Acute bilateral low back pain without sciatica     Problem List Patient Active Problem List   Diagnosis Date Noted   Closed fracture of first lumbar vertebra with routine healing 02/03/2021   Closed fracture of multiple ribs 11/18/2020   Depression with anxiety 01/29/2020   Leg pain, bilateral 01/29/2020   Ingrown toenail 07/13/2019   Lumbar spondylosis 05/02/2018   Pain in left knee 03/09/2018   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   Trochanteric bursitis of left hip 01/16/2018   Preventative health care 09/26/2017   HTN (hypertension) 07/19/2015   Hyperlipidemia 07/19/2015   Great toe pain 02/11/2014   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 [place], [name], [address] Phone: [phone]   Fax:  [phone]  Name: William [name] MRN: [id number] Date of Birth: Oct 24, 1940

## 2021-04-07 NOTE — Addendum Note (Signed)
Addended by: Kelle Darting A on: 04/07/2021 08:00 AM   Modules accepted: Orders

## 2021-04-08 ENCOUNTER — Other Ambulatory Visit: Payer: Self-pay

## 2021-04-08 ENCOUNTER — Ambulatory Visit: Payer: PPO | Admitting: Physical Therapy

## 2021-04-08 ENCOUNTER — Encounter: Payer: Self-pay | Admitting: Physical Therapy

## 2021-04-08 DIAGNOSIS — I69354 Hemiplegia and hemiparesis following cerebral infarction affecting left non-dominant side: Secondary | ICD-10-CM

## 2021-04-08 DIAGNOSIS — R296 Repeated falls: Secondary | ICD-10-CM

## 2021-04-08 DIAGNOSIS — M6281 Muscle weakness (generalized): Secondary | ICD-10-CM

## 2021-04-08 DIAGNOSIS — R262 Difficulty in walking, not elsewhere classified: Secondary | ICD-10-CM

## 2021-04-08 DIAGNOSIS — R278 Other lack of coordination: Secondary | ICD-10-CM

## 2021-04-08 NOTE — Therapy (Signed)
[place] [place], [name], [address] Phone: [phone]   Fax:  [phone]  Physical Therapy Treatment  Patient Details  Name: William [name] MRN: [id number] Date of Birth: 11-12-1940 Referring Provider (PT): Etter Sjogren   Encounter Date: 04/08/2021   PT End of Session - 04/08/21 1349     Visit Number 13    Date for PT Re-Evaluation 05/26/21    PT Start Time 0626    PT Stop Time 1402    PT Time Calculation (min) 55 min    Activity Tolerance Patient tolerated treatment well;Patient limited by pain    Behavior During Therapy [facility] for tasks assessed/performed             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   Mild, related to stroke history   Nocturia    Renal insufficiency 06/25/2013   S/P carotid endarterectomy    BILATERAL ICA--  PATENT PER DUPLEX  05-19-2012   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   Right calf   Urinary frequency    Vitamin D deficiency     Past Surgical History:  Procedure Laterality Date   APPENDECTOMY  AS CHILD   CARDIOVASCULAR STRESS TEST  03-27-2012  DR CRENSHAW   LOW RISK LEXISCAN STUDY-- PROBABLE NORMAL PERFUSION AND SOFT TISSUE ATTENUATION/  NO ISCHEMIA/ EF 51%   CAROTID ENDARTERECTOMY Bilateral LEFT  11-12-2008  DR GREG HAYES   RIGHT ICA  2006  ([facility])    CYSTOSCOPY WITH LITHOLAPAXY N/A 02/26/2013   Procedure: CYSTOSCOPY WITH LITHOLAPAXY;  Surgeon: Franchot Gallo, MD;  Location: [facility];  Service: Urology;  Laterality: N/A;   EYE SURGERY  Jan. 2016   cataract surgery both eyes   INGUINAL HERNIA REPAIR Right 11-08-2006   IR KYPHO EA ADDL LEVEL THORACIC OR LUMBAR  02/12/2021   IR RADIOLOGIST EVAL & MGMT  02/18/2021   MASS EXCISION N/A 03/03/2016   Procedure: EXCISION OF BACK  MASS;  Surgeon: Stark Klein, MD;  Location: [place];  Service: General;  Laterality: N/A;   MOHS SURGERY Left 1/ 2016   Dr Nevada Crane-- Basal cell   PROSTATE SURGERY     TRANSURETHRAL RESECTION OF PROSTATE N/A 02/26/2013   Procedure: TRANSURETHRAL RESECTION OF THE PROSTATE WITH GYRUS INSTRUMENTS;  Surgeon: Franchot Gallo, MD;  Location: [facility];  Service: Urology;  Laterality: N/A;    There were no vitals filed for this visit.   Subjective Assessment - 04/08/21 1310     Subjective Still with the back pain, reports not as bad as Monday, reports that he has laid off the walking due to the pain    Currently in Pain? Yes    Pain Score 6     Pain  Location Back    Pain Orientation Lower                               [facility] Adult PT Treatment/Exercise - 04/08/21 0001       High Level Balance   High Level Balance Activities Side stepping;Negotiating over obstacles    High Level Balance Comments cone toe touches with SPC, stick step overs and back with SPC      Lumbar Exercises: Aerobic   Nustep level 4 x 6 minutes      Lumbar Exercises: Machines for Strengthening   Other Lumbar Machine Exercise lats and seated row focus on the left 20# 2x10      Lumbar Exercises: Standing   Heel Raises 20 reps;1 second    Other Standing Lumbar Exercises hip abduction, extension and marching 2.5# all 2x10    Other Standing Lumbar Exercises toe raises holding on to walker      Lumbar Exercises: Seated   Long  Arc Quad on Chair Left;3 sets;10 reps    LAQ on Chair Weights (lbs) 3    Other Seated Lumbar Exercises left DF 3x10 focus ont he movement      Moist Heat Therapy   Number Minutes Moist Heat 10 Minutes    Moist Heat Location Lumbar Spine      Electrical Stimulation   Electrical Stimulation Location lumbar spine    Electrical Stimulation Action IFC    Electrical Stimulation Parameters sitting    Electrical Stimulation Goals Pain                       PT Short Term Goals - 03/04/21 1448       PT SHORT TERM GOAL #1   Title independent with initial HEP    Status On-going               PT Long Term Goals - 04/08/21 1353       PT LONG TERM GOAL #1   Title Understand fall risks, the importance of exercise and flexibility to health and function    Status Partially Met      PT LONG TERM GOAL #2   Title decrease TUG time to 13 seconds    Status Partially Met      PT LONG TERM GOAL #3   Title walk around our building without rest     Status On-going                   Plan - 04/08/21 1352     Clinical Impression Statement Patient with less pain but still hurting, he was moving better and easier today, but the longer he stood the more pain he had in his back.  He does not trust himself on the dynamic surfaces    PT Next Visit Plan continue to work on function, balance and strength, try getting up from floor see if his pain is less    Consulted and Agree with Plan of Care Patient             Patient will benefit from skilled therapeutic intervention in order to improve the following deficits and impairments:  Abnormal gait, Decreased coordination, Decreased range of motion, Difficulty walking, Increased muscle spasms, Cardiopulmonary status limiting activity, Decreased endurance, Decreased activity tolerance, Pain, Impaired flexibility, Decreased balance, Decreased mobility, Decreased strength, Postural dysfunction  Visit Diagnosis: Hemiplegia and  hemiparesis following cerebral infarction  affecting left non-dominant side ([place])  Other lack of coordination  Difficulty in walking, not elsewhere classified  Muscle weakness (generalized)  Repeated falls     Problem List Patient Active Problem List   Diagnosis Date Noted   Closed fracture of first lumbar vertebra with routine healing 02/03/2021   Closed fracture of multiple ribs 11/18/2020   Depression with anxiety 01/29/2020   Leg pain, bilateral 01/29/2020   Ingrown toenail 07/13/2019   Lumbar spondylosis 05/02/2018   Pain in left knee 03/09/2018   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   Trochanteric bursitis of left hip 01/16/2018   Preventative health care 09/26/2017   HTN (hypertension) 07/19/2015   Hyperlipidemia 07/19/2015   Great toe pain 02/11/2014   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 [place], [name], [address] Phone: [phone]   Fax:  [phone]  Name: William [name] MRN: [id number] Date of Birth: [birth year]

## 2021-04-09 ENCOUNTER — Other Ambulatory Visit (INDEPENDENT_AMBULATORY_CARE_PROVIDER_SITE_OTHER): Payer: PPO

## 2021-04-09 DIAGNOSIS — D649 Anemia, unspecified: Secondary | ICD-10-CM

## 2021-04-09 LAB — FECAL OCCULT BLOOD, IMMUNOCHEMICAL: Fecal Occult Bld: NEGATIVE

## 2021-04-13 ENCOUNTER — Ambulatory Visit: Payer: PPO | Admitting: Physical Therapy

## 2021-04-13 DIAGNOSIS — M47816 Spondylosis without myelopathy or radiculopathy, lumbar region: Secondary | ICD-10-CM | POA: Diagnosis not present

## 2021-04-15 ENCOUNTER — Other Ambulatory Visit: Payer: Self-pay

## 2021-04-15 ENCOUNTER — Ambulatory Visit: Payer: PPO | Attending: Family Medicine | Admitting: Physical Therapy

## 2021-04-15 ENCOUNTER — Encounter: Payer: Self-pay | Admitting: Physical Therapy

## 2021-04-15 DIAGNOSIS — R296 Repeated falls: Secondary | ICD-10-CM | POA: Diagnosis not present

## 2021-04-15 DIAGNOSIS — M6281 Muscle weakness (generalized): Secondary | ICD-10-CM | POA: Diagnosis not present

## 2021-04-15 DIAGNOSIS — R2689 Other abnormalities of gait and mobility: Secondary | ICD-10-CM

## 2021-04-15 DIAGNOSIS — R278 Other lack of coordination: Secondary | ICD-10-CM | POA: Diagnosis not present

## 2021-04-15 DIAGNOSIS — M545 Low back pain, unspecified: Secondary | ICD-10-CM

## 2021-04-15 DIAGNOSIS — R262 Difficulty in walking, not elsewhere classified: Secondary | ICD-10-CM | POA: Diagnosis not present

## 2021-04-15 DIAGNOSIS — I69354 Hemiplegia and hemiparesis following cerebral infarction affecting left non-dominant side: Secondary | ICD-10-CM | POA: Diagnosis not present

## 2021-04-15 NOTE — Therapy (Signed)
[place]. [place], [name], [address] Phone: [phone]   Fax:  [phone]  Physical Therapy Treatment  Patient Details  Name: William [name] MRN: [id number] Date of Birth: [birth year] Referring Provider (PT): Etter Sjogren   Encounter Date: 04/15/2021   PT End of Session - 04/15/21 1515     Visit Number 14    Date for PT Re-Evaluation 05/26/21    PT Start Time 1438    PT Stop Time 1525    PT Time Calculation (min) 47 min    Activity Tolerance Patient tolerated treatment well;Patient limited by pain    Behavior During Therapy [facility] for tasks assessed/performed             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   Mild, related to stroke history   Nocturia    Renal insufficiency 06/25/2013   S/P carotid endarterectomy    BILATERAL ICA--  PATENT PER DUPLEX  05-19-2012   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   Right calf   Urinary frequency    Vitamin D deficiency     Past Surgical History:  Procedure Laterality Date   APPENDECTOMY  AS CHILD   CARDIOVASCULAR STRESS TEST  03-27-2012  DR CRENSHAW   LOW RISK LEXISCAN STUDY-- PROBABLE NORMAL PERFUSION AND SOFT TISSUE ATTENUATION/  NO ISCHEMIA/ EF 51%   CAROTID ENDARTERECTOMY Bilateral LEFT  11-12-2008  DR GREG HAYES   RIGHT ICA  2006  ([facility])    CYSTOSCOPY WITH LITHOLAPAXY N/A 02/26/2013   Procedure: CYSTOSCOPY WITH LITHOLAPAXY;  Surgeon: Franchot Gallo, MD;  Location: [facility];  Service: Urology;  Laterality: N/A;   EYE SURGERY  Jan. 2016   cataract surgery both eyes   INGUINAL HERNIA REPAIR Right 11-08-2006   IR KYPHO EA ADDL LEVEL THORACIC OR LUMBAR  02/12/2021   IR RADIOLOGIST EVAL & MGMT  02/18/2021   MASS EXCISION N/A 03/03/2016   Procedure: EXCISION OF BACK  MASS;  Surgeon: Stark Klein, MD;  Location: [place];  Service: General;  Laterality: N/A;   MOHS SURGERY Left 1/ 2016   Dr Nevada Crane-- Basal cell   PROSTATE SURGERY     TRANSURETHRAL RESECTION OF PROSTATE N/A 02/26/2013   Procedure: TRANSURETHRAL RESECTION OF THE PROSTATE WITH GYRUS INSTRUMENTS;  Surgeon: Franchot Gallo, MD;  Location: [facility];  Service: Urology;  Laterality: N/A;    There were no vitals filed for this visit.   Subjective Assessment - 04/15/21 1506     Subjective Patient reports that he had a lumbar injection on Monday, he had really good results in the past, reports he is worse since the injection reports low back pain a 7/10, reports not sleeping well due to pain and not walking  well now    Currently in Pain? Yes    Pain Score 7     Pain Location Back    Pain Orientation Lower    Pain Descriptors / Indicators Sore    Aggravating Factors  worse after the injection                               [facility] Adult PT Treatment/Exercise - 04/15/21 0001       Lumbar Exercises: Stretches   Passive Hamstring Stretch Right;Left;3 reps;20 seconds    Passive Hamstring Stretch Limitations in sitting      Lumbar Exercises: Aerobic   Nustep level 3 x 4 minutes      Lumbar Exercises: Standing   Heel Raises 20 reps;1 second      Lumbar Exercises: Seated   Long Arc Quad on Chair Left;3 sets;10 reps    LAQ on Chair Weights (lbs) 3    Other Seated Lumbar Exercises ball b/n knees squeeze  and then red tband hip abduction, red tband rows ands extensions, pelvic tilts    Other Seated Lumbar Exercises left DF 3x10 focus ont he movement      Moist Heat Therapy   Number Minutes Moist Heat 14 Minutes    Moist Heat Location Lumbar Spine      Electrical Stimulation   Electrical Stimulation Location lumbar spine    Electrical Stimulation Action IFC    Electrical Stimulation Parameters sitting    Electrical Stimulation Goals Pain                       PT Short Term Goals - 03/04/21 1448       PT SHORT TERM GOAL #1   Title independent with initial HEP    Status On-going               PT Long Term Goals - 04/15/21 1517       PT LONG TERM GOAL #1   Title Understand fall risks, the importance of exercise and flexibility to health and function    Status Achieved                   Plan - 04/15/21 1515     Clinical Impression Statement Reports more pain since the injection, reports that he has not been sleeping or walking well, has a higher rating of pain and is not moving well.He reports that he called the MD and they will schedule a nerve ablation in the future.    PT Next Visit Plan will try to do what we can for function but also try not to cause increase in pain    Consulted and Agree with Plan of Care Patient             Patient will benefit from skilled therapeutic intervention in order to improve the following deficits and impairments:  Abnormal gait, Decreased coordination, Decreased range of motion, Difficulty walking, Increased muscle spasms, Cardiopulmonary status limiting activity, Decreased endurance, Decreased activity tolerance, Pain, Impaired flexibility, Decreased balance, Decreased mobility, Decreased strength, Postural dysfunction  Visit Diagnosis: Hemiplegia and hemiparesis following cerebral infarction affecting left non-dominant side ([facility])  Other lack of coordination  Difficulty in walking, not elsewhere  classified  Muscle weakness (generalized)  Repeated falls  Other abnormalities of gait and mobility  Acute bilateral low back pain without sciatica     Problem List Patient Active Problem List  Diagnosis Date Noted   Closed fracture of first lumbar vertebra with routine healing 02/03/2021   Closed fracture of multiple ribs 11/18/2020   Depression with anxiety 01/29/2020   Leg pain, bilateral 01/29/2020   Ingrown toenail 07/13/2019   Lumbar spondylosis 05/02/2018   Pain in left knee 03/09/2018   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   Trochanteric bursitis of left hip 01/16/2018   Preventative health care 09/26/2017   HTN (hypertension) 07/19/2015   Hyperlipidemia 07/19/2015   Great toe pain 02/11/2014   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 [place], [name], [address] Phone: [phone]   Fax:  [phone]  Name: NORTH ESTERLINE MRN: [id number] Date of Birth: 29-Sep-1940

## 2021-04-20 ENCOUNTER — Ambulatory Visit: Payer: PPO | Admitting: Physical Therapy

## 2021-04-21 ENCOUNTER — Other Ambulatory Visit (HOSPITAL_BASED_OUTPATIENT_CLINIC_OR_DEPARTMENT_OTHER): Payer: Self-pay

## 2021-04-21 ENCOUNTER — Ambulatory Visit: Payer: PPO | Admitting: Physical Therapy

## 2021-04-21 DIAGNOSIS — R3915 Urgency of urination: Secondary | ICD-10-CM | POA: Diagnosis not present

## 2021-04-21 DIAGNOSIS — N401 Enlarged prostate with lower urinary tract symptoms: Secondary | ICD-10-CM | POA: Diagnosis not present

## 2021-04-21 DIAGNOSIS — R3121 Asymptomatic microscopic hematuria: Secondary | ICD-10-CM | POA: Diagnosis not present

## 2021-04-21 DIAGNOSIS — R310 Gross hematuria: Secondary | ICD-10-CM | POA: Diagnosis not present

## 2021-04-22 ENCOUNTER — Ambulatory Visit: Payer: PPO | Admitting: Physical Therapy

## 2021-04-22 ENCOUNTER — Encounter: Payer: Self-pay | Admitting: Physical Therapy

## 2021-04-22 ENCOUNTER — Other Ambulatory Visit: Payer: Self-pay

## 2021-04-22 DIAGNOSIS — R262 Difficulty in walking, not elsewhere classified: Secondary | ICD-10-CM

## 2021-04-22 DIAGNOSIS — R296 Repeated falls: Secondary | ICD-10-CM

## 2021-04-22 DIAGNOSIS — I69354 Hemiplegia and hemiparesis following cerebral infarction affecting left non-dominant side: Secondary | ICD-10-CM

## 2021-04-22 DIAGNOSIS — M6281 Muscle weakness (generalized): Secondary | ICD-10-CM

## 2021-04-22 DIAGNOSIS — R2689 Other abnormalities of gait and mobility: Secondary | ICD-10-CM

## 2021-04-22 DIAGNOSIS — R278 Other lack of coordination: Secondary | ICD-10-CM

## 2021-04-22 DIAGNOSIS — M545 Low back pain, unspecified: Secondary | ICD-10-CM

## 2021-04-22 NOTE — Therapy (Signed)
[place] [place], [name], [address] Phone: [phone]   Fax:  [phone]  Physical Therapy Treatment  Patient Details  Name: William [name] MRN: [id number] Date of Birth: 10-Aug-1940 Referring Provider (PT): Etter Sjogren   Encounter Date: 04/22/2021   PT End of Session - 04/22/21 1523     Visit Number 15    Date for PT Re-Evaluation 05/26/21    PT Start Time 3559    PT Stop Time 7416    PT Time Calculation (min) 58 min    Activity Tolerance Patient tolerated treatment well;Patient limited by pain    Behavior During Therapy [facility] for tasks assessed/performed             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   Mild, related to stroke history   Nocturia    Renal insufficiency 06/25/2013   S/P carotid endarterectomy    BILATERAL ICA--  PATENT PER DUPLEX  05-19-2012   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   Right calf   Urinary frequency    Vitamin D deficiency     Past Surgical History:  Procedure Laterality Date   APPENDECTOMY  AS CHILD   CARDIOVASCULAR STRESS TEST  03-27-2012  DR CRENSHAW   LOW RISK LEXISCAN STUDY-- PROBABLE NORMAL PERFUSION AND SOFT TISSUE ATTENUATION/  NO ISCHEMIA/ EF 51%   CAROTID ENDARTERECTOMY Bilateral LEFT  11-12-2008  DR GREG HAYES   RIGHT ICA  2006  ([facility])    CYSTOSCOPY WITH LITHOLAPAXY N/A 02/26/2013   Procedure: CYSTOSCOPY WITH LITHOLAPAXY;  Surgeon: Franchot Gallo, MD;  Location: [facility];  Service: Urology;  Laterality: N/A;   EYE SURGERY  Jan. 2016   cataract surgery both eyes   INGUINAL HERNIA REPAIR Right 11-08-2006   IR KYPHO EA ADDL LEVEL THORACIC OR LUMBAR  02/12/2021   IR RADIOLOGIST EVAL & MGMT  02/18/2021   MASS EXCISION N/A 03/03/2016   Procedure: EXCISION OF BACK  MASS;  Surgeon: Stark Klein, MD;  Location: [facility];  Service: General;  Laterality: N/A;   MOHS SURGERY Left 1/ 2016   Dr Nevada Crane-- Basal cell   PROSTATE SURGERY     TRANSURETHRAL RESECTION OF PROSTATE N/A 02/26/2013   Procedure: TRANSURETHRAL RESECTION OF THE PROSTATE WITH GYRUS INSTRUMENTS;  Surgeon: Franchot Gallo, MD;  Location: [facility];  Service: Urology;  Laterality: N/A;    There were no vitals filed for this visit.   Subjective Assessment - 04/22/21 1439     Subjective Reports that she still has back pain, seeing MD next week, wife reports that he had a test last week that showed blood in his urine, she also reports some confusion    Currently in Pain? Yes  Pain Score 6     Pain Location Back    Pain Orientation Lower    Pain Descriptors / Indicators Sore    Aggravating Factors  reports has just been hurting                               [facility] Adult PT Treatment/Exercise - 04/22/21 0001       Ambulation/Gait   Gait Comments walk outside close supervision/SBA due to a little more shuffling today      High Level Balance   High Level Balance Activities Side stepping    High Level Balance Comments marching in place, cone toe touches      Lumbar Exercises: Aerobic   UBE (Upper Arm Bike) Level 3 x 4 minutes    Nustep level 4 x 6 minutes      Lumbar Exercises: Standing   Heel Raises 20 reps;1 second    Other Standing Lumbar Exercises hip abduction, extension and marching 2.5#  all 2x10    Other Standing Lumbar Exercises toe raises holding on to walker      Lumbar Exercises: Seated   Long Arc Quad on Chair Left;3 sets;10 reps    LAQ on Chair Weights (lbs) 3    Other Seated Lumbar Exercises ball b/n knees squeeze and then red tband hip abduction, red tband rows and extension, pelvic tilts, marches 2#    Other Seated Lumbar Exercises left DF 3x10 focus on the movement      Moist Heat Therapy   Number Minutes Moist Heat 12 Minutes    Moist Heat Location Lumbar Spine      Electrical Stimulation   Electrical Stimulation Location lumbar spine    Electrical Stimulation Action IFC    Electrical Stimulation Parameters sitting    Electrical Stimulation Goals Pain                       PT Short Term Goals - 03/04/21 1448       PT SHORT TERM GOAL #1   Title independent with initial HEP    Status On-going               PT Long Term Goals - 04/22/21 1538       PT LONG TERM GOAL #2   Title decrease TUG time to 13 seconds    Status Partially Met      PT LONG TERM GOAL #3   Title walk around our building without rest     Status On-going      PT LONG TERM GOAL #4   Title decrease pain with walking by 50%    Status On-going                   Plan - 04/22/21 1523     Clinical Impression Statement Patient really struggling today, still with the increased back pain, shuffling gait more and wife does report some increased confusion, they did find blood in the urine.  He seems more lethargic, they do tell me that he will have a CT scan next week and has been started on a new medication, he will also see MD about the back in the very near future    PT Next Visit Plan will try to do what we can for function but also try not to cause increase in pain    Consulted and Agree with Plan of Care Patient  Patient will benefit from skilled therapeutic intervention in order to improve the following deficits and impairments:   Abnormal gait, Decreased coordination, Decreased range of motion, Difficulty walking, Increased muscle spasms, Cardiopulmonary status limiting activity, Decreased endurance, Decreased activity tolerance, Pain, Impaired flexibility, Decreased balance, Decreased mobility, Decreased strength, Postural dysfunction  Visit Diagnosis: Hemiplegia and hemiparesis following cerebral infarction affecting left non-dominant side (HCC)  Other lack of coordination  Difficulty in walking, not elsewhere classified  Muscle weakness (generalized)  Repeated falls  Other abnormalities of gait and mobility  Acute bilateral low back pain without sciatica     Problem List Patient Active Problem List   Diagnosis Date Noted   Closed fracture of first lumbar vertebra with routine healing 02/03/2021   Closed fracture of multiple ribs 11/18/2020   Depression with anxiety 01/29/2020   Leg pain, bilateral 01/29/2020   Ingrown toenail 07/13/2019   Lumbar spondylosis 05/02/2018   Pain in left knee 03/09/2018   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   Trochanteric bursitis of left hip 01/16/2018   Preventative health care 09/26/2017   HTN (hypertension) 07/19/2015   Hyperlipidemia 07/19/2015   Great toe pain 02/11/2014   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 [place], [name], [address] Phone: [phone]   Fax:   [phone]  Name: William [name] MRN: [id number] Date of Birth: 10/31/1940

## 2021-04-27 ENCOUNTER — Ambulatory Visit: Payer: PPO | Admitting: Physical Therapy

## 2021-04-27 ENCOUNTER — Other Ambulatory Visit: Payer: Self-pay

## 2021-04-27 ENCOUNTER — Ambulatory Visit: Payer: PPO | Admitting: Podiatry

## 2021-04-27 DIAGNOSIS — B351 Tinea unguium: Secondary | ICD-10-CM | POA: Diagnosis not present

## 2021-04-27 DIAGNOSIS — M79676 Pain in unspecified toe(s): Secondary | ICD-10-CM | POA: Diagnosis not present

## 2021-04-27 NOTE — Progress Notes (Signed)
Subjective: 80 y.o. returns the office today for painful, elongated, thickened toenails which he cannot trim himself.  Denies any open lesions.  He has no other concerns today to his feet.  Denies any systemic complaints such as fevers, chills, nausea, vomiting.   PCP: Carollee Herter, Alferd Apa, DO  Objective: NAD, presents with his wife DP/PT pulses palpable, CRT less than 3 seconds  Nails hypertrophic, dystrophic, elongated, brittle, discolored x 10. There is tenderness overlying the nails 1-5 bilaterally. There is no edema, erythema or drainage to the toenail sites today. Hammertoes present No open lesions or pre-ulcerative lesions are identified. No pain with calf compression, swelling, warmth, erythema.  Assessment: Patient presents with symptomatic onychomycosis  Plan: -Treatment options including alternatives, risks, complications were discussed -Nails debrided x 10 without any complications or bleeding -Continue daily dressing changes for the abrasion.  Monitoring signs or symptoms of infection.  Return in 9 weeks or sooner if needed  Celesta Gentile, DPM

## 2021-04-28 ENCOUNTER — Ambulatory Visit (HOSPITAL_COMMUNITY): Payer: PPO | Admitting: Psychiatry

## 2021-04-28 DIAGNOSIS — R310 Gross hematuria: Secondary | ICD-10-CM | POA: Diagnosis not present

## 2021-04-28 DIAGNOSIS — K8020 Calculus of gallbladder without cholecystitis without obstruction: Secondary | ICD-10-CM | POA: Diagnosis not present

## 2021-04-28 DIAGNOSIS — Z87442 Personal history of urinary calculi: Secondary | ICD-10-CM | POA: Diagnosis not present

## 2021-04-28 DIAGNOSIS — K5730 Diverticulosis of large intestine without perforation or abscess without bleeding: Secondary | ICD-10-CM | POA: Diagnosis not present

## 2021-04-29 ENCOUNTER — Ambulatory Visit: Payer: PPO | Admitting: Physical Therapy

## 2021-04-29 ENCOUNTER — Ambulatory Visit (HOSPITAL_BASED_OUTPATIENT_CLINIC_OR_DEPARTMENT_OTHER): Payer: PPO | Admitting: Psychiatry

## 2021-04-29 ENCOUNTER — Other Ambulatory Visit: Payer: Self-pay

## 2021-04-29 ENCOUNTER — Encounter: Payer: Self-pay | Admitting: Physical Therapy

## 2021-04-29 ENCOUNTER — Other Ambulatory Visit (HOSPITAL_BASED_OUTPATIENT_CLINIC_OR_DEPARTMENT_OTHER): Payer: Self-pay

## 2021-04-29 DIAGNOSIS — R278 Other lack of coordination: Secondary | ICD-10-CM

## 2021-04-29 DIAGNOSIS — F4323 Adjustment disorder with mixed anxiety and depressed mood: Secondary | ICD-10-CM | POA: Diagnosis not present

## 2021-04-29 DIAGNOSIS — F0630 Mood disorder due to known physiological condition, unspecified: Secondary | ICD-10-CM

## 2021-04-29 DIAGNOSIS — R262 Difficulty in walking, not elsewhere classified: Secondary | ICD-10-CM

## 2021-04-29 DIAGNOSIS — I69354 Hemiplegia and hemiparesis following cerebral infarction affecting left non-dominant side: Secondary | ICD-10-CM

## 2021-04-29 DIAGNOSIS — M546 Pain in thoracic spine: Secondary | ICD-10-CM | POA: Diagnosis not present

## 2021-04-29 DIAGNOSIS — M6281 Muscle weakness (generalized): Secondary | ICD-10-CM

## 2021-04-29 DIAGNOSIS — R296 Repeated falls: Secondary | ICD-10-CM

## 2021-04-29 DIAGNOSIS — R2689 Other abnormalities of gait and mobility: Secondary | ICD-10-CM

## 2021-04-29 DIAGNOSIS — M5459 Other low back pain: Secondary | ICD-10-CM | POA: Diagnosis not present

## 2021-04-29 DIAGNOSIS — M545 Low back pain, unspecified: Secondary | ICD-10-CM

## 2021-04-29 MED ORDER — LORAZEPAM 0.5 MG PO TABS
ORAL_TABLET | ORAL | 5 refills | Status: DC
Start: 2021-04-29 — End: 2021-07-29
  Filled 2021-04-29: qty 90, 30d supply, fill #0
  Filled 2021-06-07: qty 90, 30d supply, fill #1
  Filled 2021-07-20: qty 90, 30d supply, fill #2

## 2021-04-29 MED ORDER — CARBAMAZEPINE ER 100 MG PO TB12
ORAL_TABLET | ORAL | 1 refills | Status: DC
  Filled 2021-04-29: qty 270, 90d supply, fill #0

## 2021-04-29 MED ORDER — ESZOPICLONE 3 MG PO TABS
ORAL_TABLET | ORAL | 2 refills | Status: DC
Start: 2021-04-29 — End: 2021-08-18
  Filled 2021-04-29: qty 90, 90d supply, fill #0

## 2021-04-29 MED ORDER — TRAMADOL HCL 50 MG PO TABS
ORAL_TABLET | ORAL | 0 refills | Status: DC
  Filled 2021-04-29: qty 30, 10d supply, fill #0

## 2021-04-29 NOTE — Therapy (Signed)
[address] [place], [name], [address] Phone: [phone]   Fax:  [phone]  Physical Therapy Treatment  Patient Details  Name: William [name] MRN: [id number] Date of Birth: 1940/11/01 Referring Provider (PT): Etter Sjogren   Encounter Date: 04/29/2021   PT End of Session - 04/29/21 1432     Visit Number 16    Date for PT Re-Evaluation 05/26/21    PT Start Time 2751    PT Stop Time 1455    PT Time Calculation (min) 58 min    Activity Tolerance Patient tolerated treatment well;Patient limited by pain    Behavior During Therapy [facility] for tasks assessed/performed             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   Mild, related to stroke history   Nocturia    Renal insufficiency 06/25/2013   S/P carotid endarterectomy    BILATERAL ICA--  PATENT PER DUPLEX  05-19-2012   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   Right calf   Urinary frequency    Vitamin D deficiency     Past Surgical History:  Procedure Laterality Date   APPENDECTOMY  AS CHILD   CARDIOVASCULAR STRESS TEST  03-27-2012  DR CRENSHAW   LOW RISK LEXISCAN STUDY-- PROBABLE NORMAL PERFUSION AND SOFT TISSUE ATTENUATION/  NO ISCHEMIA/ EF 51%   CAROTID ENDARTERECTOMY Bilateral LEFT  11-12-2008  DR GREG HAYES   RIGHT ICA  2006  ([facility])    CYSTOSCOPY WITH LITHOLAPAXY N/A 02/26/2013   Procedure: CYSTOSCOPY WITH LITHOLAPAXY;  Surgeon: Franchot Gallo, MD;  Location: [facility];  Service: Urology;  Laterality: N/A;   EYE SURGERY  Jan. 2016   cataract surgery both eyes   INGUINAL HERNIA REPAIR Right 11-08-2006   IR KYPHO EA ADDL LEVEL THORACIC OR LUMBAR  02/12/2021   IR RADIOLOGIST EVAL & MGMT  02/18/2021   MASS EXCISION N/A 03/03/2016   Procedure: EXCISION OF BACK  MASS;  Surgeon: Stark Klein, MD;  Location: [facility];  Service: General;  Laterality: N/A;   MOHS SURGERY Left 1/ 2016   Dr Nevada Crane-- Basal cell   PROSTATE SURGERY     TRANSURETHRAL RESECTION OF PROSTATE N/A 02/26/2013   Procedure: TRANSURETHRAL RESECTION OF THE PROSTATE WITH GYRUS INSTRUMENTS;  Surgeon: Franchot Gallo, MD;  Location: [facility];  Service: Urology;  Laterality: N/A;    There were no vitals filed for this visit.   Subjective Assessment - 04/29/21 1400     Subjective been through a lot, a lot of tests, I have a polyp, I will fly to [place] on Monday, saw the back doctor, will have an MRI in the future    Currently in Pain? Yes    Pain  Score 4     Pain Location Back    Pain Orientation Lower    Aggravating Factors  hurts with walking                               [facility] Adult PT Treatment/Exercise - 04/29/21 0001       High Level Balance   High Level Balance Activities Side stepping;Backward walking    High Level Balance Comments ball toss, cone toe touches, cone piskups,      Lumbar Exercises: Aerobic   Nustep level 5 x 6 minutes      Lumbar Exercises: Machines for Strengthening   Cybex Knee Flexion 20# 2x10    Other Lumbar Machine Exercise lats and seated row focus on the left 20# 2x10    Other Lumbar Machine Exercise 5# standing rows, a lot of cues for left when doing both, when left only just needs cues      Lumbar Exercises: Standing   Heel Raises 20 reps;1 second     Other Standing Lumbar Exercises hip abduction, extension and marching 2.5# all 2x10      Lumbar Exercises: Seated   Long Arc Quad on Chair Left;3 sets;10 reps    LAQ on Chair Weights (lbs) 3    Other Seated Lumbar Exercises ball b/n knees squeeze and then red tband hip abduction, red tband rows and extension, pelvic tilts, marches 2#      Moist Heat Therapy   Number Minutes Moist Heat 10 Minutes    Moist Heat Location Lumbar Spine      Electrical Stimulation   Electrical Stimulation Location lumbar spine    Electrical Stimulation Action IFC    Electrical Stimulation Parameters sitting    Electrical Stimulation Goals Pain                       PT Short Term Goals - 04/29/21 1434       PT SHORT TERM GOAL #1   Title independent with initial HEP    Status Achieved               PT Long Term Goals - 04/22/21 1538       PT LONG TERM GOAL #2   Title decrease TUG time to 13 seconds    Status Partially Met      PT LONG TERM GOAL #3   Title walk around our building without rest     Status On-going      PT LONG TERM GOAL #4   Title decrease pain with walking by 50%    Status On-going                   Plan - 04/29/21 1432     Clinical Impression Statement pain a little better, he was able to tolerate some increased exercise and some balance activities, he still reports not feeling stable or safe with his balance, he has not had any significant loss of balance with me but most times when he feels this way he will just not attempt the balance activities.  Still pain will have an MRI in the future    PT Next Visit Plan he will be out of town next weel    Consulted and Agree with Plan of Care Patient             Patient will benefit from skilled therapeutic intervention in order to  improve the following deficits and impairments:  Abnormal gait, Decreased coordination, Decreased range of motion, Difficulty walking, Increased muscle spasms,  Cardiopulmonary status limiting activity, Decreased endurance, Decreased activity tolerance, Pain, Impaired flexibility, Decreased balance, Decreased mobility, Decreased strength, Postural dysfunction  Visit Diagnosis: Hemiplegia and hemiparesis following cerebral infarction affecting left non-dominant side (HCC)  Other lack of coordination  Difficulty in walking, not elsewhere classified  Muscle weakness (generalized)  Repeated falls  Other abnormalities of gait and mobility  Acute bilateral low back pain without sciatica     Problem List Patient Active Problem List   Diagnosis Date Noted   Closed fracture of first lumbar vertebra with routine healing 02/03/2021   Closed fracture of multiple ribs 11/18/2020   Depression with anxiety 01/29/2020   Leg pain, bilateral 01/29/2020   Ingrown toenail 07/13/2019   Lumbar spondylosis 05/02/2018   Pain in left knee 03/09/2018   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   Trochanteric bursitis of left hip 01/16/2018   Preventative health care 09/26/2017   HTN (hypertension) 07/19/2015   Hyperlipidemia 07/19/2015   Great toe pain 02/11/2014   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 [place], [name], [address] Phone: [phone]   Fax:  [phone]  Name: William [name] MRN: [id number] Date of Birth: [birth year]

## 2021-04-29 NOTE — Progress Notes (Signed)
Patient ID: William [name], male   DOB: 07-Mar-1941, 80 y.o.   MRN: [id number] [facility] MD Progress Note  04/29/2021 4:12 PM DURON MEISTER  MRN:  [id number] Subjective:  Feeling good Principal Problem: Mild neurocognitive disorde  Today the patient is seen with his wife [name].  He is 100% of his baseline.  He has had 3 falls.  In some ways his mood is good.  He is able to joke with his wife.  His wife is home more because she is partially retired.  The patient recently had a Tegretol blood level that was in the normal range.  Generally his blood work is good.  The patient says he is sleeping and eating pretty well.  His appetite is down just a little bit.  The biggest stress however is the fact that he had some blood in his urine and had cystoscopy scheduled in the next week or 2.  He had a CAT scan that showed evidence of a polyp.  The patient has no real evidence of being depressed in a meaningful way.  He is not really manic either.  He takes an aspirin a day.  He is not having any acute neurological symptoms.  His falls are described because he is moving too fast.  He does not really feel oversedated in any way.  He denies any chest pain or shortness of breath.  His anxiety is well controlled.  He gets a good night's sleep taking Lunesta. 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   Mild, related to stroke  history   Nocturia    Renal insufficiency 06/25/2013   S/P carotid endarterectomy    BILATERAL ICA--  PATENT PER DUPLEX  05-19-2012   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   Right calf   Urinary frequency    Vitamin D deficiency     Past Surgical History:  Procedure Laterality Date   APPENDECTOMY  AS CHILD   CARDIOVASCULAR STRESS TEST  03-27-2012  DR CRENSHAW   LOW RISK LEXISCAN STUDY-- PROBABLE NORMAL PERFUSION AND SOFT TISSUE ATTENUATION/  NO ISCHEMIA/ EF 51%   CAROTID ENDARTERECTOMY Bilateral LEFT  11-12-2008  DR GREG HAYES   RIGHT ICA  2006  ([facility])   CYSTOSCOPY WITH LITHOLAPAXY N/A 02/26/2013   Procedure: CYSTOSCOPY WITH LITHOLAPAXY;  Surgeon: Franchot Gallo, MD;  Location: [facility];  Service: Urology;  Laterality: N/A;   EYE SURGERY  Jan. 2016   cataract surgery both eyes   INGUINAL HERNIA REPAIR Right 11-08-2006   IR KYPHO EA ADDL LEVEL THORACIC OR LUMBAR  02/12/2021   IR RADIOLOGIST EVAL & MGMT  02/18/2021   MASS EXCISION N/A 03/03/2016   Procedure: EXCISION OF BACK  MASS;  Surgeon: Stark Klein, MD;  Location: [facility];  Service: General;  Laterality: N/A;   MOHS SURGERY Left 1/ 2016   Dr Nevada Crane-- Basal cell   PROSTATE SURGERY     TRANSURETHRAL RESECTION OF PROSTATE N/A 02/26/2013   Procedure: TRANSURETHRAL RESECTION OF THE PROSTATE WITH GYRUS INSTRUMENTS;  Surgeon: Franchot Gallo, MD;  Location: [facility];  Service: Urology;  Laterality: N/A;   Family History:  Family History  Problem Relation Age of Onset   Heart disease Mother        CHF   Bipolar disorder Mother    Heart disease Father        CHF   Family Psychiatric  History:  Social History:  Social History   Substance and Sexual Activity  Alcohol Use Yes   Alcohol/week: 0.0 standard drinks   Comment: Occasional     Social History   Substance and Sexual Activity  Drug Use No    Social History    Socioeconomic History   Marital status: Married    Spouse name: Not on file   Number of children: 2   Years of education: 12   Highest education level: High school graduate  Occupational History    Employer: Retired  Tobacco Use   Smoking status: Former    Packs/day: 2.00    Years: 40.00    Pack years: 80.00    Types: Cigarettes    Quit date: 02/15/2005    Years since quitting: 16.2   Smokeless tobacco: Never  Vaping Use   Vaping Use: Never used  Substance and Sexual Activity   Alcohol use: Yes    Alcohol/week: 0.0 standard drinks    Comment: Occasional   Drug use: No   Sexual activity: Yes    Partners: Female  Other Topics Concern   Not on file  Social History Narrative   Exercise--  Walks dogs everyday      LIves with wife , no stairs in home, caffeine - one cup coffee day, exercise - not much, Right handed, 12th grade, retired      One story home   Social Determinants of Health   Financial Resource Strain: Not on file  Food Insecurity: Not on file  Transportation Needs: Not on file  Physical Activity: Not on file  Stress: Not on file  Social Connections: Not on file   Additional Social History:                         Sleep: Good  Appetite:  Good  Current Medications: Current Outpatient Medications  Medication Sig Dispense Refill   acetaminophen (TYLENOL) 500 MG tablet Take 500 mg by mouth every 6 (six) hours as needed (1 tab as needed).     amLODipine (NORVASC) 10 MG tablet TAKE 1 TABLET BY MOUTH DAILY 90 tablet 3   amLODipine (NORVASC) 10 MG tablet Take 1 tablet by mouth daily 90 tablet 3   amLODipine (NORVASC) 5 MG tablet TAKE 1 TABLET (5 MG TOTAL) BY MOUTH DAILY. 90 tablet 1   aspirin 325 MG EC tablet Take 325 mg by mouth daily.     carbamazepine (TEGRETOL XR) 100 MG 12 hr tablet TAKE 1 TABLET BY MOUTH EVERY MORNING AND 2 AT BEDTIME 270 tablet 1   Cholecalciferol (VITAMIN D) 2000 UNITS CAPS Take by mouth.     COVID-19 mRNA bivalent  vaccine, Pfizer, injection Inject into the muscle. 0.3 mL 0   COVID-19 mRNA Vac-TriS, Pfizer, (PFIZER-BIONT COVID-19 VAC-TRIS) SUSP injection  Inject into the muscle. 0.3 mL 1   docusate sodium (COLACE) 100 MG capsule Take 100 mg by mouth daily.     Eszopiclone 3 MG TABS TAKE 1 TABLET BY MOUTH IMMEDIATELY BEFORE BEDTIME AS NEEDED 90 tablet 2   famotidine (PEPCID) 20 MG tablet Take 20 mg by mouth 2 (two) times daily.     fenofibrate 160 MG tablet Take 1 tablet (160 mg total) by mouth daily. 90 tablet 1   Flaxseed, Linseed, (FLAX SEED OIL PO) Take by mouth daily.     fluticasone (CUTIVATE) 0.05 % cream   3   influenza vaccine adjuvanted (FLUAD) 0.5 ML injection Inject into the muscle. 0.5 mL 0   lisinopril-hydrochlorothiazide (ZESTORETIC) 10-12.5 MG tablet Take 1 tablet by mouth daily.     lisinopril-hydrochlorothiazide (ZESTORETIC) 10-12.5 MG tablet TAKE 1 TABLET BY MOUTH DAILY 30 tablet 5   lisinopril-hydrochlorothiazide (ZESTORETIC) 10-12.5 MG tablet Take 1 tablet by mouth daily 30 tablet 3   LORazepam (ATIVAN) 0.5 MG tablet Take 2 tablets every mornig and 1 tablet at bedtime. 90 tablet 5   Multiple Vitamin (MULTIVITAMIN) tablet Take 1 tablet by mouth daily.     NONFORMULARY OR COMPOUNDED ITEM [facility]:  Antifungal topical - Terbinafine 3%, Fluconazole 2%, Tea Tree Oil 5%, Urea 10/%,, Ibuprofen 2% in DMSO Suspension #72ml. Apply to the affected nail(s) once (at bedtime) or twice daily. 100 each 2   ondansetron (ZOFRAN ODT) 4 MG disintegrating tablet Take 1 tablet (4 mg total) by mouth every 8 (eight) hours as needed for nausea or vomiting. 20 tablet 0   pantoprazole (PROTONIX) 40 MG tablet TAKE 1 TABLET (40 MG TOTAL) BY MOUTH 2 (TWO) TIMES DAILY BEFORE A MEAL. 60 tablet 2   sildenafil (VIAGRA) 100 MG tablet Take 1 tablet (100 mg total) by mouth as needed up to once daily. 10 tablet 11   tamsulosin (FLOMAX) 0.4 MG CAPS capsule TAKE 1 CAPSULE BY MOUTH ONCE DAILY 90 capsule 3   tiZANidine  (ZANAFLEX) 4 MG tablet Take 1 tablet (4 mg total) by mouth every 6 (six) hours as needed for muscle spasms. 30 tablet 0   traMADol (ULTRAM) 50 MG tablet Take 1 - 2 tablets by mouth every 6 hours as needed for pain 40 tablet 0   traMADol (ULTRAM) 50 MG tablet Take 1 tablet by mouth 3 times a day as needed. 30 tablet 0   triamcinolone cream (KENALOG) 0.1 % Apply 1 application topically 2 (two) times daily. 30 g 0   No current facility-administered medications for this visit.    Lab Results:  No results found for this or any previous visit (from the past 48 hour(s)).  Physical Findings: AIMS:  , ,  ,  ,    CIWA:    COWS:     Musculoskeletal: Strength & Muscle Tone: within normal limits Gait & Station: normal Patient leans: Right  Psychiatric Specialty Exam: ROS  There were no vitals taken for this visit.There is no height or weight on file to calculate BMI.  General Appearance: Casual  Eye Contact::  Good  Speech:  Clear and Coherent  Volume:  Normal  Mood:  Dysphoric and Euthymic  Affect:  Appropriate  Thought Process:  Coherent  Orientation:  Full (Time, Place, and Person)  Thought Content:  WDL  Suicidal Thoughts:  No  Homicidal Thoughts:  No  Memory:  NA  Judgement:  Good  Insight:  Fair  Psychomotor Activity:  Normal  Concentration:  Good  Recall:  Good  Fund of Knowledge:Good  Language: Good  Akathisia:  No  Handed:  Right  AIMS (if indicated):     Assets:  Desire for Improvement  ADL's:  Intact  Cognition: WNL  Sleep:      Treatment Plan Summary: 04/29/2021, 4:12 PM This patient is diagnosed with vascular dementia.  He takes Tegretol 200 mg 1 in the morning and 2 at night.  It is not affecting his balance.  The patient is actually in physical therapy now to deal with issues with balance.  Today we will go ahead and reduce his Ativan to taking 0.5 mg 2 in the morning and 1 at night.  His second problem is insomnia.  He will continue taking Lunesta 3 mg at night  which works really well.  His wife thinks he is doing fairly well.  He does have episodes where he seems to be disoriented and seems to lose track of exactly what he is doing.  Together they joke with each other but I think they are not having any significant conflicts.  This patient will return to see me in 3 months.

## 2021-04-30 ENCOUNTER — Other Ambulatory Visit (HOSPITAL_BASED_OUTPATIENT_CLINIC_OR_DEPARTMENT_OTHER): Payer: Self-pay

## 2021-05-11 ENCOUNTER — Encounter: Payer: Self-pay | Admitting: Physical Therapy

## 2021-05-11 ENCOUNTER — Ambulatory Visit: Payer: PPO | Admitting: Physical Therapy

## 2021-05-11 ENCOUNTER — Other Ambulatory Visit: Payer: Self-pay

## 2021-05-11 DIAGNOSIS — R2689 Other abnormalities of gait and mobility: Secondary | ICD-10-CM

## 2021-05-11 DIAGNOSIS — M545 Low back pain, unspecified: Secondary | ICD-10-CM

## 2021-05-11 DIAGNOSIS — R278 Other lack of coordination: Secondary | ICD-10-CM

## 2021-05-11 DIAGNOSIS — I69354 Hemiplegia and hemiparesis following cerebral infarction affecting left non-dominant side: Secondary | ICD-10-CM | POA: Diagnosis not present

## 2021-05-11 DIAGNOSIS — R262 Difficulty in walking, not elsewhere classified: Secondary | ICD-10-CM

## 2021-05-11 DIAGNOSIS — M6281 Muscle weakness (generalized): Secondary | ICD-10-CM

## 2021-05-11 DIAGNOSIS — R296 Repeated falls: Secondary | ICD-10-CM

## 2021-05-11 NOTE — Therapy (Signed)
[place] [place], [name], [address] Phone: [phone]   Fax:  [phone]  Physical Therapy Treatment  Patient Details  Name: William [name] MRN: [id number] Date of Birth: Sep 16, 1940 Referring Provider (PT): Etter Sjogren   Encounter Date: 05/11/2021   PT End of Session - 05/11/21 1424     Visit Number 17    Date for PT Re-Evaluation 05/26/21    PT Start Time 1308    PT Stop Time 1102    PT Time Calculation (min) 56 min    Activity Tolerance Patient tolerated treatment well;Patient limited by pain    Behavior During Therapy [facility] for tasks assessed/performed             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   Mild, related to stroke history   Nocturia    Renal insufficiency 06/25/2013   S/P carotid endarterectomy    BILATERAL ICA--  PATENT PER DUPLEX  05-19-2012   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   Right calf   Urinary frequency    Vitamin D deficiency     Past Surgical History:  Procedure Laterality Date   APPENDECTOMY  AS CHILD   CARDIOVASCULAR STRESS TEST  03-27-2012  DR CRENSHAW   LOW RISK LEXISCAN STUDY-- PROBABLE NORMAL PERFUSION AND SOFT TISSUE ATTENUATION/  NO ISCHEMIA/ EF 51%   CAROTID ENDARTERECTOMY Bilateral LEFT  11-12-2008  DR GREG HAYES   RIGHT ICA  2006  ([facility])    CYSTOSCOPY WITH LITHOLAPAXY N/A 02/26/2013   Procedure: CYSTOSCOPY WITH LITHOLAPAXY;  Surgeon: Franchot Gallo, MD;  Location: [facility];  Service: Urology;  Laterality: N/A;   EYE SURGERY  Jan. 2016   cataract surgery both eyes   INGUINAL HERNIA REPAIR Right 11-08-2006   IR KYPHO EA ADDL LEVEL THORACIC OR LUMBAR  02/12/2021   IR RADIOLOGIST EVAL & MGMT  02/18/2021   MASS EXCISION N/A 03/03/2016   Procedure: EXCISION OF BACK  MASS;  Surgeon: Stark Klein, MD;  Location: [place];  Service: General;  Laterality: N/A;   MOHS SURGERY Left 1/ 2016   Dr Nevada Crane-- Basal cell   PROSTATE SURGERY     TRANSURETHRAL RESECTION OF PROSTATE N/A 02/26/2013   Procedure: TRANSURETHRAL RESECTION OF THE PROSTATE WITH GYRUS INSTRUMENTS;  Surgeon: Franchot Gallo, MD;  Location[facility];  Service: Urology;  Laterality: N/A;    There were no vitals filed for this visit.   Subjective Assessment - 05/11/21 1316     Subjective Patient flew to [place] over Thanksgiving.  Reports that he is sore in the back today, he is unsure of when he sees the MD.    Currently in Pain? Yes    Pain Score 6  Pain Location Back    Pain Orientation Lower    Aggravating Factors  sitting                               [facility] Adult PT Treatment/Exercise - 05/11/21 0001       Ambulation/Gait   Gait Comments gait outside, up 2 curbs, down 3 curbs needed a rest break , required cues to stand up, take big steps and for arm swing.      High Level Balance   High Level Balance Comments on firm airex cone toe touches him using a SPC, ball toss and cone pick ups      Lumbar Exercises: Aerobic   UBE (Upper Arm Bike) Level 3 x 3 minutes    Nustep level 5 x 6 minutes      Lumbar Exercises: Standing   Other Standing Lumbar Exercises hip abduction, extension and marching 2.5# all 2x10    Other Standing Lumbar Exercises toe raises holding on to walker      Lumbar  Exercises: Seated   Long Arc Quad on Chair Left;3 sets;10 reps    Other Seated Lumbar Exercises ball b/n knees squeeze and then red tband hip abduction, red tband rows and extension, pelvic tilts, marches 2#    Other Seated Lumbar Exercises left DF 3x10 focus on the movement      Moist Heat Therapy   Number Minutes Moist Heat 10 Minutes    Moist Heat Location Lumbar Spine      Electrical Stimulation   Electrical Stimulation Location lumbar spine    Electrical Stimulation Action IFC    Electrical Stimulation Parameters sitting    Electrical Stimulation Goals Pain                       PT Short Term Goals - 04/29/21 1434       PT SHORT TERM GOAL #1   Title independent with initial HEP    Status Achieved               PT Long Term Goals - 05/11/21 1427       PT LONG TERM GOAL #2   Title decrease TUG time to 13 seconds    Status Partially Met                   Plan - 05/11/21 1424     Clinical Impression Statement Patient travelled for the holiday, he reports no real issues used w/c's in the airport and tried to be safe, no falls or stumbles but has some increase of back pain.  He did very well with the exercises, the biggest issue was the walking, he gets fatigued easily and needs rest.  He still struggles with shuffling and no arm swing, can correct some with cues    PT Next Visit Plan will add exercises and see what the MD does for the LBP    Consulted and Agree with Plan of Care Patient             Patient will benefit from skilled therapeutic intervention in order to improve the following deficits and impairments:  Abnormal gait, Decreased coordination, Decreased range of motion, Difficulty walking, Increased muscle spasms, Cardiopulmonary status limiting activity, Decreased endurance, Decreased activity tolerance, Pain, Impaired flexibility, Decreased balance, Decreased mobility, Decreased strength, Postural dysfunction  Visit  Diagnosis: Hemiplegia and hemiparesis following cerebral infarction affecting left non-dominant  side ([place])  Other lack of coordination  Difficulty in walking, not elsewhere classified  Muscle weakness (generalized)  Repeated falls  Other abnormalities of gait and mobility  Acute bilateral low back pain without sciatica     Problem List Patient Active Problem List   Diagnosis Date Noted   Closed fracture of first lumbar vertebra with routine healing 02/03/2021   Closed fracture of multiple ribs 11/18/2020   Depression with anxiety 01/29/2020   Leg pain, bilateral 01/29/2020   Ingrown toenail 07/13/2019   Lumbar spondylosis 05/02/2018   Pain in left knee 03/09/2018   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   Trochanteric bursitis of left hip 01/16/2018   Preventative health care 09/26/2017   HTN (hypertension) 07/19/2015   Hyperlipidemia 07/19/2015   Great toe pain 02/11/2014   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 [place], [name], [address] Phone: [phone]   Fax:  [phone]  Name: William [name] MRN: [id number] Date of Birth: 1941-03-14

## 2021-05-13 ENCOUNTER — Encounter: Payer: Self-pay | Admitting: Physical Therapy

## 2021-05-13 ENCOUNTER — Other Ambulatory Visit: Payer: Self-pay

## 2021-05-13 ENCOUNTER — Ambulatory Visit: Payer: PPO | Admitting: Physical Therapy

## 2021-05-13 DIAGNOSIS — M6281 Muscle weakness (generalized): Secondary | ICD-10-CM

## 2021-05-13 DIAGNOSIS — C672 Malignant neoplasm of lateral wall of bladder: Secondary | ICD-10-CM | POA: Diagnosis not present

## 2021-05-13 DIAGNOSIS — N401 Enlarged prostate with lower urinary tract symptoms: Secondary | ICD-10-CM | POA: Diagnosis not present

## 2021-05-13 DIAGNOSIS — R3914 Feeling of incomplete bladder emptying: Secondary | ICD-10-CM | POA: Diagnosis not present

## 2021-05-13 DIAGNOSIS — R2689 Other abnormalities of gait and mobility: Secondary | ICD-10-CM

## 2021-05-13 DIAGNOSIS — I69354 Hemiplegia and hemiparesis following cerebral infarction affecting left non-dominant side: Secondary | ICD-10-CM

## 2021-05-13 DIAGNOSIS — R278 Other lack of coordination: Secondary | ICD-10-CM

## 2021-05-13 DIAGNOSIS — R262 Difficulty in walking, not elsewhere classified: Secondary | ICD-10-CM

## 2021-05-13 DIAGNOSIS — R3912 Poor urinary stream: Secondary | ICD-10-CM | POA: Diagnosis not present

## 2021-05-13 DIAGNOSIS — R296 Repeated falls: Secondary | ICD-10-CM

## 2021-05-13 DIAGNOSIS — M545 Low back pain, unspecified: Secondary | ICD-10-CM

## 2021-05-13 NOTE — Therapy (Signed)
[place] [place], [name], [address] Phone: [phone]   Fax:  [phone]  Physical Therapy Treatment  Patient Details  Name: William [name] MRN: [id number] Date of Birth: 07-Mar-1941 Referring Provider (PT): Etter Sjogren   Encounter Date: 05/13/2021   PT End of Session - 05/13/21 1346     Visit Number 18    Date for PT Re-Evaluation 05/26/21    PT Start Time 9924    PT Stop Time 2683    PT Time Calculation (min) 52 min    Activity Tolerance Patient tolerated treatment well;Patient limited by pain    Behavior During Therapy [facility] for tasks assessed/performed             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   Mild, related to stroke history   Nocturia    Renal insufficiency 06/25/2013   S/P carotid endarterectomy    BILATERAL ICA--  PATENT PER DUPLEX  05-19-2012   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   Right calf   Urinary frequency    Vitamin D deficiency     Past Surgical History:  Procedure Laterality Date   APPENDECTOMY  AS CHILD   CARDIOVASCULAR STRESS TEST  03-27-2012  DR CRENSHAW   LOW RISK LEXISCAN STUDY-- PROBABLE NORMAL PERFUSION AND SOFT TISSUE ATTENUATION/  NO ISCHEMIA/ EF 51%   CAROTID ENDARTERECTOMY Bilateral LEFT  11-12-2008  DR GREG HAYES   RIGHT ICA  2006  ([facility])    CYSTOSCOPY WITH LITHOLAPAXY N/A 02/26/2013   Procedure: CYSTOSCOPY WITH LITHOLAPAXY;  Surgeon: Franchot Gallo, MD;  Location: [facility];  Service: Urology;  Laterality: N/A;   EYE SURGERY  Jan. 2016   cataract surgery both eyes   INGUINAL HERNIA REPAIR Right 11-08-2006   IR KYPHO EA ADDL LEVEL THORACIC OR LUMBAR  02/12/2021   IR RADIOLOGIST EVAL & MGMT  02/18/2021   MASS EXCISION N/A 03/03/2016   Procedure: EXCISION OF BACK  MASS;  Surgeon: Stark Klein, MD;  Location: [place];  Service: General;  Laterality: N/A;   MOHS SURGERY Left 1/ 2016   Dr Nevada Crane-- Basal cell   PROSTATE SURGERY     TRANSURETHRAL RESECTION OF PROSTATE N/A 02/26/2013   Procedure: TRANSURETHRAL RESECTION OF THE PROSTATE WITH GYRUS INSTRUMENTS;  Surgeon: Franchot Gallo, MD;  Location[facility];  Service: Urology;  Laterality: N/A;    There were no vitals filed for this visit.   Subjective Assessment - 05/13/21 1311     Subjective Comes in with a SPC, he got last week, will have an MRI next week for the back    Currently in Pain? Yes    Pain Score 4     Pain Location Back  Pain Orientation Lower    Pain Relieving Factors the treatment helped                               [facility] Adult PT Treatment/Exercise - 05/13/21 0001       Ambulation/Gait   Gait Comments with SPC x 200 feet x2 , cues to swing the left arm, tends to hold it on his abdomen, still shuffles, cues for bigger steps and heel strike      High Level Balance   High Level Balance Activities Negotiating over obstacles    High Level Balance Comments on firm airex cone toe touches him using a SPC, ball toss and cone pick ups, cone toe touches on solid surface with SPC      Lumbar Exercises: Aerobic   UBE (Upper Arm Bike) Level 3 x 3 minutes    Nustep level 5 x 6 minutes      Lumbar Exercises: Standing   Heel Raises 20 reps;1 second    Other Standing Lumbar Exercises hip  abduction, extension and marching 2.5# all 2x10      Lumbar Exercises: Seated   Long Arc Quad on Chair Left;3 sets;10 reps    LAQ on Chair Weights (lbs) 3    Other Seated Lumbar Exercises ball b/n knees squeeze, red tband rows and extension shoulders, pelvic tilts, marches 2#                       PT Short Term Goals - 04/29/21 1434       PT SHORT TERM GOAL #1   Title independent with initial HEP    Status Achieved               PT Long Term Goals - 05/13/21 1348       PT LONG TERM GOAL #2   Title decrease TUG time to 13 seconds    Status Partially Met      PT LONG TERM GOAL #3   Title walk around our building without rest     Status On-going      PT LONG TERM GOAL #4   Title decrease pain with walking by 50%    Status On-going                   Plan - 05/13/21 1346     Clinical Impression Statement Patient using a SPC, we talked about this, his daughter bought it for him last week.  He needs cues for the step length and for the left arm swing.  He did well with sequencing.  Demonstrated good balance today using the [facility] for the balance activities,   He is still limited with walking and standing activites because of pain    PT Next Visit Plan will add exercises and see what the MD does for the LBP    Consulted and Agree with Plan of Care Patient             Patient will benefit from skilled therapeutic intervention in order to improve the following deficits and impairments:  Abnormal gait, Decreased coordination, Decreased range of motion, Difficulty walking, Increased muscle spasms, Cardiopulmonary status limiting activity, Decreased endurance, Decreased activity tolerance, Pain, Impaired flexibility, Decreased balance, Decreased mobility, Decreased strength, Postural dysfunction  Visit Diagnosis: Hemiplegia and hemiparesis following cerebral infarction affecting left non-dominant side (HCC)  Other lack of coordination  Difficulty in  walking, not  elsewhere classified  Muscle weakness (generalized)  Repeated falls  Other abnormalities of gait and mobility  Acute bilateral low back pain without sciatica     Problem List Patient Active Problem List   Diagnosis Date Noted   Closed fracture of first lumbar vertebra with routine healing 02/03/2021   Closed fracture of multiple ribs 11/18/2020   Depression with anxiety 01/29/2020   Leg pain, bilateral 01/29/2020   Ingrown toenail 07/13/2019   Lumbar spondylosis 05/02/2018   Pain in left knee 03/09/2018   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   Trochanteric bursitis of left hip 01/16/2018   Preventative health care 09/26/2017   HTN (hypertension) 07/19/2015   Hyperlipidemia 07/19/2015   Great toe pain 02/11/2014   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 [place], [name], [address] Phone: [phone]   Fax:  [phone]  Name: William [name] MRN: [id number] Date of Birth: 06-Dec-1940

## 2021-05-14 DIAGNOSIS — M5451 Vertebrogenic low back pain: Secondary | ICD-10-CM | POA: Diagnosis not present

## 2021-05-18 ENCOUNTER — Encounter: Payer: PPO | Admitting: Physical Therapy

## 2021-05-18 ENCOUNTER — Other Ambulatory Visit: Payer: Self-pay | Admitting: Urology

## 2021-05-19 MED ORDER — GEMCITABINE CHEMO FOR BLADDER INSTILLATION 2000 MG: 2000.0000 mg | Freq: Once | INTRAVENOUS | Status: AC

## 2021-05-20 ENCOUNTER — Ambulatory Visit: Payer: PPO | Attending: Family Medicine | Admitting: Physical Therapy

## 2021-05-20 ENCOUNTER — Encounter: Payer: Self-pay | Admitting: Physical Therapy

## 2021-05-20 ENCOUNTER — Other Ambulatory Visit: Payer: Self-pay

## 2021-05-20 DIAGNOSIS — R296 Repeated falls: Secondary | ICD-10-CM | POA: Diagnosis not present

## 2021-05-20 DIAGNOSIS — R2689 Other abnormalities of gait and mobility: Secondary | ICD-10-CM | POA: Insufficient documentation

## 2021-05-20 DIAGNOSIS — M6281 Muscle weakness (generalized): Secondary | ICD-10-CM | POA: Insufficient documentation

## 2021-05-20 DIAGNOSIS — I69354 Hemiplegia and hemiparesis following cerebral infarction affecting left non-dominant side: Secondary | ICD-10-CM | POA: Diagnosis not present

## 2021-05-20 DIAGNOSIS — R278 Other lack of coordination: Secondary | ICD-10-CM | POA: Diagnosis not present

## 2021-05-20 DIAGNOSIS — M47817 Spondylosis without myelopathy or radiculopathy, lumbosacral region: Secondary | ICD-10-CM | POA: Diagnosis not present

## 2021-05-20 DIAGNOSIS — R262 Difficulty in walking, not elsewhere classified: Secondary | ICD-10-CM | POA: Insufficient documentation

## 2021-05-20 DIAGNOSIS — M545 Low back pain, unspecified: Secondary | ICD-10-CM | POA: Insufficient documentation

## 2021-05-20 NOTE — Therapy (Signed)
[address]. [place], [name], [address] Phone: [phone]   Fax:  [phone]  Physical Therapy Treatment  Patient Details  Name: William [name] MRN: [id number] Date of Birth: [birth year] Referring Provider (PT): Etter Sjogren   Encounter Date: 05/20/2021   PT End of Session - 05/20/21 1358     Visit Number 19    Date for PT Re-Evaluation 05/26/21    PT Start Time 8016    PT Stop Time 5537    PT Time Calculation (min) 44 min    Activity Tolerance Patient tolerated treatment well;Patient limited by pain    Behavior During Therapy 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   Mild, related to stroke history   Nocturia    Renal insufficiency 06/25/2013   S/P carotid endarterectomy    BILATERAL ICA--  PATENT PER DUPLEX  05-19-2012   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   Right calf   Urinary frequency    Vitamin D deficiency     Past Surgical History:  Procedure Laterality Date   APPENDECTOMY  AS CHILD   CARDIOVASCULAR STRESS TEST  03-27-2012  DR CRENSHAW   LOW RISK LEXISCAN STUDY-- PROBABLE NORMAL PERFUSION AND SOFT TISSUE ATTENUATION/  NO ISCHEMIA/ EF 51%   CAROTID ENDARTERECTOMY Bilateral LEFT  11-12-2008  DR GREG HAYES   RIGHT ICA  2006  ([facility])    CYSTOSCOPY WITH LITHOLAPAXY N/A 02/26/2013   Procedure: CYSTOSCOPY WITH LITHOLAPAXY;  Surgeon: Franchot Gallo, MD;  Location: [facility];  Service: Urology;  Laterality: N/A;   EYE SURGERY  Jan. 2016   cataract surgery both eyes   INGUINAL HERNIA REPAIR Right 11-08-2006   IR KYPHO EA ADDL LEVEL THORACIC OR LUMBAR  02/12/2021   IR RADIOLOGIST EVAL & MGMT  02/18/2021   MASS EXCISION N/A 03/03/2016   Procedure: EXCISION OF BACK  MASS;  Surgeon: Stark Klein, MD;  Location: [place];  Service: General;  Laterality: N/A;   MOHS SURGERY Left 1/ 2016   Dr Nevada Crane-- Basal cell   PROSTATE SURGERY     TRANSURETHRAL RESECTION OF PROSTATE N/A 02/26/2013   Procedure: TRANSURETHRAL RESECTION OF THE PROSTATE WITH GYRUS INSTRUMENTS;  Surgeon: Franchot Gallo, MD;  Location: [facility];  Service: Urology;  Laterality: N/A;    There were no vitals filed for this visit.   Subjective Assessment - 05/20/21 1317     Subjective Saw the PA for the back reports still a fracture in the back.  Reports no falls, he reports that he has been diagnosed with some bladder cancer and he will have surgery in January    Currently in Pain?  Yes    Pain Score 4     Pain Location Back    Pain Orientation Lower    Aggravating Factors  I don't sleep well                [facility] PT Assessment - 05/20/21 0001       Timed Up and Go Test   Normal TUG (seconds) 12    TUG Comments no device                           [facility] Adult PT Treatment/Exercise - 05/20/21 0001       Ambulation/Gait   Gait Comments gait 200 feet no device cues for arm swing and for bigger steps      High Level Balance   High Level Balance Activities Figure 8 turns    High Level Balance Comments on airex balance beam side stepping and tandem walking, with SPC 8" toe touches side stepping over an object      Lumbar Exercises: Aerobic   UBE (Upper Arm Bike) Level 3 x 3 minutes     Nustep level 5 x 6 minutes      Lumbar Exercises: Machines for Strengthening   Other Lumbar Machine Exercise lats and seated row focus on the left 20# 2x10      Lumbar Exercises: Standing   Other Standing Lumbar Exercises hip abduction, extension and marching 2.5# all 2x10      Lumbar Exercises: Seated   Long Arc Quad on Chair Left;3 sets;10 reps    LAQ on Chair Weights (lbs) 3                       PT Short Term Goals - 04/29/21 1434       PT SHORT TERM GOAL #1   Title independent with initial HEP    Status Achieved               PT Long Term Goals - 05/20/21 1400       PT LONG TERM GOAL #2   Title decrease TUG time to 13 seconds    Status Partially Met      PT LONG TERM GOAL #3   Title walk around our building without rest     Status On-going      PT LONG TERM GOAL #4   Title decrease pain with walking by 50%    Status On-going                   Plan - 05/20/21 1358     Clinical Impression Statement TUG has decreased from 18 seconds to 12 seconds, he still needs some cues for the steps length and the arm swing, when tired he shuffles, he reports some back pain with the LAQ, has some dizziness with turns and figure 8's    PT Next Visit Plan will add exercises and see what the MD does for the LBP    Consulted and Agree with Plan of Care Patient             Patient will benefit from skilled therapeutic intervention in order to improve the following deficits and impairments:  Abnormal gait, Decreased coordination, Decreased range of motion, Difficulty walking, Increased muscle spasms, Cardiopulmonary status limiting activity, Decreased endurance, Decreased activity tolerance, Pain, Impaired flexibility, Decreased balance, Decreased mobility, Decreased strength, Postural dysfunction  Visit Diagnosis: Hemiplegia and hemiparesis following cerebral infarction affecting  left non-dominant side ([place])  Other lack of coordination  Difficulty  in walking, not elsewhere classified  Muscle weakness (generalized)  Repeated falls  Other abnormalities of gait and mobility     Problem List Patient Active Problem List   Diagnosis Date Noted   Closed fracture of first lumbar vertebra with routine healing 02/03/2021   Closed fracture of multiple ribs 11/18/2020   Depression with anxiety 01/29/2020   Leg pain, bilateral 01/29/2020   Ingrown toenail 07/13/2019   Lumbar spondylosis 05/02/2018   Pain in left knee 03/09/2018   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   Trochanteric bursitis of left hip 01/16/2018   Preventative health care 09/26/2017   HTN (hypertension) 07/19/2015   Hyperlipidemia 07/19/2015   Great toe pain 02/11/2014   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 [place], [name], [address] Phone: [phone]   Fax:  [phone]  Name: DURWOOD DITTUS MRN: [id number] Date of Birth: 12/08/1940

## 2021-05-25 ENCOUNTER — Other Ambulatory Visit (HOSPITAL_BASED_OUTPATIENT_CLINIC_OR_DEPARTMENT_OTHER): Payer: Self-pay

## 2021-05-25 MED ORDER — TAMSULOSIN HCL 0.4 MG PO CAPS
0.4000 mg | ORAL_CAPSULE | Freq: Two times a day (BID) | ORAL | 1 refills | Status: DC
  Filled 2021-05-25 (×2): qty 60, 30d supply, fill #0

## 2021-05-26 DIAGNOSIS — N189 Chronic kidney disease, unspecified: Secondary | ICD-10-CM | POA: Diagnosis not present

## 2021-05-26 DIAGNOSIS — E559 Vitamin D deficiency, unspecified: Secondary | ICD-10-CM | POA: Diagnosis not present

## 2021-05-26 DIAGNOSIS — D631 Anemia in chronic kidney disease: Secondary | ICD-10-CM | POA: Diagnosis not present

## 2021-05-26 DIAGNOSIS — N400 Enlarged prostate without lower urinary tract symptoms: Secondary | ICD-10-CM | POA: Diagnosis not present

## 2021-05-26 DIAGNOSIS — I129 Hypertensive chronic kidney disease with stage 1 through stage 4 chronic kidney disease, or unspecified chronic kidney disease: Secondary | ICD-10-CM | POA: Diagnosis not present

## 2021-05-26 DIAGNOSIS — C679 Malignant neoplasm of bladder, unspecified: Secondary | ICD-10-CM | POA: Diagnosis not present

## 2021-05-26 DIAGNOSIS — N1832 Chronic kidney disease, stage 3b: Secondary | ICD-10-CM | POA: Diagnosis not present

## 2021-05-27 ENCOUNTER — Other Ambulatory Visit: Payer: Self-pay

## 2021-05-27 ENCOUNTER — Encounter: Payer: Self-pay | Admitting: Physical Therapy

## 2021-05-27 ENCOUNTER — Ambulatory Visit: Payer: PPO | Admitting: Physical Therapy

## 2021-05-27 DIAGNOSIS — R278 Other lack of coordination: Secondary | ICD-10-CM

## 2021-05-27 DIAGNOSIS — R262 Difficulty in walking, not elsewhere classified: Secondary | ICD-10-CM

## 2021-05-27 DIAGNOSIS — R2689 Other abnormalities of gait and mobility: Secondary | ICD-10-CM

## 2021-05-27 DIAGNOSIS — R296 Repeated falls: Secondary | ICD-10-CM

## 2021-05-27 DIAGNOSIS — M6281 Muscle weakness (generalized): Secondary | ICD-10-CM

## 2021-05-27 DIAGNOSIS — I69354 Hemiplegia and hemiparesis following cerebral infarction affecting left non-dominant side: Secondary | ICD-10-CM

## 2021-05-27 DIAGNOSIS — M545 Low back pain, unspecified: Secondary | ICD-10-CM

## 2021-05-27 NOTE — Therapy (Signed)
[place]. [place], [name], [address] Phone: [phone]   Fax:  [phone] Progress Note Reporting Period 04/01/21 to 05/27/21 for visit 11-20  See note below for Objective Data and Assessment of Progress/Goals.     Physical Therapy Treatment  Patient Details  Name: William [name] MRN: [id number] Date of Birth: 02-Mar-1941 Referring Provider (PT): Etter Sjogren   Encounter Date: 05/27/2021   PT End of Session - 05/27/21 1535     Visit Number 20    Date for PT Re-Evaluation 06/27/21    PT Start Time 1355    PT Stop Time 1440    PT Time Calculation (min) 45 min    Activity Tolerance Patient tolerated treatment well;Patient limited by pain    Behavior During Therapy [facility] for tasks assessed/performed             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   Mild, related to stroke history   Nocturia    Renal insufficiency 06/25/2013   S/P carotid endarterectomy    BILATERAL ICA--  PATENT PER DUPLEX  05-19-2012   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   Right calf   Urinary frequency    Vitamin D deficiency     Past Surgical History:  Procedure Laterality Date   APPENDECTOMY  AS CHILD   CARDIOVASCULAR STRESS TEST  03-27-2012  DR CRENSHAW   LOW RISK LEXISCAN STUDY-- PROBABLE NORMAL PERFUSION AND SOFT  TISSUE ATTENUATION/  NO ISCHEMIA/ EF 51%   CAROTID ENDARTERECTOMY Bilateral LEFT  11-12-2008  DR GREG HAYES   RIGHT ICA  2006  ([facility])   CYSTOSCOPY WITH LITHOLAPAXY N/A 02/26/2013   Procedure: CYSTOSCOPY WITH LITHOLAPAXY;  Surgeon: Franchot Gallo, MD;  Location: [facility];  Service: Urology;  Laterality: N/A;   EYE SURGERY  Jan. 2016   cataract surgery both eyes   INGUINAL HERNIA REPAIR Right 11-08-2006   IR KYPHO EA ADDL LEVEL THORACIC OR LUMBAR  02/12/2021   IR RADIOLOGIST EVAL & MGMT  02/18/2021   MASS EXCISION N/A 03/03/2016   Procedure: EXCISION OF BACK  MASS;  Surgeon: Stark Klein, MD;  Location: [place];  Service: General;  Laterality: N/A;   MOHS SURGERY Left 1/ 2016   Dr Nevada Crane-- Basal cell   PROSTATE SURGERY     TRANSURETHRAL RESECTION OF PROSTATE N/A 02/26/2013   Procedure: TRANSURETHRAL RESECTION OF THE PROSTATE WITH GYRUS INSTRUMENTS;  Surgeon: Franchot Gallo, MD;  Location: [facility];  Service: Urology;  Laterality: N/A;    There were no vitals filed for this visit.   Subjective Assessment - 05/27/21 1401     Subjective No pain, no falls, but I do not feel stable while walking    Currently in  Pain? No/denies                               [facility] Adult PT Treatment/Exercise - 05/27/21 0001       Ambulation/Gait   Gait Comments HHA, fast walking, stairs step over step      High Level Balance   High Level Balance Activities Side stepping;Backward walking;Direction changes;Negotiating over obstacles;Figure 8 turns    High Level Balance Comments ball toss, ball kicks,      Lumbar Exercises: Aerobic   Nustep level 5 x 7 minutes                       PT Short Term Goals - 04/29/21 1434       PT SHORT TERM GOAL #1   Title independent with initial HEP    Status Achieved               PT Long Term Goals - 05/27/21 1540       PT LONG TERM GOAL #2   Title decrease TUG time  to 13 seconds      PT LONG TERM GOAL #3   Title walk around our building without rest     Status On-going      PT LONG TERM GOAL #4   Title decrease pain with walking by 50%    Status Partially Met                   Plan - 05/27/21 1536     Clinical Impression Statement Doing well, he has fears of the surgery coming up, having some anxiety.  He wanted to work on balance today so we did more balance.  He is moving better at times, but tdoes have ups and downs with the need for cues for arm swing and for step length, tends to shuffle when tired, he is having some anterior left flank pain, he thinks it is from the cancer.    PT Next Visit Plan will continue to work on strength, balance and function    Consulted and Agree with Plan of Care Patient             Patient will benefit from skilled therapeutic intervention in order to improve the following deficits and impairments:  Abnormal gait, Decreased coordination, Decreased range of motion, Difficulty walking, Increased muscle spasms, Cardiopulmonary status limiting activity, Decreased endurance, Decreased activity tolerance, Pain, Impaired flexibility, Decreased balance, Decreased mobility, Decreased strength, Postural dysfunction  Visit Diagnosis: Hemiplegia and hemiparesis following cerebral infarction affecting left non-dominant side (HCC) - Plan: PT plan of care cert/re-cert  Other lack of coordination - Plan: PT plan of care cert/re-cert  Difficulty in walking, not elsewhere classified - Plan: PT plan of care cert/re-cert  Muscle weakness (generalized) - Plan: PT plan of care cert/re-cert  Repeated falls - Plan: PT plan of care cert/re-cert  Other abnormalities of gait and mobility - Plan: PT plan of care cert/re-cert  Acute bilateral low back pain without sciatica - Plan: PT plan of care cert/re-cert     Problem List Patient Active Problem List   Diagnosis Date Noted   Closed fracture of first lumbar  vertebra with routine healing 02/03/2021   Closed fracture of multiple ribs 11/18/2020   Depression with anxiety 01/29/2020   Leg pain, bilateral 01/29/2020   Ingrown toenail 07/13/2019   Lumbar spondylosis 05/02/2018   Pain in left knee  03/09/2018   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   Trochanteric bursitis of left hip 01/16/2018   Preventative health care 09/26/2017   HTN (hypertension) 07/19/2015   Hyperlipidemia 07/19/2015   Great toe pain 02/11/2014   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 [place], [name], [address] Phone: [phone]   Fax:  [phone]  Name: William [name] MRN: [id number] Date of Birth: [birth year]

## 2021-06-01 ENCOUNTER — Other Ambulatory Visit (HOSPITAL_BASED_OUTPATIENT_CLINIC_OR_DEPARTMENT_OTHER): Payer: Self-pay

## 2021-06-01 ENCOUNTER — Ambulatory Visit: Payer: PPO | Admitting: Physical Therapy

## 2021-06-01 ENCOUNTER — Encounter: Payer: Self-pay | Admitting: Physical Therapy

## 2021-06-01 ENCOUNTER — Other Ambulatory Visit: Payer: Self-pay

## 2021-06-01 DIAGNOSIS — R262 Difficulty in walking, not elsewhere classified: Secondary | ICD-10-CM

## 2021-06-01 DIAGNOSIS — R2689 Other abnormalities of gait and mobility: Secondary | ICD-10-CM

## 2021-06-01 DIAGNOSIS — I69354 Hemiplegia and hemiparesis following cerebral infarction affecting left non-dominant side: Secondary | ICD-10-CM

## 2021-06-01 DIAGNOSIS — M6281 Muscle weakness (generalized): Secondary | ICD-10-CM

## 2021-06-01 DIAGNOSIS — R296 Repeated falls: Secondary | ICD-10-CM

## 2021-06-01 DIAGNOSIS — R278 Other lack of coordination: Secondary | ICD-10-CM

## 2021-06-01 NOTE — Therapy (Signed)
[address] [place], [name], [address] Phone: [phone]   Fax:  [phone]  Physical Therapy Treatment  Patient Details  Name: William [name] MRN: [id number] Date of Birth: Jul 28, 1940 Referring Provider (PT): Etter Sjogren   Encounter Date: 06/01/2021   PT End of Session - 06/01/21 1407     Visit Number 21    Date for PT Re-Evaluation 06/27/21    PT Start Time 7494    PT Stop Time 4967    PT Time Calculation (min) 42 min    Activity Tolerance Patient tolerated treatment well    Behavior During Therapy [facility] for tasks assessed/performed             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   Mild, related to stroke history   Nocturia    Renal insufficiency 06/25/2013   S/P carotid endarterectomy    BILATERAL ICA--  PATENT PER DUPLEX  05-19-2012   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   Right calf   Urinary frequency    Vitamin D deficiency     Past Surgical History:  Procedure Laterality Date   APPENDECTOMY  AS CHILD   CARDIOVASCULAR STRESS TEST  03-27-2012  DR CRENSHAW   LOW RISK LEXISCAN STUDY-- PROBABLE NORMAL PERFUSION AND SOFT TISSUE ATTENUATION/  NO ISCHEMIA/ EF 51%   CAROTID ENDARTERECTOMY Bilateral LEFT  11-12-2008  DR GREG HAYES   RIGHT ICA  2006  ([facility])   CYSTOSCOPY WITH LITHOLAPAXY  N/A 02/26/2013   Procedure: CYSTOSCOPY WITH LITHOLAPAXY;  Surgeon: Franchot Gallo, MD;  Location: [facility];  Service: Urology;  Laterality: N/A;   EYE SURGERY  Jan. 2016   cataract surgery both eyes   INGUINAL HERNIA REPAIR Right 11-08-2006   IR KYPHO EA ADDL LEVEL THORACIC OR LUMBAR  02/12/2021   IR RADIOLOGIST EVAL & MGMT  02/18/2021   MASS EXCISION N/A 03/03/2016   Procedure: EXCISION OF BACK  MASS;  Surgeon: Stark Klein, MD;  Location: [place];  Service: General;  Laterality: N/A;   MOHS SURGERY Left 1/ 2016   Dr Nevada Crane-- Basal cell   PROSTATE SURGERY     TRANSURETHRAL RESECTION OF PROSTATE N/A 02/26/2013   Procedure: TRANSURETHRAL RESECTION OF THE PROSTATE WITH GYRUS INSTRUMENTS;  Surgeon: Franchot Gallo, MD;  Location: [facility];  Service: Urology;  Laterality: N/A;    There were no vitals filed for this visit.   Subjective Assessment - 06/01/21 1321     Subjective I am still hurting in the back, I will see the back doctor tomorrow.    Pain Score 4     Pain Location Back    Pain Orientation Lower    Pain Descriptors / Indicators Aching  Aggravating Factors  standing and walking                               [facility] Adult PT Treatment/Exercise - 06/01/21 0001       Ambulation/Gait   Gait Comments gait outside with SPC, x 200 feet, one rest break      High Level Balance   High Level Balance Activities Side stepping;Backward walking;Negotitating around obstacles;Negotiating over obstacles    High Level Balance Comments ball toss, ball kicks, ball toss on airex, cone toe touches, walking on the big mat for dynamic surfaces      Lumbar Exercises: Aerobic   Nustep level 5 x 7 minutes                       PT Short Term Goals - 04/29/21 1434       PT SHORT TERM GOAL #1   Title independent with initial HEP    Status Achieved               PT Long Term Goals - 05/27/21 1540        PT LONG TERM GOAL #2   Title decrease TUG time to 13 seconds      PT LONG TERM GOAL #3   Title walk around our building without rest     Status On-going      PT LONG TERM GOAL #4   Title decrease pain with walking by 50%    Status Partially Met                   Plan - 06/01/21 1408     Clinical Impression Statement Patient seeing back MD next week, He reports tired with walking and increased back pain, He did well today, much better with the balance today    PT Next Visit Plan will continue to work on strength, balance and function    Consulted and Agree with Plan of Care Patient             Patient will benefit from skilled therapeutic intervention in order to improve the following deficits and impairments:  Abnormal gait, Decreased coordination, Decreased range of motion, Difficulty walking, Increased muscle spasms, Cardiopulmonary status limiting activity, Decreased endurance, Decreased activity tolerance, Pain, Impaired flexibility, Decreased balance, Decreased mobility, Decreased strength, Postural dysfunction  Visit Diagnosis: Hemiplegia and hemiparesis following cerebral infarction affecting left non-dominant side (HCC)  Other lack of coordination  Difficulty in walking, not elsewhere classified  Muscle weakness (generalized)  Repeated falls  Other abnormalities of gait and mobility     Problem List Patient Active Problem List   Diagnosis Date Noted   Closed fracture of first lumbar vertebra with routine healing 02/03/2021   Closed fracture of multiple ribs 11/18/2020   Depression with anxiety 01/29/2020   Leg pain, bilateral 01/29/2020   Ingrown toenail 07/13/2019   Lumbar spondylosis 05/02/2018   Pain in left knee 03/09/2018   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   Trochanteric bursitis of left hip 01/16/2018   Preventative health care 09/26/2017   HTN (hypertension) 07/19/2015   Hyperlipidemia 07/19/2015   Great toe pain 02/11/2014    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 [place], [name], [address] Phone: [phone]   Fax:  [phone]  Name: William [name] MRN: [id number] Date of Birth: 10/25/1940

## 2021-06-02 DIAGNOSIS — M47816 Spondylosis without myelopathy or radiculopathy, lumbar region: Secondary | ICD-10-CM | POA: Diagnosis not present

## 2021-06-03 ENCOUNTER — Ambulatory Visit: Payer: PPO | Admitting: Physical Therapy

## 2021-06-05 ENCOUNTER — Other Ambulatory Visit (HOSPITAL_COMMUNITY): Payer: Self-pay

## 2021-06-05 ENCOUNTER — Other Ambulatory Visit (HOSPITAL_BASED_OUTPATIENT_CLINIC_OR_DEPARTMENT_OTHER): Payer: Self-pay

## 2021-06-05 DIAGNOSIS — R300 Dysuria: Secondary | ICD-10-CM | POA: Diagnosis not present

## 2021-06-05 DIAGNOSIS — R3914 Feeling of incomplete bladder emptying: Secondary | ICD-10-CM | POA: Diagnosis not present

## 2021-06-05 DIAGNOSIS — N401 Enlarged prostate with lower urinary tract symptoms: Secondary | ICD-10-CM | POA: Diagnosis not present

## 2021-06-05 DIAGNOSIS — C672 Malignant neoplasm of lateral wall of bladder: Secondary | ICD-10-CM | POA: Diagnosis not present

## 2021-06-05 DIAGNOSIS — R310 Gross hematuria: Secondary | ICD-10-CM | POA: Diagnosis not present

## 2021-06-05 MED ORDER — CEPHALEXIN 500 MG PO CAPS
ORAL_CAPSULE | ORAL | 0 refills | Status: DC
  Filled 2021-06-05 (×2): qty 14, 7d supply, fill #0

## 2021-06-05 MED ORDER — CEPHALEXIN 500 MG PO CAPS
ORAL_CAPSULE | ORAL | 0 refills | Status: DC
  Filled 2021-06-05: qty 14, 7d supply, fill #0

## 2021-06-09 ENCOUNTER — Other Ambulatory Visit: Payer: Self-pay

## 2021-06-09 ENCOUNTER — Ambulatory Visit: Payer: PPO | Admitting: Physical Therapy

## 2021-06-09 ENCOUNTER — Encounter: Payer: Self-pay | Admitting: Physical Therapy

## 2021-06-09 ENCOUNTER — Other Ambulatory Visit (HOSPITAL_BASED_OUTPATIENT_CLINIC_OR_DEPARTMENT_OTHER): Payer: Self-pay

## 2021-06-09 DIAGNOSIS — M6281 Muscle weakness (generalized): Secondary | ICD-10-CM

## 2021-06-09 DIAGNOSIS — R296 Repeated falls: Secondary | ICD-10-CM

## 2021-06-09 DIAGNOSIS — I69354 Hemiplegia and hemiparesis following cerebral infarction affecting left non-dominant side: Secondary | ICD-10-CM

## 2021-06-09 DIAGNOSIS — R262 Difficulty in walking, not elsewhere classified: Secondary | ICD-10-CM

## 2021-06-09 DIAGNOSIS — R2689 Other abnormalities of gait and mobility: Secondary | ICD-10-CM

## 2021-06-09 DIAGNOSIS — M545 Low back pain, unspecified: Secondary | ICD-10-CM

## 2021-06-09 DIAGNOSIS — R278 Other lack of coordination: Secondary | ICD-10-CM

## 2021-06-09 NOTE — Therapy (Signed)
[address]. [place], [name], [address] Phone: [phone]   Fax:  [phone]  Physical Therapy Treatment  Patient Details  Name: William [name] MRN: [id number] Date of Birth: 1940/10/03 Referring Provider (PT): Etter Sjogren   Encounter Date: 06/09/2021   PT End of Session - 06/09/21 1147     Visit Number 22    Date for PT Re-Evaluation 06/27/21    PT Start Time 1014    PT Stop Time 1100    PT Time Calculation (min) 46 min    Activity Tolerance Patient tolerated treatment well    Behavior During Therapy [facility] for tasks assessed/performed             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   Mild, related to stroke history   Nocturia    Renal insufficiency 06/25/2013   S/P carotid endarterectomy    BILATERAL ICA--  PATENT PER DUPLEX  05-19-2012   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   Right calf   Urinary frequency    Vitamin D deficiency     Past Surgical History:  Procedure Laterality Date   APPENDECTOMY  AS CHILD   CARDIOVASCULAR STRESS TEST  03-27-2012  DR CRENSHAW   LOW RISK LEXISCAN STUDY-- PROBABLE NORMAL PERFUSION AND SOFT TISSUE ATTENUATION/  NO ISCHEMIA/ EF 51%   CAROTID ENDARTERECTOMY Bilateral LEFT  11-12-2008  DR GREG HAYES   RIGHT ICA  2006  ([facility])   CYSTOSCOPY WITH LITHOLAPAXY  N/A 02/26/2013   Procedure: CYSTOSCOPY WITH LITHOLAPAXY;  Surgeon: Franchot Gallo, MD;  Location: [facility];  Service: Urology;  Laterality: N/A;   EYE SURGERY  Jan. 2016   cataract surgery both eyes   INGUINAL HERNIA REPAIR Right 11-08-2006   IR KYPHO EA ADDL LEVEL THORACIC OR LUMBAR  02/12/2021   IR RADIOLOGIST EVAL & MGMT  02/18/2021   MASS EXCISION N/A 03/03/2016   Procedure: EXCISION OF BACK  MASS;  Surgeon: Stark Klein, MD;  Location: [place];  Service: General;  Laterality: N/A;   MOHS SURGERY Left 1/ 2016   Dr Nevada Crane-- Basal cell   PROSTATE SURGERY     TRANSURETHRAL RESECTION OF PROSTATE N/A 02/26/2013   Procedure: TRANSURETHRAL RESECTION OF THE PROSTATE WITH GYRUS INSTRUMENTS;  Surgeon: Franchot Gallo, MD;  Location: [facility];  Service: Urology;  Laterality: N/A;    There were no vitals filed for this visit.   Subjective Assessment - 06/09/21 1020     Subjective Patient reports that he had a fall on Chritmas day.  He reports that he was in a hurry and his arm slipped off the chair, wifed called EMS to get him up    Currently in Pain? Yes    Pain  Score 5     Pain Location Back   hip   Pain Descriptors / Indicators Sore    Aggravating Factors  falling                               [facility] Adult PT Treatment/Exercise - 06/09/21 0001       Transfers   Comments worked on getting up from floor, used large folded up mat, and tried to get him to turn from sitting on bottom, and rolling to side and then up to knees and then to feet      High Level Balance   High Level Balance Comments 4" and 6" toe touches., on airex reaches      Lumbar Exercises: Aerobic   UBE (Upper Arm Bike) Level 3 x 3 minutes    Nustep level 5 x 7 minutes      Lumbar Exercises: Standing   Heel Raises 20 reps;1 second    Other Standing Lumbar Exercises hip abduction, extension and marching 2.5# all 2x10      Lumbar Exercises: Seated    Long Arc Quad on Chair Left;3 sets;10 reps    LAQ on Chair Weights (lbs) 3    Other Seated Lumbar Exercises left arm triceps and row and extension with red tband    Other Seated Lumbar Exercises left DF 3x10 focus on the movement                       PT Short Term Goals - 04/29/21 1434       PT SHORT TERM GOAL #1   Title independent with initial HEP    Status Achieved               PT Long Term Goals - 05/27/21 1540       PT LONG TERM GOAL #2   Title decrease TUG time to 13 seconds      PT LONG TERM GOAL #3   Title walk around our building without rest     Status On-going      PT LONG TERM GOAL #4   Title decrease pain with walking by 50%    Status Partially Met                   Plan - 06/09/21 1148     Clinical Impression Statement Patient had a fall over the weekend, reports that he had on new slippers and felt like they stuck to the floor, he also reports that his hand slipped, he ended up in the floor, he could not get up and his wife could not get him up, had to call EMS to get him up.  He has some hip and back soreness, we worked today on the getting up from floor, he did well but I had him elevated about 12' off the floor and he needed mod A to get over to his knees, I will want to work with his wife on this in the future, he will have a bladder surgery in the next few weeks    PT Next Visit Plan would like to talk with his wife about getting up from floor if she is available    Consulted and Agree with Plan of Care Patient             Patient will benefit from skilled therapeutic intervention in order to improve the following deficits  and impairments:  Abnormal gait, Decreased coordination, Decreased range of motion, Difficulty walking, Increased muscle spasms, Cardiopulmonary status limiting activity, Decreased endurance, Decreased activity tolerance, Pain, Impaired flexibility, Decreased balance, Decreased mobility, Decreased  strength, Postural dysfunction  Visit Diagnosis: Hemiplegia and hemiparesis following cerebral infarction affecting left non-dominant side ([place])  Other lack of coordination  Difficulty in walking, not elsewhere classified  Muscle weakness (generalized)  Repeated falls  Other abnormalities of gait and mobility  Acute bilateral low back pain without sciatica     Problem List Patient Active Problem List   Diagnosis Date Noted   Closed fracture of first lumbar vertebra with routine healing 02/03/2021   Closed fracture of multiple ribs 11/18/2020   Depression with anxiety 01/29/2020   Leg pain, bilateral 01/29/2020   Ingrown toenail 07/13/2019   Lumbar spondylosis 05/02/2018   Pain in left knee 03/09/2018   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   Trochanteric bursitis of left hip 01/16/2018   Preventative health care 09/26/2017   HTN (hypertension) 07/19/2015   Hyperlipidemia 07/19/2015   Great toe pain 02/11/2014   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 [place], [name], [address] Phone: [phone]   Fax:  [phone]  Name: William [name] MRN: [id number] Date of Birth: 11-25-1940

## 2021-06-11 ENCOUNTER — Ambulatory Visit: Payer: PPO | Admitting: Physical Therapy

## 2021-06-14 ENCOUNTER — Other Ambulatory Visit (HOSPITAL_COMMUNITY): Payer: Self-pay

## 2021-06-15 ENCOUNTER — Other Ambulatory Visit (HOSPITAL_BASED_OUTPATIENT_CLINIC_OR_DEPARTMENT_OTHER): Payer: Self-pay

## 2021-06-16 ENCOUNTER — Other Ambulatory Visit: Payer: Self-pay

## 2021-06-16 ENCOUNTER — Ambulatory Visit: Payer: PPO | Attending: Family Medicine | Admitting: Physical Therapy

## 2021-06-16 ENCOUNTER — Encounter: Payer: Self-pay | Admitting: Physical Therapy

## 2021-06-16 DIAGNOSIS — M6281 Muscle weakness (generalized): Secondary | ICD-10-CM

## 2021-06-16 DIAGNOSIS — R278 Other lack of coordination: Secondary | ICD-10-CM

## 2021-06-16 DIAGNOSIS — M545 Low back pain, unspecified: Secondary | ICD-10-CM | POA: Diagnosis not present

## 2021-06-16 DIAGNOSIS — R2689 Other abnormalities of gait and mobility: Secondary | ICD-10-CM | POA: Diagnosis not present

## 2021-06-16 DIAGNOSIS — R296 Repeated falls: Secondary | ICD-10-CM

## 2021-06-16 DIAGNOSIS — I69354 Hemiplegia and hemiparesis following cerebral infarction affecting left non-dominant side: Secondary | ICD-10-CM | POA: Diagnosis not present

## 2021-06-16 DIAGNOSIS — R262 Difficulty in walking, not elsewhere classified: Secondary | ICD-10-CM | POA: Diagnosis not present

## 2021-06-16 NOTE — Therapy (Signed)
[place] [address]. [place], [name], [address] Phone: [phone]   Fax:  [phone]  Physical Therapy Treatment  Patient Details  Name: William [name] MRN: [id number] Date of Birth: Jun 25, 1940 Referring Provider (PT): Etter Sjogren   Encounter Date: 06/16/2021   PT End of Session - 06/16/21 1123     Visit Number 23    Date for PT Re-Evaluation 06/27/21    PT Start Time 0928    PT Stop Time 1014    PT Time Calculation (min) 46 min    Activity Tolerance Patient tolerated treatment well    Behavior During Therapy [facility] for tasks assessed/performed             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   Mild, related to stroke history   Nocturia    Renal insufficiency 06/25/2013   S/P carotid endarterectomy    BILATERAL ICA--  PATENT PER DUPLEX  05-19-2012   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   Right calf   Urinary frequency    Vitamin D deficiency     Past Surgical History:  Procedure Laterality Date   APPENDECTOMY  AS CHILD   CARDIOVASCULAR STRESS TEST  03-27-2012  DR CRENSHAW   LOW RISK LEXISCAN STUDY-- PROBABLE NORMAL PERFUSION AND SOFT TISSUE ATTENUATION/  NO ISCHEMIA/ EF 51%   CAROTID ENDARTERECTOMY Bilateral LEFT  11-12-2008  DR GREG HAYES   RIGHT ICA  2006  ([facility])   CYSTOSCOPY WITH LITHOLAPAXY  N/A 02/26/2013   Procedure: CYSTOSCOPY WITH LITHOLAPAXY;  Surgeon: Franchot Gallo, MD;  Location: [facility];  Service: Urology;  Laterality: N/A;   EYE SURGERY  Jan. 2016   cataract surgery both eyes   INGUINAL HERNIA REPAIR Right 11-08-2006   IR KYPHO EA ADDL LEVEL THORACIC OR LUMBAR  02/12/2021   IR RADIOLOGIST EVAL & MGMT  02/18/2021   MASS EXCISION N/A 03/03/2016   Procedure: EXCISION OF BACK  MASS;  Surgeon: Stark Klein, MD;  Location: [place];  Service: General;  Laterality: N/A;   MOHS SURGERY Left 1/ 2016   Dr Nevada Crane-- Basal cell   PROSTATE SURGERY     TRANSURETHRAL RESECTION OF PROSTATE N/A 02/26/2013   Procedure: TRANSURETHRAL RESECTION OF THE PROSTATE WITH GYRUS INSTRUMENTS;  Surgeon: Franchot Gallo, MD;  Location[facility];  Service: Urology;  Laterality: N/A;    There were no vitals filed for this visit.   Subjective Assessment - 06/16/21 0934     Subjective No falls, reports that he walked the dog and his back is hurting fter    Currently in Pain? Yes    Pain Score 4     Pain Location Back    Pain Descriptors / Indicators Sore  Aggravating Factors  walking dog   ° °  °  ° °  ° ° ° ° ° ° ° ° ° ° ° ° ° ° ° ° ° ° ° ° [facility] Adult PT Treatment/Exercise - 06/16/21 0001   ° °  ° Ambulation/Gait  ° Gait Comments fast walking, stairs   °  ° High Level Balance  ° High Level Balance Activities Side stepping;Backward walking;Direction changes   ° High Level Balance Comments putting left and right foot in a box, figure 8's, picking up cones, walking ball toss, sit/stand/trhow/catch   °  ° Lumbar Exercises: Aerobic  ° Nustep level 5 x 7 minutes   °  ° Lumbar Exercises: Standing  ° Heel Raises 20 reps;1 second   ° Other Standing Lumbar Exercises hip abduction, extension and marching 2.5# all 2x10   ° °  °  ° °  ° ° ° ° ° ° ° ° ° ° ° ° PT Short Term Goals - 04/29/21 1434   ° °  ° PT SHORT TERM GOAL #1  ° Title independent with initial HEP   °  Status Achieved   ° °  °  ° °  ° ° ° ° PT Long Term Goals - 05/27/21 1540   ° °  ° PT LONG TERM GOAL #2  ° Title decrease TUG time to 13 seconds   °  ° PT LONG TERM GOAL #3  ° Title walk around our building without rest    ° Status On-going   °  ° PT LONG TERM GOAL #4  ° Title decrease pain with walking by 50%   ° Status Partially Met   ° °  °  ° °  ° ° ° ° ° ° ° ° Plan - 06/16/21 1123   ° ° Clinical Impression Statement Patient reports that he walked the dog, did well going downthe slope but coming up got fatigued and his back started to hurt.  He did very well with me today on the balance, he is unsure of himselft and requires some cues for the motions especially with walking needs to move and relax the left arma nd take a bigger step with the left leg   ° PT Next Visit Plan would like to talk with his wife about getting up from floor if she is available   ° Consulted and Agree with Plan of Care Patient   ° °  °  ° °  ° ° °Patient will benefit from skilled therapeutic intervention in order to improve the following deficits and impairments:  Abnormal gait, Decreased coordination, Decreased range of motion, Difficulty walking, Increased muscle spasms, Cardiopulmonary status limiting activity, Decreased endurance, Decreased activity tolerance, Pain, Impaired flexibility, Decreased balance, Decreased mobility, Decreased strength, Postural dysfunction ° °Visit Diagnosis: °Hemiplegia and hemiparesis following cerebral infarction affecting left non-dominant side (HCC) ° °Other lack of coordination ° °Difficulty in walking, not elsewhere classified ° °Muscle weakness (generalized) ° °Repeated falls ° °Other abnormalities of gait and mobility ° °Acute bilateral low back pain without sciatica ° ° ° ° °Problem List °Patient Active Problem List  ° Diagnosis Date Noted  ° Closed fracture of first lumbar vertebra with routine healing 02/03/2021  ° Closed fracture of multiple ribs 11/18/2020  ° Depression with anxiety 01/29/2020   ° Leg pain, bilateral 01/29/2020  ° Ingrown toenail 07/13/2019  ° Lumbar spondylosis 05/02/2018  ° Pain in left knee 03/09/2018  ° 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  °   Trochanteric bursitis of left hip 01/16/2018   Preventative health care 09/26/2017   HTN (hypertension) 07/19/2015   Hyperlipidemia 07/19/2015   Great toe pain 02/11/2014   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 [place], [name], [address] Phone: [phone]   Fax:  [phone]  Name: William [name] MRN: [id number] Date of Birth: 21-Aug-1940

## 2021-06-17 ENCOUNTER — Encounter (HOSPITAL_BASED_OUTPATIENT_CLINIC_OR_DEPARTMENT_OTHER): Payer: Self-pay | Admitting: Urology

## 2021-06-17 ENCOUNTER — Encounter: Payer: Self-pay | Admitting: Physical Therapy

## 2021-06-17 ENCOUNTER — Ambulatory Visit: Payer: PPO | Admitting: Physical Therapy

## 2021-06-17 ENCOUNTER — Other Ambulatory Visit (HOSPITAL_BASED_OUTPATIENT_CLINIC_OR_DEPARTMENT_OTHER): Payer: Self-pay

## 2021-06-17 ENCOUNTER — Other Ambulatory Visit: Payer: Self-pay

## 2021-06-17 DIAGNOSIS — R262 Difficulty in walking, not elsewhere classified: Secondary | ICD-10-CM

## 2021-06-17 DIAGNOSIS — R278 Other lack of coordination: Secondary | ICD-10-CM

## 2021-06-17 DIAGNOSIS — R310 Gross hematuria: Secondary | ICD-10-CM | POA: Diagnosis not present

## 2021-06-17 DIAGNOSIS — C672 Malignant neoplasm of lateral wall of bladder: Secondary | ICD-10-CM | POA: Diagnosis not present

## 2021-06-17 DIAGNOSIS — R296 Repeated falls: Secondary | ICD-10-CM

## 2021-06-17 DIAGNOSIS — N401 Enlarged prostate with lower urinary tract symptoms: Secondary | ICD-10-CM | POA: Diagnosis not present

## 2021-06-17 DIAGNOSIS — I69354 Hemiplegia and hemiparesis following cerebral infarction affecting left non-dominant side: Secondary | ICD-10-CM

## 2021-06-17 DIAGNOSIS — M545 Low back pain, unspecified: Secondary | ICD-10-CM

## 2021-06-17 DIAGNOSIS — R3914 Feeling of incomplete bladder emptying: Secondary | ICD-10-CM | POA: Diagnosis not present

## 2021-06-17 DIAGNOSIS — M6281 Muscle weakness (generalized): Secondary | ICD-10-CM

## 2021-06-17 DIAGNOSIS — R2689 Other abnormalities of gait and mobility: Secondary | ICD-10-CM

## 2021-06-17 MED ORDER — AMLODIPINE BESYLATE 10 MG PO TABS
10.0000 mg | ORAL_TABLET | Freq: Every day | ORAL | 3 refills | Status: DC
  Filled 2021-06-17: qty 90, 90d supply, fill #0

## 2021-06-17 NOTE — Progress Notes (Signed)
Spoke w/ via phone for pre-op interview---wife, [name]----  [employer] and EKG             Lab results------NA COVID test -----wife states asymptomatic no test needed. Arrive at 05:30 06/22/21. NPO after MN NO Solid Food.  Clear liquids from MN until 04:30. Med rec completed. Medications to take morning of surgery: Flomax, Amlodipine, and Carbamazepine. Hold MVI, Aspirin, and flaxseed until after procedure. Diabetic medication -----NA Patient instructed to bring photo id and insurance card day of surgery. Patient aware to have Driver (ride ) / caregiver    for 24 hours after surgery. Patient Special Instructions -----NA Pre-Op special Istructions -----NA Patient verbalized understanding of instructions that were given at this phone interview. Patient denies shortness of breath, chest pain, fever, cough at this phone interview.

## 2021-06-17 NOTE — Therapy (Signed)
[place]. [place], [name], [address] Phone: [phone]   Fax:  [phone]  Physical Therapy Treatment  Patient Details  Name: William [name] MRN: [id number] Date of Birth: Feb 14, 1941 Referring Provider (PT): Etter Sjogren   Encounter Date: 06/17/2021   PT End of Session - 06/17/21 1403     Visit Number 24    Date for PT Re-Evaluation 06/27/21    PT Start Time 1324    PT Stop Time 1400    PT Time Calculation (min) 43 min    Activity Tolerance Patient tolerated treatment well    Behavior During Therapy [facility] for tasks assessed/performed             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   Mild, related to stroke history   Nocturia    Renal insufficiency 06/25/2013   S/P carotid endarterectomy    BILATERAL ICA--  PATENT PER DUPLEX  05-19-2012   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   Right calf   Urinary frequency    Vitamin D deficiency     Past Surgical History:  Procedure Laterality Date   APPENDECTOMY  AS CHILD   CARDIOVASCULAR STRESS TEST  03-27-2012  DR CRENSHAW   LOW RISK LEXISCAN STUDY-- PROBABLE NORMAL PERFUSION AND SOFT TISSUE ATTENUATION/  NO ISCHEMIA/ EF 51%   CAROTID ENDARTERECTOMY Bilateral LEFT  11-12-2008  DR GREG HAYES   RIGHT ICA  2006  ([facility])   CYSTOSCOPY WITH  LITHOLAPAXY N/A 02/26/2013   Procedure: CYSTOSCOPY WITH LITHOLAPAXY;  Surgeon: Franchot Gallo, MD;  Location: [facility];  Service: Urology;  Laterality: N/A;   EYE SURGERY  Jan. 2016   cataract surgery both eyes   INGUINAL HERNIA REPAIR Right 11-08-2006   IR KYPHO EA ADDL LEVEL THORACIC OR LUMBAR  02/12/2021   IR RADIOLOGIST EVAL & MGMT  02/18/2021   MASS EXCISION N/A 03/03/2016   Procedure: EXCISION OF BACK  MASS;  Surgeon: Stark Klein, MD;  Location: [place];  Service: General;  Laterality: N/A;   MOHS SURGERY Left 1/ 2016   Dr Nevada Crane-- Basal cell   PROSTATE SURGERY     TRANSURETHRAL RESECTION OF PROSTATE N/A 02/26/2013   Procedure: TRANSURETHRAL RESECTION OF THE PROSTATE WITH GYRUS INSTRUMENTS;  Surgeon: Franchot Gallo, MD;  Location: [facility];  Service: Urology;  Laterality: N/A;    There were no vitals filed for this visit.   Subjective Assessment - 06/17/21 1335     Subjective Tired, had meeting with MD this morning, "a little stressed"    Currently in Pain? Yes    Pain Score 4     Pain Location Back    Pain Orientation Lower    Pain Descriptors / Indicators Sore  Aggravating Factors  stress                               [facility] Adult PT Treatment/Exercise - 06/17/21 0001       Ambulation/Gait   Gait Comments fast walking cues for big step, heel strike and left  arm swing      High Level Balance   High Level Balance Activities Side stepping;Backward walking;Direction changes    High Level Balance Comments putting left and right foot in a box, figure 8's, picking up cones, walking ball toss, sit/stand/trhow/catch      Lumbar Exercises: Aerobic   Nustep level 5 x 7 minutes      Lumbar Exercises: Machines for Strengthening   Cybex Knee Flexion 20# 3x10    Other Lumbar Machine Exercise lats and seated row focus on the left 20# 2x10    Other Lumbar Machine Exercise 5# chest press 2x10 left only                        PT Short Term Goals - 04/29/21 1434       PT SHORT TERM GOAL #1   Title independent with initial HEP    Status Achieved               PT Long Term Goals - 06/17/21 1408       PT LONG TERM GOAL #2   Title decrease TUG time to 13 seconds    Status Partially Met      PT LONG TERM GOAL #3   Title walk around our building without rest     Status On-going                   Plan - 06/17/21 1404     Clinical Impression Statement Patient met with surgeon today, he reports that his mind is elsewhere and he is worried about the surgery next week.  He did much better walking today, better adjustments with cues for big steps and arm swing.  He is still very neglectful of the left side   He is unsure of himself and tends to be very dareful    PT Next Visit Plan he will have surgery next week, we will not see him for 2weeks    Consulted and Agree with Plan of Care Patient             Patient will benefit from skilled therapeutic intervention in order to improve the following deficits and impairments:  Abnormal gait, Decreased coordination, Decreased range of motion, Difficulty walking, Increased muscle spasms, Cardiopulmonary status limiting activity, Decreased endurance, Decreased activity tolerance, Pain, Impaired flexibility, Decreased balance, Decreased mobility, Decreased strength, Postural dysfunction  Visit Diagnosis: Hemiplegia and hemiparesis following cerebral infarction affecting left non-dominant side (HCC)  Other lack of coordination  Difficulty in walking, not elsewhere classified  Muscle weakness (generalized)  Repeated falls  Other abnormalities of gait and mobility  Acute bilateral low back pain without sciatica     Problem List Patient Active Problem List   Diagnosis Date Noted   Closed fracture of first lumbar vertebra with routine healing 02/03/2021   Closed fracture of multiple ribs 11/18/2020   Depression  with anxiety 01/29/2020   Leg pain, bilateral 01/29/2020   Ingrown toenail 07/13/2019   Lumbar spondylosis 05/02/2018   Pain in left knee 03/09/2018   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  Trochanteric bursitis of left hip 01/16/2018   Preventative health care 09/26/2017   HTN (hypertension) 07/19/2015   Hyperlipidemia 07/19/2015   Great toe pain 02/11/2014   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 [place], [name], [address] Phone: [phone]   Fax:  [phone]  Name: William [name] MRN: [id number] Date of Birth: [birth year]

## 2021-06-21 NOTE — H&P (Signed)
H&P  Chief Complaint: Bladder tumor, large prostate  History of Present Illness: 81 yo male presents for cysto, (B) RGPs, TURP, TURBT. He is s/p TURP w/ cystolithalopaxy in 2014. He has had recurrent sx's of BPH as well as intermittent gross hematuria. Uper tracts nml on CT, bladder wall thickening as well. Cysto--regrowth of prostate, obstruction, ~2cm Rt lateral wall bladder tumors.  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   Mild, related to stroke history   Nocturia    Renal insufficiency 06/25/2013   S/P carotid endarterectomy    BILATERAL ICA--  PATENT PER DUPLEX  05-19-2012   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   Right calf   Urinary frequency    Vitamin D deficiency     Past Surgical History:  Procedure Laterality Date   APPENDECTOMY  AS CHILD   CARDIOVASCULAR STRESS TEST  03-27-2012  DR CRENSHAW   LOW RISK LEXISCAN STUDY-- PROBABLE NORMAL PERFUSION AND SOFT TISSUE ATTENUATION/  NO ISCHEMIA/ EF 51%   CAROTID ENDARTERECTOMY Bilateral LEFT  11-12-2008  DR GREG HAYES   RIGHT ICA  2006  ([facility])   CYSTOSCOPY WITH LITHOLAPAXY N/A 02/26/2013   Procedure: CYSTOSCOPY WITH LITHOLAPAXY;  Surgeon: Franchot Gallo, MD;  Location: [facility];  Service: Urology;  Laterality: N/A;   EYE SURGERY  Jan. 2016   cataract surgery both eyes   INGUINAL HERNIA  REPAIR Right 11-08-2006   IR KYPHO EA ADDL LEVEL THORACIC OR LUMBAR  02/12/2021   IR RADIOLOGIST EVAL & MGMT  02/18/2021   MASS EXCISION N/A 03/03/2016   Procedure: EXCISION OF BACK  MASS;  Surgeon: Stark Klein, MD;  Location: [facility];  Service: General;  Laterality: N/A;   MOHS SURGERY Left 1/ 2016   Dr Nevada Crane-- Basal cell   PROSTATE SURGERY     TRANSURETHRAL RESECTION OF PROSTATE N/A 02/26/2013   Procedure: TRANSURETHRAL RESECTION OF THE PROSTATE WITH GYRUS INSTRUMENTS;  Surgeon: Franchot Gallo, MD;  Location: [facility];  Service: Urology;  Laterality: N/A;    Home Medications:  Allergies as of 06/21/2021       Reactions   Strawberry Extract Hives   Latex Itching   Adhesive [tape] Other (See Comments)   BLISTER   Statins Other (See Comments)   myalgias        Medication List      Notice   Cannot display discharge medications because the patient has not yet been admitted.     Allergies:  Allergies  Allergen Reactions   Strawberry Extract Hives   Latex Itching   Adhesive [Tape] Other (See Comments)    BLISTER   Statins Other (See Comments)    myalgias    Family History  Problem Relation Age of Onset   Heart disease  Mother        CHF   Bipolar disorder Mother    Heart disease Father        CHF    Social History:  reports that he quit smoking about 16 years ago. His smoking use included cigarettes. He has a 80.00 pack-year smoking history. He has never used smokeless tobacco. He reports current alcohol use. He reports that he does not use drugs.  ROS: A complete review of systems was performed.  All systems are negative except for pertinent findings as noted.  Physical Exam:  Vital signs in last 24 hours: Ht 5' 10.5" (1.791 m)    Wt 96.6 kg    BMI 30.13 kg/m  Constitutional:  Alert and oriented, No acute distress Cardiovascular: Regular rate  Respiratory: Normal respiratory effort GI: Abdomen is soft, nontender,  nondistended, no abdominal masses. No CVAT.  Genitourinary: Normal male phallus, testes are descended bilaterally and non-tender and without masses, scrotum is normal in appearance without lesions or masses, perineum is normal on inspection. Lymphatic: No lymphadenopathy Neurologic: Grossly intact, no focal deficits Psychiatric: Normal mood and affect  I have reviewed prior pt notes  I have reviewed urinalysis results  I have independently reviewed prior imaging     Impression/Assessment:  BPH w/ regrowth following TURP, bladder tumor  Plan:  Cysto, (B) RGPs, TURP, TURBT, possible gemcitabine

## 2021-06-21 NOTE — Anesthesia Preprocedure Evaluation (Addendum)
Anesthesia Evaluation  Patient identified by MRN, date of birth, ID band Patient awake    Reviewed: Allergy & Precautions, H&P , NPO status , Patient's Chart, lab work & pertinent test results  Airway Mallampati: II  TM Distance: >3 FB Neck ROM: Full    Dental  (+) Dental Advisory Given, Upper Dentures, Poor Dentition, Missing, Chipped   Pulmonary former smoker,    Pulmonary exam normal breath sounds clear to auscultation       Cardiovascular hypertension, Pt. on medications + Peripheral Vascular Disease  Normal cardiovascular exam Rhythm:Regular Rate:Normal  Myocardial perfusion scan 03/2012 WNL   Neuro/Psych  Headaches, PSYCHIATRIC DISORDERS Anxiety Depression CVA, Residual Symptoms negative neurological ROS     GI/Hepatic Neg liver ROS, GERD  Medicated,  Endo/Other  negative endocrine ROS  Renal/GU Renal disease     Musculoskeletal  (+) Arthritis ,   Abdominal (+) + obese,   Peds  Hematology negative hematology ROS (+)   Anesthesia Other Findings   Reproductive/Obstetrics                            Anesthesia Physical Anesthesia Plan  ASA: 3  Anesthesia Plan: General   Post-op Pain Management: Minimal or no pain anticipated   Induction: Intravenous  PONV Risk Score and Plan: 3 and Ondansetron, Dexamethasone and Treatment may vary due to age or medical condition  Airway Management Planned: LMA  Additional Equipment: None  Intra-op Plan:   Post-operative Plan: Extubation in OR  Informed Consent: I have reviewed the patients History and Physical, chart, labs and discussed the procedure including the risks, benefits and alternatives for the proposed anesthesia with the patient or authorized representative who has indicated his/her understanding and acceptance.     Dental advisory given  Plan Discussed with: CRNA  Anesthesia Plan Comments:        Anesthesia Quick  Evaluation

## 2021-06-22 ENCOUNTER — Encounter (HOSPITAL_BASED_OUTPATIENT_CLINIC_OR_DEPARTMENT_OTHER)
Admission: RE | Disposition: A | Discharge status: Home / Self Care | Payer: Self-pay | Source: Home / Self Care | Attending: Urology

## 2021-06-22 ENCOUNTER — Encounter (HOSPITAL_BASED_OUTPATIENT_CLINIC_OR_DEPARTMENT_OTHER): Payer: Self-pay | Admitting: Urology

## 2021-06-22 ENCOUNTER — Ambulatory Visit (HOSPITAL_BASED_OUTPATIENT_CLINIC_OR_DEPARTMENT_OTHER): Payer: PPO | Admitting: Anesthesiology

## 2021-06-22 ENCOUNTER — Observation Stay (HOSPITAL_BASED_OUTPATIENT_CLINIC_OR_DEPARTMENT_OTHER)
Admission: RE | Admit: 2021-06-22 | Discharge: 2021-06-23 | Disposition: A | Discharge status: Home / Self Care | Payer: PPO | Attending: Urology | Admitting: Urology

## 2021-06-22 ENCOUNTER — Other Ambulatory Visit: Payer: Self-pay

## 2021-06-22 DIAGNOSIS — Z91040 Latex allergy status: Secondary | ICD-10-CM | POA: Insufficient documentation

## 2021-06-22 DIAGNOSIS — C675 Malignant neoplasm of bladder neck: Secondary | ICD-10-CM | POA: Insufficient documentation

## 2021-06-22 DIAGNOSIS — C670 Malignant neoplasm of trigone of bladder: Principal | ICD-10-CM | POA: Insufficient documentation

## 2021-06-22 DIAGNOSIS — Z79899 Other long term (current) drug therapy: Secondary | ICD-10-CM | POA: Insufficient documentation

## 2021-06-22 DIAGNOSIS — N183 Chronic kidney disease, stage 3 unspecified: Secondary | ICD-10-CM | POA: Diagnosis not present

## 2021-06-22 DIAGNOSIS — N138 Other obstructive and reflux uropathy: Secondary | ICD-10-CM | POA: Diagnosis not present

## 2021-06-22 DIAGNOSIS — N401 Enlarged prostate with lower urinary tract symptoms: Secondary | ICD-10-CM | POA: Diagnosis not present

## 2021-06-22 DIAGNOSIS — I129 Hypertensive chronic kidney disease with stage 1 through stage 4 chronic kidney disease, or unspecified chronic kidney disease: Secondary | ICD-10-CM | POA: Diagnosis not present

## 2021-06-22 DIAGNOSIS — Z85828 Personal history of other malignant neoplasm of skin: Secondary | ICD-10-CM | POA: Diagnosis not present

## 2021-06-22 DIAGNOSIS — Z87891 Personal history of nicotine dependence: Secondary | ICD-10-CM | POA: Insufficient documentation

## 2021-06-22 DIAGNOSIS — C672 Malignant neoplasm of lateral wall of bladder: Secondary | ICD-10-CM | POA: Diagnosis not present

## 2021-06-22 DIAGNOSIS — C678 Malignant neoplasm of overlapping sites of bladder: Secondary | ICD-10-CM | POA: Diagnosis present

## 2021-06-22 DIAGNOSIS — C679 Malignant neoplasm of bladder, unspecified: Secondary | ICD-10-CM | POA: Diagnosis not present

## 2021-06-22 DIAGNOSIS — N400 Enlarged prostate without lower urinary tract symptoms: Secondary | ICD-10-CM | POA: Diagnosis not present

## 2021-06-22 HISTORY — PX: 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-C: SHX6459

## 2021-06-22 HISTORY — PX: CYSTOSCOPY W/ RETROGRADES: SHX1426

## 2021-06-22 HISTORY — PX: TRANSURETHRAL RESECTION OF PROSTATE: SHX73

## 2021-06-22 LAB — POCT I-STAT, CHEM 8
BUN: 35 mg/dL — ABNORMAL HIGH (ref 8–23)
Calcium, Ion: 1.21 mmol/L (ref 1.15–1.40)
Chloride: 109 mmol/L (ref 98–111)
Creatinine, Ser: 2.3 mg/dL — ABNORMAL HIGH (ref 0.61–1.24)
Glucose, Bld: 120 mg/dL — ABNORMAL HIGH (ref 70–99)
HCT: 35 % — ABNORMAL LOW (ref 39.0–52.0)
Hemoglobin: 11.9 g/dL — ABNORMAL LOW (ref 13.0–17.0)
Potassium: 4 mmol/L (ref 3.5–5.1)
Sodium: 143 mmol/L (ref 135–145)
TCO2: 24 mmol/L (ref 22–32)

## 2021-06-22 SURGERY — 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-C
Anesthesia: General

## 2021-06-22 MED ORDER — ZOLPIDEM TARTRATE 5 MG PO TABS: 5.0000 mg | ORAL_TABLET | Freq: Every evening | ORAL | Status: DC | PRN

## 2021-06-22 MED ORDER — MIDAZOLAM HCL 2 MG/2ML IJ SOLN
INTRAMUSCULAR | Status: AC
  Filled 2021-06-22: qty 2

## 2021-06-22 MED ORDER — HYDROCHLOROTHIAZIDE 12.5 MG PO TABS
12.5000 mg | ORAL_TABLET | Freq: Every day | ORAL | Status: DC
  Administered 2021-06-22: 12.5 mg via ORAL
  Filled 2021-06-22: qty 1

## 2021-06-22 MED ORDER — LISINOPRIL 10 MG PO TABS
10.0000 mg | ORAL_TABLET | Freq: Every day | ORAL | Status: DC
  Administered 2021-06-22: 10 mg via ORAL
  Filled 2021-06-22: qty 1

## 2021-06-22 MED ORDER — PHENYLEPHRINE 40 MCG/ML (10ML) SYRINGE FOR IV PUSH (FOR BLOOD PRESSURE SUPPORT)
PREFILLED_SYRINGE | INTRAVENOUS | Status: AC
  Filled 2021-06-22: qty 10

## 2021-06-22 MED ORDER — TIZANIDINE HCL 4 MG PO TABS
4.0000 mg | ORAL_TABLET | Freq: Four times a day (QID) | ORAL | Status: DC | PRN
  Filled 2021-06-22: qty 1

## 2021-06-22 MED ORDER — EPHEDRINE SULFATE-NACL 50-0.9 MG/10ML-% IV SOSY
PREFILLED_SYRINGE | INTRAVENOUS | Status: DC | PRN
Start: 2021-06-22 — End: 2021-06-22
  Administered 2021-06-22 (×2): 15 mg via INTRAVENOUS

## 2021-06-22 MED ORDER — PROPOFOL 10 MG/ML IV BOLUS
INTRAVENOUS | Status: AC
  Filled 2021-06-22: qty 20

## 2021-06-22 MED ORDER — PANTOPRAZOLE SODIUM 40 MG PO TBEC
DELAYED_RELEASE_TABLET | ORAL | Status: AC
  Filled 2021-06-22: qty 1

## 2021-06-22 MED ORDER — AMLODIPINE BESYLATE 10 MG PO TABS
10.0000 mg | ORAL_TABLET | Freq: Every day | ORAL | Status: DC
Start: 2021-06-22 — End: 2021-06-23
  Filled 2021-06-22: qty 1

## 2021-06-22 MED ORDER — LIDOCAINE 2% (20 MG/ML) 5 ML SYRINGE
INTRAMUSCULAR | Status: DC | PRN
  Administered 2021-06-22: 80 mg via INTRAVENOUS

## 2021-06-22 MED ORDER — CARBAMAZEPINE ER 100 MG PO TB12: 100.0000 mg | ORAL_TABLET | Freq: Two times a day (BID) | ORAL | Status: DC

## 2021-06-22 MED ORDER — ACETAMINOPHEN 325 MG PO TABS: 650.0000 mg | ORAL_TABLET | ORAL | Status: DC | PRN

## 2021-06-22 MED ORDER — SODIUM CHLORIDE 0.9 % IR SOLN: 3000.0000 mL | Status: DC

## 2021-06-22 MED ORDER — ONDANSETRON HCL 4 MG/2ML IJ SOLN
INTRAMUSCULAR | Status: DC | PRN
Start: 2021-06-22 — End: 2021-06-22
  Administered 2021-06-22: 4 mg via INTRAVENOUS

## 2021-06-22 MED ORDER — IOHEXOL 300 MG/ML  SOLN
INTRAMUSCULAR | Status: DC | PRN
  Administered 2021-06-22: 7 mL via URETHRAL

## 2021-06-22 MED ORDER — LISINOPRIL-HYDROCHLOROTHIAZIDE 10-12.5 MG PO TABS: 1.0000 | ORAL_TABLET | Freq: Every day | ORAL | Status: DC

## 2021-06-22 MED ORDER — CEFAZOLIN SODIUM-DEXTROSE 2-4 GM/100ML-% IV SOLN
2.0000 g | INTRAVENOUS | Status: AC
  Administered 2021-06-22: 2 g via INTRAVENOUS

## 2021-06-22 MED ORDER — FENTANYL CITRATE (PF) 100 MCG/2ML IJ SOLN
INTRAMUSCULAR | Status: AC
  Filled 2021-06-22: qty 2

## 2021-06-22 MED ORDER — SUGAMMADEX SODIUM 200 MG/2ML IV SOLN
INTRAVENOUS | Status: DC | PRN
  Administered 2021-06-22: 200 mg via INTRAVENOUS

## 2021-06-22 MED ORDER — GEMCITABINE CHEMO FOR BLADDER INSTILLATION 2000 MG
2000.0000 mg | Freq: Once | INTRAVENOUS | Status: DC
  Administered 2021-06-22: 2000 mg via INTRAVESICAL
  Filled 2021-06-22: qty 2000

## 2021-06-22 MED ORDER — FENTANYL CITRATE (PF) 100 MCG/2ML IJ SOLN
INTRAMUSCULAR | Status: DC | PRN
  Administered 2021-06-22 (×2): 50 ug via INTRAVENOUS

## 2021-06-22 MED ORDER — STERILE WATER FOR IRRIGATION IR SOLN
Status: DC | PRN
  Administered 2021-06-22: 50 mL

## 2021-06-22 MED ORDER — SENNA 8.6 MG PO TABS
1.0000 | ORAL_TABLET | Freq: Two times a day (BID) | ORAL | Status: DC
  Administered 2021-06-22 (×2): 8.6 mg via ORAL
  Filled 2021-06-22: qty 1

## 2021-06-22 MED ORDER — SODIUM CHLORIDE 0.45 % IV SOLN: INTRAVENOUS | Status: DC

## 2021-06-22 MED ORDER — BELLADONNA ALKALOIDS-OPIUM 16.2-60 MG RE SUPP
RECTAL | Status: AC
  Filled 2021-06-22: qty 1

## 2021-06-22 MED ORDER — CEPHALEXIN 500 MG PO CAPS
500.0000 mg | ORAL_CAPSULE | Freq: Two times a day (BID) | ORAL | Status: DC
  Administered 2021-06-22 – 2021-06-23 (×2): 500 mg via ORAL
  Filled 2021-06-22 (×2): qty 1

## 2021-06-22 MED ORDER — ROCURONIUM BROMIDE 10 MG/ML (PF) SYRINGE
PREFILLED_SYRINGE | INTRAVENOUS | Status: AC
  Filled 2021-06-22: qty 10

## 2021-06-22 MED ORDER — PROPOFOL 10 MG/ML IV BOLUS
INTRAVENOUS | Status: DC | PRN
  Administered 2021-06-22: 110 mg via INTRAVENOUS

## 2021-06-22 MED ORDER — LORAZEPAM 0.5 MG PO TABS
0.5000 mg | ORAL_TABLET | Freq: Every evening | ORAL | Status: DC
  Administered 2021-06-22: 0.5 mg via ORAL
  Filled 2021-06-22: qty 1

## 2021-06-22 MED ORDER — HYDROCODONE-ACETAMINOPHEN 5-325 MG PO TABS
ORAL_TABLET | ORAL | Status: AC
  Filled 2021-06-22: qty 1

## 2021-06-22 MED ORDER — LIDOCAINE 2% (20 MG/ML) 5 ML SYRINGE
INTRAMUSCULAR | Status: AC
  Filled 2021-06-22: qty 5

## 2021-06-22 MED ORDER — LACTATED RINGERS IV SOLN: INTRAVENOUS | Status: DC

## 2021-06-22 MED ORDER — CEFAZOLIN SODIUM-DEXTROSE 2-4 GM/100ML-% IV SOLN
INTRAVENOUS | Status: AC
  Filled 2021-06-22: qty 100

## 2021-06-22 MED ORDER — FENOFIBRATE 160 MG PO TABS
160.0000 mg | ORAL_TABLET | Freq: Every day | ORAL | Status: DC
Start: 2021-06-22 — End: 2021-06-23
  Administered 2021-06-22: 160 mg via ORAL
  Filled 2021-06-22: qty 1

## 2021-06-22 MED ORDER — PANTOPRAZOLE SODIUM 40 MG PO TBEC
40.0000 mg | DELAYED_RELEASE_TABLET | Freq: Two times a day (BID) | ORAL | Status: DC
  Administered 2021-06-22 – 2021-06-23 (×2): 40 mg via ORAL

## 2021-06-22 MED ORDER — DROPERIDOL 2.5 MG/ML IJ SOLN: 0.6250 mg | Freq: Once | INTRAMUSCULAR | Status: DC | PRN

## 2021-06-22 MED ORDER — SENNA 8.6 MG PO TABS
ORAL_TABLET | ORAL | Status: AC
  Filled 2021-06-22: qty 1

## 2021-06-22 MED ORDER — FENTANYL CITRATE (PF) 100 MCG/2ML IJ SOLN: 25.0000 ug | INTRAMUSCULAR | Status: DC | PRN

## 2021-06-22 MED ORDER — SODIUM CHLORIDE 0.9 % IV SOLN: INTRAVENOUS | Status: DC

## 2021-06-22 MED ORDER — BELLADONNA ALKALOIDS-OPIUM 16.2-60 MG RE SUPP: 1.0000 | Freq: Four times a day (QID) | RECTAL | Status: DC | PRN

## 2021-06-22 MED ORDER — EPHEDRINE 5 MG/ML INJ
INTRAVENOUS | Status: AC
  Filled 2021-06-22: qty 5

## 2021-06-22 MED ORDER — CARBAMAZEPINE ER 100 MG PO TB12
100.0000 mg | ORAL_TABLET | Freq: Two times a day (BID) | ORAL | Status: DC
  Administered 2021-06-22: 100 mg via ORAL
  Filled 2021-06-22: qty 1

## 2021-06-22 MED ORDER — PHENYLEPHRINE HCL (PRESSORS) 10 MG/ML IV SOLN
INTRAVENOUS | Status: DC | PRN
Start: 2021-06-22 — End: 2021-06-22
  Administered 2021-06-22 (×2): 80 ug via INTRAVENOUS
  Administered 2021-06-22: 40 ug via INTRAVENOUS
  Administered 2021-06-22: 80 ug via INTRAVENOUS
  Administered 2021-06-22: 40 ug via INTRAVENOUS

## 2021-06-22 MED ORDER — BELLADONNA ALKALOIDS-OPIUM 16.2-60 MG RE SUPP
RECTAL | Status: DC | PRN
  Administered 2021-06-22: 1 via RECTAL

## 2021-06-22 MED ORDER — ROCURONIUM BROMIDE 100 MG/10ML IV SOLN
INTRAVENOUS | Status: DC | PRN
  Administered 2021-06-22: 10 mg via INTRAVENOUS
  Administered 2021-06-22: 50 mg via INTRAVENOUS

## 2021-06-22 MED ORDER — ONDANSETRON HCL 4 MG/2ML IJ SOLN
INTRAMUSCULAR | Status: AC
  Filled 2021-06-22: qty 2

## 2021-06-22 MED ORDER — HYDROCODONE-ACETAMINOPHEN 5-325 MG PO TABS
1.0000 | ORAL_TABLET | ORAL | Status: DC | PRN
  Administered 2021-06-22 – 2021-06-23 (×3): 1 via ORAL

## 2021-06-22 SURGICAL SUPPLY — 44 items
BAG DRAIN URO-CYSTO SKYTR STRL (DRAIN) ×3 IMPLANT
BAG DRN RND TRDRP ANRFLXCHMBR (UROLOGICAL SUPPLIES) ×2
BAG DRN UROCATH (DRAIN) ×2
BAG URINE DRAIN 2000ML AR STRL (UROLOGICAL SUPPLIES) ×3 IMPLANT
BAG URINE LEG 500ML (DRAIN) IMPLANT
BASKET ZERO TIP NITINOL 2.4FR (BASKET) IMPLANT
BSKT STON RTRVL ZERO TP 2.4FR (BASKET)
CATH FOLEY 2WAY SLVR  5CC 20FR (CATHETERS)
CATH FOLEY 2WAY SLVR  5CC 22FR (CATHETERS)
CATH FOLEY 2WAY SLVR 30CC 22FR (CATHETERS) IMPLANT
CATH FOLEY 2WAY SLVR 5CC 20FR (CATHETERS) IMPLANT
CATH FOLEY 2WAY SLVR 5CC 22FR (CATHETERS) IMPLANT
CATH FOLEY 3WAY 30CC 22FR (CATHETERS) IMPLANT
CATH HEMA 3WAY 30CC 24FR COUDE (CATHETERS) IMPLANT
CATH HEMA 3WAY 30CC 24FR RND (CATHETERS) IMPLANT
CATH INTERMIT  6FR 70CM (CATHETERS) ×3 IMPLANT
CATH SILICONE 22FR 30CC 3WAY (CATHETERS) ×1 IMPLANT
CLOTH BEACON ORANGE TIMEOUT ST (SAFETY) ×3 IMPLANT
COVER DOME SNAP 22 D (MISCELLANEOUS) ×2 IMPLANT
ELECT REM PT RETURN 9FT ADLT (ELECTROSURGICAL) ×3
ELECTRODE REM PT RTRN 9FT ADLT (ELECTROSURGICAL) ×2 IMPLANT
EVACUATOR MICROVAS BLADDER (UROLOGICAL SUPPLIES) IMPLANT
FIBER LASER FLEXIVA 365 (UROLOGICAL SUPPLIES) IMPLANT
GLOVE SURG ENC MOIS LTX SZ8 (GLOVE) ×3 IMPLANT
GOWN STRL REUS W/TWL XL LVL3 (GOWN DISPOSABLE) ×3 IMPLANT
GUIDEWIRE ANG ZIPWIRE 038X150 (WIRE) IMPLANT
GUIDEWIRE STR DUAL SENSOR (WIRE) IMPLANT
HOLDER FOLEY CATH W/STRAP (MISCELLANEOUS) IMPLANT
IV NS IRRIG 3000ML ARTHROMATIC (IV SOLUTION) ×14 IMPLANT
KIT TURNOVER CYSTO (KITS) ×3 IMPLANT
LOOP CUT BIPOLAR 24F LRG (ELECTROSURGICAL) ×3 IMPLANT
MANIFOLD NEPTUNE II (INSTRUMENTS) ×3 IMPLANT
NS IRRIG 500ML POUR BTL (IV SOLUTION) ×3 IMPLANT
PACK CYSTO (CUSTOM PROCEDURE TRAY) ×3 IMPLANT
PLUG CATH AND CAP STER (CATHETERS) IMPLANT
SHEATH URETERAL 12FRX35CM (MISCELLANEOUS) IMPLANT
SYR 30ML LL (SYRINGE) IMPLANT
SYR TOOMEY IRRIG 70ML (MISCELLANEOUS) ×3
SYRINGE TOOMEY IRRIG 70ML (MISCELLANEOUS) ×2 IMPLANT
TRACTIP FLEXIVA PULS ID 200XHI (Laser) IMPLANT
TRACTIP FLEXIVA PULSE ID 200 (Laser)
TUBE CONNECTING 12X1/4 (SUCTIONS) ×3 IMPLANT
TUBING UROLOGY SET (TUBING) ×1 IMPLANT
WATER STERILE IRR 500ML POUR (IV SOLUTION) IMPLANT

## 2021-06-22 NOTE — Anesthesia Procedure Notes (Signed)
Procedure Name: Intubation Date/Time: 06/22/2021 7:45 AM Performed by: Suan Halter, CRNA Pre-anesthesia Checklist: Patient identified, Emergency Drugs available, Suction available and Patient being monitored Patient Re-evaluated:Patient Re-evaluated prior to induction Oxygen Delivery Method: Circle system utilized Preoxygenation: Pre-oxygenation with 100% oxygen Induction Type: IV induction Ventilation: Mask ventilation without difficulty Laryngoscope Size: Mac and 4 Grade View: Grade I Tube type: Oral Tube size: 7.5 mm Number of attempts: 1 Airway Equipment and Method: Stylet and Oral airway Placement Confirmation: ETT inserted through vocal cords under direct vision, positive ETCO2 and breath sounds checked- equal and bilateral Secured at: 22 cm Tube secured with: Tape Dental Injury: Teeth and Oropharynx as per pre-operative assessment

## 2021-06-22 NOTE — Discharge Instructions (Signed)
Transurethral Resection of the Prostate ° °Care After ° °Refer to this sheet in the next few weeks. These discharge instructions provide you with general information on caring for yourself after you leave the hospital. Your caregiver may also give you specific instructions. Your treatment has been planned according to the most current medical practices available, but unavoidable complications sometimes occur. If you have any problems or questions after discharge, please call your caregiver. ° °HOME CARE INSTRUCTIONS  ° °Medications °· You may receive medicine for pain management. As your level of discomfort decreases, adjustments in your pain medicines may be made.  °· Take all medicines as directed.  °· You may be given a medicine (antibiotic) to kill germs following surgery. Finish all medicines. Let your caregiver know if you have any side effects or problems from the medicine.  °· If you are on aspirin, it would be best not to restart the aspirin until the blood in the urine clears °Hygiene °· You can take a shower after surgery.  °· You should not take a bath while you still have the urethral catheter. °Activity °· You will be encouraged to get out of bed as much as possible and increase your activity level as tolerated.  °· Spend the first week in and around your home. For 3 weeks, avoid the following:  °· Straining.  °· Running.  °· Strenuous work.  °· Walks longer than a few blocks.  °· Riding for extended periods.  °· Sexual relations.  °· Do not lift heavy objects (more than 20 pounds) for at least 1 month. When lifting, use your arms instead of your abdominal muscles.  °· You will be encouraged to walk as tolerated. Do not exert yourself. Increase your activity level slowly. Remember that it is important to keep moving after an operation of any type. This cuts down on the possibility of developing blood clots.  °· Your caregiver will tell you when you can resume driving and light housework. Discuss this  at your first office visit after discharge. °Diet °· No special diet is ordered after a TURP. However, if you are on a special diet for another medical problem, it should be continued.  °· Normal fluid intake is usually recommended.  °· Avoid alcohol and caffeinated drinks for 2 weeks. They irritate the bladder. Decaffeinated drinks are okay.  °· Avoid spicy foods.  °Bladder Function °· For the first 10 days, empty the bladder whenever you feel a definite desire. Do not try to hold the urine for long periods of time.  °· Urinating once or twice a night even after you are healed is not uncommon.  °· You may see some recurrence of blood in the urine after discharge from the hospital. This usually happens within 2 weeks after the procedure.If this occurs, force fluids again as you did in the hospital and reduce your activity.  °Bowel Function °· You may experience some constipation after surgery. This can be minimized by increasing fluids and fiber in your diet. Drink enough water and fluids to keep your urine clear or pale yellow.  °· A stool softener may be prescribed for use at home. Do not strain to move your bowels.  °· If you are requiring increased pain medicine, it is important that you take stool softeners to prevent constipation. This will help to promote proper healing by reducing the need to strain to move your bowels.  °Sexual Activity °· Semen movement in the opposite direction and into the bladder (  retrograde ejaculation) may occur. Since the semen passes into the bladder, cloudy urine can occur the first time you urinate after intercourse. Or, you may not have an ejaculation during erection. Ask your caregiver when you can resume sexual activity. Retrograde ejaculation and reduced semen discharge should not reduce one's pleasure of intercourse.  °Postoperative Visit °· Arrange the date and time of your after surgery visit with your caregiver.  °Return to Work °· After your recovery is complete, you will  be able to return to work and resume all activities. Your caregiver will inform you when you can return to work.  °Foley Catheter Care °A soft, flexible tube (Foley catheter) may have been placed in your bladder to drain urine and fluid. Follow these instructions: °Taking Care of the Catheter °· Keep the area where the catheter leaves your body clean.  °· Attach the catheter to the leg so there is no tension on the catheter.  °· Keep the drainage bag below the level of the bladder, but keep it OFF the floor.  °· Do not take long soaking baths. Your caregiver will give instructions about showering.  °· Wash your hands before touching ANYTHING related to the catheter or bag.  °· Using mild soap and warm water on a washcloth:  °· Clean the area closest to the catheter insertion site using a circular motion around the catheter.  °· Clean the catheter itself by wiping AWAY from the insertion site for several inches down the tube.  °· NEVER wipe upward as this could sweep bacteria up into the urethra (tube in your body that normally drains the bladder) and cause infection.  °· Place a small amount of sterile lubricant at the tip of the penis where the catheter is entering.  °Taking Care of the Drainage Bags °· Two drainage bags may be taken home: a large overnight drainage bag, and a smaller leg bag which fits underneath clothing.  °· It is okay to wear the overnight bag at any time, but NEVER wear the smaller leg bag at night.  °· Keep the drainage bag well below the level of your bladder. This prevents backflow of urine into the bladder and allows the urine to drain freely.  °· Anchor the tubing to your leg to prevent pulling or tension on the catheter. Use tape or a leg strap provided by the hospital.  °· Empty the drainage bag when it is 1/2 to 3/4 full. Wash your hands before and after touching the bag.  °· Periodically check the tubing for kinks to make sure there is no pressure on the tubing which could restrict  the flow of urine.  °Changing the Drainage Bags °· Cleanse both ends of the clean bag with alcohol before changing.  °· Pinch off the rubber catheter to avoid urine spillage during the disconnection.  °· Disconnect the dirty bag and connect the clean one.  °· Empty the dirty bag carefully to avoid a urine spill.  °· Attach the new bag to the leg with tape or a leg strap.  °Cleaning the Drainage Bags °· Whenever a drainage bag is disconnected, it must be cleaned quickly so it is ready for the next use.  °· Wash the bag in warm, soapy water.  °· Rinse the bag thoroughly with warm water.  °· Soak the bag for 30 minutes in a solution of white vinegar and water (1 cup vinegar to 1 quart warm water).  °· Rinse with warm water.  °SEEK MEDICAL   CARE IF:  °· You have chills or night sweats.  °· You are leaking around your catheter or have problems with your catheter. It is not uncommon to have sporadic leakage around your catheter as a result of bladder spasms. If the leakage stops, there is not much need for concern. If you are uncertain, call your caregiver.  °· You develop side effects that you think are coming from your medicines.  °SEEK IMMEDIATE MEDICAL CARE IF:  °· You are suddenly unable to urinate. Check to see if there are any kinks in the drainage tubing that may cause this. If you cannot find any kinks, call your caregiver immediately. This is an emergency.  °· You develop shortness of breath or chest pains.  °· Bleeding persists or clots develop in your urine.  °· You have a fever.  °· You develop pain in your back or over your lower belly (abdomen).  °· You develop pain or swelling in your legs.  °· Any problems you are having get worse rather than better.  °MAKE SURE YOU:  °· Understand these instructions.  °· Will watch your condition.  °· Will get help right away if you are not doing well or get worse.  °Document Released: 05/31/2005 Document Revised: 02/10/2011 Document Reviewed: 01/22/2009 °[employer]®  Patient Information ©2012 [employer].Transurethral Resection of the Prostate °Care After °Refer to this sheet in the next few weeks. These discharge instructions provide you with general information on caring for yourself after you leave the hospital. Your caregiver may also give you specific instructions. Your treatment has been planned according to the most current medical practices available, but unavoidable complications sometimes occur. If you have any problems or questions after discharge, please call your caregiver. °

## 2021-06-22 NOTE — Interval H&P Note (Signed)
History and Physical Interval Note:  06/22/2021 7:31 AM  William [name]  has presented today for surgery, with the diagnosis of BENIGN PROSTATIC HYPERPLASIA AND BLADDER CANCER.  The various methods of treatment have been discussed with the patient and family. After consideration of risks, benefits and other options for treatment, the patient has consented to  Procedure(s): TRANSURETHRAL RESECTION OF BLADDER TUMOR WITH GEMCITABINE (N/A) TRANSURETHRAL RESECTION OF THE PROSTATE (TURP) (N/A) CYSTOSCOPY WITH RETROGRADE PYELOGRAM (Bilateral) as a surgical intervention.  The patient's history has been reviewed, patient examined, no change in status, stable for surgery.  I have reviewed the patient's chart and labs.  Questions were answered to the patient's satisfaction.     [name]

## 2021-06-22 NOTE — Transfer of Care (Signed)
Immediate Anesthesia Transfer of Care Note  Patient: William [name]  Procedure(s) Performed: Procedure(s) (LRB): TRANSURETHRAL RESECTION OF BLADDER TUMOR (N/A) TRANSURETHRAL RESECTION OF THE PROSTATE (TURP) (N/A) CYSTOSCOPY WITH RETROGRADE PYELOGRAM (Bilateral)  Patient Location: PACU  Anesthesia Type: General  Level of Consciousness: awake, oriented, sedated and patient cooperative  Airway & Oxygen Therapy: Patient Spontanous Breathing and Patient connected to face mask oxygen  Post-op Assessment: Report given to PACU RN and Post -op Vital signs reviewed and stable  Post vital signs: Reviewed and stable  Complications: No apparent anesthesia complications Last Vitals:  Vitals Value Taken Time  BP 133/65 06/22/21 0903  Temp    Pulse 78 06/22/21 0909  Resp 17 06/22/21 0909  SpO2 95 % 06/22/21 0909  Vitals shown include unvalidated device data.  Last Pain: There were no vitals filed for this visit.       Complications: No notable events documented.

## 2021-06-22 NOTE — Op Note (Signed)
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  Bladder tumors 2.  Prostate chips  Estimated blood loss: Less than 50 mL  Drains: 6 French three-way Foley catheter, latex free  Indications: 81 year old male 8 years out from TURP.  Recent evaluation for gross hematuria revealed the patient to have recurrent obstructive BPH as well as multifoca bladder tumors.  These are just lateral to the right ureteral orifice, right lateral bladder wall, anterior bladder neck.  He presents at this time for cystoscopy, retrograde pyelograms, transurethral resection of bladder tumors and TURP.  I discussed the procedure with the patient and his wife.  Risks and complications as well as expected outcomes were discussed.  These include but are not limited to bleeding, need for irrigation, infection, need for catheterization, anesthetic complications.  He understands and desires to proceed.  Findings: Urethra was normal without lesions.  Prostate revealed regrowth of lateral lobes, more in the apical region.  There were no urothelial lesions within the prostatic urethra.  Bladder was inspected circumferentially  Ureteral orifices were normal in configuration and location.  Papillary lesion, the predominant lesion was located just lateral to the right ureteral orifice.  Approximately 3 cm in diameter.  Anterior to this were low-lying papillary lesions approximately 2 cm in diameter.  Additionally, a small amount of low lying  papillary lesions noted in the midline at the bladder neck anteriorly.  Retrograde studies of both ureters revealed normal ureters throughout without evidence of filling defects, stricture or hydronephrosis.  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  Description of procedure: The patient was properly identified in the holding area.  He was taken to the operating room where general endotracheal anesthetic was administered.  He was placed in the dorsolithotomy position.  Genitalia and perineum were prepped, draped, proper timeout performed.  56 French panendoscope was advanced into the bladder.  The above-mentioned findings were noted.  Retrograde study of both ureters and pyelocalyceal systems were performed using dilute Omnipaque solution and a 6 [place] open-ended catheter.  The above-mentioned findings were noted.  Following this, the cystoscope was removed.  I then passed a 26 French resectoscope sheath using the visual obturator.  Resectoscope and cutting loop were placed.  All of the above-mentioned bladder tumors were resected well into the muscular tissue.  It was somewhat difficult to reach the anterior bladder tumor area.  However, with suprapubic pressure/Crede maneuver this was performed.  All abnormal urothelium was resected.  Following this, careful hemostasis was achieved using the cautery current.  Bladder tumor fragments were irrigated from the bladder and sent labeled "bladder tumor" to pathology.  At this point, TURP was performed.  Although the amount of tissue resected was somewhat small, there was significant obstruction, especially in the apical region.  Circumferential resection was performed with all obstructing tissue excised down to the surgical capsule.  Chips were sent for permanent specimen labeled "prostate chips".  The entire prostatic fossa was cauterized.  There was  adequate hemostasis at this point.  Inspection of all resected areas in the bladder and repeat inspection  of the prostatic fossa revealed impeccable hemostasis.  At this point, the scope was removed.  13 French silicone three-way Foley catheter was placed.  Balloon filled with 45 cc of water.  Gentle traction was placed.  This was hooked to dependent drainage and also CBI with saline.  CBI was run minimally.  The patient was then awakened, extubated and taken to the PACU in stable condition.  He tolerated the procedure well.  In the PACU, 2 g of gemcitabine was placed.  CBI was clamped temporarily.  Gemcitabine was left indwelling for 1 hour and then drained.  CBI was then restarted.

## 2021-06-23 ENCOUNTER — Other Ambulatory Visit (HOSPITAL_BASED_OUTPATIENT_CLINIC_OR_DEPARTMENT_OTHER): Payer: Self-pay

## 2021-06-23 ENCOUNTER — Other Ambulatory Visit (HOSPITAL_COMMUNITY): Payer: Self-pay

## 2021-06-23 ENCOUNTER — Encounter (HOSPITAL_BASED_OUTPATIENT_CLINIC_OR_DEPARTMENT_OTHER): Payer: Self-pay | Admitting: Urology

## 2021-06-23 DIAGNOSIS — C670 Malignant neoplasm of trigone of bladder: Secondary | ICD-10-CM | POA: Diagnosis not present

## 2021-06-23 LAB — SURGICAL PATHOLOGY

## 2021-06-23 MED ORDER — ONDANSETRON HCL 4 MG/2ML IJ SOLN
4.0000 mg | Freq: Once | INTRAMUSCULAR | Status: AC
  Administered 2021-06-23: 4 mg via INTRAVENOUS

## 2021-06-23 MED ORDER — LIDOCAINE HCL URETHRAL/MUCOSAL 2 % EX GEL: 1.0000 "application " | Freq: Once | CUTANEOUS | Status: DC

## 2021-06-23 MED ORDER — LIDOCAINE HCL URETHRAL/MUCOSAL 2 % EX GEL
CUTANEOUS | Status: AC
  Filled 2021-06-23: qty 11

## 2021-06-23 MED ORDER — LORAZEPAM 1 MG PO TABS
1.0000 mg | ORAL_TABLET | Freq: Once | ORAL | Status: AC
  Administered 2021-06-23: 1 mg via ORAL
  Filled 2021-06-23: qty 1

## 2021-06-23 MED ORDER — ONDANSETRON HCL 4 MG/2ML IJ SOLN
INTRAMUSCULAR | Status: AC
  Filled 2021-06-23: qty 2

## 2021-06-23 MED ORDER — CEPHALEXIN 500 MG PO CAPS
500.0000 mg | ORAL_CAPSULE | Freq: Two times a day (BID) | ORAL | 0 refills | Status: DC
  Filled 2021-06-23 (×2): qty 6, 3d supply, fill #0

## 2021-06-23 MED ORDER — HYDROCODONE-ACETAMINOPHEN 5-325 MG PO TABS
ORAL_TABLET | ORAL | Status: AC
  Filled 2021-06-23: qty 1

## 2021-06-23 MED ORDER — PANTOPRAZOLE SODIUM 40 MG PO TBEC
DELAYED_RELEASE_TABLET | ORAL | Status: AC
  Filled 2021-06-23: qty 1

## 2021-06-23 NOTE — Anesthesia Postprocedure Evaluation (Signed)
Anesthesia Post Note  Patient: William [name]  Procedure(s) Performed: TRANSURETHRAL RESECTION OF BLADDER TUMOR TRANSURETHRAL RESECTION OF THE PROSTATE (TURP) CYSTOSCOPY WITH RETROGRADE PYELOGRAM (Bilateral)     Patient location during evaluation: PACU Anesthesia Type: General Level of consciousness: sedated and patient cooperative Pain management: pain level controlled Vital Signs Assessment: post-procedure vital signs reviewed and stable Respiratory status: spontaneous breathing Cardiovascular status: stable Anesthetic complications: no   No notable events documented.  Last Vitals:  Vitals:   06/23/21 0523 06/23/21 1039  BP: (!) 161/81 (!) 163/67  Pulse: 78 83  Resp: 20 (!) 22  Temp: 37.1 C 36.8 C  SpO2: 93% 94%    Last Pain:  Vitals:   06/23/21 1039  PainSc: 2                  Nolon Nations

## 2021-06-23 NOTE — Progress Notes (Signed)
Bladder scanned for 317.  Patient has slight urge to void, he request more time to void on his own before placing catheter.

## 2021-06-29 ENCOUNTER — Other Ambulatory Visit (HOSPITAL_BASED_OUTPATIENT_CLINIC_OR_DEPARTMENT_OTHER): Payer: Self-pay

## 2021-06-30 DIAGNOSIS — M47816 Spondylosis without myelopathy or radiculopathy, lumbar region: Secondary | ICD-10-CM | POA: Diagnosis not present

## 2021-06-30 NOTE — Discharge Summary (Signed)
Date of admission: 06/22/2021  Date of discharge: 1.10.2023  Admission diagnosis: BPH , bladder cancer   Discharge diagnosis: Same  Secondary diagnoses: None  History and Physical: For full details, please see admission history and physical. Briefly, William [name] is a 81 y.o. male with BPH.   Hospital Course:  The patient underwent transurethral resection of prostate on 06/23/2021. Continuous bladder irrigation was initiated post-operatively following placement of gemcitabine and drainage of that.. He tolerated the procedure well and was transferred to the floor after receiving routine post-operative care. His diet was gradually advanced, and his pain was controlled with oral analgesics.  By POD1, his urine had cleared on slow drip CBI, so CBI was stopped. His hematuria remained mild with CBI stopped and after ambulation. CBI was disconnected, and the 3rd port of his foley catheter was plugged.   By later on POD 1 he was tolerating a regular diet, ambulating, having good urine output with mild hematuria via foley catheter, and having pain well-controlled with oral analgesics. Thus, he was deemed appropriate for discharge home. His foley catheter was discontinued the morning of discharge, but he could not void and this was replaced.  Laboratory values:  No results for input(s): HGB, HCT in the last 72 hours. No results for input(s): CREATININE in the last 72 hours. Physical Exam:  General: Alert and oriented CV: Regular rate Lungs: NWOB on RA Abdomen: Soft, nondistended, nontender GU: Foley in place draining clear urine without clots  Extremities: Warm and well-perfused   Disposition: Home  Discharge instruction: The patient was instructed to be ambulatory but told to refrain from heavy lifting, strenuous activity, or driving while on narcotics.   Discharge medications:  Allergies as of 06/23/2021       Reactions   Strawberry Extract Hives   Latex Itching   Adhesive [tape] Other  (See Comments)   BLISTER   Statins Other (See Comments)   myalgias        Medication List     STOP taking these medications    tamsulosin 0.4 MG Caps capsule Commonly known as: FLOMAX       TAKE these medications    acetaminophen 500 MG tablet Commonly known as: TYLENOL Take 500 mg by mouth every 6 (six) hours as needed (1 tab as needed).   amLODipine 10 MG tablet Commonly known as: NORVASC TAKE 1 TABLET BY MOUTH DAILY What changed: Another medication with the same name was removed. Continue taking this medication, and follow the directions you see here.   amLODipine 10 MG tablet Commonly known as: NORVASC Take 1 tablet by mouth daily What changed: Another medication with the same name was removed. Continue taking this medication, and follow the directions you see here.   aspirin 325 MG EC tablet Take 325 mg by mouth daily.   carbamazepine 100 MG 12 hr tablet Commonly known as: TEGRETOL XR TAKE 1 TABLET BY MOUTH EVERY MORNING AND 2 AT BEDTIME   cephALEXin 500 MG capsule Commonly known as: KEFLEX Take 1 capsule by mouth 2 times daily What changed: Another medication with the same name was changed. Make sure you understand how and when to take each.   cephALEXin 500 MG capsule Commonly known as: KEFLEX Take 1 capsule (500 mg total) by mouth 2 (two) times daily. What changed:  how much to take how to take this when to take this   docusate sodium 100 MG capsule Commonly known as: COLACE Take 100 mg by mouth daily.  Eszopiclone 3 MG Tabs TAKE 1 TABLET BY MOUTH IMMEDIATELY BEFORE BEDTIME AS NEEDED   famotidine 20 MG tablet Commonly known as: PEPCID Take 20 mg by mouth 2 (two) times daily.   fenofibrate 160 MG tablet Take 1 tablet (160 mg total) by mouth daily.   FLAX SEED OIL PO Take by mouth daily.   Fluad Quadrivalent 0.5 ML injection Generic drug: influenza vaccine adjuvanted Inject into the muscle.   fluticasone 0.05 % cream Commonly known  as: CUTIVATE   lisinopril-hydrochlorothiazide 10-12.5 MG tablet Commonly known as: ZESTORETIC TAKE 1 TABLET BY MOUTH DAILY What changed: Another medication with the same name was removed. Continue taking this medication, and follow the directions you see here.   lisinopril-hydrochlorothiazide 10-12.5 MG tablet Commonly known as: ZESTORETIC Take 1 tablet by mouth daily What changed: Another medication with the same name was removed. Continue taking this medication, and follow the directions you see here.   LORazepam 0.5 MG tablet Commonly known as: ATIVAN Take 2 tablets by mouth every mornig and 1 tablet at bedtime.   multivitamin tablet Take 1 tablet by mouth daily.   NONFORMULARY OR COMPOUNDED ITEM [facility]:  Antifungal topical - Terbinafine 3%, Fluconazole 2%, Tea Tree Oil 5%, Urea 10/%,, Ibuprofen 2% in DMSO Suspension #52ml. Apply to the affected nail(s) once (at bedtime) or twice daily.   ondansetron 4 MG disintegrating tablet Commonly known as: Zofran ODT Take 1 tablet (4 mg total) by mouth every 8 (eight) hours as needed for nausea or vomiting.   pantoprazole 40 MG tablet Commonly known as: PROTONIX TAKE 1 TABLET (40 MG TOTAL) BY MOUTH 2 (TWO) TIMES DAILY BEFORE A MEAL.   Pfizer COVID-19 Vac Bivalent injection Generic drug: COVID-19 mRNA bivalent vaccine Therapist, music) Inject into the muscle.   Pfizer-BioNT COVID-19 Vac-TriS Susp injection Generic drug: COVID-19 mRNA Vac-TriS (Pfizer) Inject into the muscle.   sildenafil 100 MG tablet Commonly known as: VIAGRA Take 1 tablet (100 mg total) by mouth as needed up to once daily.   tiZANidine 4 MG tablet Commonly known as: Zanaflex Take 1 tablet (4 mg total) by mouth every 6 (six) hours as needed for muscle spasms.   traMADol 50 MG tablet Commonly known as: ULTRAM Take 1 - 2 tablets by mouth every 6 hours as needed for pain   traMADol 50 MG tablet Commonly known as: ULTRAM Take 1 tablet by mouth 3 times a  day as needed.   triamcinolone cream 0.1 % Commonly known as: KENALOG Apply 1 application topically 2 (two) times daily.   Vitamin D 50 MCG (2000 UT) Caps Take by mouth.          Followup:   Follow-up Information     Franchot Gallo, MD Follow up.   Specialty: Urology Why: We will call you to schedule a trial of urinating. Contact information: [address] [place] [address] [phone]

## 2021-07-08 ENCOUNTER — Other Ambulatory Visit (HOSPITAL_BASED_OUTPATIENT_CLINIC_OR_DEPARTMENT_OTHER): Payer: Self-pay

## 2021-07-14 ENCOUNTER — Ambulatory Visit: Payer: PPO | Admitting: Physical Therapy

## 2021-07-14 NOTE — Progress Notes (Signed)
NEUROLOGY FOLLOW UP OFFICE NOTE  [name] [id number]  Assessment/Plan:   1.   Vascular major neurocognitive disorder 3.  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  Lumbar spondylosis 5.  Hypertension 6.  Hyperlipidemia 7.  History of right CEA   1.  Check routine bilateral carotid ultrasound. 2.  Secondary stroke prevention as managed by PCP: -  ASA 325mg  daily - Blood pressure control - Glycemic control.  Hgb A1c goal less than 7 -  LDL goal less than 70.  Unable to tolerate statin.  Consider Repatha 4.  Mediterranean diet 5.  Follow up 1 year   Subjective:  William [name] is a 81 year old man with dementia, CKD, HTN, CAD, HLD and history of CVA who follows up for dementia.William [name]  He is accompanied by his wife who supplements history.    UPDATE: Current medications:  ASA 325mg ; amlodipine, Tegretol-XR 100mg  at bedtime; lorazepam PRN; tramadol 50mg  Q8h PRN    Last seen in February 2022.  At that time, he reported new moderate non-throbbing headache back of head every night.  Ongoing for two months.  No neck pain.  It occurs in the evening while sitting talking or watching TV.  Treats with Tylenol almost everyday.  Lasts a half hour.  No change in medication just prior to onset.  Similar headache when he had his stroke.  MRI of brain without contrast on 08/14/2020 personally reviewed showed no new acute/subacute intracranial abnormality.  Headaches resolved.  He has been experiencing increased anxiety due to his comorbidities.  He was feeling more forgetful, so his psychiatrist decreased his lorazepam.  Anxiety has increased.  Forgetful spells are improved but nothing significant.  HISTORY: 1.  Lumbar spondylosis with radiculopathy Prior to COVID, he was going to the gym and seeing a personal trainer but it stopped once the pandemic hit.  Since the pandemic, he has been sedentary.  He walks the dog once or twice a week.  He started having bilateral leg pain later that year in  2020.  No associated back pain.  No shooting pain.  It is described as an aching in his calves.  No associated numbness.  He also reports weakness.  When he walks, he has trouble picking up his legs.  He seems to shuffle his feet.  When he gets up to stand, he feels like he can fall forward.  He had one fall while standing at the toilet and he bent forward to spit in toilet.  No change in bowel habit.  He was having urinary issues so he was taken off of Flomax.    Vascular US ABI from 12/08/18 showed abnormal toe-brachial index in both lower extremities but otherwise unremarkable.  MRI of cervical spine without contrast from 03/10/2019 showed some cervical degenerative disc disease and spondylosis with mild foraminal narrowing but no spinal stenosis or compressive myelopathy.  He does have history of low back pain and lumbar spondylosis.  Due to ongoing unsteady gait with bilateral leg pain, MRI of lumbar spine was performed on 11/14/2019, which was personally reviewed and demonstrated degenerative changes stable compared to 2012 with L3-4 right subarticular stenosis.  He has had mixed response to epidural injections.     2.  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  He has  some residual left sided weakness and facial weakness.  He underwent left carotid endarterectomy in 2010 for asymptomatic stenosis.   Beginning around 2013, he and his wife have noted a gradual progression and cognitive deficits. Onset correlated during a period when he was experiencing dizziness and syncope secondary to orthostatic hypotension. At that time, he was dehydrated and not drinking much fluids. He was instructed to start increasing his fluid intake as well as his sodium intake. Now, he tries to drink 3 bottles of water per day. He particularly  notes confusion regarding how to perform certain everyday tasks. For example, it takes him a long time to unload the dishwasher because he has trouble figuring out where to put the dishes. He use to enjoy cooking and preparing meals. Now he has difficulty cooking and can only see things up in the microwave. He also has been experiencing dressing apraxia. Sometimes he will put both his feet into one leg of his pants. Other times, he has difficulty buttoning his shirts. He denies any language dysfunction such as difficulty understanding other people or other people not understanding him. He has no trouble reading or writing. His short-term and long-term memory are pretty good. He denies any difficulty with face recognition. He denies hallucinations or delusions. He has not had any change in his personality or behavior. He still drives but very seldom and only locally. He has not had any problems with accidents or near accidents, but he says he drives slowly because he is very nervous and cautious. He does have history of anxiety and often shakes when he gets nervous. He denies any family history of dementia. However, there is a psychiatric history with his mother and sister.  He denies history of alcohol abuse or drug use.   He underwent neuropsychological testing on 10/25/13 with Dr. Macarthur Critchley at [facility] suggested mild dementia due to his stroke, but also underlying neurodegenerative process.  Memory and left hemispheric medial temporal functions were intact.  Findings showed deficits in visual processing and executive dysfunction.  Onset of visual-spatial deficits and apraxia may suggest occipital lobe involvement, such as due to an Alzheimers variant called posterior cortical atrophy.  MRI from May 2015 revealed global atrophy but it does not seem more prominent in the occipital lobes.  He developed increased confusion in February 2021.  At physical therapy, he was noted to have increased  difficulty using his left side (affected side from his stroke).  He had difficulty performing finger-thumb tapping and had trouble gripping the handle of the exercise machine with his left hand.  He also has had acute cognitive changes.  He started having trouble dialing his daughter's phone number which is new.  MRI of brain without contrast performed on 08/15/2019 was personally reviewed and showed remote large right hemispheric stroke and chronic small vessel ischemic changes, stable compared to prior MRI from 2015, no acute intracranial abnormality.   He underwent repeat neuropsychological testing on 11/29/2019 which demonstrated findings primarily consistent with major vascular neurocognitive disorder, relatively stable compared to prior testing in 2015.  Underlying neurodegenerative disorder could not be ruled out but clinically has been stable.     Prior medication:  Aricept (diarrhea, vivid dreams); galantamine (nausea)   01/30/20 Carotid doppler:  1-39% bilateral ICA stenosis. 10/16/13 LABS:  B12 720, TSH 1.668 10/19/13 MRI BRAIN WO:  stable remote large right MCA territory infarct.  Chronic microvascular ischemic changes. 05/21/13 LABS:  Hgb A1c 6 05/17/13 Carotid doppler: bilateral 1-39% stenosis  of the right proximal ICA and no stenosis of the left ICA. 03/11/12 MRI Brain wo contrast:  Remote large right MCA infarct. Dementia with behavioral symptoms.  Etiology unclear.  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   Mohs surgery jan 2016    Bladder stone    BPH (benign prostatic hyperplasia) 08/06/2007   Carotid artery occlusion    Chronic kidney disease 2014   Stage III   Closed fracture of fifth metacarpal bone 05/15/2015   Eczema    Fasting  hyperglycemia 12/21/2006   GERD (gastroesophageal reflux disease)    History of carotid artery stenosis    S/P BILATERAL CEA   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   HTN (hypertension) 07/19/2015   Hyperlipidemia    Hypertension    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   Mild, related to stroke history   Nocturia    Renal insufficiency 06/25/2013   S/P carotid endarterectomy    BILATERAL ICA--  PATENT PER DUPLEX  05-19-2012   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     amLODipine (NORVASC) 10 MG tablet TAKE 1 TABLET BY MOUTH DAILY 90 tablet 3   amLODipine (NORVASC) 10 MG tablet Take 1 tablet by mouth daily 90 tablet 3   aspirin 325 MG EC tablet Take 325 mg by mouth daily.     carbamazepine (TEGRETOL XR) 100 MG 12 hr tablet TAKE 1 TABLET BY MOUTH EVERY MORNING AND 2 AT BEDTIME 270 tablet 1   cephALEXin (KEFLEX) 500 MG capsule Take 1 capsule by mouth 2 times daily 14 capsule 0   cephALEXin (KEFLEX) 500 MG capsule Take 1 capsule (500 mg total) by mouth 2 (two) times daily. 6 capsule 0   Cholecalciferol (VITAMIN D) 2000 UNITS CAPS Take by mouth.     COVID-19 mRNA bivalent vaccine, Pfizer, injection Inject into the muscle. 0.3 mL 0   COVID-19 mRNA Vac-TriS, Pfizer, (PFIZER-BIONT COVID-19 VAC-TRIS) SUSP injection Inject into the muscle. 0.3 mL 1   docusate sodium (COLACE) 100 MG capsule Take 100 mg by mouth daily.     Eszopiclone 3 MG TABS TAKE 1 TABLET BY MOUTH IMMEDIATELY BEFORE BEDTIME AS NEEDED 90 tablet 2   famotidine (PEPCID) 20 MG tablet Take 20 mg by mouth 2 (two) times daily.     fenofibrate 160 MG tablet Take 1 tablet (160 mg total) by mouth daily. 90 tablet 1    Flaxseed, Linseed, (FLAX SEED OIL PO) Take by mouth daily.     fluticasone (CUTIVATE) 0.05 % cream   3   influenza vaccine adjuvanted (FLUAD) 0.5 ML injection Inject into the muscle. 0.5 mL 0   lisinopril-hydrochlorothiazide (ZESTORETIC) 10-12.5 MG tablet TAKE 1 TABLET BY MOUTH DAILY 30 tablet 5   lisinopril-hydrochlorothiazide (ZESTORETIC) 10-12.5 MG tablet Take 1 tablet by mouth daily 30 tablet 3   LORazepam (ATIVAN) 0.5 MG tablet Take 2 tablets by mouth every mornig and 1 tablet  at bedtime. 90 tablet 5   Multiple Vitamin (MULTIVITAMIN) tablet Take 1 tablet by mouth daily.     NONFORMULARY OR COMPOUNDED ITEM [facility]:  Antifungal topical - Terbinafine 3%, Fluconazole 2%, Tea Tree Oil 5%, Urea 10/%,, Ibuprofen 2% in DMSO Suspension #11ml. Apply to the affected nail(s) once (at bedtime) or twice daily. 100 each 2   ondansetron (ZOFRAN ODT) 4 MG disintegrating tablet Take 1 tablet (4 mg total) by mouth every 8 (eight) hours as needed for nausea or vomiting. 20 tablet 0   pantoprazole (PROTONIX) 40 MG tablet TAKE 1 TABLET (40 MG TOTAL) BY MOUTH 2 (TWO) TIMES DAILY BEFORE A MEAL. 60 tablet 2   sildenafil (VIAGRA) 100 MG tablet Take 1 tablet (100 mg total) by mouth as needed up to once daily. 10 tablet 11   tiZANidine (ZANAFLEX) 4 MG tablet Take 1 tablet (4 mg total) by mouth every 6 (six) hours as needed for muscle spasms. 30 tablet 0   traMADol (ULTRAM) 50 MG tablet Take 1 - 2 tablets by mouth every 6 hours as needed for pain 40 tablet 0   traMADol (ULTRAM) 50 MG tablet Take 1 tablet by mouth 3 times a day as needed. 30 tablet 0   triamcinolone cream (KENALOG) 0.1 % Apply 1 application topically 2 (two) times daily. 30 g 0   Current Facility-Administered Medications on File Prior to Visit  Medication Dose Route Frequency Provider Last Rate Last Admin   gemcitabine (GEMZAR) chemo syringe for bladder instillation 2,000 mg  2,000 mg Bladder Instillation Once Franchot Gallo, MD         ALLERGIES: Allergies  Allergen Reactions   Strawberry Extract Hives   Latex Itching   Adhesive [Tape] Other (See Comments)    BLISTER   Statins Other (See Comments)    myalgias    FAMILY HISTORY: Family History  Problem Relation Age of Onset   Heart disease Mother        CHF   Bipolar disorder Mother    Heart disease Father        CHF      Objective:  Blood pressure 126/60, pulse 62, height 5\' 11"  (1.803 m), weight 208 lb 9.6 oz (94.6 kg), SpO2 100 %. General: No acute distress.  Patient appears well-groomed.   Head:  Normocephalic/atraumatic Eyes:  Fundi examined but not visualized Neck: supple, no paraspinal tenderness, full range of motion Heart:  Regular rate and rhythm Lungs:  Clear to auscultation bilaterally Back: No paraspinal tenderness Neurological Exam:  [employer] Mental Exam 08/07/2020 07/15/2021  Weekday Correct 1 1  Current year 1 1  What state are we in? 1 1  Amount spent 1 1  Amount left 2 2  # of Animals 1 1  5  objects recall 5 2  Number series 1 1  Hour markers 0 0  Time correct 0 2  Placed X in triangle correctly 1 1  Largest Figure 1 1  Name of male 2 2  Date back to work 0 0  Type of work 2 2  State she lived in 2 2  Total score 21 20   Left sided neglect noted on clock drawing as well as extinction to double simultaneous tactile stimulation.  Left lower homonymous quadrantanopia.  Otherwise, CN II-XII intact. Bulk and tone normal, muscle strength 5-/5 left deltoid and hip flexion, otherwise 5/5 throughout.  Sensation to light touch  intact.  Deep tendon reflexes 2+ throughout, toes downgoing.  Finger to nose  testing intact.  Mild shuffling gait.  Romberg with sway.   Metta Clines, DO  CC: Roma Schanz, DO

## 2021-07-15 ENCOUNTER — Encounter: Payer: Self-pay | Admitting: Neurology

## 2021-07-15 ENCOUNTER — Ambulatory Visit: Payer: PPO | Admitting: Neurology

## 2021-07-15 ENCOUNTER — Other Ambulatory Visit: Payer: Self-pay

## 2021-07-15 VITALS — BP 126/60 | HR 62 | Ht 71.0 in | Wt 208.6 lb

## 2021-07-15 DIAGNOSIS — E785 Hyperlipidemia, unspecified: Secondary | ICD-10-CM

## 2021-07-15 DIAGNOSIS — I69334 Monoplegia of upper limb following cerebral infarction affecting left non-dominant side: Secondary | ICD-10-CM | POA: Diagnosis not present

## 2021-07-15 DIAGNOSIS — I10 Essential (primary) hypertension: Secondary | ICD-10-CM | POA: Diagnosis not present

## 2021-07-15 DIAGNOSIS — F0390 Unspecified dementia without behavioral disturbance: Secondary | ICD-10-CM | POA: Diagnosis not present

## 2021-07-15 DIAGNOSIS — Z8673 Personal history of transient ischemic attack (TIA), and cerebral infarction without residual deficits: Secondary | ICD-10-CM | POA: Diagnosis not present

## 2021-07-15 DIAGNOSIS — Z9889 Other specified postprocedural states: Secondary | ICD-10-CM | POA: Diagnosis not present

## 2021-07-15 DIAGNOSIS — M47816 Spondylosis without myelopathy or radiculopathy, lumbar region: Secondary | ICD-10-CM

## 2021-07-15 NOTE — Addendum Note (Signed)
Addended byTomi Likens, [place] R on: 07/15/2021 02:40 PM   Modules accepted: Orders

## 2021-07-15 NOTE — Patient Instructions (Addendum)
Check bilateral carotid ultrasound Continue ASA Follow up in one year

## 2021-07-21 ENCOUNTER — Other Ambulatory Visit (HOSPITAL_BASED_OUTPATIENT_CLINIC_OR_DEPARTMENT_OTHER): Payer: Self-pay

## 2021-07-21 ENCOUNTER — Other Ambulatory Visit: Payer: Self-pay | Admitting: Urology

## 2021-07-21 MED ORDER — TAMSULOSIN HCL 0.4 MG PO CAPS
0.4000 mg | ORAL_CAPSULE | Freq: Two times a day (BID) | ORAL | 11 refills | Status: DC
  Filled 2021-07-21: qty 60, 30d supply, fill #0

## 2021-07-22 ENCOUNTER — Emergency Department (HOSPITAL_COMMUNITY)
Admission: EM | Admit: 2021-07-22 | Discharge: 2021-07-22 | Disposition: A | Discharge status: Home / Self Care | Payer: PPO | Attending: Emergency Medicine | Admitting: Emergency Medicine

## 2021-07-22 ENCOUNTER — Encounter (HOSPITAL_COMMUNITY): Payer: Self-pay

## 2021-07-22 ENCOUNTER — Other Ambulatory Visit: Payer: Self-pay

## 2021-07-22 ENCOUNTER — Emergency Department (HOSPITAL_COMMUNITY): Payer: PPO

## 2021-07-22 ENCOUNTER — Other Ambulatory Visit: Payer: PPO

## 2021-07-22 DIAGNOSIS — R29810 Facial weakness: Secondary | ICD-10-CM | POA: Insufficient documentation

## 2021-07-22 DIAGNOSIS — R404 Transient alteration of awareness: Secondary | ICD-10-CM | POA: Insufficient documentation

## 2021-07-22 DIAGNOSIS — I129 Hypertensive chronic kidney disease with stage 1 through stage 4 chronic kidney disease, or unspecified chronic kidney disease: Secondary | ICD-10-CM | POA: Insufficient documentation

## 2021-07-22 DIAGNOSIS — R262 Difficulty in walking, not elsewhere classified: Secondary | ICD-10-CM | POA: Diagnosis not present

## 2021-07-22 DIAGNOSIS — R410 Disorientation, unspecified: Secondary | ICD-10-CM | POA: Diagnosis not present

## 2021-07-22 DIAGNOSIS — Z79899 Other long term (current) drug therapy: Secondary | ICD-10-CM | POA: Insufficient documentation

## 2021-07-22 DIAGNOSIS — Z7982 Long term (current) use of aspirin: Secondary | ICD-10-CM | POA: Diagnosis not present

## 2021-07-22 DIAGNOSIS — Z20822 Contact with and (suspected) exposure to covid-19: Secondary | ICD-10-CM | POA: Diagnosis not present

## 2021-07-22 DIAGNOSIS — I639 Cerebral infarction, unspecified: Secondary | ICD-10-CM | POA: Diagnosis not present

## 2021-07-22 DIAGNOSIS — R531 Weakness: Secondary | ICD-10-CM | POA: Insufficient documentation

## 2021-07-22 DIAGNOSIS — R4182 Altered mental status, unspecified: Secondary | ICD-10-CM | POA: Diagnosis not present

## 2021-07-22 DIAGNOSIS — N3000 Acute cystitis without hematuria: Secondary | ICD-10-CM | POA: Insufficient documentation

## 2021-07-22 DIAGNOSIS — G9389 Other specified disorders of brain: Secondary | ICD-10-CM | POA: Diagnosis not present

## 2021-07-22 DIAGNOSIS — I6381 Other cerebral infarction due to occlusion or stenosis of small artery: Secondary | ICD-10-CM | POA: Diagnosis not present

## 2021-07-22 DIAGNOSIS — Z91040 Latex allergy status: Secondary | ICD-10-CM | POA: Insufficient documentation

## 2021-07-22 DIAGNOSIS — R000 Tachycardia, unspecified: Secondary | ICD-10-CM | POA: Diagnosis not present

## 2021-07-22 DIAGNOSIS — I10 Essential (primary) hypertension: Secondary | ICD-10-CM | POA: Diagnosis not present

## 2021-07-22 DIAGNOSIS — N189 Chronic kidney disease, unspecified: Secondary | ICD-10-CM | POA: Diagnosis not present

## 2021-07-22 LAB — DIFFERENTIAL
Abs Immature Granulocytes: 0.04 10*3/uL (ref 0.00–0.07)
Basophils Absolute: 0 10*3/uL (ref 0.0–0.1)
Basophils Relative: 1 %
Eosinophils Absolute: 0.3 10*3/uL (ref 0.0–0.5)
Eosinophils Relative: 4 %
Immature Granulocytes: 1 %
Lymphocytes Relative: 30 %
Lymphs Abs: 2.2 10*3/uL (ref 0.7–4.0)
Monocytes Absolute: 0.7 10*3/uL (ref 0.1–1.0)
Monocytes Relative: 9 %
Neutro Abs: 4.1 10*3/uL (ref 1.7–7.7)
Neutrophils Relative %: 55 %

## 2021-07-22 LAB — COMPREHENSIVE METABOLIC PANEL
ALT: 58 U/L — ABNORMAL HIGH (ref 0–44)
AST: 35 U/L (ref 15–41)
Albumin: 3.6 g/dL (ref 3.5–5.0)
Alkaline Phosphatase: 86 U/L (ref 38–126)
Anion gap: 12 (ref 5–15)
BUN: 31 mg/dL — ABNORMAL HIGH (ref 8–23)
CO2: 22 mmol/L (ref 22–32)
Calcium: 9.1 mg/dL (ref 8.9–10.3)
Chloride: 105 mmol/L (ref 98–111)
Creatinine, Ser: 2.28 mg/dL — ABNORMAL HIGH (ref 0.61–1.24)
GFR, Estimated: 29 mL/min — ABNORMAL LOW (ref >60)
Glucose, Bld: 110 mg/dL — ABNORMAL HIGH (ref 70–99)
Potassium: 4.1 mmol/L (ref 3.5–5.1)
Sodium: 139 mmol/L (ref 135–145)
Total Bilirubin: 0.4 mg/dL (ref 0.3–1.2)
Total Protein: 7.4 g/dL (ref 6.5–8.1)

## 2021-07-22 LAB — I-STAT CHEM 8, ED
BUN: 31 mg/dL — ABNORMAL HIGH (ref 8–23)
Calcium, Ion: 1.17 mmol/L (ref 1.15–1.40)
Chloride: 108 mmol/L (ref 98–111)
Creatinine, Ser: 2.4 mg/dL — ABNORMAL HIGH (ref 0.61–1.24)
Glucose, Bld: 108 mg/dL — ABNORMAL HIGH (ref 70–99)
HCT: 35 % — ABNORMAL LOW (ref 39.0–52.0)
Hemoglobin: 11.9 g/dL — ABNORMAL LOW (ref 13.0–17.0)
Potassium: 4.2 mmol/L (ref 3.5–5.1)
Sodium: 141 mmol/L (ref 135–145)
TCO2: 23 mmol/L (ref 22–32)

## 2021-07-22 LAB — URINALYSIS, ROUTINE W REFLEX MICROSCOPIC
Bilirubin Urine: NEGATIVE
Glucose, UA: NEGATIVE mg/dL
Ketones, ur: NEGATIVE mg/dL
Nitrite: NEGATIVE
Protein, ur: 30 mg/dL — AB
Specific Gravity, Urine: 1.015 (ref 1.005–1.030)
WBC, UA: >50 WBC/hpf — ABNORMAL HIGH (ref 0–5)
pH: 5 (ref 5.0–8.0)

## 2021-07-22 LAB — RAPID URINE DRUG SCREEN, HOSP PERFORMED
Amphetamines: NOT DETECTED
Barbiturates: NOT DETECTED
Benzodiazepines: POSITIVE — AB
Cocaine: NOT DETECTED
Opiates: NOT DETECTED
Tetrahydrocannabinol: NOT DETECTED

## 2021-07-22 LAB — CBC
HCT: 34.6 % — ABNORMAL LOW (ref 39.0–52.0)
Hemoglobin: 11.2 g/dL — ABNORMAL LOW (ref 13.0–17.0)
MCH: 31.8 pg (ref 26.0–34.0)
MCHC: 32.4 g/dL (ref 30.0–36.0)
MCV: 98.3 fL (ref 80.0–100.0)
Platelets: 265 10*3/uL (ref 150–400)
RBC: 3.52 MIL/uL — ABNORMAL LOW (ref 4.22–5.81)
RDW: 13.4 % (ref 11.5–15.5)
WBC: 7.3 10*3/uL (ref 4.0–10.5)
nRBC: 0 % (ref 0.0–0.2)

## 2021-07-22 LAB — ETHANOL: Alcohol, Ethyl (B): <10 mg/dL (ref <10)

## 2021-07-22 LAB — PROTIME-INR
INR: 1.1 (ref 0.8–1.2)
Prothrombin Time: 14.1 seconds (ref 11.4–15.2)

## 2021-07-22 LAB — CBG MONITORING, ED: Glucose-Capillary: 99 mg/dL (ref 70–99)

## 2021-07-22 LAB — RESP PANEL BY RT-PCR (FLU A&B, COVID) ARPGX2
Influenza A by PCR: NEGATIVE
Influenza B by PCR: NEGATIVE
SARS Coronavirus 2 by RT PCR: NEGATIVE

## 2021-07-22 LAB — APTT: aPTT: 25 seconds (ref 24–36)

## 2021-07-22 MED ORDER — CEFDINIR 300 MG PO CAPS: 300.0000 mg | ORAL_CAPSULE | Freq: Two times a day (BID) | ORAL | 0 refills | Status: DC

## 2021-07-22 MED ORDER — LORAZEPAM 2 MG/ML IJ SOLN: 1.0000 mg | Freq: Once | INTRAMUSCULAR | Status: DC | PRN

## 2021-07-22 NOTE — ED Triage Notes (Signed)
Pt presents to the ED from home via [facility] with complaints of increased confusion and abnormal gait per wife. Last known normal at 2200 last night. Pt denies any complaints. Pt a/ox4, able to answer all questions. Pt has old left sided weakness from CVA 2006. Hx of dementia.

## 2021-07-22 NOTE — ED Provider Notes (Signed)
[facility] EMERGENCY DEPARTMENT Provider Note   CSN: [id number] Arrival date & time: 07/22/21  3212     History  Chief Complaint  Patient presents with   Altered Mental Status    William [name] is a 81 y.o. male.  The history is provided by the patient, the spouse and medical records. No language interpreter was used.  Altered Mental Status Presenting symptoms: confusion and partial responsiveness   Severity:  Severe Most recent episode:  Today Episode history:  Single Progression:  Improving Chronicity:  New Associated symptoms: weakness   Associated symptoms: no abdominal pain, no agitation, no bladder incontinence, no decreased appetite, no depression, no fever, no headaches, no light-headedness, no nausea, no palpitations, no rash, no seizures, no slurred speech, no visual change and no vomiting       Home Medications Prior to Admission medications   Medication Sig Start Date End Date Taking? Authorizing Provider  acetaminophen (TYLENOL) 500 MG tablet Take 500 mg by mouth every 6 (six) hours as needed (1 tab as needed).    [provider]  amLODipine (NORVASC) 10 MG tablet Take 1 tablet by mouth daily 06/17/21     aspirin 325 MG EC tablet Take 325 mg by mouth daily.    [provider]  carbamazepine (TEGRETOL XR) 100 MG 12 hr tablet TAKE 1 TABLET BY MOUTH EVERY MORNING AND 2 AT BEDTIME 04/29/21 04/29/22  Plovsky, Berneta Sages, MD  cephALEXin (KEFLEX) 500 MG capsule Take 1 capsule (500 mg total) by mouth 2 (two) times daily. 06/23/21   Franchot Gallo, MD  Cholecalciferol (VITAMIN D) 2000 UNITS CAPS Take by mouth.    [provider]  COVID-19 mRNA bivalent vaccine, Pfizer, injection Inject into the muscle. 03/12/21   Carlyle Basques, MD  COVID-19 mRNA Vac-TriS, Pfizer, (PFIZER-BIONT COVID-19 VAC-TRIS) SUSP injection Inject into the muscle. 09/23/20   Carlyle Basques, MD  docusate sodium (COLACE) 100 MG capsule Take 100 mg by mouth  daily.    [provider]  Eszopiclone 3 MG TABS TAKE 1 TABLET BY MOUTH IMMEDIATELY BEFORE BEDTIME AS NEEDED 04/29/21 10/26/21  Plovsky, Berneta Sages, MD  famotidine (PEPCID) 20 MG tablet Take 20 mg by mouth 2 (two) times daily.    [provider]  fenofibrate 160 MG tablet Take 1 tablet (160 mg total) by mouth daily. 03/12/21   Lowne Chase, Kendrick Fries R, DO  Flaxseed, Linseed, (FLAX SEED OIL PO) Take by mouth daily.    [provider]  fluticasone (CUTIVATE) 0.05 % cream  12/16/16   [provider]  influenza vaccine adjuvanted (FLUAD) 0.5 ML injection Inject into the muscle. 03/12/21   Carlyle Basques, MD  lisinopril-hydrochlorothiazide (ZESTORETIC) 10-12.5 MG tablet Take 1 tablet by mouth daily 04/02/21     LORazepam (ATIVAN) 0.5 MG tablet Take 2 tablets by mouth every mornig and 1 tablet at bedtime. 04/29/21   Plovsky, Berneta Sages, MD  Multiple Vitamin (MULTIVITAMIN) tablet Take 1 tablet by mouth daily.    [provider]  NONFORMULARY OR COMPOUNDED ITEM [facility]:  Antifungal topical - Terbinafine 3%, Fluconazole 2%, Tea Tree Oil 5%, Urea 10/%,, Ibuprofen 2% in DMSO Suspension #77ml. Apply to the affected nail(s) once (at bedtime) or twice daily. 10/09/18   Trula Slade, DPM  ondansetron (ZOFRAN ODT) 4 MG disintegrating tablet Take 1 tablet (4 mg total) by mouth every 8 (eight) hours as needed for nausea or vomiting. 09/11/19   Carollee Herter, Alferd Apa, DO  pantoprazole (PROTONIX) 40 MG tablet TAKE  1 TABLET (40 MG TOTAL) BY MOUTH 2 (TWO) TIMES DAILY BEFORE A MEAL. 08/25/20 08/25/21  Vevelyn Francois, NP  sildenafil (VIAGRA) 100 MG tablet Take 1 tablet (100 mg total) by mouth as needed up to once daily. 10/22/20   Franchot Gallo, MD  tamsulosin (FLOMAX) 0.4 MG CAPS capsule Take 1 capsule (0.4 mg total) by mouth 2 (two) times daily. 07/21/21     traMADol (ULTRAM) 50 MG tablet Take 1 - 2 tablets by mouth every 6 hours as needed for pain 01/13/21   Carollee Herter, Alferd Apa, DO  traMADol (ULTRAM) 50 MG tablet Take 1 tablet by mouth 3 times a day as needed. 04/29/21     triamcinolone cream (KENALOG) 0.1 % Apply 1 application topically 2 (two) times daily. 12/31/20   Ann Held, DO      Allergies    Strawberry extract, Latex, Adhesive [tape], and Statins    Review of Systems   Review of Systems  Constitutional:  Negative for chills, decreased appetite, diaphoresis, fatigue and fever.  HENT:  Negative for congestion.   Eyes:  Negative for visual disturbance.  Respiratory:  Negative for cough, chest tightness, shortness of breath, wheezing and stridor.   Cardiovascular:  Negative for chest pain and palpitations.  Gastrointestinal:  Negative for abdominal pain, constipation, diarrhea, nausea and vomiting.  Genitourinary:  Positive for frequency. Negative for bladder incontinence and dysuria.  Musculoskeletal:  Negative for back pain, neck pain and neck stiffness.  Skin:  Negative for rash and wound.  Neurological:  Positive for facial asymmetry and weakness. Negative for dizziness, seizures, light-headedness, numbness and headaches.  Psychiatric/Behavioral:  Positive for confusion. Negative for agitation.   All other systems reviewed and are negative.  Physical Exam Updated Vital Signs BP 113/80 (BP Location: Left Arm)    Pulse 64    Temp 98.1 F (36.7 C)    Resp 18    Ht 5\' 11"  (1.803 m)    Wt 96.6 kg    SpO2 98%    BMI 29.71 kg/m  Physical Exam Vitals and nursing note reviewed.  Constitutional:      General: He is not in acute distress.    Appearance: He is well-developed. He is not ill-appearing, toxic-appearing or diaphoretic.  HENT:     Head: Normocephalic and atraumatic.  Eyes:     Conjunctiva/sclera: Conjunctivae normal.  Cardiovascular:     Rate and Rhythm: Normal rate and regular rhythm.     Heart sounds: No murmur heard. Pulmonary:     Effort: Pulmonary effort is normal. No respiratory distress.     Breath sounds: Normal breath  sounds. No wheezing, rhonchi or rales.  Chest:     Chest wall: No tenderness.  Abdominal:     General: Abdomen is flat.     Palpations: Abdomen is soft.     Tenderness: There is no abdominal tenderness.  Musculoskeletal:        General: No swelling or tenderness.     Cervical back: Neck supple. No tenderness.     Right lower leg: No edema.     Left lower leg: No edema.  Skin:    General: Skin is warm and dry.     Capillary Refill: Capillary refill takes less than 2 seconds.  Neurological:     Mental Status: He is alert and oriented to person, place, and time.     Sensory: No sensory deficit.     Motor: Weakness present.  Psychiatric:  Mood and Affect: Mood normal.    ED Results / Procedures / Treatments   Labs (all labs ordered are listed, but only abnormal results are displayed) Labs Reviewed  CBC - Abnormal; Notable for the following components:      Result Value   RBC 3.52 (*)    Hemoglobin 11.2 (*)    HCT 34.6 (*)    All other components within normal limits  COMPREHENSIVE METABOLIC PANEL - Abnormal; Notable for the following components:   Glucose, Bld 110 (*)    BUN 31 (*)    Creatinine, Ser 2.28 (*)    ALT 58 (*)    GFR, Estimated 29 (*)    All other components within normal limits  RAPID URINE DRUG SCREEN, HOSP PERFORMED - Abnormal; Notable for the following components:   Benzodiazepines POSITIVE (*)    All other components within normal limits  URINALYSIS, ROUTINE W REFLEX MICROSCOPIC - Abnormal; Notable for the following components:   APPearance HAZY (*)    Hgb urine dipstick MODERATE (*)    Protein, ur 30 (*)    Leukocytes,Ua LARGE (*)    WBC, UA >50 (*)    Bacteria, UA FEW (*)    All other components within normal limits  I-STAT CHEM 8, ED - Abnormal; Notable for the following components:   BUN 31 (*)    Creatinine, Ser 2.40 (*)    Glucose, Bld 108 (*)    Hemoglobin 11.9 (*)    HCT 35.0 (*)    All other components within normal limits  RESP  PANEL BY RT-PCR (FLU A&B, COVID) ARPGX2  URINE CULTURE  ETHANOL  PROTIME-INR  APTT  DIFFERENTIAL  CBG MONITORING, ED    EKG EKG Interpretation  Date/Time:  Wednesday July 22 2021 06:22:14 EST Ventricular Rate:  70 PR Interval:  212 QRS Duration: 68 QT Interval:  366 QTC Calculation: 395 R Axis:   -47 Text Interpretation: Sinus rhythm with 1st degree A-V block Left axis deviation Minimal voltage criteria for LVH, may be normal variant ( R in aVL ) Inferior infarct , age undetermined Anterolateral infarct , age undetermined Abnormal ECG When compared to prior, more artifact. No STEMI Confirmed by Antony Blackbird [id number]) on 07/22/2021 10:41:37 AM  Radiology CT HEAD WO CONTRAST  Result Date: 07/22/2021 CLINICAL DATA:  81 year old male with history of altered mental status. EXAM: CT HEAD WITHOUT CONTRAST TECHNIQUE: Contiguous axial images were obtained from the base of the skull through the vertex without intravenous contrast. RADIATION DOSE REDUCTION: This exam was performed according to the departmental dose-optimization program which includes automated exposure control, adjustment of the mA and/or kV according to patient size and/or use of iterative reconstruction technique. COMPARISON:  Head CT 11/13/2020. FINDINGS: Brain: Mild cerebral atrophy. Again noted is a large area of low attenuation throughout the right cerebral hemisphere, compatible with extensive encephalomalacia related to remote right MCA territory infarct. Well-defined foci of low attenuation also noted in the left external capsule, and in the head of the right caudate nucleus, compatible with old lacunar infarcts. No evidence of acute infarction, hemorrhage, hydrocephalus, extra-axial collection or mass lesion/mass effect. Vascular: No hyperdense vessel or unexpected calcification. Skull: Normal. Negative for fracture or focal lesion. Sinuses/Orbits: No acute finding. Other: None. IMPRESSION: 1. No acute intracranial  abnormalities. 2. 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 3. Mild cerebral atrophy. Electronically Signed   By: Quillian Quince  Entrikin M.D.   On: 07/22/2021 06:58   MR BRAIN WO CONTRAST  Result Date: 07/22/2021 CLINICAL DATA:  Neuro deficit, acute, stroke suspected Patient had worsened left-sided facial droop and altered mental status this morning. Now improving. Rule out acute stroke. EXAM: MRI HEAD WITHOUT CONTRAST TECHNIQUE: Multiplanar, multiecho pulse sequences of the brain and surrounding structures were obtained without intravenous contrast. COMPARISON:  Same day CT head.  MRI head August 14, 2020. FINDINGS: Motion limited study. Brain: Similar large area predominately right MCA territory encephalomalacia. Surrounding gliosis. Similar small remote lacunar infarct in the left caudate. Additional patchy white matter T2/FLAIR hyperintensity, compatible with chronic microvascular disease. No evidence of acute infarct, hydrocephalus, mass lesion, midline shift, extra-axial fluid collection, or or acute hemorrhage. Vascular: Major arterial flow voids are maintained at the skull base. Skull and upper cervical spine: Normal marrow signal. Sinuses/Orbits: Mild paranasal sinus mucosal thickening. Unremarkable orbits. Other: No mastoid effusions. IMPRESSION: 1. No evidence of acute abnormality on this motion limited study. 2. Similar large remote right MCA territory and small left caudate lacunar infarcts. Electronically Signed   By: Margaretha Sheffield M.D.   On: 07/22/2021 13:02    Procedures Procedures    Medications Ordered in ED Medications  LORazepam (ATIVAN) injection 1 mg (has no administration in time range)    ED Course/ Medical Decision Making/ A&P                           Medical Decision Making Amount and/or Complexity of Data Reviewed Labs: ordered. Radiology:  ordered.  Risk Prescription drug management.    KYSHON TOLLIVER is a 81 y.o. 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  According to wife, patient was last normal last night going to bed and this morning she heard him in the bathroom and patient was acting altered, confused, and had worsened left-sided facial droop in his baseline.  He says that he has had some urinary frequency but denies dysuria.  She reports that as he was altered and confused from baseline they presents for evaluation.  Patient has been waiting approximately 5 hours for evaluation.  They report he has done well here recently with no recent fevers, chills congestion, cough, nausea, vomiting, constipation, or diarrhea.  No recent falls or trauma.  Patient is denying any unilateral numbness, weakness above his baseline, and does say that he has been feeling off today".  He denies any vertigo, dizziness, vision changes, or speech abnormalities.    Vital signs upon my evaluation are reassuring.  EKG does not show STEMI.  On exam, patient does have left-sided facial droop, left arm, and left leg weakness compared to right.  Per wife, it is back to his baseline but his face was much more drooped this morning when she saw him confused in the bathroom.  She reports that she never seen him like this before.  Otherwise lungs are clear and chest was nontender.  Abdomen was nontender.  Patient had symmetric finger-nose-finger testing and otherwise was well-appearing now.\He still says he feels slightly confused but is oriented now.  Work-up was started in triage and patient was found to have evidence of urinary tract infection with leukocytes  and bacteria with no nitrites.  Anticipate  treatment for UTI that likely cause some recrudescence of previous stroke however CT head did not show any acute abnormalities.  Had a shared decision-making conversation with family and we agreed to get MRI to look for new stroke but if this is reassuring, anticipate discharge with antibiotics for likely UTI causing symptoms.  They agree with plan of care, anticipate reassessment after MRI.   MRI shows no stroke.  We will treat for UTI likely causing recrudescence of previous drug symptoms.  Patient feels like he is back to baseline.  Patient found agree with plan of care with antibiotics and PCP follow.  I spoke with pharmacy who recommended antibiotic regimen for him.  Patient will follow-up as previously directed.  They understood return precautions and patient discharged in good condition.        Final Clinical Impression(s) / ED Diagnoses Final diagnoses:  Altered awareness, transient  Acute cystitis without hematuria    Rx / DC Orders ED Discharge Orders          Ordered    cefdinir (OMNICEF) 300 MG capsule  2 times daily        07/22/21 1449            Clinical Impression: 1. Altered awareness, transient   2. Acute cystitis without hematuria     Disposition: Discharge  Condition: Good  I have discussed the results, Dx and Tx plan with the pt(& family if present). He/she/they expressed understanding and agree(s) with the plan. Discharge instructions discussed at great length. Strict return precautions discussed and pt &/or family have verbalized understanding of the instructions. No further questions at time of discharge.    Discharge Medication List as of 07/22/2021  2:49 PM     START taking these medications   Details  cefdinir (OMNICEF) 300 MG capsule Take 1 capsule (300 mg total) by mouth 2 (two) times daily for 7 days., Starting Wed 07/22/2021, Until Wed 07/29/2021, Print        Follow Up: Ann Held, DO [address] [name] [address] [phone]     [facility] EMERGENCY DEPARTMENT [address] [id number] mc [place]        Johnnay Pleitez, Gwenyth Allegra, MD 07/22/21 [phone]

## 2021-07-22 NOTE — ED Provider Triage Note (Signed)
Emergency Medicine Provider Triage Evaluation Note  William [name] , a 81 y.o. male  was evaluated in triage.  Pt complains of AMS.  Has hx of demential.  EMS says that wife said patient wasn't acting right this morning.  Patient denies any complaints.  Hx of prior stroke.  Denies any pain.  Last normal last night at 10pm.  Ambulates with EMS.  Review of Systems  Positive: Confusion (resolved) Negative: Fever, chills  Physical Exam  BP (!) 157/60 (BP Location: Left Arm)    Pulse 67    Temp 98.1 F (36.7 C) (Oral)    Resp 16    SpO2 100%  Gen:   Awake, no distress   Resp:  Normal effort  MSK:   Moves extremities without difficulty  Other:  CN 3-12 grossly intact, responds to questions appropriately  Medical Decision Making  Medically screening exam initiated at 6:20 AM.  Appropriate orders placed.  William [name] was informed that the remainder of the evaluation will be completed by another provider, this initial triage assessment does not replace that evaluation, and the importance of remaining in the ED until their evaluation is complete.  AMS   Baily, Serpe, PA-C 07/22/21 9935

## 2021-07-22 NOTE — Discharge Instructions (Signed)
Your work-up today did not show evidence of new stroke but did show evidence of urinary tract infection.  I suspect this caused some of your symptoms of transient confusion and something called recrudescence of previous stroke.  As you are nearly back to your baseline and we did find a urinalysis showing UTI, we feel you are safe for discharge home.  Please take the antibiotics for the next week.  I spoke with pharmacy to determine the best antibiotic course for you.  Please follow-up with your primary team.  If any symptoms change or worsen, please return to the nearest emergency department.

## 2021-07-22 NOTE — ED Notes (Signed)
Pt's brief saturated when attempting to help him use the urinal. Pt assisted to stand up, brief changed and new brief placed on pt, disposable scrub pants put on, and pt taken to xray. Wife waiting for him in lobby and he will go to the lobby when he returns. Pt had on no pants when arriving. No acute changes noted. Will continue to monitor.

## 2021-07-22 NOTE — ED Notes (Signed)
Patient transported to MRI 

## 2021-07-23 ENCOUNTER — Other Ambulatory Visit: Payer: Self-pay

## 2021-07-23 ENCOUNTER — Telehealth: Payer: Self-pay | Admitting: Neurology

## 2021-07-23 ENCOUNTER — Encounter (HOSPITAL_BASED_OUTPATIENT_CLINIC_OR_DEPARTMENT_OTHER): Payer: Self-pay

## 2021-07-23 ENCOUNTER — Emergency Department (HOSPITAL_BASED_OUTPATIENT_CLINIC_OR_DEPARTMENT_OTHER)
Admission: EM | Admit: 2021-07-23 | Discharge: 2021-07-23 | Disposition: A | Discharge status: Home / Self Care | Payer: PPO | Attending: Emergency Medicine | Admitting: Emergency Medicine

## 2021-07-23 DIAGNOSIS — R29810 Facial weakness: Secondary | ICD-10-CM | POA: Insufficient documentation

## 2021-07-23 DIAGNOSIS — Z91040 Latex allergy status: Secondary | ICD-10-CM | POA: Diagnosis not present

## 2021-07-23 DIAGNOSIS — R410 Disorientation, unspecified: Secondary | ICD-10-CM | POA: Insufficient documentation

## 2021-07-23 DIAGNOSIS — R001 Bradycardia, unspecified: Secondary | ICD-10-CM | POA: Diagnosis not present

## 2021-07-23 DIAGNOSIS — Z79899 Other long term (current) drug therapy: Secondary | ICD-10-CM | POA: Diagnosis not present

## 2021-07-23 DIAGNOSIS — N3000 Acute cystitis without hematuria: Secondary | ICD-10-CM | POA: Diagnosis not present

## 2021-07-23 DIAGNOSIS — Z7982 Long term (current) use of aspirin: Secondary | ICD-10-CM | POA: Insufficient documentation

## 2021-07-23 DIAGNOSIS — R4182 Altered mental status, unspecified: Secondary | ICD-10-CM | POA: Diagnosis present

## 2021-07-23 LAB — URINE CULTURE: Culture: NO GROWTH

## 2021-07-23 LAB — CBG MONITORING, ED: Glucose-Capillary: 155 mg/dL — ABNORMAL HIGH (ref 70–99)

## 2021-07-23 NOTE — ED Notes (Signed)
ED Provider at bedside. 

## 2021-07-23 NOTE — Discharge Instructions (Addendum)
Continue the antibiotic for the urinary tract infection.  You will be notified if the urine culture shows that that antibiotic is not covering the urinary tract infection.  It is reassuring that he had the MRI of the brain yesterday.  Highly unlikely that following that MRI that he has developed new stroke.  Based on his neuro exam seems to be very close to baseline.

## 2021-07-23 NOTE — Telephone Encounter (Signed)
LMOVM for pt wife, Reviewed ED note.  MRI of brain is negative for new stroke.  Confusion likely related to UTI.  People with cognitive impairment easily may become more confused when they have an underlying infection.  Move patient up for follow up but if she is concerned that he is more slurred and unresponsive/confused, he should be re-evaluated in the ED in case repeat imaging or other emergent testing is required.

## 2021-07-23 NOTE — ED Provider Notes (Signed)
[facility] Provider Note   CSN: [id number] Arrival date & time: 07/23/21  1055     History  Chief Complaint  Patient presents with   Altered Mental Status    William [name] is a 81 y.o. male.  Patient seen at [facility] emergency department yesterday with extensive lab work-up head CT MRI brain.  Patient seemed to improve throughout his stay there and was back to baseline when he went home.  But in the morning was very confused and having difficulty walking.  This morning also confused and seemed like he was having some difficulty with speaking.  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  Determined to have carotid issues.  And underwent bilateral carotid endarterectomy.  But definitely as of yesterday morning there was a significant change.  Head CT showed the old stroke MRI brain showed nothing acute.  Patient's urinalysis highly suggestive of urinary tract infection.  He was started on antibiotics for that.  Cultures are pending.  Their clinical impression was no evidence of any acute infarct.  And that perhaps the altered mental status changes were secondary to urinary tract infection.  Patient does have some left-sided facial droop.  Patient's wife states that that is always been there.  She just was concerned that again he woke this morning with a lot of confusion and seemed to have some slurred speech.  Did not really have the walking problems this time.  Patient still confused.  But speech not terribly slurred.  Patient will follow all commands fine.  Blood sugar when they checked him and was 155.  The current findings a little bit different than yesterday.  But seem to fall in the realm of things similar.  Patient is followed by Dr. Tomi Likens from [facility] neurology.      Home Medications Prior to Admission medications   Medication Sig Start Date End Date Taking? Authorizing Provider  acetaminophen (TYLENOL) 500 MG tablet  Take 500 mg by mouth every 6 (six) hours as needed (1 tab as needed).    [provider]  amLODipine (NORVASC) 10 MG tablet Take 1 tablet by mouth daily 06/17/21     aspirin 325 MG EC tablet Take 325 mg by mouth daily.    [provider]  carbamazepine (TEGRETOL XR) 100 MG 12 hr tablet TAKE 1 TABLET BY MOUTH EVERY MORNING AND 2 AT BEDTIME Patient taking differently: 100-200 mg See admin instructions. Taking 100 mg in the Am and 200 mg at bedtime 04/29/21 04/29/22  Plovsky, Berneta Sages, MD  cefdinir (OMNICEF) 300 MG capsule Take 1 capsule (300 mg total) by mouth 2 (two) times daily for 7 days. 07/22/21 07/29/21  Tegeler, Gwenyth Allegra, MD  Cholecalciferol (VITAMIN D) 50 MCG (2000 UT) CAPS Take 2,000 Units by mouth daily.    [provider]  COVID-19 mRNA bivalent vaccine, Pfizer, injection Inject into the muscle. 03/12/21   Carlyle Basques, MD  COVID-19 mRNA Vac-TriS, Pfizer, (PFIZER-BIONT COVID-19 VAC-TRIS) SUSP injection Inject into the muscle. 09/23/20   Carlyle Basques, MD  Eszopiclone 3 MG TABS TAKE 1 TABLET BY MOUTH IMMEDIATELY BEFORE BEDTIME AS NEEDED Patient taking differently: 3 mg at bedtime. 04/29/21 10/26/21  Plovsky, Berneta Sages, MD  famotidine (PEPCID) 20 MG tablet Take 20 mg by mouth 2 (two) times daily as needed for heartburn or indigestion.    [provider]  fenofibrate 160 MG tablet Take 1 tablet (160 mg total) by mouth daily. 03/12/21  Lowne Chase, Yvonne R, DO  Flaxseed, Linseed, (FLAX SEED OIL PO) Take 1,000 mg by mouth daily.    [provider]  fluticasone (CUTIVATE) 3.81 % cream 1 application daily as needed (rash). 12/16/16   [provider]  influenza vaccine adjuvanted (FLUAD) 0.5 ML injection Inject into the muscle. 03/12/21   Carlyle Basques, MD  lisinopril-hydrochlorothiazide (ZESTORETIC) 10-12.5 MG tablet Take 1 tablet by mouth daily 04/02/21     LORazepam (ATIVAN) 0.5 MG tablet Take 2 tablets by mouth every mornig and 1 tablet at  bedtime. Patient taking differently: Take 0.5-1 mg by mouth See admin instructions. Taking 1 mg in the AM and 0.5 mg at bedtime. 04/29/21   Plovsky, Berneta Sages, MD  Multiple Vitamin (MULTIVITAMIN) tablet Take 1 tablet by mouth daily.    [provider]  ondansetron (ZOFRAN ODT) 4 MG disintegrating tablet Take 1 tablet (4 mg total) by mouth every 8 (eight) hours as needed for nausea or vomiting. 09/11/19   Carollee Herter, Alferd Apa, DO  pantoprazole (PROTONIX) 40 MG tablet TAKE 1 TABLET (40 MG TOTAL) BY MOUTH 2 (TWO) TIMES DAILY BEFORE A MEAL. Patient not taking: Reported on 07/22/2021 08/25/20 08/25/21  Vevelyn Francois, NP  sildenafil (VIAGRA) 100 MG tablet Take 1 tablet (100 mg total) by mouth as needed up to once daily. Patient not taking: Reported on 07/22/2021 10/22/20   Franchot Gallo, MD  tamsulosin (FLOMAX) 0.4 MG CAPS capsule Take 1 capsule (0.4 mg total) by mouth 2 (two) times daily. Patient taking differently: Take 0.4 mg by mouth daily after supper. 07/21/21     traMADol (ULTRAM) 50 MG tablet Take 1 tablet by mouth 3 times a day as needed. Patient taking differently: Take 50 mg by mouth every 8 (eight) hours as needed for moderate pain. 04/29/21     triamcinolone cream (KENALOG) 0.1 % Apply 1 application topically 2 (two) times daily. Patient taking differently: Apply 1 application topically 2 (two) times daily as needed (rash, itching). 12/31/20   Ann Held, DO      Allergies    Strawberry extract, Latex, Adhesive [tape], and Statins    Review of Systems   Review of Systems  Constitutional:  Negative for chills and fever.  HENT:  Negative for ear pain and sore throat.   Eyes:  Negative for pain and visual disturbance.  Respiratory:  Negative for cough and shortness of breath.   Cardiovascular:  Negative for chest pain and palpitations.  Gastrointestinal:  Negative for abdominal pain and vomiting.  Genitourinary:  Negative for dysuria and hematuria.  Musculoskeletal:   Negative for arthralgias and back pain.  Skin:  Negative for color change and rash.  Neurological:  Positive for facial asymmetry, speech difficulty and weakness. Negative for seizures and syncope.  All other systems reviewed and are negative.  Physical Exam Updated Vital Signs BP (!) 148/74    Pulse (!) 55    Temp 98 F (36.7 C) (Oral)    Resp 17    Ht 1.803 m (5\' 11" )    Wt 96.6 kg    SpO2 98%    BMI 29.71 kg/m  Physical Exam Vitals and nursing note reviewed.  Constitutional:      General: He is not in acute distress.    Appearance: Normal appearance. He is well-developed.  HENT:     Head: Normocephalic and atraumatic.  Eyes:     Extraocular Movements: Extraocular movements intact.     Conjunctiva/sclera: Conjunctivae normal.  Pupils: Pupils are equal, round, and reactive to light.  Cardiovascular:     Rate and Rhythm: Normal rate and regular rhythm.     Heart sounds: No murmur heard. Pulmonary:     Effort: Pulmonary effort is normal. No respiratory distress.     Breath sounds: Normal breath sounds.  Abdominal:     Palpations: Abdomen is soft.     Tenderness: There is no abdominal tenderness.  Musculoskeletal:        General: No swelling.     Cervical back: Normal range of motion and neck supple.  Skin:    General: Skin is warm and dry.     Capillary Refill: Capillary refill takes less than 2 seconds.  Neurological:     Mental Status: He is alert.     Cranial Nerves: Cranial nerve deficit present.     Sensory: No sensory deficit.     Motor: No weakness.     Comments: Patient without any gross focal deficit.  Bilateral lower extremity strength is little bit weak but equally weak.  Excellent upper extremity strength.  Little bit of left facial droop.  Did check vision as well.  Covered each eye.  And he could correctly identify 1 or 2 fingers.  Speech here seems not to be slurred.  But definitely has a degree of confusion.  Psychiatric:        Mood and Affect: Mood  normal.    ED Results / Procedures / Treatments   Labs (all labs ordered are listed, but only abnormal results are displayed) Labs Reviewed  CBG MONITORING, ED - Abnormal; Notable for the following components:      Result Value   Glucose-Capillary 155 (*)    All other components within normal limits    EKG EKG Interpretation  Date/Time:  Thursday July 23 2021 11:19:32 EST Ventricular Rate:  53 PR Interval:  180 QRS Duration: 112 QT Interval:  424 QTC Calculation: 398 R Axis:   -38 Text Interpretation: Sinus rhythm Borderline IVCD with LAD Abnormal R-wave progression, early transition Sinus bradycardia otherwise no significant change from previous EKG. Confirmed by Fredia Sorrow [phone]) on 07/23/2021 11:22:02 AM  Radiology CT HEAD WO CONTRAST  Result Date: 07/22/2021 CLINICAL DATA:  81 year old male with history of altered mental status. EXAM: CT HEAD WITHOUT CONTRAST TECHNIQUE: Contiguous axial images were obtained from the base of the skull through the vertex without intravenous contrast. RADIATION DOSE REDUCTION: This exam was performed according to the departmental dose-optimization program which includes automated exposure control, adjustment of the mA and/or kV according to patient size and/or use of iterative reconstruction technique. COMPARISON:  Head CT 11/13/2020. FINDINGS: Brain: Mild cerebral atrophy. Again noted is a large area of low attenuation throughout the right cerebral hemisphere, compatible with extensive encephalomalacia related to remote right MCA territory infarct. Well-defined foci of low attenuation also noted in the left external capsule, and in the head of the right caudate nucleus, compatible with old lacunar infarcts. No evidence of acute infarction, hemorrhage, hydrocephalus, extra-axial collection or mass lesion/mass effect. Vascular: No hyperdense vessel or unexpected calcification. Skull: Normal. Negative for fracture or focal lesion. Sinuses/Orbits:  No acute finding. Other: None. IMPRESSION: 1. No acute intracranial abnormalities. 2. 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 3. Mild cerebral atrophy. Electronically Signed   By: Vinnie Langton M.D.   On: 07/22/2021 06:58   MR BRAIN WO  CONTRAST  Result Date: 07/22/2021 CLINICAL DATA:  Neuro deficit, acute, stroke suspected Patient had worsened left-sided facial droop and altered mental status this morning. Now improving. Rule out acute stroke. EXAM: MRI HEAD WITHOUT CONTRAST TECHNIQUE: Multiplanar, multiecho pulse sequences of the brain and surrounding structures were obtained without intravenous contrast. COMPARISON:  Same day CT head.  MRI head August 14, 2020. FINDINGS: Motion limited study. Brain: Similar large area predominately right MCA territory encephalomalacia. Surrounding gliosis. Similar small remote lacunar infarct in the left caudate. Additional patchy white matter T2/FLAIR hyperintensity, compatible with chronic microvascular disease. No evidence of acute infarct, hydrocephalus, mass lesion, midline shift, extra-axial fluid collection, or or acute hemorrhage. Vascular: Major arterial flow voids are maintained at the skull base. Skull and upper cervical spine: Normal marrow signal. Sinuses/Orbits: Mild paranasal sinus mucosal thickening. Unremarkable orbits. Other: No mastoid effusions. IMPRESSION: 1. No evidence of acute abnormality on this motion limited study. 2. Similar large remote right MCA territory and small left caudate lacunar infarcts. Electronically Signed   By: Margaretha Sheffield M.D.   On: 07/22/2021 13:02    Procedures Procedures    Medications Ordered in ED Medications - No data to display  ED Course/ Medical Decision Making/ A&P                           Medical Decision Making  Long discussion with patient's wife.  Feel that symptoms could be secondary to UTI.   He is on the antibiotic.  But it is unlikely that he would have had a new stroke and a lot of the symptomatology overlaps with what was going on yesterday morning.  Do not feel patient needs to be sent back in for another MRI.  Just had one yesterday which was very reassuring.  Blood sugars fine here.  All labs yesterday were also reassuring.  Patient here is able to follow all commands fine.  Speech although wife thinks is a little off seems relatively close to his normal speech still has the left-sided facial droop which is old.  Patient definitely has a degree of confusion.  Feel that all this could be secondary to UTI.  Do not feel patient is to be transferred to [facility] to have repeat MRI.  No indication for additional labs here today.  Patient's vital signs are very reassuring.  Blood pressure up just a little bit with systolic at 233.  That is better than being low.  Cardiac monitor shows sinus type rhythm.  No arrhythmias.  Oxygen saturations are very good her percent on room air.  Not tachypneic.   Final Clinical Impression(s) / ED Diagnoses Final diagnoses:  Confusion  Acute cystitis without hematuria    Rx / DC Orders ED Discharge Orders     None         Fredia Sorrow, MD 07/23/21 1404

## 2021-07-23 NOTE — ED Triage Notes (Signed)
Pt to er with wife, wife states that pt was last normal 10am yesterday.  States that pt had a stroke in 06 with some left sided weakness and L sided facial droop.    Wife states that yesterday morning she found pt in the bathroom not acting right, states that he had a facial droop and pt was acting intoxicated and not making sense, and had a hard time walking.  States that they went to the er and was seen at [facility], states that he was ruled out for a stroke and pt was dx with a UTI.  States that after his evaluation he was acting normal again. States that he woke this am and was confused about what he needed to do and he had some slurred speech.  States his last known well was when he went to bed last night.

## 2021-07-23 NOTE — Telephone Encounter (Signed)
Patient went ED yesterday and was discharged. He still has brain fog today and cannot remember to do much of anything he said.  Today he is doing a little better than yesterday. She'd like to schedule an appointment with Dr. Tomi Likens as soon as possible.  She said he was fine when he left the ED but woke up with brain fog today.

## 2021-07-23 NOTE — Telephone Encounter (Signed)
Per Wife the patient is confused, slurred speech it may be the UTI if the patient is getting worse please go the ED.  Per DR.Tomi Likens he will add him to the schedule ASAP.

## 2021-07-27 ENCOUNTER — Inpatient Hospital Stay (HOSPITAL_COMMUNITY): Payer: PPO

## 2021-07-27 ENCOUNTER — Emergency Department (HOSPITAL_COMMUNITY): Payer: PPO

## 2021-07-27 ENCOUNTER — Inpatient Hospital Stay (HOSPITAL_COMMUNITY)
Admission: EM | Admit: 2021-07-27 | Discharge: 2021-07-29 | DRG: 064 | Disposition: A | Discharge status: Rehabilitation Facility | Payer: PPO | Attending: Internal Medicine | Admitting: Internal Medicine

## 2021-07-27 DIAGNOSIS — W19XXXA Unspecified fall, initial encounter: Secondary | ICD-10-CM

## 2021-07-27 DIAGNOSIS — D631 Anemia in chronic kidney disease: Secondary | ICD-10-CM | POA: Diagnosis not present

## 2021-07-27 DIAGNOSIS — I6930 Unspecified sequelae of cerebral infarction: Secondary | ICD-10-CM

## 2021-07-27 DIAGNOSIS — Z85828 Personal history of other malignant neoplasm of skin: Secondary | ICD-10-CM | POA: Diagnosis not present

## 2021-07-27 DIAGNOSIS — I639 Cerebral infarction, unspecified: Secondary | ICD-10-CM

## 2021-07-27 DIAGNOSIS — Z87891 Personal history of nicotine dependence: Secondary | ICD-10-CM | POA: Diagnosis not present

## 2021-07-27 DIAGNOSIS — K760 Fatty (change of) liver, not elsewhere classified: Secondary | ICD-10-CM | POA: Diagnosis not present

## 2021-07-27 DIAGNOSIS — R58 Hemorrhage, not elsewhere classified: Secondary | ICD-10-CM | POA: Diagnosis not present

## 2021-07-27 DIAGNOSIS — Z79899 Other long term (current) drug therapy: Secondary | ICD-10-CM | POA: Diagnosis not present

## 2021-07-27 DIAGNOSIS — Z20822 Contact with and (suspected) exposure to covid-19: Secondary | ICD-10-CM | POA: Diagnosis not present

## 2021-07-27 DIAGNOSIS — Z8673 Personal history of transient ischemic attack (TIA), and cerebral infarction without residual deficits: Secondary | ICD-10-CM | POA: Diagnosis not present

## 2021-07-27 DIAGNOSIS — M549 Dorsalgia, unspecified: Secondary | ICD-10-CM | POA: Diagnosis present

## 2021-07-27 DIAGNOSIS — K8020 Calculus of gallbladder without cholecystitis without obstruction: Secondary | ICD-10-CM | POA: Diagnosis not present

## 2021-07-27 DIAGNOSIS — Z888 Allergy status to other drugs, medicaments and biological substances status: Secondary | ICD-10-CM | POA: Diagnosis not present

## 2021-07-27 DIAGNOSIS — R4020 Unspecified coma: Secondary | ICD-10-CM | POA: Diagnosis not present

## 2021-07-27 DIAGNOSIS — R739 Hyperglycemia, unspecified: Secondary | ICD-10-CM | POA: Diagnosis present

## 2021-07-27 DIAGNOSIS — R1110 Vomiting, unspecified: Secondary | ICD-10-CM | POA: Diagnosis not present

## 2021-07-27 DIAGNOSIS — G4701 Insomnia due to medical condition: Secondary | ICD-10-CM | POA: Diagnosis not present

## 2021-07-27 DIAGNOSIS — I63231 Cerebral infarction due to unspecified occlusion or stenosis of right carotid arteries: Secondary | ICD-10-CM | POA: Diagnosis not present

## 2021-07-27 DIAGNOSIS — I6523 Occlusion and stenosis of bilateral carotid arteries: Secondary | ICD-10-CM | POA: Diagnosis not present

## 2021-07-27 DIAGNOSIS — F419 Anxiety disorder, unspecified: Secondary | ICD-10-CM | POA: Diagnosis not present

## 2021-07-27 DIAGNOSIS — I6389 Other cerebral infarction: Secondary | ICD-10-CM

## 2021-07-27 DIAGNOSIS — R7303 Prediabetes: Secondary | ICD-10-CM | POA: Diagnosis not present

## 2021-07-27 DIAGNOSIS — G9340 Encephalopathy, unspecified: Secondary | ICD-10-CM | POA: Diagnosis not present

## 2021-07-27 DIAGNOSIS — F0394 Unspecified dementia, unspecified severity, with anxiety: Secondary | ICD-10-CM | POA: Diagnosis not present

## 2021-07-27 DIAGNOSIS — K819 Cholecystitis, unspecified: Secondary | ICD-10-CM

## 2021-07-27 DIAGNOSIS — R55 Syncope and collapse: Secondary | ICD-10-CM | POA: Diagnosis not present

## 2021-07-27 DIAGNOSIS — J9811 Atelectasis: Secondary | ICD-10-CM | POA: Diagnosis not present

## 2021-07-27 DIAGNOSIS — Z8551 Personal history of malignant neoplasm of bladder: Secondary | ICD-10-CM

## 2021-07-27 DIAGNOSIS — R1111 Vomiting without nausea: Secondary | ICD-10-CM | POA: Diagnosis not present

## 2021-07-27 DIAGNOSIS — I679 Cerebrovascular disease, unspecified: Secondary | ICD-10-CM | POA: Diagnosis not present

## 2021-07-27 DIAGNOSIS — D649 Anemia, unspecified: Secondary | ICD-10-CM | POA: Diagnosis present

## 2021-07-27 DIAGNOSIS — G9349 Other encephalopathy: Secondary | ICD-10-CM | POA: Diagnosis not present

## 2021-07-27 DIAGNOSIS — I63512 Cerebral infarction due to unspecified occlusion or stenosis of left middle cerebral artery: Secondary | ICD-10-CM | POA: Diagnosis not present

## 2021-07-27 DIAGNOSIS — Z9049 Acquired absence of other specified parts of digestive tract: Secondary | ICD-10-CM | POA: Diagnosis not present

## 2021-07-27 DIAGNOSIS — G9341 Metabolic encephalopathy: Secondary | ICD-10-CM | POA: Diagnosis present

## 2021-07-27 DIAGNOSIS — Z8249 Family history of ischemic heart disease and other diseases of the circulatory system: Secondary | ICD-10-CM | POA: Diagnosis not present

## 2021-07-27 DIAGNOSIS — K8001 Calculus of gallbladder with acute cholecystitis with obstruction: Secondary | ICD-10-CM | POA: Diagnosis not present

## 2021-07-27 DIAGNOSIS — M545 Low back pain, unspecified: Secondary | ICD-10-CM | POA: Diagnosis not present

## 2021-07-27 DIAGNOSIS — G8191 Hemiplegia, unspecified affecting right dominant side: Secondary | ICD-10-CM | POA: Diagnosis not present

## 2021-07-27 DIAGNOSIS — R29810 Facial weakness: Secondary | ICD-10-CM | POA: Diagnosis not present

## 2021-07-27 DIAGNOSIS — K8000 Calculus of gallbladder with acute cholecystitis without obstruction: Secondary | ICD-10-CM | POA: Diagnosis present

## 2021-07-27 DIAGNOSIS — E559 Vitamin D deficiency, unspecified: Secondary | ICD-10-CM | POA: Diagnosis not present

## 2021-07-27 DIAGNOSIS — D6489 Other specified anemias: Secondary | ICD-10-CM | POA: Diagnosis present

## 2021-07-27 DIAGNOSIS — R0902 Hypoxemia: Secondary | ICD-10-CM | POA: Diagnosis present

## 2021-07-27 DIAGNOSIS — F03A18 Unspecified dementia, mild, with other behavioral disturbance: Secondary | ICD-10-CM | POA: Diagnosis not present

## 2021-07-27 DIAGNOSIS — I10 Essential (primary) hypertension: Secondary | ICD-10-CM | POA: Diagnosis present

## 2021-07-27 DIAGNOSIS — E669 Obesity, unspecified: Secondary | ICD-10-CM | POA: Diagnosis not present

## 2021-07-27 DIAGNOSIS — N401 Enlarged prostate with lower urinary tract symptoms: Secondary | ICD-10-CM | POA: Diagnosis not present

## 2021-07-27 DIAGNOSIS — Z91030 Bee allergy status: Secondary | ICD-10-CM | POA: Diagnosis not present

## 2021-07-27 DIAGNOSIS — I6381 Other cerebral infarction due to occlusion or stenosis of small artery: Secondary | ICD-10-CM | POA: Diagnosis not present

## 2021-07-27 DIAGNOSIS — N3001 Acute cystitis with hematuria: Secondary | ICD-10-CM

## 2021-07-27 DIAGNOSIS — R4182 Altered mental status, unspecified: Secondary | ICD-10-CM | POA: Diagnosis present

## 2021-07-27 DIAGNOSIS — G8929 Other chronic pain: Secondary | ICD-10-CM | POA: Diagnosis present

## 2021-07-27 DIAGNOSIS — R945 Abnormal results of liver function studies: Secondary | ICD-10-CM | POA: Diagnosis not present

## 2021-07-27 DIAGNOSIS — R1011 Right upper quadrant pain: Secondary | ICD-10-CM | POA: Diagnosis not present

## 2021-07-27 DIAGNOSIS — Z86008 Personal history of in-situ neoplasm of other site: Secondary | ICD-10-CM

## 2021-07-27 DIAGNOSIS — R112 Nausea with vomiting, unspecified: Secondary | ICD-10-CM | POA: Diagnosis present

## 2021-07-27 DIAGNOSIS — I129 Hypertensive chronic kidney disease with stage 1 through stage 4 chronic kidney disease, or unspecified chronic kidney disease: Secondary | ICD-10-CM | POA: Diagnosis present

## 2021-07-27 DIAGNOSIS — R109 Unspecified abdominal pain: Secondary | ICD-10-CM | POA: Diagnosis not present

## 2021-07-27 DIAGNOSIS — E785 Hyperlipidemia, unspecified: Secondary | ICD-10-CM | POA: Diagnosis not present

## 2021-07-27 DIAGNOSIS — Y92009 Unspecified place in unspecified non-institutional (private) residence as the place of occurrence of the external cause: Secondary | ICD-10-CM

## 2021-07-27 DIAGNOSIS — N390 Urinary tract infection, site not specified: Secondary | ICD-10-CM | POA: Diagnosis present

## 2021-07-27 DIAGNOSIS — R251 Tremor, unspecified: Secondary | ICD-10-CM | POA: Diagnosis not present

## 2021-07-27 DIAGNOSIS — I63511 Cerebral infarction due to unspecified occlusion or stenosis of right middle cerebral artery: Principal | ICD-10-CM | POA: Diagnosis present

## 2021-07-27 DIAGNOSIS — I700 Atherosclerosis of aorta: Secondary | ICD-10-CM | POA: Diagnosis not present

## 2021-07-27 DIAGNOSIS — I6521 Occlusion and stenosis of right carotid artery: Secondary | ICD-10-CM | POA: Diagnosis present

## 2021-07-27 DIAGNOSIS — G4489 Other headache syndrome: Secondary | ICD-10-CM | POA: Diagnosis not present

## 2021-07-27 DIAGNOSIS — Z818 Family history of other mental and behavioral disorders: Secondary | ICD-10-CM | POA: Diagnosis not present

## 2021-07-27 DIAGNOSIS — I951 Orthostatic hypotension: Secondary | ICD-10-CM | POA: Diagnosis not present

## 2021-07-27 DIAGNOSIS — K219 Gastro-esophageal reflux disease without esophagitis: Secondary | ICD-10-CM | POA: Diagnosis present

## 2021-07-27 DIAGNOSIS — Z7902 Long term (current) use of antithrombotics/antiplatelets: Secondary | ICD-10-CM | POA: Diagnosis not present

## 2021-07-27 DIAGNOSIS — Z7982 Long term (current) use of aspirin: Secondary | ICD-10-CM

## 2021-07-27 DIAGNOSIS — G4700 Insomnia, unspecified: Secondary | ICD-10-CM | POA: Diagnosis not present

## 2021-07-27 DIAGNOSIS — I619 Nontraumatic intracerebral hemorrhage, unspecified: Secondary | ICD-10-CM | POA: Diagnosis not present

## 2021-07-27 DIAGNOSIS — Z9079 Acquired absence of other genital organ(s): Secondary | ICD-10-CM | POA: Diagnosis not present

## 2021-07-27 DIAGNOSIS — Z043 Encounter for examination and observation following other accident: Secondary | ICD-10-CM | POA: Diagnosis not present

## 2021-07-27 DIAGNOSIS — Z9102 Food additives allergy status: Secondary | ICD-10-CM | POA: Diagnosis not present

## 2021-07-27 DIAGNOSIS — F03A3 Unspecified dementia, mild, with mood disturbance: Secondary | ICD-10-CM | POA: Diagnosis not present

## 2021-07-27 DIAGNOSIS — I69354 Hemiplegia and hemiparesis following cerebral infarction affecting left non-dominant side: Secondary | ICD-10-CM | POA: Diagnosis not present

## 2021-07-27 DIAGNOSIS — I69318 Other symptoms and signs involving cognitive functions following cerebral infarction: Secondary | ICD-10-CM | POA: Diagnosis not present

## 2021-07-27 DIAGNOSIS — Z8719 Personal history of other diseases of the digestive system: Secondary | ICD-10-CM | POA: Diagnosis not present

## 2021-07-27 DIAGNOSIS — N184 Chronic kidney disease, stage 4 (severe): Secondary | ICD-10-CM | POA: Diagnosis not present

## 2021-07-27 DIAGNOSIS — N400 Enlarged prostate without lower urinary tract symptoms: Secondary | ICD-10-CM | POA: Diagnosis present

## 2021-07-27 DIAGNOSIS — R4702 Dysphasia: Secondary | ICD-10-CM | POA: Diagnosis present

## 2021-07-27 DIAGNOSIS — G214 Vascular parkinsonism: Secondary | ICD-10-CM | POA: Diagnosis not present

## 2021-07-27 DIAGNOSIS — R7401 Elevation of levels of liver transaminase levels: Secondary | ICD-10-CM | POA: Diagnosis present

## 2021-07-27 LAB — URINALYSIS, ROUTINE W REFLEX MICROSCOPIC
Bilirubin Urine: NEGATIVE
Glucose, UA: NEGATIVE mg/dL
Hgb urine dipstick: NEGATIVE
Ketones, ur: 5 mg/dL — AB
Nitrite: NEGATIVE
Protein, ur: 30 mg/dL — AB
Specific Gravity, Urine: 1.017 (ref 1.005–1.030)
pH: 6 (ref 5.0–8.0)

## 2021-07-27 LAB — LIPID PANEL
Cholesterol: 209 mg/dL — ABNORMAL HIGH (ref 0–200)
HDL: 69 mg/dL (ref >40)
LDL Cholesterol: 125 mg/dL — ABNORMAL HIGH (ref 0–99)
Total CHOL/HDL Ratio: 3 RATIO
Triglycerides: 73 mg/dL (ref <150)
VLDL: 15 mg/dL (ref 0–40)

## 2021-07-27 LAB — ECHOCARDIOGRAM COMPLETE
AR max vel: 2.03 cm2
AV Peak grad: 11.8 mmHg
Ao pk vel: 1.72 m/s
Area-P 1/2: 4.44 cm2
Calc EF: 65.2 %
S' Lateral: 2.8 cm
Single Plane A2C EF: 65 %
Single Plane A4C EF: 64.1 %

## 2021-07-27 LAB — LACTIC ACID, PLASMA: Lactic Acid, Venous: 1.8 mmol/L (ref 0.5–1.9)

## 2021-07-27 LAB — COMPREHENSIVE METABOLIC PANEL
ALT: 115 U/L — ABNORMAL HIGH (ref 0–44)
AST: 226 U/L — ABNORMAL HIGH (ref 15–41)
Albumin: 3.3 g/dL — ABNORMAL LOW (ref 3.5–5.0)
Alkaline Phosphatase: 122 U/L (ref 38–126)
Anion gap: 11 (ref 5–15)
BUN: 32 mg/dL — ABNORMAL HIGH (ref 8–23)
CO2: 24 mmol/L (ref 22–32)
Calcium: 9.1 mg/dL (ref 8.9–10.3)
Chloride: 106 mmol/L (ref 98–111)
Creatinine, Ser: 2.47 mg/dL — ABNORMAL HIGH (ref 0.61–1.24)
GFR, Estimated: 26 mL/min — ABNORMAL LOW (ref >60)
Glucose, Bld: 213 mg/dL — ABNORMAL HIGH (ref 70–99)
Potassium: 4.2 mmol/L (ref 3.5–5.1)
Sodium: 141 mmol/L (ref 135–145)
Total Bilirubin: 1.2 mg/dL (ref 0.3–1.2)
Total Protein: 6.9 g/dL (ref 6.5–8.1)

## 2021-07-27 LAB — TYPE AND SCREEN
ABO/RH(D): A POS
Antibody Screen: NEGATIVE

## 2021-07-27 LAB — CBC WITH DIFFERENTIAL/PLATELET
Abs Immature Granulocytes: 0.08 10*3/uL — ABNORMAL HIGH (ref 0.00–0.07)
Basophils Absolute: 0 10*3/uL (ref 0.0–0.1)
Basophils Relative: 0 %
Eosinophils Absolute: 0.1 10*3/uL (ref 0.0–0.5)
Eosinophils Relative: 1 %
HCT: 34.5 % — ABNORMAL LOW (ref 39.0–52.0)
Hemoglobin: 11.5 g/dL — ABNORMAL LOW (ref 13.0–17.0)
Immature Granulocytes: 1 %
Lymphocytes Relative: 11 %
Lymphs Abs: 1.1 10*3/uL (ref 0.7–4.0)
MCH: 32.8 pg (ref 26.0–34.0)
MCHC: 33.3 g/dL (ref 30.0–36.0)
MCV: 98.3 fL (ref 80.0–100.0)
Monocytes Absolute: 0.7 10*3/uL (ref 0.1–1.0)
Monocytes Relative: 7 %
Neutro Abs: 7.9 10*3/uL — ABNORMAL HIGH (ref 1.7–7.7)
Neutrophils Relative %: 80 %
Platelets: 244 10*3/uL (ref 150–400)
RBC: 3.51 MIL/uL — ABNORMAL LOW (ref 4.22–5.81)
RDW: 13.4 % (ref 11.5–15.5)
WBC: 9.8 10*3/uL (ref 4.0–10.5)
nRBC: 0 % (ref 0.0–0.2)

## 2021-07-27 LAB — RESP PANEL BY RT-PCR (FLU A&B, COVID) ARPGX2
Influenza A by PCR: NEGATIVE
Influenza B by PCR: NEGATIVE
SARS Coronavirus 2 by RT PCR: NEGATIVE

## 2021-07-27 LAB — CK: Total CK: 67 U/L (ref 49–397)

## 2021-07-27 LAB — LIPASE, BLOOD: Lipase: 59 U/L — ABNORMAL HIGH (ref 11–51)

## 2021-07-27 LAB — TROPONIN I (HIGH SENSITIVITY)
Troponin I (High Sensitivity): 15 ng/L (ref <18)
Troponin I (High Sensitivity): 18 ng/L — ABNORMAL HIGH (ref <18)

## 2021-07-27 LAB — PROTIME-INR
INR: 1 (ref 0.8–1.2)
Prothrombin Time: 12.7 seconds (ref 11.4–15.2)

## 2021-07-27 LAB — HEMOGLOBIN A1C
Hgb A1c MFr Bld: 5.3 % (ref 4.8–5.6)
Mean Plasma Glucose: 105.41 mg/dL

## 2021-07-27 LAB — HEMOGLOBIN AND HEMATOCRIT, BLOOD
HCT: 32.8 % — ABNORMAL LOW (ref 39.0–52.0)
Hemoglobin: 11 g/dL — ABNORMAL LOW (ref 13.0–17.0)

## 2021-07-27 LAB — TSH: TSH: 0.555 u[IU]/mL (ref 0.350–4.500)

## 2021-07-27 LAB — POC OCCULT BLOOD, ED: Fecal Occult Bld: NEGATIVE

## 2021-07-27 MED ORDER — FLUTICASONE PROPIONATE 0.05 % EX CREA: 1.0000 "application " | TOPICAL_CREAM | Freq: Every day | CUTANEOUS | Status: DC | PRN

## 2021-07-27 MED ORDER — PANTOPRAZOLE SODIUM 40 MG IV SOLR: 40.0000 mg | Freq: Two times a day (BID) | INTRAVENOUS | Status: DC

## 2021-07-27 MED ORDER — ASPIRIN EC 81 MG PO TBEC
81.0000 mg | DELAYED_RELEASE_TABLET | Freq: Every day | ORAL | Status: DC
  Administered 2021-07-27 – 2021-07-29 (×3): 81 mg via ORAL
  Filled 2021-07-27 (×3): qty 1

## 2021-07-27 MED ORDER — LORAZEPAM 0.5 MG PO TABS
0.5000 mg | ORAL_TABLET | Freq: Every day | ORAL | Status: AC
  Administered 2021-07-27: 0.5 mg via ORAL
  Filled 2021-07-27: qty 1

## 2021-07-27 MED ORDER — FENOFIBRATE 160 MG PO TABS
160.0000 mg | ORAL_TABLET | Freq: Every day | ORAL | Status: DC
  Administered 2021-07-27 – 2021-07-28 (×2): 160 mg via ORAL
  Filled 2021-07-27 (×2): qty 1

## 2021-07-27 MED ORDER — HYDRALAZINE HCL 20 MG/ML IJ SOLN: 10.0000 mg | INTRAMUSCULAR | Status: DC | PRN

## 2021-07-27 MED ORDER — ONDANSETRON HCL 4 MG/2ML IJ SOLN
4.0000 mg | Freq: Once | INTRAMUSCULAR | Status: AC
  Administered 2021-07-27: 4 mg via INTRAVENOUS
  Filled 2021-07-27: qty 2

## 2021-07-27 MED ORDER — TECHNETIUM TC 99M MEBROFENIN IV KIT
5.3000 | PACK | Freq: Once | INTRAVENOUS | Status: AC | PRN
  Administered 2021-07-27: 5.3 via INTRAVENOUS

## 2021-07-27 MED ORDER — SENNOSIDES-DOCUSATE SODIUM 8.6-50 MG PO TABS: 1.0000 | ORAL_TABLET | Freq: Every evening | ORAL | Status: DC | PRN

## 2021-07-27 MED ORDER — ZOLPIDEM TARTRATE 5 MG PO TABS
5.0000 mg | ORAL_TABLET | Freq: Every evening | ORAL | Status: DC | PRN
  Administered 2021-07-28: 5 mg via ORAL
  Filled 2021-07-27: qty 1

## 2021-07-27 MED ORDER — PIPERACILLIN-TAZOBACTAM 3.375 G IVPB 30 MIN
3.3750 g | Freq: Once | INTRAVENOUS | Status: AC
  Administered 2021-07-27: 3.375 g via INTRAVENOUS
  Filled 2021-07-27: qty 50

## 2021-07-27 MED ORDER — AMLODIPINE BESYLATE 10 MG PO TABS
10.0000 mg | ORAL_TABLET | Freq: Every morning | ORAL | Status: DC
  Administered 2021-07-27 – 2021-07-29 (×3): 10 mg via ORAL
  Filled 2021-07-27 (×3): qty 1

## 2021-07-27 MED ORDER — HYDROCORTISONE VALERATE 0.2 % EX CREA
TOPICAL_CREAM | Freq: Every day | CUTANEOUS | Status: DC | PRN
  Filled 2021-07-27: qty 15

## 2021-07-27 MED ORDER — PANTOPRAZOLE 80MG IVPB - SIMPLE MED
80.0000 mg | Freq: Once | INTRAVENOUS | Status: AC
  Administered 2021-07-27: 80 mg via INTRAVENOUS
  Filled 2021-07-27: qty 80

## 2021-07-27 MED ORDER — ACETAMINOPHEN 160 MG/5ML PO SOLN: 650.0000 mg | ORAL | Status: DC | PRN

## 2021-07-27 MED ORDER — PANTOPRAZOLE INFUSION (NEW) - SIMPLE MED
8.0000 mg/h | INTRAVENOUS | Status: DC
  Administered 2021-07-27: 8 mg/h via INTRAVENOUS
  Filled 2021-07-27: qty 100
  Filled 2021-07-27: qty 80

## 2021-07-27 MED ORDER — SODIUM CHLORIDE 0.9 % IV SOLN: INTRAVENOUS | Status: AC

## 2021-07-27 MED ORDER — TAMSULOSIN HCL 0.4 MG PO CAPS
0.4000 mg | ORAL_CAPSULE | Freq: Every day | ORAL | Status: DC
  Administered 2021-07-27 – 2021-07-28 (×2): 0.4 mg via ORAL
  Filled 2021-07-27 (×2): qty 1

## 2021-07-27 MED ORDER — LORAZEPAM 2 MG/ML IJ SOLN
1.0000 mg | Freq: Once | INTRAMUSCULAR | Status: AC | PRN
  Administered 2021-07-27: 1 mg via INTRAVENOUS
  Filled 2021-07-27: qty 1

## 2021-07-27 MED ORDER — PERFLUTREN LIPID MICROSPHERE
1.0000 mL | INTRAVENOUS | Status: AC | PRN
  Administered 2021-07-27: 2 mL via INTRAVENOUS
  Filled 2021-07-27: qty 10

## 2021-07-27 MED ORDER — STROKE: EARLY STAGES OF RECOVERY BOOK
Freq: Once | Status: AC
  Administered 2021-07-28: 1
  Filled 2021-07-27: qty 1

## 2021-07-27 MED ORDER — CLOPIDOGREL BISULFATE 75 MG PO TABS
75.0000 mg | ORAL_TABLET | Freq: Every day | ORAL | Status: DC
  Administered 2021-07-27 – 2021-07-29 (×3): 75 mg via ORAL
  Filled 2021-07-27 (×3): qty 1

## 2021-07-27 MED ORDER — ACETAMINOPHEN 650 MG RE SUPP: 650.0000 mg | RECTAL | Status: DC | PRN

## 2021-07-27 MED ORDER — CARBAMAZEPINE ER 100 MG PO TB12: 100.0000 mg | ORAL_TABLET | ORAL | Status: DC

## 2021-07-27 MED ORDER — CARBAMAZEPINE ER 200 MG PO TB12
200.0000 mg | ORAL_TABLET | Freq: Every day | ORAL | Status: DC
  Administered 2021-07-27 – 2021-07-28 (×2): 200 mg via ORAL
  Filled 2021-07-27 (×4): qty 1

## 2021-07-27 MED ORDER — PANTOPRAZOLE SODIUM 40 MG IV SOLR
40.0000 mg | Freq: Two times a day (BID) | INTRAVENOUS | Status: DC
  Administered 2021-07-27 – 2021-07-29 (×4): 40 mg via INTRAVENOUS
  Filled 2021-07-27 (×4): qty 10

## 2021-07-27 MED ORDER — ACETAMINOPHEN 325 MG PO TABS: 650.0000 mg | ORAL_TABLET | ORAL | Status: DC | PRN

## 2021-07-27 MED ORDER — CARBAMAZEPINE ER 100 MG PO TB12
100.0000 mg | ORAL_TABLET | Freq: Every morning | ORAL | Status: DC
  Administered 2021-07-28 – 2021-07-29 (×2): 100 mg via ORAL
  Filled 2021-07-27 (×3): qty 1

## 2021-07-27 NOTE — Assessment & Plan Note (Signed)
-  Continue tramadol as needed for back

## 2021-07-27 NOTE — Consult Note (Signed)
NEURO HOSPITALIST CONSULT NOTE   Requestig physician: Dr. Betsey Holiday  Reason for Consult: New left basal ganglia and corona radiata stroke seen on CT  History obtained from:  Wife and Chart     HPI:                                                                                                                                          William [name] is a 81 y.o. 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   First seen at [facility] on 2/8 for AMS with work up including MRI negative. His AMS worsened with new onset of slurred speech and he was re-evaluated at [facility] on the 9th, with symptoms felt most likely at that time to have been due to a UTI. He represented to the [facility] ED overnight with vomiting productive of coffee ground emesis as well as as syncopal episode. He did not strike his head. Wife endorses new onset of dysphasia starting on Saturday in conjunction with new facial asymmetry relative to his baseline from the old stroke. He also was having difficulty ambulating. 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 He is also fully conversational at baseline "speaking like you and me" per wife.   CT head was obtained, revealing an acute perforator infarct at the left basal ganglia and corona radiata. A large remote right MCA infarction, involving most of that territory, was again seen.   His stroke risk factors also include cancer history (bladder), carotid artery disease (s/p bilateral CEA, one in 2006 and the second "several years later"), CKD3, HTN and HLD.  Home medications include ASA. He is also on carbamazepine for mood control. Wife denies any history of seizure.     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   Mohs  surgery jan 2016    Bladder stone    BPH (benign prostatic hyperplasia) 08/06/2007   Carotid artery occlusion    Chronic kidney disease 2014   Stage III   Closed fracture of fifth metacarpal bone 05/15/2015   Eczema    Fasting hyperglycemia 12/21/2006   GERD (gastroesophageal reflux disease)    History of carotid artery stenosis    S/P BILATERAL CEA   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   HTN (hypertension) 07/19/2015   Hyperlipidemia    Hypertension    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   Mild, related to stroke history   Nocturia    Renal insufficiency 06/25/2013   S/P carotid endarterectomy    BILATERAL  ICA--  PATENT PER DUPLEX  05-19-2012   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   Right calf   Urinary frequency    Vitamin D deficiency     Past Surgical History:  Procedure Laterality Date   APPENDECTOMY  AS CHILD   CARDIOVASCULAR STRESS TEST  03-27-2012  DR CRENSHAW   LOW RISK LEXISCAN STUDY-- PROBABLE NORMAL PERFUSION AND SOFT TISSUE ATTENUATION/  NO ISCHEMIA/ EF 51%   CAROTID ENDARTERECTOMY Bilateral LEFT  11-12-2008  DR GREG HAYES   RIGHT ICA  2006  ([facility])   CYSTOSCOPY W/ RETROGRADES Bilateral 06/22/2021   Procedure: CYSTOSCOPY WITH RETROGRADE PYELOGRAM;  Surgeon: Franchot Gallo, MD;  Location: [facility];  Service: Urology;  Laterality: Bilateral;   CYSTOSCOPY WITH LITHOLAPAXY N/A 02/26/2013   Procedure: CYSTOSCOPY WITH LITHOLAPAXY;  Surgeon: Franchot Gallo, MD;  Location: [facility];  Service: Urology;  Laterality: N/A;   EYE SURGERY  Jan. 2016   cataract surgery both eyes   INGUINAL HERNIA REPAIR Right 11-08-2006   IR KYPHO EA ADDL LEVEL THORACIC OR LUMBAR  02/12/2021   IR RADIOLOGIST EVAL & MGMT  02/18/2021   MASS EXCISION N/A 03/03/2016   Procedure: EXCISION OF BACK  MASS;  Surgeon: Stark Klein, MD;  Location: [place];  Service: General;  Laterality: N/A;    MOHS SURGERY Left 1/ 2016   Dr Nevada Crane-- Basal cell   PROSTATE SURGERY     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-C N/A 06/22/2021   Procedure: TRANSURETHRAL RESECTION OF BLADDER TUMOR;  Surgeon: Franchot Gallo, MD;  Location: [facility];  Service: Urology;  Laterality: N/A;   TRANSURETHRAL RESECTION OF PROSTATE N/A 02/26/2013   Procedure: TRANSURETHRAL RESECTION OF THE PROSTATE WITH GYRUS INSTRUMENTS;  Surgeon: Franchot Gallo, MD;  Location: [facility];  Service: Urology;  Laterality: N/A;   TRANSURETHRAL RESECTION OF PROSTATE N/A 06/22/2021   Procedure: TRANSURETHRAL RESECTION OF THE PROSTATE (TURP);  Surgeon: Franchot Gallo, MD;  Location: [facility];  Service: Urology;  Laterality: N/A;    Family History  Problem Relation Age of Onset   Heart disease Mother        CHF   Bipolar disorder Mother    Heart disease Father        CHF          Social History:  reports that he quit smoking about 16 years ago. His smoking use included cigarettes. He has a 80.00 pack-year smoking history. He has never used smokeless tobacco. He reports current alcohol use. He reports that he does not use drugs.  Allergies  Allergen Reactions   Strawberry Extract Hives   Latex Itching   Adhesive [Tape] Other (See Comments)    BLISTER   Statins Other (See Comments)    myalgias    HOME MEDICATIONS:  Current Facility-Administered Medications on File Prior to Encounter  Medication Dose Route Frequency Provider Last Rate Last Admin   gemcitabine (GEMZAR) chemo syringe for bladder instillation 2,000 mg  2,000 mg Bladder Instillation Once Franchot Gallo, MD       Current Outpatient Medications on File Prior to Encounter  Medication Sig Dispense Refill   acetaminophen (TYLENOL) 500 MG tablet Take 500 mg by mouth every 6  (six) hours as needed (1 tab as needed).     amLODipine (NORVASC) 10 MG tablet Take 1 tablet by mouth daily 90 tablet 3   aspirin 325 MG EC tablet Take 325 mg by mouth daily.     carbamazepine (TEGRETOL XR) 100 MG 12 hr tablet TAKE 1 TABLET BY MOUTH EVERY MORNING AND 2 AT BEDTIME (Patient taking differently: 100-200 mg See admin instructions. Taking 100 mg in the Am and 200 mg at bedtime) 270 tablet 1   cefdinir (OMNICEF) 300 MG capsule Take 1 capsule (300 mg total) by mouth 2 (two) times daily for 7 days. 14 capsule 0   Cholecalciferol (VITAMIN D) 50 MCG (2000 UT) CAPS Take 2,000 Units by mouth daily.     COVID-19 mRNA bivalent vaccine, Pfizer, injection Inject into the muscle. 0.3 mL 0   COVID-19 mRNA Vac-TriS, Pfizer, (PFIZER-BIONT COVID-19 VAC-TRIS) SUSP injection Inject into the muscle. 0.3 mL 1   Eszopiclone 3 MG TABS TAKE 1 TABLET BY MOUTH IMMEDIATELY BEFORE BEDTIME AS NEEDED (Patient taking differently: 3 mg at bedtime.) 90 tablet 2   famotidine (PEPCID) 20 MG tablet Take 20 mg by mouth 2 (two) times daily as needed for heartburn or indigestion.     fenofibrate 160 MG tablet Take 1 tablet (160 mg total) by mouth daily. 90 tablet 1   Flaxseed, Linseed, (FLAX SEED OIL PO) Take 1,000 mg by mouth daily.     fluticasone (CUTIVATE) 7.09 % cream 1 application daily as needed (rash).  3   influenza vaccine adjuvanted (FLUAD) 0.5 ML injection Inject into the muscle. 0.5 mL 0   lisinopril-hydrochlorothiazide (ZESTORETIC) 10-12.5 MG tablet Take 1 tablet by mouth daily 30 tablet 3   LORazepam (ATIVAN) 0.5 MG tablet Take 2 tablets by mouth every mornig and 1 tablet at bedtime. (Patient taking differently: Take 0.5-1 mg by mouth See admin instructions. Taking 1 mg in the AM and 0.5 mg at bedtime.) 90 tablet 5   Multiple Vitamin (MULTIVITAMIN) tablet Take 1 tablet by mouth daily.     ondansetron (ZOFRAN ODT) 4 MG disintegrating tablet Take 1 tablet (4 mg total) by mouth every 8 (eight) hours as needed for  nausea or vomiting. 20 tablet 0   pantoprazole (PROTONIX) 40 MG tablet TAKE 1 TABLET (40 MG TOTAL) BY MOUTH 2 (TWO) TIMES DAILY BEFORE A MEAL. (Patient not taking: Reported on 07/22/2021) 60 tablet 2   sildenafil (VIAGRA) 100 MG tablet Take 1 tablet (100 mg total) by mouth as needed up to once daily. (Patient not taking: Reported on 07/22/2021) 10 tablet 11   tamsulosin (FLOMAX) 0.4 MG CAPS capsule Take 1 capsule (0.4 mg total) by mouth 2 (two) times daily. (Patient taking differently: Take 0.4 mg by mouth daily after supper.) 60 capsule 11   traMADol (ULTRAM) 50 MG tablet Take 1 tablet by mouth 3 times a day as needed. (Patient taking differently: Take 50 mg by mouth every 8 (eight) hours as needed for moderate pain.) 30 tablet 0   triamcinolone cream (KENALOG) 0.1 % Apply 1 application topically 2 (two) times daily. (  Patient taking differently: Apply 1 application topically 2 (two) times daily as needed (rash, itching).) 30 g 0     ROS:                                                                                                                                       Unable to obtain due to perseverative speech and dysphasia.    Blood pressure (!) 153/71, pulse 98, temperature 98.3 F (36.8 C), temperature source Oral, resp. rate (!) 25, SpO2 97 %.   General Examination:                                                                                                       Physical Exam  HEENT-  [place]/AT    Lungs- Shallow respirations Extremities- Warm and well-perfused   Neurological Examination Mental Status: Drowsy to somnolent. Also appears fatigued. Requires frequent stimulation to attend to questions and commands. Speech is severely dysarthric and sparse - converses with mostly with single words and short phrases, but not in full sentences. Able to name 2/3 items. Able to count fingers. Perseverates at times. Able to follow all simple commands. Cognitive slowing with increased latencies of  verbal and motor responses. Oriented to the city but not the state. Oriented to the year but not the day or the month.  Cranial Nerves: II: Intact temporal visual fields bilaterally with no extinction to DSS. PERRL.   III,IV, VI: No ptosis. Eyes preferentially deviated to the left. Will track briefly a few mm past the midline to the right, but quickly gazes back to the left. No nystagmus.  V: Reacts to touch bilaterally VII: Smile asymmetric with left lower quadrant worse than right lower quadrant weakness.  VIII: Hearing intact to voice IX,X: Hypophonic speech XI: Head preferentially rotated to the left XII: Does not protrude tongue to command.  Motor: RUE 4-/5 proximally and distally RLE with weak flexion at hip and knee to request but does not elevate antigravity.  LUE with increased flexor and adductor tone. Lifts antigravity at elbow 3/5, but not at shoulder which is rated as 2/5. Grip is 3/5.  LLE with weak flexion at hip and knee to request but does not elevate antigravity. Movements are less brisk than on the right.  Sensory: Reacts to touch and temp x 4 which are subjectively stated as the same on both sides.  Deep Tendon Reflexes: 3+ right biceps and brachioradialis; 4+ left brachioradialis and unable to elicit biceps due  to flexion contracture. Right patellar 3+, left patellar 3+. Right achilles 0, left achilles 2+.  Plantars: Upgoing bilaterally  Cerebellar: Prominent coarse action tremor right worse than left. Cannot perform FNF on left due to weakness and no definite ataxia on right in the context of the tremor. Intermittent coarse rest tremor of RUE which is triggered by removing blanket and improves or resolves with replacement of blanket.  Gait: Unable to assess.    Lab Results: Basic Metabolic Panel: Recent Labs  Lab 07/22/21 0633 07/22/21 0653 07/27/21 0307  NA 139 141 141  K 4.1 4.2 4.2  CL 105 108 106  CO2 22  --  24  GLUCOSE 110* 108* 213*  BUN 31* 31* 32*   CREATININE 2.28* 2.40* 2.47*  CALCIUM 9.1  --  9.1    CBC: Recent Labs  Lab 07/22/21 0633 07/22/21 0653 07/27/21 0307  WBC 7.3  --  9.8  NEUTROABS 4.1  --  7.9*  HGB 11.2* 11.9* 11.5*  HCT 34.6* 35.0* 34.5*  MCV 98.3  --  98.3  PLT 265  --  244    Cardiac Enzymes: No results for input(s): CKTOTAL, CKMB, CKMBINDEX, TROPONINI in the last 168 hours.  Lipid Panel: No results for input(s): CHOL, TRIG, HDL, CHOLHDL, VLDL, LDLCALC in the last 168 hours.  Imaging: CT HEAD WO CONTRAST (5MM)  Result Date: 07/27/2021 CLINICAL DATA:  Minor head trauma.  Vomiting and syncope EXAM: CT HEAD WITHOUT CONTRAST CT CERVICAL SPINE WITHOUT CONTRAST TECHNIQUE: Multidetector CT imaging of the head and cervical spine was performed following the standard protocol without intravenous contrast. Multiplanar CT image reconstructions of the cervical spine were also generated. RADIATION DOSE REDUCTION: This exam was performed according to the departmental dose-optimization program which includes automated exposure control, adjustment of the mA and/or kV according to patient size and/or use of iterative reconstruction technique. COMPARISON:  Brain MRI from 5 days ago FINDINGS: CT HEAD FINDINGS Brain: Interval and acute appearing infarct at the left stratum and corona radiata. Large remote right MCA territory infarct affecting the upper division. Brain atrophy. No acute hemorrhage or hydrocephalus Vascular: No hyperdense vessel or unexpected calcification. Skull: Normal. Negative for fracture or focal lesion. Sinuses/Orbits: No acute finding. CT CERVICAL SPINE FINDINGS Alignment: No traumatic malalignment Skull base and vertebrae: No acute fracture Soft tissues and spinal canal: No prevertebral fluid or swelling. No visible canal hematoma. Disc levels:  Ordinary degenerative changes. 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 These results were called by telephone at the time of  interpretation on 07/27/2021 at 4:17 am to provider [facility] , who verbally acknowledged these results. IMPRESSION: 1. 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 2. No posttraumatic intracranial or cervical spine finding. 3. Remote right MCA upper division infarcts. Electronically Signed   By: Jorje Guild M.D.   On: 07/27/2021 04:17   CT CERVICAL SPINE WO CONTRAST  Result Date: 07/27/2021 CLINICAL DATA:  Minor head trauma.  Vomiting and syncope EXAM: CT HEAD WITHOUT CONTRAST CT CERVICAL SPINE WITHOUT CONTRAST TECHNIQUE: Multidetector CT imaging of the head and cervical spine was performed following the standard protocol without intravenous contrast. Multiplanar CT image reconstructions of the cervical spine were also generated. RADIATION DOSE REDUCTION: This exam was performed according to the departmental dose-optimization program which includes automated exposure control, adjustment of the mA and/or kV according to patient size and/or use of iterative reconstruction technique. COMPARISON:  Brain MRI from 5 days ago FINDINGS: CT  HEAD FINDINGS Brain: Interval and acute appearing infarct at the left stratum and corona radiata. Large remote right MCA territory infarct affecting the upper division. Brain atrophy. No acute hemorrhage or hydrocephalus Vascular: No hyperdense vessel or unexpected calcification. Skull: Normal. Negative for fracture or focal lesion. Sinuses/Orbits: No acute finding. CT CERVICAL SPINE FINDINGS Alignment: No traumatic malalignment Skull base and vertebrae: No acute fracture Soft tissues and spinal canal: No prevertebral fluid or swelling. No visible canal hematoma. Disc levels:  Ordinary degenerative changes. 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 These results were called by telephone at the time of interpretation on 07/27/2021 at 4:17 am to provider [facility] , who verbally acknowledged these  results. IMPRESSION: 1. 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 2. No posttraumatic intracranial or cervical spine finding. 3. Remote right MCA upper division infarcts. Electronically Signed   By: Jorje Guild M.D.   On: 07/27/2021 04:17   DG Chest Port 1 View  Result Date: 07/27/2021 CLINICAL DATA:  Fall EXAM: PORTABLE CHEST 1 VIEW COMPARISON:  11/13/2020 FINDINGS: Heart and mediastinal contours are within normal limits. No focal opacities or effusions. No acute bony abnormality. No visible rib fracture or pneumothorax. IMPRESSION: No active disease. Electronically Signed   By: Rolm Baptise M.D.   On: 07/27/2021 03:27   US ABDOMEN LIMITED RUQ (LIVER/GB)  Result Date: 07/27/2021 CLINICAL DATA:  Abdominal pain with elevated LFTs. EXAM: ULTRASOUND ABDOMEN LIMITED RIGHT UPPER QUADRANT COMPARISON:  CT without contrast 04/28/2021 FINDINGS: Gallbladder: The gallbladder is filled with stones, largest measurable stone approaching 1.5 cm. There is a thickened anterior free wall measuring 5 mm with a positive sonographic Murphy sign. This is concerning for acute cholecystitis. Common bile duct: Diameter: Unable to visualize. Liver: No focal lesion identified. There is increased hepatic echogenicity consistent with steatosis. Portal vein is patent on color Doppler imaging with normal direction of blood flow towards the liver. Other: 5 cm simple appearing cyst again noted in the upper pole right kidney. No free fluid is evident. IMPRESSION: 1. Mild thickening of the gallbladder with positive sonographic Murphy sign and multiple stones. Findings concerning for acute cholecystitis. No pericholecystic fluid. 2. Fatty liver. 3. Unable to visualize the bile ducts due to shadowing by the gallbladder and due to bowel gas. 4. 5 cm simple cyst right kidney. Electronically Signed   By: Telford Nab M.D.   On: 07/27/2021 06:10     Assessment: 81 y.o. male who re-presents to the hospital after  new onset of vomiting and syncope, following two recent visits for AMS. Last seen at [facility] on 2/8 for AMS with work up including MRI negative. His AMS worsened with new onset of slurred speech and he was re-evaluated at [facility] on the 9th, with symptoms felt most likely at that time to have been due to a UTI. He represented to the [facility] ED overnight with vomiting productive of coffee ground emesis as well as as syncopal episode. He did not strike his head. CT head was obtained, revealing a subacute perforator infarct at the left basal ganglia and corona radiata. A large remote right MCA infarction, involving most of that territory, is again seen.  1. Exam reveals expressive dysphasia, confusion, perseveration and new right sided weakness as well as intermittent coarse upper extremity tremors, the latter occurring consistently when blanket is removed, improving or resolving when blanket is replaced, most consistent with an enhanced shivering response to cold ambient temperature. Exam also with  left hemiparesis, arm worse than leg, that is referable to the chronic large right MCA infarction seen on CT.  2. CT head: Subacute perforator infarct at the left basal ganglia and corona radiata. A large remote right MCA infarction, involving most of that territory, is again seen. 3. Stroke risk factors: Cancer history (bladder), carotid artery disease, prior stroke, CKD, HTN, advanced age and HLD.  Recommendations: 1. HgbA1c, fasting lipid panel 2. MRI/MRA of the brain without contrast 3. PT consult, OT consult, Speech consult 4. Echocardiogram 5. Carotid ultrasound 6. Prophylactic therapy- Add Plavix to ASA. He has failed monotherapy.   7. Risk factor modification 8. Telemetry monitoring 9. Frequent neuro checks 10. NPO until passes stroke swallow screen 11. BP management. Out of the permissive HTN time window.  12. Metabolic/toxic work up for his encephalopathy.  13. Management of probable acute cholecystitis per  primary team.  14. EEG to further evaluate for possible metabolic encephalopathy.  15. IV hydration  Electronically signed: Dr. Kerney Elbe 07/27/2021, 6:18 AM

## 2021-07-27 NOTE — Progress Notes (Signed)
HIDA reviewed and negative for Acute Cholecystitis. No indication for antibiotics or procedure from our standpoint. We will sign off at this time. Please call back with any questions or concerns.   Alferd Apa, [facility] 07/27/2021, 3:17 PM

## 2021-07-27 NOTE — Assessment & Plan Note (Signed)
Acute.  On admission glucose was initially elevated up to 213.  Hemoglobin A1c was able to be obtained and noted to be 5.3.  Suspect elevated glucose secondary to acute stress response.   -Continue to monitor

## 2021-07-27 NOTE — Assessment & Plan Note (Signed)
Patient with a history of bladder cancer and BPH requiring TURP followed in the outpatient setting by urology. -Continue Flomax

## 2021-07-27 NOTE — ED Provider Notes (Signed)
[facility] EMERGENCY DEPARTMENT Provider Note   CSN: [id number] Arrival date & time: 07/27/21  0250     History  Chief Complaint  Patient presents with   GI Bleeding    William [name] is a 81 y.o. male.  Patient presents to the emergency department after a fall.  Wife reports that he fell in the bathroom, was found on the ground.  He is not sure if he lost his balance or slipped on some water.  He had gone to the bathroom because he acutely became nauseated and vomited.  Patient arrives with brown substance on his face, concern for possible GI bleed.  Wife reports that patient has been altered for 5 days.  He was seen in the emergency department when the symptoms began and had a work-up that did not explain his confusion other than UTI which was treated with antibiotics.  Wife reports that he has continued to be altered, slurred speech since then.  Has not improved.      Home Medications Prior to Admission medications   Medication Sig Start Date End Date Taking? Authorizing Provider  acetaminophen (TYLENOL) 500 MG tablet Take 500 mg by mouth every 6 (six) hours as needed (1 tab as needed).    [provider]  amLODipine (NORVASC) 10 MG tablet Take 1 tablet by mouth daily 06/17/21     aspirin 325 MG EC tablet Take 325 mg by mouth daily.    [provider]  carbamazepine (TEGRETOL XR) 100 MG 12 hr tablet TAKE 1 TABLET BY MOUTH EVERY MORNING AND 2 AT BEDTIME Patient taking differently: 100-200 mg See admin instructions. Taking 100 mg in the Am and 200 mg at bedtime 04/29/21 04/29/22  Plovsky, Berneta Sages, MD  cefdinir (OMNICEF) 300 MG capsule Take 1 capsule (300 mg total) by mouth 2 (two) times daily for 7 days. 07/22/21 07/29/21  Tegeler, Gwenyth Allegra, MD  Cholecalciferol (VITAMIN D) 50 MCG (2000 UT) CAPS Take 2,000 Units by mouth daily.    [provider]  COVID-19 mRNA bivalent vaccine, Pfizer, injection Inject into the muscle. 03/12/21   Carlyle Basques, MD  COVID-19 mRNA Vac-TriS, Pfizer, (PFIZER-BIONT COVID-19 VAC-TRIS) SUSP injection Inject into the muscle. 09/23/20   Carlyle Basques, MD  Eszopiclone 3 MG TABS TAKE 1 TABLET BY MOUTH IMMEDIATELY BEFORE BEDTIME AS NEEDED Patient taking differently: 3 mg at bedtime. 04/29/21 10/26/21  Plovsky, Berneta Sages, MD  famotidine (PEPCID) 20 MG tablet Take 20 mg by mouth 2 (two) times daily as needed for heartburn or indigestion.    [provider]  fenofibrate 160 MG tablet Take 1 tablet (160 mg total) by mouth daily. 03/12/21   Roma Schanz R, DO  Flaxseed, Linseed, (FLAX SEED OIL PO) Take 1,000 mg by mouth daily.    [provider]  fluticasone (CUTIVATE) 0.62 % cream 1 application daily as needed (rash). 12/16/16   [provider]  influenza vaccine adjuvanted (FLUAD) 0.5 ML injection Inject into the muscle. 03/12/21   Carlyle Basques, MD  lisinopril-hydrochlorothiazide (ZESTORETIC) 10-12.5 MG tablet Take 1 tablet by mouth daily 04/02/21     LORazepam (ATIVAN) 0.5 MG tablet Take 2 tablets by mouth every mornig and 1 tablet at bedtime. Patient taking differently: Take 0.5-1 mg by mouth See admin instructions. Taking 1 mg in the AM and 0.5 mg at bedtime. 04/29/21   Plovsky, Berneta Sages, MD  Multiple Vitamin (MULTIVITAMIN) tablet Take 1 tablet by mouth daily.    [provider]  ondansetron (  ZOFRAN ODT) 4 MG disintegrating tablet Take 1 tablet (4 mg total) by mouth every 8 (eight) hours as needed for nausea or vomiting. 09/11/19   Carollee Herter, Alferd Apa, DO  pantoprazole (PROTONIX) 40 MG tablet TAKE 1 TABLET (40 MG TOTAL) BY MOUTH 2 (TWO) TIMES DAILY BEFORE A MEAL. Patient not taking: Reported on 07/22/2021 08/25/20 08/25/21  Vevelyn Francois, NP  sildenafil (VIAGRA) 100 MG tablet Take 1 tablet (100 mg total) by mouth as needed up to once daily. Patient not taking: Reported on 07/22/2021 10/22/20   Franchot Gallo, MD  tamsulosin (FLOMAX) 0.4 MG CAPS capsule Take 1 capsule (0.4  mg total) by mouth 2 (two) times daily. Patient taking differently: Take 0.4 mg by mouth daily after supper. 07/21/21     traMADol (ULTRAM) 50 MG tablet Take 1 tablet by mouth 3 times a day as needed. Patient taking differently: Take 50 mg by mouth every 8 (eight) hours as needed for moderate pain. 04/29/21     triamcinolone cream (KENALOG) 0.1 % Apply 1 application topically 2 (two) times daily. Patient taking differently: Apply 1 application topically 2 (two) times daily as needed (rash, itching). 12/31/20   Ann Held, DO      Allergies    Strawberry extract, Latex, Adhesive [tape], and Statins    Review of Systems   Review of Systems  Gastrointestinal:  Positive for vomiting.  Psychiatric/Behavioral:  Positive for confusion.    Physical Exam Updated Vital Signs BP (!) 160/92    Pulse (!) 101    Temp 98.3 F (36.8 C) (Oral)    Resp (!) 26    SpO2 100%  Physical Exam Vitals and nursing note reviewed.  Constitutional:      General: He is not in acute distress.    Appearance: He is well-developed.  HENT:     Head: Normocephalic and atraumatic.     Mouth/Throat:     Mouth: Mucous membranes are moist.  Eyes:     General: Vision grossly intact. Gaze aligned appropriately.     Extraocular Movements: Extraocular movements intact.     Conjunctiva/sclera: Conjunctivae normal.  Cardiovascular:     Rate and Rhythm: Normal rate and regular rhythm.     Pulses: Normal pulses.     Heart sounds: Normal heart sounds, S1 normal and S2 normal. No murmur heard.   No friction rub. No gallop.  Pulmonary:     Effort: Pulmonary effort is normal. No respiratory distress.     Breath sounds: Normal breath sounds.  Abdominal:     Palpations: Abdomen is soft.     Tenderness: There is no abdominal tenderness. There is no guarding or rebound.     Hernia: No hernia is present.  Musculoskeletal:        General: No swelling.     Cervical back: Full passive range of motion without pain, normal  range of motion and neck supple. No pain with movement, spinous process tenderness or muscular tenderness. Normal range of motion.     Right lower leg: No edema.     Left lower leg: No edema.  Skin:    General: Skin is warm and dry.     Capillary Refill: Capillary refill takes less than 2 seconds.     Findings: No ecchymosis, erythema, lesion or wound.  Neurological:     Mental Status: He is alert. He is confused.     GCS: GCS eye subscore is 4. GCS verbal subscore is 5. GCS motor  subscore is 6.     Cranial Nerves: Cranial nerves 2-12 are intact.     Sensory: Sensation is intact.     Motor: Motor function is intact. No weakness or abnormal muscle tone.     Coordination: Coordination is intact.     Comments: Patient can answer questions appropriately, appears to be oriented to person and place.  Responses are delayed, he is very somnolent and speech is slightly slurred  Psychiatric:        Speech: Speech is delayed and slurred.    ED Results / Procedures / Treatments   Labs (all labs ordered are listed, but only abnormal results are displayed) Labs Reviewed  CBC WITH DIFFERENTIAL/PLATELET - Abnormal; Notable for the following components:      Result Value   RBC 3.51 (*)    Hemoglobin 11.5 (*)    HCT 34.5 (*)    Neutro Abs 7.9 (*)    Abs Immature Granulocytes 0.08 (*)    All other components within normal limits  COMPREHENSIVE METABOLIC PANEL - Abnormal; Notable for the following components:   Glucose, Bld 213 (*)    BUN 32 (*)    Creatinine, Ser 2.47 (*)    Albumin 3.3 (*)    AST 226 (*)    ALT 115 (*)    GFR, Estimated 26 (*)    All other components within normal limits  LIPASE, BLOOD - Abnormal; Notable for the following components:   Lipase 59 (*)    All other components within normal limits  URINALYSIS, ROUTINE W REFLEX MICROSCOPIC - Abnormal; Notable for the following components:   Ketones, ur 5 (*)    Protein, ur 30 (*)    Leukocytes,Ua MODERATE (*)    Bacteria, UA  RARE (*)    All other components within normal limits  RESP PANEL BY RT-PCR (FLU A&B, COVID) ARPGX2  URINE CULTURE  PROTIME-INR  LACTIC ACID, PLASMA  LIPID PANEL  HEMOGLOBIN A1C  POC OCCULT BLOOD, ED  TYPE AND SCREEN  TROPONIN I (HIGH SENSITIVITY)  TROPONIN I (HIGH SENSITIVITY)    EKG EKG Interpretation  Date/Time:  Monday July 27 2021 02:57:24 EST Ventricular Rate:  93 PR Interval:  183 QRS Duration: 107 QT Interval:  362 QTC Calculation: 451 R Axis:   -54 Text Interpretation: Sinus or ectopic atrial rhythm Left anterior fascicular block Abnormal R-wave progression, early transition LVH with secondary repolarization abnormality Anterior Q waves, possibly due to LVH No significant change since last tracing Confirmed by Orpah Greek [phone]) on 07/27/2021 4:14:15 AM  Radiology CT HEAD WO CONTRAST (5MM)  Result Date: 07/27/2021 CLINICAL DATA:  Minor head trauma.  Vomiting and syncope EXAM: CT HEAD WITHOUT CONTRAST CT CERVICAL SPINE WITHOUT CONTRAST TECHNIQUE: Multidetector CT imaging of the head and cervical spine was performed following the standard protocol without intravenous contrast. Multiplanar CT image reconstructions of the cervical spine were also generated. RADIATION DOSE REDUCTION: This exam was performed according to the departmental dose-optimization program which includes automated exposure control, adjustment of the mA and/or kV according to patient size and/or use of iterative reconstruction technique. COMPARISON:  Brain MRI from 5 days ago FINDINGS: CT HEAD FINDINGS Brain: Interval and acute appearing infarct at the left stratum and corona radiata. Large remote right MCA territory infarct affecting the upper division. Brain atrophy. No acute hemorrhage or hydrocephalus Vascular: No hyperdense vessel or unexpected calcification. Skull: Normal. Negative for fracture or focal lesion. Sinuses/Orbits: No acute finding. CT CERVICAL SPINE FINDINGS Alignment: No  traumatic malalignment Skull base and vertebrae: No acute fracture Soft tissues and spinal canal: No prevertebral fluid or swelling. No visible canal hematoma. Disc levels:  Ordinary degenerative changes. 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 These results were called by telephone at the time of interpretation on 07/27/2021 at 4:17 am to provider [facility] , who verbally acknowledged these results. IMPRESSION: 1. 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 2. No posttraumatic intracranial or cervical spine finding. 3. Remote right MCA upper division infarcts. Electronically Signed   By: Jorje Guild M.D.   On: 07/27/2021 04:17   CT CERVICAL SPINE WO CONTRAST  Result Date: 07/27/2021 CLINICAL DATA:  Minor head trauma.  Vomiting and syncope EXAM: CT HEAD WITHOUT CONTRAST CT CERVICAL SPINE WITHOUT CONTRAST TECHNIQUE: Multidetector CT imaging of the head and cervical spine was performed following the standard protocol without intravenous contrast. Multiplanar CT image reconstructions of the cervical spine were also generated. RADIATION DOSE REDUCTION: This exam was performed according to the departmental dose-optimization program which includes automated exposure control, adjustment of the mA and/or kV according to patient size and/or use of iterative reconstruction technique. COMPARISON:  Brain MRI from 5 days ago FINDINGS: CT HEAD FINDINGS Brain: Interval and acute appearing infarct at the left stratum and corona radiata. Large remote right MCA territory infarct affecting the upper division. Brain atrophy. No acute hemorrhage or hydrocephalus Vascular: No hyperdense vessel or unexpected calcification. Skull: Normal. Negative for fracture or focal lesion. Sinuses/Orbits: No acute finding. CT CERVICAL SPINE FINDINGS Alignment: No traumatic malalignment Skull base and vertebrae: No acute fracture Soft tissues and spinal canal: No  prevertebral fluid or swelling. No visible canal hematoma. Disc levels:  Ordinary degenerative changes. 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 These results were called by telephone at the time of interpretation on 07/27/2021 at 4:17 am to provider [facility] , who verbally acknowledged these results. IMPRESSION: 1. 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 2. No posttraumatic intracranial or cervical spine finding. 3. Remote right MCA upper division infarcts. Electronically Signed   By: Jorje Guild M.D.   On: 07/27/2021 04:17   DG Chest Port 1 View  Result Date: 07/27/2021 CLINICAL DATA:  Fall EXAM: PORTABLE CHEST 1 VIEW COMPARISON:  11/13/2020 FINDINGS: Heart and mediastinal contours are within normal limits. No focal opacities or effusions. No acute bony abnormality. No visible rib fracture or pneumothorax. IMPRESSION: No active disease. Electronically Signed   By: Rolm Baptise M.D.   On: 07/27/2021 03:27   US ABDOMEN LIMITED RUQ (LIVER/GB)  Result Date: 07/27/2021 CLINICAL DATA:  Abdominal pain with elevated LFTs. EXAM: ULTRASOUND ABDOMEN LIMITED RIGHT UPPER QUADRANT COMPARISON:  CT without contrast 04/28/2021 FINDINGS: Gallbladder: The gallbladder is filled with stones, largest measurable stone approaching 1.5 cm. There is a thickened anterior free wall measuring 5 mm with a positive sonographic Murphy sign. This is concerning for acute cholecystitis. Common bile duct: Diameter: Unable to visualize. Liver: No focal lesion identified. There is increased hepatic echogenicity consistent with steatosis. Portal vein is patent on color Doppler imaging with normal direction of blood flow towards the liver. Other: 5 cm simple appearing cyst again noted in the upper pole right kidney. No free fluid is evident. IMPRESSION: 1. Mild thickening of the gallbladder with positive sonographic Murphy sign and multiple stones. Findings  concerning for acute cholecystitis. No pericholecystic fluid. 2. Fatty liver. 3. Unable to visualize the bile ducts  due to shadowing by the gallbladder and due to bowel gas. 4. 5 cm simple cyst right kidney. Electronically Signed   By: Telford Nab M.D.   On: 07/27/2021 06:10    Procedures Procedures    Medications Ordered in ED Medications  pantoprozole (PROTONIX) 80 mg /NS 100 mL infusion (8 mg/hr Intravenous New Bag/Given 07/27/21 0725)  pantoprazole (PROTONIX) injection 40 mg (has no administration in time range)  LORazepam (ATIVAN) injection 1 mg (has no administration in time range)  piperacillin-tazobactam (ZOSYN) IVPB 3.375 g (has no administration in time range)  ondansetron (ZOFRAN) injection 4 mg (4 mg Intravenous Given 07/27/21 0333)  pantoprazole (PROTONIX) 80 mg /NS 100 mL IVPB (0 mg Intravenous Stopped 07/27/21 0725)    ED Course/ Medical Decision Making/ A&P                           Medical Decision Making Amount and/or Complexity of Data Reviewed Labs: ordered. Radiology: ordered. ECG/medicine tests: ordered.  Risk Prescription drug management.   Patient presented to the emergency department after a fall at home.  He has a complex recent past medical history.  Patient was seen in the ER at [facility] on February 8 with altered mental status.  He had a thorough work-up including blood work, urinalysis, head CT and MRI of brain.  The only abnormality was evidence of urinary tract infection.  He was brought back to the ER the day after because his wife noticed that his speech was more slurred.  Nothing acute was found.  Upon arrival to the emergency department tonight he had vomited prior to arrival.  His face was covered in a dark brown substance which was initially thought to possibly be coffee-ground emesis, but his rectal is negative.  Wife reports that he ate chocolate cake prior to vomiting.  His hemoglobin is stable, no history of GI bleeding.  He does have mildly  elevated LFTs, AST greater than ALT.  Wife denies any alcohol intake.  Right upper quadrant ultrasound consistent with acute cholecystitis. General Surgery consulted, will see patient.  CT head and cervical spine were performed on arrival because of the fall.  No injuries noted but he is noted to have a new stroke that was not present on prior MRI. Dr. Cheral Marker consulting on patient.  CRITICAL CARE Performed by: Orpah Greek   Total critical care time: 40 minutes  Critical care time was exclusive of separately billable procedures and treating other patients.  Critical care was necessary to treat or prevent imminent or life-threatening deterioration.  Critical care was time spent personally by me on the following activities: development of treatment plan with patient and/or surrogate as well as nursing, discussions with consultants, evaluation of patient's response to treatment, examination of patient, obtaining history from patient or surrogate, ordering and performing treatments and interventions, ordering and review of laboratory studies, ordering and review of radiographic studies, pulse oximetry and re-evaluation of patient's condition.         Final Clinical Impression(s) / ED Diagnoses Final diagnoses:  Cerebrovascular accident (CVA), unspecified mechanism ([place])  Cholecystitis    Rx / DC Orders ED Discharge Orders     None         Destinie Thornsberry, Gwenyth Allegra, MD 07/27/21 [phone]

## 2021-07-27 NOTE — Assessment & Plan Note (Addendum)
Prior to arrival.  Patient has been seen on 2/8 and started on cefdinir, he took for 5 days.  Repeat urinalysis appears improved from prior but still noted moderate leukocytes with rare bacteria, and 21-50 WBCs.  Urine culture on 2/8 did not show any growth.  Urine culture was obtained this time, will follow.  Monitor off antibiotics

## 2021-07-27 NOTE — Assessment & Plan Note (Addendum)
Chronic.  Blood pressures 153/71-170/90 on admission.  Neurology felt patient was out of the window for permissive hypertension.  Home blood pressure medication regimen includes lisinopril-hydrochlorothiazide 10-12.5 mg daily and amlodipine 10 mg daily. -Held lisinopril-hydrochlorothiazide due to kidney function -Continue amlodipine -Hydralazine IV as needed for elevated blood pressure

## 2021-07-27 NOTE — ED Notes (Signed)
Taken to MRI by transporter.

## 2021-07-27 NOTE — Progress Notes (Signed)
Per RN, patient is at nuclear medicine and unavailable for EEG.  We wil re-attempt.

## 2021-07-27 NOTE — Progress Notes (Signed)
VASCULAR LAB    Carotid duplex has been performed.  See CV proc for preliminary results.   Beyonce Sawatzky, RVT 07/27/2021, 11:08 AM

## 2021-07-27 NOTE — Progress Notes (Signed)
Patient unavailable for EEG.  He is to relocate to [facility].

## 2021-07-27 NOTE — Progress Notes (Signed)
SLP Cancellation Note  Patient Details Name: William [name] MRN: [id number] DOB: Feb 10, 1941   Cancelled treatment:       Reason Eval/Treat Not Completed: Patient at procedure or test/unavailable. Pt NPO for procedure. Will f/u   [name], Katherene Ponto 07/27/2021, 11:27 AM

## 2021-07-27 NOTE — Assessment & Plan Note (Addendum)
Patient presented due to episodes of vomiting prior to arrival with reports of coffee-ground emesis.  No subsequent episodes since being admitted into the hospital.  Stool guaiacs were noted to be negative. Hemoglobin appears near baseline at this time.  .  Continue Protonix.  Nausea and vomiting have resolved

## 2021-07-27 NOTE — Assessment & Plan Note (Addendum)
Patient had a fall at home.  PT/OT recommending CIR

## 2021-07-27 NOTE — Assessment & Plan Note (Addendum)
Chronic.  On admission hemoglobin 11.5 which appears around patient's baseline.  There was concern for possibility of patient vomiting up blood.  Stool guaiacs were negative.  Hemoglobin dropped to the range of 9 most likely this is from hemodilution, he is on IV fluids. -Continue to monitor blood counts

## 2021-07-27 NOTE — ED Notes (Signed)
MD notified that pt failed swallow screen with previous shift and pt is was coughing and unable to tolerate a sip of water previously. Will continue to monitor.

## 2021-07-27 NOTE — Evaluation (Signed)
Clinical/Bedside Swallow Evaluation Patient Details  Name: William [name] MRN: [id number] Date of Birth: Feb 16, 1941  Today's Date: 07/27/2021 Time: SLP Start Time (ACUTE ONLY): 1645 SLP Stop Time (ACUTE ONLY): 1700 SLP Time Calculation (min) (ACUTE ONLY): 15 min  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   Mild, related to stroke history   Nocturia    Renal insufficiency 06/25/2013   S/P carotid endarterectomy    BILATERAL ICA--  PATENT PER DUPLEX  05-19-2012   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   Right calf   Urinary frequency    Vitamin D deficiency    Past Surgical History:  Past Surgical History:  Procedure Laterality Date   APPENDECTOMY  AS CHILD   CARDIOVASCULAR STRESS TEST  03-27-2012  William [name]   LOW RISK LEXISCAN STUDY-- PROBABLE NORMAL PERFUSION AND SOFT TISSUE ATTENUATION/  NO ISCHEMIA/ EF 51%   CAROTID ENDARTERECTOMY Bilateral LEFT  11-12-2008  William [name]   RIGHT ICA  2006  ([facility])   CYSTOSCOPY W/ RETROGRADES Bilateral 06/22/2021   Procedure: CYSTOSCOPY WITH RETROGRADE PYELOGRAM;  Surgeon: William Gallo, MD;  Location: [facility];  Service: Urology;  Laterality: Bilateral;   CYSTOSCOPY WITH LITHOLAPAXY N/A 02/26/2013   Procedure: CYSTOSCOPY WITH LITHOLAPAXY;  Surgeon: William Gallo, MD;  Location:  [facility];  Service: Urology;  Laterality: N/A;   EYE SURGERY  Jan. 2016   cataract surgery both eyes   INGUINAL HERNIA REPAIR Right 11-08-2006   IR KYPHO EA ADDL LEVEL THORACIC OR LUMBAR  02/12/2021   IR RADIOLOGIST EVAL & MGMT  02/18/2021   MASS EXCISION N/A 03/03/2016   Procedure: EXCISION OF BACK  MASS;  Surgeon: William Klein, MD;  Location: [place];  Service: General;  Laterality: N/A;   MOHS SURGERY Left 1/ 2016   William William [name]-- Basal cell   PROSTATE SURGERY     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-C N/A 06/22/2021   Procedure: TRANSURETHRAL RESECTION OF BLADDER TUMOR;  Surgeon: William Gallo, MD;  Location: [facility];  Service: Urology;  Laterality: N/A;   TRANSURETHRAL RESECTION OF PROSTATE N/A 02/26/2013   Procedure: TRANSURETHRAL RESECTION OF THE PROSTATE WITH GYRUS INSTRUMENTS;  Surgeon: William Gallo, MD;  Location: [facility];  Service: Urology;  Laterality: N/A;   TRANSURETHRAL RESECTION OF PROSTATE N/A 06/22/2021   Procedure: TRANSURETHRAL RESECTION OF THE PROSTATE (TURP);  Surgeon: William Gallo, MD;  Location: [facility];  Service: Urology;  Laterality: N/A;   HPI:  William [name] is a 81 y/o male presenting on 07/27/21 with AMS, slurred speech, facial droop, syncope, and difficulty walking. 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    Assessment / Plan / Recommendation  Clinical Impression  Pt participated in clinical swallow assessment.  He brushed his own teeth with minimal assistance. He presented with generally functional oropharyngeal swallow with no overt s/s of aspiration.  Mastication of solids was thorough.  There was no oral residue post swallow. Consumption of water did not lead to coughing over multiple trials. Cued  cough is weak; voice is weak/hypophonic.  Recommend resuming a regular diet with thin liquids; SLP will follow to ensure safety with current diet. Educated pt and his wife and daughter at bedside. SLP Visit Diagnosis: Dysphagia, unspecified (R13.10)           Diet Recommendation   Regular solids, thin liquids  Medication Administration: Whole meds with puree    Other  Recommendations Oral Care Recommendations: Oral care BID    Recommendations for follow up therapy are one component of a multi-disciplinary discharge planning process, led by the attending physician.  Recommendations may be updated based on patient status, additional functional criteria and insurance authorization.  Follow up Recommendations Acute inpatient rehab (3hours/day)      Assistance Recommended at Discharge    Functional Status Assessment Patient has had a recent decline in their functional status and demonstrates the ability to make significant improvements in function in a reasonable and predictable amount of time.  Frequency and Duration min 2x/week  2 weeks       Prognosis        Swallow Study   General HPI: William [name] is a 81 y/o male presenting on 07/27/21 with AMS, slurred speech, facial droop, syncope, and difficulty walking. 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 Type of Study: Bedside Swallow Evaluation Previous Swallow Assessment: none per chart Diet Prior to this Study: NPO Temperature Spikes Noted: No Respiratory Status: Room air History of Recent Intubation: No Behavior/Cognition: Alert Oral Cavity Assessment: Dry Oral Care Completed by SLP: Yes Oral Cavity - Dentition: Adequate natural dentition Vision: Functional for self-feeding Self-Feeding Abilities: Needs assist Patient Positioning: Upright in bed Baseline Vocal Quality: Low vocal  intensity Volitional Cough: Weak Volitional Swallow: Able to elicit    Oral/Motor/Sensory Function Overall Oral Motor/Sensory Function: Mild impairment Facial Symmetry: Abnormal symmetry left;Suspected CN VII (facial) dysfunction   Ice Chips Ice chips: Within functional limits   Thin Liquid Thin Liquid: Within functional limits    Nectar Thick Nectar Thick Liquid: Not tested   Honey Thick Honey Thick Liquid: Not tested   Puree Puree: Within functional limits   Solid     Solid: Within functional limits      [name] 07/27/2021,5:43 PM   Estill Bamberg L. Tivis Ringer, [place] Office number [phone] Pager [phone]

## 2021-07-27 NOTE — Evaluation (Signed)
Occupational Therapy Evaluation Patient Details Name: William [name] MRN: [id number] DOB: 1940-12-02 Today's Date: 07/27/2021   History of Present Illness Patient is a 81 y/o male presenting on 07/27/21 with AMS, slurred speech, facial droop, syncope, and difficulty walking consistent with MRI imaging revealing L basal ganglia and corona radiata CVA. 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   Clinical Impression   PTA patient independent with mobility intermittently using cane, spouse assisting with dressing but otherwise independent for ADLs, spouse assisting with IADls. Admitted for above and presenting with problem list below, including generalized weakness (L weaker than R), impaired balance, decreased activity tolerance, impaired cognition and commuincation.  He is oriented to self, follows 1 step commands with increased time and multimodal cueing, demonstrates decreased awareness, problem solving and attention.  He requires max assist +2 for bed mobility, min assist +2 for sit to stand at EOB and mod assist to total assist +2 for ADLs.  He will benefit from continued OT services acutely to optimize independence, safety and return to PLOF.      Recommendations for follow up therapy are one component of a multi-disciplinary discharge planning process, led by the attending physician.  Recommendations may be updated based on patient status, additional functional criteria and insurance authorization.   Follow Up Recommendations  Acute inpatient rehab (3hours/day)    Assistance Recommended at Discharge Frequent or constant Supervision/Assistance  Patient can return home with the following Two people to help with walking and/or transfers;Two people to help with bathing/dressing/bathroom;Assistance with cooking/housework;Direct supervision/assist for medications management;Direct supervision/assist for  financial management;Assist for transportation;Help with stairs or ramp for entrance    Functional Status Assessment  Patient has had a recent decline in their functional status and demonstrates the ability to make significant improvements in function in a reasonable and predictable amount of time.  Equipment Recommendations  BSC/3in1    Recommendations for Other Services Rehab consult     Precautions / Restrictions Precautions Precautions: Fall Restrictions Weight Bearing Restrictions: No      Mobility Bed Mobility Overal bed mobility: Needs Assistance Bed Mobility: Rolling, Supine to Sit Rolling: Max assist, +2 for physical assistance   Supine to sit: +2 for physical assistance, Max assist, +2 for safety/equipment     General bed mobility comments: for trunk and LB support, poor sequencing and initation of tasks    Transfers                          Balance Overall balance assessment: Needs assistance Sitting-balance support: Single extremity supported, Feet supported Sitting balance-Leahy Scale: Poor Sitting balance - Comments: needed mod assist to remain in sitting, fading to min guard at EOB Postural control: Posterior lean Standing balance support: Bilateral upper extremity supported, During functional activity Standing balance-Leahy Scale: Poor Standing balance comment: relies on BUE and external support, posterior lean                           ADL either performed or assessed with clinical judgement   ADL Overall ADL's : Needs assistance/impaired     Grooming: Sitting;Moderate assistance           Upper Body Dressing : Maximal assistance;Sitting   Lower Body Dressing: Total assistance;+2 for physical assistance;+2 for safety/equipment;Sit to/from stand   Toilet Transfer: Maximal assistance;+2 for physical assistance;+2 for safety/equipment Toilet  Transfer Details (indicate cue type and reason): simulated side stepping towards  [facility] Toileting- Clothing Manipulation and Hygiene: Total assistance;+2 for physical assistance;+2 for safety/equipment;Bed level Toileting - Clothing Manipulation Details (indicate cue type and reason): incontinent of bladder, total assist for hygiene and linen change             Vision   Vision Assessment?: Yes Additional Comments: brief assessment, difficulty scanning fully towards R side; able to locate # of fingers in all quadrants     Perception     Praxis      Pertinent Vitals/Pain Pain Assessment Pain Assessment: Faces Faces Pain Scale: Hurts little more Pain Descriptors / Indicators: Aching Pain Intervention(s): Limited activity within patient's tolerance, Monitored during session, Repositioned     Hand Dominance     Extremity/Trunk Assessment Upper Extremity Assessment Upper Extremity Assessment: RUE deficits/detail;LUE deficits/detail RUE Deficits / Details: Grossly 3/5 MMT, decreased coordination RUE Coordination: decreased fine motor;decreased gross motor LUE Deficits / Details: residual weakness from prior CVA, grossly 3-/5 (weaker than R side currenlty) and decreased coordination LUE Coordination: decreased fine motor;decreased gross motor          Communication Communication Communication: Expressive difficulties;Receptive difficulties   Cognition Arousal/Alertness: Lethargic Behavior During Therapy: Flat affect Overall Cognitive Status: Impaired/Different from baseline Area of Impairment: Attention, Problem solving, Awareness, Orientation, Memory, Following commands, Safety/judgement                 Orientation Level: Disoriented to, Place, Time, Situation Current Attention Level: Sustained Memory: Decreased short-term memory Following Commands: Follows one step commands with increased time, Follows one step commands consistently, Follows multi-step commands inconsistently Safety/Judgement: Decreased awareness of safety, Decreased awareness of  deficits Awareness: Intellectual Problem Solving: Slow processing, Decreased initiation, Difficulty sequencing, Requires verbal cues, Requires tactile cues General Comments: Pt oriented to self initally, able to recall month after re-orientation.  pt with poor awareness of deficits, decreased probelm solving and difficulty following mulitple step commands.     General Comments  VSS, mild dizziness with postitional changes but VSS; spouse present and supportive    Exercises     Shoulder Instructions      Home Living Family/patient expects to be discharged to:: Private residence Living Arrangements: Spouse/significant other Available Help at Discharge: Family (most of the time) Type of Home: House Home Access: Stairs to enter [employer] of Steps: 1   Home Layout: One level     Bathroom Shower/Tub: Teacher, early years/pre: Standard     Home Equipment: Cane - single point;Grab bars - tub/shower   Additional Comments: spouse works 20 hrs/week      Prior Functioning/Environment Prior Level of Function : Needs assist       Physical Assist : Mobility (physical);ADLs (physical) Mobility (physical): Stairs;Transfers ADLs (physical): Bathing;Dressing Mobility Comments: intermittently uses cane ADLs Comments: standing in shower, spouse assists with washing his hair, spouse assists with dressing; spouse assist with driving, IADLs        OT Problem List: Decreased strength;Decreased activity tolerance;Impaired balance (sitting and/or standing);Impaired UE functional use;Decreased knowledge of precautions;Decreased knowledge of use of DME or AE;Decreased safety awareness;Decreased cognition;Decreased coordination;Impaired vision/perception;Decreased range of motion      OT Treatment/Interventions: Self-care/ADL training;Therapeutic exercise;DME and/or AE instruction;Therapeutic activities;Cognitive remediation/compensation;Patient/family education;Balance  training;Visual/perceptual remediation/compensation;Neuromuscular education    OT Goals(Current goals can be found in the care plan section) Acute Rehab OT Goals Patient Stated Goal: none stated OT Goal Formulation: Patient unable to participate in goal setting Time For Goal  Achievement: 08/10/21 Potential to Achieve Goals: Good  OT Frequency: Min 2X/week    Co-evaluation PT/OT/SLP Co-Evaluation/Treatment: Yes Reason for Co-Treatment: For patient/therapist safety;To address functional/ADL transfers;Necessary to address cognition/behavior during functional activity PT goals addressed during session: Mobility/safety with mobility;Balance;Strengthening/ROM OT goals addressed during session: ADL's and self-care      AM-PAC OT "6 Clicks" Daily Activity     Outcome Measure Help from another person eating meals?: Total Help from another person taking care of personal grooming?: A Lot Help from another person toileting, which includes using toliet, bedpan, or urinal?: Total Help from another person bathing (including washing, rinsing, drying)?: A Lot Help from another person to put on and taking off regular upper body clothing?: A Lot Help from another person to put on and taking off regular lower body clothing?: Total 6 Click Score: 9   End of Session Equipment Utilized During Treatment: Gait belt Nurse Communication: Mobility status  Activity Tolerance: Patient tolerated treatment well Patient left: in bed;with call bell/phone within reach;with family/visitor present  OT Visit Diagnosis: Other abnormalities of gait and mobility (R26.89);Other symptoms and signs involving the nervous system (R29.898);Other symptoms and signs involving cognitive function                Time: 1105-1131 OT Time Calculation (min): 26 min Charges:  OT General Charges $OT Visit: 1 Visit OT Evaluation $OT Eval Moderate Complexity: 1 Mod  Jolaine Artist, OT Acute Rehabilitation Services Pager  [phone] Office [phone]   Delight Stare 07/27/2021, 1:05 PM

## 2021-07-27 NOTE — Evaluation (Signed)
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  Clinical Impression  Patient presents with the above problem with the impairments listed below. Patient required max assist x2 for bed mobility and min assist x2 for sit to stand for safety and physical assistance. Patient has coordination and sequencing deficits bilaterally and unable to take side steps this session. Patient has R sided weakness from acute stroke as well as residual L sided weakness from previous stroke. Patient was ambulating with a single point cane intermittently prior to admission. PT recommending inpatient rehab to maximize patient's functional mobility to baseline prior to ED admission. PT will continue to follow acutely.      Recommendations for follow up therapy are one component of a multi-disciplinary discharge planning process, led by the attending physician.  Recommendations may be updated based on patient status, additional functional criteria and insurance authorization.  Follow Up Recommendations Acute inpatient rehab (3hours/day)    Assistance Recommended at Discharge Frequent or constant Supervision/Assistance  Patient can return home with the following  Two people to help with walking and/or transfers;Two people to help with bathing/dressing/bathroom;Assistance with cooking/housework;Assistance with feeding;Direct supervision/assist for medications management;Direct supervision/assist for financial management;Assist for transportation;Help with stairs or ramp for entrance    Equipment  Recommendations Other (comment) (TBD)  Recommendations for Other Services       Functional Status Assessment Patient has had a recent decline in their functional status and demonstrates the ability to make significant improvements in function in a reasonable and predictable amount of time.     Precautions / Restrictions Precautions Precautions: Fall Restrictions Weight Bearing Restrictions: No      Mobility  Bed Mobility Overal bed mobility: Needs Assistance Bed Mobility: Rolling, Supine to Sit, Sit to Supine Rolling: Max assist, +2 for physical assistance   Supine to sit: +2 for physical assistance, Max assist, +2 for safety/equipment Sit to supine: Max assist, +2 for physical assistance, +2 for safety/equipment   General bed mobility comments: very lethargic; needed multimodal cues during supine to sit. Posterior lean present in sitting. Dizziness upon sitting resolved quickly. BP taken. BP WNL.    Transfers Overall transfer level: Needs assistance Equipment used: 2 person hand held assist Transfers: Sit to/from Stand Sit to Stand: Min assist, +2 physical assistance, +2 safety/equipment           General transfer comment: Required min assist x2 for lift assist and steadying. Patient able to weight bear through RLE without buckling. He had slight dizziness present upon standing that quickly passed. BP taken in standing. BP WNL.    Ambulation/Gait               General Gait Details: patient unable to coordinate weight shifts to side step at EOB.  Stairs            Wheelchair Mobility    Modified Rankin (Stroke Patients Only) Modified Rankin (Stroke Patients Only) Pre-Morbid Rankin Score: Moderate disability Modified Rankin: Moderately severe disability     Balance Overall balance assessment: Needs assistance Sitting-balance support: Single extremity supported, Feet supported Sitting balance-Leahy Scale: Poor Sitting balance - Comments:  needed mod  assist to remain in sitting Postural control: Posterior lean Standing balance support: Bilateral upper extremity supported Standing balance-Leahy Scale: Poor Standing balance comment: required handheld assist with min asssit x2 for safety and physical assitance.                             Pertinent Vitals/Pain Pain Assessment Pain Assessment: Faces Faces Pain Scale: Hurts little more Pain Location: generalized Pain Descriptors / Indicators: Aching Pain Intervention(s): Limited activity within patient's tolerance, Monitored during session    [place] expects to be discharged to:: Private residence Living Arrangements: Spouse/significant other Available Help at Discharge: Family (most of the time) Type of Home: House Home Access: Stairs to enter   [employer] of Steps: 1   Home Layout: One level Home Equipment: Cane - single point;Grab bars - tub/shower Additional Comments: spouse works 20 hrs/week    Prior Function Prior Level of Function : Needs assist       Physical Assist : Mobility (physical);ADLs (physical) Mobility (physical): Stairs;Transfers ADLs (physical): Bathing;Dressing Mobility Comments: intermittently uses cane ADLs Comments: standing in shower, spouse assists with washing his hair, spouse assists with dressing; spouse assist with driving, IADLs     Hand Dominance        Extremity/Trunk Assessment   Upper Extremity Assessment Upper Extremity Assessment: RUE deficits/detail;LUE deficits/detail RUE Deficits / Details: Grossly 3/5 MMT, decreased coordination RUE Coordination: decreased fine motor;decreased gross motor LUE Deficits / Details: residual weakness from prior CVA, grossly 3-/5 (weaker than R side currenlty) and decreased coordination LUE Coordination: decreased fine motor;decreased gross motor    Lower Extremity Assessment Lower Extremity Assessment: Defer to PT evaluation RLE Deficits / Details:  generalized weakness consistent with acute stroke. Able to wiggle toes and DF and PF ankle. Unable to keep knee flexed in heel slide position. RLE Coordination: decreased gross motor (unable to coordinate and sequence side steps at EOB with diffculty initiating weight shifts. Able to slide foot ~ 2 cm with attempts to side step.) LLE Deficits / Details: residual weakness from prior R MCA stroke, however, likely exacerbated from recent CVA. Initiated muscle contraction to wiggle toes and move ankle. Able to bend L knee and hip, however, required assist. LLE Coordination: decreased gross motor (unable to coordinate and sequence side steps at EOB with diffculty initiating weight shifts.)    Cervical / Trunk Assessment Cervical / Trunk Assessment: Kyphotic  Communication   Communication: Expressive difficulties;Receptive difficulties  Cognition Arousal/Alertness: Lethargic Behavior During Therapy: Flat affect Overall Cognitive Status: Impaired/Different from baseline Area of Impairment: Attention, Problem solving, Awareness, Orientation, Memory, Following commands, Safety/judgement                 Orientation Level: Disoriented to, Place, Time, Situation (some short term memory still intact; when neurologist resident asked patient A+O questions during assessment, he recalled me telling him where he was and what month it was.) Current Attention Level: Sustained Memory: Decreased short-term memory, Decreased recall of precautions Following Commands: Follows one step commands with increased time, Follows multi-step commands inconsistently Safety/Judgement: Decreased awareness of safety, Decreased awareness of deficits Awareness: Intellectual Problem Solving: Slow processing, Decreased initiation, Difficulty sequencing General Comments: Patient needed mulitimodal cues throughout session.Difficulty sequencing when attempting to take steps.        General Comments General comments (skin  integrity, edema, etc.): VSS, mild dizziness with postitional changes but VSS; spouse present and supportive    Exercises  Assessment/Plan    PT Assessment Patient needs continued PT services  PT Problem List Decreased strength;Decreased activity tolerance;Decreased balance;Decreased coordination;Decreased mobility;Decreased safety awareness;Decreased knowledge of use of DME;Decreased cognition       PT Treatment Interventions Gait training;DME instruction;Functional mobility training;Therapeutic activities;Therapeutic exercise;Balance training;Neuromuscular re-education;Cognitive remediation;Patient/family education    PT Goals (Current goals can be found in the Care Plan section)  Acute Rehab PT Goals Patient Stated Goal: to go home PT Goal Formulation: With patient/family Time For Goal Achievement: 08/10/21 Potential to Achieve Goals: Fair    Frequency Min 4X/week     Co-evaluation PT/OT/SLP Co-Evaluation/Treatment: Yes Reason for Co-Treatment: For patient/therapist safety;To address functional/ADL transfers;Necessary to address cognition/behavior during functional activity PT goals addressed during session: Mobility/safety with mobility;Balance;Strengthening/ROM OT goals addressed during session: ADL's and self-care       AM-PAC PT "6 Clicks" Mobility  Outcome Measure Help needed turning from your back to your side while in a flat bed without using bedrails?: Total Help needed moving from lying on your back to sitting on the side of a flat bed without using bedrails?: Total Help needed moving to and from a bed to a chair (including a wheelchair)?: Total Help needed standing up from a chair using your arms (e.g., wheelchair or bedside chair)?: Total Help needed to walk in hospital room?: Total Help needed climbing 3-5 steps with a railing? : Total 6 Click Score: 6    End of Session Equipment Utilized During Treatment: Gait belt;Oxygen Activity Tolerance: Patient  limited by lethargy;Patient limited by fatigue Patient left: in bed;with call bell/phone within reach;with family/visitor present (on stretcher in ED) Nurse Communication: Mobility status PT Visit Diagnosis: Muscle weakness (generalized) (M62.81);Unsteadiness on feet (R26.81);Difficulty in walking, not elsewhere classified (R26.2);Dizziness and giddiness (R42);Hemiplegia and hemiparesis Hemiplegia - Right/Left: Left Hemiplegia - caused by: Cerebral infarction    Time: 1105-1131 PT Time Calculation (min) (ACUTE ONLY): 26 min   Charges:   PT Evaluation $PT Eval Moderate Complexity: [address] SPT  [place] 07/27/2021, 1:21 PM

## 2021-07-27 NOTE — H&P (Signed)
History and Physical    Patient: William [name] ERX:[id number] DOB: 1941-05-16 DOA: 07/27/2021 DOS: the patient was seen and examined on 07/27/2021 PCP: Ann Held, DO  Patient coming from: Home via EMS  Chief Complaint:  Chief Complaint  Patient presents with   Vomiting and fall    HPI: William [name] is a 81 y.o. male with medical history significant of hypertension, hyperlipidemia, CVA with residual left-sided hemiplegia in 2006, carotid artery stenosis s/p bilateral CEA, CKD stage IV, BPH, bladder cancer s/p Turp, and GERD presents with complaints of vomiting and fall.  History is mostly obtained from the patient's wife who is present at bedside as the patient appears to be altered.  At baseline he normally gets around without assistance, but she does report " intermittent nervous tics".  His wife had called EMS on 2/8, after waking up finding him in the bathroom seeming to be confused having difficulty following directions, worsening left-sided facial droop, and trouble walking.  While in the ED he had extensive work-up including CT and MRI of the brain which did not note any acute abnormalities.  Urinalysis however did give concern for UTI for which he was started on cefdinir.  His symptoms seem to improve and he was discharged home.  He returned to the hospital the following day due to confusion and slurred speech, but symptoms were thought possibly to be related to UTI that he was currently on treatment for no further work-up was performed at that time.  His wife reports that he was taking the cefdinir as prescribed.  Yesterday afternoon around 4 PM or so he had eaten some chocolate cake.  This morning and around 2:30-3 AM his wife reports that she was awakened by him vomiting what appeared to be fine coffee grounds in the bedroom.  Denies seeing any visible blood.  He had gone to the bathroom to clean himself up and the next thing she knows she found him on the floor.  She is not  sure what led to the patient following, but called EMS thereafter.  She reports that he has been lethargic and sleepy since symptoms started and not really responding like he normally would for last 5 days.  He does complain of some right sided abdominal pain, back pain, and continued nausea.  Denies fever, chest pain, shortness of breath, or dysuria.  Admission into the emergency department patient was noted to be afebrile, tachycardic, tachypneic blood pressures 153/71-170/90, and O2 saturations currently maintained on 2 L of nasal cannula oxygen.  Oxygenation had dropped as low as 73% on room air, but pulse ox is on patient's hand and he is constantly moving.  Labs significant for hemoglobin 11.5, BUN 32, creatinine 2.47, glucose 213, AST 226, ALT 115, lipase 59, high-sensitivity troponin negative, and lactic acid 1.8.  CT of the head and cervical spine was significant for a acute perforator infarct at the left basal ganglia and corona radiata.  Neurology had been consulted.  Urinalysis noted moderate leukocytes with rare bacteria and 21-50 WBCs.  Fecal occult was negative.  Due to right upper quadrant abdominal tenderness ultrasound was obtained which noted mild thickening of the gallbladder with positive sonographic Murphy sign and multiple gallstones.  Patient had been given Zofran, Zosyn due to the concern for cholecystitis and started on a Protonix drip per protocol.    Review of Systems: As mentioned in the history of present illness. All other systems reviewed and are negative. 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   Mohs surgery jan 2016    Bladder stone    BPH (benign prostatic hyperplasia) 08/06/2007   Carotid artery occlusion    Chronic kidney disease 2014   Stage III   Closed fracture of fifth metacarpal bone 05/15/2015   Eczema    Fasting hyperglycemia 12/21/2006   GERD (gastroesophageal reflux disease)    History  of carotid artery stenosis    S/P BILATERAL CEA   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   HTN (hypertension) 07/19/2015   Hyperlipidemia    Hypertension    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   Mild, related to stroke history   Nocturia    Renal insufficiency 06/25/2013   S/P carotid endarterectomy    BILATERAL ICA--  PATENT PER DUPLEX  05-19-2012   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   Right calf   Urinary frequency    Vitamin D deficiency    Past Surgical History:  Procedure Laterality Date   APPENDECTOMY  AS CHILD   CARDIOVASCULAR STRESS TEST  03-27-2012  DR CRENSHAW   LOW RISK LEXISCAN STUDY-- PROBABLE NORMAL PERFUSION AND SOFT TISSUE ATTENUATION/  NO ISCHEMIA/ EF 51%   CAROTID ENDARTERECTOMY Bilateral LEFT  11-12-2008  DR GREG HAYES   RIGHT ICA  2006  ([facility])   CYSTOSCOPY W/ RETROGRADES Bilateral 06/22/2021   Procedure: CYSTOSCOPY WITH RETROGRADE PYELOGRAM;  Surgeon: Franchot Gallo, MD;  Location: [facility];  Service: Urology;  Laterality: Bilateral;   CYSTOSCOPY WITH LITHOLAPAXY N/A 02/26/2013   Procedure: CYSTOSCOPY WITH LITHOLAPAXY;  Surgeon: Franchot Gallo, MD;  Location: [facility];  Service: Urology;  Laterality: N/A;   EYE SURGERY  Jan. 2016   cataract surgery both eyes   INGUINAL HERNIA REPAIR Right 11-08-2006   IR KYPHO EA ADDL LEVEL THORACIC OR LUMBAR  02/12/2021   IR RADIOLOGIST EVAL & MGMT  02/18/2021   MASS EXCISION N/A 03/03/2016   Procedure: EXCISION OF BACK  MASS;  Surgeon: Stark Klein, MD;  Location: [facility];  Service: General;  Laterality: N/A;   MOHS SURGERY Left 1/ 2016   Dr Nevada Crane-- Basal cell   PROSTATE SURGERY     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-C N/A 06/22/2021   Procedure: TRANSURETHRAL RESECTION OF BLADDER TUMOR;  Surgeon: Franchot Gallo, MD;  Location: [facility];  Service: Urology;  Laterality: N/A;    TRANSURETHRAL RESECTION OF PROSTATE N/A 02/26/2013   Procedure: TRANSURETHRAL RESECTION OF THE PROSTATE WITH GYRUS INSTRUMENTS;  Surgeon: Franchot Gallo, MD;  Location: [facility];  Service: Urology;  Laterality: N/A;   TRANSURETHRAL RESECTION OF PROSTATE N/A 06/22/2021   Procedure: TRANSURETHRAL RESECTION OF THE PROSTATE (TURP);  Surgeon: Franchot Gallo, MD;  Location: [facility];  Service: Urology;  Laterality: N/A;   Social History:  reports that he quit smoking about 16 years ago. His smoking use included cigarettes. He has a 80.00 pack-year smoking history. He has never used smokeless tobacco. He reports current alcohol use. He reports that he does not use drugs.  Allergies  Allergen Reactions   Strawberry Extract Hives   Latex Itching   Adhesive [Tape] Other (See Comments)    BLISTER   Statins Other (See Comments)    myalgias    Family History  Problem Relation Age of Onset   Heart disease Mother  CHF   Bipolar disorder Mother    Heart disease Father        CHF    Prior to Admission medications   Medication Sig Start Date End Date Taking? Authorizing Provider  acetaminophen (TYLENOL) 500 MG tablet Take 500 mg by mouth every 6 (six) hours as needed (1 tab as needed).    [provider]  amLODipine (NORVASC) 10 MG tablet Take 1 tablet by mouth daily 06/17/21     aspirin 325 MG EC tablet Take 325 mg by mouth daily.    [provider]  carbamazepine (TEGRETOL XR) 100 MG 12 hr tablet TAKE 1 TABLET BY MOUTH EVERY MORNING AND 2 AT BEDTIME Patient taking differently: 100-200 mg See admin instructions. Taking 100 mg in the Am and 200 mg at bedtime 04/29/21 04/29/22  Plovsky, Berneta Sages, MD  cefdinir (OMNICEF) 300 MG capsule Take 1 capsule (300 mg total) by mouth 2 (two) times daily for 7 days. 07/22/21 07/29/21  Tegeler, Gwenyth Allegra, MD  Cholecalciferol (VITAMIN D) 50 MCG (2000 UT) CAPS Take 2,000 Units by mouth daily.    [provider]  COVID-19 mRNA bivalent vaccine, Pfizer, injection Inject into the muscle. 03/12/21   Carlyle Basques, MD  COVID-19 mRNA Vac-TriS, Pfizer, (PFIZER-BIONT COVID-19 VAC-TRIS) SUSP injection Inject into the muscle. 09/23/20   Carlyle Basques, MD  Eszopiclone 3 MG TABS TAKE 1 TABLET BY MOUTH IMMEDIATELY BEFORE BEDTIME AS NEEDED Patient taking differently: 3 mg at bedtime. 04/29/21 10/26/21  Plovsky, Berneta Sages, MD  famotidine (PEPCID) 20 MG tablet Take 20 mg by mouth 2 (two) times daily as needed for heartburn or indigestion.    [provider]  fenofibrate 160 MG tablet Take 1 tablet (160 mg total) by mouth daily. 03/12/21   Roma Schanz R, DO  Flaxseed, Linseed, (FLAX SEED OIL PO) Take 1,000 mg by mouth daily.    [provider]  fluticasone (CUTIVATE) 2.99 % cream 1 application daily as needed (rash). 12/16/16   [provider]  influenza vaccine adjuvanted (FLUAD) 0.5 ML injection Inject into the muscle. 03/12/21   Carlyle Basques, MD  lisinopril-hydrochlorothiazide (ZESTORETIC) 10-12.5 MG tablet Take 1 tablet by mouth daily 04/02/21     LORazepam (ATIVAN) 0.5 MG tablet Take 2 tablets by mouth every mornig and 1 tablet at bedtime. Patient taking differently: Take 0.5-1 mg by mouth See admin instructions. Taking 1 mg in the AM and 0.5 mg at bedtime. 04/29/21   Plovsky, Berneta Sages, MD  Multiple Vitamin (MULTIVITAMIN) tablet Take 1 tablet by mouth daily.    [provider]  ondansetron (ZOFRAN ODT) 4 MG disintegrating tablet Take 1 tablet (4 mg total) by mouth every 8 (eight) hours as needed for nausea or vomiting. 09/11/19   Carollee Herter, Alferd Apa, DO  pantoprazole (PROTONIX) 40 MG tablet TAKE 1 TABLET (40 MG TOTAL) BY MOUTH 2 (TWO) TIMES DAILY BEFORE A MEAL. Patient not taking: Reported on 07/22/2021 08/25/20 08/25/21  Vevelyn Francois, NP  sildenafil (VIAGRA) 100 MG tablet Take 1 tablet (100 mg total) by mouth as needed up to once daily. Patient not taking:  Reported on 07/22/2021 10/22/20   Franchot Gallo, MD  tamsulosin (FLOMAX) 0.4 MG CAPS capsule Take 1 capsule (0.4 mg total) by mouth 2 (two) times daily. Patient taking differently: Take 0.4 mg by mouth daily after supper. 07/21/21     traMADol (ULTRAM) 50 MG tablet Take 1 tablet by mouth 3 times a day as needed. Patient taking differently: Take 50 mg  by mouth every 8 (eight) hours as needed for moderate pain. 04/29/21     triamcinolone cream (KENALOG) 0.1 % Apply 1 application topically 2 (two) times daily. Patient taking differently: Apply 1 application topically 2 (two) times daily as needed (rash, itching). 12/31/20   Ann Held, DO    Physical Exam: Vitals:   07/27/21 0400 07/27/21 0500 07/27/21 0628 07/27/21 0715  BP: (!) 163/80 (!) 153/71 (!) 164/89 (!) 160/92  Pulse: 87 98 (!) 108 (!) 101  Resp: (!) 22 (!) 25 (!) 26 (!) 26  Temp:      TempSrc:      SpO2: 96% 97% 92% 100%   Exam  Constitutional: Elderly male who appears to be in distress Eyes: PERRL, he appears to have a left-sided gaze preference ENMT: Mucous membranes are moist.  Dried brown material present in the right ear. Neck: normal, supple, no masses, no thyromegaly Respiratory: Mildly tachypneic with no significant wheezes or rhonchi appreciated.  Currently on 2 L nasal cannula oxygen with O2 saturation maintained. Cardiovascular: Tachycardic without significant murmur appreciated.  No extremity edema.  No carotid bruits appreciated.  Abdomen: no tenderness, no masses palpated. No hepatosplenomegaly. Bowel sounds positive.  Musculoskeletal: no clubbing / cyanosis. No joint deformity upper and lower extremities. Good ROM, no contractures. Normal muscle tone.  No Skin: no rashes, lesions, ulcers. No induration Neurologic: CN 2-12 grossly intact.  Left-sided facial droop present.  Right upper extremity 4/5 strength, right lower extremity 2/5 strength, left upper extremity 3/5 strength, and right lower extremity  2/5 strength. Psychiatric: Lethargic but oriented at least to person and place.  Pin rolling tremor present at rest noted intermittently throughout the exam in both hands.  Data Reviewed:  EKG significant for sinus rhythm at 93 bpm with left anterior fascicular block and anterior Q waves  Assessment and Plan: * CVA (cerebral vascular accident) ([place])- (present on admission) Patient presents after having a fall with reports altered mental status and slurred speech.  Imaging including MRI from 2/8 had not noted any signs of stroke.  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  Neurology has been consulted.  He was not a tPA candidate as unknown time of onset.  Hemoglobin A1c was 5.3. -Admit to a telemetry bed -Stroke order set initiated -Neuro checks -N.p.o. until able to pass swallow evaluation -Check  MRI/MRA head  -Check carotid vascular ultrasound -Follow-up lipid panel -Check echocardiogram -Check EEG due to continued confusion -PT/OT/Speech to eval and treat -Aspirin(will need to verify dose) and Plavix added as patient had failed monotherapy  -Appreciate neurology consultative services, will follow-up for any further recommendations  Cholelithiasis- (present on admission) Patient complained of some right-sided abdominal pain.  Ultrasound noted multiple gallstones up to 1.5 cm with mild gallbladder wall thickening with positive sonographic Murphy sign.  Gallbladder duct was poorly seen.  General surgery has been formally consulted and patient was started on empiric antibiotics of Zosyn.  General surgery evaluated and recommended HIDA scan.  He is followed by Dr. Benson Norway of gastroenterology in the outpatient setting. -Continue Zosyn -Follow-up HIDA scan -Appreciate general surgery  consultative services, will follow-up for further recommendations -Will formally consult Dr. Benson Norway if needed  Acute metabolic encephalopathy- (present on admission) Normally patient is alert and oriented and able to  follow commands.  However over the last 5 days wife notes that he had been more lethargic and was confused not able to really follow commands. -Continue neurochecks -Added-on TSH  -Follow-up EEG  Nausea and vomiting- (present on admission) Patient presented due to episodes of vomiting prior to arrival with reports of coffee-ground emesis.  No subsequent episodes since being admitted into the hospital.  Stool guaiacs were noted to be negative.  He has been typed and screened for possible need of blood products.  Hemoglobin appears near baseline at this time.  He had initially been started on a Protonix drip per protocol. -Check gastric occult if patient has another episode of vomiting -Recheck H&H later today and will continue to follow -Antiemetics as needed -Discontinued continuous drip and placed on Protonix 40 mg IV twice daily  Hypoxia- (present on admission) Acute.  Normally patient not on oxygen at baseline O2 saturation documented as low as 73%, for which patient was placed on 2 L of nasal cannula oxygen.  Chest x-ray was otherwise noted to be clear and patient denies any complaints of shortness of breath.  While in the room O2 saturations intermittently dipped into the 80s on 2 L, but patient with pin rolling tremor where pulse ox is located which may be leading to drops. -Aspiration precautions -Continuous pulse oximetry and continue nasal cannula oxygen to maintain O2 saturation greater than 92% -Wean to room air as able  UTI (urinary tract infection)- (present on admission) Prior to arrival.  Patient has been seen on 2/8 and started on cefdinir.  Repeat urinalysis appears improved from prior but still noted moderate leukocytes with rare bacteria, and 21-50 WBCs.  Urine  culture was obtained. -Follow-up urine culture -Patient has been placed on empiric antibiotics of Zosyn due to concern for cholecystitis  Normocytic anemia- (present on admission) Chronic.  On admission hemoglobin 11.5 which appears around patient's baseline.  There was concern for possibility of patient vomiting up blood.  Stool guaiacs were negative. -Continue to monitor blood counts  Hyperglycemia- (present on admission) Acute.  On admission glucose was initially elevated up to 213.  Hemoglobin A1c was able to be obtained and noted to be 5.3.  Suspect elevated glucose secondary to acute stress response.   -Continue to monitor   CKD (chronic kidney disease), stage IV ([place])- (present on admission) On admission creatinine 2.47 with BUN 32. Creatinine as of this year has ranged from 2.2-2.4. -Hold possible nephrotoxic agents -Continue to monitor kidney function  Fall at home, initial encounter Patient had a fall at home.  Cause of fall is not totally clear at this time. -Follow-up PT and OT recommendations  Transaminitis- (present on admission) Acute on chronic.  On admission AST 226 and ALT 115.  Records note ALT has been mildly elevated intermittently previously in the past.  However, given mild gallbladder wall thickening and multiple gallstones present with poor visualization of the common bile duct, but question possibility of choledocholithiasis as a cause of elevated liver enzymes.  On the differential also includes rhabdomyolysis given recent fall.   -Check CK -Recheck LFTs in a.m.  Hyperlipidemia- (present on admission) Chronic.  Last lipid panel obtained 02/03/2021 noted total cholesterol 189, LDL 115, HDL 53.6, and triglycerides 102.  Home medication regimen included fenofibrate 160 mg daily.  He has a prior history of intolerance to statins. -Follow-up lipid panel  HTN (hypertension)- (present on admission) Chronic.  Blood pressures 153/71-170/90 on admission.  Neurology felt  patient was out of the  window for permissive hypertension.  Home blood pressure medication regimen includes lisinopril-hydrochlorothiazide -Hydralazine IV as needed for elevated blood pressure  History of right MCA infarct- (present on admission) Patient has been on aspirin daily and fenofibrate.  Carotid artery stenosis, asymptomatic, bilateral- (present on admission) Patient with a prior history of carotid artery stenosis status post bilateral carotid artery endarterectomies in 2010.      DVT prophylaxis: SCDs.  Consider resuming subcu DVT prophylaxis if no signs of bleeding. Advance Care Planning:   Code Status: Prior   Consults: Neurology and general surgery  Family Communication: Wife updated at bedside  Severity of Illness: The appropriate patient status for this patient is INPATIENT. Inpatient status is judged to be reasonable and necessary in order to provide the required intensity of service to ensure the patient's safety. The patient's presenting symptoms, physical exam findings, and initial radiographic and laboratory data in the context of their chronic comorbidities is felt to place them at high risk for further clinical deterioration. Furthermore, it is not anticipated that the patient will be medically stable for discharge from the hospital within 2 midnights of admission.   * I certify that at the point of admission it is my clinical judgment that the patient will require inpatient hospital care spanning beyond 2 midnights from the point of admission due to high intensity of service, high risk for further deterioration and high frequency of surveillance required.*  Author: Norval Morton, MD 07/27/2021 7:21 AM  For on call review [email or web address].

## 2021-07-27 NOTE — Progress Notes (Addendum)
STROKE TEAM PROGRESS NOTE   SUBJECTIVE (INTERVAL HISTORY) His wife is at the bedside.  Overall his condition is unchanged. Wife reports that over the past week, patient has had worsening AMS, thought to be from a UTI. Last night, he had an episode of emesis and fell, with R sided weakness then was BIBEMS as a code stroke. Patient was already with L sided deficits from 2006 stroke, now with R sided weakness. This morning, patient remains generally weak, lethargic, and dysarthric.   CTH with acute infarct at L basal ganglia and corona radiata as well as remote R MCA infarcts. MRI redemonstrates the same. MRA with no large vessel occlusions but severe distal L M1 MCA stenosis and moderate R ICA stenosis.    OBJECTIVE CBC:  Recent Labs  Lab 07/22/21 0633 07/22/21 0653 07/27/21 0307  WBC 7.3  --  9.8  NEUTROABS 4.1  --  7.9*  HGB 11.2* 11.9* 11.5*  HCT 34.6* 35.0* 34.5*  MCV 98.3  --  98.3  PLT 265  --  725   Basic Metabolic Panel:  Recent Labs  Lab 07/22/21 0633 07/22/21 0653 07/27/21 0307  NA 139 141 141  K 4.1 4.2 4.2  CL 105 108 106  CO2 22  --  24  GLUCOSE 110* 108* 213*  BUN 31* 31* 32*  CREATININE 2.28* 2.40* 2.47*  CALCIUM 9.1  --  9.1   Lipid Panel:  Recent Labs  Lab 07/27/21 0717  CHOL 209*  TRIG 73  HDL 69  CHOLHDL 3.0  VLDL 15  LDLCALC 125*   HgbA1c:  Recent Labs  Lab 07/27/21 0307  HGBA1C 5.3   Urine Drug Screen:  Recent Labs  Lab 07/22/21 0728  LABOPIA NONE DETECTED  COCAINSCRNUR NONE DETECTED  LABBENZ POSITIVE*  AMPHETMU NONE DETECTED  THCU NONE DETECTED  LABBARB NONE DETECTED    Alcohol Level  Recent Labs  Lab 07/22/21 0633  ETH <10    IMAGING past 24 hours CT HEAD WO CONTRAST (5MM)  Result Date: 07/27/2021 CLINICAL DATA:  Minor head trauma.  Vomiting and syncope EXAM: CT HEAD WITHOUT CONTRAST CT CERVICAL SPINE WITHOUT CONTRAST TECHNIQUE: Multidetector CT imaging of the head and cervical spine was performed following the standard  protocol without intravenous contrast. Multiplanar CT image reconstructions of the cervical spine were also generated. RADIATION DOSE REDUCTION: This exam was performed according to the departmental dose-optimization program which includes automated exposure control, adjustment of the mA and/or kV according to patient size and/or use of iterative reconstruction technique. COMPARISON:  Brain MRI from 5 days ago FINDINGS: CT HEAD FINDINGS Brain: Interval and acute appearing infarct at the left stratum and corona radiata. Large remote right MCA territory infarct affecting the upper division. Brain atrophy. No acute hemorrhage or hydrocephalus Vascular: No hyperdense vessel or unexpected calcification. Skull: Normal. Negative for fracture or focal lesion. Sinuses/Orbits: No acute finding. CT CERVICAL SPINE FINDINGS Alignment: No traumatic malalignment Skull base and vertebrae: No acute fracture Soft tissues and spinal canal: No prevertebral fluid or swelling. No visible canal hematoma. Disc levels:  Ordinary degenerative changes. 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 These results were called by telephone at the time of interpretation on 07/27/2021 at 4:17 am to provider [facility] , who verbally acknowledged these results. IMPRESSION: 1. 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 2. No posttraumatic intracranial or cervical spine finding. 3. Remote right MCA upper division infarcts. Electronically Signed   By: Roderic Palau  Watts M.D.   On: 07/27/2021 04:17   CT CERVICAL SPINE WO CONTRAST  Result Date: 07/27/2021 CLINICAL DATA:  Minor head trauma.  Vomiting and syncope EXAM: CT HEAD WITHOUT CONTRAST CT CERVICAL SPINE WITHOUT CONTRAST TECHNIQUE: Multidetector CT imaging of the head and cervical spine was performed following the standard protocol without intravenous contrast. Multiplanar CT image reconstructions of the cervical spine were also  generated. RADIATION DOSE REDUCTION: This exam was performed according to the departmental dose-optimization program which includes automated exposure control, adjustment of the mA and/or kV according to patient size and/or use of iterative reconstruction technique. COMPARISON:  Brain MRI from 5 days ago FINDINGS: CT HEAD FINDINGS Brain: Interval and acute appearing infarct at the left stratum and corona radiata. Large remote right MCA territory infarct affecting the upper division. Brain atrophy. No acute hemorrhage or hydrocephalus Vascular: No hyperdense vessel or unexpected calcification. Skull: Normal. Negative for fracture or focal lesion. Sinuses/Orbits: No acute finding. CT CERVICAL SPINE FINDINGS Alignment: No traumatic malalignment Skull base and vertebrae: No acute fracture Soft tissues and spinal canal: No prevertebral fluid or swelling. No visible canal hematoma. Disc levels:  Ordinary degenerative changes. 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 These results were called by telephone at the time of interpretation on 07/27/2021 at 4:17 am to provider [facility] , who verbally acknowledged these results. IMPRESSION: 1. 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 2. No posttraumatic intracranial or cervical spine finding. 3. Remote right MCA upper division infarcts. Electronically Signed   By: Jorje Guild M.D.   On: 07/27/2021 04:17   MR ANGIO HEAD WO CONTRAST  Result Date: 07/27/2021 CLINICAL DATA:  Neuro deficit, acute, stroke suspected EXAM: MRI HEAD WITHOUT CONTRAST MRA HEAD WITHOUT CONTRAST TECHNIQUE: Multiplanar, multi-echo pulse sequences of the brain and surrounding structures were acquired without intravenous contrast. Angiographic images of the Circle of Willis were acquired using MRA technique without intravenous contrast. COMPARISON:  CT head from the same day. MRA August 07, 2005 without report. FINDINGS: MRI  HEAD FINDINGS Brain: Acute perforator infarct in the left basal ganglia and overlying corona radiata. Associated edema without mass effect. Large remote right MCA territory infarct with encephalomalacia and surrounding gliosis. Evidence of some prior hemorrhage along the posterior aspect of this remote infarct. No evidence of acute hemorrhage. Vascular: See below. Skull and upper cervical spine: Normal marrow signal. Sinuses/Orbits: Mild paranasal sinus mucosal thickening. Unremarkable orbits. Other: No sizable mastoid effusions. MRA HEAD FINDINGS Anterior circulation: Bilateral intracranial ICAs are patent. Moderate right paraclinoid ICA stenosis. Subtle apparent filling defect of the right paraclinoid ICA in the region of stenosis is favored to reflect flow artifact given preserved T2 hypointense flow void on MRI. Bilateral MCAs are patent. Severe distal left M1 MCA stenosis. Attenuated distal right MCA vessels in the region of remote infarct. Bilateral ACAs are patent without proximal hemodynamically significant stenosis. Posterior circulation: Only the distal most intradural vertebral arteries are visualized, which are patent. The basilar artery and bilateral posterior cerebral arteries are patent without proximal hemodynamically significant stenosis. Prominent left posterior communicating artery with small or absent left P1 PCA, anatomic variant. Anatomic variants: Detailed above. IMPRESSION: MRI: 1. 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 2. Large remote right MCA territory infarct. MRA: 1. No large vessel occlusion. 2. Severe distal left M1 MCA stenosis. 3. Moderate right paraclinoid ICA stenosis. 4. Attenuated distal right MCA vessels in the region of remote infarct. Electronically  Signed   By: Margaretha Sheffield M.D.   On: 07/27/2021 10:06   MR BRAIN WO CONTRAST  Result Date: 07/27/2021 CLINICAL DATA:  Neuro deficit, acute, stroke suspected EXAM: MRI HEAD WITHOUT  CONTRAST MRA HEAD WITHOUT CONTRAST TECHNIQUE: Multiplanar, multi-echo pulse sequences of the brain and surrounding structures were acquired without intravenous contrast. Angiographic images of the Circle of Willis were acquired using MRA technique without intravenous contrast. COMPARISON:  CT head from the same day. MRA August 07, 2005 without report. 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 Associated edema without mass effect. Large remote right MCA territory infarct with encephalomalacia and surrounding gliosis. Evidence of some prior hemorrhage along the posterior aspect of this remote infarct. No evidence of acute hemorrhage. Vascular: See below. Skull and upper cervical spine: Normal marrow signal. Sinuses/Orbits: Mild paranasal sinus mucosal thickening. Unremarkable orbits. Other: No sizable mastoid effusions. MRA HEAD FINDINGS Anterior circulation: Bilateral intracranial ICAs are patent. Moderate right paraclinoid ICA stenosis. Subtle apparent filling defect of the right paraclinoid ICA in the region of stenosis is favored to reflect flow artifact given preserved T2 hypointense flow void on MRI. Bilateral MCAs are patent. Severe distal left M1 MCA stenosis. Attenuated distal right MCA vessels in the region of remote infarct. Bilateral ACAs are patent without proximal hemodynamically significant stenosis. Posterior circulation: Only the distal most intradural vertebral arteries are visualized, which are patent. The basilar artery and bilateral posterior cerebral arteries are patent without proximal hemodynamically significant stenosis. Prominent left posterior communicating artery with small or absent left P1 PCA, anatomic variant. Anatomic variants: Detailed above. IMPRESSION: MRI: 1. 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 2. Large remote right MCA territory infarct. MRA: 1. No large vessel  occlusion. 2. Severe distal left M1 MCA stenosis. 3. Moderate right paraclinoid ICA stenosis. 4. Attenuated distal right MCA vessels in the region of remote infarct. Electronically Signed   By: Margaretha Sheffield M.D.   On: 07/27/2021 10:06   DG Chest Port 1 View  Result Date: 07/27/2021 CLINICAL DATA:  Fall EXAM: PORTABLE CHEST 1 VIEW COMPARISON:  11/13/2020 FINDINGS: Heart and mediastinal contours are within normal limits. No focal opacities or effusions. No acute bony abnormality. No visible rib fracture or pneumothorax. IMPRESSION: No active disease. Electronically Signed   By: Rolm Baptise M.D.   On: 07/27/2021 03:27   VAS US CAROTID  Result Date: 07/27/2021 Carotid Arterial Duplex Study Patient Name:  William [name]  Date of Exam:   07/27/2021 Medical Rec #: [id number]       Accession #:    [id number] Date of Birth: 08-05-1940       Patient Gender: M Patient Age:   4 years Exam Location:  [facility] Procedure:      VAS US CAROTID Referring Phys: Joseph Berkshire --------------------------------------------------------------------------------  Indications:       CVA, Carotid artery disease and bilateral endarterectomies                    (right 2006, left 2010). Risk Factors:      Hypertension, past history of smoking, prior CVA. Other Factors:     History of bladder cancer. Comparison Study:  Prior Carotid duplex done 01/30/20 indicated 1-39% ICA                    stenosis bilaterally and patent CEAs. Performing Technologist: Sharion Dove RVS  Examination Guidelines:  A complete evaluation includes B-mode imaging, spectral Doppler, color Doppler, and power Doppler as needed of all accessible portions of each vessel. Bilateral testing is considered an integral part of a complete examination. Limited examinations for reoccurring indications may be performed as noted.  Right Carotid Findings: +----------+--------+--------+--------+------------------+--------+             PSV cm/s EDV  cm/s Stenosis Plaque Description Comments  +----------+--------+--------+--------+------------------+--------+  CCA Prox   78       16                                             +----------+--------+--------+--------+------------------+--------+  CCA Distal 94       19                                             +----------+--------+--------+--------+------------------+--------+  ICA Prox   44       11       1-39%    heterogenous                 +----------+--------+--------+--------+------------------+--------+  ICA Distal 67       20                                             +----------+--------+--------+--------+------------------+--------+  ECA        97       22                                             +----------+--------+--------+--------+------------------+--------+ +----------+--------+-------+--------+-------------------+             PSV cm/s EDV cms Describe Arm Pressure (mmHG)  +----------+--------+-------+--------+-------------------+  Subclavian 167                                            +----------+--------+-------+--------+-------------------+ +---------+--------+--+--------+--+  Vertebral PSV cm/s 67 EDV cm/s 20  +---------+--------+--+--------+--+  Left Carotid Findings: +----------+--------+--------+--------+------------------+------------------+             PSV cm/s EDV cm/s Stenosis Plaque Description Comments            +----------+--------+--------+--------+------------------+------------------+  CCA Prox   148      18                                   intimal thickening  +----------+--------+--------+--------+------------------+------------------+  CCA Distal 108      14                                   intimal thickening  +----------+--------+--------+--------+------------------+------------------+  ICA Prox   48       14       1-39%    heterogenous                           +----------+--------+--------+--------+------------------+------------------+  ICA Distal 92       25                                                        +----------+--------+--------+--------+------------------+------------------+  ECA        123      21                                                       +----------+--------+--------+--------+------------------+------------------+ +----------+--------+--------+--------+-------------------+             PSV cm/s EDV cm/s Describe Arm Pressure (mmHG)  +----------+--------+--------+--------+-------------------+  Subclavian 93                                              +----------+--------+--------+--------+-------------------+ +---------+--------+--+--------+--+  Vertebral PSV cm/s 62 EDV cm/s 17  +---------+--------+--+--------+--+   Summary: Right Carotid: Velocities in the right ICA are consistent with a 1-39% stenosis. Left Carotid: There is no evidence of stenosis in the left ICA. Vertebrals:  Bilateral vertebral arteries demonstrate antegrade flow. Subclavians: Normal flow hemodynamics were seen in bilateral subclavian              arteries. *See table(s) above for measurements and observations.     Preliminary    VAS [place] TRANSCRANIAL DOPPLER  Result Date: 07/27/2021  Transcranial Doppler Patient Name:  William [name]  Date of Exam:   07/27/2021 Medical Rec #: [id number]       Accession #:    [id number] Date of Birth: 03/29/1941       Patient Gender: M Patient Age:   64 years Exam Location:  [facility] Procedure:      VAS [place] TRANSCRANIAL DOPPLER Referring Phys: Ruben Mahler --------------------------------------------------------------------------------  Indications: Stroke. Comparison Study: No prior study Performing Technologist: Sharion Dove RVS  Examination Guidelines: A complete evaluation includes B-mode imaging, spectral Doppler, color Doppler, and power Doppler as needed of all accessible portions of each vessel. Bilateral testing is considered an integral part of a complete examination. Limited examinations for reoccurring indications may be  performed as noted.  +----------+-------------+----------+-----------+-------+  RIGHT TCD  Right VM (cm) Depth (cm) Pulsatility Comment  +----------+-------------+----------+-----------+-------+  MCA            51.00                   1.29              +----------+-------------+----------+-----------+-------+  ACA           -36.00                   1.20              +----------+-------------+----------+-----------+-------+  Term ICA       51.00                   1.28              +----------+-------------+----------+-----------+-------+  PCA            25.00  1.14              +----------+-------------+----------+-----------+-------+  Opthalmic      30.00                   1.30              +----------+-------------+----------+-----------+-------+  ICA siphon     22.00                   1.22              +----------+-------------+----------+-----------+-------+  Vertebral      36.00                   1.00              +----------+-------------+----------+-----------+-------+  +----------+------------+----------+-----------+------------------+  LEFT TCD   Left VM (cm) Depth (cm) Pulsatility      Comment        +----------+------------+----------+-----------+------------------+  MCA           101.00       4.30       1.56                         +----------+------------+----------+-----------+------------------+  ACA                                            Unable to insonate  +----------+------------+----------+-----------+------------------+  Term ICA      55.00                   1.51                         +----------+------------+----------+-----------+------------------+  PCA           34.00                   1.16                         +----------+------------+----------+-----------+------------------+  Opthalmic     25.00                   1.26                         +----------+------------+----------+-----------+------------------+  ICA siphon    23.00                   1.58                          +----------+------------+----------+-----------+------------------+  Vertebral     -21.00                  1.52                         +----------+------------+----------+-----------+------------------+  +------------+------+-------+               VM cm  Comment  +------------+------+-------+  Prox Basilar -27.00          +------------+------+-------+    Preliminary    US ABDOMEN LIMITED RUQ (LIVER/GB)  Result Date: 07/27/2021 CLINICAL DATA:  Abdominal pain with elevated LFTs. EXAM: ULTRASOUND ABDOMEN LIMITED RIGHT UPPER QUADRANT COMPARISON:  CT  without contrast 04/28/2021 FINDINGS: Gallbladder: The gallbladder is filled with stones, largest measurable stone approaching 1.5 cm. There is a thickened anterior free wall measuring 5 mm with a positive sonographic Murphy sign. This is concerning for acute cholecystitis. Common bile duct: Diameter: Unable to visualize. Liver: No focal lesion identified. There is increased hepatic echogenicity consistent with steatosis. Portal vein is patent on color Doppler imaging with normal direction of blood flow towards the liver. Other: 5 cm simple appearing cyst again noted in the upper pole right kidney. No free fluid is evident. IMPRESSION: 1. Mild thickening of the gallbladder with positive sonographic Murphy sign and multiple stones. Findings concerning for acute cholecystitis. No pericholecystic fluid. 2. Fatty liver. 3. Unable to visualize the bile ducts due to shadowing by the gallbladder and due to bowel gas. 4. 5 cm simple cyst right kidney. Electronically Signed   By: Telford Nab M.D.   On: 07/27/2021 06:10     PHYSICAL EXAM Temp:  [98.3 F (36.8 C)] 98.3 F (36.8 C) (02/13 0301) Pulse Rate:  [80-108] 80 (02/13 1445) Resp:  [22-27] 22 (02/13 1445) BP: (118-170)/(55-92) 127/62 (02/13 1445) SpO2:  [92 %-100 %] 98 % (02/13 1445)  General - Well nourished, well developed, elderly Caucasian male, lethargic but in no apparent  distress.  Cardiovascular - Regular rhythm and rate on telemetry.  Respiratory - 2L [place] in place with normal saturation   Mental Status -  Patient lethargic but oriented person, city, but not time (2021). Language including expression, naming, repetition, comprehension was assessed and found with dysarthria Fund of Knowledge was unable to be properly assessed  Cranial Nerves II - XII - II - Visual field intact OU. PERRL 26mm/brisk.  III, IV, VI - Extraocular movements with L-gaze preference. Will track past the midline to gaze right but unable to hold gaze. V - Facial sensation intact bilaterally. VII - Facial movement with L facial droop when smiling. VIII - Hearing ntact bilaterally. X - Hypophonic speech XI - Shoulder shrug weak bilaterally. Head preferentially turned to the left XII - Tongue protrusion attempted but unable to fully protrude.  Motor Strength - RUE 4/5 proximally and distally RLE with weak flexion at knee with assist into position; minimally elevates antigravity.  LUE lifts antigravity at elbow 3/5, but not at shoulder which is rated as 2/5. Grip is 3/5.  LLE with weak flexion at knee with assist into position; minimally elevates antigravity. Bulk was normal and fasciculations were absent.   Motor Tone - Muscle tone was assessed at the appendages and was normal.  Sensory - Light touch was assessed and symmetrical in all extremities. Able to note that both legs were being touched.    Coordination - Bilateral tremor was present. FNF unable to be assessed 2/2 BUE weakness.  Gait and Station - deferred.    ASSESSMENT/PLAN William [name] is a 81 y.o. 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 MRI  acute infarct at L basal ganglia and corona radiata as well as remote R MCA infarcts. MRA  severe distal L M1  MCA stenosis and moderate R ICA stenosis.  Carotid Doppler  1-39% stenosis of R ICA and no stenosis of L ICA 2D Echo Read pending TC Doppler: Pending LDL 115 HgbA1c 5.3 VTE prophylaxis - SCDs    Diet   Diet NPO time specified   aspirin 325 mg daily EC prior to admission, now on aspirin 81 mg daily and clopidogrel 75 mg daily. x 3 months then continue Plavix alone Therapy recommendations:  AIR Disposition:  Pending   Hypertension Home meds:  Norvasc 10 mg, Lisinopril-HCTZ 10-12.5 mg Stable Goal SBP <160 but gradually normalize in 5-7 days Long-term BP goal normotensive  Hyperlipidemia Home meds:  Fenofibrate 160 mg, resumed in hospital LDL 115, goal < 70 Patient with statin allergy --> myalgia. Will discuss the possibility of initiating statin with Co-Q10  Diabetes type II Assessment Home meds:  N/a HgbA1c 5.3, goal < 7.0 CBGs No results for input(s): GLUCAP in the last 72 hours.  SSI  Other Stroke Risk Factors Advanced Age >/= 72  Former cigarette smoker; quit 16 years ago ETOH use, alcohol level <10, advised to drink no more than 1-2 drink(s) a day Substance abuse - UDS:  THC NONE DETECTED, Cocaine NONE DETECTED. Patient advised to stop using due to stroke risk. Obesity, There is no height or weight on file to calculate BMI., BMI >/= 30 associated with increased stroke risk, recommend weight loss, diet and exercise as appropriate  Hx stroke/TIA Coronary artery disease  Other Active Problems   Hospital day # 0    Rosezetta Schlatter, MD Stroke Neurology- Neuro Psych Resident 07/27/2021 2:51 PM  STROKE MD NOTE : I have personally obtained history,examined this patient, reviewed notes,  independently viewed imaging studies, participated in medical decision making and plan of care.ROS completed by me personally and pertinent positives fully documented  I have made any additions or clarifications directly to the above note. Agree with note above.  Patient with mild dementia at baseline and previous strokes presents with acute large left basal ganglia and subcortical infarct as well as prior history of remote right MCA infarcts.  His strokes are likely related to document stenosis with the patient is not a candidate for aggressive intervention with angioplasty stenting or monitoring for A-fib with is not a good long-term anticoagulation candidate given his poor baseline functioning and cognitive impairment multiple medical comorbidities.  Recommend aspirin and Plavix for 3 months followed by aspirin alone and aggressive risk factor modification.  Continue ongoing stroke work-up.  Long discussion with patient and family at the bedside and answered questions about his care. 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 Antony Contras, MD Medical Director [place] Pager: [phone] 07/28/2021 5:46 PM    To contact Stroke Continuity provider, please refer to [email or web address] After hours, contact General Neurology

## 2021-07-27 NOTE — ED Triage Notes (Signed)
Pt came in from home via EMS with c/o vomiting and syncopal episode. Pt had one episode of coffee ground emesis. Pt is not on blood thinners. A+O times three. Per wife this is his baseline. Pt did hit his head when he passed out.

## 2021-07-27 NOTE — Progress Notes (Signed)
PT Cancellation Note  Patient Details Name: William [name] MRN: [id number] DOB: [birth year]   Cancelled Treatment:    Reason Eval/Treat Not Completed: Patient at procedure or test/unavailable. Will follow up as schedule allows.   Jonne Ply, SPT  [place] [name] 07/27/2021, 10:38 AM

## 2021-07-27 NOTE — Evaluation (Signed)
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/27/2021   Cholelithiasis 07/27/2021   Hypoxia 07/27/2021   Nausea and vomiting 63/78/5885   Acute metabolic encephalopathy 02/77/4128   Normocytic anemia 07/27/2021   Chronic back pain 07/27/2021   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[place]) 06/22/2021   Closed fracture of first lumbar vertebra with routine healing 02/03/2021   Closed fracture of multiple ribs 11/18/2020   Anxiety 01/29/2020   Leg pain, bilateral 01/29/2020   Ingrown toenail 07/13/2019   Lumbar spondylosis 05/02/2018   Pain in left knee 03/09/2018   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   Trochanteric bursitis of left hip 01/16/2018   Preventative health care 09/26/2017   HTN (hypertension) 07/19/2015   Hyperlipidemia 07/19/2015   Great toe pain 02/11/2014   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   Obesity (BMI 30-39.9) 06/25/2013   Renal insufficiency 06/25/2013   Weakness of left arm 06/25/2013   Sebaceous cyst 03/03/2011   Sprain of lumbar region 07/31/2010   Rib pain, left 08/29/2009   Carotid artery stenosis, asymptomatic, bilateral 05/02/2009   Eczema, atopic 05/31/2008   Vitamin D deficiency 03/01/2008   BPH (benign prostatic hyperplasia) 08/06/2007   Fasting hyperglycemia 12/21/2006   History of right MCA infarct 2006   Past Medical History:  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   Mohs surgery jan 2016     Bladder stone    BPH (benign prostatic hyperplasia) 08/06/2007   Carotid artery occlusion    Chronic kidney disease 2014   Stage III   Closed fracture of fifth metacarpal bone 05/15/2015   Eczema    Fasting hyperglycemia 12/21/2006   GERD (gastroesophageal reflux disease)    History of carotid artery stenosis    S/P BILATERAL CEA   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   HTN (hypertension) 07/19/2015   Hyperlipidemia    Hypertension    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   Mild, related to stroke history   Nocturia    Renal insufficiency 06/25/2013   S/P carotid endarterectomy    BILATERAL ICA--  PATENT PER DUPLEX  05-19-2012   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   Right calf   Urinary frequency    Vitamin D deficiency    Past Surgical History:  Past Surgical History:  Procedure Laterality Date   APPENDECTOMY  AS CHILD   CARDIOVASCULAR STRESS TEST  03-27-2012  DR CRENSHAW   LOW RISK LEXISCAN STUDY-- PROBABLE NORMAL PERFUSION AND SOFT TISSUE ATTENUATION/  NO ISCHEMIA/ EF 51%   CAROTID ENDARTERECTOMY Bilateral LEFT  11-12-2008  DR GREG HAYES   RIGHT ICA  2006  ([facility])   CYSTOSCOPY W/ RETROGRADES Bilateral 06/22/2021   Procedure: CYSTOSCOPY WITH RETROGRADE PYELOGRAM;  Surgeon: Franchot Gallo, MD;  Location: [facility];  Service: Urology;  Laterality: Bilateral;   CYSTOSCOPY WITH LITHOLAPAXY N/A 02/26/2013   Procedure: CYSTOSCOPY WITH LITHOLAPAXY;  Surgeon: Franchot Gallo, MD;  Location: [facility];  Service: Urology;  Laterality: N/A;   EYE SURGERY  Jan. 2016   cataract surgery both eyes   INGUINAL HERNIA REPAIR Right 11-08-2006   IR KYPHO EA ADDL LEVEL THORACIC OR LUMBAR  02/12/2021   IR RADIOLOGIST EVAL & MGMT  02/18/2021   MASS EXCISION N/A 03/03/2016   Procedure: EXCISION OF BACK  MASS;  Surgeon: Stark Klein, MD;  Location: [place];  Service: General;  Laterality: N/A;    MOHS SURGERY Left 1/ 2016   Dr Nevada Crane-- Basal cell   PROSTATE SURGERY     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-C N/A 06/22/2021   Procedure: TRANSURETHRAL RESECTION OF BLADDER TUMOR;  Surgeon: Franchot Gallo, MD;  Location[facility];  Service: Urology;  Laterality: N/A;   TRANSURETHRAL RESECTION OF PROSTATE N/A 02/26/2013   Procedure: TRANSURETHRAL RESECTION OF THE PROSTATE WITH GYRUS INSTRUMENTS;  Surgeon: Franchot Gallo, MD;  Location: [facility];  Service: Urology;  Laterality: N/A;   TRANSURETHRAL RESECTION OF PROSTATE N/A 06/22/2021   Procedure: TRANSURETHRAL RESECTION OF THE PROSTATE (TURP);  Surgeon: Franchot Gallo, MD;  Location: [facility];  Service: Urology;  Laterality: N/A;   HPI:  William [name] is a 81 y/o male presenting on 07/27/21 with AMS, slurred speech, facial droop, syncope, and difficulty walking. 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  There were occasional phonemic paraphasias.  Verbal and motor output was characterized by intermittent perseveratory response patterns, requiring cues to cease or shift output.  Speech was low in volume but no longer dysarthric.  Family was at bedside - we discussed aphasia and plan for therapy, including the possibility of an an acute inpatient rehab admission. SLP will follow while admitted in acute care - William [name] is an excellent candidate for AIR.    SLP Assessment  SLP Recommendation/Assessment: Patient needs continued Speech [place] [facility] Services SLP Visit Diagnosis: Cognitive  communication deficit (R41.841)    Recommendations for follow up therapy are one component of a multi-disciplinary discharge planning process, led by the attending physician.  Recommendations may be updated based on patient status, additional functional criteria and insurance authorization.    Follow Up Recommendations  Acute inpatient rehab (3hours/day)    Assistance Recommended at Discharge     Functional Status Assessment Patient has had a recent decline in their functional status and demonstrates the ability to make significant improvements in function in a reasonable and predictable amount of time.  Frequency and Duration min 2x/week  2 weeks      SLP Evaluation Cognition  Overall Cognitive Status: Impaired/Different from baseline       Comprehension  Auditory Comprehension Overall Auditory Comprehension: Impaired Commands: Impaired One Step Basic Commands: 75-100% accurate Two Step Basic Commands: 50-74% accurate Visual Recognition/Discrimination Discrimination: Not tested Reading Comprehension Reading Status: Not tested    Expression Expression Primary Mode of Expression: Verbal Verbal Expression Overall Verbal Expression: Impaired Initiation: No impairment Automatic Speech: Day of week;Month of year Level of Generative/Spontaneous Verbalization: Sentence Repetition: No impairment Naming: Impairment Responsive: 76-100% accurate Confrontation: Within functional limits Divergent: 0-24% accurate Verbal Errors: Phonemic paraphasias;Perseveration Written Expression Written Expression: Not tested  Oral / Motor  Oral Motor/Sensory Function Overall Oral Motor/Sensory Function: Mild impairment Facial Symmetry: Abnormal symmetry left;Suspected CN VII (facial) dysfunction Motor Speech Overall Motor Speech: Appears within functional limits for tasks assessed            [name] 07/27/2021, 5:52 PM Estill Bamberg L. Tivis Ringer, [place] Office number [phone] Pager [phone]

## 2021-07-27 NOTE — Assessment & Plan Note (Addendum)
Patient was on aspirin daily and fenofibrate.

## 2021-07-27 NOTE — Consult Note (Signed)
William [name] [birth year]  [id number].    Requesting MD: Dr. Fuller Plan Chief Complaint/Reason for Consult: Possible Acute Cholecystitis   HPI: William [name] is a 81 y.o. male with a hx of HTN, HLD, prior CVA w/ residual left sided hemiplegia (2006), carotid artery stenosis s/p b/l CEA, CKD  stage IV, remote hx of bladder cancer s/p TURP and GERD who presented after a fall and n/v.   Patient seen here 2/8 with AMS. He underwent extensive workup including CT and MRI brain that did not show any acute abnormalities. He was dx w/ UTI and tx w/ Cefdinir. Represented to [name] 2/9 w/ AMS and slurred speech which was felt to be 2/2 UTI. Ucx negative.  This morning the patient began having multiple episodes of nausea and vomiting.  After wife reports that he fell in the bathroom.  Unknown head trauma or loss of consciousness.  She is unsure if he slipped on water that was on the ground or if he lost his balance.   Wife reports over the last several weeks he has been complaining of "indigestion" but no abdominal pain after eating.  He has been trying Pepcid and Tums for this with some relief.  She reports his symptoms have become more frequent and occur after almost every meal.  No associated nausea and vomiting until this morning.  He currently denies any current abdominal pain.  No fevers at home.  ED workup showed an acute infarct of the left basal ganglia and corona radiata on CTH. Neurology was consulted and [facility] admitting. Also found to have cholelithiasis (largest stone measuring 1.5cm) with mild thickening of the gallbladder (23mm) and positive Murphy's sign concerning for Acute Cholecystitis. WBC 9.8. Lipase 59. AST 226, ALT 115, Alk phos 122, T. Bili 1.2. We were asked to see.   Blood thinners: 325 ASA (last dose yesterday) Abdominal surgical Hx: Appendectomy, R inguinal hernia repair, TURP Tobacco Use: Prior, quit in 2006 Alcohol use: Occasional, no recent alcohol use Retired. Lives at  home with his wife and dog  ROS: Review of Systems  Unable to perform ROS: Mental status change  Constitutional:  Negative for fever.  Respiratory:  Negative for shortness of breath.   Cardiovascular:  Negative for chest pain.  Gastrointestinal:  Positive for abdominal pain (indigestion but denies "pain"), nausea and vomiting. Negative for constipation.   Family History  Problem Relation Age of Onset   Heart disease Mother        CHF   Bipolar disorder Mother    Heart disease Father        CHF    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   Mild, related to stroke history   Nocturia    Renal insufficiency 06/25/2013   S/P carotid endarterectomy    BILATERAL ICA--  PATENT PER DUPLEX  05-19-2012   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   Right calf   Urinary frequency    Vitamin D deficiency     Past Surgical History:  Procedure Laterality Date   APPENDECTOMY  AS CHILD   CARDIOVASCULAR STRESS TEST  03-27-2012  DR CRENSHAW   LOW RISK LEXISCAN STUDY-- PROBABLE NORMAL PERFUSION AND SOFT TISSUE ATTENUATION/  NO ISCHEMIA/ EF 51%   CAROTID ENDARTERECTOMY Bilateral LEFT  11-12-2008  DR GREG HAYES   RIGHT ICA  2006  ([facility])   CYSTOSCOPY W/ RETROGRADES Bilateral 06/22/2021   Procedure: CYSTOSCOPY WITH RETROGRADE PYELOGRAM;  Surgeon: Marcine Matar, MD;  Location:  [facility];  Service: Urology;  Laterality: Bilateral;   CYSTOSCOPY WITH LITHOLAPAXY N/A 02/26/2013   Procedure: CYSTOSCOPY WITH LITHOLAPAXY;  Surgeon: Marcine Matar, MD;  Location: [facility];  Service: Urology;  Laterality: N/A;   EYE SURGERY  Jan. 2016   cataract surgery both eyes   INGUINAL HERNIA REPAIR Right 11-08-2006   IR KYPHO EA ADDL LEVEL THORACIC OR LUMBAR  02/12/2021   IR RADIOLOGIST EVAL & MGMT  02/18/2021   MASS EXCISION N/A 03/03/2016   Procedure: EXCISION OF BACK  MASS;  Surgeon: Almond Lint, MD;  Location: [facility];  Service: General;  Laterality: N/A;   MOHS SURGERY Left 1/ 2016   Dr Margo Aye-- Basal cell   PROSTATE SURGERY     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-C N/A 06/22/2021   Procedure: TRANSURETHRAL RESECTION OF BLADDER TUMOR;  Surgeon: Marcine Matar, MD;  Location: [facility];  Service: Urology;  Laterality: N/A;   TRANSURETHRAL RESECTION OF PROSTATE N/A 02/26/2013   Procedure: TRANSURETHRAL RESECTION OF THE PROSTATE WITH GYRUS INSTRUMENTS;  Surgeon: Marcine Matar, MD;  Location: [facility];  Service: Urology;  Laterality: N/A;   TRANSURETHRAL RESECTION OF PROSTATE N/A 06/22/2021   Procedure: TRANSURETHRAL RESECTION OF THE PROSTATE (TURP);  Surgeon: Marcine Matar, MD;  [facility]  Service: Urology;  Laterality: N/A;    Social History:  reports that he quit smoking about 16 years ago. His smoking use included cigarettes. He has a 80.00 pack-year smoking history. He has never used smokeless tobacco. He reports current alcohol use. He reports that he does not use drugs.  Allergies:  Allergies  Allergen Reactions   Strawberry Extract Hives   Latex Itching   Adhesive [Tape] Other (See Comments)    BLISTER   Statins Other (See Comments)    myalgias    (Not in a hospital admission)    Physical Exam: Blood pressure (!) 160/92,  pulse (!) 101, temperature 98.3 F (36.8 C), temperature source Oral, resp. rate (!) 26, SpO2 100 %. General: pleasant, WD/WN white male who is laying in bed in NAD HEENT: Sclera are noninjected.  PERRL.  Ears and nose without any masses or lesions.  Mouth is pink and moist. Dentition fair Heart: Tachycardic with reg rhythm.  Normal s1,s2. No obvious murmurs, gallops, or rubs noted.  Palpable pedal pulses bilaterally  Lungs: CTAB, no wheezes, rhonchi, or rales noted.  Respiratory effort nonlabored Abd: Soft, mildly distended NT on exam, negative murphy's, small contusion/abrasion to lower abdomen, +BS, no masses, hernias, or organomegaly MS: no BUE/BLE edema, calves soft and nontender Skin: warm and dry with no masses, lesions, or rashes Psych: A&Ox2 (self and place). Appears somewhat confused Neuro: Moves all extremities, gait not assessed  Results for orders placed or performed during the hospital encounter of 07/27/21 (from the past 48 hour(s))  CBC with Differential/Platelet     Status: Abnormal   Collection Time: 07/27/21  3:07 AM  Result Value Ref Range   WBC 9.8 4.0 - 10.5 K/uL   RBC 3.51 (L) 4.22 - 5.81 MIL/uL   Hemoglobin 11.5 (L) 13.0 - 17.0 g/dL   HCT 34.5 (L) 39.0 - 52.0 %   MCV 98.3 80.0 - 100.0 fL   MCH 32.8 26.0 - 34.0 pg   MCHC 33.3 30.0 - 36.0 g/dL   RDW 13.4 11.5 - 15.5 %   Platelets 244 150 - 400 K/uL   nRBC 0.0 0.0 - 0.2 %   Neutrophils Relative % 80 %   Neutro Abs 7.9 (H) 1.7 - 7.7 K/uL   Lymphocytes Relative 11 %   Lymphs Abs 1.1 0.7 - 4.0 K/uL   Monocytes Relative 7 %   Monocytes Absolute 0.7 0.1 - 1.0 K/uL   Eosinophils Relative 1 %   Eosinophils Absolute 0.1 0.0 - 0.5 K/uL   Basophils Relative 0 %   Basophils Absolute 0.0 0.0 - 0.1 K/uL   Immature Granulocytes 1 %   Abs Immature Granulocytes 0.08 (H) 0.00 - 0.07 K/uL    Comment: Performed at [facility], [address], [place], [place] [address]  Comprehensive metabolic panel     Status:  Abnormal   Collection Time: 07/27/21  3:07 AM  Result Value Ref Range   Sodium 141 135 - 145 mmol/L   Potassium 4.2 3.5 - 5.1 mmol/L   Chloride 106 98 - 111 mmol/L   CO2 24 22 - 32 mmol/L   Glucose, Bld 213 (H) 70 - 99 mg/dL    Comment: Glucose reference range applies only to samples taken after fasting for at least 8 hours.   BUN 32 (H) 8 - 23 mg/dL   Creatinine, Ser 2.47 (H) 0.61 - 1.24 mg/dL   Calcium 9.1 8.9 - 10.3 mg/dL   Total Protein 6.9 6.5 - 8.1 g/dL   Albumin 3.3 (L) 3.5 - 5.0 g/dL   AST 226 (H) 15 - 41 U/L   ALT 115 (H) 0 - 44 U/L   Alkaline Phosphatase 122 38 - 126 U/L   Total Bilirubin 1.2 0.3 - 1.2 mg/dL   GFR, Estimated 26 (L) >60 mL/min    Comment: (NOTE) Calculated using the CKD-EPI Creatinine Equation (2021)    Anion gap 11 5 - 15    Comment: Performed at [facility], [place] [address] [place], [place] [address]  Protime-INR     Status: None   Collection Time: 07/27/21  3:07 AM  Result Value Ref Range   Prothrombin Time 12.7 11.4 - 15.2 seconds   INR 1.0 0.8 - 1.2    Comment: (NOTE) INR goal varies based on device and disease states. Performed at [facility], [place] [address] [place], [name] [address]   Troponin I (High Sensitivity)     Status: None   Collection Time: 07/27/21  3:07 AM  Result Value Ref Range   Troponin I (High Sensitivity) 15 <18 ng/L    Comment: (NOTE) Elevated high sensitivity troponin I (hsTnI) values and significant  changes across serial measurements may suggest ACS but many other  chronic and acute conditions are known to elevate hsTnI results.  Refer to the "Links" section for chest pain algorithms and additional  guidance. Performed at [facility],  [address], [place], [place] [address]   Lactic acid, plasma     Status: None   Collection Time: 07/27/21  3:07 AM  Result Value Ref Range   Lactic Acid, Venous 1.8 0.5 - 1.9 mmol/L    Comment: Performed at [facility], [address],  [place], [place] [address]  Lipase, blood     Status: Abnormal   Collection Time: 07/27/21  3:07 AM  Result Value Ref Range   Lipase 59 (H) 11 - 51 U/L    Comment: Performed at [facility], [address], [place], [place] [address]  Type and screen     Status: None   Collection Time: 07/27/21  3:07 AM  Result Value Ref Range   ABO/RH(D) A POS    Antibody Screen NEG    Sample Expiration      07/30/2021,2359 Performed at [facility], [address], [place], [place] [address]   Urinalysis, Routine w reflex microscopic Urine, Clean Catch     Status: Abnormal   Collection Time: 07/27/21  3:26 AM  Result Value Ref Range   Color, Urine YELLOW YELLOW   APPearance CLEAR CLEAR   Specific Gravity, Urine 1.017 1.005 - 1.030   pH 6.0 5.0 - 8.0   Glucose, UA NEGATIVE NEGATIVE mg/dL   Hgb urine dipstick NEGATIVE NEGATIVE   Bilirubin Urine NEGATIVE NEGATIVE   Ketones, ur 5 (A) NEGATIVE mg/dL   Protein, ur 30 (A) NEGATIVE mg/dL   Nitrite NEGATIVE NEGATIVE   Leukocytes,Ua MODERATE (A) NEGATIVE   RBC / HPF 11-20 0 - 5 RBC/hpf   WBC, UA 21-50 0 - 5 WBC/hpf   Bacteria, UA RARE (A) NONE SEEN   Squamous Epithelial / LPF 0-5 0 - 5   Mucus PRESENT     Comment: Performed at [facility], [address], [place], [place] [address]  Resp Panel by RT-PCR (Flu A&B, Covid) Nasopharyngeal Swab     Status: None   Collection Time: 07/27/21  3:34 AM   Specimen: Nasopharyngeal Swab; Nasopharyngeal(NP) swabs in vial transport medium  Result Value Ref Range   SARS Coronavirus 2 by RT PCR NEGATIVE NEGATIVE    Comment: (NOTE) SARS-CoV-2 target nucleic acids are NOT DETECTED.  The SARS-CoV-2 RNA is generally detectable in upper respiratory specimens during the acute phase of infection. The lowest concentration of SARS-CoV-2 viral copies this assay can detect is 138 copies/mL. A negative result does not preclude SARS-Cov-2 infection and should not be used as the sole basis for treatment  or other patient management decisions. A negative result may occur with  improper specimen collection/handling, submission of specimen other than nasopharyngeal swab, presence of viral mutation(s) within the areas targeted by this assay, and inadequate number of viral copies(<138 copies/mL). A negative result must be combined with clinical observations, patient history, and epidemiological information. The expected result is Negative.  Fact Sheet for Patients:  BloggerCourse.com  Fact Sheet for Healthcare Providers:  SeriousBroker.it  This test is no t yet approved or cleared by the [place] FDA and  has been authorized for detection and/or diagnosis of SARS-CoV-2 by FDA under an Emergency Use Authorization (EUA). This EUA will remain  in effect (meaning this test can be used) for the duration of the COVID-19 declaration under Section 564(b)(1) of the Act, 21 U.S.C.section 360bbb-3(b)(1), unless the authorization is terminated  or revoked sooner.       Influenza A by PCR NEGATIVE NEGATIVE  Influenza B by PCR NEGATIVE NEGATIVE    Comment: (NOTE) The Xpert Xpress SARS-CoV-2/FLU/RSV plus assay is intended as an aid in the diagnosis of influenza from Nasopharyngeal swab specimens and should not be used as a sole basis for treatment. Nasal washings and aspirates are unacceptable for Xpert Xpress SARS-CoV-2/FLU/RSV testing.  Fact Sheet for Patients: EntrepreneurPulse.com.au  Fact Sheet for Healthcare Providers: IncredibleEmployment.be  This test is not yet approved or cleared by the [place] FDA and has been authorized for detection and/or diagnosis of SARS-CoV-2 by FDA under an Emergency Use Authorization (EUA). This EUA will remain in effect (meaning this test can be used) for the duration of the COVID-19 declaration under Section 564(b)(1) of the Act, 21 U.S.C. section  360bbb-3(b)(1), unless the authorization is terminated or revoked.  Performed at [facility], [place] [address], [place], [place] [address]   POC occult blood, ED     Status: None   Collection Time: 07/27/21  4:30 AM  Result Value Ref Range   Fecal Occult Bld NEGATIVE NEGATIVE   CT HEAD WO CONTRAST (5MM)  Result Date: 07/27/2021 CLINICAL DATA:  Minor head trauma.  Vomiting and syncope EXAM: CT HEAD WITHOUT CONTRAST CT CERVICAL SPINE WITHOUT CONTRAST TECHNIQUE: Multidetector CT imaging of the head and cervical spine was performed following the standard protocol without intravenous contrast. Multiplanar CT image reconstructions of the cervical spine were also generated. RADIATION DOSE REDUCTION: This exam was performed according to the departmental dose-optimization program which includes automated exposure control, adjustment of the mA and/or kV according to patient size and/or use of iterative reconstruction technique. COMPARISON:  Brain MRI from 5 days ago FINDINGS: CT HEAD FINDINGS Brain: Interval and acute appearing infarct at the left stratum and corona radiata. Large remote right MCA territory infarct affecting the upper division. Brain atrophy. No acute hemorrhage or hydrocephalus Vascular: No hyperdense vessel or unexpected calcification. Skull: Normal. Negative for fracture or focal lesion. Sinuses/Orbits: No acute finding. CT CERVICAL SPINE FINDINGS Alignment: No traumatic malalignment Skull base and vertebrae: No acute fracture Soft tissues and spinal canal: No prevertebral fluid or swelling. No visible canal hematoma. Disc levels:  Ordinary degenerative changes. 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 These results were called by telephone at the time of interpretation on 07/27/2021 at 4:17 am to provider [facility] , who verbally acknowledged these results. IMPRESSION: 1. 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  2. No posttraumatic intracranial or cervical spine finding. 3. Remote right MCA upper division infarcts. Electronically Signed   By: Jorje Guild M.D.   On: 07/27/2021 04:17   CT CERVICAL SPINE WO CONTRAST  Result Date: 07/27/2021 CLINICAL DATA:  Minor head trauma.  Vomiting and syncope EXAM: CT HEAD WITHOUT CONTRAST CT CERVICAL SPINE WITHOUT CONTRAST TECHNIQUE: Multidetector CT imaging of the head and cervical spine was performed following the standard protocol without intravenous contrast. Multiplanar CT image reconstructions of the cervical spine were also generated. RADIATION DOSE REDUCTION: This exam was performed according to the departmental dose-optimization program which includes automated exposure control, adjustment of the mA and/or kV according to patient size and/or use of iterative reconstruction technique. COMPARISON:  Brain MRI from 5 days ago FINDINGS: CT HEAD FINDINGS Brain: Interval and acute appearing infarct at the left stratum and corona radiata. Large remote right MCA territory infarct affecting the upper division. Brain atrophy. No acute hemorrhage or hydrocephalus Vascular: No hyperdense vessel or unexpected calcification. Skull: Normal. Negative for fracture  or focal lesion. Sinuses/Orbits: No acute finding. CT CERVICAL SPINE FINDINGS Alignment: No traumatic malalignment Skull base and vertebrae: No acute fracture Soft tissues and spinal canal: No prevertebral fluid or swelling. No visible canal hematoma. Disc levels:  Ordinary degenerative changes. 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 These results were called by telephone at the time of interpretation on 07/27/2021 at 4:17 am to provider [facility] , who verbally acknowledged these results. IMPRESSION: 1. 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 2. No posttraumatic intracranial or cervical spine finding. 3. Remote right MCA upper division infarcts.  Electronically Signed   By: Jorje Guild M.D.   On: 07/27/2021 04:17   DG Chest Port 1 View  Result Date: 07/27/2021 CLINICAL DATA:  Fall EXAM: PORTABLE CHEST 1 VIEW COMPARISON:  11/13/2020 FINDINGS: Heart and mediastinal contours are within normal limits. No focal opacities or effusions. No acute bony abnormality. No visible rib fracture or pneumothorax. IMPRESSION: No active disease. Electronically Signed   By: Rolm Baptise M.D.   On: 07/27/2021 03:27   US ABDOMEN LIMITED RUQ (LIVER/GB)  Result Date: 07/27/2021 CLINICAL DATA:  Abdominal pain with elevated LFTs. EXAM: ULTRASOUND ABDOMEN LIMITED RIGHT UPPER QUADRANT COMPARISON:  CT without contrast 04/28/2021 FINDINGS: Gallbladder: The gallbladder is filled with stones, largest measurable stone approaching 1.5 cm. There is a thickened anterior free wall measuring 5 mm with a positive sonographic Murphy sign. This is concerning for acute cholecystitis. Common bile duct: Diameter: Unable to visualize. Liver: No focal lesion identified. There is increased hepatic echogenicity consistent with steatosis. Portal vein is patent on color Doppler imaging with normal direction of blood flow towards the liver. Other: 5 cm simple appearing cyst again noted in the upper pole right kidney. No free fluid is evident. IMPRESSION: 1. Mild thickening of the gallbladder with positive sonographic Murphy sign and multiple stones. Findings concerning for acute cholecystitis. No pericholecystic fluid. 2. Fatty liver. 3. Unable to visualize the bile ducts due to shadowing by the gallbladder and due to bowel gas. 4. 5 cm simple cyst right kidney. Electronically Signed   By: Telford Nab M.D.   On: 07/27/2021 06:10    Anti-infectives (From admission, onward)    Start     Dose/Rate Route Frequency Ordered Stop   07/27/21 0730  piperacillin-tazobactam (ZOSYN) IVPB 3.375 g        3.375 g 100 mL/hr over 30 Minutes Intravenous  Once 07/27/21 0720          Assessment/Plan Cholelithiasis with possible Acute Cholecystitis Elevated LFT's - Patient has been having indigestion after eating over the last several weeks after PO intake. N/v this AM. RUQ [place] questions Acute Cholecystitis and LFT's are mildly elevated. He is NT on my exam. Would recommend HIDA scan. If this is positive, would recommend Perc Chole drain by IR. I do not think he is a good surgical candidate during admission in the setting of new CVA on today's imaging. Reasonable to cover with abx until HIDA is completed. Trend LFT's. Please keep NPO  FEN - NPO, IVF per TRH VTE - SCDs, okay for chemical prophylaxis  ID - Zosyn  Acute infarct of the left basal ganglia and corona radiata - Per Neuro/TRH Prior CVA w/ residual left sided hemiplegia (2006) HTN HLD. Carotid artery stenosis s/p b/l CEA CKD stage IV Remote hx of bladder cancer s/p TURP  GERD   I reviewed nursing notes, ED provider notes, Consultant notes, last 24 h  vitals and pain scores, last 24 h labs and trends, and last 24 h imaging results.  This care required high  level of medical decision making.   Jillyn Ledger, [facility] Surgery 07/27/2021, 7:40 AM Please see Amion for pager number during day hours 7:00am-4:30pm

## 2021-07-27 NOTE — Assessment & Plan Note (Addendum)
Acute on chronic.  On admission AST 226 and ALT 115.  Records note ALT has been mildly elevated intermittently previously in the past.  [place] of gallbladder showed  mild gallbladder wall thickening and multiple gallstones present with poor visualization of the common bile duct. HIDA scan ruled out cholecystitis.  General surgery signed off.  Liver enzymes improving.  He does not have any right upper quadrant tenderness.  We recommend to follow-up with general surgery for cholelithiasis as an outpatient

## 2021-07-27 NOTE — Progress Notes (Signed)
VASCULAR LAB    TCD has been performed.  See CV proc for preliminary results.   Thomasena Vandenheuvel, RVT 07/27/2021, 11:09 AM

## 2021-07-27 NOTE — Assessment & Plan Note (Signed)
Patient with a prior history of carotid artery stenosis status post bilateral carotid artery endarterectomies in 2010.

## 2021-07-27 NOTE — Progress Notes (Signed)
EEG complete - results pending 

## 2021-07-27 NOTE — Assessment & Plan Note (Addendum)
Home medication regimen included fenofibrate 160 mg daily.  He has a prior history of intolerance to statins. Lipid panel showed LDL of 125

## 2021-07-27 NOTE — Assessment & Plan Note (Addendum)
On admission creatinine 2.47 with BUN 32. Creatinine as of this year has ranged from 2.2-2.4. -Hold possible nephrotoxic agents -Continue to monitor kidney function

## 2021-07-27 NOTE — Assessment & Plan Note (Addendum)
Normally patient is alert and oriented and able to follow commands.  However over the last 5 days wife notes that he had been more lethargic and was confused not able to really follow commands.  This is most likely secondary to stroke.  EEG did not show any seizure

## 2021-07-27 NOTE — Progress Notes (Signed)
Inpatient Rehab Admissions Coordinator Note:   Per therapy recommendations patient was screened for CIR candidacy by Michel Santee, PT. At this time, pt appears to be a potential candidate for CIR. I will place an order for rehab consult for full assessment, per our protocol.  Please contact me any with questions.Shann Medal, PT, DPT [phone] 07/27/21 2:45 PM

## 2021-07-27 NOTE — ED Notes (Signed)
Per nuclear med, pt has to be on strict NPO until 1100-1200 for HIDA scan. Will enforce strict NPO until then.

## 2021-07-27 NOTE — Assessment & Plan Note (Addendum)
Ultrasound noted multiple gallstones up to 1.5 cm with mild gallbladder wall thickening with positive sonographic Murphy sign.  Gallbladder duct was poorly seen.  General surgery has been formally consulted and patient was started on empiric antibiotics of Zosyn.  General surgery evaluated and recommended HIDA scan which ruled out cholecystitis.  Antibiotics discontinued.  Will recommend to follow-up with general surgery as an outpatient for cholelithiasis

## 2021-07-27 NOTE — Assessment & Plan Note (Addendum)
Patient presents after having a fall with reports altered mental status and slurred speech.  Imaging including MRI from 2/8 had not noted any signs of stroke.  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  Neurology has been consulted.  He was not a tPA candidate as unknown time of onset.  Hemoglobin A1c was 5.3.LDL of 125 -Echo showed EF of 60 to 77%, grade 1 diastolic dysfunction, no intracardiac source of emboli. -Ultrasound of the carotid arteries did not show any significant stenosis -EEG did not show any seizure activity -He remains intermittently confused.  Oriented to place only.  Obeys commands. -PT/OT/Speech consulted, recommended CIR on discharge -Continue aspirin from home, Plavix added as patient had failed monotherapy  -He has residual weakness on the left side from previous right-sided stroke.  Currently has generalized weakness -Appreciate neurology consultative services, will follow-up for any further recommendations

## 2021-07-27 NOTE — Assessment & Plan Note (Addendum)
Acute.  Normally patient not on oxygen at baseline O2 saturation documented as low as 73%, for which patient was placed on 2 L of nasal cannula oxygen.  Chest x-ray was otherwise noted to be clear and patient denies any complaints of shortness of breath.  Monitor on room air

## 2021-07-27 NOTE — ED Notes (Signed)
Patient transported to CT 

## 2021-07-28 LAB — URINE CULTURE: Culture: NO GROWTH

## 2021-07-28 LAB — COMPREHENSIVE METABOLIC PANEL
ALT: 101 U/L — ABNORMAL HIGH (ref 0–44)
AST: 92 U/L — ABNORMAL HIGH (ref 15–41)
Albumin: 2.8 g/dL — ABNORMAL LOW (ref 3.5–5.0)
Alkaline Phosphatase: 85 U/L (ref 38–126)
Anion gap: 9 (ref 5–15)
BUN: 33 mg/dL — ABNORMAL HIGH (ref 8–23)
CO2: 22 mmol/L (ref 22–32)
Calcium: 9 mg/dL (ref 8.9–10.3)
Chloride: 110 mmol/L (ref 98–111)
Creatinine, Ser: 2.5 mg/dL — ABNORMAL HIGH (ref 0.61–1.24)
GFR, Estimated: 25 mL/min — ABNORMAL LOW (ref >60)
Glucose, Bld: 117 mg/dL — ABNORMAL HIGH (ref 70–99)
Potassium: 4 mmol/L (ref 3.5–5.1)
Sodium: 141 mmol/L (ref 135–145)
Total Bilirubin: 0.7 mg/dL (ref 0.3–1.2)
Total Protein: 6 g/dL — ABNORMAL LOW (ref 6.5–8.1)

## 2021-07-28 LAB — CBC
HCT: 28.8 % — ABNORMAL LOW (ref 39.0–52.0)
Hemoglobin: 9.4 g/dL — ABNORMAL LOW (ref 13.0–17.0)
MCH: 32.4 pg (ref 26.0–34.0)
MCHC: 32.6 g/dL (ref 30.0–36.0)
MCV: 99.3 fL (ref 80.0–100.0)
Platelets: 187 10*3/uL (ref 150–400)
RBC: 2.9 MIL/uL — ABNORMAL LOW (ref 4.22–5.81)
RDW: 14 % (ref 11.5–15.5)
WBC: 10.7 10*3/uL — ABNORMAL HIGH (ref 4.0–10.5)
nRBC: 0 % (ref 0.0–0.2)

## 2021-07-28 MED ORDER — LORAZEPAM 0.5 MG PO TABS
0.5000 mg | ORAL_TABLET | Freq: Every day | ORAL | Status: DC
  Administered 2021-07-28 – 2021-07-29 (×2): 0.5 mg via ORAL
  Filled 2021-07-28 (×2): qty 1

## 2021-07-28 MED ORDER — LORAZEPAM 1 MG PO TABS
1.0000 mg | ORAL_TABLET | Freq: Every day | ORAL | Status: DC
  Administered 2021-07-28: 1 mg via ORAL
  Filled 2021-07-28: qty 1

## 2021-07-28 MED ORDER — TRAMADOL HCL 50 MG PO TABS
50.0000 mg | ORAL_TABLET | Freq: Two times a day (BID) | ORAL | Status: DC | PRN
  Administered 2021-07-28: 50 mg via ORAL
  Filled 2021-07-28: qty 1

## 2021-07-28 NOTE — Progress Notes (Signed)
Inpatient Rehab Admissions Coordinator:   Spoke to patient's spouse over the phone to discuss CIR recommendations/goals.  I explained 3 hrs/day of therapy, and average length of stay about 2 weeks, dependent on progress.  We discussed likely need for 24/7 supervision, potentially up to min assist, at discharge and that getting that in place is typically the responsibility of the family.  We discussed using family, friends, church members, hired caregivers, Social research officer, government. She verbalized understanding.  I let her know we would need prior authorization from [employer] and I will begin that process today. Will follow for determination.   Shann Medal, PT, DPT Admissions Coordinator [phone] 07/28/21  2:17 PM

## 2021-07-28 NOTE — Assessment & Plan Note (Signed)
Takes lorazepam 2 times a day.  Restarted here as 0.5 mg in the morning and 1 mg at bedtime

## 2021-07-28 NOTE — Procedures (Signed)
History: 81 yo M being evaluated for encephalopathy  Sedation: none  Technique: This EEG was acquired with electrodes placed according to the International 10-20 electrode system (including Fp1, Fp2, F3, F4, C3, C4, P3, P4, O1, O2, T3, T4, T5, T6, A1, A2, Fz, Cz, Pz). The following electrodes were missing or displaced: none.   Background: There is a poorly formed and poorly sustained posterior dominant rhythm achieving a maximal frequency of 9-10 Hz. In addition there is diffuse generalized irregular delta and theta range activity seen throughout the recording. Well formed sleep is not recorded. No epileptiform discharges were seen.   Photic stimulation: Physiologic driving is not performed  EEG Abnormalities: 1) Generalized irregular slow activity.   Clinical Interpretation: This EEG is consistent with a generalized non-specific cerebral dysfunction(encephalopathy). There was no seizure or seizure predisposition recorded on this study. Please note that lack of epileptiform activity on EEG does not preclude the possibility of epilepsy.   Roland Rack, MD [facility] [phone]  If 7pm- 7am, please page neurology on call as listed in [place].

## 2021-07-28 NOTE — Progress Notes (Signed)
Inpatient Rehab Admissions Coordinator:   Left message with spouse to discuss CIR.    Shann Medal, PT, DPT Admissions Coordinator [phone] 07/28/21  11:29 AM

## 2021-07-28 NOTE — Progress Notes (Signed)
Speech Language Pathology Treatment: Dysphagia;Cognitive-Linquistic  Patient Details Name: William [name] MRN: [id number] DOB: [birth year] Today's Date: 07/28/2021 Time: 5284-1324 SLP Time Calculation (min) (ACUTE ONLY): 29 min  Assessment / Plan / Recommendation Clinical Impression  Aphasia/cognition: Speech output is more spontaneous today; volume improved but not at baseline.  Significant difficulty with tasks of divergent naming.  Max assist to read list of five items and eliminate those that did not belong in category.  Perseverative output persisting today but to lesser degree. Benefited from breaking down instructions into simple steps.  Moderate verbal cues for word-retrieval (naming family members).  Some disinhibited communication noted William [name], his daughter, reported this in not typical behavior for him). Accuracy when answering orientation questions was variable during session.  D/W William [name] methods for cueing for word retrieval.  Swallowing: Demonstrated excellent toleration of thin liquids with the appearance of improved timing, attention, and no s/s of aspiration. Continue regular solids/thin liquids  SLP will continue to follow for communication/swallowing.   HPI HPI: William [name] is a 81 y/o male presenting on 07/27/21 with AMS, slurred speech, facial droop, syncope, and difficulty walking. 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      SLP Plan  Continue with current plan of care      Recommendations for follow up therapy are one component of a multi-disciplinary discharge planning process, led by the attending physician.  Recommendations may be updated based on patient status, additional functional criteria and insurance authorization.    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 William [name], [place] Office number [phone] Pager [phone]  William [name] William [name]  07/28/2021, 1:27 PM

## 2021-07-28 NOTE — Progress Notes (Addendum)
PROGRESS NOTE  CURVIN HUNGER  MWU:[id number] DOB: 1940/07/15 DOA: 07/27/2021 PCP: Ann Held, DO   Brief Narrative: Patient is a 81 year old male with history of hypertension, hyperlipidemia, CVA with residual left-sided hemiplegia, carotid artery stenosis status post bilateral CEA, CKD stage IV, BPH, bladder cancer status post TURP, GERD who presented with earlier on 2/8 with vomiting, fall, difficulty following directions, worsening left-sided facial droop, difficulty in walking.  Stroke was suspected on admission.  Extensive work-up was done in the emergency department including MRI and CT which did not show any acute abnormalities.  Urinalysis was suspicious for UTI and was started on abx and was discharged to home from ED.  He returned again to ED with confusion, slurred speech, lethargy,fall.   MRI brain showed   acute perforator infarct in the left basal ganglia and overlyingccorona radiata. Neurology consulted and was admitted for stroke work-up.PT/OT recommending CIR on discharge   Assessment & Plan:  Principal Problem:   CVA (cerebral vascular accident) ([place]) Active Problems:   Carotid artery stenosis, asymptomatic, bilateral   BPH (benign prostatic hyperplasia)   History of right MCA infarct   HTN (hypertension)   Hyperlipidemia   Anxiety   Transaminitis   UTI (urinary tract infection)   Fall at home, initial encounter   Hyperglycemia   CKD (chronic kidney disease), stage IV (H[facility])   Cholelithiasis   Hypoxia   Nausea and vomiting   Acute metabolic encephalopathy   Normocytic anemia   Chronic back pain   Assessment and Plan: * CVA (cerebral vascular accident) ([place])- (present on admission) Patient presents after having a fall with reports altered mental status and slurred speech.  Imaging including MRI from 2/8 had not noted any signs of stroke.  However, after repeat CT scan of the brain was found patient had acute perforator infarct of the left basal ganglia  and corona radiata. 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  Neurology has been consulted.  He was not a tPA candidate as unknown time of onset.  Hemoglobin A1c was 5.3.LDL of 125 -Echo showed EF of 60 to 72%, grade 1 diastolic dysfunction, no intracardiac source of emboli. -Ultrasound of the carotid arteries did not show any significant stenosis -EEG did not show any seizure activity -He remains intermittently confused.  Oriented to place only.  Obeys commands. -PT/OT/Speech consulted, recommended CIR on discharge -Continue aspirin from home, Plavix added as patient had failed monotherapy  -He has residual weakness on the left side from previous right-sided stroke.  Currently has generalized weakness -Appreciate neurology consultative services, will follow-up for any further recommendations  Chronic back pain- (present on admission) -Continue tramadol as needed for back  Normocytic anemia- (present on admission) Chronic.  On admission hemoglobin 11.5 which appears around patient's baseline.  There was concern for possibility of patient vomiting up blood.  Stool guaiacs were negative.  Hemoglobin dropped to the range of 9 most likely this is from hemodilution, he is on IV fluids. -Continue to monitor blood counts  Acute metabolic encephalopathy- (present on admission) Normally patient is alert and oriented and able to follow commands.  However over the last 5 days wife notes that he had been more lethargic and was confused not able to really follow commands.  This is most likely secondary to stroke.  EEG did not  show any seizure   Nausea and vomiting- (present on admission) Patient presented due to episodes of vomiting prior to arrival with reports of coffee-ground emesis.  No subsequent  episodes since being admitted into the hospital.  Stool guaiacs were noted to be negative. Hemoglobin appears near baseline at this time.  .  Continue Protonix.  Nausea and vomiting have resolved  Hypoxia- (present on admission) Acute.  Normally patient not on oxygen at baseline O2 saturation documented as low as 73%, for which patient was placed on 2 L of nasal cannula oxygen.  Chest x-ray was otherwise noted to be clear and patient denies any complaints of shortness of breath.  Monitor on room air  Cholelithiasis- (present on admission)  Ultrasound noted multiple gallstones up to 1.5 cm with mild gallbladder wall thickening with positive sonographic Murphy sign.  Gallbladder duct was poorly seen.  General surgery has been formally consulted and patient was started on empiric antibiotics of Zosyn.  General surgery evaluated and recommended HIDA scan which ruled out cholecystitis.  Antibiotics discontinued.  Will recommend to follow-up with general surgery as an outpatient for cholelithiasis  CKD (chronic kidney disease), stage IV ([place])- (present on admission) On admission creatinine 2.47 with BUN 32. Creatinine as of this year has ranged from 2.2-2.4. -Hold possible nephrotoxic agents -Continue to monitor kidney function  Hyperglycemia- (present on admission) Acute.  On admission glucose was initially elevated up to 213.  Hemoglobin A1c was able to be obtained and noted to be 5.3.  Suspect elevated glucose secondary to acute stress response.   -Continue to monitor   Fall at home, initial encounter Patient had a fall at home.  PT/OT recommending CIR  UTI (urinary tract infection)- (present on admission) Prior to arrival.  Patient has been seen on 2/8 and started on cefdinir, he took for 5 days.  Repeat urinalysis appears improved from prior but still noted moderate leukocytes with rare bacteria, and 21-50 WBCs.  Urine culture on 2/8 did not show any growth.  Urine culture was obtained this time,  will follow.  Monitor off antibiotics  Transaminitis- (present on admission) Acute on chronic.  On admission AST 226 and ALT 115.  Records note ALT has been mildly elevated intermittently previously in the past.  [place] of gallbladder showed  mild gallbladder wall thickening and multiple gallstones present with poor visualization of the common bile duct. HIDA scan ruled out cholecystitis.  General surgery signed off.  Liver enzymes improving.  He does not have any right upper quadrant tenderness.  We recommend to follow-up with general surgery for cholelithiasis as an outpatient  Anxiety- (present on admission) Takes lorazepam 2 times a day.  Restarted here as 0.5 mg in the morning and 1 mg at bedtime  Hyperlipidemia- (present on admission)  Home medication regimen included fenofibrate 160 mg daily.  He has a prior history of intolerance to statins. Lipid panel showed LDL of 125  HTN (hypertension)- (present on admission) Chronic.  Blood pressures 153/71-170/90 on admission.  Neurology felt patient was out of the window for permissive hypertension.  Home blood pressure medication regimen includes lisinopril-hydrochlorothiazide 10-12.5 mg daily and amlodipine 10 mg daily. -Held lisinopril-hydrochlorothiazide due to kidney function -Continue amlodipine -Hydralazine IV as needed for elevated blood pressure  History of right MCA infarct- (present on admission) Patient was on aspirin daily and fenofibrate.  BPH (benign prostatic hyperplasia)- (present on admission) Patient with a history of bladder cancer and BPH requiring TURP followed in the outpatient setting by urology. -  Continue Flomax  Carotid artery stenosis, asymptomatic, bilateral- (present on admission) Patient with a prior history of carotid artery stenosis status post bilateral carotid artery endarterectomies in 2010.              DVT prophylaxis:SCD's Start: 07/27/21 4081     Code Status: Full Code  Family  Communication: Long discussion held with family at bedside  Patient status: Inpatient  Patient is from : Home  Anticipated discharge to: CIR  Estimated DC date: In next 2 to 3 days.  Stroke work-up still in progress, neurology needs to clear   Consultants:   Procedures:  Antimicrobials:  Anti-infectives (From admission, onward)    Start     Dose/Rate Route Frequency Ordered Stop   07/27/21 0730  piperacillin-tazobactam (ZOSYN) IVPB 3.375 g        3.375 g 100 mL/hr over 30 Minutes Intravenous  Once 07/27/21 0720 07/27/21 0845       Subjective: Patient seen and examined at the bedside this morning.  Hemodynamically stable.  Appears comfortable, alert and awake.  Not fully oriented but communicating well and he knows he is in the hospital.  .  He says he feels better and he does not have any new complaints.  Long discussion held with the family at bedside and answered their questions  Objective: Vitals:   07/27/21 2332 07/28/21 0312 07/28/21 0328 07/28/21 0739  BP: (!) 133/58  130/70 (!) 152/68  Pulse: 72 61  71  Resp: 16 18  18   Temp: 98.4 F (36.9 C) 97.9 F (36.6 C)  98.3 F (36.8 C)  TempSrc: Axillary Axillary  Oral  SpO2: 100% 97%  100%    Intake/Output Summary (Last 24 hours) at 07/28/2021 1104 Last data filed at 07/28/2021 4481 Gross per 24 hour  Intake 503.23 ml  Output --  Net 503.23 ml   There were no vitals filed for this visit.  Examination:  General exam: Deconditioned, chronically ill looking,  weak  HEENT: PERRL, left-sided droop Respiratory system:  no wheezes or crackles  Cardiovascular system: S1 & S2 heard, RRR.  Gastrointestinal system: Abdomen is nondistended, soft and nontender. Central nervous system: Alert and awake, obeys commands, left residual hemiparesis, new weakness on the right side. Strength: Motor of 4/5 in all extremities Extremities: No edema, no clubbing ,no cyanosis Skin: No rashes, no ulcers,no icterus     Data  Reviewed: I have personally reviewed following labs and imaging studies  CBC: Recent Labs  Lab 07/22/21 0633 07/22/21 0653 07/27/21 0307 07/27/21 1619 07/28/21 0435  WBC 7.3  --  9.8  --  10.7*  NEUTROABS 4.1  --  7.9*  --   --   HGB 11.2* 11.9* 11.5* 11.0* 9.4*  HCT 34.6* 35.0* 34.5* 32.8* 28.8*  MCV 98.3  --  98.3  --  99.3  PLT 265  --  244  --  856   Basic Metabolic Panel: Recent Labs  Lab 07/22/21 0633 07/22/21 0653 07/27/21 0307 07/28/21 0435  NA 139 141 141 141  K 4.1 4.2 4.2 4.0  CL 105 108 106 110  CO2 22  --  24 22  GLUCOSE 110* 108* 213* 117*  BUN 31* 31* 32* 33*  CREATININE 2.28* 2.40* 2.47* 2.50*  CALCIUM 9.1  --  9.1 9.0     Recent Results (from the past 240 hour(s))  Resp Panel by RT-PCR (Flu A&B, Covid) Nasopharyngeal Swab     Status: None   Collection Time: 07/22/21  6:20  AM   Specimen: Nasopharyngeal Swab; Nasopharyngeal(NP) swabs in vial transport medium  Result Value Ref Range Status   SARS Coronavirus 2 by RT PCR NEGATIVE NEGATIVE Final    Comment: (NOTE) SARS-CoV-2 target nucleic acids are NOT DETECTED.  The SARS-CoV-2 RNA is generally detectable in upper respiratory specimens during the acute phase of infection. The lowest concentration of SARS-CoV-2 viral copies this assay can detect is 138 copies/mL. A negative result does not preclude SARS-Cov-2 infection and should not be used as the sole basis for treatment or other patient management decisions. A negative result may occur with  improper specimen collection/handling, submission of specimen other than nasopharyngeal swab, presence of viral mutation(s) within the areas targeted by this assay, and inadequate number of viral copies(<138 copies/mL). A negative result must be combined with clinical observations, patient history, and epidemiological information. The expected result is Negative.  Fact Sheet for Patients:  EntrepreneurPulse.com.au  Fact Sheet for  Healthcare Providers:  IncredibleEmployment.be  This test is no t yet approved or cleared by the [place] FDA and  has been authorized for detection and/or diagnosis of SARS-CoV-2 by FDA under an Emergency Use Authorization (EUA). This EUA will remain  in effect (meaning this test can be used) for the duration of the COVID-19 declaration under Section 564(b)(1) of the Act, 21 U.S.C.section 360bbb-3(b)(1), unless the authorization is terminated  or revoked sooner.       Influenza A by PCR NEGATIVE NEGATIVE Final   Influenza B by PCR NEGATIVE NEGATIVE Final    Comment: (NOTE) The Xpert Xpress SARS-CoV-2/FLU/RSV plus assay is intended as an aid in the diagnosis of influenza from Nasopharyngeal swab specimens and should not be used as a sole basis for treatment. Nasal washings and aspirates are unacceptable for Xpert Xpress SARS-CoV-2/FLU/RSV testing.  Fact Sheet for Patients: EntrepreneurPulse.com.au  Fact Sheet for Healthcare Providers: IncredibleEmployment.be  This test is not yet approved or cleared by the [place] FDA and has been authorized for detection and/or diagnosis of SARS-CoV-2 by FDA under an Emergency Use Authorization (EUA). This EUA will remain in effect (meaning this test can be used) for the duration of the COVID-19 declaration under Section 564(b)(1) of the Act, 21 U.S.C. section 360bbb-3(b)(1), unless the authorization is terminated or revoked.  Performed at [facility], [place] [address], [place], [place] [address]   Urine Culture     Status: None   Collection Time: 07/22/21 11:56 AM   Specimen: Urine, Clean Catch  Result Value Ref Range Status   Specimen Description URINE, CLEAN CATCH  Final   Special Requests NONE  Final   Culture   Final    NO GROWTH Performed at [facility], [place] [address], [place], [place] [address]    Report Status 07/23/2021 FINAL  Final  Resp Panel by  RT-PCR (Flu A&B, Covid) Nasopharyngeal Swab     Status: None   Collection Time: 07/27/21  3:34 AM   Specimen: Nasopharyngeal Swab; Nasopharyngeal(NP) swabs in vial transport medium  Result Value Ref Range Status   SARS Coronavirus 2 by RT PCR NEGATIVE NEGATIVE Final    Comment: (NOTE) SARS-CoV-2 target nucleic acids are NOT DETECTED.  The SARS-CoV-2 RNA is generally detectable in upper respiratory specimens during the acute phase of infection. The lowest concentration of SARS-CoV-2 viral copies this assay can detect is 138 copies/mL. A negative result does not preclude SARS-Cov-2 infection and should not be used as the sole basis for treatment or other patient management decisions.  A negative result may occur with  improper specimen collection/handling, submission of specimen other than nasopharyngeal swab, presence of viral mutation(s) within the areas targeted by this assay, and inadequate number of viral copies(<138 copies/mL). A negative result must be combined with clinical observations, patient history, and epidemiological information. The expected result is Negative.  Fact Sheet for Patients:  EntrepreneurPulse.com.au  Fact Sheet for Healthcare Providers:  IncredibleEmployment.be  This test is no t yet approved or cleared by the [place] FDA and  has been authorized for detection and/or diagnosis of SARS-CoV-2 by FDA under an Emergency Use Authorization (EUA). This EUA will remain  in effect (meaning this test can be used) for the duration of the COVID-19 declaration under Section 564(b)(1) of the Act, 21 U.S.C.section 360bbb-3(b)(1), unless the authorization is terminated  or revoked sooner.       Influenza A by PCR NEGATIVE NEGATIVE Final   Influenza B by PCR NEGATIVE NEGATIVE Final    Comment: (NOTE) The Xpert Xpress SARS-CoV-2/FLU/RSV plus assay is intended as an aid in the diagnosis of influenza from Nasopharyngeal swab  specimens and should not be used as a sole basis for treatment. Nasal washings and aspirates are unacceptable for Xpert Xpress SARS-CoV-2/FLU/RSV testing.  Fact Sheet for Patients: EntrepreneurPulse.com.au  Fact Sheet for Healthcare Providers: IncredibleEmployment.be  This test is not yet approved or cleared by the [place] FDA and has been authorized for detection and/or diagnosis of SARS-CoV-2 by FDA under an Emergency Use Authorization (EUA). This EUA will remain in effect (meaning this test can be used) for the duration of the COVID-19 declaration under Section 564(b)(1) of the Act, 21 U.S.C. section 360bbb-3(b)(1), unless the authorization is terminated or revoked.  Performed at [facility], [place] [address], [place], [place] [address]   Urine Culture     Status: None   Collection Time: 07/27/21  6:14 AM   Specimen: Urine, Clean Catch  Result Value Ref Range Status   Specimen Description URINE, CLEAN CATCH  Final   Special Requests NONE  Final   Culture   Final    NO GROWTH Performed at [facility], [place] [address], [place], [place] [address]    Report Status 07/28/2021 FINAL  Final     Radiology Studies: CT HEAD WO CONTRAST (5MM)  Result Date: 07/27/2021 CLINICAL DATA:  Minor head trauma.  Vomiting and syncope EXAM: CT HEAD WITHOUT CONTRAST CT CERVICAL SPINE WITHOUT CONTRAST TECHNIQUE: Multidetector CT imaging of the head and cervical spine was performed following the standard protocol without intravenous contrast. Multiplanar CT image reconstructions of the cervical spine were also generated. RADIATION DOSE REDUCTION: This exam was performed according to the departmental dose-optimization program which includes automated exposure control, adjustment of the mA and/or kV according to patient size and/or use of iterative reconstruction technique. COMPARISON:  Brain MRI from 5 days ago FINDINGS: CT HEAD FINDINGS Brain:  Interval and acute appearing infarct at the left stratum and corona radiata. Large remote right MCA territory infarct affecting the upper division. Brain atrophy. No acute hemorrhage or hydrocephalus Vascular: No hyperdense vessel or unexpected calcification. Skull: Normal. Negative for fracture or focal lesion. Sinuses/Orbits: No acute finding. CT CERVICAL SPINE FINDINGS Alignment: No traumatic malalignment Skull base and vertebrae: No acute fracture Soft tissues and spinal canal: No prevertebral fluid or swelling. No visible canal hematoma. Disc levels:  Ordinary degenerative changes. 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 These results were called by telephone at the time  of interpretation on 07/27/2021 at 4:17 am to provider [facility] , who verbally acknowledged these results. IMPRESSION: 1. 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 2. No posttraumatic intracranial or cervical spine finding. 3. Remote right MCA upper division infarcts. Electronically Signed   By: Jorje Guild M.D.   On: 07/27/2021 04:17   CT CERVICAL SPINE WO CONTRAST  Result Date: 07/27/2021 CLINICAL DATA:  Minor head trauma.  Vomiting and syncope EXAM: CT HEAD WITHOUT CONTRAST CT CERVICAL SPINE WITHOUT CONTRAST TECHNIQUE: Multidetector CT imaging of the head and cervical spine was performed following the standard protocol without intravenous contrast. Multiplanar CT image reconstructions of the cervical spine were also generated. RADIATION DOSE REDUCTION: This exam was performed according to the departmental dose-optimization program which includes automated exposure control, adjustment of the mA and/or kV according to patient size and/or use of iterative reconstruction technique. COMPARISON:  Brain MRI from 5 days ago FINDINGS: CT HEAD FINDINGS Brain: Interval and acute appearing infarct at the left stratum and corona radiata. Large remote right MCA territory  infarct affecting the upper division. Brain atrophy. No acute hemorrhage or hydrocephalus Vascular: No hyperdense vessel or unexpected calcification. Skull: Normal. Negative for fracture or focal lesion. Sinuses/Orbits: No acute finding. CT CERVICAL SPINE FINDINGS Alignment: No traumatic malalignment Skull base and vertebrae: No acute fracture Soft tissues and spinal canal: No prevertebral fluid or swelling. No visible canal hematoma. Disc levels:  Ordinary degenerative changes. 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 These results were called by telephone at the time of interpretation on 07/27/2021 at 4:17 am to provider [facility] , who verbally acknowledged these results. IMPRESSION: 1. 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 2. No posttraumatic intracranial or cervical spine finding. 3. Remote right MCA upper division infarcts. Electronically Signed   By: Jorje Guild M.D.   On: 07/27/2021 04:17   MR ANGIO HEAD WO CONTRAST  Result Date: 07/27/2021 CLINICAL DATA:  Neuro deficit, acute, stroke suspected EXAM: MRI HEAD WITHOUT CONTRAST MRA HEAD WITHOUT CONTRAST TECHNIQUE: Multiplanar, multi-echo pulse sequences of the brain and surrounding structures were acquired without intravenous contrast. Angiographic images of the Circle of Willis were acquired using MRA technique without intravenous contrast. COMPARISON:  CT head from the same day. MRA August 07, 2005 without report. 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 Associated edema without mass effect. Large remote right MCA territory infarct with encephalomalacia and surrounding gliosis. Evidence of some prior hemorrhage along the posterior aspect of this remote infarct. No evidence of acute hemorrhage. Vascular: See below. Skull and upper cervical spine: Normal marrow signal. Sinuses/Orbits: Mild  paranasal sinus mucosal thickening. Unremarkable orbits. Other: No sizable mastoid effusions. MRA HEAD FINDINGS Anterior circulation: Bilateral intracranial ICAs are patent. Moderate right paraclinoid ICA stenosis. Subtle apparent filling defect of the right paraclinoid ICA in the region of stenosis is favored to reflect flow artifact given preserved T2 hypointense flow void on MRI. Bilateral MCAs are patent. Severe distal left M1 MCA stenosis. Attenuated distal right MCA vessels in the region of remote infarct. Bilateral ACAs are patent without proximal hemodynamically significant stenosis. Posterior circulation: Only the distal most intradural vertebral arteries are visualized, which are patent. The basilar artery and bilateral posterior cerebral arteries are patent without proximal hemodynamically significant stenosis. Prominent left posterior communicating artery with small or absent left P1 PCA, anatomic variant. Anatomic variants: Detailed above. IMPRESSION: MRI:  1. 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 2. Large remote right MCA territory infarct. MRA: 1. No large vessel occlusion. 2. Severe distal left M1 MCA stenosis. 3. Moderate right paraclinoid ICA stenosis. 4. Attenuated distal right MCA vessels in the region of remote infarct. Electronically Signed   By: Margaretha Sheffield M.D.   On: 07/27/2021 10:06   MR BRAIN WO CONTRAST  Result Date: 07/27/2021 CLINICAL DATA:  Neuro deficit, acute, stroke suspected EXAM: MRI HEAD WITHOUT CONTRAST MRA HEAD WITHOUT CONTRAST TECHNIQUE: Multiplanar, multi-echo pulse sequences of the brain and surrounding structures were acquired without intravenous contrast. Angiographic images of the Circle of Willis were acquired using MRA technique without intravenous contrast. COMPARISON:  CT head from the same day. MRA August 07, 2005 without report. FINDINGS: MRI HEAD FINDINGS Brain: Acute perforator infarct in the left basal ganglia  and overlying corona radiata. Associated edema without mass effect. Large remote right MCA territory infarct with encephalomalacia and surrounding gliosis. Evidence of some prior hemorrhage along the posterior aspect of this remote infarct. No evidence of acute hemorrhage. Vascular: See below. Skull and upper cervical spine: Normal marrow signal. Sinuses/Orbits: Mild paranasal sinus mucosal thickening. Unremarkable orbits. Other: No sizable mastoid effusions. MRA HEAD FINDINGS Anterior circulation: Bilateral intracranial ICAs are patent. Moderate right paraclinoid ICA stenosis. Subtle apparent filling defect of the right paraclinoid ICA in the region of stenosis is favored to reflect flow artifact given preserved T2 hypointense flow void on MRI. Bilateral MCAs are patent. Severe distal left M1 MCA stenosis. Attenuated distal right MCA vessels in the region of remote infarct. Bilateral ACAs are patent without proximal hemodynamically significant stenosis. Posterior circulation: Only the distal most intradural vertebral arteries are visualized, which are patent. The basilar artery and bilateral posterior cerebral arteries are patent without proximal hemodynamically significant stenosis. Prominent left posterior communicating artery with small or absent left P1 PCA, anatomic variant. Anatomic variants: Detailed above. IMPRESSION: MRI: 1. 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 2. Large remote right MCA territory infarct. MRA: 1. No large vessel occlusion. 2. Severe distal left M1 MCA stenosis. 3. Moderate right paraclinoid ICA stenosis. 4. Attenuated distal right MCA vessels in the region of remote infarct. Electronically Signed   By: Margaretha Sheffield M.D.   On: 07/27/2021 10:06   NM Hepatobiliary Liver Func  Result Date: 07/27/2021 CLINICAL DATA:  Right upper quadrant abdominal pain, with sonographic Murphy sign, gallstones, and gallbladder wall thickening on ultrasound.  EXAM: NUCLEAR MEDICINE HEPATOBILIARY IMAGING TECHNIQUE: Sequential images of the abdomen were obtained out to 60 minutes following intravenous administration of radiopharmaceutical. RADIOPHARMACEUTICALS:  5.3 mCi Tc-85m  Choletec IV COMPARISON:  Abdominal ultrasound 07/27/2021 FINDINGS: There is satisfactory uptake of radiopharmaceutical from the blood pool. Biliary activity is visible 15 minutes. Gallbladder activity visible by 20 minutes. Consecutive images show filling of the gallbladder. Bowel activity is shown by 70 minutes. IMPRESSION: 1. Currently the cystic duct is patent, with gallbladder activity visible 20 minutes after injection. The CBD is also patent, with bowel activity visible at 70 minutes and thereafter. Electronically Signed   By: Van Clines M.D.   On: 07/27/2021 15:07   DG Chest Port 1 View  Result Date: 07/27/2021 CLINICAL DATA:  Fall EXAM: PORTABLE CHEST 1 VIEW COMPARISON:  11/13/2020 FINDINGS: Heart and mediastinal contours are within normal limits. No focal opacities or effusions. No acute bony abnormality. No visible rib fracture or pneumothorax. IMPRESSION: No active disease. Electronically Signed  By: Rolm Baptise M.D.   On: 07/27/2021 03:27   EEG adult  Result Date: 07/27/2021 Greta Doom, MD     07/28/2021  9:13 AM History: 81 yo M being evaluated for encephalopathy Sedation: none Technique: This EEG was acquired with electrodes placed according to the International 10-20 electrode system (including Fp1, Fp2, F3, F4, C3, C4, P3, P4, O1, O2, T3, T4, T5, T6, A1, A2, Fz, Cz, Pz). The following electrodes were missing or displaced: none. Background: There is a poorly formed and poorly sustained posterior dominant rhythm achieving a maximal frequency of 9-10 Hz. In addition there is diffuse generalized irregular delta and theta range activity seen throughout the recording. Well formed sleep is not recorded. No epileptiform discharges were seen. Photic stimulation:  Physiologic driving is not performed EEG Abnormalities: 1) Generalized irregular slow activity. Clinical Interpretation: This EEG is consistent with a generalized non-specific cerebral dysfunction(encephalopathy). There was no seizure or seizure predisposition recorded on this study. Please note that lack of epileptiform activity on EEG does not preclude the possibility of epilepsy. Roland Rack, MD [facility] [phone] If 7pm- 7am, please page neurology on call as listed in [place].   ECHOCARDIOGRAM COMPLETE  Result Date: 07/27/2021    ECHOCARDIOGRAM REPORT   Patient Name:   OCTAVIA MOTTOLA Date of Exam: 07/27/2021 Medical Rec #:  [id number]      Height:       71.0 in Accession #:    [id number]     Weight:       213.0 lb Date of Birth:  1940/06/23      BSA:          2.166 m Patient Age:    70 years       BP:           144/75 mmHg Patient Gender: M              HR:           86 bpm. Exam Location:  Inpatient Procedure: 2D Echo, Cardiac Doppler, Color Doppler and Intracardiac            Opacification Agent Indications:     Stroke  History:         Patient has no prior history of Echocardiogram examinations.  Sonographer:     Jyl Heinz Referring Phys:  [id number] RONDELL A Hughett Diagnosing Phys: Oswaldo Milian MD IMPRESSIONS  1. Left ventricular ejection fraction, by estimation, is 60 to 65%. The left ventricle has normal function. The left ventricle has no regional wall motion abnormalities. There is mild left ventricular hypertrophy. Left ventricular diastolic parameters are consistent with Grade I diastolic dysfunction (impaired relaxation).  2. Right ventricular systolic function is normal. The right ventricular size is normal. Tricuspid regurgitation signal is inadequate for assessing PA pressure.  3. The mitral valve is grossly normal. No evidence of mitral valve regurgitation. No evidence of mitral stenosis.  4. The aortic valve was not well visualized. Aortic valve regurgitation is  not visualized. No aortic stenosis is present. FINDINGS  Left Ventricle: Left ventricular ejection fraction, by estimation, is 60 to 65%. The left ventricle has normal function. The left ventricle has no regional wall motion abnormalities. The left ventricular internal cavity size was normal in size. There is  mild left ventricular hypertrophy. Left ventricular diastolic parameters are consistent with Grade I diastolic dysfunction (impaired relaxation). Right Ventricle: The right ventricular size is normal. Right vetricular wall thickness was not well visualized. Right ventricular systolic  function is normal. Tricuspid regurgitation signal is inadequate for assessing PA pressure. Left Atrium: Left atrial size was normal in size. Right Atrium: Right atrial size was normal in size. Pericardium: There is no evidence of pericardial effusion. Presence of epicardial fat layer. Mitral Valve: The mitral valve is grossly normal. No evidence of mitral valve regurgitation. No evidence of mitral valve stenosis. Tricuspid Valve: The tricuspid valve is normal in structure. Tricuspid valve regurgitation is trivial. Aortic Valve: The aortic valve was not well visualized. Aortic valve regurgitation is not visualized. No aortic stenosis is present. Aortic valve peak gradient measures 11.8 mmHg. Pulmonic Valve: The pulmonic valve was not well visualized. Pulmonic valve regurgitation is not visualized. Aorta: The aortic root and ascending aorta are structurally normal, with no evidence of dilitation. IAS/Shunts: The interatrial septum was not well visualized.  LEFT VENTRICLE PLAX 2D LVIDd:         4.20 cm      Diastology LVIDs:         2.80 cm      LV e' medial:    4.79 cm/s LV PW:         1.40 cm      LV E/e' medial:  11.0 LV IVS:        1.20 cm      LV e' lateral:   7.07 cm/s LVOT diam:     2.00 cm      LV E/e' lateral: 7.4 LV SV:         60 LV SV Index:   28 LVOT Area:     3.14 cm  LV Volumes (MOD) LV vol d, MOD A2C: 105.0 ml LV  vol d, MOD A4C: 118.0 ml LV vol s, MOD A2C: 36.7 ml LV vol s, MOD A4C: 42.4 ml LV SV MOD A2C:     68.3 ml LV SV MOD A4C:     118.0 ml LV SV MOD BP:      74.1 ml RIGHT VENTRICLE RV Basal diam:  3.50 cm RV Mid diam:    2.70 cm RV S prime:     14.50 cm/s TAPSE (M-mode): 2.2 cm LEFT ATRIUM             Index        RIGHT ATRIUM           Index LA diam:        3.40 cm 1.57 cm/m   RA Area:     15.00 cm LA Vol (A2C):   42.5 ml 19.62 ml/m  RA Volume:   36.30 ml  16.76 ml/m LA Vol (A4C):   66.2 ml 30.57 ml/m LA Biplane Vol: 55.0 ml 25.40 ml/m  AORTIC VALVE AV Area (Vmax): 2.03 cm AV Vmax:        172.00 cm/s AV Peak Grad:   11.8 mmHg LVOT Vmax:      111.00 cm/s LVOT Vmean:     73.500 cm/s LVOT VTI:       0.190 m  AORTA Ao Root diam: 3.80 cm Ao Asc diam:  3.30 cm MITRAL VALVE MV Area (PHT): 4.44 cm    SHUNTS MV Decel Time: 171 msec    Systemic VTI:  0.19 m MV E velocity: 52.50 cm/s  Systemic Diam: 2.00 cm MV A velocity: 75.50 cm/s MV E/A ratio:  0.70 Oswaldo Milian MD Electronically signed by Oswaldo Milian MD Signature Date/Time: 07/27/2021/3:59:23 PM    Final (Updated)    VAS US CAROTID  Result Date: 07/27/2021 Carotid  Arterial Duplex Study Patient Name:  HUTSON LUFT  Date of Exam:   07/27/2021 Medical Rec #: [id number]       Accession #:    [id number] Date of Birth: 1941-05-01       Patient Gender: M Patient Age:   85 years Exam Location:  [facility] Procedure:      VAS US CAROTID Referring Phys: Joseph Berkshire --------------------------------------------------------------------------------  Indications:       CVA, Carotid artery disease and bilateral endarterectomies                    (right 2006, left 2010). Risk Factors:      Hypertension, past history of smoking, prior CVA. Other Factors:     History of bladder cancer. Comparison Study:  Prior Carotid duplex done 01/30/20 indicated 1-39% ICA                    stenosis bilaterally and patent CEAs. Performing Technologist: Sharion Dove RVS  Examination Guidelines: A complete evaluation includes B-mode imaging, spectral Doppler, color Doppler, and power Doppler as needed of all accessible portions of each vessel. Bilateral testing is considered an integral part of a complete examination. Limited examinations for reoccurring indications may be performed as noted.  Right Carotid Findings: +----------+--------+--------+--------+------------------+--------+             PSV cm/s EDV cm/s Stenosis Plaque Description Comments  +----------+--------+--------+--------+------------------+--------+  CCA Prox   78       16                                             +----------+--------+--------+--------+------------------+--------+  CCA Distal 94       19                                             +----------+--------+--------+--------+------------------+--------+  ICA Prox   44       11       1-39%    heterogenous                 +----------+--------+--------+--------+------------------+--------+  ICA Distal 67       20                                             +----------+--------+--------+--------+------------------+--------+  ECA        97       22                                             +----------+--------+--------+--------+------------------+--------+ +----------+--------+-------+--------+-------------------+             PSV cm/s EDV cms Describe Arm Pressure (mmHG)  +----------+--------+-------+--------+-------------------+  Subclavian 167                                            +----------+--------+-------+--------+-------------------+ +---------+--------+--+--------+--+  Vertebral PSV cm/s 67 EDV cm/s 20  +---------+--------+--+--------+--+  Left Carotid Findings: +----------+--------+--------+--------+------------------+------------------+             PSV cm/s EDV cm/s Stenosis Plaque Description Comments            +----------+--------+--------+--------+------------------+------------------+  CCA Prox   148      18                                    intimal thickening  +----------+--------+--------+--------+------------------+------------------+  CCA Distal 108      14                                   intimal thickening  +----------+--------+--------+--------+------------------+------------------+  ICA Prox   48       14       1-39%    heterogenous                           +----------+--------+--------+--------+------------------+------------------+  ICA Distal 92       25                                                       +----------+--------+--------+--------+------------------+------------------+  ECA        123      21                                                       +----------+--------+--------+--------+------------------+------------------+ +----------+--------+--------+--------+-------------------+             PSV cm/s EDV cm/s Describe Arm Pressure (mmHG)  +----------+--------+--------+--------+-------------------+  Subclavian 93                                              +----------+--------+--------+--------+-------------------+ +---------+--------+--+--------+--+  Vertebral PSV cm/s 62 EDV cm/s 17  +---------+--------+--+--------+--+   Summary: Right Carotid: Velocities in the right ICA are consistent with a 1-39% stenosis. Left Carotid: There is no evidence of stenosis in the left ICA. Vertebrals:  Bilateral vertebral arteries demonstrate antegrade flow. Subclavians: Normal flow hemodynamics were seen in bilateral subclavian              arteries. *See table(s) above for measurements and observations.     Preliminary    VAS [place] TRANSCRANIAL DOPPLER  Result Date: 07/27/2021  Transcranial Doppler Patient Name:  GERAN HAITHCOCK  Date of Exam:   07/27/2021 Medical Rec #: [id number]       Accession #:    [id number] Date of Birth: Jul 09, 1940       Patient Gender: M Patient Age:   43 years Exam Location:  [facility] Procedure:      VAS [place] TRANSCRANIAL DOPPLER Referring Phys: PRAMOD SETHI  --------------------------------------------------------------------------------  Indications: Stroke. Comparison Study: No prior study Performing Technologist: Sharion Dove RVS  Examination Guidelines: A complete evaluation includes B-mode imaging, spectral Doppler, color Doppler, and power Doppler as needed of all accessible portions of each vessel. Bilateral  testing is considered an integral part of a complete examination. Limited examinations for reoccurring indications may be performed as noted.  +----------+-------------+----------+-----------+-------+  RIGHT TCD  Right VM (cm) Depth (cm) Pulsatility Comment  +----------+-------------+----------+-----------+-------+  MCA            51.00                   1.29              +----------+-------------+----------+-----------+-------+  ACA           -36.00                   1.20              +----------+-------------+----------+-----------+-------+  Term ICA       51.00                   1.28              +----------+-------------+----------+-----------+-------+  PCA            25.00                   1.14              +----------+-------------+----------+-----------+-------+  Opthalmic      30.00                   1.30              +----------+-------------+----------+-----------+-------+  ICA siphon     22.00                   1.22              +----------+-------------+----------+-----------+-------+  Vertebral      36.00                   1.00              +----------+-------------+----------+-----------+-------+  +----------+------------+----------+-----------+------------------+  LEFT TCD   Left VM (cm) Depth (cm) Pulsatility      Comment        +----------+------------+----------+-----------+------------------+  MCA           101.00       4.30       1.56                         +----------+------------+----------+-----------+------------------+  ACA                                            Unable to insonate   +----------+------------+----------+-----------+------------------+  Term ICA      55.00                   1.51                         +----------+------------+----------+-----------+------------------+  PCA           34.00                   1.16                         +----------+------------+----------+-----------+------------------+  Opthalmic     25.00  1.26                         +----------+------------+----------+-----------+------------------+  ICA siphon    23.00                   1.58                         +----------+------------+----------+-----------+------------------+  Vertebral     -21.00                  1.52                         +----------+------------+----------+-----------+------------------+  +------------+------+-------+               VM cm  Comment  +------------+------+-------+  Prox Basilar -27.00          +------------+------+-------+    Preliminary    US ABDOMEN LIMITED RUQ (LIVER/GB)  Result Date: 07/27/2021 CLINICAL DATA:  Abdominal pain with elevated LFTs. EXAM: ULTRASOUND ABDOMEN LIMITED RIGHT UPPER QUADRANT COMPARISON:  CT without contrast 04/28/2021 FINDINGS: Gallbladder: The gallbladder is filled with stones, largest measurable stone approaching 1.5 cm. There is a thickened anterior free wall measuring 5 mm with a positive sonographic Murphy sign. This is concerning for acute cholecystitis. Common bile duct: Diameter: Unable to visualize. Liver: No focal lesion identified. There is increased hepatic echogenicity consistent with steatosis. Portal vein is patent on color Doppler imaging with normal direction of blood flow towards the liver. Other: 5 cm simple appearing cyst again noted in the upper pole right kidney. No free fluid is evident. IMPRESSION: 1. Mild thickening of the gallbladder with positive sonographic Murphy sign and multiple stones. Findings concerning for acute cholecystitis. No pericholecystic fluid. 2. Fatty liver. 3. Unable to  visualize the bile ducts due to shadowing by the gallbladder and due to bowel gas. 4. 5 cm simple cyst right kidney. Electronically Signed   By: Telford Nab M.D.   On: 07/27/2021 06:10    Scheduled Meds:   stroke: mapping our early stages of recovery book   Does not apply Once   amLODipine  10 mg Oral q morning   aspirin EC  81 mg Oral Daily   carbamazepine  100 mg Oral q AM   And   carbamazepine  200 mg Oral QHS   clopidogrel  75 mg Oral Daily   fenofibrate  160 mg Oral QHS   LORazepam  0.5 mg Oral Daily   LORazepam  1 mg Oral QHS   pantoprazole  40 mg Intravenous Q12H   tamsulosin  0.4 mg Oral QHS   Continuous Infusions:  sodium chloride 75 mL/hr at 07/28/21 0643     LOS: 1 day   Shelly Coss, MD [facility] P2/14/2023, 11:04 AM

## 2021-07-28 NOTE — Progress Notes (Signed)
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    PT Comments    Pt with improved OOB mobility but remains maxAx2 for bed mobility. Pt was able to transfer to [facility] and then to chair today but required short, simple, step by step commands and increased time. Pt also remains for confused and disoriented with impaired short term memory, processing, and motor planning. Spoke at length with the family regarding d/c planning as son and daughter live out of state. Recommending AIR upon d/c for maximal functional recovery at this time as pt has demonstrated improvement since yesterday. Acute PT to cont to follow.    Recommendations for follow up therapy are one component of a multi-disciplinary discharge planning process, led by the attending physician.  Recommendations may be updated based on patient status, additional functional criteria and insurance authorization.  Follow Up Recommendations  Acute inpatient rehab (3hours/day)     Assistance Recommended at Discharge Frequent or constant Supervision/Assistance  Patient can return home with the following Two people to help with walking and/or transfers;Two people to help with bathing/dressing/bathroom;Assistance with cooking/housework;Assistance with feeding;Direct supervision/assist for medications management;Direct supervision/assist for financial management;Assist for transportation;Help with stairs or ramp for entrance   Equipment Recommendations  Other (comment) (TBD)    Recommendations for Other Services        Precautions / Restrictions Precautions Precautions: Fall Restrictions Weight Bearing Restrictions: No     Mobility  Bed Mobility Overal bed mobility: Needs Assistance Bed Mobility: Supine to Sit     Supine to sit: +2 for physical assistance, Max assist, +2 for safety/equipment     General bed mobility comments: pt very stiff and rigid, especially at pelvis and holding legs in abduction, completed helicopter transfer to EOB with HOB elevated. pt with minimal effort to assist but also with noted impaired motor planning and processing    Transfers Overall transfer level: Needs assistance Equipment used: 2 person hand held assist (with gait belt) Transfers: Sit to/from Stand, Bed to chair/wheelchair/BSC Sit to Stand: Min assist, +2 physical assistance, Mod assist   Step pivot transfers: Mod assist       General transfer comment: pt requiring step by step commands ie. lean forward, place hands on bed, now stand. pt minA to power up but required modA via tactile cues for weight shifting to step to [facility] and then to chair    Ambulation/Gait               General Gait Details: limited to step pvt to [facility] and to then to chair.   Stairs             Wheelchair Mobility    Modified Rankin (Stroke Patients Only) Modified Rankin (Stroke Patients Only) Pre-Morbid Rankin Score: Moderate disability Modified Rankin: Moderately severe disability     Balance Overall balance assessment: Needs assistance Sitting-balance support: Feet supported, No upper extremity supported Sitting balance-Leahy Scale: Fair Sitting balance - Comments: pt initially requiring modA but once pt settled in pt min guard Postural control: Posterior lean (initially) Standing balance support: Bilateral upper extremity supported, During functional activity Standing balance-Leahy  Scale: Fair Standing balance comment: relies on holding onto PTs arms in face to face transfer                             Cognition Arousal/Alertness: Awake/alert Behavior During Therapy: Flat affect Overall Cognitive Status: Impaired/Different from baseline Area of Impairment: Attention, Problem solving, Awareness, Orientation, Memory, Following commands, Safety/judgement                 Orientation Level: Disoriented to, Place, Time, Situation (able to recall having a stroke, hospital and  at end of session) Current Attention Level: Sustained Memory: Decreased short-term memory Following Commands: Follows one step commands with increased time, Follows multi-step commands inconsistently Safety/Judgement: Decreased awareness of safety, Decreased awareness of deficits Awareness: Intellectual Problem Solving: Slow processing, Decreased initiation, Difficulty sequencing, Requires verbal cues, Requires tactile cues General Comments: Pt confused unsure if from UTI or related to stroke. Pt requiring short simple step by step commands for all mobility        Exercises      General Comments General comments (skin integrity, edema, etc.): VSS      Pertinent Vitals/Pain Pain Assessment Pain Assessment: Faces Faces Pain Scale: Hurts a little bit Pain Location: L flank, but would also say "ow" when legs were touched but not consistent Pain Descriptors / Indicators: Aching Pain Intervention(s): Monitored during session    Home Living                          Prior Function            PT Goals (current goals can now be found in the care plan section) Acute Rehab PT Goals Patient Stated Goal: to go home PT Goal Formulation: With patient/family Time For Goal Achievement: 08/10/21 Potential to Achieve Goals: Fair Progress towards PT goals: Progressing toward goals    Frequency    Min 4X/week      PT Plan Current plan remains appropriate    Co-evaluation              AM-PAC PT "6 Clicks" Mobility   Outcome Measure  Help needed turning from your  back to your side while in a flat bed without using bedrails?: A Lot Help needed moving from lying on your back to sitting on the side of a flat bed without using bedrails?: A Lot Help needed moving to and from a bed to a chair (including a wheelchair)?: A Lot Help needed standing up from a chair using your arms (e.g., wheelchair or bedside chair)?: A Lot Help needed to walk in hospital room?: A Lot Help needed climbing 3-5 steps with a railing? : Total 6 Click Score: 11    End of Session Equipment Utilized During Treatment: Gait belt Activity Tolerance: Patient tolerated treatment well Patient left: with call bell/phone within reach;with family/visitor present;in chair;with chair alarm set (on stretcher in ED) Nurse Communication: Mobility status (how to transfer pt back to bed using short, simple step by step commands) PT Visit Diagnosis: Muscle weakness (generalized) (M62.81);Unsteadiness on feet (R26.81);Difficulty in walking, not elsewhere classified (R26.2);Dizziness and giddiness (R42);Hemiplegia and hemiparesis Hemiplegia - Right/Left: Left Hemiplegia - caused by: Cerebral infarction     Time: 1025-1121 PT Time Calculation (min) (ACUTE ONLY): 56 min  Charges:  $Gait Training: 8-22 mins $Therapeutic Activity: 23-37 mins $Neuromuscular Re-education: 8-22 mins  Kittie Plater, PT, DPT Acute Rehabilitation Services Pager #: [phone] Office #: [phone]    Berline Lopes 07/28/2021, 1:20 PM

## 2021-07-28 NOTE — Progress Notes (Signed)
°  Transition of Care [facility]) Screening Note   Patient Details  Name: William [name] Date of Birth: 24-Jun-1940   Transition of Care [facility]) CM/SW Contact:    Pollie Friar, RN Phone Number: 07/28/2021, 4:24 PM    Transition of Care Department [facility]) has reviewed patient. We will continue to monitor patient advancement through interdisciplinary progression rounds. If new patient transition needs arise, please place a TOC consult.

## 2021-07-28 NOTE — Hospital Course (Addendum)
Patient is a 81 year old male with history of hypertension, hyperlipidemia, CVA with residual left-sided hemiplegia, carotid artery stenosis status post bilateral CEA, CKD stage IV, BPH, bladder cancer status post TURP, GERD who presented with earlier on 2/8 with vomiting, fall, difficulty following directions, worsening left-sided facial droop, difficulty in walking.  Stroke was suspected on admission.  Extensive work-up was done in the emergency department including MRI and CT which did not show any acute abnormalities.  Urinalysis was suspicious for UTI and was started on abx and was discharged to home from ED.  He returned again to ED with confusion, slurred speech, lethargy,fall.   MRI brain showed   acute perforator infarct in the left basal ganglia and overlyingccorona radiata. Neurology consulted and was admitted for stroke work-up.PT/OT recommending CIR on discharge

## 2021-07-28 NOTE — Plan of Care (Signed)
°  Problem: Education: Goal: Knowledge of General Education information will improve Description: Including pain rating scale, medication(s)/side effects and non-pharmacologic comfort measures Outcome: Progressing   Problem: Activity: Goal: Risk for activity intolerance will decrease Outcome: Progressing   Problem: Nutrition: Goal: Adequate nutrition will be maintained Outcome: Progressing   Problem: Safety: Goal: Ability to remain free from injury will improve Outcome: Progressing   Problem: Education: Goal: Knowledge of disease or condition will improve Outcome: Progressing Goal: Knowledge of patient specific risk factors will improve (INDIVIDUALIZE FOR PATIENT) Outcome: Progressing

## 2021-07-28 NOTE — Progress Notes (Addendum)
STROKE TEAM PROGRESS NOTE   SUBJECTIVE (INTERVAL HISTORY) His son and daughter are at the bedside.  Patient is sitting up in chair. NAEON. Overall his condition is gradually improving.  Daughter reports that patient has had improvements in mentation as well as in lower extremity strength.  CTH with acute infarct at L basal ganglia and corona radiata as well as remote R MCA infarcts. MRI redemonstrates the same. MRA with no large vessel occlusions but severe distal L M1 MCA stenosis and moderate R ICA stenosis.   Patient's son and daughter in the room and I had a long discussion with them about his stroke and needs for rehabilitation and answered questions  OBJECTIVE CBC:  Recent Labs  Lab 07/22/21 [phone] 07/22/21 0653 07/27/21 0307 07/27/21 1619 07/28/21 0435  WBC 7.3  --  9.8  --  10.7*  NEUTROABS 4.1  --  7.9*  --   --   HGB 11.2*   < > 11.5* 11.0* 9.4*  HCT 34.6*   < > 34.5* 32.8* 28.8*  MCV 98.3  --  98.3  --  99.3  PLT 265  --  244  --  187   < > = values in this interval not displayed.   Basic Metabolic Panel:  Recent Labs  Lab 07/27/21 0307 07/28/21 0435  NA 141 141  K 4.2 4.0  CL 106 110  CO2 24 22  GLUCOSE 213* 117*  BUN 32* 33*  CREATININE 2.47* 2.50*  CALCIUM 9.1 9.0   Lipid Panel:  Recent Labs  Lab 07/27/21 0717  CHOL 209*  TRIG 73  HDL 69  CHOLHDL 3.0  VLDL 15  LDLCALC 125*   HgbA1c:  Recent Labs  Lab 07/27/21 0307  HGBA1C 5.3   Urine Drug Screen:  Recent Labs  Lab 07/22/21 0728  LABOPIA NONE DETECTED  COCAINSCRNUR NONE DETECTED  LABBENZ POSITIVE*  AMPHETMU NONE DETECTED  THCU NONE DETECTED  LABBARB NONE DETECTED    Alcohol Level  Recent Labs  Lab 07/22/21 0633  ETH <10    IMAGING past 24 hours NM Hepatobiliary Liver Func  Result Date: 07/27/2021 CLINICAL DATA:  Right upper quadrant abdominal pain, with sonographic Murphy sign, gallstones, and gallbladder wall thickening on ultrasound. EXAM: NUCLEAR MEDICINE HEPATOBILIARY  IMAGING TECHNIQUE: Sequential images of the abdomen were obtained out to 60 minutes following intravenous administration of radiopharmaceutical. RADIOPHARMACEUTICALS:  5.3 mCi Tc-55m  Choletec IV COMPARISON:  Abdominal ultrasound 07/27/2021 FINDINGS: There is satisfactory uptake of radiopharmaceutical from the blood pool. Biliary activity is visible 15 minutes. Gallbladder activity visible by 20 minutes. Consecutive images show filling of the gallbladder. Bowel activity is shown by 70 minutes. IMPRESSION: 1. Currently the cystic duct is patent, with gallbladder activity visible 20 minutes after injection. The CBD is also patent, with bowel activity visible at 70 minutes and thereafter. Electronically Signed   By: Van Clines M.D.   On: 07/27/2021 15:07   EEG adult  Result Date: 07/27/2021 Greta Doom, MD     07/28/2021  9:13 AM History: 81 yo M being evaluated for encephalopathy Sedation: none Technique: This EEG was acquired with electrodes placed according to the International 10-20 electrode system (including Fp1, Fp2, F3, F4, C3, C4, P3, P4, O1, O2, T3, T4, T5, T6, A1, A2, Fz, Cz, Pz). The following electrodes were missing or displaced: none. Background: There is a poorly formed and poorly sustained posterior dominant rhythm achieving a maximal frequency of 9-10 Hz. In addition there is diffuse generalized irregular delta  and theta range activity seen throughout the recording. Well formed sleep is not recorded. No epileptiform discharges were seen. Photic stimulation: Physiologic driving is not performed EEG Abnormalities: 1) Generalized irregular slow activity. Clinical Interpretation: This EEG is consistent with a generalized non-specific cerebral dysfunction(encephalopathy). There was no seizure or seizure predisposition recorded on this study. Please note that lack of epileptiform activity on EEG does not preclude the possibility of epilepsy. Roland Rack, MD [facility] [phone] If 7pm- 7am, please page neurology on call as listed in [place].     PHYSICAL EXAM Temp:  [97.9 F (36.6 C)-98.6 F (37 C)] 98.4 F (36.9 C) (02/14 1127) Pulse Rate:  [61-88] 79 (02/14 1127) Resp:  [16-22] 18 (02/14 1127) BP: (116-152)/(58-72) 116/65 (02/14 1127) SpO2:  [96 %-100 %] 97 % (02/14 1127) FiO2 (%):  [21 %] 21 % (02/14 0800)  General - Well nourished, well developed, elderly Caucasian male, awake and alert in no apparent distress.  Cardiovascular - Regular rhythm and rate on telemetry.  Respiratory - No distress on room air  Mental Status -  Patient alert and oriented to person, place, and time (although reading off of board in room) Language including expression, naming, repetition, comprehension was assessed and found with improved dysarthria Fund of Knowledge poor.  Diminished attention, registration and recall.  Cranial Nerves II - XII - II - Visual field intact OU.  III, IV, VI - Extraocular movements with L-gaze preference. Will track past the midline to gaze right but unable to hold gaze. V - Facial sensation intact bilaterally. VII - Facial movement with L facial droop when smiling. VIII - Hearing intact bilaterally. X - Normal speech and palate elevation XI - Shoulder shrug weak bilaterally. Head midline XII - Tongue protrusion intact. Tremulous tongue at rest  Motor Strength - RUE 4/5 proximally and distally RLE elevates and able to hold >5 sec LUE lifts antigravity at elbow 3/5, but not at shoulder which is rated as 2/5. Grip 4/5 LLE elevates and able to hold >5 sec Bulk was normal and fasciculations were absent.   Motor Tone - Muscle tone was assessed at the appendages and was normal.  Sensory - Light touch was assessed and symmetrical in all extremities. Able to note that both legs were being touched.    Coordination - Bilateral tremor was present at rest intermittently. No tremor of outstretched hands, no asterixis. FNF  intact and rapid movements with some slowing on the R.  Gait and Station - deferred.    ASSESSMENT/PLAN Mr. EDIN KON is a 81 y.o. 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   Stroke:  left basal ganglia and corona radiata infarct  likely secondary to an embolic source Code Stroke CT head: acute infarct at L basal ganglia and corona radiata as well as remote R MCA infarcts. MRI  acute infarct at L basal ganglia and corona radiata as well as remote R MCA infarcts. MRA  severe distal L M1 MCA stenosis and moderate R ICA stenosis.  Carotid Doppler  1-39% stenosis of R ICA and no stenosis of L ICA 2D Echo LVEF 60 to 65%; mild LVH with grade 1 diastolic dysfunction TC Doppler: Pending EEG: Generalized encephalopathy, no seizures nor epileptiform activity LDL 115 HgbA1c 5.3 VTE prophylaxis - SCDs  Diet   Diet Heart Room service appropriate? Yes; Fluid consistency: Thin   aspirin 325 mg daily EC prior to admission, now on aspirin 81 mg daily and clopidogrel 75 mg daily. x 3 months then continue Plavix alone Therapy recommendations:  AIR Disposition:  Pending   Hypertension Home meds:  Norvasc 10 mg, Lisinopril-HCTZ 10-12.5 mg Stable Goal SBP <160 but gradually normalize in 5-7 days Long-term BP goal normotensive  Hyperlipidemia Home meds:  Fenofibrate 160 mg, resumed in hospital LDL 115, goal < 70 Patient with statin allergy --> myalgia. Will discuss the possibility of initiating statin with Co-Q10  Diabetes type II Assessment Home meds:  N/a HgbA1c 5.3, goal < 7.0 CBGs No results for input(s): GLUCAP in the last 72 hours.  SSI  Other Stroke Risk Factors Advanced Age >/= 39  Former cigarette smoker; quit 16 years ago ETOH use, alcohol  level <10, advised to drink no more than 1-2 drink(s) a day Substance abuse - UDS:  THC NONE DETECTED, Cocaine NONE DETECTED. Obesity, There is no height or weight on file to calculate BMI., BMI >/= 30 associated with increased stroke risk, recommend weight loss, diet and exercise as appropriate  Hx stroke/TIA Coronary artery disease  Other Active Problems Mild baseline dementia  Hospital day # 1    Rosezetta Schlatter, MD Stroke Neurology- Neuro Psych Resident 07/28/2021 1:59 PM  I have personally obtained history,examined this patient, reviewed notes, independently viewed imaging studies, participated in medical decision making and plan of care.ROS completed by me personally and pertinent positives fully documented  I have made any additions or clarifications directly to the above note. Agree with note above.  Patient has mild baseline dementia and with his recent stroke I expect him to have 1 more cognitive impairment and may not be able to live without greater supervision.  I had a long discussion with the patient's son and daughter and explained the situation to them.  Strongly encourage transfer to rehab for few weeks prior to going home.  They are agreeable.  Patient is not a candidate for angioplasty stenting given his baseline dementia.  And poor baseline function.  Greater than 50% time during this 35-minute visit was spent on counseling and coordination of care about his stroke and intracranial stenosis and discussion about plans for rehabilitation his dementia and answered questions.  Stroke team will sign off.  Kindly call for questions     Antony Contras, MD Medical Director [place] Pager: [phone] 07/28/2021 4:19 PM   To contact Stroke Continuity provider, please refer to [email or web address] After hours, contact General Neurology

## 2021-07-29 ENCOUNTER — Ambulatory Visit: Payer: PPO | Admitting: Physical Therapy

## 2021-07-29 ENCOUNTER — Encounter (HOSPITAL_COMMUNITY): Payer: Self-pay | Admitting: Physical Medicine and Rehabilitation

## 2021-07-29 ENCOUNTER — Inpatient Hospital Stay (HOSPITAL_COMMUNITY): Payer: PPO

## 2021-07-29 ENCOUNTER — Other Ambulatory Visit: Payer: Self-pay

## 2021-07-29 ENCOUNTER — Inpatient Hospital Stay (HOSPITAL_COMMUNITY)
Admission: RE | Admit: 2021-07-29 | Discharge: 2021-08-18 | DRG: 056 | Disposition: A | Discharge status: Home Health | Payer: PPO | Source: Intra-hospital | Attending: Physical Medicine and Rehabilitation | Admitting: Physical Medicine and Rehabilitation

## 2021-07-29 DIAGNOSIS — E876 Hypokalemia: Secondary | ICD-10-CM | POA: Diagnosis present

## 2021-07-29 DIAGNOSIS — R7401 Elevation of levels of liver transaminase levels: Secondary | ICD-10-CM | POA: Diagnosis present

## 2021-07-29 DIAGNOSIS — G214 Vascular parkinsonism: Secondary | ICD-10-CM | POA: Diagnosis present

## 2021-07-29 DIAGNOSIS — I6381 Other cerebral infarction due to occlusion or stenosis of small artery: Secondary | ICD-10-CM | POA: Diagnosis present

## 2021-07-29 DIAGNOSIS — G4701 Insomnia due to medical condition: Secondary | ICD-10-CM | POA: Diagnosis not present

## 2021-07-29 DIAGNOSIS — G9340 Encephalopathy, unspecified: Secondary | ICD-10-CM | POA: Diagnosis not present

## 2021-07-29 DIAGNOSIS — G4700 Insomnia, unspecified: Secondary | ICD-10-CM | POA: Diagnosis not present

## 2021-07-29 DIAGNOSIS — G20 Parkinson's disease: Secondary | ICD-10-CM | POA: Diagnosis not present

## 2021-07-29 DIAGNOSIS — Z8249 Family history of ischemic heart disease and other diseases of the circulatory system: Secondary | ICD-10-CM | POA: Diagnosis not present

## 2021-07-29 DIAGNOSIS — I69391 Dysphagia following cerebral infarction: Secondary | ICD-10-CM

## 2021-07-29 DIAGNOSIS — Z87891 Personal history of nicotine dependence: Secondary | ICD-10-CM | POA: Diagnosis not present

## 2021-07-29 DIAGNOSIS — E663 Overweight: Secondary | ICD-10-CM | POA: Diagnosis present

## 2021-07-29 DIAGNOSIS — N184 Chronic kidney disease, stage 4 (severe): Secondary | ICD-10-CM | POA: Diagnosis not present

## 2021-07-29 DIAGNOSIS — J9811 Atelectasis: Secondary | ICD-10-CM | POA: Diagnosis not present

## 2021-07-29 DIAGNOSIS — Z8551 Personal history of malignant neoplasm of bladder: Secondary | ICD-10-CM | POA: Diagnosis not present

## 2021-07-29 DIAGNOSIS — R7303 Prediabetes: Secondary | ICD-10-CM

## 2021-07-29 DIAGNOSIS — Z818 Family history of other mental and behavioral disorders: Secondary | ICD-10-CM

## 2021-07-29 DIAGNOSIS — Z85828 Personal history of other malignant neoplasm of skin: Secondary | ICD-10-CM

## 2021-07-29 DIAGNOSIS — F03A18 Unspecified dementia, mild, with other behavioral disturbance: Secondary | ICD-10-CM | POA: Diagnosis present

## 2021-07-29 DIAGNOSIS — D631 Anemia in chronic kidney disease: Secondary | ICD-10-CM | POA: Diagnosis not present

## 2021-07-29 DIAGNOSIS — R1312 Dysphagia, oropharyngeal phase: Secondary | ICD-10-CM | POA: Diagnosis present

## 2021-07-29 DIAGNOSIS — N179 Acute kidney failure, unspecified: Secondary | ICD-10-CM

## 2021-07-29 DIAGNOSIS — K8020 Calculus of gallbladder without cholecystitis without obstruction: Secondary | ICD-10-CM | POA: Diagnosis present

## 2021-07-29 DIAGNOSIS — S32029D Unspecified fracture of second lumbar vertebra, subsequent encounter for fracture with routine healing: Secondary | ICD-10-CM

## 2021-07-29 DIAGNOSIS — R451 Restlessness and agitation: Secondary | ICD-10-CM | POA: Diagnosis not present

## 2021-07-29 DIAGNOSIS — I951 Orthostatic hypotension: Secondary | ICD-10-CM | POA: Diagnosis not present

## 2021-07-29 DIAGNOSIS — E669 Obesity, unspecified: Secondary | ICD-10-CM | POA: Diagnosis present

## 2021-07-29 DIAGNOSIS — I6523 Occlusion and stenosis of bilateral carotid arteries: Secondary | ICD-10-CM | POA: Diagnosis present

## 2021-07-29 DIAGNOSIS — G9389 Other specified disorders of brain: Secondary | ICD-10-CM | POA: Diagnosis not present

## 2021-07-29 DIAGNOSIS — K219 Gastro-esophageal reflux disease without esophagitis: Secondary | ICD-10-CM | POA: Diagnosis present

## 2021-07-29 DIAGNOSIS — E785 Hyperlipidemia, unspecified: Secondary | ICD-10-CM | POA: Diagnosis present

## 2021-07-29 DIAGNOSIS — R4587 Impulsiveness: Secondary | ICD-10-CM | POA: Diagnosis present

## 2021-07-29 DIAGNOSIS — I69354 Hemiplegia and hemiparesis following cerebral infarction affecting left non-dominant side: Principal | ICD-10-CM

## 2021-07-29 DIAGNOSIS — Z9079 Acquired absence of other genital organ(s): Secondary | ICD-10-CM

## 2021-07-29 DIAGNOSIS — Z91030 Bee allergy status: Secondary | ICD-10-CM

## 2021-07-29 DIAGNOSIS — S32039D Unspecified fracture of third lumbar vertebra, subsequent encounter for fracture with routine healing: Secondary | ICD-10-CM

## 2021-07-29 DIAGNOSIS — J90 Pleural effusion, not elsewhere classified: Secondary | ICD-10-CM | POA: Diagnosis not present

## 2021-07-29 DIAGNOSIS — I619 Nontraumatic intracerebral hemorrhage, unspecified: Secondary | ICD-10-CM | POA: Diagnosis not present

## 2021-07-29 DIAGNOSIS — I129 Hypertensive chronic kidney disease with stage 1 through stage 4 chronic kidney disease, or unspecified chronic kidney disease: Secondary | ICD-10-CM | POA: Diagnosis not present

## 2021-07-29 DIAGNOSIS — Z888 Allergy status to other drugs, medicaments and biological substances status: Secondary | ICD-10-CM

## 2021-07-29 DIAGNOSIS — I639 Cerebral infarction, unspecified: Secondary | ICD-10-CM | POA: Diagnosis not present

## 2021-07-29 DIAGNOSIS — Z9102 Food additives allergy status: Secondary | ICD-10-CM | POA: Diagnosis not present

## 2021-07-29 DIAGNOSIS — L299 Pruritus, unspecified: Secondary | ICD-10-CM | POA: Diagnosis not present

## 2021-07-29 DIAGNOSIS — I2510 Atherosclerotic heart disease of native coronary artery without angina pectoris: Secondary | ICD-10-CM | POA: Diagnosis present

## 2021-07-29 DIAGNOSIS — I69318 Other symptoms and signs involving cognitive functions following cerebral infarction: Secondary | ICD-10-CM

## 2021-07-29 DIAGNOSIS — R251 Tremor, unspecified: Secondary | ICD-10-CM | POA: Diagnosis not present

## 2021-07-29 DIAGNOSIS — Z7902 Long term (current) use of antithrombotics/antiplatelets: Secondary | ICD-10-CM

## 2021-07-29 DIAGNOSIS — R1310 Dysphagia, unspecified: Secondary | ICD-10-CM | POA: Diagnosis not present

## 2021-07-29 DIAGNOSIS — S32019D Unspecified fracture of first lumbar vertebra, subsequent encounter for fracture with routine healing: Secondary | ICD-10-CM

## 2021-07-29 DIAGNOSIS — W19XXXD Unspecified fall, subsequent encounter: Secondary | ICD-10-CM | POA: Diagnosis present

## 2021-07-29 DIAGNOSIS — Z79899 Other long term (current) drug therapy: Secondary | ICD-10-CM

## 2021-07-29 DIAGNOSIS — G9341 Metabolic encephalopathy: Secondary | ICD-10-CM | POA: Diagnosis not present

## 2021-07-29 DIAGNOSIS — G9349 Other encephalopathy: Secondary | ICD-10-CM | POA: Diagnosis not present

## 2021-07-29 DIAGNOSIS — S2249XA Multiple fractures of ribs, unspecified side, initial encounter for closed fracture: Secondary | ICD-10-CM | POA: Diagnosis not present

## 2021-07-29 DIAGNOSIS — Z8673 Personal history of transient ischemic attack (TIA), and cerebral infarction without residual deficits: Secondary | ICD-10-CM | POA: Diagnosis not present

## 2021-07-29 DIAGNOSIS — Z91040 Latex allergy status: Secondary | ICD-10-CM

## 2021-07-29 DIAGNOSIS — I69322 Dysarthria following cerebral infarction: Secondary | ICD-10-CM

## 2021-07-29 DIAGNOSIS — M47816 Spondylosis without myelopathy or radiculopathy, lumbar region: Secondary | ICD-10-CM | POA: Diagnosis not present

## 2021-07-29 DIAGNOSIS — S2242XD Multiple fractures of ribs, left side, subsequent encounter for fracture with routine healing: Secondary | ICD-10-CM

## 2021-07-29 DIAGNOSIS — G250 Essential tremor: Secondary | ICD-10-CM | POA: Diagnosis present

## 2021-07-29 DIAGNOSIS — I679 Cerebrovascular disease, unspecified: Secondary | ICD-10-CM | POA: Diagnosis not present

## 2021-07-29 DIAGNOSIS — Z7982 Long term (current) use of aspirin: Secondary | ICD-10-CM | POA: Diagnosis not present

## 2021-07-29 DIAGNOSIS — I69392 Facial weakness following cerebral infarction: Secondary | ICD-10-CM

## 2021-07-29 DIAGNOSIS — Z6829 Body mass index (BMI) 29.0-29.9, adult: Secondary | ICD-10-CM

## 2021-07-29 DIAGNOSIS — F03A3 Unspecified dementia, mild, with mood disturbance: Secondary | ICD-10-CM | POA: Diagnosis not present

## 2021-07-29 DIAGNOSIS — R32 Unspecified urinary incontinence: Secondary | ICD-10-CM | POA: Diagnosis present

## 2021-07-29 DIAGNOSIS — S22089D Unspecified fracture of T11-T12 vertebra, subsequent encounter for fracture with routine healing: Secondary | ICD-10-CM

## 2021-07-29 DIAGNOSIS — N400 Enlarged prostate without lower urinary tract symptoms: Secondary | ICD-10-CM | POA: Diagnosis present

## 2021-07-29 DIAGNOSIS — Z713 Dietary counseling and surveillance: Secondary | ICD-10-CM

## 2021-07-29 LAB — CBC WITH DIFFERENTIAL/PLATELET
Abs Immature Granulocytes: 0.06 10*3/uL (ref 0.00–0.07)
Basophils Absolute: 0 10*3/uL (ref 0.0–0.1)
Basophils Relative: 0 %
Eosinophils Absolute: 0.3 10*3/uL (ref 0.0–0.5)
Eosinophils Relative: 3 %
HCT: 26.1 % — ABNORMAL LOW (ref 39.0–52.0)
Hemoglobin: 8.9 g/dL — ABNORMAL LOW (ref 13.0–17.0)
Immature Granulocytes: 1 %
Lymphocytes Relative: 19 %
Lymphs Abs: 1.9 10*3/uL (ref 0.7–4.0)
MCH: 32.8 pg (ref 26.0–34.0)
MCHC: 34.1 g/dL (ref 30.0–36.0)
MCV: 96.3 fL (ref 80.0–100.0)
Monocytes Absolute: 0.9 10*3/uL (ref 0.1–1.0)
Monocytes Relative: 9 %
Neutro Abs: 6.8 10*3/uL (ref 1.7–7.7)
Neutrophils Relative %: 68 %
Platelets: 168 10*3/uL (ref 150–400)
RBC: 2.71 MIL/uL — ABNORMAL LOW (ref 4.22–5.81)
RDW: 13.4 % (ref 11.5–15.5)
WBC: 10 10*3/uL (ref 4.0–10.5)
nRBC: 0 % (ref 0.0–0.2)

## 2021-07-29 LAB — BASIC METABOLIC PANEL
Anion gap: 6 (ref 5–15)
BUN: 26 mg/dL — ABNORMAL HIGH (ref 8–23)
CO2: 21 mmol/L — ABNORMAL LOW (ref 22–32)
Calcium: 8.4 mg/dL — ABNORMAL LOW (ref 8.9–10.3)
Chloride: 112 mmol/L — ABNORMAL HIGH (ref 98–111)
Creatinine, Ser: 2.02 mg/dL — ABNORMAL HIGH (ref 0.61–1.24)
GFR, Estimated: 33 mL/min — ABNORMAL LOW (ref >60)
Glucose, Bld: 106 mg/dL — ABNORMAL HIGH (ref 70–99)
Potassium: 3.8 mmol/L (ref 3.5–5.1)
Sodium: 139 mmol/L (ref 135–145)

## 2021-07-29 LAB — GLUCOSE, CAPILLARY: Glucose-Capillary: 116 mg/dL — ABNORMAL HIGH (ref 70–99)

## 2021-07-29 MED ORDER — GUAIFENESIN-DM 100-10 MG/5ML PO SYRP: 5.0000 mL | ORAL_SOLUTION | Freq: Four times a day (QID) | ORAL | Status: DC | PRN

## 2021-07-29 MED ORDER — HYDROCORTISONE VALERATE 0.2 % EX CREA: TOPICAL_CREAM | Freq: Every day | CUTANEOUS | 0 refills | Status: DC | PRN

## 2021-07-29 MED ORDER — SORBITOL 70 % SOLN
30.0000 mL | Freq: Every day | Status: DC | PRN
Start: 2021-07-29 — End: 2021-08-18
  Administered 2021-08-05: 30 mL via ORAL
  Filled 2021-07-29 (×2): qty 30

## 2021-07-29 MED ORDER — LIDOCAINE HCL URETHRAL/MUCOSAL 2 % EX GEL: 1.0000 "application " | CUTANEOUS | Status: DC | PRN

## 2021-07-29 MED ORDER — LIDOCAINE 5 % EX PTCH
1.0000 | MEDICATED_PATCH | CUTANEOUS | Status: DC
  Administered 2021-07-30 – 2021-08-18 (×20): 1 via TRANSDERMAL
  Filled 2021-07-29 (×20): qty 1

## 2021-07-29 MED ORDER — HYDROCORTISONE VALERATE 0.2 % EX CREA
TOPICAL_CREAM | Freq: Every day | CUTANEOUS | Status: DC | PRN
  Filled 2021-07-29: qty 15

## 2021-07-29 MED ORDER — POLYETHYLENE GLYCOL 3350 17 G PO PACK
17.0000 g | PACK | Freq: Every day | ORAL | Status: DC | PRN
  Administered 2021-08-13: 17 g via ORAL
  Filled 2021-07-29 (×2): qty 1

## 2021-07-29 MED ORDER — FENOFIBRATE 160 MG PO TABS
160.0000 mg | ORAL_TABLET | Freq: Every day | ORAL | Status: DC
  Administered 2021-07-29 – 2021-08-17 (×20): 160 mg via ORAL
  Filled 2021-07-29 (×20): qty 1

## 2021-07-29 MED ORDER — HYDRALAZINE HCL 25 MG PO TABS
25.0000 mg | ORAL_TABLET | Freq: Three times a day (TID) | ORAL | Status: DC
  Administered 2021-07-29 – 2021-08-18 (×59): 25 mg via ORAL
  Filled 2021-07-29 (×60): qty 1

## 2021-07-29 MED ORDER — ASPIRIN 81 MG PO TBEC: 81.0000 mg | DELAYED_RELEASE_TABLET | Freq: Every day | ORAL | 2 refills | Status: DC

## 2021-07-29 MED ORDER — HYDRALAZINE HCL 25 MG PO TABS: 25.0000 mg | ORAL_TABLET | Freq: Three times a day (TID) | ORAL | Status: DC

## 2021-07-29 MED ORDER — LIDOCAINE 5 % EX PTCH: 1.0000 | MEDICATED_PATCH | Freq: Two times a day (BID) | CUTANEOUS | 0 refills | Status: DC

## 2021-07-29 MED ORDER — LORAZEPAM 1 MG PO TABS
1.0000 mg | ORAL_TABLET | Freq: Every day | ORAL | Status: DC
  Administered 2021-07-29 – 2021-08-17 (×20): 1 mg via ORAL
  Filled 2021-07-29 (×20): qty 1

## 2021-07-29 MED ORDER — FLEET ENEMA 7-19 GM/118ML RE ENEM: 1.0000 | ENEMA | Freq: Once | RECTAL | Status: DC | PRN

## 2021-07-29 MED ORDER — CLOPIDOGREL BISULFATE 75 MG PO TABS: 75.0000 mg | ORAL_TABLET | Freq: Every day | ORAL | Status: DC

## 2021-07-29 MED ORDER — AMLODIPINE BESYLATE 10 MG PO TABS
10.0000 mg | ORAL_TABLET | Freq: Every morning | ORAL | Status: DC
  Administered 2021-07-30 – 2021-08-18 (×20): 10 mg via ORAL
  Filled 2021-07-29 (×18): qty 1

## 2021-07-29 MED ORDER — LORAZEPAM 1 MG PO TABS: 1.0000 mg | ORAL_TABLET | Freq: Every day | ORAL | 0 refills | Status: DC

## 2021-07-29 MED ORDER — ALUM & MAG HYDROXIDE-SIMETH 200-200-20 MG/5ML PO SUSP: 30.0000 mL | ORAL | Status: DC | PRN

## 2021-07-29 MED ORDER — ASPIRIN EC 81 MG PO TBEC
81.0000 mg | DELAYED_RELEASE_TABLET | Freq: Every day | ORAL | Status: DC
  Administered 2021-07-30 – 2021-08-18 (×20): 81 mg via ORAL
  Filled 2021-07-29 (×20): qty 1

## 2021-07-29 MED ORDER — PANTOPRAZOLE SODIUM 40 MG IV SOLR
40.0000 mg | Freq: Two times a day (BID) | INTRAVENOUS | Status: DC
  Administered 2021-07-29: 40 mg via INTRAVENOUS
  Filled 2021-07-29: qty 10

## 2021-07-29 MED ORDER — ACETAMINOPHEN 325 MG PO TABS
325.0000 mg | ORAL_TABLET | ORAL | Status: DC | PRN
  Administered 2021-08-15 – 2021-08-16 (×2): 650 mg via ORAL
  Filled 2021-07-29 (×2): qty 2

## 2021-07-29 MED ORDER — VITAMIN D 25 MCG (1000 UNIT) PO TABS
2000.0000 [IU] | ORAL_TABLET | Freq: Every day | ORAL | Status: DC
  Administered 2021-07-29 – 2021-08-18 (×21): 2000 [IU] via ORAL
  Filled 2021-07-29 (×21): qty 2

## 2021-07-29 MED ORDER — TRAMADOL HCL 50 MG PO TABS
50.0000 mg | ORAL_TABLET | Freq: Two times a day (BID) | ORAL | Status: DC | PRN
  Administered 2021-07-30 – 2021-08-17 (×3): 50 mg via ORAL
  Filled 2021-07-29 (×3): qty 1

## 2021-07-29 MED ORDER — CLOPIDOGREL BISULFATE 75 MG PO TABS
75.0000 mg | ORAL_TABLET | Freq: Every day | ORAL | Status: DC
  Administered 2021-07-30 – 2021-08-18 (×20): 75 mg via ORAL
  Filled 2021-07-29 (×20): qty 1

## 2021-07-29 MED ORDER — POLYETHYLENE GLYCOL 3350 17 G PO PACK
17.0000 g | PACK | Freq: Every day | ORAL | 0 refills | Status: DC
Start: 2021-07-29 — End: 2021-08-18

## 2021-07-29 MED ORDER — METHOCARBAMOL 500 MG PO TABS
500.0000 mg | ORAL_TABLET | Freq: Four times a day (QID) | ORAL | Status: DC | PRN
Start: 2021-07-29 — End: 2021-08-18
  Administered 2021-07-29 – 2021-08-17 (×5): 500 mg via ORAL
  Filled 2021-07-29 (×5): qty 1

## 2021-07-29 MED ORDER — CARBAMAZEPINE ER 100 MG PO TB12
100.0000 mg | ORAL_TABLET | Freq: Every morning | ORAL | Status: DC
  Administered 2021-07-30 – 2021-08-18 (×20): 100 mg via ORAL
  Filled 2021-07-29 (×21): qty 1

## 2021-07-29 MED ORDER — LORAZEPAM 0.5 MG PO TABS
0.5000 mg | ORAL_TABLET | Freq: Every day | ORAL | Status: DC
Start: 2021-07-30 — End: 2021-08-01
  Administered 2021-07-30 – 2021-08-01 (×3): 0.5 mg via ORAL
  Filled 2021-07-29 (×3): qty 1

## 2021-07-29 MED ORDER — LORAZEPAM 0.5 MG PO TABS: 0.5000 mg | ORAL_TABLET | Freq: Every day | ORAL | 0 refills | Status: DC

## 2021-07-29 MED ORDER — CEFAZOLIN SODIUM-DEXTROSE 2-4 GM/100ML-% IV SOLN
2.0000 g | INTRAVENOUS | Status: DC
  Filled 2021-07-29: qty 100

## 2021-07-29 MED ORDER — OXYCODONE-ACETAMINOPHEN 7.5-325 MG PO TABS: 1.0000 | ORAL_TABLET | Freq: Four times a day (QID) | ORAL | 0 refills | Status: DC | PRN

## 2021-07-29 MED ORDER — CARBAMAZEPINE ER 200 MG PO TB12
200.0000 mg | ORAL_TABLET | Freq: Every day | ORAL | Status: DC
  Administered 2021-07-29 – 2021-08-17 (×20): 200 mg via ORAL
  Filled 2021-07-29 (×20): qty 1

## 2021-07-29 MED ORDER — PROCHLORPERAZINE EDISYLATE 10 MG/2ML IJ SOLN
5.0000 mg | Freq: Four times a day (QID) | INTRAMUSCULAR | Status: DC | PRN
  Filled 2021-07-29 (×2): qty 2

## 2021-07-29 MED ORDER — PANTOPRAZOLE SODIUM 40 MG PO TBEC: 40.0000 mg | DELAYED_RELEASE_TABLET | Freq: Every day | ORAL | 1 refills | Status: DC

## 2021-07-29 MED ORDER — TAMSULOSIN HCL 0.4 MG PO CAPS
0.4000 mg | ORAL_CAPSULE | Freq: Every day | ORAL | Status: DC
  Administered 2021-07-29: 0.4 mg via ORAL
  Filled 2021-07-29: qty 1

## 2021-07-29 MED ORDER — PROCHLORPERAZINE MALEATE 5 MG PO TABS
5.0000 mg | ORAL_TABLET | Freq: Four times a day (QID) | ORAL | Status: DC | PRN
  Administered 2021-07-30: 5 mg via ORAL
  Filled 2021-07-29: qty 2

## 2021-07-29 MED ORDER — PROCHLORPERAZINE 25 MG RE SUPP: 12.5000 mg | Freq: Four times a day (QID) | RECTAL | Status: DC | PRN

## 2021-07-29 NOTE — Progress Notes (Signed)
Admitted to the unit with no complications. Patient oriented to assigned room Patient accompanied by daughter. Admission complete

## 2021-07-29 NOTE — H&P (Addendum)
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  MRI and CT were negative.  Since was concern for UTI and he was started on cefdinir and discharged home.  The following day on 2/9, he presented to [facility] with worsening confusion and slurred speech, as well as vomiting of coffee-ground type emesis.  His wife found him on the bathroom floor and he was lethargic and not responding as usual.  CT of the head and cervical spine was significant for an acute perforator infarct at the left basal ganglia and corona radiata.  Neurology consultation was obtained.  Large remote right MCA infarction involving most of that territory was again noted.  The scan of the abdomen and pelvis performed secondary to upper abdominal pain and tenderness.  Findings included thickened gallbladder wall, 11th and 12th rib fractures and T12-L3 lateral transverse processes fractures.  Ultrasound of the right upper quadrant revealed gallstones.  General surgery consulted, HIDA scan performed negative for acute cholecystitis.  Tolerating heart healthy diet with thin liquids.  The patient is observed to have residual left-sided weakness from previous stroke as well as overall generalized weakness.  The patient requires inpatient medicine and rehabilitation evaluations and services for ongoing dysfunction secondary to basal ganglia infarct.  Relevant medical history includes history of bladder cancer, carotid artery disease status post bilateral carotid endarterectomies, chronic kidney disease stage III, hypertension and hyperlipidemia.  He is maintained on aspirin.  He is also on carbamazepine for mood.  No history  of seizure activity.  Plavix initiated.  Continue Plavix and aspirin for 3 months followed by Plavix alone. Wife is very supportive and is able to provide 24/7 at home.   Hemoglobin A1c 5.3 LDL equal 125 Echocardiogram showed ejection fraction of approximately 60 to 65% with grade 1 diastolic dysfunction EEG without seizure activity  Review of Systems  Constitutional: Negative.   HENT: Negative.    Eyes: Negative.   Cardiovascular: Negative.   Gastrointestinal:  Positive for abdominal pain.  Genitourinary: Negative.   Skin: Negative.   Neurological:  Positive for focal weakness and weakness.  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   Mild, related to stroke history   Nocturia    Renal insufficiency 06/25/2013   S/P carotid endarterectomy    BILATERAL ICA--  PATENT PER DUPLEX  05-19-2012   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   Right calf   Urinary frequency    Vitamin D deficiency    Past Surgical History:  Procedure  Laterality Date   APPENDECTOMY  AS CHILD   CARDIOVASCULAR STRESS TEST  03-27-2012  DR CRENSHAW   LOW RISK LEXISCAN STUDY-- PROBABLE NORMAL PERFUSION AND SOFT TISSUE ATTENUATION/  NO ISCHEMIA/ EF 51%   CAROTID ENDARTERECTOMY Bilateral LEFT  11-12-2008  DR GREG HAYES   RIGHT ICA  2006  ([facility])   CYSTOSCOPY W/ RETROGRADES Bilateral 06/22/2021    Procedure: CYSTOSCOPY WITH RETROGRADE PYELOGRAM;  Surgeon: Franchot Gallo, MD;  Location: [facility];  Service: Urology;  Laterality: Bilateral;   CYSTOSCOPY WITH LITHOLAPAXY N/A 02/26/2013   Procedure: CYSTOSCOPY WITH LITHOLAPAXY;  Surgeon: Franchot Gallo, MD;  Location: [facility];  Service: Urology;  Laterality: N/A;   EYE SURGERY  Jan. 2016   cataract surgery both eyes   INGUINAL HERNIA REPAIR Right 11-08-2006   IR KYPHO EA ADDL LEVEL THORACIC OR LUMBAR  02/12/2021   IR RADIOLOGIST EVAL & MGMT  02/18/2021   MASS EXCISION N/A 03/03/2016   Procedure: EXCISION OF BACK  MASS;  Surgeon: Stark Klein, MD;  Location: [facility];  Service: General;  Laterality: N/A;   MOHS SURGERY Left 1/ 2016   Dr Nevada Crane-- Basal cell   PROSTATE SURGERY     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-C N/A 06/22/2021   Procedure: TRANSURETHRAL RESECTION OF BLADDER TUMOR;  Surgeon: Franchot Gallo, MD;  Location: [facility];  Service: Urology;  Laterality: N/A;   TRANSURETHRAL RESECTION OF PROSTATE N/A 02/26/2013   Procedure: TRANSURETHRAL RESECTION OF THE PROSTATE WITH GYRUS INSTRUMENTS;  Surgeon: Franchot Gallo, MD;  Location: [facility];  Service: Urology;  Laterality: N/A;   TRANSURETHRAL RESECTION OF PROSTATE N/A 06/22/2021   Procedure: TRANSURETHRAL RESECTION OF THE PROSTATE (TURP);  Surgeon: Franchot Gallo, MD;  Location: [facility];  Service: Urology;  Laterality: N/A;   Family History  Problem Relation Age of Onset   Heart disease Mother        CHF   Bipolar disorder Mother    Heart disease Father        CHF   Social History:  reports that he quit smoking about 16 years ago. His smoking use included cigarettes. He has a 80.00 pack-year smoking history. He has never used smokeless tobacco. He reports current alcohol use. He reports that he does not use drugs. Allergies:  Allergies  Allergen  Reactions   Bee Venom Anaphylaxis   Strawberry Extract Hives   Latex Itching   Adhesive [Tape] Other (See Comments)    BLISTER   Statins Other (See Comments)    myalgias   Medications Prior to Admission  Medication Sig Dispense Refill   acetaminophen (TYLENOL) 500 MG tablet Take 500 mg by mouth every 6 (six) hours as needed for headache (pain).     amLODipine (NORVASC) 10 MG tablet Take 1 tablet by mouth daily (Patient taking differently: Take 10 mg by mouth every morning.) 90 tablet 3   [START ON 07/30/2021] aspirin EC 81 MG EC tablet Take 1 tablet (81 mg total) by mouth daily. Swallow whole. 30 tablet 2   Aspirin Effervescent (ALKA-SELTZER PO) Take 2 tablets by mouth daily as needed (indigestion/heartburn). chewable     carbamazepine (TEGRETOL XR) 100 MG 12 hr tablet TAKE 1 TABLET BY MOUTH EVERY MORNING AND 2 AT BEDTIME (Patient taking differently: 100-200 mg See admin instructions. Take one tablet (100 mg) by mouth every morning and two tablets (200 mg) at bedtime) 270 tablet 1   Cholecalciferol (VITAMIN D)  50 MCG (2000 UT) CAPS Take 2,000 Units by mouth every morning.     [START ON 07/30/2021] clopidogrel (PLAVIX) 75 MG tablet Take 1 tablet (75 mg total) by mouth daily.     Eszopiclone 3 MG TABS TAKE 1 TABLET BY MOUTH IMMEDIATELY BEFORE BEDTIME AS NEEDED (Patient taking differently: 3 mg at bedtime.) 90 tablet 2   fenofibrate 160 MG tablet Take 1 tablet (160 mg total) by mouth daily. (Patient taking differently: Take 160 mg by mouth at bedtime.) 90 tablet 1   Flaxseed, Linseed, (FLAXSEED OIL) 1200 MG CAPS Take 1,200 mg by mouth 2 (two) times daily.     fluticasone (CUTIVATE) 3.32 % cream 1 application daily as needed (psoriasis).  3   hydrALAZINE (APRESOLINE) 25 MG tablet Take 1 tablet (25 mg total) by mouth every 8 (eight) hours.     hydrocortisone valerate cream (WESTCORT) 0.2 % Apply topically daily as needed (to areas of psoriasis). 45 g 0   lidocaine (LIDODERM) 5 % Place 1 patch onto  the skin every 12 (twelve) hours. Remove & Discard patch within 12 hours or as directed by MD 10 patch 0   [START ON 07/30/2021] LORazepam (ATIVAN) 0.5 MG tablet Take 1 tablet (0.5 mg total) by mouth daily. 30 tablet 0   LORazepam (ATIVAN) 1 MG tablet Take 1 tablet (1 mg total) by mouth at bedtime. 30 tablet 0   Multiple Vitamins-Minerals (ADULT ONE DAILY GUMMIES) CHEW Chew 1 tablet by mouth every morning.     ondansetron (ZOFRAN ODT) 4 MG disintegrating tablet Take 1 tablet (4 mg total) by mouth every 8 (eight) hours as needed for nausea or vomiting. (Patient not taking: Reported on 07/27/2021) 20 tablet 0   oxyCODONE-acetaminophen (PERCOCET) 7.5-325 MG tablet Take 1 tablet by mouth every 6 (six) hours as needed for severe pain. 20 tablet 0   pantoprazole (PROTONIX) 40 MG tablet Take 1 tablet (40 mg total) by mouth daily. 30 tablet 1   polyethylene glycol (MIRALAX) 17 g packet Take 17 g by mouth daily. 14 each 0   sildenafil (VIAGRA) 100 MG tablet Take 1 tablet (100 mg total) by mouth as needed up to once daily. (Patient not taking: Reported on 07/27/2021) 10 tablet 11   tamsulosin (FLOMAX) 0.4 MG CAPS capsule Take 1 capsule (0.4 mg total) by mouth 2 (two) times daily. (Patient taking differently: Take 0.4 mg by mouth at bedtime.) 60 capsule 11   triamcinolone cream (KENALOG) 0.1 % Apply 1 application topically 2 (two) times daily. (Patient taking differently: Apply 1 application topically 2 (two) times daily as needed (psoriasis).) 30 g 0   Home: Home Living Family/patient expects to be discharged to:: Private residence Living Arrangements: Spouse/significant other Available Help at Discharge: Family Type of Home: House Home Access: Stairs to enter Technical brewer of Steps: 1 Home Layout: One level Bathroom Shower/Tub: Chiropodist: [place] - single point, Grab bars - tub/shower Additional Comments: spouse works 20 hrs/week  Lives With: Spouse    Functional History: Prior Function Prior Level of Function : Needs assist Physical Assist : Mobility (physical), ADLs (physical) Mobility (physical): Stairs, Transfers ADLs (physical): Bathing, Dressing Mobility Comments: intermittently uses cane ADLs Comments: standing in shower, spouse assists with washing his hair, spouse assists with dressing; spouse assist with driving, IADLs   Functional Status:  Mobility: Bed Mobility Overal bed mobility: Needs Assistance Bed Mobility: Supine to Sit Rolling: Max assist, +2 for physical assistance Supine to sit: Mod assist, +2  for physical assistance Sit to supine: Max assist, +2 for physical assistance, +2 for safety/equipment General bed mobility comments: mod +2 for LE progression to EOB, trunk elevation. Increased time, step-by-step sequencing cues Transfers Overall transfer level: Needs assistance Equipment used: 2 person hand held assist Transfers: Sit to/from Stand Sit to Stand: Min assist, +2 safety/equipment Bed to/from chair/wheelchair/BSC transfer type:: Step pivot Step pivot transfers: Mod assist General transfer comment: light assist to rise and steady, repeated sit<>stands from EOB x5 and x2 from chair in front of sink. Ambulation/Gait Ambulation/Gait assistance: Min assist, +2 safety/equipment Gait Distance (Feet): 65 Feet Assistive device: 2 person hand held assist Gait Pattern/deviations: Step-through pattern, Decreased stride length, Trunk flexed, Shuffle General Gait Details: assist to steady, cues for upright posture, hallway navigation. Gait velocity: decr   ADL: ADL Overall ADL's : Needs assistance/impaired Grooming: Sitting, Moderate assistance Upper Body Dressing : Maximal assistance, Sitting Lower Body Dressing: Total assistance, +2 for physical assistance, +2 for safety/equipment, Sit to/from stand Toilet Transfer: Maximal assistance, +2 for physical assistance, +2 for safety/equipment Toilet Transfer Details  (indicate cue type and reason): simulated side stepping towards [facility] Toileting- Clothing Manipulation and Hygiene: Total assistance, +2 for physical assistance, +2 for safety/equipment, Bed level Toileting - Clothing Manipulation Details (indicate cue type and reason): incontinent of bladder, total assist for hygiene and linen change   Cognition: Cognition Overall Cognitive Status: Impaired/Different from baseline Orientation Level: Oriented X4 Cognition Arousal/Alertness: Awake/alert Behavior During Therapy: Flat affect Overall Cognitive Status: Impaired/Different from baseline Area of Impairment: Attention, Problem solving, Awareness, Orientation, Memory, Following commands, Safety/judgement Orientation Level: Disoriented to, Time, Situation, Person Current Attention Level: Sustained Memory: Decreased short-term memory Following Commands: Follows one step commands with increased time, Follows multi-step commands inconsistently Safety/Judgement: Decreased awareness of safety, Decreased awareness of deficits Awareness: Intellectual Problem Solving: Slow processing, Decreased initiation, Difficulty sequencing, Requires verbal cues, Requires tactile cues General Comments: pt require step-by-step cuing for all functional tasks, seems to demonstrate some apraxia with ADLs (washing face with tasks out of order).   Physical Exam: There were no vitals taken for this visit. Physical Exam Gen: no distress, normal appearing HEENT: oral mucosa pink and moist, NCAT Cardio: Reg rate Chest: normal effort, normal rate of breathing Abd: soft, non-distended Ext: no edema Psych: pleasant, normal affect Skin: intact Neuro: Alert and oriented x3. RUE 4/5, LUE 2/5 proximally and 4/5 distally, bilateral lower extremities 3/5.  Able to tolerate standing.   Results for orders placed or performed during the hospital encounter of 07/27/21 (from the past 48 hour(s))  Hemoglobin and hematocrit, blood      Status: Abnormal   Collection Time: 07/27/21  4:19 PM  Result Value Ref Range   Hemoglobin 11.0 (L) 13.0 - 17.0 g/dL   HCT 32.8 (L) 39.0 - 52.0 %    Comment: Performed at [facility], [address], [place], [place] [address]  TSH     Status: None   Collection Time: 07/27/21  4:19 PM  Result Value Ref Range   TSH 0.555 0.350 - 4.500 uIU/mL    Comment: Performed by a 3rd Generation assay with a functional sensitivity of <=0.01 uIU/mL. Performed at [facility], [place] [address], [place], [place] [address]   Comprehensive metabolic panel     Status: Abnormal   Collection Time: 07/28/21  4:35 AM  Result Value Ref Range   Sodium 141 135 - 145 mmol/L   Potassium 4.0 3.5 - 5.1 mmol/L   Chloride 110  98 - 111 mmol/L   CO2 22 22 - 32 mmol/L   Glucose, Bld 117 (H) 70 - 99 mg/dL    Comment: Glucose reference range applies only to samples taken after fasting for at least 8 hours.   BUN 33 (H) 8 - 23 mg/dL   Creatinine, Ser 2.50 (H) 0.61 - 1.24 mg/dL   Calcium 9.0 8.9 - 10.3 mg/dL   Total Protein 6.0 (L) 6.5 - 8.1 g/dL   Albumin 2.8 (L) 3.5 - 5.0 g/dL   AST 92 (H) 15 - 41 U/L   ALT 101 (H) 0 - 44 U/L   Alkaline Phosphatase 85 38 - 126 U/L   Total Bilirubin 0.7 0.3 - 1.2 mg/dL   GFR, Estimated 25 (L) >60 mL/min    Comment: (NOTE) Calculated using the CKD-EPI Creatinine Equation (2021)    Anion gap 9 5 - 15    Comment: Performed at [facility], [place] [address] [place], [place] [address]  CBC     Status: Abnormal   Collection Time: 07/28/21  4:35 AM  Result Value Ref Range   WBC 10.7 (H) 4.0 - 10.5 K/uL   RBC 2.90 (L) 4.22 - 5.81 MIL/uL   Hemoglobin 9.4 (L) 13.0 - 17.0 g/dL   HCT 28.8 (L) 39.0 - 52.0 %   MCV 99.3 80.0 - 100.0 fL   MCH 32.4 26.0 - 34.0 pg   MCHC 32.6 30.0 - 36.0 g/dL   RDW 14.0 11.5 - 15.5 %   Platelets 187 150 - 400 K/uL   nRBC 0.0 0.0 - 0.2 %    Comment: Performed at [facility], [place] [address] [place], [place] [address]  Basic metabolic  panel     Status: Abnormal   Collection Time: 07/29/21  3:39 AM  Result Value Ref Range   Sodium 139 135 - 145 mmol/L   Potassium 3.8 3.5 - 5.1 mmol/L   Chloride 112 (H) 98 - 111 mmol/L   CO2 21 (L) 22 - 32 mmol/L   Glucose, Bld 106 (H) 70 - 99 mg/dL    Comment: Glucose reference range applies only to samples taken after fasting for at least 8 hours.   BUN 26 (H) 8 - 23 mg/dL   Creatinine, Ser 2.02 (H) 0.61 - 1.24 mg/dL   Calcium 8.4 (L) 8.9 - 10.3 mg/dL   GFR, Estimated 33 (L) >60 mL/min    Comment: (NOTE) Calculated using the CKD-EPI Creatinine Equation (2021)    Anion gap 6 5 - 15    Comment: Performed at [facility] [address] [place], [place] [address]  CBC with Differential/Platelet     Status: Abnormal   Collection Time: 07/29/21  3:39 AM  Result Value Ref Range   WBC 10.0 4.0 - 10.5 K/uL   RBC 2.71 (L) 4.22 - 5.81 MIL/uL   Hemoglobin 8.9 (L) 13.0 - 17.0 g/dL   HCT 26.1 (L) 39.0 - 52.0 %   MCV 96.3 80.0 - 100.0 fL   MCH 32.8 26.0 - 34.0 pg   MCHC 34.1 30.0 - 36.0 g/dL   RDW 13.4 11.5 - 15.5 %   Platelets 168 150 - 400 K/uL   nRBC 0.0 0.0 - 0.2 %   Neutrophils Relative % 68 %   Neutro Abs 6.8 1.7 - 7.7 K/uL   Lymphocytes Relative 19 %   Lymphs Abs 1.9 0.7 - 4.0 K/uL   Monocytes Relative 9 %   Monocytes Absolute 0.9  0.1 - 1.0 K/uL   Eosinophils Relative 3 %   Eosinophils Absolute 0.3 0.0 - 0.5 K/uL   Basophils Relative 0 %   Basophils Absolute 0.0 0.0 - 0.1 K/uL   Immature Granulocytes 1 %   Abs Immature Granulocytes 0.06 0.00 - 0.07 K/uL    Comment: Performed at [place] [address], [place],  [address]   CT ABDOMEN PELVIS WO CONTRAST  Result Date: 07/29/2021 CLINICAL DATA:  Abdominal pain EXAM: CT ABDOMEN AND PELVIS WITHOUT CONTRAST TECHNIQUE: Multidetector CT imaging of the abdomen and pelvis was performed following the standard protocol without IV contrast. RADIATION DOSE REDUCTION: This exam was performed according to the  departmental dose-optimization program which includes automated exposure control, adjustment of the mA and/or kV according to patient size and/or use of iterative reconstruction technique. COMPARISON:  CT abdomen/pelvis 04/28/2021, right upper quadrant ultrasound 07/27/2021 FINDINGS: Lower chest: There are patchy opacities in the lung bases. Is mild calcification of the aortic valve. Coronary artery calcification is noted in the left anterior descending artery. Hepatobiliary: The liver is unremarkable, within the confines of noncontrast technique. There is hyperdensity within gallbladder likely reflecting a combination of stones and sludge. There is no appreciable gallbladder wall thickening and no significant pericholecystic fluid or inflammatory change. Pancreas: Unremarkable. Spleen: Unremarkable. Adrenals/Urinary Tract: The adrenals are unremarkable. Multiple right renal cysts are noted, the largest measuring up to 5.2 cm. No other focal lesions are seen, within the confines of noncontrast technique. No stones are seen. There is no hydronephrosis or hydroureter. The bladder is unremarkable. Stomach/Bowel: The stomach is unremarkable, allowing for underdistention. There is no evidence of bowel obstruction. There is no abnormal bowel wall thickening or inflammatory change. There is colonic diverticulosis without evidence of acute diverticulitis. The appendix is not definitively identified, but there is no pericecal inflammatory change. Vascular/Lymphatic: Is extensive calcified atherosclerotic plaque throughout the nonaneurysmal abdominal aorta. There is no abdominal or pelvic lymphadenopathy. 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 The prostate is mildly enlarged, unchanged. The seminal vesicles are unremarkable. Other: There is no ascites or free air. Musculoskeletal: There is a nondisplaced fracture of the left eleventh and twelfth ribs at the costochondral junctions (3-24, 3-30). There is an  additional fracture of the eleventh rib laterally (3-38). There are nondisplaced fractures of the left transverse processes at T12 and L1 (6-133, 6-124). These are new since the prior study from 04/28/2021 and may be related to the recent fall. Vertebral body heights are preserved. Post kyphoplasty changes are seen at L1. There are flowing anterior osteophytes in the imaged lower thoracic spine consistent with diffuse idiopathic skeletal hyperostosis. IMPRESSION: 1. Nondisplaced fractures of the left eleventh and twelfth ribs (the twelfth rib is broken in two places), and left transverse processes at T12 through L3, new since the prior study from November 2022 and likely related to the recent fall. 2. Otherwise, no acute findings in the abdomen or pelvis. 3. Cholelithiasis and probable sludge without evidence of acute cholecystitis. 4. Diverticulosis without evidence of acute diverticulitis. 5. Patchy opacities in the lung bases may reflect atelectasis, though infection can not be excluded. Aortic Atherosclerosis (ICD10-I70.0). Electronically Signed   By: Valetta Mole M.D.   On: 07/29/2021 13:32   EEG adult  Result Date: 07/27/2021 Greta Doom, MD     07/28/2021  9:13 AM History: 82 yo M being evaluated for encephalopathy Sedation: none Technique: This EEG was acquired with electrodes placed according to the International 10-20  electrode system (including Fp1, Fp2, F3, F4, C3, C4, P3, P4, O1, O2, T3, T4, T5, T6, A1, A2, Fz, Cz, Pz). The following electrodes were missing or displaced: none. Background: There is a poorly formed and poorly sustained posterior dominant rhythm achieving a maximal frequency of 9-10 Hz. In addition there is diffuse generalized irregular delta and theta range activity seen throughout the recording. Well formed sleep is not recorded. No epileptiform discharges were seen. Photic stimulation: Physiologic driving is not performed EEG Abnormalities: 1) Generalized irregular slow  activity. Clinical Interpretation: This EEG is consistent with a generalized non-specific cerebral dysfunction(encephalopathy). There was no seizure or seizure predisposition recorded on this study. Please note that lack of epileptiform activity on EEG does not preclude the possibility of epilepsy. Roland Rack, MD [facility] [phone] If 7pm- 7am, please page neurology on call as listed in [place].      There were no vitals taken for this visit.  Medical Problem List and Plan: 1. 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  2.  Antithrombotics: -DVT/anticoagulation:  Mechanical: Sequential compression devices, below knee Bilateral lower extremities; ? start Lovenox  -antiplatelet therapy: aspirin 3. Pain Management: Tylenol, tramadol as needed 4. Mood: LCSW asked to evaluate and provide emotional support  --continue carbamazepine   -antipsychotic agents: Not applicable 5. Neuropsych: This patient is not capable of making decisions on his own behalf. 6. Skin/Wound Care: Routine skin care checks 7. 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 10: History of bladder cancer/TURP  -- Bladder scan, check PVRs 11: Rib fractures: Pain control, pulmonary toilet 12: T12-L3 transverse process fractures: Stable, pain control 13: Insomnia/agitation (?): takes Ativan BID at home as this is continued 14:Gallstones without acute cholecystitis: Currently asymptomatic 15: Chronic kidney disease stage IV:  --baseline serum creatinine 2.2-2.4 --Monitor urine output and chemistries 16: Transaminitis: Monitor  chemistries 17: Hypertension: Continue Norvasc and hydralazine  -- Home Zestoretic held secondary to kidney function 18: Hyperlipidemia: Continue fenofibrate 19: Bilateral carotid artery stenosis: Asymptomatic  -- Status post bilateral carotid endarterectomies 20: Anemia, chronic disease, hemodilution etiologies likely: Monitor CBC  -- Fecal occult blood negative 2/13  -- Colonoscopy 2016, polyps.  Recall 5 years 21. Prediabetes: check CBGs AC/HS.  22. Hypokalemia: check potassium tomorrow morning.  23. Screening for Vitamin D deficiency: check vitamin D level tomorrow morning.     I have personally performed a face to face diagnostic evaluation, including, but not limited to relevant history and physical exam findings, of this patient and developed relevant assessment and plan.  Additionally, I have reviewed and concur with the physician assistant's documentation above.  Risa Grill, PA  Izora Ribas, MD 07/29/2021

## 2021-07-29 NOTE — Progress Notes (Signed)
PMR Admission Coordinator Pre-Admission Assessment   Patient: William [name] is an 81 y.o., male MRN: [id number] DOB: [birth year] Height:   Weight:     Insurance Information HMO:     PPO: yes     PCP:      IPA:      80/20:      OTHER:  PRIMARY: [employer]      Policy#: [id number]      Subscriber: pt CM Name: Lynelle Smoke      Phone#: [phone]     Fax#: EPIC access Pre-Cert#: [id number] auth for CIR from [place] at [facility] with updates due in 7 days.      Employer:  Benefits:  Phone #:      Name:  Eff. Date: 06/14/21     Deduct: $0      Out of Pocket Max: $3000 (met $0)      Life Max: n/a CIR: $200/day for days 1-5      SNF: 20 full days Outpatient:      Co-Pay: $10/visit Home Health: 100%      Co-Pay:  DME: 80%     Co-Pay: 20% Providers:  SECONDARY:       Policy#:      Phone#:    Development worker, community:       Phone#:    The Actuary for patients in Inpatient Rehabilitation Facilities with attached Privacy Act [place] Records was provided and verbally reviewed with: Family   Emergency Contact Information Contact Information       Name Relation Home Work Mobile    Roen, [place] [phone]   [phone]    [place] Daughter     [phone]    Warnke,Rob Son     [phone]           Current Medical History  Patient Admitting Diagnosis: CVA History of Present Illness: Mr. William [name] is a 81 y.o. 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  Carotid doppler revealed 1-39% stenosis of R ICA, Echo with mild LVH grade 1 diastolic dysfunction, EEG generalized encephalopathy.  Stroke team consulted and  recommended aspirin and clopidogrel daily x3 months then plavix alone.  Normalize BP over 5-7 days.  Therapy evaluations completed and pt was recommended for CIR>    Complete NIHSS TOTAL: 10   Patient's medical record from [facility] has been reviewed by the rehabilitation admission coordinator and physician.   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 No   Family History   family history includes Bipolar disorder in his mother; Heart disease in his father and mother.   Current Medications   Current Facility-Administered Medications:    acetaminophen (TYLENOL) tablet 650 mg, 650 mg, Oral, Q4H PRN **OR** acetaminophen (TYLENOL) 160 MG/5ML solution 650 mg, 650 mg, Per Tube, Q4H PRN **OR** acetaminophen (TYLENOL) suppository 650 mg, 650 mg, Rectal, Q4H PRN, Pacetti, Rondell A, MD   amLODipine (NORVASC) tablet 10 mg, 10 mg, Oral, q morning, Sirianni, Rondell A, MD, 10 mg at 07/29/21 1123   aspirin EC  tablet 81 mg, 81 mg, Oral, Daily, Lanius, Rondell A, MD, 81 mg at 07/29/21 1119   carbamazepine (TEGRETOL XR) 12 hr tablet 100 mg, 100 mg, Oral, q AM, 100 mg at 07/29/21 0758 **AND** carbamazepine (TEGRETOL XR) 12 hr tablet 200 mg, 200 mg, Oral, QHS, Pilling, Rondell A, MD, 200 mg at 07/28/21 2134   clopidogrel (PLAVIX) tablet 75 mg, 75 mg, Oral, Daily, Schueler, Rondell A, MD, 75 mg at 07/29/21 1119   fenofibrate tablet 160 mg, 160 mg, Oral, QHS, Hyder, Rondell A, MD, 160 mg at 07/28/21 2134   hydrALAZINE (APRESOLINE) injection 10 mg, 10 mg, Intravenous, Q4H PRN, Tamala Julian, Rondell A, MD   hydrocortisone valerate cream (WESTCORT) 0.2 %, , Topical, Daily PRN, Tamala Julian, Rondell A, MD   LORazepam (ATIVAN) tablet 0.5 mg, 0.5 mg, Oral, Daily, Adhikari, Amrit, MD, 0.5 mg at 07/29/21 1119   LORazepam (ATIVAN) tablet 1 mg, 1 mg, Oral, QHS, Adhikari, Amrit, MD, 1 mg at 07/28/21 2134   pantoprazole (PROTONIX) injection 40 mg, 40 mg, Intravenous, Q12H, Bosch, Rondell A, MD, 40 mg at 07/29/21 1119   senna-docusate (Senokot-S) tablet 1 tablet, 1 tablet, Oral, QHS PRN, Tamala Julian, Rondell A, MD   tamsulosin (FLOMAX) capsule 0.4 mg, 0.4 mg, Oral, QHS, Franssen, Rondell A, MD, 0.4 mg at 07/28/21 2134   traMADol (ULTRAM) tablet 50 mg, 50 mg, Oral, Q12H PRN, Shelly Coss, MD, 50 mg at 07/28/21 2134   zolpidem (AMBIEN) tablet 5 mg, 5 mg, Oral, QHS PRN, Fuller Plan A, MD, 5 mg at 07/28/21 2134   Facility-Administered Medications Ordered in Other Encounters:    gemcitabine (GEMZAR) chemo syringe for bladder instillation 2,000 mg, 2,000 mg, Bladder Instillation, Once, Franchot Gallo, MD   Patients Current Diet:  Diet Order                  Diet Heart Room service appropriate? Yes; Fluid consistency: Thin  Diet effective now                         Precautions / Restrictions Precautions Precautions: Fall Restrictions Weight Bearing Restrictions: No    Has the patient had 2 or more falls or a fall with injury in  the past year? Yes   Prior Activity Level Limited Community (1-2x/wk): used a cane, no driving, went out for appointments and to  get mail, etc   Prior Functional Level Self Care: Did the patient need help bathing, dressing, using the toilet or eating? Needed some help   Indoor Mobility: Did the patient need assistance with walking from room to room (with or without device)? Independent   Stairs: Did the patient need assistance with internal or external stairs (with or without device)? Needed some help   Functional Cognition: Did the patient need help planning regular tasks such as shopping or remembering to take medications? Needed some help   Patient Information Are you of Hispanic, Latino/a,or Spanish origin?: A. No, not of Hispanic, Latino/a, or Spanish origin What is your race?: A. White Do you need or want an interpreter to communicate with a doctor or health care staff?: 0. No   Patient's Response To:  Health Literacy and Transportation Is the patient able to respond to health literacy and transportation needs?: No Health Literacy - How often do you need to have someone help you when you read instructions, pamphlets, or other written material from your doctor or pharmacy?: Patient unable to respond In the past 12 months, has lack of transportation kept you from medical appointments or from getting medications?: No (wife) In the past 12 months, has lack of transportation kept you from meetings, work, or from getting things needed for daily living?: No (wife)   Development worker, international aid / Equipment Home Equipment: Radio producer - single point, Grab bars - tub/shower   Prior Device Use: Indicate devices/aids used by the patient prior to current illness, exacerbation or injury?  cane   Current Functional Level Cognition   Overall Cognitive Status: Impaired/Different from baseline Current Attention Level: Sustained Orientation Level: Oriented to person, Oriented to time, Disoriented to place,  Disoriented to situation Following Commands: Follows one step commands with increased time, Follows multi-step commands inconsistently Safety/Judgement: Decreased awareness of safety, Decreased awareness of deficits General Comments: pt require step-by-step cuing for all functional tasks, seems to demonstrate some apraxia with ADLs (washing face with tasks out of order).    Extremity Assessment (includes Sensation/Coordination)   Upper Extremity Assessment: RUE deficits/detail, LUE deficits/detail RUE Deficits / Details: Grossly 3/5 MMT, decreased coordination RUE Coordination: decreased fine motor, decreased gross motor LUE Deficits / Details: residual weakness from prior CVA, grossly 3-/5 (weaker than R side currenlty) and decreased coordination LUE Coordination: decreased fine motor, decreased gross motor  Lower Extremity Assessment: Defer to PT evaluation RLE Deficits / Details: generalized weakness consistent with acute stroke. Able to wiggle toes and DF and PF ankle. Unable to keep knee flexed in heel slide position. RLE Coordination: decreased gross motor (unable to coordinate and sequence side steps at EOB with diffculty initiating weight shifts. Able to slide foot ~ 2 cm with attempts to side step.) LLE Deficits / Details: residual weakness from prior R MCA stroke, however, likely exacerbated from recent CVA. Initiated muscle contraction to wiggle toes and move ankle. Able to bend L knee and hip, however, required assist. LLE Coordination: decreased gross motor (unable to coordinate and sequence side steps at EOB with diffculty initiating weight shifts.)     ADLs   Overall ADL's : Needs assistance/impaired Grooming: Sitting, Moderate assistance Upper Body Dressing : Maximal assistance, Sitting Lower Body Dressing: Total assistance, +2 for physical assistance, +2 for safety/equipment, Sit to/from stand Toilet Transfer: Maximal assistance, +2 for physical assistance, +2 for  safety/equipment Toilet Transfer Details (indicate cue type and reason): simulated side stepping towards [facility] Toileting- Clothing Manipulation and Hygiene: Total assistance, +2 for  physical assistance, +2 for safety/equipment, Bed level Toileting - Clothing Manipulation Details (indicate cue type and reason): incontinent of bladder, total assist for hygiene and linen change     Mobility   Overal bed mobility: Needs Assistance Bed Mobility: Supine to Sit Rolling: Max assist, +2 for physical assistance Supine to sit: Mod assist, +2 for physical assistance Sit to supine: Max assist, +2 for physical assistance, +2 for safety/equipment General bed mobility comments: mod +2 for LE progression to EOB, trunk elevation. Increased time, step-by-step sequencing cues     Transfers   Overall transfer level: Needs assistance Equipment used: 2 person hand held assist Transfers: Sit to/from Stand Sit to Stand: Min assist, +2 safety/equipment Bed to/from chair/wheelchair/BSC transfer type:: Step pivot Step pivot transfers: Mod assist General transfer comment: light assist to rise and steady, repeated sit<>stands from EOB x5 and x2 from chair in front of sink.     Ambulation / Gait / Stairs / Wheelchair Mobility   Ambulation/Gait Ambulation/Gait assistance: Min assist, +2 safety/equipment Gait Distance (Feet): 65 Feet Assistive device: 2 person hand held assist Gait Pattern/deviations: Step-through pattern, Decreased stride length, Trunk flexed, Shuffle General Gait Details: limited to step pvt to [facility] and to then to chair.     Posture / Balance Dynamic Sitting Balance Sitting balance - Comments: pt initially requiring modA but once pt settled in pt min guard Balance Overall balance assessment: Needs assistance Sitting-balance support: Feet supported, No upper extremity supported Sitting balance-Leahy Scale: Fair Sitting balance - Comments: pt initially requiring modA but once pt settled in pt min  guard Postural control: Posterior lean (initially) Standing balance support: Bilateral upper extremity supported, During functional activity Standing balance-Leahy Scale: Fair Standing balance comment: relies on holding onto PTs arms in face to face transfer     Special needs/care consideration N/A    Previous Home Environment (from acute therapy documentation) Living Arrangements: Spouse/significant other  Lives With: Spouse Available Help at Discharge: Family Type of Home: House Home Layout: One level Home Access: Stairs to enter Technical brewer of Steps: 1 Bathroom Shower/Tub: Chiropodist: Standard Additional Comments: spouse works 20 hrs/week   Discharge Living Setting Plans for Discharge Living Setting: Patient's home, Lives with (comment) (spouse) Type of Home at Discharge: House Discharge Home Layout: One level Discharge Home Access: Stairs to enter Entrance Stairs-Rails: None Entrance Stairs-Number of Steps: 1 Discharge Bathroom Shower/Tub: Tub/shower unit Discharge Bathroom Toilet: Standard Discharge Bathroom Accessibility: Yes How Accessible: Accessible via walker Does the patient have any problems obtaining your medications?: No   Social/Family/Support Systems Patient Roles: Spouse Anticipated Caregiver: spouse, [name] Anticipated Caregiver's Contact Information: [phone] Ability/Limitations of Caregiver: works but will ensure 24/7 supervision with friends, family, and hired caregivers if needed Caregiver Availability: 24/7 Discharge Plan Discussed with Primary Caregiver: Yes Is Caregiver In Agreement with Plan?: Yes Does Caregiver/Family have Issues with Lodging/Transportation while Pt is in Rehab?: No   Goals Patient/Family Goal for Rehab: PT/OT supervision, SLP supervision to min assist Expected length of stay: 14-16 days Pt/Family Agrees to Admission and willing to participate: Yes Program Orientation Provided & Reviewed with  Pt/Caregiver Including Roles  & Responsibilities: Yes  Barriers to Discharge: Insurance for SNF coverage   Decrease burden of Care through IP rehab admission: n/a   Possible need for SNF placement upon discharge: not anticipated. Pt with good support from spouse and reviewed need for 24/7 supervision prior to admission.    Patient Condition: I have reviewed medical records from [facility], spoken  with  TOC team , and spouse. I discussed via phone for inpatient rehabilitation assessment.  Patient will benefit from ongoing PT, OT, and SLP, can actively participate in 3 hours of therapy a day 5 days of the week, and can make measurable gains during the admission.  Patient will also benefit from the coordinated team approach during an Inpatient Acute Rehabilitation admission.  The patient will receive intensive therapy as well as Rehabilitation physician, nursing, social worker, and care management interventions.  Due to bladder management, bowel management, safety, skin/wound care, disease management, medication administration, pain management, and patient education the patient requires 24 hour a day rehabilitation nursing.  The patient is currently mod assist with mobility and basic ADLs.  Discharge setting and therapy post discharge at home with home health is anticipated.  Patient has agreed to participate in the Acute Inpatient Rehabilitation Program and will admit today.   Preadmission Screen Completed By:  Michel Santee, 07/29/2021 11:27 AM ______________________________________________________________________   Discussed status with Dr. Ranell Patrick on 07/29/21  at 11:36 AM  and received approval for admission today.   Admission Coordinator:  Michel Santee, PT, DPT time 11:36 AM Sudie Grumbling 07/29/21     Assessment/Plan: Diagnosis: CVA Does the need for close, 24 hr/day Medical supervision in concert with the patient's rehab needs make it unreasonable for this patient to be served in a less intensive  setting? Yes Co-Morbidities requiring supervision/potential complications: abdominal pain, HTN, GI bleed, arthritis, basal cell carcinoma, bladder stone Due to bladder management, bowel management, safety, skin/wound care, disease management, medication administration, pain management, and patient education, does the patient require 24 hr/day rehab nursing? Yes Does the patient require coordinated care of a physician, rehab nurse, PT, OT, and SLP to address physical and functional deficits in the context of the above medical diagnosis(es)? Yes Addressing deficits in the following areas: balance, endurance, locomotion, strength, transferring, bowel/bladder control, bathing, dressing, feeding, grooming, toileting, and psychosocial support Can the patient actively participate in an intensive therapy program of at least 3 hrs of therapy 5 days a week? Yes The potential for patient to make measurable gains while on inpatient rehab is excellent Anticipated functional outcomes upon discharge from inpatient rehab: supervision PT, supervision OT, supervision SLP Estimated rehab length of stay to reach the above functional goals is: 21 days Anticipated discharge destination: Home 10. Overall Rehab/Functional Prognosis: excellent     MD Signature: Leeroy Cha, MD

## 2021-07-29 NOTE — Progress Notes (Signed)
Inpatient Rehab Admissions Coordinator:   Notified by [facility] of request for peer to peer with Dr. Amalia Hailey.  Dr. Ranell Patrick to complete.  Will continue to follow.   Shann Medal, PT, DPT Admissions Coordinator [phone] 07/29/21  9:50 AM

## 2021-07-29 NOTE — H&P (Incomplete)
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   Mild, related to stroke history   Nocturia    Renal insufficiency 06/25/2013   S/P carotid endarterectomy    BILATERAL ICA--  PATENT PER DUPLEX  05-19-2012   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   Right calf   Urinary frequency    Vitamin D deficiency    Past Surgical History:  Procedure Laterality Date   APPENDECTOMY  AS CHILD   CARDIOVASCULAR STRESS TEST  03-27-2012  DR CRENSHAW   LOW RISK LEXISCAN STUDY-- PROBABLE NORMAL PERFUSION AND SOFT TISSUE ATTENUATION/  NO ISCHEMIA/ EF 51%   CAROTID ENDARTERECTOMY Bilateral LEFT  11-12-2008  DR GREG HAYES   RIGHT ICA  2006  ([facility])   CYSTOSCOPY W/ RETROGRADES Bilateral 06/22/2021   Procedure: CYSTOSCOPY WITH RETROGRADE PYELOGRAM;  Surgeon: Franchot Gallo, MD;  Location: [facility];  Service: Urology;  Laterality: Bilateral;   CYSTOSCOPY WITH LITHOLAPAXY N/A 02/26/2013   Procedure: CYSTOSCOPY WITH LITHOLAPAXY;  Surgeon: Franchot Gallo, MD;  Location: [facility];  Service: Urology;  Laterality: N/A;   EYE SURGERY  Jan. 2016   cataract surgery both eyes   INGUINAL HERNIA REPAIR Right 11-08-2006   IR KYPHO EA  ADDL LEVEL THORACIC OR LUMBAR  02/12/2021   IR RADIOLOGIST EVAL & MGMT  02/18/2021   MASS EXCISION N/A 03/03/2016   Procedure: EXCISION OF BACK  MASS;  Surgeon: Stark Klein, MD;  Location: [facility];  Service: General;  Laterality: N/A;   MOHS SURGERY Left 1/ 2016   Dr Nevada Crane-- Basal cell   PROSTATE SURGERY     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-C N/A 06/22/2021   Procedure: TRANSURETHRAL RESECTION OF BLADDER TUMOR;  Surgeon: Franchot Gallo, MD;  Location: [facility];  Service: Urology;  Laterality: N/A;   TRANSURETHRAL RESECTION OF PROSTATE N/A 02/26/2013   Procedure: TRANSURETHRAL RESECTION OF THE PROSTATE WITH GYRUS INSTRUMENTS;  Surgeon: Franchot Gallo, MD;  Location: [facility];  Service: Urology;  Laterality: N/A;   TRANSURETHRAL RESECTION OF PROSTATE N/A 06/22/2021   Procedure: TRANSURETHRAL RESECTION OF THE PROSTATE (TURP);  Surgeon: Franchot Gallo, MD;  Location: [facility];  Service: Urology;  Laterality: N/A;   Family History  Problem Relation Age of Onset   Heart disease Mother        CHF   Bipolar disorder Mother    Heart disease Father        CHF   Social History:  reports that he quit smoking about 16 years ago. His smoking use included cigarettes. He has a 80.00 pack-year smoking  history. He has never used smokeless tobacco. He reports current alcohol use. He reports that he does not use drugs. Allergies:  Allergies  Allergen Reactions   Bee Venom Anaphylaxis   Strawberry Extract Hives   Latex Itching   Adhesive [Tape] Other (See Comments)    BLISTER   Statins Other (See Comments)    myalgias   Medications Prior to Admission  Medication Sig Dispense Refill   acetaminophen (TYLENOL) 500 MG tablet Take 500 mg by mouth every 6 (six) hours as needed for headache (pain).     amLODipine (NORVASC) 10 MG tablet Take 1 tablet by mouth daily (Patient taking differently: Take 10 mg by mouth  every morning.) 90 tablet 3   aspirin 325 MG EC tablet Take 325 mg by mouth every morning.     Aspirin Effervescent (ALKA-SELTZER PO) Take 2 tablets by mouth daily as needed (indigestion/heartburn). chewable     carbamazepine (TEGRETOL XR) 100 MG 12 hr tablet TAKE 1 TABLET BY MOUTH EVERY MORNING AND 2 AT BEDTIME (Patient taking differently: 100-200 mg See admin instructions. Take one tablet (100 mg) by mouth every morning and two tablets (200 mg) at bedtime) 270 tablet 1   cefdinir (OMNICEF) 300 MG capsule Take 1 capsule (300 mg total) by mouth 2 (two) times daily for 7 days. 14 capsule 0   Cholecalciferol (VITAMIN D) 50 MCG (2000 UT) CAPS Take 2,000 Units by mouth every morning.     Eszopiclone 3 MG TABS TAKE 1 TABLET BY MOUTH IMMEDIATELY BEFORE BEDTIME AS NEEDED (Patient taking differently: 3 mg at bedtime.) 90 tablet 2   famotidine (PEPCID) 20 MG tablet Take 20 mg by mouth 2 (two) times daily as needed for heartburn or indigestion.     fenofibrate 160 MG tablet Take 1 tablet (160 mg total) by mouth daily. (Patient taking differently: Take 160 mg by mouth at bedtime.) 90 tablet 1   Flaxseed, Linseed, (FLAXSEED OIL) 1200 MG CAPS Take 1,200 mg by mouth 2 (two) times daily.     fluticasone (CUTIVATE) 4.78 % cream 1 application daily as needed (psoriasis).  3   lisinopril-hydrochlorothiazide (ZESTORETIC) 10-12.5 MG tablet Take 1 tablet by mouth daily (Patient taking differently: Take 1 tablet by mouth every morning.) 30 tablet 3   LORazepam (ATIVAN) 0.5 MG tablet Take 2 tablets by mouth every mornig and 1 tablet at bedtime. (Patient taking differently: Take 0.5-1 mg by mouth See admin instructions. Take 2 tablets (1 mg) by mouth every morning and 1 tablet (0.5 mg) at night) 90 tablet 5   Multiple Vitamins-Minerals (ADULT ONE DAILY GUMMIES) CHEW Chew 1 tablet by mouth every morning.     tamsulosin (FLOMAX) 0.4 MG CAPS capsule Take 1 capsule (0.4 mg total) by mouth 2 (two) times daily. (Patient taking  differently: Take 0.4 mg by mouth at bedtime.) 60 capsule 11   traMADol (ULTRAM) 50 MG tablet Take 1 tablet by mouth 3 times a day as needed. (Patient taking differently: Take 50 mg by mouth every 8 (eight) hours as needed (back pain).) 30 tablet 0   triamcinolone cream (KENALOG) 0.1 % Apply 1 application topically 2 (two) times daily. (Patient taking differently: Apply 1 application topically 2 (two) times daily as needed (psoriasis).) 30 g 0   ondansetron (ZOFRAN ODT) 4 MG disintegrating tablet Take 1 tablet (4 mg total) by mouth every 8 (eight) hours as needed for nausea or vomiting. (Patient not taking: Reported on 07/27/2021) 20 tablet 0   sildenafil (VIAGRA) 100 MG tablet  Take 1 tablet (100 mg total) by mouth as needed up to once daily. (Patient not taking: Reported on 07/27/2021) 10 tablet 11      Home: Home Living Family/patient expects to be discharged to:: Private residence Living Arrangements: Spouse/significant other Available Help at Discharge: Family Type of Home: House Home Access: Stairs to enter Technical brewer of Steps: 1 Home Layout: One level Bathroom Shower/Tub: Chiropodist: [place] - single point, Grab bars - tub/shower Additional Comments: spouse works 20 hrs/week  Lives With: Spouse   Functional History: Prior Function Prior Level of Function : Needs assist Physical Assist : Mobility (physical), ADLs (physical) Mobility (physical): Stairs, Transfers ADLs (physical): Bathing, Dressing Mobility Comments: intermittently uses cane ADLs Comments: standing in shower, spouse assists with washing his hair, spouse assists with dressing; spouse assist with driving, IADLs  Functional Status:  Mobility: Bed Mobility Overal bed mobility: Needs Assistance Bed Mobility: Supine to Sit Rolling: Max assist, +2 for physical assistance Supine to sit: Mod assist, +2 for physical assistance Sit to supine: Max assist, +2 for  physical assistance, +2 for safety/equipment General bed mobility comments: mod +2 for LE progression to EOB, trunk elevation. Increased time, step-by-step sequencing cues Transfers Overall transfer level: Needs assistance Equipment used: 2 person hand held assist Transfers: Sit to/from Stand Sit to Stand: Min assist, +2 safety/equipment Bed to/from chair/wheelchair/BSC transfer type:: Step pivot Step pivot transfers: Mod assist General transfer comment: light assist to rise and steady, repeated sit<>stands from EOB x5 and x2 from chair in front of sink. Ambulation/Gait Ambulation/Gait assistance: Min assist, +2 safety/equipment Gait Distance (Feet): 65 Feet Assistive device: 2 person hand held assist Gait Pattern/deviations: Step-through pattern, Decreased stride length, Trunk flexed, Shuffle General Gait Details: limited to step pvt to [facility] and to then to chair.    ADL: ADL Overall ADL's : Needs assistance/impaired Grooming: Sitting, Moderate assistance Upper Body Dressing : Maximal assistance, Sitting Lower Body Dressing: Total assistance, +2 for physical assistance, +2 for safety/equipment, Sit to/from stand Toilet Transfer: Maximal assistance, +2 for physical assistance, +2 for safety/equipment Toilet Transfer Details (indicate cue type and reason): simulated side stepping towards [facility] Toileting- Clothing Manipulation and Hygiene: Total assistance, +2 for physical assistance, +2 for safety/equipment, Bed level Toileting - Clothing Manipulation Details (indicate cue type and reason): incontinent of bladder, total assist for hygiene and linen change  Cognition: Cognition Overall Cognitive Status: Impaired/Different from baseline Orientation Level: Oriented to person, Oriented to time, Disoriented to place, Disoriented to situation Cognition Arousal/Alertness: Awake/alert Behavior During Therapy: Flat affect Overall Cognitive Status: Impaired/Different from baseline Area of  Impairment: Attention, Problem solving, Awareness, Orientation, Memory, Following commands, Safety/judgement Orientation Level: Disoriented to, Time, Situation, Person Current Attention Level: Sustained Memory: Decreased short-term memory Following Commands: Follows one step commands with increased time, Follows multi-step commands inconsistently Safety/Judgement: Decreased awareness of safety, Decreased awareness of deficits Awareness: Intellectual Problem Solving: Slow processing, Decreased initiation, Difficulty sequencing, Requires verbal cues, Requires tactile cues General Comments: pt require step-by-step cuing for all functional tasks, seems to demonstrate some apraxia with ADLs (washing face with tasks out of order).  Physical Exam: Blood pressure (!) 167/67, pulse 72, temperature 98.4 F (36.9 C), temperature source Oral, resp. rate 18, SpO2 97 %. Physical Exam  Results for orders placed or performed during the hospital encounter of 07/27/21 (from the past 48 hour(s))  Hemoglobin and hematocrit, blood     Status: Abnormal   Collection Time: 07/27/21  4:19 PM  Result Value Ref  Range   Hemoglobin 11.0 (L) 13.0 - 17.0 g/dL   HCT 32.8 (L) 39.0 - 52.0 %    Comment: Performed at [facility] [address], [place], [place] [address]  TSH     Status: None   Collection Time: 07/27/21  4:19 PM  Result Value Ref Range   TSH 0.555 0.350 - 4.500 uIU/mL    Comment: Performed by a 3rd Generation assay with a functional sensitivity of <=0.01 uIU/mL. Performed at [facility], [place] [address] [place], [place] [address]   Comprehensive metabolic panel     Status: Abnormal   Collection Time: 07/28/21  4:35 AM  Result Value Ref Range   Sodium 141 135 - 145 mmol/L   Potassium 4.0 3.5 - 5.1 mmol/L   Chloride 110 98 - 111 mmol/L   CO2 22 22 - 32 mmol/L   Glucose, Bld 117 (H) 70 - 99 mg/dL    Comment: Glucose reference range applies only to samples taken after fasting for at  least 8 hours.   BUN 33 (H) 8 - 23 mg/dL   Creatinine, Ser 2.50 (H) 0.61 - 1.24 mg/dL   Calcium 9.0 8.9 - 10.3 mg/dL   Total Protein 6.0 (L) 6.5 - 8.1 g/dL   Albumin 2.8 (L) 3.5 - 5.0 g/dL   AST 92 (H) 15 - 41 U/L   ALT 101 (H) 0 - 44 U/L   Alkaline Phosphatase 85 38 - 126 U/L   Total Bilirubin 0.7 0.3 - 1.2 mg/dL   GFR, Estimated 25 (L) >60 mL/min    Comment: (NOTE) Calculated using the CKD-EPI Creatinine Equation (2021)    Anion gap 9 5 - 15    Comment: Performed at [facility], [place] [address] [place], [place] [address]  CBC     Status: Abnormal   Collection Time: 07/28/21  4:35 AM  Result Value Ref Range   WBC 10.7 (H) 4.0 - 10.5 K/uL   RBC 2.90 (L) 4.22 - 5.81 MIL/uL   Hemoglobin 9.4 (L) 13.0 - 17.0 g/dL   HCT 28.8 (L) 39.0 - 52.0 %   MCV 99.3 80.0 - 100.0 fL   MCH 32.4 26.0 - 34.0 pg   MCHC 32.6 30.0 - 36.0 g/dL   RDW 14.0 11.5 - 15.5 %   Platelets 187 150 - 400 K/uL   nRBC 0.0 0.0 - 0.2 %    Comment: Performed at [facility], [place] [address], [place], [place] [address]  Basic metabolic panel     Status: Abnormal   Collection Time: 07/29/21  3:39 AM  Result Value Ref Range   Sodium 139 135 - 145 mmol/L   Potassium 3.8 3.5 - 5.1 mmol/L   Chloride 112 (H) 98 - 111 mmol/L   CO2 21 (L) 22 - 32 mmol/L   Glucose, Bld 106 (H) 70 - 99 mg/dL    Comment: Glucose reference range applies only to samples taken after fasting for at least 8 hours.   BUN 26 (H) 8 - 23 mg/dL   Creatinine, Ser 2.02 (H) 0.61 - 1.24 mg/dL   Calcium 8.4 (L) 8.9 - 10.3 mg/dL   GFR, Estimated 33 (L) >60 mL/min    Comment: (NOTE) Calculated using the CKD-EPI Creatinine Equation (2021)    Anion gap 6 5 - 15    Comment: Performed at [facility] [address], [place],  [address]  CBC with Differential/Platelet     Status:  Abnormal   Collection Time: 07/29/21  3:39 AM  Result Value Ref Range   WBC 10.0 4.0 - 10.5 K/uL   RBC 2.71 (L) 4.22 - 5.81 MIL/uL   Hemoglobin 8.9 (L)  13.0 - 17.0 g/dL   HCT 26.1 (L) 39.0 - 52.0 %   MCV 96.3 80.0 - 100.0 fL   MCH 32.8 26.0 - 34.0 pg   MCHC 34.1 30.0 - 36.0 g/dL   RDW 13.4 11.5 - 15.5 %   Platelets 168 150 - 400 K/uL   nRBC 0.0 0.0 - 0.2 %   Neutrophils Relative % 68 %   Neutro Abs 6.8 1.7 - 7.7 K/uL   Lymphocytes Relative 19 %   Lymphs Abs 1.9 0.7 - 4.0 K/uL   Monocytes Relative 9 %   Monocytes Absolute 0.9 0.1 - 1.0 K/uL   Eosinophils Relative 3 %   Eosinophils Absolute 0.3 0.0 - 0.5 K/uL   Basophils Relative 0 %   Basophils Absolute 0.0 0.0 - 0.1 K/uL   Immature Granulocytes 1 %   Abs Immature Granulocytes 0.06 0.00 - 0.07 K/uL    Comment: Performed at [facility], [address], [place], [place] [address]   NM Hepatobiliary Liver Func  Result Date: 07/27/2021 CLINICAL DATA:  Right upper quadrant abdominal pain, with sonographic Murphy sign, gallstones, and gallbladder wall thickening on ultrasound. EXAM: NUCLEAR MEDICINE HEPATOBILIARY IMAGING TECHNIQUE: Sequential images of the abdomen were obtained out to 60 minutes following intravenous administration of radiopharmaceutical. RADIOPHARMACEUTICALS:  5.3 mCi Tc-75m  Choletec IV COMPARISON:  Abdominal ultrasound 07/27/2021 FINDINGS: There is satisfactory uptake of radiopharmaceutical from the blood pool. Biliary activity is visible 15 minutes. Gallbladder activity visible by 20 minutes. Consecutive images show filling of the gallbladder. Bowel activity is shown by 70 minutes. IMPRESSION: 1. Currently the cystic duct is patent, with gallbladder activity visible 20 minutes after injection. The CBD is also patent, with bowel activity visible at 70 minutes and thereafter. Electronically Signed   By: Van Clines M.D.   On: 07/27/2021 15:07   EEG adult  Result Date: 07/27/2021 Greta Doom, MD     07/28/2021  9:13 AM History: 81 yo M being evaluated for encephalopathy Sedation: none Technique: This EEG was acquired with electrodes placed according to  the International 10-20 electrode system (including Fp1, Fp2, F3, F4, C3, C4, P3, P4, O1, O2, T3, T4, T5, T6, A1, A2, Fz, Cz, Pz). The following electrodes were missing or displaced: none. Background: There is a poorly formed and poorly sustained posterior dominant rhythm achieving a maximal frequency of 9-10 Hz. In addition there is diffuse generalized irregular delta and theta range activity seen throughout the recording. Well formed sleep is not recorded. No epileptiform discharges were seen. Photic stimulation: Physiologic driving is not performed EEG Abnormalities: 1) Generalized irregular slow activity. Clinical Interpretation: This EEG is consistent with a generalized non-specific cerebral dysfunction(encephalopathy). There was no seizure or seizure predisposition recorded on this study. Please note that lack of epileptiform activity on EEG does not preclude the possibility of epilepsy. Roland Rack, MD [facility] [phone] If 7pm- 7am, please page neurology on call as listed in [place].   ECHOCARDIOGRAM COMPLETE  Result Date: 07/27/2021    ECHOCARDIOGRAM REPORT   Patient Name:   William [name] Date of Exam: 07/27/2021 Medical Rec #:  [id number]      Height:       71.0 in Accession #:    [id number]     Weight:  213.0 lb Date of Birth:  Aug 04, 1940      BSA:          2.166 m Patient Age:    81 years       BP:           144/75 mmHg Patient Gender: M              HR:           86 bpm. Exam Location:  Inpatient Procedure: 2D Echo, Cardiac Doppler, Color Doppler and Intracardiac            Opacification Agent Indications:     Stroke  History:         Patient has no prior history of Echocardiogram examinations.  Sonographer:     Jyl Heinz Referring Phys:  [id number] RONDELL A Abed Diagnosing Phys: Oswaldo Milian MD IMPRESSIONS  1. Left ventricular ejection fraction, by estimation, is 60 to 65%. The left ventricle has normal function. The left ventricle has no regional wall motion  abnormalities. There is mild left ventricular hypertrophy. Left ventricular diastolic parameters are consistent with Grade I diastolic dysfunction (impaired relaxation).  2. Right ventricular systolic function is normal. The right ventricular size is normal. Tricuspid regurgitation signal is inadequate for assessing PA pressure.  3. The mitral valve is grossly normal. No evidence of mitral valve regurgitation. No evidence of mitral stenosis.  4. The aortic valve was not well visualized. Aortic valve regurgitation is not visualized. No aortic stenosis is present. FINDINGS  Left Ventricle: Left ventricular ejection fraction, by estimation, is 60 to 65%. The left ventricle has normal function. The left ventricle has no regional wall motion abnormalities. The left ventricular internal cavity size was normal in size. There is  mild left ventricular hypertrophy. Left ventricular diastolic parameters are consistent with Grade I diastolic dysfunction (impaired relaxation). Right Ventricle: The right ventricular size is normal. Right vetricular wall thickness was not well visualized. Right ventricular systolic function is normal. Tricuspid regurgitation signal is inadequate for assessing PA pressure. Left Atrium: Left atrial size was normal in size. Right Atrium: Right atrial size was normal in size. Pericardium: There is no evidence of pericardial effusion. Presence of epicardial fat layer. Mitral Valve: The mitral valve is grossly normal. No evidence of mitral valve regurgitation. No evidence of mitral valve stenosis. Tricuspid Valve: The tricuspid valve is normal in structure. Tricuspid valve regurgitation is trivial. Aortic Valve: The aortic valve was not well visualized. Aortic valve regurgitation is not visualized. No aortic stenosis is present. Aortic valve peak gradient measures 11.8 mmHg. Pulmonic Valve: The pulmonic valve was not well visualized. Pulmonic valve regurgitation is not visualized. Aorta: The aortic  root and ascending aorta are structurally normal, with no evidence of dilitation. IAS/Shunts: The interatrial septum was not well visualized.  LEFT VENTRICLE PLAX 2D LVIDd:         4.20 cm      Diastology LVIDs:         2.80 cm      LV e' medial:    4.79 cm/s LV PW:         1.40 cm      LV E/e' medial:  11.0 LV IVS:        1.20 cm      LV e' lateral:   7.07 cm/s LVOT diam:     2.00 cm      LV E/e' lateral: 7.4 LV SV:         60 LV  SV Index:   28 LVOT Area:     3.14 cm  LV Volumes (MOD) LV vol d, MOD A2C: 105.0 ml LV vol d, MOD A4C: 118.0 ml LV vol s, MOD A2C: 36.7 ml LV vol s, MOD A4C: 42.4 ml LV SV MOD A2C:     68.3 ml LV SV MOD A4C:     118.0 ml LV SV MOD BP:      74.1 ml RIGHT VENTRICLE RV Basal diam:  3.50 cm RV Mid diam:    2.70 cm RV S prime:     14.50 cm/s TAPSE (M-mode): 2.2 cm LEFT ATRIUM             Index        RIGHT ATRIUM           Index LA diam:        3.40 cm 1.57 cm/m   RA Area:     15.00 cm LA Vol (A2C):   42.5 ml 19.62 ml/m  RA Volume:   36.30 ml  16.76 ml/m LA Vol (A4C):   66.2 ml 30.57 ml/m LA Biplane Vol: 55.0 ml 25.40 ml/m  AORTIC VALVE AV Area (Vmax): 2.03 cm AV Vmax:        172.00 cm/s AV Peak Grad:   11.8 mmHg LVOT Vmax:      111.00 cm/s LVOT Vmean:     73.500 cm/s LVOT VTI:       0.190 m  AORTA Ao Root diam: 3.80 cm Ao Asc diam:  3.30 cm MITRAL VALVE MV Area (PHT): 4.44 cm    SHUNTS MV Decel Time: 171 msec    Systemic VTI:  0.19 m MV E velocity: 52.50 cm/s  Systemic Diam: 2.00 cm MV A velocity: 75.50 cm/s MV E/A ratio:  0.70 Oswaldo Milian MD Electronically signed by Oswaldo Milian MD Signature Date/Time: 07/27/2021/3:59:23 PM    Final (Updated)       Blood pressure (!) 167/67, pulse 72, temperature 98.4 F (36.9 C), temperature source Oral, resp. rate 18, SpO2 97 %.  Medical Problem List and Plan: 1. Functional deficits secondary to ***  -patient may *** shower  -ELOS/Goals: *** 2.  Antithrombotics: -DVT/anticoagulation:  {VTE PROPHYLAXIS/ANTICOAGULATION -  [id number]}  -antiplatelet therapy: *** 3. Pain Management: *** 4. Mood: ***  -antipsychotic agents: *** 5. Neuropsych: This patient *** capable of making decisions on *** own behalf. 6. Skin/Wound Care: *** 7. Fluids/Electrolytes/Nutrition: ***     ***  Bary Leriche, PA-C 07/29/2021

## 2021-07-29 NOTE — Progress Notes (Addendum)
Inpatient Rehab Admissions Coordinator:   Spoke to pt's wife to provide update.  Waiting on abd CT to be completed/result and will potentially admit today pending clearance from MD.  Will follow.   1350: Pt clear to admit to CIR today.  Will update pt's spouse.   Shann Medal, PT, DPT Admissions Coordinator [phone] 07/29/21  12:52 PM

## 2021-07-29 NOTE — PMR Pre-admission (Signed)
PMR Admission Coordinator Pre-Admission Assessment °  °Patient: William [name] is an 81 y.o., male °MRN: [id number] °DOB: 01/04/1941 °Height:   °Weight:   °  °Insurance Information °HMO:     PPO: yes     PCP:      IPA:      80/20:      OTHER:  °PRIMARY: [employer]      Policy#: [id number]      Subscriber: pt °CM Name: Tammy      Phone#: [phone]     Fax#: EPIC access °Pre-Cert#: [id number] auth for CIR from Tammy at [facility] with updates due in 7 days.      Employer:  °Benefits:  Phone #:      Name:  °Eff. Date: 06/14/21     Deduct: $0      Out of Pocket Max: $3000 (met $0)      Life Max: n/a °CIR: $200/day for days 1-5      SNF: 20 full days °Outpatient:      Co-Pay: $10/visit °Home Health: 100%      Co-Pay:  °DME: 80%     Co-Pay: 20% °Providers:  °SECONDARY:       Policy#:      Phone#:  °  °Financial Counselor:       Phone#:  °  °The “Data Collection Information Summary” for patients in Inpatient Rehabilitation Facilities with attached “Privacy Act Statement-Health Care Records” was provided and verbally reviewed with: Family °  °Emergency Contact Information °Contact Information   °  °  Name Relation Home Work Mobile  °  Aquino-Demario,[name] C Spouse [phone]   [phone]  °  Matson,Carrine Daughter     [phone]  °  Dye,Rob Son     [phone]  °  °   °  °  °Current Medical History  °Patient Admitting Diagnosis: CVA °History of Present Illness: Mr. Rodderick J Faiola is a 81 y.o. 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  Carotid doppler revealed 1-39% stenosis of R ICA, Echo with mild LVH grade 1 diastolic dysfunction, EEG generalized encephalopathy.  Stroke team consulted and  recommended aspirin and clopidogrel daily x3 months then plavix alone.  Normalize BP over 5-7 days.  Therapy evaluations completed and pt was recommended for CIR>  °  °Complete NIHSS TOTAL: 10 °  °Patient's medical record from [facility] has been reviewed by the rehabilitation admission coordinator and physician. 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 No °  °Family History   °family history includes Bipolar disorder in his mother; Heart disease in his father and mother. °  °Current Medications °  °Current Facility-Administered Medications:  °  acetaminophen (TYLENOL) tablet 650 mg, 650 mg, Oral, Q4H PRN **OR** acetaminophen (TYLENOL) 160 MG/5ML solution 650 mg, 650 mg, Per Tube, Q4H PRN **OR** acetaminophen (TYLENOL) suppository 650 mg, 650 mg, Rectal, Q4H PRN, Fairbairn, Rondell A, MD °  amLODipine (NORVASC) tablet 10 mg, 10 mg, Oral, q morning, Agro, Rondell A, MD, 10 mg at 07/29/21 1123 °  aspirin EC  tablet 81 mg, 81 mg, Oral, Daily, Crafts, Rondell A, MD, 81 mg at 07/29/21 1119 °  carbamazepine (TEGRETOL XR) 12 hr tablet 100 mg, 100 mg, Oral, q AM, 100 mg at 07/29/21 0758 **AND** carbamazepine (TEGRETOL XR) 12 hr tablet 200 mg, 200 mg, Oral, QHS, Bastedo, Rondell A, MD, 200 mg at 07/28/21 2134 °  clopidogrel (PLAVIX) tablet 75 mg, 75 mg, Oral, Daily, Longman, Rondell A, MD, 75 mg at 07/29/21 1119 °  fenofibrate tablet 160 mg, 160 mg, Oral, QHS, Spickard, Rondell A, MD, 160 mg at 07/28/21 2134 °  hydrALAZINE (APRESOLINE) injection 10 mg, 10 mg, Intravenous, Q4H PRN, Moncivais, Rondell A, MD °  hydrocortisone valerate cream (WESTCORT) 0.2 %, , Topical, Daily PRN, Warmuth, Rondell A, MD °  LORazepam (ATIVAN) tablet 0.5 mg, 0.5 mg, Oral, Daily, Adhikari, Amrit, MD, 0.5 mg at 07/29/21 1119 °  LORazepam (ATIVAN) tablet 1 mg, 1 mg, Oral, QHS, Adhikari, Amrit, MD, 1 mg at 07/28/21 2134 °  pantoprazole (PROTONIX) injection 40 mg, 40 mg, Intravenous, Q12H, Horsfall, Rondell A, MD, 40 mg at 07/29/21 1119 °  senna-docusate (Senokot-S) tablet 1 tablet, 1 tablet, Oral, QHS PRN, Werts, Rondell A, MD °  tamsulosin (FLOMAX) capsule 0.4 mg, 0.4 mg, Oral, QHS, Chisom, Rondell A, MD, 0.4 mg at 07/28/21 2134 °  traMADol (ULTRAM) tablet 50 mg, 50 mg, Oral, Q12H PRN, Adhikari, Amrit, MD, 50 mg at 07/28/21 2134 °  zolpidem (AMBIEN) tablet 5 mg, 5 mg, Oral, QHS PRN, Modisette, Rondell A, MD, 5 mg at 07/28/21 2134 °  °Facility-Administered Medications Ordered in Other Encounters:  °  gemcitabine (GEMZAR) chemo syringe for bladder instillation 2,000 mg, 2,000 mg, Bladder Instillation, Once, Dahlstedt, Stephen, MD °  °Patients Current Diet:  °Diet Order   °  °         °    Diet Heart Room service appropriate? Yes; Fluid consistency: Thin  Diet effective now       °  °  °   °  °  °   °  °  °Precautions / Restrictions °Precautions °Precautions: Fall °Restrictions °Weight Bearing Restrictions: No  °  °Has the patient had 2 or more falls or a fall with injury in  the past year? Yes °  °Prior Activity Level °Limited Community (1-2x/wk): used a cane, no driving, went out for appointments and to   get mail, etc °  °Prior Functional Level °Self Care: Did the patient need help bathing, dressing, using the toilet or eating? Needed some help °  °Indoor Mobility: Did the patient need assistance with walking from room to room (with or without device)? Independent °  °Stairs: Did the patient need assistance with internal or external stairs (with or without device)? Needed some help °  °Functional Cognition: Did the patient need help planning regular tasks such as shopping or remembering to take medications? Needed some help °  °Patient Information °Are you of Hispanic, Latino/a,or Spanish origin?: A. No, not of Hispanic, Latino/a, or Spanish origin °What is your race?: A. White °Do you need or want an interpreter to communicate with a doctor or health care staff?: 0. No °  °Patient's Response To:  °Health Literacy and Transportation °Is the patient able to respond to health literacy and transportation needs?: No °Health Literacy - How often do you need to have someone help you when you read instructions, pamphlets, or other written material from your doctor or pharmacy?: Patient unable to respond °In the past 12 months, has lack of transportation kept you from medical appointments or from getting medications?: No (wife) °In the past 12 months, has lack of transportation kept you from meetings, work, or from getting things needed for daily living?: No (wife) °  °Home Assistive Devices / Equipment °Home Equipment: Cane - single point, Grab bars - tub/shower °  °Prior Device Use: Indicate devices/aids used by the patient prior to current illness, exacerbation or injury?  cane °  °Current Functional Level °Cognition °  Overall Cognitive Status: Impaired/Different from baseline °Current Attention Level: Sustained °Orientation Level: Oriented to person, Oriented to time, Disoriented to place,  Disoriented to situation °Following Commands: Follows one step commands with increased time, Follows multi-step commands inconsistently °Safety/Judgement: Decreased awareness of safety, Decreased awareness of deficits °General Comments: pt require step-by-step cuing for all functional tasks, seems to demonstrate some apraxia with ADLs (washing face with tasks out of order). °   °Extremity Assessment °(includes Sensation/Coordination) °  Upper Extremity Assessment: RUE deficits/detail, LUE deficits/detail °RUE Deficits / Details: Grossly 3/5 MMT, decreased coordination °RUE Coordination: decreased fine motor, decreased gross motor °LUE Deficits / Details: residual weakness from prior CVA, grossly 3-/5 (weaker than R side currenlty) and decreased coordination °LUE Coordination: decreased fine motor, decreased gross motor  °Lower Extremity Assessment: Defer to PT evaluation °RLE Deficits / Details: generalized weakness consistent with acute stroke. Able to wiggle toes and DF and PF ankle. Unable to keep knee flexed in heel slide position. °RLE Coordination: decreased gross motor (unable to coordinate and sequence side steps at EOB with diffculty initiating weight shifts. Able to slide foot ~ 2 cm with attempts to side step.) °LLE Deficits / Details: residual weakness from prior R MCA stroke, however, likely exacerbated from recent CVA. Initiated muscle contraction to wiggle toes and move ankle. Able to bend L knee and hip, however, required assist. °LLE Coordination: decreased gross motor (unable to coordinate and sequence side steps at EOB with diffculty initiating weight shifts.)  °   °ADLs °  Overall ADL's : Needs assistance/impaired °Grooming: Sitting, Moderate assistance °Upper Body Dressing : Maximal assistance, Sitting °Lower Body Dressing: Total assistance, +2 for physical assistance, +2 for safety/equipment, Sit to/from stand °Toilet Transfer: Maximal assistance, +2 for physical assistance, +2 for  safety/equipment °Toilet Transfer Details (indicate cue type and reason): simulated side stepping towards HOB °Toileting- Clothing Manipulation and Hygiene: Total assistance, +2 for   physical assistance, +2 for safety/equipment, Bed level °Toileting - Clothing Manipulation Details (indicate cue type and reason): incontinent of bladder, total assist for hygiene and linen change  °   °Mobility °  Overal bed mobility: Needs Assistance °Bed Mobility: Supine to Sit °Rolling: Max assist, +2 for physical assistance °Supine to sit: Mod assist, +2 for physical assistance °Sit to supine: Max assist, +2 for physical assistance, +2 for safety/equipment °General bed mobility comments: mod +2 for LE progression to EOB, trunk elevation. Increased time, step-by-step sequencing cues  °   °Transfers °  Overall transfer level: Needs assistance °Equipment used: 2 person hand held assist °Transfers: Sit to/from Stand °Sit to Stand: Min assist, +2 safety/equipment °Bed to/from chair/wheelchair/BSC transfer type:: Step pivot °Step pivot transfers: Mod assist °General transfer comment: light assist to rise and steady, repeated sit<>stands from EOB x5 and x2 from chair in front of sink.  °   °Ambulation / Gait / Stairs / Wheelchair Mobility °  Ambulation/Gait °Ambulation/Gait assistance: Min assist, +2 safety/equipment °Gait Distance (Feet): 65 Feet °Assistive device: 2 person hand held assist °Gait Pattern/deviations: Step-through pattern, Decreased stride length, Trunk flexed, Shuffle °General Gait Details: limited to step pvt to BSC and to then to chair.  °   °Posture / Balance Dynamic Sitting Balance °Sitting balance - Comments: pt initially requiring modA but once pt settled in pt min guard °Balance °Overall balance assessment: Needs assistance °Sitting-balance support: Feet supported, No upper extremity supported °Sitting balance-Leahy Scale: Fair °Sitting balance - Comments: pt initially requiring modA but once pt settled in pt min  guard °Postural control: Posterior lean (initially) °Standing balance support: Bilateral upper extremity supported, During functional activity °Standing balance-Leahy Scale: Fair °Standing balance comment: relies on holding onto PTs arms in face to face transfer  °   °Special needs/care consideration N/A  °  °Previous Home Environment (from acute therapy documentation) °Living Arrangements: Spouse/significant other ° Lives With: Spouse °Available Help at Discharge: Family °Type of Home: House °Home Layout: One level °Home Access: Stairs to enter °Entrance Stairs-Number of Steps: 1 °Bathroom Shower/Tub: Tub/shower unit °Bathroom Toilet: Standard °Additional Comments: spouse works 20 hrs/week °  °Discharge Living Setting °Plans for Discharge Living Setting: Patient's home, Lives with (comment) (spouse) °Type of Home at Discharge: House °Discharge Home Layout: One level °Discharge Home Access: Stairs to enter °Entrance Stairs-Rails: None °Entrance Stairs-Number of Steps: 1 °Discharge Bathroom Shower/Tub: Tub/shower unit °Discharge Bathroom Toilet: Standard °Discharge Bathroom Accessibility: Yes °How Accessible: Accessible via walker °Does the patient have any problems obtaining your medications?: No °  °Social/Family/Support Systems °Patient Roles: Spouse °Anticipated Caregiver: spouse, [name] °Anticipated Caregiver's Contact Information: [phone] °Ability/Limitations of Caregiver: works but will ensure 24/7 supervision with friends, family, and hired caregivers if needed °Caregiver Availability: 24/7 °Discharge Plan Discussed with Primary Caregiver: Yes °Is Caregiver In Agreement with Plan?: Yes °Does Caregiver/Family have Issues with Lodging/Transportation while Pt is in Rehab?: No °  °Goals °Patient/Family Goal for Rehab: PT/OT supervision, SLP supervision to min assist °Expected length of stay: 14-16 days °Pt/Family Agrees to Admission and willing to participate: Yes °Program Orientation Provided & Reviewed with  Pt/Caregiver Including Roles  & Responsibilities: Yes ° Barriers to Discharge: Insurance for SNF coverage °  °Decrease burden of Care through IP rehab admission: n/a °  °Possible need for SNF placement upon discharge: not anticipated. Pt with good support from spouse and reviewed need for 24/7 supervision prior to admission.  °  °Patient Condition: I have reviewed medical records from [place], spoken   with  TOC team , and spouse. I discussed via phone for inpatient rehabilitation assessment.  Patient will benefit from ongoing PT, OT, and SLP, can actively participate in 3 hours of therapy a day 5 days of the week, and can make measurable gains during the admission.  Patient will also benefit from the coordinated team approach during an Inpatient Acute Rehabilitation admission.  The patient will receive intensive therapy as well as Rehabilitation physician, nursing, social worker, and care management interventions.  Due to bladder management, bowel management, safety, skin/wound care, disease management, medication administration, pain management, and patient education the patient requires 24 hour a day rehabilitation nursing.  The patient is currently mod assist with mobility and basic ADLs.  Discharge setting and therapy post discharge at home with home health is anticipated.  Patient has agreed to participate in the Acute Inpatient Rehabilitation Program and will admit today. °  °Preadmission Screen Completed By:  Caitlin E Warren, 07/29/2021 11:27 AM °______________________________________________________________________   °Discussed status with Dr. Mikhaila Roh on 07/29/21  at 11:36 AM  and received approval for admission today. °  °Admission Coordinator:  Caitlin E Warren, PT, DPT time 11:36 AM /Date 07/29/21   °  °Assessment/Plan: °Diagnosis: CVA °Does the need for close, 24 hr/day Medical supervision in concert with the patient's rehab needs make it unreasonable for this patient to be served in a less intensive  setting? Yes °Co-Morbidities requiring supervision/potential complications: abdominal pain, HTN, GI bleed, arthritis, basal cell carcinoma, bladder stone °Due to bladder management, bowel management, safety, skin/wound care, disease management, medication administration, pain management, and patient education, does the patient require 24 hr/day rehab nursing? Yes °Does the patient require coordinated care of a physician, rehab nurse, PT, OT, and SLP to address physical and functional deficits in the context of the above medical diagnosis(es)? Yes °Addressing deficits in the following areas: balance, endurance, locomotion, strength, transferring, bowel/bladder control, bathing, dressing, feeding, grooming, toileting, and psychosocial support °Can the patient actively participate in an intensive therapy program of at least 3 hrs of therapy 5 days a week? Yes °The potential for patient to make measurable gains while on inpatient rehab is excellent °Anticipated functional outcomes upon discharge from inpatient rehab: supervision PT, supervision OT, supervision SLP °Estimated rehab length of stay to reach the above functional goals is: 21 days °Anticipated discharge destination: Home °10. Overall Rehab/Functional Prognosis: excellent °  °  °MD Signature: °Carnita Golob, MD °

## 2021-07-29 NOTE — Plan of Care (Signed)
°  Problem: Activity: Goal: Risk for activity intolerance will decrease Outcome: Progressing   Problem: Coping: Goal: Level of anxiety will decrease Outcome: Progressing   Problem: Elimination: Goal: Will not experience complications related to bowel motility Outcome: Progressing Goal: Will not experience complications related to urinary retention Outcome: Progressing   Problem: Skin Integrity: Goal: Risk for impaired skin integrity will decrease Outcome: Progressing

## 2021-07-29 NOTE — Progress Notes (Addendum)
Inpatient Rehabilitation Admission Medication Review by a Pharmacist  A complete drug regimen review was completed for this patient to identify any potential clinically significant medication issues.  High Risk Drug Classes Is patient taking? Indication by Medication  Antipsychotic Yes Compazine N/V  Anticoagulant No   Antibiotic No   Opioid Yes Tramadol pain  Antiplatelet Yes ASA/plavix - CVA  Hypoglycemics/insulin No   Vasoactive Medication Yes Amlodipine/hydralazine - HTN  Chemotherapy No   Other Yes Fenofibrate - Triglycerides Robaxin - spasms Ativan - anxiety Tegretol - sz     Type of Medication Issue Identified Description of Issue Recommendation(s)  Drug Interaction(s) (clinically significant)     Duplicate Therapy     Allergy     No Medication Administration End Date     Incorrect Dose     Additional Drug Therapy Needed  Lunesta, ondansetron, viagra, vitamin D Continue to hold except for Vitamin D  Significant med changes from prior encounter (inform family/care partners about these prior to discharge).    Other       Clinically significant medication issues were identified that warrant physician communication and completion of prescribed/recommended actions by midnight of the next day:  [facility]  Name of provider notified for urgent issues identified: Risa Grill, PA  Provider Method of Notification: Secure chat    Pharmacist comments:   Time spent performing this drug regimen review (minutes):  [place], PharmD, [place], AAHIVP, CPP Infectious Disease Pharmacist 07/29/2021 4:02 PM

## 2021-07-29 NOTE — H&P (Incomplete)
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  MRI and CT were negative.  Since was concern for UTI and he was started on cefdinir and discharged home.  The following day on 2/9, he presented to [facility] with worsening confusion and slurred speech, as well as vomiting of coffee-ground type emesis.  His wife found him on the bathroom floor and he was lethargic and not responding as usual.  CT of the head and cervical spine was significant for an acute perforator infarct at the left basal ganglia and corona radiata.  Neurology consultation was obtained.  Large remote right MCA infarction involving most of that territory was again noted.  The scan of the abdomen and pelvis performed secondary to upper abdominal pain and tenderness.  Findings included thickened gallbladder wall, 11th and 12th rib fractures and T12-L3 lateral transverse processes fractures.  Ultrasound of the right upper quadrant revealed gallstones.  General surgery consulted, HIDA scan performed negative for acute cholecystitis.  Tolerating heart healthy diet with thin liquids.  The patient is observed to have residual left-sided weakness from previous stroke as well as overall generalized weakness.  The patient requires inpatient medicine and rehabilitation evaluations and services for ongoing dysfunction secondary to basal ganglia infarct.  Relevant medical history includes history of bladder cancer, carotid artery disease status post bilateral carotid endarterectomies, chronic kidney disease stage III, hypertension and hyperlipidemia.  He is maintained on  aspirin.  He is also on carbamazepine for mood.  No history of seizure activity.  Plavix initiated.  Continue Plavix and aspirin for 3 months followed by Plavix alone.   Hemoglobin A1c 5.3 LDL equal 125 Echocardiogram showed ejection fraction of approximately 60 to 65% with grade 1 diastolic dysfunction EEG without seizure activity  ROS 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   Mild, related to stroke history   Nocturia    Renal insufficiency 06/25/2013   S/P carotid endarterectomy    BILATERAL ICA--  PATENT PER DUPLEX  05-19-2012   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   Right calf   Urinary frequency    Vitamin D deficiency    Past Surgical History:  Procedure Laterality Date   APPENDECTOMY  AS CHILD   CARDIOVASCULAR STRESS TEST  03-27-2012  DR CRENSHAW   LOW RISK LEXISCAN STUDY-- PROBABLE NORMAL PERFUSION AND SOFT TISSUE ATTENUATION/  NO ISCHEMIA/ EF 51%   CAROTID ENDARTERECTOMY Bilateral LEFT  11-12-2008  DR Drucie Opitz   RIGHT ICA  2006  ([facility])   CYSTOSCOPY W/ RETROGRADES Bilateral 06/22/2021   Procedure: CYSTOSCOPY WITH RETROGRADE PYELOGRAM;  Surgeon: Franchot Gallo, MD;  Location: [facility];  Service: Urology;  Laterality: Bilateral;   CYSTOSCOPY WITH LITHOLAPAXY N/A 02/26/2013   Procedure: CYSTOSCOPY WITH LITHOLAPAXY;  Surgeon: Franchot Gallo, MD;  Location: [facility];  Service: Urology;  Laterality: N/A;   EYE SURGERY  Jan. 2016   cataract surgery both eyes   INGUINAL HERNIA REPAIR Right 11-08-2006   IR KYPHO EA ADDL LEVEL THORACIC OR LUMBAR  02/12/2021   IR RADIOLOGIST EVAL & MGMT  02/18/2021   MASS EXCISION N/A 03/03/2016   Procedure: EXCISION OF BACK  MASS;  Surgeon: Stark Klein, MD;  Location: [facility];  Service: General;  Laterality: N/A;   MOHS SURGERY Left 1/ 2016   Dr Nevada Crane-- Basal cell   PROSTATE SURGERY     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-C N/A 06/22/2021   Procedure: TRANSURETHRAL RESECTION OF BLADDER TUMOR;  Surgeon: Franchot Gallo, MD;  Location: [facility];  Service: Urology;  Laterality: N/A;   TRANSURETHRAL RESECTION OF PROSTATE N/A 02/26/2013   Procedure: TRANSURETHRAL RESECTION OF THE PROSTATE WITH GYRUS INSTRUMENTS;  Surgeon: Franchot Gallo, MD;  Location: [facility];  Service: Urology;  Laterality: N/A;   TRANSURETHRAL RESECTION OF PROSTATE N/A 06/22/2021   Procedure: TRANSURETHRAL RESECTION OF THE PROSTATE (TURP);  Surgeon: Franchot Gallo, MD;  Location: [facility];  Service: Urology;  Laterality: N/A;   Family History  Problem Relation Age of Onset   Heart disease Mother        CHF   Bipolar disorder Mother    Heart disease Father        CHF   Social History:  reports that he quit smoking about 16 years ago. His smoking use included cigarettes. He has a 80.00 pack-year smoking history. He has never used smokeless tobacco. He reports current alcohol use. He reports that he does not use drugs. Allergies:  Allergies  Allergen Reactions   Bee Venom Anaphylaxis   Strawberry Extract Hives   Latex Itching   Adhesive [Tape] Other (See Comments)    BLISTER   Statins Other (See Comments)    myalgias   Medications Prior to Admission  Medication Sig Dispense Refill   acetaminophen  (TYLENOL) 500 MG tablet Take 500 mg by mouth every 6 (six) hours as needed for headache (pain).     amLODipine (NORVASC) 10 MG tablet Take 1 tablet by mouth daily (Patient taking differently: Take 10 mg by mouth every morning.) 90 tablet 3   aspirin 325 MG EC tablet Take 325 mg by mouth every morning.     Aspirin Effervescent (ALKA-SELTZER PO) Take 2 tablets by mouth daily as needed (indigestion/heartburn). chewable     carbamazepine (TEGRETOL XR) 100 MG 12 hr tablet TAKE 1 TABLET BY MOUTH EVERY MORNING AND 2 AT BEDTIME (Patient taking differently: 100-200 mg See admin instructions. Take one tablet (100 mg) by mouth every morning and two tablets (200 mg) at bedtime) 270 tablet 1   cefdinir (OMNICEF) 300 MG capsule Take 1 capsule (300 mg total) by mouth 2 (two) times daily for 7 days. 14 capsule 0   Cholecalciferol (VITAMIN D) 50 MCG (2000 UT) CAPS Take 2,000 Units by mouth every morning.     Eszopiclone 3 MG TABS TAKE 1 TABLET BY MOUTH IMMEDIATELY BEFORE  BEDTIME AS NEEDED (Patient taking differently: 3 mg at bedtime.) 90 tablet 2   famotidine (PEPCID) 20 MG tablet Take 20 mg by mouth 2 (two) times daily as needed for heartburn or indigestion.     fenofibrate 160 MG tablet Take 1 tablet (160 mg total) by mouth daily. (Patient taking differently: Take 160 mg by mouth at bedtime.) 90 tablet 1   Flaxseed, Linseed, (FLAXSEED OIL) 1200 MG CAPS Take 1,200 mg by mouth 2 (two) times daily.     fluticasone (CUTIVATE) 7.02 % cream 1 application daily as needed (psoriasis).  3   lisinopril-hydrochlorothiazide (ZESTORETIC) 10-12.5 MG tablet Take 1 tablet by mouth daily (Patient taking differently: Take 1 tablet by mouth every morning.) 30 tablet 3   LORazepam (ATIVAN) 0.5 MG tablet Take 2 tablets by mouth every mornig and 1 tablet at bedtime. (Patient taking differently: Take 0.5-1 mg by mouth See admin instructions. Take 2 tablets (1 mg) by mouth every morning and 1 tablet (0.5 mg) at night) 90 tablet 5   Multiple  Vitamins-Minerals (ADULT ONE DAILY GUMMIES) CHEW Chew 1 tablet by mouth every morning.     tamsulosin (FLOMAX) 0.4 MG CAPS capsule Take 1 capsule (0.4 mg total) by mouth 2 (two) times daily. (Patient taking differently: Take 0.4 mg by mouth at bedtime.) 60 capsule 11   traMADol (ULTRAM) 50 MG tablet Take 1 tablet by mouth 3 times a day as needed. (Patient taking differently: Take 50 mg by mouth every 8 (eight) hours as needed (back pain).) 30 tablet 0   triamcinolone cream (KENALOG) 0.1 % Apply 1 application topically 2 (two) times daily. (Patient taking differently: Apply 1 application topically 2 (two) times daily as needed (psoriasis).) 30 g 0   ondansetron (ZOFRAN ODT) 4 MG disintegrating tablet Take 1 tablet (4 mg total) by mouth every 8 (eight) hours as needed for nausea or vomiting. (Patient not taking: Reported on 07/27/2021) 20 tablet 0   sildenafil (VIAGRA) 100 MG tablet Take 1 tablet (100 mg total) by mouth as needed up to once daily. (Patient not taking: Reported on 07/27/2021) 10 tablet 11      Home: Home Living Family/patient expects to be discharged to:: Private residence Living Arrangements: Spouse/significant other Available Help at Discharge: Family Type of Home: House Home Access: Stairs to enter Technical brewer of Steps: 1 Home Layout: One level Bathroom Shower/Tub: Chiropodist: [place] - single point, Grab bars - tub/shower Additional Comments: spouse works 20 hrs/week  Lives With: Spouse   Functional History: Prior Function Prior Level of Function : Needs assist Physical Assist : Mobility (physical), ADLs (physical) Mobility (physical): Stairs, Transfers ADLs (physical): Bathing, Dressing Mobility Comments: intermittently uses cane ADLs Comments: standing in shower, spouse assists with washing his hair, spouse assists with dressing; spouse assist with driving, IADLs  Functional Status:  Mobility: Bed  Mobility Overal bed mobility: Needs Assistance Bed Mobility: Supine to Sit Rolling: Max assist, +2 for physical assistance Supine to sit: Mod assist, +2 for physical assistance Sit to supine: Max assist, +2 for physical assistance, +2 for safety/equipment General bed mobility comments: mod +2 for LE progression to EOB, trunk elevation. Increased time, step-by-step sequencing cues Transfers Overall transfer level: Needs assistance Equipment used: 2 person hand held assist Transfers: Sit to/from Stand Sit to Stand: Min assist, +2 safety/equipment Bed to/from chair/wheelchair/BSC transfer type:: Step pivot Step pivot transfers: Mod assist General transfer comment: light assist to rise and steady, repeated sit<>stands from EOB x5 and  x2 from chair in front of sink. Ambulation/Gait Ambulation/Gait assistance: Min assist, +2 safety/equipment Gait Distance (Feet): 65 Feet Assistive device: 2 person hand held assist Gait Pattern/deviations: Step-through pattern, Decreased stride length, Trunk flexed, Shuffle General Gait Details: assist to steady, cues for upright posture, hallway navigation. Gait velocity: decr    ADL: ADL Overall ADL's : Needs assistance/impaired Grooming: Sitting, Moderate assistance Upper Body Dressing : Maximal assistance, Sitting Lower Body Dressing: Total assistance, +2 for physical assistance, +2 for safety/equipment, Sit to/from stand Toilet Transfer: Maximal assistance, +2 for physical assistance, +2 for safety/equipment Toilet Transfer Details (indicate cue type and reason): simulated side stepping towards [facility] Toileting- Clothing Manipulation and Hygiene: Total assistance, +2 for physical assistance, +2 for safety/equipment, Bed level Toileting - Clothing Manipulation Details (indicate cue type and reason): incontinent of bladder, total assist for hygiene and linen change  Cognition: Cognition Overall Cognitive Status: Impaired/Different from  baseline Orientation Level: Oriented X4 Cognition Arousal/Alertness: Awake/alert Behavior During Therapy: Flat affect Overall Cognitive Status: Impaired/Different from baseline Area of Impairment: Attention, Problem solving, Awareness, Orientation, Memory, Following commands, Safety/judgement Orientation Level: Disoriented to, Time, Situation, Person Current Attention Level: Sustained Memory: Decreased short-term memory Following Commands: Follows one step commands with increased time, Follows multi-step commands inconsistently Safety/Judgement: Decreased awareness of safety, Decreased awareness of deficits Awareness: Intellectual Problem Solving: Slow processing, Decreased initiation, Difficulty sequencing, Requires verbal cues, Requires tactile cues General Comments: pt require step-by-step cuing for all functional tasks, seems to demonstrate some apraxia with ADLs (washing face with tasks out of order).  Physical Exam: Blood pressure (!) 124/59, pulse 73, temperature 98 F (36.7 C), temperature source Oral, resp. rate 18, SpO2 97 %. Physical Exam  Results for orders placed or performed during the hospital encounter of 07/27/21 (from the past 48 hour(s))  Hemoglobin and hematocrit, blood     Status: Abnormal   Collection Time: 07/27/21  4:19 PM  Result Value Ref Range   Hemoglobin 11.0 (L) 13.0 - 17.0 g/dL   HCT 32.8 (L) 39.0 - 52.0 %    Comment: Performed at [facility], [address] [place], [place] [address]  TSH     Status: None   Collection Time: 07/27/21  4:19 PM  Result Value Ref Range   TSH 0.555 0.350 - 4.500 uIU/mL    Comment: Performed by a 3rd Generation assay with a functional sensitivity of <=0.01 uIU/mL. Performed at [facility], [place] [address] [place], [place] [address]   Comprehensive metabolic panel     Status: Abnormal   Collection Time: 07/28/21  4:35 AM  Result Value Ref Range   Sodium 141 135 - 145 mmol/L   Potassium 4.0 3.5 - 5.1  mmol/L   Chloride 110 98 - 111 mmol/L   CO2 22 22 - 32 mmol/L   Glucose, Bld 117 (H) 70 - 99 mg/dL    Comment: Glucose reference range applies only to samples taken after fasting for at least 8 hours.   BUN 33 (H) 8 - 23 mg/dL   Creatinine, Ser 2.50 (H) 0.61 - 1.24 mg/dL   Calcium 9.0 8.9 - 10.3 mg/dL   Total Protein 6.0 (L) 6.5 - 8.1 g/dL   Albumin 2.8 (L) 3.5 - 5.0 g/dL   AST 92 (H) 15 - 41 U/L   ALT 101 (H) 0 - 44 U/L   Alkaline Phosphatase 85 38 - 126 U/L   Total Bilirubin 0.7 0.3 - 1.2 mg/dL   GFR, Estimated 25 (L) >60  mL/min    Comment: (NOTE) Calculated using the CKD-EPI Creatinine Equation (2021)    Anion gap 9 5 - 15    Comment: Performed at [facility], [place] [address], [place], [place] [address]  CBC     Status: Abnormal   Collection Time: 07/28/21  4:35 AM  Result Value Ref Range   WBC 10.7 (H) 4.0 - 10.5 K/uL   RBC 2.90 (L) 4.22 - 5.81 MIL/uL   Hemoglobin 9.4 (L) 13.0 - 17.0 g/dL   HCT 28.8 (L) 39.0 - 52.0 %   MCV 99.3 80.0 - 100.0 fL   MCH 32.4 26.0 - 34.0 pg   MCHC 32.6 30.0 - 36.0 g/dL   RDW 14.0 11.5 - 15.5 %   Platelets 187 150 - 400 K/uL   nRBC 0.0 0.0 - 0.2 %    Comment: Performed at [facility], [place] [address], [place], [place] [address]  Basic metabolic panel     Status: Abnormal   Collection Time: 07/29/21  3:39 AM  Result Value Ref Range   Sodium 139 135 - 145 mmol/L   Potassium 3.8 3.5 - 5.1 mmol/L   Chloride 112 (H) 98 - 111 mmol/L   CO2 21 (L) 22 - 32 mmol/L   Glucose, Bld 106 (H) 70 - 99 mg/dL    Comment: Glucose reference range applies only to samples taken after fasting for at least 8 hours.   BUN 26 (H) 8 - 23 mg/dL   Creatinine, Ser 2.02 (H) 0.61 - 1.24 mg/dL   Calcium 8.4 (L) 8.9 - 10.3 mg/dL   GFR, Estimated 33 (L) >60 mL/min    Comment: (NOTE) Calculated using the CKD-EPI Creatinine Equation (2021)    Anion gap 6 5 - 15    Comment: Performed at [place] [address], [place], [place] [address]  CBC with  Differential/Platelet     Status: Abnormal   Collection Time: 07/29/21  3:39 AM  Result Value Ref Range   WBC 10.0 4.0 - 10.5 K/uL   RBC 2.71 (L) 4.22 - 5.81 MIL/uL   Hemoglobin 8.9 (L) 13.0 - 17.0 g/dL   HCT 26.1 (L) 39.0 - 52.0 %   MCV 96.3 80.0 - 100.0 fL   MCH 32.8 26.0 - 34.0 pg   MCHC 34.1 30.0 - 36.0 g/dL   RDW 13.4 11.5 - 15.5 %   Platelets 168 150 - 400 K/uL   nRBC 0.0 0.0 - 0.2 %   Neutrophils Relative % 68 %   Neutro Abs 6.8 1.7 - 7.7 K/uL   Lymphocytes Relative 19 %   Lymphs Abs 1.9 0.7 - 4.0 K/uL   Monocytes Relative 9 %   Monocytes Absolute 0.9 0.1 - 1.0 K/uL   Eosinophils Relative 3 %   Eosinophils Absolute 0.3 0.0 - 0.5 K/uL   Basophils Relative 0 %   Basophils Absolute 0.0 0.0 - 0.1 K/uL   Immature Granulocytes 1 %   Abs Immature Granulocytes 0.06 0.00 - 0.07 K/uL    Comment: Performed at [facility] [address] [place], [place] [address]   CT ABDOMEN PELVIS WO CONTRAST  Result Date: 07/29/2021 CLINICAL DATA:  Abdominal pain EXAM: CT ABDOMEN AND PELVIS WITHOUT CONTRAST TECHNIQUE: Multidetector CT imaging of the abdomen and pelvis was performed following the standard protocol without IV contrast. RADIATION DOSE REDUCTION: This exam was performed according to the departmental dose-optimization program which includes automated exposure control, adjustment of the mA  and/or kV according to patient size and/or use of iterative reconstruction technique. COMPARISON:  CT abdomen/pelvis 04/28/2021, right upper quadrant ultrasound 07/27/2021 FINDINGS: Lower chest: There are patchy opacities in the lung bases. Is mild calcification of the aortic valve. Coronary artery calcification is noted in the left anterior descending artery. Hepatobiliary: The liver is unremarkable, within the confines of noncontrast technique. There is hyperdensity within gallbladder likely reflecting a combination of stones and sludge. There is no appreciable gallbladder wall thickening and no  significant pericholecystic fluid or inflammatory change. Pancreas: Unremarkable. Spleen: Unremarkable. Adrenals/Urinary Tract: The adrenals are unremarkable. Multiple right renal cysts are noted, the largest measuring up to 5.2 cm. No other focal lesions are seen, within the confines of noncontrast technique. No stones are seen. There is no hydronephrosis or hydroureter. The bladder is unremarkable. Stomach/Bowel: The stomach is unremarkable, allowing for underdistention. There is no evidence of bowel obstruction. There is no abnormal bowel wall thickening or inflammatory change. There is colonic diverticulosis without evidence of acute diverticulitis. The appendix is not definitively identified, but there is no pericecal inflammatory change. Vascular/Lymphatic: Is extensive calcified atherosclerotic plaque throughout the nonaneurysmal abdominal aorta. There is no abdominal or pelvic lymphadenopathy. 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 The prostate is mildly enlarged, unchanged. The seminal vesicles are unremarkable. Other: There is no ascites or free air. Musculoskeletal: There is a nondisplaced fracture of the left eleventh and twelfth ribs at the costochondral junctions (3-24, 3-30). There is an additional fracture of the eleventh rib laterally (3-38). There are nondisplaced fractures of the left transverse processes at T12 and L1 (6-133, 6-124). These are new since the prior study from 04/28/2021 and may be related to the recent fall. Vertebral body heights are preserved. Post kyphoplasty changes are seen at L1. There are flowing anterior osteophytes in the imaged lower thoracic spine consistent with diffuse idiopathic skeletal hyperostosis. IMPRESSION: 1. Nondisplaced fractures of the left eleventh and twelfth ribs (the twelfth rib is broken in two places), and left transverse processes at T12 through L3, new since the prior study from November 2022 and likely related to the recent fall. 2.  Otherwise, no acute findings in the abdomen or pelvis. 3. Cholelithiasis and probable sludge without evidence of acute cholecystitis. 4. Diverticulosis without evidence of acute diverticulitis. 5. Patchy opacities in the lung bases may reflect atelectasis, though infection can not be excluded. Aortic Atherosclerosis (ICD10-I70.0). Electronically Signed   By: Valetta Mole M.D.   On: 07/29/2021 13:32   NM Hepatobiliary Liver Func  Result Date: 07/27/2021 CLINICAL DATA:  Right upper quadrant abdominal pain, with sonographic Murphy sign, gallstones, and gallbladder wall thickening on ultrasound. EXAM: NUCLEAR MEDICINE HEPATOBILIARY IMAGING TECHNIQUE: Sequential images of the abdomen were obtained out to 60 minutes following intravenous administration of radiopharmaceutical. RADIOPHARMACEUTICALS:  5.3 mCi Tc-71m  Choletec IV COMPARISON:  Abdominal ultrasound 07/27/2021 FINDINGS: There is satisfactory uptake of radiopharmaceutical from the blood pool. Biliary activity is visible 15 minutes. Gallbladder activity visible by 20 minutes. Consecutive images show filling of the gallbladder. Bowel activity is shown by 70 minutes. IMPRESSION: 1. Currently the cystic duct is patent, with gallbladder activity visible 20 minutes after injection. The CBD is also patent, with bowel activity visible at 70 minutes and thereafter. Electronically Signed   By: Van Clines M.D.   On: 07/27/2021 15:07   EEG adult  Result Date: 07/27/2021 Greta Doom, MD     07/28/2021  9:13 AM History: 81 yo M being evaluated for encephalopathy  Sedation: none Technique: This EEG was acquired with electrodes placed according to the International 10-20 electrode system (including Fp1, Fp2, F3, F4, C3, C4, P3, P4, O1, O2, T3, T4, T5, T6, A1, A2, Fz, Cz, Pz). The following electrodes were missing or displaced: none. Background: There is a poorly formed and poorly sustained posterior dominant rhythm achieving a maximal frequency of 9-10  Hz. In addition there is diffuse generalized irregular delta and theta range activity seen throughout the recording. Well formed sleep is not recorded. No epileptiform discharges were seen. Photic stimulation: Physiologic driving is not performed EEG Abnormalities: 1) Generalized irregular slow activity. Clinical Interpretation: This EEG is consistent with a generalized non-specific cerebral dysfunction(encephalopathy). There was no seizure or seizure predisposition recorded on this study. Please note that lack of epileptiform activity on EEG does not preclude the possibility of epilepsy. Roland Rack, MD [facility] [phone] If 7pm- 7am, please page neurology on call as listed in [place].      Blood pressure (!) 124/59, pulse 73, temperature 98 F (36.7 C), temperature source Oral, resp. rate 18, SpO2 97 %.  Medical Problem List and Plan: 1. 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  Antithrombotics: -DVT/anticoagulation:  Mechanical: Sequential compression devices, below knee Bilateral lower extremities; ? start Lovenox  -antiplatelet therapy: aspirin 3. Pain Management: Tylenol, tramadol as needed 4. Mood: LCSW asked to evaluate and provide emotional support  --continue carbamazepine   -antipsychotic agents: Not applicable 5. Neuropsych: This patient *** capable of making decisions on *** own behalf. 6. Skin/Wound Care: Routine skin care checks 7. 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 10: History of bladder cancer/TURP  -- Bladder scan, check PVRs 11: Rib fractures: Pain control, pulmonary toilet 12: T12-L3 transverse  process fractures: Stable, pain control 13: Insomnia/agitation (?): takes Ativan BID at home as this is continued 14:Gallstones without acute cholecystitis: Currently asymptomatic 15: Chronic kidney disease stage IV:  --baseline serum creatinine 2.2-2.4 --Monitor urine output and chemistries 16: Transaminitis: Monitor chemistries 17: Hypertension: Continue Norvasc and hydralazine  -- Home Zestoretic held secondary to kidney function 18: Hyperlipidemia: Continue fenofibrate 19: Bilateral carotid artery stenosis: Asymptomatic  -- Status post bilateral carotid endarterectomies 20: Anemia, chronic disease, hemodilution etiologies likely: Monitor CBC  -- Fecal occult blood negative 2/13  -- Colonoscopy 2016, polyps.  Recall 5 years  ***  Barbie Banner, PA-C 07/29/2021

## 2021-07-29 NOTE — Progress Notes (Signed)
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    PT Comments    Pt reports L flank pain, swollen bruising noted along L posterior flank wrapping to side per pt and family from fall on Monday 2/13. Pt overall requiring min-mod +2 assist for mobility, including bed mobility, repeated transfers, standing with single to double UE support at sink for extended time >3 minutes, and short-distance gait. Pt demonstrating sequencing difficulty, follows one-step commands consistently but cannot follow >1 step command. PT questioning apraxia, most noticeable when given a functional task at sink. Pt remains a great AIR candidate, will continue to follow.     Recommendations for follow up therapy are one component of a multi-disciplinary discharge planning process, led by the attending physician.  Recommendations may be updated based on patient status, additional functional criteria and insurance authorization.  Follow Up Recommendations  Acute inpatient rehab (3hours/day)     Assistance Recommended at Discharge Frequent or constant Supervision/Assistance  Patient can return home with the following Assistance with cooking/housework;Direct supervision/assist for medications management;Direct supervision/assist for financial management;Assist for transportation;Help with stairs or ramp for entrance;A lot of help with walking and/or transfers;A little help with bathing/dressing/bathroom   Equipment Recommendations  Other (comment) (TBD)    Recommendations for Other Services        Precautions / Restrictions Precautions Precautions: Fall Restrictions Weight Bearing Restrictions: No     Mobility  Bed Mobility Overal bed mobility: Needs Assistance Bed Mobility: Supine to Sit     Supine to sit: Mod assist, +2 for physical assistance     General bed mobility comments: mod +2 for LE progression to EOB, trunk elevation. Increased time, step-by-step sequencing cues    Transfers Overall transfer level: Needs assistance Equipment used: 2 person hand held assist Transfers: Sit to/from Stand Sit to Stand: Min assist, +2 safety/equipment           General transfer comment: light assist to rise and steady, repeated sit<>stands from EOB x5 and x2 from chair in front of sink.    Ambulation/Gait Ambulation/Gait assistance: Min assist, +2 safety/equipment Gait Distance (Feet): 65 Feet Assistive device: 2 person hand held assist Gait Pattern/deviations: Step-through pattern, Decreased stride length, Trunk flexed, Shuffle Gait velocity: decr     General Gait Details: assist to steady, cues for upright posture, hallway navigation.   Stairs             Wheelchair Mobility    Modified Rankin (Stroke Patients Only) Modified Rankin (Stroke Patients Only) Pre-Morbid Rankin Score: Moderate disability Modified Rankin: Moderately severe disability     Balance Overall balance assessment: Needs assistance Sitting-balance support: Feet supported, No upper extremity supported Sitting balance-Leahy Scale: Fair Sitting balance - Comments: able to sit EOB without PT support Postural control:  (initially) Standing balance support: During functional activity, Single extremity supported Standing balance-Leahy Scale: Poor Standing balance comment: requires UE support - standing tolerance x3 minutes at sink for face washing task  Cognition Arousal/Alertness: Awake/alert Behavior During Therapy: Flat affect Overall  Cognitive Status: Impaired/Different from baseline Area of Impairment: Attention, Problem solving, Awareness, Orientation, Memory, Following commands, Safety/judgement                 Orientation Level: Disoriented to, Time, Situation, Person Current Attention Level: Sustained Memory: Decreased short-term memory Following Commands: Follows one step commands with increased time, Follows multi-step commands inconsistently Safety/Judgement: Decreased awareness of safety, Decreased awareness of deficits Awareness: Intellectual Problem Solving: Slow processing, Decreased initiation, Difficulty sequencing, Requires verbal cues, Requires tactile cues General Comments: pt require step-by-step cuing for all functional tasks, seems to demonstrate some apraxia with ADLs (washing face with tasks out of order).        Exercises Other Exercises Other Exercises: instructed family in appropriate HEP - LAQs, heel slides, ankle pumps; pressure relief every 2 hours    General Comments General comments (skin integrity, edema, etc.): vss      Pertinent Vitals/Pain Pain Assessment Pain Assessment: Faces Faces Pain Scale: Hurts little more Pain Location: L flank (+ hematoma on L posterior flank wrapping around to L side) Pain Descriptors / Indicators: Aching, Sore Pain Intervention(s): Limited activity within patient's tolerance, Monitored during session, Repositioned    Home Living                          Prior Function            PT Goals (current goals can now be found in the care plan section) Acute Rehab PT Goals Patient Stated Goal: AIR PT Goal Formulation: With patient/family Time For Goal Achievement: 08/10/21 Potential to Achieve Goals: Fair Progress towards PT goals: Progressing toward goals    Frequency    Min 4X/week      PT Plan Current plan remains appropriate    Co-evaluation              AM-PAC PT "6 Clicks" Mobility   Outcome Measure  Help  needed turning from your back to your side while in a flat bed without using bedrails?: A Lot Help needed moving from lying on your back to sitting on the side of a flat bed without using bedrails?: A Lot Help needed moving to and from a bed to a chair (including a wheelchair)?: A Lot Help needed standing up from a chair using your arms (e.g., wheelchair or bedside chair)?: A Little Help needed to walk in hospital room?: A Little Help needed climbing 3-5 steps with a railing? : A Lot 6 Click Score: 14    End of Session Equipment Utilized During Treatment: Gait belt Activity Tolerance: Patient tolerated treatment well Patient left: with call bell/phone within reach;with family/visitor present;in chair;with chair alarm set Nurse Communication: Mobility status PT Visit Diagnosis: Muscle weakness (generalized) (M62.81);Unsteadiness on feet (R26.81);Difficulty in walking, not elsewhere classified (R26.2);Dizziness and giddiness (R42);Hemiplegia and hemiparesis Hemiplegia - Right/Left: Left Hemiplegia - caused by: Cerebral infarction     Time: [phone] (5 minutes not included in chargable time given MD arrival to room) PT Time Calculation (min) (ACUTE ONLY): 36 min  Charges:  $Gait Training: 8-22 mins $Therapeutic Activity: 8-22 mins                     Stacie Glaze, PT DPT Acute Rehabilitation Services Pager [phone]  Office [phone]    Roxine Caddy E Ruffin Pyo 07/29/2021, 11:41 AM

## 2021-07-29 NOTE — Discharge Summary (Signed)
Physician Discharge Summary  BAYLEY HURN ZDG:[id number] DOB: [birth year] DOA: 07/27/2021  PCP: Ann Held, DO  Admit date: 07/27/2021 Discharge date: 07/29/2021  Admitted From: Home Disposition:  CIR  Discharge Condition:Stable CODE STATUS:FULL Diet recommendation: Heart Healthy  Brief/Interim Summary:  Patient is a 81 year old male with history of hypertension, hyperlipidemia, CVA with residual left-sided hemiplegia, carotid artery stenosis status post bilateral CEA, CKD stage IV, BPH, bladder cancer status post TURP, GERD who presented with earlier on 2/8 with vomiting, fall, difficulty following directions, worsening left-sided facial droop, difficulty in walking.  Stroke was suspected on admission.  Extensive work-up was done in the emergency department including MRI and CT which did not show any acute abnormalities.  Urinalysis was suspicious for UTI and was started on abx and was discharged to home from ED.  He returned again to ED with confusion, slurred speech, lethargy,fall.   MRI brain showed   acute perforator infarct in the left basal ganglia and overlyingccorona radiata. Neurology consulted and was admitted for stroke work-up.PT/OT recommending CIR on discharge .  Stroke work-up completed.  He is medically stable for discharge today.  Following problems were addressed during his hospitalization:  CVA (cerebral vascular accident) [facility])- (present on admission) Patient presents after having a fall with reports altered mental status and slurred speech.  Imaging including MRI from 2/8 had not noted any signs of stroke.  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 Prior stroke from 2006 left the patient with residual left-sided hemiplegia and had been on  full dose aspirin.  Neurology has been consulted.  He was not a tPA candidate as unknown time of onset.  Hemoglobin A1c was 5.3.LDL of 125 -Echo showed EF of 60 to 95%, grade 1 diastolic dysfunction, no intracardiac source of emboli. -Ultrasound of the carotid arteries did not show any significant stenosis -EEG did not show any seizure activity -He remains intermittently confused.  Oriented to place only.  Obeys commands. -PT/OT/Speech consulted, recommended CIR on discharge -He has residual weakness on the left side from previous right-sided stroke.  Currently has generalized weakness -Neurology recommended aspirin and Plavix for 3 months followed by Plavix only.  Neurology follow-up as an outpatient   Chronic back pain/fall- (present on admission) -History of chronic back pain.  Complains of pain on the left lower chest.  CT abdomen/pelvis showed nondisplaced left 11th and 12th rib fractures and also fractures of lateral transverse process from T12-L3.  Continue supportive care.  Continue Percocet as needed, lidocaine   Normocytic anemia- (present on admission) Chronic.  On admission ,hemoglobin was 11.5 which appears around patient's baseline.  There was concern for possibility of patient vomiting up blood.  Stool guaiacs were negative.  Hemoglobin dropped to the range of 8-9 most likely this is from hemodilution, he was on IV fluids. CT abdomen/pelvis was done without contrast which did not show any evidence of intra-abdominal bleed or GI bleed   Acute metabolic encephalopathy- (present on admission) Normally patient is alert and oriented and able to follow commands. Noted to be  more lethargic and was confused not able to really follow commands.  This is most likely secondary to stroke.  EEG did not show any seizure  Nausea and vomiting- (present on admission) Patient presented due to episodes of vomiting prior  to arrival with reports of coffee-ground emesis.  No subsequent episodes since  being admitted into the hospital.  Stool guaiacs were noted to be negative. Hemoglobin appears near baseline at this time.  .  Continue Protonix.  Nausea and vomiting have resolved   Hypoxia- (present on admission) Resolved.  Chest x-ray was otherwise noted to be clear and patient denies any complaints of shortness of breath.    Cholelithiasis- (present on admission)  Ultrasound noted multiple gallstones up to 1.5 cm with mild gallbladder wall thickening with positive sonographic Murphy sign.  Gallbladder duct was poorly seen.  General surgery has been formally consulted and patient was started on empiric antibiotics of Zosyn.  General surgery evaluated and recommended HIDA scan which ruled out cholecystitis.  Antibiotics discontinued.  Will recommend to follow-up with general surgery as an outpatient for cholelithiasis   CKD (chronic kidney disease), stage IV ([place])- (present on admission) On admission creatinine 2.47 with BUN 32. Creatinine as of this year has ranged from 2.2-2.4. -Hold possible nephrotoxic agents -Continue to monitor kidney function,currently at baseline   Hyperglycemia- (present on admission) Acute.  On admission glucose was initially elevated up to 213.  Hemoglobin A1c was able to be obtained and noted to be 5.3.  Suspect elevated glucose secondary to acute stress response.     UTI (urinary tract infection)- (present on admission) Prior to arrival.  Patient has been seen on 2/8 and started on cefdinir, he took for 5 days.  Repeat urinalysis appears improved from prior but still noted moderate leukocytes with rare bacteria, and 21-50 WBCs.  Urine culture on 2/8 did not show any growth.  Urine culture  obtained here also did not show any growth. Monitor off antibiotics   Transaminitis- (present on admission) Acute on chronic.  On admission AST 226 and ALT 115.  Records note ALT has been mildly elevated intermittently previously in the past.  [place] of gallbladder showed  mild  gallbladder wall thickening and multiple gallstones present with poor visualization of the common bile duct. HIDA scan ruled out cholecystitis.  General surgery signed off.  Liver enzymes improving.  He does not have any right upper quadrant tenderness.  We recommend to follow-up with general surgery for cholelithiasis as an outpatient.Check CMP in a week   Anxiety- (present on admission) Takes lorazepam 2 times a day.  Restarted here as 0.5 mg in the morning and 1 mg at bedtime   Hyperlipidemia- (present on admission)  Home medication regimen included fenofibrate 160 mg daily.  He has a prior history of intolerance to statins. Lipid panel showed LDL of 125   HTN (hypertension)- (present on admission) Takes lisinopril-hydrochlorothiazide 10-12.5 mg daily and amlodipine 10 mg daily. -Held lisinopril-hydrochlorothiazide due to kidney function -Continue amlodipine,added hydralazine  History of right MCA infarct- (present on admission) Patient was on aspirin daily and fenofibrate at home   BPH (benign prostatic hyperplasia)- (present on admission) Patient with a history of bladder cancer and BPH requiring TURP followed in the outpatient setting by urology. -Continue Flomax   Carotid artery stenosis, asymptomatic, bilateral- (present on admission) Patient with a prior history of carotid artery stenosis status post bilateral carotid artery endarterectomies in 2010.         Discharge Diagnoses:  Principal Problem:   CVA (cerebral vascular accident) [facility]) Active Problems:   Carotid artery stenosis, asymptomatic, bilateral   BPH (benign prostatic hyperplasia)   History of right MCA infarct   HTN (hypertension)   Hyperlipidemia   Anxiety  Transaminitis   UTI (urinary tract infection)   Fall at home, initial encounter   Hyperglycemia   CKD (chronic kidney disease), stage IV (HCC)   Cholelithiasis   Hypoxia   Nausea and vomiting   Acute metabolic encephalopathy   Normocytic  anemia   Chronic back pain    Discharge Instructions  Discharge Instructions     Ambulatory referral to Neurology   Complete by: As directed    An appointment is requested in approximately: 4 weeks   Diet - low sodium heart healthy   Complete by: As directed    Discharge instructions   Complete by: As directed    1)Please take prescribed medications as instructed 2)Do a CBC and CMP test in a week 3)Follow up with neurology as an outpatient in 4 weeks.  Name and number the provider group has been addressed   Increase activity slowly   Complete by: As directed       Allergies as of 07/29/2021       Reactions   Bee Venom Anaphylaxis   Strawberry Extract Hives   Latex Itching   Adhesive [tape] Other (See Comments)   BLISTER   Statins Other (See Comments)   myalgias        Medication List     STOP taking these medications    cefdinir 300 MG capsule Commonly known as: OMNICEF   famotidine 20 MG tablet Commonly known as: PEPCID   lisinopril-hydrochlorothiazide 10-12.5 MG tablet Commonly known as: ZESTORETIC   traMADol 50 MG tablet Commonly known as: ULTRAM       TAKE these medications    acetaminophen 500 MG tablet Commonly known as: TYLENOL Take 500 mg by mouth every 6 (six) hours as needed for headache (pain).   Adult One Daily Gummies Chew Chew 1 tablet by mouth every morning.   ALKA-SELTZER PO Take 2 tablets by mouth daily as needed (indigestion/heartburn). chewable   amLODipine 10 MG tablet Commonly known as: NORVASC Take 1 tablet by mouth daily What changed: when to take this   aspirin 81 MG EC tablet Take 1 tablet (81 mg total) by mouth daily. Swallow whole. Start taking on: July 30, 2021 What changed:  medication strength how much to take when to take this additional instructions   carbamazepine 100 MG 12 hr tablet Commonly known as: TEGRETOL XR TAKE 1 TABLET BY MOUTH EVERY MORNING AND 2 AT BEDTIME What changed:  how much to  take when to take this additional instructions   clopidogrel 75 MG tablet Commonly known as: PLAVIX Take 1 tablet (75 mg total) by mouth daily. Start taking on: July 30, 2021   Eszopiclone 3 MG Tabs TAKE 1 TABLET BY MOUTH IMMEDIATELY BEFORE BEDTIME AS NEEDED What changed:  how much to take when to take this   fenofibrate 160 MG tablet Take 1 tablet (160 mg total) by mouth daily. What changed: when to take this   Flaxseed Oil 1200 MG Caps Take 1,200 mg by mouth 2 (two) times daily.   fluticasone 0.05 % cream Commonly known as: CUTIVATE 1 application daily as needed (psoriasis).   hydrALAZINE 25 MG tablet Commonly known as: APRESOLINE Take 1 tablet (25 mg total) by mouth every 8 (eight) hours.   hydrocortisone valerate cream 0.2 % Commonly known as: WESTCORT Apply topically daily as needed (to areas of psoriasis).   lidocaine 5 % Commonly known as: Lidoderm Place 1 patch onto the skin every 12 (twelve) hours. Remove & Discard patch within 12  hours or as directed by MD   LORazepam 1 MG tablet Commonly known as: ATIVAN Take 1 tablet (1 mg total) by mouth at bedtime. What changed:  medication strength how much to take how to take this when to take this   LORazepam 0.5 MG tablet Commonly known as: ATIVAN Take 1 tablet (0.5 mg total) by mouth daily. Start taking on: July 30, 2021 What changed: You were already taking a medication with the same name, and this prescription was added. Make sure you understand how and when to take each.   ondansetron 4 MG disintegrating tablet Commonly known as: Zofran ODT Take 1 tablet (4 mg total) by mouth every 8 (eight) hours as needed for nausea or vomiting.   oxyCODONE-acetaminophen 7.5-325 MG tablet Commonly known as: Percocet Take 1 tablet by mouth every 6 (six) hours as needed for severe pain.   pantoprazole 40 MG tablet Commonly known as: Protonix Take 1 tablet (40 mg total) by mouth daily.   polyethylene glycol  17 g packet Commonly known as: MiraLax Take 17 g by mouth daily.   sildenafil 100 MG tablet Commonly known as: VIAGRA Take 1 tablet (100 mg total) by mouth as needed up to once daily.   tamsulosin 0.4 MG Caps capsule Commonly known as: FLOMAX Take 1 capsule (0.4 mg total) by mouth 2 (two) times daily. What changed: when to take this   triamcinolone cream 0.1 % Commonly known as: KENALOG Apply 1 application topically 2 (two) times daily. What changed:  when to take this reasons to take this   Vitamin D 50 MCG (2000 UT) Caps Take 2,000 Units by mouth every morning.        Follow-up Information     [facility]. Schedule an appointment as soon as possible for a visit in 4 week(s).   Specialty: Neurology Contact information: [place] [phone]               Allergies  Allergen Reactions   Bee Venom Anaphylaxis   Strawberry Extract Hives   Latex Itching   Adhesive [Tape] Other (See Comments)    BLISTER   Statins Other (See Comments)    myalgias    Consultations: Neurology   Procedures/Studies: CT ABDOMEN PELVIS WO CONTRAST  Result Date: 07/29/2021 CLINICAL DATA:  Abdominal pain EXAM: CT ABDOMEN AND PELVIS WITHOUT CONTRAST TECHNIQUE: Multidetector CT imaging of the abdomen and pelvis was performed following the standard protocol without IV contrast. RADIATION DOSE REDUCTION: This exam was performed according to the departmental dose-optimization program which includes automated exposure control, adjustment of the mA and/or kV according to patient size and/or use of iterative reconstruction technique. COMPARISON:  CT abdomen/pelvis 04/28/2021, right upper quadrant ultrasound 07/27/2021 FINDINGS: Lower chest: There are patchy opacities in the lung bases. Is mild calcification of the aortic valve. Coronary artery calcification is noted in the left anterior descending artery. Hepatobiliary: The  liver is unremarkable, within the confines of noncontrast technique. There is hyperdensity within gallbladder likely reflecting a combination of stones and sludge. There is no appreciable gallbladder wall thickening and no significant pericholecystic fluid or inflammatory change. Pancreas: Unremarkable. Spleen: Unremarkable. Adrenals/Urinary Tract: The adrenals are unremarkable. Multiple right renal cysts are noted, the largest measuring up to 5.2 cm. No other focal lesions are seen, within the confines of noncontrast technique. No stones are seen. There is no hydronephrosis or hydroureter. The bladder is unremarkable. Stomach/Bowel: The stomach is unremarkable, allowing for underdistention. There  is no evidence of bowel obstruction. There is no abnormal bowel wall thickening or inflammatory change. There is colonic diverticulosis without evidence of acute diverticulitis. The appendix is not definitively identified, but there is no pericecal inflammatory change. Vascular/Lymphatic: Is extensive calcified atherosclerotic plaque throughout the nonaneurysmal abdominal aorta. There is no abdominal or pelvic lymphadenopathy. 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 The prostate is mildly enlarged, unchanged. The seminal vesicles are unremarkable. Other: There is no ascites or free air. Musculoskeletal: There is a nondisplaced fracture of the left eleventh and twelfth ribs at the costochondral junctions (3-24, 3-30). There is an additional fracture of the eleventh rib laterally (3-38). There are nondisplaced fractures of the left transverse processes at T12 and L1 (6-133, 6-124). These are new since the prior study from 04/28/2021 and may be related to the recent fall. Vertebral body heights are preserved. Post kyphoplasty changes are seen at L1. There are flowing anterior osteophytes in the imaged lower thoracic spine consistent with diffuse idiopathic skeletal hyperostosis. IMPRESSION: 1. Nondisplaced  fractures of the left eleventh and twelfth ribs (the twelfth rib is broken in two places), and left transverse processes at T12 through L3, new since the prior study from November 2022 and likely related to the recent fall. 2. Otherwise, no acute findings in the abdomen or pelvis. 3. Cholelithiasis and probable sludge without evidence of acute cholecystitis. 4. Diverticulosis without evidence of acute diverticulitis. 5. Patchy opacities in the lung bases may reflect atelectasis, though infection can not be excluded. Aortic Atherosclerosis (ICD10-I70.0). Electronically Signed   By: Valetta Mole M.D.   On: 07/29/2021 13:32   CT HEAD WO CONTRAST (5MM)  Result Date: 07/27/2021 CLINICAL DATA:  Minor head trauma.  Vomiting and syncope EXAM: CT HEAD WITHOUT CONTRAST CT CERVICAL SPINE WITHOUT CONTRAST TECHNIQUE: Multidetector CT imaging of the head and cervical spine was performed following the standard protocol without intravenous contrast. Multiplanar CT image reconstructions of the cervical spine were also generated. RADIATION DOSE REDUCTION: This exam was performed according to the departmental dose-optimization program which includes automated exposure control, adjustment of the mA and/or kV according to patient size and/or use of iterative reconstruction technique. COMPARISON:  Brain MRI from 5 days ago FINDINGS: CT HEAD FINDINGS Brain: Interval and acute appearing infarct at the left stratum and corona radiata. Large remote right MCA territory infarct affecting the upper division. Brain atrophy. No acute hemorrhage or hydrocephalus Vascular: No hyperdense vessel or unexpected calcification. Skull: Normal. Negative for fracture or focal lesion. Sinuses/Orbits: No acute finding. CT CERVICAL SPINE FINDINGS Alignment: No traumatic malalignment Skull base and vertebrae: No acute fracture Soft tissues and spinal canal: No prevertebral fluid or swelling. No visible canal hematoma. Disc levels:  Ordinary degenerative  changes. 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 These results were called by telephone at the time of interpretation on 07/27/2021 at 4:17 am to provider [facility] , who verbally acknowledged these results. IMPRESSION: 1. 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 2. No posttraumatic intracranial or cervical spine finding. 3. Remote right MCA upper division infarcts. Electronically Signed   By: Jorje Guild M.D.   On: 07/27/2021 04:17   CT HEAD WO CONTRAST  Result Date: 07/22/2021 CLINICAL DATA:  81 year old male with history of altered mental status. EXAM: CT HEAD WITHOUT CONTRAST TECHNIQUE: Contiguous axial images were obtained from the base of the skull through the vertex without intravenous contrast. RADIATION DOSE REDUCTION: This exam was performed according  to the departmental dose-optimization program which includes automated exposure control, adjustment of the mA and/or kV according to patient size and/or use of iterative reconstruction technique. COMPARISON:  Head CT 11/13/2020. FINDINGS: Brain: Mild cerebral atrophy. Again noted is a large area of low attenuation throughout the right cerebral hemisphere, compatible with extensive encephalomalacia related to remote right MCA territory infarct. Well-defined foci of low attenuation also noted in the left external capsule, and in the head of the right caudate nucleus, compatible with old lacunar infarcts. No evidence of acute infarction, hemorrhage, hydrocephalus, extra-axial collection or mass lesion/mass effect. Vascular: No hyperdense vessel or unexpected calcification. Skull: Normal. Negative for fracture or focal lesion. Sinuses/Orbits: No acute finding. Other: None. IMPRESSION: 1. No acute intracranial abnormalities. 2. 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 3.  Mild cerebral atrophy. Electronically Signed   By: Vinnie Langton M.D.   On: 07/22/2021 06:58   CT CERVICAL SPINE WO CONTRAST  Result Date: 07/27/2021 CLINICAL DATA:  Minor head trauma.  Vomiting and syncope EXAM: CT HEAD WITHOUT CONTRAST CT CERVICAL SPINE WITHOUT CONTRAST TECHNIQUE: Multidetector CT imaging of the head and cervical spine was performed following the standard protocol without intravenous contrast. Multiplanar CT image reconstructions of the cervical spine were also generated. RADIATION DOSE REDUCTION: This exam was performed according to the departmental dose-optimization program which includes automated exposure control, adjustment of the mA and/or kV according to patient size and/or use of iterative reconstruction technique. COMPARISON:  Brain MRI from 5 days ago FINDINGS: CT HEAD FINDINGS Brain: Interval and acute appearing infarct at the left stratum and corona radiata. Large remote right MCA territory infarct affecting the upper division. Brain atrophy. No acute hemorrhage or hydrocephalus Vascular: No hyperdense vessel or unexpected calcification. Skull: Normal. Negative for fracture or focal lesion. Sinuses/Orbits: No acute finding. CT CERVICAL SPINE FINDINGS Alignment: No traumatic malalignment Skull base and vertebrae: No acute fracture Soft tissues and spinal canal: No prevertebral fluid or swelling. No visible canal hematoma. Disc levels:  Ordinary degenerative changes. 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 These results were called by telephone at the time of interpretation on 07/27/2021 at 4:17 am to provider [facility] , who verbally acknowledged these results. IMPRESSION: 1. 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 2. No posttraumatic intracranial or cervical spine finding. 3. Remote right MCA upper division infarcts. Electronically Signed   By: Jorje Guild M.D.   On: 07/27/2021 04:17   MR ANGIO  HEAD WO CONTRAST  Result Date: 07/27/2021 CLINICAL DATA:  Neuro deficit, acute, stroke suspected EXAM: MRI HEAD WITHOUT CONTRAST MRA HEAD WITHOUT CONTRAST TECHNIQUE: Multiplanar, multi-echo pulse sequences of the brain and surrounding structures were acquired without intravenous contrast. Angiographic images of the Circle of Willis were acquired using MRA technique without intravenous contrast. COMPARISON:  CT head from the same day. MRA August 07, 2005 without report. 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 Associated edema without mass effect. Large remote right MCA territory infarct with encephalomalacia and surrounding gliosis. Evidence of some prior hemorrhage along the posterior aspect of this remote infarct. No evidence of acute hemorrhage. Vascular: See below. Skull and upper cervical spine: Normal marrow signal. Sinuses/Orbits: Mild paranasal sinus mucosal thickening. Unremarkable orbits. Other: No sizable mastoid effusions. MRA HEAD FINDINGS Anterior circulation: Bilateral intracranial ICAs are patent.  Moderate right paraclinoid ICA stenosis. Subtle apparent filling defect of the right paraclinoid ICA in the region of stenosis is favored to reflect flow artifact given preserved T2 hypointense flow void on MRI. Bilateral MCAs are patent. Severe distal left M1 MCA stenosis. Attenuated distal right MCA vessels in the region of remote infarct. Bilateral ACAs are patent without proximal hemodynamically significant stenosis. Posterior circulation: Only the distal most intradural vertebral arteries are visualized, which are patent. The basilar artery and bilateral posterior cerebral arteries are patent without proximal hemodynamically significant stenosis. Prominent left posterior communicating artery with small or absent left P1 PCA, anatomic variant. Anatomic variants: Detailed above. IMPRESSION: MRI: 1. Acute perforator infarct in  the left basal ganglia and overlying corona radiata. 2. Large remote right MCA territory infarct. MRA: 1. No large vessel occlusion. 2. Severe distal left M1 MCA stenosis. 3. Moderate right paraclinoid ICA stenosis. 4. Attenuated distal right MCA vessels in the region of remote infarct. Electronically Signed   By: Margaretha Sheffield M.D.   On: 07/27/2021 10:06   MR BRAIN WO CONTRAST  Result Date: 07/27/2021 CLINICAL DATA:  Neuro deficit, acute, stroke suspected EXAM: MRI HEAD WITHOUT CONTRAST MRA HEAD WITHOUT CONTRAST TECHNIQUE: Multiplanar, multi-echo pulse sequences of the brain and surrounding structures were acquired without intravenous contrast. Angiographic images of the Circle of Willis were acquired using MRA technique without intravenous contrast. COMPARISON:  CT head from the same day. MRA August 07, 2005 without report. 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 Associated edema without mass effect. Large remote right MCA territory infarct with encephalomalacia and surrounding gliosis. Evidence of some prior hemorrhage along the posterior aspect of this remote infarct. No evidence of acute hemorrhage. Vascular: See below. Skull and upper cervical spine: Normal marrow signal. Sinuses/Orbits: Mild paranasal sinus mucosal thickening. Unremarkable orbits. Other: No sizable mastoid effusions. MRA HEAD FINDINGS Anterior circulation: Bilateral intracranial ICAs are patent. Moderate right paraclinoid ICA stenosis. Subtle apparent filling defect of the right paraclinoid ICA in the region of stenosis is favored to reflect flow artifact given preserved T2 hypointense flow void on MRI. Bilateral MCAs are patent. Severe distal left M1 MCA stenosis. Attenuated distal right MCA vessels in the region of remote infarct. Bilateral ACAs are patent without proximal hemodynamically significant stenosis. Posterior circulation: Only the distal most  intradural vertebral arteries are visualized, which are patent. The basilar artery and bilateral posterior cerebral arteries are patent without proximal hemodynamically significant stenosis. Prominent left posterior communicating artery with small or absent left P1 PCA, anatomic variant. Anatomic variants: Detailed above. IMPRESSION: MRI: 1. 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 2. Large remote right MCA territory infarct. MRA: 1. No large vessel occlusion. 2. Severe distal left M1 MCA stenosis. 3. Moderate right paraclinoid ICA stenosis. 4. Attenuated distal right MCA vessels in the region of remote infarct. Electronically Signed   By: Margaretha Sheffield M.D.   On: 07/27/2021 10:06   MR BRAIN WO CONTRAST  Result Date: 07/22/2021 CLINICAL DATA:  Neuro deficit, acute, stroke suspected Patient had worsened left-sided facial droop and altered mental status this morning. Now improving. Rule out acute stroke. EXAM: MRI HEAD WITHOUT CONTRAST TECHNIQUE: Multiplanar, multiecho pulse sequences of the brain and surrounding structures were obtained without intravenous contrast. COMPARISON:  Same day CT head.  MRI head August 14, 2020. FINDINGS: Motion limited study. Brain: Similar large area predominately right MCA territory encephalomalacia. Surrounding gliosis. Similar small remote  lacunar infarct in the left caudate. Additional patchy white matter T2/FLAIR hyperintensity, compatible with chronic microvascular disease. No evidence of acute infarct, hydrocephalus, mass lesion, midline shift, extra-axial fluid collection, or or acute hemorrhage. Vascular: Major arterial flow voids are maintained at the skull base. Skull and upper cervical spine: Normal marrow signal. Sinuses/Orbits: Mild paranasal sinus mucosal thickening. Unremarkable orbits. Other: No mastoid effusions. IMPRESSION: 1. No evidence of acute abnormality on this motion limited study. 2. Similar large remote right MCA  territory and small left caudate lacunar infarcts. Electronically Signed   By: Margaretha Sheffield M.D.   On: 07/22/2021 13:02   NM Hepatobiliary Liver Func  Result Date: 07/27/2021 CLINICAL DATA:  Right upper quadrant abdominal pain, with sonographic Murphy sign, gallstones, and gallbladder wall thickening on ultrasound. EXAM: NUCLEAR MEDICINE HEPATOBILIARY IMAGING TECHNIQUE: Sequential images of the abdomen were obtained out to 60 minutes following intravenous administration of radiopharmaceutical. RADIOPHARMACEUTICALS:  5.3 mCi Tc-58m  Choletec IV COMPARISON:  Abdominal ultrasound 07/27/2021 FINDINGS: There is satisfactory uptake of radiopharmaceutical from the blood pool. Biliary activity is visible 15 minutes. Gallbladder activity visible by 20 minutes. Consecutive images show filling of the gallbladder. Bowel activity is shown by 70 minutes. IMPRESSION: 1. Currently the cystic duct is patent, with gallbladder activity visible 20 minutes after injection. The CBD is also patent, with bowel activity visible at 70 minutes and thereafter. Electronically Signed   By: Van Clines M.D.   On: 07/27/2021 15:07   DG Chest Port 1 View  Result Date: 07/27/2021 CLINICAL DATA:  Fall EXAM: PORTABLE CHEST 1 VIEW COMPARISON:  11/13/2020 FINDINGS: Heart and mediastinal contours are within normal limits. No focal opacities or effusions. No acute bony abnormality. No visible rib fracture or pneumothorax. IMPRESSION: No active disease. Electronically Signed   By: Rolm Baptise M.D.   On: 07/27/2021 03:27   EEG adult  Result Date: 07/27/2021 Greta Doom, MD     07/28/2021  9:13 AM History: 81 yo M being evaluated for encephalopathy Sedation: none Technique: This EEG was acquired with electrodes placed according to the International 10-20 electrode system (including Fp1, Fp2, F3, F4, C3, C4, P3, P4, O1, O2, T3, T4, T5, T6, A1, A2, Fz, Cz, Pz). The following electrodes were missing or displaced: none.  Background: There is a poorly formed and poorly sustained posterior dominant rhythm achieving a maximal frequency of 9-10 Hz. In addition there is diffuse generalized irregular delta and theta range activity seen throughout the recording. Well formed sleep is not recorded. No epileptiform discharges were seen. Photic stimulation: Physiologic driving is not performed EEG Abnormalities: 1) Generalized irregular slow activity. Clinical Interpretation: This EEG is consistent with a generalized non-specific cerebral dysfunction(encephalopathy). There was no seizure or seizure predisposition recorded on this study. Please note that lack of epileptiform activity on EEG does not preclude the possibility of epilepsy. Roland Rack, MD [facility] [phone] If 7pm- 7am, please page neurology on call as listed in [place].   ECHOCARDIOGRAM COMPLETE  Result Date: 07/27/2021    ECHOCARDIOGRAM REPORT   Patient Name:   ROCKEY GUARINO Date of Exam: 07/27/2021 Medical Rec #:  [id number]      Height:       71.0 in Accession #:    [id number]     Weight:       213.0 lb Date of Birth:  1941/04/06      BSA:          2.166 m Patient Age:    27 years  BP:           144/75 mmHg Patient Gender: M              HR:           86 bpm. Exam Location:  Inpatient Procedure: 2D Echo, Cardiac Doppler, Color Doppler and Intracardiac            Opacification Agent Indications:     Stroke  History:         Patient has no prior history of Echocardiogram examinations.  Sonographer:     Jyl Heinz Referring Phys:  [id number] RONDELL A Matzek Diagnosing Phys: Oswaldo Milian MD IMPRESSIONS  1. Left ventricular ejection fraction, by estimation, is 60 to 65%. The left ventricle has normal function. The left ventricle has no regional wall motion abnormalities. There is mild left ventricular hypertrophy. Left ventricular diastolic parameters are consistent with Grade I diastolic dysfunction (impaired relaxation).  2. Right  ventricular systolic function is normal. The right ventricular size is normal. Tricuspid regurgitation signal is inadequate for assessing PA pressure.  3. The mitral valve is grossly normal. No evidence of mitral valve regurgitation. No evidence of mitral stenosis.  4. The aortic valve was not well visualized. Aortic valve regurgitation is not visualized. No aortic stenosis is present. FINDINGS  Left Ventricle: Left ventricular ejection fraction, by estimation, is 60 to 65%. The left ventricle has normal function. The left ventricle has no regional wall motion abnormalities. The left ventricular internal cavity size was normal in size. There is  mild left ventricular hypertrophy. Left ventricular diastolic parameters are consistent with Grade I diastolic dysfunction (impaired relaxation). Right Ventricle: The right ventricular size is normal. Right vetricular wall thickness was not well visualized. Right ventricular systolic function is normal. Tricuspid regurgitation signal is inadequate for assessing PA pressure. Left Atrium: Left atrial size was normal in size. Right Atrium: Right atrial size was normal in size. Pericardium: There is no evidence of pericardial effusion. Presence of epicardial fat layer. Mitral Valve: The mitral valve is grossly normal. No evidence of mitral valve regurgitation. No evidence of mitral valve stenosis. Tricuspid Valve: The tricuspid valve is normal in structure. Tricuspid valve regurgitation is trivial. Aortic Valve: The aortic valve was not well visualized. Aortic valve regurgitation is not visualized. No aortic stenosis is present. Aortic valve peak gradient measures 11.8 mmHg. Pulmonic Valve: The pulmonic valve was not well visualized. Pulmonic valve regurgitation is not visualized. Aorta: The aortic root and ascending aorta are structurally normal, with no evidence of dilitation. IAS/Shunts: The interatrial septum was not well visualized.  LEFT VENTRICLE PLAX 2D LVIDd:          4.20 cm      Diastology LVIDs:         2.80 cm      LV e' medial:    4.79 cm/s LV PW:         1.40 cm      LV E/e' medial:  11.0 LV IVS:        1.20 cm      LV e' lateral:   7.07 cm/s LVOT diam:     2.00 cm      LV E/e' lateral: 7.4 LV SV:         60 LV SV Index:   28 LVOT Area:     3.14 cm  LV Volumes (MOD) LV vol d, MOD A2C: 105.0 ml LV vol d, MOD A4C: 118.0 ml LV vol s, MOD A2C: 36.7  ml LV vol s, MOD A4C: 42.4 ml LV SV MOD A2C:     68.3 ml LV SV MOD A4C:     118.0 ml LV SV MOD BP:      74.1 ml RIGHT VENTRICLE RV Basal diam:  3.50 cm RV Mid diam:    2.70 cm RV S prime:     14.50 cm/s TAPSE (M-mode): 2.2 cm LEFT ATRIUM             Index        RIGHT ATRIUM           Index LA diam:        3.40 cm 1.57 cm/m   RA Area:     15.00 cm LA Vol (A2C):   42.5 ml 19.62 ml/m  RA Volume:   36.30 ml  16.76 ml/m LA Vol (A4C):   66.2 ml 30.57 ml/m LA Biplane Vol: 55.0 ml 25.40 ml/m  AORTIC VALVE AV Area (Vmax): 2.03 cm AV Vmax:        172.00 cm/s AV Peak Grad:   11.8 mmHg LVOT Vmax:      111.00 cm/s LVOT Vmean:     73.500 cm/s LVOT VTI:       0.190 m  AORTA Ao Root diam: 3.80 cm Ao Asc diam:  3.30 cm MITRAL VALVE MV Area (PHT): 4.44 cm    SHUNTS MV Decel Time: 171 msec    Systemic VTI:  0.19 m MV E velocity: 52.50 cm/s  Systemic Diam: 2.00 cm MV A velocity: 75.50 cm/s MV E/A ratio:  0.70 Oswaldo Milian MD Electronically signed by Oswaldo Milian MD Signature Date/Time: 07/27/2021/3:59:23 PM    Final (Updated)    VAS US CAROTID  Result Date: 07/27/2021 Carotid Arterial Duplex Study Patient Name:  FLORA RATZ  Date of Exam:   07/27/2021 Medical Rec #: [id number]       Accession #:    [id number] Date of Birth: September 01, 1940       Patient Gender: M Patient Age:   30 years Exam Location:  [facility] Procedure:      VAS US CAROTID Referring Phys: Joseph Berkshire --------------------------------------------------------------------------------  Indications:       CVA, Carotid artery disease and bilateral  endarterectomies                    (right 2006, left 2010). Risk Factors:      Hypertension, past history of smoking, prior CVA. Other Factors:     History of bladder cancer. Comparison Study:  Prior Carotid duplex done 01/30/20 indicated 1-39% ICA                    stenosis bilaterally and patent CEAs. Performing Technologist: Sharion Dove RVS  Examination Guidelines: A complete evaluation includes B-mode imaging, spectral Doppler, color Doppler, and power Doppler as needed of all accessible portions of each vessel. Bilateral testing is considered an integral part of a complete examination. Limited examinations for reoccurring indications may be performed as noted.  Right Carotid Findings: +----------+--------+--------+--------+------------------+--------+             PSV cm/s EDV cm/s Stenosis Plaque Description Comments  +----------+--------+--------+--------+------------------+--------+  CCA Prox   78       16                                             +----------+--------+--------+--------+------------------+--------+  CCA Distal 94       19                                             +----------+--------+--------+--------+------------------+--------+  ICA Prox   44       11       1-39%    heterogenous                 +----------+--------+--------+--------+------------------+--------+  ICA Distal 67       20                                             +----------+--------+--------+--------+------------------+--------+  ECA        97       22                                             +----------+--------+--------+--------+------------------+--------+ +----------+--------+-------+--------+-------------------+             PSV cm/s EDV cms Describe Arm Pressure (mmHG)  +----------+--------+-------+--------+-------------------+  Subclavian 167                                            +----------+--------+-------+--------+-------------------+ +---------+--------+--+--------+--+  Vertebral PSV cm/s 67 EDV  cm/s 20  +---------+--------+--+--------+--+  Left Carotid Findings: +----------+--------+--------+--------+------------------+------------------+             PSV cm/s EDV cm/s Stenosis Plaque Description Comments            +----------+--------+--------+--------+------------------+------------------+  CCA Prox   148      18                                   intimal thickening  +----------+--------+--------+--------+------------------+------------------+  CCA Distal 108      14                                   intimal thickening  +----------+--------+--------+--------+------------------+------------------+  ICA Prox   48       14       1-39%    heterogenous                           +----------+--------+--------+--------+------------------+------------------+  ICA Distal 92       25                                                       +----------+--------+--------+--------+------------------+------------------+  ECA        123      21                                                       +----------+--------+--------+--------+------------------+------------------+ +----------+--------+--------+--------+-------------------+  PSV cm/s EDV cm/s Describe Arm Pressure (mmHG)  +----------+--------+--------+--------+-------------------+  Subclavian 93                                              +----------+--------+--------+--------+-------------------+ +---------+--------+--+--------+--+  Vertebral PSV cm/s 62 EDV cm/s 17  +---------+--------+--+--------+--+   Summary: Right Carotid: Velocities in the right ICA are consistent with a 1-39% stenosis. Left Carotid: There is no evidence of stenosis in the left ICA. Vertebrals:  Bilateral vertebral arteries demonstrate antegrade flow. Subclavians: Normal flow hemodynamics were seen in bilateral subclavian              arteries. *See table(s) above for measurements and observations.     Preliminary    VAS [place] TRANSCRANIAL DOPPLER  Result Date: 07/27/2021   Transcranial Doppler Patient Name:  TADASHI BURKEL  Date of Exam:   07/27/2021 Medical Rec #: [id number]       Accession #:    [id number] Date of Birth: Jan 14, 1941       Patient Gender: M Patient Age:   61 years Exam Location:  [facility] Procedure:      VAS [place] TRANSCRANIAL DOPPLER Referring Phys: PRAMOD SETHI --------------------------------------------------------------------------------  Indications: Stroke. Comparison Study: No prior study Performing Technologist: Sharion Dove RVS  Examination Guidelines: A complete evaluation includes B-mode imaging, spectral Doppler, color Doppler, and power Doppler as needed of all accessible portions of each vessel. Bilateral testing is considered an integral part of a complete examination. Limited examinations for reoccurring indications may be performed as noted.  +----------+-------------+----------+-----------+-------+  RIGHT TCD  Right VM (cm) Depth (cm) Pulsatility Comment  +----------+-------------+----------+-----------+-------+  MCA            51.00                   1.29              +----------+-------------+----------+-----------+-------+  ACA           -36.00                   1.20              +----------+-------------+----------+-----------+-------+  Term ICA       51.00                   1.28              +----------+-------------+----------+-----------+-------+  PCA            25.00                   1.14              +----------+-------------+----------+-----------+-------+  Opthalmic      30.00                   1.30              +----------+-------------+----------+-----------+-------+  ICA siphon     22.00                   1.22              +----------+-------------+----------+-----------+-------+  Vertebral      36.00                   1.00              +----------+-------------+----------+-----------+-------+  +----------+------------+----------+-----------+------------------+  LEFT TCD   Left VM (cm) Depth (cm) Pulsatility      Comment         +----------+------------+----------+-----------+------------------+  MCA           101.00       4.30       1.56                         +----------+------------+----------+-----------+------------------+  ACA                                            Unable to insonate  +----------+------------+----------+-----------+------------------+  Term ICA      55.00                   1.51                         +----------+------------+----------+-----------+------------------+  PCA           34.00                   1.16                         +----------+------------+----------+-----------+------------------+  Opthalmic     25.00                   1.26                         +----------+------------+----------+-----------+------------------+  ICA siphon    23.00                   1.58                         +----------+------------+----------+-----------+------------------+  Vertebral     -21.00                  1.52                         +----------+------------+----------+-----------+------------------+  +------------+------+-------+               VM cm  Comment  +------------+------+-------+  Prox Basilar -27.00          +------------+------+-------+    Preliminary    US ABDOMEN LIMITED RUQ (LIVER/GB)  Result Date: 07/27/2021 CLINICAL DATA:  Abdominal pain with elevated LFTs. EXAM: ULTRASOUND ABDOMEN LIMITED RIGHT UPPER QUADRANT COMPARISON:  CT without contrast 04/28/2021 FINDINGS: Gallbladder: The gallbladder is filled with stones, largest measurable stone approaching 1.5 cm. There is a thickened anterior free wall measuring 5 mm with a positive sonographic Murphy sign. This is concerning for acute cholecystitis. Common bile duct: Diameter: Unable to visualize. Liver: No focal lesion identified. There is increased hepatic echogenicity consistent with steatosis. Portal vein is patent on color Doppler imaging with normal direction of blood flow towards the liver. Other: 5 cm simple appearing cyst again noted in  the upper pole right kidney. No free fluid is evident. IMPRESSION: 1. Mild thickening of the gallbladder with positive sonographic Murphy sign and multiple stones. Findings concerning for acute cholecystitis. No pericholecystic fluid. 2. Fatty liver. 3. Unable to visualize the bile ducts due to shadowing by the gallbladder and due to bowel gas. 4.  5 cm simple cyst right kidney. Electronically Signed   By: Telford Nab M.D.   On: 07/27/2021 06:10      Subjective: Patient seen and examined at the bedside this morning.  Hemodynamically stable.  Family at the bedside.  Sitting in the chair.  Comfortable.  Denies any new complaints.  Still little confused but medically stable for discharge to CIR today  Discharge Exam: Vitals:   07/29/21 0832 07/29/21 1202  BP: (!) 167/67 (!) 124/59  Pulse: 72 73  Resp: 18 18  Temp: 98.4 F (36.9 C) 98 F (36.7 C)  SpO2: 97% 97%   Vitals:   07/29/21 0415 07/29/21 0800 07/29/21 0832 07/29/21 1202  BP: (!) 148/64  (!) 167/67 (!) 124/59  Pulse: 78  72 73  Resp: 18 16 18 18   Temp: 98.6 F (37 C)  98.4 F (36.9 C) 98 F (36.7 C)  TempSrc: Oral  Oral Oral  SpO2: 98%  97% 97%    General: Pt is alert, awake, not in acute distress Cardiovascular: RRR, S1/S2 +, no rubs, no gallops Respiratory: CTA bilaterally, no wheezing, no rhonchi Abdominal: Soft, NT, ND, bowel sounds + Extremities: no edema, no cyanosis    The results of significant diagnostics from this hospitalization (including imaging, microbiology, ancillary and laboratory) are listed below for reference.     Microbiology: Recent Results (from the past 240 hour(s))  Resp Panel by RT-PCR (Flu A&B, Covid) Nasopharyngeal Swab     Status: None   Collection Time: 07/22/21  6:20 AM   Specimen: Nasopharyngeal Swab; Nasopharyngeal(NP) swabs in vial transport medium  Result Value Ref Range Status   SARS Coronavirus 2 by RT PCR NEGATIVE NEGATIVE Final    Comment: (NOTE) SARS-CoV-2 target nucleic  acids are NOT DETECTED.  The SARS-CoV-2 RNA is generally detectable in upper respiratory specimens during the acute phase of infection. The lowest concentration of SARS-CoV-2 viral copies this assay can detect is 138 copies/mL. A negative result does not preclude SARS-Cov-2 infection and should not be used as the sole basis for treatment or other patient management decisions. A negative result may occur with  improper specimen collection/handling, submission of specimen other than nasopharyngeal swab, presence of viral mutation(s) within the areas targeted by this assay, and inadequate number of viral copies(<138 copies/mL). A negative result must be combined with clinical observations, patient history, and epidemiological information. The expected result is Negative.  Fact Sheet for Patients:  EntrepreneurPulse.com.au  Fact Sheet for Healthcare Providers:  IncredibleEmployment.be  This test is no t yet approved or cleared by the [place] FDA and  has been authorized for detection and/or diagnosis of SARS-CoV-2 by FDA under an Emergency Use Authorization (EUA). This EUA will remain  in effect (meaning this test can be used) for the duration of the COVID-19 declaration under Section 564(b)(1) of the Act, 21 U.S.C.section 360bbb-3(b)(1), unless the authorization is terminated  or revoked sooner.       Influenza A by PCR NEGATIVE NEGATIVE Final   Influenza B by PCR NEGATIVE NEGATIVE Final    Comment: (NOTE) The Xpert Xpress SARS-CoV-2/FLU/RSV plus assay is intended as an aid in the diagnosis of influenza from Nasopharyngeal swab specimens and should not be used as a sole basis for treatment. Nasal washings and aspirates are unacceptable for Xpert Xpress SARS-CoV-2/FLU/RSV testing.  Fact Sheet for Patients: EntrepreneurPulse.com.au  Fact Sheet for Healthcare Providers: IncredibleEmployment.be  This  test is not yet approved or cleared by the [place] FDA and has been  authorized for detection and/or diagnosis of SARS-CoV-2 by FDA under an Emergency Use Authorization (EUA). This EUA will remain in effect (meaning this test can be used) for the duration of the COVID-19 declaration under Section 564(b)(1) of the Act, 21 U.S.C. section 360bbb-3(b)(1), unless the authorization is terminated or revoked.  Performed at [facility], [place] [address] [place], [place] [address]   Urine Culture     Status: None   Collection Time: 07/22/21 11:56 AM   Specimen: Urine, Clean Catch  Result Value Ref Range Status   Specimen Description URINE, CLEAN CATCH  Final   Special Requests NONE  Final   Culture   Final    NO GROWTH Performed at [facility], [place] [address] [place], [place] [address]    Report Status 07/23/2021 FINAL  Final  Resp Panel by RT-PCR (Flu A&B, Covid) Nasopharyngeal Swab     Status: None   Collection Time: 07/27/21  3:34 AM   Specimen: Nasopharyngeal Swab; Nasopharyngeal(NP) swabs in vial transport medium  Result Value Ref Range Status   SARS Coronavirus 2 by RT PCR NEGATIVE NEGATIVE Final    Comment: (NOTE) SARS-CoV-2 target nucleic acids are NOT DETECTED.  The SARS-CoV-2 RNA is generally detectable in upper respiratory specimens during the acute phase of infection. The lowest concentration of SARS-CoV-2 viral copies this assay can detect is 138 copies/mL. A negative result does not preclude SARS-Cov-2 infection and should not be used as the sole basis for treatment or other patient management decisions. A negative result may occur with  improper specimen collection/handling, submission of specimen other than nasopharyngeal swab, presence of viral mutation(s) within the areas targeted by this assay, and inadequate number of viral copies(<138 copies/mL). A negative result must be combined with clinical observations, patient history, and  epidemiological information. The expected result is Negative.  Fact Sheet for Patients:  EntrepreneurPulse.com.au  Fact Sheet for Healthcare Providers:  IncredibleEmployment.be  This test is no t yet approved or cleared by the [place] FDA and  has been authorized for detection and/or diagnosis of SARS-CoV-2 by FDA under an Emergency Use Authorization (EUA). This EUA will remain  in effect (meaning this test can be used) for the duration of the COVID-19 declaration under Section 564(b)(1) of the Act, 21 U.S.C.section 360bbb-3(b)(1), unless the authorization is terminated  or revoked sooner.       Influenza A by PCR NEGATIVE NEGATIVE Final   Influenza B by PCR NEGATIVE NEGATIVE Final    Comment: (NOTE) The Xpert Xpress SARS-CoV-2/FLU/RSV plus assay is intended as an aid in the diagnosis of influenza from Nasopharyngeal swab specimens and should not be used as a sole basis for treatment. Nasal washings and aspirates are unacceptable for Xpert Xpress SARS-CoV-2/FLU/RSV testing.  Fact Sheet for Patients: EntrepreneurPulse.com.au  Fact Sheet for Healthcare Providers: IncredibleEmployment.be  This test is not yet approved or cleared by the [place] FDA and has been authorized for detection and/or diagnosis of SARS-CoV-2 by FDA under an Emergency Use Authorization (EUA). This EUA will remain in effect (meaning this test can be used) for the duration of the COVID-19 declaration under Section 564(b)(1) of the Act, 21 U.S.C. section 360bbb-3(b)(1), unless the authorization is terminated or revoked.  Performed at [facility], [place] [address] [place], [place] [address]   Urine Culture     Status: None   Collection Time: 07/27/21  6:14 AM   Specimen: Urine, Clean Catch  Result Value Ref Range Status  Specimen Description URINE, CLEAN CATCH  Final   Special Requests NONE  Final   Culture    Final    NO GROWTH Performed at [facility], [place] [address], [place], [place] [address]    Report Status 07/28/2021 FINAL  Final     Labs: BNP (last 3 results) No results for input(s): BNP in the last 8760 hours. Basic Metabolic Panel: Recent Labs  Lab 07/27/21 0307 07/28/21 0435 07/29/21 0339  NA 141 141 139  K 4.2 4.0 3.8  CL 106 110 112*  CO2 24 22 21*  GLUCOSE 213* 117* 106*  BUN 32* 33* 26*  CREATININE 2.47* 2.50* 2.02*  CALCIUM 9.1 9.0 8.4*   Liver Function Tests: Recent Labs  Lab 07/27/21 0307 07/28/21 0435  AST 226* 92*  ALT 115* 101*  ALKPHOS 122 85  BILITOT 1.2 0.7  PROT 6.9 6.0*  ALBUMIN 3.3* 2.8*   Recent Labs  Lab 07/27/21 0307  LIPASE 59*   No results for input(s): AMMONIA in the last 168 hours. CBC: Recent Labs  Lab 07/27/21 0307 07/27/21 1619 07/28/21 0435 07/29/21 0339  WBC 9.8  --  10.7* 10.0  NEUTROABS 7.9*  --   --  6.8  HGB 11.5* 11.0* 9.4* 8.9*  HCT 34.5* 32.8* 28.8* 26.1*  MCV 98.3  --  99.3 96.3  PLT 244  --  187 168   Cardiac Enzymes: Recent Labs  Lab 07/27/21 0717  CKTOTAL 67   BNP: Invalid input(s): POCBNP CBG: Recent Labs  Lab 07/23/21 1130  GLUCAP 155*   D-Dimer No results for input(s): DDIMER in the last 72 hours. Hgb A1c Recent Labs    07/27/21 0307  HGBA1C 5.3   Lipid Profile Recent Labs    07/27/21 0717  CHOL 209*  HDL 69  LDLCALC 125*  TRIG 73  CHOLHDL 3.0   Thyroid function studies Recent Labs    07/27/21 1619  TSH 0.555   Anemia work up No results for input(s): VITAMINB12, FOLATE, FERRITIN, TIBC, IRON, RETICCTPCT in the last 72 hours. Urinalysis    Component Value Date/Time   COLORURINE YELLOW 07/27/2021 0326   APPEARANCEUR CLEAR 07/27/2021 0326   LABSPEC 1.017 07/27/2021 0326   PHURINE 6.0 07/27/2021 0326   GLUCOSEU NEGATIVE 07/27/2021 0326   HGBUR NEGATIVE 07/27/2021 0326   HGBUR negative 09/25/2009 1535   BILIRUBINUR NEGATIVE 07/27/2021 0326   BILIRUBINUR neg  09/12/2019 1600   KETONESUR 5 (A) 07/27/2021 0326   PROTEINUR 30 (A) 07/27/2021 0326   UROBILINOGEN 0.2 09/12/2019 1600   UROBILINOGEN 0.2 09/25/2009 1535   NITRITE NEGATIVE 07/27/2021 0326   LEUKOCYTESUR MODERATE (A) 07/27/2021 0326   Sepsis Labs Invalid input(s): PROCALCITONIN,  WBC,  LACTICIDVEN Microbiology Recent Results (from the past 240 hour(s))  Resp Panel by RT-PCR (Flu A&B, Covid) Nasopharyngeal Swab     Status: None   Collection Time: 07/22/21  6:20 AM   Specimen: Nasopharyngeal Swab; Nasopharyngeal(NP) swabs in vial transport medium  Result Value Ref Range Status   SARS Coronavirus 2 by RT PCR NEGATIVE NEGATIVE Final    Comment: (NOTE) SARS-CoV-2 target nucleic acids are NOT DETECTED.  The SARS-CoV-2 RNA is generally detectable in upper respiratory specimens during the acute phase of infection. The lowest concentration of SARS-CoV-2 viral copies this assay can detect is 138 copies/mL. A negative result does not preclude SARS-Cov-2 infection and should not be used as the sole basis for treatment or other patient management decisions. A negative result may occur with  improper specimen collection/handling, submission of specimen other than nasopharyngeal swab, presence of viral mutation(s) within the areas targeted by this assay, and inadequate number of viral copies(<138 copies/mL). A negative result must be combined with clinical observations, patient history, and epidemiological information. The expected result is Negative.  Fact Sheet for Patients:  EntrepreneurPulse.com.au  Fact Sheet for Healthcare Providers:  IncredibleEmployment.be  This test is no t yet approved or cleared by the [place] FDA and  has been authorized for detection and/or diagnosis of SARS-CoV-2 by FDA under an Emergency Use Authorization (EUA). This EUA will remain  in effect (meaning this test can be used) for the duration of the COVID-19  declaration under Section 564(b)(1) of the Act, 21 U.S.C.section 360bbb-3(b)(1), unless the authorization is terminated  or revoked sooner.       Influenza A by PCR NEGATIVE NEGATIVE Final   Influenza B by PCR NEGATIVE NEGATIVE Final    Comment: (NOTE) The Xpert Xpress SARS-CoV-2/FLU/RSV plus assay is intended as an aid in the diagnosis of influenza from Nasopharyngeal swab specimens and should not be used as a sole basis for treatment. Nasal washings and aspirates are unacceptable for Xpert Xpress SARS-CoV-2/FLU/RSV testing.  Fact Sheet for Patients: EntrepreneurPulse.com.au  Fact Sheet for Healthcare Providers: IncredibleEmployment.be  This test is not yet approved or cleared by the [place] FDA and has been authorized for detection and/or diagnosis of SARS-CoV-2 by FDA under an Emergency Use Authorization (EUA). This EUA will remain in effect (meaning this test can be used) for the duration of the COVID-19 declaration under Section 564(b)(1) of the Act, 21 U.S.C. section 360bbb-3(b)(1), unless the authorization is terminated or revoked.  Performed at [facility], [place] [address], [place], [place] [address]   Urine Culture     Status: None   Collection Time: 07/22/21 11:56 AM   Specimen: Urine, Clean Catch  Result Value Ref Range Status   Specimen Description URINE, CLEAN CATCH  Final   Special Requests NONE  Final   Culture   Final    NO GROWTH Performed at [facility], [place] [address], [place], [place] [address]    Report Status 07/23/2021 FINAL  Final  Resp Panel by RT-PCR (Flu A&B, Covid) Nasopharyngeal Swab     Status: None   Collection Time: 07/27/21  3:34 AM   Specimen: Nasopharyngeal Swab; Nasopharyngeal(NP) swabs in vial transport medium  Result Value Ref Range Status   SARS Coronavirus 2 by RT PCR NEGATIVE NEGATIVE Final    Comment: (NOTE) SARS-CoV-2 target nucleic acids are NOT DETECTED.  The  SARS-CoV-2 RNA is generally detectable in upper respiratory specimens during the acute phase of infection. The lowest concentration of SARS-CoV-2 viral copies this assay can detect is 138 copies/mL. A negative result does not preclude SARS-Cov-2 infection and should not be used as the sole basis for treatment or other patient management decisions. A negative result may occur with  improper specimen collection/handling, submission of specimen other than nasopharyngeal swab, presence of viral mutation(s) within the areas targeted by this assay, and inadequate number of viral copies(<138 copies/mL). A negative result must be combined with clinical observations, patient history, and epidemiological information. The expected result is Negative.  Fact Sheet for Patients:  EntrepreneurPulse.com.au  Fact Sheet for Healthcare Providers:  IncredibleEmployment.be  This test is no t yet approved or cleared by the [place] FDA and  has been authorized for detection and/or diagnosis of SARS-CoV-2 by FDA under an Emergency Use Authorization (EUA).  This EUA will remain  in effect (meaning this test can be used) for the duration of the COVID-19 declaration under Section 564(b)(1) of the Act, 21 U.S.C.section 360bbb-3(b)(1), unless the authorization is terminated  or revoked sooner.       Influenza A by PCR NEGATIVE NEGATIVE Final   Influenza B by PCR NEGATIVE NEGATIVE Final    Comment: (NOTE) The Xpert Xpress SARS-CoV-2/FLU/RSV plus assay is intended as an aid in the diagnosis of influenza from Nasopharyngeal swab specimens and should not be used as a sole basis for treatment. Nasal washings and aspirates are unacceptable for Xpert Xpress SARS-CoV-2/FLU/RSV testing.  Fact Sheet for Patients: EntrepreneurPulse.com.au  Fact Sheet for Healthcare Providers: IncredibleEmployment.be  This test is not yet approved or  cleared by the [place] FDA and has been authorized for detection and/or diagnosis of SARS-CoV-2 by FDA under an Emergency Use Authorization (EUA). This EUA will remain in effect (meaning this test can be used) for the duration of the COVID-19 declaration under Section 564(b)(1) of the Act, 21 U.S.C. section 360bbb-3(b)(1), unless the authorization is terminated or revoked.  Performed at [facility], [place] [address] [place], [place] [address]   Urine Culture     Status: None   Collection Time: 07/27/21  6:14 AM   Specimen: Urine, Clean Catch  Result Value Ref Range Status   Specimen Description URINE, CLEAN CATCH  Final   Special Requests NONE  Final   Culture   Final    NO GROWTH Performed at [facility], [place] [address] [place], [place] [address]    Report Status 07/28/2021 FINAL  Final    Please note: You were cared for by a hospitalist during your hospital stay. Once you are discharged, your primary care physician will handle any further medical issues. Please note that NO REFILLS for any discharge medications will be authorized once you are discharged, as it is imperative that you return to your primary care physician (or establish a relationship with a primary care physician if you do not have one) for your post hospital discharge needs so that they can reassess your need for medications and monitor your lab values.    Time coordinating discharge: 40 minutes  SIGNED:   Shelly Coss, MD  [facility] 07/29/2021, 1:44 PM Pager [phone]  If 7PM-7AM, please contact night-coverage [email or web address] Password [email or web address]

## 2021-07-30 ENCOUNTER — Other Ambulatory Visit: Payer: PPO

## 2021-07-30 LAB — GLUCOSE, CAPILLARY: Glucose-Capillary: 129 mg/dL — ABNORMAL HIGH (ref 70–99)

## 2021-07-30 LAB — CBC WITH DIFFERENTIAL/PLATELET
Abs Immature Granulocytes: 0.04 10*3/uL (ref 0.00–0.07)
Basophils Absolute: 0 10*3/uL (ref 0.0–0.1)
Basophils Relative: 1 %
Eosinophils Absolute: 0.4 10*3/uL (ref 0.0–0.5)
Eosinophils Relative: 5 %
HCT: 27.7 % — ABNORMAL LOW (ref 39.0–52.0)
Hemoglobin: 9.1 g/dL — ABNORMAL LOW (ref 13.0–17.0)
Immature Granulocytes: 1 %
Lymphocytes Relative: 17 %
Lymphs Abs: 1.5 10*3/uL (ref 0.7–4.0)
MCH: 31.6 pg (ref 26.0–34.0)
MCHC: 32.9 g/dL (ref 30.0–36.0)
MCV: 96.2 fL (ref 80.0–100.0)
Monocytes Absolute: 0.8 10*3/uL (ref 0.1–1.0)
Monocytes Relative: 9 %
Neutro Abs: 6 10*3/uL (ref 1.7–7.7)
Neutrophils Relative %: 67 %
Platelets: 184 10*3/uL (ref 150–400)
RBC: 2.88 MIL/uL — ABNORMAL LOW (ref 4.22–5.81)
RDW: 13.2 % (ref 11.5–15.5)
WBC: 8.8 10*3/uL (ref 4.0–10.5)
nRBC: 0 % (ref 0.0–0.2)

## 2021-07-30 LAB — URINALYSIS, ROUTINE W REFLEX MICROSCOPIC
Bilirubin Urine: NEGATIVE
Glucose, UA: NEGATIVE mg/dL
Ketones, ur: NEGATIVE mg/dL
Nitrite: NEGATIVE
Protein, ur: NEGATIVE mg/dL
Specific Gravity, Urine: 1.01 (ref 1.005–1.030)
pH: 5 (ref 5.0–8.0)

## 2021-07-30 LAB — COMPREHENSIVE METABOLIC PANEL
ALT: 48 U/L — ABNORMAL HIGH (ref 0–44)
AST: 26 U/L (ref 15–41)
Albumin: 2.7 g/dL — ABNORMAL LOW (ref 3.5–5.0)
Alkaline Phosphatase: 65 U/L (ref 38–126)
Anion gap: 9 (ref 5–15)
BUN: 23 mg/dL (ref 8–23)
CO2: 23 mmol/L (ref 22–32)
Calcium: 8.9 mg/dL (ref 8.9–10.3)
Chloride: 109 mmol/L (ref 98–111)
Creatinine, Ser: 2.05 mg/dL — ABNORMAL HIGH (ref 0.61–1.24)
GFR, Estimated: 32 mL/min — ABNORMAL LOW (ref >60)
Glucose, Bld: 121 mg/dL — ABNORMAL HIGH (ref 70–99)
Potassium: 3.8 mmol/L (ref 3.5–5.1)
Sodium: 141 mmol/L (ref 135–145)
Total Bilirubin: 0.5 mg/dL (ref 0.3–1.2)
Total Protein: 5.9 g/dL — ABNORMAL LOW (ref 6.5–8.1)

## 2021-07-30 LAB — VITAMIN D 25 HYDROXY (VIT D DEFICIENCY, FRACTURES): Vit D, 25-Hydroxy: 37.98 ng/mL (ref 30–100)

## 2021-07-30 MED ORDER — TAMSULOSIN HCL 0.4 MG PO CAPS
0.8000 mg | ORAL_CAPSULE | Freq: Every day | ORAL | Status: DC
  Administered 2021-07-30 – 2021-07-31 (×2): 0.8 mg via ORAL
  Filled 2021-07-30 (×2): qty 2

## 2021-07-30 MED ORDER — TAMSULOSIN HCL 0.4 MG PO CAPS: 0.8000 mg | ORAL_CAPSULE | Freq: Every day | ORAL | Status: DC

## 2021-07-30 MED ORDER — TRAZODONE HCL 50 MG PO TABS
50.0000 mg | ORAL_TABLET | Freq: Every day | ORAL | Status: DC
  Administered 2021-07-30 – 2021-08-04 (×6): 50 mg via ORAL
  Filled 2021-07-30 (×6): qty 1

## 2021-07-30 MED ORDER — PANTOPRAZOLE SODIUM 40 MG PO TBEC
40.0000 mg | DELAYED_RELEASE_TABLET | Freq: Every day | ORAL | Status: DC
  Administered 2021-07-31 – 2021-08-18 (×19): 40 mg via ORAL
  Filled 2021-07-30 (×19): qty 1

## 2021-07-30 NOTE — Plan of Care (Signed)
°  Problem: RH Balance Goal: LTG: Patient will maintain dynamic sitting balance (OT) Description: LTG:  Patient will maintain dynamic sitting balance with assistance during activities of daily living (OT) Flowsheets (Taken 07/30/2021 1328) LTG: Pt will maintain dynamic sitting balance during ADLs with: Independent with assistive device Goal: LTG Patient will maintain dynamic standing with ADLs (OT) Description: LTG:  Patient will maintain dynamic standing balance with assist during activities of daily living (OT)  Flowsheets (Taken 07/30/2021 1328) LTG: Pt will maintain dynamic standing balance during ADLs with: Supervision/Verbal cueing   Problem: Sit to Stand Goal: LTG:  Patient will perform sit to stand in prep for activites of daily living with assistance level (OT) Description: LTG:  Patient will perform sit to stand in prep for activites of daily living with assistance level (OT) Flowsheets (Taken 07/30/2021 1328) LTG: PT will perform sit to stand in prep for activites of daily living with assistance level: Supervision/Verbal cueing   Problem: RH Grooming Goal: LTG Patient will perform grooming w/assist,cues/equip (OT) Description: LTG: Patient will perform grooming with assist, with/without cues using equipment (OT) Flowsheets (Taken 07/30/2021 1328) LTG: Pt will perform grooming with assistance level of: Minimal Assistance - Patient > 75%   Problem: RH Bathing Goal: LTG Patient will bathe all body parts with assist levels (OT) Description: LTG: Patient will bathe all body parts with assist levels (OT) Flowsheets (Taken 07/30/2021 1328) LTG: Pt will perform bathing with assistance level/cueing: Minimal Assistance - Patient > 75%   Problem: RH Dressing Goal: LTG Patient will perform upper body dressing (OT) Description: LTG Patient will perform upper body dressing with assist, with/without cues (OT). Flowsheets (Taken 07/30/2021 1328) LTG: Pt will perform upper body dressing with  assistance level of: Minimal Assistance - Patient > 75%   Problem: RH Toileting Goal: LTG Patient will perform toileting task (3/3 steps) with assistance level (OT) Description: LTG: Patient will perform toileting task (3/3 steps) with assistance level (OT)  Flowsheets (Taken 07/30/2021 1328) LTG: Pt will perform toileting task (3/3 steps) with assistance level: Contact Guard/Touching assist   Problem: RH Functional Use of Upper Extremity Goal: LTG Patient will use RT/LT upper extremity as a (OT) Description: LTG: Patient will use right/left upper extremity as a stabilizer/gross assist/diminished/nondominant/dominant level with assist, with/without cues during functional activity (OT) Flowsheets (Taken 07/30/2021 1330) LTG: Use of upper extremity in functional activities: LUE as diminished level LTG: Pt will use upper extremity in functional activity with assistance level of: Supervision/Verbal cueing   Problem: RH Memory Goal: LTG Patient will demonstrate ability for day to day recall/carry over during activities of daily living with assistance level (OT) Description: LTG:  Patient will demonstrate ability for day to day recall/carry over during activities of daily living with assistance level (OT). Flowsheets (Taken 07/30/2021 1328) LTG:  Patient will demonstrate ability for day to day recall/carry over during activities of daily living with assistance level (OT): Moderate Assistance - Patient 50 - 74%   Problem: RH Attention Goal: LTG Patient will demonstrate this level of attention during functional activites (OT) Description: LTG:  Patient will demonstrate this level of attention during functional activites  (OT) Flowsheets (Taken 07/30/2021 1328) Patient will demonstrate this level of attention during functional activites: Selective LTG: Patient will demonstrate this level of attention during functional activites (OT): Moderate Assistance - Patient 50 - 74%

## 2021-07-30 NOTE — Progress Notes (Signed)
Inpatient [facility] Individual Statement of Services  Patient Name:  William [name]  Date:  07/30/2021  Welcome to the [facility].  Our goal is to provide you with an individualized program based on your diagnosis and situation, designed to meet your specific needs.  With this comprehensive rehabilitation program, you will be expected to participate in at least 3 hours of rehabilitation therapies Monday-Friday, with modified therapy programming on the weekends.  Your rehabilitation program will include the following services:  Physical Therapy (PT), Occupational Therapy (OT), Speech Therapy (ST), 24 hour per day rehabilitation nursing, Therapeutic Recreaction (TR), Care Coordinator, Rehabilitation Medicine, Nutrition Services, and Pharmacy Services  Weekly team conferences will be held on Wednesday to discuss your progress.  Your Inpatient Rehabilitation Care Coordinator will talk with you frequently to get your input and to update you on team discussions.  Team conferences with you and your family in attendance may also be held.  Expected length of stay: 14-18 days  Overall anticipated outcome: supervision-CGA min for bathing  Depending on your progress and recovery, your program may change. Your Inpatient Rehabilitation Care Coordinator will coordinate services and will keep you informed of any changes. Your Inpatient Rehabilitation Care Coordinator's name and contact numbers are listed  below.  The following services may also be recommended but are not provided by the [facility]:   [place] will be made to provide these services after discharge if needed.  Arrangements include referral to agencies that provide these services.  Your insurance has been verified to be:  [employer] Your primary doctor is:  Garnet Koyanagi  Pertinent information will be  shared with your doctor and your insurance company.  Inpatient Rehabilitation Care Coordinator:  Ovidio Kin, Simms or Emilia Beck  Information discussed with and copy given to patient by: Elease Hashimoto, 07/30/2021, 1:31 PM

## 2021-07-30 NOTE — Progress Notes (Signed)
Inpatient Rehabilitation  Patient information reviewed and entered into eRehab system by Camarie Mctigue M. Sema Stangler, M.A., CCC/SLP, PPS Coordinator.  Information including medical coding, functional ability and quality indicators will be reviewed and updated through discharge.    

## 2021-07-30 NOTE — Progress Notes (Signed)
Inpatient Rehabilitation Care Coordinator Assessment and Plan Patient Details  Name: William [name] MRN: [id number] Date of Birth: 1941-05-10  Today's Date: 07/30/2021  Hospital Problems: Principal Problem:   Basal ganglia infarction [facility])  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   Mild, related to stroke history   Nocturia    Renal insufficiency 06/25/2013   S/P carotid endarterectomy    BILATERAL ICA--  PATENT PER DUPLEX  05-19-2012   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   Right calf   Urinary frequency    Vitamin D deficiency    Past Surgical History:  Past Surgical History:  Procedure Laterality Date   APPENDECTOMY  AS CHILD   CARDIOVASCULAR STRESS TEST  03-27-2012  DR CRENSHAW   LOW RISK LEXISCAN STUDY-- PROBABLE NORMAL PERFUSION AND SOFT TISSUE ATTENUATION/  NO ISCHEMIA/ EF 51%   CAROTID ENDARTERECTOMY Bilateral LEFT  11-12-2008  DR GREG HAYES   RIGHT ICA  2006  ([facility])   CYSTOSCOPY W/ RETROGRADES Bilateral 06/22/2021   Procedure: CYSTOSCOPY WITH RETROGRADE PYELOGRAM;  Surgeon: Franchot Gallo, MD;  Location: [facility];  Service: Urology;  Laterality: Bilateral;   CYSTOSCOPY WITH LITHOLAPAXY N/A 02/26/2013   Procedure: CYSTOSCOPY WITH LITHOLAPAXY;  Surgeon: Franchot Gallo, MD;  Location: [facility];  Service: Urology;  Laterality: N/A;   EYE SURGERY  Jan. 2016   cataract surgery both eyes   INGUINAL HERNIA REPAIR Right 11-08-2006   IR KYPHO EA ADDL LEVEL THORACIC OR LUMBAR  02/12/2021   IR RADIOLOGIST EVAL & MGMT  02/18/2021   MASS EXCISION N/A 03/03/2016   Procedure: EXCISION OF BACK  MASS;  Surgeon: Stark Klein, MD;  Location: [place];  Service: General;  Laterality: N/A;   MOHS SURGERY Left 1/ 2016   Dr Nevada Crane-- Basal cell   PROSTATE SURGERY     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-C N/A 06/22/2021   Procedure: TRANSURETHRAL RESECTION OF BLADDER TUMOR;  Surgeon: Franchot Gallo, MD;  Location: [facility];  Service: Urology;  Laterality: N/A;   TRANSURETHRAL RESECTION OF PROSTATE N/A 02/26/2013   Procedure: TRANSURETHRAL RESECTION OF THE PROSTATE WITH GYRUS INSTRUMENTS;  Surgeon: Franchot Gallo, MD;  Location: [facility];  Service: Urology;  Laterality: N/A;   TRANSURETHRAL RESECTION OF PROSTATE N/A 06/22/2021   Procedure: TRANSURETHRAL RESECTION OF THE PROSTATE (TURP);  Surgeon: Franchot Gallo, MD;  Location: [facility];  Service: Urology;  Laterality: N/A;   Social History:  reports that he quit smoking about 16 years ago. His smoking use included cigarettes. He has a 80.00 pack-year smoking history. He has never used smokeless tobacco. He reports current alcohol use. He reports that he does not use drugs.  Family / [place]  Marital Status: Married Patient Roles: Spouse, Parent Spouse/Significant Other: [name] [phone]  [phone] Children: Son here from [facility] for a short time-Rob [phone]  [place]-daughter [phone] Other Supports: Friends Anticipated Caregiver: [name] Ability/Limitations of Caregiver: Wife works but is looking into caregivers and the enhanced program from their insurance Caregiver Availability: Other (Comment) (Aware pt will need 24/7 care) Family  Dynamics: Close knit with children but both are in [facility] and wife is very supportive and willing to assist.  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 issues Guardian/Conservator: None-according to MD pt is not capable of making his own decisions while here will look toward his wife since no formal POA/HCPOA in place   Abuse/Neglect Abuse/Neglect Assessment Can Be Completed: Yes Physical Abuse: Denies Verbal Abuse: Denies Sexual Abuse: Denies Exploitation of patient/patient's resources: Denies Self-Neglect: Denies  Patient response to: Social Isolation - How often do you feel lonely or isolated from those around you?: Rarely  Emotional Status Pt's affect, behavior and adjustment status: Pt is not able to answer this worker's questions-obtained information from wife and son who are present. Pt recovered from last stroke and was doing well was still going to OPPT. Pt will need to clear to be able to be safe to stay alone at home. He was using a walker at home PTA Recent Psychosocial Issues: past CVA 2006 has residual weakness from this and fractured ribs along with new CVA Psychiatric History: No history only oriented to self and place if that, he is not appropriate for neuro-psych at this time. Will continue to re-evaluate pt while here Substance Abuse History: No issues  Patient / Family Perceptions, Expectations & Goals Pt/Family understanding of illness & functional limitations: Wife and son can explain pt's stroke and issues, both talk with the MD and feel they have a good understanding of his treatment plan moving forward. Both want to be kept updated  regarding his medical issues Premorbid pt/family roles/activities: Husband, father, retiree, veteran, grandfather, church member, etc Anticipated changes in roles/activities/participation: resume Pt/family expectations/goals: Wife states: " I hope he does well here, as much as he can do will help me."  Son states: " We will need to come up with plan at discharge."  [employer]: None Premorbid Home Care/DME Agencies: Other (Comment) (OPPT at [employer]) Transportation available at discharge: wife Is the patient able to respond to transportation needs?: Yes In the past 12 months, has lack of transportation kept you from medical appointments or from getting medications?: No In the past 12 months, has lack of transportation kept you from meetings, work, or from getting things needed for daily living?: No  Discharge Planning Living Arrangements: Spouse/significant other Support Systems: Spouse/significant other, Children, Friends/neighbors Type of Residence: Private residence Insurance Resources: Multimedia programmer (specify) ([employer]) Financial Resources: Social Security, Employment Financial Screen Referred: No Living Expenses: Lives with family Money Management: Spouse Does the patient have any problems obtaining your medications?: No Home Management: Wife Patient/Family Preliminary Plans: Return home with wife who does work but will try to adjust her schedule. Made aware he will need 24/7 care due to cognitive issues for safety. Hopefully pt will clear and be more oriented as he recovers. Care Coordinator Barriers to Discharge:  Decreased caregiver support, Insurance for SNF coverage Care Coordinator Anticipated Follow Up Needs: HH/OP  Clinical Impression Pleasantly confused gentleman who lets his wife and son answer for him. Wife is is primary caregiver. His son and daughter live out of state but will try to help out. Will await team evaluation and  work on discharge needs. Have given wife private duty list and information how to apply for Medicaid on-line  Amiylah Anastos, Gardiner Rhyme 07/30/2021, 1:28 PM

## 2021-07-30 NOTE — Evaluation (Signed)
Speech Language Pathology Assessment and Plan  Patient Details  Name: William [name] MRN: [id number] Date of Birth: Jan 31, 1941  SLP Diagnosis: Aphasia;Cognitive Impairments  Rehab Potential: Good ELOS: 2 weeks    Today's Date: 07/30/2021 SLP Individual Time: 5686-1683 SLP Individual Time Calculation (min): 4 min   Hospital Problem: Principal Problem:   Basal ganglia infarction [facility])  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   Mild, related to stroke history   Nocturia    Renal insufficiency 06/25/2013   S/P carotid endarterectomy    BILATERAL ICA--  PATENT PER DUPLEX  05-19-2012   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   Right calf   Urinary frequency    Vitamin D deficiency    Past Surgical History:  Past Surgical History:  Procedure Laterality Date   APPENDECTOMY  AS CHILD   CARDIOVASCULAR STRESS TEST  03-27-2012  DR CRENSHAW   LOW RISK LEXISCAN STUDY-- PROBABLE NORMAL PERFUSION AND SOFT TISSUE ATTENUATION/  NO ISCHEMIA/ EF 51%   CAROTID ENDARTERECTOMY Bilateral LEFT  11-12-2008  DR GREG HAYES   RIGHT ICA  2006  ([facility])   CYSTOSCOPY W/ RETROGRADES Bilateral 06/22/2021   Procedure: CYSTOSCOPY WITH RETROGRADE PYELOGRAM;  Surgeon: Franchot Gallo, MD;  Location: [facility];  Service: Urology;  Laterality: Bilateral;    CYSTOSCOPY WITH LITHOLAPAXY N/A 02/26/2013   Procedure: CYSTOSCOPY WITH LITHOLAPAXY;  Surgeon: Franchot Gallo, MD;  Location: [facility];  Service: Urology;  Laterality: N/A;   EYE SURGERY  Jan. 2016   cataract surgery both eyes   INGUINAL HERNIA REPAIR Right 11-08-2006   IR KYPHO EA ADDL LEVEL THORACIC OR LUMBAR  02/12/2021   IR RADIOLOGIST EVAL & MGMT  02/18/2021   MASS EXCISION N/A 03/03/2016   Procedure: EXCISION OF BACK  MASS;  Surgeon: Stark Klein, MD;  Location: [place];  Service: General;  Laterality: N/A;   MOHS SURGERY Left 1/ 2016   Dr Nevada Crane-- Basal cell   PROSTATE SURGERY     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-C N/A 06/22/2021   Procedure: TRANSURETHRAL RESECTION OF BLADDER TUMOR;  Surgeon: Franchot Gallo, MD;  Location: [facility];  Service: Urology;  Laterality: N/A;   TRANSURETHRAL RESECTION OF PROSTATE N/A 02/26/2013   Procedure: TRANSURETHRAL RESECTION OF THE PROSTATE WITH GYRUS INSTRUMENTS;  Surgeon: Franchot Gallo, MD;  Location: [facility];  Service: Urology;  Laterality: N/A;   TRANSURETHRAL RESECTION OF PROSTATE N/A 06/22/2021   Procedure: TRANSURETHRAL RESECTION OF THE PROSTATE (TURP);  Surgeon: Franchot Gallo, MD;  Location: [facility];  Service: Urology;  Laterality: N/A;    Assessment / Plan / Recommendation Clinical Impression Patient is a 81 year old male with a history of previous right MCA infarction with  mild neurocognitive disorder who initially presented to [facility] emergency department on 2/8 for altered mental status.  MRI and CT were negative.  Since was concern for UTI and he was started on cefdinir and discharged home.  The following day on 2/9, he presented to [facility] with worsening confusion and slurred speech, as well as vomiting of coffee-ground type emesis.  His wife found him on the bathroom floor and he was lethargic  and not responding as usual.  CT of the head and cervical spine was significant for an acute perforator infarct at the left basal ganglia and corona radiata.   The scan of the abdomen and pelvis performed secondary to upper abdominal pain and tenderness.  Findings included thickened gallbladder wall, 11th and 12th rib fractures and T12-L3 lateral transverse processes fractures.  Ultrasound of the right upper quadrant revealed gallstones.  Tolerating heart healthy diet with thin liquids.  CIR recommended and patient admitted 07/29/21.  Patient demonstrates a moderate aphasia with receptive abilities appearing mor intact than expressive. Patient answered complex yes/no questions with 80% accuracy and followed mult-step commands with 60% accuracy. Patient's responsive and confrontational naming appeared intact but patient demonstrated difficulty with divergent naming tasks. Patient with intermittent phonemic paraphasias with increased word-finding deficits noted during informal conversation. Sentence completion and phonemic cues were effective in improving word-finding. Patient's speech intelligibility was severely impacted by low vocal intensity with Max A multimodal cues needed for use of an increased vocal intensity at the word and phrase level.  Patient has a history of baseline cognitive deficits with mild deficits in orientation and attention observed throughout the evaluation. Patient would benefit from skilled SLP intervention to maximize his cognitive-linguistic functioning and overall functional communication prior to discharge.      Skilled Therapeutic Interventions          Administered a cognitive-linguistic evaluation, please see above for details.  Patient's son, daughter and wife present. Provided education regarding prognosis, goals of SLP intervention and strategies to assist with word-finding. All verbalized understanding of all information.    SLP Assessment  Patient will need skilled [facility] during CIR admission    Recommendations  Oral Care Recommendations: Oral care BID Patient destination: Home Follow up Recommendations: 24 hour supervision/assistance;Home Health SLP Equipment Recommended: None recommended by SLP    SLP Frequency 3 to 5 out of 7 days   SLP Duration  SLP Intensity  SLP Treatment/Interventions 2 weeks  Minumum of 1-2 x/day, 30 to 90 minutes  Cognitive remediation/compensation;Internal/external aids;Speech/Language facilitation;Therapeutic Activities;Environmental controls;Cueing hierarchy;Functional tasks;Patient/family education    Pain Pain Assessment Pain Scale: 0-10 Pain Score: 8  Pain Location: Rib cage Pain Orientation: Left  Prior Functioning Type of Home: House  Lives With: Spouse Available Help at Discharge: Family Vocation: Retired  SLP Evaluation Cognition Overall Cognitive Status: History of cognitive impairments - at baseline Arousal/Alertness: Lethargic Orientation Level: Oriented to place;Oriented to person;Oriented to situation Year: 2023 Month: January Day of Week: Incorrect (Friday) Attention: Sustained Sustained Attention: Impaired Sustained Attention Impairment: Verbal basic;Functional basic Memory: Impaired Memory Impairment: Decreased recall of new information;Decreased short term memory Decreased Short Term Memory: Functional basic;Verbal basic Immediate Memory Recall: Sock;Blue;Bed Memory Recall Sock: Not able to recall Memory Recall Blue: Not able to recall Memory Recall Bed: Not able to recall Awareness: Impaired Awareness Impairment: Intellectual impairment Problem Solving: Impaired Problem Solving Impairment: Functional basic;Verbal basic Executive Function: Decision Making;Sequencing Sequencing: Impaired Sequencing Impairment: Verbal basic;Functional basic Decision Making: Impaired Decision  Making Impairment: Functional basic Behaviors: Restless Safety/Judgment:  Impaired  Comprehension Auditory Comprehension Overall Auditory Comprehension: Impaired Yes/No Questions: Impaired Basic Biographical Questions: 76-100% accurate Basic Immediate Environment Questions: 75-100% accurate Complex Questions: 75-100% accurate Commands: Impaired One Step Basic Commands: 75-100% accurate Two Step Basic Commands: 50-74% accurate Conversation: Simple Interfering Components: Attention;Processing speed;Working Curator: Not tested Reading Comprehension Reading Status: Not tested Expression Expression Primary Mode of Expression: Verbal Verbal Expression Overall Verbal Expression: Impaired Initiation: No impairment Automatic Speech: Day of week;Month of year Level of Generative/Spontaneous Verbalization: Sentence Repetition: No impairment Naming: Impairment Responsive: 76-100% accurate Confrontation: Within functional limits Convergent: 50-74% accurate Divergent: 0-24% accurate Verbal Errors: Phonemic paraphasias;Perseveration Pragmatics: Impairment Impairments: Abnormal affect;Turn Taking Interfering Components: Attention;Speech intelligibility Written Expression Written Expression: Not tested Oral Motor Oral Motor/Sensory Function Overall Oral Motor/Sensory Function: Mild impairment Facial Symmetry: Abnormal symmetry left;Suspected CN VII (facial) dysfunction Motor Speech Overall Motor Speech: Impaired Respiration: Within functional limits Phonation: Low vocal intensity Resonance: Within functional limits Articulation: Within functional limitis Intelligibility: Intelligibility reduced Word: 75-100% accurate Phrase: 50-74% accurate Sentence: 50-74% accurate Motor Planning: Witnin functional limits Effective Techniques: Increased vocal intensity  Care Tool Care Tool Cognition Ability to hear (with hearing aid or hearing appliances if normally used Ability to hear (with hearing aid or hearing  appliances if normally used): 0. Adequate - no difficulty in normal conservation, social interaction, listening to TV   Expression of Ideas and Wants Expression of Ideas and Wants: 2. Frequent difficulty - frequently exhibits difficulty with expressing needs and ideas   Understanding Verbal and Non-Verbal Content Understanding Verbal and Non-Verbal Content: 3. Usually understands - understands most conversations, but misses some part/intent of message. Requires cues at times to understand  Memory/Recall Ability Memory/Recall Ability : That he or she is in a hospital/hospital unit    Short Term Goals: Week 1: SLP Short Term Goal 1 (Week 1): Patient will demonstrate sustained attention to functional tasks for 20 mintues with Mod verbal cues for redirection. SLP Short Term Goal 2 (Week 1): Patient will utilize external aids for orientation to place, time and situation with Mod verbal cues. SLP Short Term Goal 3 (Week 1): Patient will utilize an increaesd vocal intensity at the word level to achieve ~75% intelligibility with Max A multimodal cues. SLP Short Term Goal 4 (Week 1): Patient will demonstrate improved word-finding at the phrase level with Mod A multitmodal cues. SLP Short Term Goal 5 (Week 1): Patient will self-monitor and correct errors during verbal expression with Mod A multimodal cues.  Refer to Care Plan for Long Term Goals  Recommendations for other services: None   Discharge Criteria: Patient will be discharged from SLP if patient refuses treatment 3 consecutive times without medical reason, if treatment goals not met, if there is a change in medical status, if patient makes no progress towards goals or if patient is discharged from hospital.  The above assessment, treatment plan, treatment alternatives and goals were discussed and mutually agreed upon: by patient and by family  Evone Arseneau 07/30/2021, 3:33 PM

## 2021-07-30 NOTE — Plan of Care (Signed)
°  Problem: Sit to Stand Goal: LTG:  Patient will perform sit to stand with assistance level (PT) Description: LTG:  Patient will perform sit to stand with assistance level (PT) Flowsheets (Taken 07/30/2021 1414) LTG: PT will perform sit to stand in preparation for functional mobility with assistance level: Supervision/Verbal cueing   Problem: RH Balance Goal: LTG Patient will maintain dynamic sitting balance (PT) Description: LTG:  Patient will maintain dynamic sitting balance with assistance during mobility activities (PT) Flowsheets (Taken 07/30/2021 1414) LTG: Pt will maintain dynamic sitting balance during mobility activities with:: Supervision/Verbal cueing Goal: LTG Patient will maintain dynamic standing balance (PT) Description: LTG:  Patient will maintain dynamic standing balance with assistance during mobility activities (PT) Flowsheets (Taken 07/30/2021 1414) LTG: Pt will maintain dynamic standing balance during mobility activities with:: Supervision/Verbal cueing   Problem: Sit to Stand Goal: LTG:  Patient will perform sit to stand with assistance level (PT) Description: LTG:  Patient will perform sit to stand with assistance level (PT) Flowsheets (Taken 07/30/2021 1414) LTG: PT will perform sit to stand in preparation for functional mobility with assistance level: Supervision/Verbal cueing   Problem: RH Bed Mobility Goal: LTG Patient will perform bed mobility with assist (PT) Description: LTG: Patient will perform bed mobility with assistance, with/without cues (PT). Flowsheets (Taken 07/30/2021 1414) LTG: Pt will perform bed mobility with assistance level of: Supervision/Verbal cueing   Problem: RH Bed to Chair Transfers Goal: LTG Patient will perform bed/chair transfers w/assist (PT) Description: LTG: Patient will perform bed to chair transfers with assistance (PT). Flowsheets (Taken 07/30/2021 1414) LTG: Pt will perform Bed to Chair Transfers with assistance level:  Supervision/Verbal cueing   Problem: RH Car Transfers Goal: LTG Patient will perform car transfers with assist (PT) Description: LTG: Patient will perform car transfers with assistance (PT). Flowsheets (Taken 07/30/2021 1414) LTG: Pt will perform car transfers with assist:: Contact Guard/Touching assist   Problem: RH Ambulation Goal: LTG Patient will ambulate in controlled environment (PT) Description: LTG: Patient will ambulate in a controlled environment, # of feet with assistance (PT). Flowsheets (Taken 07/30/2021 1414) LTG: Pt will ambulate in controlled environ  assist needed:: Contact Guard/Touching assist LTG: Ambulation distance in controlled environment: 150 Goal: LTG Patient will ambulate in home environment (PT) Description: LTG: Patient will ambulate in home environment, # of feet with assistance (PT). Flowsheets (Taken 07/30/2021 1414) LTG: Pt will ambulate in home environ  assist needed:: Supervision/Verbal cueing LTG: Ambulation distance in home environment: 30   Problem: RH Stairs Goal: LTG Patient will ambulate up and down stairs w/assist (PT) Description: LTG: Patient will ambulate up and down # of stairs with assistance (PT) Flowsheets (Taken 07/30/2021 1414) LTG: Pt will ambulate up/down stairs assist needed:: Contact Guard/Touching assist LTG: Pt will  ambulate up and down number of stairs: 1

## 2021-07-30 NOTE — Plan of Care (Signed)
°  Problem: RH Expression Communication Goal: LTG Patient will verbally express basic/complex needs(SLP) Description: LTG:  Patient will verbally express basic/complex needs, wants or ideas with cues  (SLP) Flowsheets (Taken 07/30/2021 1524) LTG: Patient will verbally express basic/complex needs, wants or ideas (SLP): Minimal Assistance - Patient > 75% Goal: LTG Patient will increase speech intelligibility (SLP) Description: LTG: Patient will increase speech intelligibility at word/phrase/conversation level with cues, % of the time (SLP) Flowsheets (Taken 07/30/2021 1524) LTG: Patient will increase speech intelligibility (SLP): Minimal Assistance - Patient > 75% Goal: LTG Patient will increase word finding of common (SLP) Description: LTG:  Patient will increase word finding of common objects/daily info/abstract thoughts with cues using compensatory strategies (SLP). Flowsheets (Taken 07/30/2021 1524) LTG: Patient will increase word finding of common (SLP): Minimal Assistance - Patient > 75% Patient will use compensatory strategies to increase word finding of: Daily info   Problem: RH Attention Goal: LTG Patient will demonstrate this level of attention during functional activites (SLP) Description: LTG:  Patient will will demonstrate this level of attention during functional activites (SLP) Flowsheets (Taken 07/30/2021 1524) Patient will demonstrate during cognitive/linguistic activities the attention type of: Sustained Patient will demonstrate this level of attention during cognitive/linguistic activities in: Controlled LTG: Patient will demonstrate this level of attention during cognitive/linguistic activities with assistance of (SLP): Minimal Assistance - Patient > 75% Number of minutes patient will demonstrate attention during cognitive/linguistic activities: 30

## 2021-07-30 NOTE — Progress Notes (Signed)
PROGRESS NOTE   Subjective/Complaints: +insomnia. Was on ambien at home. Discussed risks and benefits of Ambien vs. Trazodone and wife prefers to try Trazodone tonight.  Wife reports he has been trying to get out of bed on his own  ROS: +insomnia  Objective:   CT ABDOMEN PELVIS WO CONTRAST  Result Date: 07/29/2021 CLINICAL DATA:  Abdominal pain EXAM: CT ABDOMEN AND PELVIS WITHOUT CONTRAST TECHNIQUE: Multidetector CT imaging of the abdomen and pelvis was performed following the standard protocol without IV contrast. RADIATION DOSE REDUCTION: This exam was performed according to the departmental dose-optimization program which includes automated exposure control, adjustment of the mA and/or kV according to patient size and/or use of iterative reconstruction technique. COMPARISON:  CT abdomen/pelvis 04/28/2021, right upper quadrant ultrasound 07/27/2021 FINDINGS: Lower chest: There are patchy opacities in the lung bases. Is mild calcification of the aortic valve. Coronary artery calcification is noted in the left anterior descending artery. Hepatobiliary: The liver is unremarkable, within the confines of noncontrast technique. There is hyperdensity within gallbladder likely reflecting a combination of stones and sludge. There is no appreciable gallbladder wall thickening and no significant pericholecystic fluid or inflammatory change. Pancreas: Unremarkable. Spleen: Unremarkable. Adrenals/Urinary Tract: The adrenals are unremarkable. Multiple right renal cysts are noted, the largest measuring up to 5.2 cm. No other focal lesions are seen, within the confines of noncontrast technique. No stones are seen. There is no hydronephrosis or hydroureter. The bladder is unremarkable. Stomach/Bowel: The stomach is unremarkable, allowing for underdistention. There is no evidence of bowel obstruction. There is no abnormal bowel wall thickening or inflammatory  change. There is colonic diverticulosis without evidence of acute diverticulitis. The appendix is not definitively identified, but there is no pericecal inflammatory change. Vascular/Lymphatic: Is extensive calcified atherosclerotic plaque throughout the nonaneurysmal abdominal aorta. There is no abdominal or pelvic lymphadenopathy. 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 The prostate is mildly enlarged, unchanged. The seminal vesicles are unremarkable. Other: There is no ascites or free air. Musculoskeletal: There is a nondisplaced fracture of the left eleventh and twelfth ribs at the costochondral junctions (3-24, 3-30). There is an additional fracture of the eleventh rib laterally (3-38). There are nondisplaced fractures of the left transverse processes at T12 and L1 (6-133, 6-124). These are new since the prior study from 04/28/2021 and may be related to the recent fall. Vertebral body heights are preserved. Post kyphoplasty changes are seen at L1. There are flowing anterior osteophytes in the imaged lower thoracic spine consistent with diffuse idiopathic skeletal hyperostosis. IMPRESSION: 1. Nondisplaced fractures of the left eleventh and twelfth ribs (the twelfth rib is broken in two places), and left transverse processes at T12 through L3, new since the prior study from November 2022 and likely related to the recent fall. 2. Otherwise, no acute findings in the abdomen or pelvis. 3. Cholelithiasis and probable sludge without evidence of acute cholecystitis. 4. Diverticulosis without evidence of acute diverticulitis. 5. Patchy opacities in the lung bases may reflect atelectasis, though infection can not be excluded. Aortic Atherosclerosis (ICD10-I70.0). Electronically Signed   By: Valetta Mole M.D.   On: 07/29/2021 13:32   Recent Labs    07/29/21 0865 07/30/21  0606  WBC 10.0 8.8  HGB 8.9* 9.1*  HCT 26.1* 27.7*  PLT 168 184   Recent Labs    07/29/21 0339 07/30/21 0606  NA 139 141  K  3.8 3.8  CL 112* 109  CO2 21* 23  GLUCOSE 106* 121*  BUN 26* 23  CREATININE 2.02* 2.05*  CALCIUM 8.4* 8.9    Intake/Output Summary (Last 24 hours) at 07/30/2021 1053 Last data filed at 07/29/2021 1951 Gross per 24 hour  Intake 240 ml  Output 100 ml  Net 140 ml        Physical Exam: Vital Signs Blood pressure (!) 178/74, pulse 78, temperature 98.2 F (36.8 C), temperature source Oral, resp. rate 16, height 5\' 11"  (1.803 m), weight 95.2 kg, SpO2 95 %. Gen: no distress, normal appearing HEENT: oral mucosa pink and moist, NCAT Cardio: Reg rate Chest: normal effort, normal rate of breathing Abd: soft, non-distended Ext: no edema Psych: pleasant, normal affect Skin: intact Neuro: Alert and oriented x3. RUE 4/5, LUE 2/5 proximally and 4/5 distally, bilateral lower extremities 3/5.  Able to tolerate standing.      Assessment/Plan: 1. Functional deficits which require 3+ hours per day of interdisciplinary therapy in a comprehensive inpatient rehab setting. Physiatrist is providing close team supervision and 24 hour management of active medical problems listed below. Physiatrist and rehab team continue to assess barriers to discharge/monitor patient progress toward functional and medical goals  Care Tool:  Bathing              Bathing assist       Upper Body Dressing/Undressing Upper body dressing        Upper body assist      Lower Body Dressing/Undressing Lower body dressing            Lower body assist       Toileting Toileting    Toileting assist Assist for toileting: Moderate Assistance - Patient 50 - 74% (helped pt with urinal.)     Transfers Chair/bed transfer  Transfers assist           Locomotion Ambulation   Ambulation assist              Walk 10 feet activity   Assist           Walk 50 feet activity   Assist           Walk 150 feet activity   Assist           Walk 10 feet on uneven surface   activity   Assist           Wheelchair     Assist               Wheelchair 50 feet with 2 turns activity    Assist            Wheelchair 150 feet activity     Assist          Blood pressure (!) 178/74, pulse 78, temperature 98.2 F (36.8 C), temperature source Oral, resp. rate 16, height 5\' 11"  (1.803 m), weight 95.2 kg, SpO2 95 %.  Medical Problem List and Plan: 1. 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 Discussed estimate of 2-3 week stay with wife HFU scheduled.  2.  Antithrombotics: -DVT/anticoagulation:  Mechanical: Sequential compression devices, below knee Bilateral lower extremities; ? start Lovenox             -antiplatelet therapy: aspirin 3. Rib fracture pain: Tylenol, tramadol as needed. Start lidocaine patch.  4. Depression: Start trazodone 50mg  [facility] LCSW asked to evaluate and provide emotional support             --continue carbamazepine              -antipsychotic agents: Not applicable 5. Neuropsych: This patient is not capable of making decisions on his own behalf. 6. Skin/Wound Care: Routine skin care checks 7. 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 10: History of bladder cancer/TURP             -- Bladder scan, check PVRs 11: Rib fractures: Pain control, pulmonary toilet 12: T12-L3 transverse process fractures: Stable, pain control 13: Insomnia/agitation (?): takes Ativan BID at home as this is continued 14:Gallstones without acute cholecystitis: Currently asymptomatic 15: Chronic kidney disease stage IV:  --baseline serum creatinine 2.2-2.4 --Monitor urine output  and chemistries 16: Transaminitis: Monitor chemistries 17: Hypertension: Continue Norvasc and hydralazine             -- Home Zestoretic held secondary to kidney function 18: Hyperlipidemia: Continue fenofibrate 19: Bilateral carotid artery stenosis: Asymptomatic             -- Status post bilateral carotid endarterectomies 20: Anemia, chronic disease, hemodilution etiologies likely: Monitor CBC             -- Fecal occult blood negative 2/13             -- Colonoscopy 2016, polyps.  Recall 5 years 21. Prediabetes: check CBGs AC/HS.  22. Hypokalemia: check potassium tomorrow morning.  23. Screening for Vitamin D deficiency: check vitamin D level tomorrow morning.  24. Insomnia: start trazodone 50mg  [facility]  LOS: 1 days A FACE TO FACE EVALUATION WAS PERFORMED  Clide Deutscher Adalind Weitz 07/30/2021, 10:53 AM

## 2021-07-30 NOTE — Evaluation (Signed)
Physical Therapy Assessment and Plan  Patient Details  Name: William [name] MRN: [id number] Date of Birth: 1941/04/17  PT Diagnosis: Abnormal posture, Abnormality of gait, Cognitive deficits, and Muscle weakness Rehab Potential: Fair ELOS: 2-2.5 weeks   Today's Date: 07/30/2021 PT Individual Time: 4098-1191 PT Individual Time Calculation (min): 48 min    Hospital Problem: Principal Problem:   Basal ganglia infarction [facility])   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   Mild, related to stroke history   Nocturia    Renal insufficiency 06/25/2013   S/P carotid endarterectomy    BILATERAL ICA--  PATENT PER DUPLEX  05-19-2012   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   Right calf   Urinary frequency    Vitamin D deficiency    Past Surgical History:  Past Surgical History:  Procedure Laterality Date   APPENDECTOMY  AS CHILD   CARDIOVASCULAR STRESS TEST  03-27-2012  DR CRENSHAW   LOW RISK LEXISCAN STUDY-- PROBABLE NORMAL PERFUSION AND SOFT TISSUE ATTENUATION/  NO ISCHEMIA/ EF 51%   CAROTID ENDARTERECTOMY Bilateral LEFT  11-12-2008  DR GREG HAYES   RIGHT ICA  2006  ([facility])   CYSTOSCOPY W/ RETROGRADES Bilateral 06/22/2021   Procedure: CYSTOSCOPY WITH RETROGRADE PYELOGRAM;  Surgeon: Franchot Gallo, MD;  Location: [facility];   Service: Urology;  Laterality: Bilateral;   CYSTOSCOPY WITH LITHOLAPAXY N/A 02/26/2013   Procedure: CYSTOSCOPY WITH LITHOLAPAXY;  Surgeon: Franchot Gallo, MD;  Location: [facility];  Service: Urology;  Laterality: N/A;   EYE SURGERY  Jan. 2016   cataract surgery both eyes   INGUINAL HERNIA REPAIR Right 11-08-2006   IR KYPHO EA ADDL LEVEL THORACIC OR LUMBAR  02/12/2021   IR RADIOLOGIST EVAL & MGMT  02/18/2021   MASS EXCISION N/A 03/03/2016   Procedure: EXCISION OF BACK  MASS;  Surgeon: Stark Klein, MD;  Location: [place];  Service: General;  Laterality: N/A;   MOHS SURGERY Left 1/ 2016   Dr Nevada Crane-- Basal cell   PROSTATE SURGERY     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-C N/A 06/22/2021   Procedure: TRANSURETHRAL RESECTION OF BLADDER TUMOR;  Surgeon: Franchot Gallo, MD;  Location: [facility];  Service: Urology;  Laterality: N/A;   TRANSURETHRAL RESECTION OF PROSTATE N/A 02/26/2013   Procedure: TRANSURETHRAL RESECTION OF THE PROSTATE WITH GYRUS INSTRUMENTS;  Surgeon: Franchot Gallo, MD;  Location[facility];  Service: Urology;  Laterality: N/A;   TRANSURETHRAL RESECTION OF PROSTATE N/A 06/22/2021   Procedure: TRANSURETHRAL RESECTION OF THE PROSTATE (TURP);  Surgeon: Franchot Gallo, MD;  Location: [facility];  Service: Urology;  Laterality: N/A;    Assessment & Plan Clinical Impression: Patient is a 81 y.o. year old male with  a history of previous left MCA infarction with mild neurocognitive disorder who initially presented to [facility] emergency department on 2/8 for altered mental status.  MRI and CT were negative.  Since was concern for UTI and he was started on cefdinir and discharged home.  The following day on 2/9, he presented to [facility] with worsening confusion and slurred speech, as well as vomiting of coffee-ground type emesis.  His wife found him on the bathroom  floor and he was lethargic and not responding as usual.  CT of the head and cervical spine was significant for an acute perforator infarct at the left basal ganglia and corona radiata.  Neurology consultation was obtained.  Large remote right MCA infarction involving most of that territory was again noted.  The scan of the abdomen and pelvis performed secondary to upper abdominal pain and tenderness.  Findings included thickened gallbladder wall, 11th and 12th rib fractures and T12-L3 lateral transverse processes fractures.  Ultrasound of the right upper quadrant revealed gallstones.  General surgery consulted, HIDA scan performed negative for acute cholecystitis.  Tolerating heart healthy diet with thin liquids.  The patient is observed to have residual left-sided weakness from previous stroke as well as overall generalized weakness.  The patient requires inpatient medicine and rehabilitation evaluations and services for ongoing dysfunction secondary to basal ganglia infarct.  Patient currently requires mod with mobility secondary to muscle weakness, decreased cardiorespiratoy endurance, decreased attention to left and decreased motor planning, decreased initiation, decreased attention, decreased awareness, decreased problem solving, decreased safety awareness, decreased memory, and delayed processing, and decreased sitting balance, decreased standing balance, decreased postural control, hemiplegia, and decreased balance strategies.  Prior to hospitalization, patient was min with mobility and lived with Spouse in a House home.  Home access is 1Stairs to enter.  Patient will benefit from skilled PT intervention to maximize safe functional mobility, minimize fall risk, and decrease caregiver burden for planned discharge home with 24 hour assist.  Anticipate patient will benefit from follow up [facility] at discharge.  PT - End of Session Activity Tolerance: Tolerates 30+ min activity with multiple rests Endurance  Deficit: Yes PT Assessment Rehab Potential (ACUTE/IP ONLY): Fair PT Barriers to Discharge: Decreased caregiver support;Insurance for SNF coverage PT Barriers to Discharge Comments: h/o previous CVA PT Patient demonstrates impairments in the following area(s): Balance;Behavior;Endurance;Motor;Nutrition;Pain;Perception;Safety;Sensory;Skin Integrity PT Transfers Functional Problem(s): Bed Mobility;Bed to Chair;Car;Furniture PT Locomotion Functional Problem(s): Ambulation;Wheelchair Mobility;Stairs PT Plan PT Intensity: Minimum of 1-2 x/day ,45 to 90 minutes PT Frequency: 5 out of 7 days PT Duration Estimated Length of Stay: 2-2.5 weeks PT Treatment/Interventions: Ambulation/gait training;DME/adaptive equipment instruction;Psychosocial support;UE/LE Strength taining/ROM;Balance/vestibular training;Functional electrical stimulation;Skin care/wound management;UE/LE Coordination activities;Cognitive remediation/compensation;Functional mobility training;Splinting/orthotics;Visual/perceptual remediation/compensation;Community reintegration;Neuromuscular re-education;Stair training;Wheelchair propulsion/positioning;Discharge planning;Pain management;Therapeutic Activities;Disease management/prevention;Patient/family education;Therapeutic Exercise PT Transfers Anticipated Outcome(s): CGA PT Locomotion Anticipated Outcome(s): CGA PT Recommendation Recommendations for Other Services: Therapeutic Recreation consult Therapeutic Recreation Interventions: Stress management Follow Up Recommendations: Home health PT;24 hour supervision/assistance Patient destination: Home Equipment Recommended: To be determined   PT Evaluation Precautions/Restrictions Precautions Precautions: Fall Precaution Comments: L 11th and 12th rib fxs Restrictions Weight Bearing Restrictions: No General   Vital Signs Pain Pain Assessment Pain Scale: 0-10 Pain Score: 8  Pain Location: Rib cage Pain Orientation: Left Pain  Interference Pain Interference Pain Effect on Sleep: 1. Rarely or not at all Pain Interference with Therapy Activities: 1. Rarely or not at all Pain Interference with Day-to-Day Activities: 1. Rarely or not at all [place]  Living Living Arrangements: Spouse/significant other Available Help at Discharge: Family Type of Home: House Home Access: Stairs to enter Technical brewer of Steps: 1 Entrance Stairs-Rails: None Home Layout: One level Bathroom Shower/Tub: Chiropodist: Standard Additional Comments: spouse works 20 hrs/week (taking a leave and willing to leave completely)  Lives With: Spouse Prior Function Level of Independence: Needs assistance with ADLs;Independent with transfers;Independent with gait;Needs assistance with homemaking  Able to Take Stairs?: Yes Driving: No Vocation: Retired Vision/Perception  Vision - History Ability to See in Adequate Light: 0 Adequate Perception Perception: Impaired Inattention/Neglect: Does not attend to left side of body Praxis Praxis: Impaired Praxis Impairment Details: Initiation;Motor planning;Perseveration  Cognition Overall Cognitive Status: History of cognitive impairments - at baseline Arousal/Alertness: Awake/alert Orientation Level: Oriented X4 Year: 2023 Month: January Day of Week: Incorrect (Friday) Memory: Impaired Memory Impairment: Decreased recall of new information;Decreased short term memory Decreased Short Term Memory: Functional basic;Verbal basic Immediate Memory Recall: Sock;Blue;Bed Memory Recall Sock: Not able to recall Memory Recall Blue: Not able to recall Memory Recall Bed: Not able to recall Awareness: Impaired Awareness Impairment: Intellectual impairment Problem Solving: Impaired Problem Solving Impairment: Functional basic;Verbal basic Executive Function: Decision Making;Sequencing Sequencing: Impaired Sequencing Impairment: Verbal  basic;Functional basic Decision Making: Impaired Decision Making Impairment: Functional basic Behaviors: Impulsive;Restless Safety/Judgment: Impaired Sensation Sensation Light Touch: Appears Intact Hot/Cold: Not tested Proprioception: Impaired Detail Proprioception Impaired Details: Impaired LUE;Impaired LLE Stereognosis: Appears Intact Coordination Gross Motor Movements are Fluid and Coordinated: No Fine Motor Movements are Fluid and Coordinated: No Coordination and Movement Description: dysmetria Finger Nose Finger Test: dysmetric with poor motor planning/sequencing Heel Shin Test: limited AROM, dysmetric with motor perseverations Motor  Motor Motor: Hemiplegia;Motor perseverations;Abnormal postural alignment and control Motor - Skilled Clinical Observations: L hemi paresis- PTA, motor perseverations   Trunk/Postural Assessment  Cervical Assessment Cervical Assessment:  (forward head posture) Thoracic Assessment Thoracic Assessment:  (forward flexed; rounded shoulders) Lumbar Assessment Lumbar Assessment:  (posterior pelvic tilt) Postural Control Postural Control: Deficits on evaluation Righting Reactions: delayed and inadequate Protective Responses: delayed and inadequate  Balance Balance Balance Assessed: Yes Standardized Balance Assessment Standardized Balance Assessment: Timed Up and Go Test Timed Up and Go Test TUG: Normal TUG Normal TUG (seconds): 67.34 (ModA + MaxA to control RW) Static Sitting Balance Static Sitting - Balance Support: Feet supported Static Sitting - Level of Assistance: 5: Stand by assistance Dynamic Sitting Balance Dynamic Sitting - Balance Support: Feet supported;During functional activity Dynamic Sitting - Level of Assistance: 4: Min Insurance risk surveyor Standing - Balance Support: During functional activity Static Standing - Level of Assistance: 4: Min assist Dynamic Standing Balance Dynamic Standing - Balance  Support: During functional activity Dynamic Standing - Level of Assistance: 3: Mod assist Extremity Assessment  RUE Assessment Active Range of Motion (AROM) Comments: 0-110 degrees General Strength Comments: overall strength 4/5; uses right as dominant LUE Assessment LUE Assessment: Exceptions to [facility] General Strength Comments: UE functions the same as PTA (wife present in evaulation ) often postures it by holding it out. Pt/ family refer to arm as "Laurey Arrow" needs to be encouraged to use it consistantly in functional tasks LUE Body System: Neuro Brunstrum levels for arm and hand: Arm;Hand Brunstrum level for arm: Stage IV Movement is deviating from synergy Brunstrum level for hand: Stage V Independence from basic synergies RLE Assessment RLE Assessment: Exceptions to [facility] General Strength Comments: difficult to assess 2/2 cog deficits, can move vs gravity RLE Strength Right Hip Flexion: 3/5 Right Hip  ABduction: 3/5 Right Hip ADduction: 3/5 Right Knee Flexion: 3/5 Right Knee Extension: 3/5 Right Ankle Dorsiflexion: 3/5 Right Ankle Plantar Flexion: 3/5 LLE Assessment LLE Assessment: Exceptions to [facility] General Strength Comments: difficult to assess 2/2 cog deficits, can move vs gravity LLE Strength Left Hip Flexion: 3/5 Left Hip ABduction: 3/5 Left Hip ADduction: 3/5 Left Knee Flexion: 3/5 Left Knee Extension: 3/5 Left Ankle Dorsiflexion: 3/5 Left Ankle Plantar Flexion: 3/5  Care Tool Care Tool Bed Mobility Roll left and right activity   Roll left and right assist level: Moderate Assistance - Patient 50 - 74%    Sit to lying activity   Sit to lying assist level: Moderate Assistance - Patient 50 - 74%    Lying to sitting on side of bed activity   Lying to sitting on side of bed assist level: the ability to move from lying on the back to sitting on the side of the bed with no back support.: Moderate Assistance - Patient 50 - 74%     Care Tool Transfers Sit to stand transfer    Sit to stand assist level: Moderate Assistance - Patient 50 - 74%    Chair/bed transfer   Chair/bed transfer assist level: Moderate Assistance - Patient 50 - 74%     Toilet transfer Toilet transfer activity did not occur: Safety/medical concerns Assist Level: Moderate Assistance - Patient 50 - 74%    Car transfer Car transfer activity did not occur: Safety/medical concerns        Care Tool Locomotion Ambulation   Assist level: Moderate Assistance - Patient 50 - 74% Assistive device: Walker-rolling Max distance: 25  Walk 10 feet activity   Assist level: Moderate Assistance - Patient - 50 - 74% Assistive device: Walker-rolling   Walk 50 feet with 2 turns activity Walk 50 feet with 2 turns activity did not occur: Safety/medical concerns      Walk 150 feet activity Walk 150 feet activity did not occur: Safety/medical concerns      Walk 10 feet on uneven surfaces activity Walk 10 feet on uneven surfaces activity did not occur: Safety/medical concerns      Stairs Stair activity did not occur: Safety/medical concerns        Walk up/down 1 step activity Walk up/down 1 step or curb (drop down) activity did not occur: Safety/medical concerns      Walk up/down 4 steps activity Walk up/down 4 steps activity did not occur: Safety/medical concerns      Walk up/down 12 steps activity Walk up/down 12 steps activity did not occur: Safety/medical concerns      Pick up small objects from floor Pick up small object from the floor (from standing position) activity did not occur: Safety/medical concerns      Wheelchair Is the patient using a wheelchair?: Yes Type of Wheelchair: Manual   Wheelchair assist level: Total Assistance - Patient < 25% Max wheelchair distance: 150  Wheel 50 feet with 2 turns activity   Assist Level: Total Assistance - Patient < 25%  Wheel 150 feet activity   Assist Level: Total Assistance - Patient < 25%    Refer to Care Plan for Long Term Goals  SHORT  TERM GOAL WEEK 1 PT Short Term Goal 1 (Week 1): Patient will complete supine <> sit with MinA PT Short Term Goal 2 (Week 1): Patient will compelte sit <> stand with MinA and LRAD PT Short Term Goal 3 (Week 1): Patient will ambulate >51ft with LRAD and MinA PT  Short Term Goal 4 (Week 1): Patient will initiate stair training  Recommendations for other services: Therapeutic Recreation  Stress management  Skilled Therapeutic Intervention Mobility Bed Mobility Bed Mobility: Rolling Right;Rolling Left;Supine to Sit;Sit to Supine Rolling Right: Moderate Assistance - Patient 50-74% Rolling Left: Moderate Assistance - Patient 50-74% Supine to Sit: Maximal Assistance - Patient - Patient 25-49% Sit to Supine: Maximal Assistance - Patient 25-49% Transfers Transfers: Sit to Stand;Stand to Sit;Stand Pivot Transfers Sit to Stand: Moderate Assistance - Patient 50-74% Stand to Sit: Minimal Assistance - Patient > 75% Stand Pivot Transfers: Moderate Assistance - Patient 50 - 74% Stand Pivot Transfer Details: Verbal cues for technique;Verbal cues for sequencing;Verbal cues for precautions/safety;Visual cues for safe use of DME/AE;Tactile cues for posture;Tactile cues for sequencing;Tactile cues for initiation Transfer (Assistive device): Rolling walker Locomotion  Gait Ambulation: Yes Gait Assistance: Moderate Assistance - Patient 50-74% Gait Distance (Feet): 25 Feet Assistive device: Rolling walker Gait Assistance Details: Verbal cues for precautions/safety;Verbal cues for safe use of DME/AE;Verbal cues for sequencing;Verbal cues for gait pattern;Verbal cues for technique Gait Gait: Yes Gait Pattern: Impaired Gait Pattern: Step-through pattern;Poor foot clearance - right;Poor foot clearance - left;Shuffle;Trunk flexed;Narrow base of support Gait velocity: decr Stairs / Additional Locomotion Stairs: No Architect: Yes Wheelchair Assistance: Total Assistance - Patient  <25% Wheelchair Propulsion: Both upper extremities Wheelchair Parts Management: Needs assistance Distance: 150  Patient received sitting up in wc, family present, agreeable to PT eval. He reports 8/10 pain in L side, premedicated. PT providing rest breaks, distractions and repositioning to assist with pain management. He requires grossly ModA with functional mobility. He presents with poor motor planning, motor perseverations and delayed/inadequate balance strategies. Patient with noted L inattention at times as well. Patient remaining up in wc, seatbelt alarm on, call light within reach, wife at bedside.   Discharge Criteria: Patient will be discharged from PT if patient refuses treatment 3 consecutive times without medical reason, if treatment goals not met, if there is a change in medical status, if patient makes no progress towards goals or if patient is discharged from hospital.  The above assessment, treatment plan, treatment alternatives and goals were discussed and mutually agreed upon: by patient and by family  Debbora Dus 07/30/2021, 2:00 PM

## 2021-07-30 NOTE — Progress Notes (Signed)
Physical Therapy Session Note ° °Patient Details  °Name: William [name] °MRN: [id number] °Date of Birth: 12/27/1940 ° °Today's Date: 07/30/2021 °PT Individual Time: 1500-1530 °PT Individual Time Calculation (min): 30 min  ° °Short Term Goals: °Week 1:  PT Short Term Goal 1 (Week 1): Patient will complete supine <> sit with MinA °PT Short Term Goal 2 (Week 1): Patient will compelte sit <> stand with MinA and LRAD °PT Short Term Goal 3 (Week 1): Patient will ambulate >50ft with LRAD and MinA °PT Short Term Goal 4 (Week 1): Patient will initiate stair training ° °Skilled Therapeutic Interventions/Progress Updates:  °Pt met sitting in w/c, wife present and asking multiple questions throughout time in the room, reported 9/10 pain due to fx, and agreeable to PT. Wife stated lidocane patch present for rib fx pain. Dependent transfer to 4th floor ortho gym for time. Pt educated on car transfer technique but unable to complete due to car being unavailable. Gait training initiated w/ RW minA for balance and heavy modA for instructional cues to increase stride length, step width, avoid obstacles, and RW management during ambulation of ~50ft. Pt demonstrated festining like gait pattern throughout session. Dependent transfer back to room, stand pivot transfer minA from w/c<>bed w/ modA instructional cues during transfer. Pt froze in standing prior to sitting on bed causing uncontrolled decent to bed. MaxA to scoot toward HOB while sitting EOB. modA sit<>supine in bed for LE and trunk support. Pt left supine in bed w/ HOB raised, bed alarm on, wife present, call bell in hand, and all needs met. ° °Therapy Documentation °Precautions:  °Precautions °Precautions: Fall °Precaution Comments: L 11th and 12th rib fxs °Restrictions °Weight Bearing Restrictions: No °General: °   ° °Therapy/Group: Individual Therapy ° °  ° M , SPT ° °07/30/2021, 2:56 PM  °

## 2021-07-30 NOTE — Evaluation (Signed)
Occupational Therapy Assessment and Plan  Patient Details  Name: William [name] MRN: [id number] Date of Birth: October 04, 1940  OT Diagnosis: hemiplegia affecting dominant side and muscle weakness (generalized) Rehab Potential: Rehab Potential (ACUTE ONLY): Fair ELOS: ~ 10-14 days   Today's Date: 07/30/2021 OT Individual Time: 1000-1100 OT Individual Time Calculation (min): 60 min     Hospital Problem: Principal Problem:   Basal ganglia infarction [facility])   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   Mild, related to stroke history   Nocturia    Renal insufficiency 06/25/2013   S/P carotid endarterectomy    BILATERAL ICA--  PATENT PER DUPLEX  05-19-2012   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   Right calf   Urinary frequency    Vitamin D deficiency    Past Surgical History:  Past Surgical History:  Procedure Laterality Date   APPENDECTOMY  AS CHILD   CARDIOVASCULAR STRESS TEST  03-27-2012  DR CRENSHAW   LOW RISK LEXISCAN STUDY-- PROBABLE NORMAL PERFUSION AND SOFT TISSUE ATTENUATION/  NO ISCHEMIA/ EF 51%   CAROTID ENDARTERECTOMY Bilateral LEFT  11-12-2008  DR GREG HAYES   RIGHT ICA  2006  ([facility])   CYSTOSCOPY W/ RETROGRADES Bilateral 06/22/2021   Procedure: CYSTOSCOPY WITH RETROGRADE PYELOGRAM;  Surgeon: Franchot Gallo, MD;  Location:  [facility];  Service: Urology;  Laterality: Bilateral;   CYSTOSCOPY WITH LITHOLAPAXY N/A 02/26/2013   Procedure: CYSTOSCOPY WITH LITHOLAPAXY;  Surgeon: Franchot Gallo, MD;  Location: [facility];  Service: Urology;  Laterality: N/A;   EYE SURGERY  Jan. 2016   cataract surgery both eyes   INGUINAL HERNIA REPAIR Right 11-08-2006   IR KYPHO EA ADDL LEVEL THORACIC OR LUMBAR  02/12/2021   IR RADIOLOGIST EVAL & MGMT  02/18/2021   MASS EXCISION N/A 03/03/2016   Procedure: EXCISION OF BACK  MASS;  Surgeon: Stark Klein, MD;  Location: [place];  Service: General;  Laterality: N/A;   MOHS SURGERY Left 1/ 2016   Dr Nevada Crane-- Basal cell   PROSTATE SURGERY     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-C N/A 06/22/2021   Procedure: TRANSURETHRAL RESECTION OF BLADDER TUMOR;  Surgeon: Franchot Gallo, MD;  Location: [facility];  Service: Urology;  Laterality: N/A;   TRANSURETHRAL RESECTION OF PROSTATE N/A 02/26/2013   Procedure: TRANSURETHRAL RESECTION OF THE PROSTATE WITH GYRUS INSTRUMENTS;  Surgeon: Franchot Gallo, MD;  Location: [facility];  Service: Urology;  Laterality: N/A;   TRANSURETHRAL RESECTION OF PROSTATE N/A 06/22/2021   Procedure: TRANSURETHRAL RESECTION OF THE PROSTATE (TURP);  Surgeon: Franchot Gallo, MD;  Location: [facility];  Service: Urology;  Laterality: N/A;    Assessment & Plan Clinical Impression: Patient is a 81 y.o.  year old male history of previous left MCA infarction with mild neurocognitive disorder who initially presented to [facility] emergency department on 2/8 for altered mental status.  MRI and CT were negative.  Since was concern for UTI and he was started on cefdinir and discharged home.  The following day on 2/9, he presented to [facility] with worsening confusion and slurred speech, as well as vomiting of coffee-ground type emesis.  His wife  found him on the bathroom floor and he was lethargic and not responding as usual.  CT of the head and cervical spine was significant for an acute perforator infarct at the left basal ganglia and corona radiata.  Neurology consultation was obtained.  Large remote right MCA infarction involving most of that territory was again noted.  The scan of the abdomen and pelvis performed secondary to upper abdominal pain and tenderness.  Findings included thickened gallbladder wall, 11th and 12th rib fractures and T12-L3 lateral transverse processes fractures.  Ultrasound of the right upper quadrant revealed gallstones.  General surgery consulted, HIDA scan performed negative for acute cholecystitis.  Tolerating heart healthy diet with thin liquids.  The patient is observed to have residual left-sided weakness from previous stroke as well as overall generalized weakness.  The patient requires inpatient medicine and rehabilitation evaluations and services for ongoing dysfunction secondary to basal ganglia infarct.   Relevant medical history includes history of bladder cancer, carotid artery disease status post bilateral carotid endarterectomies, chronic kidney disease stage III, hypertension and hyperlipidemia.  He is maintained on aspirin.  He is also on carbamazepine for mood.  No history of seizure activity.  Plavix initiated.  Continue Plavix and aspirin for 3 months followed by Plavix alone. Wife is very supportive and is able to provide 24/7 at home.   Patient transferred to CIR on 07/29/2021 .    Patient currently requires  mod to max A   with basic self-care skills and and mod A for functional mobility   secondary to muscle weakness, decreased cardiorespiratoy endurance, impaired timing and sequencing, abnormal tone, unbalanced muscle activation, motor apraxia, decreased coordination, and decreased motor planning, decreased initiation, decreased attention, decreased awareness, decreased problem solving, decreased  safety awareness, decreased memory, and delayed processing, and decreased standing balance, decreased postural control, hemiplegia, and decreased balance strategies.  Prior to hospitalization, patient could complete ADLs with  mod to max A before (especially for LB dressing) and needed total A for all IADLs .  Patient will benefit from skilled intervention to decrease level of assist with basic self-care skills and increase independence with basic self-care skills prior to discharge home with care partner.  Anticipate patient will require minimal physical assistance and follow up home health.  OT - End of Session Activity Tolerance: Tolerates 10 - 20 min activity with multiple rests Endurance Deficit: Yes OT Assessment Rehab Potential (ACUTE ONLY): Fair OT Patient demonstrates impairments in the following area(s): Behavior;Balance;Perception;Cognition;Edema;Motor;Endurance;Safety OT Basic ADL's Functional Problem(s): Grooming;Bathing;Dressing;Toileting OT Transfers Functional Problem(s): Toilet;Tub/Shower OT Additional Impairment(s): Fuctional Use of Upper Extremity OT Plan OT Intensity: Minimum of 1-2 x/day, 45 to 90 minutes OT Frequency: 5 out of 7 days OT Duration/Estimated Length of Stay: ~ 10-14 days OT Treatment/Interventions: Balance/vestibular training;Functional electrical stimulation;Discharge planning;Pain management;Self Care/advanced ADL retraining;Therapeutic Activities;Cognitive remediation/compensation;Disease mangement/prevention;Functional mobility training;Patient/family education;Skin care/wound managment;Community reintegration;DME/adaptive equipment instruction;Neuromuscular re-education;Psychosocial support;Splinting/orthotics;UE/LE Strength taining/ROM OT Self Feeding Anticipated Outcome(s): n/a OT Basic Self-Care Anticipated Outcome(s): min A overall OT Toileting Anticipated Outcome(s): min A OT Bathroom Transfers Anticipated  Outcome(s): contact guard OT  Recommendation Patient destination: Home Follow Up Recommendations: Home health OT Equipment Recommended: Tub/shower bench   OT Evaluation Precautions/Restrictions  Precautions Precautions: Fall Precaution Comments: L 11th and 12th rib fxs Restrictions Weight Bearing Restrictions: No General Chart Reviewed: Yes Family/Caregiver Present: Yes Vital Signs  Pain Pain Assessment Pain Scale: 0-10 Pain Score: 8  Pain Location: Rib cage Pain Orientation: Left Home Living/Prior Functioning Home Living Family/patient expects to be discharged to:: Private residence Living Arrangements: Spouse/significant other Available Help at Discharge: Family Type of Home: House Home Access: Stairs to enter Technical brewer of Steps: 1 Entrance Stairs-Rails: None Home Layout: One level Bathroom Shower/Tub: Chiropodist: Standard Additional Comments: spouse works 20 hrs/week (taking a leave and willing to leave completely)  Lives With: Spouse Prior Function Level of Independence: Needs assistance with ADLs, Independent with transfers, Independent with gait, Needs assistance with homemaking  Able to Take Stairs?: Yes Driving: No Vocation: Retired Surveyor, mining Baseline Vision/History: 1 Wears glasses Ability to See in Adequate Light: 0 Adequate Patient Visual Report: No change from baseline Vision Assessment?: No apparent visual deficits Perception  Perception: Impaired Inattention/Neglect: Does not attend to left side of body Praxis Praxis: Impaired Praxis Impairment Details: Initiation;Motor planning;Perseveration Cognition Overall Cognitive Status: History of cognitive impairments - at baseline Arousal/Alertness: Awake/alert Orientation Level: Person;Place;Situation Person: Oriented Place: Oriented Situation: Oriented Year: 2023 Month: January Day of Week: Incorrect (Friday) Memory: Impaired Memory Impairment: Decreased recall of new information;Decreased  short term memory Decreased Short Term Memory: Functional basic;Verbal basic Immediate Memory Recall: Sock;Blue;Bed Memory Recall Sock: Not able to recall Memory Recall Blue: Not able to recall Memory Recall Bed: Not able to recall Awareness: Impaired Awareness Impairment: Intellectual impairment Problem Solving: Impaired Problem Solving Impairment: Functional basic;Verbal basic Executive Function: Decision Making;Sequencing Sequencing: Impaired Sequencing Impairment: Verbal basic;Functional basic Decision Making: Impaired Decision Making Impairment: Functional basic Behaviors: Impulsive;Restless Safety/Judgment: Impaired Sensation Sensation Light Touch: Appears Intact Hot/Cold: Not tested Proprioception: Impaired Detail Proprioception Impaired Details: Impaired LUE;Impaired LLE Stereognosis: Appears Intact Coordination Gross Motor Movements are Fluid and Coordinated: No Fine Motor Movements are Fluid and Coordinated: No Coordination and Movement Description: dysmetria Finger Nose Finger Test: dysmetric with poor motor planning/sequencing Heel Shin Test: limited AROM, dysmetric with motor perseverations Motor  Motor Motor: Hemiplegia;Motor perseverations;Abnormal postural alignment and control Motor - Skilled Clinical Observations: L hemi paresis- PTA, motor perseverations  Trunk/Postural Assessment  Cervical Assessment Cervical Assessment:  (forward head posture) Thoracic Assessment Thoracic Assessment:  (forward flexed; rounded shoulders) Lumbar Assessment Lumbar Assessment:  (posterior pelvic tilt) Postural Control Postural Control: Deficits on evaluation Righting Reactions: delayed and inadequate Protective Responses: delayed and inadequate  Balance Balance Balance Assessed: Yes Standardized Balance Assessment Standardized Balance Assessment: Timed Up and Go Test Timed Up and Go Test TUG: Normal TUG Normal TUG (seconds): 67.34 (ModA + MaxA to control  RW) Static Sitting Balance Static Sitting - Balance Support: Feet supported Static Sitting - Level of Assistance: 5: Stand by assistance Dynamic Sitting Balance Dynamic Sitting - Balance Support: Feet supported;During functional activity Dynamic Sitting - Level of Assistance: 4: Min Insurance risk surveyor Standing - Balance Support: During functional activity Static Standing - Level of Assistance: 4: Min assist Dynamic Standing Balance Dynamic Standing - Balance Support: During functional activity Dynamic Standing - Level of Assistance: 3: Mod assist Extremity/Trunk Assessment RUE Assessment Active Range of Motion (AROM) Comments: 0-110 degrees General Strength Comments: overall strength 4/5; uses right as dominant LUE Assessment LUE Assessment: Exceptions to  [facility] General Strength Comments: UE functions the same as PTA (wife present in evaulation ) often postures it by holding it out. Pt/ family refer to arm as "Laurey Arrow" needs to be encouraged to use it consistantly in functional tasks LUE Body System: Neuro Brunstrum levels for arm and hand: Arm;Hand Brunstrum level for arm: Stage IV Movement is deviating from synergy Brunstrum level for hand: Stage V Independence from basic synergies  Care Tool Care Tool Self Care Eating   Eating Assist Level: Minimal Assistance - Patient > 75%    Oral Care    Oral Care Assist Level: Moderate Assistance - Patient 50 - 74% (needed A to put in dentures)    Bathing   Body parts bathed by patient: Right arm;Left arm;Chest;Abdomen;Front perineal area;Buttocks;Right upper leg;Left upper leg;Face Body parts bathed by helper: Right lower leg;Left lower leg   Assist Level: Minimal Assistance - Patient > 75%    Upper Body Dressing(including orthotics)   What is the patient wearing?: Pull over shirt   Assist Level: Maximal Assistance - Patient 25 - 49%    Lower Body Dressing (excluding footwear)   What is the patient wearing?:  Incontinence brief;Pants Assist for lower body dressing: Maximal Assistance - Patient 25 - 49%    Putting on/Taking off footwear   What is the patient wearing?: Shoes Assist for footwear: Contact Guard/Touching assist       Care Tool Toileting Toileting activity   Assist for toileting: Total Assistance - Patient < 25%     Care Tool Bed Mobility Roll left and right activity   Roll left and right assist level: Moderate Assistance - Patient 50 - 74% (due to pain)    Sit to lying activity        Lying to sitting on side of bed activity   Lying to sitting on side of bed assist level: the ability to move from lying on the back to sitting on the side of the bed with no back support.: Moderate Assistance - Patient 50 - 74%     Care Tool Transfers Sit to stand transfer   Sit to stand assist level: Moderate Assistance - Patient 50 - 74%    Chair/bed transfer   Chair/bed transfer assist level: Moderate Assistance - Patient 50 - 74%     Toilet transfer   Assist Level: Moderate Assistance - Patient 50 - 74%     Care Tool Cognition  Expression of Ideas and Wants Expression of Ideas and Wants: 2. Frequent difficulty - frequently exhibits difficulty with expressing needs and ideas  Understanding Verbal and Non-Verbal Content Understanding Verbal and Non-Verbal Content: 3. Usually understands - understands most conversations, but misses some part/intent of message. Requires cues at times to understand   Memory/Recall Ability Memory/Recall Ability : That he or she is in a hospital/hospital unit   Refer to Care Plan for Long Term Goals  SHORT TERM GOAL WEEK 1 OT Short Term Goal 1 (Week 1): Pt will performed 2/3 toileting tasks with contact guard OT Short Term Goal 2 (Week 1): Pt will don shirt with mod A OT Short Term Goal 3 (Week 1): Pt will perform toilet transfer (on/off) with contact guard OT Short Term Goal 4 (Week 1): Pt will use bilateral hands in all functional tasks with mod  VC OT Short Term Goal 5 (Week 1): Pt will stand at the sink to perform 2 functional tasks with contact guard for balance  Recommendations for other services: None    Skilled  Therapeutic Intervention  Ot eval initiated with OT purpose, role and goals discussed with pt, pt's wife and pt's son. Pt received in the bed. Pt with ongoing pain along his left flank. Pt able ot come to EOB with mod A with extra time due to pain. Pt oriented to person, place and situation but not time. Pt transferred into w/c with mod A to stand and min A to pivot to w/c. Performed toilet transfer and then short HHA ambulation to the shower with min A with repetitive  practice. Pt does require total A with multimodal cues for sequencing with all tasks and repeated what clinician was saying with each step. Pt's wife and son confirm that his performance with dressing is very similar to his performance with dressing PTA. Did not set LB dressing goals since it is the same. Pt does present with fatigue with tasks. Pt performed grooming at the sink in seated position. Pt does present with motorical perseverations throughout session.  Left pt sitting up in the w/c with safety belt donned with family present and SW entered.  ADL ADL Eating: Supervision/safety Grooming: Minimal assistance Upper Body Bathing: Minimal assistance Where Assessed-Upper Body Bathing: Shower Lower Body Bathing: Minimal assistance Where Assessed-Lower Body Bathing: Shower Upper Body Dressing: Maximal assistance Where Assessed-Upper Body Dressing: Wheelchair Lower Body Dressing: Maximal assistance Where Assessed-Lower Body Dressing: Wheelchair Toileting: Maximal assistance Where Assessed-Toileting: Toilet;Bedside Commode Toilet Transfer: Minimal assistance Social research officer, government: Moderate assistance Social research officer, government Method: Heritage manager: Manufacturing systems engineer  Bed Mobility Bed Mobility: Rolling Right;Rolling  Left;Supine to Sit;Sit to Supine Transfers Sit to Stand: Moderate Assistance - Patient 50-74% Stand to Sit: Minimal Assistance - Patient > 75%   Discharge Criteria: Patient will be discharged from OT if patient refuses treatment 3 consecutive times without medical reason, if treatment goals not met, if there is a change in medical status, if patient makes no progress towards goals or if patient is discharged from hospital.  The above assessment, treatment plan, treatment alternatives and goals were discussed and mutually agreed upon: by patient  Prentiss, Polio 07/30/2021, 1:52 PM

## 2021-07-31 ENCOUNTER — Inpatient Hospital Stay (HOSPITAL_COMMUNITY): Payer: PPO

## 2021-07-31 DIAGNOSIS — I951 Orthostatic hypotension: Secondary | ICD-10-CM

## 2021-07-31 DIAGNOSIS — Z8673 Personal history of transient ischemic attack (TIA), and cerebral infarction without residual deficits: Secondary | ICD-10-CM

## 2021-07-31 DIAGNOSIS — I679 Cerebrovascular disease, unspecified: Secondary | ICD-10-CM

## 2021-07-31 LAB — GLUCOSE, CAPILLARY
Glucose-Capillary: 130 mg/dL — ABNORMAL HIGH (ref 70–99)
Glucose-Capillary: 127 mg/dL — ABNORMAL HIGH (ref 70–99)
Glucose-Capillary: 128 mg/dL — ABNORMAL HIGH (ref 70–99)

## 2021-07-31 MED ORDER — VITAMIN D (ERGOCALCIFEROL) 1.25 MG (50000 UNIT) PO CAPS
50000.0000 [IU] | ORAL_CAPSULE | ORAL | Status: DC
Start: 2021-07-31 — End: 2021-07-31
  Filled 2021-07-31: qty 1

## 2021-07-31 MED ORDER — DOCUSATE SODIUM 100 MG PO CAPS
100.0000 mg | ORAL_CAPSULE | Freq: Every day | ORAL | Status: DC
  Administered 2021-07-31 – 2021-08-18 (×19): 100 mg via ORAL
  Filled 2021-07-31 (×19): qty 1

## 2021-07-31 MED ORDER — SODIUM CHLORIDE 0.45 % IV SOLN
INTRAVENOUS | Status: DC
Start: 2021-07-31 — End: 2021-08-10

## 2021-07-31 MED ORDER — POTASSIUM CHLORIDE 20 MEQ PO PACK
20.0000 meq | PACK | Freq: Every day | ORAL | Status: DC
  Administered 2021-07-31 – 2021-08-14 (×15): 20 meq via ORAL
  Filled 2021-07-31 (×16): qty 1

## 2021-07-31 MED ORDER — ENOXAPARIN SODIUM 40 MG/0.4ML IJ SOSY
40.0000 mg | PREFILLED_SYRINGE | INTRAMUSCULAR | Status: DC
  Administered 2021-07-31: 40 mg via SUBCUTANEOUS
  Filled 2021-07-31: qty 0.4

## 2021-07-31 MED ORDER — CEPHALEXIN 250 MG PO CAPS
500.0000 mg | ORAL_CAPSULE | Freq: Two times a day (BID) | ORAL | Status: DC
  Administered 2021-07-31 – 2021-08-03 (×7): 500 mg via ORAL
  Filled 2021-07-31 (×7): qty 2

## 2021-07-31 NOTE — Progress Notes (Signed)
Speech Language Pathology Daily Session Note  Patient Details  Name: William [name] MRN: [id number] Date of Birth: December 09, 1940  Today's Date: 07/31/2021 SLP Individual Time: 0915-1000 SLP Individual Time Calculation (min): 45 min  Short Term Goals: Week 1: SLP Short Term Goal 1 (Week 1): Patient will demonstrate sustained attention to functional tasks for 20 mintues with Mod verbal cues for redirection. SLP Short Term Goal 2 (Week 1): Patient will utilize external aids for orientation to place, time and situation with Mod verbal cues. SLP Short Term Goal 3 (Week 1): Patient will utilize an increaesd vocal intensity at the word level to achieve ~75% intelligibility with Max A multimodal cues. SLP Short Term Goal 4 (Week 1): Patient will demonstrate improved word-finding at the phrase level with Mod A multitmodal cues. SLP Short Term Goal 5 (Week 1): Patient will self-monitor and correct errors during verbal expression with Mod A multimodal cues.  Skilled Therapeutic Interventions: Skilled treatment session focused on communication goals. Upon arrival, patient was awake in the wheelchair and appeared brighter compared to yesterday's session. SLP facilitated session by providing Min A verbal cues for naming functional objects and for identifying similarities between the 2 objects. Mod verbal cues were needed for divergent naming throughout task. Patient continues to demonstrate decreased vocal intensity impacting overall intelligibility. SLP provided biofeedback to maximize awareness of low vocal intensity. Patient responded best to "yell it" to maximize vocal intensity. Patient could use the strategy immediately but unable to carry it over to the next phrase or sentence. Patient left upright in the wheelchair with alarm on and all needs within reach. Continue with current plan of care.      Pain No/Denies Pain   Therapy/Group: Individual Therapy  [name] 07/31/2021, 12:41 PM

## 2021-07-31 NOTE — Progress Notes (Signed)
Occupational Therapy Session Note  Patient Details  Name: William [name] MRN: [id number] Date of Birth: Feb 10, 1941  Today's Date: 07/31/2021 OT Individual Time: 1400-1445 OT Individual Time Calculation (min): 45 min    Short Term Goals: Week 1:  OT Short Term Goal 1 (Week 1): Pt will performed 2/3 toileting tasks with contact guard OT Short Term Goal 2 (Week 1): Pt will don shirt with mod A OT Short Term Goal 3 (Week 1): Pt will perform toilet transfer (on/off) with contact guard OT Short Term Goal 4 (Week 1): Pt will use bilateral hands in all functional tasks with mod VC OT Short Term Goal 5 (Week 1): Pt will stand at the sink to perform 2 functional tasks with contact guard for balance  Skilled Therapeutic Interventions/Progress Updates:    Pt received in bed with family (spouse and son) present. His wife had new clothing laid out for him. This session focused on functional mobility skills, balance, and ADL skills.   Had pt flex both knees and roll onto L side.  He then used L elbow and R hand to push up to sitting, with a cue to look towards the floor.  With min A pt able to sit up well to EOB.  He stated this did not hurt his ribs at all.   Noticed pt's briefs were wet.  Had pt come to stand with only CGA as he did well with forward wt shift and using his legs.  Cued pt to push up with B hands versus reaching for RW.  Once standing,  removed his old brief and then had pt work on self cleansing with wash cloths which he was able to do.  Pt sat back down to don pants over feet with mod A. He stood again to pull pants over hips with min A.   Pt wanted to work on shaving with Copy.  Pt used RW to ambulate to sink taking small shuffling steps.  At sink placed RW to side.  Pt worked on washing his hands with mod cues for turning water on and off, finding soap dispenser and actively using his L hand.   Pt then stood and started using Copy. He asked me to open it to clean out  some of the hair.  I handed it back to him and he continued shaving.   All of a sudden he started leaning forward with his head going towards the sink.  I had my hand on his gait belt to support him while trying to get his attention. Pt not responding, so asked his son to grab his w/c. Helped pt sit down into his w/c.  As soon as he sat down he leaned back and his hips started sliding forward.   Told son I wanted to lower him to the floor as I was not sure what was happening to pt medically.  Son backed up w/c so I could lower him to floor and support his upper back while sitting behind him.  His wife was getting distraught and frightened, so I was trying to calm her while attending to pt.  Asked son to call nursing.  Nursing arrived so we could get his vitals. Pt never lost consciousness, kept his eyes open and was able to respond ok when I was calling his name.  His skin was clammy.  His BP was elevated.  RR team had been notified along with PAs.  RN and I were able to help him  come to stand and take a few steps to his bed.  Medical team then took over his care.   (Spent another 30 min with nursing and family discussing what had happened. Wife explained the exact same thing had happened at home when he was having a 2nd stroke and he fell and fractured his ribs.) no charge for that time.   Therapy Documentation Precautions:  Precautions Precautions: Fall Precaution Comments: L 11th and 12th rib fxs Restrictions Weight Bearing Restrictions: No Pain:  No pain during therapy ADL: ADL Eating: Supervision/safety Grooming: Minimal assistance Upper Body Bathing: Minimal assistance Where Assessed-Upper Body Bathing: Shower Lower Body Bathing: Minimal assistance Where Assessed-Lower Body Bathing: Shower Upper Body Dressing: Maximal assistance Where Assessed-Upper Body Dressing: Wheelchair Lower Body Dressing: Maximal assistance Where Assessed-Lower Body Dressing: Wheelchair Toileting: Maximal  assistance Where Assessed-Toileting: Toilet, Recruitment consultant Transfer: Environmental education officer: Moderate assistance Social research officer, government Method: Heritage manager: Radio broadcast assistant   Therapy/Group: Individual Therapy  [place] 07/31/2021, 12:59 PM

## 2021-07-31 NOTE — Progress Notes (Addendum)
PROGRESS NOTE   Subjective/Complaints: +impulsivity as per nurse UA positive- he has been treated with Keflex in the past- had diarrhea in response to probiotics Still pain on left side  ROS: +insomnia, +left abdominal pain  Objective:   CT ABDOMEN PELVIS WO CONTRAST  Result Date: 07/29/2021 CLINICAL DATA:  Abdominal pain EXAM: CT ABDOMEN AND PELVIS WITHOUT CONTRAST TECHNIQUE: Multidetector CT imaging of the abdomen and pelvis was performed following the standard protocol without IV contrast. RADIATION DOSE REDUCTION: This exam was performed according to the departmental dose-optimization program which includes automated exposure control, adjustment of the mA and/or kV according to patient size and/or use of iterative reconstruction technique. COMPARISON:  CT abdomen/pelvis 04/28/2021, right upper quadrant ultrasound 07/27/2021 FINDINGS: Lower chest: There are patchy opacities in the lung bases. Is mild calcification of the aortic valve. Coronary artery calcification is noted in the left anterior descending artery. Hepatobiliary: The liver is unremarkable, within the confines of noncontrast technique. There is hyperdensity within gallbladder likely reflecting a combination of stones and sludge. There is no appreciable gallbladder wall thickening and no significant pericholecystic fluid or inflammatory change. Pancreas: Unremarkable. Spleen: Unremarkable. Adrenals/Urinary Tract: The adrenals are unremarkable. Multiple right renal cysts are noted, the largest measuring up to 5.2 cm. No other focal lesions are seen, within the confines of noncontrast technique. No stones are seen. There is no hydronephrosis or hydroureter. The bladder is unremarkable. Stomach/Bowel: The stomach is unremarkable, allowing for underdistention. There is no evidence of bowel obstruction. There is no abnormal bowel wall thickening or inflammatory change. There is colonic  diverticulosis without evidence of acute diverticulitis. The appendix is not definitively identified, but there is no pericecal inflammatory change. Vascular/Lymphatic: Is extensive calcified atherosclerotic plaque throughout the nonaneurysmal abdominal aorta. There is no abdominal or pelvic lymphadenopathy. 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 The prostate is mildly enlarged, unchanged. The seminal vesicles are unremarkable. Other: There is no ascites or free air. Musculoskeletal: There is a nondisplaced fracture of the left eleventh and twelfth ribs at the costochondral junctions (3-24, 3-30). There is an additional fracture of the eleventh rib laterally (3-38). There are nondisplaced fractures of the left transverse processes at T12 and L1 (6-133, 6-124). These are new since the prior study from 04/28/2021 and may be related to the recent fall. Vertebral body heights are preserved. Post kyphoplasty changes are seen at L1. There are flowing anterior osteophytes in the imaged lower thoracic spine consistent with diffuse idiopathic skeletal hyperostosis. IMPRESSION: 1. Nondisplaced fractures of the left eleventh and twelfth ribs (the twelfth rib is broken in two places), and left transverse processes at T12 through L3, new since the prior study from November 2022 and likely related to the recent fall. 2. Otherwise, no acute findings in the abdomen or pelvis. 3. Cholelithiasis and probable sludge without evidence of acute cholecystitis. 4. Diverticulosis without evidence of acute diverticulitis. 5. Patchy opacities in the lung bases may reflect atelectasis, though infection can not be excluded. Aortic Atherosclerosis (ICD10-I70.0). Electronically Signed   By: Valetta Mole M.D.   On: 07/29/2021 13:32   Recent Labs    07/29/21 0339 07/30/21 0606  WBC 10.0 8.8  HGB  8.9* 9.1*  HCT 26.1* 27.7*  PLT 168 184   Recent Labs    07/29/21 0339 07/30/21 0606  NA 139 141  K 3.8 3.8  CL 112* 109   CO2 21* 23  GLUCOSE 106* 121*  BUN 26* 23  CREATININE 2.02* 2.05*  CALCIUM 8.4* 8.9    Intake/Output Summary (Last 24 hours) at 07/31/2021 0845 Last data filed at 07/30/2021 1900 Gross per 24 hour  Intake 177 ml  Output --  Net 177 ml        Physical Exam: Vital Signs Blood pressure (!) 157/73, pulse 77, temperature 98 F (36.7 C), resp. rate 14, height 5\' 11"  (1.803 m), weight 95.2 kg, SpO2 97 %. Gen: no distress, normal appearing, BMI 29.27 HEENT: oral mucosa pink and moist, NCAT Cardio: Reg rate Chest: normal effort, normal rate of breathing Abd: soft, non-distended Ext: no edema Psych: pleasant, normal affect Skin: intact Neuro: Alert and oriented x3. RUE 4/5, LUE 2/5 proximally and 4/5 distally, bilateral lower extremities 3/5.  Able to tolerate standing.      Assessment/Plan: 1. Functional deficits which require 3+ hours per day of interdisciplinary therapy in a comprehensive inpatient rehab setting. Physiatrist is providing close team supervision and 24 hour management of active medical problems listed below. Physiatrist and rehab team continue to assess barriers to discharge/monitor patient progress toward functional and medical goals  Care Tool:  Bathing    Body parts bathed by patient: Right arm, Left arm, Chest, Abdomen, Front perineal area, Buttocks, Right upper leg, Left upper leg, Face   Body parts bathed by helper: Right lower leg, Left lower leg     Bathing assist Assist Level: Minimal Assistance - Patient > 75%     Upper Body Dressing/Undressing Upper body dressing   What is the patient wearing?: Pull over shirt    Upper body assist Assist Level: Maximal Assistance - Patient 25 - 49%    Lower Body Dressing/Undressing Lower body dressing      What is the patient wearing?: Incontinence brief, Pants     Lower body assist Assist for lower body dressing: Maximal Assistance - Patient 25 - 49%     Toileting Toileting    Toileting assist  Assist for toileting: Total Assistance - Patient < 25%     Transfers Chair/bed transfer  Transfers assist     Chair/bed transfer assist level: Moderate Assistance - Patient 50 - 74%     Locomotion Ambulation   Ambulation assist      Assist level: Moderate Assistance - Patient 50 - 74% Assistive device: Walker-rolling Max distance: 25   Walk 10 feet activity   Assist     Assist level: Moderate Assistance - Patient - 50 - 74% Assistive device: Walker-rolling   Walk 50 feet activity   Assist Walk 50 feet with 2 turns activity did not occur: Safety/medical concerns         Walk 150 feet activity   Assist Walk 150 feet activity did not occur: Safety/medical concerns         Walk 10 feet on uneven surface  activity   Assist Walk 10 feet on uneven surfaces activity did not occur: Safety/medical concerns         Wheelchair     Assist Is the patient using a wheelchair?: Yes Type of Wheelchair: Manual    Wheelchair assist level: Total Assistance - Patient < 25% Max wheelchair distance: 150    Wheelchair 50 feet with 2 turns activity  Assist        Assist Level: Total Assistance - Patient < 25%   Wheelchair 150 feet activity     Assist      Assist Level: Total Assistance - Patient < 25%   Blood pressure (!) 157/73, pulse 77, temperature 98 F (36.7 C), resp. rate 14, height 5\' 11"  (1.803 m), weight 95.2 kg, SpO2 97 %.  Medical Problem List and Plan: 1. 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 Discussed estimate of 2-3 week stay with wife HFU scheduled.  Continue CIR 2.  Impaired mobility:  -DVT/anticoagulation:  Mechanical: Sequential compression devices, below knee Bilateral lower extremities; start Lovenox.               -antiplatelet therapy: aspirin 3. Rib fracture pain: Tylenol, tramadol as needed. Start lidocaine patch. Discussed that this should be applied directly over area of pain 4. Depression: Start trazodone 50mg  HS LCSW asked to evaluate and provide emotional support             --continue carbamazepine              -antipsychotic agents: Not applicable 5. Neuropsych: This patient is not capable of making decisions on his own behalf. 6. Skin/Wound Care: Routine skin care checks 7. 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 Discussed labs with wife.  --Monitor urine output and chemistries 16: Transaminitis: Monitor chemistries 17: Hypertension: Continue Norvasc and hydralazine             -- Home Zestoretic held secondary to kidney function. Discussed this with patient.  18: Hyperlipidemia: Continue fenofibrate 19: Bilateral carotid artery stenosis: Asymptomatic             -- Status post bilateral carotid endarterectomies 20: Anemia, chronic disease, hemodilution etiologies likely: Monitor CBC             -- Fecal occult blood negative 2/13             -- Colonoscopy 2016, polyps.  Recall 5 years 21. Prediabetes: check CBGs AC/HS.  22. Hypokalemia: supplement 16meq on 2/17.  23. Suboptimal vitamin D level: continue 2000U daily.  24. Insomnia: start trazodone 50mg  HS. Start sleep chart.  LOS: 2 days A FACE TO FACE EVALUATION WAS  PERFORMED  Martha Clan P Elika Godar 07/31/2021, 8:45 AM

## 2021-07-31 NOTE — Progress Notes (Signed)
Physical Therapy Session Note  Patient Details  Name: WASSIM KIRKSEY MRN: [id number] Date of Birth: [birth year]  Today's Date: 07/31/2021 PT Missed Time: 81 Minutes Missed Time Reason: Unavailable (Comment) (Pt unavailbe due to fall in previous OT session)    Skilled Therapeutic Interventions/Progress Updates:    Pt unavailable due to fall in previous OT session. Medical personnel at bedside. Missed 30 minutes of PT.  Therapy Documentation Precautions:  Precautions Precautions: Fall Precaution Comments: L 11th and 12th rib fxs Restrictions Weight Bearing Restrictions: No General: PT Amount of Missed Time (min): 30 Minutes PT Missed Treatment Reason: Unavailable (Comment) (Pt unavailbe due to fall in previous OT session)    Therapy/Group: Individual Therapy  Lucile Shutters, SPT  07/31/2021, 3:18 PM

## 2021-07-31 NOTE — Progress Notes (Signed)
IVT consulted for difficult stick. Per RN Josh, PIV is  not needed.  Will consult IVT if necessary.

## 2021-07-31 NOTE — Discharge Summary (Signed)
Physician Discharge Summary  Patient ID: COLE EASTRIDGE MRN: [id number] DOB/AGE: 18-Apr-1941 81 y.o.  Admit date: 07/29/2021 Discharge date: 08/18/2021  Discharge Diagnoses:  Principal Problem:   Basal ganglia infarction [facility]) Active Problems:   Insomnia   Prediabetes   Chronic kidney disease (CKD), stage IV (severe) (HCC) Functional deficits secondary to left basal ganglia corona radiata infarct Left-sided weakness Neurocognitive disorder Rib fractures T12-L3 transverse process fractures Impulsivity Cholelithiasis Chronic kidney disease stage IV Transaminitis Hypertension Hyperlipidemia Bilateral carotid artery stenosis Anemia, multifactorial Insomnia  Discharged Condition: stable  Significant Diagnostic Studies: CT ABDOMEN PELVIS WO CONTRAST  Result Date: 07/29/2021 CLINICAL DATA:  Abdominal pain EXAM: CT ABDOMEN AND PELVIS WITHOUT CONTRAST TECHNIQUE: Multidetector CT imaging of the abdomen and pelvis was performed following the standard protocol without IV contrast. RADIATION DOSE REDUCTION: This exam was performed according to the departmental dose-optimization program which includes automated exposure control, adjustment of the mA and/or kV according to patient size and/or use of iterative reconstruction technique. COMPARISON:  CT abdomen/pelvis 04/28/2021, right upper quadrant ultrasound 07/27/2021 FINDINGS: Lower chest: There are patchy opacities in the lung bases. Is mild calcification of the aortic valve. Coronary artery calcification is noted in the left anterior descending artery. Hepatobiliary: The liver is unremarkable, within the confines of noncontrast technique. There is hyperdensity within gallbladder likely reflecting a combination of stones and sludge. There is no appreciable gallbladder wall thickening and no significant pericholecystic fluid or inflammatory change. Pancreas: Unremarkable. Spleen: Unremarkable. Adrenals/Urinary Tract: The adrenals are unremarkable.  Multiple right renal cysts are noted, the largest measuring up to 5.2 cm. No other focal lesions are seen, within the confines of noncontrast technique. No stones are seen. There is no hydronephrosis or hydroureter. The bladder is unremarkable. Stomach/Bowel: The stomach is unremarkable, allowing for underdistention. There is no evidence of bowel obstruction. There is no abnormal bowel wall thickening or inflammatory change. There is colonic diverticulosis without evidence of acute diverticulitis. The appendix is not definitively identified, but there is no pericecal inflammatory change. Vascular/Lymphatic: Is extensive calcified atherosclerotic plaque throughout the nonaneurysmal abdominal aorta. There is no abdominal or pelvic lymphadenopathy. 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 The prostate is mildly enlarged, unchanged. The seminal vesicles are unremarkable. Other: There is no ascites or free air. Musculoskeletal: There is a nondisplaced fracture of the left eleventh and twelfth ribs at the costochondral junctions (3-24, 3-30). There is an additional fracture of the eleventh rib laterally (3-38). There are nondisplaced fractures of the left transverse processes at T12 and L1 (6-133, 6-124). These are new since the prior study from 04/28/2021 and may be related to the recent fall. Vertebral body heights are preserved. Post kyphoplasty changes are seen at L1. There are flowing anterior osteophytes in the imaged lower thoracic spine consistent with diffuse idiopathic skeletal hyperostosis. IMPRESSION: 1. Nondisplaced fractures of the left eleventh and twelfth ribs (the twelfth rib is broken in two places), and left transverse processes at T12 through L3, new since the prior study from November 2022 and likely related to the recent fall. 2. Otherwise, no acute findings in the abdomen or pelvis. 3. Cholelithiasis and probable sludge without evidence of acute cholecystitis. 4. Diverticulosis without  evidence of acute diverticulitis. 5. Patchy opacities in the lung bases may reflect atelectasis, though infection can not be excluded. Aortic Atherosclerosis (ICD10-I70.0). Electronically Signed   By: Valetta Mole M.D.   On: 07/29/2021 13:32   DG Chest 2 View  Result Date: 08/01/2021 CLINICAL DATA:  Dysphagia EXAM:  CHEST - 2 VIEW COMPARISON:  07/27/2021 FINDINGS: Unchanged cardiomediastinal silhouette. Small left pleural effusion. Adjacent left basilar opacities. No visible pneumothorax. Mild left shoulder osteoarthritis. No acute osseous abnormality. IMPRESSION: Small left pleural effusion. Adjacent left basilar opacities, likely atelectasis. Electronically Signed   By: Maurine Simmering M.D.   On: 08/01/2021 13:28   CT HEAD WO CONTRAST (5MM)  Result Date: 08/01/2021 CLINICAL DATA:  Stroke, follow up EXAM: CT HEAD WITHOUT CONTRAST TECHNIQUE: Contiguous axial images were obtained from the base of the skull through the vertex without intravenous contrast. RADIATION DOSE REDUCTION: This exam was performed according to the departmental dose-optimization program which includes automated exposure control, adjustment of the mA and/or kV according to patient size and/or use of iterative reconstruction technique. COMPARISON:  CT February 17, 23. FINDINGS: Brain: Similar appearance of evolving infarct in the left basal ganglia. Similar 5 mm focus of hemorrhage posteriorly. No progressive mass effect. Similar large remote right MCA territory infarct with encephalomalacia. Similar partial effacement of the left lateral ventricle with ex vacuo ventricular dilation right lateral ventricle. No hydrocephalus. No mass lesion. Vascular: Calcific intracranial atherosclerosis. Skull: No acute fracture. Sinuses/Orbits: Small retention cyst or mucosal thickening in the inferior right maxillary sinus. Otherwise, clear sinuses. Unremarkable orbits. Other: No mastoid effusions. IMPRESSION: 1. Similar appearance of evolving infarct in the  left basal ganglia. Similar 5 mm focus of hemorrhage posteriorly. No progressive mass effect. 2. Large remote right MCA territory infarct. Electronically Signed   By: Margaretha Sheffield M.D.   On: 08/01/2021 13:13   CT HEAD WO CONTRAST (5MM)  Result Date: 07/31/2021 CLINICAL DATA:  Recent CVA EXAM: CT HEAD WITHOUT CONTRAST TECHNIQUE: Contiguous axial images were obtained from the base of the skull through the vertex without intravenous contrast. RADIATION DOSE REDUCTION: This exam was performed according to the departmental dose-optimization program which includes automated exposure control, adjustment of the mA and/or kV according to patient size and/or use of iterative reconstruction technique. COMPARISON:  07/27/2021 CT and MRI FINDINGS: Brain: There has been continued evolution of the left basal ganglia and corona radiata infarct seen previously. In the periphery of the infarct bed there is a 5 mm hyperdense focus consistent with punctate hemorrhage. No significant mass effect. Chronic encephalomalacia throughout the right cerebral hemisphere from prior right MCA territory infarct. Lateral ventricles and midline structures are stable. No acute extra-axial fluid collections. No mass effect or midline shift. Vascular: Stable atherosclerosis.  No hyperdense vessel. Skull: Normal. Negative for fracture or focal lesion. Sinuses/Orbits: Minimal polypoid mucosal thickening right maxillary sinus. Remaining sinuses are clear. Other: None. IMPRESSION: 1. Continued evolution of the left basal ganglia and corona radiata infarct seen previously. New small 5 mm focal hemorrhage within the periphery of the infarct bed. 2. No mass effect or midline shift. 3. Large remote previous right MCA territory infarct and resulting encephalomalacia. Critical Value/emergent results were called by telephone at the time of interpretation on 07/31/2021 at 4:04 pm to provider [facility] , who verbally acknowledged these results.  Electronically Signed   By: Randa Ngo M.D.   On: 07/31/2021 16:10   CT HEAD WO CONTRAST (5MM)  Result Date: 07/27/2021 CLINICAL DATA:  Minor head trauma.  Vomiting and syncope EXAM: CT HEAD WITHOUT CONTRAST CT CERVICAL SPINE WITHOUT CONTRAST TECHNIQUE: Multidetector CT imaging of the head and cervical spine was performed following the standard protocol without intravenous contrast. Multiplanar CT image reconstructions of the cervical spine were also generated. RADIATION DOSE REDUCTION: This exam was performed according  to the departmental dose-optimization program which includes automated exposure control, adjustment of the mA and/or kV according to patient size and/or use of iterative reconstruction technique. COMPARISON:  Brain MRI from 5 days ago FINDINGS: CT HEAD FINDINGS Brain: Interval and acute appearing infarct at the left stratum and corona radiata. Large remote right MCA territory infarct affecting the upper division. Brain atrophy. No acute hemorrhage or hydrocephalus Vascular: No hyperdense vessel or unexpected calcification. Skull: Normal. Negative for fracture or focal lesion. Sinuses/Orbits: No acute finding. CT CERVICAL SPINE FINDINGS Alignment: No traumatic malalignment Skull base and vertebrae: No acute fracture Soft tissues and spinal canal: No prevertebral fluid or swelling. No visible canal hematoma. Disc levels:  Ordinary degenerative changes. 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 These results were called by telephone at the time of interpretation on 07/27/2021 at 4:17 am to provider [facility] , who verbally acknowledged these results. IMPRESSION: 1. 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 2. No posttraumatic intracranial or cervical spine finding. 3. Remote right MCA upper division infarcts. Electronically Signed   By: Jorje Guild M.D.   On: 07/27/2021 04:17   CT HEAD WO CONTRAST  Result Date:  07/22/2021 CLINICAL DATA:  81 year old male with history of altered mental status. EXAM: CT HEAD WITHOUT CONTRAST TECHNIQUE: Contiguous axial images were obtained from the base of the skull through the vertex without intravenous contrast. RADIATION DOSE REDUCTION: This exam was performed according to the departmental dose-optimization program which includes automated exposure control, adjustment of the mA and/or kV according to patient size and/or use of iterative reconstruction technique. COMPARISON:  Head CT 11/13/2020. FINDINGS: Brain: Mild cerebral atrophy. Again noted is a large area of low attenuation throughout the right cerebral hemisphere, compatible with extensive encephalomalacia related to remote right MCA territory infarct. Well-defined foci of low attenuation also noted in the left external capsule, and in the head of the right caudate nucleus, compatible with old lacunar infarcts. No evidence of acute infarction, hemorrhage, hydrocephalus, extra-axial collection or mass lesion/mass effect. Vascular: No hyperdense vessel or unexpected calcification. Skull: Normal. Negative for fracture or focal lesion. Sinuses/Orbits: No acute finding. Other: None. IMPRESSION: 1. No acute intracranial abnormalities. 2. 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 3. Mild cerebral atrophy. Electronically Signed   By: Vinnie Langton M.D.   On: 07/22/2021 06:58   CT CERVICAL SPINE WO CONTRAST  Result Date: 07/27/2021 CLINICAL DATA:  Minor head trauma.  Vomiting and syncope EXAM: CT HEAD WITHOUT CONTRAST CT CERVICAL SPINE WITHOUT CONTRAST TECHNIQUE: Multidetector CT imaging of the head and cervical spine was performed following the standard protocol without intravenous contrast. Multiplanar CT image reconstructions of the cervical spine were also generated. RADIATION DOSE REDUCTION: This exam was performed according  to the departmental dose-optimization program which includes automated exposure control, adjustment of the mA and/or kV according to patient size and/or use of iterative reconstruction technique. COMPARISON:  Brain MRI from 5 days ago FINDINGS: CT HEAD FINDINGS Brain: Interval and acute appearing infarct at the left stratum and corona radiata. Large remote right MCA territory infarct affecting the upper division. Brain atrophy. No acute hemorrhage or hydrocephalus Vascular: No hyperdense vessel or unexpected calcification. Skull: Normal. Negative for fracture or focal lesion. Sinuses/Orbits: No acute finding. CT CERVICAL SPINE FINDINGS Alignment: No traumatic malalignment Skull base and vertebrae: No acute fracture Soft tissues and spinal  canal: No prevertebral fluid or swelling. No visible canal hematoma. Disc levels:  Ordinary degenerative changes. 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 These results were called by telephone at the time of interpretation on 07/27/2021 at 4:17 am to provider [facility] , who verbally acknowledged these results. IMPRESSION: 1. 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 2. No posttraumatic intracranial or cervical spine finding. 3. Remote right MCA upper division infarcts. Electronically Signed   By: Jorje Guild M.D.   On: 07/27/2021 04:17   MR ANGIO HEAD WO CONTRAST  Result Date: 07/27/2021 CLINICAL DATA:  Neuro deficit, acute, stroke suspected EXAM: MRI HEAD WITHOUT CONTRAST MRA HEAD WITHOUT CONTRAST TECHNIQUE: Multiplanar, multi-echo pulse sequences of the brain and surrounding structures were acquired without intravenous contrast. Angiographic images of the Circle of Willis were acquired using MRA technique without intravenous contrast. COMPARISON:  CT head from the same day. MRA August 07, 2005 without report. FINDINGS: MRI HEAD FINDINGS Brain: Acute perforator infarct in the left basal ganglia  and overlying corona radiata. Associated edema without mass effect. Large remote right MCA territory infarct with encephalomalacia and surrounding gliosis. Evidence of some prior hemorrhage along the posterior aspect of this remote infarct. No evidence of acute hemorrhage. Vascular: See below. Skull and upper cervical spine: Normal marrow signal. Sinuses/Orbits: Mild paranasal sinus mucosal thickening. Unremarkable orbits. Other: No sizable mastoid effusions. MRA HEAD FINDINGS Anterior circulation: Bilateral intracranial ICAs are patent. Moderate right paraclinoid ICA stenosis. Subtle apparent filling defect of the right paraclinoid ICA in the region of stenosis is favored to reflect flow artifact given preserved T2 hypointense flow void on MRI. Bilateral MCAs are patent. Severe distal left M1 MCA stenosis. Attenuated distal right MCA vessels in the region of remote infarct. Bilateral ACAs are patent without proximal hemodynamically significant stenosis. Posterior circulation: Only the distal most intradural vertebral arteries are visualized, which are patent. The basilar artery and bilateral posterior cerebral arteries are patent without proximal hemodynamically significant stenosis. Prominent left posterior communicating artery with small or absent left P1 PCA, anatomic variant. Anatomic variants: Detailed above. IMPRESSION: MRI: 1. 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 2. Large remote right MCA territory infarct. MRA: 1. No large vessel occlusion. 2. Severe distal left M1 MCA stenosis. 3. Moderate right paraclinoid ICA stenosis. 4. Attenuated distal right MCA vessels in the region of remote infarct. Electronically Signed   By: Margaretha Sheffield M.D.   On: 07/27/2021 10:06   MR BRAIN WO CONTRAST  Result Date: 07/27/2021 CLINICAL DATA:  Neuro deficit, acute, stroke suspected EXAM: MRI HEAD WITHOUT CONTRAST MRA HEAD WITHOUT CONTRAST TECHNIQUE: Multiplanar, multi-echo  pulse sequences of the brain and surrounding structures were acquired without intravenous contrast. Angiographic images of the Circle of Willis were acquired using MRA technique without intravenous contrast. COMPARISON:  CT head from the same day. MRA August 07, 2005 without report. 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 Associated edema without mass effect. Large remote right MCA territory infarct with encephalomalacia and surrounding gliosis. Evidence of some prior hemorrhage along the posterior aspect of this remote infarct. No evidence of acute hemorrhage. Vascular: See below. Skull and upper cervical spine: Normal marrow signal. Sinuses/Orbits: Mild paranasal sinus mucosal thickening. Unremarkable orbits. Other: No sizable mastoid effusions. MRA HEAD FINDINGS Anterior circulation: Bilateral intracranial ICAs are patent. Moderate right paraclinoid ICA stenosis. Subtle apparent filling defect of the right  paraclinoid ICA in the region of stenosis is favored to reflect flow artifact given preserved T2 hypointense flow void on MRI. Bilateral MCAs are patent. Severe distal left M1 MCA stenosis. Attenuated distal right MCA vessels in the region of remote infarct. Bilateral ACAs are patent without proximal hemodynamically significant stenosis. Posterior circulation: Only the distal most intradural vertebral arteries are visualized, which are patent. The basilar artery and bilateral posterior cerebral arteries are patent without proximal hemodynamically significant stenosis. Prominent left posterior communicating artery with small or absent left P1 PCA, anatomic variant. Anatomic variants: Detailed above. IMPRESSION: MRI: 1. 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 2. Large remote right MCA territory infarct. MRA: 1. No large vessel occlusion. 2. Severe distal left M1 MCA stenosis. 3. Moderate right  paraclinoid ICA stenosis. 4. Attenuated distal right MCA vessels in the region of remote infarct. Electronically Signed   By: Margaretha Sheffield M.D.   On: 07/27/2021 10:06   MR BRAIN WO CONTRAST  Result Date: 07/22/2021 CLINICAL DATA:  Neuro deficit, acute, stroke suspected Patient had worsened left-sided facial droop and altered mental status this morning. Now improving. Rule out acute stroke. EXAM: MRI HEAD WITHOUT CONTRAST TECHNIQUE: Multiplanar, multiecho pulse sequences of the brain and surrounding structures were obtained without intravenous contrast. COMPARISON:  Same day CT head.  MRI head August 14, 2020. FINDINGS: Motion limited study. Brain: Similar large area predominately right MCA territory encephalomalacia. Surrounding gliosis. Similar small remote lacunar infarct in the left caudate. Additional patchy white matter T2/FLAIR hyperintensity, compatible with chronic microvascular disease. No evidence of acute infarct, hydrocephalus, mass lesion, midline shift, extra-axial fluid collection, or or acute hemorrhage. Vascular: Major arterial flow voids are maintained at the skull base. Skull and upper cervical spine: Normal marrow signal. Sinuses/Orbits: Mild paranasal sinus mucosal thickening. Unremarkable orbits. Other: No mastoid effusions. IMPRESSION: 1. No evidence of acute abnormality on this motion limited study. 2. Similar large remote right MCA territory and small left caudate lacunar infarcts. Electronically Signed   By: Margaretha Sheffield M.D.   On: 07/22/2021 13:02   NM Hepatobiliary Liver Func  Result Date: 07/27/2021 CLINICAL DATA:  Right upper quadrant abdominal pain, with sonographic Murphy sign, gallstones, and gallbladder wall thickening on ultrasound. EXAM: NUCLEAR MEDICINE HEPATOBILIARY IMAGING TECHNIQUE: Sequential images of the abdomen were obtained out to 60 minutes following intravenous administration of radiopharmaceutical. RADIOPHARMACEUTICALS:  5.3 mCi Tc-46m  Choletec IV  COMPARISON:  Abdominal ultrasound 07/27/2021 FINDINGS: There is satisfactory uptake of radiopharmaceutical from the blood pool. Biliary activity is visible 15 minutes. Gallbladder activity visible by 20 minutes. Consecutive images show filling of the gallbladder. Bowel activity is shown by 70 minutes. IMPRESSION: 1. Currently the cystic duct is patent, with gallbladder activity visible 20 minutes after injection. The CBD is also patent, with bowel activity visible at 70 minutes and thereafter. Electronically Signed   By: Van Clines M.D.   On: 07/27/2021 15:07   DG Chest Port 1 View  Result Date: 07/27/2021 CLINICAL DATA:  Fall EXAM: PORTABLE CHEST 1 VIEW COMPARISON:  11/13/2020 FINDINGS: Heart and mediastinal contours are within normal limits. No focal opacities or effusions. No acute bony abnormality. No visible rib fracture or pneumothorax. IMPRESSION: No active disease. Electronically Signed   By: Rolm Baptise M.D.   On: 07/27/2021 03:27   EEG adult  Result Date: 07/27/2021 Greta Doom, MD     07/28/2021  9:13 AM History: 81 yo M being evaluated for encephalopathy Sedation: none Technique: This  EEG was acquired with electrodes placed according to the International 10-20 electrode system (including Fp1, Fp2, F3, F4, C3, C4, P3, P4, O1, O2, T3, T4, T5, T6, A1, A2, Fz, Cz, Pz). The following electrodes were missing or displaced: none. Background: There is a poorly formed and poorly sustained posterior dominant rhythm achieving a maximal frequency of 9-10 Hz. In addition there is diffuse generalized irregular delta and theta range activity seen throughout the recording. Well formed sleep is not recorded. No epileptiform discharges were seen. Photic stimulation: Physiologic driving is not performed EEG Abnormalities: 1) Generalized irregular slow activity. Clinical Interpretation: This EEG is consistent with a generalized non-specific cerebral dysfunction(encephalopathy). There was no seizure  or seizure predisposition recorded on this study. Please note that lack of epileptiform activity on EEG does not preclude the possibility of epilepsy. Roland Rack, MD [facility] [phone] If 7pm- 7am, please page neurology on call as listed in [place].   ECHOCARDIOGRAM COMPLETE  Result Date: 07/27/2021    ECHOCARDIOGRAM REPORT   Patient Name:   RONTAE INGLETT Date of Exam: 07/27/2021 Medical Rec #:  [id number]      Height:       71.0 in Accession #:    [id number]     Weight:       213.0 lb Date of Birth:  12-Oct-1940      BSA:          2.166 m Patient Age:    11 years       BP:           144/75 mmHg Patient Gender: M              HR:           86 bpm. Exam Location:  Inpatient Procedure: 2D Echo, Cardiac Doppler, Color Doppler and Intracardiac            Opacification Agent Indications:     Stroke  History:         Patient has no prior history of Echocardiogram examinations.  Sonographer:     Jyl Heinz Referring Phys:  [id number] RONDELL A [place] Diagnosing Phys: Oswaldo Milian MD IMPRESSIONS  1. Left ventricular ejection fraction, by estimation, is 60 to 65%. The left ventricle has normal function. The left ventricle has no regional wall motion abnormalities. There is mild left ventricular hypertrophy. Left ventricular diastolic parameters are consistent with Grade I diastolic dysfunction (impaired relaxation).  2. Right ventricular systolic function is normal. The right ventricular size is normal. Tricuspid regurgitation signal is inadequate for assessing PA pressure.  3. The mitral valve is grossly normal. No evidence of mitral valve regurgitation. No evidence of mitral stenosis.  4. The aortic valve was not well visualized. Aortic valve regurgitation is not visualized. No aortic stenosis is present. FINDINGS  Left Ventricle: Left ventricular ejection fraction, by estimation, is 60 to 65%. The left ventricle has normal function. The left ventricle has no regional wall motion  abnormalities. The left ventricular internal cavity size was normal in size. There is  mild left ventricular hypertrophy. Left ventricular diastolic parameters are consistent with Grade I diastolic dysfunction (impaired relaxation). Right Ventricle: The right ventricular size is normal. Right vetricular wall thickness was not well visualized. Right ventricular systolic function is normal. Tricuspid regurgitation signal is inadequate for assessing PA pressure. Left Atrium: Left atrial size was normal in size. Right Atrium: Right atrial size was normal in size. Pericardium: There is no evidence of pericardial effusion. Presence of epicardial fat  layer. Mitral Valve: The mitral valve is grossly normal. No evidence of mitral valve regurgitation. No evidence of mitral valve stenosis. Tricuspid Valve: The tricuspid valve is normal in structure. Tricuspid valve regurgitation is trivial. Aortic Valve: The aortic valve was not well visualized. Aortic valve regurgitation is not visualized. No aortic stenosis is present. Aortic valve peak gradient measures 11.8 mmHg. Pulmonic Valve: The pulmonic valve was not well visualized. Pulmonic valve regurgitation is not visualized. Aorta: The aortic root and ascending aorta are structurally normal, with no evidence of dilitation. IAS/Shunts: The interatrial septum was not well visualized.  LEFT VENTRICLE PLAX 2D LVIDd:         4.20 cm      Diastology LVIDs:         2.80 cm      LV e' medial:    4.79 cm/s LV PW:         1.40 cm      LV E/e' medial:  11.0 LV IVS:        1.20 cm      LV e' lateral:   7.07 cm/s LVOT diam:     2.00 cm      LV E/e' lateral: 7.4 LV SV:         60 LV SV Index:   28 LVOT Area:     3.14 cm  LV Volumes (MOD) LV vol d, MOD A2C: 105.0 ml LV vol d, MOD A4C: 118.0 ml LV vol s, MOD A2C: 36.7 ml LV vol s, MOD A4C: 42.4 ml LV SV MOD A2C:     68.3 ml LV SV MOD A4C:     118.0 ml LV SV MOD BP:      74.1 ml RIGHT VENTRICLE RV Basal diam:  3.50 cm RV Mid diam:    2.70 cm RV  S prime:     14.50 cm/s TAPSE (M-mode): 2.2 cm LEFT ATRIUM             Index        RIGHT ATRIUM           Index LA diam:        3.40 cm 1.57 cm/m   RA Area:     15.00 cm LA Vol (A2C):   42.5 ml 19.62 ml/m  RA Volume:   36.30 ml  16.76 ml/m LA Vol (A4C):   66.2 ml 30.57 ml/m LA Biplane Vol: 55.0 ml 25.40 ml/m  AORTIC VALVE AV Area (Vmax): 2.03 cm AV Vmax:        172.00 cm/s AV Peak Grad:   11.8 mmHg LVOT Vmax:      111.00 cm/s LVOT Vmean:     73.500 cm/s LVOT VTI:       0.190 m  AORTA Ao Root diam: 3.80 cm Ao Asc diam:  3.30 cm MITRAL VALVE MV Area (PHT): 4.44 cm    SHUNTS MV Decel Time: 171 msec    Systemic VTI:  0.19 m MV E velocity: 52.50 cm/s  Systemic Diam: 2.00 cm MV A velocity: 75.50 cm/s MV E/A ratio:  0.70 Oswaldo Milian MD Electronically signed by Oswaldo Milian MD Signature Date/Time: 07/27/2021/3:59:23 PM    Final (Updated)    VAS US CAROTID  Result Date: 07/30/2021 Carotid Arterial Duplex Study Patient Name:  BRAELEN SPROULE  Date of Exam:   07/27/2021 Medical Rec #: [id number]       Accession #:    [id number] Date of Birth: 11/30/1940  Patient Gender: M Patient Age:   51 years Exam Location:  [facility] Procedure:      VAS US CAROTID Referring Phys: Joseph Berkshire --------------------------------------------------------------------------------  Indications:       CVA, Carotid artery disease and bilateral endarterectomies                    (right 2006, left 2010). Risk Factors:      Hypertension, past history of smoking, prior CVA. Other Factors:     History of bladder cancer. Comparison Study:  Prior Carotid duplex done 01/30/20 indicated 1-39% ICA                    stenosis bilaterally and patent CEAs. Performing Technologist: Sharion Dove RVS  Examination Guidelines: A complete evaluation includes B-mode imaging, spectral Doppler, color Doppler, and power Doppler as needed of all accessible portions of each vessel. Bilateral testing is considered an integral  part of a complete examination. Limited examinations for reoccurring indications may be performed as noted.  Right Carotid Findings: +----------+--------+--------+--------+------------------+--------+             PSV cm/s EDV cm/s Stenosis Plaque Description Comments  +----------+--------+--------+--------+------------------+--------+  CCA Prox   78       16                                             +----------+--------+--------+--------+------------------+--------+  CCA Distal 94       19                                             +----------+--------+--------+--------+------------------+--------+  ICA Prox   44       11       1-39%    heterogenous                 +----------+--------+--------+--------+------------------+--------+  ICA Distal 67       20                                             +----------+--------+--------+--------+------------------+--------+  ECA        97       22                                             +----------+--------+--------+--------+------------------+--------+ +----------+--------+-------+--------+-------------------+             PSV cm/s EDV cms Describe Arm Pressure (mmHG)  +----------+--------+-------+--------+-------------------+  Subclavian 167                                            +----------+--------+-------+--------+-------------------+ +---------+--------+--+--------+--+  Vertebral PSV cm/s 67 EDV cm/s 20  +---------+--------+--+--------+--+  Left Carotid Findings: +----------+--------+--------+--------+------------------+------------------+             PSV cm/s EDV cm/s Stenosis Plaque Description Comments            +----------+--------+--------+--------+------------------+------------------+  CCA Prox  148      18                                   intimal thickening  +----------+--------+--------+--------+------------------+------------------+  CCA Distal 108      14                                   intimal thickening   +----------+--------+--------+--------+------------------+------------------+  ICA Prox   48       14       1-39%    heterogenous                           +----------+--------+--------+--------+------------------+------------------+  ICA Distal 92       25                                                       +----------+--------+--------+--------+------------------+------------------+  ECA        123      21                                                       +----------+--------+--------+--------+------------------+------------------+ +----------+--------+--------+--------+-------------------+             PSV cm/s EDV cm/s Describe Arm Pressure (mmHG)  +----------+--------+--------+--------+-------------------+  Subclavian 93                                              +----------+--------+--------+--------+-------------------+ +---------+--------+--+--------+--+  Vertebral PSV cm/s 62 EDV cm/s 17  +---------+--------+--+--------+--+   Summary: Right Carotid: Velocities in the right ICA are consistent with a 1-39% stenosis. Left Carotid: There is no evidence of stenosis in the left ICA. Vertebrals:  Bilateral vertebral arteries demonstrate antegrade flow. Subclavians: Normal flow hemodynamics were seen in bilateral subclavian              arteries. *See table(s) above for measurements and observations.  Electronically signed by Antony Contras MD on 07/30/2021 at 11:56:02 AM.    Final    VAS [place] TRANSCRANIAL DOPPLER  Result Date: 07/30/2021  Transcranial Doppler Patient Name:  LYNN SISSEL  Date of Exam:   07/27/2021 Medical Rec #: [id number]       Accession #:    [id number] Date of Birth: 03/03/1941       Patient Gender: M Patient Age:   74 years Exam Location:  [facility] Procedure:      VAS [place] TRANSCRANIAL DOPPLER Referring Phys: PRAMOD SETHI --------------------------------------------------------------------------------  Indications: Stroke. Comparison Study: No prior study Performing Technologist:  Sharion Dove RVS  Examination Guidelines: A complete evaluation includes B-mode imaging, spectral Doppler, color Doppler, and power Doppler as needed of all accessible portions of each vessel. Bilateral testing is considered an integral part of a complete examination. Limited examinations for reoccurring indications may be performed as noted.  +----------+-------------+----------+-----------+-------+  RIGHT TCD  Right VM (cm) Depth (  cm) Pulsatility Comment  +----------+-------------+----------+-----------+-------+  MCA            51.00                   1.29              +----------+-------------+----------+-----------+-------+  ACA           -36.00                   1.20              +----------+-------------+----------+-----------+-------+  Term ICA       51.00                   1.28              +----------+-------------+----------+-----------+-------+  PCA            25.00                   1.14              +----------+-------------+----------+-----------+-------+  Opthalmic      30.00                   1.30              +----------+-------------+----------+-----------+-------+  ICA siphon     22.00                   1.22              +----------+-------------+----------+-----------+-------+  Vertebral      36.00                   1.00              +----------+-------------+----------+-----------+-------+  +----------+------------+----------+-----------+------------------+  LEFT TCD   Left VM (cm) Depth (cm) Pulsatility      Comment        +----------+------------+----------+-----------+------------------+  MCA           101.00       4.30       1.56                         +----------+------------+----------+-----------+------------------+  ACA                                            Unable to insonate  +----------+------------+----------+-----------+------------------+  Term ICA      55.00                   1.51                         +----------+------------+----------+-----------+------------------+   PCA           34.00                   1.16                         +----------+------------+----------+-----------+------------------+  Opthalmic     25.00                   1.26                         +----------+------------+----------+-----------+------------------+  ICA siphon    23.00                   1.58                         +----------+------------+----------+-----------+------------------+  Vertebral     -21.00                  1.52                         +----------+------------+----------+-----------+------------------+  +------------+------+-------+               VM cm  Comment  +------------+------+-------+  Prox Basilar -27.00          +------------+------+-------+ Summary:  Elevated left middle cerebral artery mean flow velocities suggest mild stenosis. 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 *See table(s) above for TCD measurements and observations.  Diagnosing physician: Antony Contras MD Electronically signed by Antony Contras MD on 07/30/2021 at 11:58:01 AM.    Final    US ABDOMEN LIMITED RUQ (LIVER/GB)  Result Date: 07/27/2021 CLINICAL DATA:  Abdominal pain with elevated LFTs. EXAM: ULTRASOUND ABDOMEN LIMITED RIGHT UPPER QUADRANT COMPARISON:  CT without contrast 04/28/2021 FINDINGS: Gallbladder: The gallbladder is filled with stones, largest measurable stone approaching 1.5 cm. There is a thickened anterior free wall measuring 5 mm with a positive sonographic Murphy sign. This is concerning for acute cholecystitis. Common bile duct: Diameter: Unable to visualize. Liver: No focal lesion identified. There is increased hepatic echogenicity consistent with steatosis. Portal vein is patent on color Doppler imaging with normal direction of blood flow towards the liver. Other: 5 cm simple appearing cyst again noted in the upper pole right kidney. No free fluid is evident. IMPRESSION: 1. Mild thickening of the gallbladder with positive sonographic  Murphy sign and multiple stones. Findings concerning for acute cholecystitis. No pericholecystic fluid. 2. Fatty liver. 3. Unable to visualize the bile ducts due to shadowing by the gallbladder and due to bowel gas. 4. 5 cm simple cyst right kidney. Electronically Signed   By: Telford Nab M.D.   On: 07/27/2021 06:10    Labs:  Basic Metabolic Panel: Recent Labs  Lab 08/12/21 0629 08/14/21 1210  NA 138 135  K 4.1 4.4  CL 109 105  CO2 20* 22  GLUCOSE 103* 106*  BUN 18 17  CREATININE 1.97* 1.86*  CALCIUM 8.8* 9.3    CBC: Recent Labs  Lab 08/12/21 0629  WBC 6.1  HGB 9.2*  HCT 28.3*  MCV 99.0  PLT 258    CBG: Recent Labs  Lab 08/17/21 0608 08/17/21 1130 08/17/21 1647 08/17/21 2117 08/18/21 0712  GLUCAP 96 117* 107* 144* 101*    Brief HPI:   William [name] is a 81 y.o. male with a history of previous left MCA infarction and mild neurocognitive disorder who developed altered mental status on 2/8.  He was evaluated emergency department with negative imaging results.  There was concern for UTI and he was started on cefdinir and discharged home.  The following day he presented to [facility] with worsening confusion and slurred speech.  CT of the head was significant for acute perforator infarct at the left basal ganglia and corona radiata.   Hospital Course: SHELBY ANDERLE was admitted to rehab 07/29/2021 for inpatient therapies to consist of PT, ST and OT at least three hours five days a  week. Past admission physiatrist, therapy team and rehab RN have worked together to provide customized collaborative inpatient rehab. Patien exhibited insomnia and given trazadone and sleep chart started. Also exhibited impulsivity overnight 2/17.  His urinalysis was positive and Keflex 500 mg every 12 hours was started on 217.  Follow-up labs revealed borderline hypokalemia and given 20 milliequivalent supplementation.  Follow-up CBC revealed upward trend in H&H.  He remained  incontinent of urine but did not require in and out catheterizations. On 2/17, he was at the sind with OT when he felt light headed and sank to the floor. No injury or seizure-type activity. He did not loss consciousness. Rapid  response called. Systolic BP 878M. EKG>>NSR with rate of 78. No nausea or vomiting. Recovered with only complaint of left rib pain from previous fractures.  No neurologic changes.  CTH performed which showed evolving left basal ganglia/corona radiata infarct and punctate hemorrhagic conversion.  Neurology consulted.  Recommended orthostatic vital signs, TED hose and no change in antiplatelet regimen.  His Lovenox was discontinued.  On 2/20, his right upper extremity tremor was consistent with pill-rolling and demonstrated shuffle type gait.  Neurology reconsulted 2/20 to consider parkinsonism vascular parkinsonism versus essential tremor.  Less likely to be idiopathic parkinsonism given the patient did not have a resting tremor.  Recommend outpatient neurology follow-up.  No antiparkinson medication at this time. AMS is likely multifactorial with history of dementia, on chronic benzodiazepines with bilateral infarcts. Patient tolerating solid foods but coughing somewhat with thin liquids. Bedside speech eval obtained. Amantadine started for decreased initiation and Trazodone increased to 75 mg for insomnia. Bladder program for urinary incontinence.  On 2/27 more frequent urination was noted along with incontinence.  Foul odor to urine.  Urinalysis and urine culture obtained.  Initial results revealed multiple species, therefore recollection of urine was obtained on 2/28.  Seemed to be doing better on amantadine. Tremor somewhat worse on 3/1  and with urinary incontinence, started empiric Keflex for 3/1 for a total of seven days.  Blood pressures were monitored on TID basis and somewhat elevated but stable on amlodipine and hydralazine. BP remained in fair control.  Pre-diabetes has been  monitored with ac/hs CBG checks and did not require coverage.  Rehab course: During patient's stay in rehab weekly team conferences were held to monitor patient's progress, set goals and discuss barriers to discharge. At admission, patient required mod A for mobility and mod to max a with ADLs.  He has had improvement in activity tolerance, balance, postural control as well as ability to compensate for deficits. He has had improvement in functional use RUE/LUE  and RLE/LLE as well as improvement in awareness. Gait training with RW on level tile x 150' including L/R turns, with CGa except for 1 LOB when R side of RW lifted off the ground, requiring mod assist to recover. CGA of 2 for sit<stand and side-stepping up towards [place]. 2 assist to return to bed safely. Using weighted utensils for meals.  Disposition:  Discharge disposition: 01-Home or Self Care     Diet: heart healthy  Special Instructions:  Stop aspirin therapy on Oct 24, 2021 and continue Plavix per neurology.  Unable to get pre-authorization for lidocaine patches. Has been denied twice in the past; 11/2020, 12/2020.  No driving, alcohol consumption or tobacco use.   30-35 minutes were spent on discharge planning and discharge summary.  Discharge Instructions     Ambulatory referral to Neurology   Complete by: As directed  Follow up with Dr. Tomi Likens at [facility] in 4-6 weeks. Pt is Dr. Georgie Chard pt. Thanks.      Allergies as of 08/18/2021       Reactions   Bee Venom Anaphylaxis   Strawberry Extract Hives   Latex Itching   Adhesive [tape] Other (See Comments)   BLISTER   Statins Other (See Comments)   myalgias        Medication List     STOP taking these medications    ALKA-SELTZER PO   Eszopiclone 3 MG Tabs   hydrocortisone valerate cream 0.2 % Commonly known as: WESTCORT   ondansetron 4 MG disintegrating tablet Commonly known as: Zofran ODT   oxyCODONE-acetaminophen 7.5-325 MG tablet Commonly known as:  Percocet   polyethylene glycol 17 g packet Commonly known as: MiraLax   sildenafil 100 MG tablet Commonly known as: VIAGRA       TAKE these medications    acetaminophen 325 MG tablet Commonly known as: TYLENOL Take 1-2 tablets (325-650 mg total) by mouth every 4 (four) hours as needed for mild pain. What changed:  medication strength how much to take when to take this reasons to take this   Adult One Daily Gummies Chew Chew 1 tablet by mouth every morning.   amantadine 100 MG capsule Commonly known as: SYMMETREL Take 1 capsule (100 mg total) by mouth daily.   amLODipine 10 MG tablet Commonly known as: NORVASC Take 1 tablet (10 mg total) by mouth every morning.   Aspirin Low Dose 81 MG EC tablet Generic drug: aspirin Take 1 tablet (81 mg total) by mouth daily. Swallow whole. Notes to patient: Discontinue this medication after Oct 24, 2021   carbamazepine 200 MG 12 hr tablet Commonly known as: TEGRETOL XR Take 1 tablet (200 mg total) by mouth at bedtime. What changed: You were already taking a medication with the same name, and this prescription was added. Make sure you understand how and when to take each.   carbamazepine 100 MG 12 hr tablet Commonly known as: TEGRETOL XR Take 1 tablet (100 mg total) by mouth in the morning. What changed:  how much to take how to take this when to take this   clopidogrel 75 MG tablet Commonly known as: PLAVIX Take 1 tablet (75 mg total) by mouth daily.   docusate sodium 100 MG capsule Commonly known as: COLACE Take 1 capsule (100 mg total) by mouth daily.   fenofibrate 160 MG tablet Take 1 tablet (160 mg total) by mouth at bedtime.   Flaxseed Oil 1200 MG Caps Take 1,200 mg by mouth 2 (two) times daily.   fluticasone 0.05 % cream Commonly known as: CUTIVATE 1 application daily as needed (psoriasis).   hydrALAZINE 25 MG tablet Commonly known as: APRESOLINE Take 1 tablet (25 mg total) by mouth every 8 (eight)  hours.   lidocaine 5 % Commonly known as: LIDODERM Place 1 patch onto the skin daily. Remove & Discard patch within 12 hours or as directed by MD What changed: when to take this   LORazepam 1 MG tablet Commonly known as: ATIVAN Take 1 tablet (1 mg total) by mouth at bedtime. What changed: Another medication with the same name was changed. Make sure you understand how and when to take each.   LORazepam 0.5 MG tablet Commonly known as: ATIVAN Take 1/2 tablet (0.25 mg total) by mouth daily. What changed: how much to take   pantoprazole 40 MG tablet Commonly known as: Protonix Take 1 tablet (40  mg total) by mouth daily.   tamsulosin 0.4 MG Caps capsule Commonly known as: FLOMAX Take 1 capsule (0.4 mg total) by mouth daily after supper. What changed: when to take this   traMADol 50 MG tablet Commonly known as: ULTRAM Take 1 tablet (50 mg total) by mouth every 12 (twelve) hours as needed (back pain).   traZODone 150 MG tablet Commonly known as: DESYREL Take 1/2 tablet (75 mg total) by mouth at bedtime.   triamcinolone cream 0.1 % Commonly known as: KENALOG Apply 1 application topically 2 (two) times daily. What changed:  when to take this reasons to take this   Vitamin D 50 MCG (2000 UT) Caps Take 2,000 Units by mouth every morning.        Follow-up Information     Raulkar, Clide Deutscher, MD Follow up.   Specialty: Physical Medicine and Rehabilitation Why: 01/04/22 please arrive at 9:00am for 9:20am appointment, thank you Contact information: [address] [place] [address] [phone]         Pieter Partridge, DO. Schedule an appointment as soon as possible for a visit in 1 month(s).   Specialty: Neurology Contact information: [address] [place] [address] [phone]         Ann Held, DO. Call.   Specialty: Family Medicine Why: Call on Wednesday for hospital follow-up appointment Contact  information: [address] [place] [address] [phone]                 Signed: Barbie Banner 08/18/2021, 1:01 PM

## 2021-07-31 NOTE — Consult Note (Signed)
Stroke Neurology Consultation Note  Consult Requested by: Dr. Letta Pate  Reason for Consult: Hemorrhagic transformation  Consult Date: 07/31/21   The history was obtained from the Dr. Letta Pate, wife and son.  During history and examination, all items were able to obtain unless otherwise noted.  History of Present Illness:  William [name] is a 80 y.o. Caucasian male with PMH of right MCA stroke in 2006, carotid stenosis status post bilateral CEA 1 in 2006 and the second one several years later, CKD 3, hypertension and hyperlipidemia admitted on 07/27/2021 for lethargy, nausea vomiting and acute right-sided weakness.  MRI found to have left BG and CR infarct.  MRA head showed left M1 high-grade stenosis and moderate right ICA stenosis.  Carotid Doppler unremarkable.  EF 60 to 65%.  EEG no seizure.  LDL 115, A1c 5.3.  Discharged to CIR on 07/29/2021 with DAPT for 3 months and fenofibrate given statin allergy.  Patient has been working with PT/OT, doing well.  Today, per wife, he was standing beside sink working with OT, feeding lightheaded and dizzy, and lowered himself to the floor in a controlled fashion with OT assistance.  Did not hit head.  BP 140s while he was on the ground.  He was put back in bed and wife felt his speech a little bit more slurry than normal.  CT stat done showed evolving left BG/CR infarct and punctate hemorrhagic conversion.  Patient neuro stable per CIR PA and neurology consulted.   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   Mohs surgery jan 2016    Bladder stone    BPH (benign prostatic hyperplasia) 08/06/2007   Carotid artery occlusion    Chronic kidney disease 2014   Stage III   Closed fracture of fifth metacarpal bone 05/15/2015   Eczema    Fasting hyperglycemia 12/21/2006   GERD (gastroesophageal reflux disease)    History of carotid artery stenosis    S/P BILATERAL CEA   History of right MCA  infarct 06/14/2004   HTN (hypertension) 07/19/2015   Hyperlipidemia    Hypertension    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   Mild, related to stroke history   Nocturia    Renal insufficiency 06/25/2013   S/P carotid endarterectomy    BILATERAL ICA--  PATENT PER DUPLEX  05-19-2012   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   Right calf   Urinary frequency    Vitamin D deficiency     Past Surgical History:  Procedure Laterality Date   APPENDECTOMY  AS CHILD   CARDIOVASCULAR STRESS TEST  03-27-2012  DR CRENSHAW   LOW RISK LEXISCAN STUDY-- PROBABLE NORMAL PERFUSION AND SOFT TISSUE ATTENUATION/  NO ISCHEMIA/ EF 51%   CAROTID ENDARTERECTOMY Bilateral LEFT  11-12-2008  DR GREG HAYES   RIGHT ICA  2006  ([facility])   CYSTOSCOPY W/ RETROGRADES Bilateral 06/22/2021   Procedure: CYSTOSCOPY WITH RETROGRADE PYELOGRAM;  Surgeon: Franchot Gallo, MD;  Location: [facility];  Service: Urology;  Laterality: Bilateral;   CYSTOSCOPY WITH LITHOLAPAXY N/A 02/26/2013   Procedure: CYSTOSCOPY WITH LITHOLAPAXY;  Surgeon: Franchot Gallo, MD;  Location: [facility];  Service: Urology;  Laterality: N/A;   EYE SURGERY  Jan. 2016   cataract surgery both eyes   INGUINAL HERNIA REPAIR Right 11-08-2006   IR KYPHO EA ADDL LEVEL THORACIC OR LUMBAR  02/12/2021   IR  RADIOLOGIST EVAL & MGMT  02/18/2021   MASS EXCISION N/A 03/03/2016   Procedure: EXCISION OF BACK  MASS;  Surgeon: Stark Klein, MD;  Location: [place];  Service: General;  Laterality: N/A;   MOHS SURGERY Left 1/ 2016   Dr Nevada Crane-- Basal cell   PROSTATE SURGERY     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-C N/A 06/22/2021   Procedure: TRANSURETHRAL RESECTION OF BLADDER TUMOR;  Surgeon: Franchot Gallo, MD;  Location: [facility];  Service: Urology;  Laterality: N/A;   TRANSURETHRAL RESECTION OF PROSTATE N/A 02/26/2013   Procedure:  TRANSURETHRAL RESECTION OF THE PROSTATE WITH GYRUS INSTRUMENTS;  Surgeon: Franchot Gallo, MD;  Location: [facility];  Service: Urology;  Laterality: N/A;   TRANSURETHRAL RESECTION OF PROSTATE N/A 06/22/2021   Procedure: TRANSURETHRAL RESECTION OF THE PROSTATE (TURP);  Surgeon: Franchot Gallo, MD;  Location: [facility];  Service: Urology;  Laterality: N/A;    Family History  Problem Relation Age of Onset   Heart disease Mother        CHF   Bipolar disorder Mother    Heart disease Father        CHF    Social History:  reports that he quit smoking about 16 years ago. His smoking use included cigarettes. He has a 80.00 pack-year smoking history. He has never used smokeless tobacco. He reports current alcohol use. He reports that he does not use drugs.  Allergies:  Allergies  Allergen Reactions   Bee Venom Anaphylaxis   Strawberry Extract Hives   Latex Itching   Adhesive [Tape] Other (See Comments)    BLISTER   Statins Other (See Comments)    myalgias    Current Facility-Administered Medications on File Prior to Encounter  Medication Dose Route Frequency Provider Last Rate Last Admin   gemcitabine (GEMZAR) chemo syringe for bladder instillation 2,000 mg  2,000 mg Bladder Instillation Once Franchot Gallo, MD       Current Outpatient Medications on File Prior to Encounter  Medication Sig Dispense Refill   acetaminophen (TYLENOL) 500 MG tablet Take 500 mg by mouth every 6 (six) hours as needed for headache (pain).     amLODipine (NORVASC) 10 MG tablet Take 1 tablet by mouth daily (Patient taking differently: Take 10 mg by mouth every morning.) 90 tablet 3   aspirin EC 81 MG EC tablet Take 1 tablet (81 mg total) by mouth daily. Swallow whole. 30 tablet 2   Aspirin Effervescent (ALKA-SELTZER PO) Take 2 tablets by mouth daily as needed (indigestion/heartburn). chewable     carbamazepine (TEGRETOL XR) 100 MG 12 hr tablet TAKE 1 TABLET BY MOUTH EVERY  MORNING AND 2 AT BEDTIME (Patient taking differently: 100-200 mg See admin instructions. Take one tablet (100 mg) by mouth every morning and two tablets (200 mg) at bedtime) 270 tablet 1   Cholecalciferol (VITAMIN D) 50 MCG (2000 UT) CAPS Take 2,000 Units by mouth every morning.     clopidogrel (PLAVIX) 75 MG tablet Take 1 tablet (75 mg total) by mouth daily.     Eszopiclone 3 MG TABS TAKE 1 TABLET BY MOUTH IMMEDIATELY BEFORE BEDTIME AS NEEDED (Patient taking differently: 3 mg at bedtime.) 90 tablet 2   fenofibrate 160 MG tablet Take 1 tablet (160 mg total) by mouth daily. (Patient taking differently: Take 160 mg by mouth at bedtime.) 90 tablet 1   Flaxseed, Linseed, (FLAXSEED OIL) 1200 MG CAPS Take 1,200 mg by mouth 2 (two) times  daily.     fluticasone (CUTIVATE) 5.32 % cream 1 application daily as needed (psoriasis).  3   hydrALAZINE (APRESOLINE) 25 MG tablet Take 1 tablet (25 mg total) by mouth every 8 (eight) hours.     hydrocortisone valerate cream (WESTCORT) 0.2 % Apply topically daily as needed (to areas of psoriasis). 45 g 0   lidocaine (LIDODERM) 5 % Place 1 patch onto the skin every 12 (twelve) hours. Remove & Discard patch within 12 hours or as directed by MD 10 patch 0   LORazepam (ATIVAN) 0.5 MG tablet Take 1 tablet (0.5 mg total) by mouth daily. 30 tablet 0   LORazepam (ATIVAN) 1 MG tablet Take 1 tablet (1 mg total) by mouth at bedtime. 30 tablet 0   Multiple Vitamins-Minerals (ADULT ONE DAILY GUMMIES) CHEW Chew 1 tablet by mouth every morning.     ondansetron (ZOFRAN ODT) 4 MG disintegrating tablet Take 1 tablet (4 mg total) by mouth every 8 (eight) hours as needed for nausea or vomiting. (Patient not taking: Reported on 07/27/2021) 20 tablet 0   oxyCODONE-acetaminophen (PERCOCET) 7.5-325 MG tablet Take 1 tablet by mouth every 6 (six) hours as needed for severe pain. 20 tablet 0   pantoprazole (PROTONIX) 40 MG tablet Take 1 tablet (40 mg total) by mouth daily. 30 tablet 1   polyethylene  glycol (MIRALAX) 17 g packet Take 17 g by mouth daily. 14 each 0   sildenafil (VIAGRA) 100 MG tablet Take 1 tablet (100 mg total) by mouth as needed up to once daily. (Patient not taking: Reported on 07/27/2021) 10 tablet 11   tamsulosin (FLOMAX) 0.4 MG CAPS capsule Take 1 capsule (0.4 mg total) by mouth 2 (two) times daily. (Patient taking differently: Take 0.4 mg by mouth at bedtime.) 60 capsule 11   triamcinolone cream (KENALOG) 0.1 % Apply 1 application topically 2 (two) times daily. (Patient taking differently: Apply 1 application topically 2 (two) times daily as needed (psoriasis).) 30 g 0    Review of Systems: A full ROS was attempted today and was able to be performed.  Systems assessed include - Constitutional, Eyes, HENT, Respiratory, Cardiovascular, Gastrointestinal, Genitourinary, Integument/breast, Hematologic/lymphatic, Musculoskeletal, Neurological, Behavioral/Psych, Endocrine, Allergic/Immunologic - with pertinent responses as per HPI.  Physical Examination: Temp:  [98 F (36.7 C)-98.4 F (36.9 C)] 98 F (36.7 C) (02/17 0540) Pulse Rate:  [75-78] 75 (02/17 1449) Resp:  [14-18] 14 (02/17 1449) BP: (148-157)/(73-95) 148/91 (02/17 1449) SpO2:  [96 %-100 %] 100 % (02/17 1449)  General - well nourished, well developed, in no apparent distress.    Ophthalmologic - fundi not visualized due to noncooperation.    Cardiovascular - regular rhythm and rate  Neuro - awake, alert, eyes open, orientated to place and people, but not orientated to age or time. No aphasia, paucity of speech with mild dysarthria, following all simple commands. Able to name and repeat. No gaze palsy, tracking bilaterally, visual field full, PERRL.  Chronic left facial droop with mild left proptosis. Tongue midline.  Left upper extremity 3+/5, right upper extremity 4/5, bilateral lower extremity proximal 3/5, distally right 4+/5, left 4/5.  Sensation symmetrical bilaterally, b/l FTN grossly intact however slow on  the left, gait not tested.    Data Reviewed: CT ABDOMEN PELVIS WO CONTRAST  Result Date: 07/29/2021 CLINICAL DATA:  Abdominal pain EXAM: CT ABDOMEN AND PELVIS WITHOUT CONTRAST TECHNIQUE: Multidetector CT imaging of the abdomen and pelvis was performed following the standard protocol without IV contrast. RADIATION DOSE REDUCTION: This exam  was performed according to the departmental dose-optimization program which includes automated exposure control, adjustment of the mA and/or kV according to patient size and/or use of iterative reconstruction technique. COMPARISON:  CT abdomen/pelvis 04/28/2021, right upper quadrant ultrasound 07/27/2021 FINDINGS: Lower chest: There are patchy opacities in the lung bases. Is mild calcification of the aortic valve. Coronary artery calcification is noted in the left anterior descending artery. Hepatobiliary: The liver is unremarkable, within the confines of noncontrast technique. There is hyperdensity within gallbladder likely reflecting a combination of stones and sludge. There is no appreciable gallbladder wall thickening and no significant pericholecystic fluid or inflammatory change. Pancreas: Unremarkable. Spleen: Unremarkable. Adrenals/Urinary Tract: The adrenals are unremarkable. Multiple right renal cysts are noted, the largest measuring up to 5.2 cm. No other focal lesions are seen, within the confines of noncontrast technique. No stones are seen. There is no hydronephrosis or hydroureter. The bladder is unremarkable. Stomach/Bowel: The stomach is unremarkable, allowing for underdistention. There is no evidence of bowel obstruction. There is no abnormal bowel wall thickening or inflammatory change. There is colonic diverticulosis without evidence of acute diverticulitis. The appendix is not definitively identified, but there is no pericecal inflammatory change. Vascular/Lymphatic: Is extensive calcified atherosclerotic plaque throughout the nonaneurysmal abdominal  aorta. There is no abdominal or pelvic lymphadenopathy. 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 The prostate is mildly enlarged, unchanged. The seminal vesicles are unremarkable. Other: There is no ascites or free air. Musculoskeletal: There is a nondisplaced fracture of the left eleventh and twelfth ribs at the costochondral junctions (3-24, 3-30). There is an additional fracture of the eleventh rib laterally (3-38). There are nondisplaced fractures of the left transverse processes at T12 and L1 (6-133, 6-124). These are new since the prior study from 04/28/2021 and may be related to the recent fall. Vertebral body heights are preserved. Post kyphoplasty changes are seen at L1. There are flowing anterior osteophytes in the imaged lower thoracic spine consistent with diffuse idiopathic skeletal hyperostosis. IMPRESSION: 1. Nondisplaced fractures of the left eleventh and twelfth ribs (the twelfth rib is broken in two places), and left transverse processes at T12 through L3, new since the prior study from November 2022 and likely related to the recent fall. 2. Otherwise, no acute findings in the abdomen or pelvis. 3. Cholelithiasis and probable sludge without evidence of acute cholecystitis. 4. Diverticulosis without evidence of acute diverticulitis. 5. Patchy opacities in the lung bases may reflect atelectasis, though infection can not be excluded. Aortic Atherosclerosis (ICD10-I70.0). Electronically Signed   By: Valetta Mole M.D.   On: 07/29/2021 13:32   CT HEAD WO CONTRAST (5MM)  Result Date: 07/31/2021 CLINICAL DATA:  Recent CVA EXAM: CT HEAD WITHOUT CONTRAST TECHNIQUE: Contiguous axial images were obtained from the base of the skull through the vertex without intravenous contrast. RADIATION DOSE REDUCTION: This exam was performed according to the departmental dose-optimization program which includes automated exposure control, adjustment of the mA and/or kV according to patient size and/or use of  iterative reconstruction technique. COMPARISON:  07/27/2021 CT and MRI FINDINGS: Brain: There has been continued evolution of the left basal ganglia and corona radiata infarct seen previously. In the periphery of the infarct bed there is a 5 mm hyperdense focus consistent with punctate hemorrhage. No significant mass effect. Chronic encephalomalacia throughout the right cerebral hemisphere from prior right MCA territory infarct. Lateral ventricles and midline structures are stable. No acute extra-axial fluid collections. No mass effect or midline shift. Vascular: Stable atherosclerosis.  No hyperdense  vessel. Skull: Normal. Negative for fracture or focal lesion. Sinuses/Orbits: Minimal polypoid mucosal thickening right maxillary sinus. Remaining sinuses are clear. Other: None. IMPRESSION: 1. Continued evolution of the left basal ganglia and corona radiata infarct seen previously. New small 5 mm focal hemorrhage within the periphery of the infarct bed. 2. No mass effect or midline shift. 3. Large remote previous right MCA territory infarct and resulting encephalomalacia. Critical Value/emergent results were called by telephone at the time of interpretation on 07/31/2021 at 4:04 pm to provider [facility] , who verbally acknowledged these results. Electronically Signed   By: Randa Ngo M.D.   On: 07/31/2021 16:10   CT HEAD WO CONTRAST (5MM)  Result Date: 07/27/2021 CLINICAL DATA:  Minor head trauma.  Vomiting and syncope EXAM: CT HEAD WITHOUT CONTRAST CT CERVICAL SPINE WITHOUT CONTRAST TECHNIQUE: Multidetector CT imaging of the head and cervical spine was performed following the standard protocol without intravenous contrast. Multiplanar CT image reconstructions of the cervical spine were also generated. RADIATION DOSE REDUCTION: This exam was performed according to the departmental dose-optimization program which includes automated exposure control, adjustment of the mA and/or kV according to patient size  and/or use of iterative reconstruction technique. COMPARISON:  Brain MRI from 5 days ago FINDINGS: CT HEAD FINDINGS Brain: Interval and acute appearing infarct at the left stratum and corona radiata. Large remote right MCA territory infarct affecting the upper division. Brain atrophy. No acute hemorrhage or hydrocephalus Vascular: No hyperdense vessel or unexpected calcification. Skull: Normal. Negative for fracture or focal lesion. Sinuses/Orbits: No acute finding. CT CERVICAL SPINE FINDINGS Alignment: No traumatic malalignment Skull base and vertebrae: No acute fracture Soft tissues and spinal canal: No prevertebral fluid or swelling. No visible canal hematoma. Disc levels:  Ordinary degenerative changes. 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 These results were called by telephone at the time of interpretation on 07/27/2021 at 4:17 am to provider [facility] , who verbally acknowledged these results. IMPRESSION: 1. 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 2. No posttraumatic intracranial or cervical spine finding. 3. Remote right MCA upper division infarcts. Electronically Signed   By: Jorje Guild M.D.   On: 07/27/2021 04:17   CT HEAD WO CONTRAST  Result Date: 07/22/2021 CLINICAL DATA:  81 year old male with history of altered mental status. EXAM: CT HEAD WITHOUT CONTRAST TECHNIQUE: Contiguous axial images were obtained from the base of the skull through the vertex without intravenous contrast. RADIATION DOSE REDUCTION: This exam was performed according to the departmental dose-optimization program which includes automated exposure control, adjustment of the mA and/or kV according to patient size and/or use of iterative reconstruction technique. COMPARISON:  Head CT 11/13/2020. FINDINGS: Brain: Mild cerebral atrophy. Again noted is a large area of low attenuation throughout the right cerebral hemisphere, compatible with extensive  encephalomalacia related to remote right MCA territory infarct. Well-defined foci of low attenuation also noted in the left external capsule, and in the head of the right caudate nucleus, compatible with old lacunar infarcts. No evidence of acute infarction, hemorrhage, hydrocephalus, extra-axial collection or mass lesion/mass effect. Vascular: No hyperdense vessel or unexpected calcification. Skull: Normal. Negative for fracture or focal lesion. Sinuses/Orbits: No acute finding. Other: None. IMPRESSION: 1. No acute intracranial abnormalities. 2. 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 3. Mild cerebral atrophy. Electronically Signed   By: Mauri Brooklyn.D.  On: 07/22/2021 06:58   CT CERVICAL SPINE WO CONTRAST  Result Date: 07/27/2021 CLINICAL DATA:  Minor head trauma.  Vomiting and syncope EXAM: CT HEAD WITHOUT CONTRAST CT CERVICAL SPINE WITHOUT CONTRAST TECHNIQUE: Multidetector CT imaging of the head and cervical spine was performed following the standard protocol without intravenous contrast. Multiplanar CT image reconstructions of the cervical spine were also generated. RADIATION DOSE REDUCTION: This exam was performed according to the departmental dose-optimization program which includes automated exposure control, adjustment of the mA and/or kV according to patient size and/or use of iterative reconstruction technique. COMPARISON:  Brain MRI from 5 days ago FINDINGS: CT HEAD FINDINGS Brain: Interval and acute appearing infarct at the left stratum and corona radiata. Large remote right MCA territory infarct affecting the upper division. Brain atrophy. No acute hemorrhage or hydrocephalus Vascular: No hyperdense vessel or unexpected calcification. Skull: Normal. Negative for fracture or focal lesion. Sinuses/Orbits: No acute finding. CT CERVICAL SPINE FINDINGS Alignment: No traumatic malalignment  Skull base and vertebrae: No acute fracture Soft tissues and spinal canal: No prevertebral fluid or swelling. No visible canal hematoma. Disc levels:  Ordinary degenerative changes. 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 These results were called by telephone at the time of interpretation on 07/27/2021 at 4:17 am to provider [facility] , who verbally acknowledged these results. IMPRESSION: 1. 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 2. No posttraumatic intracranial or cervical spine finding. 3. Remote right MCA upper division infarcts. Electronically Signed   By: Jorje Guild M.D.   On: 07/27/2021 04:17   MR ANGIO HEAD WO CONTRAST  Result Date: 07/27/2021 CLINICAL DATA:  Neuro deficit, acute, stroke suspected EXAM: MRI HEAD WITHOUT CONTRAST MRA HEAD WITHOUT CONTRAST TECHNIQUE: Multiplanar, multi-echo pulse sequences of the brain and surrounding structures were acquired without intravenous contrast. Angiographic images of the Circle of Willis were acquired using MRA technique without intravenous contrast. COMPARISON:  CT head from the same day. MRA August 07, 2005 without report. 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 Associated edema without mass effect. Large remote right MCA territory infarct with encephalomalacia and surrounding gliosis. Evidence of some prior hemorrhage along the posterior aspect of this remote infarct. No evidence of acute hemorrhage. Vascular: See below. Skull and upper cervical spine: Normal marrow signal. Sinuses/Orbits: Mild paranasal sinus mucosal thickening. Unremarkable orbits. Other: No sizable mastoid effusions. MRA HEAD FINDINGS Anterior circulation: Bilateral intracranial ICAs are patent. Moderate right paraclinoid ICA stenosis. Subtle apparent filling defect of the right paraclinoid ICA in the region of stenosis is  favored to reflect flow artifact given preserved T2 hypointense flow void on MRI. Bilateral MCAs are patent. Severe distal left M1 MCA stenosis. Attenuated distal right MCA vessels in the region of remote infarct. Bilateral ACAs are patent without proximal hemodynamically significant stenosis. Posterior circulation: Only the distal most intradural vertebral arteries are visualized, which are patent. The basilar artery and bilateral posterior cerebral arteries are patent without proximal hemodynamically significant stenosis. Prominent left posterior communicating artery with small or absent left P1 PCA, anatomic variant. Anatomic variants: Detailed above. IMPRESSION: MRI: 1. 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 2. Large remote right MCA territory infarct. MRA: 1. No large vessel occlusion. 2. Severe distal left M1 MCA stenosis. 3. Moderate right paraclinoid ICA stenosis. 4. Attenuated distal right MCA vessels in the region of remote infarct. Electronically Signed   By:  Margaretha Sheffield M.D.   On: 07/27/2021 10:06   MR BRAIN WO CONTRAST  Result Date: 07/27/2021 CLINICAL DATA:  Neuro deficit, acute, stroke suspected EXAM: MRI HEAD WITHOUT CONTRAST MRA HEAD WITHOUT CONTRAST TECHNIQUE: Multiplanar, multi-echo pulse sequences of the brain and surrounding structures were acquired without intravenous contrast. Angiographic images of the Circle of Willis were acquired using MRA technique without intravenous contrast. COMPARISON:  CT head from the same day. MRA August 07, 2005 without report. 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 Associated edema without mass effect. Large remote right MCA territory infarct with encephalomalacia and surrounding gliosis. Evidence of some prior hemorrhage along the posterior aspect of this remote infarct. No evidence of acute hemorrhage. Vascular: See below. Skull and  upper cervical spine: Normal marrow signal. Sinuses/Orbits: Mild paranasal sinus mucosal thickening. Unremarkable orbits. Other: No sizable mastoid effusions. MRA HEAD FINDINGS Anterior circulation: Bilateral intracranial ICAs are patent. Moderate right paraclinoid ICA stenosis. Subtle apparent filling defect of the right paraclinoid ICA in the region of stenosis is favored to reflect flow artifact given preserved T2 hypointense flow void on MRI. Bilateral MCAs are patent. Severe distal left M1 MCA stenosis. Attenuated distal right MCA vessels in the region of remote infarct. Bilateral ACAs are patent without proximal hemodynamically significant stenosis. Posterior circulation: Only the distal most intradural vertebral arteries are visualized, which are patent. The basilar artery and bilateral posterior cerebral arteries are patent without proximal hemodynamically significant stenosis. Prominent left posterior communicating artery with small or absent left P1 PCA, anatomic variant. Anatomic variants: Detailed above. IMPRESSION: MRI: 1. 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 2. Large remote right MCA territory infarct. MRA: 1. No large vessel occlusion. 2. Severe distal left M1 MCA stenosis. 3. Moderate right paraclinoid ICA stenosis. 4. Attenuated distal right MCA vessels in the region of remote infarct. Electronically Signed   By: Margaretha Sheffield M.D.   On: 07/27/2021 10:06   MR BRAIN WO CONTRAST  Result Date: 07/22/2021 CLINICAL DATA:  Neuro deficit, acute, stroke suspected Patient had worsened left-sided facial droop and altered mental status this morning. Now improving. Rule out acute stroke. EXAM: MRI HEAD WITHOUT CONTRAST TECHNIQUE: Multiplanar, multiecho pulse sequences of the brain and surrounding structures were obtained without intravenous contrast. COMPARISON:  Same day CT head.  MRI head August 14, 2020. FINDINGS: Motion limited study. Brain: Similar large  area predominately right MCA territory encephalomalacia. Surrounding gliosis. Similar small remote lacunar infarct in the left caudate. Additional patchy white matter T2/FLAIR hyperintensity, compatible with chronic microvascular disease. No evidence of acute infarct, hydrocephalus, mass lesion, midline shift, extra-axial fluid collection, or or acute hemorrhage. Vascular: Major arterial flow voids are maintained at the skull base. Skull and upper cervical spine: Normal marrow signal. Sinuses/Orbits: Mild paranasal sinus mucosal thickening. Unremarkable orbits. Other: No mastoid effusions. IMPRESSION: 1. No evidence of acute abnormality on this motion limited study. 2. Similar large remote right MCA territory and small left caudate lacunar infarcts. Electronically Signed   By: Margaretha Sheffield M.D.   On: 07/22/2021 13:02   NM Hepatobiliary Liver Func  Result Date: 07/27/2021 CLINICAL DATA:  Right upper quadrant abdominal pain, with sonographic Murphy sign, gallstones, and gallbladder wall thickening on ultrasound. EXAM: NUCLEAR MEDICINE HEPATOBILIARY IMAGING TECHNIQUE: Sequential images of the abdomen were obtained out to 60 minutes following intravenous administration of radiopharmaceutical. RADIOPHARMACEUTICALS:  5.3 mCi Tc-77m  Choletec IV COMPARISON:  Abdominal ultrasound 07/27/2021  FINDINGS: There is satisfactory uptake of radiopharmaceutical from the blood pool. Biliary activity is visible 15 minutes. Gallbladder activity visible by 20 minutes. Consecutive images show filling of the gallbladder. Bowel activity is shown by 70 minutes. IMPRESSION: 1. Currently the cystic duct is patent, with gallbladder activity visible 20 minutes after injection. The CBD is also patent, with bowel activity visible at 70 minutes and thereafter. Electronically Signed   By: Van Clines M.D.   On: 07/27/2021 15:07   DG Chest Port 1 View  Result Date: 07/27/2021 CLINICAL DATA:  Fall EXAM: PORTABLE CHEST 1 VIEW  COMPARISON:  11/13/2020 FINDINGS: Heart and mediastinal contours are within normal limits. No focal opacities or effusions. No acute bony abnormality. No visible rib fracture or pneumothorax. IMPRESSION: No active disease. Electronically Signed   By: Rolm Baptise M.D.   On: 07/27/2021 03:27   EEG adult  Result Date: 07/27/2021 Greta Doom, MD     07/28/2021  9:13 AM History: 81 yo M being evaluated for encephalopathy Sedation: none Technique: This EEG was acquired with electrodes placed according to the International 10-20 electrode system (including Fp1, Fp2, F3, F4, C3, C4, P3, P4, O1, O2, T3, T4, T5, T6, A1, A2, Fz, Cz, Pz). The following electrodes were missing or displaced: none. Background: There is a poorly formed and poorly sustained posterior dominant rhythm achieving a maximal frequency of 9-10 Hz. In addition there is diffuse generalized irregular delta and theta range activity seen throughout the recording. Well formed sleep is not recorded. No epileptiform discharges were seen. Photic stimulation: Physiologic driving is not performed EEG Abnormalities: 1) Generalized irregular slow activity. Clinical Interpretation: This EEG is consistent with a generalized non-specific cerebral dysfunction(encephalopathy). There was no seizure or seizure predisposition recorded on this study. Please note that lack of epileptiform activity on EEG does not preclude the possibility of epilepsy. Roland Rack, MD [facility] [phone] If 7pm- 7am, please page neurology on call as listed in [place].   ECHOCARDIOGRAM COMPLETE  Result Date: 07/27/2021    ECHOCARDIOGRAM REPORT   Patient Name:   RANDALE CARVALHO Date of Exam: 07/27/2021 Medical Rec #:  [id number]      Height:       71.0 in Accession #:    [id number]     Weight:       213.0 lb Date of Birth:  September 21, 1940      BSA:          2.166 m Patient Age:    59 years       BP:           144/75 mmHg Patient Gender: M              HR:            86 bpm. Exam Location:  Inpatient Procedure: 2D Echo, Cardiac Doppler, Color Doppler and Intracardiac            Opacification Agent Indications:     Stroke  History:         Patient has no prior history of Echocardiogram examinations.  Sonographer:     Jyl Heinz Referring Phys:  [id number] RONDELL A Nell Diagnosing Phys: Oswaldo Milian MD IMPRESSIONS  1. Left ventricular ejection fraction, by estimation, is 60 to 65%. The left ventricle has normal function. The left ventricle has no regional wall motion abnormalities. There is mild left ventricular hypertrophy. Left ventricular diastolic parameters are consistent with Grade I diastolic dysfunction (impaired relaxation).  2. Right ventricular  systolic function is normal. The right ventricular size is normal. Tricuspid regurgitation signal is inadequate for assessing PA pressure.  3. The mitral valve is grossly normal. No evidence of mitral valve regurgitation. No evidence of mitral stenosis.  4. The aortic valve was not well visualized. Aortic valve regurgitation is not visualized. No aortic stenosis is present. FINDINGS  Left Ventricle: Left ventricular ejection fraction, by estimation, is 60 to 65%. The left ventricle has normal function. The left ventricle has no regional wall motion abnormalities. The left ventricular internal cavity size was normal in size. There is  mild left ventricular hypertrophy. Left ventricular diastolic parameters are consistent with Grade I diastolic dysfunction (impaired relaxation). Right Ventricle: The right ventricular size is normal. Right vetricular wall thickness was not well visualized. Right ventricular systolic function is normal. Tricuspid regurgitation signal is inadequate for assessing PA pressure. Left Atrium: Left atrial size was normal in size. Right Atrium: Right atrial size was normal in size. Pericardium: There is no evidence of pericardial effusion. Presence of epicardial fat layer. Mitral Valve: The mitral  valve is grossly normal. No evidence of mitral valve regurgitation. No evidence of mitral valve stenosis. Tricuspid Valve: The tricuspid valve is normal in structure. Tricuspid valve regurgitation is trivial. Aortic Valve: The aortic valve was not well visualized. Aortic valve regurgitation is not visualized. No aortic stenosis is present. Aortic valve peak gradient measures 11.8 mmHg. Pulmonic Valve: The pulmonic valve was not well visualized. Pulmonic valve regurgitation is not visualized. Aorta: The aortic root and ascending aorta are structurally normal, with no evidence of dilitation. IAS/Shunts: The interatrial septum was not well visualized.  LEFT VENTRICLE PLAX 2D LVIDd:         4.20 cm      Diastology LVIDs:         2.80 cm      LV e' medial:    4.79 cm/s LV PW:         1.40 cm      LV E/e' medial:  11.0 LV IVS:        1.20 cm      LV e' lateral:   7.07 cm/s LVOT diam:     2.00 cm      LV E/e' lateral: 7.4 LV SV:         60 LV SV Index:   28 LVOT Area:     3.14 cm  LV Volumes (MOD) LV vol d, MOD A2C: 105.0 ml LV vol d, MOD A4C: 118.0 ml LV vol s, MOD A2C: 36.7 ml LV vol s, MOD A4C: 42.4 ml LV SV MOD A2C:     68.3 ml LV SV MOD A4C:     118.0 ml LV SV MOD BP:      74.1 ml RIGHT VENTRICLE RV Basal diam:  3.50 cm RV Mid diam:    2.70 cm RV S prime:     14.50 cm/s TAPSE (M-mode): 2.2 cm LEFT ATRIUM             Index        RIGHT ATRIUM           Index LA diam:        3.40 cm 1.57 cm/m   RA Area:     15.00 cm LA Vol (A2C):   42.5 ml 19.62 ml/m  RA Volume:   36.30 ml  16.76 ml/m LA Vol (A4C):   66.2 ml 30.57 ml/m LA Biplane Vol: 55.0 ml 25.40 ml/m  AORTIC VALVE  AV Area (Vmax): 2.03 cm AV Vmax:        172.00 cm/s AV Peak Grad:   11.8 mmHg LVOT Vmax:      111.00 cm/s LVOT Vmean:     73.500 cm/s LVOT VTI:       0.190 m  AORTA Ao Root diam: 3.80 cm Ao Asc diam:  3.30 cm MITRAL VALVE MV Area (PHT): 4.44 cm    SHUNTS MV Decel Time: 171 msec    Systemic VTI:  0.19 m MV E velocity: 52.50 cm/s  Systemic Diam: 2.00  cm MV A velocity: 75.50 cm/s MV E/A ratio:  0.70 Oswaldo Milian MD Electronically signed by Oswaldo Milian MD Signature Date/Time: 07/27/2021/3:59:23 PM    Final (Updated)    VAS US CAROTID  Result Date: 07/30/2021 Carotid Arterial Duplex Study Patient Name:  NIKOLOS BILLIG  Date of Exam:   07/27/2021 Medical Rec #: [id number]       Accession #:    [id number] Date of Birth: [birth year]       Patient Gender: M Patient Age:   17 years Exam Location:  [place] Procedure:      VAS US CAROTID Referring Phys: Joseph Berkshire --------------------------------------------------------------------------------  Indications:       CVA, Carotid artery disease and bilateral endarterectomies                    (right 2006, left 2010). Risk Factors:      Hypertension, past history of smoking, prior CVA. Other Factors:     History of bladder cancer. Comparison Study:  Prior Carotid duplex done 01/30/20 indicated 1-39% ICA                    stenosis bilaterally and patent CEAs. Performing Technologist: Sharion Dove RVS  Examination Guidelines: A complete evaluation includes B-mode imaging, spectral Doppler, color Doppler, and power Doppler as needed of all accessible portions of each vessel. Bilateral testing is considered an integral part of a complete examination. Limited examinations for reoccurring indications may be performed as noted.  Right Carotid Findings: +----------+--------+--------+--------+------------------+--------+             PSV cm/s EDV cm/s Stenosis Plaque Description Comments  +----------+--------+--------+--------+------------------+--------+  CCA Prox   78       16                                             +----------+--------+--------+--------+------------------+--------+  CCA Distal 94       19                                             +----------+--------+--------+--------+------------------+--------+  ICA Prox   44       11       1-39%    heterogenous                  +----------+--------+--------+--------+------------------+--------+  ICA Distal 67       20                                             +----------+--------+--------+--------+------------------+--------+  ECA  97       22                                             +----------+--------+--------+--------+------------------+--------+ +----------+--------+-------+--------+-------------------+             PSV cm/s EDV cms Describe Arm Pressure (mmHG)  +----------+--------+-------+--------+-------------------+  Subclavian 167                                            +----------+--------+-------+--------+-------------------+ +---------+--------+--+--------+--+  Vertebral PSV cm/s 67 EDV cm/s 20  +---------+--------+--+--------+--+  Left Carotid Findings: +----------+--------+--------+--------+------------------+------------------+             PSV cm/s EDV cm/s Stenosis Plaque Description Comments            +----------+--------+--------+--------+------------------+------------------+  CCA Prox   148      18                                   intimal thickening  +----------+--------+--------+--------+------------------+------------------+  CCA Distal 108      14                                   intimal thickening  +----------+--------+--------+--------+------------------+------------------+  ICA Prox   48       14       1-39%    heterogenous                           +----------+--------+--------+--------+------------------+------------------+  ICA Distal 92       25                                                       +----------+--------+--------+--------+------------------+------------------+  ECA        123      21                                                       +----------+--------+--------+--------+------------------+------------------+ +----------+--------+--------+--------+-------------------+             PSV cm/s EDV cm/s Describe Arm Pressure (mmHG)   +----------+--------+--------+--------+-------------------+  Subclavian 93                                              +----------+--------+--------+--------+-------------------+ +---------+--------+--+--------+--+  Vertebral PSV cm/s 62 EDV cm/s 17  +---------+--------+--+--------+--+   Summary: Right Carotid: Velocities in the right ICA are consistent with a 1-39% stenosis. Left Carotid: There is no evidence of stenosis in the left ICA. Vertebrals:  Bilateral vertebral arteries demonstrate antegrade flow. Subclavians: Normal flow hemodynamics were seen in bilateral subclavian              arteries. *  See table(s) above for measurements and observations.  Electronically signed by Antony Contras MD on 07/30/2021 at 11:56:02 AM.    Final    VAS [place] TRANSCRANIAL DOPPLER  Result Date: 07/30/2021  Transcranial Doppler Patient Name:  HOYLE BARKDULL  Date of Exam:   07/27/2021 Medical Rec #: [id number]       Accession #:    [id number] Date of Birth: 1940/09/24       Patient Gender: M Patient Age:   89 years Exam Location:  [facility] Procedure:      VAS [place] TRANSCRANIAL DOPPLER Referring Phys: PRAMOD SETHI --------------------------------------------------------------------------------  Indications: Stroke. Comparison Study: No prior study Performing Technologist: Sharion Dove RVS  Examination Guidelines: A complete evaluation includes B-mode imaging, spectral Doppler, color Doppler, and power Doppler as needed of all accessible portions of each vessel. Bilateral testing is considered an integral part of a complete examination. Limited examinations for reoccurring indications may be performed as noted.  +----------+-------------+----------+-----------+-------+  RIGHT TCD  Right VM (cm) Depth (cm) Pulsatility Comment  +----------+-------------+----------+-----------+-------+  MCA            51.00                   1.29              +----------+-------------+----------+-----------+-------+  ACA           -36.00                    1.20              +----------+-------------+----------+-----------+-------+  Term ICA       51.00                   1.28              +----------+-------------+----------+-----------+-------+  PCA            25.00                   1.14              +----------+-------------+----------+-----------+-------+  Opthalmic      30.00                   1.30              +----------+-------------+----------+-----------+-------+  ICA siphon     22.00                   1.22              +----------+-------------+----------+-----------+-------+  Vertebral      36.00                   1.00              +----------+-------------+----------+-----------+-------+  +----------+------------+----------+-----------+------------------+  LEFT TCD   Left VM (cm) Depth (cm) Pulsatility      Comment        +----------+------------+----------+-----------+------------------+  MCA           101.00       4.30       1.56                         +----------+------------+----------+-----------+------------------+  ACA  Unable to insonate  +----------+------------+----------+-----------+------------------+  Term ICA      55.00                   1.51                         +----------+------------+----------+-----------+------------------+  PCA           34.00                   1.16                         +----------+------------+----------+-----------+------------------+  Opthalmic     25.00                   1.26                         +----------+------------+----------+-----------+------------------+  ICA siphon    23.00                   1.58                         +----------+------------+----------+-----------+------------------+  Vertebral     -21.00                  1.52                         +----------+------------+----------+-----------+------------------+  +------------+------+-------+               VM cm  Comment  +------------+------+-------+  Prox Basilar -27.00           +------------+------+-------+ Summary:  Elevated left middle cerebral artery mean flow velocities suggest mild stenosis. 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 *See table(s) above for TCD measurements and observations.  Diagnosing physician: Antony Contras MD Electronically signed by Antony Contras MD on 07/30/2021 at 11:58:01 AM.    Final    US ABDOMEN LIMITED RUQ (LIVER/GB)  Result Date: 07/27/2021 CLINICAL DATA:  Abdominal pain with elevated LFTs. EXAM: ULTRASOUND ABDOMEN LIMITED RIGHT UPPER QUADRANT COMPARISON:  CT without contrast 04/28/2021 FINDINGS: Gallbladder: The gallbladder is filled with stones, largest measurable stone approaching 1.5 cm. There is a thickened anterior free wall measuring 5 mm with a positive sonographic Murphy sign. This is concerning for acute cholecystitis. Common bile duct: Diameter: Unable to visualize. Liver: No focal lesion identified. There is increased hepatic echogenicity consistent with steatosis. Portal vein is patent on color Doppler imaging with normal direction of blood flow towards the liver. Other: 5 cm simple appearing cyst again noted in the upper pole right kidney. No free fluid is evident. IMPRESSION: 1. Mild thickening of the gallbladder with positive sonographic Murphy sign and multiple stones. Findings concerning for acute cholecystitis. No pericholecystic fluid. 2. Fatty liver. 3. Unable to visualize the bile ducts due to shadowing by the gallbladder and due to bowel gas. 4. 5 cm simple cyst right kidney. Electronically Signed   By: Telford Nab M.D.   On: 07/27/2021 06:10    Assessment: 81 y.o. male with PMH of right MCA stroke in 2006, carotid stenosis status post bilateral CEA first one on the right in 2006 and second one "several years later", CKD 3, hypertension and hyperlipidemia admitted on 07/27/2021 for lethargy, nausea vomiting and acute right-sided weakness.  MRI found to have  left BG and CR  infarct.  MRA head showed left M1 high-grade stenosis and moderate right ICA stenosis.  Carotid Doppler unremarkable.  EF 60 to 65%.  EEG no seizure.  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  Discharged to CIR on 07/29/2021 with DAPT for 3 months and fenofibrate given statin allergy.  Patient has been working with PT/OT, doing well.  While working with OT today standing beside sink, feeding lightheaded and dizzy, and lowered himself to the floor in a controlled fashion with OT assistance.  Did not hit head. CT stat done showed evolving left BG/CR infarct and punctate hemorrhagic conversion.  Patient neuro stable, no significant change.  Had long discussion with patient wife and son at bedside, answered all their questions.  Etiology for patient fall likely due to orthostatic hypotension with lightheadedness and dizziness feeding.  Given stable neuro exam, small size of hemorrhagic transformation, no need to change antiplatelet regimen.  Close neuro monitoring and stat CT if neuro changes.  Recommend to check orthostatic vitals with PT/OT in a.m. to rule out orthostatic hypotension.  Recommend TED hose to be placed.  Plan: Given stable neuro exam, small size of hemorrhagic transformation, no need to change antiplatelet regimen.  Continue current management Close neuro monitoring and stat CT if neuro changes.  Check orthostatic vitals with PT/OT in a.m. to rule out orthostatic hypotension.   Recommend TED hose. Will follow   Thank you for this consultation and allowing [place] to participate in the care of this patient.  Rosalin Hawking, MD PhD Stroke Neurology 07/31/2021 6:19 PM

## 2021-07-31 NOTE — Progress Notes (Signed)
IVT consulted for difficult stick.  Upon arrival, MD at bedside and pt about to eat.  RN Merrily Pew said he will place new consult when pt ready

## 2021-07-31 NOTE — Progress Notes (Signed)
CT performed for a fall, small hemorrhagic conversion left subcortical area.  Reviewed by Dr. Erlinda Hong who felt that this was small and did not require any change in antiplatelet.  No other work-up recommended at this time.

## 2021-07-31 NOTE — Progress Notes (Addendum)
Called to room by RR team after patient had controlled decent to floor with occupational therapist, wife and son. OT had him standing at sink shaving, when felt "light-headed and dizzy". He maintained his BP and pulse and did not lose consciousness. EKG with SR, rate of 78. Sys BP 140's. Awake and alert when entering room. Staff stood patient up on his feet and he pivoted to bed. No head injury, denies pain,not diaphoretic, no vomiting. Wife feels as though his speech is a bit more slurred.  Has been eating well, not drinking adequate fluids per family. Is incontinent of urine since admission. (Chronic dribbling at home. Is s/p treatment for bladder cancer.) Started on Keflex this morning due to UA positive for moderate leukocytes, few bacteria. Culture results pending. Review of labs from yesterday are stable/unremarkable. Home Flomax increased to 0.8 mg, on Norvasc 10 mg.  Stat CT head ordered.  Discussed placing condom catheter at nighttime to assist with comfort and assessing UOP. Reviewed with nursing staff to check for incontinence every 2 hours and to encourage PO fluids.   Wife and son aware of plan and questions answered. Transport arrived to take patient to radiology.  Reviewed event with Dr. Ranell Patrick and Dr. Letta Pate. CT report reviewed. Lovenox discontinued.  IMPRESSION: 1. Continued evolution of the left basal ganglia and corona radiata infarct seen previously. New small 5 mm focal hemorrhage within the periphery of the infarct bed. 2. No mass effect or midline shift. 3. Large remote previous right MCA territory infarct and resulting encephalomalacia.   Critical Value/emergent results were called by telephone at the time of interpretation on 07/31/2021 at 4:04 pm to provider [facility] , who verbally acknowledged these results.  Electronically Signed   By: Randa Ngo M.D.   On: 07/31/2021 16:10  Neurology consulted.

## 2021-07-31 NOTE — Progress Notes (Signed)
Occupational Therapy Session Note  Patient Details  Name: William [name] MRN: [id number] Date of Birth: 10/07/1940  Today's Date: 08/01/2021 OT Individual Time: 2197-5883 OT Individual Time Calculation (min): 55 min    Short Term Goals: Week 1:  OT Short Term Goal 1 (Week 1): Pt will performed 2/3 toileting tasks with contact guard OT Short Term Goal 2 (Week 1): Pt will don shirt with mod A OT Short Term Goal 3 (Week 1): Pt will perform toilet transfer (on/off) with contact guard OT Short Term Goal 4 (Week 1): Pt will use bilateral hands in all functional tasks with mod VC OT Short Term Goal 5 (Week 1): Pt will stand at the sink to perform 2 functional tasks with contact guard for balance  Skilled Therapeutic Interventions/Progress Updates:    Pt greeted in bed, asleep, when gently woken pt refusing to participate. With encouragement from son Rob and OT, pt amenable. Noted significant deficits with motor planning abilities throughout session, pt requiring step by step cues to participate in all tasks. Mod A for supine<sit (pt already wearing Ted hose). Pt needing cues to uncap his razor and how to shave face. Pt tended to cap his razor, turn off razor, and then shave face. Pt often repeating the same words quietly and quickly. Per son, this is normal since his most recent hospitalization. Per son, pt tends not to include his Lt hand at baseline. Cues and increased time to wash UB, Min A to wash the Rt underarm and wring out wash cloths. Pt very externally distracted by items in the environment, tended to reach for/grab towels nearby and repeatedly dunk his Rt hand into basin of soapy water. Pt with increase in Lt anterior lean as well, needed Mod A for balance during UB dressing. Pt once again with a very difficult time motor planning when utilizing backwards chaining methods. Son reported that this was a significant decline in pts cognitive abilities and son Rob reported that he was "freaking out"  seeing these changes. Pt with some shakiness as well, but per son this is normal and son described behavior as "nervous shakes." Noted spasming in the Rt LE. +2 for pt to return to bed. He remained sitting up, all needs within reach and bed alarm set. Relayed functional changes to nurse and attending MD.  Pt A+O x4, unable to recall his birthday which is new per son.   Therapy Documentation Precautions:  Precautions Precautions: Fall Precaution Comments: L 11th and 12th rib fxs Restrictions Weight Bearing Restrictions: No  Pain: in ribs during bed mobility, notified nurse that he may appreciate some pain medicine for this   ADL: ADL Eating: Supervision/safety Grooming: Minimal assistance Upper Body Bathing: Minimal assistance Where Assessed-Upper Body Bathing: Shower Lower Body Bathing: Minimal assistance Where Assessed-Lower Body Bathing: Shower Upper Body Dressing: Maximal assistance Where Assessed-Upper Body Dressing: Wheelchair Lower Body Dressing: Maximal assistance Where Assessed-Lower Body Dressing: Wheelchair Toileting: Maximal assistance Where Assessed-Toileting: Toilet, Recruitment consultant Transfer: Environmental education officer: Moderate assistance Social research officer, government Method: Heritage manager: Radio broadcast assistant   Therapy/Group: Individual Therapy  Czarina Gingras A Matayah Reyburn 08/01/2021, 12:59 PM

## 2021-07-31 NOTE — Progress Notes (Signed)
Physical Therapy Session Note  Patient Details  Name: William [name] MRN: [id number] Date of Birth: [birth year]  Today's Date: 07/31/2021 PT Individual Time: 0800-0900 PT Individual Time Calculation (min): 60 min   Short Term Goals: Week 1:  PT Short Term Goal 1 (Week 1): Patient will complete supine <> sit with MinA PT Short Term Goal 2 (Week 1): Patient will compelte sit <> stand with MinA and LRAD PT Short Term Goal 3 (Week 1): Patient will ambulate >2f with LRAD and MinA PT Short Term Goal 4 (Week 1): Patient will initiate stair training  Skilled Therapeutic Interventions/Progress Updates:    Pt met supine w/ HOB raised eating breakfast at start of session and reported 9/10 pain rib pain due to fx. Wife arrived and assisted pt w/ finishing breakfast. Donned pants modA in supine w/ bed flat, pt able to pull pants up to hips and perform supine bridge w/ heavy VC to clear pants over hips. Bed rails lowered to assess rolling bed mobility. Pt unable to roll L due to pain and modA w/ bed rail assist to roll R. Sidelying<>sit modA for trunk support. Unexpected X1 posterior LOB while sitting EOB w/ back unsupported after 1 min due to posterior trunk lean. MaxA+2 to return to sitting EOB. Donned shoes maxA for time and stand pivot modA to w/c. Pt required heavy VC and tactile cues for forward lean and hand placement during sit<>stand. Dependent transfer to ortho gym for time and energy conservation. Car transfer attempted after 748fambulation w/ RW CGA but pt unable to shift hips posteriorly to clear LE into car due to motor planning deficits despite maxA+2 attempt. Ambulated as noted above ~1045fo mat to focus on trunk control and motor planning. 2x5 seated forward weight shift w/ 2lb bar modA+2 posterior trunk support requiring heavy VC to complete. 5x STS CGA w/ heavy VC for forward trunk lean. Stand pivot mat<>w/c CGA w/ pt unable to scoot back in chair w/o standing up to reposition. RUE and RLE  tremor noted during reaching and sitting EOB w/ feet unsupported. Dependent transfer back to room, MD present present w/ wife discussing pain management for rib fx, pt left sitting up in w/c, seat belt alarm on, call bell in reach, and all needs met.  Therapy Documentation Precautions:  Precautions Precautions: Fall Precaution Comments: L 11th and 12th rib fxs Restrictions Weight Bearing Restrictions: No General:    Therapy/Group: Individual Therapy  ConLucile ShuttersPT  07/31/2021, 9:29 AM

## 2021-08-01 ENCOUNTER — Inpatient Hospital Stay (HOSPITAL_COMMUNITY): Payer: PPO

## 2021-08-01 LAB — BASIC METABOLIC PANEL
Anion gap: 10 (ref 5–15)
BUN: 26 mg/dL — ABNORMAL HIGH (ref 8–23)
CO2: 20 mmol/L — ABNORMAL LOW (ref 22–32)
Calcium: 8.5 mg/dL — ABNORMAL LOW (ref 8.9–10.3)
Chloride: 105 mmol/L (ref 98–111)
Creatinine, Ser: 2.07 mg/dL — ABNORMAL HIGH (ref 0.61–1.24)
GFR, Estimated: 32 mL/min — ABNORMAL LOW (ref >60)
Glucose, Bld: 113 mg/dL — ABNORMAL HIGH (ref 70–99)
Potassium: 4 mmol/L (ref 3.5–5.1)
Sodium: 135 mmol/L (ref 135–145)

## 2021-08-01 LAB — GLUCOSE, CAPILLARY
Glucose-Capillary: 104 mg/dL — ABNORMAL HIGH (ref 70–99)
Glucose-Capillary: 103 mg/dL — ABNORMAL HIGH (ref 70–99)
Glucose-Capillary: 104 mg/dL — ABNORMAL HIGH (ref 70–99)
Glucose-Capillary: 126 mg/dL — ABNORMAL HIGH (ref 70–99)

## 2021-08-01 LAB — URINE CULTURE: Culture: NO GROWTH

## 2021-08-01 LAB — CARBAMAZEPINE LEVEL, TOTAL: Carbamazepine Lvl: 5.5 ug/mL (ref 4.0–12.0)

## 2021-08-01 MED ORDER — TAMSULOSIN HCL 0.4 MG PO CAPS
0.4000 mg | ORAL_CAPSULE | Freq: Every day | ORAL | Status: DC
  Administered 2021-08-01 – 2021-08-17 (×17): 0.4 mg via ORAL
  Filled 2021-08-01 (×17): qty 1

## 2021-08-01 MED ORDER — LORAZEPAM 0.5 MG PO TABS
0.2500 mg | ORAL_TABLET | Freq: Every day | ORAL | Status: DC
  Administered 2021-08-02 – 2021-08-18 (×17): 0.25 mg via ORAL
  Filled 2021-08-01 (×17): qty 1

## 2021-08-01 NOTE — Progress Notes (Signed)
PROGRESS NOTE   Subjective/Complaints:  Call by RN and approached by OT regarding worsening of mental status , family at bedside (son from out of town) concerned about change Received IVF last noc    ROS: Occ cough , no CP or SOB   Objective:   CT HEAD WO CONTRAST (5MM)  Result Date: 07/31/2021 CLINICAL DATA:  Recent CVA EXAM: CT HEAD WITHOUT CONTRAST TECHNIQUE: Contiguous axial images were obtained from the base of the skull through the vertex without intravenous contrast. RADIATION DOSE REDUCTION: This exam was performed according to the departmental dose-optimization program which includes automated exposure control, adjustment of the mA and/or kV according to patient size and/or use of iterative reconstruction technique. COMPARISON:  07/27/2021 CT and MRI FINDINGS: Brain: There has been continued evolution of the left basal ganglia and corona radiata infarct seen previously. In the periphery of the infarct bed there is a 5 mm hyperdense focus consistent with punctate hemorrhage. No significant mass effect. Chronic encephalomalacia throughout the right cerebral hemisphere from prior right MCA territory infarct. Lateral ventricles and midline structures are stable. No acute extra-axial fluid collections. No mass effect or midline shift. Vascular: Stable atherosclerosis.  No hyperdense vessel. Skull: Normal. Negative for fracture or focal lesion. Sinuses/Orbits: Minimal polypoid mucosal thickening right maxillary sinus. Remaining sinuses are clear. Other: None. IMPRESSION: 1. Continued evolution of the left basal ganglia and corona radiata infarct seen previously. New small 5 mm focal hemorrhage within the periphery of the infarct bed. 2. No mass effect or midline shift. 3. Large remote previous right MCA territory infarct and resulting encephalomalacia. Critical Value/emergent results were called by telephone at the time of interpretation on  07/31/2021 at 4:04 pm to provider [facility] , who verbally acknowledged these results. Electronically Signed   By: Randa Ngo M.D.   On: 07/31/2021 16:10   Recent Labs    07/30/21 0606  WBC 8.8  HGB 9.1*  HCT 27.7*  PLT 184    Recent Labs    07/30/21 0606 08/01/21 0558  NA 141 135  K 3.8 4.0  CL 109 105  CO2 23 20*  GLUCOSE 121* 113*  BUN 23 26*  CREATININE 2.05* 2.07*  CALCIUM 8.9 8.5*     Intake/Output Summary (Last 24 hours) at 08/01/2021 1222 Last data filed at 08/01/2021 0800 Gross per 24 hour  Intake 945 ml  Output 2350 ml  Net -1405 ml         Physical Exam: Vital Signs Blood pressure (!) 150/82, pulse 80, temperature 98.3 F (36.8 C), temperature source Oral, resp. rate 16, height 5\' 11"  (1.803 m), weight 95.2 kg, SpO2 97 %.  General: No acute distress Mood and affect are appropriate Heart: Regular rate and rhythm no rubs murmurs or extra sounds Lungs: diminished BS at bases, breathing unlabored, no rales or wheezes Abdomen: Positive bowel sounds, soft nontender to palpation, nondistended Extremities: No clubbing, cyanosis, or edema Skin: No evidence of breakdown, no evidence of rash Neuro:  Eyes without evidence of nystagmus  Tone is normal without evidence of spasticity Cerebellar exam shows no evidence of ataxia on finger nose finger or heel to shin testing No evidence of  trunkal ataxia  Sensory exam is normal light touch in the upper and diminished in lower limbs   Cranial nerves II- Visual fields are intact to confrontation testing, no blurring of vision III- no evidence of ptosis, upward, downward and medial gaze intact IV- no vertical diplopia or head tilt V- no facial numbness or masseter weakness VI- no pupil abduction weakness VII- no facial droop, good lid closure VII- normal auditory acuity IX- no pharygeal weakness, gag nl X- no pharyngeal weakness, no hoarseness XI- no trap or SCM weakness XII- no glossal  weakness  Mild dysarthria , no aphasia, mild motor apraxia   Neuro: Alert and oriented to person place , not time. RUE 4/5, LUE 4/5 proximally and 4/5 distally, bilateral lower extremities 3/5.  Able to tolerate standing.      Assessment/Plan: 1. Functional deficits which require 3+ hours per day of interdisciplinary therapy in a comprehensive inpatient rehab setting. Physiatrist is providing close team supervision and 24 hour management of active medical problems listed below. Physiatrist and rehab team continue to assess barriers to discharge/monitor patient progress toward functional and medical goals  Care Tool:  Bathing    Body parts bathed by patient: Right arm, Left arm, Chest, Abdomen, Front perineal area, Buttocks, Right upper leg, Left upper leg, Face   Body parts bathed by helper: Right lower leg, Left lower leg     Bathing assist Assist Level: Minimal Assistance - Patient > 75%     Upper Body Dressing/Undressing Upper body dressing   What is the patient wearing?: Pull over shirt    Upper body assist Assist Level: Maximal Assistance - Patient 25 - 49%    Lower Body Dressing/Undressing Lower body dressing      What is the patient wearing?: Pants, Underwear/pull up     Lower body assist Assist for lower body dressing: Maximal Assistance - Patient 25 - 49%     Toileting Toileting    Toileting assist Assist for toileting: Total Assistance - Patient < 25%     Transfers Chair/bed transfer  Transfers assist     Chair/bed transfer assist level: 2 Helpers     Locomotion Ambulation   Ambulation assist      Assist level: Moderate Assistance - Patient 50 - 74% Assistive device: Walker-rolling Max distance: 25   Walk 10 feet activity   Assist     Assist level: Moderate Assistance - Patient - 50 - 74% Assistive device: Walker-rolling   Walk 50 feet activity   Assist Walk 50 feet with 2 turns activity did not occur: Safety/medical  concerns         Walk 150 feet activity   Assist Walk 150 feet activity did not occur: Safety/medical concerns         Walk 10 feet on uneven surface  activity   Assist Walk 10 feet on uneven surfaces activity did not occur: Safety/medical concerns         Wheelchair     Assist Is the patient using a wheelchair?: Yes Type of Wheelchair: Manual    Wheelchair assist level: Total Assistance - Patient < 25% Max wheelchair distance: 150    Wheelchair 50 feet with 2 turns activity    Assist        Assist Level: Total Assistance - Patient < 25%   Wheelchair 150 feet activity     Assist      Assist Level: Total Assistance - Patient < 25%   Blood pressure (!) 150/82, pulse 80,  temperature 98.3 F (36.8 C), temperature source Oral, resp. rate 16, height 5\' 11"  (1.803 m), weight 95.2 kg, SpO2 97 %.  Medical Problem List and Plan: 1. 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 Discussed estimate of 2-3 week stay with wife HFU scheduled.  Continue CIR 2.  Impaired mobility:  -DVT/anticoagulation:  Mechanical: Sequential compression devices, below knee Bilateral lower extremities; start Lovenox.              -antiplatelet therapy: aspirin 3. Rib fracture pain: Tylenol, tramadol as needed. Start lidocaine patch. Discussed that this should be applied directly over area of pain 4. Depression: Start trazodone 50mg  HS LCSW asked to evaluate and provide emotional support             --continue carbamazepine              -antipsychotic agents: Not applicable 5. Neuropsych: This patient is not capable of making decisions on his own behalf. 6. Skin/Wound Care: Routine skin care checks 7. 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 Discussed labs with wife.  --Monitor urine output and chemistries 16: Transaminitis: Monitor chemistries 17: Hypertension: Continue Norvasc and hydralazine             -- Home Zestoretic held secondary to kidney function. Discussed this with patient.  18: Hyperlipidemia: Continue fenofibrate 19: Bilateral carotid artery stenosis: Asymptomatic             -- Status post bilateral carotid endarterectomies 20: Anemia, chronic disease, hemodilution etiologies likely: Monitor CBC             -- Fecal occult blood negative 2/13             -- Colonoscopy 2016, polyps.  Recall 5 years 21. Prediabetes: check CBGs AC/HS.  22. Hypokalemia: supplement 67meq on 2/17.  23. Suboptimal vitamin D level: continue 2000U daily.  24. Insomnia: start trazodone 50mg  HS. Start sleep chart. 25. Worsening of cognitive status with no new focal weakness, recent labwork and vitals unremarkable , will check CXR, as discussed with Dr Erlinda Hong repeat CT head to monitor size of hemorrhagic conversion  LOS: 3 days A FACE TO FACE EVALUATION WAS PERFORMED  Charlett Blake 08/01/2021, 12:23 PM

## 2021-08-01 NOTE — Progress Notes (Signed)
Physical Therapy Session Note  Patient Details  Name: William [name] MRN: [id number] Date of Birth: 1940-11-24  Today's Date: 08/01/2021 PT Individual Time: 0800-0845 PT Individual Time Calculation (min): 45 min  PT Missed time - 45 min due to fatigue  Short Term Goals: Week 1:  PT Short Term Goal 1 (Week 1): Patient will complete supine <> sit with MinA PT Short Term Goal 2 (Week 1): Patient will compelte sit <> stand with MinA and LRAD PT Short Term Goal 3 (Week 1): Patient will ambulate >63f with LRAD and MinA PT Short Term Goal 4 (Week 1): Patient will initiate stair training  Skilled Therapeutic Interventions/Progress Updates:    Pt met supine w/ HOB raised eating breakfast at start of session. Pt reported no pain and doing ok after yesterday's sessions. BP assessed in L arm throughout the session and required frequent VC to keep arm relaxed during BP assessment. BP assessed in bed 151/67. Suction catheter disconnected and pt completed rolling maxA to R so brief could be secured. Pt's wife arrived and plan to monitor vitals during session communicated. Pt's wife reassured that vitals were being monitored and communicated to the care team. Pt transferred to sitting EOB w/ minA for trunk support and heavy VC, BP reassessed in sitting 146/96. Pt did not report any dizziness or light headedness at this time. Sit<>stand modA+2 after multiple attempts due to pt's poor motor planning to lean trunk forward during transfer. BP assessed in standing 127/62 w/ pt requesting to sit down due to dizziness and light headedness.  Dizziness subsided w/ rest. Stand pivot bed<>chair modA+1 w/ heavy VC to lean trunk forward and sequencing throughout transfer. Bil TED hose applied while sitting in w/c. Sit<>stand modA+1 from w/c w/ pt reporting dizziness and light headedness after 30s. Pt returned to sitting in w/c and BP assessed 109/61. Pt stated room was spinning after explaining dizziness. Sitting BP assessed  after 1 min sitting rest 128/58. Educated pt and wife on BP findings and upright tolerance throughout the day, wife reassured w/ continued monitor of BP as needed during sessions, pt left sitting upright in w/c, seat belt alarm on, call bell in hand, and all needs met. RN notified of BP findings during session.  BP: Lying down - 151/67 Sitting EOB - 146/96 Standing - 127/62 Sitting after stand - 109/61 Sitting at end of session - 128/58  Session 2 Pt just returned to room from imaging, MD present discussing results w/ family and pt fatigued. Missed 45 min of PT.  Therapy Documentation Precautions:  Precautions Precautions: Fall Precaution Comments: L 11th and 12th rib fxs Restrictions Weight Bearing Restrictions: No General:    Therapy/Group: Individual Therapy  CLucile Shutters SPT  08/01/2021, 9:23 AM

## 2021-08-01 NOTE — Progress Notes (Signed)
Reviewed CT scan of the head, no significant changes versus yesterday per neuroradiology report.  Neuro will review as well. Chest x-ray shows some atelectasis no signs of fluid overload or pneumonia. Primary issue is confusion, consider medications.  The patient has been on carbamazepine for mood swings prescribed by Dr. Casimiro Needle.  Also has been on Ativan for many years.  As discussed with patient wife and son these medications can cause mental status changes.  We will check carbamazepine level if this looks within normal range or low may need to consider lowering dose of Ativan.  Await neuro input

## 2021-08-01 NOTE — Progress Notes (Signed)
STROKE TEAM PROGRESS NOTE   SUBJECTIVE (INTERVAL HISTORY) His son and OT are at the bedside.  Per son, patient today much worse than yesterday, more confusion and mental status/physical strength both decline from yesterday.  Repeat CT showed stable small hemorrhagic transformation.  Patient symptoms wax and wane, concerning for encephalopathy.  Neuro exam no significant change.   OBJECTIVE Temp:  [98 F (36.7 C)-98.3 F (36.8 C)] 98.3 F (36.8 C) (02/18 0403) Pulse Rate:  [80-99] 80 (02/18 0403) Resp:  [16] 16 (02/18 0403) BP: (150-171)/(75-82) 150/82 (02/18 0403) SpO2:  [91 %-97 %] 97 % (02/18 0403)  Recent Labs  Lab 07/31/21 1656 07/31/21 2108 08/01/21 0640 08/01/21 1213 08/01/21 1706  GLUCAP 127* 128* 104* 103* 104*   Recent Labs  Lab 07/27/21 0307 07/28/21 0435 07/29/21 0339 07/30/21 0606 08/01/21 0558  NA 141 141 139 141 135  K 4.2 4.0 3.8 3.8 4.0  CL 106 110 112* 109 105  CO2 24 22 21* 23 20*  GLUCOSE 213* 117* 106* 121* 113*  BUN 32* 33* 26* 23 26*  CREATININE 2.47* 2.50* 2.02* 2.05* 2.07*  CALCIUM 9.1 9.0 8.4* 8.9 8.5*   Recent Labs  Lab 07/27/21 0307 07/28/21 0435 07/30/21 0606  AST 226* 92* 26  ALT 115* 101* 48*  ALKPHOS 122 85 65  BILITOT 1.2 0.7 0.5  PROT 6.9 6.0* 5.9*  ALBUMIN 3.3* 2.8* 2.7*   Recent Labs  Lab 07/27/21 0307 07/27/21 1619 07/28/21 0435 07/29/21 0339 07/30/21 0606  WBC 9.8  --  10.7* 10.0 8.8  NEUTROABS 7.9*  --   --  6.8 6.0  HGB 11.5* 11.0* 9.4* 8.9* 9.1*  HCT 34.5* 32.8* 28.8* 26.1* 27.7*  MCV 98.3  --  99.3 96.3 96.2  PLT 244  --  187 168 184   Recent Labs  Lab 07/27/21 0717  CKTOTAL 67   No results for input(s): LABPROT, INR in the last 72 hours. Recent Labs    07/30/21 1720  COLORURINE YELLOW  LABSPEC 1.010  PHURINE 5.0  GLUCOSEU NEGATIVE  HGBUR SMALL*  BILIRUBINUR NEGATIVE  KETONESUR NEGATIVE  PROTEINUR NEGATIVE  NITRITE NEGATIVE  LEUKOCYTESUR MODERATE*       Component Value Date/Time   CHOL  209 (H) 07/27/2021 0717   TRIG 73 07/27/2021 0717   HDL 69 07/27/2021 0717   CHOLHDL 3.0 07/27/2021 0717   VLDL 15 07/27/2021 0717   LDLCALC 125 (H) 07/27/2021 0717   Lab Results  Component Value Date   HGBA1C 5.3 07/27/2021      Component Value Date/Time   LABOPIA NONE DETECTED 07/22/2021 0728   COCAINSCRNUR NONE DETECTED 07/22/2021 0728   LABBENZ POSITIVE (A) 07/22/2021 0728   AMPHETMU NONE DETECTED 07/22/2021 0728   THCU NONE DETECTED 07/22/2021 0728   LABBARB NONE DETECTED 07/22/2021 0728    No results for input(s): ETH in the last 168 hours.  I have personally reviewed the radiological images below and agree with the radiology interpretations.  CT ABDOMEN PELVIS WO CONTRAST  Result Date: 07/29/2021 CLINICAL DATA:  Abdominal pain EXAM: CT ABDOMEN AND PELVIS WITHOUT CONTRAST TECHNIQUE: Multidetector CT imaging of the abdomen and pelvis was performed following the standard protocol without IV contrast. RADIATION DOSE REDUCTION: This exam was performed according to the departmental dose-optimization program which includes automated exposure control, adjustment of the mA and/or kV according to patient size and/or use of iterative reconstruction technique. COMPARISON:  CT abdomen/pelvis 04/28/2021, right upper quadrant ultrasound 07/27/2021 FINDINGS: Lower chest: There are patchy opacities in  the lung bases. Is mild calcification of the aortic valve. Coronary artery calcification is noted in the left anterior descending artery. Hepatobiliary: The liver is unremarkable, within the confines of noncontrast technique. There is hyperdensity within gallbladder likely reflecting a combination of stones and sludge. There is no appreciable gallbladder wall thickening and no significant pericholecystic fluid or inflammatory change. Pancreas: Unremarkable. Spleen: Unremarkable. Adrenals/Urinary Tract: The adrenals are unremarkable. Multiple right renal cysts are noted, the largest measuring up to 5.2  cm. No other focal lesions are seen, within the confines of noncontrast technique. No stones are seen. There is no hydronephrosis or hydroureter. The bladder is unremarkable. Stomach/Bowel: The stomach is unremarkable, allowing for underdistention. There is no evidence of bowel obstruction. There is no abnormal bowel wall thickening or inflammatory change. There is colonic diverticulosis without evidence of acute diverticulitis. The appendix is not definitively identified, but there is no pericecal inflammatory change. Vascular/Lymphatic: Is extensive calcified atherosclerotic plaque throughout the nonaneurysmal abdominal aorta. There is no abdominal or pelvic lymphadenopathy. 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 The prostate is mildly enlarged, unchanged. The seminal vesicles are unremarkable. Other: There is no ascites or free air. Musculoskeletal: There is a nondisplaced fracture of the left eleventh and twelfth ribs at the costochondral junctions (3-24, 3-30). There is an additional fracture of the eleventh rib laterally (3-38). There are nondisplaced fractures of the left transverse processes at T12 and L1 (6-133, 6-124). These are new since the prior study from 04/28/2021 and may be related to the recent fall. Vertebral body heights are preserved. Post kyphoplasty changes are seen at L1. There are flowing anterior osteophytes in the imaged lower thoracic spine consistent with diffuse idiopathic skeletal hyperostosis. IMPRESSION: 1. Nondisplaced fractures of the left eleventh and twelfth ribs (the twelfth rib is broken in two places), and left transverse processes at T12 through L3, new since the prior study from November 2022 and likely related to the recent fall. 2. Otherwise, no acute findings in the abdomen or pelvis. 3. Cholelithiasis and probable sludge without evidence of acute cholecystitis. 4. Diverticulosis without evidence of acute diverticulitis. 5. Patchy opacities in the lung bases  may reflect atelectasis, though infection can not be excluded. Aortic Atherosclerosis (ICD10-I70.0). Electronically Signed   By: Valetta Mole M.D.   On: 07/29/2021 13:32   DG Chest 2 View  Result Date: 08/01/2021 CLINICAL DATA:  Dysphagia EXAM: CHEST - 2 VIEW COMPARISON:  07/27/2021 FINDINGS: Unchanged cardiomediastinal silhouette. Small left pleural effusion. Adjacent left basilar opacities. No visible pneumothorax. Mild left shoulder osteoarthritis. No acute osseous abnormality. IMPRESSION: Small left pleural effusion. Adjacent left basilar opacities, likely atelectasis. Electronically Signed   By: Maurine Simmering M.D.   On: 08/01/2021 13:28   CT HEAD WO CONTRAST (5MM)  Result Date: 08/01/2021 CLINICAL DATA:  Stroke, follow up EXAM: CT HEAD WITHOUT CONTRAST TECHNIQUE: Contiguous axial images were obtained from the base of the skull through the vertex without intravenous contrast. RADIATION DOSE REDUCTION: This exam was performed according to the departmental dose-optimization program which includes automated exposure control, adjustment of the mA and/or kV according to patient size and/or use of iterative reconstruction technique. COMPARISON:  CT February 17, 23. FINDINGS: Brain: Similar appearance of evolving infarct in the left basal ganglia. Similar 5 mm focus of hemorrhage posteriorly. No progressive mass effect. Similar large remote right MCA territory infarct with encephalomalacia. Similar partial effacement of the left lateral ventricle with ex vacuo ventricular dilation right lateral ventricle. No hydrocephalus. No mass lesion.  Vascular: Calcific intracranial atherosclerosis. Skull: No acute fracture. Sinuses/Orbits: Small retention cyst or mucosal thickening in the inferior right maxillary sinus. Otherwise, clear sinuses. Unremarkable orbits. Other: No mastoid effusions. IMPRESSION: 1. Similar appearance of evolving infarct in the left basal ganglia. Similar 5 mm focus of hemorrhage posteriorly. No  progressive mass effect. 2. Large remote right MCA territory infarct. Electronically Signed   By: Margaretha Sheffield M.D.   On: 08/01/2021 13:13   CT HEAD WO CONTRAST (5MM)  Result Date: 07/31/2021 CLINICAL DATA:  Recent CVA EXAM: CT HEAD WITHOUT CONTRAST TECHNIQUE: Contiguous axial images were obtained from the base of the skull through the vertex without intravenous contrast. RADIATION DOSE REDUCTION: This exam was performed according to the departmental dose-optimization program which includes automated exposure control, adjustment of the mA and/or kV according to patient size and/or use of iterative reconstruction technique. COMPARISON:  07/27/2021 CT and MRI FINDINGS: Brain: There has been continued evolution of the left basal ganglia and corona radiata infarct seen previously. In the periphery of the infarct bed there is a 5 mm hyperdense focus consistent with punctate hemorrhage. No significant mass effect. Chronic encephalomalacia throughout the right cerebral hemisphere from prior right MCA territory infarct. Lateral ventricles and midline structures are stable. No acute extra-axial fluid collections. No mass effect or midline shift. Vascular: Stable atherosclerosis.  No hyperdense vessel. Skull: Normal. Negative for fracture or focal lesion. Sinuses/Orbits: Minimal polypoid mucosal thickening right maxillary sinus. Remaining sinuses are clear. Other: None. IMPRESSION: 1. Continued evolution of the left basal ganglia and corona radiata infarct seen previously. New small 5 mm focal hemorrhage within the periphery of the infarct bed. 2. No mass effect or midline shift. 3. Large remote previous right MCA territory infarct and resulting encephalomalacia. Critical Value/emergent results were called by telephone at the time of interpretation on 07/31/2021 at 4:04 pm to provider [facility] , who verbally acknowledged these results. Electronically Signed   By: Randa Ngo M.D.   On: 07/31/2021 16:10    CT HEAD WO CONTRAST (5MM)  Result Date: 07/27/2021 CLINICAL DATA:  Minor head trauma.  Vomiting and syncope EXAM: CT HEAD WITHOUT CONTRAST CT CERVICAL SPINE WITHOUT CONTRAST TECHNIQUE: Multidetector CT imaging of the head and cervical spine was performed following the standard protocol without intravenous contrast. Multiplanar CT image reconstructions of the cervical spine were also generated. RADIATION DOSE REDUCTION: This exam was performed according to the departmental dose-optimization program which includes automated exposure control, adjustment of the mA and/or kV according to patient size and/or use of iterative reconstruction technique. COMPARISON:  Brain MRI from 5 days ago FINDINGS: CT HEAD FINDINGS Brain: Interval and acute appearing infarct at the left stratum and corona radiata. Large remote right MCA territory infarct affecting the upper division. Brain atrophy. No acute hemorrhage or hydrocephalus Vascular: No hyperdense vessel or unexpected calcification. Skull: Normal. Negative for fracture or focal lesion. Sinuses/Orbits: No acute finding. CT CERVICAL SPINE FINDINGS Alignment: No traumatic malalignment Skull base and vertebrae: No acute fracture Soft tissues and spinal canal: No prevertebral fluid or swelling. No visible canal hematoma. Disc levels:  Ordinary degenerative changes. 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 These results were called by telephone at the time of interpretation on 07/27/2021 at 4:17 am to provider [facility] , who verbally acknowledged these results. IMPRESSION: 1. 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 2. No posttraumatic intracranial or cervical spine finding. 3. Remote right MCA upper division infarcts. Electronically Signed  By: Jorje Guild M.D.   On: 07/27/2021 04:17   CT HEAD WO CONTRAST  Result Date: 07/22/2021 CLINICAL DATA:  81 year old male with history of altered mental  status. EXAM: CT HEAD WITHOUT CONTRAST TECHNIQUE: Contiguous axial images were obtained from the base of the skull through the vertex without intravenous contrast. RADIATION DOSE REDUCTION: This exam was performed according to the departmental dose-optimization program which includes automated exposure control, adjustment of the mA and/or kV according to patient size and/or use of iterative reconstruction technique. COMPARISON:  Head CT 11/13/2020. FINDINGS: Brain: Mild cerebral atrophy. Again noted is a large area of low attenuation throughout the right cerebral hemisphere, compatible with extensive encephalomalacia related to remote right MCA territory infarct. Well-defined foci of low attenuation also noted in the left external capsule, and in the head of the right caudate nucleus, compatible with old lacunar infarcts. No evidence of acute infarction, hemorrhage, hydrocephalus, extra-axial collection or mass lesion/mass effect. Vascular: No hyperdense vessel or unexpected calcification. Skull: Normal. Negative for fracture or focal lesion. Sinuses/Orbits: No acute finding. Other: None. IMPRESSION: 1. No acute intracranial abnormalities. 2. 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 3. Mild cerebral atrophy. Electronically Signed   By: Vinnie Langton M.D.   On: 07/22/2021 06:58   CT CERVICAL SPINE WO CONTRAST  Result Date: 07/27/2021 CLINICAL DATA:  Minor head trauma.  Vomiting and syncope EXAM: CT HEAD WITHOUT CONTRAST CT CERVICAL SPINE WITHOUT CONTRAST TECHNIQUE: Multidetector CT imaging of the head and cervical spine was performed following the standard protocol without intravenous contrast. Multiplanar CT image reconstructions of the cervical spine were also generated. RADIATION DOSE REDUCTION: This exam was performed according to the departmental dose-optimization program which includes automated  exposure control, adjustment of the mA and/or kV according to patient size and/or use of iterative reconstruction technique. COMPARISON:  Brain MRI from 5 days ago FINDINGS: CT HEAD FINDINGS Brain: Interval and acute appearing infarct at the left stratum and corona radiata. Large remote right MCA territory infarct affecting the upper division. Brain atrophy. No acute hemorrhage or hydrocephalus Vascular: No hyperdense vessel or unexpected calcification. Skull: Normal. Negative for fracture or focal lesion. Sinuses/Orbits: No acute finding. CT CERVICAL SPINE FINDINGS Alignment: No traumatic malalignment Skull base and vertebrae: No acute fracture Soft tissues and spinal canal: No prevertebral fluid or swelling. No visible canal hematoma. Disc levels:  Ordinary degenerative changes. 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 These results were called by telephone at the time of interpretation on 07/27/2021 at 4:17 am to provider [facility] , who verbally acknowledged these results. IMPRESSION: 1. 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 2. No posttraumatic intracranial or cervical spine finding. 3. Remote right MCA upper division infarcts. Electronically Signed   By: Jorje Guild M.D.   On: 07/27/2021 04:17   MR ANGIO HEAD WO CONTRAST  Result Date: 07/27/2021 CLINICAL DATA:  Neuro deficit, acute, stroke suspected EXAM: MRI HEAD WITHOUT CONTRAST MRA HEAD WITHOUT CONTRAST TECHNIQUE: Multiplanar, multi-echo pulse sequences of the brain and surrounding structures were acquired without intravenous contrast. Angiographic images of the Circle of Willis were acquired using MRA technique without intravenous contrast. COMPARISON:  CT head from the same day. MRA August 07, 2005 without report. 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 Associated edema without mass effect.  Large remote right MCA territory infarct with encephalomalacia and surrounding gliosis. Evidence of some prior hemorrhage along the posterior aspect of this remote infarct. No evidence of acute hemorrhage. Vascular: See below. Skull and upper cervical spine: Normal marrow signal. Sinuses/Orbits: Mild paranasal sinus mucosal thickening. Unremarkable orbits. Other: No sizable mastoid effusions. MRA HEAD FINDINGS Anterior circulation: Bilateral intracranial ICAs are patent. Moderate right paraclinoid ICA stenosis. Subtle apparent filling defect of the right paraclinoid ICA in the region of stenosis is favored to reflect flow artifact given preserved T2 hypointense flow void on MRI. Bilateral MCAs are patent. Severe distal left M1 MCA stenosis. Attenuated distal right MCA vessels in the region of remote infarct. Bilateral ACAs are patent without proximal hemodynamically significant stenosis. Posterior circulation: Only the distal most intradural vertebral arteries are visualized, which are patent. The basilar artery and bilateral posterior cerebral arteries are patent without proximal hemodynamically significant stenosis. Prominent left posterior communicating artery with small or absent left P1 PCA, anatomic variant. Anatomic variants: Detailed above. IMPRESSION: MRI: 1. 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 2. Large remote right MCA territory infarct. MRA: 1. No large vessel occlusion. 2. Severe distal left M1 MCA stenosis. 3. Moderate right paraclinoid ICA stenosis. 4. Attenuated distal right MCA vessels in the region of remote infarct. Electronically Signed   By: Margaretha Sheffield M.D.   On: 07/27/2021 10:06   MR BRAIN WO CONTRAST  Result Date: 07/27/2021 CLINICAL DATA:  Neuro deficit, acute, stroke suspected EXAM: MRI HEAD WITHOUT CONTRAST MRA HEAD WITHOUT CONTRAST TECHNIQUE: Multiplanar, multi-echo pulse sequences of the brain and surrounding structures were acquired  without intravenous contrast. Angiographic images of the Circle of Willis were acquired using MRA technique without intravenous contrast. COMPARISON:  CT head from the same day. MRA August 07, 2005 without report. 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 Associated edema without mass effect. Large remote right MCA territory infarct with encephalomalacia and surrounding gliosis. Evidence of some prior hemorrhage along the posterior aspect of this remote infarct. No evidence of acute hemorrhage. Vascular: See below. Skull and upper cervical spine: Normal marrow signal. Sinuses/Orbits: Mild paranasal sinus mucosal thickening. Unremarkable orbits. Other: No sizable mastoid effusions. MRA HEAD FINDINGS Anterior circulation: Bilateral intracranial ICAs are patent. Moderate right paraclinoid ICA stenosis. Subtle apparent filling defect of the right paraclinoid ICA in the region of stenosis is favored to reflect flow artifact given preserved T2 hypointense flow void on MRI. Bilateral MCAs are patent. Severe distal left M1 MCA stenosis. Attenuated distal right MCA vessels in the region of remote infarct. Bilateral ACAs are patent without proximal hemodynamically significant stenosis. Posterior circulation: Only the distal most intradural vertebral arteries are visualized, which are patent. The basilar artery and bilateral posterior cerebral arteries are patent without proximal hemodynamically significant stenosis. Prominent left posterior communicating artery with small or absent left P1 PCA, anatomic variant. Anatomic variants: Detailed above. IMPRESSION: MRI: 1. 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 2. Large remote right MCA territory infarct. MRA: 1. No large vessel occlusion. 2. Severe distal left M1 MCA stenosis. 3. Moderate right paraclinoid ICA stenosis. 4. Attenuated distal right MCA vessels in the  region of remote infarct. Electronically Signed   By: Margaretha Sheffield M.D.   On: 07/27/2021 10:06   MR BRAIN WO CONTRAST  Result Date: 07/22/2021 CLINICAL DATA:  Neuro deficit, acute, stroke suspected Patient had worsened left-sided facial droop  and altered mental status this morning. Now improving. Rule out acute stroke. EXAM: MRI HEAD WITHOUT CONTRAST TECHNIQUE: Multiplanar, multiecho pulse sequences of the brain and surrounding structures were obtained without intravenous contrast. COMPARISON:  Same day CT head.  MRI head August 14, 2020. FINDINGS: Motion limited study. Brain: Similar large area predominately right MCA territory encephalomalacia. Surrounding gliosis. Similar small remote lacunar infarct in the left caudate. Additional patchy white matter T2/FLAIR hyperintensity, compatible with chronic microvascular disease. No evidence of acute infarct, hydrocephalus, mass lesion, midline shift, extra-axial fluid collection, or or acute hemorrhage. Vascular: Major arterial flow voids are maintained at the skull base. Skull and upper cervical spine: Normal marrow signal. Sinuses/Orbits: Mild paranasal sinus mucosal thickening. Unremarkable orbits. Other: No mastoid effusions. IMPRESSION: 1. No evidence of acute abnormality on this motion limited study. 2. Similar large remote right MCA territory and small left caudate lacunar infarcts. Electronically Signed   By: Margaretha Sheffield M.D.   On: 07/22/2021 13:02   NM Hepatobiliary Liver Func  Result Date: 07/27/2021 CLINICAL DATA:  Right upper quadrant abdominal pain, with sonographic Murphy sign, gallstones, and gallbladder wall thickening on ultrasound. EXAM: NUCLEAR MEDICINE HEPATOBILIARY IMAGING TECHNIQUE: Sequential images of the abdomen were obtained out to 60 minutes following intravenous administration of radiopharmaceutical. RADIOPHARMACEUTICALS:  5.3 mCi Tc-69m  Choletec IV COMPARISON:  Abdominal ultrasound 07/27/2021 FINDINGS: There is satisfactory  uptake of radiopharmaceutical from the blood pool. Biliary activity is visible 15 minutes. Gallbladder activity visible by 20 minutes. Consecutive images show filling of the gallbladder. Bowel activity is shown by 70 minutes. IMPRESSION: 1. Currently the cystic duct is patent, with gallbladder activity visible 20 minutes after injection. The CBD is also patent, with bowel activity visible at 70 minutes and thereafter. Electronically Signed   By: Van Clines M.D.   On: 07/27/2021 15:07   DG Chest Port 1 View  Result Date: 07/27/2021 CLINICAL DATA:  Fall EXAM: PORTABLE CHEST 1 VIEW COMPARISON:  11/13/2020 FINDINGS: Heart and mediastinal contours are within normal limits. No focal opacities or effusions. No acute bony abnormality. No visible rib fracture or pneumothorax. IMPRESSION: No active disease. Electronically Signed   By: Rolm Baptise M.D.   On: 07/27/2021 03:27   EEG adult  Result Date: 07/27/2021 Greta Doom, MD     07/28/2021  9:13 AM History: 81 yo M being evaluated for encephalopathy Sedation: none Technique: This EEG was acquired with electrodes placed according to the International 10-20 electrode system (including Fp1, Fp2, F3, F4, C3, C4, P3, P4, O1, O2, T3, T4, T5, T6, A1, A2, Fz, Cz, Pz). The following electrodes were missing or displaced: none. Background: There is a poorly formed and poorly sustained posterior dominant rhythm achieving a maximal frequency of 9-10 Hz. In addition there is diffuse generalized irregular delta and theta range activity seen throughout the recording. Well formed sleep is not recorded. No epileptiform discharges were seen. Photic stimulation: Physiologic driving is not performed EEG Abnormalities: 1) Generalized irregular slow activity. Clinical Interpretation: This EEG is consistent with a generalized non-specific cerebral dysfunction(encephalopathy). There was no seizure or seizure predisposition recorded on this study. Please note that lack of  epileptiform activity on EEG does not preclude the possibility of epilepsy. Roland Rack, MD [facility] [phone] If 7pm- 7am, please page neurology on call as listed in [facility].   ECHOCARDIOGRAM COMPLETE  Result Date: 07/27/2021    ECHOCARDIOGRAM REPORT   Patient Name:   TORETTO TINGLER Date of Exam: 07/27/2021 Medical Rec #:  [id number]  Height:       71.0 in Accession #:    [id number]     Weight:       213.0 lb Date of Birth:  [birth year]      BSA:          2.166 m Patient Age:    40 years       BP:           144/75 mmHg Patient Gender: M              HR:           86 bpm. Exam Location:  Inpatient Procedure: 2D Echo, Cardiac Doppler, Color Doppler and Intracardiac            Opacification Agent Indications:     Stroke  History:         Patient has no prior history of Echocardiogram examinations.  Sonographer:     Jyl Heinz Referring Phys:  [id number] RONDELL A Vandam Diagnosing Phys: Oswaldo Milian MD IMPRESSIONS  1. Left ventricular ejection fraction, by estimation, is 60 to 65%. The left ventricle has normal function. The left ventricle has no regional wall motion abnormalities. There is mild left ventricular hypertrophy. Left ventricular diastolic parameters are consistent with Grade I diastolic dysfunction (impaired relaxation).  2. Right ventricular systolic function is normal. The right ventricular size is normal. Tricuspid regurgitation signal is inadequate for assessing PA pressure.  3. The mitral valve is grossly normal. No evidence of mitral valve regurgitation. No evidence of mitral stenosis.  4. The aortic valve was not well visualized. Aortic valve regurgitation is not visualized. No aortic stenosis is present. FINDINGS  Left Ventricle: Left ventricular ejection fraction, by estimation, is 60 to 65%. The left ventricle has normal function. The left ventricle has no regional wall motion abnormalities. The left ventricular internal cavity size was normal in size. There is   mild left ventricular hypertrophy. Left ventricular diastolic parameters are consistent with Grade I diastolic dysfunction (impaired relaxation). Right Ventricle: The right ventricular size is normal. Right vetricular wall thickness was not well visualized. Right ventricular systolic function is normal. Tricuspid regurgitation signal is inadequate for assessing PA pressure. Left Atrium: Left atrial size was normal in size. Right Atrium: Right atrial size was normal in size. Pericardium: There is no evidence of pericardial effusion. Presence of epicardial fat layer. Mitral Valve: The mitral valve is grossly normal. No evidence of mitral valve regurgitation. No evidence of mitral valve stenosis. Tricuspid Valve: The tricuspid valve is normal in structure. Tricuspid valve regurgitation is trivial. Aortic Valve: The aortic valve was not well visualized. Aortic valve regurgitation is not visualized. No aortic stenosis is present. Aortic valve peak gradient measures 11.8 mmHg. Pulmonic Valve: The pulmonic valve was not well visualized. Pulmonic valve regurgitation is not visualized. Aorta: The aortic root and ascending aorta are structurally normal, with no evidence of dilitation. IAS/Shunts: The interatrial septum was not well visualized.  LEFT VENTRICLE PLAX 2D LVIDd:         4.20 cm      Diastology LVIDs:         2.80 cm      LV e' medial:    4.79 cm/s LV PW:         1.40 cm      LV E/e' medial:  11.0 LV IVS:        1.20 cm      LV e' lateral:   7.07 cm/s LVOT diam:  2.00 cm      LV E/e' lateral: 7.4 LV SV:         60 LV SV Index:   28 LVOT Area:     3.14 cm  LV Volumes (MOD) LV vol d, MOD A2C: 105.0 ml LV vol d, MOD A4C: 118.0 ml LV vol s, MOD A2C: 36.7 ml LV vol s, MOD A4C: 42.4 ml LV SV MOD A2C:     68.3 ml LV SV MOD A4C:     118.0 ml LV SV MOD BP:      74.1 ml RIGHT VENTRICLE RV Basal diam:  3.50 cm RV Mid diam:    2.70 cm RV S prime:     14.50 cm/s TAPSE (M-mode): 2.2 cm LEFT ATRIUM             Index         RIGHT ATRIUM           Index LA diam:        3.40 cm 1.57 cm/m   RA Area:     15.00 cm LA Vol (A2C):   42.5 ml 19.62 ml/m  RA Volume:   36.30 ml  16.76 ml/m LA Vol (A4C):   66.2 ml 30.57 ml/m LA Biplane Vol: 55.0 ml 25.40 ml/m  AORTIC VALVE AV Area (Vmax): 2.03 cm AV Vmax:        172.00 cm/s AV Peak Grad:   11.8 mmHg LVOT Vmax:      111.00 cm/s LVOT Vmean:     73.500 cm/s LVOT VTI:       0.190 m  AORTA Ao Root diam: 3.80 cm Ao Asc diam:  3.30 cm MITRAL VALVE MV Area (PHT): 4.44 cm    SHUNTS MV Decel Time: 171 msec    Systemic VTI:  0.19 m MV E velocity: 52.50 cm/s  Systemic Diam: 2.00 cm MV A velocity: 75.50 cm/s MV E/A ratio:  0.70 Oswaldo Milian MD Electronically signed by Oswaldo Milian MD Signature Date/Time: 07/27/2021/3:59:23 PM    Final (Updated)    VAS US CAROTID  Result Date: 07/30/2021 Carotid Arterial Duplex Study Patient Name:  GURVEER COLUCCI  Date of Exam:   07/27/2021 Medical Rec #: [id number]       Accession #:    [id number] Date of Birth: 1940-11-03       Patient Gender: M Patient Age:   47 years Exam Location:  [facility] Procedure:      VAS US CAROTID Referring Phys: Joseph Berkshire --------------------------------------------------------------------------------  Indications:       CVA, Carotid artery disease and bilateral endarterectomies                    (right 2006, left 2010). Risk Factors:      Hypertension, past history of smoking, prior CVA. Other Factors:     History of bladder cancer. Comparison Study:  Prior Carotid duplex done 01/30/20 indicated 1-39% ICA                    stenosis bilaterally and patent CEAs. Performing Technologist: Sharion Dove RVS  Examination Guidelines: A complete evaluation includes B-mode imaging, spectral Doppler, color Doppler, and power Doppler as needed of all accessible portions of each vessel. Bilateral testing is considered an integral part of a complete examination. Limited examinations for reoccurring indications may  be performed as noted.  Right Carotid Findings: +----------+--------+--------+--------+------------------+--------+             PSV cm/s  EDV cm/s Stenosis Plaque Description Comments  +----------+--------+--------+--------+------------------+--------+  CCA Prox   78       16                                             +----------+--------+--------+--------+------------------+--------+  CCA Distal 94       19                                             +----------+--------+--------+--------+------------------+--------+  ICA Prox   44       11       1-39%    heterogenous                 +----------+--------+--------+--------+------------------+--------+  ICA Distal 67       20                                             +----------+--------+--------+--------+------------------+--------+  ECA        97       22                                             +----------+--------+--------+--------+------------------+--------+ +----------+--------+-------+--------+-------------------+             PSV cm/s EDV cms Describe Arm Pressure (mmHG)  +----------+--------+-------+--------+-------------------+  Subclavian 167                                            +----------+--------+-------+--------+-------------------+ +---------+--------+--+--------+--+  Vertebral PSV cm/s 67 EDV cm/s 20  +---------+--------+--+--------+--+  Left Carotid Findings: +----------+--------+--------+--------+------------------+------------------+             PSV cm/s EDV cm/s Stenosis Plaque Description Comments            +----------+--------+--------+--------+------------------+------------------+  CCA Prox   148      18                                   intimal thickening  +----------+--------+--------+--------+------------------+------------------+  CCA Distal 108      14                                   intimal thickening  +----------+--------+--------+--------+------------------+------------------+  ICA Prox   48       14       1-39%     heterogenous                           +----------+--------+--------+--------+------------------+------------------+  ICA Distal 92       25                                                       +----------+--------+--------+--------+------------------+------------------+  ECA        123      21                                                       +----------+--------+--------+--------+------------------+------------------+ +----------+--------+--------+--------+-------------------+             PSV cm/s EDV cm/s Describe Arm Pressure (mmHG)  +----------+--------+--------+--------+-------------------+  Subclavian 93                                              +----------+--------+--------+--------+-------------------+ +---------+--------+--+--------+--+  Vertebral PSV cm/s 62 EDV cm/s 17  +---------+--------+--+--------+--+   Summary: Right Carotid: Velocities in the right ICA are consistent with a 1-39% stenosis. Left Carotid: There is no evidence of stenosis in the left ICA. Vertebrals:  Bilateral vertebral arteries demonstrate antegrade flow. Subclavians: Normal flow hemodynamics were seen in bilateral subclavian              arteries. *See table(s) above for measurements and observations.  Electronically signed by Antony Contras MD on 07/30/2021 at 11:56:02 AM.    Final    VAS [place] TRANSCRANIAL DOPPLER  Result Date: 07/30/2021  Transcranial Doppler Patient Name:  CARRELL RAHMANI  Date of Exam:   07/27/2021 Medical Rec #: [id number]       Accession #:    [id number] Date of Birth: August 08, 1940       Patient Gender: M Patient Age:   37 years Exam Location:  [facility] Procedure:      VAS [place] TRANSCRANIAL DOPPLER Referring Phys: PRAMOD SETHI --------------------------------------------------------------------------------  Indications: Stroke. Comparison Study: No prior study Performing Technologist: Sharion Dove RVS  Examination Guidelines: A complete evaluation includes B-mode imaging, spectral Doppler, color  Doppler, and power Doppler as needed of all accessible portions of each vessel. Bilateral testing is considered an integral part of a complete examination. Limited examinations for reoccurring indications may be performed as noted.  +----------+-------------+----------+-----------+-------+  RIGHT TCD  Right VM (cm) Depth (cm) Pulsatility Comment  +----------+-------------+----------+-----------+-------+  MCA            51.00                   1.29              +----------+-------------+----------+-----------+-------+  ACA           -36.00                   1.20              +----------+-------------+----------+-----------+-------+  Term ICA       51.00                   1.28              +----------+-------------+----------+-----------+-------+  PCA            25.00                   1.14              +----------+-------------+----------+-----------+-------+  Opthalmic      30.00  1.30              +----------+-------------+----------+-----------+-------+  ICA siphon     22.00                   1.22              +----------+-------------+----------+-----------+-------+  Vertebral      36.00                   1.00              +----------+-------------+----------+-----------+-------+  +----------+------------+----------+-----------+------------------+  LEFT TCD   Left VM (cm) Depth (cm) Pulsatility      Comment        +----------+------------+----------+-----------+------------------+  MCA           101.00       4.30       1.56                         +----------+------------+----------+-----------+------------------+  ACA                                            Unable to insonate  +----------+------------+----------+-----------+------------------+  Term ICA      55.00                   1.51                         +----------+------------+----------+-----------+------------------+  PCA           34.00                   1.16                          +----------+------------+----------+-----------+------------------+  Opthalmic     25.00                   1.26                         +----------+------------+----------+-----------+------------------+  ICA siphon    23.00                   1.58                         +----------+------------+----------+-----------+------------------+  Vertebral     -21.00                  1.52                         +----------+------------+----------+-----------+------------------+  +------------+------+-------+               VM cm  Comment  +------------+------+-------+  Prox Basilar -27.00          +------------+------+-------+ Summary:  Elevated left middle cerebral artery mean flow velocities suggest mild stenosis. 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 *See table(s) above for TCD measurements and observations.  Diagnosing physician: Antony Contras MD Electronically signed by Antony Contras MD on 07/30/2021 at 11:58:01 AM.    Final    US ABDOMEN LIMITED RUQ (LIVER/GB)  Result Date: 07/27/2021 CLINICAL DATA:  Abdominal pain with elevated LFTs. EXAM: ULTRASOUND ABDOMEN LIMITED  RIGHT UPPER QUADRANT COMPARISON:  CT without contrast 04/28/2021 FINDINGS: Gallbladder: The gallbladder is filled with stones, largest measurable stone approaching 1.5 cm. There is a thickened anterior free wall measuring 5 mm with a positive sonographic Murphy sign. This is concerning for acute cholecystitis. Common bile duct: Diameter: Unable to visualize. Liver: No focal lesion identified. There is increased hepatic echogenicity consistent with steatosis. Portal vein is patent on color Doppler imaging with normal direction of blood flow towards the liver. Other: 5 cm simple appearing cyst again noted in the upper pole right kidney. No free fluid is evident. IMPRESSION: 1. Mild thickening of the gallbladder with positive sonographic Murphy sign and multiple stones. Findings concerning for acute  cholecystitis. No pericholecystic fluid. 2. Fatty liver. 3. Unable to visualize the bile ducts due to shadowing by the gallbladder and due to bowel gas. 4. 5 cm simple cyst right kidney. Electronically Signed   By: Telford Nab M.D.   On: 07/27/2021 06:10     PHYSICAL EXAM  Temp:  [98 F (36.7 C)-98.3 F (36.8 C)] 98.3 F (36.8 C) (02/18 0403) Pulse Rate:  [80-99] 80 (02/18 0403) Resp:  [16] 16 (02/18 0403) BP: (150-171)/(75-82) 150/82 (02/18 0403) SpO2:  [91 %-97 %] 97 % (02/18 0403)  General - well nourished, well developed, in no apparent distress.     Ophthalmologic - fundi not visualized due to noncooperation.     Cardiovascular - regular rhythm and rate   Neuro - sleepy but easily arousable and participate exam without difficulty, orientated to place, time and people, but not to age. No aphasia, paucity of speech with mild dysarthria, following all simple commands. Able to name and repeat. No gaze palsy, tracking bilaterally, visual field full, PERRL.  Chronic left facial droop with mild left proptosis. Tongue midline.  Left upper extremity 3+/5, right upper extremity 4/5, bilateral lower extremity proximal 3/5, distally right 4+/5, left 4/5.  Sensation symmetrical bilaterally, b/l FTN grossly intact however slow on the left, gait not tested.    ASSESSMENT/PLAN William [name] is a 81 y.o. male with PMH of right MCA stroke in 2006, carotid stenosis status post bilateral CEA first one on the right in 2006 and second one "several years later", CKD 3, hypertension and hyperlipidemia admitted on 07/27/2021 for lethargy, nausea vomiting and acute right-sided weakness.  MRI found to have left BG and CR infarct.  MRA head showed left M1 high-grade stenosis and moderate right ICA stenosis.  Carotid Doppler unremarkable.  EF 60 to 65%.  EEG no seizure.  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  Discharged to CIR on 07/29/2021 with DAPT for 3 months and  fenofibrate given statin allergy.   Patient has been working with PT/OT, doing well.  While working with OT 2/17 standing position feeling lightheaded and dizzy, and lowered himself to the floor in a controlled fashion with OT assistance.  Did not hit head. CT stat done showed evolving left BG/CR infarct and punctate hemorrhagic conversion.  Patient neuro stable, no significant change. Repeat CT 2/18 stable small hemorrhagic conversion.   Stroke:  left basal ganglia and corona radiata moderate sized infarct with small HT likely secondary to left M1 high-grade stenosis Code Stroke CT head: acute infarct at L basal ganglia and corona radiata as well as remote R MCA infarcts. MRI  acute infarct at L basal ganglia and corona radiata as well as remote R MCA infarcts. MRA  severe distal L M1 MCA  stenosis and moderate R ICA stenosis.  Carotid Doppler  1-39% stenosis of R ICA and no stenosis of L ICA 2D Echo LVEF 60 to 65%; mild LVH with grade 1 diastolic dysfunction EEG: Generalized encephalopathy, no seizures nor epileptiform activity LDL 115 HgbA1c 5.3 VTE prophylaxis - SCDs aspirin 325 mg daily EC prior to admission, now on aspirin 81 mg daily and clopidogrel 75 mg daily x 3 months then continue Plavix alone Therapy recommendations:  CIR   Hemorrhagic conversion CT 2/17 evolving left BG/CR infarct and punctate hemorrhagic conversion. Repeat CT 2/18 stable HT Given small HT with no significant neuro change, Ok to continue ASA and plavix   Encephalopathy  B/l hemisphere involvement prone to have encephalopathy Wax and wane mental status 2/18 per son Treat underlining condition U Cx neg, UA WBC 11-20 improved - no keflex.  Hypertension Orthostatic hypotension Home meds:  Norvasc 10 mg, Lisinopril-HCTZ 10-12.5 mg Orthostatic vital signs suggesting orthostatic hypotension On IVF On TED hose Long-term BP goal normotensive   Hyperlipidemia Home meds:  Fenofibrate 160 mg, resumed in  hospital LDL 115, goal < 70 Patient with statin allergy --> myalgia On fenofibrate 160 now   Diabetes type II Assessment Home meds:  N/a HgbA1c 5.3, goal < 7.0 CBGs SSI   Other Stroke Risk Factors Advanced Age >/= 34  Former cigarette smoker; quit 16 years ago Obesity, There is no height or weight on file to calculate BMI., BMI >/= 30 associated with increased stroke risk, recommend weight loss, diet and exercise as appropriate  Hx stroke/TIA - right large MCA chronic infarct Coronary artery disease   Other Active Problems Mild baseline dementia  Hospital day # 3  Neurology will sign off. Please call with questions. Pt will follow up with Dr. Donnella Sham in about 4 weeks. Thanks for the consult.   Rosalin Hawking, MD PhD Stroke Neurology 08/01/2021 6:21 PM    To contact Stroke Continuity provider, please refer to [email or web address] After hours, contact General Neurology

## 2021-08-01 NOTE — IPOC Note (Signed)
Overall Plan of Care [facility]) Patient Details Name: William [name] MRN: [id number] DOB: [birth year]  Admitting Diagnosis: Basal ganglia infarction [name])  Hospital Problems: Principal Problem:   Basal ganglia infarction [facility])     Functional Problem List: Nursing Bladder, Bowel, Safety, Medication Management, Pain, Endurance  PT Balance, Behavior, Endurance, Motor, Nutrition, Pain, Perception, Safety, Sensory, Skin Integrity  OT Behavior, Balance, Perception, Cognition, Edema, Motor, Endurance, Safety  SLP Cognition, Linguistic  TR         Basic ADLs: OT Grooming, Bathing, Dressing, Toileting     Advanced  ADLs: OT       Transfers: PT Bed Mobility, Bed to Chair, Car, Manufacturing systems engineer, Metallurgist: PT Ambulation, Emergency planning/management officer, Stairs     Additional Impairments: OT Fuctional Use of Upper Extremity  SLP Communication, Social Cognition comprehension, expression Social Interaction, Attention  TR      Anticipated Outcomes Item Anticipated Outcome  Self Feeding n/a  Swallowing      Basic self-care  min A overall  Toileting  min A   Bathroom Transfers contact guard  Bowel/Bladder  manage bowel and bladder w mod I  Transfers  CGA  Locomotion  CGA  Communication  Min A  Cognition  Min A  Pain  pain at or below level 4 w prns  Safety/Judgment  manage safety w cues   Therapy Plan: PT Intensity: Minimum of 1-2 x/day ,45 to 90 minutes PT Frequency: 5 out of 7 days PT Duration Estimated Length of Stay: 2-2.5 weeks OT Intensity: Minimum of 1-2 x/day, 45 to 90 minutes OT Frequency: 5 out of 7 days OT Duration/Estimated Length of Stay: ~ 10-14 days SLP Intensity: Minumum of 1-2 x/day, 30 to 90 minutes SLP Frequency: 3 to 5 out of 7 days SLP Duration/Estimated Length of Stay: 2 weeks   Due to the current state of emergency, patients may not be receiving their 3-hours of Medicare-mandated therapy.   Team Interventions: Nursing  Interventions Bladder Management, Disease Management/Prevention, Medication Management, Discharge Planning, Pain Management, Bowel Management, Patient/Family Education  PT interventions Ambulation/gait training, DME/adaptive equipment instruction, Psychosocial support, UE/LE Strength taining/ROM, Balance/vestibular training, Functional electrical stimulation, Skin care/wound management, UE/LE Coordination activities, Cognitive remediation/compensation, Functional mobility training, Splinting/orthotics, Visual/perceptual remediation/compensation, Community reintegration, Neuromuscular re-education, IT trainer, Wheelchair propulsion/positioning, Discharge planning, Pain management, Therapeutic Activities, Disease management/prevention, Patient/family education, Therapeutic Exercise  OT Interventions Balance/vestibular training, Functional electrical stimulation, Discharge planning, Pain management, Self Care/advanced ADL retraining, Therapeutic Activities, Cognitive remediation/compensation, Disease mangement/prevention, Functional mobility training, Patient/family education, Skin care/wound managment, Community reintegration, Engineer, drilling, Neuromuscular re-education, Psychosocial support, Splinting/orthotics, UE/LE Strength taining/ROM  SLP Interventions Cognitive remediation/compensation, Internal/external aids, Speech/Language facilitation, Therapeutic Activities, Environmental controls, Cueing hierarchy, Functional tasks, Patient/family education  TR Interventions    SW/CM Interventions Discharge Planning, Psychosocial Support, Patient/Family Education   Barriers to Discharge MD  Medical stability  Nursing Decreased caregiver support 1 level 1 ste w spouse who works 20 hours/week  PT Decreased caregiver support, Insurance underwriter for SNF coverage h/o previous CVA  OT      SLP      SW Decreased caregiver support, Insurance underwriter for SNF coverage     Team Discharge  Planning: Destination: PT-Home ,OT- Home , SLP-Home Projected Follow-up: PT-Home health PT, 24 hour supervision/assistance, OT-  Home health OT, SLP-24 hour supervision/assistance, Home Health SLP Projected Equipment Needs: PT-To be determined, OT- Tub/shower bench, SLP-None recommended by SLP Equipment Details: PT- , OT-  Patient/family involved in discharge planning: PT-  Patient, Family Midwife,  OT-Patient, Family member/caregiver, SLP-Patient, Family member/caregiver  MD ELOS: 21 days Medical Rehab Prognosis:  Excellent Assessment: William [name] is a 81 year old man admitted to CIR with functional deficits secondary to functional deficits secondary to left basal ganglia and corona radiata infarct, history of mild cognitive impairment secondary to old stroke. Medications are being managed, and labs and vitals are being monitored regularly.     See Team Conference Notes for weekly updates to the plan of care

## 2021-08-01 NOTE — Progress Notes (Signed)
Occupational Therapy Session Note  Patient Details  Name: William [name] MRN: [id number] Date of Birth: Oct 29, 1940  Today's Date: 08/01/2021 OT Individual Time: 1419-1500 OT Individual Time Calculation (min): 41 min    Short Term Goals: Week 1:  OT Short Term Goal 1 (Week 1): Pt will performed 2/3 toileting tasks with contact guard OT Short Term Goal 2 (Week 1): Pt will don shirt with mod A OT Short Term Goal 3 (Week 1): Pt will perform toilet transfer (on/off) with contact guard OT Short Term Goal 4 (Week 1): Pt will use bilateral hands in all functional tasks with mod VC OT Short Term Goal 5 (Week 1): Pt will stand at the sink to perform 2 functional tasks with contact guard for balance  Skilled Therapeutic Interventions/Progress Updates:    Pt greeted in bed, some c/o pain in the ribs and rest/repositioning utilized during tx to address therapeutically. Supine<sit completed with Mod A, increased ease compared to session this AM. While sitting EOB, worked on sitting balance, functional cognition/motor planning, and sustained attention to task in the context of functional activities. Pt was set up with hand washing using sanitizer, oral care, and lotion application. Pt with perseverative tendencies, dispensing >5 pumps/squeezes of sanitizer/lotion and also when brushing teeth. Hand over hand for continuing to next step of motor plan and also to gently brush teeth. Pt tended to profusely scrub teeth. Noted difficultly following 2 step instruction when applying lotion to upper arms. Worked on pt increasing functional use of Lt when reaching for needed items and washing face with wash cloth, OT gently holding pts Rt hand at this time. Occasional Min A for sitting balance due to pt seeming to concentrate on his perseverative speech which impacted his awareness of balance. +2 for scooting up towards [place], progressing to Mod A of 1 once pt got the hang of the motor pattern. OT sat in a w/c parked in front  of pt and had him place hands on armrests to promote forward weight shift in order to scoot buttocks. +2 for returning to supine. Left pt in the bed at close of session, all needs within reach and bed alarm set. MD just arriving to converse with pt and son.   Therapy Documentation Precautions:  Precautions Precautions: Fall Precaution Comments: L 11th and 12th rib fxs Restrictions Weight Bearing Restrictions: No General: General PT Missed Treatment Reason: Patient fatigue;Other (Comment) (pt fatigued being just returned from imaging) ADL: ADL Eating: Supervision/safety Grooming: Minimal assistance Upper Body Bathing: Minimal assistance Where Assessed-Upper Body Bathing: Shower Lower Body Bathing: Minimal assistance Where Assessed-Lower Body Bathing: Shower Upper Body Dressing: Maximal assistance Where Assessed-Upper Body Dressing: Wheelchair Lower Body Dressing: Maximal assistance Where Assessed-Lower Body Dressing: Wheelchair Toileting: Maximal assistance Where Assessed-Toileting: Toilet, Recruitment consultant Transfer: Environmental education officer: Moderate assistance Social research officer, government Method: Heritage manager: Radio broadcast assistant  Therapy/Group: Individual Therapy  Irving Bloor A Natanael Saladin 08/01/2021, 3:56 PM

## 2021-08-02 DIAGNOSIS — G9340 Encephalopathy, unspecified: Secondary | ICD-10-CM

## 2021-08-02 LAB — GLUCOSE, CAPILLARY
Glucose-Capillary: 101 mg/dL — ABNORMAL HIGH (ref 70–99)
Glucose-Capillary: 98 mg/dL (ref 70–99)
Glucose-Capillary: 112 mg/dL — ABNORMAL HIGH (ref 70–99)
Glucose-Capillary: 117 mg/dL — ABNORMAL HIGH (ref 70–99)

## 2021-08-02 NOTE — Progress Notes (Addendum)
Physical Therapy Session Note  Patient Details  Name: William [name] MRN: [id number] Date of Birth: 02-03-1941  Today's Date: 08/02/2021 PT Individual Time: 1020-1130 PT Individual Time Calculation (min): 70 min   Short Term Goals: Week 1:  PT Short Term Goal 1 (Week 1): Patient will complete supine <> sit with MinA PT Short Term Goal 2 (Week 1): Patient will compelte sit <> stand with MinA and LRAD PT Short Term Goal 3 (Week 1): Patient will ambulate >46ft with LRAD and MinA PT Short Term Goal 4 (Week 1): Patient will initiate stair training Week 2:    Week 3:     Skilled Therapeutic Interventions/Progress Updates:    Pt initially supine w/wife at bedside.  Pt requires max assist and heavy curing for rolling for brief change, pericare, donning pants.  Pt c/o rib pain but cannot apply #.  Treatment to tolerance, care taken to avoid pressure to R trunk.  Pt supine to R side to sit w/multimodal cues, additional time for proceessing.  Scoots w/cues/additional time. Sit to stand to RW w/multimodal cueing and min assist.  Short distance gait/turn/sit to wc w/RW w/min assist, max cueing for sequendincg w/transition to wc.  Pt transported to hall. Gait 65 ft x 2 w/RW, cues to increase step length, cues for upright posture, assist to manage AD w/turns.  Pt w/tendency to shuffle w/decreased step length L>R.  Able to improve step length and fluidity of gait + cadence w/cues to "take big steps" but reverts to shuffling when distracted.    Pt transported to room.   Sit to stand to RW w/min to cga, additional time In standing worked on "hi marching" w/RW support x 12, frequent cues to consistently maximize ROM   Overhead reach w/RUE w/no UE support on walker and cga x 10 Alternating long step to target leading w/heel x 20  Pt left oob in wc w/alarm belt set and needs in reach   Therapy Documentation Precautions:  Precautions Precautions: Fall Precaution Comments: L 11th and 12th rib  fxs Restrictions Weight Bearing Restrictions: No    Therapy/Group: Individual Therapy William [name], PT   William [name] 08/02/2021, 12:47 PM

## 2021-08-02 NOTE — Progress Notes (Signed)
PROGRESS NOTE   Subjective/Complaints:  Doing much better this am , wife at bedside, encouraging fluids.  Per RN pt slept well, became agitated 5-6p (per wife takes home Ativan at that time) CBZ level wnl  ROS: Occ cough , no CP or SOB   Objective:   DG Chest 2 View  Result Date: 08/01/2021 CLINICAL DATA:  Dysphagia EXAM: CHEST - 2 VIEW COMPARISON:  07/27/2021 FINDINGS: Unchanged cardiomediastinal silhouette. Small left pleural effusion. Adjacent left basilar opacities. No visible pneumothorax. Mild left shoulder osteoarthritis. No acute osseous abnormality. IMPRESSION: Small left pleural effusion. Adjacent left basilar opacities, likely atelectasis. Electronically Signed   By: Maurine Simmering M.D.   On: 08/01/2021 13:28   CT HEAD WO CONTRAST (5MM)  Result Date: 08/01/2021 CLINICAL DATA:  Stroke, follow up EXAM: CT HEAD WITHOUT CONTRAST TECHNIQUE: Contiguous axial images were obtained from the base of the skull through the vertex without intravenous contrast. RADIATION DOSE REDUCTION: This exam was performed according to the departmental dose-optimization program which includes automated exposure control, adjustment of the mA and/or kV according to patient size and/or use of iterative reconstruction technique. COMPARISON:  CT February 17, 23. FINDINGS: Brain: Similar appearance of evolving infarct in the left basal ganglia. Similar 5 mm focus of hemorrhage posteriorly. No progressive mass effect. Similar large remote right MCA territory infarct with encephalomalacia. Similar partial effacement of the left lateral ventricle with ex vacuo ventricular dilation right lateral ventricle. No hydrocephalus. No mass lesion. Vascular: Calcific intracranial atherosclerosis. Skull: No acute fracture. Sinuses/Orbits: Small retention cyst or mucosal thickening in the inferior right maxillary sinus. Otherwise, clear sinuses. Unremarkable orbits. Other: No  mastoid effusions. IMPRESSION: 1. Similar appearance of evolving infarct in the left basal ganglia. Similar 5 mm focus of hemorrhage posteriorly. No progressive mass effect. 2. Large remote right MCA territory infarct. Electronically Signed   By: Margaretha Sheffield M.D.   On: 08/01/2021 13:13   CT HEAD WO CONTRAST (5MM)  Result Date: 07/31/2021 CLINICAL DATA:  Recent CVA EXAM: CT HEAD WITHOUT CONTRAST TECHNIQUE: Contiguous axial images were obtained from the base of the skull through the vertex without intravenous contrast. RADIATION DOSE REDUCTION: This exam was performed according to the departmental dose-optimization program which includes automated exposure control, adjustment of the mA and/or kV according to patient size and/or use of iterative reconstruction technique. COMPARISON:  07/27/2021 CT and MRI FINDINGS: Brain: There has been continued evolution of the left basal ganglia and corona radiata infarct seen previously. In the periphery of the infarct bed there is a 5 mm hyperdense focus consistent with punctate hemorrhage. No significant mass effect. Chronic encephalomalacia throughout the right cerebral hemisphere from prior right MCA territory infarct. Lateral ventricles and midline structures are stable. No acute extra-axial fluid collections. No mass effect or midline shift. Vascular: Stable atherosclerosis.  No hyperdense vessel. Skull: Normal. Negative for fracture or focal lesion. Sinuses/Orbits: Minimal polypoid mucosal thickening right maxillary sinus. Remaining sinuses are clear. Other: None. IMPRESSION: 1. Continued evolution of the left basal ganglia and corona radiata infarct seen previously. New small 5 mm focal hemorrhage within the periphery of the infarct bed. 2. No mass effect or midline shift. 3. Large remote previous  right MCA territory infarct and resulting encephalomalacia. Critical Value/emergent results were called by telephone at the time of interpretation on 07/31/2021 at 4:04  pm to provider [facility] PA , who verbally acknowledged these results. Electronically Signed   By: Randa Ngo M.D.   On: 07/31/2021 16:10   No results for input(s): WBC, HGB, HCT, PLT in the last 72 hours.  Recent Labs    08/01/21 0558  NA 135  K 4.0  CL 105  CO2 20*  GLUCOSE 113*  BUN 26*  CREATININE 2.07*  CALCIUM 8.5*     Intake/Output Summary (Last 24 hours) at 08/02/2021 0855 Last data filed at 08/02/2021 0411 Gross per 24 hour  Intake 120 ml  Output 3100 ml  Net -2980 ml         Physical Exam: Vital Signs Blood pressure (!) 170/74, pulse 75, temperature 98.6 F (37 C), temperature source Oral, resp. rate 16, height 5\' 11"  (1.803 m), weight 95.2 kg, SpO2 97 %.  General: No acute distress Mood and affect are appropriate Heart: Regular rate and rhythm no rubs murmurs or extra sounds Lungs: diminished BS at bases, breathing unlabored, no rales or wheezes Abdomen: Positive bowel sounds, soft nontender to palpation, nondistended Extremities: No clubbing, cyanosis, or edema Skin: No evidence of breakdown, no evidence of rash Neuro:  Eyes without evidence of nystagmus  Tone is normal without evidence of spasticity Left neglect noted (old R MCA infarct)  Neuro: Alert and oriented to person place , not time. RUE 4/5, LUE 4/5 proximally and 4/5 distally, bilateral lower extremities 3/5.  Able to tolerate standing.      Assessment/Plan: 1. Functional deficits which require 3+ hours per day of interdisciplinary therapy in a comprehensive inpatient rehab setting. Physiatrist is providing close team supervision and 24 hour management of active medical problems listed below. Physiatrist and rehab team continue to assess barriers to discharge/monitor patient progress toward functional and medical goals  Care Tool:  Bathing    Body parts bathed by patient: Right arm, Left arm, Chest, Abdomen, Front perineal area, Buttocks, Right upper leg, Left upper leg, Face    Body parts bathed by helper: Right lower leg, Left lower leg     Bathing assist Assist Level: Minimal Assistance - Patient > 75%     Upper Body Dressing/Undressing Upper body dressing   What is the patient wearing?: Pull over shirt    Upper body assist Assist Level: Maximal Assistance - Patient 25 - 49%    Lower Body Dressing/Undressing Lower body dressing      What is the patient wearing?: Pants, Underwear/pull up     Lower body assist Assist for lower body dressing: Maximal Assistance - Patient 25 - 49%     Toileting Toileting    Toileting assist Assist for toileting: Total Assistance - Patient < 25%     Transfers Chair/bed transfer  Transfers assist     Chair/bed transfer assist level: 2 Helpers     Locomotion Ambulation   Ambulation assist      Assist level: Moderate Assistance - Patient 50 - 74% Assistive device: Walker-rolling Max distance: 25   Walk 10 feet activity   Assist     Assist level: Moderate Assistance - Patient - 50 - 74% Assistive device: Walker-rolling   Walk 50 feet activity   Assist Walk 50 feet with 2 turns activity did not occur: Safety/medical concerns         Walk 150 feet activity   Assist  Walk 150 feet activity did not occur: Safety/medical concerns         Walk 10 feet on uneven surface  activity   Assist Walk 10 feet on uneven surfaces activity did not occur: Safety/medical concerns         Wheelchair     Assist Is the patient using a wheelchair?: Yes Type of Wheelchair: Manual    Wheelchair assist level: Total Assistance - Patient < 25% Max wheelchair distance: 150    Wheelchair 50 feet with 2 turns activity    Assist        Assist Level: Total Assistance - Patient < 25%   Wheelchair 150 feet activity     Assist      Assist Level: Total Assistance - Patient < 25%   Blood pressure (!) 170/74, pulse 75, temperature 98.6 F (37 C), temperature source Oral, resp. rate  16, height 5\' 11"  (1.803 m), weight 95.2 kg, SpO2 97 %.  Medical Problem List and Plan: 1. 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  Continue CIR 2.  Impaired mobility:  -DVT/anticoagulation:  Mechanical: Sequential compression devices, below knee Bilateral lower extremities; start Lovenox.              -antiplatelet therapy: aspirin 3. Rib fracture pain: Tylenol, tramadol as needed. Start lidocaine patch. Discussed that this should be applied directly over area of pain 4. Depression: Start trazodone 50mg  HS LCSW asked to evaluate and provide emotional support             --continue carbamazepine              -antipsychotic agents: Not applicable 5. Neuropsych: This patient is not capable of making decisions on his own behalf. 6. Skin/Wound Care: Routine skin care checks 7. 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 cont IVF at noc repeat BMET in am  --Monitor urine output and chemistries 16: Transaminitis: Monitor chemistries 17: Hypertension: Continue Norvasc and hydralazine             -- Home Zestoretic held secondary to kidney function. Discussed this with patient.  18:  Hyperlipidemia: Continue fenofibrate 19: Bilateral carotid artery stenosis: Asymptomatic             -- Status post bilateral carotid endarterectomies 20: Anemia, chronic disease, hemodilution etiologies likely: Monitor CBC             -- Fecal occult blood negative 2/13             -- Colonoscopy 2016, polyps.  Recall 5 years 21. Prediabetes: check CBGs AC/HS.  22. Hypokalemia: supplement 59meq on 2/17.  23. Suboptimal vitamin D level: continue 2000U daily.  24. Insomnia: start trazodone 50mg  HS. Start sleep chart. 25. Worsening of cognitive status with no new focal weakness, recent labwork and vitals unremarkable , unremarkable CXR, per Dr Erlinda Hong repeat CT head twith small punctate hemorrhage, AMS likely multifactorial , hx mild dementia, on benzo, bilateral infarcts  LOS: 4 days A FACE TO FACE EVALUATION WAS PERFORMED  Charlett Blake 08/02/2021, 8:55 AM

## 2021-08-02 NOTE — Progress Notes (Signed)
Speech Language Pathology Daily Session Note  Patient Details  Name: William [name] MRN: [id number] Date of Birth: [birth year]  Today's Date: 08/02/2021 SLP Individual Time: 4982-6415 SLP Individual Time Calculation (min): 45 min  Short Term Goals: Week 1: SLP Short Term Goal 1 (Week 1): Patient will demonstrate sustained attention to functional tasks for 20 mintues with Mod verbal cues for redirection. SLP Short Term Goal 2 (Week 1): Patient will utilize external aids for orientation to place, time and situation with Mod verbal cues. SLP Short Term Goal 3 (Week 1): Patient will utilize an increaesd vocal intensity at the word level to achieve ~75% intelligibility with Max A multimodal cues. SLP Short Term Goal 4 (Week 1): Patient will demonstrate improved word-finding at the phrase level with Mod A multitmodal cues. SLP Short Term Goal 5 (Week 1): Patient will self-monitor and correct errors during verbal expression with Mod A multimodal cues.  Skilled Therapeutic Interventions:   Pt was seen in room for skilled ST services with focus on cognition. Wife was present for entirety of session. SLP provided edu related to what SLP was working on and why (orientation). SLP provided pt with calendar (taped on tray table due to vision) and encouraged wife to use calendar for orientation reinforcement. Pt was unable to provide month or year; however, pt was able to share his DOB (which wife reported he was unable to earlier). SLP utilized spaced retrieval with written visual to A with pt recalling month and year. Pt was unable to recall items more than 1 minute at this time. Pt exhibited perseverations of SLP speech throughout session. Pt was very sensitive to any vocabulary used with concerns about his cognition. SLP encouraged and reassured pt throughout about allowing time for the brain to heal. SLP edu wife and pt on stroke and spontaneous recovery. SLP observed softer voice during session; however, with  repositioning, pt's voice was at a more appropriate level. SLP asked pt if it hurt to take a deep breath, and pt reported ,"yes". SLP suspects some vocal intensity may be related to pain in ribs vs. Stroke. Cont monitoring. Pt was left in room with PT.   Pain Pain Assessment Faces Pain Scale: Hurts a little bit Pain Location: Rib cage Pain Descriptors / Indicators: Aching Pain Intervention(s): Repositioned  Therapy/Group: Individual Therapy  Verdene Lennert 08/02/2021, 12:34 PM

## 2021-08-03 ENCOUNTER — Ambulatory Visit: Payer: PPO | Admitting: Podiatry

## 2021-08-03 DIAGNOSIS — G214 Vascular parkinsonism: Secondary | ICD-10-CM

## 2021-08-03 DIAGNOSIS — R251 Tremor, unspecified: Secondary | ICD-10-CM

## 2021-08-03 LAB — GLUCOSE, CAPILLARY
Glucose-Capillary: 118 mg/dL — ABNORMAL HIGH (ref 70–99)
Glucose-Capillary: 119 mg/dL — ABNORMAL HIGH (ref 70–99)
Glucose-Capillary: 103 mg/dL — ABNORMAL HIGH (ref 70–99)
Glucose-Capillary: 116 mg/dL — ABNORMAL HIGH (ref 70–99)

## 2021-08-03 NOTE — Progress Notes (Signed)
[facility] [employer] and Living Will copies are in chart.

## 2021-08-03 NOTE — Progress Notes (Signed)
PROGRESS NOTE   Subjective/Complaints: Wife notes increased lethargy this morning, but he later participated well in physical therapy with Christian ambulating 75 feet.   ROS: Occ cough , no CP or SOB, +tremor.  Objective:   No results found. No results for input(s): WBC, HGB, HCT, PLT in the last 72 hours.  Recent Labs    08/01/21 0558  NA 135  K 4.0  CL 105  CO2 20*  GLUCOSE 113*  BUN 26*  CREATININE 2.07*  CALCIUM 8.5*   No intake or output data in the 24 hours ending 08/03/21 1751      Physical Exam: Vital Signs Blood pressure 137/69, pulse 72, temperature (!) 97.4 F (36.3 C), temperature source Oral, resp. rate 17, height 5\' 11"  (1.803 m), weight 95.2 kg, SpO2 97 %.  General: No acute distress, BMI 29.27 Mood and affect are appropriate Heart: Regular rate and rhythm no rubs murmurs or extra sounds Lungs: diminished BS at bases, breathing unlabored, no rales or wheezes Abdomen: Positive bowel sounds, soft nontender to palpation, nondistended Extremities: No clubbing, cyanosis, or edema Skin: No evidence of breakdown, no evidence of rash Neuro:  Eyes without evidence of nystagmus  Tone is normal without evidence of spasticity Left neglect noted (old R MCA infarct)  Neuro: Alert and oriented to person place , not time. RUE 4/5, LUE 4/5 proximally and 4/5 distally, bilateral lower extremities 3/5.  Able to tolerate standing.      Assessment/Plan: 1. Functional deficits which require 3+ hours per day of interdisciplinary therapy in a comprehensive inpatient rehab setting. Physiatrist is providing close team supervision and 24 hour management of active medical problems listed below. Physiatrist and rehab team continue to assess barriers to discharge/monitor patient progress toward functional and medical goals  Care Tool:  Bathing    Body parts bathed by patient: Right arm, Left arm, Chest, Abdomen,  Front perineal area, Buttocks, Right upper leg, Left upper leg, Face   Body parts bathed by helper: Right lower leg, Left lower leg     Bathing assist Assist Level: Minimal Assistance - Patient > 75%     Upper Body Dressing/Undressing Upper body dressing   What is the patient wearing?: Pull over shirt    Upper body assist Assist Level: Maximal Assistance - Patient 25 - 49%    Lower Body Dressing/Undressing Lower body dressing      What is the patient wearing?: Pants, Underwear/pull up     Lower body assist Assist for lower body dressing: Maximal Assistance - Patient 25 - 49%     Toileting Toileting    Toileting assist Assist for toileting: Total Assistance - Patient < 25%     Transfers Chair/bed transfer  Transfers assist     Chair/bed transfer assist level: 2 Helpers     Locomotion Ambulation   Ambulation assist      Assist level: Moderate Assistance - Patient 50 - 74% Assistive device: Walker-rolling Max distance: 25   Walk 10 feet activity   Assist     Assist level: Moderate Assistance - Patient - 50 - 74% Assistive device: Walker-rolling   Walk 50 feet activity   Assist Walk 50 feet  with 2 turns activity did not occur: Safety/medical concerns         Walk 150 feet activity   Assist Walk 150 feet activity did not occur: Safety/medical concerns         Walk 10 feet on uneven surface  activity   Assist Walk 10 feet on uneven surfaces activity did not occur: Safety/medical concerns         Wheelchair     Assist Is the patient using a wheelchair?: Yes Type of Wheelchair: Manual    Wheelchair assist level: Total Assistance - Patient < 25% Max wheelchair distance: 150    Wheelchair 50 feet with 2 turns activity    Assist        Assist Level: Total Assistance - Patient < 25%   Wheelchair 150 feet activity     Assist      Assist Level: Total Assistance - Patient < 25%   Blood pressure 137/69, pulse  72, temperature (!) 97.4 F (36.3 C), temperature source Oral, resp. rate 17, height 5\' 11"  (1.803 m), weight 95.2 kg, SpO2 97 %.  Medical Problem List and Plan: 1. 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  Continue CIR 2.  Impaired mobility:  -DVT/anticoagulation:  Mechanical: Sequential compression devices, below knee Bilateral lower extremities; start Lovenox.              -antiplatelet therapy: aspirin 3. Rib fracture pain: Tylenol, tramadol as needed. Start lidocaine patch. Discussed that this should be applied directly over area of pain 4. Depression: Start trazodone 50mg  HS LCSW asked to evaluate and provide emotional support             --continue carbamazepine              -antipsychotic agents: Not applicable 5. Neuropsych: This patient is not capable of making decisions on his own behalf. 6. Skin/Wound Care: Routine skin care checks 7. 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 cont IVF at noc repeat BMET in am  --Monitor urine output and chemistries 16: Transaminitis: Monitor chemistries 17: Hypertension: Continue Norvasc and hydralazine             -- Home Zestoretic held  secondary to kidney function. Discussed this with patient.  18: Hyperlipidemia: Continue fenofibrate 19: Bilateral carotid artery stenosis: Asymptomatic             -- Status post bilateral carotid endarterectomies 20: Anemia, chronic disease, hemodilution etiologies likely: Monitor CBC             -- Fecal occult blood negative 2/13             -- Colonoscopy 2016, polyps.  Recall  5 years 21. Prediabetes: check CBGs AC/HS.  22. Hypokalemia: supplement 36meq on 2/17.  23. Suboptimal vitamin D level: continue 2000U daily.  24. Insomnia: start trazodone 50mg  HS. Start sleep chart. 25. Worsening of cognitive status with no new focal weakness, recent labwork and vitals unremarkable , unremarkable CXR, per Dr Erlinda Hong repeat CT head twith small punctate hemorrhage, AMS likely multifactorial , hx mild dementia, on benzo, bilateral infarcts. UA/UC ordered.  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  27. Overweight BMI 29.27: provide dietary education  LOS: 5 days A FACE TO FACE EVALUATION WAS PERFORMED  William [name] 08/03/2021, 5:51 PM

## 2021-08-03 NOTE — Progress Notes (Signed)
Occupational Therapy Session Note  Patient Details  Name: William [name] MRN: [id number] Date of Birth: 01-Apr-1941  Today's Date: 08/03/2021 OT Individual Time: 1120-1203 and 1300-1345 OT Individual Time Calculation (min): 43 min and 45 min   Short Term Goals: Week 1:  OT Short Term Goal 1 (Week 1): Pt will performed 2/3 toileting tasks with contact guard OT Short Term Goal 2 (Week 1): Pt will don shirt with mod A OT Short Term Goal 3 (Week 1): Pt will perform toilet transfer (on/off) with contact guard OT Short Term Goal 4 (Week 1): Pt will use bilateral hands in all functional tasks with mod VC OT Short Term Goal 5 (Week 1): Pt will stand at the sink to perform 2 functional tasks with contact guard for balance  Skilled Therapeutic Interventions/Progress Updates:    Visit 1: no c/o pain Pt received in wc with spouse present. Pt seen this session to focus on LUE active movement/coordination, L side awareness and sit to stand skills.   Initially had pt work on large AROM movement patterns with knee extension, swinging legs out and in and then opening arms out in a large stretch and back in. Pt then stood up from wc with only CGA. So progressed to pt working on standing holding onto a dowel bar.  Once standing pt worked on squatting back towards chair 10 x2.  He was then able to completely lower to sit and rise back to stand 3 x2.  Pt tolerated these exercises well.  In standing had pt hold dowel bar and work on rotating his trunk side to side as I provided min A balance support.  Pt has great difficulty mobilizing his trunk.  In sitting worked on B grasp on dowel bar to simulate rowing or kayaking movements but he was having difficulty moving L arm due to inattention.  I added in a ball toss in which he had to hit the ball back to me using the dowel in B hands and then with forced use of LUE he was able to actively use arm more effectively.  His wife then engage in a ball toss and catch game  with OT facilitating L hand engagement with catching the ball.   Pt participated well. Resting in wc with alarm belt on and all needs met.   Visit 2: no pain Pt agreeable to a shower today. His wife had to go home for awhile.  Pt transferred into BR via wc and then did a stand pivot to Edge of shower seat with only min A.  Helped pt doff his pants.  To transfer into shower, pt had to rotate his legs in and he suddenly had great difficulty motor planning, needing max A and max cues.  He was very concerned about being cold. Got the water adjusted but then he felt water was too warm despite feeling cold.  Pt was very distracted by being uncomfortable and began leaning to the R and having more difficulty following directions.  He may have been getting tired.  To move the bathing along faster, I quickly bathed him so I could get him out of the shower effeciently. Called in his RN to be a +2 for safety with getting out of shower as pt was still distracted and clearly uncomfortable  (It is not the most comfortable shower set up!).  Just kept talking to pt to make sure he was ok and feeling well.  With +2 help, had pt pivot to edge of  bench and then he was able to stand and transfer with min A to w/c.  Once in w/c he was continuing to lean to the R slightly.  RN helped me get him dressed quickly so he was not cold.  (He did have a very small loose bowel in the shower). Pt did well standing up from chair with only min A and held his balance fairly well as we managed his briefs and pants.   Did not apply TED hose as pt was not going to have any more PT/ OT today. Placed his small pillow on R side to help with alignment. Pt was then sitting much more upright.   Pt will probably do better with a shower in the morning hours to conserve his energy.  Pt resting in w/c with all needs met and belt alarm on.  Therapy Documentation Precautions:  Precautions Precautions: Fall Precaution Comments: L 11th and 12th rib  fxs Restrictions Weight Bearing Restrictions: No    Vital Signs: Therapy Vitals Temp: (!) 97.4 F (36.3 C) Temp Source: Oral Pulse Rate: 72 Resp: 17 BP: 137/69 Patient Position (if appropriate): Sitting Oxygen Therapy O2 Device: Room Air Pain: Pain Assessment Pain Score: 0-No pain ADL: ADL Eating: Supervision/safety Grooming: Minimal assistance Upper Body Bathing: Minimal assistance Where Assessed-Upper Body Bathing: Shower Lower Body Bathing: Minimal assistance Where Assessed-Lower Body Bathing: Shower Upper Body Dressing: Maximal assistance Where Assessed-Upper Body Dressing: Wheelchair Lower Body Dressing: Maximal assistance Where Assessed-Lower Body Dressing: Wheelchair Toileting: Maximal assistance Where Assessed-Toileting: Toilet, Recruitment consultant Transfer: Environmental education officer: Moderate assistance Social research officer, government Method: Heritage manager: Radio broadcast assistant   Therapy/Group: Individual Therapy  [place] 08/03/2021, 4:36 PM

## 2021-08-03 NOTE — Consult Note (Signed)
Neurology Consult Note  HPI: William [name] is a 81 y.o. Caucasian male with PMH of right MCA stroke in 2006, carotid stenosis status post bilateral CEA (the first in 2006 and the second one several years later), CKD 3, hypertension and hyperlipidemia admitted on 07/27/2021 for lethargy, nausea, vomiting and acute right-sided weakness. MRI showed left BG and CR infarct. Discharged to CIR 07/29/2021 with DAPT for 3 months.   Neuro was re-consulted due to complaint by patient of new onset tremor and Parkinsonian signs on exam. He states that the tremor was first noticed before the new stroke.   Subjective: Patient was seen and assessed in wheelchair. No family at bedside. Patient reports having BUE action tremors for past month. No inciting factors that he knows of. Patient denies worsening or improving of tremor since it started a month ago.   Exam: Vitals:   08/03/21 0412 08/03/21 1353  BP: (!) 165/74 137/69  Pulse: 83 72  Resp: 18 17  Temp: 98.3 F (36.8 C) (!) 97.4 F (36.3 C)  SpO2: 97%    Gen: In chair, NAD Resp: non-labored breathing, no acute distress Abd: soft, nt  Neuro: Mental Status: Awake, alert, oriented to self, place, time, and context Cranial Nerves: II: Intact temporal visual fields bilaterally with no extinction to DSS. PERRL.   III,IV, VI: No ptosis. No gaze preference. Tracks appropriately. No nystagmus.  V: Symmetric and equal to light touch VII: Mild facial droop in left lower quadrant compared to right lower quadrant. Hypomimia noted. VIII: Hearing intact to voice IX,X: Hypophonic speech XI: Shoulder shrug symmetric in strength XII: Mild left tongue deviation.  Motor: LUE 3+/5, RUE 4/5, BLE 4/5.  Parietal drift of LUE when testing Barre.  Bradykinetic LUE. Left hand with decreased finger dexterity. No rest tremor noted.  Sensory: Intact and symmetric in all extremities DTR: 4+ L brachioradialis. 3+ R brachiordialis. 3+ L and right patellae Coordination:  FTN intact but slower on left. Mild action tremor noted on right.  Gait: Deferred  Pertinent Labs: CBC Latest Ref Rng & Units 07/30/2021 07/29/2021 07/28/2021  WBC 4.0 - 10.5 K/uL 8.8 10.0 10.7(H)  Hemoglobin 13.0 - 17.0 g/dL 9.1(L) 8.9(L) 9.4(L)  Hematocrit 39.0 - 52.0 % 27.7(L) 26.1(L) 28.8(L)  Platelets 150 - 400 K/uL 184 168 187   CMP Latest Ref Rng & Units 08/01/2021 07/30/2021 07/29/2021  Glucose 70 - 99 mg/dL 113(H) 121(H) 106(H)  BUN 8 - 23 mg/dL 26(H) 23 26(H)  Creatinine 0.61 - 1.24 mg/dL 2.07(H) 2.05(H) 2.02(H)  Sodium 135 - 145 mmol/L 135 141 139  Potassium 3.5 - 5.1 mmol/L 4.0 3.8 3.8  Chloride 98 - 111 mmol/L 105 109 112(H)  CO2 22 - 32 mmol/L 20(L) 23 21(L)  Calcium 8.9 - 10.3 mg/dL 8.5(L) 8.9 8.4(L)  Total Protein 6.5 - 8.1 g/dL - 5.9(L) -  Total Bilirubin 0.3 - 1.2 mg/dL - 0.5 -  Alkaline Phos 38 - 126 U/L - 65 -  AST 15 - 41 U/L - 26 -  ALT 0 - 44 U/L - 48(H) -    Assessment: DELEON PASSE is a 81 y.o. Caucasian male with PMH of right MCA stroke in 2006, carotid stenosis status post bilateral CEA (one in 2006 and the second one several years later), CKD 3, hypertension and hyperlipidemia admitted on 07/27/2021 for lethargy, nausea vomiting and acute right-sided weakness. MRI revealed an acute left BG and CR infarct, with large old right MCA infarct also noted. Discharged to CIR on 07/29/2021  with DAPT for 3 months. Patient has been working with PT/OT and doing well. Neuro was re-consulted due to tremor concerning for parkinsonism. 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 Unlikely to be idiopathic Parkinsonism, but this possibility can be further evaluated with outpatient Neurology follow up.  - Also noted on exam is RUE essential tremor.   Recommendations: 1) No need for anti-Parkinson's medication at this time 2) Recommend outpatient Neurology follow up. No further inpatient Neurology recommendations at this time.  France Ravens,  MD PGY1 Resident  I have seen and examined the patient. I have formulated the assessment and plan. My exam findings were observed and documented by the Neurology resident.  Electronically signed: Dr. Kerney Elbe

## 2021-08-03 NOTE — Progress Notes (Signed)
Patient ID: William [name], male   DOB: 09-15-1940, 81 y.o.   MRN: [id number] Follow up with the patient, wife and son regarding rehab process, medicaid application process and list of private duty resources for assistance with patient care after discharge. Continue to follow along to address educational needs related to medications, dietary modification recommendations and secondary risk management and collaborate with the team and family to facilitate preparation for smooth discharge process. Margarito Liner
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
## 2021-08-03 NOTE — Progress Notes (Signed)
Physical Therapy Session Note  Patient Details  Name: William [name] MRN: [id number] Date of Birth: 09-04-1940  Today's Date: 08/03/2021 PT Individual Time: 1021-1059 PT Individual Time Calculation (min): 38 min   Short Term Goals: Week 1:  PT Short Term Goal 1 (Week 1): Patient will complete supine <> sit with MinA PT Short Term Goal 2 (Week 1): Patient will compelte sit <> stand with MinA and LRAD PT Short Term Goal 3 (Week 1): Patient will ambulate >89f with LRAD and MinA PT Short Term Goal 4 (Week 1): Patient will initiate stair training  Skilled Therapeutic Interventions/Progress Updates:    Pt met supine in bed w/ HOB elevated, wife present, reported unranked rib pain, and agreeable to therapy. MaxA w/ heavy VC for supine<>sit EOB transfer. CGA sitting EOB w/ feet supported. Stand pivot transfer from elevated bed to w/c minA for RW management. Pt required heavy VC and demo throughout session to push from chair during sit<>stand. Dependent transfer to 5th floor rehab gym and minA stand pivot transfer w/ uncontrolled decent from w/c to mat. Standing single target steps 2x5 bil to increase step length. ~1074fambulation w/ RW minA for RW management and VC + cadence for "big steps" back to room w/ 2 R turns. Pt demonstrated increased step length on R w/ "big step" cadence. Pt completed turns w/ RW w/ slow short steps. Pt left sitting upright in w/c, seat belt on, wife present, and all needs met. Wife inquired about something pt can fidget w/ during rest breaks inbetween therapy, OT notified and stated she would bring something during her session.  Therapy Documentation Precautions:  Precautions Precautions: Fall Precaution Comments: L 11th and 12th rib fxs Restrictions Weight Bearing Restrictions: No General:   Therapy/Group: Individual Therapy  CoAlvira Philips/20/2023, 11:38 AM

## 2021-08-03 NOTE — Progress Notes (Deleted)
Neurology Progress Note  Brief HPI: William [name] is a 81 y.o. Caucasian male with PMH of right MCA stroke in 2006, carotid stenosis status post bilateral CEA 1 in 2006 and the second one several years later, CKD 3, hypertension and hyperlipidemia admitted on 07/27/2021 for lethargy, nausea vomiting and acute right-sided weakness. MRI showed left BG and CR infarct. Discharged to CIR 07/29/2021 with DAPT for 3 months.   Neuro was re-consulted due to tremor.   Subjective: Patient was seen and assessed in wheelchair. No family at bedside. Patient reports having BUE action tremors for past month. No inciting factors that he knows of. Patient denies worsening or improving of tremor since it started a month ago.   Exam: Vitals:   08/03/21 0412 08/03/21 1353  BP: (!) 165/74 137/69  Pulse: 83 72  Resp: 18 17  Temp: 98.3 F (36.8 C) (!) 97.4 F (36.3 C)  SpO2: 97%    Gen: In chair, NAD Resp: non-labored breathing, no acute distress Abd: soft, nt  Neuro: Mental Status: Awake, alert, oriented to self, place, time, and context Cranial Nerves: II: Intact temporal visual fields bilaterally with no extinction to DSS. PERRL.   III,IV, VI: No ptosis. No gaze preference. Tracks appropriately. No nystagmus.  V: Symmetric and equal to light touch VII: Mild facial droop in left lower quadrant compared to right lower quadrant.  VIII: Hearing intact to voice IX,X: Hypophonic speech XI: Shoulder shrug symmetric in strength XII: Mild left tongue deviation.  Motor: LUE 3+/5, RUE 4/5, BLE 4/5. Parietal drift in left hand.  Sensory: Intact and symmetric in all extremities DTR: 4+ L brachioradialis. 3+ R brachiordialis. 3+ in L and right patella Coordination: FTN intact but slower on left. Mild action tremor noted on right.  Gait: Deferred  Pertinent Labs: CBC Latest Ref Rng & Units 07/30/2021 07/29/2021 07/28/2021  WBC 4.0 - 10.5 K/uL 8.8 10.0 10.7(H)  Hemoglobin 13.0 - 17.0 g/dL 9.1(L) 8.9(L) 9.4(L)   Hematocrit 39.0 - 52.0 % 27.7(L) 26.1(L) 28.8(L)  Platelets 150 - 400 K/uL 184 168 187   CMP Latest Ref Rng & Units 08/01/2021 07/30/2021 07/29/2021  Glucose 70 - 99 mg/dL 113(H) 121(H) 106(H)  BUN 8 - 23 mg/dL 26(H) 23 26(H)  Creatinine 0.61 - 1.24 mg/dL 2.07(H) 2.05(H) 2.02(H)  Sodium 135 - 145 mmol/L 135 141 139  Potassium 3.5 - 5.1 mmol/L 4.0 3.8 3.8  Chloride 98 - 111 mmol/L 105 109 112(H)  CO2 22 - 32 mmol/L 20(L) 23 21(L)  Calcium 8.9 - 10.3 mg/dL 8.5(L) 8.9 8.4(L)  Total Protein 6.5 - 8.1 g/dL - 5.9(L) -  Total Bilirubin 0.3 - 1.2 mg/dL - 0.5 -  Alkaline Phos 38 - 126 U/L - 65 -  AST 15 - 41 U/L - 26 -  ALT 0 - 44 U/L - 48(H) -    Assessment: William [name] is a 81 y.o. Caucasian male with PMH of right MCA stroke in 2006, carotid stenosis status post bilateral CEA 1 in 2006 and the second one several years later, CKD 3, hypertension and hyperlipidemia admitted on 07/27/2021 for lethargy, nausea vomiting and acute right-sided weakness. MRI found to have L BG and CR infarct. Discharged to CIR on 07/29/2021 with DAPT for 3 months. Patient has been working with PT/OT and doing well. Neuro was re-consulted due to tremor concerning for parkinsonism.  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 Patient reports having had tremor for ~1 month. Less likely to be idiopathic parkinsonism given patient does  not have resting tremor. Tremor could be due to vascular parkinsonism given multiple stroke history.  Recommendations: 1) No need for anti-Parkinson medication at this time 2) Recommend outpatient neurology follow up. No further inpatient neurology recommendations at this time.  France Ravens, MD PGY1 Resident

## 2021-08-04 ENCOUNTER — Ambulatory Visit: Payer: PPO | Admitting: Physical Therapy

## 2021-08-04 LAB — GLUCOSE, CAPILLARY
Glucose-Capillary: 104 mg/dL — ABNORMAL HIGH (ref 70–99)
Glucose-Capillary: 115 mg/dL — ABNORMAL HIGH (ref 70–99)
Glucose-Capillary: 120 mg/dL — ABNORMAL HIGH (ref 70–99)
Glucose-Capillary: 110 mg/dL — ABNORMAL HIGH (ref 70–99)

## 2021-08-04 LAB — CARBAMAZEPINE, FREE AND TOTAL
Carbamazepine, Free: 1.6 ug/mL (ref 0.6–4.2)
Carbamazepine, Total: 5.8 ug/mL (ref 4.0–12.0)

## 2021-08-04 MED ORDER — AMANTADINE HCL 100 MG PO CAPS
100.0000 mg | ORAL_CAPSULE | Freq: Every day | ORAL | Status: DC
  Administered 2021-08-05 – 2021-08-18 (×14): 100 mg via ORAL
  Filled 2021-08-04 (×14): qty 1

## 2021-08-04 NOTE — Plan of Care (Signed)
°  Problem: RH BOWEL ELIMINATION Goal: RH STG MANAGE BOWEL WITH ASSISTANCE Description: STG Manage Bowel with mod I Assistance. Outcome: Not Progressing; incontinence   Problem: RH BLADDER ELIMINATION Goal: RH STG MANAGE BLADDER WITH ASSISTANCE Description: STG Manage Bladder With mod I  Assistance Outcome: Not Progressing;incontinence   Problem: RH SAFETY Goal: RH STG ADHERE TO SAFETY PRECAUTIONS W/ASSISTANCE/DEVICE Description: STG Adhere to Safety Precautions With cues Assistance/Device. Outcome: Not Progressing; telesitter

## 2021-08-04 NOTE — Evaluation (Addendum)
Speech Language Pathology Assessment and Plan  Patient Details  Name: William [name] MRN: [id number] Date of Birth: 01/01/1941  SLP Diagnosis: Aphasia;Cognitive Impairments;Dysphagia  Rehab Potential: Good ELOS: 2 weeks    Today's Date: 08/04/2021 SLP Individual Time: 4098-1191 SLP Individual Time Calculation (min): 33 min   Hospital Problem: Principal Problem:   Basal ganglia infarction [facility])  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   Mild, related to stroke history   Nocturia    Renal insufficiency 06/25/2013   S/P carotid endarterectomy    BILATERAL ICA--  PATENT PER DUPLEX  05-19-2012   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   Right calf   Urinary frequency    Vitamin D deficiency    Past Surgical History:  Past Surgical History:  Procedure Laterality Date   APPENDECTOMY  AS CHILD   CARDIOVASCULAR STRESS TEST  03-27-2012  DR CRENSHAW   LOW RISK LEXISCAN STUDY-- PROBABLE NORMAL PERFUSION AND SOFT TISSUE ATTENUATION/  NO ISCHEMIA/ EF 51%   CAROTID ENDARTERECTOMY Bilateral LEFT  11-12-2008  DR GREG HAYES   RIGHT ICA  2006  ([facility])   CYSTOSCOPY W/ RETROGRADES Bilateral 06/22/2021   Procedure: CYSTOSCOPY WITH RETROGRADE PYELOGRAM;  Surgeon: Franchot Gallo, MD;  Location: [facility];  Service: Urology;  Laterality:  Bilateral;   CYSTOSCOPY WITH LITHOLAPAXY N/A 02/26/2013   Procedure: CYSTOSCOPY WITH LITHOLAPAXY;  Surgeon: Franchot Gallo, MD;  Location: [facility];  Service: Urology;  Laterality: N/A;   EYE SURGERY  Jan. 2016   cataract surgery both eyes   INGUINAL HERNIA REPAIR Right 11-08-2006   IR KYPHO EA ADDL LEVEL THORACIC OR LUMBAR  02/12/2021   IR RADIOLOGIST EVAL & MGMT  02/18/2021   MASS EXCISION N/A 03/03/2016   Procedure: EXCISION OF BACK  MASS;  Surgeon: Stark Klein, MD;  Location: [facility];  Service: General;  Laterality: N/A;   MOHS SURGERY Left 1/ 2016   Dr Nevada Crane-- Basal cell   PROSTATE SURGERY     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-C N/A 06/22/2021   Procedure: TRANSURETHRAL RESECTION OF BLADDER TUMOR;  Surgeon: Franchot Gallo, MD;  Location: [facility];  Service: Urology;  Laterality: N/A;   TRANSURETHRAL RESECTION OF PROSTATE N/A 02/26/2013   Procedure: TRANSURETHRAL RESECTION OF THE PROSTATE WITH GYRUS INSTRUMENTS;  Surgeon: Franchot Gallo, MD;  Location: [facility];  Service: Urology;  Laterality: N/A;   TRANSURETHRAL RESECTION OF PROSTATE N/A 06/22/2021   Procedure: TRANSURETHRAL RESECTION OF THE PROSTATE (TURP);  Surgeon: Franchot Gallo, MD;  Location: [facility];  Service: Urology;  Laterality: N/A;    Assessment / Plan / Recommendation Clinical Impression Patient is a 81 year old male with a history of previous right MCA infarction with  mild neurocognitive disorder who initially presented to [facility] emergency department on 2/8 for altered mental status.  MRI and CT were negative.  Since was concern for UTI and he was started on cefdinir and discharged home.  The following day on 2/9, he presented to [facility] with worsening confusion and slurred speech, as well as vomiting of coffee-ground type emesis.  His wife found him on the bathroom floor and he was  lethargic and not responding as usual.  CT of the head and cervical spine was significant for an acute perforator infarct at the left basal ganglia and corona radiata.   The scan of the abdomen and pelvis performed secondary to upper abdominal pain and tenderness.  Findings included thickened gallbladder wall, 11th and 12th rib fractures and T12-L3 lateral transverse processes fractures.  Ultrasound of the right upper quadrant revealed gallstones.  Tolerating heart healthy diet with thin liquids.  CIR recommended and patient admitted 07/29/21. Staff and pt's wife reported increase coughing during PO consumption.  BSE Pt demonstrated mild deficits in oropharyngeal dysphagia secondary to likely motor planning issues from vascular parkinson and acute left CVA. Pt demonstrated reduced ROM in facial and lingual movements, as well as reduced lingual strength. SLP was unable to elicit dry swallow due to linguistic cognitive deficits and cough was weak. Pt demonstrated x2 coughs without PO intake during the session. Pt consumed thin liquids without any overt s/s aspiration (TSP, cup and straw), except for initial ice chip pt demonstrated immediate cough likely due to swallow delay. Pt demonstrated ability to effective masticate, clear oral cavity and AP appeared timely with dys 1 and dys 2 textures. Pt demonstrated mild increase mastication/bolus prep time and AP transport time with dys 3 and regular textures. SLP recommends continuing diet of regular textures and thin liquids, medication whole with thin liquids, with now full supervision A for small bites/sips, clear oral cavity between bites and assistance as needed self-feeding. SLP provided education to pt, pt's wife and NT. Recommend to continue ST services focused on cognitive linguistic, speech and swallow skills.   Skilled Therapeutic Interventions          Skilled ST services focused on swallow and language skills. SLP gathered baseline information from pt's  wife, noting need for assistance in IADLs and occasional assistance with ADLs. Acute deficits in swallowing, speech, language and cognition skills. SLP conducted BSE and provided treatment plan for swallowing as well as educated NT. SLP facilitated word finding strategies in convergent naming task, pt demonstrated accuracy in 5/6 opportunities with max A semantic and sentence completion cues. SLP and nursing staff assisted in changing of sheets. Pt was left in room with wife.    SLP Assessment  Patient will need skilled Speech Lanaguage Pathology Services during CIR admission    Recommendations  SLP Diet Recommendations: Age appropriate regular solids;Thin Liquid Administration via: Cup;Straw Medication Administration: Whole meds with liquid Supervision: Patient able to self feed;Staff to assist with self feeding Compensations: Minimize environmental distractions;Slow rate;Small sips/bites;Lingual sweep for clearance of pocketing Postural Changes and/or Swallow Maneuvers: Seated upright 90 degrees Oral Care Recommendations: Oral care BID Patient destination: Home Follow up Recommendations: 24 hour supervision/assistance;Home Health SLP Equipment Recommended: None recommended by SLP    SLP Frequency 3 to 5 out of 7 days   SLP Duration  SLP Intensity  SLP Treatment/Interventions 2 weeks  Minumum of 1-2 x/day, 30 to 90 minutes  Cognitive remediation/compensation;Internal/external aids;Speech/Language facilitation;Therapeutic Activities;Environmental controls;Cueing hierarchy;Functional tasks;Patient/family education;Dysphagia/aspiration precaution training  Pain Pain Assessment Pain Score: 0-No pain  Prior Functioning Cognitive/Linguistic Baseline: Baseline deficits Baseline deficit details: mild memory loss and assistance with IADLs per wife Type of Home: House  Lives With: Spouse Available Help at Discharge: Family Vocation: Retired  SLP Evaluation   Oral Motor Oral  Motor/Sensory Function Overall Oral Motor/Sensory Function: Mild impairment Facial Symmetry: Abnormal symmetry left;Suspected CN VII (facial) dysfunction Facial Strength: Within Functional Limits Facial Sensation: Within Functional Limits Lingual ROM: Reduced right;Reduced left Lingual Symmetry: Within Functional Limits Lingual Strength: Reduced Lingual Sensation: Within Functional Limits Velum: Within Functional Limits Mandible: Within Functional Limits Motor Speech Overall Motor Speech: Impaired Respiration: Within functional limits Phonation: Low vocal intensity Resonance: Within functional limits Articulation: Within functional limitis Intelligibility: Intelligibility reduced Motor Planning: Witnin functional limits   Bedside Swallowing Assessment General Previous Swallow Assessment: none per chart Diet Prior to this Study: Regular;Thin liquids History of Recent Intubation: No Behavior/Cognition: Alert;Cooperative Oral Cavity - Dentition: Adequate natural dentition;Missing dentition Self-Feeding Abilities: Needs assist;Able to feed self Vision: Functional for self-feeding Patient Positioning: Upright in bed Baseline Vocal Quality: Low vocal intensity Volitional Cough: Weak Volitional Swallow: Unable to elicit  Oral Care Assessment Does patient have any of the following "high(er) risk" factors?: None of the above Does patient have any of the following "at risk" factors?: None of the above Patient is LOW RISK: Follow universal precautions (see row information) Ice Chips Ice chips: Impaired Presentation: Spoon Oral Phase Functional Implications: Other (comment) (premature spillage) Pharyngeal Phase Impairments: Suspected delayed Swallow;Cough - Immediate Thin Liquid Thin Liquid: Within functional limits Presentation: Cup;Straw;Self Fed Nectar Thick Nectar Thick Liquid: Not tested Honey Thick Honey Thick Liquid: Not tested Puree Puree: Within functional  limits Solid Solid: Impaired Presentation: Self Fed Oral Phase Functional Implications: Prolonged oral transit;Impaired mastication;Oral holding Pharyngeal Phase Impairments: Suspected delayed Swallow BSE Assessment Suspected Esophageal Findings Suspected Esophageal Findings: Belching Risk for Aspiration Impact on safety and function: Mild aspiration risk Other Related Risk Factors: Previous CVA;Cognitive impairment  Short Term Goals: Week 1: SLP Short Term Goal 1 (Week 1): Patient will demonstrate sustained attention to functional tasks for 20 mintues with Mod verbal cues for redirection. SLP Short Term Goal 2 (Week 1): Patient will utilize external aids for orientation to place, time and situation with Mod verbal cues. SLP Short Term Goal 3 (Week 1): Patient will utilize an increaesd vocal intensity at the word level to achieve ~75% intelligibility with Max A multimodal cues. SLP Short Term Goal 4 (Week 1): Patient will demonstrate improved word-finding at the phrase level with Mod A multitmodal cues. SLP Short Term Goal 5 (Week 1): Patient will self-monitor and correct errors during verbal expression with Mod A multimodal cues. SLP Short Term Goal 6 (Week 1): Pt will consume regular textures and thin liquids with minimal overt s/s aspiration given supervision A verbal cues to use small bites and clear oral cavity in between bites.  Refer to Care Plan for Long Term Goals  Recommendations for other services: None   Discharge Criteria: Patient will be discharged from SLP if patient refuses treatment 3 consecutive times without medical reason, if treatment goals not met, if there is a change in medical status, if patient makes no progress towards goals or if patient is discharged from hospital.  The above assessment, treatment plan, treatment alternatives and goals were discussed and mutually agreed upon: by patient and by family  Zamirah Denny  [facility] 08/04/2021, 4:33 PM

## 2021-08-04 NOTE — Progress Notes (Signed)
Occupational Therapy Session Note  Patient Details  Name: William [name] MRN: [id number] Date of Birth: January 28, 1941  Today's Date: 08/04/2021 OT Individual Time: 6314-9702 OT Individual Time Calculation (min): 59 min    Short Term Goals: Week 1:  OT Short Term Goal 1 (Week 1): Pt will performed 2/3 toileting tasks with contact guard OT Short Term Goal 2 (Week 1): Pt will don shirt with mod A OT Short Term Goal 3 (Week 1): Pt will perform toilet transfer (on/off) with contact guard OT Short Term Goal 4 (Week 1): Pt will use bilateral hands in all functional tasks with mod VC OT Short Term Goal 5 (Week 1): Pt will stand at the sink to perform 2 functional tasks with contact guard for balance  Skilled Therapeutic Interventions/Progress Updates:    Pt semi reclined in bed, c/o 7/10 pain in left ribcage area.  Nurse present and reports she will return with pain medication.  Pt agreeable to washing up and changing clothes at sink.  Left roll with min assist, and sidelying to sit with mod assist due to left rib pain.  Sit to stand with min assist and repetitive cues for safe hand placement and body mechanics.  Pt ambulated to sink using RW with CGA.  Pt stood at sink and perseverated on cleaning sink using washcloth.   Ultimately required max assist to doff brief which was soiled of urine and then stand to sit with CGA and doffed shirt with min assist and step by step cues due to distracted and perseverating returned to cleaning sink. Pt needing repetitive multimodal cues to doff brief and shirt. Also required step by step cues to bathe UB and LB at sit<>stand level with overall min assist.  Pt donned clean brief and pants with total assist due to pt beginning to lean forward significantly and rigid with inability to reposition.  Also noted RLE tremoring intermittently.  Wife arrived and reporting concern about pts posturing.  Educated wife that this has been occurring in previous sessions per chart review  and appears to be related to fatigue nearing end of sessions.  Pt setup with lunch and noted pt coughing after taking a bite of food.  Pts wife reports he has been coughing after eating recently.  SPT made aware.  Call bell in reach, seat alarm on at end of session.  Therapy Documentation Precautions:  Precautions Precautions: Fall Precaution Comments: L 11th and 12th rib fxs Restrictions Weight Bearing Restrictions: No    Therapy/Group: Individual Therapy  Ezekiel Slocumb 08/04/2021, 10:23 AM

## 2021-08-04 NOTE — Plan of Care (Signed)
°  Problem: RH Swallowing Goal: LTG Patient will consume least restrictive diet using compensatory strategies with assistance (SLP) Description: LTG:  Patient will consume least restrictive diet using compensatory strategies with assistance (SLP) Flowsheets (Taken 08/04/2021 1651) LTG: Pt Patient will consume least restrictive diet using compensatory strategies with assistance of (SLP): Supervision Goal: LTG Patient will participate in dysphagia therapy to increase swallow function with assistance (SLP) Description: LTG:  Patient will participate in dysphagia therapy to increase swallow function with assistance (SLP) Flowsheets (Taken 08/04/2021 1651) LTG: Pt will participate in dysphagia therapy to increase swallow function with assistance of (SLP): Supervision Goal: LTG Pt will demonstrate functional change in swallow as evidenced by bedside/clinical objective assessment (SLP) Description: LTG: Patient will demonstrate functional change in swallow as evidenced by bedside/clinical objective assessment (SLP) Flowsheets (Taken 08/04/2021 1651) LTG: Patient will demonstrate functional change in swallow as evidenced by bedside/clinical objective assessment: Oropharyngeal swallow

## 2021-08-04 NOTE — Progress Notes (Signed)
Physical Therapy Session Note  Patient Details  Name: William [name] MRN: [id number] Date of Birth: 03/13/1941  Today's Date: 08/04/2021 PT Individual Time: 8502-7741 PT Individual Time Calculation (min): 59 min   Short Term Goals: Week 1:  PT Short Term Goal 1 (Week 1): Patient will complete supine <> sit with MinA PT Short Term Goal 2 (Week 1): Patient will compelte sit <> stand with MinA and LRAD PT Short Term Goal 3 (Week 1): Patient will ambulate >41f with LRAD and MinA PT Short Term Goal 4 (Week 1): Patient will initiate stair training  Skilled Therapeutic Interventions/Progress Updates:    Pt met sitting in w/c eating breakfast, wife present, reported no pain, and agreeable to PT. Pt finished breakfast independently. Pt was not agitated during session but did show multiple signs of aggression throughout treatment in a soft spoken manning, including flipping middle finger twice to wife after she repeated VC to help don pt's mask. Pt demonstrated rigid flexed trunk in sitting and unable to correct w/ VC prior to transport to day room. Dependent transfer to day room w/ CGA on patient during transfer due to anterior lean. In day room, Pt ambulated ~663fw/ RW minA for balance and RW management due to heavy L drift during ambulation. Pt required heavy VC to increase step length and cadence. Agility ladder completed x1 but pt had difficulty managing RW and steps simultaneously. ~17055fait trials w/ bil HA assist x3 completed w/ seated rest breaks inbetween. Pt demonstrated improved upright posture, improved step length on the R, flexed knee on the L due to hamstring tightness, and increased L lateral push as pt fatigued during gait. Metranome 55 bpm trialed and pt able to keep pace on R but unable to keep pace on L due to short step length and flexed knee during stance. During seated rest breaks in w/c pt demonstrated flexed and R lateral lean posture that he was able to correct w/ heavy cueing and  mirror feedback. As pt fatigued, he was unable to correct posture independently, requiring maxA to correct. Pt consistently talking softly to himself throughout session and had 2 instances where someone waved hello in hallway and pt stated "I feel like punching them in the face" in a soft spoken manner but not agitated. Pt included gentle continuous fist bump to PT knee in sitting after second person waved hello to simulate intended actions. Pt unable to explain why he had this feeling and may be due to confusion as he was not agitated nor seemed aggressive at any point during the session . Finished session w/ seated hamstring stretching on L w/ pt straightening leg and reaching towards toes. Pt unable to correct forward lean posture independently despite heavy VC cues and mirror feedback. Pt required maxA to reposition to upright sitting in w/c after stretching. Dependent transfer back to room, pt wife and MD present, informed wife and MD of hamstring tightness during gait, seat belt alarm on sitting in w/c, and all needs met.  Therapy Documentation Precautions:  Precautions Precautions: Fall Precaution Comments: L 11th and 12th rib fxs Restrictions Weight Bearing Restrictions: No General:    Therapy/Group: Individual Therapy  ConLucile ShuttersPT  08/04/2021, 7:53 AM

## 2021-08-04 NOTE — Progress Notes (Signed)
PROGRESS NOTE   Subjective/Complaints: He expresses no new complaints this morning Discussed with wife neurology diagnosis/recommendations. She notes tremor interferes with his ability to eat. Messaged Horris Latino about trying weighted utensils  ROS: Occ cough , no CP or SOB, +tremor, pill rolling and intentional  Objective:   No results found. No results for input(s): WBC, HGB, HCT, PLT in the last 72 hours.  No results for input(s): NA, K, CL, CO2, GLUCOSE, BUN, CREATININE, CALCIUM in the last 72 hours.   Intake/Output Summary (Last 24 hours) at 08/04/2021 1325 Last data filed at 08/03/2021 1810 Gross per 24 hour  Intake --  Output 100 ml  Net -100 ml        Physical Exam: Vital Signs Blood pressure 136/60, pulse 72, temperature 98.5 F (36.9 C), temperature source Oral, resp. rate 14, height 5\' 11"  (1.803 m), weight 95.2 kg, SpO2 93 %.  General: No acute distress, BMI 29.27 Mood and affect are appropriate Heart: Regular rate and rhythm no rubs murmurs or extra sounds Lungs: diminished BS at bases, breathing unlabored, no rales or wheezes Abdomen: Positive bowel sounds, soft nontender to palpation, nondistended Extremities: No clubbing, cyanosis, or edema Skin: No evidence of breakdown, no evidence of rash Neuro:  Eyes without evidence of nystagmus  Tone is normal without evidence of spasticity Left neglect noted (old R MCA infarct)  Alert and oriented to person place , not time. RUE 4/5, LUE 4/5 proximally and 4/5 distally, bilateral lower extremities 3/5.  Able to tolerate standing.  Pill rolling and intention tremor     Assessment/Plan: 1. Functional deficits which require 3+ hours per day of interdisciplinary therapy in a comprehensive inpatient rehab setting. Physiatrist is providing close team supervision and 24 hour management of active medical problems listed below. Physiatrist and rehab team continue to  assess barriers to discharge/monitor patient progress toward functional and medical goals  Care Tool:  Bathing    Body parts bathed by patient: Right arm, Left arm, Chest, Abdomen, Front perineal area, Buttocks, Right upper leg, Left upper leg, Face   Body parts bathed by helper: Right lower leg, Left lower leg     Bathing assist Assist Level: Minimal Assistance - Patient > 75%     Upper Body Dressing/Undressing Upper body dressing   What is the patient wearing?: Pull over shirt    Upper body assist Assist Level: Maximal Assistance - Patient 25 - 49%    Lower Body Dressing/Undressing Lower body dressing      What is the patient wearing?: Pants, Underwear/pull up     Lower body assist Assist for lower body dressing: Maximal Assistance - Patient 25 - 49%     Toileting Toileting    Toileting assist Assist for toileting: Total Assistance - Patient < 25%     Transfers Chair/bed transfer  Transfers assist     Chair/bed transfer assist level: 2 Helpers     Locomotion Ambulation   Ambulation assist      Assist level: Moderate Assistance - Patient 50 - 74% Assistive device: Walker-rolling Max distance: 25   Walk 10 feet activity   Assist     Assist level: Moderate Assistance - Patient -  50 - 74% Assistive device: Walker-rolling   Walk 50 feet activity   Assist Walk 50 feet with 2 turns activity did not occur: Safety/medical concerns         Walk 150 feet activity   Assist Walk 150 feet activity did not occur: Safety/medical concerns         Walk 10 feet on uneven surface  activity   Assist Walk 10 feet on uneven surfaces activity did not occur: Safety/medical concerns         Wheelchair     Assist Is the patient using a wheelchair?: Yes Type of Wheelchair: Manual    Wheelchair assist level: Total Assistance - Patient < 25% Max wheelchair distance: 150    Wheelchair 50 feet with 2 turns activity    Assist         Assist Level: Total Assistance - Patient < 25%   Wheelchair 150 feet activity     Assist      Assist Level: Total Assistance - Patient < 25%   Blood pressure 136/60, pulse 72, temperature 98.5 F (36.9 C), temperature source Oral, resp. rate 14, height 5\' 11"  (1.803 m), weight 95.2 kg, SpO2 93 %.  Medical Problem List and Plan: 1. 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  Continue CIR 2.  Impaired mobility:  -DVT/anticoagulation:  Mechanical: Sequential compression devices, below knee Bilateral lower extremities; start Lovenox.              -antiplatelet therapy: aspirin 3. Rib fracture pain: Tylenol, tramadol as needed. Start lidocaine patch. Discussed that this should be applied directly over area of pain 4. Depression: Start trazodone 50mg  HS LCSW asked to evaluate and provide emotional support             --continue carbamazepine              -antipsychotic agents: Not applicable 5. Neuropsych: This patient is not capable of making decisions on his own behalf. 6. Skin/Wound Care: Routine skin care checks 7. 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 cont IVF at noc  repeat BMET in am  --Monitor urine output and chemistries 16: Transaminitis: Monitor chemistries 17: Hypertension: Continue Norvasc and hydralazine             -- Home Zestoretic held secondary to kidney function. Discussed this with patient.  18: Hyperlipidemia: Continue fenofibrate 19: Bilateral carotid artery stenosis: Asymptomatic             -- Status post bilateral carotid endarterectomies 20: Anemia, chronic disease, hemodilution etiologies likely: Monitor CBC             -- Fecal occult blood negative 2/13             --  Colonoscopy 2016, polyps.  Recall 5 years 21. Prediabetes: check CBGs AC/HS.  22. Hypokalemia: supplement 23meq on 2/17.  23. Suboptimal vitamin D level: continue 2000U daily.  24. Insomnia: start trazodone 50mg  HS. Start sleep chart. 25. Worsening of cognitive status with no new focal weakness, recent labwork and vitals unremarkable , unremarkable CXR, per Dr Erlinda Hong repeat CT head twith small punctate hemorrhage, AMS likely multifactorial , hx mild dementia, on benzo, bilateral infarcts. 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 Discussed diagnoses and treatment options with wife and therapy 27. Overweight BMI 29.27: provide dietary education 28. Decreased initiation: start amantadine tomorrow morning.   LOS: 6 days A FACE TO FACE EVALUATION WAS PERFORMED  William [name] 08/04/2021, 1:25 PM

## 2021-08-05 ENCOUNTER — Ambulatory Visit: Payer: PPO | Admitting: Physical Therapy

## 2021-08-05 ENCOUNTER — Ambulatory Visit (HOSPITAL_COMMUNITY): Payer: Self-pay | Admitting: Psychiatry

## 2021-08-05 LAB — CBC
HCT: 26.7 % — ABNORMAL LOW (ref 39.0–52.0)
Hemoglobin: 9.1 g/dL — ABNORMAL LOW (ref 13.0–17.0)
MCH: 32.5 pg (ref 26.0–34.0)
MCHC: 34.1 g/dL (ref 30.0–36.0)
MCV: 95.4 fL (ref 80.0–100.0)
Platelets: 261 10*3/uL (ref 150–400)
RBC: 2.8 MIL/uL — ABNORMAL LOW (ref 4.22–5.81)
RDW: 13.2 % (ref 11.5–15.5)
WBC: 6.4 10*3/uL (ref 4.0–10.5)
nRBC: 0 % (ref 0.0–0.2)

## 2021-08-05 LAB — GLUCOSE, CAPILLARY
Glucose-Capillary: 106 mg/dL — ABNORMAL HIGH (ref 70–99)
Glucose-Capillary: 100 mg/dL — ABNORMAL HIGH (ref 70–99)
Glucose-Capillary: 128 mg/dL — ABNORMAL HIGH (ref 70–99)
Glucose-Capillary: 119 mg/dL — ABNORMAL HIGH (ref 70–99)

## 2021-08-05 LAB — BASIC METABOLIC PANEL
Anion gap: 7 (ref 5–15)
BUN: 21 mg/dL (ref 8–23)
CO2: 21 mmol/L — ABNORMAL LOW (ref 22–32)
Calcium: 8.8 mg/dL — ABNORMAL LOW (ref 8.9–10.3)
Chloride: 109 mmol/L (ref 98–111)
Creatinine, Ser: 1.97 mg/dL — ABNORMAL HIGH (ref 0.61–1.24)
GFR, Estimated: 34 mL/min — ABNORMAL LOW (ref >60)
Glucose, Bld: 112 mg/dL — ABNORMAL HIGH (ref 70–99)
Potassium: 3.9 mmol/L (ref 3.5–5.1)
Sodium: 137 mmol/L (ref 135–145)

## 2021-08-05 MED ORDER — TRAZODONE HCL 50 MG PO TABS
75.0000 mg | ORAL_TABLET | Freq: Every day | ORAL | Status: DC
Start: 2021-08-05 — End: 2021-08-18
  Administered 2021-08-05 – 2021-08-17 (×13): 75 mg via ORAL
  Filled 2021-08-05 (×10): qty 2
  Filled 2021-08-05: qty 1.5
  Filled 2021-08-05 (×2): qty 2

## 2021-08-05 NOTE — Patient Care Conference (Signed)
Inpatient RehabilitationTeam Conference and Plan of Care Update Date: 08/05/2021   Time: 11:16 AM    Patient Name: William [name]      Medical Record Number: [id number]  Date of Birth: [birth year] Sex: Male         Room/Bed: 5C10C/5C10C-01 Payor Info: Payor: [employer] / Plan: [employer] Must PPO / Product Type: *No Product type* /    Admit Date/Time:  07/29/2021  3:34 PM  Primary Diagnosis:  Basal ganglia infarction [facility])  Hospital Problems: Principal Problem:   Basal ganglia infarction [facility])    Expected Discharge Date: Expected Discharge Date: 08/18/21  Team Members Present: Physician leading conference: Dr. Leeroy Cha Social Worker Present: Ovidio Kin, LCSW Nurse Present: Dorien Chihuahua, RN PT Present: Ginnie Smart, PT OT Present: Leretha Pol, OT SLP Present: Charolett Bumpers, SLP PPS Coordinator present : Gunnar Fusi, SLP     Current Status/Progress Goal Weekly Team Focus  Bowel/Bladder   continent vs incont. last BM-2/21  regain cont.  assess qshift and PRN   Swallow/Nutrition/ Hydration   regular textures and thin liquids, full supervision A  Supervision A  tolerance of diet and use of swallow strategies   ADL's   performance fluctuates signiciantly; min assist to max assist UB self care; mod-max assist LB self care; min assist functional transfers; anterior/left lean with fatigue  min-supervision  self care training, sequencing and initiation, functional transfer training, RW management, pt and caregiver education   Mobility   mod-maxA bed mobility due to rigidity, minA sit<>stand, minA ambulation 183f, becomes totalA when fatigued  CGA to S overall, anticpate need to downgrade  increaseing step length, endurance, sitting balance, d/c planning, bed mobility, family education   Communication   Max A incraese vocal intensity, Mod A word finding  Min A  increase speech intelligibility, higher level word finding and clarity of thought    Safety/Cognition/ Behavioral Observations  orientation Supervision A, Mod A cognition  MIn A  sustained attention, orientation, recall strategies and basic problem solving   Pain   denies pain  pain<3  assess pain q shift and PRN   Skin   skin intact; eccymotic areas to BUE  no skin breakdowns  assess skin q shift and PRN     Discharge Planning:  HOme with wife who plans on taking a leave from work or can retire. Wife here daily and observes in therapies   Team Discussion: Patient with parkinsonian symptoms. Patient not sleeping well, MD added sleep aid. Not affected by hemorrhagic transformation of stroke other than continued confusion. Spouse noted needed assist with IADLs PTA; but could be left solo for a short  period of time. Now with sequencing issues; perseverates on tasks and has poor postural control and motor planning deficits.  Patient on target to meet rehab goals: Currently needs mod - max assist for bathing and dressing and depending on fatigue; function fluctuates, patient may need total assist. Was able to ambulate up to 170' using bilateral hand support and min assist.   *See Care Plan and progress notes for long and short-term goals.   Revisions to Treatment Plan:  Neuro consult for tremors; essential tremors, MD added medications Bedside swallow eval due to coughing when eating Downgraded goals to min assist  Teaching Needs: Safety, transfers, toileting, medications, secondary risk management, etc  Current Barriers to Discharge: Decreased caregiver support  Possible Resolutions to Barriers: Family education HH follow up services Wife given list of private duty resources to help her with patient  care [place] program recommended     Medical Summary Current Status: overweight, insomnia, decreased initiation, parkinsonian symptoms, left sided abdominal pain, confused, basal ganglia infarction, pill rolling tremor  Barriers to Discharge: Medical  stability;Weight;Behavior  Barriers to Discharge Comments: overweight, insomnia, decreased initiation, parkinsonian symptoms, left sided abdominal pain, confused, basal ganglia infarction, pill rolling tremor Possible Resolutions to [employer]: provide dietary education, increase trazodone to 50m, start mantadine 1020mdaily, trial weighted utensils, continue lidocaine patch, continue IV fluids   Continued Need for Acute Rehabilitation Level of Care: The patient requires daily medical management by a physician with specialized training in physical medicine and rehabilitation for the following reasons: Direction of a multidisciplinary physical rehabilitation program to maximize functional independence : Yes Medical management of patient stability for increased activity during participation in an intensive rehabilitation regime.: Yes Analysis of laboratory values and/or radiology reports with any subsequent need for medication adjustment and/or medical intervention. : Yes   I attest that I was present, lead the team conference, and concur with the assessment and plan of the team.   ShDorien Chihuahua 08/05/2021, 5:14 PM

## 2021-08-05 NOTE — Plan of Care (Signed)
°  Problem: RH BLADDER ELIMINATION Goal: RH STG MANAGE BLADDER WITH ASSISTANCE Description: STG Manage Bladder With mod I  Assistance Outcome: Not Progressing; incontinence   Problem: RH SAFETY Goal: RH STG ADHERE TO SAFETY PRECAUTIONS W/ASSISTANCE/DEVICE Description: STG Adhere to Safety Precautions With cues Assistance/Device. Outcome: Not Progressing; telesitter

## 2021-08-05 NOTE — Progress Notes (Signed)
Pt had good night, slept well from 23:00-5am.

## 2021-08-05 NOTE — Progress Notes (Signed)
Physical Therapy Session Note  Patient Details  Name: William [name] MRN: [id number] Date of Birth: 1941/01/19  Today's Date: 08/05/2021 PT Individual Time: 8250-0370 PT Individual Time Calculation (min): 41 min   and  Today's Date: 08/05/2021 PT Missed Time: 19 Minutes Missed Time Reason: Patient fatigue;Nursing care  Short Term Goals: Week 1:  PT Short Term Goal 1 (Week 1): Patient will complete supine <> sit with MinA PT Short Term Goal 2 (Week 1): Patient will compelte sit <> stand with MinA and LRAD PT Short Term Goal 3 (Week 1): Patient will ambulate >46ft with LRAD and MinA PT Short Term Goal 4 (Week 1): Patient will initiate stair training  Skilled Therapeutic Interventions/Progress Updates:    Pt received supine in bed resting with lights off and nurse arriving for medication administration. Pt reports "feeling doped up" - upon further clarification, pt states this means he feels "tired." Pt states he is "happy" to see this therapist but apologetically declines participation in therapy at this time due to fatigue despite attempts at encouragement. Therapist will attempt to return at another time as scheduling allows.    Therapist returned to pt room ~ 1 hour later with pt supine in bed with NT present assisting with peri-care and LB clothing management and pt's wife, [name], present. With min encouragement, pt agreeable to therapy session - pt's wife provided encouragement and education to pt on importance of participating in all therapy sessions. Rolling R/L in bed with heavy min assist using bedrails during total assist LB clothing management and peri-care for time management and cleanliness - cuing for sequencing and use of bedrails to increase independence with rolling.  Supine>sitting R EOB, HOB flat but using bedrail, with mod assist for bringing trunk upright - cuing for increased pt independence as he wants to pull up on therapist's hand. Sitting EOB with supervision  maintaining good midline, upright posture - donned shoes total assist for time.  Sit>stand EOB>RW requiring 2nd attempt to come to stand due to delayed powering up, but then able to achieve upright with min assist. Upon initiating gait training pt suddenly reports need to use bathroom. Gait training ~56ft into bathroom using RW with CGA - requires mod cuing for turning fully and managing AD to line up with BSC over toilet. Standing with CGA/close supervision during total assist LB clothing management due to urgency. Pt attempted to void BM but unable and pt reports feeling constipated - nurse notified and present to assess and treat as needed.  Therapist provided CGA while pt stood with B UE support on RW during nursing care - therapist and pt's wife providing calming, breathing, relaxation techniques for pain management. Pt requesting to return to bed due to rectal pain. Gait ~47ft back to EOB using RW as described above - pt continues to require cuing for stepping back towards EOB while keeping AD with him (pt distracted by looking out the window) . Sit>supine with mod assist for trunk descent and B LE management onto bed - cuing for reverse logroll technique to increase pt independence. Pt left supine in bed with needs in reach and in the care of the nurse and pt's wife. Missed 19 minutes of skilled physical therapy.  Therapy Documentation Precautions:  Precautions Precautions: Fall Precaution Comments: L 11th and 12th rib fxs Restrictions Weight Bearing Restrictions: No   Pain:  Rectal pain with nursing care to address pt's constipation and inability to void.    Therapy/Group: Individual Therapy  [place] ,  PT, DPT, NCS, CSRS 08/05/2021, 1:04 PM

## 2021-08-05 NOTE — Progress Notes (Signed)
Occupational Therapy Session Note  Patient Details  Name: William [name] MRN: [id number] Date of Birth: 1940-09-15  Today's Date: 08/05/2021 OT Individual Time: 805-900    Short Term Goals: Week 1:  OT Short Term Goal 1 (Week 1): Pt will performed 2/3 toileting tasks with contact guard OT Short Term Goal 2 (Week 1): Pt will don shirt with mod A OT Short Term Goal 3 (Week 1): Pt will perform toilet transfer (on/off) with contact guard OT Short Term Goal 4 (Week 1): Pt will use bilateral hands in all functional tasks with mod VC OT Short Term Goal 5 (Week 1): Pt will stand at the sink to perform 2 functional tasks with contact guard for balance   Skilled Therapeutic Interventions/Progress Updates:    Pt semi reclined in bed, No c/o pain. Requesting to shower and change clothes.  Pts wife arrived mid session for support and asked various questions from therapist.  Supine to sit towards right using bed features with mod assist.  Sit to stand and ambulated to bathroom due to requesting to urinate mid ambulation with min assist using RW.  Pt had incontinent episode of urine in standing prior to completing toilet transfer, requiring max assist for LB bathing.  Pt completed toilet transfer with min assist and max multimodal cueing.  Pt completed small continent episode of bowel and needing total assist for thorough pericare.  Pt ambulated to sink and stand to sit with min assist.  All UB bathing and dressing completed with max multimodal cues to prevent perseveration and encourage sequencing of tasks and min assist overall.  Pt required max assist for LB dressing including brief and pants.  Pt needing total assist to doff/donn grip socks.  Pt requiring max assist to insert dentures.  Call bell in reach, seat alarm on.  Therapy Documentation Precautions:  Precautions Precautions: Fall Precaution Comments: L 11th and 12th rib fxs Restrictions Weight Bearing Restrictions: No    Therapy/Group:  Individual Therapy  Ezekiel Slocumb 08/05/2021, 8:08 AM

## 2021-08-05 NOTE — Progress Notes (Signed)
Physical Therapy Session Note  Patient Details  Name: William [name] MRN: [id number] Date of Birth: 16-Nov-1940  Today's Date: 08/05/2021 PT Individual Time: 1133-1200 PT Individual Time Calculation (min): 27 min   Short Term Goals: Week 1:  PT Short Term Goal 1 (Week 1): Patient will complete supine <> sit with MinA PT Short Term Goal 2 (Week 1): Patient will compelte sit <> stand with MinA and LRAD PT Short Term Goal 3 (Week 1): Patient will ambulate >58ft with LRAD and MinA PT Short Term Goal 4 (Week 1): Patient will initiate stair training  Skilled Therapeutic Interventions/Progress Updates:    Pt met supine in bed w/ HOB slightly elevated, wife present, reported no pain, and agreeable to PT. Pt and wife informed of anticipated d/c date of 3/7 which both pt and wife agreed was appropriate. Bed mobility rolling completed heavy VC and maxA due to rigidity and poor motor planning. Pt completed supine bridges in bed w/ total A to shift hips laterally to scoot toward EOB. Supine<>sit EOB maxA due to inability to maintain sidelying posture due to rigidity and motor plan w/ UE. Sit<>stand minA after multiple tries and heavy VC to push up from bed w/ UE. Pt ambulated w/ RW +2 minA for balance and heavy L side RW management ~144ft. Pt demonstrated decreased initiation, decreased step length, poor safety awareness, and midline disorientation during gait. Pt transferred stand<>sit in w/c minA, left sitting upright in w/c, seat belt alarm on, call bell in reach, NT present for blood sugar test, and all needs met.  Therapy Documentation Precautions:  Precautions Precautions: Fall Precaution Comments: L 11th and 12th rib fxs Restrictions Weight Bearing Restrictions: No General:    Therapy/Group: Individual Therapy  Lucile Shutters, SPT  08/05/2021, 12:06 PM

## 2021-08-05 NOTE — Progress Notes (Signed)
PROGRESS NOTE   Subjective/Complaints: Walked 170 feet yesterday! Tight hamstrings Total A when tired.  Shaving with Horris Latino  ROS: Cough worse with eating , no CP or SOB, +tremor, pill rolling and intentional  Objective:   No results found. Recent Labs    08/05/21 0615  WBC 6.4  HGB 9.1*  HCT 26.7*  PLT 261    Recent Labs    08/05/21 0615  NA 137  K 3.9  CL 109  CO2 21*  GLUCOSE 112*  BUN 21  CREATININE 1.97*  CALCIUM 8.8*     Intake/Output Summary (Last 24 hours) at 08/05/2021 1124 Last data filed at 08/05/2021 0900 Gross per 24 hour  Intake 520 ml  Output --  Net 520 ml        Physical Exam: Vital Signs Blood pressure 139/65, pulse 71, temperature 98.4 F (36.9 C), temperature source Oral, resp. rate 17, height _0  (1.803 m), weight 95.2 kg, SpO2 95 %.  General: No acute distress, BMI 29.27 Mood and affect are appropriate Heart: Regular rate and rhythm no rubs murmurs or extra sounds Lungs: diminished BS at bases, breathing unlabored, no rales or wheezes Abdomen: Positive bowel sounds, soft nontender to palpation, nondistended Extremities: No clubbing, cyanosis, or edema Skin: No evidence of breakdown, no evidence of rash Neuro:  Eyes without evidence of nystagmus  Tone is normal without evidence of spasticity Left neglect noted (old R MCA infarct)  Alert and oriented to person place , not time. RUE 4/5, LUE 4/5 proximally and 4/5 distally, bilateral lower extremities 3/5.  Able to tolerate standing.  Pill rolling and intention tremor Soft voice.      Assessment/Plan: 1. Functional deficits which require 3+ hours per day of interdisciplinary therapy in a comprehensive inpatient rehab setting. Physiatrist is providing close team supervision and 24 hour management of active medical problems listed below. Physiatrist and rehab team continue to assess barriers to discharge/monitor patient  progress toward functional and medical goals  Care Tool:  Bathing    Body parts bathed by patient: Right arm, Left arm, Chest, Abdomen, Front perineal area, Buttocks, Right upper leg, Left upper leg, Face   Body parts bathed by helper: Right lower leg, Left lower leg     Bathing assist Assist Level: Minimal Assistance - Patient > 75%     Upper Body Dressing/Undressing Upper body dressing   What is the patient wearing?: Pull over shirt    Upper body assist Assist Level: Maximal Assistance - Patient 25 - 49%    Lower Body Dressing/Undressing Lower body dressing      What is the patient wearing?: Pants, Underwear/pull up     Lower body assist Assist for lower body dressing: Maximal Assistance - Patient 25 - 49%     Toileting Toileting    Toileting assist Assist for toileting: Total Assistance - Patient < 25%     Transfers Chair/bed transfer  Transfers assist     Chair/bed transfer assist level: 2 Helpers     Locomotion Ambulation   Ambulation assist      Assist level: Moderate Assistance - Patient 50 - 74% Assistive device: Walker-rolling Max distance: 25   Walk  10 feet activity   Assist     Assist level: Moderate Assistance - Patient - 50 - 74% Assistive device: Walker-rolling   Walk 50 feet activity   Assist Walk 50 feet with 2 turns activity did not occur: Safety/medical concerns         Walk 150 feet activity   Assist Walk 150 feet activity did not occur: Safety/medical concerns         Walk 10 feet on uneven surface  activity   Assist Walk 10 feet on uneven surfaces activity did not occur: Safety/medical concerns         Wheelchair     Assist Is the patient using a wheelchair?: Yes Type of Wheelchair: Manual    Wheelchair assist level: Total Assistance - Patient < 25% Max wheelchair distance: 150    Wheelchair 50 feet with 2 turns activity    Assist        Assist Level: Total Assistance - Patient <  25%   Wheelchair 150 feet activity     Assist      Assist Level: Total Assistance - Patient < 25%   Blood pressure 139/65, pulse 71, temperature 98.4 F (36.9 C), temperature source Oral, resp. rate 17, height _0  (1.803 m), weight 95.2 kg, SpO2 95 %.  Medical Problem List and Plan: 1. 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  Continue CIR -Interdisciplinary Team Conference today   2.  Impaired mobility:  -DVT/anticoagulation:  Mechanical: Sequential compression devices, below knee Bilateral lower extremities; start Lovenox.              -antiplatelet therapy: aspirin 3. Rib fracture pain: Tylenol, tramadol as needed. Start lidocaine patch. Discussed that this should be applied directly over area of pain 4. Depression: Start trazodone 12m HS LCSW asked to evaluate and provide emotional support             --continue carbamazepine              -antipsychotic agents: Not applicable 5. Neuropsych: This patient is not capable of making decisions on his own behalf. 6. Skin/Wound Care: Routine skin care checks 7. 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 cont IVF at noc repeat  BMET in am  --Monitor urine output and chemistries 16: Transaminitis: Monitor chemistries 17: Hypertension: Continue Norvasc and hydralazine             -- Home Zestoretic held secondary to kidney function. Discussed this with patient.  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  Recall 5 years 21. Prediabetes: check CBGs AC/HS.  22. Hypokalemia: supplement 68mq on 2/17.  23. Suboptimal vitamin D level: continue 2000U daily.  24. Insomnia: start trazodone 552mHS. Start sleep chart. 25. Worsening of cognitive status with no new focal weakness, recent labwork and vitals unremarkable , unremarkable CXR, per Dr XuErlinda Hongepeat CT head twith small punctate hemorrhage, AMS likely multifactorial , hx mild dementia, on benzo, bilateral infarcts. 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 Discussed diagnoses and treatment options with wife and therapy. Recommended outpatient Tai Chi.  27. Overweight BMI 29.27: provide dietary education 28. Decreased initiation: start amantadine today, discussed that this could worsen insomnia 29. Insomnia: increase trazodone to 7564m  LOS: 7 days A FACE TO FACE EVALUATION WAS PERFORMED  KruClide Deutscherulkar 08/05/2021, 11:24 AM

## 2021-08-05 NOTE — Progress Notes (Signed)
Patient ID: William [name], male   DOB: 1940-10-05, 81 y.o.   MRN: [id number]  Met with pt and spoke with wife via telephone due to left to check on their dog at home. Updated regarding team conference goals of min assist and his need to have hands on him due to safety and fluctuation with his levels. She has been here and has seen this with therapies. She is hoping with the new medication MD started this will help his tremors. Aware target discharge date is 3/7. Will work on discharge needs and wife has a Therapist, music in case wants to hire assist to help her at home with him. Hoping this new medication will help pt with his tremors and rigidity.

## 2021-08-05 NOTE — Progress Notes (Signed)
Speech Language Pathology Weekly Progress and Session Note  Patient Details  Name: William [name] MRN: [id number] Date of Birth: [birth year]  Beginning of progress report period: July 31, 2021 End of progress report period: August 05, 2021  Today's Date: 08/05/2021 SLP Individual Time: 1300-1400 SLP Individual Time Calculation (min): 60 min  Short Term Goals: Week 1: SLP Short Term Goal 1 (Week 1): Patient will demonstrate sustained attention to functional tasks for 20 mintues with Mod verbal cues for redirection. SLP Short Term Goal 1 - Progress (Week 1): Not met SLP Short Term Goal 2 (Week 1): Patient will utilize external aids for orientation to place, time and situation with Mod verbal cues. SLP Short Term Goal 2 - Progress (Week 1): Met SLP Short Term Goal 3 (Week 1): Patient will utilize an increaesd vocal intensity at the word level to achieve ~75% intelligibility with Max A multimodal cues. SLP Short Term Goal 3 - Progress (Week 1): Met SLP Short Term Goal 4 (Week 1): Patient will demonstrate improved word-finding at the phrase level with Mod A multitmodal cues. SLP Short Term Goal 4 - Progress (Week 1): Met SLP Short Term Goal 5 (Week 1): Patient will self-monitor and correct errors during verbal expression with Mod A multimodal cues. SLP Short Term Goal 5 - Progress (Week 1): Not met SLP Short Term Goal 6 (Week 1): Pt will consume regular textures and thin liquids with minimal overt s/s aspiration given supervision A verbal cues to use small bites and clear oral cavity in between bites. SLP Short Term Goal 6 - Progress (Week 1): Not met    New Short Term Goals: Week 2: SLP Short Term Goal 1 (Week 2): Patient will demonstrate sustained attention to functional tasks for 5-7 mintues with Mod verbal cues for redirection. SLP Short Term Goal 2 (Week 2): Pt will demonstrate functional problem solving with mod A verbal cues. SLP Short Term Goal 3 (Week 2): Patient will utilize an  increaesd vocal intensity at the word/phrase level with Max A multimodal cues to carryover to next phrase in 30% of opportunties. SLP Short Term Goal 4 (Week 2): Patient will demonstrate improved word-finding in structured tasks (divergent, convergent..etc.) with mod A verbal cues. SLP Short Term Goal 5 (Week 2): Patient will self-monitor and correct errors during verbal expression with Mod A multimodal cues. SLP Short Term Goal 6 (Week 2): Pt will consume regular textures and thin liquids with minimal overt s/s aspiration given supervision A verbal cues to use small bites and clear oral cavity in between bites.  Weekly Progress Updates: Pt made moderate progress meeting 3 out 6 goals. Pt requires max A verbal cues to increase vocal intensity, however is not able to carryover into the next phrase, but demonstrates increase ability in forming simple thoughts at phrase level as well as confrontation of verbs/nouns. Pt required assistance with IADLs and occasional ADLs at baseline due to residual effects from previous CVA. Pt demonstrates acute changes in cognition further supported by a score of 8/30 on SLUMS (n=>27) and higher level word finding. Orientation has improved to min A use of visual aids. Pt is consuming regular textures and thin liquids with full supervision A to assess tolerance, consuming small bites, oral clearance and assist with feeding. Pt's barrier to d/c are cognition deficits (attention, problem solving, emergent awareness, memory and orientation), higher level word finding, producing clear thoughts, speech intelligibility and swallowing safety. Education is ongoing and pt's wife is present for treatment sessions. Pt would  continue to benefit from skilled ST services in order to maximize functional independence and reduce burden of care, likely requiring supervision at discharge with continued skilled ST services.      Intensity: Minumum of 1-2 x/day, 30 to 90 minutes Frequency: 3 to 5  out of 7 days Duration/Length of Stay: 3/7 Treatment/Interventions: Cognitive remediation/compensation;Internal/external aids;Speech/Language facilitation;Therapeutic Activities;Environmental controls;Cueing hierarchy;Functional tasks;Patient/family education;Dysphagia/aspiration precaution training   Daily Session  Skilled Therapeutic Interventions: Skilled ST services focused on cognitive skills. SLP facilitated continued assessment of cognitive linguistic skills utilizing SLUMS, pt scored 8/30 (n=>27) deficits in divergent naming, attention, basic problem solving, error awareness and memory skills. Pt expressed acute cognitive changes, he was unable to elaborate but confirmed with yes/no response. Pt was only able to sequence 2 step picture cards with min A verbal cues, but required max A verbal cues in 3-4 step sequencing task. Verbalizing steps prior to 3-4 step sequencing did not carryover in functional task. Pt was able to sort cards by 2 colors mod I and 3 colors with supervision A verbal cues. Pt's attention began to fade when sorting cards by 4 colors and required mod increasing to max A verbal cues. SLP assisted transfer from [facility] to bed per pt request. Pt was left in room with call bell within reach and bed alarm set. SLP recommends to continue skilled services.     General    Pain    Therapy/Group: Individual Therapy  Sarh Kirschenbaum  [facility] 08/05/2021, 3:10 PM

## 2021-08-06 ENCOUNTER — Ambulatory Visit: Payer: PPO | Admitting: Physical Therapy

## 2021-08-06 LAB — GLUCOSE, CAPILLARY
Glucose-Capillary: 111 mg/dL — ABNORMAL HIGH (ref 70–99)
Glucose-Capillary: 106 mg/dL — ABNORMAL HIGH (ref 70–99)
Glucose-Capillary: 112 mg/dL — ABNORMAL HIGH (ref 70–99)
Glucose-Capillary: 100 mg/dL — ABNORMAL HIGH (ref 70–99)

## 2021-08-06 NOTE — Progress Notes (Signed)
Physical Therapy Session Note  Patient Details  Name: William [name] MRN: [id number] Date of Birth: Nov 08, 1940  Today's Date: 08/06/2021 PT Individual Time: 1535-1630     Short Term Goals: Week 1:  PT Short Term Goal 1 (Week 1): Patient will complete supine <> sit with MinA PT Short Term Goal 2 (Week 1): Patient will compelte sit <> stand with MinA and LRAD PT Short Term Goal 3 (Week 1): Patient will ambulate >84ft with LRAD and MinA PT Short Term Goal 4 (Week 1): Patient will initiate stair training Week 2:  PT Short Term Goal 1 (Week 2): Pt will complete supine<>sit w/ S using bed features PT Short Term Goal 2 (Week 2): Pt will complete sit<>stand minA from lowered bed PT Short Term Goal 3 (Week 2): Pt will ambulate 218ft w/ LRAD minA PT Short Term Goal 4 (Week 2): Pt will complete 4 steps minA w/ LRAD Week 3:     Skilled Therapeutic Interventions/Progress Updates:   Pt received supine in bed and agreeable to PT. Supine>sit transfer with min assist and cues for sequencing to improve trunk control. Donning pants EOB for CGA  For transfers and max assist for clothing management. Stand pivot trsanfer to WC. Pt transported to entrance of [facility]> gait training in cement sidewalk with min assist for AD management on unlevel surface x 160ft, additional gait training with RW in hospital gift shop x 178ft with min assist/CGA for safety and RW management. Pt transported to rehab gym in [facility]. Gait training in parallel bars over agility ladder with min assist to force increased step length. Pt noted to be incontinent bowel movement. Transported to room in [facility]. Sit<>stand from [facility] with CGA and UE supported on bed rail. PT performed clothing management and pericare. Stand pivot transfer to bed with CGA and UE suppported on bed rail. Sit>supine with CGA with cues for improved trunk position. Pt left supine in bed with wife in room.       Therapy Documentation Precautions:  Precautions Precautions:  Fall Precaution Comments: L 11th and 12th rib fxs Restrictions Weight Bearing Restrictions: No  Pain:  denies    Therapy/Group: Individual Therapy  Lorie Phenix 08/07/2021, 7:34 AM

## 2021-08-06 NOTE — Progress Notes (Signed)
Occupational Therapy Session Note  Patient Details  Name: William [name] MRN: [id number] Date of Birth: 19-Mar-1941  Today's Date: 08/06/2021 OT Individual Time: [phone] OT Individual Time Calculation (min): 70 min    Short Term Goals: Week 1:  OT Short Term Goal 1 (Week 1): Pt will performed 2/3 toileting tasks with contact guard OT Short Term Goal 2 (Week 1): Pt will don shirt with mod A OT Short Term Goal 3 (Week 1): Pt will perform toilet transfer (on/off) with contact guard OT Short Term Goal 4 (Week 1): Pt will use bilateral hands in all functional tasks with mod VC OT Short Term Goal 5 (Week 1): Pt will stand at the sink to perform 2 functional tasks with contact guard for balance  Skilled Therapeutic Interventions/Progress Updates:    Pt semi reclined, wife present, pt reports having a better night sleep and no pain.  Pt requesting to wash up and change clothes.  Pt completed supine to sit with bed elevated and bed feature use needing min assist.  Sit to stand using RW with min assist and rocking/counting to encourage forward weight shifting. Requesting to use bathroom and ambulated with min assist. Toilet transfer with hand over hand assist to use grab bars for balance and support.  Pt had small stool continence.  Toileting completed with mod assist.  Ambulated to sink with min assist for RW management.  Pt brushed teeth in standing with CGA.  Pt had incontinent episode of urine in standing leaking onto floor, therefore needing max assist for clothing management and pericare.  Pt doffed shirt with mod assist and washed donned clean shirt with max assist.  He then donned brief and pants with max assist.  Pt beginning to lean forward and becoming rigidly fixed.  Attempted rocking and counting as well as singing favorite song and rocking with beat and able to return to upright position slowly.  Call bell in reach, seat alarm on.  Therapy Documentation Precautions:   Precautions Precautions: Fall Precaution Comments: L 11th and 12th rib fxs Restrictions Weight Bearing Restrictions: No    Therapy/Group: Individual Therapy  Ezekiel Slocumb 08/06/2021, 4:09 PM

## 2021-08-06 NOTE — Progress Notes (Signed)
Speech Language Pathology Daily Session Note  Patient Details  Name: William [name] MRN: [id number] Date of Birth: [birth year]  Today's Date: 08/06/2021 SLP Individual Time: 1430-1500 SLP Individual Time Calculation (min): 30 min  Short Term Goals: Week 2: SLP Short Term Goal 1 (Week 2): Patient will demonstrate sustained attention to functional tasks for 5-7 mintues with Mod verbal cues for redirection. SLP Short Term Goal 2 (Week 2): Pt will demonstrate functional problem solving with mod A verbal cues. SLP Short Term Goal 3 (Week 2): Patient will utilize an increaesd vocal intensity at the word/phrase level with Max A multimodal cues to carryover to next phrase in 30% of opportunties. SLP Short Term Goal 4 (Week 2): Patient will demonstrate improved word-finding in structured tasks (divergent, convergent..etc.) with mod A verbal cues. SLP Short Term Goal 5 (Week 2): Patient will self-monitor and correct errors during verbal expression with Mod A multimodal cues. SLP Short Term Goal 6 (Week 2): Pt will consume regular textures and thin liquids with minimal overt s/s aspiration given supervision A verbal cues to use small bites and clear oral cavity in between bites.  Skilled Therapeutic Interventions: Skilled treatment session focused on communication goals. SLP facilitated session by providing Max A multimodal cues for patient to utilize an increased vocal intensity at the word and phrase level during a structured naming task. Patient required supervision level verbal cues for naming of functional objects and required Min-Mod verbal cues for verbal reasoning during a category exclusion task. Patient with verbal perseveration throughout session which was difficult to redirect. Patient left upright in bed with alarm on and all needs within reach. Continue with current plan of care.      Pain Pain Assessment Pain Scale: 0-10 Pain Score: 0-No pain  Therapy/Group: Individual Therapy  Majd Tissue,  Chris Cripps 08/06/2021, 4:06 PM

## 2021-08-06 NOTE — Progress Notes (Signed)
PROGRESS NOTE   Subjective/Complaints: No new complaints today He is alert and interactive Conitnues to have some left flank pain Wife states he slept well last night  ROS: Cough worse with eating , no CP or SOB, +tremor, pill rolling and intentional, insomnia resolved  Objective:   No results found. Recent Labs    08/05/21 0615  WBC 6.4  HGB 9.1*  HCT 26.7*  PLT 261    Recent Labs    08/05/21 0615  NA 137  K 3.9  CL 109  CO2 21*  GLUCOSE 112*  BUN 21  CREATININE 1.97*  CALCIUM 8.8*     Intake/Output Summary (Last 24 hours) at 08/06/2021 1259 Last data filed at 08/06/2021 0820 Gross per 24 hour  Intake 832 ml  Output 600 ml  Net 232 ml        Physical Exam: Vital Signs Blood pressure (!) 159/70, pulse 71, temperature 98.9 F (37.2 C), resp. rate 17, height 5' 11"  (1.803 m), weight 95.2 kg, SpO2 96 %.  General: No acute distress, BMI 29.27 Mood and affect are appropriate Heart: Regular rate and rhythm no rubs murmurs or extra sounds Lungs: diminished BS at bases, breathing unlabored, no rales or wheezes Abdomen: Positive bowel sounds, soft nontender to palpation, nondistended Extremities: No clubbing, cyanosis, or edema Skin: No evidence of breakdown, no evidence of rash Neuro:  Eyes without evidence of nystagmus  Tone is normal without evidence of spasticity Left neglect noted (old R MCA infarct)  Alert and oriented to person place , not time. RUE 4/5, LUE 4/5 proximally and 4/5 distally, bilateral lower extremities 3/5.  Able to tolerate standing.  Pill rolling and intention tremor Soft voice but intelligeble     Assessment/Plan: 1. Functional deficits which require 3+ hours per day of interdisciplinary therapy in a comprehensive inpatient rehab setting. Physiatrist is providing close team supervision and 24 hour management of active medical problems listed below. Physiatrist and rehab  team continue to assess barriers to discharge/monitor patient progress toward functional and medical goals  Care Tool:  Bathing    Body parts bathed by patient: Right arm, Left arm, Chest, Abdomen, Front perineal area, Buttocks, Right upper leg, Left upper leg, Face   Body parts bathed by helper: Right lower leg, Left lower leg     Bathing assist Assist Level: Minimal Assistance - Patient > 75%     Upper Body Dressing/Undressing Upper body dressing   What is the patient wearing?: Pull over shirt    Upper body assist Assist Level: Maximal Assistance - Patient 25 - 49%    Lower Body Dressing/Undressing Lower body dressing      What is the patient wearing?: Pants, Underwear/pull up     Lower body assist Assist for lower body dressing: Maximal Assistance - Patient 25 - 49%     Toileting Toileting    Toileting assist Assist for toileting: Total Assistance - Patient < 25%     Transfers Chair/bed transfer  Transfers assist     Chair/bed transfer assist level: 2 Helpers     Locomotion Ambulation   Ambulation assist      Assist level: Moderate Assistance - Patient 50 -  74% Assistive device: Walker-rolling Max distance: 25   Walk 10 feet activity   Assist     Assist level: Moderate Assistance - Patient - 50 - 74% Assistive device: Walker-rolling   Walk 50 feet activity   Assist Walk 50 feet with 2 turns activity did not occur: Safety/medical concerns         Walk 150 feet activity   Assist Walk 150 feet activity did not occur: Safety/medical concerns         Walk 10 feet on uneven surface  activity   Assist Walk 10 feet on uneven surfaces activity did not occur: Safety/medical concerns         Wheelchair     Assist Is the patient using a wheelchair?: Yes Type of Wheelchair: Manual    Wheelchair assist level: Total Assistance - Patient < 25% Max wheelchair distance: 150    Wheelchair 50 feet with 2 turns  activity    Assist        Assist Level: Total Assistance - Patient < 25%   Wheelchair 150 feet activity     Assist      Assist Level: Total Assistance - Patient < 25%   Blood pressure (!) 159/70, pulse 71, temperature 98.9 F (37.2 C), resp. rate 17, height 5' 11"  (1.803 m), weight 95.2 kg, SpO2 96 %.  Medical Problem List and Plan: 1. 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  Continue CIR 2.  Impaired mobility:  -DVT/anticoagulation:  Mechanical: Sequential compression devices, below knee Bilateral lower extremities; start Lovenox.              -antiplatelet therapy: aspirin 3. Rib fracture pain: Tylenol, tramadol as needed. Start lidocaine patch. Discussed that this should be applied directly over area of pain 4. Depression: Start trazodone 34m HS LCSW asked to evaluate and provide emotional support             --continue carbamazepine              -antipsychotic agents: Not applicable 5. Neuropsych: This patient is not capable of making decisions on his own behalf. 6. Skin/Wound Care: Routine skin care checks 7. 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 cont IVF at  noc repeat BMET in am  --Monitor urine output and chemistries 16: Transaminitis: Monitor chemistries 17: Hypertension: Continue Norvasc and hydralazine             -- Home Zestoretic held secondary to kidney function. Discussed this with patient.  18: Hyperlipidemia: Continue fenofibrate 19: Bilateral carotid artery stenosis: Asymptomatic             -- Status post bilateral carotid endarterectomies 20: Anemia, chronic  disease, hemodilution etiologies likely: Monitor CBC             -- Fecal occult blood negative 2/13             -- Colonoscopy 2016, polyps.  Recall 5 years 21. Prediabetes: check CBGs AC/HS.  22. Hypokalemia: supplement 37mq on 2/17.  23. Suboptimal vitamin D level: continue 2000U daily.  24. Insomnia: start trazodone 544mHS. Start sleep chart. 25. Worsening of cognitive status with no new focal weakness, recent labwork and vitals unremarkable , unremarkable CXR, per Dr XuErlinda Hongepeat CT head twith small punctate hemorrhage, AMS likely multifactorial , hx mild dementia, on benzo, bilateral infarcts. 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 Discussed diagnoses and treatment options with wife and therapy. Recommended outpatient Tai Chi.  27. Overweight BMI 29.27: provide dietary education 28. Decreased initiation: start amantadine today, discussed that this could worsen insomnia 29. Insomnia: increase trazodone to 7537mcontinue this dose since helping 30. Tremor: discussed risks and benefits of propanolol 31. Urinary incontinence: placed order for bladder program.   LOS: 8 days A FACE TO FACE EVALUATION WAS PERFORMED  KruClide Deutscherulkar 08/06/2021, 12:59 PM

## 2021-08-06 NOTE — Progress Notes (Signed)
Physical Therapy Weekly Progress Note  Patient Details  Name: William [name] MRN: [id number] Date of Birth: [birth year]  Beginning of progress report period: July 30, 2021 End of progress report period: August 06, 2021  Today's Date: 08/06/2021 PT Individual Time: 1012-1045 PT Individual Time Calculation (min): 33 min  and Today's Date: 08/06/2021 PT Missed Time: 25 Minutes Missed Time Reason: Bowel/bladder accident  Patient has met 3 of 4 short term goals. Pt continues to have motor planning deficits and rigidity fluctuating performance in therapy. Pt can complete all activities w/ minA but as he fatigues he becomes maxA to [place]. Supine<>sit goal partially met as pt requires bed rail features to complete minA. Pt modA-maxA supine<>sit w/o bed features due to poor motor planning.  Patient continues to demonstrate the following deficits muscle joint tightness, decreased cardiorespiratoy endurance, impaired timing and sequencing and decreased motor planning, and decreased sitting balance, decreased standing balance, decreased postural control, and decreased balance strategies and therefore will continue to benefit from skilled PT intervention to increase functional independence with mobility.  Patient not progressing toward long term goals.  See goal revision..  Plan of care revisions: Goals downgraded to minA overall, see POC note for details.  PT Short Term Goals Week 1:  PT Short Term Goal 1 (Week 1): Patient will complete supine <> sit with MinA PT Short Term Goal 2 (Week 1): Patient will compelte sit <> stand with MinA and LRAD PT Short Term Goal 3 (Week 1): Patient will ambulate >64f with LRAD and MinA PT Short Term Goal 4 (Week 1): Patient will initiate stair training  Skilled Therapeutic Interventions/Progress Updates:    Pt met supine in bed, wife present, reported no pain, and agreeable to PT w/ min encouragement. Pt completed supine<>sit EOB minA w/ bed rails requiring heavy  VC for motor planning. Pt froze during trunk elevation from side lying, requiring minA to initiate movement. Donned shoes maxA for time and pt requesting to use bathroom. Sit<>stand minA w/ RW from elevated bed after pt unable to stand from lowered bed. Pt ambulated minA w/ RW to bathroom from bed requiring VC for RW management to keep all legs on ground and to avoid obstacles to the L of pt. Pt doffed pants and brief in standing and completed stand<>sit w/ CGA. BM occurred and sit<>stand minA w/ RW to perform totalA peri-care. In standing, pt began to urinate w/o warning and urinal retrieved until pt finished, however shorts and floor were soiled. Pt returned to sitting on toliet and brief and shorts changed totalA. Sit<>stand minA w/ RW to pull pants over hips, and pt stated he was performing another BM once brief donned. Pt sat back on toilet, brief removed, LPN notified and handed off to finish toileting. Pt missed 25 min of therapy due to bowel/bladder.  Therapy Documentation Precautions:  Precautions Precautions: Fall Precaution Comments: L 11th and 12th rib fxs Restrictions Weight Bearing Restrictions: No General:   Therapy/Group: Individual Therapy  CLucile Shutters SPT  08/06/2021, 7:40 AM

## 2021-08-06 NOTE — Plan of Care (Signed)
Downgraded goals due to slow progress.  Problem: Sit to Stand Goal: LTG:  Patient will perform sit to stand with assistance level (PT) Description: LTG:  Patient will perform sit to stand with assistance level (PT) Flowsheets (Taken 08/06/2021 0748) LTG: PT will perform sit to stand in preparation for functional mobility with assistance level: Minimal Assistance - Patient > 75%   Problem: RH Balance Goal: LTG Patient will maintain dynamic sitting balance (PT) Description: LTG:  Patient will maintain dynamic sitting balance with assistance during mobility activities (PT) Flowsheets (Taken 08/06/2021 0748) LTG: Pt will maintain dynamic sitting balance during mobility activities with:: Minimal Assistance - Patient > 75% Goal: LTG Patient will maintain dynamic standing balance (PT) Description: LTG:  Patient will maintain dynamic standing balance with assistance during mobility activities (PT) Flowsheets (Taken 08/06/2021 0748) LTG: Pt will maintain dynamic standing balance during mobility activities with:: Minimal Assistance - Patient > 75%   Problem: Sit to Stand Goal: LTG:  Patient will perform sit to stand with assistance level (PT) Description: LTG:  Patient will perform sit to stand with assistance level (PT) Flowsheets (Taken 08/06/2021 0748) LTG: PT will perform sit to stand in preparation for functional mobility with assistance level: Minimal Assistance - Patient > 75%   Problem: RH Bed Mobility Goal: LTG Patient will perform bed mobility with assist (PT) Description: LTG: Patient will perform bed mobility with assistance, with/without cues (PT). Flowsheets (Taken 08/06/2021 0748) LTG: Pt will perform bed mobility with assistance level of: Minimal Assistance - Patient > 75%   Problem: RH Bed to Chair Transfers Goal: LTG Patient will perform bed/chair transfers w/assist (PT) Description: LTG: Patient will perform bed to chair transfers with assistance (PT). Flowsheets (Taken 08/06/2021  0748) LTG: Pt will perform Bed to Chair Transfers with assistance level: Minimal Assistance - Patient > 75%   Problem: RH Car Transfers Goal: LTG Patient will perform car transfers with assist (PT) Description: LTG: Patient will perform car transfers with assistance (PT). Flowsheets (Taken 08/06/2021 0748) LTG: Pt will perform car transfers with assist:: Minimal Assistance - Patient > 75%   Problem: RH Ambulation Goal: LTG Patient will ambulate in controlled environment (PT) Description: LTG: Patient will ambulate in a controlled environment, # of feet with assistance (PT). Flowsheets Taken 08/06/2021 7341 by [name], Student-PT LTG: Pt will ambulate in controlled environ  assist needed:: Minimal Assistance - Patient > 75% Taken 07/30/2021 1414 by Estevan Ryder A, PT LTG: Ambulation distance in controlled environment: 150 Goal: LTG Patient will ambulate in home environment (PT) Description: LTG: Patient will ambulate in home environment, # of feet with assistance (PT). Flowsheets Taken 08/06/2021 9379 by [name], Student-PT LTG: Pt will ambulate in home environ  assist needed:: Minimal Assistance - Patient > 75% Taken 07/30/2021 1414 by Debbora Dus, PT LTG: Ambulation distance in home environment: 30   Problem: RH Stairs Goal: LTG Patient will ambulate up and down stairs w/assist (PT) Description: LTG: Patient will ambulate up and down # of stairs with assistance (PT) Flowsheets Taken 08/06/2021 0748 by [name], Student-PT LTG: Pt will ambulate up/down stairs assist needed:: Minimal Assistance - Patient > 75% Taken 07/30/2021 1414 by Estevan Ryder A, PT LTG: Pt will  ambulate up and down number of stairs: 1

## 2021-08-07 LAB — GLUCOSE, CAPILLARY
Glucose-Capillary: 102 mg/dL — ABNORMAL HIGH (ref 70–99)
Glucose-Capillary: 108 mg/dL — ABNORMAL HIGH (ref 70–99)
Glucose-Capillary: 111 mg/dL — ABNORMAL HIGH (ref 70–99)
Glucose-Capillary: 112 mg/dL — ABNORMAL HIGH (ref 70–99)

## 2021-08-07 NOTE — Progress Notes (Signed)
Occupational Therapy Weekly Progress Note  Patient Details  Name: William [name] MRN: [id number] Date of Birth: [birth year]  Beginning of progress report period: July 30, 2021 End of progress report period: August 07, 2021  Today's Date: 08/07/2021 OT Individual Time: 700-730; 4098-1191 OT Individual Time Calculation (min): 30 min; 55 min    Patient has met 2 of 4 short term goals and is progressing slowly overall.  He consistently performs better at beginning of session during self care tasks and functional mobility needing only min assist, however nearing the end of the session his postural control and motor planning significantly decline, likely due to fatigue, and he can require max to total assist to safely function.  Pt also exhibits perseveration's during self care and mobility and has a fair response to redirection with multimodal cues and sometimes requires removal of the environmental trigger to move to next task. Trained pt on use of weighted utensils secondary to RUE essential tremor and pt reports they increase his ease with feeding. Pt also exhibits RLE essential tremor which at times interferes with functional mobility performance. Urinary incontinence is a barrier which requires increased assist for toileting when this occurs.  Pt's wife is very involved and supportive and is present for most therapy sessions.   Patient continues to demonstrate the following deficits: muscle weakness, decreased cardiorespiratoy endurance, impaired timing and sequencing, unbalanced muscle activation, motor apraxia, decreased coordination, and decreased motor planning, decreased visual acuity, decreased initiation, decreased attention, decreased awareness, decreased problem solving, decreased safety awareness, decreased memory, and delayed processing, and decreased sitting balance, decreased standing balance, decreased postural control, and decreased balance strategies and therefore will continue to  benefit from skilled OT intervention to enhance overall performance with BADL.  Patient progressing toward long term goals..  Continue plan of care.  OT Short Term Goals Week 1:  OT Short Term Goal 1 (Week 1): Pt will performed 2/3 toileting tasks with contact guard OT Short Term Goal 1 - Progress (Week 1): Progressing toward goal OT Short Term Goal 2 (Week 1): Pt will don shirt with mod A OT Short Term Goal 2 - Progress (Week 1): Met OT Short Term Goal 3 (Week 1): Pt will perform toilet transfer (on/off) with contact guard OT Short Term Goal 3 - Progress (Week 1): Progressing toward goal OT Short Term Goal 4 (Week 1): Pt will use bilateral hands in all functional tasks with mod VC OT Short Term Goal 4 - Progress (Week 1): Met OT Short Term Goal 5 (Week 1): Pt will stand at the sink to perform 2 functional tasks with contact guard for balance OT Short Term Goal 5 - Progress (Week 1): Met Week 2:  OT Short Term Goal 1 (Week 2): Pt will donn shirt with min assist OT Short Term Goal 2 (Week 2): Pt will complete LB dressing with mod assist. OT Short Term Goal 3 (Week 2): Pt will complete sit<>stand with CGA using RW OT Short Term Goal 4 (Week 2): Pt will complete 3/3 toileting with min assist.  Skilled Therapeutic Interventions/Progress Updates:    First session: Pt semi reclined agreeable to eating breakfast during OT session. No c/o pain.  Pt opted to eat sitting up in bed supported.  Educated pt on use of built up weighted utensils.  Pt utilized to self feed breakfast with setup to cut food into small pieces to encourage small bites.  Pt initially pushing food around plate with utensil in right hand and no functional use  of LUE.  Provided cue to initiate using LUE to hold bread and use as a means to push utensil and food against in order to load spoon.  Pt able to carryover skill without cueing thereafter.  Pt began coughing at end of session likely due to bringing second amount of food to mouth  before swallowing food fully.  Pt able to clear food with cues to cough.  Call bell in reach, bed alarm on.  MD and SPT made aware of coughing event.    Second session:  Pt semi reclined in bed, wife present.  Agreeable to transfer training at tubshower level using shower bench.  Pt completed supine to sit using bed features and HOB elevated with min assist.  Stand pivot with min assist using RW to w/c with multimodal cues for rocking technique, RW mgt, and forward weight shift on initial power up.  Pt transported to ADL bathroom and provided with visual demo of transfer technique.  Wife present to observe.  Pt return demonstrated with min assist overall and max multimodal cues. Increased time required for motor planning throughout session.  Pt reporting pain in left ribcage, applied small heat pack and pt reported relief. Returned to room via w/c.  Call bell in reach, seat alarm on.    Therapy Documentation Precautions:  Precautions Precautions: Fall Precaution Comments: L 11th and 12th rib fxs Restrictions Weight Bearing Restrictions: No    Therapy/Group: Individual Therapy  Ezekiel Slocumb 08/07/2021, 2:36 PM

## 2021-08-07 NOTE — Progress Notes (Signed)
PROGRESS NOTE   Subjective/Complaints: No new complaints today Wife notes improvements in tremor and initiation with amantadine.  Strong right lateral lane.  ROS: Cough worse with eating , no CP or SOB, +tremor, pill rolling and intentional- improved, insomnia resolved  Objective:   No results found. Recent Labs    08/05/21 0615  WBC 6.4  HGB 9.1*  HCT 26.7*  PLT 261    Recent Labs    08/05/21 0615  NA 137  K 3.9  CL 109  CO2 21*  GLUCOSE 112*  BUN 21  CREATININE 1.97*  CALCIUM 8.8*     Intake/Output Summary (Last 24 hours) at 08/07/2021 1236 Last data filed at 08/07/2021 0636 Gross per 24 hour  Intake 358 ml  Output 1000 ml  Net -642 ml        Physical Exam: Vital Signs Blood pressure (!) 153/70, pulse 70, temperature 97.8 F (36.6 C), resp. rate 17, height 5' 11" (1.803 m), weight 95.2 kg, SpO2 93 %.  General: No acute distress, BMI 29.27 Mood and affect are appropriate Heart: Regular rate and rhythm no rubs murmurs or extra sounds Lungs: diminished BS at bases, breathing unlabored, no rales or wheezes Abdomen: Positive bowel sounds, soft nontender to palpation, nondistended Extremities: No clubbing, cyanosis, or edema Skin: No evidence of breakdown, no evidence of rash Neuro/MSK  Eyes without evidence of nystagmus  Tone is normal without evidence of spasticity Strong right lateral lean Left neglect noted (old R MCA infarct)  Alert and oriented to person place , not time. RUE 4/5, LUE 4/5 proximally and 4/5 distally, bilateral lower extremities 3/5.  Able to tolerate standing.  Pill rolling and intention tremor Soft voice but intelligeble     Assessment/Plan: 1. Functional deficits which require 3+ hours per day of interdisciplinary therapy in a comprehensive inpatient rehab setting. Physiatrist is providing close team supervision and 24 hour management of active medical problems listed  below. Physiatrist and rehab team continue to assess barriers to discharge/monitor patient progress toward functional and medical goals  Care Tool:  Bathing    Body parts bathed by patient: Right arm, Left arm, Chest, Abdomen, Front perineal area, Buttocks, Right upper leg, Left upper leg, Face   Body parts bathed by helper: Right lower leg, Left lower leg     Bathing assist Assist Level: Minimal Assistance - Patient > 75%     Upper Body Dressing/Undressing Upper body dressing   What is the patient wearing?: Pull over shirt    Upper body assist Assist Level: Maximal Assistance - Patient 25 - 49%    Lower Body Dressing/Undressing Lower body dressing      What is the patient wearing?: Pants, Underwear/pull up     Lower body assist Assist for lower body dressing: Maximal Assistance - Patient 25 - 49%     Toileting Toileting    Toileting assist Assist for toileting: Total Assistance - Patient < 25%     Transfers Chair/bed transfer  Transfers assist     Chair/bed transfer assist level: Minimal Assistance - Patient > 75%     Locomotion Ambulation   Ambulation assist      Assist level:  Minimal Assistance - Patient > 75% Assistive device: Walker-rolling Max distance: 124f   Walk 10 feet activity   Assist     Assist level: Minimal Assistance - Patient > 75% Assistive device: Walker-rolling   Walk 50 feet activity   Assist Walk 50 feet with 2 turns activity did not occur: Safety/medical concerns  Assist level: Minimal Assistance - Patient > 75% Assistive device: Walker-rolling    Walk 150 feet activity   Assist Walk 150 feet activity did not occur: Safety/medical concerns  Assist level: Minimal Assistance - Patient > 75% Assistive device: Walker-rolling    Walk 10 feet on uneven surface  activity   Assist Walk 10 feet on uneven surfaces activity did not occur: Safety/medical concerns         Wheelchair     Assist Is the  patient using a wheelchair?: Yes Type of Wheelchair: Manual    Wheelchair assist level: Total Assistance - Patient < 25% Max wheelchair distance: 150    Wheelchair 50 feet with 2 turns activity    Assist        Assist Level: Total Assistance - Patient < 25%   Wheelchair 150 feet activity     Assist      Assist Level: Total Assistance - Patient < 25%   Blood pressure (!) 153/70, pulse 70, temperature 97.8 F (36.6 C), resp. rate 17, height 5' 11" (1.803 m), weight 95.2 kg, SpO2 93 %.  Medical Problem List and Plan: 1. 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  Continue CIR 2.  Impaired mobility:  -DVT/anticoagulation:  Mechanical: Sequential compression devices, below knee Bilateral lower extremities; Lovenox d/ced given brain hemorrhage.              -antiplatelet therapy: aspirin 3. Rib fracture pain: Tylenol, tramadol as needed. Start lidocaine patch. Discussed that this should be applied directly over area of pain. Provided with pain relief journal 4. Depression: Increase trazodone to 758mHS LCSW asked to evaluate and provide emotional support             --continue carbamazepine              -antipsychotic agents: Not applicable 5. Neuropsych: This patient is not capable of making decisions on his own behalf. 6. Skin/Wound Care: Routine skin care checks 7. 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 cont IVF at noc repeat BMET in am  --Monitor urine output and chemistries 16: Transaminitis: Monitor chemistries 17: Hypertension: Continue Norvasc and hydralazine             -- Home Zestoretic held secondary to kidney function. Discussed this with patient.  18: Hyperlipidemia: Continue fenofibrate 19:  Bilateral carotid artery stenosis: Asymptomatic             -- Status post bilateral carotid endarterectomies 20: Anemia, chronic disease, hemodilution etiologies likely: Monitor CBC             -- Fecal occult blood negative 2/13             -- Colonoscopy 2016, polyps.  Recall 5 years 21. Prediabetes: check CBGs AC/HS.  22. Hypokalemia: supplement 92mq on 2/17.  23. Suboptimal vitamin D level: continue 2000U daily.  24. Insomnia: start trazodone 573mHS. Start sleep chart. 25. Worsening of cognitive status with no new focal weakness, recent labwork and vitals unremarkable , unremarkable CXR, per Dr XuErlinda Hongepeat CT head twith small punctate hemorrhage, AMS likely multifactorial , hx mild dementia, on benzo, bilateral infarcts. 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 Discussed diagnoses and treatment options with wife and therapy. Recommended outpatient Tai Chi.  27. Overweight BMI 29.27: provide dietary education 28. Decreased initiation: start amantadine today, discussed that this could worsen insomnia 29. Insomnia: increase trazodone to 7578mcontinue this dose since helping 30. Tremor: discussed risks and benefits of propanolol 31. Urinary incontinence: placed order for bladder program.   LOS: 9 days A FACE TO FACE EVALUATION WAS PERFORMED  KruIzora Ribas24/2023, 12:36 PM

## 2021-08-07 NOTE — Progress Notes (Signed)
Speech Language Pathology Daily Session Note  Patient Details  Name: William [name] MRN: [id number] Date of Birth: [birth year]  Today's Date: 08/07/2021 SLP Individual Time: 1030-1100 SLP Individual Time Calculation (min): 30 min  Short Term Goals: Week 2: SLP Short Term Goal 1 (Week 2): Patient will demonstrate sustained attention to functional tasks for 5-7 mintues with Mod verbal cues for redirection. SLP Short Term Goal 2 (Week 2): Pt will demonstrate functional problem solving with mod A verbal cues. SLP Short Term Goal 3 (Week 2): Patient will utilize an increaesd vocal intensity at the word/phrase level with Max A multimodal cues to carryover to next phrase in 30% of opportunties. SLP Short Term Goal 4 (Week 2): Patient will demonstrate improved word-finding in structured tasks (divergent, convergent..etc.) with mod A verbal cues. SLP Short Term Goal 5 (Week 2): Patient will self-monitor and correct errors during verbal expression with Mod A multimodal cues. SLP Short Term Goal 6 (Week 2): Pt will consume regular textures and thin liquids with minimal overt s/s aspiration given supervision A verbal cues to use small bites and clear oral cavity in between bites.  Skilled Therapeutic Interventions: Skilled ST treatment focused on communication goals. SLP facilitated session by providing max A multimodal cues for generating up to 3 words within familiar concrete categories (i.e., drinks, fruits, vegetables) and benefited from semantic cues and fill in the blank with written word. Patient also required max A multimodal cues to increase vocal intensity at the word and phrase level. Patient was left in bed with alarm activated and immediate needs within reach at end of session. Continue per current plan of care.      Pain Pain Assessment Pain Scale: 0-10 Pain Score: 0-No pain  Therapy/Group: Individual Therapy  Quinisha Mould T Azia Toutant 08/07/2021, 11:02 AM

## 2021-08-07 NOTE — Progress Notes (Signed)
Physical Therapy Session Note  Patient Details  Name: William [name] MRN: [id number] Date of Birth: 1940-11-11  Today's Date: 08/07/2021 PT Individual Time: 0805-0905 PT Individual Time Calculation (min): 60 min   Short Term Goals: Week 1:  PT Short Term Goal 1 (Week 1): Patient will complete supine <> sit with MinA PT Short Term Goal 2 (Week 1): Patient will compelte sit <> stand with MinA and LRAD PT Short Term Goal 3 (Week 1): Patient will ambulate >17ft with LRAD and MinA PT Short Term Goal 4 (Week 1): Patient will initiate stair training  Skilled Therapeutic Interventions/Progress Updates:  Patient supine in bed on entrance to room. Patient alert and agreeable to PT session.   Patient with no pain complaint throughout session.  Therapeutic Activity: Bed Mobility: Pt requires significant multimodal and consistent cueing with HHA and ModA to reach seated position on EOB. Initial seated balance good but decreases with lift of feet from floor.  Transfers:  For sit<>stand transfers, pt continually reaching for RW and therapist allows pt to fail with each attempt with hands on RW after cues for hand placement on seated surface. With forward lean and push from seat, pt can rise to stand with CGA. Without, pt requires minA to reach stance with simple vc for "up".   Patient performed toilet transfer with modA for RW mgmt, consistent cues for directionality as pt cannot initiate turn in correct direction.  Gait Training:  Patient ambulated around room using RW with MinA for walker mgmt and direction. Demonstrated lift of walker with tilt to R side. Provided vc/ tc for maintaining walker contect with floor and maintaining position within BOS of walker.  Neuromuscular Re-ed: NMR facilitated during session with focus on sitting balance and midline orientation. Pt performs dressing at EOB. Requires modA for donning shorts, ModA for doffing shirt, MinA for donning new t-shirt, MinA for [employer] shoes  with setup. During medicine pass, pt tends to lose balance to R side or posteriorly and leans to elbow. Requires up to max A and continually reaches out to grab therapist's hand to correct. Pt guided throughout in appropriate lean to correct with pt able to follow instructions 50% of time. . NMR performed for improvements in motor control and coordination, balance, sequencing, judgement, and self confidence/ efficacy in performing all aspects of mobility at highest level of independence.   Patient supine  in bed at end of session with brakes locked, bed alarm set, and all needs within reach.     Therapy Documentation Precautions:  Precautions Precautions: Fall Precaution Comments: L 11th and 12th rib fxs Restrictions Weight Bearing Restrictions: No General:   Vital Signs:  Pain: Pain Assessment Pain Scale: 0-10 Pain Score: 0-No pain  Therapy/Group: Individual Therapy  Alger Simons PT, DPT 08/07/2021, 10:18 AM

## 2021-08-08 DIAGNOSIS — G4700 Insomnia, unspecified: Secondary | ICD-10-CM

## 2021-08-08 DIAGNOSIS — N179 Acute kidney failure, unspecified: Secondary | ICD-10-CM

## 2021-08-08 DIAGNOSIS — R7303 Prediabetes: Secondary | ICD-10-CM

## 2021-08-08 DIAGNOSIS — N184 Chronic kidney disease, stage 4 (severe): Secondary | ICD-10-CM

## 2021-08-08 LAB — GLUCOSE, CAPILLARY
Glucose-Capillary: 96 mg/dL (ref 70–99)
Glucose-Capillary: 145 mg/dL — ABNORMAL HIGH (ref 70–99)
Glucose-Capillary: 122 mg/dL — ABNORMAL HIGH (ref 70–99)
Glucose-Capillary: 131 mg/dL — ABNORMAL HIGH (ref 70–99)

## 2021-08-08 NOTE — Progress Notes (Signed)
PROGRESS NOTE   Subjective/Complaints: Patient seen laying in bed this morning.  Wife at bedside.  Patient did not sleep well overnight due to staff coming in the room.  ROS: Denies CP, SOB, N/V/D  Objective:   No results found. No results for input(s): WBC, HGB, HCT, PLT in the last 72 hours.   No results for input(s): NA, K, CL, CO2, GLUCOSE, BUN, CREATININE, CALCIUM in the last 72 hours.    Intake/Output Summary (Last 24 hours) at 08/08/2021 1040 Last data filed at 08/08/2021 0825 Gross per 24 hour  Intake 350 ml  Output 200 ml  Net 150 ml         Physical Exam: Vital Signs Blood pressure (!) 159/73, pulse 72, temperature 98 F (36.7 C), resp. rate 18, height 5' 11"  (1.803 m), weight 95.2 kg, SpO2 96 %. Constitutional: No distress . Vital signs reviewed. HENT: Normocephalic.  Atraumatic. Eyes: EOMI. No discharge. Cardiovascular: No JVD.  RRR. Respiratory: Normal effort.  No stridor.  Bilateral clear to auscultation. GI: Non-distended.  BS +. Skin: Warm and dry.  Intact. Psych: Distracted Musc: No edema in extremities.  No tenderness in extremities. Neuro: Alert Resting tremor Motor: Antigravity strength throughout    Assessment/Plan: 1. Functional deficits which require 3+ hours per day of interdisciplinary therapy in a comprehensive inpatient rehab setting. Physiatrist is providing close team supervision and 24 hour management of active medical problems listed below. Physiatrist and rehab team continue to assess barriers to discharge/monitor patient progress toward functional and medical goals  Care Tool:  Bathing    Body parts bathed by patient: Right arm, Left arm, Chest, Abdomen, Front perineal area, Buttocks, Right upper leg, Left upper leg, Face   Body parts bathed by helper: Right lower leg, Left lower leg     Bathing assist Assist Level: Minimal Assistance - Patient > 75%     Upper Body  Dressing/Undressing Upper body dressing   What is the patient wearing?: Pull over shirt    Upper body assist Assist Level: Maximal Assistance - Patient 25 - 49%    Lower Body Dressing/Undressing Lower body dressing      What is the patient wearing?: Pants, Underwear/pull up     Lower body assist Assist for lower body dressing: Maximal Assistance - Patient 25 - 49%     Toileting Toileting    Toileting assist Assist for toileting: Total Assistance - Patient < 25%     Transfers Chair/bed transfer  Transfers assist     Chair/bed transfer assist level: Minimal Assistance - Patient > 75%     Locomotion Ambulation   Ambulation assist      Assist level: Minimal Assistance - Patient > 75% Assistive device: Walker-rolling Max distance: 12f   Walk 10 feet activity   Assist     Assist level: Minimal Assistance - Patient > 75% Assistive device: Walker-rolling   Walk 50 feet activity   Assist Walk 50 feet with 2 turns activity did not occur: Safety/medical concerns  Assist level: Minimal Assistance - Patient > 75% Assistive device: Walker-rolling    Walk 150 feet activity   Assist Walk 150 feet activity did not occur: Safety/medical concerns  Assist level: Minimal Assistance - Patient > 75% Assistive device: Walker-rolling    Walk 10 feet on uneven surface  activity   Assist Walk 10 feet on uneven surfaces activity did not occur: Safety/medical concerns         Wheelchair     Assist Is the patient using a wheelchair?: Yes Type of Wheelchair: Manual    Wheelchair assist level: Total Assistance - Patient < 25% Max wheelchair distance: 150    Wheelchair 50 feet with 2 turns activity    Assist        Assist Level: Total Assistance - Patient < 25%   Wheelchair 150 feet activity     Assist      Assist Level: Total Assistance - Patient < 25%   Blood pressure (!) 159/73, pulse 72, temperature 98 F (36.7 C), resp. rate  18, height 5' 11"  (1.803 m), weight 95.2 kg, SpO2 96 %.  Medical Problem List and Plan: 1. 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  Impaired mobility:  -DVT/anticoagulation:  Mechanical: Sequential compression devices, below knee Bilateral lower extremities; Lovenox d/ced given brain hemorrhage.              -antiplatelet therapy: aspirin 3. Rib fracture pain: Tylenol, tramadol as needed. Start lidocaine patch. Discussed that this should be applied directly over area of pain. Provided with pain relief journal 4. Depression: Increase trazodone to 77m HS LCSW asked to evaluate and provide emotional support             --continue carbamazepine              -antipsychotic agents: Not applicable 5. Neuropsych: This patient is not capable of making decisions on his own behalf. 6. Skin/Wound Care: Routine skin care checks 7. 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  Creatinine 1.97 on 2/22, labs ordered for Monday --Monitor urine output and chemistries 16: Transaminitis: Monitor chemistries 17: Hypertension: Continue Norvasc and hydralazine             -- Home Zestoretic held secondary to kidney function.  18: Hyperlipidemia: Continue fenofibrate 19: Bilateral carotid artery stenosis: Asymptomatic              -- Status post bilateral carotid endarterectomies 20: Anemia, chronic disease, hemodilution etiologies likely: Monitor CBC             -- Fecal occult blood negative 2/13             -- Colonoscopy 2016, polyps.  Recall 5 years 21. Prediabetes: check CBGs AC/HS.   Controlled on 2/25 22. Hypokalemia: supplement 212m on 2/17.  23. Suboptimal vitamin D level: continue 2000U daily.  24. Insomnia: Started trazodone 5089mS, increased to 75 start sleep chart.  Appears to be improving 25. Worsening of cognitive status with no new focal weakness, recent labwork and vitals unremarkable , unremarkable CXR, per Dr XuErlinda Hong  repeat CT head twith small punctate hemorrhage, AMS likely multifactorial , hx mild dementia, on benzo, bilateral infarcts. 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 Discussed diagnoses and treatment options with wife and therapy. Recommended outpatient Tai Chi.  27. Overweight BMI 29.27: provide dietary education 28. Decreased initiation: start amantadine today, discussed that this could worsen insomnia 29. Tremor: discussed risks and benefits of propanolol 30. Urinary incontinence: placed order for bladder program.   LOS: 10 days A FACE TO FACE EVALUATION WAS PERFORMED  Disney Ruggiero Lorie Phenix 08/08/2021, 10:40 AM

## 2021-08-08 NOTE — Progress Notes (Signed)
Occupational Therapy Session Note  Patient Details  Name: William [name] MRN: [id number] Date of Birth: Jan 17, 1941  Today's Date: 08/08/2021 OT Individual Time: 1304-1402 OT Individual Time Calculation (min): 58 min    Short Term Goals: Week 2:  OT Short Term Goal 1 (Week 2): Pt will donn shirt with min assist OT Short Term Goal 2 (Week 2): Pt will complete LB dressing with mod assist. OT Short Term Goal 3 (Week 2): Pt will complete sit<>stand with CGA using RW OT Short Term Goal 4 (Week 2): Pt will complete 3/3 toileting with min assist.  Skilled Therapeutic Interventions/Progress Updates:    Pt in bed to start with his spouse present at beginning of session.  He was agreeable to transfer to the EOB and working on dressing tasks, as he did not want to work on bathing.  Min guard assist for transfer from supine to sit EOB.  He was able to donn a pullover shirt with min assist for threading the LUE.  He needed mod assist with mod demonstrational cueing for donning a pull up shorts.  As therapist tried to educate him on hemi technique for crossing the LLE over the right ankle to donn shorts over his foot.  He would constantly uncross it and place it back on the floor.  Once dressing was complete, he was able to ambulate over to the sink with min assist using the RW for support to complete oral hygiene.  He perseverated on rinsing his denture cup, although it was empty and he had no intention of taking his dentures out.  Mod instructional cueing to sequence oral hygiene including setup of supplies as well as completion of task.  Increased perseveration also noted when brushing teeth as pt needed cueing to stop.  Once grooming tasks were completed, he was able to transfer to the wheelchair and was taken down to the ortho gym for work on standing balance, LUE coordination, and cognitive processing.  Throughout transport down to the gym, pt would try to place the walker beside of him when his wheelchair  stopped, requiring cueing from therapist to keep hold of it why he was pushed.  Worked on standing balance with use of the BITS.  He was able to complete 3 intervals of standing with min guard assist while integrating the LUE during Visual Scanning Tasks.  The first interval lasted for 2 mins with pt completing task with 72% accuracy and in and average of 3.48 seconds for each blue dot.  On second attempt therapist had him try to locate and find numbers 1-30 in sequence.  He needed max instructional cueing to attempt and demonstrated moderate difficulty locating numbers in sequence.  He was only able to find 6 numbers in 4 mins of standing with mod questioning cueing to look for the correct number as he would constantly press a number such as "16" when looking for the 6.  On the final set numbers were reduced down from 1-10 with pt still needing mod instructional cueing to locate them in 1:30 with average reaction time at 9 seconds per number.  Finished session with return to the room and transfer back to the bed with min assist.  He was left with the call button and phone in reach and safety alarm belt in place.    Therapy Documentation Precautions:  Precautions Precautions: Fall Precaution Comments: L 11th and 12th rib fxs Restrictions Weight Bearing Restrictions: No  Pain: Pain Assessment Pain Scale: Faces Pain Score: 0-No  pain ADL: See Care Tool Section for some details of mobility and selfcare   Therapy/Group: Individual Therapy  Rondall Radigan OTR/L 08/08/2021, 3:35 PM

## 2021-08-08 NOTE — Progress Notes (Signed)
Speech Language Pathology Daily Session Note  Patient Details  Name: REGINALD WEIDA MRN: [id number] Date of Birth: [birth year]  Today's Date: 08/08/2021 SLP Individual Time: 1346-1435 SLP Individual Time Calculation (min): 49 min  Short Term Goals: Week 2: SLP Short Term Goal 1 (Week 2): Patient will demonstrate sustained attention to functional tasks for 5-7 mintues with Mod verbal cues for redirection. SLP Short Term Goal 2 (Week 2): Pt will demonstrate functional problem solving with mod A verbal cues. SLP Short Term Goal 3 (Week 2): Patient will utilize an increaesd vocal intensity at the word/phrase level with Max A multimodal cues to carryover to next phrase in 30% of opportunties. SLP Short Term Goal 4 (Week 2): Patient will demonstrate improved word-finding in structured tasks (divergent, convergent..etc.) with mod A verbal cues. SLP Short Term Goal 5 (Week 2): Patient will self-monitor and correct errors during verbal expression with Mod A multimodal cues. SLP Short Term Goal 6 (Week 2): Pt will consume regular textures and thin liquids with minimal overt s/s aspiration given supervision A verbal cues to use small bites and clear oral cavity in between bites.  Skilled Therapeutic Interventions: Skilled ST services focused on language skills. SLP facilitated assessment of reading utilizing RCAB in hopes to use visual aids assist in ceasing preservation during functional sequencing steps. Pt demonstrated decoding at the word and sentence level with 100% accuracy. Pt demonstrated 80% increasing to 90% accuracy and required max A verbal cues fading in the intensity of cuing; for example "which word is axe?" To " what is this? Which word is that?" When asked to match the black/white drawling to the word in a field of 3 in subsections ( visual, auditory and semantic confusion.) Pt was able to demonstrate reading comprehension in functional reading subsection at the sentence level in 6/8  opportunities with max A verbal cues. Pt required a max A level of cues due to preservation verse language comprehension. Pt was left in room with pt's wife. Recommend to continue ST services.      Pain Pain Assessment Pain Scale: Faces Pain Score: 0-No pain  Therapy/Group: Individual Therapy  Azariel Banik  [facility] 08/08/2021, 3:55 PM

## 2021-08-09 LAB — GLUCOSE, CAPILLARY
Glucose-Capillary: 105 mg/dL — ABNORMAL HIGH (ref 70–99)
Glucose-Capillary: 144 mg/dL — ABNORMAL HIGH (ref 70–99)
Glucose-Capillary: 96 mg/dL (ref 70–99)
Glucose-Capillary: 112 mg/dL — ABNORMAL HIGH (ref 70–99)

## 2021-08-09 NOTE — Progress Notes (Signed)
Physical Therapy Session Note  Patient Details  Name: William [name] MRN: [id number] Date of Birth: 06-Apr-1941  Today's Date: 08/09/2021 PT Individual Time: 0910-1006 PT Individual Time Calculation (min): 56 min   Short Term Goals: Week 2:  PT Short Term Goal 1 (Week 2): Pt will complete supine<>sit w/ S using bed features PT Short Term Goal 2 (Week 2): Pt will complete sit<>stand minA from lowered bed PT Short Term Goal 3 (Week 2): Pt will ambulate 266ft w/ LRAD minA PT Short Term Goal 4 (Week 2): Pt will complete 4 steps minA w/ LRAD  Skilled Therapeutic Interventions/Progress Updates:    Pt received supine in bed with his wife, [name], present and pt agreeable to therapy session. Pt continues to demo impaired motor planning requiring explicit step-by-step cuing to sequence where to place hands during transfer, how to turn AD when going to sit, and to ensure stepping back fully prior to initiating sit - pt's wife reports he required this level of cuing at baseline.   Supine>sitting R EOB, HOB slightly elevated, requires step-by-step cuing for sequencing and therapist facilitating how to use UEs to assist with transitioning trunk upright - min assist to achieve upright - pt unable to problem solve how to come to sitting without cuing. Sit>stand slightly elevated EOB>RW with light min assist for rising to stand - cuing to push up from bed. Gait ~29ft into bathroom using RW with light min assist for balance and AD management as pt tending to flip/tilt AD towards R and have slight R lean - continues to have short, shuffled gait pattern (pt's wife reporting this present at baseline as well). Standing with CGA during total assist LB clothing management - pt noted to be incontinent of bladder in brief and unable to void further on BSC over toilet. Standing with CGA using RW and grab bar support during dependent assist peri-care. Gait ~72ft to w/c using RW with light min assist - continues to demo above  gait deviations and poor AD management with pt also noticed to frequently bump into items on L side.  Transitioned to standing at sink using RW support while completing oral care - pt with impaired motor planning/sequencing how to manage holding toothbrush while putting toothpaste on brush - therapist attempted to provide step-by-step cuing with pt responding "you're annoying" and pt's wife reporting pt no longer has a "filter" for what is appropriate or not - pt meaning no harm in what he said. Pt also noted to have decreased attention and initiation of L UE use into mobility tasks. Pt also noted to perseverate on certain movements/tasks such as rinsing off his toothbrush or wiping the sink clean afterward - requires therapist cuing to transition to next task. Tolerated standing ~2 minutes at sink prior to reporting B LE fatigue - therapist provided chair for seated rest.   Transported to/from gym in w/c for time management and energy conservation. Stand pivot w/c<>Nustep using RW with min assist as described above. Performed B UE and B LE reciprocal movement patterns against level 4 resistance for 4 minutes totaling 275 steps - cuing throughout for increased size of movements to achieve full knee extension bilaterally.   Gait training ~152ft back to his room with CGA - continues to have short, shuffled steps with increased trunk flexion though slightly improved. At end of session, pt left seated in recliner with needs in reach, seat belt alarm on, pt's wife and the nurse present.   Therapy Documentation Precautions:  Precautions  Precautions: Fall Precaution Comments: L 11th and 12th rib fxs Restrictions Weight Bearing Restrictions: No   Pain:  No complaints of pain during session.   Therapy/Group: Individual Therapy  Tawana Scale , PT, DPT, NCS, CSRS  08/09/2021, 7:50 AM

## 2021-08-10 ENCOUNTER — Ambulatory Visit: Payer: PPO | Admitting: Family Medicine

## 2021-08-10 ENCOUNTER — Ambulatory Visit: Payer: PPO | Admitting: Physical Therapy

## 2021-08-10 LAB — BASIC METABOLIC PANEL
Anion gap: 7 (ref 5–15)
BUN: 18 mg/dL (ref 8–23)
CO2: 22 mmol/L (ref 22–32)
Calcium: 8.6 mg/dL — ABNORMAL LOW (ref 8.9–10.3)
Chloride: 109 mmol/L (ref 98–111)
Creatinine, Ser: 2.01 mg/dL — ABNORMAL HIGH (ref 0.61–1.24)
GFR, Estimated: 33 mL/min — ABNORMAL LOW (ref >60)
Glucose, Bld: 117 mg/dL — ABNORMAL HIGH (ref 70–99)
Potassium: 4.2 mmol/L (ref 3.5–5.1)
Sodium: 138 mmol/L (ref 135–145)

## 2021-08-10 LAB — URINALYSIS, ROUTINE W REFLEX MICROSCOPIC
Bilirubin Urine: NEGATIVE
Glucose, UA: NEGATIVE mg/dL
Ketones, ur: NEGATIVE mg/dL
Nitrite: NEGATIVE
Protein, ur: NEGATIVE mg/dL
Specific Gravity, Urine: 1.009 (ref 1.005–1.030)
WBC, UA: >50 WBC/hpf — ABNORMAL HIGH (ref 0–5)
pH: 6 (ref 5.0–8.0)

## 2021-08-10 LAB — GLUCOSE, CAPILLARY
Glucose-Capillary: 101 mg/dL — ABNORMAL HIGH (ref 70–99)
Glucose-Capillary: 110 mg/dL — ABNORMAL HIGH (ref 70–99)
Glucose-Capillary: 108 mg/dL — ABNORMAL HIGH (ref 70–99)
Glucose-Capillary: 102 mg/dL — ABNORMAL HIGH (ref 70–99)

## 2021-08-10 MED ORDER — SODIUM CHLORIDE 0.9 % IV SOLN: INTRAVENOUS | Status: AC

## 2021-08-10 MED ORDER — CAMPHOR-MENTHOL 0.5-0.5 % EX LOTN
TOPICAL_LOTION | CUTANEOUS | Status: DC | PRN
  Filled 2021-08-10: qty 222

## 2021-08-10 NOTE — Progress Notes (Signed)
Occupational Therapy Session Note  Patient Details  Name: William [name] MRN: [id number] Date of Birth: 1941-03-15  Today's Date: 08/10/2021 OT Individual Time: 0905-1000 OT Individual Time Calculation (min): 55 min    Short Term Goals: Week 2:  OT Short Term Goal 1 (Week 2): Pt will donn shirt with min assist OT Short Term Goal 2 (Week 2): Pt will complete LB dressing with mod assist. OT Short Term Goal 3 (Week 2): Pt will complete sit<>stand with CGA using RW OT Short Term Goal 4 (Week 2): Pt will complete 3/3 toileting with min assist.  Skilled Therapeutic Interventions/Progress Updates:    Pt semi reclined in bed, wife present for first half of session.  Pt with no c/o pain just finishing up with wife assisting in use of urinal.  Pt requesting to go into bathroom again despite just having urinated a small amount.  Wife agreeable to family education and training and assist pt with supine to sit with min assist using bed features, sit to stand at RW and ambulation of ~8 feet to bathroom commode.  Therapist educated pts wife on importance of achieving safe standing position in front of toilet prior to doffing pts brief due to distraction causing pt difficulty with motor planning during functional mobility.  Pt unable to successfully urinate while on toilet.  Pt ambulated to sink using RW with CGA.   Wife provided CGA and multimodal cues while pt completed oral hygiene.  Pt reporting feeling fatigued and requesting to sit.  Pt sat with CGA and cues for safe hand placement.  Doffed/donn shirt overhead with max multimodal cues and mod assist due to difficulty motor planning (wife provided assist).  Pts wife left and therapist provided min assist for LB bathing and mod assist for LB dressing.  Pt requesting to lay back down due to fatigue.  Pt ambulated to bed with RW needing min assist for RW mgt and balance.  Stand to sit and sit to supine with CGA and increased time.  Call bell in reach, bed alarm on  .  Therapy Documentation Precautions:  Precautions Precautions: Fall Precaution Comments: L 11th and 12th rib fxs Restrictions Weight Bearing Restrictions: No   Therapy/Group: Individual Therapy  Ezekiel Slocumb 08/10/2021, 1:17 PM

## 2021-08-10 NOTE — Progress Notes (Signed)
Physical Therapy Session Note  Patient Details  Name: William [name] MRN: [id number] Date of Birth: 08-Oct-1940  Today's Date: 08/10/2021 PT Individual Time: 1130-1200 PT Individual Time Calculation (min): 30 min   Short Term Goals: Week 1:  PT Short Term Goal 1 (Week 1): Patient will complete supine <> sit with MinA PT Short Term Goal 2 (Week 1): Patient will compelte sit <> stand with MinA and LRAD PT Short Term Goal 3 (Week 1): Patient will ambulate >57ft with LRAD and MinA PT Short Term Goal 4 (Week 1): Patient will initiate stair training Week 2:  PT Short Term Goal 1 (Week 2): Pt will complete supine<>sit w/ S using bed features PT Short Term Goal 2 (Week 2): Pt will complete sit<>stand minA from lowered bed PT Short Term Goal 3 (Week 2): Pt will ambulate 220ft w/ LRAD minA PT Short Term Goal 4 (Week 2): Pt will complete 4 steps minA w/ LRAD  Skilled Therapeutic Interventions/Progress Updates:  Patient supine in bed on entrance to room. Patient alert and agreeable to PT session. Wife arrives halfway through session.   Patient with no pain complaint throughout session.  Therapeutic Activity: Bed Mobility: Patient performed supine --> sit with CGA for push up to seated position on EOB. VC/ tc required for technique. Return to supine with CGA for BLE and positioning of UB.  Transfers: Patient performed sit<>stand and stand pivot transfers throughout session with heavy vc for technique/ forward lean as well as hand placement as pt wants to place hands on RW for use in assisting to stand. Pt allowed to fail and provided with constant vc/ tc for Bil hand placement on seated surface in order to push self up to stand. With proper hand positioning and push-to-stand technique, pt is able to perform with MinA for power up and up to CGA.   Gait Training:  Patient ambulated in-room distances as well as 150' x1 including on 180 degree turn using RW with CGA/ light MinA for balance and managing RW.  Provided vc/ tc for maintaining RW contact with floor (improving) and .  Neuromuscular Re-ed: NMR facilitated during session with focus on standing balance and sit<>stand performance. Performed from EOB for increased difficulty from lower height seat and no armrests available x4. Guided in push-to-stand technique as well as forward lean for appropriate weight shift. Performed minisquats for equalized balance over BLE.   NMR performed for improvements in motor control and coordination, balance, sequencing, judgement, and self confidence/ efficacy in performing all aspects of mobility at highest level of independence.   Patient supine  in bed at end of session per pt request with brakes locked, bed alarm set, and all needs within reach. Wife present.     Therapy Documentation Precautions:  Precautions Precautions: Fall Precaution Comments: L 11th and 12th rib fxs Restrictions Weight Bearing Restrictions: No General:   Vital Signs: Therapy Vitals Temp: 98.4 F (36.9 C) Temp Source: Oral Pulse Rate: 73 Resp: 18 BP: 137/72 Patient Position (if appropriate): Lying Oxygen Therapy SpO2: 98 % O2 Device: Room Air Pain: Pain Assessment Pain Score: 0-No pain  Therapy/Group: Individual Therapy  Alger Simons PT, DPT 08/10/2021, 4:51 PM

## 2021-08-10 NOTE — Progress Notes (Signed)
Speech Language Pathology Daily Session Note  Patient Details  Name: William [name] MRN: [id number] Date of Birth: [birth year]  Today's Date: 08/10/2021 SLP Individual Time: 1300-1345 SLP Individual Time Calculation (min): 45 min  Short Term Goals: Week 2: SLP Short Term Goal 1 (Week 2): Patient will demonstrate sustained attention to functional tasks for 5-7 mintues with Mod verbal cues for redirection. SLP Short Term Goal 2 (Week 2): Pt will demonstrate functional problem solving with mod A verbal cues. SLP Short Term Goal 3 (Week 2): Patient will utilize an increaesd vocal intensity at the word/phrase level with Max A multimodal cues to carryover to next phrase in 30% of opportunties. SLP Short Term Goal 4 (Week 2): Patient will demonstrate improved word-finding in structured tasks (divergent, convergent..etc.) with mod A verbal cues. SLP Short Term Goal 5 (Week 2): Patient will self-monitor and correct errors during verbal expression with Mod A multimodal cues. SLP Short Term Goal 6 (Week 2): Pt will consume regular textures and thin liquids with minimal overt s/s aspiration given supervision A verbal cues to use small bites and clear oral cavity in between bites.  Skilled Therapeutic Interventions:Skilled ST services focused on cognitive skills. SLP facilitated reading and sequencing skills in ADL task. SLP and pt created simple 3 steps to brushing teeth and SLP recorded list. Pt demonstrated ability to read 3 steps/simple phrases mod I. SLP instructed pt to utilize list when he demonstrates preservation. Pt was able to complete ADL task, brushing teeth with min A verbal cues for use of written aid to cease preservation and move to next step. SLP requested pt's wife video pt to provided bio-feed back of low vocal intensity. Pt was aware of reduced vocal intensity, but only able to increase with max A verbal cues with no carryover to the next word. SLP facilitated convergent and divergent naming,  pt required reduced cues for convergent naming compared to previous session, only requiring supervision A semantic cues for 90% accuracy.  Pt was able to increase divergent naming list of fruits from 4 to 9 within a minute given mod A subcategory cues. SLP suspects impairments in attention and motor perseveration play apart in difficulty with divergent naming compared to language deficit. Pt was left in room with call bell within reach and bed alarm set. SLP recommends to continue skilled services.     Pain Pain Assessment Pain Score: 0-No pain  Therapy/Group: Individual Therapy  Tora Prunty  [facility] 08/10/2021, 3:41 PM

## 2021-08-10 NOTE — Progress Notes (Signed)
PROGRESS NOTE   Subjective/Complaints: More frequent urination last night and was incontinent. There was foul odor. UA/UC ordered His wife gives me [facility] forms for additional support for her at home.   ROS: Denies CP, SOB, N/V/D, slept better at night  Objective:   No results found. No results for input(s): WBC, HGB, HCT, PLT in the last 72 hours.   Recent Labs    08/10/21 1038  NA 138  K 4.2  CL 109  CO2 22  GLUCOSE 117*  BUN 18  CREATININE 2.01*  CALCIUM 8.6*     Intake/Output Summary (Last 24 hours) at 08/10/2021 1507 Last data filed at 08/10/2021 1257 Gross per 24 hour  Intake 2530.03 ml  Output 100 ml  Net 2430.03 ml        Physical Exam: Vital Signs Blood pressure 137/72, pulse 73, temperature 98.4 F (36.9 C), temperature source Oral, resp. rate 18, height _0  (1.803 m), weight 95.2 kg, SpO2 98 %. Constitutional: No distress . Vital signs reviewed. HENT: Normocephalic.  Atraumatic. Eyes: EOMI. No discharge. Cardiovascular: No JVD.  RRR. Respiratory: Normal effort.  No stridor.  Bilateral clear to auscultation. GI: Non-distended.  BS +. Skin: Warm and dry.  Intact. Psych: Distracted Musc: No edema in extremities.  No tenderness in extremities. Neuro: Alert Resting tremor, frequent tongue movements Motor: Antigravity strength throughout    Assessment/Plan: 1. Functional deficits which require 3+ hours per day of interdisciplinary therapy in a comprehensive inpatient rehab setting. Physiatrist is providing close team supervision and 24 hour management of active medical problems listed below. Physiatrist and rehab team continue to assess barriers to discharge/monitor patient progress toward functional and medical goals  Care Tool:  Bathing    Body parts bathed by patient: Right arm, Left arm, Chest, Abdomen, Front perineal area, Buttocks, Right upper leg, Left upper leg, Face   Body  parts bathed by helper: Right lower leg, Left lower leg     Bathing assist Assist Level: Minimal Assistance - Patient > 75%     Upper Body Dressing/Undressing Upper body dressing   What is the patient wearing?: Pull over shirt    Upper body assist Assist Level: Maximal Assistance - Patient 25 - 49%    Lower Body Dressing/Undressing Lower body dressing      What is the patient wearing?: Pants, Underwear/pull up     Lower body assist Assist for lower body dressing: Maximal Assistance - Patient 25 - 49%     Toileting Toileting    Toileting assist Assist for toileting: Total Assistance - Patient < 25%     Transfers Chair/bed transfer  Transfers assist     Chair/bed transfer assist level: Minimal Assistance - Patient > 75%     Locomotion Ambulation   Ambulation assist      Assist level: Minimal Assistance - Patient > 75% Assistive device: Walker-rolling Max distance: 116f   Walk 10 feet activity   Assist     Assist level: Minimal Assistance - Patient > 75% Assistive device: Walker-rolling   Walk 50 feet activity   Assist Walk 50 feet with 2 turns activity did not occur: Safety/medical concerns  Assist level: Minimal  Assistance - Patient > 75% Assistive device: Walker-rolling    Walk 150 feet activity   Assist Walk 150 feet activity did not occur: Safety/medical concerns  Assist level: Minimal Assistance - Patient > 75% Assistive device: Walker-rolling    Walk 10 feet on uneven surface  activity   Assist Walk 10 feet on uneven surfaces activity did not occur: Safety/medical concerns         Wheelchair     Assist Is the patient using a wheelchair?: Yes Type of Wheelchair: Manual    Wheelchair assist level: Total Assistance - Patient < 25% Max wheelchair distance: 150    Wheelchair 50 feet with 2 turns activity    Assist        Assist Level: Total Assistance - Patient < 25%   Wheelchair 150 feet activity      Assist      Assist Level: Total Assistance - Patient < 25%   Blood pressure 137/72, pulse 73, temperature 98.4 F (36.9 C), temperature source Oral, resp. rate 18, height $RemoveBe'5\' 11"'IcjodOgLG$  (1.803 m), weight 95.2 kg, SpO2 98 %.  Medical Problem List and Plan: 1. 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 2.  Impaired mobility:  -DVT/anticoagulation:  Mechanical: Sequential compression devices, below knee Bilateral lower extremities; Lovenox d/ced given brain hemorrhage.              -antiplatelet therapy: aspirin 3. Rib fracture pain: Tylenol, tramadol as needed. Start lidocaine patch. Discussed that this should be applied directly over area of pain. Provided with pain relief journal 4. Depression: Increase trazodone to $RemoveBefo'75mg'LlGwDAimCMI$  HS LCSW asked to evaluate and provide emotional support             --continue carbamazepine              -antipsychotic agents: Not applicable 5. Neuropsych: This patient is not capable of making decisions on his own behalf. 6. Skin/Wound Care: Routine skin care checks 7. 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  Creatinine 1.97 on 2/22, labs ordered for Monday --Monitor urine output and chemistries 16: Transaminitis: Monitor chemistries 17: Hypertension: Continue  Norvasc and hydralazine             -- Home Zestoretic held secondary to kidney function.  18: Hyperlipidemia: Continue fenofibrate 19: Bilateral carotid artery stenosis: Asymptomatic             -- Status post bilateral carotid endarterectomies 20: Anemia, chronic disease, hemodilution etiologies likely: Monitor CBC             -- Fecal occult blood negative 2/13             -- Colonoscopy 2016, polyps.  Recall 5 years 21. Prediabetes: check CBGs AC/HS.   Controlled on 2/25 22. Hypokalemia: supplement 109meq on 2/17.  23. Suboptimal vitamin D level: continue 2000U daily.  24. Insomnia: Started trazodone $RemoveBeforeDEI'50mg'iISIHajibnRUqYtI$   HS, increased to 75 start sleep chart.  Appears to be improving 25. Worsening of cognitive status with no new focal weakness, recent labwork and vitals unremarkable , unremarkable CXR, per Dr Erlinda Hong repeat CT head twith small punctate hemorrhage, AMS likely multifactorial , hx mild dementia, on benzo, bilateral infarcts. UA negative- d/c keflex. Resolved.  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 Discussed diagnoses and treatment options with wife and therapy. Recommended outpatient Tai Chi.  27. Overweight BMI 29.27: provide dietary education 28. Decreased initiation: start amantadine today, discussed that this could worsen insomnia 29. Tremor: discussed risks and benefits of propanolol. Continue amantadine which is helping.  30. Urinary incontinence: placed order for bladder program. UA/UC ordered.  31. Pruritis: Sarna ordered.   LOS: 12 days A FACE TO FACE EVALUATION WAS PERFORMED  William [name] 08/10/2021, 3:07 PM

## 2021-08-10 NOTE — Progress Notes (Signed)
Physical Therapy Session Note  Patient Details  Name: William [name] MRN: [id number] Date of Birth: February 04, 1941  Today's Date: 08/10/2021 PT Individual Time: 1430-1530 PT Individual Time Calculation (min): 60 min   Short Term Goals: Week 2:  PT Short Term Goal 1 (Week 2): Pt will complete supine<>sit w/ S using bed features PT Short Term Goal 2 (Week 2): Pt will complete sit<>stand minA from lowered bed PT Short Term Goal 3 (Week 2): Pt will ambulate 237ft w/ LRAD minA PT Short Term Goal 4 (Week 2): Pt will complete 4 steps minA w/ LRAD  Skilled Therapeutic Interventions/Progress Updates:     Patient in bed upon PT arrival. Patient alert and agreeable to PT session. Patient denied pain during session.  Patient oriented to self, location, and situation, disoriented to time (no calendar or date in patient's room). Patient presents with B upper extremity tremors R>L and intermittent lower extremity tremors R>L, and verbal perseveration, often repeating what therapist said throughout session.  Patient incontinent of bladder at beginning of session.   Therapeutic Activity: Bed Mobility: Patient performed rolling R/L with mod A for changing incontinence brief and peri-care with max A. Donned and doffed shorts bed level with mod A with PT holding shorts for patient the thread his feet through, then patient bridging and pulling up/down his shorts with min cues and supervision. He performed supine to/from sit in a flat bed with use of bed rails with min A for trunk and lower extremity management and increased time for motor planning. Provided verbal cues for transitioning through side-lying with use of elbows for trunk control. Patient sat EOB with supervision, donned and doffed B slip on shoes with min A for management of the heel of the shoe.  Transfers: Patient performed sit to/from stand x3 with min A-CGA using RW. Provided verbal cues for hand placement and forward weight shift.  Gait Training:   Patient ambulated >120 feet x2 using RW on first trial with min A-CGA and R arm over therapist's shoulder on second trial with min A. Ambulated with crouched shuffling gait with RW, holding RW off the floor on the L. Notable improved posture and step length with arm around therapist's shoulder with facilitation for forward propulsion. Provided verbal cues for increased step length and height, erect posture, looking ahead, and safety with AD.  Neuromuscular Re-ed: Patient performed the following lower extremity motor activities for reduced limb and trunk rigidity with a reciprocal pattern: -NuStep x10 min on WL 4, with cues to maintain >75 SPM and move through full range, required manual assist to meet full range x2 min before able to maintain on his own; played classic rock music for patient throughout with patient signing lyrics, provided cues for increased volume with singing with poor carryover  Patient in bed with his wife in the room at end of session with breaks locked, bed alarm set, and all needs within reach. Patient's wife updated on patient's progress during session.   Therapy Documentation Precautions:  Precautions Precautions: Fall Precaution Comments: L 11th and 12th rib fxs Restrictions Weight Bearing Restrictions: No    Therapy/Group: Individual Therapy  Shequita Peplinski L Kenji Mapel PT, DPT  08/10/2021, 4:08 PM

## 2021-08-11 ENCOUNTER — Ambulatory Visit: Payer: PPO | Admitting: Physical Therapy

## 2021-08-11 LAB — URINE CULTURE

## 2021-08-11 LAB — GLUCOSE, CAPILLARY
Glucose-Capillary: 97 mg/dL (ref 70–99)
Glucose-Capillary: 96 mg/dL (ref 70–99)
Glucose-Capillary: 110 mg/dL — ABNORMAL HIGH (ref 70–99)
Glucose-Capillary: 105 mg/dL — ABNORMAL HIGH (ref 70–99)

## 2021-08-11 NOTE — Plan of Care (Signed)
°  Problem: RH Expression Communication Goal: LTG Patient will increase speech intelligibility (SLP) Description: LTG: Patient will increase speech intelligibility at word/phrase/conversation level with cues, % of the time (SLP) Flowsheets (Taken 08/11/2021 1649) LTG: Patient will increase speech intelligibility (SLP): (poor carryover of vocal intensity) Moderate Assistance - Patient 50 - 74% Note: Poor carryover of vocal intensity

## 2021-08-11 NOTE — Progress Notes (Signed)
Occupational Therapy Session Note  Patient Details  Name: William [name] MRN: [id number] Date of Birth: 09/06/1940  Today's Date: 08/11/2021 OT Individual Time: 1020-1115 OT Individual Time Calculation (min): 55 min    Short Term Goals: Week 2:  OT Short Term Goal 1 (Week 2): Pt will donn shirt with min assist OT Short Term Goal 2 (Week 2): Pt will complete LB dressing with mod assist. OT Short Term Goal 3 (Week 2): Pt will complete sit<>stand with CGA using RW OT Short Term Goal 4 (Week 2): Pt will complete 3/3 toileting with min assist.   Skilled Therapeutic Interventions/Progress Updates:    Pt semi reclined in bed, wife present and agreeable to family education during this session.  Pts wife reports having difficulty assisting in bed mobility.  Therapist educated pt and wife regarding body mechanics and hand placement with bed feature use during supine to sit and pt completed with mod assist.  Wife assisted pt in sit to stand and ambulation to sink using RW with min assist and supervision by therapist. Pt doffed/donned shirt overhead with wife assisting and providing min assist.  Donned shorts at sit<>stand level with mod assist provided by wife.  Therapist gave verbal cues to wife to offer extra time to pt for motor planning and processing.  Wife requesting to practice shower bench transfer in pt's bathroom due to similar tight spacing as bathroom at home. After therapist reviewed with pt and wife regarding safe transfer technique, wife assisted pt to bathroom with min assist and max multimodal cues including safe RW mgt and sequencing of transfer.  Pt completed shower bench transfer with min assist, significantly increased time to complete and use of grab bars.    Pt then transferred to small gym and completed 10 minutes on level 2 nustep to increase strength and endurance.  Transported back to room and stand pivot back to bed with min assist.  Pt participated in therapeutic activity to  promote LUE functional use and reach including grasp and place horse shoes and stacking hockey pucks on table top. Call bell in reach, bed alarm on.    Therapy Documentation Precautions:  Precautions Precautions: Fall Precaution Comments: L 11th and 12th rib fxs Restrictions Weight Bearing Restrictions: No    Therapy/Group: Individual Therapy  Ezekiel Slocumb 08/11/2021, 12:35 PM

## 2021-08-11 NOTE — Progress Notes (Signed)
Speech Language Pathology Weekly Progress and Session Note  Patient Details  Name: William [name] MRN: [id number] Date of Birth: Jul 16, 1940  Beginning of progress report period: August 05, 2021 End of progress report period: August 11, 2021  Today's Date: 08/11/2021 SLP Individual Time: 1346-1430 SLP Individual Time Calculation (min): 44 min  Short Term Goals: Week 2: SLP Short Term Goal 1 (Week 2): Patient will demonstrate sustained attention to functional tasks for 5-7 mintues with Mod verbal cues for redirection. SLP Short Term Goal 1 - Progress (Week 2): Met SLP Short Term Goal 2 (Week 2): Pt will demonstrate functional problem solving with mod A verbal cues. SLP Short Term Goal 2 - Progress (Week 2): Met SLP Short Term Goal 3 (Week 2): Patient will utilize an increaesd vocal intensity at the word/phrase level with Max A multimodal cues to carryover to next phrase in 30% of opportunties. SLP Short Term Goal 3 - Progress (Week 2): Not met SLP Short Term Goal 4 (Week 2): Patient will demonstrate improved word-finding in structured tasks (divergent, convergent..etc.) with mod A verbal cues. SLP Short Term Goal 4 - Progress (Week 2): Met SLP Short Term Goal 5 (Week 2): Patient will self-monitor and correct errors during verbal expression with Mod A multimodal cues. SLP Short Term Goal 5 - Progress (Week 2): Not met SLP Short Term Goal 6 (Week 2): Pt will consume regular textures and thin liquids with minimal overt s/s aspiration given supervision A verbal cues to use small bites and clear oral cavity in between bites. SLP Short Term Goal 6 - Progress (Week 2): Met    New Short Term Goals: Week 3: SLP Short Term Goal 1 (Week 3): STG=LTG due to ELOS 3/7  Weekly Progress Updates: Pt made moderate progress this reporting period. Pt demonstrated improvements in swallow function, sustained attention, problem solving, and error awareness. Pt's barriers continue to be verbal and motor  preservation in functional tasks as well as reduced vocal intensity. Pt is able to increase vocal intensity, but no carryover to the next phrase. SLP downgraded speech goals. Education is on going with pt's wife. Pt would continue to benefit from skilled ST services in order to maximize functional independence and reduce burden of care, requiring 24 hour supervision at discharge with continued skilled ST services.     Intensity: Minumum of 1-2 x/day, 30 to 90 minutes Frequency: 3 to 5 out of 7 days Duration/Length of Stay: 3/7 Treatment/Interventions: Cognitive remediation/compensation;Internal/external aids;Speech/Language facilitation;Therapeutic Activities;Environmental controls;Cueing hierarchy;Functional tasks;Patient/family education;Dysphagia/aspiration precaution training   Daily Session  Skilled Therapeutic Interventions:  Skilled ST services focused on cognitive skills. SLP facilitated sustained attention, basic problem solving, error awareness and recall in novel card task Blink. Pt demonstrated carryover of skills requiring mod A fade to min A verbal cues for problem solving, error awareness, recall with aid and sustained attention up to 10 minutes. Pt required max-mod A verbal tactile cues to break motor preservation in task. SLP utilized written aids to sequence steps in card task, however it did not reduce the amount of cuing required to cease preservation. Pt was left in room with call bell within reach and bed alarm set. SLP recommends to continue skilled services.    General    Pain Pain Assessment Pain Score: 0-No pain  Therapy/Group: Individual Therapy  William [name]  [facility] 08/11/2021, 4:38 PM

## 2021-08-11 NOTE — Progress Notes (Signed)
Physical Therapy Session Note  Patient Details  Name: William [name] MRN: [id number] Date of Birth: Mar 05, 1941  Today's Date: 08/11/2021 PT Individual Time: 7425-9563 PT Individual Time Calculation (min): 53 min   Short Term Goals: Week 1:  PT Short Term Goal 1 (Week 1): Patient will complete supine <> sit with MinA PT Short Term Goal 2 (Week 1): Patient will compelte sit <> stand with MinA and LRAD PT Short Term Goal 3 (Week 1): Patient will ambulate >40ft with LRAD and MinA PT Short Term Goal 4 (Week 1): Patient will initiate stair training Week 2:  PT Short Term Goal 1 (Week 2): Pt will complete supine<>sit w/ S using bed features PT Short Term Goal 2 (Week 2): Pt will complete sit<>stand minA from lowered bed PT Short Term Goal 3 (Week 2): Pt will ambulate 256ft w/ LRAD minA PT Short Term Goal 4 (Week 2): Pt will complete 4 steps minA w/ LRAD  Skilled Therapeutic Interventions/Progress Updates:   Received pt semi-reclined in bed, pt agreeable to PT treatment, and denied any pain during session. Session with emphasis on functional mobility/transfers, toileting, generalized strengthening and endurance, dynamic standing balance/coordination, and gait training. Pt transferred semi-reclined<>sitting EOB with HOB elevated and use of bedrails with min A and cues for sequencing due to poor motor planning. Pt reported urge to use restroom but noticed no RW in room - provided pt with RW and pt stood with RW and min A and ambulated 42ft x 2 trials with RW and min A in/out of bathroom - noted pt with tendency to lift RW up while walking; therefore required assist to keep RW on ground and to steer. Pt required max multimodal cues for sequencing turning and with poor eccentric control resulting in posterior LOB onto commode. Pt found to be incontinent of urine. Removed soiled brief and pt sat on commode but ultimately unable to further void. Donned clean brief with max A and pants sitting with max A.  Sit<>stand with RW and min A from commode and required mod A to pull pants over hips. Pt's wife arrived and NT present to check vitals. Pt's wife requesting to take pt outside after therapy - secure chatted MD and requested grounds pass. Pt transported to/from room in [facility] dependently for time management purposes. Sit<>stand with RW and min A and pt ambulated 116ft x 2 trials with RW and min A through [employer]. Pt ambulates with shuffling gait pattern, decreased bilateral step length and foot clearance, flexed trunk/downward gaze, L inattention (coming close to running into wall on L side), and demonstrates difficulty with sequencing with RW frequently lifting RW in air rather than pushing it along ground. Pt required manual facilitation to control RW and cues to take "big steps". Pt reported feeling "pooped" after ambulating. Concluded session with pt sitting in WC with posey belt on left in care of wife, preparing to take pt outside for fresh air - RN aware  Therapy Documentation Precautions:  Precautions Precautions: Fall Precaution Comments: L 11th and 12th rib fxs Restrictions Weight Bearing Restrictions: No  Therapy/Group: Individual Therapy Alfonse Alpers PT, DPT   08/11/2021, 7:15 AM

## 2021-08-11 NOTE — Progress Notes (Signed)
PROGRESS NOTE   Subjective/Complaints: Seen ambulating well in the hallway this morning Urine culture with multiple species, repeat ordered  ROS: Denies CP, SOB, N/V/D, slept better at night, +urinary frequency  Objective:   No results found. No results for input(s): WBC, HGB, HCT, PLT in the last 72 hours.   Recent Labs    08/10/21 1038  NA 138  K 4.2  CL 109  CO2 22  GLUCOSE 117*  BUN 18  CREATININE 2.01*  CALCIUM 8.6*     Intake/Output Summary (Last 24 hours) at 08/11/2021 1210 Last data filed at 08/11/2021 0300 Gross per 24 hour  Intake 1244.68 ml  Output --  Net 1244.68 ml        Physical Exam: Vital Signs Blood pressure 138/66, pulse 71, temperature 98 F (36.7 C), resp. rate 15, height _0  (1.803 m), weight 95.2 kg, SpO2 94 %. Constitutional: No distress . Vital signs reviewed. HENT: Normocephalic.  Atraumatic. Eyes: EOMI. No discharge. Cardiovascular: No JVD.  RRR. Respiratory: Normal effort.  No stridor.  Bilateral clear to auscultation. GI: Non-distended.  BS +. Skin: Warm and dry.  Intact. Psych: Distracted Musc: No edema in extremities.  No tenderness in extremities. Neuro: Alert Resting tremor of hands and legs, frequent tongue movements Motor: Antigravity strength throughout    Assessment/Plan: 1. Functional deficits which require 3+ hours per day of interdisciplinary therapy in a comprehensive inpatient rehab setting. Physiatrist is providing close team supervision and 24 hour management of active medical problems listed below. Physiatrist and rehab team continue to assess barriers to discharge/monitor patient progress toward functional and medical goals  Care Tool:  Bathing    Body parts bathed by patient: Right arm, Left arm, Chest, Abdomen, Front perineal area, Buttocks, Right upper leg, Left upper leg, Face   Body parts bathed by helper: Right lower leg, Left lower leg      Bathing assist Assist Level: Minimal Assistance - Patient > 75%     Upper Body Dressing/Undressing Upper body dressing   What is the patient wearing?: Pull over shirt    Upper body assist Assist Level: Maximal Assistance - Patient 25 - 49%    Lower Body Dressing/Undressing Lower body dressing      What is the patient wearing?: Pants, Underwear/pull up     Lower body assist Assist for lower body dressing: Maximal Assistance - Patient 25 - 49%     Toileting Toileting    Toileting assist Assist for toileting: Total Assistance - Patient < 25%     Transfers Chair/bed transfer  Transfers assist     Chair/bed transfer assist level: Minimal Assistance - Patient > 75%     Locomotion Ambulation   Ambulation assist      Assist level: Minimal Assistance - Patient > 75% Assistive device: Walker-rolling Max distance: 132f   Walk 10 feet activity   Assist     Assist level: Minimal Assistance - Patient > 75% Assistive device: Walker-rolling   Walk 50 feet activity   Assist Walk 50 feet with 2 turns activity did not occur: Safety/medical concerns  Assist level: Minimal Assistance - Patient > 75% Assistive device: Walker-rolling    Walk  150 feet activity   Assist Walk 150 feet activity did not occur: Safety/medical concerns  Assist level: Minimal Assistance - Patient > 75% Assistive device: Walker-rolling    Walk 10 feet on uneven surface  activity   Assist Walk 10 feet on uneven surfaces activity did not occur: Safety/medical concerns         Wheelchair     Assist Is the patient using a wheelchair?: Yes Type of Wheelchair: Manual    Wheelchair assist level: Total Assistance - Patient < 25% Max wheelchair distance: 150    Wheelchair 50 feet with 2 turns activity    Assist        Assist Level: Total Assistance - Patient < 25%   Wheelchair 150 feet activity     Assist      Assist Level: Total Assistance - Patient <  25%   Blood pressure 138/66, pulse 71, temperature 98 F (36.7 C), resp. rate 15, height $RemoveBe'5\' 11"'AdGpCkizw$  (1.803 m), weight 95.2 kg, SpO2 94 %.  Medical Problem List and Plan: 1. 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 2.  Impaired mobility:  -DVT/anticoagulation:  Mechanical: Sequential compression devices, below knee Bilateral lower extremities; Lovenox d/ced given brain hemorrhage.              -antiplatelet therapy: aspirin 3. Rib fracture pain: Tylenol, tramadol as needed. Start lidocaine patch. Discussed that this should be applied directly over area of pain. Provided with pain relief journal 4. Depression: Increase trazodone to $RemoveBefo'75mg'HdDNKchDtMp$  HS LCSW asked to evaluate and provide emotional support             --continue carbamazepine              -antipsychotic agents: Not applicable 5. Neuropsych: This patient is not capable of making decisions on his own behalf. 6. Skin/Wound Care: Routine skin care checks 7. 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  Creatinine 1.97 on 2/22, 2.01 on 2/28, monitor weekly.  --Monitor urine output and chemistries 16: Transaminitis: Monitor chemistries 17: Hypertension: Continue Norvasc and hydralazine. Continue heart healthy diet             -- Home Zestoretic held  secondary to kidney function.  18: Hyperlipidemia: Continue fenofibrate 19: Bilateral carotid artery stenosis: Asymptomatic             -- Status post bilateral carotid endarterectomies 20: Anemia, chronic disease, hemodilution etiologies likely: Monitor CBC             -- Fecal occult blood negative 2/13             -- Colonoscopy 2016, polyps.  Recall 5 years 21. Prediabetes: check CBGs AC/HS.   Controlled on 2/25 22. Hypokalemia: supplement 38meq on 2/17.  23. Suboptimal vitamin D level: continue 2000U daily.  24. Insomnia: Started trazodone $RemoveBeforeDEI'50mg'NPlBNykpgMjogRap$  HS, increased to 75 start sleep chart.  Appears  to be improving 25. Worsening of cognitive status with no new focal weakness, recent labwork and vitals unremarkable , unremarkable CXR, per Dr Erlinda Hong repeat CT head twith small punctate hemorrhage, AMS likely multifactorial , hx mild dementia, on benzo, bilateral infarcts. UA negative- d/c keflex. Resolved.  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 Discussed diagnoses and treatment options with wife and therapy. Recommended outpatient Tai Chi.  27. Overweight BMI 29.27: provide dietary education 28. Decreased initiation: start amantadine today, discussed that this could worsen insomnia 29. Tremor: discussed risks and benefits of propanolol. Continue amantadine which is helping.  30. Urinary incontinence: placed order for bladder program. UA/UC ordered and UC with multiple species, repeat UC ordered  31. Pruritis: Sarna ordered.   LOS: 13 days A FACE TO FACE EVALUATION WAS PERFORMED  Clide Deutscher Salim Forero 08/11/2021, 12:10 PM

## 2021-08-11 NOTE — Progress Notes (Signed)
Physical Therapy Session Note  Patient Details  Name: William [name] MRN: [id number] Date of Birth: March 02, 1941  Today's Date: 08/11/2021 PT Individual Time: 0820-0900 PT Individual Time Calculation (min): 40 min   Short Term Goals: Week 1:  PT Short Term Goal 1 (Week 1): Patient will complete supine <> sit with MinA PT Short Term Goal 2 (Week 1): Patient will compelte sit <> stand with MinA and LRAD PT Short Term Goal 3 (Week 1): Patient will ambulate >60ft with LRAD and MinA PT Short Term Goal 4 (Week 1): Patient will initiate stair training  Skilled Therapeutic Interventions/Progress Updates:    Pt in bed on arrival with wife and NT performing brief change. Allowed several minutes for brief change to maintain pt dignity. Pt continues to demo flat effect, soft voice, festinating gait pattern, and perseveration at times.Therapist then assisted with threading pants in supine, pt able to pull pants over hips with supervision and min A to pull up in back once rolling. Supine>sit with min A and VC for technique and LE management. Pt continued to state he was cold, so donned button up shirt with mod A. Pt performed stand pivot with no AD and min A, using armrest of w/c for balance. When RW placed in front of pt, pt used both hands on R hand grip, even with repeated VC and visual cues, removed RW for safer transfer at this time. Pt requested several minutes to eat. Pt missed ~20 min for brief change and breakfast. Rest of session focused on gait training, with and w/o AD. Pt walked x 100 ft and x 200 ft with RW, x 140 ft with HHA for improved upright posture. Occ standing rest breaks d/t fatigue. With HHA, pt skimmed wall, furniture walking with RUE. Cues trhoughout for longer steps with mod improvement at times, but quickly returning to shuffling gait pattern. On second bout with RW, employed music for improved mechanics. Mod improvements at times, but limited by fatigue. Pt required less cueing for  improvements when walking to classic rock. Pt returned to room and sat in w/c, remained with his wife present and all needs in reach.   Therapy Documentation Precautions:  Precautions Precautions: Fall Precaution Comments: L 11th and 12th rib fxs Restrictions Weight Bearing Restrictions: No    Therapy/Group: Individual Therapy  Mickel Fuchs 08/11/2021, 8:24 AM

## 2021-08-12 ENCOUNTER — Ambulatory Visit: Payer: PPO | Admitting: Physical Therapy

## 2021-08-12 LAB — BASIC METABOLIC PANEL
Anion gap: 9 (ref 5–15)
BUN: 18 mg/dL (ref 8–23)
CO2: 20 mmol/L — ABNORMAL LOW (ref 22–32)
Calcium: 8.8 mg/dL — ABNORMAL LOW (ref 8.9–10.3)
Chloride: 109 mmol/L (ref 98–111)
Creatinine, Ser: 1.97 mg/dL — ABNORMAL HIGH (ref 0.61–1.24)
GFR, Estimated: 34 mL/min — ABNORMAL LOW (ref >60)
Glucose, Bld: 103 mg/dL — ABNORMAL HIGH (ref 70–99)
Potassium: 4.1 mmol/L (ref 3.5–5.1)
Sodium: 138 mmol/L (ref 135–145)

## 2021-08-12 LAB — CBC
HCT: 28.3 % — ABNORMAL LOW (ref 39.0–52.0)
Hemoglobin: 9.2 g/dL — ABNORMAL LOW (ref 13.0–17.0)
MCH: 32.2 pg (ref 26.0–34.0)
MCHC: 32.5 g/dL (ref 30.0–36.0)
MCV: 99 fL (ref 80.0–100.0)
Platelets: 258 10*3/uL (ref 150–400)
RBC: 2.86 MIL/uL — ABNORMAL LOW (ref 4.22–5.81)
RDW: 13.3 % (ref 11.5–15.5)
WBC: 6.1 10*3/uL (ref 4.0–10.5)
nRBC: 0 % (ref 0.0–0.2)

## 2021-08-12 LAB — GLUCOSE, CAPILLARY
Glucose-Capillary: 105 mg/dL — ABNORMAL HIGH (ref 70–99)
Glucose-Capillary: 106 mg/dL — ABNORMAL HIGH (ref 70–99)
Glucose-Capillary: 145 mg/dL — ABNORMAL HIGH (ref 70–99)

## 2021-08-12 LAB — URINE CULTURE: Culture: <10000 — AB

## 2021-08-12 MED ORDER — CEPHALEXIN 250 MG PO CAPS
500.0000 mg | ORAL_CAPSULE | Freq: Two times a day (BID) | ORAL | Status: AC
  Administered 2021-08-12 – 2021-08-18 (×13): 500 mg via ORAL
  Filled 2021-08-12 (×13): qty 2

## 2021-08-12 NOTE — Progress Notes (Signed)
Occupational Therapy Session Note ? ?Patient Details  ?Name: William [name] ?MRN: [id number] ?Date of Birth: 02-Aug-1940 ? ?Today's Date: 08/12/2021 ?OT Individual Time: 4174-0814 ?OT Individual Time Calculation (min): 45 min  ? ? ?Short Term Goals: ?Week 2:  OT Short Term Goal 1 (Week 2): Pt will donn shirt with min assist ?OT Short Term Goal 2 (Week 2): Pt will complete LB dressing with mod assist. ?OT Short Term Goal 3 (Week 2): Pt will complete sit<>stand with CGA using RW ?OT Short Term Goal 4 (Week 2): Pt will complete 3/3 toileting with min assist. ? ? ?Skilled Therapeutic Interventions/Progress Updates:  ?  Pt semi reclined in bed, wife present, both agreeable to attempting bathing at shower level.  Wife assisted with bed mobility with therapist present for cueing wife as needed to ensure safe assist.  Pt completed supine to sit with HOB elevated using bed rails with mod assist and increased time.  Sit to stand at [facility] and ambulated to bathroom with min assist for obstacle maneuvering and RW mgt.  Stand to sit at shower bench with increased time and pt needing cues of counting and rocking to 3 as well as safe hand placement due to pt having difficulty initiating transfer.  Pt required mod assist for lateral scoot into shower using shower bench by therapist due to having difficulty motor planning. Therapist applied waterproof dressing over IV on RUE and left pain patch. Wife assisted pt in bathing with max assist for energy conservation purposes.  Pt was able to wash some of UB and hair.  After drying off therapist assist pt in lateral scoot transfer with mod assist.  Stand pivot using RW shower bench to w/c with min assist and max multimodal cues.  Pt donned shirt seated with min assist and brief in standing with total assist.  Pt reporting significant fatigue after showering and requesting back to bed.  Stand pivot w/c to EOB and sit to supine with min assist and increased time.  Waterproof dressings removed and  only slight redness noted right above pain patch, no redness over RUE.  Requesting to shave and brush teeth and wife able to assist appropriately with these tasks pt at bed level supported sitting position.  Call bell in reach, bed alarm on. ? ?Therapy Documentation ?Precautions:  ?Precautions ?Precautions: Fall ?Precaution Comments: L 11th and 12th rib fxs ?Restrictions ?Weight Bearing Restrictions: No ? ? ? ?Therapy/Group: Individual Therapy ? ?Caryl Asp Rohit Deloria ?08/12/2021, 1:02 PM ?

## 2021-08-12 NOTE — Patient Care Conference (Signed)
Inpatient RehabilitationTeam Conference and Plan of Care Update ?Date: 08/12/2021   Time: 11:03 AM  ? ? ?Patient Name: William [name]      ?Medical Record Number: [id number]  ?Date of Birth: 08-Mar-1941 ?Sex: Male         ?Room/Bed: 5C10C/5C10C-01 ?Payor Info: Payor: [employer] / Plan: [employer] PPO / Product Type: *No Product type* /   ? ?Admit Date/Time:  07/29/2021  3:34 PM ? 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 ?Hospital Problems: Principal Problem: ?  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  Insomnia ?  Prediabetes ?  Chronic kidney disease (CKD), stage IV (severe) (HCC) ? ? ? ?Expected Discharge Date: Expected Discharge Date: 08/18/21 ? ?Team Members Present: ?Physician leading conference: Dr. Leeroy Cha ?Social Worker Present: Ovidio Kin, LCSW ?Nurse Present: Dorien Chihuahua, RN ?PT Present: Excell Seltzer, PT ?OT Present: Leretha Pol, OT ?SLP Present: Sherren Kerns, SLP ?PPS Coordinator present : Gunnar Fusi, SLP ? ?   Current Status/Progress Goal Weekly Team Focus  ?Bowel/Bladder ? ? continent vs incont. Last BM-2/28  regain full cont.  assess q shift and PRN   ?Swallow/Nutrition/ Hydration ? ? regular textures and thin liquids  Supervision A  eductation and tolerance of diet   ?ADL's ? ? performance is still fluctuating however overall endurance appears to be improving able to complete sinkside self care with min assist for UB and mod-max assist for LB; when fatigued, pt can be max to total assist  need to downgrade due to fluctuations  caregiver training and education, endurance training, functional transfer training, balance, LUE neuro re-ed.   ?Mobility ? ? mod A bed mobility, min A transfers with RW, min A gait x 200 ft with RW or HHA, can become max to total A when fatigued  downgraded to min A overall  bed mobility, transfers, balance, gait, d/c planning, family edu   ?Communication ? ? Max A increasing vocal intensity, Mod-Min A word finding   Mod-Min A - downgraded on 2/28  education, speech intelligibility strategies, higher level word finding   ?Safety/Cognition/ Behavioral Observations ? Mod A, perservation largest issue  MIn A  education, sustained attention, recall, basic problem solving and error awareness   ?Pain ? ? denies pain  pain <3  assess pain q shift and PRN   ?Skin ? ? skin intact  no skin breakdown  assess skin q shift and PRN   ? ? ?Discharge Planning:  ?Wife here daily and observing in therpaies. preparing to take him home next week will need to do hands on education next week prior to discharge.   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 Patient with motor planning issues , fatigue and poor endurance. ?Keflex for suspected UTI. IVF at [facility] for AKI. Sleep chart ongoing. ?Patient on target to meet rehab goals: ?Functional level fluctuates from min - max assist.  Patient has issues with perseverative motor issues which affects speech intelligibility, and intensity. Also note word finding issues,  and disorientation.  Goals for discharge set for min assist overall. ? ?*See Care Plan and progress notes for long and short-term goals.  ? ?Revisions to Treatment Plan:  ?Downgraded goals for communication  ?  ?Teaching Needs: ?Safety, medications, secondary risk management, transfers, toileting, etc  ?Current Barriers to Discharge: ?Decreased caregiver support ? ?Possible Resolutions to Barriers: ?Family education with wife ?HH follow up services arranged ?DME: BSC, Shower bench,  and w/c ?  ? ? 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 Barriers to Discharge: Medical stability;Decreased family/caregiver support;Behavior ? 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 ? ?Continued Need for Acute Rehabilitation Level of Care: The patient requires daily medical management by a physician with specialized training in physical medicine and rehabilitation for the following reasons: ?Direction of a multidisciplinary physical rehabilitation program to maximize functional independence : Yes ?Medical management of patient stability for increased activity during participation in an intensive rehabilitation regime.: Yes ?Analysis of laboratory values and/or radiology reports with any subsequent need for medication adjustment and/or medical intervention. : Yes ? ? ?I attest that I was present, lead the team conference, and concur with the assessment and plan of the team. ? ? ?Dorien Chihuahua B ?08/12/2021, 5:03 PM  ? ? ? ? ? ? ?

## 2021-08-12 NOTE — Progress Notes (Signed)
Physical Therapy Session Note ? ?Patient Details  ?Name: William [name] ?MRN: [id number] ?Date of Birth: 13-Mar-1941 ? ?Today's Date: 08/12/2021 ?PT Individual Time: 1005-1100 ?PT Individual Time Calculation (min): 55 min  ? ?Short Term Goals: ?Week 2:  PT Short Term Goal 1 (Week 2): Pt will complete supine<>sit w/ S using bed features ?PT Short Term Goal 2 (Week 2): Pt will complete sit<>stand minA from lowered bed ?PT Short Term Goal 3 (Week 2): Pt will ambulate 251ft w/ LRAD minA ?PT Short Term Goal 4 (Week 2): Pt will complete 4 steps minA w/ LRAD ? ?Skilled Therapeutic Interventions/Progress Updates:  ?   ?Patient in bed upon PT arrival. Patient alert and agreeable to PT session. Patient denied pain during session. ? ?Therapeutic Activity: ?Bed Mobility: Patient performed supine to/from sit with CGA-supervision. Provided verbal cues for transitioning through side-lying. ?Transfers: Patient performed stand pivot bed>w/c with CGA sit to/from stand x4 with CGA-min A without an AD. Provided verbal cues for forward and upward trajectory to come to standing with external cues. ? ?Gait Training:  ?Patient ambulated ~200 feet x1 using one arm over PT's shoulder x1 and no AD with CGA-min A. Ambulated with crouched shuffling gait. Notable improved posture and step length with arm around therapist's shoulder with good carryover without arm support with facilitation for forward propulsion. Provided verbal cues for increased step length and height, erect posture, looking ahead ? ?Neuromuscular Re-ed: ?Patient performed the following standing dynamic balance activities for improved postural control and trunk rotation: ?-standing >10 min playing Wii golf x3 holes on beginner level, par 3, +5, +8 for each trial, provided CGA for balance and max cues for recall of controls and taking a "big swing." ? ?Patient in bed with his wife at bedside at end of session with breaks locked, bed alarm set, and all needs within reach. Plan for   ? ?Therapy Documentation ?Precautions:  ?Precautions ?Precautions: Fall ?Precaution Comments: L 11th and 12th rib fxs ?Restrictions ?Weight Bearing Restrictions: No ? ? ?Therapy/Group: Individual Therapy ? ?Doreene Burke PT, DPT ? ?08/12/2021, 4:56 PM  ?

## 2021-08-12 NOTE — Progress Notes (Signed)
?                                                       PROGRESS NOTE ? ? ?Subjective/Complaints: ?No new complaints this morning ?His wife notes increased confusion ?Min A in orientation, problem solving, and awareness ? ?ROS: Denies CP, SOB, N/V/D, slept better at night, +urinary frequency, no longer with foul odor. ? ?Objective: ?  ?No results found. ?Recent Labs  ?  08/12/21 ?0629  ?WBC 6.1  ?HGB 9.2*  ?HCT 28.3*  ?PLT 258  ? ? ? ?Recent Labs  ?  08/10/21 ?1038 08/12/21 ?0629  ?NA 138 138  ?K 4.2 4.1  ?CL 109 109  ?CO2 22 20*  ?GLUCOSE 117* 103*  ?BUN 18 18  ?CREATININE 2.01* 1.97*  ?CALCIUM 8.6* 8.8*  ? ? ? ?Intake/Output Summary (Last 24 hours) at 08/12/2021 1108 ?Last data filed at 08/12/2021 0830 ?Gross per 24 hour  ?Intake 600 ml  ?Output --  ?Net 600 ml  ?  ? ?  ? ?Physical Exam: ?Vital Signs ?Blood pressure 136/62, pulse 68, temperature 97.9 ?F (36.6 ?C), temperature source Oral, resp. rate 14, height $RemoveBe'5\' 11"'GcdFCRtlW$  (1.803 m), weight 95.2 kg, SpO2 94 %. ?Gen: no distress, normal appearing ?HEENT: oral mucosa pink and moist, NCAT ?Cardio: Reg rate ?Chest: normal effort, normal rate of breathing ?Abd: soft, non-distended ?Ext: no edema ?Psych: pleasant, normal affect ?Skin: intact ?Musculoskeletal: No tenderness in extremities. ?Neuro: Alert ?Resting tremor of hands and legs, frequent tongue movements ?Motor: Antigravity strength throughout  ? ? ?Assessment/Plan: ?1. Functional deficits which require 3+ hours per day of interdisciplinary therapy in a comprehensive inpatient rehab setting. ?Physiatrist is providing close team supervision and 24 hour management of active medical problems listed below. ?Physiatrist and rehab team continue to assess barriers to discharge/monitor patient progress toward functional and medical goals ? ?Care Tool: ? ?Bathing ?   ?Body parts bathed by patient: Right arm, Left arm, Chest, Abdomen, Front perineal area, Buttocks, Right upper leg, Left upper leg, Face  ? Body parts bathed by  helper: Right lower leg, Left lower leg ?  ?  ?Bathing assist Assist Level: Minimal Assistance - Patient > 75% ?  ?  ?Upper Body Dressing/Undressing ?Upper body dressing   ?What is the patient wearing?: Pull over shirt ?   ?Upper body assist Assist Level: Maximal Assistance - Patient 25 - 49% ?   ?Lower Body Dressing/Undressing ?Lower body dressing ? ? ?   ?What is the patient wearing?: Pants, Underwear/pull up ? ?  ? ?Lower body assist Assist for lower body dressing: Maximal Assistance - Patient 25 - 49% ?   ? ?Toileting ?Toileting    ?Toileting assist Assist for toileting: Total Assistance - Patient < 25% ?  ?  ?Transfers ?Chair/bed transfer ? ?Transfers assist ?   ? ?Chair/bed transfer assist level: Minimal Assistance - Patient > 75% ?  ?  ?Locomotion ?Ambulation ? ? ?Ambulation assist ? ?   ? ?Assist level: Minimal Assistance - Patient > 75% ?Assistive device: Walker-rolling ?Max distance: 112ft  ? ?Walk 10 feet activity ? ? ?Assist ?   ? ?Assist level: Minimal Assistance - Patient > 75% ?Assistive device: Walker-rolling  ? ?Walk 50 feet activity ? ? ?Assist Walk 50 feet with 2 turns activity did not occur: Safety/medical concerns ? ?Assist  level: Minimal Assistance - Patient > 75% ?Assistive device: Walker-rolling  ? ? ?Walk 150 feet activity ? ? ?Assist Walk 150 feet activity did not occur: Safety/medical concerns ? ?Assist level: Minimal Assistance - Patient > 75% ?Assistive device: Walker-rolling ?  ? ?Walk 10 feet on uneven surface  ?activity ? ? ?Assist Walk 10 feet on uneven surfaces activity did not occur: Safety/medical concerns ? ? ?  ?   ? ?Wheelchair ? ? ? ? ?Assist Is the patient using a wheelchair?: Yes ?Type of Wheelchair: Manual ?  ? ?Wheelchair assist level: Total Assistance - Patient < 25% ?Max wheelchair distance: 150  ? ? ?Wheelchair 50 feet with 2 turns activity ? ? ? ?Assist ? ?  ?  ? ? ?Assist Level: Total Assistance - Patient < 25%  ? ?Wheelchair 150 feet activity  ? ? ? ?Assist ?    ? ? ?Assist Level: Total Assistance - Patient < 25%  ? ?Blood pressure 136/62, pulse 68, temperature 97.9 ?F (36.6 ?C), temperature source Oral, resp. rate 14, height $RemoveBe'5\' 11"'OVHVVZacW$  (1.803 m), weight 95.2 kg, SpO2 94 %. ? ?Medical Problem List and Plan: ?1. 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 Continue CIR ? HFU scheduled. ?-Interdisciplinary Team Conference today   ?2.  Impaired mobility:  ?-DVT/anticoagulation:  Mechanical: Sequential compression devices, below knee Bilateral lower extremities; Lovenox d/ced given brain hemorrhage.  ?            -antiplatelet therapy: aspirin ?3. Rib fracture pain: Tylenol, tramadol as needed. Start lidocaine patch. Discussed that this should be applied directly over area of pain. Provided with pain relief journal ?4. Depression: Increase trazodone to $RemoveBefo'75mg'TxnDyIshpkK$  HS LCSW asked to evaluate and provide emotional support ?            --continue carbamazepine  ?            -antipsychotic agents: Not applicable ?5. Neuropsych: This patient is not capable of making decisions on his own behalf. ?6. Skin/Wound Care: Routine skin care checks ?7. 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  ?Creatinine 1.97 on 2/22, 2.01 on 2/28, monitor weekly.  ?--Monitor urine output and chemistries ?16: Transaminitis: Monitor  chemistries ?17: Hypertension: Continue Norvasc and hydralazine. Continue heart healthy diet ?            -- Home Zestoretic held secondary to kidney function.  ?18: Hyperlipidemia: Continue fenofibrate ?19: Bilateral carotid artery stenosis: Asymptomatic ?            -- Status post bilateral carotid endarterectomies ?20: Anemia, chronic disease, hemodilution etiologies likely: Monitor CBC ?            -- Fecal occult blood negative 2/13 ?            -- Colonoscopy 2016, polyps.  Recall 5 years ?21. Prediabetes: check CBGs AC/HS.  ? Controlled on 2/25 ?22. Hypokalemia: supplement 79meq on 2/17.  ?  23. Suboptimal vitamin D level: continue 2000U daily.  ?24. Insomnia: Started trazodone $RemoveBeforeDEI'50mg'XkerGjlOonXnamsu$  HS, increased to 75 start sleep chart. ? Appears to be improving ?25. Worsening of cognitive status with no new focal weakness, recent labwork and vitals unremarkable , unremarkable CXR, per Dr Erlinda Hong repeat CT head twith small punctate hemorrhage, AMS likely multifactorial , hx mild dementia, on benzo, bilateral infarcts. UA negative- d/c keflex. Resolved.  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 Discussed diagnoses and treatment options with wife and therapy. Recommended outpatient [place].  ?53. Overweight BMI 29.27: provide dietary education ?28. Decreased initiation: start amantadine today, discussed that this could worsen insomnia. Discussed increased dose but wife feels we can maintain current dose for now ?29. Tremor: discussed risks and benefits of propanolol. Continue amantadine which is helping. Discussed with wife that slight worsening today could be due to UTI.  ?30. Urinary incontinence: placed order for bladder program. UA/UC ordered and UC with multiple species, repeat UC ordered. Start Keflex BID given symptoms.  ?31. Pruritis: Sarna ordered.  ? ?LOS: ?14 days ?A FACE TO FACE EVALUATION WAS PERFORMED ? ?William [name] ?08/12/2021, 11:08 AM  ? ?  ?

## 2021-08-12 NOTE — Progress Notes (Signed)
Physical Therapy Session Note ? ?Patient Details  ?Name: William [name] ?MRN: [id number] ?Date of Birth: 1941/04/11 ? ?Today's Date: 08/12/2021 ?PT Individual Time: 8563-1497 ?PT Individual Time Calculation (min): 70 min  ? ?Short Term Goals: ?Week 2:  PT Short Term Goal 1 (Week 2): Pt will complete supine<>sit w/ S using bed features ?PT Short Term Goal 2 (Week 2): Pt will complete sit<>stand minA from lowered bed ?PT Short Term Goal 3 (Week 2): Pt will ambulate 264ft w/ LRAD minA ?PT Short Term Goal 4 (Week 2): Pt will complete 4 steps minA w/ LRAD ? ?Skilled Therapeutic Interventions/Progress Updates:  ?  Pt received seated in w/c in room, agreeable to PT session. Pt reports some pain in his ribs on L side 2/2 rib fracture, utilizing lidocaine patch for pain management. Dependent transport via w/c to/from therapy gym for time and energy conservation. Sit to stand with CGA to RW, stand pivot transfer to [place] with RW and CGA. [place]ep level 3 x 7 minutes with use of B UE/LE for global endurance training and reciprocal movement pattern. Pt reports urge to urinate. Ambulation to bathroom with RW and min A for balance. Standing balance while toileting with RW and min A, cues for sequencing and assist needed for clothing management. Transition to sink to wash hands, pt requires cues to end task due to perseveration with washing hands then with drying hands. Returned to w/c. Pt exhibits R lateral lean in sitting, requires max multimodal cueing to correct and pt unable to maintain midline even with use of mirror. Stand pivot transfer w/c to mat table with no AD and min A. Placed patient in [place] for higher level balance training without use of AD. Ambulation 2 x 20 ft forwards, laterally, and backwards in maxisky harness with HHA, use of mirror for visual feedback for upright posture. Pt exhibits most difficulty performing backwards ambulation with increased R lateral lean in standing and some path deviation to the  R noted. Pt returned to room at end of session, left seated in w/c for dinner with needs in reach, quick release belt and chair alarm in place, wife present. ? ?Therapy Documentation ?Precautions:  ?Precautions ?Precautions: Fall ?Precaution Comments: L 11th and 12th rib fxs ?Restrictions ?Weight Bearing Restrictions: No ? ? ? ? ? ?Therapy/Group: Individual Therapy ? ? ?Excell Seltzer, PT, DPT, CSRS ? ?08/12/2021, 5:04 PM  ?

## 2021-08-12 NOTE — Progress Notes (Signed)
Patient ID: William [name], male   DOB: January 20, 1941, 81 y.o.   MRN: [id number]  Met with pt and wife to discuss team conference progress this week in therapies. Pt is making slow and steady gains with his endurance. Target discharge date still 3/7. Wife has been doing hands on education in pt's therapies this week to learn his care. Discussed equipment needs-wife to get rolling walker on her own due to not covered with them getting a wheelchair also. Wife reports her daughter may come for two weeks to assist her with pt's care. Will fax FMLA papers to this worker. Will continue to work on discharge needs. ?

## 2021-08-13 ENCOUNTER — Ambulatory Visit: Payer: PPO | Admitting: Physical Therapy

## 2021-08-13 LAB — GLUCOSE, CAPILLARY
Glucose-Capillary: 98 mg/dL (ref 70–99)
Glucose-Capillary: 98 mg/dL (ref 70–99)
Glucose-Capillary: 96 mg/dL (ref 70–99)
Glucose-Capillary: 97 mg/dL (ref 70–99)

## 2021-08-13 NOTE — Progress Notes (Signed)
?                                                       PROGRESS NOTE ? ? ?Subjective/Complaints: ?Wife is concerned about how to treat his UTI at home. Discussed that we have no positive confirmation of UTI but can complete course of Keflex this time due to symptoms. ? ?ROS: Denies CP, SOB, N/V/D, slept better at night, +urinary frequency, no longer with foul odor. ? ?Objective: ?  ?No results found. ?Recent Labs  ?  08/12/21 ?0629  ?WBC 6.1  ?HGB 9.2*  ?HCT 28.3*  ?PLT 258  ? ? ? ?Recent Labs  ?  08/12/21 ?0629  ?NA 138  ?K 4.1  ?CL 109  ?CO2 20*  ?GLUCOSE 103*  ?BUN 18  ?CREATININE 1.97*  ?CALCIUM 8.8*  ? ? ? ?Intake/Output Summary (Last 24 hours) at 08/13/2021 1725 ?Last data filed at 08/13/2021 1300 ?Gross per 24 hour  ?Intake 360 ml  ?Output --  ?Net 360 ml  ?  ? ?  ? ?Physical Exam: ?Vital Signs ?Blood pressure 139/65, pulse 70, temperature 98.1 ?F (36.7 ?C), resp. rate 18, height $RemoveBe'5\' 11"'HjTImSbmT$  (1.803 m), weight 91.4 kg, SpO2 98 %. ?Gen: no distress, normal appearing, BMI 28.10 ?HEENT: oral mucosa pink and moist, NCAT ?Cardio: Reg rate ?Chest: normal effort, normal rate of breathing ?Abd: soft, non-distended ?Ext: no edema ?Psych: pleasant, normal affect ?Skin: intact ?Musculoskeletal: No tenderness in extremities. ?Neuro: Alert ?Resting tremor of hands and legs, frequent tongue movements ?Motor: Antigravity strength throughout  ? ? ?Assessment/Plan: ?1. Functional deficits which require 3+ hours per day of interdisciplinary therapy in a comprehensive inpatient rehab setting. ?Physiatrist is providing close team supervision and 24 hour management of active medical problems listed below. ?Physiatrist and rehab team continue to assess barriers to discharge/monitor patient progress toward functional and medical goals ? ?Care Tool: ? ?Bathing ?   ?Body parts bathed by patient: Right arm, Left arm, Chest, Abdomen, Front perineal area, Buttocks, Right upper leg, Left upper leg, Face  ? Body parts bathed by helper: Right  lower leg, Left lower leg ?  ?  ?Bathing assist Assist Level: Minimal Assistance - Patient > 75% ?  ?  ?Upper Body Dressing/Undressing ?Upper body dressing   ?What is the patient wearing?: Pull over shirt ?   ?Upper body assist Assist Level: Maximal Assistance - Patient 25 - 49% ?   ?Lower Body Dressing/Undressing ?Lower body dressing ? ? ?   ?What is the patient wearing?: Pants, Underwear/pull up ? ?  ? ?Lower body assist Assist for lower body dressing: Maximal Assistance - Patient 25 - 49% ?   ? ?Toileting ?Toileting    ?Toileting assist Assist for toileting: Total Assistance - Patient < 25% ?  ?  ?Transfers ?Chair/bed transfer ? ?Transfers assist ?   ? ?Chair/bed transfer assist level: Minimal Assistance - Patient > 75% ?  ?  ?Locomotion ?Ambulation ? ? ?Ambulation assist ? ?   ? ?Assist level: Minimal Assistance - Patient > 75% ?Assistive device: Walker-rolling ?Max distance: 176ft  ? ?Walk 10 feet activity ? ? ?Assist ?   ? ?Assist level: Minimal Assistance - Patient > 75% ?Assistive device: Walker-rolling  ? ?Walk 50 feet activity ? ? ?Assist Walk 50 feet with 2 turns activity did not occur: Safety/medical concerns ? ?  Assist level: Minimal Assistance - Patient > 75% ?Assistive device: Walker-rolling  ? ? ?Walk 150 feet activity ? ? ?Assist Walk 150 feet activity did not occur: Safety/medical concerns ? ?Assist level: Minimal Assistance - Patient > 75% ?Assistive device: Walker-rolling ?  ? ?Walk 10 feet on uneven surface  ?activity ? ? ?Assist Walk 10 feet on uneven surfaces activity did not occur: Safety/medical concerns ? ? ?  ?   ? ?Wheelchair ? ? ? ? ?Assist Is the patient using a wheelchair?: Yes ?Type of Wheelchair: Manual ?  ? ?Wheelchair assist level: Total Assistance - Patient < 25% ?Max wheelchair distance: 150  ? ? ?Wheelchair 50 feet with 2 turns activity ? ? ? ?Assist ? ?  ?  ? ? ?Assist Level: Total Assistance - Patient < 25%  ? ?Wheelchair 150 feet activity  ? ? ? ?Assist ?   ? ? ?Assist Level:  Total Assistance - Patient < 25%  ? ?Blood pressure 139/65, pulse 70, temperature 98.1 ?F (36.7 ?C), resp. rate 18, height $RemoveBe'5\' 11"'VxkTauFCV$  (1.803 m), weight 91.4 kg, SpO2 98 %. ? ?Medical Problem List and Plan: ?1. 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 Continue CIR ? HFU scheduled. ?2.  Impaired mobility:  ?-DVT/anticoagulation:  Mechanical: Sequential compression devices, below knee Bilateral lower extremities; Lovenox d/ced given brain hemorrhage.  ?            -antiplatelet therapy: aspirin ?3. Rib fracture pain: Tylenol, tramadol as needed. Start lidocaine patch. Discussed that this should be applied directly over area of pain. Provided with pain relief journal ?4. Depression: Increase trazodone to $RemoveBefo'75mg'sRLxQTYtmVs$  HS LCSW asked to evaluate and provide emotional support ?            --continue carbamazepine  ?            -antipsychotic agents: Not applicable ?5. Neuropsych: This patient is not capable of making decisions on his own behalf. ?6. Skin/Wound Care: Routine skin care checks ?7. 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  ?Creatinine 1.97 on 2/22, 2.01 on 2/28, monitor weekly.  ?--Monitor urine output and chemistries ?16: Transaminitis: Monitor chemistries ?17: Hypertension: Continue Norvasc and hydralazine. Continue heart healthy diet ?             -- Home Zestoretic held secondary to kidney function.  ?18: Hyperlipidemia: Continue fenofibrate ?19: Bilateral carotid artery stenosis: Asymptomatic ?            -- Status post bilateral carotid endarterectomies ?20: Anemia, chronic disease, hemodilution etiologies likely: Monitor CBC ?            -- Fecal occult blood negative 2/13 ?            -- Colonoscopy 2016, polyps.  Recall 5 years ?21. Prediabetes: check CBGs AC/HS.  ? Controlled on 2/25 ?22. Hypokalemia: supplement 82meq on 2/17.  ?23. Suboptimal vitamin D level: continue 2000U daily.  ?  24. Insomnia: Started trazodone $RemoveBeforeDEI'50mg'HcJMWQxZonemOHBQ$  HS, increased to 75 start sleep chart. ? Appears to be improving. Placed nursing order for sign on the door to not wake patient between 9pm and 7am ?25. Worsening of cognitive status with no new focal weakness, now stable/improved, recent labwork and vitals unremarkable , unremarkable CXR, per Dr Erlinda Hong repeat CT head twith small punctate hemorrhage, AMS likely multifactorial , hx mild dementia, on benzo, bilateral infarcts.  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 Discussed diagnoses and treatment options with wife and therapy. Recommended outpatient [place].  ?39. Overweight BMI 29.2->28.10 provide dietary education ?28. Decreased initiation: start amantadine today, discussed that this could worsen insomnia. Discussed increased dose but wife feels we can maintain current dose for now ?29. Tremor: discussed risks and benefits of propanolol. Continue amantadine which is helping. Discussed with wife that slight worsening today could be due to UTI. Continue weighted utensils which are helping. ?30. Urinary incontinence: placed order for bladder program. UA/UC ordered and UC with multiple species, repeat UC with <10,000 insignifant growth Started Keflex BID given symptoms and wife would prefer course is completed this time. ?31. Pruritis: Sarna ordered.  ? ? ?LOS: ?15 days ?A FACE TO FACE EVALUATION WAS  PERFORMED ? ?Martha Clan P Lorie Cleckley ?08/13/2021, 5:25 PM  ? ?  ?

## 2021-08-13 NOTE — Progress Notes (Signed)
Patient ID: William [name], male   DOB: 10-29-1940, 81 y.o.   MRN: [id number]  Spoke with pt's daughter-Carrie who plans to take a few weeks off to come and assist her step-mom with Dad's care. She will email this worker her FMLA forms. Work on this and get back to her. ?

## 2021-08-13 NOTE — Progress Notes (Signed)
Physical Therapy Weekly Progress Note ? ?Patient Details  ?Name: William [name] ?MRN: [id number] ?Date of Birth: 1940/09/10 ? ?Beginning of progress report period: August 06, 2021 ?End of progress report period: August 13, 2021 ? ?Today's Date: 08/13/2021 ?PT Individual Time: 3559-7416 ?PT Individual Time Calculation (min): 75 min  ? ?Patient has met 2 of 4 short term goals. Pt is making slow but steady progress towards his goals this reporting period. His performance and ability fluctuates depending on fatigue, attention, and awareness. He typically requires modA for bed mobility (with hospital bed features), supervision for sitting balance, minA for sit<>stands to RW, minA for stand<>pivot transfers, and is ambulating ~143ft with minA using a RW. He's able to navigate up/down x12 steps using 1 rail on R with minA. He continues to be primarily limited by motor apraxia, L inattention, trunk frigidity, poor memory and recall, decreased safety awareness, and decreased dynamic standing balance.  ? ?Patient continues to demonstrate the following deficits muscle weakness and muscle joint tightness, decreased cardiorespiratoy endurance, impaired timing and sequencing, unbalanced muscle activation, motor apraxia, decreased coordination, and decreased motor planning, decreased attention to left, decreased motor planning, and ideational apraxia, decreased initiation, decreased attention, decreased awareness, decreased problem solving, decreased safety awareness, and decreased memory, and decreased sitting balance, decreased standing balance, hemiplegia, and decreased balance strategies and therefore will continue to benefit from skilled PT intervention to increase functional independence with mobility. ? ?Patient progressing toward long term goals..  Continue plan of care. ? ?PT Short Term Goals ?Week 3:  PT Short Term Goal 1 (Week 3): STG = LTG due to ELOS ? ?Skilled Therapeutic Interventions/Progress Updates:  ?   ?Pt seen  supine in bed with his wife at the bedside. Pt agreeable without reports of pain. Discussed general DC planning, pt progress with therapy, and answered questions regarding home safety. Wife asking about home entrance and back entrance to home, both have curb entrances with 4-6inch heights. Related concern regarding backdoor entrance due to no rails and higher step. ? ?Supine<>siting with modA with HOB nearly fully elevated and heavy reliance of bed rails for trunk support. Required mod to max instructional cues for sequencing and technique. He also required modA for forward scooting at EOB to reach feet flat to floor. Donned pants with modA for threading in sitting and then for pulling up in standing. Sit<>stand with minA with therapist stabilizing RW and minA for standing balance while pulling pants up. Stand<>Pivot transfer with minA and RW to w/c with cues for safety awareness and technique. Transported downstairs to 54M rehab gym for time and energy conservation. ? ?Instructed in functional gait training - ambulating 153ft with minA and RW - assist needed for guiding and occasional steering of RW. Primary gait deficits include short shuffling steps, forward flexed trunk, decreased proximity to RW, and difficulty managing RW especially during turns. Required seated rest break for fatigue. ? ?Pt then instructed in curb training to simulate home environment. Demonstration from PT provided to ensure understanding but after 1 minute rest, pt unable to recall instructions. Pt navigated both 5inch and 8inch curb with minA and RW but required max instructional cues for technique, cues especially needed for keeping body close to RW while navigating the curb. Pt also completed stair training, navigating up/down 12 steps with minA using 1 hand rail on R (pt's L inattention affecting ability to maintain L grasp to rail as was safer using only R rail). Pt with reciprocal stepping for ascent and step-to pattern  for descent.  MinA needed for support and stability while stepping with x1 LOB during the 9th step that required minA for correction.  ? ?Practiced blocked practice stand<>pivot transfers using RW, transferring from wheelchair to arm chair, completed x6 times total - progressing from minA to [place] but still required min to mod instructional cues for technique, arm placement, and managing RW. ? ?Pt then transported to ortho rehab gym. Completed car transfer with minA for assisting controlled lowering to car seat but he was able to manage LE's without assist. Verbal cueing needed for sequencing and instruction for repositioning in car due to sacral sitting.  ? ?Pt returned upstairs to his room. Remained seated in w/c with safety belt alarm on. All needs in reach, wife at bedside.  ? ?Therapy Documentation ?Precautions:  ?Precautions ?Precautions: Fall ?Precaution Comments: L 11th and 12th rib fxs ?Restrictions ?Weight Bearing Restrictions: No ?General: ?  ? ?Therapy/Group: Individual Therapy ? ?Jessly Lebeck P Palmira Stickle PT ?08/13/2021, 7:30 AM  ?

## 2021-08-13 NOTE — Progress Notes (Signed)
Speech Language Pathology Daily Session Note ? ?Patient Details  ?Name: CASANOVA SCHURMAN ?MRN: [id number] ?Date of Birth: 02/03/1941 ? ?Today's Date: 08/13/2021 ?SLP Individual Time: 7096-4383 ?SLP Individual Time Calculation (min): 42 min ? ?Short Term Goals: ?Week 3: SLP Short Term Goal 1 (Week 3): STG=LTG due to ELOS 3/7 ? ?Skilled Therapeutic Interventions: Skilled ST treatment focused on cognitive-communication goals. SLP facilitated session by providing overall min A in regards to immediate recall of pertinent information during functional voicemail memory task. Patient recalled similar information given brief delay (5 minutes) with moderate A verbal cues including field of choices. Pt's spouse inquired about allowing patient to consume liquids at bedside without direct supervision in effort to increase hydration. SLP assessed tolerance of thin liquids in which patient consumed by straw without overt s/sx of aspiration. SLP reinforced safe swallowing precautions and strategies with particular emphasis on upright positioning in bed or chair during PO consumption. Patient cleared to consume liquids at bedside with adequate positioning. Informed spouse and NT. Patient was left in bed with alarm activated and immediate needs within reach at end of session. Continue per current plan of care.   ?   ?Pain ?Pain Assessment ?Pain Scale: 0-10 ?Pain Score: 0-No pain ? ?Therapy/Group: Individual Therapy ? ?Laramie Meissner T Maigen Mozingo ?08/13/2021, 2:12 PM ?

## 2021-08-14 LAB — BASIC METABOLIC PANEL
Anion gap: 8 (ref 5–15)
BUN: 17 mg/dL (ref 8–23)
CO2: 22 mmol/L (ref 22–32)
Calcium: 9.3 mg/dL (ref 8.9–10.3)
Chloride: 105 mmol/L (ref 98–111)
Creatinine, Ser: 1.86 mg/dL — ABNORMAL HIGH (ref 0.61–1.24)
GFR, Estimated: 36 mL/min — ABNORMAL LOW (ref >60)
Glucose, Bld: 106 mg/dL — ABNORMAL HIGH (ref 70–99)
Potassium: 4.4 mmol/L (ref 3.5–5.1)
Sodium: 135 mmol/L (ref 135–145)

## 2021-08-14 LAB — GLUCOSE, CAPILLARY
Glucose-Capillary: 100 mg/dL — ABNORMAL HIGH (ref 70–99)
Glucose-Capillary: 96 mg/dL (ref 70–99)
Glucose-Capillary: 115 mg/dL — ABNORMAL HIGH (ref 70–99)
Glucose-Capillary: 95 mg/dL (ref 70–99)

## 2021-08-14 NOTE — Progress Notes (Signed)
Occupational Therapy Weekly Progress Note ? ?Patient Details  ?Name: William [name] ?MRN: [id number] ?Date of Birth: July 07, 1940 ? ?Beginning of progress report period: August 07, 2021 ?End of progress report period: August 14, 2021 ? ?Today's Date: 08/14/2021 ?OT Individual Time: 3893-7342 ?OT Individual Time Calculation (min): 71 min  ? ? ?Patient has met 3 of 4 short term goals.  Patient making steady progress toward goals demonstrating improved ability to follow 1-2 step verbal commands, improved motor planning and increased static/dynamic standing balance. Patient current requires supervision A for UB bathing, Mod A UB dressing, and Mod A for LB bathing/dressing with Mod-Max verbal cues overall. Patient able to complete sit to stand transfers with CGA and cues for hand placement.  ? ?Patient continues to demonstrate the following deficits:  muscle weakness, decreased cardiorespiratoy endurance, impaired timing and sequencing, abnormal tone, unbalanced muscle activation, motor apraxia, decreased coordination, and decreased motor planning, decreased initiation, decreased attention, decreased awareness, decreased problem solving, decreased safety awareness, decreased memory, and delayed processing, and decreased standing balance, decreased postural control, hemiplegia, and decreased balance strategies and therefore will continue to benefit from skilled OT intervention to enhance overall performance with BADL and Reduce care partner burden. ? ?Patient progressing toward long term goals..  Continue plan of care. ? ?OT Short Term Goals ?Week 1:  OT Short Term Goal 1 (Week 1): Pt will performed 2/3 toileting tasks with contact guard ?OT Short Term Goal 1 - Progress (Week 1): Progressing toward goal ?OT Short Term Goal 2 (Week 1): Pt will don shirt with mod A ?OT Short Term Goal 2 - Progress (Week 1): Met ?OT Short Term Goal 3 (Week 1): Pt will perform toilet transfer (on/off) with contact guard ?OT Short Term Goal 3 -  Progress (Week 1): Progressing toward goal ?OT Short Term Goal 4 (Week 1): Pt will use bilateral hands in all functional tasks with mod VC ?OT Short Term Goal 4 - Progress (Week 1): Met ?OT Short Term Goal 5 (Week 1): Pt will stand at the sink to perform 2 functional tasks with contact guard for balance ?OT Short Term Goal 5 - Progress (Week 1): Met ?Week 2:  OT Short Term Goal 1 (Week 2): Pt will donn shirt with min assist ?OT Short Term Goal 1 - Progress (Week 2): Met ?OT Short Term Goal 2 (Week 2): Pt will complete LB dressing with mod assist. ?OT Short Term Goal 2 - Progress (Week 2): Met ?OT Short Term Goal 3 (Week 2): Pt will complete sit<>stand with CGA using RW ?OT Short Term Goal 3 - Progress (Week 2): Met ?OT Short Term Goal 4 (Week 2): Pt will complete 3/3 toileting with min assist. ?OT Short Term Goal 4 - Progress (Week 2): Progressing toward goal ?Week 3:  OT Short Term Goal 1 (Week 3): STG=LTG 2/2 ELOS ? ?Skilled Therapeutic Interventions/Progress Updates:  ?Patient met lying supine in bed in agreement with OT treatment session. 0/10 pain reported at rest and with activity. Supine to EOB with Mod A and mutlimodal cues for sequencing/safety. Sit to stand from EOB with CGA for safety. CGA with HHA for toilet transfer to [facility] over standard height commode. Patient reported need for BM but able to move bowels after 20 minutes. CGA and HHA for mobility to sink for grooming and UB/LB bathing dressing. Supervision A for UB bathing, Mod A for UB dressing, and Mod A for LB bathing/dressing with Mod cues for sequencing, attention, initiation and termination. Patient with decreased attention  to L side of body requiring cues for hemi technique with UB/LB dressing. 3/3 grooming tasks completed in sitting/standing at sink level with close supervision to [place]. Returned to bed at conclusion of session per patient request with patient requiring assist to advance BLE from EOB to bed surface and assist to get to middle of  bed. Breakfast tray arrived at conclusion of session. Wife present to provide supervision A for meal. Bed alarm activated and all needs met.  ? ?Therapy Documentation ?Precautions:  ?Precautions ?Precautions: Fall ?Precaution Comments: L 11th and 12th rib fxs ?Restrictions ?Weight Bearing Restrictions: No ?General: ?  ?Therapy/Group: Individual Therapy ? ?Nevelyn Mellott R [place]-Davis ?08/14/2021, 6:57 AM  ?

## 2021-08-14 NOTE — Progress Notes (Signed)
Physical Therapy Session Note ? ?Patient Details  ?Name: William [name] ?MRN: [id number] ?Date of Birth: 22-Feb-1941 ? ?Today's Date: 08/14/2021 ?PT Individual Time: 1000-1100 ?PT Individual Time Calculation (min): 60 min  ? 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 ?Skilled Therapeutic Interventions/Progress Updates:  ?   ?Pt presenting supine in bed asleep, agreeable to PT tx once woken. Wife at bedside. Pt denies pain. Focused session on family training and education to prepare for upcoming DC.  ? ?Pt completed supine<>sit with minA with hospital bed features. Requires instructional cueing for technique and sequencing. Limited by trunk rigidity, delayed initiation, poor motor planning, and L inattention.  ? ?Requires modA for forward scooting to EOB. Donned tennis shoes with maxA for time. Sit<>stand to RW with minA from lowered EOB height and completed stand<>pivot transfer to w/c with CGA and RW. ? ?Transported downstairs to 82M rehab gym in w/c for time.  ? ?Completed car transfer with car height simulating their mid-size SUV. Wife present for active observation. Pt requiring minA for car transfer but mod/max instructional cues for technique/sequencing. Pt with difficulty scooting hips posteriorly in the car for repositioning. Recommended to wife a portable stability bar that can be used in car latch to assist with car transfers.  ? ?Pt ambulated ~48f + ~5107fwith wife providing CGA/minA and PT providing close supervision, pt using RW. Pt required cueing for RW management, guiding/steering RW, and keeping body within walker frame.  ? ?Completed furniture transfers from low sitting recliner (pt sits in recliner at home frequently). Pt required CGA/minA for standing and sitting into the recliner. He prefers to grasp RW with both hands and wife/PT stabilizes RW as he assists in pulling to stand. (Have tried throughout his stay to encourage him to push from armrests/sitting  surface but no carryover). ? ?Pt then completed curb transfers on both 5inch and 8inch step. Required RW and minA with max instructional cues for safety and technique. Recommended to wife that they avoid going out on back patio (has 8.5inch step) [facility] and [facility] balance improves.  ? ?Pt transported back upstairs to his room in his w/c. Remained seated in w/c with all needs met, safety belt alarm on.  ? ?Therapy Documentation ?Precautions:  ?Precautions ?Precautions: Fall ?Precaution Comments: L 11th and 12th rib fxs ?Restrictions ?Weight Bearing Restrictions: No ?General: ?  ? ?Therapy/Group: Individual Therapy ? ?Staria Birkhead P Jovanka [place]gate ?08/14/2021, 7:38 AM  ?

## 2021-08-14 NOTE — Progress Notes (Addendum)
Occupational Therapy Session Note ? ?Patient Details  ?Name: William [name] ?MRN: [id number] ?Date of Birth: 08-02-1940 ? ? ?Short Term Goals: ?Week 2:  OT Short Term Goal 1 (Week 2): Pt will donn shirt with min assist ?OT Short Term Goal 2 (Week 2): Pt will complete LB dressing with mod assist. ?OT Short Term Goal 3 (Week 2): Pt will complete sit<>stand with CGA using RW ?OT Short Term Goal 4 (Week 2): Pt will complete 3/3 toileting with min assist. ? ?Skilled Therapeutic Interventions/Progress Updates:  ?  Pt sitting up in w/c, wife present, no c/o pain.  Pts wife had questions regarding how to train pts son and dtr in order to maximize pts support system at discharge to home setting.  Therapist provided psychosocial support to wife during conversation with wife expressing feeling overwhelmed and nervous about transition.  Pt and wife report feeling more confident and less worried after discussion with therapist.  Pt agreeable to going to dayroom and transported via w/c for energy conservation.  Pt participated in standing dynamic balance task at [place] with BUE functional reach/grasp/throw component.  Pt fluidly tossing bean bags with RUE however demonstrating difficulty opening digits at appropriate time to toss bags with left hand.  Utilized compensatory technique of counting with movement to 3 then cueing to "open" hand.  Pt showed great improvement with motor planning and output with this technique.  Pt then completed BUE strengthening using 4 lb dowel to complete chest press seated, shoulder flexion, and biceps curls.  Pt required frequent multimodal cues to promote fluid movement and slow pace of movement due to tendency towards short quick movements of poor quality.  Returned to room and pt requesting back to bed.  Stand pivot without AD with increased time, repetitive simple cues, and CGA. Sit to supine with min assist. Call bell in reach, bed alarm on. ? ?Therapy Documentation ?Precautions:   ?Precautions ?Precautions: Fall ?Precaution Comments: L 11th and 12th rib fxs ?Restrictions ?Weight Bearing Restrictions: No ? ? ? ?Therapy/Group: Individual Therapy ? ?Caryl Asp Evrett Hakim ?08/14/2021, 10:39 AM ?

## 2021-08-14 NOTE — Progress Notes (Signed)
?                                                       PROGRESS NOTE ? ? ?Subjective/Complaints: ?No new complaints today ?Says he slept well last night ?Urinary frequency stable/improved ?Tremor stable/improved ? ?ROS: Denies CP, SOB, N/V/D, slept better at night, +urinary frequency, no longer with foul odor. +tremor- improved ? ?Objective: ?  ?No results found. ?Recent Labs  ?  08/12/21 ?0629  ?WBC 6.1  ?HGB 9.2*  ?HCT 28.3*  ?PLT 258  ? ? ? ?Recent Labs  ?  08/12/21 ?0629  ?NA 138  ?K 4.1  ?CL 109  ?CO2 20*  ?GLUCOSE 103*  ?BUN 18  ?CREATININE 1.97*  ?CALCIUM 8.8*  ? ? ? ?Intake/Output Summary (Last 24 hours) at 08/14/2021 1038 ?Last data filed at 08/14/2021 0133 ?Gross per 24 hour  ?Intake 260 ml  ?Output 100 ml  ?Net 160 ml  ?  ? ?  ? ?Physical Exam: ?Vital Signs ?Blood pressure (!) 151/68, pulse 65, temperature 97.7 ?F (36.5 ?C), resp. rate 18, height 5' 11" (1.803 m), weight 91.4 kg, SpO2 100 %. ?Gen: no distress, normal appearing, BMI 28.10 ?HEENT: oral mucosa pink and moist, NCAT ?Cardio: Reg rate ?Chest: normal effort, normal rate of breathing ?Abd: soft, non-distended ?Ext: no edema ?Psych: pleasant, normal affect ?Skin: intact ?Musculoskeletal: No tenderness in extremities. ?Neuro: Alert. Memory impairment ?Resting tremor of hands and legs, frequent tongue movements ?Motor: Antigravity strength throughout  ? ? ?Assessment/Plan: ?1. Functional deficits which require 3+ hours per day of interdisciplinary therapy in a comprehensive inpatient rehab setting. ?Physiatrist is providing close team supervision and 24 hour management of active medical problems listed below. ?Physiatrist and rehab team continue to assess barriers to discharge/monitor patient progress toward functional and medical goals ? ?Care Tool: ? ?Bathing ?   ?Body parts bathed by patient: Right arm, Left arm, Chest, Abdomen, Front perineal area, Buttocks, Right upper leg, Left upper leg, Face  ? Body parts bathed by helper: Right lower leg,  Left lower leg ?  ?  ?Bathing assist Assist Level: Minimal Assistance - Patient > 75% ?  ?  ?Upper Body Dressing/Undressing ?Upper body dressing   ?What is the patient wearing?: Pull over shirt ?   ?Upper body assist Assist Level: Moderate Assistance - Patient 50 - 74% ?   ?Lower Body Dressing/Undressing ?Lower body dressing ? ? ?   ?What is the patient wearing?: Pants, Underwear/pull up ? ?  ? ?Lower body assist Assist for lower body dressing: Moderate Assistance - Patient 50 - 74% ?   ? ?Toileting ?Toileting    ?Toileting assist Assist for toileting: Minimal Assistance - Patient > 75% ?  ?  ?Transfers ?Chair/bed transfer ? ?Transfers assist ?   ? ?Chair/bed transfer assist level: Minimal Assistance - Patient > 75% ?  ?  ?Locomotion ?Ambulation ? ? ?Ambulation assist ? ?   ? ?Assist level: Minimal Assistance - Patient > 75% ?Assistive device: Walker-rolling ?Max distance: 185ft  ? ?Walk 10 feet activity ? ? ?Assist ?   ? ?Assist level: Minimal Assistance - Patient > 75% ?Assistive device: Walker-rolling  ? ?Walk 50 feet activity ? ? ?Assist Walk 50 feet with 2 turns activity did not occur: Safety/medical concerns ? ?Assist level: Minimal Assistance - Patient > 75% ?Assistive   device: Walker-rolling  ? ? ?Walk 150 feet activity ? ? ?Assist Walk 150 feet activity did not occur: Safety/medical concerns ? ?Assist level: Minimal Assistance - Patient > 75% ?Assistive device: Walker-rolling ?  ? ?Walk 10 feet on uneven surface  ?activity ? ? ?Assist Walk 10 feet on uneven surfaces activity did not occur: Safety/medical concerns ? ? ?  ?   ? ?Wheelchair ? ? ? ? ?Assist Is the patient using a wheelchair?: Yes ?Type of Wheelchair: Manual ?  ? ?Wheelchair assist level: Total Assistance - Patient < 25% ?Max wheelchair distance: 150  ? ? ?Wheelchair 50 feet with 2 turns activity ? ? ? ?Assist ? ?  ?  ? ? ?Assist Level: Total Assistance - Patient < 25%  ? ?Wheelchair 150 feet activity  ? ? ? ?Assist ?   ? ? ?Assist Level: Total  Assistance - Patient < 25%  ? ?Blood pressure (!) 151/68, pulse 65, temperature 97.7 ?F (36.5 ?C), resp. rate 18, height 5' 11" (1.803 m), weight 91.4 kg, SpO2 100 %. ? ?Medical Problem List and Plan: ?1. 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 Continue CIR ? HFU scheduled. ?2.  Impaired mobility:  ?-DVT/anticoagulation:  Mechanical: Sequential compression devices, below knee Bilateral lower extremities; Lovenox d/ced given brain hemorrhage.  ?            -antiplatelet therapy: aspirin ?3. Rib fracture pain: Tylenol, tramadol as needed. Start lidocaine patch. Discussed that this should be applied directly over area of pain. Provided with pain relief journal ?4. Depression: Increase trazodone to 57m HS LCSW asked to evaluate and provide emotional support ?            --continue carbamazepine  ?            -antipsychotic agents: Not applicable ?5. Neuropsych: This patient is not capable of making decisions on his own behalf. ?6. Skin/Wound Care: Routine skin care checks ?7. 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  ?Creatinine 1.97 on 2/22, 2.01 on 2/28, repeat today.  ?--Monitor urine output and chemistries ?16: Transaminitis: Monitor chemistries ?17: Hypertension: Continue Norvasc and hydralazine. Continue heart healthy diet ?             -- Home Zestoretic held secondary to kidney function.  ?18: Hyperlipidemia: Continue fenofibrate ?19: Bilateral carotid artery stenosis: Asymptomatic ?            -- Status post bilateral carotid endarterectomies ?20: Anemia, chronic disease, hemodilution etiologies likely: Monitor CBC ?            -- Fecal occult blood negative 2/13 ?            -- Colonoscopy 2016, polyps.  Recall 5 years ?21. Prediabetes: check CBGs AC/HS.  ? Controlled on 2/25 ?22. Hypokalemia: supplement 268m daily. Repeat potassium today.  ?23. Suboptimal vitamin D level: continue 2000U daily.  ?24. Insomnia: Started trazodone 5024m  HS, increased to 75 start sleep chart. ? Appears to be improving. Placed nursing order for sign on the door to not wake patient between 9pm and 7am ?25. Worsening of cognitive status with no new focal weakness, now stable/improved, recent labwork and vitals unremarkable , unremarkable CXR, per Dr Erlinda Hong repeat CT head twith small punctate hemorrhage, AMS likely multifactorial , hx mild dementia, on benzo, bilateral infarcts.  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 Discussed diagnoses and treatment options with wife and therapy. Recommended outpatient [place].  ?63. Overweight BMI 29.2->28.10 provide dietary education ?28. Decreased initiation: start amantadine today, discussed that this could worsen insomnia. Discussed increased dose but wife feels we can maintain current dose for now ?29. Tremor: discussed risks and benefits of propanolol.Continue amantadine which is helping. Discussed with wife that slight worsening today could be due to UTI. Continue weighted utensils which are helping. ?30. Urinary incontinence: placed order for bladder program. UA/UC ordered and UC with multiple species, repeat UC with <10,000 insignifant growth Started Keflex BID given symptoms and wife would prefer course is completed this time. ?31. Pruritis: Sarna ordered.  ? ? ?LOS: ?16 days ?A FACE TO FACE  EVALUATION WAS PERFORMED ? ?Martha Clan P Shaylan Tutton ?08/14/2021, 10:38 AM  ? ?  ?

## 2021-08-14 NOTE — Progress Notes (Signed)
Occupational Therapy Session Note ? ?Patient Details  ?Name: William [name] ?MRN: [id number] ?Date of Birth: Sep 02, 1940 ? ?Today's Date: 08/14/2021 ?OT Individual Time: 1304-1400 ?OT Individual Time Calculation (min): 56 min  ? ? ?Short Term Goals: ?Week 1:  OT Short Term Goal 1 (Week 1): Pt will performed 2/3 toileting tasks with contact guard ?OT Short Term Goal 1 - Progress (Week 1): Progressing toward goal ?OT Short Term Goal 2 (Week 1): Pt will don shirt with mod A ?OT Short Term Goal 2 - Progress (Week 1): Met ?OT Short Term Goal 3 (Week 1): Pt will perform toilet transfer (on/off) with contact guard ?OT Short Term Goal 3 - Progress (Week 1): Progressing toward goal ?OT Short Term Goal 4 (Week 1): Pt will use bilateral hands in all functional tasks with mod VC ?OT Short Term Goal 4 - Progress (Week 1): Met ?OT Short Term Goal 5 (Week 1): Pt will stand at the sink to perform 2 functional tasks with contact guard for balance ?OT Short Term Goal 5 - Progress (Week 1): Met ?Week 2:  OT Short Term Goal 1 (Week 2): Pt will donn shirt with min assist ?OT Short Term Goal 2 (Week 2): Pt will complete LB dressing with mod assist. ?OT Short Term Goal 3 (Week 2): Pt will complete sit<>stand with CGA using RW ?OT Short Term Goal 4 (Week 2): Pt will complete 3/3 toileting with min assist. ? ? ?Patient met seated in wc in agreement with OT treatment session. 0/10 pain reported at rest and with activity. Wife present at bedside at start of session. Patient incontinent of bladder so toileting offered. CGA and HHA for transfer to [facility] over standard height toilet in bathroom. Min A for clothing management and Mod 1-step verbal commands necessary for completion of transfer. Brief lightly soiled of urine but patient again unable to complete BM. RN made aware. CGA for hand hygiene standing at sink level with 1-step multimodal cues to complete task. Functional mobility to and from windows near 4N tower in prep for therapeutic activity.  In sitting patient completed 2 sets x10 reps each of LAQs, alternating marches, and wc push-ups. Cues for pacing/form. Blocked practice for sit to stand transfers x5 with CGA and cues for hand placement. Patient reporting fatigue from activities of the day. Upon return to room patient able to return to supine with Min A at BLE and for righting trunk. Session concluded with patient lying supine in bed with call bell within reach, bed alarm activated and all needs met.  ? ?Therapy Documentation ?Precautions:  ?Precautions ?Precautions: Fall ?Precaution Comments: L 11th and 12th rib fxs ?Restrictions ?Weight Bearing Restrictions: No ?General: ?  ? ?Therapy/Group: Individual Therapy ? ?Nikita Surman R [place]-Davis ?08/14/2021, 6:59 AM ?

## 2021-08-14 NOTE — Progress Notes (Signed)
Occupational Therapy Session Note ? ?Patient Details  ?Name: William [name] ?MRN: [id number] ?Date of Birth: May 01, 1941 ? ?Today's Date: 08/15/2021 ?OT Individual Time: [phone] ?OT Individual Time Calculation (min): 61 min  ? ? ?Skilled Therapeutic Interventions/Progress Updates:  ?  Pt greeted in bed with nursing present, breakfast tray had just arrived. Pt agreeable to eat EOB today to work on his sitting balance/core strength. It was very difficult for him to motor plan supine<sit, needed max step by step cues and ultimately Mod A due to time that it was taking. While EOB, pt could maintain close supervision for sitting balance for short windows of time given verbal and manual cues, however as time progressed pt required Mod-Max A due to Rt posterior lean. OT moved from sitting beside him to sitting behind more towards Rt side to provide better support. Pt used his weighted utensils to eat today due to tremors, verbal and tactile cues for taking small bites and ensuring slow pace. He was able to set himself up with oatmeal given verbal cues (pt attempting to put creamer into oatmeal). Note that he had 3-4 small spells of coughing. Per wife [name] who was present throughout and assisting OT with safety cuing, pt normally has an easier time eating. Informed RN. We limited degrees of freedom to increase ease of eating, OT holding bowl near pt's chest at times while he brought spoon to mouth. Oral cavity checked after eating and observed to be clear. Pt unable to scoot up towards [facility] due to motor planning deficits. CGA of 2 for sit<stand and side-stepping up towards [place]. 2 assist to return to bed safely. HOB raised for safety after eating his meal. Pt remained in bed, all needs within reach and bed alarm set. Tx focus placed on praxis, UE coordination, balance/trunk control, and caregiver education.    ? ?Therapy Documentation ?Precautions:  ?Precautions ?Precautions: Fall ?Precaution Comments: L 11th and 12th rib  fxs ?Restrictions ?Weight Bearing Restrictions: No ? ?Pain: pt denied pain during tx ?  ?ADL: ?ADL ?Eating: Supervision/safety ?Grooming: Minimal assistance ?Upper Body Bathing: Minimal assistance ?Where Assessed-Upper Body Bathing: Shower ?Lower Body Bathing: Minimal assistance ?Where Assessed-Lower Body Bathing: Shower ?Upper Body Dressing: Maximal assistance ?Where Assessed-Upper Body Dressing: Wheelchair ?Lower Body Dressing: Maximal assistance ?Where Assessed-Lower Body Dressing: Wheelchair ?Toileting: Maximal assistance ?Where Assessed-Toileting: Toilet, Bedside Commode ?Toilet Transfer: Minimal assistance ?Walk-In Shower Transfer: Moderate assistance ?Walk-In Shower Transfer Method: Ambulating ?Walk-In Shower Equipment: Radio broadcast assistant ? ?Therapy/Group: Individual Therapy ? ?Kebra Lowrimore A Armani Gawlik ?08/15/2021, 4:09 PM ?

## 2021-08-15 DIAGNOSIS — G4701 Insomnia due to medical condition: Secondary | ICD-10-CM

## 2021-08-15 LAB — GLUCOSE, CAPILLARY
Glucose-Capillary: 114 mg/dL — ABNORMAL HIGH (ref 70–99)
Glucose-Capillary: 117 mg/dL — ABNORMAL HIGH (ref 70–99)
Glucose-Capillary: 114 mg/dL — ABNORMAL HIGH (ref 70–99)
Glucose-Capillary: 115 mg/dL — ABNORMAL HIGH (ref 70–99)

## 2021-08-15 NOTE — Progress Notes (Signed)
PROGRESS NOTE   Subjective/Complaints: Complains of pain along left flank. No other issues reported this morning.   ROS: Patient denies fever, rash, sore throat, blurred vision, dizziness, nausea, vomiting, diarrhea, cough, shortness of breath or chest pain, joint or back/neck pain, headache, or mood change.   Objective:   No results found. No results for input(s): WBC, HGB, HCT, PLT in the last 72 hours.    Recent Labs    08/14/21 1210  NA 135  K 4.4  CL 105  CO2 22  GLUCOSE 106*  BUN 17  CREATININE 1.86*  CALCIUM 9.3     Intake/Output Summary (Last 24 hours) at 08/15/2021 1234 Last data filed at 08/15/2021 0934 Gross per 24 hour  Intake 356 ml  Output --  Net 356 ml        Physical Exam: Vital Signs Blood pressure 140/78, pulse 67, temperature 97.8 F (36.6 C), temperature source Oral, resp. rate 18, height _0  (1.803 m), weight 91.4 kg, SpO2 97 %. Constitutional: No distress . Vital signs reviewed. HEENT: NCAT, EOMI, oral membranes moist Neck: supple Cardiovascular: RRR without murmur. No JVD    Respiratory/Chest: CTA Bilaterally without wheezes or rales. Normal effort    GI/Abdomen: BS +, non-tender, non-distended Ext: no clubbing, cyanosis, or edema Psych: pleasant and cooperative  Skin: intact Musculoskeletal: No tenderness in extremities. Neuro: Alert. STM deficits Resting tremor of hands and legs, frequent tongue movements Motor: moves all 4's, 3-4/5   Assessment/Plan: 1. Functional deficits which require 3+ hours per day of interdisciplinary therapy in a comprehensive inpatient rehab setting. Physiatrist is providing close team supervision and 24 hour management of active medical problems listed below. Physiatrist and rehab team continue to assess barriers to discharge/monitor patient progress toward functional and medical goals  Care Tool:  Bathing    Body parts bathed by patient:  Right arm, Left arm, Chest, Abdomen, Front perineal area, Buttocks, Right upper leg, Left upper leg, Face   Body parts bathed by helper: Right lower leg, Left lower leg     Bathing assist Assist Level: Minimal Assistance - Patient > 75%     Upper Body Dressing/Undressing Upper body dressing   What is the patient wearing?: Pull over shirt    Upper body assist Assist Level: Moderate Assistance - Patient 50 - 74%    Lower Body Dressing/Undressing Lower body dressing      What is the patient wearing?: Pants, Underwear/pull up     Lower body assist Assist for lower body dressing: Moderate Assistance - Patient 50 - 74%     Toileting Toileting    Toileting assist Assist for toileting: Minimal Assistance - Patient > 75%     Transfers Chair/bed transfer  Transfers assist     Chair/bed transfer assist level: Minimal Assistance - Patient > 75%     Locomotion Ambulation   Ambulation assist      Assist level: Contact Guard/Touching assist Assistive device: Walker-rolling Max distance: 150   Walk 10 feet activity   Assist     Assist level: Contact Guard/Touching assist Assistive device: Walker-rolling   Walk 50 feet activity   Assist Walk 50 feet with 2 turns activity  did not occur: Safety/medical concerns  Assist level: Minimal Assistance - Patient > 75% Assistive device: Walker-rolling    Walk 150 feet activity   Assist Walk 150 feet activity did not occur: Safety/medical concerns  Assist level: Contact Guard/Touching assist Assistive device: Walker-rolling    Walk 10 feet on uneven surface  activity   Assist Walk 10 feet on uneven surfaces activity did not occur: Safety/medical concerns         Wheelchair     Assist Is the patient using a wheelchair?: Yes Type of Wheelchair: Manual    Wheelchair assist level: Total Assistance - Patient < 25% Max wheelchair distance: 150    Wheelchair 50 feet with 2 turns  activity    Assist        Assist Level: Total Assistance - Patient < 25%   Wheelchair 150 feet activity     Assist      Assist Level: Total Assistance - Patient < 25%   Blood pressure 140/78, pulse 67, temperature 97.8 F (36.6 C), temperature source Oral, resp. rate 18, height _0  (1.803 m), weight 91.4 kg, SpO2 97 %.  Medical Problem List and Plan: 1. 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 2.  Impaired mobility:  -DVT/anticoagulation:  Mechanical: Sequential compression devices, below knee Bilateral lower extremities; Lovenox d/ced given brain hemorrhage.              -antiplatelet therapy: aspirin 3. Rib fracture pain: Tylenol, tramadol as needed. Start lidocaine patch. Discussed that this should be applied directly over area of pain. Provided with pain relief journal 4. Depression: Increase trazodone to 70m HS LCSW asked to evaluate and provide emotional support             --continue carbamazepine              -antipsychotic agents: Not applicable 5. Neuropsych: This patient is not capable of making decisions on his own behalf. 6. Skin/Wound Care: Routine skin care checks 7. 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  Creatinine  1.97 on 2/22, 2.01 on 2/28, 1.86 3/3  --Monitor urine output and chemistries 16: Transaminitis: Monitor chemistries 17: Hypertension: Continue Norvasc and hydralazine. Continue heart healthy diet             -- Home Zestoretic held secondary to kidney function.  18: Hyperlipidemia: Continue fenofibrate 19: Bilateral carotid artery stenosis: Asymptomatic             -- Status post bilateral carotid endarterectomies 20: Anemia, chronic disease, hemodilution etiologies likely: Monitor CBC             -- Fecal occult blood negative 2/13             -- Colonoscopy 2016, polyps.  Recall 5 years 21. Prediabetes: check CBGs AC/HS.   Controlled on 2/25 22. Hypokalemia:  supplement 46mq daily. Repeat potassium today.  23. Suboptimal vitamin D level: continue 2000U daily.  24. Insomnia: Started trazodone 592mHS, increased to 75 start sleep chart.  Appears to be improving. Placed nursing order for sign on the door to not wake patient between 9pm and 7am 25. Worsening of cognitive status with no new focal weakness, now stable/improved, recent labwork and vitals unremarkable , unremarkable CXR, per Dr XuErlinda Hongepeat CT head twith small punctate hemorrhage, AMS likely multifactorial , hx mild dementia, on benzo, bilateral infarcts.   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 Discussed diagnoses and treatment options with wife and therapy. Recommended outpatient [place].  27. Overweight BMI 29.2->28.10 provide dietary education 28. Decreased initiation: start amantadine today, discussed that this could worsen insomnia. Discussed increased dose but wife feels we can maintain current dose for now 29. Tremor: discussed risks and benefits of propanolol.Continue amantadine which is helping. Discussed with wife that slight worsening today could be due to UTI. Continue weighted utensils which are helping. 30. Urinary incontinence: placed  order for bladder program. UA/UC ordered and UC with multiple species, repeat UC with <10,000 insignifant growth    -3/4 continue Empiric Keflex BID given symptoms--continued per wife's preference despite ucx results 31. Pruritis: Sarna ordered.    [place]: [place]/09/2021, 12:34 PM

## 2021-08-15 NOTE — Progress Notes (Signed)
Speech Language Pathology Daily Session Note ? ?Patient Details  ?Name: William [name] ?MRN: [id number] ?Date of Birth: Jan 25, 1941 ? ?Today's Date: 08/15/2021 ?SLP Individual Time: 0938-1829 ?SLP Individual Time Calculation (min): 28 min ? ?Short Term Goals: ?Week 3: SLP Short Term Goal 1 (Week 3): STG=LTG due to ELOS 3/7 ? ?Skilled Therapeutic Interventions: ?Pt seen for skilled ST with focus on cognitive goals, pt in bed with wife present throughout and agreeable to therapeutic tasks. Pt initially with speech intelligibility 85% at phrase/sentence level however this diminished throughout tx session with intelligibility ~40% at conclusion. Discussed with wife and pt strategies to utilize to increase speech intelligibility, SLP providing written aid to increase in recall and implementation (SLOP). With extra time and min cues, pt able to recall SLOP acronym at 2 and 5 minute delay, poor ability to utilize to increase speech intelligibility despite max cues. Pt and wife report concern with decreased memory, educated on strategies to increase recall of functional information and communication of basic wants/needs at discharge. SLP facilitating simple 4-step sequencing task providing overall mod A cues for 50% accuracy this date. Pt left in bed with alarm set and with spouse present for needs. Cont ST POC.  ? ?Pain ?Pain Assessment ?Pain Scale: 0-10 ?Pain Score: 0-No pain ?Faces Pain Scale: No hurt ? ?Therapy/Group: Individual Therapy ? ?Dewaine Conger ?08/15/2021, 11:54 AM ?

## 2021-08-15 NOTE — Progress Notes (Signed)
Physical Therapy Session Note ? ?Patient Details  ?Name: William [name] ?MRN: [id number] ?Date of Birth: 1941/06/06 ? ?Today's Date: 08/15/2021 ?PT Individual Time: 4268-3419 ?PT Individual Time Calculation (min): 35 min  ? ?Short Term Goals: ?Week 2:  PT Short Term Goal 1 (Week 2): Pt will complete supine<>sit w/ S using bed features ?PT Short Term Goal 2 (Week 2): Pt will complete sit<>stand minA from lowered bed ?PT Short Term Goal 3 (Week 2): Pt will ambulate 223f w/ LRAD minA ?PT Short Term Goal 4 (Week 2): Pt will complete 4 steps minA w/ LRAD ?Week 3:  PT Short Term Goal 1 (Week 3): STG = LTG due to ELOS ?   ? ?Skilled Therapeutic Interventions/Progress Updates:  ?Pt lying in bed with wife present, initiating changing his brief due to urinary incontinence.  He reported his L ribs "hurt a little bit." ? ?Pt bridged up allowing removal of wet brief.  Pt cleansed peri area with mod assist for pertinent areas, including rolling R with CGA and to L with min assist. ? ?R side lying> sitting with min assist and multimodal cues for knees to chest, then drop bil feet off of edge of bed and push up. Pt required intermittent min assistance for sitting balance as he drifted R and backwards with no protective strategies noted. In sitting, pt doffed T shirt with supervision and donned clean T shirt with mod cues for attention to L hemibody. ? ?Sit> stand from raised bed with cues for safe hand placement and CGA, needing 2 tries to stand completely. ? ?Gait training on level tile with multiple turns, x 150' with RW, CGA.  To increase step length, and velocity, pt benefited from auditory cues from PT, counting or snapping fingers. ? ?At end of session, pt seated in wc with pillow behind back for comfort, seat alarm set and needs at hand.  Wife present. ?   ? ?Therapy Documentation ?Precautions:  ?Precautions ?Precautions: Fall ?Precaution Comments: L 11th and 12th rib fxs ?Restrictions ?Weight Bearing Restrictions: No ?   ? ? ? ?Therapy/Group: Individual Therapy ? ?Alessa Mazur ?08/15/2021, 12:29 PM  ?

## 2021-08-16 LAB — GLUCOSE, CAPILLARY
Glucose-Capillary: 105 mg/dL — ABNORMAL HIGH (ref 70–99)
Glucose-Capillary: 126 mg/dL — ABNORMAL HIGH (ref 70–99)
Glucose-Capillary: 113 mg/dL — ABNORMAL HIGH (ref 70–99)
Glucose-Capillary: 132 mg/dL — ABNORMAL HIGH (ref 70–99)

## 2021-08-16 NOTE — Progress Notes (Signed)
PROGRESS NOTE   Subjective/Complaints: Pt in bed. Tells me he slept well. Denied pain today  ROS: Patient denies fever, rash, sore throat, blurred vision, dizziness, nausea, vomiting, diarrhea, cough, shortness of breath or chest pain, joint or back/neck pain, headache, or mood change.   Objective:   No results found. No results for input(s): WBC, HGB, HCT, PLT in the last 72 hours.    Recent Labs    08/14/21 1210  NA 135  K 4.4  CL 105  CO2 22  GLUCOSE 106*  BUN 17  CREATININE 1.86*  CALCIUM 9.3     Intake/Output Summary (Last 24 hours) at 08/16/2021 0933 Last data filed at 08/16/2021 0700 Gross per 24 hour  Intake 2943 ml  Output 200 ml  Net 2743 ml        Physical Exam: Vital Signs Blood pressure (!) 151/76, pulse 67, temperature 97.6 F (36.4 C), resp. rate 16, height _0  (1.803 m), weight 91.4 kg, SpO2 100 %. Constitutional: No distress . Vital signs reviewed. HEENT: NCAT, EOMI, oral membranes moist Neck: supple Cardiovascular: RRR without murmur. No JVD    Respiratory/Chest: CTA Bilaterally without wheezes or rales. Normal effort    GI/Abdomen: BS +, non-tender, non-distended Ext: no clubbing, cyanosis, or edema Psych: pleasant and cooperative   Skin: intact Musculoskeletal: No tenderness in extremities. Neuro: alert, follows commands. STM deficits. Resting tremor of hands and legs, frequent tongue movements Motor: moves all 4's, 3-4/5   Assessment/Plan: 1. Functional deficits which require 3+ hours per day of interdisciplinary therapy in a comprehensive inpatient rehab setting. Physiatrist is providing close team supervision and 24 hour management of active medical problems listed below. Physiatrist and rehab team continue to assess barriers to discharge/monitor patient progress toward functional and medical goals  Care Tool:  Bathing    Body parts bathed by patient: Right arm, Left arm,  Chest, Abdomen, Front perineal area, Buttocks, Right upper leg, Left upper leg, Face   Body parts bathed by helper: Right lower leg, Left lower leg     Bathing assist Assist Level: Minimal Assistance - Patient > 75%     Upper Body Dressing/Undressing Upper body dressing   What is the patient wearing?: Pull over shirt    Upper body assist Assist Level: Moderate Assistance - Patient 50 - 74%    Lower Body Dressing/Undressing Lower body dressing      What is the patient wearing?: Pants, Underwear/pull up     Lower body assist Assist for lower body dressing: Moderate Assistance - Patient 50 - 74%     Toileting Toileting    Toileting assist Assist for toileting: Minimal Assistance - Patient > 75%     Transfers Chair/bed transfer  Transfers assist     Chair/bed transfer assist level: Minimal Assistance - Patient > 75%     Locomotion Ambulation   Ambulation assist      Assist level: Contact Guard/Touching assist Assistive device: Walker-rolling Max distance: 150   Walk 10 feet activity   Assist     Assist level: Contact Guard/Touching assist Assistive device: Walker-rolling   Walk 50 feet activity   Assist Walk 50 feet with 2 turns activity  did not occur: Safety/medical concerns  Assist level: Minimal Assistance - Patient > 75% Assistive device: Walker-rolling    Walk 150 feet activity   Assist Walk 150 feet activity did not occur: Safety/medical concerns  Assist level: Contact Guard/Touching assist Assistive device: Walker-rolling    Walk 10 feet on uneven surface  activity   Assist Walk 10 feet on uneven surfaces activity did not occur: Safety/medical concerns         Wheelchair     Assist Is the patient using a wheelchair?: Yes Type of Wheelchair: Manual    Wheelchair assist level: Total Assistance - Patient < 25% Max wheelchair distance: 150    Wheelchair 50 feet with 2 turns activity    Assist        Assist  Level: Total Assistance - Patient < 25%   Wheelchair 150 feet activity     Assist      Assist Level: Total Assistance - Patient < 25%   Blood pressure (!) 151/76, pulse 67, temperature 97.6 F (36.4 C), resp. rate 16, height _0  (1.803 m), weight 91.4 kg, SpO2 100 %.  Medical Problem List and Plan: 1. 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 2.  Impaired mobility:  -DVT/anticoagulation:  Mechanical: Sequential compression devices, below knee Bilateral lower extremities; Lovenox d/ced given brain hemorrhage.              -antiplatelet therapy: aspirin 3. Rib fracture pain: Tylenol, tramadol as needed. Start lidocaine patch. Discussed that this should be applied directly over area of pain. Provided with pain relief journal 4. Depression: Increase trazodone to 89m HS LCSW asked to evaluate and provide emotional support             --continue carbamazepine              -antipsychotic agents: Not applicable 5. Neuropsych: This patient is not capable of making decisions on his own behalf. 6. Skin/Wound Care: Routine skin care checks 7. 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  Creatinine 1.97 on 2/22, 2.01 on 2/28, 1.86 3/3  --f/u labs next  week 16: Transaminitis: Monitor chemistries 17: Hypertension: Continue Norvasc and hydralazine. Continue heart healthy diet             -- Home Zestoretic held secondary to kidney function.   3/5 fair control 18: Hyperlipidemia: Continue fenofibrate 19: Bilateral carotid artery stenosis: Asymptomatic             -- Status post bilateral carotid endarterectomies 20: Anemia, chronic disease, hemodilution etiologies likely: Monitor CBC             -- Fecal occult blood negative 2/13             -- Colonoscopy 2016, polyps.  Recall 5 years 21. Prediabetes: check CBGs AC/HS.   Controlled on 2/25  22. Hypokalemia: supplement 60mq daily. Repeat potassium today.  23. Suboptimal vitamin D level: continue 2000U daily.  24. Insomnia: Started trazodone 59mHS, increased to 75 start sleep chart.  Appears to be improving. Placed nursing order for sign on the door to not wake patient between 9pm and 7am 25. Worsening of cognitive status with no new focal weakness, now stable/improved, recent labwork and vitals unremarkable , unremarkable CXR, per Dr XuErlinda Hongepeat CT head twith small punctate hemorrhage, AMS likely multifactorial , hx mild dementia, on benzo, bilateral infarcts.   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 Discussed diagnoses and treatment options with wife and therapy. Recommended outpatient Tai Chi.  27. Overweight BMI 29.2->28.10 provide dietary education 28. Decreased initiation: start amantadine today, discussed that this could worsen insomnia. Discussed increased dose but wife feels we can maintain current dose for now 29. Tremor: discussed risks and benefits of propanolol.Continue amantadine which is helping. Discussed with wife that slight worsening today could be due to UTI. Continue weighted utensils which are helping. 30. Urinary incontinence: placed order for bladder program. UA/UC ordered and UC with  multiple species, repeat UC with <10,000 insignifant growth    -3/5 continue Empiric Keflex BID given symptoms--continued per wife's preference despite ucx results 31. Pruritis: Sarna ordered, improved   LOS: 18 days A FACE TO FAWynona/10/2021, 9:33 AM

## 2021-08-16 NOTE — Progress Notes (Signed)
Physical Therapy Session Note ? ?Patient Details  ?Name: William [name] ?MRN: [id number] ?Date of Birth: [birth year] ? ?Today's Date: 08/16/2021 ?PT Individual Time: 0905-1010 ?PT Individual Time Calculation (min): 65 min  ? ?Short Term Goals: ?Week 2:  PT Short Term Goal 1 (Week 2): Pt will complete supine<>sit w/ S using bed features ?PT Short Term Goal 2 (Week 2): Pt will complete sit<>stand minA from lowered bed ?PT Short Term Goal 3 (Week 2): Pt will ambulate 273f w/ LRAD minA ?PT Short Term Goal 4 (Week 2): Pt will complete 4 steps minA w/ LRAD ?Week 3:  PT Short Term Goal 1 (Week 3): STG = LTG due to ELOS ? ?Skilled Therapeutic Interventions/Progress Updates:  ?Pt resting in bed.  He stated this his L ribs "were a little sore; premedicated. ? ?Mark, NT helping pt to change brief due to urinary incontinence. Pt bridged up multiple times to assist with doffing wet brief and donning dry brief, after cleansing.  PT threaded pants over feet and pulled up to thighs; pt needed min assist to pull pants completely over hips, spontaneously using L hand and bridging up. ? ?neuromuscular re-education via demo, multimodal cues in supine: lower trunk rotation in supine. Seated: alternating long arc quad knee extensions, abdominal crunches with hand taps R/L forward onto PT's hands.  Forward wt shifting in wc via trunk flexion/extension while pushing/pulling a rolling stool in front of him. Prolonged stretch L hamstrings sitting in wc, using footstool, x 1 min. ? ?Pt requires mod assist to scoot forward in wc prior to standing.  Sit> stand with min assist, counting aloud 1-2-3 and placing L hand only on Rw.  Gait training with RW on level tile x 150' including L/R turns, with CGa except for 1 LOB when R side of RW lifted off the ground, requiring mod assist to recover.  VCs for longer step lengths, upright runk and froward gaze ,with minimal carry -over.  57M timed test= 26 seconds.  Up/down 8" high step as per home entry, RW,  up with mod assist for balance and RW mgt, and max cues, down with max assist 1st trial, as pt insisted in stepping down with R foot; 2nd trial up and down with min assist for balance and RW mgt. ? ?At end of session, pt seated in wc with needs at hand and seat belt alarm set. ?   ? ?Therapy Documentation ?Precautions:  ?Precautions ?Precautions: Fall ?Precaution Comments: L 11th and 12th rib fxs ?Restrictions ?Weight Bearing Restrictions: No ? ?  ? ? ? ?Therapy/Group: Individual Therapy ? ?Rosalind Guido ?08/16/2021, 10:22 AM  ?

## 2021-08-17 ENCOUNTER — Other Ambulatory Visit (HOSPITAL_BASED_OUTPATIENT_CLINIC_OR_DEPARTMENT_OTHER): Payer: Self-pay

## 2021-08-17 ENCOUNTER — Other Ambulatory Visit (HOSPITAL_COMMUNITY): Payer: Self-pay

## 2021-08-17 ENCOUNTER — Encounter (HOSPITAL_BASED_OUTPATIENT_CLINIC_OR_DEPARTMENT_OTHER): Payer: Self-pay

## 2021-08-17 ENCOUNTER — Ambulatory Visit (HOSPITAL_BASED_OUTPATIENT_CLINIC_OR_DEPARTMENT_OTHER): Admit: 2021-08-17 | Payer: PPO | Admitting: Urology

## 2021-08-17 ENCOUNTER — Ambulatory Visit: Payer: PPO | Admitting: Physical Therapy

## 2021-08-17 ENCOUNTER — Ambulatory Visit: Payer: PPO | Admitting: Family Medicine

## 2021-08-17 LAB — GLUCOSE, CAPILLARY
Glucose-Capillary: 96 mg/dL (ref 70–99)
Glucose-Capillary: 117 mg/dL — ABNORMAL HIGH (ref 70–99)
Glucose-Capillary: 107 mg/dL — ABNORMAL HIGH (ref 70–99)
Glucose-Capillary: 144 mg/dL — ABNORMAL HIGH (ref 70–99)

## 2021-08-17 SURGERY — TURBT (TRANSURETHRAL RESECTION OF BLADDER TUMOR)
Anesthesia: General

## 2021-08-17 MED ORDER — AMLODIPINE BESYLATE 10 MG PO TABS
10.0000 mg | ORAL_TABLET | Freq: Every morning | ORAL | 0 refills | Status: DC
  Filled 2021-08-17 – 2021-10-06 (×2): qty 30, 30d supply, fill #0

## 2021-08-17 MED ORDER — DOCUSATE SODIUM 100 MG PO CAPS: 100.0000 mg | ORAL_CAPSULE | Freq: Every day | ORAL | 0 refills | Status: DC

## 2021-08-17 MED ORDER — AMANTADINE HCL 100 MG PO CAPS
100.0000 mg | ORAL_CAPSULE | Freq: Every day | ORAL | 0 refills | Status: DC
Start: 2021-08-17 — End: 2021-09-15
  Filled 2021-08-17: qty 30, 30d supply, fill #0

## 2021-08-17 MED ORDER — PANTOPRAZOLE SODIUM 40 MG PO TBEC
40.0000 mg | DELAYED_RELEASE_TABLET | Freq: Every day | ORAL | 0 refills | Status: DC
  Filled 2021-08-17: qty 30, 30d supply, fill #0

## 2021-08-17 MED ORDER — CLOPIDOGREL BISULFATE 75 MG PO TABS
75.0000 mg | ORAL_TABLET | Freq: Every day | ORAL | 0 refills | Status: DC
  Filled 2021-08-17: qty 30, 30d supply, fill #0

## 2021-08-17 MED ORDER — CARBAMAZEPINE ER 100 MG PO TB12
100.0000 mg | ORAL_TABLET | Freq: Every morning | ORAL | 0 refills | Status: DC
  Filled 2021-08-17: qty 30, 30d supply, fill #0

## 2021-08-17 MED ORDER — TAMSULOSIN HCL 0.4 MG PO CAPS
0.4000 mg | ORAL_CAPSULE | Freq: Every day | ORAL | 0 refills | Status: DC
  Filled 2021-08-17: qty 30, 30d supply, fill #0

## 2021-08-17 MED ORDER — TRAMADOL HCL 50 MG PO TABS: 50.0000 mg | ORAL_TABLET | Freq: Two times a day (BID) | ORAL | Status: DC | PRN

## 2021-08-17 MED ORDER — ACETAMINOPHEN 325 MG PO TABS: 325.0000 mg | ORAL_TABLET | ORAL | Status: DC | PRN

## 2021-08-17 MED ORDER — HYDRALAZINE HCL 25 MG PO TABS: 25.0000 mg | ORAL_TABLET | Freq: Three times a day (TID) | ORAL | 0 refills | Status: DC

## 2021-08-17 MED ORDER — LORAZEPAM 0.5 MG PO TABS
0.2500 mg | ORAL_TABLET | Freq: Every day | ORAL | 0 refills | Status: DC
  Filled 2021-08-17: qty 30, 60d supply, fill #0

## 2021-08-17 MED ORDER — TRAZODONE HCL 150 MG PO TABS
75.0000 mg | ORAL_TABLET | Freq: Every day | ORAL | 0 refills | Status: DC
  Filled 2021-08-17: qty 30, 60d supply, fill #0

## 2021-08-17 MED ORDER — FENOFIBRATE 160 MG PO TABS
160.0000 mg | ORAL_TABLET | Freq: Every day | ORAL | 0 refills | Status: DC
  Filled 2021-08-17: qty 30, 30d supply, fill #0

## 2021-08-17 MED ORDER — CARBAMAZEPINE ER 200 MG PO TB12
200.0000 mg | ORAL_TABLET | Freq: Every day | ORAL | 0 refills | Status: DC
  Filled 2021-08-17: qty 30, 30d supply, fill #0

## 2021-08-17 MED ORDER — LORAZEPAM 1 MG PO TABS
1.0000 mg | ORAL_TABLET | Freq: Every day | ORAL | 0 refills | Status: DC
  Filled 2021-08-17: qty 30, 30d supply, fill #0

## 2021-08-17 MED ORDER — ASPIRIN 81 MG PO TBEC
81.0000 mg | DELAYED_RELEASE_TABLET | Freq: Every day | ORAL | 11 refills | Status: DC
  Filled 2021-08-17: qty 30, 30d supply, fill #0
  Filled 2021-09-14: qty 120, 120d supply, fill #0

## 2021-08-17 MED ORDER — LIDOCAINE 5 % EX PTCH
1.0000 | MEDICATED_PATCH | CUTANEOUS | 1 refills | Status: DC
  Filled 2021-08-17 – 2021-08-18 (×2): qty 15, 15d supply, fill #0

## 2021-08-17 NOTE — Discharge Summary (Signed)
Physical Therapy Discharge Summary  Patient Details  Name: William [name] MRN: [id number] Date of Birth: 1940/11/22  Patient has met 9 of 9 long term goals due to improved activity tolerance, improved balance, increased strength, ability to compensate for deficits, improved attention, and improved awareness.  Patient to discharge at an ambulatory level [place].   Patient's care partner is independent to provide the necessary physical and cognitive assistance at discharge. Wife has been at the bedside for majority of patient's stay. She has observed therapy in the room as well as received hands on training in rehab gym. She is well aware of his deficits and the recommendations made by PT/OT/SLP for strict 24/7 care with PT goals set and achieved at minA overall.   Reasons goals not met: n/a  Recommendation:  Patient will benefit from ongoing skilled PT services in home health setting to continue to advance safe functional mobility, address ongoing impairments in bed mobility, functional transfers, home safety, dynamic standing balance, gait training, stair training, car transfers, motor apraxia, and minimize fall risk.  Equipment: Wife obtained w/c. RW  Reasons for discharge: treatment goals met and discharge from hospital  Patient/family agrees with progress made and goals achieved: Yes  PT Discharge Precautions/Restrictions Precautions Precautions: Fall Precaution Comments: Parkinson's Restrictions Weight Bearing Restrictions: No Vital Signs Therapy Vitals Temp: 97.9 F (36.6 C) Temp Source: Oral Pulse Rate: 65 Resp: 18 BP: (!) 162/84 Patient Position (if appropriate): Lying Oxygen Therapy SpO2: 98 % O2 Device: Room Air Pain Pain Assessment Pain Scale: 0-10 Pain Score: 8  Pain Type: Acute pain;Chronic pain Pain Location: Rib cage Pain Orientation: Left Pain Descriptors / Indicators: Aching;Sore Pain Intervention(s): Emotional support;Ambulation/increased  activity Pain Interference Pain Interference Pain Effect on Sleep: 1. Rarely or not at all Pain Interference with Therapy Activities: 1. Rarely or not at all Pain Interference with Day-to-Day Activities: 1. Rarely or not at all Vision/Perception  Vision - History Ability to See in Adequate Light: 0 Adequate Perception Perception: Impaired Inattention/Neglect: Does not attend to left side of body Praxis Praxis: Impaired Praxis Impairment Details: Initiation;Motor planning;Perseveration  Cognition Overall Cognitive Status: History of cognitive impairments - at baseline Arousal/Alertness: Awake/alert Orientation Level: Oriented to person;Oriented to place;Oriented to situation;Disoriented to time Year: 2023 Month: March Day of Week: Incorrect Attention: Sustained Sustained Attention: Impaired Sustained Attention Impairment: Verbal basic;Functional basic Selective Attention: Impaired Selective Attention Impairment: Verbal basic;Functional basic Memory: Impaired Memory Impairment: Decreased recall of new information;Decreased short term memory Decreased Short Term Memory: Functional basic;Verbal basic Immediate Memory Recall: Sock;Blue;Bed Memory Recall Sock: Without Cue Memory Recall Blue: Not able to recall Memory Recall Bed: Not able to recall Awareness: Impaired Awareness Impairment: Emergent impairment;Anticipatory impairment Problem Solving: Impaired Problem Solving Impairment: Functional basic;Verbal basic Executive Function: Decision Making;Sequencing Sequencing: Impaired Sequencing Impairment: Verbal basic;Functional basic Decision Making: Impaired Decision Making Impairment: Functional basic Behaviors: Restless Safety/Judgment: Impaired Sensation Sensation Light Touch: Appears Intact Hot/Cold: Not tested Proprioception: Impaired Detail Proprioception Impaired Details: Impaired LUE;Impaired LLE Stereognosis: Appears Intact Coordination Gross Motor Movements  are Fluid and Coordinated: No Fine Motor Movements are Fluid and Coordinated: No Coordination and Movement Description: Movement patterns impacted by cognitive deficits, motor perseverations, delayed initation, trunk ridigity, and parkinson's Motor  Motor Motor: Abnormal postural alignment and control;Hemiplegia;Motor perseverations Motor - Discharge Observations: paresis has improved; motor continues to be impacted by Parkinson's, perseverations, delayed initiation  Mobility Bed Mobility Bed Mobility: Rolling Right;Rolling Left;Supine to Sit;Sit to Supine Rolling Right: Minimal Assistance - Patient > 75% Rolling  Left: Minimal Assistance - Patient > 75% Supine to Sit: Minimal Assistance - Patient > 75% Sit to Supine: Minimal Assistance - Patient > 75% Transfers Transfers: Sit to Stand;Stand to Sit;Stand Pivot Transfers Sit to Stand: Contact Guard/Touching assist;Minimal Assistance - Patient > 75% Stand to Sit: Contact Guard/Touching assist;Minimal Assistance - Patient > 75% Stand Pivot Transfers: Minimal Assistance - Patient > 75% Stand Pivot Transfer Details: Verbal cues for sequencing;Verbal cues for technique;Verbal cues for precautions/safety;Verbal cues for safe use of DME/AE;Verbal cues for gait pattern;Tactile cues for posture;Tactile cues for weight shifting;Tactile cues for sequencing;Tactile cues for initiation;Visual cues for safe use of DME/AE Transfer (Assistive device): Rolling walker Locomotion  Gait Ambulation: Yes Gait Assistance: Contact Guard/Touching assist;Minimal Assistance - Patient > 75% Gait Distance (Feet): 150 Feet Assistive device: Rolling walker Gait Assistance Details: Verbal cues for technique;Verbal cues for sequencing;Verbal cues for safe use of DME/AE;Verbal cues for precautions/safety;Verbal cues for gait pattern;Visual cues for safe use of DME/AE;Tactile cues for sequencing;Tactile cues for initiation Gait Gait: Yes Gait Pattern: Impaired Gait  Pattern: Step-through pattern;Decreased step length - right;Decreased step length - left;Decreased dorsiflexion - left;Decreased dorsiflexion - right;Shuffle;Trunk flexed;Poor foot clearance - right;Poor foot clearance - left;Narrow base of support;Decreased trunk rotation Gait velocity: decr Stairs / Additional Locomotion Stairs: Yes Stairs Assistance: Minimal Assistance - Patient > 75% Stair Management Technique: Two rails Number of Stairs: 12 Height of Stairs: 6 Curb: Minimal Assistance - Patient >75% Wheelchair Mobility Wheelchair Mobility: No  Trunk/Postural Assessment  Cervical Assessment Cervical Assessment: Exceptions to [facility] (forward head, limited cervical rotation both directions) Thoracic Assessment Thoracic Assessment: Exceptions to WFL (rounded shlders, trunk rigiditiy) Lumbar Assessment Lumbar Assessment: Exceptions to [facility] (posterior pelvic tilt) Postural Control Postural Control: Deficits on evaluation Righting Reactions: delayed and inadequate Protective Responses: delayed and inadequate Postural Limitations: Ridgid  Balance Balance Balance Assessed: Yes Static Sitting Balance Static Sitting - Balance Support: Feet supported;No upper extremity supported Static Sitting - Level of Assistance: 5: Stand by assistance Dynamic Sitting Balance Dynamic Sitting - Balance Support: Feet supported;During functional activity Dynamic Sitting - Level of Assistance: 4: Min assist Static Standing Balance Static Standing - Balance Support: Bilateral upper extremity supported Static Standing - Level of Assistance: Other (comment) (CGA) Dynamic Standing Balance Dynamic Standing - Balance Support: During functional activity;Bilateral upper extremity supported Dynamic Standing - Level of Assistance: 4: Min assist Extremity Assessment      RLE Assessment RLE Assessment: Exceptions to [facility] RLE Strength RLE Overall Strength: Deficits Right Hip Flexion: 4-/5 Right Hip ABduction:  4-/5 Right Hip ADduction: 3+/5 Right Knee Flexion: 4-/5 Right Knee Extension: 4/5 Right Ankle Dorsiflexion: 4-/5 Right Ankle Plantar Flexion: 3+/5 LLE Assessment LLE Assessment: Exceptions to [facility] LLE Strength LLE Overall Strength: Deficits Left Hip ABduction: 3+/5 Left Hip ADduction: 3+/5 Left Knee Flexion: 4-/5 Left Knee Extension: 4-/5 Left Ankle Dorsiflexion: 4-/5 Left Ankle Plantar Flexion: 3+/5    Orbin Mayeux P Raden Byington PT 08/17/2021, 7:46 AM

## 2021-08-17 NOTE — Discharge Instructions (Addendum)
Inpatient Rehab Discharge Instructions ? ?William [name] ?Discharge date and time: 08/18/2021 ? ?Activities/Precautions/ Functional Status: ?Activity: no lifting, driving, or strenuous exercise for until cleared by MD ?Diet: cardiac diet ?Wound Care: none needed ? ?Functional status:  ?___ No restrictions     ___ Walk up steps independently ?_x__ 24/7 supervision/assistance   ___ Walk up steps with assistance ?___ Intermittent supervision/assistance  ___ Bathe/dress independently ?___ Walk with walker     ___ Bathe/dress with assistance ?___ Walk Independently    ___ Shower independently ?___ Walk with assistance    __x_ Shower with assistance ?_x__ No alcohol     ___ Return to work/school ________ ? ?Special Instructions: ? ?No driving, alcohol consumption or tobacco use.  ? ?COMMUNITY REFERRALS UPON DISCHARGE:   ? ?Home Health:   PT  OT  SP  AIDE ?               [facility] Phone: [phone]  ? ?Medical Equipment/Items Ordered:HOSPITAL BED, WHEELCHAIR, BEDSIDE COMMODE AND TUB BENCH WIFE TO GET ROLLING WALKER ON OWN ?                                                Agency/Supplier:[facility]  [phone] ? ? ?SMOKING Cigarette smoking nearly doubles your risk of having a stroke & is the single most alterable risk factor  ?If you smoke or have smoked in the last 12 months, you are advised to quit smoking for your health. Most of the excess cardiovascular risk related to smoking disappears within a year of stopping. ?Ask you doctor about anti-smoking medications ?[place] Quit Line: 1-800-QUIT NOW ?Free Smoking Cessation Classes [phone]  ?CHOLESTEROL Know your levels; limit fat & cholesterol in your diet  ?Lipid Panel  ?   ?Component Value Date/Time  ? CHOL 209 (H) 07/27/2021 0717  ? TRIG 73 07/27/2021 0717  ? HDL 69 07/27/2021 0717  ? CHOLHDL 3.0 07/27/2021 0717  ? VLDL 15 07/27/2021 0717  ? LDLCALC 125 (H) 07/27/2021 0717  ? ? ? Many patients benefit from treatment even if their cholesterol is at  goal. ?Goal: Total Cholesterol (CHOL) less than 160 ?Goal:  Triglycerides (TRIG) less than 150 ?Goal:  HDL greater than 40 ?Goal:  LDL (LDLCALC) less than 100 ?  ?BLOOD PRESSURE American Stroke Association blood pressure target is less that 120/80 mm/Hg  ?Your discharge blood pressure is:  BP: (!) 162/84 Monitor your blood pressure ?Limit your salt and alcohol intake ?Many individuals will require more than one medication for high blood pressure  ?DIABETES (A1c is a blood sugar average for last 3 months) Goal HGBA1c is under 7% (HBGA1c is blood sugar average for last 3 months)  ?Diabetes: ?No known diagnosis of diabetes   ? ?Lab Results  ?Component Value Date  ? HGBA1C 5.3 07/27/2021  ? ? Your HGBA1c can be lowered with medications, healthy diet, and exercise. ?Check your blood sugar as directed by your physician ?Call your physician if you experience unexplained or low blood sugars.  ?PHYSICAL ACTIVITY/REHABILITATION Goal is 30 minutes at least 4 days per week  ?Activity: Increase activity slowly, ?Therapies: Physical Therapy: Home Health, Occupational Therapy: Home Health, and Speech Therapy: Home Health ?Return to work: n/a Activity decreases your risk of heart attack and stroke and makes your heart stronger.  It helps control your weight and blood  pressure; helps you relax and can improve your mood. ?Participate in a regular exercise program. ?Talk with your doctor about the best form of exercise for you (dancing, walking, swimming, cycling).  ?DIET/WEIGHT Goal is to maintain a healthy weight  ?Your discharge diet is:  ?Diet Order   ? ?       ?  Diet Heart Room service appropriate? Yes; Fluid consistency: Thin  Diet effective now       ?  ? ?  ?  ? ?  ? thin liquids ?Your height is:  Height: '5\' 11"'$  (180.3 cm) ?Your current weight is: Weight: 91.4 kg ?Your Body Mass Index (BMI) is:  BMI (Calculated): 28.12 Following the type of diet specifically designed for you will help prevent another stroke. ?Your goal weight  range is:   ?Your goal Body Mass Index (BMI) is 19-24. ?Healthy food habits can help reduce 3 risk factors for stroke:  High cholesterol, hypertension, and excess weight.  ?RESOURCES Stroke/Support Group:  Call [phone] ?  ?STROKE EDUCATION PROVIDED/REVIEWED AND GIVEN TO PATIENT Stroke warning signs and symptoms ?How to activate emergency medical system (call 911). ?Medications prescribed at discharge. ?Need for follow-up after discharge. ?Personal risk factors for stroke. ?Pneumonia vaccine given: No ?Flu vaccine given: No ?My questions have been answered, the writing is legible, and I understand these instructions.  I will adhere to these goals & educational materials that have been provided to me after my discharge from the hospital.  ? ? My questions have been answered and I understand these instructions. I will adhere to these goals and the provided educational materials after my discharge from the hospital. ? ?Patient/Caregiver Signature _______________________________ Date __________ ? ?Clinician Signature _______________________________________ Date __________ ? ?Please bring this form and your medication list with you to all your follow-up doctor's appointments.  STROKE/TIA DISCHARGE INSTRUCTIONS ?

## 2021-08-17 NOTE — Progress Notes (Addendum)
Inpatient Rehabilitation Discharge Medication Review by a Pharmacist ? ?A complete drug regimen review was completed for this patient to identify any potential clinically significant medication issues. ? ?High Risk Drug Classes Is patient taking? Indication by Medication  ?Antipsychotic No   ?Anticoagulant No   ?Antibiotic Yes PO Keflex for UTI-completed course  ?Opioid No   ?Antiplatelet Yes Plavix, aspirin for CVA ppx   ?Hypoglycemics/insulin No   ?Vasoactive Medication Yes Norvasc, hydralazine for BP  ?Chemotherapy No   ?Other Yes Fenofibrate - Triglycerides ?Protonix - GERD ?Robaxin - spasms ?Ativan - anxiety ?Tegretol - sz ?Trazodone - sleep, depression ?Amantadine-tremor, initiation  ? ? ? ?Type of Medication Issue Identified Description of Issue Recommendation(s)  ?Drug Interaction(s) (clinically significant) ?    ?Duplicate Therapy ?    ?Allergy ?    ?No Medication Administration End Date ?    ?Incorrect Dose ?    ?Additional Drug Therapy Needed ?    ?Significant med changes from prior encounter (inform family/care partners about these prior to discharge).    ?Other ?    ? ? ?Clinically significant medication issues were identified that warrant physician communication and completion of prescribed/recommended actions by midnight of the next day:  No ? ?Pharmacist comments: None ? ?Time spent performing this drug regimen review (minutes):  20 minutes ? ? ?Tad Moore ?08/17/2021 8:13 AM ?

## 2021-08-17 NOTE — Progress Notes (Signed)
Physical Therapy Session Note ? ?Patient Details  ?Name: William [name] ?MRN: [id number] ?Date of Birth: January 01, 1941 ? ?Today's Date: 08/17/2021 ?PT Individual Time: 7793-9030 ?PT Individual Time Calculation (min): 70 min  ? 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 ?Skilled Therapeutic Interventions/Progress Updates:  ?   ?Pt sitting in w/c to start session. Agreeable to PT tx. Denies pain. Spent first 15 minutes with wife and patient discussing DC planning, home safety, DME rec's, f/u therapy progression, energy conservation strategies, and fatigue levels that impact his balance and raise his falls risk. Wife and pt with no further questions regarding upcoming DC. Wife denies opportunity for further hands on training as she's had plenty of training during his stay. ? ?Transported downstairs to 79M rehab gym for remainder of session. Instructed in sit<>stand to RW from w/c with CGA. Ambulated ~154f with CGA and RW through rehab hallways, requiring minA for turning and managing RW through doorways. Primary gait deficits include short shuffling steps, forward flexed trunk, rigid movement, downward gaze, and decreased gait speed. When cued for "big steps" he would take 1-2 of these but unable to maintain. ? ?Instructed in stair training where he navigated up/down x12, 6inch steps, with CGA and 2 hand rails. VC needed for general safety awareness, sequencing, and technique. He had trouble with turning 180deg at the top of the steps but otherwise demonstrated appropriate understanding of stair negotiation. ? ?TUG 57s with RW and CGA.  ?*Scores >13.5 seconds indicate increased falls risk.  ? ?Completed ambulatory car transfer with RW and minA - assist needed for managing BLE's into the car and anticipate this transition will be more smooth with his own vehicle due to ability to slide seat backwards and less of a threshold to step over.  ? ?Returned upstairs to his room in w/c for  time. Remained seated in w/c with safety belt alarm on, call bell in hands, wife at bedside.  ? ?Therapy Documentation ?Precautions:  ?Precautions ?Precautions: Fall ?Precaution Comments: L 11th and 12th rib fxs ?Restrictions ?Weight Bearing Restrictions: No ?General: ?  ? ?Therapy/Group: Individual Therapy ? ?Elexia Friedt P Burlin Mcnair ?08/17/2021, 7:30 AM  ?

## 2021-08-17 NOTE — Progress Notes (Signed)
PROGRESS NOTE   Subjective/Complaints: No new complaints this morning Discussed continuing with trazodone given side effects of Lunesta.  Discussed improved creatinine and potassium.   ROS: Patient denies fever, rash, sore throat, blurred vision, dizziness, nausea, vomiting, diarrhea, cough, shortness of breath or chest pain, joint or back/neck pain, headache, or mood change.   Objective:   No results found. No results for input(s): WBC, HGB, HCT, PLT in the last 72 hours.    No results for input(s): NA, K, CL, CO2, GLUCOSE, BUN, CREATININE, CALCIUM in the last 72 hours.    Intake/Output Summary (Last 24 hours) at 08/17/2021 1231 Last data filed at 08/17/2021 0844 Gross per 24 hour  Intake 358 ml  Output --  Net 358 ml        Physical Exam: Vital Signs Blood pressure (!) 162/84, pulse 65, temperature 97.9 F (36.6 C), temperature source Oral, resp. rate 18, height $RemoveBe'5\' 11"'LxQRwClkA$  (1.803 m), weight 91.4 kg, SpO2 98 %. Constitutional: No distress . Vital signs reviewed. BMI 28.10 HEENT: NCAT, EOMI, oral membranes moist Neck: supple Cardiovascular: RRR without murmur. No JVD    Respiratory/Chest: CTA Bilaterally without wheezes or rales. Normal effort    GI/Abdomen: BS +, non-tender, non-distended Ext: no clubbing, cyanosis, or edema Psych: pleasant and cooperative   Skin: intact Musculoskeletal: No tenderness in extremities. Neuro: alert, follows commands. STM deficits. Resting tremor of hands and legs, frequent tongue movements Motor: moves all 4's, 3-4/5   Assessment/Plan: 1. Functional deficits which require 3+ hours per day of interdisciplinary therapy in a comprehensive inpatient rehab setting. Physiatrist is providing close team supervision and 24 hour management of active medical problems listed below. Physiatrist and rehab team continue to assess barriers to discharge/monitor patient progress toward functional and  medical goals  Care Tool:  Bathing    Body parts bathed by patient: Right arm, Left arm, Chest, Abdomen, Front perineal area, Buttocks, Right upper leg, Left upper leg, Face   Body parts bathed by helper: Right lower leg, Left lower leg     Bathing assist Assist Level: Minimal Assistance - Patient > 75%     Upper Body Dressing/Undressing Upper body dressing   What is the patient wearing?: Pull over shirt    Upper body assist Assist Level: Moderate Assistance - Patient 50 - 74%    Lower Body Dressing/Undressing Lower body dressing      What is the patient wearing?: Pants, Underwear/pull up     Lower body assist Assist for lower body dressing: Moderate Assistance - Patient 50 - 74%     Toileting Toileting    Toileting assist Assist for toileting: Minimal Assistance - Patient > 75%     Transfers Chair/bed transfer  Transfers assist     Chair/bed transfer assist level: Minimal Assistance - Patient > 75%     Locomotion Ambulation   Ambulation assist      Assist level: Moderate Assistance - Patient 50 - 74% Assistive device: Walker-rolling Max distance: 150   Walk 10 feet activity   Assist     Assist level: Contact Guard/Touching assist Assistive device: Walker-rolling   Walk 50 feet activity   Assist Walk 50 feet with  2 turns activity did not occur: Safety/medical concerns  Assist level: Contact Guard/Touching assist Assistive device: Walker-rolling    Walk 150 feet activity   Assist Walk 150 feet activity did not occur: Safety/medical concerns  Assist level: Moderate Assistance - Patient - 50 - 74% Assistive device: Walker-rolling    Walk 10 feet on uneven surface  activity   Assist Walk 10 feet on uneven surfaces activity did not occur: Safety/medical concerns         Wheelchair     Assist Is the patient using a wheelchair?: Yes Type of Wheelchair: Manual    Wheelchair assist level: Total Assistance - Patient <  25% Max wheelchair distance: 150    Wheelchair 50 feet with 2 turns activity    Assist        Assist Level: Total Assistance - Patient < 25%   Wheelchair 150 feet activity     Assist      Assist Level: Total Assistance - Patient < 25%   Blood pressure (!) 162/84, pulse 65, temperature 97.9 F (36.6 C), temperature source Oral, resp. rate 18, height $RemoveBe'5\' 11"'PTcaSddht$  (1.803 m), weight 91.4 kg, SpO2 98 %.  Medical Problem List and Plan: 1. 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 2.  Impaired mobility:  -DVT/anticoagulation:  Mechanical: Sequential compression devices, below knee Bilateral lower extremities; Lovenox d/ced given brain hemorrhage.              -antiplatelet therapy: aspirin 3. Rib fracture pain: Tylenol, tramadol as needed. Start lidocaine patch. Discussed that this should be applied directly over area of pain. Provided with pain relief journal 4. Depression: Increase trazodone to $RemoveBefo'75mg'StLVXdxJTBV$  HS LCSW asked to evaluate and provide emotional support             --continue carbamazepine              -antipsychotic agents: Not applicable 5. Neuropsych: This patient is not capable of making decisions on his own behalf. 6. Skin/Wound Care: Routine skin care checks 7. 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  Creatinine 1.97 on 2/22, 2.01 on 2/28, 1.86 3/3  --f/u labs next week 16: Transaminitis: Monitor chemistries 17: Hypertension: Continue Norvasc and hydralazine. Continue heart healthy diet             -- Home Zestoretic held secondary to kidney function.   3/5 fair control 18: Hyperlipidemia: Continue fenofibrate 19: Bilateral carotid artery stenosis: Asymptomatic             -- Status post bilateral carotid endarterectomies 20: Anemia, chronic disease, hemodilution etiologies likely: Monitor CBC             -- Fecal occult blood negative 2/13             -- Colonoscopy 2016, polyps.  Recall 5 years 21. Prediabetes:  check CBGs AC/HS.   Controlled on 2/25 22. Hypokalemia: supplement 72meq daily. Repeat potassium today.  23. Suboptimal vitamin D level: continue 2000U daily.  24. Insomnia: Started trazodone $RemoveBeforeDEI'50mg'qBTdXvnnVOmaOcRI$  HS, increased to 75 start sleep chart.  Appears to be improving. Placed nursing order for sign on the door to not wake patient between 9pm and 7am 25. Worsening of cognitive status with no new focal weakness, now stable/improved, recent labwork and vitals unremarkable , unremarkable CXR, per Dr Erlinda Hong repeat CT head twith small punctate hemorrhage, AMS likely multifactorial , hx mild dementia, on benzo, bilateral infarcts.   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 Discussed diagnoses and treatment options with wife and therapy. Recommended outpatient Tai Chi. Printed out resources for group exercise therapy in [place].  27. Overweight BMI 29.2->28.10 provide dietary education 28. Decreased initiation: continue amantadine. Discussed increased dose but wife feels we can maintain current dose for now 29. Tremor: discussed risks and benefits of propanolol.Continue amantadine which is helping. Discussed with wife that slight worsening today could be  due to UTI. Continue weighted utensils which are helping. 30. Urinary incontinence: placed order for bladder program. UA/UC ordered and UC with multiple species, repeat UC with <10,000 insignifant growth    -3/5 continue Empiric Keflex BID given symptoms--continued per wife's preference despite ucx results -3/6: discussed end date of Keflex tomorrow.  31. Pruritis: Sarna ordered, improved   LOS: 19 days A FACE TO FACE EVALUATION WAS PERFORMED  Clide Deutscher Karlisha Mathena 08/17/2021, 12:31 PM

## 2021-08-17 NOTE — Progress Notes (Signed)
Occupational Therapy Discharge Summary ? ?Patient Details  ?Name: William [name] ?MRN: [id number] ?Date of Birth: Dec 10, 1940 ? ?Today's Date: 08/17/2021 ?OT Individual Time: 8088-1103 ?OT Individual Time Calculation (min): 60 min  ? ? ?Patient has met 9 of 10 long term goals due to improved activity tolerance, improved balance, postural control, ability to compensate for deficits, functional use of  RIGHT upper and RIGHT lower extremity, improved attention, improved awareness, and improved coordination.  Patient to discharge at [facility] Assist level.  Patient's care partner is independent to provide the necessary physical and cognitive assistance at discharge.   ? ?Reasons goals not met: Patients performance during toileting varies due to presence of incontinence at times requiring increased assist.   ? ?Recommendation:  ?Patient will benefit from ongoing skilled OT services in home health setting to continue to advance functional skills in the area of BADL. ? ?Equipment: ?Hospital bed, BSC, w/c, RW ? ?Reasons for discharge: treatment goals met and discharge from hospital ? ?Patient/family agrees with progress made and goals achieved: Yes ? ?Skilled Intervention: Pt semi reclined in bed, no c/o pain.  Wife at bedside and requesting to practice assisting pt while showering this session to increase her confidence.  Pt's wife able to assist appropriately throughout session needing only intermittent verbal cues from this therapist to improve hand placement on gait belt and body mechanics as well as assist with providing simple Vcs and increased time for pt to process during functional transfers. Pts wife assisted him to complete supine to sit, sit to stand, ambulation with RW to bathroom, stand pivot to shower bench, lateral scoot into shower, doffed shirt, UB/LB bathing in seated position, lateral scoot out of shower and stand pivot to w/c using RW.  Stand pivot to EOB using RW then donning of brief and pants, sit to  supine using bed features.  Discussed with wife recommendation of writing ADL items needed for showering in list form to reference prior to assisting pt with bathing to increase preparedness during task, due to wife relying on this therapist to retrieve items mid bathing. Also recommended to wife that she only complete sinkside bathing at dc to home until further training with [facility] OT completed.  Direct hand off to PT.   ? ?OT Discharge ?Precautions/Restrictions  ?Precautions ?Precautions: Fall ?Precaution Comments: Parkinson's ?Restrictions ?Weight Bearing Restrictions: No ?Pain ?Pain Assessment ?Pain Scale: 0-10 ?Pain Score: 0-No pain ?ADL ?ADL ?Eating: Supervision/safety ?Grooming: Minimal assistance ?Where Assessed-Grooming: Sitting at sink ?Upper Body Bathing: Minimal assistance, Maximal cueing (increased time) ?Where Assessed-Upper Body Bathing: Sitting at sink ?Lower Body Bathing: Minimal assistance, Maximal cueing (increased time) ?Where Assessed-Lower Body Bathing: Sitting at sink, Standing at sink ?Upper Body Dressing: Minimal assistance, Maximal cueing (increased time) ?Where Assessed-Upper Body Dressing: Sitting at sink ?Lower Body Dressing: Moderate assistance, Maximal cueing (increased time) ?Where Assessed-Lower Body Dressing: Wheelchair ?Toileting: Minimal assistance, Maximal assistance (pts performance fluctuates due to incontinence episodes) ?Where Assessed-Toileting: Toilet, Bedside Commode ?Toilet Transfer: Minimal assistance, Maximal verbal cueing ?Toilet Transfer Method: Ambulating ?Walk-In Shower Transfer: Moderate assistance ?Walk-In Shower Transfer Method: Ambulating ?Walk-In Shower Equipment: Radio broadcast assistant ?Vision ?Baseline Vision/History: 1 Wears glasses ?Patient Visual Report: No change from baseline ?Vision Assessment?: No apparent visual deficits ?Perception  ?Perception: Impaired ?Inattention/Neglect: Does not attend to left side of body ?Praxis ?Praxis: Impaired ?Praxis Impairment  Details: Initiation;Motor planning;Perseveration ?Cognition ?Overall Cognitive Status: History of cognitive impairments - at baseline ?Arousal/Alertness: Awake/alert ?Orientation Level: Oriented to person;Oriented to place;Oriented to situation ?Year: 2023 ?Month:  March ?Day of Week: Incorrect ?Attention: Sustained;Selective ?Sustained Attention: Impaired ?Sustained Attention Impairment: Verbal basic;Functional basic ?Selective Attention: Impaired ?Selective Attention Impairment: Verbal basic;Functional basic ?Memory: Impaired ?Memory Impairment: Decreased recall of new information;Decreased short term memory ?Decreased Short Term Memory: Functional basic;Verbal basic ?Immediate Memory Recall: Sock;Blue;Bed ?Memory Recall Sock: Without Cue ?Memory Recall Blue: Not able to recall ?Memory Recall Bed: Not able to recall ?Awareness Impairment: Emergent impairment;Anticipatory impairment ?Problem Solving: Impaired ?Problem Solving Impairment: Functional basic;Verbal basic ?Executive Function: Environmental health practitioner;Sequencing ?Sequencing: Impaired ?Sequencing Impairment: Verbal basic;Functional basic ?Decision Making: Impaired ?Decision Making Impairment: Functional basic ?Behaviors: Restless ?Safety/Judgment: Impaired ?Sensation ?Sensation ?Light Touch: Appears Intact ?Hot/Cold: Appears Intact ?Proprioception: Impaired Detail ?Proprioception Impaired Details: Impaired LUE;Impaired LLE ?Stereognosis: Appears Intact ?Coordination ?Gross Motor Movements are Fluid and Coordinated: No ?Fine Motor Movements are Fluid and Coordinated: No ?Coordination and Movement Description: Movement patterns impacted by cognitive deficits, motor perseverations, delayed initation, trunk ridigity, and parkinson's ?Finger Nose Finger Test: dysmetric with poor motor planning/sequencing ?Motor  ?Motor ?Motor: Abnormal postural alignment and control;Hemiplegia;Motor perseverations ?Motor - Discharge Observations: paresis has improved; motor continues to  be impacted by Parkinson's, perseverations, delayed initiation ?Mobility  ?Bed Mobility ?Bed Mobility: Rolling Right;Rolling Left;Supine to Sit;Sit to Supine ?Rolling Right: Minimal Assistance - Patient > 75% ?Rolling Left: Minimal Assistance - Patient > 75% ?Supine to Sit: Minimal Assistance - Patient > 75% ?Sit to Supine: Minimal Assistance - Patient > 75% ?Transfers ?Sit to Stand: Contact Guard/Touching assist;Minimal Assistance - Patient > 75% ?Stand to Sit: Contact Guard/Touching assist;Minimal Assistance - Patient > 75%  ?Trunk/Postural Assessment  ?Cervical Assessment ?Cervical Assessment: Exceptions to [facility] (forward head, limited cervical ROM) ?Thoracic Assessment ?Thoracic Assessment:  (rounded shoulders) ?Lumbar Assessment ?Lumbar Assessment:  (posterior pelvic tilt) ?Postural Control ?Postural Control: Deficits on evaluation ?Righting Reactions: delayed and inadequate ?Protective Responses: delayed and inadequate ?Postural Limitations: Rigid  ?Balance ?Balance ?Balance Assessed: Yes ?Static Sitting Balance ?Static Sitting - Balance Support: Feet supported;No upper extremity supported ?Static Sitting - Level of Assistance: 5: Stand by assistance ?Dynamic Sitting Balance ?Dynamic Sitting - Balance Support: Feet supported;During functional activity ?Dynamic Sitting - Level of Assistance: 4: Min assist ?Static Standing Balance ?Static Standing - Balance Support: Bilateral upper extremity supported ?Static Standing - Level of Assistance: Other (comment) (CGA) ?Dynamic Standing Balance ?Dynamic Standing - Balance Support: During functional activity;Bilateral upper extremity supported ?Dynamic Standing - Level of Assistance: 4: Min assist ?Extremity/Trunk Assessment ?RUE Assessment ?RUE Assessment: Within Functional Limits ?LUE Assessment ?LUE Assessment: Exceptions to [facility] ?General Strength Comments: essential tremor ?LUE Body System: Neuro ?Brunstrum levels for arm and hand: Arm;Hand ?Brunstrum level for arm:  Stage IV Movement is deviating from synergy ?Brunstrum level for hand: Stage V Independence from basic synergies ? ? ?Caryl Asp Rion Schnitzer ?08/17/2021, 12:42 PM ?

## 2021-08-17 NOTE — Progress Notes (Signed)
Physical Therapy Session Note ? ?Patient Details  ?Name: William [name] ?MRN: [id number] ?Date of Birth: 01/18/1941 ? ?Today's Date: 08/17/2021 ?PT Individual Time: 0932-1000 ?PT Individual Time Calculation (min): 28 min  ? 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 ?Skilled Therapeutic Interventions/Progress Updates:  ?   ?Pt received supine in bed and agrees to therapy. No complaint of pain. Supine to sit with cues for logrolling, sequencing, and modA overall to complete. Pt performs sit to stand from EOB with minA and PT proviees assistance to pull up pants and tighten brief. Stand step transfer to [facility] with minA HHA and cues for positioning and sequencing. WC transport to gym for time management. Pt performs sit to stand with CGA and cues for initiation and sequencing. Pt ambulates x175' with RW and constant cues to increase stride length due to Parkinsonian gait pattern, and CGA overall. Pt verbalizes significant fatigue following ambulation. WC transport back to room. Left seated with alarm intact and all needs within reach. ? ?Therapy Documentation ?Precautions:  ?Precautions ?Precautions: Fall ?Precaution Comments: Parkinson's ?Restrictions ?Weight Bearing Restrictions: No ? ? ?Therapy/Group: Individual Therapy ? ?Breck Coons, PT, DPT ?08/17/2021, 9:55 AM  ?

## 2021-08-17 NOTE — Progress Notes (Signed)
Speech Language Pathology Discharge Summary ? ?Patient Details  ?Name: William [name] ?MRN: [id number] ?Date of Birth: 1941/05/04 ? ?Today's Date: 08/17/2021 ?SLP Individual Time: 7858-8502 ?SLP Individual Time Calculation (min): 30 min ? ? ?Skilled Therapeutic Interventions:  Skilled ST services focused on cognitive skills. SLP facilitated reassessment of cognitive linguistic skills utilizing SLUMS, pt scored 14/30 (n=27) with deficits in short term recall, attention, problem solving and error awareness. SLP provided education to continued ST services and need for full supervision A, pt agreed. Pt's wife was not present. Pt was left in room with call bell within reach and chair alarm set. SLP recommends to continue skilled services. ? ? ? ?Patient has met 9 of  (11) long term goals.  Patient to discharge at overall Mod;Min;Supervision;Max level.  ?Reasons goals not met: slower than expected progress and impact of motor perseveration  ? ?Clinical Impression/Discharge Summary:   Pt made good progress meeting 9/11 goals, discharging at supervision A for swallowing, max-mod A speech intelligibility, min-mod A word finding/expressing wants/needs and  mod-min A for cognitive skills. Pt made improvement in swallow function, consuming regular textures and thin liquids with set up and supervision A. Minimal progress in increasing vocal intensity with short carryover to next word or phrase, despite max A multimodal cues including bio-feed back. Pt demonstrated improvement in work finding, basic problem solving, error awareness and short term recall. Pt demonstrated improvement in functional cognitive linguistic assessments scoring 9/30 on SLUMS near evaluation and 14/30 at discharge. Pt's greatest barriers at discharge are motor perseveration impacting functional/verbal participation, followed by reduced vocal intensity, reduced clarity of thought and cognitive deficits. Education was on going with pt's wife and she was present  in most of the ST sessions. Pt benefited from skilled ST services in order to maximize functional independence and reduce burden of care, requiring 24 hour supervision at discharge with continued skilled ST services. ? ?Care Partner:  ?Caregiver Able to Provide Assistance: Yes  ?Type of Caregiver Assistance: Physical;Cognitive ? ?Recommendation:  ?24 hour supervision/assistance;Home Health SLP  ?Rationale for SLP Follow Up: Maximize functional communication;Maximize cognitive function and independence;Maximize swallowing safety;Reduce caregiver burden  ? ?Equipment: N/A  ? ?Reasons for discharge: Discharged from hospital  ? ?Patient/Family Agrees with Progress Made and Goals Achieved: Yes  ? ? ?[name] ?08/17/2021, 12:55 PM ? ?

## 2021-08-17 NOTE — Progress Notes (Signed)
Inpatient Rehabilitation Care Coordinator ?Discharge Note  ? ?Patient Details  ?Name: LUCIAN BASWELL ?MRN: [id number] ?Date of Birth: 12/01/1940 ? ? ?Discharge location: [place] AND PARTICIPATED IN HIS CARE 24/7 ? ?Length of Stay: 19 DAYS ? ?Discharge activity level: MIN-CGA LEVEL ? ?Home/community participation: ACTIVE ? ?Patient response DX:IPJASN Literacy - How often do you need to have someone help you when you read instructions, pamphlets, or other written material from your doctor or pharmacy?: Rarely ? ?Patient response KN:LZJQBH Isolation - How often do you feel lonely or isolated from those around you?: Rarely ? ?Services provided included: MD, RD, PT, OT, SLP, RN, CM, Pharmacy, SW ? ?Financial Services:  ?Charity fundraiser Utilized: Private Insurance ?HE[employer] ADVANTAGE ? ?Choices offered to/list presented to: PT AND WIFE ? ?Follow-up services arranged:  ?Home Health, Patient/Family has no preference for HH/DME agencies, DME ?Home Health Agency: [facility]  PT, OT, [place], AIDE  ?  ?DME : [place] ON OWN ?  ? ?Patient response to transportation need: ?Is the patient able to respond to transportation needs?: Yes ?In the past 12 months, has lack of transportation kept you from medical appointments or from getting medications?: No ?In the past 12 months, has lack of transportation kept you from meetings, work, or from getting things needed for daily living?: No ? ? ? ?Comments (or additional information): WIFE WAS HERE DAILY AND AWARE OF HUSBAND'S CARE NEEDS. PT'S DAUGHTER TO COME FOR THREE WEEKS TO ASSIST WIFE AND PT'S SON MAY COME ALSO TO ASSIST. FMLA PAPERWORK COMPLETED FOR DAUGHTER. [facility] FOR [facility]AL [facility]IST AT [facility] ? ?Patient/Family verbalized understanding of follow-up arrangements:  Yes ? ?Individual responsible for coordination of the follow-up plan:  [name]-WIFE [phone] ? ?Confirmed correct DME delivered: Elease Hashimoto 08/17/2021   ? ?Elease Hashimoto ?

## 2021-08-18 ENCOUNTER — Other Ambulatory Visit (HOSPITAL_COMMUNITY): Payer: Self-pay

## 2021-08-18 ENCOUNTER — Telehealth: Payer: Self-pay

## 2021-08-18 LAB — GLUCOSE, CAPILLARY: Glucose-Capillary: 101 mg/dL — ABNORMAL HIGH (ref 70–99)

## 2021-08-18 MED ORDER — HYDRALAZINE HCL 25 MG PO TABS
25.0000 mg | ORAL_TABLET | Freq: Three times a day (TID) | ORAL | 0 refills | Status: DC
  Filled 2021-08-18: qty 90, 30d supply, fill #0

## 2021-08-18 NOTE — Progress Notes (Signed)
Patient's medications delivered to bedside by pharmacy.  Discharged home today with wife. ?

## 2021-08-18 NOTE — Telephone Encounter (Signed)
Transition Care Management Follow-up Telephone Call   ?  ?Date discharged? 08/18/2021 ?  ?How have you been since you were released from the hospital? [place] has to use a walked has complications from stroke ?  ?Any patient concerns? No concerns ?  ?  ?Items Reviewed: ?Medications reviewed: yes ?Allergies reviewed: yes ?Dietary changes reviewed: no change  ?Referrals reviewed: yes ?  ?  ?Functional Questionnaire:  ?Independent - I ?Dependent - D  ?  ?Activities of Daily Living (ADLs):   ?  ?Personal hygiene - D ?Dressing - D ?Eating - food prep D can  feed his self ?Maintaining continence - D ?Transferring - D ?  ?Independent Activities of Daily Living (iADLs): ?Basic communication skills - d ?Transportation - d ?Meal preparation  - d ?Shopping - d ?Housework - d ?Managing medications - d ?Managing personal finances - d ?  ?Confirmed importance and date/time of follow-up visits scheduled 08/26/2021 ?Provider Appointment booked with Dr Tomi Likens  ?  ?Confirmed with patient if condition begins to worsen call PCP or go to the ER.  Patient was given the office number and encouraged to call back with question or concerns: Yes  ? ?Confirmed with patient if condition begins to worsen call PCP or go to the ER. Pt. Was given the office number and encouraged to call back with questions or concerns.   ?

## 2021-08-19 ENCOUNTER — Ambulatory Visit: Payer: PPO | Admitting: Physical Therapy

## 2021-08-19 DIAGNOSIS — G20 Parkinson's disease: Secondary | ICD-10-CM | POA: Diagnosis not present

## 2021-08-19 DIAGNOSIS — M47816 Spondylosis without myelopathy or radiculopathy, lumbar region: Secondary | ICD-10-CM | POA: Diagnosis not present

## 2021-08-19 DIAGNOSIS — I6381 Other cerebral infarction due to occlusion or stenosis of small artery: Secondary | ICD-10-CM | POA: Diagnosis not present

## 2021-08-19 DIAGNOSIS — S2249XA Multiple fractures of ribs, unspecified side, initial encounter for closed fracture: Secondary | ICD-10-CM | POA: Diagnosis not present

## 2021-08-19 DIAGNOSIS — G9341 Metabolic encephalopathy: Secondary | ICD-10-CM | POA: Diagnosis not present

## 2021-08-19 DIAGNOSIS — N184 Chronic kidney disease, stage 4 (severe): Secondary | ICD-10-CM | POA: Diagnosis not present

## 2021-08-20 DIAGNOSIS — E785 Hyperlipidemia, unspecified: Secondary | ICD-10-CM | POA: Diagnosis not present

## 2021-08-20 DIAGNOSIS — N400 Enlarged prostate without lower urinary tract symptoms: Secondary | ICD-10-CM | POA: Diagnosis not present

## 2021-08-20 DIAGNOSIS — Z87440 Personal history of urinary (tract) infections: Secondary | ICD-10-CM | POA: Diagnosis not present

## 2021-08-20 DIAGNOSIS — M47816 Spondylosis without myelopathy or radiculopathy, lumbar region: Secondary | ICD-10-CM | POA: Diagnosis not present

## 2021-08-20 DIAGNOSIS — Z7982 Long term (current) use of aspirin: Secondary | ICD-10-CM | POA: Diagnosis not present

## 2021-08-20 DIAGNOSIS — Z7902 Long term (current) use of antithrombotics/antiplatelets: Secondary | ICD-10-CM | POA: Diagnosis not present

## 2021-08-20 DIAGNOSIS — M47818 Spondylosis without myelopathy or radiculopathy, sacral and sacrococcygeal region: Secondary | ICD-10-CM | POA: Diagnosis not present

## 2021-08-20 DIAGNOSIS — I69318 Other symptoms and signs involving cognitive functions following cerebral infarction: Secondary | ICD-10-CM | POA: Diagnosis not present

## 2021-08-20 DIAGNOSIS — Z8551 Personal history of malignant neoplasm of bladder: Secondary | ICD-10-CM | POA: Diagnosis not present

## 2021-08-20 DIAGNOSIS — K219 Gastro-esophageal reflux disease without esophagitis: Secondary | ICD-10-CM | POA: Diagnosis not present

## 2021-08-20 DIAGNOSIS — M47817 Spondylosis without myelopathy or radiculopathy, lumbosacral region: Secondary | ICD-10-CM | POA: Diagnosis not present

## 2021-08-20 DIAGNOSIS — K8020 Calculus of gallbladder without cholecystitis without obstruction: Secondary | ICD-10-CM | POA: Diagnosis not present

## 2021-08-20 DIAGNOSIS — N184 Chronic kidney disease, stage 4 (severe): Secondary | ICD-10-CM | POA: Diagnosis not present

## 2021-08-20 DIAGNOSIS — I69351 Hemiplegia and hemiparesis following cerebral infarction affecting right dominant side: Secondary | ICD-10-CM | POA: Diagnosis not present

## 2021-08-20 DIAGNOSIS — I129 Hypertensive chronic kidney disease with stage 1 through stage 4 chronic kidney disease, or unspecified chronic kidney disease: Secondary | ICD-10-CM | POA: Diagnosis not present

## 2021-08-20 DIAGNOSIS — Z87891 Personal history of nicotine dependence: Secondary | ICD-10-CM | POA: Diagnosis not present

## 2021-08-20 DIAGNOSIS — E559 Vitamin D deficiency, unspecified: Secondary | ICD-10-CM | POA: Diagnosis not present

## 2021-08-20 DIAGNOSIS — F0150 Vascular dementia without behavioral disturbance: Secondary | ICD-10-CM | POA: Diagnosis not present

## 2021-08-20 DIAGNOSIS — G4700 Insomnia, unspecified: Secondary | ICD-10-CM | POA: Diagnosis not present

## 2021-08-20 DIAGNOSIS — D631 Anemia in chronic kidney disease: Secondary | ICD-10-CM | POA: Diagnosis not present

## 2021-08-20 DIAGNOSIS — I6523 Occlusion and stenosis of bilateral carotid arteries: Secondary | ICD-10-CM | POA: Diagnosis not present

## 2021-08-20 DIAGNOSIS — R7303 Prediabetes: Secondary | ICD-10-CM | POA: Diagnosis not present

## 2021-08-20 DIAGNOSIS — Z9181 History of falling: Secondary | ICD-10-CM | POA: Diagnosis not present

## 2021-08-21 ENCOUNTER — Encounter: Payer: Self-pay | Admitting: Physical Medicine and Rehabilitation

## 2021-08-21 ENCOUNTER — Other Ambulatory Visit: Payer: Self-pay | Admitting: Physical Medicine and Rehabilitation

## 2021-08-21 ENCOUNTER — Other Ambulatory Visit (HOSPITAL_BASED_OUTPATIENT_CLINIC_OR_DEPARTMENT_OTHER): Payer: Self-pay

## 2021-08-21 ENCOUNTER — Telehealth: Payer: Self-pay | Admitting: *Deleted

## 2021-08-21 MED ORDER — CARBAMAZEPINE 100 MG/5ML PO SUSP: ORAL | 3 refills | Status: DC

## 2021-08-21 MED ORDER — PANTOPRAZOLE SODIUM 40 MG PO PACK: 40.0000 mg | PACK | Freq: Every day | ORAL | 3 refills | Status: DC

## 2021-08-21 NOTE — Telephone Encounter (Signed)
William [name] PT [facility] HH called to report that William [name] is having trouble swallowing the Tegretol. He is asking for a ST eval. It looks like on discharge from hospital PT OT SP and Aide was requested by [place]. I have okd the eval with William [name].   ? ?MD: Please advise about what to do about the tegretol. It is extended release so cannot be crushed. ?

## 2021-08-21 NOTE — Telephone Encounter (Signed)
After speaking with the son who was in the home at the current time, it is not just the tegretol, but all of his medications.  They have tried applesauce and mashed potatoes but he is struggling with all of them. This began last pm and continued today. What should they do? He wants [place] to speak to the wife who is not in the home at the moment. Her mobile number is [phone]. ?

## 2021-08-24 ENCOUNTER — Other Ambulatory Visit (HOSPITAL_COMMUNITY): Payer: Self-pay

## 2021-08-24 ENCOUNTER — Ambulatory Visit: Payer: PPO | Admitting: Physical Therapy

## 2021-08-24 ENCOUNTER — Other Ambulatory Visit (HOSPITAL_BASED_OUTPATIENT_CLINIC_OR_DEPARTMENT_OTHER): Payer: Self-pay

## 2021-08-24 ENCOUNTER — Telehealth: Payer: Self-pay

## 2021-08-24 ENCOUNTER — Telehealth (HOSPITAL_BASED_OUTPATIENT_CLINIC_OR_DEPARTMENT_OTHER): Payer: Self-pay | Admitting: Pharmacist

## 2021-08-24 DIAGNOSIS — S2249XA Multiple fractures of ribs, unspecified side, initial encounter for closed fracture: Secondary | ICD-10-CM | POA: Diagnosis not present

## 2021-08-24 DIAGNOSIS — G9341 Metabolic encephalopathy: Secondary | ICD-10-CM | POA: Diagnosis not present

## 2021-08-24 DIAGNOSIS — I6381 Other cerebral infarction due to occlusion or stenosis of small artery: Secondary | ICD-10-CM | POA: Diagnosis not present

## 2021-08-24 DIAGNOSIS — N184 Chronic kidney disease, stage 4 (severe): Secondary | ICD-10-CM | POA: Diagnosis not present

## 2021-08-24 DIAGNOSIS — M47816 Spondylosis without myelopathy or radiculopathy, lumbar region: Secondary | ICD-10-CM | POA: Diagnosis not present

## 2021-08-24 DIAGNOSIS — G20 Parkinson's disease: Secondary | ICD-10-CM | POA: Diagnosis not present

## 2021-08-24 NOTE — Telephone Encounter (Signed)
Caprice Red, OT from [place] called requesting HHOT 1wk3, 2wk1, 1wk1 for ADL transfers and exercise. Orders approved and given. ?

## 2021-08-24 NOTE — Telephone Encounter (Signed)
Pharmacy Transitions of Care Follow-up Telephone Call ? ?Date of discharge: 08/18/2021  ?Discharge Diagnosis: Basal ganglia infarction ? ?How have you been since you were released from the hospital? Spoke with patient's wife today. She states patient is doing well, regaining his energy. He has kept busy throughout the day doing normal day-to-day tasks. She does states he starts to feel tired, antsy, and a bit nervous around the afternoon time. I advised to speak with the overseeing provider at next appointment (September 17, 2021).   ?  ? ?Medication Accessibility: ? ?Home Pharmacy: [facility]   ? ?Was the patient provided with refills on discharged medications? Yes  ? ?Have all prescriptions been transferred from [facility] to home pharmacy? Yes  ?  ? ?Final Patient Assessment: ?-Pt is doing well.  ?-Verbalized understanding of rx transfers and to contact doctor if previously stated afternoon symptoms worsen.  ?-Pt has post discharge appointment and refill sent to [facility]. ? ?Remer Macho, PharmD ?Clinical Pharmacist ?Engineer, production at [employer] ?Pharmacy: [phone]  ?08/24/2021 1:43 PM ?   ?

## 2021-08-25 ENCOUNTER — Telehealth: Payer: Self-pay | Admitting: Family Medicine

## 2021-08-25 NOTE — Telephone Encounter (Signed)
Levada Dy calling from [facility] regarding pt's meds  ? ?Level 2 meds interaction with Tramadol and Carbamazepine  ? ? ?Please contact [place] - [phone] ?

## 2021-08-25 NOTE — Telephone Encounter (Signed)
Please advise 

## 2021-08-26 ENCOUNTER — Encounter: Payer: Self-pay | Admitting: Physical Medicine and Rehabilitation

## 2021-08-26 ENCOUNTER — Ambulatory Visit: Payer: PPO | Admitting: Physical Therapy

## 2021-08-26 ENCOUNTER — Ambulatory Visit: Payer: PPO | Admitting: Neurology

## 2021-08-26 NOTE — Progress Notes (Addendum)
? ?NEUROLOGY FOLLOW UP OFFICE NOTE ? ?William [name] ?[id number] ? ?Assessment/Plan:  ? ?1.   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   Major vascular neurocognitive disorder, progressed since recent stroke ?3.  Intracranial stenosis ?4.  Hypertension ?5.  Hyperlipidiemia ? ? ?Vascular major neurocognitive disorder ?3.  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  Lumbar spondylosis ?5.  Hypertension ?6.  Hyperlipidemia ?7.  History of right CEA ?  ?1.  Secondary stroke prevention ?-  Continue ASA and Plavix for remainder of 3 months, then Plavix '75mg'$  daily alone ?- Blood pressure control ?- Glycemic control.  Hgb A1c goal less than 7 ?-  LDL goal less than 70.  Unable to tolerate statin.   ?2.  Continue home health services.  If no significant improvement over next few months, consider palliative care to assist with management of his chronic comorbidities ?4.  Encourage walking, mental stimulation and socialization ?5. As he continues to be confused and has previously had trouble with recurrent UTIs, will check UA and culture.  ?6. Follow up 4 months. ?  ?Subjective:  ?William [name] is a 81 year old man with dementia, CKD, HTN, CAD, HLD and history of CVA who follows up for transition of care visit due to recent hospitalization for stroke.  He is accompanied by his wife (and son via phone) who supplements history. CT and MRI from hospitalization personally reviewed. ?  ?UPDATE: ?Current medications:  ASA '81mg'$ , Plavix '75mg'$ ; Norvasc, hydralazine, fenofibrate '160mg'$ , Tegretol-XR '100mg'$  at bedtime; amantadine, lorazepam PRN; tramadol '50mg'$  Q8h PRN ? ?Presented to ED on 07/22/2021 with fall and confusion.  MRI of brain revealed no acute findings.  Found to have a UTI and discharged on Keflex.  Continued to act confused.  On 07/27/2021, he was lethargic and collapsed after vomiting  Admitted to [facility] on 07/27/2021 for stroke.  CT head showed acute infarct in the left basal ganglia and corona radiata as  well as remote right MCA infarcts, confirmed on follow up brain MRI.  MRA head showed severe distal left M1 MCA stenosis and moderate right ICA stenosis.  Carotid ultrasound showed 1-39% stenosis of right ICA and no stenosis of left ICA.  2D echocardiogram showed LVEF 60-65% with mild LVH with mild grade 1 diastolic dysfunction.  EEG showed generalized slowing but no epileptiform discharges or electrographic seizures.  LDL was 115.  Hgb A1c was 5.3.  He was discharged on ASA and Plavix for 3 months, followed by Plavix alone.  Discharged from inpatient rehab on 08/17/2021.  Currently at home.  Receives home health services.  He is much more confused.  He mistakes his wife for his ex-wife.  He is incontinent.   His family tries to have him walk everyday.  Appetite is still good.  Hemineglect seems to be worse.   ?  ?HISTORY: ?1.  Lumbar spondylosis with radiculopathy ?Prior to [place], he was going to the gym and seeing a personal trainer but it stopped once the pandemic hit.  Since the pandemic, he has been sedentary.  He walks the dog once or twice a week.  He started having bilateral leg pain later that year in 2020.  No associated back pain.  No shooting pain.  It is described as an aching in his calves.  No associated numbness.  He also reports weakness.  When he walks, he has trouble picking up his legs.  He seems to shuffle his feet.  When he gets  up to stand, he feels like he can fall forward.  He had one fall while standing at the toilet and he bent forward to spit in toilet.  No change in bowel habit.  He was having urinary issues so he was taken off of Flomax.    Vascular US ABI from 12/08/18 showed abnormal toe-brachial index in both lower extremities but otherwise unremarkable.  MRI of cervical spine without contrast from 03/10/2019 showed some cervical degenerative disc disease and spondylosis with mild foraminal narrowing but no spinal stenosis or compressive myelopathy.  He does have history of low back pain  and lumbar spondylosis.  Due to ongoing unsteady gait with bilateral leg pain, MRI of lumbar spine was performed on 11/14/2019, which was personally reviewed and demonstrated degenerative changes stable compared to 2012 with L3-4 right subarticular stenosis.  He has had mixed response to epidural injections. ?  ?  ?2.  Vascular Dementia: ?He had a stroke in 2006, after experiencing left facial droop, left arm and leg weakness, as well slurred vision and vision problems.  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  He has some residual left sided weakness and facial weakness.  He underwent left carotid endarterectomy in 2010 for asymptomatic stenosis. ?  ?Beginning around 2013, he and his wife have noted a gradual progression and cognitive deficits. Onset correlated during a period when he was experiencing dizziness and syncope secondary to orthostatic hypotension. At that time, he was dehydrated and not drinking much fluids. He was instructed to start increasing his fluid intake as well as his sodium intake. Now, he tries to drink 3 bottles of water per day. He particularly notes confusion regarding how to perform certain everyday tasks. For example, it takes him a long time to unload the dishwasher because he has trouble figuring out where to put the dishes. He use to enjoy cooking and preparing meals. Now he has difficulty cooking and can only see things up in the microwave. He also has been experiencing dressing apraxia. Sometimes he will put both his feet into one leg of his pants. Other times, he has difficulty buttoning his shirts. He denies any language dysfunction such as difficulty understanding other people or other people not understanding him. He has no trouble reading or writing. His short-term and long-term memory are pretty good. He denies any difficulty with face recognition. He denies hallucinations or delusions. He has not had any change in his personality or  behavior. He still drives but very seldom and only locally. He has not had any problems with accidents or near accidents, but he says he drives slowly because he is very nervous and cautious. He does have history of anxiety and often shakes when he gets nervous. He denies any family history of dementia. However, there is a psychiatric history with his mother and sister.  He denies history of alcohol abuse or drug use. ?  ?He underwent neuropsychological testing on 10/25/13 with Dr. Macarthur Critchley at [facility] suggested mild dementia due to his stroke, but also underlying neurodegenerative process.  Memory and left hemispheric medial temporal functions were intact.  Findings showed deficits in visual processing and executive dysfunction.  Onset of visual-spatial deficits and apraxia may suggest occipital lobe involvement, such as due to an Alzheimer?s variant called posterior cortical atrophy.  MRI from May 2015 revealed global atrophy but it does not seem more prominent in the occipital lobes.  ?He developed increased confusion in February 2021.  At physical therapy, he was noted to have increased difficulty using his left side (affected side from his stroke).  He had difficulty performing finger-thumb tapping and had trouble gripping the handle of the exercise machine with his left hand.  He also has had acute cognitive changes.  He started having trouble dialing his daughter's phone number which is new.  MRI of brain without contrast performed on 08/15/2019 was personally reviewed and showed remote large right hemispheric stroke and chronic small vessel ischemic changes, stable compared to prior MRI from 2015, no acute intracranial abnormality.   He underwent repeat neuropsychological testing on 11/29/2019 which demonstrated findings primarily consistent with major vascular neurocognitive disorder, relatively stable compared to prior testing in 2015.  Underlying neurodegenerative disorder could not be ruled  out but clinically has been stable.   ?  ?Prior medication:  Aricept (diarrhea, vivid dreams); galantamine (nausea) ? ?3.  Headache: ?In February 2022, he reported new moderate non-throbbing headache back

## 2021-08-27 ENCOUNTER — Ambulatory Visit: Payer: PPO | Admitting: Neurology

## 2021-08-27 ENCOUNTER — Encounter: Payer: PPO | Attending: Physical Medicine and Rehabilitation | Admitting: Physical Medicine and Rehabilitation

## 2021-08-27 ENCOUNTER — Other Ambulatory Visit: Payer: PPO

## 2021-08-27 ENCOUNTER — Other Ambulatory Visit: Payer: Self-pay

## 2021-08-27 ENCOUNTER — Encounter: Payer: Self-pay | Admitting: Neurology

## 2021-08-27 VITALS — BP 145/68 | HR 69 | Ht 71.0 in | Wt 206.0 lb

## 2021-08-27 DIAGNOSIS — I69318 Other symptoms and signs involving cognitive functions following cerebral infarction: Secondary | ICD-10-CM | POA: Diagnosis not present

## 2021-08-27 DIAGNOSIS — R7303 Prediabetes: Secondary | ICD-10-CM | POA: Diagnosis not present

## 2021-08-27 DIAGNOSIS — G4700 Insomnia, unspecified: Secondary | ICD-10-CM | POA: Diagnosis not present

## 2021-08-27 DIAGNOSIS — Z7982 Long term (current) use of aspirin: Secondary | ICD-10-CM | POA: Diagnosis not present

## 2021-08-27 DIAGNOSIS — F411 Generalized anxiety disorder: Secondary | ICD-10-CM | POA: Diagnosis not present

## 2021-08-27 DIAGNOSIS — M47816 Spondylosis without myelopathy or radiculopathy, lumbar region: Secondary | ICD-10-CM | POA: Diagnosis not present

## 2021-08-27 DIAGNOSIS — M47818 Spondylosis without myelopathy or radiculopathy, sacral and sacrococcygeal region: Secondary | ICD-10-CM | POA: Diagnosis not present

## 2021-08-27 DIAGNOSIS — Z8673 Personal history of transient ischemic attack (TIA), and cerebral infarction without residual deficits: Secondary | ICD-10-CM

## 2021-08-27 DIAGNOSIS — Z8551 Personal history of malignant neoplasm of bladder: Secondary | ICD-10-CM | POA: Diagnosis not present

## 2021-08-27 DIAGNOSIS — Z7902 Long term (current) use of antithrombotics/antiplatelets: Secondary | ICD-10-CM | POA: Diagnosis not present

## 2021-08-27 DIAGNOSIS — E785 Hyperlipidemia, unspecified: Secondary | ICD-10-CM

## 2021-08-27 DIAGNOSIS — F0150 Vascular dementia without behavioral disturbance: Secondary | ICD-10-CM | POA: Diagnosis not present

## 2021-08-27 DIAGNOSIS — N184 Chronic kidney disease, stage 4 (severe): Secondary | ICD-10-CM | POA: Diagnosis not present

## 2021-08-27 DIAGNOSIS — I129 Hypertensive chronic kidney disease with stage 1 through stage 4 chronic kidney disease, or unspecified chronic kidney disease: Secondary | ICD-10-CM | POA: Diagnosis not present

## 2021-08-27 DIAGNOSIS — M47817 Spondylosis without myelopathy or radiculopathy, lumbosacral region: Secondary | ICD-10-CM | POA: Diagnosis not present

## 2021-08-27 DIAGNOSIS — K219 Gastro-esophageal reflux disease without esophagitis: Secondary | ICD-10-CM | POA: Diagnosis not present

## 2021-08-27 DIAGNOSIS — D631 Anemia in chronic kidney disease: Secondary | ICD-10-CM | POA: Diagnosis not present

## 2021-08-27 DIAGNOSIS — K8020 Calculus of gallbladder without cholecystitis without obstruction: Secondary | ICD-10-CM | POA: Diagnosis not present

## 2021-08-27 DIAGNOSIS — F0390 Unspecified dementia without behavioral disturbance: Secondary | ICD-10-CM | POA: Diagnosis not present

## 2021-08-27 DIAGNOSIS — Z9181 History of falling: Secondary | ICD-10-CM | POA: Diagnosis not present

## 2021-08-27 DIAGNOSIS — Z87440 Personal history of urinary (tract) infections: Secondary | ICD-10-CM | POA: Diagnosis not present

## 2021-08-27 DIAGNOSIS — Z87891 Personal history of nicotine dependence: Secondary | ICD-10-CM | POA: Diagnosis not present

## 2021-08-27 DIAGNOSIS — I10 Essential (primary) hypertension: Secondary | ICD-10-CM | POA: Diagnosis not present

## 2021-08-27 DIAGNOSIS — N400 Enlarged prostate without lower urinary tract symptoms: Secondary | ICD-10-CM | POA: Diagnosis not present

## 2021-08-27 DIAGNOSIS — E559 Vitamin D deficiency, unspecified: Secondary | ICD-10-CM | POA: Diagnosis not present

## 2021-08-27 DIAGNOSIS — I6523 Occlusion and stenosis of bilateral carotid arteries: Secondary | ICD-10-CM | POA: Diagnosis not present

## 2021-08-27 DIAGNOSIS — I69351 Hemiplegia and hemiparesis following cerebral infarction affecting right dominant side: Secondary | ICD-10-CM | POA: Diagnosis not present

## 2021-08-27 DIAGNOSIS — I639 Cerebral infarction, unspecified: Secondary | ICD-10-CM | POA: Diagnosis not present

## 2021-08-27 NOTE — Telephone Encounter (Signed)
Pt's wife made aware. 

## 2021-08-27 NOTE — Patient Instructions (Signed)
Continue aspirin and plavix for the rest of the 3 months, then plavix alone ?Check UA and urine culture ?Follow up 4 months. ?

## 2021-08-27 NOTE — Progress Notes (Signed)
? ?  Subjective:  ? ? Patient ID: William [name], male    DOB: 13-Apr-1941, 81 y.o.   MRN: [id number] ? ?HPI ?An audio/video tele-health visit is felt to be the most appropriate encounter for this patient at this time. This is a follow up tele-visit via phone. The patient is at home. MD is at office. Prior to scheduling this appointment, our staff discussed the limitations of evaluation and management by telemedicine and the availability of in-person appointments. The patient expressed understanding and agreed to proceed.  ? ?1) Anxiety ?William [name] is a 81 year old man who was recently admitted to [facility] with CVA, His wife calls regarding anxiety he has been experiencing between 4:30 and 5pm. At this time he tries to get up and walk around and seems agitated. She has been giving him Xanax '1mg'$  at night and no other anxiety medications ? ?2) CVA ?-he has otherwise been doing really well in his recovery and has been walking well at home! ?-his son visited this week and this seemed to help his recovery ? ? ? ?Review of Systems ?+anxiety ?   ?Objective:  ? Physical Exam ? ?Not performed as patient was seen via phone ?   ?Assessment & Plan:  ?William [name] is a 81 year old man recently admitted to [facility] with CVA who presents for follow-up regarding anxiety ? ?1) Anxiety ?-recommended splitting Xanax in half and taking 0.'5mg'$  when he is anxious around 4:30pm and the remaining 0.'5mg'$  at night as he has been ?-recommended eating 4 [place] nuts per day. Discussed that these contain selenium which are also good for hair and nail growth ?-recommended deep breathing, doing activities that relax him during this period of anxiety ? ?5 minutes spent in discussion of his anxiety, recommending breaking Xanax '1mg'$  in half and taking half at 4:30 and 1/2 and night ? ?

## 2021-08-28 ENCOUNTER — Encounter: Payer: Self-pay | Admitting: Family Medicine

## 2021-08-28 NOTE — Addendum Note (Signed)
Addended byTomi Likens, Aylssa Herrig R on: 08/28/2021 07:01 AM ? ? Modules accepted: Level of Service ? ?

## 2021-08-31 ENCOUNTER — Ambulatory Visit: Payer: PPO | Admitting: Physical Therapy

## 2021-08-31 ENCOUNTER — Encounter: Payer: Self-pay | Admitting: Neurology

## 2021-09-02 ENCOUNTER — Other Ambulatory Visit: Payer: Self-pay

## 2021-09-02 ENCOUNTER — Other Ambulatory Visit (HOSPITAL_BASED_OUTPATIENT_CLINIC_OR_DEPARTMENT_OTHER): Payer: Self-pay

## 2021-09-02 ENCOUNTER — Telehealth (HOSPITAL_BASED_OUTPATIENT_CLINIC_OR_DEPARTMENT_OTHER): Payer: PPO | Admitting: Psychiatry

## 2021-09-02 ENCOUNTER — Ambulatory Visit: Payer: PPO | Admitting: Physical Therapy

## 2021-09-02 DIAGNOSIS — F0630 Mood disorder due to known physiological condition, unspecified: Secondary | ICD-10-CM

## 2021-09-02 MED ORDER — CARBAMAZEPINE 100 MG/5ML PO SUSP
ORAL | 3 refills | Status: DC
  Filled 2021-09-02: qty 450, 30d supply, fill #0

## 2021-09-02 MED ORDER — LORAZEPAM 0.5 MG PO TABS
ORAL_TABLET | ORAL | 1 refills | Status: DC
  Filled 2021-09-02: qty 135, 90d supply, fill #0
  Filled 2021-09-14: qty 135, fill #0

## 2021-09-02 MED ORDER — TRAZODONE HCL 150 MG PO TABS
75.0000 mg | ORAL_TABLET | Freq: Every day | ORAL | 0 refills | Status: DC
  Filled 2021-09-02 – 2021-10-12 (×2): qty 30, 60d supply, fill #0

## 2021-09-02 NOTE — Progress Notes (Addendum)
Patient ID: William [name], male   DOB: 06-23-1940, 81 y.o.   MRN: [id number] [facility] MD Progress Note  09/02/2021 4:02 PM TIMMEY LAMBA  MRN:  [id number] Subjective:  Feeling good Principal Problem: Mild neurocognitive disorde  Today patient is doing fairly well.  Unfortunately he had another CVA.  This apparently was a left CVA and had a marginal effect improves his right but it also affected the left side of his body as well.  Overall though his immediate mood seems to be fairly stable.  He is not agitated.  He shows no signs of irritability.  He denies being depressed.  While he was in the hospital they also reduce his Ativan to taking 0.25 every morning and 0.5 mg at night.  He apparently had some problems with stability.  His Tegretol remains stable.  His Lunesta stopped working and they replaced it with 75 mg of trazodone.  Today we given the warning about getting up slowly on trazodone.  Fortunately he is actually sleeping and eating well.  He seems to be getting along fairly well.  [name] says that he is fairly stable.  He seems to be adjusting fairly well.  He is having problems with a lot of incontinence.  He will be some question if he will be able to return to see me or whether we will do this by phone from this point on out. 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   Mild, related to stroke  history   Nocturia    Renal insufficiency 06/25/2013   S/P carotid endarterectomy    BILATERAL ICA--  PATENT PER DUPLEX  05-19-2012   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   Right calf   Urinary frequency    Vitamin D deficiency     Past Surgical History:  Procedure Laterality Date   APPENDECTOMY  AS CHILD   CARDIOVASCULAR STRESS TEST  03-27-2012  DR CRENSHAW   LOW RISK LEXISCAN STUDY-- PROBABLE NORMAL PERFUSION AND SOFT TISSUE ATTENUATION/  NO ISCHEMIA/ EF 51%   CAROTID ENDARTERECTOMY Bilateral LEFT  11-12-2008  DR GREG HAYES   RIGHT ICA  2006  ([facility])   CYSTOSCOPY W/ RETROGRADES Bilateral 06/22/2021   Procedure: CYSTOSCOPY WITH RETROGRADE PYELOGRAM;  Surgeon: Franchot Gallo, MD;  Location: [facility];  Service: Urology;  Laterality: Bilateral;   CYSTOSCOPY WITH LITHOLAPAXY N/A 02/26/2013   Procedure: CYSTOSCOPY WITH LITHOLAPAXY;  Surgeon: Franchot Gallo, MD;  Location: [facility];  Service: Urology;  Laterality: N/A;   EYE SURGERY  Jan. 2016   cataract surgery both eyes   INGUINAL HERNIA REPAIR Right 11-08-2006   IR KYPHO EA ADDL LEVEL THORACIC OR LUMBAR  02/12/2021   IR RADIOLOGIST EVAL & MGMT  02/18/2021   MASS  EXCISION N/A 03/03/2016   Procedure: EXCISION OF BACK  MASS;  Surgeon: Stark Klein, MD;  Location: [place];  Service: General;  Laterality: N/A;   MOHS SURGERY Left 1/ 2016   Dr Nevada Crane-- Basal cell   PROSTATE SURGERY     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-C N/A 06/22/2021   Procedure: TRANSURETHRAL RESECTION OF BLADDER TUMOR;  Surgeon: Franchot Gallo, MD;  Location[facility];  Service: Urology;  Laterality: N/A;   TRANSURETHRAL RESECTION OF PROSTATE N/A 02/26/2013   Procedure: TRANSURETHRAL RESECTION OF THE PROSTATE WITH GYRUS INSTRUMENTS;  Surgeon: Franchot Gallo, MD;  Location: [facility];  Service: Urology;  Laterality:  N/A;   TRANSURETHRAL RESECTION OF PROSTATE N/A 06/22/2021   Procedure: TRANSURETHRAL RESECTION OF THE PROSTATE (TURP);  Surgeon: Franchot Gallo, MD;  Location: [facility];  Service: Urology;  Laterality: N/A;   Family History:  Family History  Problem Relation Age of Onset   Heart disease Mother        CHF   Bipolar disorder Mother    Heart disease Father        CHF   Family Psychiatric  History:  Social History:  Social History   Substance and Sexual Activity  Alcohol Use Yes   Alcohol/week: 0.0 standard drinks   Comment: Occasional     Social History   Substance and Sexual Activity  Drug Use No    Social History   Socioeconomic History   Marital status: Married    Spouse name: Not on file   Number of children: 2   Years of education: 12   Highest education level: High school graduate  Occupational History    Employer: Retired  Tobacco Use   Smoking status: Former    Packs/day: 2.00    Years: 40.00    Pack years: 80.00    Types: Cigarettes    Quit date: 02/15/2005    Years since quitting: 16.5   Smokeless tobacco: Never  Vaping Use   Vaping Use: Never used  Substance and Sexual Activity   Alcohol use: Yes    Alcohol/week: 0.0 standard drinks    Comment: Occasional   Drug use: No   Sexual activity: Yes    Partners: Female  Other Topics Concern   Not on file  Social History Narrative   Exercise--  Walks dogs everyday      LIves with wife , no stairs in home, caffeine - one cup coffee day, exercise - not much, Right handed, 12th grade, retired      One story home   Social Determinants of Health   Financial Resource Strain: Not on file  Food Insecurity: Not on file  Transportation Needs: Not on file  Physical Activity: Not on file  Stress: Not on file  Social Connections: Not on file   Additional Social History:                         Sleep: Good  Appetite:  Good  Current Medications: Current Outpatient  Medications  Medication Sig Dispense Refill   acetaminophen (TYLENOL) 325 MG tablet Take 1-2 tablets (325-650 mg total) by mouth every 4 (four) hours as needed for mild pain.     amantadine (SYMMETREL) 100 MG capsule Take 1 capsule (100 mg total) by mouth daily. 30 capsule 0   amLODipine (NORVASC) 10 MG tablet Take 1 tablet (10 mg total) by mouth every morning. 30 tablet  0   aspirin 81 MG EC tablet Take 1 tablet (81 mg total) by mouth daily. Swallow whole. 30 tablet 11   carBAMazepine (TEGRETOL) 100 MG/5ML suspension '100mg'$  (1ms) in the morning and '200mg'$  (124m) at night. 450 mL 3   Cholecalciferol (VITAMIN D) 50 MCG (2000 UT) CAPS Take 2,000 Units by mouth every morning.     clopidogrel (PLAVIX) 75 MG tablet Take 1 tablet (75 mg total) by mouth daily. 30 tablet 0   docusate sodium (COLACE) 100 MG capsule Take 1 capsule (100 mg total) by mouth daily. 10 capsule 0   fenofibrate 160 MG tablet Take 1 tablet (160 mg total) by mouth at bedtime. 30 tablet 0   Flaxseed, Linseed, (FLAXSEED OIL) 1200 MG CAPS Take 1,200 mg by mouth 2 (two) times daily.     fluticasone (CUTIVATE) 0.4.19 cream 1 application daily as needed (psoriasis).  3   hydrALAZINE (APRESOLINE) 25 MG tablet Take 1 tablet (25 mg total) by mouth every 8 (eight) hours. 90 tablet 0   lidocaine (LIDODERM) 5 % Place 1 patch onto the skin daily. Remove & Discard patch within 12 hours or as directed by MD 15 patch 1   LORazepam (ATIVAN) 0.5 MG tablet Take 1/2 tablet (0.25 mg total) by mouth daily. 30 tablet 0   LORazepam (ATIVAN) 0.5 MG tablet Half qam   1  qhs 135 tablet 1   Multiple Vitamins-Minerals (ADULT ONE DAILY GUMMIES) CHEW Chew 1 tablet by mouth every morning.     pantoprazole sodium (PROTONIX) 40 mg Place 40 mg into feeding tube daily. 30 packet 3   tamsulosin (FLOMAX) 0.4 MG CAPS capsule Take 1 capsule (0.4 mg total) by mouth daily after supper. 30 capsule 0   traMADol (ULTRAM) 50 MG tablet Take 1 tablet (50 mg total) by mouth every  12 (twelve) hours as needed (back pain). 30 tablet    traZODone (DESYREL) 150 MG tablet Take 1/2 tablet (75 mg total) by mouth at bedtime. 30 tablet 0   triamcinolone cream (KENALOG) 0.1 % Apply 1 application topically 2 (two) times daily. (Patient taking differently: Apply 1 application. topically 2 (two) times daily as needed (psoriasis).) 30 g 0   No current facility-administered medications for this visit.   Facility-Administered Medications Ordered in Other Visits  Medication Dose Route Frequency Provider Last Rate Last Admin   gemcitabine (GEMZAR) chemo syringe for bladder instillation 2,000 mg  2,000 mg Bladder Instillation Once DaFranchot GalloMD        Lab Results:  No results found. However, due to the size of the patient record, not all encounters were searched. Please check Results Review for a complete set of results.  Physical Findings: AIMS:  , ,  ,  ,    CIWA:    COWS:     Musculoskeletal: Strength & Muscle Tone: within normal limits Gait & Station: normal Patient leans: Right  Psychiatric Specialty Exam: ROS  There were no vitals taken for this visit.There is no height or weight on file to calculate BMI.  General Appearance: Casual  Eye Contact::  Good  Speech:  Clear and Coherent  Volume:  Normal  Mood:  Dysphoric and Euthymic  Affect:  Appropriate  Thought Process:  Coherent  Orientation:  Full (Time, Place, and Person)  Thought Content:  WDL  Suicidal Thoughts:  No  Homicidal Thoughts:  No  Memory:  NA  Judgement:  Good  Insight:  Fair  Psychomotor Activity:  Normal  Concentration:  Good  Recall:  Roel Cluck of Knowledge:Good  Language: Good  Akathisia:  No  Handed:  Right  AIMS (if indicated):     Assets:  Desire for Improvement  ADL's:  Intact  Cognition: WNL  Sleep:      Treatment Plan Summary: 09/02/2021, 4:02 PM   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  This is characterized by irritability that is well  controlled on Tegretol 200 mg 1 in the morning and 2 at night.  Is also controlled with a small dose of Ativan taking 0.5 mg pill taking half in the morning and 1 at night.  His second problem is insomnia.  He will continue taking trazodone 75 mg for this condition.  Overall the patient emotionally is fairly stable.  She will return to see me in 3 months ideally in person.  He drinks no alcohol and uses no drugs. Spent  30 minutes  by phone patient at home  I called from the center

## 2021-09-03 LAB — MICROSCOPIC EXAMINATION
Bacteria, UA: NONE SEEN
Casts: NONE SEEN /lpf
RBC, Urine: NONE SEEN /hpf (ref 0–2)
WBC, UA: >30 /hpf — AB (ref 0–5)

## 2021-09-03 LAB — URINE CULTURE, COMPREHENSIVE

## 2021-09-03 LAB — UA/M W/RFLX CULTURE, COMP
Bilirubin, UA: NEGATIVE
Glucose, UA: NEGATIVE
Ketones, UA: NEGATIVE
Nitrite, UA: NEGATIVE
RBC, UA: NEGATIVE
Specific Gravity, UA: 1.012 (ref 1.005–1.030)
Urobilinogen, Ur: 0.2 mg/dL (ref 0.2–1.0)
pH, UA: 6 (ref 5.0–7.5)

## 2021-09-04 ENCOUNTER — Other Ambulatory Visit (HOSPITAL_BASED_OUTPATIENT_CLINIC_OR_DEPARTMENT_OTHER): Payer: Self-pay

## 2021-09-04 MED ORDER — CIPROFLOXACIN HCL 500 MG PO TABS
500.0000 mg | ORAL_TABLET | Freq: Two times a day (BID) | ORAL | 0 refills | Status: AC
  Filled 2021-09-04: qty 20, 10d supply, fill #0

## 2021-09-07 NOTE — Patient Outreach (Signed)
[facility]) Care Management ? ?09/07/2021 ? ?William [name] ?January 12, 1941 ?[id number] ? ? ?Received faxed referral from request that patient's daughter made. Patient has [facility]. Following their workflow I have sent this referral to their Care Management group through email for Care Management.  ? ?[name] ?[place] Management Assistant ?[phone] ? ? ?

## 2021-09-08 ENCOUNTER — Ambulatory Visit (INDEPENDENT_AMBULATORY_CARE_PROVIDER_SITE_OTHER): Payer: PPO | Admitting: Family Medicine

## 2021-09-08 ENCOUNTER — Encounter: Payer: Self-pay | Admitting: Family Medicine

## 2021-09-08 VITALS — BP 142/80 | HR 69 | Temp 97.8°F | Resp 18 | Ht 71.0 in

## 2021-09-08 DIAGNOSIS — I6381 Other cerebral infarction due to occlusion or stenosis of small artery: Secondary | ICD-10-CM

## 2021-09-08 DIAGNOSIS — I69359 Hemiplegia and hemiparesis following cerebral infarction affecting unspecified side: Secondary | ICD-10-CM

## 2021-09-08 DIAGNOSIS — Z87440 Personal history of urinary (tract) infections: Secondary | ICD-10-CM

## 2021-09-08 MED ORDER — NONFORMULARY OR COMPOUNDED ITEM: 0 refills | Status: DC

## 2021-09-08 NOTE — Progress Notes (Addendum)
? ?Subjective:  ? ?By signing my name below, I, [name], attest that this documentation has been prepared under the direction and in the presence of Ann Held, DO  09/08/2021 ? ? ? Patient ID: William [name], male    DOB: [birth year], 81 y.o.   MRN: [id number] ? ?Chief Complaint  ?Patient presents with  ? Hospitalization Follow-up  ?  CVA  ? ? ?HPI ?Patient is in today for a hospital follow up. His wife is present during this visit.  ? ?He was admitted to the hospital on 07/27/2021 for a cerebrovascular accident. He was discharged on 07/29/2021. His wife reports it occurred in his left side and his right side body was affected the most from this incident. His wife reports he has lost a lot of cognitive function following this accident. She notes he is more child like in his behavior.  ? ?His wife reports he is attending physical therapy, speech therapy, and occupational therapy. She notes the speech therapist has not had an recent session due to becoming sick. He can walking with the assistance of a stick and with someone holding his gait belt at the same time.  ?His wife reports other than physical therapy, there is no home health services and she is requesting for some assistance while at home.  ?He and is wife are requesting to sign a DNR form.  ?His wife is requesting a prescription for a lift recliner. She reports he has difficulties getting up from his chair.  ?His wife reports he is developing frequent UTI infections. He is currently on antibiotics to manage his current UTI. He continues seeing his urologist regularly to manage his issues.  ?She also reports having urinary incontinence at night. ?His wife reports he has difficulties swallowing his cholesterol medication following his stroke. He has not taken any in the past couple of days.  ?She has not contacted the in office pharmacist about social services.  ?His wife is interested in seeing palliative care specialist. ? ? ?Past Medical  History:  ?Diagnosis Date  ? Arthritis   ? low back  ? 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 Mohs surgery jan 2016   ? Bladder stone   ? BPH (benign prostatic hyperplasia) 08/06/2007  ? Carotid artery occlusion   ? Chronic kidney disease 2014  ? Stage III  ? Closed fracture of fifth metacarpal bone 05/15/2015  ? Eczema   ? Fasting hyperglycemia 12/21/2006  ? GERD (gastroesophageal reflux disease)   ? History of carotid artery stenosis   ? S/P BILATERAL CEA  ? 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 HTN (hypertension) 07/19/2015  ? Hyperlipidemia   ? Hypertension   ? 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 Mild, related to stroke history  ? Nocturia   ? Renal insufficiency 06/25/2013  ? S/P carotid endarterectomy   ? BILATERAL ICA--  PATENT PER DUPLEX  05-19-2012  ? 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 Right calf  ? Urinary frequency   ? Vitamin D deficiency   ? ? ?Past Surgical History:  ?Procedure Laterality Date  ? APPENDECTOMY  AS CHILD  ? CARDIOVASCULAR STRESS TEST  03-27-2012  DR CRENSHAW  ? LOW RISK LEXISCAN STUDY-- PROBABLE NORMAL PERFUSION AND SOFT TISSUE ATTENUATION/  NO ISCHEMIA/ EF 51%  ? CAROTID ENDARTERECTOMY Bilateral LEFT  11-12-2008  DR GREG HAYES  ? RIGHT ICA  2006  ([facility])  ? CYSTOSCOPY W/  RETROGRADES Bilateral 06/22/2021  ? Procedure: CYSTOSCOPY WITH RETROGRADE PYELOGRAM;  Surgeon: Franchot Gallo, MD;  Location: [facility];  Service: Urology;  Laterality: Bilateral;  ? CYSTOSCOPY WITH LITHOLAPAXY N/A 02/26/2013  ? Procedure: CYSTOSCOPY WITH LITHOLAPAXY;  Surgeon: Franchot Gallo, MD;  Location: [facility];  Service: Urology;  Laterality: N/A;  ? EYE SURGERY  Jan. 2016  ? cataract surgery both eyes  ? INGUINAL HERNIA REPAIR Right 11-08-2006  ? IR KYPHO EA ADDL LEVEL THORACIC OR LUMBAR  02/12/2021  ? IR RADIOLOGIST EVAL & MGMT  02/18/2021  ? MASS EXCISION N/A 03/03/2016  ? Procedure: EXCISION OF BACK  MASS;   Surgeon: Stark Klein, MD;  Location: [facility];  Service: General;  Laterality: N/A;  ? MOHS SURGERY Left 1/ 2016  ? Dr Nevada Crane-- Basal cell  ? PROSTATE SURGERY    ? 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-C N/A 06/22/2021  ? Procedure: TRANSURETHRAL RESECTION OF BLADDER TUMOR;  Surgeon: Franchot Gallo, MD;  Location: [facility];  Service: Urology;  Laterality: N/A;  ? TRANSURETHRAL RESECTION OF PROSTATE N/A 02/26/2013  ? Procedure: TRANSURETHRAL RESECTION OF THE PROSTATE WITH GYRUS INSTRUMENTS;  Surgeon: Franchot Gallo, MD;  Location: [facility];  Service: Urology;  Laterality: N/A;  ? TRANSURETHRAL RESECTION OF PROSTATE N/A 06/22/2021  ? Procedure: TRANSURETHRAL RESECTION OF THE PROSTATE (TURP);  Surgeon: Franchot Gallo, MD;  Location: [facility];  Service: Urology;  Laterality: N/A;  ? ? ?Family History  ?Problem Relation Age of Onset  ? Heart disease Mother   ?     CHF  ? Bipolar disorder Mother   ? Heart disease Father   ?     CHF  ? ? ?Social History  ? ?Socioeconomic History  ? Marital status: Married  ?  Spouse name: Not on file  ? Number of children: 2  ? Years of education: 62  ? Highest education level: High school graduate  ?Occupational History  ?  Employer: Retired  ?Tobacco Use  ? Smoking status: Former  ?  Packs/day: 2.00  ?  Years: 40.00  ?  Pack years: 80.00  ?  Types: Cigarettes  ?  Quit date: 02/15/2005  ?  Years since quitting: 16.5  ? Smokeless tobacco: Never  ?Vaping Use  ? Vaping Use: Never used  ?Substance and Sexual Activity  ? Alcohol use: Yes  ?  Alcohol/week: 0.0 standard drinks  ?  Comment: Occasional  ? Drug use: No  ? Sexual activity: Yes  ?  Partners: Female  ?Other Topics Concern  ? Not on file  ?Social History Narrative  ? Exercise--  Walks dogs everyday  ?   ? LIves with wife , no stairs in home, caffeine - one cup coffee day, exercise - not much, Right handed, 12th grade, retired  ?   ? One story  home  ? ?Social Determinants of Health  ? ?Financial Resource Strain: Not on file  ?Food Insecurity: Not on file  ?Transportation Needs: Not on file  ?Physical Activity: Not on file  ?Stress: Not on file  ?Social Connections: Not on file  ?Intimate Partner Violence: Not on file  ? ? ?Outpatient Medications Prior to Visit  ?Medication Sig Dispense Refill  ? acetaminophen (TYLENOL) 325 MG tablet Take 1-2 tablets (325-650 mg total) by mouth every 4 (four) hours as needed for mild pain.    ? amantadine (SYMMETREL) 100 MG capsule Take 1 capsule (100 mg total)  by mouth daily. 30 capsule 0  ? amLODipine (NORVASC) 10 MG tablet Take 1 tablet (10 mg total) by mouth every morning. 30 tablet 0  ? aspirin 81 MG EC tablet Take 1 tablet (81 mg total) by mouth daily. Swallow whole. 30 tablet 11  ? carBAMazepine (TEGRETOL) 100 MG/5ML suspension Take '100mg'$  (49ms) by mouth in the morning and '200mg'$  (143m) at night. 450 mL 3  ? Cholecalciferol (VITAMIN D) 50 MCG (2000 UT) CAPS Take 2,000 Units by mouth every morning.    ? ciprofloxacin (CIPRO) 500 MG tablet Take 1 tablet (500 mg total) by mouth 2 (two) times daily for 10 days. 20 tablet 0  ? clopidogrel (PLAVIX) 75 MG tablet Take 1 tablet (75 mg total) by mouth daily. 30 tablet 0  ? docusate sodium (COLACE) 100 MG capsule Take 1 capsule (100 mg total) by mouth daily. 10 capsule 0  ? fenofibrate 160 MG tablet Take 1 tablet (160 mg total) by mouth at bedtime. 30 tablet 0  ? Flaxseed, Linseed, (FLAXSEED OIL) 1200 MG CAPS Take 1,200 mg by mouth 2 (two) times daily.    ? fluticasone (CUTIVATE) 0.3.08 cream 1 application daily as needed (psoriasis).  3  ? hydrALAZINE (APRESOLINE) 25 MG tablet Take 1 tablet (25 mg total) by mouth every 8 (eight) hours. 90 tablet 0  ? lidocaine (LIDODERM) 5 % Place 1 patch onto the skin daily. Remove & Discard patch within 12 hours or as directed by MD 15 patch 1  ? LORazepam (ATIVAN) 0.5 MG tablet Take 1/2 tablet (0.25 mg total) by mouth daily. 30 tablet 0  ?  LORazepam (ATIVAN) 0.5 MG tablet Take 1/2 tablet by mouth every morning and take 1 tablet by mouth at bedtime 135 tablet 1  ? Multiple Vitamins-Minerals (ADULT ONE DAILY GUMMIES) CHEW Chew 1 tablet by mouth e

## 2021-09-08 NOTE — Patient Instructions (Signed)
Stroke Prevention ?Some medical conditions and behaviors can lead to a higher chance of having a stroke. You can help prevent a stroke by eating healthy, exercising, not smoking, and managing any medical conditions you have. ?Stroke is a leading cause of functional impairment. Primary prevention is particularly important because a majority of strokes are first-time events. Stroke changes the lives of not only those who experience a stroke but also their family and other caregivers. ?How can this condition affect me? ?A stroke is a medical emergency and should be treated right away. A stroke can lead to brain damage and can sometimes be life-threatening. If a person gets medical treatment right away, there is a better chance of surviving and recovering from a stroke. ?What can increase my risk? ?The following medical conditions may increase your risk of a stroke: ?Cardiovascular disease. ?High blood pressure (hypertension). ?Diabetes. ?High cholesterol. ?Sickle cell disease. ?Blood clotting disorders (hypercoagulable state). ?Obesity. ?Sleep disorders (obstructive sleep apnea). ?Other risk factors include: ?Being older than age 32. ?Having a history of blood clots, stroke, or mini-stroke (transient ischemic attack, TIA). ?Genetic factors, such as race, ethnicity, or a family history of stroke. ?Smoking cigarettes or using other tobacco products. ?Taking birth control pills, especially if you also use tobacco. ?Heavy use of alcohol or drugs, especially cocaine and methamphetamine. ?Physical inactivity. ?What actions can I take to prevent this? ?Manage your health conditions ?High cholesterol levels. ?Eating a healthy diet is important for preventing high cholesterol. If cholesterol cannot be managed through diet alone, you may need to take medicines. ?Take any prescribed medicines to control your cholesterol as told by your health care provider. ?Hypertension. ?To reduce your risk of stroke, try to keep your blood  pressure below 130/80. ?Eating a healthy diet and exercising regularly are important for controlling blood pressure. If these steps are not enough to manage your blood pressure, you may need to take medicines. ?Take any prescribed medicines to control hypertension as told by your health care provider. ?Ask your health care provider if you should monitor your blood pressure at home. ?Have your blood pressure checked every year, even if your blood pressure is normal. Blood pressure increases with age and some medical conditions. ?Diabetes. ?Eating a healthy diet and exercising regularly are important parts of managing your blood sugar (glucose). If your blood sugar cannot be managed through diet and exercise, you may need to take medicines. ?Take any prescribed medicines to control your diabetes as told by your health care provider. ?Get evaluated for obstructive sleep apnea. Talk to your health care provider about getting a sleep evaluation if you snore a lot or have excessive sleepiness. ?Make sure that any other medical conditions you have, such as atrial fibrillation or atherosclerosis, are managed. ?Nutrition ?Follow instructions from your health care provider about what to eat or drink to help manage your health condition. These instructions may include: ?Reducing your daily calorie intake. ?Limiting how much salt (sodium) you use to 1,500 milligrams (mg) each day. ?Using only healthy fats for cooking, such as olive oil, canola oil, or sunflower oil. ?Eating healthy foods. You can do this by: ?Choosing foods that are high in fiber, such as whole grains, and fresh fruits and vegetables. ?Eating at least 5 servings of fruits and vegetables a day. Try to fill one-half of your plate with fruits and vegetables at each meal. ?Choosing lean protein foods, such as lean cuts of meat, poultry without skin, fish, tofu, beans, and nuts. ?Eating low-fat dairy products. ?Avoiding  foods that are high in sodium. This can help  lower blood pressure. ?Avoiding foods that have saturated fat, trans fat, and cholesterol. This can help prevent high cholesterol. ?Avoiding processed and prepared foods. ?Counting your daily carbohydrate intake. ? ?Lifestyle ?If you drink alcohol: ?Limit how much you have to: ?0-1 drink a day for women who are not pregnant. ?0-2 drinks a day for men. ?Know how much alcohol is in your drink. In the [place] one drink equals one 12 oz bottle of beer (348m), one 5 oz glass of wine (1434m, or one 1? oz glass of hard liquor (4493m ?Do not use any products that contain nicotine or tobacco. These products include cigarettes, chewing tobacco, and vaping devices, such as e-cigarettes. If you need help quitting, ask your health care provider. ?Avoid secondhand smoke. ?Do not use drugs. ?Activity ? ?Try to stay at a healthy weight. ?Get at least 30 minutes of exercise on most days, such as: ?Fast walking. ?Biking. ?Swimming. ?Medicines ?Take over-the-counter and prescription medicines only as told by your health care provider. Aspirin or blood thinners (antiplatelets or anticoagulants) may be recommended to reduce your risk of forming blood clots that can lead to stroke. ?Avoid taking birth control pills. Talk to your health care provider about the risks of taking birth control pills if: ?You are over 35 65ars old. ?You smoke. ?You get very bad headaches. ?You have had a blood clot. ?Where to find more information ?American Stroke Association: www.strokeassociation.org ?Get help right away if: ?You or a loved one has any symptoms of a stroke. "BE FAST" is an easy way to remember the main warning signs of a stroke: ?B - Balance. Signs are dizziness, sudden trouble walking, or loss of balance. ?E - Eyes. Signs are trouble seeing or a sudden change in vision. ?F - Face. Signs are sudden weakness or numbness of the face, or the face or eyelid drooping on one side. ?A - Arms. Signs are weakness or numbness in an arm. This happens  suddenly and usually on one side of the body. ?S - Speech. Signs are sudden trouble speaking, slurred speech, or trouble understanding what people say. ?T - Time. Time to call emergency services. Write down what time symptoms started. ?You or a loved one has other signs of a stroke, such as: ?A sudden, severe headache with no known cause. ?Nausea or vomiting. ?Seizure. ?These symptoms may represent a serious problem that is an emergency. Do not wait to see if the symptoms will go away. Get medical help right away. Call your local emergency services (911 in the [place] Do not drive yourself to the hospital. ?Summary ?You can help to prevent a stroke by eating healthy, exercising, not smoking, limiting alcohol intake, and managing any medical conditions you may have. ?Do not use any products that contain nicotine or tobacco. These include cigarettes, chewing tobacco, and vaping devices, such as e-cigarettes. If you need help quitting, ask your health care provider. ?Remember "BE FAST" for warning signs of a stroke. Get help right away if you or a loved one has any of these signs. ?This information is not intended to replace advice given to you by your health care provider. Make sure you discuss any questions you have with your health care provider. ?Document Revised: 12/31/2019 Document Reviewed: 12/31/2019 ?Elsevier Patient Education ? [address] ?

## 2021-09-10 DIAGNOSIS — I129 Hypertensive chronic kidney disease with stage 1 through stage 4 chronic kidney disease, or unspecified chronic kidney disease: Secondary | ICD-10-CM | POA: Diagnosis not present

## 2021-09-10 DIAGNOSIS — I679 Cerebrovascular disease, unspecified: Secondary | ICD-10-CM | POA: Diagnosis not present

## 2021-09-10 DIAGNOSIS — C679 Malignant neoplasm of bladder, unspecified: Secondary | ICD-10-CM | POA: Diagnosis not present

## 2021-09-10 DIAGNOSIS — N1832 Chronic kidney disease, stage 3b: Secondary | ICD-10-CM | POA: Diagnosis not present

## 2021-09-10 DIAGNOSIS — N189 Chronic kidney disease, unspecified: Secondary | ICD-10-CM | POA: Diagnosis not present

## 2021-09-10 DIAGNOSIS — D631 Anemia in chronic kidney disease: Secondary | ICD-10-CM | POA: Diagnosis not present

## 2021-09-10 DIAGNOSIS — E559 Vitamin D deficiency, unspecified: Secondary | ICD-10-CM | POA: Diagnosis not present

## 2021-09-10 NOTE — Assessment & Plan Note (Addendum)
Pt/ot and speech therapy needed  ?Wife requesting palliative care and help in the home  ?

## 2021-09-11 DIAGNOSIS — I69359 Hemiplegia and hemiparesis following cerebral infarction affecting unspecified side: Secondary | ICD-10-CM | POA: Insufficient documentation

## 2021-09-11 NOTE — Assessment & Plan Note (Signed)
F/u neuro  

## 2021-09-14 ENCOUNTER — Other Ambulatory Visit: Payer: Self-pay | Admitting: Physician Assistant

## 2021-09-14 ENCOUNTER — Encounter: Payer: Self-pay | Admitting: Family Medicine

## 2021-09-15 ENCOUNTER — Encounter: Payer: Self-pay | Admitting: Family Medicine

## 2021-09-15 ENCOUNTER — Telehealth: Payer: Self-pay

## 2021-09-15 ENCOUNTER — Telehealth: Payer: Self-pay | Admitting: Family Medicine

## 2021-09-15 ENCOUNTER — Other Ambulatory Visit (HOSPITAL_BASED_OUTPATIENT_CLINIC_OR_DEPARTMENT_OTHER): Payer: Self-pay

## 2021-09-15 ENCOUNTER — Other Ambulatory Visit: Payer: Self-pay | Admitting: Family Medicine

## 2021-09-15 DIAGNOSIS — M47817 Spondylosis without myelopathy or radiculopathy, lumbosacral region: Secondary | ICD-10-CM | POA: Diagnosis not present

## 2021-09-15 DIAGNOSIS — R7303 Prediabetes: Secondary | ICD-10-CM | POA: Diagnosis not present

## 2021-09-15 DIAGNOSIS — I69359 Hemiplegia and hemiparesis following cerebral infarction affecting unspecified side: Secondary | ICD-10-CM

## 2021-09-15 DIAGNOSIS — N184 Chronic kidney disease, stage 4 (severe): Secondary | ICD-10-CM | POA: Diagnosis not present

## 2021-09-15 DIAGNOSIS — K8020 Calculus of gallbladder without cholecystitis without obstruction: Secondary | ICD-10-CM | POA: Diagnosis not present

## 2021-09-15 DIAGNOSIS — Z7982 Long term (current) use of aspirin: Secondary | ICD-10-CM | POA: Diagnosis not present

## 2021-09-15 DIAGNOSIS — N400 Enlarged prostate without lower urinary tract symptoms: Secondary | ICD-10-CM | POA: Diagnosis not present

## 2021-09-15 DIAGNOSIS — Z87891 Personal history of nicotine dependence: Secondary | ICD-10-CM | POA: Diagnosis not present

## 2021-09-15 DIAGNOSIS — D631 Anemia in chronic kidney disease: Secondary | ICD-10-CM | POA: Diagnosis not present

## 2021-09-15 DIAGNOSIS — F0150 Vascular dementia without behavioral disturbance: Secondary | ICD-10-CM | POA: Diagnosis not present

## 2021-09-15 DIAGNOSIS — I69318 Other symptoms and signs involving cognitive functions following cerebral infarction: Secondary | ICD-10-CM | POA: Diagnosis not present

## 2021-09-15 DIAGNOSIS — Z9181 History of falling: Secondary | ICD-10-CM | POA: Diagnosis not present

## 2021-09-15 DIAGNOSIS — G4700 Insomnia, unspecified: Secondary | ICD-10-CM | POA: Diagnosis not present

## 2021-09-15 DIAGNOSIS — I639 Cerebral infarction, unspecified: Secondary | ICD-10-CM

## 2021-09-15 DIAGNOSIS — I69351 Hemiplegia and hemiparesis following cerebral infarction affecting right dominant side: Secondary | ICD-10-CM | POA: Diagnosis not present

## 2021-09-15 DIAGNOSIS — M47816 Spondylosis without myelopathy or radiculopathy, lumbar region: Secondary | ICD-10-CM | POA: Diagnosis not present

## 2021-09-15 DIAGNOSIS — I129 Hypertensive chronic kidney disease with stage 1 through stage 4 chronic kidney disease, or unspecified chronic kidney disease: Secondary | ICD-10-CM | POA: Diagnosis not present

## 2021-09-15 DIAGNOSIS — Z8551 Personal history of malignant neoplasm of bladder: Secondary | ICD-10-CM | POA: Diagnosis not present

## 2021-09-15 DIAGNOSIS — Z7902 Long term (current) use of antithrombotics/antiplatelets: Secondary | ICD-10-CM | POA: Diagnosis not present

## 2021-09-15 DIAGNOSIS — I6523 Occlusion and stenosis of bilateral carotid arteries: Secondary | ICD-10-CM | POA: Diagnosis not present

## 2021-09-15 DIAGNOSIS — Z87440 Personal history of urinary (tract) infections: Secondary | ICD-10-CM | POA: Diagnosis not present

## 2021-09-15 DIAGNOSIS — K219 Gastro-esophageal reflux disease without esophagitis: Secondary | ICD-10-CM | POA: Diagnosis not present

## 2021-09-15 DIAGNOSIS — E559 Vitamin D deficiency, unspecified: Secondary | ICD-10-CM | POA: Diagnosis not present

## 2021-09-15 DIAGNOSIS — M47818 Spondylosis without myelopathy or radiculopathy, sacral and sacrococcygeal region: Secondary | ICD-10-CM | POA: Diagnosis not present

## 2021-09-15 DIAGNOSIS — R451 Restlessness and agitation: Secondary | ICD-10-CM

## 2021-09-15 DIAGNOSIS — E785 Hyperlipidemia, unspecified: Secondary | ICD-10-CM | POA: Diagnosis not present

## 2021-09-15 MED ORDER — PANTOPRAZOLE SODIUM 40 MG PO PACK
40.0000 mg | PACK | Freq: Every day | ORAL | 3 refills | Status: DC
Start: 2021-09-15 — End: 2022-02-19
  Filled 2021-09-15: qty 30, 30d supply, fill #0

## 2021-09-15 MED ORDER — CARBAMAZEPINE ER 200 MG PO TB12
ORAL_TABLET | ORAL | 1 refills | Status: DC
Start: 2021-09-15 — End: 2021-10-05
  Filled 2021-09-15: qty 30, 30d supply, fill #0

## 2021-09-15 MED ORDER — HYDRALAZINE HCL 25 MG PO TABS
25.0000 mg | ORAL_TABLET | Freq: Three times a day (TID) | ORAL | 0 refills | Status: DC
  Filled 2021-09-15: qty 90, 30d supply, fill #0

## 2021-09-15 MED ORDER — PANTOPRAZOLE SODIUM 40 MG PO TBEC
40.0000 mg | DELAYED_RELEASE_TABLET | Freq: Every day | ORAL | 3 refills | Status: DC
Start: 2021-09-15 — End: 2022-11-14
  Filled 2021-09-15: qty 90, 90d supply, fill #0
  Filled 2021-12-12: qty 90, 90d supply, fill #1
  Filled 2022-05-23: qty 90, 90d supply, fill #2
  Filled 2022-08-22: qty 90, 90d supply, fill #3

## 2021-09-15 MED ORDER — LORAZEPAM 1 MG PO TABS
1.0000 mg | ORAL_TABLET | Freq: Every day | ORAL | 1 refills | Status: DC
Start: 2021-09-15 — End: 2021-11-23
  Filled 2021-09-15: qty 30, 30d supply, fill #0
  Filled 2021-10-26: qty 30, 30d supply, fill #1

## 2021-09-15 MED ORDER — CARBAMAZEPINE ER 100 MG PO TB12
ORAL_TABLET | ORAL | 1 refills | Status: DC
  Filled 2021-09-15: qty 30, 30d supply, fill #0

## 2021-09-15 MED ORDER — AMANTADINE HCL 100 MG PO CAPS
100.0000 mg | ORAL_CAPSULE | Freq: Every day | ORAL | 0 refills | Status: DC
  Filled 2021-09-15: qty 30, 30d supply, fill #0

## 2021-09-15 MED ORDER — CARBAMAZEPINE 100 MG/5ML PO SUSP
ORAL | 3 refills | Status: DC
Start: 2021-09-15 — End: 2021-09-15
  Filled 2021-09-15: qty 450, 30d supply, fill #0

## 2021-09-15 MED ORDER — CLOPIDOGREL BISULFATE 75 MG PO TABS
75.0000 mg | ORAL_TABLET | Freq: Every day | ORAL | 0 refills | Status: DC
  Filled 2021-09-15: qty 30, 30d supply, fill #0

## 2021-09-15 NOTE — Telephone Encounter (Signed)
[facility] is requesting orders for a modified barium swallow study done at [facility]. If they are unable to do it [facility]. She would like a call when this is done. Please advise.  ?

## 2021-09-15 NOTE — Telephone Encounter (Signed)
PA initiated via Covermymeds; KEY: [id number]. Awaiting determination.  ?

## 2021-09-15 NOTE — Telephone Encounter (Signed)
PA approved.  ? ?04-APR-23:31-DEC-23 Protonix '40MG'$  OR PACK Quantity:30; ?

## 2021-09-16 ENCOUNTER — Other Ambulatory Visit (HOSPITAL_BASED_OUTPATIENT_CLINIC_OR_DEPARTMENT_OTHER): Payer: Self-pay

## 2021-09-16 NOTE — Addendum Note (Signed)
Addended by: Sanda Linger on: 09/16/2021 04:03 PM ? ? Modules accepted: Orders ? ?

## 2021-09-16 NOTE — Telephone Encounter (Signed)
Orders have been placed. [facility] made aware. ?

## 2021-09-17 ENCOUNTER — Encounter: Payer: Self-pay | Admitting: Family Medicine

## 2021-09-17 ENCOUNTER — Ambulatory Visit: Payer: PPO | Admitting: Neurology

## 2021-09-17 ENCOUNTER — Other Ambulatory Visit (HOSPITAL_COMMUNITY): Payer: Self-pay

## 2021-09-17 ENCOUNTER — Telehealth: Payer: Self-pay | Admitting: Family Medicine

## 2021-09-17 ENCOUNTER — Other Ambulatory Visit: Payer: Self-pay | Admitting: Family Medicine

## 2021-09-17 DIAGNOSIS — R1310 Dysphagia, unspecified: Secondary | ICD-10-CM

## 2021-09-17 DIAGNOSIS — I639 Cerebral infarction, unspecified: Secondary | ICD-10-CM

## 2021-09-17 NOTE — Telephone Encounter (Signed)
[facility] is requesting orders for speech therapy and OT outpatient. Please advise.  ?

## 2021-09-18 DIAGNOSIS — G9341 Metabolic encephalopathy: Secondary | ICD-10-CM | POA: Diagnosis not present

## 2021-09-18 DIAGNOSIS — S2249XA Multiple fractures of ribs, unspecified side, initial encounter for closed fracture: Secondary | ICD-10-CM | POA: Diagnosis not present

## 2021-09-18 DIAGNOSIS — I6381 Other cerebral infarction due to occlusion or stenosis of small artery: Secondary | ICD-10-CM | POA: Diagnosis not present

## 2021-09-18 DIAGNOSIS — G20 Parkinson's disease: Secondary | ICD-10-CM | POA: Diagnosis not present

## 2021-09-18 DIAGNOSIS — M47816 Spondylosis without myelopathy or radiculopathy, lumbar region: Secondary | ICD-10-CM | POA: Diagnosis not present

## 2021-09-18 DIAGNOSIS — N184 Chronic kidney disease, stage 4 (severe): Secondary | ICD-10-CM | POA: Diagnosis not present

## 2021-09-19 DIAGNOSIS — G20 Parkinson's disease: Secondary | ICD-10-CM | POA: Diagnosis not present

## 2021-09-19 DIAGNOSIS — M47816 Spondylosis without myelopathy or radiculopathy, lumbar region: Secondary | ICD-10-CM | POA: Diagnosis not present

## 2021-09-19 DIAGNOSIS — I6381 Other cerebral infarction due to occlusion or stenosis of small artery: Secondary | ICD-10-CM | POA: Diagnosis not present

## 2021-09-19 DIAGNOSIS — N184 Chronic kidney disease, stage 4 (severe): Secondary | ICD-10-CM | POA: Diagnosis not present

## 2021-09-19 DIAGNOSIS — G9341 Metabolic encephalopathy: Secondary | ICD-10-CM | POA: Diagnosis not present

## 2021-09-19 DIAGNOSIS — S2249XA Multiple fractures of ribs, unspecified side, initial encounter for closed fracture: Secondary | ICD-10-CM | POA: Diagnosis not present

## 2021-09-21 ENCOUNTER — Other Ambulatory Visit: Payer: Self-pay | Admitting: Family Medicine

## 2021-09-21 DIAGNOSIS — Z8673 Personal history of transient ischemic attack (TIA), and cerebral infarction without residual deficits: Secondary | ICD-10-CM

## 2021-09-21 NOTE — Telephone Encounter (Signed)
Orders placed.

## 2021-09-21 NOTE — Telephone Encounter (Signed)
Okay to place orders 

## 2021-09-23 ENCOUNTER — Other Ambulatory Visit (INDEPENDENT_AMBULATORY_CARE_PROVIDER_SITE_OTHER): Payer: PPO

## 2021-09-23 DIAGNOSIS — Z87440 Personal history of urinary (tract) infections: Secondary | ICD-10-CM

## 2021-09-23 LAB — POC URINALSYSI DIPSTICK (AUTOMATED)
Bilirubin, UA: NEGATIVE
Blood, UA: NEGATIVE
Glucose, UA: NEGATIVE
Ketones, UA: NEGATIVE
Leukocytes, UA: NEGATIVE
Nitrite, UA: NEGATIVE
Protein, UA: POSITIVE — AB
Spec Grav, UA: 1.02 (ref 1.010–1.025)
Urobilinogen, UA: 0.2 E.U./dL
pH, UA: 6 (ref 5.0–8.0)

## 2021-09-23 NOTE — Progress Notes (Signed)
No charge, pt unable to urinate at 3/28 office visit and spouse returned specimen today. ?

## 2021-09-23 NOTE — Addendum Note (Signed)
Addended by: Kelle Darting A on: 09/23/2021 01:39 PM ? ? Modules accepted: Orders ? ?

## 2021-09-23 NOTE — Addendum Note (Signed)
Addended by: Kelle Darting A on: 09/23/2021 01:38 PM ? ? Modules accepted: Orders ? ?

## 2021-09-24 DIAGNOSIS — S2249XA Multiple fractures of ribs, unspecified side, initial encounter for closed fracture: Secondary | ICD-10-CM | POA: Diagnosis not present

## 2021-09-24 DIAGNOSIS — I6381 Other cerebral infarction due to occlusion or stenosis of small artery: Secondary | ICD-10-CM | POA: Diagnosis not present

## 2021-09-24 DIAGNOSIS — N184 Chronic kidney disease, stage 4 (severe): Secondary | ICD-10-CM | POA: Diagnosis not present

## 2021-09-24 DIAGNOSIS — M47816 Spondylosis without myelopathy or radiculopathy, lumbar region: Secondary | ICD-10-CM | POA: Diagnosis not present

## 2021-09-24 DIAGNOSIS — G9341 Metabolic encephalopathy: Secondary | ICD-10-CM | POA: Diagnosis not present

## 2021-09-24 DIAGNOSIS — G20 Parkinson's disease: Secondary | ICD-10-CM | POA: Diagnosis not present

## 2021-09-29 ENCOUNTER — Ambulatory Visit: Payer: PPO | Admitting: Podiatry

## 2021-09-29 DIAGNOSIS — B351 Tinea unguium: Secondary | ICD-10-CM | POA: Diagnosis not present

## 2021-09-29 DIAGNOSIS — M79676 Pain in unspecified toe(s): Secondary | ICD-10-CM | POA: Diagnosis not present

## 2021-10-01 ENCOUNTER — Encounter: Payer: Self-pay | Admitting: Physical Therapy

## 2021-10-01 ENCOUNTER — Ambulatory Visit (HOSPITAL_COMMUNITY)
Admission: RE | Admit: 2021-10-01 | Discharge: 2021-10-01 | Disposition: A | Discharge status: Home / Self Care | Payer: PPO | Source: Ambulatory Visit | Attending: Family Medicine | Admitting: Family Medicine

## 2021-10-01 ENCOUNTER — Ambulatory Visit: Payer: PPO | Attending: Family Medicine | Admitting: Physical Therapy

## 2021-10-01 DIAGNOSIS — R296 Repeated falls: Secondary | ICD-10-CM | POA: Diagnosis not present

## 2021-10-01 DIAGNOSIS — R471 Dysarthria and anarthria: Secondary | ICD-10-CM | POA: Insufficient documentation

## 2021-10-01 DIAGNOSIS — R1310 Dysphagia, unspecified: Secondary | ICD-10-CM

## 2021-10-01 DIAGNOSIS — M6281 Muscle weakness (generalized): Secondary | ICD-10-CM | POA: Diagnosis not present

## 2021-10-01 DIAGNOSIS — R1312 Dysphagia, oropharyngeal phase: Secondary | ICD-10-CM | POA: Insufficient documentation

## 2021-10-01 DIAGNOSIS — Z8673 Personal history of transient ischemic attack (TIA), and cerebral infarction without residual deficits: Secondary | ICD-10-CM | POA: Diagnosis not present

## 2021-10-01 DIAGNOSIS — R278 Other lack of coordination: Secondary | ICD-10-CM

## 2021-10-01 DIAGNOSIS — I69359 Hemiplegia and hemiparesis following cerebral infarction affecting unspecified side: Secondary | ICD-10-CM | POA: Insufficient documentation

## 2021-10-01 DIAGNOSIS — R41841 Cognitive communication deficit: Secondary | ICD-10-CM | POA: Insufficient documentation

## 2021-10-01 DIAGNOSIS — R262 Difficulty in walking, not elsewhere classified: Secondary | ICD-10-CM

## 2021-10-01 DIAGNOSIS — I6381 Other cerebral infarction due to occlusion or stenosis of small artery: Secondary | ICD-10-CM

## 2021-10-01 DIAGNOSIS — I69354 Hemiplegia and hemiparesis following cerebral infarction affecting left non-dominant side: Secondary | ICD-10-CM

## 2021-10-01 DIAGNOSIS — K219 Gastro-esophageal reflux disease without esophagitis: Secondary | ICD-10-CM | POA: Diagnosis not present

## 2021-10-01 NOTE — Therapy (Signed)
[facility] [address] ?[place], [name], [address] ?Phone: [phone]   Fax:  [phone] ? ?Physical Therapy Evaluation ? ?Patient Details  ?Name: William [name] ?MRN: [id number] ?Date of Birth: 1940-12-23 ?Referring Provider (PT): Lowne ? ? ?Encounter Date: 10/01/2021 ? ? PT End of Session - 10/01/21 1317   ? ? Visit Number 1   ? Date for PT Re-Evaluation 12/31/21   ? PT Start Time 1055   ? PT Stop Time 1145   ? PT Time Calculation (min) 50 min   ? Activity Tolerance Patient tolerated treatment well   ? Behavior During Therapy Flat affect;Impulsive   ? ?  ?  ? ?  ? ? ?Past Medical History:  ?Diagnosis Date  ? Arthritis   ? low back  ? 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 Mohs surgery jan 2016   ? Bladder stone   ? BPH (benign prostatic hyperplasia) 08/06/2007  ? Carotid artery occlusion   ? Chronic kidney disease 2014  ? Stage III  ? Closed fracture of fifth metacarpal bone 05/15/2015  ? Eczema   ? Fasting hyperglycemia 12/21/2006  ? GERD (gastroesophageal reflux disease)   ? History of carotid artery stenosis   ? S/P BILATERAL CEA  ? 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 HTN (hypertension) 07/19/2015  ? Hyperlipidemia   ? Hypertension   ? 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 Mild, related to stroke history  ? Nocturia   ? Renal insufficiency 06/25/2013  ? S/P carotid endarterectomy   ? BILATERAL ICA--  PATENT PER DUPLEX  05-19-2012  ? 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 Right calf  ? Urinary frequency   ? Vitamin D deficiency   ? ? ?Past Surgical History:  ?Procedure Laterality Date  ? APPENDECTOMY  AS CHILD  ? CARDIOVASCULAR STRESS TEST  03-27-2012  DR CRENSHAW  ? LOW RISK LEXISCAN STUDY-- PROBABLE NORMAL PERFUSION AND SOFT TISSUE ATTENUATION/  NO ISCHEMIA/ EF 51%  ? CAROTID ENDARTERECTOMY Bilateral LEFT  11-12-2008  DR GREG HAYES  ? RIGHT ICA  2006  ([facility])  ? CYSTOSCOPY W/ RETROGRADES  Bilateral 06/22/2021  ? Procedure: CYSTOSCOPY WITH RETROGRADE PYELOGRAM;  Surgeon: Franchot Gallo, MD;  Location: [facility];  Service: Urology;  Laterality: Bilateral;  ? CYSTOSCOPY WITH LITHOLAPAXY N/A 02/26/2013  ? Procedure: CYSTOSCOPY WITH LITHOLAPAXY;  Surgeon: Franchot Gallo, MD;  Location: [facility];  Service: Urology;  Laterality: N/A;  ? EYE SURGERY  Jan. 2016  ? cataract surgery both eyes  ? INGUINAL HERNIA REPAIR Right 11-08-2006  ? IR KYPHO EA ADDL LEVEL THORACIC OR LUMBAR  02/12/2021  ? IR RADIOLOGIST EVAL & MGMT  02/18/2021  ? MASS EXCISION N/A 03/03/2016  ? Procedure: EXCISION OF BACK  MASS;  Surgeon: Stark Klein, MD;  Location: [facility];  Service: General;  Laterality: N/A;  ? MOHS SURGERY Left 1/ 2016  ? Dr Nevada Crane-- Basal cell  ? PROSTATE SURGERY    ? 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-C N/A 06/22/2021  ? Procedure: TRANSURETHRAL RESECTION OF BLADDER TUMOR;  Surgeon: Franchot Gallo, MD;  Location: [facility];  Service: Urology;  Laterality: N/A;  ? TRANSURETHRAL RESECTION OF PROSTATE N/A 02/26/2013  ? Procedure: TRANSURETHRAL RESECTION OF THE PROSTATE WITH GYRUS INSTRUMENTS;  Surgeon: Franchot Gallo, MD;  Location: [facility];  Service: Urology;  Laterality: N/A;  ?  TRANSURETHRAL RESECTION OF PROSTATE N/A 06/22/2021  ? Procedure: TRANSURETHRAL RESECTION OF THE PROSTATE (TURP);  Surgeon: Franchot Gallo, MD;  Location: [facility];  Service: Urology;  Laterality: N/A;  ? ? ?There were no vitals filed for this visit. ? ? ? Subjective Assessment - 10/01/21 1101   ? ? Subjective Patient had a second strke that affected his right side, had previous stroke that affected the left side a few years ago.  He was in the hospital 4 weeks.  Then to home and had home health PT, OT and ST.  has had 2 falls since he came home both coming in the back door.  Has been using a walker, a cane and a  walking stick   ? Pertinent History CVA's   ? Patient Stated Goals walk beter, move better, no falls   ? Currently in Pain? No/denies   ? ?  ?  ? ?  ? ? ? ? ? [facility] PT Assessment - 10/01/21 0001   ? ?  ? Assessment  ? Medical Diagnosis CVA with right sided weakness, falls, weakness, difficulty walking   ? Referring Provider (PT) Etter Sjogren   ? Onset Date/Surgical Date 07/23/21   ? Hand Dominance Right   ? Prior Therapy past for CVA, back pain   ?  ? Precautions  ? Precautions Fall   ?  ? Balance Screen  ? Has the patient fallen in the past 6 months Yes   ? How many times? 3   ? Has the patient had a decrease in activity level because of a fear of falling?  Yes   ? Is the patient reluctant to leave their home because of a fear of falling?  Yes   ?  ? Home Environment  ? Additional Comments single story house, walks dogs   ?  ? Prior Function  ? Level of Independence Independent;Independent with basic ADLs;Needs assistance with homemaking   ? Vocation Retired   ? Leisure walks dog   ?  ? Posture/Postural Control  ? Posture Comments fwd head, rounded shoulder, slumps and slouches to the left   ?  ? AROM  ? AROM Assessment Site Knee   ? Right/Left Knee Right;Left   ? Right Knee Extension 10   ? Left Knee Extension 10   ?  ? Strength  ? Strength Assessment Site Hip;Knee;Ankle   ? Right/Left Hip Right;Left   ? Right Hip Flexion 3+/5   ? Right Hip Extension 3+/5   ? Right Hip External Rotation  3+/5   ? Right Hip ABduction 3+/5   ? Right Hip ADduction 3+/5   ? Left Hip Flexion 3+/5   ? Left Hip Extension 3+/5   ? Left Hip External Rotation 3+/5   ? Left Hip ABduction 3+/5   ? Left Hip ADduction 3+/5   ? Right/Left Knee Right;Left   ? Right Knee Flexion 4/5   ? Right Knee Extension 4-/5   ? Left Knee Flexion 4/5   ? Left Knee Extension 4-/5   ? Right/Left Ankle Right;Left   ? Right Ankle Dorsiflexion 4-/5   ? Right Ankle Inversion 4-/5   ? Right Ankle Eversion 4-/5   ? Left Ankle Dorsiflexion 4-/5   ? Left Ankle Inversion 4-/5    ? Left Ankle Eversion 4-/5   ?  ? Transfers  ? Comments definite need of hands, very unsafe with the walker, tends to leave it and then tries to walk around it   ?  ?  Ambulation/Gait  ? Gait Comments used a FWW slow, tends to drag left leg and keeps the left knee bent   ? ?  ?  ? ?  ? ? ? ? ? ? ? ? ? ? ? ? ? ?Objective measurements completed on examination: See above findings.  ? ? ? ? ? ? ? ? ? ? ? ? ? ? ? ? PT Short Term Goals - 10/01/21 1322   ? ?  ? PT SHORT TERM GOAL #1  ? Title independent with initial HEP   ? Time 2   ? Period Weeks   ? Status New   ? ?  ?  ? ?  ? ? ? ? PT Long Term Goals - 10/01/21 1322   ? ?  ? PT LONG TERM GOAL #1  ? Title Understand fall risks, the importance of exercise and flexibility to health and function   ? Time 12   ? Period Weeks   ? Status New   ?  ? PT LONG TERM GOAL #2  ? Title decrease TUG time to 13 seconds   ? Time 12   ? Period Weeks   ? Status New   ?  ? PT LONG TERM GOAL #3  ? Title walk around our building without rest    ? Time 12   ? Period Weeks   ? Status New   ?  ? PT LONG TERM GOAL #4  ? Title increase LE strength to 4+/5   ? Time 12   ? Period Weeks   ? Status New   ?  ? PT LONG TERM GOAL #5  ? Title increase Berg blance test score to 46/56   ? Time 12   ? Period Weeks   ? Status New   ? ?  ?  ? ?  ? ? ? ? ? ? ? ? ? Plan - 10/01/21 1319   ? ? Clinical Impression Statement Patient has a history of CVA with left hemiplegia, we have seen him here over the past few years for balance, pain and weakness off and on, he sustained a 2nd CVA that affected his right side in February.  He was in the hospital for 4 weeks and then had some home therapies.  He has had 2 recent falls both coming in the house from the back deck.  He is having much more mental decline and difficulty with speech and with emotion, wife reports that he gets very agitated in the evenings.  He has poor strength in the LE's tends to demonstrate left sided neglect.  Uses a walker for ambulation but is  very unsafe with transfers.  Shuffles feet, left leg fatigues quickly   ? Clinical Decision Making Moderate   ? Rehab Potential Good   ? PT Frequency 2x / week   ? PT Duration 12 weeks   ? PT Treatment/Intervent

## 2021-10-02 ENCOUNTER — Other Ambulatory Visit: Payer: Self-pay

## 2021-10-02 ENCOUNTER — Emergency Department (HOSPITAL_COMMUNITY)
Admission: EM | Admit: 2021-10-02 | Discharge: 2021-10-03 | Disposition: A | Discharge status: Home / Self Care | Payer: PPO | Attending: Emergency Medicine | Admitting: Emergency Medicine

## 2021-10-02 ENCOUNTER — Encounter (HOSPITAL_COMMUNITY): Payer: Self-pay

## 2021-10-02 ENCOUNTER — Emergency Department (HOSPITAL_COMMUNITY): Payer: PPO

## 2021-10-02 ENCOUNTER — Encounter: Payer: Self-pay | Admitting: Physical Medicine and Rehabilitation

## 2021-10-02 DIAGNOSIS — K5641 Fecal impaction: Secondary | ICD-10-CM | POA: Diagnosis not present

## 2021-10-02 DIAGNOSIS — R7309 Other abnormal glucose: Secondary | ICD-10-CM | POA: Diagnosis not present

## 2021-10-02 DIAGNOSIS — K529 Noninfective gastroenteritis and colitis, unspecified: Secondary | ICD-10-CM | POA: Insufficient documentation

## 2021-10-02 DIAGNOSIS — Z7982 Long term (current) use of aspirin: Secondary | ICD-10-CM | POA: Insufficient documentation

## 2021-10-02 DIAGNOSIS — N400 Enlarged prostate without lower urinary tract symptoms: Secondary | ICD-10-CM | POA: Diagnosis not present

## 2021-10-02 DIAGNOSIS — R42 Dizziness and giddiness: Secondary | ICD-10-CM | POA: Diagnosis not present

## 2021-10-02 DIAGNOSIS — R231 Pallor: Secondary | ICD-10-CM | POA: Diagnosis not present

## 2021-10-02 DIAGNOSIS — R55 Syncope and collapse: Secondary | ICD-10-CM | POA: Insufficient documentation

## 2021-10-02 DIAGNOSIS — Z91040 Latex allergy status: Secondary | ICD-10-CM | POA: Diagnosis not present

## 2021-10-02 DIAGNOSIS — K4020 Bilateral inguinal hernia, without obstruction or gangrene, not specified as recurrent: Secondary | ICD-10-CM | POA: Diagnosis not present

## 2021-10-02 DIAGNOSIS — Z79899 Other long term (current) drug therapy: Secondary | ICD-10-CM | POA: Insufficient documentation

## 2021-10-02 DIAGNOSIS — I10 Essential (primary) hypertension: Secondary | ICD-10-CM | POA: Insufficient documentation

## 2021-10-02 DIAGNOSIS — K5900 Constipation, unspecified: Secondary | ICD-10-CM | POA: Diagnosis present

## 2021-10-02 DIAGNOSIS — K5730 Diverticulosis of large intestine without perforation or abscess without bleeding: Secondary | ICD-10-CM | POA: Diagnosis not present

## 2021-10-02 DIAGNOSIS — K8020 Calculus of gallbladder without cholecystitis without obstruction: Secondary | ICD-10-CM | POA: Diagnosis not present

## 2021-10-02 LAB — CBG MONITORING, ED: Glucose-Capillary: 146 mg/dL — ABNORMAL HIGH (ref 70–99)

## 2021-10-02 LAB — CBC WITH DIFFERENTIAL/PLATELET
Abs Immature Granulocytes: 0.07 10*3/uL (ref 0.00–0.07)
Basophils Absolute: 0.1 10*3/uL (ref 0.0–0.1)
Basophils Relative: 0 %
Eosinophils Absolute: 0.1 10*3/uL (ref 0.0–0.5)
Eosinophils Relative: 1 %
HCT: 34.4 % — ABNORMAL LOW (ref 39.0–52.0)
Hemoglobin: 11.2 g/dL — ABNORMAL LOW (ref 13.0–17.0)
Immature Granulocytes: 1 %
Lymphocytes Relative: 8 %
Lymphs Abs: 1.1 10*3/uL (ref 0.7–4.0)
MCH: 31.7 pg (ref 26.0–34.0)
MCHC: 32.6 g/dL (ref 30.0–36.0)
MCV: 97.5 fL (ref 80.0–100.0)
Monocytes Absolute: 0.9 10*3/uL (ref 0.1–1.0)
Monocytes Relative: 7 %
Neutro Abs: 11.4 10*3/uL — ABNORMAL HIGH (ref 1.7–7.7)
Neutrophils Relative %: 83 %
Platelets: 255 10*3/uL (ref 150–400)
RBC: 3.53 MIL/uL — ABNORMAL LOW (ref 4.22–5.81)
RDW: 13.5 % (ref 11.5–15.5)
WBC: 13.6 10*3/uL — ABNORMAL HIGH (ref 4.0–10.5)
nRBC: 0 % (ref 0.0–0.2)

## 2021-10-02 LAB — COMPREHENSIVE METABOLIC PANEL
ALT: 19 U/L (ref 0–44)
AST: 21 U/L (ref 15–41)
Albumin: 3.5 g/dL (ref 3.5–5.0)
Alkaline Phosphatase: 107 U/L (ref 38–126)
Anion gap: 10 (ref 5–15)
BUN: 24 mg/dL — ABNORMAL HIGH (ref 8–23)
CO2: 17 mmol/L — ABNORMAL LOW (ref 22–32)
Calcium: 8.9 mg/dL (ref 8.9–10.3)
Chloride: 110 mmol/L (ref 98–111)
Creatinine, Ser: 1.82 mg/dL — ABNORMAL HIGH (ref 0.61–1.24)
GFR, Estimated: 37 mL/min — ABNORMAL LOW (ref >60)
Glucose, Bld: 146 mg/dL — ABNORMAL HIGH (ref 70–99)
Potassium: 4.2 mmol/L (ref 3.5–5.1)
Sodium: 137 mmol/L (ref 135–145)
Total Bilirubin: 0.5 mg/dL (ref 0.3–1.2)
Total Protein: 6.6 g/dL (ref 6.5–8.1)

## 2021-10-02 MED ORDER — METRONIDAZOLE 500 MG PO TABS
500.0000 mg | ORAL_TABLET | Freq: Once | ORAL | Status: AC
  Administered 2021-10-02: 500 mg via ORAL
  Filled 2021-10-02: qty 1

## 2021-10-02 MED ORDER — SORBITOL 70 % SOLN
960.0000 mL | TOPICAL_OIL | Freq: Once | ORAL | Status: DC
  Filled 2021-10-02 (×2): qty 473

## 2021-10-02 MED ORDER — CIPROFLOXACIN HCL 500 MG PO TABS
500.0000 mg | ORAL_TABLET | Freq: Once | ORAL | Status: AC
  Administered 2021-10-02: 500 mg via ORAL
  Filled 2021-10-02: qty 1

## 2021-10-02 MED ORDER — POLYETHYLENE GLYCOL 3350 17 G PO PACK: 17.0000 g | PACK | Freq: Every day | ORAL | 0 refills | Status: AC

## 2021-10-02 MED ORDER — SODIUM CHLORIDE 0.9 % IV BOLUS
500.0000 mL | Freq: Once | INTRAVENOUS | Status: AC
  Administered 2021-10-02: 500 mL via INTRAVENOUS

## 2021-10-02 MED ORDER — CIPROFLOXACIN HCL 500 MG PO TABS
500.0000 mg | ORAL_TABLET | Freq: Two times a day (BID) | ORAL | 0 refills | Status: DC
Start: 2021-10-02 — End: 2021-11-12

## 2021-10-02 MED ORDER — SODIUM CHLORIDE 0.9 % IV BOLUS
1000.0000 mL | Freq: Once | INTRAVENOUS | Status: AC
  Administered 2021-10-02: 1000 mL via INTRAVENOUS

## 2021-10-02 MED ORDER — IOHEXOL 300 MG/ML  SOLN
80.0000 mL | Freq: Once | INTRAMUSCULAR | Status: AC | PRN
  Administered 2021-10-02: 80 mL via INTRAVENOUS

## 2021-10-02 MED ORDER — METRONIDAZOLE 500 MG PO TABS: 500.0000 mg | ORAL_TABLET | Freq: Two times a day (BID) | ORAL | 0 refills | Status: DC

## 2021-10-02 MED ORDER — DOCUSATE SODIUM 100 MG PO CAPS: 100.0000 mg | ORAL_CAPSULE | Freq: Two times a day (BID) | ORAL | 0 refills | Status: DC

## 2021-10-02 NOTE — ED Provider Notes (Signed)
?[facility] ?Provider Note ? ? ?CSN: [id number] ?Arrival date & time: 10/02/21  1721 ? ?  ? ?History ? ?Chief Complaint  ?Patient presents with  ? Constipation  ? Near Syncope  ? ? ?William [name] is a 81 y.o. male. ? ?HPI ?81 year old male presents with constipation and near syncope.  Patient is able to provide most of the history and his wife also contributes.  Wife noticed him to be bearing down on the toilet and was getting lightheaded and pale and almost passed out.  Patient states that this occurred only while he was straining and he never actually passed out.  He denies any headache, chest pain, shortness of breath.  He has some abdominal pain since this AM but has had constipation for several days.  Wife has given him some MiraLAX and he does not typically take his Colace.  Otherwise, no recent illness.  Patient feels like he needs to go to the bathroom but cannot get it out. ? ?Home Medications ?Prior to Admission medications   ?Medication Sig Start Date End Date Taking? Authorizing Provider  ?ciprofloxacin (CIPRO) 500 MG tablet Take 1 tablet (500 mg total) by mouth every 12 (twelve) hours. 10/02/21  Yes Sherwood Gambler, MD  ?docusate sodium (COLACE) 100 MG capsule Take 1 capsule (100 mg total) by mouth every 12 (twelve) hours. 10/02/21  Yes Sherwood Gambler, MD  ?metroNIDAZOLE (FLAGYL) 500 MG tablet Take 1 tablet (500 mg total) by mouth 2 (two) times daily. One po bid x 7 days 10/02/21  Yes Sherwood Gambler, MD  ?polyethylene glycol (MIRALAX / GLYCOLAX) 17 g packet Take 17 g by mouth daily. 10/02/21  Yes Sherwood Gambler, MD  ?acetaminophen (TYLENOL) 325 MG tablet Take 1-2 tablets (325-650 mg total) by mouth every 4 (four) hours as needed for mild pain. 08/17/21   Setzer, Edman Circle, PA-C  ?amantadine (SYMMETREL) 100 MG capsule Take 1 capsule (100 mg total) by mouth daily. 09/15/21   Ann Held, DO  ?amLODipine (NORVASC) 10 MG tablet Take 1 tablet (10 mg total) by mouth  every morning. 08/18/21   Setzer, Edman Circle, PA-C  ?aspirin 81 MG EC tablet Take 1 tablet (81 mg total) by mouth daily. Swallow whole. 08/18/21   Setzer, Edman Circle, PA-C  ?carbamazepine (TEGRETOL-XR) 100 MG 12 hr tablet Take 1 tablet by mouth every morning 09/15/21   Carollee Herter, Alferd Apa, DO  ?carbamazepine (TEGRETOL-XR) 200 MG 12 hr tablet Take 1 tablet by mouth every evening 09/15/21   Ann Held, DO  ?Cholecalciferol (VITAMIN D) 50 MCG (2000 UT) CAPS Take 2,000 Units by mouth every morning.    [provider]  ?clopidogrel (PLAVIX) 75 MG tablet Take 1 tablet (75 mg total) by mouth daily. 09/15/21   Ann Held, DO  ?fenofibrate 160 MG tablet Take 1 tablet (160 mg total) by mouth at bedtime. 08/17/21   Setzer, Edman Circle, PA-C  ?Flaxseed, Linseed, (FLAXSEED OIL) 1200 MG CAPS Take 1,200 mg by mouth 2 (two) times daily.    [provider]  ?fluticasone (CUTIVATE) 5.42 % cream 1 application daily as needed (psoriasis). 12/16/16   [provider]  ?hydrALAZINE (APRESOLINE) 25 MG tablet Take 1 tablet (25 mg total) by mouth every 8 (eight) hours. 09/15/21   Ann Held, DO  ?lidocaine (LIDODERM) 5 % Place 1 patch onto the skin daily. Remove & Discard patch within 12 hours or as directed by MD 08/18/21  Setzer, Edman Circle, PA-C  ?LORazepam (ATIVAN) 0.5 MG tablet Take 1/2 tablet (0.25 mg total) by mouth daily. 08/18/21   Setzer, Edman Circle, PA-C  ?LORazepam (ATIVAN) 0.5 MG tablet Take 1/2 tablet by mouth every morning and take 1 tablet by mouth at bedtime 09/02/21   Plovsky, Berneta Sages, MD  ?LORazepam (ATIVAN) 1 MG tablet Take 1 tablet (1 mg total) by mouth at bedtime. 09/15/21   Ann Held, DO  ?Multiple Vitamins-Minerals (ADULT ONE DAILY GUMMIES) CHEW Chew 1 tablet by mouth every morning.    [provider]  ?NONFORMULARY OR COMPOUNDED ITEM Lift chair   dx s/p cva 09/08/21   Carollee Herter, Alferd Apa, DO  ?pantoprazole (PROTONIX) 40 MG tablet Take 1 tablet (40 mg total) by mouth  daily. 09/15/21   Ann Held, DO  ?pantoprazole sodium (PROTONIX) 40 mg Place 40 mg into feeding tube daily. 09/15/21   Ann Held, DO  ?tamsulosin (FLOMAX) 0.4 MG CAPS capsule Take 1 capsule (0.4 mg total) by mouth daily after supper. 08/17/21   Setzer, Edman Circle, PA-C  ?traMADol (ULTRAM) 50 MG tablet Take 1 tablet (50 mg total) by mouth every 12 (twelve) hours as needed (back pain). 08/17/21   Setzer, Edman Circle, PA-C  ?traZODone (DESYREL) 150 MG tablet Take 1/2 tablet (75 mg total) by mouth at bedtime. 09/02/21   Plovsky, Berneta Sages, MD  ?triamcinolone cream (KENALOG) 0.1 % Apply 1 application topically 2 (two) times daily. ?Patient taking differently: Apply 1 application. topically 2 (two) times daily as needed (psoriasis). 12/31/20   Ann Held, DO  ?   ? ?Allergies    ?Bee venom, Strawberry extract, Latex, Adhesive [tape], and Statins   ? ?Review of Systems   ?Review of Systems  ?Constitutional:  Negative for fever.  ?Respiratory:  Negative for shortness of breath.   ?Cardiovascular:  Negative for chest pain.  ?Gastrointestinal:  Positive for constipation. Negative for abdominal pain.  ?Neurological:  Positive for light-headedness. Negative for syncope.  ? ?Physical Exam ?Updated Vital Signs ?BP 140/73   Pulse 99   Temp 97.7 ?F (36.5 ?C) (Oral)   Resp (!) 26   SpO2 97%  ?Physical Exam ?Vitals and nursing note reviewed. Exam conducted with a chaperone present.  ?Constitutional:   ?   Appearance: He is well-developed.  ?HENT:  ?   Head: Normocephalic and atraumatic.  ?Cardiovascular:  ?   Rate and Rhythm: Normal rate and regular rhythm.  ?   Heart sounds: Normal heart sounds.  ?Pulmonary:  ?   Effort: Pulmonary effort is normal.  ?   Breath sounds: Normal breath sounds.  ?Abdominal:  ?   General: There is no distension.  ?   Palpations: Abdomen is soft.  ?   Tenderness: There is abdominal tenderness in the right lower quadrant, suprapubic area and left lower quadrant.  ?Genitourinary: ?    Comments: Fecal impaction present.  No gross blood upon disimpaction.  No obvious hemorrhoids. ?Skin: ?   General: Skin is warm and dry.  ?Neurological:  ?   Mental Status: He is alert.  ? ? ?ED Results / Procedures / Treatments   ?Labs ?(all labs ordered are listed, but only abnormal results are displayed) ?Labs Reviewed  ?COMPREHENSIVE METABOLIC PANEL - Abnormal; Notable for the following components:  ?    Result Value  ? CO2 17 (*)   ? Glucose, Bld 146 (*)   ? BUN 24 (*)   ? Creatinine, Ser 1.82 (*)   ?  GFR, Estimated 37 (*)   ? All other components within normal limits  ?CBC WITH DIFFERENTIAL/PLATELET - Abnormal; Notable for the following components:  ? WBC 13.6 (*)   ? RBC 3.53 (*)   ? Hemoglobin 11.2 (*)   ? HCT 34.4 (*)   ? Neutro Abs 11.4 (*)   ? All other components within normal limits  ?CBG MONITORING, ED - Abnormal; Notable for the following components:  ? Glucose-Capillary 146 (*)   ? All other components within normal limits  ? ? ?EKG ?EKG Interpretation ? ?Date/Time:  Friday October 02 2021 17:29:05 EDT ?Ventricular Rate:  78 ?PR Interval:  173 ?QRS Duration: 106 ?QT Interval:  392 ?QTC Calculation: 447 ?R Axis:   -23 ?Text Interpretation: Sinus rhythm Low voltage, precordial leads Abnormal R-wave progression, early transition Left ventricular hypertrophy Confirmed by Sherwood Gambler [phone]) on 10/02/2021 5:40:40 PM ? ?Radiology ?CT ABDOMEN PELVIS W CONTRAST ? ?Result Date: 10/02/2021 ?CLINICAL DATA:  Left lower quadrant abdominal pain. EXAM: CT ABDOMEN AND PELVIS WITH CONTRAST TECHNIQUE: Multidetector CT imaging of the abdomen and pelvis was performed using the standard protocol following bolus administration of intravenous contrast. RADIATION DOSE REDUCTION: This exam was performed according to the departmental dose-optimization program which includes automated exposure control, adjustment of the mA and/or kV according to patient size and/or use of iterative reconstruction technique. CONTRAST:  8m  OMNIPAQUE IOHEXOL 300 MG/ML  SOLN COMPARISON:  July 29, 2021 FINDINGS: Lower chest: No acute abnormality. Hepatobiliary: No focal liver abnormality is seen. Multiple gallstones of various sizes are seen with

## 2021-10-02 NOTE — Discharge Instructions (Addendum)
If you develop worsening, continued, or recurrent abdominal pain, uncontrolled vomiting, fever, chest or back pain, or any other new/concerning symptoms then return to the ER for evaluation.  

## 2021-10-02 NOTE — ED Triage Notes (Signed)
Pt BIB [facility] d/t constipation and near syncopal. Per EMS, Pt was sitting on the toilet trying to have a bowel movement, he has been constipated for 2 days. Felt lightheaded and clammy. Hx CVA. A&O X4.  ?

## 2021-10-02 NOTE — Progress Notes (Signed)
Subjective: ?81 y.o. returns the office today for painful, elongated, thickened toenails which he cannot trim himself.  He has discomfort in the left nail border from ingrown toenail.  Denies any open lesions.  He has no other concerns today to his feet.  Denies any systemic complaints such as fevers, chills, nausea, vomiting.  ? ?PCP: Ann Held, DO ? ?Objective: ?NAD, presents with his wife ?DP/PT pulses palpable, CRT less than 3 seconds  ?Nails hypertrophic, dystrophic, elongated, brittle, discolored x 10.  Incurvation of the left hallux toenail without any edema, erythema or signs of infection.  There is tenderness overlying the nails 1-5 bilaterally. There is no edema, erythema or drainage to the toenail sites today. ?Hammertoes present ?No open lesions or pre-ulcerative lesions are identified. ?No pain with calf compression, swelling, warmth, erythema. ? ?Assessment: ?Patient presents with symptomatic onychomycosis, ingrown toenail ? ?Plan: ?-Treatment options including alternatives, risks, complications were discussed ?-Nails debrided x 10 without any complications or bleeding. In particular short of debrided the ingrown nail without any complications or bleeding.  Symptoms continue consider partial nail avulsion. ?-Continue daily dressing changes for the abrasion.  Monitoring signs or symptoms of infection. ? ?Return in 9 weeks or sooner if needed ? ?Celesta Gentile, DPM ? ? ?

## 2021-10-03 NOTE — ED Notes (Signed)
Patient verbalizes understanding of d/c instructions. Opportunities for questions and answers were provided. Pt d/c from ED and wheeled to lobby with wife.  

## 2021-10-05 ENCOUNTER — Other Ambulatory Visit (HOSPITAL_BASED_OUTPATIENT_CLINIC_OR_DEPARTMENT_OTHER): Payer: Self-pay

## 2021-10-05 ENCOUNTER — Encounter: Payer: PPO | Attending: Physical Medicine and Rehabilitation | Admitting: Physical Medicine and Rehabilitation

## 2021-10-05 DIAGNOSIS — I69359 Hemiplegia and hemiparesis following cerebral infarction affecting unspecified side: Secondary | ICD-10-CM | POA: Insufficient documentation

## 2021-10-05 DIAGNOSIS — K117 Disturbances of salivary secretion: Secondary | ICD-10-CM | POA: Diagnosis not present

## 2021-10-05 MED ORDER — QUETIAPINE FUMARATE 25 MG PO TABS
25.0000 mg | ORAL_TABLET | Freq: Every day | ORAL | 3 refills | Status: DC
  Filled 2021-10-05: qty 30, 30d supply, fill #0

## 2021-10-05 MED ORDER — SCOPOLAMINE 1 MG/3DAYS TD PT72
1.0000 | MEDICATED_PATCH | TRANSDERMAL | 12 refills | Status: DC
  Filled 2021-10-05: qty 10, 30d supply, fill #0

## 2021-10-05 MED ORDER — CARBAMAZEPINE 100 MG/5ML PO SUSP
100.0000 mg | Freq: Three times a day (TID) | ORAL | 12 refills | Status: DC
  Filled 2021-10-05: qty 450, 30d supply, fill #0
  Filled 2021-12-15: qty 450, 30d supply, fill #1
  Filled 2022-01-05 – 2022-01-07 (×2): qty 450, 30d supply, fill #2
  Filled 2022-02-06: qty 450, 30d supply, fill #3

## 2021-10-05 NOTE — Progress Notes (Signed)
? ?  Subjective:  ? ? Patient ID: William [name], male    DOB: [birth year], 81 y.o.   MRN: [id number] ? ?HPI ?An audio/video tele-health visit is felt to be the most appropriate encounter for this patient at this time. This is a follow up tele-visit via phone. The patient is at home. MD is at office. Prior to scheduling this appointment, our staff discussed the limitations of evaluation and management by telemedicine and the availability of in-person appointments. The patient expressed understanding and agreed to proceed.  ? ?1) Anxiety ?William [name] is a 81 year old man who was recently admitted to [facility] with CVA, His wife calls regarding anxiety he has been experiencing between 4:30 and 5pm. At this time he tries to get up and walk around and seems agitated. She has been giving him Xanax '1mg'$  at night and no other anxiety medications ? ?2) CVA ?-he has otherwise been doing really well in his recovery and has been walking well at home! ?-his son visited this week and this seemed to help his recovery ? ?3) Drooling ?-quite a bit ?-runs out of center of his mouth ?-worse at night ? ? ? ?Review of Systems ?+anxiety ?   ?Objective:  ? Physical Exam ? ?Not performed as patient was seen via phone ?   ?Assessment & Plan:  ?William [name] is a 81 year old man recently admitted to [facility] with CVA who presents for follow-up regarding anxiety ? ?1) Anxiety ?-recommended splitting Xanax in half and taking 0.'5mg'$  when he is anxious around 4:30pm and the remaining 0.'5mg'$  at night as he has been ?-recommended eating 2 [place] nuts per day. Discussed that these contain selenium which are also good for hair and nail growth ?-recommended deep breathing, doing activities that relax him during this period of anxiety ? ?2) Drooling ?-prescribed scopolamine patch, discussed potential side effects ?-discussed increasing dose of patch if this does not help ? ?3) Sundowning ?-discussed fall in evening when he wants to go outside ?-discussed that his agitation  at night is hard for her to handle.  ?-prescribed seroquel '25mg'$  HS to use at night as needed.  ? ?13 minutes spent in discussion of his drooling, anxiety, sundowning, trying scopolamine patch for his drooling, Seroquel for his sundowning and agitation, switching carbamazepine to liquid form, choosing nutritious foods given limited appetite ? ?

## 2021-10-06 ENCOUNTER — Other Ambulatory Visit (HOSPITAL_BASED_OUTPATIENT_CLINIC_OR_DEPARTMENT_OTHER): Payer: Self-pay

## 2021-10-07 ENCOUNTER — Encounter: Payer: Self-pay | Admitting: Speech Pathology

## 2021-10-07 ENCOUNTER — Ambulatory Visit: Payer: PPO | Admitting: Physical Therapy

## 2021-10-07 ENCOUNTER — Ambulatory Visit: Payer: PPO | Admitting: Speech Pathology

## 2021-10-07 DIAGNOSIS — I6381 Other cerebral infarction due to occlusion or stenosis of small artery: Secondary | ICD-10-CM | POA: Diagnosis not present

## 2021-10-07 DIAGNOSIS — R262 Difficulty in walking, not elsewhere classified: Secondary | ICD-10-CM

## 2021-10-07 DIAGNOSIS — M6281 Muscle weakness (generalized): Secondary | ICD-10-CM

## 2021-10-07 DIAGNOSIS — R1312 Dysphagia, oropharyngeal phase: Secondary | ICD-10-CM

## 2021-10-07 DIAGNOSIS — R41841 Cognitive communication deficit: Secondary | ICD-10-CM

## 2021-10-07 DIAGNOSIS — R278 Other lack of coordination: Secondary | ICD-10-CM

## 2021-10-07 DIAGNOSIS — R471 Dysarthria and anarthria: Secondary | ICD-10-CM

## 2021-10-07 DIAGNOSIS — R296 Repeated falls: Secondary | ICD-10-CM

## 2021-10-07 DIAGNOSIS — I69354 Hemiplegia and hemiparesis following cerebral infarction affecting left non-dominant side: Secondary | ICD-10-CM

## 2021-10-07 NOTE — Therapy (Signed)
[place] [facility] ?[address] ?[place], [name], [address] ?Phone: [phone]   Fax:  [phone] ? ?Physical Therapy Treatment ? ?Patient Details  ?Name: William [name] ?MRN: [id number] ?Date of Birth: Nov 25, 1940 ?Referring Provider (PT): [place] ? ? ?Encounter Date: 10/07/2021 ? ? PT End of Session - 10/07/21 1150   ? ? Visit Number 2   ? Date for PT Re-Evaluation 12/31/21   ? PT Start Time 1019   ? PT Stop Time 1059   ? PT Time Calculation (min) 40 min   ? Activity Tolerance Patient tolerated treatment well;No increased pain   ? Behavior During Therapy Flat affect;Impulsive   ? ?  ?  ? ?  ? ? ?Past Medical History:  ?Diagnosis Date  ? Arthritis   ? low back  ? 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 Mohs surgery jan 2016   ? Bladder stone   ? BPH (benign prostatic hyperplasia) 08/06/2007  ? Carotid artery occlusion   ? Chronic kidney disease 2014  ? Stage III  ? Closed fracture of fifth metacarpal bone 05/15/2015  ? Eczema   ? Fasting hyperglycemia 12/21/2006  ? GERD (gastroesophageal reflux disease)   ? History of carotid artery stenosis   ? S/P BILATERAL CEA  ? 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 HTN (hypertension) 07/19/2015  ? Hyperlipidemia   ? Hypertension   ? 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 Mild, related to stroke history  ? Nocturia   ? Renal insufficiency 06/25/2013  ? S/P carotid endarterectomy   ? BILATERAL ICA--  PATENT PER DUPLEX  05-19-2012  ? 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 Right calf  ? Urinary frequency   ? Vitamin D deficiency   ? ? ?Past Surgical History:  ?Procedure Laterality Date  ? APPENDECTOMY  AS CHILD  ? CARDIOVASCULAR STRESS TEST  03-27-2012  DR CRENSHAW  ? LOW RISK LEXISCAN STUDY-- PROBABLE NORMAL PERFUSION AND SOFT TISSUE ATTENUATION/  NO ISCHEMIA/ EF 51%  ? CAROTID ENDARTERECTOMY Bilateral LEFT  11-12-2008  DR GREG HAYES  ? RIGHT ICA  2006  ([facility])  ? CYSTOSCOPY W/  RETROGRADES Bilateral 06/22/2021  ? Procedure: CYSTOSCOPY WITH RETROGRADE PYELOGRAM;  Surgeon: Franchot Gallo, MD;  Location[facility];  Service: Urology;  Laterality: Bilateral;  ? CYSTOSCOPY WITH LITHOLAPAXY N/A 02/26/2013  ? Procedure: CYSTOSCOPY WITH LITHOLAPAXY;  Surgeon: Franchot Gallo, MD;  Location: [facility];  Service: Urology;  Laterality: N/A;  ? EYE SURGERY  Jan. 2016  ? cataract surgery both eyes  ? INGUINAL HERNIA REPAIR Right 11-08-2006  ? IR KYPHO EA ADDL LEVEL THORACIC OR LUMBAR  02/12/2021  ? IR RADIOLOGIST EVAL & MGMT  02/18/2021  ? MASS EXCISION N/A 03/03/2016  ? Procedure: EXCISION OF BACK  MASS;  Surgeon: Stark Klein, MD;  Location: [place];  Service: General;  Laterality: N/A;  ? MOHS SURGERY Left 1/ 2016  ? Dr Nevada Crane-- Basal cell  ? PROSTATE SURGERY    ? 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-C N/A 06/22/2021  ? Procedure: TRANSURETHRAL RESECTION OF BLADDER TUMOR;  Surgeon: Franchot Gallo, MD;  Location: [facility];  Service: Urology;  Laterality: N/A;  ? TRANSURETHRAL RESECTION OF PROSTATE N/A 02/26/2013  ? Procedure: TRANSURETHRAL RESECTION OF THE PROSTATE WITH GYRUS INSTRUMENTS;  Surgeon: Franchot Gallo, MD;  Location: [facility];  Service: Urology;  Laterality: N/A;  ?  TRANSURETHRAL RESECTION OF PROSTATE N/A 06/22/2021  ? Procedure: TRANSURETHRAL RESECTION OF THE PROSTATE (TURP);  Surgeon: Franchot Gallo, MD;  Location: [facility];  Service: Urology;  Laterality: N/A;  ? ? ?There were no vitals filed for this visit. ? ? Subjective Assessment - 10/07/21 1023   ? ? Subjective Pt states that things are going ok this morning. His wife noted that he is a little more confused than usual.   ? Currently in Pain? --   states that his Lt lower abdomen is sore  ? ?  ?  ? ?  ? ? ? ? ? ? ? ? ? ? ? ? ? ? ? ? ? ? ? ? [place] Adult PT Treatment/Exercise - 10/07/21 0001   ? ?  ?  Ambulation/Gait  ? Pre-Gait Activities walking in parallel bars to improve reciprocal UE/LE movement-pt had difficulty with foward propulsion and step length despite PT attempts at cuing   ? Gait Comments using FWW: pt ambulating from lobby to gym x3 PT providing MinA-ModA for walker navigation.   ?  ? Lumbar Exercises: Standing  ? Other Standing Lumbar Exercises standing NBOS with 1 UE support in parallel bars, pt instructed to track colored ball and reach/tap with Lt UE   ? Other Standing Lumbar Exercises step ups on 4" box BUE support 2x5 reps- heavy verbal/tactile cuing from PT for foot placement and exercise understanding   ?  ? Lumbar Exercises: Seated  ? Sit to Stand 5 reps   ? Sit to Stand Limitations x2 sets with PT heavy cuing for hand placement and safety   ? Other Seated Lumbar Exercises seated diagonals with visual tracking 2x5 reps- PT providing hand over hand assist for ball placement   ? Other Seated Lumbar Exercises shoulder press #2 x10 reps   ? ?  ?  ? ?  ? ? ? ? ? ? ? ? ? ? PT Education - 10/07/21 1154   ? ? Education Details safety with walker/sit to stand   ? Person(s) Educated Patient;Spouse   ? Methods Explanation;Tactile cues;Verbal cues   ? Comprehension Need further instruction;Tactile cues required;Verbal cues required   ? ?  ?  ? ?  ? ? ? PT Short Term Goals - 10/01/21 1322   ? ?  ? PT SHORT TERM GOAL #1  ? Title independent with initial HEP   ? Time 2   ? Period Weeks   ? Status New   ? ?  ?  ? ?  ? ? ? ? PT Long Term Goals - 10/01/21 1322   ? ?  ? PT LONG TERM GOAL #1  ? Title Understand fall risks, the importance of exercise and flexibility to health and function   ? Time 12   ? Period Weeks   ? Status New   ?  ? PT LONG TERM GOAL #2  ? Title decrease TUG time to 13 seconds   ? Time 12   ? Period Weeks   ? Status New   ?  ? PT LONG TERM GOAL #3  ? Title walk around our building without rest    ? Time 12   ? Period Weeks   ? Status New   ?  ? PT LONG TERM GOAL #4  ? Title increase LE  strength to 4+/5   ? Time 12   ? Period Weeks   ? Status New   ?  ? PT LONG TERM GOAL #  5  ? Title increase Berg blance test score to 46/56   ? Time 12   ? Period Weeks   ? Status New   ? ?  ?  ? ?  ? ? ? ? ? ? ? ? Plan - 10/07/21 1151   ? ? Clinical Impression Statement Per pt?s wife at the start of the session, he was more confused than usual. PT provided close supervision and support throughout the session for safety. Pt had difficulty with walking activity and following instruction to take bigger steps, he was able to keep his Lt hand in contact with the walker but had difficulty keeping the walker in front of him. PT focused on seated and static standing activity to improve his balance, strength and attention to the Lt side of his body. PT also provided education regarding proper hand placement and walker placement throughout the session.   ? Rehab Potential Good   ? PT Frequency 2x / week   ? PT Duration 12 weeks   ? PT Treatment/Interventions ADLs/Self Care Home Management;Electrical Stimulation;Moist Heat;Gait training;Neuromuscular re-education;Balance training;Therapeutic exercise;Therapeutic activities;Functional mobility training;Stair training;Patient/family education;Manual techniques   ? PT Next Visit Plan continue to work on safety/pt is impulsive, work on function and balance   ? Consulted and Agree with Plan of Care Patient;Family member/caregiver   ? Family Member Consulted wife [name]   ? ?  ?  ? ?  ? ? ?Patient will benefit from skilled therapeutic intervention in order to improve the following deficits and impairments:  Abnormal gait, Decreased coordination, Decreased range of motion, Difficulty walking, Increased muscle spasms, Cardiopulmonary status limiting activity, Decreased endurance, Decreased activity tolerance, Pain, Impaired flexibility, Decreased balance, Decreased mobility, Decreased strength, Postural dysfunction ? ?Visit Diagnosis: ?Basal ganglia infarction ([place]) ? ?Hemiplegia and  hemiparesis following cerebral infarction affecting left non-dominant side ([place]) ? ?Other lack of coordination ? ?Difficulty in walking, not elsewhere classified ? ?Muscle weakness (generalized) ? ?Repeated falls

## 2021-10-08 ENCOUNTER — Encounter: Payer: Self-pay | Admitting: Family Medicine

## 2021-10-08 ENCOUNTER — Ambulatory Visit: Payer: PPO | Admitting: Physical Therapy

## 2021-10-08 ENCOUNTER — Ambulatory Visit (INDEPENDENT_AMBULATORY_CARE_PROVIDER_SITE_OTHER): Payer: PPO | Admitting: Family Medicine

## 2021-10-08 ENCOUNTER — Encounter: Payer: Self-pay | Admitting: Physical Therapy

## 2021-10-08 ENCOUNTER — Ambulatory Visit: Payer: PPO | Admitting: Speech Pathology

## 2021-10-08 ENCOUNTER — Other Ambulatory Visit (HOSPITAL_BASED_OUTPATIENT_CLINIC_OR_DEPARTMENT_OTHER): Payer: Self-pay

## 2021-10-08 VITALS — BP 136/98 | HR 89 | Temp 98.3°F | Resp 18 | Ht 71.0 in | Wt 200.2 lb

## 2021-10-08 DIAGNOSIS — B370 Candidal stomatitis: Secondary | ICD-10-CM | POA: Diagnosis not present

## 2021-10-08 DIAGNOSIS — R41841 Cognitive communication deficit: Secondary | ICD-10-CM

## 2021-10-08 DIAGNOSIS — I69354 Hemiplegia and hemiparesis following cerebral infarction affecting left non-dominant side: Secondary | ICD-10-CM

## 2021-10-08 DIAGNOSIS — I6381 Other cerebral infarction due to occlusion or stenosis of small artery: Secondary | ICD-10-CM

## 2021-10-08 DIAGNOSIS — M6281 Muscle weakness (generalized): Secondary | ICD-10-CM

## 2021-10-08 DIAGNOSIS — R278 Other lack of coordination: Secondary | ICD-10-CM

## 2021-10-08 MED ORDER — NYSTATIN 100000 UNIT/ML MT SUSP
5.0000 mL | Freq: Four times a day (QID) | OROMUCOSAL | 0 refills | Status: DC
  Filled 2021-10-08: qty 60, 3d supply, fill #0

## 2021-10-08 NOTE — Patient Instructions (Signed)

## 2021-10-08 NOTE — Therapy (Signed)
?OUTPATIENT SPEECH LANGUAGE PATHOLOGY EVALUATION ? ? ?Patient Name: William [name] ?MRN: [id number] ?DOB:11-24-1940, 81 y.o., male ?Today's Date: 10/08/2021 ? ?PCP: Carollee Herter, Alferd Apa, DO ?REFERRING PROVIDER: Ann Held, DO  ? ? End of Session - 10/08/21 1000   ? ? Visit Number 1   ? Number of Visits 17   ? Date for SLP Re-Evaluation 12/08/21   ? SLP Start Time 1100   ? SLP Stop Time  1145   ? SLP Time Calculation (min) 45 min   ? Activity Tolerance Patient tolerated treatment well;No increased pain   ? ?  ?  ? ?  ? ? ?Past Medical History:  ?Diagnosis Date  ? Arthritis   ? low back  ? 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 Mohs surgery jan 2016   ? Bladder stone   ? BPH (benign prostatic hyperplasia) 08/06/2007  ? Carotid artery occlusion   ? Chronic kidney disease 2014  ? Stage III  ? Closed fracture of fifth metacarpal bone 05/15/2015  ? Eczema   ? Fasting hyperglycemia 12/21/2006  ? GERD (gastroesophageal reflux disease)   ? History of carotid artery stenosis   ? S/P BILATERAL CEA  ? 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 HTN (hypertension) 07/19/2015  ? Hyperlipidemia   ? Hypertension   ? 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 Mild, related to stroke history  ? Nocturia   ? Renal insufficiency 06/25/2013  ? S/P carotid endarterectomy   ? BILATERAL ICA--  PATENT PER DUPLEX  05-19-2012  ? 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 Right calf  ? Urinary frequency   ? Vitamin D deficiency   ? ?Past Surgical History:  ?Procedure Laterality Date  ? APPENDECTOMY  AS CHILD  ? CARDIOVASCULAR STRESS TEST  03-27-2012  DR CRENSHAW  ? LOW RISK LEXISCAN STUDY-- PROBABLE NORMAL PERFUSION AND SOFT TISSUE ATTENUATION/  NO ISCHEMIA/ EF 51%  ? CAROTID ENDARTERECTOMY Bilateral LEFT  11-12-2008  DR GREG HAYES  ? RIGHT ICA  2006  ([facility])  ? CYSTOSCOPY W/ RETROGRADES Bilateral 06/22/2021  ? Procedure: CYSTOSCOPY WITH RETROGRADE PYELOGRAM;  Surgeon: Franchot Gallo, MD;   Location: [facility];  Service: Urology;  Laterality: Bilateral;  ? CYSTOSCOPY WITH LITHOLAPAXY N/A 02/26/2013  ? Procedure: CYSTOSCOPY WITH LITHOLAPAXY;  Surgeon: Franchot Gallo, MD;  Location: [facility];  Service: Urology;  Laterality: N/A;  ? EYE SURGERY  Jan. 2016  ? cataract surgery both eyes  ? INGUINAL HERNIA REPAIR Right 11-08-2006  ? IR KYPHO EA ADDL LEVEL THORACIC OR LUMBAR  02/12/2021  ? IR RADIOLOGIST EVAL & MGMT  02/18/2021  ? MASS EXCISION N/A 03/03/2016  ? Procedure: EXCISION OF BACK  MASS;  Surgeon: Stark Klein, MD;  Location: [place];  Service: General;  Laterality: N/A;  ? MOHS SURGERY Left 1/ 2016  ? Dr Nevada Crane-- Basal cell  ? PROSTATE SURGERY    ? 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-C N/A 06/22/2021  ? Procedure: TRANSURETHRAL RESECTION OF BLADDER TUMOR;  Surgeon: Franchot Gallo, MD;  Location: [facility];  Service: Urology;  Laterality: N/A;  ? TRANSURETHRAL RESECTION OF PROSTATE N/A 02/26/2013  ? Procedure: TRANSURETHRAL RESECTION OF THE PROSTATE WITH GYRUS INSTRUMENTS;  Surgeon: Franchot Gallo, MD;  Location: [facility];  Service: Urology;  Laterality: N/A;  ? TRANSURETHRAL RESECTION OF PROSTATE N/A 06/22/2021  ? Procedure: TRANSURETHRAL RESECTION OF THE  PROSTATE (TURP);  Surgeon: Franchot Gallo, MD;  Location: [facility];  Service: Urology;  Laterality: N/A;  ? ?Patient Active Problem List  ? Diagnosis Date Noted  ? Hemiplegia, dominant side S/P CVA (cerebrovascular accident) ([place]) 09/11/2021  ? Insomnia   ? Prediabetes   ? Chronic kidney disease (CKD), stage IV (severe) ([facility])   ? 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 Transaminitis 07/27/2021  ? UTI (urinary tract infection) 07/27/2021  ? CVA (cerebral vascular accident) ([place]) 07/27/2021  ? Fall at home, initial encounter 07/27/2021  ? Hyperglycemia 07/27/2021  ? CKD (chronic kidney disease), stage IV ([place])  07/27/2021  ? Cholelithiasis 07/27/2021  ? Hypoxia 07/27/2021  ? Nausea and vomiting 07/27/2021  ? Acute metabolic encephalopathy 85/63/1497  ? Normocytic anemia 07/27/2021  ? Chronic back pain 07/27/2021  ? 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[place]) 06/22/2021  ? Closed fracture of first lumbar vertebra with routine healing 02/03/2021  ? Closed fracture of multiple ribs 11/18/2020  ? Anxiety 01/29/2020  ? Leg pain, bilateral 01/29/2020  ? Ingrown toenail 07/13/2019  ? Lumbar spondylosis 05/02/2018  ? Pain in left knee 03/09/2018  ? 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 Trochanteric bursitis of left hip 01/16/2018  ? Preventative health care 09/26/2017  ? HTN (hypertension) 07/19/2015  ? Hyperlipidemia 07/19/2015  ? Great toe pain 02/11/2014  ? 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 Obesity (BMI 30-39.9) 06/25/2013  ? Renal insufficiency 06/25/2013  ? Weakness of left arm 06/25/2013  ? Sebaceous cyst 03/03/2011  ? Sprain of lumbar region 07/31/2010  ? Rib pain, left 08/29/2009  ? Carotid artery stenosis, asymptomatic, bilateral 05/02/2009  ? Eczema, atopic 05/31/2008  ? Vitamin D deficiency 03/01/2008  ? BPH (benign prostatic hyperplasia) 08/06/2007  ? Fasting hyperglycemia 12/21/2006  ? History of right MCA infarct 2006  ? ? ?ONSET DATE: 07/27/21  ? ?REFERRING DIAG: I63.9 (ICD-10-CM) - Cerebrovascular accident (CVA), unspecified mechanism ([place])  ? ?THERAPY DIAG:  ?Cognitive communication deficit ? ?SUBJECTIVE:  ? ?SUBJECTIVE STATEMENT: ?Pt was pleasant and cooperative throughout evaluation.  ?Pt accompanied by: significant other, wife ? ?PERTINENT HISTORY:  William [name] is a 81 y.o. male with medical history significant of hypertension, hyperlipidemia, CVA with residual left-sided hemiplegia in 2006, carotid artery stenosis s/p bilateral CEA, CKD stage IV, BPH, bladder cancer s/p [place], and GERD presents with complaints of vomiting and fall. ? ?PAIN:  ?Are you having pain?  No ? ? ?FALLS: Has patient fallen in last 6 months?  See PT evaluation for details ? ?LIVING ENVIRONMENT: ?Lives with: lives with their spouse ?Lives in: House/apartment ? ?PLOF:  ?Level of assistance: Needed assistance with ADLs, Needed assistance with IADLS ?Employment: Retired ? ? ?PATIENT GOALS Orientation, communication ? ?OBJECTIVE:  ? ? ?COGNITION: ?Overall cognitive status: Impaired: Attention: Impaired: Focused, Sustained, Selective, Alternating, Divided, Memory: Impaired: Immediate ?Working ?Short term ?Procedural ?Prospective ?Auditory ?Visual, Awareness: Impaired: Emergent, Executive function: Impaired: Problem solving, Organization, Planning, Error awareness, Self-correction, and Slow processing, and Behavior: Perseveration ? ?COGNITIVE COMMUNICATION ?Following directions: Follows one step commands inconsistently  ?Auditory comprehension: Impaired: following directions  ?Verbal expression: WFL; some word finding difficulties, likely due to cognitive impairment ?Functional communication: Impaired: 2/2 impaired cognition ? ?ORAL MOTOR EXAMINATION ?Facial : WFL ?Lingual: WFL ?Velum: WFL ?Mandible: WFL ?Cough: WFL ?Voice: Strained, Breathy, Weak ? ?STANDARDIZED ASSESSMENTS: ?CLQT: Attention: Severe, Memory: Severe, Executive Function: Severe, Language: Severe, Visuospatial Skills: Severe, and Clock Drawing: Severe ? ? PATIENT REPORTED OUTCOME MEASURES (PROM): ?NA ? ? ? ?  PATIENT EDUCATION: ?Education details: Cognitive-communication impairment ?Person educated: Patient and Spouse ?Education method: Explanation and Demonstration ?Education comprehension: verbalized understanding, verbal cues required, and needs further education ? ? ? ? ?GOALS: ?Goals reviewed with patient? Yes ? ?SHORT TERM GOALS: Target date:  ? ?Patient will demonstrate orientation x4 with modA verbal cues to refer to compensatory aids. ?Baseline: ?Goal status: INITIAL ? ?2.  Patient/wife will recall safe swallow strategies to use  at home to increase safety with consuming food/drink PO with minA.  ?Baseline:  ?Goal status: INITIAL ? ?3.  Pt will comprehend 1-step directions with 80% accuracy given less than 2 verbal cues. ?Baseline:  ?Goal statu

## 2021-10-08 NOTE — Therapy (Signed)
[facility] ?[place], [name], [address] ?Phone: [phone]   Fax:  [phone] ? ?Physical Therapy Treatment ? ?Patient Details  ?Name: William [name] ?MRN: [id number] ?Date of Birth: Aug 04, 1940 ?Referring Provider (PT): [place] ? ? ?Encounter Date: 10/08/2021 ? ? PT End of Session - 10/08/21 1122   ? ? Visit Number 3   ? Date for PT Re-Evaluation 12/31/21   ? PT Start Time 1016   ? PT Stop Time 1058   ? PT Time Calculation (min) 42 min   ? Equipment Utilized During Treatment Gait belt   ? Activity Tolerance Patient tolerated treatment well;No increased pain   ? Behavior During Therapy Flat affect;Impulsive   ? ?  ?  ? ?  ? ? ?Past Medical History:  ?Diagnosis Date  ? Arthritis   ? low back  ? 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 Mohs surgery jan 2016   ? Bladder stone   ? BPH (benign prostatic hyperplasia) 08/06/2007  ? Carotid artery occlusion   ? Chronic kidney disease 2014  ? Stage III  ? Closed fracture of fifth metacarpal bone 05/15/2015  ? Eczema   ? Fasting hyperglycemia 12/21/2006  ? GERD (gastroesophageal reflux disease)   ? History of carotid artery stenosis   ? S/P BILATERAL CEA  ? 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 HTN (hypertension) 07/19/2015  ? Hyperlipidemia   ? Hypertension   ? 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 Mild, related to stroke history  ? Nocturia   ? Renal insufficiency 06/25/2013  ? S/P carotid endarterectomy   ? BILATERAL ICA--  PATENT PER DUPLEX  05-19-2012  ? 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 Right calf  ? Urinary frequency   ? Vitamin D deficiency   ? ? ?Past Surgical History:  ?Procedure Laterality Date  ? APPENDECTOMY  AS CHILD  ? CARDIOVASCULAR STRESS TEST  03-27-2012  DR CRENSHAW  ? LOW RISK LEXISCAN STUDY-- PROBABLE NORMAL PERFUSION AND SOFT TISSUE ATTENUATION/  NO ISCHEMIA/ EF 51%  ? CAROTID ENDARTERECTOMY Bilateral LEFT  11-12-2008  DR GREG HAYES   ? RIGHT ICA  2006  ([facility])  ? CYSTOSCOPY W/ RETROGRADES Bilateral 06/22/2021  ? Procedure: CYSTOSCOPY WITH RETROGRADE PYELOGRAM;  Surgeon: Franchot Gallo, MD;  Location: [facility];  Service: Urology;  Laterality: Bilateral;  ? CYSTOSCOPY WITH LITHOLAPAXY N/A 02/26/2013  ? Procedure: CYSTOSCOPY WITH LITHOLAPAXY;  Surgeon: Franchot Gallo, MD;  Location: [facility];  Service: Urology;  Laterality: N/A;  ? EYE SURGERY  Jan. 2016  ? cataract surgery both eyes  ? INGUINAL HERNIA REPAIR Right 11-08-2006  ? IR KYPHO EA ADDL LEVEL THORACIC OR LUMBAR  02/12/2021  ? IR RADIOLOGIST EVAL & MGMT  02/18/2021  ? MASS EXCISION N/A 03/03/2016  ? Procedure: EXCISION OF BACK  MASS;  Surgeon: Stark Klein, MD;  Location: [facility];  Service: General;  Laterality: N/A;  ? MOHS SURGERY Left 1/ 2016  ? Dr Nevada Crane-- Basal cell  ? PROSTATE SURGERY    ? 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-C N/A 06/22/2021  ? Procedure: TRANSURETHRAL RESECTION OF BLADDER TUMOR;  Surgeon: Franchot Gallo, MD;  Location: [facility];  Service: Urology;  Laterality: N/A;  ? TRANSURETHRAL RESECTION OF PROSTATE N/A 02/26/2013  ? Procedure: TRANSURETHRAL RESECTION OF THE PROSTATE WITH GYRUS INSTRUMENTS;  Surgeon: Franchot Gallo, MD;  Location: [facility];  Service: Urology;  Laterality: N/A;  ? TRANSURETHRAL RESECTION OF PROSTATE N/A 06/22/2021  ? Procedure: TRANSURETHRAL RESECTION OF THE PROSTATE (TURP);  Surgeon: Franchot Gallo, MD;  Location: [facility];  Service: Urology;  Laterality: N/A;  ? ? ?There were no vitals filed for this visit. ? ? Subjective Assessment - 10/08/21 1025   ? ? Subjective No pain, wife reporsts nothing new.   ? Pertinent History CVA's   ? Limitations Standing;Walking;House hold activities   ? Patient Stated Goals walk beter, move better, no falls   ? Currently in Pain? No/denies   ? ?  ?  ? ?   ? ? ? ? ? ? ? ? ? ? ? ? ? ? ? ? ? ? ? ? [facility] Adult PT Treatment/Exercise - 10/08/21 0001   ? ?  ? Ambulation/Gait  ? Gait Comments ambulated 125 ft x4 HHA, RW, walking stick. VC and tactile cueing to work on cadence and step length.   overall did well w/ cueing CGA. fatigued. Patient needed less assistance w/ balance.  ?  ? Lumbar Exercises: Aerobic  ? Nustep lvl 4 x6 mins   ?  ? Lumbar Exercises: Machines for Strengthening  ? Cybex Knee Extension 2x10 5# B LE; L LE only x5 5#   ? Cybex Knee Flexion 2x10 25#   ?  ? Lumbar Exercises: Standing  ? Other Standing Lumbar Exercises step to and step through fwd on the agility ladder x4; ;lateral step to on agility ladder x4.   ? Other Standing Lumbar Exercises Step ups on 6' step both sides x5; toe taps on 6' step  2x10 B LE   ?  ? Lumbar Exercises: Seated  ? Other Seated Lumbar Exercises STS w/ chest press yellow weight ball x10; STS w/ ball throw x10   ? ?  ?  ? ?  ? ? ? ? ? ? ? ? ? ? ? ? PT Short Term Goals - 10/01/21 1322   ? ?  ? PT SHORT TERM GOAL #1  ? Title independent with initial HEP   ? Time 2   ? Period Weeks   ? Status New   ? ?  ?  ? ?  ? ? ? ? PT Long Term Goals - 10/08/21 1125   ? ?  ? PT LONG TERM GOAL #1  ? Title Understand fall risks, the importance of exercise and flexibility to health and function   ? Time 12   ? Period Weeks   ? Status On-going   ?  ? PT LONG TERM GOAL #3  ? Title walk around our building without rest    ? Time 12   ? Period Weeks   ? Status On-going   ?  ? PT LONG TERM GOAL #4  ? Title increase LE strength to 4+/5   ? Time 12   ? Period Weeks   ? Status On-going   ? ?  ?  ? ?  ? ? ? ? ? ? ? ? Plan - 10/08/21 1126   ? ? Clinical Impression Statement Patient came in doing well. Gait belt utilized during entire session. Focused on strengthening, balance, and gait. He seemed to have good balance during gait w/ RW. VC's and tactile cues needed to slow down movements, correct movements, and to increase step length and cadence. No  reciprical arm swing on the L UE.   ? Stability/Clinical Decision Making Evolving/Moderate complexity   ?  Clinical Decision Making Moderate   ? Rehab Potential Good   ? PT Frequency 2x / week   ? PT Duration 12 weeks   ? PT Treatment/Interventions ADLs/Self Care Home Management;Electrical Stimulation;Moist Heat;Gait training;Neuromuscular re-education;Balance training;Therapeutic exercise;Therapeutic activities;Functional mobility training;Stair training;Patient/family education;Manual techniques   ? PT Next Visit Plan continue to work on safety/pt is impulsive, work on function and balance   ? Consulted and Agree with Plan of Care Patient;Family member/caregiver   ? Family Member Consulted wife [name]   ? ?  ?  ? ?  ? ? ?Patient will benefit from skilled therapeutic intervention in order to improve the following deficits and impairments:  Abnormal gait, Decreased coordination, Decreased range of motion, Difficulty walking, Increased muscle spasms, Cardiopulmonary status limiting activity, Decreased endurance, Decreased activity tolerance, Pain, Impaired flexibility, Decreased balance, Decreased mobility, Decreased strength, Postural dysfunction ? 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 ?Hemiplegia and hemiparesis following cerebral infarction affecting left non-dominant side ([place]) ? ?Other lack of coordination ? ?Muscle weakness (generalized) ? ?Cognitive communication deficit ? ? ? ? ?Problem List ?Patient Active Problem List  ? Diagnosis Date Noted  ? Hemiplegia, dominant side S/P CVA (cerebrovascular accident) ([place]) 09/11/2021  ? Insomnia   ? Prediabetes   ? Chronic kidney disease (CKD), stage IV (severe) (HCC)   ? 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 Transaminitis 07/27/2021  ? UTI (urinary tract infection) 07/27/2021  ? CVA (cerebral vascular accident) ([place]) 07/27/2021  ? Fall at home, initial encounter 07/27/2021  ? Hyperglycemia 07/27/2021  ? CKD (chronic kidney disease), stage IV ([place])  07/27/2021  ? Cholelithiasis 07/27/2021  ? Hypoxia 07/27/2021  ? Nausea and vomiting 07/27/2021  ? Acute metabolic encephalopathy 09/73/5329  ? Normocytic anemia 07/27/2021  ? Chronic back pain 07/27/2021  ? Malignant neoplasm of overlap

## 2021-10-08 NOTE — Progress Notes (Addendum)
? ?Subjective:  ? ?By signing my name below, I, William [name], attest that this documentation has been prepared under the direction and in the presence of Ann Held, DO. 10/08/2021 ? ? ? Patient ID: William [name], male    DOB: 04/15/1941, 81 y.o.   MRN: [id number] ? ?Chief Complaint  ?Patient presents with  ? Thrush  ?  X2 weeks ago patient started drooling and states pt has been on antibiotics freq and pt's wife thinks he had thrush   ? ? ?HPI ?Patient is in today for a office visit. His wife is present during this visit.  ? ?He complains of sore throat and thrush. His tongue does not hurt. He has mild pain while swallowing. His sore throat is worse in the morning. He denies having any fever. His wife reports that he is "pocketing" his food in his cheek and does not want to swallow it. She also reports when he started drooling he refused to drink water. He reports that water does not taste good anymore. His wife has tried giving him lemon crystal light as an alternative but he did not like it. He is on anti-biotics to manage his colitis at this time. He cannot gargle liquids but he can swish and spit.  ?His wife reports he has lost weight and is down to 200 lb's. She notes this is mainly due to not eating as much. He reports nothing tastes the same anymore.  ?Wt Readings from Last 3 Encounters:  ?10/08/21 200 lb 3.2 oz (90.8 kg)  ?08/27/21 206 lb (93.4 kg)  ?08/12/21 201 lb 8 oz (91.4 kg)  ? ?He is currently taking miralax to help manage his colitis symptoms and his wife reports he is going to the bathroom regularly. He has a couple of more days before he is done with his anti-biotic course. He denies having any abdominal pain.  ? ? ?Past Medical History:  ?Diagnosis Date  ? Arthritis   ? low back  ? 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 Mohs surgery jan 2016   ? Bladder stone   ? BPH (benign prostatic hyperplasia) 08/06/2007  ? Carotid artery occlusion   ? Chronic kidney disease 2014  ? Stage III   ? Closed fracture of fifth metacarpal bone 05/15/2015  ? Eczema   ? Fasting hyperglycemia 12/21/2006  ? GERD (gastroesophageal reflux disease)   ? History of carotid artery stenosis   ? S/P BILATERAL CEA  ? 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 HTN (hypertension) 07/19/2015  ? Hyperlipidemia   ? Hypertension   ? 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 Mild, related to stroke history  ? Nocturia   ? Renal insufficiency 06/25/2013  ? S/P carotid endarterectomy   ? BILATERAL ICA--  PATENT PER DUPLEX  05-19-2012  ? 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 Right calf  ? Urinary frequency   ? Vitamin D deficiency   ? ? ?Past Surgical History:  ?Procedure Laterality Date  ? APPENDECTOMY  AS CHILD  ? CARDIOVASCULAR STRESS TEST  03-27-2012  DR CRENSHAW  ? LOW RISK LEXISCAN STUDY-- PROBABLE NORMAL PERFUSION AND SOFT TISSUE ATTENUATION/  NO ISCHEMIA/ EF 51%  ? CAROTID ENDARTERECTOMY Bilateral LEFT  11-12-2008  DR GREG HAYES  ? RIGHT ICA  2006  ([facility])  ? CYSTOSCOPY W/ RETROGRADES Bilateral 06/22/2021  ? Procedure: CYSTOSCOPY WITH RETROGRADE PYELOGRAM;  Surgeon: Franchot Gallo, MD;  Location: [facility];  Service: Urology;  Laterality: Bilateral;  ? CYSTOSCOPY WITH LITHOLAPAXY N/A 02/26/2013  ? Procedure: CYSTOSCOPY WITH LITHOLAPAXY;  Surgeon: Franchot Gallo, MD;  Location: [facility];  Service: Urology;  Laterality: N/A;  ? EYE SURGERY  Jan. 2016  ? cataract surgery both eyes  ? INGUINAL HERNIA REPAIR Right 11-08-2006  ? IR KYPHO EA ADDL LEVEL THORACIC OR LUMBAR  02/12/2021  ? IR RADIOLOGIST EVAL & MGMT  02/18/2021  ? MASS EXCISION N/A 03/03/2016  ? Procedure: EXCISION OF BACK  MASS;  Surgeon: Stark Klein, MD;  Location: [facility];  Service: General;  Laterality: N/A;  ? MOHS SURGERY Left 1/ 2016  ? Dr Nevada Crane-- Basal cell  ? PROSTATE SURGERY    ? 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-C N/A 06/22/2021  ? Procedure:  TRANSURETHRAL RESECTION OF BLADDER TUMOR;  Surgeon: Franchot Gallo, MD;  Location: [facility];  Service: Urology;  Laterality: N/A;  ? TRANSURETHRAL RESECTION OF PROSTATE N/A 02/26/2013  ? Procedure: TRANSURETHRAL RESECTION OF THE PROSTATE WITH GYRUS INSTRUMENTS;  Surgeon: Franchot Gallo, MD;  Location: [facility];  Service: Urology;  Laterality: N/A;  ? TRANSURETHRAL RESECTION OF PROSTATE N/A 06/22/2021  ? Procedure: TRANSURETHRAL RESECTION OF THE PROSTATE (TURP);  Surgeon: Franchot Gallo, MD;  Location: [facility];  Service: Urology;  Laterality: N/A;  ? ? ?Family History  ?Problem Relation Age of Onset  ? Heart disease Mother   ?     CHF  ? Bipolar disorder Mother   ? Heart disease Father   ?     CHF  ? ? ?Social History  ? ?Socioeconomic History  ? Marital status: Married  ?  Spouse name: Not on file  ? Number of children: 2  ? Years of education: 70  ? Highest education level: High school graduate  ?Occupational History  ?  Employer: Retired  ?Tobacco Use  ? Smoking status: Former  ?  Packs/day: 2.00  ?  Years: 40.00  ?  Pack years: 80.00  ?  Types: Cigarettes  ?  Quit date: 02/15/2005  ?  Years since quitting: 16.6  ? Smokeless tobacco: Never  ?Vaping Use  ? Vaping Use: Never used  ?Substance and Sexual Activity  ? Alcohol use: Yes  ?  Alcohol/week: 0.0 standard drinks  ?  Comment: Occasional  ? Drug use: No  ? Sexual activity: Yes  ?  Partners: Female  ?Other Topics Concern  ? Not on file  ?Social History Narrative  ? Exercise--  Walks dogs everyday  ?   ? LIves with wife , no stairs in home, caffeine - one cup coffee day, exercise - not much, Right handed, 12th grade, retired  ?   ? One story home  ? ?Social Determinants of Health  ? ?Financial Resource Strain: Not on file  ?Food Insecurity: Not on file  ?Transportation Needs: Not on file  ?Physical Activity: Not on file  ?Stress: Not on file  ?Social Connections: Not on file  ?Intimate Partner Violence:  Not on file  ? ? ?Outpatient Medications Prior to Visit  ?Medication Sig Dispense Refill  ? acetaminophen (TYLENOL) 325 MG tablet Take 1-2 tablets (325-650 mg total) by mouth every 4 (four) hours as needed for mild pain.    ? amantadine (SYMMETREL) 100 MG capsule Take 1 capsule (100 mg total) by mouth daily. 30 capsule 0  ? amLODipine (NORVASC) 10 MG tablet Take 1 tablet (10 mg total)  by mouth every morning. 30 tablet 0  ? aspirin 81 MG EC tablet Take 1 tablet (81 mg total) by mouth daily. Swallow whole. 30 tablet 11  ? carBAMazepine (TEGRETOL) 100 MG/5ML suspension Take 5 mLs (100 mg total) by mouth 3 (three) times daily. 450 mL 12  ? Cholecalciferol (VITAMIN D) 50 MCG (2000 UT) CAPS Take 2,000 Units by mouth every morning.    ? ciprofloxacin (CIPRO) 500 MG tablet Take 1 tablet (500 mg total) by mouth every 12 (twelve) hours. 13 tablet 0  ? clopidogrel (PLAVIX) 75 MG tablet Take 1 tablet (75 mg total) by mouth daily. 30 tablet 0  ? docusate sodium (COLACE) 100 MG capsule Take 1 capsule (100 mg total) by mouth every 12 (twelve) hours. 60 capsule 0  ? fenofibrate 160 MG tablet Take 1 tablet (160 mg total) by mouth at bedtime. 30 tablet 0  ? Flaxseed, Linseed, (FLAXSEED OIL) 1200 MG CAPS Take 1,200 mg by mouth 2 (two) times daily.    ? fluticasone (CUTIVATE) 1.96 % cream 1 application daily as needed (psoriasis).  3  ? hydrALAZINE (APRESOLINE) 25 MG tablet Take 1 tablet (25 mg total) by mouth every 8 (eight) hours. 90 tablet 0  ? lidocaine (LIDODERM) 5 % Place 1 patch onto the skin daily. Remove & Discard patch within 12 hours or as directed by MD 15 patch 1  ? LORazepam (ATIVAN) 0.5 MG tablet Take 1/2 tablet (0.25 mg total) by mouth daily. 30 tablet 0  ? LORazepam (ATIVAN) 0.5 MG tablet Take 1/2 tablet by mouth every morning and take 1 tablet by mouth at bedtime 135 tablet 1  ? LORazepam (ATIVAN) 1 MG tablet Take 1 tablet (1 mg total) by mouth at bedtime. 30 tablet 1  ? metroNIDAZOLE (FLAGYL) 500 MG tablet Take 1  tablet (500 mg total) by mouth 2 (two) times daily. One po bid x 7 days 13 tablet 0  ? Multiple Vitamins-Minerals (ADULT ONE DAILY GUMMIES) CHEW Chew 1 tablet by mouth every morning.    ? NONFORMULARY OR

## 2021-10-08 NOTE — Assessment & Plan Note (Signed)
Nystatin swish and spit  ?This may be why things are not tasting right to him but encouraged his wife to d/w neuro as well  ?Dilute apple juice with water or other juices to give water flavor ---also can try HINT water  ? ?

## 2021-10-09 ENCOUNTER — Encounter: Payer: Self-pay | Admitting: Speech Pathology

## 2021-10-09 ENCOUNTER — Other Ambulatory Visit: Payer: Self-pay | Admitting: Family Medicine

## 2021-10-09 DIAGNOSIS — R414 Neurologic neglect syndrome: Secondary | ICD-10-CM

## 2021-10-09 NOTE — Therapy (Signed)
?OUTPATIENT SPEECH LANGUAGE PATHOLOGY TREATMENT NOTE ? ? ?Patient Name: William [name] ?MRN: [id number] ?DOB:02-Jul-1940, 81 y.o., male ?Today's Date: 10/09/2021 ? ?PCP: Ann Held, DO  ?REFERRING PROVIDER: Ann Held, DO  ? ?END OF SESSION:  ? End of Session - 10/09/21 0932   ? ? Visit Number 2   ? Number of Visits 17   ? Date for SLP Re-Evaluation 12/08/21   ? SLP Start Time 1100   ? SLP Stop Time  1140   ? SLP Time Calculation (min) 40 min   ? Activity Tolerance Patient tolerated treatment well;No increased pain   ? ?  ?  ? ?  ? ? ?Past Medical History:  ?Diagnosis Date  ? Arthritis   ? low back  ? 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 Mohs surgery jan 2016   ? Bladder stone   ? BPH (benign prostatic hyperplasia) 08/06/2007  ? Carotid artery occlusion   ? Chronic kidney disease 2014  ? Stage III  ? Closed fracture of fifth metacarpal bone 05/15/2015  ? Eczema   ? Fasting hyperglycemia 12/21/2006  ? GERD (gastroesophageal reflux disease)   ? History of carotid artery stenosis   ? S/P BILATERAL CEA  ? 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 HTN (hypertension) 07/19/2015  ? Hyperlipidemia   ? Hypertension   ? 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 Mild, related to stroke history  ? Nocturia   ? Renal insufficiency 06/25/2013  ? S/P carotid endarterectomy   ? BILATERAL ICA--  PATENT PER DUPLEX  05-19-2012  ? 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 Right calf  ? Urinary frequency   ? Vitamin D deficiency   ? ?Past Surgical History:  ?Procedure Laterality Date  ? APPENDECTOMY  AS CHILD  ? CARDIOVASCULAR STRESS TEST  03-27-2012  DR CRENSHAW  ? LOW RISK LEXISCAN STUDY-- PROBABLE NORMAL PERFUSION AND SOFT TISSUE ATTENUATION/  NO ISCHEMIA/ EF 51%  ? CAROTID ENDARTERECTOMY Bilateral LEFT  11-12-2008  DR GREG HAYES  ? RIGHT ICA  2006  ([facility])  ? CYSTOSCOPY W/ RETROGRADES Bilateral 06/22/2021  ? Procedure: CYSTOSCOPY WITH RETROGRADE PYELOGRAM;  Surgeon:  Franchot Gallo, MD;  Location: [facility];  Service: Urology;  Laterality: Bilateral;  ? CYSTOSCOPY WITH LITHOLAPAXY N/A 02/26/2013  ? Procedure: CYSTOSCOPY WITH LITHOLAPAXY;  Surgeon: Franchot Gallo, MD;  Location: [facility];  Service: Urology;  Laterality: N/A;  ? EYE SURGERY  Jan. 2016  ? cataract surgery both eyes  ? INGUINAL HERNIA REPAIR Right 11-08-2006  ? IR KYPHO EA ADDL LEVEL THORACIC OR LUMBAR  02/12/2021  ? IR RADIOLOGIST EVAL & MGMT  02/18/2021  ? MASS EXCISION N/A 03/03/2016  ? Procedure: EXCISION OF BACK  MASS;  Surgeon: Stark Klein, MD;  Location: [facility];  Service: General;  Laterality: N/A;  ? MOHS SURGERY Left 1/ 2016  ? Dr Nevada Crane-- Basal cell  ? PROSTATE SURGERY    ? 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-C N/A 06/22/2021  ? Procedure: TRANSURETHRAL RESECTION OF BLADDER TUMOR;  Surgeon: Franchot Gallo, MD;  Location: [facility];  Service: Urology;  Laterality: N/A;  ? TRANSURETHRAL RESECTION OF PROSTATE N/A 02/26/2013  ? Procedure: TRANSURETHRAL RESECTION OF THE PROSTATE WITH GYRUS INSTRUMENTS;  Surgeon: Franchot Gallo, MD;  Location: [facility];  Service: Urology;  Laterality: N/A;  ? TRANSURETHRAL RESECTION OF PROSTATE N/A 06/22/2021  ?  Procedure: TRANSURETHRAL RESECTION OF THE PROSTATE (TURP);  Surgeon: Franchot Gallo, MD;  Location: [facility];  Service: Urology;  Laterality: N/A;  ? ?Patient Active Problem List  ? Diagnosis Date Noted  ? Thrush 10/08/2021  ? Hemiplegia, dominant side S/P CVA (cerebrovascular accident) ([place]) 09/11/2021  ? Insomnia   ? Prediabetes   ? Chronic kidney disease (CKD), stage IV (severe) (HCC)   ? Basal ganglia infarction ([place]) 07/29/2021  ? Transaminitis 07/27/2021  ? UTI (urinary tract infection) 07/27/2021  ? CVA (cerebral vascular accident) ([place]) 07/27/2021  ? Fall at home, initial encounter 07/27/2021  ? Hyperglycemia 07/27/2021  ? CKD  (chronic kidney disease), stage IV ([place]) 07/27/2021  ? Cholelithiasis 07/27/2021  ? Hypoxia 07/27/2021  ? Nausea and vomiting 07/27/2021  ? Acute metabolic encephalopathy 02/54/2706  ? Normocytic anemia 07/27/2021  ? Chronic back pain 07/27/2021  ? 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[place]) 06/22/2021  ? Closed fracture of first lumbar vertebra with routine healing 02/03/2021  ? Closed fracture of multiple ribs 11/18/2020  ? Anxiety 01/29/2020  ? Leg pain, bilateral 01/29/2020  ? Ingrown toenail 07/13/2019  ? Lumbar spondylosis 05/02/2018  ? Pain in left knee 03/09/2018  ? 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 Trochanteric bursitis of left hip 01/16/2018  ? Preventative health care 09/26/2017  ? HTN (hypertension) 07/19/2015  ? Hyperlipidemia 07/19/2015  ? Great toe pain 02/11/2014  ? 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 Obesity (BMI 30-39.9) 06/25/2013  ? Renal insufficiency 06/25/2013  ? Weakness of left arm 06/25/2013  ? Sebaceous cyst 03/03/2011  ? Sprain of lumbar region 07/31/2010  ? Rib pain, left 08/29/2009  ? Carotid artery stenosis, asymptomatic, bilateral 05/02/2009  ? Eczema, atopic 05/31/2008  ? Vitamin D deficiency 03/01/2008  ? BPH (benign prostatic hyperplasia) 08/06/2007  ? Fasting hyperglycemia 12/21/2006  ? History of right MCA infarct 2006  ? ? ?ONSET DATE: 07/27/21 ? ?REFERRING DIAG: I63.9 (ICD-10-CM) - Cerebrovascular accident (CVA), unspecified mechanism ([place])  ? ?THERAPY DIAG:  ?Cognitive communication deficit ? ?SUBJECTIVE: Pt was more alert and less confused today. ? ?PAIN:  ?Are you having pain? No ? ? ? ? ?OBJECTIVE:  ? ?TODAY'S TREATMENT: ?Pt was seen for skilled ST services with focus on cognitive-communication goals. Pt was oriented today to time, but was not oriented to his complete birthday. He had difficulty with verbalizing the day of his birthday, but when written down and asked to place his "day" in the blanks, pt was able to complete. Completed an  attention exercise to facilitate increased auditory comprehension this session. Pt exhibits perseveration on motor tasks. Pt required minA verbal cues (and sometimes hand over hand) to stop perseveration and focus on next verbally provided direction. Will continue to address to increase safety. Pt w/ complaint of sore tooth. SLP looked in mouth, identified sore tooth, and found suspected thrush. Pt wife reported he had been saying water tasted badly and only has been eating sweet things. She also reports that pt has been pocketing and then eventually spitting out more mild tasting foods. SLP asked pt to bring in food for SLP to observe.  ? ? ?PATIENT EDUCATION: ?Education details: Cognitive-communication impairment ?Person educated: Patient and Spouse ?Education method: Explanation and Demonstration ?Education comprehension: verbalized understanding, verbal cues required, and needs further education ?  ?  ?  ?GOALS: ?Goals reviewed with patient? Yes ?  ?SHORT TERM GOALS: Target date:  ?  ?Patient will demonstrate orientation x4 with modA verbal cues  to refer to compensatory aids. ?Baseline: ?Goal status: INITIAL ?  ?2.  Patient/wife will recall safe swallow strategies to use at home to increase safety with consuming food/drink PO with minA.  ?Baseline:  ?Goal status: INITIAL ?  ?3.  Pt will comprehend 1-step directions with 80% accuracy given less than 2 verbal cues. ?Baseline:  ?Goal status: INITIAL ?  ?4.   Pt will attend to cognitive-communication task for 5 minutes given less than 2 verbal cues to increase safety. ?Baseline:  ?Goal status: INITIAL ?  ?  ?  ?LONG TERM GOALS: Target date: 12/03/2021 ?  ?Wife will report successful use of communication strategies at home to decrease communication breakdowns independently. ?Baseline:  ?Goal status: INITIAL ?  ?2.  Pt will demonstrate orientation x4 with supA verbal cues.  ?Baseline:  ?Goal status: INITIAL ?  ?3.  Pt will follow simple, 2-step directions with  80% accuracy given less than 2 verbal cues. ?Baseline:  ?Goal status: INITIAL ?  ?4.  Pt will attend to cognitive-communication task for 10 minutes given less than 2 verbal cues to increase safety. ?Base

## 2021-10-11 ENCOUNTER — Other Ambulatory Visit: Payer: Self-pay | Admitting: Family Medicine

## 2021-10-11 DIAGNOSIS — B370 Candidal stomatitis: Secondary | ICD-10-CM

## 2021-10-12 ENCOUNTER — Other Ambulatory Visit: Payer: Self-pay | Admitting: Family Medicine

## 2021-10-12 ENCOUNTER — Other Ambulatory Visit (HOSPITAL_BASED_OUTPATIENT_CLINIC_OR_DEPARTMENT_OTHER): Payer: Self-pay

## 2021-10-12 MED ORDER — NYSTATIN 100000 UNIT/ML MT SUSP
5.0000 mL | Freq: Four times a day (QID) | OROMUCOSAL | 0 refills | Status: DC
  Filled 2021-10-12: qty 60, 3d supply, fill #0

## 2021-10-13 ENCOUNTER — Telehealth: Payer: Self-pay | Admitting: Family Medicine

## 2021-10-13 ENCOUNTER — Ambulatory Visit: Payer: PPO | Admitting: Speech Pathology

## 2021-10-13 ENCOUNTER — Ambulatory Visit: Payer: PPO | Attending: Family Medicine | Admitting: Physical Therapy

## 2021-10-13 ENCOUNTER — Encounter: Payer: Self-pay | Admitting: Speech Pathology

## 2021-10-13 ENCOUNTER — Encounter: Payer: Self-pay | Admitting: Physical Therapy

## 2021-10-13 ENCOUNTER — Other Ambulatory Visit (HOSPITAL_BASED_OUTPATIENT_CLINIC_OR_DEPARTMENT_OTHER): Payer: Self-pay

## 2021-10-13 DIAGNOSIS — I639 Cerebral infarction, unspecified: Secondary | ICD-10-CM

## 2021-10-13 DIAGNOSIS — R262 Difficulty in walking, not elsewhere classified: Secondary | ICD-10-CM | POA: Insufficient documentation

## 2021-10-13 DIAGNOSIS — R1312 Dysphagia, oropharyngeal phase: Secondary | ICD-10-CM | POA: Insufficient documentation

## 2021-10-13 DIAGNOSIS — R41840 Attention and concentration deficit: Secondary | ICD-10-CM | POA: Insufficient documentation

## 2021-10-13 DIAGNOSIS — I69318 Other symptoms and signs involving cognitive functions following cerebral infarction: Secondary | ICD-10-CM | POA: Insufficient documentation

## 2021-10-13 DIAGNOSIS — I69354 Hemiplegia and hemiparesis following cerebral infarction affecting left non-dominant side: Secondary | ICD-10-CM | POA: Diagnosis not present

## 2021-10-13 DIAGNOSIS — R296 Repeated falls: Secondary | ICD-10-CM | POA: Diagnosis not present

## 2021-10-13 DIAGNOSIS — R278 Other lack of coordination: Secondary | ICD-10-CM | POA: Insufficient documentation

## 2021-10-13 DIAGNOSIS — R2689 Other abnormalities of gait and mobility: Secondary | ICD-10-CM | POA: Diagnosis not present

## 2021-10-13 DIAGNOSIS — M6281 Muscle weakness (generalized): Secondary | ICD-10-CM | POA: Insufficient documentation

## 2021-10-13 DIAGNOSIS — R41841 Cognitive communication deficit: Secondary | ICD-10-CM | POA: Diagnosis not present

## 2021-10-13 DIAGNOSIS — I69359 Hemiplegia and hemiparesis following cerebral infarction affecting unspecified side: Secondary | ICD-10-CM

## 2021-10-13 MED ORDER — CLOPIDOGREL BISULFATE 75 MG PO TABS
75.0000 mg | ORAL_TABLET | Freq: Every day | ORAL | 0 refills | Status: DC
  Filled 2021-10-13: qty 30, 30d supply, fill #0

## 2021-10-13 MED ORDER — HYDRALAZINE HCL 25 MG PO TABS
25.0000 mg | ORAL_TABLET | Freq: Three times a day (TID) | ORAL | 0 refills | Status: DC
  Filled 2021-10-13: qty 90, 30d supply, fill #0

## 2021-10-13 MED ORDER — AMANTADINE HCL 100 MG PO CAPS
100.0000 mg | ORAL_CAPSULE | Freq: Every day | ORAL | 0 refills | Status: DC
Start: 2021-10-13 — End: 2021-11-16
  Filled 2021-10-13: qty 30, 30d supply, fill #0

## 2021-10-13 NOTE — Therapy (Signed)
[facility] ?[place] ?[address] ?[place], [name], [address] ?Phone: [phone]   Fax:  [phone] ? ?Physical Therapy Treatment ? ?Patient Details  ?Name: William [name] ?MRN: [id number] ?Date of Birth: 10-12-1940 ?Referring Provider (PT): [place] ? ? ?Encounter Date: 10/13/2021 ? ? PT End of Session - 10/13/21 1446   ? ? Visit Number 4   ? Date for PT Re-Evaluation 12/31/21   ? PT Start Time 1400   ? PT Stop Time 3716   ? PT Time Calculation (min) 45 min   ? Equipment Utilized During Treatment Gait belt   ? Activity Tolerance Patient tolerated treatment well;No increased pain   ? Behavior During Therapy Flat affect;Impulsive   ? ?  ?  ? ?  ? ? ?Past Medical History:  ?Diagnosis Date  ? Arthritis   ? low back  ? 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 Mohs surgery jan 2016   ? Bladder stone   ? BPH (benign prostatic hyperplasia) 08/06/2007  ? Carotid artery occlusion   ? Chronic kidney disease 2014  ? Stage III  ? Closed fracture of fifth metacarpal bone 05/15/2015  ? Eczema   ? Fasting hyperglycemia 12/21/2006  ? GERD (gastroesophageal reflux disease)   ? History of carotid artery stenosis   ? S/P BILATERAL CEA  ? 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 HTN (hypertension) 07/19/2015  ? Hyperlipidemia   ? Hypertension   ? 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 Mild, related to stroke history  ? Nocturia   ? Renal insufficiency 06/25/2013  ? S/P carotid endarterectomy   ? BILATERAL ICA--  PATENT PER DUPLEX  05-19-2012  ? 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 Right calf  ? Urinary frequency   ? Vitamin D deficiency   ? ? ?Past Surgical History:  ?Procedure Laterality Date  ? APPENDECTOMY  AS CHILD  ? CARDIOVASCULAR STRESS TEST  03-27-2012  DR CRENSHAW  ? LOW RISK LEXISCAN STUDY-- PROBABLE NORMAL PERFUSION AND SOFT TISSUE ATTENUATION/  NO ISCHEMIA/ EF 51%  ? CAROTID ENDARTERECTOMY Bilateral LEFT  11-12-2008  DR GREG HAYES   ? RIGHT ICA  2006  ([facility])  ? CYSTOSCOPY W/ RETROGRADES Bilateral 06/22/2021  ? Procedure: CYSTOSCOPY WITH RETROGRADE PYELOGRAM;  Surgeon: Franchot Gallo, MD;  Location: [facility];  Service: Urology;  Laterality: Bilateral;  ? CYSTOSCOPY WITH LITHOLAPAXY N/A 02/26/2013  ? Procedure: CYSTOSCOPY WITH LITHOLAPAXY;  Surgeon: Franchot Gallo, MD;  Location: [facility];  Service: Urology;  Laterality: N/A;  ? EYE SURGERY  Jan. 2016  ? cataract surgery both eyes  ? INGUINAL HERNIA REPAIR Right 11-08-2006  ? IR KYPHO EA ADDL LEVEL THORACIC OR LUMBAR  02/12/2021  ? IR RADIOLOGIST EVAL & MGMT  02/18/2021  ? MASS EXCISION N/A 03/03/2016  ? Procedure: EXCISION OF BACK  MASS;  Surgeon: Stark Klein, MD;  Location: [facility];  Service: General;  Laterality: N/A;  ? MOHS SURGERY Left 1/ 2016  ? Dr Nevada Crane-- Basal cell  ? PROSTATE SURGERY    ? 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-C N/A 06/22/2021  ? Procedure: TRANSURETHRAL RESECTION OF BLADDER TUMOR;  Surgeon: Franchot Gallo, MD;  Location: [facility];  Service: Urology;  Laterality: N/A;  ? TRANSURETHRAL RESECTION OF PROSTATE N/A 02/26/2013  ? Procedure: TRANSURETHRAL RESECTION OF THE PROSTATE WITH GYRUS INSTRUMENTS;  Surgeon: Franchot Gallo, MD;  Location: [facility];  Service: Urology;  Laterality: N/A;  ? TRANSURETHRAL RESECTION OF PROSTATE N/A 06/22/2021  ? Procedure: TRANSURETHRAL RESECTION OF THE PROSTATE (TURP);  Surgeon: Franchot Gallo, MD;  Location: [facility];  Service: Urology;  Laterality: N/A;  ? ? ?There were no vitals filed for this visit. ? ? Subjective Assessment - 10/13/21 1400   ? ? Subjective Slow moving, still unsafe with the walker and turning   ? Currently in Pain? No/denies   ? ?  ?  ? ?  ? ? ? ? ? ? ? ? ? ? ? ? ? ? ? ? ? ? ? ? [facility] Adult PT Treatment/Exercise - 10/13/21 0001   ? ?  ? Ambulation/Gait  ? Gait Comments gait 120 feet CGA  without device some cues for step length, then worked on stairs 4" and 6" steps step over step, did this a couple of times, then walked out to the care   ?  ? High Level Balance  ? High Level Balance Comments beach ball hits back and forth working on balance, ball kicks, 6" toe touches, does not like being on the left leg, right leg on step ball bats to get him to bear weight on the left   ?  ? Lumbar Exercises: Aerobic  ? Nustep lvl 4 x6 mins   ?  ? Lumbar Exercises: Machines for Strengthening  ? Cybex Knee Extension no weight 2x10 left only   ? Cybex Knee Flexion 2x10 left only 15#   ?  ? Lumbar Exercises: Seated  ? Other Seated Lumbar Exercises red tband left arm only row and extension   ? ?  ?  ? ?  ? ? ? ? ? ? ? ? ? ? ? ? PT Short Term Goals - 10/01/21 1322   ? ?  ? PT SHORT TERM GOAL #1  ? Title independent with initial HEP   ? Time 2   ? Period Weeks   ? Status New   ? ?  ?  ? ?  ? ? ? ? PT Long Term Goals - 10/13/21 1449   ? ?  ? PT LONG TERM GOAL #1  ? Title Understand fall risks, the importance of exercise and flexibility to health and function   ? Status On-going   ?  ? PT LONG TERM GOAL #2  ? Title decrease TUG time to 13 seconds   ? Status On-going   ? ?  ?  ? ?  ? ? ? ? ? ? ? ? Plan - 10/13/21 1446   ? ? Clinical Impression Statement Patient still impulsive, some difficulty following directions, tends to not swing the left arm with walking, when tired he has bent knees and shuffles more.  Does not like putting weight on the left leg when trying to do the toe touches.   ? PT Next Visit Plan continue to work on safety/pt is impulsive, work on function and balance   ? Consulted and Agree with Plan of Care Patient;Family member/caregiver   ? ?  ?  ? ?  ? ? ?Patient will benefit from skilled therapeutic intervention in order to improve the following deficits and impairments:  Abnormal gait, Decreased coordination, Decreased range of motion, Difficulty walking, Increased muscle spasms, Cardiopulmonary status  limiting activity, Decreased endurance, Decreased activity tolerance, Pain, Impaired flexibility, Decreased balance, Decreased mobility, Decreased strength, Postural dysfunction ? ?Visit Diagnosis: ?Hemiplegia and hemiparesis following cerebral infarction affecting left non-dominant side (  [place]) ? ?Other lack of coordination ? ?Muscle weakness (generalized) ? ?Difficulty in walking, not elsewhere classified ? ?Repeated falls ? ? ? ? ?Problem List ?Patient Active Problem List  ? Diagnosis Date Noted  ? Thrush 10/08/2021  ? Hemiplegia, dominant side S/P CVA (cerebrovascular accident) ([place]) 09/11/2021  ? Insomnia   ? Prediabetes   ? Chronic kidney disease (CKD), stage IV (severe) (HCC)   ? Basal ganglia infarction ([place]) 07/29/2021  ? Transaminitis 07/27/2021  ? UTI (urinary tract infection) 07/27/2021  ? CVA (cerebral vascular accident) ([place]) 07/27/2021  ? Fall at home, initial encounter 07/27/2021  ? Hyperglycemia 07/27/2021  ? CKD (chronic kidney disease), stage IV ([place]) 07/27/2021  ? Cholelithiasis 07/27/2021  ? Hypoxia 07/27/2021  ? Nausea and vomiting 07/27/2021  ? Acute metabolic encephalopathy 49/67/5916  ? Normocytic anemia 07/27/2021  ? Chronic back pain 07/27/2021  ? 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[place]) 06/22/2021  ? Closed fracture of first lumbar vertebra with routine healing 02/03/2021  ? Closed fracture of multiple ribs 11/18/2020  ? Anxiety 01/29/2020  ? Leg pain, bilateral 01/29/2020  ? Ingrown toenail 07/13/2019  ? Lumbar spondylosis 05/02/2018  ? Pain in left knee 03/09/2018  ? 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 Trochanteric bursitis of left hip 01/16/2018  ? Preventative health care 09/26/2017  ? HTN (hypertension) 07/19/2015  ? Hyperlipidemia 07/19/2015  ? Great toe pain 02/11/2014  ? 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 Obesity (BMI 30-39.9) 06/25/2013  ? Renal insufficiency 06/25/2013  ? Weakness of left arm 06/25/2013  ? Sebaceous cyst 03/03/2011  ? Sprain  of lumbar region 07/31/2010  ? Rib pain, left 08/29/2009  ? Carotid artery stenosis, asymptomatic, bilateral 05/02/2009  ? Eczema, atopic 05/31/2008  ? Vitamin D deficiency 03/01/2008  ? BPH (benign pr

## 2021-10-13 NOTE — Telephone Encounter (Signed)
Wil called from [facility] regarding patient.  ? ?Patient gel mattress is starting to cave in  ? ?Patient wants to know if a new rx can be written for a 5 zone pressure reducing mattress  ? ?[phone] please advise  ?

## 2021-10-14 NOTE — Telephone Encounter (Signed)
Order placed. Message sent to [facility] ?

## 2021-10-14 NOTE — Therapy (Signed)
?OUTPATIENT SPEECH LANGUAGE PATHOLOGY TREATMENT NOTE ? ? ?Patient Name: William [name] ?MRN: [id number] ?DOB:1940/11/02, 81 y.o., male ?Today's Date: 10/14/2021 ? ?PCP: Ann Held, DO  ?REFERRING PROVIDER: Ann Held, DO  ? ?END OF SESSION:  ? End of Session - 10/13/21 1320   ? ? Visit Number 3   ? Number of Visits 17   ? Date for SLP Re-Evaluation 12/08/21   ? SLP Start Time 1315   ? SLP Stop Time  1355   ? SLP Time Calculation (min) 40 min   ? Activity Tolerance Patient tolerated treatment well;No increased pain   ? ?  ?  ? ?  ? ? ?Past Medical History:  ?Diagnosis Date  ? Arthritis   ? low back  ? 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 Mohs surgery jan 2016   ? Bladder stone   ? BPH (benign prostatic hyperplasia) 08/06/2007  ? Carotid artery occlusion   ? Chronic kidney disease 2014  ? Stage III  ? Closed fracture of fifth metacarpal bone 05/15/2015  ? Eczema   ? Fasting hyperglycemia 12/21/2006  ? GERD (gastroesophageal reflux disease)   ? History of carotid artery stenosis   ? S/P BILATERAL CEA  ? 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 HTN (hypertension) 07/19/2015  ? Hyperlipidemia   ? Hypertension   ? 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 Mild, related to stroke history  ? Nocturia   ? Renal insufficiency 06/25/2013  ? S/P carotid endarterectomy   ? BILATERAL ICA--  PATENT PER DUPLEX  05-19-2012  ? 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 Right calf  ? Urinary frequency   ? Vitamin D deficiency   ? ?Past Surgical History:  ?Procedure Laterality Date  ? APPENDECTOMY  AS CHILD  ? CARDIOVASCULAR STRESS TEST  03-27-2012  DR CRENSHAW  ? LOW RISK LEXISCAN STUDY-- PROBABLE NORMAL PERFUSION AND SOFT TISSUE ATTENUATION/  NO ISCHEMIA/ EF 51%  ? CAROTID ENDARTERECTOMY Bilateral LEFT  11-12-2008  DR GREG HAYES  ? RIGHT ICA  2006  ([facility])  ? CYSTOSCOPY W/ RETROGRADES Bilateral 06/22/2021  ? Procedure: CYSTOSCOPY WITH RETROGRADE PYELOGRAM;  Surgeon:  Franchot Gallo, MD;  Location: [facility];  Service: Urology;  Laterality: Bilateral;  ? CYSTOSCOPY WITH LITHOLAPAXY N/A 02/26/2013  ? Procedure: CYSTOSCOPY WITH LITHOLAPAXY;  Surgeon: Franchot Gallo, MD;  Location[facility];  Service: Urology;  Laterality: N/A;  ? EYE SURGERY  Jan. 2016  ? cataract surgery both eyes  ? INGUINAL HERNIA REPAIR Right 11-08-2006  ? IR KYPHO EA ADDL LEVEL THORACIC OR LUMBAR  02/12/2021  ? IR RADIOLOGIST EVAL & MGMT  02/18/2021  ? MASS EXCISION N/A 03/03/2016  ? Procedure: EXCISION OF BACK  MASS;  Surgeon: Stark Klein, MD;  Location: [place];  Service: General;  Laterality: N/A;  ? MOHS SURGERY Left 1/ 2016  ? Dr Nevada Crane-- Basal cell  ? PROSTATE SURGERY    ? 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-C N/A 06/22/2021  ? Procedure: TRANSURETHRAL RESECTION OF BLADDER TUMOR;  Surgeon: Franchot Gallo, MD;  Location: [facility];  Service: Urology;  Laterality: N/A;  ? TRANSURETHRAL RESECTION OF PROSTATE N/A 02/26/2013  ? Procedure: TRANSURETHRAL RESECTION OF THE PROSTATE WITH GYRUS INSTRUMENTS;  Surgeon: Franchot Gallo, MD;  Location: [facility];  Service: Urology;  Laterality: N/A;  ? TRANSURETHRAL RESECTION OF PROSTATE N/A 06/22/2021  ?  Procedure: TRANSURETHRAL RESECTION OF THE PROSTATE (TURP);  Surgeon: Franchot Gallo, MD;  Location: [facility];  Service: Urology;  Laterality: N/A;  ? ?Patient Active Problem List  ? Diagnosis Date Noted  ? Thrush 10/08/2021  ? Hemiplegia, dominant side S/P CVA (cerebrovascular accident) ([place]) 09/11/2021  ? Insomnia   ? Prediabetes   ? Chronic kidney disease (CKD), stage IV (severe) (HCC)   ? Basal ganglia infarction ([place]) 07/29/2021  ? Transaminitis 07/27/2021  ? UTI (urinary tract infection) 07/27/2021  ? CVA (cerebral vascular accident) ([place]) 07/27/2021  ? Fall at home, initial encounter 07/27/2021  ? Hyperglycemia 07/27/2021  ? CKD  (chronic kidney disease), stage IV ([place]) 07/27/2021  ? Cholelithiasis 07/27/2021  ? Hypoxia 07/27/2021  ? Nausea and vomiting 07/27/2021  ? Acute metabolic encephalopathy 50/56/9794  ? Normocytic anemia 07/27/2021  ? Chronic back pain 07/27/2021  ? 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[place]) 06/22/2021  ? Closed fracture of first lumbar vertebra with routine healing 02/03/2021  ? Closed fracture of multiple ribs 11/18/2020  ? Anxiety 01/29/2020  ? Leg pain, bilateral 01/29/2020  ? Ingrown toenail 07/13/2019  ? Lumbar spondylosis 05/02/2018  ? Pain in left knee 03/09/2018  ? 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 Trochanteric bursitis of left hip 01/16/2018  ? Preventative health care 09/26/2017  ? HTN (hypertension) 07/19/2015  ? Hyperlipidemia 07/19/2015  ? Great toe pain 02/11/2014  ? 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 Obesity (BMI 30-39.9) 06/25/2013  ? Renal insufficiency 06/25/2013  ? Weakness of left arm 06/25/2013  ? Sebaceous cyst 03/03/2011  ? Sprain of lumbar region 07/31/2010  ? Rib pain, left 08/29/2009  ? Carotid artery stenosis, asymptomatic, bilateral 05/02/2009  ? Eczema, atopic 05/31/2008  ? Vitamin D deficiency 03/01/2008  ? BPH (benign prostatic hyperplasia) 08/06/2007  ? Fasting hyperglycemia 12/21/2006  ? History of right MCA infarct 2006  ? ? ?ONSET DATE: 07/27/21 ? ?REFERRING DIAG: I63.9 (ICD-10-CM) - Cerebrovascular accident (CVA), unspecified mechanism ([place])  ? ?THERAPY DIAG:  ?Cognitive communication deficit ? ?SUBJECTIVE: Pt was more alert and less confused today. ? ?PAIN:  ?Are you having pain? No ? ? ? ? ?OBJECTIVE:  ? ?TODAY'S TREATMENT: ?Pt was seen for skilled ST services with focus on cognitive-communication goals. Pt was oriented x4 today. Wife reported that he was positive for thrush, had begun medicine, and has improved tremendously re: eating more foods, less drooling, less pain. SLP did not visualize any tongue growth this session. SLP facilitated  today's session by targeting focus on generative naming task. On average, pt was only able to name ~4 objects within given, concrete categories. Pt benefited from orthographic cueing and did NOT benefit from any phonemic cueing. With max cueing, pt was able to produce 10 words in each given category (fruit, colors, vegetables). He required mod verbal cueing to remain on task during today's session - continues to look to wife for assistance. Will cont with current POC.  ? ?PATIENT EDUCATION: ?Education details: Cognitive-communication impairment ?Person educated: Patient and Spouse ?Education method: Explanation and Demonstration ?Education comprehension: verbalized understanding, verbal cues required, and needs further education ?  ?  ?  ?GOALS: ?Goals reviewed with patient? Yes ?  ?SHORT TERM GOALS: Target date:  ?  ?Patient will demonstrate orientation x4 with modA verbal cues to refer to compensatory aids. ?Baseline: ?Goal status: MET ?  ?2.  Patient/wife will recall safe swallow strategies to use at home to increase safety with consuming food/drink PO with minA.  ?  Baseline:  ?Goal status: INITIAL ?  ?3.  Pt will comprehend 1-step directions with 80% accuracy given less than 2 verbal cues. ?Baseline:  ?Goal status: INITIAL ?  ?4.   Pt will attend to cognitive-communication task for 5 minutes given less than 2 verbal cues to increase safety. ?Baseline:  ?Goal status: INITIAL ?  ?  ?  ?LONG TERM GOALS: Target date: 12/03/2021 ?  ?Wife will report successful use of communication strategies at home to decrease communication breakdowns independently. ?Baseline:  ?Goal status: INITIAL ?  ?2.  Pt will demonstrate orientation x4 with supA verbal cues.  ?Baseline:  ?Goal status: INITIAL ?  ?3.  Pt will follow simple, 2-step directions with 80% accuracy given less than 2 verbal cues. ?Baseline:  ?Goal status: INITIAL ?  ?4.  Pt will attend to cognitive-communication task for 10 minutes given less than 2 verbal cues  to increase safety. ?Baseline:  ?Goal status: INITIAL ?  ?  ?ASSESSMENT: ?  ?CLINICAL IMPRESSION: ?See tx note.  SLP rec skilled ST services to address cog-comm impairment to maximize functional communica

## 2021-10-15 ENCOUNTER — Ambulatory Visit: Payer: PPO | Admitting: Occupational Therapy

## 2021-10-15 ENCOUNTER — Ambulatory Visit: Payer: PPO | Admitting: Speech Pathology

## 2021-10-15 ENCOUNTER — Encounter: Payer: Self-pay | Admitting: Occupational Therapy

## 2021-10-15 ENCOUNTER — Ambulatory Visit: Payer: PPO | Admitting: Physical Therapy

## 2021-10-15 DIAGNOSIS — R41840 Attention and concentration deficit: Secondary | ICD-10-CM

## 2021-10-15 DIAGNOSIS — I69354 Hemiplegia and hemiparesis following cerebral infarction affecting left non-dominant side: Secondary | ICD-10-CM | POA: Diagnosis not present

## 2021-10-15 DIAGNOSIS — R2689 Other abnormalities of gait and mobility: Secondary | ICD-10-CM

## 2021-10-15 DIAGNOSIS — R41841 Cognitive communication deficit: Secondary | ICD-10-CM

## 2021-10-15 DIAGNOSIS — R296 Repeated falls: Secondary | ICD-10-CM

## 2021-10-15 DIAGNOSIS — R278 Other lack of coordination: Secondary | ICD-10-CM

## 2021-10-15 DIAGNOSIS — I69318 Other symptoms and signs involving cognitive functions following cerebral infarction: Secondary | ICD-10-CM

## 2021-10-15 NOTE — Therapy (Signed)
?OUTPATIENT OCCUPATIONAL THERAPY ?NEURO EVALUATION ? ?Patient Name: William [name] ?MRN: [id number] ?DOB:08/28/1940, 81 y.o., male ?Today's Date: 10/15/2021 ? ?PCP: Roma Schanz, DO ?REFERRING PROVIDER: Ann Held, DO  ? ? OT End of Session - 10/15/21 1315   ? ? Visit Number 1   ? Number of Visits 13   ? Date for OT Re-Evaluation 12/14/21   ? Authorization Type [employer]   ? Authorization Time Period VL: [place]   ? Progress Note Due on Visit 10   ? OT Start Time 1312   ? OT Stop Time 3762   ? OT Time Calculation (min) 52 min   ? Activity Tolerance Patient tolerated treatment well   ? Behavior During Therapy [facility] for tasks assessed/performed;Restless;Flat affect   ? ?  ?  ? ?  ? ?Past Medical History:  ?Diagnosis Date  ? Arthritis   ? low back  ? 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 Mohs surgery jan 2016   ? Bladder stone   ? BPH (benign prostatic hyperplasia) 08/06/2007  ? Carotid artery occlusion   ? Chronic kidney disease 2014  ? Stage III  ? Closed fracture of fifth metacarpal bone 05/15/2015  ? Eczema   ? Fasting hyperglycemia 12/21/2006  ? GERD (gastroesophageal reflux disease)   ? History of carotid artery stenosis   ? S/P BILATERAL CEA  ? 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 HTN (hypertension) 07/19/2015  ? Hyperlipidemia   ? Hypertension   ? 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 Mild, related to stroke history  ? Nocturia   ? Renal insufficiency 06/25/2013  ? S/P carotid endarterectomy   ? BILATERAL ICA--  PATENT PER DUPLEX  05-19-2012  ? 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 Right calf  ? Urinary frequency   ? Vitamin D deficiency   ? ?Past Surgical History:  ?Procedure Laterality Date  ? APPENDECTOMY  AS CHILD  ? CARDIOVASCULAR STRESS TEST  03-27-2012  DR CRENSHAW  ? LOW RISK LEXISCAN STUDY-- PROBABLE NORMAL PERFUSION AND SOFT TISSUE ATTENUATION/  NO ISCHEMIA/ EF 51%  ? CAROTID ENDARTERECTOMY Bilateral LEFT  11-12-2008  DR  GREG HAYES  ? RIGHT ICA  2006  ([facility])  ? CYSTOSCOPY W/ RETROGRADES Bilateral 06/22/2021  ? Procedure: CYSTOSCOPY WITH RETROGRADE PYELOGRAM;  Surgeon: Franchot Gallo, MD;  Location: [facility];  Service: Urology;  Laterality: Bilateral;  ? CYSTOSCOPY WITH LITHOLAPAXY N/A 02/26/2013  ? Procedure: CYSTOSCOPY WITH LITHOLAPAXY;  Surgeon: Franchot Gallo, MD;  Location: [facility];  Service: Urology;  Laterality: N/A;  ? EYE SURGERY  Jan. 2016  ? cataract surgery both eyes  ? INGUINAL HERNIA REPAIR Right 11-08-2006  ? IR KYPHO EA ADDL LEVEL THORACIC OR LUMBAR  02/12/2021  ? IR RADIOLOGIST EVAL & MGMT  02/18/2021  ? MASS EXCISION N/A 03/03/2016  ? Procedure: EXCISION OF BACK  MASS;  Surgeon: Stark Klein, MD;  Location: [place];  Service: General;  Laterality: N/A;  ? MOHS SURGERY Left 1/ 2016  ? Dr Nevada Crane-- Basal cell  ? PROSTATE SURGERY    ? 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-C N/A 06/22/2021  ? Procedure: TRANSURETHRAL RESECTION OF BLADDER TUMOR;  Surgeon: Franchot Gallo, MD;  Location: [facility];  Service: Urology;  Laterality: N/A;  ? TRANSURETHRAL RESECTION OF PROSTATE N/A 02/26/2013  ? Procedure: TRANSURETHRAL RESECTION OF THE PROSTATE WITH GYRUS INSTRUMENTS;  Surgeon:  Franchot Gallo, MD;  [facility];  Service: Urology;  Laterality: N/A;  ? TRANSURETHRAL RESECTION OF PROSTATE N/A 06/22/2021  ? Procedure: TRANSURETHRAL RESECTION OF THE PROSTATE (TURP);  Surgeon: Franchot Gallo, MD;  Location: [facility];  Service: Urology;  Laterality: N/A;  ? ?Patient Active Problem List  ? Diagnosis Date Noted  ? Thrush 10/08/2021  ? Hemiplegia, dominant side S/P CVA (cerebrovascular accident) ([place]) 09/11/2021  ? Insomnia   ? Prediabetes   ? Chronic kidney disease (CKD), stage IV (severe) (HCC)   ? Basal ganglia infarction ([place]) 07/29/2021  ? Transaminitis 07/27/2021  ? UTI (urinary tract infection)  07/27/2021  ? CVA (cerebral vascular accident) ([place]) 07/27/2021  ? Fall at home, initial encounter 07/27/2021  ? Hyperglycemia 07/27/2021  ? CKD (chronic kidney disease), stage IV ([place]) 07/27/2021  ? Cholelithiasis 07/27/2021  ? Hypoxia 07/27/2021  ? Nausea and vomiting 07/27/2021  ? Acute metabolic encephalopathy 11/94/1740  ? Normocytic anemia 07/27/2021  ? Chronic back pain 07/27/2021  ? 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[place]) 06/22/2021  ? Closed fracture of first lumbar vertebra with routine healing 02/03/2021  ? Closed fracture of multiple ribs 11/18/2020  ? Anxiety 01/29/2020  ? Leg pain, bilateral 01/29/2020  ? Ingrown toenail 07/13/2019  ? Lumbar spondylosis 05/02/2018  ? Pain in left knee 03/09/2018  ? 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 Trochanteric bursitis of left hip 01/16/2018  ? Preventative health care 09/26/2017  ? HTN (hypertension) 07/19/2015  ? Hyperlipidemia 07/19/2015  ? Great toe pain 02/11/2014  ? 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 Obesity (BMI 30-39.9) 06/25/2013  ? Renal insufficiency 06/25/2013  ? Weakness of left arm 06/25/2013  ? Sebaceous cyst 03/03/2011  ? Sprain of lumbar region 07/31/2010  ? Rib pain, left 08/29/2009  ? Carotid artery stenosis, asymptomatic, bilateral 05/02/2009  ? Eczema, atopic 05/31/2008  ? Vitamin D deficiency 03/01/2008  ? BPH (benign prostatic hyperplasia) 08/06/2007  ? Fasting hyperglycemia 12/21/2006  ? History of right MCA infarct 2006  ? ? ?ONSET DATE: 07/27/21 ? ?REFERRING DIAG: ?R41.4 (ICD-10-CM) - Hemi-inattention ?I63.9 (ICD-10-CM) - Cerebrovascular accident (CVA), unspecified mechanism ([place])  ? ?THERAPY DIAG:  ?Attention and concentration deficit ? ?Other lack of coordination ? ?Repeated falls ? ?Other abnormalities of gait and mobility ? ?Other symptoms and signs involving cognitive functions following cerebral infarction ? ?SUBJECTIVE:  ? ?SUBJECTIVE STATEMENT: ?Pt arrives to OP OT today w/ primary concerns of  overall decreased level of independence, increased risk of falls, and safety concerns due to cognitive deficits s/p L-sided infarction in February. Pt's history and current level of functioning is largely supplemented by pt's wife, who is his primary caregiver. Pt's wife reports awareness, attention, and overall cognition have declined after this most recent stroke (history of R MCA infarct in 2006 w/ L-sided deficits and NCD). Also states that though the L side of the brain was impacted, the L-sided impairments present from his first CVA seem to have become more pronounced. Pt is currently requiring increased assist w/ all ADLs and not participating in IADLs, but does have an aide coming to the home to assist w/ caregiving. ?Pt accompanied by: self and wife [name]) ? ?PERTINENT HISTORY: L basal ganglia and corona radiata infarct 07/27/21 (had documented AMS for about 5 days prior w/ hospital work-up including MRI negative and symptoms attributed to a UTI); L lower homonymous quadrantanopia. Other PMH includes R MCA infarct in 2006 w/ L-sided hemiparesis, NCD, carotid artery disease (  bilateral), HTN, HLD,CKD3, and hx of bladder cancer ? ?PRECAUTIONS: Fall ? ?WEIGHT BEARING RESTRICTIONS No ? ?PAIN:  ?Are you having pain? No ? ?FALLS: Has patient fallen in last 6 months? Yes. Number of falls Multiple; at least 6 ? ?LIVING ENVIRONMENT: ?Lives with: lives with their family ?Lives in: House/apartment ?Stairs: Yes: External: 1 out back steps; no rails ?Has following equipment at home: hospital bed, walking stick, Walker - 2 wheeled, Shower bench, and bed side commode ? ?PLOF: Independent with gait and Needs assistance with ADLs ? ?PATIENT GOALS: improve balance and "get myself snacks;" per caregiver, increased independence w/ dressing tasks and bed mobility ? ?OBJECTIVE:  ? ?HAND DOMINANCE: Right ? ?ADLs: ?Overall ADLs: Requires assist w/ ambulation, transfers and transitions, BADLs; not currently participating in IADLs   ?Transfers/ambulation related to ADLs: uses RW around the house some, but walking stick most of the time and also w/ mobility out of the house  ?Eating:  Needs assist cutting food due to pt not cutting

## 2021-10-16 ENCOUNTER — Encounter: Payer: Self-pay | Admitting: Speech Pathology

## 2021-10-16 NOTE — Therapy (Signed)
?OUTPATIENT SPEECH LANGUAGE PATHOLOGY TREATMENT NOTE ? ? ?Patient Name: William [name] ?MRN: [id number] ?DOB:06-Nov-1940, 81 y.o., male ?Today's Date: 10/16/2021 ? ?PCP: Ann Held, DO  ?REFERRING PROVIDER: Ann Held, DO  ? ?END OF SESSION:  ? End of Session - 10/16/21 7322   ? ? Visit Number 4   ? Number of Visits 17   ? Date for SLP Re-Evaluation 12/08/21   ? SLP Start Time 1400   ? SLP Stop Time  1440   ? SLP Time Calculation (min) 40 min   ? Activity Tolerance Patient tolerated treatment well;No increased pain   ? ?  ?  ? ?  ? ? ?Past Medical History:  ?Diagnosis Date  ? Arthritis   ? low back  ? 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 Mohs surgery jan 2016   ? Bladder stone   ? BPH (benign prostatic hyperplasia) 08/06/2007  ? Carotid artery occlusion   ? Chronic kidney disease 2014  ? Stage III  ? Closed fracture of fifth metacarpal bone 05/15/2015  ? Eczema   ? Fasting hyperglycemia 12/21/2006  ? GERD (gastroesophageal reflux disease)   ? History of carotid artery stenosis   ? S/P BILATERAL CEA  ? 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 HTN (hypertension) 07/19/2015  ? Hyperlipidemia   ? Hypertension   ? 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 Mild, related to stroke history  ? Nocturia   ? Renal insufficiency 06/25/2013  ? S/P carotid endarterectomy   ? BILATERAL ICA--  PATENT PER DUPLEX  05-19-2012  ? 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 Right calf  ? Urinary frequency   ? Vitamin D deficiency   ? ?Past Surgical History:  ?Procedure Laterality Date  ? APPENDECTOMY  AS CHILD  ? CARDIOVASCULAR STRESS TEST  03-27-2012  DR CRENSHAW  ? LOW RISK LEXISCAN STUDY-- PROBABLE NORMAL PERFUSION AND SOFT TISSUE ATTENUATION/  NO ISCHEMIA/ EF 51%  ? CAROTID ENDARTERECTOMY Bilateral LEFT  11-12-2008  DR GREG HAYES  ? RIGHT ICA  2006  ([facility])  ? CYSTOSCOPY W/ RETROGRADES Bilateral 06/22/2021  ? Procedure: CYSTOSCOPY WITH RETROGRADE PYELOGRAM;  Surgeon:  Franchot Gallo, MD;  Location: [facility];  Service: Urology;  Laterality: Bilateral;  ? CYSTOSCOPY WITH LITHOLAPAXY N/A 02/26/2013  ? Procedure: CYSTOSCOPY WITH LITHOLAPAXY;  Surgeon: Franchot Gallo, MD;  Location: [facility];  Service: Urology;  Laterality: N/A;  ? EYE SURGERY  Jan. 2016  ? cataract surgery both eyes  ? INGUINAL HERNIA REPAIR Right 11-08-2006  ? IR KYPHO EA ADDL LEVEL THORACIC OR LUMBAR  02/12/2021  ? IR RADIOLOGIST EVAL & MGMT  02/18/2021  ? MASS EXCISION N/A 03/03/2016  ? Procedure: EXCISION OF BACK  MASS;  Surgeon: Stark Klein, MD;  Location: [facility];  Service: General;  Laterality: N/A;  ? MOHS SURGERY Left 1/ 2016  ? Dr Nevada Crane-- Basal cell  ? PROSTATE SURGERY    ? 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-C N/A 06/22/2021  ? Procedure: TRANSURETHRAL RESECTION OF BLADDER TUMOR;  Surgeon: Franchot Gallo, MD;  Location: [facility];  Service: Urology;  Laterality: N/A;  ? TRANSURETHRAL RESECTION OF PROSTATE N/A 02/26/2013  ? Procedure: TRANSURETHRAL RESECTION OF THE PROSTATE WITH GYRUS INSTRUMENTS;  Surgeon: Franchot Gallo, MD;  Location: [facility];  Service: Urology;  Laterality: N/A;  ? TRANSURETHRAL RESECTION OF PROSTATE N/A 06/22/2021  ?  Procedure: TRANSURETHRAL RESECTION OF THE PROSTATE (TURP);  Surgeon: Franchot Gallo, MD;  Location: [facility];  Service: Urology;  Laterality: N/A;  ? ?Patient Active Problem List  ? Diagnosis Date Noted  ? Thrush 10/08/2021  ? Hemiplegia, dominant side S/P CVA (cerebrovascular accident) ([place]) 09/11/2021  ? Insomnia   ? Prediabetes   ? Chronic kidney disease (CKD), stage IV (severe) (HCC)   ? Basal ganglia infarction ([place]) 07/29/2021  ? Transaminitis 07/27/2021  ? UTI (urinary tract infection) 07/27/2021  ? CVA (cerebral vascular accident) ([place]) 07/27/2021  ? Fall at home, initial encounter 07/27/2021  ? Hyperglycemia 07/27/2021  ? CKD  (chronic kidney disease), stage IV ([place]) 07/27/2021  ? Cholelithiasis 07/27/2021  ? Hypoxia 07/27/2021  ? Nausea and vomiting 07/27/2021  ? Acute metabolic encephalopathy 47/65/4650  ? Normocytic anemia 07/27/2021  ? Chronic back pain 07/27/2021  ? 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[place]) 06/22/2021  ? Closed fracture of first lumbar vertebra with routine healing 02/03/2021  ? Closed fracture of multiple ribs 11/18/2020  ? Anxiety 01/29/2020  ? Leg pain, bilateral 01/29/2020  ? Ingrown toenail 07/13/2019  ? Lumbar spondylosis 05/02/2018  ? Pain in left knee 03/09/2018  ? 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 Trochanteric bursitis of left hip 01/16/2018  ? Preventative health care 09/26/2017  ? HTN (hypertension) 07/19/2015  ? Hyperlipidemia 07/19/2015  ? Great toe pain 02/11/2014  ? 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 Obesity (BMI 30-39.9) 06/25/2013  ? Renal insufficiency 06/25/2013  ? Weakness of left arm 06/25/2013  ? Sebaceous cyst 03/03/2011  ? Sprain of lumbar region 07/31/2010  ? Rib pain, left 08/29/2009  ? Carotid artery stenosis, asymptomatic, bilateral 05/02/2009  ? Eczema, atopic 05/31/2008  ? Vitamin D deficiency 03/01/2008  ? BPH (benign prostatic hyperplasia) 08/06/2007  ? Fasting hyperglycemia 12/21/2006  ? History of right MCA infarct 2006  ? ? ?ONSET DATE: 07/27/21 ? ?REFERRING DIAG: I63.9 (ICD-10-CM) - Cerebrovascular accident (CVA), unspecified mechanism ([place])  ? ?THERAPY DIAG:  ?Cognitive communication deficit ? ?SUBJECTIVE: Pt was more alert and less confused today. ? ?PAIN:  ?Are you having pain? No ? ? ? ? ?OBJECTIVE:  ? ?TODAY'S TREATMENT: ?Pt was seen for skilled ST services with focus on cognitive-communication goals. Began CADL-3 assessment. Pt required mod-maxA to complete functional tasks required (telling time, filling out reception form, using calendar etc). He required mod verbal cueing to remain on task during today's session - continues to look to  wife for assistance. Will cont with current POC.  ? ?PATIENT EDUCATION: ?Education details: Cognitive-communication impairment ?Person educated: Patient and Spouse ?Education method: Explanation and Demonstration ?Education comprehension: verbalized understanding, verbal cues required, and needs further education ?  ?  ?  ?GOALS: ?Goals reviewed with patient? Yes ?  ?SHORT TERM GOALS: Target date:  ?  ?Patient will demonstrate orientation x4 with modA verbal cues to refer to compensatory aids. ?Baseline: ?Goal status: MET ?  ?2.  Patient/wife will recall safe swallow strategies to use at home to increase safety with consuming food/drink PO with minA.  ?Baseline:  ?Goal status: INITIAL ?  ?3.  Pt will comprehend 1-step directions with 80% accuracy given less than 2 verbal cues. ?Baseline:  ?Goal status: INITIAL ?  ?4.   Pt will attend to cognitive-communication task for 5 minutes given less than 2 verbal cues to increase safety. ?Baseline:  ?Goal status: INITIAL ?  ?  ?  ?LONG TERM GOALS: Target date: 12/03/2021 ?  ?Wife  will report successful use of communication strategies at home to decrease communication breakdowns independently. ?Baseline:  ?Goal status: INITIAL ?  ?2.  Pt will demonstrate orientation x4 with supA verbal cues.  ?Baseline:  ?Goal status: INITIAL ?  ?3.  Pt will follow simple, 2-step directions with 80% accuracy given less than 2 verbal cues. ?Baseline:  ?Goal status: INITIAL ?  ?4.  Pt will attend to cognitive-communication task for 10 minutes given less than 2 verbal cues to increase safety. ?Baseline:  ?Goal status: INITIAL ?  ?  ?ASSESSMENT: ?  ?CLINICAL IMPRESSION: ?See tx note.  SLP rec skilled ST services to address cog-comm impairment to maximize functional communication and decrease caregiver burden. Continue with current POC. ?  ?  ?OBJECTIVE IMPAIRMENTS include attention, memory, awareness, executive functioning, dysarthria, and dysphagia. These impairments are limiting patient  from ADLs/IADLs, effectively communicating at home and in community, and safety when swallowing. ?Factors affecting potential to achieve goals and functional outcome are ability to learn/carryover informat

## 2021-10-18 ENCOUNTER — Encounter: Payer: Self-pay | Admitting: Family Medicine

## 2021-10-18 DIAGNOSIS — G9341 Metabolic encephalopathy: Secondary | ICD-10-CM | POA: Diagnosis not present

## 2021-10-18 DIAGNOSIS — S2249XA Multiple fractures of ribs, unspecified side, initial encounter for closed fracture: Secondary | ICD-10-CM | POA: Diagnosis not present

## 2021-10-18 DIAGNOSIS — I6381 Other cerebral infarction due to occlusion or stenosis of small artery: Secondary | ICD-10-CM | POA: Diagnosis not present

## 2021-10-18 DIAGNOSIS — M47816 Spondylosis without myelopathy or radiculopathy, lumbar region: Secondary | ICD-10-CM | POA: Diagnosis not present

## 2021-10-18 DIAGNOSIS — N184 Chronic kidney disease, stage 4 (severe): Secondary | ICD-10-CM | POA: Diagnosis not present

## 2021-10-18 DIAGNOSIS — B370 Candidal stomatitis: Secondary | ICD-10-CM

## 2021-10-18 DIAGNOSIS — G20 Parkinson's disease: Secondary | ICD-10-CM | POA: Diagnosis not present

## 2021-10-19 ENCOUNTER — Encounter: Payer: Self-pay | Admitting: Family Medicine

## 2021-10-19 DIAGNOSIS — G9341 Metabolic encephalopathy: Secondary | ICD-10-CM | POA: Diagnosis not present

## 2021-10-19 DIAGNOSIS — N184 Chronic kidney disease, stage 4 (severe): Secondary | ICD-10-CM | POA: Diagnosis not present

## 2021-10-19 DIAGNOSIS — G20 Parkinson's disease: Secondary | ICD-10-CM | POA: Diagnosis not present

## 2021-10-19 DIAGNOSIS — S2249XA Multiple fractures of ribs, unspecified side, initial encounter for closed fracture: Secondary | ICD-10-CM | POA: Diagnosis not present

## 2021-10-19 DIAGNOSIS — M47816 Spondylosis without myelopathy or radiculopathy, lumbar region: Secondary | ICD-10-CM | POA: Diagnosis not present

## 2021-10-19 DIAGNOSIS — I6381 Other cerebral infarction due to occlusion or stenosis of small artery: Secondary | ICD-10-CM | POA: Diagnosis not present

## 2021-10-19 NOTE — Telephone Encounter (Signed)
Pt seen on 10/08/21 for concern and has completed prescribed medication. Please advise ?

## 2021-10-20 ENCOUNTER — Ambulatory Visit: Payer: PPO | Admitting: Occupational Therapy

## 2021-10-20 ENCOUNTER — Ambulatory Visit: Payer: PPO | Admitting: Speech Pathology

## 2021-10-20 ENCOUNTER — Ambulatory Visit: Payer: PPO | Admitting: Physical Therapy

## 2021-10-20 DIAGNOSIS — R296 Repeated falls: Secondary | ICD-10-CM

## 2021-10-20 DIAGNOSIS — R278 Other lack of coordination: Secondary | ICD-10-CM

## 2021-10-20 DIAGNOSIS — R41841 Cognitive communication deficit: Secondary | ICD-10-CM

## 2021-10-20 DIAGNOSIS — I69318 Other symptoms and signs involving cognitive functions following cerebral infarction: Secondary | ICD-10-CM

## 2021-10-20 DIAGNOSIS — R262 Difficulty in walking, not elsewhere classified: Secondary | ICD-10-CM

## 2021-10-20 DIAGNOSIS — M6281 Muscle weakness (generalized): Secondary | ICD-10-CM

## 2021-10-20 DIAGNOSIS — R2689 Other abnormalities of gait and mobility: Secondary | ICD-10-CM

## 2021-10-20 DIAGNOSIS — I69354 Hemiplegia and hemiparesis following cerebral infarction affecting left non-dominant side: Secondary | ICD-10-CM | POA: Diagnosis not present

## 2021-10-20 DIAGNOSIS — R41840 Attention and concentration deficit: Secondary | ICD-10-CM

## 2021-10-20 NOTE — Therapy (Signed)
[facility] ?[place], [name], [address] ?Phone: [phone]   Fax:  [phone] ? ?Physical Therapy Treatment ? ?Patient Details  ?Name: William [name] ?MRN: [id number] ?Date of Birth: [birth year] ?Referring Provider (PT): [place] ? ? ?Encounter Date: 10/20/2021 ? ? PT End of Session - 10/20/21 1317   ? ? Visit Number 5   ? Date for PT Re-Evaluation 12/31/21   ? PT Start Time 1235   ? PT Stop Time 1315   ? PT Time Calculation (min) 40 min   ? ?  ?  ? ?  ? ? ?Past Medical History:  ?Diagnosis Date  ? Arthritis   ? low back  ? 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 Mohs surgery jan 2016   ? Bladder stone   ? BPH (benign prostatic hyperplasia) 08/06/2007  ? Carotid artery occlusion   ? Chronic kidney disease 2014  ? Stage III  ? Closed fracture of fifth metacarpal bone 05/15/2015  ? Eczema   ? Fasting hyperglycemia 12/21/2006  ? GERD (gastroesophageal reflux disease)   ? History of carotid artery stenosis   ? S/P BILATERAL CEA  ? 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 HTN (hypertension) 07/19/2015  ? Hyperlipidemia   ? Hypertension   ? 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 Mild, related to stroke history  ? Nocturia   ? Renal insufficiency 06/25/2013  ? S/P carotid endarterectomy   ? BILATERAL ICA--  PATENT PER DUPLEX  05-19-2012  ? 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 Right calf  ? Urinary frequency   ? Vitamin D deficiency   ? ? ?Past Surgical History:  ?Procedure Laterality Date  ? APPENDECTOMY  AS CHILD  ? CARDIOVASCULAR STRESS TEST  03-27-2012  DR CRENSHAW  ? LOW RISK LEXISCAN STUDY-- PROBABLE NORMAL PERFUSION AND SOFT TISSUE ATTENUATION/  NO ISCHEMIA/ EF 51%  ? CAROTID ENDARTERECTOMY Bilateral LEFT  11-12-2008  DR GREG HAYES  ? RIGHT ICA  2006  ([facility])  ? CYSTOSCOPY W/ RETROGRADES Bilateral 06/22/2021  ? Procedure: CYSTOSCOPY WITH RETROGRADE PYELOGRAM;  Surgeon: Franchot Gallo, MD;  Location:  [facility];  Service: Urology;  Laterality: Bilateral;  ? CYSTOSCOPY WITH LITHOLAPAXY N/A 02/26/2013  ? Procedure: CYSTOSCOPY WITH LITHOLAPAXY;  Surgeon: Franchot Gallo, MD;  Location: [facility];  Service: Urology;  Laterality: N/A;  ? EYE SURGERY  Jan. 2016  ? cataract surgery both eyes  ? INGUINAL HERNIA REPAIR Right 11-08-2006  ? IR KYPHO EA ADDL LEVEL THORACIC OR LUMBAR  02/12/2021  ? IR RADIOLOGIST EVAL & MGMT  02/18/2021  ? MASS EXCISION N/A 03/03/2016  ? Procedure: EXCISION OF BACK  MASS;  Surgeon: Stark Klein, MD;  Location: [facility];  Service: General;  Laterality: N/A;  ? MOHS SURGERY Left 1/ 2016  ? Dr Nevada Crane-- Basal cell  ? PROSTATE SURGERY    ? 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-C N/A 06/22/2021  ? Procedure: TRANSURETHRAL RESECTION OF BLADDER TUMOR;  Surgeon: Franchot Gallo, MD;  Location: [facility];  Service: Urology;  Laterality: N/A;  ? TRANSURETHRAL RESECTION OF PROSTATE N/A 02/26/2013  ? Procedure: TRANSURETHRAL RESECTION OF THE PROSTATE WITH GYRUS INSTRUMENTS;  Surgeon: Franchot Gallo, MD;  Location: [facility];  Service: Urology;  Laterality: N/A;  ? TRANSURETHRAL RESECTION OF PROSTATE N/A 06/22/2021  ? Procedure: TRANSURETHRAL RESECTION OF THE PROSTATE (TURP);  Surgeon:  Franchot Gallo, MD;  Location: [facility];  Service: Urology;  Laterality: N/A;  ? ? ?There were no vitals filed for this visit. ? ? Subjective Assessment - 10/20/21 1241   ? ? Subjective okay   ? Currently in Pain? No/denies   ? ?  ?  ? ?  ? ? ? ? ? ? ? ? ? ? ? ? ? ? ? ? ? ? ? ? [place] Adult PT Treatment/Exercise - 10/20/21 0001   ? ?  ? Ambulation/Gait  ? Gait Comments amb outside  no AD navigation all surfaces with cuing - get closer to curb before stepping, slow speed to increase control and pick up left foot and increase stride   about 600 feet CGA- min A very fatigued  ?  ? High Level Balance  ? High  Level Balance Comments amb in gym looking for cones placed on left side- overall did well until distracted then missed cones   ?  ? Lumbar Exercises: Aerobic  ? Nustep lvl 4 x6 mins   ?  ? Lumbar Exercises: Standing  ? Other Standing Lumbar Exercises 30# resisited gait 5 x fwd/back and 3 x each side   min A SW very difficult  ? Other Standing Lumbar Exercises 6 inch alt step taps 20 x 2 sets min A with cuing, no UE support   standing on airex with func reaching left UE 5x 2 sets min A with cuing  ?  ? Lumbar Exercises: Seated  ? Other Seated Lumbar Exercises STS on airex 2 sets 5   min A with cuing to stay on task 2 nd set  ? ?  ?  ? ?  ? ? ? ? ? ? ? ? ? ? ? ? PT Short Term Goals - 10/01/21 1322   ? ?  ? PT SHORT TERM GOAL #1  ? Title independent with initial HEP   ? Time 2   ? Period Weeks   ? Status New   ? ?  ?  ? ?  ? ? ? ? PT Long Term Goals - 10/13/21 1449   ? ?  ? PT LONG TERM GOAL #1  ? Title Understand fall risks, the importance of exercise and flexibility to health and function   ? Status On-going   ?  ? PT LONG TERM GOAL #2  ? Title decrease TUG time to 13 seconds   ? Status On-going   ? ?  ?  ? ?  ? ? ? ? ? ? ? ? Plan - 10/20/21 1317   ? ? Clinical Impression Statement pt arrived without c/o issues. started with walk outside working on navigating curns and obstales, cuing to get closer to curb befoer stepping and to slow speed as he gets fast and left foot drags. func strengthening with resisted gat and laterally was very difficult coming back. overall pt very fatigued as end of session. included acvtities to work left side tracking adn left UE use.   ? PT Treatment/Interventions ADLs/Self Care Home Management;Electrical Stimulation;Moist Heat;Gait training;Neuromuscular re-education;Balance training;Therapeutic exercise;Therapeutic activities;Functional mobility training;Stair training;Patient/family education;Manual techniques   ? PT Next Visit Plan continue to work on safety/pt is impulsive, work on  function and balance   ? ?  ?  ? ?  ? ? ?Patient will benefit from skilled therapeutic intervention in order to improve the following deficits and impairments:  Abnormal gait, Decreased coordination, Decreased range of motion, Difficulty walking, Increased muscle spasms,  Cardiopulmonary status limiting activity, Decreased endurance, Decreased activity tolerance, Pain, Impaired flexibility, Decreased balance, Decreased mobility, Decreased strength, Postural dysfunction ? ?Visit Diagnosis: ?Repeated falls ? ?Difficulty in walking, not elsewhere classified ? ?Muscle weakness (generalized) ? ? ? ? ?Problem List ?Patient Active Problem List  ? Diagnosis Date Noted  ? Thrush 10/08/2021  ? Hemiplegia, dominant side S/P CVA (cerebrovascular accident) ([place]) 09/11/2021  ? Insomnia   ? Prediabetes   ? Chronic kidney disease (CKD), stage IV (severe) (HCC)   ? Basal ganglia infarction ([place]) 07/29/2021  ? Transaminitis 07/27/2021  ? UTI (urinary tract infection) 07/27/2021  ? CVA (cerebral vascular accident) ([place]) 07/27/2021  ? Fall at home, initial encounter 07/27/2021  ? Hyperglycemia 07/27/2021  ? CKD (chronic kidney disease), stage IV ([place]) 07/27/2021  ? Cholelithiasis 07/27/2021  ? Hypoxia 07/27/2021  ? Nausea and vomiting 07/27/2021  ? Acute metabolic encephalopathy 54/56/2563  ? Normocytic anemia 07/27/2021  ? Chronic back pain 07/27/2021  ? 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[place]) 06/22/2021  ? Closed fracture of first lumbar vertebra with routine healing 02/03/2021  ? Closed fracture of multiple ribs 11/18/2020  ? Anxiety 01/29/2020  ? Leg pain, bilateral 01/29/2020  ? Ingrown toenail 07/13/2019  ? Lumbar spondylosis 05/02/2018  ? Pain in left knee 03/09/2018  ? 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 Trochanteric bursitis of left hip 01/16/2018  ? Preventative health care 09/26/2017  ? HTN (hypertension) 07/19/2015  ? Hyperlipidemia 07/19/2015  ? Great toe pain 02/11/2014  ? 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  ? Obesity (BMI 30-39.9) 06/25/2013  ? Renal insufficiency 06/25/2013  ? Weakness of left arm 06/25/2013  ? Sebaceous cyst 03/03/2011  ? Sprain of lumbar region 07/31/2010  ? Rib pai

## 2021-10-20 NOTE — Therapy (Signed)
?OUTPATIENT OCCUPATIONAL THERAPY TREATMENT NOTE ? ? ?Patient Name: William [name] ?MRN: [id number] ?DOB:Oct 17, 1940, 81 y.o., male ?Today's Date: 10/20/2021 ? ?PCP: Roma Schanz, DO ?REFERRING PROVIDER: Ann Held, DO ? ?END OF SESSION: ? ? OT End of Session - 10/20/21 1453   ? ? Visit Number 2   ? Number of Visits 13   ? Date for OT Re-Evaluation 12/14/21   ? Authorization Type [employer]   ? Authorization Time Period VL: [place]   ? Progress Note Due on Visit 10   ? OT Start Time 1405   ? OT Stop Time 1448   ? OT Time Calculation (min) 43 min   ? Activity Tolerance Patient tolerated treatment well   ? Behavior During Therapy [facility] for tasks assessed/performed;Restless;Flat affect   ? ?  ?  ? ?  ? ?Past Medical History:  ?Diagnosis Date  ? Arthritis   ? low back  ? 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 Mohs surgery jan 2016   ? Bladder stone   ? BPH (benign prostatic hyperplasia) 08/06/2007  ? Carotid artery occlusion   ? Chronic kidney disease 2014  ? Stage III  ? Closed fracture of fifth metacarpal bone 05/15/2015  ? Eczema   ? Fasting hyperglycemia 12/21/2006  ? GERD (gastroesophageal reflux disease)   ? History of carotid artery stenosis   ? S/P BILATERAL CEA  ? 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 HTN (hypertension) 07/19/2015  ? Hyperlipidemia   ? Hypertension   ? 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 Mild, related to stroke history  ? Nocturia   ? Renal insufficiency 06/25/2013  ? S/P carotid endarterectomy   ? BILATERAL ICA--  PATENT PER DUPLEX  05-19-2012  ? 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 Right calf  ? Urinary frequency   ? Vitamin D deficiency   ? ?Past Surgical History:  ?Procedure Laterality Date  ? APPENDECTOMY  AS CHILD  ? CARDIOVASCULAR STRESS TEST  03-27-2012  DR CRENSHAW  ? LOW RISK LEXISCAN STUDY-- PROBABLE NORMAL PERFUSION AND SOFT TISSUE ATTENUATION/  NO ISCHEMIA/ EF 51%  ? CAROTID ENDARTERECTOMY Bilateral LEFT   11-12-2008  DR GREG HAYES  ? RIGHT ICA  2006  ([facility])  ? CYSTOSCOPY W/ RETROGRADES Bilateral 06/22/2021  ? Procedure: CYSTOSCOPY WITH RETROGRADE PYELOGRAM;  Surgeon: Franchot Gallo, MD;  Location: [facility];  Service: Urology;  Laterality: Bilateral;  ? CYSTOSCOPY WITH LITHOLAPAXY N/A 02/26/2013  ? Procedure: CYSTOSCOPY WITH LITHOLAPAXY;  Surgeon: Franchot Gallo, MD;  Location: [facility];  Service: Urology;  Laterality: N/A;  ? EYE SURGERY  Jan. 2016  ? cataract surgery both eyes  ? INGUINAL HERNIA REPAIR Right 11-08-2006  ? IR KYPHO EA ADDL LEVEL THORACIC OR LUMBAR  02/12/2021  ? IR RADIOLOGIST EVAL & MGMT  02/18/2021  ? MASS EXCISION N/A 03/03/2016  ? Procedure: EXCISION OF BACK  MASS;  Surgeon: Stark Klein, MD;  Location: [place];  Service: General;  Laterality: N/A;  ? MOHS SURGERY Left 1/ 2016  ? Dr Nevada Crane-- Basal cell  ? PROSTATE SURGERY    ? 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-C N/A 06/22/2021  ? Procedure: TRANSURETHRAL RESECTION OF BLADDER TUMOR;  Surgeon: Franchot Gallo, MD;  Location: [facility];  Service: Urology;  Laterality: N/A;  ? TRANSURETHRAL RESECTION OF PROSTATE N/A 02/26/2013  ? Procedure: TRANSURETHRAL RESECTION OF THE PROSTATE WITH  GYRUS INSTRUMENTS;  Surgeon: Franchot Gallo, MD;  Location: [facility];  Service: Urology;  Laterality: N/A;  ? TRANSURETHRAL RESECTION OF PROSTATE N/A 06/22/2021  ? Procedure: TRANSURETHRAL RESECTION OF THE PROSTATE (TURP);  Surgeon: Franchot Gallo, MD;  Location: [facility];  Service: Urology;  Laterality: N/A;  ? ?Patient Active Problem List  ? Diagnosis Date Noted  ? Thrush 10/08/2021  ? Hemiplegia, dominant side S/P CVA (cerebrovascular accident) ([place]) 09/11/2021  ? Insomnia   ? Prediabetes   ? Chronic kidney disease (CKD), stage IV (severe) (HCC)   ? Basal ganglia infarction ([place]) 07/29/2021  ? Transaminitis 07/27/2021  ? UTI (urinary  tract infection) 07/27/2021  ? CVA (cerebral vascular accident) ([place]) 07/27/2021  ? Fall at home, initial encounter 07/27/2021  ? Hyperglycemia 07/27/2021  ? CKD (chronic kidney disease), stage IV ([place]) 07/27/2021  ? Cholelithiasis 07/27/2021  ? Hypoxia 07/27/2021  ? Nausea and vomiting 07/27/2021  ? Acute metabolic encephalopathy 63/06/6008  ? Normocytic anemia 07/27/2021  ? Chronic back pain 07/27/2021  ? 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[place]) 06/22/2021  ? Closed fracture of first lumbar vertebra with routine healing 02/03/2021  ? Closed fracture of multiple ribs 11/18/2020  ? Anxiety 01/29/2020  ? Leg pain, bilateral 01/29/2020  ? Ingrown toenail 07/13/2019  ? Lumbar spondylosis 05/02/2018  ? Pain in left knee 03/09/2018  ? 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 Trochanteric bursitis of left hip 01/16/2018  ? Preventative health care 09/26/2017  ? HTN (hypertension) 07/19/2015  ? Hyperlipidemia 07/19/2015  ? Great toe pain 02/11/2014  ? 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 Obesity (BMI 30-39.9) 06/25/2013  ? Renal insufficiency 06/25/2013  ? Weakness of left arm 06/25/2013  ? Sebaceous cyst 03/03/2011  ? Sprain of lumbar region 07/31/2010  ? Rib pain, left 08/29/2009  ? Carotid artery stenosis, asymptomatic, bilateral 05/02/2009  ? Eczema, atopic 05/31/2008  ? Vitamin D deficiency 03/01/2008  ? BPH (benign prostatic hyperplasia) 08/06/2007  ? Fasting hyperglycemia 12/21/2006  ? History of right MCA infarct 2006  ? ? ?ONSET DATE: 07/27/21 ? ?REFERRING DIAG: ?R41.4 (ICD-10-CM) - Hemi-inattention ?I63.9 (ICD-10-CM) - Cerebrovascular accident (CVA), unspecified mechanism ([place])  ?  ? ?THERAPY DIAG:  ?Attention and concentration deficit ? ?Other symptoms and signs involving cognitive functions following cerebral infarction ? ?Other lack of coordination ? ?Repeated falls ? ?Other abnormalities of gait and mobility ? ? ?PERTINENT HISTORY: L basal ganglia and corona radiata infarct  07/27/21 (had documented AMS for about 5 days prior w/ hospital work-up including MRI negative and symptoms attributed to a UTI); L lower homonymous quadrantanopia. Other PMH includes R MCA infarct in 2006 w/ L-sided hemiparesis, NCD, carotid artery disease (bilateral), HTN, HLD,CKD3, and hx of bladder cancer ? ?PRECAUTIONS: Fall risk ? ?SUBJECTIVE:  ? ?SUBJECTIVE ASSESSMENT: ?Pt and his wife report he has been "seeing" people later in the day and at night; states there is a man who comes in and moves his bed around (as indicated per wife, this is not actually happening; OT strongly encouraged discussing this w/ his MD if it has not already been addressed) ? ?PAIN: ?Are you having pain? No ? ? ?OBJECTIVE:  ? ?TODAY'S TREATMENT: ?Fall prevention - problem-solved w/ pt's wife to address threshold from inside the house to back patio. Pt's wife brought in pictures and discussed relevant concerns. OT recommended trialing high contrast tape to help designate threshold, w/ practice and repetition to facilitate carryover. Will continue to be monitored. ?LB  dressing - threading BLEs into shorts while seated; completed w/ Min A in 4/4 trials. OT provided verbal and tactile cues for sequencing, extended time for processing, and incorporated errorless learning and repetition for success. Increased cueing to attend/include L side. Level of assist required decreased w/ each attempt. OT attempted cross-leg method which was discontinued due to report of discomfort in lower back/R hip. Most success donning RLE then LLE. ?Folding a towel in a 2-step sequence; OT provided breakdown of task, chaining, verbal cues, and initial HOH A for understanding and to incorporate LUE into task. Success improved w/ repetition; mild difficulty demonstrated w/ terminating task. ? ?GOALS: ?Goals reviewed with patient? Yes ?  ?SHORT TERM GOALS: Target date:  ?  ?STG   Status:  ?1 Pt/caregiver will verbalize understanding of visual perceptual  compensatory strategies ?Baseline: Decreased knowledge of visual deficits and strategies On-going  ?2 Pt will complete symbol cancellation task w/ at least 50% accuracy using visual compensatory strategies t

## 2021-10-21 ENCOUNTER — Encounter: Payer: Self-pay | Admitting: Speech Pathology

## 2021-10-21 ENCOUNTER — Other Ambulatory Visit (HOSPITAL_BASED_OUTPATIENT_CLINIC_OR_DEPARTMENT_OTHER): Payer: Self-pay

## 2021-10-21 ENCOUNTER — Other Ambulatory Visit: Payer: Self-pay | Admitting: Family Medicine

## 2021-10-21 DIAGNOSIS — B370 Candidal stomatitis: Secondary | ICD-10-CM

## 2021-10-21 MED ORDER — NYSTATIN 100000 UNIT/ML MT SUSP
5.0000 mL | Freq: Four times a day (QID) | OROMUCOSAL | 0 refills | Status: DC
  Filled 2021-10-21: qty 60, 3d supply, fill #0

## 2021-10-21 NOTE — Therapy (Signed)
?OUTPATIENT SPEECH LANGUAGE PATHOLOGY TREATMENT NOTE ? ? ?Patient Name: William [name] ?MRN: [id number] ?DOB:Oct 11, 1940, 81 y.o., male ?Today's Date: 10/21/2021 ? ?PCP: Ann Held, DO  ?REFERRING PROVIDER: Ann Held, DO  ? ?END OF SESSION:  ? End of Session - 10/21/21 1601   ? ? Visit Number 5   ? Number of Visits 17   ? Date for SLP Re-Evaluation 12/08/21   ? SLP Start Time 1315   ? SLP Stop Time  1358   ? SLP Time Calculation (min) 43 min   ? Activity Tolerance Patient tolerated treatment well;No increased pain   ? ?  ?  ? ?  ? ? ?Past Medical History:  ?Diagnosis Date  ? Arthritis   ? low back  ? 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 Mohs surgery jan 2016   ? Bladder stone   ? BPH (benign prostatic hyperplasia) 08/06/2007  ? Carotid artery occlusion   ? Chronic kidney disease 2014  ? Stage III  ? Closed fracture of fifth metacarpal bone 05/15/2015  ? Eczema   ? Fasting hyperglycemia 12/21/2006  ? GERD (gastroesophageal reflux disease)   ? History of carotid artery stenosis   ? S/P BILATERAL CEA  ? 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 HTN (hypertension) 07/19/2015  ? Hyperlipidemia   ? Hypertension   ? 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 Mild, related to stroke history  ? Nocturia   ? Renal insufficiency 06/25/2013  ? S/P carotid endarterectomy   ? BILATERAL ICA--  PATENT PER DUPLEX  05-19-2012  ? 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 Right calf  ? Urinary frequency   ? Vitamin D deficiency   ? ?Past Surgical History:  ?Procedure Laterality Date  ? APPENDECTOMY  AS CHILD  ? CARDIOVASCULAR STRESS TEST  03-27-2012  DR CRENSHAW  ? LOW RISK LEXISCAN STUDY-- PROBABLE NORMAL PERFUSION AND SOFT TISSUE ATTENUATION/  NO ISCHEMIA/ EF 51%  ? CAROTID ENDARTERECTOMY Bilateral LEFT  11-12-2008  DR GREG HAYES  ? RIGHT ICA  2006  ([facility])  ? CYSTOSCOPY W/ RETROGRADES Bilateral 06/22/2021  ? Procedure: CYSTOSCOPY WITH RETROGRADE PYELOGRAM;  Surgeon:  Franchot Gallo, MD;  Location: [facility];  Service: Urology;  Laterality: Bilateral;  ? CYSTOSCOPY WITH LITHOLAPAXY N/A 02/26/2013  ? Procedure: CYSTOSCOPY WITH LITHOLAPAXY;  Surgeon: Franchot Gallo, MD;  Location[facility];  Service: Urology;  Laterality: N/A;  ? EYE SURGERY  Jan. 2016  ? cataract surgery both eyes  ? INGUINAL HERNIA REPAIR Right 11-08-2006  ? IR KYPHO EA ADDL LEVEL THORACIC OR LUMBAR  02/12/2021  ? IR RADIOLOGIST EVAL & MGMT  02/18/2021  ? MASS EXCISION N/A 03/03/2016  ? Procedure: EXCISION OF BACK  MASS;  Surgeon: Stark Klein, MD;  Location: [place];  Service: General;  Laterality: N/A;  ? MOHS SURGERY Left 1/ 2016  ? Dr Nevada Crane-- Basal cell  ? PROSTATE SURGERY    ? 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-C N/A 06/22/2021  ? Procedure: TRANSURETHRAL RESECTION OF BLADDER TUMOR;  Surgeon: Franchot Gallo, MD;  Location: [facility];  Service: Urology;  Laterality: N/A;  ? TRANSURETHRAL RESECTION OF PROSTATE N/A 02/26/2013  ? Procedure: TRANSURETHRAL RESECTION OF THE PROSTATE WITH GYRUS INSTRUMENTS;  Surgeon: Franchot Gallo, MD;  Location: [facility];  Service: Urology;  Laterality: N/A;  ? TRANSURETHRAL RESECTION OF PROSTATE N/A 06/22/2021  ?  Procedure: TRANSURETHRAL RESECTION OF THE PROSTATE (TURP);  Surgeon: Franchot Gallo, MD;  Location: [facility];  Service: Urology;  Laterality: N/A;  ? ?Patient Active Problem List  ? Diagnosis Date Noted  ? Thrush 10/08/2021  ? Hemiplegia, dominant side S/P CVA (cerebrovascular accident) ([place]) 09/11/2021  ? Insomnia   ? Prediabetes   ? Chronic kidney disease (CKD), stage IV (severe) (HCC)   ? Basal ganglia infarction ([place]) 07/29/2021  ? Transaminitis 07/27/2021  ? UTI (urinary tract infection) 07/27/2021  ? CVA (cerebral vascular accident) ([place]) 07/27/2021  ? Fall at home, initial encounter 07/27/2021  ? Hyperglycemia 07/27/2021  ? CKD  (chronic kidney disease), stage IV ([place]) 07/27/2021  ? Cholelithiasis 07/27/2021  ? Hypoxia 07/27/2021  ? Nausea and vomiting 07/27/2021  ? Acute metabolic encephalopathy 56/38/7564  ? Normocytic anemia 07/27/2021  ? Chronic back pain 07/27/2021  ? 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[place]) 06/22/2021  ? Closed fracture of first lumbar vertebra with routine healing 02/03/2021  ? Closed fracture of multiple ribs 11/18/2020  ? Anxiety 01/29/2020  ? Leg pain, bilateral 01/29/2020  ? Ingrown toenail 07/13/2019  ? Lumbar spondylosis 05/02/2018  ? Pain in left knee 03/09/2018  ? 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 Trochanteric bursitis of left hip 01/16/2018  ? Preventative health care 09/26/2017  ? HTN (hypertension) 07/19/2015  ? Hyperlipidemia 07/19/2015  ? Great toe pain 02/11/2014  ? 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 Obesity (BMI 30-39.9) 06/25/2013  ? Renal insufficiency 06/25/2013  ? Weakness of left arm 06/25/2013  ? Sebaceous cyst 03/03/2011  ? Sprain of lumbar region 07/31/2010  ? Rib pain, left 08/29/2009  ? Carotid artery stenosis, asymptomatic, bilateral 05/02/2009  ? Eczema, atopic 05/31/2008  ? Vitamin D deficiency 03/01/2008  ? BPH (benign prostatic hyperplasia) 08/06/2007  ? Fasting hyperglycemia 12/21/2006  ? History of right MCA infarct 2006  ? ? ?ONSET DATE: 07/27/21 ? ?REFERRING DIAG: I63.9 (ICD-10-CM) - Cerebrovascular accident (CVA), unspecified mechanism ([place])  ? ?THERAPY DIAG:  ?Cognitive communication deficit ? ?SUBJECTIVE: Pt was more alert and less confused today. ? ?PAIN:  ?Are you having pain? No ? ? ? ? ?OBJECTIVE:  ? ?TODAY'S TREATMENT: ?Pt was seen for skilled ST services with focus on cognitive-communication goals. Completed CADL-3 assessment. Pt required min-to-modA to complete (safety, feelings etc). He required mod verbal cueing to remain on task during today's session - continues to look to wife for assistance. Wife reports pt aggression at home.  SLP  began speaking to wife about strategies that can be helpful to de-escalate situations. Pt became emotional. SLP explained to pt that it was NOT his fault and how dementia/stroke can impact our behaviors. Will continue next session. *Wife reports success with colorful placemat.  ? ?PATIENT EDUCATION: ?Education details: Cognitive-communication impairment ?Person educated: Patient and Spouse ?Education method: Explanation and Demonstration ?Education comprehension: verbalized understanding, verbal cues required, and needs further education ?  ?  ?  ?GOALS: ?Goals reviewed with patient? Yes ?  ?SHORT TERM GOALS: Target date:  ?  ?Patient will demonstrate orientation x4 with modA verbal cues to refer to compensatory aids. ?Baseline: ?Goal status: MET ?  ?2.  Patient/wife will recall safe swallow strategies to use at home to increase safety with consuming food/drink PO with minA.  ?Baseline:  ?Goal status: INITIAL ?  ?3.  Pt will comprehend 1-step directions with 80% accuracy given less than 2 verbal cues. ?Baseline:  ?Goal status: INITIAL ?  ?4.   Pt  will attend to cognitive-communication task for 5 minutes given less than 2 verbal cues to increase safety. ?Baseline:  ?Goal status: INITIAL ?  ?  ?  ?LONG TERM GOALS: Target date: 12/03/2021 ?  ?Wife will report successful use of communication strategies at home to decrease communication breakdowns independently. ?Baseline:  ?Goal status: INITIAL ?  ?2.  Pt will demonstrate orientation x4 with supA verbal cues.  ?Baseline:  ?Goal status: INITIAL ?  ?3.  Pt will follow simple, 2-step directions with 80% accuracy given less than 2 verbal cues. ?Baseline:  ?Goal status: INITIAL ?  ?4.  Pt will attend to cognitive-communication task for 10 minutes given less than 2 verbal cues to increase safety. ?Baseline:  ?Goal status: INITIAL ?  ?  ?ASSESSMENT: ?  ?CLINICAL IMPRESSION: ?See tx note.  SLP rec skilled ST services to address cog-comm impairment to maximize  functional communication and decrease caregiver burden. Continue with current POC. ?  ?  ?OBJECTIVE IMPAIRMENTS include attention, memory, awareness, executive functioning, dysarthria, and dysphagia. These impai

## 2021-10-21 NOTE — Addendum Note (Signed)
Addended by: Sanda Linger on: 10/21/2021 09:02 AM ? ? Modules accepted: Orders ? ?

## 2021-10-22 ENCOUNTER — Ambulatory Visit: Payer: PPO | Admitting: Speech Pathology

## 2021-10-22 ENCOUNTER — Encounter: Payer: Self-pay | Admitting: Family Medicine

## 2021-10-22 ENCOUNTER — Ambulatory Visit: Payer: PPO | Admitting: Occupational Therapy

## 2021-10-22 ENCOUNTER — Ambulatory Visit: Payer: PPO | Admitting: Physical Therapy

## 2021-10-22 ENCOUNTER — Encounter: Payer: Self-pay | Admitting: Physical Therapy

## 2021-10-22 DIAGNOSIS — R262 Difficulty in walking, not elsewhere classified: Secondary | ICD-10-CM

## 2021-10-22 DIAGNOSIS — R2689 Other abnormalities of gait and mobility: Secondary | ICD-10-CM

## 2021-10-22 DIAGNOSIS — R296 Repeated falls: Secondary | ICD-10-CM

## 2021-10-22 DIAGNOSIS — M6281 Muscle weakness (generalized): Secondary | ICD-10-CM

## 2021-10-22 DIAGNOSIS — R278 Other lack of coordination: Secondary | ICD-10-CM

## 2021-10-22 DIAGNOSIS — I69354 Hemiplegia and hemiparesis following cerebral infarction affecting left non-dominant side: Secondary | ICD-10-CM

## 2021-10-22 DIAGNOSIS — I69318 Other symptoms and signs involving cognitive functions following cerebral infarction: Secondary | ICD-10-CM

## 2021-10-22 DIAGNOSIS — R41840 Attention and concentration deficit: Secondary | ICD-10-CM

## 2021-10-22 DIAGNOSIS — R1312 Dysphagia, oropharyngeal phase: Secondary | ICD-10-CM

## 2021-10-22 DIAGNOSIS — R41841 Cognitive communication deficit: Secondary | ICD-10-CM

## 2021-10-22 NOTE — Therapy (Signed)
[facility] [address] ?[place], [name], [address] ?Phone: [phone]   Fax:  [phone] ? ?Physical Therapy Treatment ? ?Patient Details  ?Name: William [name] ?MRN: [id number] ?Date of Birth: 01/11/1941 ?Referring Provider (PT): [place] ? ? ?Encounter Date: 10/22/2021 ? ? PT End of Session - 10/22/21 1059   ? ? Visit Number 6   ? Date for PT Re-Evaluation 12/31/21   ? PT Start Time 1116   ? PT Stop Time 1156   ? PT Time Calculation (min) 40 min   ? Equipment Utilized During Treatment Gait belt   ? Activity Tolerance Patient tolerated treatment well;No increased pain   ? Behavior During Therapy [facility] for tasks assessed/performed;Restless;Flat affect   ? ?  ?  ? ?  ? ? ?Past Medical History:  ?Diagnosis Date  ? Arthritis   ? low back  ? 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 Mohs surgery jan 2016   ? Bladder stone   ? BPH (benign prostatic hyperplasia) 08/06/2007  ? Carotid artery occlusion   ? Chronic kidney disease 2014  ? Stage III  ? Closed fracture of fifth metacarpal bone 05/15/2015  ? Eczema   ? Fasting hyperglycemia 12/21/2006  ? GERD (gastroesophageal reflux disease)   ? History of carotid artery stenosis   ? S/P BILATERAL CEA  ? 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 HTN (hypertension) 07/19/2015  ? Hyperlipidemia   ? Hypertension   ? 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 Mild, related to stroke history  ? Nocturia   ? Renal insufficiency 06/25/2013  ? S/P carotid endarterectomy   ? BILATERAL ICA--  PATENT PER DUPLEX  05-19-2012  ? 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 Right calf  ? Urinary frequency   ? Vitamin D deficiency   ? ? ?Past Surgical History:  ?Procedure Laterality Date  ? APPENDECTOMY  AS CHILD  ? CARDIOVASCULAR STRESS TEST  03-27-2012  DR CRENSHAW  ? LOW RISK LEXISCAN STUDY-- PROBABLE NORMAL PERFUSION AND SOFT TISSUE ATTENUATION/  NO ISCHEMIA/ EF 51%  ? CAROTID ENDARTERECTOMY Bilateral  LEFT  11-12-2008  DR GREG HAYES  ? RIGHT ICA  2006  ([facility])  ? CYSTOSCOPY W/ RETROGRADES Bilateral 06/22/2021  ? Procedure: CYSTOSCOPY WITH RETROGRADE PYELOGRAM;  Surgeon: Franchot Gallo, MD;  [facility];  Service: Urology;  Laterality: Bilateral;  ? CYSTOSCOPY WITH LITHOLAPAXY N/A 02/26/2013  ? Procedure: CYSTOSCOPY WITH LITHOLAPAXY;  Surgeon: Franchot Gallo, MD;  Location: [facility];  Service: Urology;  Laterality: N/A;  ? EYE SURGERY  Jan. 2016  ? cataract surgery both eyes  ? INGUINAL HERNIA REPAIR Right 11-08-2006  ? IR KYPHO EA ADDL LEVEL THORACIC OR LUMBAR  02/12/2021  ? IR RADIOLOGIST EVAL & MGMT  02/18/2021  ? MASS EXCISION N/A 03/03/2016  ? Procedure: EXCISION OF BACK  MASS;  Surgeon: Stark Klein, MD;  Location: [place];  Service: General;  Laterality: N/A;  ? MOHS SURGERY Left 1/ 2016  ? Dr Nevada Crane-- Basal cell  ? PROSTATE SURGERY    ? 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-C N/A 06/22/2021  ? Procedure: TRANSURETHRAL RESECTION OF BLADDER TUMOR;  Surgeon: Franchot Gallo, MD;  Location: [facility];  Service: Urology;  Laterality: N/A;  ? TRANSURETHRAL RESECTION OF PROSTATE N/A 02/26/2013  ? Procedure: TRANSURETHRAL RESECTION OF THE PROSTATE WITH GYRUS INSTRUMENTS;  Surgeon: Franchot Gallo, MD;  Location: [place];  Service: Urology;  Laterality: N/A;  ? TRANSURETHRAL RESECTION OF PROSTATE N/A 06/22/2021  ? Procedure: TRANSURETHRAL RESECTION OF THE PROSTATE (TURP);  Surgeon: Franchot Gallo, MD;  Location: [facility];  Service: Urology;  Laterality: N/A;  ? ? ?There were no vitals filed for this visit. ? ? Subjective Assessment - 10/22/21 1016   ? ? Subjective Patient reports no issues.   ? Pertinent History CVA's   ? Limitations Standing;Walking;House hold activities   ? How long can you walk comfortably? 2 minutes   ? Patient Stated Goals walk beter, move better, no falls   ?  Currently in Pain? No/denies   ? Pain Onset More than a month ago   ? ?  ?  ? ?  ? ? ? ? ? ? ? ? ? ? ? ? ? ? ? ? ? ? ? ? [facility] Adult PT Treatment/Exercise - 10/22/21 0001   ? ?  ? Ambulation/Gait  ? Gait Comments Ambulated forward, back, R, then L, x 20' each, emphasizing big steps, tall posture. He then walked while holding 4# ball in BUE x 75', needed reminders to kept L arm engaged in the activity. Walked again, 2 x 150' while holding 2# ball in LUE only, with VC to increase step length, then walked x 100' with 1# weight on LLE to increase L sided awareness.   ?  ? Lumbar Exercises: Standing  ? Other Standing Lumbar Exercises diagonal reaching from L knee then up and to R and also opposite, 10 reps each way, min A for full engagment.   ? Other Standing Lumbar Exercises 4 inch alt step taps 20 x 2 sets min A with cuing, no UE support. He then placed R foot on step and rotated over LLE iwht reaching to each side, F/B step taps with RLE, rotating over L stance limb, HHA.   ? ?  ?  ? ?  ? ? ? ? ? ? ? ? ? ? PT Education - 10/22/21 1059   ? ? Education Details Educated wife to improved posture and gait iwth L sided engagment.   ? ?  ?  ? ?  ? ? ? PT Short Term Goals - 10/01/21 1322   ? ?  ? PT SHORT TERM GOAL #1  ? Title independent with initial HEP   ? Time 2   ? Period Weeks   ? Status New   ? ?  ?  ? ?  ? ? ? ? PT Long Term Goals - 10/22/21 1104   ? ?  ? PT LONG TERM GOAL #1  ? Title Understand fall risks, the importance of exercise and flexibility to health and function   ? Status On-going   ?  ? PT LONG TERM GOAL #2  ? Title decrease TUG time to 13 seconds   ? Status On-going   ?  ? PT LONG TERM GOAL #3  ? Title walk around our building without rest    ? Baseline Fatigued indoors after about 150'   ? Time 10   ? Period Weeks   ? Status On-going   ? ?  ?  ? ?  ? ? ? ? ? ? ? ? Plan - 10/22/21 1100   ? ? Clinical Impression Statement Patient continues to demosntrate decreased attention and awareness of L side. He is  unable to maintain attention to his L, but did respond well when weights were  added to either his L arm to hold or his LLE, along with VC for longer steps. He did not shuffle and was able to maneuver around obstacles and make L turns with out LOB.   ? Stability/Clinical Decision Making Evolving/Moderate complexity   ? Clinical Decision Making Moderate   ? Rehab Potential Good   ? PT Frequency 2x / week   ? PT Duration Other (comment)   10w  ? PT Treatment/Interventions ADLs/Self Care Home Management;Electrical Stimulation;Moist Heat;Gait training;Neuromuscular re-education;Balance training;Therapeutic exercise;Therapeutic activities;Functional mobility training;Stair training;Patient/family education;Manual techniques   ? PT Next Visit Plan Try to engage L side automatically with carrying objects or weight on LLE.   ? Consulted and Agree with Plan of Care Patient;Family member/caregiver   ? Family Member Consulted wife [name]   ? ?  ?  ? ?  ? ? ?Patient will benefit from skilled therapeutic intervention in order to improve the following deficits and impairments:  Abnormal gait, Decreased coordination, Decreased range of motion, Difficulty walking, Increased muscle spasms, Cardiopulmonary status limiting activity, Decreased endurance, Decreased activity tolerance, Pain, Impaired flexibility, Decreased balance, Decreased mobility, Decreased strength, Postural dysfunction ? ?Visit Diagnosis: ?Other lack of coordination ? ?Repeated falls ? ?Other abnormalities of gait and mobility ? ?Difficulty in walking, not elsewhere classified ? ?Hemiplegia and hemiparesis following cerebral infarction affecting left non-dominant side ([place]) ? ?Muscle weakness (generalized) ? ?Attention and concentration deficit ? ? ? ? ?Problem List ?Patient Active Problem List  ? Diagnosis Date Noted  ? Thrush 10/08/2021  ? Hemiplegia, dominant side S/P CVA (cerebrovascular accident) ([place]) 09/11/2021  ? Insomnia   ? Prediabetes   ? Chronic kidney  disease (CKD), stage IV (severe) (HCC)   ? Basal ganglia infarction ([place]) 07/29/2021  ? Transaminitis 07/27/2021  ? UTI (urinary tract infection) 07/27/2021  ? CVA (cerebral vascular accident) ([place]) 02

## 2021-10-23 NOTE — Therapy (Signed)
?OUTPATIENT OCCUPATIONAL THERAPY TREATMENT NOTE ? ? ?Patient Name: William [name] ?MRN: [id number] ?DOB:07-Sep-1940, 81 y.o., male ?Today's Date: 10/23/2021 ? ?PCP: Roma Schanz, DO ?REFERRING PROVIDER: Ann Held, DO ? ?END OF SESSION: ? ? OT End of Session - 10/22/21 1453   ? ? Visit Number 3   ? Number of Visits 13   ? Date for OT Re-Evaluation 12/14/21   ? Authorization Type [employer]   ? Authorization Time Period VL: [place]   ? Progress Note Due on Visit 10   ? OT Start Time 1152   ? OT Stop Time 1230   ? OT Time Calculation (min) 38 min   ? Activity Tolerance Patient tolerated treatment well   somewhat limited by activity tolerance  ? Behavior During Therapy [facility] for tasks assessed/performed;Restless;Flat affect   ? ?  ?  ? ?  ? ?Past Medical History:  ?Diagnosis Date  ? Arthritis   ? low back  ? 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 Mohs surgery jan 2016   ? Bladder stone   ? BPH (benign prostatic hyperplasia) 08/06/2007  ? Carotid artery occlusion   ? Chronic kidney disease 2014  ? Stage III  ? Closed fracture of fifth metacarpal bone 05/15/2015  ? Eczema   ? Fasting hyperglycemia 12/21/2006  ? GERD (gastroesophageal reflux disease)   ? History of carotid artery stenosis   ? S/P BILATERAL CEA  ? 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 HTN (hypertension) 07/19/2015  ? Hyperlipidemia   ? Hypertension   ? 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 Mild, related to stroke history  ? Nocturia   ? Renal insufficiency 06/25/2013  ? S/P carotid endarterectomy   ? BILATERAL ICA--  PATENT PER DUPLEX  05-19-2012  ? 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 Right calf  ? Urinary frequency   ? Vitamin D deficiency   ? ?Past Surgical History:  ?Procedure Laterality Date  ? APPENDECTOMY  AS CHILD  ? CARDIOVASCULAR STRESS TEST  03-27-2012  DR CRENSHAW  ? LOW RISK LEXISCAN STUDY-- PROBABLE NORMAL PERFUSION AND SOFT TISSUE ATTENUATION/  NO ISCHEMIA/ EF 51%   ? CAROTID ENDARTERECTOMY Bilateral LEFT  11-12-2008  DR GREG HAYES  ? RIGHT ICA  2006  ([facility])  ? CYSTOSCOPY W/ RETROGRADES Bilateral 06/22/2021  ? Procedure: CYSTOSCOPY WITH RETROGRADE PYELOGRAM;  Surgeon: Franchot Gallo, MD;  Location: [facility];  Service: Urology;  Laterality: Bilateral;  ? CYSTOSCOPY WITH LITHOLAPAXY N/A 02/26/2013  ? Procedure: CYSTOSCOPY WITH LITHOLAPAXY;  Surgeon: Franchot Gallo, MD;  Location: [facility];  Service: Urology;  Laterality: N/A;  ? EYE SURGERY  Jan. 2016  ? cataract surgery both eyes  ? INGUINAL HERNIA REPAIR Right 11-08-2006  ? IR KYPHO EA ADDL LEVEL THORACIC OR LUMBAR  02/12/2021  ? IR RADIOLOGIST EVAL & MGMT  02/18/2021  ? MASS EXCISION N/A 03/03/2016  ? Procedure: EXCISION OF BACK  MASS;  Surgeon: Stark Klein, MD;  Location: [facility];  Service: General;  Laterality: N/A;  ? MOHS SURGERY Left 1/ 2016  ? Dr Nevada Crane-- Basal cell  ? PROSTATE SURGERY    ? 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-C N/A 06/22/2021  ? Procedure: TRANSURETHRAL RESECTION OF BLADDER TUMOR;  Surgeon: Franchot Gallo, MD;  Location: [facility];  Service: Urology;  Laterality: N/A;  ? TRANSURETHRAL RESECTION OF PROSTATE N/A 02/26/2013  ? Procedure:  TRANSURETHRAL RESECTION OF THE PROSTATE WITH GYRUS INSTRUMENTS;  Surgeon: Franchot Gallo, MD;  Location: [facility];  Service: Urology;  Laterality: N/A;  ? TRANSURETHRAL RESECTION OF PROSTATE N/A 06/22/2021  ? Procedure: TRANSURETHRAL RESECTION OF THE PROSTATE (TURP);  Surgeon: Franchot Gallo, MD;  Location: [facility];  Service: Urology;  Laterality: N/A;  ? ?Patient Active Problem List  ? Diagnosis Date Noted  ? Thrush 10/08/2021  ? Hemiplegia, dominant side S/P CVA (cerebrovascular accident) ([place]) 09/11/2021  ? Insomnia   ? Prediabetes   ? Chronic kidney disease (CKD), stage IV (severe) (HCC)   ? Basal ganglia infarction ([place]) 07/29/2021  ?  Transaminitis 07/27/2021  ? UTI (urinary tract infection) 07/27/2021  ? CVA (cerebral vascular accident) ([place]) 07/27/2021  ? Fall at home, initial encounter 07/27/2021  ? Hyperglycemia 07/27/2021  ? CKD (chronic kidney disease), stage IV ([place]) 07/27/2021  ? Cholelithiasis 07/27/2021  ? Hypoxia 07/27/2021  ? Nausea and vomiting 07/27/2021  ? Acute metabolic encephalopathy 76/54/6503  ? Normocytic anemia 07/27/2021  ? Chronic back pain 07/27/2021  ? 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[place]) 06/22/2021  ? Closed fracture of first lumbar vertebra with routine healing 02/03/2021  ? Closed fracture of multiple ribs 11/18/2020  ? Anxiety 01/29/2020  ? Leg pain, bilateral 01/29/2020  ? Ingrown toenail 07/13/2019  ? Lumbar spondylosis 05/02/2018  ? Pain in left knee 03/09/2018  ? 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 Trochanteric bursitis of left hip 01/16/2018  ? Preventative health care 09/26/2017  ? HTN (hypertension) 07/19/2015  ? Hyperlipidemia 07/19/2015  ? Great toe pain 02/11/2014  ? 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 Obesity (BMI 30-39.9) 06/25/2013  ? Renal insufficiency 06/25/2013  ? Weakness of left arm 06/25/2013  ? Sebaceous cyst 03/03/2011  ? Sprain of lumbar region 07/31/2010  ? Rib pain, left 08/29/2009  ? Carotid artery stenosis, asymptomatic, bilateral 05/02/2009  ? Eczema, atopic 05/31/2008  ? Vitamin D deficiency 03/01/2008  ? BPH (benign prostatic hyperplasia) 08/06/2007  ? Fasting hyperglycemia 12/21/2006  ? History of right MCA infarct 2006  ? ? ?ONSET DATE: 07/27/21 ? ?REFERRING DIAG: ?R41.4 (ICD-10-CM) - Hemi-inattention ?I63.9 (ICD-10-CM) - Cerebrovascular accident (CVA), unspecified mechanism ([place])  ?  ? ?THERAPY DIAG:  ?Attention and concentration deficit ? ?Other symptoms and signs involving cognitive functions following cerebral infarction ? ?Other lack of coordination ? ?Repeated falls ? ?Other abnormalities of gait and mobility ? ? ?PERTINENT HISTORY: L  basal ganglia and corona radiata infarct 07/27/21 (had documented AMS for about 5 days prior w/ hospital work-up including MRI negative and symptoms attributed to a UTI); L lower homonymous quadrantanopia. Other PMH includes R MCA infarct in 2006 w/ L-sided hemiparesis, NCD, carotid artery disease (bilateral), HTN, HLD,CKD3, and hx of bladder cancer ? ?PRECAUTIONS: Fall risk ? ?SUBJECTIVE:  ? ?SUBJECTIVE ASSESSMENT: ?"I'm tired today" ? ?PAIN: ?Are you having pain? No ? ? ?OBJECTIVE:  ? ?TODAY'S TREATMENT: ?Folding a towel in a 3-step sequence; OT provided breakdown of task, forward chaining, verbal cues, and HOH A prn for understanding and to incorporate LUE into task. After initial attempts, OT also incorporated high contrast visual cues for sequencing and additional modeling w/ positive results; success improved w/ repetition. ?Folding shorts in a 2-step sequence after first assessing pt's natural sequencing. Able to complete task w/ SPV 50% of the time, requiring varying levels of assist for success w/ remaining trials; OT provided verbal cues/coaching, chaining, and HOH A prn. Additional cues required  for LUE incorporation each rep. ?Weight shifting while sitting EOM and incorporating lateral or crossbody reaching to retrieve specified clothing options. Completed 1st set w/ lateral reach to L only for increased attention to L side; 2nd set w/ lateral reach while alternating sides; and 3rd set w/ disengaging of contralateral UE for alternating crossbody reaching to place clothing items in a box on pt's R side. OT provided mod verbal cues, occasional tactile cues, and blocking of RUE to facilitate continued incorporation of LUE during sets. Limitations in core strength became more pronounced w/ prolonged sitting without back support. ? ? ?PATIENT EDUCATION: ?Education details: Education provided to caregiver regarding types of cues to incorporate as well as benefit of errorless learning and task-oriented train for  patients w/ cognitive limitations ?Person educated: Patient and Spouse ?Education method: Explanation ?Education comprehension: verbalized understanding  ? ?GOALS: ?Goals reviewed with patient? Yes ?  ?[place]

## 2021-10-23 NOTE — Therapy (Signed)
?OUTPATIENT SPEECH LANGUAGE PATHOLOGY TREATMENT NOTE ? ? ?Patient Name: William [name] ?MRN: [id number] ?DOB:07-Jul-1940, 81 y.o., male ?Today's Date: 10/23/2021 ? ?PCP: Ann Held, DO  ?REFERRING PROVIDER: Ann Held, DO  ? ?END OF SESSION:  ? End of Session - 10/23/21 0850   ? ? Visit Number 6   ? Number of Visits 17   ? Date for SLP Re-Evaluation 12/08/21   ? SLP Start Time 1100   ? SLP Stop Time  1150   ? SLP Time Calculation (min) 50 min   ? Activity Tolerance Patient tolerated treatment well;No increased pain   ? ?  ?  ? ?  ? ? ?Past Medical History:  ?Diagnosis Date  ? Arthritis   ? low back  ? 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 Mohs surgery jan 2016   ? Bladder stone   ? BPH (benign prostatic hyperplasia) 08/06/2007  ? Carotid artery occlusion   ? Chronic kidney disease 2014  ? Stage III  ? Closed fracture of fifth metacarpal bone 05/15/2015  ? Eczema   ? Fasting hyperglycemia 12/21/2006  ? GERD (gastroesophageal reflux disease)   ? History of carotid artery stenosis   ? S/P BILATERAL CEA  ? 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 HTN (hypertension) 07/19/2015  ? Hyperlipidemia   ? Hypertension   ? 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 Mild, related to stroke history  ? Nocturia   ? Renal insufficiency 06/25/2013  ? S/P carotid endarterectomy   ? BILATERAL ICA--  PATENT PER DUPLEX  05-19-2012  ? 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 Right calf  ? Urinary frequency   ? Vitamin D deficiency   ? ?Past Surgical History:  ?Procedure Laterality Date  ? APPENDECTOMY  AS CHILD  ? CARDIOVASCULAR STRESS TEST  03-27-2012  DR CRENSHAW  ? LOW RISK LEXISCAN STUDY-- PROBABLE NORMAL PERFUSION AND SOFT TISSUE ATTENUATION/  NO ISCHEMIA/ EF 51%  ? CAROTID ENDARTERECTOMY Bilateral LEFT  11-12-2008  DR GREG HAYES  ? RIGHT ICA  2006  ([facility])  ? CYSTOSCOPY W/ RETROGRADES Bilateral 06/22/2021  ? Procedure: CYSTOSCOPY WITH RETROGRADE PYELOGRAM;  Surgeon:  Franchot Gallo, MD;  Location: [facility];  Service: Urology;  Laterality: Bilateral;  ? CYSTOSCOPY WITH LITHOLAPAXY N/A 02/26/2013  ? Procedure: CYSTOSCOPY WITH LITHOLAPAXY;  Surgeon: Franchot Gallo, MD;  Location: [facility];  Service: Urology;  Laterality: N/A;  ? EYE SURGERY  Jan. 2016  ? cataract surgery both eyes  ? INGUINAL HERNIA REPAIR Right 11-08-2006  ? IR KYPHO EA ADDL LEVEL THORACIC OR LUMBAR  02/12/2021  ? IR RADIOLOGIST EVAL & MGMT  02/18/2021  ? MASS EXCISION N/A 03/03/2016  ? Procedure: EXCISION OF BACK  MASS;  Surgeon: Stark Klein, MD;  Location: [place];  Service: General;  Laterality: N/A;  ? MOHS SURGERY Left 1/ 2016  ? Dr Nevada Crane-- Basal cell  ? PROSTATE SURGERY    ? 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-C N/A 06/22/2021  ? Procedure: TRANSURETHRAL RESECTION OF BLADDER TUMOR;  Surgeon: Franchot Gallo, MD;  Location[facility];  Service: Urology;  Laterality: N/A;  ? TRANSURETHRAL RESECTION OF PROSTATE N/A 02/26/2013  ? Procedure: TRANSURETHRAL RESECTION OF THE PROSTATE WITH GYRUS INSTRUMENTS;  Surgeon: Franchot Gallo, MD;  Location: [facility];  Service: Urology;  Laterality: N/A;  ? TRANSURETHRAL RESECTION OF PROSTATE N/A 06/22/2021  ?  Procedure: TRANSURETHRAL RESECTION OF THE PROSTATE (TURP);  Surgeon: Franchot Gallo, MD;  Location: [facility];  Service: Urology;  Laterality: N/A;  ? ?Patient Active Problem List  ? Diagnosis Date Noted  ? Thrush 10/08/2021  ? Hemiplegia, dominant side S/P CVA (cerebrovascular accident) ([place]) 09/11/2021  ? Insomnia   ? Prediabetes   ? Chronic kidney disease (CKD), stage IV (severe) (HCC)   ? Basal ganglia infarction ([place]) 07/29/2021  ? Transaminitis 07/27/2021  ? UTI (urinary tract infection) 07/27/2021  ? CVA (cerebral vascular accident) ([place]) 07/27/2021  ? Fall at home, initial encounter 07/27/2021  ? Hyperglycemia 07/27/2021  ? CKD  (chronic kidney disease), stage IV ([place]) 07/27/2021  ? Cholelithiasis 07/27/2021  ? Hypoxia 07/27/2021  ? Nausea and vomiting 07/27/2021  ? Acute metabolic encephalopathy 00/76/2263  ? Normocytic anemia 07/27/2021  ? Chronic back pain 07/27/2021  ? 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[place]) 06/22/2021  ? Closed fracture of first lumbar vertebra with routine healing 02/03/2021  ? Closed fracture of multiple ribs 11/18/2020  ? Anxiety 01/29/2020  ? Leg pain, bilateral 01/29/2020  ? Ingrown toenail 07/13/2019  ? Lumbar spondylosis 05/02/2018  ? Pain in left knee 03/09/2018  ? 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 Trochanteric bursitis of left hip 01/16/2018  ? Preventative health care 09/26/2017  ? HTN (hypertension) 07/19/2015  ? Hyperlipidemia 07/19/2015  ? Great toe pain 02/11/2014  ? 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 Obesity (BMI 30-39.9) 06/25/2013  ? Renal insufficiency 06/25/2013  ? Weakness of left arm 06/25/2013  ? Sebaceous cyst 03/03/2011  ? Sprain of lumbar region 07/31/2010  ? Rib pain, left 08/29/2009  ? Carotid artery stenosis, asymptomatic, bilateral 05/02/2009  ? Eczema, atopic 05/31/2008  ? Vitamin D deficiency 03/01/2008  ? BPH (benign prostatic hyperplasia) 08/06/2007  ? Fasting hyperglycemia 12/21/2006  ? History of right MCA infarct 2006  ? ? ?ONSET DATE: 07/27/21 ? ?REFERRING DIAG: I63.9 (ICD-10-CM) - Cerebrovascular accident (CVA), unspecified mechanism ([place])  ? ?THERAPY DIAG:  ?Cognitive communication deficit ? ?Dysphagia, oropharyngeal phase ? ?SUBJECTIVE: Pt was more alert and less confused today. ? ?PAIN:  ?Are you having pain? No ? ? ? ? ?OBJECTIVE:  ? ?TODAY'S TREATMENT: ?Pt was seen for skilled ST services with focus on cognitive-communication and dysphagia. Pt was observed with regular texture foods and thin liquids. Pt demonstrated occasional rotary chew with munching pattern. SLP also observed pocketing on R side of peppers, pepperoni, and skins from  tomato. SLP edu wife on possibly having to downgrade diet to something easier to chew and swallow due to concerns for nutrition. Will discuss next session. SLP edu wife on communication and behavioral strategies briefly, as well as provided a handout for wife to read at her leisure. Wife reports some overwhelm, so will be sure to repetitively review modifications and instructions in sessions. Cont with current POC.  ? ?PATIENT EDUCATION: ?Education details: Cognitive-communication impairment ?Person educated: Patient and Spouse ?Education method: Explanation and Demonstration ?Education comprehension: verbalized understanding, verbal cues required, and needs further education ?  ?  ?  ?GOALS: ?Goals reviewed with patient? Yes ?  ?SHORT TERM GOALS: Target date: 10/13/2021 ?  ?Patient will demonstrate orientation x4 with modA verbal cues to refer to compensatory aids. ?Baseline: ?Goal status: MET ?  ?2.  Patient/wife will recall safe swallow strategies to use at home to increase safety with consuming food/drink PO with minA.  ?Baseline:  ?Goal status: INITIAL ?  ?3.  Pt will comprehend  1-step directions with 80% accuracy given less than 2 verbal cues. ?Baseline:  ?Goal status: INITIAL ?  ?4.   Pt will attend to cognitive-communication task for 5 minutes given less than 2 verbal cues to increase safety. ?Baseline:  ?Goal status: INITIAL ?  ?  ?  ?LONG TERM GOALS: Target date: 12/03/2021 ?  ?Wife will report successful use of communication strategies at home to decrease communication breakdowns independently. ?Baseline:  ?Goal status: INITIAL ?  ?2.  Pt will demonstrate orientation x4 with supA verbal cues.  ?Baseline:  ?Goal status: INITIAL ?  ?3.  Pt will follow simple, 2-step directions with 80% accuracy given less than 2 verbal cues. ?Baseline:  ?Goal status: INITIAL ?  ?4.  Pt will attend to cognitive-communication task for 10 minutes given less than 2 verbal cues to increase safety. ?Baseline:  ?Goal status:  INITIAL ?  ?  ?ASSESSMENT: ?  ?CLINICAL IMPRESSION: ?See tx note.  SLP rec skilled ST services to address cog-comm impairment to maximize functional communication and decrease caregiver burden. Continue with curr

## 2021-10-26 ENCOUNTER — Encounter: Payer: Self-pay | Admitting: Occupational Therapy

## 2021-10-26 ENCOUNTER — Ambulatory Visit: Payer: PPO | Admitting: Occupational Therapy

## 2021-10-26 DIAGNOSIS — R278 Other lack of coordination: Secondary | ICD-10-CM

## 2021-10-26 DIAGNOSIS — R41840 Attention and concentration deficit: Secondary | ICD-10-CM

## 2021-10-26 DIAGNOSIS — R296 Repeated falls: Secondary | ICD-10-CM

## 2021-10-26 DIAGNOSIS — R2689 Other abnormalities of gait and mobility: Secondary | ICD-10-CM

## 2021-10-26 DIAGNOSIS — I69318 Other symptoms and signs involving cognitive functions following cerebral infarction: Secondary | ICD-10-CM

## 2021-10-26 DIAGNOSIS — I69354 Hemiplegia and hemiparesis following cerebral infarction affecting left non-dominant side: Secondary | ICD-10-CM | POA: Diagnosis not present

## 2021-10-26 NOTE — Therapy (Signed)
OUTPATIENT OCCUPATIONAL THERAPY TREATMENT NOTE   Patient Name: William [name] MRN: [id number] DOB:1941/03/10, 81 y.o., male Today's Date: 10/26/2021  PCP: Roma Schanz, DO REFERRING PROVIDER: Carollee Herter, Alferd Apa, DO  END OF SESSION:   OT End of Session - 10/26/21 1643     Visit Number 4    Number of Visits 13    Date for OT Re-Evaluation 12/14/21    Authorization Type [employer]    Authorization Time Period VL: [place]    Progress Note Due on Visit 10    OT Start Time 1632    OT Stop Time 1714    OT Time Calculation (min) 42 min    Activity Tolerance Patient tolerated treatment well   somewhat limited by activity tolerance   Behavior During Therapy [facility] for tasks assessed/performed;Restless;Flat affect            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   Mild, related to stroke history   Nocturia    Renal insufficiency 06/25/2013   S/P carotid endarterectomy    BILATERAL ICA--  PATENT PER DUPLEX  05-19-2012   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   Right calf   Urinary frequency    Vitamin D deficiency    Past Surgical History:  Procedure Laterality Date   APPENDECTOMY  AS CHILD   CARDIOVASCULAR STRESS TEST  03-27-2012  DR CRENSHAW   LOW RISK LEXISCAN STUDY-- PROBABLE NORMAL PERFUSION AND SOFT TISSUE ATTENUATION/  NO ISCHEMIA/ EF 51%    CAROTID ENDARTERECTOMY Bilateral LEFT  11-12-2008  DR GREG HAYES   RIGHT ICA  2006  ([facility])   CYSTOSCOPY W/ RETROGRADES Bilateral 06/22/2021   Procedure: CYSTOSCOPY WITH RETROGRADE PYELOGRAM;  Surgeon: Franchot Gallo, MD;  Location: [facility];  Service: Urology;  Laterality: Bilateral;   CYSTOSCOPY WITH LITHOLAPAXY N/A 02/26/2013   Procedure: CYSTOSCOPY WITH LITHOLAPAXY;  Surgeon: Franchot Gallo, MD;  Location: [facility];  Service: Urology;  Laterality: N/A;   EYE SURGERY  Jan. 2016   cataract surgery both eyes   INGUINAL HERNIA REPAIR Right 11-08-2006   IR KYPHO EA ADDL LEVEL THORACIC OR LUMBAR  02/12/2021   IR RADIOLOGIST EVAL & MGMT  02/18/2021   MASS EXCISION N/A 03/03/2016   Procedure: EXCISION OF BACK  MASS;  Surgeon: Stark Klein, MD;  Location: [place];  Service: General;  Laterality: N/A;   MOHS SURGERY Left 1/ 2016   Dr Nevada Crane-- Basal cell   PROSTATE SURGERY     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-C N/A 06/22/2021   Procedure: TRANSURETHRAL RESECTION OF BLADDER TUMOR;  Surgeon: Franchot Gallo, MD;  Location: [facility];  Service: Urology;  Laterality: N/A;   TRANSURETHRAL RESECTION OF PROSTATE N/A 02/26/2013   Procedure:  TRANSURETHRAL RESECTION OF THE PROSTATE WITH GYRUS INSTRUMENTS;  Surgeon: Franchot Gallo, MD;  Location: [name];  Service: Urology;  Laterality: N/A;   TRANSURETHRAL RESECTION OF PROSTATE N/A 06/22/2021   Procedure: TRANSURETHRAL RESECTION OF THE PROSTATE (TURP);  Surgeon: Franchot Gallo, MD;  Location: [facility];  Service: Urology;  Laterality: N/A;   Patient Active Problem List   Diagnosis Date Noted   Thrush 10/08/2021   Hemiplegia, dominant side S/P CVA (cerebrovascular accident) ([place]) 09/11/2021   Insomnia    Prediabetes    Chronic kidney disease (CKD), stage IV (severe) (HCC)    Basal ganglia infarction ([place]) 07/29/2021    Transaminitis 07/27/2021   UTI (urinary tract infection) 07/27/2021   CVA (cerebral vascular accident) ([place]) 07/27/2021   Fall at home, initial encounter 07/27/2021   Hyperglycemia 07/27/2021   CKD (chronic kidney disease), stage IV ([place]) 07/27/2021   Cholelithiasis 07/27/2021   Hypoxia 07/27/2021   Nausea and vomiting 44/08/4740   Acute metabolic encephalopathy 59/56/3875   Normocytic anemia 07/27/2021   Chronic back pain 07/27/2021   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[place]) 06/22/2021   Closed fracture of first lumbar vertebra with routine healing 02/03/2021   Closed fracture of multiple ribs 11/18/2020   Anxiety 01/29/2020   Leg pain, bilateral 01/29/2020   Ingrown toenail 07/13/2019   Lumbar spondylosis 05/02/2018   Pain in left knee 03/09/2018   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   Trochanteric bursitis of left hip 01/16/2018   Preventative health care 09/26/2017   HTN (hypertension) 07/19/2015   Hyperlipidemia 07/19/2015   Great toe pain 02/11/2014   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   Obesity (BMI 30-39.9) 06/25/2013   Renal insufficiency 06/25/2013   Weakness of left arm 06/25/2013   Sebaceous cyst 03/03/2011   Sprain of lumbar region 07/31/2010   Rib pain, left 08/29/2009   Carotid artery stenosis, asymptomatic, bilateral 05/02/2009   Eczema, atopic 05/31/2008   Vitamin D deficiency 03/01/2008   BPH (benign prostatic hyperplasia) 08/06/2007   Fasting hyperglycemia 12/21/2006   History of right MCA infarct 2006    ONSET DATE: 07/27/21  REFERRING DIAG: R41.4 (ICD-10-CM) - Hemi-inattention I63.9 (ICD-10-CM) - Cerebrovascular accident (CVA), unspecified mechanism ([place])     THERAPY DIAG:  Attention and concentration deficit  Other lack of coordination  Repeated falls  Other abnormalities of gait and mobility  Other symptoms and signs involving cognitive functions following cerebral infarction   PERTINENT HISTORY: L  basal ganglia and corona radiata infarct 07/27/21 (had documented AMS for about 5 days prior w/ hospital work-up including MRI negative and symptoms attributed to a UTI); L lower homonymous quadrantanopia. 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 Pt also reports behaviors at night have been worse since starting therapy at 3 sessions/day, so frequency of sessions per day have been decreased/more spread out  PAIN: Are you having pain? No   OBJECTIVE:   TODAY'S TREATMENT: Folding a pillowcase in a 3-step sequence; OT provided breakdown of task, forward chaining, verbal cues, and HOH A prn and Min A to incorporate LUE into task. Completed x5; success improved w/ repetition. Simulated making instant coffee using the microwave (filling mug w/  water, scooping coffee, setting microwave). CGA for mobility around kitchen and min verbal cues for sequencing and incorporation of errorless learning. Most difficulty observed w/ use of microwave.   PATIENT EDUCATION: Facilitated discussion related to wandering and potential safety considerations including relevant aids; will continue to monitor and problem-solve w/ pt's spouse Person educated: Patient and Spouse Education method: Explanation Education comprehension: verbalized understanding   GOALS: Goals reviewed with patient? Yes   SHORT TERM GOALS: Target date:    STG   Status:  1 Pt/caregiver will verbalize understanding of visual perceptual compensatory strategies Baseline: Decreased knowledge of visual deficits and strategies On-going  2 Pt will complete symbol cancellation task w/ at least 50% accuracy using visual  compensatory strategies to indicate improved attention Baseline: 40% accuracy w/ multiple errors On-going  3 Pt will participate in bilateral coordination activity w/ no more than 3 cues for incorporation of LUE Baseline: L-sided inattention On-going      LONG TERM GOALS: Target date: 11/26/2021   LTG   Status:  1 Pt will be able to thread LUE into shirt sleeve w/ SPV in at least 1/2 trials Baseline: Min A to don/doff shirt sleeve on L side On-going  2 Pt will be able to thread bottoms on BLEs w/ Mod A Baseline: Max A for LB dressing On-going  3 Pt will demonstrate supine-to-sit transition w/ cues from caregiver and incorporation of AE/DME prn Baseline: Mod-Max A w/ bed mobility On-going  4 Pt will be able to use TV remote (or adaptive remote) to complete 1 task (e.g., turn TV on/of, turn volume up/down) w/ SPV Baseline: Difficulty  On-going  5 Pt will increase Barthel Index score by at least 5 to indicate improved level of participation in BADLs Baseline: 60/100 On-going      ASSESSMENT:   CLINICAL IMPRESSION: OT continued w/ task-oriented training and discussed additional options for activities pt may be able to complete at home to incorporate into session. Pt demonstrated good carryover of sequencing folding clothes completed in prior session, requiring less initial cues and completing 3/5 attempts w/ SPV. Success may also be due to folding pillowcase vs. larger towel. Pt's spouse also expressed concern regarding reported retropulsion when standing, which OT discussed w/ pt and his wife; will further address in upcoming sessions. Pt continued to respond positively to errorless learning and incorporation of knowledge of results during tasks, as well as verbal cues for increased LUE engagement into tasks prn.   PERFORMANCE DEFICITS in functional skills including ADLs, IADLs, coordination, dexterity, [place], G[place], mobility, balance, and vision, cognitive skills including attention, memory,  orientation, problem solving, safety awareness, sequencing, and temperament/personality, and psychosocial skills including environmental adaptation, interpersonal interactions, and routines and behaviors.    IMPAIRMENTS are limiting patient from ADLs, IADLs, rest and sleep, leisure, and social participation.    COMORBIDITIES may have co-morbidities  that affects occupational performance. Patient will benefit from skilled OT to address above impairments and improve overall function.     PLAN: OT FREQUENCY: 2x/week   OT DURATION: 6 weeks   PLANNED INTERVENTIONS: self care/ADL training, therapeutic exercise, therapeutic activity, neuromuscular re-education, balance training, functional mobility training, aquatic therapy, splinting, moist heat, cryotherapy, patient/family education, cognitive remediation/compensation, visual/perceptual remediation/compensation, psychosocial skills training, energy conservation, and DME and/or AE instructions   RECOMMENDED OTHER SERVICES: Currently receiving PT/ST services at this location   CONSULTED AND AGREED WITH PLAN OF CARE: Patient and family member/caregiver   PLAN FOR NEXT SESSION: Continue w/ task oriented training (practicing instant  coffee, supine to sit/sit-to-stand transitions, dressing tasks); balance/standing tolerance; L-sided attention and bilateral tasks   Kathrine Cords, MSOT, OTR/L 10/26/2021, 5:44 PM

## 2021-10-27 ENCOUNTER — Encounter: Payer: Self-pay | Admitting: Physical Therapy

## 2021-10-27 ENCOUNTER — Ambulatory Visit: Payer: PPO | Admitting: Physical Therapy

## 2021-10-27 ENCOUNTER — Ambulatory Visit: Payer: PPO | Admitting: Occupational Therapy

## 2021-10-27 ENCOUNTER — Encounter: Payer: Self-pay | Admitting: Speech Pathology

## 2021-10-27 ENCOUNTER — Other Ambulatory Visit (HOSPITAL_BASED_OUTPATIENT_CLINIC_OR_DEPARTMENT_OTHER): Payer: Self-pay

## 2021-10-27 ENCOUNTER — Ambulatory Visit: Payer: PPO | Admitting: Speech Pathology

## 2021-10-27 DIAGNOSIS — M6281 Muscle weakness (generalized): Secondary | ICD-10-CM

## 2021-10-27 DIAGNOSIS — I69354 Hemiplegia and hemiparesis following cerebral infarction affecting left non-dominant side: Secondary | ICD-10-CM

## 2021-10-27 DIAGNOSIS — R296 Repeated falls: Secondary | ICD-10-CM

## 2021-10-27 DIAGNOSIS — R2689 Other abnormalities of gait and mobility: Secondary | ICD-10-CM

## 2021-10-27 DIAGNOSIS — R41841 Cognitive communication deficit: Secondary | ICD-10-CM

## 2021-10-27 DIAGNOSIS — R262 Difficulty in walking, not elsewhere classified: Secondary | ICD-10-CM

## 2021-10-27 NOTE — Therapy (Signed)
[facility] ?[place] [address] ?[place], [name], [address] ?Phone: [phone]   Fax:  [phone] ? ?Physical Therapy Treatment ? ?Patient Details  ?Name: William [name] ?MRN: [id number] ?Date of Birth: 1940-12-01 ?Referring Provider (PT): [place] ? ? ?Encounter Date: 10/27/2021 ? ? PT End of Session - 10/27/21 1538   ? ? Visit Number 7   ? Date for PT Re-Evaluation 12/31/21   ? PT Start Time 9833   ? PT Stop Time 8250   ? PT Time Calculation (min) 47 min   ? Activity Tolerance Patient tolerated treatment well;No increased pain   ? Behavior During Therapy [facility] for tasks assessed/performed;Restless;Flat affect   ? ?  ?  ? ?  ? ? ?Past Medical History:  ?Diagnosis Date  ? Arthritis   ? low back  ? 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 Mohs surgery jan 2016   ? Bladder stone   ? BPH (benign prostatic hyperplasia) 08/06/2007  ? Carotid artery occlusion   ? Chronic kidney disease 2014  ? Stage III  ? Closed fracture of fifth metacarpal bone 05/15/2015  ? Eczema   ? Fasting hyperglycemia 12/21/2006  ? GERD (gastroesophageal reflux disease)   ? History of carotid artery stenosis   ? S/P BILATERAL CEA  ? 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 HTN (hypertension) 07/19/2015  ? Hyperlipidemia   ? Hypertension   ? 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 Mild, related to stroke history  ? Nocturia   ? Renal insufficiency 06/25/2013  ? S/P carotid endarterectomy   ? BILATERAL ICA--  PATENT PER DUPLEX  05-19-2012  ? 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 Right calf  ? Urinary frequency   ? Vitamin D deficiency   ? ? ?Past Surgical History:  ?Procedure Laterality Date  ? APPENDECTOMY  AS CHILD  ? CARDIOVASCULAR STRESS TEST  03-27-2012  DR CRENSHAW  ? LOW RISK LEXISCAN STUDY-- PROBABLE NORMAL PERFUSION AND SOFT TISSUE ATTENUATION/  NO ISCHEMIA/ EF 51%  ? CAROTID ENDARTERECTOMY Bilateral LEFT  11-12-2008  DR GREG HAYES  ? RIGHT ICA   2006  ([facility])  ? CYSTOSCOPY W/ RETROGRADES Bilateral 06/22/2021  ? Procedure: CYSTOSCOPY WITH RETROGRADE PYELOGRAM;  Surgeon: Franchot Gallo, MD;  Location: [facility];  Service: Urology;  Laterality: Bilateral;  ? CYSTOSCOPY WITH LITHOLAPAXY N/A 02/26/2013  ? Procedure: CYSTOSCOPY WITH LITHOLAPAXY;  Surgeon: Franchot Gallo, MD;  Location[facility];  Service: Urology;  Laterality: N/A;  ? EYE SURGERY  Jan. 2016  ? cataract surgery both eyes  ? INGUINAL HERNIA REPAIR Right 11-08-2006  ? IR KYPHO EA ADDL LEVEL THORACIC OR LUMBAR  02/12/2021  ? IR RADIOLOGIST EVAL & MGMT  02/18/2021  ? MASS EXCISION N/A 03/03/2016  ? Procedure: EXCISION OF BACK  MASS;  Surgeon: Stark Klein, MD;  Location: [facility];  Service: General;  Laterality: N/A;  ? MOHS SURGERY Left 1/ 2016  ? Dr Nevada Crane-- Basal cell  ? PROSTATE SURGERY    ? 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-C N/A 06/22/2021  ? Procedure: TRANSURETHRAL RESECTION OF BLADDER TUMOR;  Surgeon: Franchot Gallo, MD;  Location[facility];  Service: Urology;  Laterality: N/A;  ? TRANSURETHRAL RESECTION OF PROSTATE N/A 02/26/2013  ? Procedure: TRANSURETHRAL RESECTION OF THE PROSTATE WITH GYRUS INSTRUMENTS;  Surgeon: Franchot Gallo, MD;  Location[facility];  Service: Urology;  Laterality: N/A;  ? TRANSURETHRAL RESECTION OF PROSTATE N/A 06/22/2021  ? Procedure: TRANSURETHRAL RESECTION OF THE PROSTATE (TURP);  Surgeon: Franchot Gallo, MD;  Location: [facility];  Service: Urology;  Laterality: N/A;  ? ? ?There were no vitals filed for this visit. ? ? Subjective Assessment - 10/27/21 1453   ? ? Subjective No falls,wife reports that he has lost balance backwards more often recently   ? Currently in Pain? No/denies   ? ?  ?  ? ?  ? ? ? ? ? [facility] PT Assessment - 10/27/21 0001   ? ?  ? Timed Up and Go Test  ? Normal TUG (seconds) 16   ? TUG Comments no device   ? ?  ?  ? ?   ? ? ? ? ? ? ? ? ? ? ? ? ? ? ? ? [facility] PT Treatment/Exercise - 10/27/21 0001   ? ?  ? Ambulation/Gait  ? Gait Comments walking without device cues for arm swing, stairs up and over with one handrailstep over step   ?  ? High Level Balance  ? High Level Balance Activities Side stepping;Backward walking;Direction changes   ? High Level Balance Comments standing beach ball batting back and then kicks, standing on airex cues to bend waist and correct, marching on airex, 4" toe touches wtih stick, side stepping over stick, figure 8's   ?  ? Lumbar Exercises: Aerobic  ? Nustep lvl 5 x6 mins   ? ?  ?  ? ?  ? ? ? ? ? ? ? ? ? ? ? ? PT Short Term Goals - 10/01/21 1322   ? ?  ? PT SHORT TERM GOAL #1  ? Title independent with initial HEP   ? Time 2   ? Period Weeks   ? Status New   ? ?  ?  ? ?  ? ? ? ? PT Long Term Goals - 10/27/21 1542   ? ?  ? PT LONG TERM GOAL #1  ? Title Understand fall risks, the importance of exercise and flexibility to health and function   ? Status On-going   ?  ? PT LONG TERM GOAL #2  ? Title decrease TUG time to 13 seconds   ? Status On-going   ?  ? PT LONG TERM GOAL #3  ? Title walk around our building without rest    ? Status On-going   ? ?  ?  ? ?  ? ? ? ? ? ? ? ? Plan - 10/27/21 1539   ? ? Clinical Impression Statement Patient did well today with cues for slow and big steps and movements, he tends to go fast and when going fast he tends to shuffle and be at risk for LOB, on the airex he tended to lose balance backward, he could not correct with cues except to reach out and grab the Pbars.  With the balance activities needs the cues for slow, he will tend to perseverate and I think big and slow would be words to use with him to help with this   ? PT Next Visit Plan continue to work on the function, gait, balance safety and the left LE   ? Consulted and Agree with Plan of Care Patient;Family member/caregiver   ? ?  ?  ? ?  ? ? ?Patient will benefit from skilled therapeutic intervention in order  to improve the following deficits and impairments:  Abnormal gait, Decreased  coordination, Decreased range of motion, Difficulty walking, Increased muscle spasms, Cardiopulmonary status limiting activity, Decreased endurance, Decreased activity tolerance, Pain, Impaired flexibility, Decreased balance, Decreased mobility, Decreased strength, Postural dysfunction ? ?Visit Diagnosis: ?Repeated falls ? ?Other abnormalities of gait and mobility ? ?Difficulty in walking, not elsewhere classified ? ?Hemiplegia and hemiparesis following cerebral infarction affecting left non-dominant side ([place]) ? ?Muscle weakness (generalized) ? ? ? ? ?Problem List ?Patient Active Problem List  ? Diagnosis Date Noted  ? Thrush 10/08/2021  ? Hemiplegia, dominant side S/P CVA (cerebrovascular accident) ([place]) 09/11/2021  ? Insomnia   ? Prediabetes   ? Chronic kidney disease (CKD), stage IV (severe) (HCC)   ? Basal ganglia infarction ([place]) 07/29/2021  ? Transaminitis 07/27/2021  ? UTI (urinary tract infection) 07/27/2021  ? CVA (cerebral vascular accident) ([place]) 07/27/2021  ? Fall at home, initial encounter 07/27/2021  ? Hyperglycemia 07/27/2021  ? CKD (chronic kidney disease), stage IV ([place]) 07/27/2021  ? Cholelithiasis 07/27/2021  ? Hypoxia 07/27/2021  ? Nausea and vomiting 07/27/2021  ? Acute metabolic encephalopathy 67/05/4579  ? Normocytic anemia 07/27/2021  ? Chronic back pain 07/27/2021  ? 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[place]) 06/22/2021  ? Closed fracture of first lumbar vertebra with routine healing 02/03/2021  ? Closed fracture of multiple ribs 11/18/2020  ? Anxiety 01/29/2020  ? Leg pain, bilateral 01/29/2020  ? Ingrown toenail 07/13/2019  ? Lumbar spondylosis 05/02/2018  ? Pain in left knee 03/09/2018  ? 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 Trochanteric bursitis of left hip 01/16/2018  ? Preventative health care 09/26/2017  ? HTN (hypertension) 07/19/2015  ? Hyperlipidemia 07/19/2015  ? Great toe pain  02/11/2014  ? 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 Obesity (BMI 30-39.9) 06/25/2013  ? Renal insufficiency 06/25/2013  ? Weakness of left arm 06/25/2013  ? Sebaceous cyst 03/03/2011  ? Sprain o

## 2021-10-28 ENCOUNTER — Other Ambulatory Visit (HOSPITAL_BASED_OUTPATIENT_CLINIC_OR_DEPARTMENT_OTHER): Payer: Self-pay

## 2021-10-28 ENCOUNTER — Ambulatory Visit: Payer: PPO | Admitting: Speech Pathology

## 2021-10-28 ENCOUNTER — Encounter: Payer: Self-pay | Admitting: Speech Pathology

## 2021-10-28 DIAGNOSIS — S32019D Unspecified fracture of first lumbar vertebra, subsequent encounter for fracture with routine healing: Secondary | ICD-10-CM | POA: Diagnosis not present

## 2021-10-28 DIAGNOSIS — M5430 Sciatica, unspecified side: Secondary | ICD-10-CM | POA: Diagnosis not present

## 2021-10-28 DIAGNOSIS — R41841 Cognitive communication deficit: Secondary | ICD-10-CM

## 2021-10-28 DIAGNOSIS — G8929 Other chronic pain: Secondary | ICD-10-CM | POA: Diagnosis not present

## 2021-10-28 DIAGNOSIS — I69354 Hemiplegia and hemiparesis following cerebral infarction affecting left non-dominant side: Secondary | ICD-10-CM | POA: Diagnosis not present

## 2021-10-28 DIAGNOSIS — R0902 Hypoxemia: Secondary | ICD-10-CM | POA: Diagnosis not present

## 2021-10-28 DIAGNOSIS — I69359 Hemiplegia and hemiparesis following cerebral infarction affecting unspecified side: Secondary | ICD-10-CM | POA: Diagnosis not present

## 2021-10-28 NOTE — Therapy (Signed)
?OUTPATIENT SPEECH LANGUAGE PATHOLOGY TREATMENT NOTE ? ? ?Patient Name: William [name] ?MRN: [id number] ?DOB:1941/01/12, 81 y.o., male ?Today's Date: 10/28/2021 ? ?PCP: Ann Held, DO  ?REFERRING PROVIDER: Ann Held, DO  ? ?END OF SESSION:  ? End of Session - 10/27/21 1408   ? ? Visit Number 7   ? Number of Visits 17   ? Date for SLP Re-Evaluation 12/08/21   ? SLP Start Time 1405   ? SLP Stop Time  1447   ? SLP Time Calculation (min) 42 min   ? Activity Tolerance Patient tolerated treatment well;No increased pain   ? ?  ?  ? ?  ? ? ?Past Medical History:  ?Diagnosis Date  ? Arthritis   ? low back  ? 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 Mohs surgery jan 2016   ? Bladder stone   ? BPH (benign prostatic hyperplasia) 08/06/2007  ? Carotid artery occlusion   ? Chronic kidney disease 2014  ? Stage III  ? Closed fracture of fifth metacarpal bone 05/15/2015  ? Eczema   ? Fasting hyperglycemia 12/21/2006  ? GERD (gastroesophageal reflux disease)   ? History of carotid artery stenosis   ? S/P BILATERAL CEA  ? 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 HTN (hypertension) 07/19/2015  ? Hyperlipidemia   ? Hypertension   ? 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 Mild, related to stroke history  ? Nocturia   ? Renal insufficiency 06/25/2013  ? S/P carotid endarterectomy   ? BILATERAL ICA--  PATENT PER DUPLEX  05-19-2012  ? 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 Right calf  ? Urinary frequency   ? Vitamin D deficiency   ? ?Past Surgical History:  ?Procedure Laterality Date  ? APPENDECTOMY  AS CHILD  ? CARDIOVASCULAR STRESS TEST  03-27-2012  DR CRENSHAW  ? LOW RISK LEXISCAN STUDY-- PROBABLE NORMAL PERFUSION AND SOFT TISSUE ATTENUATION/  NO ISCHEMIA/ EF 51%  ? CAROTID ENDARTERECTOMY Bilateral LEFT  11-12-2008  DR GREG HAYES  ? RIGHT ICA  2006  ([facility])  ? CYSTOSCOPY W/ RETROGRADES Bilateral 06/22/2021  ? Procedure: CYSTOSCOPY WITH RETROGRADE PYELOGRAM;  Surgeon:  Franchot Gallo, MD;  Location: [facility];  Service: Urology;  Laterality: Bilateral;  ? CYSTOSCOPY WITH LITHOLAPAXY N/A 02/26/2013  ? Procedure: CYSTOSCOPY WITH LITHOLAPAXY;  Surgeon: Franchot Gallo, MD;  Location: [facility];  Service: Urology;  Laterality: N/A;  ? EYE SURGERY  Jan. 2016  ? cataract surgery both eyes  ? INGUINAL HERNIA REPAIR Right 11-08-2006  ? IR KYPHO EA ADDL LEVEL THORACIC OR LUMBAR  02/12/2021  ? IR RADIOLOGIST EVAL & MGMT  02/18/2021  ? MASS EXCISION N/A 03/03/2016  ? Procedure: EXCISION OF BACK  MASS;  Surgeon: Stark Klein, MD;  Location: [facility];  Service: General;  Laterality: N/A;  ? MOHS SURGERY Left 1/ 2016  ? Dr Nevada Crane-- Basal cell  ? PROSTATE SURGERY    ? 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-C N/A 06/22/2021  ? Procedure: TRANSURETHRAL RESECTION OF BLADDER TUMOR;  Surgeon: Franchot Gallo, MD;  Location: [facility];  Service: Urology;  Laterality: N/A;  ? TRANSURETHRAL RESECTION OF PROSTATE N/A 02/26/2013  ? Procedure: TRANSURETHRAL RESECTION OF THE PROSTATE WITH GYRUS INSTRUMENTS;  Surgeon: Franchot Gallo, MD;  Location: [facility];  Service: Urology;  Laterality: N/A;  ? TRANSURETHRAL RESECTION OF PROSTATE N/A 06/22/2021  ?  Procedure: TRANSURETHRAL RESECTION OF THE PROSTATE (TURP);  Surgeon: Franchot Gallo, MD;  Location: [facility];  Service: Urology;  Laterality: N/A;  ? ?Patient Active Problem List  ? Diagnosis Date Noted  ? Thrush 10/08/2021  ? Hemiplegia, dominant side S/P CVA (cerebrovascular accident) ([place]) 09/11/2021  ? Insomnia   ? Prediabetes   ? Chronic kidney disease (CKD), stage IV (severe) (HCC)   ? Basal ganglia infarction ([place]) 07/29/2021  ? Transaminitis 07/27/2021  ? UTI (urinary tract infection) 07/27/2021  ? CVA (cerebral vascular accident) ([place]) 07/27/2021  ? Fall at home, initial encounter 07/27/2021  ? Hyperglycemia 07/27/2021  ? CKD  (chronic kidney disease), stage IV ([place]) 07/27/2021  ? Cholelithiasis 07/27/2021  ? Hypoxia 07/27/2021  ? Nausea and vomiting 07/27/2021  ? Acute metabolic encephalopathy 82/64/1583  ? Normocytic anemia 07/27/2021  ? Chronic back pain 07/27/2021  ? 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[place]) 06/22/2021  ? Closed fracture of first lumbar vertebra with routine healing 02/03/2021  ? Closed fracture of multiple ribs 11/18/2020  ? Anxiety 01/29/2020  ? Leg pain, bilateral 01/29/2020  ? Ingrown toenail 07/13/2019  ? Lumbar spondylosis 05/02/2018  ? Pain in left knee 03/09/2018  ? 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 Trochanteric bursitis of left hip 01/16/2018  ? Preventative health care 09/26/2017  ? HTN (hypertension) 07/19/2015  ? Hyperlipidemia 07/19/2015  ? Great toe pain 02/11/2014  ? 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 Obesity (BMI 30-39.9) 06/25/2013  ? Renal insufficiency 06/25/2013  ? Weakness of left arm 06/25/2013  ? Sebaceous cyst 03/03/2011  ? Sprain of lumbar region 07/31/2010  ? Rib pain, left 08/29/2009  ? Carotid artery stenosis, asymptomatic, bilateral 05/02/2009  ? Eczema, atopic 05/31/2008  ? Vitamin D deficiency 03/01/2008  ? BPH (benign prostatic hyperplasia) 08/06/2007  ? Fasting hyperglycemia 12/21/2006  ? History of right MCA infarct 2006  ? ? ?ONSET DATE: 07/27/21 ? ?REFERRING DIAG: I63.9 (ICD-10-CM) - Cerebrovascular accident (CVA), unspecified mechanism ([place])  ? ?THERAPY DIAG:  ?Cognitive communication deficit ? ?SUBJECTIVE: Pt was more alert and less confused today. ? ?PAIN:  ?Are you having pain? No ? ? ? ? ?OBJECTIVE:  ? ?TODAY'S TREATMENT: ?Pt was seen for skilled ST services with focus on dysphagia. Based off skilled observation with PO last session, SLP rec downgrade to minced and moist diet with occasional soft and bite foods that pt is able to chew and swallow. SLP provided wife with thorough education regarding foods that will be more difficult to  chew. At this time, pt is SAFE to consume soft and bite diet with direct supervision; however, SLP does not suspect this will be the most efficient diet for pt due to munching mastication pattern, pocketing, and spitting out foods that are too hard to swallow. Continuation of soft and bite diet could lead to problems with pt receiving adequate nutrition. SLP and wife discussed food modifications and generated a list of sauces that could be added to foods. Wife demonstrated understanding, but may need further education. Provided with handouts for safe swallow strategies and minced/moist diet. Cont with current POC.  ? ?PATIENT EDUCATION: ?Education details: Cognitive-communication impairment ?Person educated: Patient and Spouse ?Education method: Explanation and Demonstration ?Education comprehension: verbalized understanding, verbal cues required, and needs further education ?  ?  ?  ?GOALS: ?Goals reviewed with patient? Yes ?  ?SHORT TERM GOALS: Target date:  ?  ?Patient will demonstrate orientation x4 with modA verbal cues to refer to compensatory aids. ?  Baseline: ?Goal status: MET ?  ?2.  Patient/wife will recall safe swallow strategies to use at home to increase safety with consuming food/drink PO with minA.  ?Baseline:  ?Goal status: INITIAL ?  ?3.  Pt will comprehend 1-step directions with 80% accuracy given less than 2 verbal cues. ?Baseline:  ?Goal status: INITIAL ?  ?4.   Pt will attend to cognitive-communication task for 5 minutes given less than 2 verbal cues to increase safety. ?Baseline:  ?Goal status: INITIAL ?  ?  ?  ?LONG TERM GOALS: Target date: 12/03/2021 ?  ?Wife will report successful use of communication strategies at home to decrease communication breakdowns independently. ?Baseline:  ?Goal status: INITIAL ?  ?2.  Pt will demonstrate orientation x4 with supA verbal cues.  ?Baseline:  ?Goal status: INITIAL ?  ?3.  Pt will follow simple, 2-step directions with 80% accuracy given less than  2 verbal cues. ?Baseline:  ?Goal status: INITIAL ?  ?4.  Pt will attend to cognitive-communication task for 10 minutes given less than 2 verbal cues to increase safety. ?Baseline:  ?Goal status: INITIAL ?  ?

## 2021-10-29 ENCOUNTER — Ambulatory Visit: Payer: PPO | Admitting: Occupational Therapy

## 2021-10-29 ENCOUNTER — Encounter: Payer: Self-pay | Admitting: Physical Therapy

## 2021-10-29 ENCOUNTER — Ambulatory Visit: Payer: PPO | Admitting: Physical Therapy

## 2021-10-29 ENCOUNTER — Ambulatory Visit: Payer: PPO | Admitting: Speech Pathology

## 2021-10-29 DIAGNOSIS — R2689 Other abnormalities of gait and mobility: Secondary | ICD-10-CM

## 2021-10-29 DIAGNOSIS — I69354 Hemiplegia and hemiparesis following cerebral infarction affecting left non-dominant side: Secondary | ICD-10-CM | POA: Diagnosis not present

## 2021-10-29 DIAGNOSIS — R296 Repeated falls: Secondary | ICD-10-CM

## 2021-10-29 DIAGNOSIS — M6281 Muscle weakness (generalized): Secondary | ICD-10-CM

## 2021-10-29 DIAGNOSIS — R278 Other lack of coordination: Secondary | ICD-10-CM

## 2021-10-29 DIAGNOSIS — R262 Difficulty in walking, not elsewhere classified: Secondary | ICD-10-CM

## 2021-10-29 DIAGNOSIS — I69318 Other symptoms and signs involving cognitive functions following cerebral infarction: Secondary | ICD-10-CM

## 2021-10-29 DIAGNOSIS — R41840 Attention and concentration deficit: Secondary | ICD-10-CM

## 2021-10-29 NOTE — Therapy (Signed)
[place]. [place], [name], [address] Phone: [phone]   Fax:  [phone]  Physical Therapy Treatment  Patient Details  Name: William [name] MRN: [id number] Date of Birth: [birth year] Referring Provider (PT): Etter Sjogren   Encounter Date: 10/29/2021   PT End of Session - 10/29/21 1611     Visit Number 8    Date for PT Re-Evaluation 12/31/21    PT Start Time 1696    PT Stop Time 1700    PT Time Calculation (min) 46 min    Equipment Utilized During Treatment Gait belt    Activity Tolerance Patient tolerated treatment well;No increased pain    Behavior During Therapy [facility] for tasks assessed/performed;Restless;Flat affect             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   Mild, related to stroke history   Nocturia    Renal insufficiency 06/25/2013   S/P carotid endarterectomy    BILATERAL ICA--  PATENT PER DUPLEX  05-19-2012   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   Right calf   Urinary frequency    Vitamin D deficiency     Past Surgical History:  Procedure Laterality Date   APPENDECTOMY  AS CHILD   CARDIOVASCULAR STRESS TEST  03-27-2012  DR CRENSHAW   LOW RISK LEXISCAN STUDY-- PROBABLE NORMAL PERFUSION AND SOFT TISSUE ATTENUATION/  NO ISCHEMIA/ EF 51%   CAROTID ENDARTERECTOMY Bilateral  LEFT  11-12-2008  DR GREG HAYES   RIGHT ICA  2006  ([facility])   CYSTOSCOPY W/ RETROGRADES Bilateral 06/22/2021   Procedure: CYSTOSCOPY WITH RETROGRADE PYELOGRAM;  Surgeon: Franchot Gallo, MD;  Location: [facility];  Service: Urology;  Laterality: Bilateral;   CYSTOSCOPY WITH LITHOLAPAXY N/A 02/26/2013   Procedure: CYSTOSCOPY WITH LITHOLAPAXY;  Surgeon: Franchot Gallo, MD;  Location: [facility];  Service: Urology;  Laterality: N/A;   EYE SURGERY  Jan. 2016   cataract surgery both eyes   INGUINAL HERNIA REPAIR Right 11-08-2006   IR KYPHO EA ADDL LEVEL THORACIC OR LUMBAR  02/12/2021   IR RADIOLOGIST EVAL & MGMT  02/18/2021   MASS EXCISION N/A 03/03/2016   Procedure: EXCISION OF BACK  MASS;  Surgeon: Stark Klein, MD;  Location: [place];  Service: General;  Laterality: N/A;   MOHS SURGERY Left 1/ 2016   Dr Nevada Crane-- Basal cell   PROSTATE SURGERY     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-C N/A 06/22/2021   Procedure: TRANSURETHRAL RESECTION OF BLADDER TUMOR;  Surgeon: Franchot Gallo, MD;  Location: [facility];  Service: Urology;  Laterality: N/A;   TRANSURETHRAL RESECTION OF PROSTATE N/A 02/26/2013   Procedure: TRANSURETHRAL RESECTION OF THE PROSTATE WITH GYRUS INSTRUMENTS;  Surgeon: Franchot Gallo, MD;  Location: [place];  Service: Urology;  Laterality: N/A;   TRANSURETHRAL RESECTION OF PROSTATE N/A 06/22/2021   Procedure: TRANSURETHRAL RESECTION OF THE PROSTATE (TURP);  Surgeon: Franchot Gallo, MD;  Location: [facility];  Service: Urology;  Laterality: N/A;    There were no vitals filed for this visit.   Subjective Assessment - 10/29/21 1724     Subjective Wife reports that he made coffee this AM, has had some near falls to the back when getting up but feels he is walking better    Currently in Pain? No/denies                               [facility]  Adult PT Treatment/Exercise - 10/29/21 0001       Ambulation/Gait   Gait Comments walking without device, with 3# in left hand, cues for arm swing, large steps and posture      High Level Balance   High Level Balance Activities Negotitating around obstacles;Negotiating over obstacles;Side stepping;Backward walking;Direction changes    High Level Balance Comments standing beach ball batting back and then kicks, bending over reaching to touch cone right and left hand various positions and then cone toe touches, on airex balance and then on ariex back to wall having him toc=uch the wall with his backside and then trying to correct without using his arms, side stepping over half bolster      Lumbar Exercises: Aerobic   Nustep lvl 5 x6 mins      Lumbar Exercises: Standing   Other Standing Lumbar Exercises resisted walking 30# fwd backward a lot of cues to take bigger steps                       PT Short Term Goals - 10/01/21 1322       PT SHORT TERM GOAL #1   Title independent with initial HEP    Time 2    Period Weeks    Status New               PT Long Term Goals - 10/29/21 1611       PT LONG TERM GOAL #1   Title Understand fall risks, the importance of exercise and flexibility to health and function    Status On-going      PT LONG TERM GOAL #2   Title decrease TUG time to 13 seconds    Status Partially Met      PT LONG TERM GOAL #3   Title walk around our building without rest     Status On-going                   Plan - 10/29/21 1724     Clinical Impression Statement Patient had two instances of a LOB that needed Mod/Max A to correct, once he loses his balance he tends to have a really panicked reaction and then hsa trouble moving his feet and arms more of a very fast small festenating type movement.  One he had difficulty stepping over a stick he was to step b/n the sticks but his impulsivity he stepped over both and lost his balance.  The  other LOB was with the resisted walking and doing it side stepping    PT Next Visit Plan continue to work on the function, gait, balance safety and the left LE    Consulted and Agree with  Plan of Care Patient;Family member/caregiver    Family Member Consulted wife [name]             Patient will benefit from skilled therapeutic intervention in order to improve the following deficits and impairments:  Abnormal gait, Decreased coordination, Decreased range of motion, Difficulty walking, Increased muscle spasms, Cardiopulmonary status limiting activity, Decreased endurance, Decreased activity tolerance, Pain, Impaired flexibility, Decreased balance, Decreased mobility, Decreased strength, Postural dysfunction  Visit Diagnosis: Repeated falls  Other abnormalities of gait and mobility  Difficulty in walking, not elsewhere classified  Hemiplegia and hemiparesis following cerebral infarction affecting left non-dominant side (HCC)  Muscle weakness (generalized)     Problem List Patient Active Problem List   Diagnosis Date Noted   Thrush 10/08/2021   Hemiplegia, dominant side S/P CVA (cerebrovascular accident) ([place]) 09/11/2021   Insomnia    Prediabetes    Chronic kidney disease (CKD), stage IV (severe) (HCC)    Basal ganglia infarction ([place]) 07/29/2021   Transaminitis 07/27/2021   UTI (urinary tract infection) 07/27/2021   CVA (cerebral vascular accident) ([place]) 07/27/2021   Fall at home, initial encounter 07/27/2021   Hyperglycemia 07/27/2021   CKD (chronic kidney disease), stage IV ([place]) 07/27/2021   Cholelithiasis 07/27/2021   Hypoxia 07/27/2021   Nausea and vomiting 35/00/9381   Acute metabolic encephalopathy 82/99/3716   Normocytic anemia 07/27/2021   Chronic back pain 07/27/2021   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[place]) 06/22/2021   Closed fracture of first lumbar vertebra with routine healing 02/03/2021   Closed fracture of multiple ribs 11/18/2020    Anxiety 01/29/2020   Leg pain, bilateral 01/29/2020   Ingrown toenail 07/13/2019   Lumbar spondylosis 05/02/2018   Pain in left knee 03/09/2018   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   Trochanteric bursitis of left hip 01/16/2018   Preventative health care 09/26/2017   HTN (hypertension) 07/19/2015   Hyperlipidemia 07/19/2015   Great toe pain 02/11/2014   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 [place], [name], [address] Phone: [phone]   Fax:  [phone]  Name: William [name] MRN: [id number] Date of Birth: 1941/05/25

## 2021-10-29 NOTE — Therapy (Signed)
OUTPATIENT SPEECH LANGUAGE PATHOLOGY TREATMENT NOTE   Patient Name: William [name] MRN: [id number] DOB:[birth year], 81 y.o., male Today's Date: 10/29/2021  PCP: Ann Held, DO  REFERRING PROVIDER: Ann Held, DO   END OF SESSION:   End of Session - 10/28/21 1427     Visit Number 8    Number of Visits 17    Date for SLP Re-Evaluation 12/08/21    SLP Start Time 9476    SLP Stop Time  1500    SLP Time Calculation (min) 40 min    Activity Tolerance Patient tolerated treatment well;No increased pain             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   Mild, related to stroke history   Nocturia    Renal insufficiency 06/25/2013   S/P carotid endarterectomy    BILATERAL ICA--  PATENT PER DUPLEX  05-19-2012   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   Right calf   Urinary frequency    Vitamin D deficiency    Past Surgical History:  Procedure Laterality Date   APPENDECTOMY  AS CHILD   CARDIOVASCULAR STRESS TEST  03-27-2012  DR CRENSHAW   LOW RISK LEXISCAN STUDY-- PROBABLE NORMAL PERFUSION AND SOFT TISSUE ATTENUATION/  NO ISCHEMIA/ EF 51%   CAROTID ENDARTERECTOMY Bilateral LEFT  11-12-2008  DR GREG HAYES   RIGHT ICA  2006  ([facility])   CYSTOSCOPY W/ RETROGRADES Bilateral 06/22/2021   Procedure: CYSTOSCOPY WITH RETROGRADE PYELOGRAM;  Surgeon:  Franchot Gallo, MD;  [facility];  Service: Urology;  Laterality: Bilateral;   CYSTOSCOPY WITH LITHOLAPAXY N/A 02/26/2013   Procedure: CYSTOSCOPY WITH LITHOLAPAXY;  Surgeon: Franchot Gallo, MD;  Location[facility];  Service: Urology;  Laterality: N/A;   EYE SURGERY  Jan. 2016   cataract surgery both eyes   INGUINAL HERNIA REPAIR Right 11-08-2006   IR KYPHO EA ADDL LEVEL THORACIC OR LUMBAR  02/12/2021   IR RADIOLOGIST EVAL & MGMT  02/18/2021   MASS EXCISION N/A 03/03/2016   Procedure: EXCISION OF BACK  MASS;  Surgeon: Stark Klein, MD;  Location: [place];  Service: General;  Laterality: N/A;   MOHS SURGERY Left 1/ 2016   Dr Nevada Crane-- Basal cell   PROSTATE SURGERY     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-C N/A 06/22/2021   Procedure: TRANSURETHRAL RESECTION OF BLADDER TUMOR;  Surgeon: Franchot Gallo, MD;  Location: [facility];  Service: Urology;  Laterality: N/A;   TRANSURETHRAL RESECTION OF PROSTATE N/A 02/26/2013   Procedure: TRANSURETHRAL RESECTION OF THE PROSTATE WITH GYRUS INSTRUMENTS;  Surgeon: Franchot Gallo, MD;  Location: [facility];  Service: Urology;  Laterality: N/A;   TRANSURETHRAL RESECTION OF PROSTATE N/A 06/22/2021  Procedure: TRANSURETHRAL RESECTION OF THE PROSTATE (TURP);  Surgeon: Dahlstedt, Stephen, MD;  [facility];  Service: Urology;  Laterality: N/A;   Patient Active Problem List   Diagnosis Date Noted   Thrush 10/08/2021   Hemiplegia, 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   Insomnia    Prediabetes    Chronic kidney disease (CKD), stage IV (severe) (HCC)    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   Transaminitis 07/27/2021   UTI (urinary tract infection) 07/27/2021   CVA (cerebral vascular accident) (HCC) 07/27/2021   Fall at home, initial encounter 07/27/2021   Hyperglycemia 07/27/2021   CKD  (chronic kidney disease), stage IV (HCC) 07/27/2021   Cholelithiasis 07/27/2021   Hypoxia 07/27/2021   Nausea and vomiting 07/27/2021   Acute metabolic encephalopathy 07/27/2021   Normocytic anemia 07/27/2021   Chronic back pain 07/27/2021   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   Closed fracture of first lumbar vertebra with routine healing 02/03/2021   Closed fracture of multiple ribs 11/18/2020   Anxiety 01/29/2020   Leg pain, bilateral 01/29/2020   Ingrown toenail 07/13/2019   Lumbar spondylosis 05/02/2018   Pain in left knee 03/09/2018   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   Trochanteric bursitis of left hip 01/16/2018   Preventative health care 09/26/2017   HTN (hypertension) 07/19/2015   Hyperlipidemia 07/19/2015   Great toe pain 02/11/2014   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   Obesity (BMI 30-39.9) 06/25/2013   Renal insufficiency 06/25/2013   Weakness of left arm 06/25/2013   Sebaceous cyst 03/03/2011   Sprain of lumbar region 07/31/2010   Rib pain, left 08/29/2009   Carotid artery stenosis, asymptomatic, bilateral 05/02/2009   Eczema, atopic 05/31/2008   Vitamin D deficiency 03/01/2008   BPH (benign prostatic hyperplasia) 08/06/2007   Fasting hyperglycemia 12/21/2006   History of right MCA infarct 2006    ONSET DATE: 07/27/21  REFERRING DIAG: I63.9 (ICD-10-CM) - Cerebrovascular accident (CVA), unspecified mechanism (HCC)   THERAPY DIAG:  Cognitive communication deficit  SUBJECTIVE: Pt was more alert and less confused today.  PAIN:  Are you having pain? No     OBJECTIVE:   TODAY'S TREATMENT: Pt was seen for skilled ST services with focus on cognition. SLP facilitated increased receptive language and attention during organization task. Pt was provided with a list of words and 6 categories to sort words into. Pt required modA to follow directions, as well as remain organized/methodical when completing tsak.  SLP encouraged pt to review work to identify errors. Pt was able to organize items with minA verbal cues. Provided HEP. Cont with current POC.   PATIENT EDUCATION: Education details: Cognitive-communication impairment Person educated: Patient and Spouse Education method: Explanation and Demonstration Education comprehension: verbalized understanding, verbal cues required, and needs further education       GOALS: Goals reviewed with patient? Yes   SHORT TERM GOALS: Target date:    Patient will demonstrate orientation x4 with modA verbal cues to refer to compensatory aids. Baseline: Goal status: MET   2.  Patient/wife will recall safe swallow strategies to use at home to increase safety with consuming food/drink PO with minA.  Baseline:  Goal status: INITIAL   3.  Pt will comprehend 1-step directions with 80% accuracy given less than 2 verbal cues. Baseline:  Goal status: INITIAL   4.   Pt will attend to cognitive-communication task for 5 minutes given less than 2 verbal cues to increase safety. Baseline:    Goal status: INITIAL       LONG TERM GOALS: Target date: 12/03/2021   Wife will report successful use of communication strategies at home to decrease communication breakdowns independently. Baseline:  Goal status: INITIAL   2.  Pt will demonstrate orientation x4 with supA verbal cues.  Baseline:  Goal status: INITIAL   3.  Pt will follow simple, 2-step directions with 80% accuracy given less than 2 verbal cues. Baseline:  Goal status: INITIAL   4.  Pt will attend to cognitive-communication task for 10 minutes given less than 2 verbal cues to increase safety. Baseline:  Goal status: INITIAL     ASSESSMENT:   CLINICAL IMPRESSION: See tx note.  SLP rec skilled ST services to address cog-comm impairment to maximize functional communication and decrease caregiver burden. Continue with current POC.     OBJECTIVE IMPAIRMENTS include attention, memory,  awareness, executive functioning, dysarthria, and dysphagia. These impairments are limiting patient from ADLs/IADLs, effectively communicating at home and in community, and safety when swallowing. Factors affecting potential to achieve goals and functional outcome are ability to learn/carryover information and severity of impairments.. Patient will benefit from skilled SLP services to address above impairments and improve overall function.   REHAB POTENTIAL: Good   PLAN: SLP FREQUENCY: 2x/week   SLP DURATION: 8 weeks   PLANNED INTERVENTIONS: Aspiration precaution training, Environmental controls, Cueing hierachy, Cognitive reorganization, Internal/external aids, Functional tasks, SLP instruction and feedback, Compensatory strategies, and Patient/family education     [place], [place] 10/29/2021, 1:48 PM

## 2021-11-01 NOTE — Therapy (Signed)
OUTPATIENT OCCUPATIONAL THERAPY TREATMENT NOTE   Patient Name: William [name] MRN: [id number] DOB:Mar 14, 1941, 81 y.o., male Today's Date: 11/01/2021  PCP: Roma Schanz, DO REFERRING PROVIDER: Carollee Herter, Alferd Apa, DO  END OF SESSION:     OT End of Session - 10/29/21 1604      Visit Number 5    Number of Visits 13     Date for OT Re-Evaluation 12/14/21     Authorization Type [employer]     Authorization Time Period VL: [place]     Progress Note Due on Visit 10     OT Start Time 1530     OT Stop Time 1614     OT Time Calculation (min) 44 min     Activity Tolerance Patient tolerated treatment well   somewhat limited by activity tolerance    Behavior During Therapy [facility] for tasks assessed/performed;Restless;Flat affect            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   Mild, related to stroke history   Nocturia    Renal insufficiency 06/25/2013   S/P carotid endarterectomy    BILATERAL ICA--  PATENT PER DUPLEX  05-19-2012   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   Right calf   Urinary frequency    Vitamin D deficiency    Past Surgical History:  Procedure Laterality Date   APPENDECTOMY  AS CHILD   CARDIOVASCULAR STRESS TEST  03-27-2012  DR CRENSHAW   LOW RISK LEXISCAN STUDY-- PROBABLE NORMAL PERFUSION AND SOFT TISSUE ATTENUATION/  NO  ISCHEMIA/ EF 51%   CAROTID ENDARTERECTOMY Bilateral LEFT  11-12-2008  DR GREG HAYES   RIGHT ICA  2006  ([facility])   CYSTOSCOPY W/ RETROGRADES Bilateral 06/22/2021   Procedure: CYSTOSCOPY WITH RETROGRADE PYELOGRAM;  Surgeon: Franchot Gallo, MD;  Location: [facility];  Service: Urology;  Laterality: Bilateral;   CYSTOSCOPY WITH LITHOLAPAXY N/A 02/26/2013   Procedure: CYSTOSCOPY WITH LITHOLAPAXY;  Surgeon: Franchot Gallo, MD;  Location: [facility];  Service: Urology;  Laterality: N/A;   EYE SURGERY  Jan. 2016   cataract surgery both eyes   INGUINAL HERNIA REPAIR Right 11-08-2006   IR KYPHO EA ADDL LEVEL THORACIC OR LUMBAR  02/12/2021   IR RADIOLOGIST EVAL & MGMT  02/18/2021   MASS EXCISION N/A 03/03/2016   Procedure: EXCISION OF BACK  MASS;  Surgeon: Stark Klein, MD;  Location: [place];  Service: General;  Laterality: N/A;   MOHS SURGERY Left 1/ 2016   Dr Nevada Crane-- Basal cell   PROSTATE SURGERY     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-C N/A 06/22/2021   Procedure: TRANSURETHRAL RESECTION OF BLADDER TUMOR;  Surgeon: Franchot Gallo, MD;  Location: [facility];  Service: Urology;  Laterality:  N/A;   TRANSURETHRAL RESECTION OF PROSTATE N/A 02/26/2013   Procedure: TRANSURETHRAL RESECTION OF THE PROSTATE WITH GYRUS INSTRUMENTS;  Surgeon: Franchot Gallo, MD;  Location: [facility];  Service: Urology;  Laterality: N/A;   TRANSURETHRAL RESECTION OF PROSTATE N/A 06/22/2021   Procedure: TRANSURETHRAL RESECTION OF THE PROSTATE (TURP);  Surgeon: Franchot Gallo, MD;  Location: [facility];  Service: Urology;  Laterality: N/A;   Patient Active Problem List   Diagnosis Date Noted   Thrush 10/08/2021   Hemiplegia, dominant side S/P CVA (cerebrovascular accident) ([place]) 09/11/2021   Insomnia    Prediabetes    Chronic kidney disease (CKD), stage IV (severe) (HCC)    Basal ganglia infarction  ([place]) 07/29/2021   Transaminitis 07/27/2021   UTI (urinary tract infection) 07/27/2021   CVA (cerebral vascular accident) ([place]) 07/27/2021   Fall at home, initial encounter 07/27/2021   Hyperglycemia 07/27/2021   CKD (chronic kidney disease), stage IV ([place]) 07/27/2021   Cholelithiasis 07/27/2021   Hypoxia 07/27/2021   Nausea and vomiting 62/13/0865   Acute metabolic encephalopathy 78/46/9629   Normocytic anemia 07/27/2021   Chronic back pain 07/27/2021   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[place]) 06/22/2021   Closed fracture of first lumbar vertebra with routine healing 02/03/2021   Closed fracture of multiple ribs 11/18/2020   Anxiety 01/29/2020   Leg pain, bilateral 01/29/2020   Ingrown toenail 07/13/2019   Lumbar spondylosis 05/02/2018   Pain in left knee 03/09/2018   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   Trochanteric bursitis of left hip 01/16/2018   Preventative health care 09/26/2017   HTN (hypertension) 07/19/2015   Hyperlipidemia 07/19/2015   Great toe pain 02/11/2014   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   Obesity (BMI 30-39.9) 06/25/2013   Renal insufficiency 06/25/2013   Weakness of left arm 06/25/2013   Sebaceous cyst 03/03/2011   Sprain of lumbar region 07/31/2010   Rib pain, left 08/29/2009   Carotid artery stenosis, asymptomatic, bilateral 05/02/2009   Eczema, atopic 05/31/2008   Vitamin D deficiency 03/01/2008   BPH (benign prostatic hyperplasia) 08/06/2007   Fasting hyperglycemia 12/21/2006   History of right MCA infarct 2006    ONSET DATE: 07/27/21  REFERRING DIAG: R41.4 (ICD-10-CM) - Hemi-inattention I63.9 (ICD-10-CM) - Cerebrovascular accident (CVA), unspecified mechanism ([place])     THERAPY DIAG:  Attention and concentration deficit  Repeated falls  Other abnormalities of gait and mobility  Other lack of coordination  Other symptoms and signs involving cognitive functions following cerebral  infarction   PERTINENT HISTORY: L basal ganglia and corona radiata infarct 07/27/21 (had documented AMS for about 5 days prior w/ hospital work-up including MRI negative and symptoms attributed to a UTI); L lower homonymous quadrantanopia. Other PMH includes R MCA infarct in 2006 w/ L-sided hemiparesis, NCD, carotid artery disease (bilateral), HTN, HLD,CKD3, and hx of bladder cancer  PRECAUTIONS: Fall risk  SUBJECTIVE:   SUBJECTIVE ASSESSMENT: Pt and his wife report he has been making his own coffee in the morning!  PAIN: Are you having pain? No   OBJECTIVE:   TODAY'S TREATMENT: Sit-to-stands completed 2x5 w/ pt leaning forward to retrieve small physioball from ground level when transitioning to standing and then returning ball back to ground when transitioning back to sitting EOM. Pt demonstrated great anterior weight shift to stand each rep w/ min cues for sequencing w/ ball; no LOB or additional difficulty w/ sit-to-stand transition Engaged in dynamic reaching in diagonal patterns with alternating R and L UE while  in standing to tap specified color targets positioned at head height and overhead height. Required min cues to tap targets on L side w/ LUE; success improved w/ repetition and pt demonstrated no LOB throughout activity Practiced doffing/donning button-up shirt while seated EOM; after multiple attempts, able to complete w/ Min A in 2/2 trials. OT provided up/downgrading prn, extended time for processing, verbal cues for sequencing, and incorporation of knowledge of results. Most difficulty w/ pulling shirt around to R side after threading LUE into sleeve. Attempted alternate method, donning sleeves first then pulling overhead, w/out success.   PATIENT EDUCATION: Continued condition-specific education Person educated: Patient and Spouse Education method: Explanation Education comprehension: verbalized understanding   GOALS: Goals reviewed with patient? Yes   SHORT TERM  GOALS: Target date:    STG   Status:  1 Pt/caregiver will verbalize understanding of visual perceptual compensatory strategies Baseline: Decreased knowledge of visual deficits and strategies On-going  2 Pt will complete symbol cancellation task w/ at least 50% accuracy using visual compensatory strategies to indicate improved attention Baseline: 40% accuracy w/ multiple errors On-going  3 Pt will participate in bilateral coordination activity w/ no more than 3 cues for incorporation of LUE Baseline: L-sided inattention On-going      LONG TERM GOALS: Target date: 11/26/2021   LTG   Status:  1 Pt will be able to thread LUE into shirt sleeve w/ SPV in at least 1/2 trials Baseline: Min A to don/doff shirt sleeve on L side On-going  2 Pt will be able to thread bottoms on BLEs w/ Mod A Baseline: Max A for LB dressing On-going  3 Pt will demonstrate supine-to-sit transition w/ cues from caregiver and incorporation of AE/DME prn Baseline: Mod-Max A w/ bed mobility On-going  4 Pt will be able to use TV remote (or adaptive remote) to complete 1 task (e.g., turn TV on/of, turn volume up/down) w/ SPV Baseline: Difficulty  On-going  5 Pt will increase Barthel Index score by at least 5 to indicate improved level of participation in BADLs Baseline: 60/100 On-going      ASSESSMENT:   CLINICAL IMPRESSION: OT addressed core strengthening and sit-to-stand transitions considering reported retropulsion, incorporating external aids to facilitate effective weight shifting. Pt demonstrated great balance this session and was able to complete majority of sit-to-stand transitions w/out UE support throughout session. During UB dressing activity, OT attempted to incorporate a mirror as a visual aid to assist w/ components of donning/doffing an open-front shirt w/ negative results; success increased w/ repetition and appropriate cues. Pt continued to respond positively to errorless learning and incorporation  of knowledge of results during tasks, as well as verbal cues for increased LUE engagement into tasks prn.   PERFORMANCE DEFICITS in functional skills including ADLs, IADLs, coordination, dexterity, [employer], GMC, mobility, balance, and vision, cognitive skills including attention, memory, orientation, problem solving, safety awareness, sequencing, and temperament/personality, and psychosocial skills including environmental adaptation, interpersonal interactions, and routines and behaviors.    IMPAIRMENTS are limiting patient from ADLs, IADLs, rest and sleep, leisure, and social participation.    COMORBIDITIES may have co-morbidities  that affects occupational performance. Patient will benefit from skilled OT to address above impairments and improve overall function.     PLAN: OT FREQUENCY: 2x/week   OT DURATION: 6 weeks   PLANNED INTERVENTIONS: self care/ADL training, therapeutic exercise, therapeutic activity, neuromuscular re-education, balance training, functional mobility training, aquatic therapy, splinting, moist heat, cryotherapy, patient/family education, cognitive remediation/compensation, visual/perceptual remediation/compensation, psychosocial skills training, energy conservation,  and DME and/or AE instructions   RECOMMENDED OTHER SERVICES: Currently receiving PT/ST services at this location   CONSULTED AND AGREED WITH PLAN OF CARE: Patient and family member/caregiver   PLAN FOR NEXT SESSION: Continue w/ task oriented training (practicing instant coffee, supine to sit/sit-to-stand transitions, dressing tasks); balance/standing tolerance; L-sided attention and bilateral tasks   Kathrine Cords, MSOT, OTR/L 10/29/2021, 4:44 PM

## 2021-11-02 ENCOUNTER — Encounter: Payer: Self-pay | Admitting: Occupational Therapy

## 2021-11-02 ENCOUNTER — Ambulatory Visit: Payer: PPO | Admitting: Occupational Therapy

## 2021-11-02 DIAGNOSIS — I69354 Hemiplegia and hemiparesis following cerebral infarction affecting left non-dominant side: Secondary | ICD-10-CM | POA: Diagnosis not present

## 2021-11-02 DIAGNOSIS — I69318 Other symptoms and signs involving cognitive functions following cerebral infarction: Secondary | ICD-10-CM

## 2021-11-02 DIAGNOSIS — R278 Other lack of coordination: Secondary | ICD-10-CM

## 2021-11-02 DIAGNOSIS — R296 Repeated falls: Secondary | ICD-10-CM

## 2021-11-02 DIAGNOSIS — R2689 Other abnormalities of gait and mobility: Secondary | ICD-10-CM

## 2021-11-02 DIAGNOSIS — R41840 Attention and concentration deficit: Secondary | ICD-10-CM

## 2021-11-02 NOTE — Therapy (Unsigned)
OUTPATIENT OCCUPATIONAL THERAPY TREATMENT NOTE   Patient Name: William [name] MRN: [id number] DOB:[birth year], 81 y.o., male Today's Date: 11/02/2021  PCP: Roma Schanz, DO REFERRING PROVIDER: Carollee Herter, Alferd Apa, DO   OT End of Session - 11/02/21 1543     Visit Number 6    Number of Visits 13    Date for OT Re-Evaluation 12/14/21    Authorization Type [employer]    Authorization Time Period VL: [place]    Progress Note Due on Visit 10    OT Start Time 1540    OT Stop Time 1620    OT Time Calculation (min) 40 min    Activity Tolerance Patient tolerated treatment well   somewhat limited by activity tolerance   Behavior During Therapy [facility] for tasks assessed/performed;Restless;Flat affect            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   Mild, related to stroke history   Nocturia    Renal insufficiency 06/25/2013   S/P carotid endarterectomy    BILATERAL ICA--  PATENT PER DUPLEX  05-19-2012   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   Right calf   Urinary frequency    Vitamin D deficiency    Past Surgical History:  Procedure Laterality Date   APPENDECTOMY  AS CHILD   CARDIOVASCULAR STRESS TEST  03-27-2012  DR CRENSHAW   LOW RISK LEXISCAN STUDY-- PROBABLE NORMAL PERFUSION AND SOFT TISSUE ATTENUATION/  NO ISCHEMIA/ EF 51%   CAROTID  ENDARTERECTOMY Bilateral LEFT  11-12-2008  DR GREG HAYES   RIGHT ICA  2006  ([facility])   CYSTOSCOPY W/ RETROGRADES Bilateral 06/22/2021   Procedure: CYSTOSCOPY WITH RETROGRADE PYELOGRAM;  Surgeon: Franchot Gallo, MD;  Location: [facility];  Service: Urology;  Laterality: Bilateral;   CYSTOSCOPY WITH LITHOLAPAXY N/A 02/26/2013   Procedure: CYSTOSCOPY WITH LITHOLAPAXY;  Surgeon: Franchot Gallo, MD;  Location: [facility];  Service: Urology;  Laterality: N/A;   EYE SURGERY  Jan. 2016   cataract surgery both eyes   INGUINAL HERNIA REPAIR Right 11-08-2006   IR KYPHO EA ADDL LEVEL THORACIC OR LUMBAR  02/12/2021   IR RADIOLOGIST EVAL & MGMT  02/18/2021   MASS EXCISION N/A 03/03/2016   Procedure: EXCISION OF BACK  MASS;  Surgeon: Stark Klein, MD;  Location: [place];  Service: General;  Laterality: N/A;   MOHS SURGERY Left 1/ 2016   Dr Nevada Crane-- Basal cell   PROSTATE SURGERY     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-C N/A 06/22/2021   Procedure: TRANSURETHRAL RESECTION OF BLADDER TUMOR;  Surgeon: Franchot Gallo, MD;  Location: [facility];  Service: Urology;  Laterality: N/A;   TRANSURETHRAL RESECTION OF PROSTATE N/A 02/26/2013   Procedure: TRANSURETHRAL RESECTION OF THE  PROSTATE WITH GYRUS INSTRUMENTS;  Surgeon: Franchot Gallo, MD;  Location: [facility];  Service: Urology;  Laterality: N/A;   TRANSURETHRAL RESECTION OF PROSTATE N/A 06/22/2021   Procedure: TRANSURETHRAL RESECTION OF THE PROSTATE (TURP);  Surgeon: Franchot Gallo, MD;  Location: [facility];  Service: Urology;  Laterality: N/A;   Patient Active Problem List   Diagnosis Date Noted   Thrush 10/08/2021   Hemiplegia, dominant side S/P CVA (cerebrovascular accident) ([place]) 09/11/2021   Insomnia    Prediabetes    Chronic kidney disease (CKD), stage IV (severe) (HCC)    Basal ganglia infarction ([place]) 07/29/2021    Transaminitis 07/27/2021   UTI (urinary tract infection) 07/27/2021   CVA (cerebral vascular accident) ([place]) 07/27/2021   Fall at home, initial encounter 07/27/2021   Hyperglycemia 07/27/2021   CKD (chronic kidney disease), stage IV ([place]) 07/27/2021   Cholelithiasis 07/27/2021   Hypoxia 07/27/2021   Nausea and vomiting 42/70/6237   Acute metabolic encephalopathy 62/83/1517   Normocytic anemia 07/27/2021   Chronic back pain 07/27/2021   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[facility]) 06/22/2021   Closed fracture of first lumbar vertebra with routine healing 02/03/2021   Closed fracture of multiple ribs 11/18/2020   Anxiety 01/29/2020   Leg pain, bilateral 01/29/2020   Ingrown toenail 07/13/2019   Lumbar spondylosis 05/02/2018   Pain in left knee 03/09/2018   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   Trochanteric bursitis of left hip 01/16/2018   Preventative health care 09/26/2017   HTN (hypertension) 07/19/2015   Hyperlipidemia 07/19/2015   Great toe pain 02/11/2014   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   Obesity (BMI 30-39.9) 06/25/2013   Renal insufficiency 06/25/2013   Weakness of left arm 06/25/2013   Sebaceous cyst 03/03/2011   Sprain of lumbar region 07/31/2010   Rib pain, left 08/29/2009   Carotid artery stenosis, asymptomatic, bilateral 05/02/2009   Eczema, atopic 05/31/2008   Vitamin D deficiency 03/01/2008   BPH (benign prostatic hyperplasia) 08/06/2007   Fasting hyperglycemia 12/21/2006   History of right MCA infarct 2006    ONSET DATE: 07/27/21  REFERRING DIAG: R41.4 (ICD-10-CM) - Hemi-inattention I63.9 (ICD-10-CM) - Cerebrovascular accident (CVA), unspecified mechanism ([place])     THERAPY DIAG:  Attention and concentration deficit  Repeated falls  Other abnormalities of gait and mobility  Other lack of coordination  Other symptoms and signs involving cognitive functions following cerebral infarction   PERTINENT HISTORY: L  basal ganglia and corona radiata infarct 07/27/21 (had documented AMS for about 5 days prior w/ hospital work-up including MRI negative and symptoms attributed to a UTI); L lower homonymous quadrantanopia. Other PMH includes R MCA infarct in 2006 w/ L-sided hemiparesis, NCD, carotid artery disease (bilateral), HTN, HLD,CKD3, and hx of bladder cancer  PRECAUTIONS: Fall risk  SUBJECTIVE:   SUBJECTIVE ASSESSMENT: Pt reports having a pain on his L side (indicated L lumbar region of abdomen); "someone speared me"  PAIN: Are you having pain? No   OBJECTIVE:   TODAY'S TREATMENT: Constructional activity w/ PVC pipe tree used as preparatory activity to facilitate bilateral integration, focusing on incorporation of LUE, and attention. OT graded task down by providing pt w/ appropriate pieces and indicating correct positioning on structure; pt able to complete activity w/ moderate multimodal cues for connecting pieces correctly, incorporating L hand, and pulling pieces back apart completely after construction Simulated bed mobility, focusing on transition from supine to seated position. Attempted x1 transitioning to L side and x1 transitioning to R  side. Pt able to transition to supine from sitting EOM w/ SPV, then requiring 1-step verbal/tactile instruction for repositioning; success repositioning BLEs w/ difficulty adjusting trunk. Pt then able to roll to sidelying and position legs to return to seated position w/ extended time and cues, but was unable to push w/ UEs and engage core to return to an upright position despite trialing various methods and levels of assistance from OT, ultimately returning to sit EOM w/ Max A. Pt's wife facilitated transition back to sitting EOM during 2nd trial w/ pt again requiring Max A to return to upright position.   PATIENT EDUCATION: Continued condition-specific education, particularly related to potentially beneficial cues during bed mobility and options for DME/AE  prn; will continue to trial options in upcoming sessions Person educated: Patient and Spouse Education method: Explanation and online examples Education comprehension: verbalized understanding   GOALS: Goals reviewed with patient? Yes   SHORT TERM GOALS: Target date:    STG   Status:  1 Pt/caregiver will verbalize understanding of visual perceptual compensatory strategies Baseline: Decreased knowledge of visual deficits and strategies On-going  2 Pt will complete symbol cancellation task w/ at least 50% accuracy using visual compensatory strategies to indicate improved attention Baseline: 40% accuracy w/ multiple errors On-going  3 Pt will participate in bilateral coordination activity w/ no more than 3 cues for incorporation of LUE Baseline: L-sided inattention On-going      LONG TERM GOALS: Target date: 11/26/2021   LTG   Status:  1 Pt will be able to thread LUE into shirt sleeve w/ SPV in at least 1/2 trials Baseline: Min A to don/doff shirt sleeve on L side On-going  2 Pt will be able to thread bottoms on BLEs w/ Mod A Baseline: Max A for LB dressing On-going  3 Pt will demonstrate supine-to-sit transition w/ cues from caregiver and incorporation of AE/DME prn Baseline: Mod-Max A w/ bed mobility On-going  4 Pt will be able to use TV remote (or adaptive remote) to complete 1 task (e.g., turn TV on/of, turn volume up/down) w/ SPV Baseline: Difficulty  On-going  5 Pt will increase Barthel Index score by at least 5 to indicate improved level of participation in BADLs Baseline: 60/100 On-going      ASSESSMENT:   CLINICAL IMPRESSION: Pt continues to respond positively to errorless learning, extended time, and incorporation of knowledge of results during tasks, as well as verbal and tactile cues for increased LUE engagement into tasks prn. Due to pt's spouse's report of increased difficulty w/ bed mobility, OT addressed this in session, simulating transitions on therapy mat.  Pt did well w/ transition from seated position to supine, responding well to simple 1-step verbal/tactile cues for repositioning once supine. Difficulty increases when attempting to return back to upright position. OT attempted various methods to facilitate this transition w/ pt demonstrating both reluctance to try unfamiliar methods as well as decreased core and BUE strength. Success may also have impacted due to decreased ability to raise head of bed, which pt's spouse reports they incorporate w/ this transition at home.   PERFORMANCE DEFICITS in functional skills including ADLs, IADLs, coordination, dexterity, [place], GMC, mobility, balance, and vision, cognitive skills including attention, memory, orientation, problem solving, safety awareness, sequencing, and temperament/personality, and psychosocial skills including environmental adaptation, interpersonal interactions, and routines and behaviors.    IMPAIRMENTS are limiting patient from ADLs, IADLs, rest and sleep, leisure, and social participation.    COMORBIDITIES may have co-morbidities  that affects occupational  performance. Patient will benefit from skilled OT to address above impairments and improve overall function.     PLAN: OT FREQUENCY: 2x/week   OT DURATION: 6 weeks   PLANNED INTERVENTIONS: self care/ADL training, therapeutic exercise, therapeutic activity, neuromuscular re-education, balance training, functional mobility training, aquatic therapy, splinting, moist heat, cryotherapy, patient/family education, cognitive remediation/compensation, visual/perceptual remediation/compensation, psychosocial skills training, energy conservation, and DME and/or AE instructions   RECOMMENDED OTHER SERVICES: Currently receiving PT/ST services at this location   CONSULTED AND AGREED WITH PLAN OF CARE: Patient and family member/caregiver   PLAN FOR NEXT SESSION: Bed mobility (trial on mat w/ ability to elevate HOB; bed ladder; bed rail);  continue w/ task oriented training (practicing instant coffee, dressing tasks); continue to address retropulsion and L-sided attention/bilateral tasks   Kathrine Cords, MSOT, OTR/L 11/02/2021, 4:44 PM

## 2021-11-03 ENCOUNTER — Ambulatory Visit: Payer: PPO | Admitting: Speech Pathology

## 2021-11-03 ENCOUNTER — Encounter: Payer: Self-pay | Admitting: Speech Pathology

## 2021-11-03 ENCOUNTER — Ambulatory Visit: Payer: PPO | Admitting: Physical Therapy

## 2021-11-03 ENCOUNTER — Ambulatory Visit: Payer: PPO | Admitting: Occupational Therapy

## 2021-11-03 ENCOUNTER — Encounter: Payer: Self-pay | Admitting: Physical Therapy

## 2021-11-03 DIAGNOSIS — I69354 Hemiplegia and hemiparesis following cerebral infarction affecting left non-dominant side: Secondary | ICD-10-CM

## 2021-11-03 DIAGNOSIS — R41841 Cognitive communication deficit: Secondary | ICD-10-CM

## 2021-11-03 DIAGNOSIS — R278 Other lack of coordination: Secondary | ICD-10-CM

## 2021-11-03 DIAGNOSIS — R262 Difficulty in walking, not elsewhere classified: Secondary | ICD-10-CM

## 2021-11-03 DIAGNOSIS — R2689 Other abnormalities of gait and mobility: Secondary | ICD-10-CM

## 2021-11-03 DIAGNOSIS — R296 Repeated falls: Secondary | ICD-10-CM

## 2021-11-03 DIAGNOSIS — M6281 Muscle weakness (generalized): Secondary | ICD-10-CM

## 2021-11-03 NOTE — Therapy (Signed)
OUTPATIENT SPEECH LANGUAGE PATHOLOGY TREATMENT NOTE   Patient Name: William [name] MRN: [id number] DOB:1940/08/06, 81 y.o., male Today's Date: 11/03/2021  PCP: Ann Held, DO  REFERRING PROVIDER: Carollee Herter, Alferd Apa, DO   END OF SESSION:   End of Session - 11/03/21 1454     Visit Number 9    Number of Visits 17    Date for SLP Re-Evaluation 12/08/21    SLP Start Time 1450    SLP Stop Time  5427    SLP Time Calculation (min) 40 min    Activity Tolerance Patient tolerated treatment well;No increased pain             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   Mild, related to stroke history   Nocturia    Renal insufficiency 06/25/2013   S/P carotid endarterectomy    BILATERAL ICA--  PATENT PER DUPLEX  05-19-2012   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   Right calf   Urinary frequency    Vitamin D deficiency    Past Surgical History:  Procedure Laterality Date   APPENDECTOMY  AS CHILD   CARDIOVASCULAR STRESS TEST  03-27-2012  DR CRENSHAW   LOW RISK LEXISCAN STUDY-- PROBABLE NORMAL PERFUSION AND SOFT TISSUE ATTENUATION/  NO ISCHEMIA/ EF 51%   CAROTID ENDARTERECTOMY Bilateral LEFT  11-12-2008  DR GREG HAYES   RIGHT ICA  2006  ([facility])   CYSTOSCOPY W/ RETROGRADES Bilateral 06/22/2021   Procedure: CYSTOSCOPY WITH RETROGRADE PYELOGRAM;  Surgeon:  Franchot Gallo, MD;  Location[facility];  Service: Urology;  Laterality: Bilateral;   CYSTOSCOPY WITH LITHOLAPAXY N/A 02/26/2013   Procedure: CYSTOSCOPY WITH LITHOLAPAXY;  Surgeon: Franchot Gallo, MD;  Location: [facility];  Service: Urology;  Laterality: N/A;   EYE SURGERY  Jan. 2016   cataract surgery both eyes   INGUINAL HERNIA REPAIR Right 11-08-2006   IR KYPHO EA ADDL LEVEL THORACIC OR LUMBAR  02/12/2021   IR RADIOLOGIST EVAL & MGMT  02/18/2021   MASS EXCISION N/A 03/03/2016   Procedure: EXCISION OF BACK  MASS;  Surgeon: Stark Klein, MD;  Location: [place];  Service: General;  Laterality: N/A;   MOHS SURGERY Left 1/ 2016   Dr Nevada Crane-- Basal cell   PROSTATE SURGERY     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-C N/A 06/22/2021   Procedure: TRANSURETHRAL RESECTION OF BLADDER TUMOR;  Surgeon: Franchot Gallo, MD;  Location: [facility];  Service: Urology;  Laterality: N/A;   TRANSURETHRAL RESECTION OF PROSTATE N/A 02/26/2013   Procedure: TRANSURETHRAL RESECTION OF THE PROSTATE WITH GYRUS INSTRUMENTS;  Surgeon: Franchot Gallo, MD;  Location: [facility];  Service: Urology;  Laterality: N/A;   TRANSURETHRAL RESECTION OF PROSTATE N/A 06/22/2021  Procedure: TRANSURETHRAL RESECTION OF THE PROSTATE (TURP);  Surgeon: Franchot Gallo, MD;  Location: [facility];  Service: Urology;  Laterality: N/A;   Patient Active Problem List   Diagnosis Date Noted   Thrush 10/08/2021   Hemiplegia, dominant side S/P CVA (cerebrovascular accident) ([place]) 09/11/2021   Insomnia    Prediabetes    Chronic kidney disease (CKD), stage IV (severe) (HCC)    Basal ganglia infarction ([place]) 07/29/2021   Transaminitis 07/27/2021   UTI (urinary tract infection) 07/27/2021   CVA (cerebral vascular accident) ([place]) 07/27/2021   Fall at home, initial encounter 07/27/2021   Hyperglycemia 07/27/2021   CKD  (chronic kidney disease), stage IV ([place]) 07/27/2021   Cholelithiasis 07/27/2021   Hypoxia 07/27/2021   Nausea and vomiting 23/30/0762   Acute metabolic encephalopathy 26/33/3545   Normocytic anemia 07/27/2021   Chronic back pain 07/27/2021   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[place]) 06/22/2021   Closed fracture of first lumbar vertebra with routine healing 02/03/2021   Closed fracture of multiple ribs 11/18/2020   Anxiety 01/29/2020   Leg pain, bilateral 01/29/2020   Ingrown toenail 07/13/2019   Lumbar spondylosis 05/02/2018   Pain in left knee 03/09/2018   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   Trochanteric bursitis of left hip 01/16/2018   Preventative health care 09/26/2017   HTN (hypertension) 07/19/2015   Hyperlipidemia 07/19/2015   Great toe pain 02/11/2014   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   Obesity (BMI 30-39.9) 06/25/2013   Renal insufficiency 06/25/2013   Weakness of left arm 06/25/2013   Sebaceous cyst 03/03/2011   Sprain of lumbar region 07/31/2010   Rib pain, left 08/29/2009   Carotid artery stenosis, asymptomatic, bilateral 05/02/2009   Eczema, atopic 05/31/2008   Vitamin D deficiency 03/01/2008   BPH (benign prostatic hyperplasia) 08/06/2007   Fasting hyperglycemia 12/21/2006   History of right MCA infarct 2006    ONSET DATE: 07/27/21  REFERRING DIAG: I63.9 (ICD-10-CM) - Cerebrovascular accident (CVA), unspecified mechanism ([place])   THERAPY DIAG:  Cognitive communication deficit  SUBJECTIVE: Pt was more alert and less confused today.  PAIN:  Are you having pain? No     OBJECTIVE:   TODAY'S TREATMENT: Pt was seen for skilled ST services with focus on cognition. Pt wife reports dysphagia has improved, as well as, has been using comunication strategies  (redirection)   PATIENT EDUCATION: Education details: Cognitive-communication impairment Person educated: Patient and Spouse Education method: Holiday representative Education comprehension: verbalized understanding, verbal cues required, and needs further education       GOALS: Goals reviewed with patient? Yes   SHORT TERM GOALS: Target date: 11/03/2021   Patient will demonstrate orientation x4 with modA verbal cues to refer to compensatory aids. Baseline: Goal status: MET   2.  Patient/wife will recall safe swallow strategies to use at home to increase safety with consuming food/drink PO with minA.  Baseline:  Goal status: INITIAL   3.  Pt will comprehend 1-step directions with 80% accuracy given less than 2 verbal cues. Baseline:  Goal status: INITIAL   4.   Pt will attend to cognitive-communication task for 5 minutes given less than 2 verbal cues to increase safety. Baseline:  Goal status: INITIAL       LONG TERM GOALS: Target date: 12/03/2021   Wife will report successful use of communication strategies at home to decrease communication breakdowns independently. Baseline:  Goal status: INITIAL   2.  Pt will demonstrate orientation x4 with supA  verbal cues.  Baseline:  Goal status: INITIAL   3.  Pt will follow simple, 2-step directions with 80% accuracy given less than 2 verbal cues. Baseline:  Goal status: INITIAL   4.  Pt will attend to cognitive-communication task for 10 minutes given less than 2 verbal cues to increase safety. Baseline:  Goal status: INITIAL     ASSESSMENT:   CLINICAL IMPRESSION: See tx note.  SLP rec skilled ST services to address cog-comm impairment to maximize functional communication and decrease caregiver burden. Continue with current POC.     OBJECTIVE IMPAIRMENTS include attention, memory, awareness, executive functioning, dysarthria, and dysphagia. These impairments are limiting patient from ADLs/IADLs, effectively communicating at home and in community, and safety when swallowing. Factors affecting potential to achieve goals and functional outcome are ability to learn/carryover  information and severity of impairments.. Patient will benefit from skilled SLP services to address above impairments and improve overall function.   REHAB POTENTIAL: Good   PLAN: SLP FREQUENCY: 2x/week   SLP DURATION: 8 weeks   PLANNED INTERVENTIONS: Aspiration precaution training, Environmental controls, Cueing hierachy, Cognitive reorganization, Internal/external aids, Functional tasks, SLP instruction and feedback, Compensatory strategies, and Patient/family education     [place], [place] 11/03/2021, 2:58 PM

## 2021-11-03 NOTE — Therapy (Signed)
[place]. [place], [name], [address] Phone: [phone]   Fax:  [phone]  Physical Therapy Treatment  Patient Details  Name: William [name] MRN: [id number] Date of Birth: 04-Jan-1941 Referring Provider (PT): Etter Sjogren   Encounter Date: 11/03/2021   PT End of Session - 11/03/21 1544     Visit Number 9    Date for PT Re-Evaluation 12/31/21    PT Start Time 6160    PT Stop Time 7371    PT Time Calculation (min) 43 min    Activity Tolerance Patient tolerated treatment well;No increased pain    Behavior During Therapy [facility] for tasks assessed/performed;Restless;Flat affect             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   Mild, related to stroke history   Nocturia    Renal insufficiency 06/25/2013   S/P carotid endarterectomy    BILATERAL ICA--  PATENT PER DUPLEX  05-19-2012   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   Right calf   Urinary frequency    Vitamin D deficiency     Past Surgical History:  Procedure Laterality Date   APPENDECTOMY  AS CHILD   CARDIOVASCULAR STRESS TEST  03-27-2012  DR CRENSHAW   LOW RISK LEXISCAN STUDY-- PROBABLE NORMAL PERFUSION AND SOFT TISSUE ATTENUATION/  NO ISCHEMIA/ EF 51%   CAROTID ENDARTERECTOMY Bilateral LEFT  11-12-2008  DR GREG HAYES   RIGHT ICA   2006  ([facility])   CYSTOSCOPY W/ RETROGRADES Bilateral 06/22/2021   Procedure: CYSTOSCOPY WITH RETROGRADE PYELOGRAM;  Surgeon: Franchot Gallo, MD;  Location: [facility];  Service: Urology;  Laterality: Bilateral;   CYSTOSCOPY WITH LITHOLAPAXY N/A 02/26/2013   Procedure: CYSTOSCOPY WITH LITHOLAPAXY;  Surgeon: Franchot Gallo, MD;  Location: [facility];  Service: Urology;  Laterality: N/A;   EYE SURGERY  Jan. 2016   cataract surgery both eyes   INGUINAL HERNIA REPAIR Right 11-08-2006   IR KYPHO EA ADDL LEVEL THORACIC OR LUMBAR  02/12/2021   IR RADIOLOGIST EVAL & MGMT  02/18/2021   MASS EXCISION N/A 03/03/2016   Procedure: EXCISION OF BACK  MASS;  Surgeon: Stark Klein, MD;  Location: [place];  Service: General;  Laterality: N/A;   MOHS SURGERY Left 1/ 2016   Dr Nevada Crane-- Basal cell   PROSTATE SURGERY     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-C N/A 06/22/2021   Procedure: TRANSURETHRAL RESECTION OF BLADDER TUMOR;  Surgeon: Franchot Gallo, MD;  Location: [facility];  Service: Urology;  Laterality: N/A;   TRANSURETHRAL RESECTION OF PROSTATE N/A 02/26/2013   Procedure: TRANSURETHRAL RESECTION OF THE PROSTATE WITH GYRUS INSTRUMENTS;  Surgeon: Franchot Gallo, MD;  Location: [facility];  Service: Urology;  Laterality: N/A;   TRANSURETHRAL RESECTION OF PROSTATE N/A 06/22/2021   Procedure: TRANSURETHRAL RESECTION OF THE PROSTATE (TURP);  Surgeon: Marcine Matar, MD;  Location: [facility];  Service: Urology;  Laterality: N/A;    There were no vitals filed for this visit.   Subjective Assessment - 11/03/21 1547     Subjective Wife reports that they went out to eat and he listed to the left really bad last Friday, no falls    Currently in Pain? No/denies                               [facility] Adult PT Treatment/Exercise - 11/03/21 0001       Ambulation/Gait   Gait  Comments walking without device, with 3# in left hand, cues for arm swing, large steps and posture      High Level Balance   High Level Balance Activities Negotiating over obstacles;Negotitating around obstacles    High Level Balance Comments standing beach ball batting back and then kicks, bending over reaching to touch cone right and left hand various positions and then cone toe touches, on airex balance and then on ariex back to wall having him tocuch the wall with his backside and then trying to correct without using his arms, side stepping over half bolster, side stepping in Pbars, side step over stick                       PT Short Term Goals - 10/01/21 1322       PT SHORT TERM GOAL #1   Title independent with initial HEP    Time 2    Period Weeks    Status New               PT Long Term Goals - 10/29/21 1611       PT LONG TERM GOAL #1   Title Understand fall risks, the importance of exercise and flexibility to health and function    Status On-going      PT LONG TERM GOAL #2   Title decrease TUG time to 13 seconds    Status Partially Met      PT LONG TERM GOAL #3   Title walk around our building without rest     Status On-going                   Plan - 11/03/21 1558     Clinical Impression Statement Patient really struggled with attention today, the gym was a little busier today, I took him into a room off the side and again very difficult with attention and safety.  Walked outside with him and he dod not fatigue like he used to but did need HHA.    PT Next Visit Plan continue to work on the function, gait, balance safety and the left LE    Consulted and Agree with Plan of Care Patient;Family member/caregiver    Family Member Consulted wife [name]             Patient will benefit from skilled therapeutic intervention in order to improve the following deficits and impairments:  Abnormal gait, Decreased coordination, Decreased range of  motion, Difficulty walking, Increased muscle spasms, Cardiopulmonary status limiting activity, Decreased endurance, Decreased activity tolerance, Pain, Impaired flexibility, Decreased balance, Decreased mobility, Decreased strength, Postural dysfunction  Visit Diagnosis: Repeated falls  Other abnormalities of gait and mobility  Other lack of coordination  Difficulty in walking, not elsewhere classified  Hemiplegia and hemiparesis following cerebral infarction affecting left non-dominant side (HCC)  Muscle weakness (generalized)     Problem List Patient Active Problem List   Diagnosis Date Noted   Thrush 10/08/2021   Hemiplegia, dominant side S/P CVA (cerebrovascular accident) ([place]) 09/11/2021   Insomnia    Prediabetes    Chronic kidney disease (CKD), stage IV (severe) ([place])    Basal ganglia infarction ([place]) 07/29/2021   Transaminitis 07/27/2021   UTI (urinary tract infection) 07/27/2021   CVA (cerebral vascular accident) ([place]) 07/27/2021   Fall at home, initial encounter 07/27/2021   Hyperglycemia 07/27/2021   CKD (chronic kidney disease), stage IV ([place]) 07/27/2021   Cholelithiasis 07/27/2021   Hypoxia 07/27/2021   Nausea and vomiting 85/07/7739   Acute metabolic encephalopathy 28/78/6767   Normocytic anemia 07/27/2021   Chronic back pain 07/27/2021   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[place]) 06/22/2021   Closed fracture of first lumbar vertebra with routine healing 02/03/2021   Closed fracture of multiple ribs 11/18/2020   Anxiety 01/29/2020   Leg pain, bilateral 01/29/2020   Ingrown toenail 07/13/2019   Lumbar spondylosis 05/02/2018   Pain in left knee 03/09/2018   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   Trochanteric bursitis of left hip 01/16/2018   Preventative health care 09/26/2017   HTN (hypertension) 07/19/2015   Hyperlipidemia 07/19/2015   Great toe pain 02/11/2014   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 [place], [name], [address] Phone: [phone]   Fax:  [phone]  Name: RAGHAV VERRILLI MRN: [id number] Date of Birth: 01-17-1941

## 2021-11-04 ENCOUNTER — Encounter: Payer: Self-pay | Admitting: Speech Pathology

## 2021-11-04 ENCOUNTER — Ambulatory Visit: Payer: PPO | Admitting: Speech Pathology

## 2021-11-04 DIAGNOSIS — I69354 Hemiplegia and hemiparesis following cerebral infarction affecting left non-dominant side: Secondary | ICD-10-CM | POA: Diagnosis not present

## 2021-11-04 DIAGNOSIS — R1310 Dysphagia, unspecified: Secondary | ICD-10-CM | POA: Diagnosis not present

## 2021-11-04 DIAGNOSIS — N184 Chronic kidney disease, stage 4 (severe): Secondary | ICD-10-CM | POA: Diagnosis not present

## 2021-11-04 DIAGNOSIS — R41841 Cognitive communication deficit: Secondary | ICD-10-CM

## 2021-11-04 DIAGNOSIS — I129 Hypertensive chronic kidney disease with stage 1 through stage 4 chronic kidney disease, or unspecified chronic kidney disease: Secondary | ICD-10-CM | POA: Diagnosis not present

## 2021-11-04 DIAGNOSIS — I69351 Hemiplegia and hemiparesis following cerebral infarction affecting right dominant side: Secondary | ICD-10-CM | POA: Diagnosis not present

## 2021-11-05 ENCOUNTER — Ambulatory Visit: Payer: PPO | Admitting: Occupational Therapy

## 2021-11-05 ENCOUNTER — Encounter: Payer: Self-pay | Admitting: Physical Therapy

## 2021-11-05 ENCOUNTER — Ambulatory Visit: Payer: PPO | Admitting: Physical Therapy

## 2021-11-05 ENCOUNTER — Ambulatory Visit: Payer: PPO | Admitting: Speech Pathology

## 2021-11-05 ENCOUNTER — Encounter: Payer: Self-pay | Admitting: Occupational Therapy

## 2021-11-05 DIAGNOSIS — R262 Difficulty in walking, not elsewhere classified: Secondary | ICD-10-CM

## 2021-11-05 DIAGNOSIS — I69354 Hemiplegia and hemiparesis following cerebral infarction affecting left non-dominant side: Secondary | ICD-10-CM | POA: Diagnosis not present

## 2021-11-05 DIAGNOSIS — R2689 Other abnormalities of gait and mobility: Secondary | ICD-10-CM

## 2021-11-05 DIAGNOSIS — R296 Repeated falls: Secondary | ICD-10-CM

## 2021-11-05 DIAGNOSIS — R278 Other lack of coordination: Secondary | ICD-10-CM

## 2021-11-05 DIAGNOSIS — M6281 Muscle weakness (generalized): Secondary | ICD-10-CM

## 2021-11-05 DIAGNOSIS — R41840 Attention and concentration deficit: Secondary | ICD-10-CM

## 2021-11-05 DIAGNOSIS — I69318 Other symptoms and signs involving cognitive functions following cerebral infarction: Secondary | ICD-10-CM

## 2021-11-05 NOTE — Therapy (Signed)
OUTPATIENT OCCUPATIONAL THERAPY TREATMENT NOTE   Patient Name: William [name] MRN: [id number] DOB:1941/03/07, 81 y.o., male Today's Date:   PCP: Roma Schanz, DO REFERRING PROVIDER: Carollee Herter, Alferd Apa, DO   OT End of Session -  1533     Visit Number 7    Number of Visits 13    Date for OT Re-Evaluation 12/14/21    Authorization Type [employer]    Authorization Time Period VL: [place]    Progress Note Due on Visit 10    OT Start Time 1530    OT Stop Time 1610    OT Time Calculation (min) 40 min    Activity Tolerance Patient tolerated treatment well   somewhat limited by activity tolerance   Behavior During Therapy [facility] for tasks assessed/performed;Restless;Flat affect            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   Mild, related to stroke history   Nocturia    Renal insufficiency 06/25/2013   S/P carotid endarterectomy    BILATERAL ICA--  PATENT PER DUPLEX  05-19-2012   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   Right calf   Urinary frequency    Vitamin D deficiency    Past Surgical History:  Procedure Laterality Date   APPENDECTOMY  AS CHILD   CARDIOVASCULAR STRESS TEST  03-27-2012  DR CRENSHAW   LOW RISK LEXISCAN STUDY-- PROBABLE NORMAL PERFUSION AND SOFT TISSUE ATTENUATION/  NO ISCHEMIA/ EF 51%   CAROTID  ENDARTERECTOMY Bilateral LEFT  11-12-2008  DR GREG HAYES   RIGHT ICA  2006  ([facility])   CYSTOSCOPY W/ RETROGRADES Bilateral 06/22/2021   Procedure: CYSTOSCOPY WITH RETROGRADE PYELOGRAM;  Surgeon: Franchot Gallo, MD;  Location: [facility];  Service: Urology;  Laterality: Bilateral;   CYSTOSCOPY WITH LITHOLAPAXY N/A 02/26/2013   Procedure: CYSTOSCOPY WITH LITHOLAPAXY;  Surgeon: Franchot Gallo, MD;  Location: [facility];  Service: Urology;  Laterality: N/A;   EYE SURGERY  Jan. 2016   cataract surgery both eyes   INGUINAL HERNIA REPAIR Right 11-08-2006   IR KYPHO EA ADDL LEVEL THORACIC OR LUMBAR  02/12/2021   IR RADIOLOGIST EVAL & MGMT  02/18/2021   MASS EXCISION N/A 03/03/2016   Procedure: EXCISION OF BACK  MASS;  Surgeon: Stark Klein, MD;  Location: [place];  Service: General;  Laterality: N/A;   MOHS SURGERY Left 1/ 2016   Dr Nevada Crane-- Basal cell   PROSTATE SURGERY     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-C N/A 06/22/2021   Procedure: TRANSURETHRAL RESECTION OF BLADDER TUMOR;  Surgeon: Franchot Gallo, MD;  Location: [facility];  Service: Urology;  Laterality: N/A;   TRANSURETHRAL RESECTION OF PROSTATE N/A 02/26/2013   Procedure: TRANSURETHRAL RESECTION OF THE  PROSTATE WITH GYRUS INSTRUMENTS;  Surgeon: Franchot Gallo, MD;  [facility];  Service: Urology;  Laterality: N/A;   TRANSURETHRAL RESECTION OF PROSTATE N/A 06/22/2021   Procedure: TRANSURETHRAL RESECTION OF THE PROSTATE (TURP);  Surgeon: Franchot Gallo, MD;  Location: [facility];  Service: Urology;  Laterality: N/A;   Patient Active Problem List   Diagnosis Date Noted   Thrush 10/08/2021   Hemiplegia, dominant side S/P CVA (cerebrovascular accident) ([place]) 09/11/2021   Insomnia    Prediabetes    Chronic kidney disease (CKD), stage IV (severe) (HCC)    Basal ganglia infarction ([place]) 07/29/2021    Transaminitis 07/27/2021   UTI (urinary tract infection) 07/27/2021   CVA (cerebral vascular accident) ([place]) 07/27/2021   Fall at home, initial encounter 07/27/2021   Hyperglycemia 07/27/2021   CKD (chronic kidney disease), stage IV ([place]) 07/27/2021   Cholelithiasis 07/27/2021   Hypoxia 07/27/2021   Nausea and vomiting 71/69/6789   Acute metabolic encephalopathy 38/03/1750   Normocytic anemia 07/27/2021   Chronic back pain 07/27/2021   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[place]) 06/22/2021   Closed fracture of first lumbar vertebra with routine healing 02/03/2021   Closed fracture of multiple ribs 11/18/2020   Anxiety 01/29/2020   Leg pain, bilateral 01/29/2020   Ingrown toenail 07/13/2019   Lumbar spondylosis 05/02/2018   Pain in left knee 03/09/2018   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   Trochanteric bursitis of left hip 01/16/2018   Preventative health care 09/26/2017   HTN (hypertension) 07/19/2015   Hyperlipidemia 07/19/2015   Great toe pain 02/11/2014   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   Obesity (BMI 30-39.9) 06/25/2013   Renal insufficiency 06/25/2013   Weakness of left arm 06/25/2013   Sebaceous cyst 03/03/2011   Sprain of lumbar region 07/31/2010   Rib pain, left 08/29/2009   Carotid artery stenosis, asymptomatic, bilateral 05/02/2009   Eczema, atopic 05/31/2008   Vitamin D deficiency 03/01/2008   BPH (benign prostatic hyperplasia) 08/06/2007   Fasting hyperglycemia 12/21/2006   History of right MCA infarct 2006    ONSET DATE: 07/27/21  REFERRING DIAG: R41.4 (ICD-10-CM) - Hemi-inattention I63.9 (ICD-10-CM) - Cerebrovascular accident (CVA), unspecified mechanism ([place])     THERAPY DIAG:  Attention and concentration deficit  Other abnormalities of gait and mobility  Other lack of coordination  Other symptoms and signs involving cognitive functions following cerebral infarction  Repeated falls   PERTINENT HISTORY: L  basal ganglia and corona radiata infarct 07/27/21 (had documented AMS for about 5 days prior w/ hospital work-up including MRI negative and symptoms attributed to a UTI); L lower homonymous quadrantanopia. Other PMH includes R MCA infarct in 2006 w/ L-sided hemiparesis, NCD, carotid artery disease (bilateral), HTN, HLD,CKD3, and hx of bladder cancer  PRECAUTIONS: Fall risk  SUBJECTIVE:   SUBJECTIVE ASSESSMENT: Pt's wife reports she is unable to reposition pt to sit upright in the car  PAIN: Are you having pain? No   OBJECTIVE:   TODAY'S TREATMENT: Practiced car transfers in pt's SUV, problem-solving to identify most effective cues w/ pt's preferred method (LEs first, then pulling up to sit), but pt was unable to reposition hips to the left w/ verbal, tactile, gestural cues, extended time, and various adjustments of UE supports. OT then demonstrated transfer for pt prior to additional attempts, but he remained unsuccessful. Pt also highly distractible during activity, requiring frequent redirection. Functional transitions w/ pt sitting EOM, practicing "scooting" toward L and R side; pt experienced continued difficulty  w/ verbal/visual/gestural cues so OT incorporated therapy cone as a visual aid, emphasizing "big scoot" toward cones w/ each attempt. Success improved w/ repetition and verbal cues. OT also provided relevant education on transfer aids, including slideboard, and practiced scooting to L and R side w/ slideboard. Pt ambulated in/out of session and from treatment space <> parked car w/ CGA and mod cues for increased awareness of L side, amplitude of steps (particularly on L side), and simulated metronome for pacing.   PATIENT EDUCATION: Continued condition-specific education, particularly related to potentially beneficial cues during functional transitions/transfers and options for DME/AE prn; see above Person educated: Patient and Spouse Education method: Holiday representative Education comprehension: verbalized understanding   GOALS: Goals reviewed with patient? Yes   SHORT TERM GOALS: Target date:    STG   Status:  1 Pt/caregiver will verbalize understanding of visual perceptual compensatory strategies Baseline: Decreased knowledge of visual deficits and strategies On-going  2 Pt will complete symbol cancellation task w/ at least 50% accuracy using visual compensatory strategies to indicate improved attention Baseline: 40% accuracy w/ multiple errors On-going  3 Pt will participate in bilateral coordination activity w/ no more than 3 cues for incorporation of LUE Baseline: L-sided inattention On-going      LONG TERM GOALS: Target date: 11/26/2021   LTG   Status:  1 Pt will be able to thread LUE into shirt sleeve w/ SPV in at least 1/2 trials Baseline: Min A to don/doff shirt sleeve on L side On-going  2 Pt will be able to thread bottoms on BLEs w/ Mod A Baseline: Max A for LB dressing On-going  3 Pt will demonstrate supine-to-sit transition w/ cues from caregiver and incorporation of AE/DME prn Baseline: Mod-Max A w/ bed mobility On-going  4 Pt will be able to use TV remote (or adaptive remote) to complete 1 task (e.g., turn TV on/of, turn volume up/down) w/ SPV Baseline: Difficulty  On-going  5 Pt will increase Barthel Index score by at least 5 to indicate improved level of participation in BADLs Baseline: 60/100 On-going      ASSESSMENT:   CLINICAL IMPRESSION: Considering pt's wife's report of persistent difficulty w/ car transfers and bed mobility, OT practiced transfer in pt's car due to EBP supporting task-oriented training w/ physical context and parameters being as close to the pt's real environment as possible for best outcomes. Limitations w/ sustained and selective attention impacted success as well as difficulty w/ ideation and motor planning. Pt also demonstrated difficulty w/ body schema, consistently pushing  shoulders posteriorly and to the left when cued to push hips back and to the left. Pt will also likely benefit from transfer aid allowing for easier sliding w/ functional transitions and transfers w/ OT discussing both slide sheet and slideboard w/ pt's wife; will continue to focus on core strengthening, L-sided awareness, and perception to improve bed mobility and car transfers for decreased caregiver strain.   PERFORMANCE DEFICITS in functional skills including ADLs, IADLs, coordination, dexterity, [place], GMC, mobility, balance, and vision, cognitive skills including attention, memory, orientation, problem solving, safety awareness, sequencing, and temperament/personality, and psychosocial skills including environmental adaptation, interpersonal interactions, and routines and behaviors.    IMPAIRMENTS are limiting patient from ADLs, IADLs, rest and sleep, leisure, and social participation.    COMORBIDITIES may have co-morbidities  that affects occupational performance. Patient will benefit from skilled OT to address above impairments and improve overall function.     PLAN: OT FREQUENCY: 2x/week   OT  DURATION: 6 weeks   PLANNED INTERVENTIONS: self care/ADL training, therapeutic exercise, therapeutic activity, neuromuscular re-education, balance training, functional mobility training, aquatic therapy, splinting, moist heat, cryotherapy, patient/family education, cognitive remediation/compensation, visual/perceptual remediation/compensation, psychosocial skills training, energy conservation, and DME and/or AE instructions   RECOMMENDED OTHER SERVICES: Currently receiving PT/ST services at this location   CONSULTED AND AGREED WITH PLAN OF CARE: Patient and family member/caregiver   PLAN FOR NEXT SESSION: Large amplitude movements (trunk rotation, cross body reaching, trunk extension); continue to address functional transitions/transfers (elevated HOB; bed ladder; bed rail), dressing, and retropulsion  and L-sided attention/bilateral tasks   Kathrine Cords, MSOT, OTR/L , 4:45 PM

## 2021-11-05 NOTE — Therapy (Signed)
[address] [place], [name], [address] Phone: [phone]   Fax:  [phone] Progress Note Reporting Period 10/01/21 to 10/26/2021  See note below for Objective Data and Assessment of Progress/Goals.     Physical Therapy Treatment  Patient Details  Name: William [name] MRN: [id number] Date of Birth: [birth year] Referring Provider (PT): Etter Sjogren   Encounter Date: 11/06/2021   PT End of Session - 11/04/2021 1617     Visit Number 10    Date for PT Re-Evaluation 12/31/21    PT Start Time 8938    PT Stop Time 1700    PT Time Calculation (min) 46 min    Activity Tolerance Patient tolerated treatment well;No increased pain    Behavior During Therapy [facility] for tasks assessed/performed;Restless;Flat affect             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   Mild, related to stroke history   Nocturia    Renal insufficiency 06/25/2013   S/P carotid endarterectomy    BILATERAL ICA--  PATENT PER DUPLEX  05-19-2012   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   Right calf   Urinary frequency    Vitamin D deficiency     Past Surgical History:  Procedure Laterality Date   APPENDECTOMY  AS CHILD   CARDIOVASCULAR STRESS TEST  03-27-2012  DR CRENSHAW   LOW RISK LEXISCAN STUDY-- PROBABLE NORMAL PERFUSION AND  SOFT TISSUE ATTENUATION/  NO ISCHEMIA/ EF 51%   CAROTID ENDARTERECTOMY Bilateral LEFT  11-12-2008  DR GREG HAYES   RIGHT ICA  2006  ([facility])   CYSTOSCOPY W/ RETROGRADES Bilateral 06/22/2021   Procedure: CYSTOSCOPY WITH RETROGRADE PYELOGRAM;  Surgeon: Franchot Gallo, MD;  Location: [facility];  Service: Urology;  Laterality: Bilateral;   CYSTOSCOPY WITH LITHOLAPAXY N/A 02/26/2013   Procedure: CYSTOSCOPY WITH LITHOLAPAXY;  Surgeon: Franchot Gallo, MD;  Location: [facility];  Service: Urology;  Laterality: N/A;   EYE SURGERY  Jan. 2016   cataract surgery both eyes   INGUINAL HERNIA REPAIR Right 11-08-2006   IR KYPHO EA ADDL LEVEL THORACIC OR LUMBAR  02/12/2021   IR RADIOLOGIST EVAL & MGMT  02/18/2021   MASS EXCISION N/A 03/03/2016   Procedure: EXCISION OF BACK  MASS;  Surgeon: Stark Klein, MD;  Location: [place];  Service: General;  Laterality: N/A;   MOHS SURGERY Left 1/ 2016   Dr Nevada Crane-- Basal cell   PROSTATE SURGERY     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-C N/A 06/22/2021   Procedure: TRANSURETHRAL RESECTION OF BLADDER TUMOR;  Surgeon: Franchot Gallo, MD;  Location: [facility];  Service: Urology;  Laterality: N/A;   TRANSURETHRAL RESECTION OF PROSTATE N/A 02/26/2013   Procedure: TRANSURETHRAL  RESECTION OF THE PROSTATE WITH GYRUS INSTRUMENTS;  Surgeon: Franchot Gallo, MD;  Location: [facility];  Service: Urology;  Laterality: N/A;   TRANSURETHRAL RESECTION OF PROSTATE N/A 06/22/2021   Procedure: TRANSURETHRAL RESECTION OF THE PROSTATE (TURP);  Surgeon: Franchot Gallo, MD;  Location: [facility];  Service: Urology;  Laterality: N/A;    There were no vitals filed for this visit.   Subjective Assessment - 10/22/2021 1618     Subjective Wife reports that her back is hurting becuase he really cannot help to get up out of bed and or in and out of the car    Currently in  Pain? No/denies                               [facility] Adult PT Treatment/Exercise - 10/13/2021 0001       Transfers   Comments worked with patient and his wife on bed mobility and from supine to sit and sit to supine, needs a lot of cues to initiate the motions and movements, and a lot of education to all the patient to do more and then education to protect wifes back      Ambulation/Gait   Gait Comments gait outside to car from the back door, no rest but a lot of cues needed. then really had trouble wit getting straight in the car seat, was slouched to the left      High Level Balance   High Level Balance Comments standing 6" toe touches      Lumbar Exercises: Aerobic   Nustep lvl 5 x6 mins      Lumbar Exercises: Seated   Other Seated Lumbar Exercises side to side touching elbows for core work, Marketing executive bats working on sitting balance      Lumbar Exercises: Supine   Other Supine Lumbar Exercises partial sit ups, isometric abs, trunk side to side and bridges                       PT Short Term Goals - 10/01/21 1322       PT SHORT TERM GOAL #1   Title independent with initial HEP    Time 2    Period Weeks    Status New               PT Long Term Goals - 10/28/2021 1618       PT LONG TERM GOAL #1   Title Understand fall risks, the importance of exercise and flexibility to health and function    Status On-going      PT LONG TERM GOAL #2   Title decrease TUG time to 13 seconds    Status Partially Met      PT LONG TERM GOAL #3   Title walk around our building without rest     Status On-going      PT LONG TERM GOAL #4   Title increase LE strength to 4+/5    Status On-going                   Plan - 10/25/2021 1658     Clinical Impression Statement Attention is a big issue, it was quiter in the gym today and he was able to work better until he saw the TV, then he started having attention issues again.  The wife tends to do  more of the transfer rgiht now  and we worked to try to help with this and allow the patient to do more, but again attention is big and the ability to initiate the move or transfer was lacking more today.  Had more flex in his knees and really struggled to get in the car straight, able to get in but was slouched to the left and could not get his bottom into the seat    PT Next Visit Plan continue to work on the function, gait, balance safety and the left LE    Consulted and Agree with Plan of Care Patient;Family member/caregiver             Patient will benefit from skilled therapeutic intervention in order to improve the following deficits and impairments:  Abnormal gait, Decreased coordination, Decreased range of motion, Difficulty walking, Increased muscle spasms, Cardiopulmonary status limiting activity, Decreased endurance, Decreased activity tolerance, Pain, Impaired flexibility, Decreased balance, Decreased mobility, Decreased strength, Postural dysfunction  Visit Diagnosis: Repeated falls  Other abnormalities of gait and mobility  Other lack of coordination  Difficulty in walking, not elsewhere classified  Hemiplegia and hemiparesis following cerebral infarction affecting left non-dominant side (HCC)  Muscle weakness (generalized)     Problem List Patient Active Problem List   Diagnosis Date Noted   Thrush 10/08/2021   Hemiplegia, dominant side S/P CVA (cerebrovascular accident) ([place]) 09/11/2021   Insomnia    Prediabetes    Chronic kidney disease (CKD), stage IV (severe) (HCC)    Basal ganglia infarction ([place]) 07/29/2021   Transaminitis 07/27/2021   UTI (urinary tract infection) 07/27/2021   CVA (cerebral vascular accident) ([place]) 07/27/2021   Fall at home, initial encounter 07/27/2021   Hyperglycemia 07/27/2021   CKD (chronic kidney disease), stage IV ([place]) 07/27/2021   Cholelithiasis 07/27/2021   Hypoxia 07/27/2021   Nausea and vomiting 16/03/9603   Acute  metabolic encephalopathy 54/02/8118   Normocytic anemia 07/27/2021   Chronic back pain 07/27/2021   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[place]) 06/22/2021   Closed fracture of first lumbar vertebra with routine healing 02/03/2021   Closed fracture of multiple ribs 11/18/2020   Anxiety 01/29/2020   Leg pain, bilateral 01/29/2020   Ingrown toenail 07/13/2019   Lumbar spondylosis 05/02/2018   Pain in left knee 03/09/2018   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   Trochanteric bursitis of left hip 01/16/2018   Preventative health care 09/26/2017   HTN (hypertension) 07/19/2015   Hyperlipidemia 07/19/2015   Great toe pain 02/11/2014   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 [place], [name], [address] Phone: [phone]   Fax:  [phone]  Name: William [name] MRN: [id number] Date of Birth: 1941/04/12

## 2021-11-05 NOTE — Therapy (Signed)
OUTPATIENT SPEECH LANGUAGE PATHOLOGY TREATMENT NOTE   Patient Name: William [name] MRN: [id number] DOB:1940-10-31, 81 y.o., male Today's Date: 11/01/2021  PCP: Donato Schultz, DO  REFERRING PROVIDER: [name], [name], DO   END OF SESSION:   End of Session - 11/04/21 1536     Visit Number 10    Number of Visits 17    Date for SLP Re-Evaluation 12/08/21    SLP Start Time 1531    SLP Stop Time  1611    SLP Time Calculation (min) 40 min    Activity Tolerance Patient tolerated treatment well;No increased pain             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   Mild, related to stroke history   Nocturia    Renal insufficiency 06/25/2013   S/P carotid endarterectomy    BILATERAL ICA--  PATENT PER DUPLEX  05-19-2012   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   Right calf   Urinary frequency    Vitamin D deficiency    Past Surgical History:  Procedure Laterality Date   APPENDECTOMY  AS CHILD   CARDIOVASCULAR STRESS TEST  03-27-2012  DR CRENSHAW   LOW RISK LEXISCAN STUDY-- PROBABLE NORMAL PERFUSION AND SOFT TISSUE ATTENUATION/  NO ISCHEMIA/ EF 51%   CAROTID ENDARTERECTOMY Bilateral LEFT  11-12-2008  DR GREG HAYES   RIGHT ICA  2006  ([facility])   CYSTOSCOPY W/ RETROGRADES Bilateral 06/22/2021   Procedure: CYSTOSCOPY WITH RETROGRADE PYELOGRAM;  Surgeon:  Marcine Matar, MD;  Location: [facility];  Service: Urology;  Laterality: Bilateral;   CYSTOSCOPY WITH LITHOLAPAXY N/A 02/26/2013   Procedure: CYSTOSCOPY WITH LITHOLAPAXY;  Surgeon: Marcine Matar, MD;  Location: [facility];  Service: Urology;  Laterality: N/A;   EYE SURGERY  Jan. 2016   cataract surgery both eyes   INGUINAL HERNIA REPAIR Right 11-08-2006   IR KYPHO EA ADDL LEVEL THORACIC OR LUMBAR  02/12/2021   IR RADIOLOGIST EVAL & MGMT  02/18/2021   MASS EXCISION N/A 03/03/2016   Procedure: EXCISION OF BACK  MASS;  Surgeon: Almond Lint, MD;  Location: [facility];  Service: General;  Laterality: N/A;   MOHS SURGERY Left 1/ 2016   Dr Margo Aye-- Basal cell   PROSTATE SURGERY     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-C N/A 06/22/2021   Procedure: TRANSURETHRAL RESECTION OF BLADDER TUMOR;  Surgeon: Marcine Matar, MD;  Location: [facility];  Service: Urology;  Laterality: N/A;   TRANSURETHRAL RESECTION OF PROSTATE N/A 02/26/2013   Procedure: TRANSURETHRAL RESECTION OF THE PROSTATE WITH GYRUS INSTRUMENTS;  Surgeon: Marcine Matar, MD;  Location: [facility];  Service: Urology;  Laterality: N/A;   TRANSURETHRAL RESECTION OF PROSTATE N/A 06/22/2021  Procedure: TRANSURETHRAL RESECTION OF THE PROSTATE (TURP);  Surgeon: Franchot Gallo, MD;  Location: [facility];  Service: Urology;  Laterality: N/A;   Patient Active Problem List   Diagnosis Date Noted   Thrush 10/08/2021   Hemiplegia, dominant side S/P CVA (cerebrovascular accident) ([place]) 09/11/2021   Insomnia    Prediabetes    Chronic kidney disease (CKD), stage IV (severe) (HCC)    Basal ganglia infarction ([place]) 07/29/2021   Transaminitis 07/27/2021   UTI (urinary tract infection) 07/27/2021   CVA (cerebral vascular accident) ([place]) 07/27/2021   Fall at home, initial encounter 07/27/2021   Hyperglycemia 07/27/2021   CKD  (chronic kidney disease), stage IV ([place]) 07/27/2021   Cholelithiasis 07/27/2021   Hypoxia 07/27/2021   Nausea and vomiting 67/59/1638   Acute metabolic encephalopathy 46/65/9935   Normocytic anemia 07/27/2021   Chronic back pain 07/27/2021   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[place]) 06/22/2021   Closed fracture of first lumbar vertebra with routine healing 02/03/2021   Closed fracture of multiple ribs 11/18/2020   Anxiety 01/29/2020   Leg pain, bilateral 01/29/2020   Ingrown toenail 07/13/2019   Lumbar spondylosis 05/02/2018   Pain in left knee 03/09/2018   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   Trochanteric bursitis of left hip 01/16/2018   Preventative health care 09/26/2017   HTN (hypertension) 07/19/2015   Hyperlipidemia 07/19/2015   Great toe pain 02/11/2014   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   Obesity (BMI 30-39.9) 06/25/2013   Renal insufficiency 06/25/2013   Weakness of left arm 06/25/2013   Sebaceous cyst 03/03/2011   Sprain of lumbar region 07/31/2010   Rib pain, left 08/29/2009   Carotid artery stenosis, asymptomatic, bilateral 05/02/2009   Eczema, atopic 05/31/2008   Vitamin D deficiency 03/01/2008   BPH (benign prostatic hyperplasia) 08/06/2007   Fasting hyperglycemia 12/21/2006   History of right MCA infarct 2006    ONSET DATE: 07/27/21  REFERRING DIAG: I63.9 (ICD-10-CM) - Cerebrovascular accident (CVA), unspecified mechanism ([place])   THERAPY DIAG:  Cognitive communication deficit  SUBJECTIVE: "I'm good today, I'm good today."  PAIN:  Are you having pain? No     OBJECTIVE:   TODAY'S TREATMENT: Pt was seen for skilled ST services with focus on cognition. Completed cognitive task to facilitate increased attention. SLP edu on the importance of brain breaks when attention is beginning to falter and pt exhibits increased confusion. During 30 min of work, pt required 4, 3 minute brain breaks. Pt continues to require verbal  cues for redirection and to "slow down", as pt's perseverative nature impacts his ability to complete tasks efficiently. Will continue with current POC.   PATIENT EDUCATION: Education details: Cognitive-communication impairment Person educated: Patient and Spouse Education method: Customer service manager Education comprehension: verbalized understanding, verbal cues required, and needs further education       GOALS: Goals reviewed with patient? Yes   SHORT TERM GOALS: Target date: 10/28/2021   Patient will demonstrate orientation x4 with modA verbal cues to refer to compensatory aids. Baseline: Goal status: MET   2.  Patient/wife will recall safe swallow strategies to use at home to increase safety with consuming food/drink PO with minA.  Baseline:  Goal status: INITIAL   3.  Pt will comprehend 1-step directions with 80% accuracy given less than 2 verbal cues. Baseline:  Goal status: INITIAL   4.   Pt will attend to cognitive-communication task for 5 minutes given less than 2 verbal cues to increase safety. Baseline:  Goal status: INITIAL       LONG TERM GOALS: Target date: 12/03/2021   Wife will report successful use of communication strategies at home to decrease communication breakdowns independently. Baseline:  Goal status: INITIAL   2.  Pt will demonstrate orientation x4 with supA verbal cues.  Baseline:  Goal status: INITIAL   3.  Pt will follow simple, 2-step directions with 80% accuracy given less than 2 verbal cues. Baseline:  Goal status: INITIAL   4.  Pt will attend to cognitive-communication task for 10 minutes given less than 2 verbal cues to increase safety. Baseline:  Goal status: INITIAL     ASSESSMENT:   CLINICAL IMPRESSION: See tx note.  SLP rec skilled ST services to address cog-comm impairment to maximize functional communication and decrease caregiver burden. Continue with current POC.     OBJECTIVE IMPAIRMENTS include attention,  memory, awareness, executive functioning, dysarthria, and dysphagia. These impairments are limiting patient from ADLs/IADLs, effectively communicating at home and in community, and safety when swallowing. Factors affecting potential to achieve goals and functional outcome are ability to learn/carryover information and severity of impairments.. Patient will benefit from skilled SLP services to address above impairments and improve overall function.   REHAB POTENTIAL: Good   PLAN: SLP FREQUENCY: 2x/week   SLP DURATION: 8 weeks   PLANNED INTERVENTIONS: Aspiration precaution training, Environmental controls, Cueing hierachy, Cognitive reorganization, Internal/external aids, Functional tasks, SLP instruction and feedback, Compensatory strategies, and Patient/family education   Speech Therapy Progress Note  Dates of Reporting Period: 10/07/21 to present  Subjective Statement: Pt and wife report success at home utilizing strategies learned.  Objective: See tx notes.  Goal Update: See tx notes. Making progress towards goals.   Plan: Cont with current POC  Reason Skilled Services are Required: SLP rec skilled ST services to address communication and dysphagia  to improve communication and decrease caregiver burden.    Verdene Lennert, [place] 10/23/2021, 3:10 PM

## 2021-11-07 ENCOUNTER — Ambulatory Visit (INDEPENDENT_AMBULATORY_CARE_PROVIDER_SITE_OTHER): Payer: PPO

## 2021-11-07 ENCOUNTER — Ambulatory Visit (HOSPITAL_COMMUNITY)
Admission: EM | Admit: 2021-11-07 | Discharge: 2021-11-07 | Disposition: A | Discharge status: Home / Self Care | Payer: PPO | Attending: Emergency Medicine | Admitting: Emergency Medicine

## 2021-11-07 ENCOUNTER — Encounter (HOSPITAL_COMMUNITY): Payer: Self-pay

## 2021-11-07 DIAGNOSIS — M25512 Pain in left shoulder: Secondary | ICD-10-CM | POA: Diagnosis not present

## 2021-11-07 DIAGNOSIS — S4992XA Unspecified injury of left shoulder and upper arm, initial encounter: Secondary | ICD-10-CM | POA: Diagnosis not present

## 2021-11-07 NOTE — ED Triage Notes (Signed)
Pt presents with left shoulder injury after a fall this afternoon.

## 2021-11-07 NOTE — Discharge Instructions (Signed)
X-rays negative for fracture  Symptoms should get better with time, pain is at its most prominent today as a fall has just occurred  He had a

## 2021-11-07 NOTE — ED Provider Notes (Signed)
[place]    CSN: [id number] Arrival date & time: 11/07/21  1647      History   Chief Complaint Chief Complaint  Patient presents with   Fall   Shoulder Injury    HPI William [name] is a 81 y.o. male.   Patient presents with left-sided shoulder pain after fall.  Endorses he was walking and got tripped up falling on his buttocks and left side.  Pain is worsened with abduction of arm, feels better when directly next to side.  Has given Tylenol which has been minimally helpful.  Denies numbness, tingling, syncope, lightheadedness.  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   Mild, related to stroke history   Nocturia    Renal insufficiency 06/25/2013   S/P carotid endarterectomy    BILATERAL ICA--  PATENT PER DUPLEX  05-19-2012   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   Right calf   Urinary frequency    Vitamin D deficiency     Patient Active Problem List   Diagnosis Date Noted   Thrush 10/08/2021   Hemiplegia, dominant side S/P CVA (cerebrovascular accident) ([place]) 09/11/2021   Insomnia    Prediabetes    Chronic kidney disease (CKD), stage IV (severe) (HCC)    Basal ganglia infarction ([place]) 07/29/2021   Transaminitis 07/27/2021   UTI (urinary tract infection) 07/27/2021   CVA (cerebral vascular  accident) ([place]) 07/27/2021   Fall at home, initial encounter 07/27/2021   Hyperglycemia 07/27/2021   CKD (chronic kidney disease), stage IV ([place]) 07/27/2021   Cholelithiasis 07/27/2021   Hypoxia 07/27/2021   Nausea and vomiting 24/23/5361   Acute metabolic encephalopathy 44/31/5400   Normocytic anemia 07/27/2021   Chronic back pain 07/27/2021   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[place]) 06/22/2021   Closed fracture of first lumbar vertebra with routine healing 02/03/2021   Closed fracture of multiple ribs 11/18/2020   Anxiety 01/29/2020   Leg pain, bilateral 01/29/2020   Ingrown toenail 07/13/2019   Lumbar spondylosis 05/02/2018   Pain in left knee 03/09/2018   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   Trochanteric bursitis of left hip 01/16/2018   Preventative health care 09/26/2017   HTN (hypertension) 07/19/2015   Hyperlipidemia 07/19/2015   Great toe pain 02/11/2014   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   Obesity (BMI 30-39.9) 06/25/2013   Renal insufficiency 06/25/2013   Weakness of left arm 06/25/2013   Sebaceous cyst 03/03/2011   Sprain of lumbar region 07/31/2010   Rib pain, left 08/29/2009   Carotid artery stenosis, asymptomatic, bilateral 05/02/2009   Eczema, atopic 05/31/2008   Vitamin D deficiency 03/01/2008   BPH (benign prostatic hyperplasia) 08/06/2007   Fasting hyperglycemia 12/21/2006   History of right  MCA infarct 2006    Past Surgical History:  Procedure Laterality Date   APPENDECTOMY  AS CHILD   CARDIOVASCULAR STRESS TEST  03-27-2012  DR CRENSHAW   LOW RISK LEXISCAN STUDY-- PROBABLE NORMAL PERFUSION AND SOFT TISSUE ATTENUATION/  NO ISCHEMIA/ EF 51%   CAROTID ENDARTERECTOMY Bilateral LEFT  11-12-2008  DR GREG HAYES   RIGHT ICA  2006  ([facility])   CYSTOSCOPY W/ RETROGRADES Bilateral 06/22/2021   Procedure: CYSTOSCOPY WITH RETROGRADE PYELOGRAM;  Surgeon: Franchot Gallo, MD;  Location: [facility];  Service:  Urology;  Laterality: Bilateral;   CYSTOSCOPY WITH LITHOLAPAXY N/A 02/26/2013   Procedure: CYSTOSCOPY WITH LITHOLAPAXY;  Surgeon: Franchot Gallo, MD;  Location: [facility];  Service: Urology;  Laterality: N/A;   EYE SURGERY  Jan. 2016   cataract surgery both eyes   INGUINAL HERNIA REPAIR Right 11-08-2006   IR KYPHO EA ADDL LEVEL THORACIC OR LUMBAR  02/12/2021   IR RADIOLOGIST EVAL & MGMT  02/18/2021   MASS EXCISION N/A 03/03/2016   Procedure: EXCISION OF BACK  MASS;  Surgeon: Stark Klein, MD;  Location: [facility];  Service: General;  Laterality: N/A;   MOHS SURGERY Left 1/ 2016   Dr Nevada Crane-- Basal cell   PROSTATE SURGERY     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-C N/A 06/22/2021   Procedure: TRANSURETHRAL RESECTION OF BLADDER TUMOR;  Surgeon: Franchot Gallo, MD;  Location: [facility];  Service: Urology;  Laterality: N/A;   TRANSURETHRAL RESECTION OF PROSTATE N/A 02/26/2013   Procedure: TRANSURETHRAL RESECTION OF THE PROSTATE WITH GYRUS INSTRUMENTS;  Surgeon: Franchot Gallo, MD;  Location: [facility];  Service: Urology;  Laterality: N/A;   TRANSURETHRAL RESECTION OF PROSTATE N/A 06/22/2021   Procedure: TRANSURETHRAL RESECTION OF THE PROSTATE (TURP);  Surgeon: Franchot Gallo, MD;  Location: [facility];  Service: Urology;  Laterality: N/A;       Home Medications    Prior to Admission medications   Medication Sig Start Date End Date Taking? Authorizing Provider  acetaminophen (TYLENOL) 325 MG tablet Take 1-2 tablets (325-650 mg total) by mouth every 4 (four) hours as needed for mild pain. 08/17/21   Setzer, Edman Circle, PA-C  amantadine (SYMMETREL) 100 MG capsule Take 1 capsule (100 mg total) by mouth daily. 10/13/21   Roma Schanz R, DO  amLODipine (NORVASC) 10 MG tablet Take 1 tablet (10 mg total) by mouth every morning. 08/18/21   Setzer, Edman Circle, PA-C  aspirin 81 MG EC tablet Take 1  tablet (81 mg total) by mouth daily. Swallow whole. 08/18/21   Setzer, Edman Circle, PA-C  carBAMazepine (TEGRETOL) 100 MG/5ML suspension Take 5 mLs (100 mg total) by mouth 3 (three) times daily. 10/05/21   Raulkar, Clide Deutscher, MD  Cholecalciferol (VITAMIN D) 50 MCG (2000 UT) CAPS Take 2,000 Units by mouth every morning.    [provider]  ciprofloxacin (CIPRO) 500 MG tablet Take 1 tablet (500 mg total) by mouth every 12 (twelve) hours. 10/02/21   Sherwood Gambler, MD  clopidogrel (PLAVIX) 75 MG tablet Take 1 tablet (75 mg total) by mouth daily. 10/13/21   Ann Held, DO  docusate sodium (COLACE) 100 MG capsule Take 1 capsule (100 mg total) by mouth every 12 (twelve) hours. 10/02/21   Sherwood Gambler, MD  fenofibrate 160 MG tablet Take 1 tablet (160 mg total) by mouth at bedtime. 08/17/21   Setzer, Edman Circle, PA-C  Flaxseed, Linseed, (FLAXSEED OIL) 1200  MG CAPS Take 1,200 mg by mouth 2 (two) times daily.    [provider]  fluticasone (CUTIVATE) 1.76 % cream 1 application daily as needed (psoriasis). 12/16/16   [provider]  hydrALAZINE (APRESOLINE) 25 MG tablet Take 1 tablet (25 mg total) by mouth every 8 (eight) hours. 10/13/21   Roma Schanz R, DO  lidocaine (LIDODERM) 5 % Place 1 patch onto the skin daily. Remove & Discard patch within 12 hours or as directed by MD 08/18/21   Vaughan Basta, Edman Circle, PA-C  LORazepam (ATIVAN) 0.5 MG tablet Take 1/2 tablet (0.25 mg total) by mouth daily. 08/18/21   Setzer, Edman Circle, PA-C  LORazepam (ATIVAN) 0.5 MG tablet Take 1/2 tablet by mouth every morning and take 1 tablet by mouth at bedtime 09/02/21   Plovsky, Berneta Sages, MD  LORazepam (ATIVAN) 1 MG tablet Take 1 tablet (1 mg total) by mouth at bedtime. 09/15/21   Ann Held, DO  metroNIDAZOLE (FLAGYL) 500 MG tablet Take 1 tablet (500 mg total) by mouth 2 (two) times daily. One po bid x 7 days 10/02/21   Sherwood Gambler, MD  Multiple Vitamins-Minerals (ADULT ONE DAILY GUMMIES) CHEW Chew 1  tablet by mouth every morning.    [provider]  NONFORMULARY OR COMPOUNDED ITEM Lift chair   dx s/p cva 09/08/21   Carollee Herter, Alferd Apa, DO  nystatin (MYCOSTATIN) 100000 UNIT/ML suspension Take 5 mLs (500,000 Units total) by mouth 4 (four) times daily. 10/21/21   Ann Held, DO  pantoprazole (PROTONIX) 40 MG tablet Take 1 tablet (40 mg total) by mouth daily. 09/15/21   Ann Held, DO  pantoprazole sodium (PROTONIX) 40 mg Place 40 mg into feeding tube daily. 09/15/21   Ann Held, DO  polyethylene glycol (MIRALAX / GLYCOLAX) 17 g packet Take 17 g by mouth daily. 10/02/21   Sherwood Gambler, MD  QUEtiapine (SEROQUEL) 25 MG tablet Take 1 tablet (25 mg total) by mouth at bedtime. 10/05/21   Raulkar, Clide Deutscher, MD  scopolamine (TRANSDERM-SCOP) 1 MG/3DAYS Place 1 patch (1.5 mg total) onto the skin every 3 (three) days. 10/05/21   Raulkar, Clide Deutscher, MD  tamsulosin (FLOMAX) 0.4 MG CAPS capsule Take 1 capsule (0.4 mg total) by mouth daily after supper. 08/17/21   Setzer, Edman Circle, PA-C  traMADol (ULTRAM) 50 MG tablet Take 1 tablet (50 mg total) by mouth every 12 (twelve) hours as needed (back pain). 08/17/21   Setzer, Edman Circle, PA-C  traZODone (DESYREL) 150 MG tablet Take 1/2 tablet (75 mg total) by mouth at bedtime. 09/02/21   Plovsky, Berneta Sages, MD  triamcinolone cream (KENALOG) 0.1 % Apply 1 application topically 2 (two) times daily. Patient taking differently: Apply 1 application. topically 2 (two) times daily as needed (psoriasis). 12/31/20   Ann Held, DO    Family History Family History  Problem Relation Age of Onset   Heart disease Mother        CHF   Bipolar disorder Mother    Heart disease Father        CHF    Social History Social History   Tobacco Use   Smoking status: Former    Packs/day: 2.00    Years: 40.00    Pack years: 80.00    Types: Cigarettes    Quit date: 02/15/2005    Years since quitting: 16.7   Smokeless tobacco: Never  Vaping  Use   Vaping Use: Never used  Substance  Use Topics   Alcohol use: Yes    Alcohol/week: 0.0 standard drinks    Comment: Occasional   Drug use: No     Allergies   Bee venom, Strawberry extract, Latex, Adhesive [tape], and Statins   Review of Systems Review of Systems  Constitutional: Negative.   Respiratory: Negative.    Cardiovascular: Negative.   Musculoskeletal:  Positive for arthralgias and myalgias. Negative for back pain, gait problem, joint swelling, neck pain and neck stiffness.  Skin: Negative.   Neurological: Negative.     Physical Exam Triage Vital Signs ED Triage Vitals  Enc Vitals Group     BP 11/07/21 1721 136/68     Pulse Rate 11/07/21 1721 74     Resp 11/07/21 1721 18     Temp 11/07/21 1721 98 F (36.7 C)     Temp Source 11/07/21 1721 Oral     SpO2 11/07/21 1721 100 %     Weight --      Height --      Head Circumference --      Peak Flow --      Pain Score 11/07/21 1720 9     Pain Loc --      Pain Edu? --      Excl. in [place]? --    No data found.  Updated Vital Signs BP 136/68 (BP Location: Right Arm)   Pulse 74   Temp 98 F (36.7 C) (Oral)   Resp 18   SpO2 100%   Visual Acuity Right Eye Distance:   Left Eye Distance:   Bilateral Distance:    Right Eye Near:   Left Eye Near:    Bilateral Near:     Physical Exam Constitutional:      Appearance: Normal appearance.  HENT:     Head: Normocephalic.  Eyes:     Extraocular Movements: Extraocular movements intact.  Pulmonary:     Effort: Pulmonary effort is normal.  Musculoskeletal:     Comments: Tenderness along the superior lateral aspect of the left shoulder, no swelling, ecchymosis or deformity noted, limited range of motion with inability to fully abduct arm due to pain, strength 4 out of 5, unable to assess Hawkins sign  Skin:    General: Skin is warm and dry.  Neurological:     Mental Status: He is alert and oriented to person, place, and time. Mental status is at baseline.   Psychiatric:        Mood and Affect: Mood normal.        Behavior: Behavior normal.     UC Treatments / Results  Labs (all labs ordered are listed, but only abnormal results are displayed) Labs Reviewed - No data to display  EKG   Radiology No results found.  Procedures Procedures (including critical care time)  Medications Ordered in UC Medications - No data to display  Initial Impression / Assessment and Plan / UC Course  I have reviewed the triage vital signs and the nursing notes.  Pertinent labs & imaging results that were available during my care of the patient were reviewed by me and considered in my medical decision making (see chart for details).  Acute pain of left shoulder  X-ray negative, discussed findings with patient and spouse, may give ibuprofen and Tylenol as needed for general comfort, may attempt RICE for additional supportive care, may follow-up with PCP or orthopedics if symptoms continue to persist Final Clinical Impressions(s) / UC Diagnoses   Final diagnoses:  None   Discharge Instructions   None    ED Prescriptions   None    PDMP not reviewed this encounter.   Hans Eden, [place] 11/08/21 [phone]

## 2021-11-09 ENCOUNTER — Other Ambulatory Visit: Payer: Self-pay | Admitting: Physician Assistant

## 2021-11-09 ENCOUNTER — Other Ambulatory Visit: Payer: Self-pay | Admitting: Family Medicine

## 2021-11-10 ENCOUNTER — Other Ambulatory Visit (HOSPITAL_BASED_OUTPATIENT_CLINIC_OR_DEPARTMENT_OTHER): Payer: Self-pay

## 2021-11-10 ENCOUNTER — Ambulatory Visit: Payer: PPO | Admitting: Physical Therapy

## 2021-11-10 ENCOUNTER — Ambulatory Visit: Payer: PPO | Admitting: Speech Pathology

## 2021-11-10 ENCOUNTER — Other Ambulatory Visit: Payer: Self-pay | Admitting: Family Medicine

## 2021-11-10 ENCOUNTER — Encounter: Payer: Self-pay | Admitting: Physical Therapy

## 2021-11-10 ENCOUNTER — Encounter: Payer: Self-pay | Admitting: Occupational Therapy

## 2021-11-10 ENCOUNTER — Ambulatory Visit: Payer: PPO | Admitting: Occupational Therapy

## 2021-11-10 DIAGNOSIS — R278 Other lack of coordination: Secondary | ICD-10-CM

## 2021-11-10 DIAGNOSIS — R262 Difficulty in walking, not elsewhere classified: Secondary | ICD-10-CM

## 2021-11-10 DIAGNOSIS — R41840 Attention and concentration deficit: Secondary | ICD-10-CM

## 2021-11-10 DIAGNOSIS — I69318 Other symptoms and signs involving cognitive functions following cerebral infarction: Secondary | ICD-10-CM

## 2021-11-10 DIAGNOSIS — R2689 Other abnormalities of gait and mobility: Secondary | ICD-10-CM

## 2021-11-10 DIAGNOSIS — R296 Repeated falls: Secondary | ICD-10-CM

## 2021-11-10 DIAGNOSIS — I69354 Hemiplegia and hemiparesis following cerebral infarction affecting left non-dominant side: Secondary | ICD-10-CM

## 2021-11-10 DIAGNOSIS — R41841 Cognitive communication deficit: Secondary | ICD-10-CM

## 2021-11-10 DIAGNOSIS — M6281 Muscle weakness (generalized): Secondary | ICD-10-CM

## 2021-11-10 MED ORDER — AMLODIPINE BESYLATE 10 MG PO TABS
10.0000 mg | ORAL_TABLET | Freq: Every morning | ORAL | 0 refills | Status: DC
  Filled 2021-11-10: qty 90, 90d supply, fill #0

## 2021-11-10 MED ORDER — HYDRALAZINE HCL 25 MG PO TABS
25.0000 mg | ORAL_TABLET | Freq: Three times a day (TID) | ORAL | 0 refills | Status: DC
Start: 2021-11-10 — End: 2021-12-12
  Filled 2021-11-10: qty 90, 30d supply, fill #0

## 2021-11-10 MED ORDER — CLOPIDOGREL BISULFATE 75 MG PO TABS
75.0000 mg | ORAL_TABLET | Freq: Every day | ORAL | 0 refills | Status: DC
  Filled 2021-11-10: qty 30, 30d supply, fill #0

## 2021-11-10 MED ORDER — TAMSULOSIN HCL 0.4 MG PO CAPS
0.4000 mg | ORAL_CAPSULE | Freq: Every day | ORAL | 0 refills | Status: DC
  Filled 2021-11-10: qty 90, 90d supply, fill #0

## 2021-11-10 NOTE — Therapy (Signed)
[place] [place], [name], [address] Phone: [phone]   Fax:  [phone]  Physical Therapy Treatment  Patient Details  Name: William [name] MRN: [id number] Date of Birth: Mar 13, 1941 Referring Provider (PT): Etter Sjogren   Encounter Date: 11/10/2021   PT End of Session - 11/10/21 1406     Visit Number 11    Date for PT Re-Evaluation 12/31/21    PT Start Time 1316    PT Stop Time 1358    PT Time Calculation (min) 42 min    Equipment Utilized During Treatment Gait belt    Activity Tolerance Patient tolerated treatment well;Patient limited by pain    Behavior During Therapy [facility] for tasks assessed/performed;Restless;Flat affect             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   Mild, related to stroke history   Nocturia    Renal insufficiency 06/25/2013   S/P carotid endarterectomy    BILATERAL ICA--  PATENT PER DUPLEX  05-19-2012   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   Right calf   Urinary frequency    Vitamin D deficiency     Past Surgical History:  Procedure Laterality Date   APPENDECTOMY  AS CHILD   CARDIOVASCULAR STRESS TEST  03-27-2012  DR CRENSHAW   LOW RISK LEXISCAN STUDY-- PROBABLE NORMAL PERFUSION AND SOFT TISSUE ATTENUATION/  NO ISCHEMIA/ EF 51%   CAROTID ENDARTERECTOMY  Bilateral LEFT  11-12-2008  DR GREG HAYES   RIGHT ICA  2006  ([facility])   CYSTOSCOPY W/ RETROGRADES Bilateral 06/22/2021   Procedure: CYSTOSCOPY WITH RETROGRADE PYELOGRAM;  Surgeon: Franchot Gallo, MD;  Location: [name];  Service: Urology;  Laterality: Bilateral;   CYSTOSCOPY WITH LITHOLAPAXY N/A 02/26/2013   Procedure: CYSTOSCOPY WITH LITHOLAPAXY;  Surgeon: Franchot Gallo, MD;  Location: [facility];  Service: Urology;  Laterality: N/A;   EYE SURGERY  Jan. 2016   cataract surgery both eyes   INGUINAL HERNIA REPAIR Right 11-08-2006   IR KYPHO EA ADDL LEVEL THORACIC OR LUMBAR  02/12/2021   IR RADIOLOGIST EVAL & MGMT  02/18/2021   MASS EXCISION N/A 03/03/2016   Procedure: EXCISION OF BACK  MASS;  Surgeon: Stark Klein, MD;  Location: [place];  Service: General;  Laterality: N/A;   MOHS SURGERY Left 1/ 2016   Dr Nevada Crane-- Basal cell   PROSTATE SURGERY     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-C N/A 06/22/2021   Procedure: TRANSURETHRAL RESECTION OF BLADDER TUMOR;  Surgeon: Franchot Gallo, MD;  Location[facility];  Service: Urology;  Laterality: N/A;   TRANSURETHRAL RESECTION OF PROSTATE N/A 02/26/2013   Procedure: TRANSURETHRAL RESECTION OF THE PROSTATE WITH GYRUS INSTRUMENTS;  Surgeon: Franchot Gallo,  MD;  Location: [place];  Service: Urology;  Laterality: N/A;   TRANSURETHRAL RESECTION OF PROSTATE N/A 06/22/2021   Procedure: TRANSURETHRAL RESECTION OF THE PROSTATE (TURP);  Surgeon: Franchot Gallo, MD;  Location: [facility];  Service: Urology;  Laterality: N/A;    There were no vitals filed for this visit.   Subjective Assessment - 11/10/21 1326     Subjective Patient's wife reports fall at home onto L side. He c/O L shoulder pan. X rays (-). He walks incarrying a small weight in LUE to engage L side. Bed mobility continues to be challenging. They cannot attach a  partial bed rail due to his bed having a full rail which is used while he sleeps.    Currently in Pain? Yes    Pain Location Shoulder    Pain Orientation Right    Pain Descriptors / Indicators Aching;Sore    Pain Type Acute pain    Pain Radiating Towards Reports pain mostly in supraspinatus when he attempts to elevate the limb.    Pain Onset In the past 7 days    Pain Frequency Intermittent    Aggravating Factors  Fell and landed on his L shoulder.                [facility] PT Assessment - 11/10/21 0001       Special Tests    Special Tests Rotator Cuff Impingement    Rotator Cuff Impingment tests Michel Bickers test;Empty Can test      Hawkins-Kennedy test   Findings Positive    Side Left      Empty Can test   Findings Positive    Side Left                           [facility] Adult PT Treatment/Exercise - 11/10/21 0001       Ambulation/Gait   Gait Comments Ambulated indoors, initially he was holding 2# weight in L hand to increase L sided activation. However, due to shoulder pain he tended to hold it against his trunk and it was not as effective today. He walked 120' with CGA, including avoiding obstacles, turns, stepping backwards. Required max VC for direction, but able to negotiate gait without LOB or unsteadiness, shuffling feet throughout.      High Level Balance   High Level Balance Comments standing 6" toe touches, alternating feet, max VC and UUE support      Lumbar Exercises: Aerobic   Nustep lvl 5 x6 mins      Lumbar Exercises: Standing   Other Standing Lumbar Exercises B side stepping up onto airex pad and back down, 10 reps each direction, in parallel bars iwth BUE support. Max VC for technique.                     PT Education - 11/10/21 1405     Education Details Reported positve findings of rotator cuff assessment to patient's wife. She plans to schedule an appointment iwth his Dr.    Terence Lux) Educated Patient;Spouse     Methods Explanation    Comprehension Verbalized understanding              PT Short Term Goals - 11/10/21 1415       PT SHORT TERM GOAL #1   Title independent with initial HEP    Time 2    Period Weeks    Status On-going  PT Long Term Goals - 11/08/2021 1618       PT LONG TERM GOAL #1   Title Understand fall risks, the importance of exercise and flexibility to health and function    Status On-going      PT LONG TERM GOAL #2   Title decrease TUG time to 13 seconds    Status Partially Met      PT LONG TERM GOAL #3   Title walk around our building without rest     Status On-going      PT LONG TERM GOAL #4   Title increase LE strength to 4+/5    Status On-going                   Plan - 11/10/21 1406     Clinical Impression Statement Patient demosntrates very poor attention, very easily distracted and requires max VC for attention to task. he attemtps to participate. His wife reports fall at home, landing on L shoulder, with increased pain noted. X rays of humerus were Neg. Therapist attempted to engage LUE in treatment activities, but he was guarding it. Rotator Cuff testing was positive for Hawkins and empty can tests, reporting pain in suprasinatus. Patient's wife educated to findings. Treatment then focused on balance training.    Stability/Clinical Decision Making Evolving/Moderate complexity    Clinical Decision Making Moderate    Rehab Potential Good    PT Frequency 2x / week    PT Duration Other (comment)    PT Treatment/Interventions ADLs/Self Care Home Management;Electrical Stimulation;Moist Heat;Gait training;Neuromuscular re-education;Balance training;Therapeutic exercise;Therapeutic activities;Functional mobility training;Stair training;Patient/family education;Manual techniques    PT Next Visit Plan Re-assess L shoulder pain, balance training.    Consulted and Agree with Plan of Care Patient;Family member/caregiver              Patient will benefit from skilled therapeutic intervention in order to improve the following deficits and impairments:  Abnormal gait, Decreased coordination, Decreased range of motion, Difficulty walking, Increased muscle spasms, Cardiopulmonary status limiting activity, Decreased endurance, Decreased activity tolerance, Pain, Impaired flexibility, Decreased balance, Decreased mobility, Decreased strength, Postural dysfunction  Visit Diagnosis: Attention and concentration deficit  Other abnormalities of gait and mobility  Other lack of coordination  Other symptoms and signs involving cognitive functions following cerebral infarction  Repeated falls  Difficulty in walking, not elsewhere classified  Hemiplegia and hemiparesis following cerebral infarction affecting left non-dominant side (HCC)  Muscle weakness (generalized)     Problem List Patient Active Problem List   Diagnosis Date Noted   Thrush 10/08/2021   Hemiplegia, dominant side S/P CVA (cerebrovascular accident) ([place]) 09/11/2021   Insomnia    Prediabetes    Chronic kidney disease (CKD), stage IV (severe) (HCC)    Basal ganglia infarction ([place]) 07/29/2021   Transaminitis 07/27/2021   UTI (urinary tract infection) 07/27/2021   CVA (cerebral vascular accident) ([place]) 07/27/2021   Fall at home, initial encounter 07/27/2021   Hyperglycemia 07/27/2021   CKD (chronic kidney disease), stage IV ([place]) 07/27/2021   Cholelithiasis 07/27/2021   Hypoxia 07/27/2021   Nausea and vomiting 28/36/6294   Acute metabolic encephalopathy 76/54/6503   Normocytic anemia 07/27/2021   Chronic back pain 07/27/2021   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[place]) 06/22/2021   Closed fracture of first lumbar vertebra with routine healing 02/03/2021   Closed fracture of multiple ribs 11/18/2020   Anxiety 01/29/2020   Leg pain, bilateral 01/29/2020   Ingrown toenail 07/13/2019   Lumbar spondylosis 05/02/2018  Pain in left  knee 03/09/2018   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   Trochanteric bursitis of left hip 01/16/2018   Preventative health care 09/26/2017   HTN (hypertension) 07/19/2015   Hyperlipidemia 07/19/2015   Great toe pain 02/11/2014   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 [place], [name], [address] Phone: [phone]   Fax:  [phone]  Name: MARVENS HOLLARS MRN: [id number] Date of Birth: April 26, 1941

## 2021-11-10 NOTE — Therapy (Unsigned)
OUTPATIENT OCCUPATIONAL THERAPY TREATMENT NOTE   Patient Name: William [name] MRN: [id number] DOB:September 27, 1940, 81 y.o., male Today's Date: 11/10/2021  PCP: Roma Schanz, DO REFERRING PROVIDER: Carollee Herter, Alferd Apa, DO   OT End of Session - 11/10/21 1457     Visit Number 8    Number of Visits 13    Date for OT Re-Evaluation 12/14/21    Authorization Type [employer]    Authorization Time Period VL: [place]    Progress Note Due on Visit 10    OT Start Time 1447    OT Stop Time 1530    OT Time Calculation (min) 43 min    Activity Tolerance Patient tolerated treatment well   somewhat limited by activity tolerance   Behavior During Therapy [facility] for tasks assessed/performed;Restless;Flat affect            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   Mild, related to stroke history   Nocturia    Renal insufficiency 06/25/2013   S/P carotid endarterectomy    BILATERAL ICA--  PATENT PER DUPLEX  05-19-2012   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   Right calf   Urinary frequency    Vitamin D deficiency    Past Surgical History:  Procedure Laterality Date   APPENDECTOMY  AS CHILD   CARDIOVASCULAR STRESS TEST  03-27-2012  DR CRENSHAW   LOW RISK LEXISCAN STUDY-- PROBABLE NORMAL PERFUSION AND SOFT TISSUE ATTENUATION/  NO ISCHEMIA/ EF 51%   CAROTID  ENDARTERECTOMY Bilateral LEFT  11-12-2008  DR GREG HAYES   RIGHT ICA  2006  ([facility])   CYSTOSCOPY W/ RETROGRADES Bilateral 06/22/2021   Procedure: CYSTOSCOPY WITH RETROGRADE PYELOGRAM;  Surgeon: Franchot Gallo, MD;  Location: [facility];  Service: Urology;  Laterality: Bilateral;   CYSTOSCOPY WITH LITHOLAPAXY N/A 02/26/2013   Procedure: CYSTOSCOPY WITH LITHOLAPAXY;  Surgeon: Franchot Gallo, MD;  Location: [facility];  Service: Urology;  Laterality: N/A;   EYE SURGERY  Jan. 2016   cataract surgery both eyes   INGUINAL HERNIA REPAIR Right 11-08-2006   IR KYPHO EA ADDL LEVEL THORACIC OR LUMBAR  02/12/2021   IR RADIOLOGIST EVAL & MGMT  02/18/2021   MASS EXCISION N/A 03/03/2016   Procedure: EXCISION OF BACK  MASS;  Surgeon: Stark Klein, MD;  Location: [place];  Service: General;  Laterality: N/A;   MOHS SURGERY Left 1/ 2016   Dr Nevada Crane-- Basal cell   PROSTATE SURGERY     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-C N/A 06/22/2021   Procedure: TRANSURETHRAL RESECTION OF BLADDER TUMOR;  Surgeon: Franchot Gallo, MD;  Location: [facility];  Service: Urology;  Laterality: N/A;   TRANSURETHRAL RESECTION OF PROSTATE N/A 02/26/2013   Procedure: TRANSURETHRAL RESECTION OF THE  PROSTATE WITH GYRUS INSTRUMENTS;  Surgeon: Franchot Gallo, MD;  Location: [facility];  Service: Urology;  Laterality: N/A;   TRANSURETHRAL RESECTION OF PROSTATE N/A 06/22/2021   Procedure: TRANSURETHRAL RESECTION OF THE PROSTATE (TURP);  Surgeon: Franchot Gallo, MD;  Location: [facility];  Service: Urology;  Laterality: N/A;   Patient Active Problem List   Diagnosis Date Noted   Thrush 10/08/2021   Hemiplegia, dominant side S/P CVA (cerebrovascular accident) ([place]) 09/11/2021   Insomnia    Prediabetes    Chronic kidney disease (CKD), stage IV (severe) (HCC)    Basal ganglia infarction ([place]) 07/29/2021    Transaminitis 07/27/2021   UTI (urinary tract infection) 07/27/2021   CVA (cerebral vascular accident) ([place]) 07/27/2021   Fall at home, initial encounter 07/27/2021   Hyperglycemia 07/27/2021   CKD (chronic kidney disease), stage IV ([place]) 07/27/2021   Cholelithiasis 07/27/2021   Hypoxia 07/27/2021   Nausea and vomiting 13/24/4010   Acute metabolic encephalopathy 27/25/3664   Normocytic anemia 07/27/2021   Chronic back pain 07/27/2021   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[place]) 06/22/2021   Closed fracture of first lumbar vertebra with routine healing 02/03/2021   Closed fracture of multiple ribs 11/18/2020   Anxiety 01/29/2020   Leg pain, bilateral 01/29/2020   Ingrown toenail 07/13/2019   Lumbar spondylosis 05/02/2018   Pain in left knee 03/09/2018   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   Trochanteric bursitis of left hip 01/16/2018   Preventative health care 09/26/2017   HTN (hypertension) 07/19/2015   Hyperlipidemia 07/19/2015   Great toe pain 02/11/2014   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   Obesity (BMI 30-39.9) 06/25/2013   Renal insufficiency 06/25/2013   Weakness of left arm 06/25/2013   Sebaceous cyst 03/03/2011   Sprain of lumbar region 07/31/2010   Rib pain, left 08/29/2009   Carotid artery stenosis, asymptomatic, bilateral 05/02/2009   Eczema, atopic 05/31/2008   Vitamin D deficiency 03/01/2008   BPH (benign prostatic hyperplasia) 08/06/2007   Fasting hyperglycemia 12/21/2006   History of right MCA infarct 2006    ONSET DATE: 07/27/21  REFERRING DIAG: R41.4 (ICD-10-CM) - Hemi-inattention I63.9 (ICD-10-CM) - Cerebrovascular accident (CVA), unspecified mechanism ([place])     THERAPY DIAG:  Attention and concentration deficit  Repeated falls  Other abnormalities of gait and mobility  Other symptoms and signs involving cognitive functions following cerebral infarction  Other lack of coordination   PERTINENT HISTORY: L  basal ganglia and corona radiata infarct 07/27/21 (had documented AMS for about 5 days prior w/ hospital work-up including MRI negative and symptoms attributed to a UTI); L lower homonymous quadrantanopia. Other PMH includes R MCA infarct in 2006 w/ L-sided hemiparesis, NCD, carotid artery disease (bilateral), HTN, HLD,CKD3, and hx of bladder cancer  PRECAUTIONS: Fall risk  SUBJECTIVE:   SUBJECTIVE ASSESSMENT: Pt and his wife report pt fell on Friday (11/06/21); the cause of fall was unclear, but pt reports pain in his L shoulder that has stayed the same since then (indicated region around spine of scapula)  PAIN: Are you having pain? No   OBJECTIVE:   TODAY'S TREATMENT - 11/10/21: OT problem-solved w/ pt and his wife to develop simple, practicable steps for making instant coffee each morning; OT created handout and administered to trial at home, providing relevant education regarding most beneficial methods of creating practicable steps for activities at home based on EBP Large amplitude movements to address trunk rotation and functional forward reach w/ wrist and hand extended (RUE  only this session); OT modified activities according to pt tolerance of L shoulder pain w/ pt requiring significant cues (verbal, visual, gestural), extended time for processing, and repetition for success   PATIENT EDUCATION: Continued condition-specific education, particularly related to benefit practicable steps, blocked practice, and cues for errorless learning when participating in tasks at home  Person educated: Patient and Spouse Education method: Explanation and Demonstration Education comprehension: verbalized understanding   GOALS: Goals reviewed with patient? Yes   SHORT TERM GOALS: Target date:    STG   Status:  1 Pt/caregiver will verbalize understanding of visual perceptual compensatory strategies Baseline: Decreased knowledge of visual deficits and strategies On-going  2 Pt will  complete symbol cancellation task w/ at least 50% accuracy using visual compensatory strategies to indicate improved attention Baseline: 40% accuracy w/ multiple errors On-going  3 Pt will participate in bilateral coordination activity w/ no more than 3 cues for incorporation of LUE Baseline: L-sided inattention On-going      LONG TERM GOALS: Target date: 11/26/2021   LTG   Status:  1 Pt will be able to thread LUE into shirt sleeve w/ SPV in at least 1/2 trials Baseline: Min A to don/doff shirt sleeve on L side On-going  2 Pt will be able to thread bottoms on BLEs w/ Mod A Baseline: Max A for LB dressing On-going  3 Pt will demonstrate supine-to-sit transition w/ cues from caregiver and incorporation of AE/DME prn Baseline: Mod-Max A w/ bed mobility On-going  4 Pt will be able to use TV remote (or adaptive remote) to complete 1 task (e.g., turn TV on/of, turn volume up/down) w/ SPV Baseline: Difficulty  On-going  5 Pt will increase Barthel Index score by at least 5 to indicate improved level of participation in BADLs Baseline: 60/100 On-going      ASSESSMENT:   CLINICAL IMPRESSION: OT continued to address participation in functional tasks at home for safety and decreased caregiver strain. Due to pt continuing to demonstrate difficulty w/ initiation and perseveration, OT introduced large amplitude movements this session as NMR preparatory tasks for functional mobility and transitions, which were ultimately limited by shoulder pain s/p fall. OT will continue to monitor shoulder pain and address functional tasks w/ relevant and specific preparatory tasks prn.   PERFORMANCE DEFICITS in functional skills including ADLs, IADLs, coordination, dexterity, Vina, GMC, mobility, balance, and vision, cognitive skills including attention, memory, orientation, problem solving, safety awareness, sequencing, and temperament/personality, and psychosocial skills including environmental adaptation, interpersonal  interactions, and routines and behaviors.    IMPAIRMENTS are limiting patient from ADLs, IADLs, rest and sleep, leisure, and social participation.    COMORBIDITIES may have co-morbidities that affects occupational performance. Patient will benefit from skilled OT to address above impairments and improve overall function.     PLAN: OT FREQUENCY: 2x/week   OT DURATION: 6 weeks   PLANNED INTERVENTIONS: self care/ADL training, therapeutic exercise, therapeutic activity, neuromuscular re-education, balance training, functional mobility training, aquatic therapy, splinting, moist heat, cryotherapy, patient/family education, cognitive remediation/compensation, visual/perceptual remediation/compensation, psychosocial skills training, energy conservation, and DME and/or AE instructions   RECOMMENDED OTHER SERVICES: Currently receiving PT/ST services at this location   CONSULTED AND AGREED WITH PLAN OF CARE: Patient and family member/caregiver   PLAN FOR NEXT SESSION: Large amplitude movements (trunk rotation, cross body reaching, trunk extension); continue to address functional transitions/transfers (elevated HOB; bed ladder; bed rail), dressing, and retropulsion and L-sided attention/bilateral tasks   Kathrine Cords, MSOT, OTR/L 11/10/21, 3:59 PM

## 2021-11-11 ENCOUNTER — Other Ambulatory Visit (HOSPITAL_BASED_OUTPATIENT_CLINIC_OR_DEPARTMENT_OTHER): Payer: Self-pay

## 2021-11-11 ENCOUNTER — Encounter: Payer: Self-pay | Admitting: Speech Pathology

## 2021-11-11 ENCOUNTER — Ambulatory Visit: Payer: PPO | Admitting: Speech Pathology

## 2021-11-11 DIAGNOSIS — I679 Cerebrovascular disease, unspecified: Secondary | ICD-10-CM | POA: Diagnosis not present

## 2021-11-11 DIAGNOSIS — I10 Essential (primary) hypertension: Secondary | ICD-10-CM | POA: Diagnosis not present

## 2021-11-11 DIAGNOSIS — I69354 Hemiplegia and hemiparesis following cerebral infarction affecting left non-dominant side: Secondary | ICD-10-CM | POA: Diagnosis not present

## 2021-11-11 DIAGNOSIS — R41841 Cognitive communication deficit: Secondary | ICD-10-CM

## 2021-11-11 NOTE — Therapy (Signed)
OUTPATIENT SPEECH LANGUAGE PATHOLOGY TREATMENT NOTE   Patient Name: William [name] MRN: [id number] DOB:[birth year], 81 y.o., male Today's Date: 11/11/2021  PCP: Ann Held, DO  REFERRING PROVIDER: Carollee Herter, Alferd Apa, DO   END OF SESSION:   End of Session - 11/11/21 1539     Visit Number 12    Number of Visits 17    Date for SLP Re-Evaluation 12/08/21    SLP Start Time 51    SLP Stop Time  1612    SLP Time Calculation (min) 38 min    Activity Tolerance Patient tolerated treatment well;No increased pain             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   Mild, related to stroke history   Nocturia    Renal insufficiency 06/25/2013   S/P carotid endarterectomy    BILATERAL ICA--  PATENT PER DUPLEX  05-19-2012   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   Right calf   Urinary frequency    Vitamin D deficiency    Past Surgical History:  Procedure Laterality Date   APPENDECTOMY  AS CHILD   CARDIOVASCULAR STRESS TEST  03-27-2012  DR CRENSHAW   LOW RISK LEXISCAN STUDY-- PROBABLE NORMAL PERFUSION AND SOFT TISSUE ATTENUATION/  NO ISCHEMIA/ EF 51%   CAROTID ENDARTERECTOMY Bilateral LEFT  11-12-2008  DR GREG HAYES   RIGHT ICA  2006  ([facility])   CYSTOSCOPY W/ RETROGRADES Bilateral 06/22/2021   Procedure: CYSTOSCOPY WITH RETROGRADE PYELOGRAM;  Surgeon:  Franchot Gallo, MD;  Location: [facility];  Service: Urology;  Laterality: Bilateral;   CYSTOSCOPY WITH LITHOLAPAXY N/A 02/26/2013   Procedure: CYSTOSCOPY WITH LITHOLAPAXY;  Surgeon: Franchot Gallo, MD;  Location: [facility];  Service: Urology;  Laterality: N/A;   EYE SURGERY  Jan. 2016   cataract surgery both eyes   INGUINAL HERNIA REPAIR Right 11-08-2006   IR KYPHO EA ADDL LEVEL THORACIC OR LUMBAR  02/12/2021   IR RADIOLOGIST EVAL & MGMT  02/18/2021   MASS EXCISION N/A 03/03/2016   Procedure: EXCISION OF BACK  MASS;  Surgeon: Stark Klein, MD;  Location: [place];  Service: General;  Laterality: N/A;   MOHS SURGERY Left 1/ 2016   Dr Nevada Crane-- Basal cell   PROSTATE SURGERY     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-C N/A 06/22/2021   Procedure: TRANSURETHRAL RESECTION OF BLADDER TUMOR;  Surgeon: Franchot Gallo, MD;  Location: [facility];  Service: Urology;  Laterality: N/A;   TRANSURETHRAL RESECTION OF PROSTATE N/A 02/26/2013   Procedure: TRANSURETHRAL RESECTION OF THE PROSTATE WITH GYRUS INSTRUMENTS;  Surgeon: Franchot Gallo, MD;  Location: [place];  Service: Urology;  Laterality: N/A;   TRANSURETHRAL RESECTION OF PROSTATE N/A 06/22/2021  Procedure: TRANSURETHRAL RESECTION OF THE PROSTATE (TURP);  Surgeon: Franchot Gallo, MD;  Location: [facility];  Service: Urology;  Laterality: N/A;   Patient Active Problem List   Diagnosis Date Noted   Thrush 10/08/2021   Hemiplegia, dominant side S/P CVA (cerebrovascular accident) ([place]) 09/11/2021   Insomnia    Prediabetes    Chronic kidney disease (CKD), stage IV (severe) (HCC)    Basal ganglia infarction ([place]) 07/29/2021   Transaminitis 07/27/2021   UTI (urinary tract infection) 07/27/2021   CVA (cerebral vascular accident) ([place]) 07/27/2021   Fall at home, initial encounter 07/27/2021   Hyperglycemia 07/27/2021   CKD  (chronic kidney disease), stage IV ([place]) 07/27/2021   Cholelithiasis 07/27/2021   Hypoxia 07/27/2021   Nausea and vomiting 69/62/9528   Acute metabolic encephalopathy 41/32/4401   Normocytic anemia 07/27/2021   Chronic back pain 07/27/2021   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[place]) 06/22/2021   Closed fracture of first lumbar vertebra with routine healing 02/03/2021   Closed fracture of multiple ribs 11/18/2020   Anxiety 01/29/2020   Leg pain, bilateral 01/29/2020   Ingrown toenail 07/13/2019   Lumbar spondylosis 05/02/2018   Pain in left knee 03/09/2018   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   Trochanteric bursitis of left hip 01/16/2018   Preventative health care 09/26/2017   HTN (hypertension) 07/19/2015   Hyperlipidemia 07/19/2015   Great toe pain 02/11/2014   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   Obesity (BMI 30-39.9) 06/25/2013   Renal insufficiency 06/25/2013   Weakness of left arm 06/25/2013   Sebaceous cyst 03/03/2011   Sprain of lumbar region 07/31/2010   Rib pain, left 08/29/2009   Carotid artery stenosis, asymptomatic, bilateral 05/02/2009   Eczema, atopic 05/31/2008   Vitamin D deficiency 03/01/2008   BPH (benign prostatic hyperplasia) 08/06/2007   Fasting hyperglycemia 12/21/2006   History of right MCA infarct 2006    ONSET DATE: 07/27/21  REFERRING DIAG: I63.9 (ICD-10-CM) - Cerebrovascular accident (CVA), unspecified mechanism ([place])   THERAPY DIAG:  Cognitive communication deficit  SUBJECTIVE: "How is your baby?"  PAIN:  Are you having pain? No     OBJECTIVE:   TODAY'S TREATMENT: Pt was seen for skilled ST services with focus on cognition. Completed cognitive task to facilitate increased attention. SLP edu on the importance of brain breaks when attention is beginning to falter and pt exhibits increased confusion. During 30 min of work, pt required 2, 1 minute brain breaks. Pt was able to verbalize that he needed a  break and was starting to get confused which indicated increased awareness. SLP edu on situations in which he could use this phrase.  Pt continues to require verbal cues for redirection and to "slow down", as pt's perseverative nature impacts his ability to complete tasks efficiently. Will continue with current POC.   PATIENT EDUCATION: Education details: Cognitive-communication impairment Person educated: Patient and Spouse Education method: Customer service manager Education comprehension: verbalized understanding, verbal cues required, and needs further education       GOALS: Goals reviewed with patient? Yes   SHORT TERM GOALS: Target date: 10/18/2021   Patient will demonstrate orientation x4 with modA verbal cues to refer to compensatory aids. Baseline: Goal status: MET   2.  Patient/wife will recall safe swallow strategies to use at home to increase safety with consuming food/drink PO with minA.  Baseline:  Goal status: INITIAL   3.  Pt will comprehend 1-step directions with 80% accuracy given less than 2 verbal cues.  Baseline:  Goal status: INITIAL   4.   Pt will attend to cognitive-communication task for 5 minutes given less than 2 verbal cues to increase safety. Baseline:  Goal status: INITIAL       LONG TERM GOALS: Target date: 12/03/2021   Wife will report successful use of communication strategies at home to decrease communication breakdowns independently. Baseline:  Goal status: INITIAL   2.  Pt will demonstrate orientation x4 with supA verbal cues.  Baseline:  Goal status: INITIAL   3.  Pt will follow simple, 2-step directions with 80% accuracy given less than 2 verbal cues. Baseline:  Goal status: INITIAL   4.  Pt will attend to cognitive-communication task for 10 minutes given less than 2 verbal cues to increase safety. Baseline:  Goal status: INITIAL     ASSESSMENT:   CLINICAL IMPRESSION: See tx note.  SLP rec skilled ST services to address  cog-comm impairment to maximize functional communication and decrease caregiver burden. Continue with current POC.     OBJECTIVE IMPAIRMENTS include attention, memory, awareness, executive functioning, dysarthria, and dysphagia. These impairments are limiting patient from ADLs/IADLs, effectively communicating at home and in community, and safety when swallowing. Factors affecting potential to achieve goals and functional outcome are ability to learn/carryover information and severity of impairments.. Patient will benefit from skilled SLP services to address above impairments and improve overall function.   REHAB POTENTIAL: Good   PLAN: SLP FREQUENCY: 2x/week   SLP DURATION: 8 weeks   PLANNED INTERVENTIONS: Aspiration precaution training, Environmental controls, Cueing hierachy, Cognitive reorganization, Internal/external aids, Functional tasks, SLP instruction and feedback, Compensatory strategies, and Patient/family education    [employer], [place] 11/11/2021, 3:41 PM

## 2021-11-11 NOTE — Therapy (Signed)
OUTPATIENT SPEECH LANGUAGE PATHOLOGY TREATMENT NOTE   Patient Name: William [name] MRN: [id number] DOB:November 23, 1940, 81 y.o., male Today's Date: 11/11/2021  PCP: Ann Held, DO  REFERRING PROVIDER: Carollee Herter, Alferd Apa, DO   END OF SESSION:   End of Session - 11/11/21 1516     Visit Number 11    Number of Visits 17    Date for SLP Re-Evaluation 12/08/21    SLP Start Time 1400    SLP Stop Time  8032    SLP Time Calculation (min) 45 min    Activity Tolerance Patient tolerated treatment well;No increased pain             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   Mild, related to stroke history   Nocturia    Renal insufficiency 06/25/2013   S/P carotid endarterectomy    BILATERAL ICA--  PATENT PER DUPLEX  05-19-2012   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   Right calf   Urinary frequency    Vitamin D deficiency    Past Surgical History:  Procedure Laterality Date   APPENDECTOMY  AS CHILD   CARDIOVASCULAR STRESS TEST  03-27-2012  DR CRENSHAW   LOW RISK LEXISCAN STUDY-- PROBABLE NORMAL PERFUSION AND SOFT TISSUE ATTENUATION/  NO ISCHEMIA/ EF 51%   CAROTID ENDARTERECTOMY Bilateral LEFT  11-12-2008  DR GREG HAYES   RIGHT ICA  2006  ([facility])   CYSTOSCOPY W/ RETROGRADES Bilateral 06/22/2021   Procedure: CYSTOSCOPY WITH RETROGRADE PYELOGRAM;  Surgeon:  Franchot Gallo, MD;  Location: [facility];  Service: Urology;  Laterality: Bilateral;   CYSTOSCOPY WITH LITHOLAPAXY N/A 02/26/2013   Procedure: CYSTOSCOPY WITH LITHOLAPAXY;  Surgeon: Franchot Gallo, MD;  Location: [facility];  Service: Urology;  Laterality: N/A;   EYE SURGERY  Jan. 2016   cataract surgery both eyes   INGUINAL HERNIA REPAIR Right 11-08-2006   IR KYPHO EA ADDL LEVEL THORACIC OR LUMBAR  02/12/2021   IR RADIOLOGIST EVAL & MGMT  02/18/2021   MASS EXCISION N/A 03/03/2016   Procedure: EXCISION OF BACK  MASS;  Surgeon: Stark Klein, MD;  Location: [place];  Service: General;  Laterality: N/A;   MOHS SURGERY Left 1/ 2016   Dr Nevada Crane-- Basal cell   PROSTATE SURGERY     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-C N/A 06/22/2021   Procedure: TRANSURETHRAL RESECTION OF BLADDER TUMOR;  Surgeon: Franchot Gallo, MD;  Location: [facility];  Service: Urology;  Laterality: N/A;   TRANSURETHRAL RESECTION OF PROSTATE N/A 02/26/2013   Procedure: TRANSURETHRAL RESECTION OF THE PROSTATE WITH GYRUS INSTRUMENTS;  Surgeon: Franchot Gallo, MD;  Location: [facility];  Service: Urology;  Laterality: N/A;   TRANSURETHRAL RESECTION OF PROSTATE N/A 06/22/2021  Procedure: TRANSURETHRAL RESECTION OF THE PROSTATE (TURP);  Surgeon: Franchot Gallo, MD;  Location: [facility];  Service: Urology;  Laterality: N/A;   Patient Active Problem List   Diagnosis Date Noted   Thrush 10/08/2021   Hemiplegia, dominant side S/P CVA (cerebrovascular accident) ([place]) 09/11/2021   Insomnia    Prediabetes    Chronic kidney disease (CKD), stage IV (severe) (HCC)    Basal ganglia infarction ([place]) 07/29/2021   Transaminitis 07/27/2021   UTI (urinary tract infection) 07/27/2021   CVA (cerebral vascular accident) ([place]) 07/27/2021   Fall at home, initial encounter 07/27/2021   Hyperglycemia 07/27/2021   CKD  (chronic kidney disease), stage IV ([place]) 07/27/2021   Cholelithiasis 07/27/2021   Hypoxia 07/27/2021   Nausea and vomiting 97/94/8016   Acute metabolic encephalopathy 55/37/4827   Normocytic anemia 07/27/2021   Chronic back pain 07/27/2021   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[place]) 06/22/2021   Closed fracture of first lumbar vertebra with routine healing 02/03/2021   Closed fracture of multiple ribs 11/18/2020   Anxiety 01/29/2020   Leg pain, bilateral 01/29/2020   Ingrown toenail 07/13/2019   Lumbar spondylosis 05/02/2018   Pain in left knee 03/09/2018   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   Trochanteric bursitis of left hip 01/16/2018   Preventative health care 09/26/2017   HTN (hypertension) 07/19/2015   Hyperlipidemia 07/19/2015   Great toe pain 02/11/2014   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   Obesity (BMI 30-39.9) 06/25/2013   Renal insufficiency 06/25/2013   Weakness of left arm 06/25/2013   Sebaceous cyst 03/03/2011   Sprain of lumbar region 07/31/2010   Rib pain, left 08/29/2009   Carotid artery stenosis, asymptomatic, bilateral 05/02/2009   Eczema, atopic 05/31/2008   Vitamin D deficiency 03/01/2008   BPH (benign prostatic hyperplasia) 08/06/2007   Fasting hyperglycemia 12/21/2006   History of right MCA infarct 2006    ONSET DATE: 07/27/21  REFERRING DIAG: I63.9 (ICD-10-CM) - Cerebrovascular accident (CVA), unspecified mechanism ([place])   THERAPY DIAG:  Cognitive communication deficit  SUBJECTIVE: "I'm good today, I'm good today."  PAIN:  Are you having pain? No     OBJECTIVE:   TODAY'S TREATMENT: Pt was seen for skilled ST services with focus on cognition. Completed cognitive task to facilitate increased attention. SLP edu on the importance of brain breaks when attention is beginning to falter and pt exhibits increased confusion. During 30 min of work, pt required 8, 2 minute brain breaks. SLP provided pt with visual cue  "I need a break" on white board to increase pt initiation of verbalizations for help and increase awareness.  Pt continues to require verbal cues for redirection and to "slow down", as pt's perseverative nature impacts his ability to complete tasks efficiently. Will continue with current POC.   PATIENT EDUCATION: Education details: Cognitive-communication impairment Person educated: Patient and Spouse Education method: Customer service manager Education comprehension: verbalized understanding, verbal cues required, and needs further education       GOALS: Goals reviewed with patient? Yes   SHORT TERM GOALS: Target date:    Patient will demonstrate orientation x4 with modA verbal cues to refer to compensatory aids. Baseline: Goal status: MET   2.  Patient/wife will recall safe swallow strategies to use at home to increase safety with consuming food/drink PO with minA.  Baseline:  Goal status: INITIAL   3.  Pt will comprehend 1-step directions with 80% accuracy given less than 2 verbal cues. Baseline:  Goal status: INITIAL  4.   Pt will attend to cognitive-communication task for 5 minutes given less than 2 verbal cues to increase safety. Baseline:  Goal status: INITIAL       LONG TERM GOALS: Target date: 12/03/2021   Wife will report successful use of communication strategies at home to decrease communication breakdowns independently. Baseline:  Goal status: INITIAL   2.  Pt will demonstrate orientation x4 with supA verbal cues.  Baseline:  Goal status: INITIAL   3.  Pt will follow simple, 2-step directions with 80% accuracy given less than 2 verbal cues. Baseline:  Goal status: INITIAL   4.  Pt will attend to cognitive-communication task for 10 minutes given less than 2 verbal cues to increase safety. Baseline:  Goal status: INITIAL     ASSESSMENT:   CLINICAL IMPRESSION: See tx note.  SLP rec skilled ST services to address cog-comm impairment to  maximize functional communication and decrease caregiver burden. Continue with current POC.     OBJECTIVE IMPAIRMENTS include attention, memory, awareness, executive functioning, dysarthria, and dysphagia. These impairments are limiting patient from ADLs/IADLs, effectively communicating at home and in community, and safety when swallowing. Factors affecting potential to achieve goals and functional outcome are ability to learn/carryover information and severity of impairments.. Patient will benefit from skilled SLP services to address above impairments and improve overall function.   REHAB POTENTIAL: Good   PLAN: SLP FREQUENCY: 2x/week   SLP DURATION: 8 weeks   PLANNED INTERVENTIONS: Aspiration precaution training, Environmental controls, Cueing hierachy, Cognitive reorganization, Internal/external aids, Functional tasks, SLP instruction and feedback, Compensatory strategies, and Patient/family education    [place], [place] 11/11/2021, 3:17 PM

## 2021-11-12 ENCOUNTER — Ambulatory Visit: Payer: PPO | Admitting: Physical Therapy

## 2021-11-12 ENCOUNTER — Ambulatory Visit: Payer: PPO | Admitting: Speech Pathology

## 2021-11-12 ENCOUNTER — Encounter: Payer: Self-pay | Admitting: Family Medicine

## 2021-11-12 ENCOUNTER — Ambulatory Visit (INDEPENDENT_AMBULATORY_CARE_PROVIDER_SITE_OTHER): Payer: PPO | Admitting: Family Medicine

## 2021-11-12 ENCOUNTER — Ambulatory Visit: Payer: PPO | Admitting: Occupational Therapy

## 2021-11-12 VITALS — BP 132/80 | HR 81 | Temp 97.8°F | Resp 18 | Ht 71.0 in

## 2021-11-12 DIAGNOSIS — R350 Frequency of micturition: Secondary | ICD-10-CM | POA: Diagnosis not present

## 2021-11-12 DIAGNOSIS — M25512 Pain in left shoulder: Secondary | ICD-10-CM

## 2021-11-12 DIAGNOSIS — R82998 Other abnormal findings in urine: Secondary | ICD-10-CM | POA: Insufficient documentation

## 2021-11-12 LAB — POC URINALSYSI DIPSTICK (AUTOMATED)
Blood, UA: NEGATIVE
Glucose, UA: NEGATIVE
Nitrite, UA: NEGATIVE
Protein, UA: POSITIVE — AB
Spec Grav, UA: 1.025 (ref 1.010–1.025)
Urobilinogen, UA: 0.2 E.U./dL
pH, UA: 5 (ref 5.0–8.0)

## 2021-11-12 NOTE — Patient Instructions (Signed)
Shoulder Pain Many things can cause shoulder pain, including: An injury to the shoulder. Overuse of the shoulder. Arthritis. The source of the pain can be: Inflammation. An injury to the shoulder joint. An injury to a tendon, ligament, or bone. Follow these instructions at home: Pay attention to changes in your symptoms. Let your health care provider know about them. Follow these instructions to relieve your pain. If you have a sling: Wear the sling as told by your health care provider. Remove it only as told by your health care provider. Loosen the sling if your fingers tingle, become numb, or turn cold and blue. Keep the sling clean. If the sling is not waterproof: Do not let it get wet. Remove it to shower or bathe. Move your arm as little as possible, but keep your hand moving to prevent swelling. Managing pain, stiffness, and swelling  If directed, put ice on the painful area: Put ice in a plastic bag. Place a towel between your skin and the bag. Leave the ice on for 20 minutes, 2-3 times per day. Stop applying ice if it does not help with the pain. Squeeze a soft ball or a foam pad as much as possible. This helps to keep the shoulder from swelling. It also helps to strengthen the arm. General instructions Take over-the-counter and prescription medicines only as told by your health care provider. Keep all follow-up visits as told by your health care provider. This is important. Contact a health care provider if: Your pain gets worse. Your pain is not relieved with medicines. New pain develops in your arm, hand, or fingers. Get help right away if: Your arm, hand, or fingers: Tingle. Become numb. Become swollen. Become painful. Turn white or blue. Summary Shoulder pain can be caused by an injury, overuse, or arthritis. Pay attention to changes in your symptoms. Let your health care provider know about them. This condition may be treated with a sling, ice, and pain  medicines. Contact your health care provider if the pain gets worse or new pain develops. Get help right away if your arm, hand, or fingers tingle or become numb, swollen, or painful. Keep all follow-up visits as told by your health care provider. This is important. This information is not intended to replace advice given to you by your health care provider. Make sure you discuss any questions you have with your health care provider. Document Revised: 02/13/2021 Document Reviewed: 02/13/2021 Elsevier Patient Education  2023 [employer]  

## 2021-11-12 NOTE — Assessment & Plan Note (Signed)
UA normal  Culture pending

## 2021-11-12 NOTE — Progress Notes (Signed)
Subjective:   By signing my name below, I, Carylon Perches, attest that this documentation has been prepared under the direction and in the presence of Ann Held DO 11/12/2021   Patient ID: William [name], male    DOB: [birth year], 81 y.o.   MRN: [id number]  Chief Complaint  Patient presents with   Shoulder Pain    Left shoulder, fall over the weekend. Pt was seen at [facility].    Urinary Frequency    X1 week, no pain, just frequency.     Shoulder Pain  Pertinent negatives include no fever.  Urinary Frequency  Associated symptoms include frequency. Pertinent negatives include no vomiting.  Patient is in today for an office visit. He is accompanied by his wife.   Patient reports a recent fall. He reports restricted mobility and inability to lift right arm very high. He also reports pain on upper shoulder with a 7/10 pain. Patient's wife states that he has been taking 750 mg of acetaminophen everyday but pain is unchanged.   She reports that he takes 150 MG trazadone in the morning daily.Patient's wife states that he does not need Tramadol refill. She is inquiring on the reaction of tramadol with other medications the patient is currently on. She reports that he takes 150 MG trazadone in the morning daily. Patient's wife is giving him 0.25 Mg of Lazoprame.  He also complains of Urinary frequency.  Patient's wife  states his sore throat symptoms has cleared before he got in to see ENT specialist. Patient wife states that he is comfortable with x-ray and MRI machines.     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   HTN (hypertension) 07/19/2015   Hyperlipidemia    Hypertension    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   Mild, related to stroke history   Nocturia    Renal insufficiency 06/25/2013   S/P carotid endarterectomy    BILATERAL ICA--  PATENT PER DUPLEX  05-19-2012   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   Right calf   Urinary frequency    Vitamin D deficiency     Past Surgical History:  Procedure Laterality Date   APPENDECTOMY  AS CHILD   CARDIOVASCULAR STRESS TEST  03-27-2012  DR CRENSHAW   LOW RISK LEXISCAN STUDY-- PROBABLE NORMAL PERFUSION AND SOFT TISSUE ATTENUATION/  NO ISCHEMIA/ EF 51%   CAROTID ENDARTERECTOMY Bilateral LEFT  11-12-2008  DR GREG HAYES   RIGHT ICA  2006  ([facility])   CYSTOSCOPY W/ RETROGRADES Bilateral 06/22/2021   Procedure: CYSTOSCOPY WITH RETROGRADE PYELOGRAM;  Surgeon: Franchot Gallo, MD;  Location: [facility];  Service: Urology;  Laterality: Bilateral;   CYSTOSCOPY WITH LITHOLAPAXY N/A 02/26/2013   Procedure: CYSTOSCOPY WITH LITHOLAPAXY;  Surgeon: Franchot Gallo, MD;  Location: [facility];  Service: Urology;  Laterality: N/A;   EYE SURGERY  Jan. 2016   cataract  surgery both eyes   INGUINAL HERNIA REPAIR Right 11-08-2006   IR KYPHO EA ADDL LEVEL THORACIC OR LUMBAR  02/12/2021   IR RADIOLOGIST EVAL & MGMT  02/18/2021   MASS EXCISION N/A 03/03/2016   Procedure: EXCISION OF BACK  MASS;  Surgeon: Stark Klein, MD;  Location: [place];  Service: General;  Laterality: N/A;   MOHS SURGERY Left 1/ 2016   Dr Nevada Crane-- Basal cell   PROSTATE SURGERY     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-C N/A 06/22/2021   Procedure: TRANSURETHRAL RESECTION OF BLADDER TUMOR;  Surgeon: Franchot Gallo, MD;  Location: [facility];  Service: Urology;  Laterality:  N/A;   TRANSURETHRAL RESECTION OF PROSTATE N/A 02/26/2013   Procedure: TRANSURETHRAL RESECTION OF THE PROSTATE WITH GYRUS INSTRUMENTS;  Surgeon: Franchot Gallo, MD;  Location: [facility];  Service: Urology;  Laterality: N/A;   TRANSURETHRAL RESECTION OF PROSTATE N/A 06/22/2021   Procedure: TRANSURETHRAL RESECTION OF THE PROSTATE (TURP);  Surgeon: Franchot Gallo, MD;  Location: [facility];  Service: Urology;  Laterality: N/A;    Family History  Problem Relation Age of Onset   Heart disease Mother        CHF   Bipolar disorder Mother    Heart disease Father        CHF    Social History   Socioeconomic History   Marital status: Married    Spouse name: Not on file   Number of children: 2   Years of education: 12   Highest education level: High school graduate  Occupational History    Employer: Retired  Tobacco Use   Smoking status: Former    Packs/day: 2.00    Years: 40.00    Pack years: 80.00    Types: Cigarettes    Quit date: 02/15/2005    Years since quitting: 16.7   Smokeless tobacco: Never  Vaping Use   Vaping Use: Never used  Substance and Sexual Activity   Alcohol use: Yes    Alcohol/week: 0.0 standard drinks    Comment: Occasional   Drug use: No   Sexual activity: Yes    Partners: Female  Other Topics Concern   Not on file  Social History Narrative   Exercise--  Walks dogs everyday      LIves with wife , no stairs in home, caffeine - one cup coffee day, exercise - not much, Right handed, 12th grade, retired      One story home   Social Determinants of Health   Financial Resource Strain: Not on file  Food Insecurity: Not on file  Transportation Needs: Not on file  Physical Activity: Not on file  Stress: Not on file  Social Connections: Not on file  Intimate Partner Violence: Not on file    Outpatient Medications Prior to Visit  Medication Sig Dispense Refill   acetaminophen (TYLENOL) 325 MG tablet Take 1-2 tablets  (325-650 mg total) by mouth every 4 (four) hours as needed for mild pain.     amantadine (SYMMETREL) 100 MG capsule Take 1 capsule (100 mg total) by mouth daily. 30 capsule 0   amLODipine (NORVASC) 10 MG tablet Take 1 tablet (10 mg total) by mouth every morning. 90 tablet 0   carBAMazepine (TEGRETOL) 100 MG/5ML suspension Take 5 mLs (100 mg total) by mouth 3 (three) times daily. 450 mL 12   Cholecalciferol (VITAMIN D) 50 MCG (2000 UT) CAPS Take 2,000 Units by mouth every morning.  clopidogrel (PLAVIX) 75 MG tablet Take 1 tablet (75 mg total) by mouth daily. 30 tablet 0   fluticasone (CUTIVATE) 9.35 % cream 1 application daily as needed (psoriasis).  3   hydrALAZINE (APRESOLINE) 25 MG tablet Take 1 tablet (25 mg total) by mouth every 8 (eight) hours. 90 tablet 0   LORazepam (ATIVAN) 0.5 MG tablet Take 1/2 tablet (0.25 mg total) by mouth daily. 30 tablet 0   LORazepam (ATIVAN) 0.5 MG tablet Take 1/2 tablet by mouth every morning and take 1 tablet by mouth at bedtime 135 tablet 1   LORazepam (ATIVAN) 1 MG tablet Take 1 tablet (1 mg total) by mouth at bedtime. 30 tablet 1   Multiple Vitamins-Minerals (ADULT ONE DAILY GUMMIES) CHEW Chew 1 tablet by mouth every morning.     NONFORMULARY OR COMPOUNDED ITEM Lift chair   dx s/p cva 1 each 0   pantoprazole (PROTONIX) 40 MG tablet Take 1 tablet (40 mg total) by mouth daily. 90 tablet 3   pantoprazole sodium (PROTONIX) 40 mg Place 40 mg into feeding tube daily. 30 packet 3   polyethylene glycol (MIRALAX / GLYCOLAX) 17 g packet Take 17 g by mouth daily. 14 each 0   tamsulosin (FLOMAX) 0.4 MG CAPS capsule Take 1 capsule (0.4 mg total) by mouth daily after supper. 90 capsule 0   traMADol (ULTRAM) 50 MG tablet Take 1 tablet (50 mg total) by mouth every 12 (twelve) hours as needed (back pain). 30 tablet    traZODone (DESYREL) 150 MG tablet Take 1/2 tablet (75 mg total) by mouth at bedtime. 30 tablet 0   triamcinolone cream (KENALOG) 0.1 % Apply 1 application  topically 2 (two) times daily. (Patient taking differently: Apply 1 application. topically 2 (two) times daily as needed (psoriasis).) 30 g 0   aspirin 81 MG EC tablet Take 1 tablet (81 mg total) by mouth daily. Swallow whole. (Patient not taking: Reported on 11/12/2021) 30 tablet 11   ciprofloxacin (CIPRO) 500 MG tablet Take 1 tablet (500 mg total) by mouth every 12 (twelve) hours. (Patient not taking: Reported on 11/12/2021) 13 tablet 0   docusate sodium (COLACE) 100 MG capsule Take 1 capsule (100 mg total) by mouth every 12 (twelve) hours. (Patient not taking: Reported on 11/12/2021) 60 capsule 0   fenofibrate 160 MG tablet Take 1 tablet (160 mg total) by mouth at bedtime. (Patient not taking: Reported on 11/12/2021) 30 tablet 0   Flaxseed, Linseed, (FLAXSEED OIL) 1200 MG CAPS Take 1,200 mg by mouth 2 (two) times daily. (Patient not taking: Reported on 11/12/2021)     lidocaine (LIDODERM) 5 % Place 1 patch onto the skin daily. Remove & Discard patch within 12 hours or as directed by MD (Patient not taking: Reported on 11/12/2021) 15 patch 1   metroNIDAZOLE (FLAGYL) 500 MG tablet Take 1 tablet (500 mg total) by mouth 2 (two) times daily. One po bid x 7 days (Patient not taking: Reported on 11/12/2021) 13 tablet 0   nystatin (MYCOSTATIN) 100000 UNIT/ML suspension Take 5 mLs (500,000 Units total) by mouth 4 (four) times daily. 60 mL 0   QUEtiapine (SEROQUEL) 25 MG tablet Take 1 tablet (25 mg total) by mouth at bedtime. (Patient not taking: Reported on 11/12/2021) 30 tablet 3   scopolamine (TRANSDERM-SCOP) 1 MG/3DAYS Place 1 patch (1.5 mg total) onto the skin every 3 (three) days. (Patient not taking: Reported on 11/12/2021) 10 patch 12   Facility-Administered Medications Prior to Visit  Medication Dose Route Frequency Provider  Last Rate Last Admin   gemcitabine (GEMZAR) chemo syringe for bladder instillation 2,000 mg  2,000 mg Bladder Instillation Once Franchot Gallo, MD        Allergies  Allergen Reactions    Bee Venom Anaphylaxis   Strawberry Extract Hives   Latex Itching   Adhesive [Tape] Other (See Comments)    BLISTER   Statins Other (See Comments)    myalgias    Review of Systems  Constitutional:  Negative for fever and malaise/fatigue.  HENT:  Negative for congestion.   Eyes:  Negative for blurred vision.  Respiratory:  Negative for cough and shortness of breath.   Cardiovascular:  Negative for chest pain, palpitations and leg swelling.  Gastrointestinal:  Negative for vomiting.  Genitourinary:  Positive for frequency.  Musculoskeletal:  Positive for back pain (Radiates from Left Shoulder), falls and joint pain (Left Shoulder).  Skin:  Negative for rash.  Neurological:  Positive for weakness. Negative for loss of consciousness and headaches.      Objective:    Physical Exam Constitutional:      General: He is not in acute distress.    Appearance: Normal appearance. He is not ill-appearing.  HENT:     Head: Normocephalic and atraumatic.  Eyes:     General:        Right eye: No discharge.        Left eye: No discharge.     Extraocular Movements: Extraocular movements intact.     Pupils: Pupils are equal, round, and reactive to light.  Cardiovascular:     Rate and Rhythm: Normal rate and regular rhythm.     Heart sounds: Normal heart sounds. No murmur heard.   No gallop.  Pulmonary:     Effort: Pulmonary effort is normal. No respiratory distress.     Breath sounds: Normal breath sounds. No wheezing or rales.  Musculoskeletal:        General: Tenderness present.     Comments:  (+)- can not raise arm higher than 10-degrees abduction L arm Unable to sleep due to pain    Skin:    General: Skin is warm and dry.  Neurological:     Mental Status: He is alert and oriented to person, place, and time.  Psychiatric:        Judgment: Judgment normal.    BP 132/80 (BP Location: Left Arm, Patient Position: Sitting, Cuff Size: Normal)   Pulse 81   Temp 97.8 F (36.6 C)  (Oral)   Resp 18   Ht '5\' 11"'$  (1.803 m)   SpO2 97%   BMI 27.92 kg/m  Wt Readings from Last 3 Encounters:  10/08/21 200 lb 3.2 oz (90.8 kg)  08/27/21 206 lb (93.4 kg)  08/12/21 201 lb 8 oz (91.4 kg)    Diabetic Foot Exam - Simple   No data filed    Lab Results  Component Value Date   WBC 13.6 (H) 10/02/2021   HGB 11.2 (L) 10/02/2021   HCT 34.4 (L) 10/02/2021   PLT 255 10/02/2021   GLUCOSE 146 (H) 10/02/2021   CHOL 209 (H) 07/27/2021   TRIG 73 07/27/2021   HDL 69 07/27/2021   LDLCALC 125 (H) 07/27/2021   ALT 19 10/02/2021   AST 21 10/02/2021   NA 137 10/02/2021   K 4.2 10/02/2021   CL 110 10/02/2021   CREATININE 1.82 (H) 10/02/2021   BUN 24 (H) 10/02/2021   CO2 17 (L) 10/02/2021   TSH 0.555 07/27/2021   PSA  2.10 12/24/2016   INR 1.0 07/27/2021   HGBA1C 5.3 07/27/2021   MICROALBUR 7.4 (H) 07/31/2020    Lab Results  Component Value Date   TSH 0.555 07/27/2021   Lab Results  Component Value Date   WBC 13.6 (H) 10/02/2021   HGB 11.2 (L) 10/02/2021   HCT 34.4 (L) 10/02/2021   MCV 97.5 10/02/2021   PLT 255 10/02/2021   Lab Results  Component Value Date   NA 137 10/02/2021   K 4.2 10/02/2021   CO2 17 (L) 10/02/2021   GLUCOSE 146 (H) 10/02/2021   BUN 24 (H) 10/02/2021   CREATININE 1.82 (H) 10/02/2021   BILITOT 0.5 10/02/2021   ALKPHOS 107 10/02/2021   AST 21 10/02/2021   ALT 19 10/02/2021   PROT 6.6 10/02/2021   ALBUMIN 3.5 10/02/2021   CALCIUM 8.9 10/02/2021   ANIONGAP 10 10/02/2021   GFR 30.98 (L) 03/16/2021   Lab Results  Component Value Date   CHOL 209 (H) 07/27/2021   Lab Results  Component Value Date   HDL 69 07/27/2021   Lab Results  Component Value Date   LDLCALC 125 (H) 07/27/2021   Lab Results  Component Value Date   TRIG 73 07/27/2021   Lab Results  Component Value Date   CHOLHDL 3.0 07/27/2021   Lab Results  Component Value Date   HGBA1C 5.3 07/27/2021       Assessment & Plan:   Problem List Items Addressed This  Visit   None Visit Diagnoses     Urinary frequency    -  Primary   Relevant Orders   POCT Urinalysis Dipstick (Automated) (Completed)   Urine Culture   Acute pain of left shoulder       Relevant Orders   MR Shoulder Left Wo Contrast   Ambulatory referral to Orthopedic Surgery   Leukocytes in urine       Relevant Orders   Urine Culture       No orders of the defined types were placed in this encounter.   IAnn Held, DO, personally preformed the services described in this documentation.  All medical record entries made by the scribe were at my direction and in my presence.  I have reviewed the chart and discharge instructions (if applicable) and agree that the record reflects my personal performance and is accurate and complete. 11/12/2021   I,Amber Collins,acting as a scribe for Ann Held, DO.,have documented all relevant documentation on the behalf of Ann Held, DO,as directed by  Ann Held, DO while in the presence of Ann Held, DO.    Ann Held, DO

## 2021-11-12 NOTE — Assessment & Plan Note (Signed)
Check MRI shoulder  Refer to ortho

## 2021-11-12 DEATH — deceased

## 2021-11-13 LAB — URINE CULTURE
MICRO NUMBER:: 13470371
Result:: NO GROWTH
SPECIMEN QUALITY:: ADEQUATE

## 2021-11-16 ENCOUNTER — Telehealth: Payer: Self-pay | Admitting: Family Medicine

## 2021-11-16 ENCOUNTER — Ambulatory Visit: Payer: PPO | Attending: Family Medicine | Admitting: Occupational Therapy

## 2021-11-16 ENCOUNTER — Other Ambulatory Visit: Payer: Self-pay | Admitting: Family Medicine

## 2021-11-16 ENCOUNTER — Ambulatory Visit: Payer: PPO | Admitting: Speech Pathology

## 2021-11-16 ENCOUNTER — Encounter: Payer: Self-pay | Admitting: Occupational Therapy

## 2021-11-16 DIAGNOSIS — R41841 Cognitive communication deficit: Secondary | ICD-10-CM | POA: Insufficient documentation

## 2021-11-16 DIAGNOSIS — R262 Difficulty in walking, not elsewhere classified: Secondary | ICD-10-CM | POA: Insufficient documentation

## 2021-11-16 DIAGNOSIS — R278 Other lack of coordination: Secondary | ICD-10-CM | POA: Diagnosis not present

## 2021-11-16 DIAGNOSIS — I69318 Other symptoms and signs involving cognitive functions following cerebral infarction: Secondary | ICD-10-CM | POA: Diagnosis not present

## 2021-11-16 DIAGNOSIS — Z9181 History of falling: Secondary | ICD-10-CM | POA: Diagnosis not present

## 2021-11-16 DIAGNOSIS — R296 Repeated falls: Secondary | ICD-10-CM | POA: Diagnosis not present

## 2021-11-16 DIAGNOSIS — R2689 Other abnormalities of gait and mobility: Secondary | ICD-10-CM | POA: Diagnosis not present

## 2021-11-16 DIAGNOSIS — I69354 Hemiplegia and hemiparesis following cerebral infarction affecting left non-dominant side: Secondary | ICD-10-CM | POA: Insufficient documentation

## 2021-11-16 DIAGNOSIS — M25512 Pain in left shoulder: Secondary | ICD-10-CM | POA: Diagnosis not present

## 2021-11-16 DIAGNOSIS — M6281 Muscle weakness (generalized): Secondary | ICD-10-CM | POA: Insufficient documentation

## 2021-11-16 DIAGNOSIS — R41840 Attention and concentration deficit: Secondary | ICD-10-CM | POA: Insufficient documentation

## 2021-11-16 NOTE — Telephone Encounter (Signed)
Do I need to place a new order?

## 2021-11-16 NOTE — Therapy (Signed)
OUTPATIENT OCCUPATIONAL THERAPY TREATMENT NOTE   Patient Name: William [name] MRN: [id number] DOB:05/10/1941, 81 y.o., male Today's Date: 11/16/2021  PCP: Roma Schanz, DO REFERRING PROVIDER: Carollee Herter, Alferd Apa, DO   OT End of Session - 11/16/21 1456     Visit Number 9    Number of Visits 13    Date for OT Re-Evaluation 12/14/21    Authorization Type [employer]    Authorization Time Period VL: [place]    Progress Note Due on Visit 10    OT Start Time 1450    OT Stop Time 1534    OT Time Calculation (min) 44 min    Activity Tolerance Patient limited by pain   L shoulder pain   Behavior During Therapy Restless;Flat affect;Anxious            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   Mild, related to stroke history   Nocturia    Renal insufficiency 06/25/2013   S/P carotid endarterectomy    BILATERAL ICA--  PATENT PER DUPLEX  05-19-2012   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   Right calf   Urinary frequency    Vitamin D deficiency    Past Surgical History:  Procedure Laterality Date   APPENDECTOMY  AS CHILD   CARDIOVASCULAR STRESS TEST  03-27-2012  DR CRENSHAW   LOW RISK LEXISCAN STUDY-- PROBABLE NORMAL PERFUSION AND SOFT TISSUE ATTENUATION/  NO ISCHEMIA/ EF 51%   CAROTID ENDARTERECTOMY Bilateral LEFT  11-12-2008  DR GREG HAYES   RIGHT  ICA  2006  ([facility])   CYSTOSCOPY W/ RETROGRADES Bilateral 06/22/2021   Procedure: CYSTOSCOPY WITH RETROGRADE PYELOGRAM;  Surgeon: Franchot Gallo, MD;  Location: [facility];  Service: Urology;  Laterality: Bilateral;   CYSTOSCOPY WITH LITHOLAPAXY N/A 02/26/2013   Procedure: CYSTOSCOPY WITH LITHOLAPAXY;  Surgeon: Franchot Gallo, MD;  Location: [facility];  Service: Urology;  Laterality: N/A;   EYE SURGERY  Jan. 2016   cataract surgery both eyes   INGUINAL HERNIA REPAIR Right 11-08-2006   IR KYPHO EA ADDL LEVEL THORACIC OR LUMBAR  02/12/2021   IR RADIOLOGIST EVAL & MGMT  02/18/2021   MASS EXCISION N/A 03/03/2016   Procedure: EXCISION OF BACK  MASS;  Surgeon: Stark Klein, MD;  Location: [place];  Service: General;  Laterality: N/A;   MOHS SURGERY Left 1/ 2016   Dr Nevada Crane-- Basal cell   PROSTATE SURGERY     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-C N/A 06/22/2021   Procedure: TRANSURETHRAL RESECTION OF BLADDER TUMOR;  Surgeon: Franchot Gallo, MD;  Location: [facility];  Service: Urology;  Laterality: N/A;   TRANSURETHRAL RESECTION OF PROSTATE N/A 02/26/2013   Procedure: TRANSURETHRAL RESECTION OF THE PROSTATE WITH GYRUS INSTRUMENTS;  Surgeon: Franchot Gallo, MD;  Location: [facility];  Service: Urology;  Laterality: N/A;   TRANSURETHRAL RESECTION OF PROSTATE N/A 06/22/2021   Procedure: TRANSURETHRAL RESECTION OF THE PROSTATE (TURP);  Surgeon: Franchot Gallo, MD;  Location: [facility];  Service: Urology;  Laterality: N/A;   Patient Active Problem List   Diagnosis Date Noted   Acute pain of left shoulder 11/12/2021   Leukocytes in urine 11/12/2021   Urinary frequency 11/12/2021   Thrush 10/08/2021   Hemiplegia, dominant side S/P CVA (cerebrovascular accident) ([place]) 09/11/2021   Insomnia    Prediabetes    Chronic kidney disease (CKD), stage IV (severe) ([place])    Basal ganglia  infarction ([place]) 07/29/2021   Transaminitis 07/27/2021   UTI (urinary tract infection) 07/27/2021   CVA (cerebral vascular accident) ([place]) 07/27/2021   Fall at home, initial encounter 07/27/2021   Hyperglycemia 07/27/2021   CKD (chronic kidney disease), stage IV ([place]) 07/27/2021   Cholelithiasis 07/27/2021   Hypoxia 07/27/2021   Nausea and vomiting 24/02/7352   Acute metabolic encephalopathy 29/92/4268   Normocytic anemia 07/27/2021   Chronic back pain 07/27/2021   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[place]) 06/22/2021   Closed fracture of first lumbar vertebra with routine healing 02/03/2021   Closed fracture of multiple ribs 11/18/2020   Anxiety 01/29/2020   Leg pain, bilateral 01/29/2020   Ingrown toenail 07/13/2019   Lumbar spondylosis 05/02/2018   Pain in left knee 03/09/2018   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   Trochanteric bursitis of left hip 01/16/2018   Preventative health care 09/26/2017   HTN (hypertension) 07/19/2015   Hyperlipidemia 07/19/2015   Great toe pain 02/11/2014   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   Obesity (BMI 30-39.9) 06/25/2013   Renal insufficiency 06/25/2013   Weakness of left arm 06/25/2013   Sebaceous cyst 03/03/2011   Sprain of lumbar region 07/31/2010   Rib pain, left 08/29/2009   Carotid artery stenosis, asymptomatic, bilateral 05/02/2009   Eczema, atopic 05/31/2008   Vitamin D deficiency 03/01/2008   BPH (benign prostatic hyperplasia) 08/06/2007   Fasting hyperglycemia 12/21/2006   History of right MCA infarct 2006    ONSET DATE: 07/27/21  REFERRING DIAG: R41.4 (ICD-10-CM) - Hemi-inattention I63.9 (ICD-10-CM) - Cerebrovascular accident (CVA), unspecified mechanism ([place])   THERAPY DIAG:  Attention and concentration deficit  Other lack of coordination  Other symptoms and signs involving cognitive functions following cerebral infarction  Other abnormalities of gait and mobility  Repeated  falls   PERTINENT HISTORY: L basal ganglia and corona radiata infarct 07/27/21 (had documented AMS for about 5 days prior w/ hospital work-up including MRI negative and symptoms attributed to a UTI); L lower homonymous quadrantanopia. Other PMH includes R MCA infarct in 2006 w/ L-sided hemiparesis, NCD, carotid artery disease (bilateral), HTN, HLD,CKD3, and hx of bladder cancer  PRECAUTIONS: Fall risk  SUBJECTIVE:   SUBJECTIVE ASSESSMENT: Pt has an appointment tomorrow for his L shoulder w/ Dr. Onnie Graham at [facility]. Pt's spouse also reports he has been attempting more functional tasks at home; e.g., pouring cereal, making coffee, helping get out the dog food  PAIN: Are you having pain? Yes: NPRS scale: 6/10 Pain location: L shoulder Pain description: sore Aggravating factors: movement Relieving factors: ice/heat   OBJECTIVE:   TODAY'S TREATMENT - 11/16/21: Simulated pouring of cereal (dried beans) into standard bowl - pt did well pouring out of tin container w/ pt then attempting to pour beans out of small baggie w/ increased difficulty; OT  provided verbal cues and HOHA for hand placement when pouring Picking up 1" blocks off tabletop w/ R hand and placing into a stack with min cues for slow/controlled movements and stabilization of stack w/ L hand prn; able to complete 2 stacks of 15 w/ mild tremor observed, particularly w/ increased height of stack. OT incorporated 2 lb wrist weight for increased distal stability w/out improvement in success Attempted to place easy-grip pegs into resistance pegboard for LUE awareness and [place]; pt required mod cues and HOHA for incorporation of LUE. Due to persistent difficulty using LUE, OT graded activity down to removing pegs only w/ L hand and then graded down again to separating 2 connected pegs and returning each to container w/ increased success; completed 2 sets of 10 reps for increased bilateral coordination   PATIENT EDUCATION: Continued  condition-specific education, particularly regarding increased participation in functional tasks and safety at home, answering pt's spouse's questions prn Person educated: Patient and Spouse Education method: Explanation Education comprehension: verbalized understanding   GOALS: Goals reviewed with patient? Yes   SHORT TERM GOALS: Target date:    STG   Status:  1 Pt/caregiver will verbalize understanding of visual perceptual compensatory strategies Baseline: Decreased knowledge of visual deficits and strategies On-going  2 Pt will complete symbol cancellation task w/ at least 50% accuracy using visual compensatory strategies to indicate improved attention Baseline: 40% accuracy w/ multiple errors On-going  3 Pt will participate in bilateral coordination activity w/ no more than 3 cues for incorporation of LUE Baseline: L-sided inattention On-going      LONG TERM GOALS: Target date: 11/26/2021   LTG   Status:  1 Pt will be able to thread LUE into shirt sleeve w/ SPV in at least 1/2 trials Baseline: Min A to don/doff shirt sleeve on L side On-going  2 Pt will be able to thread bottoms on BLEs w/ Mod A Baseline: Max A for LB dressing On-going  3 Pt will demonstrate supine-to-sit transition w/ cues from caregiver and incorporation of AE/DME prn Baseline: Mod-Max A w/ bed mobility On-going  4 Pt will be able to use TV remote (or adaptive remote) to complete 1 task (e.g., turn TV on/of, turn volume up/down) w/ SPV Baseline: Difficulty  On-going  5 Pt will increase Barthel Index score by at least 5 to indicate improved level of participation in BADLs Baseline: 60/100 On-going      ASSESSMENT:   CLINICAL IMPRESSION: L shoulder pain s/p fall on 11/06/21 continues to be significantly limiting for participation in therapeutic activities, considering need to address LUE awareness, large amplitude movement, and simulated participation in functional tasks and transfers/transitions. Due  to this, OT focused on increased [place] and tabletop bilateral coordination for L-sided awareness. Pt consistently required multimodal cues for LUE engagement, compensating w/ use of R hand only, though this did slightly improve w/ task repetition and forced use (e.g. pulling pegs apart w/ both hands vs. pulling pegs out of pegboard w/ L hand only). To continue to address engagement in functional activities in line w/ most EBP, Pt's spouse is planning try to bring in various functional activities, using tools and objects used at home, to practice w/ pt in session.   PERFORMANCE DEFICITS in functional skills including ADLs, IADLs, coordination, dexterity, [place], GMC, mobility, balance, and vision, cognitive skills including attention, memory, orientation, problem solving, safety awareness, sequencing, and temperament/personality, and psychosocial skills including environmental adaptation, interpersonal interactions, and routines and behaviors.    IMPAIRMENTS are limiting patient  from ADLs, IADLs, rest and sleep, leisure, and social participation.    COMORBIDITIES may have co-morbidities that affects occupational performance. Patient will benefit from skilled OT to address above impairments and improve overall function.     PLAN: OT FREQUENCY: 2x/week   OT DURATION: 6 weeks   PLANNED INTERVENTIONS: self care/ADL training, therapeutic exercise, therapeutic activity, neuromuscular re-education, balance training, functional mobility training, aquatic therapy, splinting, moist heat, cryotherapy, patient/family education, cognitive remediation/compensation, visual/perceptual remediation/compensation, psychosocial skills training, energy conservation, and DME and/or AE instructions   RECOMMENDED OTHER SERVICES: Currently receiving PT/ST services at this location   CONSULTED AND AGREED WITH PLAN OF CARE: Patient and family member/caregiver   PLAN FOR NEXT SESSION: Large amplitude movements (trunk rotation,  cross body reaching, trunk extension); continue to address functional transitions/transfers (elevated HOB; bed ladder; bed rail), dressing, and retropulsion and L-sided attention/bilateral tasks   Kathrine Cords, MSOT, OTR/L 11/16/21, 5:36 PM

## 2021-11-16 NOTE — Telephone Encounter (Signed)
Pt's wife stated [facility] imaging is no longer assisting or lifting and they will not be able to complete mri. She stated this will need to be done at a [facility] facility instead.

## 2021-11-17 ENCOUNTER — Ambulatory Visit: Payer: PPO | Admitting: Physical Therapy

## 2021-11-17 ENCOUNTER — Encounter: Payer: Self-pay | Admitting: Speech Pathology

## 2021-11-17 ENCOUNTER — Encounter: Payer: Self-pay | Admitting: Occupational Therapy

## 2021-11-17 ENCOUNTER — Ambulatory Visit: Payer: PPO | Admitting: Occupational Therapy

## 2021-11-17 DIAGNOSIS — R278 Other lack of coordination: Secondary | ICD-10-CM

## 2021-11-17 DIAGNOSIS — R41840 Attention and concentration deficit: Secondary | ICD-10-CM

## 2021-11-17 DIAGNOSIS — M25512 Pain in left shoulder: Secondary | ICD-10-CM | POA: Diagnosis not present

## 2021-11-17 DIAGNOSIS — R2689 Other abnormalities of gait and mobility: Secondary | ICD-10-CM

## 2021-11-17 DIAGNOSIS — I69318 Other symptoms and signs involving cognitive functions following cerebral infarction: Secondary | ICD-10-CM

## 2021-11-17 MED ORDER — AMANTADINE HCL 100 MG PO CAPS
100.0000 mg | ORAL_CAPSULE | Freq: Every day | ORAL | 0 refills | Status: DC
Start: 2021-11-17 — End: 2021-12-12
  Filled 2021-11-17: qty 30, 30d supply, fill #0
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
## 2021-11-17 NOTE — Therapy (Signed)
OUTPATIENT OCCUPATIONAL THERAPY TREATMENT NOTE & PROGRESS NOTE   Patient Name: William [name] MRN: [id number] DOB:1940-07-26, 81 y.o., male Today's Date: 11/17/2021  PCP: Roma Schanz, DO REFERRING PROVIDER: Carollee Herter, Alferd Apa, DO   OT End of Session - 11/17/21 1323     Visit Number 10    Number of Visits 13    Date for OT Re-Evaluation 12/14/21    Authorization Type [employer]    Authorization Time Period VL: [place]    Progress Note Due on Visit 10    OT Start Time 1317    OT Stop Time 1400    OT Time Calculation (min) 43 min    Activity Tolerance Patient limited by pain   L shoulder pain   Behavior During Therapy Restless;Flat affect;Anxious            Occupational Therapy Progress Note  Dates of Reporting Period: 10/15/21 to 11/17/21  Objective Reports of Subjective Statement: 6/10 pain of L shoulder via VAS across recent sessions  Objective Measurements: Barthel Index, symbol cancellation, levels of assistance during functional activities  Goal Update: Pt has met 1/3 STGs and is working toward all [place]: Continue to address coordination, balance, GM/FM control, cognition, safety awareness, compensatory strategies/AE prn, inattention and visual-perception  Reason Skilled Services are Required: Deficits in executive functioning, functional use of LUE, imbalance, as well as weakness and decreased coordination continue to be significantly limiting to participation and safety in BADLs, IADLs, and functional mobility, which also contributes to caregiver strain   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   Mohs surgery jan 2016    Bladder stone    BPH (benign prostatic hyperplasia) 08/06/2007   Carotid artery occlusion    Chronic kidney disease 2014   Stage III   Closed fracture of fifth metacarpal bone 05/15/2015   Eczema    Fasting hyperglycemia 12/21/2006   GERD (gastroesophageal  reflux disease)    History of carotid artery stenosis    S/P BILATERAL CEA   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   HTN (hypertension) 07/19/2015   Hyperlipidemia    Hypertension    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   Mild, related to stroke history   Nocturia    Renal insufficiency 06/25/2013   S/P carotid endarterectomy    BILATERAL ICA--  PATENT PER DUPLEX  05-19-2012   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   Right calf   Urinary frequency    Vitamin D deficiency    Past Surgical History:  Procedure Laterality Date   APPENDECTOMY  AS CHILD   CARDIOVASCULAR STRESS TEST  03-27-2012  DR CRENSHAW   LOW RISK LEXISCAN STUDY-- PROBABLE NORMAL PERFUSION AND SOFT TISSUE ATTENUATION/  NO ISCHEMIA/ EF 51%   CAROTID ENDARTERECTOMY Bilateral LEFT  11-12-2008  DR GREG HAYES   RIGHT ICA  2006  ([facility])   CYSTOSCOPY W/ RETROGRADES Bilateral 06/22/2021   Procedure: CYSTOSCOPY WITH RETROGRADE PYELOGRAM;  Surgeon: Franchot Gallo, MD;  Location: [facility];  Service: Urology;  Laterality: Bilateral;   CYSTOSCOPY WITH LITHOLAPAXY N/A 02/26/2013   Procedure: CYSTOSCOPY WITH LITHOLAPAXY;  Surgeon: Franchot Gallo, MD;  Location: [facility];  Service: Urology;  Laterality: N/A;   EYE SURGERY  Jan. 2016   cataract surgery both eyes   INGUINAL HERNIA REPAIR Right 11-08-2006   IR KYPHO EA ADDL  LEVEL THORACIC OR LUMBAR  02/12/2021   IR RADIOLOGIST EVAL & MGMT  02/18/2021   MASS EXCISION N/A 03/03/2016   Procedure: EXCISION OF BACK  MASS;  Surgeon: Stark Klein, MD;  Location: [facility];  Service: General;  Laterality: N/A;   MOHS SURGERY Left 1/ 2016   Dr Nevada Crane-- Basal cell   PROSTATE SURGERY     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-C N/A 06/22/2021   Procedure: TRANSURETHRAL RESECTION OF BLADDER TUMOR;  Surgeon: Franchot Gallo, MD;  Location: [facility];  Service:  Urology;  Laterality: N/A;   TRANSURETHRAL RESECTION OF PROSTATE N/A 02/26/2013   Procedure: TRANSURETHRAL RESECTION OF THE PROSTATE WITH GYRUS INSTRUMENTS;  Surgeon: Franchot Gallo, MD;  Location: [facility];  Service: Urology;  Laterality: N/A;   TRANSURETHRAL RESECTION OF PROSTATE N/A 06/22/2021   Procedure: TRANSURETHRAL RESECTION OF THE PROSTATE (TURP);  Surgeon: Franchot Gallo, MD;  Location: [facility];  Service: Urology;  Laterality: N/A;   Patient Active Problem List   Diagnosis Date Noted   Acute pain of left shoulder 11/12/2021   Leukocytes in urine 11/12/2021   Urinary frequency 11/12/2021   Thrush 10/08/2021   Hemiplegia, dominant side S/P CVA (cerebrovascular accident) ([place]) 09/11/2021   Insomnia    Prediabetes    Chronic kidney disease (CKD), stage IV (severe) ([place])    Basal ganglia infarction ([place]) 07/29/2021   Transaminitis 07/27/2021   UTI (urinary tract infection) 07/27/2021   CVA (cerebral vascular accident) ([place]) 07/27/2021   Fall at home, initial encounter 07/27/2021   Hyperglycemia 07/27/2021   CKD (chronic kidney disease), stage IV ([place]) 07/27/2021   Cholelithiasis 07/27/2021   Hypoxia 07/27/2021   Nausea and vomiting 14/43/1540   Acute metabolic encephalopathy 08/67/6195   Normocytic anemia 07/27/2021   Chronic back pain 07/27/2021   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[place]) 06/22/2021   Closed fracture of first lumbar vertebra with routine healing 02/03/2021   Closed fracture of multiple ribs 11/18/2020   Anxiety 01/29/2020   Leg pain, bilateral 01/29/2020   Ingrown toenail 07/13/2019   Lumbar spondylosis 05/02/2018   Pain in left knee 03/09/2018   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   Trochanteric bursitis of left hip 01/16/2018   Preventative health care 09/26/2017   HTN (hypertension) 07/19/2015   Hyperlipidemia 07/19/2015   Great toe pain 02/11/2014   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   Obesity (BMI 30-39.9) 06/25/2013   Renal insufficiency 06/25/2013   Weakness of left arm 06/25/2013   Sebaceous cyst 03/03/2011   Sprain of lumbar region 07/31/2010   Rib pain, left 08/29/2009   Carotid artery stenosis, asymptomatic, bilateral 05/02/2009   Eczema, atopic 05/31/2008   Vitamin D deficiency 03/01/2008   BPH (benign prostatic hyperplasia) 08/06/2007   Fasting hyperglycemia 12/21/2006   History of right MCA infarct 2006    ONSET DATE: 07/27/21  REFERRING DIAG: R41.4 (ICD-10-CM) - Hemi-inattention I63.9 (ICD-10-CM) - Cerebrovascular accident (CVA), unspecified mechanism ([place])   THERAPY DIAG:  Attention and concentration deficit  Other symptoms and signs involving cognitive functions following cerebral infarction  Other lack of coordination  Other abnormalities of gait and mobility  PERTINENT HISTORY: L basal ganglia and corona radiata infarct 07/27/21 (had documented AMS for about 5 days prior w/ hospital work-up including MRI negative and symptoms attributed to a UTI); L lower homonymous quadrantanopia. Other PMH includes R MCA infarct in 2006 w/ L-sided hemiparesis, NCD, carotid artery disease (bilateral), HTN, HLD,CKD3, and  hx of bladder cancer  PRECAUTIONS: Fall risk  SUBJECTIVE:   SUBJECTIVE ASSESSMENT: Pt continues to endorse significant L shoulder pain, particularly w/ movement  PAIN: Are you having pain? Yes: NPRS scale: 6/10 Pain location: L shoulder Pain description: sore Aggravating factors: movement Relieving factors: ice/heat   OBJECTIVE:   TODAY'S TREATMENT - 11/17/21: Folding towel in a 3-step sequence; OT provided breakdown of task, repetition, verbal cues, and blocking for errorless learning. Previously used high contrast visual cues also incorporated for sequencing and additional modeling w/ positive results; success improved w/ repetition. Practiced pouring of cereal w/ box, bowl, and sugar brought from home; pt able to take  bag out of box and pour "milk" into bowl w/out difficulty, requiring verbal cues and assist to fully open bag before pouring, assist for repositioning of hand placement for efficiency and to decrease opportunity for spills, and verbal/gestural cues to terminate scooping and adding of sugar. OT problem-solved w/ pt's spouse to determine potential compensatory method of pouring sugar to increase success at home Simulated brushing of teeth w/ pt's own toothbrush, toothpaste, and rinse cup: pt required Mod A and cues for sequencing and to move to different areas of the mouth, but was able to put toothpaste on toothbrush and rinse toothbrush after brushing w/ Mod I. Increased difficulty w/ sequencing and perseveration during rinse and spit w/ a cup; OT recommended pt's spouse monitor to ensure pt is not swallowing toothpaste due to difficulty observed w/ spitting   PATIENT EDUCATION: Continued condition-specific education, particularly regarding increased participation in functional tasks and safety at home, answering pt's spouse's questions prn Person educated: Patient and Spouse Education method: Explanation Education comprehension: verbalized understanding   GOALS: Goals reviewed with patient? Yes   SHORT TERM GOALS: Target date: 10/15/2021   STG   Status:  1 Pt/caregiver will verbalize understanding of visual perceptual compensatory strategies Baseline: Decreased knowledge of visual deficits and strategies On-going  2 Pt will complete symbol cancellation task w/ at least 50% accuracy using visual compensatory strategies to indicate improved attention Baseline: 40% accuracy w/ multiple errors Achieved - 11/17/21  3 Pt will participate in bilateral coordination activity w/ no more than 3 cues for incorporation of LUE Baseline: L-sided inattention On-going      LONG TERM GOALS: Target date: 11/26/2021   LTG   Status:  1 Pt will be able to thread LUE into shirt sleeve w/ SPV in at least 1/2  trials Baseline: Min A to don/doff shirt sleeve on L side On-going  2 Pt will be able to thread bottoms on BLEs w/ Mod A Baseline: Max A for LB dressing On-going  3 Pt will demonstrate supine-to-sit transition w/ cues from caregiver and incorporation of AE/DME prn Baseline: Mod-Max A w/ bed mobility On-going  4 Pt will be able to use TV remote (or adaptive remote) to complete 1 task (e.g., turn TV on/of, turn volume up/down) w/ SPV Baseline: Difficulty  On-going  5 Pt will increase Barthel Index score by at least 5 to indicate improved level of participation in BADLs Baseline: 60/100 On-going      ASSESSMENT:   CLINICAL IMPRESSION: L shoulder pain s/p fall on 11/06/21 continues to be limiting to addressing of LUE awareness and somatosensory input, large amplitude movement, and simulated functional transfers and transitions. However, pt's spouse brought in simple functional activities OT was able to incorporate in session to address safety and participation during tasks while simulating context and tools used at home as closely as able.  Across activities, pt demonstrated difficulty w/ sequencing, termination of task, and problem-solving, but was largely able to complete tasks safely w/ SPV. Pt will continue to benefit from skilled OT services to address deficits mentioned in progress note section above in order to improve participation in ADLs and decrease caregiver strain.   PERFORMANCE DEFICITS in functional skills including ADLs, IADLs, coordination, dexterity, [place], GMC, mobility, balance, and vision, cognitive skills including attention, memory, orientation, problem solving, safety awareness, sequencing, and temperament/personality, and psychosocial skills including environmental adaptation, interpersonal interactions, and routines and behaviors.    IMPAIRMENTS are limiting patient from ADLs, IADLs, rest and sleep, leisure, and social participation.    COMORBIDITIES may have co-morbidities  that affects occupational performance. Patient will benefit from skilled OT to address above impairments and improve overall function.     PLAN: OT FREQUENCY: 2x/week   OT DURATION: 6 weeks   PLANNED INTERVENTIONS: self care/ADL training, therapeutic exercise, therapeutic activity, neuromuscular re-education, balance training, functional mobility training, aquatic therapy, splinting, moist heat, cryotherapy, patient/family education, cognitive remediation/compensation, visual/perceptual remediation/compensation, psychosocial skills training, energy conservation, and DME and/or AE instructions   RECOMMENDED OTHER SERVICES: Currently receiving PT/ST services at this location   CONSULTED AND AGREED WITH PLAN OF CARE: Patient and family member/caregiver   PLAN FOR NEXT SESSION: Large amplitude movements (trunk rotation, cross body reaching, trunk extension); continue to address functional transitions/transfers (elevated HOB; bed ladder; bed rail), dressing, and retropulsion and L-sided attention/bilateral tasks   Kathrine Cords, MSOT, OTR/L 11/17/21, 2:21 PM

## 2021-11-18 ENCOUNTER — Other Ambulatory Visit (HOSPITAL_BASED_OUTPATIENT_CLINIC_OR_DEPARTMENT_OTHER): Payer: Self-pay

## 2021-11-18 ENCOUNTER — Ambulatory Visit: Payer: PPO | Admitting: Physical Therapy

## 2021-11-18 ENCOUNTER — Encounter: Payer: Self-pay | Admitting: Physical Therapy

## 2021-11-18 DIAGNOSIS — G20 Parkinson's disease: Secondary | ICD-10-CM | POA: Diagnosis not present

## 2021-11-18 DIAGNOSIS — N184 Chronic kidney disease, stage 4 (severe): Secondary | ICD-10-CM | POA: Diagnosis not present

## 2021-11-18 DIAGNOSIS — R296 Repeated falls: Secondary | ICD-10-CM

## 2021-11-18 DIAGNOSIS — R262 Difficulty in walking, not elsewhere classified: Secondary | ICD-10-CM

## 2021-11-18 DIAGNOSIS — R41840 Attention and concentration deficit: Secondary | ICD-10-CM | POA: Diagnosis not present

## 2021-11-18 DIAGNOSIS — M6281 Muscle weakness (generalized): Secondary | ICD-10-CM

## 2021-11-18 DIAGNOSIS — I6381 Other cerebral infarction due to occlusion or stenosis of small artery: Secondary | ICD-10-CM | POA: Diagnosis not present

## 2021-11-18 DIAGNOSIS — S2249XA Multiple fractures of ribs, unspecified side, initial encounter for closed fracture: Secondary | ICD-10-CM | POA: Diagnosis not present

## 2021-11-18 DIAGNOSIS — G9341 Metabolic encephalopathy: Secondary | ICD-10-CM | POA: Diagnosis not present

## 2021-11-18 DIAGNOSIS — I69354 Hemiplegia and hemiparesis following cerebral infarction affecting left non-dominant side: Secondary | ICD-10-CM

## 2021-11-18 DIAGNOSIS — R2689 Other abnormalities of gait and mobility: Secondary | ICD-10-CM

## 2021-11-18 DIAGNOSIS — M47816 Spondylosis without myelopathy or radiculopathy, lumbar region: Secondary | ICD-10-CM | POA: Diagnosis not present

## 2021-11-18 DIAGNOSIS — M25512 Pain in left shoulder: Secondary | ICD-10-CM

## 2021-11-18 NOTE — Therapy (Signed)
[facility] [place] [place], [name], [address] Phone: [phone]   Fax:  [phone]  Physical Therapy Evaluation  Patient Details  Name: William [name] MRN: [id number] Date of Birth: 08/08/1940 Referring Provider (PT): Supple   Encounter Date: 11/18/2021   PT End of Session - 11/18/21 1746     Visit Number 12    Date for PT Re-Evaluation 12/31/21    PT Start Time 3329    PT Stop Time 1830    PT Time Calculation (min) 46 min    Equipment Utilized During Treatment Gait belt    Activity Tolerance Patient tolerated treatment well;No increased pain    Behavior During Therapy Restless;Flat affect;Anxious             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   Mild, related to stroke history   Nocturia    Renal insufficiency 06/25/2013   S/P carotid endarterectomy    BILATERAL ICA--  PATENT PER DUPLEX  05-19-2012   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   Right calf   Urinary frequency    Vitamin D deficiency     Past Surgical History:  Procedure Laterality Date   APPENDECTOMY  AS CHILD   CARDIOVASCULAR STRESS TEST  03-27-2012  DR CRENSHAW   LOW RISK LEXISCAN STUDY-- PROBABLE NORMAL PERFUSION AND SOFT TISSUE ATTENUATION/  NO ISCHEMIA/ EF 51%   CAROTID ENDARTERECTOMY Bilateral LEFT  11-12-2008  DR  GREG HAYES   RIGHT ICA  2006  ([facility])   CYSTOSCOPY W/ RETROGRADES Bilateral 06/22/2021   Procedure: CYSTOSCOPY WITH RETROGRADE PYELOGRAM;  Surgeon: Franchot Gallo, MD;  Location: [facility];  Service: Urology;  Laterality: Bilateral;   CYSTOSCOPY WITH LITHOLAPAXY N/A 02/26/2013   Procedure: CYSTOSCOPY WITH LITHOLAPAXY;  Surgeon: Franchot Gallo, MD;  Location: [facility];  Service: Urology;  Laterality: N/A;   EYE SURGERY  Jan. 2016   cataract surgery both eyes   INGUINAL HERNIA REPAIR Right 11-08-2006   IR KYPHO EA ADDL LEVEL THORACIC OR LUMBAR  02/12/2021   IR RADIOLOGIST EVAL & MGMT  02/18/2021   MASS EXCISION N/A 03/03/2016   Procedure: EXCISION OF BACK  MASS;  Surgeon: Stark Klein, MD;  Location: [place];  Service: General;  Laterality: N/A;   MOHS SURGERY Left 1/ 2016   Dr Nevada Crane-- Basal cell   PROSTATE SURGERY     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-C N/A 06/22/2021   Procedure: TRANSURETHRAL RESECTION OF BLADDER TUMOR;  Surgeon: Franchot Gallo, MD;  Location: [facility];  Service: Urology;  Laterality: N/A;   TRANSURETHRAL RESECTION OF PROSTATE N/A 02/26/2013   Procedure: TRANSURETHRAL RESECTION OF THE PROSTATE WITH GYRUS INSTRUMENTS;  Surgeon: Franchot Gallo, MD;  Location: [place]  [place];  Service: Urology;  Laterality: N/A;   TRANSURETHRAL RESECTION OF PROSTATE N/A 06/22/2021   Procedure: TRANSURETHRAL RESECTION OF THE PROSTATE (TURP);  Surgeon: Franchot Gallo, MD;  Location: [facility];  Service: Urology;  Laterality: N/A;    There were no vitals filed for this visit.    Subjective Assessment - 11/18/21 1747     Subjective Had a fall 11/07/21, landing on the left shoulder.  Urgent care at [facility], x-rays negative.  Saw Orthopod has new order for left shoulder contusiion, has brusing anterior, laterally and posteriorly.  Reports that tramadol and tylenol did not  help.  They report some fatigue    Currently in Pain? Yes    Pain Score 7     Pain Location Shoulder    Pain Orientation Left;Upper;Posterior    Pain Descriptors / Indicators Sharp    Pain Type Acute pain    Pain Radiating Towards reports some numbness in the left lateral arm    Pain Onset 1 to 4 weeks ago    Pain Frequency Constant    Aggravating Factors  falling and landing on the shoulder , movements reaching pain up to 9-10/10    Pain Relieving Factors ice helps, heat helps    Effect of Pain on Daily Activities limites everything                [facility] PT Assessment - 11/18/21 0001       Assessment   Medical Diagnosis left shoulder pain    Referring Provider (PT) Supple    Onset Date/Surgical Date 11/07/21    Hand Dominance Right      AROM   Overall AROM Comments wife reports that before he fell he could dress with a tshirt, now she helps much more and he has a lot of pain.    AROM Assessment Site Shoulder    Right/Left Shoulder Left    Left Shoulder Flexion 45 Degrees    Left Shoulder ABduction 40 Degrees    Left Shoulder Internal Rotation 37 Degrees    Left Shoulder External Rotation 10 Degrees      Palpation   Palpation comment very tender and tight in the left upper trap, the left shoulder, rhomboid      Special Tests    Special Tests Rotator Cuff Impingement    Rotator Cuff Impingment tests Michel Bickers test;Empty Can test      Hawkins-Kennedy test   Findings Positive    Side Left      Empty Can test   Findings Positive    Side Left      Transfers   Comments now wife reports that she has to do most of the work to get in and out of bed mod / max A                        Objective measurements completed on examination: See above findings.       [facility] Adult PT Treatment/Exercise - 11/18/21 0001       Moist Heat Therapy   Number Minutes Moist Heat 10 Minutes    Moist Heat Location Shoulder      Electrical Stimulation    Electrical Stimulation Location left shoulder    Electrical Stimulation Action IFC    Electrical Stimulation Parameters sitting    Electrical Stimulation Goals Pain  PT Education - 11/18/21 1816     Education Details wife and patient thigh slides and AAROM for flexion, talked about the fear of gaurding the shoulder and causing neck and upper trap pain and the fact that this is his non dominant hand and his hand that had issues prior due to hemiplegia and the worry about possible loss of motion and function if he does not move it    Person(s) Educated Patient;Spouse    Methods Explanation;Demonstration;Tactile cues;Verbal cues    Comprehension Verbalized understanding              PT Short Term Goals - 11/10/21 1415       PT SHORT TERM GOAL #1   Title independent with initial HEP    Time 2    Period Weeks    Status On-going               PT Long Term Goals - 11/18/21 1833       Additional Long Term Goals   Additional Long Term Goals Yes      PT LONG TERM GOAL #6   Title decrease left shoulder pain 50%    Time 12    Period Weeks    Status New      PT LONG TERM GOAL #7   Title able to get up from supine and from sitting without help    Time 12    Period Weeks    Status New                    Plan - 11/18/21 1818     Clinical Impression Statement Has new order for left shoulder evaluation, added this, he is very limited this is his non dominant hand and the side affected most by the stroke, he is very tedner, c/o high rating of pain and does not allow PROM and does not move the arm much at all, x-rays negative, all tests increased pain.  He elected to have heat as he told me and his wife it feels better than ice.  He has a lot of bruising anterior, lateral and posterior, he is having difficulty with dressing and transfers, wife reports that she is having to do most things now for him without much help from him     Stability/Clinical Decision Making Evolving/Moderate complexity    Clinical Decision Making Low    Rehab Potential Fair    PT Frequency 2x / week    PT Treatment/Interventions ADLs/Self Care Home Management;Electrical Stimulation;Moist Heat;Gait training;Neuromuscular re-education;Balance training;Therapeutic exercise;Therapeutic activities;Functional mobility training;Stair training;Patient/family education;Manual techniques;Vasopneumatic Device;Ultrasound    PT Next Visit Plan start treatment for left shoulder from Dr Onnie Graham, continue working on balance, function and gait    Consulted and Agree with Plan of Care Patient;Family member/caregiver    Family Member Consulted wife [name]             Patient will benefit from skilled therapeutic intervention in order to improve the following deficits and impairments:  Abnormal gait, Decreased coordination, Decreased range of motion, Difficulty walking, Increased muscle spasms, Cardiopulmonary status limiting activity, Decreased endurance, Decreased activity tolerance, Pain, Impaired flexibility, Decreased balance, Decreased mobility, Decreased strength, Postural dysfunction, Increased edema  Visit Diagnosis: Other abnormalities of gait and mobility  Repeated falls  Difficulty in walking, not elsewhere classified  Hemiplegia and hemiparesis following cerebral infarction affecting left non-dominant side (HCC)  Muscle weakness (generalized)  Acute pain of left shoulder     Problem List Patient  Active Problem List   Diagnosis Date Noted   Acute pain of left shoulder 11/12/2021   Leukocytes in urine 11/12/2021   Urinary frequency 11/12/2021   Thrush 10/08/2021   Hemiplegia, dominant side S/P CVA (cerebrovascular accident) ([place]) 09/11/2021   Insomnia    Prediabetes    Chronic kidney disease (CKD), stage IV (severe) ([place])    Basal ganglia infarction ([place]) 07/29/2021   Transaminitis 07/27/2021   UTI (urinary tract infection)  07/27/2021   CVA (cerebral vascular accident) ([place]) 07/27/2021   Fall at home, initial encounter 07/27/2021   Hyperglycemia 07/27/2021   CKD (chronic kidney disease), stage IV ([place]) 07/27/2021   Cholelithiasis 07/27/2021   Hypoxia 07/27/2021   Nausea and vomiting 65/78/4696   Acute metabolic encephalopathy 29/52/8413   Normocytic anemia 07/27/2021   Chronic back pain 07/27/2021   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[place]) 06/22/2021   Closed fracture of first lumbar vertebra with routine healing 02/03/2021   Closed fracture of multiple ribs 11/18/2020   Anxiety 01/29/2020   Leg pain, bilateral 01/29/2020   Ingrown toenail 07/13/2019   Lumbar spondylosis 05/02/2018   Pain in left knee 03/09/2018   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   Trochanteric bursitis of left hip 01/16/2018   Preventative health care 09/26/2017   HTN (hypertension) 07/19/2015   Hyperlipidemia 07/19/2015   Great toe pain 02/11/2014   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 [place], [name], [address] Phone: [phone]   Fax:  [phone]  Name: William [name] MRN: [id number] Date of Birth: 09/02/1940

## 2021-11-19 ENCOUNTER — Encounter: Payer: Self-pay | Admitting: Physical Therapy

## 2021-11-19 ENCOUNTER — Ambulatory Visit: Payer: PPO | Admitting: Physical Therapy

## 2021-11-19 ENCOUNTER — Ambulatory Visit: Payer: PPO | Admitting: Speech Pathology

## 2021-11-19 ENCOUNTER — Encounter: Payer: Self-pay | Admitting: Speech Pathology

## 2021-11-19 DIAGNOSIS — M6281 Muscle weakness (generalized): Secondary | ICD-10-CM

## 2021-11-19 DIAGNOSIS — G20 Parkinson's disease: Secondary | ICD-10-CM | POA: Diagnosis not present

## 2021-11-19 DIAGNOSIS — R41840 Attention and concentration deficit: Secondary | ICD-10-CM | POA: Diagnosis not present

## 2021-11-19 DIAGNOSIS — G9341 Metabolic encephalopathy: Secondary | ICD-10-CM | POA: Diagnosis not present

## 2021-11-19 DIAGNOSIS — R296 Repeated falls: Secondary | ICD-10-CM

## 2021-11-19 DIAGNOSIS — R262 Difficulty in walking, not elsewhere classified: Secondary | ICD-10-CM

## 2021-11-19 DIAGNOSIS — N184 Chronic kidney disease, stage 4 (severe): Secondary | ICD-10-CM | POA: Diagnosis not present

## 2021-11-19 DIAGNOSIS — R41841 Cognitive communication deficit: Secondary | ICD-10-CM

## 2021-11-19 DIAGNOSIS — S2249XA Multiple fractures of ribs, unspecified side, initial encounter for closed fracture: Secondary | ICD-10-CM | POA: Diagnosis not present

## 2021-11-19 DIAGNOSIS — I69354 Hemiplegia and hemiparesis following cerebral infarction affecting left non-dominant side: Secondary | ICD-10-CM

## 2021-11-19 DIAGNOSIS — M25512 Pain in left shoulder: Secondary | ICD-10-CM

## 2021-11-19 DIAGNOSIS — R2689 Other abnormalities of gait and mobility: Secondary | ICD-10-CM

## 2021-11-19 DIAGNOSIS — I6381 Other cerebral infarction due to occlusion or stenosis of small artery: Secondary | ICD-10-CM | POA: Diagnosis not present

## 2021-11-19 DIAGNOSIS — M47816 Spondylosis without myelopathy or radiculopathy, lumbar region: Secondary | ICD-10-CM | POA: Diagnosis not present
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
## 2021-11-19 NOTE — Therapy (Signed)
[place]. [place], [name], [address] Phone: [phone]   Fax:  [phone]  Physical Therapy Treatment  Patient Details  Name: William [name] MRN: [id number] Date of Birth: 1940/11/26 Referring Provider (PT): Supple   Encounter Date: 11/19/2021   PT End of Session - 11/19/21 1617     Visit Number 13    Date for PT Re-Evaluation 12/31/21    PT Start Time 5400   working with speech therapy   PT Stop Time 1612    PT Time Calculation (min) 30 min    Activity Tolerance Patient tolerated treatment well;Patient limited by pain    Behavior During Therapy Restless;Flat affect;Anxious             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   Mild, related to stroke history   Nocturia    Renal insufficiency 06/25/2013   S/P carotid endarterectomy    BILATERAL ICA--  PATENT PER DUPLEX  05-19-2012   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   Right calf   Urinary frequency    Vitamin D deficiency     Past Surgical History:  Procedure Laterality Date   APPENDECTOMY  AS CHILD   CARDIOVASCULAR STRESS TEST  03-27-2012  DR CRENSHAW   LOW RISK LEXISCAN STUDY-- PROBABLE NORMAL PERFUSION AND SOFT TISSUE ATTENUATION/  NO ISCHEMIA/ EF 51%   CAROTID ENDARTERECTOMY Bilateral LEFT  11-12-2008  DR GREG HAYES    RIGHT ICA  2006  ([facility])   CYSTOSCOPY W/ RETROGRADES Bilateral 06/22/2021   Procedure: CYSTOSCOPY WITH RETROGRADE PYELOGRAM;  Surgeon: Franchot Gallo, MD;  Location: [facility];  Service: Urology;  Laterality: Bilateral;   CYSTOSCOPY WITH LITHOLAPAXY N/A 02/26/2013   Procedure: CYSTOSCOPY WITH LITHOLAPAXY;  Surgeon: Franchot Gallo, MD;  Location: [facility];  Service: Urology;  Laterality: N/A;   EYE SURGERY  Jan. 2016   cataract surgery both eyes   INGUINAL HERNIA REPAIR Right 11-08-2006   IR KYPHO EA ADDL LEVEL THORACIC OR LUMBAR  02/12/2021   IR RADIOLOGIST EVAL & MGMT  02/18/2021   MASS EXCISION N/A 03/03/2016   Procedure: EXCISION OF BACK  MASS;  Surgeon: Stark Klein, MD;  Location: [facility];  Service: General;  Laterality: N/A;   MOHS SURGERY Left 1/ 2016   Dr Nevada Crane-- Basal cell   PROSTATE SURGERY     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-C N/A 06/22/2021   Procedure: TRANSURETHRAL RESECTION OF BLADDER TUMOR;  Surgeon: Franchot Gallo, MD;  Location: [facility];  Service: Urology;  Laterality: N/A;   TRANSURETHRAL RESECTION OF PROSTATE N/A 02/26/2013   Procedure: TRANSURETHRAL RESECTION OF THE PROSTATE WITH GYRUS INSTRUMENTS;  Surgeon: Franchot Gallo, MD;  Location: [facility];  Service: Urology;  Laterality: N/A;   TRANSURETHRAL RESECTION OF PROSTATE N/A 06/22/2021   Procedure: TRANSURETHRAL RESECTION OF THE PROSTATE (TURP);  Surgeon: Franchot Gallo, MD;  Location: [facility];  Service: Urology;  Laterality: N/A;    There were no vitals filed for this visit.   Subjective Assessment - 11/19/21 1541     Subjective My shoulder is still really sore. My pain is terrible in the shoulder, heat and ice make it feel better.    Currently in Pain? Yes    Pain Score 6     Pain Location Shoulder    Pain Orientation Left;Upper    Pain Descriptors / Indicators Aching;Dull     Pain Type Acute pain                               [facility] Adult PT Treatment/Exercise - 11/19/21 0001       Therapeutic Activites    Therapeutic Activities Other Therapeutic Activities - various bed mobility techniques to improve bed mobility at home and reduce shoulder pain      Lumbar Exercises: Seated   Other Seated Lumbar Exercises active ROM in limitied flexion and ABD ROM using reacher to tolerance   after biofreeze     Lumbar Exercises: Supine   Other Supine Lumbar Exercises stretching in L shoulder flexion, ABD, and ER to tolerance                     PT Education - 11/19/21 1617     Education Details bed mobiity techinques of varoius sorts, biofreeze safety- do not use heat or ice in combination with it and wash arm well iwth soap and water prior to putting heat/ice on later    Person(s) Educated Patient;Spouse    Methods Explanation    Comprehension Verbalized understanding              PT Short Term Goals - 11/10/21 1415       PT SHORT TERM GOAL #1   Title independent with initial HEP    Time 2    Period Weeks    Status On-going               PT Long Term Goals - 11/18/21 1833       Additional Long Term Goals   Additional Long Term Goals Yes      PT LONG TERM GOAL #6   Title decrease left shoulder pain 50%    Time 12    Period Weeks    Status New      PT LONG TERM GOAL #7   Title able to get up from supine and from sitting without help    Time 12    Period Weeks    Status New                   Plan - 11/19/21 1617     Clinical Impression Statement William [name] arrives doing OK, shoulder still hurting him. We tried some Biofreeze and then had him work on some AROM using the reacher today. Afterwards we tried working on bed mobility since this has been really hard for them since his fall at home. He was much more fatigued than his typical today- didn't have as long of a nap as usual and perseverated on  taking a nap. Tried a little shoulder stretching as well, able to tolerate better with biofreeze on.  Stability/Clinical Decision Making Evolving/Moderate complexity    Clinical Decision Making Low    Rehab Potential Fair    PT Frequency 2x / week    PT Duration Other (comment)    PT Treatment/Interventions ADLs/Self Care Home Management;Electrical Stimulation;Moist Heat;Gait training;Neuromuscular re-education;Balance training;Therapeutic exercise;Therapeutic activities;Functional mobility training;Stair training;Patient/family education;Manual techniques;Vasopneumatic Device;Ultrasound    PT Next Visit Plan start treatment for left shoulder from Dr Onnie Graham, continue working on balance, function and gait    Consulted and Agree with Plan of Care Patient;Family member/caregiver    Family Member Consulted wife [name]             Patient will benefit from skilled therapeutic intervention in order to improve the following deficits and impairments:  Abnormal gait, Decreased coordination, Decreased range of motion, Difficulty walking, Increased muscle spasms, Cardiopulmonary status limiting activity, Decreased endurance, Decreased activity tolerance, Pain, Impaired flexibility, Decreased balance, Decreased mobility, Decreased strength, Postural dysfunction, Increased edema  Visit Diagnosis: Other abnormalities of gait and mobility  Repeated falls  Difficulty in walking, not elsewhere classified  Hemiplegia and hemiparesis following cerebral infarction affecting left non-dominant side (HCC)  Muscle weakness (generalized)  Acute pain of left shoulder     Problem List Patient Active Problem List   Diagnosis Date Noted   Acute pain of left shoulder 11/12/2021   Leukocytes in urine 11/12/2021   Urinary frequency 11/12/2021   Thrush 10/08/2021   Hemiplegia, dominant side S/P CVA (cerebrovascular accident) ([place]) 09/11/2021   Insomnia    Prediabetes    Chronic kidney disease (CKD),  stage IV (severe) (HCC)    Basal ganglia infarction ([place]) 07/29/2021   Transaminitis 07/27/2021   UTI (urinary tract infection) 07/27/2021   CVA (cerebral vascular accident) ([place]) 07/27/2021   Fall at home, initial encounter 07/27/2021   Hyperglycemia 07/27/2021   CKD (chronic kidney disease), stage IV ([place]) 07/27/2021   Cholelithiasis 07/27/2021   Hypoxia 07/27/2021   Nausea and vomiting 80/99/8338   Acute metabolic encephalopathy 25/10/3974   Normocytic anemia 07/27/2021   Chronic back pain 07/27/2021   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[place]) 06/22/2021   Closed fracture of first lumbar vertebra with routine healing 02/03/2021   Closed fracture of multiple ribs 11/18/2020   Anxiety 01/29/2020   Leg pain, bilateral 01/29/2020   Ingrown toenail 07/13/2019   Lumbar spondylosis 05/02/2018   Pain in left knee 03/09/2018   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   Trochanteric bursitis of left hip 01/16/2018   Preventative health care 09/26/2017   HTN (hypertension) 07/19/2015   Hyperlipidemia 07/19/2015   Great toe pain 02/11/2014   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 [place], [name], [address] Phone: [phone]   Fax:  [phone]  Name: William [name] MRN: [id number] Date of Birth: Feb 15, 1941

## 2021-11-23 ENCOUNTER — Other Ambulatory Visit: Payer: Self-pay | Admitting: Family Medicine

## 2021-11-23 DIAGNOSIS — R451 Restlessness and agitation: Secondary | ICD-10-CM

## 2021-11-24 ENCOUNTER — Ambulatory Visit: Payer: PPO | Admitting: Speech Pathology

## 2021-11-24 ENCOUNTER — Encounter: Payer: Self-pay | Admitting: Physical Therapy

## 2021-11-24 ENCOUNTER — Encounter: Payer: Self-pay | Admitting: Speech Pathology

## 2021-11-24 ENCOUNTER — Ambulatory Visit: Payer: PPO | Admitting: Physical Therapy

## 2021-11-24 DIAGNOSIS — R61 Generalized hyperhidrosis: Secondary | ICD-10-CM | POA: Diagnosis not present

## 2021-11-24 DIAGNOSIS — R262 Difficulty in walking, not elsewhere classified: Secondary | ICD-10-CM

## 2021-11-24 DIAGNOSIS — R41841 Cognitive communication deficit: Secondary | ICD-10-CM

## 2021-11-24 DIAGNOSIS — R2689 Other abnormalities of gait and mobility: Secondary | ICD-10-CM

## 2021-11-24 DIAGNOSIS — R296 Repeated falls: Secondary | ICD-10-CM

## 2021-11-24 DIAGNOSIS — M6281 Muscle weakness (generalized): Secondary | ICD-10-CM

## 2021-11-24 DIAGNOSIS — I959 Hypotension, unspecified: Secondary | ICD-10-CM | POA: Diagnosis not present

## 2021-11-24 DIAGNOSIS — I69354 Hemiplegia and hemiparesis following cerebral infarction affecting left non-dominant side: Secondary | ICD-10-CM

## 2021-11-24 DIAGNOSIS — R41840 Attention and concentration deficit: Secondary | ICD-10-CM | POA: Diagnosis not present

## 2021-11-24 DIAGNOSIS — M25512 Pain in left shoulder: Secondary | ICD-10-CM
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
## 2021-11-24 NOTE — Telephone Encounter (Signed)
Requesting: lorazepam '1mg'$   Contract: None UDS: 07/22/21 Last Visit: 11/12/21 Next Visit: 12/10/21 Last Refill: 09/15/21 #30 and 1RF  Please Advise

## 2021-11-24 NOTE — Therapy (Signed)
[place]. [place], [name], [address] Phone: [phone]   Fax:  [phone]  Physical Therapy Treatment  Patient Details  Name: William [name] MRN: [id number] Date of Birth: [birth year] Referring Provider (PT): Supple   Encounter Date: 11/24/2021   PT End of Session - 11/24/21 1320     Visit Number 14    Date for PT Re-Evaluation 12/31/21    PT Start Time 1310    PT Stop Time 1400    PT Time Calculation (min) 50 min    Activity Tolerance Patient tolerated treatment well;Patient limited by pain    Behavior During Therapy Restless;Flat affect;Anxious             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   Mild, related to stroke history   Nocturia    Renal insufficiency 06/25/2013   S/P carotid endarterectomy    BILATERAL ICA--  PATENT PER DUPLEX  05-19-2012   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   Right calf   Urinary frequency    Vitamin D deficiency     Past Surgical History:  Procedure Laterality Date   APPENDECTOMY  AS CHILD   CARDIOVASCULAR STRESS TEST  03-27-2012  DR CRENSHAW   LOW RISK LEXISCAN STUDY-- PROBABLE NORMAL PERFUSION AND SOFT TISSUE ATTENUATION/  NO ISCHEMIA/ EF 51%   CAROTID ENDARTERECTOMY Bilateral LEFT  11-12-2008  DR GREG HAYES   RIGHT ICA  2006  ([facility])    CYSTOSCOPY W/ RETROGRADES Bilateral 06/22/2021   Procedure: CYSTOSCOPY WITH RETROGRADE PYELOGRAM;  Surgeon: Franchot Gallo, MD;  Location: [facility];  Service: Urology;  Laterality: Bilateral;   CYSTOSCOPY WITH LITHOLAPAXY N/A 02/26/2013   Procedure: CYSTOSCOPY WITH LITHOLAPAXY;  Surgeon: Franchot Gallo, MD;  Location: [facility];  Service: Urology;  Laterality: N/A;   EYE SURGERY  Jan. 2016   cataract surgery both eyes   INGUINAL HERNIA REPAIR Right 11-08-2006   IR KYPHO EA ADDL LEVEL THORACIC OR LUMBAR  02/12/2021   IR RADIOLOGIST EVAL & MGMT  02/18/2021   MASS EXCISION N/A 03/03/2016   Procedure: EXCISION OF BACK  MASS;  Surgeon: Stark Klein, MD;  Location: [facility];  Service: General;  Laterality: N/A;   MOHS SURGERY Left 1/ 2016   Dr Nevada Crane-- Basal cell   PROSTATE SURGERY     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-C N/A 06/22/2021   Procedure: TRANSURETHRAL RESECTION OF BLADDER TUMOR;  Surgeon: Franchot Gallo, MD;  Location: [facility];  Service: Urology;  Laterality: N/A;   TRANSURETHRAL RESECTION OF PROSTATE N/A 02/26/2013   Procedure: TRANSURETHRAL RESECTION OF THE PROSTATE WITH GYRUS INSTRUMENTS;  Surgeon: Franchot Gallo, MD;  Location: [facility];  Service: Urology;  Laterality:  N/A;   TRANSURETHRAL RESECTION OF PROSTATE N/A 06/22/2021   Procedure: TRANSURETHRAL RESECTION OF THE PROSTATE (TURP);  Surgeon: Franchot Gallo, MD;  Location: [facility];  Service: Urology;  Laterality: N/A;    There were no vitals filed for this visit.   Subjective Assessment - 11/24/21 1321     Subjective Patient and wife reports that he is walking some at home and they feel that they are doing well going 8 houses down.  Reports still shoulder pain    Currently in Pain? Yes    Pain Score 4     Pain Location Shoulder    Pain Orientation Left    Pain Descriptors / Indicators Sore     Aggravating Factors  the falling                               [facility] Adult PT Treatment/Exercise - 11/24/21 0001       Ambulation/Gait   Gait Comments gait outside in the grass, uneven terrain and curbs, a lot of cues to pick up feet, at the end of session I took him out the back door in pine needles and grass and then on the pavement walking around to the front door.  Constant cues to take big steps, tends to drag and shuffle the left leg and keep the left knee bent, stairs step over step up and down      High Level Balance   High Level Balance Comments beach ball hits, cone toe touches, side stepping over stick, a lot of cues and then had to close door due to distraction from outside the door, has difficulty with focus and inattention.      Lumbar Exercises: Aerobic   UBE (Upper Arm Bike) level 1 x 4 minutes      Lumbar Exercises: Machines for Strengthening   Cybex Knee Extension 5# 2x5 constant cues for the use of th eleg    Cybex Knee Flexion 15# 2x10 a lot of cues to perform                       PT Short Term Goals - 11/10/21 1415       PT SHORT TERM GOAL #1   Title independent with initial HEP    Time 2    Period Weeks    Status On-going               PT Long Term Goals - 11/24/21 1403       PT LONG TERM GOAL #1   Title Understand fall risks, the importance of exercise and flexibility to health and function    Status On-going      PT LONG TERM GOAL #2   Title decrease TUG time to 13 seconds    Status Partially Met      PT LONG TERM GOAL #3   Title walk around our building without rest     Status On-going                   Plan - 11/24/21 1403     Clinical Impression Statement Patient really struggles with attention, this is worse with a busy environement, most of todays session was in a room to limit the distractions, he still really has difficulty, on a side stepping over stick exercise for balance he wanted to  go too fast and with verbal visual and tactile  cues he could not do it correctly he wanted to go too fast and not fully step over the stick.  He did not have as much c/o shoulder pain with me but when I asked his wife she reported that he is still c/o pain at home.    PT Next Visit Plan continue to work on balance functional mobility, safety and treat shoulder for pain and ROM as we can    Consulted and Agree with Plan of Care Patient;Family member/caregiver    Family Member Consulted wife [name]             Patient will benefit from skilled therapeutic intervention in order to improve the following deficits and impairments:  Abnormal gait, Decreased coordination, Decreased range of motion, Difficulty walking, Increased muscle spasms, Cardiopulmonary status limiting activity, Decreased endurance, Decreased activity tolerance, Pain, Impaired flexibility, Decreased balance, Decreased mobility, Decreased strength, Postural dysfunction, Increased edema  Visit Diagnosis: Other abnormalities of gait and mobility  Repeated falls  Difficulty in walking, not elsewhere classified  Hemiplegia and hemiparesis following cerebral infarction affecting left non-dominant side (HCC)  Muscle weakness (generalized)  Acute pain of left shoulder     Problem List Patient Active Problem List   Diagnosis Date Noted   Acute pain of left shoulder 11/12/2021   Leukocytes in urine 11/12/2021   Urinary frequency 11/12/2021   Thrush 10/08/2021   Hemiplegia, dominant side S/P CVA (cerebrovascular accident) ([place]) 09/11/2021   Insomnia    Prediabetes    Chronic kidney disease (CKD), stage IV (severe) (HCC)    Basal ganglia infarction ([place]) 07/29/2021   Transaminitis 07/27/2021   UTI (urinary tract infection) 07/27/2021   CVA (cerebral vascular accident) ([place]) 07/27/2021   Fall at home, initial encounter 07/27/2021   Hyperglycemia 07/27/2021   CKD (chronic kidney disease), stage IV ([place]) 07/27/2021    Cholelithiasis 07/27/2021   Hypoxia 07/27/2021   Nausea and vomiting 68/15/9470   Acute metabolic encephalopathy 76/15/1834   Normocytic anemia 07/27/2021   Chronic back pain 07/27/2021   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[place]) 06/22/2021   Closed fracture of first lumbar vertebra with routine healing 02/03/2021   Closed fracture of multiple ribs 11/18/2020   Anxiety 01/29/2020   Leg pain, bilateral 01/29/2020   Ingrown toenail 07/13/2019   Lumbar spondylosis 05/02/2018   Pain in left knee 03/09/2018   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   Trochanteric bursitis of left hip 01/16/2018   Preventative health care 09/26/2017   HTN (hypertension) 07/19/2015   Hyperlipidemia 07/19/2015   Great toe pain 02/11/2014   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 [place], [name], [address] Phone: [phone]   Fax:  [phone]  Name: William [name] MRN: [id number] Date of Birth: 30-Jun-1940

## 2021-11-25 ENCOUNTER — Other Ambulatory Visit (HOSPITAL_BASED_OUTPATIENT_CLINIC_OR_DEPARTMENT_OTHER): Payer: Self-pay

## 2021-11-25 MED ORDER — LORAZEPAM 1 MG PO TABS
1.0000 mg | ORAL_TABLET | Freq: Every day | ORAL | 1 refills | Status: DC
  Filled 2021-11-25: qty 30, 30d supply, fill #0

## 2021-11-26 ENCOUNTER — Encounter: Payer: Self-pay | Admitting: Speech Pathology

## 2021-11-26 ENCOUNTER — Ambulatory Visit: Payer: PPO | Admitting: Speech Pathology

## 2021-11-26 ENCOUNTER — Ambulatory Visit: Payer: PPO | Admitting: Physical Therapy

## 2021-11-26 ENCOUNTER — Ambulatory Visit (HOSPITAL_COMMUNITY)
Admission: RE | Admit: 2021-11-26 | Discharge: 2021-11-26 | Disposition: A | Discharge status: Home / Self Care | Payer: PPO | Source: Ambulatory Visit | Attending: Family Medicine | Admitting: Family Medicine

## 2021-11-26 ENCOUNTER — Encounter: Payer: Self-pay | Admitting: Physical Therapy

## 2021-11-26 DIAGNOSIS — I69354 Hemiplegia and hemiparesis following cerebral infarction affecting left non-dominant side: Secondary | ICD-10-CM

## 2021-11-26 DIAGNOSIS — M6281 Muscle weakness (generalized): Secondary | ICD-10-CM

## 2021-11-26 DIAGNOSIS — M25512 Pain in left shoulder: Secondary | ICD-10-CM

## 2021-11-26 DIAGNOSIS — R41841 Cognitive communication deficit: Secondary | ICD-10-CM

## 2021-11-26 DIAGNOSIS — M19012 Primary osteoarthritis, left shoulder: Secondary | ICD-10-CM | POA: Diagnosis not present

## 2021-11-26 DIAGNOSIS — S46012A Strain of muscle(s) and tendon(s) of the rotator cuff of left shoulder, initial encounter: Secondary | ICD-10-CM | POA: Diagnosis not present

## 2021-11-26 DIAGNOSIS — R262 Difficulty in walking, not elsewhere classified: Secondary | ICD-10-CM

## 2021-11-26 DIAGNOSIS — R296 Repeated falls: Secondary | ICD-10-CM

## 2021-11-26 DIAGNOSIS — R41840 Attention and concentration deficit: Secondary | ICD-10-CM | POA: Diagnosis not present
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
## 2021-11-26 NOTE — Therapy (Signed)
[address] [place] [place], [name], [address] Phone: [phone]   Fax:  [phone]  Physical Therapy Treatment  Patient Details  Name: William [name] MRN: [id number] Date of Birth: Dec 22, 1940 Referring Provider (PT): Supple   Encounter Date: 11/26/2021   PT End of Session - 11/26/21 1525     Visit Number 15    Date for PT Re-Evaluation 12/31/21    PT Start Time 0354    PT Stop Time 1526    PT Time Calculation (min) 39 min    Activity Tolerance Patient tolerated treatment well;No increased pain    Behavior During Therapy Restless;Flat affect;Anxious             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   Mild, related to stroke history   Nocturia    Renal insufficiency 06/25/2013   S/P carotid endarterectomy    BILATERAL ICA--  PATENT PER DUPLEX  05-19-2012   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   Right calf   Urinary frequency    Vitamin D deficiency     Past Surgical History:  Procedure Laterality Date   APPENDECTOMY  AS CHILD   CARDIOVASCULAR STRESS TEST  03-27-2012  DR CRENSHAW   LOW RISK LEXISCAN STUDY-- PROBABLE NORMAL PERFUSION AND SOFT TISSUE ATTENUATION/  NO ISCHEMIA/ EF 51%   CAROTID ENDARTERECTOMY Bilateral LEFT  11-12-2008  DR GREG HAYES   RIGHT ICA  2006  ([facility])    CYSTOSCOPY W/ RETROGRADES Bilateral 06/22/2021   Procedure: CYSTOSCOPY WITH RETROGRADE PYELOGRAM;  Surgeon: Franchot Gallo, MD;  Location: [facility];  Service: Urology;  Laterality: Bilateral;   CYSTOSCOPY WITH LITHOLAPAXY N/A 02/26/2013   Procedure: CYSTOSCOPY WITH LITHOLAPAXY;  Surgeon: Franchot Gallo, MD;  Location: [facility];  Service: Urology;  Laterality: N/A;   EYE SURGERY  Jan. 2016   cataract surgery both eyes   INGUINAL HERNIA REPAIR Right 11-08-2006   IR KYPHO EA ADDL LEVEL THORACIC OR LUMBAR  02/12/2021   IR RADIOLOGIST EVAL & MGMT  02/18/2021   MASS EXCISION N/A 03/03/2016   Procedure: EXCISION OF BACK  MASS;  Surgeon: Stark Klein, MD;  Location: [facility];  Service: General;  Laterality: N/A;   MOHS SURGERY Left 1/ 2016   Dr Nevada Crane-- Basal cell   PROSTATE SURGERY     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-C N/A 06/22/2021   Procedure: TRANSURETHRAL RESECTION OF BLADDER TUMOR;  Surgeon: Franchot Gallo, MD;  Location[facility];  Service: Urology;  Laterality: N/A;   TRANSURETHRAL RESECTION OF PROSTATE N/A 02/26/2013   Procedure: TRANSURETHRAL RESECTION OF THE PROSTATE WITH GYRUS INSTRUMENTS;  Surgeon: Franchot Gallo, MD;  Location: [facility];  Service: Urology;  Laterality: N/A;  TRANSURETHRAL RESECTION OF PROSTATE N/A 06/22/2021   Procedure: TRANSURETHRAL RESECTION OF THE PROSTATE (TURP);  Surgeon: Franchot Gallo, MD;  Location: [facility];  Service: Urology;  Laterality: N/A;    There were no vitals filed for this visit.   Subjective Assessment - 11/26/21 1449     Subjective My shoulder hurts today i want you to put some of that magic cream on it    Pertinent History CVA's    Limitations Standing;Walking;House hold activities    Patient Stated Goals walk beter, move better, no falls    Currently in Pain? Yes    Pain Score 7     Pain Location Shoulder     Pain Orientation Left    Pain Descriptors / Indicators Aching    Pain Type Chronic pain                               [facility] Adult PT Treatment/Exercise - 11/26/21 0001       Lumbar Exercises: Aerobic   Nustep L5 x3 minutes focus on using BUEs L5      Lumbar Exercises: Standing   Wall Slides 20 reps    Wall Slides Limitations flexion on the door   ROM to tolerance   Other Standing Lumbar Exercises seated shoulder flexion x10, shoulder ABD 1x10      Lumbar Exercises: Seated   Other Seated Lumbar Exercises reaches forward/back and side to side with reacher x20 each, CW and CCW circles with reacher x20 each    Other Seated Lumbar Exercises bicep curls 3# 1x15      Lumbar Exercises: Supine   Other Supine Lumbar Exercises L shoulder AAROM/stretching with biofreeze all directions; L shoulder oscillations through abduction ROM                     PT Education - 11/26/21 1524     Education Details exercise form and purpose, multimodal cues    Person(s) Educated Patient    Methods Explanation    Comprehension Verbalized understanding              PT Short Term Goals - 11/10/21 1415       PT SHORT TERM GOAL #1   Title independent with initial HEP    Time 2    Period Weeks    Status On-going               PT Long Term Goals - 11/24/21 1403       PT LONG TERM GOAL #1   Title Understand fall risks, the importance of exercise and flexibility to health and function    Status On-going      PT LONG TERM GOAL #2   Title decrease TUG time to 13 seconds    Status Partially Met      PT LONG TERM GOAL #3   Title walk around our building without rest     Status On-going                   Plan - 11/26/21 1525     Clinical Impression Statement William [name] arrived with his shoulder hurting, wanted me to work on it with some biofreeze again. Able to stretch and even get some AAROM to L shoulder today as well. Tolerated what we did  today in terms of shoulder interventions. Able to get to 90 degrees AAROM L shoulder. Pain down to 0/10  from 7/10 at beginning of session.    Stability/Clinical Decision Making Evolving/Moderate complexity    Clinical Decision Making Low    Rehab Potential Fair    PT Frequency 2x / week    PT Duration Other (comment)    PT Treatment/Interventions ADLs/Self Care Home Management;Electrical Stimulation;Moist Heat;Gait training;Neuromuscular re-education;Balance training;Therapeutic exercise;Therapeutic activities;Functional mobility training;Stair training;Patient/family education;Manual techniques;Vasopneumatic Device;Ultrasound    PT Next Visit Plan continue to work on balance functional mobility, safety and treat shoulder for pain and ROM as we can    Consulted and Agree with Plan of Care Patient             Patient will benefit from skilled therapeutic intervention in order to improve the following deficits and impairments:  Abnormal gait, Decreased coordination, Decreased range of motion, Difficulty walking, Increased muscle spasms, Cardiopulmonary status limiting activity, Decreased endurance, Decreased activity tolerance, Pain, Impaired flexibility, Decreased balance, Decreased mobility, Decreased strength, Postural dysfunction, Increased edema  Visit Diagnosis: Muscle weakness (generalized)  Acute pain of left shoulder  Hemiplegia and hemiparesis following cerebral infarction affecting left non-dominant side (HCC)  Difficulty in walking, not elsewhere classified  Repeated falls     Problem List Patient Active Problem List   Diagnosis Date Noted   Acute pain of left shoulder 11/12/2021   Leukocytes in urine 11/12/2021   Urinary frequency 11/12/2021   Thrush 10/08/2021   Hemiplegia, dominant side S/P CVA (cerebrovascular accident) ([place]) 09/11/2021   Insomnia    Prediabetes    Chronic kidney disease (CKD), stage IV (severe) (HCC)    Basal ganglia infarction ([place])  07/29/2021   Transaminitis 07/27/2021   UTI (urinary tract infection) 07/27/2021   CVA (cerebral vascular accident) ([place]) 07/27/2021   Fall at home, initial encounter 07/27/2021   Hyperglycemia 07/27/2021   CKD (chronic kidney disease), stage IV ([place]) 07/27/2021   Cholelithiasis 07/27/2021   Hypoxia 07/27/2021   Nausea and vomiting 83/25/4982   Acute metabolic encephalopathy 64/15/8309   Normocytic anemia 07/27/2021   Chronic back pain 07/27/2021   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[place]) 06/22/2021   Closed fracture of first lumbar vertebra with routine healing 02/03/2021   Closed fracture of multiple ribs 11/18/2020   Anxiety 01/29/2020   Leg pain, bilateral 01/29/2020   Ingrown toenail 07/13/2019   Lumbar spondylosis 05/02/2018   Pain in left knee 03/09/2018   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   Trochanteric bursitis of left hip 01/16/2018   Preventative health care 09/26/2017   HTN (hypertension) 07/19/2015   Hyperlipidemia 07/19/2015   Great toe pain 02/11/2014   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 [place], [name], [address] Phone: [phone]   Fax:  [phone]  Name: William [name] MRN: [id number] Date of Birth: [birth year]

## 2021-11-27 ENCOUNTER — Other Ambulatory Visit (HOSPITAL_COMMUNITY): Payer: Self-pay | Admitting: Psychiatry

## 2021-11-27 ENCOUNTER — Other Ambulatory Visit: Payer: Self-pay | Admitting: Physician Assistant

## 2021-11-27 ENCOUNTER — Other Ambulatory Visit (HOSPITAL_BASED_OUTPATIENT_CLINIC_OR_DEPARTMENT_OTHER): Payer: Self-pay

## 2021-11-28 DIAGNOSIS — G8929 Other chronic pain: Secondary | ICD-10-CM | POA: Diagnosis not present

## 2021-11-28 DIAGNOSIS — I69359 Hemiplegia and hemiparesis following cerebral infarction affecting unspecified side: Secondary | ICD-10-CM | POA: Diagnosis not present

## 2021-11-28 DIAGNOSIS — S32019D Unspecified fracture of first lumbar vertebra, subsequent encounter for fracture with routine healing: Secondary | ICD-10-CM | POA: Diagnosis not present

## 2021-11-28 DIAGNOSIS — M5430 Sciatica, unspecified side: Secondary | ICD-10-CM | POA: Diagnosis not present

## 2021-11-28 DIAGNOSIS — R0902 Hypoxemia: Secondary | ICD-10-CM | POA: Diagnosis not present

## 2021-11-30 ENCOUNTER — Encounter: Payer: Self-pay | Admitting: Physical Therapy

## 2021-11-30 ENCOUNTER — Ambulatory Visit: Payer: PPO | Admitting: Physical Therapy

## 2021-11-30 DIAGNOSIS — M25512 Pain in left shoulder: Secondary | ICD-10-CM

## 2021-11-30 DIAGNOSIS — R262 Difficulty in walking, not elsewhere classified: Secondary | ICD-10-CM

## 2021-11-30 DIAGNOSIS — R41840 Attention and concentration deficit: Secondary | ICD-10-CM | POA: Diagnosis not present

## 2021-11-30 DIAGNOSIS — M6281 Muscle weakness (generalized): Secondary | ICD-10-CM

## 2021-11-30 DIAGNOSIS — I69354 Hemiplegia and hemiparesis following cerebral infarction affecting left non-dominant side: Secondary | ICD-10-CM

## 2021-11-30 NOTE — Therapy (Signed)
[address] [place], [name], [address] Phone: [phone]   Fax:  [phone]  Physical Therapy Treatment  Patient Details  Name: William [name] MRN: [id number] Date of Birth: [birth year] Referring Provider (PT): Supple   Encounter Date: 11/30/2021   PT End of Session - 11/30/21 1541     Visit Number 16    Date for PT Re-Evaluation 12/31/21    PT Start Time 3559    PT Stop Time 7416    PT Time Calculation (min) 40 min    Activity Tolerance Patient tolerated treatment well;No increased pain    Behavior During Therapy Restless;Flat affect             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   Mild, related to stroke history   Nocturia    Renal insufficiency 06/25/2013   S/P carotid endarterectomy    BILATERAL ICA--  PATENT PER DUPLEX  05-19-2012   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   Right calf   Urinary frequency    Vitamin D deficiency     Past Surgical History:  Procedure Laterality Date   APPENDECTOMY  AS CHILD   CARDIOVASCULAR STRESS TEST  03-27-2012  DR CRENSHAW   LOW RISK LEXISCAN STUDY-- PROBABLE NORMAL PERFUSION AND SOFT TISSUE ATTENUATION/  NO ISCHEMIA/ EF 51%   CAROTID ENDARTERECTOMY Bilateral LEFT  11-12-2008  DR GREG HAYES   RIGHT ICA  2006  ([facility])   CYSTOSCOPY W/  RETROGRADES Bilateral 06/22/2021   Procedure: CYSTOSCOPY WITH RETROGRADE PYELOGRAM;  Surgeon: Franchot Gallo, MD;  Location: [facility];  Service: Urology;  Laterality: Bilateral;   CYSTOSCOPY WITH LITHOLAPAXY N/A 02/26/2013   Procedure: CYSTOSCOPY WITH LITHOLAPAXY;  Surgeon: Franchot Gallo, MD;  Location: [facility]  Service: Urology;  Laterality: N/A;   EYE SURGERY  Jan. 2016   cataract surgery both eyes   INGUINAL HERNIA REPAIR Right 11-08-2006   IR KYPHO EA ADDL LEVEL THORACIC OR LUMBAR  02/12/2021   IR RADIOLOGIST EVAL & MGMT  02/18/2021   MASS EXCISION N/A 03/03/2016   Procedure: EXCISION OF BACK  MASS;  Surgeon: Stark Klein, MD;  Location: [facility];  Service: General;  Laterality: N/A;   MOHS SURGERY Left 1/ 2016   Dr Nevada Crane-- Basal cell   PROSTATE SURGERY     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-C N/A 06/22/2021   Procedure: TRANSURETHRAL RESECTION OF BLADDER TUMOR;  Surgeon: Franchot Gallo, MD;  Location: [facility];  Service: Urology;  Laterality: N/A;   TRANSURETHRAL RESECTION OF PROSTATE N/A 02/26/2013   Procedure: TRANSURETHRAL RESECTION OF THE PROSTATE WITH GYRUS INSTRUMENTS;  Surgeon: Franchot Gallo, MD;  Location: [facility];  Service: Urology;  Laterality: N/A;  TRANSURETHRAL RESECTION OF PROSTATE N/A 06/22/2021   Procedure: TRANSURETHRAL RESECTION OF THE PROSTATE (TURP);  Surgeon: Franchot Gallo, MD;  Location: [facility];  Service: Urology;  Laterality: N/A;    There were no vitals filed for this visit.   Subjective Assessment - 11/30/21 1449     Subjective Shoulder is doing a lot better, I want you to do more on it and use that magic cream    Pertinent History CVA's    Patient Stated Goals walk beter, move better, no falls    Currently in Pain? Yes    Pain Score 7     Pain Location Shoulder    Pain Orientation Left    Pain Descriptors /  Indicators Aching    Pain Type Chronic pain                               [facility] Adult PT Treatment/Exercise - 11/30/21 0001       Lumbar Exercises: Seated   Other Seated Lumbar Exercises shoulder flexion 1x10, shoulder ABD isometric 1x10, hip flexion in chair 2x10    Other Seated Lumbar Exercises door polishing 1x10 up and down and circles CW/CCW; UBE L2 3 min forward and backward      Lumbar Exercises: Supine   Other Supine Lumbar Exercises L shoulder stretching in flexion, ABD, ER after biofreeze mixed with oscillations                     PT Education - 11/30/21 1541     Education Details exercise form/purpose    Person(s) Educated Patient    Methods Explanation    Comprehension Verbalized understanding              PT Short Term Goals - 11/10/21 1415       PT SHORT TERM GOAL #1   Title independent with initial HEP    Time 2    Period Weeks    Status On-going               PT Long Term Goals - 11/24/21 1403       PT LONG TERM GOAL #1   Title Understand fall risks, the importance of exercise and flexibility to health and function    Status On-going      PT LONG TERM GOAL #2   Title decrease TUG time to 13 seconds    Status Partially Met      PT LONG TERM GOAL #3   Title walk around our building without rest     Status On-going                   Plan - 11/30/21 1541     Clinical Impression Statement [name] arrives today doing OK, still wants to focus on his shoulder. We continued with biofreeze and stretching, also tried to get even more active with L shoulder as tolerated. Will continue to progress as able and tolerated, shoulder is definitely coming along but he does continue to demonstrate significant impairments in balance and general mobility. Tried working on some core strength but this was limited by command following and preservation on shoulder today.    Stability/Clinical Decision Making  Evolving/Moderate complexity    Clinical Decision Making Low    Rehab Potential Fair    PT Frequency 2x / week    PT Duration Other (comment)    PT Treatment/Interventions ADLs/Self Care  Home Management;Electrical Stimulation;Moist Heat;Gait training;Neuromuscular re-education;Balance training;Therapeutic exercise;Therapeutic activities;Functional mobility training;Stair training;Patient/family education;Manual techniques;Vasopneumatic Device;Ultrasound    PT Next Visit Plan continue to work on balance functional mobility, safety and treat shoulder for pain and ROM as we can    Consulted and Agree with Plan of Care Patient             Patient will benefit from skilled therapeutic intervention in order to improve the following deficits and impairments:  Abnormal gait, Decreased coordination, Decreased range of motion, Difficulty walking, Increased muscle spasms, Cardiopulmonary status limiting activity, Decreased endurance, Decreased activity tolerance, Pain, Impaired flexibility, Decreased balance, Decreased mobility, Decreased strength, Postural dysfunction, Increased edema  Visit Diagnosis: Muscle weakness (generalized)  Acute pain of left shoulder  Hemiplegia and hemiparesis following cerebral infarction affecting left non-dominant side (HCC)  Difficulty in walking, not elsewhere classified     Problem List Patient Active Problem List   Diagnosis Date Noted   Acute pain of left shoulder 11/12/2021   Leukocytes in urine 11/12/2021   Urinary frequency 11/12/2021   Thrush 10/08/2021   Hemiplegia, dominant side S/P CVA (cerebrovascular accident) ([place]) 09/11/2021   Insomnia    Prediabetes    Chronic kidney disease (CKD), stage IV (severe) (HCC)    Basal ganglia infarction ([place]) 07/29/2021   Transaminitis 07/27/2021   UTI (urinary tract infection) 07/27/2021   CVA (cerebral vascular accident) ([place]) 07/27/2021   Fall at home, initial encounter 07/27/2021   Hyperglycemia  07/27/2021   CKD (chronic kidney disease), stage IV ([place]) 07/27/2021   Cholelithiasis 07/27/2021   Hypoxia 07/27/2021   Nausea and vomiting 88/91/6945   Acute metabolic encephalopathy 03/88/8280   Normocytic anemia 07/27/2021   Chronic back pain 07/27/2021   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[place]) 06/22/2021   Closed fracture of first lumbar vertebra with routine healing 02/03/2021   Closed fracture of multiple ribs 11/18/2020   Anxiety 01/29/2020   Leg pain, bilateral 01/29/2020   Ingrown toenail 07/13/2019   Lumbar spondylosis 05/02/2018   Pain in left knee 03/09/2018   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   Trochanteric bursitis of left hip 01/16/2018   Preventative health care 09/26/2017   HTN (hypertension) 07/19/2015   Hyperlipidemia 07/19/2015   Great toe pain 02/11/2014   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 [place], [name], [address] Phone: [phone]   Fax:  [phone]  Name: GREOGRY GOODWYN MRN: [id number] Date of Birth: [birth year]

## 2021-12-01 ENCOUNTER — Ambulatory Visit: Payer: PPO | Admitting: Occupational Therapy

## 2021-12-01 ENCOUNTER — Encounter: Payer: Self-pay | Admitting: Occupational Therapy

## 2021-12-01 ENCOUNTER — Encounter: Payer: Self-pay | Admitting: Speech Pathology

## 2021-12-01 ENCOUNTER — Ambulatory Visit: Payer: PPO | Admitting: Speech Pathology

## 2021-12-01 DIAGNOSIS — R2689 Other abnormalities of gait and mobility: Secondary | ICD-10-CM

## 2021-12-01 DIAGNOSIS — R41840 Attention and concentration deficit: Secondary | ICD-10-CM | POA: Diagnosis not present

## 2021-12-01 DIAGNOSIS — R41841 Cognitive communication deficit: Secondary | ICD-10-CM

## 2021-12-01 DIAGNOSIS — I69318 Other symptoms and signs involving cognitive functions following cerebral infarction: Secondary | ICD-10-CM

## 2021-12-01 DIAGNOSIS — R278 Other lack of coordination: Secondary | ICD-10-CM

## 2021-12-01 DIAGNOSIS — R296 Repeated falls: Secondary | ICD-10-CM

## 2021-12-01 NOTE — Therapy (Unsigned)
OUTPATIENT OCCUPATIONAL THERAPY TREATMENT NOTE & PROGRESS NOTE   Patient Name: William [name] MRN: [id number] DOB:March 14, 1941, 81 y.o., male Today's Date: 12/01/2021  PCP: Roma Schanz, DO REFERRING PROVIDER: Carollee Herter, Alferd Apa, DO   OT End of Session - 12/01/21 1454     Visit Number 11    Number of Visits 13    Date for OT Re-Evaluation 12/14/21    Authorization Type [employer]    Authorization Time Period VL: [place]    Progress Note Due on Visit 10    OT Start Time 1446    OT Stop Time 1530    OT Time Calculation (min) 44 min    Activity Tolerance Patient limited by pain   L shoulder pain   Behavior During Therapy Restless;Flat affect;Anxious            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   Mild, related to stroke history   Nocturia    Renal insufficiency 06/25/2013   S/P carotid endarterectomy    BILATERAL ICA--  PATENT PER DUPLEX  05-19-2012   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   Right calf   Urinary frequency    Vitamin D deficiency    Past Surgical History:  Procedure Laterality Date   APPENDECTOMY  AS CHILD   CARDIOVASCULAR STRESS TEST  03-27-2012  DR CRENSHAW   LOW RISK LEXISCAN STUDY-- PROBABLE NORMAL PERFUSION AND SOFT TISSUE ATTENUATION/  NO ISCHEMIA/ EF 51%   CAROTID ENDARTERECTOMY Bilateral LEFT  11-12-2008  DR  GREG HAYES   RIGHT ICA  2006  ([facility])   CYSTOSCOPY W/ RETROGRADES Bilateral 06/22/2021   Procedure: CYSTOSCOPY WITH RETROGRADE PYELOGRAM;  Surgeon: Franchot Gallo, MD;  Location: [facility];  Service: Urology;  Laterality: Bilateral;   CYSTOSCOPY WITH LITHOLAPAXY N/A 02/26/2013   Procedure: CYSTOSCOPY WITH LITHOLAPAXY;  Surgeon: Franchot Gallo, MD;  Location: [facility];  Service: Urology;  Laterality: N/A;   EYE SURGERY  Jan. 2016   cataract surgery both eyes   INGUINAL HERNIA REPAIR Right 11-08-2006   IR KYPHO EA ADDL LEVEL THORACIC OR LUMBAR  02/12/2021   IR RADIOLOGIST EVAL & MGMT  02/18/2021   MASS EXCISION N/A 03/03/2016   Procedure: EXCISION OF BACK  MASS;  Surgeon: Stark Klein, MD;  Location: [facility];  Service: General;  Laterality: N/A;   MOHS SURGERY Left 1/ 2016   Dr Nevada Crane-- Basal cell   PROSTATE SURGERY     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-C N/A 06/22/2021   Procedure: TRANSURETHRAL RESECTION OF BLADDER TUMOR;  Surgeon: Franchot Gallo, MD;  Location[facility];  Service: Urology;  Laterality: N/A;   TRANSURETHRAL RESECTION OF PROSTATE N/A 02/26/2013   Procedure: TRANSURETHRAL RESECTION OF THE PROSTATE WITH  GYRUS INSTRUMENTS;  Surgeon: Franchot Gallo, MD;  Location: [facility];  Service: Urology;  Laterality: N/A;   TRANSURETHRAL RESECTION OF PROSTATE N/A 06/22/2021   Procedure: TRANSURETHRAL RESECTION OF THE PROSTATE (TURP);  Surgeon: Franchot Gallo, MD;  Location: [facility];  Service: Urology;  Laterality: N/A;   Patient Active Problem List   Diagnosis Date Noted   Acute pain of left shoulder 11/12/2021   Leukocytes in urine 11/12/2021   Urinary frequency 11/12/2021   Thrush 10/08/2021   Hemiplegia, dominant side S/P CVA (cerebrovascular accident) ([place]) 09/11/2021   Insomnia    Prediabetes    Chronic kidney disease (CKD), stage IV (severe)  ([place])    Basal ganglia infarction ([place]) 07/29/2021   Transaminitis 07/27/2021   UTI (urinary tract infection) 07/27/2021   CVA (cerebral vascular accident) ([place]) 07/27/2021   Fall at home, initial encounter 07/27/2021   Hyperglycemia 07/27/2021   CKD (chronic kidney disease), stage IV ([place]) 07/27/2021   Cholelithiasis 07/27/2021   Hypoxia 07/27/2021   Nausea and vomiting 24/40/1027   Acute metabolic encephalopathy 25/36/6440   Normocytic anemia 07/27/2021   Chronic back pain 07/27/2021   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[place]) 06/22/2021   Closed fracture of first lumbar vertebra with routine healing 02/03/2021   Closed fracture of multiple ribs 11/18/2020   Anxiety 01/29/2020   Leg pain, bilateral 01/29/2020   Ingrown toenail 07/13/2019   Lumbar spondylosis 05/02/2018   Pain in left knee 03/09/2018   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   Trochanteric bursitis of left hip 01/16/2018   Preventative health care 09/26/2017   HTN (hypertension) 07/19/2015   Hyperlipidemia 07/19/2015   Great toe pain 02/11/2014   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   Obesity (BMI 30-39.9) 06/25/2013   Renal insufficiency 06/25/2013   Weakness of left arm 06/25/2013   Sebaceous cyst 03/03/2011   Sprain of lumbar region 07/31/2010   Rib pain, left 08/29/2009   Carotid artery stenosis, asymptomatic, bilateral 05/02/2009   Eczema, atopic 05/31/2008   Vitamin D deficiency 03/01/2008   BPH (benign prostatic hyperplasia) 08/06/2007   Fasting hyperglycemia 12/21/2006   History of right MCA infarct 2006    ONSET DATE: 07/27/21  REFERRING DIAG: R41.4 (ICD-10-CM) - Hemi-inattention I63.9 (ICD-10-CM) - Cerebrovascular accident (CVA), unspecified mechanism ([place])   THERAPY DIAG:  No diagnosis found.  PERTINENT HISTORY: L basal ganglia and corona radiata infarct 07/27/21 (had documented AMS for about 5 days prior w/ hospital work-up including MRI negative and symptoms  attributed to a UTI); L lower homonymous quadrantanopia. Other PMH includes R MCA infarct in 2006 w/ L-sided hemiparesis, NCD, carotid artery disease (bilateral), HTN, HLD,CKD3, and hx of bladder cancer  PRECAUTIONS: Fall risk  SUBJECTIVE:   SUBJECTIVE ASSESSMENT: Pt continues to endorse significant L shoulder pain, particularly w/ movement  PAIN: Are you having pain? Yes: NPRS scale: 6/10 Pain location: L shoulder Pain description: sore Aggravating factors: movement Relieving factors: ice/heat   OBJECTIVE:   TODAY'S TREATMENT - 12/01/21: Folding towel in a 3-step sequence; OT provided breakdown of task, repetition, verbal cues, and blocking for errorless learning. Previously used high contrast visual cues also incorporated for sequencing and additional modeling w/ positive results; success improved w/ repetition. Practiced pouring of cereal w/ box, bowl, and sugar brought from home; pt able to take bag out of box and pour "milk" into bowl w/out difficulty, requiring verbal cues and assist to fully open bag before pouring, assist for repositioning of hand placement for efficiency  and to decrease opportunity for spills, and verbal/gestural cues to terminate scooping and adding of sugar. OT problem-solved w/ pt's spouse to determine potential compensatory method of pouring sugar to increase success at home Simulated brushing of teeth w/ pt's own toothbrush, toothpaste, and rinse cup: pt required Mod A and cues for sequencing and to move to different areas of the mouth, but was able to put toothpaste on toothbrush and rinse toothbrush after brushing w/ Mod I. Increased difficulty w/ sequencing and perseveration during rinse and spit w/ a cup; OT recommended pt's spouse monitor to ensure pt is not swallowing toothpaste due to difficulty observed w/ spitting   PATIENT EDUCATION: Continued condition-specific education, particularly regarding increased participation in functional tasks and safety  at home, answering pt's spouse's questions prn Person educated: Patient and Spouse Education method: Explanation Education comprehension: verbalized understanding   GOALS: Goals reviewed with patient? Yes   SHORT TERM GOALS: Target date:    STG   Status:  1 Pt/caregiver will verbalize understanding of visual perceptual compensatory strategies Baseline: Decreased knowledge of visual deficits and strategies On-going  2 Pt will complete symbol cancellation task w/ at least 50% accuracy using visual compensatory strategies to indicate improved attention Baseline: 40% accuracy w/ multiple errors Achieved - 11/17/21  3 Pt will participate in bilateral coordination activity w/ no more than 3 cues for incorporation of LUE Baseline: L-sided inattention On-going      LONG TERM GOALS: Target date: 11/26/2021   LTG   Status:  1 Pt will be able to thread LUE into shirt sleeve w/ SPV in at least 1/2 trials Baseline: Min A to don/doff shirt sleeve on L side On-going  2 Pt will be able to thread bottoms on BLEs w/ Mod A Baseline: Max A for LB dressing On-going  3 Pt will demonstrate supine-to-sit transition w/ cues from caregiver and incorporation of AE/DME prn Baseline: Mod-Max A w/ bed mobility On-going  4 Pt will be able to use TV remote (or adaptive remote) to complete 1 task (e.g., turn TV on/of, turn volume up/down) w/ SPV Baseline: Difficulty  On-going  5 Pt will increase Barthel Index score by at least 5 to indicate improved level of participation in BADLs Baseline: 60/100 On-going      ASSESSMENT:   CLINICAL IMPRESSION: L shoulder pain s/p fall on 11/06/21 continues to be limiting to addressing of LUE awareness and somatosensory input, large amplitude movement, and simulated functional transfers and transitions. However, pt's spouse brought in simple functional activities OT was able to incorporate in session to address safety and participation during tasks while simulating context  and tools used at home as closely as able. Across activities, pt demonstrated difficulty w/ sequencing, termination of task, and problem-solving, but was largely able to complete tasks safely w/ SPV. Pt will continue to benefit from skilled OT services to address deficits mentioned in progress note section above in order to improve participation in ADLs and decrease caregiver strain.   PERFORMANCE DEFICITS in functional skills including ADLs, IADLs, coordination, dexterity, [place], GMC, mobility, balance, and vision, cognitive skills including attention, memory, orientation, problem solving, safety awareness, sequencing, and temperament/personality, and psychosocial skills including environmental adaptation, interpersonal interactions, and routines and behaviors.    IMPAIRMENTS are limiting patient from ADLs, IADLs, rest and sleep, leisure, and social participation.    COMORBIDITIES may have co-morbidities that affects occupational performance. Patient will benefit from skilled OT to address above impairments and improve overall function.     PLAN: OT  FREQUENCY: 2x/week   OT DURATION: 6 weeks   PLANNED INTERVENTIONS: self care/ADL training, therapeutic exercise, therapeutic activity, neuromuscular re-education, balance training, functional mobility training, aquatic therapy, splinting, moist heat, cryotherapy, patient/family education, cognitive remediation/compensation, visual/perceptual remediation/compensation, psychosocial skills training, energy conservation, and DME and/or AE instructions   RECOMMENDED OTHER SERVICES: Currently receiving PT/ST services at this location   CONSULTED AND AGREED WITH PLAN OF CARE: Patient and family member/caregiver   PLAN FOR NEXT SESSION: Large amplitude movements (trunk rotation, cross body reaching, trunk extension); continue to address functional transitions/transfers (elevated HOB; bed ladder; bed rail), dressing, and retropulsion and L-sided  attention/bilateral tasks   Kathrine Cords, MSOT, OTR/L 12/01/21, 3:00 PM

## 2021-12-02 ENCOUNTER — Ambulatory Visit: Payer: PPO | Admitting: Physical Therapy

## 2021-12-02 ENCOUNTER — Encounter: Payer: Self-pay | Admitting: Physical Therapy

## 2021-12-02 DIAGNOSIS — I69354 Hemiplegia and hemiparesis following cerebral infarction affecting left non-dominant side: Secondary | ICD-10-CM

## 2021-12-02 DIAGNOSIS — M25512 Pain in left shoulder: Secondary | ICD-10-CM

## 2021-12-02 DIAGNOSIS — M6281 Muscle weakness (generalized): Secondary | ICD-10-CM

## 2021-12-02 DIAGNOSIS — R2689 Other abnormalities of gait and mobility: Secondary | ICD-10-CM

## 2021-12-02 DIAGNOSIS — R41840 Attention and concentration deficit: Secondary | ICD-10-CM | POA: Diagnosis not present

## 2021-12-02 DIAGNOSIS — R262 Difficulty in walking, not elsewhere classified: Secondary | ICD-10-CM

## 2021-12-02 DIAGNOSIS — R296 Repeated falls: Secondary | ICD-10-CM
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
## 2021-12-02 NOTE — Therapy (Signed)
[address] [place], [name], [address] Phone: [phone]   Fax:  [phone]  Physical Therapy Treatment  Patient Details  Name: William [name] MRN: [id number] Date of Birth: 07-05-1940 Referring Provider (PT): Supple   Encounter Date: 12/02/2021   PT End of Session - 12/02/21 1537     Visit Number 17    Date for PT Re-Evaluation 12/31/21    PT Start Time 1528    PT Stop Time 9518    PT Time Calculation (min) 45 min    Activity Tolerance Patient tolerated treatment well;No increased pain    Behavior During Therapy Restless;Flat affect;Anxious             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   Mild, related to stroke history   Nocturia    Renal insufficiency 06/25/2013   S/P carotid endarterectomy    BILATERAL ICA--  PATENT PER DUPLEX  05-19-2012   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;  Service: Urology;  Laterality: Bilateral;   CYSTOSCOPY WITH LITHOLAPAXY N/A 02/26/2013   Procedure: CYSTOSCOPY WITH LITHOLAPAXY;  Surgeon: Franchot Gallo, MD;  Location[facility];  Service: Urology;  Laterality: N/A;   EYE SURGERY  Jan. 2016   cataract surgery both eyes   INGUINAL HERNIA REPAIR Right 11-08-2006   IR KYPHO EA ADDL LEVEL THORACIC OR LUMBAR  02/12/2021   IR RADIOLOGIST EVAL & MGMT  02/18/2021   MASS EXCISION N/A 03/03/2016   Procedure: EXCISION OF BACK  MASS;  Surgeon: Stark Klein, MD;  Location: [facility];  Service: General;  Laterality: N/A;   MOHS SURGERY Left 1/ 2016   Dr Nevada Crane-- Basal cell   PROSTATE SURGERY     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-C N/A 06/22/2021   Procedure: TRANSURETHRAL RESECTION OF BLADDER TUMOR;  Surgeon: Franchot Gallo, MD;  Location: [facility];  Service: Urology;  Laterality: N/A;   TRANSURETHRAL RESECTION OF PROSTATE N/A 02/26/2013   Procedure: TRANSURETHRAL RESECTION OF THE PROSTATE WITH GYRUS INSTRUMENTS;  Surgeon: Franchot Gallo, MD;  [facility];  Service: Urology;  Laterality: N/A;  TRANSURETHRAL RESECTION OF PROSTATE N/A 06/22/2021   Procedure: TRANSURETHRAL RESECTION OF THE PROSTATE (TURP);  Surgeon: Franchot Gallo, MD;  Location: [facility];  Service: Urology;  Laterality: N/A;    There were no vitals filed for this visit.   Subjective Assessment - 12/02/21 1538     Subjective shoulder pain a 7/10, reports unable to get into car, reports no falls    Currently in Pain? Yes    Pain Score 7     Pain Location Shoulder    Pain Orientation Left    Aggravating Factors  use of the left arm                               [facility] Adult PT  Treatment/Exercise - 12/02/21 0001       Ambulation/Gait   Gait Comments outside out back door to the car, a lot of cues to take big stepsm smooth and heel strike, had to stop him 4x to regroup and get back in control, as his feet are shuffling and going slow and his body is leaning forward      High Level Balance   High Level Balance Comments beach ball hits, cone toe touches, side stepping over stick, a lot of cues and then had to close door due to distraction from outside the door, has difficulty with focus and inattention.                       PT Short Term Goals - 11/10/21 1415       PT SHORT TERM GOAL #1   Title independent with initial HEP    Time 2    Period Weeks    Status On-going               PT Long Term Goals - 12/02/21 1613       PT LONG TERM GOAL #1   Title Understand fall risks, the importance of exercise and flexibility to health and function    Status Partially Met      PT LONG TERM GOAL #2   Title decrease TUG time to 13 seconds    Status Partially Met                   Plan - 12/02/21 1613     Clinical Impression Statement William [name] did well with balance today I did the balance in a room where he had less distractions.Still has difficulty side stepping over objects, with the walking he really gets out of control and need the cue to stop and regroup as his feet are behind with really shuffling steps.  Did work with him and his wife on this to help with him being safer, worked on Mining engineer, he cannot get his bottom over, once sittingin car he is on his left buttock and leaning on his left side and cannot correct, has to get out and attempt again    PT Next Visit Plan continue to work on balance functional mobility, safety and treat shoulder for pain and ROM as we can    Consulted and Agree with Plan of Care Patient             Patient will benefit from skilled therapeutic intervention in order to improve the following deficits  and impairments:  Abnormal gait, Decreased coordination, Decreased range of motion, Difficulty walking, Increased muscle spasms, Cardiopulmonary status limiting activity,  Decreased endurance, Decreased activity tolerance, Pain, Impaired flexibility, Decreased balance, Decreased mobility, Decreased strength, Postural dysfunction, Increased edema  Visit Diagnosis: Muscle weakness (generalized)  Acute pain of left shoulder  Hemiplegia and hemiparesis following cerebral infarction affecting left non-dominant side (HCC)  Difficulty in walking, not elsewhere classified  Repeated falls  Other abnormalities of gait and mobility     Problem List Patient Active Problem List   Diagnosis Date Noted   Acute pain of left shoulder 11/12/2021   Leukocytes in urine 11/12/2021   Urinary frequency 11/12/2021   Thrush 10/08/2021   Hemiplegia, dominant side S/P CVA (cerebrovascular accident) ([place]) 09/11/2021   Insomnia    Prediabetes    Chronic kidney disease (CKD), stage IV (severe) (HCC)    Basal ganglia infarction ([place]) 07/29/2021   Transaminitis 07/27/2021   UTI (urinary tract infection) 07/27/2021   CVA (cerebral vascular accident) ([place]) 07/27/2021   Fall at home, initial encounter 07/27/2021   Hyperglycemia 07/27/2021   CKD (chronic kidney disease), stage IV ([place]) 07/27/2021   Cholelithiasis 07/27/2021   Hypoxia 07/27/2021   Nausea and vomiting 73/57/8978   Acute metabolic encephalopathy 47/84/1282   Normocytic anemia 07/27/2021   Chronic back pain 07/27/2021   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[place]) 06/22/2021   Closed fracture of first lumbar vertebra with routine healing 02/03/2021   Closed fracture of multiple ribs 11/18/2020   Anxiety 01/29/2020   Leg pain, bilateral 01/29/2020   Ingrown toenail 07/13/2019   Lumbar spondylosis 05/02/2018   Pain in left knee 03/09/2018   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   Trochanteric bursitis of left hip 01/16/2018    Preventative health care 09/26/2017   HTN (hypertension) 07/19/2015   Hyperlipidemia 07/19/2015   Great toe pain 02/11/2014   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 [place], [name], [address] Phone: [phone]   Fax:  [phone]  Name: William [name] MRN: [id number] Date of Birth: March 08, 1941

## 2021-12-03 ENCOUNTER — Ambulatory Visit: Payer: PPO | Admitting: Speech Pathology

## 2021-12-03 ENCOUNTER — Ambulatory Visit: Payer: PPO | Admitting: Occupational Therapy

## 2021-12-03 DIAGNOSIS — R278 Other lack of coordination: Secondary | ICD-10-CM

## 2021-12-03 DIAGNOSIS — R41840 Attention and concentration deficit: Secondary | ICD-10-CM

## 2021-12-03 DIAGNOSIS — I69318 Other symptoms and signs involving cognitive functions following cerebral infarction: Secondary | ICD-10-CM

## 2021-12-03 DIAGNOSIS — R296 Repeated falls: Secondary | ICD-10-CM

## 2021-12-03 DIAGNOSIS — R2689 Other abnormalities of gait and mobility: Secondary | ICD-10-CM

## 2021-12-05 NOTE — Therapy (Signed)
OUTPATIENT OCCUPATIONAL THERAPY TREATMENT NOTE   Patient Name: William [name] MRN: [id number] DOB:November 22, 1940, 81 y.o., male Today's Date: 12/05/2021  PCP: Seabron Spates, DO REFERRING PROVIDER: [name], [name], DO     OT End of Session - 12/01/21 1454       Visit Number 12    Number of Visits 13     Date for OT Re-Evaluation 12/14/21     Authorization Type [employer]     Authorization Time Period VL: [place]     Progress Note Due on Visit 10     OT Start Time 1530    OT Stop Time 1618     OT Time Calculation (min) 48 min     Activity Tolerance Patient tolerated treatment well    Behavior During Therapy Flat affect;Anxious        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   Mild, related to stroke history   Nocturia    Renal insufficiency 06/25/2013   S/P carotid endarterectomy    BILATERAL ICA--  PATENT PER DUPLEX  05-19-2012   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   Right calf   Urinary frequency    Vitamin D deficiency    Past Surgical History:  Procedure Laterality Date   APPENDECTOMY  AS CHILD   CARDIOVASCULAR STRESS TEST  03-27-2012  DR CRENSHAW   LOW RISK LEXISCAN STUDY-- PROBABLE NORMAL PERFUSION AND SOFT TISSUE ATTENUATION/  NO ISCHEMIA/ EF 51%   CAROTID ENDARTERECTOMY Bilateral LEFT  11-12-2008  DR GREG HAYES   RIGHT ICA  2006   ([facility])   CYSTOSCOPY W/ RETROGRADES Bilateral 06/22/2021   Procedure: CYSTOSCOPY WITH RETROGRADE PYELOGRAM;  Surgeon: Marcine Matar, MD;  Location: [facility];  Service: Urology;  Laterality: Bilateral;   CYSTOSCOPY WITH LITHOLAPAXY N/A 02/26/2013   Procedure: CYSTOSCOPY WITH LITHOLAPAXY;  Surgeon: Marcine Matar, MD;  Location: [facility];  Service: Urology;  Laterality: N/A;   EYE SURGERY  Jan. 2016   cataract surgery both eyes   INGUINAL HERNIA REPAIR Right 11-08-2006   IR KYPHO EA ADDL LEVEL THORACIC OR LUMBAR  02/12/2021   IR RADIOLOGIST EVAL & MGMT  02/18/2021   MASS EXCISION N/A 03/03/2016   Procedure: EXCISION OF BACK  MASS;  Surgeon: Almond Lint, MD;  Location: [facility];  Service: General;  Laterality: N/A;   MOHS SURGERY Left 1/ 2016   Dr Margo Aye-- Basal cell   PROSTATE SURGERY     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-C N/A 06/22/2021   Procedure: TRANSURETHRAL RESECTION OF BLADDER TUMOR;  Surgeon: Marcine Matar, MD;  Location: [facility];  Service: Urology;  Laterality: N/A;   TRANSURETHRAL RESECTION OF PROSTATE N/A 02/26/2013   Procedure: TRANSURETHRAL RESECTION OF THE PROSTATE WITH  GYRUS INSTRUMENTS;  Surgeon: Marcine Matar, MD;  Location: [facility];  Service: Urology;  Laterality: N/A;   TRANSURETHRAL RESECTION OF PROSTATE N/A 06/22/2021   Procedure: TRANSURETHRAL RESECTION OF THE PROSTATE (TURP);  Surgeon: Marcine Matar, MD;  Location: [facility];  Service: Urology;  Laterality: N/A;   Patient Active Problem List   Diagnosis Date Noted   Acute pain of left shoulder 11/12/2021   Leukocytes in urine 11/12/2021   Urinary frequency 11/12/2021   Thrush 10/08/2021   Hemiplegia, 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   Insomnia    Prediabetes    Chronic kidney disease (CKD), stage IV (severe) (HCC)    Basal ganglia infarction  (HCC) 07/29/2021   Transaminitis 07/27/2021   UTI (urinary tract infection) 07/27/2021   CVA (cerebral vascular accident) (HCC) 07/27/2021   Fall at home, initial encounter 07/27/2021   Hyperglycemia 07/27/2021   CKD (chronic kidney disease), stage IV (HCC) 07/27/2021   Cholelithiasis 07/27/2021   Hypoxia 07/27/2021   Nausea and vomiting 07/27/2021   Acute metabolic encephalopathy 07/27/2021   Normocytic anemia 07/27/2021   Chronic back pain 07/27/2021   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   Closed fracture of first lumbar vertebra with routine healing 02/03/2021   Closed fracture of multiple ribs 11/18/2020   Anxiety 01/29/2020   Leg pain, bilateral 01/29/2020   Ingrown toenail 07/13/2019   Lumbar spondylosis 05/02/2018   Pain in left knee 03/09/2018   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   Trochanteric bursitis of left hip 01/16/2018   Preventative health care 09/26/2017   HTN (hypertension) 07/19/2015   Hyperlipidemia 07/19/2015   Great toe pain 02/11/2014   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   Obesity (BMI 30-39.9) 06/25/2013   Renal insufficiency 06/25/2013   Weakness of left arm 06/25/2013   Sebaceous cyst 03/03/2011   Sprain of lumbar region 07/31/2010   Rib pain, left 08/29/2009   Carotid artery stenosis, asymptomatic, bilateral 05/02/2009   Eczema, atopic 05/31/2008   Vitamin D deficiency 03/01/2008   BPH (benign prostatic hyperplasia) 08/06/2007   Fasting hyperglycemia 12/21/2006   History of right MCA infarct 2006    ONSET DATE: 07/27/21  REFERRING DIAG: R41.4 (ICD-10-CM) - Hemi-inattention I63.9 (ICD-10-CM) - Cerebrovascular accident (CVA), unspecified mechanism (HCC)   THERAPY DIAG:  Attention and concentration deficit  Other abnormalities of gait and mobility  Repeated falls  Other symptoms and signs involving cognitive functions following cerebral infarction  Other lack of coordination  PERTINENT  HISTORY: L basal ganglia and corona radiata infarct 07/27/21 (had documented AMS for about 5 days prior w/ hospital work-up including MRI negative and symptoms attributed to a UTI); L lower homonymous quadrantanopia. Other PMH includes R MCA infarct in 2006 w/ L-sided hemiparesis, NCD, carotid artery disease (bilateral), HTN, HLD,CKD3, and hx of bladder cancer  PRECAUTIONS: Fall risk  SUBJECTIVE:   SUBJECTIVE ASSESSMENT: "I like it" (referring to marble tower activity)  PAIN: Are you having pain? No   OBJECTIVE:   TODAY'S TREATMENT: Bed mobility: completed transition from sitting EOM to supine w/ HOB elevated w/ SPV and verbal cues for repositioning in both attempts. Transition from supine to return to seated position then completed w/ adaptive bed ladder requiring Min A in first attempt and SPV in second attempt; OT provided verbal and gestural cues for sequencing and repositioning prn Constructional activity w/ pt's self-purchased marble tower to facilitate bilateral integration, L-sided awareness, alternating attention, visual perception/memory, and problem solving. Completed  multiple attempts w/ varying levels of assist and OT grading task up/down during activity as needed. Completed 1 attempt building tower based on provided pictorial model w/ OT incorporating binary choice, coaching, and errorless learning to facilitate success; pt able to construct small freeform towers in multiple attempts w/ assist for problem-solving prn    PATIENT EDUCATION: Provided education related to beneficial cueing when participating in marble tower activity at home Person educated: Patient and Spouse Education method: Explanation Education comprehension: verbalized understanding   GOALS: Goals reviewed with patient? Yes   SHORT TERM GOALS: Target date: Nov 14, 2021   STG   Status:  1 Pt/caregiver will verbalize understanding of visual perceptual compensatory strategies Baseline: Decreased knowledge of  visual deficits and strategies Progressing  2 Pt will complete symbol cancellation task w/ at least 50% accuracy using visual compensatory strategies to indicate improved attention Baseline: 40% accuracy w/ multiple errors Achieved - 11/17/21  3 Pt will participate in bilateral coordination activity w/ no more than 3 cues for incorporation of LUE Baseline: L-sided inattention Progressing      LONG TERM GOALS: Target date: 11/26/2021   LTG   Status:  1 Pt will be able to thread LUE into shirt sleeve w/ SPV in at least 1/2 trials Baseline: Min A to don/doff shirt sleeve on L side Progressing  2 Pt will be able to thread bottoms on BLEs w/ Mod A Baseline: Max A for LB dressing Progressing  3 Pt will demonstrate supine-to-sit transition w/ cues from caregiver and incorporation of AE/DME prn Baseline: Mod-Max A w/ bed mobility Met - 12/03/21; SPV w/ bed ladder and cues  4 Pt will be able to use TV remote (or adaptive remote) to complete 1 task (e.g., turn TV on/of, turn volume up/down) w/ SPV Baseline: Difficulty  Progressing  5 Pt will increase Barthel Index score by at least 5 to indicate improved level of participation in BADLs Baseline: 60/100 Progressing      ASSESSMENT:   CLINICAL IMPRESSION: Pt less impacted by L shoulder pain s/p fall about a month ago, able to participate in constructional activity for bilateral coordination w/out difficulty this session. Pt's wife brought in self-purchased marble tower and OT incorporated this into session to identify beneficial cues and adaptations to facilitate successful engagement in activity at home. Pt clearly enjoyed activity and verbalized wanting to participate in activity at home. Pt continues to demonstrate difficulty w/ sequencing, termination of tasks, and problem-solving, but these have improved since start of OP therapies. Balance and transitional movements have also improved notably over the past few weeks.   PERFORMANCE DEFICITS in  functional skills including ADLs, IADLs, coordination, dexterity, FMC, GMC, mobility, balance, and vision, cognitive skills including attention, memory, orientation, problem solving, safety awareness, sequencing, and temperament/personality, and psychosocial skills including environmental adaptation, interpersonal interactions, and routines and behaviors.    IMPAIRMENTS are limiting patient from ADLs, IADLs, rest and sleep, leisure, and social participation.    COMORBIDITIES may have co-morbidities that affects occupational performance. Patient will benefit from skilled OT to address above impairments and improve overall function.     PLAN: OT FREQUENCY: 2x/week   OT DURATION: 6 weeks   PLANNED INTERVENTIONS: self care/ADL training, therapeutic exercise, therapeutic activity, neuromuscular re-education, balance training, functional mobility training, aquatic therapy, splinting, moist heat, cryotherapy, patient/family education, cognitive remediation/compensation, visual/perceptual remediation/compensation, psychosocial skills training, energy conservation, and DME and/or AE instructions   RECOMMENDED OTHER SERVICES: Currently receiving PT/ST services at this location   CONSULTED AND AGREED WITH  PLAN OF CARE: Patient and family member/caregiver   PLAN FOR NEXT SESSION: Transition w/ bed ladder toward R side (simulating home context); large amplitude movements (trunk rotation, cross body reaching, trunk extension); L-sided attention/bilateral tasks   Rosie Fate, MSOT, OTR/L 12/03/21, 5:08 PM

## 2021-12-07 ENCOUNTER — Encounter: Payer: Self-pay | Admitting: Physical Therapy

## 2021-12-07 ENCOUNTER — Ambulatory Visit: Payer: PPO | Admitting: Physical Therapy

## 2021-12-07 ENCOUNTER — Other Ambulatory Visit (HOSPITAL_BASED_OUTPATIENT_CLINIC_OR_DEPARTMENT_OTHER): Payer: Self-pay

## 2021-12-07 DIAGNOSIS — R2689 Other abnormalities of gait and mobility: Secondary | ICD-10-CM

## 2021-12-07 DIAGNOSIS — R41840 Attention and concentration deficit: Secondary | ICD-10-CM

## 2021-12-07 DIAGNOSIS — I69318 Other symptoms and signs involving cognitive functions following cerebral infarction: Secondary | ICD-10-CM

## 2021-12-07 DIAGNOSIS — M6281 Muscle weakness (generalized): Secondary | ICD-10-CM

## 2021-12-07 DIAGNOSIS — R296 Repeated falls: Secondary | ICD-10-CM

## 2021-12-07 DIAGNOSIS — R262 Difficulty in walking, not elsewhere classified: Secondary | ICD-10-CM

## 2021-12-07 DIAGNOSIS — I69354 Hemiplegia and hemiparesis following cerebral infarction affecting left non-dominant side: Secondary | ICD-10-CM

## 2021-12-07 DIAGNOSIS — M25512 Pain in left shoulder: Secondary | ICD-10-CM

## 2021-12-08 ENCOUNTER — Ambulatory Visit: Payer: PPO | Admitting: Speech Pathology

## 2021-12-08 ENCOUNTER — Ambulatory Visit: Payer: PPO | Admitting: Occupational Therapy

## 2021-12-08 ENCOUNTER — Other Ambulatory Visit (HOSPITAL_BASED_OUTPATIENT_CLINIC_OR_DEPARTMENT_OTHER): Payer: Self-pay

## 2021-12-08 DIAGNOSIS — R296 Repeated falls: Secondary | ICD-10-CM

## 2021-12-08 DIAGNOSIS — R41840 Attention and concentration deficit: Secondary | ICD-10-CM

## 2021-12-08 DIAGNOSIS — R2689 Other abnormalities of gait and mobility: Secondary | ICD-10-CM

## 2021-12-08 DIAGNOSIS — R278 Other lack of coordination: Secondary | ICD-10-CM

## 2021-12-08 DIAGNOSIS — M25512 Pain in left shoulder: Secondary | ICD-10-CM | POA: Diagnosis not present

## 2021-12-08 DIAGNOSIS — I69318 Other symptoms and signs involving cognitive functions following cerebral infarction: Secondary | ICD-10-CM

## 2021-12-08 NOTE — Therapy (Signed)
OUTPATIENT OCCUPATIONAL THERAPY TREATMENT NOTE   Patient Name: William [name] MRN: [id number] DOB:January 30, 1941, 81 y.o., male Today's Date: 12/08/2021  PCP: Roma Schanz, DO REFERRING PROVIDER: Carollee Herter, Alferd Apa, DO   OT End of Session - 12/08/21 1503     Visit Number 13    Number of Visits 13    Date for OT Re-Evaluation 12/14/21    Authorization Type [employer]    Authorization Time Period VL: [place]    Progress Note Due on Visit 10    OT Start Time 1455    OT Stop Time 1535    OT Time Calculation (min) 40 min    Activity Tolerance Patient tolerated treatment well    Behavior During Therapy Flat affect;Anxious;Restless            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   Mild, related to stroke history   Nocturia    Renal insufficiency 06/25/2013   S/P carotid endarterectomy    BILATERAL ICA--  PATENT PER DUPLEX  05-19-2012   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   Right calf   Urinary frequency    Vitamin D deficiency    Past Surgical History:  Procedure Laterality Date   APPENDECTOMY  AS CHILD   CARDIOVASCULAR STRESS TEST  03-27-2012  DR CRENSHAW   LOW RISK LEXISCAN STUDY-- PROBABLE NORMAL PERFUSION AND SOFT TISSUE ATTENUATION/  NO ISCHEMIA/ EF 51%   CAROTID ENDARTERECTOMY Bilateral LEFT  11-12-2008  DR GREG HAYES   RIGHT ICA   2006  ([facility])   CYSTOSCOPY W/ RETROGRADES Bilateral 06/22/2021   Procedure: CYSTOSCOPY WITH RETROGRADE PYELOGRAM;  Surgeon: Franchot Gallo, MD;  Location: [facility];  Service: Urology;  Laterality: Bilateral;   CYSTOSCOPY WITH LITHOLAPAXY N/A 02/26/2013   Procedure: CYSTOSCOPY WITH LITHOLAPAXY;  Surgeon: Franchot Gallo, MD;  Location: [facility];  Service: Urology;  Laterality: N/A;   EYE SURGERY  Jan. 2016   cataract surgery both eyes   INGUINAL HERNIA REPAIR Right 11-08-2006   IR KYPHO EA ADDL LEVEL THORACIC OR LUMBAR  02/12/2021   IR RADIOLOGIST EVAL & MGMT  02/18/2021   MASS EXCISION N/A 03/03/2016   Procedure: EXCISION OF BACK  MASS;  Surgeon: Stark Klein, MD;  Location: [place];  Service: General;  Laterality: N/A;   MOHS SURGERY Left 1/ 2016   Dr Nevada Crane-- Basal cell   PROSTATE SURGERY     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-C N/A 06/22/2021   Procedure: TRANSURETHRAL RESECTION OF BLADDER TUMOR;  Surgeon: Franchot Gallo, MD;  Location: [facility];  Service: Urology;  Laterality: N/A;   TRANSURETHRAL RESECTION OF PROSTATE N/A 02/26/2013   Procedure: TRANSURETHRAL RESECTION OF THE PROSTATE WITH GYRUS INSTRUMENTS;  Surgeon: Franchot Gallo, MD;  Location: [place];  Service: Urology;  Laterality: N/A;   TRANSURETHRAL RESECTION OF PROSTATE N/A 06/22/2021   Procedure: TRANSURETHRAL RESECTION OF THE PROSTATE (TURP);  Surgeon: Franchot Gallo, MD;  Location: [facility];  Service: Urology;  Laterality: N/A;   Patient Active Problem List   Diagnosis Date Noted   Acute pain of left shoulder 11/12/2021   Leukocytes in urine 11/12/2021   Urinary frequency 11/12/2021   Thrush 10/08/2021   Hemiplegia, dominant side S/P CVA (cerebrovascular accident) ([place]) 09/11/2021   Insomnia    Prediabetes    Chronic kidney disease (CKD), stage IV (severe) ([place])    Basal ganglia  infarction ([place]) 07/29/2021   Transaminitis 07/27/2021   UTI (urinary tract infection) 07/27/2021   CVA (cerebral vascular accident) ([place]) 07/27/2021   Fall at home, initial encounter 07/27/2021   Hyperglycemia 07/27/2021   CKD (chronic kidney disease), stage IV ([place]) 07/27/2021   Cholelithiasis 07/27/2021   Hypoxia 07/27/2021   Nausea and vomiting 50/93/2671   Acute metabolic encephalopathy 24/58/0998   Normocytic anemia 07/27/2021   Chronic back pain 07/27/2021   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[place]) 06/22/2021   Closed fracture of first lumbar vertebra with routine healing 02/03/2021   Closed fracture of multiple ribs 11/18/2020   Anxiety 01/29/2020   Leg pain, bilateral 01/29/2020   Ingrown toenail 07/13/2019   Lumbar spondylosis 05/02/2018   Pain in left knee 03/09/2018   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   Trochanteric bursitis of left hip 01/16/2018   Preventative health care 09/26/2017   HTN (hypertension) 07/19/2015   Hyperlipidemia 07/19/2015   Great toe pain 02/11/2014   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   Obesity (BMI 30-39.9) 06/25/2013   Renal insufficiency 06/25/2013   Weakness of left arm 06/25/2013   Sebaceous cyst 03/03/2011   Sprain of lumbar region 07/31/2010   Rib pain, left 08/29/2009   Carotid artery stenosis, asymptomatic, bilateral 05/02/2009   Eczema, atopic 05/31/2008   Vitamin D deficiency 03/01/2008   BPH (benign prostatic hyperplasia) 08/06/2007   Fasting hyperglycemia 12/21/2006   History of right MCA infarct 2006    ONSET DATE: 07/27/21  REFERRING DIAG: R41.4 (ICD-10-CM) - Hemi-inattention I63.9 (ICD-10-CM) - Cerebrovascular accident (CVA), unspecified mechanism ([place])   THERAPY DIAG:  Other abnormalities of gait and mobility  Other lack of coordination  Attention and concentration deficit  Repeated falls  Other symptoms and signs involving cognitive functions following cerebral  infarction  PERTINENT HISTORY: L basal ganglia and corona radiata infarct 07/27/21 (had documented AMS for about 5 days prior w/ hospital work-up including MRI negative and symptoms attributed to a UTI); L lower homonymous quadrantanopia. Other PMH includes R MCA infarct in 2006 w/ L-sided hemiparesis, NCD, carotid artery disease (bilateral), HTN, HLD,CKD3, and hx of bladder cancer  PRECAUTIONS: Fall risk  SUBJECTIVE:   SUBJECTIVE ASSESSMENT: Pt reports he had a cortisone injection in his L shoulder this morning; "it hurt"  PAIN: Are you having pain? Yes: NPRS scale: 7/10 Pain location: L shoulder Pain description: achy Aggravating factors: movement; received a shot today Relieving factors: rest   OBJECTIVE:   TODAY'S TREATMENT - 12/08/21: Functional transitions sitting in standard chair within // bars, practicing scooting toward L and R side, using RUE support to simulate passenger-side car transfer. Success improved w/ repetition and continued verbal cues/knowledge of results. Sitting on large therapy ball in // bars, focusing on body awareness, balance and righting reactions, and core strength; OT had pt maintain balance  in midline w/out UE support (also practiced self-correcting to midline w/ mirror as visual aid) while OT gently moved ball in different directions Weight shifting toward both R and L sides while standing in // bars and weight bearing through each UE; wb incorporated for facilitation of muscle activation, increased somatosensory input (particularly over affected LUE), and for increased awareness. OT incorporated significant verbal, visual, and tactile cues, especially when weight shifting toward L side Bed mobility: completed transition from sitting EOM to supine w/out HOB elevated; pt required SPV and step-by-step verbal cues for repositioning in all attempts. Transition from supine to return to seated position then completed using handheld assist, overall Min A; OT  provided caregiver education w/ pt's wife practicing allowing pt to pull himself up vs caregiver providing Max A w/ transition back to seated position   PATIENT EDUCATION: Ongoing caregiver education, particularly regarding safety w/ provision of transfer and transition assistance Person educated: Patient and Spouse Education method: Explanation Education comprehension: verbalized understanding   GOALS: Goals reviewed with patient? Yes   SHORT TERM GOALS: Target date: 10/23/2021   STG   Status:  1 Pt/caregiver will verbalize understanding of visual perceptual compensatory strategies Baseline: Decreased knowledge of visual deficits and strategies Progressing  2 Pt will complete symbol cancellation task w/ at least 50% accuracy using visual compensatory strategies to indicate improved attention Baseline: 40% accuracy w/ multiple errors Achieved - 11/17/21  3 Pt will participate in bilateral coordination activity w/ no more than 3 cues for incorporation of LUE Baseline: L-sided inattention Progressing      LONG TERM GOALS: Target date: 11/26/2021   LTG   Status:  1 Pt will be able to thread LUE into shirt sleeve w/ SPV in at least 1/2 trials Baseline: Min A to don/doff shirt sleeve on L side Progressing  2 Pt will be able to thread bottoms on BLEs w/ Mod A Baseline: Max A for LB dressing Progressing  3 Pt will demonstrate supine-to-sit transition w/ cues from caregiver and incorporation of AE/DME prn Baseline: Mod-Max A w/ bed mobility Met - 12/03/21; SPV w/ bed ladder and cues  4 Pt will be able to use TV remote (or adaptive remote) to complete 1 task (e.g., turn TV on/of, turn volume up/down) w/ SPV Baseline: Difficulty  Progressing  5 Pt will increase Barthel Index score by at least 5 to indicate improved level of participation in BADLs Baseline: 60/100 Progressing      ASSESSMENT:   CLINICAL IMPRESSION: Considering reported success w/ attempting activities on therapy ball, OT  incorporated this into session today, completing exercises in front of mirror and in // bars for UE support prn. Pt demonstrated slightly improved righting reactions w/out RUE support this session, but continues to demonstrate slightly leaning toward L side w/ pt reporting this is where he feels "in the middle." After body awareness, core strengthening, and weight shifting preparatory activities, OT attempted bed mobility w/ positive results.   PERFORMANCE DEFICITS in functional skills including ADLs, IADLs, coordination, dexterity, FMC, GMC, mobility, balance, and vision, cognitive skills including attention, memory, orientation, problem solving, safety awareness, sequencing, and temperament/personality, and psychosocial skills including environmental adaptation, interpersonal interactions, and routines and behaviors.    IMPAIRMENTS are limiting patient from ADLs, IADLs, rest and sleep, leisure, and social participation.    COMORBIDITIES may have co-morbidities that affects occupational performance. Patient will benefit from skilled OT to address above impairments and improve overall function.     PLAN: OT FREQUENCY: 2x/week  OT DURATION: 6 weeks   PLANNED INTERVENTIONS: self care/ADL training, therapeutic exercise, therapeutic activity, neuromuscular re-education, balance training, functional mobility training, aquatic therapy, splinting, moist heat, cryotherapy, patient/family education, cognitive remediation/compensation, visual/perceptual remediation/compensation, psychosocial skills training, energy conservation, and DME and/or AE instructions   RECOMMENDED OTHER SERVICES: Currently receiving PT/ST services at this location   CONSULTED AND AGREED WITH PLAN OF CARE: Patient and family member/caregiver   PLAN FOR NEXT SESSION: Complete recertification; continue to address L-sided attention, proprioception and kinesthetic sense   Kathrine Cords, MSOT, OTR/L 12/08/21, 4:14 PM

## 2021-12-09 ENCOUNTER — Encounter: Payer: Self-pay | Admitting: Physical Therapy

## 2021-12-09 ENCOUNTER — Ambulatory Visit (HOSPITAL_COMMUNITY): Payer: PPO | Admitting: Psychiatry

## 2021-12-09 ENCOUNTER — Other Ambulatory Visit (HOSPITAL_BASED_OUTPATIENT_CLINIC_OR_DEPARTMENT_OTHER): Payer: Self-pay

## 2021-12-09 ENCOUNTER — Ambulatory Visit: Payer: PPO | Admitting: Physical Therapy

## 2021-12-09 DIAGNOSIS — R262 Difficulty in walking, not elsewhere classified: Secondary | ICD-10-CM

## 2021-12-09 DIAGNOSIS — F0630 Mood disorder due to known physiological condition, unspecified: Secondary | ICD-10-CM | POA: Diagnosis not present

## 2021-12-09 DIAGNOSIS — M25512 Pain in left shoulder: Secondary | ICD-10-CM

## 2021-12-09 DIAGNOSIS — R2689 Other abnormalities of gait and mobility: Secondary | ICD-10-CM

## 2021-12-09 DIAGNOSIS — R296 Repeated falls: Secondary | ICD-10-CM

## 2021-12-09 DIAGNOSIS — I69354 Hemiplegia and hemiparesis following cerebral infarction affecting left non-dominant side: Secondary | ICD-10-CM

## 2021-12-09 DIAGNOSIS — R41840 Attention and concentration deficit: Secondary | ICD-10-CM | POA: Diagnosis not present

## 2021-12-09 DIAGNOSIS — M6281 Muscle weakness (generalized): Secondary | ICD-10-CM

## 2021-12-09 MED ORDER — LOREEV XR 1 MG PO CS24
1.0000 mg | EXTENDED_RELEASE_CAPSULE | Freq: Every day | ORAL | 3 refills | Status: DC
  Filled 2021-12-09: qty 10, 10d supply, fill #0
  Filled 2021-12-16: qty 20, 20d supply, fill #1
  Filled 2022-01-04 (×2): qty 20, 20d supply, fill #2
  Filled 2022-01-23: qty 20, 20d supply, fill #3

## 2021-12-09 MED ORDER — TRAZODONE HCL 150 MG PO TABS
75.0000 mg | ORAL_TABLET | Freq: Every day | ORAL | 0 refills | Status: DC
  Filled 2021-12-09 – 2021-12-12 (×2): qty 30, 60d supply, fill #0

## 2021-12-09 NOTE — Therapy (Signed)
[place] [place], [name], [address] Phone: [phone]   Fax:  [phone]  Physical Therapy Treatment  Patient Details  Name: William [name] MRN: [id number] Date of Birth: 03/03/1941 Referring Provider (PT): Supple   Encounter Date: 12/09/2021   PT End of Session - 12/09/21 1311     Visit Number 19    Date for PT Re-Evaluation 12/31/21    PT Start Time 1856    PT Stop Time 1350    PT Time Calculation (min) 45 min    Activity Tolerance Patient tolerated treatment well;No increased pain    Behavior During Therapy Flat affect;Anxious;Restless             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   Mild, related to stroke history   Nocturia    Renal insufficiency 06/25/2013   S/P carotid endarterectomy    BILATERAL ICA--  PATENT PER DUPLEX  05-19-2012   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   Right calf   Urinary frequency    Vitamin D deficiency     Past Surgical History:  Procedure Laterality Date   APPENDECTOMY  AS CHILD   CARDIOVASCULAR STRESS TEST  03-27-2012  DR CRENSHAW   LOW RISK LEXISCAN STUDY-- PROBABLE NORMAL PERFUSION AND SOFT TISSUE ATTENUATION/  NO ISCHEMIA/ EF 51%   CAROTID ENDARTERECTOMY Bilateral LEFT  11-12-2008  DR GREG HAYES   RIGHT ICA  2006  ([facility])    CYSTOSCOPY W/ RETROGRADES Bilateral 06/22/2021   Procedure: CYSTOSCOPY WITH RETROGRADE PYELOGRAM;  Surgeon: Franchot Gallo, MD;  Location: [facility];  Service: Urology;  Laterality: Bilateral;   CYSTOSCOPY WITH LITHOLAPAXY N/A 02/26/2013   Procedure: CYSTOSCOPY WITH LITHOLAPAXY;  Surgeon: Franchot Gallo, MD;  Location: [facility];  Service: Urology;  Laterality: N/A;   EYE SURGERY  Jan. 2016   cataract surgery both eyes   INGUINAL HERNIA REPAIR Right 11-08-2006   IR KYPHO EA ADDL LEVEL THORACIC OR LUMBAR  02/12/2021   IR RADIOLOGIST EVAL & MGMT  02/18/2021   MASS EXCISION N/A 03/03/2016   Procedure: EXCISION OF BACK  MASS;  Surgeon: Stark Klein, MD;  Location: [place];  Service: General;  Laterality: N/A;   MOHS SURGERY Left 1/ 2016   Dr Nevada Crane-- Basal cell   PROSTATE SURGERY     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-C N/A 06/22/2021   Procedure: TRANSURETHRAL RESECTION OF BLADDER TUMOR;  Surgeon: Franchot Gallo, MD;  Location: [facility];  Service: Urology;  Laterality: N/A;   TRANSURETHRAL RESECTION OF PROSTATE N/A 02/26/2013   Procedure: TRANSURETHRAL RESECTION OF THE PROSTATE WITH GYRUS INSTRUMENTS;  Surgeon: Franchot Gallo, MD;  Location: [facility];  Service: Urology;  Laterality: N/A;  TRANSURETHRAL RESECTION OF PROSTATE N/A 06/22/2021   Procedure: TRANSURETHRAL RESECTION OF THE PROSTATE (TURP);  Surgeon: Franchot Gallo, MD;  Location: [facility];  Service: Urology;  Laterality: N/A;    There were no vitals filed for this visit.   Subjective Assessment - 12/09/21 1311     Subjective Got injection in the left shoulder yesterday, reports that he feeels much better    Currently in Pain? No/denies                               [facility] Adult PT Treatment/Exercise - 12/09/21 0001       Transfers   Comments worked on moving side to side in  sitting, scooting forward, backward and each side, went out to the car and had him get in the car, then gave cues to scoot bottom over and he was able to do this better with some cues, seated on mat table side lean getting left elbow down and then going to right elbow down, some difficulty getting left elbow to touch.      High Level Balance   High Level Balance Comments seated on physioball in [place] working on pelvic mobility and balance had him reaching out, then had him react to me moving the ball for pelvic mobility and balance, ball kicks, cone toe touches      Lumbar Exercises: Seated   Long Arc Quad on Chair Left;3 sets;10 reps    LAQ on Chair Weights (lbs) 3    Other Seated Lumbar Exercises marching 2# 3x10                       PT Short Term Goals - 11/10/21 1415       PT SHORT TERM GOAL #1   Title independent with initial HEP    Time 2    Period Weeks    Status On-going               PT Long Term Goals - 12/02/21 1613       PT LONG TERM GOAL #1   Title Understand fall risks, the importance of exercise and flexibility to health and function    Status Partially Met      PT LONG TERM GOAL #2   Title decrease TUG time to 13 seconds    Status Partially Met                   Plan - 12/09/21 1448     Clinical Impression Statement Patient did great today, much better attention, better balance, he was able to do much better mobility, a month ago he could not have done some of the things I sw today, like righting reactions, bed mobility and his balance    PT Next Visit Plan continue to work on balance functional mobility, safety and treat shoulder for pain and ROM as we can    Consulted and Agree with Plan of Care Patient             Patient will benefit from skilled therapeutic intervention in order to improve the following deficits and impairments:  Abnormal gait, Decreased coordination, Decreased range of motion, Difficulty walking,  Increased muscle spasms, Cardiopulmonary status limiting activity, Decreased endurance, Decreased activity tolerance, Pain, Impaired flexibility, Decreased balance, Decreased mobility, Decreased strength, Postural dysfunction, Increased edema  Visit Diagnosis: Other abnormalities of gait and mobility  Repeated falls  Muscle weakness (generalized)  Difficulty in walking, not elsewhere classified  Hemiplegia and hemiparesis following cerebral infarction affecting left non-dominant side (HCC)  Acute pain of left shoulder     Problem List Patient Active Problem List   Diagnosis Date Noted   Acute pain of left shoulder 11/12/2021   Leukocytes in urine 11/12/2021   Urinary frequency 11/12/2021   Thrush 10/08/2021   Hemiplegia, dominant side S/P CVA (cerebrovascular accident) ([place]) 09/11/2021   Insomnia    Prediabetes    Chronic kidney disease (CKD), stage IV (severe) ([place])    Basal ganglia infarction ([place]) 07/29/2021   Transaminitis 07/27/2021   UTI (urinary tract infection) 07/27/2021   CVA (cerebral vascular accident) ([place]) 07/27/2021   Fall at home, initial encounter 07/27/2021   Hyperglycemia 07/27/2021   CKD (chronic kidney disease), stage IV ([place]) 07/27/2021   Cholelithiasis 07/27/2021   Hypoxia 07/27/2021   Nausea and vomiting 84/16/6063   Acute metabolic encephalopathy 01/60/1093   Normocytic anemia 07/27/2021   Chronic back pain 07/27/2021   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[place]) 06/22/2021   Closed fracture of first lumbar vertebra with routine healing 02/03/2021   Closed fracture of multiple ribs 11/18/2020   Anxiety 01/29/2020   Leg pain, bilateral 01/29/2020   Ingrown toenail 07/13/2019   Lumbar spondylosis 05/02/2018   Pain in left knee 03/09/2018   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   Trochanteric bursitis of left hip 01/16/2018   Preventative health care 09/26/2017   HTN (hypertension) 07/19/2015   Hyperlipidemia 07/19/2015   Great  toe pain 02/11/2014   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 [place], [name], [address] Phone: [phone]   Fax:  [phone]  Name: EDGER HUSAIN MRN: [id number] Date of Birth: 01-01-1941

## 2021-12-09 NOTE — Progress Notes (Signed)
Patient ID: William [name], male   DOB: December 07, 1940, 81 y.o.   MRN: [id number] [facility] MD Progress Note  12/09/2021 3:45 PM ELL TISO  MRN:  [id number] Subjective:  Feeling good Principal Problem: Mild neurocognitive disorde  Today the patient is seen with his wife William [name].  The patient seems to decline.  His doctor took him off of oral Tegretol and put him on liquid Tegretol.  They also adjusted his Ativan to taking 0.25 mg in the morning and 1 mg at night.  His wife shares that he is back to where he used to be and that he is tense and irritable and sometimes unexpectedly explosive.  He will do things like grab her hands and hold them too tight.  The patient is on edge.  He seems to be confused.  It seems that dementia process has increased.  He seems to be more cognitively impaired.  They adjusted his Tegretol to taking essentially 100 mg 3 times a day. 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   Mild, related to stroke history   Nocturia    Renal insufficiency 06/25/2013   S/P carotid endarterectomy    BILATERAL ICA--  PATENT PER DUPLEX  05-19-2012   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   Right calf   Urinary frequency    Vitamin D deficiency     Past Surgical History:  Procedure Laterality Date   APPENDECTOMY  AS CHILD   CARDIOVASCULAR STRESS TEST   03-27-2012  DR CRENSHAW   LOW RISK LEXISCAN STUDY-- PROBABLE NORMAL PERFUSION AND SOFT TISSUE ATTENUATION/  NO ISCHEMIA/ EF 51%   CAROTID ENDARTERECTOMY Bilateral LEFT  11-12-2008  DR GREG HAYES   RIGHT ICA  2006  ([facility])   CYSTOSCOPY W/ RETROGRADES Bilateral 06/22/2021   Procedure: CYSTOSCOPY WITH RETROGRADE PYELOGRAM;  Surgeon: Franchot Gallo, MD;  Location: [facility];  Service: Urology;  Laterality: Bilateral;   CYSTOSCOPY WITH LITHOLAPAXY N/A 02/26/2013   Procedure: CYSTOSCOPY WITH LITHOLAPAXY;  Surgeon: Franchot Gallo, MD;  Location: [facility];  Service: Urology;  Laterality: N/A;   EYE SURGERY  Jan. 2016   cataract surgery both eyes   INGUINAL HERNIA REPAIR Right 11-08-2006   IR KYPHO EA ADDL LEVEL THORACIC OR LUMBAR  02/12/2021   IR RADIOLOGIST EVAL & MGMT  02/18/2021   MASS EXCISION N/A 03/03/2016   Procedure: EXCISION OF BACK  MASS;  Surgeon: Stark Klein, MD;  Location: [place];  Service: General;  Laterality: N/A;   MOHS SURGERY Left 1/ 2016   Dr Nevada Crane-- Basal cell   PROSTATE SURGERY     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-C N/A 06/22/2021   Procedure: TRANSURETHRAL RESECTION OF BLADDER TUMOR;  Surgeon: Franchot Gallo, MD;  Location: [place];  Service: Urology;  Laterality: N/A;   TRANSURETHRAL RESECTION OF PROSTATE N/A 02/26/2013   Procedure: TRANSURETHRAL RESECTION OF THE PROSTATE WITH GYRUS INSTRUMENTS;  Surgeon: Franchot Gallo, MD;  Location: [facility];  Service: Urology;  Laterality: N/A;   TRANSURETHRAL RESECTION OF PROSTATE N/A 06/22/2021   Procedure: TRANSURETHRAL RESECTION OF THE PROSTATE (TURP);  Surgeon: Franchot Gallo, MD;  [facility];  Service: Urology;  Laterality: N/A;   Family History:  Family History  Problem Relation Age of Onset   Heart disease Mother        CHF   Bipolar disorder Mother    Heart disease Father         CHF   Family Psychiatric  History:  Social History:  Social History   Substance and Sexual Activity  Alcohol Use Yes   Alcohol/week: 0.0 standard drinks of alcohol   Comment: Occasional     Social History   Substance and Sexual Activity  Drug Use No    Social History   Socioeconomic History   Marital status: Married    Spouse name: Not on file   Number of children: 2   Years of education: 12   Highest education level: High school graduate  Occupational History    Employer: Retired  Tobacco Use   Smoking status: Former    Packs/day: 2.00    Years: 40.00    Total pack years: 80.00    Types: Cigarettes    Quit date: 02/15/2005    Years since quitting: 16.8   Smokeless tobacco: Never  Vaping Use   Vaping Use: Never used  Substance and Sexual Activity   Alcohol use: Yes    Alcohol/week: 0.0 standard drinks of alcohol    Comment: Occasional   Drug use: No   Sexual activity: Yes    Partners: Female  Other Topics Concern   Not on file  Social History Narrative   Exercise--  Walks dogs everyday      LIves with wife , no stairs in home, caffeine - one cup coffee day, exercise - not much, Right handed, 12th grade, retired      One story home   Social Determinants of Health   Financial Resource Strain: Not on file  Food Insecurity: Not on file  Transportation Needs: Not on file  Physical Activity: Not on file  Stress: Not on file  Social Connections: Not on file   Additional Social History:                         Sleep: Good  Appetite:  Good  Current Medications: Current Outpatient Medications  Medication Sig Dispense Refill   LORazepam ER (LOREEV XR) 1 MG CS24 Take 1 mg by mouth at bedtime. 30 capsule 3   acetaminophen (TYLENOL) 325 MG tablet Take 1-2 tablets (325-650 mg total) by mouth every 4 (four) hours as needed for mild pain.     amantadine (SYMMETREL) 100 MG capsule Take 1 capsule (100 mg total) by mouth daily. 30 capsule 0   amLODipine  (NORVASC) 10 MG tablet Take 1 tablet (10 mg total) by mouth every morning. 90 tablet 0   carBAMazepine (TEGRETOL) 100 MG/5ML suspension Take 5 mLs (100 mg total) by mouth 3 (three) times daily. 450 mL 12   Cholecalciferol (VITAMIN D) 50 MCG (2000 UT) CAPS Take 2,000 Units by mouth every morning.     clopidogrel (PLAVIX) 75 MG  tablet Take 1 tablet (75 mg total) by mouth daily. 30 tablet 0   fluticasone (CUTIVATE) 5.57 % cream 1 application daily as needed (psoriasis).  3   hydrALAZINE (APRESOLINE) 25 MG tablet Take 1 tablet (25 mg total) by mouth every 8 (eight) hours. 90 tablet 0   LORazepam (ATIVAN) 0.5 MG tablet Take 1/2 tablet (0.25 mg total) by mouth daily. 30 tablet 0   LORazepam (ATIVAN) 0.5 MG tablet Take 1/2 tablet by mouth every morning and take 1 tablet by mouth at bedtime 135 tablet 1   LORazepam (ATIVAN) 1 MG tablet Take 1 tablet (1 mg total) by mouth at bedtime. 30 tablet 1   Multiple Vitamins-Minerals (ADULT ONE DAILY GUMMIES) CHEW Chew 1 tablet by mouth every morning.     NONFORMULARY OR COMPOUNDED ITEM Lift chair   dx s/p cva 1 each 0   pantoprazole (PROTONIX) 40 MG tablet Take 1 tablet (40 mg total) by mouth daily. 90 tablet 3   pantoprazole sodium (PROTONIX) 40 mg Place 40 mg into feeding tube daily. 30 packet 3   polyethylene glycol (MIRALAX / GLYCOLAX) 17 g packet Take 17 g by mouth daily. 14 each 0   tamsulosin (FLOMAX) 0.4 MG CAPS capsule Take 1 capsule (0.4 mg total) by mouth daily after supper. 90 capsule 0   traMADol (ULTRAM) 50 MG tablet Take 1 tablet (50 mg total) by mouth every 12 (twelve) hours as needed (back pain). 30 tablet    traZODone (DESYREL) 150 MG tablet Take 1/2 tablet (75 mg total) by mouth at bedtime. 30 tablet 0   triamcinolone cream (KENALOG) 0.1 % Apply 1 application topically 2 (two) times daily. (Patient taking differently: Apply 1 application. topically 2 (two) times daily as needed (psoriasis).) 30 g 0   No current facility-administered medications  for this visit.   Facility-Administered Medications Ordered in Other Visits  Medication Dose Route Frequency Provider Last Rate Last Admin   gemcitabine (GEMZAR) chemo syringe for bladder instillation 2,000 mg  2,000 mg Bladder Instillation Once Franchot Gallo, MD        Lab Results:  No results found. However, due to the size of the patient record, not all encounters were searched. Please check Results Review for a complete set of results.  Physical Findings: AIMS:  , ,  ,  ,    CIWA:    COWS:     Musculoskeletal: Strength & Muscle Tone: within normal limits Gait & Station: normal Patient leans: Right  Psychiatric Specialty Exam: ROS  There were no vitals taken for this visit.There is no height or weight on file to calculate BMI.  General Appearance: Casual  Eye Contact::  Good  Speech:  Clear and Coherent  Volume:  Normal  Mood:  Dysphoric and Euthymic  Affect:  Appropriate  Thought Process:  Coherent  Orientation:  Full (Time, Place, and Person)  Thought Content:  WDL  Suicidal Thoughts:  No  Homicidal Thoughts:  No  Memory:  NA  Judgement:  Good  Insight:  Fair  Psychomotor Activity:  Normal  Concentration:  Good  Recall:  Good  Fund of Knowledge:Good  Language: Good  Akathisia:  No  Handed:  Right  AIMS (if indicated):     Assets:  Desire for Improvement  ADL's:  Intact  Cognition: WNL  Sleep:      Treatment Plan Summary: 12/09/2021, 3:45 PM    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  He is being treated for the irritability with Tegretol.  At this time he will continue taking the Tegretol we will get a blood level in the next few days.  We will adjust accordingly.  He has insomnia but does pretty well taking trazodone 75 mg at night.  His agitation is also somewhat controlled by Ativan taking 0.25 mg in the morning and 1 mg at night.  His wife however says he has problems  taking the 1 mg and will go to change to Loorev XR 1 mg.  This is a long-acting Ativan and a capsule that can be given in food.  This patient to return to see me in 6 weeks.  We get back to his Tegretol level we will consider adjusting by the phone this dose.

## 2021-12-10 ENCOUNTER — Telehealth (HOSPITAL_COMMUNITY): Payer: Self-pay

## 2021-12-10 ENCOUNTER — Encounter: Payer: Self-pay | Admitting: Family Medicine

## 2021-12-10 ENCOUNTER — Ambulatory Visit (INDEPENDENT_AMBULATORY_CARE_PROVIDER_SITE_OTHER): Payer: PPO | Admitting: Family Medicine

## 2021-12-10 ENCOUNTER — Ambulatory Visit: Payer: PPO | Admitting: Occupational Therapy

## 2021-12-10 ENCOUNTER — Other Ambulatory Visit: Payer: Self-pay | Admitting: Family Medicine

## 2021-12-10 ENCOUNTER — Other Ambulatory Visit (HOSPITAL_BASED_OUTPATIENT_CLINIC_OR_DEPARTMENT_OTHER): Payer: Self-pay

## 2021-12-10 ENCOUNTER — Encounter: Payer: Self-pay | Admitting: Occupational Therapy

## 2021-12-10 ENCOUNTER — Ambulatory Visit: Payer: PPO | Admitting: Speech Pathology

## 2021-12-10 VITALS — BP 120/80 | HR 73 | Temp 97.8°F | Resp 18 | Ht 71.0 in | Wt 190.0 lb

## 2021-12-10 DIAGNOSIS — I10 Essential (primary) hypertension: Secondary | ICD-10-CM | POA: Diagnosis not present

## 2021-12-10 DIAGNOSIS — R2689 Other abnormalities of gait and mobility: Secondary | ICD-10-CM

## 2021-12-10 DIAGNOSIS — R21 Rash and other nonspecific skin eruption: Secondary | ICD-10-CM | POA: Diagnosis not present

## 2021-12-10 DIAGNOSIS — R278 Other lack of coordination: Secondary | ICD-10-CM

## 2021-12-10 DIAGNOSIS — E785 Hyperlipidemia, unspecified: Secondary | ICD-10-CM

## 2021-12-10 DIAGNOSIS — Z79899 Other long term (current) drug therapy: Secondary | ICD-10-CM | POA: Diagnosis not present

## 2021-12-10 DIAGNOSIS — R41840 Attention and concentration deficit: Secondary | ICD-10-CM | POA: Diagnosis not present

## 2021-12-10 DIAGNOSIS — I639 Cerebral infarction, unspecified: Secondary | ICD-10-CM

## 2021-12-10 DIAGNOSIS — I69318 Other symptoms and signs involving cognitive functions following cerebral infarction: Secondary | ICD-10-CM

## 2021-12-10 DIAGNOSIS — Z8673 Personal history of transient ischemic attack (TIA), and cerebral infarction without residual deficits: Secondary | ICD-10-CM | POA: Diagnosis not present

## 2021-12-10 DIAGNOSIS — Z23 Encounter for immunization: Secondary | ICD-10-CM

## 2021-12-10 DIAGNOSIS — Z9181 History of falling: Secondary | ICD-10-CM

## 2021-12-10 DIAGNOSIS — R7309 Other abnormal glucose: Secondary | ICD-10-CM

## 2021-12-10 LAB — COMPREHENSIVE METABOLIC PANEL
ALT: 117 U/L — ABNORMAL HIGH (ref 0–53)
AST: 58 U/L — ABNORMAL HIGH (ref 0–37)
Albumin: 4 g/dL (ref 3.5–5.2)
Alkaline Phosphatase: 584 U/L — ABNORMAL HIGH (ref 39–117)
BUN: 33 mg/dL — ABNORMAL HIGH (ref 6–23)
CO2: 27 mEq/L (ref 19–32)
Calcium: 9.4 mg/dL (ref 8.4–10.5)
Chloride: 105 mEq/L (ref 96–112)
Creatinine, Ser: 1.7 mg/dL — ABNORMAL HIGH (ref 0.40–1.50)
GFR: 37.9 mL/min — ABNORMAL LOW (ref >60.00)
Glucose, Bld: 93 mg/dL (ref 70–99)
Potassium: 4.1 mEq/L (ref 3.5–5.1)
Sodium: 140 mEq/L (ref 135–145)
Total Bilirubin: 0.4 mg/dL (ref 0.2–1.2)
Total Protein: 6.7 g/dL (ref 6.0–8.3)

## 2021-12-10 LAB — CBC WITH DIFFERENTIAL/PLATELET
Basophils Absolute: 0.1 10*3/uL (ref 0.0–0.1)
Basophils Relative: 0.8 % (ref 0.0–3.0)
Eosinophils Absolute: 0.1 10*3/uL (ref 0.0–0.7)
Eosinophils Relative: 1.4 % (ref 0.0–5.0)
HCT: 35.5 % — ABNORMAL LOW (ref 39.0–52.0)
Hemoglobin: 11.8 g/dL — ABNORMAL LOW (ref 13.0–17.0)
Lymphocytes Relative: 22.2 % (ref 12.0–46.0)
Lymphs Abs: 1.6 10*3/uL (ref 0.7–4.0)
MCHC: 33.1 g/dL (ref 30.0–36.0)
MCV: 96.6 fl (ref 78.0–100.0)
Monocytes Absolute: 0.6 10*3/uL (ref 0.1–1.0)
Monocytes Relative: 8.9 % (ref 3.0–12.0)
Neutro Abs: 4.9 10*3/uL (ref 1.4–7.7)
Neutrophils Relative %: 66.7 % (ref 43.0–77.0)
Platelets: 217 10*3/uL (ref 150.0–400.0)
RBC: 3.68 Mil/uL — ABNORMAL LOW (ref 4.22–5.81)
RDW: 15.9 % — ABNORMAL HIGH (ref 11.5–15.5)
WBC: 7.3 10*3/uL (ref 4.0–10.5)

## 2021-12-10 LAB — LIPID PANEL
Cholesterol: 202 mg/dL — ABNORMAL HIGH (ref 0–200)
HDL: 63 mg/dL (ref >39.00)
LDL Cholesterol: 120 mg/dL — ABNORMAL HIGH (ref 0–99)
NonHDL: 139.32
Total CHOL/HDL Ratio: 3
Triglycerides: 95 mg/dL (ref 0.0–149.0)
VLDL: 19 mg/dL (ref 0.0–40.0)

## 2021-12-10 MED ORDER — TETANUS-DIPHTH-ACELL PERTUSSIS 5-2.5-18.5 LF-MCG/0.5 IM SUSP
0.5000 mL | Freq: Once | INTRAMUSCULAR | 0 refills | Status: AC
  Filled 2021-12-10: qty 0.5, 1d supply, fill #0

## 2021-12-10 MED ORDER — CLOPIDOGREL BISULFATE 75 MG PO TABS
75.0000 mg | ORAL_TABLET | Freq: Every day | ORAL | 0 refills | Status: DC
  Filled 2021-12-10: qty 30, 30d supply, fill #0

## 2021-12-10 MED ORDER — CLOTRIMAZOLE-BETAMETHASONE 1-0.05 % EX CREA
1.0000 | TOPICAL_CREAM | Freq: Every day | CUTANEOUS | 0 refills | Status: DC
  Filled 2021-12-10: qty 30, 30d supply, fill #0

## 2021-12-10 MED ORDER — TETANUS-DIPHTH-ACELL PERTUSSIS 5-2.5-18.5 LF-MCG/0.5 IM SUSY
0.5000 mL | PREFILLED_SYRINGE | Freq: Once | INTRAMUSCULAR | 0 refills | Status: AC
  Filled 2021-12-10: qty 0.5, 1d supply, fill #0

## 2021-12-10 NOTE — Assessment & Plan Note (Signed)
Encourage heart healthy diet such as MIND or DASH diet, increase exercise, avoid trans fats, simple carbohydrates and processed foods, consider a krill or fish or flaxseed oil cap daily.  °

## 2021-12-10 NOTE — Assessment & Plan Note (Signed)
Check labs 

## 2021-12-10 NOTE — Assessment & Plan Note (Signed)
Well controlled, no changes to meds. Encouraged heart healthy diet such as the DASH diet and exercise as tolerated.  °

## 2021-12-10 NOTE — Telephone Encounter (Signed)
Medication management - Prior authorization submitted online with CoverMyMeds for patient's [employer] for his Loreev XR 1 mg.  Prior authorization pending.

## 2021-12-10 NOTE — Progress Notes (Signed)
Established Patient Office Visit  Subjective   Patient ID: William [name], male    DOB: 1941-02-19  Age: 81 y.o. MRN: [id number]  Chief Complaint  Patient presents with   Hypertension   Hyperlipidemia   Cerebrovascular Accident   Follow-up    HPI Pt is here with her husband to f/u chol, bp and cva.  Pt is doing well -- he saw dr supple and had an injection in his shoulder and feels much better ---   he is walking better.  No wheelchair today.    Patient Active Problem List   Diagnosis Date Noted   Acute pain of left shoulder 11/12/2021   Leukocytes in urine 11/12/2021   Urinary frequency 11/12/2021   Thrush 10/08/2021   Hemiplegia, dominant side S/P CVA (cerebrovascular accident) ([place]) 09/11/2021   Insomnia    Prediabetes    Chronic kidney disease (CKD), stage IV (severe) ([place])    Basal ganglia infarction ([place]) 07/29/2021   Transaminitis 07/27/2021   UTI (urinary tract infection) 07/27/2021   CVA (cerebral vascular accident) ([place]) 07/27/2021   Fall at home, initial encounter 07/27/2021   Hyperglycemia 07/27/2021   CKD (chronic kidney disease), stage IV ([place]) 07/27/2021   Cholelithiasis 07/27/2021   Hypoxia 07/27/2021   Nausea and vomiting 66/11/3014   Acute metabolic encephalopathy 06/22/3233   Normocytic anemia 07/27/2021   Chronic back pain 07/27/2021   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[place]) 06/22/2021   Closed fracture of first lumbar vertebra with routine healing 02/03/2021   Closed fracture of multiple ribs 11/18/2020   Anxiety 01/29/2020   Leg pain, bilateral 01/29/2020   Ingrown toenail 07/13/2019   Lumbar spondylosis 05/02/2018   Pain in left knee 03/09/2018   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   Trochanteric bursitis of left hip 01/16/2018   Preventative health care 09/26/2017   HTN (hypertension) 07/19/2015   Hyperlipidemia 07/19/2015   Great toe pain 02/11/2014   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   Obesity (BMI  30-39.9) 06/25/2013   Renal insufficiency 06/25/2013   Weakness of left arm 06/25/2013   Sebaceous cyst 03/03/2011   Sprain of lumbar region 07/31/2010   Rib pain, left 08/29/2009   Carotid artery stenosis, asymptomatic, bilateral 05/02/2009   Eczema, atopic 05/31/2008   Vitamin D deficiency 03/01/2008   BPH (benign prostatic hyperplasia) 08/06/2007   Fasting hyperglycemia 12/21/2006   History of right MCA infarct 2006   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   Mild, related to stroke history   Nocturia    Renal insufficiency 06/25/2013   S/P carotid endarterectomy    BILATERAL ICA--  PATENT PER DUPLEX  05-19-2012   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   Right calf   Urinary frequency    Vitamin D deficiency  Past Surgical History:  Procedure Laterality Date   APPENDECTOMY  AS CHILD   CARDIOVASCULAR STRESS TEST  03-27-2012  DR CRENSHAW   LOW RISK LEXISCAN STUDY-- PROBABLE NORMAL PERFUSION AND SOFT TISSUE ATTENUATION/  NO ISCHEMIA/ EF 51%   CAROTID ENDARTERECTOMY Bilateral LEFT  11-12-2008  DR GREG HAYES   RIGHT ICA  2006  ([facility])   CYSTOSCOPY W/ RETROGRADES Bilateral 06/22/2021   Procedure: CYSTOSCOPY WITH RETROGRADE PYELOGRAM;  Surgeon: Franchot Gallo, MD;  [facility];  Service: Urology;  Laterality: Bilateral;   CYSTOSCOPY  WITH LITHOLAPAXY N/A 02/26/2013   Procedure: CYSTOSCOPY WITH LITHOLAPAXY;  Surgeon: Franchot Gallo, MD;  Location[facility];  Service: Urology;  Laterality: N/A;   EYE SURGERY  Jan. 2016   cataract surgery both eyes   INGUINAL HERNIA REPAIR Right 11-08-2006   IR KYPHO EA ADDL LEVEL THORACIC OR LUMBAR  02/12/2021   IR RADIOLOGIST EVAL & MGMT  02/18/2021   MASS EXCISION N/A 03/03/2016   Procedure: EXCISION OF BACK  MASS;  Surgeon: Stark Klein, MD;  Location: [facility];  Service: General;  Laterality: N/A;   MOHS SURGERY Left 1/ 2016   Dr Nevada Crane-- Basal cell   PROSTATE SURGERY     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-C N/A 06/22/2021   Procedure: TRANSURETHRAL RESECTION OF BLADDER TUMOR;  Surgeon: Franchot Gallo, MD;  Location: [facility];  Service: Urology;  Laterality: N/A;   TRANSURETHRAL RESECTION OF PROSTATE N/A 02/26/2013   Procedure: TRANSURETHRAL RESECTION OF THE PROSTATE WITH GYRUS INSTRUMENTS;  Surgeon: Franchot Gallo, MD;  [facility];  Service: Urology;  Laterality: N/A;   TRANSURETHRAL RESECTION OF PROSTATE N/A 06/22/2021   Procedure: TRANSURETHRAL RESECTION OF THE PROSTATE (TURP);  Surgeon: Franchot Gallo, MD;  Location: [facility];  Service: Urology;  Laterality: N/A;   Social History   Tobacco Use   Smoking status: Former    Packs/day: 2.00    Years: 40.00    Total pack years: 80.00    Types: Cigarettes    Quit date: 02/15/2005    Years since quitting: 16.8   Smokeless tobacco: Never  Vaping Use   Vaping Use: Never used  Substance Use Topics   Alcohol use: Yes    Alcohol/week: 0.0 standard drinks of alcohol    Comment: Occasional   Drug use: No   Social History   Socioeconomic History   Marital status: Married    Spouse name: Not on file   Number of children: 2   Years of education: 12   Highest education level: High school graduate  Occupational  History    Employer: Retired  Tobacco Use   Smoking status: Former    Packs/day: 2.00    Years: 40.00    Total pack years: 80.00    Types: Cigarettes    Quit date: 02/15/2005    Years since quitting: 16.8   Smokeless tobacco: Never  Vaping Use   Vaping Use: Never used  Substance and Sexual Activity   Alcohol use: Yes    Alcohol/week: 0.0 standard drinks of alcohol    Comment: Occasional   Drug use: No   Sexual activity: Yes    Partners: Female  Other Topics Concern   Not on file  Social History Narrative   Exercise--  Walks dogs everyday      LIves with wife , no stairs in home, caffeine - one cup coffee day, exercise - not much, Right  handed, 12th grade, retired      One story home   Social Determinants of Radio broadcast assistant Strain: Not on file  Food Insecurity: Not on file  Transportation Needs: Not on file  Physical Activity: Not on file  Stress: Not on file  Social Connections: Not on file  Intimate Partner Violence: Not on file   Family Status  Relation Name Status   Mother  Deceased at age 97   Father  Deceased at age 6   Sister  Alive   Sister  Alive   Sister  Alive   Family History  Problem Relation Age of Onset   Heart disease Mother        CHF   Bipolar disorder Mother    Heart disease Father        CHF   Allergies  Allergen Reactions   Bee Venom Anaphylaxis   Strawberry Extract Hives   Latex Itching   Adhesive [Tape] Other (See Comments)    BLISTER   Statins Other (See Comments)    myalgias      ROS    Objective:     BP 120/80 (BP Location: Left Arm, Patient Position: Sitting, Cuff Size: Normal)   Pulse 73   Temp 97.8 F (36.6 C) (Oral)   Resp 18   Ht '5\' 11"'$  (1.803 m)   Wt 190 lb (86.2 kg)   SpO2 96%   BMI 26.50 kg/m  BP Readings from Last 3 Encounters:  12/10/21 120/80  11/12/21 132/80  11/07/21 136/68   Wt Readings from Last 3 Encounters:  12/10/21 190 lb (86.2 kg)  10/08/21 200 lb 3.2 oz (90.8 kg)   08/27/21 206 lb (93.4 kg)   SpO2 Readings from Last 3 Encounters:  12/10/21 96%  11/12/21 97%  11/07/21 100%      Physical Exam   Results for orders placed or performed in visit on 12/10/21  Comprehensive metabolic panel  Result Value Ref Range   Sodium 140 135 - 145 mEq/L   Potassium 4.1 3.5 - 5.1 mEq/L   Chloride 105 96 - 112 mEq/L   CO2 27 19 - 32 mEq/L   Glucose, Bld 93 70 - 99 mg/dL   BUN 33 (H) 6 - 23 mg/dL   Creatinine, Ser 1.70 (H) 0.40 - 1.50 mg/dL   Total Bilirubin 0.4 0.2 - 1.2 mg/dL   Alkaline Phosphatase 584 (H) 39 - 117 U/L   AST 58 (H) 0 - 37 U/L   ALT 117 (H) 0 - 53 U/L   Total Protein 6.7 6.0 - 8.3 g/dL   Albumin 4.0 3.5 - 5.2 g/dL   GFR 37.90 (L) >60.00 mL/min   Calcium 9.4 8.4 - 10.5 mg/dL  CBC with Differential/Platelet  Result Value Ref Range   WBC 7.3 4.0 - 10.5 K/uL   RBC 3.68 (L) 4.22 - 5.81 Mil/uL   Hemoglobin 11.8 (L) 13.0 - 17.0 g/dL   HCT 35.5 (L) 39.0 - 52.0 %   MCV 96.6 78.0 - 100.0 fl   MCHC 33.1 30.0 - 36.0 g/dL   RDW 15.9 (H) 11.5 - 15.5 %   Platelets 217.0 150.0 - 400.0 K/uL   Neutrophils Relative % 66.7 43.0 - 77.0 %   Lymphocytes Relative 22.2 12.0 - 46.0 %   Monocytes Relative 8.9 3.0 - 12.0 %   Eosinophils Relative 1.4 0.0 - 5.0 %   Basophils Relative 0.8 0.0 - 3.0 %   Neutro Abs 4.9 1.4 - 7.7 K/uL  Lymphs Abs 1.6 0.7 - 4.0 K/uL   Monocytes Absolute 0.6 0.1 - 1.0 K/uL   Eosinophils Absolute 0.1 0.0 - 0.7 K/uL   Basophils Absolute 0.1 0.0 - 0.1 K/uL  Lipid panel  Result Value Ref Range   Cholesterol 202 (H) 0 - 200 mg/dL   Triglycerides 95.0 0.0 - 149.0 mg/dL   HDL 63.00 >39.00 mg/dL   VLDL 19.0 0.0 - 40.0 mg/dL   LDL Cholesterol 120 (H) 0 - 99 mg/dL   Total CHOL/HDL Ratio 3    NonHDL 139.32     Last CBC Lab Results  Component Value Date   WBC 7.3 12/10/2021   HGB 11.8 (L) 12/10/2021   HCT 35.5 (L) 12/10/2021   MCV 96.6 12/10/2021   MCH 31.7 10/02/2021   RDW 15.9 (H) 12/10/2021   PLT 217.0 28/00/3491    Last metabolic panel Lab Results  Component Value Date   GLUCOSE 93 12/10/2021   NA 140 12/10/2021   K 4.1 12/10/2021   CL 105 12/10/2021   CO2 27 12/10/2021   BUN 33 (H) 12/10/2021   CREATININE 1.70 (H) 12/10/2021   GFRNONAA 37 (L) 10/02/2021   CALCIUM 9.4 12/10/2021   PHOS 3.1 12/24/2016   PROT 6.7 12/10/2021   ALBUMIN 4.0 12/10/2021   LABGLOB 3.2 04/25/2019   AGRATIO 1.3 04/25/2019   BILITOT 0.4 12/10/2021   ALKPHOS 584 (H) 12/10/2021   AST 58 (H) 12/10/2021   ALT 117 (H) 12/10/2021   ANIONGAP 10 10/02/2021   Last lipids Lab Results  Component Value Date   CHOL 202 (H) 12/10/2021   HDL 63.00 12/10/2021   LDLCALC 120 (H) 12/10/2021   TRIG 95.0 12/10/2021   CHOLHDL 3 12/10/2021   Last hemoglobin A1c Lab Results  Component Value Date   HGBA1C 5.3 07/27/2021   Last thyroid functions Lab Results  Component Value Date   TSH 0.555 07/27/2021   Last vitamin D Lab Results  Component Value Date   VD25OH 37.98 07/30/2021   Last vitamin B12 and Folate Lab Results  Component Value Date   VITAMINB12 720 10/16/2013   FOLATE 19.5 05/02/2009      The ASCVD Risk score (Arnett DK, et al., 2019) failed to calculate for the following reasons:   The patient has a prior MI or stroke diagnosis    Assessment & Plan:   Problem List Items Addressed This Visit       Unprioritized   Hyperlipidemia    Encourage heart healthy diet such as MIND or DASH diet, increase exercise, avoid trans fats, simple carbohydrates and processed foods, consider a krill or fish or flaxseed oil cap daily.       Relevant Orders   Comprehensive metabolic panel (Completed)   CBC with Differential/Platelet (Completed)   Lipid panel (Completed)   Carbamazepine Level (Tegretol), total   HTN (hypertension) - Primary    Well controlled, no changes to meds. Encouraged heart healthy diet such as the DASH diet and exercise as tolerated.       Relevant Orders   Comprehensive metabolic panel  (Completed)   CBC with Differential/Platelet (Completed)   Lipid panel (Completed)   Carbamazepine Level (Tegretol), total   Fasting hyperglycemia    Check labs       CVA (cerebral vascular accident) [facility])    F/u neuro      Other Visit Diagnoses     High risk medication use       Relevant Orders   Comprehensive metabolic panel (Completed)  CBC with Differential/Platelet (Completed)   Lipid panel (Completed)   Carbamazepine Level (Tegretol), total   History of cardioembolic cerebrovascular accident (CVA)       Relevant Orders   Comprehensive metabolic panel (Completed)   CBC with Differential/Platelet (Completed)   Lipid panel (Completed)   Carbamazepine Level (Tegretol), total   Rash       Relevant Medications   clotrimazole-betamethasone (LOTRISONE) cream   Need for tetanus booster       Relevant Medications   Tdap (BOOSTRIX) 5-2.5-18.5 LF-MCG/0.5 injection   Tdap ([place]) 5-2.5-18.5 LF-MCG/0.5 injection       Return in about 6 months (around 06/11/2022), or if symptoms worsen or fail to improve, for annual exam, fasting.    Ann Held, DO

## 2021-12-10 NOTE — Therapy (Signed)
OUTPATIENT OCCUPATIONAL THERAPY TREATMENT NOTE & RECERTIFICATION   Patient Name: William [name] MRN: [id number] DOB:1941-02-22, 81 y.o., male Today's Date: 12/10/2021  PCP: Roma Schanz, DO REFERRING PROVIDER: Carollee Herter, Alferd Apa, DO   OT End of Session - 12/10/21 1536     Visit Number 14    Number of Visits 13    Date for OT Re-Evaluation 12/14/21    Authorization Type [employer]    Authorization Time Period VL: [place]    Progress Note Due on Visit 10    OT Start Time 1530    OT Stop Time 1615    OT Time Calculation (min) 45 min    Activity Tolerance Patient tolerated treatment well    Behavior During Therapy Flat affect;Anxious;Restless            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   Mild, related to stroke history   Nocturia    Renal insufficiency 06/25/2013   S/P carotid endarterectomy    BILATERAL ICA--  PATENT PER DUPLEX  05-19-2012   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   Right calf   Urinary frequency    Vitamin D deficiency    Past Surgical History:  Procedure Laterality Date   APPENDECTOMY  AS CHILD   CARDIOVASCULAR STRESS TEST  03-27-2012  DR CRENSHAW   LOW RISK LEXISCAN STUDY-- PROBABLE NORMAL PERFUSION AND SOFT TISSUE ATTENUATION/  NO ISCHEMIA/ EF 51%   CAROTID ENDARTERECTOMY Bilateral LEFT  11-12-2008  DR GREG  HAYES   RIGHT ICA  2006  ([facility])   CYSTOSCOPY W/ RETROGRADES Bilateral 06/22/2021   Procedure: CYSTOSCOPY WITH RETROGRADE PYELOGRAM;  Surgeon: Franchot Gallo, MD;  [facility];  Service: Urology;  Laterality: Bilateral;   CYSTOSCOPY WITH LITHOLAPAXY N/A 02/26/2013   Procedure: CYSTOSCOPY WITH LITHOLAPAXY;  Surgeon: Franchot Gallo, MD;  Location: [facility];  Service: Urology;  Laterality: N/A;   EYE SURGERY  Jan. 2016   cataract surgery both eyes   INGUINAL HERNIA REPAIR Right 11-08-2006   IR KYPHO EA ADDL LEVEL THORACIC OR LUMBAR  02/12/2021   IR RADIOLOGIST EVAL & MGMT  02/18/2021   MASS EXCISION N/A 03/03/2016   Procedure: EXCISION OF BACK  MASS;  Surgeon: Stark Klein, MD;  Location: [facility];  Service: General;  Laterality: N/A;   MOHS SURGERY Left 1/ 2016   Dr Nevada Crane-- Basal cell   PROSTATE SURGERY     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-C N/A 06/22/2021   Procedure: TRANSURETHRAL RESECTION OF BLADDER TUMOR;  Surgeon: Franchot Gallo, MD;  Location: [facility];  Service: Urology;  Laterality: N/A;   TRANSURETHRAL RESECTION OF PROSTATE N/A 02/26/2013   Procedure: TRANSURETHRAL RESECTION OF THE PROSTATE WITH GYRUS INSTRUMENTS;  Surgeon: Annie Main  Dahlstedt, MD;  Location: [facility];  Service: Urology;  Laterality: N/A;   TRANSURETHRAL RESECTION OF PROSTATE N/A 06/22/2021   Procedure: TRANSURETHRAL RESECTION OF THE PROSTATE (TURP);  Surgeon: Franchot Gallo, MD;  Location: [facility];  Service: Urology;  Laterality: N/A;   Patient Active Problem List   Diagnosis Date Noted   Acute pain of left shoulder 11/12/2021   Leukocytes in urine 11/12/2021   Urinary frequency 11/12/2021   Thrush 10/08/2021   Hemiplegia, dominant side S/P CVA (cerebrovascular accident) ([place]) 09/11/2021   Insomnia    Prediabetes    Chronic kidney disease (CKD), stage IV (severe) ([place])     Basal ganglia infarction ([place]) 07/29/2021   Transaminitis 07/27/2021   UTI (urinary tract infection) 07/27/2021   CVA (cerebral vascular accident) ([place]) 07/27/2021   Fall at home, initial encounter 07/27/2021   Hyperglycemia 07/27/2021   CKD (chronic kidney disease), stage IV ([place]) 07/27/2021   Cholelithiasis 07/27/2021   Hypoxia 07/27/2021   Nausea and vomiting 43/15/4008   Acute metabolic encephalopathy 67/61/9509   Normocytic anemia 07/27/2021   Chronic back pain 07/27/2021   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[place]) 06/22/2021   Closed fracture of first lumbar vertebra with routine healing 02/03/2021   Closed fracture of multiple ribs 11/18/2020   Anxiety 01/29/2020   Leg pain, bilateral 01/29/2020   Ingrown toenail 07/13/2019   Lumbar spondylosis 05/02/2018   Pain in left knee 03/09/2018   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   Trochanteric bursitis of left hip 01/16/2018   Preventative health care 09/26/2017   HTN (hypertension) 07/19/2015   Hyperlipidemia 07/19/2015   Great toe pain 02/11/2014   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   Obesity (BMI 30-39.9) 06/25/2013   Renal insufficiency 06/25/2013   Weakness of left arm 06/25/2013   Sebaceous cyst 03/03/2011   Sprain of lumbar region 07/31/2010   Rib pain, left 08/29/2009   Carotid artery stenosis, asymptomatic, bilateral 05/02/2009   Eczema, atopic 05/31/2008   Vitamin D deficiency 03/01/2008   BPH (benign prostatic hyperplasia) 08/06/2007   Fasting hyperglycemia 12/21/2006   History of right MCA infarct 2006    ONSET DATE: 07/27/21  REFERRING DIAG: R41.4 (ICD-10-CM) - Hemi-inattention I63.9 (ICD-10-CM) - Cerebrovascular accident (CVA), unspecified mechanism ([place])   THERAPY DIAG:  Attention and concentration deficit  Other abnormalities of gait and mobility  Other lack of coordination  History of falling  Other symptoms and signs involving cognitive functions following  cerebral infarction  PERTINENT HISTORY: L basal ganglia and corona radiata infarct 07/27/21 (had documented AMS for about 5 days prior w/ hospital work-up including MRI negative and symptoms attributed to a UTI); L lower homonymous quadrantanopia. Other PMH includes R MCA infarct in 2006 w/ L-sided hemiparesis, NCD, carotid artery disease (bilateral), HTN, HLD,CKD3, and hx of bladder cancer  PRECAUTIONS: Fall risk  SUBJECTIVE:   SUBJECTIVE ASSESSMENT: "[name] is a little confused today;" pt's wife reports he was fixated on driving himself to therapy session today and it took a lot of effort to get him out of the car. Pt also stated that his L shoulder feels "a lot better."  PAIN: Are you having pain? No   OBJECTIVE:   TODAY'S TREATMENT - 12/10/21: Midline in front of mirror; leaning to L and right to tap targets w/ elbows Lateral flexion while seated against OT resistance 2 kg med ball forward to tabletop; HOHA for LUE incorporation Rolling ball w/ LUE on tabletop   PATIENT EDUCATION: Ongoing caregiver  education; discussed POC considering recertification date Person educated: Patient and Spouse Education method: Explanation Education comprehension: verbalized understanding   GOALS: Goals reviewed with patient? Yes   SHORT TERM GOALS: Target date:    STG   Status:  1 Pt/caregiver will verbalize understanding of visual perceptual compensatory strategies Baseline: Decreased knowledge of visual deficits and strategies Progressing  2 Pt will complete symbol cancellation task w/ at least 50% accuracy using visual compensatory strategies to indicate improved attention Baseline: 40% accuracy w/ multiple errors Achieved - 11/17/21  3 Pt will participate in bilateral coordination activity w/ no more than 3 cues for incorporation of LUE Baseline: L-sided inattention Progressing      LONG TERM GOALS: Target date: 11/26/2021   LTG   Status:  1 Pt will be able to don overhead  shirt w/ SPV and verbal/gestural cues prn in at least 1 attempt Baseline: Min A to don/doff shirt sleeve on L side Revised 12/11/21  2 Pt will be able to thread bottoms on BLEs or don socks w/ Mod A Baseline: Max A for LB dressing Revised 12/01/21  3 Pt will demonstrate supine-to-sit transition w/ cues from caregiver and incorporation of AE/DME prn Baseline: Mod-Max A w/ bed mobility Met - 12/03/21; SPV w/ bed ladder and cues  4 Pt will be able to use TV remote (or adaptive remote) to complete 1 task (e.g., turn TV on/of, turn volume up/down) w/ SPV Baseline: Difficulty  Not met  5 Pt will increase Barthel Index score by at least 5 to indicate improved level of participation in BADLs Baseline: 55/100 Progressing  6 Pt will be able to make a peanut butter and jelly sandwich w/ SPV and verbal/tactile cues prn Initial     ASSESSMENT:   CLINICAL IMPRESSION: OT reassessed progress toward goals and facilitated thorough discussion related to pt's participation in functional activities for completion of recertification this session. Pt has demonstrated improvements in [place], functional transitions and steadiness on feet, and processing speed. Limitations w/ L-sided awareness, proprioception/kinesthetic sense, LUE GMC/ROM/strength, and cognitive deficits continue to hinder safety and participation in functional tasks. Pt will continue to benefit from OP OT services to address aforementioned deficits and continue to increase participation in daily activities for improved overall quality of life and reduced caregiver strain.   PERFORMANCE DEFICITS in functional skills including ADLs, IADLs, coordination, dexterity, [place], GMC, mobility, balance, and vision, cognitive skills including attention, memory, orientation, problem solving, safety awareness, sequencing, and temperament/personality, and psychosocial skills including environmental adaptation, interpersonal interactions, and routines and behaviors.     IMPAIRMENTS are limiting patient from ADLs, IADLs, rest and sleep, leisure, and social participation.    COMORBIDITIES may have co-morbidities that affects occupational performance. Patient will benefit from skilled OT to address above impairments and improve overall function.     PLAN: OT FREQUENCY: 2x/week   OT DURATION: 6 weeks   PLANNED INTERVENTIONS: self care/ADL training, therapeutic exercise, therapeutic activity, neuromuscular re-education, balance training, functional mobility training, aquatic therapy, splinting, moist heat, cryotherapy, patient/family education, cognitive remediation/compensation, visual/perceptual remediation/compensation, psychosocial skills training, energy conservation, and DME and/or AE instructions   RECOMMENDED OTHER SERVICES: Currently receiving PT/ST services at this location   CONSULTED AND AGREED WITH PLAN OF CARE: Patient and family member/caregiver   PLAN FOR NEXT SESSION: Continue to address L-sided attention, proprioception and kinesthetic sense   Kathrine Cords, MSOT, OTR/L 12/10/21, 4:23 PM

## 2021-12-10 NOTE — Patient Instructions (Signed)
High Cholesterol  High cholesterol is a condition in which the blood has high levels of a white, waxy substance similar to fat (cholesterol). The liver makes all the cholesterol that the body needs. The human body needs small amounts of cholesterol to help build cells. A person gets extra or excess cholesterol from the food that he or she eats. The blood carries cholesterol from the liver to the rest of the body. If you have high cholesterol, deposits (plaques) may build up on the walls of your arteries. Arteries are the blood vessels that carry blood away from your heart. These plaques make the arteries narrow and stiff. Cholesterol plaques increase your risk for heart attack and stroke. Work with your health care provider to keep your cholesterol levels in a healthy range. What increases the risk? The following factors may make you more likely to develop this condition: Eating foods that are high in animal fat (saturated fat) or cholesterol. Being overweight. Not getting enough exercise. A family history of high cholesterol (familial hypercholesterolemia). Use of tobacco products. Having diabetes. What are the signs or symptoms? In most cases, high cholesterol does not usually cause any symptoms. In severe cases, very high cholesterol levels can cause: Fatty bumps under the skin (xanthomas). A white or gray ring around the black center (pupil) of the eye. How is this diagnosed? This condition may be diagnosed based on the results of a blood test. If you are older than 79 years of age, your health care provider may check your cholesterol levels every 4-6 years. You may be checked more often if you have high cholesterol or other risk factors for heart disease. The blood test for cholesterol measures: "Bad" cholesterol, or LDL cholesterol. This is the main type of cholesterol that causes heart disease. The desired level is less than 100 mg/dL (2.59 mmol/L). "Good" cholesterol, or HDL  cholesterol. HDL helps protect against heart disease by cleaning the arteries and carrying the LDL to the liver for processing. The desired level for HDL is 60 mg/dL (1.55 mmol/L) or higher. Triglycerides. These are fats that your body can store or burn for energy. The desired level is less than 150 mg/dL (1.69 mmol/L). Total cholesterol. This measures the total amount of cholesterol in your blood and includes LDL, HDL, and triglycerides. The desired level is less than 200 mg/dL (5.17 mmol/L). How is this treated? Treatment for high cholesterol starts with lifestyle changes, such as diet and exercise. Diet changes. You may be asked to eat foods that have more fiber and less saturated fats or added sugar. Lifestyle changes. These may include regular exercise, maintaining a healthy weight, and quitting use of tobacco products. Medicines. These are given when diet and lifestyle changes have not worked. You may be prescribed a statin medicine to help lower your cholesterol levels. Follow these instructions at home: Eating and drinking  Eat a healthy, balanced diet. This diet includes: Daily servings of a variety of fresh, frozen, or canned fruits and vegetables. Daily servings of whole grain foods that are rich in fiber. Foods that are low in saturated fats and trans fats. These include poultry and fish without skin, lean cuts of meat, and low-fat dairy products. A variety of fish, especially oily fish that contain omega-3 fatty acids. Aim to eat fish at least 2 times a week. Avoid foods and drinks that have added sugar. Use healthy cooking methods, such as roasting, grilling, broiling, baking, poaching, steaming, and stir-frying. Do not fry your food except for   stir-frying. If you drink alcohol: Limit how much you have to: 0-1 drink a day for women who are not pregnant. 0-2 drinks a day for men. Know how much alcohol is in a drink. In the [place] one drink equals one 12 oz bottle of beer (355 mL),  one 5 oz glass of wine (148 mL), or one 1 oz glass of hard liquor (44 mL). Lifestyle  Get regular exercise. Aim to exercise for a total of 150 minutes a week. Increase your activity level by doing activities such as gardening, walking, and taking the stairs. Do not use any products that contain nicotine or tobacco. These products include cigarettes, chewing tobacco, and vaping devices, such as e-cigarettes. If you need help quitting, ask your health care provider. General instructions Take over-the-counter and prescription medicines only as told by your health care provider. Keep all follow-up visits. This is important. Where to find more information American Heart Association: www.heart.org National Heart, Lung, and Blood Institute: www.nhlbi.nih.gov Contact a health care provider if: You have trouble achieving or maintaining a healthy diet or weight. You are starting an exercise program. You are unable to stop smoking. Get help right away if: You have chest pain. You have trouble breathing. You have discomfort or pain in your jaw, neck, back, shoulder, or arm. You have any symptoms of a stroke. "BE FAST" is an easy way to remember the main warning signs of a stroke: B - Balance. Signs are dizziness, sudden trouble walking, or loss of balance. E - Eyes. Signs are trouble seeing or a sudden change in vision. F - Face. Signs are sudden weakness or numbness of the face, or the face or eyelid drooping on one side. A - Arms. Signs are weakness or numbness in an arm. This happens suddenly and usually on one side of the body. S - Speech. Signs are sudden trouble speaking, slurred speech, or trouble understanding what people say. T - Time. Time to call emergency services. Write down what time symptoms started. You have other signs of a stroke, such as: A sudden, severe headache with no known cause. Nausea or vomiting. Seizure. These symptoms may represent a serious problem that is an  emergency. Do not wait to see if the symptoms will go away. Get medical help right away. Call your local emergency services (911 in the [place] Do not drive yourself to the hospital. Summary Cholesterol plaques increase your risk for heart attack and stroke. Work with your health care provider to keep your cholesterol levels in a healthy range. Eat a healthy, balanced diet, get regular exercise, and maintain a healthy weight. Do not use any products that contain nicotine or tobacco. These products include cigarettes, chewing tobacco, and vaping devices, such as e-cigarettes. Get help right away if you have any symptoms of a stroke. This information is not intended to replace advice given to you by your health care provider. Make sure you discuss any questions you have with your health care provider. Document Revised: 08/14/2020 Document Reviewed: 08/04/2020 Elsevier Patient Education  2023 Elsevier Inc.  

## 2021-12-10 NOTE — Assessment & Plan Note (Signed)
F/u neuro  

## 2021-12-11 ENCOUNTER — Other Ambulatory Visit (HOSPITAL_BASED_OUTPATIENT_CLINIC_OR_DEPARTMENT_OTHER): Payer: Self-pay

## 2021-12-11 DIAGNOSIS — I679 Cerebrovascular disease, unspecified: Secondary | ICD-10-CM | POA: Diagnosis not present

## 2021-12-11 DIAGNOSIS — I10 Essential (primary) hypertension: Secondary | ICD-10-CM | POA: Diagnosis not present

## 2021-12-11 LAB — CARBAMAZEPINE LEVEL, TOTAL: Carbamazepine Lvl: 5.5 mg/L (ref 4.0–12.0)

## 2021-12-12 ENCOUNTER — Other Ambulatory Visit: Payer: Self-pay | Admitting: Family Medicine

## 2021-12-14 ENCOUNTER — Ambulatory Visit: Payer: PPO | Attending: Family Medicine | Admitting: Occupational Therapy

## 2021-12-14 ENCOUNTER — Other Ambulatory Visit (HOSPITAL_BASED_OUTPATIENT_CLINIC_OR_DEPARTMENT_OTHER): Payer: Self-pay

## 2021-12-14 ENCOUNTER — Ambulatory Visit: Payer: PPO

## 2021-12-14 DIAGNOSIS — R262 Difficulty in walking, not elsewhere classified: Secondary | ICD-10-CM

## 2021-12-14 DIAGNOSIS — R41841 Cognitive communication deficit: Secondary | ICD-10-CM | POA: Insufficient documentation

## 2021-12-14 DIAGNOSIS — I69318 Other symptoms and signs involving cognitive functions following cerebral infarction: Secondary | ICD-10-CM

## 2021-12-14 DIAGNOSIS — Z9181 History of falling: Secondary | ICD-10-CM | POA: Diagnosis not present

## 2021-12-14 DIAGNOSIS — I69354 Hemiplegia and hemiparesis following cerebral infarction affecting left non-dominant side: Secondary | ICD-10-CM | POA: Diagnosis not present

## 2021-12-14 DIAGNOSIS — R41840 Attention and concentration deficit: Secondary | ICD-10-CM

## 2021-12-14 DIAGNOSIS — R2689 Other abnormalities of gait and mobility: Secondary | ICD-10-CM | POA: Insufficient documentation

## 2021-12-14 DIAGNOSIS — R278 Other lack of coordination: Secondary | ICD-10-CM | POA: Diagnosis not present

## 2021-12-14 DIAGNOSIS — M25512 Pain in left shoulder: Secondary | ICD-10-CM | POA: Insufficient documentation

## 2021-12-14 DIAGNOSIS — M6281 Muscle weakness (generalized): Secondary | ICD-10-CM | POA: Diagnosis not present

## 2021-12-14 DIAGNOSIS — R296 Repeated falls: Secondary | ICD-10-CM | POA: Diagnosis not present

## 2021-12-14 MED ORDER — AMANTADINE HCL 100 MG PO CAPS
100.0000 mg | ORAL_CAPSULE | Freq: Every day | ORAL | 0 refills | Status: DC
  Filled 2021-12-14: qty 30, 30d supply, fill #0

## 2021-12-14 MED ORDER — HYDRALAZINE HCL 25 MG PO TABS
25.0000 mg | ORAL_TABLET | Freq: Three times a day (TID) | ORAL | 0 refills | Status: DC
  Filled 2021-12-14: qty 90, 30d supply, fill #0

## 2021-12-14 NOTE — Therapy (Signed)
[facility] [place], [name], [address] Phone: [phone]   Fax:  [phone]  Physical Therapy Treatment  Patient Details  Name: William [name] MRN: [id number] Date of Birth: Nov 07, 1940 Referring Provider (PT): Supple   Encounter Date: 12/14/2021   PT End of Session - 12/14/21 1404     Visit Number 20    Date for PT Re-Evaluation 12/31/21    PT Start Time 2778    PT Stop Time 2423    PT Time Calculation (min) 42 min    Activity Tolerance Patient tolerated treatment well;No increased pain    Behavior During Therapy Flat affect;Anxious;Restless             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   Mild, related to stroke history   Nocturia    Renal insufficiency 06/25/2013   S/P carotid endarterectomy    BILATERAL ICA--  PATENT PER DUPLEX  05-19-2012   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   Right calf   Urinary frequency    Vitamin D deficiency     Past Surgical History:  Procedure Laterality Date   APPENDECTOMY  AS CHILD   CARDIOVASCULAR STRESS TEST  03-27-2012  DR CRENSHAW   LOW RISK LEXISCAN STUDY-- PROBABLE NORMAL PERFUSION AND SOFT TISSUE ATTENUATION/  NO ISCHEMIA/ EF 51%   CAROTID ENDARTERECTOMY Bilateral LEFT  11-12-2008  DR GREG HAYES   RIGHT ICA  2006  ([facility])    CYSTOSCOPY W/ RETROGRADES Bilateral 06/22/2021   Procedure: CYSTOSCOPY WITH RETROGRADE PYELOGRAM;  Surgeon: Franchot Gallo, MD;  Location[facility];  Service: Urology;  Laterality: Bilateral;   CYSTOSCOPY WITH LITHOLAPAXY N/A 02/26/2013   Procedure: CYSTOSCOPY WITH LITHOLAPAXY;  Surgeon: Franchot Gallo, MD;  Location: [facility];  Service: Urology;  Laterality: N/A;   EYE SURGERY  Jan. 2016   cataract surgery both eyes   INGUINAL HERNIA REPAIR Right 11-08-2006   IR KYPHO EA ADDL LEVEL THORACIC OR LUMBAR  02/12/2021   IR RADIOLOGIST EVAL & MGMT  02/18/2021   MASS EXCISION N/A 03/03/2016   Procedure: EXCISION OF BACK  MASS;  Surgeon: Stark Klein, MD;  Location: [facility];  Service: General;  Laterality: N/A;   MOHS SURGERY Left 1/ 2016   Dr Nevada Crane-- Basal cell   PROSTATE SURGERY     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-C N/A 06/22/2021   Procedure: TRANSURETHRAL RESECTION OF BLADDER TUMOR;  Surgeon: Franchot Gallo, MD;  Location: [facility];  Service: Urology;  Laterality: N/A;   TRANSURETHRAL RESECTION OF PROSTATE N/A 02/26/2013   Procedure: TRANSURETHRAL RESECTION OF THE PROSTATE WITH GYRUS INSTRUMENTS;  Surgeon: Franchot Gallo, MD;  Location: [facility];  Service: Urology;  Laterality: N/A;  TRANSURETHRAL RESECTION OF PROSTATE N/A 06/22/2021   Procedure: TRANSURETHRAL RESECTION OF THE PROSTATE (TURP);  Surgeon: Franchot Gallo, MD;  Location: [facility];  Service: Urology;  Laterality: N/A;    There were no vitals filed for this visit.   Subjective Assessment - 12/14/21 1449     Subjective Left shoulder feels better after the injection. No falls recently             Reasses TUG 15.33  Seated side to side x10  Scooting side to side  Scooting backwards and forwards Seated marches 2# 2x10 LAQ 2# x10 Standing 2# ABD and EXT 2x10 Walking hold a ball 3 laps                              PT Short Term Goals - 12/14/21 1451       PT SHORT TERM GOAL #1   Title independent with initial HEP    Time 2    Period Weeks    Status On-going               PT Long Term Goals - 12/14/21 1451       PT LONG TERM GOAL #1   Title Understand fall risks, the importance of exercise and flexibility to health and function    Status Partially Met      PT LONG TERM GOAL #2   Title decrease TUG time to 13 seconds    Status On-going      PT LONG TERM GOAL #3   Title walk around our building without rest     Status On-going      PT LONG TERM GOAL #4   Title increase LE strength to 4+/5    Status On-going                   Plan - 12/14/21 1449     Clinical Impression Statement William [name] arrived doing well, today was his 20th visit so we reassessed some of his goals. He is still working on his TUG time. Continued with some LE strengthening and scooting activities to help with transfers in and out of bed and the car.    PT Next Visit Plan continue to work on balance functional mobility, safety and treat shoulder for pain and ROM as we can    Consulted and Agree with Plan of Care Patient             Patient will benefit from skilled therapeutic intervention in order to improve the following deficits and impairments:  Abnormal gait, Decreased coordination, Decreased range of motion, Difficulty walking, Increased muscle spasms, Cardiopulmonary status limiting activity, Decreased endurance, Decreased activity tolerance, Pain, Impaired flexibility, Decreased balance, Decreased mobility, Decreased strength, Postural dysfunction, Increased edema  Visit Diagnosis: Attention and concentration deficit  Other abnormalities of gait and mobility  Other lack of coordination  History of falling  Repeated falls  Muscle weakness (generalized)  Difficulty in walking, not elsewhere classified  Other symptoms and signs  involving cognitive functions following cerebral infarction  Hemiplegia and hemiparesis following cerebral infarction affecting left non-dominant side [facility])     Problem List Patient Active Problem List   Diagnosis Date Noted   Acute pain of left shoulder 11/12/2021   Leukocytes in urine 11/12/2021   Urinary frequency 11/12/2021   Thrush 10/08/2021   Hemiplegia, dominant side S/P CVA (cerebrovascular accident) ([place]) 09/11/2021   Insomnia    Prediabetes  Chronic kidney disease (CKD), stage IV (severe) (HCC)    Basal ganglia infarction ([place]) 07/29/2021   Transaminitis 07/27/2021   UTI (urinary tract infection) 07/27/2021   CVA (cerebral vascular accident) ([place]) 07/27/2021   Fall at home, initial encounter 07/27/2021   Hyperglycemia 07/27/2021   CKD (chronic kidney disease), stage IV ([place]) 07/27/2021   Cholelithiasis 07/27/2021   Hypoxia 07/27/2021   Nausea and vomiting 70/62/3762   Acute metabolic encephalopathy 83/15/1761   Normocytic anemia 07/27/2021   Chronic back pain 07/27/2021   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[place]) 06/22/2021   Closed fracture of first lumbar vertebra with routine healing 02/03/2021   Closed fracture of multiple ribs 11/18/2020   Anxiety 01/29/2020   Leg pain, bilateral 01/29/2020   Ingrown toenail 07/13/2019   Lumbar spondylosis 05/02/2018   Pain in left knee 03/09/2018   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   Trochanteric bursitis of left hip 01/16/2018   Preventative health care 09/26/2017   HTN (hypertension) 07/19/2015   Hyperlipidemia 07/19/2015   Great toe pain 02/11/2014   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 PT 12/14/2021, 2:52 PM  [facility]. [place], [name], [address] Phone: [phone]   Fax:  [phone]  Name: William [name] MRN: [id number] Date of Birth: [birth year]

## 2021-12-14 NOTE — Telephone Encounter (Signed)
PA for Loreev XR 1 mg DENIED by [employer].

## 2021-12-14 NOTE — Therapy (Unsigned)
OUTPATIENT OCCUPATIONAL THERAPY TREATMENT NOTE   Patient Name: William [name] MRN: [id number] DOB:03-18-1941, 81 y.o., male Today's Date: 12/15/2021  PCP: Roma Schanz, DO REFERRING PROVIDER: Carollee Herter, Alferd Apa, DO   OT End of Session - 12/14/21 1634     Visit Number 15    Number of Visits 21    Date for OT Re-Evaluation 01/13/22    Authorization Type [employer]    Authorization Time Period VL: [place]    Progress Note Due on Visit 10    OT Start Time 1535    OT Stop Time 1617    OT Time Calculation (min) 42 min    Activity Tolerance Patient tolerated treatment well    Behavior During Therapy Flat affect;Anxious;Restless            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   Mild, related to stroke history   Nocturia    Renal insufficiency 06/25/2013   S/P carotid endarterectomy    BILATERAL ICA--  PATENT PER DUPLEX  05-19-2012   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   Right calf   Urinary frequency    Vitamin D deficiency    Past Surgical History:  Procedure Laterality Date   APPENDECTOMY  AS CHILD   CARDIOVASCULAR STRESS TEST  03-27-2012  DR CRENSHAW   LOW RISK LEXISCAN STUDY-- PROBABLE NORMAL PERFUSION AND SOFT TISSUE ATTENUATION/  NO ISCHEMIA/ EF 51%   CAROTID ENDARTERECTOMY Bilateral LEFT  11-12-2008  DR GREG HAYES   RIGHT ICA   2006  ([facility])   CYSTOSCOPY W/ RETROGRADES Bilateral 06/22/2021   Procedure: CYSTOSCOPY WITH RETROGRADE PYELOGRAM;  Surgeon: Franchot Gallo, MD;  Location: [facility];  Service: Urology;  Laterality: Bilateral;   CYSTOSCOPY WITH LITHOLAPAXY N/A 02/26/2013   Procedure: CYSTOSCOPY WITH LITHOLAPAXY;  Surgeon: Franchot Gallo, MD;  Location: [facility];  Service: Urology;  Laterality: N/A;   EYE SURGERY  Jan. 2016   cataract surgery both eyes   INGUINAL HERNIA REPAIR Right 11-08-2006   IR KYPHO EA ADDL LEVEL THORACIC OR LUMBAR  02/12/2021   IR RADIOLOGIST EVAL & MGMT  02/18/2021   MASS EXCISION N/A 03/03/2016   Procedure: EXCISION OF BACK  MASS;  Surgeon: Stark Klein, MD;  Location: [place];  Service: General;  Laterality: N/A;   MOHS SURGERY Left 1/ 2016   Dr Nevada Crane-- Basal cell   PROSTATE SURGERY     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-C N/A 06/22/2021   Procedure: TRANSURETHRAL RESECTION OF BLADDER TUMOR;  Surgeon: Franchot Gallo, MD;  Location: [facility];  Service: Urology;  Laterality: N/A;   TRANSURETHRAL RESECTION OF PROSTATE N/A 02/26/2013   Procedure: TRANSURETHRAL RESECTION OF THE PROSTATE WITH GYRUS INSTRUMENTS;  Surgeon: Franchot Gallo, MD;  Location: [place];  Service: Urology;  Laterality: N/A;   TRANSURETHRAL RESECTION OF PROSTATE N/A 06/22/2021   Procedure: TRANSURETHRAL RESECTION OF THE PROSTATE (TURP);  Surgeon: Franchot Gallo, MD;  Location: [facility];  Service: Urology;  Laterality: N/A;   Patient Active Problem List   Diagnosis Date Noted   Acute pain of left shoulder 11/12/2021   Leukocytes in urine 11/12/2021   Urinary frequency 11/12/2021   Thrush 10/08/2021   Hemiplegia, dominant side S/P CVA (cerebrovascular accident) ([place]) 09/11/2021   Insomnia    Prediabetes    Chronic kidney disease (CKD), stage IV (severe) ([place])    Basal ganglia  infarction ([place]) 07/29/2021   Transaminitis 07/27/2021   UTI (urinary tract infection) 07/27/2021   CVA (cerebral vascular accident) ([place]) 07/27/2021   Fall at home, initial encounter 07/27/2021   Hyperglycemia 07/27/2021   CKD (chronic kidney disease), stage IV ([place]) 07/27/2021   Cholelithiasis 07/27/2021   Hypoxia 07/27/2021   Nausea and vomiting 03/47/4259   Acute metabolic encephalopathy 56/38/7564   Normocytic anemia 07/27/2021   Chronic back pain 07/27/2021   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[place]) 06/22/2021   Closed fracture of first lumbar vertebra with routine healing 02/03/2021   Closed fracture of multiple ribs 11/18/2020   Anxiety 01/29/2020   Leg pain, bilateral 01/29/2020   Ingrown toenail 07/13/2019   Lumbar spondylosis 05/02/2018   Pain in left knee 03/09/2018   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   Trochanteric bursitis of left hip 01/16/2018   Preventative health care 09/26/2017   HTN (hypertension) 07/19/2015   Hyperlipidemia 07/19/2015   Great toe pain 02/11/2014   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   Obesity (BMI 30-39.9) 06/25/2013   Renal insufficiency 06/25/2013   Weakness of left arm 06/25/2013   Sebaceous cyst 03/03/2011   Sprain of lumbar region 07/31/2010   Rib pain, left 08/29/2009   Carotid artery stenosis, asymptomatic, bilateral 05/02/2009   Eczema, atopic 05/31/2008   Vitamin D deficiency 03/01/2008   BPH (benign prostatic hyperplasia) 08/06/2007   Fasting hyperglycemia 12/21/2006   History of right MCA infarct 2006    ONSET DATE: 07/27/21  REFERRING DIAG: R41.4 (ICD-10-CM) - Hemi-inattention I63.9 (ICD-10-CM) - Cerebrovascular accident (CVA), unspecified mechanism ([place])   THERAPY DIAG:  Attention and concentration deficit  Other abnormalities of gait and mobility  Other lack of coordination  Other symptoms and signs involving cognitive functions following cerebral infarction  History of  falling  PERTINENT HISTORY: L basal ganglia and corona radiata infarct 07/27/21 (had documented AMS for about 5 days prior w/ hospital work-up including MRI negative and symptoms attributed to a UTI); L lower homonymous quadrantanopia. Other PMH includes R MCA infarct in 2006 w/ L-sided hemiparesis, NCD, carotid artery disease (bilateral), HTN, HLD,CKD3, and hx of bladder cancer  PRECAUTIONS: Fall risk  SUBJECTIVE:   SUBJECTIVE ASSESSMENT: "I've got problems...the rope ladder doesn't work"  PAIN: Are you having pain? No   OBJECTIVE:   TODAY'S TREATMENT: Pt completed toileting w/ caregiver at start of session, OT discussed potential compensatory strategies and methods for cueing to increase pt participation and decrease pt agitation as well as caregiver strain Bed mobility: completed transition from sitting EOM <> supine w/ HOB elevated; pt required SPV w/ 1-step verbal cues and extended time for repositioning in supine during all attempts. Transition from supine to return to seated position completed w/ SPV in all attempts w/out AE; pt demonstrated spontaneous use of LUE each transition. Also attempted to  incorporate self-purchased bed ladder during 2 attempts w/ pt demonstrating increased difficulty, requiring increased cues and facilitation for success. OT provided relevant caregiver education prn. Sitting on large therapy ball, focusing on body awareness, balance and righting reactions, and core strength; OT had pt maintain balance in midline w/out UE support while OT shifting ball back and forth; increased posterior pelvic tilt and posterior leaning w/out UE support of // bars Tapping the number called out w/ specified UE; numbers positioned on wall in the pattern of a traditional analog clock. Activity facilitated unilateral GMC/coordination, visual scanning and visual perception, and alternating attention. Completed in standing for additional challenge; graded up w/ pt tapping each number  corresponding to the month called out and pt demo'd increased difficulty. OT provided mod tactile and verbal cues when cued to use LUE   PATIENT EDUCATION: Ongoing caregiver education, particularly related to cues and facilitation of increased pt participation in toileting and bed mobility tasks Person educated: Patient and Spouse Education method: Explanation Education comprehension: verbalized understanding   GOALS: Goals reviewed with patient? Yes   SHORT TERM GOALS: Target date: 12/29/2021   STG   Status:  1 Pt/caregiver will verbalize understanding of visual perceptual compensatory strategies Baseline: Decreased knowledge of visual deficits and strategies Progressing  2 Pt will complete symbol cancellation task w/ at least 50% accuracy using visual compensatory strategies to indicate improved attention Baseline: 40% accuracy w/ multiple errors Met - 11/17/21  3 Pt will participate in bilateral coordination activity w/ no more than 3 cues for incorporation of LUE Baseline: L-sided inattention Progressing      LONG TERM GOALS: Target date: 01/13/2022   LTG   Status:  1 Pt will be able to don overhead shirt w/ SPV and verbal/gestural cues prn in at least 1 attempt Baseline: Min A to don/doff shirt sleeve on L side Revised 12/11/21  2 Pt will be able to thread bottoms on BLEs or don socks w/ Mod A Baseline: Max A for LB dressing Revised 12/11/21  3 Pt will demonstrate supine-to-sit transition w/ cues from caregiver and incorporation of AE/DME prn Baseline: Mod-Max A w/ bed mobility Met - 12/03/21; SPV w/ bed ladder and cues  4 Pt will be able to use TV remote (or adaptive remote) to complete 1 task (e.g., turn TV on/of, turn volume up/down) w/ SPV Baseline: Difficulty  Not met  5 Pt will increase Barthel Index score by at least 5 to indicate improved level of participation in BADLs Baseline: 55/100 Progressing  6 Pt will be able to make a peanut butter and jelly sandwich w/ SPV and  verbal/tactile cues prn Progressing     ASSESSMENT:   CLINICAL IMPRESSION: Pt's spouse brought in self-purchased bed ladder to practice w/ OT and pt attempted to facilitate supine to sitting EOM transition, but without cues, pt was able to return to seated position without AD. Pt demonstrated spontaneous incorporation of LUE during transition, which he has never done without cues. Pt was able to replicate success without AD in 3/3 attempts, but OT did practice w/ bed ladder in case pt was unable to replicate success observed in session at home. Incorporation of LUE this session may be due to decreased shoulder pain and/or positive results of increased focus in OP therapies on L-sided awareness, kinesthetic sense, and BUE coordination.   PERFORMANCE DEFICITS in functional skills including ADLs, IADLs, coordination, dexterity, [place], GMC, mobility, balance, and vision, cognitive skills including attention, memory, orientation, problem solving, safety awareness, sequencing, and  temperament/personality, and psychosocial skills including environmental adaptation, interpersonal interactions, and routines and behaviors.    IMPAIRMENTS are limiting patient from ADLs, IADLs, rest and sleep, leisure, and social participation.    COMORBIDITIES may have co-morbidities that affects occupational performance. Patient will benefit from skilled OT to address above impairments and improve overall function.     PLAN: OT FREQUENCY: 2x/week   OT DURATION: 10 weeks   PLANNED INTERVENTIONS: self care/ADL training, therapeutic exercise, therapeutic activity, neuromuscular re-education, balance training, functional mobility training, aquatic therapy, splinting, moist heat, cryotherapy, patient/family education, cognitive remediation/compensation, visual/perceptual remediation/compensation, psychosocial skills training, energy conservation, and DME and/or AE instructions   RECOMMENDED OTHER SERVICES: Currently receiving  PT/ST services at this location   CONSULTED AND AGREED WITH PLAN OF CARE: Patient and family member/caregiver   PLAN FOR NEXT SESSION: Continue w/ L-sided attention, proprioception and kinesthetic sense; practice BADLs   Kathrine Cords, MSOT, OTR/L 12/14/21, 4:38 PM

## 2021-12-16 ENCOUNTER — Telehealth (HOSPITAL_COMMUNITY): Payer: Self-pay | Admitting: *Deleted

## 2021-12-16 ENCOUNTER — Other Ambulatory Visit (HOSPITAL_BASED_OUTPATIENT_CLINIC_OR_DEPARTMENT_OTHER): Payer: Self-pay

## 2021-12-16 NOTE — Telephone Encounter (Signed)
Writer spoke with pt's wife, [name], to inform that appeal for the Loreev XR 1 mg caps, has been APPROVED   by [employer].  Coverage start date: 12/14/21  Coverage end date: 06/03/22  Request ID# [id number]

## 2021-12-17 ENCOUNTER — Other Ambulatory Visit (HOSPITAL_BASED_OUTPATIENT_CLINIC_OR_DEPARTMENT_OTHER): Payer: Self-pay

## 2021-12-17 ENCOUNTER — Encounter: Payer: Self-pay | Admitting: Occupational Therapy

## 2021-12-17 ENCOUNTER — Ambulatory Visit: Payer: PPO | Admitting: Occupational Therapy

## 2021-12-17 ENCOUNTER — Encounter: Payer: Self-pay | Admitting: Family Medicine

## 2021-12-17 DIAGNOSIS — R278 Other lack of coordination: Secondary | ICD-10-CM

## 2021-12-17 DIAGNOSIS — R41840 Attention and concentration deficit: Secondary | ICD-10-CM

## 2021-12-17 DIAGNOSIS — R2689 Other abnormalities of gait and mobility: Secondary | ICD-10-CM

## 2021-12-17 DIAGNOSIS — I69318 Other symptoms and signs involving cognitive functions following cerebral infarction: Secondary | ICD-10-CM

## 2021-12-17 DIAGNOSIS — Z9181 History of falling: Secondary | ICD-10-CM

## 2021-12-17 NOTE — Therapy (Unsigned)
OUTPATIENT OCCUPATIONAL THERAPY TREATMENT NOTE & RECERTIFICATION   Patient Name: William [name] MRN: [id number] DOB:[birth year], 81 y.o., male Today's Date: 12/17/2021  PCP: Roma Schanz, DO REFERRING PROVIDER: Carollee Herter, Alferd Apa, DO      OT End of Session - 12/17/21 1553      Visit Number 16    Number of Visits 21     Date for OT Re-Evaluation 01/13/22     Authorization Type [employer]     Authorization Time Period VL: [place]     Progress Note Due on Visit 20    OT Start Time 1530    OT Stop Time 1619    OT Time Calculation (min) 49 min     Activity Tolerance Patient tolerated treatment well     Behavior During Therapy Flat affect;Anxious;Restless            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   Mild, related to stroke history   Nocturia    Renal insufficiency 06/25/2013   S/P carotid endarterectomy    BILATERAL ICA--  PATENT PER DUPLEX  05-19-2012   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   Right calf   Urinary frequency    Vitamin D deficiency    Past Surgical History:  Procedure Laterality Date   APPENDECTOMY  AS CHILD   CARDIOVASCULAR STRESS TEST  03-27-2012  DR CRENSHAW   LOW RISK LEXISCAN STUDY-- PROBABLE NORMAL PERFUSION AND SOFT TISSUE ATTENUATION/  NO ISCHEMIA/ EF 51%   CAROTID ENDARTERECTOMY Bilateral LEFT  11-12-2008  DR  GREG HAYES   RIGHT ICA  2006  ([facility])   CYSTOSCOPY W/ RETROGRADES Bilateral 06/22/2021   Procedure: CYSTOSCOPY WITH RETROGRADE PYELOGRAM;  Surgeon: Franchot Gallo, MD;  Location[facility];  Service: Urology;  Laterality: Bilateral;   CYSTOSCOPY WITH LITHOLAPAXY N/A 02/26/2013   Procedure: CYSTOSCOPY WITH LITHOLAPAXY;  Surgeon: Franchot Gallo, MD;  Location: [facility];  Service: Urology;  Laterality: N/A;   EYE SURGERY  Jan. 2016   cataract surgery both eyes   INGUINAL HERNIA REPAIR Right 11-08-2006   IR KYPHO EA ADDL LEVEL THORACIC OR LUMBAR  02/12/2021   IR RADIOLOGIST EVAL & MGMT  02/18/2021   MASS EXCISION N/A 03/03/2016   Procedure: EXCISION OF BACK  MASS;  Surgeon: Stark Klein, MD;  Location: [place];  Service: General;  Laterality: N/A;   MOHS SURGERY Left 1/ 2016   Dr Nevada Crane-- Basal cell   PROSTATE SURGERY     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-C N/A 06/22/2021   Procedure: TRANSURETHRAL RESECTION OF BLADDER TUMOR;  Surgeon: Franchot Gallo, MD;  Location: [facility];  Service: Urology;  Laterality: N/A;   TRANSURETHRAL RESECTION OF PROSTATE N/A 02/26/2013   Procedure: TRANSURETHRAL  RESECTION OF THE PROSTATE WITH GYRUS INSTRUMENTS;  Surgeon: Franchot Gallo, MD;  Location: [facility];  Service: Urology;  Laterality: N/A;   TRANSURETHRAL RESECTION OF PROSTATE N/A 06/22/2021   Procedure: TRANSURETHRAL RESECTION OF THE PROSTATE (TURP);  Surgeon: Franchot Gallo, MD;  Location: [facility];  Service: Urology;  Laterality: N/A;   Patient Active Problem List   Diagnosis Date Noted   Acute pain of left shoulder 11/12/2021   Leukocytes in urine 11/12/2021   Urinary frequency 11/12/2021   Thrush 10/08/2021   Hemiplegia, dominant side S/P CVA (cerebrovascular accident) ([place]) 09/11/2021   Insomnia    Prediabetes    Chronic kidney disease (CKD), stage IV (severe)  ([place])    Basal ganglia infarction ([place]) 07/29/2021   Transaminitis 07/27/2021   UTI (urinary tract infection) 07/27/2021   CVA (cerebral vascular accident) ([place]) 07/27/2021   Fall at home, initial encounter 07/27/2021   Hyperglycemia 07/27/2021   CKD (chronic kidney disease), stage IV ([place]) 07/27/2021   Cholelithiasis 07/27/2021   Hypoxia 07/27/2021   Nausea and vomiting 56/21/3086   Acute metabolic encephalopathy 57/84/6962   Normocytic anemia 07/27/2021   Chronic back pain 07/27/2021   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[place]) 06/22/2021   Closed fracture of first lumbar vertebra with routine healing 02/03/2021   Closed fracture of multiple ribs 11/18/2020   Anxiety 01/29/2020   Leg pain, bilateral 01/29/2020   Ingrown toenail 07/13/2019   Lumbar spondylosis 05/02/2018   Pain in left knee 03/09/2018   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   Trochanteric bursitis of left hip 01/16/2018   Preventative health care 09/26/2017   HTN (hypertension) 07/19/2015   Hyperlipidemia 07/19/2015   Great toe pain 02/11/2014   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   Obesity (BMI 30-39.9) 06/25/2013   Renal insufficiency 06/25/2013   Weakness of left arm 06/25/2013   Sebaceous cyst 03/03/2011   Sprain of lumbar region 07/31/2010   Rib pain, left 08/29/2009   Carotid artery stenosis, asymptomatic, bilateral 05/02/2009   Eczema, atopic 05/31/2008   Vitamin D deficiency 03/01/2008   BPH (benign prostatic hyperplasia) 08/06/2007   Fasting hyperglycemia 12/21/2006   History of right MCA infarct 2006    ONSET DATE: 07/27/21  REFERRING DIAG: R41.4 (ICD-10-CM) - Hemi-inattention I63.9 (ICD-10-CM) - Cerebrovascular accident (CVA), unspecified mechanism ([place])   THERAPY DIAG:  Attention and concentration deficit  Other abnormalities of gait and mobility  Other lack of coordination  History of falling  Other symptoms and signs involving cognitive functions  following cerebral infarction  PERTINENT HISTORY: L basal ganglia and corona radiata infarct 07/27/21 (had documented AMS for about 5 days prior w/ hospital work-up including MRI negative and symptoms attributed to a UTI); L lower homonymous quadrantanopia. Other PMH includes R MCA infarct in 2006 w/ L-sided hemiparesis, NCD, carotid artery disease (bilateral), HTN, HLD,CKD3, and hx of bladder cancer  PRECAUTIONS: Fall risk  SUBJECTIVE:   SUBJECTIVE ASSESSMENT: Pt's wife reports [name] has been getting in/out of bed better lately and brought in videos of some attempts at home  PAIN: Are you having pain? No   OBJECTIVE:   TODAY'S TREATMENT: Discussed patient's participation in BADL and IADL tasks, problem-solving w/ pt and his wife to determine potential compensatory strategies or modifications prn, answering pt's spouse's questions as able. Pt is currently able to doff clothing w/out difficulty, requiring assistance to thread UEs and increased assist when donning an open-front shirt or jacket. Additional concerns identified also include acquiring simple snacks,  toileting, shaving, and toothbrushing. Pt's wife reports frequent accidents and no consistent schedule, as well as assistance for thoroughness w/ grooming tasks. Practiced donning sock on L foot w/ various adaptive strategies w/ pt demonstrating difficulty threading sock over whole foot. After repeated attempts w/out success, OT instructed pt to attempt donning oversized sock on R foot w/ continued difficulty despite attempted compensatory strategies. No AE attempted due to cognitive deficits and decreased importance of pt's independence w/ task. Overall able to complete task w/ Min A, requiring verbal cues for thoroughness after assistance w/ initially donning socks over toes. Doffing/donning open-front shirt w/ OT incorporating errorless learning, extended time, and chaining to facilitate success. Completed overall w/ Mod A across multiple  attempts. Pt frequently got "stuck" during various steps, despite prior success and multimodal cues; attention limitations may be a factor   PATIENT EDUCATION: Ongoing caregiver education, particularly related to cues and facilitation for increased pt participation; introduced potential benefit of toileting schedule Person educated: Patient and Spouse Education method: Explanation Education comprehension: verbalized understanding   GOALS: Goals reviewed with patient? Yes   SHORT TERM GOALS: Target date: 12/29/2021   STG   Status:  1 Pt/caregiver will verbalize understanding of visual perceptual compensatory strategies Baseline: Decreased knowledge of visual deficits and strategies Progressing  2 Pt will complete symbol cancellation task w/ at least 50% accuracy using visual compensatory strategies to indicate improved attention Baseline: 40% accuracy w/ multiple errors Met - 11/17/21  3 Pt will participate in bilateral coordination activity w/ no more than 3 cues for incorporation of LUE Baseline: L-sided inattention Progressing      LONG TERM GOALS: Target date: 01/13/2022   LTG   Status:  1 Pt will be able to don overhead shirt w/ SPV and verbal/gestural cues prn in at least 1 attempt Baseline: Min A to don/doff shirt sleeve on L side Revised 12/11/21  2 Pt will be able to thread bottoms on BLEs or don socks w/ Mod A Baseline: Max A for LB dressing Revised 12/11/21  3 Pt will demonstrate supine-to-sit transition w/ cues from caregiver and incorporation of AE/DME prn Baseline: Mod-Max A w/ bed mobility Met - 12/03/21; SPV w/ bed ladder and cues  4 Pt will be able to use TV remote (or adaptive remote) to complete 1 task (e.g., turn TV on/of, turn volume up/down) w/ SPV Baseline: Difficulty  Not Progressing - discontinued due to continued difficulty and agitation w/ task at home  5 Pt will increase Barthel Index score by at least 5 to indicate improved level of participation in  BADLs Baseline: 55/100 Progressing  6 Pt will be able to make a peanut butter and jelly sandwich w/ SPV and verbal/tactile cues prn Progressing     ASSESSMENT:   CLINICAL IMPRESSION: Pt continues to demonstrate improvement in LUE awareness and incorporation at gross assist level w/ in pt's wife demonstrating great understanding of learned strategies to increase pt's independence w/ functional tasks at home and overall sense of control. Pt and his spouse both verbalized desire to continue OP OT services w/ OT in agreement due to improvements observed since start of therapy. Pt will benefit from continued skilled OT services to address limitations w/ executive functioning, LUE GMC, visual perception, L-sided awareness, and weakness in order to decrease caregiver strain and improve pt's safety and participation in functional tasks and overall quality of life   PERFORMANCE DEFICITS in functional skills including ADLs, IADLs, coordination, dexterity, [place], GMC, mobility, balance, and vision, cognitive skills  including attention, memory, orientation, problem solving, safety awareness, sequencing, and temperament/personality, and psychosocial skills including environmental adaptation, interpersonal interactions, and routines and behaviors.    IMPAIRMENTS are limiting patient from ADLs, IADLs, rest and sleep, leisure, and social participation.    COMORBIDITIES may have co-morbidities that affects occupational performance. Patient will benefit from skilled OT to address above impairments and improve overall function.     PLAN: OT FREQUENCY: 2x/week   OT DURATION: 10 weeks   PLANNED INTERVENTIONS: self care/ADL training, therapeutic exercise, therapeutic activity, neuromuscular re-education, balance training, functional mobility training, aquatic therapy, splinting, moist heat, cryotherapy, patient/family education, cognitive remediation/compensation, visual/perceptual remediation/compensation,  psychosocial skills training, energy conservation, and DME and/or AE instructions   RECOMMENDED OTHER SERVICES: Currently receiving PT services at this location   CONSULTED AND AGREED WITH PLAN OF CARE: Patient and family member/caregiver   PLAN FOR NEXT SESSION: Preparatory exercise(s) for L-sided attention, proprioception and kinesthetic sense prior to practice of BADL tasks   Kathrine Cords, MSOT, OTR/L 12/17/21, 4:52 PM

## 2021-12-18 DIAGNOSIS — G9341 Metabolic encephalopathy: Secondary | ICD-10-CM | POA: Diagnosis not present

## 2021-12-18 DIAGNOSIS — G20 Parkinson's disease: Secondary | ICD-10-CM | POA: Diagnosis not present

## 2021-12-18 DIAGNOSIS — M47816 Spondylosis without myelopathy or radiculopathy, lumbar region: Secondary | ICD-10-CM | POA: Diagnosis not present

## 2021-12-18 DIAGNOSIS — I6381 Other cerebral infarction due to occlusion or stenosis of small artery: Secondary | ICD-10-CM | POA: Diagnosis not present

## 2021-12-18 DIAGNOSIS — N184 Chronic kidney disease, stage 4 (severe): Secondary | ICD-10-CM | POA: Diagnosis not present

## 2021-12-18 DIAGNOSIS — S2249XA Multiple fractures of ribs, unspecified side, initial encounter for closed fracture: Secondary | ICD-10-CM | POA: Diagnosis not present

## 2021-12-19 DIAGNOSIS — S2249XA Multiple fractures of ribs, unspecified side, initial encounter for closed fracture: Secondary | ICD-10-CM | POA: Diagnosis not present

## 2021-12-19 DIAGNOSIS — G20 Parkinson's disease: Secondary | ICD-10-CM | POA: Diagnosis not present

## 2021-12-19 DIAGNOSIS — I6381 Other cerebral infarction due to occlusion or stenosis of small artery: Secondary | ICD-10-CM | POA: Diagnosis not present

## 2021-12-19 DIAGNOSIS — G9341 Metabolic encephalopathy: Secondary | ICD-10-CM | POA: Diagnosis not present

## 2021-12-19 DIAGNOSIS — N184 Chronic kidney disease, stage 4 (severe): Secondary | ICD-10-CM | POA: Diagnosis not present

## 2021-12-19 DIAGNOSIS — M47816 Spondylosis without myelopathy or radiculopathy, lumbar region: Secondary | ICD-10-CM | POA: Diagnosis not present

## 2021-12-22 ENCOUNTER — Ambulatory Visit: Payer: PPO

## 2021-12-22 ENCOUNTER — Ambulatory Visit: Payer: PPO | Admitting: Occupational Therapy

## 2021-12-22 ENCOUNTER — Other Ambulatory Visit: Payer: Self-pay

## 2021-12-22 ENCOUNTER — Encounter: Payer: Self-pay | Admitting: Occupational Therapy

## 2021-12-22 DIAGNOSIS — I69354 Hemiplegia and hemiparesis following cerebral infarction affecting left non-dominant side: Secondary | ICD-10-CM

## 2021-12-22 DIAGNOSIS — R7989 Other specified abnormal findings of blood chemistry: Secondary | ICD-10-CM

## 2021-12-22 DIAGNOSIS — I69318 Other symptoms and signs involving cognitive functions following cerebral infarction: Secondary | ICD-10-CM

## 2021-12-22 DIAGNOSIS — R262 Difficulty in walking, not elsewhere classified: Secondary | ICD-10-CM

## 2021-12-22 DIAGNOSIS — R41840 Attention and concentration deficit: Secondary | ICD-10-CM | POA: Diagnosis not present

## 2021-12-22 DIAGNOSIS — M25512 Pain in left shoulder: Secondary | ICD-10-CM

## 2021-12-22 DIAGNOSIS — R278 Other lack of coordination: Secondary | ICD-10-CM

## 2021-12-22 DIAGNOSIS — R2689 Other abnormalities of gait and mobility: Secondary | ICD-10-CM

## 2021-12-22 DIAGNOSIS — Z9181 History of falling: Secondary | ICD-10-CM

## 2021-12-22 DIAGNOSIS — M6281 Muscle weakness (generalized): Secondary | ICD-10-CM

## 2021-12-22 DIAGNOSIS — R41841 Cognitive communication deficit: Secondary | ICD-10-CM

## 2021-12-22 NOTE — Therapy (Signed)
OUTPATIENT OCCUPATIONAL THERAPY TREATMENT NOTE   Patient Name: William [name] MRN: [id number] DOB:1941-03-29, 81 y.o., male Today's Date: 12/22/2021  PCP: Roma Schanz, DO REFERRING PROVIDER: Carollee Herter, Alferd Apa, DO   OT End of Session - 12/22/21 1453     Visit Number 17    Number of Visits 21    Date for OT Re-Evaluation 01/13/22    Authorization Type [employer]    Authorization Time Period VL: [place]    Progress Note Due on Visit 20    OT Start Time 1446    OT Stop Time 1530    OT Time Calculation (min) 44 min    Activity Tolerance Patient tolerated treatment well    Behavior During Therapy Flat affect;Anxious;Restless             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   Mild, related to stroke history   Nocturia    Renal insufficiency 06/25/2013   S/P carotid endarterectomy    BILATERAL ICA--  PATENT PER DUPLEX  05-19-2012   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   Right calf   Urinary frequency    Vitamin D deficiency    Past Surgical History:  Procedure Laterality Date   APPENDECTOMY  AS CHILD   CARDIOVASCULAR STRESS TEST  03-27-2012  DR CRENSHAW   LOW RISK LEXISCAN STUDY-- PROBABLE NORMAL PERFUSION AND SOFT TISSUE ATTENUATION/  NO ISCHEMIA/ EF 51%   CAROTID ENDARTERECTOMY Bilateral LEFT  11-12-2008  DR GREG HAYES   RIGHT ICA   2006  ([facility])   CYSTOSCOPY W/ RETROGRADES Bilateral 06/22/2021   Procedure: CYSTOSCOPY WITH RETROGRADE PYELOGRAM;  Surgeon: Franchot Gallo, MD;  Location: [facility];  Service: Urology;  Laterality: Bilateral;   CYSTOSCOPY WITH LITHOLAPAXY N/A 02/26/2013   Procedure: CYSTOSCOPY WITH LITHOLAPAXY;  Surgeon: Franchot Gallo, MD;  Location: [facility];  Service: Urology;  Laterality: N/A;   EYE SURGERY  Jan. 2016   cataract surgery both eyes   INGUINAL HERNIA REPAIR Right 11-08-2006   IR KYPHO EA ADDL LEVEL THORACIC OR LUMBAR  02/12/2021   IR RADIOLOGIST EVAL & MGMT  02/18/2021   MASS EXCISION N/A 03/03/2016   Procedure: EXCISION OF BACK  MASS;  Surgeon: Stark Klein, MD;  Location: [place];  Service: General;  Laterality: N/A;   MOHS SURGERY Left 1/ 2016   Dr Nevada Crane-- Basal cell   PROSTATE SURGERY     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-C N/A 06/22/2021   Procedure: TRANSURETHRAL RESECTION OF BLADDER TUMOR;  Surgeon: Franchot Gallo, MD;  Location: [facility];  Service: Urology;  Laterality: N/A;   TRANSURETHRAL RESECTION OF PROSTATE N/A 02/26/2013   Procedure: TRANSURETHRAL RESECTION OF THE PROSTATE WITH GYRUS INSTRUMENTS;  Surgeon: Franchot Gallo,  MD;  Location: [place];  Service: Urology;  Laterality: N/A;   TRANSURETHRAL RESECTION OF PROSTATE N/A 06/22/2021   Procedure: TRANSURETHRAL RESECTION OF THE PROSTATE (TURP);  Surgeon: Franchot Gallo, MD;  Location: [facility];  Service: Urology;  Laterality: N/A;   Patient Active Problem List   Diagnosis Date Noted   Acute pain of left shoulder 11/12/2021   Leukocytes in urine 11/12/2021   Urinary frequency 11/12/2021   Thrush 10/08/2021   Hemiplegia, dominant side S/P CVA (cerebrovascular accident) ([place]) 09/11/2021   Insomnia    Prediabetes    Chronic kidney disease (CKD), stage IV (severe) ([place])    Basal ganglia  infarction ([place]) 07/29/2021   Transaminitis 07/27/2021   UTI (urinary tract infection) 07/27/2021   CVA (cerebral vascular accident) ([place]) 07/27/2021   Fall at home, initial encounter 07/27/2021   Hyperglycemia 07/27/2021   CKD (chronic kidney disease), stage IV ([place]) 07/27/2021   Cholelithiasis 07/27/2021   Hypoxia 07/27/2021   Nausea and vomiting 79/07/4095   Acute metabolic encephalopathy 35/32/9924   Normocytic anemia 07/27/2021   Chronic back pain 07/27/2021   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[place]) 06/22/2021   Closed fracture of first lumbar vertebra with routine healing 02/03/2021   Closed fracture of multiple ribs 11/18/2020   Anxiety 01/29/2020   Leg pain, bilateral 01/29/2020   Ingrown toenail 07/13/2019   Lumbar spondylosis 05/02/2018   Pain in left knee 03/09/2018   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   Trochanteric bursitis of left hip 01/16/2018   Preventative health care 09/26/2017   HTN (hypertension) 07/19/2015   Hyperlipidemia 07/19/2015   Great toe pain 02/11/2014   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   Obesity (BMI 30-39.9) 06/25/2013   Renal insufficiency 06/25/2013   Weakness of left arm 06/25/2013   Sebaceous cyst 03/03/2011   Sprain of lumbar region 07/31/2010   Rib pain, left 08/29/2009   Carotid artery stenosis, asymptomatic, bilateral 05/02/2009   Eczema, atopic 05/31/2008   Vitamin D deficiency 03/01/2008   BPH (benign prostatic hyperplasia) 08/06/2007   Fasting hyperglycemia 12/21/2006   History of right MCA infarct 2006    ONSET DATE: 07/27/21  REFERRING DIAG: R41.4 (ICD-10-CM) - Hemi-inattention I63.9 (ICD-10-CM) - Cerebrovascular accident (CVA), unspecified mechanism ([place])   THERAPY DIAG:  Attention and concentration deficit  Other abnormalities of gait and mobility  Other lack of coordination  History of falling  Other symptoms and signs involving cognitive functions following cerebral  infarction  PERTINENT HISTORY: L basal ganglia and corona radiata infarct 07/27/21 (had documented AMS for about 5 days prior w/ hospital work-up including MRI negative and symptoms attributed to a UTI); L lower homonymous quadrantanopia. Other PMH includes R MCA infarct in 2006 w/ L-sided hemiparesis, NCD, carotid artery disease (bilateral), HTN, HLD,CKD3, and hx of bladder cancer  PRECAUTIONS: Fall risk  SUBJECTIVE:   SUBJECTIVE ASSESSMENT: "I feel like a performing monkey"  PAIN: Are you having pain? No   OBJECTIVE:   TODAY'S TREATMENT - 12/22/21: Able to complete supine-to-sit transition w/ Min A (pt completing 90%) and extended time and verbal/tactile cues for success Weight shifting to alternating sides while sitting EOM and weight bearing through ipsilateral UE w/ disengaging of contralateral RUE/LUE for cross-body reaching to retrieve therapy cones from floor level and place in a stack; completed 2 sets of 5 and 1 additional set of 5 wb through LUE. After initial repetitions w/ OT providing verbal and tactile cues for sequencing, pt demonstrated great carryover, correctly  wb through ipsilateral UE w/ crossbody reaching. No difficulty or pain wb through LUE. Multi-level dynamic reaching in diagonal patterns w/ alternating R and L UE while sitting EOM; completed x15 to each side w/ OT encouraging lateral weight shift, focus on L shoulder/elbow ROM and L-sided awareness  BUE ball toss w/ unweighted ball while identifying animals based on letter facing patient after each catch. OT provided verbal cue to "use both hands and find a letter" after about 3 reps w/ attention decreasing w/ increased repetition Attempted to fasten shirt buttons on tabletop surface to address BUE integration during functional tasks, L-sided awareness, and attention to task. Pt required frequent redirection to task, but was able to fasten 1 button w/ Mod Ind and significant extended time and 2nd button w/ Mod A. Good  spontaneous use of LUE.   PATIENT EDUCATION: Ongoing caregiver education, particularly related to cues and facilitation for increased pt participation. Discussed potential modification of incorporating external cue during daily walks to improve pt's sense of control and provide structure for improved safety and engagement Person educated: Patient and Spouse Education method: Explanation Education comprehension: verbalized understanding   GOALS: Goals reviewed with patient? Yes   SHORT TERM GOALS: Target date: 12/29/2021   STG   Status:  1 Pt/caregiver will verbalize understanding of visual perceptual compensatory strategies Baseline: Decreased knowledge of visual deficits and strategies Progressing  2 Pt will complete symbol cancellation task w/ at least 50% accuracy using visual compensatory strategies to indicate improved attention Baseline: 40% accuracy w/ multiple errors Met - 11/17/21  3 Pt will participate in bilateral coordination activity w/ no more than 3 cues for incorporation of LUE Baseline: L-sided inattention Met - 12/22/21: completed task w/ no cues for bil integration      LONG TERM GOALS: Target date: 01/13/2022   LTG   Status:  1 Pt will be able to don overhead shirt w/ SPV and verbal/gestural cues prn in at least 1 attempt Baseline: Min A to don/doff shirt sleeve on L side Progressing  2 Pt will be able to thread bottoms on BLEs or don socks w/ Mod A Baseline: Max A for LB dressing Progressing  3 Pt will demonstrate supine-to-sit transition w/ cues from caregiver and incorporation of AE/DME prn Baseline: Mod-Max A w/ bed mobility Met - 12/03/21; SPV w/ bed ladder and cues  4 Pt will be able to use TV remote (or adaptive remote) to complete 1 task (e.g., turn TV on/of, turn volume up/down) w/ SPV Baseline: Difficulty  Not Progressing - d/c due to continued difficulty and agitation w/ task at home  5 Pt will increase Barthel Index score by at least 5 to indicate improved  level of participation in BADLs Baseline: 55/100 Progressing  6 Pt will be able to make a peanut butter and jelly sandwich w/ SPV and verbal/tactile cues prn Progressing     ASSESSMENT:   CLINICAL IMPRESSION: Pt continues to demonstrate improvement in L-sided awareness and incorporation of LUE at non-dominant level w/ pt's wife demonstrating great understanding of learned strategies to increase pt's independence and sense of control during functional tasks at home. This session, OT returned to attempt weight bearing for increased proprioceptive input and awareness through LUE. Pt able to shift weight to L side and wb through LUE for the first time in OT during this course of OP therapies. OT also continued to address NMR of [place] and incorporated dual tasking and overall attention to task as well. Attention and perseveration have  improved, but continue to be limiting, particularly w/ distracting stimuli and increased fatigue.   PERFORMANCE DEFICITS in functional skills including ADLs, IADLs, coordination, dexterity, [place], GMC, mobility, balance, and vision, cognitive skills including attention, memory, orientation, problem solving, safety awareness, sequencing, and temperament/personality, and psychosocial skills including environmental adaptation, interpersonal interactions, and routines and behaviors.    IMPAIRMENTS are limiting patient from ADLs, IADLs, rest and sleep, leisure, and social participation.    COMORBIDITIES may have co-morbidities that affects occupational performance. Patient will benefit from skilled OT to address above impairments and improve overall function.     PLAN: OT FREQUENCY: 2x/week   OT DURATION: 10 weeks   PLANNED INTERVENTIONS: self care/ADL training, therapeutic exercise, therapeutic activity, neuromuscular re-education, balance training, functional mobility training, aquatic therapy, splinting, moist heat, cryotherapy, patient/family education, cognitive  remediation/compensation, visual/perceptual remediation/compensation, psychosocial skills training, energy conservation, and DME and/or AE instructions   RECOMMENDED OTHER SERVICES: Currently receiving PT services at this location   CONSULTED AND AGREED WITH PLAN OF CARE: Patient and family member/caregiver   PLAN FOR NEXT SESSION: Preparatory exercise(s) for L-sided attention, proprioception and kinesthetic sense prior to practice of ADL tasks   Kathrine Cords, MSOT, OTR/L 12/22/21, 4:25 PM

## 2021-12-22 NOTE — Therapy (Signed)
[name] [place], [name], [address] Phone: [phone]   Fax:  [phone]  Physical Therapy Treatment  Patient Details  Name: William [name] MRN: [id number] Date of Birth: 1941-03-18 Referring Provider (PT): Supple   Encounter Date: 12/22/2021   PT End of Session - 12/22/21 1541     Visit Number 21    Date for PT Re-Evaluation 12/31/21    PT Start Time 4270    PT Stop Time 1625    PT Time Calculation (min) 40 min    Equipment Utilized During Treatment Gait belt    Activity Tolerance Patient tolerated treatment well;No increased pain    Behavior During Therapy Flat affect;Anxious;Restless              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   Mild, related to stroke history   Nocturia    Renal insufficiency 06/25/2013   S/P carotid endarterectomy    BILATERAL ICA--  PATENT PER DUPLEX  05-19-2012   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   Right calf   Urinary frequency    Vitamin D deficiency     Past Surgical History:  Procedure Laterality Date   APPENDECTOMY  AS CHILD   CARDIOVASCULAR STRESS TEST  03-27-2012  DR CRENSHAW   LOW RISK LEXISCAN STUDY-- PROBABLE NORMAL PERFUSION AND SOFT TISSUE ATTENUATION/  NO ISCHEMIA/ EF 51%   CAROTID ENDARTERECTOMY Bilateral LEFT  11-12-2008  DR  GREG HAYES   RIGHT ICA  2006  ([facility])   CYSTOSCOPY W/ RETROGRADES Bilateral 06/22/2021   Procedure: CYSTOSCOPY WITH RETROGRADE PYELOGRAM;  Surgeon: Franchot Gallo, MD;  Location: [facility];  Service: Urology;  Laterality: Bilateral;   CYSTOSCOPY WITH LITHOLAPAXY N/A 02/26/2013   Procedure: CYSTOSCOPY WITH LITHOLAPAXY;  Surgeon: Franchot Gallo, MD;  Location: [facility];  Service: Urology;  Laterality: N/A;   EYE SURGERY  Jan. 2016   cataract surgery both eyes   INGUINAL HERNIA REPAIR Right 11-08-2006   IR KYPHO EA ADDL LEVEL THORACIC OR LUMBAR  02/12/2021   IR RADIOLOGIST EVAL & MGMT  02/18/2021   MASS EXCISION N/A 03/03/2016   Procedure: EXCISION OF BACK  MASS;  Surgeon: Stark Klein, MD;  Location: [facility];  Service: General;  Laterality: N/A;   MOHS SURGERY Left 1/ 2016   Dr Nevada Crane-- Basal cell   PROSTATE SURGERY     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-C N/A 06/22/2021   Procedure: TRANSURETHRAL RESECTION OF BLADDER TUMOR;  Surgeon: Franchot Gallo, MD;  Location: [facility];  Service: Urology;  Laterality: N/A;   TRANSURETHRAL RESECTION OF PROSTATE N/A 02/26/2013   Procedure: TRANSURETHRAL RESECTION OF THE PROSTATE WITH GYRUS INSTRUMENTS;  Surgeon: Franchot Gallo, MD;  Location:  [place];  Service: Urology;  Laterality: N/A;   TRANSURETHRAL RESECTION OF PROSTATE N/A 06/22/2021   Procedure: TRANSURETHRAL RESECTION OF THE PROSTATE (TURP);  Surgeon: Franchot Gallo, MD;  Location: [facility];  Service: Urology;  Laterality: N/A;    There were no vitals filed for this visit.   Subjective Assessment - 12/22/21 1547     Subjective Patient states "I feel good and strong, and no falls."    Pertinent History CVA's    Limitations Standing;Walking;House hold activities    How long can you walk comfortably? 2 minutes    Patient Stated Goals walk beter, move better, no  falls    Pain Onset 1 to 4 weeks ago               STS w/push out 2x10  Reaching behind for cone w/trunk rotation x6 each side Standing marches 2# 2x10 LAQ 2# x10 HS curls greenTB 2x10 each  Heel taps on cone w/1HHA Bicep curls 5# 2x10 each OHP yellow ball 2x10  Walking hold yellow ball 3 laps x2                           PT Short Term Goals - 12/14/21 1451       PT SHORT TERM GOAL #1   Title independent with initial HEP    Time 2    Period Weeks    Status On-going               PT Long Term Goals - 12/14/21 1451       PT LONG TERM GOAL #1   Title Understand fall risks, the importance of exercise and flexibility to health and function    Status Partially Met      PT LONG TERM GOAL #2   Title decrease TUG time to 13 seconds    Status On-going      PT LONG TERM GOAL #3   Title walk around our building without rest     Status On-going      PT LONG TERM GOAL #4   Title increase LE strength to 4+/5    Status On-going                   Plan - 12/22/21 1624     Clinical Impression Statement Patient continues to present with gait abnormalities and high fall risk. We worked on general strengthening exercises involving both lower and upper extremities. He requires constant cueing for all activities. Has trouble following tasks with multiple steps. Continue with strength and balance each visit.    PT Next Visit Plan continue to work on balance functional mobility, safety and treat shoulder for pain and ROM as we can    Consulted and Agree with Plan of Care Patient              Patient will benefit from skilled therapeutic intervention in order to improve the following deficits and impairments:  Abnormal gait, Decreased coordination, Decreased range of motion, Difficulty walking, Increased muscle spasms, Cardiopulmonary status limiting activity, Decreased endurance, Decreased activity tolerance, Pain, Impaired flexibility,  Decreased balance, Decreased mobility, Decreased strength, Postural dysfunction, Increased edema  Visit Diagnosis: Other abnormalities of gait and mobility  Other lack of coordination  Hemiplegia and hemiparesis following cerebral infarction affecting left non-dominant side (HCC)  Other symptoms and signs involving cognitive functions following cerebral infarction  Difficulty in walking, not elsewhere classified  Muscle  weakness (generalized)  History of falling  Acute pain of left shoulder  Cognitive communication deficit     Problem List Patient Active Problem List   Diagnosis Date Noted   Acute pain of left shoulder 11/12/2021   Leukocytes in urine 11/12/2021   Urinary frequency 11/12/2021   Thrush 10/08/2021   Hemiplegia, dominant side S/P CVA (cerebrovascular accident) ([place]) 09/11/2021   Insomnia    Prediabetes    Chronic kidney disease (CKD), stage IV (severe) ([place])    Basal ganglia infarction ([place]) 07/29/2021   Transaminitis 07/27/2021   UTI (urinary tract infection) 07/27/2021   CVA (cerebral vascular accident) ([place]) 07/27/2021   Fall at home, initial encounter 07/27/2021   Hyperglycemia 07/27/2021   CKD (chronic kidney disease), stage IV ([place]) 07/27/2021   Cholelithiasis 07/27/2021   Hypoxia 07/27/2021   Nausea and vomiting 06/16/7251   Acute metabolic encephalopathy 66/44/0347   Normocytic anemia 07/27/2021   Chronic back pain 07/27/2021   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[place]) 06/22/2021   Closed fracture of first lumbar vertebra with routine healing 02/03/2021   Closed fracture of multiple ribs 11/18/2020   Anxiety 01/29/2020   Leg pain, bilateral 01/29/2020   Ingrown toenail 07/13/2019   Lumbar spondylosis 05/02/2018   Pain in left knee 03/09/2018   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   Trochanteric bursitis of left hip 01/16/2018   Preventative health care 09/26/2017   HTN (hypertension) 07/19/2015   Hyperlipidemia  07/19/2015   Great toe pain 02/11/2014   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 [place], [name], [address] Phone: [phone]   Fax:  [phone]  Name: William [name] MRN: [id number] Date of Birth: 07-09-1940

## 2021-12-24 ENCOUNTER — Ambulatory Visit: Payer: PPO | Admitting: Physical Therapy

## 2021-12-24 ENCOUNTER — Encounter: Payer: Self-pay | Admitting: Occupational Therapy

## 2021-12-24 ENCOUNTER — Ambulatory Visit: Payer: PPO | Admitting: Occupational Therapy

## 2021-12-24 ENCOUNTER — Encounter: Payer: Self-pay | Admitting: Physical Therapy

## 2021-12-24 DIAGNOSIS — I69354 Hemiplegia and hemiparesis following cerebral infarction affecting left non-dominant side: Secondary | ICD-10-CM

## 2021-12-24 DIAGNOSIS — R41840 Attention and concentration deficit: Secondary | ICD-10-CM | POA: Diagnosis not present

## 2021-12-24 DIAGNOSIS — R262 Difficulty in walking, not elsewhere classified: Secondary | ICD-10-CM

## 2021-12-24 DIAGNOSIS — R2689 Other abnormalities of gait and mobility: Secondary | ICD-10-CM

## 2021-12-24 DIAGNOSIS — Z9181 History of falling: Secondary | ICD-10-CM

## 2021-12-24 DIAGNOSIS — R278 Other lack of coordination: Secondary | ICD-10-CM

## 2021-12-24 DIAGNOSIS — M6281 Muscle weakness (generalized): Secondary | ICD-10-CM

## 2021-12-24 DIAGNOSIS — R296 Repeated falls: Secondary | ICD-10-CM

## 2021-12-24 DIAGNOSIS — I69318 Other symptoms and signs involving cognitive functions following cerebral infarction: Secondary | ICD-10-CM

## 2021-12-24 NOTE — Therapy (Signed)
OUTPATIENT OCCUPATIONAL THERAPY TREATMENT NOTE   Patient Name: William [name] MRN: [id number] DOB:07-26-1940, 81 y.o., male Today's Date: 12/24/2021  PCP: Roma Schanz, DO REFERRING PROVIDER: Carollee Herter, Alferd Apa, DO   OT End of Session - 12/24/21 1457     Visit Number 18    Number of Visits 21    Date for OT Re-Evaluation 01/13/22    Authorization Type [employer]    Authorization Time Period VL: [place]    Progress Note Due on Visit 20    OT Start Time 1445    OT Stop Time 1530    OT Time Calculation (min) 45 min    Activity Tolerance Patient tolerated treatment well    Behavior During Therapy Flat affect;Anxious;Restless             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   Mild, related to stroke history   Nocturia    Renal insufficiency 06/25/2013   S/P carotid endarterectomy    BILATERAL ICA--  PATENT PER DUPLEX  05-19-2012   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   Right calf   Urinary frequency    Vitamin D deficiency    Past Surgical History:  Procedure Laterality Date   APPENDECTOMY  AS CHILD   CARDIOVASCULAR STRESS TEST  03-27-2012  DR CRENSHAW   LOW RISK LEXISCAN STUDY-- PROBABLE NORMAL PERFUSION AND SOFT TISSUE ATTENUATION/  NO ISCHEMIA/ EF 51%   CAROTID ENDARTERECTOMY Bilateral LEFT  11-12-2008  DR GREG HAYES   RIGHT ICA   2006  ([facility])   CYSTOSCOPY W/ RETROGRADES Bilateral 06/22/2021   Procedure: CYSTOSCOPY WITH RETROGRADE PYELOGRAM;  Surgeon: Franchot Gallo, MD;  Location: [facility];  Service: Urology;  Laterality: Bilateral;   CYSTOSCOPY WITH LITHOLAPAXY N/A 02/26/2013   Procedure: CYSTOSCOPY WITH LITHOLAPAXY;  Surgeon: Franchot Gallo, MD;  Location: [facility];  Service: Urology;  Laterality: N/A;   EYE SURGERY  Jan. 2016   cataract surgery both eyes   INGUINAL HERNIA REPAIR Right 11-08-2006   IR KYPHO EA ADDL LEVEL THORACIC OR LUMBAR  02/12/2021   IR RADIOLOGIST EVAL & MGMT  02/18/2021   MASS EXCISION N/A 03/03/2016   Procedure: EXCISION OF BACK  MASS;  Surgeon: Stark Klein, MD;  Location: [place];  Service: General;  Laterality: N/A;   MOHS SURGERY Left 1/ 2016   Dr Nevada Crane-- Basal cell   PROSTATE SURGERY     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-C N/A 06/22/2021   Procedure: TRANSURETHRAL RESECTION OF BLADDER TUMOR;  Surgeon: Franchot Gallo, MD;  Location: [facility];  Service: Urology;  Laterality: N/A;   TRANSURETHRAL RESECTION OF PROSTATE N/A 02/26/2013   Procedure: TRANSURETHRAL RESECTION OF THE PROSTATE WITH GYRUS INSTRUMENTS;  Surgeon: Franchot Gallo,  MD;  Location: [place];  Service: Urology;  Laterality: N/A;   TRANSURETHRAL RESECTION OF PROSTATE N/A 06/22/2021   Procedure: TRANSURETHRAL RESECTION OF THE PROSTATE (TURP);  Surgeon: Franchot Gallo, MD;  Location: [facility];  Service: Urology;  Laterality: N/A;   Patient Active Problem List   Diagnosis Date Noted   Acute pain of left shoulder 11/12/2021   Leukocytes in urine 11/12/2021   Urinary frequency 11/12/2021   Thrush 10/08/2021   Hemiplegia, dominant side S/P CVA (cerebrovascular accident) ([place]) 09/11/2021   Insomnia    Prediabetes    Chronic kidney disease (CKD), stage IV (severe) ([place])    Basal ganglia  infarction ([place]) 07/29/2021   Transaminitis 07/27/2021   UTI (urinary tract infection) 07/27/2021   CVA (cerebral vascular accident) ([place]) 07/27/2021   Fall at home, initial encounter 07/27/2021   Hyperglycemia 07/27/2021   CKD (chronic kidney disease), stage IV ([place]) 07/27/2021   Cholelithiasis 07/27/2021   Hypoxia 07/27/2021   Nausea and vomiting 72/62/0355   Acute metabolic encephalopathy 97/41/6384   Normocytic anemia 07/27/2021   Chronic back pain 07/27/2021   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[place]) 06/22/2021   Closed fracture of first lumbar vertebra with routine healing 02/03/2021   Closed fracture of multiple ribs 11/18/2020   Anxiety 01/29/2020   Leg pain, bilateral 01/29/2020   Ingrown toenail 07/13/2019   Lumbar spondylosis 05/02/2018   Pain in left knee 03/09/2018   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   Trochanteric bursitis of left hip 01/16/2018   Preventative health care 09/26/2017   HTN (hypertension) 07/19/2015   Hyperlipidemia 07/19/2015   Great toe pain 02/11/2014   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   Obesity (BMI 30-39.9) 06/25/2013   Renal insufficiency 06/25/2013   Weakness of left arm 06/25/2013   Sebaceous cyst 03/03/2011   Sprain of lumbar region 07/31/2010   Rib pain, left 08/29/2009   Carotid artery stenosis, asymptomatic, bilateral 05/02/2009   Eczema, atopic 05/31/2008   Vitamin D deficiency 03/01/2008   BPH (benign prostatic hyperplasia) 08/06/2007   Fasting hyperglycemia 12/21/2006   History of right MCA infarct 2006    ONSET DATE: 07/27/21  REFERRING DIAG: R41.4 (ICD-10-CM) - Hemi-inattention I63.9 (ICD-10-CM) - Cerebrovascular accident (CVA), unspecified mechanism ([place])   THERAPY DIAG:  Attention and concentration deficit  Other lack of coordination  History of falling  Other abnormalities of gait and mobility  Other symptoms and signs involving cognitive functions following cerebral  infarction  PERTINENT HISTORY: L basal ganglia and corona radiata infarct 07/27/21 (had documented AMS for about 5 days prior w/ hospital work-up including MRI negative and symptoms attributed to a UTI); L lower homonymous quadrantanopia. Other PMH includes R MCA infarct in 2006 w/ L-sided hemiparesis, NCD, carotid artery disease (bilateral), HTN, HLD,CKD3, and hx of bladder cancer  PRECAUTIONS: Fall risk  SUBJECTIVE:   SUBJECTIVE ASSESSMENT: Pt and his spouse report having a fall yesterday during one of their daily walks, but that w/ UE support, he was able to get back up  PAIN: Are you having pain? No   OBJECTIVE:   TODAY'S TREATMENT - 12/24/21: Preparatory activity w/ pt identifying part of his face both on his person and using the mirror positioned in front of him; activity targeted body schema awareness for improved participation in shaving at home. Also simulated activity w/ pt able to target all necessary areas of his face w/ min cues for thoroughness Engaged in dynamic weight shifting w/ reaching ipsilaterally toward alternating  R and L sides for NMR of GMC, core activation, and kinesthetic sense. OT incorporated visual aid (green dots) to facilitate pt back to midline each rep w/ OT grading activity prn. Increased/consistent reach improved w/ visual target toward each side. Able to self-correct to midline w/ only verbal cues. OT also discussed recommendation to incorporate visual aid to assist w/ correction of pt leaning toward L side while seated at the kitchen table at home w/ pt's spouse who verbalized understanding Doffing/donning open-front shirt w/ OT incorporating errorless learning, extended time, and chaining to facilitate success. Completed both donning and doffing overall w/ Mod A across multiple attempts. Pt frequently got "stuck;: attention limitations may be a factor. Most difficulty w/ doffing occurs when first pulling arms out of sleeves and most difficulty w/ donning  occurs when reaching behind to pull collar over to R side to thread RUE after threading LUE   PATIENT EDUCATION: Ongoing caregiver education, particularly related to cues and facilitation for increased pt participation Person educated: Patient and Spouse Education method: Explanation Education comprehension: verbalized understanding   GOALS: Goals reviewed with patient? Yes   SHORT TERM GOALS: Target date: 12/29/2021   STG   Status:  1 Pt/caregiver will verbalize understanding of visual perceptual compensatory strategies Baseline: Decreased knowledge of visual deficits and strategies Progressing  2 Pt will complete symbol cancellation task w/ at least 50% accuracy using visual compensatory strategies to indicate improved attention Baseline: 40% accuracy w/ multiple errors Met - 11/17/21  3 Pt will participate in bilateral coordination activity w/ no more than 3 cues for incorporation of LUE Baseline: L-sided inattention Met - 12/22/21: completed task w/ no cues for bil integration      LONG TERM GOALS: Target date: 01/13/2022   LTG   Status:  1 Pt will be able to don overhead shirt w/ SPV and verbal/gestural cues prn in at least 1 attempt Baseline: Min A to don/doff shirt sleeve on L side Progressing  2 Pt will be able to thread bottoms on BLEs or don socks w/ Mod A Baseline: Max A for LB dressing Progressing  3 Pt will demonstrate supine-to-sit transition w/ cues from caregiver and incorporation of AE/DME prn Baseline: Mod-Max A w/ bed mobility Met - 12/03/21; SPV w/ bed ladder and cues  4 Pt will be able to use TV remote (or adaptive remote) to complete 1 task (e.g., turn TV on/of, turn volume up/down) w/ SPV Baseline: Difficulty  Not Progressing - d/c due to continued difficulty and agitation w/ task at home  5 Pt will increase Barthel Index score by at least 5 to indicate improved level of participation in BADLs Baseline: 55/100 Progressing  6 Pt will be able to make a peanut butter  and jelly sandwich w/ SPV and verbal/tactile cues prn Progressing     ASSESSMENT:   CLINICAL IMPRESSION: Pt continues to demonstrate improvement in L-sided awareness and incorporation of LUE at non-dominant level w/ pt's wife demonstrating great understanding of learned strategies to increase pt's independence and sense of control during functional tasks at home. OT continued to focus session on foundational skills for improved safety w/ increased awareness and attention during functional tasks. Pt continues to respond well to exercises targeting proprioception and kinesthetic sense w/ OT using these exercises to target specific functional tasks pt frequently experiences difficulty w/ at home.   PERFORMANCE DEFICITS in functional skills including ADLs, IADLs, coordination, dexterity, [place], GMC, mobility, balance, and vision, cognitive skills including attention, memory, orientation, problem solving,  safety awareness, sequencing, and temperament/personality, and psychosocial skills including environmental adaptation, interpersonal interactions, and routines and behaviors.    IMPAIRMENTS are limiting patient from ADLs, IADLs, rest and sleep, leisure, and social participation.    COMORBIDITIES may have co-morbidities that affects occupational performance. Patient will benefit from skilled OT to address above impairments and improve overall function.     PLAN: OT FREQUENCY: 2x/week   OT DURATION: 10 weeks   PLANNED INTERVENTIONS: self care/ADL training, therapeutic exercise, therapeutic activity, neuromuscular re-education, balance training, functional mobility training, aquatic therapy, splinting, moist heat, cryotherapy, patient/family education, cognitive remediation/compensation, visual/perceptual remediation/compensation, psychosocial skills training, energy conservation, and DME and/or AE instructions   RECOMMENDED OTHER SERVICES: Currently receiving PT services at this location   CONSULTED  AND AGREED WITH PLAN OF CARE: Patient and family member/caregiver   PLAN FOR NEXT SESSION: Preparatory exercise(s) for L-sided attention, proprioception and kinesthetic sense prior to practice of ADL tasks   Kathrine Cords, MSOT, OTR/L 12/24/21, 2:57 PM

## 2021-12-24 NOTE — Therapy (Signed)
[place]. [place], [name], [address] Phone: [phone]   Fax:  [phone]  Physical Therapy Treatment  Patient Details  Name: William [name] MRN: [id number] Date of Birth: October 12, 1940 Referring Provider (PT): Supple   Encounter Date: 12/24/2021   PT End of Session - 12/24/21 1406     Visit Number 22    Date for PT Re-Evaluation 12/31/21    Authorization Type HTA    PT Start Time 1400    PT Stop Time 1445    PT Time Calculation (min) 45 min    Equipment Utilized During Treatment Gait belt    Activity Tolerance Patient tolerated treatment well    Behavior During Therapy Flat affect;Anxious;Restless             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   Mild, related to stroke history   Nocturia    Renal insufficiency 06/25/2013   S/P carotid endarterectomy    BILATERAL ICA--  PATENT PER DUPLEX  05-19-2012   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   Right calf   Urinary frequency    Vitamin D deficiency     Past Surgical History:  Procedure Laterality Date   APPENDECTOMY  AS CHILD   CARDIOVASCULAR STRESS TEST  03-27-2012  DR CRENSHAW   LOW RISK LEXISCAN STUDY-- PROBABLE NORMAL PERFUSION AND SOFT TISSUE ATTENUATION/  NO ISCHEMIA/ EF 51%   CAROTID ENDARTERECTOMY Bilateral LEFT   11-12-2008  DR GREG HAYES   RIGHT ICA  2006  ([facility])   CYSTOSCOPY W/ RETROGRADES Bilateral 06/22/2021   Procedure: CYSTOSCOPY WITH RETROGRADE PYELOGRAM;  Surgeon: Franchot Gallo, MD;  Location: [facility];  Service: Urology;  Laterality: Bilateral;   CYSTOSCOPY WITH LITHOLAPAXY N/A 02/26/2013   Procedure: CYSTOSCOPY WITH LITHOLAPAXY;  Surgeon: Franchot Gallo, MD;  Location: [facility];  Service: Urology;  Laterality: N/A;   EYE SURGERY  Jan. 2016   cataract surgery both eyes   INGUINAL HERNIA REPAIR Right 11-08-2006   IR KYPHO EA ADDL LEVEL THORACIC OR LUMBAR  02/12/2021   IR RADIOLOGIST EVAL & MGMT  02/18/2021   MASS EXCISION N/A 03/03/2016   Procedure: EXCISION OF BACK  MASS;  Surgeon: Stark Klein, MD;  Location: [place];  Service: General;  Laterality: N/A;   MOHS SURGERY Left 1/ 2016   Dr Nevada Crane-- Basal cell   PROSTATE SURGERY     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-C N/A 06/22/2021   Procedure: TRANSURETHRAL RESECTION OF BLADDER TUMOR;  Surgeon: Franchot Gallo, MD;  Location: [facility];  Service: Urology;  Laterality: N/A;   TRANSURETHRAL RESECTION OF PROSTATE N/A 02/26/2013   Procedure: TRANSURETHRAL RESECTION OF THE PROSTATE WITH GYRUS INSTRUMENTS;  Surgeon: Franchot Gallo,  MD;  Location: [place];  Service: Urology;  Laterality: N/A;   TRANSURETHRAL RESECTION OF PROSTATE N/A 06/22/2021   Procedure: TRANSURETHRAL RESECTION OF THE PROSTATE (TURP);  Surgeon: Franchot Gallo, MD;  Location: [facility];  Service: Urology;  Laterality: N/A;    There were no vitals filed for this visit.   Subjective Assessment - 12/24/21 1408     Subjective Wife reports a fall last night, they were walking and she has him stop when he gets tired and when he gets out of control, she reports he fell on his back side as he got out of control and was trying to stop, he reports some  pain in the left buttock, wife is requesting more visits.  We talked about at first he was not tolerant of the visits with the shoulder pain and with the cognition, over the past few weeks he has done better with this    Currently in Pain? Yes    Pain Score 6     Pain Location Buttocks    Pain Orientation Left    Pain Descriptors / Indicators Sore    Pain Relieving Factors falling hurt                               [facility] Adult PT Treatment/Exercise - 12/24/21 0001       Transfers   Comments side scoots, fwd and backward scoots      Ambulation/Gait   Gait Comments 4 x 100 cues for step length and posture and to be slow      High Level Balance   High Level Balance Activities Side stepping;Backward walking;Direction changes    High Level Balance Comments high toe touches, seated on pball bouncing, hip motions and balance, seated back not supported beach ball, side stepping and forward stepping over obstacles      Lumbar Exercises: Aerobic   Nustep levewl 5 x 5 minutes      Lumbar Exercises: Supine   Other Supine Lumbar Exercises bed mobility side to side hips                       PT Short Term Goals - 12/14/21 1451       PT SHORT TERM GOAL #1   Title independent with initial HEP    Time 2    Period Weeks    Status On-going               PT Long Term Goals - 12/14/21 1451       PT LONG TERM GOAL #1   Title Understand fall risks, the importance of exercise and flexibility to health and function    Status Partially Met      PT LONG TERM GOAL #2   Title decrease TUG time to 13 seconds    Status On-going      PT LONG TERM GOAL #3   Title walk around our building without rest     Status On-going      PT LONG TERM GOAL #4   Title increase LE strength to 4+/5    Status On-going                   Plan - 12/24/21 1523     Clinical Impression Statement Patient had a fall yesterday while walking, wife reports taht he  was getting out of control with the forward  lean and the shuffling gait, was trying to get him to stop and when he did he continued down, he does have pain in the buttock today, he is upset that he fell and reports that he wants to get better.  He tends to really not be able to slow or stop, he does perseverate on things and at time I do need to have him stop completely and refocus on me and then we will start again.  His attention is better as most activities that last two visits have been out in tthe busy and open gym    PT Next Visit Plan work on blance and gait, safety education for him and his wife, next week may need renewal may increase frequency    Consulted and Agree with Plan of Care Patient             Patient will benefit from skilled therapeutic intervention in order to improve the following deficits and impairments:  Abnormal gait, Decreased coordination, Decreased range of motion, Difficulty walking, Increased muscle spasms, Cardiopulmonary status limiting activity, Decreased endurance, Decreased activity tolerance, Pain, Impaired flexibility, Decreased balance, Decreased mobility, Decreased strength, Postural dysfunction, Increased edema  Visit Diagnosis: Other abnormalities of gait and mobility  History of falling  Hemiplegia and hemiparesis following cerebral infarction affecting left non-dominant side (HCC)  Difficulty in walking, not elsewhere classified  Muscle weakness (generalized)  Repeated falls     Problem List Patient Active Problem List   Diagnosis Date Noted   Acute pain of left shoulder 11/12/2021   Leukocytes in urine 11/12/2021   Urinary frequency 11/12/2021   Thrush 10/08/2021   Hemiplegia, dominant side S/P CVA (cerebrovascular accident) ([place]) 09/11/2021   Insomnia    Prediabetes    Chronic kidney disease (CKD), stage IV (severe) (HCC)    Basal ganglia infarction ([place]) 07/29/2021   Transaminitis 07/27/2021   UTI (urinary tract infection)  07/27/2021   CVA (cerebral vascular accident) ([place]) 07/27/2021   Fall at home, initial encounter 07/27/2021   Hyperglycemia 07/27/2021   CKD (chronic kidney disease), stage IV ([place]) 07/27/2021   Cholelithiasis 07/27/2021   Hypoxia 07/27/2021   Nausea and vomiting 07/37/1062   Acute metabolic encephalopathy 69/48/5462   Normocytic anemia 07/27/2021   Chronic back pain 07/27/2021   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[place]) 06/22/2021   Closed fracture of first lumbar vertebra with routine healing 02/03/2021   Closed fracture of multiple ribs 11/18/2020   Anxiety 01/29/2020   Leg pain, bilateral 01/29/2020   Ingrown toenail 07/13/2019   Lumbar spondylosis 05/02/2018   Pain in left knee 03/09/2018   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   Trochanteric bursitis of left hip 01/16/2018   Preventative health care 09/26/2017   HTN (hypertension) 07/19/2015   Hyperlipidemia 07/19/2015   Great toe pain 02/11/2014   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 [place], [name], [address] Phone: [phone]   Fax:  [phone]  Name: William Baltimore  DUKE [name] MRN: [id number] Date of Birth: [birth year]

## 2021-12-25 ENCOUNTER — Other Ambulatory Visit (INDEPENDENT_AMBULATORY_CARE_PROVIDER_SITE_OTHER): Payer: PPO

## 2021-12-25 DIAGNOSIS — R7989 Other specified abnormal findings of blood chemistry: Secondary | ICD-10-CM

## 2021-12-25 NOTE — Addendum Note (Signed)
Addended by: [name] on: 12/25/2021 02:32 PM   Modules accepted: Orders

## 2021-12-26 LAB — COMPREHENSIVE METABOLIC PANEL
AG Ratio: 1.3 (calc) (ref 1.0–2.5)
ALT: 17 U/L (ref 9–46)
AST: 14 U/L (ref 10–35)
Albumin: 3.7 g/dL (ref 3.6–5.1)
Alkaline phosphatase (APISO): 265 U/L — ABNORMAL HIGH (ref 35–144)
BUN/Creatinine Ratio: 15 (calc) (ref 6–22)
BUN: 25 mg/dL (ref 7–25)
CO2: 26 mmol/L (ref 20–32)
Calcium: 8.8 mg/dL (ref 8.6–10.3)
Chloride: 106 mmol/L (ref 98–110)
Creat: 1.71 mg/dL — ABNORMAL HIGH (ref 0.70–1.28)
Globulin: 2.8 g/dL (calc) (ref 1.9–3.7)
Glucose, Bld: 102 mg/dL — ABNORMAL HIGH (ref 65–99)
Potassium: 4.5 mmol/L (ref 3.5–5.3)
Sodium: 142 mmol/L (ref 135–146)
Total Bilirubin: 0.3 mg/dL (ref 0.2–1.2)
Total Protein: 6.5 g/dL (ref 6.1–8.1)

## 2021-12-26 LAB — GAMMA GT: GGT: 548 U/L — ABNORMAL HIGH (ref 3–70)

## 2021-12-28 ENCOUNTER — Ambulatory Visit: Payer: PPO | Admitting: Occupational Therapy

## 2021-12-28 ENCOUNTER — Encounter: Payer: Self-pay | Admitting: Family Medicine

## 2021-12-28 ENCOUNTER — Ambulatory Visit: Payer: PPO | Admitting: Physical Therapy

## 2021-12-28 ENCOUNTER — Encounter: Payer: Self-pay | Admitting: Physical Therapy

## 2021-12-28 ENCOUNTER — Encounter: Payer: Self-pay | Admitting: Occupational Therapy

## 2021-12-28 DIAGNOSIS — R296 Repeated falls: Secondary | ICD-10-CM

## 2021-12-28 DIAGNOSIS — M5430 Sciatica, unspecified side: Secondary | ICD-10-CM | POA: Diagnosis not present

## 2021-12-28 DIAGNOSIS — Z9181 History of falling: Secondary | ICD-10-CM

## 2021-12-28 DIAGNOSIS — S32019D Unspecified fracture of first lumbar vertebra, subsequent encounter for fracture with routine healing: Secondary | ICD-10-CM | POA: Diagnosis not present

## 2021-12-28 DIAGNOSIS — I69318 Other symptoms and signs involving cognitive functions following cerebral infarction: Secondary | ICD-10-CM

## 2021-12-28 DIAGNOSIS — M25512 Pain in left shoulder: Secondary | ICD-10-CM

## 2021-12-28 DIAGNOSIS — R278 Other lack of coordination: Secondary | ICD-10-CM

## 2021-12-28 DIAGNOSIS — R41840 Attention and concentration deficit: Secondary | ICD-10-CM

## 2021-12-28 DIAGNOSIS — G8929 Other chronic pain: Secondary | ICD-10-CM | POA: Diagnosis not present

## 2021-12-28 DIAGNOSIS — I69359 Hemiplegia and hemiparesis following cerebral infarction affecting unspecified side: Secondary | ICD-10-CM | POA: Diagnosis not present

## 2021-12-28 DIAGNOSIS — R2689 Other abnormalities of gait and mobility: Secondary | ICD-10-CM

## 2021-12-28 DIAGNOSIS — R262 Difficulty in walking, not elsewhere classified: Secondary | ICD-10-CM

## 2021-12-28 DIAGNOSIS — M6281 Muscle weakness (generalized): Secondary | ICD-10-CM

## 2021-12-28 DIAGNOSIS — R0902 Hypoxemia: Secondary | ICD-10-CM | POA: Diagnosis not present

## 2021-12-28 DIAGNOSIS — I69354 Hemiplegia and hemiparesis following cerebral infarction affecting left non-dominant side: Secondary | ICD-10-CM

## 2021-12-28 NOTE — Therapy (Signed)
[place] [place], [name], [address] Phone: [phone]   Fax:  [phone]  Physical Therapy Treatment  Patient Details  Name: William [name] MRN: [id number] Date of Birth: [birth year] Referring Provider (PT): Supple   Encounter Date: 12/28/2021   PT End of Session - 12/28/21 1542     Visit Number 23    Date for PT Re-Evaluation 12/31/21    Authorization Type HTA    PT Start Time 2111    PT Stop Time 1615    PT Time Calculation (min) 45 min    Activity Tolerance Patient tolerated treatment well    Behavior During Therapy [facility] for tasks assessed/performed;Flat affect;Impulsive             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   Mild, related to stroke history   Nocturia    Renal insufficiency 06/25/2013   S/P carotid endarterectomy    BILATERAL ICA--  PATENT PER DUPLEX  05-19-2012   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   Right calf   Urinary frequency    Vitamin D deficiency     Past Surgical History:  Procedure Laterality Date   APPENDECTOMY  AS CHILD   CARDIOVASCULAR STRESS TEST  03-27-2012  DR CRENSHAW   LOW RISK LEXISCAN STUDY-- PROBABLE NORMAL PERFUSION AND SOFT TISSUE ATTENUATION/  NO ISCHEMIA/ EF 51%   CAROTID ENDARTERECTOMY Bilateral LEFT  11-12-2008  DR GREG HAYES    RIGHT ICA  2006  ([facility])   CYSTOSCOPY W/ RETROGRADES Bilateral 06/22/2021   Procedure: CYSTOSCOPY WITH RETROGRADE PYELOGRAM;  Surgeon: Franchot Gallo, MD;  Location: [facility];  Service: Urology;  Laterality: Bilateral;   CYSTOSCOPY WITH LITHOLAPAXY N/A 02/26/2013   Procedure: CYSTOSCOPY WITH LITHOLAPAXY;  Surgeon: Franchot Gallo, MD;  Location: [facility];  Service: Urology;  Laterality: N/A;   EYE SURGERY  Jan. 2016   cataract surgery both eyes   INGUINAL HERNIA REPAIR Right 11-08-2006   IR KYPHO EA ADDL LEVEL THORACIC OR LUMBAR  02/12/2021   IR RADIOLOGIST EVAL & MGMT  02/18/2021   MASS EXCISION N/A 03/03/2016   Procedure: EXCISION OF BACK  MASS;  Surgeon: Stark Klein, MD;  Location: [place];  Service: General;  Laterality: N/A;   MOHS SURGERY Left 1/ 2016   Dr Nevada Crane-- Basal cell   PROSTATE SURGERY     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-C N/A 06/22/2021   Procedure: TRANSURETHRAL RESECTION OF BLADDER TUMOR;  Surgeon: Franchot Gallo, MD;  Location: [facility];  Service: Urology;  Laterality: N/A;   TRANSURETHRAL RESECTION OF PROSTATE N/A 02/26/2013   Procedure: TRANSURETHRAL RESECTION OF THE PROSTATE WITH GYRUS INSTRUMENTS;  Surgeon: Franchot Gallo, MD;  Location: [facility];  Service: Urology;  Laterality: N/A;   TRANSURETHRAL RESECTION OF PROSTATE N/A 06/22/2021   Procedure: TRANSURETHRAL RESECTION OF THE PROSTATE (TURP);  Surgeon: Franchot Gallo, MD;  Location: [facility];  Service: Urology;  Laterality: N/A;    There were no vitals filed for this visit.   Subjective Assessment - 12/28/21 1542     Subjective No falls, wife reports same difficulty with him walking, drags left LE and stoops forward to where she is afraid he will fall    Currently in Pain? No/denies                               [facility] Adult PT Treatment/Exercise -  12/28/21 0001       Ambulation/Gait   Gait Comments stairs up and down with hand rail, gait outside with gaitbelt and a lot of cues, required 2-3 stops to reset his steps, , had a lot of left knee flexion      High Level Balance   High Level Balance Comments high toe touches, seated on pball bouncing, hip motions and balance, seated back not supported beach ball, side stepping and forward stepping over obstacles, side stepping on and off 4" step      Lumbar Exercises: Aerobic   Elliptical level 1 x 1 minute  15 seconds with CGA and some Min A    Nustep levewl 5 x 5 minutes      Lumbar Exercises: Standing   Other Standing Lumbar Exercises hip abduction marches and extension 2# 2x10                       PT Short Term Goals - 12/14/21 1451       PT SHORT TERM GOAL #1   Title independent with initial HEP    Time 2    Period Weeks    Status On-going               PT Long Term Goals - 12/14/21 1451       PT LONG TERM GOAL #1   Title Understand fall risks, the importance of exercise and flexibility to health and function    Status Partially Met      PT LONG TERM GOAL #2   Title decrease TUG time to 13 seconds    Status On-going      PT LONG TERM GOAL #3   Title walk around our building without rest     Status On-going      PT LONG TERM GOAL #4   Title increase LE strength to 4+/5    Status On-going                   Plan - 12/28/21 1712     Clinical Impression Statement Pateint with no pain, reports that he has not had any falls, denies injury or soreness from the fall last week. again spoke with wife about the walking she reports that they are trying to walk daily.  She has concerns about the walking and the stooping.  He really needed redirection and to have him stop and then to continue with cues for big steps, he does well for aobut 100-150 feet and then starts to have issues.  Today one of the biggest issues was his left knee being bent  and unable to straighten    PT Next Visit Plan Will do renewal may bump  up to 3x/week    Consulted and Agree with Plan of Care Patient             Patient will benefit from skilled therapeutic intervention in order to improve the following deficits and impairments:  Abnormal gait, Decreased coordination, Decreased range of motion, Difficulty walking, Increased muscle spasms, Cardiopulmonary status limiting activity, Decreased endurance, Decreased activity tolerance, Pain, Impaired flexibility, Decreased balance, Decreased mobility, Decreased strength, Postural dysfunction, Increased edema  Visit Diagnosis: Other lack of coordination  History of falling  Other abnormalities of gait and mobility  Hemiplegia and hemiparesis following cerebral infarction affecting left non-dominant side (HCC)  Difficulty in walking, not elsewhere classified  Muscle weakness (generalized)  Repeated falls  Acute pain of left shoulder     Problem List Patient Active Problem List   Diagnosis Date Noted   Acute pain of left shoulder 11/12/2021   Leukocytes in urine 11/12/2021   Urinary frequency 11/12/2021   Thrush 10/08/2021   Hemiplegia, dominant side S/P CVA (cerebrovascular accident) ([place]) 09/11/2021   Insomnia    Prediabetes    Chronic kidney disease (CKD), stage IV (severe) (HCC)    Basal ganglia infarction ([place]) 07/29/2021   Transaminitis 07/27/2021   UTI (urinary tract infection) 07/27/2021   CVA (cerebral vascular accident) ([place]) 07/27/2021   Fall at home, initial encounter 07/27/2021   Hyperglycemia 07/27/2021   CKD (chronic kidney disease), stage IV ([place]) 07/27/2021   Cholelithiasis 07/27/2021   Hypoxia 07/27/2021   Nausea and vomiting 33/29/5188   Acute metabolic encephalopathy 41/66/0630   Normocytic anemia 07/27/2021   Chronic back pain 07/27/2021   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[place]) 06/22/2021   Closed fracture of first lumbar vertebra with routine  healing 02/03/2021   Closed fracture of multiple ribs 11/18/2020   Anxiety 01/29/2020   Leg pain, bilateral 01/29/2020   Ingrown toenail 07/13/2019   Lumbar spondylosis 05/02/2018   Pain in left knee 03/09/2018   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   Trochanteric bursitis of left hip 01/16/2018   Preventative health care 09/26/2017   HTN (hypertension) 07/19/2015   Hyperlipidemia 07/19/2015   Great toe pain 02/11/2014   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 [place], [name], [address] Phone: [phone]   Fax:  [phone]  Name: SHADRACH BARTUNEK MRN: [id number] Date of Birth: [birth year]

## 2021-12-28 NOTE — Therapy (Unsigned)
OUTPATIENT OCCUPATIONAL THERAPY TREATMENT NOTE   Patient Name: William [name] MRN: [id number] DOB:Jul 01, 1940, 81 y.o., male Today's Date: 12/28/2021  PCP: Roma Schanz, DO REFERRING PROVIDER: Carollee Herter, Alferd Apa, DO   OT End of Session - 12/28/21 1512     Visit Number 19    Number of Visits 21    Date for OT Re-Evaluation 01/13/22    Authorization Type [employer]    Authorization Time Period VL: MN    Progress Note Due on Visit 20    OT Start Time 1449    OT Stop Time 1530    OT Time Calculation (min) 41 min    Activity Tolerance Patient tolerated treatment well    Behavior During Therapy Flat affect;Anxious;Restless             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   Mild, related to stroke history   Nocturia    Renal insufficiency 06/25/2013   S/P carotid endarterectomy    BILATERAL ICA--  PATENT PER DUPLEX  05-19-2012   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   Right calf   Urinary frequency    Vitamin D deficiency    Past Surgical History:  Procedure Laterality Date   APPENDECTOMY  AS CHILD   CARDIOVASCULAR STRESS TEST  03-27-2012  DR CRENSHAW   LOW RISK LEXISCAN STUDY-- PROBABLE NORMAL PERFUSION AND SOFT TISSUE ATTENUATION/  NO ISCHEMIA/ EF 51%   CAROTID ENDARTERECTOMY Bilateral LEFT  11-12-2008  DR GREG HAYES   RIGHT ICA   2006  ([facility])   CYSTOSCOPY W/ RETROGRADES Bilateral 06/22/2021   Procedure: CYSTOSCOPY WITH RETROGRADE PYELOGRAM;  Surgeon: Franchot Gallo, MD;  Location: [facility];  Service: Urology;  Laterality: Bilateral;   CYSTOSCOPY WITH LITHOLAPAXY N/A 02/26/2013   Procedure: CYSTOSCOPY WITH LITHOLAPAXY;  Surgeon: Franchot Gallo, MD;  Location: [facility];  Service: Urology;  Laterality: N/A;   EYE SURGERY  Jan. 2016   cataract surgery both eyes   INGUINAL HERNIA REPAIR Right 11-08-2006   IR KYPHO EA ADDL LEVEL THORACIC OR LUMBAR  02/12/2021   IR RADIOLOGIST EVAL & MGMT  02/18/2021   MASS EXCISION N/A 03/03/2016   Procedure: EXCISION OF BACK  MASS;  Surgeon: Stark Klein, MD;  Location: [place];  Service: General;  Laterality: N/A;   MOHS SURGERY Left 1/ 2016   Dr Nevada Crane-- Basal cell   PROSTATE SURGERY     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-C N/A 06/22/2021   Procedure: TRANSURETHRAL RESECTION OF BLADDER TUMOR;  Surgeon: Franchot Gallo, MD;  Location: [facility];  Service: Urology;  Laterality: N/A;   TRANSURETHRAL RESECTION OF PROSTATE N/A 02/26/2013   Procedure: TRANSURETHRAL RESECTION OF THE PROSTATE WITH GYRUS INSTRUMENTS;  Surgeon: Franchot Gallo,  MD;  Location: [place];  Service: Urology;  Laterality: N/A;   TRANSURETHRAL RESECTION OF PROSTATE N/A 06/22/2021   Procedure: TRANSURETHRAL RESECTION OF THE PROSTATE (TURP);  Surgeon: Franchot Gallo, MD;  Location: [facility];  Service: Urology;  Laterality: N/A;   Patient Active Problem List   Diagnosis Date Noted   Acute pain of left shoulder 11/12/2021   Leukocytes in urine 11/12/2021   Urinary frequency 11/12/2021   Thrush 10/08/2021   Hemiplegia, dominant side S/P CVA (cerebrovascular accident) ([place]) 09/11/2021   Insomnia    Prediabetes    Chronic kidney disease (CKD), stage IV (severe) ([place])    Basal ganglia  infarction ([place]) 07/29/2021   Transaminitis 07/27/2021   UTI (urinary tract infection) 07/27/2021   CVA (cerebral vascular accident) ([place]) 07/27/2021   Fall at home, initial encounter 07/27/2021   Hyperglycemia 07/27/2021   CKD (chronic kidney disease), stage IV ([place]) 07/27/2021   Cholelithiasis 07/27/2021   Hypoxia 07/27/2021   Nausea and vomiting 89/21/1941   Acute metabolic encephalopathy 74/01/1447   Normocytic anemia 07/27/2021   Chronic back pain 07/27/2021   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[place]) 06/22/2021   Closed fracture of first lumbar vertebra with routine healing 02/03/2021   Closed fracture of multiple ribs 11/18/2020   Anxiety 01/29/2020   Leg pain, bilateral 01/29/2020   Ingrown toenail 07/13/2019   Lumbar spondylosis 05/02/2018   Pain in left knee 03/09/2018   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   Trochanteric bursitis of left hip 01/16/2018   Preventative health care 09/26/2017   HTN (hypertension) 07/19/2015   Hyperlipidemia 07/19/2015   Great toe pain 02/11/2014   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   Obesity (BMI 30-39.9) 06/25/2013   Renal insufficiency 06/25/2013   Weakness of left arm 06/25/2013   Sebaceous cyst 03/03/2011   Sprain of lumbar region 07/31/2010   Rib pain, left 08/29/2009   Carotid artery stenosis, asymptomatic, bilateral 05/02/2009   Eczema, atopic 05/31/2008   Vitamin D deficiency 03/01/2008   BPH (benign prostatic hyperplasia) 08/06/2007   Fasting hyperglycemia 12/21/2006   History of right MCA infarct 2006    ONSET DATE: 07/27/21  REFERRING DIAG: R41.4 (ICD-10-CM) - Hemi-inattention I63.9 (ICD-10-CM) - Cerebrovascular accident (CVA), unspecified mechanism ([place])   THERAPY DIAG:  Attention and concentration deficit  Other lack of coordination  History of falling  Other abnormalities of gait and mobility  Other symptoms and signs involving cognitive functions following cerebral  infarction  PERTINENT HISTORY: L basal ganglia and corona radiata infarct 07/27/21 (had documented AMS for about 5 days prior w/ hospital work-up including MRI negative and symptoms attributed to a UTI); L lower homonymous quadrantanopia. Other PMH includes R MCA infarct in 2006 w/ L-sided hemiparesis, NCD, carotid artery disease (bilateral), HTN, HLD,CKD3, and hx of bladder cancer  PRECAUTIONS: Fall risk  SUBJECTIVE:   SUBJECTIVE ASSESSMENT: "I don't see any more hair"  PAIN: Are you having pain? No   OBJECTIVE:   TODAY'S TREATMENT - 12/28/21:  Grooming Tasks Pt's wife brought in pt's shaving supplies, including small mirror used in the shower when completing activity at home, pt's actual razor, and his shaving cream. OT facilitated task-oriented training to address thoroughness when participating in task at home, particularly w/ L side of the face and his upper lip. Pt required extended time and mod verbal/tactile cues for sequencing. With extended time and visual feedback via mirror, pt was able to demonstrate good awareness of "missed spots," but then demonstrated  difficulty actually targeting those areas; most difficulty w/ areas right around his lips w/ pt perseverating on his chin and L cheek. Pt also only used R hand to spread shaving cream on his face and forgot to wipe remaining cream in his L hand when toweling off.  Washing hands after shaving w/ OT providing verbal cues for thoroughness; demonstrated good spontaneous use of LUE  BUE Coordination Using both hands to pull cones apart 1 at a time and stack by color w/ mod cueing/facilitation for incorporation of LUE and to correctly stack cones throughout. Increased success w/ only 2 colors vs incr challenge using 3.  Using both hands to connect and then pull apart various PVC parts; spontaneous use of LUE about 60% of the time w/ OT providing verbal/tactile cues for consistent bilateral coordination and for assist w/  problem-solving prn    PATIENT EDUCATION: Ongoing caregiver education, particularly related to cues and facilitation for increased pt participation Person educated: Patient and Spouse Education method: Explanation Education comprehension: verbalized understanding   GOALS: Goals reviewed with patient? Yes   SHORT TERM GOALS: Target date: 12/29/2021   STG   Status:  1 Pt/caregiver will verbalize understanding of visual perceptual compensatory strategies Baseline: Decreased knowledge of visual deficits and strategies Progressing  2 Pt will complete symbol cancellation task w/ at least 50% accuracy using visual compensatory strategies to indicate improved attention Baseline: 40% accuracy w/ multiple errors Met - 11/17/21  3 Pt will participate in bilateral coordination activity w/ no more than 3 cues for incorporation of LUE Baseline: L-sided inattention Met - 12/22/21: completed task w/ no cues for bil integration      LONG TERM GOALS: Target date: 01/13/2022   LTG   Status:  1 Pt will be able to don overhead shirt w/ SPV and verbal/gestural cues prn in at least 1 attempt Baseline: Min A to don/doff shirt sleeve on L side Progressing  2 Pt will be able to thread bottoms on BLEs or don socks w/ Mod A Baseline: Max A for LB dressing Progressing  3 Pt will demonstrate supine-to-sit transition w/ cues from caregiver and incorporation of AE/DME prn Baseline: Mod-Max A w/ bed mobility Met - 12/03/21; SPV w/ bed ladder and cues  4 Pt will be able to use TV remote (or adaptive remote) to complete 1 task (e.g., turn TV on/of, turn volume up/down) w/ SPV Baseline: Difficulty  Not Progressing - d/c due to continued difficulty and agitation w/ task at home  5 Pt will increase Barthel Index score by at least 5 to indicate improved level of participation in BADLs Baseline: 55/100 Progressing  6 Pt will be able to make a peanut butter and jelly sandwich w/ SPV and verbal/tactile cues prn Progressing      ASSESSMENT:   CLINICAL IMPRESSION: OT facilitated task-specific training of shaving activity, incorporating assistance prn for errorless learning, auditory/verbal/tactile cues, and immediate feedback and knowledge of results to facilitate incr success based on current EBP for NCD. Overall, pt able to complete activity w/ SPV for safety, requiring verbal cues, but currently requires Min A for thoroughness. Difficulties observed during difficulty suggest limitations w/ problem-solving and motor planning. Pt continues to demonstrate improvement in L-sided awareness and incorporation of LUE during bilateral tasks w/ pt requiring decreased cues to use LUE and w/ increased success attending to L side w/ verbal cues only vs multimodal cues.   PERFORMANCE DEFICITS in functional skills including ADLs, IADLs, coordination, dexterity, [place], [facility], mobility, balance, and  vision, cognitive skills including attention, memory, orientation, problem solving, safety awareness, sequencing, and temperament/personality, and psychosocial skills including environmental adaptation, interpersonal interactions, and routines and behaviors.    IMPAIRMENTS are limiting patient from ADLs, IADLs, rest and sleep, leisure, and social participation.    COMORBIDITIES may have co-morbidities that affects occupational performance. Patient will benefit from skilled OT to address above impairments and improve overall function.     PLAN: OT FREQUENCY: 2x/week   OT DURATION: 10 weeks   PLANNED INTERVENTIONS: self care/ADL training, therapeutic exercise, therapeutic activity, neuromuscular re-education, balance training, functional mobility training, aquatic therapy, splinting, moist heat, cryotherapy, patient/family education, cognitive remediation/compensation, visual/perceptual remediation/compensation, psychosocial skills training, energy conservation, and DME and/or AE instructions   RECOMMENDED OTHER SERVICES: Currently receiving PT  services at this location   CONSULTED AND AGREED WITH PLAN OF CARE: Patient and family member/caregiver   PLAN FOR NEXT SESSION: Preparatory exercise(s) for L-sided attention, proprioception and kinesthetic sense prior to practice of ADL tasks   Kathrine Cords, MSOT, OTR/L 12/28/21, 5:45 PM

## 2021-12-29 ENCOUNTER — Ambulatory Visit: Payer: PPO | Admitting: Podiatry

## 2021-12-29 DIAGNOSIS — M79676 Pain in unspecified toe(s): Secondary | ICD-10-CM | POA: Diagnosis not present

## 2021-12-29 DIAGNOSIS — Z7901 Long term (current) use of anticoagulants: Secondary | ICD-10-CM | POA: Diagnosis not present

## 2021-12-29 DIAGNOSIS — B351 Tinea unguium: Secondary | ICD-10-CM

## 2021-12-29 NOTE — Telephone Encounter (Signed)
Pt"s wife would like a call on her cell when results are ready. [phone]

## 2021-12-30 ENCOUNTER — Ambulatory Visit: Payer: PPO | Admitting: Physical Therapy

## 2021-12-30 ENCOUNTER — Encounter: Payer: Self-pay | Admitting: Physical Therapy

## 2021-12-30 DIAGNOSIS — R278 Other lack of coordination: Secondary | ICD-10-CM

## 2021-12-30 DIAGNOSIS — Z9181 History of falling: Secondary | ICD-10-CM

## 2021-12-30 DIAGNOSIS — R296 Repeated falls: Secondary | ICD-10-CM

## 2021-12-30 DIAGNOSIS — R41840 Attention and concentration deficit: Secondary | ICD-10-CM | POA: Diagnosis not present

## 2021-12-30 DIAGNOSIS — I69354 Hemiplegia and hemiparesis following cerebral infarction affecting left non-dominant side: Secondary | ICD-10-CM

## 2021-12-30 DIAGNOSIS — R2689 Other abnormalities of gait and mobility: Secondary | ICD-10-CM

## 2021-12-30 DIAGNOSIS — R262 Difficulty in walking, not elsewhere classified: Secondary | ICD-10-CM

## 2021-12-30 DIAGNOSIS — M6281 Muscle weakness (generalized): Secondary | ICD-10-CM

## 2021-12-30 NOTE — Progress Notes (Signed)
NEUROLOGY FOLLOW UP OFFICE NOTE  William [name] [id number]  Assessment/Plan:    1.   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   Major vascular neurocognitive disorder, progressed since recent stroke 3.  Intracranial stenosis 4.  Hypertension 5.  Hyperlipidemia 6.  History of right CEA 7.  Parkinsonism - unclear if vascular vs idiopathic   1.  Secondary stroke prevention -  Continue ASA and Plavix for remainder of 3 months, then Plavix '75mg'$  daily alone - Blood pressure control - Glycemic control.  Hgb A1c goal less than 7 -  LDL goal less than 70.  Unable to tolerate statin.   2.  Continue home health services.  If no significant improvement over next few months, consider palliative care to assist with management of his chronic comorbidities 4.  Encourage walking, mental stimulation and socialization 5. As he continues to be confused and has previously had trouble with recurrent UTIs, will check UA and culture.  6. Follow up 4 months.   Subjective:  William [name] is a 81 year old man with dementia, CKD, HTN, CAD, HLD and history of CVA who follows up for transition of care visit due to recent hospitalization for stroke.  He is accompanied by his wife who supplements history.  UPDATE: Current medications:  ASA '81mg'$ , Plavix '75mg'$ ; Norvasc, hydralazine, fenofibrate '160mg'$ , Tegretol-XR '100mg'$  at bedtime; amantadine, lorazepam PRN; tramadol '50mg'$  Q8h PRN  He is still doing PT and OT.  Released from speech therapy.  Still trouble with swallowing.  Cannot take pills.  Needs to be crushed or changed to liquid.  Sundowning still present but improved.  Not always as agitated.  Psychiatrist changed to lorazepam which helps.  Trouble concentration walks in morning.   Amantadine,    HISTORY: 1.  Lumbar spondylosis with radiculopathy Prior to COVID, he was going to the gym and seeing a personal trainer but it stopped once the pandemic hit.  Since the pandemic, he has been sedentary.   He walks the dog once or twice a week.  He started having bilateral leg pain later that year in 2020.  No associated back pain.  No shooting pain.  It is described as an aching in his calves.  No associated numbness.  He also reports weakness.  When he walks, he has trouble picking up his legs.  He seems to shuffle his feet.  When he gets up to stand, he feels like he can fall forward.  He had one fall while standing at the toilet and he bent forward to spit in toilet.  No change in bowel habit.  He was having urinary issues so he was taken off of Flomax.    Vascular US ABI from 12/08/18 showed abnormal toe-brachial index in both lower extremities but otherwise unremarkable.  MRI of cervical spine without contrast from 03/10/2019 showed some cervical degenerative disc disease and spondylosis with mild foraminal narrowing but no spinal stenosis or compressive myelopathy.  He does have history of low back pain and lumbar spondylosis.  Due to ongoing unsteady gait with bilateral leg pain, MRI of lumbar spine was performed on 11/14/2019, which was personally reviewed and demonstrated degenerative changes stable compared to 2012 with L3-4 right subarticular stenosis.  He has had mixed response to epidural injections.     2.  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  He was found to  have right ICA stenosis requiring right carotid endarterectomy.  He has some residual left sided weakness and facial weakness.  He underwent left carotid endarterectomy in 2010 for asymptomatic stenosis.   Beginning around 2013, he and his wife have noted a gradual progression and cognitive deficits. Onset correlated during a period when he was experiencing dizziness and syncope secondary to orthostatic hypotension. At that time, he was dehydrated and not drinking much fluids. He was instructed to start increasing his fluid intake  as well as his sodium intake. Now, he tries to drink 3 bottles of water per day. He particularly notes confusion regarding how to perform certain everyday tasks. For example, it takes him a long time to unload the dishwasher because he has trouble figuring out where to put the dishes. He use to enjoy cooking and preparing meals. Now he has difficulty cooking and can only see things up in the microwave. He also has been experiencing dressing apraxia. Sometimes he will put both his feet into one leg of his pants. Other times, he has difficulty buttoning his shirts. He denies any language dysfunction such as difficulty understanding other people or other people not understanding him. He has no trouble reading or writing. His short-term and long-term memory are pretty good. He denies any difficulty with face recognition. He denies hallucinations or delusions. He has not had any change in his personality or behavior. He still drives but very seldom and only locally. He has not had any problems with accidents or near accidents, but he says he drives slowly because he is very nervous and cautious. He does have history of anxiety and often shakes when he gets nervous. He denies any family history of dementia. However, there is a psychiatric history with his mother and sister.  He denies history of alcohol abuse or drug use.   He underwent neuropsychological testing on 10/25/13 with Dr. Macarthur Critchley at [facility] suggested mild dementia due to his stroke, but also underlying neurodegenerative process.  Memory and left hemispheric medial temporal functions were intact.  Findings showed deficits in visual processing and executive dysfunction.  Onset of visual-spatial deficits and apraxia may suggest occipital lobe involvement, such as due to an Alzheimer's variant called posterior cortical atrophy.  MRI from May 2015 revealed global atrophy but it does not seem more prominent in the occipital lobes.  He developed  increased confusion in February 2021.  At physical therapy, he was noted to have increased difficulty using his left side (affected side from his stroke).  He had difficulty performing finger-thumb tapping and had trouble gripping the handle of the exercise machine with his left hand.  He also has had acute cognitive changes.  He started having trouble dialing his daughter's phone number which is new.  MRI of brain without contrast performed on 08/15/2019 was personally reviewed and showed remote large right hemispheric stroke and chronic small vessel ischemic changes, stable compared to prior MRI from 2015, no acute intracranial abnormality.   He underwent repeat neuropsychological testing on 11/29/2019 which demonstrated findings primarily consistent with major vascular neurocognitive disorder, relatively stable compared to prior testing in 2015.  Underlying neurodegenerative disorder could not be ruled out but clinically has been stable.    In February 2023, he became more confused.  Found to have UTI as well as acute infarct in left basal ganglia and corona radiata as well as severe distal left M1 MCA stenosis and moderate right ICA stenosis.  Discharged on ASA and Plavix for  3 months followed by Plavix monotherapy.  Following discharge, continued to be confused.  Mistakes wife with his ex-wife.  Incontinent.  Baseline hemineglect worse.     Prior medication:  Aricept (diarrhea, vivid dreams); galantamine (nausea)   3.  Headache: In February 2022, he reported new moderate non-throbbing headache back of head every night.  Ongoing for two months.  No neck pain.  It occurs in the evening while sitting talking or watching TV.  Treats with Tylenol almost everyday.  Lasts a half hour.  No change in medication just prior to onset.  Similar headache when he had his stroke.  MRI of brain without contrast on 08/14/2020 showed no new acute/subacute intracranial abnormality.  Headaches resolved.     01/30/20 Carotid  doppler:  1-39% bilateral ICA stenosis. 10/16/13 LABS:  B12 720, TSH 1.668 10/19/13 MRI BRAIN WO:  stable remote large right MCA territory infarct.  Chronic microvascular ischemic changes. 05/21/13 LABS:  Hgb A1c 6 05/17/13 Carotid doppler: bilateral 1-39% stenosis of the right proximal ICA and no stenosis of the left ICA. 03/11/12 MRI Brain wo contrast:  Remote large right MCA infarct. Dementia with behavioral symptoms.  Etiology unclear.  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 [place]: 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   Mild, related to stroke history   Nocturia    Renal insufficiency 06/25/2013   S/P carotid endarterectomy    BILATERAL ICA--  PATENT PER DUPLEX  05-19-2012   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     amantadine (SYMMETREL) 100 MG capsule Take 1 capsule (100 mg total) by mouth daily. 30 capsule 0   amLODipine (NORVASC) 10 MG tablet  Take 1 tablet (10 mg total) by mouth every morning. 90 tablet 0   carBAMazepine (TEGRETOL) 100 MG/5ML suspension Take 5 mLs (100 mg total) by mouth 3 (three) times daily. 450 mL 12   Cholecalciferol (VITAMIN D) 50 MCG (2000 UT) CAPS Take 2,000 Units by mouth every morning.     clopidogrel (PLAVIX) 75 MG tablet Take 1 tablet (75 mg total) by mouth daily. 30 tablet 0   clotrimazole-betamethasone (LOTRISONE) cream Apply 1 Application on to the skin daily. 30 g 0   fluticasone (CUTIVATE) 0.26 % cream 1 application daily as needed (psoriasis).  3   hydrALAZINE (APRESOLINE) 25 MG tablet Take 1 tablet (25 mg total) by  mouth every 8 (eight) hours. 90 tablet 0   LORazepam (ATIVAN) 0.5 MG tablet Take 1/2 tablet (0.25 mg total) by mouth daily. 30 tablet 0   LORazepam (ATIVAN) 0.5 MG tablet Take 1/2 tablet by mouth every morning and take 1 tablet by mouth at bedtime 135 tablet 1   LORazepam (ATIVAN) 1 MG tablet Take 1 tablet (1 mg total) by mouth at bedtime. 30 tablet 1   LORazepam ER (LOREEV XR) 1 MG CS24 Take 1 mg (1 capsule)  by mouth at bedtime. 30 capsule 3   Multiple Vitamins-Minerals (ADULT ONE DAILY GUMMIES) CHEW Chew 1 tablet by mouth every morning.     pantoprazole (PROTONIX) 40 MG tablet Take 1 tablet (40 mg total) by mouth daily. 90 tablet 3   pantoprazole sodium (PROTONIX) 40 mg Place 40 mg into feeding tube daily. 30 packet 3   polyethylene glycol (MIRALAX / GLYCOLAX) 17 g packet Take 17 g by mouth daily. 14 each 0   tamsulosin (FLOMAX) 0.4 MG CAPS capsule Take 1 capsule (0.4 mg total) by mouth daily after supper. 90 capsule 0   traMADol (ULTRAM) 50 MG tablet Take 1 tablet (50 mg total) by mouth every 12 (twelve) hours as needed (back pain). 30 tablet    traZODone (DESYREL) 150 MG tablet Take 1/2 tablet (75 mg total) by mouth at bedtime. 30 tablet 0   triamcinolone cream (KENALOG) 0.1 % Apply 1 application topically 2 (two) times daily. (Patient taking differently: Apply 1 application  topically  2 (two) times daily as needed (psoriasis).) 30 g 0   Current Facility-Administered Medications on File Prior to Visit  Medication Dose Route Frequency Provider Last Rate Last Admin   gemcitabine (GEMZAR) chemo syringe for bladder instillation 2,000 mg  2,000 mg Bladder Instillation Once Franchot Gallo, MD        ALLERGIES: Allergies  Allergen Reactions   Bee Venom Anaphylaxis   Strawberry Extract Hives   Latex Itching   Adhesive [Tape] Other (See Comments)    BLISTER   Statins Other (See Comments)    myalgias    FAMILY HISTORY: Family History  Problem Relation Age of Onset   Heart disease Mother        CHF   Bipolar disorder Mother    Heart disease Father        CHF      Objective:  Blood pressure 126/67, pulse 72, resp. rate 20, height '5\' 11"'$  (1.803 m), weight 193 lb (87.5 kg), SpO2 96 %. General: No acute distress.  Patient appears well-groomed.   Head:  Normocephalic/atraumatic Eyes:  Fundi examined but not visualized Neck: supple, no paraspinal tenderness, full range of motion Heart:  Regular rate and rhythm Neurological Exam:     12/31/2021   12:00 PM 06/22/2016   11:19 AM  MMSE - Mini Mental State Exam  Orientation to time 3 5  Orientation to Place 5 5  Registration 3 3  Attention/ Calculation 2 5  Recall 2 3  Language- name 2 objects 1 2  Language- repeat 1 1  Language- follow 3 step command 3 3  Language- read & follow direction 1 1  Write a sentence 1 1  Copy design 0 0  Total score 22 29   left sided neglect/extinction to double simultaneous tactile stimulation.  Hypomimia.  Left lower homonymous quadrantanopia.  Otherwise, CN II-XII intact. Bulk and tone normal, muscle strength 3/5 left upper extremity, otherwise 4/5 throughout.  Resting tremor in hands.  Dyskinetic movements.  Sensation to light touch  intact.  Deep tendon reflexes 2+ throughout.  Finger to nose  testing intact.  Uses upper body to stand from chair.  Shuffling gait.    Metta Clines, DO  CC: Roma Schanz, DO

## 2021-12-30 NOTE — Therapy (Signed)
[facility] [place], [name], [address] Phone: [phone]   Fax:  [phone]  Physical Therapy Treatment  Patient Details  Name: William [name] MRN: [id number] Date of Birth: 12-27-1940 Referring Provider (PT): Supple   Encounter Date: 12/30/2021   PT End of Session - 12/30/21 1403     Visit Number 24    Date for PT Re-Evaluation 02/01/22    Authorization Type HTA    PT Start Time 5993    PT Stop Time 1442    PT Time Calculation (min) 45 min    Equipment Utilized During Treatment Gait belt    Activity Tolerance Patient tolerated treatment well    Behavior During Therapy [facility] for tasks assessed/performed;Flat affect;Impulsive             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   Mild, related to stroke history   Nocturia    Renal insufficiency 06/25/2013   S/P carotid endarterectomy    BILATERAL ICA--  PATENT PER DUPLEX  05-19-2012   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   Right calf   Urinary frequency    Vitamin D deficiency     Past Surgical History:  Procedure Laterality Date   APPENDECTOMY  AS CHILD   CARDIOVASCULAR STRESS TEST  03-27-2012  DR CRENSHAW   LOW RISK LEXISCAN STUDY-- PROBABLE NORMAL PERFUSION AND SOFT TISSUE ATTENUATION/  NO ISCHEMIA/ EF 51%   CAROTID  ENDARTERECTOMY Bilateral LEFT  11-12-2008  DR GREG HAYES   RIGHT ICA  2006  ([facility])   CYSTOSCOPY W/ RETROGRADES Bilateral 06/22/2021   Procedure: CYSTOSCOPY WITH RETROGRADE PYELOGRAM;  Surgeon: Franchot Gallo, MD;  Location[facility];  Service: Urology;  Laterality: Bilateral;   CYSTOSCOPY WITH LITHOLAPAXY N/A 02/26/2013   Procedure: CYSTOSCOPY WITH LITHOLAPAXY;  Surgeon: Franchot Gallo, MD;  Location: [facility];  Service: Urology;  Laterality: N/A;   EYE SURGERY  Jan. 2016   cataract surgery both eyes   INGUINAL HERNIA REPAIR Right 11-08-2006   IR KYPHO EA ADDL LEVEL THORACIC OR LUMBAR  02/12/2021   IR RADIOLOGIST EVAL & MGMT  02/18/2021   MASS EXCISION N/A 03/03/2016   Procedure: EXCISION OF BACK  MASS;  Surgeon: Stark Klein, MD;  Location: [facility];  Service: General;  Laterality: N/A;   MOHS SURGERY Left 1/ 2016   Dr Nevada Crane-- Basal cell   PROSTATE SURGERY     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-C N/A 06/22/2021   Procedure: TRANSURETHRAL RESECTION OF BLADDER TUMOR;  Surgeon: Franchot Gallo, MD;  Location: [facility];  Service: Urology;  Laterality: N/A;   TRANSURETHRAL RESECTION OF PROSTATE N/A 02/26/2013   Procedure: TRANSURETHRAL RESECTION OF THE PROSTATE WITH GYRUS INSTRUMENTS;  Surgeon: Franchot Gallo, MD;  Location: [facility];  Service: Urology;  Laterality: N/A;   TRANSURETHRAL RESECTION OF PROSTATE N/A 06/22/2021   Procedure: TRANSURETHRAL RESECTION OF THE PROSTATE (TURP);  Surgeon: Franchot Gallo, MD;  Location: [facility];  Service: Urology;  Laterality: N/A;    There were no vitals filed for this visit.   Subjective Assessment - 12/30/21 1403     Subjective No falls.  Wife reports that he did well today    Currently in Pain? No/denies                               [facility] Adult PT Treatment/Exercise - 12/30/21 0001        Transfers   Comments side scoots, fwd and backward scoots, seated on plinth      Ambulation/Gait   Gait Comments stairs step over step up and down, gait in the building 120' at a time with cues for step length      High Level Balance   High Level Balance Activities Side stepping    High Level Balance Comments ball kicks, beach ball taps, catches, high toe touches      Lumbar Exercises: Aerobic   Elliptical level 1 x 1 minute  15 seconds with Max A on and Max A x 2 off    Nustep levewl 5 x 5 minutes      Lumbar Exercises: Standing   Other Standing Lumbar Exercises hip abduction marches and extension 2# 2x10                       PT Short Term Goals - 12/14/21 1451       PT SHORT TERM GOAL #1   Title independent with initial HEP    Time 2    Period Weeks    Status On-going               PT Long Term Goals - 12/30/21 1610       PT LONG TERM GOAL #1   Title Understand fall risks, the importance of exercise and flexibility to health and function    Status Partially Met      PT LONG TERM GOAL #2   Title decrease TUG time to 13 seconds    Status Partially Met      PT LONG TERM GOAL #3   Title walk around our building without rest     Status On-going      PT LONG TERM GOAL #4   Title increase LE strength to 4+/5    Status On-going      PT LONG TERM GOAL #5   Title increase Berg blance test score to 46/56    Status On-going                   Plan - 12/30/21 1609     Clinical Impression Statement a lot of difficulty getting off the Elliptical, Max A of two people to get him off as he just sat down.  After that he did well much better scooting and walks well with cues for the step length             Patient will benefit from skilled therapeutic intervention in order to improve the following deficits and impairments:  Abnormal gait, Decreased coordination, Decreased range of motion, Difficulty walking, Increased muscle spasms,  Cardiopulmonary status limiting activity, Decreased endurance,  Decreased activity tolerance, Pain, Impaired flexibility, Decreased balance, Decreased mobility, Decreased strength, Postural dysfunction, Increased edema  Visit Diagnosis: Other lack of coordination  History of falling  Other abnormalities of gait and mobility  Hemiplegia and hemiparesis following cerebral infarction affecting left non-dominant side (HCC)  Difficulty in walking, not elsewhere classified  Muscle weakness (generalized)  Repeated falls     Problem List Patient Active Problem List   Diagnosis Date Noted   Acute pain of left shoulder 11/12/2021   Leukocytes in urine 11/12/2021   Urinary frequency 11/12/2021   Thrush 10/08/2021   Hemiplegia, dominant side S/P CVA (cerebrovascular accident) ([place]) 09/11/2021   Insomnia    Prediabetes    Chronic kidney disease (CKD), stage IV (severe) ([place])    Basal ganglia infarction ([place]) 07/29/2021   Transaminitis 07/27/2021   UTI (urinary tract infection) 07/27/2021   CVA (cerebral vascular accident) ([place]) 07/27/2021   Fall at home, initial encounter 07/27/2021   Hyperglycemia 07/27/2021   CKD (chronic kidney disease), stage IV ([place]) 07/27/2021   Cholelithiasis 07/27/2021   Hypoxia 07/27/2021   Nausea and vomiting 95/62/1308   Acute metabolic encephalopathy 65/78/4696   Normocytic anemia 07/27/2021   Chronic back pain 07/27/2021   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[place]) 06/22/2021   Closed fracture of first lumbar vertebra with routine healing 02/03/2021   Closed fracture of multiple ribs 11/18/2020   Anxiety 01/29/2020   Leg pain, bilateral 01/29/2020   Ingrown toenail 07/13/2019   Lumbar spondylosis 05/02/2018   Pain in left knee 03/09/2018   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   Trochanteric bursitis of left hip 01/16/2018   Preventative health care 09/26/2017   HTN (hypertension) 07/19/2015   Hyperlipidemia 07/19/2015   Great toe  pain 02/11/2014   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   Obesity (BMI 30-39.9) 06/25/2013   Renal insufficiency 06/25/2013   Weakness of left arm 06/25/2013   Sebaceous cyst 03/03/2011   Sprain of lumbar region 07/31/2010   Rib pain, left 08/29/2009   Carotid artery stenosis, asymptomatic, bilateral 05/02/2009   Eczema, atopic 05/31/2008   Vitamin D deficiency 03/01/2008   BPH (benign prostatic hyperplasia) 08/06/2007   Fasting hyperglycemia 12/21/2006   History of right MCA infarct 2006    , W, PT 12/30/2021, 4:11 PM  [facility] [place], [name], [address] Phone: [phone]   Fax:  [phone]  Name: William [name] MRN: [id number] Date of Birth: [birth year]

## 2021-12-31 ENCOUNTER — Ambulatory Visit: Payer: PPO | Admitting: Neurology

## 2021-12-31 ENCOUNTER — Encounter: Payer: Self-pay | Admitting: Neurology

## 2021-12-31 ENCOUNTER — Ambulatory Visit: Payer: PPO | Admitting: Occupational Therapy

## 2021-12-31 ENCOUNTER — Encounter: Payer: Self-pay | Admitting: Occupational Therapy

## 2021-12-31 VITALS — BP 126/67 | HR 72 | Resp 20 | Ht 71.0 in | Wt 193.0 lb

## 2021-12-31 DIAGNOSIS — I69318 Other symptoms and signs involving cognitive functions following cerebral infarction: Secondary | ICD-10-CM

## 2021-12-31 DIAGNOSIS — Z9181 History of falling: Secondary | ICD-10-CM

## 2021-12-31 DIAGNOSIS — I639 Cerebral infarction, unspecified: Secondary | ICD-10-CM

## 2021-12-31 DIAGNOSIS — G20 Parkinson's disease: Secondary | ICD-10-CM | POA: Diagnosis not present

## 2021-12-31 DIAGNOSIS — R41840 Attention and concentration deficit: Secondary | ICD-10-CM | POA: Diagnosis not present

## 2021-12-31 DIAGNOSIS — R2689 Other abnormalities of gait and mobility: Secondary | ICD-10-CM

## 2021-12-31 DIAGNOSIS — R278 Other lack of coordination: Secondary | ICD-10-CM

## 2021-12-31 DIAGNOSIS — F0390 Unspecified dementia without behavioral disturbance: Secondary | ICD-10-CM | POA: Diagnosis not present

## 2021-12-31 NOTE — Patient Instructions (Signed)
Continue physical therapy and occupational therapy Follow up in 6 months.

## 2021-12-31 NOTE — Therapy (Signed)
OUTPATIENT OCCUPATIONAL THERAPY TREATMENT & PROGRESS NOTE   Patient Name: William [name] MRN: [id number] DOB:1940-10-21, 81 y.o., male Today's Date: 12/31/2021  PCP: Roma Schanz, DO REFERRING PROVIDER: Carollee Herter, Alferd Apa, DO   OT End of Session - 12/31/21 1539     Visit Number 20    Number of Visits 22    Date for OT Re-Evaluation 01/13/22    Authorization Type [employer]    Authorization Time Period VL: [place]    Progress Note Due on Visit 20    OT Start Time 1530    OT Stop Time 1615    OT Time Calculation (min) 45 min    Activity Tolerance Patient tolerated treatment well    Behavior During Therapy Flat affect;Restless            Occupational Therapy Progress Note   Dates of Reporting Period: 12/01/21 to 12/31/21   Objective Reports of Subjective Statement: Pt independently demonstrated use of previously discussed visual compensatory strategy   Objective Measurements: Barthel Index, symbol cancellation, levels of assistance during functional activities   Goal Update: Pt has met 3/3 STGs and 1/6 LTGs; progressing well toward remaining LTGs   Plan: Continue to address coordination, balance, GM/FM control, cognition, safety awareness, compensatory strategies/AE prn, inattention and visual-perception   Reason Skilled Services are Required: Deficits in executive functioning, functional use of LUE, imbalance, as well as weakness and decreased coordination continue to be significantly limiting to participation and safety in BADLs, IADLs, and functional mobility, which also contributes to caregiver strain   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   Mohs surgery jan 2016    Bladder stone    BPH (benign prostatic hyperplasia) 08/06/2007   Carotid artery occlusion    Chronic kidney disease 2014   Stage III   Closed fracture of fifth metacarpal bone 05/15/2015   Eczema    Fasting  hyperglycemia 12/21/2006   GERD (gastroesophageal reflux disease)    History of carotid artery stenosis    S/P BILATERAL CEA   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   HTN (hypertension) 07/19/2015   Hyperlipidemia    Hypertension    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   Mild, related to stroke history   Nocturia    Renal insufficiency 06/25/2013   S/P carotid endarterectomy    BILATERAL ICA--  PATENT PER DUPLEX  05-19-2012   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   Right calf   Urinary frequency    Vitamin D deficiency    Past Surgical History:  Procedure Laterality Date   APPENDECTOMY  AS CHILD   CARDIOVASCULAR STRESS TEST  03-27-2012  DR CRENSHAW   LOW RISK LEXISCAN STUDY-- PROBABLE NORMAL PERFUSION AND SOFT TISSUE ATTENUATION/  NO ISCHEMIA/ EF 51%   CAROTID ENDARTERECTOMY Bilateral LEFT  11-12-2008  DR GREG HAYES   RIGHT ICA  2006  ([facility])   CYSTOSCOPY W/ RETROGRADES Bilateral 06/22/2021   Procedure: CYSTOSCOPY WITH RETROGRADE PYELOGRAM;  Surgeon: Franchot Gallo, MD;  Location: [facility];  Service: Urology;  Laterality: Bilateral;   CYSTOSCOPY WITH LITHOLAPAXY N/A 02/26/2013   Procedure: CYSTOSCOPY WITH LITHOLAPAXY;  Surgeon: Franchot Gallo, MD;  Location: [facility];  Service: Urology;  Laterality: N/A;   EYE SURGERY  Jan. 2016   cataract surgery both eyes   INGUINAL HERNIA REPAIR Right 11-08-2006   IR  KYPHO EA ADDL LEVEL THORACIC OR LUMBAR  02/12/2021   IR RADIOLOGIST EVAL & MGMT  02/18/2021   MASS EXCISION N/A 03/03/2016   Procedure: EXCISION OF BACK  MASS;  Surgeon: Stark Klein, MD;  Location: [place];  Service: General;  Laterality: N/A;   MOHS SURGERY Left 1/ 2016   Dr Nevada Crane-- Basal cell   PROSTATE SURGERY     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-C N/A 06/22/2021   Procedure: TRANSURETHRAL RESECTION OF BLADDER TUMOR;  Surgeon: Franchot Gallo, MD;   Location: [facility];  Service: Urology;  Laterality: N/A;   TRANSURETHRAL RESECTION OF PROSTATE N/A 02/26/2013   Procedure: TRANSURETHRAL RESECTION OF THE PROSTATE WITH GYRUS INSTRUMENTS;  Surgeon: Franchot Gallo, MD;  Location: [facility];  Service: Urology;  Laterality: N/A;   TRANSURETHRAL RESECTION OF PROSTATE N/A 06/22/2021   Procedure: TRANSURETHRAL RESECTION OF THE PROSTATE (TURP);  Surgeon: Franchot Gallo, MD;  Location: [facility];  Service: Urology;  Laterality: N/A;   Patient Active Problem List   Diagnosis Date Noted   Acute pain of left shoulder 11/12/2021   Leukocytes in urine 11/12/2021   Urinary frequency 11/12/2021   Thrush 10/08/2021   Hemiplegia, dominant side S/P CVA (cerebrovascular accident) ([place]) 09/11/2021   Insomnia    Prediabetes    Chronic kidney disease (CKD), stage IV (severe) ([place])    Basal ganglia infarction ([place]) 07/29/2021   Transaminitis 07/27/2021   UTI (urinary tract infection) 07/27/2021   CVA (cerebral vascular accident) ([place]) 07/27/2021   Fall at home, initial encounter 07/27/2021   Hyperglycemia 07/27/2021   CKD (chronic kidney disease), stage IV ([place]) 07/27/2021   Cholelithiasis 07/27/2021   Hypoxia 07/27/2021   Nausea and vomiting 08/67/6195   Acute metabolic encephalopathy 09/32/6712   Normocytic anemia 07/27/2021   Chronic back pain 07/27/2021   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[place]) 06/22/2021   Closed fracture of first lumbar vertebra with routine healing 02/03/2021   Closed fracture of multiple ribs 11/18/2020   Anxiety 01/29/2020   Leg pain, bilateral 01/29/2020   Ingrown toenail 07/13/2019   Lumbar spondylosis 05/02/2018   Pain in left knee 03/09/2018   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   Trochanteric bursitis of left hip 01/16/2018   Preventative health care 09/26/2017   HTN (hypertension) 07/19/2015   Hyperlipidemia 07/19/2015   Great toe pain  02/11/2014   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   Obesity (BMI 30-39.9) 06/25/2013   Renal insufficiency 06/25/2013   Weakness of left arm 06/25/2013   Sebaceous cyst 03/03/2011   Sprain of lumbar region 07/31/2010   Rib pain, left 08/29/2009   Carotid artery stenosis, asymptomatic, bilateral 05/02/2009   Eczema, atopic 05/31/2008   Vitamin D deficiency 03/01/2008   BPH (benign prostatic hyperplasia) 08/06/2007   Fasting hyperglycemia 12/21/2006   History of right MCA infarct 2006    ONSET DATE: 07/27/21  REFERRING DIAG: R41.4 (ICD-10-CM) - Hemi-inattention I63.9 (ICD-10-CM) - Cerebrovascular accident (CVA), unspecified mechanism ([place])   THERAPY DIAG:  Attention and concentration deficit  Other lack of coordination  History of falling  Other abnormalities of gait and mobility  Other symptoms and signs involving cognitive functions following cerebral infarction  PERTINENT HISTORY: L basal ganglia and corona radiata infarct 07/27/21 (had documented AMS for about 5 days prior w/ hospital work-up including MRI negative and symptoms attributed to a UTI); L lower homonymous quadrantanopia. Other PMH includes R MCA infarct in 2006 w/ L-sided hemiparesis, NCD,  carotid artery disease (bilateral), HTN, HLD,CKD3, and hx of bladder cancer  PRECAUTIONS: Fall risk  SUBJECTIVE:   SUBJECTIVE ASSESSMENT: Pt's spouse independently identified previously discussed visual compensatory strategies within functional context and verbalized understanding of additional strategies discussed this session  PAIN: Are you having pain? No   OBJECTIVE:   TODAY'S TREATMENT - 12/31/21:  Meal Prep Simple meal prep activity with focus on dynamic balance, functional mobility, LUE functional use, alternating attention, executive function, and safety awareness. Completed w/ 3-step recipe involving 6 ingredients w/ overall Min A. OT provided gestural and context cues, incorporation of  previously discussed compensatory strategies, breakdown of task and coaching, extended time for processing, and grading within task to facilitate success.  While at ambulatory level with use of varying services including table top and countertop level, pt completed the following to address meal prep requirements: following simple recipe, opening various containers/packages, pouring/leveling ingredients w/ measuring cups, using sink, operating microwave, and use of refrigerator/cabinets. Pt also able to verbalize appropriate safety considerations w/ verbal cues when attempting simple meal/snack prep at home.   The following facilitation needed for overall success and safety awareness: physical assist to hold measuring cup while pouring ingredients, verbal cues and occ HOHA for thoroughness when mixing/stirring, use of pot holder when retrieving hot dish from microwave, and SPV for setting/use of microwave    PATIENT EDUCATION: Ongoing caregiver education, particularly related to cues and facilitation for increased pt participation; reviewed visual perceptual compensatory strategies Person educated: Patient and Spouse Education method: Explanation Education comprehension: verbalized understanding   GOALS: Goals reviewed with patient? Yes   SHORT TERM GOALS: Target date: 12/29/2021   STG   Status:  1 Pt/caregiver will verbalize understanding of visual perceptual compensatory strategies Baseline: Decreased knowledge of visual deficits and strategies Met - 12/31/21  2 Pt will complete symbol cancellation task w/ at least 50% accuracy using visual compensatory strategies to indicate improved attention Baseline: 40% accuracy w/ multiple errors Met - 11/17/21  3 Pt will participate in bilateral coordination activity w/ no more than 3 cues for incorporation of LUE Baseline: L-sided inattention Met - 12/22/21: completed task w/ no cues for bil integration      LONG TERM GOALS: Target date: 01/13/2022   LTG    Status:  1 Pt will be able to don overhead shirt w/ SPV and verbal/gestural cues prn in at least 1 attempt Baseline: Min A to don/doff shirt sleeve on L side Progressing  2 Pt will be able to thread bottoms on BLEs or don socks w/ Mod A Baseline: Max A for LB dressing Progressing  3 Pt will demonstrate supine-to-sit transition w/ cues from caregiver and incorporation of AE/DME prn Baseline: Mod-Max A w/ bed mobility Met - 12/03/21; SPV w/ bed ladder and cues  4 Pt will be able to use TV remote (or adaptive remote) to complete 1 task (e.g., turn TV on/of, turn volume up/down) w/ SPV Baseline: Difficulty  Not Progressing - d/c due to continued difficulty and agitation w/ task at home  5 Pt will increase Barthel Index score by at least 5 to indicate improved level of participation in BADLs Baseline: 55/100 Progressing  6 Pt will be able to make a peanut butter and jelly sandwich w/ SPV and verbal/tactile cues prn Progressing     ASSESSMENT:   CLINICAL IMPRESSION: OT facilitated task-specific training of meal prep activity following simple provided recipe, incorporating assist for errorless learning prn, auditory/verbal/tactile cues, and immediate feedback and knowledge of results   throughout based on current EBP for NCD. Overall, pt able to complete activity w/ Min A for safety and sequencing, but demonstrated significantly improved alternating attention and working memory compared to prior sessions. Difficulties observed and assistance required suggest continued limitations w/ problem-solving and safety awareness. Pt continues to demonstrate improvement in L-sided awareness and incorporation of LUE during bilateral tasks at non-dominant level. Pt will continue to benefit from skilled OT services to build on these improvements and progress toward remaining functional goals.   PERFORMANCE DEFICITS in functional skills including ADLs, IADLs, coordination, dexterity, [place], GMC, mobility, balance, and  vision, cognitive skills including attention, memory, orientation, problem solving, safety awareness, sequencing, and temperament/personality, and psychosocial skills including environmental adaptation, interpersonal interactions, and routines and behaviors.    IMPAIRMENTS are limiting patient from ADLs, IADLs, rest and sleep, leisure, and social participation.    COMORBIDITIES may have co-morbidities that affects occupational performance. Patient will benefit from skilled OT to address above impairments and improve overall function.     PLAN: OT FREQUENCY: 2x/week   OT DURATION: 10 weeks   PLANNED INTERVENTIONS: self care/ADL training, therapeutic exercise, therapeutic activity, neuromuscular re-education, balance training, functional mobility training, aquatic therapy, splinting, moist heat, cryotherapy, patient/family education, cognitive remediation/compensation, visual/perceptual remediation/compensation, psychosocial skills training, energy conservation, and DME and/or AE instructions   RECOMMENDED OTHER SERVICES: Currently receiving PT services at this location   CONSULTED AND AGREED WITH PLAN OF CARE: Patient and family member/caregiver   PLAN FOR NEXT SESSION: Preparatory exercise(s) for L-sided attention, proprioception and kinesthetic sense prior to practice of ADL tasks; continue to address bilateral coordination and attention to task   Kathrine Cords, MSOT, OTR/L 12/31/21, 5:45 PM

## 2022-01-01 NOTE — Telephone Encounter (Signed)
Wife viewed labs via [place]

## 2022-01-01 NOTE — Progress Notes (Signed)
Subjective: 81 y.o. returns the office today for painful, elongated, thickened toenails which he cannot trim himself.  Ingrown toenails been doing well.  Denies any drainage or pus from the sternal sites.  No open lesions.  No other concerns today.  He is on Plavix.  PCP: Ann Held, DO Last seen 12/10/2021  Objective: NAD, presents with his wife DP/PT pulses palpable, CRT less than 3 seconds  Nails hypertrophic, dystrophic, elongated, brittle, discolored x 10.  Incurvation of the left hallux toenail without any edema, erythema or signs of infection.  There is tenderness overlying the nails 1-5 bilaterally. There is no edema, erythema or drainage to the toenail sites today. Hammertoes present No open lesions or pre-ulcerative lesions are identified. No pain with calf compression, swelling, warmth, erythema.  Assessment: Patient presents with symptomatic onychomycosis, ingrown toenail  Plan: -Treatment options including alternatives, risks, complications were discussed -Nails debrided x 10 without any complications or bleeding.  Ingrown toenails been doing well at this time.  Monitor for any signs or symptoms of infection or pain associated with this.  -Continue daily dressing changes for the abrasion.  Monitoring signs or symptoms of infection.  Return in 9 weeks or sooner if needed  Celesta Gentile, DPM

## 2022-01-04 ENCOUNTER — Other Ambulatory Visit: Payer: Self-pay | Admitting: Family Medicine

## 2022-01-04 ENCOUNTER — Other Ambulatory Visit (HOSPITAL_BASED_OUTPATIENT_CLINIC_OR_DEPARTMENT_OTHER): Payer: Self-pay

## 2022-01-04 ENCOUNTER — Encounter: Payer: Self-pay | Admitting: Physical Medicine and Rehabilitation

## 2022-01-04 ENCOUNTER — Encounter: Payer: PPO | Attending: Physical Medicine and Rehabilitation | Admitting: Physical Medicine and Rehabilitation

## 2022-01-04 VITALS — BP 145/77 | HR 71 | Ht 71.0 in | Wt 193.2 lb

## 2022-01-04 DIAGNOSIS — K055 Other periodontal diseases: Secondary | ICD-10-CM | POA: Diagnosis not present

## 2022-01-04 DIAGNOSIS — F05 Delirium due to known physiological condition: Secondary | ICD-10-CM | POA: Insufficient documentation

## 2022-01-04 DIAGNOSIS — L231 Allergic contact dermatitis due to adhesives: Secondary | ICD-10-CM

## 2022-01-04 DIAGNOSIS — S46012S Strain of muscle(s) and tendon(s) of the rotator cuff of left shoulder, sequela: Secondary | ICD-10-CM | POA: Diagnosis not present

## 2022-01-04 DIAGNOSIS — I69359 Hemiplegia and hemiparesis following cerebral infarction affecting unspecified side: Secondary | ICD-10-CM | POA: Diagnosis not present

## 2022-01-04 MED ORDER — AMANTADINE HCL 100 MG PO CAPS
100.0000 mg | ORAL_CAPSULE | Freq: Every day | ORAL | 1 refills | Status: DC
  Filled 2022-01-04 – 2022-01-06 (×2): qty 90, 90d supply, fill #0
  Filled 2022-06-20: qty 90, 90d supply, fill #1

## 2022-01-04 MED ORDER — HYDRALAZINE HCL 25 MG PO TABS
25.0000 mg | ORAL_TABLET | Freq: Three times a day (TID) | ORAL | 1 refills | Status: DC
  Filled 2022-01-04 – 2022-01-06 (×2): qty 270, 90d supply, fill #0
  Filled 2022-06-13 – 2022-06-15 (×2): qty 270, 90d supply, fill #1

## 2022-01-04 MED ORDER — TRIAMCINOLONE ACETONIDE 0.1 % EX CREA
1.0000 | TOPICAL_CREAM | Freq: Two times a day (BID) | CUTANEOUS | 0 refills | Status: DC
  Filled 2022-01-04: qty 30, 15d supply, fill #0

## 2022-01-04 MED ORDER — CLOPIDOGREL BISULFATE 75 MG PO TABS
75.0000 mg | ORAL_TABLET | Freq: Every day | ORAL | 1 refills | Status: DC
  Filled 2022-01-04: qty 90, 90d supply, fill #0
  Filled 2022-06-13 – 2022-06-15 (×2): qty 90, 90d supply, fill #1

## 2022-01-04 NOTE — Progress Notes (Signed)
Subjective:    Patient ID: William [name], male    DOB: Oct 15, 1940, 81 y.o.   MRN: [id number]  HPI William [name] is a 81 year old man who presents for hospital follow-up after CIR admission for CVA.  1) Sundowning -worsened with seroquel -patches negatively affected him -some nights he is great and other nights he is crazy.  -she feels like it may be worse when he gets sugar  2) Impaired mobility: -He is walking 0.3 miles per day. -he continues to go to PT  3) Left arm weakness: -received exercises and sometimes he does them and sometimes he doesn't do exercise  Pain Inventory Average Pain 0 Pain Right Now 0   BOWEL Number of stools per week: 7 Oral laxative use Yes  Type of laxative Miralax Incontinent  BLADDER Normal  Mobility walk with assistance how many minutes can you walk? 5-10 do you drive?  no  Function retired I need assistance with the following:  feeding, dressing, bathing, toileting, meal prep, household duties, and shopping  Neuro/Psych bladder control problems bowel control problems weakness numbness tremor trouble walking confusion anxiety  Prior Studies Any changes since last visit?  no  Physicians involved in your care Any changes since last visit?  no   Family History  Problem Relation Age of Onset   Heart disease Mother        CHF   Bipolar disorder Mother    Heart disease Father        CHF   Social History   Socioeconomic History   Marital status: Married    Spouse name: Not on file   Number of children: 2   Years of education: 12   Highest education level: High school graduate  Occupational History    Employer: Retired  Tobacco Use   Smoking status: Former    Packs/day: 2.00    Years: 40.00    Total pack years: 80.00    Types: Cigarettes    Quit date: 02/15/2005    Years since quitting: 16.8   Smokeless tobacco: Never  Vaping Use   Vaping Use: Never used  Substance and Sexual Activity   Alcohol use: Yes     Alcohol/week: 0.0 standard drinks of alcohol    Comment: Occasional   Drug use: No   Sexual activity: Yes    Partners: Female  Other Topics Concern   Not on file  Social History Narrative   Exercise--  Walks dogs everyday      LIves with wife , no stairs in home, caffeine - one cup coffee day, exercise - not much, Right handed, 12th grade, retired      One story home   Social Determinants of [place] Strain: Not on file  Food Insecurity: Not on file  Transportation Needs: Not on file  Physical Activity: Not on file  Stress: Not on file  Social Connections: Not on file   Past Surgical History:  Procedure Laterality Date   APPENDECTOMY  AS CHILD   CARDIOVASCULAR STRESS TEST  03-27-2012  DR [place] STUDY-- PROBABLE NORMAL PERFUSION AND SOFT TISSUE ATTENUATION/  NO ISCHEMIA/ EF 51%   CAROTID ENDARTERECTOMY Bilateral LEFT  11-12-2008  DR GREG HAYES   RIGHT ICA  2006  ([facility])   CYSTOSCOPY W/ RETROGRADES Bilateral 06/22/2021   Procedure: CYSTOSCOPY WITH RETROGRADE PYELOGRAM;  Surgeon: Franchot Gallo, MD;  Location: [place];  Service: Urology;  Laterality: Bilateral;  CYSTOSCOPY WITH LITHOLAPAXY N/A 02/26/2013   Procedure: CYSTOSCOPY WITH LITHOLAPAXY;  Surgeon: Franchot Gallo, MD;  Location: [facility];  Service: Urology;  Laterality: N/A;   EYE SURGERY  Jan. 2016   cataract surgery both eyes   INGUINAL HERNIA REPAIR Right 11-08-2006   IR KYPHO EA ADDL LEVEL THORACIC OR LUMBAR  02/12/2021   IR RADIOLOGIST EVAL & MGMT  02/18/2021   MASS EXCISION N/A 03/03/2016   Procedure: EXCISION OF BACK  MASS;  Surgeon: Stark Klein, MD;  Location: [place];  Service: General;  Laterality: N/A;   MOHS SURGERY Left 1/ 2016   Dr Nevada Crane-- Basal cell   PROSTATE SURGERY     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-C N/A 06/22/2021   Procedure: TRANSURETHRAL RESECTION OF BLADDER TUMOR;  Surgeon:  Franchot Gallo, MD;  Location: [facility];  Service: Urology;  Laterality: N/A;   TRANSURETHRAL RESECTION OF PROSTATE N/A 02/26/2013   Procedure: TRANSURETHRAL RESECTION OF THE PROSTATE WITH GYRUS INSTRUMENTS;  Surgeon: Franchot Gallo, MD;  Location: [facility];  Service: Urology;  Laterality: N/A;   TRANSURETHRAL RESECTION OF PROSTATE N/A 06/22/2021   Procedure: TRANSURETHRAL RESECTION OF THE PROSTATE (TURP);  Surgeon: Franchot Gallo, MD;  Location: [facility];  Service: Urology;  Laterality: N/A;   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   Mild, related to stroke history   Nocturia    Renal insufficiency 06/25/2013   S/P carotid endarterectomy    BILATERAL ICA--  PATENT PER DUPLEX  05-19-2012   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   Right calf   Urinary frequency    Vitamin D deficiency    BP (!) 145/77   Pulse 71   Ht '5\' 11"'$  (1.803 m)   Wt 193 lb 3.2 oz (87.6 kg)   SpO2 99%   BMI 26.95 kg/m   Opioid Risk Score:   Fall Risk Score:  `1  Depression screen [facility] 2/9     01/04/2022    9:32 AM 12/10/2021    9:07 AM 11/18/2020    1:57 PM 07/31/2020    9:06 AM 08/23/2017    1:59 PM 06/22/2016   11:19 AM 07/17/2015    9:04 AM  Depression screen PHQ 2/9  Decreased Interest 3 0 1 0 0 0 0  Down, Depressed, Hopeless 1  0 0 0 0 0 0  PHQ - 2 Score 4 0 1 0 0 0 0  Altered sleeping 0        Tired, decreased energy 2        Change in appetite 1        Feeling bad or failure about yourself  1        Trouble concentrating 3  Moving slowly or fidgety/restless 0        Suicidal thoughts 0        PHQ-9 Score 11        Difficult doing work/chores Somewhat difficult           Review of Systems  HENT: Negative.    Eyes: Negative.   Respiratory: Negative.    Cardiovascular: Negative.   Gastrointestinal:        Bowel control  Endocrine: Negative.   Genitourinary:        Bladder control  Musculoskeletal:  Positive for gait problem.  Skin: Negative.   Allergic/Immunologic: Negative.   Neurological:  Positive for tremors and weakness.  Hematological:  Bruises/bleeds easily.       Plavix  Psychiatric/Behavioral:  Positive for confusion. The patient is nervous/anxious.   All other systems reviewed and are negative.      Objective:   Physical Exam Gen: no distress, normal appearing HEENT: oral mucosa pink and moist, NCAT Cardio: Reg rate Chest: normal effort, normal rate of breathing Abd: soft, non-distended Ext: no edema Psych: pleasant, normal affect Skin: intact Neuro: Alert and oriented, LUE weakness    Assessment & Plan:  1) CVA -continue therapies -would benefit from handicap placard to increase mobility in the community -reviewed all medications and provided necessary refills. -discussed vagal nerve stimulation if strength fails to improve in 6 months.  -discussed that GP feels comfortable prescribing all medications.   2) Sundowning -encouraged coconut oil -encouraged low added sugar in diet  3) Left rotator cuff tear -try blue emu oil, CBD oil -Discussed current symptoms of pain and history of pain.  -Discussed benefits of exercise in reducing pain. -Discussed following foods that may reduce pain: 1) Ginger (especially studied for arthritis)- reduce leukotriene production to  decrease inflammation 2) Blueberries- high in phytonutrients that decrease inflammation 3) Salmon- marine omega-3s reduce joint swelling and pain 4) Pumpkin seeds- reduce inflammation 5) dark chocolate- reduces inflammation 6) turmeric- reduces inflammation 7) tart cherries - reduce pain and stiffness 8) extra virgin olive oil - its compound olecanthal helps to block prostaglandins  9) chili peppers- can be eaten or applied topically via capsaicin 10) mint- helpful for headache, muscle aches, joint pain, and itching 11) garlic- reduces inflammation  Link to further information on diet for chronic pain: [email or web address]   4) Pocketing -discussed giving smaller portions at a time.   5) Tremor  -resolved

## 2022-01-04 NOTE — Patient Instructions (Signed)
Blue emu oil CBD oil  Foods that may reduce pain: 1) Ginger (especially studied for arthritis)- reduce leukotriene production to decrease inflammation 2) Blueberries- high in phytonutrients that decrease inflammation 3) Salmon- marine omega-3s reduce joint swelling and pain 4) Pumpkin seeds- reduce inflammation 5) dark chocolate- reduces inflammation 6) turmeric- reduces inflammation 7) tart cherries - reduce pain and stiffness 8) extra virgin olive oil - its compound olecanthal helps to block prostaglandins  9) chili peppers- can be eaten or applied topically via capsaicin 10) mint- helpful for headache, muscle aches, joint pain, and itching 11) garlic- reduces inflammation  Link to further information on diet for chronic pain: [email or web address]

## 2022-01-04 NOTE — Therapy (Signed)
[facility]. [place], [name], [address] Phone: [phone]   Fax:  [phone]  Physical Therapy Treatment  Patient Details  Name: William [name] MRN: [id number] Date of Birth: 26-Aug-1940 Referring Provider (PT): Supple   Encounter Date: 01/05/2022     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   Mild, related to stroke history   Nocturia    Renal insufficiency 06/25/2013   S/P carotid endarterectomy    BILATERAL ICA--  PATENT PER DUPLEX  05-19-2012   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   Right calf   Urinary frequency    Vitamin D deficiency     Past Surgical History:  Procedure Laterality Date   APPENDECTOMY  AS CHILD   CARDIOVASCULAR STRESS TEST  03-27-2012  DR CRENSHAW   LOW RISK LEXISCAN STUDY-- PROBABLE NORMAL PERFUSION AND SOFT TISSUE ATTENUATION/  NO ISCHEMIA/ EF 51%   CAROTID ENDARTERECTOMY Bilateral LEFT  11-12-2008  DR GREG HAYES   RIGHT ICA  2006  ([facility])   CYSTOSCOPY W/ RETROGRADES Bilateral 06/22/2021   Procedure: CYSTOSCOPY WITH RETROGRADE PYELOGRAM;  Surgeon: Franchot Gallo, MD;  Location: [facility];  Service: Urology;  Laterality: Bilateral;   CYSTOSCOPY WITH LITHOLAPAXY N/A 02/26/2013   Procedure: CYSTOSCOPY WITH LITHOLAPAXY;  Surgeon: Franchot Gallo,  MD;  Location: [facility];  Service: Urology;  Laterality: N/A;   EYE SURGERY  Jan. 2016   cataract surgery both eyes   INGUINAL HERNIA REPAIR Right 11-08-2006   IR KYPHO EA ADDL LEVEL THORACIC OR LUMBAR  02/12/2021   IR RADIOLOGIST EVAL & MGMT  02/18/2021   MASS EXCISION N/A 03/03/2016   Procedure: EXCISION OF BACK  MASS;  Surgeon: Stark Klein, MD;  Location: [facility];  Service: General;  Laterality: N/A;   MOHS SURGERY Left 1/ 2016   Dr Nevada Crane-- Basal cell   PROSTATE SURGERY     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-C N/A 06/22/2021   Procedure: TRANSURETHRAL RESECTION OF BLADDER TUMOR;  Surgeon: Franchot Gallo, MD;  Location: [facility];  Service: Urology;  Laterality: N/A;   TRANSURETHRAL RESECTION OF PROSTATE N/A 02/26/2013   Procedure: TRANSURETHRAL RESECTION OF THE PROSTATE WITH GYRUS INSTRUMENTS;  Surgeon: Franchot Gallo, MD;  Location: [facility];  Service: Urology;  Laterality: N/A;   TRANSURETHRAL RESECTION OF PROSTATE N/A 06/22/2021   Procedure: TRANSURETHRAL RESECTION OF THE PROSTATE (TURP);  Surgeon: Franchot Gallo, MD;  Location: [facility];  Service: Urology;  Laterality: N/A;    There were no vitals filed for this visit.  Treatment Nustep Step ups  Knee ext Knee flexion  walking laps  Resisted gait  PT Short Term Goals - 12/14/21 1451       PT SHORT TERM GOAL #1   Title independent with initial HEP    Time 2    Period Weeks    Status On-going               PT Long Term Goals - 12/30/21 1610       PT LONG TERM GOAL #1   Title Understand fall risks, the importance of exercise and flexibility to health and function    Status Partially Met      PT LONG TERM GOAL #2   Title decrease TUG time to 13 seconds    Status Partially Met      PT LONG TERM GOAL #3   Title walk around our building  without rest     Status On-going      PT LONG TERM GOAL #4   Title increase LE strength to 4+/5    Status On-going      PT LONG TERM GOAL #5   Title increase Berg blance test score to 46/56    Status On-going                     Patient will benefit from skilled therapeutic intervention in order to improve the following deficits and impairments:     Visit Diagnosis: No diagnosis found.     Problem List Patient Active Problem List   Diagnosis Date Noted   Acute pain of left shoulder 11/12/2021   Leukocytes in urine 11/12/2021   Urinary frequency 11/12/2021   Thrush 10/08/2021   Hemiplegia, dominant side S/P CVA (cerebrovascular accident) ([place]) 09/11/2021   Insomnia    Prediabetes    Chronic kidney disease (CKD), stage IV (severe) ([place])    Basal ganglia infarction ([place]) 07/29/2021   Transaminitis 07/27/2021   UTI (urinary tract infection) 07/27/2021   CVA (cerebral vascular accident) ([place]) 07/27/2021   Fall at home, initial encounter 07/27/2021   Hyperglycemia 07/27/2021   CKD (chronic kidney disease), stage IV ([place]) 07/27/2021   Cholelithiasis 07/27/2021   Hypoxia 07/27/2021   Nausea and vomiting 27/12/8673   Acute metabolic encephalopathy 44/92/0100   Normocytic anemia 07/27/2021   Chronic back pain 07/27/2021   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[place]) 06/22/2021   Closed fracture of first lumbar vertebra with routine healing 02/03/2021   Closed fracture of multiple ribs 11/18/2020   Anxiety 01/29/2020   Leg pain, bilateral 01/29/2020   Ingrown toenail 07/13/2019   Lumbar spondylosis 05/02/2018   Pain in left knee 03/09/2018   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   Trochanteric bursitis of left hip 01/16/2018   Preventative health care 09/26/2017   HTN (hypertension) 07/19/2015   Hyperlipidemia 07/19/2015   Great toe pain 02/11/2014   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 [place], [name], [address] Phone: [phone]   Fax:  [phone]  Name: William [name]  MRN: [id number] Date of Birth: 1940-11-17

## 2022-01-05 ENCOUNTER — Encounter: Payer: Self-pay | Admitting: Occupational Therapy

## 2022-01-05 ENCOUNTER — Ambulatory Visit: Payer: PPO | Admitting: Occupational Therapy

## 2022-01-05 ENCOUNTER — Ambulatory Visit: Payer: PPO

## 2022-01-05 ENCOUNTER — Other Ambulatory Visit (HOSPITAL_BASED_OUTPATIENT_CLINIC_OR_DEPARTMENT_OTHER): Payer: Self-pay

## 2022-01-05 DIAGNOSIS — R2689 Other abnormalities of gait and mobility: Secondary | ICD-10-CM

## 2022-01-05 DIAGNOSIS — Z9181 History of falling: Secondary | ICD-10-CM

## 2022-01-05 DIAGNOSIS — R278 Other lack of coordination: Secondary | ICD-10-CM

## 2022-01-05 DIAGNOSIS — R41840 Attention and concentration deficit: Secondary | ICD-10-CM

## 2022-01-05 DIAGNOSIS — I69318 Other symptoms and signs involving cognitive functions following cerebral infarction: Secondary | ICD-10-CM

## 2022-01-05 DIAGNOSIS — R296 Repeated falls: Secondary | ICD-10-CM

## 2022-01-05 DIAGNOSIS — R262 Difficulty in walking, not elsewhere classified: Secondary | ICD-10-CM

## 2022-01-05 DIAGNOSIS — I69354 Hemiplegia and hemiparesis following cerebral infarction affecting left non-dominant side: Secondary | ICD-10-CM

## 2022-01-05 NOTE — Therapy (Unsigned)
OUTPATIENT OCCUPATIONAL THERAPY TREATMENT NOTE   Patient Name: William [name] MRN: [id number] DOB:1940/10/31, 81 y.o., male Today's Date: 01/05/2022  PCP: Roma Schanz, DO REFERRING PROVIDER: Carollee Herter, Alferd Apa, DO   OT End of Session - 01/05/22 1506     Visit Number 21   Number of Visits 22   Date for OT Re-Evaluation 01/13/22    Authorization Type [employer]    Authorization Time Period VL: [place]    Progress Note Due on Visit 20    OT Start Time 1445   OT Stop Time 1527   OT Time Calculation (min) 42 min   Activity Tolerance Patient tolerated treatment well    Behavior During Therapy Flat affect;Restless            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   Mild, related to stroke history   Nocturia    Renal insufficiency 06/25/2013   S/P carotid endarterectomy    BILATERAL ICA--  PATENT PER DUPLEX  05-19-2012   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   Right calf   Urinary frequency    Vitamin D deficiency    Past Surgical History:  Procedure Laterality Date   APPENDECTOMY  AS CHILD   CARDIOVASCULAR STRESS TEST  03-27-2012  DR CRENSHAW   LOW RISK LEXISCAN STUDY-- PROBABLE NORMAL PERFUSION AND SOFT TISSUE ATTENUATION/  NO ISCHEMIA/ EF 51%   CAROTID ENDARTERECTOMY Bilateral LEFT  11-12-2008  DR GREG HAYES   RIGHT ICA  2006   ([facility])   CYSTOSCOPY W/ RETROGRADES Bilateral 06/22/2021   Procedure: CYSTOSCOPY WITH RETROGRADE PYELOGRAM;  Surgeon: Franchot Gallo, MD;  Location: [facility];  Service: Urology;  Laterality: Bilateral;   CYSTOSCOPY WITH LITHOLAPAXY N/A 02/26/2013   Procedure: CYSTOSCOPY WITH LITHOLAPAXY;  Surgeon: Franchot Gallo, MD;  Location: [facility];  Service: Urology;  Laterality: N/A;   EYE SURGERY  Jan. 2016   cataract surgery both eyes   INGUINAL HERNIA REPAIR Right 11-08-2006   IR KYPHO EA ADDL LEVEL THORACIC OR LUMBAR  02/12/2021   IR RADIOLOGIST EVAL & MGMT  02/18/2021   MASS EXCISION N/A 03/03/2016   Procedure: EXCISION OF BACK  MASS;  Surgeon: Stark Klein, MD;  Location: [facility];  Service: General;  Laterality: N/A;   MOHS SURGERY Left 1/ 2016   Dr Nevada Crane-- Basal cell   PROSTATE SURGERY     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-C N/A 06/22/2021   Procedure: TRANSURETHRAL RESECTION OF BLADDER TUMOR;  Surgeon: Franchot Gallo, MD;  Location: [facility];  Service: Urology;  Laterality: N/A;   TRANSURETHRAL RESECTION OF PROSTATE N/A 02/26/2013   Procedure: TRANSURETHRAL RESECTION OF THE PROSTATE WITH GYRUS INSTRUMENTS;  Surgeon: Franchot Gallo, MD;  Location: [facility];  Service: Urology;  Laterality: N/A;   TRANSURETHRAL RESECTION OF PROSTATE N/A 06/22/2021   Procedure: TRANSURETHRAL RESECTION OF THE PROSTATE (TURP);  Surgeon: Franchot Gallo, MD;  Location: [facility];  Service: Urology;  Laterality: N/A;   Patient Active Problem List   Diagnosis Date Noted   Acute pain of left shoulder 11/12/2021   Leukocytes in urine 11/12/2021   Urinary frequency 11/12/2021   Thrush 10/08/2021   Hemiplegia, dominant side S/P CVA (cerebrovascular accident) ([place]) 09/11/2021   Insomnia    Prediabetes    Chronic kidney disease (CKD), stage IV (severe) ([place])    Basal ganglia infarction  ([place]) 07/29/2021   Transaminitis 07/27/2021   UTI (urinary tract infection) 07/27/2021   CVA (cerebral vascular accident) ([place]) 07/27/2021   Fall at home, initial encounter 07/27/2021   Hyperglycemia 07/27/2021   CKD (chronic kidney disease), stage IV ([place]) 07/27/2021   Cholelithiasis 07/27/2021   Hypoxia 07/27/2021   Nausea and vomiting 46/56/8127   Acute metabolic encephalopathy 51/70/0174   Normocytic anemia 07/27/2021   Chronic back pain 07/27/2021   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[place]) 06/22/2021   Closed fracture of first lumbar vertebra with routine healing 02/03/2021   Closed fracture of multiple ribs 11/18/2020   Anxiety 01/29/2020   Leg pain, bilateral 01/29/2020   Ingrown toenail 07/13/2019   Lumbar spondylosis 05/02/2018   Pain in left knee 03/09/2018   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   Trochanteric bursitis of left hip 01/16/2018   Preventative health care 09/26/2017   HTN (hypertension) 07/19/2015   Hyperlipidemia 07/19/2015   Great toe pain 02/11/2014   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   Obesity (BMI 30-39.9) 06/25/2013   Renal insufficiency 06/25/2013   Weakness of left arm 06/25/2013   Sebaceous cyst 03/03/2011   Sprain of lumbar region 07/31/2010   Rib pain, left 08/29/2009   Carotid artery stenosis, asymptomatic, bilateral 05/02/2009   Eczema, atopic 05/31/2008   Vitamin D deficiency 03/01/2008   BPH (benign prostatic hyperplasia) 08/06/2007   Fasting hyperglycemia 12/21/2006   History of right MCA infarct 2006    ONSET DATE: 07/27/21  REFERRING DIAG: R41.4 (ICD-10-CM) - Hemi-inattention I63.9 (ICD-10-CM) - Cerebrovascular accident (CVA), unspecified mechanism ([place])   THERAPY DIAG:  Attention and concentration deficit  Other lack of coordination  History of falling  Other abnormalities of gait and mobility  Other symptoms and signs involving cognitive functions following cerebral  infarction  PERTINENT HISTORY: L basal ganglia and corona radiata infarct 07/27/21 (had documented AMS for about 5 days prior w/ hospital work-up including MRI negative and symptoms attributed to a UTI); L lower homonymous quadrantanopia. Other PMH includes R MCA infarct in 2006 w/ L-sided hemiparesis, NCD, carotid artery disease (bilateral), HTN, HLD,CKD3, and hx of bladder cancer  PRECAUTIONS: Fall risk  SUBJECTIVE:   SUBJECTIVE ASSESSMENT: Pt's spouse reports he is still having some trouble w/ continence  PAIN: Are you having pain? No   OBJECTIVE:   TODAY'S TREATMENT - 01/05/22:  Sequencing 3- and 4-step sequencing activity requiring pt to order cards w/ first pictorial IADL task and then written IADL tasks in the correct order. Pt able to complete 4-step sequence w/ Mod A, requiring most assist w/ recognition of errors and subsequent problem-solving. OT incorporated up/down grading as appropriate, extended time for processing, and breakdown of task to facilitate success.  Pegboard Copying pattern onto pegboard w/ easy grip pegs to facilitate NMR of L hand coordination, grasp and release w/ pinch prehension,  visual perception, and intrinsic hand strengthening. Pt able to complete activity w/ mostly min cues and extended time for accuracy w/ spatial orientation when copying pattern and demonstrated min drops. OT provided add'l cues for focusing on use of LUE vs compensating w/ RUE     PATIENT EDUCATION: Ongoing caregiver education, particularly related to cues and facilitation for increased pt participation; reviewed visual perceptual compensatory strategies Person educated: Patient and Spouse Education method: Explanation Education comprehension: verbalized understanding   GOALS: Goals reviewed with patient? Yes   SHORT TERM GOALS: Target date: 12/29/2021   STG   Status:  1 Pt/caregiver will verbalize understanding of visual perceptual compensatory strategies Baseline: Decreased  knowledge of visual deficits and strategies Met - 12/31/21  2 Pt will complete symbol cancellation task w/ at least 50% accuracy using visual compensatory strategies to indicate improved attention Baseline: 40% accuracy w/ multiple errors Met - 11/17/21  3 Pt will participate in bilateral coordination activity w/ no more than 3 cues for incorporation of LUE Baseline: L-sided inattention Met - 12/22/21: completed task w/ no cues for bil integration      LONG TERM GOALS: Target date: 01/13/2022   LTG   Status:  1 Pt will be able to don overhead shirt w/ SPV and verbal/gestural cues prn in at least 1 attempt Baseline: Min A to don/doff shirt sleeve on L side Progressing  2 Pt will be able to thread bottoms on BLEs or don socks w/ Mod A Baseline: Max A for LB dressing Progressing  3 Pt will demonstrate supine-to-sit transition w/ cues from caregiver and incorporation of AE/DME prn Baseline: Mod-Max A w/ bed mobility Met - 12/03/21; SPV w/ bed ladder and cues  4 Pt will be able to use TV remote (or adaptive remote) to complete 1 task (e.g., turn TV on/of, turn volume up/down) w/ SPV Baseline: Difficulty  Not Progressing - d/c due to continued difficulty and agitation w/ task at home  5 Pt will increase Barthel Index score by at least 5 to indicate improved level of participation in BADLs Baseline: 55/100 Progressing  6 Pt will be able to make a peanut butter and jelly sandwich w/ SPV and verbal/tactile cues prn Progressing     ASSESSMENT:   CLINICAL IMPRESSION: Considering improvements observed in recent sessions, OT increased challenge on visual perceptual skills and sequencing this session. Visual perceptual skills in particular appear to have progressed w/ pt demonstrating improved attention to task, visual scanning, pattern recognition, and visual memory. When attempting pegboard activity earlier in course of therapy, pt required max assist for success. OT also discussed participation in ADLs based  on Barthel Index w/ pt and his spouse reporting improvement in specific task components involved in BADLs, but similar overall level of participation w/ score on Barthel Index unchanged. Pt will continue to benefit from skilled OT services to build on these improvements and progress toward remaining functional goals.   PERFORMANCE DEFICITS in functional skills including ADLs, IADLs, coordination, dexterity, [place], [place], mobility, balance, and vision, cognitive skills including attention, memory, orientation, problem solving, safety awareness, sequencing, and temperament/personality, and psychosocial skills including environmental adaptation, interpersonal interactions, and routines and behaviors.    IMPAIRMENTS are limiting patient from ADLs, IADLs, rest and sleep, leisure, and social participation.    COMORBIDITIES may have co-morbidities that affects occupational performance. Patient will benefit from skilled OT to address above impairments and improve overall function.     PLAN: OT FREQUENCY: 2x/week   OT DURATION: 10 weeks  PLANNED INTERVENTIONS: self care/ADL training, therapeutic exercise, therapeutic activity, neuromuscular re-education, balance training, functional mobility training, aquatic therapy, splinting, moist heat, cryotherapy, patient/family education, cognitive remediation/compensation, visual/perceptual remediation/compensation, psychosocial skills training, energy conservation, and DME and/or AE instructions   RECOMMENDED OTHER SERVICES: Currently receiving PT services at this location   CONSULTED AND AGREED WITH PLAN OF CARE: Patient and family member/caregiver   PLAN FOR NEXT SESSION: Preparatory exercise(s) for L-sided attention, proprioception and kinesthetic sense prior to practice of ADL tasks; continue to address bilateral coordination and attention to task   Kathrine Cords, MSOT, OTR/L 01/05/22, 5:26 PM

## 2022-01-06 ENCOUNTER — Other Ambulatory Visit (HOSPITAL_BASED_OUTPATIENT_CLINIC_OR_DEPARTMENT_OTHER): Payer: Self-pay

## 2022-01-07 ENCOUNTER — Encounter: Payer: Self-pay | Admitting: Occupational Therapy

## 2022-01-07 ENCOUNTER — Other Ambulatory Visit (HOSPITAL_BASED_OUTPATIENT_CLINIC_OR_DEPARTMENT_OTHER): Payer: Self-pay

## 2022-01-07 ENCOUNTER — Encounter: Payer: Self-pay | Admitting: Physical Therapy

## 2022-01-07 ENCOUNTER — Ambulatory Visit: Payer: PPO | Admitting: Physical Therapy

## 2022-01-07 ENCOUNTER — Ambulatory Visit: Payer: PPO | Admitting: Occupational Therapy

## 2022-01-07 DIAGNOSIS — E559 Vitamin D deficiency, unspecified: Secondary | ICD-10-CM | POA: Diagnosis not present

## 2022-01-07 DIAGNOSIS — Z9181 History of falling: Secondary | ICD-10-CM

## 2022-01-07 DIAGNOSIS — I69354 Hemiplegia and hemiparesis following cerebral infarction affecting left non-dominant side: Secondary | ICD-10-CM

## 2022-01-07 DIAGNOSIS — D631 Anemia in chronic kidney disease: Secondary | ICD-10-CM | POA: Diagnosis not present

## 2022-01-07 DIAGNOSIS — R41840 Attention and concentration deficit: Secondary | ICD-10-CM

## 2022-01-07 DIAGNOSIS — R278 Other lack of coordination: Secondary | ICD-10-CM

## 2022-01-07 DIAGNOSIS — I129 Hypertensive chronic kidney disease with stage 1 through stage 4 chronic kidney disease, or unspecified chronic kidney disease: Secondary | ICD-10-CM | POA: Diagnosis not present

## 2022-01-07 DIAGNOSIS — N1832 Chronic kidney disease, stage 3b: Secondary | ICD-10-CM | POA: Diagnosis not present

## 2022-01-07 DIAGNOSIS — R2689 Other abnormalities of gait and mobility: Secondary | ICD-10-CM

## 2022-01-07 DIAGNOSIS — I679 Cerebrovascular disease, unspecified: Secondary | ICD-10-CM | POA: Diagnosis not present

## 2022-01-07 DIAGNOSIS — R296 Repeated falls: Secondary | ICD-10-CM

## 2022-01-07 DIAGNOSIS — I69318 Other symptoms and signs involving cognitive functions following cerebral infarction: Secondary | ICD-10-CM

## 2022-01-07 DIAGNOSIS — R262 Difficulty in walking, not elsewhere classified: Secondary | ICD-10-CM

## 2022-01-07 NOTE — Therapy (Signed)
[place] [place], [name], [address] Phone: [phone]   Fax:  [phone]  Physical Therapy Treatment  Patient Details  Name: William [name] MRN: [id number] Date of Birth: 26-May-1941 Referring Provider (PT): Supple   Encounter Date: 01/07/2022   PT End of Session - 01/07/22 1405     Visit Number 26    Date for PT Re-Evaluation 02/01/22    PT Start Time 1400    PT Stop Time 1445    PT Time Calculation (min) 45 min    Equipment Utilized During Treatment Gait belt    Activity Tolerance Patient tolerated treatment well    Behavior During Therapy [facility] for tasks assessed/performed;Flat affect;Impulsive             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   Mild, related to stroke history   Nocturia    Renal insufficiency 06/25/2013   S/P carotid endarterectomy    BILATERAL ICA--  PATENT PER DUPLEX  05-19-2012   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   Right calf   Urinary frequency    Vitamin D deficiency     Past Surgical History:  Procedure Laterality Date   APPENDECTOMY  AS CHILD   CARDIOVASCULAR STRESS TEST  03-27-2012  DR CRENSHAW   LOW RISK LEXISCAN STUDY-- PROBABLE NORMAL PERFUSION AND SOFT TISSUE ATTENUATION/  NO ISCHEMIA/ EF 51%   CAROTID ENDARTERECTOMY Bilateral LEFT   11-12-2008  DR GREG HAYES   RIGHT ICA  2006  ([facility])   CYSTOSCOPY W/ RETROGRADES Bilateral 06/22/2021   Procedure: CYSTOSCOPY WITH RETROGRADE PYELOGRAM;  Surgeon: Franchot Gallo, MD;  Location: [facility];  Service: Urology;  Laterality: Bilateral;   CYSTOSCOPY WITH LITHOLAPAXY N/A 02/26/2013   Procedure: CYSTOSCOPY WITH LITHOLAPAXY;  Surgeon: Franchot Gallo, MD;  Location: [facility];  Service: Urology;  Laterality: N/A;   EYE SURGERY  Jan. 2016   cataract surgery both eyes   INGUINAL HERNIA REPAIR Right 11-08-2006   IR KYPHO EA ADDL LEVEL THORACIC OR LUMBAR  02/12/2021   IR RADIOLOGIST EVAL & MGMT  02/18/2021   MASS EXCISION N/A 03/03/2016   Procedure: EXCISION OF BACK  MASS;  Surgeon: Stark Klein, MD;  Location: [facility];  Service: General;  Laterality: N/A;   MOHS SURGERY Left 1/ 2016   Dr Nevada Crane-- Basal cell   PROSTATE SURGERY     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-C N/A 06/22/2021   Procedure: TRANSURETHRAL RESECTION OF BLADDER TUMOR;  Surgeon: Franchot Gallo, MD;  Location: [facility];  Service: Urology;  Laterality: N/A;   TRANSURETHRAL RESECTION OF PROSTATE N/A 02/26/2013   Procedure: TRANSURETHRAL RESECTION OF THE PROSTATE WITH GYRUS INSTRUMENTS;  Surgeon: Franchot Gallo, MD;  Location:  [facility];  Service: Urology;  Laterality: N/A;   TRANSURETHRAL RESECTION OF PROSTATE N/A 06/22/2021   Procedure: TRANSURETHRAL RESECTION OF THE PROSTATE (TURP);  Surgeon: Marcine Matar, MD;  Location: [facility];  Service: Urology;  Laterality: N/A;    There were no vitals filed for this visit.   Subjective Assessment - 01/07/22 1405     Subjective I am doing great, no pain and no falls    Currently in Pain? No/denies                               [facility] Adult PT Treatment/Exercise - 01/07/22 0001       Transfers   Comments worked on kneeling  down on a pad and then getting up from this position using the bed x 4      Ambulation/Gait   Gait Comments stairs step over step, 200' with CGA x 2      High Level Balance   High Level Balance Comments ball kicks, beach ball taps, catches, high toe touches, standing cone reaches both arms      Lumbar Exercises: Aerobic   Nustep levewl 5 x 5 minutes      Lumbar Exercises: Seated   Long Arc Quad on Chair Left;3 sets;10 reps    LAQ on Chair Weights (lbs) 3    Sit to Stand 10 reps    Other Seated Lumbar Exercises marching 2# 3x10                       PT Short Term Goals - 12/14/21 1451       PT SHORT TERM GOAL #1   Title independent with initial HEP    Time 2    Period Weeks    Status On-going               PT Long Term Goals - 01/07/22 1406       PT LONG TERM GOAL #1   Title Understand fall risks, the importance of exercise and flexibility to health and function    Status Partially Met      PT LONG TERM GOAL #2   Title decrease TUG time to 13 seconds    Status Partially Met      PT LONG TERM GOAL #3   Title walk around our building without rest     Status On-going      PT LONG TERM GOAL #4   Title increase LE strength to 4+/5    Status On-going                   Plan - 01/07/22 1559     Clinical Impression Statement Worked a little more on the balance and the transfers from kneeling, he did well with this but we could not get him down on his hip or backside as he had fear of this.  He did well with gait today, just needed a cue 1x to slow down.  He seems to be doing better with not being as easily distracted, I still elect at times to do new things or things he struggles with in a room without distraction, he still has difficulty with stepping over objects sideways    PT Next Visit Plan continue to work with him on function and safety, getting up from floor    Consulted and Agree with Plan of Care Patient  Patient  will benefit from skilled therapeutic intervention in order to improve the following deficits and impairments:  Abnormal gait, Decreased coordination, Decreased range of motion, Difficulty walking, Increased muscle spasms, Cardiopulmonary status limiting activity, Decreased endurance, Decreased activity tolerance, Pain, Impaired flexibility, Decreased balance, Decreased mobility, Decreased strength, Postural dysfunction, Increased edema  Visit Diagnosis: Other lack of coordination  History of falling  Other abnormalities of gait and mobility  Difficulty in walking, not elsewhere classified  Hemiplegia and hemiparesis following cerebral infarction affecting left non-dominant side (HCC)  Repeated falls     Problem List Patient Active Problem List   Diagnosis Date Noted   Acute pain of left shoulder 11/12/2021   Leukocytes in urine 11/12/2021   Urinary frequency 11/12/2021   Thrush 10/08/2021   Hemiplegia, dominant side S/P CVA (cerebrovascular accident) ([place]) 09/11/2021   Insomnia    Prediabetes    Chronic kidney disease (CKD), stage IV (severe) (HCC)    Basal ganglia infarction ([place]) 07/29/2021   Transaminitis 07/27/2021   UTI (urinary tract infection) 07/27/2021   CVA (cerebral vascular accident) ([place]) 07/27/2021   Fall at home, initial encounter 07/27/2021   Hyperglycemia 07/27/2021   CKD (chronic kidney disease), stage IV ([place]) 07/27/2021   Cholelithiasis 07/27/2021   Hypoxia 07/27/2021   Nausea and vomiting 15/72/6203   Acute metabolic encephalopathy 55/97/4163   Normocytic anemia 07/27/2021   Chronic back pain 07/27/2021   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[place]) 06/22/2021   Closed fracture of first lumbar vertebra with routine healing 02/03/2021   Closed fracture of multiple ribs 11/18/2020   Anxiety 01/29/2020   Leg pain, bilateral 01/29/2020   Ingrown toenail 07/13/2019   Lumbar spondylosis 05/02/2018   Pain in left knee 03/09/2018    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   Trochanteric bursitis of left hip 01/16/2018   Preventative health care 09/26/2017   HTN (hypertension) 07/19/2015   Hyperlipidemia 07/19/2015   Great toe pain 02/11/2014   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 [place], [name], [address] Phone: [phone]   Fax:  [phone]  Name: William [name] MRN: [id number] Date of Birth: 01-08-1941

## 2022-01-07 NOTE — Therapy (Signed)
OUTPATIENT OCCUPATIONAL THERAPY TREATMENT NOTE & RECERTIFICATION   Patient Name: William [name] MRN: [id number] DOB:[birth year], 81 y.o., male Today's Date: 01/07/2022  PCP: Roma Schanz, DO REFERRING PROVIDER: Carollee Herter, Alferd Apa, DO   OT End of Session - 01/07/22 1451     Visit Number 22    Number of Visits 31    Date for OT Re-Evaluation 02/12/22    Authorization Type [employer]    Authorization Time Period VL: [place]    Progress Note Due on Visit 30    OT Start Time 1447    OT Stop Time 1528    OT Time Calculation (min) 41 min    Activity Tolerance Patient tolerated treatment well    Behavior During Therapy Flat affect;Restless            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   Mild, related to stroke history   Nocturia    Renal insufficiency 06/25/2013   S/P carotid endarterectomy    BILATERAL ICA--  PATENT PER DUPLEX  05-19-2012   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   Right calf   Urinary frequency    Vitamin D deficiency    Past Surgical History:  Procedure Laterality Date   APPENDECTOMY  AS CHILD   CARDIOVASCULAR STRESS TEST  03-27-2012  DR CRENSHAW   LOW RISK LEXISCAN STUDY-- PROBABLE NORMAL PERFUSION AND SOFT TISSUE ATTENUATION/  NO ISCHEMIA/ EF 51%   CAROTID ENDARTERECTOMY Bilateral LEFT  11-12-2008  DR GREG HAYES    RIGHT ICA  2006  ([facility])   CYSTOSCOPY W/ RETROGRADES Bilateral 06/22/2021   Procedure: CYSTOSCOPY WITH RETROGRADE PYELOGRAM;  Surgeon: Franchot Gallo, MD;  Location: [facility];  Service: Urology;  Laterality: Bilateral;   CYSTOSCOPY WITH LITHOLAPAXY N/A 02/26/2013   Procedure: CYSTOSCOPY WITH LITHOLAPAXY;  Surgeon: Franchot Gallo, MD;  Location: [facility];  Service: Urology;  Laterality: N/A;   EYE SURGERY  Jan. 2016   cataract surgery both eyes   INGUINAL HERNIA REPAIR Right 11-08-2006   IR KYPHO EA ADDL LEVEL THORACIC OR LUMBAR  02/12/2021   IR RADIOLOGIST EVAL & MGMT  02/18/2021   MASS EXCISION N/A 03/03/2016   Procedure: EXCISION OF BACK  MASS;  Surgeon: Stark Klein, MD;  Location: [facility];  Service: General;  Laterality: N/A;   MOHS SURGERY Left 1/ 2016   Dr Nevada Crane-- Basal cell   PROSTATE SURGERY     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-C N/A 06/22/2021   Procedure: TRANSURETHRAL RESECTION OF BLADDER TUMOR;  Surgeon: Franchot Gallo, MD;  Location: [facility];  Service: Urology;  Laterality: N/A;   TRANSURETHRAL RESECTION OF PROSTATE N/A 02/26/2013   Procedure: TRANSURETHRAL RESECTION OF THE PROSTATE WITH GYRUS INSTRUMENTS;  Surgeon: Annie Main  Dahlstedt, MD;  Location: [facility];  Service: Urology;  Laterality: N/A;   TRANSURETHRAL RESECTION OF PROSTATE N/A 06/22/2021   Procedure: TRANSURETHRAL RESECTION OF THE PROSTATE (TURP);  Surgeon: Franchot Gallo, MD;  Location: [facility];  Service: Urology;  Laterality: N/A;   Patient Active Problem List   Diagnosis Date Noted   Acute pain of left shoulder 11/12/2021   Leukocytes in urine 11/12/2021   Urinary frequency 11/12/2021   Thrush 10/08/2021   Hemiplegia, dominant side S/P CVA (cerebrovascular accident) ([place]) 09/11/2021   Insomnia    Prediabetes    Chronic kidney disease (CKD), stage IV (severe) ([place])    Basal  ganglia infarction ([place]) 07/29/2021   Transaminitis 07/27/2021   UTI (urinary tract infection) 07/27/2021   CVA (cerebral vascular accident) ([place]) 07/27/2021   Fall at home, initial encounter 07/27/2021   Hyperglycemia 07/27/2021   CKD (chronic kidney disease), stage IV ([place]) 07/27/2021   Cholelithiasis 07/27/2021   Hypoxia 07/27/2021   Nausea and vomiting 79/89/2119   Acute metabolic encephalopathy 41/74/0814   Normocytic anemia 07/27/2021   Chronic back pain 07/27/2021   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[place]) 06/22/2021   Closed fracture of first lumbar vertebra with routine healing 02/03/2021   Closed fracture of multiple ribs 11/18/2020   Anxiety 01/29/2020   Leg pain, bilateral 01/29/2020   Ingrown toenail 07/13/2019   Lumbar spondylosis 05/02/2018   Pain in left knee 03/09/2018   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   Trochanteric bursitis of left hip 01/16/2018   Preventative health care 09/26/2017   HTN (hypertension) 07/19/2015   Hyperlipidemia 07/19/2015   Great toe pain 02/11/2014   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   Obesity (BMI 30-39.9) 06/25/2013   Renal insufficiency 06/25/2013   Weakness of left arm 06/25/2013   Sebaceous cyst 03/03/2011   Sprain of lumbar region 07/31/2010   Rib pain, left 08/29/2009   Carotid artery stenosis, asymptomatic, bilateral 05/02/2009   Eczema, atopic 05/31/2008   Vitamin D deficiency 03/01/2008   BPH (benign prostatic hyperplasia) 08/06/2007   Fasting hyperglycemia 12/21/2006   History of right MCA infarct 2006    ONSET DATE: 07/27/21  REFERRING DIAG: R41.4 (ICD-10-CM) - Hemi-inattention I63.9 (ICD-10-CM) - Cerebrovascular accident (CVA), unspecified mechanism ([place])   THERAPY DIAG:  Attention and concentration deficit  Other lack of coordination  Other symptoms and signs involving cognitive functions following cerebral infarction  Other abnormalities of gait and mobility  History  of falling  PERTINENT HISTORY: L basal ganglia and corona radiata infarct 07/27/21 (had documented AMS for about 5 days prior w/ hospital work-up including MRI negative and symptoms attributed to a UTI); L lower homonymous quadrantanopia. Other PMH includes R MCA infarct in 2006 w/ L-sided hemiparesis, NCD, carotid artery disease (bilateral), HTN, HLD,CKD3, and hx of bladder cancer  PRECAUTIONS: Fall risk  SUBJECTIVE:   SUBJECTIVE ASSESSMENT: "This isn't fun.[name] KitchenMarland KitchenI want to practice getting in the car so we can leave"  PAIN: Are you having pain? No   OBJECTIVE:   TODAY'S TREATMENT - 01/07/22:  LB Dressing Practiced LB dressing, first threading BLEs w/ use of reacher and then attempting w/out reacher. Able to complete at [facility] A level w/ use of reacher demonstrating good sequencing and cues for problem-solving between tasks (e.g., switching from use of reacher to pulling pants up w/ both hands). Able to complete w/out reacher at varying levels of assist from Min A-SPV level w/ cues primarily for sequencing and redirection/attention to task  Pt able to doff and don both shoes at [facility] w/ verbal cues only for sequencing and problem-solving prn  Connect 4 Connect 4 used to facilitate [place], LUE functional use, and visual perception/problem-solving. Pt required increased time and required max verbal/tactile cues to prevent using RUE and for sequencing of the game; completed w/ min drops. 2 rounds played w/ focus on using LUE; OT moved game board during 2nd set to incorporate functional reach w/ mild increased difficulty    PATIENT EDUCATION: Ongoing caregiver education, particularly related to cues and facilitation for increased pt participation; reviewed visual perceptual compensatory strategies Person educated: Patient and Spouse Education method: Explanation Education comprehension: verbalized understanding   GOALS: Goals reviewed with patient? Yes   SHORT TERM GOALS: Target date: 12/29/2021    STG   Status:  1 Pt/caregiver will verbalize understanding of visual perceptual compensatory strategies Baseline: Decreased knowledge of visual deficits and strategies Met - 12/31/21  2 Pt will complete symbol cancellation task w/ at least 50% accuracy using visual compensatory strategies to indicate improved attention Baseline: 40% accuracy w/ multiple errors Met - 11/17/21  3 Pt will participate in bilateral coordination activity w/ no more than 3 cues for incorporation of LUE Baseline: L-sided inattention Met - 12/22/21: completed task w/ no cues for bil integration      LONG TERM GOALS: Target date: 01/13/2022   LTG   Status:  1 Pt will be able to don overhead shirt w/ SPV and verbal/gestural cues prn in at least 1 attempt Baseline: Min A to don/doff shirt sleeve on L side Progressing  2 Pt will be able to thread bottoms on BLEs or don socks w/ Mod A Baseline: Max A for LB dressing Met - 01/07/22   Pt will demonstrate donning bottoms, incorporating use of AE prn, w/ SPV for safety in at least 2 attempts Baseline: Min A to thread BLE on 01/07/22 Initial  3 Pt will demonstrate supine-to-sit transition w/ cues from caregiver and incorporation of AE/DME prn Baseline: Mod-Max A w/ bed mobility Met - 12/03/21; SPV w/ bed ladder and cues  4 Pt will be able to use TV remote (or adaptive remote) to complete 1 task (e.g., turn TV on/of, turn volume up/down) w/ SPV Baseline: Difficulty  Not Progressing - d/c due to continued difficulty and agitation w/ task at home  5 Pt will increase Barthel Index score by at least 5 to indicate improved level of participation in BADLs Baseline: 55/100 Progressing  6 Pt will be able to make a peanut butter and jelly sandwich w/ SPV and verbal/tactile cues prn Progressing     ASSESSMENT:   CLINICAL IMPRESSION: Pt continues to progress well toward goals, demonstrating consistency w/ completing LB dressing at at least Min A level both while using reacher and w/out AE  this session, which is a significant improvement from report of completing w/ Max A at home at start of OP OT. Incorporating of LUE into functional tasks at non-dominant level, attention to L side, balance, and sequencing has improved w/ pt continuing to demonstrate limitations w/ attention to task, problem-solving, perseveration, and executive functioning. OT continued to incorporate assist for errorless learning prn, auditory/verbal/tactile cues, and immediate feedback and knowledge of results throughout based on current EBP for NCD. Pt will continue to benefit from skilled OT services to build on these improvements and progress toward remaining functional goals.   PERFORMANCE DEFICITS in functional skills including ADLs, IADLs, coordination, dexterity, [place], GMC, mobility, balance, and vision, cognitive  skills including attention, memory, orientation, problem solving, safety awareness, sequencing, and temperament/personality, and psychosocial skills including environmental adaptation, interpersonal interactions, and routines and behaviors.    IMPAIRMENTS are limiting patient from ADLs, IADLs, rest and sleep, leisure, and social participation.    COMORBIDITIES may have co-morbidities that affects occupational performance. Patient will benefit from skilled OT to address above impairments and improve overall function.     PLAN: OT FREQUENCY: 2x/week   OT DURATION: 10 weeks   PLANNED INTERVENTIONS: self care/ADL training, therapeutic exercise, therapeutic activity, neuromuscular re-education, balance training, functional mobility training, aquatic therapy, splinting, moist heat, cryotherapy, patient/family education, cognitive remediation/compensation, visual/perceptual remediation/compensation, psychosocial skills training, energy conservation, and DME and/or AE instructions   RECOMMENDED OTHER SERVICES: Currently receiving PT services at this location   CONSULTED AND AGREED WITH PLAN OF CARE:  Patient and family member/caregiver   PLAN FOR NEXT SESSION: Preparatory exercise(s) for L-sided attention, proprioception and kinesthetic sense prior to practice of ADL tasks; continue to address bilateral coordination and attention to task   Kathrine Cords, MSOT, OTR/L 01/07/22, 3:52 PM

## 2022-01-12 ENCOUNTER — Ambulatory Visit: Payer: PPO | Attending: Family Medicine | Admitting: Occupational Therapy

## 2022-01-12 ENCOUNTER — Ambulatory Visit: Payer: PPO | Admitting: Physical Therapy

## 2022-01-12 ENCOUNTER — Encounter: Payer: Self-pay | Admitting: Occupational Therapy

## 2022-01-12 ENCOUNTER — Encounter: Payer: Self-pay | Admitting: Physical Therapy

## 2022-01-12 DIAGNOSIS — Z9181 History of falling: Secondary | ICD-10-CM | POA: Diagnosis not present

## 2022-01-12 DIAGNOSIS — R262 Difficulty in walking, not elsewhere classified: Secondary | ICD-10-CM | POA: Diagnosis not present

## 2022-01-12 DIAGNOSIS — R278 Other lack of coordination: Secondary | ICD-10-CM

## 2022-01-12 DIAGNOSIS — R2689 Other abnormalities of gait and mobility: Secondary | ICD-10-CM

## 2022-01-12 DIAGNOSIS — M25512 Pain in left shoulder: Secondary | ICD-10-CM | POA: Insufficient documentation

## 2022-01-12 DIAGNOSIS — I69354 Hemiplegia and hemiparesis following cerebral infarction affecting left non-dominant side: Secondary | ICD-10-CM | POA: Diagnosis not present

## 2022-01-12 DIAGNOSIS — R296 Repeated falls: Secondary | ICD-10-CM | POA: Insufficient documentation

## 2022-01-12 DIAGNOSIS — M6281 Muscle weakness (generalized): Secondary | ICD-10-CM | POA: Insufficient documentation

## 2022-01-12 DIAGNOSIS — R41840 Attention and concentration deficit: Secondary | ICD-10-CM | POA: Diagnosis not present

## 2022-01-12 DIAGNOSIS — I69318 Other symptoms and signs involving cognitive functions following cerebral infarction: Secondary | ICD-10-CM | POA: Insufficient documentation

## 2022-01-12 NOTE — Therapy (Signed)
OUTPATIENT OCCUPATIONAL THERAPY TREATMENT NOTE  Patient Name: William [name] MRN: [id number] DOB:10/25/1940, 81 y.o., male Today's Date: 01/12/2022  PCP: Roma Schanz, DO REFERRING PROVIDER: Carollee Herter, Alferd Apa, DO   OT End of Session - 01/12/22 1451     Visit Number 23    Number of Visits 31    Date for OT Re-Evaluation 02/12/22    Authorization Type [employer]    Authorization Time Period VL: [place]    Progress Note Due on Visit 30    OT Start Time 1447    OT Stop Time 1528    OT Time Calculation (min) 41 min    Activity Tolerance Patient tolerated treatment well    Behavior During Therapy Flat affect;Restless            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   Mild, related to stroke history   Nocturia    Renal insufficiency 06/25/2013   S/P carotid endarterectomy    BILATERAL ICA--  PATENT PER DUPLEX  05-19-2012   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   Right calf   Urinary frequency    Vitamin D deficiency    Past Surgical History:  Procedure Laterality Date   APPENDECTOMY  AS CHILD   CARDIOVASCULAR STRESS TEST  03-27-2012  DR CRENSHAW   LOW RISK LEXISCAN STUDY-- PROBABLE NORMAL PERFUSION AND SOFT TISSUE ATTENUATION/  NO ISCHEMIA/ EF 51%   CAROTID ENDARTERECTOMY Bilateral LEFT  11-12-2008  DR GREG HAYES   RIGHT ICA  2006   ([facility])   CYSTOSCOPY W/ RETROGRADES Bilateral 06/22/2021   Procedure: CYSTOSCOPY WITH RETROGRADE PYELOGRAM;  Surgeon: Franchot Gallo, MD;  Location: [facility];  Service: Urology;  Laterality: Bilateral;   CYSTOSCOPY WITH LITHOLAPAXY N/A 02/26/2013   Procedure: CYSTOSCOPY WITH LITHOLAPAXY;  Surgeon: Franchot Gallo, MD;  Location: [facility];  Service: Urology;  Laterality: N/A;   EYE SURGERY  Jan. 2016   cataract surgery both eyes   INGUINAL HERNIA REPAIR Right 11-08-2006   IR KYPHO EA ADDL LEVEL THORACIC OR LUMBAR  02/12/2021   IR RADIOLOGIST EVAL & MGMT  02/18/2021   MASS EXCISION N/A 03/03/2016   Procedure: EXCISION OF BACK  MASS;  Surgeon: Stark Klein, MD;  Location: [place];  Service: General;  Laterality: N/A;   MOHS SURGERY Left 1/ 2016   Dr Nevada Crane-- Basal cell   PROSTATE SURGERY     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-C N/A 06/22/2021   Procedure: TRANSURETHRAL RESECTION OF BLADDER TUMOR;  Surgeon: Franchot Gallo, MD;  Location: [facility];  Service: Urology;  Laterality: N/A;   TRANSURETHRAL RESECTION OF PROSTATE N/A 02/26/2013   Procedure: TRANSURETHRAL RESECTION OF THE PROSTATE WITH GYRUS INSTRUMENTS;  Surgeon: Franchot Gallo, MD;  Location: [place];  Service: Urology;  Laterality: N/A;   TRANSURETHRAL RESECTION OF PROSTATE N/A 06/22/2021   Procedure: TRANSURETHRAL RESECTION OF THE PROSTATE (TURP);  Surgeon: Franchot Gallo, MD;  Location: [facility];  Service: Urology;  Laterality: N/A;   Patient Active Problem List   Diagnosis Date Noted   Acute pain of left shoulder 11/12/2021   Leukocytes in urine 11/12/2021   Urinary frequency 11/12/2021   Thrush 10/08/2021   Hemiplegia, dominant side S/P CVA (cerebrovascular accident) ([place]) 09/11/2021   Insomnia    Prediabetes    Chronic kidney disease (CKD), stage IV (severe) ([place])    Basal ganglia infarction  ([place]) 07/29/2021   Transaminitis 07/27/2021   UTI (urinary tract infection) 07/27/2021   CVA (cerebral vascular accident) ([place]) 07/27/2021   Fall at home, initial encounter 07/27/2021   Hyperglycemia 07/27/2021   CKD (chronic kidney disease), stage IV ([place]) 07/27/2021   Cholelithiasis 07/27/2021   Hypoxia 07/27/2021   Nausea and vomiting 68/04/5725   Acute metabolic encephalopathy 20/35/5974   Normocytic anemia 07/27/2021   Chronic back pain 07/27/2021   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[place]) 06/22/2021   Closed fracture of first lumbar vertebra with routine healing 02/03/2021   Closed fracture of multiple ribs 11/18/2020   Anxiety 01/29/2020   Leg pain, bilateral 01/29/2020   Ingrown toenail 07/13/2019   Lumbar spondylosis 05/02/2018   Pain in left knee 03/09/2018   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   Trochanteric bursitis of left hip 01/16/2018   Preventative health care 09/26/2017   HTN (hypertension) 07/19/2015   Hyperlipidemia 07/19/2015   Great toe pain 02/11/2014   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   Obesity (BMI 30-39.9) 06/25/2013   Renal insufficiency 06/25/2013   Weakness of left arm 06/25/2013   Sebaceous cyst 03/03/2011   Sprain of lumbar region 07/31/2010   Rib pain, left 08/29/2009   Carotid artery stenosis, asymptomatic, bilateral 05/02/2009   Eczema, atopic 05/31/2008   Vitamin D deficiency 03/01/2008   BPH (benign prostatic hyperplasia) 08/06/2007   Fasting hyperglycemia 12/21/2006   History of right MCA infarct 2006    ONSET DATE: 07/27/21  REFERRING DIAG: R41.4 (ICD-10-CM) - Hemi-inattention I63.9 (ICD-10-CM) - Cerebrovascular accident (CVA), unspecified mechanism ([place])   THERAPY DIAG:  Attention and concentration deficit  Other lack of coordination  Other symptoms and signs involving cognitive functions following cerebral infarction  Other abnormalities of gait and mobility  History of  falling  PERTINENT HISTORY: L basal ganglia and corona radiata infarct 07/27/21 (had documented AMS for about 5 days prior w/ hospital work-up including MRI negative and symptoms attributed to a UTI); L lower homonymous quadrantanopia. 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 No   OBJECTIVE:   TODAY'S TREATMENT - 01/12/22:  Stacking Blocks Picking up 1" blocks w/ L hand and placing in a stack corresponding to correct color mat with initial cues for use of LUE; pt required less cues throughout for continued L-sided engagement. Moderate verbal cues and assist for problem-solving to avoid knocking over other stacks throughout  Opening Bottle Unscrewing and screwing water bottle for asymmetrical BUE coordination; required frequent gestural and verbal cues to visually attend to task and for termination  of task(perseveration on rotating cap back and forth  after completion). OT incorporated non-slip grip w/ good results.  UB Dressing Practiced donning open front-shirt w/ Min A only for pulling shirt around behind neck; minimal verbal cues as well for sequencing. Unable to complete activity fully as shirt was too small, but pt did demonstrate improved sequencing this session compared to prior attempts.  Folding Clothes Folding button-up shirt in a 3-step sequence to target sequencing and asymmetrical bilateral coordination. OT provided breakdown of task, repetition, verbal cues, and blocking for errorless learning. OT provided modeling and blocked practice of specific steps w/ positive results; success improved w/ repetition.    PATIENT EDUCATION: Ongoing caregiver education, particularly related to cues and facilitation for increased pt participation in functional  activities at home Person educated: Patient and Child(ren) Education method: Explanation Education comprehension: verbalized understanding   GOALS: Goals reviewed with patient? Yes   SHORT TERM GOALS: Target date: 12/29/2021   STG   Status:  1 Pt/caregiver will verbalize understanding of visual perceptual compensatory strategies Baseline: Decreased knowledge of visual deficits and strategies Met - 12/31/21  2 Pt will complete symbol cancellation task w/ at least 50% accuracy using visual compensatory strategies to indicate improved attention Baseline: 40% accuracy w/ multiple errors Met - 11/17/21  3 Pt will participate in bilateral coordination activity w/ no more than 3 cues for incorporation of LUE Baseline: L-sided inattention Met - 12/22/21: completed task w/ no cues for bil integration      LONG TERM GOALS: Target date: 01/13/2022   LTG   Status:  1 Pt will be able to don overhead shirt w/ SPV and verbal/gestural cues prn in at least 1 attempt Baseline: Min A to don/doff shirt sleeve on L side Progressing  2 Pt will be able to thread bottoms on BLEs or don socks w/ Mod A Baseline: Max A for LB dressing Met - 01/07/22   Pt will demonstrate donning bottoms, incorporating use of AE prn, w/ SPV for safety in at least 2 attempts Baseline: Min A to thread BLE on 01/07/22 Initial  3 Pt will demonstrate supine-to-sit transition w/ cues from caregiver and incorporation of AE/DME prn Baseline: Mod-Max A w/ bed mobility Met - 12/03/21; SPV w/ bed ladder and cues  4 Pt will be able to use TV remote (or adaptive remote) to complete 1 task (e.g., turn TV on/of, turn volume up/down) w/ SPV Baseline: Difficulty  Not Progressing - d/c due to continued difficulty and agitation w/ task at home  5 Pt will increase Barthel Index score by at least 5 to indicate improved level of participation in BADLs Baseline: 55/100 Progressing  6 Pt will be able to make a peanut butter and jelly sandwich w/ SPV and  verbal/tactile cues prn Progressing     ASSESSMENT:   CLINICAL IMPRESSION: Pt continues to demonstrate improved awareness of LUE and good carryover of use of LUE within tasks in session. This session, due to difficulty incorporating LUE at gross assist level when using RUE during water bottle task, OT reintroduced familiar task of folding clothes to target asymmetical bilateral coordination specifically. Pt required frequent cues to incorporate LUE; unclear if this is related more to cognition and executive functioning and/or L-sided inattention. OT continued to incorporate assist for errorless learning prn, auditory/verbal/tactile cues, and immediate feedback and knowledge of results throughout based on current EBP for NCD.   PERFORMANCE DEFICITS in functional skills including ADLs, IADLs, coordination, dexterity, Hamilton, GMC, mobility, balance, and vision, cognitive skills including attention, memory, orientation, problem solving, safety  awareness, sequencing, and temperament/personality, and psychosocial skills including environmental adaptation, interpersonal interactions, and routines and behaviors.    IMPAIRMENTS are limiting patient from ADLs, IADLs, rest and sleep, leisure, and social participation.    COMORBIDITIES may have co-morbidities that affects occupational performance. Patient will benefit from skilled OT to address above impairments and improve overall function.     PLAN: OT FREQUENCY: 2x/week   OT DURATION: 10 weeks   PLANNED INTERVENTIONS: self care/ADL training, therapeutic exercise, therapeutic activity, neuromuscular re-education, balance training, functional mobility training, aquatic therapy, splinting, moist heat, cryotherapy, patient/family education, cognitive remediation/compensation, visual/perceptual remediation/compensation, psychosocial skills training, energy conservation, and DME and/or AE instructions   RECOMMENDED OTHER SERVICES: Currently receiving PT services  at this location   CONSULTED AND AGREED WITH PLAN OF CARE: Patient and family member/caregiver   PLAN FOR NEXT SESSION: Preparatory exercise(s) for L-sided attention, proprioception and kinesthetic sense prior to practice of ADL tasks; continue to address bilateral coordination and attention to task   Kathrine Cords, MSOT, OTR/L 01/12/22, 3:52 PM

## 2022-01-12 NOTE — Therapy (Signed)
[place] [place], [name], [address] Phone: [phone]   Fax:  [phone]  Physical Therapy Treatment  Patient Details  Name: William [name] MRN: [id number] Date of Birth: May 04, 1941 Referring Provider (PT): Supple   Encounter Date: 01/12/2022   PT End of Session - 01/12/22 1527     Visit Number 27    Date for PT Re-Evaluation 02/01/22    Authorization Type HTA    PT Start Time 7262    PT Stop Time 1615    PT Time Calculation (min) 45 min    Equipment Utilized During Treatment Gait belt    Activity Tolerance Patient tolerated treatment well    Behavior During Therapy Flat affect;Restless             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   Mild, related to stroke history   Nocturia    Renal insufficiency 06/25/2013   S/P carotid endarterectomy    BILATERAL ICA--  PATENT PER DUPLEX  05-19-2012   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   Right calf   Urinary frequency    Vitamin D deficiency     Past Surgical History:  Procedure Laterality Date   APPENDECTOMY  AS CHILD   CARDIOVASCULAR STRESS TEST  03-27-2012  DR CRENSHAW   LOW RISK LEXISCAN STUDY-- PROBABLE NORMAL PERFUSION AND SOFT TISSUE ATTENUATION/  NO ISCHEMIA/ EF 51%   CAROTID ENDARTERECTOMY Bilateral LEFT  11-12-2008  DR  GREG HAYES   RIGHT ICA  2006  ([facility])   CYSTOSCOPY W/ RETROGRADES Bilateral 06/22/2021   Procedure: CYSTOSCOPY WITH RETROGRADE PYELOGRAM;  Surgeon: Franchot Gallo, MD;  Location: [facility];  Service: Urology;  Laterality: Bilateral;   CYSTOSCOPY WITH LITHOLAPAXY N/A 02/26/2013   Procedure: CYSTOSCOPY WITH LITHOLAPAXY;  Surgeon: Franchot Gallo, MD;  Location: [facility];  Service: Urology;  Laterality: N/A;   EYE SURGERY  Jan. 2016   cataract surgery both eyes   INGUINAL HERNIA REPAIR Right 11-08-2006   IR KYPHO EA ADDL LEVEL THORACIC OR LUMBAR  02/12/2021   IR RADIOLOGIST EVAL & MGMT  02/18/2021   MASS EXCISION N/A 03/03/2016   Procedure: EXCISION OF BACK  MASS;  Surgeon: Stark Klein, MD;  Location: [place];  Service: General;  Laterality: N/A;   MOHS SURGERY Left 1/ 2016   Dr Nevada Crane-- Basal cell   PROSTATE SURGERY     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-C N/A 06/22/2021   Procedure: TRANSURETHRAL RESECTION OF BLADDER TUMOR;  Surgeon: Franchot Gallo, MD;  Location: [facility];  Service: Urology;  Laterality: N/A;   TRANSURETHRAL RESECTION OF PROSTATE N/A 02/26/2013   Procedure: TRANSURETHRAL RESECTION OF THE PROSTATE WITH GYRUS INSTRUMENTS;  Surgeon: Franchot Gallo,  MD;  Location: [place];  Service: Urology;  Laterality: N/A;   TRANSURETHRAL RESECTION OF PROSTATE N/A 06/22/2021   Procedure: TRANSURETHRAL RESECTION OF THE PROSTATE (TURP);  Surgeon: Franchot Gallo, MD;  Location: [facility];  Service: Urology;  Laterality: N/A;    There were no vitals filed for this visit.   Subjective Assessment - 01/12/22 1528     Subjective No falls, no pain, daughter is here with him for a week.    Currently in Pain? No/denies                               [facility] Adult PT Treatment/Exercise - 01/12/22 0001       Ambulation/Gait   Gait Comments stairs  step over step, fast walking in the clinic cues for step length but also for control      High Level Balance   High Level Balance Comments ball kicks, beach ball taps, catches, high toe touches, standing cone reaches both arms, on airex ball catches      Lumbar Exercises: Aerobic   Nustep levewl 5 x 5 minutes      Lumbar Exercises: Machines for Strengthening   Cybex Knee Extension 5# both legs 2x10    Cybex Knee Flexion 25# 2x10      Lumbar Exercises: Standing   Other Standing Lumbar Exercises hip abduction marches and extension 2# 2x10                       PT Short Term Goals - 12/14/21 1451       PT SHORT TERM GOAL #1   Title independent with initial HEP    Time 2    Period Weeks    Status On-going               PT Long Term Goals - 01/07/22 1406       PT LONG TERM GOAL #1   Title Understand fall risks, the importance of exercise and flexibility to health and function    Status Partially Met      PT LONG TERM GOAL #2   Title decrease TUG time to 13 seconds    Status Partially Met      PT LONG TERM GOAL #3   Title walk around our building without rest     Status On-going      PT LONG TERM GOAL #4   Title increase LE strength to 4+/5    Status On-going                   Plan - 01/12/22 1731     Clinical Impression Statement Patient had his daughter here today, we used her to help wtih the higher level balance activities as I could gaurd and she could throw the ball, he dis very well, but once he gets tired he really struggles with picking up feet and then becomes very stooped with his posture    PT Next Visit Plan continue to work with him on function and safety, getting up from floor    Consulted and Agree with Plan of Care Patient             Patient will benefit from skilled therapeutic intervention in order to improve the following deficits and impairments:  Abnormal gait, Decreased coordination, Decreased range of motion,  Difficulty walking, Increased muscle spasms, Cardiopulmonary status limiting activity, Decreased endurance,  Decreased activity tolerance, Pain, Impaired flexibility, Decreased balance, Decreased mobility, Decreased strength, Postural dysfunction, Increased edema  Visit Diagnosis: Other lack of coordination  Other abnormalities of gait and mobility  History of falling  Difficulty in walking, not elsewhere classified  Hemiplegia and hemiparesis following cerebral infarction affecting left non-dominant side (HCC)  Repeated falls  Muscle weakness (generalized)     Problem List Patient Active Problem List   Diagnosis Date Noted   Acute pain of left shoulder 11/12/2021   Leukocytes in urine 11/12/2021   Urinary frequency 11/12/2021   Thrush 10/08/2021   Hemiplegia, dominant side S/P CVA (cerebrovascular accident) ([place]) 09/11/2021   Insomnia    Prediabetes    Chronic kidney disease (CKD), stage IV (severe) (HCC)    Basal ganglia infarction ([place]) 07/29/2021   Transaminitis 07/27/2021   UTI (urinary tract infection) 07/27/2021   CVA (cerebral vascular accident) ([place]) 07/27/2021   Fall at home, initial encounter 07/27/2021   Hyperglycemia 07/27/2021   CKD (chronic kidney disease), stage IV ([place]) 07/27/2021   Cholelithiasis 07/27/2021   Hypoxia 07/27/2021   Nausea and vomiting 69/62/9528   Acute metabolic encephalopathy 41/32/4401   Normocytic anemia 07/27/2021   Chronic back pain 07/27/2021   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[place]) 06/22/2021   Closed fracture of first lumbar vertebra with routine healing 02/03/2021   Closed fracture of multiple ribs 11/18/2020   Anxiety 01/29/2020   Leg pain, bilateral 01/29/2020   Ingrown toenail 07/13/2019   Lumbar spondylosis 05/02/2018   Pain in left knee 03/09/2018   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   Trochanteric bursitis of left hip 01/16/2018   Preventative health care 09/26/2017   HTN (hypertension)  07/19/2015   Hyperlipidemia 07/19/2015   Great toe pain 02/11/2014   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 [place], [name], [address] Phone: [phone]   Fax:  [phone]  Name: William [name] MRN: [id number] Date of Birth: 18-Oct-1940

## 2022-01-13 ENCOUNTER — Encounter: Payer: Self-pay | Admitting: Physical Therapy

## 2022-01-13 ENCOUNTER — Ambulatory Visit: Payer: PPO | Admitting: Physical Therapy

## 2022-01-13 DIAGNOSIS — R41840 Attention and concentration deficit: Secondary | ICD-10-CM | POA: Diagnosis not present

## 2022-01-13 DIAGNOSIS — R262 Difficulty in walking, not elsewhere classified: Secondary | ICD-10-CM

## 2022-01-13 DIAGNOSIS — Z9181 History of falling: Secondary | ICD-10-CM

## 2022-01-13 DIAGNOSIS — R278 Other lack of coordination: Secondary | ICD-10-CM

## 2022-01-13 DIAGNOSIS — R2689 Other abnormalities of gait and mobility: Secondary | ICD-10-CM

## 2022-01-13 DIAGNOSIS — M6281 Muscle weakness (generalized): Secondary | ICD-10-CM

## 2022-01-13 DIAGNOSIS — I69354 Hemiplegia and hemiparesis following cerebral infarction affecting left non-dominant side: Secondary | ICD-10-CM

## 2022-01-13 DIAGNOSIS — R296 Repeated falls: Secondary | ICD-10-CM

## 2022-01-13 NOTE — Therapy (Signed)
[place] [place], [name], [address] Phone: [phone]   Fax:  [phone]  Physical Therapy Treatment  Patient Details  Name: William [name] MRN: [id number] Date of Birth: 10-15-1940 Referring Provider (PT): Supple   Encounter Date: 01/13/2022   PT End of Session - 01/13/22 1451     Visit Number 28    Date for PT Re-Evaluation 02/01/22    Authorization Type HTA    PT Start Time 3846    PT Stop Time 1530    PT Time Calculation (min) 45 min    Equipment Utilized During Treatment Gait belt    Activity Tolerance Patient tolerated treatment well    Behavior During Therapy Flat affect;Restless             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   Mild, related to stroke history   Nocturia    Renal insufficiency 06/25/2013   S/P carotid endarterectomy    BILATERAL ICA--  PATENT PER DUPLEX  05-19-2012   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   Right calf   Urinary frequency    Vitamin D deficiency     Past Surgical History:  Procedure Laterality Date   APPENDECTOMY  AS CHILD   CARDIOVASCULAR STRESS TEST  03-27-2012  DR CRENSHAW   LOW RISK LEXISCAN STUDY-- PROBABLE NORMAL PERFUSION AND SOFT TISSUE ATTENUATION/  NO ISCHEMIA/ EF 51%   CAROTID ENDARTERECTOMY Bilateral LEFT  11-12-2008  DR  GREG HAYES   RIGHT ICA  2006  ([facility])   CYSTOSCOPY W/ RETROGRADES Bilateral 06/22/2021   Procedure: CYSTOSCOPY WITH RETROGRADE PYELOGRAM;  Surgeon: Franchot Gallo, MD;  Location: [facility];  Service: Urology;  Laterality: Bilateral;   CYSTOSCOPY WITH LITHOLAPAXY N/A 02/26/2013   Procedure: CYSTOSCOPY WITH LITHOLAPAXY;  Surgeon: Franchot Gallo, MD;  Location: [facility];  Service: Urology;  Laterality: N/A;   EYE SURGERY  Jan. 2016   cataract surgery both eyes   INGUINAL HERNIA REPAIR Right 11-08-2006   IR KYPHO EA ADDL LEVEL THORACIC OR LUMBAR  02/12/2021   IR RADIOLOGIST EVAL & MGMT  02/18/2021   MASS EXCISION N/A 03/03/2016   Procedure: EXCISION OF BACK  MASS;  Surgeon: Stark Klein, MD;  Location: [place];  Service: General;  Laterality: N/A;   MOHS SURGERY Left 1/ 2016   Dr Nevada Crane-- Basal cell   PROSTATE SURGERY     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-C N/A 06/22/2021   Procedure: TRANSURETHRAL RESECTION OF BLADDER TUMOR;  Surgeon: Franchot Gallo, MD;  Location: [facility];  Service: Urology;  Laterality: N/A;   TRANSURETHRAL RESECTION OF PROSTATE N/A 02/26/2013   Procedure: TRANSURETHRAL RESECTION OF THE PROSTATE WITH GYRUS INSTRUMENTS;  Surgeon: Franchot Gallo,  MD;  Location: [place];  Service: Urology;  Laterality: N/A;   TRANSURETHRAL RESECTION OF PROSTATE N/A 06/22/2021   Procedure: TRANSURETHRAL RESECTION OF THE PROSTATE (TURP);  Surgeon: Franchot Gallo, MD;  Location: [facility];  Service: Urology;  Laterality: N/A;    There were no vitals filed for this visit.   Subjective Assessment - 01/13/22 1451     Subjective Doing okay, no issues    Currently in Pain? No/denies                [facility] PT Assessment - 01/13/22 0001       Timed Up and Go Test   Normal TUG (seconds) 11                           [facility] Adult PT  Treatment/Exercise - 01/13/22 0001       Transfers   Comments worked on bed mobility, rolling, getting up and down from supine, side scooting      Ambulation/Gait   Gait Comments outside with light CGA up curb, uneven terrain x 2      High Level Balance   High Level Balance Comments ball toss, [place] ball [place] ball, side stepping over objects, side step on and off 4" step, negotiating over and around obstacles, reaching for objects with calling out what hand and what color      Lumbar Exercises: Aerobic   Nustep levewl 5 x 5 minutes      Lumbar Exercises: Supine   Other Supine Lumbar Exercises ab work, partial sit ups                       PT Short Term Goals - 12/14/21 1451       PT SHORT TERM GOAL #1   Title independent with initial HEP    Time 2    Period Weeks    Status On-going               PT Long Term Goals - 01/13/22 1452       PT LONG TERM GOAL #1   Title Understand fall risks, the importance of exercise and flexibility to health and function    Status Achieved                   Plan - 01/13/22 1548     Clinical Impression Statement Patient really struggles with attention at times, especially with a higher level balance activity, needs a lot of cues for side stepping, tends to not take a large step and has some difficulty following instrucions fully.  Did better with bed mobility and the TUG was much faster    PT Next Visit Plan continue to work with him on function and safety, getting up from floor    Consulted and Agree with Plan of Care Patient             Patient will benefit from skilled therapeutic intervention in order to improve the following deficits and impairments:  Abnormal gait, Decreased coordination, Decreased range of motion, Difficulty walking, Increased muscle spasms, Cardiopulmonary status limiting activity, Decreased endurance, Decreased activity tolerance, Pain, Impaired flexibility, Decreased balance,  Decreased mobility, Decreased strength, Postural dysfunction, Increased edema  Visit Diagnosis: Other lack of coordination  Other abnormalities of gait and mobility  History of falling  Difficulty in walking, not elsewhere classified  Hemiplegia and hemiparesis following cerebral infarction affecting left  non-dominant side ([place])  Repeated falls  Muscle weakness (generalized)     Problem List Patient Active Problem List   Diagnosis Date Noted   Acute pain of left shoulder 11/12/2021   Leukocytes in urine 11/12/2021   Urinary frequency 11/12/2021   Thrush 10/08/2021   Hemiplegia, dominant side S/P CVA (cerebrovascular accident) ([place]) 09/11/2021   Insomnia    Prediabetes    Chronic kidney disease (CKD), stage IV (severe) ([place])    Basal ganglia infarction ([place]) 07/29/2021   Transaminitis 07/27/2021   UTI (urinary tract infection) 07/27/2021   CVA (cerebral vascular accident) ([place]) 07/27/2021   Fall at home, initial encounter 07/27/2021   Hyperglycemia 07/27/2021   CKD (chronic kidney disease), stage IV ([place]) 07/27/2021   Cholelithiasis 07/27/2021   Hypoxia 07/27/2021   Nausea and vomiting 97/35/3299   Acute metabolic encephalopathy 24/26/8341   Normocytic anemia 07/27/2021   Chronic back pain 07/27/2021   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[place]) 06/22/2021   Closed fracture of first lumbar vertebra with routine healing 02/03/2021   Closed fracture of multiple ribs 11/18/2020   Anxiety 01/29/2020   Leg pain, bilateral 01/29/2020   Ingrown toenail 07/13/2019   Lumbar spondylosis 05/02/2018   Pain in left knee 03/09/2018   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   Trochanteric bursitis of left hip 01/16/2018   Preventative health care 09/26/2017   HTN (hypertension) 07/19/2015   Hyperlipidemia 07/19/2015   Great toe pain 02/11/2014   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 [place], [name], [address] Phone: [phone]   Fax:  [phone]  Name: GIANG HEMME MRN: [id number] Date of Birth: 23-Apr-1941

## 2022-01-14 ENCOUNTER — Encounter: Payer: Self-pay | Admitting: Physical Therapy

## 2022-01-14 ENCOUNTER — Encounter: Payer: Self-pay | Admitting: Occupational Therapy

## 2022-01-14 ENCOUNTER — Ambulatory Visit: Payer: PPO | Admitting: Occupational Therapy

## 2022-01-14 ENCOUNTER — Ambulatory Visit: Payer: PPO | Admitting: Physical Therapy

## 2022-01-14 DIAGNOSIS — Z9181 History of falling: Secondary | ICD-10-CM

## 2022-01-14 DIAGNOSIS — M25512 Pain in left shoulder: Secondary | ICD-10-CM

## 2022-01-14 DIAGNOSIS — R2689 Other abnormalities of gait and mobility: Secondary | ICD-10-CM

## 2022-01-14 DIAGNOSIS — R278 Other lack of coordination: Secondary | ICD-10-CM

## 2022-01-14 DIAGNOSIS — M6281 Muscle weakness (generalized): Secondary | ICD-10-CM

## 2022-01-14 DIAGNOSIS — R41840 Attention and concentration deficit: Secondary | ICD-10-CM

## 2022-01-14 DIAGNOSIS — R262 Difficulty in walking, not elsewhere classified: Secondary | ICD-10-CM

## 2022-01-14 DIAGNOSIS — R296 Repeated falls: Secondary | ICD-10-CM

## 2022-01-14 DIAGNOSIS — I69354 Hemiplegia and hemiparesis following cerebral infarction affecting left non-dominant side: Secondary | ICD-10-CM

## 2022-01-14 DIAGNOSIS — I69318 Other symptoms and signs involving cognitive functions following cerebral infarction: Secondary | ICD-10-CM

## 2022-01-14 NOTE — Therapy (Signed)
OUTPATIENT OCCUPATIONAL THERAPY TREATMENT NOTE   Patient Name: William [name] MRN: [id number] DOB:[birth year], 81 y.o., male Today's Date: 01/14/2022  PCP: Roma Schanz, DO REFERRING PROVIDER: Carollee Herter, Alferd Apa, DO   OT End of Session - 01/14/22 1458     Visit Number 24    Number of Visits 31    Date for OT Re-Evaluation 02/12/22    Authorization Type [employer]    Authorization Time Period VL: [place]    Progress Note Due on Visit 30    OT Start Time 1446    OT Stop Time 1528    OT Time Calculation (min) 42 min    Activity Tolerance Patient tolerated treatment well    Behavior During Therapy Flat affect;Restless            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   Mild, related to stroke history   Nocturia    Renal insufficiency 06/25/2013   S/P carotid endarterectomy    BILATERAL ICA--  PATENT PER DUPLEX  05-19-2012   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   Right calf   Urinary frequency    Vitamin D deficiency    Past Surgical History:  Procedure Laterality Date   APPENDECTOMY  AS CHILD   CARDIOVASCULAR STRESS TEST  03-27-2012  DR CRENSHAW   LOW RISK LEXISCAN STUDY-- PROBABLE NORMAL PERFUSION AND SOFT TISSUE ATTENUATION/  NO ISCHEMIA/ EF 51%   CAROTID ENDARTERECTOMY Bilateral LEFT  11-12-2008  DR GREG HAYES   RIGHT ICA  2006   ([facility])   CYSTOSCOPY W/ RETROGRADES Bilateral 06/22/2021   Procedure: CYSTOSCOPY WITH RETROGRADE PYELOGRAM;  Surgeon: Franchot Gallo, MD;  Location: [facility];  Service: Urology;  Laterality: Bilateral;   CYSTOSCOPY WITH LITHOLAPAXY N/A 02/26/2013   Procedure: CYSTOSCOPY WITH LITHOLAPAXY;  Surgeon: Franchot Gallo, MD;  Location: [facility];  Service: Urology;  Laterality: N/A;   EYE SURGERY  Jan. 2016   cataract surgery both eyes   INGUINAL HERNIA REPAIR Right 11-08-2006   IR KYPHO EA ADDL LEVEL THORACIC OR LUMBAR  02/12/2021   IR RADIOLOGIST EVAL & MGMT  02/18/2021   MASS EXCISION N/A 03/03/2016   Procedure: EXCISION OF BACK  MASS;  Surgeon: Stark Klein, MD;  Location: [place];  Service: General;  Laterality: N/A;   MOHS SURGERY Left 1/ 2016   Dr Nevada Crane-- Basal cell   PROSTATE SURGERY     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-C N/A 06/22/2021   Procedure: TRANSURETHRAL RESECTION OF BLADDER TUMOR;  Surgeon: Franchot Gallo, MD;  Location: [facility];  Service: Urology;  Laterality: N/A;   TRANSURETHRAL RESECTION OF PROSTATE N/A 02/26/2013   Procedure: TRANSURETHRAL RESECTION OF THE PROSTATE WITH GYRUS INSTRUMENTS;  Surgeon: Franchot Gallo, MD;  Location: [place];  Service: Urology;  Laterality: N/A;   TRANSURETHRAL RESECTION OF PROSTATE N/A 06/22/2021   Procedure: TRANSURETHRAL RESECTION OF THE PROSTATE (TURP);  Surgeon: Franchot Gallo, MD;  Location: [facility];  Service: Urology;  Laterality: N/A;   Patient Active Problem List   Diagnosis Date Noted   Acute pain of left shoulder 11/12/2021   Leukocytes in urine 11/12/2021   Urinary frequency 11/12/2021   Thrush 10/08/2021   Hemiplegia, dominant side S/P CVA (cerebrovascular accident) ([place]) 09/11/2021   Insomnia    Prediabetes    Chronic kidney disease (CKD), stage IV (severe) ([place])    Basal ganglia infarction  ([place]) 07/29/2021   Transaminitis 07/27/2021   UTI (urinary tract infection) 07/27/2021   CVA (cerebral vascular accident) ([place]) 07/27/2021   Fall at home, initial encounter 07/27/2021   Hyperglycemia 07/27/2021   CKD (chronic kidney disease), stage IV ([place]) 07/27/2021   Cholelithiasis 07/27/2021   Hypoxia 07/27/2021   Nausea and vomiting 22/07/5425   Acute metabolic encephalopathy 12/05/7626   Normocytic anemia 07/27/2021   Chronic back pain 07/27/2021   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[place]) 06/22/2021   Closed fracture of first lumbar vertebra with routine healing 02/03/2021   Closed fracture of multiple ribs 11/18/2020   Anxiety 01/29/2020   Leg pain, bilateral 01/29/2020   Ingrown toenail 07/13/2019   Lumbar spondylosis 05/02/2018   Pain in left knee 03/09/2018   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   Trochanteric bursitis of left hip 01/16/2018   Preventative health care 09/26/2017   HTN (hypertension) 07/19/2015   Hyperlipidemia 07/19/2015   Great toe pain 02/11/2014   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   Obesity (BMI 30-39.9) 06/25/2013   Renal insufficiency 06/25/2013   Weakness of left arm 06/25/2013   Sebaceous cyst 03/03/2011   Sprain of lumbar region 07/31/2010   Rib pain, left 08/29/2009   Carotid artery stenosis, asymptomatic, bilateral 05/02/2009   Eczema, atopic 05/31/2008   Vitamin D deficiency 03/01/2008   BPH (benign prostatic hyperplasia) 08/06/2007   Fasting hyperglycemia 12/21/2006   History of right MCA infarct 2006    ONSET DATE: 07/27/21  REFERRING DIAG: R41.4 (ICD-10-CM) - Hemi-inattention I63.9 (ICD-10-CM) - Cerebrovascular accident (CVA), unspecified mechanism ([place])   THERAPY DIAG:  Attention and concentration deficit  Other lack of coordination  Other symptoms and signs involving cognitive functions following cerebral infarction  Other abnormalities of gait and mobility  History of  falling  PERTINENT HISTORY: L basal ganglia and corona radiata infarct 07/27/21 (had documented AMS for about 5 days prior w/ hospital work-up including MRI negative and symptoms attributed to a UTI); L lower homonymous quadrantanopia. Other PMH includes R MCA infarct in 2006 w/ L-sided hemiparesis, NCD, carotid artery disease (bilateral), HTN, HLD,CKD3, and hx of bladder cancer  PRECAUTIONS: Fall risk  SUBJECTIVE:   SUBJECTIVE ASSESSMENT: Pt reported he enjoyed working on the puzzle  PAIN: Are you having pain? Yes: NPRS scale: 4/10 Pain location: lower, middle back Pain description: achy Aggravating factors: exercises Relieving factors: nothing   OBJECTIVE:   TODAY'S TREATMENT - 01/14/22:  Exercise Ball Sitting on large therapy ball, focusing on body awareness, balance and righting reactions, and core strength; OT had pt maintain balance in midline, releasing UE support from // bars for short periods of time. Pt required moderate verbal, tactile, and gestural cues and encouragement to release UE support each rep.  Stacking Blocks Picking up 1" blocks and placing in a stack corresponding to  the color called out using ClockYourself app. Pt completed 1st set w/ R hand blocks at 30 bpm; initially slowed down to facilitate understanding. Completed 2nd set w/ L hand at 10 bpm, requiring consistent verbal/tactile cues to decrease compensating w/ RUE. Activity targeting processing, GMC/coordination, alternating attention, sequencing, and visual perception.  Puzzle Puzzle used to facilitate bilateral coordination, pacing, and visual perception. Pt able to complete 8 interior pieces of 24-piece puzzle w/ Mod A; OT incorporated scaffolding by completing puzzle border prior to pt engagement. Pt required verbal cues to correct errors, breakdown of task for spatial orientation, and HOHA to incorporate LUE. Also required max cues and occasional physical assist to pull pieces back apart using both hands  when deconstructing puzzle after completing activity.    PATIENT EDUCATION: Ongoing caregiver education, particularly related to cues and facilitation for increased pt participation in functional activities at home Person educated: Patient and Child(ren) Education method: Explanation Education comprehension: verbalized understanding   GOALS: Goals reviewed with patient? Yes   SHORT TERM GOALS: Target date: 12/29/2021   STG   Status:  1 Pt/caregiver will verbalize understanding of visual perceptual compensatory strategies Baseline: Decreased knowledge of visual deficits and strategies Met - 12/31/21  2 Pt will complete symbol cancellation task w/ at least 50% accuracy using visual compensatory strategies to indicate improved attention Baseline: 40% accuracy w/ multiple errors Met - 11/17/21  3 Pt will participate in bilateral coordination activity w/ no more than 3 cues for incorporation of LUE Baseline: L-sided inattention Met - 12/22/21: completed task w/ no cues for bil integration      LONG TERM GOALS: Target date: 01/13/2022   LTG   Status:  1 Pt will be able to don overhead shirt w/ SPV and verbal/gestural cues prn in at least 1 attempt Baseline: Min A to don/doff shirt sleeve on L side Progressing  2 Pt will be able to thread bottoms on BLEs or don socks w/ Mod A Baseline: Max A for LB dressing Met - 01/07/22   Pt will demonstrate donning bottoms, incorporating use of AE prn, w/ SPV for safety in at least 2 attempts Baseline: Min A to thread BLE on 01/07/22 Progressing  3 Pt will demonstrate supine-to-sit transition w/ cues from caregiver and incorporation of AE/DME prn Baseline: Mod-Max A w/ bed mobility Met - 12/03/21; SPV w/ bed ladder and cues  4 Pt will be able to use TV remote (or adaptive remote) to complete 1 task (e.g., turn TV on/of, turn volume up/down) w/ SPV Baseline: Difficulty  Not Progressing - d/c due to continued difficulty and agitation w/ task at home  5 Pt will  increase Barthel Index score by at least 5 to indicate improved level of participation in BADLs Baseline: 55/100 Progressing  6 Pt will be able to make a peanut butter and jelly sandwich w/ SPV and verbal/tactile cues prn Progressing     ASSESSMENT:   CLINICAL IMPRESSION: OT continued to focus on L-sided attention and use of LUE, BUE symmetrical and asymmetrical coordination, sequencing, and visual perception. Pt demonstrates most difficulty incorporating LUE simultaneously w/ RUE, likely due to inattention, and will benefit from continued focus on bilateral integration. Limitations w/ problem-solving and executive function were also apparent, but success with tasks did improve w/ increased repetition and duration during both ClockYourself app activity w/ blocks and puzzle. Pt was able able to complete donning of open-front shirt w/ daughter providing Min A and verbal cues for sequencing. OT continued to incorporate assist  for errorless learning prn, auditory/verbal/tactile cues, and immediate feedback and knowledge of results throughout based on current EBP for NCD.   PERFORMANCE DEFICITS in functional skills including ADLs, IADLs, coordination, dexterity, [place], GMC, mobility, balance, and vision, cognitive skills including attention, memory, orientation, problem solving, safety awareness, sequencing, and temperament/personality, and psychosocial skills including environmental adaptation, interpersonal interactions, and routines and behaviors.    IMPAIRMENTS are limiting patient from ADLs, IADLs, rest and sleep, leisure, and social participation.    COMORBIDITIES may have co-morbidities that affects occupational performance. Patient will benefit from skilled OT to address above impairments and improve overall function.     PLAN: OT FREQUENCY: 2x/week   OT DURATION: 10 weeks   PLANNED INTERVENTIONS: self care/ADL training, therapeutic exercise, therapeutic activity, neuromuscular re-education,  balance training, functional mobility training, aquatic therapy, splinting, moist heat, cryotherapy, patient/family education, cognitive remediation/compensation, visual/perceptual remediation/compensation, psychosocial skills training, energy conservation, and DME and/or AE instructions   RECOMMENDED OTHER SERVICES: Currently receiving PT services at this location   CONSULTED AND AGREED WITH PLAN OF CARE: Patient and family member/caregiver   PLAN FOR NEXT SESSION: Preparatory exercise(s) for L-sided attention, proprioception and kinesthetic sense prior to practice of ADL tasks; continue to address bilateral coordination and attention to task   Kathrine Cords, MSOT, OTR/L 01/14/22, 3:29 PM

## 2022-01-14 NOTE — Therapy (Signed)
[place]. [place], [name], [address] Phone: [phone]   Fax:  [phone]  Physical Therapy Treatment  Patient Details  Name: LORNE WINKELS MRN: [id number] Date of Birth: 1941-01-17 Referring Provider (PT): Supple   Encounter Date: 01/14/2022   PT End of Session - 01/14/22 1516     Visit Number 29    Date for PT Re-Evaluation 02/01/22    Authorization Type HTA    PT Start Time 1530    PT Stop Time 1615    PT Time Calculation (min) 45 min    Equipment Utilized During Treatment Gait belt    Activity Tolerance Patient tolerated treatment well    Behavior During Therapy [facility] for tasks assessed/performed             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   Mild, related to stroke history   Nocturia    Renal insufficiency 06/25/2013   S/P carotid endarterectomy    BILATERAL ICA--  PATENT PER DUPLEX  05-19-2012   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   Right calf   Urinary frequency    Vitamin D deficiency     Past Surgical History:  Procedure Laterality Date   APPENDECTOMY  AS CHILD   CARDIOVASCULAR STRESS TEST  03-27-2012  DR CRENSHAW   LOW RISK LEXISCAN STUDY-- PROBABLE NORMAL PERFUSION AND SOFT TISSUE ATTENUATION/  NO ISCHEMIA/ EF 51%   CAROTID ENDARTERECTOMY Bilateral LEFT   11-12-2008  DR GREG HAYES   RIGHT ICA  2006  ([facility])   CYSTOSCOPY W/ RETROGRADES Bilateral 06/22/2021   Procedure: CYSTOSCOPY WITH RETROGRADE PYELOGRAM;  Surgeon: Franchot Gallo, MD;  Location: [facility];  Service: Urology;  Laterality: Bilateral;   CYSTOSCOPY WITH LITHOLAPAXY N/A 02/26/2013   Procedure: CYSTOSCOPY WITH LITHOLAPAXY;  Surgeon: Franchot Gallo, MD;  Location: [facility];  Service: Urology;  Laterality: N/A;   EYE SURGERY  Jan. 2016   cataract surgery both eyes   INGUINAL HERNIA REPAIR Right 11-08-2006   IR KYPHO EA ADDL LEVEL THORACIC OR LUMBAR  02/12/2021   IR RADIOLOGIST EVAL & MGMT  02/18/2021   MASS EXCISION N/A 03/03/2016   Procedure: EXCISION OF BACK  MASS;  Surgeon: Stark Klein, MD;  Location: [facility];  Service: General;  Laterality: N/A;   MOHS SURGERY Left 1/ 2016   Dr Nevada Crane-- Basal cell   PROSTATE SURGERY     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-C N/A 06/22/2021   Procedure: TRANSURETHRAL RESECTION OF BLADDER TUMOR;  Surgeon: Franchot Gallo, MD;  Location: [facility];  Service: Urology;  Laterality: N/A;   TRANSURETHRAL RESECTION OF PROSTATE N/A 02/26/2013   Procedure: TRANSURETHRAL RESECTION OF THE PROSTATE WITH GYRUS INSTRUMENTS;  Surgeon:  Franchot Gallo, MD;  Location: [facility];  Service: Urology;  Laterality: N/A;   TRANSURETHRAL RESECTION OF PROSTATE N/A 06/22/2021   Procedure: TRANSURETHRAL RESECTION OF THE PROSTATE (TURP);  Surgeon: Franchot Gallo, MD;  Location: [facility];  Service: Urology;  Laterality: N/A;    There were no vitals filed for this visit.   Subjective Assessment - 01/14/22 1517     Subjective back is hurting today, he thinks it was from the core work we did yesterday    Currently in Pain? Yes    Pain Score 4     Pain Location Back    Pain Orientation Lower    Pain Descriptors / Indicators Sore    Pain  Relieving Factors heat                               [facility] Adult PT Treatment/Exercise - 01/14/22 0001       Ambulation/Gait   Gait Comments fast walking      High Level Balance   High Level Balance Comments ball toss, [place] ball [place] ball, side stepping over objects, side step on and off 4" step, negotiating over and around obstacles, reaching for objects with calling out what hand and what color, walking ball toss, side stepping ball toss, on firm airex ball toss, softer airex ball toss      Lumbar Exercises: Aerobic   Nustep levewl 5 x 5 minutes      Lumbar Exercises: Machines for Strengthening   Cybex Knee Extension 5# both legs 2x10    Cybex Knee Flexion 25# 2x10    Other Lumbar Machine Exercise 5# resisted gait fwd and backward                       PT Short Term Goals - 12/14/21 1451       PT SHORT TERM GOAL #1   Title independent with initial HEP    Time 2    Period Weeks    Status On-going               PT Long Term Goals - 01/13/22 1452       PT LONG TERM GOAL #1   Title Understand fall risks, the importance of exercise and flexibility to health and function    Status Achieved                   Plan - 01/14/22 1619     Clinical Impression Statement Did well today, has some inattention today but the gym was busier, He did have some back pain but did well and no increase of pain, I added the walking ball toss and some activity on the airex and the resisted gait, a lot of cues for the newer stuff    PT Next Visit Plan continue to work with him on function and safety, getting up from floor    Consulted and Agree with Plan of Care Patient             Patient will benefit from skilled therapeutic intervention in order to improve the following deficits and impairments:  Abnormal gait, Decreased coordination, Decreased range of motion, Difficulty walking, Increased muscle spasms, Cardiopulmonary status limiting  activity, Decreased endurance, Decreased activity tolerance, Pain, Impaired flexibility, Decreased balance, Decreased mobility, Decreased strength, Postural dysfunction, Increased edema  Visit Diagnosis: Other lack of coordination  Other abnormalities of gait  and mobility  History of falling  Difficulty in walking, not elsewhere classified  Hemiplegia and hemiparesis following cerebral infarction affecting left non-dominant side (HCC)  Repeated falls  Muscle weakness (generalized)  Acute pain of left shoulder     Problem List Patient Active Problem List   Diagnosis Date Noted   Acute pain of left shoulder 11/12/2021   Leukocytes in urine 11/12/2021   Urinary frequency 11/12/2021   Thrush 10/08/2021   Hemiplegia, dominant side S/P CVA (cerebrovascular accident) ([place]) 09/11/2021   Insomnia    Prediabetes    Chronic kidney disease (CKD), stage IV (severe) ([place])    Basal ganglia infarction ([place]) 07/29/2021   Transaminitis 07/27/2021   UTI (urinary tract infection) 07/27/2021   CVA (cerebral vascular accident) ([place]) 07/27/2021   Fall at home, initial encounter 07/27/2021   Hyperglycemia 07/27/2021   CKD (chronic kidney disease), stage IV ([place]) 07/27/2021   Cholelithiasis 07/27/2021   Hypoxia 07/27/2021   Nausea and vomiting 70/06/7492   Acute metabolic encephalopathy 49/67/5916   Normocytic anemia 07/27/2021   Chronic back pain 07/27/2021   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[place]) 06/22/2021   Closed fracture of first lumbar vertebra with routine healing 02/03/2021   Closed fracture of multiple ribs 11/18/2020   Anxiety 01/29/2020   Leg pain, bilateral 01/29/2020   Ingrown toenail 07/13/2019   Lumbar spondylosis 05/02/2018   Pain in left knee 03/09/2018   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   Trochanteric bursitis of left hip 01/16/2018   Preventative health care 09/26/2017   HTN (hypertension) 07/19/2015   Hyperlipidemia 07/19/2015   Great toe  pain 02/11/2014   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 [place], [name], [address] Phone: [phone]   Fax:  [phone]  Name: SENAN UREY MRN: [id number] Date of Birth: 04/24/1941

## 2022-01-18 DIAGNOSIS — N184 Chronic kidney disease, stage 4 (severe): Secondary | ICD-10-CM | POA: Diagnosis not present

## 2022-01-18 DIAGNOSIS — M47816 Spondylosis without myelopathy or radiculopathy, lumbar region: Secondary | ICD-10-CM | POA: Diagnosis not present

## 2022-01-18 DIAGNOSIS — S2249XA Multiple fractures of ribs, unspecified side, initial encounter for closed fracture: Secondary | ICD-10-CM | POA: Diagnosis not present

## 2022-01-18 DIAGNOSIS — G9341 Metabolic encephalopathy: Secondary | ICD-10-CM | POA: Diagnosis not present

## 2022-01-18 DIAGNOSIS — G20 Parkinson's disease: Secondary | ICD-10-CM | POA: Diagnosis not present

## 2022-01-18 DIAGNOSIS — I6381 Other cerebral infarction due to occlusion or stenosis of small artery: Secondary | ICD-10-CM | POA: Diagnosis not present

## 2022-01-19 ENCOUNTER — Ambulatory Visit: Payer: PPO | Admitting: Occupational Therapy

## 2022-01-19 ENCOUNTER — Encounter: Payer: Self-pay | Admitting: Physical Therapy

## 2022-01-19 ENCOUNTER — Encounter: Payer: Self-pay | Admitting: Occupational Therapy

## 2022-01-19 ENCOUNTER — Ambulatory Visit: Payer: PPO | Admitting: Physical Therapy

## 2022-01-19 DIAGNOSIS — I69318 Other symptoms and signs involving cognitive functions following cerebral infarction: Secondary | ICD-10-CM

## 2022-01-19 DIAGNOSIS — R262 Difficulty in walking, not elsewhere classified: Secondary | ICD-10-CM

## 2022-01-19 DIAGNOSIS — N184 Chronic kidney disease, stage 4 (severe): Secondary | ICD-10-CM | POA: Diagnosis not present

## 2022-01-19 DIAGNOSIS — I69354 Hemiplegia and hemiparesis following cerebral infarction affecting left non-dominant side: Secondary | ICD-10-CM

## 2022-01-19 DIAGNOSIS — G9341 Metabolic encephalopathy: Secondary | ICD-10-CM | POA: Diagnosis not present

## 2022-01-19 DIAGNOSIS — Z9181 History of falling: Secondary | ICD-10-CM

## 2022-01-19 DIAGNOSIS — M47816 Spondylosis without myelopathy or radiculopathy, lumbar region: Secondary | ICD-10-CM | POA: Diagnosis not present

## 2022-01-19 DIAGNOSIS — R41840 Attention and concentration deficit: Secondary | ICD-10-CM | POA: Diagnosis not present

## 2022-01-19 DIAGNOSIS — G20 Parkinson's disease: Secondary | ICD-10-CM | POA: Diagnosis not present

## 2022-01-19 DIAGNOSIS — I6381 Other cerebral infarction due to occlusion or stenosis of small artery: Secondary | ICD-10-CM | POA: Diagnosis not present

## 2022-01-19 DIAGNOSIS — R278 Other lack of coordination: Secondary | ICD-10-CM

## 2022-01-19 DIAGNOSIS — R2689 Other abnormalities of gait and mobility: Secondary | ICD-10-CM

## 2022-01-19 DIAGNOSIS — M6281 Muscle weakness (generalized): Secondary | ICD-10-CM

## 2022-01-19 DIAGNOSIS — R296 Repeated falls: Secondary | ICD-10-CM

## 2022-01-19 DIAGNOSIS — S2249XA Multiple fractures of ribs, unspecified side, initial encounter for closed fracture: Secondary | ICD-10-CM | POA: Diagnosis not present

## 2022-01-19 NOTE — Therapy (Signed)
OUTPATIENT OCCUPATIONAL THERAPY TREATMENT NOTE   Patient Name: William [name] MRN: [id number] DOB:04/23/1941, 81 y.o., male Today's Date: 01/19/2022  PCP: Roma Schanz, DO REFERRING PROVIDER: Carollee Herter, Alferd Apa, DO   OT End of Session - 01/19/22 1451     Visit Number 25    Number of Visits 31    Date for OT Re-Evaluation 02/12/22    Authorization Type [employer]    Authorization Time Period VL: [place]    Progress Note Due on Visit 30    OT Start Time 1445    OT Stop Time 1533    OT Time Calculation (min) 48 min    Activity Tolerance Patient tolerated treatment well    Behavior During Therapy Flat affect;Restless            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   Mild, related to stroke history   Nocturia    Renal insufficiency 06/25/2013   S/P carotid endarterectomy    BILATERAL ICA--  PATENT PER DUPLEX  05-19-2012   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   Right calf   Urinary frequency    Vitamin D deficiency    Past Surgical History:  Procedure Laterality Date   APPENDECTOMY  AS CHILD   CARDIOVASCULAR STRESS TEST  03-27-2012  DR CRENSHAW   LOW RISK LEXISCAN STUDY-- PROBABLE NORMAL PERFUSION AND SOFT TISSUE ATTENUATION/  NO ISCHEMIA/ EF 51%   CAROTID ENDARTERECTOMY Bilateral LEFT  11-12-2008  DR GREG HAYES   RIGHT ICA  2006   ([facility])   CYSTOSCOPY W/ RETROGRADES Bilateral 06/22/2021   Procedure: CYSTOSCOPY WITH RETROGRADE PYELOGRAM;  Surgeon: Franchot Gallo, MD;  Location: [facility];  Service: Urology;  Laterality: Bilateral;   CYSTOSCOPY WITH LITHOLAPAXY N/A 02/26/2013   Procedure: CYSTOSCOPY WITH LITHOLAPAXY;  Surgeon: Franchot Gallo, MD;  [facility];  Service: Urology;  Laterality: N/A;   EYE SURGERY  Jan. 2016   cataract surgery both eyes   INGUINAL HERNIA REPAIR Right 11-08-2006   IR KYPHO EA ADDL LEVEL THORACIC OR LUMBAR  02/12/2021   IR RADIOLOGIST EVAL & MGMT  02/18/2021   MASS EXCISION N/A 03/03/2016   Procedure: EXCISION OF BACK  MASS;  Surgeon: Stark Klein, MD;  Location: [place];  Service: General;  Laterality: N/A;   MOHS SURGERY Left 1/ 2016   Dr Nevada Crane-- Basal cell   PROSTATE SURGERY     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-C N/A 06/22/2021   Procedure: TRANSURETHRAL RESECTION OF BLADDER TUMOR;  Surgeon: Franchot Gallo, MD;  Location: [facility];  Service: Urology;  Laterality: N/A;   TRANSURETHRAL RESECTION OF PROSTATE N/A 02/26/2013   Procedure: TRANSURETHRAL RESECTION OF THE PROSTATE WITH GYRUS INSTRUMENTS;  Surgeon: Franchot Gallo, MD;  Location: [facility];  Service: Urology;  Laterality: N/A;   TRANSURETHRAL RESECTION OF PROSTATE N/A 06/22/2021   Procedure: TRANSURETHRAL RESECTION OF THE PROSTATE (TURP);  Surgeon: Franchot Gallo, MD;  Location: [facility];  Service: Urology;  Laterality: N/A;   Patient Active Problem List   Diagnosis Date Noted   Acute pain of left shoulder 11/12/2021   Leukocytes in urine 11/12/2021   Urinary frequency 11/12/2021   Thrush 10/08/2021   Hemiplegia, dominant side S/P CVA (cerebrovascular accident) ([place]) 09/11/2021   Insomnia    Prediabetes    Chronic kidney disease (CKD), stage IV (severe) ([place])    Basal ganglia infarction  ([place]) 07/29/2021   Transaminitis 07/27/2021   UTI (urinary tract infection) 07/27/2021   CVA (cerebral vascular accident) ([place]) 07/27/2021   Fall at home, initial encounter 07/27/2021   Hyperglycemia 07/27/2021   CKD (chronic kidney disease), stage IV ([place]) 07/27/2021   Cholelithiasis 07/27/2021   Hypoxia 07/27/2021   Nausea and vomiting 99/83/3825   Acute metabolic encephalopathy 05/39/7673   Normocytic anemia 07/27/2021   Chronic back pain 07/27/2021   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[place]) 06/22/2021   Closed fracture of first lumbar vertebra with routine healing 02/03/2021   Closed fracture of multiple ribs 11/18/2020   Anxiety 01/29/2020   Leg pain, bilateral 01/29/2020   Ingrown toenail 07/13/2019   Lumbar spondylosis 05/02/2018   Pain in left knee 03/09/2018   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   Trochanteric bursitis of left hip 01/16/2018   Preventative health care 09/26/2017   HTN (hypertension) 07/19/2015   Hyperlipidemia 07/19/2015   Great toe pain 02/11/2014   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   Obesity (BMI 30-39.9) 06/25/2013   Renal insufficiency 06/25/2013   Weakness of left arm 06/25/2013   Sebaceous cyst 03/03/2011   Sprain of lumbar region 07/31/2010   Rib pain, left 08/29/2009   Carotid artery stenosis, asymptomatic, bilateral 05/02/2009   Eczema, atopic 05/31/2008   Vitamin D deficiency 03/01/2008   BPH (benign prostatic hyperplasia) 08/06/2007   Fasting hyperglycemia 12/21/2006   History of right MCA infarct 2006    ONSET DATE: 07/27/21  REFERRING DIAG: R41.4 (ICD-10-CM) - Hemi-inattention I63.9 (ICD-10-CM) - Cerebrovascular accident (CVA), unspecified mechanism ([place])   THERAPY DIAG:  Attention and concentration deficit  History of falling  Other abnormalities of gait and mobility  Other symptoms and signs involving cognitive functions following cerebral infarction  Other lack of  coordination  PERTINENT HISTORY: L basal ganglia and corona radiata infarct 07/27/21 (had documented AMS for about 5 days prior w/ hospital work-up including MRI negative and symptoms attributed to a UTI); L lower homonymous quadrantanopia. Other PMH includes R MCA infarct in 2006 w/ L-sided hemiparesis, NCD, carotid artery disease (bilateral), HTN, HLD,CKD3, and hx of bladder cancer  PRECAUTIONS: Fall risk  SUBJECTIVE:   SUBJECTIVE ASSESSMENT: "I am using my L hand, I am, I am using it" Pt and his son report he had a fall this morning when walking, but his son was able to lower him slowly to the ground while holding his hand  PAIN: Are you having pain?  Pt reports the back of his head hurts   OBJECTIVE:   TODAY'S TREATMENT - 01/19/22:  Exercise Ball Sitting on large therapy ball, focusing on body awareness, balance and righting reactions, and core strength; OT had pt maintain balance in midline, releasing UE support from // bars for short periods of time. Pt required moderate verbal, tactile, and  gestural cues and encouragement to release UE support each rep  Then practiced ball toss with his son while seated on large therapy ball; completed about 25x w/ pt requiring verbal/gestural cues every few reps to use both hands to catch vs volleying ball back w/ RUE only. Pt demonstrated good righting reactions to maintain balance on therapy ball.  PVC Pipe Tree Constructional activity, connecting PVC pipe tree pieces together to facilitate bilateral integration, L-sided attention, and processing; pt able to complete activity w/ min verbal cues problem-solving use of LUE thoughout  LB Dressing Donning/doffing oversize shorts practiced w/ pt threading BLEs, transitioning from sit-to-stand, and pulling pants up before sitting and taking off both feet. Completed task w/ overall Min A, requiring redirection and verbal cues throughout for problem-solving and sequencing, physical assist when "stuck," and  extended time for processing    PATIENT EDUCATION: Provided condition-specific education to son, particularly related to pt diagnosis and current associated deficits and limitations Person educated: Patient and Child(ren) (son, Rob) Education method: Explanation Education comprehension: verbalized understanding   GOALS: Goals reviewed with patient? Yes   SHORT TERM GOALS: Target date: 12/29/2021   STG   Status:  1 Pt/caregiver will verbalize understanding of visual perceptual compensatory strategies Baseline: Decreased knowledge of visual deficits and strategies Met - 12/31/21  2 Pt will complete symbol cancellation task w/ at least 50% accuracy using visual compensatory strategies to indicate improved attention Baseline: 40% accuracy w/ multiple errors Met - 11/17/21  3 Pt will participate in bilateral coordination activity w/ no more than 3 cues for incorporation of LUE Baseline: L-sided inattention Met - 12/22/21: completed task w/ no cues for bil integration      LONG TERM GOALS: Target date: 01/13/2022   LTG   Status:  1 Pt will be able to don overhead shirt w/ SPV and verbal/gestural cues prn in at least 1 attempt Baseline: Min A to don/doff shirt sleeve on L side Progressing  2 Pt will be able to thread bottoms on BLEs or don socks w/ Mod A Baseline: Max A for LB dressing Met - 01/07/22   Pt will demonstrate donning bottoms, incorporating use of AE prn, w/ SPV for safety in at least 2 attempts Baseline: Min A to thread BLE on 01/07/22 Progressing  3 Pt will demonstrate supine-to-sit transition w/ cues from caregiver and incorporation of AE/DME prn Baseline: Mod-Max A w/ bed mobility Met - 12/03/21; SPV w/ bed ladder and cues  4 Pt will be able to use TV remote (or adaptive remote) to complete 1 task (e.g., turn TV on/of, turn volume up/down) w/ SPV Baseline: Difficulty  Not Progressing - d/c due to continued difficulty and agitation w/ task at home  5 Pt will increase Barthel Index  score by at least 5 to indicate improved level of participation in BADLs Baseline: 55/100 Progressing  6 Pt will be able to make a peanut butter and jelly sandwich w/ SPV and verbal/tactile cues prn Progressing     ASSESSMENT:   CLINICAL IMPRESSION: OT continued to focus on L-sided attention, body awareness, and use of LUE during symmetrical and asymmetrical bilateral coordination activities. Pt and his son did report a fall this morning, reporting "he was having a bad day" and pt did demonstrate smaller steps when ambulating today as well as decreased sustained attention during therapeutic activities. OT engaged pt is functional mobility from treatment room to therapy gym and back w/ verbal cues for pacing and picking up feet. Pt also previously  has demonstrated ability to don/doff shorts w/ mod cues and extended time, requiring Min A today. Throughout session, OT continued to incorporate assist for errorless learning prn, auditory/verbal/tactile cues, and immediate feedback and knowledge of results throughout based on current EBP for NCD.   PERFORMANCE DEFICITS in functional skills including ADLs, IADLs, coordination, dexterity, [place], GMC, mobility, balance, and vision, cognitive skills including attention, memory, orientation, problem solving, safety awareness, sequencing, and temperament/personality, and psychosocial skills including environmental adaptation, interpersonal interactions, and routines and behaviors.    IMPAIRMENTS are limiting patient from ADLs, IADLs, rest and sleep, leisure, and social participation.    COMORBIDITIES may have co-morbidities that affects occupational performance. Patient will benefit from skilled OT to address above impairments and improve overall function.     PLAN: OT FREQUENCY: 2x/week   OT DURATION: 10 weeks   PLANNED INTERVENTIONS: self care/ADL training, therapeutic exercise, therapeutic activity, neuromuscular re-education, balance training, functional  mobility training, aquatic therapy, splinting, moist heat, cryotherapy, patient/family education, cognitive remediation/compensation, visual/perceptual remediation/compensation, psychosocial skills training, energy conservation, and DME and/or AE instructions   RECOMMENDED OTHER SERVICES: Currently receiving PT services at this location   CONSULTED AND AGREED WITH PLAN OF CARE: Patient and family member/caregiver   PLAN FOR NEXT SESSION: Preparatory exercise(s) for L-sided attention, proprioception and kinesthetic sense prior to practice of ADL tasks; continue to address bilateral coordination and attention to task   Kathrine Cords, MSOT, OTR/L 01/19/22, 4:56 PM

## 2022-01-19 NOTE — Therapy (Signed)
[facility]. [place], [name], [address] Phone: [phone]   Fax:  [phone]  Physical Therapy Treatment Progress Note Reporting Period 12/22/21 to 01/19/22 for visit 21-30  See note below for Objective Data and Assessment of Progress/Goals.     Patient Details  Name: William [name] MRN: [id number] Date of Birth: 04/08/1941 Referring Provider (PT): Supple   Encounter Date: 01/19/2022   PT End of Session - 01/19/22 1536     Visit Number 30    Date for PT Re-Evaluation 02/01/22    Authorization Type HTA    PT Start Time 1530    PT Stop Time 1616    PT Time Calculation (min) 46 min    Equipment Utilized During Treatment Gait belt    Activity Tolerance Patient tolerated treatment well    Behavior During Therapy Flat affect;Restless             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   Mild, related to stroke history   Nocturia    Renal insufficiency 06/25/2013   S/P carotid endarterectomy    BILATERAL ICA--  PATENT PER DUPLEX  05-19-2012   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   Right calf   Urinary frequency    Vitamin D deficiency     Past Surgical History:  Procedure Laterality Date   APPENDECTOMY  AS CHILD   CARDIOVASCULAR STRESS TEST  03-27-2012  DR CRENSHAW   LOW RISK LEXISCAN  STUDY-- PROBABLE NORMAL PERFUSION AND SOFT TISSUE ATTENUATION/  NO ISCHEMIA/ EF 51%   CAROTID ENDARTERECTOMY Bilateral LEFT  11-12-2008  DR GREG HAYES   RIGHT ICA  2006  ([facility])   CYSTOSCOPY W/ RETROGRADES Bilateral 06/22/2021   Procedure: CYSTOSCOPY WITH RETROGRADE PYELOGRAM;  Surgeon: Franchot Gallo, MD;  Location: [facility];  Service: Urology;  Laterality: Bilateral;   CYSTOSCOPY WITH LITHOLAPAXY N/A 02/26/2013   Procedure: CYSTOSCOPY WITH LITHOLAPAXY;  Surgeon: Franchot Gallo, MD;  Location: [facility];  Service: Urology;  Laterality: N/A;   EYE SURGERY  Jan. 2016   cataract surgery both eyes   INGUINAL HERNIA REPAIR Right 11-08-2006   IR KYPHO EA ADDL LEVEL THORACIC OR LUMBAR  02/12/2021   IR RADIOLOGIST EVAL & MGMT  02/18/2021   MASS EXCISION N/A 03/03/2016   Procedure: EXCISION OF BACK  MASS;  Surgeon: Stark Klein, MD;  Location: [facility];  Service: General;  Laterality: N/A;   MOHS SURGERY Left 1/ 2016   Dr Nevada Crane-- Basal cell   PROSTATE SURGERY     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-C N/A 06/22/2021   Procedure: TRANSURETHRAL RESECTION OF BLADDER TUMOR;  Surgeon: Franchot Gallo, MD;  Location: [facility];  Service: Urology;  Laterality:  N/A;   TRANSURETHRAL RESECTION OF PROSTATE N/A 02/26/2013   Procedure: TRANSURETHRAL RESECTION OF THE PROSTATE WITH GYRUS INSTRUMENTS;  Surgeon: Franchot Gallo, MD;  Location: [facility];  Service: Urology;  Laterality: N/A;   TRANSURETHRAL RESECTION OF PROSTATE N/A 06/22/2021   Procedure: TRANSURETHRAL RESECTION OF THE PROSTATE (TURP);  Surgeon: Franchot Gallo, MD;  Location: [facility];  Service: Urology;  Laterality: N/A;    There were no vitals filed for this visit.   Subjective Assessment - 01/19/22 1538     Subjective Patient and sone report that William [name] this morning, son lowered him to the ground, no injures, denies  back pain, leg or arm pain, patient reports overall not feeling well after the fall                               [facility] Adult PT Treatment/Exercise - 01/19/22 0001       Ambulation/Gait   Gait Comments 100 feet with FWW, required a lot of cues to do this as he would get the walker way out and he would take the very small steps, gait a120 feet x 2 cues for posture and step length and safety, stairs 4" and 6" up and down      High Level Balance   High Level Balance Activities Side stepping;Backward walking;Direction changes    High Level Balance Comments ball toss, volleyball with beach ball, kicks, cone toe touches using  a SPC, colored cones and having him pick up cones with the hand I tell him and the color I tell him      Lumbar Exercises: Aerobic   Nustep levewl 5 x 5 minutes      Lumbar Exercises: Standing   Other Standing Lumbar Exercises hip abduction marches and extension 2.5# 2x10                       PT Short Term Goals - 12/14/21 1451       PT SHORT TERM GOAL #1   Title independent with initial HEP    Time 2    Period Weeks    Status On-going               PT Long Term Goals - 01/13/22 1452       PT LONG TERM GOAL #1   Title Understand fall risks, the importance of exercise and flexibility to health and function    Status Achieved                   Plan - 01/19/22 [place] is with his son today, they were walking this morning and William [name], the son reports that he lowered him to the ground, but William [name] reports some ankle pain has a bandaid with blood on it and he reports not feeling well, seems to be a little tired and a little more fearful of falling.  Tried a FWW, this did not work as he would get the walker out in front of him too far and then take small shuffling steps    PT Next Visit Plan continue to work with him on function and safety, getting up from floor    Consulted and  Agree with Plan of Care Patient             Patient will benefit from skilled therapeutic intervention in order  to improve the following deficits and impairments:  Abnormal gait, Decreased coordination, Decreased range of motion, Difficulty walking, Increased muscle spasms, Cardiopulmonary status limiting activity, Decreased endurance, Decreased activity tolerance, Pain, Impaired flexibility, Decreased balance, Decreased mobility, Decreased strength, Postural dysfunction, Increased edema  Visit Diagnosis: Other abnormalities of gait and mobility  History of falling  Difficulty in walking, not elsewhere classified  Hemiplegia and hemiparesis following cerebral infarction affecting left non-dominant side (HCC)  Repeated falls  Muscle weakness (generalized)     Problem List Patient Active Problem List   Diagnosis Date Noted   Acute pain of left shoulder 11/12/2021   Leukocytes in urine 11/12/2021   Urinary frequency 11/12/2021   Thrush 10/08/2021   Hemiplegia, dominant side S/P CVA (cerebrovascular accident) ([place]) 09/11/2021   Insomnia    Prediabetes    Chronic kidney disease (CKD), stage IV (severe) ([place])    Basal ganglia infarction ([place]) 07/29/2021   Transaminitis 07/27/2021   UTI (urinary tract infection) 07/27/2021   CVA (cerebral vascular accident) ([place]) 07/27/2021   Fall at home, initial encounter 07/27/2021   Hyperglycemia 07/27/2021   CKD (chronic kidney disease), stage IV ([place]) 07/27/2021   Cholelithiasis 07/27/2021   Hypoxia 07/27/2021   Nausea and vomiting 23/34/3568   Acute metabolic encephalopathy 61/68/3729   Normocytic anemia 07/27/2021   Chronic back pain 07/27/2021   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[place]) 06/22/2021   Closed fracture of first lumbar vertebra with routine healing 02/03/2021   Closed fracture of multiple ribs 11/18/2020   Anxiety 01/29/2020   Leg pain, bilateral 01/29/2020   Ingrown toenail 07/13/2019   Lumbar  spondylosis 05/02/2018   Pain in left knee 03/09/2018   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   Trochanteric bursitis of left hip 01/16/2018   Preventative health care 09/26/2017   HTN (hypertension) 07/19/2015   Hyperlipidemia 07/19/2015   Great toe pain 02/11/2014   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 [place], [name], [address] Phone: [phone]   Fax:  [phone]  Name: KYL GIVLER MRN: [id number] Date of Birth: February 08, 1941

## 2022-01-20 ENCOUNTER — Encounter: Payer: Self-pay | Admitting: Physical Therapy

## 2022-01-20 ENCOUNTER — Ambulatory Visit: Payer: PPO | Admitting: Physical Therapy

## 2022-01-20 DIAGNOSIS — R2689 Other abnormalities of gait and mobility: Secondary | ICD-10-CM

## 2022-01-20 DIAGNOSIS — R296 Repeated falls: Secondary | ICD-10-CM

## 2022-01-20 DIAGNOSIS — R41840 Attention and concentration deficit: Secondary | ICD-10-CM | POA: Diagnosis not present

## 2022-01-20 DIAGNOSIS — I69354 Hemiplegia and hemiparesis following cerebral infarction affecting left non-dominant side: Secondary | ICD-10-CM

## 2022-01-20 DIAGNOSIS — R262 Difficulty in walking, not elsewhere classified: Secondary | ICD-10-CM

## 2022-01-20 DIAGNOSIS — Z9181 History of falling: Secondary | ICD-10-CM

## 2022-01-20 DIAGNOSIS — M6281 Muscle weakness (generalized): Secondary | ICD-10-CM

## 2022-01-20 NOTE — Therapy (Signed)
[facility]. [place], [name], [address] Phone: [phone]   Fax:  [phone]  Physical Therapy Treatment  Patient Details  Name: William [name] MRN: [id number] Date of Birth: Sep 16, 1940 Referring Provider (PT): Supple   Encounter Date: 01/20/2022   PT End of Session - 01/20/22 1439     Visit Number 31    Date for PT Re-Evaluation 02/01/22    Authorization Type HTA    PT Start Time 1440    PT Stop Time 1526    PT Time Calculation (min) 46 min    Equipment Utilized During Treatment Gait belt    Activity Tolerance Patient tolerated treatment well    Behavior During Therapy Flat affect;Restless             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   Mild, related to stroke history   Nocturia    Renal insufficiency 06/25/2013   S/P carotid endarterectomy    BILATERAL ICA--  PATENT PER DUPLEX  05-19-2012   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   Right calf   Urinary frequency    Vitamin D deficiency     Past Surgical History:  Procedure Laterality Date   APPENDECTOMY  AS CHILD   CARDIOVASCULAR STRESS TEST  03-27-2012  DR CRENSHAW   LOW RISK LEXISCAN STUDY-- PROBABLE NORMAL PERFUSION AND SOFT TISSUE ATTENUATION/  NO ISCHEMIA/ EF 51%   CAROTID ENDARTERECTOMY Bilateral LEFT  11-12-2008  DR  GREG HAYES   RIGHT ICA  2006  ([facility])   CYSTOSCOPY W/ RETROGRADES Bilateral 06/22/2021   Procedure: CYSTOSCOPY WITH RETROGRADE PYELOGRAM;  Surgeon: Franchot Gallo, MD;  Location: [facility];  Service: Urology;  Laterality: Bilateral;   CYSTOSCOPY WITH LITHOLAPAXY N/A 02/26/2013   Procedure: CYSTOSCOPY WITH LITHOLAPAXY;  Surgeon: Franchot Gallo, MD;  Location: [facility];  Service: Urology;  Laterality: N/A;   EYE SURGERY  Jan. 2016   cataract surgery both eyes   INGUINAL HERNIA REPAIR Right 11-08-2006   IR KYPHO EA ADDL LEVEL THORACIC OR LUMBAR  02/12/2021   IR RADIOLOGIST EVAL & MGMT  02/18/2021   MASS EXCISION N/A 03/03/2016   Procedure: EXCISION OF BACK  MASS;  Surgeon: Stark Klein, MD;  Location: [facility];  Service: General;  Laterality: N/A;   MOHS SURGERY Left 1/ 2016   Dr Nevada Crane-- Basal cell   PROSTATE SURGERY     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-C N/A 06/22/2021   Procedure: TRANSURETHRAL RESECTION OF BLADDER TUMOR;  Surgeon: Franchot Gallo, MD;  Location: [facility];  Service: Urology;  Laterality: N/A;   TRANSURETHRAL RESECTION OF PROSTATE N/A 02/26/2013   Procedure: TRANSURETHRAL RESECTION OF THE PROSTATE WITH GYRUS INSTRUMENTS;  Surgeon: Franchot Gallo,  MD;  Location: [facility];  Service: Urology;  Laterality: N/A;   TRANSURETHRAL RESECTION OF PROSTATE N/A 06/22/2021   Procedure: TRANSURETHRAL RESECTION OF THE PROSTATE (TURP);  Surgeon: Franchot Gallo, MD;  Location: [facility];  Service: Urology;  Laterality: N/A;    There were no vitals filed for this visit.   Subjective Assessment - 01/20/22 1440     Subjective Patient reports that he is a little sore from the fall yesterday, no falls today                               [facility] Adult PT Treatment/Exercise - 01/20/22 0001       Transfers   Comments worked on bed mobility,  rolling, getting up and down from supine, side scooting      Ambulation/Gait   Gait Comments gait 100 feet x2 CGA, then outside in grass, cues for step length and speed      High Level Balance   High Level Balance Comments volley ball, side stepping      Lumbar Exercises: Aerobic   Nustep levewl 5 x 5 minutes      Lumbar Exercises: Machines for Strengthening   Cybex Knee Extension 5# left only 3x10    Cybex Knee Flexion 10# left only 3x10    Leg Press 20# x10, then no weight left only 2x10    Other Lumbar Machine Exercise 20# resisted gait fwd and backward      Lumbar Exercises: Seated   Sit to Stand 10 reps      Lumbar Exercises: Supine   Other Supine Lumbar Exercises ab work, partial sit ups                       PT Short Term Goals - 12/14/21 1451       PT SHORT TERM GOAL #1   Title independent with initial HEP    Time 2    Period Weeks    Status On-going               PT Long Term Goals - 01/13/22 1452       PT LONG TERM GOAL #1   Title Understand fall risks, the importance of exercise and flexibility to health and function    Status Achieved                   Plan - 01/20/22 1655     Clinical Impression Statement Patient was sore today from the fall yesterday AM, he c/o back, hip and rib pain.  No bruising noted.  I focused today on bed mobility and core and strength, some balance, he was a little distracted today and needed some cues to focus on the task    PT Next Visit Plan continue to work with him on function and safety, getting up from floor    Consulted and Agree with Plan of Care Patient             Patient will benefit from skilled therapeutic intervention in order to improve the following deficits and impairments:  Abnormal gait, Decreased coordination, Decreased range of motion, Difficulty walking, Increased muscle spasms, Cardiopulmonary status limiting activity, Decreased endurance, Decreased activity tolerance,  Pain, Impaired flexibility, Decreased balance, Decreased mobility, Decreased strength, Postural dysfunction, Increased edema  Visit Diagnosis: Other abnormalities of gait and mobility  History of falling  Difficulty in walking, not  elsewhere classified  Hemiplegia and hemiparesis following cerebral infarction affecting left non-dominant side (HCC)  Repeated falls  Muscle weakness (generalized)     Problem List Patient Active Problem List   Diagnosis Date Noted   Acute pain of left shoulder 11/12/2021   Leukocytes in urine 11/12/2021   Urinary frequency 11/12/2021   Thrush 10/08/2021   Hemiplegia, dominant side S/P CVA (cerebrovascular accident) ([place]) 09/11/2021   Insomnia    Prediabetes    Chronic kidney disease (CKD), stage IV (severe) ([place])    Basal ganglia infarction ([place]) 07/29/2021   Transaminitis 07/27/2021   UTI (urinary tract infection) 07/27/2021   CVA (cerebral vascular accident) ([place]) 07/27/2021   Fall at home, initial encounter 07/27/2021   Hyperglycemia 07/27/2021   CKD (chronic kidney disease), stage IV ([place]) 07/27/2021   Cholelithiasis 07/27/2021   Hypoxia 07/27/2021   Nausea and vomiting 40/81/4481   Acute metabolic encephalopathy 85/63/1497   Normocytic anemia 07/27/2021   Chronic back pain 07/27/2021   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[place]) 06/22/2021   Closed fracture of first lumbar vertebra with routine healing 02/03/2021   Closed fracture of multiple ribs 11/18/2020   Anxiety 01/29/2020   Leg pain, bilateral 01/29/2020   Ingrown toenail 07/13/2019   Lumbar spondylosis 05/02/2018   Pain in left knee 03/09/2018   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   Trochanteric bursitis of left hip 01/16/2018   Preventative health care 09/26/2017   HTN (hypertension) 07/19/2015   Hyperlipidemia 07/19/2015   Great toe pain 02/11/2014   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 [place], [name], [address] Phone: [phone]   Fax:  [phone]  Name: William [name] MRN: [id number] Date of Birth: July 30, 1940

## 2022-01-21 ENCOUNTER — Ambulatory Visit: Payer: PPO | Admitting: Physical Therapy

## 2022-01-21 ENCOUNTER — Ambulatory Visit: Payer: PPO | Admitting: Occupational Therapy

## 2022-01-21 ENCOUNTER — Encounter: Payer: Self-pay | Admitting: Physical Therapy

## 2022-01-21 ENCOUNTER — Encounter: Payer: Self-pay | Admitting: Occupational Therapy

## 2022-01-21 DIAGNOSIS — R2689 Other abnormalities of gait and mobility: Secondary | ICD-10-CM

## 2022-01-21 DIAGNOSIS — Z9181 History of falling: Secondary | ICD-10-CM

## 2022-01-21 DIAGNOSIS — R262 Difficulty in walking, not elsewhere classified: Secondary | ICD-10-CM

## 2022-01-21 DIAGNOSIS — R41840 Attention and concentration deficit: Secondary | ICD-10-CM

## 2022-01-21 DIAGNOSIS — R296 Repeated falls: Secondary | ICD-10-CM

## 2022-01-21 DIAGNOSIS — I69318 Other symptoms and signs involving cognitive functions following cerebral infarction: Secondary | ICD-10-CM

## 2022-01-21 DIAGNOSIS — R278 Other lack of coordination: Secondary | ICD-10-CM

## 2022-01-21 DIAGNOSIS — M6281 Muscle weakness (generalized): Secondary | ICD-10-CM

## 2022-01-21 DIAGNOSIS — I69354 Hemiplegia and hemiparesis following cerebral infarction affecting left non-dominant side: Secondary | ICD-10-CM

## 2022-01-21 NOTE — Therapy (Signed)
OUTPATIENT OCCUPATIONAL THERAPY TREATMENT NOTE   Patient Name: William [name] MRN: [id number] DOB:June 23, 1940, 81 y.o., male Today's Date: 01/21/2022  PCP: Roma Schanz, DO REFERRING PROVIDER: Carollee Herter, Alferd Apa, DO   OT End of Session - 01/21/22 1508     Visit Number 26    Number of Visits 31    Date for OT Re-Evaluation 02/12/22    Authorization Type [employer]    Authorization Time Period VL: [place]    Progress Note Due on Visit 30    OT Start Time 1446    OT Stop Time 1526    OT Time Calculation (min) 40 min    Activity Tolerance Patient tolerated treatment well    Behavior During Therapy Flat affect;Restless            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   Mild, related to stroke history   Nocturia    Renal insufficiency 06/25/2013   S/P carotid endarterectomy    BILATERAL ICA--  PATENT PER DUPLEX  05-19-2012   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   Right calf   Urinary frequency    Vitamin D deficiency    Past Surgical History:  Procedure Laterality Date   APPENDECTOMY  AS CHILD   CARDIOVASCULAR STRESS TEST  03-27-2012  DR CRENSHAW   LOW RISK LEXISCAN STUDY-- PROBABLE NORMAL PERFUSION AND SOFT TISSUE ATTENUATION/  NO ISCHEMIA/ EF 51%   CAROTID ENDARTERECTOMY Bilateral LEFT  11-12-2008  DR GREG HAYES   RIGHT ICA  2006   ([facility])   CYSTOSCOPY W/ RETROGRADES Bilateral 06/22/2021   Procedure: CYSTOSCOPY WITH RETROGRADE PYELOGRAM;  Surgeon: Franchot Gallo, MD;  Location[facility];  Service: Urology;  Laterality: Bilateral;   CYSTOSCOPY WITH LITHOLAPAXY N/A 02/26/2013   Procedure: CYSTOSCOPY WITH LITHOLAPAXY;  Surgeon: Franchot Gallo, MD;  Location: [facility];  Service: Urology;  Laterality: N/A;   EYE SURGERY  Jan. 2016   cataract surgery both eyes   INGUINAL HERNIA REPAIR Right 11-08-2006   IR KYPHO EA ADDL LEVEL THORACIC OR LUMBAR  02/12/2021   IR RADIOLOGIST EVAL & MGMT  02/18/2021   MASS EXCISION N/A 03/03/2016   Procedure: EXCISION OF BACK  MASS;  Surgeon: Stark Klein, MD;  Location: [place];  Service: General;  Laterality: N/A;   MOHS SURGERY Left 1/ 2016   Dr Nevada Crane-- Basal cell   PROSTATE SURGERY     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-C N/A 06/22/2021   Procedure: TRANSURETHRAL RESECTION OF BLADDER TUMOR;  Surgeon: Franchot Gallo, MD;  Location: [facility];  Service: Urology;  Laterality: N/A;   TRANSURETHRAL RESECTION OF PROSTATE N/A 02/26/2013   Procedure: TRANSURETHRAL RESECTION OF THE PROSTATE WITH GYRUS INSTRUMENTS;  Surgeon: Franchot Gallo, MD;  Location: [place];  Service: Urology;  Laterality: N/A;   TRANSURETHRAL RESECTION OF PROSTATE N/A 06/22/2021   Procedure: TRANSURETHRAL RESECTION OF THE PROSTATE (TURP);  Surgeon: Franchot Gallo, MD;  Location: [facility];  Service: Urology;  Laterality: N/A;   Patient Active Problem List   Diagnosis Date Noted   Acute pain of left shoulder 11/12/2021   Leukocytes in urine 11/12/2021   Urinary frequency 11/12/2021   Thrush 10/08/2021   Hemiplegia, dominant side S/P CVA (cerebrovascular accident) ([place]) 09/11/2021   Insomnia    Prediabetes    Chronic kidney disease (CKD), stage IV (severe) ([place])    Basal ganglia infarction  ([place]) 07/29/2021   Transaminitis 07/27/2021   UTI (urinary tract infection) 07/27/2021   CVA (cerebral vascular accident) ([place]) 07/27/2021   Fall at home, initial encounter 07/27/2021   Hyperglycemia 07/27/2021   CKD (chronic kidney disease), stage IV ([place]) 07/27/2021   Cholelithiasis 07/27/2021   Hypoxia 07/27/2021   Nausea and vomiting 47/82/9562   Acute metabolic encephalopathy 13/01/6577   Normocytic anemia 07/27/2021   Chronic back pain 07/27/2021   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[place]) 06/22/2021   Closed fracture of first lumbar vertebra with routine healing 02/03/2021   Closed fracture of multiple ribs 11/18/2020   Anxiety 01/29/2020   Leg pain, bilateral 01/29/2020   Ingrown toenail 07/13/2019   Lumbar spondylosis 05/02/2018   Pain in left knee 03/09/2018   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   Trochanteric bursitis of left hip 01/16/2018   Preventative health care 09/26/2017   HTN (hypertension) 07/19/2015   Hyperlipidemia 07/19/2015   Great toe pain 02/11/2014   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   Obesity (BMI 30-39.9) 06/25/2013   Renal insufficiency 06/25/2013   Weakness of left arm 06/25/2013   Sebaceous cyst 03/03/2011   Sprain of lumbar region 07/31/2010   Rib pain, left 08/29/2009   Carotid artery stenosis, asymptomatic, bilateral 05/02/2009   Eczema, atopic 05/31/2008   Vitamin D deficiency 03/01/2008   BPH (benign prostatic hyperplasia) 08/06/2007   Fasting hyperglycemia 12/21/2006   History of right MCA infarct 2006    ONSET DATE: 07/27/21  REFERRING DIAG: R41.4 (ICD-10-CM) - Hemi-inattention I63.9 (ICD-10-CM) - Cerebrovascular accident (CVA), unspecified mechanism ([place])   THERAPY DIAG:  Attention and concentration deficit  Other lack of coordination  History of falling  Other symptoms and signs involving cognitive functions following cerebral infarction  Other abnormalities of gait and  mobility  PERTINENT HISTORY: L basal ganglia and corona radiata infarct 07/27/21 (had documented AMS for about 5 days prior w/ hospital work-up including MRI negative and symptoms attributed to a UTI); L lower homonymous quadrantanopia. 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 No   OBJECTIVE:   TODAY'S TREATMENT - 01/21/22:  Car Transfers Practiced car transfer outside w/ using pt's car x2 w/ pt able to complete overall w/ SPV. Pt was able to sequence steps to get into the car well and then benefited from moderate verbal cues to reposition hips toward midline once in the seat; specific, direct cues were most effective "bring your head toward me," "push hips to the left"  Metronome Attempted simple marching while seated to metronome pacing and OT providing max verbal/tactile  cues w/ pt unable to pace marches correctly  despite grading down prn. OT then attempted clapping to metronome beat w/ pt unable to successful keep pace w/out HOHA.  BUE Coordination Pouring water from one cup to another focusing on both stabilizing w/ L hand and then pouring w/ L hand to address asymmetrical bilateral coordination during functional tasks; required occ verbal/tactile cues to avoid compensating w/ R hand and success did improve w/ repetition  LB Dressing Donning/doffing oversize shorts practiced w/ pt threading BLEs, transitioning from sit-to-stand, and pulling pants up before sitting and taking off both feet. Completed task w/ overall Min A, requiring redirection and verbal cues throughout for problem-solving and sequencing, physical assist when "stuck," and extended time for processing    PATIENT EDUCATION: Ongoing condition-specific  education related to therapeutic interventions completed this session Person educated: Patient and Spouse Education method: Explanation Education comprehension: verbalized understanding   GOALS: Goals reviewed with patient? Yes   SHORT TERM GOALS: Target date: 12/29/2021   STG   Status:  1 Pt/caregiver will verbalize understanding of visual perceptual compensatory strategies Baseline: Decreased knowledge of visual deficits and strategies Met - 12/31/21  2 Pt will complete symbol cancellation task w/ at least 50% accuracy using visual compensatory strategies to indicate improved attention Baseline: 40% accuracy w/ multiple errors Met - 11/17/21  3 Pt will participate in bilateral coordination activity w/ no more than 3 cues for incorporation of LUE Baseline: L-sided inattention Met - 12/22/21: completed task w/ no cues for bil integration      LONG TERM GOALS: Target date: 01/13/2022   LTG   Status:  1 Pt will be able to don overhead shirt w/ SPV and verbal/gestural cues prn in at least 1 attempt Baseline: Min A to don/doff shirt sleeve on L side Progressing  2 Pt will be able to thread bottoms on BLEs or don socks w/ Mod A Baseline: Max A for LB dressing Met - 01/07/22   Pt will demonstrate donning bottoms, incorporating use of AE prn, w/ SPV for safety in at least 2 attempts Baseline: Min A to thread BLE on 01/07/22 Met - 01/21/22  3 Pt will demonstrate supine-to-sit transition w/ cues from caregiver and incorporation of AE/DME prn Baseline: Mod-Max A w/ bed mobility Met - 12/03/21; SPV w/ bed ladder and cues  4 Pt will be able to use TV remote (or adaptive remote) to complete 1 task (e.g., turn TV on/of, turn volume up/down) w/ SPV Baseline: Difficulty  Not Progressing - d/c due to continued difficulty and agitation w/ task at home  5 Pt will increase Barthel Index score by at least 5 to indicate improved level of participation in BADLs Baseline: 55/100 Progressing  6 Pt will be able to make  a peanut butter and jelly sandwich w/ SPV and verbal/tactile cues prn Progressing     ASSESSMENT:   CLINICAL IMPRESSION: Session today focused on functional car transfers, metronome integration for improved pacing, particularly during ambulation, and continued practice using LUE during symmetrical and asymmetrical bilateral coordination tasks and activities including LB dressing. William [name] again required increased cues for pacing and picking up his feet when ambulating throughout treatment session. During functional tasks, pt continues to respond well to direct verbal cues w/ gestural cues prn. Throughout therapeutic activities, OT continued to incorporate assist for errorless learning prn, auditory/verbal/tactile cues, and immediate feedback and knowledge of results throughout based on current EBP for NCD.   PERFORMANCE DEFICITS in functional skills including ADLs, IADLs, coordination,  dexterity, Bonnie, GMC, mobility, balance, and vision, cognitive skills including attention, memory, orientation, problem solving, safety awareness, sequencing, and temperament/personality, and psychosocial skills including environmental adaptation, interpersonal interactions, and routines and behaviors.    IMPAIRMENTS are limiting patient from ADLs, IADLs, rest and sleep, leisure, and social participation.    COMORBIDITIES may have co-morbidities that affects occupational performance. Patient will benefit from skilled OT to address above impairments and improve overall function.     PLAN: OT FREQUENCY: 2x/week   OT DURATION: 10 weeks   PLANNED INTERVENTIONS: self care/ADL training, therapeutic exercise, therapeutic activity, neuromuscular re-education, balance training, functional mobility training, aquatic therapy, splinting, moist heat, cryotherapy, patient/family education, cognitive remediation/compensation, visual/perceptual remediation/compensation, psychosocial skills training, energy conservation, and DME and/or  AE instructions   RECOMMENDED OTHER SERVICES: Currently receiving PT services at this location   CONSULTED AND AGREED WITH PLAN OF CARE: Patient and family member/caregiver   PLAN FOR NEXT SESSION: Preparatory exercise(s) for L-sided attention, proprioception and kinesthetic sense prior to practice of ADL tasks; continue to address bilateral coordination and attention to task   Kathrine Cords, MSOT, OTR/L 01/21/22, 3:30 PM

## 2022-01-21 NOTE — Therapy (Signed)
[place] [place], [name], [address] Phone: [phone]   Fax:  [phone]  Physical Therapy Treatment  Patient Details  Name: William [name] MRN: [id number] Date of Birth: 12-Sep-1940 Referring Provider (PT): Supple   Encounter Date: 01/21/2022   PT End of Session - 01/21/22 1541     Visit Number 32    Date for PT Re-Evaluation 02/01/22    Authorization Type HTA    PT Start Time 1530    PT Stop Time 1615    PT Time Calculation (min) 45 min    Equipment Utilized During Treatment Gait belt    Activity Tolerance Patient tolerated treatment well    Behavior During Therapy [facility] for tasks assessed/performed             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   Mild, related to stroke history   Nocturia    Renal insufficiency 06/25/2013   S/P carotid endarterectomy    BILATERAL ICA--  PATENT PER DUPLEX  05-19-2012   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   Right calf   Urinary frequency    Vitamin D deficiency     Past Surgical History:  Procedure Laterality Date   APPENDECTOMY  AS CHILD   CARDIOVASCULAR STRESS TEST  03-27-2012  DR CRENSHAW   LOW RISK LEXISCAN STUDY-- PROBABLE NORMAL PERFUSION AND SOFT TISSUE ATTENUATION/  NO ISCHEMIA/ EF 51%   CAROTID ENDARTERECTOMY Bilateral LEFT   11-12-2008  DR GREG HAYES   RIGHT ICA  2006  ([facility])   CYSTOSCOPY W/ RETROGRADES Bilateral 06/22/2021   Procedure: CYSTOSCOPY WITH RETROGRADE PYELOGRAM;  Surgeon: Franchot Gallo, MD;  Location: [facility];  Service: Urology;  Laterality: Bilateral;   CYSTOSCOPY WITH LITHOLAPAXY N/A 02/26/2013   Procedure: CYSTOSCOPY WITH LITHOLAPAXY;  Surgeon: Franchot Gallo, MD;  Location: [facility];  Service: Urology;  Laterality: N/A;   EYE SURGERY  Jan. 2016   cataract surgery both eyes   INGUINAL HERNIA REPAIR Right 11-08-2006   IR KYPHO EA ADDL LEVEL THORACIC OR LUMBAR  02/12/2021   IR RADIOLOGIST EVAL & MGMT  02/18/2021   MASS EXCISION N/A 03/03/2016   Procedure: EXCISION OF BACK  MASS;  Surgeon: Stark Klein, MD;  Location: [place];  Service: General;  Laterality: N/A;   MOHS SURGERY Left 1/ 2016   Dr Nevada Crane-- Basal cell   PROSTATE SURGERY     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-C N/A 06/22/2021   Procedure: TRANSURETHRAL RESECTION OF BLADDER TUMOR;  Surgeon: Franchot Gallo, MD;  Location: [facility];  Service: Urology;  Laterality: N/A;   TRANSURETHRAL RESECTION OF PROSTATE N/A 02/26/2013   Procedure: TRANSURETHRAL RESECTION OF THE PROSTATE WITH GYRUS INSTRUMENTS;  Surgeon:  Franchot Gallo, MD;  Location: [facility];  Service: Urology;  Laterality: N/A;   TRANSURETHRAL RESECTION OF PROSTATE N/A 06/22/2021   Procedure: TRANSURETHRAL RESECTION OF THE PROSTATE (TURP);  Surgeon: Franchot Gallo, MD;  Location: [facility];  Service: Urology;  Laterality: N/A;    There were no vitals filed for this visit.   Subjective Assessment - 01/21/22 1541     Subjective Patient still with reports of soreness and some back pain, no falls                               [facility] Adult PT Treatment/Exercise - 01/21/22 0001       Transfers   Comments worked on kneeling donw  on knees and getting up      Ambulation/Gait   Gait Comments stairs step over step, gait 200 feet cues for big steps x 2, then outside, this is where he had difficulty with distraction      High Level Balance   High Level Balance Activities Side stepping;Backward walking;Direction changes    High Level Balance Comments volley ball standing      Lumbar Exercises: Aerobic   Nustep levewl 5 x 68minutes      Lumbar Exercises: Machines for Strengthening   Other Lumbar Machine Exercise 20# resisted gait fwd and backward                       PT Short Term Goals - 12/14/21 1451       PT SHORT TERM GOAL #1   Title independent with initial HEP    Time 2    Period Weeks    Status On-going               PT Long Term Goals - 01/21/22 1542       PT LONG TERM GOAL #2   Title decrease TUG time to 13 seconds    Status Partially Met      PT LONG TERM GOAL #3   Title walk around our building without rest     Status On-going      PT LONG TERM GOAL #4   Title increase LE strength to 4+/5    Status On-going      PT LONG TERM GOAL #5   Title increase Berg blance test score to 46/56    Status On-going                   Plan - 01/21/22 1648     Clinical Impression Statement This week he had a couple of good days and then a bad day.  He seems distracted more today with the walking but overall I have been able to have him out in the gym more and he still follow directions.  He was able to step over side stepping a stick for the first time today correctly.  Still struggles with stepping on and off sideways on the airex, his wife was out of town the past two weeks and is back today    PT Next Visit Plan continue to work with him on function and safety, getting up from floor    Consulted and Agree with Plan of Care Patient             Patient will benefit from skilled therapeutic intervention in order to improve the following deficits and impairments:   Abnormal gait, Decreased coordination,  Decreased range of motion, Difficulty walking, Increased muscle spasms, Cardiopulmonary status limiting activity, Decreased endurance, Decreased activity tolerance, Pain, Impaired flexibility, Decreased balance, Decreased mobility, Decreased strength, Postural dysfunction, Increased edema  Visit Diagnosis: Other abnormalities of gait and mobility  History of falling  Difficulty in walking, not elsewhere classified  Hemiplegia and hemiparesis following cerebral infarction affecting left non-dominant side (HCC)  Repeated falls  Muscle weakness (generalized)     Problem List Patient Active Problem List   Diagnosis Date Noted   Acute pain of left shoulder 11/12/2021   Leukocytes in urine 11/12/2021   Urinary frequency 11/12/2021   Thrush 10/08/2021   Hemiplegia, dominant side S/P CVA (cerebrovascular accident) ([place]) 09/11/2021   Insomnia    Prediabetes    Chronic kidney disease (CKD), stage IV (severe) (HCC)    Basal ganglia infarction ([place]) 07/29/2021   Transaminitis 07/27/2021   UTI (urinary tract infection) 07/27/2021   CVA (cerebral vascular accident) ([place]) 07/27/2021   Fall at home, initial encounter 07/27/2021   Hyperglycemia 07/27/2021   CKD (chronic kidney disease), stage IV ([place]) 07/27/2021   Cholelithiasis 07/27/2021   Hypoxia 07/27/2021   Nausea and vomiting 86/76/1950   Acute metabolic encephalopathy 93/26/7124   Normocytic anemia 07/27/2021   Chronic back pain 07/27/2021   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[place]) 06/22/2021   Closed fracture of first lumbar vertebra with routine healing 02/03/2021   Closed fracture of multiple ribs 11/18/2020   Anxiety 01/29/2020   Leg pain, bilateral 01/29/2020   Ingrown toenail 07/13/2019   Lumbar spondylosis 05/02/2018   Pain in left knee 03/09/2018   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   Trochanteric bursitis of left hip 01/16/2018   Preventative health care  09/26/2017   HTN (hypertension) 07/19/2015   Hyperlipidemia 07/19/2015   Great toe pain 02/11/2014   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 [place], [name], [address] Phone: [phone]   Fax:  [phone]  Name: William [name] MRN: [id number] Date of Birth: [birth year]

## 2022-01-25 ENCOUNTER — Ambulatory Visit: Payer: PPO | Admitting: Physical Therapy

## 2022-01-25 ENCOUNTER — Encounter: Payer: Self-pay | Admitting: Occupational Therapy

## 2022-01-25 ENCOUNTER — Ambulatory Visit: Payer: PPO | Admitting: Occupational Therapy

## 2022-01-25 ENCOUNTER — Encounter: Payer: Self-pay | Admitting: Physical Therapy

## 2022-01-25 ENCOUNTER — Other Ambulatory Visit (HOSPITAL_BASED_OUTPATIENT_CLINIC_OR_DEPARTMENT_OTHER): Payer: Self-pay

## 2022-01-25 DIAGNOSIS — R296 Repeated falls: Secondary | ICD-10-CM

## 2022-01-25 DIAGNOSIS — R278 Other lack of coordination: Secondary | ICD-10-CM

## 2022-01-25 DIAGNOSIS — R2689 Other abnormalities of gait and mobility: Secondary | ICD-10-CM

## 2022-01-25 DIAGNOSIS — R41840 Attention and concentration deficit: Secondary | ICD-10-CM

## 2022-01-25 DIAGNOSIS — Z9181 History of falling: Secondary | ICD-10-CM

## 2022-01-25 DIAGNOSIS — I69354 Hemiplegia and hemiparesis following cerebral infarction affecting left non-dominant side: Secondary | ICD-10-CM

## 2022-01-25 DIAGNOSIS — I69318 Other symptoms and signs involving cognitive functions following cerebral infarction: Secondary | ICD-10-CM

## 2022-01-25 DIAGNOSIS — M6281 Muscle weakness (generalized): Secondary | ICD-10-CM

## 2022-01-25 DIAGNOSIS — R262 Difficulty in walking, not elsewhere classified: Secondary | ICD-10-CM

## 2022-01-25 NOTE — Therapy (Signed)
[facility] [place], [name], [address] Phone: [phone]   Fax:  [phone]  Physical Therapy Treatment  Patient Details  Name: William [name] MRN: [id number] Date of Birth: 1941/03/10 Referring Provider (PT): Supple   Encounter Date: 01/25/2022   PT End of Session - 01/25/22 1537     Visit Number 33    Date for PT Re-Evaluation 02/01/22    Authorization Type HTA    PT Start Time 1520    PT Stop Time 1615    PT Time Calculation (min) 55 min    Equipment Utilized During Treatment Gait belt    Activity Tolerance Patient tolerated treatment well    Behavior During Therapy Flat affect;Restless             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   Mild, related to stroke history   Nocturia    Renal insufficiency 06/25/2013   S/P carotid endarterectomy    BILATERAL ICA--  PATENT PER DUPLEX  05-19-2012   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   Right calf   Urinary frequency    Vitamin D deficiency     Past Surgical History:  Procedure Laterality Date   APPENDECTOMY  AS CHILD   CARDIOVASCULAR STRESS TEST  03-27-2012  DR CRENSHAW   LOW RISK LEXISCAN STUDY-- PROBABLE NORMAL PERFUSION AND SOFT TISSUE ATTENUATION/  NO ISCHEMIA/ EF 51%   CAROTID ENDARTERECTOMY Bilateral LEFT  11-12-2008  DR  GREG HAYES   RIGHT ICA  2006  ([facility])   CYSTOSCOPY W/ RETROGRADES Bilateral 06/22/2021   Procedure: CYSTOSCOPY WITH RETROGRADE PYELOGRAM;  Surgeon: Franchot Gallo, MD;  Location[facility];  Service: Urology;  Laterality: Bilateral;   CYSTOSCOPY WITH LITHOLAPAXY N/A 02/26/2013   Procedure: CYSTOSCOPY WITH LITHOLAPAXY;  Surgeon: Franchot Gallo, MD;  Location: [facility];  Service: Urology;  Laterality: N/A;   EYE SURGERY  Jan. 2016   cataract surgery both eyes   INGUINAL HERNIA REPAIR Right 11-08-2006   IR KYPHO EA ADDL LEVEL THORACIC OR LUMBAR  02/12/2021   IR RADIOLOGIST EVAL & MGMT  02/18/2021   MASS EXCISION N/A 03/03/2016   Procedure: EXCISION OF BACK  MASS;  Surgeon: Stark Klein, MD;  Location: [facility];  Service: General;  Laterality: N/A;   MOHS SURGERY Left 1/ 2016   Dr Nevada Crane-- Basal cell   PROSTATE SURGERY     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-C N/A 06/22/2021   Procedure: TRANSURETHRAL RESECTION OF BLADDER TUMOR;  Surgeon: Franchot Gallo, MD;  Location: [facility];  Service: Urology;  Laterality: N/A;   TRANSURETHRAL RESECTION OF PROSTATE N/A 02/26/2013   Procedure: TRANSURETHRAL RESECTION OF THE PROSTATE WITH GYRUS INSTRUMENTS;  Surgeon: Franchot Gallo,  MD;  Location: [facility] [facility];  Service: Urology;  Laterality: N/A;   TRANSURETHRAL RESECTION OF PROSTATE N/A 06/22/2021   Procedure: TRANSURETHRAL RESECTION OF THE PROSTATE (TURP);  Surgeon: Franchot Gallo, MD;  Location: [facility];  Service: Urology;  Laterality: N/A;    There were no vitals filed for this visit.   Subjective Assessment - 01/25/22 1537     Subjective Patients wife reports a fall Saturday evening, she reports that he wanted to go for a 3rd walk that day, and he got going too fast and fell, he reports some back and side pain    Currently in Pain? Yes    Pain Score 5     Pain Location  Back    Pain Orientation Lower;Left    Pain Descriptors / Indicators Sore    Aggravating Factors  falling                               [facility] Adult PT Treatment/Exercise - 01/25/22 0001       Transfers   Comments on mat table side scooting each direction      Ambulation/Gait   Gait Comments worked on gait with walker and 10# on the left side of the walker to help him not turn it over.  did okay, difficulty turning, did better with not having the walker on two wheels.  Stairs step over step cues for left hand, then to the car, he had a scuff of the toe and a near fall but was able to correct on his own      High Level Balance   High Level Balance Activities Side stepping;Backward walking;Direction changes    High Level Balance Comments volley ball standing      Lumbar Exercises: Standing   Other Standing Lumbar Exercises hip abduction marches and extension 2.5# 2x10                       PT Short Term Goals - 12/14/21 1451       PT SHORT TERM GOAL #1   Title independent with initial HEP    Time 2    Period Weeks    Status On-going               PT Long Term Goals - 01/21/22 1542       PT LONG TERM GOAL #2   Title decrease TUG time to 13 seconds    Status Partially Met      PT LONG TERM GOAL #3   Title walk around our building without rest     Status On-going      PT LONG TERM GOAL #4   Title increase LE strength to 4+/5    Status On-going      PT LONG TERM GOAL #5   Title increase Berg blance test score to 46/56    Status On-going                   Plan - 01/25/22 1847     Clinical Impression Statement Patient had a fall wife reports it was the 3rd walk of the day and he was tired, reports that he got going forward and leaning and could not stop and she could not stop him, he has a little more c/o soreness today, in the back, he will see MD tomorrow.  Tried using a FWW today with 10#, then  decreased to 5# and  then down to 3#, he did better with this keeping it on the 4 points and not on two points, the only issue is with this shuffles a little more and gets the walker too far out.  Difficult for him to correct    PT Next Visit Plan continue to work with him on function and safety, getting up from floor    Consulted and Agree with Plan of Care Patient             Patient will benefit from skilled therapeutic intervention in order to improve the following deficits and impairments:  Abnormal gait, Decreased coordination, Decreased range of motion, Difficulty walking, Increased muscle spasms, Cardiopulmonary status limiting activity, Decreased endurance, Decreased activity tolerance, Pain, Impaired flexibility, Decreased balance, Decreased mobility, Decreased strength, Postural dysfunction, Increased edema  Visit Diagnosis: Attention and concentration deficit  Other lack of coordination  History of falling  Other abnormalities of gait and mobility  Difficulty in walking, not elsewhere classified  Hemiplegia and hemiparesis following cerebral infarction affecting left non-dominant side (HCC)  Repeated falls  Muscle weakness (generalized)     Problem List Patient Active Problem List   Diagnosis Date Noted   Acute pain of left shoulder 11/12/2021   Leukocytes in urine 11/12/2021   Urinary frequency 11/12/2021   Thrush 10/08/2021   Hemiplegia, dominant side S/P CVA (cerebrovascular accident) ([place]) 09/11/2021   Insomnia    Prediabetes    Chronic kidney disease (CKD), stage IV (severe) (HCC)    Basal ganglia infarction ([place]) 07/29/2021   Transaminitis 07/27/2021   UTI (urinary tract infection) 07/27/2021   CVA (cerebral vascular accident) ([place]) 07/27/2021   Fall at home, initial encounter 07/27/2021   Hyperglycemia 07/27/2021   CKD (chronic kidney disease), stage IV ([place]) 07/27/2021   Cholelithiasis 07/27/2021   Hypoxia 07/27/2021   Nausea and vomiting 36/14/4315   Acute  metabolic encephalopathy 40/01/6760   Normocytic anemia 07/27/2021   Chronic back pain 07/27/2021   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[place]) 06/22/2021   Closed fracture of first lumbar vertebra with routine healing 02/03/2021   Closed fracture of multiple ribs 11/18/2020   Anxiety 01/29/2020   Leg pain, bilateral 01/29/2020   Ingrown toenail 07/13/2019   Lumbar spondylosis 05/02/2018   Pain in left knee 03/09/2018   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   Trochanteric bursitis of left hip 01/16/2018   Preventative health care 09/26/2017   HTN (hypertension) 07/19/2015   Hyperlipidemia 07/19/2015   Great toe pain 02/11/2014   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 [place], [name], [address] Phone: [phone]   Fax:  [phone]  Name: William [name] MRN: [id number] Date of Birth: Dec 12, 1940

## 2022-01-25 NOTE — Therapy (Unsigned)
OUTPATIENT OCCUPATIONAL THERAPY TREATMENT NOTE   Patient Name: William [name] MRN: [id number] DOB:04-Nov-1940, 81 y.o., male Today's Date: 01/25/2022  PCP: Roma Schanz, DO REFERRING PROVIDER: Carollee Herter, Alferd Apa, DO   OT End of Session - 01/25/22 1505     Visit Number 27    Number of Visits 31    Date for OT Re-Evaluation 02/12/22    Authorization Type [employer]    Authorization Time Period VL: [place]    Progress Note Due on Visit 30    OT Start Time 1446    OT Stop Time 1530    OT Time Calculation (min) 44 min    Activity Tolerance Patient tolerated treatment well    Behavior During Therapy Flat affect;Restless            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   Mild, related to stroke history   Nocturia    Renal insufficiency 06/25/2013   S/P carotid endarterectomy    BILATERAL ICA--  PATENT PER DUPLEX  05-19-2012   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   Right calf   Urinary frequency    Vitamin D deficiency    Past Surgical History:  Procedure Laterality Date   APPENDECTOMY  AS CHILD   CARDIOVASCULAR STRESS TEST  03-27-2012  DR CRENSHAW   LOW RISK LEXISCAN STUDY-- PROBABLE NORMAL PERFUSION AND SOFT TISSUE ATTENUATION/  NO ISCHEMIA/ EF 51%   CAROTID ENDARTERECTOMY Bilateral LEFT  11-12-2008  DR GREG HAYES   RIGHT ICA  2006   ([facility])   CYSTOSCOPY W/ RETROGRADES Bilateral 06/22/2021   Procedure: CYSTOSCOPY WITH RETROGRADE PYELOGRAM;  Surgeon: Franchot Gallo, MD;  Location: [facility];  Service: Urology;  Laterality: Bilateral;   CYSTOSCOPY WITH LITHOLAPAXY N/A 02/26/2013   Procedure: CYSTOSCOPY WITH LITHOLAPAXY;  Surgeon: Franchot Gallo, MD;  Location: [facility];  Service: Urology;  Laterality: N/A;   EYE SURGERY  Jan. 2016   cataract surgery both eyes   INGUINAL HERNIA REPAIR Right 11-08-2006   IR KYPHO EA ADDL LEVEL THORACIC OR LUMBAR  02/12/2021   IR RADIOLOGIST EVAL & MGMT  02/18/2021   MASS EXCISION N/A 03/03/2016   Procedure: EXCISION OF BACK  MASS;  Surgeon: Stark Klein, MD;  Location: [facility];  Service: General;  Laterality: N/A;   MOHS SURGERY Left 1/ 2016   Dr Nevada Crane-- Basal cell   PROSTATE SURGERY     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-C N/A 06/22/2021   Procedure: TRANSURETHRAL RESECTION OF BLADDER TUMOR;  Surgeon: Franchot Gallo, MD;  Location[facility];  Service: Urology;  Laterality: N/A;   TRANSURETHRAL RESECTION OF PROSTATE N/A 02/26/2013   Procedure: TRANSURETHRAL RESECTION OF THE PROSTATE WITH GYRUS INSTRUMENTS;  Surgeon: Franchot Gallo, MD;  Location: [place];  Service: Urology;  Laterality: N/A;   TRANSURETHRAL RESECTION OF PROSTATE N/A 06/22/2021   Procedure: TRANSURETHRAL RESECTION OF THE PROSTATE (TURP);  Surgeon: Franchot Gallo, MD;  Location: [facility];  Service: Urology;  Laterality: N/A;   Patient Active Problem List   Diagnosis Date Noted   Acute pain of left shoulder 11/12/2021   Leukocytes in urine 11/12/2021   Urinary frequency 11/12/2021   Thrush 10/08/2021   Hemiplegia, dominant side S/P CVA (cerebrovascular accident) ([place]) 09/11/2021   Insomnia    Prediabetes    Chronic kidney disease (CKD), stage IV (severe) ([place])    Basal ganglia infarction  ([place]) 07/29/2021   Transaminitis 07/27/2021   UTI (urinary tract infection) 07/27/2021   CVA (cerebral vascular accident) ([place]) 07/27/2021   Fall at home, initial encounter 07/27/2021   Hyperglycemia 07/27/2021   CKD (chronic kidney disease), stage IV ([place]) 07/27/2021   Cholelithiasis 07/27/2021   Hypoxia 07/27/2021   Nausea and vomiting 84/69/6295   Acute metabolic encephalopathy 28/41/3244   Normocytic anemia 07/27/2021   Chronic back pain 07/27/2021   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[place]) 06/22/2021   Closed fracture of first lumbar vertebra with routine healing 02/03/2021   Closed fracture of multiple ribs 11/18/2020   Anxiety 01/29/2020   Leg pain, bilateral 01/29/2020   Ingrown toenail 07/13/2019   Lumbar spondylosis 05/02/2018   Pain in left knee 03/09/2018   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   Trochanteric bursitis of left hip 01/16/2018   Preventative health care 09/26/2017   HTN (hypertension) 07/19/2015   Hyperlipidemia 07/19/2015   Great toe pain 02/11/2014   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   Obesity (BMI 30-39.9) 06/25/2013   Renal insufficiency 06/25/2013   Weakness of left arm 06/25/2013   Sebaceous cyst 03/03/2011   Sprain of lumbar region 07/31/2010   Rib pain, left 08/29/2009   Carotid artery stenosis, asymptomatic, bilateral 05/02/2009   Eczema, atopic 05/31/2008   Vitamin D deficiency 03/01/2008   BPH (benign prostatic hyperplasia) 08/06/2007   Fasting hyperglycemia 12/21/2006   History of right MCA infarct 2006    ONSET DATE: 07/27/21  REFERRING DIAG: R41.4 (ICD-10-CM) - Hemi-inattention I63.9 (ICD-10-CM) - Cerebrovascular accident (CVA), unspecified mechanism ([place])   THERAPY DIAG:  Attention and concentration deficit  Other lack of coordination  History of falling  Other symptoms and signs involving cognitive functions following cerebral infarction  Other abnormalities of gait and  mobility  PERTINENT HISTORY: L basal ganglia and corona radiata infarct 07/27/21 (had documented AMS for about 5 days prior w/ hospital work-up including MRI negative and symptoms attributed to a UTI); L lower homonymous quadrantanopia. Other PMH includes R MCA infarct in 2006 w/ L-sided hemiparesis, NCD, carotid artery disease (bilateral), HTN, HLD,CKD3, and hx of bladder cancer  PRECAUTIONS: Fall risk  SUBJECTIVE:   SUBJECTIVE ASSESSMENT: Pt and his spouse report [name] had another fall on Saturday on his 3rd walk of the day and hurt his back and the left side of his ribs again; has a follow-up w/ PCP tomorrow  PAIN: Are you having pain? Yes: NPRS scale: 5-7/10 Pain location: back; L side of ribs Pain description: "it hurts;" throbbing Aggravating factors: movement Relieving factors: pain patch; ice   OBJECTIVE:   TODAY'S TREATMENT - 01/25/22:  Fall Prevention/ Functional Mobility OT reviewed fall prevention considerations w/ functional mobility, particularly outside the home. Practiced w/ RW, ambulating around clinic w/ close SPV, about 150 ft. OT added 10 lb  wrist weight to L side to provide additional input through LUE and prevent lifting of L side of walker when walking w/ positive results  Functional Activities Reviewed participation in functional activities including: simple meal prep, bed mobility, UB/LB dressing, shaving, brushing teeth, toileting, and positioning during meals in order to identify functional tasks pt continues to experience difficulty with and will benefit from addressing in skilled OT.  Meals/self-feeding - able to make his own coffee but has been more difficult this week, work on spreading condiments on bread, swivel chair at table working well; Bed mobility - has a bed rail that is moveable, bed ladder working well; Dressing - donning overhead shirt, threading long pants as pt is having success w/ shorts; Grooming - difficulty getting toothpaste on toothbrush,  shaving is better Toileting - consistency/schedule working better Leisure - perseverates pushing TV remote buttons, answering the phone  Buttoning Practiced buttoning shirt w/ pt demonstrating significant difficulty w/ asymmetrical bilateral coordination and sustained attention/attention to task limiting to success. Pt required Max A/HOHA to complete 1 button. OT then incorporated button hook w/ positive results; able to complete 3 buttons using AE w/ Mod A    PATIENT EDUCATION: Ongoing condition-specific education related to therapeutic interventions completed this session Person educated: Patient and Spouse Education method: Explanation Education comprehension: verbalized understanding   GOALS: Goals reviewed with patient? Yes   SHORT TERM GOALS: Target date: 12/29/2021   STG   Status:  1 Pt/caregiver will verbalize understanding of visual perceptual compensatory strategies Baseline: Decreased knowledge of visual deficits and strategies Met - 12/31/21  2 Pt will complete symbol cancellation task w/ at least 50% accuracy using visual compensatory strategies to indicate improved attention Baseline: 40% accuracy w/ multiple errors Met - 11/17/21  3 Pt will participate in bilateral coordination activity w/ no more than 3 cues for incorporation of LUE Baseline: L-sided inattention Met - 12/22/21: completed task w/ no cues for bil integration      LONG TERM GOALS: Target date: 01/13/2022   LTG   Status:  1 Pt will be able to don overhead shirt w/ SPV and verbal/gestural cues prn in at least 1 attempt Baseline: Min A to don/doff shirt sleeve on L side Progressing  2 Pt will be able to thread bottoms on BLEs or don socks w/ Mod A Baseline: Max A for LB dressing Met - 01/07/22   Pt will demonstrate donning bottoms, incorporating use of AE prn, w/ SPV for safety in at least 2 attempts Baseline: Min A to thread BLE on 01/07/22 Met - 01/21/22  3 Pt will demonstrate supine-to-sit transition w/ cues  from caregiver and incorporation of AE/DME prn Baseline: Mod-Max A w/ bed mobility Met - 12/03/21; SPV w/ bed ladder and cues  4 Pt will be able to use TV remote (or adaptive remote) to complete 1 task (e.g., turn TV on/of, turn volume up/down) w/ SPV Baseline: Difficulty  Not Progressing - d/c due to continued difficulty and agitation w/ task at home  5 Pt will increase Barthel Index score by at least 5 to indicate improved level of participation in BADLs Baseline: 55/100 Progressing  6 Pt will be able to make a peanut butter and jelly sandwich w/ SPV and verbal/tactile cues prn Progressing     ASSESSMENT:   CLINICAL IMPRESSION: Due to increased falls (2 outside in the past week), OT problem-solved w/ pt and his wife to trial AD to increase safety. Previously when attempting to use standard RW, pt lifts  L side, having difficulty maintaining all 4 points of contact due to L-sided inattention. OT incorporated weight on L side of walker to prevent this w/ positive results, but pt continued to require significant cues for pacing and to pick up feet. OT also reviewed current level of participation in functional activities, discussing potential benefit of decreasing OT to 1x/week considering pt's spouse's understanding learned strategies and to allow for independent practice at home; pt and spouse verbalized understanding. Will trial 1x/week frequency next week and closely monitor for appropriateness. Pt continues to benefit from incorporation of assistance for errorless learning prn, auditory/verbal/tactile cues, and immediate feedback and knowledge of results throughout based on current EBP for NCD.   PERFORMANCE DEFICITS in functional skills including ADLs, IADLs, coordination, dexterity, [place], [place], mobility, balance, and vision, cognitive skills including attention, memory, orientation, problem solving, safety awareness, sequencing, and temperament/personality, and psychosocial skills including  environmental adaptation, interpersonal interactions, and routines and behaviors.    IMPAIRMENTS are limiting patient from ADLs, IADLs, rest and sleep, leisure, and social participation.    COMORBIDITIES may have co-morbidities that affects occupational performance. Patient will benefit from skilled OT to address above impairments and improve overall function.     PLAN: OT FREQUENCY: 2x/week   OT DURATION: 10 weeks   PLANNED INTERVENTIONS: self care/ADL training, therapeutic exercise, therapeutic activity, neuromuscular re-education, balance training, functional mobility training, aquatic therapy, splinting, moist heat, cryotherapy, patient/family education, cognitive remediation/compensation, visual/perceptual remediation/compensation, psychosocial skills training, energy conservation, and DME and/or AE instructions   RECOMMENDED OTHER SERVICES: Currently receiving PT services at this location   CONSULTED AND AGREED WITH PLAN OF CARE: Patient and family member/caregiver   PLAN FOR NEXT SESSION: Preparatory exercise(s) for L-sided attention, proprioception and kinesthetic sense prior to practice of ADL tasks; continue to address bilateral coordination and attention to task   Kathrine Cords, MSOT, OTR/L 01/25/22, 4:47 PM

## 2022-01-26 ENCOUNTER — Ambulatory Visit (INDEPENDENT_AMBULATORY_CARE_PROVIDER_SITE_OTHER): Payer: PPO | Admitting: Family Medicine

## 2022-01-26 ENCOUNTER — Ambulatory Visit (HOSPITAL_BASED_OUTPATIENT_CLINIC_OR_DEPARTMENT_OTHER)
Admission: RE | Admit: 2022-01-26 | Discharge: 2022-01-26 | Disposition: A | Discharge status: Home / Self Care | Payer: PPO | Source: Ambulatory Visit | Attending: Family Medicine | Admitting: Family Medicine

## 2022-01-26 ENCOUNTER — Encounter: Payer: Self-pay | Admitting: Family Medicine

## 2022-01-26 ENCOUNTER — Ambulatory Visit (HOSPITAL_COMMUNITY): Payer: PPO | Admitting: Psychiatry

## 2022-01-26 ENCOUNTER — Other Ambulatory Visit (HOSPITAL_BASED_OUTPATIENT_CLINIC_OR_DEPARTMENT_OTHER): Payer: Self-pay

## 2022-01-26 VITALS — BP 132/82 | HR 73 | Temp 97.7°F | Resp 20 | Ht 71.0 in

## 2022-01-26 DIAGNOSIS — R0781 Pleurodynia: Secondary | ICD-10-CM | POA: Diagnosis not present

## 2022-01-26 DIAGNOSIS — M25522 Pain in left elbow: Secondary | ICD-10-CM | POA: Diagnosis not present

## 2022-01-26 DIAGNOSIS — F0630 Mood disorder due to known physiological condition, unspecified: Secondary | ICD-10-CM

## 2022-01-26 DIAGNOSIS — W19XXXA Unspecified fall, initial encounter: Secondary | ICD-10-CM | POA: Diagnosis not present

## 2022-01-26 DIAGNOSIS — S42022A Displaced fracture of shaft of left clavicle, initial encounter for closed fracture: Secondary | ICD-10-CM | POA: Diagnosis not present

## 2022-01-26 DIAGNOSIS — M549 Dorsalgia, unspecified: Secondary | ICD-10-CM | POA: Insufficient documentation

## 2022-01-26 DIAGNOSIS — M546 Pain in thoracic spine: Secondary | ICD-10-CM | POA: Diagnosis not present

## 2022-01-26 MED ORDER — HYDROXYZINE PAMOATE 25 MG PO CAPS
ORAL_CAPSULE | ORAL | 2 refills | Status: DC
  Filled 2022-01-26: qty 60, 60d supply, fill #0

## 2022-01-26 MED ORDER — LOREEV XR 1 MG PO CS24
1.0000 mg | EXTENDED_RELEASE_CAPSULE | Freq: Every day | ORAL | 3 refills | Status: DC
  Filled 2022-01-26 – 2022-02-14 (×2): qty 30, 30d supply, fill #0
  Filled 2022-04-09: qty 3, 3d supply, fill #1
  Filled 2022-04-09: qty 27, 27d supply, fill #1
  Filled 2022-05-23: qty 30, 30d supply, fill #2

## 2022-01-26 MED ORDER — TRAZODONE HCL 150 MG PO TABS
75.0000 mg | ORAL_TABLET | Freq: Every day | ORAL | 0 refills | Status: DC
  Filled 2022-01-26 – 2022-02-09 (×3): qty 30, 60d supply, fill #0

## 2022-01-26 MED ORDER — LORAZEPAM 0.5 MG PO TABS
0.2500 mg | ORAL_TABLET | Freq: Every day | ORAL | 4 refills | Status: DC
  Filled 2022-01-26: qty 30, 60d supply, fill #0

## 2022-01-26 MED ORDER — TRAMADOL HCL 50 MG PO TABS: 50.0000 mg | ORAL_TABLET | Freq: Two times a day (BID) | ORAL | Status: DC | PRN

## 2022-01-26 NOTE — Assessment & Plan Note (Signed)
con't with PT

## 2022-01-26 NOTE — Progress Notes (Signed)
Patient ID: William [name], male   DOB: [birth year], 81 y.o.   MRN: [id number] [name] MD Progress Note  01/26/2022 2:34 PM William [name]  MRN:  [id number] Subjective:  Feeling good Principal Problem: Mild neurocognitive disorde    Today the patient is seen with his wife William [name].  The patient is only doing fairly well.  He is demonstrating increasing sundowning.  He gets irritable and uncooperative as the night comes.  At times she has been even a bit physically aggressive.  William [name] is doing her best job trying to manage him.  They take walks 2 times a day.  Patient unfortunately a couple days ago had a fall and had some x-rays.  I do not think he broke any bones he is taking something mild for pain.  His blood level of Tegretol is 5.  He was taking 100 mg of Tegretol 3 times daily liquid form.  Patient is also taking Ativan 0.25 mg in the oral form but he also takes 1 mg nightly of Loorev.  Patient takes trazodone 75 mg.  Overall the patient is eating well.  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  The patient still able to interact with me and still seems calm and friendly at this time of the day.  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   Mild, related to stroke history   Nocturia    Renal insufficiency 06/25/2013   S/P carotid endarterectomy    BILATERAL ICA--  PATENT PER  DUPLEX  05-19-2012   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   Right calf   Urinary frequency    Vitamin D deficiency     Past Surgical History:  Procedure Laterality Date   APPENDECTOMY  AS CHILD   CARDIOVASCULAR STRESS TEST  03-27-2012  DR CRENSHAW   LOW RISK LEXISCAN STUDY-- PROBABLE NORMAL PERFUSION AND SOFT TISSUE ATTENUATION/  NO ISCHEMIA/ EF 51%   CAROTID ENDARTERECTOMY Bilateral LEFT  11-12-2008  DR GREG HAYES   RIGHT ICA  2006  ([facility])   CYSTOSCOPY W/ RETROGRADES Bilateral 06/22/2021   Procedure: CYSTOSCOPY WITH RETROGRADE PYELOGRAM;  Surgeon: Franchot Gallo, MD;  Location: [facility];  Service: Urology;  Laterality: Bilateral;   CYSTOSCOPY WITH LITHOLAPAXY N/A 02/26/2013   Procedure: CYSTOSCOPY WITH LITHOLAPAXY;  Surgeon: Franchot Gallo, MD;  Location: [facility];  Service: Urology;  Laterality: N/A;   EYE SURGERY  Jan. 2016   cataract surgery both eyes   INGUINAL HERNIA REPAIR Right 11-08-2006   IR KYPHO EA ADDL LEVEL THORACIC OR LUMBAR  02/12/2021   IR RADIOLOGIST EVAL & MGMT  02/18/2021   MASS EXCISION N/A 03/03/2016   Procedure: EXCISION OF BACK  MASS;  Surgeon: Stark Klein,  MD;  Location: [facility];  Service: General;  Laterality: N/A;   MOHS SURGERY Left 1/ 2016   Dr Nevada Crane-- Basal cell   PROSTATE SURGERY     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-C N/A 06/22/2021   Procedure: TRANSURETHRAL RESECTION OF BLADDER TUMOR;  Surgeon: Franchot Gallo, MD;  Location: [facility];  Service: Urology;  Laterality: N/A;   TRANSURETHRAL RESECTION OF PROSTATE N/A 02/26/2013   Procedure: TRANSURETHRAL RESECTION OF THE PROSTATE WITH GYRUS INSTRUMENTS;  Surgeon: Franchot Gallo, MD;  Location: [facility];  Service: Urology;  Laterality: N/A;   TRANSURETHRAL RESECTION OF PROSTATE N/A 06/22/2021   Procedure: TRANSURETHRAL RESECTION OF THE PROSTATE (TURP);   Surgeon: Franchot Gallo, MD;  Location: [facility];  Service: Urology;  Laterality: N/A;   Family History:  Family History  Problem Relation Age of Onset   Heart disease Mother        CHF   Bipolar disorder Mother    Heart disease Father        CHF   Family Psychiatric  History:  Social History:  Social History   Substance and Sexual Activity  Alcohol Use Yes   Alcohol/week: 0.0 standard drinks of alcohol   Comment: Occasional     Social History   Substance and Sexual Activity  Drug Use No    Social History   Socioeconomic History   Marital status: Married    Spouse name: Not on file   Number of children: 2   Years of education: 12   Highest education level: High school graduate  Occupational History    Employer: Retired  Tobacco Use   Smoking status: Former    Packs/day: 2.00    Years: 40.00    Total pack years: 80.00    Types: Cigarettes    Quit date: 02/15/2005    Years since quitting: 16.9   Smokeless tobacco: Never  Vaping Use   Vaping Use: Never used  Substance and Sexual Activity   Alcohol use: Yes    Alcohol/week: 0.0 standard drinks of alcohol    Comment: Occasional   Drug use: No   Sexual activity: Yes    Partners: Female  Other Topics Concern   Not on file  Social History Narrative   Exercise--  Walks dogs everyday      LIves with wife , no stairs in home, caffeine - one cup coffee day, exercise - not much, Right handed, 12th grade, retired      One story home   Social Determinants of Health   Financial Resource Strain: Not on file  Food Insecurity: Not on file  Transportation Needs: Not on file  Physical Activity: Not on file  Stress: Not on file  Social Connections: Not on file   Additional Social History:                         Sleep: Good  Appetite:  Good  Current Medications: Current Outpatient Medications  Medication Sig Dispense Refill   hydrOXYzine (VISTARIL) 25 MG capsule Take 1 capsule  by mouth everyday at 3:30 PM as needed 60 capsule 2   amantadine (SYMMETREL) 100 MG capsule Take 1 capsule (100 mg total) by mouth daily. 90 capsule 1   amLODipine (NORVASC) 10 MG tablet Take 1 tablet (10 mg total) by mouth every morning. 90 tablet 0   carBAMazepine (TEGRETOL) 100 MG/5ML suspension Take 5 mLs (100 mg total) by  mouth 3 (three) times daily. 450 mL 12   Cholecalciferol (VITAMIN D) 50 MCG (2000 UT) CAPS Take 2,000 Units by mouth every morning. Chewy vitamin     clopidogrel (PLAVIX) 75 MG tablet Take 1 tablet (75 mg total) by mouth daily. 90 tablet 1   clotrimazole-betamethasone (LOTRISONE) cream Apply 1 Application on to the skin daily. 30 g 0   fluticasone (CUTIVATE) 3.35 % cream 1 application daily as needed (psoriasis).  3   hydrALAZINE (APRESOLINE) 25 MG tablet Take 1 tablet (25 mg total) by mouth every 8 (eight) hours. 270 tablet 1   LORazepam (ATIVAN) 0.5 MG tablet Take 1/2 tablet (0.25 mg total) by mouth daily. 30 tablet 4   LORazepam ER (LOREEV XR) 1 MG CS24 Take 1 mg (1 capsule)  by mouth at bedtime. 30 capsule 3   Multiple Vitamins-Minerals (ADULT ONE DAILY GUMMIES) CHEW Chew 1 tablet by mouth every morning.     pantoprazole (PROTONIX) 40 MG tablet Take 1 tablet (40 mg total) by mouth daily. 90 tablet 3   pantoprazole sodium (PROTONIX) 40 mg Place 40 mg into feeding tube daily. 30 packet 3   polyethylene glycol (MIRALAX / GLYCOLAX) 17 g packet Take 17 g by mouth daily. 14 each 0   tamsulosin (FLOMAX) 0.4 MG CAPS capsule Take 1 capsule (0.4 mg total) by mouth daily after supper. 90 capsule 0   traMADol (ULTRAM) 50 MG tablet Take 1 tablet (50 mg total) by mouth every 12 (twelve) hours as needed (back pain). 30 tablet    traZODone (DESYREL) 150 MG tablet Take 1/2 tablet (75 mg total) by mouth at bedtime. 30 tablet 0   triamcinolone cream (KENALOG) 0.1 % Apply 1 application topically 2 (two) times daily. 30 g 0   No current facility-administered medications for this visit.    Facility-Administered Medications Ordered in Other Visits  Medication Dose Route Frequency Provider Last Rate Last Admin   gemcitabine (GEMZAR) chemo syringe for bladder instillation 2,000 mg  2,000 mg Bladder Instillation Once Franchot Gallo, MD        Lab Results:  No results found. However, due to the size of the patient record, not all encounters were searched. Please check Results Review for a complete set of results.  Physical Findings: AIMS:  , ,  ,  ,    CIWA:    COWS:     Musculoskeletal: Strength & Muscle Tone: within normal limits Gait & Station: normal Patient leans: Right  Psychiatric Specialty Exam: ROS  There were no vitals taken for this visit.There is no height or weight on file to calculate BMI.  General Appearance: Casual  Eye Contact::  Good  Speech:  Clear and Coherent  Volume:  Normal  Mood:  Dysphoric and Euthymic  Affect:  Appropriate  Thought Process:  Coherent  Orientation:  Full (Time, Place, and Person)  Thought Content:  WDL  Suicidal Thoughts:  No  Homicidal Thoughts:  No  Memory:  NA  Judgement:  Good  Insight:  Fair  Psychomotor Activity:  Normal  Concentration:  Good  Recall:  Good  Fund of Knowledge:Good  Language: Good  Akathisia:  No  Handed:  Right  AIMS (if indicated):     Assets:  Desire for Improvement  ADL's:  Intact  Cognition: WNL  Sleep:      Treatment Plan Summary: 01/26/2022, 2:34 PM   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  We will slightly increase his Tegretol to taking 100 mg in  the morning 100 mg midday and 200 mg at night.  We will continue taking Ativan 0.25 mg in the morning and a 1 mg dosage of Loorev at night.  We will also continue taking trazodone 75 mg for insomnia.  Therefore her efforts are to increase his Tegretol but will also begin him on Vistaril 25 mg as needed.  He will take it every night at 4:00 or 3 and then take an extra 1 if he needs it.  He will return to see me in  5 to 6 weeks.  If this regime does not work we will increase Loorev to a dose of 2 mg.  The patient is sleeping well.  I think we will increase slowly all medications.  When he fell it was not because he was sedated or lost his balance.  He was struggling with his wife.

## 2022-01-26 NOTE — Patient Instructions (Signed)
Fall Prevention in the Home, Adult Falls can cause injuries and affect people of all ages. There are many simple things that you can do to make your home safe and to help prevent falls. Ask for help when making these changes, if needed. What actions can I take to prevent falls? General instructions Use good lighting in all rooms. Replace any light bulbs that burn out, turn on lights if it is dark, and use night-lights. Place frequently used items in easy-to-reach places. Lower the shelves around your home if necessary. Set up furniture so that there are clear paths around it. Avoid moving your furniture around. Remove throw rugs and other tripping hazards from the floor. Avoid walking on wet floors. Fix any uneven floor surfaces. Add color or contrast paint or tape to grab bars and handrails in your home. Place contrasting color strips on the first and last steps of staircases. When you use a stepladder, make sure that it is completely opened and that the sides and supports are firmly locked. Have someone hold the ladder while you are using it. Do not climb a closed stepladder. Know where your pets are when moving through your home. What can I do in the bathroom?     Keep the floor dry. Immediately clean up any water that is on the floor. Remove soap buildup in the tub or shower regularly. Use nonskid mats or decals on the floor of the tub or shower. Attach bath mats securely with double-sided, nonslip rug tape. If you need to sit down while you are in the shower, use a plastic, nonslip stool. Install grab bars by the toilet and in the tub and shower. Do not use towel bars as grab bars. What can I do in the bedroom? Make sure that a bedside light is easy to reach. Do not use oversized bedding that reaches the floor. Have a firm chair that has side arms to use for getting dressed. What can I do in the kitchen? Clean up any spills right away. If you need to reach for something above you,  use a sturdy step stool that has a grab bar. Keep electrical cables out of the way. Do not use floor polish or wax that makes floors slippery. If you must use wax, make sure that it is non-skid floor wax. What can I do with my stairs? Do not leave any items on the stairs. Make sure that you have a light switch at the top and the bottom of the stairs. Have them installed if you do not have them. Make sure that there are handrails on both sides of the stairs. Fix handrails that are broken or loose. Make sure that handrails are as long as the staircases. Install non-slip stair treads on all stairs in your home. Avoid having throw rugs at the top or bottom of stairs, or secure the rugs with carpet tape to prevent them from moving. Choose a carpet design that does not hide the edge of steps on the stairs. Check any carpeting to make sure that it is firmly attached to the stairs. Fix any carpet that is loose or worn. What can I do on the outside of my home? Use bright outdoor lighting. Regularly repair the edges of walkways and driveways and fix any cracks. Remove high doorway thresholds. Trim any shrubbery on the main path into your home. Regularly check that handrails are securely fastened and in good repair. Both sides of all steps should have handrails. Install guardrails along   the edges of any raised decks or porches. Clear walkways of debris and clutter, including tools and rocks. Have leaves, snow, and ice cleared regularly. Use sand or salt on walkways during winter months. In the garage, clean up any spills right away, including grease or oil spills. What other actions can I take? Wear closed-toe shoes that fit well and support your feet. Wear shoes that have rubber soles or low heels. Use mobility aids as needed, such as canes, walkers, scooters, and crutches. Review your medicines with your health care provider. Some medicines can cause dizziness or changes in blood pressure, which  increase your risk of falling. Talk with your health care provider about other ways that you can decrease your risk of falls. This may include working with a physical therapist or trainer to improve your strength, balance, and endurance. Where to find more information Centers for Disease Control and Prevention, [employer]: www.cdc.gov National Institute on Aging: www.nia.nih.gov Contact a health care provider if: You are afraid of falling at home. You feel weak, drowsy, or dizzy at home. You fall at home. Summary There are many simple things that you can do to make your home safe and to help prevent falls. Ways to make your home safe include removing tripping hazards and installing grab bars in the bathroom. Ask for help when making these changes in your home. This information is not intended to replace advice given to you by your health care provider. Make sure you discuss any questions you have with your health care provider. Document Revised: 03/02/2021 Document Reviewed: 01/02/2020 [employer] Patient Education  2023 [employer]  

## 2022-01-26 NOTE — Progress Notes (Addendum)
Subjective:   By signing my name below, I, William [name], attest that this documentation has been prepared under the direction and in the presence of Dr. Roma Schanz, DO. 01/26/2022    Patient ID: William [name], male    DOB: [birth year], 81 y.o.   MRN: [id number]  Chief Complaint  Patient presents with   Fall    Pt states falling on Saturday and states having left flank pain and back pain.     HPI Patient is in today for a office visit. His wife is present during this visit.   He fell yesterday while walking yesterday. He reports while walking he felt tired and fell backwards and towards the right on his buttocks and back. Since then his buttocks, back and left side ribs were hurting. His rib pain worsens when he burps or laughs. His wife is applying lidocaine patches to his back to manage his pain and found mild relief. He continued his physical therapy appointment after his fall yesterday.  He complains of pain in his left elbow at night while trying to sleep. He sleeps on his back. He has no pain with palpation or moving it around.  His wife reports he developed new moles on his back.    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   Mild, related to stroke history   Nocturia    Renal insufficiency 06/25/2013   S/P carotid endarterectomy    BILATERAL ICA--  PATENT PER DUPLEX  05-19-2012   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   Right calf   Urinary frequency    Vitamin D deficiency     Past Surgical History:  Procedure Laterality Date   APPENDECTOMY  AS CHILD   CARDIOVASCULAR STRESS TEST  03-27-2012  DR CRENSHAW   LOW RISK LEXISCAN STUDY-- PROBABLE NORMAL PERFUSION AND SOFT TISSUE ATTENUATION/  NO ISCHEMIA/ EF 51%   CAROTID ENDARTERECTOMY Bilateral LEFT  11-12-2008  DR GREG HAYES   RIGHT ICA  2006  ([facility])   CYSTOSCOPY W/ RETROGRADES Bilateral 06/22/2021   Procedure: CYSTOSCOPY WITH RETROGRADE PYELOGRAM;  Surgeon: Franchot Gallo, MD;  Location: [facility];  Service: Urology;  Laterality: Bilateral;   CYSTOSCOPY WITH LITHOLAPAXY N/A 02/26/2013   Procedure: CYSTOSCOPY WITH LITHOLAPAXY;  Surgeon: Franchot Gallo, MD;  Location: [facility];  Service: Urology;  Laterality: N/A;   EYE SURGERY  Jan. 2016   cataract surgery both eyes   INGUINAL HERNIA REPAIR Right 11-08-2006   IR KYPHO EA ADDL LEVEL THORACIC OR LUMBAR  02/12/2021   IR RADIOLOGIST EVAL & MGMT  02/18/2021   MASS EXCISION N/A 03/03/2016   Procedure: EXCISION OF BACK  MASS;  Surgeon: Stark Klein, MD;  Location: [place];  Service: General;  Laterality: N/A;  MOHS SURGERY Left 1/ 2016   Dr Nevada Crane-- Basal cell   PROSTATE SURGERY     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-C N/A 06/22/2021   Procedure: TRANSURETHRAL RESECTION OF BLADDER TUMOR;  Surgeon: Franchot Gallo, MD;  Location: [facility];  Service: Urology;  Laterality: N/A;   TRANSURETHRAL RESECTION OF PROSTATE N/A 02/26/2013   Procedure: TRANSURETHRAL RESECTION OF THE PROSTATE WITH GYRUS INSTRUMENTS;  Surgeon: Franchot Gallo, MD;  Location: [facility];  Service: Urology;  Laterality: N/A;   TRANSURETHRAL RESECTION OF PROSTATE N/A 06/22/2021   Procedure: TRANSURETHRAL RESECTION OF THE PROSTATE (TURP);  Surgeon: Franchot Gallo,  MD;  Location: [facility];  Service: Urology;  Laterality: N/A;    Family History  Problem Relation Age of Onset   Heart disease Mother        CHF   Bipolar disorder Mother    Heart disease Father        CHF    Social History   Socioeconomic History   Marital status: Married    Spouse name: Not on file   Number of children: 2   Years of education: 12   Highest education level: High school graduate  Occupational History    Employer: Retired  Tobacco Use   Smoking status: Former    Packs/day: 2.00    Years: 40.00    Total pack years: 80.00    Types: Cigarettes    Quit date: 02/15/2005    Years since quitting: 16.9   Smokeless tobacco: Never  Vaping Use   Vaping Use: Never used  Substance and Sexual Activity   Alcohol use: Yes    Alcohol/week: 0.0 standard drinks of alcohol    Comment: Occasional   Drug use: No   Sexual activity: Yes    Partners: Female  Other Topics Concern   Not on file  Social History Narrative   Exercise--  Walks dogs everyday      LIves with wife , no stairs in home, caffeine - one cup coffee day, exercise - not much, Right handed, 12th grade, retired      One story home   Social Determinants of Health   Financial Resource Strain: Not on file  Food Insecurity: Not on file  Transportation Needs: Not on file  Physical Activity: Not on file  Stress: Not on file  Social Connections: Not on file  Intimate Partner Violence: Not on file    Outpatient Medications Prior to Visit  Medication Sig Dispense Refill   amantadine (SYMMETREL) 100 MG capsule Take 1 capsule (100 mg total) by mouth daily. 90 capsule 1   amLODipine (NORVASC) 10 MG tablet Take 1 tablet (10 mg total) by mouth every morning. 90 tablet 0   carBAMazepine (TEGRETOL) 100 MG/5ML suspension Take 5 mLs (100 mg total) by mouth 3 (three) times daily. 450 mL 12   Cholecalciferol (VITAMIN D) 50 MCG (2000 UT) CAPS Take 2,000 Units by mouth every morning. Chewy vitamin      clopidogrel (PLAVIX) 75 MG tablet Take 1 tablet (75 mg total) by mouth daily. 90 tablet 1   clotrimazole-betamethasone (LOTRISONE) cream Apply 1 Application on to the skin daily. 30 g 0   fluticasone (CUTIVATE) 7.89 % cream 1 application daily as needed (psoriasis).  3   hydrALAZINE (APRESOLINE) 25 MG tablet Take 1 tablet (25 mg total) by mouth every 8 (eight) hours. 270 tablet 1   LORazepam (ATIVAN) 0.5 MG tablet Take 1/2 tablet (0.25 mg total)  by mouth daily. 30 tablet 0   LORazepam ER (LOREEV XR) 1 MG CS24 Take 1 mg (1 capsule)  by mouth at bedtime. 30 capsule 3   Multiple Vitamins-Minerals (ADULT ONE DAILY GUMMIES) CHEW Chew 1 tablet by mouth every morning.     pantoprazole (PROTONIX) 40 MG tablet Take 1 tablet (40 mg total) by mouth daily. 90 tablet 3   pantoprazole sodium (PROTONIX) 40 mg Place 40 mg into feeding tube daily. 30 packet 3   polyethylene glycol (MIRALAX / GLYCOLAX) 17 g packet Take 17 g by mouth daily. 14 each 0   tamsulosin (FLOMAX) 0.4 MG CAPS capsule Take 1 capsule (0.4 mg total) by mouth daily after supper. 90 capsule 0   traZODone (DESYREL) 150 MG tablet Take 1/2 tablet (75 mg total) by mouth at bedtime. 30 tablet 0   triamcinolone cream (KENALOG) 0.1 % Apply 1 application topically 2 (two) times daily. 30 g 0   traMADol (ULTRAM) 50 MG tablet Take 1 tablet (50 mg total) by mouth every 12 (twelve) hours as needed (back pain). 30 tablet    LORazepam (ATIVAN) 0.5 MG tablet Take 1/2 tablet by mouth every morning and take 1 tablet by mouth at bedtime 135 tablet 1   Facility-Administered Medications Prior to Visit  Medication Dose Route Frequency Provider Last Rate Last Admin   gemcitabine (GEMZAR) chemo syringe for bladder instillation 2,000 mg  2,000 mg Bladder Instillation Once Franchot Gallo, MD        Allergies  Allergen Reactions   Bee Venom Anaphylaxis   Strawberry Extract Hives   Latex Itching   Zetia [Ezetimibe] Other (See Comments)    Intolerance     Adhesive [Tape] Other (See Comments)    BLISTER   Statins Other (See Comments)    myalgias    Review of Systems  Musculoskeletal:  Positive for back pain.       (+)left rib pain (+)pain in buttocks (+)left elbow pain  Skin:        (+)new moles on back       Objective:    Physical Exam Constitutional:      General: He is not in acute distress.    Appearance: Normal appearance. He is not ill-appearing.  HENT:     Head: Normocephalic and atraumatic.     Right Ear: External ear normal.     Left Ear: External ear normal.  Eyes:     Extraocular Movements: Extraocular movements intact.     Pupils: Pupils are equal, round, and reactive to light.  Cardiovascular:     Rate and Rhythm: Normal rate and regular rhythm.     Heart sounds: Normal heart sounds. No murmur heard.    No gallop.  Pulmonary:     Effort: Pulmonary effort is normal. No respiratory distress.     Breath sounds: Normal breath sounds. No wheezing or rales.  Musculoskeletal:     Left elbow: No tenderness.     Comments: Left mid back tenderness with palpation No redness, no bruising  Skin:    General: Skin is warm and dry.  Neurological:     Mental Status: He is alert and oriented to person, place, and time.  Psychiatric:        Judgment: Judgment normal.     BP 132/82 (BP Location: Right Arm, Patient Position: Sitting, Cuff Size: Normal)   Pulse 73   Temp 97.7 F (36.5 C) (Oral)   Resp 20   Ht '5\' 11"'$  (1.803 m)  SpO2 99%   BMI 26.95 kg/m  Wt Readings from Last 3 Encounters:  01/04/22 193 lb 3.2 oz (87.6 kg)  12/31/21 193 lb (87.5 kg)  12/10/21 190 lb (86.2 kg)    Diabetic Foot Exam - Simple   No data filed    Lab Results  Component Value Date   WBC 7.3 12/10/2021   HGB 11.8 (L) 12/10/2021   HCT 35.5 (L) 12/10/2021   PLT 217.0 12/10/2021   GLUCOSE 102 (H) 12/25/2021   CHOL 202 (H) 12/10/2021   TRIG 95.0 12/10/2021   HDL 63.00 12/10/2021   LDLCALC 120 (H) 12/10/2021   ALT 17 12/25/2021    AST 14 12/25/2021   NA 142 12/25/2021   K 4.5 12/25/2021   CL 106 12/25/2021   CREATININE 1.71 (H) 12/25/2021   BUN 25 12/25/2021   CO2 26 12/25/2021   TSH 0.555 07/27/2021   PSA 2.10 12/24/2016   INR 1.0 07/27/2021   HGBA1C 5.3 07/27/2021   MICROALBUR 7.4 (H) 07/31/2020    Lab Results  Component Value Date   TSH 0.555 07/27/2021   Lab Results  Component Value Date   WBC 7.3 12/10/2021   HGB 11.8 (L) 12/10/2021   HCT 35.5 (L) 12/10/2021   MCV 96.6 12/10/2021   PLT 217.0 12/10/2021   Lab Results  Component Value Date   NA 142 12/25/2021   K 4.5 12/25/2021   CO2 26 12/25/2021   GLUCOSE 102 (H) 12/25/2021   BUN 25 12/25/2021   CREATININE 1.71 (H) 12/25/2021   BILITOT 0.3 12/25/2021   ALKPHOS 584 (H) 12/10/2021   AST 14 12/25/2021   ALT 17 12/25/2021   PROT 6.5 12/25/2021   ALBUMIN 4.0 12/10/2021   CALCIUM 8.8 12/25/2021   ANIONGAP 10 10/02/2021   GFR 37.90 (L) 12/10/2021   Lab Results  Component Value Date   CHOL 202 (H) 12/10/2021   Lab Results  Component Value Date   HDL 63.00 12/10/2021   Lab Results  Component Value Date   LDLCALC 120 (H) 12/10/2021   Lab Results  Component Value Date   TRIG 95.0 12/10/2021   Lab Results  Component Value Date   CHOLHDL 3 12/10/2021   Lab Results  Component Value Date   HGBA1C 5.3 07/27/2021       Assessment & Plan:   Problem List Items Addressed This Visit       Unprioritized   Rib pain - Primary   Relevant Medications   traMADol (ULTRAM) 50 MG tablet   Other Relevant Orders   DG Thoracic Spine 2 View   Mid back pain on left side    Check xray Tramadol for night time con't pr      Relevant Medications   traMADol (ULTRAM) 50 MG tablet   Other Relevant Orders   DG Thoracic Spine 2 View   DG Ribs Unilateral Left   Left elbow pain   Relevant Orders   DG Elbow Complete Left   Fall    con't with PT        Meds ordered this encounter  Medications   DISCONTD: traMADol (ULTRAM) 50 MG  tablet    Sig: Take 1 tablet (50 mg total) by mouth every 12 (twelve) hours as needed (back pain).    Dispense:  30 tablet   traMADol (ULTRAM) 50 MG tablet    Sig: Take 1 tablet (50 mg total) by mouth every 12 (twelve) hours as needed (back pain).    Dispense:  30  tablet    I, Ann Held, DO, personally preformed the services described in this documentation.  All medical record entries made by the scribe were at my direction and in my presence.  I have reviewed the chart and discharge instructions (if applicable) and agree that the record reflects my personal performance and is accurate and complete. 01/26/2022   I,William [name],acting as a scribe for Ann Held, DO.,have documented all relevant documentation on the behalf of Ann Held, DO,as directed by  Ann Held, DO while in the presence of Ann Held, DO.   Ann Held, DO

## 2022-01-26 NOTE — Assessment & Plan Note (Signed)
Check xray Tramadol for night  F/u prn

## 2022-01-26 NOTE — Assessment & Plan Note (Signed)
Check xray Tramadol for night time con't pr

## 2022-01-27 ENCOUNTER — Other Ambulatory Visit: Payer: Self-pay

## 2022-01-27 ENCOUNTER — Ambulatory Visit: Payer: PPO | Admitting: Physical Therapy

## 2022-01-27 ENCOUNTER — Telehealth: Payer: Self-pay | Admitting: Family Medicine

## 2022-01-27 DIAGNOSIS — R058 Other specified cough: Secondary | ICD-10-CM

## 2022-01-27 NOTE — Telephone Encounter (Signed)
[name], patient's wife, called to advise that he has been coughing up mucus this morning. Some had dark spots and some was greenish. Patient is complaining of not feeling well, he is feeling warm and his stomach and ribs are hurting. Wife not sure if it's a result of the fall or if it's something else. Please call her to discuss.

## 2022-01-27 NOTE — Telephone Encounter (Signed)
Spoke with pt's wife. She confirmed understanding and given imaging phone number. Cxr ordered.

## 2022-01-28 ENCOUNTER — Other Ambulatory Visit: Payer: Self-pay

## 2022-01-28 ENCOUNTER — Ambulatory Visit: Payer: PPO | Admitting: Occupational Therapy

## 2022-01-28 ENCOUNTER — Ambulatory Visit: Payer: PPO | Admitting: Physical Therapy

## 2022-01-28 ENCOUNTER — Ambulatory Visit (HOSPITAL_BASED_OUTPATIENT_CLINIC_OR_DEPARTMENT_OTHER)
Admission: RE | Admit: 2022-01-28 | Discharge: 2022-01-28 | Disposition: A | Discharge status: Home / Self Care | Payer: PPO | Source: Ambulatory Visit | Attending: Family Medicine | Admitting: Family Medicine

## 2022-01-28 DIAGNOSIS — R059 Cough, unspecified: Secondary | ICD-10-CM | POA: Diagnosis not present

## 2022-01-28 DIAGNOSIS — S42002A Fracture of unspecified part of left clavicle, initial encounter for closed fracture: Secondary | ICD-10-CM | POA: Diagnosis not present

## 2022-01-28 DIAGNOSIS — I69359 Hemiplegia and hemiparesis following cerebral infarction affecting unspecified side: Secondary | ICD-10-CM | POA: Diagnosis not present

## 2022-01-28 DIAGNOSIS — R058 Other specified cough: Secondary | ICD-10-CM | POA: Insufficient documentation

## 2022-01-28 DIAGNOSIS — G8929 Other chronic pain: Secondary | ICD-10-CM | POA: Diagnosis not present

## 2022-01-28 DIAGNOSIS — R0902 Hypoxemia: Secondary | ICD-10-CM | POA: Diagnosis not present

## 2022-01-28 DIAGNOSIS — R0781 Pleurodynia: Secondary | ICD-10-CM

## 2022-01-28 DIAGNOSIS — M5430 Sciatica, unspecified side: Secondary | ICD-10-CM | POA: Diagnosis not present

## 2022-01-28 DIAGNOSIS — Z8781 Personal history of (healed) traumatic fracture: Secondary | ICD-10-CM

## 2022-01-28 DIAGNOSIS — S32019D Unspecified fracture of first lumbar vertebra, subsequent encounter for fracture with routine healing: Secondary | ICD-10-CM | POA: Diagnosis not present

## 2022-02-01 ENCOUNTER — Encounter: Payer: Self-pay | Admitting: Physical Therapy

## 2022-02-01 ENCOUNTER — Ambulatory Visit: Payer: PPO | Admitting: Physical Therapy

## 2022-02-01 ENCOUNTER — Ambulatory Visit: Payer: PPO | Admitting: Occupational Therapy

## 2022-02-01 DIAGNOSIS — R41840 Attention and concentration deficit: Secondary | ICD-10-CM | POA: Diagnosis not present

## 2022-02-01 DIAGNOSIS — Z9181 History of falling: Secondary | ICD-10-CM

## 2022-02-01 DIAGNOSIS — R2689 Other abnormalities of gait and mobility: Secondary | ICD-10-CM

## 2022-02-01 DIAGNOSIS — M6281 Muscle weakness (generalized): Secondary | ICD-10-CM

## 2022-02-01 DIAGNOSIS — I69318 Other symptoms and signs involving cognitive functions following cerebral infarction: Secondary | ICD-10-CM

## 2022-02-01 DIAGNOSIS — M25512 Pain in left shoulder: Secondary | ICD-10-CM

## 2022-02-01 DIAGNOSIS — R296 Repeated falls: Secondary | ICD-10-CM

## 2022-02-01 DIAGNOSIS — R262 Difficulty in walking, not elsewhere classified: Secondary | ICD-10-CM

## 2022-02-01 DIAGNOSIS — R278 Other lack of coordination: Secondary | ICD-10-CM

## 2022-02-01 NOTE — Therapy (Unsigned)
OUTPATIENT OCCUPATIONAL THERAPY TREATMENT NOTE   Patient Name: William [name] MRN: [id number] DOB:06/13/1941, 81 y.o., male Today's Date: 02/01/2022  PCP: William Schanz, DO REFERRING PROVIDER: Carollee [name], William Apa, DO    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   Mild, related to stroke history   Nocturia    Renal insufficiency 06/25/2013   S/P carotid endarterectomy    BILATERAL ICA--  PATENT PER DUPLEX  05-19-2012   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   Right calf   Urinary frequency    Vitamin D deficiency    Past Surgical History:  Procedure Laterality Date   APPENDECTOMY  AS CHILD   CARDIOVASCULAR STRESS TEST  03-27-2012  DR CRENSHAW   LOW RISK LEXISCAN STUDY-- PROBABLE NORMAL PERFUSION AND SOFT TISSUE ATTENUATION/  NO ISCHEMIA/ EF 51%   CAROTID ENDARTERECTOMY Bilateral LEFT  11-12-2008  DR GREG HAYES   RIGHT ICA  2006  ([facility])   CYSTOSCOPY W/ RETROGRADES Bilateral 06/22/2021   Procedure: CYSTOSCOPY WITH RETROGRADE PYELOGRAM;  Surgeon: Franchot Gallo, MD;  Location: [facility];  Service: Urology;  Laterality: Bilateral;   CYSTOSCOPY WITH LITHOLAPAXY N/A 02/26/2013   Procedure: CYSTOSCOPY WITH LITHOLAPAXY;  Surgeon: Franchot Gallo, MD;  Location: [facility];  Service: Urology;  Laterality: N/A;   EYE SURGERY   Jan. 2016   cataract surgery both eyes   INGUINAL HERNIA REPAIR Right 11-08-2006   IR KYPHO EA ADDL LEVEL THORACIC OR LUMBAR  02/12/2021   IR RADIOLOGIST EVAL & MGMT  02/18/2021   MASS EXCISION N/A 03/03/2016   Procedure: EXCISION OF BACK  MASS;  Surgeon: Stark Klein, MD;  Location: [place];  Service: General;  Laterality: N/A;   MOHS SURGERY Left 1/ 2016   Dr Nevada Crane-- Basal cell   PROSTATE SURGERY     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-C N/A 06/22/2021   Procedure: TRANSURETHRAL RESECTION OF BLADDER TUMOR;  Surgeon: Franchot Gallo, MD;  Location: [facility];  Service: Urology;  Laterality: N/A;   TRANSURETHRAL RESECTION OF PROSTATE N/A 02/26/2013   Procedure: TRANSURETHRAL RESECTION OF THE PROSTATE WITH GYRUS INSTRUMENTS;  Surgeon: Franchot Gallo, MD;  Location: [facility];  Service: Urology;  Laterality: N/A;   TRANSURETHRAL RESECTION OF PROSTATE N/A 06/22/2021   Procedure: TRANSURETHRAL RESECTION OF THE PROSTATE (TURP);  Surgeon: Franchot Gallo, MD;  Location: [facility];  Service: Urology;  Laterality: N/A;   Patient Active Problem List   Diagnosis Date Noted   Left elbow pain 01/26/2022   Mid back pain on left side 01/26/2022   Rib pain 01/26/2022   Acute pain of left shoulder 11/12/2021   Leukocytes in urine 11/12/2021   Urinary frequency 11/12/2021   Thrush 10/08/2021   Hemiplegia,  dominant side S/P CVA (cerebrovascular accident) (William [name]) 09/11/2021   Insomnia    Prediabetes    Chronic kidney disease (CKD), stage IV (severe) (HCC)    Basal ganglia infarction (William [name]) 07/29/2021   Transaminitis 07/27/2021   UTI (urinary tract infection) 07/27/2021   CVA (cerebral vascular accident) (William [name]) 07/27/2021   Fall 07/27/2021   Hyperglycemia 07/27/2021   CKD (chronic kidney disease), stage IV (William [name]) 07/27/2021   Cholelithiasis 07/27/2021   Hypoxia 07/27/2021   Nausea and vomiting 91/79/1505   Acute  metabolic encephalopathy 69/79/4801   Normocytic anemia 07/27/2021   Chronic back pain 07/27/2021   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William [name]) 06/22/2021   Closed fracture of first lumbar vertebra with routine healing 02/03/2021   Closed fracture of multiple ribs 11/18/2020   Anxiety 01/29/2020   Leg pain, bilateral 01/29/2020   Ingrown toenail 07/13/2019   Lumbar spondylosis 05/02/2018   Pain in left knee 03/09/2018   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   Trochanteric bursitis of left hip 01/16/2018   Preventative health care 09/26/2017   HTN (hypertension) 07/19/2015   Hyperlipidemia 07/19/2015   Great toe pain 02/11/2014   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   Obesity (BMI 30-39.9) 06/25/2013   Renal insufficiency 06/25/2013   Weakness of left arm 06/25/2013   Sebaceous cyst 03/03/2011   Sprain of lumbar region 07/31/2010   Rib pain, left 08/29/2009   Carotid artery stenosis, asymptomatic, bilateral 05/02/2009   Eczema, atopic 05/31/2008   Vitamin D deficiency 03/01/2008   BPH (benign prostatic hyperplasia) 08/06/2007   Fasting hyperglycemia 12/21/2006   History of right MCA infarct 2006    ONSET DATE: 07/27/21  REFERRING DIAG: R41.4 (ICD-10-CM) - Hemi-inattention I63.9 (ICD-10-CM) - Cerebrovascular accident (CVA), unspecified mechanism (William [name])   THERAPY DIAG:  Attention and concentration deficit  Other lack of coordination  History of falling  Other symptoms and signs involving cognitive functions following cerebral infarction  Other abnormalities of gait and mobility  PERTINENT HISTORY: L basal ganglia and corona radiata infarct 07/27/21 (had documented AMS for about 5 days prior w/ hospital work-up including MRI negative and symptoms attributed to a UTI); L lower homonymous quadrantanopia. 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 Yes: NPRS scale: 5-7/10 Pain location: back; L side of ribs Pain description: "it hurts;" throbbing Aggravating factors: movement Relieving factors: pain patch; ice   OBJECTIVE:   TODAY'S TREATMENT - 02/01/22:  Fall Prevention/ Functional Mobility OT reviewed fall prevention considerations w/ functional mobility, particularly outside the home. Practiced w/ RW, ambulating around clinic w/ close SPV, about 150 ft. OT added 10 lb wrist weight to L side to provide additional input through LUE and prevent lifting of L side of walker when walking w/ positive results  Functional Activities Reviewed participation in functional activities including: simple meal prep, bed mobility, UB/LB dressing, shaving, brushing teeth, toileting, and positioning during meals in order to identify functional tasks pt continues to experience difficulty with and will benefit from addressing in skilled OT.  Meals/self-feeding - able to make his own coffee but has been more difficult this week, work on  spreading condiments on bread, swivel chair at table working well; Bed mobility - has a bed rail that is moveable, bed ladder working well; Dressing - donning overhead shirt, threading long pants as pt is having success w/ shorts; Grooming - difficulty getting toothpaste on toothbrush, shaving is better Toileting - consistency/schedule working better Leisure - perseverates pushing TV remote buttons, answering the phone  Buttoning Practiced buttoning shirt w/ pt demonstrating significant difficulty w/ asymmetrical bilateral coordination and sustained attention/attention to task limiting to success. Pt required Max A/HOHA to complete 1 button. OT then  incorporated button hook w/ positive results; able to complete 3 buttons using AE w/ Mod A    PATIENT EDUCATION: Ongoing condition-specific education related to therapeutic interventions completed this session Person educated: Patient and Spouse Education method: Explanation Education comprehension: verbalized understanding   GOALS: Goals reviewed with patient? Yes   SHORT TERM GOALS: Target date: 12/29/2021   STG   Status:  1 Pt/caregiver will verbalize understanding of visual perceptual compensatory strategies Baseline: Decreased knowledge of visual deficits and strategies Met - 12/31/21  2 Pt will complete symbol cancellation task w/ at least 50% accuracy using visual compensatory strategies to indicate improved attention Baseline: 40% accuracy w/ multiple errors Met - 11/17/21  3 Pt will participate in bilateral coordination activity w/ no more than 3 cues for incorporation of LUE Baseline: L-sided inattention Met - 12/22/21: completed task w/ no cues for bil integration      LONG TERM GOALS: Target date: 01/13/2022   LTG   Status:  1 Pt will be able to don overhead shirt w/ SPV and verbal/gestural cues prn in at least 1 attempt Baseline: Min A to don/doff shirt sleeve on L side Progressing  2 Pt will be able to thread bottoms on BLEs or don socks w/ Mod A Baseline: Max A for LB dressing Met - 01/07/22   Pt will demonstrate donning bottoms, incorporating use of AE prn, w/ SPV for safety in at least 2 attempts Baseline: Min A to thread BLE on 01/07/22 Met - 01/21/22  3 Pt will demonstrate supine-to-sit transition w/ cues from caregiver and incorporation of AE/DME prn Baseline: Mod-Max A w/ bed mobility Met - 12/03/21; SPV w/ bed ladder and cues  4 Pt will be able to use TV remote (or adaptive remote) to complete 1 task (e.g., turn TV on/of, turn volume up/down) w/ SPV Baseline: Difficulty  Not Progressing - d/c due to continued difficulty and agitation w/ task at home  5 Pt will increase  Barthel Index score by at least 5 to indicate improved level of participation in BADLs Baseline: 55/100 Progressing  6 Pt will be able to make a peanut butter and jelly sandwich w/ SPV and verbal/tactile cues prn Progressing     ASSESSMENT:   CLINICAL IMPRESSION: Due to increased falls (2 outside in the past week), OT problem-solved w/ pt and his wife to trial AD to increase safety. Previously when attempting to use standard RW, pt lifts L side, having difficulty maintaining all 4 points of contact due to L-sided inattention. OT incorporated weight on L side of walker to prevent this w/ positive results, but pt continued to require significant cues for pacing and to pick up feet. OT also reviewed current level of participation in functional activities, discussing potential benefit of decreasing OT to 1x/week considering pt's spouse's understanding learned strategies and to allow for independent practice at home; pt and spouse verbalized understanding. Will trial 1x/week frequency next week and  closely monitor for appropriateness. Pt continues to benefit from incorporation of assistance for errorless learning prn, auditory/verbal/tactile cues, and immediate feedback and knowledge of results throughout based on current EBP for NCD.   PERFORMANCE DEFICITS in functional skills including ADLs, IADLs, coordination, dexterity, William, GMC, mobility, balance, and vision, cognitive skills including attention, memory, orientation, problem solving, safety awareness, sequencing, and temperament/personality, and psychosocial skills including environmental adaptation, interpersonal interactions, and routines and behaviors.    IMPAIRMENTS are limiting patient from ADLs, IADLs, rest and sleep, leisure, and social participation.    COMORBIDITIES may have co-morbidities that affects occupational performance. Patient will benefit from skilled OT to address above impairments and improve overall function.     PLAN: OT  FREQUENCY: 2x/week   OT DURATION: 10 weeks   PLANNED INTERVENTIONS: self care/ADL training, therapeutic exercise, therapeutic activity, neuromuscular re-education, balance training, functional mobility training, aquatic therapy, splinting, moist heat, cryotherapy, patient/family education, cognitive remediation/compensation, visual/perceptual remediation/compensation, psychosocial skills training, energy conservation, and DME and/or AE instructions   RECOMMENDED OTHER SERVICES: Currently receiving PT services at this location   CONSULTED AND AGREED WITH PLAN OF CARE: Patient and family member/caregiver   PLAN FOR NEXT SESSION: Preparatory exercise(s) for L-sided attention, proprioception and kinesthetic sense prior to practice of ADL tasks; continue to address bilateral coordination and attention to task   Kathrine Cords, MSOT, OTR/L 02/01/22, 4:06 PM

## 2022-02-01 NOTE — Therapy (Signed)
[place]. [place], [name], [address] Phone: [phone]   Fax:  [phone]  Physical Therapy Treatment  Patient Details  Name: William [name] MRN: [id number] Date of Birth: [birth year] Referring Provider (PT): Supple   Encounter Date: 02/01/2022   PT End of Session - 02/01/22 1447     Visit Number 34    Date for PT Re-Evaluation 03/04/22    Authorization Type HTA    PT Start Time 6962    PT Stop Time 9528    PT Time Calculation (min) 47 min    Activity Tolerance Patient tolerated treatment well    Behavior During Therapy Flat affect;Restless             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   Mild, related to stroke history   Nocturia    Renal insufficiency 06/25/2013   S/P carotid endarterectomy    BILATERAL ICA--  PATENT PER DUPLEX  05-19-2012   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   Right calf   Urinary frequency    Vitamin D deficiency     Past Surgical History:  Procedure Laterality Date   APPENDECTOMY  AS CHILD   CARDIOVASCULAR STRESS TEST  03-27-2012  DR CRENSHAW   LOW RISK LEXISCAN STUDY-- PROBABLE NORMAL PERFUSION AND SOFT TISSUE ATTENUATION/  NO ISCHEMIA/ EF 51%   CAROTID ENDARTERECTOMY Bilateral LEFT  11-12-2008  DR GREG HAYES   RIGHT ICA  2006  ([facility])    CYSTOSCOPY W/ RETROGRADES Bilateral 06/22/2021   Procedure: CYSTOSCOPY WITH RETROGRADE PYELOGRAM;  Surgeon: Franchot Gallo, MD;  Location: [facility];  Service: Urology;  Laterality: Bilateral;   CYSTOSCOPY WITH LITHOLAPAXY N/A 02/26/2013   Procedure: CYSTOSCOPY WITH LITHOLAPAXY;  Surgeon: Franchot Gallo, MD;  Location: [facility];  Service: Urology;  Laterality: N/A;   EYE SURGERY  Jan. 2016   cataract surgery both eyes   INGUINAL HERNIA REPAIR Right 11-08-2006   IR KYPHO EA ADDL LEVEL THORACIC OR LUMBAR  02/12/2021   IR RADIOLOGIST EVAL & MGMT  02/18/2021   MASS EXCISION N/A 03/03/2016   Procedure: EXCISION OF BACK  MASS;  Surgeon: Stark Klein, MD;  Location: [place];  Service: General;  Laterality: N/A;   MOHS SURGERY Left 1/ 2016   Dr Nevada Crane-- Basal cell   PROSTATE SURGERY     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-C N/A 06/22/2021   Procedure: TRANSURETHRAL RESECTION OF BLADDER TUMOR;  Surgeon: Franchot Gallo, MD;  Location: [facility];  Service: Urology;  Laterality: N/A;   TRANSURETHRAL RESECTION OF PROSTATE N/A 02/26/2013   Procedure: TRANSURETHRAL RESECTION OF THE PROSTATE WITH GYRUS INSTRUMENTS;  Surgeon: Franchot Gallo, MD;  Location: [facility];  Service:  Urology;  Laterality: N/A;   TRANSURETHRAL RESECTION OF PROSTATE N/A 06/22/2021   Procedure: TRANSURETHRAL RESECTION OF THE PROSTATE (TURP);  Surgeon: Franchot Gallo, MD;  Location: [facility];  Service: Urology;  Laterality: N/A;    There were no vitals filed for this visit.   Subjective Assessment - 02/01/22 1448     Subjective Patient swife reports that last week on Thursday they did an x-ray of the elbow and found a clavicle fracture, he is in a sling now, he reports pain    Currently in Pain? Yes    Pain Score 5     Pain Location Shoulder    Pain Orientation Left    Aggravating Factors  from falling                                [facility] Adult PT Treatment/Exercise - 02/01/22 0001       Transfers   Comments on mat table side scooting each direction      Ambulation/Gait   Gait Comments gait with gait belt working on big steps and slowing donw when he gets out of control      High Level Balance   High Level Balance Activities Side stepping;Backward walking;Direction changes    High Level Balance Comments volley ball seated, walking ans stopping, changes of pace      Lumbar Exercises: Aerobic   Nustep levewl 5 x 54mnutes, no arms      Lumbar Exercises: Machines for Strengthening   Cybex Knee Extension 5# left only 3x10    Cybex Knee Flexion 10# left only 3x10      Lumbar Exercises: Standing   Other Standing Lumbar Exercises hip abduction marches and extension 2.5# 2x10      Lumbar Exercises: Seated   Other Seated Lumbar Exercises sit to stand x 10 with minimal assist                       PT Short Term Goals - 12/14/21 1451       PT SHORT TERM GOAL #1   Title independent with initial HEP    Time 2    Period Weeks    Status On-going               PT Long Term Goals - 02/01/22 1611       PT LONG TERM GOAL #1   Title Understand fall risks, the importance of exercise and flexibility to health and function    Status Achieved      PT LONG TERM GOAL #2   Title decrease TUG time to 13 seconds    Status Partially Met      PT LONG TERM GOAL #3   Title walk around our building without rest     Status On-going      PT LONG TERM GOAL #4   Title increase LE strength to 4+/5    Status On-going      PT LONG TERM GOAL #5   Title increase Berg blance test score to 46/56    Status On-going      PT LONG TERM GOAL #6   Title decrease left shoulder pain 50%    Status Partially Met      PT LONG TERM GOAL #7   Title able to get up from supine and from sitting without help    Status Partially Met  Plan -  02/01/22 1612     Clinical Impression Statement Patient was diagnosed with a fracture of the left clavicle last week, he is in a sling today, he does report pain, I did add starting and stopping and walking fast and stopping to see if this helps with balance.He did well with this, needs cues to pay attention and to do full ROM exercises    PT Next Visit Plan be careful with the left arm/shoulder    Consulted and Agree with Plan of Care Patient             Patient will benefit from skilled therapeutic intervention in order to improve the following deficits and impairments:  Abnormal gait, Decreased coordination, Decreased range of motion, Difficulty walking, Increased muscle spasms, Cardiopulmonary status limiting activity, Decreased endurance, Decreased activity tolerance, Pain, Impaired flexibility, Decreased balance, Decreased mobility, Decreased strength, Postural dysfunction, Increased edema  Visit Diagnosis: History of falling  Other symptoms and signs involving cognitive functions following cerebral infarction  Other abnormalities of gait and mobility  Difficulty in walking, not elsewhere classified  Repeated falls  Muscle weakness (generalized)  Acute pain of left shoulder     Problem List Patient Active Problem List   Diagnosis Date Noted   Left elbow pain 01/26/2022   Mid back pain on left side 01/26/2022   Rib pain 01/26/2022   Acute pain of left shoulder 11/12/2021   Leukocytes in urine 11/12/2021   Urinary frequency 11/12/2021   Thrush 10/08/2021   Hemiplegia, dominant side S/P CVA (cerebrovascular accident) ([place]) 09/11/2021   Insomnia    Prediabetes    Chronic kidney disease (CKD), stage IV (severe) (HCC)    Basal ganglia infarction ([place]) 07/29/2021   Transaminitis 07/27/2021   UTI (urinary tract infection) 07/27/2021   CVA (cerebral vascular accident) ([place]) 07/27/2021   Fall 07/27/2021   Hyperglycemia 07/27/2021   CKD (chronic kidney disease), stage  IV ([place]) 07/27/2021   Cholelithiasis 07/27/2021   Hypoxia 07/27/2021   Nausea and vomiting 47/02/2956   Acute metabolic encephalopathy 47/34/0370   Normocytic anemia 07/27/2021   Chronic back pain 07/27/2021   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[place]) 06/22/2021   Closed fracture of first lumbar vertebra with routine healing 02/03/2021   Closed fracture of multiple ribs 11/18/2020   Anxiety 01/29/2020   Leg pain, bilateral 01/29/2020   Ingrown toenail 07/13/2019   Lumbar spondylosis 05/02/2018   Pain in left knee 03/09/2018   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   Trochanteric bursitis of left hip 01/16/2018   Preventative health care 09/26/2017   HTN (hypertension) 07/19/2015   Hyperlipidemia 07/19/2015   Great toe pain 02/11/2014   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 [place], [name], [address] Phone: [phone]   Fax:  [phone]  Name: ABHIRAM CRIADO MRN: [id number] Date of Birth: 01-03-1941

## 2022-02-03 ENCOUNTER — Encounter: Payer: Self-pay | Admitting: Physical Therapy

## 2022-02-03 ENCOUNTER — Other Ambulatory Visit (HOSPITAL_BASED_OUTPATIENT_CLINIC_OR_DEPARTMENT_OTHER): Payer: Self-pay

## 2022-02-03 ENCOUNTER — Other Ambulatory Visit: Payer: Self-pay | Admitting: Family Medicine

## 2022-02-03 ENCOUNTER — Ambulatory Visit: Payer: PPO | Admitting: Physical Therapy

## 2022-02-03 DIAGNOSIS — R41840 Attention and concentration deficit: Secondary | ICD-10-CM | POA: Diagnosis not present

## 2022-02-03 DIAGNOSIS — Z9181 History of falling: Secondary | ICD-10-CM

## 2022-02-03 DIAGNOSIS — R278 Other lack of coordination: Secondary | ICD-10-CM

## 2022-02-03 DIAGNOSIS — R2689 Other abnormalities of gait and mobility: Secondary | ICD-10-CM

## 2022-02-03 DIAGNOSIS — R262 Difficulty in walking, not elsewhere classified: Secondary | ICD-10-CM

## 2022-02-03 DIAGNOSIS — R296 Repeated falls: Secondary | ICD-10-CM

## 2022-02-03 DIAGNOSIS — M6281 Muscle weakness (generalized): Secondary | ICD-10-CM

## 2022-02-03 DIAGNOSIS — S2249XD Multiple fractures of ribs, unspecified side, subsequent encounter for fracture with routine healing: Secondary | ICD-10-CM

## 2022-02-03 DIAGNOSIS — I69354 Hemiplegia and hemiparesis following cerebral infarction affecting left non-dominant side: Secondary | ICD-10-CM

## 2022-02-03 MED ORDER — AMLODIPINE BESYLATE 10 MG PO TABS
10.0000 mg | ORAL_TABLET | Freq: Every morning | ORAL | 0 refills | Status: DC
  Filled 2022-02-03: qty 90, 90d supply, fill #0

## 2022-02-03 NOTE — Therapy (Signed)
[facility] [place], [name], [address] Phone: [phone]   Fax:  [phone]  Physical Therapy Treatment  Patient Details  Name: William [name] MRN: [id number] Date of Birth: [birth year] Referring Provider (PT): Supple   Encounter Date: 02/03/2022   PT End of Session - 02/03/22 1628     Visit Number 35    Date for PT Re-Evaluation 03/04/22    Authorization Type HTA    PT Start Time 1500    PT Stop Time 1540    PT Time Calculation (min) 40 min    Activity Tolerance Patient tolerated treatment well    Behavior During Therapy Flat affect;Restless              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   Mild, related to stroke history   Nocturia    Renal insufficiency 06/25/2013   S/P carotid endarterectomy    BILATERAL ICA--  PATENT PER DUPLEX  05-19-2012   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   Right calf   Urinary frequency    Vitamin D deficiency     Past Surgical History:  Procedure Laterality Date   APPENDECTOMY  AS CHILD   CARDIOVASCULAR STRESS TEST  03-27-2012  DR CRENSHAW   LOW RISK LEXISCAN STUDY-- PROBABLE NORMAL PERFUSION AND SOFT TISSUE ATTENUATION/  NO ISCHEMIA/ EF 51%   CAROTID ENDARTERECTOMY Bilateral LEFT  11-12-2008  DR GREG HAYES   RIGHT ICA  2006  ([facility])    CYSTOSCOPY W/ RETROGRADES Bilateral 06/22/2021   Procedure: CYSTOSCOPY WITH RETROGRADE PYELOGRAM;  Surgeon: Franchot Gallo, MD;  Location: [facility];  Service: Urology;  Laterality: Bilateral;   CYSTOSCOPY WITH LITHOLAPAXY N/A 02/26/2013   Procedure: CYSTOSCOPY WITH LITHOLAPAXY;  Surgeon: Franchot Gallo, MD;  Location[facility];  Service: Urology;  Laterality: N/A;   EYE SURGERY  Jan. 2016   cataract surgery both eyes   INGUINAL HERNIA REPAIR Right 11-08-2006   IR KYPHO EA ADDL LEVEL THORACIC OR LUMBAR  02/12/2021   IR RADIOLOGIST EVAL & MGMT  02/18/2021   MASS EXCISION N/A 03/03/2016   Procedure: EXCISION OF BACK  MASS;  Surgeon: Stark Klein, MD;  Location: [facility];  Service: General;  Laterality: N/A;   MOHS SURGERY Left 1/ 2016   Dr Nevada Crane-- Basal cell   PROSTATE SURGERY     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-C N/A 06/22/2021   Procedure: TRANSURETHRAL RESECTION OF BLADDER TUMOR;  Surgeon: Franchot Gallo, MD;  Location: [facility];  Service: Urology;  Laterality: N/A;   TRANSURETHRAL RESECTION OF PROSTATE N/A 02/26/2013   Procedure: TRANSURETHRAL RESECTION OF THE PROSTATE WITH GYRUS INSTRUMENTS;  Surgeon: Franchot Gallo, MD;  Location: [facility];  Service: Urology;  Laterality: N/A;   TRANSURETHRAL RESECTION OF PROSTATE N/A 06/22/2021   Procedure: TRANSURETHRAL RESECTION OF THE PROSTATE (TURP);  Surgeon: Franchot Gallo, MD;  Location: [facility];  Service: Urology;  Laterality: N/A;    There were no vitals filed for this visit.                       [facility] Adult PT Treatment/Exercise - 02/03/22 0001       Ambulation/Gait   Ambulation/Gait Yes    Ambulation/Gait Assistance 4: Min guard    Ambulation Distance (Feet) 150 Feet    Gait Comments Floor ladder, walking through with quick steps, 1 in each square, also 4 square stepping with the  floor ladder.      Lumbar Exercises: Aerobic   Nustep level 5 x 6 minutes, last minute > 70 SPM   in     Lumbar Exercises: Standing   Other Standing Lumbar Exercises Quick steps side sto side and forward and back across a line on floor, 45 seconds each, CGA.    Other Standing Lumbar Exercises Step ups onto airexpad and back down. 10 reps each, fatigued at the end, with min A.      Lumbar Exercises: Seated   Other Seated Lumbar Exercises B side steps on aires beam, 2 times each direction. Hed to keep patient from using LUE on rail.    Other Seated Lumbar Exercises Sit to stand while holding 2# ball in RUE, 2 x 10 reps, mod VC, min TC to lean forward enough to get started.                        PT Short Term Goals - 12/14/21 1451       PT SHORT TERM GOAL #1   Title independent with initial HEP    Time 2    Period Weeks    Status On-going               PT Long Term Goals - 02/01/22 1611       PT LONG TERM GOAL #1   Title Understand fall risks, the importance of exercise and flexibility to health and function    Status Achieved      PT LONG TERM GOAL #2   Title decrease TUG time to 13 seconds    Status Partially Met      PT LONG TERM GOAL #3   Title walk around our building without rest     Status On-going      PT LONG TERM GOAL #4   Title increase LE strength to 4+/5    Status On-going      PT LONG TERM GOAL #5   Title increase Berg blance test score to 46/56    Status On-going      PT LONG TERM GOAL #6   Title decrease left shoulder pain 50%    Status Partially Met      PT LONG TERM GOAL #7   Title able to get up from supine and from sitting without help    Status Partially Met                   Plan - 02/03/22 1628     Clinical Impression Statement Patient has difficulty not using LUE, even though it was in the sling. Treatment focused on balance and speed of movement to decrease fall risk. He had difficulty following  commands  for some activities, requires frequent re-direction, but participated well.    PT Treatment/Interventions ADLs/Self Care Home Management;Electrical Stimulation;Moist Heat;Gait training;Neuromuscular re-education;Balance training;Therapeutic exercise;Therapeutic activities;Functional mobility training;Stair training;Patient/family education;Manual techniques;Vasopneumatic Device;Ultrasound    PT Next Visit Plan be careful with the left arm/shoulder    Consulted and Agree with Plan of Care Patient              Patient will benefit from skilled therapeutic intervention in order to improve the following deficits and impairments:  Abnormal gait, Decreased coordination, Decreased range of motion, Difficulty walking, Increased muscle spasms, Cardiopulmonary status limiting activity, Decreased endurance, Decreased activity tolerance, Pain, Impaired flexibility, Decreased balance, Decreased mobility, Decreased strength, Postural dysfunction, Increased edema  Visit Diagnosis: History of falling  Muscle weakness (generalized)  Repeated falls  Other abnormalities of gait and mobility  Difficulty in walking, not elsewhere classified  Hemiplegia and hemiparesis following cerebral infarction affecting left non-dominant side (HCC)  Other lack of coordination     Problem List Patient Active Problem List   Diagnosis Date Noted   Left elbow pain 01/26/2022   Mid back pain on left side 01/26/2022   Rib pain 01/26/2022   Acute pain of left shoulder 11/12/2021   Leukocytes in urine 11/12/2021   Urinary frequency 11/12/2021   Thrush 10/08/2021   Hemiplegia, dominant side S/P CVA (cerebrovascular accident) ([place]) 09/11/2021   Insomnia    Prediabetes    Chronic kidney disease (CKD), stage IV (severe) (HCC)    Basal ganglia infarction ([place]) 07/29/2021   Transaminitis 07/27/2021   UTI (urinary tract infection) 07/27/2021   CVA (cerebral vascular accident) ([place]) 07/27/2021   Fall 07/27/2021    Hyperglycemia 07/27/2021   CKD (chronic kidney disease), stage IV ([place]) 07/27/2021   Cholelithiasis 07/27/2021   Hypoxia 07/27/2021   Nausea and vomiting 47/02/6282   Acute metabolic encephalopathy 66/29/4765   Normocytic anemia 07/27/2021   Chronic back pain 07/27/2021   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[place]) 06/22/2021   Closed fracture of first lumbar vertebra with routine healing 02/03/2021   Closed fracture of multiple ribs 11/18/2020   Anxiety 01/29/2020   Leg pain, bilateral 01/29/2020   Ingrown toenail 07/13/2019   Lumbar spondylosis 05/02/2018   Pain in left knee 03/09/2018   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   Trochanteric bursitis of left hip 01/16/2018   Preventative health care 09/26/2017   HTN (hypertension) 07/19/2015   Hyperlipidemia 07/19/2015   Great toe pain 02/11/2014   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 [place], [name], [address] Phone: [phone]   Fax:  [phone]  Name: William [name] MRN: [id number] Date of Birth: [birth year]

## 2022-02-04 ENCOUNTER — Encounter: Payer: Self-pay | Admitting: Physical Therapy

## 2022-02-04 ENCOUNTER — Other Ambulatory Visit (HOSPITAL_BASED_OUTPATIENT_CLINIC_OR_DEPARTMENT_OTHER): Payer: Self-pay

## 2022-02-04 ENCOUNTER — Ambulatory Visit: Payer: PPO | Admitting: Occupational Therapy

## 2022-02-04 ENCOUNTER — Ambulatory Visit: Payer: PPO | Admitting: Physical Therapy

## 2022-02-04 DIAGNOSIS — M25512 Pain in left shoulder: Secondary | ICD-10-CM | POA: Diagnosis not present

## 2022-02-04 DIAGNOSIS — R2689 Other abnormalities of gait and mobility: Secondary | ICD-10-CM

## 2022-02-04 DIAGNOSIS — Z9181 History of falling: Secondary | ICD-10-CM

## 2022-02-04 DIAGNOSIS — M6281 Muscle weakness (generalized): Secondary | ICD-10-CM

## 2022-02-04 DIAGNOSIS — R296 Repeated falls: Secondary | ICD-10-CM

## 2022-02-04 DIAGNOSIS — R262 Difficulty in walking, not elsewhere classified: Secondary | ICD-10-CM

## 2022-02-04 DIAGNOSIS — R41840 Attention and concentration deficit: Secondary | ICD-10-CM | POA: Diagnosis not present

## 2022-02-04 DIAGNOSIS — R278 Other lack of coordination: Secondary | ICD-10-CM

## 2022-02-04 DIAGNOSIS — I69354 Hemiplegia and hemiparesis following cerebral infarction affecting left non-dominant side: Secondary | ICD-10-CM

## 2022-02-04 MED ORDER — TRAMADOL HCL 50 MG PO TABS
50.0000 mg | ORAL_TABLET | Freq: Four times a day (QID) | ORAL | 0 refills | Status: DC
Start: 2022-02-04 — End: 2022-02-25
  Filled 2022-02-04: qty 20, 5d supply, fill #0

## 2022-02-04 NOTE — Therapy (Signed)
[facility]. [place], [name], [address] Phone: [phone]   Fax:  [phone]  Physical Therapy Treatment  Patient Details  Name: William [name] MRN: [id number] Date of Birth: Jun 11, 1941 Referring Provider (PT): Supple   Encounter Date: 02/04/2022   PT End of Session - 02/04/22 1604     Visit Number 36    Date for PT Re-Evaluation 03/04/22    Authorization Type HTA    PT Start Time 1532    PT Stop Time 1610    PT Time Calculation (min) 38 min    Equipment Utilized During Treatment Gait belt    Activity Tolerance Patient tolerated treatment well    Behavior During Therapy Flat affect;Restless             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   Mild, related to stroke history   Nocturia    Renal insufficiency 06/25/2013   S/P carotid endarterectomy    BILATERAL ICA--  PATENT PER DUPLEX  05-19-2012   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   Right calf   Urinary frequency    Vitamin D deficiency     Past Surgical History:  Procedure Laterality Date   APPENDECTOMY  AS CHILD   CARDIOVASCULAR STRESS TEST  03-27-2012  DR CRENSHAW   LOW RISK LEXISCAN STUDY-- PROBABLE NORMAL PERFUSION AND SOFT TISSUE ATTENUATION/  NO ISCHEMIA/ EF 51%   CAROTID ENDARTERECTOMY Bilateral LEFT  11-12-2008  DR  GREG HAYES   RIGHT ICA  2006  ([facility])   CYSTOSCOPY W/ RETROGRADES Bilateral 06/22/2021   Procedure: CYSTOSCOPY WITH RETROGRADE PYELOGRAM;  Surgeon: Franchot Gallo, MD;  Location: [facility];  Service: Urology;  Laterality: Bilateral;   CYSTOSCOPY WITH LITHOLAPAXY N/A 02/26/2013   Procedure: CYSTOSCOPY WITH LITHOLAPAXY;  Surgeon: Franchot Gallo, MD;  Location: [facility];  Service: Urology;  Laterality: N/A;   EYE SURGERY  Jan. 2016   cataract surgery both eyes   INGUINAL HERNIA REPAIR Right 11-08-2006   IR KYPHO EA ADDL LEVEL THORACIC OR LUMBAR  02/12/2021   IR RADIOLOGIST EVAL & MGMT  02/18/2021   MASS EXCISION N/A 03/03/2016   Procedure: EXCISION OF BACK  MASS;  Surgeon: Stark Klein, MD;  Location: [facility];  Service: General;  Laterality: N/A;   MOHS SURGERY Left 1/ 2016   Dr Nevada Crane-- Basal cell   PROSTATE SURGERY     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-C N/A 06/22/2021   Procedure: TRANSURETHRAL RESECTION OF BLADDER TUMOR;  Surgeon: Franchot Gallo, MD;  Location: [facility];  Service: Urology;  Laterality: N/A;   TRANSURETHRAL RESECTION OF PROSTATE N/A 02/26/2013   Procedure: TRANSURETHRAL RESECTION OF THE PROSTATE WITH GYRUS INSTRUMENTS;  Surgeon: Franchot Gallo,  MD;  Location: [place];  Service: Urology;  Laterality: N/A;   TRANSURETHRAL RESECTION OF PROSTATE N/A 06/22/2021   Procedure: TRANSURETHRAL RESECTION OF THE PROSTATE (TURP);  Surgeon: Franchot Gallo, MD;  Location: [facility];  Service: Urology;  Laterality: N/A;    There were no vitals filed for this visit.       [facility] PT Assessment - 02/04/22 0001       ROM / Strength   AROM / PROM / Strength PROM      AROM   Left Shoulder Extension 51 Degrees    Left Shoulder Flexion 74 Degrees    Left Shoulder ABduction 58 Degrees    Left Shoulder Internal Rotation --   [facility]   Left Shoulder External  Rotation 60 Degrees      PROM   PROM Assessment Site Shoulder    Right/Left Shoulder Left    Left Shoulder Extension 58 Degrees    Left Shoulder Flexion 84 Degrees    Left Shoulder ABduction 60 Degrees    Left Shoulder Internal Rotation --   [facility]   Left Shoulder External Rotation 61 Degrees      Special Tests    Special Tests Rotator Cuff Impingement;Laxity/Instability Tests    Rotator Cuff Impingment tests Michel Bickers test;Empty Can test      Hawkins-Kennedy test   Findings Unable to test    Side Left    Comments Unable to elevate to proper test position      Empty Can test   Findings Positive    Side Left                           [facility] Adult PT Treatment/Exercise - 02/04/22 0001       Lumbar Exercises: Standing   Other Standing Lumbar Exercises Alternating step taps on 4" step, Patient was fearful and kept holding the rail.                       PT Short Term Goals - 12/14/21 1451       PT SHORT TERM GOAL #1   Title independent with initial HEP    Time 2    Period Weeks    Status On-going               PT Long Term Goals - 02/04/22 1631       PT LONG TERM GOAL #1   Title Understand fall risks, the importance of exercise and flexibility to health and function    Status Achieved      PT LONG TERM GOAL #2   Title decrease TUG time to 13 seconds    Time 12    Period Weeks    Status On-going      PT LONG TERM GOAL #3   Title walk around our building without rest     Time 10    Period Weeks    Status On-going      PT LONG TERM GOAL #4   Title increase LE strength to 4+/5    Time 12    Period Weeks    Status On-going      PT LONG TERM GOAL #5   Title increase Berg blance test score to 46/56    Time 12    Period Weeks    Status On-going      Additional Long Term Goals   Additional Long Term Goals  Yes      PT LONG TERM GOAL #6   Title decrease left shoulder pain 50%    Time 12    Period Weeks    Status  On-going      PT LONG TERM GOAL #7   Title able to get up from supine and from sitting without help    Time 12    Status On-going      PT LONG TERM GOAL #8   Title Increase L shoulder flexion and abd to at least 120    Baseline AROM 74 flex, 58 abd    Time 4    Period Weeks    Status New    Target Date 03/04/22      PT LONG TERM GOAL  #9   TITLE Increase L shoulder strength to at least 3+/5 throughout    Baseline 3-/5    Time 4    Period Weeks    Status New    Target Date 03/04/22                   Plan - 02/04/22 1623     Clinical Impression Statement Patient arrived without sling. He saw the Dr who removed the sling and wrote a new order for L shoulder ROm and strengthening to avoid frozen shoulder. Paitent'a wife reports that after Dr appointment, patient fell at home, into a window on his L side. He does not report increased L clavicle pain, but does report pain in posterior shoulder near lateral scapular spine. Shoulder assessment performed with no signs of additional injury to clavicle. He does demonstrate decreased strength and ROM in L shoulder. UPOC with additional goals to address shoulder dysfunction.    Stability/Clinical Decision Making Evolving/Moderate complexity    Clinical Decision Making Low    Rehab Potential Fair    PT Frequency 2x / week    PT Duration 4 weeks    PT Treatment/Interventions ADLs/Self Care Home Management;Electrical Stimulation;Moist Heat;Gait training;Neuromuscular re-education;Balance training;Therapeutic exercise;Therapeutic activities;Functional mobility training;Stair training;Patient/family education;Manual techniques;Vasopneumatic Device;Ultrasound    PT Next Visit Plan Progressive strength/ROM L shoulder    Consulted and Agree with Plan of Care Patient;Family member/caregiver    Family Member Consulted wife [name]             Patient will benefit from skilled therapeutic intervention in order to improve the following  deficits and impairments:  Abnormal gait, Decreased coordination, Decreased range of motion, Difficulty walking, Increased muscle spasms, Cardiopulmonary status limiting activity, Decreased endurance, Decreased activity tolerance, Pain, Impaired flexibility, Decreased balance, Decreased mobility, Decreased strength, Postural dysfunction, Increased edema  Visit Diagnosis: History of falling  Muscle weakness (generalized)  Repeated falls  Other abnormalities of gait and mobility  Difficulty in walking, not elsewhere classified  Hemiplegia and hemiparesis following cerebral infarction affecting left non-dominant side (HCC)  Other lack of coordination  Acute pain of left shoulder     Problem List Patient Active Problem List   Diagnosis Date Noted   Left elbow pain 01/26/2022   Mid back pain on left side 01/26/2022   Rib pain 01/26/2022   Acute pain of left shoulder 11/12/2021   Leukocytes in urine 11/12/2021   Urinary frequency 11/12/2021   Thrush 10/08/2021   Hemiplegia, dominant side S/P CVA (cerebrovascular accident) ([place]) 09/11/2021   Insomnia    Prediabetes    Chronic kidney disease (CKD), stage IV (severe) (HCC)    Basal ganglia infarction ([place]) 07/29/2021   Transaminitis 07/27/2021   UTI (urinary  tract infection) 07/27/2021   CVA (cerebral vascular accident) ([place]) 07/27/2021   Fall 07/27/2021   Hyperglycemia 07/27/2021   CKD (chronic kidney disease), stage IV ([place]) 07/27/2021   Cholelithiasis 07/27/2021   Hypoxia 07/27/2021   Nausea and vomiting 14/70/9295   Acute metabolic encephalopathy 74/73/4037   Normocytic anemia 07/27/2021   Chronic back pain 07/27/2021   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[place]) 06/22/2021   Closed fracture of first lumbar vertebra with routine healing 02/03/2021   Closed fracture of multiple ribs 11/18/2020   Anxiety 01/29/2020   Leg pain, bilateral 01/29/2020   Ingrown toenail 07/13/2019   Lumbar spondylosis  05/02/2018   Pain in left knee 03/09/2018   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   Trochanteric bursitis of left hip 01/16/2018   Preventative health care 09/26/2017   HTN (hypertension) 07/19/2015   Hyperlipidemia 07/19/2015   Great toe pain 02/11/2014   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. [place], [name], [address] Phone: [phone]   Fax:  [phone]  Name: William [name] MRN: [id number] Date of Birth: [birth year]

## 2022-02-06 ENCOUNTER — Other Ambulatory Visit: Payer: Self-pay | Admitting: Family Medicine

## 2022-02-08 ENCOUNTER — Ambulatory Visit: Payer: PPO | Admitting: Physical Therapy

## 2022-02-08 ENCOUNTER — Encounter: Payer: Self-pay | Admitting: Physical Therapy

## 2022-02-08 ENCOUNTER — Ambulatory Visit: Payer: PPO | Admitting: Occupational Therapy

## 2022-02-08 ENCOUNTER — Encounter: Payer: Self-pay | Admitting: Occupational Therapy

## 2022-02-08 ENCOUNTER — Other Ambulatory Visit (HOSPITAL_BASED_OUTPATIENT_CLINIC_OR_DEPARTMENT_OTHER): Payer: Self-pay

## 2022-02-08 DIAGNOSIS — M25512 Pain in left shoulder: Secondary | ICD-10-CM

## 2022-02-08 DIAGNOSIS — I69318 Other symptoms and signs involving cognitive functions following cerebral infarction: Secondary | ICD-10-CM

## 2022-02-08 DIAGNOSIS — M6281 Muscle weakness (generalized): Secondary | ICD-10-CM

## 2022-02-08 DIAGNOSIS — R2689 Other abnormalities of gait and mobility: Secondary | ICD-10-CM

## 2022-02-08 DIAGNOSIS — R278 Other lack of coordination: Secondary | ICD-10-CM

## 2022-02-08 DIAGNOSIS — Z9181 History of falling: Secondary | ICD-10-CM

## 2022-02-08 DIAGNOSIS — R262 Difficulty in walking, not elsewhere classified: Secondary | ICD-10-CM

## 2022-02-08 DIAGNOSIS — R41840 Attention and concentration deficit: Secondary | ICD-10-CM | POA: Diagnosis not present

## 2022-02-08 DIAGNOSIS — I69354 Hemiplegia and hemiparesis following cerebral infarction affecting left non-dominant side: Secondary | ICD-10-CM

## 2022-02-08 DIAGNOSIS — R296 Repeated falls: Secondary | ICD-10-CM

## 2022-02-08 MED ORDER — TAMSULOSIN HCL 0.4 MG PO CAPS
0.4000 mg | ORAL_CAPSULE | Freq: Every day | ORAL | 1 refills | Status: DC
  Filled 2022-02-08: qty 90, 90d supply, fill #0
  Filled 2022-07-12: qty 90, 90d supply, fill #1

## 2022-02-08 NOTE — Therapy (Unsigned)
OUTPATIENT OCCUPATIONAL THERAPY TREATMENT NOTE   Patient Name: William [name] MRN: [id number] DOB:Jul 06, 1940, 81 y.o., male Today's Date: 02/08/2022  PCP: Roma Schanz, DO REFERRING PROVIDER: Carollee Herter, Alferd Apa, DO   OT End of Session - 02/08/22 1536     Visit Number 29    Number of Visits 31    Date for OT Re-Evaluation 02/12/22    Authorization Type [employer]    Authorization Time Period VL: [place]    Progress Note Due on Visit 30    OT Start Time 1531    OT Stop Time 1615    OT Time Calculation (min) 44 min    Activity Tolerance Patient tolerated treatment well    Behavior During Therapy Flat affect;Restless            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   Mild, related to stroke history   Nocturia    Renal insufficiency 06/25/2013   S/P carotid endarterectomy    BILATERAL ICA--  PATENT PER DUPLEX  05-19-2012   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   Right calf   Urinary frequency    Vitamin D deficiency    Past Surgical History:  Procedure Laterality Date   APPENDECTOMY  AS CHILD   CARDIOVASCULAR STRESS TEST  03-27-2012  DR CRENSHAW   LOW RISK LEXISCAN STUDY-- PROBABLE NORMAL PERFUSION AND SOFT TISSUE ATTENUATION/  NO ISCHEMIA/ EF 51%   CAROTID ENDARTERECTOMY Bilateral LEFT  11-12-2008  DR GREG HAYES   RIGHT ICA  2006   ([facility])   CYSTOSCOPY W/ RETROGRADES Bilateral 06/22/2021   Procedure: CYSTOSCOPY WITH RETROGRADE PYELOGRAM;  Surgeon: Franchot Gallo, MD;  Location: [facility];  Service: Urology;  Laterality: Bilateral;   CYSTOSCOPY WITH LITHOLAPAXY N/A 02/26/2013   Procedure: CYSTOSCOPY WITH LITHOLAPAXY;  Surgeon: Franchot Gallo, MD;  Location: [facility];  Service: Urology;  Laterality: N/A;   EYE SURGERY  Jan. 2016   cataract surgery both eyes   INGUINAL HERNIA REPAIR Right 11-08-2006   IR KYPHO EA ADDL LEVEL THORACIC OR LUMBAR  02/12/2021   IR RADIOLOGIST EVAL & MGMT  02/18/2021   MASS EXCISION N/A 03/03/2016   Procedure: EXCISION OF BACK  MASS;  Surgeon: Stark Klein, MD;  Location: [facility];  Service: General;  Laterality: N/A;   MOHS SURGERY Left 1/ 2016   Dr Nevada Crane-- Basal cell   PROSTATE SURGERY     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-C N/A 06/22/2021   Procedure: TRANSURETHRAL RESECTION OF BLADDER TUMOR;  Surgeon: Franchot Gallo, MD;  Location: [facility];  Service: Urology;  Laterality: N/A;   TRANSURETHRAL RESECTION OF PROSTATE N/A 02/26/2013   Procedure: TRANSURETHRAL RESECTION OF THE PROSTATE WITH GYRUS INSTRUMENTS;  Surgeon: Franchot Gallo, MD;  Location: [place];  Service: Urology;  Laterality: N/A;   TRANSURETHRAL RESECTION OF PROSTATE N/A 06/22/2021   Procedure: TRANSURETHRAL RESECTION OF THE PROSTATE (TURP);  Surgeon: Franchot Gallo, MD;  Location: [facility];  Service: Urology;  Laterality: N/A;   Patient Active Problem List   Diagnosis Date Noted   Left elbow pain 01/26/2022   Mid back pain on left side 01/26/2022   Rib pain 01/26/2022   Acute pain of left shoulder 11/12/2021   Leukocytes in urine 11/12/2021   Urinary frequency 11/12/2021   Thrush 10/08/2021   Hemiplegia, dominant side S/P CVA (cerebrovascular accident) ([place]) 09/11/2021   Insomnia     Prediabetes    Chronic kidney disease (CKD), stage IV (severe) (HCC)    Basal ganglia infarction ([place]) 07/29/2021   Transaminitis 07/27/2021   UTI (urinary tract infection) 07/27/2021   CVA (cerebral vascular accident) ([place]) 07/27/2021   Fall 07/27/2021   Hyperglycemia 07/27/2021   CKD (chronic kidney disease), stage IV ([place]) 07/27/2021   Cholelithiasis 07/27/2021   Hypoxia 07/27/2021   Nausea and vomiting 69/62/9528   Acute metabolic encephalopathy 41/32/4401   Normocytic anemia 07/27/2021   Chronic back pain 07/27/2021   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[place]) 06/22/2021   Closed fracture of first lumbar vertebra with routine healing 02/03/2021   Closed fracture of multiple ribs 11/18/2020   Anxiety 01/29/2020   Leg pain, bilateral 01/29/2020   Ingrown toenail 07/13/2019   Lumbar spondylosis 05/02/2018   Pain in left knee 03/09/2018   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   Trochanteric bursitis of left hip 01/16/2018   Preventative health care 09/26/2017   HTN (hypertension) 07/19/2015   Hyperlipidemia 07/19/2015   Great toe pain 02/11/2014   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   Obesity (BMI 30-39.9) 06/25/2013   Renal insufficiency 06/25/2013   Weakness of left arm 06/25/2013   Sebaceous cyst 03/03/2011   Sprain of lumbar region 07/31/2010   Rib pain, left 08/29/2009   Carotid artery stenosis, asymptomatic, bilateral 05/02/2009   Eczema, atopic 05/31/2008   Vitamin D deficiency 03/01/2008   BPH (benign prostatic hyperplasia) 08/06/2007   Fasting hyperglycemia 12/21/2006   History of right MCA infarct 2006    ONSET DATE: 07/27/21  REFERRING DIAG: R41.4 (ICD-10-CM) - Hemi-inattention I63.9 (ICD-10-CM) - Cerebrovascular accident (CVA), unspecified mechanism ([place])   THERAPY DIAG:  Other lack of coordination  Attention and concentration deficit  History of falling  Other abnormalities of gait and mobility  Other symptoms and  signs involving cognitive functions following cerebral infarction  PERTINENT HISTORY: L basal ganglia and corona radiata infarct 07/27/21 (had documented AMS for about 5 days prior w/ hospital work-up including MRI negative and symptoms attributed to a UTI); L lower homonymous quadrantanopia. Other PMH includes R MCA infarct in 2006 w/ L-sided hemiparesis, NCD, carotid artery disease (bilateral), HTN, HLD,CKD3, and hx of bladder cancer  PRECAUTIONS: Fall risk  SUBJECTIVE:   SUBJECTIVE ASSESSMENT: Pt's wife reports another fall when going on their usual walks; states he had just gotten back to the orthopedic doctor and getting imaging on his L shoulder, but does not think any further injury occurred  PAIN: Are you having pain?  Reports pain in L shoulder, unable to assess number on analog scale and detailed descriptors due to cognitive deficits   OBJECTIVE:   TODAY'S TREATMENT - 02/08/22:  ClockYourself App Tapping the number called out using ClockYourself app w/ RUE; after repetition, OT used tactile and verbal cues  to incorporate LUE when able. Numbers positioned on wall in pattern of traditional analog clock. OT provided consistent cues, verbal and tactile, to decreased perseveration and keep pace w/ the app. Completed in standing for additional challenge. Graded activity up for second set w/ pt tapping each number corresponding to the month called out the app. Pt demonstrated difficulty w/ this, requiring physical assist for errorless completion of task, particularly w/ months on lower L side of clock pattern (I.e., 6-10). Activity facilitated BUE GMC, visual scanning and targeting, attention to task, and processing.  Car Transfer Completed car transfer at end of session into pt's actual car, implementing cueing strategies for repositioning once first in the seat. Able to weight shift better today w/ concrete verbal/contextual/visual cues, but still ended up w/ weight primarily on L hip w/  posterior pelvic tilt despite repetition, physical assist, and various cues; will continue to practice and trial incorporation of AE prn.    PATIENT EDUCATION: Ongoing condition-specific education related to therapeutic interventions completed this session Person educated: Patient and Spouse Education method: Explanation Education comprehension: verbalized understanding   GOALS: Goals reviewed with patient? Yes   SHORT TERM GOALS: Target date: 12/29/2021   STG   Status:  1 Pt/caregiver will verbalize understanding of visual perceptual compensatory strategies Baseline: Decreased knowledge of visual deficits and strategies Met - 12/31/21  2 Pt will complete symbol cancellation task w/ at least 50% accuracy using visual compensatory strategies to indicate improved attention Baseline: 40% accuracy w/ multiple errors Met - 11/17/21  3 Pt will participate in bilateral coordination activity w/ no more than 3 cues for incorporation of LUE Baseline: L-sided inattention Met - 12/22/21: completed task w/ no cues for bil integration      LONG TERM GOALS: Target date: 01/13/2022   LTG   Status:  1 Pt will be able to don overhead shirt w/ SPV and verbal/gestural cues prn in at least 1 attempt Baseline: Min A to don/doff shirt sleeve on L side Progressing  2 Pt will be able to thread bottoms on BLEs or don socks w/ Mod A Baseline: Max A for LB dressing Met - 01/07/22   Pt will demonstrate donning bottoms, incorporating use of AE prn, w/ SPV for safety in at least 2 attempts Baseline: Min A to thread BLE on 01/07/22 Met - 01/21/22  3 Pt will demonstrate supine-to-sit transition w/ cues from caregiver and incorporation of AE/DME prn Baseline: Mod-Max A w/ bed mobility Met - 12/03/21; SPV w/ bed ladder and cues  4 Pt will be able to use TV remote (or adaptive remote) to complete 1 task (e.g., turn TV on/of, turn volume up/down) w/ SPV Baseline: Difficulty  Not Progressing - d/c due to continued difficulty and  agitation w/ task at home  5 Pt will increase Barthel Index score by at least 5 to indicate improved level of participation in BADLs Baseline: 55/100 Progressing  6 Pt will be able to make a peanut butter and jelly sandwich w/ SPV and verbal/tactile cues prn Progressing     ASSESSMENT:   CLINICAL IMPRESSION: Pt arrives to session unfortunately reporting another fall, which is his 4th in the past 3 weeks. Due to this, OT focused session on attention to task, body and spatial awareness, and functional mobility and balance is being closely followed by PT. In recent sessions, pt has demonstrated incr difficulty w/ preservation, general confusion, and attention to task, but did fairly well w/ ClockYourself app activities today; less success w/ increased  cognitive challenge, which is to be expected. Will benefit from refocusing on L-sided awareness and large-amplitude movements as pt is currently demonstrating more shuffling gait pattern, softer voice, and difficulty w/ functional reach. OT continued to incorporate blocked practice, assistance prn for errorless learning, auditory/verbal/tactile cues, and immediate feedback and knowledge of results to facilitate success based on current EBP.    PERFORMANCE DEFICITS in functional skills including ADLs, IADLs, coordination, dexterity, Dyer, GMC, mobility, balance, and vision, cognitive skills including attention, memory, orientation, problem solving, safety awareness, sequencing, and temperament/personality, and psychosocial skills including environmental adaptation, interpersonal interactions, and routines and behaviors.    IMPAIRMENTS are limiting patient from ADLs, IADLs, rest and sleep, leisure, and social participation.    COMORBIDITIES may have co-morbidities that affects occupational performance. Patient will benefit from skilled OT to address above impairments and improve overall function.     PLAN: OT FREQUENCY: 2x/week   OT DURATION: 10 weeks    PLANNED INTERVENTIONS: self care/ADL training, therapeutic exercise, therapeutic activity, neuromuscular re-education, balance training, functional mobility training, aquatic therapy, splinting, moist heat, cryotherapy, patient/family education, cognitive remediation/compensation, visual/perceptual remediation/compensation, psychosocial skills training, energy conservation, and DME and/or AE instructions   RECOMMENDED OTHER SERVICES: Currently receiving PT services at this location   CONSULTED AND AGREED WITH PLAN OF CARE: Patient and family member/caregiver   PLAN FOR NEXT SESSION: Preparatory exercise(s) for L-sided attention, proprioception and kinesthetic sense prior to practice of ADL tasks; continue to address bilateral coordination and attention to task   Kathrine Cords, MSOT, OTR/L 02/08/22, 4:17 PM

## 2022-02-08 NOTE — Therapy (Signed)
[place]. [place], [name], [address] Phone: [phone]   Fax:  [phone]  Physical Therapy Treatment  Patient Details  Name: William [name] MRN: [id number] Date of Birth: Feb 23, 1941 Referring Provider (PT): Supple   Encounter Date: 02/08/2022   PT End of Session - 02/08/22 1444     Visit Number 37    Date for PT Re-Evaluation 03/04/22    Authorization Type HTA    PT Start Time 6962    PT Stop Time 1528    PT Time Calculation (min) 45 min    Equipment Utilized During Treatment Gait belt    Activity Tolerance Patient tolerated treatment well    Behavior During Therapy Flat affect;Restless             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   Mild, related to stroke history   Nocturia    Renal insufficiency 06/25/2013   S/P carotid endarterectomy    BILATERAL ICA--  PATENT PER DUPLEX  05-19-2012   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   Right calf   Urinary frequency    Vitamin D deficiency     Past Surgical History:  Procedure Laterality Date   APPENDECTOMY  AS CHILD   CARDIOVASCULAR STRESS TEST  03-27-2012  DR CRENSHAW   LOW RISK LEXISCAN STUDY-- PROBABLE NORMAL PERFUSION AND SOFT TISSUE ATTENUATION/  NO ISCHEMIA/ EF 51%   CAROTID ENDARTERECTOMY Bilateral LEFT  11-12-2008  DR  GREG HAYES   RIGHT ICA  2006  ([facility])   CYSTOSCOPY W/ RETROGRADES Bilateral 06/22/2021   Procedure: CYSTOSCOPY WITH RETROGRADE PYELOGRAM;  Surgeon: Franchot Gallo, MD;  Location: [facility];  Service: Urology;  Laterality: Bilateral;   CYSTOSCOPY WITH LITHOLAPAXY N/A 02/26/2013   Procedure: CYSTOSCOPY WITH LITHOLAPAXY;  Surgeon: Franchot Gallo, MD;  Location: [facility];  Service: Urology;  Laterality: N/A;   EYE SURGERY  Jan. 2016   cataract surgery both eyes   INGUINAL HERNIA REPAIR Right 11-08-2006   IR KYPHO EA ADDL LEVEL THORACIC OR LUMBAR  02/12/2021   IR RADIOLOGIST EVAL & MGMT  02/18/2021   MASS EXCISION N/A 03/03/2016   Procedure: EXCISION OF BACK  MASS;  Surgeon: Stark Klein, MD;  Location: [facility];  Service: General;  Laterality: N/A;   MOHS SURGERY Left 1/ 2016   Dr Nevada Crane-- Basal cell   PROSTATE SURGERY     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-C N/A 06/22/2021   Procedure: TRANSURETHRAL RESECTION OF BLADDER TUMOR;  Surgeon: Franchot Gallo, MD;  Location: [facility];  Service: Urology;  Laterality: N/A;   TRANSURETHRAL RESECTION OF PROSTATE N/A 02/26/2013   Procedure: TRANSURETHRAL RESECTION OF THE PROSTATE WITH GYRUS INSTRUMENTS;  Surgeon: Franchot Gallo,  MD;  Location: [facility];  Service: Urology;  Laterality: N/A;   TRANSURETHRAL RESECTION OF PROSTATE N/A 06/22/2021   Procedure: TRANSURETHRAL RESECTION OF THE PROSTATE (TURP);  Surgeon: Franchot Gallo, MD;  Location: [facility];  Service: Urology;  Laterality: N/A;    There were no vitals filed for this visit.   Subjective Assessment - 02/08/22 1456     Subjective Patient had a fall last week, has order for PT to treat the shoulder, PT last week took measurements and he has good ROM as the concern is for frozen shoulder    Currently in Pain? Yes    Pain Score 3     Pain Location Shoulder    Pain  Orientation Left    Pain Descriptors / Indicators Sore    Aggravating Factors  the fall I am sore                [facility] PT Assessment - 02/08/22 0001       PROM   Left Shoulder Flexion 90 Degrees    Left Shoulder ABduction 90 Degrees    Left Shoulder Internal Rotation 52 Degrees    Left Shoulder External Rotation 66 Degrees      Timed Up and Go Test   Normal TUG (seconds) 11    TUG Comments no device                           [facility] Adult PT Treatment/Exercise - 02/08/22 0001       Ambulation/Gait   Gait Comments walking cues fo rbig steps 200', stairs step over step      High Level Balance   High Level Balance Comments volleyball seated and standing, ball kicks, direction changes      Lumbar Exercises: Aerobic   Nustep level 5 x 6 minutes, did not use left arm      Lumbar Exercises: Standing   Other Standing Lumbar Exercises narches, hip abduction 2# 2x10 each      Lumbar Exercises: Seated   Other Seated Lumbar Exercises UE ranger all directions left shoulder                       PT Short Term Goals - 12/14/21 1451       PT SHORT TERM GOAL #1   Title independent with initial HEP    Time 2    Period Weeks    Status On-going               PT Long Term Goals - 02/04/22 1631       PT LONG TERM GOAL #1   Title Understand fall risks, the importance of exercise and flexibility to health and function    Status Achieved      PT LONG TERM GOAL #2   Title decrease TUG time to 13 seconds    Time 12    Period Weeks    Status On-going      PT LONG TERM GOAL #3   Title walk around our building without rest     Time 10    Period Weeks    Status On-going      PT LONG TERM GOAL #4   Title increase LE strength to 4+/5    Time 12    Period Weeks    Status On-going      PT LONG TERM GOAL #5   Title increase Merrilee Jansky  blance test score to 46/56    Time 12    Period Weeks    Status On-going      Additional Long Term Goals    Additional Long Term Goals Yes      PT LONG TERM GOAL #6   Title decrease left shoulder pain 50%    Time 12    Period Weeks    Status On-going      PT LONG TERM GOAL #7   Title able to get up from supine and from sitting without help    Time 12    Status On-going      PT LONG TERM GOAL #8   Title Increase L shoulder flexion and abd to at least 120    Baseline AROM 74 flex, 58 abd    Time 4    Period Weeks    Status New    Target Date 03/04/22      PT LONG TERM GOAL  #9   TITLE Increase L shoulder strength to at least 3+/5 throughout    Baseline 3-/5    Time 4    Period Weeks    Status New    Target Date 03/04/22                   Plan - 02/08/22 1700     Clinical Impression Statement Added a little left shoulder ROM with the UE ranger, he did not c/o pain with any activity today, reports taht he does have some pain in the right hip with walking backwards, his wife does report a fall in the home where he hit his foot on the table, I continued to work with him on balance and gait, his wife has stopped walking with him outside due to the falls.    PT Next Visit Plan Progressive strength/ROM L shoulder, balance gait    Consulted and Agree with Plan of Care Patient;Family member/caregiver    Family Member Consulted wife [name]             Patient will benefit from skilled therapeutic intervention in order to improve the following deficits and impairments:  Abnormal gait, Decreased coordination, Decreased range of motion, Difficulty walking, Increased muscle spasms, Cardiopulmonary status limiting activity, Decreased endurance, Decreased activity tolerance, Pain, Impaired flexibility, Decreased balance, Decreased mobility, Decreased strength, Postural dysfunction, Increased edema  Visit Diagnosis: History of falling  Muscle weakness (generalized)  Repeated falls  Other abnormalities of gait and mobility  Difficulty in walking, not elsewhere  classified  Hemiplegia and hemiparesis following cerebral infarction affecting left non-dominant side (HCC)  Other lack of coordination  Acute pain of left shoulder     Problem List Patient Active Problem List   Diagnosis Date Noted   Left elbow pain 01/26/2022   Mid back pain on left side 01/26/2022   Rib pain 01/26/2022   Acute pain of left shoulder 11/12/2021   Leukocytes in urine 11/12/2021   Urinary frequency 11/12/2021   Thrush 10/08/2021   Hemiplegia, dominant side S/P CVA (cerebrovascular accident) ([place]) 09/11/2021   Insomnia    Prediabetes    Chronic kidney disease (CKD), stage IV (severe) (HCC)    Basal ganglia infarction ([place]) 07/29/2021   Transaminitis 07/27/2021   UTI (urinary tract infection) 07/27/2021   CVA (cerebral vascular accident) ([place]) 07/27/2021   Fall 07/27/2021   Hyperglycemia 07/27/2021   CKD (chronic kidney disease), stage IV ([place]) 07/27/2021   Cholelithiasis 07/27/2021   Hypoxia 07/27/2021   Nausea and vomiting  42/39/5320   Acute metabolic encephalopathy 23/34/3568   Normocytic anemia 07/27/2021   Chronic back pain 07/27/2021   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[place]) 06/22/2021   Closed fracture of first lumbar vertebra with routine healing 02/03/2021   Closed fracture of multiple ribs 11/18/2020   Anxiety 01/29/2020   Leg pain, bilateral 01/29/2020   Ingrown toenail 07/13/2019   Lumbar spondylosis 05/02/2018   Pain in left knee 03/09/2018   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   Trochanteric bursitis of left hip 01/16/2018   Preventative health care 09/26/2017   HTN (hypertension) 07/19/2015   Hyperlipidemia 07/19/2015   Great toe pain 02/11/2014   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 [place], [name], [address] Phone: [phone]   Fax:  [phone]  Name: William [name] MRN: [id number] Date of Birth: 02/17/1941

## 2022-02-09 ENCOUNTER — Other Ambulatory Visit (HOSPITAL_BASED_OUTPATIENT_CLINIC_OR_DEPARTMENT_OTHER): Payer: Self-pay

## 2022-02-10 ENCOUNTER — Encounter: Payer: Self-pay | Admitting: Physical Therapy

## 2022-02-10 ENCOUNTER — Ambulatory Visit: Payer: PPO | Admitting: Physical Therapy

## 2022-02-10 DIAGNOSIS — R41840 Attention and concentration deficit: Secondary | ICD-10-CM | POA: Diagnosis not present

## 2022-02-10 DIAGNOSIS — R262 Difficulty in walking, not elsewhere classified: Secondary | ICD-10-CM

## 2022-02-10 DIAGNOSIS — R296 Repeated falls: Secondary | ICD-10-CM

## 2022-02-10 DIAGNOSIS — Z9181 History of falling: Secondary | ICD-10-CM

## 2022-02-10 DIAGNOSIS — I69354 Hemiplegia and hemiparesis following cerebral infarction affecting left non-dominant side: Secondary | ICD-10-CM

## 2022-02-10 DIAGNOSIS — R278 Other lack of coordination: Secondary | ICD-10-CM

## 2022-02-10 DIAGNOSIS — M25512 Pain in left shoulder: Secondary | ICD-10-CM

## 2022-02-10 DIAGNOSIS — M6281 Muscle weakness (generalized): Secondary | ICD-10-CM

## 2022-02-10 DIAGNOSIS — R2689 Other abnormalities of gait and mobility: Secondary | ICD-10-CM

## 2022-02-10 NOTE — Therapy (Signed)
[facility] [place]. [place], [name], [address] Phone: [phone]   Fax:  [phone]  Physical Therapy Treatment  Patient Details  Name: William [name] MRN: [id number] Date of Birth: 01-11-1941 Referring Provider (PT): Supple   Encounter Date: 02/10/2022   PT End of Session - 02/10/22 1456     Visit Number 38    Date for PT Re-Evaluation 03/04/22    Authorization Type HTA    PT Start Time 1440    PT Stop Time 1530    PT Time Calculation (min) 50 min    Equipment Utilized During Treatment Gait belt    Activity Tolerance Patient tolerated treatment well    Behavior During Therapy Flat affect;Restless             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   Mild, related to stroke history   Nocturia    Renal insufficiency 06/25/2013   S/P carotid endarterectomy    BILATERAL ICA--  PATENT PER DUPLEX  05-19-2012   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   Right calf   Urinary frequency    Vitamin D deficiency     Past Surgical History:  Procedure Laterality Date   APPENDECTOMY  AS CHILD   CARDIOVASCULAR STRESS TEST  03-27-2012  DR CRENSHAW   LOW RISK LEXISCAN STUDY-- PROBABLE NORMAL PERFUSION AND SOFT TISSUE ATTENUATION/  NO ISCHEMIA/ EF 51%   CAROTID ENDARTERECTOMY Bilateral LEFT  11-12-2008  DR  GREG HAYES   RIGHT ICA  2006  ([facility])   CYSTOSCOPY W/ RETROGRADES Bilateral 06/22/2021   Procedure: CYSTOSCOPY WITH RETROGRADE PYELOGRAM;  Surgeon: Franchot Gallo, MD;  Location: [facility];  Service: Urology;  Laterality: Bilateral;   CYSTOSCOPY WITH LITHOLAPAXY N/A 02/26/2013   Procedure: CYSTOSCOPY WITH LITHOLAPAXY;  Surgeon: Franchot Gallo, MD;  Location: [facility];  Service: Urology;  Laterality: N/A;   EYE SURGERY  Jan. 2016   cataract surgery both eyes   INGUINAL HERNIA REPAIR Right 11-08-2006   IR KYPHO EA ADDL LEVEL THORACIC OR LUMBAR  02/12/2021   IR RADIOLOGIST EVAL & MGMT  02/18/2021   MASS EXCISION N/A 03/03/2016   Procedure: EXCISION OF BACK  MASS;  Surgeon: Stark Klein, MD;  Location: [facility];  Service: General;  Laterality: N/A;   MOHS SURGERY Left 1/ 2016   Dr Nevada Crane-- Basal cell   PROSTATE SURGERY     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-C N/A 06/22/2021   Procedure: TRANSURETHRAL RESECTION OF BLADDER TUMOR;  Surgeon: Franchot Gallo, MD;  Location: [facility];  Service: Urology;  Laterality: N/A;   TRANSURETHRAL RESECTION OF PROSTATE N/A 02/26/2013   Procedure: TRANSURETHRAL RESECTION OF THE PROSTATE WITH GYRUS INSTRUMENTS;  Surgeon: Franchot Gallo,  MD;  Location: [facility];  Service: Urology;  Laterality: N/A;   TRANSURETHRAL RESECTION OF PROSTATE N/A 06/22/2021   Procedure: TRANSURETHRAL RESECTION OF THE PROSTATE (TURP);  Surgeon: Franchot Gallo, MD;  Location[facility];  Service: Urology;  Laterality: N/A;    There were no vitals filed for this visit.   Subjective Assessment - 02/10/22 1456     Subjective No recent falls, reports has some c/o right hip pain    Currently in Pain? Yes    Pain Score 3     Pain Location Hip    Pain Orientation Right    Pain Descriptors / Indicators Sore                                [facility] Adult PT Treatment/Exercise - 02/10/22 0001       Transfers   Comments on mat table scooting from side to side      Ambulation/Gait   Gait Comments walk outside in the grass, with gait belt and CGA, some cues to slow down and take big steps, then we walked out the back door and to the front a few times needing cues and min A to stop , straighten up, slow down and take big steps      High Level Balance   High Level Balance Comments volleyball standing, ball kicks      Lumbar Exercises: Machines for Strengthening   Cybex Knee Extension 5# left only 3x10    Cybex Knee Flexion 10# left only 3x10      Lumbar Exercises: Standing   Other Standing Lumbar Exercises marches, hip abduction 2# 2x10 each      Lumbar Exercises: Supine   Other Supine Lumbar Exercises bridges 3x10, partial sit ups                       PT Short Term Goals - 12/14/21 1451       PT SHORT TERM GOAL #1   Title independent with initial HEP    Time 2    Period Weeks    Status On-going               PT Long Term Goals - 02/10/22 1458       PT LONG TERM GOAL #2   Title decrease TUG time to 13 seconds    Status Partially Met                   Plan - 02/10/22 1539     Clinical Impression Statement I walked more with him today as it is too much risk right now for his wife to walk with him, He did okay, still tends to get stooped forward and then takes the shuffling steps and gets out of control, needs an occasional cue to stop and regroup, really can take about 5-6 good steps before reverting back to the shuffles    PT Next Visit Plan Progressive strength/ROM L shoulder, balance gait    Consulted and Agree with Plan of Care Patient;Family member/caregiver    Family Member Consulted wife [name]             Patient will benefit from skilled therapeutic intervention in order to improve the following deficits and impairments:  Abnormal gait, Decreased coordination, Decreased  range of motion, Difficulty walking, Increased muscle spasms, Cardiopulmonary status limiting activity, Decreased endurance, Decreased activity tolerance,  Pain, Impaired flexibility, Decreased balance, Decreased mobility, Decreased strength, Postural dysfunction, Increased edema  Visit Diagnosis: History of falling  Muscle weakness (generalized)  Repeated falls  Other abnormalities of gait and mobility  Difficulty in walking, not elsewhere classified  Hemiplegia and hemiparesis following cerebral infarction affecting left non-dominant side (HCC)  Other lack of coordination  Acute pain of left shoulder     Problem List Patient Active Problem List   Diagnosis Date Noted   Left elbow pain 01/26/2022   Mid back pain on left side 01/26/2022   Rib pain 01/26/2022   Acute pain of left shoulder 11/12/2021   Leukocytes in urine 11/12/2021   Urinary frequency 11/12/2021   Thrush 10/08/2021   Hemiplegia, dominant side S/P CVA (cerebrovascular accident) ([place]) 09/11/2021   Insomnia    Prediabetes    Chronic kidney disease (CKD), stage IV (severe) (HCC)    Basal ganglia infarction ([place]) 07/29/2021   Transaminitis 07/27/2021   UTI (urinary tract infection) 07/27/2021   CVA (cerebral vascular accident) ([place]) 07/27/2021   Fall 07/27/2021   Hyperglycemia 07/27/2021   CKD (chronic kidney disease), stage IV ([place]) 07/27/2021   Cholelithiasis 07/27/2021   Hypoxia 07/27/2021   Nausea and vomiting 54/65/0354   Acute metabolic encephalopathy 65/68/1275   Normocytic anemia 07/27/2021   Chronic back pain 07/27/2021   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[place]) 06/22/2021   Closed fracture of first lumbar vertebra with routine healing 02/03/2021   Closed fracture of multiple ribs 11/18/2020   Anxiety 01/29/2020   Leg pain, bilateral 01/29/2020   Ingrown toenail 07/13/2019   Lumbar spondylosis 05/02/2018   Pain in left knee 03/09/2018   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    Trochanteric bursitis of left hip 01/16/2018   Preventative health care 09/26/2017   HTN (hypertension) 07/19/2015   Hyperlipidemia 07/19/2015   Great toe pain 02/11/2014   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 [place], [name], [address] Phone: [phone]   Fax:  [phone]  Name: SILVINO SELMAN MRN: [id number] Date of Birth: 1940/06/20

## 2022-02-11 ENCOUNTER — Encounter: Payer: Self-pay | Admitting: Physical Therapy

## 2022-02-11 ENCOUNTER — Encounter: Payer: Self-pay | Admitting: Occupational Therapy

## 2022-02-11 ENCOUNTER — Ambulatory Visit: Payer: PPO | Admitting: Physical Therapy

## 2022-02-11 ENCOUNTER — Ambulatory Visit: Payer: PPO | Admitting: Occupational Therapy

## 2022-02-11 DIAGNOSIS — I69354 Hemiplegia and hemiparesis following cerebral infarction affecting left non-dominant side: Secondary | ICD-10-CM

## 2022-02-11 DIAGNOSIS — R41840 Attention and concentration deficit: Secondary | ICD-10-CM | POA: Diagnosis not present

## 2022-02-11 DIAGNOSIS — R262 Difficulty in walking, not elsewhere classified: Secondary | ICD-10-CM

## 2022-02-11 DIAGNOSIS — I10 Essential (primary) hypertension: Secondary | ICD-10-CM | POA: Diagnosis not present

## 2022-02-11 DIAGNOSIS — I679 Cerebrovascular disease, unspecified: Secondary | ICD-10-CM | POA: Diagnosis not present

## 2022-02-11 DIAGNOSIS — Z9181 History of falling: Secondary | ICD-10-CM

## 2022-02-11 DIAGNOSIS — R278 Other lack of coordination: Secondary | ICD-10-CM

## 2022-02-11 DIAGNOSIS — R2689 Other abnormalities of gait and mobility: Secondary | ICD-10-CM

## 2022-02-11 DIAGNOSIS — R296 Repeated falls: Secondary | ICD-10-CM

## 2022-02-11 DIAGNOSIS — I69318 Other symptoms and signs involving cognitive functions following cerebral infarction: Secondary | ICD-10-CM

## 2022-02-11 DIAGNOSIS — M6281 Muscle weakness (generalized): Secondary | ICD-10-CM

## 2022-02-11 NOTE — Therapy (Signed)
OUTPATIENT OCCUPATIONAL THERAPY TREATMENT NOTE & PROGRESS NOTE   Patient Name: William [name] MRN: [id number] DOB:[birth year], 81 y.o., male Today's Date: 02/11/2022  PCP: Roma Schanz, DO REFERRING PROVIDER: Carollee Herter, Alferd Apa, DO   OT End of Session - 02/11/22 1512     Visit Number 30    Number of Visits 35    Date for OT Re-Evaluation 03/18/22    Authorization Type [employer]    Authorization Time Period VL: MN    Progress Note Due on Visit 30    OT Start Time 1446    OT Stop Time 1527    OT Time Calculation (min) 41 min    Activity Tolerance Patient tolerated treatment well    Behavior During Therapy Flat affect;Restless            Occupational Therapy Progress Note   Dates of Reporting Period: 01/05/22 to 02/11/22   Objective Reports of Subjective Statement: Pt not currently involved in respite care/day center program   Objective Measurements: Barthel Index, symbol cancellation, levels of assistance during functional activities   Goal Update: Pt has met 3/3 STGs and 3/6 LTGs; progressing well toward remaining LTGs   Plan: Continue to address coordination, balance, GM/FM control, cognition, safety awareness, compensatory strategies/AE prn, inattention and visual-perception   Reason Skilled Services are Required: Deficits in executive functioning, functional use of LUE, imbalance, as well as weakness and decreased coordination continue to be significantly limiting to participation and safety in BADLs, IADLs, and functional mobility, which also contributes to caregiver strain  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   Mohs surgery jan 2016    Bladder stone    BPH (benign prostatic hyperplasia) 08/06/2007   Carotid artery occlusion    Chronic kidney disease 2014   Stage III   Closed fracture of fifth metacarpal bone 05/15/2015   Eczema    Fasting hyperglycemia 12/21/2006   GERD  (gastroesophageal reflux disease)    History of carotid artery stenosis    S/P BILATERAL CEA   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   HTN (hypertension) 07/19/2015   Hyperlipidemia    Hypertension    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   Mild, related to stroke history   Nocturia    Renal insufficiency 06/25/2013   S/P carotid endarterectomy    BILATERAL ICA--  PATENT PER DUPLEX  05-19-2012   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   Right calf   Urinary frequency    Vitamin D deficiency    Past Surgical History:  Procedure Laterality Date   APPENDECTOMY  AS CHILD   CARDIOVASCULAR STRESS TEST  03-27-2012  DR CRENSHAW   LOW RISK LEXISCAN STUDY-- PROBABLE NORMAL PERFUSION AND SOFT TISSUE ATTENUATION/  NO ISCHEMIA/ EF 51%   CAROTID ENDARTERECTOMY Bilateral LEFT  11-12-2008  DR GREG HAYES   RIGHT ICA  2006  ([facility])   CYSTOSCOPY W/ RETROGRADES Bilateral 06/22/2021   Procedure: CYSTOSCOPY WITH RETROGRADE PYELOGRAM;  Surgeon: Franchot Gallo, MD;  Location: [facility];  Service: Urology;  Laterality: Bilateral;   CYSTOSCOPY WITH LITHOLAPAXY N/A 02/26/2013   Procedure: CYSTOSCOPY WITH LITHOLAPAXY;  Surgeon: Franchot Gallo, MD;  Location: [facility];  Service: Urology;  Laterality: N/A;   EYE SURGERY  Jan. 2016   cataract surgery both eyes   INGUINAL HERNIA REPAIR Right 11-08-2006   IR KYPHO  EA ADDL LEVEL THORACIC OR LUMBAR  02/12/2021   IR RADIOLOGIST EVAL & MGMT  02/18/2021   MASS EXCISION N/A 03/03/2016   Procedure: EXCISION OF BACK  MASS;  Surgeon: Stark Klein, MD;  Location: [facility];  Service: General;  Laterality: N/A;   MOHS SURGERY Left 1/ 2016   Dr Nevada Crane-- Basal cell   PROSTATE SURGERY     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-C N/A 06/22/2021   Procedure: TRANSURETHRAL RESECTION OF BLADDER TUMOR;  Surgeon: Franchot Gallo, MD;  Location: [facility];  Service: Urology;  Laterality: N/A;   TRANSURETHRAL RESECTION OF PROSTATE N/A 02/26/2013   Procedure: TRANSURETHRAL RESECTION OF THE PROSTATE WITH GYRUS INSTRUMENTS;  Surgeon: Franchot Gallo, MD;  Location: [facility];  Service: Urology;  Laterality: N/A;   TRANSURETHRAL RESECTION OF PROSTATE N/A 06/22/2021   Procedure: TRANSURETHRAL RESECTION OF THE PROSTATE (TURP);  Surgeon: Franchot Gallo, MD;  Location: [facility];  Service: Urology;  Laterality: N/A;   Patient Active Problem List   Diagnosis Date Noted   Left elbow pain 01/26/2022   Mid back pain on left side 01/26/2022   Rib pain 01/26/2022   Acute pain of left shoulder 11/12/2021   Leukocytes in urine 11/12/2021   Urinary frequency 11/12/2021   Thrush 10/08/2021   Hemiplegia, dominant side S/P CVA (cerebrovascular accident) ([place]) 09/11/2021   Insomnia    Prediabetes    Chronic kidney disease (CKD), stage IV (severe) (HCC)    Basal ganglia infarction ([place]) 07/29/2021   Transaminitis 07/27/2021   UTI (urinary tract infection) 07/27/2021   CVA (cerebral vascular accident) ([place]) 07/27/2021   Fall 07/27/2021   Hyperglycemia 07/27/2021   CKD (chronic kidney disease), stage IV ([place]) 07/27/2021   Cholelithiasis 07/27/2021   Hypoxia 07/27/2021   Nausea and vomiting 97/35/3299   Acute metabolic encephalopathy 24/26/8341   Normocytic anemia 07/27/2021   Chronic back pain 07/27/2021   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[place]) 06/22/2021   Closed fracture of first lumbar vertebra with routine healing 02/03/2021   Closed fracture of multiple ribs 11/18/2020   Anxiety 01/29/2020   Leg pain, bilateral 01/29/2020   Ingrown toenail 07/13/2019   Lumbar spondylosis 05/02/2018   Pain in left knee 03/09/2018   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   Trochanteric bursitis of left hip 01/16/2018   Preventative health care 09/26/2017   HTN (hypertension) 07/19/2015   Hyperlipidemia  07/19/2015   Great toe pain 02/11/2014   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   Obesity (BMI 30-39.9) 06/25/2013   Renal insufficiency 06/25/2013   Weakness of left arm 06/25/2013   Sebaceous cyst 03/03/2011   Sprain of lumbar region 07/31/2010   Rib pain, left 08/29/2009   Carotid artery stenosis, asymptomatic, bilateral 05/02/2009   Eczema, atopic 05/31/2008   Vitamin D deficiency 03/01/2008   BPH (benign prostatic hyperplasia) 08/06/2007   Fasting hyperglycemia 12/21/2006   History of right MCA infarct 2006    ONSET DATE: 07/27/21  REFERRING DIAG: R41.4 (ICD-10-CM) - Hemi-inattention I63.9 (ICD-10-CM) - Cerebrovascular accident (CVA), unspecified mechanism ([place])   THERAPY DIAG:  Other abnormalities of gait and mobility  Other lack of coordination  Attention and concentration deficit  Other symptoms and signs involving cognitive functions following cerebral infarction  History of falling  PERTINENT HISTORY: L basal ganglia and corona radiata infarct 07/27/21 (had documented AMS for about 5 days prior w/ hospital work-up including MRI negative and symptoms attributed to a UTI); L  lower homonymous quadrantanopia. Other PMH includes R MCA infarct in 2006 w/ L-sided hemiparesis, NCD, carotid artery disease (bilateral), HTN, HLD,CKD3, and hx of bladder cancer  PRECAUTIONS: Fall risk  SUBJECTIVE:   SUBJECTIVE ASSESSMENT: Pt's wife reports she has considered exploring day centers/respite programs, but does not know how to get involved in that  PAIN: Are you having pain? No   OBJECTIVE:   TODAY'S TREATMENT - 02/11/22:  Ball Sitting on large exercise ball in // bars in front of tall mirror, working on weight shifting side to side and back to simulate functional mobility and transfers. OT provided narration for pacing and verbal/visual/tactile cues for consistency w/ large amplitude movement. Completed w/ and w/out UE support w/ success improving w/ repetition   ADLs Practiced pouring cereal into bowl x2 from box, focusing on amount of cereal poured and sequencing. Pt required some assist w/ problem-solving angle of cereal box and for appropriate amount to pour into bowl. OT discussed pt participation in this task at home, as he had previously been participating at [facility] level and encouraged him to practice first 3 steps of task between now and next session.  Attention Activity Sorting jumbo playing cards used to address attention to task, cognitive flexibility for sequencing, and visual scanning/visual perception. Completed x3 w/ OT grading task up/down prn to facilitate success. Pt demonstrated perseveration of sorting by suits vs number, but success improved after repetition w/ pt demonstrating some recognition of errors before he made them.    PATIENT EDUCATION: Ongoing condition-specific education related to therapeutic interventions completed this session Person educated: Patient and Spouse Education method: Explanation Education comprehension: verbalized understanding   GOALS: Goals reviewed with patient? Yes   SHORT TERM GOALS: Target date: 12/29/2021   STG   Status:  1 Pt/caregiver will verbalize understanding of visual perceptual compensatory strategies Baseline: Decreased knowledge of visual deficits and strategies Met - 12/31/21  2 Pt will complete symbol cancellation task w/ at least 50% accuracy using visual compensatory strategies to indicate improved attention Baseline: 40% accuracy w/ multiple errors Met - 11/17/21  3 Pt will participate in bilateral coordination activity w/ no more than 3 cues for incorporation of LUE Baseline: L-sided inattention Met - 12/22/21: completed task w/ no cues for bil integration      LONG TERM GOALS: Target date: 01/13/2022   LTG   Status:  1 Pt will be able to don overhead shirt w/ SPV and verbal/gestural cues prn in at least 1 attempt Baseline: Min A to don/doff shirt sleeve on L side Progressing  2 Pt  will be able to thread bottoms on BLEs or don socks w/ Mod A Baseline: Max A for LB dressing Met - 01/07/22   Pt will demonstrate donning bottoms, incorporating use of AE prn, w/ SPV for safety in at least 2 attempts Baseline: Min A to thread BLE on 01/07/22 Met - 01/21/22  3 Pt will demonstrate supine-to-sit transition w/ cues from caregiver and incorporation of AE/DME prn Baseline: Mod-Max A w/ bed mobility Met - 12/03/21; SPV w/ bed ladder and cues  4 Pt will be able to use TV remote (or adaptive remote) to complete 1 task (e.g., turn TV on/of, turn volume up/down) w/ SPV Baseline: Difficulty  Not Progressing - d/c due to continued difficulty and agitation w/ task at home  5 Pt will increase Barthel Index score by at least 5 to indicate improved level of participation in BADLs Baseline: 55/100 Progressing  6 Pt will be able to  make a peanut butter and jelly sandwich w/ SPV and verbal/tactile cues prn Progressing     ASSESSMENT:   CLINICAL IMPRESSION: OT returned to trialing weight shifting on large therapy ball, focusing on L and R as well as posterior weight shifting to simulate car transfers. Pt was able to tolerate activity w/out UE support for longer than pt has in previous sessions. OT also continued to address attention to task, sequencing, L-sided tabletop awareness, and participation in ADLs. Will benefit from continued focus on L-sided awareness and large-amplitude movements as pt is currently demonstrating shuffling gait pattern, soft voice, and difficulty w/ functional reach. OT continued to incorporate blocked practice, assistance prn for errorless learning, auditory/verbal/tactile cues, and immediate feedback and knowledge of results to facilitate success based on current EBP. Skilled OT services will continue to be beneficial to maintain functional participation in daily tasks and help reduce spouse/caregiver strain.   PERFORMANCE DEFICITS in functional skills including ADLs, IADLs,  coordination, dexterity, [place], GMC, mobility, balance, and vision, cognitive skills including attention, memory, orientation, problem solving, safety awareness, sequencing, and temperament/personality, and psychosocial skills including environmental adaptation, interpersonal interactions, and routines and behaviors.    IMPAIRMENTS are limiting patient from ADLs, IADLs, rest and sleep, leisure, and social participation.    COMORBIDITIES may have co-morbidities that affects occupational performance. Patient will benefit from skilled OT to address above impairments and improve overall function.     PLAN: OT FREQUENCY: 2x/week   OT DURATION: 10 weeks   PLANNED INTERVENTIONS: self care/ADL training, therapeutic exercise, therapeutic activity, neuromuscular re-education, balance training, functional mobility training, aquatic therapy, splinting, moist heat, cryotherapy, patient/family education, cognitive remediation/compensation, visual/perceptual remediation/compensation, psychosocial skills training, energy conservation, and DME and/or AE instructions   RECOMMENDED OTHER SERVICES: Currently receiving PT services at this location   CONSULTED AND AGREED WITH PLAN OF CARE: Patient and family member/caregiver   PLAN FOR NEXT SESSION: Preparatory exercise(s) for L-sided attention, proprioception and kinesthetic sense prior to practice of ADL tasks; continue to address bilateral coordination and attention to task   Kathrine Cords, MSOT, OTR/L 02/11/22, 3:32 PM

## 2022-02-11 NOTE — Therapy (Signed)
[place] [place], [name], [address] Phone: [phone]   Fax:  [phone]  Physical Therapy Treatment  Patient Details  Name: William [name] MRN: [id number] Date of Birth: [birth year] Referring Provider (PT): Supple   Encounter Date: 02/11/2022   PT End of Session - 02/11/22 1526     Visit Number 39    Date for PT Re-Evaluation 03/04/22    Authorization Type HTA    PT Start Time 1532    PT Stop Time 1610    PT Time Calculation (min) 38 min    Equipment Utilized During Treatment Gait belt    Activity Tolerance Patient tolerated treatment well    Behavior During Therapy Flat affect;Restless             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   Mild, related to stroke history   Nocturia    Renal insufficiency 06/25/2013   S/P carotid endarterectomy    BILATERAL ICA--  PATENT PER DUPLEX  05-19-2012   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   Right calf   Urinary frequency    Vitamin D deficiency     Past Surgical History:  Procedure Laterality Date   APPENDECTOMY  AS CHILD   CARDIOVASCULAR STRESS TEST  03-27-2012  DR CRENSHAW   LOW RISK LEXISCAN STUDY-- PROBABLE NORMAL PERFUSION AND SOFT TISSUE ATTENUATION/  NO ISCHEMIA/ EF 51%   CAROTID ENDARTERECTOMY Bilateral LEFT  11-12-2008  DR  GREG HAYES   RIGHT ICA  2006  ([facility])   CYSTOSCOPY W/ RETROGRADES Bilateral 06/22/2021   Procedure: CYSTOSCOPY WITH RETROGRADE PYELOGRAM;  Surgeon: Franchot Gallo, MD;  Location: [facility];  Service: Urology;  Laterality: Bilateral;   CYSTOSCOPY WITH LITHOLAPAXY N/A 02/26/2013   Procedure: CYSTOSCOPY WITH LITHOLAPAXY;  Surgeon: Franchot Gallo, MD;  Location: [facility];  Service: Urology;  Laterality: N/A;   EYE SURGERY  Jan. 2016   cataract surgery both eyes   INGUINAL HERNIA REPAIR Right 11-08-2006   IR KYPHO EA ADDL LEVEL THORACIC OR LUMBAR  02/12/2021   IR RADIOLOGIST EVAL & MGMT  02/18/2021   MASS EXCISION N/A 03/03/2016   Procedure: EXCISION OF BACK  MASS;  Surgeon: Stark Klein, MD;  Location: [facility];  Service: General;  Laterality: N/A;   MOHS SURGERY Left 1/ 2016   Dr Nevada Crane-- Basal cell   PROSTATE SURGERY     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-C N/A 06/22/2021   Procedure: TRANSURETHRAL RESECTION OF BLADDER TUMOR;  Surgeon: Franchot Gallo, MD;  Location: [facility];  Service: Urology;  Laterality: N/A;   TRANSURETHRAL RESECTION OF PROSTATE N/A 02/26/2013   Procedure: TRANSURETHRAL RESECTION OF THE PROSTATE WITH GYRUS INSTRUMENTS;  Surgeon: Franchot Gallo,  MD;  Location: [facility];  Service: Urology;  Laterality: N/A;   TRANSURETHRAL RESECTION OF PROSTATE N/A 06/22/2021   Procedure: TRANSURETHRAL RESECTION OF THE PROSTATE (TURP);  Surgeon: Franchot Gallo, MD;  Location: [facility];  Service: Urology;  Laterality: N/A;    There were no vitals filed for this visit.                  [place] Adult PT Treatment/Exercise - 02/11/22 0001       Ambulation/Gait   Gait Comments Walked outside on unlevel surfaces, CGA, HHA while managing curb. As he fatigued he started to drag feet and required increase dcueing to take longer steps      High Level  Balance   High Level Balance Comments Standing weight shifts. stand with back to mat, reach to R iwth weight shift, picking up a cone, turn to L, weight shift onto LLE and reah back with LUE to place the cone on mat. 6 reps each direction iwth improved weight shifts as he progressed.      Lumbar Exercises: Aerobic   Nustep L4 x 5 minutes including 2 x 45 sec fast with legs only.      Lumbar Exercises: Standing   Other Standing Lumbar Exercises lateral weight shifts in parallel bars, moving hips fro mside to side to touch the bar on either side, then placed R foot on towel and slide it side to side, HHA, repeated  iwth LLE on towel.    Other Standing Lumbar Exercises B side stepping over half bolster on the floor. He had difficulty stepping wide enough and weight hsifting to bring the second leg across, improved with repetition, but diffiuclt to sustain                       PT Short Term Goals - 12/14/21 1451       PT SHORT TERM GOAL #1   Title independent with initial HEP    Time 2    Period Weeks    Status On-going               PT Long Term Goals - 02/10/22 1458       PT LONG TERM GOAL #2   Title decrease TUG time to 13 seconds    Status Partially Met                   Plan - 02/11/22 1623     Clinical Impression Statement Patient was very distractible today. He had great difficulty weight shifting laterally to either side, performed multiple activities to facilitate this. He did seem to improve, will need to assess if there is any carryove.    PT Next Visit Plan Progressive strength/ROM L shoulder, balance gait    Consulted and Agree with Plan of Care Patient;Family member/caregiver    Family Member Consulted wife [name]             Patient will benefit from skilled therapeutic intervention in order to improve the following deficits and impairments:  Abnormal gait, Decreased coordination, Decreased range of motion, Difficulty walking,  Increased muscle spasms, Cardiopulmonary status limiting activity, Decreased endurance, Decreased activity tolerance, Pain, Impaired flexibility, Decreased balance, Decreased mobility, Decreased strength, Postural dysfunction, Increased edema  Visit Diagnosis: History of falling  Muscle weakness (generalized)  Repeated falls  Other abnormalities of gait and mobility  Difficulty in walking, not elsewhere classified  Hemiplegia and hemiparesis following cerebral infarction  affecting left non-dominant side ([place])  Other lack of coordination     Problem List Patient Active Problem List   Diagnosis Date Noted   Left elbow pain 01/26/2022   Mid back pain on left side 01/26/2022   Rib pain 01/26/2022   Acute pain of left shoulder 11/12/2021   Leukocytes in urine 11/12/2021   Urinary frequency 11/12/2021   Thrush 10/08/2021   Hemiplegia, dominant side S/P CVA (cerebrovascular accident) ([place]) 09/11/2021   Insomnia    Prediabetes    Chronic kidney disease (CKD), stage IV (severe) (HCC)    Basal ganglia infarction ([place]) 07/29/2021   Transaminitis 07/27/2021   UTI (urinary tract infection) 07/27/2021   CVA (cerebral vascular accident) ([place]) 07/27/2021   Fall 07/27/2021   Hyperglycemia 07/27/2021   CKD (chronic kidney disease), stage IV ([place]) 07/27/2021   Cholelithiasis 07/27/2021   Hypoxia 07/27/2021   Nausea and vomiting 07/68/0881   Acute metabolic encephalopathy 04/13/5944   Normocytic anemia 07/27/2021   Chronic back pain 07/27/2021   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[place]) 06/22/2021   Closed fracture of first lumbar vertebra with routine healing 02/03/2021   Closed fracture of multiple ribs 11/18/2020   Anxiety 01/29/2020   Leg pain, bilateral 01/29/2020   Ingrown toenail 07/13/2019   Lumbar spondylosis 05/02/2018   Pain in left knee 03/09/2018   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   Trochanteric bursitis of left hip 01/16/2018   Preventative  health care 09/26/2017   HTN (hypertension) 07/19/2015   Hyperlipidemia 07/19/2015   Great toe pain 02/11/2014   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 [place], [name], [address] Phone: [phone]   Fax:  [phone]  Name: William [name] MRN: [id number] Date of Birth: [birth year]

## 2022-02-16 ENCOUNTER — Encounter: Payer: Self-pay | Admitting: Occupational Therapy

## 2022-02-16 ENCOUNTER — Other Ambulatory Visit (HOSPITAL_BASED_OUTPATIENT_CLINIC_OR_DEPARTMENT_OTHER): Payer: Self-pay

## 2022-02-16 ENCOUNTER — Ambulatory Visit: Payer: PPO | Attending: Family Medicine | Admitting: Occupational Therapy

## 2022-02-16 DIAGNOSIS — I63511 Cerebral infarction due to unspecified occlusion or stenosis of right middle cerebral artery: Secondary | ICD-10-CM | POA: Diagnosis not present

## 2022-02-16 DIAGNOSIS — E876 Hypokalemia: Secondary | ICD-10-CM | POA: Diagnosis not present

## 2022-02-16 DIAGNOSIS — M1990 Unspecified osteoarthritis, unspecified site: Secondary | ICD-10-CM | POA: Diagnosis not present

## 2022-02-16 DIAGNOSIS — R2689 Other abnormalities of gait and mobility: Secondary | ICD-10-CM | POA: Insufficient documentation

## 2022-02-16 DIAGNOSIS — F419 Anxiety disorder, unspecified: Secondary | ICD-10-CM | POA: Diagnosis not present

## 2022-02-16 DIAGNOSIS — K8050 Calculus of bile duct without cholangitis or cholecystitis without obstruction: Secondary | ICD-10-CM | POA: Diagnosis not present

## 2022-02-16 DIAGNOSIS — Z66 Do not resuscitate: Secondary | ICD-10-CM | POA: Diagnosis present

## 2022-02-16 DIAGNOSIS — G20 Parkinson's disease: Secondary | ICD-10-CM | POA: Diagnosis not present

## 2022-02-16 DIAGNOSIS — Z9079 Acquired absence of other genital organ(s): Secondary | ICD-10-CM | POA: Diagnosis not present

## 2022-02-16 DIAGNOSIS — I69359 Hemiplegia and hemiparesis following cerebral infarction affecting unspecified side: Secondary | ICD-10-CM | POA: Diagnosis not present

## 2022-02-16 DIAGNOSIS — E669 Obesity, unspecified: Secondary | ICD-10-CM | POA: Diagnosis present

## 2022-02-16 DIAGNOSIS — N1832 Chronic kidney disease, stage 3b: Secondary | ICD-10-CM | POA: Diagnosis not present

## 2022-02-16 DIAGNOSIS — A419 Sepsis, unspecified organism: Secondary | ICD-10-CM | POA: Diagnosis present

## 2022-02-16 DIAGNOSIS — Z86008 Personal history of in-situ neoplasm of other site: Secondary | ICD-10-CM | POA: Diagnosis not present

## 2022-02-16 DIAGNOSIS — R41840 Attention and concentration deficit: Secondary | ICD-10-CM | POA: Insufficient documentation

## 2022-02-16 DIAGNOSIS — Z79899 Other long term (current) drug therapy: Secondary | ICD-10-CM | POA: Diagnosis not present

## 2022-02-16 DIAGNOSIS — K851 Biliary acute pancreatitis without necrosis or infection: Secondary | ICD-10-CM | POA: Diagnosis present

## 2022-02-16 DIAGNOSIS — K838 Other specified diseases of biliary tract: Secondary | ICD-10-CM | POA: Diagnosis not present

## 2022-02-16 DIAGNOSIS — R932 Abnormal findings on diagnostic imaging of liver and biliary tract: Secondary | ICD-10-CM | POA: Diagnosis not present

## 2022-02-16 DIAGNOSIS — K859 Acute pancreatitis without necrosis or infection, unspecified: Secondary | ICD-10-CM | POA: Diagnosis not present

## 2022-02-16 DIAGNOSIS — Z8551 Personal history of malignant neoplasm of bladder: Secondary | ICD-10-CM | POA: Diagnosis not present

## 2022-02-16 DIAGNOSIS — I129 Hypertensive chronic kidney disease with stage 1 through stage 4 chronic kidney disease, or unspecified chronic kidney disease: Secondary | ICD-10-CM | POA: Diagnosis present

## 2022-02-16 DIAGNOSIS — N401 Enlarged prostate with lower urinary tract symptoms: Secondary | ICD-10-CM | POA: Diagnosis not present

## 2022-02-16 DIAGNOSIS — N179 Acute kidney failure, unspecified: Secondary | ICD-10-CM | POA: Diagnosis present

## 2022-02-16 DIAGNOSIS — Z9181 History of falling: Secondary | ICD-10-CM | POA: Insufficient documentation

## 2022-02-16 DIAGNOSIS — I69354 Hemiplegia and hemiparesis following cerebral infarction affecting left non-dominant side: Secondary | ICD-10-CM | POA: Diagnosis not present

## 2022-02-16 DIAGNOSIS — N400 Enlarged prostate without lower urinary tract symptoms: Secondary | ICD-10-CM | POA: Diagnosis present

## 2022-02-16 DIAGNOSIS — F0390 Unspecified dementia without behavioral disturbance: Secondary | ICD-10-CM | POA: Diagnosis not present

## 2022-02-16 DIAGNOSIS — E785 Hyperlipidemia, unspecified: Secondary | ICD-10-CM | POA: Diagnosis present

## 2022-02-16 DIAGNOSIS — E860 Dehydration: Secondary | ICD-10-CM | POA: Diagnosis present

## 2022-02-16 DIAGNOSIS — Z87891 Personal history of nicotine dependence: Secondary | ICD-10-CM | POA: Diagnosis not present

## 2022-02-16 DIAGNOSIS — E559 Vitamin D deficiency, unspecified: Secondary | ICD-10-CM | POA: Diagnosis present

## 2022-02-16 DIAGNOSIS — R278 Other lack of coordination: Secondary | ICD-10-CM | POA: Insufficient documentation

## 2022-02-16 DIAGNOSIS — K8062 Calculus of gallbladder and bile duct with acute cholecystitis without obstruction: Secondary | ICD-10-CM | POA: Diagnosis present

## 2022-02-16 DIAGNOSIS — N281 Cyst of kidney, acquired: Secondary | ICD-10-CM | POA: Diagnosis not present

## 2022-02-16 DIAGNOSIS — D649 Anemia, unspecified: Secondary | ICD-10-CM | POA: Diagnosis present

## 2022-02-16 DIAGNOSIS — Z85828 Personal history of other malignant neoplasm of skin: Secondary | ICD-10-CM | POA: Diagnosis not present

## 2022-02-16 DIAGNOSIS — K567 Ileus, unspecified: Secondary | ICD-10-CM | POA: Diagnosis not present

## 2022-02-16 DIAGNOSIS — S2249XA Multiple fractures of ribs, unspecified side, initial encounter for closed fracture: Secondary | ICD-10-CM | POA: Diagnosis not present

## 2022-02-16 DIAGNOSIS — I6381 Other cerebral infarction due to occlusion or stenosis of small artery: Secondary | ICD-10-CM | POA: Diagnosis not present

## 2022-02-16 DIAGNOSIS — R935 Abnormal findings on diagnostic imaging of other abdominal regions, including retroperitoneum: Secondary | ICD-10-CM | POA: Diagnosis not present

## 2022-02-16 DIAGNOSIS — Z7902 Long term (current) use of antithrombotics/antiplatelets: Secondary | ICD-10-CM | POA: Diagnosis not present

## 2022-02-16 DIAGNOSIS — I69318 Other symptoms and signs involving cognitive functions following cerebral infarction: Secondary | ICD-10-CM | POA: Insufficient documentation

## 2022-02-16 DIAGNOSIS — I10 Essential (primary) hypertension: Secondary | ICD-10-CM | POA: Diagnosis not present

## 2022-02-16 DIAGNOSIS — R296 Repeated falls: Secondary | ICD-10-CM | POA: Insufficient documentation

## 2022-02-16 DIAGNOSIS — K9189 Other postprocedural complications and disorders of digestive system: Secondary | ICD-10-CM | POA: Diagnosis not present

## 2022-02-16 DIAGNOSIS — K819 Cholecystitis, unspecified: Secondary | ICD-10-CM | POA: Diagnosis not present

## 2022-02-16 DIAGNOSIS — K8010 Calculus of gallbladder with chronic cholecystitis without obstruction: Secondary | ICD-10-CM | POA: Diagnosis not present

## 2022-02-16 DIAGNOSIS — Z8249 Family history of ischemic heart disease and other diseases of the circulatory system: Secondary | ICD-10-CM | POA: Diagnosis not present

## 2022-02-16 DIAGNOSIS — R262 Difficulty in walking, not elsewhere classified: Secondary | ICD-10-CM | POA: Insufficient documentation

## 2022-02-16 DIAGNOSIS — K219 Gastro-esophageal reflux disease without esophagitis: Secondary | ICD-10-CM | POA: Diagnosis present

## 2022-02-16 DIAGNOSIS — N184 Chronic kidney disease, stage 4 (severe): Secondary | ICD-10-CM | POA: Diagnosis present

## 2022-02-16 DIAGNOSIS — M6281 Muscle weakness (generalized): Secondary | ICD-10-CM | POA: Insufficient documentation

## 2022-02-16 DIAGNOSIS — K8020 Calculus of gallbladder without cholecystitis without obstruction: Secondary | ICD-10-CM | POA: Diagnosis not present

## 2022-02-16 DIAGNOSIS — F01A0 Vascular dementia, mild, without behavioral disturbance, psychotic disturbance, mood disturbance, and anxiety: Secondary | ICD-10-CM | POA: Diagnosis present

## 2022-02-16 DIAGNOSIS — M47816 Spondylosis without myelopathy or radiculopathy, lumbar region: Secondary | ICD-10-CM | POA: Diagnosis not present

## 2022-02-16 DIAGNOSIS — K8070 Calculus of gallbladder and bile duct without cholecystitis without obstruction: Secondary | ICD-10-CM | POA: Diagnosis not present

## 2022-02-16 DIAGNOSIS — G9341 Metabolic encephalopathy: Secondary | ICD-10-CM | POA: Diagnosis not present

## 2022-02-16 NOTE — Therapy (Signed)
OUTPATIENT OCCUPATIONAL THERAPY TREATMENT NOTE   Patient Name: William [name] MRN: [id number] DOB:08/27/1940, 81 y.o., male Today's Date: 02/16/2022  PCP: Roma Schanz, DO REFERRING PROVIDER: Carollee Herter, Alferd Apa, DO   OT End of Session - 02/16/22 1407     Visit Number 31    Number of Visits 35    Date for OT Re-Evaluation 03/18/22    Authorization Type [employer]    Authorization Time Period VL: MN    OT Start Time 1400    OT Stop Time 1440    OT Time Calculation (min) 40 min    Activity Tolerance Patient tolerated treatment well    Behavior During Therapy Flat affect;Restless            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   Mild, related to stroke history   Nocturia    Renal insufficiency 06/25/2013   S/P carotid endarterectomy    BILATERAL ICA--  PATENT PER DUPLEX  05-19-2012   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/26/2017   Right calf   Urinary frequency    Vitamin D deficiency    Past Surgical History:  Procedure Laterality Date   APPENDECTOMY  AS CHILD   CARDIOVASCULAR STRESS TEST  03-27-2012  DR CRENSHAW   LOW RISK LEXISCAN STUDY-- PROBABLE NORMAL PERFUSION AND SOFT TISSUE ATTENUATION/  NO ISCHEMIA/ EF 51%   CAROTID ENDARTERECTOMY Bilateral LEFT  11-12-2008  DR GREG HAYES   RIGHT ICA  2006  ([facility])   CYSTOSCOPY W/ RETROGRADES  Bilateral 06/22/2021   Procedure: CYSTOSCOPY WITH RETROGRADE PYELOGRAM;  Surgeon: Franchot Gallo, MD;  Location: [facility];  Service: Urology;  Laterality: Bilateral;   CYSTOSCOPY WITH LITHOLAPAXY N/A 02/26/2013   Procedure: CYSTOSCOPY WITH LITHOLAPAXY;  Surgeon: Franchot Gallo, MD;  Location: [facility];  Service: Urology;  Laterality: N/A;   EYE SURGERY  Jan. 2016   cataract surgery both eyes   INGUINAL HERNIA REPAIR Right 11-08-2006   IR KYPHO EA ADDL LEVEL THORACIC OR LUMBAR  02/12/2021   IR RADIOLOGIST EVAL & MGMT  02/18/2021   MASS EXCISION N/A 03/03/2016   Procedure: EXCISION OF BACK  MASS;  Surgeon: Stark Klein, MD;  Location: [facility];  Service: General;  Laterality: N/A;   MOHS SURGERY Left 1/ 2016   Dr Nevada Crane-- Basal cell   PROSTATE SURGERY     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-C N/A 06/22/2021   Procedure: TRANSURETHRAL RESECTION OF BLADDER TUMOR;  Surgeon: Franchot Gallo, MD;  Location: [facility];  Service: Urology;  Laterality: N/A;   TRANSURETHRAL RESECTION OF PROSTATE N/A 02/26/2013   Procedure: TRANSURETHRAL RESECTION OF THE PROSTATE WITH GYRUS INSTRUMENTS;  Surgeon: Franchot Gallo, MD;  Location: [facility];  Service: Urology;  Laterality: N/A;   TRANSURETHRAL RESECTION OF PROSTATE N/A 06/22/2021   Procedure: TRANSURETHRAL RESECTION OF THE PROSTATE (TURP);  Surgeon: Franchot Gallo, MD;  Location: [facility];  Service: Urology;  Laterality: N/A;   Patient Active Problem List   Diagnosis Date Noted   Left elbow pain 01/26/2022   Mid back pain on left side 01/26/2022   Rib pain 01/26/2022   Acute pain of left shoulder 11/12/2021   Leukocytes in urine 11/12/2021   Urinary frequency 11/12/2021   Thrush 10/08/2021   Hemiplegia, dominant side S/P CVA (cerebrovascular accident) ([place]) 09/11/2021   Insomnia    Prediabetes    Chronic kidney disease  (CKD), stage IV (severe) (HCC)    Basal ganglia infarction ([place]) 07/29/2021   Transaminitis 07/27/2021   UTI (urinary tract infection) 07/27/2021   CVA (cerebral vascular accident) ([place]) 07/27/2021   Fall 07/27/2021   Hyperglycemia 07/27/2021   CKD (chronic kidney disease), stage IV ([place]) 07/27/2021   Cholelithiasis 07/27/2021   Hypoxia 07/27/2021   Nausea and vomiting 60/63/0160   Acute metabolic encephalopathy 10/93/2355   Normocytic anemia 07/27/2021   Chronic back pain 07/27/2021   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[place]) 06/22/2021   Closed fracture of first lumbar vertebra with routine healing 02/03/2021   Closed fracture of multiple ribs 11/18/2020   Anxiety 01/29/2020   Leg pain, bilateral 01/29/2020   Ingrown toenail 07/13/2019   Lumbar spondylosis 05/02/2018   Pain in left knee 03/09/2018   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   Trochanteric bursitis of left hip 01/16/2018   Preventative health care 09/26/2017   HTN (hypertension) 07/19/2015   Hyperlipidemia 07/19/2015   Great toe pain 02/11/2014   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   Obesity (BMI 30-39.9) 06/25/2013   Renal insufficiency 06/25/2013   Weakness of left arm 06/25/2013   Sebaceous cyst 03/03/2011   Sprain of lumbar region 07/31/2010   Rib pain, left 08/29/2009   Carotid artery stenosis, asymptomatic, bilateral 05/02/2009   Eczema, atopic 05/31/2008   Vitamin D deficiency 03/01/2008   BPH (benign prostatic hyperplasia) 08/06/2007   Fasting hyperglycemia 12/21/2006   History of right MCA infarct 2006    ONSET DATE: 07/27/21  REFERRING DIAG: R41.4 (ICD-10-CM) - Hemi-inattention I63.9 (ICD-10-CM) - Cerebrovascular accident (CVA), unspecified mechanism ([place])   THERAPY DIAG:  Attention and concentration deficit  Other lack of coordination  Other symptoms and signs involving cognitive functions following cerebral infarction  History of falling  Other  abnormalities of gait and mobility  PERTINENT HISTORY: L basal ganglia and corona radiata infarct 07/27/21 (had documented AMS for about 5 days prior w/ hospital work-up including MRI negative and symptoms attributed to a UTI); L lower homonymous quadrantanopia. Other PMH includes R MCA infarct in 2006 w/ L-sided hemiparesis, NCD, carotid artery disease (bilateral), HTN, HLD,CKD3, and hx of bladder cancer  PRECAUTIONS: Fall risk  SUBJECTIVE:   SUBJECTIVE ASSESSMENT: Pt's wife reports he had a few episodes of leaning to the left after therapy last Thursday and was not sure if there is anything he did in therapy that would have caused that; has since resolved  PAIN: Are you having pain? No   OBJECTIVE:   TODAY'S TREATMENT - 02/16/22:  ADLs Practiced donning overhead shirt, focusing on first step of pulling it over head only as this is what is difficult at home, per spouse report. Pt required redirection to maintain attention to task and verbal cues and HOHA to pull shirt completely over head. OT graded  task by handing pt shirt w/ collar bunched up w/ back of shirt for increased ease w/ positive results. Able to complete this step w/ setup assist consistently w/ practice. Multiple attempts.  PV[employer] Pipe [employer] Constructional activity, connecting PVC pipe tree pieces together to facilitate bilateral integration, L-sided attention, and processing; pt able to complete activity w/ min verbal cues to encourage use of LUE thoughout    PATIENT EDUCATION: Ongoing condition-specific education related to therapeutic interventions completed this session Person educated: Patient and Spouse Education method: Explanation Education comprehension: verbalized understanding   GOALS: Goals reviewed with patient? Yes   SHORT TERM GOALS: Target date: 12/29/2021   STG   Status:  1 Pt/caregiver will verbalize understanding of visual perceptual compensatory strategies Baseline: Decreased knowledge of visual  deficits and strategies Met - 12/31/21  2 Pt will complete symbol cancellation task w/ at least 50% accuracy using visual compensatory strategies to indicate improved attention Baseline: 40% accuracy w/ multiple errors Met - 11/17/21  3 Pt will participate in bilateral coordination activity w/ no more than 3 cues for incorporation of LUE Baseline: L-sided inattention Met - 12/22/21: completed task w/ no cues for bil integration      LONG TERM GOALS: Target date: 01/13/2022   LTG   Status:  1 Pt will be able to don overhead shirt w/ SPV and verbal/gestural cues prn in at least 1 attempt Baseline: Min A to don/doff shirt sleeve on L side Progressing  2 Pt will be able to thread bottoms on BLEs or don socks w/ Mod A Baseline: Max A for LB dressing Met - 01/07/22   Pt will demonstrate donning bottoms, incorporating use of AE prn, w/ SPV for safety in at least 2 attempts Baseline: Min A to thread BLE on 01/07/22 Met - 01/21/22  3 Pt will demonstrate supine-to-sit transition w/ cues from caregiver and incorporation of AE/DME prn Baseline: Mod-Max A w/ bed mobility Met - 12/03/21; SPV w/ bed ladder and cues  4 Pt will be able to use TV remote (or adaptive remote) to complete 1 task (e.g., turn TV on/of, turn volume up/down) w/ SPV Baseline: Difficulty  Not Progressing - d/c due to continued difficulty and agitation w/ task at home  5 Pt will increase Barthel Index score by at least 5 to indicate improved level of participation in BADLs Baseline: 55/100 Progressing  6 Pt will be able to make a peanut butter and jelly sandwich w/ SPV and verbal/tactile cues prn Progressing     ASSESSMENT:   CLINICAL IMPRESSION: OT continued to address attention to task, sequencing, and L-sided awareness, incorporating familiar functional tasks to facilitate success. Increased success w/ set-up assist for donning shirt overhead compared to attempting w/ SPV and verbal cues only, likely due to limitations w/ attention to task  and problem-solving. Pt did demonstrate improved spontaneous use of LUE inconsistently during therapeutic activity today. Pt benefited from blocked practice, assist for errorless learning, auditory/verbal/tactile cues, and immediate feedback and knowledge of results for success based on current EBP for patients w/ NCD. Skilled OT services will continue to be beneficial to maintain functional participation in daily tasks and help reduce spouse/caregiver strain.   PERFORMANCE DEFICITS in functional skills including ADLs, IADLs, coordination, dexterity, [place], G[facility], mobility, balance, and vision, cognitive skills including attention, memory, orientation, problem solving, safety awareness, sequencing, and temperament/personality, and psychosocial skills including environmental adaptation, interpersonal interactions, and routines and behaviors.    IMPAIRMENTS are limiting patient from ADLs, IADLs, rest and sleep,  leisure, and social participation.    COMORBIDITIES may have co-morbidities that affects occupational performance. Patient will benefit from skilled OT to address above impairments and improve overall function.     PLAN: OT FREQUENCY: 2x/week   OT DURATION: 10 weeks   PLANNED INTERVENTIONS: self care/ADL training, therapeutic exercise, therapeutic activity, neuromuscular re-education, balance training, functional mobility training, aquatic therapy, splinting, moist heat, cryotherapy, patient/family education, cognitive remediation/compensation, visual/perceptual remediation/compensation, psychosocial skills training, energy conservation, and DME and/or AE instructions   RECOMMENDED OTHER SERVICES: Currently receiving PT services at this location   CONSULTED AND AGREED WITH PLAN OF CARE: Patient and family member/caregiver   PLAN FOR NEXT SESSION: Preparatory exercise(s) for L-sided attention, proprioception and kinesthetic sense prior to practice of ADL tasks; continue to address bilateral  coordination and attention to task   Kathrine Cords, MSOT, OTR/L 02/16/22, 2:44 PM

## 2022-02-17 ENCOUNTER — Encounter: Payer: Self-pay | Admitting: Physical Therapy

## 2022-02-17 ENCOUNTER — Other Ambulatory Visit (HOSPITAL_BASED_OUTPATIENT_CLINIC_OR_DEPARTMENT_OTHER): Payer: Self-pay

## 2022-02-17 ENCOUNTER — Ambulatory Visit: Payer: PPO | Admitting: Physical Therapy

## 2022-02-17 DIAGNOSIS — I69318 Other symptoms and signs involving cognitive functions following cerebral infarction: Secondary | ICD-10-CM

## 2022-02-17 DIAGNOSIS — R2689 Other abnormalities of gait and mobility: Secondary | ICD-10-CM

## 2022-02-17 DIAGNOSIS — Z9181 History of falling: Secondary | ICD-10-CM

## 2022-02-17 DIAGNOSIS — R278 Other lack of coordination: Secondary | ICD-10-CM

## 2022-02-17 DIAGNOSIS — R41840 Attention and concentration deficit: Secondary | ICD-10-CM

## 2022-02-17 DIAGNOSIS — R262 Difficulty in walking, not elsewhere classified: Secondary | ICD-10-CM

## 2022-02-17 DIAGNOSIS — R296 Repeated falls: Secondary | ICD-10-CM

## 2022-02-17 DIAGNOSIS — I69354 Hemiplegia and hemiparesis following cerebral infarction affecting left non-dominant side: Secondary | ICD-10-CM

## 2022-02-17 DIAGNOSIS — M6281 Muscle weakness (generalized): Secondary | ICD-10-CM

## 2022-02-17 NOTE — Therapy (Signed)
[place] [place], [name], [address] Phone: [phone]   Fax:  [phone]  Physical Therapy Treatment  Progress Note Reporting Period 11/21/21 to 02/17/22  See note below for Objective Data and Assessment of Progress/Goals.      Patient Details  Name: William [name] MRN: [id number] Date of Birth: Nov 06, 1940 Referring Provider (PT): Supple   Encounter Date: 02/17/2022   PT End of Session - 02/17/22 1551     Visit Number 40    Date for PT Re-Evaluation 03/04/22    Authorization Type HTA    PT Start Time 1546    PT Stop Time 1626    PT Time Calculation (min) 40 min    Equipment Utilized During Treatment Gait belt    Activity Tolerance Patient tolerated treatment well    Behavior During Therapy Flat affect;Restless             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   Mild, related to stroke history   Nocturia    Renal insufficiency 06/25/2013   S/P carotid endarterectomy    BILATERAL ICA--  PATENT PER DUPLEX  05-19-2012   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   Right calf   Urinary frequency    Vitamin D deficiency     Past Surgical History:  Procedure Laterality Date   APPENDECTOMY  AS CHILD   CARDIOVASCULAR STRESS TEST  03-27-2012  DR CRENSHAW   LOW RISK LEXISCAN STUDY--  PROBABLE NORMAL PERFUSION AND SOFT TISSUE ATTENUATION/  NO ISCHEMIA/ EF 51%   CAROTID ENDARTERECTOMY Bilateral LEFT  11-12-2008  DR GREG HAYES   RIGHT ICA  2006  ([facility])   CYSTOSCOPY W/ RETROGRADES Bilateral 06/22/2021   Procedure: CYSTOSCOPY WITH RETROGRADE PYELOGRAM;  Surgeon: Franchot Gallo, MD;  Location: [facility];  Service: Urology;  Laterality: Bilateral;   CYSTOSCOPY WITH LITHOLAPAXY N/A 02/26/2013   Procedure: CYSTOSCOPY WITH LITHOLAPAXY;  Surgeon: Franchot Gallo, MD;  Location: [facility];  Service: Urology;  Laterality: N/A;   EYE SURGERY  Jan. 2016   cataract surgery both eyes   INGUINAL HERNIA REPAIR Right 11-08-2006   IR KYPHO EA ADDL LEVEL THORACIC OR LUMBAR  02/12/2021   IR RADIOLOGIST EVAL & MGMT  02/18/2021   MASS EXCISION N/A 03/03/2016   Procedure: EXCISION OF BACK  MASS;  Surgeon: Stark Klein, MD;  Location: [facility];  Service: General;  Laterality: N/A;   MOHS SURGERY Left 1/ 2016   Dr Nevada Crane-- Basal cell   PROSTATE SURGERY     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-C N/A 06/22/2021   Procedure: TRANSURETHRAL RESECTION OF BLADDER TUMOR;  Surgeon: Franchot Gallo, MD;  Location: [facility];  Service: Urology;  Laterality: N/A;  TRANSURETHRAL RESECTION OF PROSTATE N/A 02/26/2013   Procedure: TRANSURETHRAL RESECTION OF THE PROSTATE WITH GYRUS INSTRUMENTS;  Surgeon: Franchot Gallo, MD;  Location: [facility];  Service: Urology;  Laterality: N/A;   TRANSURETHRAL RESECTION OF PROSTATE N/A 06/22/2021   Procedure: TRANSURETHRAL RESECTION OF THE PROSTATE (TURP);  Surgeon: Franchot Gallo, MD;  Location: [facility];  Service: Urology;  Laterality: N/A;    There were no vitals filed for this visit.   Subjective Assessment - 02/17/22 1551     Subjective Patient reports no hip pain, no recent falls. L shoulder pain noted.                [facility] PT  Assessment - 02/17/22 0001       Berg Balance Test   Sit to Stand Able to stand  independently using hands    Standing Unsupported Able to stand safely 2 minutes    Sitting with Back Unsupported but Feet Supported on Floor or Stool Able to sit safely and securely 2 minutes    Stand to Sit Controls descent by using hands    Transfers Able to transfer safely, definite need of hands    Standing Unsupported with Eyes Closed Able to stand 10 seconds safely    Standing Unsupported with Feet Together Able to place feet together independently and stand 1 minute safely    From Standing, Reach Forward with Outstretched Arm Can reach confidently >25 cm (10")    From Standing Position, Pick up Object from Floor Able to pick up shoe, needs supervision    From Standing Position, Turn to Look Behind Over each Shoulder Turn sideways only but maintains balance    Turn 360 Degrees Needs close supervision or verbal cueing    Standing Unsupported, Alternately Place Feet on Step/Stool Able to complete >2 steps/needs minimal assist    Standing Unsupported, One Foot in Front Needs help to step but can hold 15 seconds    Standing on One Leg Tries to lift leg/unable to hold 3 seconds but remains standing independently    Total Score 38                           [facility] Adult PT Treatment/Exercise - 02/17/22 0001       Lumbar Exercises: Seated   Sit to Stand Limitations Practiced forward wieght shifts iwth shopping cart. Progressed to sit to stand on airex pad. Improved balance and ease of standing.                       PT Short Term Goals - 02/17/22 1554       PT SHORT TERM GOAL #1   Title independent with initial HEP    Baseline 9/6- Wife performs informal HEP with him at home.    Time 2    Period Weeks    Status Achieved               PT Long Term Goals - 02/17/22 1557       PT LONG TERM GOAL #1   Title Understand fall risks, the importance of exercise and  flexibility to health and function    Baseline Wife can verbalize understanding.    Status Achieved      PT LONG TERM GOAL #2   Title decrease TUG time to 13 seconds    Baseline 02/17/22-11.64    Status Achieved  PT LONG TERM GOAL #3   Title walk around our building without rest     Baseline Ambulated outdoors x approximately 400' on unlevel surfaces, CGA, fatigued at the end.    Status On-going      PT LONG TERM GOAL #4   Title increase LE strength to 4+/5    Status Revised   See Goal #9     PT LONG TERM GOAL #5   Title increase Berg blance test score to 46/56    Baseline 02/17/22-38    Status On-going      PT LONG TERM GOAL #6   Title decrease left shoulder pain 50%    Baseline 02/17/22-Pain unchanged    Status On-going      PT LONG TERM GOAL #7   Title able to get up from supine and from sitting without help    Baseline 02/17/22 Patient performs bed mobiity MI, sit to stand iwth S and VC    Status Achieved      PT LONG TERM GOAL #8   Title Increase L shoulder flexion and abd to at least 120    Baseline PROM 85 flex, abd 62    Status On-going      PT LONG TERM GOAL  #9   TITLE Increase L shoulder strength to at least 3+/5 throughout    Baseline 3-/5    Status On-going                    Patient will benefit from skilled therapeutic intervention in order to improve the following deficits and impairments:     Visit Diagnosis: Attention and concentration deficit  Other lack of coordination  Other symptoms and signs involving cognitive functions following cerebral infarction  History of falling  Other abnormalities of gait and mobility  Muscle weakness (generalized)  Repeated falls  Difficulty in walking, not elsewhere classified  Hemiplegia and hemiparesis following cerebral infarction affecting left non-dominant side [facility])     Problem List Patient Active Problem List   Diagnosis Date Noted   Left elbow pain 01/26/2022   Mid back pain on  left side 01/26/2022   Rib pain 01/26/2022   Acute pain of left shoulder 11/12/2021   Leukocytes in urine 11/12/2021   Urinary frequency 11/12/2021   Thrush 10/08/2021   Hemiplegia, dominant side S/P CVA (cerebrovascular accident) ([place]) 09/11/2021   Insomnia    Prediabetes    Chronic kidney disease (CKD), stage IV (severe) (HCC)    Basal ganglia infarction ([place]) 07/29/2021   Transaminitis 07/27/2021   UTI (urinary tract infection) 07/27/2021   CVA (cerebral vascular accident) ([place]) 07/27/2021   Fall 07/27/2021   Hyperglycemia 07/27/2021   CKD (chronic kidney disease), stage IV ([place]) 07/27/2021   Cholelithiasis 07/27/2021   Hypoxia 07/27/2021   Nausea and vomiting 94/76/5465   Acute metabolic encephalopathy 03/54/6568   Normocytic anemia 07/27/2021   Chronic back pain 07/27/2021   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[place]) 06/22/2021   Closed fracture of first lumbar vertebra with routine healing 02/03/2021   Closed fracture of multiple ribs 11/18/2020   Anxiety 01/29/2020   Leg pain, bilateral 01/29/2020   Ingrown toenail 07/13/2019   Lumbar spondylosis 05/02/2018   Pain in left knee 03/09/2018   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   Trochanteric bursitis of left hip 01/16/2018   Preventative health care 09/26/2017   HTN (hypertension) 07/19/2015   Hyperlipidemia 07/19/2015   Great toe pain 02/11/2014   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 [place], [name], [address] Phone: [phone]   Fax:  [phone]  Name: William [name] MRN: [id number] Date of Birth: 15-Dec-1940

## 2022-02-18 ENCOUNTER — Ambulatory Visit: Payer: PPO | Admitting: Occupational Therapy

## 2022-02-18 ENCOUNTER — Ambulatory Visit: Payer: PPO | Admitting: Physical Therapy

## 2022-02-18 ENCOUNTER — Encounter: Payer: Self-pay | Admitting: Physical Therapy

## 2022-02-18 DIAGNOSIS — Z9181 History of falling: Secondary | ICD-10-CM

## 2022-02-18 DIAGNOSIS — I69318 Other symptoms and signs involving cognitive functions following cerebral infarction: Secondary | ICD-10-CM

## 2022-02-18 DIAGNOSIS — N184 Chronic kidney disease, stage 4 (severe): Secondary | ICD-10-CM | POA: Diagnosis not present

## 2022-02-18 DIAGNOSIS — G20 Parkinson's disease: Secondary | ICD-10-CM | POA: Diagnosis not present

## 2022-02-18 DIAGNOSIS — G9341 Metabolic encephalopathy: Secondary | ICD-10-CM | POA: Diagnosis not present

## 2022-02-18 DIAGNOSIS — S2249XA Multiple fractures of ribs, unspecified side, initial encounter for closed fracture: Secondary | ICD-10-CM | POA: Diagnosis not present

## 2022-02-18 DIAGNOSIS — I6381 Other cerebral infarction due to occlusion or stenosis of small artery: Secondary | ICD-10-CM | POA: Diagnosis not present

## 2022-02-18 DIAGNOSIS — R296 Repeated falls: Secondary | ICD-10-CM

## 2022-02-18 DIAGNOSIS — M47816 Spondylosis without myelopathy or radiculopathy, lumbar region: Secondary | ICD-10-CM | POA: Diagnosis not present

## 2022-02-18 DIAGNOSIS — I69354 Hemiplegia and hemiparesis following cerebral infarction affecting left non-dominant side: Secondary | ICD-10-CM

## 2022-02-18 DIAGNOSIS — R278 Other lack of coordination: Secondary | ICD-10-CM

## 2022-02-18 DIAGNOSIS — R41840 Attention and concentration deficit: Secondary | ICD-10-CM

## 2022-02-18 DIAGNOSIS — M6281 Muscle weakness (generalized): Secondary | ICD-10-CM

## 2022-02-18 NOTE — Therapy (Signed)
[place]. [place], [name], [address] Phone: [phone]   Fax:  [phone]  Physical Therapy Treatment  Patient Details  Name: William [name] MRN: [id number] Date of Birth: [birth year] Referring Provider (PT): Supple   Encounter Date: 02/18/2022   PT End of Session - 02/18/22 1452     Visit Number 41    Date for PT Re-Evaluation 03/04/22    Authorization Type HTA    PT Start Time 3810    PT Stop Time 1526    PT Time Calculation (min) 39 min    Equipment Utilized During Treatment Gait belt    Activity Tolerance Patient tolerated treatment well    Behavior During Therapy Flat affect;Restless             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   Mild, related to stroke history   Nocturia    Renal insufficiency 06/25/2013   S/P carotid endarterectomy    BILATERAL ICA--  PATENT PER DUPLEX  05-19-2012   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   Right calf   Urinary frequency    Vitamin D deficiency     Past Surgical History:  Procedure Laterality Date   APPENDECTOMY  AS CHILD   CARDIOVASCULAR STRESS TEST  03-27-2012  DR CRENSHAW   LOW RISK LEXISCAN STUDY-- PROBABLE NORMAL PERFUSION AND SOFT TISSUE ATTENUATION/  NO ISCHEMIA/ EF 51%   CAROTID ENDARTERECTOMY Bilateral LEFT  11-12-2008  DR  GREG HAYES   RIGHT ICA  2006  ([facility])   CYSTOSCOPY W/ RETROGRADES Bilateral 06/22/2021   Procedure: CYSTOSCOPY WITH RETROGRADE PYELOGRAM;  Surgeon: Franchot Gallo, MD;  Location[facility];  Service: Urology;  Laterality: Bilateral;   CYSTOSCOPY WITH LITHOLAPAXY N/A 02/26/2013   Procedure: CYSTOSCOPY WITH LITHOLAPAXY;  Surgeon: Franchot Gallo, MD;  Location: [facility];  Service: Urology;  Laterality: N/A;   EYE SURGERY  Jan. 2016   cataract surgery both eyes   INGUINAL HERNIA REPAIR Right 11-08-2006   IR KYPHO EA ADDL LEVEL THORACIC OR LUMBAR  02/12/2021   IR RADIOLOGIST EVAL & MGMT  02/18/2021   MASS EXCISION N/A 03/03/2016   Procedure: EXCISION OF BACK  MASS;  Surgeon: Stark Klein, MD;  Location: [facility];  Service: General;  Laterality: N/A;   MOHS SURGERY Left 1/ 2016   Dr Nevada Crane-- Basal cell   PROSTATE SURGERY     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-C N/A 06/22/2021   Procedure: TRANSURETHRAL RESECTION OF BLADDER TUMOR;  Surgeon: Franchot Gallo, MD;  Location: [facility];  Service: Urology;  Laterality: N/A;   TRANSURETHRAL RESECTION OF PROSTATE N/A 02/26/2013   Procedure: TRANSURETHRAL RESECTION OF THE PROSTATE WITH GYRUS INSTRUMENTS;  Surgeon: Franchot Gallo,  MD;  Location: [facility];  Service: Urology;  Laterality: N/A;   TRANSURETHRAL RESECTION OF PROSTATE N/A 06/22/2021   Procedure: TRANSURETHRAL RESECTION OF THE PROSTATE (TURP);  Surgeon: Franchot Gallo, MD;  Location[facility];  Service: Urology;  Laterality: N/A;    There were no vitals filed for this visit.   Subjective Assessment - 02/18/22 1445     Subjective Patient reports no new issues.                               [place] Adult PT Treatment/Exercise - 02/18/22 0001       Ambulation/Gait   Gait Comments Ambulated 2 x 100' with CGA, improved posture and step length  after treatment activities.      Lumbar Exercises: Aerobic   Nustep L5 x 7 minutes      Lumbar Exercises: Machines for Strengthening   Cybex Knee Extension 5# left only 3x10    Cybex Knee Flexion 10# left only 3x10      Lumbar Exercises: Standing   Other Standing Lumbar Exercises Multiple balance activities iwth 2# weights on each ankle- hip abd, heel raises,  march, step taps over and back of a cone on the floor-required mod TC cues throughout the cone activitiy and mod VC throughout to keep all movements large. He then stepped over and back across 8" wide, 3" tall airex plank iwth BUe suppor tin parallel bars.                       PT Short Term Goals - 02/17/22 1554       PT SHORT TERM GOAL #1   Title independent with initial HEP    Baseline 9/6- Wife performs informal HEP with him at home.    Time 2    Period Weeks    Status Achieved               PT Long Term Goals - 02/17/22 1557       PT LONG TERM GOAL #1   Title Understand fall risks, the importance of exercise and flexibility to health and function    Baseline Wife can verbalize understanding.    Status Achieved      PT LONG TERM GOAL #2   Title decrease TUG time to 13 seconds    Baseline 02/17/22-11.64    Status Achieved      PT LONG TERM GOAL #3   Title walk around our building without rest     Baseline Ambulated outdoors x approximately 400' on unlevel surfaces, CGA, fatigued at the end.    Status On-going      PT LONG TERM GOAL #4   Title increase LE strength to 4+/5    Status Revised   See Goal #9     PT LONG TERM GOAL #5   Title increase Berg blance test score to 46/56    Baseline 02/17/22-38    Status On-going      PT LONG TERM GOAL #6   Title decrease left shoulder pain 50%    Baseline 02/17/22-Pain unchanged    Status On-going      PT LONG TERM GOAL #7   Title able to get up from supine and from sitting without help    Baseline 02/17/22 Patient performs bed mobiity MI, sit to  stand iwth S and VC    Status Achieved  PT LONG TERM GOAL #8   Title Increase L shoulder flexion and abd to at least 120    Baseline PROM 85 flex, abd 62    Status On-going      PT LONG TERM GOAL  #9   TITLE Increase L shoulder strength to at least 3+/5 throughout    Baseline 3-/5    Status On-going                   Plan - 02/18/22 1456     Clinical Impression Statement Patient reports no issues. Treatment emphasized big movements as patient tends to move in very small increments. He remains very distractible, which increases fall risk.    Stability/Clinical Decision Making Evolving/Moderate complexity    Clinical Decision Making Low    Rehab Potential Fair    PT Frequency 2x / week    PT Treatment/Interventions ADLs/Self Care Home Management;Electrical Stimulation;Moist Heat;Gait training;Neuromuscular re-education;Balance training;Therapeutic exercise;Therapeutic activities;Functional mobility training;Stair training;Patient/family education;Manual techniques;Vasopneumatic Device;Ultrasound    PT Next Visit Plan Progressive strength/ROM L shoulder, balance gait    Consulted and Agree with Plan of Care Patient;Family member/caregiver    Family Member Consulted wife [name]             Patient will benefit from skilled therapeutic intervention in order to improve the following deficits and impairments:  Abnormal gait, Decreased coordination, Decreased range of motion, Difficulty walking, Increased muscle spasms, Cardiopulmonary status limiting activity, Decreased endurance, Decreased activity tolerance, Pain, Impaired flexibility, Decreased balance, Decreased mobility, Decreased strength, Postural dysfunction, Increased edema  Visit Diagnosis: Attention and concentration deficit  Other lack of coordination  Other symptoms and signs involving cognitive functions following cerebral infarction  History of falling  Muscle weakness (generalized)  Repeated  falls  Hemiplegia and hemiparesis following cerebral infarction affecting left non-dominant side [facility])     Problem List Patient Active Problem List   Diagnosis Date Noted   Left elbow pain 01/26/2022   Mid back pain on left side 01/26/2022   Rib pain 01/26/2022   Acute pain of left shoulder 11/12/2021   Leukocytes in urine 11/12/2021   Urinary frequency 11/12/2021   Thrush 10/08/2021   Hemiplegia, dominant side S/P CVA (cerebrovascular accident) ([place]) 09/11/2021   Insomnia    Prediabetes    Chronic kidney disease (CKD), stage IV (severe) (HCC)    Basal ganglia infarction ([employer]) 07/29/2021   Transaminitis 07/27/2021   UTI (urinary tract infection) 07/27/2021   CVA (cerebral vascular accident) ([place]) 07/27/2021   Fall 07/27/2021   Hyperglycemia 07/27/2021   CKD (chronic kidney disease), stage IV ([place]) 07/27/2021   Cholelithiasis 07/27/2021   Hypoxia 07/27/2021   Nausea and vomiting 62/94/7654   Acute metabolic encephalopathy 65/08/5463   Normocytic anemia 07/27/2021   Chronic back pain 07/27/2021   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[place]) 06/22/2021   Closed fracture of first lumbar vertebra with routine healing 02/03/2021   Closed fracture of multiple ribs 11/18/2020   Anxiety 01/29/2020   Leg pain, bilateral 01/29/2020   Ingrown toenail 07/13/2019   Lumbar spondylosis 05/02/2018   Pain in left knee 03/09/2018   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   Trochanteric bursitis of left hip 01/16/2018   Preventative health care 09/26/2017   HTN (hypertension) 07/19/2015   Hyperlipidemia 07/19/2015   Great toe pain 02/11/2014   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 [place], [name], [address] Phone: [phone]   Fax:  [phone]  Name: William [name] MRN: [id number] Date of Birth: 06/22/1940

## 2022-02-19 ENCOUNTER — Other Ambulatory Visit: Payer: Self-pay

## 2022-02-19 ENCOUNTER — Emergency Department (HOSPITAL_COMMUNITY): Payer: PPO

## 2022-02-19 ENCOUNTER — Inpatient Hospital Stay (HOSPITAL_COMMUNITY)
Admission: EM | Admit: 2022-02-19 | Discharge: 2022-02-25 | DRG: 853 | Disposition: A | Discharge status: Home Health | Payer: PPO | Attending: Internal Medicine | Admitting: Internal Medicine

## 2022-02-19 ENCOUNTER — Encounter (HOSPITAL_COMMUNITY): Payer: Self-pay | Admitting: Internal Medicine

## 2022-02-19 ENCOUNTER — Telehealth: Payer: Self-pay

## 2022-02-19 DIAGNOSIS — Z66 Do not resuscitate: Secondary | ICD-10-CM | POA: Diagnosis present

## 2022-02-19 DIAGNOSIS — Z79899 Other long term (current) drug therapy: Secondary | ICD-10-CM

## 2022-02-19 DIAGNOSIS — E860 Dehydration: Secondary | ICD-10-CM | POA: Diagnosis present

## 2022-02-19 DIAGNOSIS — Z85828 Personal history of other malignant neoplasm of skin: Secondary | ICD-10-CM

## 2022-02-19 DIAGNOSIS — F01A0 Vascular dementia, mild, without behavioral disturbance, psychotic disturbance, mood disturbance, and anxiety: Secondary | ICD-10-CM | POA: Diagnosis present

## 2022-02-19 DIAGNOSIS — K8050 Calculus of bile duct without cholangitis or cholecystitis without obstruction: Secondary | ICD-10-CM | POA: Diagnosis not present

## 2022-02-19 DIAGNOSIS — D649 Anemia, unspecified: Secondary | ICD-10-CM | POA: Diagnosis present

## 2022-02-19 DIAGNOSIS — I639 Cerebral infarction, unspecified: Secondary | ICD-10-CM | POA: Diagnosis present

## 2022-02-19 DIAGNOSIS — Z7902 Long term (current) use of antithrombotics/antiplatelets: Secondary | ICD-10-CM | POA: Diagnosis not present

## 2022-02-19 DIAGNOSIS — E559 Vitamin D deficiency, unspecified: Secondary | ICD-10-CM | POA: Diagnosis present

## 2022-02-19 DIAGNOSIS — N184 Chronic kidney disease, stage 4 (severe): Secondary | ICD-10-CM | POA: Diagnosis present

## 2022-02-19 DIAGNOSIS — I69359 Hemiplegia and hemiparesis following cerebral infarction affecting unspecified side: Secondary | ICD-10-CM | POA: Diagnosis not present

## 2022-02-19 DIAGNOSIS — K219 Gastro-esophageal reflux disease without esophagitis: Secondary | ICD-10-CM | POA: Diagnosis present

## 2022-02-19 DIAGNOSIS — F0390 Unspecified dementia without behavioral disturbance: Secondary | ICD-10-CM | POA: Diagnosis not present

## 2022-02-19 DIAGNOSIS — E876 Hypokalemia: Secondary | ICD-10-CM | POA: Diagnosis not present

## 2022-02-19 DIAGNOSIS — I63511 Cerebral infarction due to unspecified occlusion or stenosis of right middle cerebral artery: Secondary | ICD-10-CM | POA: Diagnosis not present

## 2022-02-19 DIAGNOSIS — K567 Ileus, unspecified: Secondary | ICD-10-CM | POA: Diagnosis not present

## 2022-02-19 DIAGNOSIS — I10 Essential (primary) hypertension: Secondary | ICD-10-CM | POA: Diagnosis not present

## 2022-02-19 DIAGNOSIS — F419 Anxiety disorder, unspecified: Secondary | ICD-10-CM | POA: Diagnosis not present

## 2022-02-19 DIAGNOSIS — N1832 Chronic kidney disease, stage 3b: Secondary | ICD-10-CM | POA: Diagnosis not present

## 2022-02-19 DIAGNOSIS — E785 Hyperlipidemia, unspecified: Secondary | ICD-10-CM | POA: Diagnosis present

## 2022-02-19 DIAGNOSIS — I129 Hypertensive chronic kidney disease with stage 1 through stage 4 chronic kidney disease, or unspecified chronic kidney disease: Secondary | ICD-10-CM | POA: Diagnosis present

## 2022-02-19 DIAGNOSIS — K819 Cholecystitis, unspecified: Secondary | ICD-10-CM | POA: Diagnosis not present

## 2022-02-19 DIAGNOSIS — Z8249 Family history of ischemic heart disease and other diseases of the circulatory system: Secondary | ICD-10-CM | POA: Diagnosis not present

## 2022-02-19 DIAGNOSIS — N400 Enlarged prostate without lower urinary tract symptoms: Secondary | ICD-10-CM | POA: Diagnosis present

## 2022-02-19 DIAGNOSIS — Z86008 Personal history of in-situ neoplasm of other site: Secondary | ICD-10-CM | POA: Diagnosis not present

## 2022-02-19 DIAGNOSIS — K8062 Calculus of gallbladder and bile duct with acute cholecystitis without obstruction: Secondary | ICD-10-CM | POA: Diagnosis present

## 2022-02-19 DIAGNOSIS — Z87891 Personal history of nicotine dependence: Secondary | ICD-10-CM

## 2022-02-19 DIAGNOSIS — I69354 Hemiplegia and hemiparesis following cerebral infarction affecting left non-dominant side: Secondary | ICD-10-CM

## 2022-02-19 DIAGNOSIS — Z8551 Personal history of malignant neoplasm of bladder: Secondary | ICD-10-CM

## 2022-02-19 DIAGNOSIS — N179 Acute kidney failure, unspecified: Secondary | ICD-10-CM | POA: Diagnosis present

## 2022-02-19 DIAGNOSIS — N401 Enlarged prostate with lower urinary tract symptoms: Secondary | ICD-10-CM | POA: Diagnosis not present

## 2022-02-19 DIAGNOSIS — K851 Biliary acute pancreatitis without necrosis or infection: Secondary | ICD-10-CM | POA: Diagnosis present

## 2022-02-19 DIAGNOSIS — A419 Sepsis, unspecified organism: Principal | ICD-10-CM | POA: Diagnosis present

## 2022-02-19 DIAGNOSIS — Z9079 Acquired absence of other genital organ(s): Secondary | ICD-10-CM | POA: Diagnosis not present

## 2022-02-19 DIAGNOSIS — K9189 Other postprocedural complications and disorders of digestive system: Secondary | ICD-10-CM | POA: Diagnosis not present

## 2022-02-19 DIAGNOSIS — K838 Other specified diseases of biliary tract: Secondary | ICD-10-CM | POA: Diagnosis not present

## 2022-02-19 DIAGNOSIS — E669 Obesity, unspecified: Secondary | ICD-10-CM | POA: Diagnosis present

## 2022-02-19 LAB — CBC WITH DIFFERENTIAL/PLATELET
Abs Immature Granulocytes: 0.03 10*3/uL (ref 0.00–0.07)
Basophils Absolute: 0 10*3/uL (ref 0.0–0.1)
Basophils Relative: 0 %
Eosinophils Absolute: 0 10*3/uL (ref 0.0–0.5)
Eosinophils Relative: 0 %
HCT: 35.2 % — ABNORMAL LOW (ref 39.0–52.0)
Hemoglobin: 11.8 g/dL — ABNORMAL LOW (ref 13.0–17.0)
Immature Granulocytes: 0 %
Lymphocytes Relative: 9 %
Lymphs Abs: 0.7 10*3/uL (ref 0.7–4.0)
MCH: 32.8 pg (ref 26.0–34.0)
MCHC: 33.5 g/dL (ref 30.0–36.0)
MCV: 97.8 fL (ref 80.0–100.0)
Monocytes Absolute: 0.8 10*3/uL (ref 0.1–1.0)
Monocytes Relative: 10 %
Neutro Abs: 6.2 10*3/uL (ref 1.7–7.7)
Neutrophils Relative %: 81 %
Platelets: 209 10*3/uL (ref 150–400)
RBC: 3.6 MIL/uL — ABNORMAL LOW (ref 4.22–5.81)
RDW: 13.4 % (ref 11.5–15.5)
WBC: 7.8 10*3/uL (ref 4.0–10.5)
nRBC: 0 % (ref 0.0–0.2)

## 2022-02-19 LAB — URINALYSIS, ROUTINE W REFLEX MICROSCOPIC
Bilirubin Urine: NEGATIVE
Glucose, UA: NEGATIVE mg/dL
Hgb urine dipstick: NEGATIVE
Ketones, ur: NEGATIVE mg/dL
Leukocytes,Ua: NEGATIVE
Nitrite: NEGATIVE
Protein, ur: 30 mg/dL — AB
Specific Gravity, Urine: 1.018 (ref 1.005–1.030)
pH: 6 (ref 5.0–8.0)

## 2022-02-19 LAB — COMPREHENSIVE METABOLIC PANEL
ALT: 265 U/L — ABNORMAL HIGH (ref 0–44)
AST: 361 U/L — ABNORMAL HIGH (ref 15–41)
Albumin: 3.4 g/dL — ABNORMAL LOW (ref 3.5–5.0)
Alkaline Phosphatase: 567 U/L — ABNORMAL HIGH (ref 38–126)
Anion gap: 6 (ref 5–15)
BUN: 31 mg/dL — ABNORMAL HIGH (ref 8–23)
CO2: 26 mmol/L (ref 22–32)
Calcium: 8.7 mg/dL — ABNORMAL LOW (ref 8.9–10.3)
Chloride: 106 mmol/L (ref 98–111)
Creatinine, Ser: 1.64 mg/dL — ABNORMAL HIGH (ref 0.61–1.24)
GFR, Estimated: 42 mL/min — ABNORMAL LOW (ref >60)
Glucose, Bld: 136 mg/dL — ABNORMAL HIGH (ref 70–99)
Potassium: 4.3 mmol/L (ref 3.5–5.1)
Sodium: 138 mmol/L (ref 135–145)
Total Bilirubin: 3.2 mg/dL — ABNORMAL HIGH (ref 0.3–1.2)
Total Protein: 6.9 g/dL (ref 6.5–8.1)

## 2022-02-19 LAB — LIPASE, BLOOD: Lipase: 447 U/L — ABNORMAL HIGH (ref 11–51)

## 2022-02-19 MED ORDER — ACETAMINOPHEN 325 MG PO TABS
650.0000 mg | ORAL_TABLET | Freq: Four times a day (QID) | ORAL | Status: DC | PRN
  Administered 2022-02-20 (×2): 650 mg via ORAL
  Filled 2022-02-19 (×2): qty 2

## 2022-02-19 MED ORDER — PIPERACILLIN-TAZOBACTAM 3.375 G IVPB
3.3750 g | Freq: Three times a day (TID) | INTRAVENOUS | Status: DC
  Administered 2022-02-20 – 2022-02-24 (×14): 3.375 g via INTRAVENOUS
  Filled 2022-02-19 (×14): qty 50

## 2022-02-19 MED ORDER — ONDANSETRON 4 MG PO TBDP: 4.0000 mg | ORAL_TABLET | Freq: Four times a day (QID) | ORAL | Status: DC | PRN

## 2022-02-19 MED ORDER — HYDRALAZINE HCL 20 MG/ML IJ SOLN: 10.0000 mg | INTRAMUSCULAR | Status: DC | PRN

## 2022-02-19 MED ORDER — MORPHINE SULFATE (PF) 2 MG/ML IV SOLN: 2.0000 mg | INTRAVENOUS | Status: DC | PRN

## 2022-02-19 MED ORDER — PIPERACILLIN-TAZOBACTAM 3.375 G IVPB 30 MIN
3.3750 g | Freq: Once | INTRAVENOUS | Status: AC
  Administered 2022-02-19: 3.375 g via INTRAVENOUS
  Filled 2022-02-19: qty 50

## 2022-02-19 MED ORDER — CARBAMAZEPINE 100 MG/5ML PO SUSP: 100.0000 mg | Freq: Three times a day (TID) | ORAL | Status: DC

## 2022-02-19 MED ORDER — ACETAMINOPHEN 650 MG RE SUPP: 650.0000 mg | Freq: Four times a day (QID) | RECTAL | Status: DC | PRN

## 2022-02-19 MED ORDER — ACETAMINOPHEN 500 MG PO TABS
1000.0000 mg | ORAL_TABLET | Freq: Once | ORAL | Status: AC
  Administered 2022-02-19: 1000 mg via ORAL
  Filled 2022-02-19: qty 2

## 2022-02-19 MED ORDER — HALOPERIDOL LACTATE 5 MG/ML IJ SOLN
5.0000 mg | Freq: Four times a day (QID) | INTRAMUSCULAR | Status: DC | PRN
Start: 2022-02-19 — End: 2022-02-25

## 2022-02-19 MED ORDER — ONDANSETRON HCL 4 MG/2ML IJ SOLN: 4.0000 mg | Freq: Four times a day (QID) | INTRAMUSCULAR | Status: DC | PRN

## 2022-02-19 MED ORDER — CARBAMAZEPINE 100 MG/5ML PO SUSP
100.0000 mg | Freq: Two times a day (BID) | ORAL | Status: DC
  Administered 2022-02-20 – 2022-02-25 (×8): 100 mg via ORAL
  Filled 2022-02-19 (×13): qty 5

## 2022-02-19 MED ORDER — TRAZODONE HCL 50 MG PO TABS
75.0000 mg | ORAL_TABLET | Freq: Once | ORAL | Status: AC
  Administered 2022-02-20: 75 mg via ORAL
  Filled 2022-02-19: qty 2

## 2022-02-19 MED ORDER — LORAZEPAM 2 MG/ML IJ SOLN
1.0000 mg | Freq: Four times a day (QID) | INTRAMUSCULAR | Status: DC | PRN
Start: 2022-02-19 — End: 2022-02-25
  Administered 2022-02-19: 1 mg via INTRAVENOUS
  Filled 2022-02-19: qty 1

## 2022-02-19 MED ORDER — LACTATED RINGERS IV SOLN: INTRAVENOUS | Status: DC

## 2022-02-19 MED ORDER — CARBAMAZEPINE 100 MG/5ML PO SUSP
200.0000 mg | Freq: Every day | ORAL | Status: DC
  Administered 2022-02-19 – 2022-02-24 (×6): 200 mg via ORAL
  Filled 2022-02-19 (×8): qty 10

## 2022-02-19 NOTE — Progress Notes (Signed)
Pharmacy Antibiotic Note  WAI LITT is a 81 y.o. male for which pharmacy has been consulted for zosyn dosing for  intra-abdominal infection .  Estimated Creatinine Clearance: 40.3 mL/min (A) (by C-G formula based on SCr of 1.64 mg/dL (H)). WBC 7.8; T afeb; HR 70; RR 16  Plan: Zosyn 3.375g IV q8h (4 hour infusion) Trend WBC, Fever, Renal function F/u cultures, clinical course, WBC, fever De-escalate when able  Height: '5\' 9"'$  (175.3 cm) Weight: 88.9 kg (196 lb) IBW/kg (Calculated) : 70.7  Temp (24hrs), Avg:98.1 F (36.7 C), Min:98 F (36.7 C), Max:98.2 F (36.8 C)  Recent Labs  Lab 02/19/22 1118  WBC 7.8  CREATININE 1.64*    Estimated Creatinine Clearance: 40.3 mL/min (A) (by C-G formula based on SCr of 1.64 mg/dL (H)).    Allergies  Allergen Reactions   Bee Venom Anaphylaxis   Strawberry Extract Hives   Latex Itching   Zetia [Ezetimibe] Other (See Comments)    Intolerance    Adhesive [Tape] Other (See Comments)    BLISTER   Statins Other (See Comments)    myalgias    Antimicrobials this admission: zosyn 9/8 >>   Microbiology results: Pending  Thank you for allowing pharmacy to be a part of this patient's care.  Lorelei Pont, PharmD, BCPS 02/19/2022 6:34 PM ED Clinical Pharmacist -  [phone]

## 2022-02-19 NOTE — Telephone Encounter (Signed)
Pt at ED.

## 2022-02-19 NOTE — H&P (Signed)
History and Physical    Patient: William [name] GNF:[id number] DOB: [birth year] DOA: 02/19/2022 DOS: the patient was seen and examined on 02/19/2022 PCP: Ann Held, DO  Patient coming from: Home - lives with wife; NOK: Wife, [phone]   Chief Complaint: Abdominal pain  HPI: William [name] is a 81 y.o. male with medical history significant of BPH; stage 3 CKD; h/o CVA; HTN; HLD; and mild vascular dementia presenting with abdominal pain.  He reports that he vomited this AM.  He had abdominal pain last night.  This AM, his emesis was dark with specks in it, wife thought bilious.  His urine was very malodorous this AM.  No fever.  BP was normal for him.  He drank a bit of OJ, water, wouldn't eat.  He pockets food and spits out since his stroke.  He has had colitis in the past, was on abx x 3-4 months ago (April 2023).  He has difficulty localizing pain since last stroke and appears not as sensitive to pain.  He does still have a gallbladder.  He has been more yellowish in the last few days.  The patient and his wife discussed his preferences.  He is wiling to proceed with ERCP but had another stroke with his last surgery and would prefer to manage his cholecystitis conservatively without perc drain or surgery if possible.    ER Course:  Abdominal pain since last night, n/v this AM.  Appears to have a CBD stone, ?acute pancreatitis.  [place] with CBD stone.  Giving empiric abx.  Dr. Benson Norway to see.     Review of Systems: As mentioned in the history of present illness. All other systems reviewed and are negative.  Limited by cognition.  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   Mohs surgery jan 2016    Bladder stone    BPH (benign prostatic hyperplasia) 08/06/2007   Chronic kidney disease 2014   Stage III   Closed fracture of fifth metacarpal bone 05/15/2015   Eczema    Fasting hyperglycemia 12/21/2006   GERD  (gastroesophageal reflux disease)    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   HTN (hypertension) 07/19/2015   Hyperlipidemia    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   Mild, related to stroke history   Nocturia    Renal insufficiency 06/25/2013   S/P carotid endarterectomy    BILATERAL ICA--  PATENT PER DUPLEX  05-19-2012   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;  Service: Urology;  Laterality: Bilateral;   CYSTOSCOPY WITH LITHOLAPAXY N/A 02/26/2013   Procedure: CYSTOSCOPY WITH LITHOLAPAXY;  Surgeon: Franchot Gallo, MD;  Location: [facility];  Service: Urology;  Laterality: N/A;   EYE SURGERY  Jan. 2016   cataract surgery both eyes  INGUINAL HERNIA REPAIR Right 11-08-2006   IR KYPHO EA ADDL LEVEL THORACIC OR LUMBAR  02/12/2021   IR RADIOLOGIST EVAL & MGMT  02/18/2021   MASS EXCISION N/A 03/03/2016   Procedure: EXCISION OF BACK  MASS;  Surgeon: Stark Klein, MD;  Location: [facility];  Service: General;  Laterality: N/A;   MOHS SURGERY Left 1/ 2016   Dr Nevada Crane-- Basal cell   PROSTATE SURGERY     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-C N/A 06/22/2021   Procedure: TRANSURETHRAL RESECTION OF BLADDER TUMOR;  Surgeon: Franchot Gallo, MD;  Location: [facility];  Service: Urology;  Laterality: N/A;   TRANSURETHRAL RESECTION OF  PROSTATE N/A 02/26/2013   Procedure: TRANSURETHRAL RESECTION OF THE PROSTATE WITH GYRUS INSTRUMENTS;  Surgeon: Franchot Gallo, MD;  Location: [facility];  Service: Urology;  Laterality: N/A;   TRANSURETHRAL RESECTION OF PROSTATE N/A 06/22/2021   Procedure: TRANSURETHRAL RESECTION OF THE PROSTATE (TURP);  Surgeon: Franchot Gallo, MD;  Location: [facility];  Service: Urology;  Laterality: N/A;   Social History:  reports that he quit smoking about 17 years ago. His smoking use included cigarettes. He has a 80.00 pack-year smoking history. He has never used smokeless tobacco. He reports that he does not currently use alcohol. He reports that he does not use drugs.  Allergies  Allergen Reactions   Bee Venom Anaphylaxis   Strawberry Extract Hives   Latex Itching   Zetia [Ezetimibe] Other (See Comments)    Intolerance    Adhesive [Tape] Other (See Comments)    BLISTER   Statins Other (See Comments)    myalgias    Family History  Problem Relation Age of Onset   Heart disease Mother        CHF   Bipolar disorder Mother    Heart disease Father        CHF    Prior to Admission medications   Medication Sig Start Date End Date Taking? Authorizing Provider  amantadine (SYMMETREL) 100 MG capsule Take 1 capsule (100 mg total) by mouth daily. 01/04/22   Ann Held, DO  amLODipine (NORVASC) 10 MG tablet Take 1 tablet (10 mg total) by mouth every morning. 02/03/22   Carollee Herter, Alferd Apa, DO  carBAMazepine (TEGRETOL) 100 MG/5ML suspension Take 5 mLs (100 mg total) by mouth 3 (three) times daily. 10/05/21   Raulkar, Clide Deutscher, MD  Cholecalciferol (VITAMIN D) 50 MCG (2000 UT) CAPS Take 2,000 Units by mouth every morning. Chewy vitamin    [provider]  clopidogrel (PLAVIX) 75 MG tablet Take 1 tablet (75 mg total) by mouth daily. 01/04/22   Ann Held, DO  clotrimazole-betamethasone (LOTRISONE) cream Apply 1 Application on to the skin  daily. 12/10/21   Roma Schanz R, DO  fluticasone (CUTIVATE) 7.16 % cream 1 application daily as needed (psoriasis). 12/16/16   [provider]  hydrALAZINE (APRESOLINE) 25 MG tablet Take 1 tablet (25 mg total) by mouth every 8 (eight) hours. 01/04/22   Ann Held, DO  hydrOXYzine (VISTARIL) 25 MG capsule Take 1 capsule by mouth everyday at 3:30 PM as needed 01/26/22   Plovsky, Berneta Sages, MD  LORazepam (ATIVAN) 0.5 MG tablet Take 1/2 tablet (0.25 mg total) by mouth daily. 01/26/22   Plovsky, Berneta Sages, MD  LORazepam ER (LOREEV XR) 1 MG CS24 Take 1 mg (1 capsule) by mouth at bedtime. 01/26/22   Norma Fredrickson, MD  Multiple Vitamins-Minerals (ADULT  ONE DAILY GUMMIES) CHEW Chew 1 tablet by mouth every morning.    [provider]  pantoprazole (PROTONIX) 40 MG tablet Take 1 tablet (40 mg total) by mouth daily. 09/15/21   Ann Held, DO  pantoprazole sodium (PROTONIX) 40 mg Place 40 mg into feeding tube daily. 09/15/21   Ann Held, DO  polyethylene glycol (MIRALAX / GLYCOLAX) 17 g packet Take 17 g by mouth daily. 10/02/21   Sherwood Gambler, MD  tamsulosin (FLOMAX) 0.4 MG CAPS capsule Take 1 capsule (0.4 mg total) by mouth daily after supper. 02/08/22   Ann Held, DO  traMADol (ULTRAM) 50 MG tablet Take 1 tablet (50 mg total) by mouth every 12 (twelve) hours as needed (back pain). 01/26/22   Ann Held, DO  traMADol (ULTRAM) 50 MG tablet Take 1 tablet by mouth every 6 hours as needed for 5 days. 02/04/22     traZODone (DESYREL) 150 MG tablet Take 1/2 tablet (75 mg total) by mouth at bedtime. 01/26/22   Plovsky, Berneta Sages, MD  triamcinolone cream (KENALOG) 0.1 % Apply 1 application topically 2 (two) times daily. 01/04/22   Ann Held, DO    Physical Exam: Vitals:   02/19/22 1630 02/19/22 1645 02/19/22 1730 02/19/22 1815  BP: 139/75 134/65 (!) 152/65 (!) 154/69  Pulse: 67 68 66 65  Resp: _0 Temp:      TempSrc:      SpO2:  100% 96% 98% 99%  Weight:      Height:       General:  Appears calm and comfortable and is in NAD, pleasant but clearly with MCI Eyes:  PERRL, EOMI, normal lids, iris ENT:  grossly normal hearing, lips & tongue, mmm; poor dentition Neck:  no LAD, masses or thyromegaly Cardiovascular:  RRR, no m/r/g. No LE edema.  Respiratory:   CTA bilaterally with no wheezes/rales/rhonchi.  Normal respiratory effort. Abdomen:  soft, mild RUQ > RLQ TTP, ND Skin:  no rash or induration seen on limited exam Musculoskeletal:  grossly normal tone BUE/BLE, good ROM, no bony abnormality Psychiatric:  eccentric mood and affect, speech slow but appropriate Neurologic:  CN 2-12 grossly intact, limited by cognition   Radiological Exams on Admission: Independently reviewed - see discussion in A/P where applicable  US Abdomen Limited RUQ (LIVER/GB)  Result Date: 02/19/2022 CLINICAL DATA:  Pancreatitis in a 81 year old male. Assess for ductal dilation. EXAM: ULTRASOUND ABDOMEN LIMITED RIGHT UPPER QUADRANT COMPARISON:  April 2023 FINDINGS: Gallbladder: The gallbladder is filled with gallstones. Wall thickness is difficult to evaluate given the presence of numerous gallstones. The anterior wall does not appear grossly thickened. The posterior wall cannot be seen due to shadowing. No pericholecystic fluid. Calculi as large as 2 cm or greater, individual stones are difficult to evaluate. No reported tenderness over the gallbladder. Common bile duct: Diameter: 10 mm Liver: Lobular hepatic contours. Signs of fissural widening. Echogenic liver. Portal vein is patent on color Doppler imaging with normal direction of blood flow towards the liver. Other: Renal cysts of the RIGHT kidney are better characterized on previous imaging. These are not fully evaluated on the current exam. Based on previous imaging no dedicated imaging follow-up of this area is recommended. IMPRESSION: Dilation of the common bile duct at 1 cm in the setting of  acute pancreatitis based on clinical history. MRCP may be helpful for further evaluation to exclude choledocholithiasis in this patient with extensive cholelithiasis. Gallbladder not well  assessed due to extensive cholelithiasis. No reported tenderness over the gallbladder at this time. Electronically Signed   By: Zetta Bills M.D.   On: 02/19/2022 15:12    EKG: not done   Labs on Admission: I have personally reviewed the available labs and imaging studies at the time of the admission.  Pertinent labs:    Glucose 136 BUN 31/Creatinine 1.64/GFR 42 - stable AP 567; 584 on 6/15 AST 447/ALT 361/Bili 3.2 - normal on 7/14 WBC 7.8 Hgb 11.8 - stable UA 30 protein   Assessment and Plan: Principal Problem:   Choledocholithiasis Active Problems:   BPH (benign prostatic hyperplasia)   History of right MCA infarct   Major vascular neurocognitive disorder   HTN (hypertension)   Chronic kidney disease (CKD), stage IV (severe) (HCC)   DNR (do not resuscitate)    Choledocholithiasis -Patient without prior diagnosis of cholecystitis presenting with acute onset of RUQ pain and n/v -LFTs are significantly elevated, as is Alk Phos (this was also elevated in June without apparent f/u) -Imaging with CBD dilation with extensive cholelithiasis -His symptoms are less concerning for pancreatitis at this time, although there may be a component -Clear liquids, NPO after midnight -Morphine for pain, Zofran for nausea -Dr. Benson Norway to see - either ERCP or MRCP is likely -Empiric coverage with Zosyn for now  -For now, patient and wife report that he does not desire to undergo cholecystectomy or perc cholecystostomy; if he has recurrent issues in the future they may reconsider -His wife declines surgical consult at this time  Vascular dementia with h/o CVA -His wife anticipates delirium in the hospital and reports he is a fall risk -Fall precautions, delirium precautions ordered -Hold amantadine for now  (pill form, and he takes pills crushed in orange sherbet) -Continue Tegretol (liquid) -Change lorazepam to IV prn -Hold Plavix due to possible need for a procedure  HTN -Change PO hydralazine to IV prn  BPH -Hold Flomax (pill) for now  Stage 3b CKD -Appears to be stable at this time -Attempt to avoid nephrotoxic medications -Recheck BMP in AM   DNR -I have discussed code status with the patient and hhis wife and  they are in agreement that the patient would not desire resuscitation and would prefer to die a natural death should that situation arise. -He will need a gold out of facility DNR form at the time of discharge    Advance Care Planning:   Code Status: DNR   Consults: GI  DVT Prophylaxis: SCDs  Family Communication: Wife was present throughout evaluation  Severity of Illness: The appropriate patient status for this patient is INPATIENT. Inpatient status is judged to be reasonable and necessary in order to provide the required intensity of service to ensure the patient's safety. The patient's presenting symptoms, physical exam findings, and initial radiographic and laboratory data in the context of their chronic comorbidities is felt to place them at high risk for further clinical deterioration. Furthermore, it is not anticipated that the patient will be medically stable for discharge from the hospital within 2 midnights of admission.   * I certify that at the point of admission it is my clinical judgment that the patient will require inpatient hospital care spanning beyond 2 midnights from the point of admission due to high intensity of service, high risk for further deterioration and high frequency of surveillance required.*  Author: Karmen Bongo, MD 02/19/2022 6:26 PM  For on call review [email or web address].

## 2022-02-19 NOTE — ED Provider Triage Note (Signed)
Emergency Medicine Provider Triage Evaluation Note  William [name] , a 81 y.o. male  was evaluated in triage.  Pt complains of foul-smelling urine and lower abdominal pain since last night, with 1 episode of vomiting this morning.  Vomiting described as dark brown, did not eat breakfast or anything beforehand.  Denies fever, flank pain, testicular pain.  No constipation or diarrhea.  Review of Systems  Positive:  Negative: See above  Physical Exam  BP 137/70 (BP Location: Right Arm)   Pulse 72   Temp 98.2 F (36.8 C) (Oral)   Resp 17   Ht '5\' 9"'$  (1.753 m)   Wt 88.9 kg   SpO2 99%   BMI 28.94 kg/m  Gen:   Awake, no distress   Resp:  Normal effort  MSK:   Moves extremities without difficulty  Other:  Suprapubic abdominal tenderness, soft.  Medical Decision Making  Medically screening exam initiated at 11:33 AM.  Appropriate orders placed.  William [name] was informed that the remainder of the evaluation will be completed by another provider, this initial triage assessment does not replace that evaluation, and the importance of remaining in the ED until their evaluation is complete.     William Rome, PA-C 16/10/96 1135

## 2022-02-19 NOTE — ED Notes (Addendum)
Pt's wife gave him home medication - hydralazine '25mg'$  and Tegretol '100mg'$ 

## 2022-02-19 NOTE — Telephone Encounter (Signed)
Nurse Assessment Nurse: Patsey Berthold, RN, Roma Kayser Date/Time Eilene Ghazi Time): 02/19/2022 9:31:06 AM Confirm and document reason for call. If symptomatic, describe symptoms. ---Caller states she is calling on behalf of her husband. Caller states her husband vomited and the vomit had black stuff in it. Caller states he has been able to urinate within the last 8 hrs. Caller states she no longer wants to speak with RN she just wants an appt. with the office. Does the patient have any new or worsening symptoms? ---Yes Will a triage be completed? ---Yes Related visit to physician within the last 2 weeks? ---No Does the PT have any chronic conditions? (i.e. diabetes, asthma, this includes High risk factors for pregnancy, etc.) ---Yes List chronic conditions. ---stroke, Renal failure Is this a behavioral health or substance abuse call? ---No Guidelines Guideline Title Affirmed Question Affirmed Notes Nurse Date/Time (Eastern Time) Vomiting [1] Vomiting AND [2] contains red blood or black ("coffee Vivi Ferns 02/19/2022 9:33:56 AM PLEASE NOTE: All timestamps contained within this report are represented as [place] Standard Time. CONFIDENTIALTY NOTICE: This fax transmission is intended only for the addressee. It contains information that is legally privileged, confidential or otherwise protected from use or disclosure. If you are not the intended recipient, you are strictly prohibited from reviewing, disclosing, copying using or disseminating any of this information or taking any action in reliance on or regarding this information. If you have received this fax in error, please notify us immediately by telephone so that we can arrange for its return to [place]. Phone: [phone], Toll-Free: [phone], Fax: [phone] Page: 2 of 2 Call Id: [id number] Guidelines Guideline Title Affirmed Question Affirmed Notes Nurse Date/Time Eilene Ghazi Time) ground") material (Exception: Few red streaks in  vomit that only happened once.) Disp. Time Eilene Ghazi Time) Disposition Final User 02/19/2022 9:41:50 AM Go to ED Now Yes Patsey Berthold, RN, Roma Kayser Final Disposition 02/19/2022 9:41:50 AM Go to ED Now Yes Patsey Berthold, RN, Melvyn Novas Disagree/Comply Comply Caller Understands Yes PreDisposition Call Doctor Care Advice Given Per Guideline GO TO ED NOW: CARE ADVICE per Vomiting (Adult) guideline. * You need to be seen in the Emergency Department. Comments User: Diana Eves, RN Date/Time Eilene Ghazi Time): 02/19/2022 9:32:50 AM Caller states that husband is fine now, this AM was brushing teeth and was vomiting. Emesis was almost a black color, then stopped. He drank some fluids and ate toast- after stroke he started pocketing food, but ate some. He was given Protonix, no fever, BP normal at 118/78 this AM. User: Diana Eves, RN Date/Time (Eastern Time): 02/19/2022 9:33:19 AM Caller states that stomach still upset, still feels nauseated. User: Diana Eves, RN Date/Time Eilene Ghazi Time): 02/19/2022 9:35:47 AM Caller states she saw black coloration of emesis was about 3 cups of emesis. Happened approx at 0730. User: Diana Eves, RN Date/Time Eilene Ghazi Time): 02/19/2022 9:37:53 AM Caller is unsure of what hospital will do for her. She asks for appointment. Referrals REFERRED TO PCP OFFICE [facility] - ED [facility] - ED

## 2022-02-19 NOTE — ED Triage Notes (Signed)
Pt./wife stated, he has had an upset this morning and last night with N/V  Also he has a foul smell to his urine.

## 2022-02-19 NOTE — ED Provider Notes (Signed)
[facility] EMERGENCY DEPARTMENT Provider Note   CSN: [id number] Arrival date & time: 02/19/22  1030     History  Chief Complaint  Patient presents with   Abdominal Pain   Emesis   Nausea    William [name] is a 81 y.o. male.  81 yo M with a chief complaints of abdominal discomfort.  This was noted this morning.  He had multiple episodes of nausea and vomiting.  Since then the patient has felt a little bit better but has not been able to eat or drink anything.  No fevers were noted.  Family brought him in for evaluation.  He is been a little bit more confused than is typical for him.  Has a history of multiple strokes and so he is not normally completely oriented.   Abdominal Pain Associated symptoms: vomiting   Emesis Associated symptoms: abdominal pain        Home Medications Prior to Admission medications   Medication Sig Start Date End Date Taking? Authorizing Provider  amantadine (SYMMETREL) 100 MG capsule Take 1 capsule (100 mg total) by mouth daily. 01/04/22  Yes Roma Schanz R, DO  amLODipine (NORVASC) 10 MG tablet Take 1 tablet (10 mg total) by mouth every morning. 02/03/22  Yes Carollee Herter, Alferd Apa, DO  carBAMazepine (TEGRETOL) 100 MG/5ML suspension Take 5 mLs (100 mg total) by mouth 3 (three) times daily. Patient taking differently: Take 100-200 mg by mouth 3 (three) times daily. Take 5 ml (100 mg) BID & Take 10 ml (200 mg) in the evening 10/05/21  Yes Raulkar, Clide Deutscher, MD  Cholecalciferol (VITAMIN D3) 25 MCG (1000 UT) CHEW Chew 1 tablet by mouth daily.   Yes [provider]  clopidogrel (PLAVIX) 75 MG tablet Take 1 tablet (75 mg total) by mouth daily. 01/04/22  Yes Ann Held, DO  clotrimazole-betamethasone (LOTRISONE) cream Apply 1 Application on to the skin daily. Patient taking differently: Apply 1 Application topically daily as needed (itching). 12/10/21  Yes Roma Schanz R, DO  ferrous sulfate 325 (65 FE) MG EC  tablet Take 325 mg by mouth daily with breakfast.   Yes [provider]  fluticasone (CUTIVATE) 2.75 % cream 1 application daily as needed (psoriasis). 12/16/16  Yes [provider]  hydrALAZINE (APRESOLINE) 25 MG tablet Take 1 tablet (25 mg total) by mouth every 8 (eight) hours. 01/04/22  Yes Roma Schanz R, DO  LORazepam (ATIVAN) 0.5 MG tablet Take 1/2 tablet (0.25 mg total) by mouth daily. 01/26/22  Yes Plovsky, Berneta Sages, MD  LORazepam ER (LOREEV XR) 1 MG CS24 Take 1 mg (1 capsule) by mouth at bedtime. 01/26/22  Yes Plovsky, Berneta Sages, MD  Multiple Vitamins-Minerals (ADULT ONE DAILY GUMMIES) CHEW Chew 1 tablet by mouth every morning.   Yes [provider]  pantoprazole (PROTONIX) 40 MG tablet Take 1 tablet (40 mg total) by mouth daily. 09/15/21  Yes Roma Schanz R, DO  polyethylene glycol (MIRALAX / GLYCOLAX) 17 g packet Take 17 g by mouth daily. 10/02/21  Yes Sherwood Gambler, MD  tamsulosin (FLOMAX) 0.4 MG CAPS capsule Take 1 capsule (0.4 mg total) by mouth daily after supper. 02/08/22  Yes Roma Schanz R, DO  traZODone (DESYREL) 150 MG tablet Take 1/2 tablet (75 mg total) by mouth at bedtime. 01/26/22  Yes Plovsky, Berneta Sages, MD  triamcinolone cream (KENALOG) 0.1 % Apply 1 application topically 2 (two) times daily. Patient taking differently: Apply 1 Application topically 2 (two)  times daily as needed (rash). 01/04/22  Yes Ann Held, DO  hydrOXYzine (VISTARIL) 25 MG capsule Take 1 capsule by mouth everyday at 3:30 PM as needed Patient taking differently: Take 25 mg by mouth daily as needed for anxiety. Take 1 capsule by mouth everyday PRN at 3:30 PM 01/26/22   Plovsky, Berneta Sages, MD  traMADol (ULTRAM) 50 MG tablet Take 1 tablet by mouth every 6 hours as needed for 5 days. Patient taking differently: Take 50 mg by mouth every 6 (six) hours as needed for moderate pain. 02/04/22         Allergies    Bee venom, Strawberry extract, Latex, Zetia [ezetimibe],  Adhesive [tape], and Statins    Review of Systems   Review of Systems  Gastrointestinal:  Positive for abdominal pain and vomiting.    Physical Exam Updated Vital Signs BP (!) 177/67 (BP Location: Left Arm)   Pulse 69   Temp 98.1 F (36.7 C) (Oral)   Resp 18   Ht '5\' 9"'$  (1.753 m)   Wt 88.9 kg   SpO2 99%   BMI 28.94 kg/m  Physical Exam Vitals and nursing note reviewed.  Constitutional:      Appearance: He is well-developed.  HENT:     Head: Normocephalic and atraumatic.  Eyes:     Pupils: Pupils are equal, round, and reactive to light.  Neck:     Vascular: No JVD.  Cardiovascular:     Rate and Rhythm: Normal rate and regular rhythm.     Heart sounds: No murmur heard.    No friction rub. No gallop.  Pulmonary:     Effort: No respiratory distress.     Breath sounds: No wheezing.  Abdominal:     General: There is no distension.     Tenderness: There is abdominal tenderness in the right upper quadrant and epigastric area. There is guarding. There is no rebound.     Comments: Patient has guarding in the right upper quadrant and epigastrium but denies any pain  Musculoskeletal:        General: Normal range of motion.     Cervical back: Normal range of motion and neck supple.  Skin:    Coloration: Skin is not pale.     Findings: No rash.  Neurological:     Mental Status: He is alert and oriented to person, place, and time.  Psychiatric:        Behavior: Behavior normal.     ED Results / Procedures / Treatments   Labs (all labs ordered are listed, but only abnormal results are displayed) Labs Reviewed  LIPASE, BLOOD - Abnormal; Notable for the following components:      Result Value   Lipase 447 (*)    All other components within normal limits  COMPREHENSIVE METABOLIC PANEL - Abnormal; Notable for the following components:   Glucose, Bld 136 (*)    BUN 31 (*)    Creatinine, Ser 1.64 (*)    Calcium 8.7 (*)    Albumin 3.4 (*)    AST 361 (*)    ALT 265 (*)     Alkaline Phosphatase 567 (*)    Total Bilirubin 3.2 (*)    GFR, Estimated 42 (*)    All other components within normal limits  URINALYSIS, ROUTINE W REFLEX MICROSCOPIC - Abnormal; Notable for the following components:   Color, Urine AMBER (*)    Protein, ur 30 (*)    Bacteria, UA RARE (*)  All other components within normal limits  CBC WITH DIFFERENTIAL/PLATELET - Abnormal; Notable for the following components:   RBC 3.60 (*)    Hemoglobin 11.8 (*)    HCT 35.2 (*)    All other components within normal limits  COMPREHENSIVE METABOLIC PANEL  CBC    EKG None  Radiology US Abdomen Limited RUQ (LIVER/GB)  Result Date: 02/19/2022 CLINICAL DATA:  Pancreatitis in a 81 year old male. Assess for ductal dilation. EXAM: ULTRASOUND ABDOMEN LIMITED RIGHT UPPER QUADRANT COMPARISON:  April 2023 FINDINGS: Gallbladder: The gallbladder is filled with gallstones. Wall thickness is difficult to evaluate given the presence of numerous gallstones. The anterior wall does not appear grossly thickened. The posterior wall cannot be seen due to shadowing. No pericholecystic fluid. Calculi as large as 2 cm or greater, individual stones are difficult to evaluate. No reported tenderness over the gallbladder. Common bile duct: Diameter: 10 mm Liver: Lobular hepatic contours. Signs of fissural widening. Echogenic liver. Portal vein is patent on color Doppler imaging with normal direction of blood flow towards the liver. Other: Renal cysts of the RIGHT kidney are better characterized on previous imaging. These are not fully evaluated on the current exam. Based on previous imaging no dedicated imaging follow-up of this area is recommended. IMPRESSION: Dilation of the common bile duct at 1 cm in the setting of acute pancreatitis based on clinical history. MRCP may be helpful for further evaluation to exclude choledocholithiasis in this patient with extensive cholelithiasis. Gallbladder not well assessed due to extensive  cholelithiasis. No reported tenderness over the gallbladder at this time. Electronically Signed   By: Zetta Bills M.D.   On: 02/19/2022 15:12    Procedures Procedures    Medications Ordered in ED Medications  LORazepam (ATIVAN) injection 1 mg (1 mg Intravenous Given 02/19/22 2254)  haloperidol lactate (HALDOL) injection 5 mg (has no administration in time range)  lactated ringers infusion ( Intravenous New Bag/Given 02/19/22 2221)  acetaminophen (TYLENOL) tablet 650 mg (has no administration in time range)    Or  acetaminophen (TYLENOL) suppository 650 mg (has no administration in time range)  morphine (PF) 2 MG/ML injection 2 mg (has no administration in time range)  ondansetron (ZOFRAN-ODT) disintegrating tablet 4 mg (has no administration in time range)    Or  ondansetron (ZOFRAN) injection 4 mg (has no administration in time range)  hydrALAZINE (APRESOLINE) injection 10 mg (has no administration in time range)  carBAMazepine (TEGRETOL) 100 MG/5ML suspension 100 mg (has no administration in time range)  carBAMazepine (TEGRETOL) 100 MG/5ML suspension 200 mg (200 mg Oral Given 02/19/22 2215)  piperacillin-tazobactam (ZOSYN) IVPB 3.375 g (has no administration in time range)  traZODone (DESYREL) tablet 75 mg (has no administration in time range)  piperacillin-tazobactam (ZOSYN) IVPB 3.375 g (0 g Intravenous Stopped 02/19/22 1831)  acetaminophen (TYLENOL) tablet 1,000 mg (1,000 mg Oral Given 02/19/22 1734)    ED Course/ Medical Decision Making/ A&P                           Medical Decision Making Amount and/or Complexity of Data Reviewed Labs: ordered.  Risk OTC drugs. Prescription drug management. Decision regarding hospitalization.   81 yo M with a chief complaints of abdominal pain.  Reportedly started this morning.  Wife said that maybe it started last night.  He had a couple episodes of nausea and vomiting this morning.  Since that has not been able to eat or drink.  He denies  having any pain but seems to grimace when I palpate his epigastrium and right upper quadrant.  His LFTs are all elevated, lipase of 447.  Right upper quadrant ultrasound concerning for common bile duct dilatation.  We will give a dose of antibiotics.  Will discuss with GI.  Discussed with Dr. Collene Mares, Dr Benson [place] on call this weekend, will come and see the patient in the hospital.   Discussed with medicine for admission.   CRITICAL CARE Performed by: Cecilio Asper   Total critical care time: 35 minutes  Critical care time was exclusive of separately billable procedures and treating other patients.  Critical care was necessary to treat or prevent imminent or life-threatening deterioration.  Critical care was time spent personally by me on the following activities: development of treatment plan with patient and/or surrogate as well as nursing, discussions with consultants, evaluation of patient's response to treatment, examination of patient, obtaining history from patient or surrogate, ordering and performing treatments and interventions, ordering and review of laboratory studies, ordering and review of radiographic studies, pulse oximetry and re-evaluation of patient's condition.   The patients results and plan were reviewed and discussed.   Any x-rays performed were independently reviewed by myself.   Differential diagnosis were considered with the presenting HPI.  Medications  LORazepam (ATIVAN) injection 1 mg (1 mg Intravenous Given 02/19/22 2254)  haloperidol lactate (HALDOL) injection 5 mg (has no administration in time range)  lactated ringers infusion ( Intravenous New Bag/Given 02/19/22 2221)  acetaminophen (TYLENOL) tablet 650 mg (has no administration in time range)    Or  acetaminophen (TYLENOL) suppository 650 mg (has no administration in time range)  morphine (PF) 2 MG/ML injection 2 mg (has no administration in time range)  ondansetron (ZOFRAN-ODT) disintegrating tablet 4  mg (has no administration in time range)    Or  ondansetron (ZOFRAN) injection 4 mg (has no administration in time range)  hydrALAZINE (APRESOLINE) injection 10 mg (has no administration in time range)  carBAMazepine (TEGRETOL) 100 MG/5ML suspension 100 mg (has no administration in time range)  carBAMazepine (TEGRETOL) 100 MG/5ML suspension 200 mg (200 mg Oral Given 02/19/22 2215)  piperacillin-tazobactam (ZOSYN) IVPB 3.375 g (has no administration in time range)  traZODone (DESYREL) tablet 75 mg (has no administration in time range)  piperacillin-tazobactam (ZOSYN) IVPB 3.375 g (0 g Intravenous Stopped 02/19/22 1831)  acetaminophen (TYLENOL) tablet 1,000 mg (1,000 mg Oral Given 02/19/22 1734)    Vitals:   02/19/22 1900 02/19/22 2007 02/19/22 2009 02/19/22 2108  BP: (!) 114/101 (!) 162/63  (!) 177/67  Pulse: 67 69  69  Resp: '20 18  18  '$ Temp:   98.1 F (36.7 C) 98.1 F (36.7 C)  TempSrc:   Oral Oral  SpO2: 100% 100%  99%  Weight:      Height:        Final diagnoses:  Choledocholithiasis    Admission/ observation were discussed with the admitting physician, patient and/or family and they are comfortable with the plan.         Final Clinical Impression(s) / ED Diagnoses Final diagnoses:  Choledocholithiasis    Rx / DC Orders ED Discharge Orders     None         Deno Etienne, DO 02/19/22 2300

## 2022-02-19 NOTE — Consult Note (Addendum)
Reason for Consult:Gallstone pancreatitis Referring Physician: [facility]  William [name] HPI: This is a 81 year old make with a PMH of abdominal pain.  Last evening, after eating [place] food the patient developed RUQ pain.  This AM he had significant vomiting and his wife reports bilious vomiting.  He had this problem in the past, but this time it was the most severe.  Work up in the ER showed that his liver panel was elevated:  AST 361, ALT 265, AP 567, TB 3.2.  His lipase was elevated at 447.  The RUQ U/S shows cholelithiasis and a CBD at 10 mm.  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  Currently he is taking Plavix and his last dose was yesterday.  The patient is afebrile and his WBC is not elevated.  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   Mild, related to stroke history   Nocturia    Renal insufficiency 06/25/2013   S/P carotid endarterectomy    BILATERAL ICA--  PATENT PER DUPLEX  05-19-2012   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   Right calf   Urinary frequency    Vitamin D deficiency     Past Surgical History:  Procedure Laterality Date   APPENDECTOMY  AS CHILD   CARDIOVASCULAR STRESS TEST  03-27-2012  DR CRENSHAW   LOW RISK LEXISCAN STUDY-- PROBABLE NORMAL PERFUSION AND SOFT TISSUE  ATTENUATION/  NO ISCHEMIA/ EF 51%   CAROTID ENDARTERECTOMY Bilateral LEFT  11-12-2008  DR GREG HAYES   RIGHT ICA  2006  ([facility])   CYSTOSCOPY W/ RETROGRADES Bilateral 06/22/2021   Procedure: CYSTOSCOPY WITH RETROGRADE PYELOGRAM;  Surgeon: Franchot Gallo, MD;  Location: [facility];  Service: Urology;  Laterality: Bilateral;   CYSTOSCOPY WITH LITHOLAPAXY N/A 02/26/2013   Procedure: CYSTOSCOPY WITH LITHOLAPAXY;  Surgeon: Franchot Gallo, MD;  Location: [facility];  Service: Urology;  Laterality: N/A;   EYE SURGERY  Jan. 2016   cataract surgery both eyes   INGUINAL HERNIA REPAIR Right 11-08-2006   IR KYPHO EA ADDL LEVEL THORACIC OR LUMBAR  02/12/2021   IR RADIOLOGIST EVAL & MGMT  02/18/2021   MASS EXCISION N/A 03/03/2016   Procedure: EXCISION OF BACK  MASS;  Surgeon: Stark Klein, MD;  Location: [facility];  Service: General;  Laterality: N/A;   MOHS SURGERY Left 1/ 2016   Dr Nevada Crane-- Basal cell   PROSTATE SURGERY     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-C N/A 06/22/2021   Procedure: TRANSURETHRAL RESECTION OF BLADDER TUMOR;  Surgeon: Franchot Gallo, MD;  Location: [facility];  Service: Urology;  Laterality: N/A;   TRANSURETHRAL RESECTION OF PROSTATE N/A 02/26/2013  Procedure: TRANSURETHRAL RESECTION OF THE PROSTATE WITH GYRUS INSTRUMENTS;  Surgeon: Franchot Gallo, MD;  Location: [facility];  Service: Urology;  Laterality: N/A;   TRANSURETHRAL RESECTION OF PROSTATE N/A 06/22/2021   Procedure: TRANSURETHRAL RESECTION OF THE PROSTATE (TURP);  Surgeon: Franchot Gallo, MD;  Location: [facility];  Service: Urology;  Laterality: N/A;    Family History  Problem Relation Age of Onset   Heart disease Mother        CHF   Bipolar disorder Mother    Heart disease Father        CHF    Social History:  reports that he quit smoking about 17 years ago. His smoking use included  cigarettes. He has a 80.00 pack-year smoking history. He has never used smokeless tobacco. He reports current alcohol use. He reports that he does not use drugs.  Allergies:  Allergies  Allergen Reactions   Bee Venom Anaphylaxis   Strawberry Extract Hives   Latex Itching   Zetia [Ezetimibe] Other (See Comments)    Intolerance    Adhesive [Tape] Other (See Comments)    BLISTER   Statins Other (See Comments)    myalgias    Medications: Scheduled: Continuous:  piperacillin-tazobactam 3.375 g (02/19/22 1733)    Results for orders placed or performed during the hospital encounter of 02/19/22 (from the past 24 hour(s))  Urinalysis, Routine w reflex microscopic Urine, Clean Catch     Status: Abnormal   Collection Time: 02/19/22 11:14 AM  Result Value Ref Range   Color, Urine AMBER (A) YELLOW   APPearance CLEAR CLEAR   Specific Gravity, Urine 1.018 1.005 - 1.030   pH 6.0 5.0 - 8.0   Glucose, UA NEGATIVE NEGATIVE mg/dL   Hgb urine dipstick NEGATIVE NEGATIVE   Bilirubin Urine NEGATIVE NEGATIVE   Ketones, ur NEGATIVE NEGATIVE mg/dL   Protein, ur 30 (A) NEGATIVE mg/dL   Nitrite NEGATIVE NEGATIVE   Leukocytes,Ua NEGATIVE NEGATIVE   RBC / HPF 0-5 0 - 5 RBC/hpf   WBC, UA 11-20 0 - 5 WBC/hpf   Bacteria, UA RARE (A) NONE SEEN   Squamous Epithelial / LPF 6-10 0 - 5   Mucus PRESENT    Hyaline Casts, UA PRESENT   Lipase, blood     Status: Abnormal   Collection Time: 02/19/22 11:18 AM  Result Value Ref Range   Lipase 447 (H) 11 - 51 U/L  Comprehensive metabolic panel     Status: Abnormal   Collection Time: 02/19/22 11:18 AM  Result Value Ref Range   Sodium 138 135 - 145 mmol/L   Potassium 4.3 3.5 - 5.1 mmol/L   Chloride 106 98 - 111 mmol/L   CO2 26 22 - 32 mmol/L   Glucose, Bld 136 (H) 70 - 99 mg/dL   BUN 31 (H) 8 - 23 mg/dL   Creatinine, Ser 1.64 (H) 0.61 - 1.24 mg/dL   Calcium 8.7 (L) 8.9 - 10.3 mg/dL   Total Protein 6.9 6.5 - 8.1 g/dL   Albumin 3.4 (L) 3.5 - 5.0 g/dL   AST  361 (H) 15 - 41 U/L   ALT 265 (H) 0 - 44 U/L   Alkaline Phosphatase 567 (H) 38 - 126 U/L   Total Bilirubin 3.2 (H) 0.3 - 1.2 mg/dL   GFR, Estimated 42 (L) >60 mL/min   Anion gap 6 5 - 15  CBC with Differential     Status: Abnormal   Collection Time: 02/19/22 11:18 AM  Result Value  Ref Range   WBC 7.8 4.0 - 10.5 K/uL   RBC 3.60 (L) 4.22 - 5.81 MIL/uL   Hemoglobin 11.8 (L) 13.0 - 17.0 g/dL   HCT 35.2 (L) 39.0 - 52.0 %   MCV 97.8 80.0 - 100.0 fL   MCH 32.8 26.0 - 34.0 pg   MCHC 33.5 30.0 - 36.0 g/dL   RDW 13.4 11.5 - 15.5 %   Platelets 209 150 - 400 K/uL   nRBC 0.0 0.0 - 0.2 %   Neutrophils Relative % 81 %   Neutro Abs 6.2 1.7 - 7.7 K/uL   Lymphocytes Relative 9 %   Lymphs Abs 0.7 0.7 - 4.0 K/uL   Monocytes Relative 10 %   Monocytes Absolute 0.8 0.1 - 1.0 K/uL   Eosinophils Relative 0 %   Eosinophils Absolute 0.0 0.0 - 0.5 K/uL   Basophils Relative 0 %   Basophils Absolute 0.0 0.0 - 0.1 K/uL   Immature Granulocytes 0 %   Abs Immature Granulocytes 0.03 0.00 - 0.07 K/uL   *Note: Due to a large number of results and/or encounters for the requested time period, some results have not been displayed. A complete set of results can be found in Results Review.     US Abdomen Limited RUQ (LIVER/GB)  Result Date: 02/19/2022 CLINICAL DATA:  Pancreatitis in a 81 year old male. Assess for ductal dilation. EXAM: ULTRASOUND ABDOMEN LIMITED RIGHT UPPER QUADRANT COMPARISON:  April 2023 FINDINGS: Gallbladder: The gallbladder is filled with gallstones. Wall thickness is difficult to evaluate given the presence of numerous gallstones. The anterior wall does not appear grossly thickened. The posterior wall cannot be seen due to shadowing. No pericholecystic fluid. Calculi as large as 2 cm or greater, individual stones are difficult to evaluate. No reported tenderness over the gallbladder. Common bile duct: Diameter: 10 mm Liver: Lobular hepatic contours. Signs of fissural widening. Echogenic liver. Portal  vein is patent on color Doppler imaging with normal direction of blood flow towards the liver. Other: Renal cysts of the RIGHT kidney are better characterized on previous imaging. These are not fully evaluated on the current exam. Based on previous imaging no dedicated imaging follow-up of this area is recommended. IMPRESSION: Dilation of the common bile duct at 1 cm in the setting of acute pancreatitis based on clinical history. MRCP may be helpful for further evaluation to exclude choledocholithiasis in this patient with extensive cholelithiasis. Gallbladder not well assessed due to extensive cholelithiasis. No reported tenderness over the gallbladder at this time. Electronically Signed   By: Zetta Bills M.D.   On: 02/19/2022 15:12    ROS:  As stated above in the HPI otherwise negative.  Blood pressure 139/75, pulse 67, temperature 98 F (36.7 C), temperature source Oral, resp. rate 18, height '5\' 9"'$  (1.753 m), weight 88.9 kg, SpO2 100 %.    PE: Gen: NAD, Alert and Oriented HEENT:  [place]/AT, EOMI Neck: Supple, no LAD Lungs: CTA Bilaterally CV: RRR without M/G/R ABD: Soft, NTND, +BS Ext: No C/C/E  Assessment/Plan: 1) Probable choledocholithiasis. 2) Cholelithiasis. 3) Abdominal pain. 4) History of CVA on Plavix.   The patient is stable.  He does not have cholangitis and he is clinically stable.  The case was discussed with Dr. Therisa Doyne, ERCP coverage this weekend, and she wants to wait the full 5 days off of Plavix before performing an ERCP.  An MRCP will be ordered to determine if he has a retained stone.  Plan: 1) MRCP. 2) Follow liver enzymes. 3) Continue  off of Plavix. 4) Surgical consultation. 5) [facility] will follow the patient this weekend.  Lawana Hartzell D 02/19/2022, 4:38 PM

## 2022-02-20 ENCOUNTER — Inpatient Hospital Stay (HOSPITAL_COMMUNITY): Payer: PPO

## 2022-02-20 ENCOUNTER — Encounter (HOSPITAL_COMMUNITY): Payer: Self-pay | Admitting: Internal Medicine

## 2022-02-20 DIAGNOSIS — F0390 Unspecified dementia without behavioral disturbance: Secondary | ICD-10-CM

## 2022-02-20 DIAGNOSIS — K8050 Calculus of bile duct without cholangitis or cholecystitis without obstruction: Secondary | ICD-10-CM | POA: Diagnosis not present

## 2022-02-20 LAB — CBC
HCT: 29.4 % — ABNORMAL LOW (ref 39.0–52.0)
Hemoglobin: 9.9 g/dL — ABNORMAL LOW (ref 13.0–17.0)
MCH: 33 pg (ref 26.0–34.0)
MCHC: 33.7 g/dL (ref 30.0–36.0)
MCV: 98 fL (ref 80.0–100.0)
Platelets: 161 10*3/uL (ref 150–400)
RBC: 3 MIL/uL — ABNORMAL LOW (ref 4.22–5.81)
RDW: 13.5 % (ref 11.5–15.5)
WBC: 5.4 10*3/uL (ref 4.0–10.5)
nRBC: 0 % (ref 0.0–0.2)

## 2022-02-20 LAB — COMPREHENSIVE METABOLIC PANEL
ALT: 187 U/L — ABNORMAL HIGH (ref 0–44)
AST: 125 U/L — ABNORMAL HIGH (ref 15–41)
Albumin: 2.8 g/dL — ABNORMAL LOW (ref 3.5–5.0)
Alkaline Phosphatase: 417 U/L — ABNORMAL HIGH (ref 38–126)
Anion gap: 8 (ref 5–15)
BUN: 26 mg/dL — ABNORMAL HIGH (ref 8–23)
CO2: 23 mmol/L (ref 22–32)
Calcium: 8.7 mg/dL — ABNORMAL LOW (ref 8.9–10.3)
Chloride: 108 mmol/L (ref 98–111)
Creatinine, Ser: 1.64 mg/dL — ABNORMAL HIGH (ref 0.61–1.24)
GFR, Estimated: 42 mL/min — ABNORMAL LOW (ref >60)
Glucose, Bld: 103 mg/dL — ABNORMAL HIGH (ref 70–99)
Potassium: 3.9 mmol/L (ref 3.5–5.1)
Sodium: 139 mmol/L (ref 135–145)
Total Bilirubin: 0.8 mg/dL (ref 0.3–1.2)
Total Protein: 5.8 g/dL — ABNORMAL LOW (ref 6.5–8.1)

## 2022-02-20 LAB — TYPE AND SCREEN
ABO/RH(D): A POS
Antibody Screen: NEGATIVE

## 2022-02-20 LAB — PROTIME-INR
INR: 1.1 (ref 0.8–1.2)
Prothrombin Time: 14.4 seconds (ref 11.4–15.2)

## 2022-02-20 LAB — C-REACTIVE PROTEIN: CRP: 1.2 mg/dL — ABNORMAL HIGH (ref <1.0)

## 2022-02-20 LAB — MAGNESIUM: Magnesium: 1.9 mg/dL (ref 1.7–2.4)

## 2022-02-20 MED ORDER — PANTOPRAZOLE SODIUM 40 MG PO TBEC
40.0000 mg | DELAYED_RELEASE_TABLET | Freq: Every day | ORAL | Status: DC
  Administered 2022-02-20 – 2022-02-25 (×5): 40 mg via ORAL
  Filled 2022-02-20 (×5): qty 1

## 2022-02-20 MED ORDER — LORAZEPAM 0.5 MG PO TABS
0.2500 mg | ORAL_TABLET | Freq: Every day | ORAL | Status: DC
  Administered 2022-02-20 – 2022-02-25 (×5): 0.25 mg via ORAL
  Filled 2022-02-20 (×5): qty 1

## 2022-02-20 MED ORDER — LORAZEPAM 1 MG PO TABS
1.0000 mg | ORAL_TABLET | Freq: Every day | ORAL | Status: DC
  Administered 2022-02-20 – 2022-02-24 (×5): 1 mg via ORAL
  Filled 2022-02-20 (×6): qty 1

## 2022-02-20 MED ORDER — INFLUENZA VAC A&B SA ADJ QUAD 0.5 ML IM PRSY
0.5000 mL | PREFILLED_SYRINGE | INTRAMUSCULAR | Status: DC
  Filled 2022-02-20: qty 0.5

## 2022-02-20 MED ORDER — GADOBUTROL 1 MMOL/ML IV SOLN
9.0000 mL | Freq: Once | INTRAVENOUS | Status: AC | PRN
Start: 2022-02-20 — End: 2022-02-20
  Administered 2022-02-20: 9 mL via INTRAVENOUS

## 2022-02-20 MED ORDER — AMLODIPINE BESYLATE 10 MG PO TABS
10.0000 mg | ORAL_TABLET | Freq: Every morning | ORAL | Status: DC
  Administered 2022-02-20 – 2022-02-25 (×5): 10 mg via ORAL
  Filled 2022-02-20 (×5): qty 1

## 2022-02-20 MED ORDER — AMANTADINE HCL 100 MG PO CAPS
100.0000 mg | ORAL_CAPSULE | Freq: Every day | ORAL | Status: DC
  Administered 2022-02-20 – 2022-02-25 (×5): 100 mg via ORAL
  Filled 2022-02-20 (×5): qty 1

## 2022-02-20 MED ORDER — TRAZODONE HCL 50 MG PO TABS
75.0000 mg | ORAL_TABLET | Freq: Every day | ORAL | Status: DC
  Administered 2022-02-20 – 2022-02-24 (×5): 75 mg via ORAL
  Filled 2022-02-20 (×5): qty 2

## 2022-02-20 MED ORDER — LACTATED RINGERS IV SOLN: INTRAVENOUS | Status: DC

## 2022-02-20 MED ORDER — TAMSULOSIN HCL 0.4 MG PO CAPS
0.4000 mg | ORAL_CAPSULE | Freq: Every day | ORAL | Status: DC
  Administered 2022-02-20 – 2022-02-24 (×4): 0.4 mg via ORAL
  Filled 2022-02-20 (×4): qty 1

## 2022-02-20 MED ORDER — HYDRALAZINE HCL 20 MG/ML IJ SOLN: 10.0000 mg | Freq: Four times a day (QID) | INTRAMUSCULAR | Status: DC | PRN

## 2022-02-20 NOTE — Consult Note (Addendum)
Consult Note  William [name] 09/08/1940  [id number].    Requesting MD: Dr. Candiss Norse Chief Complaint/Reason for Consult: choledocholithiasis, biliary pancreatitis  HPI:  81 y.o. male with medical history significant for BPH, CKD, h/o CVA, HTN, HLD, GERD, and dementia who presented to [facility] ED with abdominal pain and nasueas/vomiting on 9/8. Symptoms began the morning of presentation. He has dementia at baseline but was noted by family to be more confused than is typical for him.  Work up in ED significant for elevated lipase at 447, elevated LFTs, t bili 3.2 and abd US showing CBD 1 cm and extensive cholelithiasis. GI was consulted and MRCP completed showing cholelithiasis without inflammation concerning for cholecystitis, choledocholithiasis, no peripancreatic fluid or pseudocyst  Today he is oriented to self, year, place.  He tells me he is in the hospital to have his gallbladder removed. He is accompanied by his wife who assists with history as he does have some confusion.  He denies nausea currently.  He initially denies abdominal pain but on exam has mild tenderness in the right upper quadrant and right lower quadrant.  We have seen him in consultation during previous admission around the time of his last stroke - 07/2021.  At that time he had elevated LFTs and gallstones but HIDA negative and therefore no intervention recommended especially in light of his recent stroke leading to increased operative risk.  Surgical history includes open appendectomy, right inguinal hernia repair.  Denies substance use. Former cigarette smoker  He is on plavix with last dose Thursday 9/7 at 9 AM   ROS: Review of Systems  Constitutional:  Negative for chills and fever.  Respiratory:  Negative for cough and shortness of breath.   Cardiovascular:  Negative for palpitations and leg swelling.  Gastrointestinal:  Positive for abdominal pain, nausea and vomiting.  Genitourinary:  Negative for dysuria  and hematuria.    Family History  Problem Relation Age of Onset   Heart disease Mother        CHF   Bipolar disorder Mother    Heart disease Father        CHF    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   HTN (hypertension) 07/19/2015   Hyperlipidemia    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   Mild, related to stroke history   Nocturia    Renal insufficiency 06/25/2013   S/P carotid endarterectomy    BILATERAL ICA--  PATENT PER DUPLEX  05-19-2012   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   Right calf   Urinary frequency    Vitamin D deficiency     Past Surgical History:  Procedure Laterality Date   APPENDECTOMY  AS CHILD   CARDIOVASCULAR STRESS TEST  03-27-2012  DR CRENSHAW   LOW RISK LEXISCAN STUDY-- PROBABLE NORMAL PERFUSION AND SOFT TISSUE ATTENUATION/  NO ISCHEMIA/ EF 51%   CAROTID ENDARTERECTOMY Bilateral LEFT  11-12-2008  DR GREG HAYES   RIGHT ICA  2006  ([facility])   CYSTOSCOPY  W/ RETROGRADES Bilateral 06/22/2021   Procedure: CYSTOSCOPY WITH RETROGRADE PYELOGRAM;  Surgeon: Franchot Gallo, MD;  Location: [facility];  Service: Urology;  Laterality: Bilateral;   CYSTOSCOPY WITH LITHOLAPAXY N/A 02/26/2013   Procedure: CYSTOSCOPY WITH LITHOLAPAXY;  Surgeon: Franchot Gallo, MD;  Location: [facility];  Service: Urology;  Laterality: N/A;   EYE SURGERY  Jan. 2016   cataract surgery both eyes   INGUINAL HERNIA REPAIR Right 11-08-2006   IR KYPHO EA ADDL LEVEL THORACIC OR LUMBAR  02/12/2021   IR RADIOLOGIST EVAL & MGMT   02/18/2021   MASS EXCISION N/A 03/03/2016   Procedure: EXCISION OF BACK  MASS;  Surgeon: Stark Klein, MD;  Location: [place];  Service: General;  Laterality: N/A;   MOHS SURGERY Left 1/ 2016   Dr Nevada Crane-- Basal cell   PROSTATE SURGERY     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-C N/A 06/22/2021   Procedure: TRANSURETHRAL RESECTION OF BLADDER TUMOR;  Surgeon: Franchot Gallo, MD;  Location: [facility];  Service: Urology;  Laterality: N/A;   TRANSURETHRAL RESECTION OF PROSTATE N/A 02/26/2013   Procedure: TRANSURETHRAL RESECTION OF THE PROSTATE WITH GYRUS INSTRUMENTS;  Surgeon: Franchot Gallo, MD;  Location: [facility];  Service: Urology;  Laterality: N/A;   TRANSURETHRAL RESECTION OF PROSTATE N/A 06/22/2021   Procedure: TRANSURETHRAL RESECTION OF THE PROSTATE (TURP);  Surgeon: Franchot Gallo, MD;  Location: [facility];  Service: Urology;  Laterality: N/A;    Social History:  reports that he quit smoking about 17 years ago. His smoking use included cigarettes. He has a 80.00 pack-year smoking history. He has never used smokeless tobacco. He reports that he does not currently use alcohol. He reports that he does not use drugs.  Allergies:  Allergies  Allergen Reactions   Bee Venom Anaphylaxis   Strawberry Extract Hives   Latex Itching   Zetia [Ezetimibe] Other (See Comments)    Intolerance    Adhesive [Tape] Other (See Comments)    BLISTER   Statins Other (See Comments)    myalgias    Medications Prior to Admission  Medication Sig Dispense Refill   amantadine (SYMMETREL) 100 MG capsule Take 1 capsule (100 mg total) by mouth daily. 90 capsule 1   amLODipine (NORVASC) 10 MG tablet Take 1 tablet (10 mg total) by mouth every morning. 90 tablet 0   carBAMazepine (TEGRETOL) 100 MG/5ML suspension Take 5 mLs (100 mg total) by mouth 3 (three) times daily. (Patient taking differently: Take 100-200 mg by mouth 3  (three) times daily. Take 5 ml (100 mg) BID & Take 10 ml (200 mg) in the evening) 450 mL 12   Cholecalciferol (VITAMIN D3) 25 MCG (1000 UT) CHEW Chew 1 tablet by mouth daily.     clopidogrel (PLAVIX) 75 MG tablet Take 1 tablet (75 mg total) by mouth daily. 90 tablet 1   clotrimazole-betamethasone (LOTRISONE) cream Apply 1 Application on to the skin daily. (Patient taking differently: Apply 1 Application topically daily as needed (itching).) 30 g 0   ferrous sulfate 325 (65 FE) MG EC tablet Take 325 mg by mouth daily with breakfast.     fluticasone (CUTIVATE) 4.16 % cream 1 application daily as needed (psoriasis).  3   hydrALAZINE (APRESOLINE) 25 MG tablet Take 1 tablet (25 mg total) by mouth every 8 (eight) hours. 270 tablet 1   LORazepam (ATIVAN) 0.5 MG tablet Take 1/2 tablet (0.25 mg total) by mouth daily. 30 tablet  4   LORazepam ER (LOREEV XR) 1 MG CS24 Take 1 mg (1 capsule) by mouth at bedtime. 30 capsule 3   Multiple Vitamins-Minerals (ADULT ONE DAILY GUMMIES) CHEW Chew 1 tablet by mouth every morning.     pantoprazole (PROTONIX) 40 MG tablet Take 1 tablet (40 mg total) by mouth daily. 90 tablet 3   polyethylene glycol (MIRALAX / GLYCOLAX) 17 g packet Take 17 g by mouth daily. 14 each 0   tamsulosin (FLOMAX) 0.4 MG CAPS capsule Take 1 capsule (0.4 mg total) by mouth daily after supper. 90 capsule 1   traZODone (DESYREL) 150 MG tablet Take 1/2 tablet (75 mg total) by mouth at bedtime. 30 tablet 0   triamcinolone cream (KENALOG) 0.1 % Apply 1 application topically 2 (two) times daily. (Patient taking differently: Apply 1 Application topically 2 (two) times daily as needed (rash).) 30 g 0   hydrOXYzine (VISTARIL) 25 MG capsule Take 1 capsule by mouth everyday at 3:30 PM as needed (Patient taking differently: Take 25 mg by mouth daily as needed for anxiety. Take 1 capsule by mouth everyday PRN at 3:30 PM) 60 capsule 2   traMADol (ULTRAM) 50 MG tablet Take 1 tablet by mouth every 6 hours as needed  for 5 days. (Patient taking differently: Take 50 mg by mouth every 6 (six) hours as needed for moderate pain.) 20 tablet 0    Blood pressure (!) 145/79, pulse 71, temperature 98.1 F (36.7 C), temperature source Oral, resp. rate 15, height '5\' 9"'$  (1.753 m), weight 88.9 kg, SpO2 94 %. Physical Exam: General: pleasant, WD, male who is laying in bed in NAD HEENT: head is normocephalic, atraumatic.  Sclera are noninjected.  Pupils equal and round. EOMs intact.  Ears and nose without any masses or lesions.  Mouth is pink and moist Heart: regular, rate, and rhythm.  Normal s1,s2. No obvious murmurs, gallops, or rubs noted.  Palpable radial and pedal pulses bilaterally Lungs: Respiratory effort nonlabored Abd: soft, ND, +BS, no masses, hernias, or organomegaly.  Mild tenderness to palpation in right upper quadrant and right lower quadrant.  No rebound or guarding. MSK: all 4 extremities are symmetrical with no cyanosis, clubbing, or edema. Skin: warm and dry with no masses, lesions, or rashes Psych: A&Ox3 with an appropriate affect. Does have some confusion and wife assists with history   Results for orders placed or performed during the hospital encounter of 02/19/22 (from the past 48 hour(s))  Urinalysis, Routine w reflex microscopic Urine, Clean Catch     Status: Abnormal   Collection Time: 02/19/22 11:14 AM  Result Value Ref Range   Color, Urine AMBER (A) YELLOW    Comment: BIOCHEMICALS MAY BE AFFECTED BY COLOR   APPearance CLEAR CLEAR   Specific Gravity, Urine 1.018 1.005 - 1.030   pH 6.0 5.0 - 8.0   Glucose, UA NEGATIVE NEGATIVE mg/dL   Hgb urine dipstick NEGATIVE NEGATIVE   Bilirubin Urine NEGATIVE NEGATIVE   Ketones, ur NEGATIVE NEGATIVE mg/dL   Protein, ur 30 (A) NEGATIVE mg/dL   Nitrite NEGATIVE NEGATIVE   Leukocytes,Ua NEGATIVE NEGATIVE   RBC / HPF 0-5 0 - 5 RBC/hpf   WBC, UA 11-20 0 - 5 WBC/hpf   Bacteria, UA RARE (A) NONE SEEN   Squamous Epithelial / LPF 6-10 0 - 5   Mucus  PRESENT    Hyaline Casts, UA PRESENT     Comment: Performed at [facility], [address] [place], [place] [address]  Lipase,  blood     Status: Abnormal   Collection Time: 02/19/22 11:18 AM  Result Value Ref Range   Lipase 447 (H) 11 - 51 U/L    Comment: RESULTS CONFIRMED BY MANUAL DILUTION Performed at [facility], [place] [address], [place], [place] [address]   Comprehensive metabolic panel     Status: Abnormal   Collection Time: 02/19/22 11:18 AM  Result Value Ref Range   Sodium 138 135 - 145 mmol/L   Potassium 4.3 3.5 - 5.1 mmol/L   Chloride 106 98 - 111 mmol/L   CO2 26 22 - 32 mmol/L   Glucose, Bld 136 (H) 70 - 99 mg/dL    Comment: Glucose reference range applies only to samples taken after fasting for at least 8 hours.   BUN 31 (H) 8 - 23 mg/dL   Creatinine, Ser 1.64 (H) 0.61 - 1.24 mg/dL   Calcium 8.7 (L) 8.9 - 10.3 mg/dL   Total Protein 6.9 6.5 - 8.1 g/dL    Comment: RESULTS VERIFIED BY REPEAT TESTING   Albumin 3.4 (L) 3.5 - 5.0 g/dL   AST 361 (H) 15 - 41 U/L   ALT 265 (H) 0 - 44 U/L   Alkaline Phosphatase 567 (H) 38 - 126 U/L   Total Bilirubin 3.2 (H) 0.3 - 1.2 mg/dL   GFR, Estimated 42 (L) >60 mL/min    Comment: (NOTE) Calculated using the CKD-EPI Creatinine Equation (2021)    Anion gap 6 5 - 15    Comment: Performed at [facility] [address], [place], [place] [address]  CBC with Differential     Status: Abnormal   Collection Time: 02/19/22 11:18 AM  Result Value Ref Range   WBC 7.8 4.0 - 10.5 K/uL   RBC 3.60 (L) 4.22 - 5.81 MIL/uL   Hemoglobin 11.8 (L) 13.0 - 17.0 g/dL   HCT 35.2 (L) 39.0 - 52.0 %   MCV 97.8 80.0 - 100.0 fL   MCH 32.8 26.0 - 34.0 pg   MCHC 33.5 30.0 - 36.0 g/dL   RDW 13.4 11.5 - 15.5 %   Platelets 209 150 - 400 K/uL   nRBC 0.0 0.0 - 0.2 %   Neutrophils Relative % 81 %   Neutro Abs 6.2 1.7 - 7.7 K/uL   Lymphocytes Relative 9 %   Lymphs Abs 0.7 0.7 - 4.0 K/uL   Monocytes Relative 10 %   Monocytes Absolute 0.8 0.1  - 1.0 K/uL   Eosinophils Relative 0 %   Eosinophils Absolute 0.0 0.0 - 0.5 K/uL   Basophils Relative 0 %   Basophils Absolute 0.0 0.0 - 0.1 K/uL   Immature Granulocytes 0 %   Abs Immature Granulocytes 0.03 0.00 - 0.07 K/uL    Comment: Performed at [facility] [address], [place], [place] [address]  Comprehensive metabolic panel     Status: Abnormal   Collection Time: 02/20/22  7:42 AM  Result Value Ref Range   Sodium 139 135 - 145 mmol/L   Potassium 3.9 3.5 - 5.1 mmol/L   Chloride 108 98 - 111 mmol/L   CO2 23 22 - 32 mmol/L   Glucose, Bld 103 (H) 70 - 99 mg/dL    Comment: Glucose reference range applies only to samples taken after fasting for at least 8 hours.   BUN 26 (H) 8 - 23 mg/dL   Creatinine, Ser 1.64 (H) 0.61 - 1.24 mg/dL   Calcium 8.7 (L) 8.9 - 10.3 mg/dL  Total Protein 5.8 (L) 6.5 - 8.1 g/dL   Albumin 2.8 (L) 3.5 - 5.0 g/dL   AST 125 (H) 15 - 41 U/L   ALT 187 (H) 0 - 44 U/L   Alkaline Phosphatase 417 (H) 38 - 126 U/L   Total Bilirubin 0.8 0.3 - 1.2 mg/dL   GFR, Estimated 42 (L) >60 mL/min    Comment: (NOTE) Calculated using the CKD-EPI Creatinine Equation (2021)    Anion gap 8 5 - 15    Comment: Performed at [facility] [address], [place], [place] [address]  CBC     Status: Abnormal   Collection Time: 02/20/22  7:42 AM  Result Value Ref Range   WBC 5.4 4.0 - 10.5 K/uL   RBC 3.00 (L) 4.22 - 5.81 MIL/uL   Hemoglobin 9.9 (L) 13.0 - 17.0 g/dL   HCT 29.4 (L) 39.0 - 52.0 %   MCV 98.0 80.0 - 100.0 fL   MCH 33.0 26.0 - 34.0 pg   MCHC 33.7 30.0 - 36.0 g/dL   RDW 13.5 11.5 - 15.5 %   Platelets 161 150 - 400 K/uL   nRBC 0.0 0.0 - 0.2 %    Comment: Performed at [facility], [place] [address] [place], [place] [address]  Magnesium     Status: None   Collection Time: 02/20/22  7:42 AM  Result Value Ref Range   Magnesium 1.9 1.7 - 2.4 mg/dL    Comment: Performed at [facility] [address], [place], [place] [address]  C-reactive  protein     Status: Abnormal   Collection Time: 02/20/22  7:42 AM  Result Value Ref Range   CRP 1.2 (H) <1.0 mg/dL    Comment: Performed at [facility] [address] [place], [place] [address]  Protime-INR     Status: None   Collection Time: 02/20/22  7:42 AM  Result Value Ref Range   Prothrombin Time 14.4 11.4 - 15.2 seconds   INR 1.1 0.8 - 1.2    Comment: (NOTE) INR goal varies based on device and disease states. Performed at [facility], [place] [name], [place], [place] [address]   Type and screen     Status: None   Collection Time: 02/20/22  7:42 AM  Result Value Ref Range   ABO/RH(D) A POS    Antibody Screen NEG    Sample Expiration      02/23/2022,2359 Performed at [facility], [place] [address], [place],  [address]    *Note: Due to a large number of results and/or encounters for the requested time period, some results have not been displayed. A complete set of results can be found in Results Review.   MR ABDOMEN MRCP W WO CONTAST  Result Date: 02/20/2022 CLINICAL DATA:  Cholelithiasis, evaluate for choledocholithiasis EXAM: MRI ABDOMEN WITHOUT AND WITH CONTRAST (INCLUDING MRCP) TECHNIQUE: Multiplanar multisequence MR imaging of the abdomen was performed both before and after the administration of intravenous contrast. Heavily T2-weighted images of the biliary and pancreatic ducts were obtained, and three-dimensional MRCP images were rendered by post processing. CONTRAST:  42m GADAVIST GADOBUTROL 1 MMOL/ML IV SOLN COMPARISON:  Right upper quadrant ultrasound dated 02/19/2022. CT abdomen/pelvis dated 10/02/2021. FINDINGS: Motion degraded images. Lower chest: Lung bases are clear. Hepatobiliary: Liver is unremarkable. Large gallstones, without associated inflammatory changes to suggest acute cholecystitis. No intrahepatic ductal dilatation. Dilated common duct, measuring 8 mm. Multiple mid/distal CBD stones measuring up  to 6 mm (series 7/image 20).  Pancreas: Grossly unremarkable in this patient with history of acute pancreatitis. No peripancreatic fluid collection/pseudocyst. Spleen:  Within normal limits. Adrenals/Urinary Tract:  Adrenal glands are within normal limits. Bilateral renal cysts, including a dominant 6.1 cm right renal sinus cyst (series 8/image 23), benign (Bosniak I). No hydronephrosis. Stomach/Bowel: Stomach is notable for a small hiatal hernia. Visualized bowel is grossly unremarkable. Vascular/Lymphatic:  No evidence of abdominal aortic aneurysm. No suspicious abdominal lymphadenopathy. Other:  No abdominal ascites. Musculoskeletal: Mild degenerative changes of the lumbar spine. IMPRESSION: Motion degraded images. Cholelithiasis, without associated inflammatory changes to suggest acute cholecystitis. Choledocholithiasis with multiple mid/distal CBD stones measuring up to 6 mm. ERCP is suggested. No peripancreatic fluid collection/pseudocyst in this patient with reported history of acute pancreatitis. Electronically Signed   By: Julian Hy M.D.   On: 02/20/2022 01:45   US Abdomen Limited RUQ (LIVER/GB)  Result Date: 02/19/2022 CLINICAL DATA:  Pancreatitis in a 81 year old male. Assess for ductal dilation. EXAM: ULTRASOUND ABDOMEN LIMITED RIGHT UPPER QUADRANT COMPARISON:  April 2023 FINDINGS: Gallbladder: The gallbladder is filled with gallstones. Wall thickness is difficult to evaluate given the presence of numerous gallstones. The anterior wall does not appear grossly thickened. The posterior wall cannot be seen due to shadowing. No pericholecystic fluid. Calculi as large as 2 cm or greater, individual stones are difficult to evaluate. No reported tenderness over the gallbladder. Common bile duct: Diameter: 10 mm Liver: Lobular hepatic contours. Signs of fissural widening. Echogenic liver. Portal vein is patent on color Doppler imaging with normal direction of blood flow towards the liver. Other: Renal cysts of the RIGHT kidney are  better characterized on previous imaging. These are not fully evaluated on the current exam. Based on previous imaging no dedicated imaging follow-up of this area is recommended. IMPRESSION: Dilation of the common bile duct at 1 cm in the setting of acute pancreatitis based on clinical history. MRCP may be helpful for further evaluation to exclude choledocholithiasis in this patient with extensive cholelithiasis. Gallbladder not well assessed due to extensive cholelithiasis. No reported tenderness over the gallbladder at this time. Electronically Signed   By: Zetta Bills M.D.   On: 02/19/2022 15:12      Assessment/Plan Choledocholithiasis, cholecystitis?, biliary pancreatitis -MRCP with cholelithiasis and multiple mid to distal CBD stones.  Pancreas grossly unremarkable in setting of elevated lipase -Lipase 447, repeat in the a.m. Mild RUQ but no epigastric TTP on exam -GI planning ERCP tentatively for Monday. LFTs and T. bili elevated on admission but improved today.  T. bili now normalized at 0.8 - afebrile and hemodynamically stable without evidence of GB inflammation on imaging and WBC normal but with some RUQ pain. Agree with empiric antibiotics, currently on Zosyn -Continue to hold Plavix -Given cholelithiasis/choledocholithiasis think cholecystectomy is reasonable for definitive management. I have explained the procedure, risks, and aftercare of cholecystectomy.  Risks include but are not limited to bleeding, infection, wound problems, anesthesia, diarrhea, bile leak, injury to common bile duct/liver/intestine. Patient also with risk for worsened cognitive status/hospital delirium postoperatively given his baseline function. Wife is concerned about his operative risks given history of stroke but does wish to proceed with surgical intervention at this time. Tentatively plan for laparoscopic cholecystectomy following ERCP. Monitor LFTs and lipase. 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   Winferd Humphrey, [facility] 02/20/2022, 10:23 AM Please see Amion for pager number during day hours 7:00am-4:30pm

## 2022-02-20 NOTE — Evaluation (Signed)
Physical Therapy Evaluation Patient Details Name: William [name] MRN: [id number] DOB: May 21, 1941 Today's Date: 02/20/2022  History of Present Illness  81 yo male with onset of abd pain and emesis was admitted on 9/8.  Pti has acute cholecystitis and choledocholithiasis, with plan to avoid invasive tx given his stroke history.  PMHx:  stroke with L  hemiparesis, CKD3, HTN, vascular dementia  Clinical Impression  Pt was seen for assessment of his mobility with HHA, as pt has apparently been unable to coordinate RW or SPC in recent sessions with outpatient therapy.  He is slow to stand and walk but will take his time to move with occas stops.  Per wife, pt has been strongly leaning to the R side during attempts to use RW as well as SPC, which has made HHA his most reliable version of moving. Follow for work to go home with HHPT as planned and then progress to outpatient care.  Will anticipate he may make a full transition to outpatient care before this dc is done.  Encourage OOB in chair and to walk as much as possible, esp with MT assistance.  Wife is in agreement with POC.        Recommendations for follow up therapy are one component of a multi-disciplinary discharge planning process, led by the attending physician.  Recommendations may be updated based on patient status, additional functional criteria and insurance authorization.  Follow Up Recommendations Home health PT      Assistance Recommended at Discharge Intermittent Supervision/Assistance  Patient can return home with the following  A little help with walking and/or transfers;A little help with bathing/dressing/bathroom;Direct supervision/assist for medications management;Direct supervision/assist for financial management;Assist for transportation;Help with stairs or ramp for entrance    Equipment Recommendations None recommended by PT  Recommendations for Other Services       Functional Status Assessment Patient has had a recent  decline in their functional status and demonstrates the ability to make significant improvements in function in a reasonable and predictable amount of time.     Precautions / Restrictions Precautions Precautions: Fall Precaution Comments: L hemiparesis Restrictions Weight Bearing Restrictions: No Other Position/Activity Restrictions: pt struggles with walker and SPC      Mobility  Bed Mobility Overal bed mobility: Needs Assistance Bed Mobility: Rolling, Sidelying to Sit, Sit to Sidelying Rolling: Mod assist Sidelying to sit: Mod assist     Sit to sidelying: Mod assist      Transfers Overall transfer level: Needs assistance Equipment used: 1 person hand held assist Transfers: Sit to/from Stand Sit to Stand: Min assist           General transfer comment: sometimes have to repeat standing cues as pt is not always able to follow through    Ambulation/Gait Ambulation/Gait assistance: Min assist Gait Distance (Feet): 75 Feet Assistive device: 1 person hand held assist Gait Pattern/deviations: Step-to pattern, Step-through pattern, Decreased stride length, Wide base of support, Decreased weight shift to left Gait velocity: reduced Gait velocity interpretation: <1.31 ft/sec, indicative of household ambulator Pre-gait activities: standing balance ck General Gait Details: tends to minimize WB on LLE due to hemi changes and difficulty coordinating moves to walk heel to toe  Stairs            Wheelchair Mobility    Modified Rankin (Stroke Patients Only) Modified Rankin (Stroke Patients Only) Pre-Morbid Rankin Score: Moderate disability Modified Rankin: Moderately severe disability     Balance Overall balance assessment: Needs assistance Sitting-balance support: Feet supported  Sitting balance-Leahy Scale: Fair     Standing balance support: Single extremity supported Standing balance-Leahy Scale: Poor                               Pertinent  Vitals/Pain Pain Assessment Pain Assessment: No/denies pain    Home Living Family/patient expects to be discharged to:: Private residence Living Arrangements: Spouse/significant other Available Help at Discharge: Family Type of Home: House Home Access: Stairs to enter Entrance Stairs-Rails: None Entrance Stairs-Number of Steps: 1   Home Layout: One level Home Equipment: Cane - single point;Grab bars - tub/shower;Wheelchair - Systems analyst (2 wheels) Additional Comments: Wife is home with pt    Prior Function Prior Level of Function : Needs assist  Cognitive Assist : Mobility (cognitive) Mobility (Cognitive): Intermittent cues   Physical Assist : Mobility (physical) Mobility (physical): Gait;Transfers;Stairs   Mobility Comments: HHA as pt is losing control of sequence with an AD ADLs Comments: steps into shower per wife     Hand Dominance   Dominant Hand: Right    Extremity/Trunk Assessment   Upper Extremity Assessment Upper Extremity Assessment: LUE deficits/detail LUE Deficits / Details: dislocated L clavicle LUE Coordination: decreased gross motor    Lower Extremity Assessment Lower Extremity Assessment: Generalized weakness;LLE deficits/detail LLE Deficits / Details: mild LLE weakness and mild incoordination LLE Coordination: decreased gross motor       Communication   Communication: Expressive difficulties;Receptive difficulties  Cognition Arousal/Alertness: Awake/alert Behavior During Therapy: Flat affect, Impulsive Overall Cognitive Status: History of cognitive impairments - at baseline                                          General Comments General comments (skin integrity, edema, etc.): Noted on pt that with LLE symptoms and weakness from intake, he is just a little more weak than PLOF by wife's description    Exercises     Assessment/Plan    PT Assessment Patient needs continued PT services  PT  Problem List Decreased strength;Decreased activity tolerance;Decreased balance;Decreased mobility;Decreased coordination;Decreased cognition;Decreased knowledge of use of DME;Decreased safety awareness       PT Treatment Interventions Gait training;Stair training;Functional mobility training;Therapeutic activities;Therapeutic exercise;Balance training;Neuromuscular re-education;Patient/family education    PT Goals (Current goals can be found in the Care Plan section)  Acute Rehab PT Goals Patient Stated Goal: none stated PT Goal Formulation: With family Time For Goal Achievement: 03/06/22 Potential to Achieve Goals: Good    Frequency Min 3X/week     Co-evaluation               AM-PAC PT "6 Clicks" Mobility  Outcome Measure Help needed turning from your back to your side while in a flat bed without using bedrails?: A Lot Help needed moving from lying on your back to sitting on the side of a flat bed without using bedrails?: A Lot Help needed moving to and from a bed to a chair (including a wheelchair)?: A Lot Help needed standing up from a chair using your arms (e.g., wheelchair or bedside chair)?: A Little Help needed to walk in hospital room?: A Little Help needed climbing 3-5 steps with a railing? : Total 6 Click Score: 13    End of Session Equipment Utilized During Treatment: Gait belt Activity Tolerance: Patient tolerated treatment well Patient left: in bed;with call bell/phone  within reach;with bed alarm set;with family/visitor present Nurse Communication: Mobility status PT Visit Diagnosis: Unsteadiness on feet (R26.81);Muscle weakness (generalized) (M62.81);Difficulty in walking, not elsewhere classified (R26.2);Hemiplegia and hemiparesis Hemiplegia - Right/Left: Left Hemiplegia - dominant/non-dominant: Non-dominant Hemiplegia - caused by: Unspecified    Time: 8022-1798 PT Time Calculation (min) (ACUTE ONLY): 23 min   Charges:   PT Evaluation $PT Eval  Moderate Complexity: 1 Mod PT Treatments $Therapeutic Activity: 8-22 mins       Ramond Dial 02/20/2022, 11:58 AM  Mee Hives, PT PhD Acute Rehab Dept. Number: [place] [facility] [place]

## 2022-02-20 NOTE — Progress Notes (Addendum)
Progress Note  Covering for Dr. Adriana Mccallum  Primary GI: Dr. Benson Norway   Subjective  Chief Complaint:Gallstones pancreatitis  02/20/2022 MRCP Cholelithiasis without inflammation, Choledocholithiasis with multiple mid/distal CBD stones measuring up to 6 mm. No peripancreatic fluid collection/pseudocyst in this patient with reported history of acute pancreatitis Pending lipase  Patient sitting up in bed doing physical therapy. Denies nausea, vomiting.  Has any abdominal pain. Has had some chills but no fever. Had a bowel movement this morning, brown, formed.    Objective   Vital signs in last 24 hours: Temp:  [98 F (36.7 C)-98.1 F (36.7 C)] 98.1 F (36.7 C) (09/09 0808) Pulse Rate:  [65-71] 71 (09/09 0808) Resp:  [14-20] 15 (09/09 0808) BP: (114-177)/(63-101) 145/79 (09/09 0808) SpO2:  [94 %-100 %] 94 % (09/09 0808)   Last BM recorded by nurses in past 5 days Stool Type: Type 3 (Sausage shape with surface cracks) (02/20/2022 10:43 AM)  General:   male in no acute distress  Heart:  Regular rate and rhythm; no murmurs Pulm: Clear anteriorly; no wheezing Abdomen:  Soft, Obese AB, Active bowel sounds. No tenderness . Without guarding and Without rebound, No organomegaly appreciated. Extremities:  without  edema. Neurologic:  Alert and  oriented x4;  No focal deficits.  Psych:  Cooperative. Normal mood and affect.  Intake/Output from previous day: No intake/output data recorded. Intake/Output this shift: Total I/O In: 300 [P.O.:300] Out: -   Studies/Results: MR ABDOMEN MRCP W WO CONTAST  Result Date: 02/20/2022 CLINICAL DATA:  Cholelithiasis, evaluate for choledocholithiasis EXAM: MRI ABDOMEN WITHOUT AND WITH CONTRAST (INCLUDING MRCP) TECHNIQUE: Multiplanar multisequence MR imaging of the abdomen was performed both before and after the administration of intravenous contrast. Heavily T2-weighted images of the biliary and pancreatic ducts were obtained, and three-dimensional MRCP  images were rendered by post processing. CONTRAST:  54m GADAVIST GADOBUTROL 1 MMOL/ML IV SOLN COMPARISON:  Right upper quadrant ultrasound dated 02/19/2022. CT abdomen/pelvis dated 10/02/2021. FINDINGS: Motion degraded images. Lower chest: Lung bases are clear. Hepatobiliary: Liver is unremarkable. Large gallstones, without associated inflammatory changes to suggest acute cholecystitis. No intrahepatic ductal dilatation. Dilated common duct, measuring 8 mm. Multiple mid/distal CBD stones measuring up to 6 mm (series 7/image 20). Pancreas: Grossly unremarkable in this patient with history of acute pancreatitis. No peripancreatic fluid collection/pseudocyst. Spleen:  Within normal limits. Adrenals/Urinary Tract:  Adrenal glands are within normal limits. Bilateral renal cysts, including a dominant 6.1 cm right renal sinus cyst (series 8/image 23), benign (Bosniak I). No hydronephrosis. Stomach/Bowel: Stomach is notable for a small hiatal hernia. Visualized bowel is grossly unremarkable. Vascular/Lymphatic:  No evidence of abdominal aortic aneurysm. No suspicious abdominal lymphadenopathy. Other:  No abdominal ascites. Musculoskeletal: Mild degenerative changes of the lumbar spine. IMPRESSION: Motion degraded images. Cholelithiasis, without associated inflammatory changes to suggest acute cholecystitis. Choledocholithiasis with multiple mid/distal CBD stones measuring up to 6 mm. ERCP is suggested. No peripancreatic fluid collection/pseudocyst in this patient with reported history of acute pancreatitis. Electronically Signed   By: SJulian HyM.D.   On: 02/20/2022 01:45   [place]dom[place] Limited RUQ (LIVER/GB)  Result Date: 02/19/2022 CLINICAL DATA:  Pancreatitis in a 81year old male. Assess for ductal dilation. EXAM: ULTRASOUND ABDOMEN LIMITED RIGHT UPPER QUADRANT COMPARISON:  April 2023 FINDINGS: Gallbladder: The gallbladder is filled with gallstones. Wall thickness is difficult to evaluate given the presence of  numerous gallstones. The anterior wall does not appear grossly thickened. The posterior wall cannot be seen due to shadowing. No pericholecystic fluid.  Calculi as large as 2 cm or greater, individual stones are difficult to evaluate. No reported tenderness over the gallbladder. Common bile duct: Diameter: 10 mm Liver: Lobular hepatic contours. Signs of fissural widening. Echogenic liver. Portal vein is patent on color Doppler imaging with normal direction of blood flow towards the liver. Other: Renal cysts of the RIGHT kidney are better characterized on previous imaging. These are not fully evaluated on the current exam. Based on previous imaging no dedicated imaging follow-up of this area is recommended. IMPRESSION: Dilation of the common bile duct at 1 cm in the setting of acute pancreatitis based on clinical history. MRCP may be helpful for further evaluation to exclude choledocholithiasis in this patient with extensive cholelithiasis. Gallbladder not well assessed due to extensive cholelithiasis. No reported tenderness over the gallbladder at this time. Electronically Signed   By: Zetta Bills M.D.   On: 02/19/2022 15:12    Lab Results: Recent Labs    02/19/22 1118 02/20/22 0742  WBC 7.8 5.4  HGB 11.8* 9.9*  HCT 35.2* 29.4*  PLT 209 161   BMET Recent Labs    02/19/22 1118 02/20/22 0742  NA 138 139  K 4.3 3.9  CL 106 108  CO2 26 23  GLUCOSE 136* 103*  BUN 31* 26*  CREATININE 1.64* 1.64*  CALCIUM 8.7* 8.7*   LFT Recent Labs    02/20/22 0742  PROT 5.8*  ALBUMIN 2.8*  AST 125*  ALT 187*  ALKPHOS 417*  BILITOT 0.8   PT/INR Recent Labs    02/20/22 0742  LABPROT 14.4  INR 1.1     Impression/Plan:   Choledocholithiasis with questionable pancreatitis WBC 5.4 HGB 9.9 Platelets 161 AST 125 ALT 187  Alkphos 417 TBili 0.8 02/20/2022 INR 1.1  02/20/2022 MRCP Cholelithiasis without inflammation, Choledocholithiasis with multiple mid/distal CBD stones measuring up to 6 mm. No  peripancreatic fluid collection/pseudocyst in this patient with reported history of acute pancreatitis Tentatively plan for ERCP on Monday after Plavix wash out - pending repeat lipase but with recent abdominal exam doubtful acute pancreatitis. WBC stable- on Zosyn -Continue supportive care -Continue pain control. -Continue IV hydration, can do clear liquids today, n.p.o. at midnight monday -I thoroughly discussed procedure with the patient to include nature, alternatives, benefits, and risks (including but not limited to post ERCP pancreatitis, bleeding, infection, perforation, anesthesia/cardiac pulmonary complications). Discussed risk of pancreatitis associated with ERCP. Patient verbalized understanding and gave verbal consent to proceed with ERCP.  Acute on chronic anemia HGB 11.8, now at 9.9 Last BM last night, denies melena   CVA 07/2021 Plavix on hold, last dose 09/07 at 9 AM Continue to hold plavix   LOS: 1 day   Vladimir Crofts  02/20/2022, 1:46 PM   Attending physician's note   I have taken a history, reviewed the chart and examined the patient. I performed a substantive portion of this encounter, including complete performance of at least one of the key components, in conjunction with the APP. I agree with the APP's note, impression and recommendations.    MRCP with choledocholithiasis, no peripancreatic fluid or signs of necrosis in the pancreas Full liquid diet  N.p.o. after midnight tomorrow Tentative plan for ERCP on Monday with Dr. Benson Norway Continue to hold Plavix  The patient was provided an opportunity to ask questions and all were answered. The patient agreed with the plan and demonstrated an understanding of the instructions.   Damaris Hippo , MD [phone]

## 2022-02-20 NOTE — H&P (View-Only) (Signed)
Progress Note  Covering for Dr. Adriana Mccallum  Primary GI: Dr. Benson Norway   Subjective  Chief Complaint:Gallstones pancreatitis  02/20/2022 MRCP Cholelithiasis without inflammation, Choledocholithiasis with multiple mid/distal CBD stones measuring up to 6 mm. No peripancreatic fluid collection/pseudocyst in this patient with reported history of acute pancreatitis Pending lipase  Patient sitting up in bed doing physical therapy. Denies nausea, vomiting.  Has any abdominal pain. Has had some chills but no fever. Had a bowel movement this morning, brown, formed.    Objective   Vital signs in last 24 hours: Temp:  [98 F (36.7 C)-98.1 F (36.7 C)] 98.1 F (36.7 C) (09/09 0808) Pulse Rate:  [65-71] 71 (09/09 0808) Resp:  [14-20] 15 (09/09 0808) BP: (114-177)/(63-101) 145/79 (09/09 0808) SpO2:  [94 %-100 %] 94 % (09/09 0808)   Last BM recorded by nurses in past 5 days Stool Type: Type 3 (Sausage shape with surface cracks) (02/20/2022 10:43 AM)  General:   male in no acute distress  Heart:  Regular rate and rhythm; no murmurs Pulm: Clear anteriorly; no wheezing Abdomen:  Soft, Obese AB, Active bowel sounds. No tenderness . Without guarding and Without rebound, No organomegaly appreciated. Extremities:  without  edema. Neurologic:  Alert and  oriented x4;  No focal deficits.  Psych:  Cooperative. Normal mood and affect.  Intake/Output from previous day: No intake/output data recorded. Intake/Output this shift: Total I/O In: 300 [P.O.:300] Out: -   Studies/Results: MR ABDOMEN MRCP W WO CONTAST  Result Date: 02/20/2022 CLINICAL DATA:  Cholelithiasis, evaluate for choledocholithiasis EXAM: MRI ABDOMEN WITHOUT AND WITH CONTRAST (INCLUDING MRCP) TECHNIQUE: Multiplanar multisequence MR imaging of the abdomen was performed both before and after the administration of intravenous contrast. Heavily T2-weighted images of the biliary and pancreatic ducts were obtained, and three-dimensional MRCP  images were rendered by post processing. CONTRAST:  65m GADAVIST GADOBUTROL 1 MMOL/ML IV SOLN COMPARISON:  Right upper quadrant ultrasound dated 02/19/2022. CT abdomen/pelvis dated 10/02/2021. FINDINGS: Motion degraded images. Lower chest: Lung bases are clear. Hepatobiliary: Liver is unremarkable. Large gallstones, without associated inflammatory changes to suggest acute cholecystitis. No intrahepatic ductal dilatation. Dilated common duct, measuring 8 mm. Multiple mid/distal CBD stones measuring up to 6 mm (series 7/image 20). Pancreas: Grossly unremarkable in this patient with history of acute pancreatitis. No peripancreatic fluid collection/pseudocyst. Spleen:  Within normal limits. Adrenals/Urinary Tract:  Adrenal glands are within normal limits. Bilateral renal cysts, including a dominant 6.1 cm right renal sinus cyst (series 8/image 23), benign (Bosniak I). No hydronephrosis. Stomach/Bowel: Stomach is notable for a small hiatal hernia. Visualized bowel is grossly unremarkable. Vascular/Lymphatic:  No evidence of abdominal aortic aneurysm. No suspicious abdominal lymphadenopathy. Other:  No abdominal ascites. Musculoskeletal: Mild degenerative changes of the lumbar spine. IMPRESSION: Motion degraded images. Cholelithiasis, without associated inflammatory changes to suggest acute cholecystitis. Choledocholithiasis with multiple mid/distal CBD stones measuring up to 6 mm. ERCP is suggested. No peripancreatic fluid collection/pseudocyst in this patient with reported history of acute pancreatitis. Electronically Signed   By: SJulian HyM.D.   On: 02/20/2022 01:45   [place]dom[place] Limited RUQ (LIVER/GB)  Result Date: 02/19/2022 CLINICAL DATA:  Pancreatitis in a 81year old male. Assess for ductal dilation. EXAM: ULTRASOUND ABDOMEN LIMITED RIGHT UPPER QUADRANT COMPARISON:  April 2023 FINDINGS: Gallbladder: The gallbladder is filled with gallstones. Wall thickness is difficult to evaluate given the presence of  numerous gallstones. The anterior wall does not appear grossly thickened. The posterior wall cannot be seen due to shadowing. No pericholecystic fluid.  Calculi as large as 2 cm or greater, individual stones are difficult to evaluate. No reported tenderness over the gallbladder. Common bile duct: Diameter: 10 mm Liver: Lobular hepatic contours. Signs of fissural widening. Echogenic liver. Portal vein is patent on color Doppler imaging with normal direction of blood flow towards the liver. Other: Renal cysts of the RIGHT kidney are better characterized on previous imaging. These are not fully evaluated on the current exam. Based on previous imaging no dedicated imaging follow-up of this area is recommended. IMPRESSION: Dilation of the common bile duct at 1 cm in the setting of acute pancreatitis based on clinical history. MRCP may be helpful for further evaluation to exclude choledocholithiasis in this patient with extensive cholelithiasis. Gallbladder not well assessed due to extensive cholelithiasis. No reported tenderness over the gallbladder at this time. Electronically Signed   By: Zetta Bills M.D.   On: 02/19/2022 15:12    Lab Results: Recent Labs    02/19/22 1118 02/20/22 0742  WBC 7.8 5.4  HGB 11.8* 9.9*  HCT 35.2* 29.4*  PLT 209 161   BMET Recent Labs    02/19/22 1118 02/20/22 0742  NA 138 139  K 4.3 3.9  CL 106 108  CO2 26 23  GLUCOSE 136* 103*  BUN 31* 26*  CREATININE 1.64* 1.64*  CALCIUM 8.7* 8.7*   LFT Recent Labs    02/20/22 0742  PROT 5.8*  ALBUMIN 2.8*  AST 125*  ALT 187*  ALKPHOS 417*  BILITOT 0.8   PT/INR Recent Labs    02/20/22 0742  LABPROT 14.4  INR 1.1     Impression/Plan:   Choledocholithiasis with questionable pancreatitis WBC 5.4 HGB 9.9 Platelets 161 AST 125 ALT 187  Alkphos 417 TBili 0.8 02/20/2022 INR 1.1  02/20/2022 MRCP Cholelithiasis without inflammation, Choledocholithiasis with multiple mid/distal CBD stones measuring up to 6 mm. No  peripancreatic fluid collection/pseudocyst in this patient with reported history of acute pancreatitis Tentatively plan for ERCP on Monday after Plavix wash out - pending repeat lipase but with recent abdominal exam doubtful acute pancreatitis. WBC stable- on Zosyn -Continue supportive care -Continue pain control. -Continue IV hydration, can do clear liquids today, n.p.o. at midnight monday -I thoroughly discussed procedure with the patient to include nature, alternatives, benefits, and risks (including but not limited to post ERCP pancreatitis, bleeding, infection, perforation, anesthesia/cardiac pulmonary complications). Discussed risk of pancreatitis associated with ERCP. Patient verbalized understanding and gave verbal consent to proceed with ERCP.  Acute on chronic anemia HGB 11.8, now at 9.9 Last BM last night, denies melena   CVA 07/2021 Plavix on hold, last dose 09/07 at 9 AM Continue to hold plavix   LOS: 1 day   William [name]  02/20/2022, 1:46 PM   Attending physician's note   I have taken a history, reviewed the chart and examined the patient. I performed a substantive portion of this encounter, including complete performance of at least one of the key components, in conjunction with the APP. I agree with the APP's note, impression and recommendations.    MRCP with choledocholithiasis, no peripancreatic fluid or signs of necrosis in the pancreas Full liquid diet  N.p.o. after midnight tomorrow Tentative plan for ERCP on Monday with Dr. Benson Norway Continue to hold Plavix  The patient was provided an opportunity to ask questions and all were answered. The patient agreed with the plan and demonstrated an understanding of the instructions.   Damaris Hippo , MD [phone]

## 2022-02-20 NOTE — Progress Notes (Signed)
PROGRESS NOTE                                                                                                                                                                                                             Patient Demographics:    William [name], is a 81 y.o. male, DOB - 01-May-1941, DJM:[id number]  Outpatient Primary MD for the patient is Ann Held, DO    LOS - 1  Admit date - 02/19/2022    Chief Complaint  Patient presents with   Abdominal Pain   Emesis   Nausea       Brief Narrative (HPI from H&P)   81 y.o. male with medical history significant of BPH; stage 3 CKD; h/o CVA; HTN; HLD; and mild vascular dementia presenting with abdominal pain.  He reports that he vomited this AM.  He had abdominal pain last night.  This AM, his emesis was dark with specks in it, wife thought bilious.  Him to the ER where work-up was consistent with acute cholecystitis, choledocholithiasis and he was admitted to the hospital.   Subjective:    Keturah Barre today has, No headache, No chest pain, No abdominal pain - No Nausea, No new weakness tingling or numbness, no shortness of breath.   Assessment  & Plan :    Acute cholecystitis in the setting of choledocholithiasis. He is being treated with bowel rest, IV fluids, empiric IV antibiotics, MRCP noted, GI general surgery following.  He is on Plavix which is on hold since admission on 02/19/2022, GI will contemplate ERCP in the next few days followed with most likely cholecystectomy.  Currently no signs of sepsis symptoms have improved.  Continue to monitor with supportive care.    History of CVA with vascular dementia.  Plavix on hold due to #1 above, at risk for delirium, continue to monitor with supportive care.  Minimize narcotics and benzodiazepines.  Hypertension.  On Norvasc and as needed hydralazine.  BPH.  Continue Flomax.  Stage IIIb CKD.  Currently on  baseline monitor.      Condition - Extremely Guarded  Family Communication  : wife Cavion Faiola [phone] bedside on 02/20/22  Code Status : DNR  Consults  : GI, general surgery  PUD Prophylaxis : PPI   Procedures  :     MRCP -  Motion degraded images. Cholelithiasis, without associated inflammatory changes to suggest acute cholecystitis. Choledocholithiasis with multiple mid/distal CBD stones measuring up to 6 mm. ERCP is suggested. No peripancreatic fluid collection/pseudocyst in this patient with reported history of acute pancreatitis.      Disposition Plan  :    Status is: Inpatient  DVT Prophylaxis  :    SCDs Start: 02/19/22 1817    Lab Results  Component Value Date   PLT 161 02/20/2022    Diet :  Diet Order             Diet clear liquid Room service appropriate? Yes; Fluid consistency: Thin  Diet effective now                    Inpatient Medications  Scheduled Meds:  amantadine  100 mg Oral Daily   amLODipine  10 mg Oral q morning   carBAMazepine  100 mg Oral BID   carBAMazepine  200 mg Oral QHS   [START ON 02/21/2022] influenza vaccine adjuvanted  0.5 mL Intramuscular Tomorrow-1000   LORazepam  0.25 mg Oral Daily   LORazepam ER  1 mg Oral QHS   pantoprazole  40 mg Oral Daily   tamsulosin  0.4 mg Oral QPC supper   traZODone  75 mg Oral QHS   Continuous Infusions:  lactated ringers 75 mL/hr at 02/19/22 2221   piperacillin-tazobactam (ZOSYN)  IV 3.375 g (02/20/22 0155)   PRN Meds:.acetaminophen **OR** acetaminophen, haloperidol lactate, hydrALAZINE, LORazepam, morphine injection, ondansetron **OR** ondansetron (ZOFRAN) IV  Antibiotics  :    Anti-infectives (From admission, onward)    Start     Dose/Rate Route Frequency Ordered Stop   02/20/22 0000  piperacillin-tazobactam (ZOSYN) IVPB 3.375 g        3.375 g 12.5 mL/hr over 240 Minutes Intravenous Every 8 hours 02/19/22 1833     02/19/22 1615  piperacillin-tazobactam (ZOSYN) IVPB 3.375 g         3.375 g 100 mL/hr over 30 Minutes Intravenous  Once 02/19/22 1601 02/19/22 1831        Time Spent in minutes  30   Lala Lund M.D on 02/20/2022 at 9:27 AM  To page go to [email or web address]   [facility] -  Office  [phone]  See all Orders from today for further details    Objective:   Vitals:   02/19/22 2007 02/19/22 2009 02/19/22 2108 02/20/22 0808  BP: (!) 162/63  (!) 177/67 (!) 145/79  Pulse: 69  69 71  Resp: '18  18 15  '$ Temp:  98.1 F (36.7 C) 98.1 F (36.7 C) 98.1 F (36.7 C)  TempSrc:  Oral Oral Oral  SpO2: 100%  99% 94%  Weight:      Height:        Wt Readings from Last 3 Encounters:  02/19/22 88.9 kg  01/04/22 87.6 kg  12/31/21 87.5 kg    No intake or output data in the 24 hours ending 02/20/22 3536   Physical Exam  Awake Alert, No new F.N deficits, Normal affect [place].AT,PERRAL Supple Neck, No JVD,   Symmetrical Chest wall movement, Good air movement bilaterally, CTAB RRR,No Gallops,Rubs or new Murmurs,  +ve B.Sounds, Abd Soft, No tenderness,   No Cyanosis, Clubbing or edema       Data Review:    CBC Recent Labs  Lab 02/19/22 1118 02/20/22 0742  WBC 7.8 5.4  HGB 11.8* 9.9*  HCT 35.2* 29.4*  PLT 209 161  MCV 97.8 98.0  MCH 32.8 33.0  MCHC 33.5 33.7  RDW 13.4 13.5  LYMPHSABS 0.7  --   MONOABS 0.8  --   EOSABS 0.0  --   BASOSABS 0.0  --     Electrolytes Recent Labs  Lab 02/19/22 1118 02/20/22 0742  NA 138 139  K 4.3 3.9  CL 106 108  CO2 26 23  GLUCOSE 136* 103*  BUN 31* 26*  CREATININE 1.64* 1.64*  CALCIUM 8.7* 8.7*  AST 361* 125*  ALT 265* 187*  ALKPHOS 567* 417*  BILITOT 3.2* 0.8  ALBUMIN 3.4* 2.8*  MG  --  1.9  CRP  --  1.2*  INR  --  1.1    ------------------------------------------------------------------------------------------------------------------ No results for input(s): "CHOL", "HDL", "LDLCALC", "TRIG", "CHOLHDL", "LDLDIRECT" in the last 72 hours.  Lab Results  Component Value Date    HGBA1C 5.3 07/27/2021    Micro Results No results found for this or any previous visit (from the past 240 hour(s)).  Radiology Reports MR ABDOMEN MRCP W WO CONTAST  Result Date: 02/20/2022 CLINICAL DATA:  Cholelithiasis, evaluate for choledocholithiasis EXAM: MRI ABDOMEN WITHOUT AND WITH CONTRAST (INCLUDING MRCP) TECHNIQUE: Multiplanar multisequence MR imaging of the abdomen was performed both before and after the administration of intravenous contrast. Heavily T2-weighted images of the biliary and pancreatic ducts were obtained, and three-dimensional MRCP images were rendered by post processing. CONTRAST:  58m GADAVIST GADOBUTROL 1 MMOL/ML IV SOLN COMPARISON:  Right upper quadrant ultrasound dated 02/19/2022. CT abdomen/pelvis dated 10/02/2021. FINDINGS: Motion degraded images. Lower chest: Lung bases are clear. Hepatobiliary: Liver is unremarkable. Large gallstones, without associated inflammatory changes to suggest acute cholecystitis. No intrahepatic ductal dilatation. Dilated common duct, measuring 8 mm. Multiple mid/distal CBD stones measuring up to 6 mm (series 7/image 20). Pancreas: Grossly unremarkable in this patient with history of acute pancreatitis. No peripancreatic fluid collection/pseudocyst. Spleen:  Within normal limits. Adrenals/Urinary Tract:  Adrenal glands are within normal limits. Bilateral renal cysts, including a dominant 6.1 cm right renal sinus cyst (series 8/image 23), benign (Bosniak I). No hydronephrosis. Stomach/Bowel: Stomach is notable for a small hiatal hernia. Visualized bowel is grossly unremarkable. Vascular/Lymphatic:  No evidence of abdominal aortic aneurysm. No suspicious abdominal lymphadenopathy. Other:  No abdominal ascites. Musculoskeletal: Mild degenerative changes of the lumbar spine. IMPRESSION: Motion degraded images. Cholelithiasis, without associated inflammatory changes to suggest acute cholecystitis. Choledocholithiasis with multiple mid/distal CBD stones  measuring up to 6 mm. ERCP is suggested. No peripancreatic fluid collection/pseudocyst in this patient with reported history of acute pancreatitis. Electronically Signed   By: SJulian HyM.D.   On: 02/20/2022 01:45   [place]Abdomen Limited RUQ (LIVER/GB)  Result Date: 02/19/2022 CLINICAL DATA:  Pancreatitis in a 81year old male. Assess for ductal dilation. EXAM: ULTRASOUND ABDOMEN LIMITED RIGHT UPPER QUADRANT COMPARISON:  April 2023 FINDINGS: Gallbladder: The gallbladder is filled with gallstones. Wall thickness is difficult to evaluate given the presence of numerous gallstones. The anterior wall does not appear grossly thickened. The posterior wall cannot be seen due to shadowing. No pericholecystic fluid. Calculi as large as 2 cm or greater, individual stones are difficult to evaluate. No reported tenderness over the gallbladder. Common bile duct: Diameter: 10 mm Liver: Lobular hepatic contours. Signs of fissural widening. Echogenic liver. Portal vein is patent on color Doppler imaging with normal direction of blood flow towards the liver. Other: Renal cysts of the RIGHT kidney are better characterized on previous imaging. These are not fully evaluated on the current exam. Based on previous imaging no  dedicated imaging follow-up of this area is recommended. IMPRESSION: Dilation of the common bile duct at 1 cm in the setting of acute pancreatitis based on clinical history. MRCP may be helpful for further evaluation to exclude choledocholithiasis in this patient with extensive cholelithiasis. Gallbladder not well assessed due to extensive cholelithiasis. No reported tenderness over the gallbladder at this time. Electronically Signed   By: Zetta Bills M.D.   On: 02/19/2022 15:12

## 2022-02-20 NOTE — Evaluation (Signed)
Occupational Therapy Evaluation Patient Details Name: William [name] MRN: [id number] DOB: Nov 28, 1940 Today's Date: 02/20/2022   History of Present Illness 81 yo male with onset of abd pain and emesis was admitted on 9/8.  Pti has acute cholecystitis and choledocholithiasis, with plan to avoid invasive tx given his stroke history.  PMHx:  stroke with L  hemiparesis, CKD3, HTN, vascular dementia   Clinical Impression   Pt reports receiving assist at baseline for ADLs and does not use AD for mobility, lives with spouse who can assist at d/c. Pt currently needing min-mod A for ADLs, and 1 person handheld assist/mod A for transfers, and mod A for bed mobility. Pt needing increased cuing for safety due to L sided weakness and sequencing of tasks. Pt presenting with impairments listed below, will follow acutely. Recommend HHOT at d/c.     Recommendations for follow up therapy are one component of a multi-disciplinary discharge planning process, led by the attending physician.  Recommendations may be updated based on patient status, additional functional criteria and insurance authorization.   Follow Up Recommendations  Home health OT    Assistance Recommended at Discharge Frequent or constant Supervision/Assistance  Patient can return home with the following A little help with walking and/or transfers;A lot of help with bathing/dressing/bathroom;Assistance with cooking/housework;Direct supervision/assist for medications management;Assist for transportation;Direct supervision/assist for financial management;Help with stairs or ramp for entrance    Functional Status Assessment  Patient has had a recent decline in their functional status and demonstrates the ability to make significant improvements in function in a reasonable and predictable amount of time.  Equipment Recommendations  None recommended by OT;Other (comment) (pt has all needed DME)    Recommendations for Other Services PT consult      Precautions / Restrictions Precautions Precautions: Fall Precaution Comments: L hemiparesis Restrictions Weight Bearing Restrictions: No Other Position/Activity Restrictions: pt struggles with walker and SPC      Mobility Bed Mobility Overal bed mobility: Needs Assistance Bed Mobility: Rolling, Sidelying to Sit, Sit to Sidelying Rolling: Mod assist Sidelying to sit: Mod assist     Sit to sidelying: Mod assist      Transfers Overall transfer level: Needs assistance Equipment used: 1 person hand held assist Transfers: Sit to/from Stand Sit to Stand: Mod assist           General transfer comment: mod A from elevated surface      Balance Overall balance assessment: Needs assistance Sitting-balance support: Feet supported Sitting balance-Leahy Scale: Fair     Standing balance support: Single extremity supported Standing balance-Leahy Scale: Poor                             ADL either performed or assessed with clinical judgement   ADL Overall ADL's : Needs assistance/impaired Eating/Feeding: Minimal assistance   Grooming: Minimal assistance   Upper Body Bathing: Minimal assistance   Lower Body Bathing: Moderate assistance   Upper Body Dressing : Minimal assistance   Lower Body Dressing: Moderate assistance   Toilet Transfer: Moderate assistance;Ambulation;Regular Toilet;Minimal assistance   Toileting- Clothing Manipulation and Hygiene: Moderate assistance       Functional mobility during ADLs: Moderate assistance       Vision   Vision Assessment?: No apparent visual deficits     Perception     Praxis      Pertinent Vitals/Pain Pain Assessment Pain Assessment: No/denies pain     Hand Dominance Right  Extremity/Trunk Assessment Upper Extremity Assessment Upper Extremity Assessment: LUE deficits/detail LUE Deficits / Details: dislocated L clavicle, proximal vs distal weakness from prior CVA LUE Coordination: decreased gross  motor;decreased fine motor   Lower Extremity Assessment Lower Extremity Assessment: Defer to PT evaluation LLE Deficits / Details: mild LLE weakness and mild incoordination LLE Coordination: decreased gross motor   Cervical / Trunk Assessment Cervical / Trunk Assessment: Normal   Communication Communication Communication: Expressive difficulties;Receptive difficulties (softspoken)   Cognition Arousal/Alertness: Awake/alert Behavior During Therapy: Flat affect, Impulsive Overall Cognitive Status: History of cognitive impairments - at baseline                                       General Comments  VSS on RA    Exercises     Shoulder Instructions      Home Living Family/patient expects to be discharged to:: Private residence Living Arrangements: Spouse/significant other Available Help at Discharge: Family Type of Home: House Home Access: Stairs to enter Technical brewer of Steps: 1 Entrance Stairs-Rails: None Home Layout: One level     Bathroom Shower/Tub: Teacher, early years/pre: Standard     Home Equipment: Cane - single point;Grab bars - tub/shower;Wheelchair - Systems analyst (2 wheels)   Additional Comments: Wife is home with pt      Prior Functioning/Environment Prior Level of Function : Needs assist  Cognitive Assist : Mobility (cognitive) Mobility (Cognitive): Intermittent cues   Physical Assist : Mobility (physical) Mobility (physical): Gait;Transfers;Stairs   Mobility Comments: HHA as pt is losing control of sequence with an AD ADLs Comments: steps into shower per wife        OT Problem List: Decreased strength;Decreased range of motion;Decreased activity tolerance;Impaired balance (sitting and/or standing);Decreased cognition;Decreased safety awareness;Cardiopulmonary status limiting activity;Impaired UE functional use      OT Treatment/Interventions: Self-care/ADL training;Therapeutic  exercise;Therapeutic activities;Cognitive remediation/compensation;Visual/perceptual remediation/compensation;Balance training;Patient/family education;Energy conservation;DME and/or AE instruction    OT Goals(Current goals can be found in the care plan section) Acute Rehab OT Goals Patient Stated Goal: none stated OT Goal Formulation: With patient Time For Goal Achievement: 03/06/22 Potential to Achieve Goals: Good ADL Goals Pt Will Perform Grooming: with min assist;standing Pt Will Perform Upper Body Dressing: with min assist;standing Pt Will Perform Lower Body Dressing: with min assist;sit to/from stand;sitting/lateral leans Pt Will Transfer to Toilet: with min assist;regular height toilet;ambulating Additional ADL Goal #1: pt will complete bed mobility min A in prep for ADLs  OT Frequency: Min 2X/week    Co-evaluation              AM-PAC OT "6 Clicks" Daily Activity     Outcome Measure Help from another person eating meals?: None Help from another person taking care of personal grooming?: A Little Help from another person toileting, which includes using toliet, bedpan, or urinal?: A Lot Help from another person bathing (including washing, rinsing, drying)?: A Lot Help from another person to put on and taking off regular upper body clothing?: A Little Help from another person to put on and taking off regular lower body clothing?: A Lot 6 Click Score: 16   End of Session Equipment Utilized During Treatment: Gait belt Nurse Communication: Mobility status  Activity Tolerance: Patient tolerated treatment well Patient left: in bed;with call bell/phone within reach;with bed alarm set  OT Visit Diagnosis: Unsteadiness on feet (R26.81);Other abnormalities of gait and mobility (R26.89);Muscle weakness (  generalized) (M62.81)                Time: 8937-3428 OT Time Calculation (min): 24 min Charges:  OT General Charges $OT Visit: 1 Visit OT Evaluation $OT Eval Moderate Complexity:  1 Mod OT Treatments $Self Care/Home Management : 8-22 mins  Lynnda Child, OTD, OTR/L Acute Rehab [phone]) [phone] - [place] 02/20/2022, 2:56 PM

## 2022-02-21 DIAGNOSIS — K8050 Calculus of bile duct without cholangitis or cholecystitis without obstruction: Secondary | ICD-10-CM | POA: Diagnosis not present

## 2022-02-21 LAB — COMPREHENSIVE METABOLIC PANEL
ALT: 136 U/L — ABNORMAL HIGH (ref 0–44)
AST: 62 U/L — ABNORMAL HIGH (ref 15–41)
Albumin: 2.7 g/dL — ABNORMAL LOW (ref 3.5–5.0)
Alkaline Phosphatase: 380 U/L — ABNORMAL HIGH (ref 38–126)
Anion gap: 7 (ref 5–15)
BUN: 18 mg/dL (ref 8–23)
CO2: 25 mmol/L (ref 22–32)
Calcium: 8.6 mg/dL — ABNORMAL LOW (ref 8.9–10.3)
Chloride: 107 mmol/L (ref 98–111)
Creatinine, Ser: 1.67 mg/dL — ABNORMAL HIGH (ref 0.61–1.24)
GFR, Estimated: 41 mL/min — ABNORMAL LOW (ref >60)
Glucose, Bld: 95 mg/dL (ref 70–99)
Potassium: 3.8 mmol/L (ref 3.5–5.1)
Sodium: 139 mmol/L (ref 135–145)
Total Bilirubin: 0.7 mg/dL (ref 0.3–1.2)
Total Protein: 5.6 g/dL — ABNORMAL LOW (ref 6.5–8.1)

## 2022-02-21 LAB — CBC WITH DIFFERENTIAL/PLATELET
Abs Immature Granulocytes: 0.01 10*3/uL (ref 0.00–0.07)
Basophils Absolute: 0 10*3/uL (ref 0.0–0.1)
Basophils Relative: 1 %
Eosinophils Absolute: 0.2 10*3/uL (ref 0.0–0.5)
Eosinophils Relative: 4 %
HCT: 27.2 % — ABNORMAL LOW (ref 39.0–52.0)
Hemoglobin: 9.3 g/dL — ABNORMAL LOW (ref 13.0–17.0)
Immature Granulocytes: 0 %
Lymphocytes Relative: 35 %
Lymphs Abs: 2 10*3/uL (ref 0.7–4.0)
MCH: 33.1 pg (ref 26.0–34.0)
MCHC: 34.2 g/dL (ref 30.0–36.0)
MCV: 96.8 fL (ref 80.0–100.0)
Monocytes Absolute: 0.6 10*3/uL (ref 0.1–1.0)
Monocytes Relative: 11 %
Neutro Abs: 2.7 10*3/uL (ref 1.7–7.7)
Neutrophils Relative %: 49 %
Platelets: 154 10*3/uL (ref 150–400)
RBC: 2.81 MIL/uL — ABNORMAL LOW (ref 4.22–5.81)
RDW: 13.5 % (ref 11.5–15.5)
WBC: 5.5 10*3/uL (ref 4.0–10.5)
nRBC: 0 % (ref 0.0–0.2)

## 2022-02-21 LAB — C-REACTIVE PROTEIN: CRP: 1.4 mg/dL — ABNORMAL HIGH (ref <1.0)

## 2022-02-21 LAB — BRAIN NATRIURETIC PEPTIDE: B Natriuretic Peptide: 38.3 pg/mL (ref 0.0–100.0)

## 2022-02-21 LAB — GLUCOSE, CAPILLARY
Glucose-Capillary: 98 mg/dL (ref 70–99)
Glucose-Capillary: 157 mg/dL — ABNORMAL HIGH (ref 70–99)
Glucose-Capillary: 129 mg/dL — ABNORMAL HIGH (ref 70–99)
Glucose-Capillary: 125 mg/dL — ABNORMAL HIGH (ref 70–99)

## 2022-02-21 LAB — LIPASE, BLOOD: Lipase: 62 U/L — ABNORMAL HIGH (ref 11–51)

## 2022-02-21 LAB — MAGNESIUM: Magnesium: 1.8 mg/dL (ref 1.7–2.4)

## 2022-02-21 MED ORDER — HEPARIN SODIUM (PORCINE) 5000 UNIT/ML IJ SOLN
5000.0000 [IU] | Freq: Three times a day (TID) | INTRAMUSCULAR | Status: DC
  Administered 2022-02-21: 5000 [IU] via SUBCUTANEOUS
  Filled 2022-02-21 (×2): qty 1

## 2022-02-21 MED ORDER — FUROSEMIDE 20 MG PO TABS
20.0000 mg | ORAL_TABLET | Freq: Once | ORAL | Status: AC
  Administered 2022-02-21: 20 mg via ORAL
  Filled 2022-02-21: qty 1

## 2022-02-21 MED ORDER — HEPARIN SODIUM (PORCINE) 5000 UNIT/ML IJ SOLN
5000.0000 [IU] | Freq: Three times a day (TID) | INTRAMUSCULAR | Status: DC
  Administered 2022-02-23 – 2022-02-25 (×5): 5000 [IU] via SUBCUTANEOUS
  Filled 2022-02-21 (×6): qty 1

## 2022-02-21 NOTE — Progress Notes (Signed)
Progress Note     Subjective: Oriented to self, place, and year. States he is in the hospital for his history of strokes but does remember our conversation yesterday. He has no pain this am and denies nausea.  Family is not bedside this am  Objective: Vital signs in last 24 hours: Temp:  [97.7 F (36.5 C)-98.1 F (36.7 C)] 98.1 F (36.7 C) (09/10 0415) Pulse Rate:  [63-71] 64 (09/10 0415) Resp:  [15-18] 18 (09/10 0415) BP: (131-145)/(53-79) 140/53 (09/10 0415) SpO2:  [94 %-97 %] 97 % (09/10 0415) Last BM Date : 02/20/22  Intake/Output from previous day: 09/09 0701 - 09/10 0700 In: 2490 [P.O.:1320; I.V.:988.2; IV Piggyback:181.8] Out: 100 [Urine:100] Intake/Output this shift: No intake/output data recorded.  PE: General: pleasant, WD, male who is laying in bed in NAD Lungs: Respiratory effort nonlabored Abd: soft, ND, +BS. Very mild RLQ pain without rebound or guarding. No RUQ or epigastric pain to palpation MSK: all 4 extremities are symmetrical with no cyanosis, clubbing, or edema. Skin: warm and dry with no masses, lesions, or rashes Psych: A&Ox3 with an appropriate affect.    Lab Results:  Recent Labs    02/20/22 0742 02/21/22 0142  WBC 5.4 5.5  HGB 9.9* 9.3*  HCT 29.4* 27.2*  PLT 161 154   BMET Recent Labs    02/20/22 0742 02/21/22 0142  NA 139 139  K 3.9 3.8  CL 108 107  CO2 23 25  GLUCOSE 103* 95  BUN 26* 18  CREATININE 1.64* 1.67*  CALCIUM 8.7* 8.6*   PT/INR Recent Labs    02/20/22 0742  LABPROT 14.4  INR 1.1   CMP     Component Value Date/Time   NA 139 02/21/2022 0142   NA 142 04/25/2019 1000   K 3.8 02/21/2022 0142   CL 107 02/21/2022 0142   CO2 25 02/21/2022 0142   GLUCOSE 95 02/21/2022 0142   BUN 18 02/21/2022 0142   BUN 30 (H) 04/25/2019 1000   CREATININE 1.67 (H) 02/21/2022 0142   CREATININE 1.71 (H) 12/25/2021 1433   CALCIUM 8.6 (L) 02/21/2022 0142   PROT 5.6 (L) 02/21/2022 0142   PROT 7.4 04/25/2019 1000   ALBUMIN  2.7 (L) 02/21/2022 0142   ALBUMIN 4.2 04/25/2019 1000   AST 62 (H) 02/21/2022 0142   ALT 136 (H) 02/21/2022 0142   ALKPHOS 380 (H) 02/21/2022 0142   BILITOT 0.7 02/21/2022 0142   BILITOT 0.3 04/25/2019 1000   GFRNONAA 41 (L) 02/21/2022 0142   GFRAA 34 (L) 04/25/2019 1000   Lipase     Component Value Date/Time   LIPASE 62 (H) 02/21/2022 0142       Studies/Results: MR ABDOMEN MRCP W WO CONTAST  Result Date: 02/20/2022 CLINICAL DATA:  Cholelithiasis, evaluate for choledocholithiasis EXAM: MRI ABDOMEN WITHOUT AND WITH CONTRAST (INCLUDING MRCP) TECHNIQUE: Multiplanar multisequence MR imaging of the abdomen was performed both before and after the administration of intravenous contrast. Heavily T2-weighted images of the biliary and pancreatic ducts were obtained, and three-dimensional MRCP images were rendered by post processing. CONTRAST:  65m GADAVIST GADOBUTROL 1 MMOL/ML IV SOLN COMPARISON:  Right upper quadrant ultrasound dated 02/19/2022. CT abdomen/pelvis dated 10/02/2021. FINDINGS: Motion degraded images. Lower chest: Lung bases are clear. Hepatobiliary: Liver is unremarkable. Large gallstones, without associated inflammatory changes to suggest acute cholecystitis. No intrahepatic ductal dilatation. Dilated common duct, measuring 8 mm. Multiple mid/distal CBD stones measuring up to 6 mm (series 7/image 20). Pancreas: Grossly unremarkable in this patient with  history of acute pancreatitis. No peripancreatic fluid collection/pseudocyst. Spleen:  Within normal limits. Adrenals/Urinary Tract:  Adrenal glands are within normal limits. Bilateral renal cysts, including a dominant 6.1 cm right renal sinus cyst (series 8/image 23), benign (Bosniak I). No hydronephrosis. Stomach/Bowel: Stomach is notable for a small hiatal hernia. Visualized bowel is grossly unremarkable. Vascular/Lymphatic:  No evidence of abdominal aortic aneurysm. No suspicious abdominal lymphadenopathy. Other:  No abdominal ascites.  Musculoskeletal: Mild degenerative changes of the lumbar spine. IMPRESSION: Motion degraded images. Cholelithiasis, without associated inflammatory changes to suggest acute cholecystitis. Choledocholithiasis with multiple mid/distal CBD stones measuring up to 6 mm. ERCP is suggested. No peripancreatic fluid collection/pseudocyst in this patient with reported history of acute pancreatitis. Electronically Signed   By: Julian Hy M.D.   On: 02/20/2022 01:45   US Abdomen Limited RUQ (LIVER/GB)  Result Date: 02/19/2022 CLINICAL DATA:  Pancreatitis in a 81 year old male. Assess for ductal dilation. EXAM: ULTRASOUND ABDOMEN LIMITED RIGHT UPPER QUADRANT COMPARISON:  April 2023 FINDINGS: Gallbladder: The gallbladder is filled with gallstones. Wall thickness is difficult to evaluate given the presence of numerous gallstones. The anterior wall does not appear grossly thickened. The posterior wall cannot be seen due to shadowing. No pericholecystic fluid. Calculi as large as 2 cm or greater, individual stones are difficult to evaluate. No reported tenderness over the gallbladder. Common bile duct: Diameter: 10 mm Liver: Lobular hepatic contours. Signs of fissural widening. Echogenic liver. Portal vein is patent on color Doppler imaging with normal direction of blood flow towards the liver. Other: Renal cysts of the RIGHT kidney are better characterized on previous imaging. These are not fully evaluated on the current exam. Based on previous imaging no dedicated imaging follow-up of this area is recommended. IMPRESSION: Dilation of the common bile duct at 1 cm in the setting of acute pancreatitis based on clinical history. MRCP may be helpful for further evaluation to exclude choledocholithiasis in this patient with extensive cholelithiasis. Gallbladder not well assessed due to extensive cholelithiasis. No reported tenderness over the gallbladder at this time. Electronically Signed   By: Zetta Bills M.D.   On:  02/19/2022 15:12    Anti-infectives: Anti-infectives (From admission, onward)    Start     Dose/Rate Route Frequency Ordered Stop   02/20/22 0000  piperacillin-tazobactam (ZOSYN) IVPB 3.375 g        3.375 g 12.5 mL/hr over 240 Minutes Intravenous Every 8 hours 02/19/22 1833     02/19/22 1615  piperacillin-tazobactam (ZOSYN) IVPB 3.375 g        3.375 g 100 mL/hr over 30 Minutes Intravenous  Once 02/19/22 1601 02/19/22 1831        Assessment/Plan  Choledocholithiasis -MRCP with cholelithiasis and multiple mid to distal CBD stones.  Pancreas grossly unremarkable in setting of elevated lipase -Lipase only minimally elevated this am - 62 and no abdominal TTP in RUQ or epigastrium today -GI planning ERCP tentatively for Monday. LFTs and T. bili elevated on admission but improving.  T. bili normalized - afebrile and hemodynamically stable without evidence of GB inflammation on imaging and WBC normal but with some RUQ pain on admission. continue IV abx - Zosyn - tolerating CLD -Continue to hold Plavix -plan for lap chole as early as Tuesday.   FEN: FLD ZP:HXTAV VTE: plavix held (9/7 0900), hep subq   Per primary: CVA w/ left sided hemiplegia (2006, 07/2021) - on plavix HTN HLD Carotid artery stenosis s/p b/l CEA CKD stage IV Remote hx of bladder cancer s/p  TURP  GERD   I reviewed Consultant GI notes, hospitalist notes, last 24 h vitals and pain scores, last 48 h intake and output, last 24 h labs and trends, and last 24 h imaging results.    LOS: 2 days   [facility] 02/21/2022, 7:49 AM Please see Amion for pager number during day hours 7:00am-4:30pm

## 2022-02-21 NOTE — Progress Notes (Signed)
PROGRESS NOTE                                                                                                                                                                                                             Patient Demographics:    William [name], is a 81 y.o. male, DOB - 1940/07/20, POE:[id number]  Outpatient Primary MD for the patient is Ann Held, DO    LOS - 2  Admit date - 02/19/2022    Chief Complaint  Patient presents with   Abdominal Pain   Emesis   Nausea       Brief Narrative (HPI from H&P)   81 y.o. male with medical history significant of BPH; stage 3 CKD; h/o CVA; HTN; HLD; and mild vascular dementia presenting with abdominal pain.  He reports that he vomited this AM.  He had abdominal pain last night.  This AM, his emesis was dark with specks in it, wife thought bilious.  Him to the ER where work-up was consistent with acute cholecystitis, choledocholithiasis and he was admitted to the hospital.   Subjective:   Patient in bed, appears comfortable, denies any headache, no fever, no chest pain or pressure, no shortness of breath , no abdominal pain. No new focal weakness.   Assessment  & Plan :    Acute cholecystitis in the setting of choledocholithiasis. He is being treated with bowel rest, IV fluids, empiric IV antibiotics, MRCP noted, GI general surgery following.  He is on Plavix which is on hold since admission on 02/19/2022, GI will contemplate ERCP in the next few days followed with most likely cholecystectomy.  Currently no signs of sepsis symptoms have improved.  Continue to monitor with supportive care.  History of CVA with vascular dementia.  Plavix on hold due to #1 above, at risk for delirium, continue to monitor with supportive care.  Minimize narcotics and benzodiazepines.  Hypertension.  On Norvasc and as needed hydralazine.  BPH.  Continue Flomax.  Stage IIIb CKD.   Currently on baseline monitor.      Condition - Extremely Guarded  Family Communication  : wife [name] [phone] bedside on 02/20/22  Code Status : DNR  Consults  : GI, general surgery  PUD Prophylaxis : PPI   Procedures  :     MRCP -  Motion  degraded images. Cholelithiasis, without associated inflammatory changes to suggest acute cholecystitis. Choledocholithiasis with multiple mid/distal CBD stones measuring up to 6 mm. ERCP is suggested. No peripancreatic fluid collection/pseudocyst in this patient with reported history of acute pancreatitis.      Disposition Plan  :    Status is: Inpatient  DVT Prophylaxis  :  Heparin  SCDs Start: 02/19/22 1817    Lab Results  Component Value Date   PLT 154 02/21/2022    Diet :  Diet Order             Diet full liquid Room service appropriate? Yes; Fluid consistency: Thin  Diet effective now                    Inpatient Medications  Scheduled Meds:  amantadine  100 mg Oral Daily   amLODipine  10 mg Oral q morning   carBAMazepine  100 mg Oral BID   carBAMazepine  200 mg Oral QHS   influenza vaccine adjuvanted  0.5 mL Intramuscular Tomorrow-1000   LORazepam  0.25 mg Oral Daily   LORazepam  1 mg Oral QHS   pantoprazole  40 mg Oral Daily   tamsulosin  0.4 mg Oral QPC supper   traZODone  75 mg Oral QHS   Continuous Infusions:  lactated ringers 50 mL/hr at 02/21/22 0645   piperacillin-tazobactam (ZOSYN)  IV 3.375 g (02/21/22 0746)   PRN Meds:.acetaminophen **OR** acetaminophen, haloperidol lactate, hydrALAZINE, LORazepam, morphine injection, ondansetron **OR** ondansetron (ZOFRAN) IV  Antibiotics  :    Anti-infectives (From admission, onward)    Start     Dose/Rate Route Frequency Ordered Stop   02/20/22 0000  piperacillin-tazobactam (ZOSYN) IVPB 3.375 g        3.375 g 12.5 mL/hr over 240 Minutes Intravenous Every 8 hours 02/19/22 1833     02/19/22 1615  piperacillin-tazobactam (ZOSYN) IVPB 3.375 g         3.375 g 100 mL/hr over 30 Minutes Intravenous  Once 02/19/22 1601 02/19/22 1831        Time Spent in minutes  30   Lala Lund M.D on 02/21/2022 at 9:06 AM  To page go to [email or web address]   [facility] -  Office  [phone]  See all Orders from today for further details    Objective:   Vitals:   02/20/22 0808 02/20/22 1553 02/20/22 2122 02/21/22 0415  BP: (!) 145/79 131/61 (!) 139/58 (!) 140/53  Pulse: 71 64 63 64  Resp: '15 18 17 18  '$ Temp: 98.1 F (36.7 C) 97.7 F (36.5 C) 97.9 F (36.6 C) 98.1 F (36.7 C)  TempSrc: Oral Oral Oral Oral  SpO2: 94% 97% 97% 97%  Weight:      Height:        Wt Readings from Last 3 Encounters:  02/19/22 88.9 kg  01/04/22 87.6 kg  12/31/21 87.5 kg     Intake/Output Summary (Last 24 hours) at 02/21/2022 0906 Last data filed at 02/21/2022 0845 Gross per 24 hour  Intake 2969.95 ml  Output 100 ml  Net 2869.95 ml     Physical Exam  Awake Alert, No new F.N deficits, Normal affect [place].AT,PERRAL Supple Neck, No JVD,   Symmetrical Chest wall movement, Good air movement bilaterally, CTAB RRR,No Gallops, Rubs or new Murmurs,  +ve B.Sounds, Abd Soft, No tenderness,   No Cyanosis, Clubbing or edema       Data Review:    CBC Recent Labs  Lab 02/19/22 1118 02/20/22 0742  02/21/22 0142  WBC 7.8 5.4 5.5  HGB 11.8* 9.9* 9.3*  HCT 35.2* 29.4* 27.2*  PLT 209 161 154  MCV 97.8 98.0 96.8  MCH 32.8 33.0 33.1  MCHC 33.5 33.7 34.2  RDW 13.4 13.5 13.5  LYMPHSABS 0.7  --  2.0  MONOABS 0.8  --  0.6  EOSABS 0.0  --  0.2  BASOSABS 0.0  --  0.0    Electrolytes Recent Labs  Lab 02/19/22 1118 02/20/22 0742 02/21/22 0142  NA 138 139 139  K 4.3 3.9 3.8  CL 106 108 107  CO2 '26 23 25  '$ GLUCOSE 136* 103* 95  BUN 31* 26* 18  CREATININE 1.64* 1.64* 1.67*  CALCIUM 8.7* 8.7* 8.6*  AST 361* 125* 62*  ALT 265* 187* 136*  ALKPHOS 567* 417* 380*  BILITOT 3.2* 0.8 0.7  ALBUMIN 3.4* 2.8* 2.7*  MG  --  1.9 1.8  CRP  --  1.2*  1.4*  INR  --  1.1  --   BNP  --   --  38.3    ------------------------------------------------------------------------------------------------------------------ No results for input(s): "CHOL", "HDL", "LDLCALC", "TRIG", "CHOLHDL", "LDLDIRECT" in the last 72 hours.  Lab Results  Component Value Date   HGBA1C 5.3 07/27/2021    Micro Results No results found for this or any previous visit (from the past 240 hour(s)).  Radiology Reports MR ABDOMEN MRCP W WO CONTAST  Result Date: 02/20/2022 CLINICAL DATA:  Cholelithiasis, evaluate for choledocholithiasis EXAM: MRI ABDOMEN WITHOUT AND WITH CONTRAST (INCLUDING MRCP) TECHNIQUE: Multiplanar multisequence MR imaging of the abdomen was performed both before and after the administration of intravenous contrast. Heavily T2-weighted images of the biliary and pancreatic ducts were obtained, and three-dimensional MRCP images were rendered by post processing. CONTRAST:  64m GADAVIST GADOBUTROL 1 MMOL/ML IV SOLN COMPARISON:  Right upper quadrant ultrasound dated 02/19/2022. CT abdomen/pelvis dated 10/02/2021. FINDINGS: Motion degraded images. Lower chest: Lung bases are clear. Hepatobiliary: Liver is unremarkable. Large gallstones, without associated inflammatory changes to suggest acute cholecystitis. No intrahepatic ductal dilatation. Dilated common duct, measuring 8 mm. Multiple mid/distal CBD stones measuring up to 6 mm (series 7/image 20). Pancreas: Grossly unremarkable in this patient with history of acute pancreatitis. No peripancreatic fluid collection/pseudocyst. Spleen:  Within normal limits. Adrenals/Urinary Tract:  Adrenal glands are within normal limits. Bilateral renal cysts, including a dominant 6.1 cm right renal sinus cyst (series 8/image 23), benign (Bosniak I). No hydronephrosis. Stomach/Bowel: Stomach is notable for a small hiatal hernia. Visualized bowel is grossly unremarkable. Vascular/Lymphatic:  No evidence of abdominal aortic aneurysm. No  suspicious abdominal lymphadenopathy. Other:  No abdominal ascites. Musculoskeletal: Mild degenerative changes of the lumbar spine. IMPRESSION: Motion degraded images. Cholelithiasis, without associated inflammatory changes to suggest acute cholecystitis. Choledocholithiasis with multiple mid/distal CBD stones measuring up to 6 mm. ERCP is suggested. No peripancreatic fluid collection/pseudocyst in this patient with reported history of acute pancreatitis. Electronically Signed   By: SJulian HyM.D.   On: 02/20/2022 01:45   [place]Abdomen Limited RUQ (LIVER/GB)  Result Date: 02/19/2022 CLINICAL DATA:  Pancreatitis in a 81year old male. Assess for ductal dilation. EXAM: ULTRASOUND ABDOMEN LIMITED RIGHT UPPER QUADRANT COMPARISON:  April 2023 FINDINGS: Gallbladder: The gallbladder is filled with gallstones. Wall thickness is difficult to evaluate given the presence of numerous gallstones. The anterior wall does not appear grossly thickened. The posterior wall cannot be seen due to shadowing. No pericholecystic fluid. Calculi as large as 2 cm or greater, individual stones are difficult to evaluate. No reported  tenderness over the gallbladder. Common bile duct: Diameter: 10 mm Liver: Lobular hepatic contours. Signs of fissural widening. Echogenic liver. Portal vein is patent on color Doppler imaging with normal direction of blood flow towards the liver. Other: Renal cysts of the RIGHT kidney are better characterized on previous imaging. These are not fully evaluated on the current exam. Based on previous imaging no dedicated imaging follow-up of this area is recommended. IMPRESSION: Dilation of the common bile duct at 1 cm in the setting of acute pancreatitis based on clinical history. MRCP may be helpful for further evaluation to exclude choledocholithiasis in this patient with extensive cholelithiasis. Gallbladder not well assessed due to extensive cholelithiasis. No reported tenderness over the gallbladder at  this time. Electronically Signed   By: Zetta Bills M.D.   On: 02/19/2022 15:12

## 2022-02-22 ENCOUNTER — Inpatient Hospital Stay (HOSPITAL_COMMUNITY): Payer: PPO | Admitting: Anesthesiology

## 2022-02-22 ENCOUNTER — Ambulatory Visit: Payer: PPO | Admitting: Occupational Therapy

## 2022-02-22 ENCOUNTER — Encounter (HOSPITAL_COMMUNITY): Payer: Self-pay | Admitting: Internal Medicine

## 2022-02-22 ENCOUNTER — Inpatient Hospital Stay (HOSPITAL_COMMUNITY): Payer: PPO

## 2022-02-22 ENCOUNTER — Encounter (HOSPITAL_COMMUNITY)
Admission: EM | Disposition: A | Discharge status: Home / Self Care | Payer: Self-pay | Source: Home / Self Care | Attending: Internal Medicine

## 2022-02-22 ENCOUNTER — Ambulatory Visit: Payer: PPO | Admitting: Physical Therapy

## 2022-02-22 DIAGNOSIS — K838 Other specified diseases of biliary tract: Secondary | ICD-10-CM

## 2022-02-22 DIAGNOSIS — K8050 Calculus of bile duct without cholangitis or cholecystitis without obstruction: Secondary | ICD-10-CM | POA: Diagnosis not present

## 2022-02-22 DIAGNOSIS — Z87891 Personal history of nicotine dependence: Secondary | ICD-10-CM | POA: Diagnosis not present

## 2022-02-22 DIAGNOSIS — I10 Essential (primary) hypertension: Secondary | ICD-10-CM

## 2022-02-22 HISTORY — PX: SPHINCTEROTOMY: SHX5544

## 2022-02-22 HISTORY — PX: REMOVAL OF STONES: SHX5545

## 2022-02-22 HISTORY — PX: ENDOSCOPIC RETROGRADE CHOLANGIOPANCREATOGRAPHY (ERCP) WITH PROPOFOL: SHX5810

## 2022-02-22 LAB — CBC WITH DIFFERENTIAL/PLATELET
Abs Immature Granulocytes: 0.02 10*3/uL (ref 0.00–0.07)
Basophils Absolute: 0 10*3/uL (ref 0.0–0.1)
Basophils Relative: 1 %
Eosinophils Absolute: 0.2 10*3/uL (ref 0.0–0.5)
Eosinophils Relative: 4 %
HCT: 31.6 % — ABNORMAL LOW (ref 39.0–52.0)
Hemoglobin: 10.8 g/dL — ABNORMAL LOW (ref 13.0–17.0)
Immature Granulocytes: 0 %
Lymphocytes Relative: 28 %
Lymphs Abs: 1.4 10*3/uL (ref 0.7–4.0)
MCH: 32.9 pg (ref 26.0–34.0)
MCHC: 34.2 g/dL (ref 30.0–36.0)
MCV: 96.3 fL (ref 80.0–100.0)
Monocytes Absolute: 0.5 10*3/uL (ref 0.1–1.0)
Monocytes Relative: 10 %
Neutro Abs: 3 10*3/uL (ref 1.7–7.7)
Neutrophils Relative %: 57 %
Platelets: 172 10*3/uL (ref 150–400)
RBC: 3.28 MIL/uL — ABNORMAL LOW (ref 4.22–5.81)
RDW: 13.2 % (ref 11.5–15.5)
WBC: 5.2 10*3/uL (ref 4.0–10.5)
nRBC: 0 % (ref 0.0–0.2)

## 2022-02-22 LAB — COMPREHENSIVE METABOLIC PANEL
ALT: 101 U/L — ABNORMAL HIGH (ref 0–44)
AST: 34 U/L (ref 15–41)
Albumin: 3.1 g/dL — ABNORMAL LOW (ref 3.5–5.0)
Alkaline Phosphatase: 367 U/L — ABNORMAL HIGH (ref 38–126)
Anion gap: 8 (ref 5–15)
BUN: 12 mg/dL (ref 8–23)
CO2: 27 mmol/L (ref 22–32)
Calcium: 9 mg/dL (ref 8.9–10.3)
Chloride: 104 mmol/L (ref 98–111)
Creatinine, Ser: 1.65 mg/dL — ABNORMAL HIGH (ref 0.61–1.24)
GFR, Estimated: 42 mL/min — ABNORMAL LOW (ref >60)
Glucose, Bld: 109 mg/dL — ABNORMAL HIGH (ref 70–99)
Potassium: 3.5 mmol/L (ref 3.5–5.1)
Sodium: 139 mmol/L (ref 135–145)
Total Bilirubin: 0.6 mg/dL (ref 0.3–1.2)
Total Protein: 6.2 g/dL — ABNORMAL LOW (ref 6.5–8.1)

## 2022-02-22 LAB — BRAIN NATRIURETIC PEPTIDE: B Natriuretic Peptide: 61.6 pg/mL (ref 0.0–100.0)

## 2022-02-22 LAB — MAGNESIUM: Magnesium: 1.8 mg/dL (ref 1.7–2.4)

## 2022-02-22 LAB — C-REACTIVE PROTEIN: CRP: 0.7 mg/dL (ref <1.0)

## 2022-02-22 SURGERY — ENDOSCOPIC RETROGRADE CHOLANGIOPANCREATOGRAPHY (ERCP) WITH PROPOFOL
Anesthesia: General

## 2022-02-22 MED ORDER — DEXTROSE 5 % IV SOLN: INTRAVENOUS | Status: DC

## 2022-02-22 MED ORDER — ONDANSETRON HCL 4 MG/2ML IJ SOLN
INTRAMUSCULAR | Status: DC | PRN
  Administered 2022-02-22: 4 mg via INTRAVENOUS

## 2022-02-22 MED ORDER — INDOMETHACIN 50 MG RE SUPP: 100.0000 mg | Freq: Once | RECTAL | Status: DC

## 2022-02-22 MED ORDER — DEXAMETHASONE SODIUM PHOSPHATE 10 MG/ML IJ SOLN
INTRAMUSCULAR | Status: DC | PRN
  Administered 2022-02-22: 4 mg via INTRAVENOUS

## 2022-02-22 MED ORDER — DICLOFENAC SUPPOSITORY 100 MG
RECTAL | Status: AC
  Filled 2022-02-22: qty 1

## 2022-02-22 MED ORDER — DICLOFENAC SUPPOSITORY 100 MG
RECTAL | Status: DC | PRN
  Administered 2022-02-22: 100 mg via RECTAL

## 2022-02-22 MED ORDER — ROCURONIUM BROMIDE 10 MG/ML (PF) SYRINGE
PREFILLED_SYRINGE | INTRAVENOUS | Status: DC | PRN
  Administered 2022-02-22: 50 mg via INTRAVENOUS

## 2022-02-22 MED ORDER — SODIUM CHLORIDE 0.9 % IV SOLN
INTRAVENOUS | Status: DC | PRN
  Administered 2022-02-22: 20 mL

## 2022-02-22 MED ORDER — AMISULPRIDE (ANTIEMETIC) 5 MG/2ML IV SOLN: 10.0000 mg | Freq: Once | INTRAVENOUS | Status: DC | PRN

## 2022-02-22 MED ORDER — FENTANYL CITRATE (PF) 250 MCG/5ML IJ SOLN
INTRAMUSCULAR | Status: DC | PRN
  Administered 2022-02-22: 25 ug via INTRAVENOUS

## 2022-02-22 MED ORDER — SODIUM CHLORIDE 0.9 % IV SOLN: INTRAVENOUS | Status: DC

## 2022-02-22 MED ORDER — KCL IN DEXTROSE-NACL 20-5-0.45 MEQ/L-%-% IV SOLN
INTRAVENOUS | Status: DC
  Filled 2022-02-22 (×2): qty 1000

## 2022-02-22 MED ORDER — EPHEDRINE SULFATE-NACL 50-0.9 MG/10ML-% IV SOSY
PREFILLED_SYRINGE | INTRAVENOUS | Status: DC | PRN
  Administered 2022-02-22 (×2): 10 mg via INTRAVENOUS

## 2022-02-22 MED ORDER — LACTATED RINGERS IV SOLN: INTRAVENOUS | Status: DC | PRN

## 2022-02-22 MED ORDER — GLUCAGON HCL RDNA (DIAGNOSTIC) 1 MG IJ SOLR
INTRAMUSCULAR | Status: AC
  Filled 2022-02-22: qty 1

## 2022-02-22 MED ORDER — ONDANSETRON HCL 4 MG/2ML IJ SOLN: 4.0000 mg | Freq: Once | INTRAMUSCULAR | Status: DC | PRN

## 2022-02-22 MED ORDER — LIDOCAINE 2% (20 MG/ML) 5 ML SYRINGE
INTRAMUSCULAR | Status: DC | PRN
  Administered 2022-02-22: 40 mg via INTRAVENOUS

## 2022-02-22 MED ORDER — PROPOFOL 10 MG/ML IV BOLUS
INTRAVENOUS | Status: DC | PRN
  Administered 2022-02-22: 80 mg via INTRAVENOUS

## 2022-02-22 MED ORDER — SUGAMMADEX SODIUM 200 MG/2ML IV SOLN
INTRAVENOUS | Status: DC | PRN
  Administered 2022-02-22: 20 mg via INTRAVENOUS

## 2022-02-22 MED ORDER — INDOMETHACIN 50 MG RE SUPP
RECTAL | Status: AC
  Filled 2022-02-22: qty 2

## 2022-02-22 MED ORDER — FENTANYL CITRATE (PF) 100 MCG/2ML IJ SOLN
INTRAMUSCULAR | Status: AC
  Filled 2022-02-22: qty 2

## 2022-02-22 NOTE — Anesthesia Procedure Notes (Signed)
Procedure Name: Intubation Date/Time: 02/22/2022 12:09 PM  Performed by: Lowella Dell, CRNAPre-anesthesia Checklist: Patient identified, Emergency Drugs available, Suction available and Patient being monitored Patient Re-evaluated:Patient Re-evaluated prior to induction Oxygen Delivery Method: Circle System Utilized Preoxygenation: Pre-oxygenation with 100% oxygen Induction Type: IV induction Ventilation: Mask ventilation without difficulty and Oral airway inserted - appropriate to patient size Laryngoscope Size: Mac and 4 Grade View: Grade I Tube type: Oral Tube size: 7.5 mm Number of attempts: 1 Airway Equipment and Method: Stylet Placement Confirmation: ETT inserted through vocal cords under direct vision, positive ETCO2 and breath sounds checked- equal and bilateral Secured at: 22 cm Tube secured with: Tape Dental Injury: Teeth and Oropharynx as per pre-operative assessment

## 2022-02-22 NOTE — Anesthesia Postprocedure Evaluation (Signed)
Anesthesia Post Note  Patient: William [name]  Procedure(s) Performed: ENDOSCOPIC RETROGRADE CHOLANGIOPANCREATOGRAPHY (ERCP) WITH PROPOFOL SPHINCTEROTOMY REMOVAL OF STONES     Patient location during evaluation: PACU Anesthesia Type: General Level of consciousness: awake and alert Pain management: pain level controlled Vital Signs Assessment: post-procedure vital signs reviewed and stable Respiratory status: spontaneous breathing, nonlabored ventilation, respiratory function stable and patient connected to nasal cannula oxygen Cardiovascular status: blood pressure returned to baseline and stable Postop Assessment: no apparent nausea or vomiting Anesthetic complications: no   No notable events documented.  Last Vitals:  Vitals:   02/22/22 1315 02/22/22 1330  BP: 127/61 124/65  Pulse: 74 74  Resp: (!) 22 (!) 24  Temp: (!) 36.3 C   SpO2: 98% 94%    Last Pain:  Vitals:   02/22/22 1315  TempSrc:   PainSc: 0-No pain                 Effie Berkshire

## 2022-02-22 NOTE — Transfer of Care (Signed)
Immediate Anesthesia Transfer of Care Note  Patient: William [name]  Procedure(s) Performed: ENDOSCOPIC RETROGRADE CHOLANGIOPANCREATOGRAPHY (ERCP) WITH PROPOFOL SPHINCTEROTOMY REMOVAL OF STONES  Patient Location: PACU  Anesthesia Type:General  Level of Consciousness: awake and patient cooperative  Airway & Oxygen Therapy: Patient Spontanous Breathing  Post-op Assessment: Report given to RN and Post -op Vital signs reviewed and stable  Post vital signs: Reviewed and stable  Last Vitals:  Vitals Value Taken Time  BP 127/61 02/22/22 1315  Temp    Pulse 74 02/22/22 1316  Resp 22 02/22/22 1316  SpO2 97 % 02/22/22 1316  Vitals shown include unvalidated device data.  Last Pain:  Vitals:   02/22/22 1105  TempSrc: Oral  PainSc: 0-No pain         Complications: No notable events documented.

## 2022-02-22 NOTE — Op Note (Signed)
[facility] Patient Name: William [name] Procedure Date : 02/22/2022 MRN: [id number] Attending MD: Carol Ada , MD Date of Birth: 1941-05-07 CSN: [id number] Age: 81 Admit Type: Inpatient Procedure:                ERCP Indications:              Common bile duct stone(s) Providers:                Carol Ada, MD, Dulcy Fanny, Cherylynn Ridges,                            Technician, [facility], Technician, Cira Servant,                            CRNA Referring MD:              Medicines:                General Anesthesia Complications:            No immediate complications. Estimated Blood Loss:     Estimated blood loss: none. Procedure:                Pre-Anesthesia Assessment:                           - Prior to the procedure, a History and Physical                            was performed, and patient medications and                            allergies were reviewed. The patient's tolerance of                            previous anesthesia was also reviewed. The risks                            and benefits of the procedure and the sedation                            options and risks were discussed with the patient.                            All questions were answered, and informed consent                            was obtained. Prior Anticoagulants: The patient has                            taken Plavix (clopidogrel), last dose was 4 days                            prior to procedure. ASA Grade Assessment: III - A                            patient  with severe systemic disease. After                            reviewing the risks and benefits, the patient was                            deemed in satisfactory condition to undergo the                            procedure.                           - Sedation was administered by an anesthesia                            professional. Deep sedation was attained.                           After obtaining informed  consent, the scope was                            passed under direct vision. Throughout the                            procedure, the patient's blood pressure, pulse, and                            oxygen saturations were monitored continuously. The                            [id number]-[id number] ([id number] Olympus duodenoscope was                            introduced through the mouth, and used to inject                            contrast into and used to inject contrast into the                            bile duct. The ERCP was technically difficult and                            complex. The patient tolerated the procedure well. Scope In: Scope Out: Findings:      The major papilla was normal. The bile duct was deeply cannulated with       the short-nosed traction sphincterotome. Contrast was injected. I       personally interpreted the bile duct images. There was brisk flow of       contrast through the ducts. Image quality was excellent. Contrast       extended to the cystic duct. Contrast extended to the bifurcation. The       common bile duct contained three stones, the largest of which was 10 mm       in diameter. The common bile duct was diffusely dilated, acquired. The  largest diameter was 10m. A long 0.035 inch Soft Jagwire was passed       into the biliary tree. A 10 mm biliary sphincterotomy was made with a       traction (standard) sphincterotome using ERBE electrocautery. There was       no post-sphincterotomy bleeding. The biliary tree was swept with a 12 mm       balloon starting at the bifurcation. Three stones were removed. No       stones remained.      The stomach was very compliant and the duodenoscope looped in the the       lumen. Abdominal pressure and placing the patient in a semi left lateral       decubitus position allowed for the endoscope to slowly traverse pylorus       and ultimately into the second portion of the duodenum. Excellent       placement was  obtained and the CBD was cannulated during the first       attempt. The guidewire was secured in the left intrahepatic ducts.       Contrast injection revealed several distal CBD stones. A 1 cm       sphincterotomy was created and there was free flow of clear bile. The       CBD was swept and the first pass produced three stones. Subsequent       passes were negative for stones. A final occlusion cholangiogram was       performed during the last sweep and there was no evidence of any       retained stones. Fluoroscopic impages were obtained. Impression:               - The major papilla appeared normal.                           - The common bile duct was dilated, acquired.                           - Choledocholithiasis was found. Complete removal                            was accomplished by biliary sphincterotomy and                            balloon extraction.                           - A biliary sphincterotomy was performed.                           - The biliary tree was swept. Recommendation:           - Return patient to hospital ward for ongoing care.                           - Resume regular diet.                           - NPO after midnight for possible lap chole. Procedure Code(s):        --- Professional ---  [phone], Endoscopic retrograde                            cholangiopancreatography (ERCP); with removal of                            calculi/debris from biliary/pancreatic duct(s)                           [id number], Endoscopic retrograde                            cholangiopancreatography (ERCP); with                            sphincterotomy/papillotomy                           [phone], Endoscopic catheterization of the biliary                            ductal system, radiological supervision and                            interpretation Diagnosis Code(s):        --- Professional ---                           K80.50, Calculus of bile duct  without cholangitis                            or cholecystitis without obstruction                           K83.8, Other specified diseases of biliary tract CPT copyright 2019 [employer] Association. All rights reserved. The codes documented in this report are preliminary and upon coder review may  be revised to meet current compliance requirements. Carol Ada, MD Carol Ada, MD 02/22/2022 1:09:13 PM This report has been signed electronically. Number of Addenda: 0

## 2022-02-22 NOTE — Progress Notes (Signed)
OT Cancellation Note  Patient Details Name: ORBIE GRUPE MRN: [id number] DOB: 1940/06/28   Cancelled Treatment:    Reason Eval/Treat Not Completed: Patient at procedure or test/ unavailable (Will return as schedule allows)  Shanda Howells, OTR/L [facility] Office: [phone]   Lula Olszewski 02/22/2022, 1:28 PM

## 2022-02-22 NOTE — Anesthesia Preprocedure Evaluation (Addendum)
Anesthesia Evaluation  Patient identified by MRN, date of birth, ID band Patient awake    Reviewed: Allergy & Precautions, NPO status , Patient's Chart, lab work & pertinent test results  Airway Mallampati: II  TM Distance: >3 FB Neck ROM: Full    Dental  (+) Poor Dentition, Dental Advisory Given   Pulmonary former smoker,    breath sounds clear to auscultation       Cardiovascular hypertension,  Rhythm:Regular Rate:Normal     Neuro/Psych Anxiety CVA    GI/Hepatic Neg liver ROS, GERD  ,  Endo/Other  negative endocrine ROS  Renal/GU Renal disease     Musculoskeletal  (+) Arthritis ,   Abdominal Normal abdominal exam  (+)   Peds  Hematology negative hematology ROS (+)   Anesthesia Other Findings   Reproductive/Obstetrics                            Anesthesia Physical Anesthesia Plan  ASA: 3  Anesthesia Plan: General   Post-op Pain Management:    Induction: Intravenous  PONV Risk Score and Plan: 3 and Ondansetron and Treatment may vary due to age or medical condition  Airway Management Planned: Oral ETT  Additional Equipment: None  Intra-op Plan:   Post-operative Plan: Extubation in OR  Informed Consent: I have reviewed the patients History and Physical, chart, labs and discussed the procedure including the risks, benefits and alternatives for the proposed anesthesia with the patient or authorized representative who has indicated his/her understanding and acceptance.   Patient has DNR.  Discussed DNR with patient and Suspend DNR.   Dental advisory given  Plan Discussed with: CRNA  Anesthesia Plan Comments:        Anesthesia Quick Evaluation

## 2022-02-22 NOTE — Progress Notes (Addendum)
PROGRESS NOTE                                                                                                                                                                                                             Patient Demographics:    William [name], is a 81 y.o. male, DOB - [birth year], JJH:[id number]  Outpatient Primary MD for the patient is Ann Held, DO    LOS - 3  Admit date - 02/19/2022    Chief Complaint  Patient presents with   Abdominal Pain   Emesis   Nausea       Brief Narrative (HPI from H&P)   81 y.o. male with medical history significant of BPH; stage 3 CKD; h/o CVA; HTN; HLD; and mild vascular dementia presenting with abdominal pain.  He reports that he vomited this AM.  He had abdominal pain last night.  This AM, his emesis was dark with specks in it, wife thought bilious.  Him to the ER where work-up was consistent with acute cholecystitis, choledocholithiasis and he was admitted to the hospital.   Subjective:   Patient in bed, appears comfortable, denies any headache, no fever, no chest pain or pressure, no shortness of breath , no abdominal pain. No new focal weakness.   Assessment  & Plan :    Although patient is DNR during surgical procedures okay to keep him full code.     Acute cholecystitis in the setting of choledocholithiasis. He is being treated with bowel rest, IV fluids, empiric IV antibiotics, MRCP noted, GI general surgery following.  He is on Plavix which is on hold since admission on 02/19/2022, due for ERCP by GI on 02/22/2022 followed with most likely cholecystectomy.  Currently no signs of sepsis symptoms have improved.  Continue to monitor with supportive care.  History of CVA with vascular dementia.  Plavix on hold due to #1 above, at risk for delirium, continue to monitor with supportive care.  Minimize narcotics and benzodiazepines.  Hypertension.  On Norvasc and as  needed hydralazine.  BPH.  Continue Flomax.  Stage IIIb CKD.  Currently on baseline monitor.      Condition - Extremely Guarded  Family Communication  : wife [name] [phone] bedside on 02/20/22, 02/22/2022  Code Status : DNR, full code during the surgical procedures.  Consults  :  GI, general surgery  PUD Prophylaxis : PPI   Procedures  :     MRCP -  Motion degraded images. Cholelithiasis, without associated inflammatory changes to suggest acute cholecystitis. Choledocholithiasis with multiple mid/distal CBD stones measuring up to 6 mm. ERCP is suggested. No peripancreatic fluid collection/pseudocyst in this patient with reported history of acute pancreatitis.      Disposition Plan  :    Status is: Inpatient  DVT Prophylaxis  :  Heparin  heparin injection 5,000 Units Start: 02/22/22 2200 heparin injection 5,000 Units Start: 02/21/22 1400 SCDs Start: 02/19/22 1817    Lab Results  Component Value Date   PLT 172 02/22/2022    Diet :  Diet Order             Diet NPO time specified Except for: Sips with Meds  Diet effective midnight           Diet NPO time specified Except for: Sips with Meds  Diet effective midnight                    Inpatient Medications  Scheduled Meds:  amantadine  100 mg Oral Daily   amLODipine  10 mg Oral q morning   carBAMazepine  100 mg Oral BID   carBAMazepine  200 mg Oral QHS   heparin injection (subcutaneous)  5,000 Units Subcutaneous Q8H   influenza vaccine adjuvanted  0.5 mL Intramuscular Tomorrow-1000   LORazepam  0.25 mg Oral Daily   LORazepam  1 mg Oral QHS   pantoprazole  40 mg Oral Daily   tamsulosin  0.4 mg Oral QPC supper   traZODone  75 mg Oral QHS   Continuous Infusions:  dextrose 50 mL/hr at 02/22/22 0635   piperacillin-tazobactam (ZOSYN)  IV 3.375 g (02/22/22 0902)   PRN Meds:.acetaminophen **OR** acetaminophen, haloperidol lactate, hydrALAZINE, LORazepam, morphine injection, ondansetron **OR**  ondansetron (ZOFRAN) IV  Antibiotics  :    Anti-infectives (From admission, onward)    Start     Dose/Rate Route Frequency Ordered Stop   02/20/22 0000  piperacillin-tazobactam (ZOSYN) IVPB 3.375 g        3.375 g 12.5 mL/hr over 240 Minutes Intravenous Every 8 hours 02/19/22 1833     02/19/22 1615  piperacillin-tazobactam (ZOSYN) IVPB 3.375 g        3.375 g 100 mL/hr over 30 Minutes Intravenous  Once 02/19/22 1601 02/19/22 1831        Time Spent in minutes  30   Lala Lund M.D on 02/22/2022 at 10:59 AM  To page go to [email or web address]   [facility] -  Office  [phone]  See all Orders from today for further details    Objective:   Vitals:   02/21/22 1623 02/21/22 2009 02/22/22 0439 02/22/22 0810  BP: 137/60 (!) 143/61 (!) 155/69 (!) 155/71  Pulse: 64 68 67 64  Resp: '18 16  17  '$ Temp: 97.7 F (36.5 C) 98.1 F (36.7 C) 98.5 F (36.9 C) 97.6 F (36.4 C)  TempSrc: Oral Oral Oral Oral  SpO2: 96% 96% 96% 96%  Weight:      Height:        Wt Readings from Last 3 Encounters:  02/19/22 88.9 kg  01/04/22 87.6 kg  12/31/21 87.5 kg     Intake/Output Summary (Last 24 hours) at 02/22/2022 1059 Last data filed at 02/22/2022 0800 Gross per 24 hour  Intake 795.2 ml  Output 1851 ml  Net -1055.8 ml     Physical  Exam  Awake Alert, No new F.N deficits, Normal affect .AT,PERRAL Supple Neck, No JVD,   Symmetrical Chest wall movement, Good air movement bilaterally, CTAB RRR,No Gallops, Rubs or new Murmurs,  +ve B.Sounds, Abd Soft, No tenderness,   No Cyanosis, Clubbing or edema       Data Review:    CBC Recent Labs  Lab 02/19/22 1118 02/20/22 0742 02/21/22 0142 02/22/22 0835  WBC 7.8 5.4 5.5 5.2  HGB 11.8* 9.9* 9.3* 10.8*  HCT 35.2* 29.4* 27.2* 31.6*  PLT 209 161 154 172  MCV 97.8 98.0 96.8 96.3  MCH 32.8 33.0 33.1 32.9  MCHC 33.5 33.7 34.2 34.2  RDW 13.4 13.5 13.5 13.2  LYMPHSABS 0.7  --  2.0 1.4  MONOABS 0.8  --  0.6 0.5  EOSABS 0.0  --   0.2 0.2  BASOSABS 0.0  --  0.0 0.0    Electrolytes Recent Labs  Lab 02/19/22 1118 02/20/22 0742 02/21/22 0142 02/22/22 0835  NA 138 139 139 139  K 4.3 3.9 3.8 3.5  CL 106 108 107 104  CO2 '26 23 25 27  '$ GLUCOSE 136* 103* 95 109*  BUN 31* 26* 18 12  CREATININE 1.64* 1.64* 1.67* 1.65*  CALCIUM 8.7* 8.7* 8.6* 9.0  AST 361* 125* 62* 34  ALT 265* 187* 136* 101*  ALKPHOS 567* 417* 380* 367*  BILITOT 3.2* 0.8 0.7 0.6  ALBUMIN 3.4* 2.8* 2.7* 3.1*  MG  --  1.9 1.8 1.8  CRP  --  1.2* 1.4* 0.7  INR  --  1.1  --   --   BNP  --   --  38.3 61.6    ------------------------------------------------------------------------------------------------------------------ No results for input(s): "CHOL", "HDL", "LDLCALC", "TRIG", "CHOLHDL", "LDLDIRECT" in the last 72 hours.  Lab Results  Component Value Date   HGBA1C 5.3 07/27/2021    Micro Results No results found for this or any previous visit (from the past 240 hour(s)).  Radiology Reports MR ABDOMEN MRCP W WO CONTAST  Result Date: 02/20/2022 CLINICAL DATA:  Cholelithiasis, evaluate for choledocholithiasis EXAM: MRI ABDOMEN WITHOUT AND WITH CONTRAST (INCLUDING MRCP) TECHNIQUE: Multiplanar multisequence MR imaging of the abdomen was performed both before and after the administration of intravenous contrast. Heavily T2-weighted images of the biliary and pancreatic ducts were obtained, and three-dimensional MRCP images were rendered by post processing. CONTRAST:  56m GADAVIST GADOBUTROL 1 MMOL/ML IV SOLN COMPARISON:  Right upper quadrant ultrasound dated 02/19/2022. CT abdomen/pelvis dated 10/02/2021. FINDINGS: Motion degraded images. Lower chest: Lung bases are clear. Hepatobiliary: Liver is unremarkable. Large gallstones, without associated inflammatory changes to suggest acute cholecystitis. No intrahepatic ductal dilatation. Dilated common duct, measuring 8 mm. Multiple mid/distal CBD stones measuring up to 6 mm (series 7/image 20). Pancreas:  Grossly unremarkable in this patient with history of acute pancreatitis. No peripancreatic fluid collection/pseudocyst. Spleen:  Within normal limits. Adrenals/Urinary Tract:  Adrenal glands are within normal limits. Bilateral renal cysts, including a dominant 6.1 cm right renal sinus cyst (series 8/image 23), benign (Bosniak I). No hydronephrosis. Stomach/Bowel: Stomach is notable for a small hiatal hernia. Visualized bowel is grossly unremarkable. Vascular/Lymphatic:  No evidence of abdominal aortic aneurysm. No suspicious abdominal lymphadenopathy. Other:  No abdominal ascites. Musculoskeletal: Mild degenerative changes of the lumbar spine. IMPRESSION: Motion degraded images. Cholelithiasis, without associated inflammatory changes to suggest acute cholecystitis. Choledocholithiasis with multiple mid/distal CBD stones measuring up to 6 mm. ERCP is suggested. No peripancreatic fluid collection/pseudocyst in this patient with reported history of acute pancreatitis. Electronically Signed  By: Julian Hy M.D.   On: 02/20/2022 01:45   US Abdomen Limited RUQ (LIVER/GB)  Result Date: 02/19/2022 CLINICAL DATA:  Pancreatitis in a 81 year old male. Assess for ductal dilation. EXAM: ULTRASOUND ABDOMEN LIMITED RIGHT UPPER QUADRANT COMPARISON:  April 2023 FINDINGS: Gallbladder: The gallbladder is filled with gallstones. Wall thickness is difficult to evaluate given the presence of numerous gallstones. The anterior wall does not appear grossly thickened. The posterior wall cannot be seen due to shadowing. No pericholecystic fluid. Calculi as large as 2 cm or greater, individual stones are difficult to evaluate. No reported tenderness over the gallbladder. Common bile duct: Diameter: 10 mm Liver: Lobular hepatic contours. Signs of fissural widening. Echogenic liver. Portal vein is patent on color Doppler imaging with normal direction of blood flow towards the liver. Other: Renal cysts of the RIGHT kidney are better  characterized on previous imaging. These are not fully evaluated on the current exam. Based on previous imaging no dedicated imaging follow-up of this area is recommended. IMPRESSION: Dilation of the common bile duct at 1 cm in the setting of acute pancreatitis based on clinical history. MRCP may be helpful for further evaluation to exclude choledocholithiasis in this patient with extensive cholelithiasis. Gallbladder not well assessed due to extensive cholelithiasis. No reported tenderness over the gallbladder at this time. Electronically Signed   By: Zetta Bills M.D.   On: 02/19/2022 15:12

## 2022-02-22 NOTE — Interval H&P Note (Signed)
History and Physical Interval Note:  02/22/2022 11:57 AM  William [name]  has presented today for surgery, with the diagnosis of Choledocholithiasis.  The various methods of treatment have been discussed with the patient and family. After consideration of risks, benefits and other options for treatment, the patient has consented to  Procedure(s): ENDOSCOPIC RETROGRADE CHOLANGIOPANCREATOGRAPHY (ERCP) WITH PROPOFOL (N/A) as a surgical intervention.  The patient's history has been reviewed, patient examined, no change in status, stable for surgery.  I have reviewed the patient's chart and labs.  Questions were answered to the patient's satisfaction.     Susie Ehresman D

## 2022-02-22 NOTE — Progress Notes (Signed)
PT Cancellation Note  Patient Details Name: William [name] MRN: [id number] DOB: Jul 12, 1940   Cancelled Treatment:    Reason Eval/Treat Not Completed: Other (comment) Followed up with pt this afternoon , after ERCP, and he reports not feeling like therapy at this time.  Pt to have lap chole in morning per family and RN.  Will f/u as able. Abran Richard, PT Acute Rehab  [facility] 02/22/2022, 5:35 PM

## 2022-02-22 NOTE — Progress Notes (Signed)
PT Cancellation Note  Patient Details Name: William [name] MRN: [id number] DOB: July 15, 1940   Cancelled Treatment:    Reason Eval/Treat Not Completed: (P) Patient at procedure or test/unavailable (pt at Endoscopy dept for procedure.) Will continue efforts per PT plan of care as schedule permits.   Kara Pacer Holly Iannaccone 02/22/2022, 12:18 PM

## 2022-02-22 NOTE — Progress Notes (Addendum)
Progress Note     Subjective: Had some mild abdominal pain in RLQ yesterday and early this morning. No nausea or emesis on FLD. Now NPO for ERCP.  Wife is bedside  Objective: Vital signs in last 24 hours: Temp:  [97.5 F (36.4 C)-98.5 F (36.9 C)] 97.5 F (36.4 C) (09/11 0810) Pulse Rate:  [64-68] 64 (09/11 0810) Resp:  [16-18] 17 (09/11 0810) BP: (137-155)/(60-71) 155/71 (09/11 0810) SpO2:  [96 %-98 %] 96 % (09/11 0810) Last BM Date : 02/20/22  Intake/Output from previous day: 09/10 0701 - 09/11 0700 In: 1275.2 [P.O.:720; I.V.:425.7; IV Piggyback:129.6] Out: 1201 [Urine:1201] Intake/Output this shift: No intake/output data recorded.  PE: General: pleasant, WD, male who is laying in bed in NAD Lungs: Respiratory effort nonlabored Abd: soft, ND, +BS. Very mild RLQ pain without rebound or guarding. No RUQ or epigastric pain to palpation MSK: all 4 extremities are symmetrical with no cyanosis, clubbing, or edema. Skin: warm and dry Psych: A&Ox3 with an appropriate affect.    Lab Results:  Recent Labs    02/20/22 0742 02/21/22 0142  WBC 5.4 5.5  HGB 9.9* 9.3*  HCT 29.4* 27.2*  PLT 161 154    BMET Recent Labs    02/20/22 0742 02/21/22 0142  NA 139 139  K 3.9 3.8  CL 108 107  CO2 23 25  GLUCOSE 103* 95  BUN 26* 18  CREATININE 1.64* 1.67*  CALCIUM 8.7* 8.6*    PT/INR Recent Labs    02/20/22 0742  LABPROT 14.4  INR 1.1    CMP     Component Value Date/Time   NA 139 02/21/2022 0142   NA 142 04/25/2019 1000   K 3.8 02/21/2022 0142   CL 107 02/21/2022 0142   CO2 25 02/21/2022 0142   GLUCOSE 95 02/21/2022 0142   BUN 18 02/21/2022 0142   BUN 30 (H) 04/25/2019 1000   CREATININE 1.67 (H) 02/21/2022 0142   CREATININE 1.71 (H) 12/25/2021 1433   CALCIUM 8.6 (L) 02/21/2022 0142   PROT 5.6 (L) 02/21/2022 0142   PROT 7.4 04/25/2019 1000   ALBUMIN 2.7 (L) 02/21/2022 0142   ALBUMIN 4.2 04/25/2019 1000   AST 62 (H) 02/21/2022 0142   ALT 136 (H)  02/21/2022 0142   ALKPHOS 380 (H) 02/21/2022 0142   BILITOT 0.7 02/21/2022 0142   BILITOT 0.3 04/25/2019 1000   GFRNONAA 41 (L) 02/21/2022 0142   GFRAA 34 (L) 04/25/2019 1000   Lipase     Component Value Date/Time   LIPASE 62 (H) 02/21/2022 0142       Studies/Results: No results found.  Anti-infectives: Anti-infectives (From admission, onward)    Start     Dose/Rate Route Frequency Ordered Stop   02/20/22 0000  piperacillin-tazobactam (ZOSYN) IVPB 3.375 g        3.375 g 12.5 mL/hr over 240 Minutes Intravenous Every 8 hours 02/19/22 1833     02/19/22 1615  piperacillin-tazobactam (ZOSYN) IVPB 3.375 g        3.375 g 100 mL/hr over 30 Minutes Intravenous  Once 02/19/22 1601 02/19/22 1831        Assessment/Plan  Choledocholithiasis -MRCP with cholelithiasis and multiple mid to distal CBD stones.  Pancreas grossly unremarkable in setting of elevated lipase -Lipase only minimally elevated at 62 9/10 and no abdominal TTP in RUQ or epigastrium today -ERCP today. LFTs and T. bili elevated on admission but improving.  T. bili normalized - afebrile and hemodynamically stable without evidence of GB inflammation  on imaging and WBC normal but with some RUQ pain on admission. continue IV abx - Zosyn - tolerated FLD. NPO for ERCP. Can resume FLD after ERCP and NPO MN -Continue to hold Plavix -plan for lap chole as early as Tuesday.   FEN: NPO for ERCP. 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    LOS: 3 days   [facility] 02/22/2022, 8:12 AM Please see Amion for pager  number during day hours 7:00am-4:30pm

## 2022-02-23 ENCOUNTER — Encounter (HOSPITAL_COMMUNITY): Payer: Self-pay | Admitting: Internal Medicine

## 2022-02-23 ENCOUNTER — Inpatient Hospital Stay (HOSPITAL_COMMUNITY): Payer: PPO | Admitting: Anesthesiology

## 2022-02-23 ENCOUNTER — Encounter (HOSPITAL_COMMUNITY)
Admission: EM | Disposition: A | Discharge status: Home / Self Care | Payer: Self-pay | Source: Home / Self Care | Attending: Internal Medicine

## 2022-02-23 ENCOUNTER — Other Ambulatory Visit: Payer: Self-pay

## 2022-02-23 DIAGNOSIS — K819 Cholecystitis, unspecified: Secondary | ICD-10-CM | POA: Diagnosis not present

## 2022-02-23 DIAGNOSIS — D649 Anemia, unspecified: Secondary | ICD-10-CM

## 2022-02-23 DIAGNOSIS — I10 Essential (primary) hypertension: Secondary | ICD-10-CM

## 2022-02-23 DIAGNOSIS — Z87891 Personal history of nicotine dependence: Secondary | ICD-10-CM

## 2022-02-23 DIAGNOSIS — K8050 Calculus of bile duct without cholangitis or cholecystitis without obstruction: Secondary | ICD-10-CM | POA: Diagnosis not present

## 2022-02-23 HISTORY — PX: CHOLECYSTECTOMY: SHX55

## 2022-02-23 LAB — CBC WITH DIFFERENTIAL/PLATELET
Abs Immature Granulocytes: 0.02 10*3/uL (ref 0.00–0.07)
Basophils Absolute: 0 10*3/uL (ref 0.0–0.1)
Basophils Relative: 0 %
Eosinophils Absolute: 0.1 10*3/uL (ref 0.0–0.5)
Eosinophils Relative: 1 %
HCT: 29.6 % — ABNORMAL LOW (ref 39.0–52.0)
Hemoglobin: 9.7 g/dL — ABNORMAL LOW (ref 13.0–17.0)
Immature Granulocytes: 0 %
Lymphocytes Relative: 22 %
Lymphs Abs: 1.6 10*3/uL (ref 0.7–4.0)
MCH: 32 pg (ref 26.0–34.0)
MCHC: 32.8 g/dL (ref 30.0–36.0)
MCV: 97.7 fL (ref 80.0–100.0)
Monocytes Absolute: 0.7 10*3/uL (ref 0.1–1.0)
Monocytes Relative: 10 %
Neutro Abs: 4.7 10*3/uL (ref 1.7–7.7)
Neutrophils Relative %: 67 %
Platelets: 175 10*3/uL (ref 150–400)
RBC: 3.03 MIL/uL — ABNORMAL LOW (ref 4.22–5.81)
RDW: 13.2 % (ref 11.5–15.5)
WBC: 7 10*3/uL (ref 4.0–10.5)
nRBC: 0 % (ref 0.0–0.2)

## 2022-02-23 LAB — COMPREHENSIVE METABOLIC PANEL
ALT: 76 U/L — ABNORMAL HIGH (ref 0–44)
AST: 25 U/L (ref 15–41)
Albumin: 2.8 g/dL — ABNORMAL LOW (ref 3.5–5.0)
Alkaline Phosphatase: 295 U/L — ABNORMAL HIGH (ref 38–126)
Anion gap: 7 (ref 5–15)
BUN: 19 mg/dL (ref 8–23)
CO2: 26 mmol/L (ref 22–32)
Calcium: 8.7 mg/dL — ABNORMAL LOW (ref 8.9–10.3)
Chloride: 105 mmol/L (ref 98–111)
Creatinine, Ser: 2.52 mg/dL — ABNORMAL HIGH (ref 0.61–1.24)
GFR, Estimated: 25 mL/min — ABNORMAL LOW (ref >60)
Glucose, Bld: 128 mg/dL — ABNORMAL HIGH (ref 70–99)
Potassium: 3.6 mmol/L (ref 3.5–5.1)
Sodium: 138 mmol/L (ref 135–145)
Total Bilirubin: 0.7 mg/dL (ref 0.3–1.2)
Total Protein: 5.8 g/dL — ABNORMAL LOW (ref 6.5–8.1)

## 2022-02-23 LAB — C-REACTIVE PROTEIN: CRP: 0.7 mg/dL (ref <1.0)

## 2022-02-23 LAB — MAGNESIUM: Magnesium: 1.8 mg/dL (ref 1.7–2.4)

## 2022-02-23 LAB — BRAIN NATRIURETIC PEPTIDE: B Natriuretic Peptide: 49 pg/mL (ref 0.0–100.0)

## 2022-02-23 SURGERY — LAPAROSCOPIC CHOLECYSTECTOMY
Anesthesia: General | Site: Abdomen

## 2022-02-23 MED ORDER — PHENYLEPHRINE 80 MCG/ML (10ML) SYRINGE FOR IV PUSH (FOR BLOOD PRESSURE SUPPORT)
PREFILLED_SYRINGE | INTRAVENOUS | Status: AC
  Filled 2022-02-23: qty 10

## 2022-02-23 MED ORDER — SODIUM CHLORIDE 0.9 % IR SOLN
Status: DC | PRN
  Administered 2022-02-23: 1000 mL

## 2022-02-23 MED ORDER — MAGNESIUM SULFATE 2 GM/50ML IV SOLN
2.0000 g | Freq: Once | INTRAVENOUS | Status: AC
  Administered 2022-02-23: 2 g via INTRAVENOUS
  Filled 2022-02-23: qty 50

## 2022-02-23 MED ORDER — ORAL CARE MOUTH RINSE: 15.0000 mL | Freq: Once | OROMUCOSAL | Status: AC

## 2022-02-23 MED ORDER — BUPIVACAINE HCL 0.25 % IJ SOLN
INTRAMUSCULAR | Status: DC | PRN
  Administered 2022-02-23: 30 mL

## 2022-02-23 MED ORDER — PHENYLEPHRINE 80 MCG/ML (10ML) SYRINGE FOR IV PUSH (FOR BLOOD PRESSURE SUPPORT)
PREFILLED_SYRINGE | INTRAVENOUS | Status: DC | PRN
  Administered 2022-02-23: 160 ug via INTRAVENOUS
  Administered 2022-02-23: 80 ug via INTRAVENOUS

## 2022-02-23 MED ORDER — TRAMADOL HCL 50 MG PO TABS
50.0000 mg | ORAL_TABLET | ORAL | Status: DC | PRN
  Administered 2022-02-24 – 2022-02-25 (×3): 50 mg via ORAL
  Filled 2022-02-23 (×3): qty 1

## 2022-02-23 MED ORDER — METHOCARBAMOL 1000 MG/10ML IJ SOLN: 500.0000 mg | Freq: Four times a day (QID) | INTRAVENOUS | Status: DC | PRN

## 2022-02-23 MED ORDER — DOCUSATE SODIUM 100 MG PO CAPS
100.0000 mg | ORAL_CAPSULE | Freq: Two times a day (BID) | ORAL | Status: DC
  Administered 2022-02-23 – 2022-02-25 (×4): 100 mg via ORAL
  Filled 2022-02-23 (×4): qty 1

## 2022-02-23 MED ORDER — LABETALOL HCL 5 MG/ML IV SOLN
INTRAVENOUS | Status: AC
  Filled 2022-02-23: qty 4

## 2022-02-23 MED ORDER — SUGAMMADEX SODIUM 200 MG/2ML IV SOLN
INTRAVENOUS | Status: DC | PRN
  Administered 2022-02-23: 200 mg via INTRAVENOUS

## 2022-02-23 MED ORDER — CHLORHEXIDINE GLUCONATE 0.12 % MT SOLN
OROMUCOSAL | Status: AC
  Administered 2022-02-23: 15 mL via OROMUCOSAL
  Filled 2022-02-23: qty 15

## 2022-02-23 MED ORDER — DEXAMETHASONE SODIUM PHOSPHATE 10 MG/ML IJ SOLN
INTRAMUSCULAR | Status: AC
  Filled 2022-02-23: qty 1

## 2022-02-23 MED ORDER — DEXAMETHASONE SODIUM PHOSPHATE 10 MG/ML IJ SOLN
INTRAMUSCULAR | Status: DC | PRN
  Administered 2022-02-23: 4 mg via INTRAVENOUS

## 2022-02-23 MED ORDER — LIDOCAINE 2% (20 MG/ML) 5 ML SYRINGE
INTRAMUSCULAR | Status: DC | PRN
  Administered 2022-02-23: 80 mg via INTRAVENOUS

## 2022-02-23 MED ORDER — FENTANYL CITRATE (PF) 250 MCG/5ML IJ SOLN
INTRAMUSCULAR | Status: AC
  Filled 2022-02-23: qty 5

## 2022-02-23 MED ORDER — HEMOSTATIC AGENTS (NO CHARGE) OPTIME
TOPICAL | Status: DC | PRN
  Administered 2022-02-23: 1 via TOPICAL

## 2022-02-23 MED ORDER — LABETALOL HCL 5 MG/ML IV SOLN
10.0000 mg | INTRAVENOUS | Status: DC | PRN
  Administered 2022-02-23: 10 mg via INTRAVENOUS

## 2022-02-23 MED ORDER — FENTANYL CITRATE (PF) 250 MCG/5ML IJ SOLN
INTRAMUSCULAR | Status: DC | PRN
  Administered 2022-02-23: 50 ug via INTRAVENOUS
  Administered 2022-02-23: 100 ug via INTRAVENOUS

## 2022-02-23 MED ORDER — CHLORHEXIDINE GLUCONATE CLOTH 2 % EX PADS: 6.0000 | MEDICATED_PAD | Freq: Once | CUTANEOUS | Status: DC

## 2022-02-23 MED ORDER — POTASSIUM CHLORIDE CRYS ER 20 MEQ PO TBCR: 40.0000 meq | EXTENDED_RELEASE_TABLET | Freq: Once | ORAL | Status: DC

## 2022-02-23 MED ORDER — LIDOCAINE 2% (20 MG/ML) 5 ML SYRINGE
INTRAMUSCULAR | Status: AC
  Filled 2022-02-23: qty 5

## 2022-02-23 MED ORDER — HYDROMORPHONE HCL 1 MG/ML IJ SOLN
0.5000 mg | INTRAMUSCULAR | Status: DC | PRN
  Administered 2022-02-23: 0.5 mg via INTRAVENOUS
  Filled 2022-02-23: qty 0.5

## 2022-02-23 MED ORDER — PROPOFOL 10 MG/ML IV BOLUS
INTRAVENOUS | Status: AC
  Filled 2022-02-23: qty 20

## 2022-02-23 MED ORDER — ROCURONIUM BROMIDE 10 MG/ML (PF) SYRINGE
PREFILLED_SYRINGE | INTRAVENOUS | Status: AC
  Filled 2022-02-23: qty 10

## 2022-02-23 MED ORDER — BUPIVACAINE-EPINEPHRINE (PF) 0.25% -1:200000 IJ SOLN
INTRAMUSCULAR | Status: AC
  Filled 2022-02-23: qty 30

## 2022-02-23 MED ORDER — HYDROMORPHONE HCL 1 MG/ML IJ SOLN
INTRAMUSCULAR | Status: AC
  Filled 2022-02-23: qty 1

## 2022-02-23 MED ORDER — POTASSIUM CHLORIDE CRYS ER 20 MEQ PO TBCR: 20.0000 meq | EXTENDED_RELEASE_TABLET | Freq: Once | ORAL | Status: DC

## 2022-02-23 MED ORDER — ONDANSETRON HCL 4 MG/2ML IJ SOLN
INTRAMUSCULAR | Status: DC | PRN
  Administered 2022-02-23: 4 mg via INTRAVENOUS

## 2022-02-23 MED ORDER — PROPOFOL 10 MG/ML IV BOLUS
INTRAVENOUS | Status: DC | PRN
  Administered 2022-02-23: 50 mg via INTRAVENOUS
  Administered 2022-02-23: 40 mg via INTRAVENOUS
  Administered 2022-02-23: 20 mg via INTRAVENOUS
  Administered 2022-02-23: 80 mg via INTRAVENOUS

## 2022-02-23 MED ORDER — SODIUM CHLORIDE 0.9 % IV SOLN: INTRAVENOUS | Status: DC

## 2022-02-23 MED ORDER — ONDANSETRON HCL 4 MG/2ML IJ SOLN
INTRAMUSCULAR | Status: AC
  Filled 2022-02-23: qty 2

## 2022-02-23 MED ORDER — CHLORHEXIDINE GLUCONATE CLOTH 2 % EX PADS
6.0000 | MEDICATED_PAD | Freq: Once | CUTANEOUS | Status: AC
  Administered 2022-02-23: 6 via TOPICAL

## 2022-02-23 MED ORDER — 0.9 % SODIUM CHLORIDE (POUR BTL) OPTIME
TOPICAL | Status: DC | PRN
  Administered 2022-02-23: 1000 mL

## 2022-02-23 MED ORDER — SIMETHICONE 80 MG PO CHEW: 80.0000 mg | CHEWABLE_TABLET | Freq: Four times a day (QID) | ORAL | Status: DC | PRN

## 2022-02-23 MED ORDER — HYDROMORPHONE HCL 1 MG/ML IJ SOLN
0.2500 mg | INTRAMUSCULAR | Status: DC | PRN
  Administered 2022-02-23 (×2): 0.5 mg via INTRAVENOUS

## 2022-02-23 MED ORDER — ROCURONIUM BROMIDE 10 MG/ML (PF) SYRINGE
PREFILLED_SYRINGE | INTRAVENOUS | Status: DC | PRN
  Administered 2022-02-23: 60 mg via INTRAVENOUS

## 2022-02-23 MED ORDER — DROPERIDOL 2.5 MG/ML IJ SOLN: 0.6250 mg | Freq: Once | INTRAMUSCULAR | Status: DC | PRN

## 2022-02-23 MED ORDER — CHLORHEXIDINE GLUCONATE 0.12 % MT SOLN: 15.0000 mL | Freq: Once | OROMUCOSAL | Status: AC

## 2022-02-23 SURGICAL SUPPLY — 45 items
ADH SKN CLS APL DERMABOND .7 (GAUZE/BANDAGES/DRESSINGS) ×2
APL PRP STRL LF DISP 70% ISPRP (MISCELLANEOUS) ×1
BAG SPEC RTRVL 10 TROC 200 (ENDOMECHANICALS) ×1
CANISTER SUCT 3000ML PPV (MISCELLANEOUS) ×1 IMPLANT
CHLORAPREP W/TINT 26 (MISCELLANEOUS) ×1 IMPLANT
CLIP LIGATING HEMO O LOK GREEN (MISCELLANEOUS) ×1 IMPLANT
COVER SURGICAL LIGHT HANDLE (MISCELLANEOUS) ×1 IMPLANT
COVER TRANSDUCER ULTRASND (DRAPES) ×1 IMPLANT
DERMABOND ADVANCED .7 DNX12 (GAUZE/BANDAGES/DRESSINGS) ×1 IMPLANT
ELECT REM PT RETURN 9FT ADLT (ELECTROSURGICAL) ×1
ELECTRODE REM PT RTRN 9FT ADLT (ELECTROSURGICAL) ×1 IMPLANT
ENDOLOOP SUT PDS II  0 18 (SUTURE) ×2
ENDOLOOP SUT PDS II 0 18 (SUTURE) IMPLANT
GLOVE BIO SURGEON STRL SZ7.5 (GLOVE) ×1 IMPLANT
GLOVE SURG SYN 7.5  E (GLOVE) ×1
GLOVE SURG SYN 7.5 E (GLOVE) ×1 IMPLANT
GLOVE SURG SYN 7.5 PF PI (GLOVE) ×1 IMPLANT
GOWN STRL REUS W/ TWL LRG LVL3 (GOWN DISPOSABLE) ×2 IMPLANT
GOWN STRL REUS W/ TWL XL LVL3 (GOWN DISPOSABLE) ×1 IMPLANT
GOWN STRL REUS W/TWL LRG LVL3 (GOWN DISPOSABLE) ×2
GOWN STRL REUS W/TWL XL LVL3 (GOWN DISPOSABLE) ×1
GRASPER SUT TROCAR 14GX15 (MISCELLANEOUS) ×1 IMPLANT
HEMOSTAT SNOW SURGICEL 2X4 (HEMOSTASIS) IMPLANT
KIT BASIN OR (CUSTOM PROCEDURE TRAY) ×1 IMPLANT
KIT TURNOVER KIT B (KITS) ×1 IMPLANT
NDL INSUFFLATION 14GA 120MM (NEEDLE) ×1 IMPLANT
NEEDLE INSUFFLATION 14GA 120MM (NEEDLE) ×1 IMPLANT
NS IRRIG 1000ML POUR BTL (IV SOLUTION) ×1 IMPLANT
PAD ARMBOARD 7.5X6 YLW CONV (MISCELLANEOUS) ×1 IMPLANT
POUCH LAPAROSCOPIC INSTRUMENT (MISCELLANEOUS) ×1 IMPLANT
POUCH RETRIEVAL ECOSAC 10 (ENDOMECHANICALS) IMPLANT
POUCH RETRIEVAL ECOSAC 10MM (ENDOMECHANICALS) ×1
SCISSORS LAP 5X35 DISP (ENDOMECHANICALS) ×1 IMPLANT
SET IRRIG TUBING LAPAROSCOPIC (IRRIGATION / IRRIGATOR) ×1 IMPLANT
SET TUBE SMOKE EVAC HIGH FLOW (TUBING) ×1 IMPLANT
SLEEVE ENDOPATH XCEL 5M (ENDOMECHANICALS) ×1 IMPLANT
SPECIMEN JAR SMALL (MISCELLANEOUS) ×1 IMPLANT
SUT MNCRL AB 4-0 PS2 18 (SUTURE) ×1 IMPLANT
TOWEL GREEN STERILE (TOWEL DISPOSABLE) ×1 IMPLANT
TOWEL GREEN STERILE FF (TOWEL DISPOSABLE) ×1 IMPLANT
TRAY LAPAROSCOPIC MC (CUSTOM PROCEDURE TRAY) ×1 IMPLANT
TROCAR XCEL NON-BLD 11X100MML (ENDOMECHANICALS) ×1 IMPLANT
TROCAR Z-THREAD OPTICAL 5X100M (TROCAR) ×1 IMPLANT
WARMER LAPAROSCOPE (MISCELLANEOUS) ×1 IMPLANT
WATER STERILE IRR 1000ML POUR (IV SOLUTION) ×1 IMPLANT

## 2022-02-23 NOTE — Progress Notes (Signed)
Patient arrived on unit via bed nil complaints voiced. Utilizing 2l oxygen. 4 surgical lap sites noted to abdomen with surgical glue. Condom cath in place voiding spontaneously. Movement and sensation present to all extremities. Vital signs stable. Bed alarm in place. Observation continues.

## 2022-02-23 NOTE — Progress Notes (Signed)
PROGRESS NOTE                                                                                                                                                                                                             Patient Demographics:    William [name], is a 81 y.o. male, DOB - [birth year], UXN:[id number]  Outpatient Primary MD for the patient is Ann Held, DO    LOS - 4  Admit date - 02/19/2022    Chief Complaint  Patient presents with   Abdominal Pain   Emesis   Nausea       Brief Narrative (HPI from H&P)   81 y.o. male with medical history significant of BPH; stage 3 CKD; h/o CVA; HTN; HLD; and mild vascular dementia presenting with abdominal pain.  He reports that he vomited this AM.  He had abdominal pain last night.  This AM, his emesis was dark with specks in it, wife thought bilious.  Him to the ER where work-up was consistent with acute cholecystitis, choledocholithiasis and he was admitted to the hospital.   Subjective:   Patient in bed, appears comfortable, denies any headache, no fever, no chest pain or pressure, no shortness of breath , no abdominal pain. No new focal weakness.    Assessment  & Plan :    Although patient is DNR during surgical procedures okay to keep him full code.     Acute cholecystitis in the setting of choledocholithiasis. He is being treated with bowel rest, IV fluids, empiric IV antibiotics, MRCP noted, GI general surgery following.  He is on Plavix which is on hold since admission on 02/19/2022, due for ERCP by GI on 02/22/2022 followed with most likely cholecystectomy on 02/23/22.  Currently no signs of sepsis symptoms have improved.  Continue to monitor with supportive care.  History of CVA with vascular dementia.  Plavix on hold due to #1 above, at risk for delirium, continue to monitor with supportive care.  Minimize narcotics and benzodiazepines.  Hypertension.  On  Norvasc and as needed hydralazine.  BPH.  Continue Flomax.  Stage IIIb CKD.  Currently on baseline monitor.      Condition - Extremely Guarded  Family Communication  : wife William [name] [phone] bedside on 02/20/22, 02/22/2022, 02/23/22  Code Status : DNR, full code during the surgical  procedures.  Consults  : GI, general surgery  PUD Prophylaxis : PPI   Procedures  :     MRCP -  Motion degraded images. Cholelithiasis, without associated inflammatory changes to suggest acute cholecystitis. Choledocholithiasis with multiple mid/distal CBD stones measuring up to 6 mm. ERCP is suggested. No peripancreatic fluid collection/pseudocyst in this patient with reported history of acute pancreatitis.      Disposition Plan  :    Status is: Inpatient  DVT Prophylaxis  :  Heparin  heparin injection 5,000 Units Start: 02/22/22 2200 SCDs Start: 02/19/22 1817    Lab Results  Component Value Date   PLT 175 02/23/2022    Diet :  Diet Order             Diet NPO time specified Except for: Sips with Meds  Diet effective midnight                    Inpatient Medications  Scheduled Meds:  amantadine  100 mg Oral Daily   amLODipine  10 mg Oral q morning   carBAMazepine  100 mg Oral BID   carBAMazepine  200 mg Oral QHS   heparin injection (subcutaneous)  5,000 Units Subcutaneous Q8H   indomethacin  100 mg Rectal Once   influenza vaccine adjuvanted  0.5 mL Intramuscular Tomorrow-1000   LORazepam  0.25 mg Oral Daily   LORazepam  1 mg Oral QHS   pantoprazole  40 mg Oral Daily   potassium chloride  20 mEq Oral Once   tamsulosin  0.4 mg Oral QPC supper   traZODone  75 mg Oral QHS   Continuous Infusions:  dextrose 5 % and 0.45 % NaCl with KCl 20 mEq/L 50 mL/hr at 02/23/22 0609   piperacillin-tazobactam (ZOSYN)  IV 3.375 g (02/23/22 0827)   PRN Meds:.acetaminophen **OR** acetaminophen, haloperidol lactate, hydrALAZINE, LORazepam, morphine injection, ondansetron **OR**  ondansetron (ZOFRAN) IV  Antibiotics  :    Anti-infectives (From admission, onward)    Start     Dose/Rate Route Frequency Ordered Stop   02/20/22 0000  piperacillin-tazobactam (ZOSYN) IVPB 3.375 g        3.375 g 12.5 mL/hr over 240 Minutes Intravenous Every 8 hours 02/19/22 1833     02/19/22 1615  piperacillin-tazobactam (ZOSYN) IVPB 3.375 g        3.375 g 100 mL/hr over 30 Minutes Intravenous  Once 02/19/22 1601 02/19/22 1831        Time Spent in minutes  30   Lala Lund M.D on 02/23/2022 at 8:30 AM  To page go to [email or web address]   [facility] -  Office  [phone]  See all Orders from today for further details    Objective:   Vitals:   02/22/22 1921 02/22/22 2344 02/23/22 0307 02/23/22 0759  BP: 136/67 121/62 122/61 118/60  Pulse: 72 72 65 62  Resp: '17 17 18 16  '$ Temp: 97.7 F (36.5 C) 97.8 F (36.6 C) 97.8 F (36.6 C) 97.7 F (36.5 C)  TempSrc: Oral  Oral Oral  SpO2: 96% 97% 98% 98%  Weight:      Height:        Wt Readings from Last 3 Encounters:  02/19/22 88.9 kg  01/04/22 87.6 kg  12/31/21 87.5 kg     Intake/Output Summary (Last 24 hours) at 02/23/2022 0830 Last data filed at 02/22/2022 2350 Gross per 24 hour  Intake 770.33 ml  Output 400 ml  Net 370.33 ml     Physical Exam  Awake Alert, No new F.N deficits, Normal affect [place].AT,PERRAL Supple Neck, No JVD,   Symmetrical Chest wall movement, Good air movement bilaterally, CTAB RRR,No Gallops, Rubs or new Murmurs,  +ve B.Sounds, Abd Soft, No tenderness,   No Cyanosis, Clubbing or edema       Data Review:    CBC Recent Labs  Lab 02/19/22 1118 02/20/22 0742 02/21/22 0142 02/22/22 0835 02/23/22 0328  WBC 7.8 5.4 5.5 5.2 7.0  HGB 11.8* 9.9* 9.3* 10.8* 9.7*  HCT 35.2* 29.4* 27.2* 31.6* 29.6*  PLT 209 161 154 172 175  MCV 97.8 98.0 96.8 96.3 97.7  MCH 32.8 33.0 33.1 32.9 32.0  MCHC 33.5 33.7 34.2 34.2 32.8  RDW 13.4 13.5 13.5 13.2 13.2  LYMPHSABS 0.7  --  2.0 1.4 1.6   MONOABS 0.8  --  0.6 0.5 0.7  EOSABS 0.0  --  0.2 0.2 0.1  BASOSABS 0.0  --  0.0 0.0 0.0    Electrolytes Recent Labs  Lab 02/19/22 1118 02/20/22 0742 02/21/22 0142 02/22/22 0835 02/23/22 0328  NA 138 139 139 139 138  K 4.3 3.9 3.8 3.5 3.6  CL 106 108 107 104 105  CO2 '26 23 25 27 26  '$ GLUCOSE 136* 103* 95 109* 128*  BUN 31* 26* '18 12 19  '$ CREATININE 1.64* 1.64* 1.67* 1.65* 2.52*  CALCIUM 8.7* 8.7* 8.6* 9.0 8.7*  AST 361* 125* 62* 34 25  ALT 265* 187* 136* 101* 76*  ALKPHOS 567* 417* 380* 367* 295*  BILITOT 3.2* 0.8 0.7 0.6 0.7  ALBUMIN 3.4* 2.8* 2.7* 3.1* 2.8*  MG  --  1.9 1.8 1.8 1.8  CRP  --  1.2* 1.4* 0.7 0.7  INR  --  1.1  --   --   --   BNP  --   --  38.3 61.6 49.0    ------------------------------------------------------------------------------------------------------------------ No results for input(s): "CHOL", "HDL", "LDLCALC", "TRIG", "CHOLHDL", "LDLDIRECT" in the last 72 hours.  Lab Results  Component Value Date   HGBA1C 5.3 07/27/2021    Micro Results No results found for this or any previous visit (from the past 240 hour(s)).  Radiology Reports DG ERCP  Result Date: 02/22/2022 CLINICAL DATA:  Cholelithiasis, choledocholithiasis, pancreatitis and biliary dilatation. EXAM: ERCP TECHNIQUE: Multiple spot images obtained with the fluoroscopic device and submitted for interpretation post-procedure. COMPARISON:  MRI/MRCP on 02/20/2022 and right upper quadrant ultrasound on 02/19/2022 FINDINGS: Images submitted and obtained with a C-arm demonstrate initial cholangiogram showing discrete filling defects within the distal aspect of a dilated common bile duct. There may be some filling defects in the cystic duct. Balloon sweep maneuver was performed with completion cholangiogram showing no further filling defects in the common bile duct. IMPRESSION: ERCP images demonstrate evidence of choledocholithiasis in the distal common bile duct with multiple filling defects noted  consistent with calculi. Balloon sweep maneuver was performed to remove calculi. There may also be some filling defects in the cystic duct. These images were submitted for radiologic interpretation only. Please see the procedural report for the amount of contrast and the fluoroscopy time utilized. Electronically Signed   By: Aletta Edouard M.D.   On: 02/22/2022 13:50   MR ABDOMEN MRCP W WO CONTAST  Result Date: 02/20/2022 CLINICAL DATA:  Cholelithiasis, evaluate for choledocholithiasis EXAM: MRI ABDOMEN WITHOUT AND WITH CONTRAST (INCLUDING MRCP) TECHNIQUE: Multiplanar multisequence MR imaging of the abdomen was performed both before and after the administration of intravenous contrast. Heavily T2-weighted images of the biliary and pancreatic ducts were  obtained, and three-dimensional MRCP images were rendered by post processing. CONTRAST:  54m GADAVIST GADOBUTROL 1 MMOL/ML IV SOLN COMPARISON:  Right upper quadrant ultrasound dated 02/19/2022. CT abdomen/pelvis dated 10/02/2021. FINDINGS: Motion degraded images. Lower chest: Lung bases are clear. Hepatobiliary: Liver is unremarkable. Large gallstones, without associated inflammatory changes to suggest acute cholecystitis. No intrahepatic ductal dilatation. Dilated common duct, measuring 8 mm. Multiple mid/distal CBD stones measuring up to 6 mm (series 7/image 20). Pancreas: Grossly unremarkable in this patient with history of acute pancreatitis. No peripancreatic fluid collection/pseudocyst. Spleen:  Within normal limits. Adrenals/Urinary Tract:  Adrenal glands are within normal limits. Bilateral renal cysts, including a dominant 6.1 cm right renal sinus cyst (series 8/image 23), benign (Bosniak I). No hydronephrosis. Stomach/Bowel: Stomach is notable for a small hiatal hernia. Visualized bowel is grossly unremarkable. Vascular/Lymphatic:  No evidence of abdominal aortic aneurysm. No suspicious abdominal lymphadenopathy. Other:  No abdominal ascites.  Musculoskeletal: Mild degenerative changes of the lumbar spine. IMPRESSION: Motion degraded images. Cholelithiasis, without associated inflammatory changes to suggest acute cholecystitis. Choledocholithiasis with multiple mid/distal CBD stones measuring up to 6 mm. ERCP is suggested. No peripancreatic fluid collection/pseudocyst in this patient with reported history of acute pancreatitis. Electronically Signed   By: SJulian HyM.D.   On: 02/20/2022 01:45   UK[place]Abdomen Limited RUQ (LIVER/GB)  Result Date: 02/19/2022 CLINICAL DATA:  Pancreatitis in a 81year old male. Assess for ductal dilation. EXAM: ULTRASOUND ABDOMEN LIMITED RIGHT UPPER QUADRANT COMPARISON:  April 2023 FINDINGS: Gallbladder: The gallbladder is filled with gallstones. Wall thickness is difficult to evaluate given the presence of numerous gallstones. The anterior wall does not appear grossly thickened. The posterior wall cannot be seen due to shadowing. No pericholecystic fluid. Calculi as large as 2 cm or greater, individual stones are difficult to evaluate. No reported tenderness over the gallbladder. Common bile duct: Diameter: 10 mm Liver: Lobular hepatic contours. Signs of fissural widening. Echogenic liver. Portal vein is patent on color Doppler imaging with normal direction of blood flow towards the liver. Other: Renal cysts of the RIGHT kidney are better characterized on previous imaging. These are not fully evaluated on the current exam. Based on previous imaging no dedicated imaging follow-up of this area is recommended. IMPRESSION: Dilation of the common bile duct at 1 cm in the setting of acute pancreatitis based on clinical history. MRCP may be helpful for further evaluation to exclude choledocholithiasis in this patient with extensive cholelithiasis. Gallbladder not well assessed due to extensive cholelithiasis. No reported tenderness over the gallbladder at this time. Electronically Signed   By: GZetta BillsM.D.   On:  02/19/2022 15:12

## 2022-02-23 NOTE — Anesthesia Procedure Notes (Signed)
Procedure Name: Intubation Date/Time: 02/23/2022 6:08 PM  Performed by: Erick Colace, CRNAPre-anesthesia Checklist: Patient identified, Emergency Drugs available, Suction available and Patient being monitored Patient Re-evaluated:Patient Re-evaluated prior to induction Oxygen Delivery Method: Circle system utilized Preoxygenation: Pre-oxygenation with 100% oxygen Induction Type: IV induction Ventilation: Mask ventilation without difficulty and Oral airway inserted - appropriate to patient size Laryngoscope Size: Mac and 4 Grade View: Grade II Tube type: Oral Tube size: 7.5 mm Number of attempts: 1 Airway Equipment and Method: Stylet and Oral airway Placement Confirmation: ETT inserted through vocal cords under direct vision, positive ETCO2 and breath sounds checked- equal and bilateral Secured at: 22 cm Tube secured with: Tape Dental Injury: Teeth and Oropharynx as per pre-operative assessment

## 2022-02-23 NOTE — Progress Notes (Signed)
Pharmacy Antibiotic Note  William [name] is a 81 y.o. male admitted on 02/19/2022 with  intra-abdominal infection .  Pharmacy has been consulted for Zosyn dosing. Now post-ERCP by GI 9/11 with stones removed, pending cholecystectomy. Scr sharply increased from 1.65 > 2.52. Continue to monitor, Zosyn dose appropriate for current renal function but will need reduction if further decline in renal function.  Plan: Zosyn 3.375 g IV Q8h Trend WBC, fever, renal function F/u cultures, clinical progress, levels as indicated De-escalate when able   Height: '5\' 9"'$  (175.3 cm) Weight: 88.9 kg (196 lb) IBW/kg (Calculated) : 70.7  Temp (24hrs), Avg:97.8 F (36.6 C), Min:97.4 F (36.3 C), Max:98.1 F (36.7 C)  Recent Labs  Lab 02/19/22 1118 02/20/22 0742 02/21/22 0142 02/22/22 0835 02/23/22 0328  WBC 7.8 5.4 5.5 5.2 7.0  CREATININE 1.64* 1.64* 1.67* 1.65* 2.52*    Estimated Creatinine Clearance: 26.2 mL/min (A) (by C-G formula based on SCr of 2.52 mg/dL (H)).    Allergies  Allergen Reactions   Bee Venom Anaphylaxis   Strawberry Extract Hives   Latex Itching   Zetia [Ezetimibe] Other (See Comments)    Intolerance    Adhesive [Tape] Other (See Comments)    BLISTER   Statins Other (See Comments)    myalgias    Antimicrobials this admission: Zosyn 9/08 >>    Thank you for allowing pharmacy to be a part of this patient's care.  Ardyth Harps, PharmD Clinical Pharmacist

## 2022-02-23 NOTE — Op Note (Signed)
Patient: William [name] (1940/09/10, [id number])  Date of Surgery: 02/23/22  Preoperative Diagnosis: Gallstones, choledocholithiasis  Postoperative Diagnosis: Gallstones, choledocholithiasis   Surgical Procedure: LAPAROSCOPIC CHOLECYSTECTOMY:    Operative Team Members:  Surgeon(s) and Role:    * Larah Kuntzman, Nickola Major, MD - Primary   Anesthesiologist: Nolon Nations, MD CRNA: Erick Colace, CRNA   Anesthesia: General   Fluids:  Total I/O In: 792.3 [I.V.:792.3] Out: 20 [NWGNF:62]  Complications: None  Drains:  none   Specimen:  ID Type Source Tests Collected by Time Destination  1 : Gallbladder Tissue PATH Gallbladder SURGICAL PATHOLOGY Heiley Shaikh, Nickola Major, MD 02/23/2022 1838      Disposition:  PACU - hemodynamically stable.  Plan of Care:  Continue inpatient care    Indications for Procedure: William [name] is a 81 y.o. male who presented with choledocholithiasis, underwent ERCP yesterday, plan for laparoscopic cholecystectomy today.  Laparoscopic cholecystectomy was recommended for the patient.  The procedure itself, as well as the risks, benefits and alternatives were discussed with the patient.  Risks discussed included but were not limited to the risk of infection, bleeding, damage to nearby structures, need to convert to open procedure, incisional hernia, bile leak, common bile duct injury and the need for additional procedures or surgeries.  With this discussion complete and all questions answered the patient granted consent to proceed.  Findings: Large and small gallstones, some spillage of gallstones during the case  Infection status: Patient: William [name] Emergency General Surgery Service Patient Case: Urgent Infection Present At Time Of Surgery (PATOS):  Spillage of gallstones   Description of Procedure:   On the date stated above, the patient was taken to the operating room suite and placed in supine positioning.  Sequential compression devices were  placed on the lower extremities to prevent blood clots.  General endotracheal anesthesia was induced. Preoperative antibiotics were given.  The patient's abdomen was prepped and draped in the usual sterile fashion.  A time-out was completed verifying the correct patient, procedure, positioning and equipment needed for the case.  We began by anesthetizing the skin with local anesthetic and then making a 5 mm incision just below the umbilicus.  We dissected through the subcutaneous tissues to the fascia.  The fascia was grasped and elevated using a Kocher clamp.  A Veress needle was inserted into the abdomen and the abdomen was insufflated to 15 mmHg.  A 5 mm trocar was inserted in this position under optical guidance and then the abdomen was inspected.  There was no trauma to the underlying viscera with initial trocar placement.  Any abnormal findings, other than inflammation in the right upper quadrant, are listed above in the findings section.  Three additional trocars were placed, one 12 mm trocar in the subxiphoid position, one 5 mm trocar in the midline epigastric area and one 72m trocar in the right upper quadrant subcostally.  These were placed under direct vision without any trauma to the underlying viscera.    The patient was then placed in head up, left side down positioning.  The gallbladder was identified and dissected free from its attachments to the omentum allowing the duodenum to fall away.  The infundibulum of the gallbladder was dissected free working laterally to medially.  The cystic duct and cystic artery were dissected free from surrounding connective tissue.  The infundibulum of the gallbladder was dissected off the cystic plate.  A critical view of safety was obtained with the cystic duct and cystic artery being cleared of  connective tissues and clearly the only two structures entering into the gallbladder with the liver clearly visible behind.  Clips were then applied to the cystic duct  and cystic artery and then these structures were divided.  A PDS endoloop was applied to secure the enlarged cystic duct.  The gallbladder was dissected off the cystic plate, placed in an endocatch bag and removed from the 12 mm subxiphoid port site.  The clips were inspected and appeared effective.  The cystic plate was inspected and hemostasis was obtained using electrocautery.  A suction irrigator was used to clean the operative field. Stone scoop was used to remove all visible gallstones.  Snow topical hemostatic agent was placed in gallbladder fossa.  Attention was turned to closure.  The 12 mm subxiphoid port site was closed using a 0-vicryl suture.  The abdomen was desufflated.  The skin was closed using 4-0 monocryl and dermabond.  All sponge and needle counts were correct at the conclusion of the case.    William Raw, MD General, Bariatric, & Minimally Invasive Surgery [facility], [place]

## 2022-02-23 NOTE — Progress Notes (Signed)
Taking over for Dr. Rosendo Gros.  Plan for lap chole today after ERCP yesterday.  I discussed risks, benefits and alternatives with the patient and his wife who granted consent to proceed.  Will proceed as scheduled.  Felicie Morn, MD General, Bariatric and Minimally Invasive Surgery [place]

## 2022-02-23 NOTE — Progress Notes (Signed)
Physical Therapy Treatment Patient Details Name: William [name] MRN: [id number] DOB: 1940/06/20 Today's Date: 02/23/2022   History of Present Illness 81 yo male with onset of abd pain and emesis was admitted on 9/8.  Pti has acute cholecystitis and choledocholithiasis, with plan to avoid invasive tx given his stroke history.  PMHx:  stroke with L  hemiparesis, CKD3, HTN, vascular dementia    PT Comments    Patient eager to mobilize with therapy and ambulate. PT required repeated cues and mod assist to complete bed mobility and transfers. Min assist with HHA to ambulate 39' in hallway. Pt has significant Rt lean if no support provided on Rt side; manual cues for posture required to facilitate midline. No cues effective to improve step length. Pt tends to get frustrated when he becomes aware of an impairments and cues needed to redirect. EOS pt returned to bed with bed placed in chair position. Plan for pt to OR this evening.     Recommendations for follow up therapy are one component of a multi-disciplinary discharge planning process, led by the attending physician.  Recommendations may be updated based on patient status, additional functional criteria and insurance authorization.  Follow Up Recommendations  Skilled nursing-short term rehab (<3 hours/day) Can patient physically be transported by private vehicle: Yes   Assistance Recommended at Discharge Intermittent Supervision/Assistance  Patient can return home with the following A little help with walking and/or transfers;A little help with bathing/dressing/bathroom;Direct supervision/assist for medications management;Direct supervision/assist for financial management;Assist for transportation;Help with stairs or ramp for entrance   Equipment Recommendations  None recommended by PT    Recommendations for Other Services       Precautions / Restrictions Precautions Precautions: Fall Precaution Comments: L  hemiparesis Restrictions Weight Bearing Restrictions: No Other Position/Activity Restrictions: pt struggles with walker and SPC, leans Rt significantly     Mobility  Bed Mobility Overal bed mobility: Needs Assistance Bed Mobility: Rolling, Sidelying to Sit, Sit to Sidelying Rolling: Mod assist Sidelying to sit: Mod assist     Sit to sidelying: Mod assist General bed mobility comments: Multimodal cues to sequence roll to Rt and up to EOB from side. Pt required mod assist to reach for rail and bring LE's off EOB. EOS mod assit to return to supine and 2+ to boost.    Transfers Overall transfer level: Needs assistance Equipment used: 1 person hand held assist Transfers: Sit to/from Stand Sit to Stand: Mod assist           General transfer comment: mod A from elevated surface, 2+ for safety HHA    Ambulation/Gait Ambulation/Gait assistance: Min assist Gait Distance (Feet): 80 Feet Assistive device: 1 person hand held assist, 2 person hand held assist Gait Pattern/deviations: Step-to pattern, Decreased step length - right, Decreased step length - left, Decreased stride length, Narrow base of support, Shuffle Gait velocity: decreased     General Gait Details: pt leans heavily to Rt if no one provide HHA on Rt side. step length short and shuffled with low foot clearance throughout. pt with great difficulty coordinating longer steps. c/o slight dizziness halfway through gait.   Stairs             Wheelchair Mobility    Modified Rankin (Stroke Patients Only)       Balance Overall balance assessment: Needs assistance Sitting-balance support: Feet supported Sitting balance-Leahy Scale: Fair     Standing balance support: Single extremity supported Standing balance-Leahy Scale: Poor  Cognition Arousal/Alertness: Awake/alert Behavior During Therapy: Flat affect, Impulsive Overall Cognitive Status: History of cognitive  impairments - at baseline                                          Exercises      General Comments        Pertinent Vitals/Pain Pain Assessment Pain Assessment: No/denies pain    Home Living                          Prior Function            PT Goals (current goals can now be found in the care plan section) Acute Rehab PT Goals Patient Stated Goal: none stated PT Goal Formulation: With family Time For Goal Achievement: 03/06/22 Potential to Achieve Goals: Good Progress towards PT goals: Progressing toward goals    Frequency    Min 3X/week      PT Plan Current plan remains appropriate    Co-evaluation              AM-PAC PT "6 Clicks" Mobility   Outcome Measure  Help needed turning from your back to your side while in a flat bed without using bedrails?: A Lot Help needed moving from lying on your back to sitting on the side of a flat bed without using bedrails?: A Lot Help needed moving to and from a bed to a chair (including a wheelchair)?: A Lot Help needed standing up from a chair using your arms (e.g., wheelchair or bedside chair)?: A Lot Help needed to walk in hospital room?: A Little Help needed climbing 3-5 steps with a railing? : Total 6 Click Score: 12    End of Session Equipment Utilized During Treatment: Gait belt Activity Tolerance: Patient tolerated treatment well Patient left: in bed;with call bell/phone within reach;with bed alarm set;with family/visitor present Nurse Communication: Mobility status PT Visit Diagnosis: Unsteadiness on feet (R26.81);Muscle weakness (generalized) (M62.81);Difficulty in walking, not elsewhere classified (R26.2);Hemiplegia and hemiparesis Hemiplegia - Right/Left: Left Hemiplegia - dominant/non-dominant: Non-dominant Hemiplegia - caused by: Unspecified     Time: 6546-5035 PT Time Calculation (min) (ACUTE ONLY): 31 min  Charges:  $Gait Training: 8-22 mins $Therapeutic  Activity: 8-22 mins                     Verner Mould, DPT Acute Rehabilitation Services Office [phone] Pager [phone]  02/23/22 3:51 PM

## 2022-02-23 NOTE — Anesthesia Preprocedure Evaluation (Addendum)
Anesthesia Evaluation  Patient identified by MRN, date of birth, ID band Patient awake    Reviewed: Allergy & Precautions, H&P , NPO status , Patient's Chart, lab work & pertinent test results  Airway Mallampati: II  TM Distance: >3 FB Neck ROM: Full    Dental  (+) Dental Advisory Given, Upper Dentures, Poor Dentition, Missing, Chipped   Pulmonary former smoker,    Pulmonary exam normal breath sounds clear to auscultation       Cardiovascular hypertension, Pt. on medications + Peripheral Vascular Disease  Normal cardiovascular exam Rhythm:Regular Rate:Normal  Echo 07/2021 1. Left ventricular ejection fraction, by estimation, is 60 to 65%. The left ventricle has normal function. The left ventricle has no regional wall motion abnormalities. There is mild left ventricular hypertrophy. Left ventricular diastolic parameters are consistent with Grade I diastolic dysfunction (impaired relaxation).  2. Right ventricular systolic function is normal. The right ventricular size is normal. Tricuspid regurgitation signal is inadequate for assessing PA pressure.  3. The mitral valve is grossly normal. No evidence of mitral valve regurgitation. No evidence of mitral stenosis.  4. The aortic valve was not well visualized. Aortic valve regurgitation is not visualized. No aortic stenosis is present.    Neuro/Psych  Headaches, PSYCHIATRIC DISORDERS Anxiety Depression CVA, Residual Symptoms negative neurological ROS     GI/Hepatic Neg liver ROS, GERD  Medicated,  Endo/Other  negative endocrine ROS  Renal/GU Renal disease     Musculoskeletal  (+) Arthritis ,   Abdominal (+) + obese,   Peds  Hematology  (+) Blood dyscrasia, anemia ,   Anesthesia Other Findings   Reproductive/Obstetrics                           Anesthesia Physical  Anesthesia Plan  ASA: 3  Anesthesia Plan: General   Post-op Pain Management:  Ofirmev IV (intra-op)*   Induction: Intravenous  PONV Risk Score and Plan: 4 or greater and Ondansetron, Dexamethasone and Treatment may vary due to age or medical condition  Airway Management Planned: Oral ETT  Additional Equipment: None  Intra-op Plan:   Post-operative Plan: Extubation in OR  Informed Consent: I have reviewed the patients History and Physical, chart, labs and discussed the procedure including the risks, benefits and alternatives for the proposed anesthesia with the patient or authorized representative who has indicated his/her understanding and acceptance.     Dental advisory given  Plan Discussed with: CRNA  Anesthesia Plan Comments:        Anesthesia Quick Evaluation

## 2022-02-23 NOTE — TOC Initial Note (Signed)
Transition of Care [facility]) - Initial/Assessment Note    Patient Details  Name: William [name] MRN: [id number] Date of Birth: 05-11-1941  Transition of [facility]) CM/SW Contact:    William Labrum, RN Phone Number: 02/23/2022, 1:28 PM  Clinical Narrative:                 CM met with the patient and the wife at the bedside to discuss transitions of care to home in the next 1-2 days with planned Lap cholecystectomy with Dr. Rosendo Gros at 4 pm today.  The patient lives with his wife at the home that provides 24 hour care to the patient due to frequent falls and memory issues.  The patient is pleasant and wife is at bedside to discuss needs.  DME - patient currently has hospital bed, WC, RW, BSC, Cane through [employer].  Home Health - Patient's wife was given Medicare choice regarding home health and she states the patient has had [facility] in the past but would like to choose another company for PT/OT/aide.  The wife did not have a preference.  I called William [name], CM with [facility] and she accepted for services.  Personal Care Journalist, newspaper) - The patient currently pays privately for [place] through [facility].  The patient's wife requests additional PCS Services through [facility] - I called and left a message with William [name], [place] with [employer] to follow up requesting the additional 20 hours through [place] be paid by the [employer]  CM will continue to follow the patient for discharge needs for home after Lap Chole surgery tonight.    Expected Discharge Plan: [place] Barriers to Discharge: Continued Medical Work up   Patient Goals and CMS Choice Patient states their goals for this hospitalization and ongoing recovery are:: To get better and discharge home with wife - per wife CMS Medicare.gov Compare Post Acute Care list provided to:: Patient Represenative (must comment) (Patient's wife - William [name] -) Choice offered to / list presented to : Spouse  Expected  Discharge Plan and Services Expected Discharge Plan: [place] In-house Referral: PCP / [facility] Discharge Planning Services: CM Consult Post Acute Care Choice: Home Health, Resumption of Svcs/PTA Provider Living arrangements for the past 2 months: Single Family Home                           HH Arranged: RN [facility] Agency: [place] ([place]) Date [place]: 02/23/22 Time [place]: 1325 Representative spoke with at [place]: William [name], [place] with [facility]  Prior Living Arrangements/Services Living arrangements for the past 2 months: [place] with:: Spouse Patient language and need for interpreter reviewed:: Yes Do you feel safe going back to the place where you live?: Yes      Need for Family Participation in Patient Care: Yes (Comment) Care giver support system in place?: Yes (comment) Current home services: DME (Hospital bed, BSC, RW, Cane, WC - thru [employer]) Criminal Activity/Legal Involvement Pertinent to Current Situation/Hospitalization: No - Comment as needed  Activities of Daily Living Home Assistive Devices/Equipment: None ADL Screening (condition at time of admission) Patient's cognitive ability adequate to safely complete daily activities?: No Is the patient deaf or have difficulty hearing?: No Does the patient have difficulty seeing, even when wearing glasses/contacts?: No Does the patient have difficulty concentrating, remembering, or making decisions?: Yes Patient able to express need for assistance  with ADLs?: Yes Does the patient have difficulty dressing or bathing?: Yes Independently performs ADLs?: No Grooming: Needs assistance Is this a change from baseline?: Pre-admission baseline Feeding: Needs assistance Is this a change from baseline?: Pre-admission baseline Bathing: Needs assistance Is this a change from baseline?: Pre-admission baseline Toileting: Needs assistance Is this a change  from baseline?: Pre-admission baseline In/Out Bed: Needs assistance Is this a change from baseline?: Pre-admission baseline Walks in Home: Needs assistance Is this a change from baseline?: Pre-admission baseline Does the patient have difficulty walking or climbing stairs?: Yes Weakness of Legs: Both Weakness of Arms/Hands: Left  Permission Sought/Granted Permission sought to share information with : Case Manager, Customer service manager, Family Supports Permission granted to share information with : Yes, Verbal Permission Granted     Permission granted to share info w AGENCY: HTA insurance, [facility] for home health services, William [name] [facility] - for [employer] through [employer]  Permission granted to share info w Relationship: William [name], wife -     Emotional Assessment Appearance:: Appears stated age Attitude/Demeanor/Rapport: Unable to Assess Affect (typically observed): Accepting Orientation: : Oriented to Self Alcohol / Substance Use: Not Applicable Psych Involvement: No (comment)  Admission diagnosis:  Choledocholithiasis [K80.50] Patient Active Problem List   Diagnosis Date Noted   Choledocholithiasis 02/19/2022   DNR (do not resuscitate) 02/19/2022   Left elbow pain 01/26/2022   Mid back pain on left side 01/26/2022   Rib pain 01/26/2022   Acute pain of left shoulder 11/12/2021   Leukocytes in urine 11/12/2021   Urinary frequency 11/12/2021   Thrush 10/08/2021   Hemiplegia, dominant side S/P CVA (cerebrovascular accident) ([place]) 09/11/2021   Insomnia    Prediabetes    Chronic kidney disease (CKD), stage IV (severe) (HCC)    Basal ganglia infarction ([place]) 07/29/2021   Transaminitis 07/27/2021   UTI (urinary tract infection) 07/27/2021   CVA (cerebral vascular accident) ([place]) 07/27/2021   Fall 07/27/2021   Hyperglycemia 07/27/2021   CKD (chronic kidney disease), stage IV ([place]) 07/27/2021   Cholelithiasis 07/27/2021   Hypoxia 07/27/2021   Nausea and vomiting 83/25/4982    Acute metabolic encephalopathy 64/15/8309   Normocytic anemia 07/27/2021   Chronic back pain 07/27/2021   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[place]) 06/22/2021   Closed fracture of first lumbar vertebra with routine healing 02/03/2021   Closed fracture of multiple ribs 11/18/2020   Anxiety 01/29/2020   Leg pain, bilateral 01/29/2020   Ingrown toenail 07/13/2019   Lumbar spondylosis 05/02/2018   Pain in left knee 03/09/2018   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   Trochanteric bursitis of left hip 01/16/2018   Preventative health care 09/26/2017   HTN (hypertension) 07/19/2015   Hyperlipidemia 07/19/2015   Great toe pain 02/11/2014   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   Obesity (BMI 30-39.9) 06/25/2013   Renal insufficiency 06/25/2013   Weakness of left arm 06/25/2013   Sebaceous cyst 03/03/2011   Sprain of lumbar region 07/31/2010   Rib pain, left 08/29/2009   Carotid artery stenosis, asymptomatic, bilateral 05/02/2009   Eczema, atopic 05/31/2008   Vitamin D deficiency 03/01/2008   BPH (benign prostatic hyperplasia) 08/06/2007   Fasting hyperglycemia 12/21/2006   History of right MCA infarct 2006   PCP:  Ann Held, DO Pharmacy:   [facility] [address], [place] [address] Phone: [phone] Fax: [place] [address] [place] [address]  Phone: [phone] Fax: [place] [address] [name] [address] Phone: [phone] Fax: [phone]  [id number] [place], [place] [address] [name] [address] Phone: [phone] Fax: [phone]  [id number] - [place], [place] [address] [name] [address] Phone: [phone] Fax:  [phone]     Social Determinants of Health (SDOH) Interventions    Readmission Risk Interventions    02/23/2022    1:27 PM  Readmission Risk Prevention Plan  Transportation Screening Complete  PCP or Specialist Appt within 3-5 Days Complete  HRI or [place] Complete  Social Work Consult for [place] Planning/Counseling Complete  Palliative Care Screening Complete  Medication Review Press photographer) Complete

## 2022-02-23 NOTE — Progress Notes (Signed)
Progress Note  1 Day Post-Op  Subjective: ERCP went well yesterday. Not having any abdominal pain currently and denies nausea or emesis but is NPO currently  Wife is bedside  Objective: Vital signs in last 24 hours: Temp:  [97.4 F (36.3 C)-98.6 F (37 C)] 97.7 F (36.5 C) (09/12 0759) Pulse Rate:  [62-74] 62 (09/12 0759) Resp:  [13-24] 16 (09/12 0759) BP: (118-150)/(60-75) 118/60 (09/12 0759) SpO2:  [94 %-98 %] 98 % (09/12 0759) Last BM Date : 02/21/22  Intake/Output from previous day: 09/11 0701 - 09/12 0700 In: 770.3 [P.O.:120; I.V.:620.1; IV Piggyback:30.2] Out: 4193 [Urine:1050] Intake/Output this shift: No intake/output data recorded.  PE: General: pleasant, WD, male who is laying in bed in NAD Lungs: Respiratory effort nonlabored Abd: soft, ND, +BS. No abdominal TTP MSK: all 4 extremities are symmetrical with no cyanosis, clubbing, or edema. Skin: warm and dry Psych: A&Ox3 with an appropriate affect.    Lab Results:  Recent Labs    02/22/22 0835 02/23/22 0328  WBC 5.2 7.0  HGB 10.8* 9.7*  HCT 31.6* 29.6*  PLT 172 175    BMET Recent Labs    02/22/22 0835 02/23/22 0328  NA 139 138  K 3.5 3.6  CL 104 105  CO2 27 26  GLUCOSE 109* 128*  BUN 12 19  CREATININE 1.65* 2.52*  CALCIUM 9.0 8.7*    PT/INR No results for input(s): "LABPROT", "INR" in the last 72 hours.  CMP     Component Value Date/Time   NA 138 02/23/2022 0328   NA 142 04/25/2019 1000   K 3.6 02/23/2022 0328   CL 105 02/23/2022 0328   CO2 26 02/23/2022 0328   GLUCOSE 128 (H) 02/23/2022 0328   BUN 19 02/23/2022 0328   BUN 30 (H) 04/25/2019 1000   CREATININE 2.52 (H) 02/23/2022 0328   CREATININE 1.71 (H) 12/25/2021 1433   CALCIUM 8.7 (L) 02/23/2022 0328   PROT 5.8 (L) 02/23/2022 0328   PROT 7.4 04/25/2019 1000   ALBUMIN 2.8 (L) 02/23/2022 0328   ALBUMIN 4.2 04/25/2019 1000   AST 25 02/23/2022 0328   ALT 76 (H) 02/23/2022 0328   ALKPHOS 295 (H) 02/23/2022 0328   BILITOT  0.7 02/23/2022 0328   BILITOT 0.3 04/25/2019 1000   GFRNONAA 25 (L) 02/23/2022 0328   GFRAA 34 (L) 04/25/2019 1000   Lipase     Component Value Date/Time   LIPASE 62 (H) 02/21/2022 0142       Studies/Results: DG ERCP  Result Date: 02/22/2022 CLINICAL DATA:  Cholelithiasis, choledocholithiasis, pancreatitis and biliary dilatation. EXAM: ERCP TECHNIQUE: Multiple spot images obtained with the fluoroscopic device and submitted for interpretation post-procedure. COMPARISON:  MRI/MRCP on 02/20/2022 and right upper quadrant ultrasound on 02/19/2022 FINDINGS: Images submitted and obtained with a C-arm demonstrate initial cholangiogram showing discrete filling defects within the distal aspect of a dilated common bile duct. There may be some filling defects in the cystic duct. Balloon sweep maneuver was performed with completion cholangiogram showing no further filling defects in the common bile duct. IMPRESSION: ERCP images demonstrate evidence of choledocholithiasis in the distal common bile duct with multiple filling defects noted consistent with calculi. Balloon sweep maneuver was performed to remove calculi. There may also be some filling defects in the cystic duct. These images were submitted for radiologic interpretation only. Please see the procedural report for the amount of contrast and the fluoroscopy time utilized. Electronically Signed   By: Aletta Edouard M.D.   On: 02/22/2022 13:50  Anti-infectives: Anti-infectives (From admission, onward)    Start     Dose/Rate Route Frequency Ordered Stop   02/20/22 0000  piperacillin-tazobactam (ZOSYN) IVPB 3.375 g        3.375 g 12.5 mL/hr over 240 Minutes Intravenous Every 8 hours 02/19/22 1833     02/19/22 1615  piperacillin-tazobactam (ZOSYN) IVPB 3.375 g        3.375 g 100 mL/hr over 30 Minutes Intravenous  Once 02/19/22 1601 02/19/22 1831        Assessment/Plan  Choledocholithiasis -MRCP with cholelithiasis and multiple mid to  distal CBD stones.  Pancreas grossly unremarkable in setting of elevated lipase -Lipase only minimally elevated at 62 9/10 and no abdominal TTP in RUQ or epigastrium  -ERCP 9/11. Stones removed and sphincterotomy performed - afebrile and hemodynamically stable without evidence of GB inflammation on imaging and WBC normal but with some RUQ pain on admission. continue IV abx - Zosyn - tolerated FLD. NPO for possible OR today. -Continue to hold Plavix  After further discussion wife is concerned that he will have episodes of biliary colic and/or risk for infection in the future and prefers operative management this admission if possible. Will plan lap chole  I have explained the procedure, risks, and aftercare of cholecystectomy to patient and wife.  Risks include but are not limited to bleeding, infection, wound problems, anesthesia, diarrhea, bile leak, injury to common bile duct/liver/intestine.  Patient and wife seem to understand and agree to proceed.   FEN: NPO for OR [place]:HCWCB VTE: plavix held (9/7 0900), hep subq   Per primary: CVA w/ left sided hemiplegia (2006, 07/2021) - on plavix HTN HLD Carotid artery stenosis s/p b/l CEA CKD stage IV Remote hx of bladder cancer s/p TURP  GERD   I reviewed Consultant GI notes, hospitalist notes, last 24 h vitals and pain scores, last 48 h intake and output, last 24 h labs and trends, and last 24 h imaging results.    LOS: 4 days   [facility] 02/23/2022, 9:50 AM Please see Amion for pager number during day hours 7:00am-4:30pm

## 2022-02-23 NOTE — Transfer of Care (Signed)
Immediate Anesthesia Transfer of Care Note  Patient: William [name]  Procedure(s) Performed: LAPAROSCOPIC CHOLECYSTECTOMY (Abdomen)  Patient Location: PACU  Anesthesia Type:General  Level of Consciousness: awake and alert   Airway & Oxygen Therapy: Patient Spontanous Breathing and Patient connected to nasal cannula oxygen  Post-op Assessment: Report given to RN, Post -op Vital signs reviewed and stable and Patient moving all extremities X 4  Post vital signs: Reviewed and stable  Last Vitals:  Vitals Value Taken Time  BP 192/76 02/23/22 1912  Temp    Pulse 100 02/23/22 1914  Resp 17 02/23/22 1914  SpO2 91 % 02/23/22 1914  Vitals shown include unvalidated device data.  Last Pain:  Vitals:   02/23/22 1633  TempSrc:   PainSc: 0-No pain      Patients Stated Pain Goal: 0 (09/81/19 1478)  Complications: No notable events documented.

## 2022-02-24 ENCOUNTER — Other Ambulatory Visit (HOSPITAL_COMMUNITY): Payer: Self-pay

## 2022-02-24 ENCOUNTER — Encounter (HOSPITAL_COMMUNITY): Payer: Self-pay | Admitting: Surgery

## 2022-02-24 ENCOUNTER — Ambulatory Visit: Payer: PPO | Admitting: Physical Therapy

## 2022-02-24 DIAGNOSIS — K8050 Calculus of bile duct without cholangitis or cholecystitis without obstruction: Secondary | ICD-10-CM | POA: Diagnosis not present

## 2022-02-24 LAB — CBC WITH DIFFERENTIAL/PLATELET
Abs Immature Granulocytes: 0.04 10*3/uL (ref 0.00–0.07)
Basophils Absolute: 0 10*3/uL (ref 0.0–0.1)
Basophils Relative: 0 %
Eosinophils Absolute: 0 10*3/uL (ref 0.0–0.5)
Eosinophils Relative: 0 %
HCT: 31.3 % — ABNORMAL LOW (ref 39.0–52.0)
Hemoglobin: 10.3 g/dL — ABNORMAL LOW (ref 13.0–17.0)
Immature Granulocytes: 0 %
Lymphocytes Relative: 9 %
Lymphs Abs: 0.9 10*3/uL (ref 0.7–4.0)
MCH: 32.7 pg (ref 26.0–34.0)
MCHC: 32.9 g/dL (ref 30.0–36.0)
MCV: 99.4 fL (ref 80.0–100.0)
Monocytes Absolute: 0.6 10*3/uL (ref 0.1–1.0)
Monocytes Relative: 6 %
Neutro Abs: 8 10*3/uL — ABNORMAL HIGH (ref 1.7–7.7)
Neutrophils Relative %: 85 %
Platelets: 169 10*3/uL (ref 150–400)
RBC: 3.15 MIL/uL — ABNORMAL LOW (ref 4.22–5.81)
RDW: 13.2 % (ref 11.5–15.5)
WBC: 9.6 10*3/uL (ref 4.0–10.5)
nRBC: 0 % (ref 0.0–0.2)

## 2022-02-24 LAB — COMPREHENSIVE METABOLIC PANEL
ALT: 83 U/L — ABNORMAL HIGH (ref 0–44)
AST: 45 U/L — ABNORMAL HIGH (ref 15–41)
Albumin: 2.8 g/dL — ABNORMAL LOW (ref 3.5–5.0)
Alkaline Phosphatase: 293 U/L — ABNORMAL HIGH (ref 38–126)
Anion gap: 7 (ref 5–15)
BUN: 21 mg/dL (ref 8–23)
CO2: 25 mmol/L (ref 22–32)
Calcium: 8.5 mg/dL — ABNORMAL LOW (ref 8.9–10.3)
Chloride: 106 mmol/L (ref 98–111)
Creatinine, Ser: 2.33 mg/dL — ABNORMAL HIGH (ref 0.61–1.24)
GFR, Estimated: 28 mL/min — ABNORMAL LOW (ref >60)
Glucose, Bld: 158 mg/dL — ABNORMAL HIGH (ref 70–99)
Potassium: 4.9 mmol/L (ref 3.5–5.1)
Sodium: 138 mmol/L (ref 135–145)
Total Bilirubin: 0.7 mg/dL (ref 0.3–1.2)
Total Protein: 5.9 g/dL — ABNORMAL LOW (ref 6.5–8.1)

## 2022-02-24 LAB — OSMOLALITY, URINE: Osmolality, Ur: 576 mOsm/kg (ref 300–900)

## 2022-02-24 LAB — MAGNESIUM: Magnesium: 2 mg/dL (ref 1.7–2.4)

## 2022-02-24 LAB — URIC ACID: Uric Acid, Serum: 6.2 mg/dL (ref 3.7–8.6)

## 2022-02-24 LAB — SODIUM, URINE, RANDOM: Sodium, Ur: 73 mmol/L

## 2022-02-24 LAB — OSMOLALITY: Osmolality: 296 mOsm/kg — ABNORMAL HIGH (ref 275–295)

## 2022-02-24 LAB — C-REACTIVE PROTEIN: CRP: 0.9 mg/dL (ref <1.0)

## 2022-02-24 LAB — BRAIN NATRIURETIC PEPTIDE: B Natriuretic Peptide: 61.3 pg/mL (ref 0.0–100.0)

## 2022-02-24 LAB — CREATININE, URINE, RANDOM: Creatinine, Urine: 198 mg/dL

## 2022-02-24 MED ORDER — TRAMADOL HCL 50 MG PO TABS
50.0000 mg | ORAL_TABLET | Freq: Four times a day (QID) | ORAL | 0 refills | Status: DC | PRN
  Filled 2022-02-24: qty 20, 5d supply, fill #0

## 2022-02-24 MED ORDER — POLYETHYLENE GLYCOL 3350 17 G PO PACK
17.0000 g | PACK | Freq: Two times a day (BID) | ORAL | Status: DC
  Administered 2022-02-24 – 2022-02-25 (×3): 17 g via ORAL
  Filled 2022-02-24 (×3): qty 1

## 2022-02-24 MED ORDER — KCL IN DEXTROSE-NACL 20-5-0.45 MEQ/L-%-% IV SOLN
INTRAVENOUS | Status: AC
  Filled 2022-02-24 (×2): qty 1000

## 2022-02-24 MED ORDER — ACETAMINOPHEN 325 MG PO TABS: 650.0000 mg | ORAL_TABLET | Freq: Four times a day (QID) | ORAL | Status: AC | PRN

## 2022-02-24 MED ORDER — HYDROCODONE-ACETAMINOPHEN 5-325 MG PO TABS
1.0000 | ORAL_TABLET | Freq: Four times a day (QID) | ORAL | Status: DC | PRN
  Administered 2022-02-24: 1 via ORAL
  Filled 2022-02-24: qty 1

## 2022-02-24 NOTE — Discharge Instructions (Addendum)
[facility] ______[facility] LAPAROSCOPIC SURGERY: POST OP INSTRUCTIONS Always review your discharge instruction sheet given to you by the facility where your surgery was performed. IF YOU HAVE DISABILITY OR FAMILY LEAVE FORMS, YOU MUST BRING THEM TO THE OFFICE FOR PROCESSING.   DO NOT GIVE THEM TO YOUR DOCTOR.  A prescription for pain medication may be given to you upon discharge.  Take your pain medication as prescribed, if needed.  If narcotic pain medicine is not needed, then you may take acetaminophen (Tylenol) or ibuprofen (Advil) as needed. Take your usually prescribed medications unless otherwise directed. If you need a refill on your pain medication, please contact your pharmacy.  They will contact our office to request authorization. Prescriptions will not be filled after 5pm or on week-ends. You should follow a light diet the first few days after arrival home, such as soup and crackers, etc.  Be sure to include lots of fluids daily. Most patients will experience some swelling and bruising in the area of the incisions.  Ice packs will help.  Swelling and bruising can take several days to resolve.  It is common to experience some constipation if taking pain medication after surgery.  Increasing fluid intake and taking a stool softener (such as Colace) will usually help or prevent this problem from occurring.  A mild laxative (Milk of Magnesia or Miralax) should be taken according to package instructions if there are no bowel movements after 48 hours. Unless discharge instructions indicate otherwise, you may remove your bandages 24-48 hours after surgery, and you may shower at that time.  You may have steri-strips (small skin tapes) in place directly over the incision.  These strips should be left on the skin for 7-10 days.  If your surgeon used skin glue on the incision, you may shower in 24 hours.  The glue will flake off over the next 2-3 weeks.  Any sutures or staples will be  removed at the office during your follow-up visit. ACTIVITIES:  You may resume regular (light) daily activities beginning the next day--such as daily self-care, walking, climbing stairs--gradually increasing activities as tolerated.  You may have sexual intercourse when it is comfortable.  Refrain from any heavy lifting or straining until approved by your doctor. You may drive when you are no longer taking prescription pain medication, you can comfortably wear a seatbelt, and you can safely maneuver your car and apply brakes. RETURN TO WORK:  __________________________________________________________ Dennis Bast should see your doctor in the office for a follow-up appointment approximately 2-3 weeks after your surgery.  Make sure that you call for this appointment within a day or two after you arrive home to insure a convenient appointment time. OTHER INSTRUCTIONS: __________________________________________________________________________________________________________________________ __________________________________________________________________________________________________________________________ WHEN TO CALL YOUR DOCTOR: Fever over 101.0 Inability to urinate Continued bleeding from incision. Increased pain, redness, or drainage from the incision. Increasing abdominal pain  The clinic staff is available to answer your questions during regular business hours.  Please don't hesitate to call and ask to speak to one of the nurses for clinical concerns.  If you have a medical emergency, go to the nearest emergency room or call 911.  A surgeon from [facility] is always on call at the hospital. [address], [place], [place], [place]  [address] ? [address], [place], [place]   [address] [phone] ? [phone] ? FAX [phone] Web site: [email or web address]   Follow with Primary MD Carollee Herter, Alferd Apa, DO in 7 days  Get CBC, CMP, Magnesium -  checked next  visit within 1 week by Primary MD   Activity: As tolerated with Full fall precautions use walker/cane & assistance as needed  Disposition Home    Diet: Heart Healthy   Special Instructions: If you have smoked or chewed Tobacco  in the last 2 yrs please stop smoking, stop any regular Alcohol  and or any Recreational drug use.  On your next visit with your primary care physician please Get Medicines reviewed and adjusted.  Please request your Prim.MD to go over all Hospital Tests and Procedure/Radiological results at the follow up, please get all Hospital records sent to your Prim MD by signing hospital release before you go home.  If you experience worsening of your admission symptoms, develop shortness of breath, life threatening emergency, suicidal or homicidal thoughts you must seek medical attention immediately by calling 911 or calling your MD immediately  if symptoms less severe.  You Must read complete instructions/literature along with all the possible adverse reactions/side effects for all the Medicines you take and that have been prescribed to you. Take any new Medicines after you have completely understood and accpet all the possible adverse reactions/side effects.

## 2022-02-24 NOTE — Progress Notes (Signed)
PROGRESS NOTE                                                                                                                                                                                                             Patient Demographics:    William [name], is a 81 y.o. male, DOB - 1940-12-20, UUV:[id number]  Outpatient Primary MD for the patient is Ann Held, DO    LOS - 5  Admit date - 02/19/2022    Chief Complaint  Patient presents with   Abdominal Pain   Emesis   Nausea       Brief Narrative (HPI from H&P)   81 y.o. male with medical history significant of BPH; stage 3 CKD; h/o CVA; HTN; HLD; and mild vascular dementia presenting with abdominal pain.  He reports that he vomited this AM.  He had abdominal pain last night.  This AM, his emesis was dark with specks in it, wife thought bilious.  Him to the ER where work-up was consistent with acute cholecystitis, choledocholithiasis and he was admitted to the hospital.   Subjective:   Patient in bed, appears comfortable, denies any headache, no fever, no chest pain or pressure, no shortness of breath , no abdominal pain. No new focal weakness.   Assessment  & Plan :   Acute cholecystitis in the setting of choledocholithiasis.  He was treated with IV fluids IV antibiotics, seen by general surgery and GI.  MRCP was noted.  He is on Plavix which is on hold since admission on 02/19/2022, due for ERCP by GI on 02/22/2022 followed by laparoscopic cholecystectomy by general surgery on 02/23/2022.  Sepsis pathophysiology has completely resolved tolerating diet advance to soft, stop antibiotics.  Advance activity and prepare for discharge.  History of CVA with vascular dementia.  Plavix on hold due to #1 above, at risk for delirium, continue to monitor with supportive care.  Minimize narcotics and benzodiazepines.  Resume Plavix from 02/25/2022.  Hypertension.  On Norvasc and  as needed hydralazine.  BPH.  Continue Flomax.  AKI on stage IIIb CKD.  Gently hydrate, check urine electrolytes, monitor bladder scans.      Condition - Extremely Guarded  Family Communication  : wife Demetruis Depaul [phone] bedside on 02/20/22, 02/22/2022, 02/23/22  Code Status : DNR   Consults  : GI,  general surgery  PUD Prophylaxis : PPI   Procedures  :     MRCP -  Motion degraded images. Cholelithiasis, without associated inflammatory changes to suggest acute cholecystitis. Choledocholithiasis with multiple mid/distal CBD stones measuring up to 6 mm. ERCP is suggested. No peripancreatic fluid collection/pseudocyst in this patient with reported history of acute pancreatitis.      Disposition Plan  :    Status is: Inpatient  DVT Prophylaxis  :  Heparin  heparin injection 5,000 Units Start: 02/22/22 2200 SCDs Start: 02/19/22 1817    Lab Results  Component Value Date   PLT 169 02/24/2022    Diet :  Diet Order             DIET SOFT Room service appropriate? Yes; Fluid consistency: Thin  Diet effective now                    Inpatient Medications  Scheduled Meds:  amantadine  100 mg Oral Daily   amLODipine  10 mg Oral q morning   carBAMazepine  100 mg Oral BID   carBAMazepine  200 mg Oral QHS   docusate sodium  100 mg Oral BID   heparin injection (subcutaneous)  5,000 Units Subcutaneous Q8H   influenza vaccine adjuvanted  0.5 mL Intramuscular Tomorrow-1000   LORazepam  0.25 mg Oral Daily   LORazepam  1 mg Oral QHS   pantoprazole  40 mg Oral Daily   polyethylene glycol  17 g Oral BID   potassium chloride  20 mEq Oral Once   tamsulosin  0.4 mg Oral QPC supper   traZODone  75 mg Oral QHS   Continuous Infusions:  dextrose 5 % and 0.45 % NaCl with KCl 20 mEq/L     PRN Meds:.acetaminophen **OR** acetaminophen, haloperidol lactate, hydrALAZINE, HYDROcodone-acetaminophen, LORazepam, ondansetron **OR** ondansetron (ZOFRAN) IV, simethicone, traMADol      Time Spent in minutes  30   Lala Lund M.D on 02/24/2022 at 10:13 AM  To page go to [email or web address]   [facility] -  Office  [phone]  See all Orders from today for further details    Objective:   Vitals:   02/23/22 2037 02/23/22 2344 02/24/22 0439 02/24/22 0812  BP: (!) 160/74 (!) 167/71 (!) 158/63 (!) 161/80  Pulse: 63 71 65 68  Resp: '17 15 18 18  '$ Temp: 98 F (36.7 C) 98.5 F (36.9 C) 98 F (36.7 C) 97.7 F (36.5 C)  TempSrc: Oral Oral  Oral  SpO2: 94% 93% 100% 97%  Weight:      Height:        Wt Readings from Last 3 Encounters:  02/19/22 88.9 kg  01/04/22 87.6 kg  12/31/21 87.5 kg     Intake/Output Summary (Last 24 hours) at 02/24/2022 1013 Last data filed at 02/24/2022 0718 Gross per 24 hour  Intake 1546.03 ml  Output 320 ml  Net 1226.03 ml     Physical Exam  Awake Alert, No new F.N deficits, Normal affect .AT,PERRAL Supple Neck, No JVD,   Symmetrical Chest wall movement, Good air movement bilaterally, CTAB RRR,No Gallops, Rubs or new Murmurs,  +ve B.Sounds, Abd Soft, No tenderness,   No Cyanosis, Clubbing or edema        Data Review:    CBC Recent Labs  Lab 02/19/22 1118 02/20/22 0742 02/21/22 0142 02/22/22 0835 02/23/22 0328 02/24/22 0352  WBC 7.8 5.4 5.5 5.2 7.0 9.6  HGB 11.8* 9.9* 9.3* 10.8* 9.7* 10.3*  HCT 35.2* 29.4*  27.2* 31.6* 29.6* 31.3*  PLT 209 161 154 172 175 169  MCV 97.8 98.0 96.8 96.3 97.7 99.4  MCH 32.8 33.0 33.1 32.9 32.0 32.7  MCHC 33.5 33.7 34.2 34.2 32.8 32.9  RDW 13.4 13.5 13.5 13.2 13.2 13.2  LYMPHSABS 0.7  --  2.0 1.4 1.6 0.9  MONOABS 0.8  --  0.6 0.5 0.7 0.6  EOSABS 0.0  --  0.2 0.2 0.1 0.0  BASOSABS 0.0  --  0.0 0.0 0.0 0.0    Electrolytes Recent Labs  Lab 02/20/22 0742 02/21/22 0142 02/22/22 0835 02/23/22 0328 02/24/22 0352  NA 139 139 139 138 138  K 3.9 3.8 3.5 3.6 4.9  CL 108 107 104 105 106  CO2 '23 25 27 26 25  '$ GLUCOSE 103* 95 109* 128* 158*  BUN 26* '18 12 19 21  '$ CREATININE  1.64* 1.67* 1.65* 2.52* 2.33*  CALCIUM 8.7* 8.6* 9.0 8.7* 8.5*  AST 125* 62* 34 25 45*  ALT 187* 136* 101* 76* 83*  ALKPHOS 417* 380* 367* 295* 293*  BILITOT 0.8 0.7 0.6 0.7 0.7  ALBUMIN 2.8* 2.7* 3.1* 2.8* 2.8*  MG 1.9 1.8 1.8 1.8 2.0  CRP 1.2* 1.4* 0.7 0.7 0.9  INR 1.1  --   --   --   --   BNP  --  38.3 61.6 49.0 61.3    ------------------------------------------------------------------------------------------------------------------ No results for input(s): "CHOL", "HDL", "LDLCALC", "TRIG", "CHOLHDL", "LDLDIRECT" in the last 72 hours.  Lab Results  Component Value Date   HGBA1C 5.3 07/27/2021    Micro Results No results found for this or any previous visit (from the past 240 hour(s)).  Radiology Reports DG ERCP  Result Date: 02/22/2022 CLINICAL DATA:  Cholelithiasis, choledocholithiasis, pancreatitis and biliary dilatation. EXAM: ERCP TECHNIQUE: Multiple spot images obtained with the fluoroscopic device and submitted for interpretation post-procedure. COMPARISON:  MRI/MRCP on 02/20/2022 and right upper quadrant ultrasound on 02/19/2022 FINDINGS: Images submitted and obtained with a C-arm demonstrate initial cholangiogram showing discrete filling defects within the distal aspect of a dilated common bile duct. There may be some filling defects in the cystic duct. Balloon sweep maneuver was performed with completion cholangiogram showing no further filling defects in the common bile duct. IMPRESSION: ERCP images demonstrate evidence of choledocholithiasis in the distal common bile duct with multiple filling defects noted consistent with calculi. Balloon sweep maneuver was performed to remove calculi. There may also be some filling defects in the cystic duct. These images were submitted for radiologic interpretation only. Please see the procedural report for the amount of contrast and the fluoroscopy time utilized. Electronically Signed   By: Aletta Edouard M.D.   On: 02/22/2022 13:50

## 2022-02-24 NOTE — Care Management Important Message (Signed)
Important Message  Patient Details  Name: William [name] MRN: [id number] Date of Birth: 1941/02/01   Medicare Important Message Given:  Yes     Orbie Pyo 02/24/2022, 1:57 PM

## 2022-02-24 NOTE — Progress Notes (Signed)
Occupational Therapy Treatment Patient Details Name: William [name] MRN: [id number] DOB: 01-27-1941 Today's Date: 02/24/2022   History of present illness 81 yo male with onset of abd pain and emesis was admitted on 9/8.  Pti has acute cholecystitis and choledocholithiasis, with plan to avoid invasive tx given his stroke history.  PMHx:  stroke with L  hemiparesis, CKD3, HTN, vascular dementia   OT comments  Pt with slow progression towards goals, needing mod A for ADLs, bed mobility, and transfers with RW. Pt with difficulty motor planning/sequencing tasks needing mod verbal and ultimately physical assist to sit up at EOB. Pt presenting with impairments listed below, will follow acutely. Updating d/c recommendation to SNF.   Recommendations for follow up therapy are one component of a multi-disciplinary discharge planning process, led by the attending physician.  Recommendations may be updated based on patient status, additional functional criteria and insurance authorization.    Follow Up Recommendations  Skilled nursing-short term rehab (<3 hours/day)    Assistance Recommended at Discharge Frequent or constant Supervision/Assistance  Patient can return home with the following  A lot of help with walking and/or transfers;A lot of help with bathing/dressing/bathroom;Assistance with cooking/housework;Direct supervision/assist for medications management;Direct supervision/assist for financial management;Assist for transportation;Help with stairs or ramp for entrance   Equipment Recommendations  None recommended by OT;Other (comment) (defer)    Recommendations for Other Services PT consult    Precautions / Restrictions Precautions Precautions: Fall Precaution Comments: L hemiparesis Restrictions Weight Bearing Restrictions: No Other Position/Activity Restrictions: pt struggles with walker and SPC, leans Rt significantly       Mobility Bed Mobility Overal bed mobility: Needs  Assistance Bed Mobility: Rolling, Sidelying to Sit, Sit to Sidelying Rolling: Mod assist Sidelying to sit: Mod assist            Transfers Overall transfer level: Needs assistance Equipment used: Rolling walker (2 wheels) Transfers: Sit to/from Stand Sit to Stand: Mod assist                 Balance Overall balance assessment: Needs assistance Sitting-balance support: Feet supported Sitting balance-Leahy Scale: Fair     Standing balance support: Single extremity supported Standing balance-Leahy Scale: Poor                             ADL either performed or assessed with clinical judgement   ADL Overall ADL's : Needs assistance/impaired                         Toilet Transfer: Moderate assistance;Ambulation;Rolling walker (2 wheels) Toilet Transfer Details (indicate cue type and reason): mod A for cues and sequencing         Functional mobility during ADLs: Minimal assistance;Rolling walker (2 wheels)      Extremity/Trunk Assessment Upper Extremity Assessment Upper Extremity Assessment: LUE deficits/detail LUE Deficits / Details: dislocated L clavicle, proximal vs distal weakness from prior CVA LUE Coordination: decreased gross motor;decreased fine motor   Lower Extremity Assessment Lower Extremity Assessment: Defer to PT evaluation        Vision   Vision Assessment?: No apparent visual deficits   Perception Perception Perception: Not tested   Praxis Praxis Praxis: Impaired Praxis Impairment Details: Motor planning;Initiation    Cognition Arousal/Alertness: Awake/alert Behavior During Therapy: Flat affect, Impulsive Overall Cognitive Status: History of cognitive impairments - at baseline  General Comments: needing step by step verbal and tactile cues to complete        Exercises      Shoulder Instructions       General Comments VSS on RA    Pertinent Vitals/  Pain       Pain Assessment Pain Assessment: No/denies pain  Home Living                                          Prior Functioning/Environment              Frequency  Min 2X/week        Progress Toward Goals  OT Goals(current goals can now be found in the care plan section)  Progress towards OT goals: Progressing toward goals  Acute Rehab OT Goals Patient Stated Goal: none stated OT Goal Formulation: With patient Time For Goal Achievement: 03/06/22 Potential to Achieve Goals: Good ADL Goals Pt Will Perform Grooming: with min assist;standing Pt Will Perform Upper Body Dressing: with min assist;standing Pt Will Perform Lower Body Dressing: with min assist;sit to/from stand;sitting/lateral leans Pt Will Transfer to Toilet: with min assist;regular height toilet;ambulating Additional ADL Goal #1: pt will complete bed mobility min A in prep for ADLs  Plan Frequency remains appropriate;Discharge plan needs to be updated    Co-evaluation                 AM-PAC OT "6 Clicks" Daily Activity     Outcome Measure   Help from another person eating meals?: None Help from another person taking care of personal grooming?: A Little Help from another person toileting, which includes using toliet, bedpan, or urinal?: Total Help from another person bathing (including washing, rinsing, drying)?: A Lot Help from another person to put on and taking off regular upper body clothing?: A Lot Help from another person to put on and taking off regular lower body clothing?: A Lot 6 Click Score: 14    End of Session Equipment Utilized During Treatment: Gait belt;Rolling walker (2 wheels)  OT Visit Diagnosis: Unsteadiness on feet (R26.81);Other abnormalities of gait and mobility (R26.89);Muscle weakness (generalized) (M62.81)   Activity Tolerance Patient tolerated treatment well   Patient Left in chair;with call bell/phone within reach;with chair alarm set   Nurse  Communication Mobility status        Time: 1093-2355 OT Time Calculation (min): 21 min  Charges: OT General Charges $OT Visit: 1 Visit OT Treatments $Therapeutic Activity: 8-22 mins  Lynnda Child, OTD, OTR/L Acute Rehab [phone] - [place] 02/24/2022, 4:00 PM

## 2022-02-24 NOTE — Progress Notes (Signed)
Mobility Specialist Progress Note:   02/24/22 1230  Mobility  Activity Ambulated with assistance in hallway  Level of Assistance Contact guard assist, steadying assist  Assistive Device Front wheel walker  Distance Ambulated (ft) 250 ft  Activity Response Tolerated fair  $Mobility charge 1 Mobility   Pt received in bed and agreeable. Took 1x standing break d/t fatigue. No complaints of pain or SOB. Pt left in chair with RN in room and all needs met.   Georgena Weisheit Mobility Specialist-Acute Rehab Secure Chat only

## 2022-02-24 NOTE — Progress Notes (Addendum)
Progress Note  1 Day Post-Op  Subjective: Has only had clears since surgery. Tolerating well - no nausea. Passing flatus. No BM for several days. Having expected post op abdominal pain  Wife is bedside  Objective: Vital signs in last 24 hours: Temp:  [97.7 F (36.5 C)-98.5 F (36.9 C)] 97.7 F (36.5 C) (09/13 0812) Pulse Rate:  [58-76] 68 (09/13 0812) Resp:  [15-18] 18 (09/13 0812) BP: (119-175)/(38-80) 161/80 (09/13 0812) SpO2:  [92 %-100 %] 97 % (09/13 0812) Last BM Date : 02/21/22  Intake/Output from previous day: 09/12 0701 - 09/13 0700 In: 792.3 [I.V.:792.3] Out: 320 [Urine:300; Blood:20] Intake/Output this shift: Total I/O In: 753.8 [I.V.:382.1; IV Piggyback:371.7] Out: -   PE: General: pleasant, WD, male who is laying in bed in NAD Lungs: Respiratory effort nonlabored Abd: soft, ND, +BS. Incision c/d/I with glue. Expected post op TTP MSK: all 4 extremities are symmetrical with no cyanosis, clubbing, or edema. Skin: warm and dry Psych: A&Ox3 with an appropriate affect.    Lab Results:  Recent Labs    02/23/22 0328 02/24/22 0352  WBC 7.0 9.6  HGB 9.7* 10.3*  HCT 29.6* 31.3*  PLT 175 169    BMET Recent Labs    02/23/22 0328 02/24/22 0352  NA 138 138  K 3.6 4.9  CL 105 106  CO2 26 25  GLUCOSE 128* 158*  BUN 19 21  CREATININE 2.52* 2.33*  CALCIUM 8.7* 8.5*    PT/INR No results for input(s): "LABPROT", "INR" in the last 72 hours.  CMP     Component Value Date/Time   NA 138 02/24/2022 0352   NA 142 04/25/2019 1000   K 4.9 02/24/2022 0352   CL 106 02/24/2022 0352   CO2 25 02/24/2022 0352   GLUCOSE 158 (H) 02/24/2022 0352   BUN 21 02/24/2022 0352   BUN 30 (H) 04/25/2019 1000   CREATININE 2.33 (H) 02/24/2022 0352   CREATININE 1.71 (H) 12/25/2021 1433   CALCIUM 8.5 (L) 02/24/2022 0352   PROT 5.9 (L) 02/24/2022 0352   PROT 7.4 04/25/2019 1000   ALBUMIN 2.8 (L) 02/24/2022 0352   ALBUMIN 4.2 04/25/2019 1000   AST 45 (H) 02/24/2022 0352    ALT 83 (H) 02/24/2022 0352   ALKPHOS 293 (H) 02/24/2022 0352   BILITOT 0.7 02/24/2022 0352   BILITOT 0.3 04/25/2019 1000   GFRNONAA 28 (L) 02/24/2022 0352   GFRAA 34 (L) 04/25/2019 1000   Lipase     Component Value Date/Time   LIPASE 62 (H) 02/21/2022 0142       Studies/Results: DG ERCP  Result Date: 02/22/2022 CLINICAL DATA:  Cholelithiasis, choledocholithiasis, pancreatitis and biliary dilatation. EXAM: ERCP TECHNIQUE: Multiple spot images obtained with the fluoroscopic device and submitted for interpretation post-procedure. COMPARISON:  MRI/MRCP on 02/20/2022 and right upper quadrant ultrasound on 02/19/2022 FINDINGS: Images submitted and obtained with a C-arm demonstrate initial cholangiogram showing discrete filling defects within the distal aspect of a dilated common bile duct. There may be some filling defects in the cystic duct. Balloon sweep maneuver was performed with completion cholangiogram showing no further filling defects in the common bile duct. IMPRESSION: ERCP images demonstrate evidence of choledocholithiasis in the distal common bile duct with multiple filling defects noted consistent with calculi. Balloon sweep maneuver was performed to remove calculi. There may also be some filling defects in the cystic duct. These images were submitted for radiologic interpretation only. Please see the procedural report for the amount of contrast and the fluoroscopy time  utilized. Electronically Signed   By: Aletta Edouard M.D.   On: 02/22/2022 13:50    Anti-infectives: Anti-infectives (From admission, onward)    Start     Dose/Rate Route Frequency Ordered Stop   02/20/22 0000  piperacillin-tazobactam (ZOSYN) IVPB 3.375 g        3.375 g 12.5 mL/hr over 240 Minutes Intravenous Every 8 hours 02/19/22 1833     02/19/22 1615  piperacillin-tazobactam (ZOSYN) IVPB 3.375 g        3.375 g 100 mL/hr over 30 Minutes Intravenous  Once 02/19/22 1601 02/19/22 1831         Assessment/Plan  Choledocholithiasis POD1 lap chole Dr. Thermon Leyland 9/12 -MRCP with cholelithiasis and multiple mid to distal CBD stones.  Pancreas grossly unremarkable in setting of elevated lipase -ERCP 9/11. Stones removed and sphincterotomy performed - doing well postoperatively. Advanced to soft by medicine and agree. If tolerating diet can discharge home as early as today from surgical standpoint. Will send pain meds. Have started bowel regimen and discussed with wife continuing this at dc -Continue to hold Plavix - recommend restarting tomorrow (48 hours from surgery)  FEN: soft PN:TIRWE VTE: plavix held (9/7 0900), hep subq   Per primary: CVA w/ left sided hemiplegia (2006, 07/2021) - on plavix HTN HLD Carotid artery stenosis s/p b/l CEA CKD stage IV Remote hx of bladder cancer s/p TURP  GERD     LOS: 5 days   Winferd Humphrey, [facility] 02/24/2022, 8:40 AM Please see Amion for pager number during day hours 7:00am-4:30pm

## 2022-02-25 ENCOUNTER — Ambulatory Visit: Payer: PPO | Admitting: Occupational Therapy

## 2022-02-25 ENCOUNTER — Ambulatory Visit: Payer: PPO | Admitting: Physical Therapy

## 2022-02-25 DIAGNOSIS — K8050 Calculus of bile duct without cholangitis or cholecystitis without obstruction: Secondary | ICD-10-CM | POA: Diagnosis not present

## 2022-02-25 LAB — COMPREHENSIVE METABOLIC PANEL
ALT: 57 U/L — ABNORMAL HIGH (ref 0–44)
AST: 25 U/L (ref 15–41)
Albumin: 2.6 g/dL — ABNORMAL LOW (ref 3.5–5.0)
Alkaline Phosphatase: 224 U/L — ABNORMAL HIGH (ref 38–126)
Anion gap: 8 (ref 5–15)
BUN: 19 mg/dL (ref 8–23)
CO2: 25 mmol/L (ref 22–32)
Calcium: 8.4 mg/dL — ABNORMAL LOW (ref 8.9–10.3)
Chloride: 104 mmol/L (ref 98–111)
Creatinine, Ser: 1.96 mg/dL — ABNORMAL HIGH (ref 0.61–1.24)
GFR, Estimated: 34 mL/min — ABNORMAL LOW (ref >60)
Glucose, Bld: 115 mg/dL — ABNORMAL HIGH (ref 70–99)
Potassium: 4.2 mmol/L (ref 3.5–5.1)
Sodium: 137 mmol/L (ref 135–145)
Total Bilirubin: 0.4 mg/dL (ref 0.3–1.2)
Total Protein: 5.7 g/dL — ABNORMAL LOW (ref 6.5–8.1)

## 2022-02-25 LAB — CBC WITH DIFFERENTIAL/PLATELET
Abs Immature Granulocytes: 0.02 10*3/uL (ref 0.00–0.07)
Basophils Absolute: 0 10*3/uL (ref 0.0–0.1)
Basophils Relative: 1 %
Eosinophils Absolute: 0.3 10*3/uL (ref 0.0–0.5)
Eosinophils Relative: 3 %
HCT: 27.7 % — ABNORMAL LOW (ref 39.0–52.0)
Hemoglobin: 9.3 g/dL — ABNORMAL LOW (ref 13.0–17.0)
Immature Granulocytes: 0 %
Lymphocytes Relative: 18 %
Lymphs Abs: 1.5 10*3/uL (ref 0.7–4.0)
MCH: 33.3 pg (ref 26.0–34.0)
MCHC: 33.6 g/dL (ref 30.0–36.0)
MCV: 99.3 fL (ref 80.0–100.0)
Monocytes Absolute: 1.1 10*3/uL — ABNORMAL HIGH (ref 0.1–1.0)
Monocytes Relative: 13 %
Neutro Abs: 5.5 10*3/uL (ref 1.7–7.7)
Neutrophils Relative %: 65 %
Platelets: 154 10*3/uL (ref 150–400)
RBC: 2.79 MIL/uL — ABNORMAL LOW (ref 4.22–5.81)
RDW: 13.2 % (ref 11.5–15.5)
WBC: 8.4 10*3/uL (ref 4.0–10.5)
nRBC: 0 % (ref 0.0–0.2)

## 2022-02-25 LAB — SURGICAL PATHOLOGY

## 2022-02-25 NOTE — Progress Notes (Signed)
Follow up scheduled and pain medications sent to pharmacy. Post op instructions discussed with patient and wife yesterday. Please contact us if we can be of further assistance during hospitalization

## 2022-02-25 NOTE — Plan of Care (Signed)

## 2022-02-25 NOTE — Discharge Summary (Signed)
COYT GOVONI TZG:[id number] DOB: 06-20-1940 DOA: 02/19/2022  PCP: Ann Held, DO  Admit date: 02/19/2022  Discharge date: 02/25/2022  Admitted From: Home   Disposition:  Home   Recommendations for Outpatient Follow-up:   Follow up with PCP in 1-2 weeks  PCP Please obtain BMP/CBC, 2 view CXR in 1week,  (see Discharge instructions)   PCP Please follow up on the following pending results: Please arrange for outpatient follow-up with his gastroenterologist Dr. Benson Norway in 1 to [redacted] weeks along with general surgery in 1 to 2 weeks.  Monitor CBC, CMP and magnesium levels next visit.   Home Health: PT, RN, adie   Equipment/Devices: as below  Consultations: GI, CCS Discharge Condition: Stable    CODE STATUS: Full    Diet Recommendation: Heart Healthy   Diet Order             Diet - low sodium heart healthy           DIET SOFT Room service appropriate? Yes; Fluid consistency: Thin  Diet effective now                    Chief Complaint  Patient presents with   Abdominal Pain   Emesis   Nausea     Brief history of present illness from the day of admission and additional interim summary    81 y.o. male with medical history significant of BPH; stage 3 CKD; h/o CVA; HTN; HLD; and mild vascular dementia presenting with abdominal pain.  He reports that he vomited this AM.  He had abdominal pain last night.  This AM, his emesis was dark with specks in it, wife thought bilious.  Him to the ER where work-up was consistent with acute cholecystitis, choledocholithiasis and he was admitted to the hospital.                                                                   Hospital Course    Acute cholecystitis in the setting of choledocholithiasis.  He was treated with IV fluids IV antibiotics, seen by general  surgery and GI.  MRCP was noted.  He is on Plavix which is on hold since admission on 02/19/2022, due for ERCP by GI on 02/22/2022 followed by laparoscopic cholecystectomy by general surgery on 02/23/2022.  Sepsis pathophysiology has completely resolved tolerating diet advance to soft, stop antibiotics.  Stable now tolerating soft diet and symptom-free per general surgery cleared for discharge, will discharge the patient with outpatient general surgery and PCP follow-up.   History of CVA with vascular dementia.  Plavix on hold due to #1 above, at risk for delirium, continue to monitor with supportive care.  Minimize narcotics and benzodiazepines.  Resume Plavix from 02/25/2022.   Hypertension.  home regimen.  BPH.  Continue Flomax.   AKI on stage IIIb CKD.  Was due to dehydration much improved after IV fluids, PCP to monitor.     Discharge diagnosis     Principal Problem:   Choledocholithiasis Active Problems:   BPH (benign prostatic hyperplasia)   History of right MCA infarct   Major vascular neurocognitive disorder   HTN (hypertension)   Chronic kidney disease (CKD), stage IV (severe) (HCC)   DNR (do not resuscitate)    Discharge instructions    Discharge Instructions     Diet - low sodium heart healthy   Complete by: As directed    Discharge instructions   Complete by: As directed    Follow with Primary MD Carollee Herter, Alferd Apa, DO in 7 days   Get CBC, CMP, Magnesium -  checked next visit within 1 week by Primary MD   Activity: As tolerated with Full fall precautions use walker/cane & assistance as needed  Disposition Home    Diet: Heart Healthy   Special Instructions: If you have smoked or chewed Tobacco  in the last 2 yrs please stop smoking, stop any regular Alcohol  and or any Recreational drug use.  On your next visit with your primary care physician please Get Medicines reviewed and adjusted.  Please request your Prim.MD to go over all Hospital Tests and  Procedure/Radiological results at the follow up, please get all Hospital records sent to your Prim MD by signing hospital release before you go home.  If you experience worsening of your admission symptoms, develop shortness of breath, life threatening emergency, suicidal or homicidal thoughts you must seek medical attention immediately by calling 911 or calling your MD immediately  if symptoms less severe.  You Must read complete instructions/literature along with all the possible adverse reactions/side effects for all the Medicines you take and that have been prescribed to you. Take any new Medicines after you have completely understood and accpet all the possible adverse reactions/side effects.   Increase activity slowly   Complete by: As directed    No wound care   Complete by: As directed        Discharge Medications   Allergies as of 02/25/2022       Reactions   Bee Venom Anaphylaxis   Strawberry Extract Hives   Latex Itching   Zetia [ezetimibe] Other (See Comments)   Intolerance   Adhesive [tape] Other (See Comments)   BLISTER   Statins Other (See Comments)   myalgias        Medication List     TAKE these medications    acetaminophen 325 MG tablet Commonly known as: TYLENOL Take 2 tablets (650 mg total) by mouth every 6 (six) hours as needed for mild pain.   Adult One Daily Gummies Chew Chew 1 tablet by mouth every morning.   amantadine 100 MG capsule Commonly known as: SYMMETREL Take 1 capsule (100 mg total) by mouth daily.   amLODipine 10 MG tablet Commonly known as: NORVASC Take 1 tablet (10 mg total) by mouth every morning.   carBAMazepine 100 MG/5ML suspension Commonly known as: TEGretol Take 5 mLs (100 mg total) by mouth 3 (three) times daily. What changed:  how much to take additional instructions   clopidogrel 75 MG tablet Commonly known as: PLAVIX Take 1 tablet (75 mg total) by mouth daily.   clotrimazole-betamethasone cream Commonly known  as: LOTRISONE Apply 1 Application on to the skin daily. What changed:  when to take this reasons  to take this   ferrous sulfate 325 (65 FE) MG EC tablet Take 325 mg by mouth daily with breakfast.   fluticasone 0.05 % cream Commonly known as: CUTIVATE 1 application daily as needed (psoriasis).   hydrALAZINE 25 MG tablet Commonly known as: APRESOLINE Take 1 tablet (25 mg total) by mouth every 8 (eight) hours.   hydrOXYzine 25 MG capsule Commonly known as: Vistaril Take 1 capsule by mouth everyday at 3:30 PM as needed What changed:  how much to take how to take this when to take this reasons to take this additional instructions   Loreev XR 1 MG Cs24 Generic drug: LORazepam ER Take 1 mg (1 capsule) by mouth at bedtime.   LORazepam 0.5 MG tablet Commonly known as: ATIVAN Take 1/2 tablet (0.25 mg total) by mouth daily.   pantoprazole 40 MG tablet Commonly known as: PROTONIX Take 1 tablet (40 mg total) by mouth daily.   polyethylene glycol 17 g packet Commonly known as: MIRALAX / GLYCOLAX Take 17 g by mouth daily.   tamsulosin 0.4 MG Caps capsule Commonly known as: FLOMAX Take 1 capsule (0.4 mg total) by mouth daily after supper.   traMADol 50 MG tablet Commonly known as: ULTRAM Take 1 tablet (50 mg total) by mouth every 6 (six) hours as needed for up to 5 days for moderate pain or severe pain. What changed:  when to take this reasons to take this   traZODone 150 MG tablet Commonly known as: DESYREL Take 1/2 tablet (75 mg total) by mouth at bedtime.   triamcinolone cream 0.1 % Commonly known as: KENALOG Apply 1 application topically 2 (two) times daily. What changed:  when to take this reasons to take this   Vitamin D3 25 MCG (1000 UT) Chew Chew 1 tablet by mouth daily.         Follow-up [place] Follow up.   Why: [facility] will be providing home health services including PT/OT/  aide. Contact information: [place] [address] [place] Follow up.   Contact information: [phone]        Maczis, Carlena Hurl, PA-C. Call.   Specialty: General Surgery Why: follow up on 03/18/22 8:30 am. Please arrive 30 minutes early to complete check in process and bring photo ID and insurance card if you have them Contact information: [place] [address] [phone]         Lowne Chase, Alferd Apa, DO. Schedule an appointment as soon as possible for a visit in 1 week(s).   Specialty: Family Medicine Contact information: [address] [place] [address] [phone]                 Major procedures and Radiology Reports - PLEASE review detailed and final reports thoroughly  -        DG ERCP  Result Date: 02/22/2022 CLINICAL DATA:  Cholelithiasis, choledocholithiasis, pancreatitis and biliary dilatation. EXAM: ERCP TECHNIQUE: Multiple spot images obtained with the fluoroscopic device and submitted for interpretation post-procedure. COMPARISON:  MRI/MRCP on 02/20/2022 and right upper quadrant ultrasound on 02/19/2022 FINDINGS: Images submitted and obtained with a C-arm demonstrate initial cholangiogram showing discrete filling defects within the distal aspect of a dilated common bile duct. There may be some filling defects in the cystic duct. Balloon sweep maneuver was performed with  completion cholangiogram showing no further filling defects in the common bile duct. IMPRESSION: ERCP images demonstrate evidence of choledocholithiasis in the distal common bile duct with multiple filling defects noted consistent with calculi. Balloon sweep maneuver was performed to remove calculi. There may also be some filling defects in the cystic duct. These images were submitted for radiologic interpretation only. Please see the procedural report for the amount of  contrast and the fluoroscopy time utilized. Electronically Signed   By: Aletta Edouard M.D.   On: 02/22/2022 13:50   MR ABDOMEN MRCP W WO CONTAST  Result Date: 02/20/2022 CLINICAL DATA:  Cholelithiasis, evaluate for choledocholithiasis EXAM: MRI ABDOMEN WITHOUT AND WITH CONTRAST (INCLUDING MRCP) TECHNIQUE: Multiplanar multisequence MR imaging of the abdomen was performed both before and after the administration of intravenous contrast. Heavily T2-weighted images of the biliary and pancreatic ducts were obtained, and three-dimensional MRCP images were rendered by post processing. CONTRAST:  25m GADAVIST GADOBUTROL 1 MMOL/ML IV SOLN COMPARISON:  Right upper quadrant ultrasound dated 02/19/2022. CT abdomen/pelvis dated 10/02/2021. FINDINGS: Motion degraded images. Lower chest: Lung bases are clear. Hepatobiliary: Liver is unremarkable. Large gallstones, without associated inflammatory changes to suggest acute cholecystitis. No intrahepatic ductal dilatation. Dilated common duct, measuring 8 mm. Multiple mid/distal CBD stones measuring up to 6 mm (series 7/image 20). Pancreas: Grossly unremarkable in this patient with history of acute pancreatitis. No peripancreatic fluid collection/pseudocyst. Spleen:  Within normal limits. Adrenals/Urinary Tract:  Adrenal glands are within normal limits. Bilateral renal cysts, including a dominant 6.1 cm right renal sinus cyst (series 8/image 23), benign (Bosniak I). No hydronephrosis. Stomach/Bowel: Stomach is notable for a small hiatal hernia. Visualized bowel is grossly unremarkable. Vascular/Lymphatic:  No evidence of abdominal aortic aneurysm. No suspicious abdominal lymphadenopathy. Other:  No abdominal ascites. Musculoskeletal: Mild degenerative changes of the lumbar spine. IMPRESSION: Motion degraded images. Cholelithiasis, without associated inflammatory changes to suggest acute cholecystitis. Choledocholithiasis with multiple mid/distal CBD stones measuring up to 6 mm.  ERCP is suggested. No peripancreatic fluid collection/pseudocyst in this patient with reported history of acute pancreatitis. Electronically Signed   By: SJulian HyM.D.   On: 02/20/2022 01:45   UKoreaAbdomen Limited RUQ (LIVER/GB)  Result Date: 02/19/2022 CLINICAL DATA:  Pancreatitis in a 81year old male. Assess for ductal dilation. EXAM: ULTRASOUND ABDOMEN LIMITED RIGHT UPPER QUADRANT COMPARISON:  April 2023 FINDINGS: Gallbladder: The gallbladder is filled with gallstones. Wall thickness is difficult to evaluate given the presence of numerous gallstones. The anterior wall does not appear grossly thickened. The posterior wall cannot be seen due to shadowing. No pericholecystic fluid. Calculi as large as 2 cm or greater, individual stones are difficult to evaluate. No reported tenderness over the gallbladder. Common bile duct: Diameter: 10 mm Liver: Lobular hepatic contours. Signs of fissural widening. Echogenic liver. Portal vein is patent on color Doppler imaging with normal direction of blood flow towards the liver. Other: Renal cysts of the RIGHT kidney are better characterized on previous imaging. These are not fully evaluated on the current exam. Based on previous imaging no dedicated imaging follow-up of this area is recommended. IMPRESSION: Dilation of the common bile duct at 1 cm in the setting of acute pancreatitis based on clinical history. MRCP may be helpful for further evaluation to exclude choledocholithiasis in this patient with extensive cholelithiasis. Gallbladder not well assessed due to extensive cholelithiasis. No reported tenderness over the gallbladder at this time. Electronically Signed   By: GZetta BillsM.D.   On: 02/19/2022 15:12   DG Chest  2 View  Result Date: 01/29/2022 CLINICAL DATA:  Productive cough EXAM: CHEST - 2 VIEW COMPARISON:  None Available. FINDINGS: Lungs are well expanded, symmetric, and clear. No pneumothorax or pleural effusion. Cardiac size within normal  limits. Pulmonary vascularity is normal. Osseous structures are age-appropriate. No acute bone abnormality. IMPRESSION: No active cardiopulmonary disease. Electronically Signed   By: Fidela Salisbury M.D.   On: 01/29/2022 19:12   DG Elbow Complete Left  Result Date: 01/27/2022 CLINICAL DATA:  Status post fall with posterior left elbow pain. EXAM: LEFT ELBOW - COMPLETE 3+ VIEW COMPARISON:  None Available. FINDINGS: There is no evidence of fracture, dislocation, or joint effusion. Minimal osteophytosis is identified in the proximal ulna. Soft tissue swelling of posterior elbow is noted. IMPRESSION: No acute fracture or dislocation. Soft tissue swelling of posterior elbow. Electronically Signed   By: Abelardo Diesel M.D.   On: 01/27/2022 12:49   DG Ribs Unilateral Left  Result Date: 01/27/2022 CLINICAL DATA:  Status post fall with left rib pain. EXAM: LEFT RIBS - 2 VIEW COMPARISON:  August 01, 2021 FINDINGS: Displaced fracture of medial aspect of the left clavicle identified. No acute fracture or dislocation identified in the left ribs. The visualized lung fields are clear. IMPRESSION: Displaced fracture of medial aspect of left clavicle. No acute fracture or dislocation identified in the left ribs. Electronically Signed   By: Abelardo Diesel M.D.   On: 01/27/2022 12:48   DG Thoracic Spine 2 View  Result Date: 01/27/2022 CLINICAL DATA:  Status post fall with thoracic pain. EXAM: THORACIC SPINE 2 VIEWS COMPARISON:  January 13, 2021 FINDINGS: There is no evidence of thoracic spine fracture. Kyphosis of thoracic spine identified. Degenerative joint changes of the spine with anterior flowing osteophytosis identified throughout thoracic spine. IMPRESSION: No acute fracture or dislocation. Degenerative joint changes. Electronically Signed   By: Abelardo Diesel M.D.   On: 01/27/2022 12:46    Micro Results   No results found for this or any previous visit (from the past 240 hour(s)).  Today   Subjective     William [name] today has no headache,no chest abdominal pain,no new weakness tingling or numbness, feels much better wants to go home today.    Objective   Blood pressure (!) 150/71, pulse 71, temperature 97.8 F (36.6 C), temperature source Oral, resp. rate 16, height '5\' 9"'$  (1.753 m), weight 88.9 kg, SpO2 96 %.   Intake/Output Summary (Last 24 hours) at 02/25/2022 0929 Last data filed at 02/25/2022 0400 Gross per 24 hour  Intake 311.22 ml  Output 300 ml  Net 11.22 ml    Exam  Awake Alert, No new F.N deficits,    [place].AT,PERRAL Supple Neck,   Symmetrical Chest wall movement, Good air movement bilaterally, CTAB RRR,No Gallops,   +ve B.Sounds, Abd Soft, Non tender,  No Cyanosis, Clubbing or edema    Data Review   Recent Labs  Lab 02/21/22 0142 02/22/22 0835 02/23/22 0328 02/24/22 0352 02/25/22 0536  WBC 5.5 5.2 7.0 9.6 8.4  HGB 9.3* 10.8* 9.7* 10.3* 9.3*  HCT 27.2* 31.6* 29.6* 31.3* 27.7*  PLT 154 172 175 169 154  MCV 96.8 96.3 97.7 99.4 99.3  MCH 33.1 32.9 32.0 32.7 33.3  MCHC 34.2 34.2 32.8 32.9 33.6  RDW 13.5 13.2 13.2 13.2 13.2  LYMPHSABS 2.0 1.4 1.6 0.9 1.5  MONOABS 0.6 0.5 0.7 0.6 1.1*  EOSABS 0.2 0.2 0.1 0.0 0.3  BASOSABS 0.0 0.0 0.0 0.0 0.0    Recent Labs  Lab 02/20/22 0742 02/21/22 0142 02/22/22 0835 02/23/22 0328 02/24/22 0352 02/25/22 0536  NA 139 139 139 138 138 137  K 3.9 3.8 3.5 3.6 4.9 4.2  CL 108 107 104 105 106 104  CO2 '23 25 27 26 25 25  '$ GLUCOSE 103* 95 109* 128* 158* 115*  BUN 26* '18 12 19 21 19  '$ CREATININE 1.64* 1.67* 1.65* 2.52* 2.33* 1.96*  CALCIUM 8.7* 8.6* 9.0 8.7* 8.5* 8.4*  AST 125* 62* 34 25 45* 25  ALT 187* 136* 101* 76* 83* 57*  ALKPHOS 417* 380* 367* 295* 293* 224*  BILITOT 0.8 0.7 0.6 0.7 0.7 0.4  ALBUMIN 2.8* 2.7* 3.1* 2.8* 2.8* 2.6*  MG 1.9 1.8 1.8 1.8 2.0  --   CRP 1.2* 1.4* 0.7 0.7 0.9  --   INR 1.1  --   --   --   --   --   BNP  --  38.3 61.6 49.0 61.3  --     Total Time in preparing paper work, data  evaluation and todays exam - 35 minutes  Lala Lund M.D on 02/25/2022 at 9:29 AM  [facility]

## 2022-02-25 NOTE — Final Progress Note (Signed)
Patient has been discharged. Per policy patient has met requirements to be discharged. Patient and family member of the patient received discharge packet. They both verbally stated they understood.

## 2022-02-25 NOTE — Anesthesia Postprocedure Evaluation (Signed)
Anesthesia Post Note  Patient: William [name]  Procedure(s) Performed: LAPAROSCOPIC CHOLECYSTECTOMY (Abdomen)     Patient location during evaluation: PACU Anesthesia Type: General Level of consciousness: sedated and patient cooperative Pain management: pain level controlled Vital Signs Assessment: post-procedure vital signs reviewed and stable Respiratory status: spontaneous breathing Cardiovascular status: stable Anesthetic complications: no   No notable events documented.  Last Vitals:  Vitals:   02/25/22 0357 02/25/22 0756  BP: 136/71 (!) 150/71  Pulse: 71 71  Resp: 18 16  Temp: 36.8 C 36.6 C  SpO2: 96% 96%    Last Pain:  Vitals:   02/25/22 0928  TempSrc:   PainSc: [place]

## 2022-02-25 NOTE — Progress Notes (Signed)
Physical Therapy Treatment Patient Details Name: William [name] MRN: [id number] DOB: 05/13/1941 Today's Date: 02/25/2022   History of Present Illness 81 yo male with onset of abd pain and emesis was admitted on 9/8.  Pti has acute cholecystitis and choledocholithiasis, with plan to avoid invasive tx given his stroke history.  PMHx:  stroke with L  hemiparesis, CKD3, HTN, vascular dementia    PT Comments    Pt received in supine, agreeable to therapy session with good command following, noted increased time for processing and pt with intermittent mild agitation but able to be redirected by spouse and PTA. Pt spouse requesting assist to dress him prior to DC and pt fair to poor seated balance during dynamic tasks with intermittent posterior LOB needing consistent min guard for safety and up to modA for transfers, bed mobility and gait with HHA. Increased time needed to initiate and perform all tasks due to cognitive deficit. Pt c/o abdominal discomfort and noted bowel sounds during session but pt unable to have BM when sitting on toilet. Pt continues to benefit from PT services to progress toward functional mobility goals.    Recommendations for follow up therapy are one component of a multi-disciplinary discharge planning process, led by the attending physician.  Recommendations may be updated based on patient status, additional functional criteria and insurance authorization.  Follow Up Recommendations  Skilled nursing-short term rehab (<3 hours/day) Can patient physically be transported by private vehicle: Yes   Assistance Recommended at Discharge Intermittent Supervision/Assistance  Patient can return home with the following A little help with walking and/or transfers;A little help with bathing/dressing/bathroom;Direct supervision/assist for medications management;Direct supervision/assist for financial management;Assist for transportation;Help with stairs or ramp for entrance   Equipment  Recommendations  None recommended by PT    Recommendations for Other Services       Precautions / Restrictions Precautions Precautions: Fall Precaution Comments: L hemiparesis Restrictions Weight Bearing Restrictions: No Other Position/Activity Restrictions: pt struggles with walker and SPC, leans Rt significantly     Mobility  Bed Mobility Overal bed mobility: Needs Assistance Bed Mobility: Rolling, Sidelying to Sit, Sit to Sidelying Rolling: Min assist Sidelying to sit: Mod assist, Min assist, HOB elevated     Sit to sidelying: Mod assist, +2 for physical assistance General bed mobility comments: increased assist returning to supine; pt sat up to EOB x2 during session    Transfers Overall transfer level: Needs assistance Equipment used: Rolling walker (2 wheels) Transfers: Sit to/from Stand Sit to Stand: Mod assist, +2 safety/equipment           General transfer comment: HHA, +2 for safety but only needing +1 assist to lift up; from EOB, recliner chair and elevated BSC surfaces; x6 total reps    Ambulation/Gait Ambulation/Gait assistance: Min assist, +2 safety/equipment Gait Distance (Feet): 30 Feet Assistive device: 1 person hand held assist, 2 person hand held assist Gait Pattern/deviations: Step-to pattern, Decreased step length - right, Decreased step length - left, Decreased stride length, Narrow base of support, Shuffle Gait velocity: grossly <0.2 m/s     General Gait Details: pt leans heavily to Rt if no one provide HHA on Rt side. step length short and shuffled with low foot clearance throughout. pt with great difficulty coordinating longer steps. c/o slight dizziness while standing; distance includes x2 seated breaks (toileting)   Modified Rankin (Stroke Patients Only) Modified Rankin (Stroke Patients Only) Pre-Morbid Rankin Score: Moderately severe disability (hx CVA)     Balance Overall balance assessment: Needs  assistance Sitting-balance  support: Feet supported Sitting balance-Leahy Scale: Fair     Standing balance support: Single extremity supported Standing balance-Leahy Scale: Poor Standing balance comment: reliant on external A/HHA                            Cognition Arousal/Alertness: Awake/alert Behavior During Therapy: Flat affect, Impulsive Overall Cognitive Status: History of cognitive impairments - at baseline                                 General Comments: needing step by step verbal and tactile cues to complete with increased time to perform all tasks        Exercises      General Comments General comments (skin integrity, edema, etc.): VSS on RA per chart review, pt c/o dizziness intermittently but no sx syncope and pallor remains good      Pertinent Vitals/Pain Pain Assessment Pain Assessment: Faces Faces Pain Scale: Hurts even more Pain Location: abdominal discomfort/bloating Pain Descriptors / Indicators: Discomfort, Pressure Pain Intervention(s): Monitored during session, Limited activity within patient's tolerance, Repositioned, RN gave pain meds during session, Other (comment) (attempted sitting on toilet for BM but pt not successful)           PT Goals (current goals can now be found in the care plan section) Acute Rehab PT Goals Patient Stated Goal: none stated PT Goal Formulation: With family Time For Goal Achievement: 03/06/22 Progress towards PT goals: Progressing toward goals    Frequency    Min 3X/week      PT Plan Current plan remains appropriate       AM-PAC PT "6 Clicks" Mobility   Outcome Measure  Help needed turning from your back to your side while in a flat bed without using bedrails?: A Little Help needed moving from lying on your back to sitting on the side of a flat bed without using bedrails?: A Little (minA with familiar person and technique) Help needed moving to and from a bed to a chair (including a wheelchair)?: A  Lot Help needed standing up from a chair using your arms (e.g., wheelchair or bedside chair)?: A Lot Help needed to walk in hospital room?: A Lot (mod cues) Help needed climbing 3-5 steps with a railing? : A Lot (anticipate mod cues and lift assist needed) 6 Click Score: 14    End of Session Equipment Utilized During Treatment: Gait belt Activity Tolerance: Patient tolerated treatment well Patient left: in chair;with nursing/sitter in room (in care of RN to transport to car via [facility]) Nurse Communication: Mobility status PT Visit Diagnosis: Unsteadiness on feet (R26.81);Muscle weakness (generalized) (M62.81);Difficulty in walking, not elsewhere classified (R26.2);Hemiplegia and hemiparesis Hemiplegia - Right/Left: Left Hemiplegia - dominant/non-dominant: Non-dominant Hemiplegia - caused by: Unspecified     Time: 1137-1221 PT Time Calculation (min) (ACUTE ONLY): 44 min  Charges:  $Gait Training: 8-22 mins $Therapeutic Activity: 23-37 mins                     Shedric Fredericks P., PTA Acute Rehabilitation Services Secure Chat Preferred 9a-5:30pm Office: [place] 02/25/2022, 12:32 PM

## 2022-02-26 ENCOUNTER — Emergency Department (HOSPITAL_COMMUNITY): Payer: PPO

## 2022-02-26 ENCOUNTER — Other Ambulatory Visit: Payer: Self-pay

## 2022-02-26 ENCOUNTER — Telehealth: Payer: Self-pay | Admitting: *Deleted

## 2022-02-26 ENCOUNTER — Encounter (HOSPITAL_COMMUNITY): Payer: Self-pay | Admitting: Emergency Medicine

## 2022-02-26 ENCOUNTER — Inpatient Hospital Stay (HOSPITAL_COMMUNITY)
Admission: EM | Admit: 2022-02-26 | Discharge: 2022-03-12 | DRG: 394 | Disposition: A | Discharge status: Skilled Nursing Facility | Payer: PPO | Attending: Family Medicine | Admitting: Family Medicine

## 2022-02-26 DIAGNOSIS — K219 Gastro-esophageal reflux disease without esophagitis: Secondary | ICD-10-CM | POA: Diagnosis present

## 2022-02-26 DIAGNOSIS — N179 Acute kidney failure, unspecified: Secondary | ICD-10-CM | POA: Diagnosis present

## 2022-02-26 DIAGNOSIS — Z91040 Latex allergy status: Secondary | ICD-10-CM

## 2022-02-26 DIAGNOSIS — M5430 Sciatica, unspecified side: Secondary | ICD-10-CM | POA: Diagnosis not present

## 2022-02-26 DIAGNOSIS — I63511 Cerebral infarction due to unspecified occlusion or stenosis of right middle cerebral artery: Secondary | ICD-10-CM | POA: Diagnosis not present

## 2022-02-26 DIAGNOSIS — K56609 Unspecified intestinal obstruction, unspecified as to partial versus complete obstruction: Secondary | ICD-10-CM | POA: Diagnosis not present

## 2022-02-26 DIAGNOSIS — K5730 Diverticulosis of large intestine without perforation or abscess without bleeding: Secondary | ICD-10-CM | POA: Diagnosis not present

## 2022-02-26 DIAGNOSIS — E876 Hypokalemia: Secondary | ICD-10-CM | POA: Diagnosis not present

## 2022-02-26 DIAGNOSIS — E870 Hyperosmolality and hypernatremia: Secondary | ICD-10-CM | POA: Diagnosis not present

## 2022-02-26 DIAGNOSIS — I10 Essential (primary) hypertension: Secondary | ICD-10-CM | POA: Diagnosis present

## 2022-02-26 DIAGNOSIS — E785 Hyperlipidemia, unspecified: Secondary | ICD-10-CM | POA: Diagnosis present

## 2022-02-26 DIAGNOSIS — R531 Weakness: Secondary | ICD-10-CM | POA: Diagnosis not present

## 2022-02-26 DIAGNOSIS — I6523 Occlusion and stenosis of bilateral carotid arteries: Secondary | ICD-10-CM | POA: Diagnosis not present

## 2022-02-26 DIAGNOSIS — R41841 Cognitive communication deficit: Secondary | ICD-10-CM | POA: Diagnosis not present

## 2022-02-26 DIAGNOSIS — I63311 Cerebral infarction due to thrombosis of right middle cerebral artery: Secondary | ICD-10-CM | POA: Diagnosis not present

## 2022-02-26 DIAGNOSIS — M47816 Spondylosis without myelopathy or radiculopathy, lumbar region: Secondary | ICD-10-CM | POA: Diagnosis present

## 2022-02-26 DIAGNOSIS — K3189 Other diseases of stomach and duodenum: Secondary | ICD-10-CM | POA: Diagnosis not present

## 2022-02-26 DIAGNOSIS — K6389 Other specified diseases of intestine: Secondary | ICD-10-CM | POA: Diagnosis not present

## 2022-02-26 DIAGNOSIS — N401 Enlarged prostate with lower urinary tract symptoms: Secondary | ICD-10-CM | POA: Diagnosis not present

## 2022-02-26 DIAGNOSIS — K567 Ileus, unspecified: Secondary | ICD-10-CM | POA: Diagnosis present

## 2022-02-26 DIAGNOSIS — M6259 Muscle wasting and atrophy, not elsewhere classified, multiple sites: Secondary | ICD-10-CM | POA: Diagnosis not present

## 2022-02-26 DIAGNOSIS — L309 Dermatitis, unspecified: Secondary | ICD-10-CM | POA: Diagnosis present

## 2022-02-26 DIAGNOSIS — F411 Generalized anxiety disorder: Secondary | ICD-10-CM | POA: Diagnosis present

## 2022-02-26 DIAGNOSIS — R0689 Other abnormalities of breathing: Secondary | ICD-10-CM | POA: Diagnosis not present

## 2022-02-26 DIAGNOSIS — R0902 Hypoxemia: Secondary | ICD-10-CM | POA: Diagnosis not present

## 2022-02-26 DIAGNOSIS — I69354 Hemiplegia and hemiparesis following cerebral infarction affecting left non-dominant side: Secondary | ICD-10-CM | POA: Diagnosis not present

## 2022-02-26 DIAGNOSIS — R4182 Altered mental status, unspecified: Secondary | ICD-10-CM | POA: Diagnosis not present

## 2022-02-26 DIAGNOSIS — R1311 Dysphagia, oral phase: Secondary | ICD-10-CM | POA: Diagnosis not present

## 2022-02-26 DIAGNOSIS — Z818 Family history of other mental and behavioral disorders: Secondary | ICD-10-CM

## 2022-02-26 DIAGNOSIS — F419 Anxiety disorder, unspecified: Secondary | ICD-10-CM | POA: Diagnosis present

## 2022-02-26 DIAGNOSIS — I63411 Cerebral infarction due to embolism of right middle cerebral artery: Secondary | ICD-10-CM | POA: Diagnosis not present

## 2022-02-26 DIAGNOSIS — Z87891 Personal history of nicotine dependence: Secondary | ICD-10-CM

## 2022-02-26 DIAGNOSIS — E872 Acidosis, unspecified: Secondary | ICD-10-CM | POA: Diagnosis not present

## 2022-02-26 DIAGNOSIS — D649 Anemia, unspecified: Secondary | ICD-10-CM | POA: Diagnosis present

## 2022-02-26 DIAGNOSIS — E440 Moderate protein-calorie malnutrition: Secondary | ICD-10-CM | POA: Diagnosis not present

## 2022-02-26 DIAGNOSIS — R55 Syncope and collapse: Secondary | ICD-10-CM | POA: Diagnosis not present

## 2022-02-26 DIAGNOSIS — K5989 Other specified functional intestinal disorders: Secondary | ICD-10-CM | POA: Diagnosis not present

## 2022-02-26 DIAGNOSIS — N400 Enlarged prostate without lower urinary tract symptoms: Secondary | ICD-10-CM | POA: Diagnosis present

## 2022-02-26 DIAGNOSIS — Z91018 Allergy to other foods: Secondary | ICD-10-CM

## 2022-02-26 DIAGNOSIS — Z4682 Encounter for fitting and adjustment of non-vascular catheter: Secondary | ICD-10-CM | POA: Diagnosis not present

## 2022-02-26 DIAGNOSIS — Z8551 Personal history of malignant neoplasm of bladder: Secondary | ICD-10-CM

## 2022-02-26 DIAGNOSIS — E559 Vitamin D deficiency, unspecified: Secondary | ICD-10-CM | POA: Diagnosis present

## 2022-02-26 DIAGNOSIS — Z85828 Personal history of other malignant neoplasm of skin: Secondary | ICD-10-CM

## 2022-02-26 DIAGNOSIS — K9189 Other postprocedural complications and disorders of digestive system: Secondary | ICD-10-CM | POA: Diagnosis present

## 2022-02-26 DIAGNOSIS — K8050 Calculus of bile duct without cholangitis or cholecystitis without obstruction: Secondary | ICD-10-CM | POA: Diagnosis not present

## 2022-02-26 DIAGNOSIS — M7989 Other specified soft tissue disorders: Secondary | ICD-10-CM | POA: Diagnosis not present

## 2022-02-26 DIAGNOSIS — I129 Hypertensive chronic kidney disease with stage 1 through stage 4 chronic kidney disease, or unspecified chronic kidney disease: Secondary | ICD-10-CM | POA: Diagnosis present

## 2022-02-26 DIAGNOSIS — S32019D Unspecified fracture of first lumbar vertebra, subsequent encounter for fracture with routine healing: Secondary | ICD-10-CM | POA: Diagnosis not present

## 2022-02-26 DIAGNOSIS — R29818 Other symptoms and signs involving the nervous system: Secondary | ICD-10-CM | POA: Diagnosis not present

## 2022-02-26 DIAGNOSIS — F0390 Unspecified dementia without behavioral disturbance: Secondary | ICD-10-CM | POA: Diagnosis present

## 2022-02-26 DIAGNOSIS — Z9049 Acquired absence of other specified parts of digestive tract: Secondary | ICD-10-CM | POA: Diagnosis not present

## 2022-02-26 DIAGNOSIS — I69359 Hemiplegia and hemiparesis following cerebral infarction affecting unspecified side: Secondary | ICD-10-CM | POA: Diagnosis not present

## 2022-02-26 DIAGNOSIS — R293 Abnormal posture: Secondary | ICD-10-CM | POA: Diagnosis not present

## 2022-02-26 DIAGNOSIS — R609 Edema, unspecified: Secondary | ICD-10-CM | POA: Diagnosis not present

## 2022-02-26 DIAGNOSIS — D509 Iron deficiency anemia, unspecified: Secondary | ICD-10-CM | POA: Diagnosis present

## 2022-02-26 DIAGNOSIS — Z66 Do not resuscitate: Secondary | ICD-10-CM | POA: Diagnosis present

## 2022-02-26 DIAGNOSIS — F01A4 Vascular dementia, mild, with anxiety: Secondary | ICD-10-CM | POA: Diagnosis present

## 2022-02-26 DIAGNOSIS — N171 Acute kidney failure with acute cortical necrosis: Secondary | ICD-10-CM | POA: Diagnosis not present

## 2022-02-26 DIAGNOSIS — R410 Disorientation, unspecified: Secondary | ICD-10-CM | POA: Diagnosis not present

## 2022-02-26 DIAGNOSIS — N1832 Chronic kidney disease, stage 3b: Secondary | ICD-10-CM | POA: Diagnosis present

## 2022-02-26 DIAGNOSIS — Z91030 Bee allergy status: Secondary | ICD-10-CM

## 2022-02-26 DIAGNOSIS — K56600 Partial intestinal obstruction, unspecified as to cause: Secondary | ICD-10-CM | POA: Diagnosis present

## 2022-02-26 DIAGNOSIS — Z79899 Other long term (current) drug therapy: Secondary | ICD-10-CM

## 2022-02-26 DIAGNOSIS — R1111 Vomiting without nausea: Secondary | ICD-10-CM | POA: Diagnosis not present

## 2022-02-26 DIAGNOSIS — R4189 Other symptoms and signs involving cognitive functions and awareness: Secondary | ICD-10-CM | POA: Diagnosis present

## 2022-02-26 DIAGNOSIS — Z8249 Family history of ischemic heart disease and other diseases of the circulatory system: Secondary | ICD-10-CM

## 2022-02-26 DIAGNOSIS — Z888 Allergy status to other drugs, medicaments and biological substances status: Secondary | ICD-10-CM

## 2022-02-26 DIAGNOSIS — G8929 Other chronic pain: Secondary | ICD-10-CM | POA: Diagnosis not present

## 2022-02-26 DIAGNOSIS — Z7902 Long term (current) use of antithrombotics/antiplatelets: Secondary | ICD-10-CM

## 2022-02-26 DIAGNOSIS — D72829 Elevated white blood cell count, unspecified: Secondary | ICD-10-CM

## 2022-02-26 DIAGNOSIS — N184 Chronic kidney disease, stage 4 (severe): Secondary | ICD-10-CM | POA: Diagnosis present

## 2022-02-26 DIAGNOSIS — Z7401 Bed confinement status: Secondary | ICD-10-CM | POA: Diagnosis not present

## 2022-02-26 DIAGNOSIS — I639 Cerebral infarction, unspecified: Secondary | ICD-10-CM | POA: Diagnosis present

## 2022-02-26 DIAGNOSIS — R110 Nausea: Secondary | ICD-10-CM | POA: Diagnosis not present

## 2022-02-26 LAB — CBC WITH DIFFERENTIAL/PLATELET
Abs Immature Granulocytes: 0.05 10*3/uL (ref 0.00–0.07)
Basophils Absolute: 0.1 10*3/uL (ref 0.0–0.1)
Basophils Relative: 1 %
Eosinophils Absolute: 0.1 10*3/uL (ref 0.0–0.5)
Eosinophils Relative: 1 %
HCT: 35.7 % — ABNORMAL LOW (ref 39.0–52.0)
Hemoglobin: 11.6 g/dL — ABNORMAL LOW (ref 13.0–17.0)
Immature Granulocytes: 0 %
Lymphocytes Relative: 8 %
Lymphs Abs: 1 10*3/uL (ref 0.7–4.0)
MCH: 32.2 pg (ref 26.0–34.0)
MCHC: 32.5 g/dL (ref 30.0–36.0)
MCV: 99.2 fL (ref 80.0–100.0)
Monocytes Absolute: 1.1 10*3/uL — ABNORMAL HIGH (ref 0.1–1.0)
Monocytes Relative: 9 %
Neutro Abs: 10.5 10*3/uL — ABNORMAL HIGH (ref 1.7–7.7)
Neutrophils Relative %: 81 %
Platelets: 270 10*3/uL (ref 150–400)
RBC: 3.6 MIL/uL — ABNORMAL LOW (ref 4.22–5.81)
RDW: 13 % (ref 11.5–15.5)
WBC: 12.9 10*3/uL — ABNORMAL HIGH (ref 4.0–10.5)
nRBC: 0 % (ref 0.0–0.2)

## 2022-02-26 LAB — COMPREHENSIVE METABOLIC PANEL
ALT: 46 U/L — ABNORMAL HIGH (ref 0–44)
AST: 20 U/L (ref 15–41)
Albumin: 2.7 g/dL — ABNORMAL LOW (ref 3.5–5.0)
Alkaline Phosphatase: 219 U/L — ABNORMAL HIGH (ref 38–126)
Anion gap: 10 (ref 5–15)
BUN: 21 mg/dL (ref 8–23)
CO2: 24 mmol/L (ref 22–32)
Calcium: 8.9 mg/dL (ref 8.9–10.3)
Chloride: 104 mmol/L (ref 98–111)
Creatinine, Ser: 1.74 mg/dL — ABNORMAL HIGH (ref 0.61–1.24)
GFR, Estimated: 39 mL/min — ABNORMAL LOW (ref >60)
Glucose, Bld: 175 mg/dL — ABNORMAL HIGH (ref 70–99)
Potassium: 4 mmol/L (ref 3.5–5.1)
Sodium: 138 mmol/L (ref 135–145)
Total Bilirubin: 0.8 mg/dL (ref 0.3–1.2)
Total Protein: 6.1 g/dL — ABNORMAL LOW (ref 6.5–8.1)

## 2022-02-26 LAB — TROPONIN I (HIGH SENSITIVITY): Troponin I (High Sensitivity): 28 ng/L — ABNORMAL HIGH (ref <18)

## 2022-02-26 MED ORDER — METHOCARBAMOL 1000 MG/10ML IJ SOLN: 500.0000 mg | Freq: Four times a day (QID) | INTRAVENOUS | Status: DC | PRN

## 2022-02-26 MED ORDER — BISACODYL 10 MG RE SUPP
10.0000 mg | Freq: Once | RECTAL | Status: AC
  Administered 2022-02-26: 10 mg via RECTAL
  Filled 2022-02-26: qty 1

## 2022-02-26 MED ORDER — TRAZODONE HCL 50 MG PO TABS
75.0000 mg | ORAL_TABLET | Freq: Every day | ORAL | Status: DC
  Administered 2022-02-26 – 2022-03-01 (×4): 75 mg via ORAL
  Filled 2022-02-26 (×6): qty 2

## 2022-02-26 MED ORDER — IOHEXOL 350 MG/ML SOLN
75.0000 mL | Freq: Once | INTRAVENOUS | Status: AC | PRN
  Administered 2022-02-26: 75 mL via INTRAVENOUS

## 2022-02-26 MED ORDER — ONDANSETRON HCL 4 MG PO TABS: 4.0000 mg | ORAL_TABLET | Freq: Four times a day (QID) | ORAL | Status: DC | PRN

## 2022-02-26 MED ORDER — SODIUM CHLORIDE 0.9 % IV BOLUS
1000.0000 mL | Freq: Once | INTRAVENOUS | Status: AC
  Administered 2022-02-26: 1000 mL via INTRAVENOUS

## 2022-02-26 MED ORDER — PANTOPRAZOLE SODIUM 40 MG IV SOLR
40.0000 mg | INTRAVENOUS | Status: DC
  Administered 2022-02-26 – 2022-03-01 (×4): 40 mg via INTRAVENOUS
  Filled 2022-02-26 (×4): qty 10

## 2022-02-26 MED ORDER — FERROUS SULFATE 325 (65 FE) MG PO TABS
325.0000 mg | ORAL_TABLET | Freq: Every day | ORAL | Status: DC
  Administered 2022-02-27 – 2022-03-04 (×5): 325 mg via ORAL
  Filled 2022-02-26 (×5): qty 1

## 2022-02-26 MED ORDER — LORAZEPAM 0.5 MG PO TABS
0.2500 mg | ORAL_TABLET | Freq: Every day | ORAL | Status: DC
  Administered 2022-02-26: 0.25 mg via ORAL
  Filled 2022-02-26: qty 1

## 2022-02-26 MED ORDER — ONDANSETRON HCL 4 MG/2ML IJ SOLN
4.0000 mg | Freq: Four times a day (QID) | INTRAMUSCULAR | Status: DC | PRN
  Administered 2022-02-26 – 2022-03-02 (×4): 4 mg via INTRAVENOUS
  Filled 2022-02-26 (×4): qty 2

## 2022-02-26 MED ORDER — CARBAMAZEPINE 100 MG/5ML PO SUSP
100.0000 mg | ORAL | Status: DC
  Administered 2022-02-27 – 2022-03-04 (×12): 100 mg via ORAL
  Filled 2022-02-26 (×17): qty 5

## 2022-02-26 MED ORDER — CARBAMAZEPINE 100 MG/5ML PO SUSP: 100.0000 mg | ORAL | Status: DC

## 2022-02-26 MED ORDER — AMLODIPINE BESYLATE 10 MG PO TABS: 10.0000 mg | ORAL_TABLET | Freq: Every morning | ORAL | Status: DC

## 2022-02-26 MED ORDER — TAMSULOSIN HCL 0.4 MG PO CAPS
0.4000 mg | ORAL_CAPSULE | Freq: Every day | ORAL | Status: DC
  Administered 2022-02-27 – 2022-03-11 (×11): 0.4 mg via ORAL
  Filled 2022-02-26 (×11): qty 1

## 2022-02-26 MED ORDER — SODIUM CHLORIDE 0.9 % IV SOLN: INTRAVENOUS | Status: AC

## 2022-02-26 MED ORDER — HYDRALAZINE HCL 25 MG PO TABS
25.0000 mg | ORAL_TABLET | Freq: Three times a day (TID) | ORAL | Status: DC
  Administered 2022-02-26: 25 mg via ORAL
  Filled 2022-02-26: qty 1

## 2022-02-26 MED ORDER — ACETAMINOPHEN 325 MG PO TABS
650.0000 mg | ORAL_TABLET | Freq: Four times a day (QID) | ORAL | Status: DC | PRN
  Administered 2022-02-26 – 2022-03-11 (×3): 650 mg via ORAL
  Filled 2022-02-26 (×4): qty 2

## 2022-02-26 MED ORDER — LORAZEPAM ER 1 MG PO CS24: 1.0000 mg | EXTENDED_RELEASE_CAPSULE | Freq: Every day | ORAL | Status: DC

## 2022-02-26 MED ORDER — CARBAMAZEPINE 100 MG/5ML PO SUSP
200.0000 mg | Freq: Every day | ORAL | Status: DC
  Administered 2022-02-27 – 2022-03-01 (×3): 200 mg via ORAL
  Filled 2022-02-26 (×14): qty 10

## 2022-02-26 MED ORDER — ONDANSETRON HCL 4 MG/2ML IJ SOLN
4.0000 mg | Freq: Once | INTRAMUSCULAR | Status: AC
  Administered 2022-02-26: 4 mg via INTRAVENOUS
  Filled 2022-02-26: qty 2

## 2022-02-26 MED ORDER — AMANTADINE HCL 100 MG PO CAPS
100.0000 mg | ORAL_CAPSULE | Freq: Every day | ORAL | Status: DC
  Administered 2022-02-27 – 2022-03-04 (×6): 100 mg via ORAL
  Filled 2022-02-26 (×7): qty 1

## 2022-02-26 NOTE — ED Notes (Signed)
Pt attempted to use bathroom before getting into bed. Pt was unable to go.

## 2022-02-26 NOTE — ED Notes (Signed)
Patient's wife Mrs. Tamala Julian:  [phone]

## 2022-02-26 NOTE — ED Provider Notes (Signed)
[facility] EMERGENCY DEPARTMENT Provider Note  CSN: [id number] Arrival date & time: 02/26/22 2423  Chief Complaint(s) Weakness  HPI William [name] is a 81 y.o. male with history of stroke with chronic left-sided deficits, CKD, hypertension, hyperlipidemia presenting to the emergency department with nausea.  Patient was recently discharged yesterday after being admitted for cholecystitis with choledocholithiasis.  He reports that he has not had a bowel movement since discharge.  His wife reports that yesterday he had some episodes of vomiting after trying to eat.  No vomiting today.  She also reports generalized weakness.  No dysuria.  He has chronic left-sided weakness but no new focal weakness.  He has not eaten anything since yesterday.  Reports that today, the patient stood up and then developed lightheadedness, had an episode of near fainting, was able to sit down until symptoms resolved.  Triage evaluation no notes that face got a little droopy, wife reports that his whole face got droopy not one side of his face the other.  He did not have any focal weakness during this episode.  He has not had any chest pain or palpitations prior to his episode of near syncope.   Past Medical History 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   HTN (hypertension) 07/19/2015   Hyperlipidemia    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   Mild, related to stroke history   Nocturia    Renal insufficiency 06/25/2013   S/P carotid endarterectomy    BILATERAL ICA--  PATENT PER DUPLEX  05-19-2012   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   Right calf   Urinary frequency    Vitamin D deficiency    Patient Active Problem List   Diagnosis Date Noted   Choledocholithiasis 02/19/2022   DNR (do not resuscitate) 02/19/2022   Left elbow pain 01/26/2022   Mid back pain on left side 01/26/2022   Rib pain 01/26/2022   Acute pain of left shoulder 11/12/2021   Leukocytes in urine 11/12/2021   Urinary frequency 11/12/2021   Thrush 10/08/2021   Hemiplegia, dominant side S/P CVA (cerebrovascular accident) ([place]) 09/11/2021   Insomnia    Prediabetes    Chronic kidney disease (CKD), stage IV (severe) (HCC)    Basal ganglia infarction ([place]) 07/29/2021   Transaminitis 07/27/2021   UTI (urinary tract infection) 07/27/2021   CVA (cerebral vascular accident) ([place]) 07/27/2021   Fall 07/27/2021   Hyperglycemia 07/27/2021   CKD (chronic kidney disease), stage IV ([place]) 07/27/2021   Cholelithiasis 07/27/2021   Hypoxia 07/27/2021   Nausea and vomiting 53/61/4431   Acute metabolic encephalopathy 54/00/8676   Normocytic anemia 07/27/2021   Chronic back pain 07/27/2021   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[place]) 06/22/2021   Closed fracture of first lumbar vertebra with routine healing 02/03/2021   Closed fracture of multiple ribs 11/18/2020   Anxiety 01/29/2020   Leg pain, bilateral 01/29/2020   Ingrown toenail 07/13/2019   Lumbar spondylosis 05/02/2018   Pain in left knee 03/09/2018  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   Trochanteric bursitis of left hip 01/16/2018   Preventative health care 09/26/2017   HTN (hypertension) 07/19/2015   Hyperlipidemia 07/19/2015   Great toe pain 02/11/2014   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 Authorizing Provider  acetaminophen (TYLENOL) 325 MG tablet Take 2 tablets (650 mg total) by mouth every 6 (six) hours as needed for mild pain. 02/24/22   Winferd Humphrey, PA-C  amantadine (SYMMETREL) 100 MG capsule Take 1 capsule (100 mg total) by mouth daily. 01/04/22   Ann Held, DO  amLODipine (NORVASC) 10 MG tablet Take 1 tablet (10 mg total) by mouth every morning. 02/03/22   Carollee Herter, Alferd Apa, DO  carBAMazepine (TEGRETOL) 100 MG/5ML suspension Take 5 mLs (100 mg total) by mouth 3 (three) times daily. Patient taking differently: Take 100-200 mg by mouth 3 (three) times daily. Take 5 ml (100 mg) BID & Take 10 ml (200 mg) in the evening 10/05/21   Raulkar, Clide Deutscher, MD  Cholecalciferol (VITAMIN D3) 25 MCG (1000 UT) CHEW Chew 1 tablet by mouth daily.    [provider]  clopidogrel (PLAVIX) 75 MG tablet Take 1 tablet (75 mg total) by mouth daily. 01/04/22   Ann Held, DO  clotrimazole-betamethasone (LOTRISONE) cream Apply 1 Application on to the skin daily. Patient taking differently: Apply 1 Application topically daily as needed (itching). 12/10/21   Roma Schanz R, DO  ferrous sulfate 325 (65 FE) MG EC tablet Take 325 mg by mouth daily with breakfast.    [provider]  fluticasone (CUTIVATE) 9.56 % cream 1 application daily as needed (psoriasis). 12/16/16   [provider]  hydrALAZINE (APRESOLINE) 25 MG tablet Take 1 tablet (25 mg total) by mouth every 8 (eight) hours. 01/04/22   Ann Held, DO  hydrOXYzine (VISTARIL) 25 MG capsule Take 1 capsule by mouth everyday at 3:30 PM as needed Patient  taking differently: Take 25 mg by mouth daily as needed for anxiety. Take 1 capsule by mouth everyday PRN at 3:30 PM 01/26/22   Plovsky, Berneta Sages, MD  LORazepam (ATIVAN) 0.5 MG tablet Take 1/2 tablet (0.25 mg total) by mouth daily. 01/26/22   Plovsky, Berneta Sages, MD  LORazepam ER (LOREEV XR) 1 MG CS24 Take 1 mg (1 capsule) by mouth at bedtime. 01/26/22   Plovsky, Berneta Sages, MD  Multiple Vitamins-Minerals (ADULT ONE DAILY GUMMIES) CHEW Chew 1 tablet by mouth every morning.    [provider]  pantoprazole (PROTONIX) 40 MG tablet Take 1 tablet (40 mg total) by mouth daily. 09/15/21   Ann Held, DO  polyethylene glycol (MIRALAX / GLYCOLAX) 17 g packet Take 17 g by mouth daily. 10/02/21   Sherwood Gambler, MD  tamsulosin (FLOMAX) 0.4 MG CAPS capsule Take 1 capsule (0.4 mg total) by mouth daily after supper. 02/08/22   Ann Held, DO  traMADol (ULTRAM) 50 MG tablet Take 1 tablet (50 mg  total) by mouth every 6 (six) hours as needed for up to 5 days for moderate pain or severe pain. 02/24/22 03/01/22  Winferd Humphrey, PA-C  traZODone (DESYREL) 150 MG tablet Take 1/2 tablet (75 mg total) by mouth at bedtime. 01/26/22   Plovsky, Berneta Sages, MD  triamcinolone cream (KENALOG) 0.1 % Apply 1 application topically 2 (two) times daily. Patient taking differently: Apply 1 Application topically 2 (two) times daily as needed (rash). 01/04/22   Ann Held, DO                                                                                                                                    Past Surgical History Past Surgical History:  Procedure Laterality Date   APPENDECTOMY  AS CHILD   CARDIOVASCULAR STRESS TEST  03-27-2012  DR CRENSHAW   LOW RISK LEXISCAN STUDY-- PROBABLE NORMAL PERFUSION AND SOFT TISSUE ATTENUATION/  NO ISCHEMIA/ EF 51%   CAROTID ENDARTERECTOMY Bilateral LEFT  11-12-2008  DR GREG HAYES   RIGHT ICA  2006  ([facility])   CHOLECYSTECTOMY N/A 02/23/2022   Procedure: LAPAROSCOPIC  CHOLECYSTECTOMY;  Surgeon: Felicie Morn, MD;  Location: [place];  Service: General;  Laterality: N/A;   CYSTOSCOPY W/ RETROGRADES Bilateral 06/22/2021   Procedure: CYSTOSCOPY WITH RETROGRADE PYELOGRAM;  Surgeon: Franchot Gallo, MD;  Location: [facility];  Service: Urology;  Laterality: Bilateral;   CYSTOSCOPY WITH LITHOLAPAXY N/A 02/26/2013   Procedure: CYSTOSCOPY WITH LITHOLAPAXY;  Surgeon: Franchot Gallo, MD;  Location: [facility];  Service: Urology;  Laterality: N/A;   ENDOSCOPIC RETROGRADE CHOLANGIOPANCREATOGRAPHY (ERCP) WITH PROPOFOL N/A 02/22/2022   Procedure: ENDOSCOPIC RETROGRADE CHOLANGIOPANCREATOGRAPHY (ERCP) WITH PROPOFOL;  Surgeon: Carol Ada, MD;  Location: [place];  Service: Gastroenterology;  Laterality: N/A;   EYE SURGERY  Jan. 2016   cataract surgery both eyes   INGUINAL HERNIA REPAIR Right 11-08-2006   IR KYPHO EA ADDL LEVEL THORACIC OR LUMBAR  02/12/2021   IR RADIOLOGIST EVAL & MGMT  02/18/2021   MASS EXCISION N/A 03/03/2016   Procedure: EXCISION OF BACK  MASS;  Surgeon: Stark Klein, MD;  Location: [place];  Service: General;  Laterality: N/A;   MOHS SURGERY Left 1/ 2016   Dr Nevada Crane-- Basal cell   PROSTATE SURGERY     REMOVAL OF STONES  02/22/2022   Procedure: REMOVAL OF STONES;  Surgeon: Carol Ada, MD;  Location: [facility] ENDOSCOPY;  Service: Gastroenterology;;   [name]  02/22/2022   Procedure: [name];  Surgeon: Carol Ada, MD;  Location: [facility] ENDOSCOPY;  Service: Gastroenterology;;   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-C N/A 06/22/2021   Procedure: TRANSURETHRAL RESECTION OF BLADDER TUMOR;  Surgeon: Franchot Gallo, MD;  Location: [facility];  Service: Urology;  Laterality: N/A;   TRANSURETHRAL RESECTION OF PROSTATE N/A 02/26/2013   Procedure: TRANSURETHRAL RESECTION OF THE PROSTATE WITH GYRUS INSTRUMENTS;  Surgeon: Franchot Gallo, MD;  Location: [place];  Service: Urology;  Laterality: N/A;   TRANSURETHRAL RESECTION OF PROSTATE N/A 06/22/2021   Procedure: TRANSURETHRAL RESECTION OF THE PROSTATE (TURP);  Surgeon: Franchot Gallo, MD;  Location: [facility];  Service: Urology;  Laterality: N/A;   Family History Family History  Problem Relation Age of Onset   Heart disease Mother        CHF   Bipolar disorder Mother    Heart disease Father        CHF    Social History Social History   Tobacco Use   Smoking status: Former    Packs/day: 2.00    Years: 40.00    Total pack years: 80.00    Types: Cigarettes    Quit date: 02/15/2005    Years since quitting: 17.0   Smokeless tobacco: Never  Vaping Use   Vaping Use: Never used  Substance Use Topics   Alcohol use: Not Currently    Comment: Occasional   Drug use: No   Allergies Bee venom, Strawberry extract, Latex, Zetia [ezetimibe], Adhesive [tape], and Statins  Review of Systems Review of Systems  All other systems reviewed and are negative.   Physical Exam Vital Signs  I have reviewed the triage vital signs BP (!) 151/69 (BP Location: Right Arm)   Pulse 77   Temp 98.1 F (36.7 C) (Oral)   Resp 20   SpO2 98%  Physical Exam Vitals and nursing note reviewed.  Constitutional:      General: He is not in acute distress.    Appearance: Normal appearance.  HENT:     Mouth/Throat:     Mouth: Mucous membranes are dry.  Eyes:     Conjunctiva/sclera: Conjunctivae normal.  Cardiovascular:     Rate and Rhythm: Normal rate and regular rhythm.  Pulmonary:     Effort: Pulmonary effort is normal. No respiratory distress.     Breath sounds: Normal breath sounds.  Abdominal:     General: Abdomen is flat. There is distension.     Palpations: Abdomen is soft.     Tenderness: There is abdominal tenderness (mild diffuse tenderness).  Musculoskeletal:     Comments: Trace BL LE edema  Skin:    General: Skin is warm and dry.     Capillary Refill:  Capillary refill takes less than 2 seconds.  Neurological:     Mental Status: He is alert and oriented to person, place, and time. Mental status is at baseline.     Comments: Cranial nerves II through XII intact other than chronic left facial droop per wife which is unchanged, strength 4+ out of 5 in the left upper extremity apparently, 5/5 otherwise in the right upper and bilateral lower extremities, no sensory deficit to light touch, no dysmetria on finger-nose-finger testing on right, some on left which again wife reports is chronic. Gait deferred   Psychiatric:        Mood and Affect: Mood normal.        Behavior: Behavior normal.     ED Results and Treatments Labs (all labs ordered are listed, but only abnormal results are displayed) Labs Reviewed  COMPREHENSIVE METABOLIC PANEL - Abnormal; Notable for the following components:      Result Value   Glucose, Bld 175 (*)    Creatinine, Ser 1.74 (*)    Total Protein 6.1 (*)    Albumin 2.7 (*)    ALT 46 (*)    Alkaline Phosphatase 219 (*)  GFR, Estimated 39 (*)    All other components within normal limits  CBC WITH DIFFERENTIAL/PLATELET - Abnormal; Notable for the following components:   WBC 12.9 (*)    RBC 3.60 (*)    Hemoglobin 11.6 (*)    HCT 35.7 (*)    Neutro Abs 10.5 (*)    Monocytes Absolute 1.1 (*)    All other components within normal limits  URINALYSIS, ROUTINE W REFLEX MICROSCOPIC  TROPONIN I (HIGH SENSITIVITY)                                                                                                                          Radiology CT ABDOMEN PELVIS W CONTRAST  Addendum Date: 02/26/2022   ADDENDUM REPORT: 02/26/2022 12:26 ADDENDUM: The IV contrast administered for this exam was 100 mL Omnipaque 300 IV Electronically Signed   By: Yvonne Kendall M.D.   On: 02/26/2022 12:26   Result Date: 02/26/2022 CLINICAL DATA:  Abdominal pain.  Postoperative. EXAM: CT ABDOMEN AND PELVIS WITH CONTRAST TECHNIQUE:  Multidetector CT imaging of the abdomen and pelvis was performed using the standard protocol following bolus administration of intravenous contrast. RADIATION DOSE REDUCTION: This exam was performed according to the departmental dose-optimization program which includes automated exposure control, adjustment of the mA and/or kV according to patient size and/or use of iterative reconstruction technique. CONTRAST:  100 mL Omnipaque 300 IV COMPARISON:  MRI abdomen 02/20/2022; right upper quadrant abdominal ultrasound 02/19/2022; CT abdomen and pelvis 10/02/2021 FINDINGS: Lower chest: There is mild fluid within the visualized inferior aspect of the left major fissure. Mild posterior right lower lobe curvilinear subsegmental atelectasis. Heart size is again mildly enlarged. Mild calcifications within the visualized aortic root, similar to prior. No pericardial effusion. Hepatobiliary: There is diffuse decreased density again seen throughout the liver suggesting fatty infiltration. There are new cholecystectomy clips. There appears to be interval resection of the prior gallstone-filled gallbladder. Within the gallbladder fossa there is scattered fluid and air density (axial series 1, images 25 through 30), presumably normal postoperative findings at this time. No liver lesion is seen. No intrahepatic or extrahepatic biliary ductal dilatation. Pancreas: There is again mild fatty infiltration of the pancreatic head greater than the downstream pancreatic body. No pancreatic ductal dilatation is seen. Spleen: Normal in size without focal abnormality. Adrenals/Urinary Tract: Normal adrenals. The kidneys enhance uniformly and are symmetric in size. Multiple low-density renal lesions are again seen. Dominant multilobular fluid density cyst within the mid to upper pole of the right kidney measures up to 5.8 cm in transverse dimension and 5.8 cm in craniocaudal dimension, similar to prior. Additional smaller right renal fluid  density cysts. There is a 13 mm left upper pole fluid density cyst, unchanged. No follow-up imaging recommended. No hydronephrosis. No focal urinary bladder wall thickening. Stomach/Bowel: Moderate to high-grade sigmoid and mild descending colon diverticulosis is again seen. Mild free fluid adjacent to the posteroinferior aspect of the proximal to mid sigmoid colon (axial series 1  images 86 through 91 and coronal series 9, image 60) is favored to be secondary to the postoperative ascites and not definite sigmoid diverticulitis. No definitive sigmoid colon inflammatory wall thickening. There is mild fluid around the descending colon which is new from 10/02/2021 CT, however the descending colon inflammatory wall thickening is no longer seen. The terminal ileum is unremarkable. The appendix is again not confidently identified. No definite inflammatory changes are seen around the cecum to indicate secondary signs of acute appendicitis. There is moderate fluid distention of the visualized distal esophagus, moderate to high-grade just above the gastroesophageal junction (axial image 20). There is moderate fluid throughout the patient's stomach. Note is made that patient may be at increased risk for aspiration given the distal esophageal fluid. There are air-fluid levels within the proximal to mid small bowel. Mild small bowel dilatation up to approximately 3.4 cm (axial series 1, image 70). The more distal small bowel normally tapers to the level of the terminal ileum. No evidence of bowel obstruction. Vascular/Lymphatic: No abdominal aortic aneurysm. The major intra-abdominal aortic branch vessels are patent. Mild-to-moderate atherosclerotic calcifications. No mesenteric, retroperitoneal, or pelvic pathologically enlarged lymph nodes by CT criteria. Reproductive: The prostate gland is again nodular and measures up to 5.9 cm in transverse dimension, moderately to markedly enlarged. The seminal vesicles are grossly  unremarkable. Other: Moderate left fat containing inguinal hernia. Mild ascites around the liver, within the pericolic gutters, and within the superior pelvis. Trace fluid around the spleen. Mild nondependent upper abdominal pneumoperitoneum consistent with recent surgery. Musculoskeletal: Vertebroplasty cement is again seen within the L1 vertebral body. Moderate to severe multilevel degenerative disc changes. Old mildly displaced posterior left twelfth and eleventh rib fractures, with interval healing/fusion of the posterior left eleventh rib fracture. IMPRESSION: 1. Interval surgery, apparent recent cholecystectomy. Mild fluid and air within the gallbladder fossa and mild pneumoperitoneum, consistent with this recent surgery. 2. Moderate to high-grade sigmoid and mild descending colon diverticulosis. There is mild postoperative ascites, however otherwise no definite colonic wall inflammatory changes are seen to indicate acute colonic diverticulitis. 3. Moderate fluid within the visualized distal esophagus with moderate fluid within the stomach. Please note given this fluid at the time of the CT scan, the patient may be at increased risk for aspiration. 4. Prostatomegaly. Electronically Signed: By: Yvonne Kendall M.D. On: 02/26/2022 11:10   CT Head Wo Contrast  Result Date: 02/26/2022 CLINICAL DATA:  Neuro deficit, acute, stroke suspected EXAM: CT HEAD WITHOUT CONTRAST TECHNIQUE: Contiguous axial images were obtained from the base of the skull through the vertex without intravenous contrast. RADIATION DOSE REDUCTION: This exam was performed according to the departmental dose-optimization program which includes automated exposure control, adjustment of the mA and/or kV according to patient size and/or use of iterative reconstruction technique. COMPARISON:  Head CT 08/01/2021. FINDINGS: Brain: No evidence of acute intracranial hemorrhage or abnormal extra-axial collection.There is encephalomalacia in the right  frontal and parietal lobes compatible with old infarct. Old left basal ganglia infarct.Ex vacuo dilation of the of lateral ventricles.Chronic small vessel ischemic changes.Mild cerebral atrophy Vascular: Vascular calcifications.  No hyperdense vessel. Skull: Negative for skull fracture. Sinuses/Orbits: Orbits are unremarkable. Paranasal sinuses are predominantly clear. Mastoid air cells are clear. Other: None. IMPRESSION: No acute intracranial abnormality. Chronic right MCA territory and left basal ganglia infarcts. Electronically Signed   By: Maurine Simmering M.D.   On: 02/26/2022 10:53    Pertinent labs & imaging results that were available during my care of the patient were  reviewed by me and considered in my medical decision making (see MDM for details).  Medications Ordered in ED Medications  bisacodyl (DULCOLAX) suppository 10 mg (has no administration in time range)  iohexol (OMNIPAQUE) 350 MG/ML injection 75 mL (75 mLs Intravenous Contrast Given 02/26/22 1044)  ondansetron (ZOFRAN) injection 4 mg (4 mg Intravenous Given 02/26/22 1136)  sodium chloride 0.9 % bolus 1,000 mL (1,000 mLs Intravenous New Bag/Given 02/26/22 1743)                                                                                                                                     Procedures Procedures  (including critical care time)  Medical Decision Making / ED Course   MDM:  81 year old male presenting to the emergency department with nausea.  Patient chronically ill-appearing, no acute distress.  Abdominal exam with mild diffuse tenderness.  Labs overall mostly reassuring, creatinine appears at baseline, does have new elevated WBC count.  No fevers or chills  Clinical Course as of 02/26/22 1826  Fri Feb 26, 2022  1744 Discussed with Dr. Kae Heller, who agrees with possible ileus, recommends admission to medicine. [WS]  1817 Discussed with the hospitalist, who will admit for ileus and near syncope.  [WS]     Clinical Course User Index [WS] Cristie Hem, MD     Additional history obtained: -Additional history obtained from ems and spouse -External records from outside source obtained and reviewed including: Chart review including previous notes, labs, imaging, consultation notes   Lab Tests: -I ordered, reviewed, and interpreted labs.   The pertinent results include:   Labs Reviewed  COMPREHENSIVE METABOLIC PANEL - Abnormal; Notable for the following components:      Result Value   Glucose, Bld 175 (*)    Creatinine, Ser 1.74 (*)    Total Protein 6.1 (*)    Albumin 2.7 (*)    ALT 46 (*)    Alkaline Phosphatase 219 (*)    GFR, Estimated 39 (*)    All other components within normal limits  CBC WITH DIFFERENTIAL/PLATELET - Abnormal; Notable for the following components:   WBC 12.9 (*)    RBC 3.60 (*)    Hemoglobin 11.6 (*)    HCT 35.7 (*)    Neutro Abs 10.5 (*)    Monocytes Absolute 1.1 (*)    All other components within normal limits  URINALYSIS, ROUTINE W REFLEX MICROSCOPIC  TROPONIN I (HIGH SENSITIVITY)      EKG   EKG Interpretation  Date/Time:  Friday February 26 2022 17:07:13 EDT Ventricular Rate:  78 PR Interval:  167 QRS Duration: 102 QT Interval:  381 QTC Calculation: 434 R Axis:   -51 Text Interpretation: Sinus rhythm Left anterior fascicular block Abnormal R-wave progression, early transition Left ventricular hypertrophy Anterior Q waves, possibly due to LVH Confirmed by Garnette Gunner [phone]) on 02/26/2022 5:16:29 PM         Imaging  Studies ordered: I ordered imaging studies including CT A/P and CT head On my interpretation imaging demonstrates possible ileus, some fluid around galbladder site I independently visualized and interpreted imaging. I agree with the radiologist interpretation   Medicines ordered and prescription drug management: Meds ordered this encounter  Medications   iohexol (OMNIPAQUE) 350 MG/ML injection 75 mL    ondansetron (ZOFRAN) injection 4 mg   sodium chloride 0.9 % bolus 1,000 mL   bisacodyl (DULCOLAX) suppository 10 mg    -I have reviewed the patients home medicines and have made adjustments as needed   Consultations Obtained: I requested consultation with the general surgeon,  and discussed lab and imaging findings as well as pertinent plan - they recommend: admission for further management, possible HIDA scan   Cardiac Monitoring: The patient was maintained on a cardiac monitor.  I personally viewed and interpreted the cardiac monitored which showed an underlying rhythm of: NSR  Social Determinants of Health:  Factors impacting patients care include: former smoker   Reevaluation: After the interventions noted above, I reevaluated the patient and found that they have stayed the same  Co morbidities that complicate the patient evaluation  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   HTN (hypertension) 07/19/2015   Hyperlipidemia    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   Mild, related to stroke history   Nocturia    Renal insufficiency 06/25/2013   S/P carotid endarterectomy    BILATERAL ICA--  PATENT PER DUPLEX  05-19-2012   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  Despite best  efforts to proofread,  errors can occur which can change the documentation meaning.    Cristie Hem, MD 02/26/22 [phone]

## 2022-02-26 NOTE — H&P (Addendum)
History and Physical    [name]:[id number] DOB: April 28, 1941 DOA: 02/26/2022  PCP: Ann Held, DO  Patient coming from: Home Chief Complaint: Emesis, orthostasis  HPI: William [name] is a 81 y.o. 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  History obtained from wife at bedside and patient.  Patient was recently admitted for cholecystectomy.  Post op, he did well, was able to eat on the day prior to discharge and the morning of discharge.  He had not had a BM yet.  On the evening of discharge, at home, he attempted to eat dinner and had a large emesis.  The next day he had an episode of near syncope where he was walking to the bathroom and had to sit down.  He was having gas passage and he ate in the ED and this has stayed down.  Surgical team was consulted.  Chronic symptoms include occasional confusion, dementia and left sided weakness.     ED Course: In the ED, His creatinine was noted to be 1.74, which is within baseline, ALT was 46, TnI was 28 (will be trended), WBC 12.9 with a neutrophilic predominance.  H/H improved to 11.6.  His WBC might be explained by intra-abdominal issue post op, urinary issues (asymptomatic).  CT scan of the abdomen showed fluid in the esophagus and stomach and post operative changes.  Also noted to have diverticulosis without diverticulitis.  Surgery team saw the patient and recommended sips only for medications, hold plavix.  If WBC remained elevated, recommended HIDA scan.   Review of Systems: As per HPI otherwise all other systems reviewed and are negative.   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   HTN (hypertension) 07/19/2015   Hyperlipidemia    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   Mild, related to stroke history   Nocturia    Renal insufficiency 06/25/2013   S/P carotid endarterectomy    BILATERAL ICA--  PATENT PER DUPLEX  05-19-2012   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   Right calf   Urinary frequency    Vitamin D deficiency     Past Surgical History:  Procedure Laterality Date   APPENDECTOMY  AS CHILD   CARDIOVASCULAR STRESS TEST  03-27-2012  DR CRENSHAW   LOW RISK LEXISCAN STUDY-- PROBABLE NORMAL PERFUSION AND SOFT TISSUE ATTENUATION/  NO ISCHEMIA/ EF 51%   CAROTID ENDARTERECTOMY Bilateral LEFT  11-12-2008  DR GREG HAYES   RIGHT ICA  2006  ([facility])   CHOLECYSTECTOMY N/A 02/23/2022   Procedure: LAPAROSCOPIC CHOLECYSTECTOMY;  Surgeon: Felicie Morn, MD;  Location: [facility];  Service: General;  Laterality: N/A;   CYSTOSCOPY W/ RETROGRADES Bilateral 06/22/2021   Procedure: CYSTOSCOPY WITH RETROGRADE PYELOGRAM;  Surgeon: Franchot Gallo, MD;  Location: [place];  Service: Urology;  Laterality: Bilateral;   CYSTOSCOPY WITH LITHOLAPAXY N/A 02/26/2013   Procedure:  CYSTOSCOPY WITH LITHOLAPAXY;  Surgeon: Franchot Gallo, MD;  Location: [facility];  Service: Urology;  Laterality: N/A;   ENDOSCOPIC RETROGRADE CHOLANGIOPANCREATOGRAPHY (ERCP) WITH PROPOFOL N/A 02/22/2022   Procedure: ENDOSCOPIC RETROGRADE CHOLANGIOPANCREATOGRAPHY (ERCP) WITH PROPOFOL;  Surgeon: Carol Ada, MD;  Location: [place];  Service: Gastroenterology;  Laterality: N/A;   EYE SURGERY  Jan. 2016   cataract surgery both eyes   INGUINAL HERNIA REPAIR Right 11-08-2006   IR KYPHO EA ADDL LEVEL THORACIC OR LUMBAR  02/12/2021   IR  RADIOLOGIST EVAL & MGMT  02/18/2021   MASS EXCISION N/A 03/03/2016   Procedure: EXCISION OF BACK  MASS;  Surgeon: Stark Klein, MD;  Location: [place];  Service: General;  Laterality: N/A;   MOHS SURGERY Left 1/ 2016   Dr Nevada Crane-- Basal cell   PROSTATE SURGERY     REMOVAL OF STONES  02/22/2022   Procedure: REMOVAL OF STONES;  Surgeon: Carol Ada, MD;  Location: [facility] END[facility]COPY;  Service: Gastroenterology;;   [name]  02/22/2022   Procedure: [name];  Surgeon: Carol Ada, MD;  Location: [facility] ENDOSCOPY;  Service: Gastroenterology;;   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-C N/A 06/22/2021   Procedure: TRANSURETHRAL RESECTION OF BLADDER TUMOR;  Surgeon: Franchot Gallo, MD;  Location: [facility];  Service: Urology;  Laterality: N/A;   TRANSURETHRAL RESECTION OF PROSTATE N/A 02/26/2013   Procedure: TRANSURETHRAL RESECTION OF THE PROSTATE WITH GYRUS INSTRUMENTS;  Surgeon: Franchot Gallo, MD;  Location: [facility];  Service: Urology;  Laterality: N/A;   TRANSURETHRAL RESECTION OF PROSTATE N/A 06/22/2021   Procedure: TRANSURETHRAL RESECTION OF THE PROSTATE (TURP);  Surgeon: Franchot Gallo, MD;  Location: [facility];  Service: Urology;  Laterality: N/A;    Social History  reports that he quit smoking about 17 years ago. His smoking use included cigarettes. He has a 80.00 pack-year smoking history. He has never used smokeless tobacco. He reports that he does not currently use alcohol. He reports that he does not use drugs.  Allergies  Allergen Reactions   Bee Venom Anaphylaxis   Strawberry Extract Hives   Latex Itching   Zetia [Ezetimibe] Other (See Comments)    Intolerance    Adhesive [Tape] Other (See Comments)    blisters   Statins Other (See Comments)    myalgias    Family History  Problem Relation Age of Onset   Heart disease Mother        CHF   Bipolar disorder Mother    Heart disease  Father        CHF     Prior to Admission medications   Medication Sig Start Date End Date Taking? Authorizing Provider  acetaminophen (TYLENOL) 325 MG tablet Take 2 tablets (650 mg total) by mouth every 6 (six) hours as needed for mild pain. 02/24/22   Winferd Humphrey, PA-C  amantadine (SYMMETREL) 100 MG capsule Take 1 capsule (100 mg total) by mouth daily. 01/04/22   Ann Held, DO  amLODipine (NORVASC) 10 MG tablet Take 1 tablet (10 mg total) by mouth every morning. 02/03/22   Carollee Herter, Alferd Apa, DO  carBAMazepine (TEGRETOL) 100 MG/5ML suspension Take 5 mLs (100 mg total) by mouth 3 (three) times daily. Patient taking differently: Take 100-200 mg by mouth 3 (three) times daily. Take 5 ml (100 mg) BID & Take 10 ml (200 mg) in the evening 10/05/21   Raulkar, Clide Deutscher, MD  Cholecalciferol (VITAMIN D3) 25 MCG (1000 UT)  CHEW Chew 1 tablet by mouth daily.    [provider]  clopidogrel (PLAVIX) 75 MG tablet Take 1 tablet (75 mg total) by mouth daily. 01/04/22   Ann Held, DO  clotrimazole-betamethasone (LOTRISONE) cream Apply 1 Application on to the skin daily. Patient taking differently: Apply 1 Application topically daily as needed (itching). 12/10/21   Roma Schanz R, DO  ferrous sulfate 325 (65 FE) MG EC tablet Take 325 mg by mouth daily with breakfast.    [provider]  fluticasone (CUTIVATE) 0.35 % cream 1 application daily as needed (psoriasis). 12/16/16   [provider]  hydrALAZINE (APRESOLINE) 25 MG tablet Take 1 tablet (25 mg total) by mouth every 8 (eight) hours. 01/04/22   Ann Held, DO  hydrOXYzine (VISTARIL) 25 MG capsule Take 1 capsule by mouth everyday at 3:30 PM as needed Patient taking differently: Take 25 mg by mouth daily as needed for anxiety. Take 1 capsule by mouth everyday PRN at 3:30 PM 01/26/22   Plovsky, Berneta Sages, MD  LORazepam (ATIVAN) 0.5 MG tablet Take 1/2 tablet (0.25 mg total) by mouth daily. 01/26/22    Plovsky, Berneta Sages, MD  LORazepam ER (LOREEV XR) 1 MG CS24 Take 1 mg (1 capsule) by mouth at bedtime. 01/26/22   Plovsky, Berneta Sages, MD  Multiple Vitamins-Minerals (ADULT ONE DAILY GUMMIES) CHEW Chew 1 tablet by mouth every morning.    [provider]  pantoprazole (PROTONIX) 40 MG tablet Take 1 tablet (40 mg total) by mouth daily. 09/15/21   Ann Held, DO  polyethylene glycol (MIRALAX / GLYCOLAX) 17 g packet Take 17 g by mouth daily. 10/02/21   Sherwood Gambler, MD  tamsulosin (FLOMAX) 0.4 MG CAPS capsule Take 1 capsule (0.4 mg total) by mouth daily after supper. 02/08/22   Ann Held, DO  traMADol (ULTRAM) 50 MG tablet Take 1 tablet (50 mg total) by mouth every 6 (six) hours as needed for up to 5 days for moderate pain or severe pain. 02/24/22 03/01/22  Winferd Humphrey, PA-C  traZODone (DESYREL) 150 MG tablet Take 1/2 tablet (75 mg total) by mouth at bedtime. 01/26/22   Plovsky, Berneta Sages, MD  triamcinolone cream (KENALOG) 0.1 % Apply 1 application topically 2 (two) times daily. Patient taking differently: Apply 1 Application topically 2 (two) times daily as needed (rash). 01/04/22   Ann Held, DO    Physical Exam: Vitals:   02/26/22 1823 02/26/22 1827 02/26/22 1915 02/26/22 1930  BP: (!) 151/69  (!) 150/72 (!) 140/75  Pulse: 77  83 81  Resp: 20  (!) 21 (!) 21  Temp: 98.1 F (36.7 C)     TempSrc: Oral     SpO2: 98%  97% 96%  Weight:  81.6 kg    Height:  '5\' 11"'$  (1.803 m)      Constitutional: NAD, calm, comfortable Eyes: lids and conjunctivae normal ENMT: Mucous membranes are dry.  Neck: normal, supple Respiratory: Clear in anterior lung fields, no wheezing, normal WOB Cardiovascular: Regular rate and rhythm, no murmurs, pulses intact in the radial and DP arteries bilaterally Abdomen: mildly distended, soft, NT.  Very rare bowel sounds, well healed surgical scars from cholecystectomy Musculoskeletal: no clubbing / cyanosis. Mildly decreased tone on the  left.  Skin: no rashes, lesions, ulcers on exposed skin.  Well healed surgical incisions.  Neurologic: Slowed mentation, 4/5 strength in the LUE and LLE.  Otherwise normal strength. This is all chronic from previous stroke.  Psychiatric: Slowed, alert, oriented to situation.     Labs on Admission: I have personally reviewed following labs and imaging studies  CBC: Recent Labs  Lab 02/22/22 0835 02/23/22 0328 02/24/22 0352 02/25/22 0536 02/26/22 0926  WBC 5.2 7.0 9.6 8.4 12.9*  NEUTROABS 3.0 4.7 8.0* 5.5 10.5*  HGB 10.8* 9.7* 10.3* 9.3* 11.6*  HCT 31.6* 29.6* 31.3* 27.7* 35.7*  MCV 96.3 97.7 99.4 99.3 99.2  PLT 172 175 169 154 027    Basic Metabolic Panel: Recent Labs  Lab 02/20/22 0742 02/21/22 0142 02/22/22 0835 02/23/22 0328 02/24/22 0352 02/25/22 0536 02/26/22 0926  NA 139 139 139 138 138 137 138  K 3.9 3.8 3.5 3.6 4.9 4.2 4.0  CL 108 107 104 105 106 104 104  CO2 '23 25 27 26 25 25 24  '$ GLUCOSE 103* 95 109* 128* 158* 115* 175*  BUN 26* '18 12 19 21 19 21  '$ CREATININE 1.64* 1.67* 1.65* 2.52* 2.33* 1.96* 1.74*  CALCIUM 8.7* 8.6* 9.0 8.7* 8.5* 8.4* 8.9  MG 1.9 1.8 1.8 1.8 2.0  --   --     GFR: Estimated Creatinine Clearance: 36.7 mL/min (A) (by C-G formula based on SCr of 1.74 mg/dL (H)).  Liver Function Tests: Recent Labs  Lab 02/22/22 0835 02/23/22 0328 02/24/22 0352 02/25/22 0536 02/26/22 0926  AST 34 25 45* 25 20  ALT 101* 76* 83* 57* 46*  ALKPHOS 367* 295* 293* 224* 219*  BILITOT 0.6 0.7 0.7 0.4 0.8  PROT 6.2* 5.8* 5.9* 5.7* 6.1*  ALBUMIN 3.1* 2.8* 2.8* 2.6* 2.7*    Urine analysis:    Component Value Date/Time   COLORURINE AMBER (A) 02/19/2022 1114   APPEARANCEUR CLEAR 02/19/2022 1114   APPEARANCEUR Clear 08/27/2021 0740   LABSPEC 1.018 02/19/2022 1114   PHURINE 6.0 02/19/2022 1114   GLUCOSEU NEGATIVE 02/19/2022 1114   HGBUR NEGATIVE 02/19/2022 1114   HGBUR negative 09/25/2009 1535   BILIRUBINUR NEGATIVE 02/19/2022 1114   BILIRUBINUR small  11/12/2021 1146   BILIRUBINUR Negative 08/27/2021 0740   KETONESUR NEGATIVE 02/19/2022 1114   PROTEINUR 30 (A) 02/19/2022 1114   UROBILINOGEN 0.2 11/12/2021 1146   UROBILINOGEN 0.2 09/25/2009 1535   NITRITE NEGATIVE 02/19/2022 1114   LEUKOCYTESUR NEGATIVE 02/19/2022 1114    Radiological Exams on Admission: CT ABDOMEN PELVIS W CONTRAST  Addendum Date: 02/26/2022   ADDENDUM REPORT: 02/26/2022 12:26 ADDENDUM: The IV contrast administered for this exam was 100 mL Omnipaque 300 IV Electronically Signed   By: Yvonne Kendall M.D.   On: 02/26/2022 12:26   Result Date: 02/26/2022 CLINICAL DATA:  Abdominal pain.  Postoperative. EXAM: CT ABDOMEN AND PELVIS WITH CONTRAST TECHNIQUE: Multidetector CT imaging of the abdomen and pelvis was performed using the standard protocol following bolus administration of intravenous contrast. RADIATION DOSE REDUCTION: This exam was performed according to the departmental dose-optimization program which includes automated exposure control, adjustment of the mA and/or kV according to patient size and/or use of iterative reconstruction technique. CONTRAST:  100 mL Omnipaque 300 IV COMPARISON:  MRI abdomen 02/20/2022; right upper quadrant abdominal ultrasound 02/19/2022; CT abdomen and pelvis 10/02/2021 FINDINGS: Lower chest: There is mild fluid within the visualized inferior aspect of the left major fissure. Mild posterior right lower lobe curvilinear subsegmental atelectasis. Heart size is again mildly enlarged. Mild calcifications within the visualized aortic root, similar to prior. No pericardial effusion. Hepatobiliary: There is diffuse decreased density again seen throughout the liver suggesting fatty infiltration. There are new cholecystectomy clips. There appears to be interval  resection of the prior gallstone-filled gallbladder. Within the gallbladder fossa there is scattered fluid and air density (axial series 1, images 25 through 30), presumably normal postoperative  findings at this time. No liver lesion is seen. No intrahepatic or extrahepatic biliary ductal dilatation. Pancreas: There is again mild fatty infiltration of the pancreatic head greater than the downstream pancreatic body. No pancreatic ductal dilatation is seen. Spleen: Normal in size without focal abnormality. Adrenals/Urinary Tract: Normal adrenals. The kidneys enhance uniformly and are symmetric in size. Multiple low-density renal lesions are again seen. Dominant multilobular fluid density cyst within the mid to upper pole of the right kidney measures up to 5.8 cm in transverse dimension and 5.8 cm in craniocaudal dimension, similar to prior. Additional smaller right renal fluid density cysts. There is a 13 mm left upper pole fluid density cyst, unchanged. No follow-up imaging recommended. No hydronephrosis. No focal urinary bladder wall thickening. Stomach/Bowel: Moderate to high-grade sigmoid and mild descending colon diverticulosis is again seen. Mild free fluid adjacent to the posteroinferior aspect of the proximal to mid sigmoid colon (axial series 1 images 86 through 91 and coronal series 9, image 60) is favored to be secondary to the postoperative ascites and not definite sigmoid diverticulitis. No definitive sigmoid colon inflammatory wall thickening. There is mild fluid around the descending colon which is new from 10/02/2021 CT, however the descending colon inflammatory wall thickening is no longer seen. The terminal ileum is unremarkable. The appendix is again not confidently identified. No definite inflammatory changes are seen around the cecum to indicate secondary signs of acute appendicitis. There is moderate fluid distention of the visualized distal esophagus, moderate to high-grade just above the gastroesophageal junction (axial image 20). There is moderate fluid throughout the patient's stomach. Note is made that patient may be at increased risk for aspiration given the distal esophageal  fluid. There are air-fluid levels within the proximal to mid small bowel. Mild small bowel dilatation up to approximately 3.4 cm (axial series 1, image 70). The more distal small bowel normally tapers to the level of the terminal ileum. No evidence of bowel obstruction. Vascular/Lymphatic: No abdominal aortic aneurysm. The major intra-abdominal aortic branch vessels are patent. Mild-to-moderate atherosclerotic calcifications. No mesenteric, retroperitoneal, or pelvic pathologically enlarged lymph nodes by CT criteria. Reproductive: The prostate gland is again nodular and measures up to 5.9 cm in transverse dimension, moderately to markedly enlarged. The seminal vesicles are grossly unremarkable. Other: Moderate left fat containing inguinal hernia. Mild ascites around the liver, within the pericolic gutters, and within the superior pelvis. Trace fluid around the spleen. Mild nondependent upper abdominal pneumoperitoneum consistent with recent surgery. Musculoskeletal: Vertebroplasty cement is again seen within the L1 vertebral body. Moderate to severe multilevel degenerative disc changes. Old mildly displaced posterior left twelfth and eleventh rib fractures, with interval healing/fusion of the posterior left eleventh rib fracture. IMPRESSION: 1. Interval surgery, apparent recent cholecystectomy. Mild fluid and air within the gallbladder fossa and mild pneumoperitoneum, consistent with this recent surgery. 2. Moderate to high-grade sigmoid and mild descending colon diverticulosis. There is mild postoperative ascites, however otherwise no definite colonic wall inflammatory changes are seen to indicate acute colonic diverticulitis. 3. Moderate fluid within the visualized distal esophagus with moderate fluid within the stomach. Please note given this fluid at the time of the CT scan, the patient may be at increased risk for aspiration. 4. Prostatomegaly. Electronically Signed: By: Yvonne Kendall M.D. On: 02/26/2022 11:10    CT Head Wo Contrast  Result Date:  02/26/2022 CLINICAL DATA:  Neuro deficit, acute, stroke suspected EXAM: CT HEAD WITHOUT CONTRAST TECHNIQUE: Contiguous axial images were obtained from the base of the skull through the vertex without intravenous contrast. RADIATION DOSE REDUCTION: This exam was performed according to the departmental dose-optimization program which includes automated exposure control, adjustment of the mA and/or kV according to patient size and/or use of iterative reconstruction technique. COMPARISON:  Head CT 08/01/2021. FINDINGS: Brain: No evidence of acute intracranial hemorrhage or abnormal extra-axial collection.There is encephalomalacia in the right frontal and parietal lobes compatible with old infarct. Old left basal ganglia infarct.Ex vacuo dilation of the of lateral ventricles.Chronic small vessel ischemic changes.Mild cerebral atrophy Vascular: Vascular calcifications.  No hyperdense vessel. Skull: Negative for skull fracture. Sinuses/Orbits: Orbits are unremarkable. Paranasal sinuses are predominantly clear. Mastoid air cells are clear. Other: None. IMPRESSION: No acute intracranial abnormality. Chronic right MCA territory and left basal ganglia infarcts. Electronically Signed   By: Maurine Simmering M.D.   On: 02/26/2022 10:53    EKG: Independently reviewed. Difficult to interpret baseline, poor R wave progression.  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  Assessment/Plan  Ileus, postoperative (HCC) Leukocytosis - Currently asymptomatic, no vomiting or pain - NGT if emesis - Follow up surgical recommendations - Trend WBC, if fever, would start Abx and order HIDA scan - Bowel rest - 1L bolus in the ED given - IVF with NS at 75cc/hr for 10 hours, reassess - Holding plavix - Dulcolax suppository per surgery team - Protonix changed to IV    History of right MCA infarct Hemiplegia, dominant side S/P CVA (cerebrovascular accident) ([place]) Cognitive impairment - Chronic - Meds with ice cream,  need to be crushed - Continue amantadine, tegretol, lorazepam, trazodone    HTN (hypertension) - Crushed medications, sips only - Continue amlodipine, hydralazine, tamsulosin    Hyperlipidemia - Monitor, not currently on therapy due to intolerance    Anxiety - Continue lorazepam at home doses - They have not started hydroxyzine outpatient    Chronic kidney disease (CKD), stage IV (severe) (HCC) - Cr at baseline - Monitor - Avoid nephrotoxins  BPH (benign prostatic hyperplasia) - Monitor UOP (I/Os) - Tamsulosin at home dose     DVT prophylaxis: SCDs  Code Status:   DNR  Family Communication:  Wife at bedside  Disposition Plan:   Patient is from:  Home  Anticipated DC to:  Home  Anticipated DC date:  02/27/22  Anticipated DC barriers: Need for further surgery  Consults called:  Surgery, Dr. Kae Heller  Admission status:  Obs, Med Surg   Severity of Illness: The appropriate patient status for this patient is OBSERVATION. Observation status is judged to be reasonable and necessary in order to provide the required intensity of service to ensure the patient's safety. The patient's presenting symptoms, physical exam findings, and initial radiographic and laboratory data in the context of their medical condition is felt to place them at decreased risk for further clinical deterioration. Furthermore, it is anticipated that the patient will be medically stable for discharge from the hospital within 2 midnights of admission.     Gilles Chiquito MD [facility]  How to contact the [facility] Attending or Consulting provider [place] or covering provider during after hours [place], for this patient?   Check the care team in [facility] and look for a) attending/consulting [facility] provider listed and b) the [facility] team listed Log into [email or web address] and use [place]'s universal password to access. If you do not have the password, please  contact the hospital operator. Locate the [facility] provider you are looking  for under [facility] and page to a number that you can be directly reached. If you still have difficulty reaching the provider, please page the [facility] (Director on Call) for the Hospitalists listed on amion for assistance.  02/26/2022, 8:56 PM

## 2022-02-26 NOTE — ED Triage Notes (Signed)
PT BIBGEMS coming from home. Pt had episode of AMS, reports weakness and that he felt like he would fall. Pt was originally slow to respond for EMS. Pt just d/c from hospital yesterday. Minor confusion at baseline. Baselines left sided deficits from previous stroke.  BP 148/80 Hr 96 NSR RR 30  02 97 RA CBG 195

## 2022-02-26 NOTE — ED Notes (Signed)
Pt endorses nausea and spitting up blood. VS updated and zofran given.

## 2022-02-26 NOTE — ED Provider Triage Note (Signed)
Emergency Medicine Provider Triage Evaluation Note  William [name] , a 81 y.o. male with history of CVA, CKD, and recent surgery for choledocholithiasis and cholelithiasis on 02/19/2022 was evaluated in triage.  Pt brought in by EMS.  Patient states that after he was sent home from his surgeries earlier this week, his appetite has been low.  He has not had a bowel movement since prior to his surgery.  She states that when he tried to eat some food yesterday, he projectile vomited.  She called his surgeon, Dr. Benson [name] who advised that she should try to get him to have a bowel movement.  This morning, he indicated that he needs to have a bowel movement so she helped him to the bathroom.  At some point, he had a near syncopal episode where his legs seem to give out from him and his face got a little droopy.  This lasted a few minutes and she called EMS for transport.  Patient has confusion at baseline and is not responding to questions  Review of Systems  Positive:  Negative:   Physical Exam  BP 108/71 (BP Location: Right Arm)   Pulse 94   Temp 98.6 F (37 C) (Oral)   Resp 18   SpO2 98%  Gen:   Awake, no distress   Resp:  Normal effort  MSK:   Moves extremities without difficulty  Other:  Eyes PERRL, no obvious facial droop.  Abdomen is soft, nondistended.  Surgical scars noted to the abdomen without evidence of infection  Medical Decision Making  Medically screening exam initiated at 9:21 AM.  Appropriate orders placed.  William [name] was informed that the remainder of the evaluation will be completed by another provider, this initial triage assessment does not replace that evaluation, and the importance of remaining in the ED until their evaluation is complete.     William [name], [place] 02/26/22 [phone]

## 2022-02-26 NOTE — Progress Notes (Addendum)
Progress Note     Subjective: Patient was discharged yesterday and returns this afternoon by EMS with episode of altered mental status, weakness, and presyncopal episode.  Endorsing nausea and possible hematemesis- single episode of large volume projectile emesis after dinner last night per his wife.  No bowel movement since surgery, despite dulcolax PO. Afebrile, no tachycardia, normotensive currently and saturating well on room air.  Labs this afternoon appear hemoconcentrated with a white count of 12.9 from 8.4 yesterday, hemoglobin of 11.6 from 9.3 yesterday, platelets 270 from 154.  Although his creatinine interestingly is slightly better than it was yesterday morning; his LFTs are slightly improved compared to yesterday.  CT with report below. I have reviewed these images personally; mild fluid and air in the gallbladder fossa which may be appropriate this soon after surgery, may be some of the hemostatic agent that was used per operative surgeon's note; mild postoperative ascites, likely ileus. He reports feeling somewhat better since IV fluids initiated.   Objective: Vital signs in last 24 hours: Temp:  [97.7 F (36.5 C)-98.6 F (37 C)] 98.2 F (36.8 C) (09/15 1658) Pulse Rate:  [79-94] 79 (09/15 1639) Resp:  [18-23] 23 (09/15 1639) BP: (101-137)/(61-75) 137/75 (09/15 1639) SpO2:  [96 %-100 %] 96 % (09/15 1639)    Intake/Output from previous day: No intake/output data recorded. Intake/Output this shift: Total I/O In: 500 [I.V.:500] Out: -   PE: Alert, calm, cooperative Unlabored respirations Abdomen soft, minimally distended. Incisions c/d/I with dermabond. TTP without peritoneal signs in left> right LOWER quadrants. No RUQ tenderness.    Lab Results:  Recent Labs    02/25/22 0536 02/26/22 0926  WBC 8.4 12.9*  HGB 9.3* 11.6*  HCT 27.7* 35.7*  PLT 154 270   BMET Recent Labs    02/25/22 0536 02/26/22 0926  NA 137 138  K 4.2 4.0  CL 104 104  CO2 25 24   GLUCOSE 115* 175*  BUN 19 21  CREATININE 1.96* 1.74*  CALCIUM 8.4* 8.9   PT/INR No results for input(s): "LABPROT", "INR" in the last 72 hours.  CMP     Component Value Date/Time   NA 138 02/26/2022 0926   NA 142 04/25/2019 1000   K 4.0 02/26/2022 0926   CL 104 02/26/2022 0926   CO2 24 02/26/2022 0926   GLUCOSE 175 (H) 02/26/2022 0926   BUN 21 02/26/2022 0926   BUN 30 (H) 04/25/2019 1000   CREATININE 1.74 (H) 02/26/2022 0926   CREATININE 1.71 (H) 12/25/2021 1433   CALCIUM 8.9 02/26/2022 0926   PROT 6.1 (L) 02/26/2022 0926   PROT 7.4 04/25/2019 1000   ALBUMIN 2.7 (L) 02/26/2022 0926   ALBUMIN 4.2 04/25/2019 1000   AST 20 02/26/2022 0926   ALT 46 (H) 02/26/2022 0926   ALKPHOS 219 (H) 02/26/2022 0926   BILITOT 0.8 02/26/2022 0926   BILITOT 0.3 04/25/2019 1000   GFRNONAA 39 (L) 02/26/2022 0926   GFRAA 34 (L) 04/25/2019 1000   Lipase     Component Value Date/Time   LIPASE 62 (H) 02/21/2022 0142       Studies/Results: CT ABDOMEN PELVIS W CONTRAST  Addendum Date: 02/26/2022   ADDENDUM REPORT: 02/26/2022 12:26 ADDENDUM: The IV contrast administered for this exam was 100 mL Omnipaque 300 IV Electronically Signed   By: Yvonne Kendall M.D.   On: 02/26/2022 12:26   Result Date: 02/26/2022 CLINICAL DATA:  Abdominal pain.  Postoperative. EXAM: CT ABDOMEN AND PELVIS WITH CONTRAST TECHNIQUE: Multidetector CT imaging  of the abdomen and pelvis was performed using the standard protocol following bolus administration of intravenous contrast. RADIATION DOSE REDUCTION: This exam was performed according to the departmental dose-optimization program which includes automated exposure control, adjustment of the mA and/or kV according to patient size and/or use of iterative reconstruction technique. CONTRAST:  100 mL Omnipaque 300 IV COMPARISON:  MRI abdomen 02/20/2022; right upper quadrant abdominal ultrasound 02/19/2022; CT abdomen and pelvis 10/02/2021 FINDINGS: Lower chest: There is mild  fluid within the visualized inferior aspect of the left major fissure. Mild posterior right lower lobe curvilinear subsegmental atelectasis. Heart size is again mildly enlarged. Mild calcifications within the visualized aortic root, similar to prior. No pericardial effusion. Hepatobiliary: There is diffuse decreased density again seen throughout the liver suggesting fatty infiltration. There are new cholecystectomy clips. There appears to be interval resection of the prior gallstone-filled gallbladder. Within the gallbladder fossa there is scattered fluid and air density (axial series 1, images 25 through 30), presumably normal postoperative findings at this time. No liver lesion is seen. No intrahepatic or extrahepatic biliary ductal dilatation. Pancreas: There is again mild fatty infiltration of the pancreatic head greater than the downstream pancreatic body. No pancreatic ductal dilatation is seen. Spleen: Normal in size without focal abnormality. Adrenals/Urinary Tract: Normal adrenals. The kidneys enhance uniformly and are symmetric in size. Multiple low-density renal lesions are again seen. Dominant multilobular fluid density cyst within the mid to upper pole of the right kidney measures up to 5.8 cm in transverse dimension and 5.8 cm in craniocaudal dimension, similar to prior. Additional smaller right renal fluid density cysts. There is a 13 mm left upper pole fluid density cyst, unchanged. No follow-up imaging recommended. No hydronephrosis. No focal urinary bladder wall thickening. Stomach/Bowel: Moderate to high-grade sigmoid and mild descending colon diverticulosis is again seen. Mild free fluid adjacent to the posteroinferior aspect of the proximal to mid sigmoid colon (axial series 1 images 86 through 91 and coronal series 9, image 60) is favored to be secondary to the postoperative ascites and not definite sigmoid diverticulitis. No definitive sigmoid colon inflammatory wall thickening. There is mild  fluid around the descending colon which is new from 10/02/2021 CT, however the descending colon inflammatory wall thickening is no longer seen. The terminal ileum is unremarkable. The appendix is again not confidently identified. No definite inflammatory changes are seen around the cecum to indicate secondary signs of acute appendicitis. There is moderate fluid distention of the visualized distal esophagus, moderate to high-grade just above the gastroesophageal junction (axial image 20). There is moderate fluid throughout the patient's stomach. Note is made that patient may be at increased risk for aspiration given the distal esophageal fluid. There are air-fluid levels within the proximal to mid small bowel. Mild small bowel dilatation up to approximately 3.4 cm (axial series 1, image 70). The more distal small bowel normally tapers to the level of the terminal ileum. No evidence of bowel obstruction. Vascular/Lymphatic: No abdominal aortic aneurysm. The major intra-abdominal aortic branch vessels are patent. Mild-to-moderate atherosclerotic calcifications. No mesenteric, retroperitoneal, or pelvic pathologically enlarged lymph nodes by CT criteria. Reproductive: The prostate gland is again nodular and measures up to 5.9 cm in transverse dimension, moderately to markedly enlarged. The seminal vesicles are grossly unremarkable. Other: Moderate left fat containing inguinal hernia. Mild ascites around the liver, within the pericolic gutters, and within the superior pelvis. Trace fluid around the spleen. Mild nondependent upper abdominal pneumoperitoneum consistent with recent surgery. Musculoskeletal: Vertebroplasty cement is again seen  within the L1 vertebral body. Moderate to severe multilevel degenerative disc changes. Old mildly displaced posterior left twelfth and eleventh rib fractures, with interval healing/fusion of the posterior left eleventh rib fracture. IMPRESSION: 1. Interval surgery, apparent recent  cholecystectomy. Mild fluid and air within the gallbladder fossa and mild pneumoperitoneum, consistent with this recent surgery. 2. Moderate to high-grade sigmoid and mild descending colon diverticulosis. There is mild postoperative ascites, however otherwise no definite colonic wall inflammatory changes are seen to indicate acute colonic diverticulitis. 3. Moderate fluid within the visualized distal esophagus with moderate fluid within the stomach. Please note given this fluid at the time of the CT scan, the patient may be at increased risk for aspiration. 4. Prostatomegaly. Electronically Signed: By: Yvonne Kendall M.D. On: 02/26/2022 11:10   CT Head Wo Contrast  Result Date: 02/26/2022 CLINICAL DATA:  Neuro deficit, acute, stroke suspected EXAM: CT HEAD WITHOUT CONTRAST TECHNIQUE: Contiguous axial images were obtained from the base of the skull through the vertex without intravenous contrast. RADIATION DOSE REDUCTION: This exam was performed according to the departmental dose-optimization program which includes automated exposure control, adjustment of the mA and/or kV according to patient size and/or use of iterative reconstruction technique. COMPARISON:  Head CT 08/01/2021. FINDINGS: Brain: No evidence of acute intracranial hemorrhage or abnormal extra-axial collection.There is encephalomalacia in the right frontal and parietal lobes compatible with old infarct. Old left basal ganglia infarct.Ex vacuo dilation of the of lateral ventricles.Chronic small vessel ischemic changes.Mild cerebral atrophy Vascular: Vascular calcifications.  No hyperdense vessel. Skull: Negative for skull fracture. Sinuses/Orbits: Orbits are unremarkable. Paranasal sinuses are predominantly clear. Mastoid air cells are clear. Other: None. IMPRESSION: No acute intracranial abnormality. Chronic right MCA territory and left basal ganglia infarcts. Electronically Signed   By: Maurine Simmering M.D.   On: 02/26/2022 10:53     Anti-infectives: Anti-infectives (From admission, onward)    None        Assessment/Plan  Choledocholithiasis POD3 lap chole Dr. Thermon Leyland 9/12 -MRCP with cholelithiasis and multiple mid to distal CBD stones.  Pancreas grossly unremarkable in setting of elevated lipase -ERCP 9/11. Stones removed and sphincterotomy performed -Lap chole 9/12, discharged 9/14  -Returns with symptoms of ileus, CT noting some air and fluid in the gallbladder fossa, significance unclear at this time.  Recommend medical admission for fluid resuscitation, symptom directed supportive care, recheck labs.  If white count remains elevated would proceed with HIDA to rule out bile leak. Would stick with sips of liquids only for now, hold plavix. Fair amount of stool in rectum- will try dulcolax suppository.   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

## 2022-02-26 NOTE — Patient Outreach (Signed)
  Care Coordination TOC Note Transition Care Management Unsuccessful Follow-up Telephone Call  Date of discharge and from where:  [facility] on 02/25/22  Attempts:  1st Attempt  Reason for unsuccessful TCM follow-up call:  No answer/busy. Currently admitted to ED.  Chong Sicilian, BSN, RN-BC RN Care Coordinator [place] Direct Dial: [phone] Main #: [phone]

## 2022-02-27 ENCOUNTER — Observation Stay (HOSPITAL_COMMUNITY): Payer: PPO

## 2022-02-27 DIAGNOSIS — Z4682 Encounter for fitting and adjustment of non-vascular catheter: Secondary | ICD-10-CM | POA: Diagnosis not present

## 2022-02-27 DIAGNOSIS — K567 Ileus, unspecified: Secondary | ICD-10-CM | POA: Diagnosis present

## 2022-02-27 DIAGNOSIS — D509 Iron deficiency anemia, unspecified: Secondary | ICD-10-CM | POA: Diagnosis present

## 2022-02-27 DIAGNOSIS — F419 Anxiety disorder, unspecified: Secondary | ICD-10-CM

## 2022-02-27 DIAGNOSIS — K3189 Other diseases of stomach and duodenum: Secondary | ICD-10-CM | POA: Diagnosis not present

## 2022-02-27 DIAGNOSIS — F01A4 Vascular dementia, mild, with anxiety: Secondary | ICD-10-CM | POA: Diagnosis present

## 2022-02-27 DIAGNOSIS — N171 Acute kidney failure with acute cortical necrosis: Secondary | ICD-10-CM | POA: Diagnosis not present

## 2022-02-27 DIAGNOSIS — K56600 Partial intestinal obstruction, unspecified as to cause: Secondary | ICD-10-CM | POA: Diagnosis present

## 2022-02-27 DIAGNOSIS — I129 Hypertensive chronic kidney disease with stage 1 through stage 4 chronic kidney disease, or unspecified chronic kidney disease: Secondary | ICD-10-CM | POA: Diagnosis present

## 2022-02-27 DIAGNOSIS — E872 Acidosis, unspecified: Secondary | ICD-10-CM | POA: Diagnosis not present

## 2022-02-27 DIAGNOSIS — E870 Hyperosmolality and hypernatremia: Secondary | ICD-10-CM | POA: Diagnosis not present

## 2022-02-27 DIAGNOSIS — K8050 Calculus of bile duct without cholangitis or cholecystitis without obstruction: Secondary | ICD-10-CM

## 2022-02-27 DIAGNOSIS — M47816 Spondylosis without myelopathy or radiculopathy, lumbar region: Secondary | ICD-10-CM | POA: Diagnosis present

## 2022-02-27 DIAGNOSIS — N179 Acute kidney failure, unspecified: Secondary | ICD-10-CM | POA: Diagnosis present

## 2022-02-27 DIAGNOSIS — N1832 Chronic kidney disease, stage 3b: Secondary | ICD-10-CM

## 2022-02-27 DIAGNOSIS — F0390 Unspecified dementia without behavioral disturbance: Secondary | ICD-10-CM | POA: Diagnosis not present

## 2022-02-27 DIAGNOSIS — N401 Enlarged prostate with lower urinary tract symptoms: Secondary | ICD-10-CM | POA: Diagnosis not present

## 2022-02-27 DIAGNOSIS — E559 Vitamin D deficiency, unspecified: Secondary | ICD-10-CM | POA: Diagnosis present

## 2022-02-27 DIAGNOSIS — I63511 Cerebral infarction due to unspecified occlusion or stenosis of right middle cerebral artery: Secondary | ICD-10-CM

## 2022-02-27 DIAGNOSIS — Z85828 Personal history of other malignant neoplasm of skin: Secondary | ICD-10-CM | POA: Diagnosis not present

## 2022-02-27 DIAGNOSIS — E876 Hypokalemia: Secondary | ICD-10-CM | POA: Diagnosis not present

## 2022-02-27 DIAGNOSIS — M7989 Other specified soft tissue disorders: Secondary | ICD-10-CM | POA: Diagnosis not present

## 2022-02-27 DIAGNOSIS — L309 Dermatitis, unspecified: Secondary | ICD-10-CM | POA: Diagnosis present

## 2022-02-27 DIAGNOSIS — K5989 Other specified functional intestinal disorders: Secondary | ICD-10-CM | POA: Diagnosis not present

## 2022-02-27 DIAGNOSIS — Z87891 Personal history of nicotine dependence: Secondary | ICD-10-CM | POA: Diagnosis not present

## 2022-02-27 DIAGNOSIS — I69354 Hemiplegia and hemiparesis following cerebral infarction affecting left non-dominant side: Secondary | ICD-10-CM | POA: Diagnosis not present

## 2022-02-27 DIAGNOSIS — E785 Hyperlipidemia, unspecified: Secondary | ICD-10-CM | POA: Diagnosis present

## 2022-02-27 DIAGNOSIS — R609 Edema, unspecified: Secondary | ICD-10-CM | POA: Diagnosis not present

## 2022-02-27 DIAGNOSIS — Z8551 Personal history of malignant neoplasm of bladder: Secondary | ICD-10-CM | POA: Diagnosis not present

## 2022-02-27 DIAGNOSIS — I69359 Hemiplegia and hemiparesis following cerebral infarction affecting unspecified side: Secondary | ICD-10-CM

## 2022-02-27 DIAGNOSIS — K219 Gastro-esophageal reflux disease without esophagitis: Secondary | ICD-10-CM | POA: Diagnosis present

## 2022-02-27 DIAGNOSIS — Z66 Do not resuscitate: Secondary | ICD-10-CM

## 2022-02-27 DIAGNOSIS — F411 Generalized anxiety disorder: Secondary | ICD-10-CM | POA: Diagnosis present

## 2022-02-27 DIAGNOSIS — I10 Essential (primary) hypertension: Secondary | ICD-10-CM

## 2022-02-27 DIAGNOSIS — E440 Moderate protein-calorie malnutrition: Secondary | ICD-10-CM | POA: Diagnosis not present

## 2022-02-27 DIAGNOSIS — K9189 Other postprocedural complications and disorders of digestive system: Secondary | ICD-10-CM | POA: Diagnosis present

## 2022-02-27 DIAGNOSIS — Z9049 Acquired absence of other specified parts of digestive tract: Secondary | ICD-10-CM | POA: Diagnosis not present

## 2022-02-27 LAB — BASIC METABOLIC PANEL
Anion gap: 11 (ref 5–15)
BUN: 41 mg/dL — ABNORMAL HIGH (ref 8–23)
CO2: 20 mmol/L — ABNORMAL LOW (ref 22–32)
Calcium: 8.5 mg/dL — ABNORMAL LOW (ref 8.9–10.3)
Chloride: 107 mmol/L (ref 98–111)
Creatinine, Ser: 2.25 mg/dL — ABNORMAL HIGH (ref 0.61–1.24)
GFR, Estimated: 29 mL/min — ABNORMAL LOW (ref >60)
Glucose, Bld: 161 mg/dL — ABNORMAL HIGH (ref 70–99)
Potassium: 4.1 mmol/L (ref 3.5–5.1)
Sodium: 138 mmol/L (ref 135–145)

## 2022-02-27 LAB — CBC
HCT: 28.5 % — ABNORMAL LOW (ref 39.0–52.0)
Hemoglobin: 9.8 g/dL — ABNORMAL LOW (ref 13.0–17.0)
MCH: 33 pg (ref 26.0–34.0)
MCHC: 34.4 g/dL (ref 30.0–36.0)
MCV: 96 fL (ref 80.0–100.0)
Platelets: 231 10*3/uL (ref 150–400)
RBC: 2.97 MIL/uL — ABNORMAL LOW (ref 4.22–5.81)
RDW: 13.4 % (ref 11.5–15.5)
WBC: 11.6 10*3/uL — ABNORMAL HIGH (ref 4.0–10.5)
nRBC: 0 % (ref 0.0–0.2)

## 2022-02-27 LAB — URINALYSIS, ROUTINE W REFLEX MICROSCOPIC
Bilirubin Urine: NEGATIVE
Glucose, UA: NEGATIVE mg/dL
Hgb urine dipstick: NEGATIVE
Ketones, ur: NEGATIVE mg/dL
Leukocytes,Ua: NEGATIVE
Nitrite: NEGATIVE
Protein, ur: 30 mg/dL — AB
Specific Gravity, Urine: >1.046 — ABNORMAL HIGH (ref 1.005–1.030)
pH: 5 (ref 5.0–8.0)

## 2022-02-27 LAB — GLUCOSE, CAPILLARY: Glucose-Capillary: 143 mg/dL — ABNORMAL HIGH (ref 70–99)

## 2022-02-27 LAB — HEPATIC FUNCTION PANEL
ALT: 34 U/L (ref 0–44)
AST: 20 U/L (ref 15–41)
Albumin: 2.4 g/dL — ABNORMAL LOW (ref 3.5–5.0)
Alkaline Phosphatase: 148 U/L — ABNORMAL HIGH (ref 38–126)
Bilirubin, Direct: 0.3 mg/dL — ABNORMAL HIGH (ref 0.0–0.2)
Indirect Bilirubin: 0.7 mg/dL (ref 0.3–0.9)
Total Bilirubin: 1 mg/dL (ref 0.3–1.2)
Total Protein: 5.5 g/dL — ABNORMAL LOW (ref 6.5–8.1)

## 2022-02-27 LAB — TSH: TSH: 0.836 u[IU]/mL (ref 0.350–4.500)

## 2022-02-27 MED ORDER — LORAZEPAM 1 MG PO TABS
1.0000 mg | ORAL_TABLET | Freq: Every day | ORAL | Status: DC
  Filled 2022-02-27: qty 1

## 2022-02-27 MED ORDER — BISACODYL 10 MG RE SUPP
10.0000 mg | Freq: Every day | RECTAL | Status: DC
  Administered 2022-02-27 – 2022-03-12 (×11): 10 mg via RECTAL
  Filled 2022-02-27 (×13): qty 1

## 2022-02-27 MED ORDER — ORAL CARE MOUTH RINSE: 15.0000 mL | OROMUCOSAL | Status: DC | PRN

## 2022-02-27 MED ORDER — SODIUM CHLORIDE 0.9 % IV SOLN: INTRAVENOUS | Status: DC

## 2022-02-27 MED ORDER — ORAL CARE MOUTH RINSE
15.0000 mL | OROMUCOSAL | Status: DC
  Administered 2022-02-27 – 2022-03-12 (×44): 15 mL via OROMUCOSAL

## 2022-02-27 MED ORDER — LORAZEPAM 2 MG/ML IJ SOLN: 0.5000 mg | Freq: Four times a day (QID) | INTRAMUSCULAR | Status: DC | PRN

## 2022-02-27 MED ORDER — LABETALOL HCL 5 MG/ML IV SOLN
10.0000 mg | INTRAVENOUS | Status: DC | PRN
  Administered 2022-03-01 – 2022-03-08 (×5): 10 mg via INTRAVENOUS
  Filled 2022-02-27 (×6): qty 4

## 2022-02-27 MED ORDER — LORAZEPAM 2 MG/ML IJ SOLN
0.5000 mg | Freq: Two times a day (BID) | INTRAMUSCULAR | Status: DC
  Administered 2022-02-27 – 2022-03-12 (×26): 0.5 mg via INTRAVENOUS
  Filled 2022-02-27 (×26): qty 1

## 2022-02-27 NOTE — Progress Notes (Signed)
   02/27/22 0300  Assess: MEWS Score  Temp 98.7 F (37.1 C)  BP (!) 141/73  MAP (mmHg) 91  Pulse Rate (!) 113  Resp 17  SpO2 96 %  O2 Device Room Air  Assess: MEWS Score  MEWS Temp 0  MEWS Systolic 0  MEWS Pulse 2  MEWS RR 0  MEWS LOC 0  MEWS Score 2  MEWS Score Color Yellow  Assess: if the MEWS score is Yellow or Red  Were vital signs taken at a resting state? Yes  Focused Assessment No change from prior assessment  Does the patient meet 2 or more of the SIRS criteria? Yes  Does the patient have a confirmed or suspected source of infection? Yes  Provider and Rapid Response Notified? Yes (MD-[place], Rapid-[place])  MEWS guidelines implemented *See Row Information* Yes  Treat  Pain Scale 0-10  Pain Score 0  Take Vital Signs  Increase Vital Sign Frequency  Yellow: Q 2hr X 2 then Q 4hr X 2, if remains yellow, continue Q 4hrs  Escalate  MEWS: Escalate Yellow: discuss with charge nurse/RN and consider discussing with provider and RRT  Notify: Charge Nurse/RN  Name of Charge Nurse/RN Notified Iona Beard RN  Date Charge Nurse/RN Notified 02/27/22  Time Charge Nurse/RN Notified 0315 (NT did not tell this RN until around 0315)  Notify: Provider  Provider Name/Title [facility]  Date Provider Notified 02/27/22  Time Provider Notified [phone]  Method of Notification Page  Notification Reason Other (Comment) (yellow mews)  Notify: Rapid Response  Name of Rapid Response RN Notified [place]  Date Rapid Response Notified 02/27/22  Time Rapid Response Notified [id number]  Document  Patient Outcome Other (Comment) (pt is stable)  Progress note created (see row info) Yes  Assess: SIRS CRITERIA  SIRS Temperature  0  SIRS Pulse 1  SIRS Respirations  0  SIRS WBC 1  SIRS Score Sum  2

## 2022-02-27 NOTE — Progress Notes (Signed)
[facility] Progress Note     Subjective: CC-  Wife at bedside.  NG in but not working well. Flushed it and got nearly an entire canister of brown effluent.  States that he is passing flatus. No BM. Labs pending this morning.  Objective: Vital signs in last 24 hours: Temp:  [97.7 F (36.5 C)-99.5 F (37.5 C)] 98.6 F (37 C) (09/16 0843) Pulse Rate:  [77-117] 114 (09/16 0843) Resp:  [16-24] 16 (09/16 0843) BP: (101-161)/(61-102) 137/71 (09/16 0843) SpO2:  [92 %-100 %] 92 % (09/16 0843) Weight:  [81.6 kg] 81.6 kg (09/15 1827)    Intake/Output from previous day: 09/15 0701 - 09/16 0700 In: 1500.1 [I.V.:500; IV Piggyback:1000.1] Out: 50 [Emesis/NG output:50] Intake/Output this shift: Total I/O In: -  Out: 1100 [Emesis/NG output:1100]  PE: Gen:  Alert, NAD, pleasant Pulm:  rate and effort normal Abd: soft, mild distension, nontender, lap incisions cdi  Lab Results:  Recent Labs    02/25/22 0536 02/26/22 0926  WBC 8.4 12.9*  HGB 9.3* 11.6*  HCT 27.7* 35.7*  PLT 154 270   BMET Recent Labs    02/25/22 0536 02/26/22 0926  NA 137 138  K 4.2 4.0  CL 104 104  CO2 25 24  GLUCOSE 115* 175*  BUN 19 21  CREATININE 1.96* 1.74*  CALCIUM 8.4* 8.9   PT/INR No results for input(s): "LABPROT", "INR" in the last 72 hours. CMP     Component Value Date/Time   NA 138 02/26/2022 0926   NA 142 04/25/2019 1000   K 4.0 02/26/2022 0926   CL 104 02/26/2022 0926   CO2 24 02/26/2022 0926   GLUCOSE 175 (H) 02/26/2022 0926   BUN 21 02/26/2022 0926   BUN 30 (H) 04/25/2019 1000   CREATININE 1.74 (H) 02/26/2022 0926   CREATININE 1.71 (H) 12/25/2021 1433   CALCIUM 8.9 02/26/2022 0926   PROT 6.1 (L) 02/26/2022 0926   PROT 7.4 04/25/2019 1000   ALBUMIN 2.7 (L) 02/26/2022 0926   ALBUMIN 4.2 04/25/2019 1000   AST 20 02/26/2022 0926   ALT 46 (H) 02/26/2022 0926   ALKPHOS 219 (H) 02/26/2022 0926   BILITOT 0.8 02/26/2022 0926   BILITOT 0.3 04/25/2019 1000    GFRNONAA 39 (L) 02/26/2022 0926   GFRAA 34 (L) 04/25/2019 1000   Lipase     Component Value Date/Time   LIPASE 62 (H) 02/21/2022 0142       Studies/Results: DG Abd 1 View  Result Date: 02/27/2022 CLINICAL DATA:  81 year old male with recent cholecystectomy. Mildly dilated small bowel on CT Abdomen and Pelvis yesterday. EXAM: ABDOMEN - 1 VIEW COMPARISON:  CT Abdomen and Pelvis 02/26/2022 and earlier. FINDINGS: Portable AP supine views at 0759 hours. New enteric tube terminates in the gastric body. Stable cholecystectomy clips. The proximal stomach appears more decompressed now. The distal stomach is still mildly distended. Gas distended small bowel loops are stable to mildly progressed throughout the abdomen. Excreted IV contrast in the urinary bladder. Paucity of large bowel gas. Stable lung bases. No definite pneumoperitoneum on these supine views. Stable visualized osseous structures. IMPRESSION: 1. New enteric tube terminates in the gastric body. Proximal stomach appears more decompressed, distal stomach since somewhat dilated. 2. Stable to mildly progressed small-bowel ileus versus small-bowel obstruction since the CT yesterday. No pneumoperitoneum evident on these supine views. Electronically Signed   By: Genevie Ann M.D.   On: 02/27/2022 08:39   CT ABDOMEN PELVIS W CONTRAST  Addendum Date: 02/26/2022  ADDENDUM REPORT: 02/26/2022 12:26 ADDENDUM: The IV contrast administered for this exam was 100 mL Omnipaque 300 IV Electronically Signed   By: Yvonne Kendall M.D.   On: 02/26/2022 12:26   Result Date: 02/26/2022 CLINICAL DATA:  Abdominal pain.  Postoperative. EXAM: CT ABDOMEN AND PELVIS WITH CONTRAST TECHNIQUE: Multidetector CT imaging of the abdomen and pelvis was performed using the standard protocol following bolus administration of intravenous contrast. RADIATION DOSE REDUCTION: This exam was performed according to the departmental dose-optimization program which includes automated exposure  control, adjustment of the mA and/or kV according to patient size and/or use of iterative reconstruction technique. CONTRAST:  100 mL Omnipaque 300 IV COMPARISON:  MRI abdomen 02/20/2022; right upper quadrant abdominal ultrasound 02/19/2022; CT abdomen and pelvis 10/02/2021 FINDINGS: Lower chest: There is mild fluid within the visualized inferior aspect of the left major fissure. Mild posterior right lower lobe curvilinear subsegmental atelectasis. Heart size is again mildly enlarged. Mild calcifications within the visualized aortic root, similar to prior. No pericardial effusion. Hepatobiliary: There is diffuse decreased density again seen throughout the liver suggesting fatty infiltration. There are new cholecystectomy clips. There appears to be interval resection of the prior gallstone-filled gallbladder. Within the gallbladder fossa there is scattered fluid and air density (axial series 1, images 25 through 30), presumably normal postoperative findings at this time. No liver lesion is seen. No intrahepatic or extrahepatic biliary ductal dilatation. Pancreas: There is again mild fatty infiltration of the pancreatic head greater than the downstream pancreatic body. No pancreatic ductal dilatation is seen. Spleen: Normal in size without focal abnormality. Adrenals/Urinary Tract: Normal adrenals. The kidneys enhance uniformly and are symmetric in size. Multiple low-density renal lesions are again seen. Dominant multilobular fluid density cyst within the mid to upper pole of the right kidney measures up to 5.8 cm in transverse dimension and 5.8 cm in craniocaudal dimension, similar to prior. Additional smaller right renal fluid density cysts. There is a 13 mm left upper pole fluid density cyst, unchanged. No follow-up imaging recommended. No hydronephrosis. No focal urinary bladder wall thickening. Stomach/Bowel: Moderate to high-grade sigmoid and mild descending colon diverticulosis is again seen. Mild free fluid  adjacent to the posteroinferior aspect of the proximal to mid sigmoid colon (axial series 1 images 86 through 91 and coronal series 9, image 60) is favored to be secondary to the postoperative ascites and not definite sigmoid diverticulitis. No definitive sigmoid colon inflammatory wall thickening. There is mild fluid around the descending colon which is new from 10/02/2021 CT, however the descending colon inflammatory wall thickening is no longer seen. The terminal ileum is unremarkable. The appendix is again not confidently identified. No definite inflammatory changes are seen around the cecum to indicate secondary signs of acute appendicitis. There is moderate fluid distention of the visualized distal esophagus, moderate to high-grade just above the gastroesophageal junction (axial image 20). There is moderate fluid throughout the patient's stomach. Note is made that patient may be at increased risk for aspiration given the distal esophageal fluid. There are air-fluid levels within the proximal to mid small bowel. Mild small bowel dilatation up to approximately 3.4 cm (axial series 1, image 70). The more distal small bowel normally tapers to the level of the terminal ileum. No evidence of bowel obstruction. Vascular/Lymphatic: No abdominal aortic aneurysm. The major intra-abdominal aortic branch vessels are patent. Mild-to-moderate atherosclerotic calcifications. No mesenteric, retroperitoneal, or pelvic pathologically enlarged lymph nodes by CT criteria. Reproductive: The prostate gland is again nodular and measures up to 5.9  cm in transverse dimension, moderately to markedly enlarged. The seminal vesicles are grossly unremarkable. Other: Moderate left fat containing inguinal hernia. Mild ascites around the liver, within the pericolic gutters, and within the superior pelvis. Trace fluid around the spleen. Mild nondependent upper abdominal pneumoperitoneum consistent with recent surgery. Musculoskeletal:  Vertebroplasty cement is again seen within the L1 vertebral body. Moderate to severe multilevel degenerative disc changes. Old mildly displaced posterior left twelfth and eleventh rib fractures, with interval healing/fusion of the posterior left eleventh rib fracture. IMPRESSION: 1. Interval surgery, apparent recent cholecystectomy. Mild fluid and air within the gallbladder fossa and mild pneumoperitoneum, consistent with this recent surgery. 2. Moderate to high-grade sigmoid and mild descending colon diverticulosis. There is mild postoperative ascites, however otherwise no definite colonic wall inflammatory changes are seen to indicate acute colonic diverticulitis. 3. Moderate fluid within the visualized distal esophagus with moderate fluid within the stomach. Please note given this fluid at the time of the CT scan, the patient may be at increased risk for aspiration. 4. Prostatomegaly. Electronically Signed: By: Yvonne Kendall M.D. On: 02/26/2022 11:10   CT Head Wo Contrast  Result Date: 02/26/2022 CLINICAL DATA:  Neuro deficit, acute, stroke suspected EXAM: CT HEAD WITHOUT CONTRAST TECHNIQUE: Contiguous axial images were obtained from the base of the skull through the vertex without intravenous contrast. RADIATION DOSE REDUCTION: This exam was performed according to the departmental dose-optimization program which includes automated exposure control, adjustment of the mA and/or kV according to patient size and/or use of iterative reconstruction technique. COMPARISON:  Head CT 08/01/2021. FINDINGS: Brain: No evidence of acute intracranial hemorrhage or abnormal extra-axial collection.There is encephalomalacia in the right frontal and parietal lobes compatible with old infarct. Old left basal ganglia infarct.Ex vacuo dilation of the of lateral ventricles.Chronic small vessel ischemic changes.Mild cerebral atrophy Vascular: Vascular calcifications.  No hyperdense vessel. Skull: Negative for skull fracture.  Sinuses/Orbits: Orbits are unremarkable. Paranasal sinuses are predominantly clear. Mastoid air cells are clear. Other: None. IMPRESSION: No acute intracranial abnormality. Chronic right MCA territory and left basal ganglia infarcts. Electronically Signed   By: Maurine Simmering M.D.   On: 02/26/2022 10:53    Anti-infectives: Anti-infectives (From admission, onward)    None        Assessment/Plan Choledocholithiasis POD#4 lap chole Dr. Thermon Leyland 9/12 -MRCP with cholelithiasis and multiple mid to distal CBD stones.  Pancreas grossly unremarkable in setting of elevated lipase -ERCP 9/11. Stones removed and sphincterotomy performed -Lap chole 9/12, discharged 9/14 -Readmit 9/15 with ileus -CT noting some air and fluid in the gallbladder fossa, significance unclear at this time but may be normal/expected postop. Follow up LFTs and CBC this morning. If elevated may need HIDA - Continue NG to LIWS. Daily suppositories. Mobilize, PT consult.  Please hold plavix.    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

## 2022-02-27 NOTE — Progress Notes (Signed)
Inpatient Rehab Admissions Coordinator:   Per therapy recommendations, patient was screened for CIR candidacy by Clemens Catholic, MS, CCC-SLP. At this time, Pt. does not appear to demonstrate medical necessity to justify in hospital rehabilitation/CIR. And payor will not approve for this dx. I will not pursue a rehab consult for this Pt.   Recommend other rehab venues to be pursued.  Please contact me with any questions.  Clemens Catholic, [place], [place] Admissions Coordinator  [phone] ([place]) [phone] (office)

## 2022-02-27 NOTE — Progress Notes (Signed)
PROGRESS NOTE  William [name] NOB:[id number] DOB: Jun 24, 1940   PCP: Ann Held, DO  Patient is from: Home.  Lives with his wife.  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  Ileus.   CT abdomen and pelvis with fluid in esophagus, stomach and postoperative changes and concern for ileus.  Surgical team consulted.  Patient was started on IV fluid and eventually needed NG tube.  Subjective: Seen and examined earlier this morning.  No major events overnight of this morning.  Patient's wife at bedside.  Reports abdominal pain and cough.  No shortness of breath.  While concerned about holding his Plavix although she understands the need.  LBM before his ERCP on 9/11.  Not sure about flatus.  Objective: Vitals:   02/27/22 0300 02/27/22 0458 02/27/22 0843 02/27/22 1131  BP: (!) 141/73 (!) 123/102 137/71 129/60  Pulse: (!) 113 (!) 117 (!) 114 (!) 110  Resp: '17 18 16 16  '$ Temp: 98.7 F (37.1 C) 98.4 F (36.9 C) 98.6 F (37 C) 99.3 F (37.4 C)  TempSrc: Oral Oral Oral Oral  SpO2: 96% 95% 92% 92%  Weight:      Height:        Examination:  GENERAL: No apparent distress.  Nontoxic. HEENT: MMM.  Vision and hearing grossly intact.  NGT in place. NECK: Supple.  No apparent JVD.  RESP:  No IWOB.  Fair aeration bilaterally. CVS:  RRR. Heart sounds normal.  ABD/GI/GU: BS--. Abd soft but tender. MSK/EXT:  Moves extremities. No apparent deformity. No edema.  SKIN: no apparent skin lesion or wound NEURO: Awake and alert.  Fairly oriented.  Chronic left-sided weakness, 3/5.  PSYCH: Calm. Normal affect.    Procedures:  9/15-NG tube insertion  Microbiology summarized: None  Assessment and plan: Principal Problem:   Ileus, postoperative (HCC) Active Problems:   BPH (benign prostatic hyperplasia)   History of right MCA infarct   Major vascular neurocognitive disorder   HTN (hypertension)   Hyperlipidemia   Anxiety   Acute 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 (HCC)   Hemiplegia, dominant side S/P CVA (cerebrovascular accident) ([place])   Choledocholithiasis   DNR (do not resuscitate)   Choledocholithiasis s/p ERCP on 9/11 and lap chole on 9/12.  Discharged on 9/14. Postoperative ileus:  Returns with emesis, presyncope and abdominal pain.  CT abdomen showed moderate fluid within distal esophagus and stomach.  Abdominal x-ray from this morning with stable to mildly progressed small bowel ileus versus SBO.  No bowel movement since prior to surgery.  Not sure about passing gas.  Diminished bowel sounds.  Tender to palpation. -Surgery managing -Continue NPO, NGT and IV fluid.  Increased IV fluid rate to 125 cc an hour. -Daily suppository -Discontinued nonessential meds and change exam to IV -Continue holding Plavix per general surgery -PT and mobility take to mobilize patient    AKI on CKD-3B: Baseline Cr 1.6-1.7.  AKI likely due to the above. Recent Labs    12/25/21 1433 02/19/22 1118 02/20/22 0742 02/21/22 0142 02/22/22 9476 02/23/22 0328 02/24/22 0352 02/25/22 0536 02/26/22 0926 02/27/22 1019  BUN 25 31* 26* 18  $'12 19 21 19 21 'h$ 41*  CREATININE 1.71* 1.64* 1.64* 1.67* 1.65* 2.52* 2.33* 1.96* 1.74* 2.25*  -Increase IVF to 125 cc an hour -Avoid nephrotoxic meds -Continue monitoring -Strict intake and output  History of right MCA infarct with residual left hemiparesis and cognitive impairment: Stable. -Holding Plavix in the setting of the above -Continue amantadine, tegretol, lorazepam, trazodone -PT/OT    Essential hypertension: Normotensive. -  Crushed medications, sips only - Continue amlodipine, hydralazine, tamsulosin    Hyperlipidemia - Monitor, not currently on therapy due to intolerance    Anxiety -Continue lorazepam at home doses -They have not started hydroxyzine outpatient   BPH -Monitor UOP (I/Os) -Tamsulosin at home dose  Elevated troponin: Mild.  Likely delayed clearance  Leukocytosis/bandemia: Likely demargination versus infection.  Improved.   Body mass index is 25.1 kg/m.          DVT prophylaxis:  Place and maintain sequential compression device Start: 02/26/22 2141  Code Status: DNR/DNI Family Communication: Updated patient's wife at bedside Level of care: Med-Surg Status is: Observation The patient will require care spanning > 2 midnights and should be moved to inpatient because: Persistent postoperative ileus requiring NG tube and IV fluid   Final disposition: Likely home once medically stable Consultants:  General surgery  Sch Meds:  Scheduled Meds:  amantadine  100 mg Oral Daily   bisacodyl  10 mg Rectal Daily   carBAMazepine  100 mg Oral 2 times per day   carBAMazepine  200 mg Oral Q2200   ferrous sulfate  325 mg Oral Q breakfast   LORazepam  0.5 mg Intravenous BID   mouth rinse  15 mL Mouth Rinse 4 times per day   pantoprazole (PROTONIX) IV  40 mg Intravenous Q24H   tamsulosin  0.4 mg Oral QPC supper   traZODone  75 mg Oral QHS   Continuous Infusions:  sodium chloride 125 mL/hr at 02/27/22 1136   methocarbamol (ROBAXIN) IV     PRN Meds:.acetaminophen, labetalol, methocarbamol (ROBAXIN) IV, ondansetron **OR** ondansetron (ZOFRAN) IV, mouth rinse  Antimicrobials: Anti-infectives (From admission, onward)    None        I have personally reviewed the following labs and images: CBC: Recent Labs  Lab 02/22/22 0835 02/23/22 0328 02/24/22 0352 02/25/22 0536 02/26/22 0926 02/27/22 1019  WBC 5.2 7.0 9.6 8.4 12.9* 11.6*  NEUTROABS 3.0 4.7 8.0* 5.5 10.5*  --   HGB  10.8* 9.7* 10.3* 9.3* 11.6* 9.8*  HCT 31.6* 29.6* 31.3* 27.7* 35.7* 28.5*  MCV 96.3 97.7 99.4 99.3 99.2 96.0  PLT 172 175 169 154 270 231   BMP &GFR Recent Labs  Lab 02/21/22 0142 02/22/22 0835 02/23/22 0328 02/24/22 0352 02/25/22 0536 02/26/22 0926 02/27/22 1019  NA 139 139 138 138 137 138 138  K 3.8 3.5 3.6 4.9 4.2 4.0 4.1  CL 107 104 105 106 104 104 107  CO2 '25 27 26 25 25 24 '$ 20*  GLUCOSE 95 109* 128* 158* 115* 175* 161*  BUN '18 12 19 21 19 21 '$ 41*  CREATININE 1.67* 1.65* 2.52* 2.33* 1.96* 1.74* 2.25*  CALCIUM 8.6* 9.0 8.7* 8.5* 8.4* 8.9 8.5*  MG 1.8 1.8 1.8 2.0  --   --   --    Estimated Creatinine Clearance: 28.4 mL/min (A) (by C-G formula based on SCr of 2.25 mg/dL (H)). Liver & Pancreas: Recent Labs  Lab 02/23/22 0328 02/24/22 0352 02/25/22 0536 02/26/22 0926 02/27/22 1019  AST 25 45* '25 20 20  '$ ALT 76* 83*  57* 46* 34  ALKPHOS 295* 293* 224* 219* 148*  BILITOT 0.7 0.7 0.4 0.8 1.0  PROT 5.8* 5.9* 5.7* 6.1* 5.5*  ALBUMIN 2.8* 2.8* 2.6* 2.7* 2.4*   Recent Labs  Lab 02/21/22 0142  LIPASE 62*   No results for input(s): "AMMONIA" in the last 168 hours. Diabetic: No results for input(s): "HGBA1C" in the last 72 hours. Recent Labs  Lab 02/21/22 0830 02/21/22 1132 02/21/22 1625 02/21/22 2117  GLUCAP 98 157* 129* 125*   Cardiac Enzymes: No results for input(s): "CKTOTAL", "CKMB", "CKMBINDEX", "TROPONINI" in the last 168 hours. No results for input(s): "PROBNP" in the last 8760 hours. Coagulation Profile: No results for input(s): "INR", "PROTIME" in the last 168 hours. Thyroid Function Tests: Recent Labs    02/27/22 1019  TSH 0.836   Lipid Profile: No results for input(s): "CHOL", "HDL", "LDLCALC", "TRIG", "CHOLHDL", "LDLDIRECT" in the last 72 hours. Anemia Panel: No results for input(s): "VITAMINB12", "FOLATE", "FERRITIN", "TIBC", "IRON", "RETICCTPCT" in the last 72 hours. Urine analysis:    Component Value Date/Time   COLORURINE YELLOW 02/27/2022  0500   APPEARANCEUR HAZY (A) 02/27/2022 0500   APPEARANCEUR Clear 08/27/2021 0740   LABSPEC >1.046 (H) 02/27/2022 0500   PHURINE 5.0 02/27/2022 0500   GLUCOSEU NEGATIVE 02/27/2022 0500   HGBUR NEGATIVE 02/27/2022 0500   HGBUR negative 09/25/2009 1535   BILIRUBINUR NEGATIVE 02/27/2022 0500   BILIRUBINUR small 11/12/2021 1146   BILIRUBINUR Negative 08/27/2021 0740   KETONESUR NEGATIVE 02/27/2022 0500   PROTEINUR 30 (A) 02/27/2022 0500   UROBILINOGEN 0.2 11/12/2021 1146   UROBILINOGEN 0.2 09/25/2009 1535   NITRITE NEGATIVE 02/27/2022 0500   LEUKOCYTESUR NEGATIVE 02/27/2022 0500   Sepsis Labs: Invalid input(s): "PROCALCITONIN", "LACTICIDVEN"  Microbiology: No results found for this or any previous visit (from the past 240 hour(s)).  Radiology Studies: DG Abd 1 View  Result Date: 02/27/2022 CLINICAL DATA:  81 year old male with recent cholecystectomy. Mildly dilated small bowel on CT Abdomen and Pelvis yesterday. EXAM: ABDOMEN - 1 VIEW COMPARISON:  CT Abdomen and Pelvis 02/26/2022 and earlier. FINDINGS: Portable AP supine views at 0759 hours. New enteric tube terminates in the gastric body. Stable cholecystectomy clips. The proximal stomach appears more decompressed now. The distal stomach is still mildly distended. Gas distended small bowel loops are stable to mildly progressed throughout the abdomen. Excreted IV contrast in the urinary bladder. Paucity of large bowel gas. Stable lung bases. No definite pneumoperitoneum on these supine views. Stable visualized osseous structures. IMPRESSION: 1. New enteric tube terminates in the gastric body. Proximal stomach appears more decompressed, distal stomach since somewhat dilated. 2. Stable to mildly progressed small-bowel ileus versus small-bowel obstruction since the CT yesterday. No pneumoperitoneum evident on these supine views. Electronically Signed   By: Genevie Ann M.D.   On: 02/27/2022 08:39      Ariyan Sinnett T. Harriman  If  7PM-7AM, please contact night-coverage [email or web address] 02/27/2022, 11:55 AM

## 2022-02-27 NOTE — Progress Notes (Signed)
Pt has vomited again. RN has already given IV Zofran and it is ineffective. RN messaged MD on call. MD put in order for gastric tube.  William [name]

## 2022-02-27 NOTE — Progress Notes (Signed)
New Admission Note:  Arrival Method: By stretcher around 2330 Mental Orientation: Alert and oriented x 2, periods of confusion Telemetry: None Assessment: Completed Skin: Completed, refer to flowsheets IV: Left forearm Pain: Denies Tubes: None Safety Measures: Safety Fall Prevention Plan was given, discussed and signed. Admission: Completed [facility] Orientation: Patient has been orientated to the room, unit and the staff. Family: Wife at bedside  Orders have been reviewed and implemented. Will continue to monitor the patient. Call light has been placed within reach and bed alarm has been activated.   Fara Olden, RN  Phone Number: [phone]

## 2022-02-27 NOTE — Evaluation (Signed)
Physical Therapy Evaluation Patient Details Name: William [name] MRN: [id number] DOB: December 01, 1940 Today's Date: 02/27/2022  History of Present Illness  81 yo male returns to [facility] after ERCP and lap chole with abdominal pain, emesis, presyncope and found to have ileus. NG tube placed 9/16.  PMHx:  stroke with L  hemiparesis, CKD3, HTN, vascular dementia, bladder cancer, choledocholithiasis s/p ERCP  Clinical Impression  Pt admitted with above diagnosis. Pt sitting EOB upon PT entry, needing to have BM. Assisted in pivoting to [facility] with mod A +2. Pt coordination deficits increased from his baseline. Pt ambulated 6' with heavy R lean and quick fatigue. Needed mod A +2 for all OOB mobility. Pt has been quite inactive since ERCP. Expect he could return to baseline LOF with therapy. Recommend CIR to be able to return home with wife.  Pt currently with functional limitations due to the deficits listed below (see PT Problem List). Pt will benefit from skilled PT to increase their independence and safety with mobility to allow discharge to the venue listed below.          Recommendations for follow up therapy are one component of a multi-disciplinary discharge planning process, led by the attending physician.  Recommendations may be updated based on patient status, additional functional criteria and insurance authorization.  Follow Up Recommendations Acute inpatient rehab (3hours/day) Can patient physically be transported by private vehicle: No    Assistance Recommended at Discharge Frequent or constant Supervision/Assistance  Patient can return home with the following  Direct supervision/assist for medications management;Direct supervision/assist for financial management;Assist for transportation;Help with stairs or ramp for entrance;Two people to help with walking and/or transfers;Two people to help with bathing/dressing/bathroom    Equipment Recommendations None recommended by PT  Recommendations for  Other Services  Rehab consult;OT consult    Functional Status Assessment Patient has had a recent decline in their functional status and demonstrates the ability to make significant improvements in function in a reasonable and predictable amount of time.     Precautions / Restrictions Precautions Precautions: Fall Precaution Comments: L hemiparesis Restrictions Weight Bearing Restrictions: No Other Position/Activity Restrictions: per wife has L clavicle fx, non-op      Mobility  Bed Mobility               General bed mobility comments: sitting EOB upon PT entry    Transfers Overall transfer level: Needs assistance Equipment used: 2 person hand held assist, Rolling walker (2 wheels) Transfers: Sit to/from Stand, Bed to chair/wheelchair/BSC Sit to Stand: Mod assist, +2 safety/equipment   Step pivot transfers: Mod assist, +2 physical assistance       General transfer comment: mod A +2 for power up. With step pivot, pt had difficulty wt shifting to step to R, needed facilitation at hips.    Ambulation/Gait Ambulation/Gait assistance: Mod assist, +2 physical assistance Gait Distance (Feet): 6 Feet Assistive device: Rolling walker (2 wheels) Gait Pattern/deviations: Step-to pattern, Decreased step length - right, Decreased step length - left, Narrow base of support, Shuffle, Decreased weight shift to right Gait velocity: grossly <0.2 m/s Gait velocity interpretation: <1.31 ft/sec, indicative of household ambulator   General Gait Details: pt leans heavily to R.  step length short and shuffled with low foot clearance throughout. pt with great difficulty coordinating longer steps. Fatigued very quickly and needed to sit  Financial trader Rankin (Stroke Patients Only) Modified Rankin (Stroke  Patients Only) Pre-Morbid Rankin Score: Moderately severe disability (hx CVA) Modified Rankin: Severe disability     Balance Overall  balance assessment: Needs assistance Sitting-balance support: Feet supported Sitting balance-Leahy Scale: Poor Sitting balance - Comments: min/ mod A to maintain sitting due to R lean Postural control: Right lateral lean Standing balance support: Bilateral upper extremity supported Standing balance-Leahy Scale: Poor Standing balance comment: reliant on external W and UE support                             Pertinent Vitals/Pain Pain Assessment Pain Assessment: Faces Faces Pain Scale: Hurts little more Pain Location: nose Pain Descriptors / Indicators: Discomfort Pain Intervention(s): Monitored during session, Limited activity within patient's tolerance    Home Living Family/patient expects to be discharged to:: Private residence Living Arrangements: Spouse/significant other Available Help at Discharge: Family Type of Home: House Home Access: Stairs to enter Entrance Stairs-Rails: None Entrance Stairs-Number of Steps: 1   Home Layout: One level Home Equipment: Cane - single point;Grab bars - tub/shower;Wheelchair - Systems analyst (2 wheels) Additional Comments: Wife is home with pt. Before surgery pt was able to ambulate without AD with supervision. Since then he has needed HHA due to difficulty managing RW or SPC    Prior Function Prior Level of Function : Needs assist  Cognitive Assist : Mobility (cognitive) Mobility (Cognitive): Intermittent cues   Physical Assist : Mobility (physical) Mobility (physical): Gait;Transfers;Stairs   Mobility Comments: minimal ambulation since surgery ADLs Comments: steps into shower per wife     Hand Dominance   Dominant Hand: Right    Extremity/Trunk Assessment   Upper Extremity Assessment Upper Extremity Assessment: Defer to OT evaluation;RUE deficits/detail RUE Deficits / Details: generalized weakness and R lean and keeping R elbow bent in standing, seemed more of a coordination deficits than  weakness though LUE Deficits / Details: dislocated L clavicle, proximal vs distal weakness from prior CVA. Poor coordination L hand LUE Coordination: decreased gross motor;decreased fine motor    Lower Extremity Assessment Lower Extremity Assessment: Generalized weakness;RLE deficits/detail LLE Deficits / Details: grossly 3-/5 with poor coordination in standing LLE Coordination: decreased gross motor    Cervical / Trunk Assessment Cervical / Trunk Assessment: Normal  Communication   Communication: Expressive difficulties;Receptive difficulties  Cognition Arousal/Alertness: Awake/alert Behavior During Therapy: Flat affect, Impulsive Overall Cognitive Status: History of cognitive impairments - at baseline                                 General Comments: needing step by step verbal and tactile cues to complete with increased time to perform all tasks        General Comments General comments (skin integrity, edema, etc.): VSS. Pt able to have small BM on [facility] before ambulation    Exercises     Assessment/Plan    PT Assessment Patient needs continued PT services  PT Problem List Decreased strength;Decreased activity tolerance;Decreased balance;Decreased mobility;Decreased coordination;Decreased cognition;Decreased knowledge of use of DME;Decreased safety awareness       PT Treatment Interventions Gait training;Functional mobility training;Therapeutic activities;Therapeutic exercise;Balance training;Neuromuscular re-education;Patient/family education    PT Goals (Current goals can be found in the Care Plan section)  Acute Rehab PT Goals Patient Stated Goal: none stated PT Goal Formulation: With family Time For Goal Achievement: 03/20/22 Potential to Achieve Goals: Good    Frequency Min 3X/week  Co-evaluation               AM-PAC PT "6 Clicks" Mobility  Outcome Measure Help needed turning from your back to your side while in a flat bed without  using bedrails?: A Lot Help needed moving from lying on your back to sitting on the side of a flat bed without using bedrails?: A Lot Help needed moving to and from a bed to a chair (including a wheelchair)?: Total Help needed standing up from a chair using your arms (e.g., wheelchair or bedside chair)?: Total Help needed to walk in hospital room?: Total Help needed climbing 3-5 steps with a railing? 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

## 2022-02-27 NOTE — Progress Notes (Signed)
Pt is a yellow mews d/t pulse of 113. Pt denies pain, but is still very nauseous. RN messaged MD on call to make aware of yellow mews and to inform again about patient's nausea. RN awaiting response. RN will continue to monitor pt.   William [name]

## 2022-02-27 NOTE — Progress Notes (Signed)
NG tube is placed. Pt tolerated well. RN awaiting x-ray to confirm placement before beginning low-intermittent wall suction, as per order.   William [name] William [name]

## 2022-02-27 NOTE — Progress Notes (Signed)
Pt is unable to take anything po. Pt threw up medication as soon as it hit his mouth. Pt is still feeling nauseous, but it is too soon for Zofran. RN messaged MD on call to see if another medication can be given IV. Awaiting response.   William [name]

## 2022-02-28 ENCOUNTER — Inpatient Hospital Stay (HOSPITAL_COMMUNITY): Payer: PPO

## 2022-02-28 DIAGNOSIS — K9189 Other postprocedural complications and disorders of digestive system: Secondary | ICD-10-CM | POA: Diagnosis not present

## 2022-02-28 DIAGNOSIS — I63511 Cerebral infarction due to unspecified occlusion or stenosis of right middle cerebral artery: Secondary | ICD-10-CM | POA: Diagnosis not present

## 2022-02-28 DIAGNOSIS — D649 Anemia, unspecified: Secondary | ICD-10-CM

## 2022-02-28 DIAGNOSIS — F0390 Unspecified dementia without behavioral disturbance: Secondary | ICD-10-CM | POA: Diagnosis not present

## 2022-02-28 DIAGNOSIS — N179 Acute kidney failure, unspecified: Secondary | ICD-10-CM | POA: Diagnosis not present

## 2022-02-28 LAB — COMPREHENSIVE METABOLIC PANEL
ALT: 28 U/L (ref 0–44)
AST: 19 U/L (ref 15–41)
Albumin: 2.3 g/dL — ABNORMAL LOW (ref 3.5–5.0)
Alkaline Phosphatase: 123 U/L (ref 38–126)
Anion gap: 9 (ref 5–15)
BUN: 40 mg/dL — ABNORMAL HIGH (ref 8–23)
CO2: 23 mmol/L (ref 22–32)
Calcium: 8.2 mg/dL — ABNORMAL LOW (ref 8.9–10.3)
Chloride: 110 mmol/L (ref 98–111)
Creatinine, Ser: 2.17 mg/dL — ABNORMAL HIGH (ref 0.61–1.24)
GFR, Estimated: 30 mL/min — ABNORMAL LOW (ref >60)
Glucose, Bld: 125 mg/dL — ABNORMAL HIGH (ref 70–99)
Potassium: 3.6 mmol/L (ref 3.5–5.1)
Sodium: 142 mmol/L (ref 135–145)
Total Bilirubin: 0.6 mg/dL (ref 0.3–1.2)
Total Protein: 5.3 g/dL — ABNORMAL LOW (ref 6.5–8.1)

## 2022-02-28 LAB — CBC
HCT: 24 % — ABNORMAL LOW (ref 39.0–52.0)
Hemoglobin: 8.2 g/dL — ABNORMAL LOW (ref 13.0–17.0)
MCH: 33.5 pg (ref 26.0–34.0)
MCHC: 34.2 g/dL (ref 30.0–36.0)
MCV: 98 fL (ref 80.0–100.0)
Platelets: 168 10*3/uL (ref 150–400)
RBC: 2.45 MIL/uL — ABNORMAL LOW (ref 4.22–5.81)
RDW: 13.5 % (ref 11.5–15.5)
WBC: 8.5 10*3/uL (ref 4.0–10.5)
nRBC: 0 % (ref 0.0–0.2)

## 2022-02-28 LAB — MAGNESIUM: Magnesium: 1.8 mg/dL (ref 1.7–2.4)

## 2022-02-28 LAB — PHOSPHORUS: Phosphorus: 3.2 mg/dL (ref 2.5–4.6)

## 2022-02-28 MED ORDER — DIATRIZOATE MEGLUMINE & SODIUM 66-10 % PO SOLN
90.0000 mL | Freq: Once | ORAL | Status: AC
  Administered 2022-02-28: 90 mL via NASOGASTRIC
  Filled 2022-02-28: qty 90

## 2022-02-28 NOTE — Progress Notes (Signed)
[facility] Progress Note     Subjective: CC-  Abdominal pain resolved when NG to LIWS. NG clamped x30 minutes for medications and he complains of diffuse abdominal pain during this time. Passing flatus and had 3 documented Bms yesterday. NG with 2L out. Labs pending.  Objective: Vital signs in last 24 hours: Temp:  [98 F (36.7 C)-99.3 F (37.4 C)] 98.5 F (36.9 C) (09/17 0521) Pulse Rate:  [86-110] 94 (09/17 0521) Resp:  [15-18] 18 (09/17 0521) BP: (118-135)/(53-73) 134/73 (09/17 0521) SpO2:  [91 %-97 %] 97 % (09/17 0521) Last BM Date : 02/27/22  Intake/Output from previous day: 09/16 0701 - 09/17 0700 In: 2521.5 [I.V.:2521.5] Out: 2525 [Urine:525; Emesis/NG output:2000] Intake/Output this shift: Total I/O In: 127.1 [I.V.:127.1] Out: -   PE: Gen:  Alert, NAD, pleasant Pulm:  rate and effort normal Abd: soft, minimal distension, mild diffuse tenderness and no peritonitis, lap incisions cdi  Lab Results:  Recent Labs    02/26/22 0926 02/27/22 1019  WBC 12.9* 11.6*  HGB 11.6* 9.8*  HCT 35.7* 28.5*  PLT 270 231   BMET Recent Labs    02/26/22 0926 02/27/22 1019  NA 138 138  K 4.0 4.1  CL 104 107  CO2 24 20*  GLUCOSE 175* 161*  BUN 21 41*  CREATININE 1.74* 2.25*  CALCIUM 8.9 8.5*   PT/INR No results for input(s): "LABPROT", "INR" in the last 72 hours. CMP     Component Value Date/Time   NA 138 02/27/2022 1019   NA 142 04/25/2019 1000   K 4.1 02/27/2022 1019   CL 107 02/27/2022 1019   CO2 20 (L) 02/27/2022 1019   GLUCOSE 161 (H) 02/27/2022 1019   BUN 41 (H) 02/27/2022 1019   BUN 30 (H) 04/25/2019 1000   CREATININE 2.25 (H) 02/27/2022 1019   CREATININE 1.71 (H) 12/25/2021 1433   CALCIUM 8.5 (L) 02/27/2022 1019   PROT 5.5 (L) 02/27/2022 1019   PROT 7.4 04/25/2019 1000   ALBUMIN 2.4 (L) 02/27/2022 1019   ALBUMIN 4.2 04/25/2019 1000   AST 20 02/27/2022 1019   ALT 34 02/27/2022 1019   ALKPHOS 148 (H) 02/27/2022 1019   BILITOT 1.0  02/27/2022 1019   BILITOT 0.3 04/25/2019 1000   GFRNONAA 29 (L) 02/27/2022 1019   GFRAA 34 (L) 04/25/2019 1000   Lipase     Component Value Date/Time   LIPASE 62 (H) 02/21/2022 0142       Studies/Results: DG Abd 1 View  Result Date: 02/27/2022 CLINICAL DATA:  81 year old male with recent cholecystectomy. Mildly dilated small bowel on CT Abdomen and Pelvis yesterday. EXAM: ABDOMEN - 1 VIEW COMPARISON:  CT Abdomen and Pelvis 02/26/2022 and earlier. FINDINGS: Portable AP supine views at 0759 hours. New enteric tube terminates in the gastric body. Stable cholecystectomy clips. The proximal stomach appears more decompressed now. The distal stomach is still mildly distended. Gas distended small bowel loops are stable to mildly progressed throughout the abdomen. Excreted IV contrast in the urinary bladder. Paucity of large bowel gas. Stable lung bases. No definite pneumoperitoneum on these supine views. Stable visualized osseous structures. IMPRESSION: 1. New enteric tube terminates in the gastric body. Proximal stomach appears more decompressed, distal stomach since somewhat dilated. 2. Stable to mildly progressed small-bowel ileus versus small-bowel obstruction since the CT yesterday. No pneumoperitoneum evident on these supine views. Electronically Signed   By: Genevie Ann M.D.   On: 02/27/2022 08:39   CT ABDOMEN PELVIS W CONTRAST  Addendum Date:  02/26/2022   ADDENDUM REPORT: 02/26/2022 12:26 ADDENDUM: The IV contrast administered for this exam was 100 mL Omnipaque 300 IV Electronically Signed   By: Yvonne Kendall M.D.   On: 02/26/2022 12:26   Result Date: 02/26/2022 CLINICAL DATA:  Abdominal pain.  Postoperative. EXAM: CT ABDOMEN AND PELVIS WITH CONTRAST TECHNIQUE: Multidetector CT imaging of the abdomen and pelvis was performed using the standard protocol following bolus administration of intravenous contrast. RADIATION DOSE REDUCTION: This exam was performed according to the departmental  dose-optimization program which includes automated exposure control, adjustment of the mA and/or kV according to patient size and/or use of iterative reconstruction technique. CONTRAST:  100 mL Omnipaque 300 IV COMPARISON:  MRI abdomen 02/20/2022; right upper quadrant abdominal ultrasound 02/19/2022; CT abdomen and pelvis 10/02/2021 FINDINGS: Lower chest: There is mild fluid within the visualized inferior aspect of the left major fissure. Mild posterior right lower lobe curvilinear subsegmental atelectasis. Heart size is again mildly enlarged. Mild calcifications within the visualized aortic root, similar to prior. No pericardial effusion. Hepatobiliary: There is diffuse decreased density again seen throughout the liver suggesting fatty infiltration. There are new cholecystectomy clips. There appears to be interval resection of the prior gallstone-filled gallbladder. Within the gallbladder fossa there is scattered fluid and air density (axial series 1, images 25 through 30), presumably normal postoperative findings at this time. No liver lesion is seen. No intrahepatic or extrahepatic biliary ductal dilatation. Pancreas: There is again mild fatty infiltration of the pancreatic head greater than the downstream pancreatic body. No pancreatic ductal dilatation is seen. Spleen: Normal in size without focal abnormality. Adrenals/Urinary Tract: Normal adrenals. The kidneys enhance uniformly and are symmetric in size. Multiple low-density renal lesions are again seen. Dominant multilobular fluid density cyst within the mid to upper pole of the right kidney measures up to 5.8 cm in transverse dimension and 5.8 cm in craniocaudal dimension, similar to prior. Additional smaller right renal fluid density cysts. There is a 13 mm left upper pole fluid density cyst, unchanged. No follow-up imaging recommended. No hydronephrosis. No focal urinary bladder wall thickening. Stomach/Bowel: Moderate to high-grade sigmoid and mild  descending colon diverticulosis is again seen. Mild free fluid adjacent to the posteroinferior aspect of the proximal to mid sigmoid colon (axial series 1 images 86 through 91 and coronal series 9, image 60) is favored to be secondary to the postoperative ascites and not definite sigmoid diverticulitis. No definitive sigmoid colon inflammatory wall thickening. There is mild fluid around the descending colon which is new from 10/02/2021 CT, however the descending colon inflammatory wall thickening is no longer seen. The terminal ileum is unremarkable. The appendix is again not confidently identified. No definite inflammatory changes are seen around the cecum to indicate secondary signs of acute appendicitis. There is moderate fluid distention of the visualized distal esophagus, moderate to high-grade just above the gastroesophageal junction (axial image 20). There is moderate fluid throughout the patient's stomach. Note is made that patient may be at increased risk for aspiration given the distal esophageal fluid. There are air-fluid levels within the proximal to mid small bowel. Mild small bowel dilatation up to approximately 3.4 cm (axial series 1, image 70). The more distal small bowel normally tapers to the level of the terminal ileum. No evidence of bowel obstruction. Vascular/Lymphatic: No abdominal aortic aneurysm. The major intra-abdominal aortic branch vessels are patent. Mild-to-moderate atherosclerotic calcifications. No mesenteric, retroperitoneal, or pelvic pathologically enlarged lymph nodes by CT criteria. Reproductive: The prostate gland is again nodular and measures  up to 5.9 cm in transverse dimension, moderately to markedly enlarged. The seminal vesicles are grossly unremarkable. Other: Moderate left fat containing inguinal hernia. Mild ascites around the liver, within the pericolic gutters, and within the superior pelvis. Trace fluid around the spleen. Mild nondependent upper abdominal  pneumoperitoneum consistent with recent surgery. Musculoskeletal: Vertebroplasty cement is again seen within the L1 vertebral body. Moderate to severe multilevel degenerative disc changes. Old mildly displaced posterior left twelfth and eleventh rib fractures, with interval healing/fusion of the posterior left eleventh rib fracture. IMPRESSION: 1. Interval surgery, apparent recent cholecystectomy. Mild fluid and air within the gallbladder fossa and mild pneumoperitoneum, consistent with this recent surgery. 2. Moderate to high-grade sigmoid and mild descending colon diverticulosis. There is mild postoperative ascites, however otherwise no definite colonic wall inflammatory changes are seen to indicate acute colonic diverticulitis. 3. Moderate fluid within the visualized distal esophagus with moderate fluid within the stomach. Please note given this fluid at the time of the CT scan, the patient may be at increased risk for aspiration. 4. Prostatomegaly. Electronically Signed: By: Yvonne Kendall M.D. On: 02/26/2022 11:10   CT Head Wo Contrast  Result Date: 02/26/2022 CLINICAL DATA:  Neuro deficit, acute, stroke suspected EXAM: CT HEAD WITHOUT CONTRAST TECHNIQUE: Contiguous axial images were obtained from the base of the skull through the vertex without intravenous contrast. RADIATION DOSE REDUCTION: This exam was performed according to the departmental dose-optimization program which includes automated exposure control, adjustment of the mA and/or kV according to patient size and/or use of iterative reconstruction technique. COMPARISON:  Head CT 08/01/2021. FINDINGS: Brain: No evidence of acute intracranial hemorrhage or abnormal extra-axial collection.There is encephalomalacia in the right frontal and parietal lobes compatible with old infarct. Old left basal ganglia infarct.Ex vacuo dilation of the of lateral ventricles.Chronic small vessel ischemic changes.Mild cerebral atrophy Vascular: Vascular calcifications.   No hyperdense vessel. Skull: Negative for skull fracture. Sinuses/Orbits: Orbits are unremarkable. Paranasal sinuses are predominantly clear. Mastoid air cells are clear. Other: None. IMPRESSION: No acute intracranial abnormality. Chronic right MCA territory and left basal ganglia infarcts. Electronically Signed   By: Maurine Simmering M.D.   On: 02/26/2022 10:53    Anti-infectives: Anti-infectives (From admission, onward)    None        Assessment/Plan Choledocholithiasis POD#5 lap chole Dr. Thermon Leyland 9/12 -MRCP with cholelithiasis and multiple mid to distal CBD stones.  Pancreas grossly unremarkable in setting of elevated lipase -ERCP 9/11. Stones removed and sphincterotomy performed -Lap chole 9/12, discharged 9/14 -Readmit 9/15 with ileus -CT noting some air and fluid in the gallbladder fossa, significance unclear at this time but may be normal/expected postop.  - Afebrile, VSS. Labs pending this morning. Continue NG to LIWS and daily suppositories. Mobilize, PT.  Please hold plavix.     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

## 2022-02-28 NOTE — Plan of Care (Signed)
  Problem: Clinical Measurements: Goal: Respiratory complications will improve Outcome: Progressing Goal: Cardiovascular complication will be avoided Outcome: Progressing   Problem: Activity: Goal: Risk for activity intolerance will decrease Outcome: Not Progressing   Problem: Nutrition: Goal: Adequate nutrition will be maintained Outcome: Not Progressing   

## 2022-02-28 NOTE — Evaluation (Signed)
Occupational Therapy Evaluation Patient Details Name: William [name] MRN: [id number] DOB: Apr 20, 1941 Today's Date: 02/28/2022   History of Present Illness 81 yo male returns to [facility] after ERCP and lap chole with abdominal pain, emesis, presyncope and found to have ileus. NG tube placed 9/16.  PMHx:  stroke with L  hemiparesis, CKD3, HTN, vascular dementia, bladder cancer, choledocholithiasis s/p ERCP   Clinical Impression   Pt needing assist at baseline for ADLs and functional mobility. Pt fatigued at time of evaluation after standing with NSG x3, needing min-mod A for ADLs, and max A for bed mobility, pt with posterior lean, increased cues to have pt scoot forward to EOB. Pt able to sit up for a minute or two before requesting to lay back down and rest. Pt presenting with impairments listed below, will follow acutely. Noted AIR signing off, recommend SNF at d/c.      Recommendations for follow up therapy are one component of a multi-disciplinary discharge planning process, led by the attending physician.  Recommendations may be updated based on patient status, additional functional criteria and insurance authorization.   Follow Up Recommendations  Skilled nursing-short term rehab (<3 hours/day)    Assistance Recommended at Discharge Frequent or constant Supervision/Assistance  Patient can return home with the following A lot of help with walking and/or transfers;A lot of help with bathing/dressing/bathroom;Assistance with cooking/housework;Direct supervision/assist for medications management;Direct supervision/assist for financial management;Assist for transportation;Help with stairs or ramp for entrance    Functional Status Assessment  Patient has had a recent decline in their functional status and demonstrates the ability to make significant improvements in function in a reasonable and predictable amount of time.  Equipment Recommendations  None recommended by OT;Other (comment) (defer)     Recommendations for Other Services PT consult     Precautions / Restrictions Precautions Precautions: Fall Precaution Comments: L hemiparesis; NG tube Restrictions Weight Bearing Restrictions: No Other Position/Activity Restrictions: per wife has L clavicle fx, non-op      Mobility Bed Mobility Overal bed mobility: Needs Assistance Bed Mobility: Supine to Sit, Sit to Supine     Supine to sit: Max assist Sit to supine: Max assist   General bed mobility comments: max  as pt fatigued from standing x3 with NSG prior to OT arrival    Transfers                   General transfer comment: pt declining, dozing off stating he wants to "catch some zzz's"      Balance Overall balance assessment: Needs assistance Sitting-balance support: Feet supported Sitting balance-Leahy Scale: Poor   Postural control: Posterior lean                                 ADL either performed or assessed with clinical judgement   ADL   Eating/Feeding: Minimal assistance   Grooming: Minimal assistance   Upper Body Bathing: Moderate assistance   Lower Body Bathing: Moderate assistance   Upper Body Dressing : Moderate assistance   Lower Body Dressing: Moderate assistance   Toilet Transfer: Moderate assistance   Toileting- Clothing Manipulation and Hygiene: Moderate assistance               Vision   Vision Assessment?: No apparent visual deficits     Perception     Praxis      Pertinent Vitals/Pain Pain Assessment Pain Assessment: Faces Pain Score: 4  Faces  Pain Scale: Hurts little more Pain Location: nose Pain Descriptors / Indicators: Discomfort Pain Intervention(s): Limited activity within patient's tolerance, Monitored during session, Repositioned     Hand Dominance Right   Extremity/Trunk Assessment Upper Extremity Assessment Upper Extremity Assessment: Generalized weakness LUE Deficits / Details: dislocated L clavicle, proximal vs distal  weakness from prior CVA. Poor coordination L hand LUE Coordination: decreased gross motor;decreased fine motor   Lower Extremity Assessment Lower Extremity Assessment: Defer to PT evaluation   Cervical / Trunk Assessment Cervical / Trunk Assessment: Normal   Communication Communication Communication: Expressive difficulties;Receptive difficulties   Cognition Arousal/Alertness: Awake/alert Behavior During Therapy: Flat affect, Impulsive Overall Cognitive Status: History of cognitive impairments - at baseline                                 General Comments: needing step by step verbal and tactile cues to complete with increased time to perform all tasks; A &  O x4     General Comments  VSS on RA    Exercises     Shoulder Instructions      Home Living Family/patient expects to be discharged to:: Private residence Living Arrangements: Spouse/significant other Available Help at Discharge: Family Type of Home: House Home Access: Stairs to enter Technical brewer of Steps: 1 Entrance Stairs-Rails: None Home Layout: One level     Bathroom Shower/Tub: Teacher, early years/pre: Standard     Home Equipment: Cane - single point;Grab bars - tub/shower;Wheelchair - Systems analyst (2 wheels)   Additional Comments: Wife is home with pt. Before surgery pt was able to ambulate without AD with supervision. Since then he has needed HHA due to difficulty managing RW or SPC      Prior Functioning/Environment Prior Level of Function : Needs assist  Cognitive Assist : Mobility (cognitive) Mobility (Cognitive): Intermittent cues   Physical Assist : Mobility (physical) Mobility (physical): Gait;Transfers;Stairs   Mobility Comments: minimal ambulation since surgery ADLs Comments: steps into shower per wife        OT Problem List: Decreased strength;Decreased range of motion;Decreased activity tolerance;Impaired balance  (sitting and/or standing);Decreased cognition;Decreased safety awareness;Cardiopulmonary status limiting activity;Impaired UE functional use      OT Treatment/Interventions: Self-care/ADL training;Therapeutic exercise;Therapeutic activities;Cognitive remediation/compensation;Visual/perceptual remediation/compensation;Balance training;Patient/family education;Energy conservation;DME and/or AE instruction    OT Goals(Current goals can be found in the care plan section) Acute Rehab OT Goals Patient Stated Goal: to sleep OT Goal Formulation: With patient Time For Goal Achievement: 03/14/22 Potential to Achieve Goals: Good ADL Goals Pt Will Perform Grooming: with min guard assist;sitting;standing Pt Will Perform Upper Body Dressing: with min guard assist;sitting;standing Pt Will Perform Lower Body Dressing: with min guard assist;sitting/lateral leans;sit to/from stand Pt Will Transfer to Toilet: with min assist;stand pivot transfer;squat pivot transfer;bedside commode Additional ADL Goal #1: pt will complete bed mobility min A in prep for ADLs  OT Frequency: Min 2X/week    Co-evaluation              AM-PAC OT "6 Clicks" Daily Activity     Outcome Measure Help from another person eating meals?: A Little Help from another person taking care of personal grooming?: A Little Help from another person toileting, which includes using toliet, bedpan, or urinal?: A Lot Help from another person bathing (including washing, rinsing, drying)?: A Lot Help from another person to put on and taking off regular upper body clothing?: A Lot Help  from another person to put on and taking off regular lower body clothing?: A Lot 6 Click Score: 14   End of Session Nurse Communication: Mobility status  Activity Tolerance: Patient limited by fatigue Patient left: in bed;with call bell/phone within reach;with bed alarm set  OT Visit Diagnosis: Unsteadiness on feet (R26.81);Other abnormalities of gait and  mobility (R26.89);Muscle weakness (generalized) (M62.81)                Time: 9971-8209 OT Time Calculation (min): 12 min Charges:  OT General Charges $OT Visit: 1 Visit OT Evaluation $OT Eval Moderate Complexity: [address], OTD, OTR/L Acute Rehab [phone]) [phone] - [place] 02/28/2022, 3:49 PM

## 2022-02-28 NOTE — Progress Notes (Signed)
PROGRESS NOTE  William [name] FTD:[id number] DOB: [birth year]   PCP: Ann Held, DO  Patient is from: Home.  Lives with his wife.  DOA: 02/26/2022 LOS: 1  Chief complaints Chief Complaint  Patient presents with   Weakness     Brief Narrative / Interim history: 81 year old M with PMH of CVA with residual left hemiparesis and vascular dementia, CKD-4, bladder cancer, BPH, GERD, osteoarthritis and recent hospitalization from 9/8-9/14 for choledocholithiasis for which he underwent ERCP and lap chole returning with emesis and presyncope, and admitted for.  Ileus.   CT abdomen and pelvis with fluid in esophagus, stomach and postoperative changes and concern for ileus.  Surgical team consulted.  Patient was started on IV fluid and eventually needed NG tube.  General surgery following.  Subjective: Seen and examined earlier this morning.  No major events overnight of this morning.  Continues to complain abdominal pain.  Pain is diffuse.  Not able to characterize.  Asking for ice chips.[name]  His wife reports 2 small BMs yesterday.  Objective: Vitals:   02/27/22 1801 02/27/22 2132 02/28/22 0521 02/28/22 0950  BP: (!) 118/54 (!) 135/53 134/73 138/64  Pulse: 96 86 94 87  Resp: '15 16 18 18  '$ Temp: 98 F (36.7 C) 98.4 F (36.9 C) 98.5 F (36.9 C) 98.2 F (36.8 C)  TempSrc: Oral     SpO2: 91% 96% 97% 97%  Weight:      Height:        Examination:  GENERAL: No apparent distress.  Nontoxic. HEENT: MMM.  Vision and hearing grossly intact.  NGT in place. NECK: Supple.  No apparent JVD.  RESP:  No IWOB.  Fair aeration bilaterally. CVS:  RRR. Heart sounds normal.  ABD/GI/GU: BS+. Abd soft.  Mild diffuse tenderness. MSK/EXT:  Moves extremities. No apparent deformity. No edema.  SKIN: no apparent skin lesion or wound NEURO: Sleepy but wakes to voice.  Oriented to self, place and family.  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 PSYCH: Calm. Normal affect.   Procedures:  9/15-NG tube  insertion  Microbiology summarized: None  Assessment and plan: Principal Problem:   Ileus, postoperative (HCC) Active Problems:   BPH (benign prostatic hyperplasia)   History of right MCA infarct   Major vascular neurocognitive disorder   HTN (hypertension)   Hyperlipidemia   Anxiety   Normocytic anemia   Acute 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 (HCC)   Hemiplegia, dominant side S/P CVA (cerebrovascular accident) ([place])   Choledocholithiasis   DNR (do not resuscitate)   Postoperative ileus ([place])   Choledocholithiasis s/p ERCP on 9/11 and lap chole on 9/12.  Discharged on 9/14. Postoperative ileus:  Returns with emesis, presyncope and abdominal pain.  CT showed moderate fluid within distal esophagus and stomach.  KUB on 9/15 with stable to mildly progressed small bowel ileus versus SBO.  Since wife reports 2 small BMs yesterday.  Continues to endorse abdominal pain.  Mild diffuse tenderness to palpation.  About 2 L from NGT. -Surgery managing-continue NGT to LIWS, daily suppositories, mobilize, PT -N.p.o. except sips with meds and ice chips -Continue IVF at 125 cc an hour-about 3 L in 24 hours -Discontinued nonessential meds and change exam to IV -Continue holding Plavix per general surgery    AKI on CKD-3B: Baseline Cr 1.6-1.7.  AKI likely due to the above.  Seems to be improving. 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 -Holding Plavix in the setting of the above -Continue amantadine, tegretol, lorazepam,  trazodone -PT/OT  Normocytic anemia: Likely dilutional from IV fluid. Recent Labs    02/19/22 1118 02/20/22 0742 02/21/22 0142 02/22/22 0835 02/23/22 0328 02/24/22 0352 02/25/22 0536 02/26/22 0926 02/27/22 1019 02/28/22 1052  HGB 11.8* 9.9* 9.3* 10.8* 9.7* 10.3* 9.3* 11.6* 9.8* 8.2*  -Monitor H&H -Check anemia panel in the morning     Essential hypertension: Normotensive. -Crushed meds, sips only -Continue amlodipine, hydralazine, tamsulosin    Hyperlipidemia - Monitor, not currently on therapy due to intolerance    Anxiety -Continue lorazepam at home doses -They have not started hydroxyzine outpatient   BPH -Monitor UOP (I/Os) -Tamsulosin at home dose  Elevated troponin: Mild.  Likely delayed clearance  Leukocytosis/bandemia: Likely demargination versus infection.  Resolved.   Body mass index is 25.1 kg/m.          DVT prophylaxis:  Place and maintain sequential compression device Start: 02/26/22 2141  Code Status: DNR/DNI Family Communication: Updated patient's wife at bedside Level of care: Med-Surg Status is: Inpatient The patient will remain inpatient because: Persistent postoperative ileus requiring NG tube and IV fluid   Final disposition: Likely home once medically stable Consultants:  General surgery  Sch Meds:  Scheduled Meds:  amantadine  100 mg Oral Daily   bisacodyl  10 mg Rectal Daily   carBAMazepine  100 mg Oral 2 times per day   carBAMazepine  200 mg Oral Q2200   ferrous sulfate  325 mg Oral Q breakfast   LORazepam  0.5 mg Intravenous BID   mouth rinse  15 mL Mouth Rinse 4 times per day   pantoprazole (PROTONIX) IV  40 mg Intravenous Q24H   tamsulosin  0.4 mg Oral QPC supper   traZODone  75 mg Oral QHS   Continuous Infusions:  sodium chloride 125 mL/hr at 02/28/22 1248   methocarbamol (ROBAXIN) IV     PRN Meds:.acetaminophen, labetalol, methocarbamol (ROBAXIN) IV, ondansetron **OR** ondansetron (ZOFRAN) IV, mouth  rinse  Antimicrobials: Anti-infectives (From admission, onward)    None        I have personally reviewed the following labs and images: CBC: Recent Labs  Lab 02/22/22 0835 02/23/22 0328 02/24/22 0352 02/25/22 0536 02/26/22 0926 02/27/22 1019 02/28/22 1052  WBC 5.2 7.0 9.6 8.4 12.9* 11.6* 8.5  NEUTROABS 3.0 4.7 8.0* 5.5 10.5*  --   --   HGB 10.8* 9.7* 10.3* 9.3* 11.6* 9.8* 8.2*  HCT 31.6* 29.6* 31.3* 27.7* 35.7* 28.5* 24.0*  MCV 96.3 97.7 99.4 99.3 99.2 96.0 98.0  PLT 172 175 169 154 270 231 168   BMP &GFR Recent Labs  Lab 02/22/22 0835 02/23/22 0328 02/24/22 0352 02/25/22 0536 02/26/22 0926 02/27/22 1019 02/28/22 1052  NA 139 138 138 137 138 138 142  K 3.5 3.6 4.9 4.2 4.0 4.1 3.6  CL 104 105 106 104 104 107 110  CO2 '27 26 25 25 24 '$ 20* 23  GLUCOSE 109* 128* 158* 115* 175* 161* 125*  BUN '12 19 21 19 21 '$ 41* 40*  CREATININE 1.65* 2.52* 2.33* 1.96* 1.74* 2.25* 2.17*  CALCIUM 9.0 8.7* 8.5*  8.4* 8.9 8.5* 8.2*  MG 1.8 1.8 2.0  --   --   --  1.8  PHOS  --   --   --   --   --   --  3.2   Estimated Creatinine Clearance: 29.4 mL/min (A) (by C-G formula based on SCr of 2.17 mg/dL (H)). Liver & Pancreas: Recent Labs  Lab 02/24/22 0352 02/25/22 0536 02/26/22 0926 02/27/22 1019 02/28/22 1052  AST 45* '25 20 20 19  '$ ALT 83* 57* 46* 34 28  ALKPHOS 293* 224* 219* 148* 123  BILITOT 0.7 0.4 0.8 1.0 0.6  PROT 5.9* 5.7* 6.1* 5.5* 5.3*  ALBUMIN 2.8* 2.6* 2.7* 2.4* 2.3*   No results for input(s): "LIPASE", "AMYLASE" in the last 168 hours.  No results for input(s): "AMMONIA" in the last 168 hours. Diabetic: No results for input(s): "HGBA1C" in the last 72 hours. Recent Labs  Lab 02/21/22 1625 02/21/22 2117 02/27/22 2133  GLUCAP 129* 125* 143*   Cardiac Enzymes: No results for input(s): "CKTOTAL", "CKMB", "CKMBINDEX", "TROPONINI" in the last 168 hours. No results for input(s): "PROBNP" in the last 8760 hours. Coagulation Profile: No results for input(s): "INR",  "PROTIME" in the last 168 hours. Thyroid Function Tests: Recent Labs    02/27/22 1019  TSH 0.836   Lipid Profile: No results for input(s): "CHOL", "HDL", "LDLCALC", "TRIG", "CHOLHDL", "LDLDIRECT" in the last 72 hours. Anemia Panel: No results for input(s): "VITAMINB12", "FOLATE", "FERRITIN", "TIBC", "IRON", "RETICCTPCT" in the last 72 hours. Urine analysis:    Component Value Date/Time   COLORURINE YELLOW 02/27/2022 0500   APPEARANCEUR HAZY (A) 02/27/2022 0500   APPEARANCEUR Clear 08/27/2021 0740   LABSPEC >1.046 (H) 02/27/2022 0500   PHURINE 5.0 02/27/2022 0500   GLUCOSEU NEGATIVE 02/27/2022 0500   HGBUR NEGATIVE 02/27/2022 0500   HGBUR negative 09/25/2009 1535   BILIRUBINUR NEGATIVE 02/27/2022 0500   BILIRUBINUR small 11/12/2021 1146   BILIRUBINUR Negative 08/27/2021 0740   KETONESUR NEGATIVE 02/27/2022 0500   PROTEINUR 30 (A) 02/27/2022 0500   UROBILINOGEN 0.2 11/12/2021 1146   UROBILINOGEN 0.2 09/25/2009 1535   NITRITE NEGATIVE 02/27/2022 0500   LEUKOCYTESUR NEGATIVE 02/27/2022 0500   Sepsis Labs: Invalid input(s): "PROCALCITONIN", "LACTICIDVEN"  Microbiology: No results found for this or any previous visit (from the past 240 hour(s)).  Radiology Studies: No results found.    Mistey Hoffert T. [place]  If 7PM-7AM, please contact night-coverage [email or web address] 02/28/2022, 1:07 PM

## 2022-03-01 ENCOUNTER — Telehealth: Payer: Self-pay | Admitting: *Deleted

## 2022-03-01 ENCOUNTER — Ambulatory Visit: Payer: PPO | Admitting: Occupational Therapy

## 2022-03-01 ENCOUNTER — Ambulatory Visit: Payer: PPO | Admitting: Physical Therapy

## 2022-03-01 DIAGNOSIS — E876 Hypokalemia: Secondary | ICD-10-CM

## 2022-03-01 DIAGNOSIS — I63511 Cerebral infarction due to unspecified occlusion or stenosis of right middle cerebral artery: Secondary | ICD-10-CM | POA: Diagnosis not present

## 2022-03-01 DIAGNOSIS — N179 Acute kidney failure, unspecified: Secondary | ICD-10-CM | POA: Diagnosis not present

## 2022-03-01 DIAGNOSIS — F0390 Unspecified dementia without behavioral disturbance: Secondary | ICD-10-CM | POA: Diagnosis not present

## 2022-03-01 DIAGNOSIS — E870 Hyperosmolality and hypernatremia: Secondary | ICD-10-CM

## 2022-03-01 DIAGNOSIS — K9189 Other postprocedural complications and disorders of digestive system: Secondary | ICD-10-CM | POA: Diagnosis not present

## 2022-03-01 LAB — CBC
HCT: 23.9 % — ABNORMAL LOW (ref 39.0–52.0)
Hemoglobin: 8.1 g/dL — ABNORMAL LOW (ref 13.0–17.0)
MCH: 33.5 pg (ref 26.0–34.0)
MCHC: 33.9 g/dL (ref 30.0–36.0)
MCV: 98.8 fL (ref 80.0–100.0)
Platelets: 174 10*3/uL (ref 150–400)
RBC: 2.42 MIL/uL — ABNORMAL LOW (ref 4.22–5.81)
RDW: 13.3 % (ref 11.5–15.5)
WBC: 8.8 10*3/uL (ref 4.0–10.5)
nRBC: 0 % (ref 0.0–0.2)

## 2022-03-01 LAB — FERRITIN: Ferritin: 60 ng/mL (ref 24–336)

## 2022-03-01 LAB — RENAL FUNCTION PANEL
Albumin: 2.3 g/dL — ABNORMAL LOW (ref 3.5–5.0)
Anion gap: 10 (ref 5–15)
BUN: 32 mg/dL — ABNORMAL HIGH (ref 8–23)
CO2: 24 mmol/L (ref 22–32)
Calcium: 8.5 mg/dL — ABNORMAL LOW (ref 8.9–10.3)
Chloride: 113 mmol/L — ABNORMAL HIGH (ref 98–111)
Creatinine, Ser: 1.79 mg/dL — ABNORMAL HIGH (ref 0.61–1.24)
GFR, Estimated: 38 mL/min — ABNORMAL LOW (ref >60)
Glucose, Bld: 113 mg/dL — ABNORMAL HIGH (ref 70–99)
Phosphorus: 3.6 mg/dL (ref 2.5–4.6)
Potassium: 3.3 mmol/L — ABNORMAL LOW (ref 3.5–5.1)
Sodium: 147 mmol/L — ABNORMAL HIGH (ref 135–145)

## 2022-03-01 LAB — FOLATE: Folate: 7 ng/mL (ref >5.9)

## 2022-03-01 LAB — IRON AND TIBC
Iron: 13 ug/dL — ABNORMAL LOW (ref 45–182)
Saturation Ratios: 6 % — ABNORMAL LOW (ref 17.9–39.5)
TIBC: 224 ug/dL — ABNORMAL LOW (ref 250–450)
UIBC: 211 ug/dL

## 2022-03-01 LAB — RETICULOCYTES
Immature Retic Fract: 18.4 % — ABNORMAL HIGH (ref 2.3–15.9)
RBC.: 2.46 MIL/uL — ABNORMAL LOW (ref 4.22–5.81)
Retic Count, Absolute: 60.8 10*3/uL (ref 19.0–186.0)
Retic Ct Pct: 2.5 % (ref 0.4–3.1)

## 2022-03-01 LAB — VITAMIN B12: Vitamin B-12: 324 pg/mL (ref 180–914)

## 2022-03-01 LAB — MAGNESIUM: Magnesium: 1.9 mg/dL (ref 1.7–2.4)

## 2022-03-01 MED ORDER — LACTATED RINGERS IV SOLN: INTRAVENOUS | Status: DC

## 2022-03-01 MED ORDER — POTASSIUM CHLORIDE CRYS ER 20 MEQ PO TBCR
40.0000 meq | EXTENDED_RELEASE_TABLET | ORAL | Status: AC
  Administered 2022-03-01 (×2): 40 meq via ORAL
  Filled 2022-03-01 (×2): qty 2

## 2022-03-01 MED ORDER — SODIUM CHLORIDE 0.9 % IV SOLN
250.0000 mg | Freq: Every day | INTRAVENOUS | Status: AC
  Administered 2022-03-01 – 2022-03-02 (×2): 250 mg via INTRAVENOUS
  Filled 2022-03-01 (×2): qty 20

## 2022-03-01 NOTE — Patient Outreach (Signed)
  Care Coordination TOC Note Transition Care Management Unsuccessful Follow-up Telephone Call  Date of discharge and from where:  02/25/22 from [facility]  Attempts:  2nd Attempt  Reason for unsuccessful TCM follow-up call:  No answer/busy. Patient hospitalized. Ineligible for TCM due to readmission within 30 days of discharge.  Chong Sicilian, BSN, RN-BC RN Care Coordinator [place] Direct Dial: [phone] Main #: [phone]

## 2022-03-01 NOTE — Plan of Care (Signed)
  Problem: Education: Goal: Knowledge of General Education information will improve Description Including pain rating scale, medication(s)/side effects and non-pharmacologic comfort measures Outcome: Progressing   Problem: Health Behavior/Discharge Planning: Goal: Ability to manage health-related needs will improve Outcome: Progressing   

## 2022-03-01 NOTE — Progress Notes (Signed)
[facility] Progress Note     Subjective: Having multiple BMs.  C/o some pain in the RLQ where his appendix used to be but no other symptoms.  NGT has been clamped for some meds.  Mild nausea, but wife states he has some issues with this at baseline.  Objective: Vital signs in last 24 hours: Temp:  [98.1 F (36.7 C)-98.4 F (36.9 C)] 98.1 F (36.7 C) (09/18 0900) Pulse Rate:  [77-93] 77 (09/18 0900) Resp:  [16-17] 17 (09/18 0900) BP: (153-171)/(65-80) 171/68 (09/18 0900) SpO2:  [95 %-97 %] 96 % (09/18 0900) Last BM Date : 03/01/22  Intake/Output from previous day: 09/17 0701 - 09/18 0700 In: 1543.5 [I.V.:1543.5] Out: 2150 [Urine:950; Emesis/NG output:1200] Intake/Output this shift: No intake/output data recorded.  PE: Gen:  Alert, NAD, pleasant Pulm:  rate and effort normal Abd: soft, minimal distension, mild diffuse tenderness and no peritonitis, lap incisions cdi  Lab Results:  Recent Labs    02/28/22 1052 03/01/22 0803  WBC 8.5 8.8  HGB 8.2* 8.1*  HCT 24.0* 23.9*  PLT 168 174   BMET Recent Labs    02/28/22 1052 03/01/22 0803  NA 142 147*  K 3.6 3.3*  CL 110 113*  CO2 23 24  GLUCOSE 125* 113*  BUN 40* 32*  CREATININE 2.17* 1.79*  CALCIUM 8.2* 8.5*   PT/INR No results for input(s): "LABPROT", "INR" in the last 72 hours. CMP     Component Value Date/Time   NA 147 (H) 03/01/2022 0803   NA 142 04/25/2019 1000   K 3.3 (L) 03/01/2022 0803   CL 113 (H) 03/01/2022 0803   CO2 24 03/01/2022 0803   GLUCOSE 113 (H) 03/01/2022 0803   BUN 32 (H) 03/01/2022 0803   BUN 30 (H) 04/25/2019 1000   CREATININE 1.79 (H) 03/01/2022 0803   CREATININE 1.71 (H) 12/25/2021 1433   CALCIUM 8.5 (L) 03/01/2022 0803   PROT 5.3 (L) 02/28/2022 1052   PROT 7.4 04/25/2019 1000   ALBUMIN 2.3 (L) 03/01/2022 0803   ALBUMIN 4.2 04/25/2019 1000   AST 19 02/28/2022 1052   ALT 28 02/28/2022 1052   ALKPHOS 123 02/28/2022 1052   BILITOT 0.6 02/28/2022 1052   BILITOT  0.3 04/25/2019 1000   GFRNONAA 38 (L) 03/01/2022 0803   GFRAA 34 (L) 04/25/2019 1000   Lipase     Component Value Date/Time   LIPASE 62 (H) 02/21/2022 0142       Studies/Results: DG Abd Portable 1V-Small Bowel Obstruction Protocol-initial, 8 hr delay  Result Date: 02/28/2022 CLINICAL DATA:  8 hour follow-up small-bowel film EXAM: PORTABLE ABDOMEN - 1 VIEW COMPARISON:  02/27/2022 FINDINGS: Scattered large and small bowel gas is noted. Persistent small bowel dilatation is noted although improved from the prior exam. Gastric catheter is noted in satisfactory position. Majority of the administered contrast now lies in the colon consistent with a partial small bowel obstruction. IMPRESSION: Changes consistent with partial small bowel obstruction with the majority of the previously administered contrast now lying within the colon. Overall decrease in the degree of small-bowel obstruction is noted. Electronically Signed   By: Inez Catalina M.D.   On: 02/28/2022 22:59    Anti-infectives: Anti-infectives (From admission, onward)    None        Assessment/Plan Choledocholithiasis POD#6 lap chole Dr. Thermon Leyland 9/12 -MRCP with cholelithiasis and multiple mid to distal CBD stones.  Pancreas grossly unremarkable in setting of elevated lipase -ERCP 9/11. Stones removed and sphincterotomy performed -Lap chole 9/12,  discharged 9/14 -Readmit 9/15 with ileus -CT noting some air and fluid in the gallbladder fossa, significance unclear at this time but may be normal/expected postop.  - Afebrile, VSS. WBC normal as are LFTs.  Do not expect post op complication, just ileus which seems to be improving.  Contrast in his colon on his follow up film.  Minimal NGT output since yesterday.  Will DC NGT and allow CLD.  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

## 2022-03-01 NOTE — Progress Notes (Signed)
Physical Therapy Treatment Patient Details Name: William [name] MRN: [id number] DOB: 11-Feb-1941 Today's Date: 03/01/2022   History of Present Illness 81 yo male returns to [facility] after ERCP and lap chole with abdominal pain, emesis, presyncope and found to have ileus. NG tube placed 9/16.  PMHx:  stroke with L  hemiparesis, CKD3, HTN, vascular dementia, bladder cancer, choledocholithiasis s/p ERCP    PT Comments    Pt found to be incontinent of stool on entry, requiring mod A for rolling L and maxA for rolling R for complete cleaning and linen change. Pt mod Ax2 for coming to seated EoB and for standing up. Pt initiated steps towards chair with modAx2 when he became incontinent of stool again. Requires maxAx2 to return to bed and mod-maxA for rolling to clean. AIR was declined by insurance due to lack of medical necessity. PT now recommending SNF level therapy prior to return home. PT will continue to follow acutely.   Recommendations for follow up therapy are one component of a multi-disciplinary discharge planning process, led by the attending physician.  Recommendations may be updated based on patient status, additional functional criteria and insurance authorization.  Follow Up Recommendations  Skilled nursing-short term rehab (<3 hours/day) Can patient physically be transported by private vehicle: No   Assistance Recommended at Discharge Frequent or constant Supervision/Assistance  Patient can return home with the following Direct supervision/assist for medications management;Direct supervision/assist for financial management;Assist for transportation;Help with stairs or ramp for entrance;Two people to help with walking and/or transfers;Two people to help with bathing/dressing/bathroom   Equipment Recommendations  None recommended by PT       Precautions / Restrictions Precautions Precautions: Fall Precaution Comments: L hemiparesis Restrictions Weight Bearing Restrictions: No Other  Position/Activity Restrictions: per wife has L clavicle fx, non-op     Mobility  Bed Mobility Overal bed mobility: Needs Assistance   Rolling: Mod assist, Max assist   Supine to sit: Max assist, +2 for physical assistance   Sit to sidelying: Max assist, +2 for physical assistance General bed mobility comments: mod -maxA for rolling for pericare, maxAx2 with HoB elevated for sequencing coming to EoB. pt with increased posterior lean requiring maximal cuing for anterior lean. pt requiring maxAx2 for coming to sidelying for return to bed after incontinence of stool    Transfers Overall transfer level: Needs assistance Equipment used: 2 person hand held assist, Rolling walker (2 wheels) Transfers: Sit to/from Stand, Bed to chair/wheelchair/BSC Sit to Stand: Mod assist, +2 physical assistance, From elevated surface   Step pivot transfers: Mod assist, +2 physical assistance       General transfer comment: mod A +2 for power up. With step pivot, pt had difficulty wt shifting to step to R, incontinent of stool in standing requiring stepping back to bed.       Modified Rankin (Stroke Patients Only) Modified Rankin (Stroke Patients Only) Pre-Morbid Rankin Score: Moderately severe disability (hx CVA) Modified Rankin: Severe disability     Balance Overall balance assessment: Needs assistance Sitting-balance support: Feet supported Sitting balance-Leahy Scale: Poor Sitting balance - Comments: min/ mod A to maintain sitting due to posterior lean, eventually able to maintain sitting balance without outside support Postural control: Posterior lean Standing balance support: Bilateral upper extremity supported Standing balance-Leahy Scale: Poor Standing balance comment: reliant on external W and UE support  Cognition Arousal/Alertness: Awake/alert Behavior During Therapy: Flat affect, Impulsive Overall Cognitive Status: History of cognitive  impairments - at baseline                                 General Comments: needing step by step verbal and tactile cues to complete with increased time to perform all tasks           General Comments General comments (skin integrity, edema, etc.): VSS on RA, pt's wife in room, providing encouragement and education to therapist of PLOF      Pertinent Vitals/Pain Pain Assessment Pain Assessment: Faces Faces Pain Scale: Hurts little more Pain Location: periarea due to constant stooling Pain Descriptors / Indicators: Discomfort Pain Intervention(s): Limited activity within patient's tolerance, Monitored during session, Repositioned     PT Goals (current goals can now be found in the care plan section) Acute Rehab PT Goals Patient Stated Goal: none stated PT Goal Formulation: With family Time For Goal Achievement: 03/20/22 Potential to Achieve Goals: Good Progress towards PT goals: Progressing toward goals    Frequency    Min 3X/week      PT Plan Discharge plan needs to be updated       AM-PAC PT "6 Clicks" Mobility   Outcome Measure  Help needed turning from your back to your side while in a flat bed without using bedrails?: A Lot Help needed moving from lying on your back to sitting on the side of a flat bed without using bedrails?: A Lot Help needed moving to and from a bed to a chair (including a wheelchair)?: Total Help needed standing up from a chair using your arms (e.g., wheelchair or bedside chair)?: Total Help needed to walk in hospital room?: Total Help needed climbing 3-5 steps with a railing? : Total 6 Click Score: 8    End of Session Equipment Utilized During Treatment: Gait belt Activity Tolerance: Other (comment);Patient tolerated treatment well (limited by on going incontinence of stool) Patient left: in bed;with nursing/sitter in room;with family/visitor present Nurse Communication: Mobility status PT Visit Diagnosis: Unsteadiness  on feet (R26.81);Muscle weakness (generalized) (M62.81);Difficulty in walking, not elsewhere classified (R26.2);Hemiplegia and hemiparesis Hemiplegia - Right/Left: Left Hemiplegia - dominant/non-dominant: Non-dominant Hemiplegia - caused by: Nontraumatic intracerebral hemorrhage     Time: 6759-1638 PT Time Calculation (min) (ACUTE ONLY): 73 min  Charges:  $Therapeutic Activity: 68-82 mins                     William [name] B. Migdalia Dk PT, DPT Acute Rehabilitation Services Please use secure chat or  Call Office [phone]    [place] 03/01/2022, 4:29 PM

## 2022-03-01 NOTE — Progress Notes (Signed)
PROGRESS NOTE  EPIC TRIBBETT GQQ:[id number] DOB: Jul 29, 1940   PCP: Ann Held, DO  Patient is from: Home.  Lives with his wife.  DOA: 02/26/2022 LOS: 2  Chief complaints Chief Complaint  Patient presents with   Weakness     Brief Narrative / Interim history: 81 year old M with PMH of CVA with residual left hemiparesis and vascular dementia, CKD-4, bladder cancer, BPH, GERD, osteoarthritis and recent hospitalization from 9/8-9/14 for choledocholithiasis for which he underwent ERCP and lap chole returning with emesis and presyncope, and admitted for ileus.  CT abdomen and pelvis with fluid in esophagus, stomach and postoperative changes and concern for ileus.  Surgical team consulted.  Patient was started on IV fluid and eventually needed NG tube.  Ileus/partial obstruction seems to have resolved clinically and radiologically.  NG tube discontinued.  Started on CLD.  General surgery following.  Therapy recommends SNF.  Subjective: Seen and examined earlier this morning.  No major events overnight of this morning.  Reports pain "my ass hole".  Denies abdominal pain.  Had bowel movements.  X-ray with contrast in colon.  Patient's wife at bedside.  Objective: Vitals:   02/28/22 2107 03/01/22 0304 03/01/22 0503 03/01/22 0900  BP: (!) 168/80 (!) 167/73 (!) 165/68 (!) 171/68  Pulse: 93 85 82 77  Resp: '16  16 17  '$ Temp: 98.3 F (36.8 C)  98.4 F (36.9 C) 98.1 F (36.7 C)  TempSrc:    Oral  SpO2: 95% 97% 96% 96%  Weight:      Height:        Examination:  GENERAL: No apparent distress.  Nontoxic. HEENT: MMM.  Vision and hearing grossly intact.  NECK: Supple.  No apparent JVD.  RESP:  No IWOB.  Fair aeration bilaterally. CVS:  RRR. Heart sounds normal.  ABD/GI/GU: BS+. Abd soft.  Mild tenderness. MSK/EXT:  Moves extremities. No apparent deformity. No edema.  SKIN: no apparent skin lesion or wound NEURO: Awake and alert. Oriented appropriately.  Chronic left  hemiparesis, 3/5. PSYCH: Calm. Normal affect.   Procedures:  9/15-NG tube insertion  Microbiology summarized: None  Assessment and plan: Principal Problem:   Ileus, postoperative (HCC) Active Problems:   BPH (benign prostatic hyperplasia)   History of right MCA infarct   Major vascular neurocognitive disorder   HTN (hypertension)   Hyperlipidemia   Anxiety   Normocytic anemia   Acute 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 (HCC)   Hemiplegia, dominant side S/P CVA (cerebrovascular accident) ([place])   Choledocholithiasis   DNR (do not resuscitate)   Postoperative ileus ([place])   Choledocholithiasis s/p ERCP on 9/11 and lap chole on 9/12.  Discharged on 9/14. Postoperative ileus:  Returns with emesis, presyncope and abdominal pain.  CT showed moderate fluid within distal esophagus and stomach.  Ileus/partial SBO seems to have resolved clinically and radiologically. -Surgery managing-NGT discontinued.  Started CLD. -Change IV NS to LR in the setting of hypernatremia. -Resume home p.o. meds.  Surgery recommends holding Plavix today    AKI on CKD-3B: Baseline Cr 1.6-1.7.  AKI likely due to the above.  Seems to have resolved. Recent Labs    02/20/22 0742 02/21/22 0142 02/22/22 0835 02/23/22 0328 02/24/22 0352 02/25/22 0536 02/26/22 0926 02/27/22 1019 02/28/22 1052 03/01/22 0803  BUN 26* '18 12 19 21 19 21 '$ 41* 40* 32*  CREATININE 1.64* 1.67* 1.65* 2.52* 2.33* 1.96* 1.74* 2.25* 2.17* 1.79*  -Change NS to LR -Avoid nephrotoxic meds -Continue monitoring -Strict intake  and output  Hypernatremia and hypokalemia: Na 147.  K3.3. -Change NS to LR for hypernatremia -P.o. 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 -Holding Plavix in the setting of the above -Continue amantadine, tegretol, lorazepam, trazodone -PT/OT  Normocytic iron deficiency anemia: Anemia panel consistent with  iron deficiency.  Likely dilutional from IV fluid. Recent Labs    02/20/22 0742 02/21/22 0142 02/22/22 0835 02/23/22 0328 02/24/22 0352 02/25/22 0536 02/26/22 0926 02/27/22 1019 02/28/22 1052 03/01/22 0803  HGB 9.9* 9.3* 10.8* 9.7* 10.3* 9.3* 11.6* 9.8* 8.2* 8.1*  -Monitor H&H -IV ferric gluconate 250 mg daily x2 doses     Essential hypertension: Normotensive. -Continue amlodipine, hydralazine, tamsulosin    Hyperlipidemia - Monitor, not currently on therapy due to intolerance    Anxiety -Continue lorazepam at home doses -They have not started hydroxyzine outpatient   BPH -Monitor UOP (I/Os) -Tamsulosin at home dose  Elevated troponin: Mild.  Likely delayed clearance  Leukocytosis/bandemia: Likely demargination versus infection.  Resolved.   Body mass index is 25.1 kg/m.          DVT prophylaxis:  Place and maintain sequential compression device Start: 02/26/22 2141  Code Status: DNR/DNI Family Communication: Updated patient's wife at bedside Level of care: Med-Surg Status is: Inpatient The patient will remain inpatient because: Persistent postoperative ileus requiring NG tube and IV fluid   Final disposition: Likely home once medically stable Consultants:  General surgery  Sch Meds:  Scheduled Meds:  amantadine  100 mg Oral Daily   bisacodyl  10 mg Rectal Daily   carBAMazepine  100 mg Oral 2 times per day   carBAMazepine  200 mg Oral Q2200   ferrous sulfate  325 mg Oral Q breakfast   LORazepam  0.5 mg Intravenous BID   mouth rinse  15 mL Mouth Rinse 4 times per day   pantoprazole (PROTONIX) IV  40 mg Intravenous Q24H   potassium chloride  40 mEq Oral Q4H   tamsulosin  0.4 mg Oral QPC supper   traZODone  75 mg Oral QHS   Continuous Infusions:  ferric gluconate (FERRLECIT) IVPB     lactated ringers     methocarbamol (ROBAXIN) IV     PRN Meds:.acetaminophen, labetalol, methocarbamol (ROBAXIN) IV, ondansetron **OR** ondansetron (ZOFRAN) IV,  mouth rinse  Antimicrobials: Anti-infectives (From admission, onward)    None        I have personally reviewed the following labs and images: CBC: Recent Labs  Lab 02/23/22 0328 02/24/22 0352 02/25/22 0536 02/26/22 0926 02/27/22 1019 02/28/22 1052 03/01/22 0803  WBC 7.0 9.6 8.4 12.9* 11.6* 8.5 8.8  NEUTROABS 4.7 8.0* 5.5 10.5*  --   --   --   HGB 9.7* 10.3* 9.3* 11.6* 9.8* 8.2* 8.1*  HCT 29.6* 31.3* 27.7* 35.7* 28.5* 24.0* 23.9*  MCV 97.7 99.4 99.3 99.2 96.0 98.0 98.8  PLT 175 169 154 270 231 168 174   BMP &GFR Recent Labs  Lab 02/23/22 0328 02/24/22 0352 02/25/22 0536 02/26/22 0926 02/27/22 1019 02/28/22 1052 03/01/22 0803  NA 138 138 137 138 138 142 147*  K 3.6 4.9 4.2 4.0 4.1 3.6 3.3*  CL 105 106 104 104 107 110 113*  CO2 '26 25 25 24 '$ 20* 23 24  GLUCOSE 128* 158* 115* 175* 161* 125* 113*  BUN '19 21 19 21 '$ 41* 40* 32*  CREATININE 2.52* 2.33* 1.96* 1.74* 2.25* 2.17* 1.79*  CALCIUM 8.7* 8.5* 8.4* 8.9 8.5* 8.2*  8.5*  MG 1.8 2.0  --   --   --  1.8 1.9  PHOS  --   --   --   --   --  3.2 3.6   Estimated Creatinine Clearance: 35.6 mL/min (A) (by C-G formula based on SCr of 1.79 mg/dL (H)). Liver & Pancreas: Recent Labs  Lab 02/24/22 0352 02/25/22 0536 02/26/22 0926 02/27/22 1019 02/28/22 1052 03/01/22 0803  AST 45* '25 20 20 19  '$ --   ALT 83* 57* 46* 34 28  --   ALKPHOS 293* 224* 219* 148* 123  --   BILITOT 0.7 0.4 0.8 1.0 0.6  --   PROT 5.9* 5.7* 6.1* 5.5* 5.3*  --   ALBUMIN 2.8* 2.6* 2.7* 2.4* 2.3* 2.3*   No results for input(s): "LIPASE", "AMYLASE" in the last 168 hours.  No results for input(s): "AMMONIA" in the last 168 hours. Diabetic: No results for input(s): "HGBA1C" in the last 72 hours. Recent Labs  Lab 02/27/22 2133  GLUCAP 143*   Cardiac Enzymes: No results for input(s): "CKTOTAL", "CKMB", "CKMBINDEX", "TROPONINI" in the last 168 hours. No results for input(s): "PROBNP" in the last 8760 hours. Coagulation Profile: No results for  input(s): "INR", "PROTIME" in the last 168 hours. Thyroid Function Tests: Recent Labs    02/27/22 1019  TSH 0.836   Lipid Profile: No results for input(s): "CHOL", "HDL", "LDLCALC", "TRIG", "CHOLHDL", "LDLDIRECT" in the last 72 hours. Anemia Panel: Recent Labs    03/01/22 0803  VITAMINB12 324  FOLATE 7.0  FERRITIN 60  TIBC 224*  IRON 13*  RETICCTPCT 2.5   Urine analysis:    Component Value Date/Time   COLORURINE YELLOW 02/27/2022 0500   APPEARANCEUR HAZY (A) 02/27/2022 0500   APPEARANCEUR Clear 08/27/2021 0740   LABSPEC >1.046 (H) 02/27/2022 0500   PHURINE 5.0 02/27/2022 0500   GLUCOSEU NEGATIVE 02/27/2022 0500   HGBUR NEGATIVE 02/27/2022 0500   HGBUR negative 09/25/2009 1535   BILIRUBINUR NEGATIVE 02/27/2022 0500   BILIRUBINUR small 11/12/2021 1146   BILIRUBINUR Negative 08/27/2021 0740   KETONESUR NEGATIVE 02/27/2022 0500   PROTEINUR 30 (A) 02/27/2022 0500   UROBILINOGEN 0.2 11/12/2021 1146   UROBILINOGEN 0.2 09/25/2009 1535   NITRITE NEGATIVE 02/27/2022 0500   LEUKOCYTESUR NEGATIVE 02/27/2022 0500   Sepsis Labs: Invalid input(s): "PROCALCITONIN", "LACTICIDVEN"  Microbiology: No results found for this or any previous visit (from the past 240 hour(s)).  Radiology Studies: DG Abd Portable 1V-Small Bowel Obstruction Protocol-initial, 8 hr delay  Result Date: 02/28/2022 CLINICAL DATA:  8 hour follow-up small-bowel film EXAM: PORTABLE ABDOMEN - 1 VIEW COMPARISON:  02/27/2022 FINDINGS: Scattered large and small bowel gas is noted. Persistent small bowel dilatation is noted although improved from the prior exam. Gastric catheter is noted in satisfactory position. Majority of the administered contrast now lies in the colon consistent with a partial small bowel obstruction. IMPRESSION: Changes consistent with partial small bowel obstruction with the majority of the previously administered contrast now lying within the colon. Overall decrease in the degree of small-bowel  obstruction is noted. Electronically Signed   By: Inez Catalina M.D.   On: 02/28/2022 22:59      Kashia Brossard T. [place]  If 7PM-7AM, please contact night-coverage [email or web address] 03/01/2022, 12:19 PM

## 2022-03-02 ENCOUNTER — Inpatient Hospital Stay (HOSPITAL_COMMUNITY): Payer: PPO

## 2022-03-02 ENCOUNTER — Telehealth: Payer: Self-pay | Admitting: *Deleted

## 2022-03-02 DIAGNOSIS — I63511 Cerebral infarction due to unspecified occlusion or stenosis of right middle cerebral artery: Secondary | ICD-10-CM | POA: Diagnosis not present

## 2022-03-02 DIAGNOSIS — R5381 Other malaise: Secondary | ICD-10-CM

## 2022-03-02 DIAGNOSIS — N179 Acute kidney failure, unspecified: Secondary | ICD-10-CM | POA: Diagnosis not present

## 2022-03-02 DIAGNOSIS — K9189 Other postprocedural complications and disorders of digestive system: Secondary | ICD-10-CM | POA: Diagnosis not present

## 2022-03-02 DIAGNOSIS — F0390 Unspecified dementia without behavioral disturbance: Secondary | ICD-10-CM | POA: Diagnosis not present

## 2022-03-02 LAB — COMPREHENSIVE METABOLIC PANEL
ALT: 43 U/L (ref 0–44)
AST: 32 U/L (ref 15–41)
Albumin: 2.1 g/dL — ABNORMAL LOW (ref 3.5–5.0)
Alkaline Phosphatase: 157 U/L — ABNORMAL HIGH (ref 38–126)
Anion gap: 7 (ref 5–15)
BUN: 21 mg/dL (ref 8–23)
CO2: 23 mmol/L (ref 22–32)
Calcium: 8.3 mg/dL — ABNORMAL LOW (ref 8.9–10.3)
Chloride: 115 mmol/L — ABNORMAL HIGH (ref 98–111)
Creatinine, Ser: 1.52 mg/dL — ABNORMAL HIGH (ref 0.61–1.24)
GFR, Estimated: 46 mL/min — ABNORMAL LOW (ref >60)
Glucose, Bld: 128 mg/dL — ABNORMAL HIGH (ref 70–99)
Potassium: 3.7 mmol/L (ref 3.5–5.1)
Sodium: 145 mmol/L (ref 135–145)
Total Bilirubin: 0.2 mg/dL — ABNORMAL LOW (ref 0.3–1.2)
Total Protein: 5.2 g/dL — ABNORMAL LOW (ref 6.5–8.1)

## 2022-03-02 LAB — CBC
HCT: 23 % — ABNORMAL LOW (ref 39.0–52.0)
Hemoglobin: 7.8 g/dL — ABNORMAL LOW (ref 13.0–17.0)
MCH: 33.2 pg (ref 26.0–34.0)
MCHC: 33.9 g/dL (ref 30.0–36.0)
MCV: 97.9 fL (ref 80.0–100.0)
Platelets: 178 10*3/uL (ref 150–400)
RBC: 2.35 MIL/uL — ABNORMAL LOW (ref 4.22–5.81)
RDW: 13.5 % (ref 11.5–15.5)
WBC: 9.2 10*3/uL (ref 4.0–10.5)
nRBC: 0 % (ref 0.0–0.2)

## 2022-03-02 LAB — MAGNESIUM: Magnesium: 1.8 mg/dL (ref 1.7–2.4)

## 2022-03-02 LAB — PHOSPHORUS: Phosphorus: 2.9 mg/dL (ref 2.5–4.6)

## 2022-03-02 MED ORDER — PANTOPRAZOLE SODIUM 40 MG PO TBEC: 40.0000 mg | DELAYED_RELEASE_TABLET | ORAL | Status: DC

## 2022-03-02 MED ORDER — CLOPIDOGREL BISULFATE 75 MG PO TABS
75.0000 mg | ORAL_TABLET | Freq: Every day | ORAL | Status: DC
  Administered 2022-03-03 – 2022-03-04 (×2): 75 mg via ORAL
  Filled 2022-03-02 (×3): qty 1

## 2022-03-02 NOTE — TOC Initial Note (Signed)
Transition of Care [facility]) - Initial/Assessment Note    Patient Details  Name: William [name] MRN: [id number] Date of Birth: 06-28-1940  Transition of [facility] CM/SW Contact:    Tom-Johnson, Renea Ee, RN Phone Number: 03/02/2022, 3:16 PM  Clinical Narrative:                  CM spoke with patient and wife, William [name] at bedside about needs for post hospital transition. Admitted for Post Operative Ileus. Recently admitted for Choledocholithiasis,  had ERCP and Lap choley on 02/23/22. From home with wife. SNF recommended by PT/OT, wife requesting home health. Patient is alert to self at this time and wife makes all decisions.  Patient has all necessary DME's at home. Home health referral was called in to [facility] at previous admission. CM called [facility] and spoke with Caryl Pina to resume care. Caryl Pina voiced acceptance, info on AVS. William [name] requests for Custodial services at discharge. States patient has benefit with [employer]. CM called and spoke with Lilia Pro [phone]). Lilia Pro states MD to put in order and send referral to company patient wants to use for prior authorization. Prior Authorization form will be sent when there is a discharge date. CM will continue to follow as patient progresses towards discharge.    Expected Discharge Plan: [place] Barriers to Discharge: Continued Medical Work up   Patient Goals and CMS Choice Patient states their goals for this hospitalization and ongoing recovery are:: To return home CMS Medicare.gov Compare Post Acute Care list provided to:: Patient Choice offered to / list presented to : Patient, Spouse  Expected Discharge Plan and Services Expected Discharge Plan: [place]   Discharge Planning Services: CM Consult Post Acute Care Choice: [place] arrangements for the past 2 months: Single Family Home                 DME Arranged: N/A DME Agency: NA       HH Arranged: PT, OT (Custodial  Services) [place] Agency: [place] ([place]) Date [place]: 03/02/22 Time [place]: 3818 Representative spoke with at [place]: [place] Arrangements/Services Living arrangements for the past 2 months: [place] with:: Spouse Patient language and need for interpreter reviewed:: Yes Do you feel safe going back to the place where you live?: Yes      Need for Family Participation in Patient Care: Yes (Comment) Care giver support system in place?: Yes (comment)   Criminal Activity/Legal Involvement Pertinent to Current Situation/Hospitalization: No - Comment as needed  Activities of Daily Living Home Assistive Devices/Equipment: None ADL Screening (condition at time of admission) Patient's cognitive ability adequate to safely complete daily activities?: No Is the patient deaf or have difficulty hearing?: No Does the patient have difficulty seeing, even when wearing glasses/contacts?: No Does the patient have difficulty concentrating, remembering, or making decisions?: Yes Patient able to express need for assistance with ADLs?: Yes Does the patient have difficulty dressing or bathing?: Yes Independently performs ADLs?: No Does the patient have difficulty walking or climbing stairs?: Yes Weakness of Legs: Both Weakness of Arms/Hands: Left  Permission Sought/Granted Permission sought to share information with : Case Manager, Customer service manager, Family Supports Permission granted to share information with : Yes, Verbal Permission Granted              Emotional Assessment Appearance:: Appears stated age Attitude/Demeanor/Rapport: Gracious Michela Pitcher thank you.) Affect (typically observed): Accepting Orientation: : Oriented  to Self Alcohol / Substance Use: Not Applicable Psych Involvement: No (comment)  Admission diagnosis:  Ileus, postoperative ([place]) [K91.89, K56.7] Ileus following gastrointestinal surgery ([place])  [K91.89, K56.7] Near syncope [R55] Postoperative ileus ([place]) [K91.89, K56.7] Patient Active Problem List   Diagnosis Date Noted   Postoperative ileus ([place]) 02/27/2022   Ileus, postoperative ([place]) 02/26/2022   Choledocholithiasis 02/19/2022   DNR (do not resuscitate) 02/19/2022   Left elbow pain 01/26/2022   Mid back pain on left side 01/26/2022   Rib pain 01/26/2022   Acute pain of left shoulder 11/12/2021   Leukocytes in urine 11/12/2021   Urinary frequency 11/12/2021   Thrush 10/08/2021   Hemiplegia, dominant side S/P CVA (cerebrovascular accident) ([place]) 09/11/2021   Insomnia    Prediabetes    Acute 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 ([place])    Basal ganglia infarction ([place]) 07/29/2021   Transaminitis 07/27/2021   UTI (urinary tract infection) 07/27/2021   CVA (cerebral vascular accident) ([place]) 07/27/2021   Fall 07/27/2021   Hyperglycemia 07/27/2021   Cholelithiasis 07/27/2021   Hypoxia 07/27/2021   Nausea and vomiting 14/78/2956   Acute metabolic encephalopathy 21/30/8657   Normocytic anemia 07/27/2021   Chronic back pain 07/27/2021   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[place]) 06/22/2021   Closed fracture of first lumbar vertebra with routine healing 02/03/2021   Closed fracture of multiple ribs 11/18/2020   Anxiety 01/29/2020   Leg pain, bilateral 01/29/2020   Ingrown toenail 07/13/2019   Lumbar spondylosis 05/02/2018   Pain in left knee 03/09/2018   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   Trochanteric bursitis of left hip 01/16/2018   Preventative health care 09/26/2017   HTN (hypertension) 07/19/2015   Hyperlipidemia 07/19/2015   Great toe pain 02/11/2014   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   Obesity (BMI 30-39.9) 06/25/2013   Renal insufficiency 06/25/2013   Weakness of left arm 06/25/2013   Sebaceous cyst 03/03/2011   Sprain of lumbar region 07/31/2010   Rib pain, left 08/29/2009   Carotid artery  stenosis, asymptomatic, bilateral 05/02/2009   Eczema, atopic 05/31/2008   Vitamin D deficiency 03/01/2008   BPH (benign prostatic hyperplasia) 08/06/2007   Fasting hyperglycemia 12/21/2006   History of right MCA infarct 2006   PCP:  Ann Held, DO Pharmacy:   [facility] [address] [place] [address] Phone: [phone] Fax: [facility] [address], [place] [address] Phone: [phone] Fax: [phone]  [facility] [address] [name] [address] Phone: [phone] Fax: [phone]  [id number] [place] [address]: [phone]: [phone] [id number] [address] [place]: [phone]: [phone]   Social Determinants of Health (SDOH) Interventions    Readmission Risk Interventions    02/23/2022    1:27 PM  Readmission Risk Prevention Plan  Transportation Screening Complete  PCP or Specialist Appt within 3-5 Days Complete  HRI or [place]plete  Social Work Consult for Re[place]ornbecklanning/Counseling Complete  Palliative Care Screening Complete  Medication Review (RPress photographerComplete

## 2022-03-02 NOTE — Progress Notes (Signed)
   Subjective/Chief Complaint: Tolerating clear liquids without nausea or vomiting Multiple MB's   Objective: Vital signs in last 24 hours: Temp:  [98.1 F (36.7 C)-98.6 F (37 C)] 98.6 F (37 C) (09/19 0545) Pulse Rate:  [70-77] 75 (09/19 0545) Resp:  [16-18] 18 (09/19 0545) BP: (152-180)/(63-73) 152/63 (09/19 0545) SpO2:  [93 %-97 %] 93 % (09/19 0545) Last BM Date : 03/01/22  Intake/Output from previous day: 09/18 0701 - 09/19 0700 In: 2669.3 [P.O.:660; I.V.:1739.3; IV Piggyback:270] Out: 1000 [Urine:1000] Intake/Output this shift: No intake/output data recorded.  Exam: Awake and alert Abdomen soft, non-distended, non-tender  Lab Results:  Recent Labs    02/28/22 1052 03/01/22 0803  WBC 8.5 8.8  HGB 8.2* 8.1*  HCT 24.0* 23.9*  PLT 168 174   BMET Recent Labs    02/28/22 1052 03/01/22 0803  NA 142 147*  K 3.6 3.3*  CL 110 113*  CO2 23 24  GLUCOSE 125* 113*  BUN 40* 32*  CREATININE 2.17* 1.79*  CALCIUM 8.2* 8.5*   PT/INR No results for input(s): "LABPROT", "INR" in the last 72 hours. ABG No results for input(s): "PHART", "HCO3" in the last 72 hours.  Invalid input(s): "PCO2", "PO2"  Studies/Results: DG Abd Portable 1V-Small Bowel Obstruction Protocol-initial, 8 hr delay  Result Date: 02/28/2022 CLINICAL DATA:  8 hour follow-up small-bowel film EXAM: PORTABLE ABDOMEN - 1 VIEW COMPARISON:  02/27/2022 FINDINGS: Scattered large and small bowel gas is noted. Persistent small bowel dilatation is noted although improved from the prior exam. Gastric catheter is noted in satisfactory position. Majority of the administered contrast now lies in the colon consistent with a partial small bowel obstruction. IMPRESSION: Changes consistent with partial small bowel obstruction with the majority of the previously administered contrast now lying within the colon. Overall decrease in the degree of small-bowel obstruction is noted. Electronically Signed   By: Inez Catalina  M.D.   On: 02/28/2022 22:59    Anti-infectives: Anti-infectives (From admission, onward)    None       Assessment/Plan: Post op ileus with readmission CVA w/ left sided hemiplegia (2006, 07/2021) - on plavix HTN HLD Carotid artery stenosis s/p b/l CEA CKD stage IV Remote hx of bladder cancer s/p TURP  GERD   Improving.  Will advance to full liquids Discussed with the patient and his wife Needs more PT Ok to resume plavix   Coralie Keens MD 03/02/2022

## 2022-03-02 NOTE — Progress Notes (Signed)
PROGRESS NOTE  FAVOR HACKLER MWU:[id number] DOB: 04/19/1941   PCP: Ann Held, DO  Patient is from: Home.  Lives with his wife.  DOA: 02/26/2022 LOS: 3  Chief complaints Chief Complaint  Patient presents with   Weakness     Brief Narrative / Interim history: 81 year old M with PMH of CVA with residual left hemiparesis and vascular dementia, CKD-4, bladder cancer, BPH, GERD, osteoarthritis and recent hospitalization from 9/8-9/14 for choledocholithiasis for which he underwent ERCP and lap chole returning with emesis and presyncope, and admitted for ileus.  CT abdomen and pelvis with fluid in esophagus, stomach and postoperative changes and concern for ileus.  Surgical team consulted.  Patient was started on IV fluid and eventually needed NG tube.  Ileus/partial obstruction seems to have resolved clinically and radiologically.  NG tube discontinued.  Advanced to FLD.  General surgery following.  Therapy recommends SNF.  Subjective: Seen and examined earlier this morning.  No major events overnight of this morning.  No complaints but not a great historian.  He is only oriented to self, person and place.  Denies pain.  Tolerated CLD.  Advanced to FLD by surgery.  Objective: Vitals:   03/01/22 2111 03/02/22 0545 03/02/22 0918 03/02/22 1442  BP: (!) 166/65 (!) 152/63 (!) 150/61 (!) 192/81  Pulse: 70 75 80   Resp: '16 18 17   '$ Temp: 98.1 F (36.7 C) 98.6 F (37 C) 98.3 F (36.8 C) 98.7 F (37.1 C)  TempSrc: Oral Oral Oral   SpO2: 95% 93% 98%   Weight:      Height:        Examination:  GENERAL: No apparent distress.  Nontoxic. HEENT: MMM.  Vision and hearing grossly intact.  NECK: Supple.  No apparent JVD.  RESP:  No IWOB.  Fair aeration bilaterally. CVS:  RRR. Heart sounds normal.  ABD/GI/GU: BS+. Abd soft, NTND.  MSK/EXT:  Moves extremities. No apparent deformity. No edema.  SKIN: no apparent skin lesion or wound NEURO: Awake and alert. Oriented to self, person  and place.  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 PSYCH: Appears anxious.  Procedures:  9/15-NG tube insertion  Microbiology summarized: None  Assessment and plan: Principal Problem:   Ileus, postoperative (HCC) Active Problems:   BPH (benign prostatic hyperplasia)   History of right MCA infarct   Major vascular neurocognitive disorder   HTN (hypertension)   Hyperlipidemia   Anxiety   Normocytic anemia   Acute 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 (HCC)   Hemiplegia, dominant side S/P CVA (cerebrovascular accident) ([place])   Choledocholithiasis   DNR (do not resuscitate)   Postoperative ileus ([place])   Choledocholithiasis s/p ERCP on 9/11 and lap chole on 9/12.  Discharged on 9/14. Postoperative ileus: presents with emesis, presyncope and abdominal pain.  CT showed moderate fluid within distal esophagus and stomach.  Ileus/partial SBO seems to have resolved clinically and radiologically. -Surgery managing-NGT discontinued.  Tolerated CLD.  Advanced to FLD. -Continue LR at 100 cc an hour -Resumed Plavix after discussion with surgery.    AKI on CKD-3B: Baseline Cr 1.6-1.7.  AKI likely due to the above.  Resolved. Recent Labs    02/21/22 0142 02/22/22 0835 02/23/22 0328 02/24/22 0352 02/25/22 0536 02/26/22 0926 02/27/22 1019 02/28/22 1052 03/01/22 0803 03/02/22 0855  BUN '18 12 19 21 19 21 '$ 41* 40* 32* 21  CREATININE 1.67* 1.65* 2.52* 2.33* 1.96* 1.74* 2.25* 2.17* 1.79* 1.52*  -Continue IV LR  Hypernatremia and hypokalemia: Resolved. -Monitor  replenish as appropriate  History of right MCA infarct with residual left hemiparesis and cognitive impairment: Stable. -Continue amantadine, tegretol, lorazepam, trazodone -Resumed Plavix after discussion with surgery -PT/OT  Normocytic iron deficiency anemia: Anemia panel consistent with iron deficiency.  Drop in Hgb partly dilutional. Recent Labs    02/21/22 0142 02/22/22 0835 02/23/22 0328 02/24/22 0352  02/25/22 0536 02/26/22 0926 02/27/22 1019 02/28/22 1052 03/01/22 0803 03/02/22 0855  HGB 9.3* 10.8* 9.7* 10.3* 9.3* 11.6* 9.8* 8.2* 8.1* 7.8*  -Monitor H&H -IV ferric gluconate 250 mg daily x2 doses     Essential hypertension: Normotensive. -Continue amlodipine, hydralazine, tamsulosin    Hyperlipidemia - Monitor, not currently on therapy due to intolerance    Anxiety state -Continue lorazepam at home doses -Has not started hydroxyzine outpatient   BPH -Monitor UOP (I/Os) -Tamsulosin at home dose  Elevated troponin: Mild.  Likely delayed clearance  Leukocytosis/bandemia: Likely demargination versus infection.  Resolved.  Physical deconditioning: -Therapy recommended SNF but wife is inclined to take him home with home meds.  Body mass index is 25.1 kg/m.          DVT prophylaxis:  Place and maintain sequential compression device Start: 02/26/22 2141  Code Status: DNR/DNI Family Communication: Updated patient's wife at bedside Level of care: Med-Surg Status is: Inpatient The patient will remain inpatient because: Persistent postoperative ileus requiring NG tube and IV fluid   Final disposition: Likely home once medically stable Consultants:  General surgery  Sch Meds:  Scheduled Meds:  amantadine  100 mg Oral Daily   bisacodyl  10 mg Rectal Daily   carBAMazepine  100 mg Oral 2 times per day   carBAMazepine  200 mg Oral Q2200   clopidogrel  75 mg Oral Daily   ferrous sulfate  325 mg Oral Q breakfast   LORazepam  0.5 mg Intravenous BID   mouth rinse  15 mL Mouth Rinse 4 times per day   pantoprazole  40 mg Oral Q24H   tamsulosin  0.4 mg Oral QPC supper   traZODone  75 mg Oral QHS   Continuous Infusions:  lactated ringers 100 mL/hr at 03/02/22 0837   methocarbamol (ROBAXIN) IV     PRN Meds:.acetaminophen, labetalol, methocarbamol (ROBAXIN) IV, ondansetron **OR** ondansetron (ZOFRAN) IV, mouth rinse  Antimicrobials: Anti-infectives (From admission,  onward)    None        I have personally reviewed the following labs and images: CBC: Recent Labs  Lab 02/24/22 0352 02/25/22 0536 02/26/22 0926 02/27/22 1019 02/28/22 1052 03/01/22 0803 03/02/22 0855  WBC 9.6 8.4 12.9* 11.6* 8.5 8.8 9.2  NEUTROABS 8.0* 5.5 10.5*  --   --   --   --   HGB 10.3* 9.3* 11.6* 9.8* 8.2* 8.1* 7.8*  HCT 31.3* 27.7* 35.7* 28.5* 24.0* 23.9* 23.0*  MCV 99.4 99.3 99.2 96.0 98.0 98.8 97.9  PLT 169 154 270 231 168 174 178   BMP &GFR Recent Labs  Lab 02/24/22 0352 02/25/22 0536 02/26/22 0926 02/27/22 1019 02/28/22 1052 03/01/22 0803 03/02/22 0855  NA 138   < > 138 138 142 147* 145  K 4.9   < > 4.0 4.1 3.6 3.3* 3.7  CL 106   < > 104 107 110 113* 115*  CO2 25   < > 24 20* '23 24 23  '$ GLUCOSE 158*   < > 175* 161* 125* 113* 128*  BUN 21   < > 21 41* 40* 32* 21  CREATININE 2.33*   < > 1.74* 2.25*  2.17* 1.79* 1.52*  CALCIUM 8.5*   < > 8.9 8.5* 8.2* 8.5* 8.3*  MG 2.0  --   --   --  1.8 1.9 1.8  PHOS  --   --   --   --  3.2 3.6 2.9   < > = values in this interval not displayed.   Estimated Creatinine Clearance: 42 mL/min (A) (by C-G formula based on SCr of 1.52 mg/dL (H)). Liver & Pancreas: Recent Labs  Lab 02/25/22 0536 02/26/22 0926 02/27/22 1019 02/28/22 1052 03/01/22 0803 03/02/22 0855  AST '25 20 20 19  '$ --  32  ALT 57* 46* 34 28  --  43  ALKPHOS 224* 219* 148* 123  --  157*  BILITOT 0.4 0.8 1.0 0.6  --  0.2*  PROT 5.7* 6.1* 5.5* 5.3*  --  5.2*  ALBUMIN 2.6* 2.7* 2.4* 2.3* 2.3* 2.1*   No results for input(s): "LIPASE", "AMYLASE" in the last 168 hours.  No results for input(s): "AMMONIA" in the last 168 hours. Diabetic: No results for input(s): "HGBA1C" in the last 72 hours. Recent Labs  Lab 02/27/22 2133  GLUCAP 143*   Cardiac Enzymes: No results for input(s): "CKTOTAL", "CKMB", "CKMBINDEX", "TROPONINI" in the last 168 hours. No results for input(s): "PROBNP" in the last 8760 hours. Coagulation Profile: No results for  input(s): "INR", "PROTIME" in the last 168 hours. Thyroid Function Tests: No results for input(s): "TSH", "T4TOTAL", "FREET4", "T3FREE", "THYROIDAB" in the last 72 hours.  Lipid Profile: No results for input(s): "CHOL", "HDL", "LDLCALC", "TRIG", "CHOLHDL", "LDLDIRECT" in the last 72 hours. Anemia Panel: Recent Labs    03/01/22 0803  VITAMINB12 324  FOLATE 7.0  FERRITIN 60  TIBC 224*  IRON 13*  RETICCTPCT 2.5   Urine analysis:    Component Value Date/Time   COLORURINE YELLOW 02/27/2022 0500   APPEARANCEUR HAZY (A) 02/27/2022 0500   APPEARANCEUR Clear 08/27/2021 0740   LABSPEC >1.046 (H) 02/27/2022 0500   PHURINE 5.0 02/27/2022 0500   GLUCOSEU NEGATIVE 02/27/2022 0500   HGBUR NEGATIVE 02/27/2022 0500   HGBUR negative 09/25/2009 1535   BILIRUBINUR NEGATIVE 02/27/2022 0500   BILIRUBINUR small 11/12/2021 1146   BILIRUBINUR Negative 08/27/2021 0740   KETONESUR NEGATIVE 02/27/2022 0500   PROTEINUR 30 (A) 02/27/2022 0500   UROBILINOGEN 0.2 11/12/2021 1146   UROBILINOGEN 0.2 09/25/2009 1535   NITRITE NEGATIVE 02/27/2022 0500   LEUKOCYTESUR NEGATIVE 02/27/2022 0500   Sepsis Labs: Invalid input(s): "PROCALCITONIN", "LACTICIDVEN"  Microbiology: No results found for this or any previous visit (from the past 240 hour(s)).  Radiology Studies: No results found.    Mccartney Brucks T. [place]  If 7PM-7AM, please contact night-coverage [email or web address] 03/02/2022, 2:47 PM

## 2022-03-02 NOTE — Patient Outreach (Signed)
  Care Coordination TOC Note Transition Care Management Unsuccessful Follow-up Telephone Call  Date of discharge and from where:  02/25/22 at [facility]   Attempts:  3rd Attempt  Reason for unsuccessful TCM follow-up call:  No answer/busy. Currently inpatient at [facility].  Chong Sicilian, BSN, RN-BC RN Care Coordinator [place] Direct Dial: [phone] Main #: [phone]

## 2022-03-03 ENCOUNTER — Ambulatory Visit: Payer: PPO | Admitting: Physical Therapy

## 2022-03-03 DIAGNOSIS — N1832 Chronic kidney disease, stage 3b: Secondary | ICD-10-CM | POA: Diagnosis not present

## 2022-03-03 DIAGNOSIS — N171 Acute kidney failure with acute cortical necrosis: Secondary | ICD-10-CM | POA: Diagnosis not present

## 2022-03-03 DIAGNOSIS — K567 Ileus, unspecified: Secondary | ICD-10-CM | POA: Diagnosis not present

## 2022-03-03 DIAGNOSIS — K9189 Other postprocedural complications and disorders of digestive system: Secondary | ICD-10-CM | POA: Diagnosis not present

## 2022-03-03 LAB — RENAL FUNCTION PANEL
Albumin: 2.1 g/dL — ABNORMAL LOW (ref 3.5–5.0)
Anion gap: 10 (ref 5–15)
BUN: 18 mg/dL (ref 8–23)
CO2: 22 mmol/L (ref 22–32)
Calcium: 8.2 mg/dL — ABNORMAL LOW (ref 8.9–10.3)
Chloride: 109 mmol/L (ref 98–111)
Creatinine, Ser: 1.55 mg/dL — ABNORMAL HIGH (ref 0.61–1.24)
GFR, Estimated: 45 mL/min — ABNORMAL LOW (ref >60)
Glucose, Bld: 137 mg/dL — ABNORMAL HIGH (ref 70–99)
Phosphorus: 3.4 mg/dL (ref 2.5–4.6)
Potassium: 3.5 mmol/L (ref 3.5–5.1)
Sodium: 141 mmol/L (ref 135–145)

## 2022-03-03 LAB — MAGNESIUM: Magnesium: 1.6 mg/dL — ABNORMAL LOW (ref 1.7–2.4)

## 2022-03-03 LAB — CBC
HCT: 24.5 % — ABNORMAL LOW (ref 39.0–52.0)
Hemoglobin: 8.1 g/dL — ABNORMAL LOW (ref 13.0–17.0)
MCH: 32.5 pg (ref 26.0–34.0)
MCHC: 33.1 g/dL (ref 30.0–36.0)
MCV: 98.4 fL (ref 80.0–100.0)
Platelets: 230 10*3/uL (ref 150–400)
RBC: 2.49 MIL/uL — ABNORMAL LOW (ref 4.22–5.81)
RDW: 13.3 % (ref 11.5–15.5)
WBC: 15.9 10*3/uL — ABNORMAL HIGH (ref 4.0–10.5)
nRBC: 0 % (ref 0.0–0.2)

## 2022-03-03 MED ORDER — METOCLOPRAMIDE HCL 5 MG/ML IJ SOLN
5.0000 mg | Freq: Three times a day (TID) | INTRAMUSCULAR | Status: AC
  Administered 2022-03-03 – 2022-03-05 (×6): 5 mg via INTRAVENOUS
  Filled 2022-03-03 (×6): qty 2

## 2022-03-03 MED ORDER — PANTOPRAZOLE SODIUM 40 MG IV SOLR
40.0000 mg | INTRAVENOUS | Status: DC
  Administered 2022-03-03 – 2022-03-04 (×2): 40 mg via INTRAVENOUS
  Filled 2022-03-03 (×2): qty 10

## 2022-03-03 MED ORDER — POTASSIUM CHLORIDE 10 MEQ/100ML IV SOLN
10.0000 meq | INTRAVENOUS | Status: AC
  Administered 2022-03-03 (×4): 10 meq via INTRAVENOUS
  Filled 2022-03-03 (×4): qty 100

## 2022-03-03 MED ORDER — MAGNESIUM SULFATE 4 GM/100ML IV SOLN
4.0000 g | Freq: Once | INTRAVENOUS | Status: AC
  Administered 2022-03-03: 4 g via INTRAVENOUS
  Filled 2022-03-03: qty 100

## 2022-03-03 NOTE — Progress Notes (Signed)
Occupational Therapy Treatment Patient Details Name: William [name] MRN: [id number] DOB: 04/19/1941 Today's Date: 03/03/2022   History of present illness 81 yo male returns to [facility] after ERCP and lap chole with abdominal pain, emesis, presyncope and found to have ileus. NG tube placed 9/16.  PMHx:  stroke with L  hemiparesis, CKD3, HTN, vascular dementia, bladder cancer, choledocholithiasis s/p ERCP   OT comments  Patient continues to make incremental progress towards goals in skilled OT session. Patient's session encompassed multiple bouts of rolling (mod-max A, +2 at end due to fatigue) sitting EOB and working on sitting balance (mod A with posterior lean after 5-10 seconds if no external assist), and one attempt at sit<>stand with side stepping. Session limited due to frequent stools requiring peri-care x3. Patient able to stand with mod A of 2 and heavy assist to advance R leg for side stepping, however plan deferred due to stooling uncontrollably on floor. OT continues to recommend SNF level placement due to current level of care needed and increased time to meet rehab goals. OT will continue to follow.     Recommendations for follow up therapy are one component of a multi-disciplinary discharge planning process, led by the attending physician.  Recommendations may be updated based on patient status, additional functional criteria and insurance authorization.    Follow Up Recommendations  Skilled nursing-short term rehab (<3 hours/day)    Assistance Recommended at Discharge Frequent or constant Supervision/Assistance  Patient can return home with the following  A lot of help with walking and/or transfers;A lot of help with bathing/dressing/bathroom;Assistance with cooking/housework;Direct supervision/assist for medications management;Direct supervision/assist for financial management;Assist for transportation;Help with stairs or ramp for entrance   Equipment Recommendations  None recommended  by OT;Other (comment) (patient has DME needed)    Recommendations for Other Services      Precautions / Restrictions Precautions Precautions: Fall Precaution Comments: L hemiparesis Restrictions Weight Bearing Restrictions: No Other Position/Activity Restrictions: per wife has L clavicle fx, non-op       Mobility Bed Mobility Overal bed mobility: Needs Assistance Bed Mobility: Rolling, Supine to Sit, Sit to Supine Rolling: Mod assist, Max assist Sidelying to sit: +2 for physical assistance, +2 for safety/equipment, HOB elevated, Max assist     Sit to sidelying: Max assist, +2 for physical assistance General bed mobility comments: mod -maxA for rolling for pericare, maxAx2 with HoB elevated for sequencing coming to EoB. pt with increased posterior lean requiring maximal cuing for anterior lean. pt requiring maxAx2 for coming to sidelying for return to bed after incontinence of stool    Transfers Overall transfer level: Needs assistance Equipment used: 2 person hand held assist Transfers: Sit to/from Stand Sit to Stand: Mod assist, +2 physical assistance, From elevated surface     Step pivot transfers: Max assist, +2 physical assistance     General transfer comment: mod A +2 for power up. With step pivot, pt had difficulty wt shifting to step to R, incontinent of stool in standing requiring stepping back to bed.     Balance Overall balance assessment: Needs assistance Sitting-balance support: Feet supported Sitting balance-Leahy Scale: Poor Sitting balance - Comments: min/ mod A to maintain sitting due to posterior lean, eventually able to maintain sitting balance without outside support Postural control: Posterior lean Standing balance support: Bilateral upper extremity supported Standing balance-Leahy Scale: Poor  ADL either performed or assessed with clinical judgement   ADL Overall ADL's : Needs  assistance/impaired Eating/Feeding: Minimal assistance Eating/Feeding Details (indicate cue type and reason): ice chips only due to NPO Grooming: Minimal assistance;Sitting Grooming Details (indicate cue type and reason): washing face, however could not maintain sitting balance while completing     Lower Body Bathing: Total assistance;+2 for physical assistance;+2 for safety/equipment Lower Body Bathing Details (indicate cue type and reason): rolling to complete peri care due to multiple stool episodes Upper Body Dressing : Moderate assistance;Bed level       Toilet Transfer: +2 for safety/equipment;+2 for physical assistance;Total assistance Toilet Transfer Details (indicate cue type and reason): bed level peri care x3 due to frequent stooling Toileting- Clothing Manipulation and Hygiene: Bed level;Total assistance;+2 for safety/equipment;+2 for physical assistance       Functional mobility during ADLs: Moderate assistance;+2 for physical assistance;+2 for safety/equipment General ADL Comments: Patient session limited due to frequent stools, able to compelte 1 sit<>stand with mod A of 2 prior to more stool occurring    Extremity/Trunk Assessment              Vision       Perception     Praxis      Cognition Arousal/Alertness: Awake/alert Behavior During Therapy: Flat affect, Impulsive Overall Cognitive Status: History of cognitive impairments - at baseline                                 General Comments: benefitting from praise and step by step directions        Exercises      Shoulder Instructions       General Comments      Pertinent Vitals/ Pain       Pain Assessment Pain Assessment: Faces Faces Pain Scale: Hurts whole lot Pain Location: periarea due to constant stooling Pain Descriptors / Indicators: Discomfort Pain Intervention(s): Limited activity within patient's tolerance, Monitored during session, Repositioned  Home Living                                           Prior Functioning/Environment              Frequency  Min 2X/week        Progress Toward Goals  OT Goals(current goals can now be found in the care plan section)  Progress towards OT goals: Progressing toward goals  Acute Rehab OT Goals Patient Stated Goal: to feel better OT Goal Formulation: With patient Time For Goal Achievement: 03/14/22 Potential to Achieve Goals: Fair  Plan Frequency remains appropriate;Discharge plan needs to be updated    Co-evaluation                 AM-PAC OT "6 Clicks" Daily Activity     Outcome Measure   Help from another person eating meals?: A Little Help from another person taking care of personal grooming?: A Little Help from another person toileting, which includes using toliet, bedpan, or urinal?: Total Help from another person bathing (including washing, rinsing, drying)?: A Lot Help from another person to put on and taking off regular upper body clothing?: A Lot Help from another person to put on and taking off regular lower body clothing?: Total 6 Click Score: 12    End of Session Equipment Utilized During  Treatment: Gait belt  OT Visit Diagnosis: Unsteadiness on feet (R26.81);Other abnormalities of gait and mobility (R26.89);Muscle weakness (generalized) (M62.81)   Activity Tolerance Patient limited by fatigue   Patient Left in bed;with call bell/phone within reach;with nursing/sitter in room;with family/visitor present   Nurse Communication Mobility status        Time: 2080-2233 OT Time Calculation (min): 72 min  Charges: OT General Charges $OT Visit: 1 Visit OT Treatments $Self Care/Home Management : 38-52 mins  Corinne Ports E. Ary Rudnick, OTR/L Acute Rehabilitation Services [phone]   Ascencion Dike 03/03/2022, 4:09 PM

## 2022-03-03 NOTE — Progress Notes (Signed)
[facility] Progress Note     Subjective: Had a BM yesterday, but started having vomiting yesterday as well and an NGT replaced overnight.  Objective: Vital signs in last 24 hours: Temp:  [98.5 F (36.9 C)-101.1 F (38.4 C)] 98.5 F (36.9 C) (09/20 0939) Pulse Rate:  [78-104] 78 (09/20 0939) Resp:  [16-20] 20 (09/20 0939) BP: (139-192)/(66-105) 155/69 (09/20 0939) SpO2:  [92 %-95 %] 95 % (09/20 0939) Last BM Date : 03/01/22  Intake/Output from previous day: 09/19 0701 - 09/20 0700 In: 240 [P.O.:240] Out: 2901 [Urine:2300; Emesis/NG output:600; Stool:1] Intake/Output this shift: Total I/O In: -  Out: 700 [Emesis/NG output:700]  PE: Gen:  Alert, NAD, pleasant Abd: soft, minimal distension, mild diffuse tenderness and no peritonitis, lap incisions cdi.  NGT in place and pulled back slightly.  Some bilious output noted.  Lab Results:  Recent Labs    03/02/22 0855 03/03/22 0539  WBC 9.2 15.9*  HGB 7.8* 8.1*  HCT 23.0* 24.5*  PLT 178 230   BMET Recent Labs    03/02/22 0855 03/03/22 0539  NA 145 141  K 3.7 3.5  CL 115* 109  CO2 23 22  GLUCOSE 128* 137*  BUN 21 18  CREATININE 1.52* 1.55*  CALCIUM 8.3* 8.2*   PT/INR No results for input(s): "LABPROT", "INR" in the last 72 hours. CMP     Component Value Date/Time   NA 141 03/03/2022 0539   NA 142 04/25/2019 1000   K 3.5 03/03/2022 0539   CL 109 03/03/2022 0539   CO2 22 03/03/2022 0539   GLUCOSE 137 (H) 03/03/2022 0539   BUN 18 03/03/2022 0539   BUN 30 (H) 04/25/2019 1000   CREATININE 1.55 (H) 03/03/2022 0539   CREATININE 1.71 (H) 12/25/2021 1433   CALCIUM 8.2 (L) 03/03/2022 0539   PROT 5.2 (L) 03/02/2022 0855   PROT 7.4 04/25/2019 1000   ALBUMIN 2.1 (L) 03/03/2022 0539   ALBUMIN 4.2 04/25/2019 1000   AST 32 03/02/2022 0855   ALT 43 03/02/2022 0855   ALKPHOS 157 (H) 03/02/2022 0855   BILITOT 0.2 (L) 03/02/2022 0855   BILITOT 0.3 04/25/2019 1000   GFRNONAA 45 (L) 03/03/2022 0539    GFRAA 34 (L) 04/25/2019 1000   Lipase     Component Value Date/Time   LIPASE 62 (H) 02/21/2022 0142       Studies/Results: DG Abd Portable 1V  Result Date: 03/02/2022 CLINICAL DATA:  Check gastric catheter placement EXAM: PORTABLE ABDOMEN - 1 VIEW COMPARISON:  07/31/2021 FINDINGS: Gastric catheter is noted deep within the stomach. Scattered large and small bowel gas is noted. Mild persistent small bowel dilatation is seen. Previously administered contrast lies within the colon. IMPRESSION: Gastric catheter deep in the stomach. Persistent mild partial small bowel obstruction. Electronically Signed   By: Inez Catalina M.D.   On: 03/02/2022 20:40    Anti-infectives: Anti-infectives (From admission, onward)    None        Assessment/Plan Choledocholithiasis POD#8 lap chole Dr. Thermon Leyland 9/12 -MRCP with cholelithiasis and multiple mid to distal CBD stones.  Pancreas grossly unremarkable in setting of elevated lipase -ERCP 9/11. Stones removed and sphincterotomy performed -Lap chole 9/12, discharged 9/14 -Readmit 9/15 with ileus -NGT reinserted overnight due to vomiting again. -remain NPO x ice -long discussion with wife at bedside -mobilize -temp of 101 overnight and WBC increased, abdominal exam benign.  ? Aspiration from vomiting events.  D/w primary.  Will monitor these things.  FEN - NPO/NGT/IVFs VTE -  ok for chemical prophylaxis ID - none currently needed     Per primary: CVA w/ left sided hemiplegia (2006, 07/2021) - on plavix HTN HLD Carotid artery stenosis s/p b/l CEA CKD stage IV Remote hx of bladder cancer s/p TURP  GERD     LOS: 4 days    [name], [facility] Surgery 03/03/2022, 10:03 AM Please see Amion for pager number during day hours 7:00am-4:30pm

## 2022-03-03 NOTE — Care Management Important Message (Signed)
Important Message  Patient Details  Name: NICKIE DEREN MRN: [id number] Date of Birth: 14-Dec-1940   Medicare Important Message Given:  Yes     Abdurrahman, [place] 03/03/2022, 2:49 PM

## 2022-03-03 NOTE — Progress Notes (Signed)
MD order to to reinsert NG tube sec to vomiting.16 french NG tube inserted into left nare per policy,placement confirmed by [email or web address] to low intermittent wall suction,pt tolerated well, 200cc of green drainage out .

## 2022-03-03 NOTE — Progress Notes (Signed)
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   CT abdomen and pelvis with fluid in esophagus, stomach and postoperative changes and concern for ileus.  Surgical team consulted.  Patient was started on IV fluid and eventually needed NG tube.   Ileus/partial obstruction seems to have resolved clinically and radiologically.  NG tube discontinued. Overnight on 9/19, pt again became symtomatic and required replacement of NG.  Assessment and Plan: Choledocholithiasis s/p ERCP on 9/11 and lap chole on 9/12.  Discharged on 9/14. Postoperative ileus: presents with emesis, presyncope and abdominal pain.  CT showed moderate fluid within distal esophagus and stomach.  Ileus/partial SBO seemed improved radiologically. -Surgery managing-NGT initially discontinued. -Continue LR at 100 cc an hour -NG replaced overnight. Discussed with General Surgery. Plan to cont NG for now with conservative mgt    AKI on CKD-3B: Baseline Cr 1.6-1.7.  AKI likely due to the above.  Resolved. -Continue IV LR -recheck bmet in AM   Hypernatremia . 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 -Continue amantadine, tegretol, lorazepam, trazodone -Resume Plavix when able to -PT/OT   Normocytic iron deficiency anemia: Anemia panel consistent with iron deficiency.  Drop in Hgb partly dilutional. -IV ferric gluconate 250 mg daily x2 doses -recheck cbc in AM      Essential hypertension: Normotensive. -Continue amlodipine, hydralazine,  tamsulosin as able to     Hyperlipidemia - Monitor, not currently on therapy due to intolerance    Anxiety state -Continue lorazepam at home doses -Has not started hydroxyzine outpatient   BPH -Monitor UOP (I/Os) -Tamsulosin at home dose   Elevated troponin: Mild.  Likely delayed clearance   Leukocytosis/bandemia: Likely demargination versus infection.  Resolved.   Physical deconditioning: -Therapy recommended SNF, however plans for possible d/c home when stable  Hypomagnesemia -Replaced  Hypokalemia -Replaced   Body mass index is 25.1 kg/m.   DVT prophylaxis:  Place and maintain sequential compression device Start: 02/26/22 2141   Code Status: DNR/DNI Family Communication: Updated patient's wife at bedside Level of care: Med-Surg Status is: Inpatient The patient will remain inpatient because: Persistent postoperative ileus requiring NG tube and IV fluid         Subjective: Reports feeing better since NG placement  Physical Exam: Vitals:   03/02/22 2216 03/03/22 0140 03/03/22 0434 03/03/22 0939  BP: (!) 180/86 (!) 158/77 (!) 160/66 (!) 155/69  Pulse: 97 87 83 78  Resp: '20 16 16 20  '$ Temp: (!) 101.1 F (38.4 C) 98.9 F (37.2 C) 99.2 F (37.3 C) 98.5 F (36.9 C)  TempSrc: Oral  Oral   SpO2: 94% 92% 93% 95%  Weight:      Height:       General exam: Awake, laying in bed, in nad Respiratory system: Normal respiratory effort, no wheezing Cardiovascular system: regular rate, s1, s2 Gastrointestinal system: Soft, decreased BS Central nervous system: CN2-12 grossly intact, strength intact Extremities: Perfused, no clubbing Skin: Normal skin turgor, no notable skin lesions seen Psychiatry: Mood normal // no visual hallucinations    Data Reviewed:  Labs reviewed:  K 3.5, Mg 1.6, Cr 1.55  Family Communication: Pt in room, family at bedside  Disposition: Status is: Inpatient Remains inpatient appropriate because: Severity of illness  Planned Discharge  Destination: Home    Author: Marylu Lund, MD 03/03/2022 1:09 PM  For on call review [email or web address].

## 2022-03-03 NOTE — Progress Notes (Signed)
Physical Therapy Treatment Patient Details Name: William [name] MRN: [id number] DOB: 03/06/1941 Today's Date: 03/03/2022   History of Present Illness 81 yo male returns to [facility] after ERCP and lap chole with abdominal pain, emesis, presyncope and found to have ileus. NG tube placed 9/16.  PMHx:  stroke with L  hemiparesis, CKD3, HTN, vascular dementia, bladder cancer, choledocholithiasis s/p ERCP    PT Comments    Pt may be slightly stronger and more aware today however progression of mobility severely hampered by ongoing incontinence of stool particularly with upright seated and standing positions. Pt requires maxAx2 for bed mobility, modAx2 for coming to standing and maxAx2 for stepping towards HoB. D/c plans remain appropriate. PT will continue to follow acutely.   Recommendations for follow up therapy are one component of a multi-disciplinary discharge planning process, led by the attending physician.  Recommendations may be updated based on patient status, additional functional criteria and insurance authorization.  Follow Up Recommendations  Skilled nursing-short term rehab (<3 hours/day) Can patient physically be transported by private vehicle: No   Assistance Recommended at Discharge Frequent or constant Supervision/Assistance  Patient can return home with the following Direct supervision/assist for medications management;Direct supervision/assist for financial management;Assist for transportation;Help with stairs or ramp for entrance;Two people to help with walking and/or transfers;Two people to help with bathing/dressing/bathroom   Equipment Recommendations  None recommended by PT       Precautions / Restrictions Precautions Precautions: Fall Precaution Comments: L hemiparesis Restrictions Weight Bearing Restrictions: No Other Position/Activity Restrictions: per wife has L clavicle fx, non-op     Mobility  Bed Mobility Overal bed mobility: Needs Assistance Bed Mobility:  Rolling, Supine to Sit, Sit to Supine Rolling: Mod assist, Max assist Sidelying to sit: +2 for physical assistance, +2 for safety/equipment, HOB elevated, Max assist     Sit to sidelying: Max assist, +2 for physical assistance General bed mobility comments: mod -maxA for rolling for pericare, maxAx2 with HoB elevated for sequencing coming to EoB. pt with increased posterior lean requiring maximal cuing for anterior lean. pt requiring maxAx2 for coming to sidelying for return to bed after incontinence of stool    Transfers Overall transfer level: Needs assistance Equipment used: 2 person hand held assist Transfers: Sit to/from Stand Sit to Stand: Mod assist, +2 physical assistance, From elevated surface   Step pivot transfers: Max assist, +2 physical assistance       General transfer comment: mod A +2 for power up. With step pivot, pt had difficulty wt shifting to step to R, incontinent of stool in standing requiring stepping back to bed.               Balance Overall balance assessment: Needs assistance Sitting-balance support: Feet supported Sitting balance-Leahy Scale: Poor Sitting balance - Comments: min/ mod A to maintain sitting due to posterior lean, eventually able to maintain sitting balance without outside support Postural control: Posterior lean Standing balance support: Bilateral upper extremity supported Standing balance-Leahy Scale: Poor                              Cognition Arousal/Alertness: Awake/alert Behavior During Therapy: Flat affect, Impulsive Overall Cognitive Status: History of cognitive impairments - at baseline                                 General Comments: benefitting from praise and step  by step directions           General Comments General comments (skin integrity, edema, etc.): VSS on RA, wife present end of session      Pertinent Vitals/Pain Pain Assessment Pain Assessment: Faces Faces Pain Scale:  Hurts whole lot Pain Location: periarea due to constant stooling Pain Descriptors / Indicators: Discomfort Pain Intervention(s): Limited activity within patient's tolerance, Monitored during session, Repositioned     PT Goals (current goals can now be found in the care plan section) Acute Rehab PT Goals PT Goal Formulation: With family Time For Goal Achievement: 03/20/22 Potential to Achieve Goals: Good Progress towards PT goals: Not progressing toward goals - comment (unable due to constant incontinence of stool)    Frequency    Min 3X/week      PT Plan Current plan remains appropriate       AM-PAC PT "6 Clicks" Mobility   Outcome Measure  Help needed turning from your back to your side while in a flat bed without using bedrails?: A Lot Help needed moving from lying on your back to sitting on the side of a flat bed without using bedrails?: A Lot Help needed moving to and from a bed to a chair (including a wheelchair)?: Total Help needed standing up from a chair using your arms (e.g., wheelchair or bedside chair)?: Total Help needed to walk in hospital room?: Total Help needed climbing 3-5 steps with a railing? : Total 6 Click Score: 8    End of Session Equipment Utilized During Treatment: Gait belt Activity Tolerance: Other (comment);Patient tolerated treatment well (limited by constant incontinence of stool especial with upright and standing) Patient left: in bed;with nursing/sitter in room;with family/visitor present Nurse Communication: Mobility status PT Visit Diagnosis: Unsteadiness on feet (R26.81);Muscle weakness (generalized) (M62.81);Difficulty in walking, not elsewhere classified (R26.2);Hemiplegia and hemiparesis Hemiplegia - Right/Left: Left Hemiplegia - dominant/non-dominant: Non-dominant Hemiplegia - caused by: Nontraumatic intracerebral hemorrhage     Time: 1439-1551 PT Time Calculation (min) (ACUTE ONLY): 72 min  Charges:  $Therapeutic Activity:  23-37 mins                     Kanya Potteiger B. Migdalia Dk PT, DPT Acute Rehabilitation Services Please use secure chat or  Call Office [phone]    [place] 03/03/2022, 5:00 PM

## 2022-03-03 NOTE — Plan of Care (Signed)

## 2022-03-03 NOTE — Hospital Course (Signed)
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   CT abdomen and pelvis with fluid in esophagus, stomach and postoperative changes and concern for ileus.  Surgical team consulted.  Patient was started on IV fluid and eventually needed NG tube.   Ileus/partial obstruction seems to have resolved clinically and radiologically.  NG tube discontinued. Overnight on 9/19, pt again became symtomatic and required replacement of NG.

## 2022-03-04 ENCOUNTER — Ambulatory Visit: Payer: PPO | Admitting: Physical Therapy

## 2022-03-04 ENCOUNTER — Inpatient Hospital Stay (HOSPITAL_COMMUNITY): Payer: PPO

## 2022-03-04 DIAGNOSIS — K567 Ileus, unspecified: Secondary | ICD-10-CM | POA: Diagnosis not present

## 2022-03-04 DIAGNOSIS — F419 Anxiety disorder, unspecified: Secondary | ICD-10-CM | POA: Diagnosis not present

## 2022-03-04 DIAGNOSIS — K9189 Other postprocedural complications and disorders of digestive system: Secondary | ICD-10-CM | POA: Diagnosis not present

## 2022-03-04 DIAGNOSIS — K8050 Calculus of bile duct without cholangitis or cholecystitis without obstruction: Secondary | ICD-10-CM | POA: Diagnosis not present

## 2022-03-04 LAB — COMPREHENSIVE METABOLIC PANEL
ALT: 33 U/L (ref 0–44)
AST: 22 U/L (ref 15–41)
Albumin: 2 g/dL — ABNORMAL LOW (ref 3.5–5.0)
Alkaline Phosphatase: 136 U/L — ABNORMAL HIGH (ref 38–126)
Anion gap: 8 (ref 5–15)
BUN: 19 mg/dL (ref 8–23)
CO2: 24 mmol/L (ref 22–32)
Calcium: 8.3 mg/dL — ABNORMAL LOW (ref 8.9–10.3)
Chloride: 110 mmol/L (ref 98–111)
Creatinine, Ser: 1.61 mg/dL — ABNORMAL HIGH (ref 0.61–1.24)
GFR, Estimated: 43 mL/min — ABNORMAL LOW (ref >60)
Glucose, Bld: 109 mg/dL — ABNORMAL HIGH (ref 70–99)
Potassium: 3.6 mmol/L (ref 3.5–5.1)
Sodium: 142 mmol/L (ref 135–145)
Total Bilirubin: 0.6 mg/dL (ref 0.3–1.2)
Total Protein: 5.1 g/dL — ABNORMAL LOW (ref 6.5–8.1)

## 2022-03-04 LAB — CBC
HCT: 23.4 % — ABNORMAL LOW (ref 39.0–52.0)
Hemoglobin: 7.9 g/dL — ABNORMAL LOW (ref 13.0–17.0)
MCH: 33.1 pg (ref 26.0–34.0)
MCHC: 33.8 g/dL (ref 30.0–36.0)
MCV: 97.9 fL (ref 80.0–100.0)
Platelets: 199 10*3/uL (ref 150–400)
RBC: 2.39 MIL/uL — ABNORMAL LOW (ref 4.22–5.81)
RDW: 13.2 % (ref 11.5–15.5)
WBC: 13.6 10*3/uL — ABNORMAL HIGH (ref 4.0–10.5)
nRBC: 0 % (ref 0.0–0.2)

## 2022-03-04 LAB — GLUCOSE, CAPILLARY
Glucose-Capillary: 101 mg/dL — ABNORMAL HIGH (ref 70–99)
Glucose-Capillary: 107 mg/dL — ABNORMAL HIGH (ref 70–99)

## 2022-03-04 LAB — MAGNESIUM: Magnesium: 2.1 mg/dL (ref 1.7–2.4)

## 2022-03-04 MED ORDER — FERROUS SULFATE 300 (60 FE) MG/5ML PO SYRP
300.0000 mg | ORAL_SOLUTION | Freq: Every day | ORAL | Status: DC
  Administered 2022-03-05 – 2022-03-07 (×3): 300 mg
  Filled 2022-03-04 (×3): qty 5

## 2022-03-04 MED ORDER — PANTOPRAZOLE 2 MG/ML SUSPENSION
40.0000 mg | Freq: Every day | ORAL | Status: DC
  Administered 2022-03-05 – 2022-03-07 (×3): 40 mg
  Filled 2022-03-04 (×3): qty 20

## 2022-03-04 MED ORDER — AMANTADINE HCL 50 MG/5ML PO SOLN
100.0000 mg | Freq: Every day | ORAL | Status: DC
  Administered 2022-03-05 – 2022-03-07 (×3): 100 mg
  Filled 2022-03-04 (×3): qty 10

## 2022-03-04 MED ORDER — CARBAMAZEPINE 100 MG/5ML PO SUSP
100.0000 mg | ORAL | Status: DC
  Administered 2022-03-05 – 2022-03-07 (×6): 100 mg
  Filled 2022-03-04 (×6): qty 5

## 2022-03-04 MED ORDER — CLOPIDOGREL BISULFATE 75 MG PO TABS
75.0000 mg | ORAL_TABLET | Freq: Every day | ORAL | Status: DC
  Administered 2022-03-05 – 2022-03-07 (×3): 75 mg
  Filled 2022-03-04 (×3): qty 1

## 2022-03-04 MED ORDER — ENOXAPARIN SODIUM 40 MG/0.4ML IJ SOSY
40.0000 mg | PREFILLED_SYRINGE | INTRAMUSCULAR | Status: DC
  Administered 2022-03-04 – 2022-03-12 (×9): 40 mg via SUBCUTANEOUS
  Filled 2022-03-04 (×9): qty 0.4

## 2022-03-04 MED ORDER — CARBAMAZEPINE 100 MG/5ML PO SUSP
200.0000 mg | Freq: Every day | ORAL | Status: DC
  Administered 2022-03-04 – 2022-03-06 (×3): 200 mg
  Filled 2022-03-04 (×4): qty 10

## 2022-03-04 MED ORDER — TRAZODONE HCL 50 MG PO TABS
75.0000 mg | ORAL_TABLET | Freq: Every day | ORAL | Status: DC
  Administered 2022-03-04 – 2022-03-06 (×3): 75 mg
  Filled 2022-03-04 (×2): qty 2

## 2022-03-04 MED ORDER — POTASSIUM CHLORIDE 10 MEQ/100ML IV SOLN
10.0000 meq | INTRAVENOUS | Status: AC
  Administered 2022-03-04 (×4): 10 meq via INTRAVENOUS
  Filled 2022-03-04 (×4): qty 100

## 2022-03-04 NOTE — Progress Notes (Signed)
[facility] Progress Note     Subjective: NGT with about 1200cc overnight.  No BM documented yesterday, but he states he is passing flatus.  AF in last 24 hours.    Objective: Vital signs in last 24 hours: Temp:  [98.3 F (36.8 C)-99.4 F (37.4 C)] 98.3 F (36.8 C) (09/21 0935) Pulse Rate:  [72-86] 80 (09/21 0935) Resp:  [15-16] 16 (09/21 0935) BP: (135-162)/(59-76) 162/76 (09/21 0935) SpO2:  [94 %-97 %] 97 % (09/21 0935) Last BM Date : 03/03/22  Intake/Output from previous day: 09/20 0701 - 09/21 0700 In: 4255.7 [P.O.:180; I.V.:3942.7; IV Piggyback:133] Out: 4400 [Urine:3000; Emesis/NG output:1400] Intake/Output this shift: Total I/O In: 335 [I.V.:335] Out: 450 [Urine:300; Emesis/NG output:150]  PE: Gen:  Alert, NAD, pleasant Abd: soft, very doughy today, nondistended, doesn't seem tender to palpation, lap incisions cdi.  NGT in place and minimal output currently.  Lab Results:  Recent Labs    03/03/22 0539 03/04/22 0231  WBC 15.9* 13.6*  HGB 8.1* 7.9*  HCT 24.5* 23.4*  PLT 230 199   BMET Recent Labs    03/03/22 0539 03/04/22 0231  NA 141 142  K 3.5 3.6  CL 109 110  CO2 22 24  GLUCOSE 137* 109*  BUN 18 19  CREATININE 1.55* 1.61*  CALCIUM 8.2* 8.3*   PT/INR No results for input(s): "LABPROT", "INR" in the last 72 hours. CMP     Component Value Date/Time   NA 142 03/04/2022 0231   NA 142 04/25/2019 1000   K 3.6 03/04/2022 0231   CL 110 03/04/2022 0231   CO2 24 03/04/2022 0231   GLUCOSE 109 (H) 03/04/2022 0231   BUN 19 03/04/2022 0231   BUN 30 (H) 04/25/2019 1000   CREATININE 1.61 (H) 03/04/2022 0231   CREATININE 1.71 (H) 12/25/2021 1433   CALCIUM 8.3 (L) 03/04/2022 0231   PROT 5.1 (L) 03/04/2022 0231   PROT 7.4 04/25/2019 1000   ALBUMIN 2.0 (L) 03/04/2022 0231   ALBUMIN 4.2 04/25/2019 1000   AST 22 03/04/2022 0231   ALT 33 03/04/2022 0231   ALKPHOS 136 (H) 03/04/2022 0231   BILITOT 0.6 03/04/2022 0231   BILITOT 0.3 04/25/2019  1000   GFRNONAA 43 (L) 03/04/2022 0231   GFRAA 34 (L) 04/25/2019 1000   Lipase     Component Value Date/Time   LIPASE 62 (H) 02/21/2022 0142       Studies/Results: DG Abd Portable 1V  Result Date: 03/02/2022 CLINICAL DATA:  Check gastric catheter placement EXAM: PORTABLE ABDOMEN - 1 VIEW COMPARISON:  07/31/2021 FINDINGS: Gastric catheter is noted deep within the stomach. Scattered large and small bowel gas is noted. Mild persistent small bowel dilatation is seen. Previously administered contrast lies within the colon. IMPRESSION: Gastric catheter deep in the stomach. Persistent mild partial small bowel obstruction. Electronically Signed   By: William [name] M.D.   On: 03/02/2022 20:40    Anti-infectives: Anti-infectives (From admission, onward)    None        Assessment/Plan Choledocholithiasis POD#9 lap chole Dr. Thermon Leyland 9/12 -MRCP with cholelithiasis and multiple mid to distal CBD stones.  Pancreas grossly unremarkable in setting of elevated lipase -ERCP 9/11. Stones removed and sphincterotomy performed -Lap chole 9/12, discharged 9/14 -Readmit 9/15 with ileus -NGT in place.  Will continue today -remain NPO x ice -mobilize -temp has resolved in last 24 hours.  WBC down from 15 to 13 today with no intervention.CXR pending per primary.  Will continue to monitor  FEN -  NPO/NGT/IVFs VTE - Plavix ID - none currently needed     Per primary: CVA w/ left sided hemiplegia (2006, 07/2021) - on plavix HTN HLD Carotid artery stenosis s/p b/l [name] CKD stage IV Remote hx of bladder cancer s/p TURP  GERD     LOS: 5 days    William [name], [facility] 03/04/2022, 10:49 AM Please see Amion for pager number during day hours 7:00am-4:30pm

## 2022-03-04 NOTE — Progress Notes (Signed)
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   CT abdomen and pelvis with fluid in esophagus, stomach and postoperative changes and concern for ileus.  Surgical team consulted.  Patient was started on IV fluid and eventually needed NG tube.   Ileus/partial obstruction seems to have resolved clinically and radiologically.  NG tube discontinued. Overnight on 9/19, pt again became symtomatic and required replacement of NG.  Assessment and Plan: Choledocholithiasis s/p ERCP on 9/11 and lap chole on 9/12.  Discharged on 9/14. Postoperative ileus: presents with emesis, presyncope and abdominal pain.  CT showed moderate fluid within distal esophagus and stomach.  Ileus/partial SBO seemed improved radiologically. -Surgery managing-NGT initially discontinued. -Continue LR at 75 cc an hour -NG replaced recently. Per general surgery, recs to continue NG and NPO status for today    AKI on CKD-3B: Baseline Cr 1.6-1.7.  AKI likely due to the above.  Resolved. -Continue basal IVF -recheck bmet in AM   Hypernatremia . 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 -Continue amantadine, tegretol, lorazepam, trazodone -Resume Plavix when able to -PT/OT   Normocytic iron deficiency anemia: Anemia panel consistent with iron deficiency.  Drop in Hgb partly dilutional. -IV ferric gluconate 250 mg daily x2 doses -hgb remains stable -Cont to follow CBC trends     Essential hypertension: Normotensive. -Continue amlodipine,  hydralazine, tamsulosin as able to     Hyperlipidemia - Monitor, not currently on therapy due to intolerance    Anxiety state -Continue lorazepam at home doses -Has not started hydroxyzine outpatient   BPH -Monitor UOP (I/Os) -Tamsulosin at home dose   Elevated troponin: Mild.  Likely delayed clearance   Leukocytosis/bandemia: Likely demargination versus infection.  Resolved.   Physical deconditioning: -Therapy recommended SNF, however plans for possible d/c home when stable  Hypomagnesemia -Replaced  Hypokalemia -Replaced   Body mass index is 25.1 kg/m.   DVT prophylaxis:  Place and maintain sequential compression device Start: 02/26/22 2141   Code Status: DNR/DNI Family Communication: Updated patient's wife at bedside Level of care: Med-Surg Status is: Inpatient The patient will remain inpatient because: Persistent postoperative ileus requiring NG tube and IV fluid       Subjective: States feels better this AM  Physical Exam: Vitals:   03/03/22 1640 03/03/22 2057 03/04/22 0638 03/04/22 0935  BP: (!) 148/59 135/65 (!) 146/61 (!) 162/76  Pulse: 72 81 84 80  Resp: '16 16 15 16  '$ Temp: 98.4 F (36.9 C) 98.3 F (36.8 C) 98.5 F (36.9 C) 98.3 F (36.8 C)  TempSrc: Oral   Oral  SpO2: 94% 96% 95% 97%  Weight:      Height:       General exam: Conversant, in no acute distress Respiratory system: normal chest rise, clear, no audible wheezing Cardiovascular system: regular rhythm, s1-s2 Gastrointestinal system: Nondistended, nontender, NG in place Central nervous system: No seizures, no tremors Extremities: No cyanosis, no joint deformities Skin: No rashes, no pallor Psychiatry: Affect normal // no auditory hallucinations   Data Reviewed:  Labs reviewed: K 3.6, Mg 2.1, Cr  1.61  Family Communication: Pt in room, family at bedside  Disposition: Status is: Inpatient Remains inpatient appropriate because: Severity of illness  Planned Discharge Destination:  Home    Author: Marylu Lund, MD 03/04/2022 12:37 PM  For on call review [email or web address].

## 2022-03-04 NOTE — Plan of Care (Signed)

## 2022-03-04 NOTE — Progress Notes (Signed)
Mobility Specialist Progress Note:   03/04/22 1205  Mobility  Activity Turned to left side;Turned to right side;Turned to back - supine  Level of Assistance +2 (takes two people)  Assistive Device None  Activity Response Tolerated well  $Mobility charge 1 Mobility   NT requested assistance with rolling for pericare. Pt required modA to turn. Left with all needs met.   Nelta Numbers Acute Rehab Secure Chat or Office Phone: [phone]

## 2022-03-05 ENCOUNTER — Inpatient Hospital Stay (HOSPITAL_COMMUNITY): Payer: PPO

## 2022-03-05 ENCOUNTER — Inpatient Hospital Stay (HOSPITAL_BASED_OUTPATIENT_CLINIC_OR_DEPARTMENT_OTHER): Payer: PPO

## 2022-03-05 DIAGNOSIS — F419 Anxiety disorder, unspecified: Secondary | ICD-10-CM | POA: Diagnosis not present

## 2022-03-05 DIAGNOSIS — M7989 Other specified soft tissue disorders: Secondary | ICD-10-CM

## 2022-03-05 DIAGNOSIS — R609 Edema, unspecified: Secondary | ICD-10-CM | POA: Diagnosis not present

## 2022-03-05 DIAGNOSIS — K8050 Calculus of bile duct without cholangitis or cholecystitis without obstruction: Secondary | ICD-10-CM | POA: Diagnosis not present

## 2022-03-05 DIAGNOSIS — K567 Ileus, unspecified: Secondary | ICD-10-CM | POA: Diagnosis not present

## 2022-03-05 DIAGNOSIS — K9189 Other postprocedural complications and disorders of digestive system: Secondary | ICD-10-CM | POA: Diagnosis not present

## 2022-03-05 LAB — COMPREHENSIVE METABOLIC PANEL
ALT: 34 U/L (ref 0–44)
AST: 25 U/L (ref 15–41)
Albumin: 2 g/dL — ABNORMAL LOW (ref 3.5–5.0)
Alkaline Phosphatase: 136 U/L — ABNORMAL HIGH (ref 38–126)
Anion gap: 11 (ref 5–15)
BUN: 19 mg/dL (ref 8–23)
CO2: 20 mmol/L — ABNORMAL LOW (ref 22–32)
Calcium: 8.1 mg/dL — ABNORMAL LOW (ref 8.9–10.3)
Chloride: 107 mmol/L (ref 98–111)
Creatinine, Ser: 1.47 mg/dL — ABNORMAL HIGH (ref 0.61–1.24)
GFR, Estimated: 48 mL/min — ABNORMAL LOW (ref >60)
Glucose, Bld: 96 mg/dL (ref 70–99)
Potassium: 3.7 mmol/L (ref 3.5–5.1)
Sodium: 138 mmol/L (ref 135–145)
Total Bilirubin: 0.6 mg/dL (ref 0.3–1.2)
Total Protein: 5.4 g/dL — ABNORMAL LOW (ref 6.5–8.1)

## 2022-03-05 LAB — CBC
HCT: 22.9 % — ABNORMAL LOW (ref 39.0–52.0)
Hemoglobin: 7.8 g/dL — ABNORMAL LOW (ref 13.0–17.0)
MCH: 33.1 pg (ref 26.0–34.0)
MCHC: 34.1 g/dL (ref 30.0–36.0)
MCV: 97 fL (ref 80.0–100.0)
Platelets: 204 10*3/uL (ref 150–400)
RBC: 2.36 MIL/uL — ABNORMAL LOW (ref 4.22–5.81)
RDW: 13.2 % (ref 11.5–15.5)
WBC: 11.6 10*3/uL — ABNORMAL HIGH (ref 4.0–10.5)
nRBC: 0 % (ref 0.0–0.2)

## 2022-03-05 LAB — MAGNESIUM: Magnesium: 1.8 mg/dL (ref 1.7–2.4)

## 2022-03-05 NOTE — Progress Notes (Addendum)
[facility] Progress Note     Subjective: CC-  Wife at bedside. Patient passing flatus. He had 1 small BM yesterday. Wife states that he started saying he is finally hungry yesterday and today. NG with 1.3L output, dark green effluent in canister.  Objective: Vital signs in last 24 hours: Temp:  [98 F (36.7 C)-99.7 F (37.6 C)] 98 F (36.7 C) (09/22 0947) Pulse Rate:  [78-86] 79 (09/22 0947) Resp:  [15-19] 18 (09/22 0947) BP: (149-168)/(69-80) 149/80 (09/22 0947) SpO2:  [94 %-98 %] 94 % (09/22 0947) Last BM Date : 03/04/22  Intake/Output from previous day: 09/21 0701 - 09/22 0700 In: 2145.2 [I.V.:1545.2; NG/GT:200; IV Piggyback:400] Out: 3300 [Urine:2000; Emesis/NG output:1300] Intake/Output this shift: No intake/output data recorded.  PE: Gen:  Alert, NAD, pleasant Abd: soft, nondistended, nontender, lap incisions cdi.  NGT in place with dark green effluent in canister   Lab Results:  Recent Labs    03/04/22 0231 03/05/22 0638  WBC 13.6* 11.6*  HGB 7.9* 7.8*  HCT 23.4* 22.9*  PLT 199 204   BMET Recent Labs    03/04/22 0231 03/05/22 0638  NA 142 138  K 3.6 3.7  CL 110 107  CO2 24 20*  GLUCOSE 109* 96  BUN 19 19  CREATININE 1.61* 1.47*  CALCIUM 8.3* 8.1*   PT/INR No results for input(s): "LABPROT", "INR" in the last 72 hours. CMP     Component Value Date/Time   NA 138 03/05/2022 0638   NA 142 04/25/2019 1000   K 3.7 03/05/2022 0638   CL 107 03/05/2022 0638   CO2 20 (L) 03/05/2022 0638   GLUCOSE 96 03/05/2022 0638   BUN 19 03/05/2022 0638   BUN 30 (H) 04/25/2019 1000   CREATININE 1.47 (H) 03/05/2022 0638   CREATININE 1.71 (H) 12/25/2021 1433   CALCIUM 8.1 (L) 03/05/2022 0638   PROT 5.4 (L) 03/05/2022 0638   PROT 7.4 04/25/2019 1000   ALBUMIN 2.0 (L) 03/05/2022 0638   ALBUMIN 4.2 04/25/2019 1000   AST 25 03/05/2022 0638   ALT 34 03/05/2022 0638   ALKPHOS 136 (H) 03/05/2022 0638   BILITOT 0.6 03/05/2022 0638   BILITOT 0.3  04/25/2019 1000   GFRNONAA 48 (L) 03/05/2022 0638   GFRAA 34 (L) 04/25/2019 1000   Lipase     Component Value Date/Time   LIPASE 62 (H) 02/21/2022 0142       Studies/Results: DG CHEST PORT 1 VIEW  Result Date: 03/04/2022 CLINICAL DATA:  Weakness and confusion. History of stroke and hypertension. EXAM: PORTABLE CHEST 1 VIEW COMPARISON:  Chest two views 01/28/2022 and 08/01/2021 FINDINGS: Enteric tube descends below the diaphragm with the tip excluded by collimation. Cardiac silhouette is at the upper limits of normal size. Mediastinal contours are within normal limits. Mild calcification within the aortic arch. Mild bibasilar interstitial thickening appears similar to prior and chronic. No focal airspace opacity to indicate pneumonia. No pleural effusion pneumothorax. Moderate multilevel degenerative disc changes of the thoracic spine. IMPRESSION: No acute cardiopulmonary disease process. Electronically Signed   By: Yvonne Kendall M.D.   On: 03/04/2022 11:52    Anti-infectives: Anti-infectives (From admission, onward)    None        Assessment/Plan Choledocholithiasis POD#10 lap chole Dr. Thermon Leyland 9/12 -MRCP with cholelithiasis and multiple mid to distal CBD stones.  Pancreas grossly unremarkable in setting of elevated lipase -ERCP 9/11. Stones removed and sphincterotomy performed -Lap chole 9/12, discharged 9/14 -Readmit 9/15 with ileus - Having some bowel  function but also high volume NG output, may be some water he is drinking but fluid in canister this morning is dark green - Will repeat abdominal film today - Continue NPO/NGT to LIWS for now. On day #3/3 of reglan. If unable to advance diet soon will need to consider PICC/TPN -mobilize, PT   FEN - NPO/NGT/IVFs VTE - Plavix ID - none currently needed     Per primary: CVA w/ left sided hemiplegia (2006, 07/2021) - on plavix HTN HLD Carotid artery stenosis s/p b/l CEA CKD stage IV Remote hx of bladder cancer s/p  TURP  GERD     LOS: 6 days    [place]ellington [place], [facility] 03/05/2022, 11:20 AM Please see Amion for pager number during day hours 7:00am-4:30pm

## 2022-03-05 NOTE — Progress Notes (Signed)
Occupational Therapy Treatment Patient Details Name: William [name] MRN: [id number] DOB: 08/16/1940 Today's Date: 03/05/2022   History of present illness 81 yo male returns to [facility] after ERCP and lap chole with abdominal pain, emesis, presyncope and found to have ileus. NG tube placed 9/16.  PMHx:  stroke with L  hemiparesis, CKD3, HTN, vascular dementia, bladder cancer, choledocholithiasis s/p ERCP   OT comments  Patient continues to make incremental progress towards goals in skilled OT session. Patient's session encompassed co-treat with PT to advance mobility and increase overall activity tolerance. Patient mod A of 2 with use of back of recliner in order to come into standing, with ability to weight shift with assist. Patient then able to complete additional sit<>stand and take incremental steps to [name]. Patient placed in chair position at end of session in order to promote increased activity tolerance for future sessions. OT will continue to follow acutely.      Recommendations for follow up therapy are one component of a multi-disciplinary discharge planning process, led by the attending physician.  Recommendations may be updated based on patient status, additional functional criteria and insurance authorization.    Follow Up Recommendations  Skilled nursing-short term rehab (<3 hours/day)    Assistance Recommended at Discharge Frequent or constant Supervision/Assistance  Patient can return home with the following  A lot of help with walking and/or transfers;A lot of help with bathing/dressing/bathroom;Assistance with cooking/housework;Direct supervision/assist for medications management;Direct supervision/assist for financial management;Assist for transportation;Help with stairs or ramp for entrance   Equipment Recommendations  None recommended by OT;Other (comment) (patient has DME needed)    Recommendations for Other Services PT consult    Precautions / Restrictions  Precautions Precautions: Fall;Other (comment) Precaution Comments: Lft hemiparesis Restrictions Weight Bearing Restrictions: No Other Position/Activity Restrictions: per wife has L clavicle fx, non-op       Mobility Bed Mobility Overal bed mobility: Needs Assistance Bed Mobility: Rolling, Sidelying to Sit Rolling: Max assist, +2 for physical assistance Sidelying to sit: Max assist, +2 for physical assistance, Total assist, HOB elevated     Sit to sidelying: Max assist, +2 for physical assistance General bed mobility comments: Cues for rolling to right and reaching for rail with LUE, assist needed to flex left knee, roll and elevate trunk to get to EOB. Max cueing. Assist to bring LEs into bed to return to supine.    Transfers Overall transfer level: Needs assistance Equipment used: 2 person hand held assist (back of chair) Transfers: Sit to/from Stand Sit to Stand: Mod assist, +2 physical assistance, From elevated surface     Step pivot transfers: Max assist, +2 physical assistance     General transfer comment: Mod A of 2 to power to standing using back of recliner x1 and 2 person assist/pad for second bout without chair. Incontinent of stool upon standing. Declined tx to chair.     Balance Overall balance assessment: Needs assistance Sitting-balance support: Feet supported, No upper extremity supported Sitting balance-Leahy Scale: Poor Sitting balance - Comments: Needing Mod A initially progressing to min A-Min guard assist with cues for anterior lean./weight shift, needs UE support. Postural control: Posterior lean Standing balance support: During functional activity, Bilateral upper extremity supported Standing balance-Leahy Scale: Poor Standing balance comment: reliant on UE support. Weight shifting to right/left x10                           ADL either performed or assessed with clinical judgement  ADL Overall ADL's : Needs assistance/impaired                              Toileting- Clothing Manipulation and Hygiene: Total assistance;+2 for safety/equipment;+2 for physical assistance Toileting - Clothing Manipulation Details (indicate cue type and reason): peri-care in standing     Functional mobility during ADLs: Moderate assistance;+2 for physical assistance;+2 for safety/equipment General ADL Comments: Session focus on increasing activity tolerance, sit<>stands, and OOB mobility    Extremity/Trunk Assessment              Vision       Perception     Praxis      Cognition Arousal/Alertness: Awake/alert Behavior During Therapy: Flat affect Overall Cognitive Status: History of cognitive impairments - at baseline                                 General Comments: Needs repetition and step by step instructions with increased time.        Exercises      Shoulder Instructions       General Comments Wife present during session.    Pertinent Vitals/ Pain       Pain Assessment Pain Assessment: Faces Faces Pain Scale: Hurts little more Pain Location: feet when donning socks Pain Descriptors / Indicators: Discomfort Pain Intervention(s): Monitored during session, Repositioned  Home Living                                          Prior Functioning/Environment              Frequency  Min 2X/week        Progress Toward Goals  OT Goals(current goals can now be found in the care plan section)  Progress towards OT goals: Progressing toward goals  Acute Rehab OT Goals Patient Stated Goal: to feel better OT Goal Formulation: With patient Time For Goal Achievement: 03/14/22 Potential to Achieve Goals: [place] Discharge plan remains appropriate    Co-evaluation                 AM-PAC OT "6 Clicks" Daily Activity     Outcome Measure   Help from another person eating meals?: A Little Help from another person taking care of personal grooming?: A  Little Help from another person toileting, which includes using toliet, bedpan, or urinal?: Total Help from another person bathing (including washing, rinsing, drying)?: A Lot Help from another person to put on and taking off regular upper body clothing?: A Lot Help from another person to put on and taking off regular lower body clothing?: Total 6 Click Score: 12    End of Session Equipment Utilized During Treatment: Gait belt  OT Visit Diagnosis: Unsteadiness on feet (R26.81);Other abnormalities of gait and mobility (R26.89);Muscle weakness (generalized) (M62.81)   Activity Tolerance Patient limited by fatigue   Patient Left in bed;with call bell/phone within reach;with nursing/sitter in room;with family/visitor present (chair position)   Nurse Communication Mobility status        Time: 8250-5397 OT Time Calculation (min): 25 min  Charges: OT General Charges $OT Visit: 1 Visit OT Treatments $Self Care/Home Management : 8-22 mins  William [name], OTR/L Acute Rehabilitation Services [phone]   William [name] 03/05/2022,  2:46 PM

## 2022-03-05 NOTE — Progress Notes (Signed)
Physical Therapy Treatment Patient Details Name: William [name] MRN: [id number] DOB: [birth year] Today's Date: 03/05/2022   History of Present Illness 81 yo male returns to [facility] after ERCP and lap chole with abdominal pain, emesis, presyncope and found to have ileus. NG tube placed 9/16.  PMHx:  stroke with L  hemiparesis, CKD3, HTN, vascular dementia, bladder cancer, choledocholithiasis s/p ERCP    PT Comments    Patient progressing slowly towards PT goals. Session focused on sitting balance, standing balance and pre gait. Requires max of 2 for bed mobility with step by step cues for sequencing with repetition. Able to stand from EOB with mod A of 2 and side step along side the bed with Mod A of 2. Fatigues quickly. Declined transfer to chair today. Will consult mobility tech as pt needs to be sitting upright/increasing activity as well as getting in chair daily to prevent dizziness. Pt will likely be ready to initiate gait training next session. Continues to be appropriate for SNF. Will follow.    Recommendations for follow up therapy are one component of a multi-disciplinary discharge planning process, led by the attending physician.  Recommendations may be updated based on patient status, additional functional criteria and insurance authorization.  Follow Up Recommendations  Skilled nursing-short term rehab (<3 hours/day) Can patient physically be transported by private vehicle: No   Assistance Recommended at Discharge Frequent or constant Supervision/Assistance  Patient can return home with the following Direct supervision/assist for medications management;Direct supervision/assist for financial management;Assist for transportation;Help with stairs or ramp for entrance;Two people to help with walking and/or transfers;Two people to help with bathing/dressing/bathroom   Equipment Recommendations  None recommended by PT    Recommendations for Other Services       Precautions /  Restrictions Precautions Precautions: Fall;Other (comment) Precaution Comments: Lft hemiparesis Restrictions Weight Bearing Restrictions: No Other Position/Activity Restrictions: per wife has L clavicle fx, non-op     Mobility  Bed Mobility Overal bed mobility: Needs Assistance Bed Mobility: Rolling, Sidelying to Sit Rolling: Max assist, +2 for physical assistance Sidelying to sit: Max assist, +2 for physical assistance, Total assist, HOB elevated     Sit to sidelying: Max assist, +2 for physical assistance General bed mobility comments: Cues for rolling to right and reaching for rail with LUE, assist needed to flex left knee, roll and elevate trunk to get to EOB. Max cueing. Assist to bring LEs into bed to return to supine.    Transfers Overall transfer level: Needs assistance Equipment used: 2 person hand held assist (back of chair) Transfers: Sit to/from Stand Sit to Stand: Mod assist, +2 physical assistance, From elevated surface           General transfer comment: Mod A of 2 to power to standing using back of recliner x1 and 2 person assist/pad for second bout without chair. Incontinent of stool upon standing. Declined tx to chair.    Ambulation/Gait Ambulation/Gait assistance: Mod assist, +2 physical assistance Gait Distance (Feet): 3 Feet Assistive device: 2 person hand held assist   Gait velocity: decreased Gait velocity interpretation: <1.31 ft/sec, indicative of household ambulator   General Gait Details: Able to side step along side bed with assist for weight shifting, advancing RLE and balance, fatigues.   Stairs             Wheelchair Mobility    Modified Rankin (Stroke Patients Only) Modified Rankin (Stroke Patients Only) Pre-Morbid Rankin Score: Moderately severe disability Modified Rankin: Severe disability  Balance Overall balance assessment: Needs assistance Sitting-balance support: Feet supported, No upper extremity  supported Sitting balance-Leahy Scale: Poor Sitting balance - Comments: Needing Mod A initially progressing to min A-Min guard assist with cues for anterior lean./weight shift, needs UE support. Postural control: Posterior lean Standing balance support: During functional activity, Bilateral upper extremity supported Standing balance-Leahy Scale: Poor Standing balance comment: reliant on UE support. Weight shifting to right/left x10                            Cognition Arousal/Alertness: Awake/alert Behavior During Therapy: Flat affect Overall Cognitive Status: History of cognitive impairments - at baseline                                 General Comments: Needs repetition and step by step instructions with increased time.        Exercises General Exercises - Lower Extremity Long Arc Quad: AROM, Both, 10 reps, Seated    General Comments General comments (skin integrity, edema, etc.): Wife present during session.      Pertinent Vitals/Pain Pain Assessment Pain Assessment: Faces Faces Pain Scale: Hurts little more Pain Location: feet when donning socks Pain Descriptors / Indicators: Discomfort Pain Intervention(s): Monitored during session, Repositioned    Home Living                          Prior Function            PT Goals (current goals can now be found in the care plan section) Progress towards PT goals: Progressing toward goals    Frequency    Min 3X/week      PT Plan Current plan remains appropriate    Co-evaluation              AM-PAC PT "6 Clicks" Mobility   Outcome Measure  Help needed turning from your back to your side while in a flat bed without using bedrails?: A Lot Help needed moving from lying on your back to sitting on the side of a flat bed without using bedrails?: Total Help needed moving to and from a bed to a chair (including a wheelchair)?: Total Help needed standing up from a chair using your  arms (e.g., wheelchair or bedside chair)?: Total Help needed to walk in hospital room?: Total Help needed climbing 3-5 steps with a railing? : Total 6 Click Score: 7    End of Session Equipment Utilized During Treatment: Gait belt Activity Tolerance: Patient tolerated treatment well;Patient limited by fatigue Patient left: in bed;with call bell/phone within reach;with bed alarm set;with family/visitor present Nurse Communication: Mobility status;Other (comment) (stedy) PT Visit Diagnosis: Unsteadiness on feet (R26.81);Muscle weakness (generalized) (M62.81);Difficulty in walking, not elsewhere classified (R26.2);Hemiplegia and hemiparesis Hemiplegia - Right/Left: Left Hemiplegia - dominant/non-dominant: Non-dominant Hemiplegia - caused by: Nontraumatic intracerebral hemorrhage     Time: 7341-9379 PT Time Calculation (min) (ACUTE ONLY): 28 min  Charges:  $Therapeutic Activity: 8-22 mins                     Marisa Severin, PT, DPT Acute Rehabilitation Services Secure chat preferred Office [place] 03/05/2022, 12:45 PM

## 2022-03-05 NOTE — Progress Notes (Signed)
Progress Note   Patient: William [name] HQP:[id number] DOB: [birth year] DOA: 02/26/2022     6 DOS: the patient was seen and examined on 03/05/2022   Brief hospital course: 81 year old M with PMH of CVA with residual left hemiparesis and vascular dementia, CKD-4, bladder cancer, BPH, GERD, osteoarthritis and recent hospitalization from 9/8-9/14 for choledocholithiasis for which he underwent ERCP and lap chole returning with emesis and presyncope, and admitted for ileus.   CT abdomen and pelvis with fluid in esophagus, stomach and postoperative changes and concern for ileus.  Surgical team consulted.  Patient was started on IV fluid and eventually needed NG tube.   Ileus/partial obstruction seems to have resolved clinically and radiologically.  NG tube discontinued. Overnight on 9/19, pt again became symtomatic and required replacement of NG.  Assessment and Plan: Choledocholithiasis s/p ERCP on 9/11 and lap chole on 9/12.  Discharged on 9/14. Postoperative ileus: presents with emesis, presyncope and abdominal pain.  CT showed moderate fluid within distal esophagus and stomach.  Ileus/partial SBO seemed improved radiologically. -Surgery managing-NGT initially discontinued. -Continue LR at 75 cc an hour -NG replaced recently. Per general surgery, recs to continue NG until some sign of bowel function    AKI on CKD-3B: Baseline Cr 1.6-1.7.  AKI likely due to the above.  Resolved. -Continue basal IVF -repeat bmet in AM   Hypernatremia . 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 -Continue amantadine, tegretol, lorazepam, trazodone -Resumed Plavix  -PT/OT   Normocytic iron deficiency anemia: Anemia panel consistent with iron deficiency.  Drop in Hgb partly dilutional. -IV ferric gluconate 250 mg daily x2 doses -hgb remains stable -Cont to follow CBC trends     Essential hypertension: Normotensive. -Continue amlodipine,  hydralazine, tamsulosin as able to     Hyperlipidemia - Monitor, not currently on therapy due to intolerance    Anxiety state -Continue lorazepam at home doses -Has not started hydroxyzine outpatient   BPH -Monitor UOP (I/Os) -Tamsulosin at home dose   Elevated troponin: Mild.  Likely delayed clearance   Leukocytosis/bandemia: Likely demargination versus infection.  Resolved.   Physical deconditioning: -Therapy recommended SNF, however plans for possible d/c home when stable  Hypomagnesemia -Replaced  Hypokalemia -Replaced  LUE swelling secondary to superficial DVT -Ordered and reviewed LUE doppler. Finding of superficial DVT -Cont with heat compress as needed   Body mass index is 25.1 kg/m.   DVT prophylaxis:  Place and maintain sequential compression device Start: 02/26/22 2141   Code Status: DNR/DNI Family Communication: Updated patient's wife at bedside Level of care: Med-Surg Status is: Inpatient The patient will remain inpatient because: Persistent postoperative ileus requiring NG tube and IV fluid       Subjective: No complaints this AM  Physical Exam: Vitals:   03/04/22 1644 03/04/22 2143 03/05/22 0519 03/05/22 0947  BP: (!) 168/69 (!) 163/71 (!) 161/78 (!) 149/80  Pulse: 79 86 78 79  Resp: '18 19 15 18  '$ Temp: 99.7 F (37.6 C) 98.6 F (37 C) 98.6 F (37 C) 98 F (36.7 C)  TempSrc: Oral     SpO2: 97% 98% 96% 94%  Weight:      Height:       General exam: Awake, laying in bed, in nad Respiratory system: Normal respiratory effort, no wheezing Cardiovascular system: regular rate, s1, s2 Gastrointestinal system: Soft, nondistended, positive BS Central nervous system: CN2-12 grossly intact, strength intact Extremities: Perfused, LUE swelling Skin: Normal skin turgor, no  notable skin lesions seen Psychiatry: Mood normal // no visual hallucinations   Data Reviewed:  Labs reviewed: K 3.7, Mg 1.8, Cr 1.47  Family Communication: Pt in room,  family at bedside  Disposition: Status is: Inpatient Remains inpatient appropriate because: Severity of illness  Planned Discharge Destination: Home    Author: Marylu Lund, MD 03/05/2022 4:06 PM  For on call review [email or web address].

## 2022-03-05 NOTE — TOC Progression Note (Signed)
Transition of Care [facility]) - Progression Note    Patient Details  Name: William [name] MRN: [id number] Date of Birth: August 20, 1940  Transition of Care [facility]) CM/SW Contact  Tom-Johnson, Renea Ee, RN Phone Number: 03/05/2022, 10:32 AM  Clinical Narrative:     Patient continues to have persistent postoperative Ileus requiring NG tube and IV fluid. Left arm swelling,possible ultrasound.  CM will continue to follow as patient progresses with care towards discharge.   Expected Discharge Plan: [place] Barriers to Discharge: Continued Medical Work up  Expected Discharge Plan and Services Expected Discharge Plan: [place]   Discharge Planning Services: CM Consult Post Acute Care Choice: [place] arrangements for the past 2 months: Single Family Home                 DME Arranged: N/A DME Agency: NA       HH Arranged: PT, OT (Custodial Services) [place]: [place] ([place]) Date [place]: 03/02/22 Time [place]: 8850 Representative spoke with at [place]: [place] ([place]) Interventions    Readmission Risk Interventions    02/23/2022    1:27 PM  Readmission Risk Prevention Plan  Transportation Screening Complete  PCP or Specialist Appt within 3-5 Days Complete  HRI or [place] Complete  Social Work Consult for [place] Planning/Counseling Complete  Palliative Care Screening Complete  Medication Review Press photographer) Complete

## 2022-03-05 NOTE — Progress Notes (Signed)
NG tube discontinued per order.  Pt tolerated fair.  Wife feeding pt jello for the first time.

## 2022-03-05 NOTE — Progress Notes (Signed)
Upper extremity venous duplex has been completed.   Preliminary results in CV Proc.   Kieran Nachtigal Jnya Brossard 03/05/2022 1:26 PM

## 2022-03-06 ENCOUNTER — Inpatient Hospital Stay: Payer: Self-pay

## 2022-03-06 DIAGNOSIS — F419 Anxiety disorder, unspecified: Secondary | ICD-10-CM | POA: Diagnosis not present

## 2022-03-06 DIAGNOSIS — K567 Ileus, unspecified: Secondary | ICD-10-CM | POA: Diagnosis not present

## 2022-03-06 DIAGNOSIS — K8050 Calculus of bile duct without cholangitis or cholecystitis without obstruction: Secondary | ICD-10-CM | POA: Diagnosis not present

## 2022-03-06 DIAGNOSIS — K9189 Other postprocedural complications and disorders of digestive system: Secondary | ICD-10-CM | POA: Diagnosis not present

## 2022-03-06 LAB — COMPREHENSIVE METABOLIC PANEL
ALT: 35 U/L (ref 0–44)
AST: 32 U/L (ref 15–41)
Albumin: 2 g/dL — ABNORMAL LOW (ref 3.5–5.0)
Alkaline Phosphatase: 143 U/L — ABNORMAL HIGH (ref 38–126)
Anion gap: 12 (ref 5–15)
BUN: 12 mg/dL (ref 8–23)
CO2: 20 mmol/L — ABNORMAL LOW (ref 22–32)
Calcium: 8 mg/dL — ABNORMAL LOW (ref 8.9–10.3)
Chloride: 105 mmol/L (ref 98–111)
Creatinine, Ser: 1.36 mg/dL — ABNORMAL HIGH (ref 0.61–1.24)
GFR, Estimated: 53 mL/min — ABNORMAL LOW (ref >60)
Glucose, Bld: 102 mg/dL — ABNORMAL HIGH (ref 70–99)
Potassium: 3.5 mmol/L (ref 3.5–5.1)
Sodium: 137 mmol/L (ref 135–145)
Total Bilirubin: 0.5 mg/dL (ref 0.3–1.2)
Total Protein: 5.2 g/dL — ABNORMAL LOW (ref 6.5–8.1)

## 2022-03-06 LAB — CBC
HCT: 26.8 % — ABNORMAL LOW (ref 39.0–52.0)
Hemoglobin: 8.9 g/dL — ABNORMAL LOW (ref 13.0–17.0)
MCH: 32 pg (ref 26.0–34.0)
MCHC: 33.2 g/dL (ref 30.0–36.0)
MCV: 96.4 fL (ref 80.0–100.0)
Platelets: 234 10*3/uL (ref 150–400)
RBC: 2.78 MIL/uL — ABNORMAL LOW (ref 4.22–5.81)
RDW: 13 % (ref 11.5–15.5)
WBC: 10.3 10*3/uL (ref 4.0–10.5)
nRBC: 0 % (ref 0.0–0.2)

## 2022-03-06 MED ORDER — CHLORHEXIDINE GLUCONATE CLOTH 2 % EX PADS
6.0000 | MEDICATED_PAD | Freq: Every day | CUTANEOUS | Status: DC
  Administered 2022-03-06 – 2022-03-12 (×7): 6 via TOPICAL

## 2022-03-06 MED ORDER — SODIUM CHLORIDE 0.9% FLUSH
10.0000 mL | INTRAVENOUS | Status: DC | PRN
  Administered 2022-03-07 – 2022-03-09 (×3): 10 mL

## 2022-03-06 MED ORDER — AMLODIPINE BESYLATE 5 MG PO TABS
5.0000 mg | ORAL_TABLET | Freq: Every day | ORAL | Status: DC
  Administered 2022-03-06 – 2022-03-07 (×2): 5 mg
  Filled 2022-03-06 (×2): qty 1

## 2022-03-06 MED ORDER — SODIUM CHLORIDE 0.9% FLUSH
10.0000 mL | Freq: Two times a day (BID) | INTRAVENOUS | Status: DC
  Administered 2022-03-06 – 2022-03-12 (×10): 10 mL

## 2022-03-06 NOTE — Plan of Care (Signed)

## 2022-03-06 NOTE — Progress Notes (Signed)
Subjective/Chief Complaint: Wife feels he is more lethargic today but he did just get some IV ativan. Arousable and appropriate   Objective: Vital signs in last 24 hours: Temp:  [98.4 F (36.9 C)-99.6 F (37.6 C)] 98.4 F (36.9 C) (09/23 0921) Pulse Rate:  [75-84] 84 (09/23 0921) Resp:  [16-18] 18 (09/23 0921) BP: (146-173)/(72-77) 146/77 (09/23 0921) SpO2:  [94 %-95 %] 94 % (09/23 0921) Last BM Date : 03/05/22  Intake/Output from previous day: 09/22 0701 - 09/23 0700 In: 844.9 [I.V.:604.9; NG/GT:240] Out: 1700 [Urine:1300; Emesis/NG output:400] Intake/Output this shift: Total I/O In: 1199.3 [I.V.:1199.3] Out: 600 [Urine:600]  General appearance: alert and cooperative Resp: clear to auscultation bilaterally Cardio: regular rate and rhythm GI: soft, mild tenderness  Lab Results:  Recent Labs    03/05/22 0638 03/06/22 0739  WBC 11.6* 10.3  HGB 7.8* 8.9*  HCT 22.9* 26.8*  PLT 204 234   BMET Recent Labs    03/05/22 0638 03/06/22 0739  NA 138 137  K 3.7 3.5  CL 107 105  CO2 20* 20*  GLUCOSE 96 102*  BUN 19 12  CREATININE 1.47* 1.36*  CALCIUM 8.1* 8.0*   PT/INR No results for input(s): "LABPROT", "INR" in the last 72 hours. ABG No results for input(s): "PHART", "HCO3" in the last 72 hours.  Invalid input(s): "PCO2", "PO2"  Studies/Results: VAS [place] UPPER EXTREMITY VENOUS DUPLEX  Result Date: 03/05/2022 UPPER VENOUS STUDY  Patient Name:  RAJAT STAVER  Date of Exam:   03/05/2022 Medical Rec #: [id number]       Accession #:    [id number] Date of Birth: August 10, 1940       Patient Gender: M Patient Age:   81 years Exam Location:  [facility] Procedure:      VAS [place] UPPER EXTREMITY VENOUS DUPLEX Referring Phys: STEPHEN CHIU --------------------------------------------------------------------------------  Indications: Edema, and Swelling Comparison Study: no prior Performing Technologist: Archie Patten RVS  Examination Guidelines: A complete evaluation  includes B-mode imaging, spectral Doppler, color Doppler, and power Doppler as needed of all accessible portions of each vessel. Bilateral testing is considered an integral part of a complete examination. Limited examinations for reoccurring indications may be performed as noted.  Right Findings: +----------+------------+---------+-----------+----------+-------+ RIGHT     CompressiblePhasicitySpontaneousPropertiesSummary +----------+------------+---------+-----------+----------+-------+ Subclavian               Yes       Yes                      +----------+------------+---------+-----------+----------+-------+  Left Findings: +----------+------------+---------+-----------+----------+--------------------+ LEFT      CompressiblePhasicitySpontaneousProperties      Summary        +----------+------------+---------+-----------+----------+--------------------+ IJV           Full       Yes       Yes                                   +----------+------------+---------+-----------+----------+--------------------+ Subclavian    Full       Yes       Yes                                   +----------+------------+---------+-----------+----------+--------------------+ Axillary      Full       Yes       Yes                                   +----------+------------+---------+-----------+----------+--------------------+  Brachial      Full       Yes       Yes                                   +----------+------------+---------+-----------+----------+--------------------+ Radial        Full                                                       +----------+------------+---------+-----------+----------+--------------------+ Ulnar         Full                                                       +----------+------------+---------+-----------+----------+--------------------+ Cephalic      None                                   mid-distal forearm                                                              level         +----------+------------+---------+-----------+----------+--------------------+ Basilic       Full                                                       +----------+------------+---------+-----------+----------+--------------------+  Summary:  Right: No evidence of thrombosis in the subclavian.  Left: No evidence of deep vein thrombosis in the upper extremity. Findings consistent with age indeterminate superficial vein thrombosis involving the left cephalic vein.  *See table(s) above for measurements and observations.  Diagnosing physician: Jamelle Haring Electronically signed by Jamelle Haring on 03/05/2022 at 10:38:37 PM.    Final    DG Abd Portable 1V  Result Date: 03/05/2022 CLINICAL DATA:  Postoperative ileus EXAM: PORTABLE ABDOMEN - 1 VIEW COMPARISON:  Radiograph 03/02/2022 FINDINGS: Nasogastric tube tip and side port overlie the upper abdomen, likely distal stomach. Improvement in small bowel distension with mildly persistent colonic distension. Small bowel measures up to 3.3 cm, previously up to 5.2 cm. IMPRESSION: Nasogastric tube tip and side port overlie the right upper abdomen, likely distal stomach. Interval improvement in bowel distension. Electronically Signed   By: Maurine Simmering M.D.   On: 03/05/2022 13:01    Anti-infectives: Anti-infectives (From admission, onward)    None       Assessment/Plan: s/p * No surgery found * Ileus persists. Stay on clears until bowel function returns Choledocholithiasis POD#11 lap chole Dr. Thermon Leyland 9/12 -MRCP with cholelithiasis and multiple mid to distal CBD stones.  Pancreas grossly unremarkable in setting of elevated lipase -ERCP 9/11. Stones removed and sphincterotomy performed -Lap chole 9/12, discharged 9/14 -Readmit 9/15 with ileus - Having some  bowel function but also high volume NG output, may be some water he is drinking but fluid in canister this morning is dark green -  Will repeat abdominal film today - Continue NPO/NGT out. On day #3/3 of reglan. If unable to advance diet soon will need to consider PICC/TPN -mobilize, PT   FEN - NPO/NGT/IVFs VTE - Plavix ID - none currently needed     Per primary: CVA w/ left sided hemiplegia (2006, 07/2021) - on plavix HTN HLD Carotid artery stenosis s/p b/l CEA CKD stage IV Remote hx of bladder cancer s/p TURP  GERD   LOS: 7 days    Autumn Messing III 03/06/2022

## 2022-03-06 NOTE — Progress Notes (Signed)
Peripherally Inserted Central Catheter Placement  The IV Nurse has discussed with the patient and/or persons authorized to consent for the patient, the purpose of this procedure and the potential benefits and risks involved with this procedure.  The benefits include less needle sticks, lab draws from the catheter, and the patient may be discharged home with the catheter. Risks include, but not limited to, infection, bleeding, blood clot (thrombus formation), and puncture of an artery; nerve damage and irregular heartbeat and possibility to perform a PICC exchange if needed/ordered by physician.  Alternatives to this procedure were also discussed.  Bard Power PICC patient education guide, fact sheet on infection prevention and patient information card has been provided to patient /or left at bedside.    PICC Placement Documentation  PICC Double Lumen 03/54/65 Right Basilic 40 cm 0 cm (Active)  Indication for Insertion or Continuance of Line Administration of hyperosmolar/irritating solutions (i.e. TPN, Vancomycin, etc.) 03/06/22 1400  Exposed Catheter (cm) 0 cm 03/06/22 1400  Site Assessment Clean, Dry, Intact 03/06/22 1400  Lumen #1 Status Saline locked;Flushed;Blood return noted 03/06/22 1400  Lumen #2 Status Saline locked;Flushed;Blood return noted 03/06/22 1400  Dressing Type Transparent;Securing device 03/06/22 1400  Dressing Status Antimicrobial disc in place;Clean, Dry, Intact 03/06/22 1400  Safety Lock Not Applicable 68/12/75 1700  Line Care Connections checked and tightened 03/06/22 1400  Line Adjustment (NICU/IV Team Only) No 03/06/22 1400  Dressing Intervention New dressing 03/06/22 1400  Dressing Change Due 03/13/22 03/06/22 1400       Rosalio Macadamia Chenice 03/06/2022, 2:14 PM

## 2022-03-06 NOTE — Progress Notes (Signed)
Progress Note   Patient: William [name] DOB: 1941/06/02 DOA: 02/26/2022     7 DOS: the patient was seen and examined on 03/06/2022   Brief hospital course: 81 year old M with PMH of CVA with residual left hemiparesis and vascular dementia, CKD-4, bladder cancer, BPH, GERD, osteoarthritis and recent hospitalization from 9/8-9/14 for choledocholithiasis for which he underwent ERCP and lap chole returning with emesis and presyncope, and admitted for ileus.   CT abdomen and pelvis with fluid in esophagus, stomach and postoperative changes and concern for ileus.  Surgical team consulted.  Patient was started on IV fluid and eventually needed NG tube.   Ileus/partial obstruction seems to have resolved clinically and radiologically.  NG tube discontinued. Overnight on 9/19, pt again became symtomatic and required replacement of NG.  Assessment and Plan: Choledocholithiasis s/p ERCP on 9/11 and lap chole on 9/12.  Discharged on 9/14. Postoperative ileus: presents with emesis, presyncope and abdominal pain.  CT showed moderate fluid within distal esophagus and stomach.  Ileus/partial SBO seemed improved radiologically. -Surgery managing-NGT now removed as of 9/22 -Continue LR at 75 cc an hour -Per Surgery, cont to keep NG out for now, if unable to advance diet soon, then would need to consider PICC/TPN    AKI on CKD-3B: Baseline Cr 1.6-1.7.  AKI likely due to the above.  Resolved. -Continue basal IVF -Cr improving -repeat bmet in AM   Hypernatremia . 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 -Continue amantadine, tegretol, lorazepam, trazodone -Resumed Plavix  -PT/OT with recs noted for SNF. Family had preferred d/c home instead   Normocytic iron deficiency anemia: Anemia panel consistent with iron deficiency.  Drop in Hgb partly dilutional. -IV ferric gluconate 250 mg daily x2 doses -hgb remains stable -Cont to  follow CBC trends     Essential hypertension: Normotensive. -Continue amlodipine, hydralazine, tamsulosin as able to     Hyperlipidemia - Monitor, not currently on therapy due to intolerance    Anxiety state -Continue lorazepam at home doses -Has not started hydroxyzine outpatient   BPH -Monitor UOP (I/Os) -Tamsulosin at home dose   Elevated troponin: Mild.  Likely delayed clearance   Leukocytosis/bandemia: Likely demargination versus infection.  Resolved.   Physical deconditioning: -Therapy recommended SNF, however plans for possible d/c home when stable  Hypomagnesemia -Replaced  Hypokalemia -Replaced  LUE swelling secondary to superficial DVT -Ordered and reviewed LUE doppler. Finding of superficial DVT -Cont with heat compress as needed   Body mass index is 25.1 kg/m.   DVT prophylaxis:  Place and maintain sequential compression device Start: 02/26/22 2141   Code Status: DNR/DNI Family Communication: Updated patient's wife at bedside Level of care: Med-Surg Status is: Inpatient The patient will remain inpatient because: Persistent postoperative ileus requiring NG tube and IV fluid       Subjective: Tired this AM. NG recently removed. Family reports pt only tolerating some limited PO  Physical Exam: Vitals:   03/05/22 0519 03/05/22 0947 03/05/22 2133 03/06/22 0921  BP: (!) 161/78 (!) 149/80 (!) 173/72 (!) 146/77  Pulse: 78 79 75 84  Resp: '15 18 16 18  '$ Temp: 98.6 F (37 C) 98 F (36.7 C) 99.6 F (37.6 C) 98.4 F (36.9 C)  TempSrc:   Oral Oral  SpO2: 96% 94% 95% 94%  Weight:      Height:       General exam: asleep, in no acute distress Respiratory system: normal chest rise, clear,  no audible wheezing Cardiovascular system: regular rhythm, s1-s2 Gastrointestinal system: Nondistended, nontender, pos BS Central nervous system: No seizures, no tremors Extremities: No cyanosis, no joint deformities Skin: No rashes, no pallor Psychiatry: Affect  normal // no auditory hallucinations    Data Reviewed:  Labs reviewed: K 13.7, Cr 1.36  Family Communication: Pt in room, family at bedside  Disposition: Status is: Inpatient Remains inpatient appropriate because: Severity of illness  Planned Discharge Destination: Home    Author: Marylu Lund, MD 03/06/2022 2:39 PM  For on call review [email or web address].

## 2022-03-07 DIAGNOSIS — K9189 Other postprocedural complications and disorders of digestive system: Secondary | ICD-10-CM | POA: Diagnosis not present

## 2022-03-07 DIAGNOSIS — K567 Ileus, unspecified: Secondary | ICD-10-CM | POA: Diagnosis not present

## 2022-03-07 DIAGNOSIS — K8050 Calculus of bile duct without cholangitis or cholecystitis without obstruction: Secondary | ICD-10-CM | POA: Diagnosis not present

## 2022-03-07 LAB — COMPREHENSIVE METABOLIC PANEL
ALT: 43 U/L (ref 0–44)
AST: 42 U/L — ABNORMAL HIGH (ref 15–41)
Albumin: 1.8 g/dL — ABNORMAL LOW (ref 3.5–5.0)
Alkaline Phosphatase: 129 U/L — ABNORMAL HIGH (ref 38–126)
Anion gap: 9 (ref 5–15)
BUN: 13 mg/dL (ref 8–23)
CO2: 23 mmol/L (ref 22–32)
Calcium: 7.7 mg/dL — ABNORMAL LOW (ref 8.9–10.3)
Chloride: 105 mmol/L (ref 98–111)
Creatinine, Ser: 1.38 mg/dL — ABNORMAL HIGH (ref 0.61–1.24)
GFR, Estimated: 52 mL/min — ABNORMAL LOW (ref >60)
Glucose, Bld: 99 mg/dL (ref 70–99)
Potassium: 3.4 mmol/L — ABNORMAL LOW (ref 3.5–5.1)
Sodium: 137 mmol/L (ref 135–145)
Total Bilirubin: 0.6 mg/dL (ref 0.3–1.2)
Total Protein: 4.7 g/dL — ABNORMAL LOW (ref 6.5–8.1)

## 2022-03-07 LAB — CBC
HCT: 24.5 % — ABNORMAL LOW (ref 39.0–52.0)
Hemoglobin: 8 g/dL — ABNORMAL LOW (ref 13.0–17.0)
MCH: 31.9 pg (ref 26.0–34.0)
MCHC: 32.7 g/dL (ref 30.0–36.0)
MCV: 97.6 fL (ref 80.0–100.0)
Platelets: 207 10*3/uL (ref 150–400)
RBC: 2.51 MIL/uL — ABNORMAL LOW (ref 4.22–5.81)
RDW: 12.9 % (ref 11.5–15.5)
WBC: 8.4 10*3/uL (ref 4.0–10.5)
nRBC: 0 % (ref 0.0–0.2)

## 2022-03-07 LAB — MAGNESIUM: Magnesium: 1.5 mg/dL — ABNORMAL LOW (ref 1.7–2.4)

## 2022-03-07 MED ORDER — POTASSIUM CHLORIDE CRYS ER 20 MEQ PO TBCR
60.0000 meq | EXTENDED_RELEASE_TABLET | Freq: Once | ORAL | Status: DC
  Filled 2022-03-07: qty 3

## 2022-03-07 MED ORDER — CLOPIDOGREL BISULFATE 75 MG PO TABS
75.0000 mg | ORAL_TABLET | Freq: Every day | ORAL | Status: DC
  Administered 2022-03-08 – 2022-03-12 (×5): 75 mg via ORAL
  Filled 2022-03-07 (×5): qty 1

## 2022-03-07 MED ORDER — AMANTADINE HCL 50 MG/5ML PO SOLN
100.0000 mg | Freq: Every day | ORAL | Status: DC
  Administered 2022-03-08 – 2022-03-12 (×5): 100 mg via ORAL
  Filled 2022-03-07 (×5): qty 10

## 2022-03-07 MED ORDER — CARBAMAZEPINE 100 MG/5ML PO SUSP
100.0000 mg | ORAL | Status: DC
  Administered 2022-03-08 – 2022-03-12 (×9): 100 mg via ORAL
  Filled 2022-03-07 (×13): qty 5

## 2022-03-07 MED ORDER — AMLODIPINE BESYLATE 5 MG PO TABS
5.0000 mg | ORAL_TABLET | Freq: Every day | ORAL | Status: DC
  Administered 2022-03-08 – 2022-03-11 (×4): 5 mg via ORAL
  Filled 2022-03-07 (×4): qty 1

## 2022-03-07 MED ORDER — PANTOPRAZOLE 2 MG/ML SUSPENSION
40.0000 mg | Freq: Every day | ORAL | Status: DC
  Administered 2022-03-08 – 2022-03-12 (×5): 40 mg via ORAL
  Filled 2022-03-07 (×6): qty 20

## 2022-03-07 MED ORDER — MAGNESIUM SULFATE 4 GM/100ML IV SOLN
4.0000 g | Freq: Once | INTRAVENOUS | Status: AC
  Administered 2022-03-07: 4 g via INTRAVENOUS
  Filled 2022-03-07: qty 100

## 2022-03-07 MED ORDER — TRAZODONE HCL 50 MG PO TABS
75.0000 mg | ORAL_TABLET | Freq: Every day | ORAL | Status: DC
  Administered 2022-03-07 – 2022-03-11 (×5): 75 mg via ORAL
  Filled 2022-03-07 (×5): qty 2

## 2022-03-07 MED ORDER — POTASSIUM CHLORIDE 10 MEQ/100ML IV SOLN
10.0000 meq | INTRAVENOUS | Status: AC
  Administered 2022-03-07 (×6): 10 meq via INTRAVENOUS
  Filled 2022-03-07 (×6): qty 100

## 2022-03-07 MED ORDER — CARBAMAZEPINE 100 MG/5ML PO SUSP
200.0000 mg | Freq: Every day | ORAL | Status: DC
  Administered 2022-03-07: 200 mg via ORAL
  Filled 2022-03-07 (×2): qty 10

## 2022-03-07 MED ORDER — FERROUS SULFATE 300 (60 FE) MG/5ML PO SYRP
300.0000 mg | ORAL_SOLUTION | Freq: Every day | ORAL | Status: DC
  Administered 2022-03-08 – 2022-03-12 (×5): 300 mg via ORAL
  Filled 2022-03-07 (×6): qty 5

## 2022-03-07 MED ORDER — POLYETHYLENE GLYCOL 3350 17 G PO PACK
17.0000 g | PACK | Freq: Every day | ORAL | Status: DC
  Administered 2022-03-07 – 2022-03-12 (×6): 17 g via ORAL
  Filled 2022-03-07 (×6): qty 1

## 2022-03-07 NOTE — Progress Notes (Signed)
Mobility Specialist Progress Note:   03/07/22 1704  Mobility  Activity Dangled on edge of bed  Level of Assistance +2 (takes two people)  Activity Response Tolerated well  $Mobility charge 1 Mobility   Pt received in bed and agreeable. Sat EOB and performed 10x marches and knee extensions on each side. No complaints or faults. Pt left in bed with all needs met, call bell in reach, and family in room.    Hillarie Harrigan Mobility Specialist-Acute Rehab Secure Chat only

## 2022-03-07 NOTE — Progress Notes (Signed)
Chief Complaint/Subjective: Tolerating liquids, +BM today, still some lower pain  Objective: Vital signs in last 24 hours: Temp:  [98.2 F (36.8 C)-98.5 F (36.9 C)] 98.5 F (36.9 C) (09/24 0928) Pulse Rate:  [72-95] 95 (09/24 0950) Resp:  [16-19] 18 (09/24 0928) BP: (148-158)/(73-78) 158/78 (09/24 0950) SpO2:  [94 %-95 %] 95 % (09/24 0928) Last BM Date : 03/06/22 Intake/Output from previous day: 09/23 0701 - 09/24 0700 In: 3154.8 [P.O.:680; I.V.:2474.8] Out: 2700 [Urine:2700] Intake/Output this shift: Total I/O In: 120 [P.O.:120] Out: -   PE: Gen: NAD Resp: nonlabored Card: RRR Abd: soft, slight distension  Lab Results:  Recent Labs    03/06/22 0739 03/07/22 0514  WBC 10.3 8.4  HGB 8.9* 8.0*  HCT 26.8* 24.5*  PLT 234 207   BMET Recent Labs    03/06/22 0739 03/07/22 0514  NA 137 137  K 3.5 3.4*  CL 105 105  CO2 20* 23  GLUCOSE 102* 99  BUN 12 13  CREATININE 1.36* 1.38*  CALCIUM 8.0* 7.7*   PT/INR No results for input(s): "LABPROT", "INR" in the last 72 hours. CMP     Component Value Date/Time   NA 137 03/07/2022 0514   NA 142 04/25/2019 1000   K 3.4 (L) 03/07/2022 0514   CL 105 03/07/2022 0514   CO2 23 03/07/2022 0514   GLUCOSE 99 03/07/2022 0514   BUN 13 03/07/2022 0514   BUN 30 (H) 04/25/2019 1000   CREATININE 1.38 (H) 03/07/2022 0514   CREATININE 1.71 (H) 12/25/2021 1433   CALCIUM 7.7 (L) 03/07/2022 0514   PROT 4.7 (L) 03/07/2022 0514   PROT 7.4 04/25/2019 1000   ALBUMIN 1.8 (L) 03/07/2022 0514   ALBUMIN 4.2 04/25/2019 1000   AST 42 (H) 03/07/2022 0514   ALT 43 03/07/2022 0514   ALKPHOS 129 (H) 03/07/2022 0514   BILITOT 0.6 03/07/2022 0514   BILITOT 0.3 04/25/2019 1000   GFRNONAA 52 (L) 03/07/2022 0514   GFRAA 34 (L) 04/25/2019 1000   Lipase     Component Value Date/Time   LIPASE 62 (H) 02/21/2022 0142    Studies/Results: [place] EKG SITE RITE  Result Date: 03/06/2022 If Site Rite image not attached, placement could not  be confirmed due to current cardiac rhythm.  VAS [place] UPPER EXTREMITY VENOUS DUPLEX  Result Date: 03/05/2022 UPPER VENOUS STUDY  Patient Name:  William [name]  Date of Exam:   03/05/2022 Medical Rec #: [id number]       Accession #:    [id number] Date of Birth: [birth year]       Patient Gender: M Patient Age:   81 years Exam Location:  [facility] Procedure:      VAS [place] UPPER EXTREMITY VENOUS DUPLEX Referring Phys: STEPHEN CHIU --------------------------------------------------------------------------------  Indications: Edema, and Swelling Comparison Study: no prior Performing Technologist: Archie Patten RVS  Examination Guidelines: A complete evaluation includes B-mode imaging, spectral Doppler, color Doppler, and power Doppler as needed of all accessible portions of each vessel. Bilateral testing is considered an integral part of a complete examination. Limited examinations for reoccurring indications may be performed as noted.  Right Findings: +----------+------------+---------+-----------+----------+-------+ RIGHT     CompressiblePhasicitySpontaneousPropertiesSummary +----------+------------+---------+-----------+----------+-------+ Subclavian               Yes       Yes                      +----------+------------+---------+-----------+----------+-------+  Left Findings: +----------+------------+---------+-----------+----------+--------------------+ LEFT  CompressiblePhasicitySpontaneousProperties      Summary        +----------+------------+---------+-----------+----------+--------------------+ IJV           Full       Yes       Yes                                   +----------+------------+---------+-----------+----------+--------------------+ Subclavian    Full       Yes       Yes                                   +----------+------------+---------+-----------+----------+--------------------+ Axillary      Full       Yes       Yes                                    +----------+------------+---------+-----------+----------+--------------------+ Brachial      Full       Yes       Yes                                   +----------+------------+---------+-----------+----------+--------------------+ Radial        Full                                                       +----------+------------+---------+-----------+----------+--------------------+ Ulnar         Full                                                       +----------+------------+---------+-----------+----------+--------------------+ Cephalic      None                                   mid-distal forearm                                                             level         +----------+------------+---------+-----------+----------+--------------------+ Basilic       Full                                                       +----------+------------+---------+-----------+----------+--------------------+  Summary:  Right: No evidence of thrombosis in the subclavian.  Left: No evidence of deep vein thrombosis in the upper extremity. Findings consistent with age indeterminate superficial vein thrombosis involving the left cephalic vein.  *See table(s) above for measurements and observations.  Diagnosing physician: Jamelle Haring Electronically signed by  Jamelle Haring on 03/05/2022 at 10:38:37 PM.    Final    DG Abd Portable 1V  Result Date: 03/05/2022 CLINICAL DATA:  Postoperative ileus EXAM: PORTABLE ABDOMEN - 1 VIEW COMPARISON:  Radiograph 03/02/2022 FINDINGS: Nasogastric tube tip and side port overlie the upper abdomen, likely distal stomach. Improvement in small bowel distension with mildly persistent colonic distension. Small bowel measures up to 3.3 cm, previously up to 5.2 cm. IMPRESSION: Nasogastric tube tip and side port overlie the right upper abdomen, likely distal stomach. Interval improvement in bowel distension. Electronically Signed   By: Maurine Simmering  M.D.   On: 03/05/2022 13:01    Anti-infectives: Anti-infectives (From admission, onward)    None       Assessment/Plan Ileus persists. Stay on clears until bowel function returns Choledocholithiasis POD#12 lap chole Dr. Thermon Leyland 9/12 -MRCP with cholelithiasis and multiple mid to distal CBD stones.  Pancreas grossly unremarkable in setting of elevated lipase -ERCP 9/11. Stones removed and sphincterotomy performed -Lap chole 9/12, discharged 9/14 -Readmit 9/15 with ileus - Having some bowel function but also high volume NG output, may be some water he is drinking but fluid in canister this morning is dark green - Will repeat abdominal film today - full liquids On day #3/3 of reglan. If unable to advance diet soon will need to consider PICC/TPN -mobilize, PT   FEN - full liquids VTE - Plavix ID - none currently needed   LOS: 8 days   I reviewed last 24 h vitals and pain scores, last 48 h intake and output, last 24 h labs and trends, and last 24 h imaging results.  This care required high  level of medical decision making.   [facility] 03/07/2022, 11:24 AM Please see Amion for pager number during day hours 7:00am-4:30pm or 7:00am -11:30am on weekends

## 2022-03-07 NOTE — Progress Notes (Signed)
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   CT abdomen and pelvis with fluid in esophagus, stomach and postoperative changes and concern for ileus.  Surgical team consulted.  Patient was started on IV fluid and eventually needed NG tube.   Ileus/partial obstruction seems to have resolved clinically and radiologically.  NG tube discontinued. Overnight on 9/19, pt again became symtomatic and required replacement of NG.  Assessment and Plan: Choledocholithiasis s/p ERCP on 9/11 and lap chole on 9/12.  Discharged on 9/14. Postoperative ileus: presents with emesis, presyncope and abdominal pain.  CT showed moderate fluid within distal esophagus and stomach.  Ileus/partial SBO seemed improved radiologically. -Surgery managing-NGT now removed as of 9/22 -Continue LR at 75 cc an hour -Per Surgery, cont to keep NG out. Diet advanced to fulls. If unable to further advance, then may need to consider TPN    AKI on CKD-3B: Baseline Cr 1.6-1.7.  AKI likely due to the above.  Resolved. -Continue basal IVF -Cr improving -repeat bmet in AM   Hypernatremia . 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 -Continue amantadine, tegretol, lorazepam, trazodone -Resumed Plavix  -PT/OT with recs noted for SNF. Family had preferred d/c home instead   Normocytic iron deficiency anemia: Anemia panel consistent with iron deficiency.  Drop in Hgb partly dilutional. -IV ferric gluconate 250 mg daily x2 doses -hgb remains  stable -Cont to follow CBC trends     Essential hypertension:  -Continue amlodipine, hydralazine, tamsulosin as able to  -BP stable    Hyperlipidemia - Monitor, not currently on therapy due to intolerance    Anxiety state -Continue lorazepam at home doses -Has not started hydroxyzine outpatient   BPH -Monitor UOP (I/Os) -Tamsulosin at home dose   Elevated troponin: Mild.  Likely delayed clearance   Leukocytosis/bandemia: Likely demargination versus infection.  Resolved.   Physical deconditioning: -Therapy recommended SNF, however plans for possible d/c home when stable  Hypomagnesemia -Replaced  Hypokalemia -Replaced -Recheck bmet in AM  LUE swelling secondary to superficial DVT -Ordered and reviewed LUE doppler. Finding of superficial DVT -Cont with heat compress as needed   Body mass index is 25.1 kg/m.   DVT prophylaxis:  Place and maintain sequential compression device Start: 02/26/22 2141   Code Status: DNR/DNI Family Communication: Updated patient's wife at bedside Level of care: Med-Surg Status is: Inpatient The patient will remain inpatient because: Persistent postoperative ileus requiring NG tube and IV fluid       Subjective: Reports feeling better today  Physical Exam: Vitals:   03/06/22 2117 03/07/22 0528 03/07/22 0928 03/07/22 0950  BP: (!) 156/74 (!) 150/73 (!) 151/78 (!) 158/78  Pulse: 72 77 79 95  Resp: '16 19 18   '$ Temp: 98.5 F (36.9 C) 98.2 F (36.8 C) 98.5 F (36.9 C)   TempSrc: Oral  Oral   SpO2: 94% 95% 95%   Weight:      Height:       General exam: Awake, laying in bed, in nad Respiratory system: Normal respiratory effort, no wheezing Cardiovascular system: regular rate,  s1, s2 Gastrointestinal system: Soft, nondistended, positive BS Central nervous system: CN2-12 grossly intact, strength intact Extremities: Perfused, no clubbing Skin: Normal skin turgor, no notable skin lesions seen Psychiatry: Mood normal // no visual  hallucinations   Data Reviewed:  Labs reviewed: K 3.4, Cr 1.38  Family Communication: Pt in room, family at bedside  Disposition: Status is: Inpatient Remains inpatient appropriate because: Severity of illness  Planned Discharge Destination: Home    Author: Marylu Lund, MD 03/07/2022 3:30 PM  For on call review [email or web address].

## 2022-03-08 ENCOUNTER — Ambulatory Visit: Payer: PPO | Admitting: Physical Therapy

## 2022-03-08 DIAGNOSIS — K9189 Other postprocedural complications and disorders of digestive system: Secondary | ICD-10-CM | POA: Diagnosis not present

## 2022-03-08 DIAGNOSIS — F419 Anxiety disorder, unspecified: Secondary | ICD-10-CM | POA: Diagnosis not present

## 2022-03-08 DIAGNOSIS — N401 Enlarged prostate with lower urinary tract symptoms: Secondary | ICD-10-CM | POA: Diagnosis not present

## 2022-03-08 DIAGNOSIS — Z66 Do not resuscitate: Secondary | ICD-10-CM | POA: Diagnosis not present

## 2022-03-08 LAB — COMPREHENSIVE METABOLIC PANEL
ALT: 40 U/L (ref 0–44)
AST: 34 U/L (ref 15–41)
Albumin: 1.8 g/dL — ABNORMAL LOW (ref 3.5–5.0)
Alkaline Phosphatase: 118 U/L (ref 38–126)
Anion gap: 9 (ref 5–15)
BUN: 9 mg/dL (ref 8–23)
CO2: 24 mmol/L (ref 22–32)
Calcium: 8.1 mg/dL — ABNORMAL LOW (ref 8.9–10.3)
Chloride: 104 mmol/L (ref 98–111)
Creatinine, Ser: 1.34 mg/dL — ABNORMAL HIGH (ref 0.61–1.24)
GFR, Estimated: 54 mL/min — ABNORMAL LOW (ref >60)
Glucose, Bld: 105 mg/dL — ABNORMAL HIGH (ref 70–99)
Potassium: 3.6 mmol/L (ref 3.5–5.1)
Sodium: 137 mmol/L (ref 135–145)
Total Bilirubin: 0.4 mg/dL (ref 0.3–1.2)
Total Protein: 4.9 g/dL — ABNORMAL LOW (ref 6.5–8.1)

## 2022-03-08 LAB — CBC
HCT: 23.8 % — ABNORMAL LOW (ref 39.0–52.0)
Hemoglobin: 7.7 g/dL — ABNORMAL LOW (ref 13.0–17.0)
MCH: 32 pg (ref 26.0–34.0)
MCHC: 32.4 g/dL (ref 30.0–36.0)
MCV: 98.8 fL (ref 80.0–100.0)
Platelets: 213 10*3/uL (ref 150–400)
RBC: 2.41 MIL/uL — ABNORMAL LOW (ref 4.22–5.81)
RDW: 13 % (ref 11.5–15.5)
WBC: 8.2 10*3/uL (ref 4.0–10.5)
nRBC: 0 % (ref 0.0–0.2)

## 2022-03-08 MED ORDER — PREDNISONE 50 MG PO TABS: 50.0000 mg | ORAL_TABLET | Freq: Every day | ORAL | Status: DC

## 2022-03-08 MED ORDER — CARBAMAZEPINE 100 MG/5ML PO SUSP
200.0000 mg | Freq: Every day | ORAL | Status: DC
  Administered 2022-03-08 – 2022-03-11 (×4): 200 mg via ORAL
  Filled 2022-03-08 (×6): qty 10

## 2022-03-08 NOTE — Progress Notes (Signed)
    Subjective: NGT out, 3 BMs, tolerating fulls ROS negative except as listed above. Objective: Vital signs in last 24 hours: Temp:  [98 F (36.7 C)-98.5 F (36.9 C)] 98 F (36.7 C) (09/25 0819) Pulse Rate:  [62-95] 68 (09/25 0819) Resp:  [16-18] 16 (09/25 0819) BP: (135-167)/(61-83) 147/83 (09/25 0819) SpO2:  [95 %-99 %] 97 % (09/25 0819) Last BM Date : 03/07/22  Intake/Output from previous day: 09/24 0701 - 09/25 0700 In: 2084.8 [P.O.:240; I.V.:1749.6; IV Piggyback:95.3] Out: 1450 [Urine:1450] Intake/Output this shift: No intake/output data recorded.  General appearance: alert GI: soft, incisions CDI, min dist, not sig tender  Lab Results: CBC  Recent Labs    03/07/22 0514 03/08/22 0439  WBC 8.4 8.2  HGB 8.0* 7.7*  HCT 24.5* 23.8*  PLT 207 213   BMET Recent Labs    03/07/22 0514 03/08/22 0439  NA 137 137  K 3.4* 3.6  CL 105 104  CO2 23 24  GLUCOSE 99 105*  BUN 13 9  CREATININE 1.38* 1.34*  CALCIUM 7.7* 8.1*   PT/INR No results for input(s): "LABPROT", "INR" in the last 72 hours. ABG No results for input(s): "PHART", "HCO3" in the last 72 hours.  Invalid input(s): "PCO2", "PO2"  Studies/Results: [place] EKG SITE RITE  Result Date: 03/06/2022 If Site Rite image not attached, placement could not be confirmed due to current cardiac rhythm.   Anti-infectives: Anti-infectives (From admission, onward)    None       Assessment/Plan: Choledocholithiasis S/P lap chole by Dr. Thermon Leyland 9/12 -MRCP with cholelithiasis and multiple mid to distal CBD stones.  Pancreas grossly unremarkable in setting of elevated lipase -ERCP 9/11. Stones removed and sphincterotomy performed -Lap chole 9/12, discharged 9/14 -Readmit 9/15 with ileus - 3 BMs and tolerating fulls - reg diet today. Will be able to D/C once tolerating this. -mobilize, PT   FEN - full liquids VTE - Plavix ID - none currently needed  I spoke with Dr. Wyline Copas and patient's family at the  bedside.  LOS: 9 days    Georganna Skeans, MD, MPH, FACS Trauma & General Surgery Use [email or web address] to contact on call provider  03/08/2022 Patient ID: William [name], male   DOB: 03-31-1941, 81 y.o.   MRN: [id number]

## 2022-03-08 NOTE — NC FL2 (Signed)
[place] MEDICAID FL2 LEVEL OF CARE SCREENING TOOL     IDENTIFICATION  Patient Name: William [name] Birthdate: 19-Jun-1940 Sex: male Admission Date (Current Location): 02/26/2022  [facility] and [place] Number:  Herbalist and Address:  [facility], [place] [address] [place],  [address]      Provider Number: [id number]  Attending Physician Name and Address:  Donne Hazel, MD  Relative Name and Phone Number:  Mansur, Patti (Spouse)   [phone]    Current Level of Care: Hospital Recommended Level of Care: [place] Prior Approval Number:    Date Approved/Denied:   PASRR Number: [id number]  Discharge Plan: SNF    Current Diagnoses: Patient Active Problem List   Diagnosis Date Noted   Postoperative ileus ([place]) 02/27/2022   Ileus, postoperative ([place]) 02/26/2022   Choledocholithiasis 02/19/2022   DNR (do not resuscitate) 02/19/2022   Left elbow pain 01/26/2022   Mid back pain on left side 01/26/2022   Rib pain 01/26/2022   Acute pain of left shoulder 11/12/2021   Leukocytes in urine 11/12/2021   Urinary frequency 11/12/2021   Thrush 10/08/2021   Hemiplegia, dominant side S/P CVA (cerebrovascular accident) ([place]) 09/11/2021   Insomnia    Prediabetes    Acute 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 ([place])    Basal ganglia infarction ([place]) 07/29/2021   Transaminitis 07/27/2021   UTI (urinary tract infection) 07/27/2021   CVA (cerebral vascular accident) ([place]) 07/27/2021   Fall 07/27/2021   Hyperglycemia 07/27/2021   Cholelithiasis 07/27/2021   Hypoxia 07/27/2021   Nausea and vomiting 24/58/0998   Acute metabolic encephalopathy 33/82/5053   Normocytic anemia 07/27/2021   Chronic back pain 07/27/2021   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[place]) 06/22/2021   Closed fracture of first lumbar vertebra with routine healing 02/03/2021   Closed fracture of multiple ribs  11/18/2020   Anxiety 01/29/2020   Leg pain, bilateral 01/29/2020   Ingrown toenail 07/13/2019   Lumbar spondylosis 05/02/2018   Pain in left knee 03/09/2018   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   Trochanteric bursitis of left hip 01/16/2018   Preventative health care 09/26/2017   HTN (hypertension) 07/19/2015   Hyperlipidemia 07/19/2015   Great toe pain 02/11/2014   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   Obesity (BMI 30-39.9) 06/25/2013   Renal insufficiency 06/25/2013   Weakness of left arm 06/25/2013   Sebaceous cyst 03/03/2011   Sprain of lumbar region 07/31/2010   Rib pain, left 08/29/2009   Carotid artery stenosis, asymptomatic, bilateral 05/02/2009   Eczema, atopic 05/31/2008   Vitamin D deficiency 03/01/2008   BPH (benign prostatic hyperplasia) 08/06/2007   Fasting hyperglycemia 12/21/2006   History of right MCA infarct 2006    Orientation RESPIRATION BLADDER Height & Weight     Self, Time, Situation, Place  Normal Incontinent Weight: 180 lb (81.6 kg) Height:  '5\' 11"'$  (180.3 cm)  BEHAVIORAL SYMPTOMS/MOOD NEUROLOGICAL BOWEL NUTRITION STATUS      Incontinent Diet (see d/c summary)  AMBULATORY STATUS COMMUNICATION OF NEEDS Skin   Total Care Verbally Surgical wounds (incision abdomen)                       Personal Care Assistance Level of Assistance  Bathing, Feeding, Dressing Bathing Assistance: Maximum assistance Feeding assistance: Limited assistance Dressing Assistance: Maximum assistance     Functional Limitations Info  Sight, Speech, Hearing Sight Info: Impaired Hearing Info: Adequate Speech  Info: Adequate    SPECIAL CARE FACTORS FREQUENCY  PT (By licensed PT), OT (By licensed OT)     PT Frequency: 5x/ week OT Frequency: 5x/ week            Contractures Contractures Info: Not present    Additional Factors Info  Code Status, Allergies Code Status Info: Full Allergies Info: Bee Venom   Strawberry Extract   Latex   Zetia  (Ezetimibe)   Adhesive (Tape)   Statins           Current Medications (03/08/2022):  This is the current hospital active medication list Current Facility-Administered Medications  Medication Dose Route Frequency Provider Last Rate Last Admin   acetaminophen (TYLENOL) tablet 650 mg  650 mg Oral Q6H PRN Sid Falcon, MD   650 mg at 02/26/22 2213   amantadine (SYMMETREL) 50 MG/5ML solution 100 mg  100 mg Oral Daily Donne Hazel, MD   100 mg at 03/08/22 9741   amLODipine (NORVASC) tablet 5 mg  5 mg Oral Daily Donne Hazel, MD   5 mg at 03/08/22 6384   bisacodyl (DULCOLAX) suppository 10 mg  10 mg Rectal Daily Meuth, Brooke A, PA-C   10 mg at 03/06/22 0920   carBAMazepine (TEGRETOL) 100 MG/5ML suspension 100 mg  100 mg Oral 2 times per day Donne Hazel, MD       carBAMazepine (TEGRETOL) 100 MG/5ML suspension 200 mg  200 mg Oral Q2200 Donne Hazel, MD   200 mg at 03/07/22 2149   Chlorhexidine Gluconate Cloth 2 % PADS 6 each  6 each Topical Daily Donne Hazel, MD   6 each at 03/08/22 0956   clopidogrel (PLAVIX) tablet 75 mg  75 mg Oral Daily Donne Hazel, MD   75 mg at 03/08/22 0821   enoxaparin (LOVENOX) injection 40 mg  40 mg Subcutaneous Q24H Donne Hazel, MD   40 mg at 03/08/22 5364   ferrous sulfate 300 (60 Fe) MG/5ML syrup 300 mg  300 mg Oral Q breakfast Donne Hazel, MD   300 mg at 03/08/22 6803   labetalol (NORMODYNE) injection 10 mg  10 mg Intravenous Q2H PRN Wendee Beavers T, MD   10 mg at 03/08/22 2122   lactated ringers infusion   Intravenous Continuous Donne Hazel, MD 75 mL/hr at 03/08/22 1154 New Bag at 03/08/22 1154   LORazepam (ATIVAN) injection 0.5 mg  0.5 mg Intravenous BID Wendee Beavers T, MD   0.5 mg at 03/08/22 4825   methocarbamol (ROBAXIN) 500 mg in dextrose 5 % 50 mL IVPB  500 mg Intravenous Q6H PRN Sid Falcon, MD       ondansetron [facility]) tablet 4 mg  4 mg Oral Q6H PRN Gilles Chiquito B, MD       Or   ondansetron [facility]) injection 4 mg  4 mg  Intravenous Q6H PRN Gilles Chiquito B, MD   4 mg at 03/02/22 1158   Oral care mouth rinse  15 mL Mouth Rinse 4 times per day Sid Falcon, MD   15 mL at 03/08/22 0856   Oral care mouth rinse  15 mL Mouth Rinse PRN Sid Falcon, MD       pantoprazole (PROTONIX) 2 mg/mL oral suspension 40 mg  40 mg Oral Daily Donne Hazel, MD   40 mg at 03/08/22 0037   polyethylene glycol (MIRALAX / GLYCOLAX) packet 17 g  17 g Oral Daily Kinsinger, Arta Bruce, MD  17 g at 03/08/22 0835   sodium chloride flush (NS) 0.9 % injection 10-40 mL  10-40 mL Intracatheter Q12H Donne Hazel, MD   10 mL at 03/07/22 2148   sodium chloride flush (NS) 0.9 % injection 10-40 mL  10-40 mL Intracatheter PRN Donne Hazel, MD   10 mL at 03/08/22 0444   tamsulosin (FLOMAX) capsule 0.4 mg  0.4 mg Oral QPC supper Gilles Chiquito B, MD   0.4 mg at 03/06/22 1719   traZODone (DESYREL) tablet 75 mg  75 mg Oral QHS Donne Hazel, MD   75 mg at 03/07/22 2129   Facility-Administered Medications Ordered in Other Encounters  Medication Dose Route Frequency Provider Last Rate Last Admin   gemcitabine (GEMZAR) chemo syringe for bladder instillation 2,000 mg  2,000 mg Bladder Instillation Once Franchot Gallo, MD         Discharge Medications: Please see discharge summary for a list of discharge medications.  Relevant Imaging Results:  Relevant Lab Results:   Additional Information SSN: [id number]; 5'11" 180lbs  Rudine Rieger F Jayleena Stille, LCSWA

## 2022-03-08 NOTE — Progress Notes (Signed)
Mobility Specialist Progress Note:   03/08/22 1301  Mobility  Activity Transferred from chair to bed  Level of Assistance +2 (takes two people) (ModA)  Activity Response Tolerated well  $Mobility charge 1 Mobility   Pt received in chair and agreeable. RN requesting assistance to transfer back to bed. No complaints. Pt left in bed with all needs met and RN in room.   Marleen Moret Mobility Specialist-Acute Rehab Secure Chat only

## 2022-03-08 NOTE — Progress Notes (Signed)
Physical Therapy Treatment Patient Details Name: William [name] MRN: [id number] DOB: [birth year] Today's Date: 03/08/2022   History of Present Illness 81 yo male [facility] after ERCP and lap chole with abdominal pain, emesis, presyncope and found to have ileus. NG tube placed 9/16.  PMHx:  stroke with L  hemiparesis, CKD3, HTN, vascular dementia, bladder cancer, choledocholithiasis s/p ERCP    PT Comments    Pt able to progress his mobility today, however continues to be limited in safe mobility by L clavicle/UE pain, and L sided weakness, and difficulty with command follow, in presence of generalized weakness and balance deficits. Pt requires increased cuing and time for task completion. Pt maxAx2 for  bed mobility,and modAx2 for stepping to recliner. Although wife is hoping for for AIR, PT continues to recommend SNF level rehab. PT will continue to follow acutely.    Recommendations for follow up therapy are one component of a multi-disciplinary discharge planning process, led by the attending physician.  Recommendations may be updated based on patient status, additional functional criteria and insurance authorization.  Follow Up Recommendations  Skilled nursing-short term rehab (<3 hours/day) Can patient physically be transported by private vehicle: No   Assistance Recommended at Discharge Frequent or constant Supervision/Assistance  Patient can return home with the following Direct supervision/assist for medications management;Direct supervision/assist for financial management;Assist for transportation;Help with stairs or ramp for entrance;Two people to help with walking and/or transfers;Two people to help with bathing/dressing/bathroom   Equipment Recommendations  None recommended by PT       Precautions / Restrictions Precautions Precautions: Fall Precaution Comments: L hemiparesis Restrictions Weight Bearing Restrictions: No Other Position/Activity Restrictions: per wife  has L clavicle fx, non-op     Mobility  Bed Mobility Overal bed mobility: Needs Assistance Bed Mobility: Rolling, Supine to Sit, Sit to Supine Rolling: Mod assist, Max assist Sidelying to sit: +2 for physical assistance, +2 for safety/equipment, HOB elevated, Max assist Supine to sit: HOB elevated, Max assist, +2 for physical assistance     General bed mobility comments: maxAx2 for achieving maintaining sitting EoB. increased posterior lean, able to repeat nose over toes however despite multimodal cuing has difficulty producing bend at the hips    Transfers Overall transfer level: Needs assistance Equipment used: 2 person hand held assist Transfers: Sit to/from Stand Sit to Stand: Mod assist, +2 physical assistance, From elevated surface   Step pivot transfers: +2 physical assistance, Mod assist       General transfer comment: modAx2 for coming to standing, steadying and then taking pivotal steps to recliner on his R          Balance Overall balance assessment: Needs assistance Sitting-balance support: Feet supported Sitting balance-Leahy Scale: Poor Sitting balance - Comments: min/ mod A to maintain sitting due to posterior lean, eventually able to maintain sitting balance without outside support Postural control: Posterior lean Standing balance support: Bilateral upper extremity supported Standing balance-Leahy Scale: Poor                              Cognition Arousal/Alertness: Awake/alert Behavior During Therapy: Flat affect, Impulsive Overall Cognitive Status: History of cognitive impairments - at baseline                                 General Comments: continues to need repetition, some perseverative moments  General Comments General comments (skin integrity, edema, etc.): wife present during session, expresses hope for AIR placement,      Pertinent Vitals/Pain Pain Assessment Pain Assessment: Faces Faces Pain  Scale: Hurts little more Pain Location: L arm with reaching Pain Descriptors / Indicators: Discomfort Pain Intervention(s): Limited activity within patient's tolerance, Monitored during session, Repositioned     PT Goals (current goals can now be found in the care plan section) Acute Rehab PT Goals PT Goal Formulation: With family Time For Goal Achievement: 03/20/22 Potential to Achieve Goals: Good Progress towards PT goals: Progressing toward goals    Frequency    Min 3X/week      PT Plan Current plan remains appropriate       AM-PAC PT "6 Clicks" Mobility   Outcome Measure  Help needed turning from your back to your side while in a flat bed without using bedrails?: A Lot Help needed moving from lying on your back to sitting on the side of a flat bed without using bedrails?: A Lot Help needed moving to and from a bed to a chair (including a wheelchair)?: Total Help needed standing up from a chair using your arms (e.g., wheelchair or bedside chair)?: Total Help needed to walk in hospital room?: Total Help needed climbing 3-5 steps with a railing? : Total 6 Click Score: 8    End of Session Equipment Utilized During Treatment: Gait belt Activity Tolerance: Patient tolerated treatment well Patient left: in bed;with nursing/sitter in room;with family/visitor present Nurse Communication: Mobility status PT Visit Diagnosis: Unsteadiness on feet (R26.81);Muscle weakness (generalized) (M62.81);Difficulty in walking, not elsewhere classified (R26.2);Hemiplegia and hemiparesis Hemiplegia - Right/Left: Left Hemiplegia - dominant/non-dominant: Non-dominant Hemiplegia - caused by: Nontraumatic intracerebral hemorrhage     Time: 1039-1110 PT Time Calculation (min) (ACUTE ONLY): 31 min  Charges:  $Therapeutic Activity: 23-37 mins                     Nadiya Pieratt B. Migdalia Dk PT, DPT Acute Rehabilitation Services Please use secure chat or  Call Office [phone]    [place] 03/08/2022, 12:28 PM

## 2022-03-08 NOTE — TOC Progression Note (Signed)
Transition of Care [facility]) - Initial/Assessment Note    Patient Details  Name: William [name] MRN: [id number] Date of Birth: [birth year]  Transition of Care [facility]) CM/SW Contact:    Milinda Antis, [place] Phone Number: 03/08/2022, 11:24 AM  Clinical Narrative:                 CSW spoke with the patient's spouse.  The spouse reports that she is open to knowing what SNFs in the area would be able to accept the patient.  CSW explained the insurance authorization process and will provide medicare nursing home compare list.  Patient's spouse has a preference for [facility] or [facility].  SNF referral will be sent out and bed offers given when they become available.    Expected Discharge Plan: [place] Barriers to Discharge: Continued Medical Work up   Patient Goals and CMS Choice Patient states their goals for this hospitalization and ongoing recovery are:: To return home CMS Medicare.gov Compare Post Acute Care list provided to:: Patient Choice offered to / list presented to : Patient, Spouse  Expected Discharge Plan and Services Expected Discharge Plan: [place]   Discharge Planning Services: CM Consult Post Acute Care Choice: [place] arrangements for the past 2 months: Single Family Home                 DME Arranged: N/A DME Agency: NA       HH Arranged: PT, OT (Custodial Services) [place] Agency: [place] ([place]) Date [place]: 03/02/22 Time [place]: 7412 Representative spoke with at [place]: [place] Arrangements/Services Living arrangements for the past 2 months: [place] with:: Spouse Patient language and need for interpreter reviewed:: Yes Do you feel safe going back to the place where you live?: Yes      Need for Family Participation in Patient Care: Yes (Comment) Care giver support system in place?: Yes (comment)   Criminal Activity/Legal Involvement  Pertinent to Current Situation/Hospitalization: No - Comment as needed  Activities of Daily Living Home Assistive Devices/Equipment: None ADL Screening (condition at time of admission) Patient's cognitive ability adequate to safely complete daily activities?: No Is the patient deaf or have difficulty hearing?: No Does the patient have difficulty seeing, even when wearing glasses/contacts?: No Does the patient have difficulty concentrating, remembering, or making decisions?: Yes Patient able to express need for assistance with ADLs?: Yes Does the patient have difficulty dressing or bathing?: Yes Independently performs ADLs?: No Does the patient have difficulty walking or climbing stairs?: Yes Weakness of Legs: Both Weakness of Arms/Hands: Left  Permission Sought/Granted Permission sought to share information with : Case Manager, Customer service manager, Family Supports Permission granted to share information with : Yes, Verbal Permission Granted              Emotional Assessment Appearance:: Appears stated age Attitude/Demeanor/Rapport: Gracious Michela Pitcher thank you.) Affect (typically observed): Accepting Orientation: : Oriented to Self Alcohol / Substance Use: Not Applicable Psych Involvement: No (comment)  Admission diagnosis:  Ileus, postoperative ([place]) [K91.89, K56.7] Ileus following gastrointestinal surgery ([place]) [K91.89, K56.7] Near syncope [R55] Postoperative ileus ([place]) [K91.89, K56.7] Patient Active Problem List   Diagnosis Date Noted   Postoperative ileus ([place]) 02/27/2022   Ileus, postoperative ([place]) 02/26/2022   Choledocholithiasis 02/19/2022   DNR (do not resuscitate) 02/19/2022   Left elbow pain 01/26/2022   Mid back pain on left side 01/26/2022  Rib pain 01/26/2022   Acute pain of left shoulder 11/12/2021   Leukocytes in urine 11/12/2021   Urinary frequency 11/12/2021   Thrush 10/08/2021   Hemiplegia, dominant side S/P CVA (cerebrovascular accident)  ([place]) 09/11/2021   Insomnia    Prediabetes    Acute 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 ([place])    Basal ganglia infarction ([place]) 07/29/2021   Transaminitis 07/27/2021   UTI (urinary tract infection) 07/27/2021   CVA (cerebral vascular accident) ([place]) 07/27/2021   Fall 07/27/2021   Hyperglycemia 07/27/2021   Cholelithiasis 07/27/2021   Hypoxia 07/27/2021   Nausea and vomiting 34/74/2595   Acute metabolic encephalopathy 63/87/5643   Normocytic anemia 07/27/2021   Chronic back pain 07/27/2021   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[place]) 06/22/2021   Closed fracture of first lumbar vertebra with routine healing 02/03/2021   Closed fracture of multiple ribs 11/18/2020   Anxiety 01/29/2020   Leg pain, bilateral 01/29/2020   Ingrown toenail 07/13/2019   Lumbar spondylosis 05/02/2018   Pain in left knee 03/09/2018   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   Trochanteric bursitis of left hip 01/16/2018   Preventative health care 09/26/2017   HTN (hypertension) 07/19/2015   Hyperlipidemia 07/19/2015   Great toe pain 02/11/2014   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   Obesity (BMI 30-39.9) 06/25/2013   Renal insufficiency 06/25/2013   Weakness of left arm 06/25/2013   Sebaceous cyst 03/03/2011   Sprain of lumbar region 07/31/2010   Rib pain, left 08/29/2009   Carotid artery stenosis, asymptomatic, bilateral 05/02/2009   Eczema, atopic 05/31/2008   Vitamin D deficiency 03/01/2008   BPH (benign prostatic hyperplasia) 08/06/2007   Fasting hyperglycemia 12/21/2006   History of right MCA infarct 2006   PCP:  Ann Held, DO Pharmacy:   [facility] [name] [place] [address] Phone: [phone] Fax: [facility] [address] [place] [address] Phone: [phone] Fax:  [phone]  [facility] [name] [address] Phone: [phone] Fax: [phone]  [id number] [place] [place]Phone: [phone]: [phone] [id number] [address] [place]: [phone]: [phone]   Social Determinants of Health (SDOH) Interventions    Readmission Risk Interventions    02/23/2022    1:27 PM  Readmission Risk Prevention Plan  Transportation Screening Complete  PCP or Specialist Appt within 3-5 Days Complete  HRI or [place]  Social Work Consult for [place]/Counseling Complete  Palliative Care Screening Complete  Medication Review (RPress photographerComplete

## 2022-03-08 NOTE — Progress Notes (Signed)
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   CT abdomen and pelvis with fluid in esophagus, stomach and postoperative changes and concern for ileus.  Surgical team consulted.  Patient was started on IV fluid and eventually needed NG tube.   Ileus/partial obstruction seems to have resolved clinically and radiologically.  NG tube discontinued. Overnight on 9/19, pt again became symtomatic and required replacement of NG.  Assessment and Plan: Choledocholithiasis s/p ERCP on 9/11 and lap chole on 9/12.  Discharged on 9/14. Postoperative ileus: presents with emesis, presyncope and abdominal pain.  CT showed moderate fluid within distal esophagus and stomach.  Ileus/partial SBO seemed improved radiologically. -Surgery managing-NGT now removed as of 9/22 -Per Surgery, cont to keep NG out. Diet advanced per Surgery    AKI on CKD-3B: Baseline Cr 1.6-1.7.  AKI likely due to the above.  Resolved. -Continue basal IVF -Cr improved to 1.34 today -repeat bmet in AM   Hypernatremia . 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 -Continue amantadine, tegretol, lorazepam, trazodone -Resumed Plavix  -PT/OT with recs noted for SNF. Family had preferred d/c home instead, however seems unsure about SNFespecially with degree of pt's generalized weakness   Normocytic iron deficiency anemia: Anemia panel consistent with iron deficiency. -IV ferric gluconate 250 mg daily x2 doses -hgb remains stable -Cont to follow CBC  trends     Essential hypertension:  -Continue amlodipine, hydralazine, tamsulosin as able to  -BP stable    Hyperlipidemia - Monitor, not currently on therapy due to intolerance    Anxiety state -Continue lorazepam at home doses -Has not started hydroxyzine outpatient   BPH -Monitor UOP (I/Os) -Tamsulosin at home dose   Elevated troponin: Mild at presentation, Without chest pains    Leukocytosis/bandemia: Likely demargination versus infection.  Resolved.   Physical deconditioning: -Therapy recommended SNF, however plans for possible d/c home when stable  Hypomagnesemia -Replaced  Hypokalemia -Replaced -Recheck bmet in AM  LUE swelling secondary to superficial DVT -Ordered and reviewed LUE doppler. Finding of superficial DVT -Cont with heat compress as needed   Body mass index is 25.1 kg/m.   DVT prophylaxis:  Place and maintain sequential compression device Start: 02/26/22 2141   Code Status: DNR/DNI Family Communication: Updated patient's wife at bedside Level of care: Med-Surg Status is: Inpatient The patient will remain inpatient because: Severity of illness       Subjective: Without complaints, tolerating full liquid  Physical Exam: Vitals:   03/07/22 2053 03/08/22 0515 03/08/22 0712 03/08/22 0819  BP: (!) 141/77 (!) 167/79 135/68 (!) 147/83  Pulse: 70 71 62 68  Resp: '16 16  16  '$ Temp: 98.4 F (36.9 C) 98.5 F (36.9 C)  98 F (36.7 C)  TempSrc: Oral Oral  Oral  SpO2: 98% 99%  97%  Weight:      Height:       General exam: Conversant, in no acute distress Respiratory system: normal chest rise, clear, no audible wheezing Cardiovascular system: regular rhythm, s1-s2 Gastrointestinal system: Nondistended, nontender, pos BS Central nervous system: No seizures, no  tremors Extremities: No cyanosis, no joint deformities Skin: No rashes, no pallor Psychiatry: Affect normal // no auditory hallucinations   Data Reviewed:  Labs reviewed: K 3.6, Cr  1.34  Family Communication: Pt in room, family at bedside  Disposition: Status is: Inpatient Remains inpatient appropriate because: Severity of illness  Planned Discharge Destination: Home    Author: Marylu Lund, MD 03/08/2022 4:23 PM  For on call review [email or web address].

## 2022-03-09 ENCOUNTER — Ambulatory Visit: Payer: PPO | Admitting: Podiatry

## 2022-03-09 DIAGNOSIS — K9189 Other postprocedural complications and disorders of digestive system: Secondary | ICD-10-CM | POA: Diagnosis not present

## 2022-03-09 DIAGNOSIS — F419 Anxiety disorder, unspecified: Secondary | ICD-10-CM | POA: Diagnosis not present

## 2022-03-09 DIAGNOSIS — K8050 Calculus of bile duct without cholangitis or cholecystitis without obstruction: Secondary | ICD-10-CM | POA: Diagnosis not present

## 2022-03-09 DIAGNOSIS — K567 Ileus, unspecified: Secondary | ICD-10-CM | POA: Diagnosis not present

## 2022-03-09 LAB — COMPREHENSIVE METABOLIC PANEL
ALT: 39 U/L (ref 0–44)
AST: 32 U/L (ref 15–41)
Albumin: 1.8 g/dL — ABNORMAL LOW (ref 3.5–5.0)
Alkaline Phosphatase: 117 U/L (ref 38–126)
Anion gap: 3 — ABNORMAL LOW (ref 5–15)
BUN: 5 mg/dL — ABNORMAL LOW (ref 8–23)
CO2: 23 mmol/L (ref 22–32)
Calcium: 7.4 mg/dL — ABNORMAL LOW (ref 8.9–10.3)
Chloride: 109 mmol/L (ref 98–111)
Creatinine, Ser: 1.29 mg/dL — ABNORMAL HIGH (ref 0.61–1.24)
GFR, Estimated: 56 mL/min — ABNORMAL LOW (ref >60)
Glucose, Bld: 118 mg/dL — ABNORMAL HIGH (ref 70–99)
Potassium: 3.7 mmol/L (ref 3.5–5.1)
Sodium: 135 mmol/L (ref 135–145)
Total Bilirubin: 0.4 mg/dL (ref 0.3–1.2)
Total Protein: 4.7 g/dL — ABNORMAL LOW (ref 6.5–8.1)

## 2022-03-09 LAB — CBC
HCT: 23 % — ABNORMAL LOW (ref 39.0–52.0)
Hemoglobin: 7.4 g/dL — ABNORMAL LOW (ref 13.0–17.0)
MCH: 32.2 pg (ref 26.0–34.0)
MCHC: 32.2 g/dL (ref 30.0–36.0)
MCV: 100 fL (ref 80.0–100.0)
Platelets: 185 10*3/uL (ref 150–400)
RBC: 2.3 MIL/uL — ABNORMAL LOW (ref 4.22–5.81)
RDW: 13.3 % (ref 11.5–15.5)
WBC: 7.5 10*3/uL (ref 4.0–10.5)
nRBC: 0 % (ref 0.0–0.2)

## 2022-03-09 NOTE — Progress Notes (Signed)
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   CT abdomen and pelvis with fluid in esophagus, stomach and postoperative changes and concern for ileus.  Surgical team consulted.  Patient was started on IV fluid and eventually needed NG tube.   Ileus/partial obstruction seems to have resolved clinically and radiologically.  NG tube discontinued. Overnight on 9/19, pt again became symtomatic and required replacement of NG.  Assessment and Plan: Choledocholithiasis s/p ERCP on 9/11 and lap chole on 9/12.  Discharged on 9/14. Postoperative ileus: presents with emesis, presyncope and abdominal pain.  CT showed moderate fluid within distal esophagus and stomach.  Ileus/partial SBO seemed improved radiologically. -Surgery managing-NGT now removed as of 9/22, diet advanced to dysphagia 3 -Pt reportedly had difficulty swallowing chicken yesterday, SLP consulted    AKI on CKD-3B: Baseline Cr 1.6-1.7.  AKI likely due to the above.  Resolved. -Continue basal IVF -Cr improved to 129 today -recheck bmet in AM   Hypernatremia . 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 -Continue amantadine, tegretol, lorazepam, trazodone -Resumed Plavix  -PT/OT with recs noted for SNF. Family had preferred d/c home instead, however seems unsure about SNFespecially with degree of pt's generalized weakness -CIR also consulted per family request, would f/u on dispo recs   Normocytic iron deficiency anemia: Anemia panel consistent with  iron deficiency. -IV ferric gluconate 250 mg daily x2 doses -hgb remains stable -Cont to follow CBC trends     Essential hypertension:  -Continue amlodipine, hydralazine, tamsulosin as able to  -BP stable    Hyperlipidemia - Monitor, not currently on therapy due to intolerance    Anxiety state -Continue lorazepam at home doses -Has not started hydroxyzine outpatient   BPH -Monitor UOP (I/Os) -Tamsulosin at home dose   Elevated troponin: Mild at presentation, Without chest pains    Leukocytosis/bandemia: Likely demargination versus infection.  Resolved.   Physical deconditioning: -Therapy recommended SNF, however plans for possible d/c home when stable  Hypomagnesemia -Replaced  Hypokalemia -Normalized -Recheck bmet in AM  LUE swelling secondary to superficial DVT -Ordered and reviewed LUE doppler. Finding of superficial DVT -Cont with heat compress as needed   Body mass index is 25.1 kg/m.   DVT prophylaxis:  Place and maintain sequential compression device Start: 02/26/22 2141   Code Status: DNR/DNI Family Communication: Updated patient's wife at bedside Level of care: Med-Surg Status is: Inpatient The patient will remain inpatient because: Severity of illness       Subjective: Without complaints this AM  Physical Exam: Vitals:   03/08/22 2051 03/09/22 0611 03/09/22 0623 03/09/22 0948  BP: (!) 140/71  136/70 139/67  Pulse: 67 72 67 73  Resp: '18 16 18 18  '$ Temp: 98.6 F (37 C) 98.3 F (36.8 C) (!) 97.5 F (36.4 C) 97.8 F (36.6 C)  TempSrc: Oral Oral Oral Oral  SpO2: 96%  96% 95%  Weight:      Height:       General exam: Awake, laying in bed, in nad Respiratory system: Normal respiratory effort, no wheezing Cardiovascular system: regular  rate, s1, s2 Gastrointestinal system: Soft, nondistended, positive BS Central nervous system: CN2-12 grossly intact, strength intact Extremities: Perfused, no clubbing Skin: Normal skin turgor, no notable  skin lesions seen Psychiatry: Mood normal // no visual hallucinations   Data Reviewed:  Labs reviewed: K 3.7, Cr 1.29   Family Communication: Pt in room, family at bedside  Disposition: Status is: Inpatient Remains inpatient appropriate because: Severity of illness  Planned Discharge Destination: Home    Author: Marylu Lund, MD 03/09/2022 1:10 PM  For on call review [email or web address].

## 2022-03-09 NOTE — Progress Notes (Signed)
Occupational Therapy Treatment Patient Details Name: William [name] MRN: [id number] DOB: 1941/03/27 Today's Date: 03/09/2022   History of present illness 81 yo male returns [facility] after ERCP and lap chole with abdominal pain, emesis, presyncope and found to have ileus. NG tube placed 9/16.  PMHx:  stroke with L  hemiparesis, CKD3, HTN, vascular dementia, bladder cancer, choledocholithiasis s/p ERCP   OT comments  Pt progressing towards established OT goals. Pt performing bed mobility with max A + 2 and toilet transfer with mod A +2. Limited by cognition and L hemiparesis. Requiring total A +2 for posterior pericare in standing this session. Pt benefits from max multimodal cues for seuencing of transfers and bed mobility. Pt presents with decreased balance, L side strength, cognition, and safety. Continue to recommend discharge at SNF for continued OT to optimize safety and independence in ADL and IADL.    Recommendations for follow up therapy are one component of a multi-disciplinary discharge planning process, led by the attending physician.  Recommendations may be updated based on patient status, additional functional criteria and insurance authorization.    Follow Up Recommendations  Skilled nursing-short term rehab (<3 hours/day)    Assistance Recommended at Discharge Frequent or constant Supervision/Assistance  Patient can return home with the following  A lot of help with walking and/or transfers;A lot of help with bathing/dressing/bathroom;Assistance with cooking/housework;Direct supervision/assist for medications management;Direct supervision/assist for financial management;Assist for transportation;Help with stairs or ramp for entrance   Equipment Recommendations  None recommended by OT;Other (comment) (Pt has recommended DME)    Recommendations for Other Services PT consult    Precautions / Restrictions Precautions Precautions: Fall Precaution Comments: L  hemiparesis Restrictions Weight Bearing Restrictions: No Other Position/Activity Restrictions: per wife has L clavicle dislocation several weeks ago, non-op; was in sling 1 week       Mobility Bed Mobility Overal bed mobility: Needs Assistance Bed Mobility: Supine to Sit, Sit to Supine     Supine to sit: HOB elevated, Max assist, +2 for physical assistance Sit to supine: Max assist   General bed mobility comments: maxAx2 for achieving sitting EoB. Min guard-min A at EOB. Intermittent mod A, but able to correct with cueing.    Transfers Overall transfer level: Needs assistance Equipment used: Rolling walker (2 wheels) Transfers: Sit to/from Stand, Bed to chair/wheelchair/BSC Sit to Stand: Max assist, +2 physical assistance, +2 safety/equipment     Step pivot transfers: +2 physical assistance, Mod assist     General transfer comment: modAx2 for coming to standing, steadying and then taking pivotal steps to recliner on his R. Mod+2 for initial stand; max up from [facility]. Mod cues for sequencing transfer and multimodal cues and assist for facilitating anterior lean to achieve nose over toes     Balance Overall balance assessment: Needs assistance Sitting-balance support: Feet supported Sitting balance-Leahy Scale: Poor Sitting balance - Comments: Min A for sitting balance Postural control: Left lateral lean, Posterior lean Standing balance support: Bilateral upper extremity supported Standing balance-Leahy Scale: Poor Standing balance comment: Reliant on UE and external support                           ADL either performed or assessed with clinical judgement   ADL Overall ADL's : Needs assistance/impaired                 Upper Body Dressing : Moderate assistance;Sitting Upper Body Dressing Details (indicate cue type and reason): to  don Buyer, retail: +2 for physical assistance;+2 for safety/equipment;Maximal  assistance;Stand-pivot;BSC/3in1;Rolling walker (2 wheels) Toilet Transfer Details (indicate cue type and reason): Pt reportig need to toilet on arrival. Performing transfer with max A +2 for rise. Mod A +2 for taking steps toward R; Max A taking steps towards L Toileting- Clothing Manipulation and Hygiene: +2 for physical assistance;+2 for safety/equipment;Total assistance;Sit to/from stand Toileting - Clothing Manipulation Details (indicate cue type and reason): nurse tech performing pericare in standing. Some decreased awareness, pt did not know he was already beginning to go on standing     Functional mobility during ADLs: Moderate assistance;+2 for physical assistance;+2 for safety/equipment;Rolling walker (2 wheels);Cueing for sequencing (Pivoting to [facility]) General ADL Comments: session focused on activity tolerance and toileting to meet pt's current needs    Extremity/Trunk Assessment Upper Extremity Assessment Upper Extremity Assessment: Generalized weakness;LUE deficits/detail;RUE deficits/detail RUE Deficits / Details: generalized weakness LUE Deficits / Details: dislocated L clavicle, proximal vs distal weakness from prior CVA. Poor coordination L hand LUE Coordination: decreased gross motor;decreased fine motor   Lower Extremity Assessment Lower Extremity Assessment: Defer to PT evaluation        Vision   Additional Comments: Potentially decreased peripheral vision   Perception     Praxis Praxis Praxis: Impaired Praxis Impairment Details: Motor planning;Initiation    Cognition Arousal/Alertness: Awake/alert Behavior During Therapy: Flat affect Overall Cognitive Status: History of cognitive impairments - at baseline                                 General Comments: requires repetition, and multimodal cues. Pt with decreased awareness, reporting " I am moving to the edge of the bed" despite not moving. Pt with fluctuating mood state and ability to reason  throughout. Following direct simple commands with incresaed time. Cues for seuquencing of transfers        Exercises      Shoulder Instructions       General Comments Wife present and very thankful for therapy this date    Pertinent Vitals/ Pain       Pain Assessment Pain Assessment: Faces Faces Pain Scale: Hurts even more Pain Location: L shoulder Pain Descriptors / Indicators: Discomfort Pain Intervention(s): Limited activity within patient's tolerance, Monitored during session, Repositioned  Home Living                                          Prior Functioning/Environment              Frequency  Min 2X/week        Progress Toward Goals  OT Goals(current goals can now be found in the care plan section)  Progress towards OT goals: Progressing toward goals  Acute Rehab OT Goals Patient Stated Goal: poop OT Goal Formulation: With patient Time For Goal Achievement: 03/14/22 Potential to Achieve Goals: Fair ADL Goals Pt Will Perform Grooming: with min guard assist;sitting;standing Pt Will Perform Upper Body Dressing: with min guard assist;sitting;standing Pt Will Perform Lower Body Dressing: with min guard assist;sitting/lateral leans;sit to/from stand Pt Will Transfer to Toilet: with min assist;stand pivot transfer;squat pivot transfer;bedside commode Additional ADL Goal #1: pt will complete bed mobility min A in prep for ADLs  Plan Discharge plan remains appropriate    Co-evaluation  AM-PAC OT "6 Clicks" Daily Activity     Outcome Measure   Help from another person eating meals?: A Little Help from another person taking care of personal grooming?: A Little Help from another person toileting, which includes using toliet, bedpan, or urinal?: A Lot Help from another person bathing (including washing, rinsing, drying)?: A Lot Help from another person to put on and taking off regular upper body clothing?: A Lot Help  from another person to put on and taking off regular lower body clothing?: Total 6 Click Score: 13    End of Session Equipment Utilized During Treatment: Gait belt;Rolling walker (2 wheels)  OT Visit Diagnosis: Unsteadiness on feet (R26.81);Other abnormalities of gait and mobility (R26.89);Muscle weakness (generalized) (M62.81)   Activity Tolerance Patient tolerated treatment well   Patient Left in bed;with call bell/phone within reach;with nursing/sitter in room;with family/visitor present (chair position)   Nurse Communication Mobility status        Time: 7943-2761 OT Time Calculation (min): 45 min  Charges: OT General Charges $OT Visit: 1 Visit OT Treatments $Self Care/Home Management : 38-52 mins  Shanda Howells, OTR/L [facility] Acute Rehabilitation Office: [phone]   Lula Olszewski 03/09/2022, 5:09 PM

## 2022-03-09 NOTE — TOC Progression Note (Signed)
Transition of Care [facility]) - Initial/Assessment Note    Patient Details  Name: William [name] MRN: [id number] Date of Birth: 03/13/1941  Transition of Care [facility]) CM/SW Contact:    Milinda Antis, [place] Phone Number: 03/09/2022, 3:14 PM  Clinical Narrative:                 CSW met with the patient's spouse to present bed offers.  The spouse will review the list and inform CSW once a facility is chosen.    Expected Discharge Plan: [place] Barriers to Discharge: Continued Medical Work up   Patient Goals and CMS Choice Patient states their goals for this hospitalization and ongoing recovery are:: To return home CMS Medicare.gov Compare Post Acute Care list provided to:: Patient Choice offered to / list presented to : Patient, Spouse  Expected Discharge Plan and Services Expected Discharge Plan: [place]   Discharge Planning Services: CM Consult Post Acute Care Choice: [place] arrangements for the past 2 months: Single Family Home                 DME Arranged: N/A DME Agency: NA       HH Arranged: PT, OT (Custodial Services) [place] Agency: [place] ([place]) Date [place]: 03/02/22 Time [place]: 4098 Representative spoke with at [place]: [place] Arrangements/Services Living arrangements for the past 2 months: [place] with:: Spouse Patient language and need for interpreter reviewed:: Yes Do you feel safe going back to the place where you live?: Yes      Need for Family Participation in Patient Care: Yes (Comment) Care giver support system in place?: Yes (comment)   Criminal Activity/Legal Involvement Pertinent to Current Situation/Hospitalization: No - Comment as needed  Activities of Daily Living Home Assistive Devices/Equipment: None ADL Screening (condition at time of admission) Patient's cognitive ability adequate to safely complete daily activities?:  No Is the patient deaf or have difficulty hearing?: No Does the patient have difficulty seeing, even when wearing glasses/contacts?: No Does the patient have difficulty concentrating, remembering, or making decisions?: Yes Patient able to express need for assistance with ADLs?: Yes Does the patient have difficulty dressing or bathing?: Yes Independently performs ADLs?: No Does the patient have difficulty walking or climbing stairs?: Yes Weakness of Legs: Both Weakness of Arms/Hands: Left  Permission Sought/Granted Permission sought to share information with : Case Manager, Customer service manager, Family Supports Permission granted to share information with : Yes, Verbal Permission Granted              Emotional Assessment Appearance:: Appears stated age Attitude/Demeanor/Rapport: Gracious Michela Pitcher thank you.) Affect (typically observed): Accepting Orientation: : Oriented to Self Alcohol / Substance Use: Not Applicable Psych Involvement: No (comment)  Admission diagnosis:  Ileus, postoperative ([place]) [K91.89, K56.7] Ileus following gastrointestinal surgery ([place]) [K91.89, K56.7] Near syncope [R55] Postoperative ileus ([place]) [K91.89, K56.7] Patient Active Problem List   Diagnosis Date Noted   Postoperative ileus ([place]) 02/27/2022   Ileus, postoperative ([place]) 02/26/2022   Choledocholithiasis 02/19/2022   DNR (do not resuscitate) 02/19/2022   Left elbow pain 01/26/2022   Mid back pain on left side 01/26/2022   Rib pain 01/26/2022   Acute pain of left shoulder 11/12/2021   Leukocytes in urine 11/12/2021   Urinary frequency 11/12/2021   Thrush 10/08/2021   Hemiplegia, dominant side S/P CVA (cerebrovascular accident) ([place]) 09/11/2021   Insomnia    Prediabetes  Acute 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 ([place])    Basal ganglia infarction ([place]) 07/29/2021   Transaminitis 07/27/2021   UTI (urinary tract infection) 07/27/2021   CVA (cerebral  vascular accident) ([place]) 07/27/2021   Fall 07/27/2021   Hyperglycemia 07/27/2021   Cholelithiasis 07/27/2021   Hypoxia 07/27/2021   Nausea and vomiting 83/29/1916   Acute metabolic encephalopathy 60/60/0459   Normocytic anemia 07/27/2021   Chronic back pain 07/27/2021   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[place]) 06/22/2021   Closed fracture of first lumbar vertebra with routine healing 02/03/2021   Closed fracture of multiple ribs 11/18/2020   Anxiety 01/29/2020   Leg pain, bilateral 01/29/2020   Ingrown toenail 07/13/2019   Lumbar spondylosis 05/02/2018   Pain in left knee 03/09/2018   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   Trochanteric bursitis of left hip 01/16/2018   Preventative health care 09/26/2017   HTN (hypertension) 07/19/2015   Hyperlipidemia 07/19/2015   Great toe pain 02/11/2014   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   Obesity (BMI 30-39.9) 06/25/2013   Renal insufficiency 06/25/2013   Weakness of left arm 06/25/2013   Sebaceous cyst 03/03/2011   Sprain of lumbar region 07/31/2010   Rib pain, left 08/29/2009   Carotid artery stenosis, asymptomatic, bilateral 05/02/2009   Eczema, 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 [name] [address] Phone: [phone] Fax: [phone]  [id number] [place], [place] [address] [name] [address] Phone: [phone]  Fax: [phone]  [id number] - [place], [place] [address] [name] [address] Phone: [phone] Fax: [phone]     Social Determinants of Health (SDOH) Interventions    Readmission Risk Interventions    02/23/2022    1:27 PM  Readmission Risk Prevention Plan  Transportation Screening Complete  PCP or Specialist Appt within 3-5 Days Complete  HRI or [place] Complete  Social Work Consult for [place] Planning/Counseling Complete  Palliative Care Screening Complete  Medication Review Press photographer) Complete

## 2022-03-09 NOTE — Progress Notes (Signed)
Speech Language Pathology Treatment: Dysphagia  Patient Details Name: William [name] MRN: [id number] DOB: 01/21/1941 Today's Date: 03/09/2022 Time: 4373-5789 SLP Time Calculation (min) (ACUTE ONLY): 15 min  Assessment / Plan / Recommendation Clinical Impression  Patient seen by SLP for skilled treatment session focused on dysphagia goals. SLP observed patient with Dys 3 solids, thin liquids meal tray with spouse present for education. He was able to feed himself using utensils after spouse set up tray and provided verbal cues to redirect his attention. Patient tolerated puree solids and soft cooked carrots without significant difficulty but with chopped meat, he exhibited prolonged mastication and eventually needing to spit out. (Spouse reported he does this at baseline) No overt s/s aspiration or penetration observed. SLP reviewed MBS films from April 2023 and discussed/reviewed with spouse. SLP does not suspect that patient's swallow function has changed significantly since MBS and in addition, from spouse's report, she has been very careful and diligent with monitoring patient's PO intake and safety. SLP recommending to continue with Dys 3 solids,, thin liquids and did suggest spouse bring in some foods from home to compare his toleration with that of the hospital foods. SLP provided spouse with printed information on IDDSI diet consistencies. No further SLP intervention recommended at this time.    HPI HPI: Patient is a 81 y.o.  male with PMH:  stroke with L  hemiparesis, CKD3, HTN, vascular dementia, bladder cancer, choledocholithiasis s/p ERCP, dysphagia who returned to [facility] after ERCP and lap chole with abdominal pain, emesis, presyncope and found to have ileus. NG tube placed 9/16 after CT abdomen showed fluid in esophagus, stomach and concern for ileus. It was removed on 9/19 but had to be replaced overnight and was removed again on 9/22.  PMHx:  stroke with L  hemiparesis, CKD3, HTN, vascular  dementia, bladder cancer, choledocholithiasis s/p ERCP. ON 03/08/22, SLP swallow evaluation ordered after wife reported choking incident with a piece of chicken.      SLP Plan  All goals met      Recommendations for follow up therapy are one component of a multi-disciplinary discharge planning process, led by the attending physician.  Recommendations may be updated based on patient status, additional functional criteria and insurance authorization.    Recommendations  Diet recommendations: Dysphagia 3 (mechanical soft);Thin liquid;Dysphagia 2 (fine chop) Liquids provided via: Cup;Straw Medication Administration: Whole meds with puree Supervision: Patient able to self feed;Full supervision/cueing for compensatory strategies;Staff to assist with self feeding Compensations: Minimize environmental distractions;Slow rate;Small sips/bites Postural Changes and/or Swallow Maneuvers: Seated upright 90 degrees                Oral Care Recommendations: Oral care BID Follow Up Recommendations: Follow physician's recommendations for discharge plan and follow up therapies Assistance recommended at discharge: Frequent or constant Supervision/Assistance SLP Visit Diagnosis: Dysphagia, unspecified (R13.10) Plan: All goals met         Sonia Baller, MA, CCC-SLP Speech Therapy

## 2022-03-09 NOTE — Evaluation (Signed)
Clinical/Bedside Swallow Evaluation Patient Details  Name: William [name] MRN: [id number] Date of Birth: 01/18/1941  Today's Date: 03/09/2022 Time: SLP Start Time (ACUTE ONLY): 50 SLP Stop Time (ACUTE ONLY): 1600 SLP Time Calculation (min) (ACUTE ONLY): 20 min  Past Medical History:  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   HTN (hypertension) 07/19/2015   Hyperlipidemia    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   Mild, related to stroke history   Nocturia    Renal insufficiency 06/25/2013   S/P carotid endarterectomy    BILATERAL ICA--  PATENT PER DUPLEX  05-19-2012   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   Right calf   Urinary frequency    Vitamin D deficiency    Past Surgical History:  Past Surgical History:  Procedure Laterality Date   APPENDECTOMY  AS CHILD   CARDIOVASCULAR STRESS TEST  03-27-2012  DR CRENSHAW   LOW RISK LEXISCAN STUDY-- PROBABLE NORMAL PERFUSION AND SOFT TISSUE ATTENUATION/  NO ISCHEMIA/ EF 51%   CAROTID ENDARTERECTOMY Bilateral LEFT  11-12-2008  DR GREG HAYES   RIGHT ICA  2006  ([facility])   CHOLECYSTECTOMY N/A 02/23/2022   Procedure: LAPAROSCOPIC CHOLECYSTECTOMY;  Surgeon: Felicie Morn, MD;  Location: [place];  Service: General;  Laterality: N/A;   CYSTOSCOPY W/ RETROGRADES Bilateral 06/22/2021   Procedure: CYSTOSCOPY WITH RETROGRADE PYELOGRAM;  Surgeon: Franchot Gallo, MD;  Location: [place];  Service: Urology;  Laterality: Bilateral;   CYSTOSCOPY WITH LITHOLAPAXY N/A 02/26/2013   Procedure: CYSTOSCOPY  WITH LITHOLAPAXY;  Surgeon: Franchot Gallo, MD;  Location: [facility];  Service: Urology;  Laterality: N/A;   ENDOSCOPIC RETROGRADE CHOLANGIOPANCREATOGRAPHY (ERCP) WITH PROPOFOL N/A 02/22/2022   Procedure: ENDOSCOPIC RETROGRADE CHOLANGIOPANCREATOGRAPHY (ERCP) WITH PROPOFOL;  Surgeon: Carol Ada, MD;  Location: [place];  Service: Gastroenterology;  Laterality: N/A;   EYE SURGERY  Jan. 2016   cataract surgery both eyes   INGUINAL HERNIA REPAIR Right 11-08-2006   IR KYPHO EA ADDL LEVEL THORACIC OR LUMBAR  02/12/2021   IR RADIOLOGIST EVAL & MGMT  02/18/2021   MASS EXCISION N/A 03/03/2016   Procedure: EXCISION OF BACK  MASS;  Surgeon: Stark Klein, MD;  Location: [place];  Service: General;  Laterality: N/A;   MOHS SURGERY Left 1/ 2016   Dr Nevada Crane-- Basal cell   PROSTATE SURGERY     REMOVAL OF STONES  02/22/2022   Procedure: REMOVAL OF STONES;  Surgeon: Carol Ada, MD;  Location: [facility] ENDOSCOPY;  Service: Gastroenterology;;   [name]  02/22/2022   Procedure: [name];  Surgeon: Carol Ada, MD;  Location: [facility] ENDOSCOPY;  Service: Gastroenterology;;   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-C N/A 06/22/2021   Procedure: TRANSURETHRAL RESECTION OF BLADDER TUMOR;  Surgeon: Franchot Gallo, MD;  Location: [facility];  Service: Urology;  Laterality: N/A;   TRANSURETHRAL RESECTION OF PROSTATE N/A 02/26/2013   Procedure: TRANSURETHRAL RESECTION OF THE PROSTATE WITH GYRUS INSTRUMENTS;  Surgeon: Franchot Gallo, MD;  Location: [facility];  Service: Urology;  Laterality: N/A;   TRANSURETHRAL RESECTION OF PROSTATE N/A 06/22/2021   Procedure: TRANSURETHRAL RESECTION OF THE PROSTATE (TURP);  Surgeon: Franchot Gallo, MD;  Location[facility];  Service: Urology;  Laterality: N/A;   HPI:  Patient is a 81 y.o.  male with PMH:  stroke with L  hemiparesis, CKD3, HTN, vascular dementia, bladder  cancer, choledocholithiasis s/p ERCP, dysphagia who returned to [facility] after ERCP and lap chole with abdominal pain, emesis, presyncope and found to have ileus. NG tube placed 9/16 after CT abdomen showed fluid in esophagus, stomach and concern for ileus. It was removed on 9/19 but had to be replaced overnight and was removed again on 9/22.  PMHx:  stroke with L  hemiparesis, CKD3, HTN, vascular dementia, bladder cancer, choledocholithiasis s/p ERCP. ON 03/08/22, SLP swallow evaluation ordered after wife reported choking incident with a piece of chicken.    Assessment / Plan / Recommendation  Clinical Impression  Patient presenting with clinical s/s of dysphagia as per this bedside/clinical swallow evaluation however likely at or near baseline as patient has h/o CVA and dysphagia. During today's assessment, patient exhibited suspected swallow initiation delay with liquids but no overt s/s aspiration or penetration. He tolerated puree solids but with Dys 2 solid, patient with prolonged mastication and he continued to chew on the side of his mouth when only very small pieces of unmasticated pineapple were present. SLP discussed with patient's spouse and plan is for SLP to observe patient at a meal time but not make any changes in PO textures at this time (patient on Dys 3 (mechanical soft solids) and thin liquids as spouse is very careful and understanding of patient's dysphagia and safety precautions. SLP Visit Diagnosis: Dysphagia, unspecified (R13.10)    Aspiration Risk  Mild aspiration risk    Diet Recommendation Dysphagia 3 (Mech soft);Thin liquid   Liquid Administration via: Cup;Straw Medication Administration: Whole meds with puree Supervision: Full supervision/cueing for compensatory strategies;Staff to assist with self feeding Compensations: Minimize environmental distractions;Slow rate;Small sips/bites Postural Changes: Seated upright at 90 degrees    Other  Recommendations Oral Care  Recommendations: Oral care BID    Recommendations for follow up therapy are one component of a multi-disciplinary discharge planning process, led by the attending physician.  Recommendations may be updated based on patient status, additional functional criteria and insurance authorization.  Follow up Recommendations Follow physician's recommendations for discharge plan and follow up therapies      Assistance Recommended at Discharge Frequent or constant Supervision/Assistance  Functional Status Assessment Patient has had a recent decline in their functional status and demonstrates the ability to make significant improvements in function in a reasonable and predictable amount of time.  Frequency and Duration min 1 x/week  1 week       Prognosis Prognosis for Safe Diet Advancement: Good      Swallow Study   General Date of Onset: 03/08/22 HPI: Patient is a 81 y.o.  male with PMH:  stroke with L  hemiparesis, CKD3, HTN, vascular dementia, bladder cancer, choledocholithiasis s/p ERCP, dysphagia who returned to [facility] after ERCP and lap chole with abdominal pain, emesis, presyncope and found to have ileus. NG tube placed 9/16 after CT abdomen showed fluid in esophagus, stomach and concern for ileus. It was removed on 9/19 but had to be replaced overnight and was removed again on 9/22.  PMHx:  stroke with L  hemiparesis, CKD3, HTN, vascular dementia, bladder cancer, choledocholithiasis s/p ERCP. ON 03/08/22, SLP  swallow evaluation ordered after wife reported choking incident with a piece of chicken. Type of Study: Bedside Swallow Evaluation Previous Swallow Assessment: 10/01/21 MBS and was seen by OP SLP for dysphagia with last treatment session 12/01/2021 Diet Prior to this Study: Dysphagia 3 (soft);Thin liquids Temperature Spikes Noted: No Respiratory Status: Room air History of Recent Intubation: No Behavior/Cognition: Alert;Cooperative;Pleasant mood Oral Cavity Assessment: Within Functional  Limits Oral Care Completed by SLP: No Oral Cavity - Dentition: Missing dentition Self-Feeding Abilities: Needs assist;Needs set up Patient Positioning: Upright in bed Baseline Vocal Quality: Low vocal intensity Volitional Swallow: Unable to elicit    Oral/Motor/Sensory Function Overall Oral Motor/Sensory Function: Within functional limits   Ice Chips     Thin Liquid Thin Liquid: Impaired Pharyngeal  Phase Impairments: Suspected delayed Swallow    Nectar Thick     Honey Thick     Puree Puree: Within functional limits Presentation: Spoon   Solid     Solid: Impaired Oral Phase Functional Implications: Prolonged oral transit;Impaired mastication      Sonia Baller, MA, CCC-SLP Speech Therapy

## 2022-03-09 NOTE — Progress Notes (Addendum)
    Subjective: Tolerated some solids yesterday but, per wife, he did have an episode of choking on some chicken. Reports he has had issues with swallowing in the past, usually because he puts too much in his mouth at once. States on previous speech eval he was told his food should be "minced and moist".   Otherwise he denies nausea, vomiting, belching. He is having flatus. No BM today.   Objective: Vital signs in last 24 hours: Temp:  [97.5 F (36.4 C)-98.6 F (37 C)] 97.5 F (36.4 C) (09/26 0623) Pulse Rate:  [67-72] 67 (09/26 0623) Resp:  [16-18] 18 (09/26 0623) BP: (136-153)/(66-71) 136/70 (09/26 0623) SpO2:  [96 %-98 %] 96 % (09/26 0623) Last BM Date : 03/07/22  Intake/Output from previous day: 09/25 0701 - 09/26 0700 In: 1318 [P.O.:420; I.V.:898] Out: 1700 [Urine:1700] Intake/Output this shift: Total I/O In: 220 [P.O.:220] Out: -   General appearance: alert GI: soft, incisions CDI, min dist, not sig tender  Lab Results: CBC  Recent Labs    03/08/22 0439 03/09/22 0348  WBC 8.2 7.5  HGB 7.7* 7.4*  HCT 23.8* 23.0*  PLT 213 185   BMET Recent Labs    03/08/22 0439 03/09/22 0348  NA 137 135  K 3.6 3.7  CL 104 109  CO2 24 23  GLUCOSE 105* 118*  BUN 9 5*  CREATININE 1.34* 1.29*  CALCIUM 8.1* 7.4*   PT/INR No results for input(s): "LABPROT", "INR" in the last 72 hours. ABG No results for input(s): "PHART", "HCO3" in the last 72 hours.  Invalid input(s): "PCO2", "PO2"  Studies/Results: No results found.  Anti-infectives: Anti-infectives (From admission, onward)    None       Assessment/Plan: Choledocholithiasis S/P lap chole by Dr. Thermon Leyland 9/12 -MRCP with cholelithiasis and multiple mid to distal CBD stones.  Pancreas grossly unremarkable in setting of elevated lipase -ERCP 9/11. Stones removed and sphincterotomy performed -Lap chole 9/12, discharged 9/14 -Readmit 9/15 with ileus - Now having bowel function and tolerating PO - SLP  eval for dysphagia pending. SNF Placement pending. Stable for discharge from surgical perspective. CCS will sign off please call as needed. -mobilize, PT   FEN - DYS 3, per SLP VTE - Plavix ID - none currently needed  I spoke with Dr. Wyline Copas and patient's family at the bedside.  LOS: 10 days   Obie Dredge, PA-C Trauma & General Surgery Use [email or web address] to contact on call provider  03/09/2022 Patient ID: William [name], male   DOB: 01-15-1941, 81 y.o.   MRN: [id number]

## 2022-03-09 NOTE — Plan of Care (Signed)
  Problem: Nutrition: Goal: Adequate nutrition will be maintained Outcome: Not Progressing   Problem: Elimination: Goal: Will not experience complications related to bowel motility Outcome: Not Progressing   Problem: Safety: Goal: Ability to remain free from injury will improve Outcome: Not Progressing   Problem: Skin Integrity: Goal: Risk for impaired skin integrity will decrease Outcome: Not Progressing

## 2022-03-10 ENCOUNTER — Ambulatory Visit: Payer: PPO | Admitting: Physical Therapy

## 2022-03-10 DIAGNOSIS — K9189 Other postprocedural complications and disorders of digestive system: Secondary | ICD-10-CM | POA: Diagnosis not present

## 2022-03-10 DIAGNOSIS — K567 Ileus, unspecified: Secondary | ICD-10-CM | POA: Diagnosis not present

## 2022-03-10 LAB — COMPREHENSIVE METABOLIC PANEL
ALT: 32 U/L (ref 0–44)
AST: 24 U/L (ref 15–41)
Albumin: 1.8 g/dL — ABNORMAL LOW (ref 3.5–5.0)
Alkaline Phosphatase: 116 U/L (ref 38–126)
Anion gap: 3 — ABNORMAL LOW (ref 5–15)
BUN: 5 mg/dL — ABNORMAL LOW (ref 8–23)
CO2: 25 mmol/L (ref 22–32)
Calcium: 7.6 mg/dL — ABNORMAL LOW (ref 8.9–10.3)
Chloride: 109 mmol/L (ref 98–111)
Creatinine, Ser: 1.25 mg/dL — ABNORMAL HIGH (ref 0.61–1.24)
GFR, Estimated: 59 mL/min — ABNORMAL LOW (ref >60)
Glucose, Bld: 103 mg/dL — ABNORMAL HIGH (ref 70–99)
Potassium: 3.7 mmol/L (ref 3.5–5.1)
Sodium: 137 mmol/L (ref 135–145)
Total Bilirubin: 0.3 mg/dL (ref 0.3–1.2)
Total Protein: 4.6 g/dL — ABNORMAL LOW (ref 6.5–8.1)

## 2022-03-10 LAB — CBC
HCT: 22.2 % — ABNORMAL LOW (ref 39.0–52.0)
Hemoglobin: 7.2 g/dL — ABNORMAL LOW (ref 13.0–17.0)
MCH: 32 pg (ref 26.0–34.0)
MCHC: 32.4 g/dL (ref 30.0–36.0)
MCV: 98.7 fL (ref 80.0–100.0)
Platelets: 201 10*3/uL (ref 150–400)
RBC: 2.25 MIL/uL — ABNORMAL LOW (ref 4.22–5.81)
RDW: 13.4 % (ref 11.5–15.5)
WBC: 6 10*3/uL (ref 4.0–10.5)
nRBC: 0 % (ref 0.0–0.2)

## 2022-03-10 NOTE — TOC Progression Note (Signed)
Transition of Care [facility]) - Initial/Assessment Note    Patient Details  Name: William [name] MRN: [id number] Date of Birth: [birth year]  Transition of [facility]) CM/SW Contact:    Milinda Antis, LCSWA Phone Number: 03/10/2022, 10:37 AM  Clinical Narrative:                 CSW met with the patient's wife at bedside.  The family has chosen [employer].  CSW contacted [facility] and left a secure VM for Herma Ard requesting insurance authorization for the patient and is awaiting a returned call.    Expected Discharge Plan: [place] Barriers to Discharge: Continued Medical Work up   Patient Goals and CMS Choice Patient states their goals for this hospitalization and ongoing recovery are:: To return home CMS Medicare.gov Compare Post Acute Care list provided to:: Patient Choice offered to / list presented to : Patient, Spouse  Expected Discharge Plan and Services Expected Discharge Plan: [place]   Discharge Planning Services: CM Consult Post Acute Care Choice: [place] arrangements for the past 2 months: Single Family Home                 DME Arranged: N/A DME Agency: NA       HH Arranged: PT, OT (Custodial Services) [place] Agency: [place] ([place]) Date [place]: 03/02/22 Time [place]: 0076 Representative spoke with at [place]: [place] Arrangements/Services Living arrangements for the past 2 months: [place] with:: Spouse Patient language and need for interpreter reviewed:: Yes Do you feel safe going back to the place where you live?: Yes      Need for Family Participation in Patient Care: Yes (Comment) Care giver support system in place?: Yes (comment)   Criminal Activity/Legal Involvement Pertinent to Current Situation/Hospitalization: No - Comment as needed  Activities of Daily Living Home Assistive Devices/Equipment: None ADL Screening (condition at time  of admission) Patient's cognitive ability adequate to safely complete daily activities?: No Is the patient deaf or have difficulty hearing?: No Does the patient have difficulty seeing, even when wearing glasses/contacts?: No Does the patient have difficulty concentrating, remembering, or making decisions?: Yes Patient able to express need for assistance with ADLs?: Yes Does the patient have difficulty dressing or bathing?: Yes Independently performs ADLs?: No Does the patient have difficulty walking or climbing stairs?: Yes Weakness of Legs: Both Weakness of Arms/Hands: Left  Permission Sought/Granted Permission sought to share information with : Case Manager, Customer service manager, Family Supports Permission granted to share information with : Yes, Verbal Permission Granted              Emotional Assessment Appearance:: Appears stated age Attitude/Demeanor/Rapport: Gracious Michela Pitcher thank you.) Affect (typically observed): Accepting Orientation: : Oriented to Self Alcohol / Substance Use: Not Applicable Psych Involvement: No (comment)  Admission diagnosis:  Ileus, postoperative ([place]) [K91.89, K56.7] Ileus following gastrointestinal surgery ([place]) [K91.89, K56.7] Near syncope [R55] Postoperative ileus ([place]) [K91.89, K56.7] Patient Active Problem List   Diagnosis Date Noted   Postoperative ileus ([place]) 02/27/2022   Ileus, postoperative ([place]) 02/26/2022   Choledocholithiasis 02/19/2022   DNR (do not resuscitate) 02/19/2022   Left elbow pain 01/26/2022   Mid back pain on left side 01/26/2022   Rib pain 01/26/2022   Acute pain of left shoulder 11/12/2021   Leukocytes in urine 11/12/2021   Urinary frequency 11/12/2021   Thrush 10/08/2021   Hemiplegia, dominant  side S/P CVA (cerebrovascular accident) ([place]) 09/11/2021   Insomnia    Prediabetes    Acute 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 ([place])    Basal ganglia infarction ([place]) 07/29/2021    Transaminitis 07/27/2021   UTI (urinary tract infection) 07/27/2021   CVA (cerebral vascular accident) ([place]) 07/27/2021   Fall 07/27/2021   Hyperglycemia 07/27/2021   Cholelithiasis 07/27/2021   Hypoxia 07/27/2021   Nausea and vomiting 29/56/2130   Acute metabolic encephalopathy 86/57/8469   Normocytic anemia 07/27/2021   Chronic back pain 07/27/2021   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[place]) 06/22/2021   Closed fracture of first lumbar vertebra with routine healing 02/03/2021   Closed fracture of multiple ribs 11/18/2020   Anxiety 01/29/2020   Leg pain, bilateral 01/29/2020   Ingrown toenail 07/13/2019   Lumbar spondylosis 05/02/2018   Pain in left knee 03/09/2018   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   Trochanteric bursitis of left hip 01/16/2018   Preventative health care 09/26/2017   HTN (hypertension) 07/19/2015   Hyperlipidemia 07/19/2015   Great toe pain 02/11/2014   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   Obesity (BMI 30-39.9) 06/25/2013   Renal insufficiency 06/25/2013   Weakness of left arm 06/25/2013   Sebaceous cyst 03/03/2011   Sprain of lumbar region 07/31/2010   Rib pain, left 08/29/2009   Carotid artery stenosis, asymptomatic, bilateral 05/02/2009   Eczema, atopic 05/31/2008   Vitamin D deficiency 03/01/2008   BPH (benign prostatic hyperplasia) 08/06/2007   Fasting hyperglycemia 12/21/2006   History of right MCA infarct 2006   PCP:  Ann Held, DO Pharmacy:   [facility] [address], [place] [address] Phone: [phone] Fax: [place] [address], [place] [address] Phone: [phone] Fax: [phone]  [facility] [address] [name] [address] Phone: [phone] Fax: [phone]  [id number] [place], [place] [address] [name] [address] Phone: [phone] Fax: [phone]  [id number] [place], [place] [address] [name] [address] Phone: [phone] Fax: [phone]     Social Determinants of Health (SDOH) Interventions    Readmission Risk Interventions    02/23/2022    1:27 PM  Readmission Risk Prevention Plan  Transportation Screening Complete  PCP or Specialist Appt within 3-5 Days Complete  HRI or [place] Complete  Social Work Consult for [place] Planning/Counseling Complete  Palliative Care Screening Complete  Medication Review Press photographer) Complete

## 2022-03-10 NOTE — Progress Notes (Signed)
Inpatient Rehab Admissions Coordinator:    CIR consult received. Unfortunately, pt. Does not appear to tolerate the intensity of CIR, PT/OT are not recommending SNF, and insurance will not approve CIR for Pt.'s diagnoses. He is ultimately not a candidate for CIR at this time. I spoke with pt.'s daughter and explained this to her and she stated understanding. She is amenable to SNF.  Clemens Catholic, [place], [place] Admissions Coordinator  [phone] ([place]) [phone] (office)

## 2022-03-10 NOTE — Progress Notes (Signed)
Physical Therapy Treatment Patient Details Name: William [name] MRN: [id number] DOB: September 09, 1940 Today's Date: 03/10/2022   History of Present Illness 81 yo male returns to [facility] after ERCP and lap chole with abdominal pain, emesis, presyncope and found to have ileus. NG tube placed 9/16.  PMHx:  stroke with L  hemiparesis, CKD3, HTN, vascular dementia, bladder cancer, choledocholithiasis s/p ERCP    PT Comments    Patient is easily distracted and requires multi-modal cues to follow commands. He was able to stand twice from EOB (+2 mod assist due to left lean) and ultimately take pivotal steps to his right from bed to chair.    Recommendations for follow up therapy are one component of a multi-disciplinary discharge planning process, led by the attending physician.  Recommendations may be updated based on patient status, additional functional criteria and insurance authorization.  Follow Up Recommendations  Skilled nursing-short term rehab (<3 hours/day) Can patient physically be transported by private vehicle: No   Assistance Recommended at Discharge Frequent or constant Supervision/Assistance  Patient can return home with the following Direct supervision/assist for medications management;Direct supervision/assist for financial management;Assist for transportation;Help with stairs or ramp for entrance;Two people to help with walking and/or transfers;Two people to help with bathing/dressing/bathroom   Equipment Recommendations  None recommended by PT    Recommendations for Other Services       Precautions / Restrictions Precautions Precautions: Fall Precaution Comments: L hemiparesis Restrictions Weight Bearing Restrictions: No Other Position/Activity Restrictions: per wife has L clavicle dislocation several weeks ago, non-op; was in sling 1 week     Mobility  Bed Mobility Overal bed mobility: Needs Assistance Bed Mobility: Rolling, Sidelying to Sit Rolling: Max assist Sidelying  to sit: +2 for physical assistance, Mod assist       General bed mobility comments: rolling to right with incr assist due to left shoulder/UE pain; assist to take legs off bed and to raise torso to sit with pt use RUE to assist (and rail)    Transfers Overall transfer level: Needs assistance Equipment used: 2 person hand held assist Transfers: Sit to/from Stand, Bed to chair/wheelchair/BSC Sit to Stand: +2 physical assistance, +2 safety/equipment, Mod assist   Step pivot transfers: +2 physical assistance, Mod assist       General transfer comment: modAx2 for coming to standing, repeated twice; steadying (pt with left lean) and then taking pivotal steps to recliner on his R.    Ambulation/Gait                   Stairs             Wheelchair Mobility    Modified Rankin (Stroke Patients Only) Modified Rankin (Stroke Patients Only) Pre-Morbid Rankin Score: Moderately severe disability Modified Rankin: Severe disability     Balance Overall balance assessment: Needs assistance Sitting-balance support: Feet supported Sitting balance-Leahy Scale: Poor Sitting balance - Comments: Min A for sitting balance Postural control: Left lateral lean, Posterior lean Standing balance support: Bilateral upper extremity supported Standing balance-Leahy Scale: Poor Standing balance comment: Reliant on UE and external support                            Cognition Arousal/Alertness: Awake/alert Behavior During Therapy: Flat affect Overall Cognitive Status: History of cognitive impairments - at baseline  General Comments: requires repetition, and multimodal cues. Easily distracted. Pt with decreased awareness, reporting " I am moving to the edge of the bed" despite not moving.  Following direct simple commands with incresaed time. Cues for seuquencing of transfers        Exercises General Exercises - Lower  Extremity Ankle Circles/Pumps: AROM, Both, 10 reps Long Arc Quad: AROM, Both, Seated, 20 reps Hip Flexion/Marching: AROM, Both, 20 reps, Seated    General Comments General comments (skin integrity, edema, etc.): Wife present and supportive.      Pertinent Vitals/Pain Pain Assessment Pain Assessment: Faces Faces Pain Scale: Hurts little more Pain Location: L shoulder Pain Descriptors / Indicators: Discomfort Pain Intervention(s): Limited activity within patient's tolerance, Monitored during session    Home Living                          Prior Function            PT Goals (current goals can now be found in the care plan section) Acute Rehab PT Goals Patient Stated Goal: none stated PT Goal Formulation: With family Time For Goal Achievement: 03/20/22 Potential to Achieve Goals: Good Progress towards PT goals: Progressing toward goals    Frequency    Min 3X/week      PT Plan Current plan remains appropriate    Co-evaluation              AM-PAC PT "6 Clicks" Mobility   Outcome Measure  Help needed turning from your back to your side while in a flat bed without using bedrails?: A Lot Help needed moving from lying on your back to sitting on the side of a flat bed without using bedrails?: A Lot Help needed moving to and from a bed to a chair (including a wheelchair)?: Total Help needed standing up from a chair using your arms (e.g., wheelchair or bedside chair)?: Total Help needed to walk in hospital room?: Total Help needed climbing 3-5 steps with a railing? 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

## 2022-03-10 NOTE — Progress Notes (Signed)
PROGRESS NOTE   William [name]  LHT:[id number] DOB: January 05, 1941 DOA: 02/26/2022 PCP: Ann Held, DO  Brief Narrative:  81 year old white male Known HTN HLD CVA residual left-sided hemiplegia + carotid stenosis status post bilateral CEA [first 2006]-mild vascular dementia CKD 4 BPH bladder cancer status post TURP Reflux Recent hospitalization 9/8-9/14 2023 nausea and vomiting found to have acute cholecystitis choledocholithiasis with underlying CP 9/11 lap chole 9/12 and was cleared for discharge  Return with emesis and presyncope and was admitted for ileus-eventually needed NG tube and this had to be replaced despite improvement back on 9/19--finally NG was d/c 9/22 and patient now on dys 3 diet Speech therapy is following  Hospital-Problem based course  Ileus status post recent Recent lap chole Ileus is now resolved patient is on dysphagia diet--- monitor trends --patient is passing flatus, stool Appreciate general surgery follow-up Continue dysphagia 3 diet--- needs supplements given low albumin Tramadol from prior to admission has been held  AKI superimposed on CKD 3B-baseline creatinine 1.6 on arrival creatinine was 2.2  mild metabolic acidosis from AKI Hypernatremia on admission of 147 Discontinue LR 75/H--repeat labs a.m. Hypokalemia is also normalized  CVA residual left-sided hemiplegia CEA 2006 Mild vascular dementia Continue Plavix 75 daily Amantadine 100 daily discontinued Continue Tegretol 5 mils 3 times daily Ativan half tablet daily as well as lorazepam ER at bedtime-watch for sedation Would hold hydroxyzine (never started this in the outpatient)  HTN Moderate control continue amlodipine 5, can use labetalol 10 every 2 as needed IV if needed  Normocytic iron deficiency anemia Patient given ferrous gluconate x2 Continue ferrous sulfate and recheck iron studies in the next several weeks  Left upper extremity superficial DVT Heat compresses placed  BMI  25 postsurgical state and moderate malnutrition Supplement as needed   DVT prophylaxis: Lovenox Code Status: DNR Family Communication: Discussed with wife at the bedside Disposition:  Status is: Inpatient Remains inpatient appropriate because: Requires likely skilled  care and authorization for the same   Consultants:  Gen surg  Procedures:   Antimicrobials:     Subjective: Awake coherent pleasant no distress looks well feels well Was able to get up with moderate assistance yesterday-he has made a huge decline from his prior state when he was able to ambulate independently wife is hopeful that he will get some rehab to strengthen him at rehab facility He is passing gas-having stool He has no chest pain  Objective: Vitals:   03/09/22 1729 03/09/22 2020 03/09/22 2022 03/10/22 0400  BP: (!) 151/73 (!) 159/78    Pulse: 73 72    Resp: 17 16    Temp: 98 F (36.7 C) 98.3 F (36.8 C)    TempSrc: Oral Oral    SpO2: 99% 99% 100% 98%  Weight:      Height:        Intake/Output Summary (Last 24 hours) at 03/10/2022 0732 Last data filed at 03/10/2022 0325 Gross per 24 hour  Intake 780 ml  Output 1900 ml  Net -1120 ml   Filed Weights   02/26/22 1827  Weight: 81.6 kg    Examination:  EOMI NCAT no focal deficit no icterus no pallor quite frail Chest is clear no wheeze no rales no rhonchi CTA B no added sound S1-S2 no murmur no rubs No LE edema Power gososly 5/5 bilat  Data Reviewed: personally reviewed   CBC    Component Value Date/Time   WBC 6.0 03/10/2022 0322   RBC 2.25 (L) 03/10/2022  0322   HGB 7.2 (L) 03/10/2022 0322   HGB 12.5 (L) 04/25/2019 1000   HCT 22.2 (L) 03/10/2022 0322   HCT 38.5 04/25/2019 1000   PLT 201 03/10/2022 0322   PLT 252 04/25/2019 1000   MCV 98.7 03/10/2022 0322   MCV 94 04/25/2019 1000   MCH 32.0 03/10/2022 0322   MCHC 32.4 03/10/2022 0322   RDW 13.4 03/10/2022 0322   RDW 12.6 04/25/2019 1000   LYMPHSABS 1.0 02/26/2022 0926    LYMPHSABS 2.2 04/25/2019 1000   MONOABS 1.1 (H) 02/26/2022 0926   EOSABS 0.1 02/26/2022 0926   EOSABS 0.3 04/25/2019 1000   BASOSABS 0.1 02/26/2022 0926   BASOSABS 0.0 04/25/2019 1000      Latest Ref Rng & Units 03/10/2022    3:22 AM 03/09/2022    3:48 AM 03/08/2022    4:39 AM  CMP  Glucose 70 - 99 mg/dL 103  118  105   BUN 8 - 23 mg/dL '5  5  9   '$ Creatinine 0.61 - 1.24 mg/dL 1.25  1.29  1.34   Sodium 135 - 145 mmol/L 137  135  137   Potassium 3.5 - 5.1 mmol/L 3.7  3.7  3.6   Chloride 98 - 111 mmol/L 109  109  104   CO2 22 - 32 mmol/L '25  23  24   '$ Calcium 8.9 - 10.3 mg/dL 7.6  7.4  8.1   Total Protein 6.5 - 8.1 g/dL 4.6  4.7  4.9   Total Bilirubin 0.3 - 1.2 mg/dL 0.3  0.4  0.4   Alkaline Phos 38 - 126 U/L 116  117  118   AST 15 - 41 U/L 24  32  34   ALT 0 - 44 U/L 32  39  40      Radiology Studies: No results found.   Scheduled Meds:  amantadine  100 mg Oral Daily   amLODipine  5 mg Oral Daily   bisacodyl  10 mg Rectal Daily   carBAMazepine  100 mg Oral 2 times per day   carBAMazepine  200 mg Oral Q2200   Chlorhexidine Gluconate Cloth  6 each Topical Daily   clopidogrel  75 mg Oral Daily   enoxaparin (LOVENOX) injection  40 mg Subcutaneous Q24H   ferrous sulfate  300 mg Oral Q breakfast   LORazepam  0.5 mg Intravenous BID   mouth rinse  15 mL Mouth Rinse 4 times per day   pantoprazole  40 mg Oral Daily   polyethylene glycol  17 g Oral Daily   sodium chloride flush  10-40 mL Intracatheter Q12H   tamsulosin  0.4 mg Oral QPC supper   traZODone  75 mg Oral QHS   Continuous Infusions:  lactated ringers 75 mL/hr at 03/10/22 0325   methocarbamol (ROBAXIN) IV       LOS: 11 days   Time spent: [place], MD [facility] To contact the attending provider between 7A-7P or the covering provider during after hours 7P-7A, please log into the web site [email or web address] and access using universal [place] password for that web site. If you do not have the  password, please call the hospital operator.  03/10/2022, 7:32 AM

## 2022-03-11 ENCOUNTER — Ambulatory Visit: Payer: PPO | Admitting: Occupational Therapy

## 2022-03-11 ENCOUNTER — Ambulatory Visit: Payer: PPO | Admitting: Physical Therapy

## 2022-03-11 DIAGNOSIS — K9189 Other postprocedural complications and disorders of digestive system: Secondary | ICD-10-CM | POA: Diagnosis not present

## 2022-03-11 DIAGNOSIS — K567 Ileus, unspecified: Secondary | ICD-10-CM | POA: Diagnosis not present

## 2022-03-11 LAB — COMPREHENSIVE METABOLIC PANEL
ALT: 29 U/L (ref 0–44)
AST: 22 U/L (ref 15–41)
Albumin: 1.8 g/dL — ABNORMAL LOW (ref 3.5–5.0)
Alkaline Phosphatase: 122 U/L (ref 38–126)
Anion gap: 10 (ref 5–15)
BUN: 6 mg/dL — ABNORMAL LOW (ref 8–23)
CO2: 21 mmol/L — ABNORMAL LOW (ref 22–32)
Calcium: 7.8 mg/dL — ABNORMAL LOW (ref 8.9–10.3)
Chloride: 107 mmol/L (ref 98–111)
Creatinine, Ser: 1.39 mg/dL — ABNORMAL HIGH (ref 0.61–1.24)
GFR, Estimated: 52 mL/min — ABNORMAL LOW (ref >60)
Glucose, Bld: 104 mg/dL — ABNORMAL HIGH (ref 70–99)
Potassium: 3.7 mmol/L (ref 3.5–5.1)
Sodium: 138 mmol/L (ref 135–145)
Total Bilirubin: 0.3 mg/dL (ref 0.3–1.2)
Total Protein: 4.9 g/dL — ABNORMAL LOW (ref 6.5–8.1)

## 2022-03-11 LAB — CBC
HCT: 23.2 % — ABNORMAL LOW (ref 39.0–52.0)
Hemoglobin: 7.6 g/dL — ABNORMAL LOW (ref 13.0–17.0)
MCH: 32.6 pg (ref 26.0–34.0)
MCHC: 32.8 g/dL (ref 30.0–36.0)
MCV: 99.6 fL (ref 80.0–100.0)
Platelets: 216 10*3/uL (ref 150–400)
RBC: 2.33 MIL/uL — ABNORMAL LOW (ref 4.22–5.81)
RDW: 13.4 % (ref 11.5–15.5)
WBC: 6.3 10*3/uL (ref 4.0–10.5)
nRBC: 0 % (ref 0.0–0.2)

## 2022-03-11 MED ORDER — AMLODIPINE BESYLATE 10 MG PO TABS
10.0000 mg | ORAL_TABLET | Freq: Every day | ORAL | Status: DC
  Administered 2022-03-12: 10 mg via ORAL
  Filled 2022-03-11: qty 1

## 2022-03-11 NOTE — Progress Notes (Signed)
PROGRESS NOTE   William [name]  EPP:[id number] DOB: 1940/09/20 DOA: 02/26/2022 PCP: Ann Held, DO  Brief Narrative:  81 year old white male Known HTN HLD CVA residual left-sided hemiplegia + carotid stenosis status post bilateral CEA [first 2006]-mild vascular dementia CKD 4 BPH bladder cancer status post TURP Reflux Recent hospitalization 9/8-9/14 2023 nausea and vomiting found to have acute cholecystitis choledocholithiasis with underlying CP 9/11 lap chole 9/12 and was cleared for discharge  Return with emesis and presyncope and was admitted for ileus-eventually needed NG tube and this had to be replaced despite improvement back on 9/19--finally NG was d/c 9/22 and patient now on dys 3 diet Speech therapy is following  Hospital-Problem based course  Ileus status post recent Recent lap chole Ileus is now resolved patient is on dysphagia diet--- monitor trends --patient is passing flatus Mild abdominal today-advised to go slowly on diet Tramadol from prior to admission has been held  AKI superimposed on CKD 3B-baseline creatinine 1.6 on arrival creatinine was 2.2  mild metabolic acidosis from AKI Hypernatremia on admission of 147 Discontinue LR 75/H--Labs show normalization of most parameters Hypokalemia is also normalized  CVA residual left-sided hemiplegia CEA 2006 Mild vascular dementia Continue Plavix 75 daily Amantadine 100 daily discontinued Continue Tegretol 5 mils 3 times daily Ativan half tablet daily as well as lorazepam ER at bedtime-watch for sedation Would hold hydroxyzine (never started this in the outpatient)  HTN Not controlled increase amlodipine to 10 mg can use labetalol 10 every 2 as needed IV if needed  Normocytic iron deficiency anemia Patient given ferrous gluconate x2 Continue ferrous sulfate and recheck iron studies in the next several weeks  Left upper extremity superficial DVT Heat compresses placed  BMI 25 postsurgical state and  moderate malnutrition Supplement as needed   DVT prophylaxis: Lovenox Code Status: DNR Family Communication: Discussed with wife at the bedside Disposition:  Status is: Inpatient Remains inpatient appropriate because: Requires likely skilled  care and authorization for the same   Consultants:  Gen surg  Procedures:   Antimicrobials:     Subjective:  No stool today-some abdominal discomfort in the lower quadrant passing gas still No chest pain  Objective: Vitals:   03/10/22 1554 03/10/22 2051 03/11/22 0638 03/11/22 0835  BP: (!) 148/73 (!) 169/73 (!) 149/73 (!) 162/78  Pulse: 71 68 71 72  Resp: '18 18 16 16  '$ Temp: 98.7 F (37.1 C) 98 F (36.7 C) (!) 97.5 F (36.4 C) (!) 97.3 F (36.3 C)  TempSrc: Oral Oral Oral Oral  SpO2: 98% 98% 96% 98%  Weight:      Height:        Intake/Output Summary (Last 24 hours) at 03/11/2022 1029 Last data filed at 03/11/2022 0800 Gross per 24 hour  Intake 3495.17 ml  Output 2775 ml  Net 720.17 ml    Filed Weights   02/26/22 1827  Weight: 81.6 kg    Examination:  EOMI NCAT no focal deficit oriented x3 Chest is clear no wheeze rales rhonchi Abdomen slightly distended laparoscopic scars noted no rebound no guarding S1-S2 no murmur No lower extremity edema Psych euthymic  Data Reviewed: personally reviewed   CBC    Component Value Date/Time   WBC 6.3 03/11/2022 0313   RBC 2.33 (L) 03/11/2022 0313   HGB 7.6 (L) 03/11/2022 0313   HGB 12.5 (L) 04/25/2019 1000   HCT 23.2 (L) 03/11/2022 0313   HCT 38.5 04/25/2019 1000   PLT 216 03/11/2022 0313   PLT  252 04/25/2019 1000   MCV 99.6 03/11/2022 0313   MCV 94 04/25/2019 1000   MCH 32.6 03/11/2022 0313   MCHC 32.8 03/11/2022 0313   RDW 13.4 03/11/2022 0313   RDW 12.6 04/25/2019 1000   LYMPHSABS 1.0 02/26/2022 0926   LYMPHSABS 2.2 04/25/2019 1000   MONOABS 1.1 (H) 02/26/2022 0926   EOSABS 0.1 02/26/2022 0926   EOSABS 0.3 04/25/2019 1000   BASOSABS 0.1 02/26/2022 0926    BASOSABS 0.0 04/25/2019 1000      Latest Ref Rng & Units 03/11/2022    3:13 AM 03/10/2022    3:22 AM 03/09/2022    3:48 AM  CMP  Glucose 70 - 99 mg/dL 104  103  118   BUN 8 - 23 mg/dL '6  5  5   '$ Creatinine 0.61 - 1.24 mg/dL 1.39  1.25  1.29   Sodium 135 - 145 mmol/L 138  137  135   Potassium 3.5 - 5.1 mmol/L 3.7  3.7  3.7   Chloride 98 - 111 mmol/L 107  109  109   CO2 22 - 32 mmol/L '21  25  23   '$ Calcium 8.9 - 10.3 mg/dL 7.8  7.6  7.4   Total Protein 6.5 - 8.1 g/dL 4.9  4.6  4.7   Total Bilirubin 0.3 - 1.2 mg/dL 0.3  0.3  0.4   Alkaline Phos 38 - 126 U/L 122  116  117   AST 15 - 41 U/L 22  24  32   ALT 0 - 44 U/L 29  32  39      Radiology Studies: No results found.   Scheduled Meds:  amantadine  100 mg Oral Daily   amLODipine  5 mg Oral Daily   bisacodyl  10 mg Rectal Daily   carBAMazepine  100 mg Oral 2 times per day   carBAMazepine  200 mg Oral Q2200   Chlorhexidine Gluconate Cloth  6 each Topical Daily   clopidogrel  75 mg Oral Daily   enoxaparin (LOVENOX) injection  40 mg Subcutaneous Q24H   ferrous sulfate  300 mg Oral Q breakfast   LORazepam  0.5 mg Intravenous BID   mouth rinse  15 mL Mouth Rinse 4 times per day   pantoprazole  40 mg Oral Daily   polyethylene glycol  17 g Oral Daily   sodium chloride flush  10-40 mL Intracatheter Q12H   tamsulosin  0.4 mg Oral QPC supper   traZODone  75 mg Oral QHS   Continuous Infusions:     LOS: 12 days   Time spent: 52  Nita Sells, MD [facility] To contact the attending provider between 7A-7P or the covering provider during after hours 7P-7A, please log into the web site [email or web address] and access using universal [place] password for that web site. If you do not have the password, please call the hospital operator.  03/11/2022, 10:29 AM

## 2022-03-11 NOTE — Plan of Care (Signed)
  Problem: Nutrition: Goal: Adequate nutrition will be maintained Outcome: Progressing   

## 2022-03-11 NOTE — TOC Progression Note (Signed)
Transition of Care [facility]) - Initial/Assessment Note    Patient Details  Name: William [name] MRN: [id number] Date of Birth: 05-Jan-1941  Transition of [facility]) CM/SW Contact:    Milinda Antis, Katherine Phone Number: 03/11/2022, 3:44 PM  Clinical Narrative:                 CSW received a call from Fate with [employer].  The medical reviewer has denied insurance authorization for SNF.  A peer to peer was offered to be completed by noon tomorrow.  MD and family notified.    Expected Discharge Plan: [place] Barriers to Discharge: Continued Medical Work up   Patient Goals and CMS Choice Patient states their goals for this hospitalization and ongoing recovery are:: To return home CMS Medicare.gov Compare Post Acute Care list provided to:: Patient Choice offered to / list presented to : Patient, Spouse  Expected Discharge Plan and Services Expected Discharge Plan: [place]   Discharge Planning Services: CM Consult Post Acute Care Choice: [place] arrangements for the past 2 months: Single Family Home                 DME Arranged: N/A DME Agency: NA       HH Arranged: PT, OT (Custodial Services) [place] Agency: [place] ([place]) Date [place]: 03/02/22 Time [place]: 2620 Representative spoke with at [place]: [place] Arrangements/Services Living arrangements for the past 2 months: [place] with:: Spouse Patient language and need for interpreter reviewed:: Yes Do you feel safe going back to the place where you live?: Yes      Need for Family Participation in Patient Care: Yes (Comment) Care giver support system in place?: Yes (comment)   Criminal Activity/Legal Involvement Pertinent to Current Situation/Hospitalization: No - Comment as needed  Activities of Daily Living Home Assistive Devices/Equipment: None ADL Screening (condition at time of admission) Patient's  cognitive ability adequate to safely complete daily activities?: No Is the patient deaf or have difficulty hearing?: No Does the patient have difficulty seeing, even when wearing glasses/contacts?: No Does the patient have difficulty concentrating, remembering, or making decisions?: Yes Patient able to express need for assistance with ADLs?: Yes Does the patient have difficulty dressing or bathing?: Yes Independently performs ADLs?: No Does the patient have difficulty walking or climbing stairs?: Yes Weakness of Legs: Both Weakness of Arms/Hands: Left  Permission Sought/Granted Permission sought to share information with : Case Manager, Customer service manager, Family Supports Permission granted to share information with : Yes, Verbal Permission Granted              Emotional Assessment Appearance:: Appears stated age Attitude/Demeanor/Rapport: Gracious Michela Pitcher thank you.) Affect (typically observed): Accepting Orientation: : Oriented to Self Alcohol / Substance Use: Not Applicable Psych Involvement: No (comment)  Admission diagnosis:  Ileus, postoperative ([place]) [K91.89, K56.7] Ileus following gastrointestinal surgery ([place]) [K91.89, K56.7] Near syncope [R55] Postoperative ileus ([place]) [K91.89, K56.7] Patient Active Problem List   Diagnosis Date Noted   Postoperative ileus ([place]) 02/27/2022   Ileus, postoperative ([place]) 02/26/2022   Choledocholithiasis 02/19/2022   DNR (do not resuscitate) 02/19/2022   Left elbow pain 01/26/2022   Mid back pain on left side 01/26/2022   Rib pain 01/26/2022   Acute pain of left shoulder 11/12/2021   Leukocytes in urine 11/12/2021   Urinary frequency 11/12/2021   Thrush 10/08/2021   Hemiplegia, dominant side S/P CVA (  cerebrovascular accident) ([place]) 09/11/2021   Insomnia    Prediabetes    Acute 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 ([place])    Basal ganglia infarction ([place]) 07/29/2021   Transaminitis 07/27/2021    UTI (urinary tract infection) 07/27/2021   CVA (cerebral vascular accident) ([place]) 07/27/2021   Fall 07/27/2021   Hyperglycemia 07/27/2021   Cholelithiasis 07/27/2021   Hypoxia 07/27/2021   Nausea and vomiting 46/80/3212   Acute metabolic encephalopathy 24/82/5003   Normocytic anemia 07/27/2021   Chronic back pain 07/27/2021   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[place]) 06/22/2021   Closed fracture of first lumbar vertebra with routine healing 02/03/2021   Closed fracture of multiple ribs 11/18/2020   Anxiety 01/29/2020   Leg pain, bilateral 01/29/2020   Ingrown toenail 07/13/2019   Lumbar spondylosis 05/02/2018   Pain in left knee 03/09/2018   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   Trochanteric bursitis of left hip 01/16/2018   Preventative health care 09/26/2017   HTN (hypertension) 07/19/2015   Hyperlipidemia 07/19/2015   Great toe pain 02/11/2014   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, [place] [address] Phone: [phone] Fax: [place] [address], [place] [address] Phone: [phone] Fax: [phone]  [facility] [address] [name] [address] Phone: [phone] Fax: [phone]  [facility] [place]Phone: [phone]: [phone] [id number] [place] [place]: [phone]: [phone]   Social Determinants of Health (SDOH) Interventions    Readmission Risk Interventions    02/23/2022    1:27 PM  Readmission Risk Prevention Plan  Transportation Screening Complete  PCP or Specialist Appt within 3-5 Days Complete  HRI or Ho[place]baomplete  Social Work Consult for ReCrosspointelanning/Counseling Complete  Palliative Care Screening Complete  Medication Review (RPress photographerComplete

## 2022-03-12 DIAGNOSIS — I69359 Hemiplegia and hemiparesis following cerebral infarction affecting unspecified side: Secondary | ICD-10-CM | POA: Diagnosis not present

## 2022-03-12 DIAGNOSIS — F1320 Sedative, hypnotic or anxiolytic dependence, uncomplicated: Secondary | ICD-10-CM | POA: Diagnosis not present

## 2022-03-12 DIAGNOSIS — F411 Generalized anxiety disorder: Secondary | ICD-10-CM | POA: Diagnosis not present

## 2022-03-12 DIAGNOSIS — Z8616 Personal history of COVID-19: Secondary | ICD-10-CM | POA: Diagnosis not present

## 2022-03-12 DIAGNOSIS — Z48815 Encounter for surgical aftercare following surgery on the digestive system: Secondary | ICD-10-CM | POA: Diagnosis not present

## 2022-03-12 DIAGNOSIS — S2249XA Multiple fractures of ribs, unspecified side, initial encounter for closed fracture: Secondary | ICD-10-CM | POA: Diagnosis not present

## 2022-03-12 DIAGNOSIS — M25512 Pain in left shoulder: Secondary | ICD-10-CM | POA: Diagnosis not present

## 2022-03-12 DIAGNOSIS — R4182 Altered mental status, unspecified: Secondary | ICD-10-CM | POA: Diagnosis not present

## 2022-03-12 DIAGNOSIS — G20A1 Parkinson's disease without dyskinesia, without mention of fluctuations: Secondary | ICD-10-CM | POA: Diagnosis not present

## 2022-03-12 DIAGNOSIS — N189 Chronic kidney disease, unspecified: Secondary | ICD-10-CM | POA: Diagnosis not present

## 2022-03-12 DIAGNOSIS — M6259 Muscle wasting and atrophy, not elsewhere classified, multiple sites: Secondary | ICD-10-CM | POA: Diagnosis not present

## 2022-03-12 DIAGNOSIS — I6381 Other cerebral infarction due to occlusion or stenosis of small artery: Secondary | ICD-10-CM | POA: Diagnosis not present

## 2022-03-12 DIAGNOSIS — Z7401 Bed confinement status: Secondary | ICD-10-CM | POA: Diagnosis not present

## 2022-03-12 DIAGNOSIS — G3184 Mild cognitive impairment, so stated: Secondary | ICD-10-CM | POA: Diagnosis not present

## 2022-03-12 DIAGNOSIS — R293 Abnormal posture: Secondary | ICD-10-CM | POA: Diagnosis not present

## 2022-03-12 DIAGNOSIS — Z872 Personal history of diseases of the skin and subcutaneous tissue: Secondary | ICD-10-CM | POA: Diagnosis not present

## 2022-03-12 DIAGNOSIS — S32019D Unspecified fracture of first lumbar vertebra, subsequent encounter for fracture with routine healing: Secondary | ICD-10-CM | POA: Diagnosis not present

## 2022-03-12 DIAGNOSIS — R531 Weakness: Secondary | ICD-10-CM | POA: Diagnosis not present

## 2022-03-12 DIAGNOSIS — G8929 Other chronic pain: Secondary | ICD-10-CM | POA: Diagnosis not present

## 2022-03-12 DIAGNOSIS — D631 Anemia in chronic kidney disease: Secondary | ICD-10-CM | POA: Diagnosis not present

## 2022-03-12 DIAGNOSIS — Z9189 Other specified personal risk factors, not elsewhere classified: Secondary | ICD-10-CM | POA: Diagnosis not present

## 2022-03-12 DIAGNOSIS — N184 Chronic kidney disease, stage 4 (severe): Secondary | ICD-10-CM | POA: Diagnosis not present

## 2022-03-12 DIAGNOSIS — Z9181 History of falling: Secondary | ICD-10-CM | POA: Diagnosis not present

## 2022-03-12 DIAGNOSIS — M6281 Muscle weakness (generalized): Secondary | ICD-10-CM | POA: Diagnosis not present

## 2022-03-12 DIAGNOSIS — Z79899 Other long term (current) drug therapy: Secondary | ICD-10-CM | POA: Diagnosis not present

## 2022-03-12 DIAGNOSIS — M25532 Pain in left wrist: Secondary | ICD-10-CM | POA: Diagnosis not present

## 2022-03-12 DIAGNOSIS — M47816 Spondylosis without myelopathy or radiculopathy, lumbar region: Secondary | ICD-10-CM | POA: Diagnosis not present

## 2022-03-12 DIAGNOSIS — R0902 Hypoxemia: Secondary | ICD-10-CM | POA: Diagnosis not present

## 2022-03-12 DIAGNOSIS — R2681 Unsteadiness on feet: Secondary | ICD-10-CM | POA: Diagnosis not present

## 2022-03-12 DIAGNOSIS — F5101 Primary insomnia: Secondary | ICD-10-CM | POA: Diagnosis not present

## 2022-03-12 DIAGNOSIS — E119 Type 2 diabetes mellitus without complications: Secondary | ICD-10-CM | POA: Diagnosis not present

## 2022-03-12 DIAGNOSIS — R1310 Dysphagia, unspecified: Secondary | ICD-10-CM | POA: Diagnosis not present

## 2022-03-12 DIAGNOSIS — M7502 Adhesive capsulitis of left shoulder: Secondary | ICD-10-CM | POA: Diagnosis not present

## 2022-03-12 DIAGNOSIS — Z87448 Personal history of other diseases of urinary system: Secondary | ICD-10-CM | POA: Diagnosis not present

## 2022-03-12 DIAGNOSIS — R41841 Cognitive communication deficit: Secondary | ICD-10-CM | POA: Diagnosis not present

## 2022-03-12 DIAGNOSIS — R262 Difficulty in walking, not elsewhere classified: Secondary | ICD-10-CM | POA: Diagnosis not present

## 2022-03-12 DIAGNOSIS — F0390 Unspecified dementia without behavioral disturbance: Secondary | ICD-10-CM | POA: Diagnosis not present

## 2022-03-12 DIAGNOSIS — I82612 Acute embolism and thrombosis of superficial veins of left upper extremity: Secondary | ICD-10-CM | POA: Diagnosis not present

## 2022-03-12 DIAGNOSIS — M79602 Pain in left arm: Secondary | ICD-10-CM | POA: Diagnosis not present

## 2022-03-12 DIAGNOSIS — I69354 Hemiplegia and hemiparesis following cerebral infarction affecting left non-dominant side: Secondary | ICD-10-CM | POA: Diagnosis not present

## 2022-03-12 DIAGNOSIS — M79632 Pain in left forearm: Secondary | ICD-10-CM | POA: Diagnosis not present

## 2022-03-12 DIAGNOSIS — R404 Transient alteration of awareness: Secondary | ICD-10-CM | POA: Diagnosis not present

## 2022-03-12 DIAGNOSIS — Z1331 Encounter for screening for depression: Secondary | ICD-10-CM | POA: Diagnosis not present

## 2022-03-12 DIAGNOSIS — K9189 Other postprocedural complications and disorders of digestive system: Secondary | ICD-10-CM | POA: Diagnosis not present

## 2022-03-12 DIAGNOSIS — G9341 Metabolic encephalopathy: Secondary | ICD-10-CM | POA: Diagnosis not present

## 2022-03-12 DIAGNOSIS — K5900 Constipation, unspecified: Secondary | ICD-10-CM | POA: Diagnosis not present

## 2022-03-12 DIAGNOSIS — N1832 Chronic kidney disease, stage 3b: Secondary | ICD-10-CM | POA: Diagnosis not present

## 2022-03-12 DIAGNOSIS — R1311 Dysphagia, oral phase: Secondary | ICD-10-CM | POA: Diagnosis not present

## 2022-03-12 DIAGNOSIS — U071 COVID-19: Secondary | ICD-10-CM | POA: Diagnosis not present

## 2022-03-12 DIAGNOSIS — Z7902 Long term (current) use of antithrombotics/antiplatelets: Secondary | ICD-10-CM | POA: Diagnosis not present

## 2022-03-12 DIAGNOSIS — M5430 Sciatica, unspecified side: Secondary | ICD-10-CM | POA: Diagnosis not present

## 2022-03-12 DIAGNOSIS — G20C Parkinsonism, unspecified: Secondary | ICD-10-CM | POA: Diagnosis not present

## 2022-03-12 DIAGNOSIS — Z8719 Personal history of other diseases of the digestive system: Secondary | ICD-10-CM | POA: Diagnosis not present

## 2022-03-12 DIAGNOSIS — R5381 Other malaise: Secondary | ICD-10-CM | POA: Diagnosis not present

## 2022-03-12 DIAGNOSIS — K567 Ileus, unspecified: Secondary | ICD-10-CM | POA: Diagnosis not present

## 2022-03-12 LAB — COMPREHENSIVE METABOLIC PANEL
ALT: 25 U/L (ref 0–44)
AST: 17 U/L (ref 15–41)
Albumin: 2 g/dL — ABNORMAL LOW (ref 3.5–5.0)
Alkaline Phosphatase: 123 U/L (ref 38–126)
Anion gap: 4 — ABNORMAL LOW (ref 5–15)
BUN: 7 mg/dL — ABNORMAL LOW (ref 8–23)
CO2: 26 mmol/L (ref 22–32)
Calcium: 8 mg/dL — ABNORMAL LOW (ref 8.9–10.3)
Chloride: 108 mmol/L (ref 98–111)
Creatinine, Ser: 1.4 mg/dL — ABNORMAL HIGH (ref 0.61–1.24)
GFR, Estimated: 51 mL/min — ABNORMAL LOW (ref >60)
Glucose, Bld: 102 mg/dL — ABNORMAL HIGH (ref 70–99)
Potassium: 3.6 mmol/L (ref 3.5–5.1)
Sodium: 138 mmol/L (ref 135–145)
Total Bilirubin: 0.4 mg/dL (ref 0.3–1.2)
Total Protein: 5.3 g/dL — ABNORMAL LOW (ref 6.5–8.1)

## 2022-03-12 LAB — CBC
HCT: 23.6 % — ABNORMAL LOW (ref 39.0–52.0)
Hemoglobin: 7.8 g/dL — ABNORMAL LOW (ref 13.0–17.0)
MCH: 32.6 pg (ref 26.0–34.0)
MCHC: 33.1 g/dL (ref 30.0–36.0)
MCV: 98.7 fL (ref 80.0–100.0)
Platelets: 242 10*3/uL (ref 150–400)
RBC: 2.39 MIL/uL — ABNORMAL LOW (ref 4.22–5.81)
RDW: 13.4 % (ref 11.5–15.5)
WBC: 6.2 10*3/uL (ref 4.0–10.5)
nRBC: 0 % (ref 0.0–0.2)

## 2022-03-12 MED ORDER — LOREEV XR 1 MG PO CS24: 1.0000 mg | EXTENDED_RELEASE_CAPSULE | Freq: Every day | ORAL | 0 refills | Status: DC

## 2022-03-12 MED ORDER — TRAZODONE HCL 150 MG PO TABS: 75.0000 mg | ORAL_TABLET | Freq: Every day | ORAL | 0 refills | Status: DC

## 2022-03-12 MED ORDER — CARBAMAZEPINE 100 MG/5ML PO SUSP: 100.0000 mg | ORAL | 0 refills | Status: DC

## 2022-03-12 MED ORDER — LORAZEPAM 0.5 MG PO TABS: 0.2500 mg | ORAL_TABLET | Freq: Every day | ORAL | 0 refills | Status: DC

## 2022-03-12 NOTE — Progress Notes (Signed)
Physical Therapy Treatment Patient Details Name: William [name] MRN: [id number] DOB: 1940-07-12 Today's Date: 03/12/2022   History of Present Illness 81 yo male returns to [facility] after ERCP and lap chole with abdominal pain, emesis, presyncope and found to have ileus. NG tube placed 9/16.  PMHx:  stroke with L  hemiparesis, CKD3, HTN, vascular dementia, bladder cancer, choledocholithiasis s/p ERCP    PT Comments    Pt alert and looking much better sitting up in bed. Pt and wife are eager to get to [facility] this afternoon and pt agreeable to getting up to recliner for lunch. Pt with improved strength and endurance today tolerating longer bouts of unsupported sitting and duration of standing for pericare and stepping to recliner. Pt continues to require repetitive single step commands with minimal distraction to perform task at hand. Pt needing max-modAx 2 for all mobility. Pt is motivated and will do well in SNF prior to return home.    Recommendations for follow up therapy are one component of a multi-disciplinary discharge planning process, led by the attending physician.  Recommendations may be updated based on patient status, additional functional criteria and insurance authorization.  Follow Up Recommendations  Skilled nursing-short term rehab (<3 hours/day) Can patient physically be transported by private vehicle: No   Assistance Recommended at Discharge Frequent or constant Supervision/Assistance  Patient can return home with the following Direct supervision/assist for medications management;Direct supervision/assist for financial management;Assist for transportation;Help with stairs or ramp for entrance;Two people to help with walking and/or transfers;Two people to help with bathing/dressing/bathroom   Equipment Recommendations  None recommended by PT       Precautions / Restrictions Precautions Precautions: Fall Precaution Comments: L hemiparesis Restrictions Weight Bearing  Restrictions: No Other Position/Activity Restrictions: per wife has L clavicle dislocation several weeks ago, non-op; was in sling 1 week     Mobility  Bed Mobility Overal bed mobility: Needs Assistance Bed Mobility: Supine to Sit     Supine to sit: Max assist, +2 for physical assistance, HOB elevated     General bed mobility comments: maxAx2 for facilitating LE management across bed, trunk rotation on L side and trunk to upright    Transfers Overall transfer level: Needs assistance Equipment used: 2 person hand held assist Transfers: Sit to/from Stand, Bed to chair/wheelchair/BSC Sit to Stand: +2 physical assistance, +2 safety/equipment, Mod assist   Step pivot transfers: +2 physical assistance, Mod assist       General transfer comment: modAx2 for power up from elevated surface, able to stand for pericare prior to stepping to recliner, able to stand for additional pericare prior to sitting       Modified Rankin (Stroke Patients Only) Modified Rankin (Stroke Patients Only) Pre-Morbid Rankin Score: Moderately severe disability Modified Rankin: Severe disability     Balance Overall balance assessment: Needs assistance Sitting-balance support: Feet supported Sitting balance-Leahy Scale: Poor Sitting balance - Comments: Min A for sitting balance Postural control: Left lateral lean, Posterior lean Standing balance support: Bilateral upper extremity supported Standing balance-Leahy Scale: Poor Standing balance comment: Reliant on UE and external support                            Cognition Arousal/Alertness: Awake/alert Behavior During Therapy: Flat affect Overall Cognitive Status: History of cognitive impairments - at baseline  General Comments: pt requiring single step multimodal cue without distractions to facilitate mobility, with increased time and repetition able to complete           General Comments  General comments (skin integrity, edema, etc.): wife present and supportive, both eager to get to SNF this afternoon      Pertinent Vitals/Pain Pain Assessment Pain Assessment: Faces Faces Pain Scale: Hurts little more Pain Location: L shoulder Pain Descriptors / Indicators: Discomfort Pain Intervention(s): Limited activity within patient's tolerance, Monitored during session, Repositioned     PT Goals (current goals can now be found in the care plan section) Acute Rehab PT Goals Patient Stated Goal: none stated PT Goal Formulation: With family Time For Goal Achievement: 03/20/22 Potential to Achieve Goals: Good Progress towards PT goals: Progressing toward goals    Frequency    Min 3X/week      PT Plan Current plan remains appropriate       AM-PAC PT "6 Clicks" Mobility   Outcome Measure  Help needed turning from your back to your side while in a flat bed without using bedrails?: A Lot Help needed moving from lying on your back to sitting on the side of a flat bed without using bedrails?: A Lot Help needed moving to and from a bed to a chair (including a wheelchair)?: Total Help needed standing up from a chair using your arms (e.g., wheelchair or bedside chair)?: Total Help needed to walk in hospital room?: Total Help needed climbing 3-5 steps with a railing? : Total 6 Click Score: 8    End of Session Equipment Utilized During Treatment: Gait belt Activity Tolerance: Patient tolerated treatment well Patient left: with family/visitor present;in chair;with call bell/phone within reach;with chair alarm set Nurse Communication: Mobility status PT Visit Diagnosis: Unsteadiness on feet (R26.81);Muscle weakness (generalized) (M62.81);Difficulty in walking, not elsewhere classified (R26.2);Hemiplegia and hemiparesis Hemiplegia - Right/Left: Left Hemiplegia - dominant/non-dominant: Non-dominant Hemiplegia - caused by: Nontraumatic intracerebral hemorrhage     Time:  1135-1203 PT Time Calculation (min) (ACUTE ONLY): 28 min  Charges:  $Therapeutic Activity: 23-37 mins                     Verble Styron B. Migdalia Dk PT, DPT Acute Rehabilitation Services Please use secure chat or  Call Office [phone]    [place] 03/12/2022, 12:58 PM

## 2022-03-12 NOTE — Plan of Care (Signed)

## 2022-03-12 NOTE — Progress Notes (Signed)
Pt plan to follow up with [facility] Nuala Alpha when at rehab after discharge.

## 2022-03-12 NOTE — Discharge Summary (Addendum)
Physician Discharge Summary  William [name] DVV:[id number] DOB: 11/24/1940 DOA: 02/26/2022  PCP: Ann Held, DO  Admit date: 02/26/2022 Discharge date: 03/12/2022  Time spent: 40 minutes  Recommendations for Outpatient Follow-up:  Requires Chem-12 CBC in about 1 week Try to de-escalate polypharmacy is on variable dosing of Ativan as well as Tegretol may be able to minimize the Ativan dosing--have discontinued tramadol this hospital stay Needs outpatient follow-up to be arranged by skilled facility to go and see Dr. Nuala Alpha at [facility] for reevaluation of left shoulder dislocation Follow-up with general surgery as previously scheduled Family is interested in hospice-I have CCed note to Dr. 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 Recent acute choledocholithiasis and cholecystitis status post lap chole  Please see below for itemized issues addressed in [place]- refer to other progress notes for clarity if needed  Discharge Condition: Improved  Diet recommendation: Heart healthy  Filed Weights   02/26/22 1827  Weight: 81.6 kg    History of present illness:  81 year old white male Known HTN HLD CVA residual left-sided hemiplegia + carotid stenosis status post bilateral CEA [first 2006]-mild vascular dementia CKD 4 BPH bladder cancer status post TURP Reflux Recent hospitalization 9/8-9/14 2023 nausea and vomiting found to have acute cholecystitis choledocholithiasis with underlying CP 9/11 lap chole 9/12 and was cleared for discharge   Return with emesis and presyncope and was admitted for ileus-eventually needed NG tube and this had to be replaced despite improvement back on 9/19--finally NG was d/c 9/22 and patient now on dys 3  diet  Patient recovered mental state recovered as per below  Hospital Course:  Ileus status post recent Recent lap chole Ileus is now resolved patient is on dysphagia diet--- monitor trends --patient is passing flatus Patient was placed on Gastrografin protocol and despite fluctuance and need to reinsert NG tube this was finally discontinued on 9/22 and general surgery signed off Stabilized during hospital stay outpatient follow-up with general surgery seems to be scheduled for 7/5 and will need to be coordinated   AKI superimposed on CKD 3B-baseline creatinine 1.6 on arrival creatinine was 2.2  mild metabolic acidosis from AKI Hypernatremia on admission of 147 Labs all normalized on last lab check 9/29 with sodium 138 potassium 3.6 and AKI resolved  CVA residual left-sided hemiplegia CEA 2006 Mild vascular dementia Continue Plavix 75 daily Amantadine 100 daily discontinued Continue Tegretol 5 mils 3 times daily Ativan half tablet daily as well as lorazepam ER at bedtime-watch for sedation Would hold hydroxyzine (never started this in the outpatient)--- also discontinued tramadol earlier in hospital stay   HTN Not controlled increase amlodipine to 10 mg May need to add another agent in the outpatient state   Recent dislocated clavicle Seen by Dr. Nuala Alpha of sports medicine Will require outpatient further follow-up in about a week to be coordinated by skilled facility  Normocytic iron deficiency anemia Patient given ferrous gluconate x2 Continue ferrous sulfate and recheck iron studies in the next several weeks   Left upper extremity superficial DVT Heat compresses placed   BMI 25 postsurgical state and moderate malnutrition Supplement as needed   Discharge Exam: Vitals:   03/11/22 2213 03/12/22 0534  BP: (!) 171/80 (!) 145/76  Pulse: 73 67  Resp: 18  16  Temp: 98.1 F (36.7 C) 98.4 F (36.9 C)  SpO2: 97% 98%    Subj on day of d/c   Awake coherent looks  well sitting up in bed about to eat no distress  General Exam on discharge  EOMI NCAT no focal deficit Chest is clear S1-S2 no murmur Abdomen is soft with no rebound no guarding ROM is intact he is a little bit weak on the left side Neurologically seems intact Power is 5/5 predominantly I did not check sensory or reflexes Psych is slightly flat  Discharge Instructions   Discharge Instructions     Diet - low sodium heart healthy   Complete by: As directed    Increase activity slowly   Complete by: As directed    No wound care   Complete by: As directed       Allergies as of 03/12/2022       Reactions   Bee Venom Anaphylaxis   Strawberry Extract Hives   Latex Itching   Zetia [ezetimibe] Other (See Comments)   Intolerance   Adhesive [tape] Other (See Comments)   blisters   Statins Other (See Comments)   myalgias        Medication List     STOP taking these medications    traMADol 50 MG tablet Commonly known as: ULTRAM       TAKE these medications    acetaminophen 325 MG tablet Commonly known as: TYLENOL Take 2 tablets (650 mg total) by mouth every 6 (six) hours as needed for mild pain.   Adult One Daily Gummies Chew Chew 1 tablet by mouth every morning.   amantadine 100 MG capsule Commonly known as: SYMMETREL Take 1 capsule (100 mg total) by mouth daily. What changed: additional instructions   amLODipine 10 MG tablet Commonly known as: NORVASC Take 1 tablet (10 mg total) by mouth every morning. What changed: additional instructions   carBAMazepine 100 MG/5ML suspension Commonly known as: TEGretol Take 5-10 mLs (100-200 mg total) by mouth See admin instructions. Take 100 mg (5 mL) in the morning and afternoon. Then Take 200 mg (10 mL) in the evening   clopidogrel 75 MG tablet Commonly known as: PLAVIX Take 1 tablet (75 mg total) by mouth daily. What changed: additional instructions   clotrimazole-betamethasone cream Commonly known as:  LOTRISONE Apply 1 Application on to the skin daily. What changed:  when to take this reasons to take this   ferrous sulfate 325 (65 FE) MG EC tablet Take 325 mg by mouth daily with breakfast. *Crushed*   fluticasone 0.05 % cream Commonly known as: CUTIVATE 1 application daily as needed (psoriasis).   hydrALAZINE 25 MG tablet Commonly known as: APRESOLINE Take 1 tablet (25 mg total) by mouth every 8 (eight) hours. What changed: additional instructions   hydrOXYzine 25 MG capsule Commonly known as: Vistaril Take 1 capsule by mouth everyday at 3:30 PM as needed   Loreev XR 1 MG Cs24 Generic drug: LORazepam ER Take 1 mg by mouth at bedtime. *Crushed*   LORazepam 0.5 MG tablet Commonly known as: ATIVAN Take 0.5 tablets (0.25 mg total) by mouth daily. *Crushed*   pantoprazole 40 MG tablet Commonly known as: PROTONIX Take 1 tablet (40 mg total) by mouth daily. What changed: additional instructions   polyethylene glycol 17 g packet Commonly known as: MIRALAX / GLYCOLAX Take 17 g by mouth daily.   tamsulosin 0.4 MG Caps capsule Commonly known as: FLOMAX Take 1 capsule (0.4 mg total) by  mouth daily after supper. What changed: additional instructions   traZODone 150 MG tablet Commonly known as: DESYREL Take 0.5 tablets (75 mg total) by mouth at bedtime. *Crushed*   triamcinolone cream 0.1 % Commonly known as: KENALOG Apply 1 application topically 2 (two) times daily. What changed:  when to take this reasons to take this   Vitamin D3 25 MCG (1000 UT) Chew Chew 1 tablet by mouth daily.       Allergies  Allergen Reactions   Bee Venom Anaphylaxis   Strawberry Extract Hives   Latex Itching   Zetia [Ezetimibe] Other (See Comments)    Intolerance    Adhesive [Tape] Other (See Comments)    blisters   Statins Other (See Comments)    myalgias    Follow-up Information     Maczis, Puja Gosai, PA-C. Go on 03/18/2022.   Specialty: General Surgery Why: For  post-operative follow up. please call to confirm appointment date time. Contact information: [place] [address] [phone]         Nuala Alpha, MD Follow up in 1 week(s).   Specialty: Family Medicine Contact information: [address] [name] [address] [phone]                  The results of significant diagnostics from this hospitalization (including imaging, microbiology, ancillary and laboratory) are listed below for reference.    Significant Diagnostic Studies: [place] EKG SITE RITE  Result Date: 03/06/2022 If Site Rite image not attached, placement could not be confirmed due to current cardiac rhythm.  VAS [place] UPPER EXTREMITY VENOUS DUPLEX  Result Date: 03/05/2022 UPPER VENOUS STUDY  Patient Name:  GAYLIN BULTHUIS  Date of Exam:   03/05/2022 Medical Rec #: [id number]       Accession #:    [id number] Date of Birth: Oct 30, 1940       Patient Gender: M Patient Age:   45 years Exam Location:  [facility] Procedure:      VAS [place] UPPER EXTREMITY VENOUS DUPLEX Referring Phys: STEPHEN CHIU --------------------------------------------------------------------------------  Indications: Edema, and Swelling Comparison Study: no prior Performing Technologist: Archie Patten RVS  Examination Guidelines: A complete evaluation includes B-mode imaging, spectral Doppler, color Doppler, and power Doppler as needed of all accessible portions of each vessel. Bilateral testing is considered an integral part of a complete examination. Limited examinations for reoccurring indications may be performed as noted.  Right Findings: +----------+------------+---------+-----------+----------+-------+ RIGHT     CompressiblePhasicitySpontaneousPropertiesSummary +----------+------------+---------+-----------+----------+-------+ Subclavian               Yes       Yes                       +----------+------------+---------+-----------+----------+-------+  Left Findings: +----------+------------+---------+-----------+----------+--------------------+ LEFT      CompressiblePhasicitySpontaneousProperties      Summary        +----------+------------+---------+-----------+----------+--------------------+ IJV           Full       Yes       Yes                                   +----------+------------+---------+-----------+----------+--------------------+ Subclavian    Full       Yes       Yes                                   +----------+------------+---------+-----------+----------+--------------------+  Axillary      Full       Yes       Yes                                   +----------+------------+---------+-----------+----------+--------------------+ Brachial      Full       Yes       Yes                                   +----------+------------+---------+-----------+----------+--------------------+ Radial        Full                                                       +----------+------------+---------+-----------+----------+--------------------+ Ulnar         Full                                                       +----------+------------+---------+-----------+----------+--------------------+ Cephalic      None                                   mid-distal forearm                                                             level         +----------+------------+---------+-----------+----------+--------------------+ Basilic       Full                                                       +----------+------------+---------+-----------+----------+--------------------+  Summary:  Right: No evidence of thrombosis in the subclavian.  Left: No evidence of deep vein thrombosis in the upper extremity. Findings consistent with age indeterminate superficial vein thrombosis involving the left cephalic vein.  *See table(s) above for  measurements and observations.  Diagnosing physician: Jamelle Haring Electronically signed by Jamelle Haring on 03/05/2022 at 10:38:37 PM.    Final    DG Abd Portable 1V  Result Date: 03/05/2022 CLINICAL DATA:  Postoperative ileus EXAM: PORTABLE ABDOMEN - 1 VIEW COMPARISON:  Radiograph 03/02/2022 FINDINGS: Nasogastric tube tip and side port overlie the upper abdomen, likely distal stomach. Improvement in small bowel distension with mildly persistent colonic distension. Small bowel measures up to 3.3 cm, previously up to 5.2 cm. IMPRESSION: Nasogastric tube tip and side port overlie the right upper abdomen, likely distal stomach. Interval improvement in bowel distension. Electronically Signed   By: Maurine Simmering M.D.   On: 03/05/2022 13:01   DG CHEST PORT 1 VIEW  Result Date: 03/04/2022 CLINICAL DATA:  Weakness and confusion. History of stroke and hypertension. EXAM:  PORTABLE CHEST 1 VIEW COMPARISON:  Chest two views 01/28/2022 and 08/01/2021 FINDINGS: Enteric tube descends below the diaphragm with the tip excluded by collimation. Cardiac silhouette is at the upper limits of normal size. Mediastinal contours are within normal limits. Mild calcification within the aortic arch. Mild bibasilar interstitial thickening appears similar to prior and chronic. No focal airspace opacity to indicate pneumonia. No pleural effusion pneumothorax. Moderate multilevel degenerative disc changes of the thoracic spine. IMPRESSION: No acute cardiopulmonary disease process. Electronically Signed   By: Yvonne Kendall M.D.   On: 03/04/2022 11:52   DG Abd Portable 1V  Result Date: 03/02/2022 CLINICAL DATA:  Check gastric catheter placement EXAM: PORTABLE ABDOMEN - 1 VIEW COMPARISON:  07/31/2021 FINDINGS: Gastric catheter is noted deep within the stomach. Scattered large and small bowel gas is noted. Mild persistent small bowel dilatation is seen. Previously administered contrast lies within the colon. IMPRESSION: Gastric catheter deep  in the stomach. Persistent mild partial small bowel obstruction. Electronically Signed   By: Inez Catalina M.D.   On: 03/02/2022 20:40   DG Abd Portable 1V-Small Bowel Obstruction Protocol-initial, 8 hr delay  Result Date: 02/28/2022 CLINICAL DATA:  8 hour follow-up small-bowel film EXAM: PORTABLE ABDOMEN - 1 VIEW COMPARISON:  02/27/2022 FINDINGS: Scattered large and small bowel gas is noted. Persistent small bowel dilatation is noted although improved from the prior exam. Gastric catheter is noted in satisfactory position. Majority of the administered contrast now lies in the colon consistent with a partial small bowel obstruction. IMPRESSION: Changes consistent with partial small bowel obstruction with the majority of the previously administered contrast now lying within the colon. Overall decrease in the degree of small-bowel obstruction is noted. Electronically Signed   By: Inez Catalina M.D.   On: 02/28/2022 22:59   DG Abd 1 View  Result Date: 02/27/2022 CLINICAL DATA:  81 year old male with recent cholecystectomy. Mildly dilated small bowel on CT Abdomen and Pelvis yesterday. EXAM: ABDOMEN - 1 VIEW COMPARISON:  CT Abdomen and Pelvis 02/26/2022 and earlier. FINDINGS: Portable AP supine views at 0759 hours. New enteric tube terminates in the gastric body. Stable cholecystectomy clips. The proximal stomach appears more decompressed now. The distal stomach is still mildly distended. Gas distended small bowel loops are stable to mildly progressed throughout the abdomen. Excreted IV contrast in the urinary bladder. Paucity of large bowel gas. Stable lung bases. No definite pneumoperitoneum on these supine views. Stable visualized osseous structures. IMPRESSION: 1. New enteric tube terminates in the gastric body. Proximal stomach appears more decompressed, distal stomach since somewhat dilated. 2. Stable to mildly progressed small-bowel ileus versus small-bowel obstruction since the CT yesterday. No  pneumoperitoneum evident on these supine views. Electronically Signed   By: Genevie Ann M.D.   On: 02/27/2022 08:39   CT ABDOMEN PELVIS W CONTRAST  Addendum Date: 02/26/2022   ADDENDUM REPORT: 02/26/2022 12:26 ADDENDUM: The IV contrast administered for this exam was 100 mL Omnipaque 300 IV Electronically Signed   By: Yvonne Kendall M.D.   On: 02/26/2022 12:26   Result Date: 02/26/2022 CLINICAL DATA:  Abdominal pain.  Postoperative. EXAM: CT ABDOMEN AND PELVIS WITH CONTRAST TECHNIQUE: Multidetector CT imaging of the abdomen and pelvis was performed using the standard protocol following bolus administration of intravenous contrast. RADIATION DOSE REDUCTION: This exam was performed according to the departmental dose-optimization program which includes automated exposure control, adjustment of the mA and/or kV according to patient size and/or use of iterative reconstruction technique. CONTRAST:  100 mL Omnipaque 300  IV COMPARISON:  MRI abdomen 02/20/2022; right upper quadrant abdominal ultrasound 02/19/2022; CT abdomen and pelvis 10/02/2021 FINDINGS: Lower chest: There is mild fluid within the visualized inferior aspect of the left major fissure. Mild posterior right lower lobe curvilinear subsegmental atelectasis. Heart size is again mildly enlarged. Mild calcifications within the visualized aortic root, similar to prior. No pericardial effusion. Hepatobiliary: There is diffuse decreased density again seen throughout the liver suggesting fatty infiltration. There are new cholecystectomy clips. There appears to be interval resection of the prior gallstone-filled gallbladder. Within the gallbladder fossa there is scattered fluid and air density (axial series 1, images 25 through 30), presumably normal postoperative findings at this time. No liver lesion is seen. No intrahepatic or extrahepatic biliary ductal dilatation. Pancreas: There is again mild fatty infiltration of the pancreatic head greater than the downstream  pancreatic body. No pancreatic ductal dilatation is seen. Spleen: Normal in size without focal abnormality. Adrenals/Urinary Tract: Normal adrenals. The kidneys enhance uniformly and are symmetric in size. Multiple low-density renal lesions are again seen. Dominant multilobular fluid density cyst within the mid to upper pole of the right kidney measures up to 5.8 cm in transverse dimension and 5.8 cm in craniocaudal dimension, similar to prior. Additional smaller right renal fluid density cysts. There is a 13 mm left upper pole fluid density cyst, unchanged. No follow-up imaging recommended. No hydronephrosis. No focal urinary bladder wall thickening. Stomach/Bowel: Moderate to high-grade sigmoid and mild descending colon diverticulosis is again seen. Mild free fluid adjacent to the posteroinferior aspect of the proximal to mid sigmoid colon (axial series 1 images 86 through 91 and coronal series 9, image 60) is favored to be secondary to the postoperative ascites and not definite sigmoid diverticulitis. No definitive sigmoid colon inflammatory wall thickening. There is mild fluid around the descending colon which is new from 10/02/2021 CT, however the descending colon inflammatory wall thickening is no longer seen. The terminal ileum is unremarkable. The appendix is again not confidently identified. No definite inflammatory changes are seen around the cecum to indicate secondary signs of acute appendicitis. There is moderate fluid distention of the visualized distal esophagus, moderate to high-grade just above the gastroesophageal junction (axial image 20). There is moderate fluid throughout the patient's stomach. Note is made that patient may be at increased risk for aspiration given the distal esophageal fluid. There are air-fluid levels within the proximal to mid small bowel. Mild small bowel dilatation up to approximately 3.4 cm (axial series 1, image 70). The more distal small bowel normally tapers to the  level of the terminal ileum. No evidence of bowel obstruction. Vascular/Lymphatic: No abdominal aortic aneurysm. The major intra-abdominal aortic branch vessels are patent. Mild-to-moderate atherosclerotic calcifications. No mesenteric, retroperitoneal, or pelvic pathologically enlarged lymph nodes by CT criteria. Reproductive: The prostate gland is again nodular and measures up to 5.9 cm in transverse dimension, moderately to markedly enlarged. The seminal vesicles are grossly unremarkable. Other: Moderate left fat containing inguinal hernia. Mild ascites around the liver, within the pericolic gutters, and within the superior pelvis. Trace fluid around the spleen. Mild nondependent upper abdominal pneumoperitoneum consistent with recent surgery. Musculoskeletal: Vertebroplasty cement is again seen within the L1 vertebral body. Moderate to severe multilevel degenerative disc changes. Old mildly displaced posterior left twelfth and eleventh rib fractures, with interval healing/fusion of the posterior left eleventh rib fracture. IMPRESSION: 1. Interval surgery, apparent recent cholecystectomy. Mild fluid and air within the gallbladder fossa and mild pneumoperitoneum, consistent with this recent surgery. 2. Moderate  to high-grade sigmoid and mild descending colon diverticulosis. There is mild postoperative ascites, however otherwise no definite colonic wall inflammatory changes are seen to indicate acute colonic diverticulitis. 3. Moderate fluid within the visualized distal esophagus with moderate fluid within the stomach. Please note given this fluid at the time of the CT scan, the patient may be at increased risk for aspiration. 4. Prostatomegaly. Electronically Signed: By: Yvonne Kendall M.D. On: 02/26/2022 11:10   CT Head Wo Contrast  Result Date: 02/26/2022 CLINICAL DATA:  Neuro deficit, acute, stroke suspected EXAM: CT HEAD WITHOUT CONTRAST TECHNIQUE: Contiguous axial images were obtained from the base of the  skull through the vertex without intravenous contrast. RADIATION DOSE REDUCTION: This exam was performed according to the departmental dose-optimization program which includes automated exposure control, adjustment of the mA and/or kV according to patient size and/or use of iterative reconstruction technique. COMPARISON:  Head CT 08/01/2021. FINDINGS: Brain: No evidence of acute intracranial hemorrhage or abnormal extra-axial collection.There is encephalomalacia in the right frontal and parietal lobes compatible with old infarct. Old left basal ganglia infarct.Ex vacuo dilation of the of lateral ventricles.Chronic small vessel ischemic changes.Mild cerebral atrophy Vascular: Vascular calcifications.  No hyperdense vessel. Skull: Negative for skull fracture. Sinuses/Orbits: Orbits are unremarkable. Paranasal sinuses are predominantly clear. Mastoid air cells are clear. Other: None. IMPRESSION: No acute intracranial abnormality. Chronic right MCA territory and left basal ganglia infarcts. Electronically Signed   By: Maurine Simmering M.D.   On: 02/26/2022 10:53   DG ERCP  Result Date: 02/22/2022 CLINICAL DATA:  Cholelithiasis, choledocholithiasis, pancreatitis and biliary dilatation. EXAM: ERCP TECHNIQUE: Multiple spot images obtained with the fluoroscopic device and submitted for interpretation post-procedure. COMPARISON:  MRI/MRCP on 02/20/2022 and right upper quadrant ultrasound on 02/19/2022 FINDINGS: Images submitted and obtained with a C-arm demonstrate initial cholangiogram showing discrete filling defects within the distal aspect of a dilated common bile duct. There may be some filling defects in the cystic duct. Balloon sweep maneuver was performed with completion cholangiogram showing no further filling defects in the common bile duct. IMPRESSION: ERCP images demonstrate evidence of choledocholithiasis in the distal common bile duct with multiple filling defects noted consistent with calculi. Balloon sweep  maneuver was performed to remove calculi. There may also be some filling defects in the cystic duct. These images were submitted for radiologic interpretation only. Please see the procedural report for the amount of contrast and the fluoroscopy time utilized. Electronically Signed   By: Aletta Edouard M.D.   On: 02/22/2022 13:50   MR ABDOMEN MRCP W WO CONTAST  Result Date: 02/20/2022 CLINICAL DATA:  Cholelithiasis, evaluate for choledocholithiasis EXAM: MRI ABDOMEN WITHOUT AND WITH CONTRAST (INCLUDING MRCP) TECHNIQUE: Multiplanar multisequence MR imaging of the abdomen was performed both before and after the administration of intravenous contrast. Heavily T2-weighted images of the biliary and pancreatic ducts were obtained, and three-dimensional MRCP images were rendered by post processing. CONTRAST:  89m GADAVIST GADOBUTROL 1 MMOL/ML IV SOLN COMPARISON:  Right upper quadrant ultrasound dated 02/19/2022. CT abdomen/pelvis dated 10/02/2021. FINDINGS: Motion degraded images. Lower chest: Lung bases are clear. Hepatobiliary: Liver is unremarkable. Large gallstones, without associated inflammatory changes to suggest acute cholecystitis. No intrahepatic ductal dilatation. Dilated common duct, measuring 8 mm. Multiple mid/distal CBD stones measuring up to 6 mm (series 7/image 20). Pancreas: Grossly unremarkable in this patient with history of acute pancreatitis. No peripancreatic fluid collection/pseudocyst. Spleen:  Within normal limits. Adrenals/Urinary Tract:  Adrenal glands are within normal limits. Bilateral renal cysts, including a dominant  6.1 cm right renal sinus cyst (series 8/image 23), benign (Bosniak I). No hydronephrosis. Stomach/Bowel: Stomach is notable for a small hiatal hernia. Visualized bowel is grossly unremarkable. Vascular/Lymphatic:  No evidence of abdominal aortic aneurysm. No suspicious abdominal lymphadenopathy. Other:  No abdominal ascites. Musculoskeletal: Mild degenerative changes of the  lumbar spine. IMPRESSION: Motion degraded images. Cholelithiasis, without associated inflammatory changes to suggest acute cholecystitis. Choledocholithiasis with multiple mid/distal CBD stones measuring up to 6 mm. ERCP is suggested. No peripancreatic fluid collection/pseudocyst in this patient with reported history of acute pancreatitis. Electronically Signed   By: Julian Hy M.D.   On: 02/20/2022 01:45   US Abdomen Limited RUQ (LIVER/GB)  Result Date: 02/19/2022 CLINICAL DATA:  Pancreatitis in a 81 year old male. Assess for ductal dilation. EXAM: ULTRASOUND ABDOMEN LIMITED RIGHT UPPER QUADRANT COMPARISON:  April 2023 FINDINGS: Gallbladder: The gallbladder is filled with gallstones. Wall thickness is difficult to evaluate given the presence of numerous gallstones. The anterior wall does not appear grossly thickened. The posterior wall cannot be seen due to shadowing. No pericholecystic fluid. Calculi as large as 2 cm or greater, individual stones are difficult to evaluate. No reported tenderness over the gallbladder. Common bile duct: Diameter: 10 mm Liver: Lobular hepatic contours. Signs of fissural widening. Echogenic liver. Portal vein is patent on color Doppler imaging with normal direction of blood flow towards the liver. Other: Renal cysts of the RIGHT kidney are better characterized on previous imaging. These are not fully evaluated on the current exam. Based on previous imaging no dedicated imaging follow-up of this area is recommended. IMPRESSION: Dilation of the common bile duct at 1 cm in the setting of acute pancreatitis based on clinical history. MRCP may be helpful for further evaluation to exclude choledocholithiasis in this patient with extensive cholelithiasis. Gallbladder not well assessed due to extensive cholelithiasis. No reported tenderness over the gallbladder at this time. Electronically Signed   By: Zetta Bills M.D.   On: 02/19/2022 15:12    Microbiology: No results found  for this or any previous visit (from the past 240 hour(s)).   Labs: Basic Metabolic Panel: Recent Labs  Lab 03/07/22 0514 03/08/22 0439 03/09/22 0348 03/10/22 0322 03/11/22 0313 03/12/22 0410  NA 137 137 135 137 138 138  K 3.4* 3.6 3.7 3.7 3.7 3.6  CL 105 104 109 109 107 108  CO2 '23 24 23 25 '$ 21* 26  GLUCOSE 99 105* 118* 103* 104* 102*  BUN 13 9 5* 5* 6* 7*  CREATININE 1.38* 1.34* 1.29* 1.25* 1.39* 1.40*  CALCIUM 7.7* 8.1* 7.4* 7.6* 7.8* 8.0*  MG 1.5*  --   --   --   --   --    Liver Function Tests: Recent Labs  Lab 03/08/22 0439 03/09/22 0348 03/10/22 0322 03/11/22 0313 03/12/22 0410  AST 34 32 '24 22 17  '$ ALT 40 39 32 29 25  ALKPHOS 118 117 116 122 123  BILITOT 0.4 0.4 0.3 0.3 0.4  PROT 4.9* 4.7* 4.6* 4.9* 5.3*  ALBUMIN 1.8* 1.8* 1.8* 1.8* 2.0*   No results for input(s): "LIPASE", "AMYLASE" in the last 168 hours. No results for input(s): "AMMONIA" in the last 168 hours. CBC: Recent Labs  Lab 03/08/22 0439 03/09/22 0348 03/10/22 0322 03/11/22 0313 03/12/22 0410  WBC 8.2 7.5 6.0 6.3 6.2  HGB 7.7* 7.4* 7.2* 7.6* 7.8*  HCT 23.8* 23.0* 22.2* 23.2* 23.6*  MCV 98.8 100.0 98.7 99.6 98.7  PLT 213 185 201 216 242   Cardiac Enzymes: No results  for input(s): "CKTOTAL", "CKMB", "CKMBINDEX", "TROPONINI" in the last 168 hours. BNP: BNP (last 3 results) Recent Labs    02/22/22 0835 02/23/22 0328 02/24/22 0352  BNP 61.6 49.0 61.3    ProBNP (last 3 results) No results for input(s): "PROBNP" in the last 8760 hours.  CBG: No results for input(s): "GLUCAP" in the last 168 hours.     Signed:  Nita Sells MD   [facility] 03/12/2022, 9:05 AM

## 2022-03-12 NOTE — TOC Transition Note (Signed)
Transition of Care [facility]) - CM/SW Discharge Note   Patient Details  Name: William [name] MRN: [id number] Date of Birth: June 24, 1940  Transition of Care [facility]) CM/SW Contact:  Milinda Antis, [place] Phone Number: 03/12/2022, 11:15 AM   Clinical Narrative:    Patient will DC to:  [place] Anticipated DC date:  03/12/2022 Family notified:  Yes Transport by:  Corey Harold   Per MD patient ready for DC to SNF. RN to call report prior to discharge [phone] room 1206P). RN, patient's family, and facility notified of DC. Discharge Summary and FL2 sent to facility. DC packet on chart. Ambulance transport will be requested for patient.   CSW will sign off for now as social work intervention is no longer needed. Please consult [place] again if new needs arise.     Final next level of care: Skilled Nursing Facility Barriers to Discharge: Barriers Resolved   Patient Goals and CMS Choice Patient states their goals for this hospitalization and ongoing recovery are:: To return home CMS Medicare.gov Compare Post Acute Care list provided to:: Patient Choice offered to / list presented to : Patient, Spouse  Discharge Placement              Patient chooses bed at:  [facility]) Patient to be transferred to facility by: [place] Name of family member notified: Zadin, Lange (Spouse)   [phone] Patient and family notified of of transfer: 03/12/22  Discharge Plan and Services   Discharge Planning Services: CM Consult Post Acute Care Choice: Home Health          DME Arranged: N/A DME Agency: NA       HH Arranged: PT, OT (Custodial Services) [place]: [place] ([place]) Date HH Agency Contacted: 03/02/22 Time [place]: [id number] Representative spoke with at [place]: [place] ([place]) Interventions     Readmission Risk Interventions    02/23/2022    1:27 PM  Readmission Risk Prevention Plan  Transportation Screening Complete   PCP or Specialist Appt within 3-5 Days Complete  HRI or [place] Complete  Social Work Consult for [place] Planning/Counseling Complete  Palliative Care Screening Complete  Medication Review [employer]) Complete

## 2022-03-16 ENCOUNTER — Ambulatory Visit (HOSPITAL_COMMUNITY): Payer: PPO | Admitting: Psychiatry

## 2022-03-16 DIAGNOSIS — Z9181 History of falling: Secondary | ICD-10-CM | POA: Diagnosis not present

## 2022-03-16 DIAGNOSIS — N1832 Chronic kidney disease, stage 3b: Secondary | ICD-10-CM | POA: Diagnosis not present

## 2022-03-16 DIAGNOSIS — K567 Ileus, unspecified: Secondary | ICD-10-CM | POA: Diagnosis not present

## 2022-03-16 DIAGNOSIS — Z87448 Personal history of other diseases of urinary system: Secondary | ICD-10-CM | POA: Diagnosis not present

## 2022-03-16 DIAGNOSIS — I69359 Hemiplegia and hemiparesis following cerebral infarction affecting unspecified side: Secondary | ICD-10-CM | POA: Diagnosis not present

## 2022-03-16 DIAGNOSIS — Z8719 Personal history of other diseases of the digestive system: Secondary | ICD-10-CM | POA: Diagnosis not present

## 2022-03-16 DIAGNOSIS — F0390 Unspecified dementia without behavioral disturbance: Secondary | ICD-10-CM | POA: Diagnosis not present

## 2022-03-16 DIAGNOSIS — Z7902 Long term (current) use of antithrombotics/antiplatelets: Secondary | ICD-10-CM | POA: Diagnosis not present

## 2022-03-16 DIAGNOSIS — R1310 Dysphagia, unspecified: Secondary | ICD-10-CM | POA: Diagnosis not present

## 2022-03-16 DIAGNOSIS — F1320 Sedative, hypnotic or anxiolytic dependence, uncomplicated: Secondary | ICD-10-CM | POA: Diagnosis not present

## 2022-03-16 DIAGNOSIS — I82612 Acute embolism and thrombosis of superficial veins of left upper extremity: Secondary | ICD-10-CM | POA: Diagnosis not present

## 2022-03-16 DIAGNOSIS — Z9189 Other specified personal risk factors, not elsewhere classified: Secondary | ICD-10-CM | POA: Diagnosis not present

## 2022-03-16 DIAGNOSIS — Z48815 Encounter for surgical aftercare following surgery on the digestive system: Secondary | ICD-10-CM | POA: Diagnosis not present

## 2022-03-16 DIAGNOSIS — F411 Generalized anxiety disorder: Secondary | ICD-10-CM | POA: Diagnosis not present

## 2022-03-16 DIAGNOSIS — R262 Difficulty in walking, not elsewhere classified: Secondary | ICD-10-CM | POA: Diagnosis not present

## 2022-03-16 DIAGNOSIS — R5381 Other malaise: Secondary | ICD-10-CM | POA: Diagnosis not present

## 2022-03-16 DIAGNOSIS — M6281 Muscle weakness (generalized): Secondary | ICD-10-CM | POA: Diagnosis not present

## 2022-03-18 DIAGNOSIS — N1832 Chronic kidney disease, stage 3b: Secondary | ICD-10-CM | POA: Diagnosis not present

## 2022-03-18 DIAGNOSIS — Z8719 Personal history of other diseases of the digestive system: Secondary | ICD-10-CM | POA: Diagnosis not present

## 2022-03-18 DIAGNOSIS — K5900 Constipation, unspecified: Secondary | ICD-10-CM | POA: Diagnosis not present

## 2022-03-18 DIAGNOSIS — U071 COVID-19: Secondary | ICD-10-CM | POA: Diagnosis not present

## 2022-03-19 DIAGNOSIS — R262 Difficulty in walking, not elsewhere classified: Secondary | ICD-10-CM | POA: Diagnosis not present

## 2022-03-19 DIAGNOSIS — Z1331 Encounter for screening for depression: Secondary | ICD-10-CM | POA: Diagnosis not present

## 2022-03-19 DIAGNOSIS — Z9181 History of falling: Secondary | ICD-10-CM | POA: Diagnosis not present

## 2022-03-19 DIAGNOSIS — F0390 Unspecified dementia without behavioral disturbance: Secondary | ICD-10-CM | POA: Diagnosis not present

## 2022-03-19 DIAGNOSIS — K567 Ileus, unspecified: Secondary | ICD-10-CM | POA: Diagnosis not present

## 2022-03-19 DIAGNOSIS — R5381 Other malaise: Secondary | ICD-10-CM | POA: Diagnosis not present

## 2022-03-19 DIAGNOSIS — K5900 Constipation, unspecified: Secondary | ICD-10-CM | POA: Diagnosis not present

## 2022-03-19 DIAGNOSIS — I69359 Hemiplegia and hemiparesis following cerebral infarction affecting unspecified side: Secondary | ICD-10-CM | POA: Diagnosis not present

## 2022-03-19 DIAGNOSIS — M6281 Muscle weakness (generalized): Secondary | ICD-10-CM | POA: Diagnosis not present

## 2022-03-19 DIAGNOSIS — Z8616 Personal history of COVID-19: Secondary | ICD-10-CM | POA: Diagnosis not present

## 2022-03-19 DIAGNOSIS — I82612 Acute embolism and thrombosis of superficial veins of left upper extremity: Secondary | ICD-10-CM | POA: Diagnosis not present

## 2022-03-19 DIAGNOSIS — F411 Generalized anxiety disorder: Secondary | ICD-10-CM | POA: Diagnosis not present

## 2022-03-19 DIAGNOSIS — U071 COVID-19: Secondary | ICD-10-CM | POA: Diagnosis not present

## 2022-03-19 DIAGNOSIS — D631 Anemia in chronic kidney disease: Secondary | ICD-10-CM | POA: Diagnosis not present

## 2022-03-22 DIAGNOSIS — F411 Generalized anxiety disorder: Secondary | ICD-10-CM | POA: Diagnosis not present

## 2022-03-22 DIAGNOSIS — I69359 Hemiplegia and hemiparesis following cerebral infarction affecting unspecified side: Secondary | ICD-10-CM | POA: Diagnosis not present

## 2022-03-22 DIAGNOSIS — R5381 Other malaise: Secondary | ICD-10-CM | POA: Diagnosis not present

## 2022-03-22 DIAGNOSIS — U071 COVID-19: Secondary | ICD-10-CM | POA: Diagnosis not present

## 2022-03-22 DIAGNOSIS — G20A1 Parkinson's disease without dyskinesia, without mention of fluctuations: Secondary | ICD-10-CM | POA: Diagnosis not present

## 2022-03-22 DIAGNOSIS — R404 Transient alteration of awareness: Secondary | ICD-10-CM | POA: Diagnosis not present

## 2022-03-22 DIAGNOSIS — K5900 Constipation, unspecified: Secondary | ICD-10-CM | POA: Diagnosis not present

## 2022-03-22 DIAGNOSIS — M6281 Muscle weakness (generalized): Secondary | ICD-10-CM | POA: Diagnosis not present

## 2022-03-22 DIAGNOSIS — R262 Difficulty in walking, not elsewhere classified: Secondary | ICD-10-CM | POA: Diagnosis not present

## 2022-03-22 DIAGNOSIS — F0390 Unspecified dementia without behavioral disturbance: Secondary | ICD-10-CM | POA: Diagnosis not present

## 2022-03-22 DIAGNOSIS — Z8616 Personal history of COVID-19: Secondary | ICD-10-CM | POA: Diagnosis not present

## 2022-03-22 DIAGNOSIS — K567 Ileus, unspecified: Secondary | ICD-10-CM | POA: Diagnosis not present

## 2022-03-22 DIAGNOSIS — I82612 Acute embolism and thrombosis of superficial veins of left upper extremity: Secondary | ICD-10-CM | POA: Diagnosis not present

## 2022-03-22 DIAGNOSIS — Z9181 History of falling: Secondary | ICD-10-CM | POA: Diagnosis not present

## 2022-03-25 DIAGNOSIS — I82612 Acute embolism and thrombosis of superficial veins of left upper extremity: Secondary | ICD-10-CM | POA: Diagnosis not present

## 2022-03-25 DIAGNOSIS — F411 Generalized anxiety disorder: Secondary | ICD-10-CM | POA: Diagnosis not present

## 2022-03-25 DIAGNOSIS — K567 Ileus, unspecified: Secondary | ICD-10-CM | POA: Diagnosis not present

## 2022-03-25 DIAGNOSIS — R5381 Other malaise: Secondary | ICD-10-CM | POA: Diagnosis not present

## 2022-03-25 DIAGNOSIS — Z8616 Personal history of COVID-19: Secondary | ICD-10-CM | POA: Diagnosis not present

## 2022-03-25 DIAGNOSIS — M6281 Muscle weakness (generalized): Secondary | ICD-10-CM | POA: Diagnosis not present

## 2022-03-25 DIAGNOSIS — Z9181 History of falling: Secondary | ICD-10-CM | POA: Diagnosis not present

## 2022-03-25 DIAGNOSIS — F0390 Unspecified dementia without behavioral disturbance: Secondary | ICD-10-CM | POA: Diagnosis not present

## 2022-03-25 DIAGNOSIS — R262 Difficulty in walking, not elsewhere classified: Secondary | ICD-10-CM | POA: Diagnosis not present

## 2022-03-25 DIAGNOSIS — I69359 Hemiplegia and hemiparesis following cerebral infarction affecting unspecified side: Secondary | ICD-10-CM | POA: Diagnosis not present

## 2022-03-29 DIAGNOSIS — R5381 Other malaise: Secondary | ICD-10-CM | POA: Diagnosis not present

## 2022-03-29 DIAGNOSIS — R262 Difficulty in walking, not elsewhere classified: Secondary | ICD-10-CM | POA: Diagnosis not present

## 2022-03-29 DIAGNOSIS — K5900 Constipation, unspecified: Secondary | ICD-10-CM | POA: Diagnosis not present

## 2022-03-29 DIAGNOSIS — I82612 Acute embolism and thrombosis of superficial veins of left upper extremity: Secondary | ICD-10-CM | POA: Diagnosis not present

## 2022-03-29 DIAGNOSIS — I69359 Hemiplegia and hemiparesis following cerebral infarction affecting unspecified side: Secondary | ICD-10-CM | POA: Diagnosis not present

## 2022-03-29 DIAGNOSIS — Z8616 Personal history of COVID-19: Secondary | ICD-10-CM | POA: Diagnosis not present

## 2022-03-29 DIAGNOSIS — F411 Generalized anxiety disorder: Secondary | ICD-10-CM | POA: Diagnosis not present

## 2022-03-29 DIAGNOSIS — Z9181 History of falling: Secondary | ICD-10-CM | POA: Diagnosis not present

## 2022-03-29 DIAGNOSIS — M6281 Muscle weakness (generalized): Secondary | ICD-10-CM | POA: Diagnosis not present

## 2022-03-29 DIAGNOSIS — K567 Ileus, unspecified: Secondary | ICD-10-CM | POA: Diagnosis not present

## 2022-03-29 DIAGNOSIS — F0390 Unspecified dementia without behavioral disturbance: Secondary | ICD-10-CM | POA: Diagnosis not present

## 2022-03-31 DIAGNOSIS — I69359 Hemiplegia and hemiparesis following cerebral infarction affecting unspecified side: Secondary | ICD-10-CM | POA: Diagnosis not present

## 2022-03-31 DIAGNOSIS — I82612 Acute embolism and thrombosis of superficial veins of left upper extremity: Secondary | ICD-10-CM | POA: Diagnosis not present

## 2022-03-31 DIAGNOSIS — M6281 Muscle weakness (generalized): Secondary | ICD-10-CM | POA: Diagnosis not present

## 2022-03-31 DIAGNOSIS — K5900 Constipation, unspecified: Secondary | ICD-10-CM | POA: Diagnosis not present

## 2022-03-31 DIAGNOSIS — F0390 Unspecified dementia without behavioral disturbance: Secondary | ICD-10-CM | POA: Diagnosis not present

## 2022-03-31 DIAGNOSIS — Z9181 History of falling: Secondary | ICD-10-CM | POA: Diagnosis not present

## 2022-03-31 DIAGNOSIS — Z8616 Personal history of COVID-19: Secondary | ICD-10-CM | POA: Diagnosis not present

## 2022-03-31 DIAGNOSIS — R262 Difficulty in walking, not elsewhere classified: Secondary | ICD-10-CM | POA: Diagnosis not present

## 2022-03-31 DIAGNOSIS — F411 Generalized anxiety disorder: Secondary | ICD-10-CM | POA: Diagnosis not present

## 2022-03-31 DIAGNOSIS — R5381 Other malaise: Secondary | ICD-10-CM | POA: Diagnosis not present

## 2022-03-31 DIAGNOSIS — K567 Ileus, unspecified: Secondary | ICD-10-CM | POA: Diagnosis not present

## 2022-04-01 ENCOUNTER — Ambulatory Visit: Payer: PPO | Admitting: Podiatry

## 2022-04-01 DIAGNOSIS — G20A1 Parkinson's disease without dyskinesia, without mention of fluctuations: Secondary | ICD-10-CM | POA: Diagnosis not present

## 2022-04-01 DIAGNOSIS — D631 Anemia in chronic kidney disease: Secondary | ICD-10-CM | POA: Diagnosis not present

## 2022-04-01 DIAGNOSIS — N189 Chronic kidney disease, unspecified: Secondary | ICD-10-CM | POA: Diagnosis not present

## 2022-04-01 DIAGNOSIS — R1310 Dysphagia, unspecified: Secondary | ICD-10-CM | POA: Diagnosis not present

## 2022-04-02 DIAGNOSIS — M25512 Pain in left shoulder: Secondary | ICD-10-CM | POA: Diagnosis not present

## 2022-04-05 ENCOUNTER — Encounter: Payer: Self-pay | Admitting: Occupational Therapy

## 2022-04-05 DIAGNOSIS — I69359 Hemiplegia and hemiparesis following cerebral infarction affecting unspecified side: Secondary | ICD-10-CM | POA: Diagnosis not present

## 2022-04-05 DIAGNOSIS — I82612 Acute embolism and thrombosis of superficial veins of left upper extremity: Secondary | ICD-10-CM | POA: Diagnosis not present

## 2022-04-05 DIAGNOSIS — R262 Difficulty in walking, not elsewhere classified: Secondary | ICD-10-CM | POA: Diagnosis not present

## 2022-04-05 DIAGNOSIS — K567 Ileus, unspecified: Secondary | ICD-10-CM | POA: Diagnosis not present

## 2022-04-05 DIAGNOSIS — Z8616 Personal history of COVID-19: Secondary | ICD-10-CM | POA: Diagnosis not present

## 2022-04-05 DIAGNOSIS — F0390 Unspecified dementia without behavioral disturbance: Secondary | ICD-10-CM | POA: Diagnosis not present

## 2022-04-05 DIAGNOSIS — K5900 Constipation, unspecified: Secondary | ICD-10-CM | POA: Diagnosis not present

## 2022-04-05 DIAGNOSIS — F411 Generalized anxiety disorder: Secondary | ICD-10-CM | POA: Diagnosis not present

## 2022-04-05 DIAGNOSIS — Z9181 History of falling: Secondary | ICD-10-CM | POA: Diagnosis not present

## 2022-04-05 DIAGNOSIS — R5381 Other malaise: Secondary | ICD-10-CM | POA: Diagnosis not present

## 2022-04-05 DIAGNOSIS — M6281 Muscle weakness (generalized): Secondary | ICD-10-CM | POA: Diagnosis not present

## 2022-04-05 NOTE — Discharge Summary (Signed)
  OUTPATIENT OCCUPATIONAL THERAPY TREATMENT NOTE     Patient Name: William [name] MRN: [id number] DOB:May 16, 1941, 81 y.o., male Today's Date: 02/16/2022   The above patient had been seen in Occupational Therapy for 31 visits with good consistency attending sessions. The treatment consisted of self care/ADL training, therapeutic exercise, therapeutic activity, neuromuscular re-education, balance training, functional mobility training, patient/family education, cognitive remediation/compensation, visual/perceptual remediation/compensation, and DME and/or AE instructions.  All remaining OP OT appointments were cancelled due to hospitalization and patient has been unable to return since last visit on 02/16/22.   Functional Status at Discharge: Unable to comment/assess as pt has not been since in several weeks due to change in medical status.  Remaining deficits: As of last visit on 02/16/22 - Deficits in executive functioning, functional use of LUE, imbalance, as well as weakness and decreased coordination were limiting to participation and safety in BADLs, IADLs, and functional mobility, also contributing to caregiver strain.   Education / Equipment: Condition-specific education; compensatory strategies; HEP; caregiver/family training   Pt had met 3/3 STGs and 3/6 LTGs at time of last visit - remaining goals were unable to be assessed due to unexpected d/c.     Kathrine Cords, OTR/L, MSOT  04/05/22, 5:12 PM

## 2022-04-07 DIAGNOSIS — F411 Generalized anxiety disorder: Secondary | ICD-10-CM | POA: Diagnosis not present

## 2022-04-07 DIAGNOSIS — K567 Ileus, unspecified: Secondary | ICD-10-CM | POA: Diagnosis not present

## 2022-04-07 DIAGNOSIS — Z9181 History of falling: Secondary | ICD-10-CM | POA: Diagnosis not present

## 2022-04-07 DIAGNOSIS — F0390 Unspecified dementia without behavioral disturbance: Secondary | ICD-10-CM | POA: Diagnosis not present

## 2022-04-07 DIAGNOSIS — R5381 Other malaise: Secondary | ICD-10-CM | POA: Diagnosis not present

## 2022-04-07 DIAGNOSIS — I82612 Acute embolism and thrombosis of superficial veins of left upper extremity: Secondary | ICD-10-CM | POA: Diagnosis not present

## 2022-04-07 DIAGNOSIS — R262 Difficulty in walking, not elsewhere classified: Secondary | ICD-10-CM | POA: Diagnosis not present

## 2022-04-07 DIAGNOSIS — K5900 Constipation, unspecified: Secondary | ICD-10-CM | POA: Diagnosis not present

## 2022-04-07 DIAGNOSIS — I69359 Hemiplegia and hemiparesis following cerebral infarction affecting unspecified side: Secondary | ICD-10-CM | POA: Diagnosis not present

## 2022-04-07 DIAGNOSIS — Z8616 Personal history of COVID-19: Secondary | ICD-10-CM | POA: Diagnosis not present

## 2022-04-07 DIAGNOSIS — M6281 Muscle weakness (generalized): Secondary | ICD-10-CM | POA: Diagnosis not present

## 2022-04-09 ENCOUNTER — Other Ambulatory Visit (HOSPITAL_BASED_OUTPATIENT_CLINIC_OR_DEPARTMENT_OTHER): Payer: Self-pay

## 2022-04-12 ENCOUNTER — Other Ambulatory Visit (HOSPITAL_BASED_OUTPATIENT_CLINIC_OR_DEPARTMENT_OTHER): Payer: Self-pay

## 2022-04-12 DIAGNOSIS — R5381 Other malaise: Secondary | ICD-10-CM | POA: Diagnosis not present

## 2022-04-12 DIAGNOSIS — R262 Difficulty in walking, not elsewhere classified: Secondary | ICD-10-CM | POA: Diagnosis not present

## 2022-04-12 DIAGNOSIS — M6281 Muscle weakness (generalized): Secondary | ICD-10-CM | POA: Diagnosis not present

## 2022-04-12 DIAGNOSIS — Z9181 History of falling: Secondary | ICD-10-CM | POA: Diagnosis not present

## 2022-04-12 DIAGNOSIS — I69359 Hemiplegia and hemiparesis following cerebral infarction affecting unspecified side: Secondary | ICD-10-CM | POA: Diagnosis not present

## 2022-04-12 DIAGNOSIS — Z8616 Personal history of COVID-19: Secondary | ICD-10-CM | POA: Diagnosis not present

## 2022-04-12 DIAGNOSIS — K567 Ileus, unspecified: Secondary | ICD-10-CM | POA: Diagnosis not present

## 2022-04-12 DIAGNOSIS — F411 Generalized anxiety disorder: Secondary | ICD-10-CM | POA: Diagnosis not present

## 2022-04-12 DIAGNOSIS — F0390 Unspecified dementia without behavioral disturbance: Secondary | ICD-10-CM | POA: Diagnosis not present

## 2022-04-12 DIAGNOSIS — I82612 Acute embolism and thrombosis of superficial veins of left upper extremity: Secondary | ICD-10-CM | POA: Diagnosis not present

## 2022-04-12 DIAGNOSIS — K5900 Constipation, unspecified: Secondary | ICD-10-CM | POA: Diagnosis not present

## 2022-04-14 ENCOUNTER — Ambulatory Visit (HOSPITAL_COMMUNITY): Payer: PPO | Admitting: Psychiatry

## 2022-04-14 ENCOUNTER — Encounter (HOSPITAL_COMMUNITY): Payer: Self-pay

## 2022-04-14 DIAGNOSIS — I69359 Hemiplegia and hemiparesis following cerebral infarction affecting unspecified side: Secondary | ICD-10-CM | POA: Diagnosis not present

## 2022-04-14 DIAGNOSIS — K5900 Constipation, unspecified: Secondary | ICD-10-CM | POA: Diagnosis not present

## 2022-04-14 DIAGNOSIS — R2681 Unsteadiness on feet: Secondary | ICD-10-CM | POA: Diagnosis not present

## 2022-04-14 DIAGNOSIS — K567 Ileus, unspecified: Secondary | ICD-10-CM | POA: Diagnosis not present

## 2022-04-14 DIAGNOSIS — Z9181 History of falling: Secondary | ICD-10-CM | POA: Diagnosis not present

## 2022-04-14 DIAGNOSIS — F0390 Unspecified dementia without behavioral disturbance: Secondary | ICD-10-CM | POA: Diagnosis not present

## 2022-04-14 DIAGNOSIS — I82612 Acute embolism and thrombosis of superficial veins of left upper extremity: Secondary | ICD-10-CM | POA: Diagnosis not present

## 2022-04-14 DIAGNOSIS — R5381 Other malaise: Secondary | ICD-10-CM | POA: Diagnosis not present

## 2022-04-14 DIAGNOSIS — M6281 Muscle weakness (generalized): Secondary | ICD-10-CM | POA: Diagnosis not present

## 2022-04-14 DIAGNOSIS — Z8616 Personal history of COVID-19: Secondary | ICD-10-CM | POA: Diagnosis not present

## 2022-04-22 DIAGNOSIS — M6281 Muscle weakness (generalized): Secondary | ICD-10-CM | POA: Diagnosis not present

## 2022-04-22 DIAGNOSIS — K567 Ileus, unspecified: Secondary | ICD-10-CM | POA: Diagnosis not present

## 2022-04-22 DIAGNOSIS — K5900 Constipation, unspecified: Secondary | ICD-10-CM | POA: Diagnosis not present

## 2022-04-22 DIAGNOSIS — Z9181 History of falling: Secondary | ICD-10-CM | POA: Diagnosis not present

## 2022-04-22 DIAGNOSIS — I69359 Hemiplegia and hemiparesis following cerebral infarction affecting unspecified side: Secondary | ICD-10-CM | POA: Diagnosis not present

## 2022-04-22 DIAGNOSIS — R5381 Other malaise: Secondary | ICD-10-CM | POA: Diagnosis not present

## 2022-04-22 DIAGNOSIS — F0390 Unspecified dementia without behavioral disturbance: Secondary | ICD-10-CM | POA: Diagnosis not present

## 2022-04-22 DIAGNOSIS — Z8616 Personal history of COVID-19: Secondary | ICD-10-CM | POA: Diagnosis not present

## 2022-04-22 DIAGNOSIS — R2681 Unsteadiness on feet: Secondary | ICD-10-CM | POA: Diagnosis not present

## 2022-04-22 DIAGNOSIS — I82612 Acute embolism and thrombosis of superficial veins of left upper extremity: Secondary | ICD-10-CM | POA: Diagnosis not present

## 2022-04-23 DIAGNOSIS — I69354 Hemiplegia and hemiparesis following cerebral infarction affecting left non-dominant side: Secondary | ICD-10-CM | POA: Diagnosis not present

## 2022-04-23 DIAGNOSIS — M7502 Adhesive capsulitis of left shoulder: Secondary | ICD-10-CM | POA: Diagnosis not present

## 2022-04-23 DIAGNOSIS — M79602 Pain in left arm: Secondary | ICD-10-CM | POA: Diagnosis not present

## 2022-04-23 DIAGNOSIS — Z872 Personal history of diseases of the skin and subcutaneous tissue: Secondary | ICD-10-CM | POA: Diagnosis not present

## 2022-04-27 DIAGNOSIS — R5381 Other malaise: Secondary | ICD-10-CM | POA: Diagnosis not present

## 2022-04-27 DIAGNOSIS — M6281 Muscle weakness (generalized): Secondary | ICD-10-CM | POA: Diagnosis not present

## 2022-04-27 DIAGNOSIS — I82612 Acute embolism and thrombosis of superficial veins of left upper extremity: Secondary | ICD-10-CM | POA: Diagnosis not present

## 2022-04-27 DIAGNOSIS — K567 Ileus, unspecified: Secondary | ICD-10-CM | POA: Diagnosis not present

## 2022-04-27 DIAGNOSIS — I69359 Hemiplegia and hemiparesis following cerebral infarction affecting unspecified side: Secondary | ICD-10-CM | POA: Diagnosis not present

## 2022-04-27 DIAGNOSIS — K5900 Constipation, unspecified: Secondary | ICD-10-CM | POA: Diagnosis not present

## 2022-04-27 DIAGNOSIS — Z9181 History of falling: Secondary | ICD-10-CM | POA: Diagnosis not present

## 2022-04-27 DIAGNOSIS — F0390 Unspecified dementia without behavioral disturbance: Secondary | ICD-10-CM | POA: Diagnosis not present

## 2022-04-27 DIAGNOSIS — R2681 Unsteadiness on feet: Secondary | ICD-10-CM | POA: Diagnosis not present

## 2022-04-27 DIAGNOSIS — Z8616 Personal history of COVID-19: Secondary | ICD-10-CM | POA: Diagnosis not present

## 2022-04-28 ENCOUNTER — Other Ambulatory Visit (HOSPITAL_BASED_OUTPATIENT_CLINIC_OR_DEPARTMENT_OTHER): Payer: Self-pay

## 2022-04-29 DIAGNOSIS — K567 Ileus, unspecified: Secondary | ICD-10-CM | POA: Diagnosis not present

## 2022-04-29 DIAGNOSIS — I69359 Hemiplegia and hemiparesis following cerebral infarction affecting unspecified side: Secondary | ICD-10-CM | POA: Diagnosis not present

## 2022-04-29 DIAGNOSIS — R2681 Unsteadiness on feet: Secondary | ICD-10-CM | POA: Diagnosis not present

## 2022-04-29 DIAGNOSIS — Z8616 Personal history of COVID-19: Secondary | ICD-10-CM | POA: Diagnosis not present

## 2022-04-29 DIAGNOSIS — F0390 Unspecified dementia without behavioral disturbance: Secondary | ICD-10-CM | POA: Diagnosis not present

## 2022-04-29 DIAGNOSIS — M6281 Muscle weakness (generalized): Secondary | ICD-10-CM | POA: Diagnosis not present

## 2022-04-29 DIAGNOSIS — R5381 Other malaise: Secondary | ICD-10-CM | POA: Diagnosis not present

## 2022-04-29 DIAGNOSIS — I82612 Acute embolism and thrombosis of superficial veins of left upper extremity: Secondary | ICD-10-CM | POA: Diagnosis not present

## 2022-04-29 DIAGNOSIS — Z9181 History of falling: Secondary | ICD-10-CM | POA: Diagnosis not present

## 2022-04-29 DIAGNOSIS — K5900 Constipation, unspecified: Secondary | ICD-10-CM | POA: Diagnosis not present

## 2022-04-30 DIAGNOSIS — D631 Anemia in chronic kidney disease: Secondary | ICD-10-CM | POA: Diagnosis not present

## 2022-04-30 DIAGNOSIS — F1320 Sedative, hypnotic or anxiolytic dependence, uncomplicated: Secondary | ICD-10-CM | POA: Diagnosis not present

## 2022-04-30 DIAGNOSIS — M7502 Adhesive capsulitis of left shoulder: Secondary | ICD-10-CM | POA: Diagnosis not present

## 2022-04-30 DIAGNOSIS — G20A1 Parkinson's disease without dyskinesia, without mention of fluctuations: Secondary | ICD-10-CM | POA: Diagnosis not present

## 2022-05-03 ENCOUNTER — Other Ambulatory Visit (HOSPITAL_BASED_OUTPATIENT_CLINIC_OR_DEPARTMENT_OTHER): Payer: Self-pay

## 2022-05-03 DIAGNOSIS — I69359 Hemiplegia and hemiparesis following cerebral infarction affecting unspecified side: Secondary | ICD-10-CM | POA: Diagnosis not present

## 2022-05-03 DIAGNOSIS — Z8616 Personal history of COVID-19: Secondary | ICD-10-CM | POA: Diagnosis not present

## 2022-05-03 DIAGNOSIS — F0390 Unspecified dementia without behavioral disturbance: Secondary | ICD-10-CM | POA: Diagnosis not present

## 2022-05-03 DIAGNOSIS — I82612 Acute embolism and thrombosis of superficial veins of left upper extremity: Secondary | ICD-10-CM | POA: Diagnosis not present

## 2022-05-03 DIAGNOSIS — R5381 Other malaise: Secondary | ICD-10-CM | POA: Diagnosis not present

## 2022-05-03 DIAGNOSIS — K5900 Constipation, unspecified: Secondary | ICD-10-CM | POA: Diagnosis not present

## 2022-05-03 DIAGNOSIS — Z9181 History of falling: Secondary | ICD-10-CM | POA: Diagnosis not present

## 2022-05-03 DIAGNOSIS — M6281 Muscle weakness (generalized): Secondary | ICD-10-CM | POA: Diagnosis not present

## 2022-05-03 DIAGNOSIS — K567 Ileus, unspecified: Secondary | ICD-10-CM | POA: Diagnosis not present

## 2022-05-03 DIAGNOSIS — R2681 Unsteadiness on feet: Secondary | ICD-10-CM | POA: Diagnosis not present

## 2022-05-03 MED ORDER — LOREEV XR 1 MG PO CS24
1.0000 mg | EXTENDED_RELEASE_CAPSULE | Freq: Every day | ORAL | 0 refills | Status: DC
  Filled 2022-05-03 – 2022-05-07 (×2): qty 7, 7d supply, fill #0

## 2022-05-03 MED ORDER — LORAZEPAM 0.5 MG PO TABS
0.2500 mg | ORAL_TABLET | Freq: Every morning | ORAL | 0 refills | Status: DC
  Filled 2022-05-03: qty 4, 8d supply, fill #0

## 2022-05-04 ENCOUNTER — Telehealth: Payer: Self-pay | Admitting: Family Medicine

## 2022-05-04 DIAGNOSIS — I69818 Other symptoms and signs involving cognitive functions following other cerebrovascular disease: Secondary | ICD-10-CM | POA: Diagnosis not present

## 2022-05-04 DIAGNOSIS — D649 Anemia, unspecified: Secondary | ICD-10-CM | POA: Diagnosis not present

## 2022-05-04 DIAGNOSIS — I129 Hypertensive chronic kidney disease with stage 1 through stage 4 chronic kidney disease, or unspecified chronic kidney disease: Secondary | ICD-10-CM | POA: Diagnosis not present

## 2022-05-04 DIAGNOSIS — F01A0 Vascular dementia, mild, without behavioral disturbance, psychotic disturbance, mood disturbance, and anxiety: Secondary | ICD-10-CM | POA: Diagnosis not present

## 2022-05-04 DIAGNOSIS — N1832 Chronic kidney disease, stage 3b: Secondary | ICD-10-CM | POA: Diagnosis not present

## 2022-05-04 DIAGNOSIS — K219 Gastro-esophageal reflux disease without esophagitis: Secondary | ICD-10-CM | POA: Diagnosis not present

## 2022-05-04 DIAGNOSIS — I69354 Hemiplegia and hemiparesis following cerebral infarction affecting left non-dominant side: Secondary | ICD-10-CM | POA: Diagnosis not present

## 2022-05-04 DIAGNOSIS — R531 Weakness: Secondary | ICD-10-CM | POA: Diagnosis not present

## 2022-05-04 DIAGNOSIS — K567 Ileus, unspecified: Secondary | ICD-10-CM | POA: Diagnosis not present

## 2022-05-04 DIAGNOSIS — R1310 Dysphagia, unspecified: Secondary | ICD-10-CM | POA: Diagnosis not present

## 2022-05-04 NOTE — Telephone Encounter (Signed)
Verbal given 

## 2022-05-04 NOTE — Telephone Encounter (Signed)
Caller/Agency: Constance [facility]) Callback Number: [phone], ok to LVM Requesting OT/PT/Skilled Nursing/Social Work/Speech Therapy: PT Frequency: 2 w 4, 1 w 4 starting next week

## 2022-05-05 DIAGNOSIS — K567 Ileus, unspecified: Secondary | ICD-10-CM | POA: Diagnosis not present

## 2022-05-07 ENCOUNTER — Other Ambulatory Visit (HOSPITAL_BASED_OUTPATIENT_CLINIC_OR_DEPARTMENT_OTHER): Payer: Self-pay

## 2022-05-07 DIAGNOSIS — K567 Ileus, unspecified: Secondary | ICD-10-CM | POA: Diagnosis not present

## 2022-05-10 ENCOUNTER — Telehealth: Payer: Self-pay | Admitting: Family Medicine

## 2022-05-10 DIAGNOSIS — K567 Ileus, unspecified: Secondary | ICD-10-CM | POA: Diagnosis not present

## 2022-05-10 NOTE — Telephone Encounter (Signed)
Verbal given. FYI 

## 2022-05-10 NOTE — Telephone Encounter (Signed)
[facility]) called to get verbal orders for a nurse to go out to evaluate a sore located on pt's sacrum.

## 2022-05-11 ENCOUNTER — Telehealth: Payer: Self-pay | Admitting: Neurology

## 2022-05-11 ENCOUNTER — Telehealth: Payer: Self-pay | Admitting: *Deleted

## 2022-05-11 DIAGNOSIS — I129 Hypertensive chronic kidney disease with stage 1 through stage 4 chronic kidney disease, or unspecified chronic kidney disease: Secondary | ICD-10-CM | POA: Diagnosis not present

## 2022-05-11 DIAGNOSIS — R7303 Prediabetes: Secondary | ICD-10-CM | POA: Diagnosis not present

## 2022-05-11 DIAGNOSIS — C679 Malignant neoplasm of bladder, unspecified: Secondary | ICD-10-CM | POA: Diagnosis not present

## 2022-05-11 DIAGNOSIS — E559 Vitamin D deficiency, unspecified: Secondary | ICD-10-CM | POA: Diagnosis not present

## 2022-05-11 DIAGNOSIS — D631 Anemia in chronic kidney disease: Secondary | ICD-10-CM | POA: Diagnosis not present

## 2022-05-11 DIAGNOSIS — Z8673 Personal history of transient ischemic attack (TIA), and cerebral infarction without residual deficits: Secondary | ICD-10-CM | POA: Diagnosis not present

## 2022-05-11 DIAGNOSIS — N189 Chronic kidney disease, unspecified: Secondary | ICD-10-CM | POA: Diagnosis not present

## 2022-05-11 DIAGNOSIS — I10 Essential (primary) hypertension: Secondary | ICD-10-CM | POA: Diagnosis not present

## 2022-05-11 DIAGNOSIS — I679 Cerebrovascular disease, unspecified: Secondary | ICD-10-CM | POA: Diagnosis not present

## 2022-05-11 DIAGNOSIS — N1832 Chronic kidney disease, stage 3b: Secondary | ICD-10-CM | POA: Diagnosis not present

## 2022-05-11 NOTE — Telephone Encounter (Signed)
Who Is Calling Patient / Member / Family / Caregiver Caller Name Christa See Phone Number [phone] Patient Name William [name] Patient DOB 09-Sep-1940 Call Type Message Only Information Provided Reason for Call Request for General Office Information Initial Comment Caller states they were calling about pt William [name] DOB [birth year] and he does not have a wound on his rectum. Magda Paganini call back [phone] Disp. Time Disposition Final User 05/11/2022 2:53:32 PM General Information Provided Yes Marice Potter

## 2022-05-11 NOTE — Telephone Encounter (Signed)
Pt's wife called in stating the pt has been in Rehab after surgery for a few months. Since he came home he has been very combative at night and has "sun downers" pretty bad. She is wondering if there could be anything he could take for this? 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

## 2022-05-12 ENCOUNTER — Other Ambulatory Visit (HOSPITAL_BASED_OUTPATIENT_CLINIC_OR_DEPARTMENT_OTHER): Payer: Self-pay

## 2022-05-12 ENCOUNTER — Other Ambulatory Visit: Payer: Self-pay | Admitting: Psychiatry

## 2022-05-12 DIAGNOSIS — K567 Ileus, unspecified: Secondary | ICD-10-CM | POA: Diagnosis not present

## 2022-05-13 ENCOUNTER — Other Ambulatory Visit (HOSPITAL_COMMUNITY): Payer: Self-pay | Admitting: Psychiatry

## 2022-05-13 ENCOUNTER — Other Ambulatory Visit (HOSPITAL_COMMUNITY): Payer: Self-pay | Admitting: *Deleted

## 2022-05-13 ENCOUNTER — Other Ambulatory Visit (HOSPITAL_BASED_OUTPATIENT_CLINIC_OR_DEPARTMENT_OTHER): Payer: Self-pay

## 2022-05-13 DIAGNOSIS — F0630 Mood disorder due to known physiological condition, unspecified: Secondary | ICD-10-CM

## 2022-05-13 MED ORDER — CARBAMAZEPINE 100 MG/5ML PO SUSP: ORAL | 0 refills | Status: DC

## 2022-05-13 MED ORDER — CARBAMAZEPINE 100 MG/5ML PO SUSP
400.0000 mg | Freq: Every day | ORAL | 0 refills | Status: DC
  Filled 2022-05-13: qty 60, 3d supply, fill #0

## 2022-05-13 NOTE — Telephone Encounter (Signed)
Patient wife is calling to see the status of this message please call patient wife

## 2022-05-14 ENCOUNTER — Telehealth: Payer: Self-pay | Admitting: Family Medicine

## 2022-05-14 ENCOUNTER — Other Ambulatory Visit (HOSPITAL_BASED_OUTPATIENT_CLINIC_OR_DEPARTMENT_OTHER): Payer: Self-pay

## 2022-05-14 DIAGNOSIS — I129 Hypertensive chronic kidney disease with stage 1 through stage 4 chronic kidney disease, or unspecified chronic kidney disease: Secondary | ICD-10-CM | POA: Diagnosis not present

## 2022-05-14 DIAGNOSIS — K567 Ileus, unspecified: Secondary | ICD-10-CM | POA: Diagnosis not present

## 2022-05-14 DIAGNOSIS — I69818 Other symptoms and signs involving cognitive functions following other cerebrovascular disease: Secondary | ICD-10-CM | POA: Diagnosis not present

## 2022-05-14 DIAGNOSIS — R531 Weakness: Secondary | ICD-10-CM | POA: Diagnosis not present

## 2022-05-14 DIAGNOSIS — S42002A Fracture of unspecified part of left clavicle, initial encounter for closed fracture: Secondary | ICD-10-CM | POA: Diagnosis not present

## 2022-05-14 DIAGNOSIS — K219 Gastro-esophageal reflux disease without esophagitis: Secondary | ICD-10-CM | POA: Diagnosis not present

## 2022-05-14 DIAGNOSIS — R1310 Dysphagia, unspecified: Secondary | ICD-10-CM | POA: Diagnosis not present

## 2022-05-14 DIAGNOSIS — N1832 Chronic kidney disease, stage 3b: Secondary | ICD-10-CM | POA: Diagnosis not present

## 2022-05-14 DIAGNOSIS — I69354 Hemiplegia and hemiparesis following cerebral infarction affecting left non-dominant side: Secondary | ICD-10-CM | POA: Diagnosis not present

## 2022-05-14 DIAGNOSIS — F01A0 Vascular dementia, mild, without behavioral disturbance, psychotic disturbance, mood disturbance, and anxiety: Secondary | ICD-10-CM | POA: Diagnosis not present

## 2022-05-14 DIAGNOSIS — D649 Anemia, unspecified: Secondary | ICD-10-CM | POA: Diagnosis not present

## 2022-05-14 NOTE — Telephone Encounter (Signed)
Caller/Agency: Maryan Rued [place] Number: [phone] Requesting OT/PT/Skilled Nursing/Social Work/Speech Therapy: OT  Frequency: 2x6 , 1x1

## 2022-05-14 NOTE — Telephone Encounter (Signed)
Per Dr.Jaffe, Typically we use neuroleptic medications.  But they come with potentially cardiac side effects and may cause serious side effects in conjunction with the other medications prescribed by Dr. Casimiro Needle.  Therefore, it would probably be better that they address management with Dr. Casimiro Needle.

## 2022-05-14 NOTE — Telephone Encounter (Signed)
Pt called in and left a message returning Renee's call

## 2022-05-14 NOTE — Telephone Encounter (Signed)
Verbal given 

## 2022-05-14 NOTE — Telephone Encounter (Signed)
Called and left message to call office

## 2022-05-15 ENCOUNTER — Encounter: Payer: Self-pay | Admitting: Family Medicine

## 2022-05-17 ENCOUNTER — Other Ambulatory Visit: Payer: Self-pay | Admitting: Family Medicine

## 2022-05-17 ENCOUNTER — Other Ambulatory Visit (HOSPITAL_BASED_OUTPATIENT_CLINIC_OR_DEPARTMENT_OTHER): Payer: Self-pay

## 2022-05-17 MED ORDER — CARBAMAZEPINE 100 MG/5ML PO SUSP
400.0000 mg | Freq: Every day | ORAL | 3 refills | Status: DC
  Filled 2022-05-17: qty 600, 30d supply, fill #0
  Filled 2022-06-13: qty 600, 30d supply, fill #1
  Filled 2022-06-16: qty 150, 7d supply, fill #1
  Filled 2022-06-16: qty 450, 23d supply, fill #1
  Filled 2022-06-16: qty 150, 7d supply, fill #1

## 2022-05-17 NOTE — Telephone Encounter (Signed)
It doesn't look like you rx this medication. Please advise

## 2022-05-18 ENCOUNTER — Other Ambulatory Visit (HOSPITAL_BASED_OUTPATIENT_CLINIC_OR_DEPARTMENT_OTHER): Payer: Self-pay

## 2022-05-18 DIAGNOSIS — E785 Hyperlipidemia, unspecified: Secondary | ICD-10-CM | POA: Diagnosis not present

## 2022-05-18 DIAGNOSIS — R531 Weakness: Secondary | ICD-10-CM | POA: Diagnosis not present

## 2022-05-18 DIAGNOSIS — I69818 Other symptoms and signs involving cognitive functions following other cerebrovascular disease: Secondary | ICD-10-CM | POA: Diagnosis not present

## 2022-05-18 DIAGNOSIS — D649 Anemia, unspecified: Secondary | ICD-10-CM | POA: Diagnosis not present

## 2022-05-18 DIAGNOSIS — F01A0 Vascular dementia, mild, without behavioral disturbance, psychotic disturbance, mood disturbance, and anxiety: Secondary | ICD-10-CM | POA: Diagnosis not present

## 2022-05-18 DIAGNOSIS — K567 Ileus, unspecified: Secondary | ICD-10-CM | POA: Diagnosis not present

## 2022-05-18 DIAGNOSIS — K219 Gastro-esophageal reflux disease without esophagitis: Secondary | ICD-10-CM | POA: Diagnosis not present

## 2022-05-18 DIAGNOSIS — Z8551 Personal history of malignant neoplasm of bladder: Secondary | ICD-10-CM | POA: Diagnosis not present

## 2022-05-18 DIAGNOSIS — R1310 Dysphagia, unspecified: Secondary | ICD-10-CM | POA: Diagnosis not present

## 2022-05-18 DIAGNOSIS — I69354 Hemiplegia and hemiparesis following cerebral infarction affecting left non-dominant side: Secondary | ICD-10-CM | POA: Diagnosis not present

## 2022-05-18 DIAGNOSIS — I129 Hypertensive chronic kidney disease with stage 1 through stage 4 chronic kidney disease, or unspecified chronic kidney disease: Secondary | ICD-10-CM | POA: Diagnosis not present

## 2022-05-18 DIAGNOSIS — Z7902 Long term (current) use of antithrombotics/antiplatelets: Secondary | ICD-10-CM

## 2022-05-18 DIAGNOSIS — N1832 Chronic kidney disease, stage 3b: Secondary | ICD-10-CM | POA: Diagnosis not present

## 2022-05-18 LAB — BASIC METABOLIC PANEL
BUN: 21 (ref 4–21)
CO2: 22 (ref 13–22)
Chloride: 103 (ref 99–108)
Creatinine: 1.4 — AB (ref 0.6–1.3)
Glucose: 111
Potassium: 3.9 mEq/L (ref 3.5–5.1)
Sodium: 145 (ref 137–147)

## 2022-05-18 LAB — CBC AND DIFFERENTIAL
HCT: 39 — AB (ref 41–53)
Hemoglobin: 12.8 — AB (ref 13.5–17.5)
Neutrophils Absolute: 4.9
Platelets: 240 10*3/uL (ref 150–400)
WBC: 6.8

## 2022-05-18 LAB — IRON,TIBC AND FERRITIN PANEL
%SAT: 46
Ferritin: 118
Iron: 109
TIBC: 238
UIBC: 129

## 2022-05-18 LAB — COMPREHENSIVE METABOLIC PANEL
Albumin: 4 (ref 3.5–5.0)
Calcium: 9.5 (ref 8.7–10.7)
eGFR: 49

## 2022-05-18 LAB — CBC: RBC: 4.07 (ref 3.87–5.11)

## 2022-05-18 LAB — VITAMIN D 25 HYDROXY (VIT D DEFICIENCY, FRACTURES): Vit D, 25-Hydroxy: 46.2

## 2022-05-19 ENCOUNTER — Ambulatory Visit (HOSPITAL_COMMUNITY): Payer: PPO | Admitting: Psychiatry

## 2022-05-19 ENCOUNTER — Other Ambulatory Visit (HOSPITAL_BASED_OUTPATIENT_CLINIC_OR_DEPARTMENT_OTHER): Payer: Self-pay

## 2022-05-19 DIAGNOSIS — Z79899 Other long term (current) drug therapy: Secondary | ICD-10-CM

## 2022-05-19 DIAGNOSIS — F0630 Mood disorder due to known physiological condition, unspecified: Secondary | ICD-10-CM | POA: Diagnosis not present

## 2022-05-19 DIAGNOSIS — Z5181 Encounter for therapeutic drug level monitoring: Secondary | ICD-10-CM

## 2022-05-19 MED ORDER — LOREEV XR 1 MG PO CS24
1.0000 mg | EXTENDED_RELEASE_CAPSULE | Freq: Every day | ORAL | 4 refills | Status: DC
  Filled 2022-05-19: qty 30, 30d supply, fill #0

## 2022-05-19 MED ORDER — LORAZEPAM 0.5 MG PO TABS
0.7500 mg | ORAL_TABLET | Freq: Every day | ORAL | 4 refills | Status: DC
  Filled 2022-05-19: qty 50, 30d supply, fill #0

## 2022-05-19 MED ORDER — LOREEV XR 1 MG PO CS24
1.0000 mg | EXTENDED_RELEASE_CAPSULE | Freq: Every day | ORAL | 4 refills | Status: DC
  Filled 2022-05-19 – 2022-05-24 (×2): qty 30, 30d supply, fill #0
  Filled 2022-06-20: qty 30, 30d supply, fill #1
  Filled 2022-08-20: qty 30, 30d supply, fill #2
  Filled 2022-09-19: qty 30, 30d supply, fill #3

## 2022-05-19 MED ORDER — LOREEV XR 1 MG PO CS24: 1.0000 mg | EXTENDED_RELEASE_CAPSULE | Freq: Every day | ORAL | 0 refills | Status: DC

## 2022-05-19 NOTE — Progress Notes (Signed)
Today is 05/19/2022.  The patient was seen with his wife [name] and his daughter [name].  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  But over the last month or 2 he has had a number of other medical problems.  He had gallstones that had his gallbladder removed.  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  He is taking a number of months to recover and finally he is return home for the last few weeks.  He has assistance in his home.  The patient still has the symptoms of sundowning and getting irritable screaming and yelling and agitated towards the end of the day.  During the rest of the day during the morning he seems to be fairly cooperative and calm.  He continues taking trazodone for sleep.  His wife is hesitant to give him Vistaril as she thinks it will affect his kidneys.  The patient takes 25 mg of Ativan first thing in the morning and then he takes a Loorev 1 mg in the evening.  At this time we are unsure of how much Tegretol is in his blood system.  He receives a liquid dose of it first thing in the morning 1 midday and 2 at night.  It sounds like his total dose for 24 hours is 400 mg.  We however do not have a Tegretol level.  We will go ahead and order a Tegretol level and some other blood work to be done within the next week.  The possibility of increasing it should be considered.  Overall the patient does not seem to be distressed at all.  Physically he is very limited.  His wife is very dedicated to him.    Assessment/plan  At this time the patient will continue taking Ativan but we will increase the dose.  He will take 0.5 mg half in the morning and one of the 0.5 at about 5:00 at the end of the day.  This is when he starts getting sundowning.  We will continue taking the other long-acting lorazepam every day.  Will continue the same dose of Tegretol but we will go ahead and get a blood level.  We will adjust the Tegretol as indicated.  He will return to see me in approximately 4 to 5 weeks.

## 2022-05-20 ENCOUNTER — Other Ambulatory Visit (HOSPITAL_BASED_OUTPATIENT_CLINIC_OR_DEPARTMENT_OTHER): Payer: Self-pay

## 2022-05-20 DIAGNOSIS — G9341 Metabolic encephalopathy: Secondary | ICD-10-CM | POA: Diagnosis not present

## 2022-05-20 DIAGNOSIS — I6381 Other cerebral infarction due to occlusion or stenosis of small artery: Secondary | ICD-10-CM | POA: Diagnosis not present

## 2022-05-20 DIAGNOSIS — S2249XA Multiple fractures of ribs, unspecified side, initial encounter for closed fracture: Secondary | ICD-10-CM | POA: Diagnosis not present

## 2022-05-20 DIAGNOSIS — N184 Chronic kidney disease, stage 4 (severe): Secondary | ICD-10-CM | POA: Diagnosis not present

## 2022-05-20 DIAGNOSIS — M47816 Spondylosis without myelopathy or radiculopathy, lumbar region: Secondary | ICD-10-CM | POA: Diagnosis not present

## 2022-05-21 ENCOUNTER — Telehealth: Payer: Self-pay | Admitting: Family Medicine

## 2022-05-21 DIAGNOSIS — G9341 Metabolic encephalopathy: Secondary | ICD-10-CM | POA: Diagnosis not present

## 2022-05-21 DIAGNOSIS — M47816 Spondylosis without myelopathy or radiculopathy, lumbar region: Secondary | ICD-10-CM | POA: Diagnosis not present

## 2022-05-21 DIAGNOSIS — I6381 Other cerebral infarction due to occlusion or stenosis of small artery: Secondary | ICD-10-CM | POA: Diagnosis not present

## 2022-05-21 DIAGNOSIS — G20C Parkinsonism, unspecified: Secondary | ICD-10-CM | POA: Diagnosis not present

## 2022-05-21 DIAGNOSIS — N184 Chronic kidney disease, stage 4 (severe): Secondary | ICD-10-CM | POA: Diagnosis not present

## 2022-05-21 DIAGNOSIS — S2249XA Multiple fractures of ribs, unspecified side, initial encounter for closed fracture: Secondary | ICD-10-CM | POA: Diagnosis not present

## 2022-05-21 NOTE — Telephone Encounter (Signed)
William [name] with [facility] called to give a fall report. Patient fell backwards while using the bathroom. No injuries or bruising right now. EMS did have to come get him back up on his feet.

## 2022-05-24 ENCOUNTER — Other Ambulatory Visit (HOSPITAL_BASED_OUTPATIENT_CLINIC_OR_DEPARTMENT_OTHER): Payer: Self-pay

## 2022-05-24 ENCOUNTER — Other Ambulatory Visit: Payer: Self-pay

## 2022-05-25 ENCOUNTER — Encounter: Payer: Self-pay | Admitting: *Deleted

## 2022-05-25 ENCOUNTER — Encounter (HOSPITAL_BASED_OUTPATIENT_CLINIC_OR_DEPARTMENT_OTHER): Payer: Self-pay

## 2022-05-25 ENCOUNTER — Other Ambulatory Visit (HOSPITAL_BASED_OUTPATIENT_CLINIC_OR_DEPARTMENT_OTHER): Payer: Self-pay

## 2022-05-25 NOTE — Progress Notes (Signed)
[facility] Quality Team Note  Name: William [name] Date of Birth: 1940/09/24 MRN: [id number] Date: 05/25/2022  [facility] Quality Team has reviewed this patient's chart, please see recommendations below:  TRC-MRP; Patient was recently discharged from hospital on (from outpatient rehab following hospitalization 05/03/2022). Please schedule office visit or telephone call to complete medication reconciliation prior to MRP deadline (06/02/2022).

## 2022-05-26 ENCOUNTER — Other Ambulatory Visit (HOSPITAL_BASED_OUTPATIENT_CLINIC_OR_DEPARTMENT_OTHER): Payer: Self-pay

## 2022-05-28 ENCOUNTER — Ambulatory Visit (INDEPENDENT_AMBULATORY_CARE_PROVIDER_SITE_OTHER): Payer: PPO | Admitting: Family Medicine

## 2022-05-28 VITALS — BP 142/78 | HR 69 | Temp 98.5°F | Resp 18 | Ht 71.0 in

## 2022-05-28 DIAGNOSIS — I6381 Other cerebral infarction due to occlusion or stenosis of small artery: Secondary | ICD-10-CM

## 2022-05-28 DIAGNOSIS — I10 Essential (primary) hypertension: Secondary | ICD-10-CM

## 2022-05-28 DIAGNOSIS — R4182 Altered mental status, unspecified: Secondary | ICD-10-CM | POA: Diagnosis not present

## 2022-05-28 DIAGNOSIS — N171 Acute kidney failure with acute cortical necrosis: Secondary | ICD-10-CM | POA: Diagnosis not present

## 2022-05-28 DIAGNOSIS — E785 Hyperlipidemia, unspecified: Secondary | ICD-10-CM

## 2022-05-28 DIAGNOSIS — I69359 Hemiplegia and hemiparesis following cerebral infarction affecting unspecified side: Secondary | ICD-10-CM

## 2022-05-28 DIAGNOSIS — N1832 Chronic kidney disease, stage 3b: Secondary | ICD-10-CM

## 2022-05-28 DIAGNOSIS — Z79899 Other long term (current) drug therapy: Secondary | ICD-10-CM

## 2022-05-28 DIAGNOSIS — I639 Cerebral infarction, unspecified: Secondary | ICD-10-CM

## 2022-05-29 ENCOUNTER — Encounter: Payer: Self-pay | Admitting: Family Medicine

## 2022-05-29 LAB — CARBAMAZEPINE LEVEL, TOTAL: Carbamazepine Lvl: 6.1 mg/L (ref 4.0–12.0)

## 2022-05-29 NOTE — Progress Notes (Signed)
Established Patient Office Visit  Subjective   Patient ID: William [name], male    DOB: [birth year]  Age: 81 y.o. MRN: [id number]  Chief Complaint  Patient presents with   Follow-up    HPI Pt is here to f/u d/c from rehab for stroke.  He is currently getting pt at home and they would like to con't with outpt pt after he is done  his wife is with him today Patient Active Problem List   Diagnosis Date Noted   Postoperative ileus ([place]) 02/27/2022   Ileus, postoperative ([place]) 02/26/2022   Choledocholithiasis 02/19/2022   DNR (do not resuscitate) 02/19/2022   Left elbow pain 01/26/2022   Mid back pain on left side 01/26/2022   Rib pain 01/26/2022   Acute pain of left shoulder 11/12/2021   Leukocytes in urine 11/12/2021   Urinary frequency 11/12/2021   Thrush 10/08/2021   Hemiplegia, dominant side S/P CVA (cerebrovascular accident) ([place]) 09/11/2021   Insomnia    Prediabetes    Acute 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 ([place])    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/29/2021   Transaminitis 07/27/2021   UTI (urinary tract infection) 07/27/2021   CVA (cerebral vascular accident) ([place]) 07/27/2021   Fall 07/27/2021   Hyperglycemia 07/27/2021   Cholelithiasis 07/27/2021   Hypoxia 07/27/2021   Nausea and vomiting 37/90/2409   Acute metabolic encephalopathy 73/53/2992   Normocytic anemia 07/27/2021   Chronic back pain 07/27/2021   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[place]) 06/22/2021   Closed fracture of first lumbar vertebra with routine healing 02/03/2021   Closed fracture of multiple ribs 11/18/2020   Anxiety 01/29/2020   Leg pain, bilateral 01/29/2020   Ingrown toenail 07/13/2019   Lumbar spondylosis 05/02/2018   Pain in left knee 03/09/2018   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   Trochanteric bursitis of left hip 01/16/2018   Preventative health care 09/26/2017   HTN (hypertension) 07/19/2015   Hyperlipidemia 07/19/2015   Great toe  pain 02/11/2014   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   Obesity (BMI 30-39.9) 06/25/2013   Renal insufficiency 06/25/2013   Weakness of left arm 06/25/2013   Sebaceous cyst 03/03/2011   Sprain of lumbar region 07/31/2010   Rib pain, left 08/29/2009   Carotid artery stenosis, asymptomatic, bilateral 05/02/2009   Eczema, atopic 05/31/2008   Vitamin D deficiency 03/01/2008   BPH (benign prostatic hyperplasia) 08/06/2007   Fasting hyperglycemia 12/21/2006   History of right MCA infarct 2006   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   HTN (hypertension) 07/19/2015   Hyperlipidemia    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   Mild, related to stroke history   Nocturia    Renal insufficiency 06/25/2013   S/P carotid endarterectomy    BILATERAL ICA--  PATENT PER DUPLEX  05-19-2012   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/26/2017   Right calf   Urinary frequency    Vitamin D deficiency    Past Surgical History:  Procedure Laterality Date  APPENDECTOMY  AS CHILD   CARDIOVASCULAR STRESS TEST  03-27-2012  DR CRENSHAW   LOW RISK LEXISCAN STUDY-- PROBABLE NORMAL PERFUSION AND SOFT TISSUE ATTENUATION/  NO ISCHEMIA/ EF 51%   CAROTID ENDARTERECTOMY Bilateral LEFT  11-12-2008  DR GREG HAYES   RIGHT ICA  2006  ([facility])   CHOLECYSTECTOMY N/A 02/23/2022   Procedure: LAPAROSCOPIC CHOLECYSTECTOMY;  Surgeon: Felicie Morn, MD;  Location: [facility];  Service: General;  Laterality: N/A;   CYSTOSCOPY W/ RETROGRADES Bilateral 06/22/2021   Procedure: CYSTOSCOPY WITH  RETROGRADE PYELOGRAM;  Surgeon: Franchot Gallo, MD;  Location: [facility];  Service: Urology;  Laterality: Bilateral;   CYSTOSCOPY WITH LITHOLAPAXY N/A 02/26/2013   Procedure: CYSTOSCOPY WITH LITHOLAPAXY;  Surgeon: Franchot Gallo, MD;  Location: [facility];  Service: Urology;  Laterality: N/A;   ENDOSCOPIC RETROGRADE CHOLANGIOPANCREATOGRAPHY (ERCP) WITH PROPOFOL N/A 02/22/2022   Procedure: ENDOSCOPIC RETROGRADE CHOLANGIOPANCREATOGRAPHY (ERCP) WITH PROPOFOL;  Surgeon: Carol Ada, MD;  Location: [facility];  Service: Gastroenterology;  Laterality: N/A;   EYE SURGERY  Jan. 2016   cataract surgery both eyes   INGUINAL HERNIA REPAIR Right 11-08-2006   IR KYPHO EA ADDL LEVEL THORACIC OR LUMBAR  02/12/2021   IR RADIOLOGIST EVAL & MGMT  02/18/2021   MASS EXCISION N/A 03/03/2016   Procedure: EXCISION OF BACK  MASS;  Surgeon: Stark Klein, MD;  Location: [facility];  Service: General;  Laterality: N/A;   MOHS SURGERY Left 1/ 2016   Dr Nevada Crane-- Basal cell   PROSTATE SURGERY     REMOVAL OF STONES  02/22/2022   Procedure: REMOVAL OF STONES;  Surgeon: Carol Ada, MD;  Location[facility];  Service: Gastroenterology;;   [name]  02/22/2022   Procedure: [name];  Surgeon: Carol Ada, MD;  Location: [facility] END[facility]COPY;  Service: Gastroenterology;;   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-C N/A 06/22/2021   Procedure: TRANSURETHRAL RESECTION OF BLADDER TUMOR;  Surgeon: Franchot Gallo, MD;  Location: [facility];  Service: Urology;  Laterality: N/A;   TRANSURETHRAL RESECTION OF PROSTATE N/A 02/26/2013   Procedure: TRANSURETHRAL RESECTION OF THE PROSTATE WITH GYRUS INSTRUMENTS;  Surgeon: Franchot Gallo, MD;  Location: [facility];  Service: Urology;  Laterality: N/A;   TRANSURETHRAL RESECTION OF PROSTATE N/A 06/22/2021   Procedure: TRANSURETHRAL RESECTION OF THE PROSTATE (TURP);  Surgeon: Franchot Gallo, MD;  Location: [facility];  Service: Urology;  Laterality: N/A;   Social History   Tobacco Use   Smoking status: Former    Packs/day: 2.00    Years: 40.00    Total pack years: 80.00    Types: Cigarettes    Quit date: 02/15/2005    Years since quitting: 17.3   Smokeless tobacco: Never  Vaping Use   Vaping Use: Never used  Substance Use Topics   Alcohol use: Not Currently    Comment: Occasional   Drug use: No   Social History   Socioeconomic History   Marital status: Married    Spouse name: Not on file   Number of children: 2   Years of education: 12   Highest education level: High school graduate  Occupational History    Employer: Retired  Tobacco Use   Smoking status: Former    Packs/day: 2.00    Years: 40.00    Total pack years: 80.00    Types: Cigarettes    Quit date: 02/15/2005    Years since quitting: 17.3   Smokeless tobacco: Never  Vaping Use  Vaping Use: Never used  Substance and Sexual Activity   Alcohol use: Not Currently    Comment: Occasional   Drug use: No   Sexual activity: Not Currently    Partners: Female  Other Topics Concern   Not on file  Social History Narrative   Exercise--  Walks dogs everyday      LIves with wife , no stairs in home, caffeine - one cup coffee day, exercise - not much, Right handed, 12th grade, retired      One story home   Social Determinants of [place] Strain: Not on file  Food Insecurity: No Food Insecurity (03/02/2022)   Hunger Vital Sign    Worried About Running Out of Food in the Last Year: Never true    Ran Out of Food in the Last Year: Never true  Transportation Needs: No Transportation Needs (03/02/2022)   PRAPARE - Hydrologist (Medical): No    Lack of Transportation (Non-Medical): No  Physical Activity: Not on file  Stress: Not on file  Social Connections: Not on file  Intimate Partner Violence: Not At Risk (03/02/2022)    Humiliation, Afraid, Rape, and Kick questionnaire    Fear of Current or Ex-Partner: No    Emotionally Abused: No    Physically Abused: No    Sexually Abused: No   Family Status  Relation Name Status   Mother  Deceased at age 43   Father  Deceased at age 23   Sister  62   Sister  64   Sister  Deceased   Family History  Problem Relation Age of Onset   Heart disease Mother        CHF   Bipolar disorder Mother    Heart disease Father        CHF   Allergies  Allergen Reactions   Bee Venom Anaphylaxis   Strawberry Extract Hives   Latex Itching   Zetia [Ezetimibe] Other (See Comments)    Intolerance    Adhesive [Tape] Other (See Comments)    blisters   Statins Other (See Comments)    myalgias      Review of Systems  Constitutional:  Negative for fever and malaise/fatigue.  HENT:  Negative for congestion.   Eyes:  Negative for blurred vision.  Respiratory:  Negative for cough and shortness of breath.   Cardiovascular:  Negative for chest pain, palpitations and leg swelling.  Gastrointestinal:  Negative for vomiting.  Musculoskeletal:  Negative for back pain.  Skin:  Negative for rash.  Neurological:  Negative for loss of consciousness and headaches.      Objective:     BP (!) 142/78 (BP Location: Right Arm, Patient Position: Sitting, Cuff Size: Normal)   Pulse 69   Temp 98.5 F (36.9 C) (Oral)   Resp 18   Ht _0  (1.803 m)   SpO2 99%   BMI 25.10 kg/m  BP Readings from Last 3 Encounters:  05/28/22 (!) 142/78  03/12/22 (!) 147/80  02/25/22 (!) 150/71   Wt Readings from Last 3 Encounters:  02/26/22 180 lb (81.6 kg)  02/19/22 196 lb (88.9 kg)  01/04/22 193 lb 3.2 oz (87.6 kg)   SpO2 Readings from Last 3 Encounters:  05/28/22 99%  03/12/22 96%  02/25/22 96%    Physical Exam Vitals and nursing note reviewed.  Constitutional:      Appearance: He is well-developed.  HENT:     Head: Normocephalic and atraumatic.  Eyes:     Pupils: Pupils are  equal, round, and reactive to light.  Neck:     Thyroid: No thyromegaly.  Cardiovascular:     Rate and Rhythm: Normal rate and regular rhythm.     Heart sounds: No murmur heard. Pulmonary:     Effort: Pulmonary effort is normal. No respiratory distress.     Breath sounds: Normal breath sounds. No wheezing or rales.  Chest:     Chest wall: No tenderness.  Musculoskeletal:     Cervical back: Normal range of motion and neck supple.     Right hip: Tenderness present. Normal range of motion. Normal strength.     Left hip: Tenderness present. Normal range of motion. Normal strength.     Right foot: Bony tenderness present. No swelling.     Left foot: Bony tenderness present. No swelling.  Skin:    General: Skin is warm and dry.  Neurological:     General: No focal deficit present.     Mental Status: He is alert and oriented to person, place, and time.     Motor: Weakness present.     Gait: Gait abnormal.  Psychiatric:        Behavior: Behavior normal.        Thought Content: Thought content normal.        Judgment: Judgment normal.     Results for orders placed or performed in visit on 05/28/22  Carbamazepine Level (Tegretol), total  Result Value Ref Range   Carbamazepine Lvl 6.1 4.0 - 12.0 mg/L    Last CBC Lab Results  Component Value Date   WBC 6.8 05/18/2022   HGB 12.8 (A) 05/18/2022   HCT 39 (A) 05/18/2022   MCV 98.7 03/12/2022   MCH 32.6 03/12/2022   RDW 13.4 03/12/2022   PLT 240 51/76/1607   Last metabolic panel Lab Results  Component Value Date   GLUCOSE 102 (H) 03/12/2022   NA 145 05/18/2022   K 3.9 05/18/2022   CL 103 05/18/2022   CO2 22 05/18/2022   BUN 21 05/18/2022   CREATININE 1.4 (A) 05/18/2022   EGFR 49 05/18/2022   CALCIUM 9.5 05/18/2022   PHOS 3.4 03/03/2022   PROT 5.3 (L) 03/12/2022   ALBUMIN 4.0 05/18/2022   LABGLOB 3.2 04/25/2019   AGRATIO 1.3 04/25/2019   BILITOT 0.4 03/12/2022   ALKPHOS 123 03/12/2022   AST 17 03/12/2022   ALT 25  03/12/2022   ANIONGAP 4 (L) 03/12/2022   Last lipids Lab Results  Component Value Date   CHOL 202 (H) 12/10/2021   HDL 63.00 12/10/2021   LDLCALC 120 (H) 12/10/2021   TRIG 95.0 12/10/2021   CHOLHDL 3 12/10/2021   Last hemoglobin A1c Lab Results  Component Value Date   HGBA1C 5.3 07/27/2021   Last thyroid functions Lab Results  Component Value Date   TSH 0.836 02/27/2022   Last vitamin D Lab Results  Component Value Date   VD25OH 46.2 05/18/2022   Last vitamin B12 and Folate Lab Results  Component Value Date   VITAMINB12 324 03/01/2022   FOLATE 7.0 03/01/2022      The ASCVD Risk score (Arnett DK, et al., 2019) failed to calculate for the following reasons:   The patient has a prior MI or stroke diagnosis    Assessment & Plan:   Problem List Items Addressed This Visit       Unprioritized   Hyperlipidemia    Encourage heart healthy diet such as MIND  or DASH diet, increase exercise, avoid trans fats, simple carbohydrates and processed foods, consider a krill or fish or flaxseed oil cap daily.        HTN (hypertension)    .sbhtn      Hemiplegia, dominant side S/P CVA (cerebrovascular accident) ([place])    Con't pt       CVA (cerebral vascular accident) ([place]) - Primary    Per neuro      Basal ganglia infarction ([place])    Per neuro      Acute 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 ([place])      Chemistry      Component Value Date/Time   NA 145 05/18/2022 0000   K 3.9 05/18/2022 0000   CL 103 05/18/2022 0000   CO2 22 05/18/2022 0000   BUN 21 05/18/2022 0000   CREATININE 1.4 (A) 05/18/2022 0000   CREATININE 1.40 (H) 03/12/2022 0410   CREATININE 1.71 (H) 12/25/2021 1433   GLU 111 05/18/2022 0000      Component Value Date/Time   CALCIUM 9.5 05/18/2022 0000   ALKPHOS 123 03/12/2022 0410   AST 17 03/12/2022 0410   ALT 25 03/12/2022 0410   BILITOT 0.4 03/12/2022 0410   BILITOT 0.3 04/25/2019 1000    Per nephrology      Other  Visit Diagnoses     High risk medication use       Relevant Orders   Carbamazepine Level (Tegretol), total (Completed)   Altered mental status, unspecified altered mental status type       Relevant Orders   POCT Urinalysis Dipstick (Automated)       No follow-ups on file.    Ann Held, DO

## 2022-05-29 NOTE — Assessment & Plan Note (Signed)
Encourage heart healthy diet such as MIND or DASH diet, increase exercise, avoid trans fats, simple carbohydrates and processed foods, consider a krill or fish or flaxseed oil cap daily.  °

## 2022-05-29 NOTE — Assessment & Plan Note (Signed)
Per neuro 

## 2022-05-29 NOTE — Assessment & Plan Note (Signed)
Chemistry      Component Value Date/Time   NA 145 05/18/2022 0000   K 3.9 05/18/2022 0000   CL 103 05/18/2022 0000   CO2 22 05/18/2022 0000   BUN 21 05/18/2022 0000   CREATININE 1.4 (A) 05/18/2022 0000   CREATININE 1.40 (H) 03/12/2022 0410   CREATININE 1.71 (H) 12/25/2021 1433   GLU 111 05/18/2022 0000      Component Value Date/Time   CALCIUM 9.5 05/18/2022 0000   ALKPHOS 123 03/12/2022 0410   AST 17 03/12/2022 0410   ALT 25 03/12/2022 0410   BILITOT 0.4 03/12/2022 0410   BILITOT 0.3 04/25/2019 1000     Per nephrology

## 2022-05-29 NOTE — Assessment & Plan Note (Signed)
.  sbhtn

## 2022-05-29 NOTE — Assessment & Plan Note (Signed)
Con't pt

## 2022-05-30 DIAGNOSIS — S32019D Unspecified fracture of first lumbar vertebra, subsequent encounter for fracture with routine healing: Secondary | ICD-10-CM | POA: Diagnosis not present

## 2022-05-30 DIAGNOSIS — M5430 Sciatica, unspecified side: Secondary | ICD-10-CM | POA: Diagnosis not present

## 2022-05-30 DIAGNOSIS — G8929 Other chronic pain: Secondary | ICD-10-CM | POA: Diagnosis not present

## 2022-05-30 DIAGNOSIS — R0902 Hypoxemia: Secondary | ICD-10-CM | POA: Diagnosis not present

## 2022-05-30 DIAGNOSIS — I69359 Hemiplegia and hemiparesis following cerebral infarction affecting unspecified side: Secondary | ICD-10-CM | POA: Diagnosis not present

## 2022-06-01 ENCOUNTER — Ambulatory Visit: Payer: PPO | Admitting: Podiatry

## 2022-06-01 ENCOUNTER — Telehealth: Payer: Self-pay

## 2022-06-01 DIAGNOSIS — Z7901 Long term (current) use of anticoagulants: Secondary | ICD-10-CM

## 2022-06-01 DIAGNOSIS — M79676 Pain in unspecified toe(s): Secondary | ICD-10-CM

## 2022-06-01 DIAGNOSIS — B351 Tinea unguium: Secondary | ICD-10-CM

## 2022-06-01 NOTE — Progress Notes (Signed)
[facility])   [place] Team Medication Reconciliation  06/01/2022  William [name] [birth year] [id number]  Referral source:  Bevelyn Buckles   OBJECTIVE:  Lab Results  Component Value Date   CREATININE 1.4 (A) 05/18/2022   CREATININE 1.40 (H) 03/12/2022   CREATININE 1.39 (H) 03/11/2022    Lab Results  Component Value Date   HGBA1C 5.3 07/27/2021       Component Value Date/Time   CHOL 202 (H) 12/10/2021 0946   TRIG 95.0 12/10/2021 0946   HDL 63.00 12/10/2021 0946   CHOLHDL 3 12/10/2021 0946   VLDL 19.0 12/10/2021 0946   LDLCALC 120 (H) 12/10/2021 0946    BP Readings from Last 3 Encounters:  05/28/22 (!) 142/78  03/12/22 (!) 147/80  02/25/22 (!) 150/71    Allergies  Allergen Reactions   Bee Venom Anaphylaxis   Strawberry Extract Hives   Latex Itching   Zetia [Ezetimibe] Other (See Comments)    Intolerance    Adhesive [Tape] Other (See Comments)    blisters   Statins Other (See Comments)    myalgias    ASSESSMENT:  Patient was recently discharged from [facility] and all medications have been reviewed.  Date Discharged from [place]: [place]  Date Medication Reconciliation Performed: 06/01/2022   Current Outpatient Medications:    acetaminophen (TYLENOL) 325 MG tablet, Take 2 tablets (650 mg total) by mouth every 6 (six) hours as needed for mild pain., Disp: , Rfl:    amantadine (SYMMETREL) 100 MG capsule, Take 1 capsule (100 mg total) by mouth daily. (Patient taking differently: Take 100 mg by mouth daily. *Crushed*), Disp: 90 capsule, Rfl: 1   amLODipine (NORVASC) 10 MG tablet, Take 1 tablet (10 mg total) by mouth every morning. (Patient taking differently: Take 10 mg by mouth every morning. *Crushed*), Disp: 90 tablet, Rfl: 0   carBAMazepine (TEGRETOL) 100 MG/5ML suspension, Take 5 mLs (100 mg total) by mouth in the morning and in the afternoon, and then 10 mLs (200 mg total) at bedtime.,  Disp: 600 mL, Rfl: 3   Cholecalciferol (VITAMIN D3) 25 MCG (1000 UT) CHEW, Chew 1 tablet by mouth daily., Disp: , Rfl:    clopidogrel (PLAVIX) 75 MG tablet, Take 1 tablet (75 mg total) by mouth daily. (Patient taking differently: Take 75 mg by mouth daily. *Crushed*), Disp: 90 tablet, Rfl: 1   ferrous sulfate 325 (65 FE) MG EC tablet, Take 325 mg by mouth daily with breakfast. *Crushed*, Disp: , Rfl:    hydrALAZINE (APRESOLINE) 25 MG tablet, Take 1 tablet (25 mg total) by mouth every 8 (eight) hours. (Patient taking differently: Take 25 mg by mouth every 8 (eight) hours. *Crushed*), Disp: 270 tablet, Rfl: 1   LORazepam (ATIVAN) 0.5 MG tablet, Take 1/2 tablet (0.25 mg total) by mouth in the morning and take 1 tablet (0.5 mg total) in the afternoon at 4pm., Disp: 50 tablet, Rfl: 4   LORazepam ER (LOREEV XR) 1 MG CS24, Take 1 mg (1 capsule) by mouth at bedtime. (Patient taking differently: Take 1 mg by mouth at bedtime. *Crushed*), Disp: 30 capsule, Rfl: 3   Multiple Vitamins-Minerals (ADULT ONE DAILY GUMMIES) CHEW, Chew 1 tablet by mouth every morning., Disp: , Rfl:    pantoprazole (PROTONIX) 40 MG tablet, Take 1 tablet (40 mg total) by mouth daily. (Patient taking differently: Take 40 mg by mouth daily. *Crushed*), Disp: 90 tablet, Rfl: 3   polyethylene glycol (MIRALAX / GLYCOLAX) 17 g packet,  Take 17 g by mouth daily., Disp: 14 each, Rfl: 0   tamsulosin (FLOMAX) 0.4 MG CAPS capsule, Take 1 capsule (0.4 mg total) by mouth daily after supper. (Patient taking differently: Take 0.4 mg by mouth daily after supper. *Crushed*), Disp: 90 capsule, Rfl: 1   traZODone (DESYREL) 150 MG tablet, Take 0.5 tablets (75 mg total) by mouth at bedtime. *Crushed*, Disp: 20 tablet, Rfl: 0   triamcinolone cream (KENALOG) 0.1 %, Apply 1 application topically 2 (two) times daily. (Patient taking differently: Apply 1 Application topically 2 (two) times daily as needed (rash).), Disp: 30 g, Rfl: 0   clotrimazole-betamethasone  (LOTRISONE) cream, Apply 1 Application on to the skin daily. (Patient not taking: Reported on 06/01/2022), Disp: 30 g, Rfl: 0   fluticasone (CUTIVATE) 9.73 % cream, 1 application daily as needed (psoriasis). (Patient not taking: Reported on 06/01/2022), Disp: , Rfl: 3   hydrOXYzine (VISTARIL) 25 MG capsule, Take 1 capsule by mouth everyday at 3:30 PM as needed (Patient not taking: Reported on 02/26/2022), Disp: 60 capsule, Rfl: 2   LORazepam (ATIVAN) 0.5 MG tablet, Take 0.5 tablets (0.25 mg total) by mouth daily. *Crushed* (Patient not taking: Reported on 06/01/2022), Disp: 10 tablet, Rfl: 0   LORazepam ER (LOREEV XR) 1 MG CS24, Take 1 mg by mouth at bedtime. *Crushed* (Patient not taking: Reported on 06/01/2022), Disp: 20 capsule, Rfl: 0   LORazepam ER (LOREEV XR) 1 MG CS24, Take 1 capsule (1 mg total) by mouth at bedtime as directed. (Patient not taking: Reported on 06/01/2022), Disp: 30 capsule, Rfl: 4 No current facility-administered medications for this visit.  Facility-Administered Medications Ordered in Other Visits:    gemcitabine (GEMZAR) chemo syringe for bladder instillation 2,000 mg, 2,000 mg, Bladder Instillation, Once, Franchot Gallo, MD   Medications Discontinued at Discharge:  Colace 100 mg daily - patient was taking at the facility but is no longer taking at home   Fluticasone 0.05% cream PRN - patient was taking at the facility but is no longer taking at home   New Medications at Discharge:  N/A  Medications with Dose Adjustments at Discharge: Miralax - patient was prescribed in the facility twice daily but is now taking this medication once daily, per wife    Plan:   [facility] will close out MRP medication. Requested Bevelyn Buckles to upload discharge summary to EMR to keep the record.   Thank you for allowing pharmacy to be a part of this patient's care. Kristeen Miss, PharmD Clinical Pharmacist [place] Cell: [phone]

## 2022-06-03 ENCOUNTER — Encounter: Payer: Self-pay | Admitting: Family Medicine

## 2022-06-03 ENCOUNTER — Other Ambulatory Visit (HOSPITAL_BASED_OUTPATIENT_CLINIC_OR_DEPARTMENT_OTHER): Payer: Self-pay

## 2022-06-03 MED ORDER — COMIRNATY 30 MCG/0.3ML IM SUSY
PREFILLED_SYRINGE | INTRAMUSCULAR | 0 refills | Status: DC
  Filled 2022-06-03: qty 0.3, 1d supply, fill #0

## 2022-06-03 MED ORDER — BOOSTRIX 5-2.5-18.5 LF-MCG/0.5 IM SUSY
PREFILLED_SYRINGE | INTRAMUSCULAR | 0 refills | Status: DC
  Filled 2022-06-03: qty 0.5, 1d supply, fill #0

## 2022-06-04 NOTE — Progress Notes (Signed)
Subjective: 81 y.o. returns the office today for painful, elongated, thickened toenails which he cannot trim himself.  His toenails are sensitive.  Denies any drainage or pus from the sternal sites.  No open lesions.  No other concerns today.  He is on Plavix.  PCP: Ann Held, DO Last seen May 28, 2022  Objective: NAD, presents with his wife DP/PT pulses palpable, CRT less than 3 seconds  Nails hypertrophic, dystrophic, elongated, brittle, discolored x 10.  Incurvation of the left hallux toenail without any edema, erythema or signs of infection.  There is tenderness overlying the nails 1-5 bilaterally. There is no edema, erythema or drainage to the toenail sites today. Hammertoes present No open lesions No pain with calf compression, swelling, warmth, erythema.  Assessment: Patient presents with symptomatic onychomycosis  Plan: -Treatment options including alternatives, risks, complications were discussed -Nails debrided x 10 without any complications or bleeding.  Ingrown toenails been doing well at this time.  Monitor for any signs or symptoms of infection or pain associated with this.  -Continue daily dressing changes for the abrasion.  Monitoring signs or symptoms of infection.  Return in about 3 months (around 08/31/2022) for routine care.  Celesta Gentile, DPM

## 2022-06-06 ENCOUNTER — Other Ambulatory Visit: Payer: Self-pay | Admitting: Family Medicine

## 2022-06-08 DIAGNOSIS — I10 Essential (primary) hypertension: Secondary | ICD-10-CM | POA: Diagnosis not present

## 2022-06-08 DIAGNOSIS — N1832 Chronic kidney disease, stage 3b: Secondary | ICD-10-CM | POA: Diagnosis not present

## 2022-06-15 ENCOUNTER — Encounter: Payer: Self-pay | Admitting: Family Medicine

## 2022-06-15 ENCOUNTER — Ambulatory Visit (INDEPENDENT_AMBULATORY_CARE_PROVIDER_SITE_OTHER): Payer: PPO | Admitting: Family Medicine

## 2022-06-15 ENCOUNTER — Other Ambulatory Visit (HOSPITAL_BASED_OUTPATIENT_CLINIC_OR_DEPARTMENT_OTHER): Payer: Self-pay

## 2022-06-15 VITALS — BP 118/70 | HR 69 | Temp 98.7°F | Resp 18 | Ht 71.0 in

## 2022-06-15 DIAGNOSIS — I6381 Other cerebral infarction due to occlusion or stenosis of small artery: Secondary | ICD-10-CM | POA: Diagnosis not present

## 2022-06-15 DIAGNOSIS — N1832 Chronic kidney disease, stage 3b: Secondary | ICD-10-CM

## 2022-06-15 DIAGNOSIS — I6523 Occlusion and stenosis of bilateral carotid arteries: Secondary | ICD-10-CM

## 2022-06-15 DIAGNOSIS — W19XXXD Unspecified fall, subsequent encounter: Secondary | ICD-10-CM

## 2022-06-15 DIAGNOSIS — F419 Anxiety disorder, unspecified: Secondary | ICD-10-CM | POA: Diagnosis not present

## 2022-06-15 DIAGNOSIS — R4182 Altered mental status, unspecified: Secondary | ICD-10-CM | POA: Diagnosis not present

## 2022-06-15 DIAGNOSIS — I639 Cerebral infarction, unspecified: Secondary | ICD-10-CM

## 2022-06-15 DIAGNOSIS — E785 Hyperlipidemia, unspecified: Secondary | ICD-10-CM | POA: Diagnosis not present

## 2022-06-15 DIAGNOSIS — R21 Rash and other nonspecific skin eruption: Secondary | ICD-10-CM

## 2022-06-15 DIAGNOSIS — I69359 Hemiplegia and hemiparesis following cerebral infarction affecting unspecified side: Secondary | ICD-10-CM | POA: Diagnosis not present

## 2022-06-15 DIAGNOSIS — K567 Ileus, unspecified: Secondary | ICD-10-CM | POA: Diagnosis not present

## 2022-06-15 DIAGNOSIS — R531 Weakness: Secondary | ICD-10-CM | POA: Diagnosis not present

## 2022-06-15 DIAGNOSIS — I10 Essential (primary) hypertension: Secondary | ICD-10-CM

## 2022-06-15 DIAGNOSIS — I69354 Hemiplegia and hemiparesis following cerebral infarction affecting left non-dominant side: Secondary | ICD-10-CM | POA: Diagnosis not present

## 2022-06-15 DIAGNOSIS — N401 Enlarged prostate with lower urinary tract symptoms: Secondary | ICD-10-CM | POA: Diagnosis not present

## 2022-06-15 DIAGNOSIS — R1310 Dysphagia, unspecified: Secondary | ICD-10-CM | POA: Diagnosis not present

## 2022-06-15 DIAGNOSIS — F01A0 Vascular dementia, mild, without behavioral disturbance, psychotic disturbance, mood disturbance, and anxiety: Secondary | ICD-10-CM | POA: Diagnosis not present

## 2022-06-15 DIAGNOSIS — Z0000 Encounter for general adult medical examination without abnormal findings: Secondary | ICD-10-CM | POA: Diagnosis not present

## 2022-06-15 DIAGNOSIS — I129 Hypertensive chronic kidney disease with stage 1 through stage 4 chronic kidney disease, or unspecified chronic kidney disease: Secondary | ICD-10-CM | POA: Diagnosis not present

## 2022-06-15 DIAGNOSIS — N171 Acute kidney failure with acute cortical necrosis: Secondary | ICD-10-CM | POA: Diagnosis not present

## 2022-06-15 DIAGNOSIS — R350 Frequency of micturition: Secondary | ICD-10-CM | POA: Diagnosis not present

## 2022-06-15 DIAGNOSIS — D649 Anemia, unspecified: Secondary | ICD-10-CM | POA: Diagnosis not present

## 2022-06-15 DIAGNOSIS — K219 Gastro-esophageal reflux disease without esophagitis: Secondary | ICD-10-CM | POA: Diagnosis not present

## 2022-06-15 DIAGNOSIS — I69818 Other symptoms and signs involving cognitive functions following other cerebrovascular disease: Secondary | ICD-10-CM | POA: Diagnosis not present

## 2022-06-15 LAB — LIPID PANEL
Cholesterol: 216 mg/dL — ABNORMAL HIGH (ref 0–200)
HDL: 50.7 mg/dL (ref >39.00)
LDL Cholesterol: 137 mg/dL — ABNORMAL HIGH (ref 0–99)
NonHDL: 164.98
Total CHOL/HDL Ratio: 4
Triglycerides: 138 mg/dL (ref 0.0–149.0)
VLDL: 27.6 mg/dL (ref 0.0–40.0)

## 2022-06-15 LAB — COMPREHENSIVE METABOLIC PANEL
ALT: 9 U/L (ref 0–53)
AST: 11 U/L (ref 0–37)
Albumin: 3.8 g/dL (ref 3.5–5.2)
Alkaline Phosphatase: 144 U/L — ABNORMAL HIGH (ref 39–117)
BUN: 25 mg/dL — ABNORMAL HIGH (ref 6–23)
CO2: 27 mEq/L (ref 19–32)
Calcium: 9.3 mg/dL (ref 8.4–10.5)
Chloride: 106 mEq/L (ref 96–112)
Creatinine, Ser: 1.55 mg/dL — ABNORMAL HIGH (ref 0.40–1.50)
GFR: 42.19 mL/min — ABNORMAL LOW (ref >60.00)
Glucose, Bld: 97 mg/dL (ref 70–99)
Potassium: 4 mEq/L (ref 3.5–5.1)
Sodium: 143 mEq/L (ref 135–145)
Total Bilirubin: 0.3 mg/dL (ref 0.2–1.2)
Total Protein: 6.7 g/dL (ref 6.0–8.3)

## 2022-06-15 LAB — POC URINALSYSI DIPSTICK (AUTOMATED)
Blood, UA: NEGATIVE
Glucose, UA: NEGATIVE
Ketones, UA: NEGATIVE
Leukocytes, UA: NEGATIVE
Nitrite, UA: NEGATIVE
Protein, UA: POSITIVE — AB
Spec Grav, UA: 1.025 (ref 1.010–1.025)
Urobilinogen, UA: 0.2 E.U./dL
pH, UA: 6 (ref 5.0–8.0)

## 2022-06-15 LAB — CBC WITH DIFFERENTIAL/PLATELET
Basophils Absolute: 0 10*3/uL (ref 0.0–0.1)
Basophils Relative: 0.6 % (ref 0.0–3.0)
Eosinophils Absolute: 0.1 10*3/uL (ref 0.0–0.7)
Eosinophils Relative: 1.8 % (ref 0.0–5.0)
HCT: 36.8 % — ABNORMAL LOW (ref 39.0–52.0)
Hemoglobin: 12.5 g/dL — ABNORMAL LOW (ref 13.0–17.0)
Lymphocytes Relative: 21.2 % (ref 12.0–46.0)
Lymphs Abs: 1.5 10*3/uL (ref 0.7–4.0)
MCHC: 33.9 g/dL (ref 30.0–36.0)
MCV: 95.5 fl (ref 78.0–100.0)
Monocytes Absolute: 0.7 10*3/uL (ref 0.1–1.0)
Monocytes Relative: 9.3 % (ref 3.0–12.0)
Neutro Abs: 4.8 10*3/uL (ref 1.4–7.7)
Neutrophils Relative %: 67.1 % (ref 43.0–77.0)
Platelets: 244 10*3/uL (ref 150.0–400.0)
RBC: 3.86 Mil/uL — ABNORMAL LOW (ref 4.22–5.81)
RDW: 15.5 % (ref 11.5–15.5)
WBC: 7.2 10*3/uL (ref 4.0–10.5)

## 2022-06-15 LAB — PSA: PSA: 1.75 ng/mL (ref 0.10–4.00)

## 2022-06-15 MED ORDER — TRAZODONE HCL 150 MG PO TABS
75.0000 mg | ORAL_TABLET | Freq: Every day | ORAL | 0 refills | Status: DC
  Filled 2022-06-15: qty 20, 40d supply, fill #0

## 2022-06-15 MED ORDER — PREDNISONE 10 MG PO TABS
ORAL_TABLET | ORAL | 0 refills | Status: AC
  Filled 2022-06-15: qty 20, 12d supply, fill #0

## 2022-06-15 NOTE — Assessment & Plan Note (Signed)
Stable  Per urology

## 2022-06-15 NOTE — Assessment & Plan Note (Signed)
Check urine today 

## 2022-06-15 NOTE — Assessment & Plan Note (Signed)
Stable , no new symptoms

## 2022-06-15 NOTE — Assessment & Plan Note (Signed)
Check labs  Last [place] Feb 2023

## 2022-06-15 NOTE — Patient Instructions (Signed)
Preventive Care 65 Years and Older, Male Preventive care refers to lifestyle choices and visits with your health care provider that can promote health and wellness. Preventive care visits are also called wellness exams. What can I expect for my preventive care visit? Counseling During your preventive care visit, your health care provider may ask about your: Medical history, including: Past medical problems. Family medical history. History of falls. Current health, including: Emotional well-being. Home life and relationship well-being. Sexual activity. Memory and ability to understand (cognition). Lifestyle, including: Alcohol, nicotine or tobacco, and drug use. Access to firearms. Diet, exercise, and sleep habits. Work and work environment. Sunscreen use. Safety issues such as seatbelt and bike helmet use. Physical exam Your health care provider will check your: Height and weight. These may be used to calculate your BMI (body mass index). BMI is a measurement that tells if you are at a healthy weight. Waist circumference. This measures the distance around your waistline. This measurement also tells if you are at a healthy weight and may help predict your risk of certain diseases, such as type 2 diabetes and high blood pressure. Heart rate and blood pressure. Body temperature. Skin for abnormal spots. What immunizations do I need?  Vaccines are usually given at various ages, according to a schedule. Your health care provider will recommend vaccines for you based on your age, medical history, and lifestyle or other factors, such as travel or where you work. What tests do I need? Screening Your health care provider may recommend screening tests for certain conditions. This may include: Lipid and cholesterol levels. Diabetes screening. This is done by checking your blood sugar (glucose) after you have not eaten for a while (fasting). Hepatitis C test. Hepatitis B test. HIV (human  immunodeficiency virus) test. STI (sexually transmitted infection) testing, if you are at risk. Lung cancer screening. Colorectal cancer screening. Prostate cancer screening. Abdominal aortic aneurysm (AAA) screening. You may need this if you are a current or former smoker. Talk with your health care provider about your test results, treatment options, and if necessary, the need for more tests. Follow these instructions at home: Eating and drinking  Eat a diet that includes fresh fruits and vegetables, whole grains, lean protein, and low-fat dairy products. Limit your intake of foods with high amounts of sugar, saturated fats, and salt. Take vitamin and mineral supplements as recommended by your health care provider. Do not drink alcohol if your health care provider tells you not to drink. If you drink alcohol: Limit how much you have to 0-2 drinks a day. Know how much alcohol is in your drink. In the [place] one drink equals one 12 oz bottle of beer (355 mL), one 5 oz glass of wine (148 mL), or one 1 oz glass of hard liquor (44 mL). Lifestyle Brush your teeth every morning and night with fluoride toothpaste. Floss one time each day. Exercise for at least 30 minutes 5 or more days each week. Do not use any products that contain nicotine or tobacco. These products include cigarettes, chewing tobacco, and vaping devices, such as e-cigarettes. If you need help quitting, ask your health care provider. Do not use drugs. If you are sexually active, practice safe sex. Use a condom or other form of protection to prevent STIs. Take aspirin only as told by your health care provider. Make sure that you understand how much to take and what form to take. Work with your health care provider to find out whether it is safe   and beneficial for you to take aspirin daily. Ask your health care provider if you need to take a cholesterol-lowering medicine (statin). Find healthy ways to manage stress, such  as: Meditation, yoga, or listening to music. Journaling. Talking to a trusted person. Spending time with friends and family. Safety Always wear your seat belt while driving or riding in a vehicle. Do not drive: If you have been drinking alcohol. Do not ride with someone who has been drinking. When you are tired or distracted. While texting. If you have been using any mind-altering substances or drugs. Wear a helmet and other protective equipment during sports activities. If you have firearms in your house, make sure you follow all gun safety procedures. Minimize exposure to UV radiation to reduce your risk of skin cancer. What's next? Visit your health care provider once a year for an annual wellness visit. Ask your health care provider how often you should have your eyes and teeth checked. Stay up to date on all vaccines. This information is not intended to replace advice given to you by your health care provider. Make sure you discuss any questions you have with your health care provider. Document Revised: 11/26/2020 Document Reviewed: 11/26/2020 Elsevier Patient Education  2023 Elsevier Inc.  

## 2022-06-15 NOTE — Assessment & Plan Note (Signed)
Stable  Pt getting home PT

## 2022-06-15 NOTE — Progress Notes (Signed)
Subjective:   By signing my name below, I, Eugene Gavia, attest that this documentation has been prepared under the direction and in the presence of Ann Held, DO 06/15/22    Patient ID: William [name], male    DOB: [birth year], 82 y.o.   MRN: [id number]  Chief Complaint  Patient presents with   Annual Exam    Pt states fasting     HPI Patient is in today for a comprehensive physical exam. He is accompanied by his wife for today's visit.  He was last seen by me for a physical exam on 02/03/21 after a recent L1 vertebra fracture with lingering back and radiating buttock pain. Patient was last seen by me in follow up on 05/28/22 following discharge from rehab s/p CVA.  Today, he reports complaints of dysuria, urinary frequency, and urinary hesitancy. His wife has concerns that he may have a UTI.  He also complains of pruritic bumps on his mid back. He denies any pain or burning of the area. He denies any blistering. His wife has been applying Lotrisone cream to the area with temporary itching relief but has not seemed to improve the rash overall.  Patient reports frequent, repetitive tapping of his right foot. His wife notes that he often taps his foot and shakes his bed rail at night.  He is walking with his walker at times but is primarily using his wheelchair due to his unsteady gait and leg weakness. He has home healthcare for PT twice a week and OT twice a week. His wife states that they have primarily been working on use of his arm following his stroke. She would like for him to resume outpatient physical therapy once he completes home health.  He has not been to the eye doctor in a little over a year. He is UTD on dentist follow up. Patient's immunizations are UTD.  Patient denies having any ear pain, fever, new muscle pain, new joint pain, new moles, congestion, sinus pain, sore throat, chest pain, palpations, cough, SOB, wheezing, n/v/d, constipation, blood in stool,  hematuria, or headaches at this time.   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   HTN (hypertension) 07/19/2015   Hyperlipidemia    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   Mild, related to stroke history   Nocturia    Renal insufficiency 06/25/2013   S/P carotid endarterectomy    BILATERAL ICA--  PATENT PER DUPLEX  05-19-2012   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   Right calf   Urinary frequency    Vitamin D deficiency     Past Surgical History:  Procedure Laterality Date   APPENDECTOMY  AS CHILD   CARDIOVASCULAR STRESS TEST  03-27-2012  DR CRENSHAW   LOW RISK LEXISCAN STUDY-- PROBABLE NORMAL PERFUSION AND SOFT TISSUE ATTENUATION/  NO ISCHEMIA/ EF 51%   CAROTID ENDARTERECTOMY Bilateral LEFT  11-12-2008  DR GREG HAYES   RIGHT ICA  2006  ([facility])   CHOLECYSTECTOMY N/A 02/23/2022   Procedure:  LAPAROSCOPIC CHOLECYSTECTOMY;  Surgeon: Felicie Morn, MD;  Location: [facility];  Service: General;  Laterality: N/A;   CYSTOSCOPY W/ RETROGRADES Bilateral 06/22/2021   Procedure: CYSTOSCOPY WITH RETROGRADE PYELOGRAM;  Surgeon: Franchot Gallo, MD;  Location: [facility];  Service: Urology;  Laterality: Bilateral;   CYSTOSCOPY WITH LITHOLAPAXY N/A 02/26/2013   Procedure: CYSTOSCOPY WITH LITHOLAPAXY;  Surgeon: Franchot Gallo, MD;  Location: [facility];  Service: Urology;  Laterality: N/A;   ENDOSCOPIC RETROGRADE CHOLANGIOPANCREATOGRAPHY (ERCP) WITH PROPOFOL N/A 02/22/2022   Procedure: ENDOSCOPIC RETROGRADE CHOLANGIOPANCREATOGRAPHY  (ERCP) WITH PROPOFOL;  Surgeon: Carol Ada, MD;  Location: [place];  Service: Gastroenterology;  Laterality: N/A;   EYE SURGERY  Jan. 2016   cataract surgery both eyes   INGUINAL HERNIA REPAIR Right 11-08-2006   IR KYPHO EA ADDL LEVEL THORACIC OR LUMBAR  02/12/2021   IR RADIOLOGIST EVAL & MGMT  02/18/2021   MASS EXCISION N/A 03/03/2016   Procedure: EXCISION OF BACK  MASS;  Surgeon: Stark Klein, MD;  Location: [place];  Service: General;  Laterality: N/A;   MOHS SURGERY Left 1/ 2016   Dr Nevada Crane-- Basal cell   PROSTATE SURGERY     REMOVAL OF STONES  02/22/2022   Procedure: REMOVAL OF STONES;  Surgeon: Carol Ada, MD;  Location[facility] ENDOSCOPY;  Service: Gastroenterology;;   [name]  02/22/2022   Procedure: [name];  Surgeon: Carol Ada, MD;  Location: [facility] ENDOSCOPY;  Service: Gastroenterology;;   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-C N/A 06/22/2021   Procedure: TRANSURETHRAL RESECTION OF BLADDER TUMOR;  Surgeon: Franchot Gallo, MD;  Location: [facility];  Service: Urology;  Laterality: N/A;   TRANSURETHRAL RESECTION OF PROSTATE N/A 02/26/2013   Procedure: TRANSURETHRAL RESECTION OF THE PROSTATE WITH GYRUS INSTRUMENTS;  Surgeon: Franchot Gallo, MD;  Location: [facility];  Service: Urology;  Laterality: N/A;   TRANSURETHRAL RESECTION OF PROSTATE N/A 06/22/2021   Procedure: TRANSURETHRAL RESECTION OF THE PROSTATE (TURP);  Surgeon: Franchot Gallo, MD;  Location: [facility];  Service: Urology;  Laterality: N/A;    Family History  Problem Relation Age of Onset   Heart disease Mother        CHF   Bipolar disorder Mother    Heart disease Father        CHF    Social History   Socioeconomic History   Marital status: Married    Spouse name: Not on file   Number of children: 2   Years of education: 12   Highest education level: High school graduate  Occupational History    Employer:  Retired  Tobacco Use   Smoking status: Former    Packs/day: 2.00    Years: 40.00    Total pack years: 80.00    Types: Cigarettes    Quit date: 02/15/2005    Years since quitting: 17.3   Smokeless tobacco: Never  Vaping Use   Vaping Use: Never used  Substance and Sexual Activity   Alcohol use: Not Currently    Comment: Occasional   Drug use: No   Sexual activity: Not Currently    Partners: Female  Other Topics Concern   Not on file  Social History Narrative   Exercise--  Walks with assistance -- using wheelchair more than the walker       LIves with wife , no stairs in home, caffeine - one cup coffee day, exercise - not much, Right handed, 12th grade, retired  One story home   Social Determinants of Health   Financial Resource Strain: Not on file  Food Insecurity: No Food Insecurity (03/02/2022)   Hunger Vital Sign    Worried About Running Out of Food in the Last Year: Never true    Ran Out of Food in the Last Year: Never true  Transportation Needs: No Transportation Needs (03/02/2022)   PRAPARE - Hydrologist (Medical): No    Lack of Transportation (Non-Medical): No  Physical Activity: Not on file  Stress: Not on file  Social Connections: Not on file  Intimate Partner Violence: Not At Risk (03/02/2022)   Humiliation, Afraid, Rape, and Kick questionnaire    Fear of Current or Ex-Partner: No    Emotionally Abused: No    Physically Abused: No    Sexually Abused: No    Outpatient Medications Prior to Visit  Medication Sig Dispense Refill   acetaminophen (TYLENOL) 325 MG tablet Take 2 tablets (650 mg total) by mouth every 6 (six) hours as needed for mild pain.     amantadine (SYMMETREL) 100 MG capsule Take 1 capsule (100 mg total) by mouth daily. (Patient taking differently: Take 100 mg by mouth daily. *Crushed*) 90 capsule 1   amLODipine (NORVASC) 10 MG tablet Take 1 tablet (10 mg total) by mouth every morning. (Patient taking differently:  Take 10 mg by mouth every morning. *Crushed*) 90 tablet 0   carBAMazepine (TEGRETOL) 100 MG/5ML suspension Take 5 mLs (100 mg total) by mouth in the morning and in the afternoon, and then 10 mLs (200 mg total) at bedtime. 600 mL 3   Cholecalciferol (VITAMIN D3) 25 MCG (1000 UT) CHEW Chew 1 tablet by mouth daily.     clopidogrel (PLAVIX) 75 MG tablet Take 1 tablet (75 mg total) by mouth daily. (Patient taking differently: Take 75 mg by mouth daily. *Crushed*) 90 tablet 1   clotrimazole-betamethasone (LOTRISONE) cream Apply 1 Application on to the skin daily. 30 g 0   ferrous sulfate 325 (65 FE) MG EC tablet Take 325 mg by mouth daily with breakfast. *Crushed*     fluticasone (CUTIVATE) 1.19 % cream 1 application  daily as needed (psoriasis).  3   hydrALAZINE (APRESOLINE) 25 MG tablet Take 1 tablet (25 mg total) by mouth every 8 (eight) hours. (Patient taking differently: Take 25 mg by mouth every 8 (eight) hours. *Crushed*) 270 tablet 1   hydrOXYzine (VISTARIL) 25 MG capsule Take 1 capsule by mouth everyday at 3:30 PM as needed 60 capsule 2   LORazepam (ATIVAN) 0.5 MG tablet Take 0.5 tablets (0.25 mg total) by mouth daily. *Crushed* (Patient taking differently: Take 0.25 mg by mouth daily. *Crushed*) 10 tablet 0   LORazepam (ATIVAN) 0.5 MG tablet Take 1/2 tablet (0.25 mg total) by mouth in the morning and take 1 tablet (0.5 mg total) in the afternoon at 4pm. 50 tablet 4   LORazepam ER (LOREEV XR) 1 MG CS24 Take 1 mg (1 capsule) by mouth at bedtime. (Patient taking differently: Take 1 mg by mouth at bedtime. *Crushed*) 30 capsule 3   LORazepam ER (LOREEV XR) 1 MG CS24 Take 1 mg by mouth at bedtime. *Crushed* (Patient taking differently: Take 1 mg by mouth at bedtime. *Crushed*) 20 capsule 0   LORazepam ER (LOREEV XR) 1 MG CS24 Take 1 capsule (1 mg total) by mouth at bedtime as directed. 30 capsule 4   Multiple Vitamins-Minerals (ADULT ONE DAILY GUMMIES) CHEW Chew 1 tablet by mouth every  morning.      pantoprazole (PROTONIX) 40 MG tablet Take 1 tablet (40 mg total) by mouth daily. (Patient taking differently: Take 40 mg by mouth daily. *Crushed*) 90 tablet 3   polyethylene glycol (MIRALAX / GLYCOLAX) 17 g packet Take 17 g by mouth daily. 14 each 0   tamsulosin (FLOMAX) 0.4 MG CAPS capsule Take 1 capsule (0.4 mg total) by mouth daily after supper. (Patient taking differently: Take 0.4 mg by mouth daily after supper. *Crushed*) 90 capsule 1   triamcinolone cream (KENALOG) 0.1 % Apply 1 application topically 2 (two) times daily. (Patient taking differently: Apply 1 Application topically 2 (two) times daily as needed (rash).) 30 g 0   traZODone (DESYREL) 150 MG tablet Take 0.5 tablets (75 mg total) by mouth at bedtime. *Crushed* 20 tablet 0   COVID-19 mRNA vaccine 2023-2024 (COMIRNATY) syringe Inject into the muscle. (Patient not taking: Reported on 06/15/2022) 0.3 mL 0   Tdap (BOOSTRIX) 5-2.5-18.5 LF-MCG/0.5 injection Inject into the muscle. (Patient not taking: Reported on 06/15/2022) 0.5 mL 0   Facility-Administered Medications Prior to Visit  Medication Dose Route Frequency Provider Last Rate Last Admin   gemcitabine (GEMZAR) chemo syringe for bladder instillation 2,000 mg  2,000 mg Bladder Instillation Once Franchot Gallo, MD        Allergies  Allergen Reactions   Bee Venom Anaphylaxis   Strawberry Extract Hives   Latex Itching   Zetia [Ezetimibe] Other (See Comments)    Intolerance    Adhesive [Tape] Other (See Comments)    blisters   Statins Other (See Comments)    myalgias    Review of Systems  Constitutional:  Negative for chills, fever, malaise/fatigue and weight loss.  HENT:  Negative for ear pain, hearing loss, sinus pain and sore throat.   Respiratory:  Negative for cough and hemoptysis.   Cardiovascular:  Negative for chest pain, palpitations, leg swelling and PND.  Gastrointestinal:  Negative for abdominal pain, constipation, diarrhea, heartburn, nausea and vomiting.   Genitourinary:  Positive for dysuria and frequency. Negative for urgency.  Musculoskeletal:  Negative for back pain, myalgias and neck pain.  Skin:  Positive for itching and rash.  Neurological:  Positive for weakness. Negative for dizziness, tingling, seizures and headaches.  Endo/Heme/Allergies:  Negative for polydipsia.  Psychiatric/Behavioral:  Negative for depression. The patient is not nervous/anxious.        Objective:    Physical Exam Vitals and nursing note reviewed.  Constitutional:      General: He is not in acute distress.    Appearance: He is well-developed. He is not diaphoretic.  HENT:     Head: Normocephalic and atraumatic.     Right Ear: External ear normal.     Left Ear: External ear normal.     Ears:     Comments: Increased cerumen bilaterally    Nose: Nose normal.     Mouth/Throat:     Pharynx: No oropharyngeal exudate.  Eyes:     General:        Right eye: No discharge.        Left eye: No discharge.     Conjunctiva/sclera: Conjunctivae normal.     Pupils: Pupils are equal, round, and reactive to light.  Neck:     Thyroid: No thyromegaly.     Vascular: No JVD.  Cardiovascular:     Rate and Rhythm: Normal rate and regular rhythm.     Heart sounds: No murmur heard. Pulmonary:     Effort: Pulmonary  effort is normal. No respiratory distress.     Breath sounds: Normal breath sounds. No wheezing or rales.  Chest:     Chest wall: No tenderness.  Abdominal:     General: Bowel sounds are normal. There is no distension.     Palpations: Abdomen is soft. There is no mass.     Tenderness: There is no abdominal tenderness. There is no guarding or rebound.  Musculoskeletal:     Cervical back: Normal range of motion and neck supple.  Lymphadenopathy:     Cervical: No cervical adenopathy.  Skin:    General: Skin is warm and dry.     Findings: Rash present. No erythema. Rash is papular (diffuse, lower back).  Neurological:     Mental Status: He is alert.  Mental status is at baseline.     Motor: Weakness present.     Comments: L upper and lower ext weakness---- unchanged   Psychiatric:        Behavior: Behavior normal.        Thought Content: Thought content normal.        Judgment: Judgment normal.     BP 118/70 (BP Location: Left Arm, Patient Position: Sitting, Cuff Size: Normal)   Pulse 69   Temp 98.7 F (37.1 C) (Oral)   Resp 18   Ht _0  (1.803 m)   SpO2 98%   BMI 25.10 kg/m  Wt Readings from Last 3 Encounters:  02/26/22 180 lb (81.6 kg)  02/19/22 196 lb (88.9 kg)  01/04/22 193 lb 3.2 oz (87.6 kg)    Diabetic Foot Exam - Simple   No data filed    Lab Results  Component Value Date   WBC 6.8 05/18/2022   HGB 12.8 (A) 05/18/2022   HCT 39 (A) 05/18/2022   PLT 240 05/18/2022   GLUCOSE 102 (H) 03/12/2022   CHOL 202 (H) 12/10/2021   TRIG 95.0 12/10/2021   HDL 63.00 12/10/2021   LDLCALC 120 (H) 12/10/2021   ALT 25 03/12/2022   AST 17 03/12/2022   NA 145 05/18/2022   K 3.9 05/18/2022   CL 103 05/18/2022   CREATININE 1.4 (A) 05/18/2022   BUN 21 05/18/2022   CO2 22 05/18/2022   TSH 0.836 02/27/2022   PSA 2.10 12/24/2016   INR 1.1 02/20/2022   HGBA1C 5.3 07/27/2021   MICROALBUR 7.4 (H) 07/31/2020    Lab Results  Component Value Date   TSH 0.836 02/27/2022   Lab Results  Component Value Date   WBC 6.8 05/18/2022   HGB 12.8 (A) 05/18/2022   HCT 39 (A) 05/18/2022   MCV 98.7 03/12/2022   PLT 240 05/18/2022   Lab Results  Component Value Date   NA 145 05/18/2022   K 3.9 05/18/2022   CO2 22 05/18/2022   GLUCOSE 102 (H) 03/12/2022   BUN 21 05/18/2022   CREATININE 1.4 (A) 05/18/2022   BILITOT 0.4 03/12/2022   ALKPHOS 123 03/12/2022   AST 17 03/12/2022   ALT 25 03/12/2022   PROT 5.3 (L) 03/12/2022   ALBUMIN 4.0 05/18/2022   CALCIUM 9.5 05/18/2022   ANIONGAP 4 (L) 03/12/2022   EGFR 49 05/18/2022   GFR 37.90 (L) 12/10/2021   Lab Results  Component Value Date   CHOL 202 (H) 12/10/2021   Lab  Results  Component Value Date   HDL 63.00 12/10/2021   Lab Results  Component Value Date   LDLCALC 120 (H) 12/10/2021   Lab Results  Component Value Date  TRIG 95.0 12/10/2021   Lab Results  Component Value Date   CHOLHDL 3 12/10/2021   Lab Results  Component Value Date   HGBA1C 5.3 07/27/2021       Assessment & Plan:   Problem List Items Addressed This Visit       Unprioritized   Urinary frequency    Check urine today       Relevant Orders   POCT Urinalysis Dipstick (Automated) (Completed)   Urine Culture   PSA   Rash    Pred taper  Refer to derm      Relevant Medications   predniSONE (DELTASONE) 10 MG tablet   Other Relevant Orders   Ambulatory referral to Dermatology   Preventative health care - Primary   Hyperlipidemia   Relevant Orders   Lipid panel   Comprehensive metabolic panel   CBC with Differential/Platelet   HTN (hypertension)   Relevant Orders   Lipid panel   Comprehensive metabolic panel   CBC with Differential/Platelet   Hemiplegia, dominant side S/P CVA (cerebrovascular accident) ([place])    Stable  Pt getting home PT       CVA (cerebral vascular accident) ([place])   Relevant Orders   Lipid panel   Comprehensive metabolic panel   CBC with Differential/Platelet   Carotid artery stenosis, asymptomatic, bilateral    Check labs  Last [place] Feb 2023      BPH (benign prostatic hyperplasia)    Stable  Per urology      Basal ganglia infarction ([place])    Stable , no new symptoms       Anxiety    Stable       Relevant Medications   traZODone (DESYREL) 150 MG tablet   Acute 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 (HCC)   Relevant Orders   Lipid panel   Comprehensive metabolic panel   CBC with Differential/Platelet   Other Visit Diagnoses     Altered mental status, unspecified altered mental status type       Relevant Orders   POCT Urinalysis Dipstick (Automated) (Completed)   Urine Culture        Meds ordered this encounter  Medications   traZODone (DESYREL) 150 MG tablet    Sig: Take 0.5 tablets (75 mg total) by mouth at bedtime. *Crushed*    Dispense:  20 tablet    Refill:  0   predniSONE (DELTASONE) 10 MG tablet    Sig: Take 3 tablets (30 mg total) daily for 3 days, THEN 2 tablets (20 mg total) daily for 3 days, THEN 1 tablet (10 mg total) daily for 3 days, THEN 0.5 tablets (5 mg total) daily for 3 days.    Dispense:  20 tablet    Refill:  0    I, Ann Held, DO, personally preformed the services described in this documentation.  All medical record entries made by the scribe were at my direction and in my presence.  I have reviewed the chart and discharge instructions (if applicable) and agree that the record reflects my personal performance and is accurate and complete. 06/15/22     Ann Held, DO

## 2022-06-15 NOTE — Assessment & Plan Note (Signed)
Stable

## 2022-06-15 NOTE — Assessment & Plan Note (Signed)
Pred taper  Refer to derm

## 2022-06-16 ENCOUNTER — Other Ambulatory Visit: Payer: Self-pay

## 2022-06-16 ENCOUNTER — Other Ambulatory Visit (HOSPITAL_COMMUNITY): Payer: Self-pay | Admitting: Family Medicine

## 2022-06-16 ENCOUNTER — Other Ambulatory Visit (HOSPITAL_BASED_OUTPATIENT_CLINIC_OR_DEPARTMENT_OTHER): Payer: Self-pay

## 2022-06-16 DIAGNOSIS — F0630 Mood disorder due to known physiological condition, unspecified: Secondary | ICD-10-CM

## 2022-06-16 LAB — URINE CULTURE
MICRO NUMBER:: 14376838
Result:: NO GROWTH
SPECIMEN QUALITY:: ADEQUATE

## 2022-06-20 DIAGNOSIS — G9341 Metabolic encephalopathy: Secondary | ICD-10-CM | POA: Diagnosis not present

## 2022-06-20 DIAGNOSIS — N184 Chronic kidney disease, stage 4 (severe): Secondary | ICD-10-CM | POA: Diagnosis not present

## 2022-06-20 DIAGNOSIS — S2249XA Multiple fractures of ribs, unspecified side, initial encounter for closed fracture: Secondary | ICD-10-CM | POA: Diagnosis not present

## 2022-06-20 DIAGNOSIS — I6381 Other cerebral infarction due to occlusion or stenosis of small artery: Secondary | ICD-10-CM | POA: Diagnosis not present

## 2022-06-20 DIAGNOSIS — M47816 Spondylosis without myelopathy or radiculopathy, lumbar region: Secondary | ICD-10-CM | POA: Diagnosis not present

## 2022-06-21 ENCOUNTER — Other Ambulatory Visit (HOSPITAL_BASED_OUTPATIENT_CLINIC_OR_DEPARTMENT_OTHER): Payer: Self-pay

## 2022-06-21 DIAGNOSIS — M47816 Spondylosis without myelopathy or radiculopathy, lumbar region: Secondary | ICD-10-CM | POA: Diagnosis not present

## 2022-06-21 DIAGNOSIS — N184 Chronic kidney disease, stage 4 (severe): Secondary | ICD-10-CM | POA: Diagnosis not present

## 2022-06-21 DIAGNOSIS — G9341 Metabolic encephalopathy: Secondary | ICD-10-CM | POA: Diagnosis not present

## 2022-06-21 DIAGNOSIS — I6381 Other cerebral infarction due to occlusion or stenosis of small artery: Secondary | ICD-10-CM | POA: Diagnosis not present

## 2022-06-21 DIAGNOSIS — S2249XA Multiple fractures of ribs, unspecified side, initial encounter for closed fracture: Secondary | ICD-10-CM | POA: Diagnosis not present

## 2022-06-22 ENCOUNTER — Other Ambulatory Visit (HOSPITAL_BASED_OUTPATIENT_CLINIC_OR_DEPARTMENT_OTHER): Payer: Self-pay

## 2022-06-23 ENCOUNTER — Other Ambulatory Visit (HOSPITAL_BASED_OUTPATIENT_CLINIC_OR_DEPARTMENT_OTHER): Payer: Self-pay

## 2022-06-23 ENCOUNTER — Ambulatory Visit (HOSPITAL_COMMUNITY): Payer: PPO | Admitting: Psychiatry

## 2022-06-23 DIAGNOSIS — F0630 Mood disorder due to known physiological condition, unspecified: Secondary | ICD-10-CM | POA: Diagnosis not present

## 2022-06-23 MED ORDER — CARBAMAZEPINE 100 MG/5ML PO SUSP
400.0000 mg | Freq: Every day | ORAL | 3 refills | Status: DC
  Filled 2022-06-23 – 2022-07-12 (×2): qty 600, 30d supply, fill #0
  Filled 2022-08-08: qty 450, 22d supply, fill #1
  Filled 2022-08-31: qty 600, 30d supply, fill #2
  Filled 2022-09-27: qty 600, 30d supply, fill #3

## 2022-06-23 MED ORDER — LOREEV XR 1 MG PO CS24
1.0000 mg | EXTENDED_RELEASE_CAPSULE | Freq: Every day | ORAL | 3 refills | Status: DC
  Filled 2022-06-23 – 2022-07-25 (×2): qty 30, 30d supply, fill #0

## 2022-06-23 MED ORDER — LORAZEPAM 0.5 MG PO TABS
0.7500 mg | ORAL_TABLET | Freq: Every day | ORAL | 4 refills | Status: DC
  Filled 2022-06-23: qty 50, 33d supply, fill #0
  Filled 2022-08-15 – 2022-08-20 (×3): qty 50, 33d supply, fill #1
  Filled 2022-09-19: qty 50, 33d supply, fill #2
  Filled 2022-10-24: qty 50, 33d supply, fill #3

## 2022-06-23 NOTE — Progress Notes (Signed)
       Today the patient is seen with his wife [name].  Today he is doing better.  According to his wife and the patient his irritability is down.  He is not over sedated.  He is having less sundowning.  The patient is sleeping and eating well.  He takes his medicines as prescribed.  Since we increased his Ativan a small amount he clearly is better.  The patient is more cooperative.  He continues in physical therapy.  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  The patient seems calm.  He is friendly and engaging.  He denies being depressed or irritable.  He denies persistent anxiety.  Note is that his Tegretol taking 400 mg of liquid Tegretol gives him a blood level of 6.1.  Today in discussion with his wife we have agreed to leave it there.  It is in the therapeutic range albeit on the lower again.  The combination of Tegretol together with the Ativan as is left the patient being alert engaging helping in his care and no longer agitated.  He is not over sedated.  He is doing better.  He will return to see me in 2-1/2 months.  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  His irritability is well controlled taking Tegretol.  We will continue the present dose giving him liquid Tegretol.  He takes 100 mg or 5 mL in the morning midday and then 10 mL at night.  A total of 400 mg.  Patient will continue taking Ativan taking 0.25 mg in the morning 0.5 mg midday and taking 1 mg of Loreev at the end of the day.  This patient she will return to see me in 2-1/2 months.  His agitation is much better.

## 2022-06-24 ENCOUNTER — Encounter: Payer: Self-pay | Admitting: Family Medicine

## 2022-06-24 ENCOUNTER — Ambulatory Visit (INDEPENDENT_AMBULATORY_CARE_PROVIDER_SITE_OTHER): Payer: PPO | Admitting: Family Medicine

## 2022-06-24 VITALS — BP 120/78 | HR 72 | Temp 97.8°F | Resp 16 | Ht 71.0 in

## 2022-06-24 DIAGNOSIS — H6123 Impacted cerumen, bilateral: Secondary | ICD-10-CM | POA: Diagnosis not present

## 2022-06-24 NOTE — Progress Notes (Signed)
Subjective:   By signing my name below, I, William [name], attest that this documentation has been prepared under the direction and in the presence of William Held, DO. 06/24/2022   Patient ID: William [name], male    DOB: 1941/05/16, 82 y.o.   MRN: [id number]  Chief Complaint  Patient presents with   Cerumen Impaction    HPI Patient is in today for an office visit.  He complains of his ears full of wax. His wife could not use OTC wax removal due to his discomfort.    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   HTN (hypertension) 07/19/2015   Hyperlipidemia    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   Mild, related to stroke history   Nocturia    Renal insufficiency 06/25/2013   S/P carotid endarterectomy    BILATERAL ICA--  PATENT PER DUPLEX  05-19-2012   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   Right calf   Urinary frequency    Vitamin D deficiency     Past Surgical History:  Procedure Laterality Date   APPENDECTOMY  AS CHILD   CARDIOVASCULAR STRESS TEST  03-27-2012  DR CRENSHAW   LOW RISK LEXISCAN STUDY-- PROBABLE NORMAL PERFUSION AND SOFT TISSUE ATTENUATION/  NO ISCHEMIA/ EF 51%   CAROTID ENDARTERECTOMY Bilateral LEFT  11-12-2008  DR GREG HAYES   RIGHT ICA  2006  ([facility])   CHOLECYSTECTOMY N/A 02/23/2022   Procedure: LAPAROSCOPIC CHOLECYSTECTOMY;  Surgeon: Felicie Morn, MD;  Location: [place];  Service: General;  Laterality: N/A;   CYSTOSCOPY W/ RETROGRADES Bilateral 06/22/2021   Procedure: CYSTOSCOPY WITH RETROGRADE PYELOGRAM;   Surgeon: Franchot Gallo, MD;  Location: [place];  Service: Urology;  Laterality: Bilateral;   CYSTOSCOPY WITH LITHOLAPAXY N/A 02/26/2013   Procedure: CYSTOSCOPY WITH LITHOLAPAXY;  Surgeon: Franchot Gallo, MD;  Location: [facility];  Service: Urology;  Laterality: N/A;   ENDOSCOPIC RETROGRADE CHOLANGIOPANCREATOGRAPHY (ERCP) WITH PROPOFOL N/A 02/22/2022   Procedure: ENDOSCOPIC RETROGRADE CHOLANGIOPANCREATOGRAPHY (ERCP) WITH PROPOFOL;  Surgeon: Carol Ada, MD;  Location: [place];  Service: Gastroenterology;  Laterality: N/A;   EYE SURGERY  Jan. 2016   cataract surgery both eyes   INGUINAL HERNIA REPAIR Right 11-08-2006   IR KYPHO EA ADDL LEVEL THORACIC OR LUMBAR  02/12/2021   IR RADIOLOGIST EVAL & MGMT  02/18/2021   MASS EXCISION N/A 03/03/2016   Procedure: EXCISION OF BACK  MASS;  Surgeon: Stark Klein, MD;  Location: [place];  Service: General;  Laterality: N/A;   MOHS SURGERY Left 1/ 2016   Dr William [name]-- Basal cell   PROSTATE SURGERY     REMOVAL OF STONES  02/22/2022   Procedure: REMOVAL OF STONES;  Surgeon: Carol Ada, MD;  Location: [place];  Service: Gastroenterology;;   William [name]  02/22/2022   Procedure: William [name];  Surgeon: Carol Ada, MD;  Location: [place];  Service: Gastroenterology;;   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-C N/A 06/22/2021   Procedure: TRANSURETHRAL RESECTION OF  BLADDER TUMOR;  Surgeon: Franchot Gallo, MD;  Location: [facility];  Service: Urology;  Laterality: N/A;   TRANSURETHRAL RESECTION OF PROSTATE N/A 02/26/2013   Procedure: TRANSURETHRAL RESECTION OF THE PROSTATE WITH GYRUS INSTRUMENTS;  Surgeon: Franchot Gallo, MD;  Location: [facility];  Service: Urology;  Laterality: N/A;   TRANSURETHRAL RESECTION OF PROSTATE N/A 06/22/2021   Procedure: TRANSURETHRAL RESECTION OF THE PROSTATE (TURP);  Surgeon: Franchot Gallo, MD;  Location:  [facility];  Service: Urology;  Laterality: N/A;    Family History  Problem Relation Age of Onset   Heart disease Mother        CHF   Bipolar disorder Mother    Heart disease Father        CHF    Social History   Socioeconomic History   Marital status: Married    Spouse name: Not on file   Number of children: 2   Years of education: 12   Highest education level: High school graduate  Occupational History    Employer: Retired  Tobacco Use   Smoking status: Former    Packs/day: 2.00    Years: 40.00    Total pack years: 80.00    Types: Cigarettes    Quit date: 02/15/2005    Years since quitting: 17.3   Smokeless tobacco: Never  Vaping Use   Vaping Use: Never used  Substance and Sexual Activity   Alcohol use: Not Currently    Comment: Occasional   Drug use: No   Sexual activity: Not Currently    Partners: Female  Other Topics Concern   Not on file  Social History Narrative   Exercise--  Walks with assistance -- using wheelchair more than the walker       LIves with wife , no stairs in home, caffeine - one cup coffee day, exercise - not much, Right handed, 12th grade, retired      One story home   Social Determinants of [place] Strain: Not on file  Food Insecurity: No Food Insecurity (03/02/2022)   Hunger Vital Sign    Worried About Running Out of Food in the Last Year: Never true    [place] in the Last Year: Never true  Transportation Needs: No Transportation Needs (03/02/2022)   PRAPARE - Hydrologist (Medical): No    Lack of Transportation (Non-Medical): No  Physical Activity: Not on file  Stress: Not on file  Social Connections: Not on file  Intimate Partner Violence: Not At Risk (03/02/2022)   Humiliation, Afraid, Rape, and Kick questionnaire    Fear of Current or Ex-Partner: No    Emotionally Abused: No    Physically Abused: No    Sexually Abused: No    Outpatient Medications  Prior to Visit  Medication Sig Dispense Refill   acetaminophen (TYLENOL) 325 MG tablet Take 2 tablets (650 mg total) by mouth every 6 (six) hours as needed for mild pain.     amantadine (SYMMETREL) 100 MG capsule Take 1 capsule (100 mg total) by mouth daily. (Patient taking differently: Take 100 mg by mouth daily. *Crushed*) 90 capsule 1   amLODipine (NORVASC) 10 MG tablet Take 1 tablet (10 mg total) by mouth every morning. (Patient taking differently: Take 10 mg by mouth every morning. *Crushed*) 90 tablet 0   carBAMazepine (TEGRETOL) 100 MG/5ML suspension Take 5 mLs (100 mg total) by mouth in the morning and in the afternoon,  and then 10 mLs (200 mg total) at bedtime. 600 mL 3   Cholecalciferol (VITAMIN D3) 25 MCG (1000 UT) CHEW Chew 1 tablet by mouth daily.     clopidogrel (PLAVIX) 75 MG tablet Take 1 tablet (75 mg total) by mouth daily. (Patient taking differently: Take 75 mg by mouth daily. *Crushed*) 90 tablet 1   clotrimazole-betamethasone (LOTRISONE) cream Apply 1 Application on to the skin daily. 30 g 0   ferrous sulfate 325 (65 FE) MG EC tablet Take 325 mg by mouth daily with breakfast. *Crushed*     fluticasone (CUTIVATE) 4.56 % cream 1 application  daily as needed (psoriasis).  3   hydrALAZINE (APRESOLINE) 25 MG tablet Take 1 tablet (25 mg total) by mouth every 8 (eight) hours. (Patient taking differently: Take 25 mg by mouth every 8 (eight) hours. *Crushed*) 270 tablet 1   hydrOXYzine (VISTARIL) 25 MG capsule Take 1 capsule by mouth everyday at 3:30 PM as needed 60 capsule 2   LORazepam (ATIVAN) 0.5 MG tablet Take 0.5 tablets (0.25 mg total) by mouth daily. *Crushed* 10 tablet 0   LORazepam (ATIVAN) 0.5 MG tablet Take 1/2 tablet (0.25 mg total) by mouth in the morning and take 1 tablet (0.5 mg total) in the afternoon at 4pm. 50 tablet 4   LORazepam ER (LOREEV XR) 1 MG CS24 Take 1 mg by mouth at bedtime. *Crushed* (Patient taking differently: Take 1 mg by mouth at bedtime. *Crushed*) 20  capsule 0   LORazepam ER (LOREEV XR) 1 MG CS24 Take 1 capsule (1 mg total) by mouth at bedtime as directed. 30 capsule 4   LORazepam ER (LOREEV XR) 1 MG CS24 Take 1 mg by mouth at bedtime. *Crushed* 30 capsule 3   Multiple Vitamins-Minerals (ADULT ONE DAILY GUMMIES) CHEW Chew 1 tablet by mouth every morning.     pantoprazole (PROTONIX) 40 MG tablet Take 1 tablet (40 mg total) by mouth daily. (Patient taking differently: Take 40 mg by mouth daily. *Crushed*) 90 tablet 3   polyethylene glycol (MIRALAX / GLYCOLAX) 17 g packet Take 17 g by mouth daily. 14 each 0   predniSONE (DELTASONE) 10 MG tablet Take 3 tablets (30 mg total) daily for 3 days, THEN 2 tablets (20 mg total) daily for 3 days, THEN 1 tablet (10 mg total) daily for 3 days, THEN 0.5 tablets (5 mg total) daily for 3 days. 20 tablet 0   tamsulosin (FLOMAX) 0.4 MG CAPS capsule Take 1 capsule (0.4 mg total) by mouth daily after supper. (Patient taking differently: Take 0.4 mg by mouth daily after supper. *Crushed*) 90 capsule 1   traZODone (DESYREL) 150 MG tablet Take 0.5 tablets (75 mg total) by mouth at bedtime. *Crushed* 20 tablet 0   triamcinolone cream (KENALOG) 0.1 % Apply 1 application topically 2 (two) times daily. (Patient taking differently: Apply 1 Application topically 2 (two) times daily as needed (rash).) 30 g 0   Facility-Administered Medications Prior to Visit  Medication Dose Route Frequency Provider Last Rate Last Admin   gemcitabine (GEMZAR) chemo syringe for bladder instillation 2,000 mg  2,000 mg Bladder Instillation Once Franchot Gallo, MD        Allergies  Allergen Reactions   Bee Venom Anaphylaxis   Strawberry Extract Hives   Latex Itching   Zetia [Ezetimibe] Other (See Comments)    Intolerance    Adhesive [Tape] Other (See Comments)    blisters   Statins Other (See Comments)    myalgias    Review of  Systems  Constitutional:  Negative for fever and malaise/fatigue.  HENT:  Positive for hearing loss.  Negative for congestion.        (+)impacted TM bilateral  Eyes:  Negative for blurred vision.  Respiratory:  Negative for shortness of breath.   Cardiovascular:  Negative for chest pain, palpitations and leg swelling.  Gastrointestinal:  Negative for abdominal pain, blood in stool and nausea.  Genitourinary:  Negative for dysuria and frequency.  Musculoskeletal:  Negative for falls.  Skin:  Negative for rash.  Neurological:  Negative for dizziness, loss of consciousness and headaches.  Endo/Heme/Allergies:  Negative for environmental allergies.  Psychiatric/Behavioral:  Negative for depression. The patient is not nervous/anxious.        Objective:    Physical Exam Vitals and nursing note reviewed.  Constitutional:      Appearance: Normal appearance.  HENT:     Head: Normocephalic and atraumatic.     Right Ear: External ear normal. There is impacted cerumen.     Left Ear: External ear normal. There is impacted cerumen.     Ears:     Comments: Unable to remove with cerumen hoop Irrigated with no compkcations and hearing improved  Canal and tm normal b/l  Eyes:     Extraocular Movements: Extraocular movements intact.     Pupils: Pupils are equal, round, and reactive to light.  Cardiovascular:     Rate and Rhythm: Normal rate and regular rhythm.     Heart sounds: Normal heart sounds. No murmur heard.    No gallop.  Pulmonary:     Effort: Pulmonary effort is normal. No respiratory distress.     Breath sounds: Normal breath sounds. No wheezing or rales.  Skin:    General: Skin is warm and dry.  Neurological:     Mental Status: He is alert and oriented to person, place, and time.  Psychiatric:        Judgment: Judgment normal.     BP 120/78 (BP Location: Left Arm, Patient Position: Sitting, Cuff Size: Normal)   Pulse 72   Temp 97.8 F (36.6 C) (Oral)   Resp 16   Ht '5\' 11"'$  (1.803 m)   SpO2 99%   BMI 25.10 kg/m  Wt Readings from Last 3 Encounters:  02/26/22 180 lb  (81.6 kg)  02/19/22 196 lb (88.9 kg)  01/04/22 193 lb 3.2 oz (87.6 kg)       Assessment & Plan:  Bilateral impacted cerumen  Irrigated wih no complications  Hearing improved   I, William Held, DO, personally preformed the services described in this documentation.  All medical record entries made by the scribe were at my direction and in my presence.  I have reviewed the chart and discharge instructions (if applicable) and agree that the record reflects my personal performance and is accurate and complete. 06/24/2022   I,William [name],acting as a scribe for William Held, DO.,have documented all relevant documentation on the behalf of William Held, DO,as directed by  William Held, DO while in the presence of William Held, DO.  William Held, DO

## 2022-06-24 NOTE — Patient Instructions (Signed)
Earwax Buildup, Adult The ears produce a substance called earwax that helps keep bacteria out of the ear and protects the skin in the ear canal. Occasionally, earwax can build up in the ear and cause discomfort or hearing loss. What are the causes? This condition is caused by a buildup of earwax. Ear canals are self-cleaning. Ear wax is made in the outer part of the ear canal and generally falls out in small amounts over time. When the self-cleaning mechanism is not working, earwax builds up and can cause decreased hearing and discomfort. Attempting to clean ears with cotton swabs can push the earwax deep into the ear canal and cause decreased hearing and pain. What increases the risk? This condition is more likely to develop in people who: Clean their ears often with cotton swabs. Pick at their ears. Use earplugs or in-ear headphones often, or wear hearing aids. The following factors may also make you more likely to develop this condition: Being male. Being of older age. Naturally producing more earwax. Having narrow ear canals. Having earwax that is overly thick or sticky. Having excess hair in the ear canal. Having eczema. Being dehydrated. What are the signs or symptoms? Symptoms of this condition include: Reduced or muffled hearing. A feeling of fullness in the ear or feeling that the ear is plugged. Fluid coming from the ear. Ear pain or an itchy ear. Ringing in the ear. Coughing. Balance problems. An obvious piece of earwax that can be seen inside the ear canal. How is this diagnosed? This condition may be diagnosed based on: Your symptoms. Your medical history. An ear exam. During the exam, your health care provider will look into your ear with an instrument called an otoscope. You may have tests, including a hearing test. How is this treated? This condition may be treated by: Using ear drops to soften the earwax. Having the earwax removed by a health care provider. The  health care provider may: Flush the ear with water. Use an instrument that has a loop on the end (curette). Use a suction device. Having surgery to remove the wax buildup. This may be done in severe cases. Follow these instructions at home:  Take over-the-counter and prescription medicines only as told by your health care provider. Do not put any objects, including cotton swabs, into your ear. You can clean the opening of your ear canal with a washcloth or facial tissue. Follow instructions from your health care provider about cleaning your ears. Do not overclean your ears. Drink enough fluid to keep your urine pale yellow. This will help to thin the earwax. Keep all follow-up visits as told. If earwax builds up in your ears often or if you use hearing aids, consider seeing your health care provider for routine, preventive ear cleanings. Ask your health care provider how often you should schedule your cleanings. If you have hearing aids, clean them according to instructions from the manufacturer and your health care provider. Contact a health care provider if: You have ear pain. You develop a fever. You have pus or other fluid coming from your ear. You have hearing loss. You have ringing in your ears that does not go away. You feel like the room is spinning (vertigo). Your symptoms do not improve with treatment. Get help right away if: You have bleeding from the affected ear. You have severe ear pain. Summary Earwax can build up in the ear and cause discomfort or hearing loss. The most common symptoms of this condition include   reduced or muffled hearing, a feeling of fullness in the ear, or feeling that the ear is plugged. This condition may be diagnosed based on your symptoms, your medical history, and an ear exam. This condition may be treated by using ear drops to soften the earwax or by having the earwax removed by a health care provider. Do not put any objects, including cotton  swabs, into your ear. You can clean the opening of your ear canal with a washcloth or facial tissue. This information is not intended to replace advice given to you by your health care provider. Make sure you discuss any questions you have with your health care provider. Document Revised: 09/18/2019 Document Reviewed: 09/18/2019 [employer] Patient Education  2023 [employer]  

## 2022-06-29 ENCOUNTER — Encounter: Payer: Self-pay | Admitting: Family Medicine

## 2022-06-29 ENCOUNTER — Other Ambulatory Visit: Payer: Self-pay | Admitting: Family Medicine

## 2022-06-29 DIAGNOSIS — W19XXXD Unspecified fall, subsequent encounter: Secondary | ICD-10-CM

## 2022-06-29 DIAGNOSIS — I639 Cerebral infarction, unspecified: Secondary | ICD-10-CM

## 2022-06-30 DIAGNOSIS — S32019D Unspecified fracture of first lumbar vertebra, subsequent encounter for fracture with routine healing: Secondary | ICD-10-CM | POA: Diagnosis not present

## 2022-06-30 DIAGNOSIS — M5430 Sciatica, unspecified side: Secondary | ICD-10-CM | POA: Diagnosis not present

## 2022-06-30 DIAGNOSIS — I69359 Hemiplegia and hemiparesis following cerebral infarction affecting unspecified side: Secondary | ICD-10-CM | POA: Diagnosis not present

## 2022-06-30 DIAGNOSIS — G8929 Other chronic pain: Secondary | ICD-10-CM | POA: Diagnosis not present

## 2022-06-30 DIAGNOSIS — R0902 Hypoxemia: Secondary | ICD-10-CM | POA: Diagnosis not present

## 2022-07-01 ENCOUNTER — Encounter: Payer: Self-pay | Admitting: Family Medicine

## 2022-07-01 ENCOUNTER — Ambulatory Visit (INDEPENDENT_AMBULATORY_CARE_PROVIDER_SITE_OTHER): Payer: PPO | Admitting: Family Medicine

## 2022-07-01 ENCOUNTER — Other Ambulatory Visit (HOSPITAL_BASED_OUTPATIENT_CLINIC_OR_DEPARTMENT_OTHER): Payer: Self-pay

## 2022-07-01 VITALS — BP 140/78 | HR 71 | Resp 18 | Ht <= 58 in

## 2022-07-01 DIAGNOSIS — I639 Cerebral infarction, unspecified: Secondary | ICD-10-CM

## 2022-07-01 DIAGNOSIS — W19XXXD Unspecified fall, subsequent encounter: Secondary | ICD-10-CM

## 2022-07-01 DIAGNOSIS — I679 Cerebrovascular disease, unspecified: Secondary | ICD-10-CM | POA: Diagnosis not present

## 2022-07-01 DIAGNOSIS — R519 Headache, unspecified: Secondary | ICD-10-CM | POA: Insufficient documentation

## 2022-07-01 DIAGNOSIS — I10 Essential (primary) hypertension: Secondary | ICD-10-CM | POA: Diagnosis not present

## 2022-07-01 MED ORDER — TIZANIDINE HCL 4 MG PO TABS
4.0000 mg | ORAL_TABLET | Freq: Four times a day (QID) | ORAL | 0 refills | Status: DC | PRN
  Filled 2022-07-01: qty 30, 8d supply, fill #0

## 2022-07-01 NOTE — Progress Notes (Signed)
Subjective:   By signing my name below, I, [name], attest that this documentation has been prepared under the direction and in the presence of Ann Held, DO. 07/01/2022   Patient ID: William [name], male    DOB: 05-Nov-1940, 82 y.o.   MRN: [id number]  Chief Complaint  Patient presents with   Headache    Pt states headache has gone away. Pt had a headache since Monday.     Headache  Pertinent negatives include no abdominal pain, blurred vision, dizziness, fever or nausea.   Patient is in today for a office visit. His wife is present with him during this visit.   He reports having a headache on Monday-Wednesday but it has resolved today. His blood pressure measured normal the past couple of days. He has therapist come to his house who worked on his neck. He had no issues sleeping prior to his headache. He was taking tylenol when having a headache but his symptoms did not change. His headache pain was a 6-7/10. His wife reports he is repeating words more often. He is also having more movement in his hands.    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   HTN (hypertension) 07/19/2015   Hyperlipidemia    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   Mild, related to stroke history   Nocturia    Renal insufficiency 06/25/2013   S/P carotid endarterectomy    BILATERAL ICA--  PATENT PER DUPLEX  05-19-2012   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   Right calf   Urinary frequency    Vitamin D deficiency     Past Surgical History:  Procedure Laterality  Date   APPENDECTOMY  AS CHILD   CARDIOVASCULAR STRESS TEST  03-27-2012  DR CRENSHAW   LOW RISK LEXISCAN STUDY-- PROBABLE NORMAL PERFUSION AND SOFT TISSUE ATTENUATION/  NO ISCHEMIA/ EF 51%   CAROTID ENDARTERECTOMY Bilateral LEFT  11-12-2008  DR GREG HAYES   RIGHT ICA  2006  ([facility])   CHOLECYSTECTOMY N/A 02/23/2022   Procedure: LAPAROSCOPIC CHOLECYSTECTOMY;  Surgeon: Felicie Morn, MD;  Location: [place];  Service: General;  Laterality: N/A;   CYSTOSCOPY W/ RETROGRADES Bilateral 06/22/2021   Procedure: CYSTOSCOPY WITH RETROGRADE PYELOGRAM;  Surgeon: Franchot Gallo, MD;  Location: [place];  Service: Urology;  Laterality: Bilateral;   CYSTOSCOPY WITH LITHOLAPAXY N/A 02/26/2013   Procedure: CYSTOSCOPY WITH LITHOLAPAXY;  Surgeon: Franchot Gallo, MD;  Location: [facility];  Service: Urology;  Laterality: N/A;   ENDOSCOPIC RETROGRADE CHOLANGIOPANCREATOGRAPHY (ERCP) WITH PROPOFOL N/A 02/22/2022   Procedure: ENDOSCOPIC RETROGRADE CHOLANGIOPANCREATOGRAPHY (ERCP) WITH PROPOFOL;  Surgeon: Carol Ada, MD;  Location: [place];  Service: Gastroenterology;  Laterality: N/A;   EYE SURGERY  Jan. 2016   cataract surgery both eyes   INGUINAL HERNIA REPAIR Right 11-08-2006   IR KYPHO EA ADDL LEVEL THORACIC OR LUMBAR  02/12/2021   IR RADIOLOGIST EVAL & MGMT  02/18/2021   MASS EXCISION N/A 03/03/2016   Procedure: EXCISION OF BACK  MASS;  Surgeon: Stark Klein, MD;  Location: [place];  Service: General;  Laterality: N/A;   MOHS SURGERY Left 1/ 2016   Dr Nevada Crane-- Basal cell   PROSTATE SURGERY     REMOVAL OF STONES  02/22/2022   Procedure: REMOVAL OF STONES;  Surgeon: Carol Ada, MD;  Location[facility] END[facility]COPY;  Service: Gastroenterology;;   [name]  02/22/2022   Procedure: [name];  Surgeon: Carol Ada, MD;  Location: [facility] ENDOSCOPY;  Service: Gastroenterology;;   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-C N/A 06/22/2021    Procedure: TRANSURETHRAL RESECTION OF BLADDER TUMOR;  Surgeon: Franchot Gallo, MD;  Location: [facility];  Service: Urology;  Laterality: N/A;   TRANSURETHRAL RESECTION OF PROSTATE N/A 02/26/2013   Procedure: TRANSURETHRAL RESECTION OF THE PROSTATE WITH GYRUS INSTRUMENTS;  Surgeon: Franchot Gallo, MD;  Location: [facility];  Service: Urology;  Laterality: N/A;   TRANSURETHRAL RESECTION OF PROSTATE N/A 06/22/2021   Procedure: TRANSURETHRAL RESECTION OF THE PROSTATE (TURP);  Surgeon: Franchot Gallo, MD;  Location: [facility];  Service: Urology;  Laterality: N/A;    Family History  Problem Relation Age of Onset   Heart disease Mother        CHF   Bipolar disorder Mother    Heart disease Father        CHF    Social History   Socioeconomic History   Marital status: Married    Spouse name: Not on file   Number of children: 2   Years of education: 12   Highest education level: High school graduate  Occupational History    Employer: Retired  Tobacco Use   Smoking status: Former    Packs/day: 2.00    Years: 40.00    Total pack years: 80.00    Types: Cigarettes    Quit date: 02/15/2005    Years since quitting: 17.3   Smokeless tobacco: Never  Vaping Use   Vaping Use: Never used  Substance and Sexual Activity   Alcohol use: Not Currently    Comment: Occasional   Drug use: No   Sexual activity: Not Currently    Partners: Female  Other Topics Concern   Not on file  Social History Narrative   Exercise--  Walks with assistance -- using wheelchair more than the walker       LIves with wife , no stairs in home, caffeine - one cup coffee day, exercise - not much, Right handed, 12th grade, retired      One story home   Social Determinants of [place] Strain: Not on file  Food Insecurity: No Food Insecurity (03/02/2022)   Hunger Vital Sign    Worried About Running Out of Food in the Last Year: Never true     [place] in the Last Year: Never true  Transportation Needs: No Transportation Needs (03/02/2022)   PRAPARE - Hydrologist (Medical): No    Lack of Transportation (Non-Medical): No  Physical Activity: Not on file  Stress: Not on file  Social Connections: Not on file  Intimate Partner Violence: Not At Risk (03/02/2022)   Humiliation, Afraid, Rape, and Kick questionnaire    Fear of Current or Ex-Partner: No    Emotionally Abused: No    Physically Abused: No    Sexually Abused: No    Outpatient Medications Prior to Visit  Medication Sig Dispense Refill   acetaminophen (TYLENOL) 325 MG tablet Take  2 tablets (650 mg total) by mouth every 6 (six) hours as needed for mild pain.     amantadine (SYMMETREL) 100 MG capsule Take 1 capsule (100 mg total) by mouth daily. (Patient taking differently: Take 100 mg by mouth daily. *Crushed*) 90 capsule 1   amLODipine (NORVASC) 10 MG tablet Take 1 tablet (10 mg total) by mouth every morning. (Patient taking differently: Take 10 mg by mouth every morning. *Crushed*) 90 tablet 0   carBAMazepine (TEGRETOL) 100 MG/5ML suspension Take 5 mLs (100 mg total) by mouth in the morning and in the afternoon, and then 10 mLs (200 mg total) at bedtime. 600 mL 3   Cholecalciferol (VITAMIN D3) 25 MCG (1000 UT) CHEW Chew 1 tablet by mouth daily.     clopidogrel (PLAVIX) 75 MG tablet Take 1 tablet (75 mg total) by mouth daily. (Patient taking differently: Take 75 mg by mouth daily. *Crushed*) 90 tablet 1   clotrimazole-betamethasone (LOTRISONE) cream Apply 1 Application on to the skin daily. 30 g 0   ferrous sulfate 325 (65 FE) MG EC tablet Take 325 mg by mouth daily with breakfast. *Crushed*     fluticasone (CUTIVATE) 4.09 % cream 1 application  daily as needed (psoriasis).  3   hydrALAZINE (APRESOLINE) 25 MG tablet Take 1 tablet (25 mg total) by mouth every 8 (eight) hours. (Patient taking differently: Take 25 mg by mouth every 8 (eight)  hours. *Crushed*) 270 tablet 1   hydrOXYzine (VISTARIL) 25 MG capsule Take 1 capsule by mouth everyday at 3:30 PM as needed 60 capsule 2   LORazepam (ATIVAN) 0.5 MG tablet Take 0.5 tablets (0.25 mg total) by mouth daily. *Crushed* 10 tablet 0   LORazepam (ATIVAN) 0.5 MG tablet Take 1/2 tablet (0.25 mg total) by mouth in the morning and take 1 tablet (0.5 mg total) in the afternoon at 4pm. 50 tablet 4   LORazepam ER (LOREEV XR) 1 MG CS24 Take 1 mg by mouth at bedtime. *Crushed* (Patient taking differently: Take 1 mg by mouth at bedtime. *Crushed*) 20 capsule 0   LORazepam ER (LOREEV XR) 1 MG CS24 Take 1 capsule (1 mg total) by mouth at bedtime as directed. 30 capsule 4   LORazepam ER (LOREEV XR) 1 MG CS24 Take 1 mg by mouth at bedtime. *Crushed* 30 capsule 3   Multiple Vitamins-Minerals (ADULT ONE DAILY GUMMIES) CHEW Chew 1 tablet by mouth every morning.     pantoprazole (PROTONIX) 40 MG tablet Take 1 tablet (40 mg total) by mouth daily. (Patient taking differently: Take 40 mg by mouth daily. *Crushed*) 90 tablet 3   polyethylene glycol (MIRALAX / GLYCOLAX) 17 g packet Take 17 g by mouth daily. 14 each 0   tamsulosin (FLOMAX) 0.4 MG CAPS capsule Take 1 capsule (0.4 mg total) by mouth daily after supper. (Patient taking differently: Take 0.4 mg by mouth daily after supper. *Crushed*) 90 capsule 1   traZODone (DESYREL) 150 MG tablet Take 0.5 tablets (75 mg total) by mouth at bedtime. *Crushed* 20 tablet 0   triamcinolone cream (KENALOG) 0.1 % Apply 1 application topically 2 (two) times daily. (Patient taking differently: Apply 1 Application topically 2 (two) times daily as needed (rash).) 30 g 0   Facility-Administered Medications Prior to Visit  Medication Dose Route Frequency Provider Last Rate Last Admin   gemcitabine (GEMZAR) chemo syringe for bladder instillation 2,000 mg  2,000 mg Bladder Instillation Once Franchot Gallo, MD        Allergies  Allergen Reactions  Bee Venom Anaphylaxis    Strawberry Extract Hives   Latex Itching   Zetia [Ezetimibe] Other (See Comments)    Intolerance    Adhesive [Tape] Other (See Comments)    blisters   Statins Other (See Comments)    myalgias    Review of Systems  Constitutional:  Negative for fever and malaise/fatigue.  HENT:  Negative for congestion.   Eyes:  Negative for blurred vision.  Respiratory:  Negative for shortness of breath.   Cardiovascular:  Negative for chest pain, palpitations and leg swelling.  Gastrointestinal:  Negative for abdominal pain, blood in stool and nausea.  Genitourinary:  Negative for dysuria and frequency.  Musculoskeletal:  Negative for falls.  Skin:  Negative for rash.  Neurological:  Negative for dizziness, loss of consciousness and headaches.  Endo/Heme/Allergies:  Negative for environmental allergies.  Psychiatric/Behavioral:  Negative for depression. The patient is not nervous/anxious.        Objective:    Physical Exam Vitals and nursing note reviewed.  Constitutional:      General: He is not in acute distress.    Appearance: Normal appearance. He is not ill-appearing.  HENT:     Head: Normocephalic and atraumatic.     Right Ear: External ear normal.     Left Ear: External ear normal.  Eyes:     Extraocular Movements: Extraocular movements intact.     Pupils: Pupils are equal, round, and reactive to light.  Cardiovascular:     Rate and Rhythm: Normal rate and regular rhythm.     Heart sounds: Normal heart sounds. No murmur heard.    No gallop.  Pulmonary:     Effort: Pulmonary effort is normal. No respiratory distress.     Breath sounds: Normal breath sounds. No wheezing or rales.  Skin:    General: Skin is warm and dry.  Neurological:     Mental Status: He is alert and oriented to person, place, and time.  Psychiatric:        Mood and Affect: Mood normal.        Judgment: Judgment normal.     BP (!) 140/78 (BP Location: Right Arm, Patient Position: Sitting, Cuff Size:  Normal)   Pulse 71   Resp 18   Ht 1' (0.305 m)   SpO2 99%   BMI 878.85 kg/m  Wt Readings from Last 3 Encounters:  02/26/22 180 lb (81.6 kg)  02/19/22 196 lb (88.9 kg)  01/04/22 193 lb 3.2 oz (87.6 kg)       Assessment & Plan:  Cerebrovascular accident (CVA), unspecified mechanism ([place]) -     Ambulatory referral to Occupational Therapy  Fall, subsequent encounter -     Ambulatory referral to Occupational Therapy  Nonintractable headache, unspecified chronicity pattern, unspecified headache type -     tiZANidine HCl; Take 1 tablet (4 mg total) by mouth every 6 (six) hours as needed for muscle spasms.  Dispense: 30 tablet; Refill: 0    I, Ann Held, DO, personally preformed the services described in this documentation.  All medical record entries made by the scribe were at my direction and in my presence.  I have reviewed the chart and discharge instructions (if applicable) and agree that the record reflects my personal performance and is accurate and complete. 07/01/2022   I,[name],acting as a scribe for Ann Held, DO.,have documented all relevant documentation on the behalf of Ann Held, DO,as directed by  Ann Held,  DO while in the presence of Ann Held, DO.   Ann Held, DO

## 2022-07-01 NOTE — Assessment & Plan Note (Signed)
Pt has app with dr Tomi Likens next week Headache resolved today

## 2022-07-05 NOTE — Therapy (Signed)
OUTPATIENT OCCUPATIONAL THERAPY NEURO EVALUATION  Patient Name: William [name] MRN: [id number] DOB:Feb 01, 1941, 82 y.o., male Today's Date: 07/07/2022  PCP & REFERRING PROVIDER:  Carollee Herter, Alferd Apa, DO    END OF SESSION:  OT End of Session - 07/07/22 1552     Visit Number 1    Number of Visits 12    Date for OT Re-Evaluation 08/20/22    Authorization Type [employer]    OT Start Time [id number]    OT Stop Time 1708    OT Time Calculation (min) 78 min    Activity Tolerance Patient tolerated treatment well;Patient limited by fatigue;Patient limited by lethargy;Other (comment)   Poor cognition and complex presentation   Behavior During Therapy Restless;Impulsive             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   HTN (hypertension) 07/19/2015   Hyperlipidemia    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   Mild, related to stroke history   Nocturia    Renal insufficiency 06/25/2013   S/P carotid endarterectomy    BILATERAL ICA--  PATENT PER DUPLEX  05-19-2012   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/26/2017   Right calf   Urinary frequency    Vitamin D deficiency    Past Surgical History:  Procedure Laterality Date   APPENDECTOMY  AS CHILD   CARDIOVASCULAR STRESS TEST  03-27-2012  DR CRENSHAW   LOW RISK LEXISCAN STUDY-- PROBABLE NORMAL PERFUSION AND SOFT TISSUE ATTENUATION/  NO ISCHEMIA/ EF 51%   CAROTID ENDARTERECTOMY Bilateral LEFT  11-12-2008  DR GREG HAYES   RIGHT ICA  2006  ([facility])   CHOLECYSTECTOMY N/A 02/23/2022   Procedure:  LAPAROSCOPIC CHOLECYSTECTOMY;  Surgeon: Felicie Morn, MD;  Location: [facility];  Service: General;  Laterality: N/A;   CYSTOSCOPY W/ RETROGRADES Bilateral 06/22/2021   Procedure: CYSTOSCOPY WITH RETROGRADE PYELOGRAM;  Surgeon: Franchot Gallo, MD;  Location: [place];  Service: Urology;  Laterality: Bilateral;   CYSTOSCOPY WITH LITHOLAPAXY N/A 02/26/2013   Procedure: CYSTOSCOPY WITH LITHOLAPAXY;  Surgeon: Franchot Gallo, MD;  Location: [facility];  Service: Urology;  Laterality: N/A;   ENDOSCOPIC RETROGRADE CHOLANGIOPANCREATOGRAPHY (ERCP) WITH PROPOFOL N/A 02/22/2022   Procedure: ENDOSCOPIC RETROGRADE CHOLANGIOPANCREATOGRAPHY (ERCP) WITH PROPOFOL;  Surgeon: Carol Ada, MD;  Location: [place];  Service: Gastroenterology;  Laterality: N/A;   EYE SURGERY  Jan. 2016   cataract surgery both eyes   INGUINAL HERNIA REPAIR Right 11-08-2006   IR KYPHO EA ADDL LEVEL THORACIC OR LUMBAR  02/12/2021   IR RADIOLOGIST EVAL & MGMT  02/18/2021   MASS EXCISION N/A 03/03/2016   Procedure: EXCISION OF BACK  MASS;  Surgeon: Stark Klein, MD;  Location: [place];  Service: General;  Laterality: N/A;   MOHS SURGERY Left 1/ 2016   Dr Nevada Crane-- Basal cell   PROSTATE SURGERY     REMOVAL OF STONES  02/22/2022   Procedure: REMOVAL OF STONES;  Surgeon: Carol Ada, MD;  Location: [place];  Service:  Gastroenterology;;   [name]  02/22/2022   Procedure: [name];  Surgeon: Carol Ada, MD;  Location: [facility] ENDOSCOPY;  Service: Gastroenterology;;   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-C N/A 06/22/2021   Procedure: TRANSURETHRAL RESECTION OF BLADDER TUMOR;  Surgeon: Franchot Gallo, MD;  Location: [facility];  Service: Urology;  Laterality: N/A;   TRANSURETHRAL RESECTION OF PROSTATE N/A 02/26/2013   Procedure: TRANSURETHRAL RESECTION OF THE PROSTATE WITH GYRUS INSTRUMENTS;  Surgeon: Franchot Gallo, MD;  Location:  [facility];  Service: Urology;  Laterality: N/A;   TRANSURETHRAL RESECTION OF PROSTATE N/A 06/22/2021   Procedure: TRANSURETHRAL RESECTION OF THE PROSTATE (TURP);  Surgeon: Franchot Gallo, MD;  Location[facility];  Service: Urology;  Laterality: N/A;   Patient Active Problem List   Diagnosis Date Noted   Nonintractable headache 07/01/2022   Bilateral impacted cerumen 06/24/2022   Rash 06/15/2022   Postoperative ileus ([place]) 02/27/2022   Ileus, postoperative ([place]) 02/26/2022   Choledocholithiasis 02/19/2022   DNR (do not resuscitate) 02/19/2022   Left elbow pain 01/26/2022   Mid back pain on left side 01/26/2022   Rib pain 01/26/2022   Acute pain of left shoulder 11/12/2021   Leukocytes in urine 11/12/2021   Urinary frequency 11/12/2021   Thrush 10/08/2021   Hemiplegia, dominant side S/P CVA (cerebrovascular accident) ([place]) 09/11/2021   Insomnia    Prediabetes    Acute 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 ([place])    Basal ganglia infarction ([place]) 07/29/2021   Transaminitis 07/27/2021   UTI (urinary tract infection) 07/27/2021   CVA (cerebral vascular accident) ([place]) 07/27/2021   Fall 07/27/2021   Hyperglycemia 07/27/2021   Cholelithiasis 07/27/2021   Hypoxia 07/27/2021   Nausea and vomiting 67/20/9470   Acute metabolic encephalopathy 96/28/3662   Normocytic anemia 07/27/2021   Chronic back pain 07/27/2021   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[place]) 06/22/2021   Closed fracture of first lumbar vertebra with routine healing 02/03/2021   Closed fracture of multiple ribs 11/18/2020   Anxiety 01/29/2020   Leg pain, bilateral 01/29/2020   Ingrown toenail 07/13/2019   Lumbar spondylosis 05/02/2018   Pain in left knee 03/09/2018   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   Trochanteric bursitis of left hip 01/16/2018   Preventative health care 09/26/2017   HTN (hypertension) 07/19/2015   Hyperlipidemia  07/19/2015   Great toe pain 02/11/2014   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   Obesity (BMI 30-39.9) 06/25/2013   Renal insufficiency 06/25/2013   Weakness of left arm 06/25/2013   Sebaceous cyst 03/03/2011   Sprain of lumbar region 07/31/2010   Rib pain, left 08/29/2009   Carotid artery stenosis, asymptomatic, bilateral 05/02/2009   Eczema, atopic 05/31/2008   Vitamin D deficiency 03/01/2008   BPH (benign prostatic hyperplasia) 08/06/2007   Fasting hyperglycemia 12/21/2006   History of right MCA infarct 2006    ONSET DATE: Acute exacerbation of chronic symptoms (most recent hospitalization 02/26/22 - 03/12/22, followed by SNF and home health)   REFERRING DIAG:  I63.9 (ICD-10-CM) - Cerebrovascular accident (CVA), unspecified mechanism ([place])  W19.XXXD (ICD-10-CM) - Fall, subsequent encounter    THERAPY DIAG:  Attention and concentration deficit  Other lack of coordination  Other symptoms and signs involving cognitive functions following cerebral infarction  Muscle weakness (generalized)  Hemiplegia and hemiparesis following cerebral infarction affecting left non-dominant side (HCC)  Cognitive communication deficit  Rationale for Evaluation and Treatment: Rehabilitation  SUBJECTIVE:   SUBJECTIVE STATEMENT: He is  communicative and appropriate, though appears easily confused and distracted, not fully oriented to time and place (Ax2, states date is 57 and cannot say what city he lives in). His wife provides most details, and he is participative, though a poor historian at times. In a nut shell- he has been suffering from dementia and post-stroke effects for years, and his issues were recently exacerbated in Sept of last year after abdominal surgery. He has less use of Lt "stroke side" and new, ritualistic "ticks" in mouth and Rt hand/arm.  Wife states that they do have a caregiver come M, W, F for 4 hours in the middle of the day to mainly help him bathe and dress,  which usually tires him out. He has been more combative towards the end of the day as well.    Pt accompanied by:  his wife  PERTINENT HISTORY: PMHx includes L basal ganglia and corona radiata infarct 07/27/21, L lower homonymous quadrantanopia; R MCA infarct in 2006 w/ L-sided hemiparesis, NCD, carotid artery disease (bilateral), HTN, HLD,CKD3, vascular dementia, and hx of bladder cancer. He has had falls in 2023, fracturing his clavicle in August, then he had stomach surgery in Sept 2023. He has He needs assist at baseline for ADLs. When last documented in acute rehab after having his gall-bladder removed, (03/09/22) he required moderate to maximum assist x2 for most ADLs (more assist for ADLs the require standing), and they recommended rehab in skilled nursing setting. He apparently got Covid at some point and also attended a SNF. Once he D/C home, he had some episodes of combativeness at night  with "sun-downing."   PRECAUTIONS: Fall and Other: Lt hemi, poor cognition, LATEX and adhesive allergies, etc.   ;WEIGHT BEARING RESTRICTIONS: No  PAIN:  Are you having pain? Not now at rest Rating: 0/10 at rest now, up to 6-7/10 at worst in past week in Lt hip or Rt shoulder when using/reaching   FALLS: Has patient fallen in last 6 months? Yes. Number of falls at least 1 resulting in clavicle fx (now healed)   LIVING ENVIRONMENT: Lives with: lives with their spouse Lives in: House/apartment Has following equipment at home: manual w/c, 2 w/w, weighted utensils, shower bench (for tub), bed cane, hospital bed, raised toilet, and possibly more  PLOF: Needs assistance with ADLs, Needs assistance with homemaking, Needs assistance with gait, and Needs assistance with transfers  PATIENT GOALS: Top caregiver goals today:  #1- increase personal hygiene skills (brush teeth, shaving) #2- prepare instant coffee or microwave meal #3- improve functional cognitive skills to play games, etc.,  #4- increase Lt arm  usage #5- try to help decrease Rt hand/arm perseveration (flicking, tapping, noise making) #6- Improve fnl tranfers in/out of multiple seats (tub bench, swivel chair, etc.)    OBJECTIVE: (All objective assessments below are from initial evaluation on: 07/07/22 unless otherwise specified.)    HAND DOMINANCE: Right  ADLs: Overall ADLs: Wife states decreased ability since hospitalization in Sept 23.  Transfers/ambulation related to ADLs: Min A transfer, CGA for mobility Eating: set up Grooming: Mod A- needs assist with oral care, shaving, but can comb his hair UB Dressing: Min A  LB Dressing: Dep  Toileting:  Mod  Bathing: Mod  Tub Shower transfers: Min A  Equipment: Radio broadcast assistant, Grab bars, and Feeding equipment  IADLs: completely dependent for years now, except some ability to use microwave which is now not possible after hospitalization   MOBILITY STATUS: Needs Assist: CGA - Min A, Hx  of falls, Festination, difficulty with turns, and neglect on Lt side  POSTURE COMMENTS:  rounded shoulders, forward head, and weight shift left   ACTIVITY TOLERANCE: Activity tolerance: he became tired and somewhat agitated by the end of the OT evaluation today (after PT and OT about 2.5hours with mostly sedate activities in seated positions)  FUNCTIONAL OUTCOME MEASURES: [facility] 17/30  showing problems with visuospatial/executive function, attention, fluency, and orientation. (at least 26/30 is needed to be considered "normal")  [employer] in ADLs: 1/6  (very dependent)  UPPER EXTREMITY ROM:    Active ROM Right eval Left eval  Shoulder scaption 100 85  Shoulder abduction    Shoulder adduction    Shoulder extension    Shoulder internal rotation Reaches small of back Reaches to hip  Shoulder external rotation Reaches back of head  Reaches up to ear  Elbow flexion 150 130  Elbow extension (-10*)  (-35*)  Wrist flexion 35 20  Wrist extension 50 10  (Blank rows = not  tested)  UPPER EXTREMITY MMT:    Tested grossly as "push" and "pull": UE push 4-/5, UE pull 4/5  HAND FUNCTION: Grip strength: Right: 30 lbs; Left: 20 lbs  COORDINATION: Finger Nose Finger test: mildly ataxic but fairly accurate  Box and Blocks:  Right 11blocks, Left 5blocks  SENSATION: LT intact, no c/o today   EDEMA: none noticed   MUSCLE TONE:  Rt- normal, Lt- decreased and worsened by inattention/disuse  COGNITION: Overall cognitive status: Impaired and Difficulty to assess due to: severity of deficits; Poor attention, fluency, orientation, and executive function. Recall is fairly good as well as abstraction.  Seems to have personality changes due to vascular dementia- like an OCD type volitional perseveration of tapping with Rt hand that he can somewhat control.  He did become angry and agitated at the end of the session performing car transfer with spouse: he did shout at her.  VISION: Subjective report: no new changes per pt/spouse, uses "cheaters"   VISION ASSESSMENT: Visual fields seem intact though he had latent responses on the left side showing likely inattention/and/neglect, tracking somewhat delayed horizontally and vertically  PERCEPTION: Impaired: Inattention/neglect: does not attend to left visual field and does not attend to left side of body, Body scheme: poor quality of recognition with shaving, etc., and Spatial orientation: decreased in print and in orientation, especially to Lt side  PRAXIS: Not tested specifically but no overt/stated issues using pen, wearing glasses ,etc.   OBSERVATIONS: Pt taps and flicks and does seem to volitionally perseverate on making a rhythm/noise with Rt hand. He states he is aware of it and chooses to do it.  He does also seem to have mild resting and intention tremor.    TODAY'S TREATMENT:                                                                                                                              DATE:  07/07/22: Due  to complexity of evaluation only the following recommendations could be made today as functional activity/self-care treatment: OT educates spouse on providing the patient with bimanual activities to do at home to increase spontaneous use of left arm which she demonstrates doing fairly well today opening a bottle of water.  To help decrease this new, ritualistic "tic" perseveration and right hand tapping she he was advised to provide an incongruent activity/alternate activities that would give his focus and attention towards the use of his hands in that way.  She states understanding and will try these things.  PATIENT EDUCATION: Education details: See treatment section above for details Person educated: Patient and Spouse Education method: Explanation, Demonstration, Tactile cues, and Verbal cues Education comprehension: verbalized understanding, returned demonstration, verbal cues required, tactile cues required, and needs further education  HOME EXERCISE PROGRAM: None given out yet.  Also this may not be appropriate for his cognition.  He would need a caregiver to help him accomplish it, most likely.   GOALS: Goals reviewed with patient? Yes   SHORT TERM GOALS: (STG required if POC>30 days) Target Date: 07/23/22   Pt will demo/state understanding of initial HEP (or home activity suggestions to maintain function as appropriate) to improve functional skills. Goal status: INITIAL   LONG TERM GOALS: Target Date: 08/20/22  Pt will improve functional ability by decreased impairment per Redmond Baseman assessment from 1/6 to 2/6 or better, for better quality of life. Goal status: INITIAL  2.  Caregiver will state his grooming ability/hygiene ability improved from moderate assist to supervision assist. Goal status: INITIAL  3.  He will show better functional use of left arm by improved box and blocks Test score from 5 blocks to at least 10 blocks. Goal status: INITIAL  4.  Pt  will show ability to microwave instant coffee or small meal safely, to help decrease caregiver burden. Goal status: INITIAL  5.  He will show improved cognitive ability enough to play a simple game with OT and caregiver. Goal status: INITIAL  6.  Caregiver will state at least 4 ways to decrease perseveration with right arm, also stating that this behaviors at least somewhat better in the home now. Goal status: INITIAL   ASSESSMENT:  CLINICAL IMPRESSION: Patient is a 82 y.o. male who was seen today for occupational therapy evaluation for multiple and complex problems including vascular dementia, new ritualistic behaviors, decreased functional ability, left hemiparesis and worsening left arm function, and more.  He will benefit from occupational therapy to increase functional ability and quality of life, also to help decrease caregiver burden somewhat.[name]   PERFORMANCE DEFICITS: in functional skills including ADLs, coordination, proprioception, ROM, strength, flexibility, Fine motor control, Gross motor control, mobility, balance, body mechanics, endurance, continence, decreased knowledge of precautions, decreased knowledge of use of DME, and UE functional use, cognitive skills including attention, learn, memory, orientation, perception, problem solving, safety awareness, temperament/personality, thought, and understand, and psychosocial skills including coping strategies, environmental adaptation, habits, and routines and behaviors.   IMPAIRMENTS: are limiting patient from ADLs, leisure, and social participation.   CO-MORBIDITIES: has co-morbidities such as OA, basal cell carcinoma, CKD, major cognitive d/o, incontinence, b/l sh stiffness and pain, LBP, and many others  that significantly affects occupational performance. Patient will benefit from skilled OT to address above impairments and improve overall function.  MODIFICATION OR ASSISTANCE TO COMPLETE EVALUATION: Maximum or significant  modification of tasks or assist is necessary to complete an evaluation.  OT OCCUPATIONAL PROFILE AND HISTORY: Comprehensive assessment: Review  of records and extensive additional review of physical, cognitive, psychosocial history related to current functional performance.  CLINICAL DECISION MAKING: High - multiple treatment options, significant modification of task necessary  REHAB POTENTIAL: Fair due to multiple, chronic and degenerative progressive nature of problems.  EVALUATION COMPLEXITY: High    PLAN:  OT FREQUENCY: 2x/week  OT DURATION: 6 weeks  PLANNED INTERVENTIONS: self care/ADL training, therapeutic exercise, therapeutic activity, neuromuscular re-education, manual therapy, balance training, functional mobility training, moist heat, cryotherapy, patient/family education, cognitive remediation/compensation, visual/perceptual remediation/compensation, psychosocial skills training, energy conservation, coping strategies training, DME and/or AE instructions, and Re-evaluation  RECOMMENDED OTHER SERVICES: He has already seen PT for balance and functional mobility, he would probably benefit from speech for cognitive issues as well, though OT will be working with cognition somewhat functionally now.  CONSULTED AND AGREED WITH PLAN OF CARE: Patient and family member/caregiver  PLAN FOR NEXT SESSION:  Review and retrain on initial recommendations for replacement behaviors for his "tics", review and perform functional simple tasks for bimanual use of upper extremities.  Work on cognitive ability and perceptual ability through other functional tasks continue assessments as required as well.  Work toward goals   [employer], OTR/L, CHT 07/07/2022, 6:07 PM

## 2022-07-05 NOTE — Progress Notes (Signed)
NEUROLOGY FOLLOW UP OFFICE NOTE  William [name] [id number]  Assessment/Plan:     1.   Major vascular neurocognitive disorder 2.   Parkinsonism - initially unclear if secondary vascular or medication-related.  Now concern for underlying idiopathic Parkinson's disease vas Parkinson-Plus syndrome (such as PSP). 3.  intracranial stenosis 4.  Hypertension 5.  Hyperlipidemia 6.  History of right CEA 7.  History of stroke   1.  To help establish diagnosis of idiopathic or secondary parkinsonism, will get DaT scan.  Wife would like to avoid punch skin biopsy which would require stopping anti-thrombotic therapy. 2.  Secondary stroke prevention -  Continue ASA and Plavix for remainder of 3 months, then Plavix '75mg'$  daily alone - Blood pressure control - Glycemic control.  Hgb A1c goal less than 7 -  LDL goal less than 70.  Unable to tolerate statin.   3. Follow up 4 months.  Total time spent reviewing chart and face-to-face with patient and his wife discussing diagnosis and plan:  55 minutes.   Subjective:  William [name] is an 82 year old man with dementia, CKD, HTN, CAD, HLD and history of CVA who follows up for vascular dementia, cerebrovascular disease with history of stroke and parkinsonism.  UPDATE: Current medications:  ASA '81mg'$ , Plavix '75mg'$ ; Norvasc, hydralazine, fenofibrate '160mg'$ , Tegretol-XR '100mg'$  at bedtime; amantadine '100mg'$  daily, lorazepam PRN; tizanidine '4mg'$  Q6h PRN, trazodone '75mg'$  QHS  Since last visit, he was hospitalized in September for ileus following lap cholecystectomy.  Required going to rehab for 2 months.  He typically has a bowel movement every morning.  He hasn't had a BM since Saturday.  Still has flatulence.  He had another posterior headache, which has since resolved.  Also his back pain has flared up again.  Prescribed tizanidine.  He is shaking more frequently.  He is more tired and sleeping often.  Sunday night he was unable to walk and needed assistance  getting to bed.  Balance seems to be worse and now having increased trouble ambulating.  Memory is worse.   HISTORY: 1.  Lumbar spondylosis with radiculopathy Prior to COVID, he was going to the gym and seeing a personal trainer but it stopped once the pandemic hit.  Since the pandemic, he has been sedentary.  He walks the dog once or twice a week.  He started having bilateral leg pain later that year in 2020.  No associated back pain.  No shooting pain.  It is described as an aching in his calves.  No associated numbness.  He also reports weakness.  When he walks, he has trouble picking up his legs.  He seems to shuffle his feet.  When he gets up to stand, he feels like he can fall forward.  He had one fall while standing at the toilet and he bent forward to spit in toilet.  No change in bowel habit.  He was having urinary issues so he was taken off of Flomax.    Vascular US ABI from 12/08/18 showed abnormal toe-brachial index in both lower extremities but otherwise unremarkable.  MRI of cervical spine without contrast from 03/10/2019 showed some cervical degenerative disc disease and spondylosis with mild foraminal narrowing but no spinal stenosis or compressive myelopathy.  He does have history of low back pain and lumbar spondylosis.  Due to ongoing unsteady gait with bilateral leg pain, MRI of lumbar spine was performed on 11/14/2019, which was personally reviewed and demonstrated degenerative changes stable compared to 2012 with  L3-4 right subarticular stenosis.  He has had mixed response to epidural injections.     2.  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  He has some residual left sided weakness and facial weakness.  He underwent left carotid endarterectomy in 2010 for asymptomatic stenosis.   Beginning  around 2013, he and his wife have noted a gradual progression and cognitive deficits. Onset correlated during a period when he was experiencing dizziness and syncope secondary to orthostatic hypotension. At that time, he was dehydrated and not drinking much fluids. He was instructed to start increasing his fluid intake as well as his sodium intake. Now, he tries to drink 3 bottles of water per day. He particularly notes confusion regarding how to perform certain everyday tasks. For example, it takes him a long time to unload the dishwasher because he has trouble figuring out where to put the dishes. He use to enjoy cooking and preparing meals. Now he has difficulty cooking and can only see things up in the microwave. He also has been experiencing dressing apraxia. Sometimes he will put both his feet into one leg of his pants. Other times, he has difficulty buttoning his shirts. He denies any language dysfunction such as difficulty understanding other people or other people not understanding him. He has no trouble reading or writing. His short-term and long-term memory are pretty good. He denies any difficulty with face recognition. He denies hallucinations or delusions. He has not had any change in his personality or behavior. He still drives but very seldom and only locally. He has not had any problems with accidents or near accidents, but he says he drives slowly because he is very nervous and cautious. He does have history of anxiety and often shakes when he gets nervous. He denies any family history of dementia. However, there is a psychiatric history with his mother and sister.  He denies history of alcohol abuse or drug use.   He underwent neuropsychological testing on 10/25/13 with Dr. Macarthur Critchley at [facility] suggested mild dementia due to his stroke, but also underlying neurodegenerative process.  Memory and left hemispheric medial temporal functions were intact.  Findings showed deficits in  visual processing and executive dysfunction.  Onset of visual-spatial deficits and apraxia may suggest occipital lobe involvement, such as due to an Alzheimer's variant called posterior cortical atrophy.  MRI from May 2015 revealed global atrophy but it does not seem more prominent in the occipital lobes.  He developed increased confusion in February 2021.  At physical therapy, he was noted to have increased difficulty using his left side (affected side from his stroke).  He had difficulty performing finger-thumb tapping and had trouble gripping the handle of the exercise machine with his left hand.  He also has had acute cognitive changes.  He started having trouble dialing his daughter's phone number which is new.  MRI of brain without contrast performed on 08/15/2019 was personally reviewed and showed remote large right hemispheric stroke and chronic small vessel ischemic changes, stable compared to prior MRI from 2015, no acute intracranial abnormality.   He underwent repeat neuropsychological testing on 11/29/2019 which demonstrated findings primarily consistent with major vascular neurocognitive disorder, relatively stable compared to prior testing in 2015.  Underlying neurodegenerative disorder could not be ruled out but clinically has been  stable.    In February 2023, he became more confused.  Found to have UTI as well as acute infarct in left basal ganglia and corona radiata as well as severe distal left M1 MCA stenosis and moderate right ICA stenosis.  Discharged on ASA and Plavix for 3 months followed by Plavix monotherapy.  Following discharge, continued to be confused.  Mistakes wife with his ex-wife.  Incontinent.  Baseline hemineglect worse.     Prior medication:  Aricept (diarrhea, vivid dreams); galantamine (nausea)   3.  Headache: In February 2022, he reported new moderate non-throbbing headache back of head every night.  Ongoing for two months.  No neck pain.  It occurs in the evening while  sitting talking or watching TV.  Treats with Tylenol almost everyday.  Lasts a half hour.  No change in medication just prior to onset.  Similar headache when he had his stroke.  MRI of brain without contrast on 08/14/2020 showed no new acute/subacute intracranial abnormality.  Headaches resolved.     01/30/20 Carotid doppler:  1-39% bilateral ICA stenosis. 10/16/13 LABS:  B12 720, TSH 1.668 10/19/13 MRI BRAIN WO:  stable remote large right MCA territory infarct.  Chronic microvascular ischemic changes. 05/21/13 LABS:  Hgb A1c 6 05/17/13 Carotid doppler: bilateral 1-39% stenosis of the right proximal ICA and no stenosis of the left ICA. 03/11/12 MRI Brain wo contrast:  Remote large right MCA infarct. Dementia with behavioral symptoms.  Etiology unclear.  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   HTN (hypertension) 07/19/2015   Hyperlipidemia    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   Mild, related to stroke history   Nocturia    Renal insufficiency 06/25/2013   S/P carotid endarterectomy    BILATERAL ICA--  PATENT PER DUPLEX  05-19-2012   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     amantadine (SYMMETREL) 100 MG capsule Take 1 capsule (100 mg total) by mouth daily. (Patient taking differently: Take 100 mg by mouth daily. *Crushed*) 90 capsule 1   amLODipine (NORVASC) 10 MG tablet Take 1 tablet (10 mg total) by mouth every morning. (Patient taking differently: Take 10 mg by mouth every morning. *Crushed*) 90 tablet 0   carBAMazepine (TEGRETOL) 100 MG/5ML suspension Take 5 mLs (100 mg total) by mouth in the morning and in the afternoon, and then 10 mLs (200 mg total) at bedtime. 600 mL 3   Cholecalciferol (VITAMIN D3) 25 MCG (1000 UT) CHEW Chew 1 tablet by mouth daily.     clopidogrel (PLAVIX) 75 MG  tablet Take 1 tablet (75 mg total) by mouth daily. (Patient taking differently: Take 75 mg by mouth daily. *Crushed*) 90 tablet 1   clotrimazole-betamethasone (LOTRISONE) cream Apply 1 Application on to the skin daily. 30 g 0   ferrous sulfate 325 (65 FE) MG EC tablet Take 325 mg by mouth daily with breakfast. *Crushed*     fluticasone (CUTIVATE) 5.97 % cream 1 application  daily as needed (psoriasis).  3   hydrALAZINE (APRESOLINE) 25 MG tablet Take 1 tablet (25 mg total) by mouth every 8 (eight) hours. (Patient taking differently: Take 25 mg by mouth every 8 (eight) hours. *Crushed*) 270 tablet 1   hydrOXYzine (VISTARIL) 25 MG capsule Take 1 capsule by mouth everyday at 3:30 PM as needed 60 capsule 2   LORazepam (ATIVAN) 0.5 MG tablet Take 0.5 tablets (0.25 mg total) by mouth daily. *Crushed* 10 tablet 0   LORazepam (ATIVAN) 0.5 MG tablet Take 1/2 tablet (0.25 mg total) by mouth in the morning and take 1 tablet (0.5 mg total) in the afternoon at 4pm. 50 tablet 4   LORazepam ER (LOREEV XR) 1 MG CS24 Take 1 mg by mouth at bedtime. *Crushed* (Patient taking differently: Take 1 mg by mouth at bedtime. *Crushed*) 20 capsule 0   LORazepam ER (LOREEV XR) 1 MG CS24 Take 1 capsule (1 mg total) by mouth at bedtime as  directed. 30 capsule 4   LORazepam ER (LOREEV XR) 1 MG CS24 Take 1 mg by mouth at bedtime. *Crushed* 30 capsule 3   Multiple Vitamins-Minerals (ADULT ONE DAILY GUMMIES) CHEW Chew 1 tablet by mouth every morning.     pantoprazole (PROTONIX) 40 MG tablet Take 1 tablet (40 mg total) by mouth daily. (Patient taking differently: Take 40 mg by mouth daily. *Crushed*) 90 tablet 3   polyethylene glycol (MIRALAX / GLYCOLAX) 17 g packet Take 17 g by mouth daily. 14 each 0   tamsulosin (FLOMAX) 0.4 MG CAPS capsule Take 1 capsule (0.4 mg total) by mouth daily after supper. (Patient taking differently: Take 0.4 mg by mouth daily after supper. *Crushed*) 90 capsule 1   tiZANidine (ZANAFLEX) 4 MG tablet Take 1 tablet (4 mg total) by mouth every 6 (six) hours as needed for muscle spasms. 30 tablet 0   traZODone (DESYREL) 150 MG tablet Take 0.5 tablets (75 mg total) by mouth at bedtime. *Crushed* 20 tablet 0   triamcinolone cream (KENALOG) 0.1 % Apply 1 application topically 2 (two) times daily. (Patient taking differently: Apply 1 Application topically 2 (two) times daily as needed (rash).) 30 g 0   Current Facility-Administered Medications on File Prior to Visit  Medication Dose Route Frequency Provider Last Rate Last Admin   gemcitabine (GEMZAR) chemo syringe for bladder instillation 2,000 mg  2,000 mg Bladder Instillation Once Franchot Gallo, MD        ALLERGIES: Allergies  Allergen Reactions   Bee Venom Anaphylaxis   Strawberry Extract Hives   Latex Itching   Zetia [Ezetimibe] Other (See Comments)    Intolerance    Adhesive [Tape] Other (See Comments)    blisters   Statins Other (See Comments)    myalgias    FAMILY HISTORY: Family History  Problem Relation Age of Onset   Heart disease Mother        CHF   Bipolar disorder Mother    Heart disease Father        CHF      Objective:  Blood pressure 132/63, pulse 73, height '5\' 11"'$  (  1.803 m), weight 180 lb (81.6 kg), SpO2 98 %. General:  No acute distress.  Patient appears frail  Head:  Normocephalic/atraumatic Eyes:  Fundi examined but not visualized Neck: supple, no paraspinal tenderness, full range of motion Heart:  Regular rate and rhythm Neurological Exam:  alert and oriented to person, month and day.  He said location was [place] and year was 14.  Speech fluent and not dysarthric, language intact.  Left sided neglect.  Hypomimia.  Upward gaze absent.  Limited tracking.  Left lower homonymous quadranopsia.  Otherwise, CN II-XII intact. Increased tone, particularly left upper extremity., muscle strength 3/5 left upper extremity, otherwise 4/5 throughout.  Resting tremor in hands..  Sensation to light touch intact.  Deep tendon reflexes 3+ throughout, Hoffman sign absent, Babinski sign absent.  Finger to nose testing intact.  Requires assistance to stand.  Difficulty initiating step and then shuffling.  Unsteady.     Metta Clines, DO  CC: Roma Schanz, DO

## 2022-07-06 ENCOUNTER — Encounter: Payer: Self-pay | Admitting: Neurology

## 2022-07-06 ENCOUNTER — Encounter: Payer: Self-pay | Admitting: Family Medicine

## 2022-07-06 ENCOUNTER — Ambulatory Visit (INDEPENDENT_AMBULATORY_CARE_PROVIDER_SITE_OTHER): Payer: PPO | Admitting: Neurology

## 2022-07-06 ENCOUNTER — Telehealth: Payer: Self-pay | Admitting: Family Medicine

## 2022-07-06 VITALS — BP 132/63 | HR 73 | Ht 71.0 in | Wt 180.0 lb

## 2022-07-06 DIAGNOSIS — Z8673 Personal history of transient ischemic attack (TIA), and cerebral infarction without residual deficits: Secondary | ICD-10-CM | POA: Diagnosis not present

## 2022-07-06 DIAGNOSIS — F0390 Unspecified dementia without behavioral disturbance: Secondary | ICD-10-CM

## 2022-07-06 DIAGNOSIS — R292 Abnormal reflex: Secondary | ICD-10-CM | POA: Diagnosis not present

## 2022-07-06 DIAGNOSIS — G20C Parkinsonism, unspecified: Secondary | ICD-10-CM

## 2022-07-06 DIAGNOSIS — I639 Cerebral infarction, unspecified: Secondary | ICD-10-CM | POA: Diagnosis not present

## 2022-07-06 NOTE — Telephone Encounter (Signed)
Patient's wife called wanting to get advice regarding her husband's bowel movements. She stated that he has not had a bowel movement since Saturday morning and she is concerned. Please advise.

## 2022-07-06 NOTE — Patient Instructions (Signed)
Will try to get an DaT scan of head to evaluate for parkinson's Will check MRI of cervical spine to evaluate for pinching of spinal cord that would contribute to balance problems/weaknes Consider starting carbidopa-levodopa to help improve movement Follow up after testing.

## 2022-07-06 NOTE — Telephone Encounter (Signed)
See my chart message

## 2022-07-07 ENCOUNTER — Encounter: Payer: Self-pay | Admitting: Rehabilitative and Restorative Service Providers"

## 2022-07-07 ENCOUNTER — Other Ambulatory Visit: Payer: Self-pay

## 2022-07-07 ENCOUNTER — Ambulatory Visit: Payer: PPO | Attending: Family Medicine | Admitting: Physical Therapy

## 2022-07-07 ENCOUNTER — Ambulatory Visit: Payer: PPO | Admitting: Rehabilitative and Restorative Service Providers"

## 2022-07-07 DIAGNOSIS — R262 Difficulty in walking, not elsewhere classified: Secondary | ICD-10-CM

## 2022-07-07 DIAGNOSIS — I69318 Other symptoms and signs involving cognitive functions following cerebral infarction: Secondary | ICD-10-CM

## 2022-07-07 DIAGNOSIS — I69354 Hemiplegia and hemiparesis following cerebral infarction affecting left non-dominant side: Secondary | ICD-10-CM

## 2022-07-07 DIAGNOSIS — R278 Other lack of coordination: Secondary | ICD-10-CM

## 2022-07-07 DIAGNOSIS — M6281 Muscle weakness (generalized): Secondary | ICD-10-CM

## 2022-07-07 DIAGNOSIS — I639 Cerebral infarction, unspecified: Secondary | ICD-10-CM | POA: Diagnosis not present

## 2022-07-07 DIAGNOSIS — R41840 Attention and concentration deficit: Secondary | ICD-10-CM

## 2022-07-07 DIAGNOSIS — R41841 Cognitive communication deficit: Secondary | ICD-10-CM

## 2022-07-07 DIAGNOSIS — Z9181 History of falling: Secondary | ICD-10-CM | POA: Diagnosis not present

## 2022-07-07 DIAGNOSIS — W19XXXD Unspecified fall, subsequent encounter: Secondary | ICD-10-CM | POA: Insufficient documentation

## 2022-07-07 NOTE — Therapy (Signed)
OUTPATIENT PHYSICAL THERAPY NEURO EVALUATION   Patient Name: William [name] MRN: [id number] DOB:[birth year], 82 y.o., male Today's Date: 07/08/2022   PCP: Lowne Chase, [place] PROVIDER: same  END OF SESSION:  PT End of Session - 07/07/22 1611     Visit Number 1             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   HTN (hypertension) 07/19/2015   Hyperlipidemia    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   Mild, related to stroke history   Nocturia    Renal insufficiency 06/25/2013   S/P carotid endarterectomy    BILATERAL ICA--  PATENT PER DUPLEX  05-19-2012   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   Right calf   Urinary frequency    Vitamin D deficiency    Past Surgical History:  Procedure Laterality Date   APPENDECTOMY  AS CHILD   CARDIOVASCULAR STRESS TEST  03-27-2012  DR CRENSHAW   LOW RISK LEXISCAN STUDY-- PROBABLE NORMAL PERFUSION AND SOFT TISSUE ATTENUATION/  NO ISCHEMIA/ EF 51%   CAROTID ENDARTERECTOMY Bilateral LEFT  11-12-2008  DR GREG HAYES   RIGHT ICA  2006  ([facility])   CHOLECYSTECTOMY N/A 02/23/2022   Procedure: LAPAROSCOPIC CHOLECYSTECTOMY;  Surgeon: Felicie Morn, MD;  Location: [facility];  Service: General;  Laterality: N/A;   CYSTOSCOPY W/ RETROGRADES Bilateral 06/22/2021   Procedure: CYSTOSCOPY WITH RETROGRADE PYELOGRAM;  Surgeon: Franchot Gallo, MD;  Location: [facility];  Service: Urology;  Laterality: Bilateral;   CYSTOSCOPY WITH LITHOLAPAXY N/A 02/26/2013   Procedure: CYSTOSCOPY WITH  LITHOLAPAXY;  Surgeon: Franchot Gallo, MD;  [facility];  Service: Urology;  Laterality: N/A;   ENDOSCOPIC RETROGRADE CHOLANGIOPANCREATOGRAPHY (ERCP) WITH PROPOFOL N/A 02/22/2022   Procedure: ENDOSCOPIC RETROGRADE CHOLANGIOPANCREATOGRAPHY (ERCP) WITH PROPOFOL;  Surgeon: Carol Ada, MD;  Location: [facility];  Service: Gastroenterology;  Laterality: N/A;   EYE SURGERY  Jan. 2016   cataract surgery both eyes   INGUINAL HERNIA REPAIR Right 11-08-2006   IR KYPHO EA ADDL LEVEL THORACIC OR LUMBAR  02/12/2021   IR RADIOLOGIST EVAL & MGMT  02/18/2021   MASS EXCISION N/A 03/03/2016   Procedure: EXCISION OF BACK  MASS;  Surgeon: Stark Klein, MD;  Location: [facility];  Service: General;  Laterality: N/A;   MOHS SURGERY Left 1/ 2016   Dr Nevada Crane-- Basal cell   PROSTATE SURGERY     REMOVAL OF STONES  02/22/2022   Procedure: REMOVAL OF STONES;  Surgeon: Carol Ada, MD;  Location[facility] ENDOSCOPY;  Service: Gastroenterology;;   [name]  02/22/2022   Procedure: [name];  Surgeon: Carol Ada, MD;  Location: [facility] ENDOSCOPY;  Service: Gastroenterology;;   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-C N/A 06/22/2021   Procedure: TRANSURETHRAL RESECTION OF BLADDER TUMOR;  Surgeon: Franchot Gallo, MD;  Location: [facility];  Service: Urology;  Laterality: N/A;   TRANSURETHRAL RESECTION OF PROSTATE N/A 02/26/2013   Procedure: TRANSURETHRAL RESECTION OF THE PROSTATE WITH  GYRUS INSTRUMENTS;  Surgeon: Franchot Gallo, MD;  Location: [facility];  Service: Urology;  Laterality: N/A;   TRANSURETHRAL RESECTION OF PROSTATE N/A 06/22/2021   Procedure: TRANSURETHRAL RESECTION OF THE PROSTATE (TURP);  Surgeon: Franchot Gallo, MD;  Location: [facility];  Service: Urology;  Laterality: N/A;   Patient Active Problem List   Diagnosis Date Noted   Nonintractable headache 07/01/2022   Bilateral impacted cerumen  06/24/2022   Rash 06/15/2022   Postoperative ileus ([place]) 02/27/2022   Ileus, postoperative ([place]) 02/26/2022   Choledocholithiasis 02/19/2022   DNR (do not resuscitate) 02/19/2022   Left elbow pain 01/26/2022   Mid back pain on left side 01/26/2022   Rib pain 01/26/2022   Acute pain of left shoulder 11/12/2021   Leukocytes in urine 11/12/2021   Urinary frequency 11/12/2021   Thrush 10/08/2021   Hemiplegia, dominant side S/P CVA (cerebrovascular accident) ([place]) 09/11/2021   Insomnia    Prediabetes    Acute 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 ([place])    Basal ganglia infarction ([place]) 07/29/2021   Transaminitis 07/27/2021   UTI (urinary tract infection) 07/27/2021   CVA (cerebral vascular accident) ([place]) 07/27/2021   Fall 07/27/2021   Hyperglycemia 07/27/2021   Cholelithiasis 07/27/2021   Hypoxia 07/27/2021   Nausea and vomiting 97/41/6384   Acute metabolic encephalopathy 53/64/6803   Normocytic anemia 07/27/2021   Chronic back pain 07/27/2021   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[place]) 06/22/2021   Closed fracture of first lumbar vertebra with routine healing 02/03/2021   Closed fracture of multiple ribs 11/18/2020   Anxiety 01/29/2020   Leg pain, bilateral 01/29/2020   Ingrown toenail 07/13/2019   Lumbar spondylosis 05/02/2018   Pain in left knee 03/09/2018   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   Trochanteric bursitis of left hip 01/16/2018   Preventative health care 09/26/2017   HTN (hypertension) 07/19/2015   Hyperlipidemia 07/19/2015   Great toe pain 02/11/2014   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   Obesity (BMI 30-39.9) 06/25/2013   Renal insufficiency 06/25/2013   Weakness of left arm 06/25/2013   Sebaceous cyst 03/03/2011   Sprain of lumbar region 07/31/2010   Rib pain, left 08/29/2009   Carotid artery stenosis, asymptomatic, bilateral 05/02/2009   Eczema, atopic 05/31/2008   Vitamin D deficiency  03/01/2008   BPH (benign prostatic hyperplasia) 08/06/2007   Fasting hyperglycemia 12/21/2006   History of right MCA infarct 2006    ONSET DATE: 06/29/22  REFERRING DIAG:  Diagnosis  I63.9 (ICD-10-CM) - Cerebrovascular accident (CVA), unspecified mechanism ([place])  W19.XXXD (ICD-10-CM) - Fall, subsequent encounter    THERAPY DIAG:  Other lack of coordination  Other symptoms and signs involving cognitive functions following cerebral infarction  Muscle weakness (generalized)  Difficulty in walking, not elsewhere classified  Hemiplegia and hemiparesis following cerebral infarction affecting left non-dominant side (HCC)  Rationale for Evaluation and Treatment: Rehabilitation  SUBJECTIVE:  SUBJECTIVE STATEMENT: Patient's wife reports he was hospitalized , with 2 surgeries for his gall bladder. His bowels shut down and he was re- hospitalized x 10 days. He went to rehab at [facility]. He tested (+) for Covid and was then in isolation for another period before he was able to resume activity. He went home around Thanksgiving and has been receiving HHPT since that time.   Pt accompanied by: significant other William [name]  PERTINENT HISTORY:  JARMAR ROUSSEAU is a 82 year old man with dementia, CKD, HTN, CAD, HLD and history of CVA  (2006, 2021), Parkinson's  PAIN:  Are you having pain? No  PRECAUTIONS: Fall  WEIGHT BEARING RESTRICTIONS: No  FALLS: Has patient fallen in last 6 months? No  LIVING ENVIRONMENT: Lives with: lives with their family and lives with their spouse Lives in: House/apartment Stairs:  1 brick high step Has following equipment at home: Environmental consultant - 2 wheeled, Wheelchair (manual), Shower bench, bed side commode, Grab bars, and hospital bed, sliding pad for car  PLOF: Independent with basic ADLs  and Independent with household mobility without device  PATIENT GOALS: Walk around the block with his wife. Be able to use the dining room chair rather than have to use the W/C. Increased I with bed mobility and simple tasks at home like make some coffee or brush his teeth, Step over tub to shower, has bench, but he can't really use it. Up and down the one step to get to back deck.  OBJECTIVE:   DIAGNOSTIC FINDINGS:  MRI 2021 with degenerative changes in lumbar spine, L3-4 R subarticular stenosis  COGNITION: Overall cognitive status: History of cognitive impairments - at baseline   SENSATION: Not tested  COORDINATION: Moderately impaired BLE   MUSCLE LENGTH: Hamstrings: severely restricted B Thomas test: Severely restricted B.  POSTURE: rounded shoulders, forward head, increased thoracic kyphosis, posterior pelvic tilt, and flexed trunk   LOWER EXTREMITY ROM:   BLE extremely stiff throughout, limited hip ROM in all planes, B knees limited in extension.   LOWER EXTREMITY MMT:  3+/5 throughout. Unable to determine accuracy of MMT due to cognition. Functionally demosntrates poor coordinated activation and poor muscular endurance.   BED MOBILITY: Min A for Supine<> Sit. Patient was using a ladder in his hospital bed and could move MI.  TRANSFERS: Min A, mod VC, tends to lean back.  CURB: Min A-Has one step out to his deck.  STAIRS: N/A   GAIT: Gait pattern: step to pattern, decreased arm swing- Right, decreased arm swing- Left, decreased step length- Right, decreased step length- Left, shuffling, festinating, trunk flexed, narrow BOS, poor foot clearance- Right, and poor foot clearance- Left Distance walked: 67' Assistive device utilized: Environmental consultant - 2 wheeled Level of assistance: Occasional min A Comments: mod VC to stay close to walker, take longer steps.  FUNCTIONAL TESTS:  5 times sit to stand: 38.23 Timed up and go (TUG): 47.91  with RW  TODAY'S TREATMENT:  DATE:  07/07/22  Education  PATIENT EDUCATION: Education details: POC Person educated: Patient and Spouse Education method: Explanation Education comprehension: verbalized understanding  HOME EXERCISE PROGRAM: TBD  GOALS: Goals reviewed with patient? Yes  SHORT TERM GOALS: Target date: 07/19/22  I with initial HEP Baseline: Goal status: INITIAL  2. Decrease 5 x STS to < 25 sec to demonstrate improved functional strength  Baseline: 38 sec  Goal status: INITIAL  3. Decrease TUG to < 35 sec to demonstrate improved functional balance  Baseline: 47 sec  Goal status: INITIAL  4. Perform sit <> stand transfers from average chair height with S and VC to be able to use the swivel chair at his dining table.  Baseline: Min A  Goal status: INITIAL  LONG TERM GOALS: Target date: 09/29/22  Patient will be able to step over his tub at home using grab bars for stability with CGA to access his shower. Baseline: Unable- has bench, but wife reports he steps over then sits. Goal status: INITIAL  2.  Decrease 5 x STS to < 18 sec to demonstrate improved functional strength Baseline:  Goal status: INITIAL  3.  Decrease TUG to < 25 sec to demonstrate improved functional balance Baseline:  Goal status: INITIAL  4.  Patient will perform his bed mobility with MI. Baseline: Min A Goal status: INITIAL  5.  Patient will ambulate at least 400' with LRAD, CGA, demonstrate improved upright posture, longer step length. Baseline:  Goal status: INITIAL  6.  Patient will manage up and down a single step with LRAD, CGA to access his deck at home. Baseline: Unable Goal status: INITIAL  ASSESSMENT:  CLINICAL IMPRESSION: Patient is a 82 y.o. who was seen today for physical therapy evaluation and treatment for weakness, balance issues. He has had several hospitalizations since his last  visit in PT. He demonstrates functional weakness, poor coordination, decreased balance, decreased safety awareness, decreased safety and I with all functional mobility. In addition to CVA x 2 in the past, he has dementia and Parkinson's traits. In spite of all this he was able to participate in the evaluation and expressed desire to improve his mobility. His wife is his primary caregiver. She accompanies him and is very knowledgeable of his deficits. He has responded well to therapy in the past and will benefit from PT to address his deficits and improve his safety and I with his mobility.  OBJECTIVE IMPAIRMENTS: Abnormal gait, decreased activity tolerance, decreased balance, decreased cognition, decreased coordination, decreased endurance, decreased mobility, difficulty walking, decreased ROM, decreased strength, decreased safety awareness, impaired flexibility, impaired UE functional use, improper body mechanics, and postural dysfunction.   ACTIVITY LIMITATIONS: carrying, lifting, bending, sitting, standing, squatting, stairs, transfers, bed mobility, bathing, toileting, dressing, hygiene/grooming, and locomotion level  PARTICIPATION LIMITATIONS: meal prep, cleaning, laundry, interpersonal relationship, shopping, and community activity  PERSONAL FACTORS: Age, Past/current experiences, and 3+ comorbidities: CVA, Parkinson's traits, dementia  are also affecting patient's functional outcome.   REHAB POTENTIAL: Good  CLINICAL DECISION MAKING: Evolving/moderate complexity  EVALUATION COMPLEXITY: Moderate  PLAN:  PT FREQUENCY: 2x/week  PT DURATION: 12 weeks  PLANNED INTERVENTIONS: Therapeutic exercises, Therapeutic activity, Neuromuscular re-education, Balance training, Gait training, Patient/Family education, Self Care, Joint mobilization, Stair training, Moist heat, and Manual therapy  PLAN FOR NEXT SESSION: Trunk and extremity mobilization, balance, strength, functional mobility  re-education.   Marcelina Morel, DPT 07/08/2022, 8:15 AM

## 2022-07-08 NOTE — Progress Notes (Signed)
Fax received from [facility] Required for [id number]. HQU#[id number].   Message sent to Barnetta Hammersmith at [facility] cone to schedule [employer].

## 2022-07-09 ENCOUNTER — Telehealth: Payer: Self-pay | Admitting: Neurology

## 2022-07-09 ENCOUNTER — Other Ambulatory Visit (HOSPITAL_BASED_OUTPATIENT_CLINIC_OR_DEPARTMENT_OTHER): Payer: Self-pay

## 2022-07-09 MED ORDER — LUBIPROSTONE 24 MCG PO CAPS
24.0000 ug | ORAL_CAPSULE | Freq: Every day | ORAL | 2 refills | Status: DC
  Filled 2022-07-09: qty 30, 30d supply, fill #0

## 2022-07-09 NOTE — Telephone Encounter (Signed)
error 

## 2022-07-12 ENCOUNTER — Other Ambulatory Visit (HOSPITAL_BASED_OUTPATIENT_CLINIC_OR_DEPARTMENT_OTHER): Payer: Self-pay

## 2022-07-12 ENCOUNTER — Other Ambulatory Visit: Payer: Self-pay | Admitting: Family Medicine

## 2022-07-12 DIAGNOSIS — N1832 Chronic kidney disease, stage 3b: Secondary | ICD-10-CM | POA: Diagnosis not present

## 2022-07-12 DIAGNOSIS — I10 Essential (primary) hypertension: Secondary | ICD-10-CM | POA: Diagnosis not present

## 2022-07-13 ENCOUNTER — Encounter: Payer: Self-pay | Admitting: Physical Therapy

## 2022-07-13 ENCOUNTER — Ambulatory Visit: Payer: PPO | Admitting: Physical Therapy

## 2022-07-13 ENCOUNTER — Encounter: Payer: Self-pay | Admitting: Family Medicine

## 2022-07-13 ENCOUNTER — Other Ambulatory Visit: Payer: Self-pay | Admitting: Family Medicine

## 2022-07-13 ENCOUNTER — Other Ambulatory Visit (HOSPITAL_BASED_OUTPATIENT_CLINIC_OR_DEPARTMENT_OTHER): Payer: Self-pay

## 2022-07-13 ENCOUNTER — Other Ambulatory Visit: Payer: Self-pay

## 2022-07-13 DIAGNOSIS — M6281 Muscle weakness (generalized): Secondary | ICD-10-CM

## 2022-07-13 DIAGNOSIS — R41840 Attention and concentration deficit: Secondary | ICD-10-CM

## 2022-07-13 DIAGNOSIS — Z9181 History of falling: Secondary | ICD-10-CM

## 2022-07-13 DIAGNOSIS — I639 Cerebral infarction, unspecified: Secondary | ICD-10-CM | POA: Diagnosis not present

## 2022-07-13 DIAGNOSIS — R051 Acute cough: Secondary | ICD-10-CM

## 2022-07-13 DIAGNOSIS — R262 Difficulty in walking, not elsewhere classified: Secondary | ICD-10-CM

## 2022-07-13 DIAGNOSIS — R278 Other lack of coordination: Secondary | ICD-10-CM

## 2022-07-13 DIAGNOSIS — I69354 Hemiplegia and hemiparesis following cerebral infarction affecting left non-dominant side: Secondary | ICD-10-CM

## 2022-07-13 MED ORDER — AMLODIPINE BESYLATE 10 MG PO TABS
10.0000 mg | ORAL_TABLET | Freq: Every morning | ORAL | 1 refills | Status: DC
  Filled 2022-07-13: qty 90, 90d supply, fill #0
  Filled 2022-10-17: qty 90, 90d supply, fill #1

## 2022-07-13 NOTE — Therapy (Signed)
OUTPATIENT PHYSICAL THERAPY NEURO EVALUATION   Patient Name: William [name] MRN: [id number] DOB:10-May-1941, 82 y.o., male Today's Date: 07/13/2022   PCP: Ann Held  DO REFERRING PROVIDER: same  END OF SESSION:  PT End of Session - 07/13/22 1237     Visit Number 2    Date for PT Re-Evaluation 09/29/22    PT Start Time 1234    PT Stop Time 1312    PT Time Calculation (min) 38 min    Equipment Utilized During Treatment Gait belt    Activity Tolerance Patient tolerated treatment well    Behavior During Therapy Restless;Impulsive              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   HTN (hypertension) 07/19/2015   Hyperlipidemia    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   Mild, related to stroke history   Nocturia    Renal insufficiency 06/25/2013   S/P carotid endarterectomy    BILATERAL ICA--  PATENT PER DUPLEX  05-19-2012   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   Right calf   Urinary frequency    Vitamin D deficiency    Past Surgical History:  Procedure Laterality Date   APPENDECTOMY  AS CHILD   CARDIOVASCULAR STRESS TEST  03-27-2012  DR CRENSHAW   LOW RISK LEXISCAN STUDY-- PROBABLE NORMAL PERFUSION AND SOFT TISSUE ATTENUATION/  NO ISCHEMIA/ EF 51%   CAROTID ENDARTERECTOMY Bilateral LEFT  11-12-2008  DR GREG HAYES   RIGHT ICA  2006  ([facility])   CHOLECYSTECTOMY N/A 02/23/2022   Procedure: LAPAROSCOPIC CHOLECYSTECTOMY;  Surgeon: Felicie Morn, MD;  Location: [place];  Service: General;  Laterality: N/A;   CYSTOSCOPY W/  RETROGRADES Bilateral 06/22/2021   Procedure: CYSTOSCOPY WITH RETROGRADE PYELOGRAM;  Surgeon: Franchot Gallo, MD;  Location: [place];  Service: Urology;  Laterality: Bilateral;   CYSTOSCOPY WITH LITHOLAPAXY N/A 02/26/2013   Procedure: CYSTOSCOPY WITH LITHOLAPAXY;  Surgeon: Franchot Gallo, MD;  Location: [facility];  Service: Urology;  Laterality: N/A;   ENDOSCOPIC RETROGRADE CHOLANGIOPANCREATOGRAPHY (ERCP) WITH PROPOFOL N/A 02/22/2022   Procedure: ENDOSCOPIC RETROGRADE CHOLANGIOPANCREATOGRAPHY (ERCP) WITH PROPOFOL;  Surgeon: Carol Ada, MD;  Location: [place];  Service: Gastroenterology;  Laterality: N/A;   EYE SURGERY  Jan. 2016   cataract surgery both eyes   INGUINAL HERNIA REPAIR Right 11-08-2006   IR KYPHO EA ADDL LEVEL THORACIC OR LUMBAR  02/12/2021   IR RADIOLOGIST EVAL & MGMT  02/18/2021   MASS EXCISION N/A 03/03/2016   Procedure: EXCISION OF BACK  MASS;  Surgeon: Stark Klein, MD;  Location: [place];  Service: General;  Laterality: N/A;   MOHS SURGERY Left 1/ 2016   Dr Nevada Crane-- Basal cell   PROSTATE SURGERY     REMOVAL OF STONES  02/22/2022   Procedure: REMOVAL OF STONES;  Surgeon: Carol Ada, MD;  Location[facility] ENDOSCOPY;  Service: Gastroenterology;;   [name]  02/22/2022   Procedure: [name];  Surgeon: Carol Ada, MD;  Location: [facility]  ENDOSCOPY;  Service: Gastroenterology;;   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-C N/A 06/22/2021   Procedure: TRANSURETHRAL RESECTION OF BLADDER TUMOR;  Surgeon: Franchot Gallo, MD;  Location: [facility];  Service: Urology;  Laterality: N/A;   TRANSURETHRAL RESECTION OF PROSTATE N/A 02/26/2013   Procedure: TRANSURETHRAL RESECTION OF THE PROSTATE WITH GYRUS INSTRUMENTS;  Surgeon: Franchot Gallo, MD;  Location: [facility];  Service: Urology;  Laterality: N/A;   TRANSURETHRAL RESECTION OF PROSTATE N/A 06/22/2021   Procedure:  TRANSURETHRAL RESECTION OF THE PROSTATE (TURP);  Surgeon: Franchot Gallo, MD;  Location: [facility];  Service: Urology;  Laterality: N/A;   Patient Active Problem List   Diagnosis Date Noted   Nonintractable headache 07/01/2022   Bilateral impacted cerumen 06/24/2022   Rash 06/15/2022   Postoperative ileus ([place]) 02/27/2022   Ileus, postoperative ([place]) 02/26/2022   Choledocholithiasis 02/19/2022   DNR (do not resuscitate) 02/19/2022   Left elbow pain 01/26/2022   Mid back pain on left side 01/26/2022   Rib pain 01/26/2022   Acute pain of left shoulder 11/12/2021   Leukocytes in urine 11/12/2021   Urinary frequency 11/12/2021   Thrush 10/08/2021   Hemiplegia, dominant side S/P CVA (cerebrovascular accident) ([place]) 09/11/2021   Insomnia    Prediabetes    Acute 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 ([place])    Basal ganglia infarction ([place]) 07/29/2021   Transaminitis 07/27/2021   UTI (urinary tract infection) 07/27/2021   CVA (cerebral vascular accident) ([place]) 07/27/2021   Fall 07/27/2021   Hyperglycemia 07/27/2021   Cholelithiasis 07/27/2021   Hypoxia 07/27/2021   Nausea and vomiting 31/49/7026   Acute metabolic encephalopathy 37/85/8850   Normocytic anemia 07/27/2021   Chronic back pain 07/27/2021   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[place]) 06/22/2021   Closed fracture of first lumbar vertebra with routine healing 02/03/2021   Closed fracture of multiple ribs 11/18/2020   Anxiety 01/29/2020   Leg pain, bilateral 01/29/2020   Ingrown toenail 07/13/2019   Lumbar spondylosis 05/02/2018   Pain in left knee 03/09/2018   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   Trochanteric bursitis of left hip 01/16/2018   Preventative health care 09/26/2017   HTN (hypertension) 07/19/2015   Hyperlipidemia 07/19/2015   Great toe pain 02/11/2014   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   Obesity (BMI 30-39.9) 06/25/2013   Renal  insufficiency 06/25/2013   Weakness of left arm 06/25/2013   Sebaceous cyst 03/03/2011   Sprain of lumbar region 07/31/2010   Rib pain, left 08/29/2009   Carotid artery stenosis, asymptomatic, bilateral 05/02/2009   Eczema, atopic 05/31/2008   Vitamin D deficiency 03/01/2008   BPH (benign prostatic hyperplasia) 08/06/2007   Fasting hyperglycemia 12/21/2006   History of right MCA infarct 2006    ONSET DATE: 06/29/22  REFERRING DIAG:  Diagnosis  I63.9 (ICD-10-CM) - Cerebrovascular accident (CVA), unspecified mechanism ([place])  W19.XXXD (ICD-10-CM) - Fall, subsequent encounter    THERAPY DIAG:  Attention and concentration deficit  Other lack of coordination  Muscle weakness (generalized)  Hemiplegia and hemiparesis following cerebral infarction affecting left non-dominant side (HCC)  History of falling  Difficulty in walking, not elsewhere classified  Rationale for Evaluation and Treatment: Rehabilitation  SUBJECTIVE:  SUBJECTIVE STATEMENT: Patient's wife reports he was hospitalized , with 2 surgeries for his gall bladder. His bowels shut down and he was re- hospitalized x 10 days. He went to rehab at [place]. He tested (+) for Covid and was then in isolation for another period before he was able to resume activity. He went home around Thanksgiving and has been receiving HHPT since that time.   Pt accompanied by: significant other [name]  PERTINENT HISTORY:  JOWAN SKILLIN is a 82 year old man with dementia, CKD, HTN, CAD, HLD and history of CVA  (2006, 2021), Parkinson's  PAIN:  Are you having pain? No  PRECAUTIONS: Fall  WEIGHT BEARING RESTRICTIONS: No  FALLS: Has patient fallen in last 6 months? No  LIVING ENVIRONMENT: Lives with: lives with their family and lives with their  spouse Lives in: House/apartment Stairs:  1 brick high step Has following equipment at home: Environmental consultant - 2 wheeled, Wheelchair (manual), Shower bench, bed side commode, Grab bars, and hospital bed, sliding pad for car  PLOF: Independent with basic ADLs and Independent with household mobility without device  PATIENT GOALS: Walk around the block with his wife. Be able to use the dining room chair rather than have to use the W/C. Increased I with bed mobility and simple tasks at home like make some coffee or brush his teeth, Step over tub to shower, has bench, but he can't really use it. Up and down the one step to get to back deck.  OBJECTIVE:   DIAGNOSTIC FINDINGS:  MRI 2021 with degenerative changes in lumbar spine, L3-4 R subarticular stenosis  COGNITION: Overall cognitive status: History of cognitive impairments - at baseline   SENSATION: Not tested  COORDINATION: Moderately impaired BLE   MUSCLE LENGTH: Hamstrings: severely restricted B Thomas test: Severely restricted B.  POSTURE: rounded shoulders, forward head, increased thoracic kyphosis, posterior pelvic tilt, and flexed trunk   LOWER EXTREMITY ROM:   BLE extremely stiff throughout, limited hip ROM in all planes, B knees limited in extension.   LOWER EXTREMITY MMT:  3+/5 throughout. Unable to determine accuracy of MMT due to cognition. Functionally demosntrates poor coordinated activation and poor muscular endurance.   BED MOBILITY: Min A for Supine<> Sit. Patient was using a ladder in his hospital bed and could move MI.  TRANSFERS: Min A, mod VC, tends to lean back.  CURB: Min A-Has one step out to his deck.  STAIRS: N/A   GAIT: Gait pattern: step to pattern, decreased arm swing- Right, decreased arm swing- Left, decreased step length- Right, decreased step length- Left, shuffling, festinating, trunk flexed, narrow BOS, poor foot clearance- Right, and poor foot clearance- Left Distance walked: 38' Assistive device  utilized: Environmental consultant - 2 wheeled Level of assistance: Occasional min A Comments: mod VC to stay close to walker, take longer steps.  FUNCTIONAL TESTS:  5 times sit to stand: 38.23 Timed up and go (TUG): 47.91  with RW  TODAY'S TREATMENT:  DATE:  07/13/22 NuStep L5 x 5 minutes Seated forward weight shift, reaching 2# weight forward to touch RW, then moving sit to stand and reaching up. Required mod VC to lean forward and then to reach up with LUE. 8 reps. Standing lateral step out and back, required mod TC to weight shift onto LLE in abd, 10 each way Moved to quiet environment- Step back, rotate and reach behind to touch mat, 5 x on each side. Stood and reached up to above shoulder height to pick up object with L hand, walk back across 9' room to place on mat. Repeat x 4, using LUE to reach, CGA, no unsteadiness. Gait training with VC for longer step length, CGA, no AD, 2 x 100', including tihgt spaces, stepping back, turning.  07/07/22  Education  PATIENT EDUCATION: Education details: POC Person educated: Patient and Spouse Education method: Explanation Education comprehension: verbalized understanding  HOME EXERCISE PROGRAM: TBD  GOALS: Goals reviewed with patient? Yes  SHORT TERM GOALS: Target date: 07/19/22  I with initial HEP Baseline: Goal status: ongoing  2. Decrease 5 x STS to < 25 sec to demonstrate improved functional strength  Baseline: 38 sec  Goal status: INITIAL  3. Decrease TUG to < 35 sec to demonstrate improved functional balance  Baseline: 47 sec  Goal status: INITIAL  4. Perform sit <> stand transfers from average chair height with S and VC to be able to use the swivel chair at his dining table.  Baseline: Min A  Goal status: INITIAL  LONG TERM GOALS: Target date: 09/29/22  Patient will be able to step over his tub at home using grab  bars for stability with CGA to access his shower. Baseline: Unable- has bench, but wife reports he steps over then sits. Goal status: INITIAL  2.  Decrease 5 x STS to < 18 sec to demonstrate improved functional strength Baseline:  Goal status: INITIAL  3.  Decrease TUG to < 25 sec to demonstrate improved functional balance Baseline:  Goal status: ongoing  4.  Patient will perform his bed mobility with MI. Baseline: Min A Goal status: INITIAL  5.  Patient will ambulate at least 400' with LRAD, CGA, demonstrate improved upright posture, longer step length. Baseline:  Goal status: INITIAL  6.  Patient will manage up and down a single step with LRAD, CGA to access his deck at home. Baseline: Unable Goal status: INITIAL  ASSESSMENT:  CLINICAL IMPRESSION: Patient reports no new issues. Very distractible today, but participated well with mod VC to focus on the activity and for safety. Emphasized big moves, weight shifts, balance. Patient walked at the end of treatment without AD, improved posture, and balance.  OBJECTIVE IMPAIRMENTS: Abnormal gait, decreased activity tolerance, decreased balance, decreased cognition, decreased coordination, decreased endurance, decreased mobility, difficulty walking, decreased ROM, decreased strength, decreased safety awareness, impaired flexibility, impaired UE functional use, improper body mechanics, and postural dysfunction.   ACTIVITY LIMITATIONS: carrying, lifting, bending, sitting, standing, squatting, stairs, transfers, bed mobility, bathing, toileting, dressing, hygiene/grooming, and locomotion level  PARTICIPATION LIMITATIONS: meal prep, cleaning, laundry, interpersonal relationship, shopping, and community activity  PERSONAL FACTORS: Age, Past/current experiences, and 3+ comorbidities: CVA, Parkinson's traits, dementia  are also affecting patient's functional outcome.   REHAB POTENTIAL: Good  CLINICAL DECISION MAKING: Evolving/moderate  complexity  EVALUATION COMPLEXITY: Moderate  PLAN:  PT FREQUENCY: 2x/week  PT DURATION: 12 weeks  PLANNED INTERVENTIONS: Therapeutic exercises, Therapeutic activity, Neuromuscular re-education, Balance training, Gait training, Patient/Family education, Self Care, Joint mobilization,  Stair training, Moist heat, and Manual therapy  PLAN FOR NEXT SESSION: Trunk and extremity mobilization, balance, strength, functional mobility re-education.   Marcelina Morel, DPT 07/13/2022, 1:15 PM

## 2022-07-14 ENCOUNTER — Ambulatory Visit: Payer: PPO | Admitting: Neurology

## 2022-07-14 ENCOUNTER — Other Ambulatory Visit (HOSPITAL_BASED_OUTPATIENT_CLINIC_OR_DEPARTMENT_OTHER): Payer: Self-pay

## 2022-07-14 DIAGNOSIS — I10 Essential (primary) hypertension: Secondary | ICD-10-CM | POA: Diagnosis not present

## 2022-07-14 DIAGNOSIS — I679 Cerebrovascular disease, unspecified: Secondary | ICD-10-CM | POA: Diagnosis not present

## 2022-07-15 ENCOUNTER — Ambulatory Visit: Payer: PPO | Attending: Family Medicine

## 2022-07-15 ENCOUNTER — Other Ambulatory Visit: Payer: Self-pay

## 2022-07-15 ENCOUNTER — Ambulatory Visit (HOSPITAL_BASED_OUTPATIENT_CLINIC_OR_DEPARTMENT_OTHER)
Admission: RE | Admit: 2022-07-15 | Discharge: 2022-07-15 | Disposition: A | Discharge status: Home / Self Care | Payer: PPO | Source: Ambulatory Visit | Attending: Family Medicine | Admitting: Family Medicine

## 2022-07-15 DIAGNOSIS — R2689 Other abnormalities of gait and mobility: Secondary | ICD-10-CM | POA: Diagnosis not present

## 2022-07-15 DIAGNOSIS — I69354 Hemiplegia and hemiparesis following cerebral infarction affecting left non-dominant side: Secondary | ICD-10-CM | POA: Insufficient documentation

## 2022-07-15 DIAGNOSIS — R296 Repeated falls: Secondary | ICD-10-CM | POA: Diagnosis not present

## 2022-07-15 DIAGNOSIS — R278 Other lack of coordination: Secondary | ICD-10-CM | POA: Diagnosis not present

## 2022-07-15 DIAGNOSIS — Z9181 History of falling: Secondary | ICD-10-CM | POA: Insufficient documentation

## 2022-07-15 DIAGNOSIS — I69318 Other symptoms and signs involving cognitive functions following cerebral infarction: Secondary | ICD-10-CM | POA: Insufficient documentation

## 2022-07-15 DIAGNOSIS — M25512 Pain in left shoulder: Secondary | ICD-10-CM | POA: Insufficient documentation

## 2022-07-15 DIAGNOSIS — R262 Difficulty in walking, not elsewhere classified: Secondary | ICD-10-CM | POA: Diagnosis not present

## 2022-07-15 DIAGNOSIS — R059 Cough, unspecified: Secondary | ICD-10-CM | POA: Diagnosis not present

## 2022-07-15 DIAGNOSIS — M6281 Muscle weakness (generalized): Secondary | ICD-10-CM | POA: Diagnosis not present

## 2022-07-15 DIAGNOSIS — R41840 Attention and concentration deficit: Secondary | ICD-10-CM | POA: Insufficient documentation

## 2022-07-15 DIAGNOSIS — R051 Acute cough: Secondary | ICD-10-CM

## 2022-07-15 NOTE — Therapy (Signed)
OUTPATIENT PHYSICAL THERAPY NEURO EVALUATION   Patient Name: William [name] MRN: [id number] DOB:1940/07/07, 82 y.o., male Today's Date: 07/15/2022   PCP: Ann Held  DO REFERRING PROVIDER: same  END OF SESSION:  PT End of Session - 07/15/22 1053     Visit Number 3 (P)     Date for PT Re-Evaluation 09/29/22 (P)     PT Start Time 1055 (P)     PT Stop Time 1135 (P)     PT Time Calculation (min) 40 min (P)     Equipment Utilized During Treatment Gait belt (P)     Activity Tolerance Patient tolerated treatment well (P)     Behavior During Therapy Restless;Impulsive (P)                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   HTN (hypertension) 07/19/2015   Hyperlipidemia    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   Mild, related to stroke history   Nocturia    Renal insufficiency 06/25/2013   S/P carotid endarterectomy    BILATERAL ICA--  PATENT PER DUPLEX  05-19-2012   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   Right calf   Urinary frequency    Vitamin D deficiency    Past Surgical History:  Procedure Laterality Date   APPENDECTOMY  AS CHILD   CARDIOVASCULAR STRESS TEST  03-27-2012  DR CRENSHAW   LOW RISK LEXISCAN STUDY-- PROBABLE NORMAL PERFUSION AND SOFT TISSUE ATTENUATION/  NO ISCHEMIA/ EF 51%   CAROTID ENDARTERECTOMY Bilateral LEFT  11-12-2008  DR GREG HAYES   RIGHT ICA  2006  ([facility])   CHOLECYSTECTOMY N/A 02/23/2022   Procedure: LAPAROSCOPIC CHOLECYSTECTOMY;  Surgeon: Felicie Morn, MD;  Location: [place];  Service: General;   Laterality: N/A;   CYSTOSCOPY W/ RETROGRADES Bilateral 06/22/2021   Procedure: CYSTOSCOPY WITH RETROGRADE PYELOGRAM;  Surgeon: Franchot Gallo, MD;  Location: [place];  Service: Urology;  Laterality: Bilateral;   CYSTOSCOPY WITH LITHOLAPAXY N/A 02/26/2013   Procedure: CYSTOSCOPY WITH LITHOLAPAXY;  Surgeon: Franchot Gallo, MD;  Location: [facility];  Service: Urology;  Laterality: N/A;   ENDOSCOPIC RETROGRADE CHOLANGIOPANCREATOGRAPHY (ERCP) WITH PROPOFOL N/A 02/22/2022   Procedure: ENDOSCOPIC RETROGRADE CHOLANGIOPANCREATOGRAPHY (ERCP) WITH PROPOFOL;  Surgeon: Carol Ada, MD;  Location: [place];  Service: Gastroenterology;  Laterality: N/A;   EYE SURGERY  Jan. 2016   cataract surgery both eyes   INGUINAL HERNIA REPAIR Right 11-08-2006   IR KYPHO EA ADDL LEVEL THORACIC OR LUMBAR  02/12/2021   IR RADIOLOGIST EVAL & MGMT  02/18/2021   MASS EXCISION N/A 03/03/2016   Procedure: EXCISION OF BACK  MASS;  Surgeon: Stark Klein, MD;  Location: [place];  Service: General;  Laterality: N/A;   MOHS SURGERY Left 1/ 2016   Dr Nevada Crane-- Basal cell   PROSTATE SURGERY     REMOVAL OF STONES  02/22/2022   Procedure: REMOVAL OF STONES;  Surgeon: Carol Ada, MD;  Location: [place];  Service: Gastroenterology;;  SPHINCTEROTOMY  02/22/2022   Procedure: SPHINCTEROTOMY;  Surgeon: Carol Ada, MD;  Location: [facility] ENDOSCOPY;  Service: Gastroenterology;;   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-C N/A 06/22/2021   Procedure: TRANSURETHRAL RESECTION OF BLADDER TUMOR;  Surgeon: Franchot Gallo, MD;  Location: [facility];  Service: Urology;  Laterality: N/A;   TRANSURETHRAL RESECTION OF PROSTATE N/A 02/26/2013   Procedure: TRANSURETHRAL RESECTION OF THE PROSTATE WITH GYRUS INSTRUMENTS;  Surgeon: Franchot Gallo, MD;  Location: [facility];  Service: Urology;  Laterality: N/A;   TRANSURETHRAL RESECTION OF PROSTATE  N/A 06/22/2021   Procedure: TRANSURETHRAL RESECTION OF THE PROSTATE (TURP);  Surgeon: Franchot Gallo, MD;  Location: [facility];  Service: Urology;  Laterality: N/A;   Patient Active Problem List   Diagnosis Date Noted   Nonintractable headache 07/01/2022   Bilateral impacted cerumen 06/24/2022   Rash 06/15/2022   Postoperative ileus ([place]) 02/27/2022   Ileus, postoperative ([place]) 02/26/2022   Choledocholithiasis 02/19/2022   DNR (do not resuscitate) 02/19/2022   Left elbow pain 01/26/2022   Mid back pain on left side 01/26/2022   Rib pain 01/26/2022   Acute pain of left shoulder 11/12/2021   Leukocytes in urine 11/12/2021   Urinary frequency 11/12/2021   Thrush 10/08/2021   Hemiplegia, dominant side S/P CVA (cerebrovascular accident) ([place]) 09/11/2021   Insomnia    Prediabetes    Acute 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 ([place])    Basal ganglia infarction ([place]) 07/29/2021   Transaminitis 07/27/2021   UTI (urinary tract infection) 07/27/2021   CVA (cerebral vascular accident) ([place]) 07/27/2021   Fall 07/27/2021   Hyperglycemia 07/27/2021   Cholelithiasis 07/27/2021   Hypoxia 07/27/2021   Nausea and vomiting 81/15/7262   Acute metabolic encephalopathy 03/55/9741   Normocytic anemia 07/27/2021   Chronic back pain 07/27/2021   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[place]) 06/22/2021   Closed fracture of first lumbar vertebra with routine healing 02/03/2021   Closed fracture of multiple ribs 11/18/2020   Anxiety 01/29/2020   Leg pain, bilateral 01/29/2020   Ingrown toenail 07/13/2019   Lumbar spondylosis 05/02/2018   Pain in left knee 03/09/2018   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   Trochanteric bursitis of left hip 01/16/2018   Preventative health care 09/26/2017   HTN (hypertension) 07/19/2015   Hyperlipidemia 07/19/2015   Great toe pain 02/11/2014   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   Obesity (BMI  30-39.9) 06/25/2013   Renal insufficiency 06/25/2013   Weakness of left arm 06/25/2013   Sebaceous cyst 03/03/2011   Sprain of lumbar region 07/31/2010   Rib pain, left 08/29/2009   Carotid artery stenosis, asymptomatic, bilateral 05/02/2009   Eczema, atopic 05/31/2008   Vitamin D deficiency 03/01/2008   BPH (benign prostatic hyperplasia) 08/06/2007   Fasting hyperglycemia 12/21/2006   History of right MCA infarct 2006    ONSET DATE: 06/29/22  REFERRING DIAG:  Diagnosis  I63.9 (ICD-10-CM) - Cerebrovascular accident (CVA), unspecified mechanism ([place])  W19.XXXD (ICD-10-CM) - Fall, subsequent encounter    THERAPY DIAG:  Other lack of coordination  Muscle weakness (generalized)  Hemiplegia and hemiparesis following cerebral infarction affecting left non-dominant side (HCC)  History of falling  Difficulty in walking, not elsewhere classified  Rationale for Evaluation and Treatment: Rehabilitation  SUBJECTIVE:  SUBJECTIVE STATEMENT:  no new complaints  accompanied by his wife [name]. PERTINENT HISTORY:  William [name] is a 82 year old man with dementia, CKD, HTN, CAD, HLD and history of CVA  (2006, 2021), Parkinson's  PAIN:  Are you having pain? No  PRECAUTIONS: Fall  WEIGHT BEARING RESTRICTIONS: No  FALLS: Has patient fallen in last 6 months? No  LIVING ENVIRONMENT: Lives with: lives with their family and lives with their spouse Lives in: House/apartment Stairs:  1 brick high step Has following equipment at home: Environmental consultant - 2 wheeled, Wheelchair (manual), Shower bench, bed side commode, Grab bars, and hospital bed, sliding pad for car  PLOF: Independent with basic ADLs and Independent with household mobility without device  PATIENT GOALS: Walk around the block with his wife. Be able  to use the dining room chair rather than have to use the W/C. Increased I with bed mobility and simple tasks at home like make some coffee or brush his teeth, Step over tub to shower, has bench, but he can't really use it. Up and down the one step to get to back deck.  OBJECTIVE:   DIAGNOSTIC FINDINGS:  MRI 2021 with degenerative changes in lumbar spine, L3-4 R subarticular stenosis  COGNITION: Overall cognitive status: History of cognitive impairments - at baseline   SENSATION: Not tested  COORDINATION: Moderately impaired BLE   MUSCLE LENGTH: Hamstrings: severely restricted B Thomas test: Severely restricted B.  POSTURE: rounded shoulders, forward head, increased thoracic kyphosis, posterior pelvic tilt, and flexed trunk   LOWER EXTREMITY ROM:   BLE extremely stiff throughout, limited hip ROM in all planes, B knees limited in extension.   LOWER EXTREMITY MMT:  3+/5 throughout. Unable to determine accuracy of MMT due to cognition. Functionally demosntrates poor coordinated activation and poor muscular endurance.   BED MOBILITY: Min A for Supine<> Sit. Patient was using a ladder in his hospital bed and could move MI.  TRANSFERS: Min A, mod VC, tends to lean back.  CURB: Min A-Has one step out to his deck.  STAIRS: N/A   GAIT: Gait pattern: step to pattern, decreased arm swing- Right, decreased arm swing- Left, decreased step length- Right, decreased step length- Left, shuffling, festinating, trunk flexed, narrow BOS, poor foot clearance- Right, and poor foot clearance- Left Distance walked: 13' Assistive device utilized: Environmental consultant - 2 wheeled Level of assistance: Occasional min A Comments: mod VC to stay close to walker, take longer steps.  FUNCTIONAL TESTS:  5 times sit to stand: 38.23 Timed up and go (TUG): 47.91  with RW  TODAY'S TREATMENT:                                                                                                                               DATE:  07/15/22: Neuromuscular re education:   Nustep L5 5 min, for tissue perfusion, endurance, coordination At steps in smaller room:  Standing for toe raises 10x  Full turn  R and L , x 2, with patient cued to place himself in front of mirror-mirror used for feedback to improve spatial awareness Standing for forward/back steps over airex balance beam 10x each leg, cues to increase step length particularly on L Standing lateral step overs airex balance, cues to place both feet over, patient tends to avoid placing second foot down Seated for forward rolls with 65 cm therapeutic ball , with partial standing, 10 reps Standing for bouncing large ball back and forth with patient's wife, facing R and facing L  Seated high march overs with each leg, over cuff weight on floor Standing at steps with 21/2# cuff weight on each leg, for hip flex/ext and hip abd, 10 x each Gait without device 2 separate sets, 100'. Verbal cues to elongate step length B  07/13/22 NuStep L5 x 5 minutes Seated forward weight shift, reaching 2# weight forward to touch RW, then moving sit to stand and reaching up. Required mod VC to lean forward and then to reach up with LUE. 8 reps. Standing lateral step out and back, required mod TC to weight shift onto LLE in abd, 10 each way Moved to quiet environment- Step back, rotate and reach behind to touch mat, 5 x on each side. Stood and reached up to above shoulder height to pick up object with L hand, walk back across 9' room to place on mat. Repeat x 4, using LUE to reach, CGA, no unsteadiness. Gait training with VC for longer step length, CGA, no AD, 2 x 100', including tihgt spaces, stepping back, turning.  07/07/22  Education  PATIENT EDUCATION: Education details: POC Person educated: Patient and Spouse Education method: Explanation Education comprehension: verbalized understanding  HOME EXERCISE PROGRAM: TBD  GOALS: Goals reviewed with patient? Yes  SHORT TERM  GOALS: Target date: 07/19/22  I with initial HEP Baseline: Goal status: ongoing  2. Decrease 5 x STS to < 25 sec to demonstrate improved functional strength  Baseline: 38 sec  Goal status: INITIAL  3. Decrease TUG to < 35 sec to demonstrate improved functional balance  Baseline: 47 sec  Goal status: INITIAL  4. Perform sit <> stand transfers from average chair height with S and VC to be able to use the swivel chair at his dining table.  Baseline: Min A  Goal status: INITIAL  LONG TERM GOALS: Target date: 09/29/22  Patient will be able to step over his tub at home using grab bars for stability with CGA to access his shower. Baseline: Unable- has bench, but wife reports he steps over then sits. Goal status: INITIAL  2.  Decrease 5 x STS to < 18 sec to demonstrate improved functional strength Baseline:  Goal status: INITIAL  3.  Decrease TUG to < 25 sec to demonstrate improved functional balance Baseline:  Goal status: ongoing  4.  Patient will perform his bed mobility with MI. Baseline: Min A Goal status: INITIAL  5.  Patient will ambulate at least 400' with LRAD, CGA, demonstrate improved upright posture, longer step length. Baseline:  Goal status: INITIAL  6.  Patient will manage up and down a single step with LRAD, CGA to access his deck at home. Baseline: Unable Goal status: INITIAL  ASSESSMENT:  CLINICAL IMPRESSION: Patient attending skilled PT to address balance, motor planning deficits.  Continued to be very distractible, most of session in the smaller treatment room, also placed mirror in front of him to assist with attention.  Decreased awareness L extremities, with decreased  amplitude of movement noted and some neglect L hand and L LE.  Performed better with a visual target, particularly with sit to stand.  Required constant S/ close CGA to Min A for entire treatment due to his judgement, safety.   OBJECTIVE IMPAIRMENTS: Abnormal gait, decreased activity  tolerance, decreased balance, decreased cognition, decreased coordination, decreased endurance, decreased mobility, difficulty walking, decreased ROM, decreased strength, decreased safety awareness, impaired flexibility, impaired UE functional use, improper body mechanics, and postural dysfunction.   ACTIVITY LIMITATIONS: carrying, lifting, bending, sitting, standing, squatting, stairs, transfers, bed mobility, bathing, toileting, dressing, hygiene/grooming, and locomotion level  PARTICIPATION LIMITATIONS: meal prep, cleaning, laundry, interpersonal relationship, shopping, and community activity  PERSONAL FACTORS: Age, Past/current experiences, and 3+ comorbidities: CVA, Parkinson's traits, dementia  are also affecting patient's functional outcome.   REHAB POTENTIAL: Good  CLINICAL DECISION MAKING: Evolving/moderate complexity  EVALUATION COMPLEXITY: Moderate  PLAN:  PT FREQUENCY: 2x/week  PT DURATION: 12 weeks  PLANNED INTERVENTIONS: Therapeutic exercises, Therapeutic activity, Neuromuscular re-education, Balance training, Gait training, Patient/Family education, Self Care, Joint mobilization, Stair training, Moist heat, and Manual therapy  PLAN FOR NEXT SESSION: Trunk and extremity mobilization, balance, strength, functional mobility re-education.   Marcelina Morel, DPT 07/15/2022, 12:02 PM

## 2022-07-19 ENCOUNTER — Other Ambulatory Visit: Payer: Self-pay

## 2022-07-19 ENCOUNTER — Ambulatory Visit: Payer: PPO

## 2022-07-19 DIAGNOSIS — I69354 Hemiplegia and hemiparesis following cerebral infarction affecting left non-dominant side: Secondary | ICD-10-CM

## 2022-07-19 DIAGNOSIS — R278 Other lack of coordination: Secondary | ICD-10-CM | POA: Diagnosis not present

## 2022-07-19 DIAGNOSIS — M6281 Muscle weakness (generalized): Secondary | ICD-10-CM

## 2022-07-19 DIAGNOSIS — R41840 Attention and concentration deficit: Secondary | ICD-10-CM

## 2022-07-19 DIAGNOSIS — I69318 Other symptoms and signs involving cognitive functions following cerebral infarction: Secondary | ICD-10-CM

## 2022-07-19 DIAGNOSIS — Z9181 History of falling: Secondary | ICD-10-CM

## 2022-07-19 DIAGNOSIS — R262 Difficulty in walking, not elsewhere classified: Secondary | ICD-10-CM

## 2022-07-19 NOTE — Therapy (Signed)
OUTPATIENT PHYSICAL THERAPY NEURO EVALUATION   Patient Name: William [name] MRN: [id number] DOB:08/09/1940, 82 y.o., male Today's Date: 07/19/2022   PCP: Ann Held  DO REFERRING PROVIDER: same  END OF SESSION:  PT End of Session - 07/19/22 1511     Visit Number 4    Date for PT Re-Evaluation 09/29/22    PT Start Time 1515    PT Stop Time 1600    PT Time Calculation (min) 45 min    Equipment Utilized During Treatment Gait belt    Activity Tolerance Patient tolerated treatment well    Behavior During Therapy Restless;Impulsive                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   HTN (hypertension) 07/19/2015   Hyperlipidemia    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   Mild, related to stroke history   Nocturia    Renal insufficiency 06/25/2013   S/P carotid endarterectomy    BILATERAL ICA--  PATENT PER DUPLEX  05-19-2012   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/26/2017   Right calf   Urinary frequency    Vitamin D deficiency    Past Surgical History:  Procedure Laterality Date   APPENDECTOMY  AS CHILD   CARDIOVASCULAR STRESS TEST  03-27-2012  DR CRENSHAW   LOW RISK LEXISCAN STUDY-- PROBABLE NORMAL PERFUSION AND SOFT TISSUE ATTENUATION/  NO ISCHEMIA/ EF 51%   CAROTID ENDARTERECTOMY Bilateral LEFT  11-12-2008  DR GREG HAYES   RIGHT ICA  2006  ([facility])   CHOLECYSTECTOMY N/A 02/23/2022   Procedure: LAPAROSCOPIC CHOLECYSTECTOMY;  Surgeon: Felicie Morn, MD;  Location: [facility];  Service: General;  Laterality: N/A;   CYSTOSCOPY W/  RETROGRADES Bilateral 06/22/2021   Procedure: CYSTOSCOPY WITH RETROGRADE PYELOGRAM;  Surgeon: Franchot Gallo, MD;  Location: [facility];  Service: Urology;  Laterality: Bilateral;   CYSTOSCOPY WITH LITHOLAPAXY N/A 02/26/2013   Procedure: CYSTOSCOPY WITH LITHOLAPAXY;  Surgeon: Franchot Gallo, MD;  Location[facility];  Service: Urology;  Laterality: N/A;   ENDOSCOPIC RETROGRADE CHOLANGIOPANCREATOGRAPHY (ERCP) WITH PROPOFOL N/A 02/22/2022   Procedure: ENDOSCOPIC RETROGRADE CHOLANGIOPANCREATOGRAPHY (ERCP) WITH PROPOFOL;  Surgeon: Carol Ada, MD;  Location: [place];  Service: Gastroenterology;  Laterality: N/A;   EYE SURGERY  Jan. 2016   cataract surgery both eyes   INGUINAL HERNIA REPAIR Right 11-08-2006   IR KYPHO EA ADDL LEVEL THORACIC OR LUMBAR  02/12/2021   IR RADIOLOGIST EVAL & MGMT  02/18/2021   MASS EXCISION N/A 03/03/2016   Procedure: EXCISION OF BACK  MASS;  Surgeon: Stark Klein, MD;  Location: [facility];  Service: General;  Laterality: N/A;   MOHS SURGERY Left 1/ 2016   Dr Nevada Crane-- Basal cell   PROSTATE SURGERY     REMOVAL OF STONES  02/22/2022   Procedure: REMOVAL OF STONES;  Surgeon: Carol Ada, MD;  Location: [place];  Service: Gastroenterology;;   [name]  02/22/2022   Procedure: [name];  Surgeon: Carol Ada, MD;  Location: [facility];  Service: Gastroenterology;;   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-C N/A 06/22/2021   Procedure: TRANSURETHRAL RESECTION OF BLADDER TUMOR;  Surgeon: Franchot Gallo, MD;  Location: [facility];  Service: Urology;  Laterality: N/A;   TRANSURETHRAL RESECTION OF PROSTATE N/A 02/26/2013   Procedure: TRANSURETHRAL RESECTION OF THE PROSTATE WITH GYRUS INSTRUMENTS;  Surgeon: Franchot Gallo, MD;  Location: [facility];  Service: Urology;  Laterality: N/A;   TRANSURETHRAL RESECTION OF PROSTATE N/A 06/22/2021   Procedure:  TRANSURETHRAL RESECTION OF THE PROSTATE (TURP);  Surgeon: Franchot Gallo, MD;  Location: [facility];  Service: Urology;  Laterality: N/A;   Patient Active Problem List   Diagnosis Date Noted   Nonintractable headache 07/01/2022   Bilateral impacted cerumen 06/24/2022   Rash 06/15/2022   Postoperative ileus ([place]) 02/27/2022   Ileus, postoperative ([place]) 02/26/2022   Choledocholithiasis 02/19/2022   DNR (do not resuscitate) 02/19/2022   Left elbow pain 01/26/2022   Mid back pain on left side 01/26/2022   Rib pain 01/26/2022   Acute pain of left shoulder 11/12/2021   Leukocytes in urine 11/12/2021   Urinary frequency 11/12/2021   Thrush 10/08/2021   Hemiplegia, dominant side S/P CVA (cerebrovascular accident) ([place]) 09/11/2021   Insomnia    Prediabetes    Acute 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 ([place])    Basal ganglia infarction ([place]) 07/29/2021   Transaminitis 07/27/2021   UTI (urinary tract infection) 07/27/2021   CVA (cerebral vascular accident) ([place]) 07/27/2021   Fall 07/27/2021   Hyperglycemia 07/27/2021   Cholelithiasis 07/27/2021   Hypoxia 07/27/2021   Nausea and vomiting 46/27/0350   Acute metabolic encephalopathy 09/38/1829   Normocytic anemia 07/27/2021   Chronic back pain 07/27/2021   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[place]) 06/22/2021   Closed fracture of first lumbar vertebra with routine healing 02/03/2021   Closed fracture of multiple ribs 11/18/2020   Anxiety 01/29/2020   Leg pain, bilateral 01/29/2020   Ingrown toenail 07/13/2019   Lumbar spondylosis 05/02/2018   Pain in left knee 03/09/2018   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   Trochanteric bursitis of left hip 01/16/2018   Preventative health care 09/26/2017   HTN (hypertension) 07/19/2015   Hyperlipidemia 07/19/2015   Great toe pain 02/11/2014   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   Obesity (BMI 30-39.9) 06/25/2013   Renal  insufficiency 06/25/2013   Weakness of left arm 06/25/2013   Sebaceous cyst 03/03/2011   Sprain of lumbar region 07/31/2010   Rib pain, left 08/29/2009   Carotid artery stenosis, asymptomatic, bilateral 05/02/2009   Eczema, atopic 05/31/2008   Vitamin D deficiency 03/01/2008   BPH (benign prostatic hyperplasia) 08/06/2007   Fasting hyperglycemia 12/21/2006   History of right MCA infarct 2006    ONSET DATE: 06/29/22  REFERRING DIAG:  Diagnosis  I63.9 (ICD-10-CM) - Cerebrovascular accident (CVA), unspecified mechanism ([place])  W19.XXXD (ICD-10-CM) - Fall, subsequent encounter    THERAPY DIAG:  Other lack of coordination  Muscle weakness (generalized)  Hemiplegia and hemiparesis following cerebral infarction affecting left non-dominant side (HCC)  History of falling  Difficulty in walking, not elsewhere classified  Attention and concentration deficit  Other symptoms and signs involving cognitive functions following cerebral infarction  Rationale for Evaluation and Treatment: Rehabilitation  SUBJECTIVE:  SUBJECTIVE STATEMENT:  no new problems, wife reports patient walking some without walker with her assistance  PERTINENT HISTORY:  CATALDO COSGRIFF is a 82 year old man with dementia, CKD, HTN, CAD, HLD and history of CVA  (2006, 2021), Parkinson's  PAIN:  Are you having pain? No  PRECAUTIONS: Fall  WEIGHT BEARING RESTRICTIONS: No  FALLS: Has patient fallen in last 6 months? No  LIVING ENVIRONMENT: Lives with: lives with their family and lives with their spouse Lives in: House/apartment Stairs:  1 brick high step Has following equipment at home: Environmental consultant - 2 wheeled, Wheelchair (manual), Shower bench, bed side commode, Grab bars, and hospital bed, sliding pad for car  PLOF: Independent  with basic ADLs and Independent with household mobility without device  PATIENT GOALS: Walk around the block with his wife. Be able to use the dining room chair rather than have to use the W/C. Increased I with bed mobility and simple tasks at home like make some coffee or brush his teeth, Step over tub to shower, has bench, but he can't really use it. Up and down the one step to get to back deck.  OBJECTIVE:   DIAGNOSTIC FINDINGS:  MRI 2021 with degenerative changes in lumbar spine, L3-4 R subarticular stenosis  COGNITION: Overall cognitive status: History of cognitive impairments - at baseline   SENSATION: Not tested  COORDINATION: Moderately impaired BLE   MUSCLE LENGTH: Hamstrings: severely restricted B Thomas test: Severely restricted B.  POSTURE: rounded shoulders, forward head, increased thoracic kyphosis, posterior pelvic tilt, and flexed trunk   LOWER EXTREMITY ROM:   BLE extremely stiff throughout, limited hip ROM in all planes, B knees limited in extension.   LOWER EXTREMITY MMT:  3+/5 throughout. Unable to determine accuracy of MMT due to cognition. Functionally demosntrates poor coordinated activation and poor muscular endurance.   BED MOBILITY: Min A for Supine<> Sit. Patient was using a ladder in his hospital bed and could move MI.  TRANSFERS: Min A, mod VC, tends to lean back.  CURB: Min A-Has one step out to his deck.  STAIRS: N/A   GAIT: Gait pattern: step to pattern, decreased arm swing- Right, decreased arm swing- Left, decreased step length- Right, decreased step length- Left, shuffling, festinating, trunk flexed, narrow BOS, poor foot clearance- Right, and poor foot clearance- Left Distance walked: 57' Assistive device utilized: Environmental consultant - 2 wheeled Level of assistance: Occasional min A Comments: mod VC to stay close to walker, take longer steps.  FUNCTIONAL TESTS:  5 times sit to stand: 38.23 Timed up and go (TUG): 47.91  with RW  TODAY'S  TREATMENT:                                                                                                                               DATE:  07/19/22:   Nustep L5 5 min, for tissue perfusion, endurance, coordination At steps in smaller room:  Standing for toe raises 10x  Tandem standing forward foot on airex, with trunk rotation to each side, to focus on visual cue( waved to his wife) attempted to perform without UE support  Full turn R and L , x 2, with patient cued to place himself in front of mirror-mirror used for feedback to improve spatial awareness In ll bars  forward steps over airex balance beam  4 lengths, with 2 airex balance beams , cues to increase step length particularly on L Standing lateral step overs airex balance beam cues to place both feet over, patient tends to avoid placing second foot down  Seated for forward rolls with 65 cm therapeutic ball , with partial standing, 10 reps  Seated high marching with alternate arm punches, one 30 sec bout, patinet unable to really complete effectively Standing at steps with 21/2# cuff weight on each leg, for hip flex/ext and hip abd, 10 x each Gait without device 2 separate sets, 100'. Verbal cues to elongate step length B  07/15/22: Neuromuscular re education:   Nustep L5 5 min, for tissue perfusion, endurance, coordination At steps in smaller room:  Standing for toe raises 10x  Full turn R and L , x 2, with patient cued to place himself in front of mirror-mirror used for feedback to improve spatial awareness Standing for forward/back steps over airex balance beam 10x each leg, cues to increase step length particularly on L Standing lateral step overs airex balance, cues to place both feet over, patient tends to avoid placing second foot down Seated for forward rolls with 65 cm therapeutic ball , with partial standing, 10 reps Standing for bouncing large ball back and forth with patient's wife, facing R and facing L  Seated high march  overs with each leg, over cuff weight on floor Standing at steps with 21/2# cuff weight on each leg, for hip flex/ext and hip abd, 10 x each Gait without device 2 separate sets, 100'. Verbal cues to elongate step length B  07/13/22 NuStep L5 x 5 minutes Seated forward weight shift, reaching 2# weight forward to touch RW, then moving sit to stand and reaching up. Required mod VC to lean forward and then to reach up with LUE. 8 reps. Standing lateral step out and back, required mod TC to weight shift onto LLE in abd, 10 each way Moved to quiet environment- Step back, rotate and reach behind to touch mat, 5 x on each side. Stood and reached up to above shoulder height to pick up object with L hand, walk back across 9' room to place on mat. Repeat x 4, using LUE to reach, CGA, no unsteadiness. Gait training with VC for longer step length, CGA, no AD, 2 x 100', including tihgt spaces, stepping back, turning.  07/07/22  Education  PATIENT EDUCATION: Education details: POC Person educated: Patient and Spouse Education method: Explanation Education comprehension: verbalized understanding  HOME EXERCISE PROGRAM: TBD  GOALS: Goals reviewed with patient? Yes  SHORT TERM GOALS: Target date: 07/19/22  I with initial HEP Baseline: Goal status: ongoing  2. Decrease 5 x STS to < 25 sec to demonstrate improved functional strength  Baseline: 38 sec  Goal status: INITIAL  3. Decrease TUG to < 35 sec to demonstrate improved functional balance  Baseline: 47 sec  Goal status: INITIAL  4. Perform sit <> stand transfers from average chair height with S and VC to be able to use the swivel chair at his dining table.  Baseline: Min A  Goal status: INITIAL  LONG TERM GOALS: Target date: 09/29/22  Patient will be able to step over his tub at home using grab bars for stability with CGA to access his shower. Baseline: Unable- has bench, but wife reports he steps over then sits. Goal status:  INITIAL  2.  Decrease 5 x STS to < 18 sec to demonstrate improved functional strength Baseline:  Goal status: INITIAL  3.  Decrease TUG to < 25 sec to demonstrate improved functional balance Baseline:  Goal status: ongoing  4.  Patient will perform his bed mobility with MI. Baseline: Min A Goal status: INITIAL  5.  Patient will ambulate at least 400' with LRAD, CGA, demonstrate improved upright posture, longer step length. Baseline:  Goal status: INITIAL  6.  Patient will manage up and down a single step with LRAD, CGA to access his deck at home. Baseline: Unable Goal status: INITIAL  ASSESSMENT:  CLINICAL IMPRESSION: Patient attending skilled PT to address balance, motor planning deficits.  Continued to be very distractible, most of session in the smaller treatment room again today,  Placed mirror in front of him again today to assist with attention.  Decreased awareness L extremities, with decreased amplitude of movement noted and some neglect L hand and L LE.  Required constant S/ close CGA to Min A for entire treatment due to his judgement, safety.  Some improvement from last session with turning and looking behind each shoulder  OBJECTIVE IMPAIRMENTS: Abnormal gait, decreased activity tolerance, decreased balance, decreased cognition, decreased coordination, decreased endurance, decreased mobility, difficulty walking, decreased ROM, decreased strength, decreased safety awareness, impaired flexibility, impaired UE functional use, improper body mechanics, and postural dysfunction.   ACTIVITY LIMITATIONS: carrying, lifting, bending, sitting, standing, squatting, stairs, transfers, bed mobility, bathing, toileting, dressing, hygiene/grooming, and locomotion level  PARTICIPATION LIMITATIONS: meal prep, cleaning, laundry, interpersonal relationship, shopping, and community activity  PERSONAL FACTORS: Age, Past/current experiences, and 3+ comorbidities: CVA, Parkinson's traits,  dementia  are also affecting patient's functional outcome.   REHAB POTENTIAL: Good  CLINICAL DECISION MAKING: Evolving/moderate complexity  EVALUATION COMPLEXITY: Moderate  PLAN:  PT FREQUENCY: 2x/week  PT DURATION: 12 weeks  PLANNED INTERVENTIONS: Therapeutic exercises, Therapeutic activity, Neuromuscular re-education, Balance training, Gait training, Patient/Family education, Self Care, Joint mobilization, Stair training, Moist heat, and Manual therapy  PLAN FOR NEXT SESSION: Trunk and extremity mobilization, balance, strength, functional mobility re-education.   Marcelina Morel, DPT 07/19/2022, 5:31 PM

## 2022-07-21 ENCOUNTER — Ambulatory Visit: Payer: PPO | Admitting: Rehabilitative and Restorative Service Providers"

## 2022-07-21 ENCOUNTER — Encounter: Payer: Self-pay | Admitting: Rehabilitative and Restorative Service Providers"

## 2022-07-21 ENCOUNTER — Encounter: Payer: Self-pay | Admitting: Physical Therapy

## 2022-07-21 ENCOUNTER — Ambulatory Visit: Payer: PPO | Admitting: Physical Therapy

## 2022-07-21 DIAGNOSIS — I69354 Hemiplegia and hemiparesis following cerebral infarction affecting left non-dominant side: Secondary | ICD-10-CM

## 2022-07-21 DIAGNOSIS — R278 Other lack of coordination: Secondary | ICD-10-CM | POA: Diagnosis not present

## 2022-07-21 DIAGNOSIS — N184 Chronic kidney disease, stage 4 (severe): Secondary | ICD-10-CM | POA: Diagnosis not present

## 2022-07-21 DIAGNOSIS — R262 Difficulty in walking, not elsewhere classified: Secondary | ICD-10-CM

## 2022-07-21 DIAGNOSIS — S2249XA Multiple fractures of ribs, unspecified side, initial encounter for closed fracture: Secondary | ICD-10-CM | POA: Diagnosis not present

## 2022-07-21 DIAGNOSIS — Z9181 History of falling: Secondary | ICD-10-CM

## 2022-07-21 DIAGNOSIS — M6281 Muscle weakness (generalized): Secondary | ICD-10-CM

## 2022-07-21 DIAGNOSIS — R41840 Attention and concentration deficit: Secondary | ICD-10-CM

## 2022-07-21 DIAGNOSIS — I6381 Other cerebral infarction due to occlusion or stenosis of small artery: Secondary | ICD-10-CM | POA: Diagnosis not present

## 2022-07-21 DIAGNOSIS — M47816 Spondylosis without myelopathy or radiculopathy, lumbar region: Secondary | ICD-10-CM | POA: Diagnosis not present

## 2022-07-21 DIAGNOSIS — G9341 Metabolic encephalopathy: Secondary | ICD-10-CM | POA: Diagnosis not present

## 2022-07-21 NOTE — Therapy (Signed)
OUTPATIENT OCCUPATIONAL THERAPY TREATMENT NOTE  Patient Name: William [name] MRN: [id number] DOB:25-Feb-1941, 82 y.o., male Today's Date: 07/21/2022  PCP & REFERRING PROVIDER:  Carollee Herter, Alferd Apa, DO    END OF SESSION:  OT End of Session - 07/21/22 1603     Visit Number 2    Number of Visits 12    Date for OT Re-Evaluation 08/20/22    Authorization Type [employer]    OT Start Time 1515    OT Stop Time 1603    OT Time Calculation (min) 48 min    Equipment Utilized During Treatment green t-band, educational materials    Activity Tolerance Patient tolerated treatment well;Patient limited by fatigue;Patient limited by lethargy;Other (comment);No increased pain   Poor cognition and complex presentation   Behavior During Therapy Impulsive             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   HTN (hypertension) 07/19/2015   Hyperlipidemia    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   Mild, related to stroke history   Nocturia    Renal insufficiency 06/25/2013   S/P carotid endarterectomy    BILATERAL ICA--  PATENT PER DUPLEX  05-19-2012   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/26/2017   Right calf   Urinary frequency    Vitamin D deficiency    Past Surgical History:  Procedure Laterality Date   APPENDECTOMY  AS CHILD   CARDIOVASCULAR STRESS TEST  03-27-2012  DR CRENSHAW   LOW RISK LEXISCAN STUDY-- PROBABLE NORMAL PERFUSION AND SOFT TISSUE ATTENUATION/  NO ISCHEMIA/ EF 51%   CAROTID ENDARTERECTOMY Bilateral LEFT  11-12-2008  DR GREG HAYES    RIGHT ICA  2006  ([facility])   CHOLECYSTECTOMY N/A 02/23/2022   Procedure: LAPAROSCOPIC CHOLECYSTECTOMY;  Surgeon: Felicie Morn, MD;  Location: [place];  Service: General;  Laterality: N/A;   CYSTOSCOPY W/ RETROGRADES Bilateral 06/22/2021   Procedure: CYSTOSCOPY WITH RETROGRADE PYELOGRAM;  Surgeon: Franchot Gallo, MD;  Location: [place];  Service: Urology;  Laterality: Bilateral;   CYSTOSCOPY WITH LITHOLAPAXY N/A 02/26/2013   Procedure: CYSTOSCOPY WITH LITHOLAPAXY;  Surgeon: Franchot Gallo, MD;  Location: [facility];  Service: Urology;  Laterality: N/A;   ENDOSCOPIC RETROGRADE CHOLANGIOPANCREATOGRAPHY (ERCP) WITH PROPOFOL N/A 02/22/2022   Procedure: ENDOSCOPIC RETROGRADE CHOLANGIOPANCREATOGRAPHY (ERCP) WITH PROPOFOL;  Surgeon: Carol Ada, MD;  Location: [place];  Service: Gastroenterology;  Laterality: N/A;   EYE SURGERY  Jan. 2016   cataract surgery both eyes   INGUINAL HERNIA REPAIR Right 11-08-2006   IR KYPHO EA ADDL LEVEL THORACIC OR LUMBAR  02/12/2021   IR RADIOLOGIST EVAL & MGMT  02/18/2021   MASS EXCISION N/A 03/03/2016   Procedure: EXCISION OF BACK  MASS;  Surgeon: Stark Klein, MD;  Location: [place];  Service: General;  Laterality: N/A;   MOHS SURGERY Left 1/ 2016   Dr Nevada Crane-- Basal cell   PROSTATE SURGERY     REMOVAL OF STONES  02/22/2022   Procedure: REMOVAL  OF STONES;  Surgeon: Carol Ada, MD;  Location: [facility] ENDOSCOPY;  Service: Gastroenterology;;   [name]  02/22/2022   Procedure: [name];  Surgeon: Carol Ada, MD;  Location: [facility] ENDOSCOPY;  Service: Gastroenterology;;   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-C N/A 06/22/2021   Procedure: TRANSURETHRAL RESECTION OF BLADDER TUMOR;  Surgeon: Franchot Gallo, MD;  Location: [facility];  Service: Urology;  Laterality: N/A;   TRANSURETHRAL RESECTION OF PROSTATE N/A 02/26/2013   Procedure: TRANSURETHRAL RESECTION OF THE  PROSTATE WITH GYRUS INSTRUMENTS;  Surgeon: Franchot Gallo, MD;  Location: [facility]  Service: Urology;  Laterality: N/A;   TRANSURETHRAL RESECTION OF PROSTATE N/A 06/22/2021   Procedure: TRANSURETHRAL RESECTION OF THE PROSTATE (TURP);  Surgeon: Franchot Gallo, MD;  Location: [facility];  Service: Urology;  Laterality: N/A;   Patient Active Problem List   Diagnosis Date Noted   Nonintractable headache 07/01/2022   Bilateral impacted cerumen 06/24/2022   Rash 06/15/2022   Postoperative ileus ([place]) 02/27/2022   Ileus, postoperative ([place]) 02/26/2022   Choledocholithiasis 02/19/2022   DNR (do not resuscitate) 02/19/2022   Left elbow pain 01/26/2022   Mid back pain on left side 01/26/2022   Rib pain 01/26/2022   Acute pain of left shoulder 11/12/2021   Leukocytes in urine 11/12/2021   Urinary frequency 11/12/2021   Thrush 10/08/2021   Hemiplegia, dominant side S/P CVA (cerebrovascular accident) ([place]) 09/11/2021   Insomnia    Prediabetes    Acute 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 ([place])    Basal ganglia infarction ([place]) 07/29/2021   Transaminitis 07/27/2021   UTI (urinary tract infection) 07/27/2021   CVA (cerebral vascular accident) ([place]) 07/27/2021   Fall 07/27/2021   Hyperglycemia 07/27/2021   Cholelithiasis 07/27/2021   Hypoxia 07/27/2021   Nausea and vomiting 87/56/4332   Acute metabolic encephalopathy 95/18/8416   Normocytic anemia 07/27/2021   Chronic back pain 07/27/2021   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[place]) 06/22/2021   Closed fracture of first lumbar vertebra with routine healing 02/03/2021   Closed fracture of multiple ribs 11/18/2020   Anxiety 01/29/2020   Leg pain, bilateral 01/29/2020   Ingrown toenail 07/13/2019   Lumbar spondylosis 05/02/2018   Pain in left knee 03/09/2018   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   Trochanteric bursitis of left hip 01/16/2018   Preventative  health care 09/26/2017   HTN (hypertension) 07/19/2015   Hyperlipidemia 07/19/2015   Great toe pain 02/11/2014   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   Obesity (BMI 30-39.9) 06/25/2013   Renal insufficiency 06/25/2013   Weakness of left arm 06/25/2013   Sebaceous cyst 03/03/2011   Sprain of lumbar region 07/31/2010   Rib pain, left 08/29/2009   Carotid artery stenosis, asymptomatic, bilateral 05/02/2009   Eczema, atopic 05/31/2008   Vitamin D deficiency 03/01/2008   BPH (benign prostatic hyperplasia) 08/06/2007   Fasting hyperglycemia 12/21/2006   History of right MCA infarct 2006    ONSET DATE: Acute exacerbation of chronic symptoms (most recent hospitalization 02/26/22 - 03/12/22, followed by SNF and home health)   REFERRING DIAG:  I63.9 (ICD-10-CM) - Cerebrovascular accident (CVA), unspecified mechanism ([place])  W19.XXXD (ICD-10-CM) - Fall, subsequent encounter    THERAPY DIAG:  Other lack of coordination  Muscle weakness (generalized)  Hemiplegia and hemiparesis following cerebral infarction affecting left non-dominant side (HCC)  Attention and concentration deficit  Rationale for Evaluation and Treatment: Rehabilitation  PERTINENT HISTORY: PMHx includes L basal ganglia and corona  radiata infarct 07/27/21, L lower homonymous quadrantanopia; R MCA infarct in 2006 w/ L-sided hemiparesis, NCD, carotid artery disease (bilateral), HTN, HLD,CKD3, vascular dementia, and hx of bladder cancer. He has had falls in 2023, fracturing his clavicle in August, then he had stomach surgery in Sept 2023. He has He needs assist at baseline for ADLs. When last documented in acute rehab after having his gall-bladder removed, (03/09/22) he required moderate to maximum assist x2 for most ADLs (more assist for ADLs the require standing), and they recommended rehab in skilled nursing setting. He apparently got Covid at some point and also attended a SNF. Once he D/C home, he had some episodes  of combativeness at night  with "sun-downing."  He is communicative and appropriate, though appears easily confused and distracted, not fully oriented to time and place (Ax2, states date is 70 and cannot say what city he lives in). His wife provides most details, and he is participative, though a poor historian at times. In a nut shell- he has been suffering from dementia and post-stroke effects for years, and his issues were recently exacerbated in Sept of last year after abdominal surgery. He has less use of Lt "stroke side" and new, ritualistic "ticks" in mouth and Rt hand/arm.  Wife states that they do have a caregiver come M, W, F for 4 hours in the middle of the day to mainly help him bathe and dress, which usually tires him out. He has been more combative towards the end of the day as well.   PRECAUTIONS: Fall and Other: Lt hemi, poor cognition, LATEX and adhesive allergies, etc.  ;WEIGHT BEARING RESTRICTIONS: No   SUBJECTIVE:   SUBJECTIVE STATEMENT: He states that he is a bit sleepy after waking up from a nap.   Pt accompanied by:  his wife  PAIN:  Are you having pain? Not now at rest Rating: 0/10 at rest now   FALLS: Has patient fallen in last 6 months? Yes. Number of falls at least 1 resulting in clavicle fx (now healed)   LIVING ENVIRONMENT: Lives with: lives with their spouse Lives in: House/apartment Has following equipment at home: manual w/c, 2 w/w, weighted utensils, shower bench (for tub), bed cane, hospital bed, raised toilet, and possibly more  PLOF: Needs assistance with ADLs, Needs assistance with homemaking, Needs assistance with gait, and Needs assistance with transfers  PATIENT GOALS: Top caregiver goals today:  #1- increase personal hygiene skills (brush teeth, shaving) #2- prepare instant coffee or microwave meal #3- improve functional cognitive skills to play games, etc.,  #4- increase Lt arm usage #5- try to help decrease Rt hand/arm perseveration  (flicking, tapping, noise making) #6- Improve fnl tranfers in/out of multiple seats (tub bench, swivel chair, etc.)    OBJECTIVE: (All objective assessments below are from initial evaluation on: 07/07/22 unless otherwise specified.)   HAND DOMINANCE: Right  ADLs: Overall ADLs: Wife states decreased ability since hospitalization in Sept 23.  Transfers/ambulation related to ADLs: Min A transfer, CGA for mobility Eating: set up Grooming: Mod A- needs assist with oral care, shaving, but can comb his hair UB Dressing: Min A  LB Dressing: Dep  Toileting:  Mod  Bathing: Mod  Tub Shower transfers: Min A  Equipment: Radio broadcast assistant, Grab bars, and Feeding equipment  IADLs: completely dependent for years now, except some ability to use microwave which is now not possible after hospitalization   MOBILITY STATUS: Needs Assist: CGA - Min A, Hx of falls, Festination, difficulty with  turns, and neglect on Lt side  POSTURE COMMENTS:  rounded shoulders, forward head, and weight shift left  ACTIVITY TOLERANCE: Activity tolerance: he became tired and somewhat agitated by the end of the OT evaluation today (after PT and OT about 2.5hours with mostly sedate activities in seated positions)  FUNCTIONAL OUTCOME MEASURES: [facility] 17/30  showing problems with visuospatial/executive function, attention, fluency, and orientation. (at least 26/30 is needed to be considered "normal")  [employer] in ADLs: 1/6  (very dependent)  UPPER EXTREMITY ROM:    Active ROM Right eval Left eval  Shoulder scaption 100 85  Shoulder abduction    Shoulder adduction    Shoulder extension    Shoulder internal rotation Reaches small of back Reaches to hip  Shoulder external rotation Reaches back of head  Reaches up to ear  Elbow flexion 150 130  Elbow extension (-10*)  (-35*)  Wrist flexion 35 20  Wrist extension 50 10  (Blank rows = not tested)  UPPER EXTREMITY MMT:    Tested grossly as "push" and "pull":  UE push 4-/5, UE pull 4/5  HAND FUNCTION: Grip strength: Right: 30 lbs; Left: 20 lbs  COORDINATION: Finger Nose Finger test: mildly ataxic but fairly accurate  Box and Blocks:  Right 11blocks, Left 5blocks  MUSCLE TONE:  Rt- normal, Lt- decreased and worsened by inattention/disuse  COGNITION: Overall cognitive status: Impaired and Difficulty to assess due to: severity of deficits; Poor attention, fluency, orientation, and executive function. Recall is fairly good as well as abstraction.  Seems to have personality changes due to vascular dementia- like an OCD type volitional perseveration of tapping with Rt hand that he can somewhat control.  He did become angry and agitated at the end of the session performing car transfer with spouse: he did shout at her.  VISION: Subjective report: no new changes per pt/spouse, uses "cheaters"   VISION ASSESSMENT: Visual fields seem intact though he had latent responses on the left side showing likely inattention/and/neglect, tracking somewhat delayed horizontally and vertically  PERCEPTION: Impaired: Inattention/neglect: does not attend to left visual field and does not attend to left side of body, Body scheme: poor quality of recognition with shaving, etc., and Spatial orientation: decreased in print and in orientation, especially to Lt side  PRAXIS: Not tested specifically but no overt/stated issues using pen, wearing glasses ,etc.   OBSERVATIONS: Pt taps and flicks and does seem to volitionally perseverate on making a rhythm/noise with Rt hand. He states he is aware of it and chooses to do it.  He does also seem to have mild resting and intention tremor.    TODAY'S TREATMENT:                                                                                                                              DATE:  07/21/22: OT provides caregiver education for adapting home environments to increase safety or awareness for the patient.  This includes things  like putting alarming or alerting devices to his left side to increase awareness of the left side, removing clutter and distractions and simplifying the environment, having orienting materials posted with an eye shot, and others.  His caregiver does state doing some of these already.  Additionally she was given paperwork on strategies for specific functional activities like hygiene and shaving and she does state getting him an Copy and it has helped somewhat.  She states that he is better at shaving with it.  They are also given the name of a neuropsychologist who specializes in dementia with him they could get a consultation or second opinion if they desired.  Next to improve the use of the left arm, OT gives him the following home exercise program for bilateral arm use on a wand or cane or dowel rod.  He needs verbal and physical cues as he neglects his left side significantly still.  OT also utilizes certain visual problems or targets to reach towards as his effort and his attention quickly wanes during these exercises.  When cued to "tap the wall" he reaches out or performs better versus "moves side-to-side."  These type of targeting strategies were emphasized to his caregiver and she sees the benefit and states understanding.  Exercises - Seated Shoulder Flexion AAROM with Dowel  - 4-6 x daily - 1 sets - 10-15 reps - Seated Shoulder Abduction AAROM with Dowel  - 4-6 x daily - 1 sets - 10-15 reps - Seated Shoulder External Rotation AAROM with Dowel  - 4-6 x daily - 1 sets - 10-15 reps - Seated Bicep Curls with Bar  - 4-6 x daily - 1 sets - 10-15 reps - Seated Shoulder Row with Anchored Resistance  - 4-6 x daily - 1 sets - 10-15 reps   PATIENT EDUCATION: Education details: See treatment section above for details Person educated: Patient and Spouse Education method: Explanation, Demonstration, Tactile cues, and Verbal cues Education comprehension: verbalized understanding, returned  demonstration, verbal cues required, tactile cues required, and needs further education  HOME EXERCISE PROGRAM: Access Code: [email or web address] URL: [email or web address] Date: 07/21/2022 Prepared by: Benito Mccreedy    GOALS: Goals reviewed with patient? Yes   SHORT TERM GOALS: (STG required if POC>30 days) Target Date: 07/23/22   Pt will demo/state understanding of initial HEP (or home activity suggestions to maintain function as appropriate) to improve functional skills. Goal status: INITIAL   LONG TERM GOALS: Target Date: 08/20/22  Pt will improve functional ability by decreased impairment per Redmond Baseman assessment from 1/6 to 2/6 or better, for better quality of life. Goal status: INITIAL  2.  Caregiver will state his grooming ability/hygiene ability improved from moderate assist to supervision assist. Goal status: INITIAL  3.  He will show better functional use of left arm by improved box and blocks Test score from 5 blocks to at least 10 blocks. Goal status: INITIAL  4.  Pt will show ability to microwave instant coffee or small meal safely, to help decrease caregiver burden. Goal status: INITIAL  5.  He will show improved cognitive ability enough to play a simple game with OT and caregiver. Goal status: INITIAL  6.  Caregiver will state at least 4 ways to decrease perseveration with right arm, also stating that this behaviors at least somewhat better in the home now. Goal status: INITIAL   ASSESSMENT:  CLINICAL IMPRESSION: 07/21/22: Personal hygiene skills/ADLs were addressed today as well as increasing use of the left arm.  Continue plan  of care addressing their other goals  Eval: Patient is a 82 y.o. male who was seen today for occupational therapy evaluation for multiple and complex problems including vascular dementia, new ritualistic behaviors, decreased functional ability, left hemiparesis and worsening left arm function, and more.  He will benefit  from occupational therapy to increase functional ability and quality of life, also to help decrease caregiver burden somewhat.[name]    PLAN:  OT FREQUENCY: 2x/week  OT DURATION: 6 weeks  PLANNED INTERVENTIONS: self care/ADL training, therapeutic exercise, therapeutic activity, neuromuscular re-education, manual therapy, balance training, functional mobility training, moist heat, cryotherapy, patient/family education, cognitive remediation/compensation, visual/perceptual remediation/compensation, psychosocial skills training, energy conservation, coping strategies training, DME and/or AE instructions, and Re-evaluation  RECOMMENDED OTHER SERVICES: He has already seen PT for balance and functional mobility, he would probably benefit from speech for cognitive issues as well, though OT will be working with cognition somewhat functionally now.  CONSULTED AND AGREED WITH PLAN OF CARE: Patient and family member/caregiver  PLAN FOR NEXT SESSION:  Try to do a microwave activity to address his self care goal IADL, attempt to play games for cognitive skills, and review upper extremity range of motion exercises as needed.  Benito Mccreedy, OTR/L, CHT 07/21/2022, 5:16 PM

## 2022-07-21 NOTE — Therapy (Signed)
OUTPATIENT PHYSICAL THERAPY NEURO Treatment   Patient Name: William [name] MRN: [id number] DOB:1941-03-10, 82 y.o., male Today's Date: 07/21/2022   PCP: Lowne Chase, [place] PROVIDER: same  END OF SESSION:  PT End of Session - 07/21/22 1535     Visit Number 5    Date for PT Re-Evaluation 09/29/22    PT Start Time 1600    PT Stop Time 1645    PT Time Calculation (min) 45 min    Equipment Utilized During Treatment Gait belt    Activity Tolerance Patient tolerated treatment well    Behavior During Therapy Impulsive                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   HTN (hypertension) 07/19/2015   Hyperlipidemia    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   Mild, related to stroke history   Nocturia    Renal insufficiency 06/25/2013   S/P carotid endarterectomy    BILATERAL ICA--  PATENT PER DUPLEX  05-19-2012   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/26/2017   Right calf   Urinary frequency    Vitamin D deficiency    Past Surgical History:  Procedure Laterality Date   APPENDECTOMY  AS CHILD   CARDIOVASCULAR STRESS TEST  03-27-2012  DR CRENSHAW   LOW RISK LEXISCAN STUDY-- PROBABLE NORMAL PERFUSION AND SOFT TISSUE ATTENUATION/  NO ISCHEMIA/ EF 51%   CAROTID ENDARTERECTOMY Bilateral LEFT  11-12-2008  DR GREG HAYES   RIGHT ICA  2006  ([facility])   CHOLECYSTECTOMY N/A 02/23/2022   Procedure: LAPAROSCOPIC CHOLECYSTECTOMY;  Surgeon: Felicie Morn, MD;  Location: [facility];  Service: General;  Laterality: N/A;   CYSTOSCOPY W/ RETROGRADES  Bilateral 06/22/2021   Procedure: CYSTOSCOPY WITH RETROGRADE PYELOGRAM;  Surgeon: Franchot Gallo, MD;  Location: [facility];  Service: Urology;  Laterality: Bilateral;   CYSTOSCOPY WITH LITHOLAPAXY N/A 02/26/2013   Procedure: CYSTOSCOPY WITH LITHOLAPAXY;  Surgeon: Franchot Gallo, MD;  Location: [facility];  Service: Urology;  Laterality: N/A;   ENDOSCOPIC RETROGRADE CHOLANGIOPANCREATOGRAPHY (ERCP) WITH PROPOFOL N/A 02/22/2022   Procedure: ENDOSCOPIC RETROGRADE CHOLANGIOPANCREATOGRAPHY (ERCP) WITH PROPOFOL;  Surgeon: Carol Ada, MD;  Location: [facility];  Service: Gastroenterology;  Laterality: N/A;   EYE SURGERY  Jan. 2016   cataract surgery both eyes   INGUINAL HERNIA REPAIR Right 11-08-2006   IR KYPHO EA ADDL LEVEL THORACIC OR LUMBAR  02/12/2021   IR RADIOLOGIST EVAL & MGMT  02/18/2021   MASS EXCISION N/A 03/03/2016   Procedure: EXCISION OF BACK  MASS;  Surgeon: Stark Klein, MD;  Location: [facility];  Service: General;  Laterality: N/A;   MOHS SURGERY Left 1/ 2016   Dr Nevada Crane-- Basal cell   PROSTATE SURGERY     REMOVAL OF STONES  02/22/2022   Procedure: REMOVAL OF STONES;  Surgeon: Carol Ada, MD;  Location: [facility];  Service: Gastroenterology;;   [name]  02/22/2022   Procedure: [name];  Surgeon: Carol Ada, MD;  Location: [facility];  Service: Gastroenterology;;   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-C N/A 06/22/2021   Procedure: TRANSURETHRAL RESECTION OF BLADDER TUMOR;  Surgeon: Franchot Gallo, MD;  Location: [facility];  Service: Urology;  Laterality: N/A;   TRANSURETHRAL RESECTION OF PROSTATE N/A 02/26/2013   Procedure: TRANSURETHRAL RESECTION OF THE PROSTATE WITH GYRUS INSTRUMENTS;  Surgeon: Franchot Gallo, MD;  Location: [facility];  Service: Urology;  Laterality: N/A;   TRANSURETHRAL RESECTION OF PROSTATE N/A 06/22/2021   Procedure: TRANSURETHRAL  RESECTION OF THE PROSTATE (TURP);  Surgeon: Franchot Gallo, MD;  Location: [facility];  Service: Urology;  Laterality: N/A;   Patient Active Problem List   Diagnosis Date Noted   Nonintractable headache 07/01/2022   Bilateral impacted cerumen 06/24/2022   Rash 06/15/2022   Postoperative ileus ([place]) 02/27/2022   Ileus, postoperative ([place]) 02/26/2022   Choledocholithiasis 02/19/2022   DNR (do not resuscitate) 02/19/2022   Left elbow pain 01/26/2022   Mid back pain on left side 01/26/2022   Rib pain 01/26/2022   Acute pain of left shoulder 11/12/2021   Leukocytes in urine 11/12/2021   Urinary frequency 11/12/2021   Thrush 10/08/2021   Hemiplegia, dominant side S/P CVA (cerebrovascular accident) ([place]) 09/11/2021   Insomnia    Prediabetes    Acute 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 ([place])    Basal ganglia infarction ([place]) 07/29/2021   Transaminitis 07/27/2021   UTI (urinary tract infection) 07/27/2021   CVA (cerebral vascular accident) ([place]) 07/27/2021   Fall 07/27/2021   Hyperglycemia 07/27/2021   Cholelithiasis 07/27/2021   Hypoxia 07/27/2021   Nausea and vomiting 67/67/2094   Acute metabolic encephalopathy 70/96/2836   Normocytic anemia 07/27/2021   Chronic back pain 07/27/2021   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[place]) 06/22/2021   Closed fracture of first lumbar vertebra with routine healing 02/03/2021   Closed fracture of multiple ribs 11/18/2020   Anxiety 01/29/2020   Leg pain, bilateral 01/29/2020   Ingrown toenail 07/13/2019   Lumbar spondylosis 05/02/2018   Pain in left knee 03/09/2018   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   Trochanteric bursitis of left hip 01/16/2018   Preventative health care 09/26/2017   HTN (hypertension) 07/19/2015   Hyperlipidemia 07/19/2015   Great toe pain 02/11/2014   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   Obesity (BMI 30-39.9) 06/25/2013   Renal insufficiency  06/25/2013   Weakness of left arm 06/25/2013   Sebaceous cyst 03/03/2011   Sprain of lumbar region 07/31/2010   Rib pain, left 08/29/2009   Carotid artery stenosis, asymptomatic, bilateral 05/02/2009   Eczema, atopic 05/31/2008   Vitamin D deficiency 03/01/2008   BPH (benign prostatic hyperplasia) 08/06/2007   Fasting hyperglycemia 12/21/2006   History of right MCA infarct 2006    ONSET DATE: 06/29/22  REFERRING DIAG:  Diagnosis  I63.9 (ICD-10-CM) - Cerebrovascular accident (CVA), unspecified mechanism ([place])  W19.XXXD (ICD-10-CM) - Fall, subsequent encounter    THERAPY DIAG:  Other lack of coordination  Muscle weakness (generalized)  Hemiplegia and hemiparesis following cerebral infarction affecting left non-dominant side (HCC)  History of falling  Difficulty in walking, not elsewhere classified  Rationale for Evaluation and Treatment: Rehabilitation  SUBJECTIVE:  SUBJECTIVE STATEMENT:  Wife reports that he has not had any falls, feels he fatigues easily, wife and the OT report that he has not been using the walker well, report he has it on 2 wheels and or will leave it and feel that it has been a tripping hazard more so recently PERTINENT HISTORY:  William [name] is a 82 year old man with dementia, CKD, HTN, CAD, HLD and history of CVA  (2006, 2021), Parkinson's  PAIN:  Are you having pain? No  PRECAUTIONS: Fall  WEIGHT BEARING RESTRICTIONS: No  FALLS: Has patient fallen in last 6 months? No  LIVING ENVIRONMENT: Lives with: lives with their family and lives with their spouse Lives in: House/apartment Stairs:  1 brick high step Has following equipment at home: Environmental consultant - 2 wheeled, Wheelchair (manual), Shower bench, bed side commode, Grab bars, and hospital bed, sliding pad for  car  PLOF: Independent with basic ADLs and Independent with household mobility without device  PATIENT GOALS: Walk around the block with his wife. Be able to use the dining room chair rather than have to use the W/C. Increased I with bed mobility and simple tasks at home like make some coffee or brush his teeth, Step over tub to shower, has bench, but he can't really use it. Up and down the one step to get to back deck.  OBJECTIVE:   DIAGNOSTIC FINDINGS:  MRI 2021 with degenerative changes in lumbar spine, L3-4 R subarticular stenosis  COGNITION: Overall cognitive status: History of cognitive impairments - at baseline   SENSATION: Not tested  COORDINATION: Moderately impaired BLE   MUSCLE LENGTH: Hamstrings: severely restricted B Thomas test: Severely restricted B.  POSTURE: rounded shoulders, forward head, increased thoracic kyphosis, posterior pelvic tilt, and flexed trunk   LOWER EXTREMITY ROM:   BLE extremely stiff throughout, limited hip ROM in all planes, B knees limited in extension.   LOWER EXTREMITY MMT:  3+/5 throughout. Unable to determine accuracy of MMT due to cognition. Functionally demosntrates poor coordinated activation and poor muscular endurance.   BED MOBILITY: Min A for Supine<> Sit. Patient was using a ladder in his hospital bed and could move MI.  TRANSFERS: Min A, mod VC, tends to lean back.  CURB: Min A-Has one step out to his deck.  STAIRS: N/A   GAIT: Gait pattern: step to pattern, decreased arm swing- Right, decreased arm swing- Left, decreased step length- Right, decreased step length- Left, shuffling, festinating, trunk flexed, narrow BOS, poor foot clearance- Right, and poor foot clearance- Left Distance walked: 12' Assistive device utilized: Environmental consultant - 2 wheeled Level of assistance: Occasional min A Comments: mod VC to stay close to walker, take longer steps.  FUNCTIONAL TESTS:  5 times sit to stand: 38.23 Timed up and go (TUG): 47.91   with RW, 07/21/22 TUG 29 seconds without device  TODAY'S TREATMENT:  DATE:  07/21/22 Nustep level 5 x 6 minutes TUG 29 seconds without device SBA Volleyball sitting, standing with legs touching table and then without Partial sit ups 6" toe touches Cone touches working on weight shifts, body turns and reaching for balance 4" step ups, close CGA and at times mod A due to LOB, needed a lot of redirecting to stay on task as he is easily distracted Ball kicks with back of knees touching the table Side stepping Walking without device.  Outside around [place] and to his car with wife following with w/c  07/19/22:   Nustep L5 5 min, for tissue perfusion, endurance, coordination At steps in smaller room:  Standing for toe raises 10x Tandem standing forward foot on airex, with trunk rotation to each side, to focus on visual cue( waved to his wife) attempted to perform without UE support  Full turn R and L , x 2, with patient cued to place himself in front of mirror-mirror used for feedback to improve spatial awareness In ll bars  forward steps over airex balance beam  4 lengths, with 2 airex balance beams , cues to increase step length particularly on L Standing lateral step overs airex balance beam cues to place both feet over, patient tends to avoid placing second foot down  Seated for forward rolls with 65 cm therapeutic ball , with partial standing, 10 reps  Seated high marching with alternate arm punches, one 30 sec bout, patinet unable to really complete effectively Standing at steps with 21/2# cuff weight on each leg, for hip flex/ext and hip abd, 10 x each Gait without device 2 separate sets, 100'. Verbal cues to elongate step length B  07/15/22: Neuromuscular re education:   Nustep L5 5 min, for tissue perfusion, endurance, coordination At steps in smaller room:   Standing for toe raises 10x  Full turn R and L , x 2, with patient cued to place himself in front of mirror-mirror used for feedback to improve spatial awareness Standing for forward/back steps over airex balance beam 10x each leg, cues to increase step length particularly on L Standing lateral step overs airex balance, cues to place both feet over, patient tends to avoid placing second foot down Seated for forward rolls with 65 cm therapeutic ball , with partial standing, 10 reps Standing for bouncing large ball back and forth with patient's wife, facing R and facing L  Seated high march overs with each leg, over cuff weight on floor Standing at steps with 21/2# cuff weight on each leg, for hip flex/ext and hip abd, 10 x each Gait without device 2 separate sets, 100'. Verbal cues to elongate step length B  07/13/22 NuStep L5 x 5 minutes Seated forward weight shift, reaching 2# weight forward to touch RW, then moving sit to stand and reaching up. Required mod VC to lean forward and then to reach up with LUE. 8 reps. Standing lateral step out and back, required mod TC to weight shift onto LLE in abd, 10 each way Moved to quiet environment- Step back, rotate and reach behind to touch mat, 5 x on each side. Stood and reached up to above shoulder height to pick up object with L hand, walk back across 9' room to place on mat. Repeat x 4, using LUE to reach, CGA, no unsteadiness. Gait training with VC for longer step length, CGA, no AD, 2 x 100', including tihgt spaces, stepping back, turning.  07/07/22  Education  PATIENT EDUCATION: Education  details: POC Person educated: Patient and Spouse Education method: Explanation Education comprehension: verbalized understanding  HOME EXERCISE PROGRAM: TBD  GOALS: Goals reviewed with patient? Yes  SHORT TERM GOALS: Target date: 07/19/22  I with initial HEP Baseline: Goal status: ongoing  2. Decrease 5 x STS to < 25 sec to demonstrate improved  functional strength  Baseline: 38 sec  Goal status: INITIAL  3. Decrease TUG to < 35 sec to demonstrate improved functional balance  Baseline: 47 sec  Goal status: met 07/21/22  4. Perform sit <> stand transfers from average chair height with S and VC to be able to use the swivel chair at his dining table.  Baseline: Min A  Goal status: INITIAL  LONG TERM GOALS: Target date: 09/29/22  Patient will be able to step over his tub at home using grab bars for stability with CGA to access his shower. Baseline: Unable- has bench, but wife reports he steps over then sits. Goal status: INITIAL  2.  Decrease 5 x STS to < 18 sec to demonstrate improved functional strength Baseline:  Goal status: INITIAL  3.  Decrease TUG to < 25 sec to demonstrate improved functional balance Baseline:  Goal status: ongoing  4.  Patient will perform his bed mobility with MI. Baseline: Min A Goal status: INITIAL  5.  Patient will ambulate at least 400' with LRAD, CGA, demonstrate improved upright posture, longer step length. Baseline:  Goal status: INITIAL  6.  Patient will manage up and down a single step with LRAD, CGA to access his deck at home. Baseline: Unable Goal status: INITIAL  ASSESSMENT:  CLINICAL IMPRESSION: Patient attending skilled PT to address balance, motor planning deficits.  Continued to be very distractible, requires cues ot stay on task and for safety, we talked about not using the walker at times if it is a good day but needing CGA for this, with the walker he tends to not use it correctly and it at times is a tripping hazard for him.  Tried some higher level balance activities, he did well but biggest issue is getting up from sitting needing multiple attempts and verbal cues  OBJECTIVE IMPAIRMENTS: Abnormal gait, decreased activity tolerance, decreased balance, decreased cognition, decreased coordination, decreased endurance, decreased mobility, difficulty walking, decreased ROM,  decreased strength, decreased safety awareness, impaired flexibility, impaired UE functional use, improper body mechanics, and postural dysfunction.   ACTIVITY LIMITATIONS: carrying, lifting, bending, sitting, standing, squatting, stairs, transfers, bed mobility, bathing, toileting, dressing, hygiene/grooming, and locomotion level  PARTICIPATION LIMITATIONS: meal prep, cleaning, laundry, interpersonal relationship, shopping, and community activity  PERSONAL FACTORS: Age, Past/current experiences, and 3+ comorbidities: CVA, Parkinson's traits, dementia  are also affecting patient's functional outcome.   REHAB POTENTIAL: Good  CLINICAL DECISION MAKING: Evolving/moderate complexity  EVALUATION COMPLEXITY: Moderate  PLAN:  PT FREQUENCY: 2x/week  PT DURATION: 12 weeks  PLANNED INTERVENTIONS: Therapeutic exercises, Therapeutic activity, Neuromuscular re-education, Balance training, Gait training, Patient/Family education, Self Care, Joint mobilization, Stair training, Moist heat, and Manual therapy  PLAN FOR NEXT SESSION: Trunk and extremity mobilization, balance, strength, functional mobility re-education.   Lum Babe, PT 07/21/2022, 3:36 PM

## 2022-07-22 DIAGNOSIS — N184 Chronic kidney disease, stage 4 (severe): Secondary | ICD-10-CM | POA: Diagnosis not present

## 2022-07-22 DIAGNOSIS — G9341 Metabolic encephalopathy: Secondary | ICD-10-CM | POA: Diagnosis not present

## 2022-07-22 DIAGNOSIS — M47816 Spondylosis without myelopathy or radiculopathy, lumbar region: Secondary | ICD-10-CM | POA: Diagnosis not present

## 2022-07-22 DIAGNOSIS — S2249XA Multiple fractures of ribs, unspecified side, initial encounter for closed fracture: Secondary | ICD-10-CM | POA: Diagnosis not present

## 2022-07-22 DIAGNOSIS — I6381 Other cerebral infarction due to occlusion or stenosis of small artery: Secondary | ICD-10-CM | POA: Diagnosis not present

## 2022-07-22 NOTE — Therapy (Incomplete)
OUTPATIENT OCCUPATIONAL THERAPY TREATMENT NOTE  Patient Name: William [name] MRN: [id number] DOB:16-Jun-1940, 82 y.o., male Today's Date: 07/21/2022  PCP & REFERRING PROVIDER:  Carollee Herter, Alferd Apa, DO    END OF SESSION:  OT End of Session - 07/21/22 1603     Visit Number 2    Number of Visits 12    Date for OT Re-Evaluation 08/20/22    Authorization Type [employer]    OT Start Time 1515    OT Stop Time 1603    OT Time Calculation (min) 48 min    Equipment Utilized During Treatment green t-band, educational materials    Activity Tolerance Patient tolerated treatment well;Patient limited by fatigue;Patient limited by lethargy;Other (comment);No increased pain   Poor cognition and complex presentation   Behavior During Therapy Impulsive             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   HTN (hypertension) 07/19/2015   Hyperlipidemia    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   Mild, related to stroke history   Nocturia    Renal insufficiency 06/25/2013   S/P carotid endarterectomy    BILATERAL ICA--  PATENT PER DUPLEX  05-19-2012   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/26/2017   Right calf   Urinary frequency    Vitamin D deficiency    Past Surgical History:  Procedure Laterality Date   APPENDECTOMY  AS CHILD   CARDIOVASCULAR STRESS TEST  03-27-2012  DR CRENSHAW   LOW RISK LEXISCAN STUDY-- PROBABLE NORMAL PERFUSION AND SOFT TISSUE ATTENUATION/  NO ISCHEMIA/ EF 51%   CAROTID ENDARTERECTOMY Bilateral LEFT  11-12-2008  DR GREG HAYES    RIGHT ICA  2006  ([facility])   CHOLECYSTECTOMY N/A 02/23/2022   Procedure: LAPAROSCOPIC CHOLECYSTECTOMY;  Surgeon: Felicie Morn, MD;  Location: [facility];  Service: General;  Laterality: N/A;   CYSTOSCOPY W/ RETROGRADES Bilateral 06/22/2021   Procedure: CYSTOSCOPY WITH RETROGRADE PYELOGRAM;  Surgeon: Franchot Gallo, MD;  Location: [facility];  Service: Urology;  Laterality: Bilateral;   CYSTOSCOPY WITH LITHOLAPAXY N/A 02/26/2013   Procedure: CYSTOSCOPY WITH LITHOLAPAXY;  Surgeon: Franchot Gallo, MD;  Location: [facility];  Service: Urology;  Laterality: N/A;   ENDOSCOPIC RETROGRADE CHOLANGIOPANCREATOGRAPHY (ERCP) WITH PROPOFOL N/A 02/22/2022   Procedure: ENDOSCOPIC RETROGRADE CHOLANGIOPANCREATOGRAPHY (ERCP) WITH PROPOFOL;  Surgeon: Carol Ada, MD;  Location: [facility];  Service: Gastroenterology;  Laterality: N/A;   EYE SURGERY  Jan. 2016   cataract surgery both eyes   INGUINAL HERNIA REPAIR Right 11-08-2006   IR KYPHO EA ADDL LEVEL THORACIC OR LUMBAR  02/12/2021   IR RADIOLOGIST EVAL & MGMT  02/18/2021   MASS EXCISION N/A 03/03/2016   Procedure: EXCISION OF BACK  MASS;  Surgeon: Stark Klein, MD;  Location: [facility];  Service: General;  Laterality: N/A;   MOHS SURGERY Left 1/ 2016   Dr Nevada Crane-- Basal cell   PROSTATE SURGERY     REMOVAL OF STONES  02/22/2022   Procedure: REMOVAL  OF STONES;  Surgeon: Carol Ada, MD;  Location: [facility] ENDOSCOPY;  Service: Gastroenterology;;   [name]  02/22/2022   Procedure: [name];  Surgeon: Carol Ada, MD;  Location: [facility] ENDOSCOPY;  Service: Gastroenterology;;   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-C N/A 06/22/2021   Procedure: TRANSURETHRAL RESECTION OF BLADDER TUMOR;  Surgeon: Franchot Gallo, MD;  Location: [facility];  Service: Urology;  Laterality: N/A;   TRANSURETHRAL RESECTION OF PROSTATE N/A 02/26/2013   Procedure: TRANSURETHRAL RESECTION OF THE  PROSTATE WITH GYRUS INSTRUMENTS;  Surgeon: Franchot Gallo, MD;  Location: [facility];  Service: Urology;  Laterality: N/A;   TRANSURETHRAL RESECTION OF PROSTATE N/A 06/22/2021   Procedure: TRANSURETHRAL RESECTION OF THE PROSTATE (TURP);  Surgeon: Franchot Gallo, MD;  Location: [facility];  Service: Urology;  Laterality: N/A;   Patient Active Problem List   Diagnosis Date Noted   Nonintractable headache 07/01/2022   Bilateral impacted cerumen 06/24/2022   Rash 06/15/2022   Postoperative ileus ([place]) 02/27/2022   Ileus, postoperative ([place]) 02/26/2022   Choledocholithiasis 02/19/2022   DNR (do not resuscitate) 02/19/2022   Left elbow pain 01/26/2022   Mid back pain on left side 01/26/2022   Rib pain 01/26/2022   Acute pain of left shoulder 11/12/2021   Leukocytes in urine 11/12/2021   Urinary frequency 11/12/2021   Thrush 10/08/2021   Hemiplegia, dominant side S/P CVA (cerebrovascular accident) ([place]) 09/11/2021   Insomnia    Prediabetes    Acute 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 ([place])    Basal ganglia infarction ([place]) 07/29/2021   Transaminitis 07/27/2021   UTI (urinary tract infection) 07/27/2021   CVA (cerebral vascular accident) ([place]) 07/27/2021   Fall 07/27/2021   Hyperglycemia 07/27/2021   Cholelithiasis 07/27/2021   Hypoxia 07/27/2021   Nausea and vomiting 75/03/2584   Acute metabolic encephalopathy 27/78/2423   Normocytic anemia 07/27/2021   Chronic back pain 07/27/2021   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[place]) 06/22/2021   Closed fracture of first lumbar vertebra with routine healing 02/03/2021   Closed fracture of multiple ribs 11/18/2020   Anxiety 01/29/2020   Leg pain, bilateral 01/29/2020   Ingrown toenail 07/13/2019   Lumbar spondylosis 05/02/2018   Pain in left knee 03/09/2018   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   Trochanteric bursitis of left hip 01/16/2018   Preventative  health care 09/26/2017   HTN (hypertension) 07/19/2015   Hyperlipidemia 07/19/2015   Great toe pain 02/11/2014   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   Obesity (BMI 30-39.9) 06/25/2013   Renal insufficiency 06/25/2013   Weakness of left arm 06/25/2013   Sebaceous cyst 03/03/2011   Sprain of lumbar region 07/31/2010   Rib pain, left 08/29/2009   Carotid artery stenosis, asymptomatic, bilateral 05/02/2009   Eczema, atopic 05/31/2008   Vitamin D deficiency 03/01/2008   BPH (benign prostatic hyperplasia) 08/06/2007   Fasting hyperglycemia 12/21/2006   History of right MCA infarct 2006    ONSET DATE: Acute exacerbation of chronic symptoms (most recent hospitalization 02/26/22 - 03/12/22, followed by SNF and home health)   REFERRING DIAG:  I63.9 (ICD-10-CM) - Cerebrovascular accident (CVA), unspecified mechanism ([place])  W19.XXXD (ICD-10-CM) - Fall, subsequent encounter    THERAPY DIAG:  Other lack of coordination  Muscle weakness (generalized)  Hemiplegia and hemiparesis following cerebral infarction affecting left non-dominant side (HCC)  Attention and concentration deficit  Rationale for Evaluation and Treatment: Rehabilitation  PERTINENT HISTORY: PMHx includes L basal ganglia and corona  radiata infarct 07/27/21, L lower homonymous quadrantanopia; R MCA infarct in 2006 w/ L-sided hemiparesis, NCD, carotid artery disease (bilateral), HTN, HLD,CKD3, vascular dementia, and hx of bladder cancer. He has had falls in 2023, fracturing his clavicle in August, then he had stomach surgery in Sept 2023. He has He needs assist at baseline for ADLs. When last documented in acute rehab after having his gall-bladder removed, (03/09/22) he required moderate to maximum assist x2 for most ADLs (more assist for ADLs the require standing), and they recommended rehab in skilled nursing setting. He apparently got Covid at some point and also attended a SNF. Once he D/C home, he had some episodes  of combativeness at night  with "sun-downing."  He is communicative and appropriate, though appears easily confused and distracted, not fully oriented to time and place (Ax2, states date is 60 and cannot say what city he lives in). His wife provides most details, and he is participative, though a poor historian at times. In a nut shell- he has been suffering from dementia and post-stroke effects for years, and his issues were recently exacerbated in Sept of last year after abdominal surgery. He has less use of Lt "stroke side" and new, ritualistic "ticks" in mouth and Rt hand/arm.  Wife states that they do have a caregiver come M, W, F for 4 hours in the middle of the day to mainly help him bathe and dress, which usually tires him out. He has been more combative towards the end of the day as well.   PRECAUTIONS: Fall and Other: Lt hemi, poor cognition, LATEX and adhesive allergies, etc.  ;WEIGHT BEARING RESTRICTIONS: No   SUBJECTIVE:   SUBJECTIVE STATEMENT: He states ***   Pt accompanied by:  his wife  PAIN:  Are you having pain? Not now at rest Rating: 0/10 at rest now   FALLS: Has patient fallen in last 6 months? Yes. Number of falls at least 1 resulting in clavicle fx (now healed)   LIVING ENVIRONMENT: Lives with: lives with their spouse Lives in: House/apartment Has following equipment at home: manual w/c, 2 w/w, weighted utensils, shower bench (for tub), bed cane, hospital bed, raised toilet, and possibly more  PLOF: Needs assistance with ADLs, Needs assistance with homemaking, Needs assistance with gait, and Needs assistance with transfers  PATIENT GOALS: Top caregiver goals today:  #1- increase personal hygiene skills (brush teeth, shaving) #2- prepare instant coffee or microwave meal #3- improve functional cognitive skills to play games, etc.,  #4- increase Lt arm usage #5- try to help decrease Rt hand/arm perseveration (flicking, tapping, noise making) #6- Improve fnl  tranfers in/out of multiple seats (tub bench, swivel chair, etc.)    OBJECTIVE: (All objective assessments below are from initial evaluation on: 07/07/22 unless otherwise specified.)   HAND DOMINANCE: Right  ADLs: Overall ADLs: Wife states decreased ability since hospitalization in Sept 23.  Transfers/ambulation related to ADLs: Min A transfer, CGA for mobility Eating: set up Grooming: Mod A- needs assist with oral care, shaving, but can comb his hair UB Dressing: Min A  LB Dressing: Dep  Toileting:  Mod  Bathing: Mod  Tub Shower transfers: Min A  Equipment: Radio broadcast assistant, Grab bars, and Feeding equipment  IADLs: completely dependent for years now, except some ability to use microwave which is now not possible after hospitalization   MOBILITY STATUS: Needs Assist: CGA - Min A, Hx of falls, Festination, difficulty with turns, and neglect on Lt side  POSTURE COMMENTS:  rounded  shoulders, forward head, and weight shift left  ACTIVITY TOLERANCE: Activity tolerance: he became tired and somewhat agitated by the end of the OT evaluation today (after PT and OT about 2.5hours with mostly sedate activities in seated positions)  FUNCTIONAL OUTCOME MEASURES: [facility] 17/30  showing problems with visuospatial/executive function, attention, fluency, and orientation. (at least 26/30 is needed to be considered "normal")  [employer] in ADLs: 1/6  (very dependent)  UPPER EXTREMITY ROM:    Active ROM Right eval Left eval  Shoulder scaption 100 85  Shoulder abduction    Shoulder adduction    Shoulder extension    Shoulder internal rotation Reaches small of back Reaches to hip  Shoulder external rotation Reaches back of head  Reaches up to ear  Elbow flexion 150 130  Elbow extension (-10*)  (-35*)  Wrist flexion 35 20  Wrist extension 50 10  (Blank rows = not tested)  UPPER EXTREMITY MMT:    Tested grossly as "push" and "pull": UE push 4-/5, UE pull 4/5  HAND FUNCTION: Grip  strength: Right: 30 lbs; Left: 20 lbs  COORDINATION: Finger Nose Finger test: mildly ataxic but fairly accurate  Box and Blocks:  Right 11blocks, Left 5blocks  MUSCLE TONE:  Rt- normal, Lt- decreased and worsened by inattention/disuse  COGNITION: Overall cognitive status: Impaired and Difficulty to assess due to: severity of deficits; Poor attention, fluency, orientation, and executive function. Recall is fairly good as well as abstraction.  Seems to have personality changes due to vascular dementia- like an OCD type volitional perseveration of tapping with Rt hand that he can somewhat control.  He did become angry and agitated at the end of the session performing car transfer with spouse: he did shout at her.  VISION: Subjective report: no new changes per pt/spouse, uses "cheaters"   VISION ASSESSMENT: Visual fields seem intact though he had latent responses on the left side showing likely inattention/and/neglect, tracking somewhat delayed horizontally and vertically  PERCEPTION: Impaired: Inattention/neglect: does not attend to left visual field and does not attend to left side of body, Body scheme: poor quality of recognition with shaving, etc., and Spatial orientation: decreased in print and in orientation, especially to Lt side  PRAXIS: Not tested specifically but no overt/stated issues using pen, wearing glasses ,etc.   OBSERVATIONS: Pt taps and flicks and does seem to volitionally perseverate on making a rhythm/noise with Rt hand. He states he is aware of it and chooses to do it.  He does also seem to have mild resting and intention tremor.    TODAY'S TREATMENT:                                                                                                                              DATE:  07/26/22: *** Try to do a microwave activity to address his self care goal IADL, attempt to play games for cognitive skills, and review upper extremity range of motion exercises as  needed.  07/21/22: OT provides caregiver education for adapting home environments to increase safety or awareness for the patient.  This includes things like putting alarming or alerting devices to his left side to increase awareness of the left side, removing clutter and distractions and simplifying the environment, having orienting materials posted with an eye shot, and others.  His caregiver does state doing some of these already.  Additionally she was given paperwork on strategies for specific functional activities like hygiene and shaving and she does state getting him an Copy and it has helped somewhat.  She states that he is better at shaving with it.  They are also given the name of a neuropsychologist who specializes in dementia with him they could get a consultation or second opinion if they desired.  Next to improve the use of the left arm, OT gives him the following home exercise program for bilateral arm use on a wand or cane or dowel rod.  He needs verbal and physical cues as he neglects his left side significantly still.  OT also utilizes certain visual problems or targets to reach towards as his effort and his attention quickly wanes during these exercises.  When cued to "tap the wall" he reaches out or performs better versus "moves side-to-side."  These type of targeting strategies were emphasized to his caregiver and she sees the benefit and states understanding.  Exercises - Seated Shoulder Flexion AAROM with Dowel  - 4-6 x daily - 1 sets - 10-15 reps - Seated Shoulder Abduction AAROM with Dowel  - 4-6 x daily - 1 sets - 10-15 reps - Seated Shoulder External Rotation AAROM with Dowel  - 4-6 x daily - 1 sets - 10-15 reps - Seated Bicep Curls with Bar  - 4-6 x daily - 1 sets - 10-15 reps - Seated Shoulder Row with Anchored Resistance  - 4-6 x daily - 1 sets - 10-15 reps   PATIENT EDUCATION: Education details: See treatment section above for details Person educated: Patient and  Spouse Education method: Explanation, Demonstration, Tactile cues, and Verbal cues Education comprehension: verbalized understanding, returned demonstration, verbal cues required, tactile cues required, and needs further education  HOME EXERCISE PROGRAM: Access Code: [email or web address] URL: [email or web address] Date: 07/21/2022 Prepared by: Benito Mccreedy    GOALS: Goals reviewed with patient? Yes   SHORT TERM GOALS: (STG required if POC>30 days) Target Date: 07/23/22   Pt will demo/state understanding of initial HEP (or home activity suggestions to maintain function as appropriate) to improve functional skills. Goal status: INITIAL   LONG TERM GOALS: Target Date: 08/20/22  Pt will improve functional ability by decreased impairment per Redmond Baseman assessment from 1/6 to 2/6 or better, for better quality of life. Goal status: INITIAL  2.  Caregiver will state his grooming ability/hygiene ability improved from moderate assist to supervision assist. Goal status: INITIAL  3.  He will show better functional use of left arm by improved box and blocks Test score from 5 blocks to at least 10 blocks. Goal status: INITIAL  4.  Pt will show ability to microwave instant coffee or small meal safely, to help decrease caregiver burden. Goal status: INITIAL  5.  He will show improved cognitive ability enough to play a simple game with OT and caregiver. Goal status: INITIAL  6.  Caregiver will state at least 4 ways to decrease perseveration with right arm, also stating that this behaviors at least somewhat better in the home now. Goal status: INITIAL  ASSESSMENT:  CLINICAL IMPRESSION: 07/26/22: ***  07/21/22: Personal hygiene skills/ADLs were addressed today as well as increasing use of the left arm.  Continue plan of care addressing their other goals  Eval: Patient is a 82 y.o. male who was seen today for occupational therapy evaluation for multiple and complex problems  including vascular dementia, new ritualistic behaviors, decreased functional ability, left hemiparesis and worsening left arm function, and more.  He will benefit from occupational therapy to increase functional ability and quality of life, also to help decrease caregiver burden somewhat.[name]    PLAN:  OT FREQUENCY: 2x/week  OT DURATION: 6 weeks  PLANNED INTERVENTIONS: self care/ADL training, therapeutic exercise, therapeutic activity, neuromuscular re-education, manual therapy, balance training, functional mobility training, moist heat, cryotherapy, patient/family education, cognitive remediation/compensation, visual/perceptual remediation/compensation, psychosocial skills training, energy conservation, coping strategies training, DME and/or AE instructions, and Re-evaluation  RECOMMENDED OTHER SERVICES: He has already seen PT for balance and functional mobility, he would probably benefit from speech for cognitive issues as well, though OT will be working with cognition somewhat functionally now.  CONSULTED AND AGREED WITH PLAN OF CARE: Patient and family member/caregiver  PLAN FOR NEXT SESSION:  ***  Benito Mccreedy, OTR/L, CHT 07/21/2022, 5:16 PM

## 2022-07-25 ENCOUNTER — Other Ambulatory Visit (HOSPITAL_BASED_OUTPATIENT_CLINIC_OR_DEPARTMENT_OTHER): Payer: Self-pay

## 2022-07-25 ENCOUNTER — Other Ambulatory Visit: Payer: Self-pay | Admitting: Family Medicine

## 2022-07-26 ENCOUNTER — Other Ambulatory Visit (HOSPITAL_BASED_OUTPATIENT_CLINIC_OR_DEPARTMENT_OTHER): Payer: Self-pay

## 2022-07-26 ENCOUNTER — Encounter: Payer: Self-pay | Admitting: Physical Therapy

## 2022-07-26 ENCOUNTER — Ambulatory Visit: Payer: PPO | Admitting: Rehabilitative and Restorative Service Providers"

## 2022-07-26 ENCOUNTER — Ambulatory Visit: Payer: PPO | Admitting: Physical Therapy

## 2022-07-26 DIAGNOSIS — R262 Difficulty in walking, not elsewhere classified: Secondary | ICD-10-CM

## 2022-07-26 DIAGNOSIS — R278 Other lack of coordination: Secondary | ICD-10-CM | POA: Diagnosis not present

## 2022-07-26 DIAGNOSIS — M6281 Muscle weakness (generalized): Secondary | ICD-10-CM

## 2022-07-26 DIAGNOSIS — R296 Repeated falls: Secondary | ICD-10-CM

## 2022-07-26 DIAGNOSIS — I69354 Hemiplegia and hemiparesis following cerebral infarction affecting left non-dominant side: Secondary | ICD-10-CM

## 2022-07-26 DIAGNOSIS — Z9181 History of falling: Secondary | ICD-10-CM

## 2022-07-26 MED ORDER — TRAZODONE HCL 150 MG PO TABS
75.0000 mg | ORAL_TABLET | Freq: Every day | ORAL | 0 refills | Status: DC
  Filled 2022-07-26: qty 20, 40d supply, fill #0

## 2022-07-26 NOTE — Therapy (Signed)
OUTPATIENT OCCUPATIONAL THERAPY TREATMENT NOTE  Patient Name: William [name] MRN: [id number] DOB:1940/12/22, 82 y.o., male Today's Date: 07/28/2022  PCP & REFERRING PROVIDER:  Carollee Herter, Alferd Apa, DO    END OF SESSION:  OT End of Session - 07/28/22 1511     Visit Number 3    Number of Visits 12    Date for OT Re-Evaluation 08/20/22    Authorization Type [employer]    OT Start Time [id number]    OT Stop Time [id number]    OT Time Calculation (min) 41 min    Activity Tolerance Patient tolerated treatment well;Patient limited by fatigue;Patient limited by lethargy;Other (comment);No increased pain   Poor cognition and complex presentation   Behavior During Therapy Impulsive             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   HTN (hypertension) 07/19/2015   Hyperlipidemia    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   Mild, related to stroke history   Nocturia    Renal insufficiency 06/25/2013   S/P carotid endarterectomy    BILATERAL ICA--  PATENT PER DUPLEX  05-19-2012   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   Right calf   Urinary frequency    Vitamin D deficiency    Past Surgical History:  Procedure Laterality Date   APPENDECTOMY  AS CHILD   CARDIOVASCULAR STRESS TEST  03-27-2012  DR CRENSHAW   LOW RISK LEXISCAN STUDY-- PROBABLE NORMAL PERFUSION AND SOFT TISSUE ATTENUATION/  NO ISCHEMIA/ EF 51%   CAROTID ENDARTERECTOMY Bilateral LEFT  11-12-2008  DR GREG HAYES   RIGHT ICA  2006  ([facility])   CHOLECYSTECTOMY N/A 02/23/2022   Procedure:  LAPAROSCOPIC CHOLECYSTECTOMY;  Surgeon: Felicie Morn, MD;  Location: [facility];  Service: General;  Laterality: N/A;   CYSTOSCOPY W/ RETROGRADES Bilateral 06/22/2021   Procedure: CYSTOSCOPY WITH RETROGRADE PYELOGRAM;  Surgeon: Franchot Gallo, MD;  Location: [facility];  Service: Urology;  Laterality: Bilateral;   CYSTOSCOPY WITH LITHOLAPAXY N/A 02/26/2013   Procedure: CYSTOSCOPY WITH LITHOLAPAXY;  Surgeon: Franchot Gallo, MD;  Location: [facility];  Service: Urology;  Laterality: N/A;   ENDOSCOPIC RETROGRADE CHOLANGIOPANCREATOGRAPHY (ERCP) WITH PROPOFOL N/A 02/22/2022   Procedure: ENDOSCOPIC RETROGRADE CHOLANGIOPANCREATOGRAPHY (ERCP) WITH PROPOFOL;  Surgeon: Carol Ada, MD;  Location: [facility];  Service: Gastroenterology;  Laterality: N/A;   EYE SURGERY  Jan. 2016   cataract surgery both eyes   INGUINAL HERNIA REPAIR Right 11-08-2006   IR KYPHO EA ADDL LEVEL THORACIC OR LUMBAR  02/12/2021   IR RADIOLOGIST EVAL & MGMT  02/18/2021   MASS EXCISION N/A 03/03/2016   Procedure: EXCISION OF BACK  MASS;  Surgeon: Stark Klein, MD;  Location: [place];  Service: General;  Laterality: N/A;   MOHS SURGERY Left 1/ 2016   Dr Nevada Crane-- Basal cell   PROSTATE SURGERY     REMOVAL OF STONES  02/22/2022   Procedure: REMOVAL OF STONES;  Surgeon: Carol Ada, MD;  Location: [place];  Service: Gastroenterology;;   [name]  02/22/2022   Procedure: [name];  Surgeon: Carol Ada, MD;  Location[facility];  Service: Gastroenterology;;   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-C N/A 06/22/2021   Procedure: TRANSURETHRAL RESECTION OF BLADDER TUMOR;  Surgeon: Franchot Gallo, MD;  Location: [facility];  Service: Urology;  Laterality: N/A;   TRANSURETHRAL RESECTION OF PROSTATE N/A 02/26/2013   Procedure: TRANSURETHRAL RESECTION OF THE PROSTATE WITH GYRUS INSTRUMENTS;  Surgeon: Franchot Gallo, MD;  Location:  [facility];  Service: Urology;  Laterality: N/A;   TRANSURETHRAL RESECTION OF PROSTATE N/A 06/22/2021   Procedure: TRANSURETHRAL RESECTION OF THE PROSTATE (TURP);  Surgeon: Franchot Gallo, MD;  Location: [facility];  Service: Urology;  Laterality: N/A;   Patient Active Problem List   Diagnosis Date Noted   Nonintractable headache 07/01/2022   Bilateral impacted cerumen 06/24/2022   Rash 06/15/2022   Postoperative ileus ([place]) 02/27/2022   Ileus, postoperative ([place]) 02/26/2022   Choledocholithiasis 02/19/2022   DNR (do not resuscitate) 02/19/2022   Left elbow pain 01/26/2022   Mid back pain on left side 01/26/2022   Rib pain 01/26/2022   Acute pain of left shoulder 11/12/2021   Leukocytes in urine 11/12/2021   Urinary frequency 11/12/2021   Thrush 10/08/2021   Hemiplegia, dominant side S/P CVA (cerebrovascular accident) ([place]) 09/11/2021   Insomnia    Prediabetes    Acute 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 ([place])    Basal ganglia infarction ([place]) 07/29/2021   Transaminitis 07/27/2021   UTI (urinary tract infection) 07/27/2021   CVA (cerebral vascular accident) ([place]) 07/27/2021   Fall 07/27/2021   Hyperglycemia 07/27/2021   Cholelithiasis 07/27/2021   Hypoxia 07/27/2021   Nausea and vomiting [id number]   Acute metabolic encephalopathy [id number]   Normocytic anemia 07/27/2021   Chronic back pain 07/27/2021   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[place]) 06/22/2021   Closed fracture of first lumbar vertebra with routine healing 02/03/2021   Closed fracture of multiple ribs 11/18/2020   Anxiety 01/29/2020   Leg pain, bilateral 01/29/2020   Ingrown toenail 07/13/2019   Lumbar spondylosis 05/02/2018   Pain in left knee 03/09/2018   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   Trochanteric bursitis of left hip 01/16/2018   Preventative health care 09/26/2017   HTN (hypertension) 07/19/2015   Hyperlipidemia  07/19/2015   Great toe pain 02/11/2014   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   Obesity (BMI 30-39.9) 06/25/2013   Renal insufficiency 06/25/2013   Weakness of left arm 06/25/2013   Sebaceous cyst 03/03/2011   Sprain of lumbar region 07/31/2010   Rib pain, left 08/29/2009   Carotid artery stenosis, asymptomatic, bilateral 05/02/2009   Eczema, atopic 05/31/2008   Vitamin D deficiency 03/01/2008   BPH (benign prostatic hyperplasia) 08/06/2007   Fasting hyperglycemia 12/21/2006   History of right MCA infarct 2006    ONSET DATE: Acute exacerbation of chronic symptoms (most recent hospitalization 02/26/22 - 03/12/22, followed by SNF and home health)   REFERRING DIAG:  I63.9 (ICD-10-CM) - Cerebrovascular accident (CVA), unspecified mechanism ([place])  W19.XXXD (ICD-10-CM) - Fall, subsequent encounter    THERAPY DIAG:  Attention and concentration deficit  Muscle weakness (generalized)  Hemiplegia and hemiparesis following cerebral infarction affecting left non-dominant side (HCC)  Acute pain of left shoulder  Other lack of coordination  Rationale for Evaluation and Treatment: Rehabilitation  PERTINENT HISTORY: PMHx includes L basal ganglia and corona radiata infarct 07/27/21, L lower homonymous  quadrantanopia; R MCA infarct in 2006 w/ L-sided hemiparesis, NCD, carotid artery disease (bilateral), HTN, HLD,CKD3, vascular dementia, and hx of bladder cancer. He has had falls in 2023, fracturing his clavicle in August, then he had stomach surgery in Sept 2023. He has He needs assist at baseline for ADLs. When last documented in acute rehab after having his gall-bladder removed, (03/09/22) he required moderate to maximum assist x2 for most ADLs (more assist for ADLs the require standing), and they recommended rehab in skilled nursing setting. He apparently got Covid at some point and also attended a SNF. Once he D/C home, he had some episodes of combativeness at night  with  "sun-downing."  He is communicative and appropriate, though appears easily confused and distracted, not fully oriented to time and place (Ax2, states date is 26 and cannot say what city he lives in). His wife provides most details, and he is participative, though a poor historian at times. In a nut shell- he has been suffering from dementia and post-stroke effects for years, and his issues were recently exacerbated in Sept of last year after abdominal surgery. He has less use of Lt "stroke side" and new, ritualistic "ticks" in mouth and Rt hand/arm.  Wife states that they do have a caregiver come M, W, F for 4 hours in the middle of the day to mainly help him bathe and dress, which usually tires him out. He has been more combative towards the end of the day as well.   PRECAUTIONS: Fall and Other: Lt hemi, poor cognition, LATEX and adhesive allergies, etc.  ;WEIGHT BEARING RESTRICTIONS: No   SUBJECTIVE:   SUBJECTIVE STATEMENT: He states no new complaints today.  His wife states that he microwaved his own coffee this morning.  Pt accompanied by:  his wife  PAIN:  Are you having pain? Not now at rest Rating: 0/10 at rest now   FALLS: Has patient fallen in last 6 months? Yes. Number of falls at least 1 resulting in clavicle fx (now healed)   LIVING ENVIRONMENT: Lives with: lives with their spouse Lives in: House/apartment Has following equipment at home: manual w/c, 2 w/w, weighted utensils, shower bench (for tub), bed cane, hospital bed, raised toilet, and possibly more  PLOF: Needs assistance with ADLs, Needs assistance with homemaking, Needs assistance with gait, and Needs assistance with transfers  PATIENT GOALS: Top caregiver goals today:  #1- increase personal hygiene skills (brush teeth, shaving) #2- prepare instant coffee or microwave meal #3- improve functional cognitive skills to play games, etc.,  #4- increase Lt arm usage #5- try to help decrease Rt hand/arm perseveration  (flicking, tapping, noise making) #6- Improve fnl tranfers in/out of multiple seats (tub bench, swivel chair, etc.)    OBJECTIVE: (All objective assessments below are from initial evaluation on: 07/07/22 unless otherwise specified.)   HAND DOMINANCE: Right  ADLs: Overall ADLs: Wife states decreased ability since hospitalization in Sept 23.  Transfers/ambulation related to ADLs: Min A transfer, CGA for mobility Eating: set up Grooming: Mod A- needs assist with oral care, shaving, but can comb his hair UB Dressing: Min A  LB Dressing: Dep  Toileting:  Mod  Bathing: Mod  Tub Shower transfers: Min A  Equipment: Radio broadcast assistant, Grab bars, and Feeding equipment  IADLs: completely dependent for years now, except some ability to use microwave which is now not possible after hospitalization   MOBILITY STATUS: Needs Assist: CGA - Min A, Hx of falls, Festination, difficulty with turns, and neglect  on Lt side  POSTURE COMMENTS:  rounded shoulders, forward head, and weight shift left  ACTIVITY TOLERANCE: Activity tolerance: he became tired and somewhat agitated by the end of the OT evaluation today (after PT and OT about 2.5hours with mostly sedate activities in seated positions)  FUNCTIONAL OUTCOME MEASURES: [facility] 17/30  showing problems with visuospatial/executive function, attention, fluency, and orientation. (at least 26/30 is needed to be considered "normal")  [employer] in ADLs: 1/6  (very dependent)  UPPER EXTREMITY ROM:    Active ROM Right eval Left eval  Shoulder scaption 100 85  Shoulder abduction    Shoulder adduction    Shoulder extension    Shoulder internal rotation Reaches small of back Reaches to hip  Shoulder external rotation Reaches back of head  Reaches up to ear  Elbow flexion 150 130  Elbow extension (-10*)  (-35*)  Wrist flexion 35 20  Wrist extension 50 10  (Blank rows = not tested)  UPPER EXTREMITY MMT:    Tested grossly as "push" and "pull":  UE push 4-/5, UE pull 4/5  HAND FUNCTION: Grip strength: Right: 30 lbs; Left: 20 lbs  COORDINATION: Finger Nose Finger test: mildly ataxic but fairly accurate  Box and Blocks:  Right 11blocks, Left 5blocks  MUSCLE TONE:  Rt- normal, Lt- decreased and worsened by inattention/disuse  COGNITION: Overall cognitive status: Impaired and Difficulty to assess due to: severity of deficits; Poor attention, fluency, orientation, and executive function. Recall is fairly good as well as abstraction.  Seems to have personality changes due to vascular dementia- like an OCD type volitional perseveration of tapping with Rt hand that he can somewhat control.  He did become angry and agitated at the end of the session performing car transfer with spouse: he did shout at her.  VISION: Subjective report: no new changes per pt/spouse, uses "cheaters"   VISION ASSESSMENT: Visual fields seem intact though he had latent responses on the left side showing likely inattention/and/neglect, tracking somewhat delayed horizontally and vertically  PERCEPTION: Impaired: Inattention/neglect: does not attend to left visual field and does not attend to left side of body, Body scheme: poor quality of recognition with shaving, etc., and Spatial orientation: decreased in print and in orientation, especially to Lt side  PRAXIS: Not tested specifically but no overt/stated issues using pen, wearing glasses ,etc.   OBSERVATIONS: Pt taps and flicks and does seem to volitionally perseverate on making a rhythm/noise with Rt hand. He states he is aware of it and chooses to do it.  He does also seem to have mild resting and intention tremor.    TODAY'S TREATMENT:                                                                                                                              DATE:  07/28/22: Today occupational therapy focused on their personal goals of: Functional transfers, functional cognition to play game, microwave usage,  and increased left arm  usage.  He starts by performing sit to stand and walking to the kitchen area and having seated at a table.  OT then facilitates the process of him reading and attempting to comprehend the directions on a packet of oatmeal, collecting the necessary ingredients safely with functional mobility, safe use of microwave and disposal of trash and cleaning up.  He did have some safety concerns with selecting the incorrect time on the microwave and risk of potential burn with hot bowl that required intervention from the OT.  Otherwise he needed cues for his memory for some of the steps of the process and close supervision for functional mobility.  Next he walks to the therapy room and performs a seated game activity, with bimanual tasks and shuffling cards, cognitive tasks for following directions and taking turns, left hand forced use by OT holding his right hand.  OT also attempted to place objects on the left side of his body to improve awareness of the left side.  At the end of the session OT helps him perform another functional transfer and get into his vehicle with contact-guard assist.   07/21/22: OT provides caregiver education for adapting home environments to increase safety or awareness for the patient.  This includes things like putting alarming or alerting devices to his left side to increase awareness of the left side, removing clutter and distractions and simplifying the environment, having orienting materials posted with an eye shot, and others.  His caregiver does state doing some of these already.  Additionally she was given paperwork on strategies for specific functional activities like hygiene and shaving and she does state getting him an Copy and it has helped somewhat.  She states that he is better at shaving with it.  They are also given the name of a neuropsychologist who specializes in dementia with him they could get a consultation or second opinion if they desired.   Next to improve the use of the left arm, OT gives him the following home exercise program for bilateral arm use on a wand or cane or dowel rod.  He needs verbal and physical cues as he neglects his left side significantly still.  OT also utilizes certain visual problems or targets to reach towards as his effort and his attention quickly wanes during these exercises.  When cued to "tap the wall" he reaches out or performs better versus "moves side-to-side."  These type of targeting strategies were emphasized to his caregiver and she sees the benefit and states understanding.  Exercises - Seated Shoulder Flexion AAROM with Dowel  - 4-6 x daily - 1 sets - 10-15 reps - Seated Shoulder Abduction AAROM with Dowel  - 4-6 x daily - 1 sets - 10-15 reps - Seated Shoulder External Rotation AAROM with Dowel  - 4-6 x daily - 1 sets - 10-15 reps - Seated Bicep Curls with Bar  - 4-6 x daily - 1 sets - 10-15 reps - Seated Shoulder Row with Anchored Resistance  - 4-6 x daily - 1 sets - 10-15 reps   PATIENT EDUCATION: Education details: See treatment section above for details Person educated: Patient and Spouse Education method: Explanation, Demonstration, Tactile cues, and Verbal cues Education comprehension: verbalized understanding, returned demonstration, verbal cues required, tactile cues required, and needs further education  HOME EXERCISE PROGRAM: Access Code: [email or web address] URL: [email or web address] Date: 07/21/2022 Prepared by: Benito Mccreedy    GOALS: Goals reviewed with patient? Yes   SHORT TERM GOALS: (STG required if POC>30 days)  Target Date: 07/23/22   Pt will demo/state understanding of initial HEP (or home activity suggestions to maintain function as appropriate) to improve functional skills. Goal status: 07/28/22- Met (addressed in past 2 sessions)   LONG TERM GOALS: Target Date: 08/20/22  Pt will improve functional ability by decreased impairment per Redmond Baseman  assessment from 1/6 to 2/6 or better, for better quality of life. Goal status: INITIAL  2.  Caregiver will state his grooming ability/hygiene ability improved from moderate assist to supervision assist. Goal status: INITIAL  3.  He will show better functional use of left arm by improved box and blocks Test score from 5 blocks to at least 10 blocks. Goal status: INITIAL  4.  Pt will show ability to microwave instant coffee or small meal safely, to help decrease caregiver burden. Goal status: INITIAL  5.  He will show improved cognitive ability enough to play a simple game with OT and caregiver. Goal status: INITIAL  6.  Caregiver will state at least 4 ways to decrease perseveration with right arm, also stating that this behaviors at least somewhat better in the home now. Goal status: INITIAL   ASSESSMENT:  CLINICAL IMPRESSION: 07/28/22: OT is addressing all of their goals, and these things should be encouraged in similar ways at home.  His wife is very helpful and already doing so much but she could facilitate awareness of the left side in different and new ways, and she was reminded of this, but it has to be a functional fit for their lives.  New learning at this point is likely very difficult or impossible so repetition and environmental cues are stressed.  Continue plan of care.  07/21/22: Personal hygiene skills/ADLs were addressed today as well as increasing use of the left arm.  Continue plan of care addressing their other goals  Eval: Patient is a 82 y.o. male who was seen today for occupational therapy evaluation for multiple and complex problems including vascular dementia, new ritualistic behaviors, decreased functional ability, left hemiparesis and worsening left arm function, and more.  He will benefit from occupational therapy to increase functional ability and quality of life, also to help decrease caregiver burden somewhat.[name]    PLAN:  OT FREQUENCY: 2x/week  OT DURATION: 6  weeks (through 08/20/22 as needed)   PLANNED INTERVENTIONS: self care/ADL training, therapeutic exercise, therapeutic activity, neuromuscular re-education, manual therapy, balance training, functional mobility training, moist heat, cryotherapy, patient/family education, cognitive remediation/compensation, visual/perceptual remediation/compensation, psychosocial skills training, energy conservation, coping strategies training, DME and/or AE instructions, and Re-evaluation  RECOMMENDED OTHER SERVICES: He has already seen PT for balance and functional mobility, he would probably benefit from speech for cognitive issues as well, though OT will be working with cognition somewhat functionally now.  CONSULTED AND AGREED WITH PLAN OF CARE: Patient and family member/caregiver  PLAN FOR NEXT SESSION:  Continue to address their personal goals including functional performance, cognition, increased left arm usage and awareness of the left side.  Benito Mccreedy, OTR/L, CHT 07/28/2022, 5:16 PM

## 2022-07-26 NOTE — Therapy (Signed)
OUTPATIENT PHYSICAL THERAPY NEURO Treatment   Patient Name: William [name] MRN: [id number] DOB:1940/11/25, 82 y.o., male Today's Date: 07/26/2022   PCP: Lowne Chase, [place] PROVIDER: same  END OF SESSION:  PT End of Session - 07/26/22 1600     Visit Number 6    Date for PT Re-Evaluation 09/29/22    PT Start Time 1555    PT Stop Time [id number]    PT Time Calculation (min) 55 min    Equipment Utilized During Treatment Gait belt    Activity Tolerance Patient tolerated treatment well    Behavior During Therapy Impulsive                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   HTN (hypertension) 07/19/2015   Hyperlipidemia    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   Mild, related to stroke history   Nocturia    Renal insufficiency 06/25/2013   S/P carotid endarterectomy    BILATERAL ICA--  PATENT PER DUPLEX  05-19-2012   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/26/2017   Right calf   Urinary frequency    Vitamin D deficiency    Past Surgical History:  Procedure Laterality Date   APPENDECTOMY  AS CHILD   CARDIOVASCULAR STRESS TEST  03-27-2012  DR CRENSHAW   LOW RISK LEXISCAN STUDY-- PROBABLE NORMAL PERFUSION AND SOFT TISSUE ATTENUATION/  NO ISCHEMIA/ EF 51%   CAROTID ENDARTERECTOMY Bilateral LEFT  11-12-2008  DR GREG HAYES   RIGHT ICA  2006  ([facility])   CHOLECYSTECTOMY N/A 02/23/2022   Procedure: LAPAROSCOPIC CHOLECYSTECTOMY;  Surgeon: Felicie Morn, MD;  Location: [place];  Service: General;  Laterality: N/A;   CYSTOSCOPY W/ RETROGRADES  Bilateral 06/22/2021   Procedure: CYSTOSCOPY WITH RETROGRADE PYELOGRAM;  Surgeon: Franchot Gallo, MD;  Location: [place];  Service: Urology;  Laterality: Bilateral;   CYSTOSCOPY WITH LITHOLAPAXY N/A 02/26/2013   Procedure: CYSTOSCOPY WITH LITHOLAPAXY;  Surgeon: Franchot Gallo, MD;  Location: [facility];  Service: Urology;  Laterality: N/A;   ENDOSCOPIC RETROGRADE CHOLANGIOPANCREATOGRAPHY (ERCP) WITH PROPOFOL N/A 02/22/2022   Procedure: ENDOSCOPIC RETROGRADE CHOLANGIOPANCREATOGRAPHY (ERCP) WITH PROPOFOL;  Surgeon: Carol Ada, MD;  Location: [place];  Service: Gastroenterology;  Laterality: N/A;   EYE SURGERY  Jan. 2016   cataract surgery both eyes   INGUINAL HERNIA REPAIR Right 11-08-2006   IR KYPHO EA ADDL LEVEL THORACIC OR LUMBAR  02/12/2021   IR RADIOLOGIST EVAL & MGMT  02/18/2021   MASS EXCISION N/A 03/03/2016   Procedure: EXCISION OF BACK  MASS;  Surgeon: Stark Klein, MD;  Location: [place];  Service: General;  Laterality: N/A;   MOHS SURGERY Left 1/ 2016   Dr Nevada Crane-- Basal cell   PROSTATE SURGERY     REMOVAL OF STONES  02/22/2022   Procedure: REMOVAL OF STONES;  Surgeon: Carol Ada, MD;  Location: [place];  Service: Gastroenterology;;   [name]  02/22/2022   Procedure: [name];  Surgeon: Carol Ada, MD;  Location: [facility];  Service: Gastroenterology;;   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-C N/A 06/22/2021   Procedure: TRANSURETHRAL RESECTION OF BLADDER TUMOR;  Surgeon: Franchot Gallo, MD;  Location[facility];  Service: Urology;  Laterality: N/A;   TRANSURETHRAL RESECTION OF PROSTATE N/A 02/26/2013   Procedure: TRANSURETHRAL RESECTION OF THE PROSTATE WITH GYRUS INSTRUMENTS;  Surgeon: Franchot Gallo, MD;  Location: [facility];  Service: Urology;  Laterality: N/A;   TRANSURETHRAL RESECTION OF PROSTATE N/A 06/22/2021   Procedure: TRANSURETHRAL  RESECTION OF THE PROSTATE (TURP);  Surgeon: Franchot Gallo, MD;  Location: [facility];  Service: Urology;  Laterality: N/A;   Patient Active Problem List   Diagnosis Date Noted   Nonintractable headache 07/01/2022   Bilateral impacted cerumen 06/24/2022   Rash 06/15/2022   Postoperative ileus ([place]) 02/27/2022   Ileus, postoperative ([place]) 02/26/2022   Choledocholithiasis 02/19/2022   DNR (do not resuscitate) 02/19/2022   Left elbow pain 01/26/2022   Mid back pain on left side 01/26/2022   Rib pain 01/26/2022   Acute pain of left shoulder 11/12/2021   Leukocytes in urine 11/12/2021   Urinary frequency 11/12/2021   Thrush 10/08/2021   Hemiplegia, dominant side S/P CVA (cerebrovascular accident) ([place]) 09/11/2021   Insomnia    Prediabetes    Acute 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 ([place])    Basal ganglia infarction ([place]) 07/29/2021   Transaminitis 07/27/2021   UTI (urinary tract infection) 07/27/2021   CVA (cerebral vascular accident) ([place]) 07/27/2021   Fall 07/27/2021   Hyperglycemia 07/27/2021   Cholelithiasis 07/27/2021   Hypoxia 07/27/2021   Nausea and vomiting [id number]   Acute metabolic encephalopathy [id number]   Normocytic anemia 07/27/2021   Chronic back pain 07/27/2021   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[place]) 06/22/2021   Closed fracture of first lumbar vertebra with routine healing 02/03/2021   Closed fracture of multiple ribs 11/18/2020   Anxiety 01/29/2020   Leg pain, bilateral 01/29/2020   Ingrown toenail 07/13/2019   Lumbar spondylosis 05/02/2018   Pain in left knee 03/09/2018   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   Trochanteric bursitis of left hip 01/16/2018   Preventative health care 09/26/2017   HTN (hypertension) 07/19/2015   Hyperlipidemia 07/19/2015   Great toe pain 02/11/2014   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   Obesity (BMI 30-39.9) 06/25/2013   Renal insufficiency  06/25/2013   Weakness of left arm 06/25/2013   Sebaceous cyst 03/03/2011   Sprain of lumbar region 07/31/2010   Rib pain, left 08/29/2009   Carotid artery stenosis, asymptomatic, bilateral 05/02/2009   Eczema, atopic 05/31/2008   Vitamin D deficiency 03/01/2008   BPH (benign prostatic hyperplasia) 08/06/2007   Fasting hyperglycemia 12/21/2006   History of right MCA infarct 2006    ONSET DATE: 06/29/22  REFERRING DIAG:  Diagnosis  I63.9 (ICD-10-CM) - Cerebrovascular accident (CVA), unspecified mechanism ([place])  W19.XXXD (ICD-10-CM) - Fall, subsequent encounter    THERAPY DIAG:  Other lack of coordination  Muscle weakness (generalized)  Hemiplegia and hemiparesis following cerebral infarction affecting left non-dominant side (HCC)  History of falling  Difficulty in walking, not elsewhere classified  Repeated falls  Rationale for Evaluation and Treatment: Rehabilitation  SUBJECTIVE:  SUBJECTIVE STATEMENT: No falls, doing okay PERTINENT HISTORY:  FRIEDRICH ALDRIDGE is a 82 year old man with dementia, CKD, HTN, CAD, HLD and history of CVA  (2006, 2021), Parkinson's  PAIN:  Are you having pain? No  PRECAUTIONS: Fall  WEIGHT BEARING RESTRICTIONS: No  FALLS: Has patient fallen in last 6 months? No  LIVING ENVIRONMENT: Lives with: lives with their family and lives with their spouse Lives in: House/apartment Stairs:  1 brick high step Has following equipment at home: Environmental consultant - 2 wheeled, Wheelchair (manual), Shower bench, bed side commode, Grab bars, and hospital bed, sliding pad for car  PLOF: Independent with basic ADLs and Independent with household mobility without device  PATIENT GOALS: Walk around the block with his wife. Be able to use the dining room chair rather than have to use the  W/C. Increased I with bed mobility and simple tasks at home like make some coffee or brush his teeth, Step over tub to shower, has bench, but he can't really use it. Up and down the one step to get to back deck.  OBJECTIVE:   DIAGNOSTIC FINDINGS:  MRI 2021 with degenerative changes in lumbar spine, L3-4 R subarticular stenosis  COGNITION: Overall cognitive status: History of cognitive impairments - at baseline   SENSATION: Not tested  COORDINATION: Moderately impaired BLE   MUSCLE LENGTH: Hamstrings: severely restricted B Thomas test: Severely restricted B.  POSTURE: rounded shoulders, forward head, increased thoracic kyphosis, posterior pelvic tilt, and flexed trunk   LOWER EXTREMITY ROM:   BLE extremely stiff throughout, limited hip ROM in all planes, B knees limited in extension.   LOWER EXTREMITY MMT:  3+/5 throughout. Unable to determine accuracy of MMT due to cognition. Functionally demosntrates poor coordinated activation and poor muscular endurance.   BED MOBILITY: Min A for Supine<> Sit. Patient was using a ladder in his hospital bed and could move MI.  TRANSFERS: Min A, mod VC, tends to lean back.  CURB: Min A-Has one step out to his deck.  STAIRS: N/A   GAIT: Gait pattern: step to pattern, decreased arm swing- Right, decreased arm swing- Left, decreased step length- Right, decreased step length- Left, shuffling, festinating, trunk flexed, narrow BOS, poor foot clearance- Right, and poor foot clearance- Left Distance walked: 58' Assistive device utilized: Environmental consultant - 2 wheeled Level of assistance: Occasional min A Comments: mod VC to stay close to walker, take longer steps.  FUNCTIONAL TESTS:  5 times sit to stand: 38.23 Timed up and go (TUG): 47.91  with RW, 07/21/22 TUG 29 seconds without device  TODAY'S TREATMENT:                                                                                                                               DATE:   07/26/22 Nustep Level 5 x 6 minutes Sit to stand with various heights x 5 each a lot of verbal cues needed and at times some assist Volley  ball sitting and standing, some ball kicking Gait without device 3x 150 feet with cues for step length and speed also distractions In Pbars side steps, big steps and backward steps In pbars head and trunk rotations Sitting and standing reaching for cones iwht each hand Supine rolling to sides, feet on ball trunk rotation, bridges Bridges without ball Sitting trunk elongations with reaching and twisting Holding onto rail 12 inch toe touches  07/21/22 Nustep level 5 x 6 minutes TUG 29 seconds without device SBA Volleyball sitting, standing with legs touching table and then without Partial sit ups 6" toe touches Cone touches working on weight shifts, body turns and reaching for balance 4" step ups, close CGA and at times mod A due to LOB, needed a lot of redirecting to stay on task as he is easily distracted Ball kicks with back of knees touching the table Side stepping Walking without device.  Outside around [place] and to his car with wife following with w/c  07/19/22:   Nustep L5 5 min, for tissue perfusion, endurance, coordination At steps in smaller room:  Standing for toe raises 10x Tandem standing forward foot on airex, with trunk rotation to each side, to focus on visual cue( waved to his wife) attempted to perform without UE support  Full turn R and L , x 2, with patient cued to place himself in front of mirror-mirror used for feedback to improve spatial awareness In ll bars  forward steps over airex balance beam  4 lengths, with 2 airex balance beams , cues to increase step length particularly on L Standing lateral step overs airex balance beam cues to place both feet over, patient tends to avoid placing second foot down  Seated for forward rolls with 65 cm therapeutic ball , with partial standing, 10 reps  Seated high marching with  alternate arm punches, one 30 sec bout, patinet unable to really complete effectively Standing at steps with 21/2# cuff weight on each leg, for hip flex/ext and hip abd, 10 x each Gait without device 2 separate sets, 100'. Verbal cues to elongate step length B  07/15/22: Neuromuscular re education:   Nustep L5 5 min, for tissue perfusion, endurance, coordination At steps in smaller room:  Standing for toe raises 10x  Full turn R and L , x 2, with patient cued to place himself in front of mirror-mirror used for feedback to improve spatial awareness Standing for forward/back steps over airex balance beam 10x each leg, cues to increase step length particularly on L Standing lateral step overs airex balance, cues to place both feet over, patient tends to avoid placing second foot down Seated for forward rolls with 65 cm therapeutic ball , with partial standing, 10 reps Standing for bouncing large ball back and forth with patient's wife, facing R and facing L  Seated high march overs with each leg, over cuff weight on floor Standing at steps with 21/2# cuff weight on each leg, for hip flex/ext and hip abd, 10 x each Gait without device 2 separate sets, 100'. Verbal cues to elongate step length B  07/13/22 NuStep L5 x 5 minutes Seated forward weight shift, reaching 2# weight forward to touch RW, then moving sit to stand and reaching up. Required mod VC to lean forward and then to reach up with LUE. 8 reps. Standing lateral step out and back, required mod TC to weight shift onto LLE in abd, 10 each way Moved to quiet environment- Step back, rotate and  reach behind to touch mat, 5 x on each side. Stood and reached up to above shoulder height to pick up object with L hand, walk back across 9' room to place on mat. Repeat x 4, using LUE to reach, CGA, no unsteadiness. Gait training with VC for longer step length, CGA, no AD, 2 x 100', including tihgt spaces, stepping back, turning.  07/07/22   Education  PATIENT EDUCATION: Education details: POC Person educated: Patient and Spouse Education method: Explanation Education comprehension: verbalized understanding  HOME EXERCISE PROGRAM: TBD  GOALS: Goals reviewed with patient? Yes  SHORT TERM GOALS: Target date: 07/19/22  I with initial HEP Baseline: Goal status: ongoing  2. Decrease 5 x STS to < 25 sec to demonstrate improved functional strength  Baseline: 38 sec  Goal status: ongoing  3. Decrease TUG to < 35 sec to demonstrate improved functional balance  Baseline: 47 sec  Goal status: met 07/21/22  4. Perform sit <> stand transfers from average chair height with S and VC to be able to use the swivel chair at his dining table.  Baseline: Min A  Goal status: INITIAL  LONG TERM GOALS: Target date: 09/29/22  Patient will be able to step over his tub at home using grab bars for stability with CGA to access his shower. Baseline: Unable- has bench, but wife reports he steps over then sits. Goal status: INITIAL  2.  Decrease 5 x STS to < 18 sec to demonstrate improved functional strength Baseline:  Goal status: INITIAL  3.  Decrease TUG to < 25 sec to demonstrate improved functional balance Baseline:  Goal status: ongoing  4.  Patient will perform his bed mobility with MI. Baseline: Min A Goal status: INITIAL  5.  Patient will ambulate at least 400' with LRAD, CGA, demonstrate improved upright posture, longer step length. Baseline:  Goal status: INITIAL  6.  Patient will manage up and down a single step with LRAD, CGA to access his deck at home. Baseline: Unable Goal status: INITIAL  ASSESSMENT:  CLINICAL IMPRESSION: Patient attending skilled PT to address balance, motor planning deficits.  I tried to have him in places that were less busy to avoid distractions, he did better but still easily distracted.  He did well with walking with cues.  Had nice trunk and bed mobility rolling to his right, seems to  have some left side neglect, does not like to roll to the left  OBJECTIVE IMPAIRMENTS: Abnormal gait, decreased activity tolerance, decreased balance, decreased cognition, decreased coordination, decreased endurance, decreased mobility, difficulty walking, decreased ROM, decreased strength, decreased safety awareness, impaired flexibility, impaired UE functional use, improper body mechanics, and postural dysfunction.   ACTIVITY LIMITATIONS: carrying, lifting, bending, sitting, standing, squatting, stairs, transfers, bed mobility, bathing, toileting, dressing, hygiene/grooming, and locomotion level  PARTICIPATION LIMITATIONS: meal prep, cleaning, laundry, interpersonal relationship, shopping, and community activity  PERSONAL FACTORS: Age, Past/current experiences, and 3+ comorbidities: CVA, Parkinson's traits, dementia  are also affecting patient's functional outcome.   REHAB POTENTIAL: Good  CLINICAL DECISION MAKING: Evolving/moderate complexity  EVALUATION COMPLEXITY: Moderate  PLAN:  PT FREQUENCY: 2x/week  PT DURATION: 12 weeks  PLANNED INTERVENTIONS: Therapeutic exercises, Therapeutic activity, Neuromuscular re-education, Balance training, Gait training, Patient/Family education, Self Care, Joint mobilization, Stair training, Moist heat, and Manual therapy  PLAN FOR NEXT SESSION: Trunk and extremity mobilization, balance, strength, functional mobility re-education.   Lum Babe, PT 07/26/2022, 4:01 PM

## 2022-07-27 ENCOUNTER — Other Ambulatory Visit (HOSPITAL_BASED_OUTPATIENT_CLINIC_OR_DEPARTMENT_OTHER): Payer: Self-pay

## 2022-07-27 DIAGNOSIS — L308 Other specified dermatitis: Secondary | ICD-10-CM | POA: Diagnosis not present

## 2022-07-27 MED ORDER — CLOBETASOL PROPIONATE 0.05 % EX CREA
1.0000 | TOPICAL_CREAM | Freq: Two times a day (BID) | CUTANEOUS | 3 refills | Status: DC | PRN
  Filled 2022-07-27: qty 60, 30d supply, fill #0
  Filled 2022-08-23 (×3): qty 60, 30d supply, fill #1
  Filled 2022-09-19: qty 60, 30d supply, fill #2
  Filled 2022-10-26 (×2): qty 60, 30d supply, fill #3

## 2022-07-28 ENCOUNTER — Encounter: Payer: Self-pay | Admitting: Physical Therapy

## 2022-07-28 ENCOUNTER — Ambulatory Visit: Payer: PPO | Admitting: Rehabilitative and Restorative Service Providers"

## 2022-07-28 ENCOUNTER — Encounter: Payer: Self-pay | Admitting: Rehabilitative and Restorative Service Providers"

## 2022-07-28 ENCOUNTER — Ambulatory Visit: Payer: PPO | Admitting: Physical Therapy

## 2022-07-28 DIAGNOSIS — R278 Other lack of coordination: Secondary | ICD-10-CM

## 2022-07-28 DIAGNOSIS — I69354 Hemiplegia and hemiparesis following cerebral infarction affecting left non-dominant side: Secondary | ICD-10-CM

## 2022-07-28 DIAGNOSIS — M25512 Pain in left shoulder: Secondary | ICD-10-CM

## 2022-07-28 DIAGNOSIS — M6281 Muscle weakness (generalized): Secondary | ICD-10-CM

## 2022-07-28 DIAGNOSIS — R262 Difficulty in walking, not elsewhere classified: Secondary | ICD-10-CM

## 2022-07-28 DIAGNOSIS — Z9181 History of falling: Secondary | ICD-10-CM

## 2022-07-28 DIAGNOSIS — R41840 Attention and concentration deficit: Secondary | ICD-10-CM

## 2022-07-28 DIAGNOSIS — R296 Repeated falls: Secondary | ICD-10-CM

## 2022-07-28 NOTE — Therapy (Signed)
OUTPATIENT PHYSICAL THERAPY NEURO Treatment   Patient Name: William [name] MRN: [id number] DOB:Aug 07, 1940, 82 y.o., male Today's Date: 07/28/2022  PCP: Lowne Chase, [place] PROVIDER: same  END OF SESSION:  PT End of Session - 07/28/22 1508     Visit Number 7    Date for PT Re-Evaluation 09/29/22    PT Start Time 1505    PT Stop Time 1540    PT Time Calculation (min) 35 min    Equipment Utilized During Treatment Gait belt    Activity Tolerance Patient tolerated treatment well    Behavior During Therapy Impulsive            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   HTN (hypertension) 07/19/2015   Hyperlipidemia    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   Mild, related to stroke history   Nocturia    Renal insufficiency 06/25/2013   S/P carotid endarterectomy    BILATERAL ICA--  PATENT PER DUPLEX  05-19-2012   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/26/2017   Right calf   Urinary frequency    Vitamin D deficiency    Past Surgical History:  Procedure Laterality Date   APPENDECTOMY  AS CHILD   CARDIOVASCULAR STRESS TEST  03-27-2012  DR CRENSHAW   LOW RISK LEXISCAN STUDY-- PROBABLE NORMAL PERFUSION AND SOFT TISSUE ATTENUATION/  NO ISCHEMIA/ EF 51%   CAROTID ENDARTERECTOMY Bilateral LEFT  11-12-2008  DR GREG HAYES   RIGHT ICA  2006  ([facility])   CHOLECYSTECTOMY N/A 02/23/2022   Procedure: LAPAROSCOPIC CHOLECYSTECTOMY;  Surgeon: Felicie Morn, MD;  Location: [facility] [place];  Service: General;  Laterality: N/A;   CYSTOSCOPY W/ RETROGRADES Bilateral  06/22/2021   Procedure: CYSTOSCOPY WITH RETROGRADE PYELOGRAM;  Surgeon: Franchot Gallo, MD;  Location: [facility];  Service: Urology;  Laterality: Bilateral;   CYSTOSCOPY WITH LITHOLAPAXY N/A 02/26/2013   Procedure: CYSTOSCOPY WITH LITHOLAPAXY;  Surgeon: Franchot Gallo, MD;  Location: [facility];  Service: Urology;  Laterality: N/A;   ENDOSCOPIC RETROGRADE CHOLANGIOPANCREATOGRAPHY (ERCP) WITH PROPOFOL N/A 02/22/2022   Procedure: ENDOSCOPIC RETROGRADE CHOLANGIOPANCREATOGRAPHY (ERCP) WITH PROPOFOL;  Surgeon: Carol Ada, MD;  Location: [facility];  Service: Gastroenterology;  Laterality: N/A;   EYE SURGERY  Jan. 2016   cataract surgery both eyes   INGUINAL HERNIA REPAIR Right 11-08-2006   IR KYPHO EA ADDL LEVEL THORACIC OR LUMBAR  02/12/2021   IR RADIOLOGIST EVAL & MGMT  02/18/2021   MASS EXCISION N/A 03/03/2016   Procedure: EXCISION OF BACK  MASS;  Surgeon: Stark Klein, MD;  Location: [place];  Service: General;  Laterality: N/A;   MOHS SURGERY Left 1/ 2016   Dr Nevada Crane-- Basal cell   PROSTATE SURGERY     REMOVAL OF STONES  02/22/2022   Procedure: REMOVAL OF STONES;  Surgeon: Carol Ada, MD;  Location: [place];  Service: Gastroenterology;;   [name]  02/22/2022   Procedure: [name];  Surgeon: Carol Ada, MD;  Location: [place];  Service:  Gastroenterology;;   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-C N/A 06/22/2021   Procedure: TRANSURETHRAL RESECTION OF BLADDER TUMOR;  Surgeon: Franchot Gallo, MD;  Location: [facility];  Service: Urology;  Laterality: N/A;   TRANSURETHRAL RESECTION OF PROSTATE N/A 02/26/2013   Procedure: TRANSURETHRAL RESECTION OF THE PROSTATE WITH GYRUS INSTRUMENTS;  Surgeon: Franchot Gallo, MD;  Location: [facility];  Service: Urology;  Laterality: N/A;   TRANSURETHRAL RESECTION OF PROSTATE N/A 06/22/2021   Procedure: TRANSURETHRAL RESECTION OF THE  PROSTATE (TURP);  Surgeon: Franchot Gallo, MD;  Location: [facility];  Service: Urology;  Laterality: N/A;   Patient Active Problem List   Diagnosis Date Noted   Nonintractable headache 07/01/2022   Bilateral impacted cerumen 06/24/2022   Rash 06/15/2022   Postoperative ileus ([place]) 02/27/2022   Ileus, postoperative ([place]) 02/26/2022   Choledocholithiasis 02/19/2022   DNR (do not resuscitate) 02/19/2022   Left elbow pain 01/26/2022   Mid back pain on left side 01/26/2022   Rib pain 01/26/2022   Acute pain of left shoulder 11/12/2021   Leukocytes in urine 11/12/2021   Urinary frequency 11/12/2021   Thrush 10/08/2021   Hemiplegia, dominant side S/P CVA (cerebrovascular accident) ([place]) 09/11/2021   Insomnia    Prediabetes    Acute 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 ([place])    Basal ganglia infarction ([place]) 07/29/2021   Transaminitis 07/27/2021   UTI (urinary tract infection) 07/27/2021   CVA (cerebral vascular accident) ([place]) 07/27/2021   Fall 07/27/2021   Hyperglycemia 07/27/2021   Cholelithiasis 07/27/2021   Hypoxia 07/27/2021   Nausea and vomiting [id number]   Acute metabolic encephalopathy [id number]   Normocytic anemia 07/27/2021   Chronic back pain 07/27/2021   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[place]) 06/22/2021   Closed fracture of first lumbar vertebra with routine healing 02/03/2021   Closed fracture of multiple ribs 11/18/2020   Anxiety 01/29/2020   Leg pain, bilateral 01/29/2020   Ingrown toenail 07/13/2019   Lumbar spondylosis 05/02/2018   Pain in left knee 03/09/2018   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   Trochanteric bursitis of left hip 01/16/2018   Preventative health care 09/26/2017   HTN (hypertension) 07/19/2015   Hyperlipidemia 07/19/2015   Great toe pain 02/11/2014   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   Obesity (BMI 30-39.9) 06/25/2013   Renal insufficiency 06/25/2013    Weakness of left arm 06/25/2013   Sebaceous cyst 03/03/2011   Sprain of lumbar region 07/31/2010   Rib pain, left 08/29/2009   Carotid artery stenosis, asymptomatic, bilateral 05/02/2009   Eczema, atopic 05/31/2008   Vitamin D deficiency 03/01/2008   BPH (benign prostatic hyperplasia) 08/06/2007   Fasting hyperglycemia 12/21/2006   History of right MCA infarct 2006    ONSET DATE: 06/29/22  REFERRING DIAG:  Diagnosis  I63.9 (ICD-10-CM) - Cerebrovascular accident (CVA), unspecified mechanism ([place])  W19.XXXD (ICD-10-CM) - Fall, subsequent encounter    THERAPY DIAG:  Other lack of coordination  Muscle weakness (generalized)  Hemiplegia and hemiparesis following cerebral infarction affecting left non-dominant side (HCC)  Repeated falls  Difficulty in walking, not elsewhere classified  History of falling  Rationale for Evaluation and Treatment: Rehabilitation  SUBJECTIVE:  SUBJECTIVE STATEMENT: No falls, doing okay. He enjoyed his last treatment.  PERTINENT HISTORY:  MATHAYUS HEINIG is a 82 year old man with dementia, CKD, HTN, CAD, HLD and history of CVA  (2006, 2021), Parkinson's  PAIN:  Are you having pain? No  PRECAUTIONS: Fall  WEIGHT BEARING RESTRICTIONS: No  FALLS: Has patient fallen in last 6 months? No  LIVING ENVIRONMENT: Lives with: lives with their family and lives with their spouse Lives in: House/apartment Stairs:  1 brick high step Has following equipment at home: Environmental consultant - 2 wheeled, Wheelchair (manual), Shower bench, bed side commode, Grab bars, and hospital bed, sliding pad for car  PLOF: Independent with basic ADLs and Independent with household mobility without device  PATIENT GOALS: Walk around the block with his wife. Be able to use the dining room chair rather  than have to use the W/C. Increased I with bed mobility and simple tasks at home like make some coffee or brush his teeth, Step over tub to shower, has bench, but he can't really use it. Up and down the one step to get to back deck.  OBJECTIVE:   DIAGNOSTIC FINDINGS:  MRI 2021 with degenerative changes in lumbar spine, L3-4 R subarticular stenosis  COGNITION: Overall cognitive status: History of cognitive impairments - at baseline   SENSATION: Not tested  COORDINATION: Moderately impaired BLE   MUSCLE LENGTH: Hamstrings: severely restricted B Thomas test: Severely restricted B.  POSTURE: rounded shoulders, forward head, increased thoracic kyphosis, posterior pelvic tilt, and flexed trunk   LOWER EXTREMITY ROM:   BLE extremely stiff throughout, limited hip ROM in all planes, B knees limited in extension.   LOWER EXTREMITY MMT:  3+/5 throughout. Unable to determine accuracy of MMT due to cognition. Functionally demosntrates poor coordinated activation and poor muscular endurance.   BED MOBILITY: Min A for Supine<> Sit. Patient was using a ladder in his hospital bed and could move MI.  TRANSFERS: Min A, mod VC, tends to lean back.  CURB: Min A-Has one step out to his deck.  STAIRS: N/A   GAIT: Gait pattern: step to pattern, decreased arm swing- Right, decreased arm swing- Left, decreased step length- Right, decreased step length- Left, shuffling, festinating, trunk flexed, narrow BOS, poor foot clearance- Right, and poor foot clearance- Left Distance walked: 37' Assistive device utilized: Environmental consultant - 2 wheeled Level of assistance: Occasional min A Comments: mod VC to stay close to walker, take longer steps.  FUNCTIONAL TESTS:  5 times sit to stand: 38.23 Timed up and go (TUG): 47.91  with RW, 07/21/22 TUG 29 seconds without device  TODAY'S TREATMENT:                                                                                                                                DATE:  07/28/22 NuStep L3 x 6 minutes. Stand with back to corner. Use BUE to lift red physioball and rotate to R to  touch target on wall, return to cneter, then repeat to L. Walk with ball, reaching straight up to touch overhead target without LOB, CGA for both. Side to side step over 2" obstacle on the floor. He was able to move side to side, but was not able to follow the commands for safe performance. Step over air ex planks on the floor, using R, then L legs, BUE support on parallel bars. Stable Climbing up and down steps using step over step technique. Required cues to move L hand along with his feet. 2# weight in BUE, reach down to knee height to touch mat, turn to R and up to touch target on wall. 10 x in each direction. Good reaching above shoulder height, needed cues to engage L hand.  07/26/22 Nustep Level 5 x 6 minutes Sit to stand with various heights x 5 each a lot of verbal cues needed and at times some assist Volley ball sitting and standing, some ball kicking Gait without device 3x 150 feet with cues for step length and speed also distractions In Pbars side steps, big steps and backward steps In pbars head and trunk rotations Sitting and standing reaching for cones iwht each hand Supine rolling to sides, feet on ball trunk rotation, bridges Bridges without ball Sitting trunk elongations with reaching and twisting Holding onto rail 12 inch toe touches  07/21/22 Nustep level 5 x 6 minutes TUG 29 seconds without device SBA Volleyball sitting, standing with legs touching table and then without Partial sit ups 6" toe touches Cone touches working on weight shifts, body turns and reaching for balance 4" step ups, close CGA and at times mod A due to LOB, needed a lot of redirecting to stay on task as he is easily distracted Ball kicks with back of knees touching the table Side stepping Walking without device.  Outside around [place] and to his car with wife following with  w/c  07/19/22:   Nustep L5 5 min, for tissue perfusion, endurance, coordination At steps in smaller room:  Standing for toe raises 10x Tandem standing forward foot on airex, with trunk rotation to each side, to focus on visual cue( waved to his wife) attempted to perform without UE support  Full turn R and L , x 2, with patient cued to place himself in front of mirror-mirror used for feedback to improve spatial awareness In ll bars  forward steps over airex balance beam  4 lengths, with 2 airex balance beams , cues to increase step length particularly on L Standing lateral step overs airex balance beam cues to place both feet over, patient tends to avoid placing second foot down  Seated for forward rolls with 65 cm therapeutic ball , with partial standing, 10 reps  Seated high marching with alternate arm punches, one 30 sec bout, patinet unable to really complete effectively Standing at steps with 21/2# cuff weight on each leg, for hip flex/ext and hip abd, 10 x each Gait without device 2 separate sets, 100'. Verbal cues to elongate step length B  07/15/22: Neuromuscular re education:   Nustep L5 5 min, for tissue perfusion, endurance, coordination At steps in smaller room:  Standing for toe raises 10x  Full turn R and L , x 2, with patient cued to place himself in front of mirror-mirror used for feedback to improve spatial awareness Standing for forward/back steps over airex balance beam 10x each leg, cues to increase step length particularly on L Standing lateral step overs  airex balance, cues to place both feet over, patient tends to avoid placing second foot down Seated for forward rolls with 65 cm therapeutic ball , with partial standing, 10 reps Standing for bouncing large ball back and forth with patient's wife, facing R and facing L  Seated high march overs with each leg, over cuff weight on floor Standing at steps with 21/2# cuff weight on each leg, for hip flex/ext and hip abd, 10 x  each Gait without device 2 separate sets, 100'. Verbal cues to elongate step length B  07/13/22 NuStep L5 x 5 minutes Seated forward weight shift, reaching 2# weight forward to touch RW, then moving sit to stand and reaching up. Required mod VC to lean forward and then to reach up with LUE. 8 reps. Standing lateral step out and back, required mod TC to weight shift onto LLE in abd, 10 each way Moved to quiet environment- Step back, rotate and reach behind to touch mat, 5 x on each side. Stood and reached up to above shoulder height to pick up object with L hand, walk back across 9' room to place on mat. Repeat x 4, using LUE to reach, CGA, no unsteadiness. Gait training with VC for longer step length, CGA, no AD, 2 x 100', including tihgt spaces, stepping back, turning.  07/07/22  Education  PATIENT EDUCATION: Education details: POC Person educated: Patient and Spouse Education method: Explanation Education comprehension: verbalized understanding  HOME EXERCISE PROGRAM: TBD  GOALS: Goals reviewed with patient? Yes  SHORT TERM GOALS: Target date: 07/19/22  I with initial HEP Baseline: Goal status: ongoing  2. Decrease 5 x STS to < 25 sec to demonstrate improved functional strength  Baseline: 38 sec  Goal status: 07/28/22-ongoing  3. Decrease TUG to < 35 sec to demonstrate improved functional balance  Baseline: 47 sec  Goal status: met 07/21/22  4. Perform sit <> stand transfers from average chair height with S and VC to be able to use the swivel chair at his dining table.  Baseline: Min A  Goal status: INITIAL  LONG TERM GOALS: Target date: 09/29/22  Patient will be able to step over his tub at home using grab bars for stability with CGA to access his shower. Baseline: Unable- has bench, but wife reports he steps over then sits. Goal status: INITIAL  2.  Decrease 5 x STS to < 18 sec to demonstrate improved functional strength Baseline:  Goal status: INITIAL  3.  Decrease  TUG to < 25 sec to demonstrate improved functional balance Baseline:  Goal status: 07/28/22-ongoing  4.  Patient will perform his bed mobility with MI. Baseline: Min A Goal status: INITIAL  5.  Patient will ambulate at least 400' with LRAD, CGA, demonstrate improved upright posture, longer step length. Baseline:  Goal status: INITIAL  6.  Patient will manage up and down a single step with LRAD, CGA to access his deck at home. Baseline: Unable Goal status: INITIAL  ASSESSMENT:  CLINICAL IMPRESSION: Patient reports no falls. Contiinued to emphasize balance, larger movements, impeded at times due to distractibility- Moved into quiet room with slightly better attention. Good participation in spite of his distractibility.  OBJECTIVE IMPAIRMENTS: Abnormal gait, decreased activity tolerance, decreased balance, decreased cognition, decreased coordination, decreased endurance, decreased mobility, difficulty walking, decreased ROM, decreased strength, decreased safety awareness, impaired flexibility, impaired UE functional use, improper body mechanics, and postural dysfunction.   ACTIVITY LIMITATIONS: carrying, lifting, bending, sitting, standing, squatting, stairs, transfers, bed mobility, bathing, toileting,  dressing, hygiene/grooming, and locomotion level  PARTICIPATION LIMITATIONS: meal prep, cleaning, laundry, interpersonal relationship, shopping, and community activity  PERSONAL FACTORS: Age, Past/current experiences, and 3+ comorbidities: CVA, Parkinson's traits, dementia  are also affecting patient's functional outcome.   REHAB POTENTIAL: Good  CLINICAL DECISION MAKING: Evolving/moderate complexity  EVALUATION COMPLEXITY: Moderate  PLAN:  PT FREQUENCY: 2x/week  PT DURATION: 12 weeks  PLANNED INTERVENTIONS: Therapeutic exercises, Therapeutic activity, Neuromuscular re-education, Balance training, Gait training, Patient/Family education, Self Care, Joint mobilization, Stair  training, Moist heat, and Manual therapy  PLAN FOR NEXT SESSION: Trunk and extremity mobilization, balance, strength, functional mobility re-education.   Ethel Rana DPT 07/28/22 4:42 PM

## 2022-07-29 NOTE — Therapy (Signed)
OUTPATIENT OCCUPATIONAL THERAPY TREATMENT NOTE  Patient Name: William [name] MRN: [id number] DOB:09/03/1940, 82 y.o., male Today's Date: 08/02/2022  PCP & REFERRING PROVIDER:  Carollee Herter, Alferd Apa, DO    END OF SESSION:  OT End of Session - 08/02/22 1553     Visit Number 4    Number of Visits 12    Date for OT Re-Evaluation 08/20/22    Authorization Type [employer]    OT Start Time [id number]    OT Stop Time 1637    OT Time Calculation (min) 44 min    Activity Tolerance Patient tolerated treatment well;Patient limited by fatigue;Patient limited by lethargy;Other (comment);No increased pain   Poor cognition and complex presentation   Behavior During Therapy Impulsive              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   HTN (hypertension) 07/19/2015   Hyperlipidemia    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   Mild, related to stroke history   Nocturia    Renal insufficiency 06/25/2013   S/P carotid endarterectomy    BILATERAL ICA--  PATENT PER DUPLEX  05-19-2012   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/26/2017   Right calf   Urinary frequency    Vitamin D deficiency    Past Surgical History:  Procedure Laterality Date   APPENDECTOMY  AS CHILD   CARDIOVASCULAR STRESS TEST  03-27-2012  DR CRENSHAW   LOW RISK LEXISCAN STUDY-- PROBABLE NORMAL PERFUSION AND SOFT TISSUE ATTENUATION/  NO ISCHEMIA/ EF 51%   CAROTID ENDARTERECTOMY Bilateral LEFT  11-12-2008  DR GREG HAYES   RIGHT ICA  2006  ([facility])   CHOLECYSTECTOMY N/A 02/23/2022   Procedure:  LAPAROSCOPIC CHOLECYSTECTOMY;  Surgeon: Felicie Morn, MD;  Location: [place];  Service: General;  Laterality: N/A;   CYSTOSCOPY W/ RETROGRADES Bilateral 06/22/2021   Procedure: CYSTOSCOPY WITH RETROGRADE PYELOGRAM;  Surgeon: Franchot Gallo, MD;  Location: [place];  Service: Urology;  Laterality: Bilateral;   CYSTOSCOPY WITH LITHOLAPAXY N/A 02/26/2013   Procedure: CYSTOSCOPY WITH LITHOLAPAXY;  Surgeon: Franchot Gallo, MD;  Location: [facility];  Service: Urology;  Laterality: N/A;   ENDOSCOPIC RETROGRADE CHOLANGIOPANCREATOGRAPHY (ERCP) WITH PROPOFOL N/A 02/22/2022   Procedure: ENDOSCOPIC RETROGRADE CHOLANGIOPANCREATOGRAPHY (ERCP) WITH PROPOFOL;  Surgeon: Carol Ada, MD;  Location: [place];  Service: Gastroenterology;  Laterality: N/A;   EYE SURGERY  Jan. 2016   cataract surgery both eyes   INGUINAL HERNIA REPAIR Right 11-08-2006   IR KYPHO EA ADDL LEVEL THORACIC OR LUMBAR  02/12/2021   IR RADIOLOGIST EVAL & MGMT  02/18/2021   MASS EXCISION N/A 03/03/2016   Procedure: EXCISION OF BACK  MASS;  Surgeon: Stark Klein, MD;  Location: [place];  Service: General;  Laterality: N/A;   MOHS SURGERY Left 1/ 2016   Dr Nevada Crane-- Basal cell   PROSTATE SURGERY     REMOVAL OF STONES  02/22/2022   Procedure: REMOVAL OF STONES;  Surgeon: Carol Ada, MD;  Location: [facility]  ENDOSCOPY;  Service: Gastroenterology;;   [name]  02/22/2022   Procedure: [name];  Surgeon: Carol Ada, MD;  Location: [facility] ENDOSCOPY;  Service: Gastroenterology;;   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-C N/A 06/22/2021   Procedure: TRANSURETHRAL RESECTION OF BLADDER TUMOR;  Surgeon: Franchot Gallo, MD;  Location: [facility];  Service: Urology;  Laterality: N/A;   TRANSURETHRAL RESECTION OF PROSTATE N/A 02/26/2013   Procedure: TRANSURETHRAL RESECTION OF THE PROSTATE WITH GYRUS INSTRUMENTS;  Surgeon: Franchot Gallo, MD;  Location:  [facility];  Service: Urology;  Laterality: N/A;   TRANSURETHRAL RESECTION OF PROSTATE N/A 06/22/2021   Procedure: TRANSURETHRAL RESECTION OF THE PROSTATE (TURP);  Surgeon: Franchot Gallo, MD;  Location: [facility];  Service: Urology;  Laterality: N/A;   Patient Active Problem List   Diagnosis Date Noted   Nonintractable headache 07/01/2022   Bilateral impacted cerumen 06/24/2022   Rash 06/15/2022   Postoperative ileus ([place]) 02/27/2022   Ileus, postoperative ([place]) 02/26/2022   Choledocholithiasis 02/19/2022   DNR (do not resuscitate) 02/19/2022   Left elbow pain 01/26/2022   Mid back pain on left side 01/26/2022   Rib pain 01/26/2022   Acute pain of left shoulder 11/12/2021   Leukocytes in urine 11/12/2021   Urinary frequency 11/12/2021   Thrush 10/08/2021   Hemiplegia, dominant side S/P CVA (cerebrovascular accident) ([place]) 09/11/2021   Insomnia    Prediabetes    Acute 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 ([place])    Basal ganglia infarction ([place]) 07/29/2021   Transaminitis 07/27/2021   UTI (urinary tract infection) 07/27/2021   CVA (cerebral vascular accident) ([place]) 07/27/2021   Fall 07/27/2021   Hyperglycemia 07/27/2021   Cholelithiasis 07/27/2021   Hypoxia 07/27/2021   Nausea and vomiting [id number]   Acute metabolic encephalopathy [id number]   Normocytic anemia 07/27/2021   Chronic back pain 07/27/2021   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[place]) 06/22/2021   Closed fracture of first lumbar vertebra with routine healing 02/03/2021   Closed fracture of multiple ribs 11/18/2020   Anxiety 01/29/2020   Leg pain, bilateral 01/29/2020   Ingrown toenail 07/13/2019   Lumbar spondylosis 05/02/2018   Pain in left knee 03/09/2018   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   Trochanteric bursitis of left hip 01/16/2018   Preventative health care 09/26/2017   HTN (hypertension) 07/19/2015   Hyperlipidemia  07/19/2015   Great toe pain 02/11/2014   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   Obesity (BMI 30-39.9) 06/25/2013   Renal insufficiency 06/25/2013   Weakness of left arm 06/25/2013   Sebaceous cyst 03/03/2011   Sprain of lumbar region 07/31/2010   Rib pain, left 08/29/2009   Carotid artery stenosis, asymptomatic, bilateral 05/02/2009   Eczema, atopic 05/31/2008   Vitamin D deficiency 03/01/2008   BPH (benign prostatic hyperplasia) 08/06/2007   Fasting hyperglycemia 12/21/2006   History of right MCA infarct 2006    ONSET DATE: Acute exacerbation of chronic symptoms (most recent hospitalization 02/26/22 - 03/12/22, followed by SNF and home health)   REFERRING DIAG:  I63.9 (ICD-10-CM) - Cerebrovascular accident (CVA), unspecified mechanism ([place])  W19.XXXD (ICD-10-CM) - Fall, subsequent encounter    THERAPY DIAG:  Other lack of coordination  Muscle weakness (generalized)  Hemiplegia and hemiparesis following cerebral infarction affecting left non-dominant side (HCC)  Acute pain of left shoulder  Attention and concentration deficit  Rationale for Evaluation and Treatment: Rehabilitation  PERTINENT HISTORY: PMHx includes L basal ganglia and corona radiata infarct 07/27/21, L  lower homonymous quadrantanopia; R MCA infarct in 2006 w/ L-sided hemiparesis, NCD, carotid artery disease (bilateral), HTN, HLD,CKD3, vascular dementia, and hx of bladder cancer. He has had falls in 2023, fracturing his clavicle in August, then he had stomach surgery in Sept 2023. He has He needs assist at baseline for ADLs. When last documented in acute rehab after having his gall-bladder removed, (03/09/22) he required moderate to maximum assist x2 for most ADLs (more assist for ADLs the require standing), and they recommended rehab in skilled nursing setting. He apparently got Covid at some point and also attended a SNF. Once he D/C home, he had some episodes of combativeness at night  with  "sun-downing."  He is communicative and appropriate, though appears easily confused and distracted, not fully oriented to time and place (Ax2, states date is 61 and cannot say what city he lives in). His wife provides most details, and he is participative, though a poor historian at times. In a nut shell- he has been suffering from dementia and post-stroke effects for years, and his issues were recently exacerbated in Sept of last year after abdominal surgery. He has less use of Lt "stroke side" and new, ritualistic "ticks" in mouth and Rt hand/arm.  Wife states that they do have a caregiver come M, W, F for 4 hours in the middle of the day to mainly help him bathe and dress, which usually tires him out. He has been more combative towards the end of the day as well.   PRECAUTIONS: Fall and Other: Lt hemi, poor cognition, LATEX and adhesive allergies, etc.  ;WEIGHT BEARING RESTRICTIONS: No   SUBJECTIVE:   SUBJECTIVE STATEMENT: He states doing well, and she states nothing much is new.   Pt accompanied by:  his wife  PAIN:  Are you having pain? Not now at rest Rating: 0/10 at rest now   FALLS: Has patient fallen in last 6 months? Yes. Number of falls at least 1 resulting in clavicle fx (now healed)   LIVING ENVIRONMENT: Lives with: lives with their spouse Lives in: House/apartment Has following equipment at home: manual w/c, 2 w/w, weighted utensils, shower bench (for tub), bed cane, hospital bed, raised toilet, and possibly more  PLOF: Needs assistance with ADLs, Needs assistance with homemaking, Needs assistance with gait, and Needs assistance with transfers  PATIENT GOALS: Top caregiver goals today:  #1- increase personal hygiene skills (brush teeth, shaving) #2- prepare instant coffee or microwave meal #3- improve functional cognitive skills to play games, etc.,  #4- increase Lt arm usage #5- try to help decrease Rt hand/arm perseveration (flicking, tapping, noise making) #6-  Improve fnl tranfers in/out of multiple seats (tub bench, swivel chair, etc.)    OBJECTIVE: (All objective assessments below are from initial evaluation on: 07/07/22 unless otherwise specified.)   HAND DOMINANCE: Right  ADLs: Overall ADLs: Wife states decreased ability since hospitalization in Sept 23.  Transfers/ambulation related to ADLs: Min A transfer, CGA for mobility Eating: set up Grooming: Mod A- needs assist with oral care, shaving, but can comb his hair UB Dressing: Min A  LB Dressing: Dep  Toileting:  Mod  Bathing: Mod  Tub Shower transfers: Min A  Equipment: Radio broadcast assistant, Grab bars, and Feeding equipment  IADLs: completely dependent for years now, except some ability to use microwave which is now not possible after hospitalization   MOBILITY STATUS: Needs Assist: CGA - Min A, Hx of falls, Festination, difficulty with turns, and neglect on Lt side  POSTURE COMMENTS:  rounded shoulders, forward head, and weight shift left  ACTIVITY TOLERANCE: Activity tolerance: he became tired and somewhat agitated by the end of the OT evaluation today (after PT and OT about 2.5hours with mostly sedate activities in seated positions)  FUNCTIONAL OUTCOME MEASURES: [facility] 17/30  showing problems with visuospatial/executive function, attention, fluency, and orientation. (at least 26/30 is needed to be considered "normal")  [employer] in ADLs: 1/6  (very dependent)  UPPER EXTREMITY ROM:    Active ROM Right eval Left eval  Shoulder scaption 100 85  Shoulder abduction    Shoulder adduction    Shoulder extension    Shoulder internal rotation Reaches small of back Reaches to hip  Shoulder external rotation Reaches back of head  Reaches up to ear  Elbow flexion 150 130  Elbow extension (-10*)  (-35*)  Wrist flexion 35 20  Wrist extension 50 10  (Blank rows = not tested)  UPPER EXTREMITY MMT:    Tested grossly as "push" and "pull": UE push 4-/5, UE pull 4/5  HAND  FUNCTION: Grip strength: Right: 30 lbs; Left: 20 lbs  COORDINATION: Finger Nose Finger test: mildly ataxic but fairly accurate  Box and Blocks:  Right 11blocks, Left 5blocks  MUSCLE TONE:  Rt- normal, Lt- decreased and worsened by inattention/disuse  COGNITION: Overall cognitive status: Impaired and Difficulty to assess due to: severity of deficits; Poor attention, fluency, orientation, and executive function. Recall is fairly good as well as abstraction.  Seems to have personality changes due to vascular dementia- like an OCD type volitional perseveration of tapping with Rt hand that he can somewhat control.  He did become angry and agitated at the end of the session performing car transfer with spouse: he did shout at her.  VISION: Subjective report: no new changes per pt/spouse, uses "cheaters"   VISION ASSESSMENT: Visual fields seem intact though he had latent responses on the left side showing likely inattention/and/neglect, tracking somewhat delayed horizontally and vertically  PERCEPTION: Impaired: Inattention/neglect: does not attend to left visual field and does not attend to left side of body, Body scheme: poor quality of recognition with shaving, etc., and Spatial orientation: decreased in print and in orientation, especially to Lt side  PRAXIS: Not tested specifically but no overt/stated issues using pen, wearing glasses ,etc.   OBSERVATIONS: Pt taps and flicks and does seem to volitionally perseverate on making a rhythm/noise with Rt hand. He states he is aware of it and chooses to do it.  He does also seem to have mild resting and intention tremor.    TODAY'S TREATMENT:                                                                                                                              DATE:  08/02/22: He starts by trying to reach overhead for shoulder range of motion, but the left shoulder is stiff and tight.  He states having some discomfort in  the left shoulder, so  OT review shoulder stretches with him and his spouse.  He does this is seated reaching across a table for flexion stretch also in abduction and external rotation.  These are a review for them.  Next to increase use of the left side, OT positions him with a table to his left and he performs multiple functional activities with pegs, games like next 4, reaching out to the left.  His right hand is needed to be held down during this at times.  Next he performs other bimanual task or forced use of his left hand for example he uses his left hand to wash his right hand as he cannot do that with the right.  OT uses mirrors that time and verbal and physical cues to give feedback to him on his performance and educates that sometimes verbal cues can be considered aggressive by the person and the silent physical cues might be tolerated better when the person has anxiety.    07/28/22: Today occupational therapy focused on their personal goals of: Functional transfers, functional cognition to play game, microwave usage, and increased left arm usage.  He starts by performing sit to stand and walking to the kitchen area and having seated at a table.  OT then facilitates the process of him reading and attempting to comprehend the directions on a packet of oatmeal, collecting the necessary ingredients safely with functional mobility, safe use of microwave and disposal of trash and cleaning up.  He did have some safety concerns with selecting the incorrect time on the microwave and risk of potential burn with hot bowl that required intervention from the OT.  Otherwise he needed cues for his memory for some of the steps of the process and close supervision for functional mobility.  Next he walks to the therapy room and performs a seated game activity, with bimanual tasks and shuffling cards, cognitive tasks for following directions and taking turns, left hand forced use by OT holding his right hand.  OT also attempted to place  objects on the left side of his body to improve awareness of the left side.  At the end of the session OT helps him perform another functional transfer and get into his vehicle with contact-guard assist.   07/21/22: OT provides caregiver education for adapting home environments to increase safety or awareness for the patient.  This includes things like putting alarming or alerting devices to his left side to increase awareness of the left side, removing clutter and distractions and simplifying the environment, having orienting materials posted with an eye shot, and others.  His caregiver does state doing some of these already.  Additionally she was given paperwork on strategies for specific functional activities like hygiene and shaving and she does state getting him an Copy and it has helped somewhat.  She states that he is better at shaving with it.  They are also given the name of a neuropsychologist who specializes in dementia with him they could get a consultation or second opinion if they desired.  Next to improve the use of the left arm, OT gives him the following home exercise program for bilateral arm use on a wand or cane or dowel rod.  He needs verbal and physical cues as he neglects his left side significantly still.  OT also utilizes certain visual problems or targets to reach towards as his effort and his attention quickly wanes during these exercises.  When cued to "tap the wall" he  reaches out or performs better versus "moves side-to-side."  These type of targeting strategies were emphasized to his caregiver and she sees the benefit and states understanding.  Exercises - Seated Shoulder Flexion AAROM with Dowel  - 4-6 x daily - 1 sets - 10-15 reps - Seated Shoulder Abduction AAROM with Dowel  - 4-6 x daily - 1 sets - 10-15 reps - Seated Shoulder External Rotation AAROM with Dowel  - 4-6 x daily - 1 sets - 10-15 reps - Seated Bicep Curls with Bar  - 4-6 x daily - 1 sets - 10-15  reps - Seated Shoulder Row with Anchored Resistance  - 4-6 x daily - 1 sets - 10-15 reps   PATIENT EDUCATION: Education details: See treatment section above for details Person educated: Patient and Spouse Education method: Explanation, Demonstration, Tactile cues, and Verbal cues Education comprehension: verbalized understanding, returned demonstration, verbal cues required, tactile cues required, and needs further education  HOME EXERCISE PROGRAM: Access Code: [email or web address] URL: [email or web address] Date: 07/21/2022 Prepared by: Benito Mccreedy    GOALS: Goals reviewed with patient? Yes   SHORT TERM GOALS: (STG required if POC>30 days) Target Date: 07/23/22   Pt will demo/state understanding of initial HEP (or home activity suggestions to maintain function as appropriate) to improve functional skills. Goal status: 07/28/22- Met (addressed in past 2 sessions)   LONG TERM GOALS: Target Date: 08/20/22  Pt will improve functional ability by decreased impairment per Redmond Baseman assessment from 1/6 to 2/6 or better, for better quality of life. Goal status: INITIAL  2.  Caregiver will state his grooming ability/hygiene ability improved from moderate assist to supervision assist. Goal status: INITIAL  3.  He will show better functional use of left arm by improved box and blocks Test score from 5 blocks to at least 10 blocks. Goal status: INITIAL  4.  Pt will show ability to microwave instant coffee or small meal safely, to help decrease caregiver burden. Goal status: INITIAL  5.  He will show improved cognitive ability enough to play a simple game with OT and caregiver. Goal status: INITIAL  6.  Caregiver will state at least 4 ways to decrease perseveration with right arm, also stating that this behaviors at least somewhat better in the home now. Goal status: INITIAL   ASSESSMENT:  CLINICAL IMPRESSION: 08/02/22: He continues to work on left hand use and OT  continues to educate spouse on techniques to make life a little easier and incorporate their goals.   07/28/22: OT is addressing all of their goals, and these things should be encouraged in similar ways at home.  His wife is very helpful and already doing so much but she could facilitate awareness of the left side in different and new ways, and she was reminded of this, but it has to be a functional fit for their lives.  New learning at this point is likely very difficult or impossible so repetition and environmental cues are stressed.  Continue plan of care.  07/21/22: Personal hygiene skills/ADLs were addressed today as well as increasing use of the left arm.  Continue plan of care addressing their other goals  Eval: Patient is a 82 y.o. male who was seen today for occupational therapy evaluation for multiple and complex problems including vascular dementia, new ritualistic behaviors, decreased functional ability, left hemiparesis and worsening left arm function, and more.  He will benefit from occupational therapy to increase functional ability and quality of life, also to help decrease caregiver burden somewhat.[name]  PLAN:  OT FREQUENCY: 2x/week  OT DURATION: 6 weeks (through 08/20/22 as needed)   PLANNED INTERVENTIONS: self care/ADL training, therapeutic exercise, therapeutic activity, neuromuscular re-education, manual therapy, balance training, functional mobility training, moist heat, cryotherapy, patient/family education, cognitive remediation/compensation, visual/perceptual remediation/compensation, psychosocial skills training, energy conservation, coping strategies training, DME and/or AE instructions, and Re-evaluation  RECOMMENDED OTHER SERVICES: He has already seen PT for balance and functional mobility, he would probably benefit from speech for cognitive issues as well, though OT will be working with cognition somewhat functionally now.  CONSULTED AND AGREED WITH PLAN OF CARE: Patient  and family member/caregiver  PLAN FOR NEXT SESSION:  Continue working towards their personal goals of: PATIENT GOALS: Top caregiver goals today:  #1- increase personal hygiene skills (brush teeth, shaving) #2- prepare instant coffee or microwave meal #3- improve functional cognitive skills to play games, etc.,  #4- increase Lt arm usage #5- try to help decrease Rt hand/arm perseveration (flicking, tapping, noise making) #6- Improve fnl tranfers in/out of multiple seats (tub bench, swivel chair, etc.)   Benito Mccreedy, OTR/L, CHT 08/02/2022, 4:48 PM

## 2022-07-31 DIAGNOSIS — G8929 Other chronic pain: Secondary | ICD-10-CM | POA: Diagnosis not present

## 2022-07-31 DIAGNOSIS — I69359 Hemiplegia and hemiparesis following cerebral infarction affecting unspecified side: Secondary | ICD-10-CM | POA: Diagnosis not present

## 2022-07-31 DIAGNOSIS — S32019D Unspecified fracture of first lumbar vertebra, subsequent encounter for fracture with routine healing: Secondary | ICD-10-CM | POA: Diagnosis not present

## 2022-07-31 DIAGNOSIS — R0902 Hypoxemia: Secondary | ICD-10-CM | POA: Diagnosis not present

## 2022-07-31 DIAGNOSIS — M5430 Sciatica, unspecified side: Secondary | ICD-10-CM | POA: Diagnosis not present

## 2022-08-02 ENCOUNTER — Encounter: Payer: Self-pay | Admitting: Physical Therapy

## 2022-08-02 ENCOUNTER — Ambulatory Visit: Payer: PPO | Admitting: Physical Therapy

## 2022-08-02 ENCOUNTER — Ambulatory Visit: Payer: PPO | Admitting: Rehabilitative and Restorative Service Providers"

## 2022-08-02 ENCOUNTER — Encounter: Payer: Self-pay | Admitting: Rehabilitative and Restorative Service Providers"

## 2022-08-02 DIAGNOSIS — N1832 Chronic kidney disease, stage 3b: Secondary | ICD-10-CM | POA: Diagnosis not present

## 2022-08-02 DIAGNOSIS — I69354 Hemiplegia and hemiparesis following cerebral infarction affecting left non-dominant side: Secondary | ICD-10-CM

## 2022-08-02 DIAGNOSIS — M6281 Muscle weakness (generalized): Secondary | ICD-10-CM

## 2022-08-02 DIAGNOSIS — R41840 Attention and concentration deficit: Secondary | ICD-10-CM

## 2022-08-02 DIAGNOSIS — R262 Difficulty in walking, not elsewhere classified: Secondary | ICD-10-CM

## 2022-08-02 DIAGNOSIS — Z9181 History of falling: Secondary | ICD-10-CM

## 2022-08-02 DIAGNOSIS — R278 Other lack of coordination: Secondary | ICD-10-CM

## 2022-08-02 DIAGNOSIS — M25512 Pain in left shoulder: Secondary | ICD-10-CM

## 2022-08-02 DIAGNOSIS — I10 Essential (primary) hypertension: Secondary | ICD-10-CM | POA: Diagnosis not present

## 2022-08-02 DIAGNOSIS — R296 Repeated falls: Secondary | ICD-10-CM

## 2022-08-02 NOTE — Therapy (Signed)
OUTPATIENT PHYSICAL THERAPY NEURO Treatment   Patient Name: William [name] MRN: [id number] DOB:10/05/1940, 82 y.o., male Today's Date: 08/02/2022  PCP: Lowne Chase, [place] PROVIDER: same  END OF SESSION:  PT End of Session - 08/02/22 1503     Visit Number 8    Date for PT Re-Evaluation 09/29/22    PT Start Time 1558    PT Stop Time 1640    PT Time Calculation (min) 42 min    Equipment Utilized During Treatment Gait belt    Activity Tolerance Patient tolerated treatment well    Behavior During Therapy Impulsive             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   HTN (hypertension) 07/19/2015   Hyperlipidemia    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   Mild, related to stroke history   Nocturia    Renal insufficiency 06/25/2013   S/P carotid endarterectomy    BILATERAL ICA--  PATENT PER DUPLEX  05-19-2012   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/26/2017   Right calf   Urinary frequency    Vitamin D deficiency    Past Surgical History:  Procedure Laterality Date   APPENDECTOMY  AS CHILD   CARDIOVASCULAR STRESS TEST  03-27-2012  DR CRENSHAW   LOW RISK LEXISCAN STUDY-- PROBABLE NORMAL PERFUSION AND SOFT TISSUE ATTENUATION/  NO ISCHEMIA/ EF 51%   CAROTID ENDARTERECTOMY Bilateral LEFT  11-12-2008  DR GREG HAYES   RIGHT ICA  2006  ([facility])   CHOLECYSTECTOMY N/A 02/23/2022   Procedure: LAPAROSCOPIC CHOLECYSTECTOMY;  Surgeon: Felicie Morn, MD;  Location: [place];  Service: General;  Laterality: N/A;   CYSTOSCOPY W/ RETROGRADES  Bilateral 06/22/2021   Procedure: CYSTOSCOPY WITH RETROGRADE PYELOGRAM;  Surgeon: Franchot Gallo, MD;  Location: [place];  Service: Urology;  Laterality: Bilateral;   CYSTOSCOPY WITH LITHOLAPAXY N/A 02/26/2013   Procedure: CYSTOSCOPY WITH LITHOLAPAXY;  Surgeon: Franchot Gallo, MD;  Location: [facility];  Service: Urology;  Laterality: N/A;   ENDOSCOPIC RETROGRADE CHOLANGIOPANCREATOGRAPHY (ERCP) WITH PROPOFOL N/A 02/22/2022   Procedure: ENDOSCOPIC RETROGRADE CHOLANGIOPANCREATOGRAPHY (ERCP) WITH PROPOFOL;  Surgeon: Carol Ada, MD;  Location: [place];  Service: Gastroenterology;  Laterality: N/A;   EYE SURGERY  Jan. 2016   cataract surgery both eyes   INGUINAL HERNIA REPAIR Right 11-08-2006   IR KYPHO EA ADDL LEVEL THORACIC OR LUMBAR  02/12/2021   IR RADIOLOGIST EVAL & MGMT  02/18/2021   MASS EXCISION N/A 03/03/2016   Procedure: EXCISION OF BACK  MASS;  Surgeon: Stark Klein, MD;  Location: [place];  Service: General;  Laterality: N/A;   MOHS SURGERY Left 1/ 2016   Dr Nevada Crane-- Basal cell   PROSTATE SURGERY     REMOVAL OF STONES  02/22/2022   Procedure: REMOVAL OF STONES;  Surgeon: Carol Ada, MD;  Location: [place];  Service: Gastroenterology;;   [name]  02/22/2022   Procedure: [name];  Surgeon: Carol Ada, MD;  Location: [place];  Service: Gastroenterology;;   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-C N/A 06/22/2021   Procedure: TRANSURETHRAL RESECTION OF BLADDER TUMOR;  Surgeon: Franchot Gallo, MD;  Location: [facility];  Service: Urology;  Laterality: N/A;   TRANSURETHRAL RESECTION OF PROSTATE N/A 02/26/2013   Procedure: TRANSURETHRAL RESECTION OF THE PROSTATE WITH GYRUS INSTRUMENTS;  Surgeon: Franchot Gallo, MD;  Location: [facility];  Service: Urology;  Laterality: N/A;   TRANSURETHRAL RESECTION OF PROSTATE N/A 06/22/2021   Procedure: TRANSURETHRAL  RESECTION OF THE PROSTATE (TURP);  Surgeon: Franchot Gallo, MD;  Location: [facility];  Service: Urology;  Laterality: N/A;   Patient Active Problem List   Diagnosis Date Noted   Nonintractable headache 07/01/2022   Bilateral impacted cerumen 06/24/2022   Rash 06/15/2022   Postoperative ileus ([place]) 02/27/2022   Ileus, postoperative ([place]) 02/26/2022   Choledocholithiasis 02/19/2022   DNR (do not resuscitate) 02/19/2022   Left elbow pain 01/26/2022   Mid back pain on left side 01/26/2022   Rib pain 01/26/2022   Acute pain of left shoulder 11/12/2021   Leukocytes in urine 11/12/2021   Urinary frequency 11/12/2021   Thrush 10/08/2021   Hemiplegia, dominant side S/P CVA (cerebrovascular accident) ([place]) 09/11/2021   Insomnia    Prediabetes    Acute 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 ([place])    Basal ganglia infarction ([place]) 07/29/2021   Transaminitis 07/27/2021   UTI (urinary tract infection) 07/27/2021   CVA (cerebral vascular accident) ([place]) 07/27/2021   Fall 07/27/2021   Hyperglycemia 07/27/2021   Cholelithiasis 07/27/2021   Hypoxia 07/27/2021   Nausea and vomiting [id number]   Acute metabolic encephalopathy [id number]   Normocytic anemia 07/27/2021   Chronic back pain 07/27/2021   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[place]) 06/22/2021   Closed fracture of first lumbar vertebra with routine healing 02/03/2021   Closed fracture of multiple ribs 11/18/2020   Anxiety 01/29/2020   Leg pain, bilateral 01/29/2020   Ingrown toenail 07/13/2019   Lumbar spondylosis 05/02/2018   Pain in left knee 03/09/2018   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   Trochanteric bursitis of left hip 01/16/2018   Preventative health care 09/26/2017   HTN (hypertension) 07/19/2015   Hyperlipidemia 07/19/2015   Great toe pain 02/11/2014   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   Obesity (BMI 30-39.9) 06/25/2013   Renal insufficiency  06/25/2013   Weakness of left arm 06/25/2013   Sebaceous cyst 03/03/2011   Sprain of lumbar region 07/31/2010   Rib pain, left 08/29/2009   Carotid artery stenosis, asymptomatic, bilateral 05/02/2009   Eczema, atopic 05/31/2008   Vitamin D deficiency 03/01/2008   BPH (benign prostatic hyperplasia) 08/06/2007   Fasting hyperglycemia 12/21/2006   History of right MCA infarct 2006    ONSET DATE: 06/29/22  REFERRING DIAG:  Diagnosis  I63.9 (ICD-10-CM) - Cerebrovascular accident (CVA), unspecified mechanism ([place])  W19.XXXD (ICD-10-CM) - Fall, subsequent encounter    THERAPY DIAG:  Other lack of coordination  Muscle weakness (generalized)  Hemiplegia and hemiparesis following cerebral infarction affecting left non-dominant side (HCC)  History of falling  Difficulty in walking, not elsewhere classified  Repeated falls  Attention and concentration deficit  Rationale for Evaluation and Treatment: Rehabilitation  SUBJECTIVE:  SUBJECTIVE STATEMENT: No falls, doing well.  PERTINENT HISTORY:  William [name] is a 82 year old man with dementia, CKD, HTN, CAD, HLD and history of CVA  (2006, 2021), Parkinson's  PAIN:  Are you having pain? No  PRECAUTIONS: Fall  WEIGHT BEARING RESTRICTIONS: No  FALLS: Has patient fallen in last 6 months? No  LIVING ENVIRONMENT: Lives with: lives with their family and lives with their spouse Lives in: House/apartment Stairs:  1 brick high step Has following equipment at home: Environmental consultant - 2 wheeled, Wheelchair (manual), Shower bench, bed side commode, Grab bars, and hospital bed, sliding pad for car  PLOF: Independent with basic ADLs and Independent with household mobility without device  PATIENT GOALS: Walk around the block with his wife. Be able to use the  dining room chair rather than have to use the W/C. Increased I with bed mobility and simple tasks at home like make some coffee or brush his teeth, Step over tub to shower, has bench, but he can't really use it. Up and down the one step to get to back deck.  OBJECTIVE:   DIAGNOSTIC FINDINGS:  MRI 2021 with degenerative changes in lumbar spine, L3-4 R subarticular stenosis  COGNITION: Overall cognitive status: History of cognitive impairments - at baseline   SENSATION: Not tested  COORDINATION: Moderately impaired BLE   MUSCLE LENGTH: Hamstrings: severely restricted B Thomas test: Severely restricted B.  POSTURE: rounded shoulders, forward head, increased thoracic kyphosis, posterior pelvic tilt, and flexed trunk   LOWER EXTREMITY ROM:   BLE extremely stiff throughout, limited hip ROM in all planes, B knees limited in extension.   LOWER EXTREMITY MMT:  3+/5 throughout. Unable to determine accuracy of MMT due to cognition. Functionally demosntrates poor coordinated activation and poor muscular endurance.   BED MOBILITY: Min A for Supine<> Sit. Patient was using a ladder in his hospital bed and could move MI.  TRANSFERS: Min A, mod VC, tends to lean back.  CURB: Min A-Has one step out to his deck.  STAIRS: N/A   GAIT: Gait pattern: step to pattern, decreased arm swing- Right, decreased arm swing- Left, decreased step length- Right, decreased step length- Left, shuffling, festinating, trunk flexed, narrow BOS, poor foot clearance- Right, and poor foot clearance- Left Distance walked: 57' Assistive device utilized: Environmental consultant - 2 wheeled Level of assistance: Occasional min A Comments: mod VC to stay close to walker, take longer steps.  FUNCTIONAL TESTS:  5 times sit to stand: 38.23 Timed up and go (TUG): 47.91  with RW, 07/21/22 TUG 29 seconds without device  TODAY'S TREATMENT:                                                                                                                                DATE:  08/02/22 NuStep L5 x 6 minutes Supine rotational stretch to trunk, knees in hooklying, rotate to L, reach with L arm to R and back x 5,  repeat to the R. Roll to L side lie, reach with R arm into rotation forward and back, tried on L, but his L hip hurt in side lie. Seated trunk rotation to R, reach with L hand, sliding out on mat and back x 5, repeat to R. Seated forward flexion over physioball 5 x 3 sec holds Ambulation with 2# WATE bar held at chest level, emphasizing long steps, U UE support. 34' B side stepping in parallel bars, on airex plank with BUE support, 3 x each direction. Walking in figure 8 around 2 cones on the floor, facing 1 direction at all times to ensure changing directions, CGA, max VC, mildly unsteady. Alternating step taps on 2, 6" steps for balance.  07/28/22 NuStep L3 x 6 minutes. Stand with back to corner. Use BUE to lift red physioball and rotate to R to touch target on wall, return to cneter, then repeat to L. Walk with ball, reaching straight up to touch overhead target without LOB, CGA for both. Side to side step over 2" obstacle on the floor. He was able to move side to side, but was not able to follow the commands for safe performance. Step over air ex planks on the floor, using R, then L legs, BUE support on parallel bars. Stable Climbing up and down steps using step over step technique. Required cues to move L hand along with his feet. 2# weight in BUE, reach down to knee height to touch mat, turn to R and up to touch target on wall. 10 x in each direction. Good reaching above shoulder height, needed cues to engage L hand.  07/26/22 Nustep Level 5 x 6 minutes Sit to stand with various heights x 5 each a lot of verbal cues needed and at times some assist Volley ball sitting and standing, some ball kicking Gait without device 3x 150 feet with cues for step length and speed also distractions In Pbars side steps, big steps and  backward steps In pbars head and trunk rotations Sitting and standing reaching for cones iwht each hand Supine rolling to sides, feet on ball trunk rotation, bridges Bridges without ball Sitting trunk elongations with reaching and twisting Holding onto rail 12 inch toe touches  07/21/22 Nustep level 5 x 6 minutes TUG 29 seconds without device SBA Volleyball sitting, standing with legs touching table and then without Partial sit ups 6" toe touches Cone touches working on weight shifts, body turns and reaching for balance 4" step ups, close CGA and at times mod A due to LOB, needed a lot of redirecting to stay on task as he is easily distracted Ball kicks with back of knees touching the table Side stepping Walking without device.  Outside around [place] and to his car with wife following with w/c  07/19/22:   Nustep L5 5 min, for tissue perfusion, endurance, coordination At steps in smaller room:  Standing for toe raises 10x Tandem standing forward foot on airex, with trunk rotation to each side, to focus on visual cue( waved to his wife) attempted to perform without UE support  Full turn R and L , x 2, with patient cued to place himself in front of mirror-mirror used for feedback to improve spatial awareness In ll bars  forward steps over airex balance beam  4 lengths, with 2 airex balance beams , cues to increase step length particularly on L Standing lateral step overs airex balance beam cues to place both feet over, patient tends to  avoid placing second foot down  Seated for forward rolls with 65 cm therapeutic ball , with partial standing, 10 reps  Seated high marching with alternate arm punches, one 30 sec bout, patinet unable to really complete effectively Standing at steps with 21/2# cuff weight on each leg, for hip flex/ext and hip abd, 10 x each Gait without device 2 separate sets, 100'. Verbal cues to elongate step length B  07/15/22: Neuromuscular re education:   Nustep L5  5 min, for tissue perfusion, endurance, coordination At steps in smaller room:  Standing for toe raises 10x  Full turn R and L , x 2, with patient cued to place himself in front of mirror-mirror used for feedback to improve spatial awareness Standing for forward/back steps over airex balance beam 10x each leg, cues to increase step length particularly on L Standing lateral step overs airex balance, cues to place both feet over, patient tends to avoid placing second foot down Seated for forward rolls with 65 cm therapeutic ball , with partial standing, 10 reps Standing for bouncing large ball back and forth with patient's wife, facing R and facing L  Seated high march overs with each leg, over cuff weight on floor Standing at steps with 21/2# cuff weight on each leg, for hip flex/ext and hip abd, 10 x each Gait without device 2 separate sets, 100'. Verbal cues to elongate step length B  07/13/22 NuStep L5 x 5 minutes Seated forward weight shift, reaching 2# weight forward to touch RW, then moving sit to stand and reaching up. Required mod VC to lean forward and then to reach up with LUE. 8 reps. Standing lateral step out and back, required mod TC to weight shift onto LLE in abd, 10 each way Moved to quiet environment- Step back, rotate and reach behind to touch mat, 5 x on each side. Stood and reached up to above shoulder height to pick up object with L hand, walk back across 9' room to place on mat. Repeat x 4, using LUE to reach, CGA, no unsteadiness. Gait training with VC for longer step length, CGA, no AD, 2 x 100', including tihgt spaces, stepping back, turning.  07/07/22  Education  PATIENT EDUCATION: Education details: POC Person educated: Patient and Spouse Education method: Explanation Education comprehension: verbalized understanding  HOME EXERCISE PROGRAM: TBD  GOALS: Goals reviewed with patient? Yes  SHORT TERM GOALS: Target date: 07/19/22  I with initial  HEP Baseline: Goal status: ongoing  2. Decrease 5 x STS to < 25 sec to demonstrate improved functional strength  Baseline: 38 sec  Goal status: 07/28/22-ongoing  3. Decrease TUG to < 35 sec to demonstrate improved functional balance  Baseline: 47 sec  Goal status: met 07/21/22  4. Perform sit <> stand transfers from average chair height with S and VC to be able to use the swivel chair at his dining table.  Baseline: Min A  Goal status: 08/02/22-Inconsistently performs  with S from average height. At times requires CGA to cue forward weight shift. ongoing  LONG TERM GOALS: Target date: 09/29/22  Patient will be able to step over his tub at home using grab bars for stability with CGA to access his shower. Baseline: Unable- has bench, but wife reports he steps over then sits. Goal status: INITIAL  2.  Decrease 5 x STS to < 18 sec to demonstrate improved functional strength Baseline:  Goal status: INITIAL  3.  Decrease TUG to < 25 sec to demonstrate improved  functional balance Baseline:  Goal status: 07/28/22-ongoing  4.  Patient will perform his bed mobility with MI. Baseline: Min A Goal status: INITIAL  5.  Patient will ambulate at least 400' with LRAD, CGA, demonstrate improved upright posture, longer step length. Baseline:  Goal status: INITIAL  6.  Patient will manage up and down a single step with LRAD, CGA to access his deck at home. Baseline: Unable Goal status: INITIAL  ASSESSMENT:  CLINICAL IMPRESSION: Patient reports no falls. Treatment emphasized stretching to trunk, emphasizing rotation and extension to improve upright posture, then progressed with balance exercises. CGA and unsteady today.  OBJECTIVE IMPAIRMENTS: Abnormal gait, decreased activity tolerance, decreased balance, decreased cognition, decreased coordination, decreased endurance, decreased mobility, difficulty walking, decreased ROM, decreased strength, decreased safety awareness, impaired flexibility,  impaired UE functional use, improper body mechanics, and postural dysfunction.   ACTIVITY LIMITATIONS: carrying, lifting, bending, sitting, standing, squatting, stairs, transfers, bed mobility, bathing, toileting, dressing, hygiene/grooming, and locomotion level  PARTICIPATION LIMITATIONS: meal prep, cleaning, laundry, interpersonal relationship, shopping, and community activity  PERSONAL FACTORS: Age, Past/current experiences, and 3+ comorbidities: CVA, Parkinson's traits, dementia  are also affecting patient's functional outcome.   REHAB POTENTIAL: Good  CLINICAL DECISION MAKING: Evolving/moderate complexity  EVALUATION COMPLEXITY: Moderate  PLAN:  PT FREQUENCY: 2x/week  PT DURATION: 12 weeks  PLANNED INTERVENTIONS: Therapeutic exercises, Therapeutic activity, Neuromuscular re-education, Balance training, Gait training, Patient/Family education, Self Care, Joint mobilization, Stair training, Moist heat, and Manual therapy  PLAN FOR NEXT SESSION: Trunk and extremity mobilization, balance, strength, functional mobility re-education.   Ethel Rana DPT 08/02/22 3:47 PM

## 2022-08-03 ENCOUNTER — Other Ambulatory Visit (HOSPITAL_BASED_OUTPATIENT_CLINIC_OR_DEPARTMENT_OTHER): Payer: Self-pay

## 2022-08-03 ENCOUNTER — Ambulatory Visit (INDEPENDENT_AMBULATORY_CARE_PROVIDER_SITE_OTHER): Payer: PPO | Admitting: Family Medicine

## 2022-08-03 ENCOUNTER — Encounter: Payer: Self-pay | Admitting: Family Medicine

## 2022-08-03 VITALS — BP 122/72 | HR 82 | Temp 98.4°F | Resp 16 | Ht 71.0 in | Wt 170.0 lb

## 2022-08-03 DIAGNOSIS — R300 Dysuria: Secondary | ICD-10-CM

## 2022-08-03 LAB — POC URINALSYSI DIPSTICK (AUTOMATED)
Glucose, UA: NEGATIVE
Nitrite, UA: NEGATIVE
Protein, UA: NEGATIVE
Spec Grav, UA: 1.025 (ref 1.010–1.025)
Urobilinogen, UA: 0.2 E.U./dL
pH, UA: 6 — AB (ref 5.0–8.0)

## 2022-08-03 MED ORDER — CEPHALEXIN 500 MG PO CAPS
500.0000 mg | ORAL_CAPSULE | Freq: Two times a day (BID) | ORAL | 0 refills | Status: DC
  Filled 2022-08-03: qty 20, 10d supply, fill #0

## 2022-08-03 NOTE — Assessment & Plan Note (Signed)
Keflex x 10 days  Culture sent  Instructed pt to drink more water  Return to office as needed

## 2022-08-03 NOTE — Progress Notes (Addendum)
Subjective:   By signing my name below, I, [name], attest that this documentation has been prepared under the direction and in the presence of Ann Held, DO. 08/03/2022   Patient ID: William [name], male    DOB: 12-12-1940, 82 y.o.   MRN: [id number]  Chief Complaint  Patient presents with   Dysuria    Pt states having pain for a few days. Pt reports urine is cloudy.     Dysuria  Pertinent negatives include no frequency or nausea.   Patient is in today for a office visit.   He complains of burning while urinating and cloudy urine for the past couple of days. He also complains bladder pain. His wife reports he is not keeping up with his fluid intake at home.  His appetite has improved. His wife reports he is starting to eat more and has gained weight.  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   HTN (hypertension) 07/19/2015   Hyperlipidemia    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   Mild, related to stroke history   Nocturia    Renal insufficiency 06/25/2013   S/P carotid endarterectomy    BILATERAL ICA--  PATENT PER DUPLEX  05-19-2012   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   Right calf   Urinary frequency    Vitamin D deficiency     Past Surgical History:  Procedure Laterality Date   APPENDECTOMY  AS CHILD   CARDIOVASCULAR STRESS TEST  03-27-2012  DR CRENSHAW   LOW RISK LEXISCAN STUDY-- PROBABLE NORMAL  PERFUSION AND SOFT TISSUE ATTENUATION/  NO ISCHEMIA/ EF 51%   CAROTID ENDARTERECTOMY Bilateral LEFT  11-12-2008  DR GREG HAYES   RIGHT ICA  2006  ([facility])   CHOLECYSTECTOMY N/A 02/23/2022   Procedure: LAPAROSCOPIC CHOLECYSTECTOMY;  Surgeon: Felicie Morn, MD;  Location: [place];  Service: General;  Laterality: N/A;   CYSTOSCOPY W/ RETROGRADES Bilateral 06/22/2021   Procedure: CYSTOSCOPY WITH RETROGRADE PYELOGRAM;  Surgeon: Franchot Gallo, MD;  Location: [place];  Service: Urology;  Laterality: Bilateral;   CYSTOSCOPY WITH LITHOLAPAXY N/A 02/26/2013   Procedure: CYSTOSCOPY WITH LITHOLAPAXY;  Surgeon: Franchot Gallo, MD;  Location: [facility];  Service: Urology;  Laterality: N/A;   ENDOSCOPIC RETROGRADE CHOLANGIOPANCREATOGRAPHY (ERCP) WITH PROPOFOL N/A 02/22/2022   Procedure: ENDOSCOPIC RETROGRADE CHOLANGIOPANCREATOGRAPHY (ERCP) WITH PROPOFOL;  Surgeon: Carol Ada, MD;  Location: [place];  Service: Gastroenterology;  Laterality: N/A;   EYE SURGERY  Jan. 2016   cataract surgery both eyes   INGUINAL HERNIA REPAIR Right 11-08-2006   IR KYPHO EA ADDL LEVEL THORACIC OR LUMBAR  02/12/2021   IR RADIOLOGIST EVAL & MGMT  02/18/2021   MASS EXCISION N/A 03/03/2016   Procedure: EXCISION OF BACK  MASS;  Surgeon: Stark Klein, MD;  Location: [place];  Service: General;  Laterality: N/A;   MOHS SURGERY Left 1/ 2016   Dr Nevada Crane-- Basal cell   PROSTATE SURGERY     REMOVAL OF STONES  02/22/2022   Procedure: REMOVAL OF STONES;  Surgeon: Carol Ada, MD;  Location[facility] ENDOSCOPY;  Service: Gastroenterology;;   [name]  02/22/2022   Procedure: [name];  Surgeon: Carol Ada, MD;  Location: [facility] ENDOSCOPY;  Service: Gastroenterology;;   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-C N/A 06/22/2021   Procedure: TRANSURETHRAL RESECTION OF BLADDER TUMOR;  Surgeon: Franchot Gallo, MD;  Location: [facility];   Service: Urology;  Laterality: N/A;   TRANSURETHRAL RESECTION OF PROSTATE N/A 02/26/2013   Procedure: TRANSURETHRAL RESECTION OF THE PROSTATE WITH GYRUS INSTRUMENTS;  Surgeon: Franchot Gallo, MD;  Location: [facility];  Service: Urology;  Laterality: N/A;   TRANSURETHRAL RESECTION OF PROSTATE N/A 06/22/2021   Procedure: TRANSURETHRAL RESECTION OF THE PROSTATE (TURP);  Surgeon: Franchot Gallo, MD;  Location: [facility];  Service: Urology;  Laterality: N/A;    Family History  Problem Relation Age of Onset   Heart disease Mother        CHF   Bipolar disorder Mother    Heart disease Father        CHF    Social History   Socioeconomic History   Marital status: Married    Spouse name: Not on file   Number of children: 2   Years of education: 12   Highest education level: High school graduate  Occupational History    Employer: Retired  Tobacco Use   Smoking status: Former    Packs/day: 2.00    Years: 40.00    Total pack years: 80.00    Types: Cigarettes    Quit date: 02/15/2005    Years since quitting: 17.4   Smokeless tobacco: Never  Vaping Use   Vaping Use: Never used  Substance and Sexual Activity   Alcohol use: Not Currently    Comment: Occasional   Drug use: No   Sexual activity: Not Currently    Partners: Female  Other Topics Concern   Not on file  Social History Narrative   Exercise--  Walks with assistance -- using wheelchair more than the walker       LIves with wife , no stairs in home, caffeine - one cup coffee day, exercise - not much, Right handed, 12th grade, retired      One story home   Social Determinants of [place] Strain: Not on file  Food Insecurity: No Food Insecurity (03/02/2022)   Hunger Vital Sign    Worried About Running Out of Food in the Last Year: Never true    [place] in the Last Year: Never true  Transportation Needs: No Transportation Needs (03/02/2022)   PRAPARE -  Hydrologist (Medical): No    Lack of Transportation (Non-Medical): No  Physical Activity: Not on file  Stress: Not on file  Social Connections: Not on file  Intimate Partner Violence: Not At Risk (03/02/2022)   Humiliation, Afraid, Rape, and Kick questionnaire    Fear of Current or Ex-Partner: No    Emotionally Abused: No    Physically Abused: No    Sexually Abused: No    Outpatient Medications Prior to Visit  Medication Sig Dispense Refill   acetaminophen (TYLENOL) 325 MG tablet Take 2 tablets (650 mg total) by mouth every 6 (six) hours as needed for mild pain.  amantadine (SYMMETREL) 100 MG capsule Take 1 capsule (100 mg total) by mouth daily. (Patient taking differently: Take 100 mg by mouth daily. *Crushed*) 90 capsule 1   amLODipine (NORVASC) 10 MG tablet Take 1 tablet (10 mg total) by mouth every morning. 90 tablet 1   carBAMazepine (TEGRETOL) 100 MG/5ML suspension Take 5 mLs (100 mg total) by mouth in the morning and in the afternoon, and then 10 mLs (200 mg total) at bedtime. 600 mL 3   Cholecalciferol (VITAMIN D3) 25 MCG (1000 UT) CHEW Chew 1 tablet by mouth daily.     clobetasol cream (TEMOVATE) [id number] % Apply 1 Application topically 2 (two) times daily as needed. (Not to the face,groin or axilla.) 60 g 3   clopidogrel (PLAVIX) 75 MG tablet Take 1 tablet (75 mg total) by mouth daily. (Patient taking differently: Take 75 mg by mouth daily. *Crushed*) 90 tablet 1   clotrimazole-betamethasone (LOTRISONE) cream Apply 1 Application on to the skin daily. 30 g 0   ferrous sulfate 325 (65 FE) MG EC tablet Take 325 mg by mouth 2 (two) times daily. *Crushed*     fluticasone (CUTIVATE) [id number] % cream 1 application  daily as needed (psoriasis).  3   LORazepam (ATIVAN) 0.5 MG tablet Take 0.5 tablets (0.25 mg total) by mouth daily. *Crushed* 10 tablet 0   LORazepam (ATIVAN) 0.5 MG tablet Take 1/2 tablet (0.25 mg total) by mouth in the morning and take 1 tablet (0.5  mg total) in the afternoon at 4pm. 50 tablet 4   LORazepam ER (LOREEV XR) 1 MG CS24 Take 1 mg by mouth at bedtime. *Crushed* (Patient taking differently: Take 1 mg by mouth at bedtime. *Crushed*) 20 capsule 0   LORazepam ER (LOREEV XR) 1 MG CS24 Take 1 capsule (1 mg total) by mouth at bedtime as directed. 30 capsule 4   LORazepam ER (LOREEV XR) 1 MG CS24 Take 1 mg by mouth at bedtime. *Crushed* 30 capsule 3   lubiprostone (AMITIZA) 24 MCG capsule Take 1 capsule (24 mcg total) by mouth daily with breakfast. 30 capsule 2   Multiple Vitamins-Minerals (ADULT ONE DAILY GUMMIES) CHEW Chew 1 tablet by mouth every morning.     pantoprazole (PROTONIX) 40 MG tablet Take 1 tablet (40 mg total) by mouth daily. (Patient taking differently: Take 40 mg by mouth daily. *Crushed*) 90 tablet 3   polyethylene glycol (MIRALAX / GLYCOLAX) 17 g packet Take 17 g by mouth daily. 14 each 0   tamsulosin (FLOMAX) 0.4 MG CAPS capsule Take 1 capsule (0.4 mg total) by mouth daily after supper. (Patient taking differently: Take 0.4 mg by mouth daily after supper. *Crushed*) 90 capsule 1   tiZANidine (ZANAFLEX) 4 MG tablet Take 1 tablet (4 mg total) by mouth every 6 (six) hours as needed for muscle spasms. 30 tablet 0   traZODone (DESYREL) 150 MG tablet Take 0.5 tablets (75 mg total) by mouth at bedtime. *Crushed* 20 tablet 0   triamcinolone cream (KENALOG) 0.1 % Apply 1 application topically 2 (two) times daily. (Patient taking differently: Apply 1 Application topically 2 (two) times daily as needed (rash).) 30 g 0   Facility-Administered Medications Prior to Visit  Medication Dose Route Frequency Provider Last Rate Last Admin   gemcitabine (GEMZAR) chemo syringe for bladder instillation 2,000 mg  2,000 mg Bladder Instillation Once Franchot Gallo, MD        Allergies  Allergen Reactions   Bee Venom Anaphylaxis   Strawberry Extract Hives   Latex Itching  Zetia [Ezetimibe] Other (See Comments)    Intolerance     Adhesive [Tape] Other (See Comments)    blisters   Statins Other (See Comments)    myalgias    Review of Systems  Constitutional:  Negative for fever and malaise/fatigue.  HENT:  Negative for congestion.   Eyes:  Negative for blurred vision.  Respiratory:  Negative for shortness of breath.   Cardiovascular:  Negative for chest pain, palpitations and leg swelling.  Gastrointestinal:  Positive for abdominal pain (bladder pain). Negative for blood in stool and nausea.  Genitourinary:  Positive for dysuria. Negative for frequency.       (+)cloudy urine   Musculoskeletal:  Negative for falls.  Skin:  Negative for rash.  Neurological:  Negative for dizziness, loss of consciousness and headaches.  Endo/Heme/Allergies:  Negative for environmental allergies.  Psychiatric/Behavioral:  Negative for depression. The patient is not nervous/anxious.        Objective:    Physical Exam Vitals and nursing note reviewed.  Constitutional:      General: He is not in acute distress.    Appearance: Normal appearance. He is not ill-appearing.  HENT:     Head: Normocephalic and atraumatic.     Right Ear: External ear normal.     Left Ear: External ear normal.  Eyes:     Extraocular Movements: Extraocular movements intact.     Pupils: Pupils are equal, round, and reactive to light.  Cardiovascular:     Rate and Rhythm: Normal rate and regular rhythm.     Heart sounds: Normal heart sounds. No murmur heard.    No gallop.  Pulmonary:     Effort: Pulmonary effort is normal. No respiratory distress.     Breath sounds: Normal breath sounds. No wheezing or rales.  Abdominal:     General: There is no distension.     Tenderness: There is abdominal tenderness in the suprapubic area. There is no right CVA tenderness, left CVA tenderness, guarding or rebound.  Skin:    General: Skin is warm and dry.  Neurological:     Mental Status: He is alert and oriented to person, place, and time.  Psychiatric:         Judgment: Judgment normal.     BP 122/72 (BP Location: Left Arm, Patient Position: Sitting, Cuff Size: Normal)   Pulse 82   Temp 98.4 F (36.9 C) (Oral)   Resp 16   Ht 5' 11"$  (1.803 m)   Wt 170 lb (77.1 kg) Comment: wheelchair  SpO2 98%   BMI 23.71 kg/m  Wt Readings from Last 3 Encounters:  08/03/22 170 lb (77.1 kg)  07/06/22 180 lb (81.6 kg)  02/26/22 180 lb (81.6 kg)       Assessment & Plan:  Dysuria Assessment & Plan: Keflex x 10 days  Culture sent  Instructed pt to drink more water  Return to office as needed   Orders: -     Urine Culture -     POCT Urinalysis Dipstick (Automated) -     Cephalexin; Take 1 capsule (500 mg total) by mouth 2 (two) times daily.  Dispense: 20 capsule; Refill: 0    I, Ann Held, DO, personally preformed the services described in this documentation.  All medical record entries made by the scribe were at my direction and in my presence.  I have reviewed the chart and discharge instructions (if applicable) and agree that the record reflects my personal performance and is accurate  and complete. 08/03/2022   I,[name],acting as a scribe for Ann Held, DO.,have documented all relevant documentation on the behalf of Ann Held, DO,as directed by  Ann Held, DO while in the presence of Ann Held, DO.   Ann Held, DO

## 2022-08-04 ENCOUNTER — Encounter: Payer: Self-pay | Admitting: Physical Therapy

## 2022-08-04 ENCOUNTER — Ambulatory Visit: Payer: PPO | Admitting: Physical Therapy

## 2022-08-04 DIAGNOSIS — R262 Difficulty in walking, not elsewhere classified: Secondary | ICD-10-CM

## 2022-08-04 DIAGNOSIS — M6281 Muscle weakness (generalized): Secondary | ICD-10-CM

## 2022-08-04 DIAGNOSIS — Z9181 History of falling: Secondary | ICD-10-CM

## 2022-08-04 DIAGNOSIS — R278 Other lack of coordination: Secondary | ICD-10-CM

## 2022-08-04 DIAGNOSIS — I69354 Hemiplegia and hemiparesis following cerebral infarction affecting left non-dominant side: Secondary | ICD-10-CM

## 2022-08-04 DIAGNOSIS — R296 Repeated falls: Secondary | ICD-10-CM

## 2022-08-04 LAB — URINE CULTURE
MICRO NUMBER:: 14589484
SPECIMEN QUALITY:: ADEQUATE

## 2022-08-04 NOTE — Therapy (Signed)
OUTPATIENT PHYSICAL THERAPY NEURO Treatment   Patient Name: William [name] MRN: [id number] DOB:02-27-1941, 82 y.o., male Today's Date: 08/04/2022  PCP: Lowne Chase, [place] PROVIDER: same  END OF SESSION:  PT End of Session - 08/04/22 1533     Visit Number 9    Date for PT Re-Evaluation 09/29/22    PT Start Time 1527    PT Stop Time 1613    PT Time Calculation (min) 46 min    Equipment Utilized During Treatment Gait belt    Activity Tolerance Patient tolerated treatment well    Behavior During Therapy Impulsive             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   HTN (hypertension) 07/19/2015   Hyperlipidemia    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   Mild, related to stroke history   Nocturia    Renal insufficiency 06/25/2013   S/P carotid endarterectomy    BILATERAL ICA--  PATENT PER DUPLEX  05-19-2012   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/26/2017   Right calf   Urinary frequency    Vitamin D deficiency    Past Surgical History:  Procedure Laterality Date   APPENDECTOMY  AS CHILD   CARDIOVASCULAR STRESS TEST  03-27-2012  DR CRENSHAW   LOW RISK LEXISCAN STUDY-- PROBABLE NORMAL PERFUSION AND SOFT TISSUE ATTENUATION/  NO ISCHEMIA/ EF 51%   CAROTID ENDARTERECTOMY Bilateral LEFT  11-12-2008  DR GREG HAYES   RIGHT ICA  2006  ([facility])   CHOLECYSTECTOMY N/A 02/23/2022   Procedure: LAPAROSCOPIC CHOLECYSTECTOMY;  Surgeon: Felicie Morn, MD;  Location: [place];  Service: General;  Laterality: N/A;   CYSTOSCOPY W/ RETROGRADES  Bilateral 06/22/2021   Procedure: CYSTOSCOPY WITH RETROGRADE PYELOGRAM;  Surgeon: Franchot Gallo, MD;  Location: [place];  Service: Urology;  Laterality: Bilateral;   CYSTOSCOPY WITH LITHOLAPAXY N/A 02/26/2013   Procedure: CYSTOSCOPY WITH LITHOLAPAXY;  Surgeon: Franchot Gallo, MD;  Location: [facility];  Service: Urology;  Laterality: N/A;   ENDOSCOPIC RETROGRADE CHOLANGIOPANCREATOGRAPHY (ERCP) WITH PROPOFOL N/A 02/22/2022   Procedure: ENDOSCOPIC RETROGRADE CHOLANGIOPANCREATOGRAPHY (ERCP) WITH PROPOFOL;  Surgeon: Carol Ada, MD;  Location: [place];  Service: Gastroenterology;  Laterality: N/A;   EYE SURGERY  Jan. 2016   cataract surgery both eyes   INGUINAL HERNIA REPAIR Right 11-08-2006   IR KYPHO EA ADDL LEVEL THORACIC OR LUMBAR  02/12/2021   IR RADIOLOGIST EVAL & MGMT  02/18/2021   MASS EXCISION N/A 03/03/2016   Procedure: EXCISION OF BACK  MASS;  Surgeon: Stark Klein, MD;  Location: [place];  Service: General;  Laterality: N/A;   MOHS SURGERY Left 1/ 2016   Dr Nevada Crane-- Basal cell   PROSTATE SURGERY     REMOVAL OF STONES  02/22/2022   Procedure: REMOVAL OF STONES;  Surgeon: Carol Ada, MD;  Location: [place];  Service: Gastroenterology;;   [name]  02/22/2022   Procedure: [name];  Surgeon: Carol Ada, MD;  Location: [place];  Service: Gastroenterology;;   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-C N/A 06/22/2021   Procedure: TRANSURETHRAL RESECTION OF BLADDER TUMOR;  Surgeon: Franchot Gallo, MD;  Location: [facility];  Service: Urology;  Laterality: N/A;   TRANSURETHRAL RESECTION OF PROSTATE N/A 02/26/2013   Procedure: TRANSURETHRAL RESECTION OF THE PROSTATE WITH GYRUS INSTRUMENTS;  Surgeon: Franchot Gallo, MD;  Location: [facility];  Service: Urology;  Laterality: N/A;   TRANSURETHRAL RESECTION OF PROSTATE N/A 06/22/2021   Procedure: TRANSURETHRAL  RESECTION OF THE PROSTATE (TURP);  Surgeon: Franchot Gallo, MD;  Location[facility];  Service: Urology;  Laterality: N/A;   Patient Active Problem List   Diagnosis Date Noted   Dysuria 08/03/2022   Nonintractable headache 07/01/2022   Bilateral impacted cerumen 06/24/2022   Rash 06/15/2022   Postoperative ileus ([place]) 02/27/2022   Ileus, postoperative ([place]) 02/26/2022   Choledocholithiasis 02/19/2022   DNR (do not resuscitate) 02/19/2022   Left elbow pain 01/26/2022   Mid back pain on left side 01/26/2022   Rib pain 01/26/2022   Acute pain of left shoulder 11/12/2021   Leukocytes in urine 11/12/2021   Urinary frequency 11/12/2021   Thrush 10/08/2021   Hemiplegia, dominant side S/P CVA (cerebrovascular accident) ([place]) 09/11/2021   Insomnia    Prediabetes    Acute 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 ([place])    Basal ganglia infarction ([place]) 07/29/2021   Transaminitis 07/27/2021   UTI (urinary tract infection) 07/27/2021   CVA (cerebral vascular accident) ([place]) 07/27/2021   Fall 07/27/2021   Hyperglycemia 07/27/2021   Cholelithiasis 07/27/2021   Hypoxia 07/27/2021   Nausea and vomiting [id number]   Acute metabolic encephalopathy [id number]   Normocytic anemia 07/27/2021   Chronic back pain 07/27/2021   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[place]) 06/22/2021   Closed fracture of first lumbar vertebra with routine healing 02/03/2021   Closed fracture of multiple ribs 11/18/2020   Anxiety 01/29/2020   Leg pain, bilateral 01/29/2020   Ingrown toenail 07/13/2019   Lumbar spondylosis 05/02/2018   Pain in left knee 03/09/2018   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   Trochanteric bursitis of left hip 01/16/2018   Preventative health care 09/26/2017   HTN (hypertension) 07/19/2015   Hyperlipidemia 07/19/2015   Great toe pain 02/11/2014   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   Obesity (BMI 30-39.9) 06/25/2013    Renal insufficiency 06/25/2013   Weakness of left arm 06/25/2013   Sebaceous cyst 03/03/2011   Sprain of lumbar region 07/31/2010   Rib pain, left 08/29/2009   Carotid artery stenosis, asymptomatic, bilateral 05/02/2009   Eczema, atopic 05/31/2008   Vitamin D deficiency 03/01/2008   BPH (benign prostatic hyperplasia) 08/06/2007   Fasting hyperglycemia 12/21/2006   History of right MCA infarct 2006    ONSET DATE: 06/29/22  REFERRING DIAG:  Diagnosis  I63.9 (ICD-10-CM) - Cerebrovascular accident (CVA), unspecified mechanism ([place])  W19.XXXD (ICD-10-CM) - Fall, subsequent encounter    THERAPY DIAG:  Other lack of coordination  Muscle weakness (generalized)  Hemiplegia and hemiparesis following cerebral infarction affecting left non-dominant side (HCC)  History of falling  Difficulty in walking, not elsewhere classified  Repeated falls  Rationale for Evaluation and Treatment: Rehabilitation  SUBJECTIVE:  SUBJECTIVE STATEMENT: No falls, doing well.  Pateint more talkative today  PERTINENT HISTORY:  RIDA STEENO is a 82 year old man with dementia, CKD, HTN, CAD, HLD and history of CVA  (2006, 2021), Parkinson's  PAIN:  Are you having pain? No  PRECAUTIONS: Fall  WEIGHT BEARING RESTRICTIONS: No  FALLS: Has patient fallen in last 6 months? No  LIVING ENVIRONMENT: Lives with: lives with their family and lives with their spouse Lives in: House/apartment Stairs:  1 brick high step Has following equipment at home: Environmental consultant - 2 wheeled, Wheelchair (manual), Shower bench, bed side commode, Grab bars, and hospital bed, sliding pad for car  PLOF: Independent with basic ADLs and Independent with household mobility without device  PATIENT GOALS: Walk around the block with his wife. Be able  to use the dining room chair rather than have to use the W/C. Increased I with bed mobility and simple tasks at home like make some coffee or brush his teeth, Step over tub to shower, has bench, but he can't really use it. Up and down the one step to get to back deck.  OBJECTIVE:   DIAGNOSTIC FINDINGS:  MRI 2021 with degenerative changes in lumbar spine, L3-4 R subarticular stenosis  COGNITION: Overall cognitive status: History of cognitive impairments - at baseline   SENSATION: Not tested  COORDINATION: Moderately impaired BLE   MUSCLE LENGTH: Hamstrings: severely restricted B Thomas test: Severely restricted B.  POSTURE: rounded shoulders, forward head, increased thoracic kyphosis, posterior pelvic tilt, and flexed trunk   LOWER EXTREMITY ROM:   BLE extremely stiff throughout, limited hip ROM in all planes, B knees limited in extension.   LOWER EXTREMITY MMT:  3+/5 throughout. Unable to determine accuracy of MMT due to cognition. Functionally demosntrates poor coordinated activation and poor muscular endurance.   BED MOBILITY: Min A for Supine<> Sit. Patient was using a ladder in his hospital bed and could move MI.  TRANSFERS: Min A, mod VC, tends to lean back.  CURB: Min A-Has one step out to his deck.  STAIRS: N/A   GAIT: Gait pattern: step to pattern, decreased arm swing- Right, decreased arm swing- Left, decreased step length- Right, decreased step length- Left, shuffling, festinating, trunk flexed, narrow BOS, poor foot clearance- Right, and poor foot clearance- Left Distance walked: 7' Assistive device utilized: Environmental consultant - 2 wheeled Level of assistance: Occasional min A Comments: mod VC to stay close to walker, take longer steps.  FUNCTIONAL TESTS:  5 times sit to stand: 38.23 Timed up and go (TUG): 47.91  with RW, 07/21/22 TUG 29 seconds without device  TODAY'S TREATMENT:                                                                                                                                DATE:  08/04/22 Met him at the car, helped with the transfer out of the car, then walked outside with cues for big smooth  steps around [place] Bike level 3 x 6 minutes Nustep Level 5 x 5 minutes Gait outside down and up two curbs CGA a lot of cues for steps and to slow down Sit to stand with a lot of cues for nose over toes Seated volleyball, some standing with knees touching bed In pbars sides stepping, backward walking, marching When leaving walked out and around the [place] again to the car, much more assist and cues as he was tired and started shuffling Some bed mobility in a single room without distractions  08/02/22 NuStep L5 x 6 minutes Supine rotational stretch to trunk, knees in hooklying, rotate to L, reach with L arm to R and back x 5, repeat to the R. Roll to L side lie, reach with R arm into rotation forward and back, tried on L, but his L hip hurt in side lie. Seated trunk rotation to R, reach with L hand, sliding out on mat and back x 5, repeat to R. Seated forward flexion over physioball 5 x 3 sec holds Ambulation with 2# WATE bar held at chest level, emphasizing long steps, U UE support. 29' B side stepping in parallel bars, on airex plank with BUE support, 3 x each direction. Walking in figure 8 around 2 cones on the floor, facing 1 direction at all times to ensure changing directions, CGA, max VC, mildly unsteady. Alternating step taps on 2, 6" steps for balance.  07/28/22 NuStep L3 x 6 minutes. Stand with back to corner. Use BUE to lift red physioball and rotate to R to touch target on wall, return to cneter, then repeat to L. Walk with ball, reaching straight up to touch overhead target without LOB, CGA for both. Side to side step over 2" obstacle on the floor. He was able to move side to side, but was not able to follow the commands for safe performance. Step over air ex planks on the floor, using R, then L legs, BUE support on  parallel bars. Stable Climbing up and down steps using step over step technique. Required cues to move L hand along with his feet. 2# weight in BUE, reach down to knee height to touch mat, turn to R and up to touch target on wall. 10 x in each direction. Good reaching above shoulder height, needed cues to engage L hand.  07/26/22 Nustep Level 5 x 6 minutes Sit to stand with various heights x 5 each a lot of verbal cues needed and at times some assist Volley ball sitting and standing, some ball kicking Gait without device 3x 150 feet with cues for step length and speed also distractions In Pbars side steps, big steps and backward steps In pbars head and trunk rotations Sitting and standing reaching for cones iwht each hand Supine rolling to sides, feet on ball trunk rotation, bridges Bridges without ball Sitting trunk elongations with reaching and twisting Holding onto rail 12 inch toe touches  07/21/22 Nustep level 5 x 6 minutes TUG 29 seconds without device SBA Volleyball sitting, standing with legs touching table and then without Partial sit ups 6" toe touches Cone touches working on weight shifts, body turns and reaching for balance 4" step ups, close CGA and at times mod A due to LOB, needed a lot of redirecting to stay on task as he is easily distracted Ball kicks with back of knees touching the table Side stepping Walking without device.  Outside around [place] and to his car with wife following  with w/c  07/19/22:   Nustep L5 5 min, for tissue perfusion, endurance, coordination At steps in smaller room:  Standing for toe raises 10x Tandem standing forward foot on airex, with trunk rotation to each side, to focus on visual cue( waved to his wife) attempted to perform without UE support  Full turn R and L , x 2, with patient cued to place himself in front of mirror-mirror used for feedback to improve spatial awareness In ll bars  forward steps over airex balance beam  4 lengths,  with 2 airex balance beams , cues to increase step length particularly on L Standing lateral step overs airex balance beam cues to place both feet over, patient tends to avoid placing second foot down  Seated for forward rolls with 65 cm therapeutic ball , with partial standing, 10 reps  Seated high marching with alternate arm punches, one 30 sec bout, patinet unable to really complete effectively Standing at steps with 21/2# cuff weight on each leg, for hip flex/ext and hip abd, 10 x each Gait without device 2 separate sets, 100'. Verbal cues to elongate step length B  07/15/22: Neuromuscular re education:   Nustep L5 5 min, for tissue perfusion, endurance, coordination At steps in smaller room:  Standing for toe raises 10x  Full turn R and L , x 2, with patient cued to place himself in front of mirror-mirror used for feedback to improve spatial awareness Standing for forward/back steps over airex balance beam 10x each leg, cues to increase step length particularly on L Standing lateral step overs airex balance, cues to place both feet over, patient tends to avoid placing second foot down Seated for forward rolls with 65 cm therapeutic ball , with partial standing, 10 reps Standing for bouncing large ball back and forth with patient's wife, facing R and facing L  Seated high march overs with each leg, over cuff weight on floor Standing at steps with 21/2# cuff weight on each leg, for hip flex/ext and hip abd, 10 x each Gait without device 2 separate sets, 100'. Verbal cues to elongate step length B  PATIENT EDUCATION: Education details: POC Person educated: Patient and Spouse Education method: Explanation Education comprehension: verbalized understanding  HOME EXERCISE PROGRAM: TBD  GOALS: Goals reviewed with patient? Yes  SHORT TERM GOALS: Target date: 07/19/22  I with initial HEP Baseline: Goal status: ongoing  2. Decrease 5 x STS to < 25 sec to demonstrate improved functional  strength  Baseline: 38 sec  Goal status: 07/28/22-ongoing  3. Decrease TUG to < 35 sec to demonstrate improved functional balance  Baseline: 47 sec  Goal status: met 07/21/22  4. Perform sit <> stand transfers from average chair height with S and VC to be able to use the swivel chair at his dining table.  Baseline: Min A  Goal status: 08/02/22-Inconsistently performs  with S from average height. At times requires CGA to cue forward weight shift. ongoing  LONG TERM GOALS: Target date: 09/29/22  Patient will be able to step over his tub at home using grab bars for stability with CGA to access his shower. Baseline: Unable- has bench, but wife reports he steps over then sits. Goal status: INITIAL  2.  Decrease 5 x STS to < 18 sec to demonstrate improved functional strength Baseline:  Goal status: INITIAL  3.  Decrease TUG to < 25 sec to demonstrate improved functional balance Baseline:  Goal status: 07/28/22-ongoing  4.  Patient will perform his  bed mobility with MI. Baseline: Min A Goal status: INITIAL  5.  Patient will ambulate at least 400' with LRAD, CGA, demonstrate improved upright posture, longer step length. Baseline:  Goal status: INITIAL  6.  Patient will manage up and down a single step with LRAD, CGA to access his deck at home. Baseline: Unable Goal status: INITIAL  ASSESSMENT:  CLINICAL IMPRESSION: Patient reports no falls. I had him walk more and do more sit to stand as his wife reports some increased difficulty standing up out of a chair, needs a lot of cues and does much better in room with less distractions.  He did fatigue and once fatigued the gait was much worse with shuffling  OBJECTIVE IMPAIRMENTS: Abnormal gait, decreased activity tolerance, decreased balance, decreased cognition, decreased coordination, decreased endurance, decreased mobility, difficulty walking, decreased ROM, decreased strength, decreased safety awareness, impaired flexibility, impaired UE  functional use, improper body mechanics, and postural dysfunction.   ACTIVITY LIMITATIONS: carrying, lifting, bending, sitting, standing, squatting, stairs, transfers, bed mobility, bathing, toileting, dressing, hygiene/grooming, and locomotion level  PARTICIPATION LIMITATIONS: meal prep, cleaning, laundry, interpersonal relationship, shopping, and community activity  PERSONAL FACTORS: Age, Past/current experiences, and 3+ comorbidities: CVA, Parkinson's traits, dementia  are also affecting patient's functional outcome.   REHAB POTENTIAL: Good  CLINICAL DECISION MAKING: Evolving/moderate complexity  EVALUATION COMPLEXITY: Moderate  PLAN:  PT FREQUENCY: 2x/week  PT DURATION: 12 weeks  PLANNED INTERVENTIONS: Therapeutic exercises, Therapeutic activity, Neuromuscular re-education, Balance training, Gait training, Patient/Family education, Self Care, Joint mobilization, Stair training, Moist heat, and Manual therapy  PLAN FOR NEXT SESSION: Trunk and extremity mobilization, balance, strength, functional mobility re-education.   Lum Babe, PT 08/04/22 3:34 PM

## 2022-08-05 ENCOUNTER — Encounter (HOSPITAL_COMMUNITY)
Admission: RE | Admit: 2022-08-05 | Discharge: 2022-08-05 | Disposition: A | Discharge status: Home / Self Care | Payer: PPO | Source: Ambulatory Visit | Attending: Neurology | Admitting: Neurology

## 2022-08-05 DIAGNOSIS — Z8673 Personal history of transient ischemic attack (TIA), and cerebral infarction without residual deficits: Secondary | ICD-10-CM | POA: Diagnosis not present

## 2022-08-05 DIAGNOSIS — I639 Cerebral infarction, unspecified: Secondary | ICD-10-CM | POA: Diagnosis not present

## 2022-08-05 DIAGNOSIS — G20C Parkinsonism, unspecified: Secondary | ICD-10-CM | POA: Diagnosis not present

## 2022-08-05 DIAGNOSIS — G20A1 Parkinson's disease without dyskinesia, without mention of fluctuations: Secondary | ICD-10-CM | POA: Diagnosis not present

## 2022-08-05 DIAGNOSIS — R292 Abnormal reflex: Secondary | ICD-10-CM | POA: Diagnosis not present

## 2022-08-05 DIAGNOSIS — F0390 Unspecified dementia without behavioral disturbance: Secondary | ICD-10-CM

## 2022-08-05 MED ORDER — POTASSIUM IODIDE (ANTIDOTE) 130 MG PO TABS
ORAL_TABLET | ORAL | Status: AC
  Administered 2022-08-05: 130 mg via ORAL
  Filled 2022-08-05: qty 1

## 2022-08-05 MED ORDER — IOFLUPANE I 123 185 MBQ/2.5ML IV SOLN
4.4000 | Freq: Once | INTRAVENOUS | Status: AC | PRN
  Administered 2022-08-05: 4.4 via INTRAVENOUS

## 2022-08-09 ENCOUNTER — Ambulatory Visit: Payer: PPO | Admitting: Rehabilitative and Restorative Service Providers"

## 2022-08-09 ENCOUNTER — Ambulatory Visit: Payer: PPO | Admitting: Physical Therapy

## 2022-08-09 ENCOUNTER — Other Ambulatory Visit (HOSPITAL_BASED_OUTPATIENT_CLINIC_OR_DEPARTMENT_OTHER): Payer: Self-pay

## 2022-08-09 ENCOUNTER — Encounter: Payer: Self-pay | Admitting: Physical Therapy

## 2022-08-09 ENCOUNTER — Other Ambulatory Visit: Payer: Self-pay

## 2022-08-09 DIAGNOSIS — I69354 Hemiplegia and hemiparesis following cerebral infarction affecting left non-dominant side: Secondary | ICD-10-CM

## 2022-08-09 DIAGNOSIS — Z9181 History of falling: Secondary | ICD-10-CM

## 2022-08-09 DIAGNOSIS — M6281 Muscle weakness (generalized): Secondary | ICD-10-CM

## 2022-08-09 DIAGNOSIS — R262 Difficulty in walking, not elsewhere classified: Secondary | ICD-10-CM

## 2022-08-09 DIAGNOSIS — R278 Other lack of coordination: Secondary | ICD-10-CM

## 2022-08-09 DIAGNOSIS — R296 Repeated falls: Secondary | ICD-10-CM

## 2022-08-09 NOTE — Therapy (Signed)
OUTPATIENT PHYSICAL THERAPY NEURO Treatment  Progress Note Reporting Period 07/07/22 to 08/09/22  See note below for Objective Data and Assessment of Progress/Goals.     Patient Name: William [name] MRN: [id number] DOB:12-20-1940, 82 y.o., male Today's Date: 08/09/2022  PCP: Lowne Chase, [place] PROVIDER: same  END OF SESSION:  PT End of Session - 08/09/22 1502     Visit Number 10    Date for PT Re-Evaluation 09/29/22    PT Start Time 1457    PT Stop Time 1540    PT Time Calculation (min) 43 min    Equipment Utilized During Treatment Gait belt    Activity Tolerance Patient tolerated treatment well    Behavior During Therapy Impulsive             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   HTN (hypertension) 07/19/2015   Hyperlipidemia    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   Mild, related to stroke history   Nocturia    Renal insufficiency 06/25/2013   S/P carotid endarterectomy    BILATERAL ICA--  PATENT PER DUPLEX  05-19-2012   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/26/2017   Right calf   Urinary frequency    Vitamin D deficiency    Past Surgical History:  Procedure Laterality Date   APPENDECTOMY  AS CHILD   CARDIOVASCULAR STRESS TEST  03-27-2012  DR CRENSHAW   LOW RISK LEXISCAN STUDY-- PROBABLE NORMAL PERFUSION AND SOFT TISSUE ATTENUATION/  NO ISCHEMIA/ EF 51%   CAROTID ENDARTERECTOMY Bilateral LEFT  11-12-2008  DR GREG HAYES   RIGHT ICA  2006  ([facility])   CHOLECYSTECTOMY N/A 02/23/2022   Procedure: LAPAROSCOPIC CHOLECYSTECTOMY;   Surgeon: Felicie Morn, MD;  Location: [place];  Service: General;  Laterality: N/A;   CYSTOSCOPY W/ RETROGRADES Bilateral 06/22/2021   Procedure: CYSTOSCOPY WITH RETROGRADE PYELOGRAM;  Surgeon: Franchot Gallo, MD;  Location: [facility];  Service: Urology;  Laterality: Bilateral;   CYSTOSCOPY WITH LITHOLAPAXY N/A 02/26/2013   Procedure: CYSTOSCOPY WITH LITHOLAPAXY;  Surgeon: Franchot Gallo, MD;  Location: [facility];  Service: Urology;  Laterality: N/A;   ENDOSCOPIC RETROGRADE CHOLANGIOPANCREATOGRAPHY (ERCP) WITH PROPOFOL N/A 02/22/2022   Procedure: ENDOSCOPIC RETROGRADE CHOLANGIOPANCREATOGRAPHY (ERCP) WITH PROPOFOL;  Surgeon: Carol Ada, MD;  Location: [place];  Service: Gastroenterology;  Laterality: N/A;   EYE SURGERY  Jan. 2016   cataract surgery both eyes   INGUINAL HERNIA REPAIR Right 11-08-2006   IR KYPHO EA ADDL LEVEL THORACIC OR LUMBAR  02/12/2021   IR RADIOLOGIST EVAL & MGMT  02/18/2021   MASS EXCISION N/A 03/03/2016   Procedure: EXCISION OF BACK  MASS;  Surgeon: Stark Klein, MD;  Location: [facility];  Service: General;  Laterality: N/A;   MOHS SURGERY Left 1/ 2016   Dr Nevada Crane-- Basal cell   PROSTATE SURGERY     REMOVAL OF STONES  02/22/2022   Procedure: REMOVAL OF STONES;  Surgeon: Carol Ada, MD;  Location: [place];  Service: Gastroenterology;;   [name]  02/22/2022   Procedure: [name];  Surgeon: Carol Ada, MD;  Location: [facility];  Service: Gastroenterology;;   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-C N/A 06/22/2021   Procedure: TRANSURETHRAL RESECTION OF BLADDER TUMOR;  Surgeon: Franchot Gallo, MD;  Location: [facility];  Service: Urology;  Laterality: N/A;   TRANSURETHRAL RESECTION OF PROSTATE N/A 02/26/2013   Procedure: TRANSURETHRAL RESECTION OF THE PROSTATE WITH GYRUS INSTRUMENTS;  Surgeon: Franchot Gallo, MD;  Location: [facility];   Service: Urology;  Laterality: N/A;   TRANSURETHRAL RESECTION OF PROSTATE N/A 06/22/2021   Procedure: TRANSURETHRAL RESECTION OF THE PROSTATE (TURP);  Surgeon: Franchot Gallo, MD;  Location: [facility];  Service: Urology;  Laterality: N/A;   Patient Active Problem List   Diagnosis Date Noted   Dysuria 08/03/2022   Nonintractable headache 07/01/2022   Bilateral impacted cerumen 06/24/2022   Rash 06/15/2022   Postoperative ileus ([place]) 02/27/2022   Ileus, postoperative ([place]) 02/26/2022   Choledocholithiasis 02/19/2022   DNR (do not resuscitate) 02/19/2022   Left elbow pain 01/26/2022   Mid back pain on left side 01/26/2022   Rib pain 01/26/2022   Acute pain of left shoulder 11/12/2021   Leukocytes in urine 11/12/2021   Urinary frequency 11/12/2021   Thrush 10/08/2021   Hemiplegia, dominant side S/P CVA (cerebrovascular accident) ([place]) 09/11/2021   Insomnia    Prediabetes    Acute 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 ([place])    Basal ganglia infarction ([place]) 07/29/2021   Transaminitis 07/27/2021   UTI (urinary tract infection) 07/27/2021   CVA (cerebral vascular accident) ([place]) 07/27/2021   Fall 07/27/2021   Hyperglycemia 07/27/2021   Cholelithiasis 07/27/2021   Hypoxia 07/27/2021   Nausea and vomiting [id number]   Acute metabolic encephalopathy [id number]   Normocytic anemia 07/27/2021   Chronic back pain 07/27/2021   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[place]) 06/22/2021   Closed fracture of first lumbar vertebra with routine healing 02/03/2021   Closed fracture of multiple ribs 11/18/2020   Anxiety 01/29/2020   Leg pain, bilateral 01/29/2020   Ingrown toenail 07/13/2019   Lumbar spondylosis 05/02/2018   Pain in left knee 03/09/2018   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   Trochanteric bursitis of left hip 01/16/2018   Preventative health care 09/26/2017   HTN (hypertension) 07/19/2015   Hyperlipidemia 07/19/2015    Great toe pain 02/11/2014   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   Obesity (BMI 30-39.9) 06/25/2013   Renal insufficiency 06/25/2013   Weakness of left arm 06/25/2013   Sebaceous cyst 03/03/2011   Sprain of lumbar region 07/31/2010   Rib pain, left 08/29/2009   Carotid artery stenosis, asymptomatic, bilateral 05/02/2009   Eczema, atopic 05/31/2008   Vitamin D deficiency 03/01/2008   BPH (benign prostatic hyperplasia) 08/06/2007   Fasting hyperglycemia 12/21/2006   History of right MCA infarct 2006    ONSET DATE: 06/29/22  REFERRING DIAG:  Diagnosis  I63.9 (ICD-10-CM) - Cerebrovascular accident (CVA), unspecified mechanism ([place])  W19.XXXD (ICD-10-CM) - Fall, subsequent encounter    THERAPY DIAG:  Other lack of coordination  Muscle weakness (generalized)  Hemiplegia and hemiparesis following cerebral infarction affecting left non-dominant side (HCC)  History of falling  Difficulty in walking, not elsewhere classified  Repeated falls  Rationale for Evaluation and Treatment: Rehabilitation  SUBJECTIVE:  SUBJECTIVE STATEMENT: No falls, doing well.  Pateint more talkative today  PERTINENT HISTORY:  MICKLE DOVIDIO is a 82 year old man with dementia, CKD, HTN, CAD, HLD and history of CVA  (2006, 2021), Parkinson's  PAIN:  Are you having pain? No  PRECAUTIONS: Fall  WEIGHT BEARING RESTRICTIONS: No  FALLS: Has patient fallen in last 6 months? No  LIVING ENVIRONMENT: Lives with: lives with their family and lives with their spouse Lives in: House/apartment Stairs:  1 brick high step Has following equipment at home: Environmental consultant - 2 wheeled, Wheelchair (manual), Shower bench, bed side commode, Grab bars, and hospital bed, sliding pad for car  PLOF: Independent with basic ADLs and  Independent with household mobility without device  PATIENT GOALS: Walk around the block with his wife. Be able to use the dining room chair rather than have to use the W/C. Increased I with bed mobility and simple tasks at home like make some coffee or brush his teeth, Step over tub to shower, has bench, but he can't really use it. Up and down the one step to get to back deck.  OBJECTIVE:   DIAGNOSTIC FINDINGS:  MRI 2021 with degenerative changes in lumbar spine, L3-4 R subarticular stenosis  COGNITION: Overall cognitive status: History of cognitive impairments - at baseline   SENSATION: Not tested  COORDINATION: Moderately impaired BLE   MUSCLE LENGTH: Hamstrings: severely restricted B Thomas test: Severely restricted B.  POSTURE: rounded shoulders, forward head, increased thoracic kyphosis, posterior pelvic tilt, and flexed trunk   LOWER EXTREMITY ROM:   BLE extremely stiff throughout, limited hip ROM in all planes, B knees limited in extension.   LOWER EXTREMITY MMT:  3+/5 throughout. Unable to determine accuracy of MMT due to cognition. Functionally demosntrates poor coordinated activation and poor muscular endurance.   BED MOBILITY: Min A for Supine<> Sit. Patient was using a ladder in his hospital bed and could move MI.  TRANSFERS: Min A, mod VC, tends to lean back.  CURB: Min A-Has one step out to his deck.  STAIRS: N/A   GAIT: Gait pattern: step to pattern, decreased arm swing- Right, decreased arm swing- Left, decreased step length- Right, decreased step length- Left, shuffling, festinating, trunk flexed, narrow BOS, poor foot clearance- Right, and poor foot clearance- Left Distance walked: 73' Assistive device utilized: Environmental consultant - 2 wheeled Level of assistance: Occasional min A Comments: mod VC to stay close to walker, take longer steps.  FUNCTIONAL TESTS:  5 times sit to stand: 38.23 Timed up and go (TUG): 47.91  with RW, 07/21/22 TUG 29 seconds without  device  TODAY'S TREATMENT:                                                                                                                               DATE:  08/09/22 NuStep L5 x 6 minutes Functional status re-assessed for PN-see goals. Balance training-standing on Air ex pad in parallel bars, rotation  to L, holding therapist's hand only for balance, up to min A.   08/04/22 Met him at the car, helped with the transfer out of the car, then walked outside with cues for big smooth steps [place] Bike level 3 x 6 minutes Nustep Level 5 x 5 minutes Gait outside down and up two curbs CGA a lot of cues for steps and to slow down Sit to stand with a lot of cues for nose over toes Seated volleyball, some standing with knees touching bed In pbars sides stepping, backward walking, marching When leaving walked out and around the [place] again to the car, much more assist and cues as he was tired and started shuffling Some bed mobility in a single room without distractions  08/02/22 NuStep L5 x 6 minutes Supine rotational stretch to trunk, knees in hooklying, rotate to L, reach with L arm to R and back x 5, repeat to the R. Roll to L side lie, reach with R arm into rotation forward and back, tried on L, but his L hip hurt in side lie. Seated trunk rotation to R, reach with L hand, sliding out on mat and back x 5, repeat to R. Seated forward flexion over physioball 5 x 3 sec holds Ambulation with 2# WATE bar held at chest level, emphasizing long steps, U UE support. 35' B side stepping in parallel bars, on airex plank with BUE support, 3 x each direction. Walking in figure 8 around 2 cones on the floor, facing 1 direction at all times to ensure changing directions, CGA, max VC, mildly unsteady. Alternating step taps on 2, 6" steps for balance.  07/28/22 NuStep L3 x 6 minutes. Stand with back to corner. Use BUE to lift red physioball and rotate to R to touch target on wall, return to  cneter, then repeat to L. Walk with ball, reaching straight up to touch overhead target without LOB, CGA for both. Side to side step over 2" obstacle on the floor. He was able to move side to side, but was not able to follow the commands for safe performance. Step over air ex planks on the floor, using R, then L legs, BUE support on parallel bars. Stable Climbing up and down steps using step over step technique. Required cues to move L hand along with his feet. 2# weight in BUE, reach down to knee height to touch mat, turn to R and up to touch target on wall. 10 x in each direction. Good reaching above shoulder height, needed cues to engage L hand.  07/26/22 Nustep Level 5 x 6 minutes Sit to stand with various heights x 5 each a lot of verbal cues needed and at times some assist Volley ball sitting and standing, some ball kicking Gait without device 3x 150 feet with cues for step length and speed also distractions In Pbars side steps, big steps and backward steps In pbars head and trunk rotations Sitting and standing reaching for cones iwht each hand Supine rolling to sides, feet on ball trunk rotation, bridges Bridges without ball Sitting trunk elongations with reaching and twisting Holding onto rail 12 inch toe touches  07/21/22 Nustep level 5 x 6 minutes TUG 29 seconds without device SBA Volleyball sitting, standing with legs touching table and then without Partial sit ups 6" toe touches Cone touches working on weight shifts, body turns and reaching for balance 4" step ups, close CGA and at times mod A due to LOB, needed a lot of  redirecting to stay on task as he is easily distracted Ball kicks with back of knees touching the table Side stepping Walking without device.  Outside around [place] and to his car with wife following with w/c  07/19/22:   Nustep L5 5 min, for tissue perfusion, endurance, coordination At steps in smaller room:  Standing for toe raises 10x Tandem standing  forward foot on airex, with trunk rotation to each side, to focus on visual cue( waved to his wife) attempted to perform without UE support  Full turn R and L , x 2, with patient cued to place himself in front of mirror-mirror used for feedback to improve spatial awareness In ll bars  forward steps over airex balance beam  4 lengths, with 2 airex balance beams , cues to increase step length particularly on L Standing lateral step overs airex balance beam cues to place both feet over, patient tends to avoid placing second foot down  Seated for forward rolls with 65 cm therapeutic ball , with partial standing, 10 reps  Seated high marching with alternate arm punches, one 30 sec bout, patinet unable to really complete effectively Standing at steps with 21/2# cuff weight on each leg, for hip flex/ext and hip abd, 10 x each Gait without device 2 separate sets, 100'. Verbal cues to elongate step length B  07/15/22: Neuromuscular re education:   Nustep L5 5 min, for tissue perfusion, endurance, coordination At steps in smaller room:  Standing for toe raises 10x  Full turn R and L , x 2, with patient cued to place himself in front of mirror-mirror used for feedback to improve spatial awareness Standing for forward/back steps over airex balance beam 10x each leg, cues to increase step length particularly on L Standing lateral step overs airex balance, cues to place both feet over, patient tends to avoid placing second foot down Seated for forward rolls with 65 cm therapeutic ball , with partial standing, 10 reps Standing for bouncing large ball back and forth with patient's wife, facing R and facing L  Seated high march overs with each leg, over cuff weight on floor Standing at steps with 21/2# cuff weight on each leg, for hip flex/ext and hip abd, 10 x each Gait without device 2 separate sets, 100'. Verbal cues to elongate step length B  PATIENT EDUCATION: Education details: POC Person educated:  Patient and Spouse Education method: Explanation Education comprehension: verbalized understanding  HOME EXERCISE PROGRAM: TBD  GOALS: Goals reviewed with patient? Yes  SHORT TERM GOALS: Target date: 07/19/22  I with initial HEP Baseline: Goal status: ongoing  2. Decrease 5 x STS to < 25 sec to demonstrate improved functional strength  Baseline: 38 sec  Goal status: 08/09/22 14 sec-met  3. Decrease TUG to < 35 sec to demonstrate improved functional balance  Baseline: 47 sec  Goal status: met 07/21/22  4. Perform sit <> stand transfers from average chair height with S and VC to be able to use the swivel chair at his dining table.  Baseline: Min A  Goal status: 08/09/22-Inconsistently performs  with S from average height. At times requires CGA to cue forward weight shift. ongoing  LONG TERM GOALS: Target date: 09/29/22  Patient will be able to step over his tub at home using grab bars for stability with CGA to access his shower. Baseline: Unable- has bench, but wife reports he steps over then sits. Goal status: ongoing 08/09/22  2.  Decrease 5 x STS to <  18 sec to demonstrate improved functional strength Baseline:  Goal status: 15-2/26/24-met  3.  Decrease TUG to < 25 sec to demonstrate improved functional balance Baseline:  Goal status: 08/09/22  19 sec-met  4.  Patient will perform his bed mobility with MI. Baseline: Min A Goal status: 08/09/22- I as demo on mat-met  5.  Patient will ambulate at least 400' with LRAD, CGA, demonstrate improved upright posture, longer step length. Baseline:  Goal status: 08/09/22-Amb 400', but began to fatigue after about 300' with more flexed posture, L foot shuffling more, unable to maintain safe technique. ongoing  6.  Patient will manage up and down a single step with LRAD, CGA to access his deck at home. Baseline: Unable Goal status: 08/09/22-Up and down 1 step with U rail. ongoing  ASSESSMENT:  CLINICAL IMPRESSION: Patient reports no  falls. Functional status re-assessed for PN. He demonstrates improved speed of movement with TUG and 5 X STS. His ambulation also improved with more upright posture and slightly less shuffle with L foot, until he fatigues.  OBJECTIVE IMPAIRMENTS: Abnormal gait, decreased activity tolerance, decreased balance, decreased cognition, decreased coordination, decreased endurance, decreased mobility, difficulty walking, decreased ROM, decreased strength, decreased safety awareness, impaired flexibility, impaired UE functional use, improper body mechanics, and postural dysfunction.   ACTIVITY LIMITATIONS: carrying, lifting, bending, sitting, standing, squatting, stairs, transfers, bed mobility, bathing, toileting, dressing, hygiene/grooming, and locomotion level  PARTICIPATION LIMITATIONS: meal prep, cleaning, laundry, interpersonal relationship, shopping, and community activity  PERSONAL FACTORS: Age, Past/current experiences, and 3+ comorbidities: CVA, Parkinson's traits, dementia  are also affecting patient's functional outcome.   REHAB POTENTIAL: Good  CLINICAL DECISION MAKING: Evolving/moderate complexity  EVALUATION COMPLEXITY: Moderate  PLAN:  PT FREQUENCY: 2x/week  PT DURATION: 12 weeks  PLANNED INTERVENTIONS: Therapeutic exercises, Therapeutic activity, Neuromuscular re-education, Balance training, Gait training, Patient/Family education, Self Care, Joint mobilization, Stair training, Moist heat, and Manual therapy  PLAN FOR NEXT SESSION: Trunk and extremity mobilization, balance, strength, functional mobility re-education.  Ethel Rana DPT 08/09/22 3:42 PM

## 2022-08-10 ENCOUNTER — Ambulatory Visit: Payer: PPO | Admitting: Occupational Therapy

## 2022-08-10 ENCOUNTER — Telehealth: Payer: Self-pay

## 2022-08-10 DIAGNOSIS — I69318 Other symptoms and signs involving cognitive functions following cerebral infarction: Secondary | ICD-10-CM

## 2022-08-10 DIAGNOSIS — R41840 Attention and concentration deficit: Secondary | ICD-10-CM

## 2022-08-10 DIAGNOSIS — M6281 Muscle weakness (generalized): Secondary | ICD-10-CM

## 2022-08-10 DIAGNOSIS — I69354 Hemiplegia and hemiparesis following cerebral infarction affecting left non-dominant side: Secondary | ICD-10-CM

## 2022-08-10 DIAGNOSIS — R278 Other lack of coordination: Secondary | ICD-10-CM | POA: Diagnosis not present

## 2022-08-10 NOTE — Telephone Encounter (Signed)
-----   Message from Pieter Partridge, DO sent at 08/06/2022  1:33 PM EST ----- The DaT scan result is equivocal.  Two options going forward: 1. Try a trial of the parkinson's medication to see if it helps with movement or 2.  When he is no longer on both aspirin and plavix and instead on just plavix, we can do the skin biopsy.  He may remain on plavix for the skin biopsy.

## 2022-08-10 NOTE — Therapy (Signed)
OUTPATIENT OCCUPATIONAL THERAPY TREATMENT NOTE  Patient Name: William [name] MRN: [id number] DOB:1941-03-14, 82 y.o., male Today's Date: 08/11/2022  PCP & REFERRING PROVIDER:  Carollee Herter, Alferd Apa, DO    END OF SESSION:  OT End of Session - 08/11/22 0811     Visit Number 5    Number of Visits 12    Date for OT Re-Evaluation 08/20/22    Authorization Type [employer]    OT Start Time [id number]    OT Stop Time 1530    OT Time Calculation (min) 42 min    Activity Tolerance Patient tolerated treatment well;Patient limited by fatigue;Patient limited by lethargy;Other (comment);No increased pain   Poor cognition and complex presentation   Behavior During Therapy Impulsive               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   HTN (hypertension) 07/19/2015   Hyperlipidemia    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   Mild, related to stroke history   Nocturia    Renal insufficiency 06/25/2013   S/P carotid endarterectomy    BILATERAL ICA--  PATENT PER DUPLEX  05-19-2012   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/26/2017   Right calf   Urinary frequency    Vitamin D deficiency    Past Surgical History:  Procedure Laterality Date   APPENDECTOMY  AS CHILD   CARDIOVASCULAR STRESS TEST  03-27-2012  DR CRENSHAW   LOW RISK LEXISCAN STUDY-- PROBABLE NORMAL PERFUSION AND SOFT TISSUE ATTENUATION/  NO ISCHEMIA/ EF 51%   CAROTID ENDARTERECTOMY Bilateral LEFT  11-12-2008  DR GREG HAYES   RIGHT ICA  2006  ([facility])   CHOLECYSTECTOMY N/A 02/23/2022   Procedure:  LAPAROSCOPIC CHOLECYSTECTOMY;  Surgeon: Felicie Morn, MD;  Location: [facility];  Service: General;  Laterality: N/A;   CYSTOSCOPY W/ RETROGRADES Bilateral 06/22/2021   Procedure: CYSTOSCOPY WITH RETROGRADE PYELOGRAM;  Surgeon: Franchot Gallo, MD;  Location: [facility];  Service: Urology;  Laterality: Bilateral;   CYSTOSCOPY WITH LITHOLAPAXY N/A 02/26/2013   Procedure: CYSTOSCOPY WITH LITHOLAPAXY;  Surgeon: Franchot Gallo, MD;  Location: [facility];  Service: Urology;  Laterality: N/A;   ENDOSCOPIC RETROGRADE CHOLANGIOPANCREATOGRAPHY (ERCP) WITH PROPOFOL N/A 02/22/2022   Procedure: ENDOSCOPIC RETROGRADE CHOLANGIOPANCREATOGRAPHY (ERCP) WITH PROPOFOL;  Surgeon: Carol Ada, MD;  Location: [place] [facility];  Service: Gastroenterology;  Laterality: N/A;   EYE SURGERY  Jan. 2016   cataract surgery both eyes   INGUINAL HERNIA REPAIR Right 11-08-2006   IR KYPHO EA ADDL LEVEL THORACIC OR LUMBAR  02/12/2021   IR RADIOLOGIST EVAL & MGMT  02/18/2021   MASS EXCISION N/A 03/03/2016   Procedure: EXCISION OF BACK  MASS;  Surgeon: Stark Klein, MD;  Location: [facility];  Service: General;  Laterality: N/A;   MOHS SURGERY Left 1/ 2016   Dr Nevada Crane-- Basal cell   PROSTATE SURGERY     REMOVAL OF STONES  02/22/2022   Procedure: REMOVAL OF STONES;  Surgeon: Carol Ada, MD;  Location:  [facility] ENDOSCOPY;  Service: Gastroenterology;;   [name]  02/22/2022   Procedure: [name];  Surgeon: Carol Ada, MD;  Location: [facility] ENDOSCOPY;  Service: Gastroenterology;;   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-C N/A 06/22/2021   Procedure: TRANSURETHRAL RESECTION OF BLADDER TUMOR;  Surgeon: Franchot Gallo, MD;  Location: [facility];  Service: Urology;  Laterality: N/A;   TRANSURETHRAL RESECTION OF PROSTATE N/A 02/26/2013   Procedure: TRANSURETHRAL RESECTION OF THE PROSTATE WITH GYRUS INSTRUMENTS;  Surgeon: Franchot Gallo, MD;  Location:  [facility];  Service: Urology;  Laterality: N/A;   TRANSURETHRAL RESECTION OF PROSTATE N/A 06/22/2021   Procedure: TRANSURETHRAL RESECTION OF THE PROSTATE (TURP);  Surgeon: Franchot Gallo, MD;  Location: [facility];  Service: Urology;  Laterality: N/A;   Patient Active Problem List   Diagnosis Date Noted   Dysuria 08/03/2022   Nonintractable headache 07/01/2022   Bilateral impacted cerumen 06/24/2022   Rash 06/15/2022   Postoperative ileus ([place]) 02/27/2022   Ileus, postoperative ([place]) 02/26/2022   Choledocholithiasis 02/19/2022   DNR (do not resuscitate) 02/19/2022   Left elbow pain 01/26/2022   Mid back pain on left side 01/26/2022   Rib pain 01/26/2022   Acute pain of left shoulder 11/12/2021   Leukocytes in urine 11/12/2021   Urinary frequency 11/12/2021   Thrush 10/08/2021   Hemiplegia, dominant side S/P CVA (cerebrovascular accident) ([place]) 09/11/2021   Insomnia    Prediabetes    Acute 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 ([place])    Basal ganglia infarction ([place]) 07/29/2021   Transaminitis 07/27/2021   UTI (urinary tract infection) 07/27/2021   CVA (cerebral vascular accident) ([place]) 07/27/2021   Fall 07/27/2021   Hyperglycemia 07/27/2021   Cholelithiasis 07/27/2021   Hypoxia 07/27/2021   Nausea and vomiting [id number]   Acute metabolic encephalopathy [id number]   Normocytic anemia 07/27/2021   Chronic back pain 07/27/2021   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[place]) 06/22/2021   Closed fracture of first lumbar vertebra with routine healing 02/03/2021   Closed fracture of multiple ribs 11/18/2020   Anxiety 01/29/2020   Leg pain, bilateral 01/29/2020   Ingrown toenail 07/13/2019   Lumbar spondylosis 05/02/2018   Pain in left knee 03/09/2018   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   Trochanteric bursitis of left hip 01/16/2018   Preventative health care 09/26/2017   HTN (hypertension) 07/19/2015    Hyperlipidemia 07/19/2015   Great toe pain 02/11/2014   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   Obesity (BMI 30-39.9) 06/25/2013   Renal insufficiency 06/25/2013   Weakness of left arm 06/25/2013   Sebaceous cyst 03/03/2011   Sprain of lumbar region 07/31/2010   Rib pain, left 08/29/2009   Carotid artery stenosis, asymptomatic, bilateral 05/02/2009   Eczema, atopic 05/31/2008   Vitamin D deficiency 03/01/2008   BPH (benign prostatic hyperplasia) 08/06/2007   Fasting hyperglycemia 12/21/2006   History of right MCA infarct 2006    ONSET DATE: Acute exacerbation of chronic symptoms (most recent hospitalization 02/26/22 - 03/12/22, followed by SNF and home health)   REFERRING DIAG:  I63.9 (ICD-10-CM) - Cerebrovascular accident (CVA), unspecified mechanism ([place])  W19.XXXD (ICD-10-CM) - Fall, subsequent encounter    THERAPY DIAG:  Other lack of coordination  Muscle weakness (generalized)  Hemiplegia and hemiparesis following cerebral infarction affecting left non-dominant side (HCC)  Attention and concentration deficit  Other symptoms and signs involving cognitive functions following cerebral infarction  Rationale for Evaluation and Treatment: Rehabilitation  PERTINENT HISTORY: PMHx  includes L basal ganglia and corona radiata infarct 07/27/21, L lower homonymous quadrantanopia; R MCA infarct in 2006 w/ L-sided hemiparesis, NCD, carotid artery disease (bilateral), HTN, HLD,CKD3, vascular dementia, and hx of bladder cancer. He has had falls in 2023, fracturing his clavicle in August, then he had stomach surgery in Sept 2023. He has He needs assist at baseline for ADLs. When last documented in acute rehab after having his gall-bladder removed, (03/09/22) he required moderate to maximum assist x2 for most ADLs (more assist for ADLs the require standing), and they recommended rehab in skilled nursing setting. He apparently got Covid at some point and also attended a SNF. Once he D/C  home, he had some episodes of combativeness at night  with "sun-downing."  He is communicative and appropriate, though appears easily confused and distracted, not fully oriented to time and place (Ax2, states date is 1 and cannot say what city he lives in). His wife provides most details, and he is participative, though a poor historian at times. In a nut shell- he has been suffering from dementia and post-stroke effects for years, and his issues were recently exacerbated in Sept of last year after abdominal surgery. He has less use of Lt "stroke side" and new, ritualistic "ticks" in mouth and Rt hand/arm.  Wife states that they do have a caregiver come M, W, F for 4 hours in the middle of the day to mainly help him bathe and dress, which usually tires him out. He has been more combative towards the end of the day as well.   PRECAUTIONS: Fall and Other: Lt hemi, poor cognition, LATEX and adhesive allergies, etc.  ;WEIGHT BEARING RESTRICTIONS: No   SUBJECTIVE:   SUBJECTIVE STATEMENT: Denies pain  Pt accompanied by:  his wife  PAIN:  Are you having pain? Not now at rest Rating: 0/10 at rest now   FALLS: Has patient fallen in last 6 months? Yes. Number of falls at least 1 resulting in clavicle fx (now healed)   LIVING ENVIRONMENT: Lives with: lives with their spouse Lives in: House/apartment Has following equipment at home: manual w/c, 2 w/w, weighted utensils, shower bench (for tub), bed cane, hospital bed, raised toilet, and possibly more  PLOF: Needs assistance with ADLs, Needs assistance with homemaking, Needs assistance with gait, and Needs assistance with transfers  PATIENT GOALS: Top caregiver goals today:  #1- increase personal hygiene skills (brush teeth, shaving) #2- prepare instant coffee or microwave meal #3- improve functional cognitive skills to play games, etc.,  #4- increase Lt arm usage #5- try to help decrease Rt hand/arm perseveration (flicking, tapping, noise  making) #6- Improve fnl tranfers in/out of multiple seats (tub bench, swivel chair, etc.)    OBJECTIVE: (All objective assessments below are from initial evaluation on: 07/07/22 unless otherwise specified.)   HAND DOMINANCE: Right  ADLs: Overall ADLs: Wife states decreased ability since hospitalization in Sept 23.  Transfers/ambulation related to ADLs: Min A transfer, CGA for mobility Eating: set up Grooming: Mod A- needs assist with oral care, shaving, but can comb his hair UB Dressing: Min A  LB Dressing: Dep  Toileting:  Mod  Bathing: Mod  Tub Shower transfers: Min A  Equipment: Radio broadcast assistant, Grab bars, and Feeding equipment  IADLs: completely dependent for years now, except some ability to use microwave which is now not possible after hospitalization   MOBILITY STATUS: Needs Assist: CGA - Min A, Hx of falls, Festination, difficulty with turns, and neglect on Lt side  POSTURE COMMENTS:  rounded shoulders, forward head, and weight shift left  ACTIVITY TOLERANCE: Activity tolerance: he became tired and somewhat agitated by the end of the OT evaluation today (after PT and OT about 2.5hours with mostly sedate activities in seated positions)  FUNCTIONAL OUTCOME MEASURES: [facility] 17/30  showing problems with visuospatial/executive function, attention, fluency, and orientation. (at least 26/30 is needed to be considered "normal")  [employer] in ADLs: 1/6  (very dependent)  UPPER EXTREMITY ROM:    Active ROM Right eval Left eval  Shoulder scaption 100 85  Shoulder abduction    Shoulder adduction    Shoulder extension    Shoulder internal rotation Reaches small of back Reaches to hip  Shoulder external rotation Reaches back of head  Reaches up to ear  Elbow flexion 150 130  Elbow extension (-10*)  (-35*)  Wrist flexion 35 20  Wrist extension 50 10  (Blank rows = not tested)  UPPER EXTREMITY MMT:    Tested grossly as "push" and "pull": UE push 4-/5, UE pull  4/5  HAND FUNCTION: Grip strength: Right: 30 lbs; Left: 20 lbs  COORDINATION: Finger Nose Finger test: mildly ataxic but fairly accurate  Box and Blocks:  Right 11blocks, Left 5blocks  MUSCLE TONE:  Rt- normal, Lt- decreased and worsened by inattention/disuse  COGNITION: Overall cognitive status: Impaired and Difficulty to assess due to: severity of deficits; Poor attention, fluency, orientation, and executive function. Recall is fairly good as well as abstraction.  Seems to have personality changes due to vascular dementia- like an OCD type volitional perseveration of tapping with Rt hand that he can somewhat control.  He did become angry and agitated at the end of the session performing car transfer with spouse: he did shout at her.  VISION: Subjective report: no new changes per pt/spouse, uses "cheaters"   VISION ASSESSMENT: Visual fields seem intact though he had latent responses on the left side showing likely inattention/and/neglect, tracking somewhat delayed horizontally and vertically  PERCEPTION: Impaired: Inattention/neglect: does not attend to left visual field and does not attend to left side of body, Body scheme: poor quality of recognition with shaving, etc., and Spatial orientation: decreased in print and in orientation, especially to Lt side  PRAXIS: Not tested specifically but no overt/stated issues using pen, wearing glasses ,etc.   OBSERVATIONS: Pt taps and flicks and does seem to volitionally perseverate on making a rhythm/noise with Rt hand. He states he is aware of it and chooses to do it.  He does also seem to have mild resting and intention tremor.    TODAY'S TREATMENT:                                                                                                                              DATE: 08/10/22-Pt ambulated to OT treatment room from lobby with close supervision- min guard for larger step length, shuffling gait present. Seated on mat with mirror in  front pt  performed closed chain chest press, shoulder flexion and L abduction with min-mod facilitation and v.c Functional reaching grasp checkers on left and to place checkers in connect 4 frame with LUE, min-mod facilitation/ v.c, for performance, and opening left hand to grab items.  Placing large pegs in pegboard with LUE mod difficulty, facilitation and v.c Pt's wife held RUE during functional activities to encourage increased use of LUE. Arm bike x 4 mins level 1 for conditioning/ reciprocal movement. Pt was able to maintain left grip for task.         08/02/22: He starts by trying to reach overhead for shoulder range of motion, but the left shoulder is stiff and tight.  He states having some discomfort in the left shoulder, so OT review shoulder stretches with him and his spouse.  He does this is seated reaching across a table for flexion stretch also in abduction and external rotation.  These are a review for them.  Next to increase use of the left side, OT positions him with a table to his left and he performs multiple functional activities with pegs, games like next 4, reaching out to the left.  His right hand is needed to be held down during this at times.  Next he performs other bimanual task or forced use of his left hand for example he uses his left hand to wash his right hand as he cannot do that with the right.  OT uses mirrors that time and verbal and physical cues to give feedback to him on his performance and educates that sometimes verbal cues can be considered aggressive by the person and the silent physical cues might be tolerated better when the person has anxiety.    07/28/22: Today occupational therapy focused on their personal goals of: Functional transfers, functional cognition to play game, microwave usage, and increased left arm usage.  He starts by performing sit to stand and walking to the kitchen area and having seated at a table.  OT then facilitates the process of him  reading and attempting to comprehend the directions on a packet of oatmeal, collecting the necessary ingredients safely with functional mobility, safe use of microwave and disposal of trash and cleaning up.  He did have some safety concerns with selecting the incorrect time on the microwave and risk of potential burn with hot bowl that required intervention from the OT.  Otherwise he needed cues for his memory for some of the steps of the process and close supervision for functional mobility.  Next he walks to the therapy room and performs a seated game activity, with bimanual tasks and shuffling cards, cognitive tasks for following directions and taking turns, left hand forced use by OT holding his right hand.  OT also attempted to place objects on the left side of his body to improve awareness of the left side.  At the end of the session OT helps him perform another functional transfer and get into his vehicle with contact-guard assist.   07/21/22: OT provides caregiver education for adapting home environments to increase safety or awareness for the patient.  This includes things like putting alarming or alerting devices to his left side to increase awareness of the left side, removing clutter and distractions and simplifying the environment, having orienting materials posted with an eye shot, and others.  His caregiver does state doing some of these already.  Additionally she was given paperwork on strategies for specific functional activities like hygiene and shaving and she does state  getting him an Copy and it has helped somewhat.  She states that he is better at shaving with it.  They are also given the name of a neuropsychologist who specializes in dementia with him they could get a consultation or second opinion if they desired.  Next to improve the use of the left arm, OT gives him the following home exercise program for bilateral arm use on a wand or cane or dowel rod.  He needs verbal and  physical cues as he neglects his left side significantly still.  OT also utilizes certain visual problems or targets to reach towards as his effort and his attention quickly wanes during these exercises.  When cued to "tap the wall" he reaches out or performs better versus "moves side-to-side."  These type of targeting strategies were emphasized to his caregiver and she sees the benefit and states understanding.  Exercises - Seated Shoulder Flexion AAROM with Dowel  - 4-6 x daily - 1 sets - 10-15 reps - Seated Shoulder Abduction AAROM with Dowel  - 4-6 x daily - 1 sets - 10-15 reps - Seated Shoulder External Rotation AAROM with Dowel  - 4-6 x daily - 1 sets - 10-15 reps - Seated Bicep Curls with Bar  - 4-6 x daily - 1 sets - 10-15 reps - Seated Shoulder Row with Anchored Resistance  - 4-6 x daily - 1 sets - 10-15 reps   PATIENT EDUCATION: Education details: See treatment section above for details Person educated: Patient and Spouse Education method: Explanation, Demonstration, Tactile cues, and Verbal cues Education comprehension: verbalized understanding, returned demonstration, verbal cues required, tactile cues required, and needs further education  HOME EXERCISE PROGRAM: Access Code: [email or web address] URL: [email or web address] Date: 07/21/2022 Prepared by: Benito Mccreedy    GOALS: Goals reviewed with patient? Yes   SHORT TERM GOALS: (STG required if POC>30 days) Target Date: 07/23/22   Pt will demo/state understanding of initial HEP (or home activity suggestions to maintain function as appropriate) to improve functional skills. Goal status: 07/28/22- Met (addressed in past 2 sessions)   LONG TERM GOALS: Target Date: 08/20/22  Pt will improve functional ability by decreased impairment per Redmond Baseman assessment from 1/6 to 2/6 or better, for better quality of life. Goal status:  ongoing  2.  Caregiver will state his grooming ability/hygiene ability improved from  moderate assist to supervision assist. Goal status: ongoing  3.  He will show better functional use of left arm by improved box and blocks Test score from 5 blocks to at least 10 blocks. Goal status:ongoing  4.  Pt will show ability to microwave instant coffee or small meal safely, to help decrease caregiver burden. Goal status: ongoing- 08/10/22-pt's wife reports pt is performing with supervision at home  5.  He will show improved cognitive ability enough to play a simple game with OT and caregiver. Goal status: ongoing  6.  Caregiver will state at least 4 ways to decrease perseveration with right arm, also stating that this behaviors at least somewhat better in the home now. Goal status: ongoing   ASSESSMENT:  CLINICAL IMPRESSION: Pt is progressing towards goals with improving LUE functional use. Pt's wife report he is doing well with making his own microwave coffee at home.  OT DURATION: 6 weeks (through 08/20/22 as needed)   PLANNED INTERVENTIONS: self care/ADL training, therapeutic exercise, therapeutic activity, neuromuscular re-education, manual therapy, balance training, functional mobility training, moist heat, cryotherapy, patient/family education, cognitive remediation/compensation, visual/perceptual remediation/compensation, psychosocial skills training, energy  conservation, coping strategies training, DME and/or AE instructions, and Re-evaluation  RECOMMENDED OTHER SERVICES: He has already seen PT for balance and functional mobility, he would probably benefit from speech for cognitive issues as well, though OT will be working with cognition somewhat functionally now.  CONSULTED AND AGREED WITH PLAN OF CARE: Patient and family member/caregiver  PLAN FOR NEXT SESSION:  Continue working towards  long term goals, functional use of LUE Phone: [phone] 8:22 AM 08/11/22  08/11/2022, 8:22 AM

## 2022-08-10 NOTE — Telephone Encounter (Signed)
Per patient wife she declines both options for right now.   Will call if his symptoms get worse.

## 2022-08-11 ENCOUNTER — Encounter: Payer: Self-pay | Admitting: Physical Therapy

## 2022-08-11 ENCOUNTER — Ambulatory Visit: Payer: PPO | Admitting: Occupational Therapy

## 2022-08-11 ENCOUNTER — Ambulatory Visit: Payer: PPO | Admitting: Physical Therapy

## 2022-08-11 DIAGNOSIS — Z9181 History of falling: Secondary | ICD-10-CM

## 2022-08-11 DIAGNOSIS — I69354 Hemiplegia and hemiparesis following cerebral infarction affecting left non-dominant side: Secondary | ICD-10-CM

## 2022-08-11 DIAGNOSIS — M6281 Muscle weakness (generalized): Secondary | ICD-10-CM

## 2022-08-11 DIAGNOSIS — R296 Repeated falls: Secondary | ICD-10-CM

## 2022-08-11 DIAGNOSIS — R278 Other lack of coordination: Secondary | ICD-10-CM | POA: Diagnosis not present

## 2022-08-11 DIAGNOSIS — R262 Difficulty in walking, not elsewhere classified: Secondary | ICD-10-CM

## 2022-08-11 NOTE — Therapy (Signed)
OUTPATIENT PHYSICAL THERAPY NEURO Treatment   Patient Name: William [name] MRN: [id number] DOB:February 03, 1941, 82 y.o., male Today's Date: 08/11/2022  PCP: Lowne Chase, [place] PROVIDER: same  END OF SESSION:  PT End of Session - 08/11/22 1535     Visit Number 11    Date for PT Re-Evaluation 09/29/22    PT Start Time 1529    PT Stop Time 1615    PT Time Calculation (min) 46 min    Equipment Utilized During Treatment Gait belt    Activity Tolerance Patient tolerated treatment well    Behavior During Therapy Impulsive             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   HTN (hypertension) 07/19/2015   Hyperlipidemia    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   Mild, related to stroke history   Nocturia    Renal insufficiency 06/25/2013   S/P carotid endarterectomy    BILATERAL ICA--  PATENT PER DUPLEX  05-19-2012   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/26/2017   Right calf   Urinary frequency    Vitamin D deficiency    Past Surgical History:  Procedure Laterality Date   APPENDECTOMY  AS CHILD   CARDIOVASCULAR STRESS TEST  03-27-2012  DR CRENSHAW   LOW RISK LEXISCAN STUDY-- PROBABLE NORMAL PERFUSION AND SOFT TISSUE ATTENUATION/  NO ISCHEMIA/ EF 51%   CAROTID ENDARTERECTOMY Bilateral LEFT  11-12-2008  DR GREG HAYES   RIGHT ICA  2006  ([facility])   CHOLECYSTECTOMY N/A 02/23/2022   Procedure: LAPAROSCOPIC CHOLECYSTECTOMY;  Surgeon: Felicie Morn, MD;  Location: [place];  Service: General;  Laterality: N/A;   CYSTOSCOPY W/ RETROGRADES  Bilateral 06/22/2021   Procedure: CYSTOSCOPY WITH RETROGRADE PYELOGRAM;  Surgeon: Franchot Gallo, MD;  Location: [place];  Service: Urology;  Laterality: Bilateral;   CYSTOSCOPY WITH LITHOLAPAXY N/A 02/26/2013   Procedure: CYSTOSCOPY WITH LITHOLAPAXY;  Surgeon: Franchot Gallo, MD;  Location: [facility];  Service: Urology;  Laterality: N/A;   ENDOSCOPIC RETROGRADE CHOLANGIOPANCREATOGRAPHY (ERCP) WITH PROPOFOL N/A 02/22/2022   Procedure: ENDOSCOPIC RETROGRADE CHOLANGIOPANCREATOGRAPHY (ERCP) WITH PROPOFOL;  Surgeon: Carol Ada, MD;  Location: [place];  Service: Gastroenterology;  Laterality: N/A;   EYE SURGERY  Jan. 2016   cataract surgery both eyes   INGUINAL HERNIA REPAIR Right 11-08-2006   IR KYPHO EA ADDL LEVEL THORACIC OR LUMBAR  02/12/2021   IR RADIOLOGIST EVAL & MGMT  02/18/2021   MASS EXCISION N/A 03/03/2016   Procedure: EXCISION OF BACK  MASS;  Surgeon: Stark Klein, MD;  Location: [place];  Service: General;  Laterality: N/A;   MOHS SURGERY Left 1/ 2016   Dr Nevada Crane-- Basal cell   PROSTATE SURGERY     REMOVAL OF STONES  02/22/2022   Procedure: REMOVAL OF STONES;  Surgeon: Carol Ada, MD;  Location: [place];  Service: Gastroenterology;;   [name]  02/22/2022   Procedure: [name];  Surgeon: Carol Ada, MD;  Location: [place];  Service: Gastroenterology;;   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-C N/A 06/22/2021   Procedure: TRANSURETHRAL RESECTION OF BLADDER TUMOR;  Surgeon: Franchot Gallo, MD;  Location: [facility];  Service: Urology;  Laterality: N/A;   TRANSURETHRAL RESECTION OF PROSTATE N/A 02/26/2013   Procedure: TRANSURETHRAL RESECTION OF THE PROSTATE WITH GYRUS INSTRUMENTS;  Surgeon: Franchot Gallo, MD;  Location: [facility];  Service: Urology;  Laterality: N/A;   TRANSURETHRAL RESECTION OF PROSTATE N/A 06/22/2021   Procedure: TRANSURETHRAL  RESECTION OF THE PROSTATE (TURP);  Surgeon: Franchot Gallo, MD;  Location: [facility];  Service: Urology;  Laterality: N/A;   Patient Active Problem List   Diagnosis Date Noted   Dysuria 08/03/2022   Nonintractable headache 07/01/2022   Bilateral impacted cerumen 06/24/2022   Rash 06/15/2022   Postoperative ileus ([place]) 02/27/2022   Ileus, postoperative ([place]) 02/26/2022   Choledocholithiasis 02/19/2022   DNR (do not resuscitate) 02/19/2022   Left elbow pain 01/26/2022   Mid back pain on left side 01/26/2022   Rib pain 01/26/2022   Acute pain of left shoulder 11/12/2021   Leukocytes in urine 11/12/2021   Urinary frequency 11/12/2021   Thrush 10/08/2021   Hemiplegia, dominant side S/P CVA (cerebrovascular accident) ([place]) 09/11/2021   Insomnia    Prediabetes    Acute 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 ([place])    Basal ganglia infarction ([place]) 07/29/2021   Transaminitis 07/27/2021   UTI (urinary tract infection) 07/27/2021   CVA (cerebral vascular accident) ([place]) 07/27/2021   Fall 07/27/2021   Hyperglycemia 07/27/2021   Cholelithiasis 07/27/2021   Hypoxia 07/27/2021   Nausea and vomiting [id number]   Acute metabolic encephalopathy [id number]   Normocytic anemia 07/27/2021   Chronic back pain 07/27/2021   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[place]) 06/22/2021   Closed fracture of first lumbar vertebra with routine healing 02/03/2021   Closed fracture of multiple ribs 11/18/2020   Anxiety 01/29/2020   Leg pain, bilateral 01/29/2020   Ingrown toenail 07/13/2019   Lumbar spondylosis 05/02/2018   Pain in left knee 03/09/2018   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   Trochanteric bursitis of left hip 01/16/2018   Preventative health care 09/26/2017   HTN (hypertension) 07/19/2015   Hyperlipidemia 07/19/2015   Great toe pain 02/11/2014   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   Obesity (BMI 30-39.9) 06/25/2013    Renal insufficiency 06/25/2013   Weakness of left arm 06/25/2013   Sebaceous cyst 03/03/2011   Sprain of lumbar region 07/31/2010   Rib pain, left 08/29/2009   Carotid artery stenosis, asymptomatic, bilateral 05/02/2009   Eczema, atopic 05/31/2008   Vitamin D deficiency 03/01/2008   BPH (benign prostatic hyperplasia) 08/06/2007   Fasting hyperglycemia 12/21/2006   History of right MCA infarct 2006    ONSET DATE: 06/29/22  REFERRING DIAG:  Diagnosis  I63.9 (ICD-10-CM) - Cerebrovascular accident (CVA), unspecified mechanism ([place])  W19.XXXD (ICD-10-CM) - Fall, subsequent encounter    THERAPY DIAG:  Other lack of coordination  Muscle weakness (generalized)  Hemiplegia and hemiparesis following cerebral infarction affecting left non-dominant side (HCC)  History of falling  Difficulty in walking, not elsewhere classified  Repeated falls  Rationale for Evaluation and Treatment: Rehabilitation  SUBJECTIVE:  SUBJECTIVE STATEMENT: No falls, doing well. No c/o pain, wife reports that he is doing better with his car transfer PERTINENT HISTORY:  RONDELL JOHNKE is a 82 year old man with dementia, CKD, HTN, CAD, HLD and history of CVA  (2006, 2021), Parkinson's  PAIN:  Are you having pain? No  PRECAUTIONS: Fall  WEIGHT BEARING RESTRICTIONS: No  FALLS: Has patient fallen in last 6 months? No  LIVING ENVIRONMENT: Lives with: lives with their family and lives with their spouse Lives in: House/apartment Stairs:  1 brick high step Has following equipment at home: Environmental consultant - 2 wheeled, Wheelchair (manual), Shower bench, bed side commode, Grab bars, and hospital bed, sliding pad for car  PLOF: Independent with basic ADLs and Independent with household mobility without device  PATIENT GOALS: Walk  around the block with his wife. Be able to use the dining room chair rather than have to use the W/C. Increased I with bed mobility and simple tasks at home like make some coffee or brush his teeth, Step over tub to shower, has bench, but he can't really use it. Up and down the one step to get to back deck.  OBJECTIVE:   DIAGNOSTIC FINDINGS:  MRI 2021 with degenerative changes in lumbar spine, L3-4 R subarticular stenosis  COGNITION: Overall cognitive status: History of cognitive impairments - at baseline   SENSATION: Not tested  COORDINATION: Moderately impaired BLE   MUSCLE LENGTH: Hamstrings: severely restricted B Thomas test: Severely restricted B.  POSTURE: rounded shoulders, forward head, increased thoracic kyphosis, posterior pelvic tilt, and flexed trunk   LOWER EXTREMITY ROM:   BLE extremely stiff throughout, limited hip ROM in all planes, B knees limited in extension.   LOWER EXTREMITY MMT:  3+/5 throughout. Unable to determine accuracy of MMT due to cognition. Functionally demosntrates poor coordinated activation and poor muscular endurance.   BED MOBILITY: Min A for Supine<> Sit. Patient was using a ladder in his hospital bed and could move MI.  TRANSFERS: Min A, mod VC, tends to lean back.  CURB: Min A-Has one step out to his deck.  STAIRS: N/A   GAIT: Gait pattern: step to pattern, decreased arm swing- Right, decreased arm swing- Left, decreased step length- Right, decreased step length- Left, shuffling, festinating, trunk flexed, narrow BOS, poor foot clearance- Right, and poor foot clearance- Left Distance walked: 38' Assistive device utilized: Environmental consultant - 2 wheeled Level of assistance: Occasional min A Comments: mod VC to stay close to walker, take longer steps.  FUNCTIONAL TESTS:  5 times sit to stand: 38.23 Timed up and go (TUG): 47.91  with RW, 07/21/22 TUG 29 seconds without device  TODAY'S TREATMENT:                                                                                                                                DATE:  08/11/22 Side stepping in pbars Marches, hip abduction, extension Fast walking Stairs 4" and  6" Nustep level 5 x 6 minutes Sit to stand multiple times and multiple ways On airex head turns, reaching Seated volleyball, standing volleyball Seated ball kicks Gait outside around [place] and to the car   08/09/22 NuStep L5 x 6 minutes Functional status re-assessed for PN-see goals. Balance training-standing on Air ex pad in parallel bars, rotation to L, holding therapist's hand only for balance, up to min A.   08/04/22 Met him at the car, helped with the transfer out of the car, then walked outside with cues for big smooth steps around the [place] Bike level 3 x 6 minutes Nustep Level 5 x 5 minutes Gait outside down and up two curbs CGA a lot of cues for steps and to slow down Sit to stand with a lot of cues for nose over toes Seated volleyball, some standing with knees touching bed In pbars sides stepping, backward walking, marching When leaving walked out and around the [place] again to the car, much more assist and cues as he was tired and started shuffling Some bed mobility in a single room without distractions  08/02/22 NuStep L5 x 6 minutes Supine rotational stretch to trunk, knees in hooklying, rotate to L, reach with L arm to R and back x 5, repeat to the R. Roll to L side lie, reach with R arm into rotation forward and back, tried on L, but his L hip hurt in side lie. Seated trunk rotation to R, reach with L hand, sliding out on mat and back x 5, repeat to R. Seated forward flexion over physioball 5 x 3 sec holds Ambulation with 2# WATE bar held at chest level, emphasizing long steps, U UE support. 63' B side stepping in parallel bars, on airex plank with BUE support, 3 x each direction. Walking in figure 8 around 2 cones on the floor, facing 1 direction at all times to ensure changing  directions, CGA, max VC, mildly unsteady. Alternating step taps on 2, 6" steps for balance.  07/28/22 NuStep L3 x 6 minutes. Stand with back to corner. Use BUE to lift red physioball and rotate to R to touch target on wall, return to cneter, then repeat to L. Walk with ball, reaching straight up to touch overhead target without LOB, CGA for both. Side to side step over 2" obstacle on the floor. He was able to move side to side, but was not able to follow the commands for safe performance. Step over air ex planks on the floor, using R, then L legs, BUE support on parallel bars. Stable Climbing up and down steps using step over step technique. Required cues to move L hand along with his feet. 2# weight in BUE, reach down to knee height to touch mat, turn to R and up to touch target on wall. 10 x in each direction. Good reaching above shoulder height, needed cues to engage L hand.  07/26/22 Nustep Level 5 x 6 minutes Sit to stand with various heights x 5 each a lot of verbal cues needed and at times some assist Volley ball sitting and standing, some ball kicking Gait without device 3x 150 feet with cues for step length and speed also distractions In Pbars side steps, big steps and backward steps In pbars head and trunk rotations Sitting and standing reaching for cones iwht each hand Supine rolling to sides, feet on ball trunk rotation, bridges Bridges without ball Sitting trunk elongations with reaching and twisting Holding onto rail 12 inch toe  touches  07/21/22 Nustep level 5 x 6 minutes TUG 29 seconds without device SBA Volleyball sitting, standing with legs touching table and then without Partial sit ups 6" toe touches Cone touches working on weight shifts, body turns and reaching for balance 4" step ups, close CGA and at times mod A due to LOB, needed a lot of redirecting to stay on task as he is easily distracted Ball kicks with back of knees touching the table Side stepping Walking  without device.  Outside around [place] and to his car with wife following with w/c  07/19/22:   Nustep L5 5 min, for tissue perfusion, endurance, coordination At steps in smaller room:  Standing for toe raises 10x Tandem standing forward foot on airex, with trunk rotation to each side, to focus on visual cue( waved to his wife) attempted to perform without UE support  Full turn R and L , x 2, with patient cued to place himself in front of mirror-mirror used for feedback to improve spatial awareness In ll bars  forward steps over airex balance beam  4 lengths, with 2 airex balance beams , cues to increase step length particularly on L Standing lateral step overs airex balance beam cues to place both feet over, patient tends to avoid placing second foot down  Seated for forward rolls with 65 cm therapeutic ball , with partial standing, 10 reps  Seated high marching with alternate arm punches, one 30 sec bout, patinet unable to really complete effectively Standing at steps with 21/2# cuff weight on each leg, for hip flex/ext and hip abd, 10 x each Gait without device 2 separate sets, 100'. Verbal cues to elongate step length B PATIENT EDUCATION: Education details: POC Person educated: Patient and Spouse Education method: Explanation Education comprehension: verbalized understanding  HOME EXERCISE PROGRAM: TBD  GOALS: Goals reviewed with patient? Yes  SHORT TERM GOALS: Target date: 07/19/22  I with initial HEP Baseline: Goal status: ongoing  2. Decrease 5 x STS to < 25 sec to demonstrate improved functional strength  Baseline: 38 sec  Goal status: 08/09/22 14 sec-met  3. Decrease TUG to < 35 sec to demonstrate improved functional balance  Baseline: 47 sec  Goal status: met 07/21/22  4. Perform sit <> stand transfers from average chair height with S and VC to be able to use the swivel chair at his dining table.  Baseline: Min A  Goal status: 08/09/22-Inconsistently performs  with S  from average height. At times requires CGA to cue forward weight shift. ongoing  LONG TERM GOALS: Target date: 09/29/22  Patient will be able to step over his tub at home using grab bars for stability with CGA to access his shower. Baseline: Unable- has bench, but wife reports he steps over then sits. Goal status: ongoing 08/09/22  2.  Decrease 5 x STS to < 18 sec to demonstrate improved functional strength Baseline:  Goal status: 15-2/26/24-met  3.  Decrease TUG to < 25 sec to demonstrate improved functional balance Baseline:  Goal status: 08/09/22  19 sec-met  4.  Patient will perform his bed mobility with MI. Baseline: Min A Goal status: 08/09/22- I as demo on mat-met  5.  Patient will ambulate at least 400' with LRAD, CGA, demonstrate improved upright posture, longer step length. Baseline:  Goal status: 08/09/22-Amb 400', but began to fatigue after about 300' with more flexed posture, L foot shuffling more, unable to maintain safe technique. ongoing  6.  Patient will manage up  and down a single step with LRAD, CGA to access his deck at home. Baseline: Unable Goal status: 08/09/22-Up and down 1 step with U rail. ongoing  ASSESSMENT:  CLINICAL IMPRESSION: Patient a little more distracted today, but more interactive, still really has difficulty sit to stand, he tends to be distracted doing this and cues do not really help.  At times he will walk well and then he will really stumble and drag his feet OBJECTIVE IMPAIRMENTS: Abnormal gait, decreased activity tolerance, decreased balance, decreased cognition, decreased coordination, decreased endurance, decreased mobility, difficulty walking, decreased ROM, decreased strength, decreased safety awareness, impaired flexibility, impaired UE functional use, improper body mechanics, and postural dysfunction.   ACTIVITY LIMITATIONS: carrying, lifting, bending, sitting, standing, squatting, stairs, transfers, bed mobility, bathing, toileting,  dressing, hygiene/grooming, and locomotion level  PARTICIPATION LIMITATIONS: meal prep, cleaning, laundry, interpersonal relationship, shopping, and community activity  PERSONAL FACTORS: Age, Past/current experiences, and 3+ comorbidities: CVA, Parkinson's traits, dementia  are also affecting patient's functional outcome.   REHAB POTENTIAL: Good  CLINICAL DECISION MAKING: Evolving/moderate complexity  EVALUATION COMPLEXITY: Moderate  PLAN:  PT FREQUENCY: 2x/week  PT DURATION: 12 weeks  PLANNED INTERVENTIONS: Therapeutic exercises, Therapeutic activity, Neuromuscular re-education, Balance training, Gait training, Patient/Family education, Self Care, Joint mobilization, Stair training, Moist heat, and Manual therapy  PLAN FOR NEXT SESSION: Trunk and extremity mobilization, balance, strength, functional mobility re-education.  Lum Babe, PT 08/11/22 3:40 PM

## 2022-08-11 NOTE — Therapy (Unsigned)
OUTPATIENT OCCUPATIONAL THERAPY TREATMENT NOTE  Patient Name: William [name] MRN: [id number] DOB:1940/09/03, 82 y.o., male Today's Date: 08/12/2022  PCP & REFERRING PROVIDER:  Carollee Herter, Alferd Apa, DO    END OF SESSION:  OT End of Session - 08/12/22 1359     Visit Number 6    Number of Visits 12    Date for OT Re-Evaluation 08/20/22    Authorization Type [employer]    OT Start Time 1400    OT Stop Time 1445    OT Time Calculation (min) 45 min    Activity Tolerance Patient tolerated treatment well;Patient limited by fatigue;Patient limited by lethargy;Other (comment);No increased pain    Behavior During Therapy Impulsive                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   HTN (hypertension) 07/19/2015   Hyperlipidemia    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   Mild, related to stroke history   Nocturia    Renal insufficiency 06/25/2013   S/P carotid endarterectomy    BILATERAL ICA--  PATENT PER DUPLEX  05-19-2012   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/26/2017   Right calf   Urinary frequency    Vitamin D deficiency    Past Surgical History:  Procedure Laterality Date   APPENDECTOMY  AS CHILD   CARDIOVASCULAR STRESS TEST  03-27-2012  DR CRENSHAW   LOW RISK LEXISCAN STUDY-- PROBABLE NORMAL PERFUSION AND SOFT TISSUE ATTENUATION/  NO ISCHEMIA/ EF 51%   CAROTID ENDARTERECTOMY Bilateral LEFT  11-12-2008  DR GREG HAYES   RIGHT ICA  2006  ([facility])   CHOLECYSTECTOMY N/A 02/23/2022   Procedure: LAPAROSCOPIC CHOLECYSTECTOMY;  Surgeon:  Felicie Morn, MD;  Location: [facility];  Service: General;  Laterality: N/A;   CYSTOSCOPY W/ RETROGRADES Bilateral 06/22/2021   Procedure: CYSTOSCOPY WITH RETROGRADE PYELOGRAM;  Surgeon: Franchot Gallo, MD;  Location: [facility];  Service: Urology;  Laterality: Bilateral;   CYSTOSCOPY WITH LITHOLAPAXY N/A 02/26/2013   Procedure: CYSTOSCOPY WITH LITHOLAPAXY;  Surgeon: Franchot Gallo, MD;  Location[facility];  Service: Urology;  Laterality: N/A;   ENDOSCOPIC RETROGRADE CHOLANGIOPANCREATOGRAPHY (ERCP) WITH PROPOFOL N/A 02/22/2022   Procedure: ENDOSCOPIC RETROGRADE CHOLANGIOPANCREATOGRAPHY (ERCP) WITH PROPOFOL;  Surgeon: Carol Ada, MD;  Location: [place];  Service: Gastroenterology;  Laterality: N/A;   EYE SURGERY  Jan. 2016   cataract surgery both eyes   INGUINAL HERNIA REPAIR Right 11-08-2006   IR KYPHO EA ADDL LEVEL THORACIC OR LUMBAR  02/12/2021   IR RADIOLOGIST EVAL & MGMT  02/18/2021   MASS EXCISION N/A 03/03/2016   Procedure: EXCISION OF BACK  MASS;  Surgeon: Stark Klein, MD;  Location: [place];  Service: General;  Laterality: N/A;   MOHS SURGERY Left 1/ 2016   Dr Nevada Crane-- Basal cell   PROSTATE SURGERY     REMOVAL OF STONES  02/22/2022   Procedure: REMOVAL OF STONES;  Surgeon: Carol Ada, MD;  Location: [place];  Service: Gastroenterology;;  SPHINCTEROTOMY  02/22/2022   Procedure: SPHINCTEROTOMY;  Surgeon: Carol Ada, MD;  Location: [facility] ENDOSCOPY;  Service: Gastroenterology;;   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-C N/A 06/22/2021   Procedure: TRANSURETHRAL RESECTION OF BLADDER TUMOR;  Surgeon: Franchot Gallo, MD;  Location: [facility];  Service: Urology;  Laterality: N/A;   TRANSURETHRAL RESECTION OF PROSTATE N/A 02/26/2013   Procedure: TRANSURETHRAL RESECTION OF THE PROSTATE WITH GYRUS INSTRUMENTS;  Surgeon: Franchot Gallo, MD;  Location: [facility];  Service:  Urology;  Laterality: N/A;   TRANSURETHRAL RESECTION OF PROSTATE N/A 06/22/2021   Procedure: TRANSURETHRAL RESECTION OF THE PROSTATE (TURP);  Surgeon: Franchot Gallo, MD;  Location: [facility];  Service: Urology;  Laterality: N/A;   Patient Active Problem List   Diagnosis Date Noted   Dysuria 08/03/2022   Nonintractable headache 07/01/2022   Bilateral impacted cerumen 06/24/2022   Rash 06/15/2022   Postoperative ileus ([place]) 02/27/2022   Ileus, postoperative ([place]) 02/26/2022   Choledocholithiasis 02/19/2022   DNR (do not resuscitate) 02/19/2022   Left elbow pain 01/26/2022   Mid back pain on left side 01/26/2022   Rib pain 01/26/2022   Acute pain of left shoulder 11/12/2021   Leukocytes in urine 11/12/2021   Urinary frequency 11/12/2021   Thrush 10/08/2021   Hemiplegia, dominant side S/P CVA (cerebrovascular accident) ([place]) 09/11/2021   Insomnia    Prediabetes    Acute 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 ([place])    Basal ganglia infarction ([place]) 07/29/2021   Transaminitis 07/27/2021   UTI (urinary tract infection) 07/27/2021   CVA (cerebral vascular accident) ([place]) 07/27/2021   Fall 07/27/2021   Hyperglycemia 07/27/2021   Cholelithiasis 07/27/2021   Hypoxia 07/27/2021   Nausea and vomiting [id number]   Acute metabolic encephalopathy [id number]   Normocytic anemia 07/27/2021   Chronic back pain 07/27/2021   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[place]) 06/22/2021   Closed fracture of first lumbar vertebra with routine healing 02/03/2021   Closed fracture of multiple ribs 11/18/2020   Anxiety 01/29/2020   Leg pain, bilateral 01/29/2020   Ingrown toenail 07/13/2019   Lumbar spondylosis 05/02/2018   Pain in left knee 03/09/2018   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   Trochanteric bursitis of left hip 01/16/2018   Preventative health care 09/26/2017   HTN (hypertension) 07/19/2015   Hyperlipidemia 07/19/2015   Great  toe pain 02/11/2014   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   Obesity (BMI 30-39.9) 06/25/2013   Renal insufficiency 06/25/2013   Weakness of left arm 06/25/2013   Sebaceous cyst 03/03/2011   Sprain of lumbar region 07/31/2010   Rib pain, left 08/29/2009   Carotid artery stenosis, asymptomatic, bilateral 05/02/2009   Eczema, atopic 05/31/2008   Vitamin D deficiency 03/01/2008   BPH (benign prostatic hyperplasia) 08/06/2007   Fasting hyperglycemia 12/21/2006   History of right MCA infarct 2006    ONSET DATE: Acute exacerbation of chronic symptoms (most recent hospitalization 02/26/22 - 03/12/22, followed by SNF and home health)   REFERRING DIAG:  I63.9 (ICD-10-CM) - Cerebrovascular accident (CVA), unspecified mechanism ([place])  W19.XXXD (ICD-10-CM) - Fall, subsequent encounter    THERAPY DIAG:  Other lack of coordination  Muscle weakness (generalized)  Hemiplegia and hemiparesis following cerebral infarction affecting left non-dominant side (HCC)  Attention and concentration deficit  Other symptoms and signs involving cognitive functions following cerebral infarction  Acute pain of left shoulder  Other abnormalities of gait and mobility  Rationale for Evaluation and  Treatment: Rehabilitation  PERTINENT HISTORY: PMHx includes L basal ganglia and corona radiata infarct 07/27/21, L lower homonymous quadrantanopia; R MCA infarct in 2006 w/ L-sided hemiparesis, NCD, carotid artery disease (bilateral), HTN, HLD,CKD3, vascular dementia, and hx of bladder cancer. He has had falls in 2023, fracturing his clavicle in August, then he had stomach surgery in Sept 2023. He has He needs assist at baseline for ADLs. When last documented in acute rehab after having his gall-bladder removed, (03/09/22) he required moderate to maximum assist x2 for most ADLs (more assist for ADLs the require standing), and they recommended rehab in skilled nursing setting. He apparently got Covid at some  point and also attended a SNF. Once he D/C home, he had some episodes of combativeness at night  with "sun-downing."  He is communicative and appropriate, though appears easily confused and distracted, not fully oriented to time and place (Ax2, states date is 27 and cannot say what city he lives in). His wife provides most details, and he is participative, though a poor historian at times. In a nut shell- he has been suffering from dementia and post-stroke effects for years, and his issues were recently exacerbated in Sept of last year after abdominal surgery. He has less use of Lt "stroke side" and new, ritualistic "ticks" in mouth and Rt hand/arm.  Wife states that they do have a caregiver come M, W, F for 4 hours in the middle of the day to mainly help him bathe and dress, which usually tires him out. He has been more combative towards the end of the day as well.   PRECAUTIONS: Fall and Other: Lt hemi, poor cognition, LATEX and adhesive allergies, etc.  ;WEIGHT BEARING RESTRICTIONS: No   SUBJECTIVE:   SUBJECTIVE STATEMENT:  "I feel good" Pt accompanied by:  his wife  PAIN:  Are you having pain? Not now at rest Rating: 0/10 at rest now   FALLS: Has patient fallen in last 6 months? Yes. Number of falls at least 1 resulting in clavicle fx (now healed)   LIVING ENVIRONMENT: Lives with: lives with their spouse Lives in: House/apartment Has following equipment at home: manual w/c, 2 w/w, weighted utensils, shower bench (for tub), bed cane, hospital bed, raised toilet, and possibly more  PLOF: Needs assistance with ADLs, Needs assistance with homemaking, Needs assistance with gait, and Needs assistance with transfers  PATIENT GOALS: Top caregiver goals today:  #1- increase personal hygiene skills (brush teeth, shaving) #2- prepare instant coffee or microwave meal #3- improve functional cognitive skills to play games, etc.,  #4- increase Lt arm usage #5- try to help decrease Rt hand/arm  perseveration (flicking, tapping, noise making) #6- Improve fnl tranfers in/out of multiple seats (tub bench, swivel chair, etc.)    OBJECTIVE: (All objective assessments below are from initial evaluation on: 07/07/22 unless otherwise specified.)   HAND DOMINANCE: Right  ADLs: Overall ADLs: Wife states decreased ability since hospitalization in Sept 23.  Transfers/ambulation related to ADLs: Min A transfer, CGA for mobility Eating: set up Grooming: Mod A- needs assist with oral care, shaving, but can comb his hair UB Dressing: Min A  LB Dressing: Dep  Toileting:  Mod  Bathing: Mod  Tub Shower transfers: Min A  Equipment: Radio broadcast assistant, Grab bars, and Feeding equipment  IADLs: completely dependent for years now, except some ability to use microwave which is now not possible after hospitalization   MOBILITY STATUS: Needs Assist: CGA - Min A, Hx of falls, Festination, difficulty with  turns, and neglect on Lt side  POSTURE COMMENTS:  rounded shoulders, forward head, and weight shift left  ACTIVITY TOLERANCE: Activity tolerance: he became tired and somewhat agitated by the end of the OT evaluation today (after PT and OT about 2.5hours with mostly sedate activities in seated positions)  FUNCTIONAL OUTCOME MEASURES: [place] 17/30  showing problems with visuospatial/executive function, attention, fluency, and orientation. (at least 26/30 is needed to be considered "normal")  [employer] in ADLs: 1/6  (very dependent)  UPPER EXTREMITY ROM:    Active ROM Right eval Left eval  Shoulder scaption 100 85  Shoulder abduction    Shoulder adduction    Shoulder extension    Shoulder internal rotation Reaches small of back Reaches to hip  Shoulder external rotation Reaches back of head  Reaches up to ear  Elbow flexion 150 130  Elbow extension (-10*)  (-35*)  Wrist flexion 35 20  Wrist extension 50 10  (Blank rows = not tested)  UPPER EXTREMITY MMT:    Tested grossly as "push"  and "pull": UE push 4-/5, UE pull 4/5  HAND FUNCTION: Grip strength: Right: 30 lbs; Left: 20 lbs  COORDINATION: Finger Nose Finger test: mildly ataxic but fairly accurate  Box and Blocks:  Right 11blocks, Left 5blocks  MUSCLE TONE:  Rt- normal, Lt- decreased and worsened by inattention/disuse  COGNITION: Overall cognitive status: Impaired and Difficulty to assess due to: severity of deficits; Poor attention, fluency, orientation, and executive function. Recall is fairly good as well as abstraction.  Seems to have personality changes due to vascular dementia- like an OCD type volitional perseveration of tapping with Rt hand that he can somewhat control.  He did become angry and agitated at the end of the session performing car transfer with spouse: he did shout at her.  VISION: Subjective report: no new changes per pt/spouse, uses "cheaters"   VISION ASSESSMENT: Visual fields seem intact though he had latent responses on the left side showing likely inattention/and/neglect, tracking somewhat delayed horizontally and vertically  PERCEPTION: Impaired: Inattention/neglect: does not attend to left visual field and does not attend to left side of body, Body scheme: poor quality of recognition with shaving, etc., and Spatial orientation: decreased in print and in orientation, especially to Lt side  PRAXIS: Not tested specifically but no overt/stated issues using pen, wearing glasses ,etc.   OBSERVATIONS: Pt taps and flicks and does seem to volitionally perseverate on making a rhythm/noise with Rt hand. He states he is aware of it and chooses to do it.  He does also seem to have mild resting and intention tremor.    TODAY'S TREATMENT:                                                                                                                              DATE:  08/12/22-Supine closed chain chest press and shoulder flexion with ball , min-mod facilitation and v.c for LUE use Supine trunk  rotation passing ball between hands, mo-max facilitation/ v.c Seated edge of mat, functional reaching with LUE to place/ remove  graded clothespins on vertical  target with LUE, mod v.c, facilitation for orientation and grasp of clothespins Simulated donning shirt with "bag" exercise, mod facilitation for LUE Seated reaching to touch ankles to simulate LB dressing, mod v.c Therapist encouraged pt/ wife to have pt assist with pulling up pants, and use of larger grasp with LUE.      08/10/22-Pt ambulated to OT treatment room from lobby with close supervision- min guard for larger step length, shuffling gait present. Seated on mat with mirror in front pt performed closed chain chest press, shoulder flexion and L abduction with min-mod facilitation and v.c Functional reaching grasp checkers on left and to place checkers in connect 4 frame with LUE, min-mod facilitation/ v.c, for performance, and opening left hand to grab items.  Placing large pegs in pegboard with LUE mod difficulty, facilitation and v.c Pt's wife held RUE during functional activities to encourage increased use of LUE. Arm bike x 4 mins level 1 for conditioning/ reciprocal movement. Pt was able to maintain left grip for task.         08/02/22: He starts by trying to reach overhead for shoulder range of motion, but the left shoulder is stiff and tight.  He states having some discomfort in the left shoulder, so OT review shoulder stretches with him and his spouse.  He does this is seated reaching across a table for flexion stretch also in abduction and external rotation.  These are a review for them.  Next to increase use of the left side, OT positions him with a table to his left and he performs multiple functional activities with pegs, games like next 4, reaching out to the left.  His right hand is needed to be held down during this at times.  Next he performs other bimanual task or forced use of his left hand for example he uses  his left hand to wash his right hand as he cannot do that with the right.  OT uses mirrors that time and verbal and physical cues to give feedback to him on his performance and educates that sometimes verbal cues can be considered aggressive by the person and the silent physical cues might be tolerated better when the person has anxiety.    07/28/22: Today occupational therapy focused on their personal goals of: Functional transfers, functional cognition to play game, microwave usage, and increased left arm usage.  He starts by performing sit to stand and walking to the kitchen area and having seated at a table.  OT then facilitates the process of him reading and attempting to comprehend the directions on a packet of oatmeal, collecting the necessary ingredients safely with functional mobility, safe use of microwave and disposal of trash and cleaning up.  He did have some safety concerns with selecting the incorrect time on the microwave and risk of potential burn with hot bowl that required intervention from the OT.  Otherwise he needed cues for his memory for some of the steps of the process and close supervision for functional mobility.  Next he walks to the therapy room and performs a seated game activity, with bimanual tasks and shuffling cards, cognitive tasks for following directions and taking turns, left hand forced use by OT holding his right hand.  OT also attempted to place objects on the left side of his body to improve awareness of the left side.  At the end of the session OT helps him perform another functional transfer and get into his vehicle with contact-guard assist.   07/21/22: OT provides caregiver education for adapting home environments to increase safety or awareness for the patient.  This includes things like putting alarming or alerting devices to his left side to increase awareness of the left side, removing clutter and distractions and simplifying the environment, having orienting  materials posted with an eye shot, and others.  His caregiver does state doing some of these already.  Additionally she was given paperwork on strategies for specific functional activities like hygiene and shaving and she does state getting him an Copy and it has helped somewhat.  She states that he is better at shaving with it.  They are also given the name of a neuropsychologist who specializes in dementia with him they could get a consultation or second opinion if they desired.  Next to improve the use of the left arm, OT gives him the following home exercise program for bilateral arm use on a wand or cane or dowel rod.  He needs verbal and physical cues as he neglects his left side significantly still.  OT also utilizes certain visual problems or targets to reach towards as his effort and his attention quickly wanes during these exercises.  When cued to "tap the wall" he reaches out or performs better versus "moves side-to-side."  These type of targeting strategies were emphasized to his caregiver and she sees the benefit and states understanding.  Exercises - Seated Shoulder Flexion AAROM with Dowel  - 4-6 x daily - 1 sets - 10-15 reps - Seated Shoulder Abduction AAROM with Dowel  - 4-6 x daily - 1 sets - 10-15 reps - Seated Shoulder External Rotation AAROM with Dowel  - 4-6 x daily - 1 sets - 10-15 reps - Seated Bicep Curls with Bar  - 4-6 x daily - 1 sets - 10-15 reps - Seated Shoulder Row with Anchored Resistance  - 4-6 x daily - 1 sets - 10-15 reps   PATIENT EDUCATION: Education details: See treatment section above for details- 08/12/22 Person educated: Patient and Spouse Education method: Explanation, Demonstration, Tactile cues, and Verbal cues Education comprehension: verbalized understanding, returned demonstration, verbal cues required, tactile cues required, and needs further education  HOME EXERCISE PROGRAM: Access Code: [email or web address] URL:  [email or web address] Date: 07/21/2022 Prepared by: Benito Mccreedy    GOALS: Goals reviewed with patient? Yes   SHORT TERM GOALS: (STG required if POC>30 days) Target Date: 07/23/22   Pt will demo/state understanding of initial HEP (or home activity suggestions to maintain function as appropriate) to improve functional skills. Goal status: 07/28/22- Met (addressed in past 2 sessions)   LONG TERM GOALS: Target Date: 08/20/22  Pt will improve functional ability by decreased impairment per Redmond Baseman assessment from 1/6 to 2/6 or better, for better quality of life. Goal status:  ongoing  2.  Caregiver will state his grooming ability/hygiene ability improved from moderate assist to supervision assist. Goal status: ongoing  3.  He will show better functional use of left arm by improved box and blocks Test score from 5 blocks to at least 10 blocks. Goal status:ongoing  4.  Pt will show ability to microwave instant coffee or small meal safely, to help decrease caregiver burden. Goal status: ongoing- 08/10/22-pt's wife reports pt is performing with supervision at home  5.  He will show improved cognitive ability enough to play a simple game with OT  and caregiver. Goal status: ongoing  6.  Caregiver will state at least 4 ways to decrease perseveration with right arm, also stating that this behaviors at least somewhat better in the home now. Goal status: ongoing   ASSESSMENT:  CLINICAL IMPRESSION: Pt is progressing towards goals with improving LUE functional use. Pt's wife reports is still getting used to using his Copy at home. OT DURATION: 6 weeks (through 08/20/22 as needed)   PLANNED INTERVENTIONS: self care/ADL training, therapeutic exercise, therapeutic activity, neuromuscular re-education, manual therapy, balance training, functional mobility training, moist heat, cryotherapy, patient/family education, cognitive remediation/compensation,  visual/perceptual remediation/compensation, psychosocial skills training, energy conservation, coping strategies training, DME and/or AE instructions, and Re-evaluation  RECOMMENDED OTHER SERVICES: He has already seen PT for balance and functional mobility, he would probably benefit from speech for cognitive issues as well, though OT will be working with cognition somewhat functionally now.  CONSULTED AND AGREED WITH PLAN OF CARE: Patient and family member/caregiver  PLAN FOR NEXT SESSION:  Continue working towards  long term goals, start checking long term goals,  practice pulling pants over legs, functional use of LUE Kevontay Burks, OTR/L 3:07 PM 08/12/22

## 2022-08-12 ENCOUNTER — Encounter: Payer: Self-pay | Admitting: Occupational Therapy

## 2022-08-12 ENCOUNTER — Ambulatory Visit: Payer: PPO | Admitting: Occupational Therapy

## 2022-08-12 DIAGNOSIS — R2689 Other abnormalities of gait and mobility: Secondary | ICD-10-CM

## 2022-08-12 DIAGNOSIS — I679 Cerebrovascular disease, unspecified: Secondary | ICD-10-CM | POA: Diagnosis not present

## 2022-08-12 DIAGNOSIS — R41840 Attention and concentration deficit: Secondary | ICD-10-CM

## 2022-08-12 DIAGNOSIS — R278 Other lack of coordination: Secondary | ICD-10-CM

## 2022-08-12 DIAGNOSIS — I69354 Hemiplegia and hemiparesis following cerebral infarction affecting left non-dominant side: Secondary | ICD-10-CM

## 2022-08-12 DIAGNOSIS — I10 Essential (primary) hypertension: Secondary | ICD-10-CM | POA: Diagnosis not present

## 2022-08-12 DIAGNOSIS — M6281 Muscle weakness (generalized): Secondary | ICD-10-CM

## 2022-08-12 DIAGNOSIS — M25512 Pain in left shoulder: Secondary | ICD-10-CM

## 2022-08-12 DIAGNOSIS — I69318 Other symptoms and signs involving cognitive functions following cerebral infarction: Secondary | ICD-10-CM

## 2022-08-16 ENCOUNTER — Ambulatory Visit: Payer: PPO | Admitting: Occupational Therapy

## 2022-08-16 ENCOUNTER — Other Ambulatory Visit: Payer: Self-pay

## 2022-08-16 ENCOUNTER — Encounter: Payer: Self-pay | Admitting: Physical Therapy

## 2022-08-16 ENCOUNTER — Ambulatory Visit: Payer: PPO | Attending: Family Medicine | Admitting: Physical Therapy

## 2022-08-16 DIAGNOSIS — I69318 Other symptoms and signs involving cognitive functions following cerebral infarction: Secondary | ICD-10-CM | POA: Insufficient documentation

## 2022-08-16 DIAGNOSIS — I69354 Hemiplegia and hemiparesis following cerebral infarction affecting left non-dominant side: Secondary | ICD-10-CM | POA: Insufficient documentation

## 2022-08-16 DIAGNOSIS — R296 Repeated falls: Secondary | ICD-10-CM | POA: Insufficient documentation

## 2022-08-16 DIAGNOSIS — R278 Other lack of coordination: Secondary | ICD-10-CM | POA: Insufficient documentation

## 2022-08-16 DIAGNOSIS — Z9181 History of falling: Secondary | ICD-10-CM | POA: Insufficient documentation

## 2022-08-16 DIAGNOSIS — R41840 Attention and concentration deficit: Secondary | ICD-10-CM | POA: Diagnosis not present

## 2022-08-16 DIAGNOSIS — M25512 Pain in left shoulder: Secondary | ICD-10-CM | POA: Diagnosis not present

## 2022-08-16 DIAGNOSIS — R262 Difficulty in walking, not elsewhere classified: Secondary | ICD-10-CM | POA: Insufficient documentation

## 2022-08-16 DIAGNOSIS — M6281 Muscle weakness (generalized): Secondary | ICD-10-CM | POA: Insufficient documentation

## 2022-08-16 DIAGNOSIS — R2689 Other abnormalities of gait and mobility: Secondary | ICD-10-CM | POA: Diagnosis not present

## 2022-08-16 NOTE — Therapy (Signed)
OUTPATIENT PHYSICAL THERAPY NEURO Treatment   Patient Name: William [name] MRN: [id number] DOB:1940/07/02, 82 y.o., male Today's Date: 08/16/2022  PCP: Lowne Chase, [place] PROVIDER: same  END OF SESSION:  PT End of Session - 08/16/22 1537     Visit Number 12    Date for PT Re-Evaluation 09/29/22    PT Start Time 1530    PT Stop Time 1615    PT Time Calculation (min) 45 min    Equipment Utilized During Treatment Gait belt    Activity Tolerance Patient tolerated treatment well    Behavior During Therapy Impulsive             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   HTN (hypertension) 07/19/2015   Hyperlipidemia    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   Mild, related to stroke history   Nocturia    Renal insufficiency 06/25/2013   S/P carotid endarterectomy    BILATERAL ICA--  PATENT PER DUPLEX  05-19-2012   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/26/2017   Right calf   Urinary frequency    Vitamin D deficiency    Past Surgical History:  Procedure Laterality Date   APPENDECTOMY  AS CHILD   CARDIOVASCULAR STRESS TEST  03-27-2012  DR CRENSHAW   LOW RISK LEXISCAN STUDY-- PROBABLE NORMAL PERFUSION AND SOFT TISSUE ATTENUATION/  NO ISCHEMIA/ EF 51%   CAROTID ENDARTERECTOMY Bilateral LEFT  11-12-2008  DR GREG HAYES   RIGHT ICA  2006  ([facility])   CHOLECYSTECTOMY N/A 02/23/2022   Procedure: LAPAROSCOPIC CHOLECYSTECTOMY;  Surgeon: Felicie Morn, MD;  Location: [place];  Service: General;  Laterality: N/A;   CYSTOSCOPY W/ RETROGRADES  Bilateral 06/22/2021   Procedure: CYSTOSCOPY WITH RETROGRADE PYELOGRAM;  Surgeon: Franchot Gallo, MD;  Location: [place];  Service: Urology;  Laterality: Bilateral;   CYSTOSCOPY WITH LITHOLAPAXY N/A 02/26/2013   Procedure: CYSTOSCOPY WITH LITHOLAPAXY;  Surgeon: Franchot Gallo, MD;  Location: [facility];  Service: Urology;  Laterality: N/A;   ENDOSCOPIC RETROGRADE CHOLANGIOPANCREATOGRAPHY (ERCP) WITH PROPOFOL N/A 02/22/2022   Procedure: ENDOSCOPIC RETROGRADE CHOLANGIOPANCREATOGRAPHY (ERCP) WITH PROPOFOL;  Surgeon: Carol Ada, MD;  Location: [place];  Service: Gastroenterology;  Laterality: N/A;   EYE SURGERY  Jan. 2016   cataract surgery both eyes   INGUINAL HERNIA REPAIR Right 11-08-2006   IR KYPHO EA ADDL LEVEL THORACIC OR LUMBAR  02/12/2021   IR RADIOLOGIST EVAL & MGMT  02/18/2021   MASS EXCISION N/A 03/03/2016   Procedure: EXCISION OF BACK  MASS;  Surgeon: Stark Klein, MD;  Location: [place];  Service: General;  Laterality: N/A;   MOHS SURGERY Left 1/ 2016   Dr Nevada Crane-- Basal cell   PROSTATE SURGERY     REMOVAL OF STONES  02/22/2022   Procedure: REMOVAL OF STONES;  Surgeon: Carol Ada, MD;  Location: [place];  Service: Gastroenterology;;   William [name]  02/22/2022   Procedure: William [name];  Surgeon: Carol Ada, MD;  Location: [place];  Service: Gastroenterology;;   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-C N/A 06/22/2021   Procedure: TRANSURETHRAL RESECTION OF BLADDER TUMOR;  Surgeon: Franchot Gallo, MD;  Location: [facility];  Service: Urology;  Laterality: N/A;   TRANSURETHRAL RESECTION OF PROSTATE N/A 02/26/2013   Procedure: TRANSURETHRAL RESECTION OF THE PROSTATE WITH GYRUS INSTRUMENTS;  Surgeon: Franchot Gallo, MD;  Location: [facility];  Service: Urology;  Laterality: N/A;   TRANSURETHRAL RESECTION OF PROSTATE N/A 06/22/2021   Procedure: TRANSURETHRAL  RESECTION OF THE PROSTATE (TURP);  Surgeon: Franchot Gallo, MD;  Location: [facility];  Service: Urology;  Laterality: N/A;   Patient Active Problem List   Diagnosis Date Noted   Dysuria 08/03/2022   Nonintractable headache 07/01/2022   Bilateral impacted cerumen 06/24/2022   Rash 06/15/2022   Postoperative ileus ([place]) 02/27/2022   Ileus, postoperative ([place]) 02/26/2022   Choledocholithiasis 02/19/2022   DNR (do not resuscitate) 02/19/2022   Left elbow pain 01/26/2022   Mid back pain on left side 01/26/2022   Rib pain 01/26/2022   Acute pain of left shoulder 11/12/2021   Leukocytes in urine 11/12/2021   Urinary frequency 11/12/2021   Thrush 10/08/2021   Hemiplegia, dominant side S/P CVA (cerebrovascular accident) ([place]) 09/11/2021   Insomnia    Prediabetes    Acute 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 ([place])    Basal ganglia infarction ([place]) 07/29/2021   Transaminitis 07/27/2021   UTI (urinary tract infection) 07/27/2021   CVA (cerebral vascular accident) ([place]) 07/27/2021   Fall 07/27/2021   Hyperglycemia 07/27/2021   Cholelithiasis 07/27/2021   Hypoxia 07/27/2021   Nausea and vomiting [id number]   Acute metabolic encephalopathy [id number]   Normocytic anemia 07/27/2021   Chronic back pain 07/27/2021   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[place]) 06/22/2021   Closed fracture of first lumbar vertebra with routine healing 02/03/2021   Closed fracture of multiple ribs 11/18/2020   Anxiety 01/29/2020   Leg pain, bilateral 01/29/2020   Ingrown toenail 07/13/2019   Lumbar spondylosis 05/02/2018   Pain in left knee 03/09/2018   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   Trochanteric bursitis of left hip 01/16/2018   Preventative health care 09/26/2017   HTN (hypertension) 07/19/2015   Hyperlipidemia 07/19/2015   Great toe pain 02/11/2014   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   Obesity (BMI 30-39.9) 06/25/2013    Renal insufficiency 06/25/2013   Weakness of left arm 06/25/2013   Sebaceous cyst 03/03/2011   Sprain of lumbar region 07/31/2010   Rib pain, left 08/29/2009   Carotid artery stenosis, asymptomatic, bilateral 05/02/2009   Eczema, atopic 05/31/2008   Vitamin D deficiency 03/01/2008   BPH (benign prostatic hyperplasia) 08/06/2007   Fasting hyperglycemia 12/21/2006   History of right MCA infarct 2006    ONSET DATE: 06/29/22  REFERRING DIAG:  Diagnosis  I63.9 (ICD-10-CM) - Cerebrovascular accident (CVA), unspecified mechanism ([place])  W19.XXXD (ICD-10-CM) - Fall, subsequent encounter    THERAPY DIAG:  Other lack of coordination  Muscle weakness (generalized)  Hemiplegia and hemiparesis following cerebral infarction affecting left non-dominant side (HCC)  History of falling  Rationale for Evaluation and Treatment: Rehabilitation  SUBJECTIVE:  SUBJECTIVE STATEMENT: No falls, doing well. No c/o pain, wife reports that he is doing better with his car transfer PERTINENT HISTORY:  William [name] is a 82 year old man with dementia, CKD, HTN, CAD, HLD and history of CVA  (2006, 2021), Parkinson's  PAIN:  Are you having pain? No  PRECAUTIONS: Fall  WEIGHT BEARING RESTRICTIONS: No  FALLS: Has patient fallen in last 6 months? No  LIVING ENVIRONMENT: Lives with: lives with their family and lives with their spouse Lives in: House/apartment Stairs:  1 brick high step Has following equipment at home: Environmental consultant - 2 wheeled, Wheelchair (manual), Shower bench, bed side commode, Grab bars, and hospital bed, sliding pad for car  PLOF: Independent with basic ADLs and Independent with household mobility without device  PATIENT GOALS: Walk around the block with his wife. Be able to use the dining room chair  rather than have to use the W/C. Increased I with bed mobility and simple tasks at home like make some coffee or brush his teeth, Step over tub to shower, has bench, but he can't really use it. Up and down the one step to get to back deck.  OBJECTIVE:   DIAGNOSTIC FINDINGS:  MRI 2021 with degenerative changes in lumbar spine, L3-4 R subarticular stenosis  COGNITION: Overall cognitive status: History of cognitive impairments - at baseline   SENSATION: Not tested  COORDINATION: Moderately impaired BLE   MUSCLE LENGTH: Hamstrings: severely restricted B Thomas test: Severely restricted B.  POSTURE: rounded shoulders, forward head, increased thoracic kyphosis, posterior pelvic tilt, and flexed trunk   LOWER EXTREMITY ROM:   BLE extremely stiff throughout, limited hip ROM in all planes, B knees limited in extension.   LOWER EXTREMITY MMT:  3+/5 throughout. Unable to determine accuracy of MMT due to cognition. Functionally demosntrates poor coordinated activation and poor muscular endurance.   BED MOBILITY: Min A for Supine<> Sit. Patient was using a ladder in his hospital bed and could move MI.  TRANSFERS: Min A, mod VC, tends to lean back.  CURB: Min A-Has one step out to his deck.  STAIRS: N/A   GAIT: Gait pattern: step to pattern, decreased arm swing- Right, decreased arm swing- Left, decreased step length- Right, decreased step length- Left, shuffling, festinating, trunk flexed, narrow BOS, poor foot clearance- Right, and poor foot clearance- Left Distance walked: 31' Assistive device utilized: Environmental consultant - 2 wheeled Level of assistance: Occasional min A Comments: mod VC to stay close to walker, take longer steps.  FUNCTIONAL TESTS:  5 times sit to stand: 38.23 Timed up and go (TUG): 47.91  with RW, 07/21/22 TUG 29 seconds without device  TODAY'S TREATMENT:                                                                                                                                DATE:  08/16/22 Standing hip marches and hip abduction and extension in Pbars with a lot  of cues for the movements Stairs step over step with one hand rail and verbal cues to assure clearing the toe UBE level 3 x 5 minutes Leg press with a lot of help getting on and off 20# a lot of cues needed for bigger motions Side stepping, forward and back ward walking with HHA Gait outside down small slope, down and up curb then back with a rest with CGA and a lot of cues Gait outside the front 200 feet to the car  08/11/22 Side stepping in pbars Marches, hip abduction, extension Fast walking Stairs 4" and 6" Nustep level 5 x 6 minutes Sit to stand multiple times and multiple ways On airex head turns, reaching Seated volleyball, standing volleyball Seated ball kicks Gait outside around the [place] and to the car   08/09/22 NuStep L5 x 6 minutes Functional status re-assessed for PN-see goals. Balance training-standing on Air ex pad in parallel bars, rotation to L, holding therapist's hand only for balance, up to min A.   08/04/22 Met him at the car, helped with the transfer out of the car, then walked outside with cues for big smooth steps around the [place] Bike level 3 x 6 minutes Nustep Level 5 x 5 minutes Gait outside down and up two curbs CGA a lot of cues for steps and to slow down Sit to stand with a lot of cues for nose over toes Seated volleyball, some standing with knees touching bed In pbars sides stepping, backward walking, marching When leaving walked out and around the [place] again to the car, much more assist and cues as he was tired and started shuffling Some bed mobility in a single room without distractions  08/02/22 NuStep L5 x 6 minutes Supine rotational stretch to trunk, knees in hooklying, rotate to L, reach with L arm to R and back x 5, repeat to the R. Roll to L side lie, reach with R arm into rotation forward and back, tried on L, but his L hip hurt in side  lie. Seated trunk rotation to R, reach with L hand, sliding out on mat and back x 5, repeat to R. Seated forward flexion over physioball 5 x 3 sec holds Ambulation with 2# WATE bar held at chest level, emphasizing long steps, U UE support. 69' B side stepping in parallel bars, on airex plank with BUE support, 3 x each direction. Walking in figure 8 around 2 cones on the floor, facing 1 direction at all times to ensure changing directions, CGA, max VC, mildly unsteady. Alternating step taps on 2, 6" steps for balance.  07/28/22 NuStep L3 x 6 minutes. Stand with back to corner. Use BUE to lift red physioball and rotate to R to touch target on wall, return to cneter, then repeat to L. Walk with ball, reaching straight up to touch overhead target without LOB, CGA for both. Side to side step over 2" obstacle on the floor. He was able to move side to side, but was not able to follow the commands for safe performance. Step over air ex planks on the floor, using R, then L legs, BUE support on parallel bars. Stable Climbing up and down steps using step over step technique. Required cues to move L hand along with his feet. 2# weight in BUE, reach down to knee height to touch mat, turn to R and up to touch target on wall. 10 x in each direction. Good reaching above shoulder height, needed cues to engage L hand.  07/26/22 Nustep Level 5 x 6 minutes Sit to stand with various heights x 5 each a lot of verbal cues needed and at times some assist Volley ball sitting and standing, some ball kicking Gait without device 3x 150 feet with cues for step length and speed also distractions In Pbars side steps, big steps and backward steps In pbars head and trunk rotations Sitting and standing reaching for cones iwht each hand Supine rolling to sides, feet on ball trunk rotation, bridges Bridges without ball Sitting trunk elongations with reaching and twisting Holding onto rail 12 inch toe touches  07/21/22 Nustep  level 5 x 6 minutes TUG 29 seconds without device SBA Volleyball sitting, standing with legs touching table and then without Partial sit ups 6" toe touches Cone touches working on weight shifts, body turns and reaching for balance 4" step ups, close CGA and at times mod A due to LOB, needed a lot of redirecting to stay on task as he is easily distracted Ball kicks with back of knees touching the table Side stepping Walking without device.  Outside around [place] and to his car with wife following with w/c  07/19/22:   Nustep L5 5 min, for tissue perfusion, endurance, coordination At steps in smaller room:  Standing for toe raises 10x Tandem standing forward foot on airex, with trunk rotation to each side, to focus on visual cue( waved to his wife) attempted to perform without UE support  Full turn R and L , x 2, with patient cued to place himself in front of mirror-mirror used for feedback to improve spatial awareness In ll bars  forward steps over airex balance beam  4 lengths, with 2 airex balance beams , cues to increase step length particularly on L Standing lateral step overs airex balance beam cues to place both feet over, patient tends to avoid placing second foot down  Seated for forward rolls with 65 cm therapeutic ball , with partial standing, 10 reps  Seated high marching with alternate arm punches, one 30 sec bout, patinet unable to really complete effectively Standing at steps with 21/2# cuff weight on each leg, for hip flex/ext and hip abd, 10 x each Gait without device 2 separate sets, 100'. Verbal cues to elongate step length B PATIENT EDUCATION: Education details: POC Person educated: Patient and Spouse Education method: Explanation Education comprehension: verbalized understanding  HOME EXERCISE PROGRAM: TBD  GOALS: Goals reviewed with patient? Yes  SHORT TERM GOALS: Target date: 07/19/22  I with initial HEP Baseline: Goal status: ongoing  2. Decrease 5 x  STS to < 25 sec to demonstrate improved functional strength  Baseline: 38 sec  Goal status: 08/09/22 14 sec-met  3. Decrease TUG to < 35 sec to demonstrate improved functional balance  Baseline: 47 sec  Goal status: met 07/21/22  4. Perform sit <> stand transfers from average chair height with S and VC to be able to use the swivel chair at his dining table.  Baseline: Min A  Goal status: 08/09/22-Inconsistently performs  with S from average height. At times requires CGA to cue forward weight shift. ongoing  LONG TERM GOALS: Target date: 09/29/22  Patient will be able to step over his tub at home using grab bars for stability with CGA to access his shower. Baseline: Unable- has bench, but wife reports he steps over then sits. Goal status: ongoing 08/09/22  2.  Decrease 5 x STS to < 18 sec to demonstrate improved functional strength Baseline:  Goal status: 15-2/26/24-met  3.  Decrease TUG to < 25 sec to demonstrate improved functional balance Baseline:  Goal status: 08/09/22  19 sec-met  4.  Patient will perform his bed mobility with MI. Baseline: Min A Goal status: 08/09/22- I as demo on mat-met  5.  Patient will ambulate at least 400' with LRAD, CGA, demonstrate improved upright posture, longer step length. Baseline:  Goal status: 08/09/22-Amb 400', but began to fatigue after about 300' with more flexed posture, L foot shuffling more, unable to maintain safe technique. ongoing  6.  Patient will manage up and down a single step with LRAD, CGA to access his deck at home. Baseline: Unable Goal status: 08/09/22-Up and down 1 step with U rail. ongoing  ASSESSMENT:  CLINICAL IMPRESSION: Patient  continued to have distraction issues today, once he gets distracted he really has trouble getting back to the task, with the walking outside he tends to take the shuffling steps, doing so he is leaning forward and starts to get out of control, he needs cues to stop and regroup quite often  today. OBJECTIVE IMPAIRMENTS: Abnormal gait, decreased activity tolerance, decreased balance, decreased cognition, decreased coordination, decreased endurance, decreased mobility, difficulty walking, decreased ROM, decreased strength, decreased safety awareness, impaired flexibility, impaired UE functional use, improper body mechanics, and postural dysfunction.   ACTIVITY LIMITATIONS: carrying, lifting, bending, sitting, standing, squatting, stairs, transfers, bed mobility, bathing, toileting, dressing, hygiene/grooming, and locomotion level  PARTICIPATION LIMITATIONS: meal prep, cleaning, laundry, interpersonal relationship, shopping, and community activity  PERSONAL FACTORS: Age, Past/current experiences, and 3+ comorbidities: CVA, Parkinson's traits, dementia  are also affecting patient's functional outcome.   REHAB POTENTIAL: Good  CLINICAL DECISION MAKING: Evolving/moderate complexity  EVALUATION COMPLEXITY: Moderate  PLAN:  PT FREQUENCY: 2x/week  PT DURATION: 12 weeks  PLANNED INTERVENTIONS: Therapeutic exercises, Therapeutic activity, Neuromuscular re-education, Balance training, Gait training, Patient/Family education, Self Care, Joint mobilization, Stair training, Moist heat, and Manual therapy  PLAN FOR NEXT SESSION: Trunk and extremity mobilization, balance, strength, functional mobility re-education.  Lum Babe, PT 08/16/22 3:37 PM

## 2022-08-17 ENCOUNTER — Ambulatory Visit: Payer: PPO | Admitting: Occupational Therapy

## 2022-08-17 DIAGNOSIS — M6281 Muscle weakness (generalized): Secondary | ICD-10-CM

## 2022-08-17 DIAGNOSIS — M25512 Pain in left shoulder: Secondary | ICD-10-CM

## 2022-08-17 DIAGNOSIS — R278 Other lack of coordination: Secondary | ICD-10-CM | POA: Diagnosis not present

## 2022-08-17 DIAGNOSIS — I69354 Hemiplegia and hemiparesis following cerebral infarction affecting left non-dominant side: Secondary | ICD-10-CM

## 2022-08-17 DIAGNOSIS — I69318 Other symptoms and signs involving cognitive functions following cerebral infarction: Secondary | ICD-10-CM

## 2022-08-17 DIAGNOSIS — R41840 Attention and concentration deficit: Secondary | ICD-10-CM

## 2022-08-17 NOTE — Therapy (Signed)
OUTPATIENT OCCUPATIONAL THERAPY TREATMENT NOTE  Patient Name: William [name] MRN: [id number] DOB:[birth year], 82 y.o., male Today's Date: 08/17/2022  PCP & REFERRING PROVIDER:  Carollee Herter, Alferd Apa, DO    END OF SESSION:  OT End of Session - 08/17/22 1615     Visit Number 7    Number of Visits 12    Date for OT Re-Evaluation 08/20/22    Authorization Type [employer]    OT Start Time [id number]    OT Stop Time 1548    OT Time Calculation (min) 50 min    Activity Tolerance Patient tolerated treatment well;Patient limited by fatigue;Patient limited by lethargy;Other (comment);No increased pain    Behavior During Therapy Impulsive                 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   HTN (hypertension) 07/19/2015   Hyperlipidemia    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   Mild, related to stroke history   Nocturia    Renal insufficiency 06/25/2013   S/P carotid endarterectomy    BILATERAL ICA--  PATENT PER DUPLEX  05-19-2012   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/26/2017   Right calf   Urinary frequency    Vitamin D deficiency    Past Surgical History:  Procedure Laterality Date   APPENDECTOMY  AS CHILD   CARDIOVASCULAR STRESS TEST  03-27-2012  DR CRENSHAW   LOW RISK LEXISCAN STUDY-- PROBABLE NORMAL PERFUSION AND SOFT TISSUE ATTENUATION/  NO ISCHEMIA/ EF 51%   CAROTID ENDARTERECTOMY Bilateral LEFT  11-12-2008  DR GREG HAYES   RIGHT ICA  2006  ([facility])   CHOLECYSTECTOMY N/A 02/23/2022   Procedure: LAPAROSCOPIC CHOLECYSTECTOMY;   Surgeon: Felicie Morn, MD;  Location: [place];  Service: General;  Laterality: N/A;   CYSTOSCOPY W/ RETROGRADES Bilateral 06/22/2021   Procedure: CYSTOSCOPY WITH RETROGRADE PYELOGRAM;  Surgeon: Franchot Gallo, MD;  Location: [place];  Service: Urology;  Laterality: Bilateral;   CYSTOSCOPY WITH LITHOLAPAXY N/A 02/26/2013   Procedure: CYSTOSCOPY WITH LITHOLAPAXY;  Surgeon: Franchot Gallo, MD;  [facility];  Service: Urology;  Laterality: N/A;   ENDOSCOPIC RETROGRADE CHOLANGIOPANCREATOGRAPHY (ERCP) WITH PROPOFOL N/A 02/22/2022   Procedure: ENDOSCOPIC RETROGRADE CHOLANGIOPANCREATOGRAPHY (ERCP) WITH PROPOFOL;  Surgeon: Carol Ada, MD;  Location: [place];  Service: Gastroenterology;  Laterality: N/A;   EYE SURGERY  Jan. 2016   cataract surgery both eyes   INGUINAL HERNIA REPAIR Right 11-08-2006   IR KYPHO EA ADDL LEVEL THORACIC OR LUMBAR  02/12/2021   IR RADIOLOGIST EVAL & MGMT  02/18/2021   MASS EXCISION N/A 03/03/2016   Procedure: EXCISION OF BACK  MASS;  Surgeon: Stark Klein, MD;  Location: [place];  Service: General;  Laterality: N/A;   MOHS SURGERY Left 1/ 2016   Dr Nevada Crane-- Basal cell   PROSTATE SURGERY     REMOVAL OF STONES  02/22/2022   Procedure: REMOVAL OF STONES;  Surgeon: Carol Ada, MD;  Location: [place];  Service:  Gastroenterology;;   [name]  02/22/2022   Procedure: [name];  Surgeon: Carol Ada, MD;  Location: [facility] ENDOSCOPY;  Service: Gastroenterology;;   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-C N/A 06/22/2021   Procedure: TRANSURETHRAL RESECTION OF BLADDER TUMOR;  Surgeon: Franchot Gallo, MD;  Location: [facility];  Service: Urology;  Laterality: N/A;   TRANSURETHRAL RESECTION OF PROSTATE N/A 02/26/2013   Procedure: TRANSURETHRAL RESECTION OF THE PROSTATE WITH GYRUS INSTRUMENTS;  Surgeon: Franchot Gallo, MD;  Location: [facility];   Service: Urology;  Laterality: N/A;   TRANSURETHRAL RESECTION OF PROSTATE N/A 06/22/2021   Procedure: TRANSURETHRAL RESECTION OF THE PROSTATE (TURP);  Surgeon: Franchot Gallo, MD;  Location: [facility];  Service: Urology;  Laterality: N/A;   Patient Active Problem List   Diagnosis Date Noted   Dysuria 08/03/2022   Nonintractable headache 07/01/2022   Bilateral impacted cerumen 06/24/2022   Rash 06/15/2022   Postoperative ileus ([place]) 02/27/2022   Ileus, postoperative ([place]) 02/26/2022   Choledocholithiasis 02/19/2022   DNR (do not resuscitate) 02/19/2022   Left elbow pain 01/26/2022   Mid back pain on left side 01/26/2022   Rib pain 01/26/2022   Acute pain of left shoulder 11/12/2021   Leukocytes in urine 11/12/2021   Urinary frequency 11/12/2021   Thrush 10/08/2021   Hemiplegia, dominant side S/P CVA (cerebrovascular accident) ([place]) 09/11/2021   Insomnia    Prediabetes    Acute 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 ([place])    Basal ganglia infarction ([place]) 07/29/2021   Transaminitis 07/27/2021   UTI (urinary tract infection) 07/27/2021   CVA (cerebral vascular accident) ([place]) 07/27/2021   Fall 07/27/2021   Hyperglycemia 07/27/2021   Cholelithiasis 07/27/2021   Hypoxia 07/27/2021   Nausea and vomiting [id number]   Acute metabolic encephalopathy [id number]   Normocytic anemia 07/27/2021   Chronic back pain 07/27/2021   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[place]) 06/22/2021   Closed fracture of first lumbar vertebra with routine healing 02/03/2021   Closed fracture of multiple ribs 11/18/2020   Anxiety 01/29/2020   Leg pain, bilateral 01/29/2020   Ingrown toenail 07/13/2019   Lumbar spondylosis 05/02/2018   Pain in left knee 03/09/2018   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   Trochanteric bursitis of left hip 01/16/2018   Preventative health care 09/26/2017   HTN (hypertension) 07/19/2015   Hyperlipidemia 07/19/2015    Great toe pain 02/11/2014   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   Obesity (BMI 30-39.9) 06/25/2013   Renal insufficiency 06/25/2013   Weakness of left arm 06/25/2013   Sebaceous cyst 03/03/2011   Sprain of lumbar region 07/31/2010   Rib pain, left 08/29/2009   Carotid artery stenosis, asymptomatic, bilateral 05/02/2009   Eczema, atopic 05/31/2008   Vitamin D deficiency 03/01/2008   BPH (benign prostatic hyperplasia) 08/06/2007   Fasting hyperglycemia 12/21/2006   History of right MCA infarct 2006    ONSET DATE: Acute exacerbation of chronic symptoms (most recent hospitalization 02/26/22 - 03/12/22, followed by SNF and home health)   REFERRING DIAG:  I63.9 (ICD-10-CM) - Cerebrovascular accident (CVA), unspecified mechanism ([place])  W19.XXXD (ICD-10-CM) - Fall, subsequent encounter    THERAPY DIAG:  Other lack of coordination  Muscle weakness (generalized)  Hemiplegia and hemiparesis following cerebral infarction affecting left non-dominant side (HCC)  Attention and concentration deficit  Other symptoms and signs involving cognitive functions following cerebral infarction  Acute pain of left shoulder  Rationale for Evaluation and Treatment: Rehabilitation  PERTINENT  HISTORY: PMHx includes L basal ganglia and corona radiata infarct 07/27/21, L lower homonymous quadrantanopia; R MCA infarct in 2006 w/ L-sided hemiparesis, NCD, carotid artery disease (bilateral), HTN, HLD,CKD3, vascular dementia, and hx of bladder cancer. He has had falls in 2023, fracturing his clavicle in August, then he had stomach surgery in Sept 2023. He has He needs assist at baseline for ADLs. When last documented in acute rehab after having his gall-bladder removed, (03/09/22) he required moderate to maximum assist x2 for most ADLs (more assist for ADLs the require standing), and they recommended rehab in skilled nursing setting. He apparently got Covid at some point and also attended a SNF. Once he  D/C home, he had some episodes of combativeness at night  with "sun-downing."  He is communicative and appropriate, though appears easily confused and distracted, not fully oriented to time and place (Ax2, states date is 108 and cannot say what city he lives in). His wife provides most details, and he is participative, though a poor historian at times. In a nut shell- he has been suffering from dementia and post-stroke effects for years, and his issues were recently exacerbated in Sept of last year after abdominal surgery. He has less use of Lt "stroke side" and new, ritualistic "ticks" in mouth and Rt hand/arm.  Wife states that they do have a caregiver come M, W, F for 4 hours in the middle of the day to mainly help him bathe and dress, which usually tires him out. He has been more combative towards the end of the day as well.   PRECAUTIONS: Fall and Other: Lt hemi, poor cognition, LATEX and adhesive allergies, etc.  ;WEIGHT BEARING RESTRICTIONS: No   SUBJECTIVE:   SUBJECTIVE STATEMENT: Pt wife reports difficulty getting him out of the car today  Pt accompanied by:  his wife  PAIN:  Are you having pain? Not now at rest Rating: 0/10 at rest now   FALLS: Has patient fallen in last 6 months? Yes. Number of falls at least 1 resulting in clavicle fx (now healed)   LIVING ENVIRONMENT: Lives with: lives with their spouse Lives in: House/apartment Has following equipment at home: manual w/c, 2 w/w, weighted utensils, shower bench (for tub), bed cane, hospital bed, raised toilet, and possibly more  PLOF: Needs assistance with ADLs, Needs assistance with homemaking, Needs assistance with gait, and Needs assistance with transfers  PATIENT GOALS: Top caregiver goals today:  #1- increase personal hygiene skills (brush teeth, shaving) #2- prepare instant coffee or microwave meal #3- improve functional cognitive skills to play games, etc.,  #4- increase Lt arm usage #5- try to help decrease Rt  hand/arm perseveration (flicking, tapping, noise making) #6- Improve fnl tranfers in/out of multiple seats (tub bench, swivel chair, etc.)    OBJECTIVE: (All objective assessments below are from initial evaluation on: 07/07/22 unless otherwise specified.)   HAND DOMINANCE: Right  ADLs: Overall ADLs: Wife states decreased ability since hospitalization in Sept 23.  Transfers/ambulation related to ADLs: Min A transfer, CGA for mobility Eating: set up Grooming: Mod A- needs assist with oral care, shaving, but can comb his hair UB Dressing: Min A  LB Dressing: Dep  Toileting:  Mod  Bathing: Mod  Tub Shower transfers: Min A  Equipment: Radio broadcast assistant, Grab bars, and Feeding equipment  IADLs: completely dependent for years now, except some ability to use microwave which is now not possible after hospitalization   MOBILITY STATUS: Needs Assist: CGA - Min A, Hx of  falls, Festination, difficulty with turns, and neglect on Lt side  POSTURE COMMENTS:  rounded shoulders, forward head, and weight shift left  ACTIVITY TOLERANCE: Activity tolerance: he became tired and somewhat agitated by the end of the OT evaluation today (after PT and OT about 2.5hours with mostly sedate activities in seated positions)  FUNCTIONAL OUTCOME MEASURES: [facility] 17/30  showing problems with visuospatial/executive function, attention, fluency, and orientation. (at least 26/30 is needed to be considered "normal")  [employer] in ADLs: 1/6  (very dependent)  UPPER EXTREMITY ROM:    Active ROM Right eval Left eval  Shoulder scaption 100 85  Shoulder abduction    Shoulder adduction    Shoulder extension    Shoulder internal rotation Reaches small of back Reaches to hip  Shoulder external rotation Reaches back of head  Reaches up to ear  Elbow flexion 150 130  Elbow extension (-10*)  (-35*)  Wrist flexion 35 20  Wrist extension 50 10  (Blank rows = not tested)  UPPER EXTREMITY MMT:    Tested grossly  as "push" and "pull": UE push 4-/5, UE pull 4/5  HAND FUNCTION: Grip strength: Right: 30 lbs; Left: 20 lbs  COORDINATION: Finger Nose Finger test: mildly ataxic but fairly accurate  Box and Blocks:  Right 11blocks, Left 5blocks  MUSCLE TONE:  Rt- normal, Lt- decreased and worsened by inattention/disuse  COGNITION: Overall cognitive status: Impaired and Difficulty to assess due to: severity of deficits; Poor attention, fluency, orientation, and executive function. Recall is fairly good as well as abstraction.  Seems to have personality changes due to vascular dementia- like an OCD type volitional perseveration of tapping with Rt hand that he can somewhat control.  He did become angry and agitated at the end of the session performing car transfer with spouse: he did shout at her.  VISION: Subjective report: no new changes per pt/spouse, uses "cheaters"   VISION ASSESSMENT: Visual fields seem intact though he had latent responses on the left side showing likely inattention/and/neglect, tracking somewhat delayed horizontally and vertically  PERCEPTION: Impaired: Inattention/neglect: does not attend to left visual field and does not attend to left side of body, Body scheme: poor quality of recognition with shaving, etc., and Spatial orientation: decreased in print and in orientation, especially to Lt side  PRAXIS: Not tested specifically but no overt/stated issues using pen, wearing glasses ,etc.   OBSERVATIONS: Pt taps and flicks and does seem to volitionally perseverate on making a rhythm/noise with Rt hand. He states he is aware of it and chooses to do it.  He does also seem to have mild resting and intention tremor.    TODAY'S TREATMENT:                                                                                                                              DATE: 08/17/22 Pt is having increased difficulty with mobility today, shuffling gait, min A for ambulation to OT treatment  room with  mod v.c for larger steps and to reset when shuffling. Supine on mat closed chain shoulder flexion, and chest press, min facilitation Seated edge of mat functional reaching to left to place bean bags into container on higher surface then reaching to place in container on low surface, cognitive/ visual component to scan for specific colors and to name items of the same color, min-mod facilitation. (Pt's RUE was placed in pillowcase to foster increased use of LUE) Flipping large playing cards with LUE and naming card/ suit, min difficulty. Picking up coins to place in container with LUE for increased fine motor coordiantion, min facilitation/ v.c min-mod difficulty. Therapist assisted pt with ambulating to car, min A, mod v.c for step length.  Pt/wife assisted pt into his seat.      08/12/22-Supine closed chain chest press and shoulder flexion with ball , min-mod facilitation and v.c for LUE use Supine trunk rotation passing ball between hands, mo-max facilitation/ v.c Seated edge of mat, functional reaching with LUE to place/ remove  graded clothespins on vertical  target with LUE, mod v.c, facilitation for orientation and grasp of clothespins Simulated donning shirt with "bag" exercise, mod facilitation for LUE Seated reaching to touch ankles to simulate LB dressing, mod v.c Therapist encouraged pt/ wife to have pt assist with pulling up pants, and use of larger grasp with LUE.      PATIENT EDUCATION: Education details: See treatment section above for details- 08/12/22 Person educated: Patient and Spouse Education method: Explanation, Demonstration, Tactile cues, and Verbal cues Education comprehension: verbalized understanding, returned demonstration, verbal cues required, tactile cues required, and needs further education  HOME EXERCISE PROGRAM: Access Code: [email or web address] URL: [email or web address] Date: 07/21/2022 Prepared by: Benito Mccreedy Exercises - Seated Shoulder  Flexion AAROM with Dowel  - 4-6 x daily - 1 sets - 10-15 reps - Seated Shoulder Abduction AAROM with Dowel  - 4-6 x daily - 1 sets - 10-15 reps - Seated Shoulder External Rotation AAROM with Dowel  - 4-6 x daily - 1 sets - 10-15 reps - Seated Bicep Curls with Bar  - 4-6 x daily - 1 sets - 10-15 reps - Seated Shoulder Row with Anchored Resistance  - 4-6 x daily - 1 sets - 10-15 reps     GOALS: Goals reviewed with patient? Yes   SHORT TERM GOALS: (STG required if POC>30 days) Target Date: 07/23/22   Pt will demo/state understanding of initial HEP (or home activity suggestions to maintain function as appropriate) to improve functional skills. Goal status: 07/28/22- Met (addressed in past 2 sessions)   LONG TERM GOALS: Target Date: 08/20/22  Pt will improve functional ability by decreased impairment per Redmond Baseman assessment from 1/6 to 2/6 or better, for better quality of life. Goal status:  ongoing  2.  Caregiver will state his grooming ability/hygiene ability improved from moderate assist to supervision assist. Goal status: ongoing  3.  He will show better functional use of left arm by improved box and blocks Test score from 5 blocks to at least 10 blocks. Goal status:ongoing  4.  Pt will show ability to microwave instant coffee or small meal safely, to help decrease caregiver burden. Goal status: ongoing- 08/10/22-pt's wife reports pt is performing with supervision at home  5.  He will show improved cognitive ability enough to play a simple game with OT and caregiver. Goal status: ongoing  6.  Caregiver will state at least 4 ways to decrease perseveration with right arm, also stating  that this behaviors at least somewhat better in the home now. Goal status: ongoing   ASSESSMENT:  CLINICAL IMPRESSION: Pt is progressing towards goals with improving LUE functional use today. Pt is having increased difficulty with mobility today.  OT DURATION: 6 weeks (through 08/20/22 as  needed)   PLANNED INTERVENTIONS: self care/ADL training, therapeutic exercise, therapeutic activity, neuromuscular re-education, manual therapy, balance training, functional mobility training, moist heat, cryotherapy, patient/family education, cognitive remediation/compensation, visual/perceptual remediation/compensation, psychosocial skills training, energy conservation, coping strategies training, DME and/or AE instructions, and Re-evaluation  RECOMMENDED OTHER SERVICES: He has already seen PT for balance and functional mobility, he would probably benefit from speech for cognitive issues as well, though OT will be working with cognition somewhat functionally now.  CONSULTED AND AGREED WITH PLAN OF CARE: Patient and family member/caregiver  PLAN FOR NEXT SESSION:  Continue working towards  long term goals, start checking long term goals,  practice pulling pants over legs, functional use of LUE Teaghan Melrose, OTR/L 4:16 PM 08/17/22

## 2022-08-18 ENCOUNTER — Ambulatory Visit: Payer: PPO | Admitting: Physical Therapy

## 2022-08-18 ENCOUNTER — Ambulatory Visit: Payer: PPO | Admitting: Occupational Therapy

## 2022-08-18 ENCOUNTER — Encounter: Payer: Self-pay | Admitting: Physical Therapy

## 2022-08-18 DIAGNOSIS — R262 Difficulty in walking, not elsewhere classified: Secondary | ICD-10-CM

## 2022-08-18 DIAGNOSIS — R278 Other lack of coordination: Secondary | ICD-10-CM | POA: Diagnosis not present

## 2022-08-18 DIAGNOSIS — I69354 Hemiplegia and hemiparesis following cerebral infarction affecting left non-dominant side: Secondary | ICD-10-CM

## 2022-08-18 DIAGNOSIS — R296 Repeated falls: Secondary | ICD-10-CM

## 2022-08-18 DIAGNOSIS — M6281 Muscle weakness (generalized): Secondary | ICD-10-CM

## 2022-08-18 DIAGNOSIS — Z9181 History of falling: Secondary | ICD-10-CM

## 2022-08-18 DIAGNOSIS — R2689 Other abnormalities of gait and mobility: Secondary | ICD-10-CM

## 2022-08-18 NOTE — Therapy (Signed)
OUTPATIENT PHYSICAL THERAPY NEURO Treatment   Patient Name: William [name] MRN: [id number] DOB:Oct 27, 1940, 82 y.o., male Today's Date: 08/18/2022  PCP: Lowne Chase, [place] PROVIDER: same  END OF SESSION:  PT End of Session - 08/18/22 1537     Visit Number 13    Date for PT Re-Evaluation 09/29/22    PT Start Time 9    PT Stop Time 1610    PT Time Calculation (min) 42 min    Equipment Utilized During Treatment Gait belt    Activity Tolerance Patient tolerated treatment well    Behavior During Therapy Impulsive;Flat affect             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   HTN (hypertension) 07/19/2015   Hyperlipidemia    Major neurocognitive disorder 01/09/2014   Mild, related to stroke history   Nocturia    Renal insufficiency 06/25/2013   S/P carotid endarterectomy    BILATERAL ICA--  PATENT PER DUPLEX  05-19-2012   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/26/2017   Right calf   Urinary frequency    Vitamin D deficiency    Past Surgical History:  Procedure Laterality Date   APPENDECTOMY  AS CHILD   CARDIOVASCULAR STRESS TEST  03-27-2012  DR CRENSHAW   LOW RISK LEXISCAN STUDY-- PROBABLE NORMAL PERFUSION AND SOFT TISSUE ATTENUATION/  NO ISCHEMIA/ EF 51%   CAROTID ENDARTERECTOMY Bilateral LEFT  11-12-2008  DR GREG HAYES   RIGHT ICA  2006  ([facility])   CHOLECYSTECTOMY N/A 02/23/2022   Procedure: LAPAROSCOPIC CHOLECYSTECTOMY;  Surgeon: Felicie Morn, MD;  Location: [place];  Service: General;  Laterality: N/A;   CYSTOSCOPY W/  RETROGRADES Bilateral 06/22/2021   Procedure: CYSTOSCOPY WITH RETROGRADE PYELOGRAM;  Surgeon: Franchot Gallo, MD;  Location: [place];  Service: Urology;  Laterality: Bilateral;   CYSTOSCOPY WITH LITHOLAPAXY N/A 02/26/2013   Procedure: CYSTOSCOPY WITH LITHOLAPAXY;  Surgeon: Franchot Gallo, MD;  Location: [facility];  Service: Urology;  Laterality: N/A;   ENDOSCOPIC RETROGRADE CHOLANGIOPANCREATOGRAPHY (ERCP) WITH PROPOFOL N/A 02/22/2022   Procedure: ENDOSCOPIC RETROGRADE CHOLANGIOPANCREATOGRAPHY (ERCP) WITH PROPOFOL;  Surgeon: Carol Ada, MD;  Location: [place];  Service: Gastroenterology;  Laterality: N/A;   EYE SURGERY  Jan. 2016   cataract surgery both eyes   INGUINAL HERNIA REPAIR Right 11-08-2006   IR KYPHO EA ADDL LEVEL THORACIC OR LUMBAR  02/12/2021   IR RADIOLOGIST EVAL & MGMT  02/18/2021   MASS EXCISION N/A 03/03/2016   Procedure: EXCISION OF BACK  MASS;  Surgeon: Stark Klein, MD;  Location: [place];  Service: General;  Laterality: N/A;   MOHS SURGERY Left 1/ 2016   Dr Nevada Crane-- Basal cell   PROSTATE SURGERY     REMOVAL OF STONES  02/22/2022   Procedure: REMOVAL OF STONES;  Surgeon: Carol Ada, MD;  Location: [place];  Service: Gastroenterology;;   [name]  02/22/2022   Procedure: [name];  Surgeon: Carol Ada, MD;  Location: [place];  Service: Gastroenterology;;   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-C N/A 06/22/2021   Procedure: TRANSURETHRAL RESECTION OF BLADDER TUMOR;  Surgeon: Franchot Gallo, MD;  Location[facility];  Service: Urology;  Laterality: N/A;   TRANSURETHRAL RESECTION OF PROSTATE N/A 02/26/2013   Procedure: TRANSURETHRAL RESECTION OF THE PROSTATE WITH GYRUS INSTRUMENTS;  Surgeon: Franchot Gallo, MD;  Location: [facility];  Service: Urology;  Laterality: N/A;   TRANSURETHRAL RESECTION OF PROSTATE N/A 06/22/2021   Procedure:  TRANSURETHRAL RESECTION OF THE PROSTATE (TURP);  Surgeon: Franchot Gallo, MD;  Location: [facility];  Service: Urology;  Laterality: N/A;   Patient Active Problem List   Diagnosis Date Noted   Dysuria 08/03/2022   Nonintractable headache 07/01/2022   Bilateral impacted cerumen 06/24/2022   Rash 06/15/2022   Postoperative ileus ([place]) 02/27/2022   Ileus, postoperative ([place]) 02/26/2022   Choledocholithiasis 02/19/2022   DNR (do not resuscitate) 02/19/2022   Left elbow pain 01/26/2022   Mid back pain on left side 01/26/2022   Rib pain 01/26/2022   Acute pain of left shoulder 11/12/2021   Leukocytes in urine 11/12/2021   Urinary frequency 11/12/2021   Thrush 10/08/2021   Hemiplegia, dominant side S/P CVA (cerebrovascular accident) ([place]) 09/11/2021   Insomnia    Prediabetes    Acute 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 ([place])    Basal ganglia infarction ([place]) 07/29/2021   Transaminitis 07/27/2021   UTI (urinary tract infection) 07/27/2021   CVA (cerebral vascular accident) ([place]) 07/27/2021   Fall 07/27/2021   Hyperglycemia 07/27/2021   Cholelithiasis 07/27/2021   Hypoxia 07/27/2021   Nausea and vomiting [id number]   Acute metabolic encephalopathy [id number]   Normocytic anemia 07/27/2021   Chronic back pain 07/27/2021   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[place]) 06/22/2021   Closed fracture of first lumbar vertebra with routine healing 02/03/2021   Closed fracture of multiple ribs 11/18/2020   Anxiety 01/29/2020   Leg pain, bilateral 01/29/2020   Ingrown toenail 07/13/2019   Lumbar spondylosis 05/02/2018   Pain in left knee 03/09/2018   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   Trochanteric bursitis of left hip 01/16/2018   Preventative health care 09/26/2017   HTN (hypertension) 07/19/2015   Hyperlipidemia 07/19/2015   Great toe pain 02/11/2014   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   Obesity (BMI  30-39.9) 06/25/2013   Renal insufficiency 06/25/2013   Weakness of left arm 06/25/2013   Sebaceous cyst 03/03/2011   Sprain of lumbar region 07/31/2010   Rib pain, left 08/29/2009   Carotid artery stenosis, asymptomatic, bilateral 05/02/2009   Eczema, atopic 05/31/2008   Vitamin D deficiency 03/01/2008   BPH (benign prostatic hyperplasia) 08/06/2007   Fasting hyperglycemia 12/21/2006   History of right MCA infarct 2006    ONSET DATE: 06/29/22  REFERRING DIAG:  Diagnosis  I63.9 (ICD-10-CM) - Cerebrovascular accident (CVA), unspecified mechanism ([place])  W19.XXXD (ICD-10-CM) - Fall, subsequent encounter    THERAPY DIAG:  Muscle weakness (generalized)  Hemiplegia and hemiparesis following cerebral infarction affecting left non-dominant side (HCC)  History of falling  Other abnormalities of gait and mobility  Difficulty in walking, not elsewhere classified  Repeated falls  Rationale for Evaluation and Treatment: Rehabilitation  SUBJECTIVE:  SUBJECTIVE STATEMENT: wife reports that he was leaning earlier today to the left, she reports that he is walking well today.  He is needing a lot of cues for transfers today, needs step by step instruction and some help PERTINENT HISTORY:  AMARII NINI is a 82 year old man with dementia, CKD, HTN, CAD, HLD and history of CVA  (2006, 2021), Parkinson's  PAIN:  Are you having pain? No  PRECAUTIONS: Fall  WEIGHT BEARING RESTRICTIONS: No  FALLS: Has patient fallen in last 6 months? No  LIVING ENVIRONMENT: Lives with: lives with their family and lives with their spouse Lives in: House/apartment Stairs:  1 brick high step Has following equipment at home: Environmental consultant - 2 wheeled, Wheelchair (manual), Shower bench, bed side commode, Grab bars, and hospital  bed, sliding pad for car  PLOF: Independent with basic ADLs and Independent with household mobility without device  PATIENT GOALS: Walk around the block with his wife. Be able to use the dining room chair rather than have to use the W/C. Increased I with bed mobility and simple tasks at home like make some coffee or brush his teeth, Step over tub to shower, has bench, but he can't really use it. Up and down the one step to get to back deck.  OBJECTIVE:   DIAGNOSTIC FINDINGS:  MRI 2021 with degenerative changes in lumbar spine, L3-4 R subarticular stenosis  COGNITION: Overall cognitive status: History of cognitive impairments - at baseline   SENSATION: Not tested  COORDINATION: Moderately impaired BLE   MUSCLE LENGTH: Hamstrings: severely restricted B Thomas test: Severely restricted B.  POSTURE: rounded shoulders, forward head, increased thoracic kyphosis, posterior pelvic tilt, and flexed trunk   LOWER EXTREMITY ROM:   BLE extremely stiff throughout, limited hip ROM in all planes, B knees limited in extension.   LOWER EXTREMITY MMT:  3+/5 throughout. Unable to determine accuracy of MMT due to cognition. Functionally demosntrates poor coordinated activation and poor muscular endurance.   BED MOBILITY: Min A for Supine<> Sit. Patient was using a ladder in his hospital bed and could move MI.  TRANSFERS: Min A, mod VC, tends to lean back.  CURB: Min A-Has one step out to his deck.  STAIRS: N/A   GAIT: Gait pattern: step to pattern, decreased arm swing- Right, decreased arm swing- Left, decreased step length- Right, decreased step length- Left, shuffling, festinating, trunk flexed, narrow BOS, poor foot clearance- Right, and poor foot clearance- Left Distance walked: 60' Assistive device utilized: Environmental consultant - 2 wheeled Level of assistance: Occasional min A Comments: mod VC to stay close to walker, take longer steps.  FUNCTIONAL TESTS:  5 times sit to stand: 38.23 Timed up  and go (TUG): 47.91  with RW, 07/21/22 TUG 29 seconds without device  TODAY'S TREATMENT:  DATE:  08/18/22 UBE level 1 x 4 minutes Bike level 1 x 5 minutes Nustep level 4 x 5 minutes Sit to stand from mat table slightly raised a lot of cues Side stepping Forward and backward walking, direction changes Seated elbow touches Supine, trunk rotations, partial sit ups, left leg hip abduction with use of sliding board, feet on ball K2C, trunk rotation, bridges and isometric abs Walking 200 feet with cues  08/16/22 Standing hip marches and hip abduction and extension in Pbars with a lot of cues for the movements Stairs step over step with one hand rail and verbal cues to assure clearing the toe UBE level 3 x 5 minutes Leg press with a lot of help getting on and off 20# a lot of cues needed for bigger motions Side stepping, forward and back ward walking with HHA Gait outside down small slope, down and up curb then back with a rest with CGA and a lot of cues Gait outside the front 200 feet to the car  08/11/22 Side stepping in pbars Marches, hip abduction, extension Fast walking Stairs 4" and 6" Nustep level 5 x 6 minutes Sit to stand multiple times and multiple ways On airex head turns, reaching Seated volleyball, standing volleyball Seated ball kicks Gait outside around the [place] and to the car   08/09/22 NuStep L5 x 6 minutes Functional status re-assessed for PN-see goals. Balance training-standing on Air ex pad in parallel bars, rotation to L, holding therapist's hand only for balance, up to min A.   08/04/22 Met him at the car, helped with the transfer out of the car, then walked outside with cues for big smooth steps around the [place] Bike level 3 x 6 minutes Nustep Level 5 x 5 minutes Gait outside down and up two curbs CGA a lot of cues for steps and to  slow down Sit to stand with a lot of cues for nose over toes Seated volleyball, some standing with knees touching bed In pbars sides stepping, backward walking, marching When leaving walked out and around the [place] again to the car, much more assist and cues as he was tired and started shuffling Some bed mobility in a single room without distractions  08/02/22 NuStep L5 x 6 minutes Supine rotational stretch to trunk, knees in hooklying, rotate to L, reach with L arm to R and back x 5, repeat to the R. Roll to L side lie, reach with R arm into rotation forward and back, tried on L, but his L hip hurt in side lie. Seated trunk rotation to R, reach with L hand, sliding out on mat and back x 5, repeat to R. Seated forward flexion over physioball 5 x 3 sec holds Ambulation with 2# WATE bar held at chest level, emphasizing long steps, U UE support. 12' B side stepping in parallel bars, on airex plank with BUE support, 3 x each direction. Walking in figure 8 around 2 cones on the floor, facing 1 direction at all times to ensure changing directions, CGA, max VC, mildly unsteady. Alternating step taps on 2, 6" steps for balance.  07/28/22 NuStep L3 x 6 minutes. Stand with back to corner. Use BUE to lift red physioball and rotate to R to touch target on wall, return to cneter, then repeat to L. Walk with ball, reaching straight up to touch overhead target without LOB, CGA for both. Side to side step over 2" obstacle on the floor. He was able to move side to  side, but was not able to follow the commands for safe performance. Step over air ex planks on the floor, using R, then L legs, BUE support on parallel bars. Stable Climbing up and down steps using step over step technique. Required cues to move L hand along with his feet. 2# weight in BUE, reach down to knee height to touch mat, turn to R and up to touch target on wall. 10 x in each direction. Good reaching above shoulder height, needed cues to  engage L hand.  07/26/22 Nustep Level 5 x 6 minutes Sit to stand with various heights x 5 each a lot of verbal cues needed and at times some assist Volley ball sitting and standing, some ball kicking Gait without device 3x 150 feet with cues for step length and speed also distractions In Pbars side steps, big steps and backward steps In pbars head and trunk rotations Sitting and standing reaching for cones iwht each hand Supine rolling to sides, feet on ball trunk rotation, bridges Bridges without ball Sitting trunk elongations with reaching and twisting Holding onto rail 12 inch toe touches  07/21/22 Nustep level 5 x 6 minutes TUG 29 seconds without device SBA Volleyball sitting, standing with legs touching table and then without Partial sit ups 6" toe touches Cone touches working on weight shifts, body turns and reaching for balance 4" step ups, close CGA and at times mod A due to LOB, needed a lot of redirecting to stay on task as he is easily distracted Ball kicks with back of knees touching the table Side stepping Walking without device.  Outside around [place] and to his car with wife following with w/c  07/19/22:   Nustep L5 5 min, for tissue perfusion, endurance, coordination At steps in smaller room:  Standing for toe raises 10x Tandem standing forward foot on airex, with trunk rotation to each side, to focus on visual cue( waved to his wife) attempted to perform without UE support  Full turn R and L , x 2, with patient cued to place himself in front of mirror-mirror used for feedback to improve spatial awareness In ll bars  forward steps over airex balance beam  4 lengths, with 2 airex balance beams , cues to increase step length particularly on L Standing lateral step overs airex balance beam cues to place both feet over, patient tends to avoid placing second foot down  Seated for forward rolls with 65 cm therapeutic ball , with partial standing, 10 reps  Seated high  marching with alternate arm punches, one 30 sec bout, patinet unable to really complete effectively Standing at steps with 21/2# cuff weight on each leg, for hip flex/ext and hip abd, 10 x each Gait without device 2 separate sets, 100'. Verbal cues to elongate step length B PATIENT EDUCATION: Education details: POC Person educated: Patient and Spouse Education method: Explanation Education comprehension: verbalized understanding  HOME EXERCISE PROGRAM: TBD  GOALS: Goals reviewed with patient? Yes  SHORT TERM GOALS: Target date: 07/19/22  I with initial HEP Baseline: Goal status: ongoing  2. Decrease 5 x STS to < 25 sec to demonstrate improved functional strength  Baseline: 38 sec  Goal status: 08/09/22 14 sec-met  3. Decrease TUG to < 35 sec to demonstrate improved functional balance  Baseline: 47 sec  Goal status: met 07/21/22  4. Perform sit <> stand transfers from average chair height with S and VC to be able to use the swivel chair at his dining  table.  Baseline: Min A  Goal status: 08/09/22-Inconsistently performs  with S from average height. At times requires CGA to cue forward weight shift. ongoing  LONG TERM GOALS: Target date: 09/29/22  Patient will be able to step over his tub at home using grab bars for stability with CGA to access his shower. Baseline: Unable- has bench, but wife reports he steps over then sits. Goal status: ongoing 08/09/22  2.  Decrease 5 x STS to < 18 sec to demonstrate improved functional strength Baseline:  Goal status: 15-2/26/24-met  3.  Decrease TUG to < 25 sec to demonstrate improved functional balance Baseline:  Goal status: 08/09/22  19 sec-met  4.  Patient will perform his bed mobility with MI. Baseline: Min A Goal status: 08/09/22- I as demo on mat-met  5.  Patient will ambulate at least 400' with LRAD, CGA, demonstrate improved upright posture, longer step length. Baseline:  Goal status: 08/09/22-Amb 400', but began to fatigue  after about 300' with more flexed posture, L foot shuffling more, unable to maintain safe technique. ongoing  6.  Patient will manage up and down a single step with LRAD, CGA to access his deck at home. Baseline: Unable Goal status: 08/09/22-Up and down 1 step with U rail. ongoing  ASSESSMENT:  CLINICAL IMPRESSION: Patient  continues to have a lot of difficulty with concentration and attention, he does have difficulty standing, he did seem to do better today with the cues nose over toes, We worked on his core today and I did get him on the bike and he did very well with a good pace OBJECTIVE IMPAIRMENTS: Abnormal gait, decreased activity tolerance, decreased balance, decreased cognition, decreased coordination, decreased endurance, decreased mobility, difficulty walking, decreased ROM, decreased strength, decreased safety awareness, impaired flexibility, impaired UE functional use, improper body mechanics, and postural dysfunction.   ACTIVITY LIMITATIONS: carrying, lifting, bending, sitting, standing, squatting, stairs, transfers, bed mobility, bathing, toileting, dressing, hygiene/grooming, and locomotion level  PARTICIPATION LIMITATIONS: meal prep, cleaning, laundry, interpersonal relationship, shopping, and community activity  PERSONAL FACTORS: Age, Past/current experiences, and 3+ comorbidities: CVA, Parkinson's traits, dementia  are also affecting patient's functional outcome.   REHAB POTENTIAL: Good  CLINICAL DECISION MAKING: Evolving/moderate complexity  EVALUATION COMPLEXITY: Moderate  PLAN:  PT FREQUENCY: 2x/week  PT DURATION: 12 weeks  PLANNED INTERVENTIONS: Therapeutic exercises, Therapeutic activity, Neuromuscular re-education, Balance training, Gait training, Patient/Family education, Self Care, Joint mobilization, Stair training, Moist heat, and Manual therapy  PLAN FOR NEXT SESSION: Trunk and extremity mobilization, balance, strength, functional mobility  re-education.  Lum Babe, PT 08/18/22 3:38 PM

## 2022-08-19 ENCOUNTER — Ambulatory Visit: Payer: PPO | Admitting: Occupational Therapy

## 2022-08-19 DIAGNOSIS — I6381 Other cerebral infarction due to occlusion or stenosis of small artery: Secondary | ICD-10-CM | POA: Diagnosis not present

## 2022-08-19 DIAGNOSIS — S2249XA Multiple fractures of ribs, unspecified side, initial encounter for closed fracture: Secondary | ICD-10-CM | POA: Diagnosis not present

## 2022-08-19 DIAGNOSIS — N184 Chronic kidney disease, stage 4 (severe): Secondary | ICD-10-CM | POA: Diagnosis not present

## 2022-08-19 DIAGNOSIS — G9341 Metabolic encephalopathy: Secondary | ICD-10-CM | POA: Diagnosis not present

## 2022-08-19 DIAGNOSIS — M47816 Spondylosis without myelopathy or radiculopathy, lumbar region: Secondary | ICD-10-CM | POA: Diagnosis not present

## 2022-08-20 ENCOUNTER — Other Ambulatory Visit (HOSPITAL_BASED_OUTPATIENT_CLINIC_OR_DEPARTMENT_OTHER): Payer: Self-pay

## 2022-08-20 DIAGNOSIS — M47816 Spondylosis without myelopathy or radiculopathy, lumbar region: Secondary | ICD-10-CM | POA: Diagnosis not present

## 2022-08-20 DIAGNOSIS — N184 Chronic kidney disease, stage 4 (severe): Secondary | ICD-10-CM | POA: Diagnosis not present

## 2022-08-20 DIAGNOSIS — I6381 Other cerebral infarction due to occlusion or stenosis of small artery: Secondary | ICD-10-CM | POA: Diagnosis not present

## 2022-08-20 DIAGNOSIS — G9341 Metabolic encephalopathy: Secondary | ICD-10-CM | POA: Diagnosis not present

## 2022-08-20 DIAGNOSIS — S2249XA Multiple fractures of ribs, unspecified side, initial encounter for closed fracture: Secondary | ICD-10-CM | POA: Diagnosis not present

## 2022-08-23 ENCOUNTER — Other Ambulatory Visit (HOSPITAL_BASED_OUTPATIENT_CLINIC_OR_DEPARTMENT_OTHER): Payer: Self-pay

## 2022-08-23 ENCOUNTER — Telehealth: Payer: Self-pay | Admitting: *Deleted

## 2022-08-23 ENCOUNTER — Ambulatory Visit: Payer: PPO | Admitting: Occupational Therapy

## 2022-08-23 DIAGNOSIS — R3915 Urgency of urination: Secondary | ICD-10-CM | POA: Diagnosis not present

## 2022-08-23 DIAGNOSIS — C678 Malignant neoplasm of overlapping sites of bladder: Secondary | ICD-10-CM | POA: Diagnosis not present

## 2022-08-23 DIAGNOSIS — N401 Enlarged prostate with lower urinary tract symptoms: Secondary | ICD-10-CM | POA: Diagnosis not present

## 2022-08-23 NOTE — Therapy (Unsigned)
OUTPATIENT OCCUPATIONAL THERAPY TREATMENT NOTE  Patient Name: William [name] MRN: [id number] DOB:1940-07-09, 82 y.o., male Today's Date: 08/24/2022  PCP & REFERRING PROVIDER:  Carollee Herter, Alferd Apa, DO    END OF SESSION:  OT End of Session - 08/24/22 1057     Visit Number 8    Number of Visits 32    Date for OT Re-Evaluation 11/16/22    Authorization Type [employer]    OT Start Time 1100    OT Stop Time 1145    OT Time Calculation (min) 45 min    Activity Tolerance Patient tolerated treatment well;No increased pain    Behavior During Therapy Impulsive                  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   HTN (hypertension) 07/19/2015   Hyperlipidemia    Major neurocognitive disorder 01/09/2014   Mild, related to stroke history   Nocturia    Renal insufficiency 06/25/2013   S/P carotid endarterectomy    BILATERAL ICA--  PATENT PER DUPLEX  05-19-2012   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/26/2017   Right calf   Urinary frequency    Vitamin D deficiency    Past Surgical History:  Procedure Laterality Date   APPENDECTOMY  AS CHILD   CARDIOVASCULAR STRESS TEST  03-27-2012  DR CRENSHAW   LOW RISK LEXISCAN STUDY-- PROBABLE NORMAL PERFUSION AND SOFT TISSUE ATTENUATION/  NO ISCHEMIA/ EF 51%   CAROTID ENDARTERECTOMY Bilateral LEFT  11-12-2008  DR GREG HAYES   RIGHT ICA  2006  ([facility])   CHOLECYSTECTOMY N/A 02/23/2022   Procedure: LAPAROSCOPIC CHOLECYSTECTOMY;  Surgeon: Felicie Morn, MD;  Location: [place];  Service: General;   Laterality: N/A;   CYSTOSCOPY W/ RETROGRADES Bilateral 06/22/2021   Procedure: CYSTOSCOPY WITH RETROGRADE PYELOGRAM;  Surgeon: Franchot Gallo, MD;  Location: [place];  Service: Urology;  Laterality: Bilateral;   CYSTOSCOPY WITH LITHOLAPAXY N/A 02/26/2013   Procedure: CYSTOSCOPY WITH LITHOLAPAXY;  Surgeon: Franchot Gallo, MD;  Location: [facility];  Service: Urology;  Laterality: N/A;   ENDOSCOPIC RETROGRADE CHOLANGIOPANCREATOGRAPHY (ERCP) WITH PROPOFOL N/A 02/22/2022   Procedure: ENDOSCOPIC RETROGRADE CHOLANGIOPANCREATOGRAPHY (ERCP) WITH PROPOFOL;  Surgeon: Carol Ada, MD;  Location: [place];  Service: Gastroenterology;  Laterality: N/A;   EYE SURGERY  Jan. 2016   cataract surgery both eyes   INGUINAL HERNIA REPAIR Right 11-08-2006   IR KYPHO EA ADDL LEVEL THORACIC OR LUMBAR  02/12/2021   IR RADIOLOGIST EVAL & MGMT  02/18/2021   MASS EXCISION N/A 03/03/2016   Procedure: EXCISION OF BACK  MASS;  Surgeon: Stark Klein, MD;  Location: [place];  Service: General;  Laterality: N/A;   MOHS SURGERY Left 1/ 2016   Dr Nevada Crane-- Basal cell   PROSTATE SURGERY     REMOVAL OF STONES  02/22/2022   Procedure: REMOVAL OF STONES;  Surgeon: Carol Ada, MD;  Location: [place];  Service: Gastroenterology;;   [name]  02/22/2022  Procedure: SPHINCTEROTOMY;  Surgeon: Carol Ada, MD;  Location: [facility] ENDOSCOPY;  Service: Gastroenterology;;   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-C N/A 06/22/2021   Procedure: TRANSURETHRAL RESECTION OF BLADDER TUMOR;  Surgeon: Franchot Gallo, MD;  Location: [facility];  Service: Urology;  Laterality: N/A;   TRANSURETHRAL RESECTION OF PROSTATE N/A 02/26/2013   Procedure: TRANSURETHRAL RESECTION OF THE PROSTATE WITH GYRUS INSTRUMENTS;  Surgeon: Franchot Gallo, MD;  Location: [facility];  Service: Urology;  Laterality: N/A;   TRANSURETHRAL RESECTION OF PROSTATE  N/A 06/22/2021   Procedure: TRANSURETHRAL RESECTION OF THE PROSTATE (TURP);  Surgeon: Franchot Gallo, MD;  Location: [facility];  Service: Urology;  Laterality: N/A;   Patient Active Problem List   Diagnosis Date Noted   Dysuria 08/03/2022   Nonintractable headache 07/01/2022   Bilateral impacted cerumen 06/24/2022   Rash 06/15/2022   Postoperative ileus ([place]) 02/27/2022   Ileus, postoperative ([place]) 02/26/2022   Choledocholithiasis 02/19/2022   DNR (do not resuscitate) 02/19/2022   Left elbow pain 01/26/2022   Mid back pain on left side 01/26/2022   Rib pain 01/26/2022   Acute pain of left shoulder 11/12/2021   Leukocytes in urine 11/12/2021   Urinary frequency 11/12/2021   Thrush 10/08/2021   Hemiplegia, dominant side S/P CVA (cerebrovascular accident) ([place]) 09/11/2021   Insomnia    Prediabetes    Acute 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 ([place])    Basal ganglia infarction ([place]) 07/29/2021   Transaminitis 07/27/2021   UTI (urinary tract infection) 07/27/2021   CVA (cerebral vascular accident) ([place]) 07/27/2021   Fall 07/27/2021   Hyperglycemia 07/27/2021   Cholelithiasis 07/27/2021   Hypoxia 07/27/2021   Nausea and vomiting [id number]   Acute metabolic encephalopathy [id number]   Normocytic anemia 07/27/2021   Chronic back pain 07/27/2021   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[place]) 06/22/2021   Closed fracture of first lumbar vertebra with routine healing 02/03/2021   Closed fracture of multiple ribs 11/18/2020   Anxiety 01/29/2020   Leg pain, bilateral 01/29/2020   Ingrown toenail 07/13/2019   Lumbar spondylosis 05/02/2018   Pain in left knee 03/09/2018   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   Trochanteric bursitis of left hip 01/16/2018   Preventative health care 09/26/2017   HTN (hypertension) 07/19/2015   Hyperlipidemia 07/19/2015   Great toe pain 02/11/2014   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   Obesity (BMI 30-39.9) 06/25/2013   Renal insufficiency 06/25/2013   Weakness of left arm 06/25/2013   Sebaceous cyst 03/03/2011   Sprain of lumbar region 07/31/2010   Rib pain, left 08/29/2009   Carotid artery stenosis, asymptomatic, bilateral 05/02/2009   Eczema, atopic 05/31/2008   Vitamin D deficiency 03/01/2008   BPH (benign prostatic hyperplasia) 08/06/2007   Fasting hyperglycemia 12/21/2006   History of right MCA infarct 2006    ONSET DATE: Acute exacerbation of chronic symptoms (most recent hospitalization 02/26/22 - 03/12/22, followed by SNF and home health)   REFERRING DIAG:  I63.9 (ICD-10-CM) - Cerebrovascular accident (CVA), unspecified mechanism ([place])  W19.XXXD (ICD-10-CM) - Fall, subsequent encounter    THERAPY DIAG:  Muscle weakness (generalized) - Plan: Ot plan of care cert/re-cert  Hemiplegia and hemiparesis following cerebral infarction affecting left non-dominant side ([place]) - Plan: Ot plan of care cert/re-cert  Other abnormalities of gait and mobility - Plan: Ot plan of care cert/re-cert  Other lack of coordination - Plan: Ot plan of care cert/re-cert  Attention and concentration  deficit - Plan: Ot plan of care cert/re-cert  Other symptoms and signs involving cognitive functions following cerebral infarction - Plan: Ot plan of care cert/re-cert  Acute pain of left shoulder - Plan: Ot plan of care cert/re-cert  Rationale for Evaluation and Treatment: Rehabilitation  PERTINENT HISTORY: PMHx includes L basal ganglia and corona radiata infarct 07/27/21, L lower homonymous quadrantanopia; R MCA infarct in 2006 w/ L-sided hemiparesis, NCD, carotid artery disease (bilateral), HTN, HLD,CKD3, vascular dementia, and hx of bladder cancer. He has had falls in 2023, fracturing his clavicle in August, then he had stomach surgery in Sept 2023. He has He needs assist at baseline for ADLs. When last documented in acute rehab after having his gall-bladder removed,  (03/09/22) he required moderate to maximum assist x2 for most ADLs (more assist for ADLs the require standing), and they recommended rehab in skilled nursing setting. He apparently got Covid at some point and also attended a SNF. Once he D/C home, he had some episodes of combativeness at night  with "sun-downing."  He is communicative and appropriate, though appears easily confused and distracted, not fully oriented to time and place (Ax2, states date is 74 and cannot say what city he lives in). His wife provides most details, and he is participative, though a poor historian at times. In a nut shell- he has been suffering from dementia and post-stroke effects for years, and his issues were recently exacerbated in Sept of last year after abdominal surgery. He has less use of Lt "stroke side" and new, ritualistic "ticks" in mouth and Rt hand/arm.  Wife states that they do have a caregiver come M, W, F for 4 hours in the middle of the day to mainly help him bathe and dress, which usually tires him out. He has been more combative towards the end of the day as well.   PRECAUTIONS: Fall and Other: Lt hemi, poor cognition, LATEX and adhesive allergies, etc.  ;WEIGHT BEARING RESTRICTIONS: No   SUBJECTIVE:   SUBJECTIVE STATEMENT: Pt reports difficulty opening newspaper  Pt accompanied by:  his wife  PAIN:  Are you having pain? Not now at rest Rating: 0/10 at rest now   FALLS: Has patient fallen in last 6 months? Yes. Number of falls at least 1 resulting in clavicle fx (now healed)   LIVING ENVIRONMENT: Lives with: lives with their spouse Lives in: House/apartment Has following equipment at home: manual w/c, 2 w/w, weighted utensils, shower bench (for tub), bed cane, hospital bed, raised toilet, and possibly more  PLOF: Needs assistance with ADLs, Needs assistance with homemaking, Needs assistance with gait, and Needs assistance with transfers  PATIENT GOALS: at eval- Top caregiver goals today:   #1- increase personal hygiene skills (brush teeth, shaving) #2- prepare instant coffee or microwave meal #3- improve functional cognitive skills to play games, etc.,  #4- increase Lt arm usage #5- try to help decrease Rt hand/arm perseveration (flicking, tapping, noise making) #6- Improve fnl tranfers in/out of multiple seats (tub bench, swivel chair, etc.)    OBJECTIVE: (All objective assessments below are from initial evaluation on: 07/07/22 unless otherwise specified.)   HAND DOMINANCE: Right  ADLs: Overall ADLs: Wife states decreased ability since hospitalization in Sept 23.  Transfers/ambulation related to ADLs: Min A transfer, CGA for mobility Eating: set up Grooming: Mod A- needs assist with oral care, shaving, but can comb his hair UB Dressing: Min A  LB Dressing: Dep  Toileting:  Mod  Bathing: Mod  Tub Shower transfers:  Min A  Equipment: Radio broadcast assistant, Grab bars, and Feeding equipment  IADLs: completely dependent for years now, except some ability to use microwave which is now not possible after hospitalization   MOBILITY STATUS: Needs Assist: CGA - Min A, Hx of falls, Festination, difficulty with turns, and neglect on Lt side  POSTURE COMMENTS:  rounded shoulders, forward head, and weight shift left  ACTIVITY TOLERANCE: Activity tolerance: he became tired and somewhat agitated by the end of the OT evaluation today (after PT and OT about 2.5hours with mostly sedate activities in seated positions)  FUNCTIONAL OUTCOME MEASURES: [facility] 17/30  showing problems with visuospatial/executive function, attention, fluency, and orientation. (at least 26/30 is needed to be considered "normal")  [employer] in ADLs: 1/6  (very dependent)  UPPER EXTREMITY ROM:    Active ROM Right eval Left eval  Shoulder scaption 100 85  Shoulder abduction    Shoulder adduction    Shoulder extension    Shoulder internal rotation Reaches small of back Reaches to hip  Shoulder  external rotation Reaches back of head  Reaches up to ear  Elbow flexion 150 130  Elbow extension (-10*)  (-35*)  Wrist flexion 35 20  Wrist extension 50 10  (Blank rows = not tested)  UPPER EXTREMITY MMT:    Tested grossly as "push" and "pull": UE push 4-/5, UE pull 4/5  HAND FUNCTION: Grip strength: Right: 30 lbs; Left: 20 lbs  COORDINATION: Finger Nose Finger test: mildly ataxic but fairly accurate  Box and Blocks:  Right 11blocks, Left 5blocks  MUSCLE TONE:  Rt- normal, Lt- decreased and worsened by inattention/disuse  COGNITION: Overall cognitive status: Impaired and Difficulty to assess due to: severity of deficits; Poor attention, fluency, orientation, and executive function. Recall is fairly good as well as abstraction.  Seems to have personality changes due to vascular dementia- like an OCD type volitional perseveration of tapping with Rt hand that he can somewhat control.  He did become angry and agitated at the end of the session performing car transfer with spouse: he did shout at her.  VISION: Subjective report: no new changes per pt/spouse, uses "cheaters"   VISION ASSESSMENT: Visual fields seem intact though he had latent responses on the left side showing likely inattention/and/neglect, tracking somewhat delayed horizontally and vertically  PERCEPTION: Impaired: Inattention/neglect: does not attend to left visual field and does not attend to left side of body, Body scheme: poor quality of recognition with shaving, etc., and Spatial orientation: decreased in print and in orientation, especially to Lt side  PRAXIS: Not tested specifically but no overt/stated issues using pen, wearing glasses ,etc.   OBSERVATIONS: Pt taps and flicks and does seem to volitionally perseverate on making a rhythm/noise with Rt hand. He states he is aware of it and chooses to do it.  He does also seem to have mild resting and intention tremor.    TODAY'S TREATMENT:     DATE: 08/24/22  Therapist checked progress towards goals today in preparation for renewal. Pt's wife reports pt has not progressed as quickly as in the past and she would like to focus more on functional activities.  Therapist discussed and developed updated goals with patient and wife. See goals for progress and updates.  Pt's wife brought in several containers and pt practiced opening and closing with min-supervision and min v.c Standing at countertop pt opened coffee container and scooped coffee into a cup with supervision/ minguard for balance and min-mod v.c  Donning and doffing  flannel shirt several repetitions with mod A initially then improved to min A and mod v.c  Ambulating to and from chair with practice backing up, slowing down and reaching back with bilateral UE's to encourage pt to sit more squarely in seat, mod difficulty/ mod v.c and pt still was not completely even in seat but improved.        PATIENT EDUCATION: Education details: See treatment section above for details, progress towards goals and updated goals. Person educated: Patient and Spouse Education method: Explanation, Demonstration, Tactile cues, and Verbal cues Education comprehension: verbalized understanding, returned demonstration, verbal cues required, tactile cues required, and needs further education  HOME EXERCISE PROGRAM: Access Code: [email or web address] URL: [email or web address] Date: 07/21/2022 Prepared by: Benito Mccreedy Exercises - Seated Shoulder Flexion AAROM with Dowel  - 4-6 x daily - 1 sets - 10-15 reps - Seated Shoulder Abduction AAROM with Dowel  - 4-6 x daily - 1 sets - 10-15 reps - Seated Shoulder External Rotation AAROM with Dowel  - 4-6 x daily - 1 sets - 10-15 reps - Seated Bicep Curls with Bar  - 4-6 x daily - 1 sets - 10-15 reps - Seated Shoulder Row with Anchored Resistance  - 4-6 x daily - 1 sets - 10-15 reps     GOALS: Goals reviewed with patient? Yes   SHORT TERM GOALS: (STG  required if POC>30 days) Target Date: 09/23/22   Pt will demo/state understanding of initial HEP (or home activity suggestions to maintain function as appropriate) to improve functional skills. Goal status: 07/28/22- Met (addressed in past 2 sessions) 2. Pt will donn a t-shirt consistently with min A and v.c   Goal status: New 3. Pt will demonstrate ability to open the newspaper    with supervision min v.c Goal status: New 4.  Pt will consistently sustain focus to a simple functional task  in a minimally distracting environment x 6 mins with no more than 1 re-direct(ie: eating, functional task in clinic) Baseline:  Goal status: NEW 5. Pt will consistently open containers with only supervision and min v.c           Goal status:New    LONG TERM GOALS: Target Date: 11/16/22  Pt will improve functional ability by decreased impairment per Redmond Baseman assessment from 1/6 to 2/6 or better, for better quality of life. Goal status:  not met 1/6  2.  Caregiver will state his grooming ability/hygiene ability improved from moderate assist to supervision assist. Goal status: not met-Pt continues to require mod A.  3.  He will show better functional use of left arm by improved box and blocks Test score from 5 blocks to at least 10 blocks. Goal status:not met but improved 8 blocks  4.  Pt will show ability to microwave instant coffee or small meal safely, to help decrease caregiver burden. Goal status: partially met, pt is able to microwave coffee with supervision, however occasional min A to open containers 5.  He will show improved cognitive ability enough to play a simple game with OT and caregiver. Goal status: approximating goal, pt has played a game of uno at home with family.  6.  Caregiver will state at least 4 ways to decrease perseveration with right arm, also stating that this behaviors at least somewhat better in the home now. Goal status: deferred perseverative behaviors have  improved.  7. Pt will donn a flannel button front shirt consistently with min A and min v.c Baseline: mod-max Goal status: NEW 8.  Pt will use his LUE as an active assist for ADLS grossly [id number] of the time with min v.c Baseline: uses 25% Goal status: NEW   9 Pt will stand at the countertop and perform functional activities  incorporating bilateral UE's with supervision and min v.c  Goal status: NEW 10. Pt will back up to a chair and sit down squarely at least 1/3 of the time with min v.c  Goal status: new 11..  Pt will open the newspaper and locate sports section with no more than min v.c                          Goal status: NEW  ASSESSMENT:  CLINICAL IMPRESSION: Pt made some progress towards goals however he did not fully achieve all goals due to severity of his deficits including  cognitive deficits, left neglect, decreased balance and left hemiplegia. Pt can benefit from continued skilled occupational therapy to address these deficits in order to maximize pt's safety and I with ADLs/IADLs.Pt's goals have been updated with a focus on functional activities/ ADLS per discussion with pt/ wife.  OT Frequency/DURATION: 2x week x 12 weeks however anticipate d/c after 8 weeks dependent on progress  PLANNED INTERVENTIONS: self care/ADL training, therapeutic exercise, therapeutic activity, neuromuscular re-education, manual therapy, balance training, functional mobility training, moist heat, cryotherapy, patient/family education, cognitive remediation/compensation, visual/perceptual remediation/compensation, psychosocial skills training, energy conservation, coping strategies training, DME and/or AE instructions, and Re-evaluation  RECOMMENDED OTHER SERVICES: He has already seen PT for balance and functional mobility, he would probably benefit from speech for cognitive issues as well, though OT will be working with cognition somewhat functionally now.  CONSULTED AND AGREED WITH PLAN OF CARE:  Patient and family member/caregiver  PLAN FOR NEXT SESSION:  Reinforce donning shirt, functional activities with bilateral UE's, opening newspaper Theone Murdoch, OTR/L 3:12 PM 08/24/22

## 2022-08-23 NOTE — Progress Notes (Signed)
  Care Coordination   Note   08/23/2022 Name: William [name] MRN: [id number] DOB: 05-23-1941  [name] is a 82 y.o. year old male who sees Carollee Herter, Alferd Apa, DO for primary care. I reached out to Bruna Potter by phone today to offer care coordination services.  [name] was given information about Care Coordination services today including:   The Care Coordination services include support from the care team which includes your Nurse Coordinator, Clinical Social Worker, or Pharmacist.  The Care Coordination team is here to help remove barriers to the health concerns and goals most important to you. Care Coordination services are voluntary, and the patient may decline or stop services at any time by request to their care team member.   Care Coordination Consent Status: Patient did not agree to participate in care coordination services at this time.  Follow up plan:  none indicated - per pt wife, pt already has case manager   Encounter Outcome:  Pt. Refused  Julian Hy, [place] Direct Dial: [phone]

## 2022-08-24 ENCOUNTER — Ambulatory Visit: Payer: PPO | Admitting: Occupational Therapy

## 2022-08-24 ENCOUNTER — Encounter: Payer: Self-pay | Admitting: Physical Therapy

## 2022-08-24 ENCOUNTER — Ambulatory Visit: Payer: PPO | Admitting: Physical Therapy

## 2022-08-24 DIAGNOSIS — R278 Other lack of coordination: Secondary | ICD-10-CM

## 2022-08-24 DIAGNOSIS — I69318 Other symptoms and signs involving cognitive functions following cerebral infarction: Secondary | ICD-10-CM

## 2022-08-24 DIAGNOSIS — R41840 Attention and concentration deficit: Secondary | ICD-10-CM

## 2022-08-24 DIAGNOSIS — R2689 Other abnormalities of gait and mobility: Secondary | ICD-10-CM

## 2022-08-24 DIAGNOSIS — R296 Repeated falls: Secondary | ICD-10-CM

## 2022-08-24 DIAGNOSIS — I69354 Hemiplegia and hemiparesis following cerebral infarction affecting left non-dominant side: Secondary | ICD-10-CM

## 2022-08-24 DIAGNOSIS — Z9181 History of falling: Secondary | ICD-10-CM

## 2022-08-24 DIAGNOSIS — R262 Difficulty in walking, not elsewhere classified: Secondary | ICD-10-CM

## 2022-08-24 DIAGNOSIS — M25512 Pain in left shoulder: Secondary | ICD-10-CM

## 2022-08-24 DIAGNOSIS — M6281 Muscle weakness (generalized): Secondary | ICD-10-CM

## 2022-08-24 NOTE — Therapy (Signed)
OUTPATIENT PHYSICAL THERAPY NEURO Treatment   Patient Name: William [name] MRN: [id number] DOB:Mar 27, 1941, 82 y.o., male Today's Date: 08/24/2022  PCP: Lowne Chase, [place] PROVIDER: same  END OF SESSION:  PT End of Session - 08/24/22 1442     Visit Number 14    Date for PT Re-Evaluation 09/29/22    PT Start Time 1440    PT Stop Time 1526    PT Time Calculation (min) 46 min    Equipment Utilized During Treatment Gait belt    Activity Tolerance Patient tolerated treatment well    Behavior During Therapy Impulsive             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   HTN (hypertension) 07/19/2015   Hyperlipidemia    Major neurocognitive disorder 01/09/2014   Mild, related to stroke history   Nocturia    Renal insufficiency 06/25/2013   S/P carotid endarterectomy    BILATERAL ICA--  PATENT PER DUPLEX  05-19-2012   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/26/2017   Right calf   Urinary frequency    Vitamin D deficiency    Past Surgical History:  Procedure Laterality Date   APPENDECTOMY  AS CHILD   CARDIOVASCULAR STRESS TEST  03-27-2012  DR CRENSHAW   LOW RISK LEXISCAN STUDY-- PROBABLE NORMAL PERFUSION AND SOFT TISSUE ATTENUATION/  NO ISCHEMIA/ EF 51%   CAROTID ENDARTERECTOMY Bilateral LEFT  11-12-2008  DR GREG HAYES   RIGHT ICA  2006  ([facility])   CHOLECYSTECTOMY N/A 02/23/2022   Procedure: LAPAROSCOPIC CHOLECYSTECTOMY;  Surgeon: Felicie Morn, MD;  Location: [place];  Service: General;  Laterality: N/A;   CYSTOSCOPY W/ RETROGRADES  Bilateral 06/22/2021   Procedure: CYSTOSCOPY WITH RETROGRADE PYELOGRAM;  Surgeon: Franchot Gallo, MD;  Location: [place];  Service: Urology;  Laterality: Bilateral;   CYSTOSCOPY WITH LITHOLAPAXY N/A 02/26/2013   Procedure: CYSTOSCOPY WITH LITHOLAPAXY;  Surgeon: Franchot Gallo, MD;  Location: [facility];  Service: Urology;  Laterality: N/A;   ENDOSCOPIC RETROGRADE CHOLANGIOPANCREATOGRAPHY (ERCP) WITH PROPOFOL N/A 02/22/2022   Procedure: ENDOSCOPIC RETROGRADE CHOLANGIOPANCREATOGRAPHY (ERCP) WITH PROPOFOL;  Surgeon: Carol Ada, MD;  Location: [place];  Service: Gastroenterology;  Laterality: N/A;   EYE SURGERY  Jan. 2016   cataract surgery both eyes   INGUINAL HERNIA REPAIR Right 11-08-2006   IR KYPHO EA ADDL LEVEL THORACIC OR LUMBAR  02/12/2021   IR RADIOLOGIST EVAL & MGMT  02/18/2021   MASS EXCISION N/A 03/03/2016   Procedure: EXCISION OF BACK  MASS;  Surgeon: Stark Klein, MD;  Location: [place];  Service: General;  Laterality: N/A;   MOHS SURGERY Left 1/ 2016   Dr Nevada Crane-- Basal cell   PROSTATE SURGERY     REMOVAL OF STONES  02/22/2022   Procedure: REMOVAL OF STONES;  Surgeon: Carol Ada, MD;  Location: [place];  Service: Gastroenterology;;   [name]  02/22/2022   Procedure: [name];  Surgeon: Carol Ada, MD;  Location: [place];  Service: Gastroenterology;;   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-C N/A 06/22/2021   Procedure: TRANSURETHRAL RESECTION OF BLADDER TUMOR;  Surgeon: Franchot Gallo, MD;  Location: [facility];  Service: Urology;  Laterality: N/A;   TRANSURETHRAL RESECTION OF PROSTATE N/A 02/26/2013   Procedure: TRANSURETHRAL RESECTION OF THE PROSTATE WITH GYRUS INSTRUMENTS;  Surgeon: Franchot Gallo, MD;  Location: [facility];  Service: Urology;  Laterality: N/A;   TRANSURETHRAL RESECTION OF PROSTATE N/A 06/22/2021   Procedure: TRANSURETHRAL  RESECTION OF THE PROSTATE (TURP);  Surgeon: Franchot Gallo, MD;  Location: [facility];  Service: Urology;  Laterality: N/A;   Patient Active Problem List   Diagnosis Date Noted   Dysuria 08/03/2022   Nonintractable headache 07/01/2022   Bilateral impacted cerumen 06/24/2022   Rash 06/15/2022   Postoperative ileus ([place]) 02/27/2022   Ileus, postoperative ([place]) 02/26/2022   Choledocholithiasis 02/19/2022   DNR (do not resuscitate) 02/19/2022   Left elbow pain 01/26/2022   Mid back pain on left side 01/26/2022   Rib pain 01/26/2022   Acute pain of left shoulder 11/12/2021   Leukocytes in urine 11/12/2021   Urinary frequency 11/12/2021   Thrush 10/08/2021   Hemiplegia, dominant side S/P CVA (cerebrovascular accident) ([place]) 09/11/2021   Insomnia    Prediabetes    Acute 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 ([place])    Basal ganglia infarction ([place]) 07/29/2021   Transaminitis 07/27/2021   UTI (urinary tract infection) 07/27/2021   CVA (cerebral vascular accident) ([place]) 07/27/2021   Fall 07/27/2021   Hyperglycemia 07/27/2021   Cholelithiasis 07/27/2021   Hypoxia 07/27/2021   Nausea and vomiting [id number]   Acute metabolic encephalopathy [id number]   Normocytic anemia 07/27/2021   Chronic back pain 07/27/2021   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[place]) 06/22/2021   Closed fracture of first lumbar vertebra with routine healing 02/03/2021   Closed fracture of multiple ribs 11/18/2020   Anxiety 01/29/2020   Leg pain, bilateral 01/29/2020   Ingrown toenail 07/13/2019   Lumbar spondylosis 05/02/2018   Pain in left knee 03/09/2018   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   Trochanteric bursitis of left hip 01/16/2018   Preventative health care 09/26/2017   HTN (hypertension) 07/19/2015   Hyperlipidemia 07/19/2015   Great toe pain 02/11/2014   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   Obesity (BMI 30-39.9) 06/25/2013    Renal insufficiency 06/25/2013   Weakness of left arm 06/25/2013   Sebaceous cyst 03/03/2011   Sprain of lumbar region 07/31/2010   Rib pain, left 08/29/2009   Carotid artery stenosis, asymptomatic, bilateral 05/02/2009   Eczema, atopic 05/31/2008   Vitamin D deficiency 03/01/2008   BPH (benign prostatic hyperplasia) 08/06/2007   Fasting hyperglycemia 12/21/2006   History of right MCA infarct 2006    ONSET DATE: 06/29/22  REFERRING DIAG:  Diagnosis  I63.9 (ICD-10-CM) - Cerebrovascular accident (CVA), unspecified mechanism ([place])  W19.XXXD (ICD-10-CM) - Fall, subsequent encounter    THERAPY DIAG:  Muscle weakness (generalized)  Hemiplegia and hemiparesis following cerebral infarction affecting left non-dominant side (HCC)  History of falling  Other abnormalities of gait and mobility  Difficulty in walking, not elsewhere classified  Repeated falls  Rationale for Evaluation and Treatment: Rehabilitation  SUBJECTIVE:  SUBJECTIVE STATEMENT: Patient with good alertness today.  He was able to converse better today PERTINENT HISTORY:  EDSIL IRIGOYEN is a 82 year old man with dementia, CKD, HTN, CAD, HLD and history of CVA  (2006, 2021), Parkinson's  PAIN:  Are you having pain? No  PRECAUTIONS: Fall  WEIGHT BEARING RESTRICTIONS: No  FALLS: Has patient fallen in last 6 months? No  LIVING ENVIRONMENT: Lives with: lives with their family and lives with their spouse Lives in: House/apartment Stairs:  1 brick high step Has following equipment at home: Environmental consultant - 2 wheeled, Wheelchair (manual), Shower bench, bed side commode, Grab bars, and hospital bed, sliding pad for car  PLOF: Independent with basic ADLs and Independent with household mobility without device  PATIENT GOALS: Walk around  the block with his wife. Be able to use the dining room chair rather than have to use the W/C. Increased I with bed mobility and simple tasks at home like make some coffee or brush his teeth, Step over tub to shower, has bench, but he can't really use it. Up and down the one step to get to back deck.  OBJECTIVE:   DIAGNOSTIC FINDINGS:  MRI 2021 with degenerative changes in lumbar spine, L3-4 R subarticular stenosis  COGNITION: Overall cognitive status: History of cognitive impairments - at baseline   SENSATION: Not tested  COORDINATION: Moderately impaired BLE   MUSCLE LENGTH: Hamstrings: severely restricted B Thomas test: Severely restricted B.  POSTURE: rounded shoulders, forward head, increased thoracic kyphosis, posterior pelvic tilt, and flexed trunk   LOWER EXTREMITY ROM:   BLE extremely stiff throughout, limited hip ROM in all planes, B knees limited in extension.   LOWER EXTREMITY MMT:  3+/5 throughout. Unable to determine accuracy of MMT due to cognition. Functionally demosntrates poor coordinated activation and poor muscular endurance.   BED MOBILITY: Min A for Supine<> Sit. Patient was using a ladder in his hospital bed and could move MI.  TRANSFERS: Min A, mod VC, tends to lean back.  CURB: Min A-Has one step out to his deck.  STAIRS: N/A   GAIT: Gait pattern: step to pattern, decreased arm swing- Right, decreased arm swing- Left, decreased step length- Right, decreased step length- Left, shuffling, festinating, trunk flexed, narrow BOS, poor foot clearance- Right, and poor foot clearance- Left Distance walked: 74' Assistive device utilized: Environmental consultant - 2 wheeled Level of assistance: Occasional min A Comments: mod VC to stay close to walker, take longer steps.  FUNCTIONAL TESTS:  5 times sit to stand: 38.23 Timed up and go (TUG): 47.91  with RW, 07/21/22 TUG 29 seconds without device  TODAY'S TREATMENT:                                                                                                                                DATE:  08/24/22 volleyball standing Standing ball kicks Nustep level 5 x 6 minutes [place] and  then rest and then back in, the first part of the walk was great, good steps, once he got on grass much more difficult then this was tough to recover from Side stepping, back ward walking Fast walking Picking up cones on the mat table having turn turn, twist and reach, then did from the floor and then did reaching on top of the weight tower Yellow tband ankle exercises Green tband HS curls   08/18/22 UBE level 1 x 4 minutes Bike level 1 x 5 minutes Nustep level 4 x 5 minutes Sit to stand from mat table slightly raised a lot of cues Side stepping Forward and backward walking, direction changes Seated elbow touches Supine, trunk rotations, partial sit ups, left leg hip abduction with use of sliding board, feet on ball K2C, trunk rotation, bridges and isometric abs Walking 200 feet with cues  08/16/22 Standing hip marches and hip abduction and extension in Pbars with a lot of cues for the movements Stairs step over step with one hand rail and verbal cues to assure clearing the toe UBE level 3 x 5 minutes Leg press with a lot of help getting on and off 20# a lot of cues needed for bigger motions Side stepping, forward and back ward walking with HHA Gait outside down small slope, down and up curb then back with a rest with CGA and a lot of cues Gait outside the front 200 feet to the car  08/11/22 Side stepping in pbars Marches, hip abduction, extension Fast walking Stairs 4" and 6" Nustep level 5 x 6 minutes Sit to stand multiple times and multiple ways On airex head turns, reaching Seated volleyball, standing volleyball Seated ball kicks Gait [place] and to the car   08/09/22 NuStep L5 x 6 minutes Functional status re-assessed for PN-see goals. Balance  training-standing on Air ex pad in parallel bars, rotation to L, holding therapist's hand only for balance, up to min A.   08/04/22 Met him at the car, helped with the transfer out of the car, then walked outside with cues for big smooth steps [place] Bike level 3 x 6 minutes Nustep Level 5 x 5 minutes Gait outside down and up two curbs CGA a lot of cues for steps and to slow down Sit to stand with a lot of cues for nose over toes Seated volleyball, some standing with knees touching bed In pbars sides stepping, backward walking, marching When leaving walked out and [place] again to the car, much more assist and cues as he was tired and started shuffling Some bed mobility in a single room without distractions  08/02/22 NuStep L5 x 6 minutes Supine rotational stretch to trunk, knees in hooklying, rotate to L, reach with L arm to R and back x 5, repeat to the R. Roll to L side lie, reach with R arm into rotation forward and back, tried on L, but his L hip hurt in side lie. Seated trunk rotation to R, reach with L hand, sliding out on mat and back x 5, repeat to R. Seated forward flexion over physioball 5 x 3 sec holds Ambulation with 2# WATE bar held at chest level, emphasizing long steps, U UE support. 70' B side stepping in parallel bars, on airex plank with BUE support, 3 x each direction. Walking in figure 8 around 2 cones on the floor, facing 1 direction at all times to ensure changing directions, CGA, max VC, mildly unsteady. Alternating  step taps on 2, 6" steps for balance.  07/28/22 NuStep L3 x 6 minutes. Stand with back to corner. Use BUE to lift red physioball and rotate to R to touch target on wall, return to cneter, then repeat to L. Walk with ball, reaching straight up to touch overhead target without LOB, CGA for both. Side to side step over 2" obstacle on the floor. He was able to move side to side, but was not able to follow the commands for safe  performance. Step over air ex planks on the floor, using R, then L legs, BUE support on parallel bars. Stable Climbing up and down steps using step over step technique. Required cues to move L hand along with his feet. 2# weight in BUE, reach down to knee height to touch mat, turn to R and up to touch target on wall. 10 x in each direction. Good reaching above shoulder height, needed cues to engage L hand.  07/26/22 Nustep Level 5 x 6 minutes Sit to stand with various heights x 5 each a lot of verbal cues needed and at times some assist Volley ball sitting and standing, some ball kicking Gait without device 3x 150 feet with cues for step length and speed also distractions In Pbars side steps, big steps and backward steps In pbars head and trunk rotations Sitting and standing reaching for cones iwht each hand Supine rolling to sides, feet on ball trunk rotation, bridges Bridges without ball Sitting trunk elongations with reaching and twisting Holding onto rail 12 inch toe touches  07/21/22 Nustep level 5 x 6 minutes TUG 29 seconds without device SBA Volleyball sitting, standing with legs touching table and then without Partial sit ups 6" toe touches Cone touches working on weight shifts, body turns and reaching for balance 4" step ups, close CGA and at times mod A due to LOB, needed a lot of redirecting to stay on task as he is easily distracted Ball kicks with back of knees touching the table Side stepping Walking without device.  [place] and to his car with wife following with w/c  07/19/22:   Nustep L5 5 min, for tissue perfusion, endurance, coordination At steps in smaller room:  Standing for toe raises 10x Tandem standing forward foot on airex, with trunk rotation to each side, to focus on visual cue( waved to his wife) attempted to perform without UE support  Full turn R and L , x 2, with patient cued to place himself in front of mirror-mirror used for feedback to  improve spatial awareness In ll bars  forward steps over airex balance beam  4 lengths, with 2 airex balance beams , cues to increase step length particularly on L Standing lateral step overs airex balance beam cues to place both feet over, patient tends to avoid placing second foot down  Seated for forward rolls with 65 cm therapeutic ball , with partial standing, 10 reps  Seated high marching with alternate arm punches, one 30 sec bout, patinet unable to really complete effectively Standing at steps with 21/2# cuff weight on each leg, for hip flex/ext and hip abd, 10 x each Gait without device 2 separate sets, 100'. Verbal cues to elongate step length B PATIENT EDUCATION: Education details: POC Person educated: Patient and Spouse Education method: Explanation Education comprehension: verbalized understanding  HOME EXERCISE PROGRAM: TBD  GOALS: Goals reviewed with patient? Yes  SHORT TERM GOALS: Target date: 07/19/22  I with initial HEP Baseline: Goal status:  ongoing  2. Decrease 5 x STS to < 25 sec to demonstrate improved functional strength  Baseline: 38 sec  Goal status: 08/09/22 14 sec-met  3. Decrease TUG to < 35 sec to demonstrate improved functional balance  Baseline: 47 sec  Goal status: met 07/21/22  4. Perform sit <> stand transfers from average chair height with S and VC to be able to use the swivel chair at his dining table.  Baseline: Min A  Goal status: 08/09/22-Inconsistently performs  with S from average height. At times requires CGA to cue forward weight shift. ongoing  LONG TERM GOALS: Target date: 09/29/22  Patient will be able to step over his tub at home using grab bars for stability with CGA to access his shower. Baseline: Unable- has bench, but wife reports he steps over then sits. Goal status: ongoing 08/09/22  2.  Decrease 5 x STS to < 18 sec to demonstrate improved functional strength Baseline:  Goal status: 15-2/26/24-met  3.  Decrease TUG to < 25  sec to demonstrate improved functional balance Baseline:  Goal status: 08/09/22  19 sec-met  4.  Patient will perform his bed mobility with MI. Baseline: Min A Goal status: 08/09/22- I as demo on mat-met  5.  Patient will ambulate at least 400' with LRAD, CGA, demonstrate improved upright posture, longer step length. Baseline:  Goal status: 08/09/22-Amb 400', but began to fatigue after about 300' with more flexed posture, L foot shuffling more, unable to maintain safe technique. ongoing  6.  Patient will manage up and down a single step with LRAD, CGA to access his deck at home. Baseline: Unable Goal status: 08/09/22-Up and down 1 step with U rail. ongoing  ASSESSMENT:  CLINICAL IMPRESSION: Today was one of his better days, the gym did not have many people in it and he had less distractions, he walked better outside, he seemed to do better with the cues slow and controlled with his walking OBJECTIVE IMPAIRMENTS: Abnormal gait, decreased activity tolerance, decreased balance, decreased cognition, decreased coordination, decreased endurance, decreased mobility, difficulty walking, decreased ROM, decreased strength, decreased safety awareness, impaired flexibility, impaired UE functional use, improper body mechanics, and postural dysfunction.   ACTIVITY LIMITATIONS: carrying, lifting, bending, sitting, standing, squatting, stairs, transfers, bed mobility, bathing, toileting, dressing, hygiene/grooming, and locomotion level  PARTICIPATION LIMITATIONS: meal prep, cleaning, laundry, interpersonal relationship, shopping, and community activity  PERSONAL FACTORS: Age, Past/current experiences, and 3+ comorbidities: CVA, Parkinson's traits, dementia  are also affecting patient's functional outcome.   REHAB POTENTIAL: Good  CLINICAL DECISION MAKING: Evolving/moderate complexity  EVALUATION COMPLEXITY: Moderate  PLAN:  PT FREQUENCY: 2x/week  PT DURATION: 12 weeks  PLANNED INTERVENTIONS:  Therapeutic exercises, Therapeutic activity, Neuromuscular re-education, Balance training, Gait training, Patient/Family education, Self Care, Joint mobilization, Stair training, Moist heat, and Manual therapy  PLAN FOR NEXT SESSION: Trunk and extremity mobilization, balance, strength, functional mobility re-education.  Lum Babe, PT 08/24/22 2:43 PM

## 2022-08-24 NOTE — Addendum Note (Signed)
Addended by: Theone Murdoch B on: 08/24/2022 03:25 PM   Modules accepted: Orders

## 2022-08-25 ENCOUNTER — Ambulatory Visit: Payer: PPO | Admitting: Occupational Therapy

## 2022-08-25 DIAGNOSIS — I69318 Other symptoms and signs involving cognitive functions following cerebral infarction: Secondary | ICD-10-CM

## 2022-08-25 DIAGNOSIS — R41840 Attention and concentration deficit: Secondary | ICD-10-CM

## 2022-08-25 DIAGNOSIS — R278 Other lack of coordination: Secondary | ICD-10-CM

## 2022-08-25 DIAGNOSIS — M6281 Muscle weakness (generalized): Secondary | ICD-10-CM

## 2022-08-25 DIAGNOSIS — I69354 Hemiplegia and hemiparesis following cerebral infarction affecting left non-dominant side: Secondary | ICD-10-CM

## 2022-08-25 NOTE — Therapy (Signed)
OUTPATIENT OCCUPATIONAL THERAPY TREATMENT NOTE  Patient Name: VI KANAN MRN: [id number] DOB:17-Dec-1940, 82 y.o., male Today's Date: 08/25/2022  PCP & REFERRING PROVIDER:  Carollee Herter, Alferd Apa, DO    END OF SESSION:  OT End of Session - 08/25/22 1550     Visit Number 9    Number of Visits 32    Date for OT Re-Evaluation 11/16/22    Authorization Type [employer]    Authorization - Visit Number 9    Progress Note Due on Visit 10    OT Start Time 1447    OT Stop Time 1530    OT Time Calculation (min) 43 min                  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   HTN (hypertension) 07/19/2015   Hyperlipidemia    Major neurocognitive disorder 01/09/2014   Mild, related to stroke history   Nocturia    Renal insufficiency 06/25/2013   S/P carotid endarterectomy    BILATERAL ICA--  PATENT PER DUPLEX  05-19-2012   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   Right calf   Urinary frequency    Vitamin D deficiency    Past Surgical History:  Procedure Laterality Date   APPENDECTOMY  AS CHILD   CARDIOVASCULAR STRESS TEST  03-27-2012  DR CRENSHAW   LOW RISK LEXISCAN STUDY-- PROBABLE NORMAL PERFUSION AND SOFT TISSUE ATTENUATION/  NO ISCHEMIA/ EF 51%   CAROTID ENDARTERECTOMY Bilateral LEFT  11-12-2008  DR GREG HAYES   RIGHT ICA  2006  ([facility])   CHOLECYSTECTOMY N/A 02/23/2022   Procedure: LAPAROSCOPIC CHOLECYSTECTOMY;  Surgeon: Felicie Morn, MD;  Location: [place];  Service: General;  Laterality: N/A;   CYSTOSCOPY W/ RETROGRADES  Bilateral 06/22/2021   Procedure: CYSTOSCOPY WITH RETROGRADE PYELOGRAM;  Surgeon: Franchot Gallo, MD;  Location: [place];  Service: Urology;  Laterality: Bilateral;   CYSTOSCOPY WITH LITHOLAPAXY N/A 02/26/2013   Procedure: CYSTOSCOPY WITH LITHOLAPAXY;  Surgeon: Franchot Gallo, MD;  Location[facility];  Service: Urology;  Laterality: N/A;   ENDOSCOPIC RETROGRADE CHOLANGIOPANCREATOGRAPHY (ERCP) WITH PROPOFOL N/A 02/22/2022   Procedure: ENDOSCOPIC RETROGRADE CHOLANGIOPANCREATOGRAPHY (ERCP) WITH PROPOFOL;  Surgeon: Carol Ada, MD;  Location: [place];  Service: Gastroenterology;  Laterality: N/A;   EYE SURGERY  Jan. 2016   cataract surgery both eyes   INGUINAL HERNIA REPAIR Right 11-08-2006   IR KYPHO EA ADDL LEVEL THORACIC OR LUMBAR  02/12/2021   IR RADIOLOGIST EVAL & MGMT  02/18/2021   MASS EXCISION N/A 03/03/2016   Procedure: EXCISION OF BACK  MASS;  Surgeon: Stark Klein, MD;  Location: [place];  Service: General;  Laterality: N/A;   MOHS SURGERY Left 1/ 2016   Dr Nevada Crane-- Basal cell   PROSTATE SURGERY     REMOVAL OF STONES  02/22/2022   Procedure: REMOVAL OF STONES;  Surgeon: Carol Ada, MD;  Location: [place];  Service: Gastroenterology;;   [name]  02/22/2022  Procedure: SPHINCTEROTOMY;  Surgeon: Carol Ada, MD;  Location: [facility] ENDOSCOPY;  Service: Gastroenterology;;   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-C N/A 06/22/2021   Procedure: TRANSURETHRAL RESECTION OF BLADDER TUMOR;  Surgeon: Franchot Gallo, MD;  Location: [facility];  Service: Urology;  Laterality: N/A;   TRANSURETHRAL RESECTION OF PROSTATE N/A 02/26/2013   Procedure: TRANSURETHRAL RESECTION OF THE PROSTATE WITH GYRUS INSTRUMENTS;  Surgeon: Franchot Gallo, MD;  Location: [facility];  Service: Urology;  Laterality: N/A;   TRANSURETHRAL RESECTION OF PROSTATE N/A 06/22/2021   Procedure: TRANSURETHRAL  RESECTION OF THE PROSTATE (TURP);  Surgeon: Franchot Gallo, MD;  Location: [facility];  Service: Urology;  Laterality: N/A;   Patient Active Problem List   Diagnosis Date Noted   Dysuria 08/03/2022   Nonintractable headache 07/01/2022   Bilateral impacted cerumen 06/24/2022   Rash 06/15/2022   Postoperative ileus ([place]) 02/27/2022   Ileus, postoperative ([place]) 02/26/2022   Choledocholithiasis 02/19/2022   DNR (do not resuscitate) 02/19/2022   Left elbow pain 01/26/2022   Mid back pain on left side 01/26/2022   Rib pain 01/26/2022   Acute pain of left shoulder 11/12/2021   Leukocytes in urine 11/12/2021   Urinary frequency 11/12/2021   Thrush 10/08/2021   Hemiplegia, dominant side S/P CVA (cerebrovascular accident) ([place]) 09/11/2021   Insomnia    Prediabetes    Acute 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 ([place])    Basal ganglia infarction ([place]) 07/29/2021   Transaminitis 07/27/2021   UTI (urinary tract infection) 07/27/2021   CVA (cerebral vascular accident) ([place]) 07/27/2021   Fall 07/27/2021   Hyperglycemia 07/27/2021   Cholelithiasis 07/27/2021   Hypoxia 07/27/2021   Nausea and vomiting [id number]   Acute metabolic encephalopathy [id number]   Normocytic anemia 07/27/2021   Chronic back pain 07/27/2021   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[place]) 06/22/2021   Closed fracture of first lumbar vertebra with routine healing 02/03/2021   Closed fracture of multiple ribs 11/18/2020   Anxiety 01/29/2020   Leg pain, bilateral 01/29/2020   Ingrown toenail 07/13/2019   Lumbar spondylosis 05/02/2018   Pain in left knee 03/09/2018   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   Trochanteric bursitis of left hip 01/16/2018   Preventative health care 09/26/2017   HTN (hypertension) 07/19/2015   Hyperlipidemia 07/19/2015   Great toe pain 02/11/2014   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9 (ICD-10-CM) - Cerebrovascular accident (CVA), unspecified mechanism ([place])  W19.XXXD (ICD-10-CM) - Fall, subsequent encounter    THERAPY DIAG:  Muscle weakness (generalized)  Hemiplegia and hemiparesis following cerebral infarction affecting left non-dominant side (HCC)  Other lack of coordination  Attention and concentration deficit  Other symptoms and signs involving cognitive functions following cerebral infarction  Rationale for Evaluation and Treatment: Rehabilitation  PERTINENT HISTORY: PMHx includes L basal ganglia and corona radiata infarct 07/27/21, L lower homonymous  quadrantanopia; R MCA infarct in 2006 w/ L-sided hemiparesis, NCD, carotid artery disease (bilateral), HTN, HLD,CKD3, vascular dementia, and hx of bladder cancer. He has had falls in 2023, fracturing his clavicle in August, then he had stomach surgery in Sept 2023. He has He needs assist at baseline for ADLs. When last documented in acute rehab after having his gall-bladder removed, (03/09/22) he required moderate to maximum assist x2 for most ADLs (more assist for ADLs the require standing), and they recommended rehab in skilled nursing setting. He apparently got Covid at some point and also attended a SNF. Once he D/C home, he had some episodes of combativeness at night  with "sun-downing."  He is communicative and appropriate, though appears easily confused  and distracted, not fully oriented to time and place (Ax2, states date is 53 and cannot say what city he lives in). His wife provides most details, and he is participative, though a poor historian at times. In a nut shell- he has been suffering from dementia and post-stroke effects for years, and his issues were recently exacerbated in Sept of last year after abdominal surgery. He has less use of Lt "stroke side" and new, ritualistic "ticks" in mouth and Rt hand/arm.  Wife states that they do have a caregiver come M, W, F for 4 hours in the middle of the day to mainly help him bathe and dress, which usually tires him out. He has been more combative towards the end of the day as well.   PRECAUTIONS: Fall and Other: Lt hemi, poor cognition, LATEX and adhesive allergies, etc.  ;WEIGHT BEARING RESTRICTIONS: No   SUBJECTIVE:   SUBJECTIVE STATEMENT: Pt reports difficulty opening newspaper  Pt accompanied by:  his wife  PAIN:  Are you having pain? Not now at rest Rating: 0/10 at rest now   FALLS: Has patient fallen in last 6 months? Yes. Number of falls at least 1 resulting in clavicle fx (now healed)   LIVING ENVIRONMENT: Lives with: lives with their spouse Lives in: House/apartment Has following equipment at home: manual w/c, 2 w/w, weighted utensils, shower bench (for tub), bed cane, hospital bed, raised toilet, and possibly more  PLOF: Needs assistance with ADLs, Needs assistance with homemaking, Needs assistance with gait, and Needs assistance with transfers  PATIENT GOALS: at eval- Top caregiver goals today:  #1- increase personal hygiene skills (brush teeth, shaving) #2- prepare instant coffee or microwave meal #3- improve functional cognitive skills to play games, etc.,  #4- increase Lt arm usage #5- try to help decrease Rt hand/arm perseveration (flicking, tapping, noise making) #6- Improve fnl tranfers in/out of multiple seats (tub bench, swivel chair, etc.)    OBJECTIVE: (All  objective assessments below are from initial evaluation on: 07/07/22 unless otherwise specified.)   HAND DOMINANCE: Right  ADLs: Overall ADLs: Wife states decreased ability since hospitalization in Sept 23.  Transfers/ambulation related to ADLs: Min A transfer, CGA for mobility Eating: set up Grooming: Mod A- needs assist with oral care, shaving, but can comb his hair UB Dressing: Min A  LB Dressing: Dep  Toileting:  Mod  Bathing: Mod  Tub Shower transfers: Min A  Equipment: Radio broadcast assistant, Grab bars, and Feeding equipment  IADLs: completely dependent for years now, except some ability to use microwave which is now not possible after hospitalization   MOBILITY STATUS: Needs Assist: CGA - Min A, Hx of falls, Festination, difficulty with turns, and neglect on Lt side  POSTURE COMMENTS:  rounded shoulders, forward head,  and weight shift left  ACTIVITY TOLERANCE: Activity tolerance: he became tired and somewhat agitated by the end of the OT evaluation today (after PT and OT about 2.5hours with mostly sedate activities in seated positions)  FUNCTIONAL OUTCOME MEASURES: [facility] 17/30  showing problems with visuospatial/executive function, attention, fluency, and orientation. (at least 26/30 is needed to be considered "normal")  [employer] in ADLs: 1/6  (very dependent)  UPPER EXTREMITY ROM:    Active ROM Right eval Left eval  Shoulder scaption 100 85  Shoulder abduction    Shoulder adduction    Shoulder extension    Shoulder internal rotation Reaches small of back Reaches to hip  Shoulder external rotation Reaches back of head  Reaches up to ear  Elbow flexion 150 130  Elbow extension (-10*)  (-35*)  Wrist flexion 35 20  Wrist extension 50 10  (Blank rows = not tested)  UPPER EXTREMITY MMT:    Tested grossly as "push" and "pull": UE push 4-/5, UE pull 4/5  HAND FUNCTION: Grip strength: Right: 30 lbs; Left: 20 lbs  COORDINATION: Finger Nose Finger test: mildly  ataxic but fairly accurate  Box and Blocks:  Right 11blocks, Left 5blocks  MUSCLE TONE:  Rt- normal, Lt- decreased and worsened by inattention/disuse  COGNITION: Overall cognitive status: Impaired and Difficulty to assess due to: severity of deficits; Poor attention, fluency, orientation, and executive function. Recall is fairly good as well as abstraction.  Seems to have personality changes due to vascular dementia- like an OCD type volitional perseveration of tapping with Rt hand that he can somewhat control.  He did become angry and agitated at the end of the session performing car transfer with spouse: he did shout at her.  VISION: Subjective report: no new changes per pt/spouse, uses "cheaters"   VISION ASSESSMENT: Visual fields seem intact though he had latent responses on the left side showing likely inattention/and/neglect, tracking somewhat delayed horizontally and vertically  PERCEPTION: Impaired: Inattention/neglect: does not attend to left visual field and does not attend to left side of body, Body scheme: poor quality of recognition with shaving, etc., and Spatial orientation: decreased in print and in orientation, especially to Lt side  PRAXIS: Not tested specifically but no overt/stated issues using pen, wearing glasses ,etc.   OBSERVATIONS: Pt taps and flicks and does seem to volitionally perseverate on making a rhythm/noise with Rt hand. He states he is aware of it and chooses to do it.  He does also seem to have mild resting and intention tremor.    TODAY'S TREATMENT:     DATE: 08/25/22- Therapist assisted pt with scooting up to the table, pt's RUE was pinched between table and chair. Pt with small bruise at R thumb IP knuckle. Pt's wife aware. Pt was in no distress after initial pinch.   Pt's wife brought in newspaper, pt practiced flipping pages on tabletop, mod v.c and facilitation. Pt located the sports section with min-mod questioning cues. Therapist added cognitive  component by having pt locate certain items in the paper with mod v.c.  Tossing and catching ball with bilateral UE's while seated on the mat, mod v.c for LUE use. Therapist added cognitive component by having pt answer questions or name an item for a specific letter of the alphabet.  Placing/ removing wooden dowel pegs in pegboard with right and left UE's to follow specific verbal instuctions, mod v.c and min-mod difficulty with LUE use. Pt continues to neglect LUE.  Pt left in no distress, stating  it was a good session.   08/24/22 Therapist checked progress towards goals today in preparation for renewal. Pt's wife reports pt has not progressed as quickly as in the past and she would like to focus more on functional activities.  Therapist discussed and developed updated goals with patient and wife. See goals for progress and updates.  Pt's wife brought in several containers and pt practiced opening and closing with min-supervision and min v.c Standing at countertop pt opened coffee container and scooped coffee into a cup with supervision/ minguard for balance and min-mod v.c  Donning and doffing flannel shirt several repetitions with mod A initially then improved to min A and mod v.c  Ambulating to and from chair with practice backing up, slowing down and reaching back with bilateral UE's to encourage pt to sit more squarely in seat, mod difficulty/ mod v.c and pt still was not completely even in seat but improved.        PATIENT EDUCATION: Education details: See treatment section above for details, progress towards goals and updated goals. Person educated: Patient and Spouse Education method: Explanation, Demonstration, Tactile cues, and Verbal cues Education comprehension: verbalized understanding, returned demonstration, verbal cues required, tactile cues required, and needs further education  HOME EXERCISE PROGRAM: Access Code: [email or web address] URL:  [email or web address] Date: 07/21/2022 Prepared by: Benito Mccreedy Exercises - Seated Shoulder Flexion AAROM with Dowel  - 4-6 x daily - 1 sets - 10-15 reps - Seated Shoulder Abduction AAROM with Dowel  - 4-6 x daily - 1 sets - 10-15 reps - Seated Shoulder External Rotation AAROM with Dowel  - 4-6 x daily - 1 sets - 10-15 reps - Seated Bicep Curls with Bar  - 4-6 x daily - 1 sets - 10-15 reps - Seated Shoulder Row with Anchored Resistance  - 4-6 x daily - 1 sets - 10-15 reps     GOALS: Goals reviewed with patient? Yes   SHORT TERM GOALS: (STG required if POC>30 days) Target Date: 09/23/22   Pt will demo/state understanding of initial HEP (or home activity suggestions to maintain function as appropriate) to improve functional skills. Goal status: 07/28/22- Met (addressed in past 2 sessions) 2. Pt will donn a t-shirt consistently with min A and v.c   Goal status: New 3. Pt will demonstrate ability to open the newspaper    with supervision min v.c Goal status: New 4.  Pt will consistently sustain focus to a simple functional task  in a minimally distracting environment x 6 mins with no more than 1 re-direct(ie: eating, functional task in clinic) Baseline:  Goal status: NEW 5. Pt will consistently open containers with only supervision and min v.c           Goal status:New    LONG TERM GOALS: Target Date: 11/16/22  Pt will improve functional ability by decreased impairment per Redmond Baseman assessment from 1/6 to 2/6 or better, for better quality of life. Goal status:  not met 1/6  2.  Caregiver will state his grooming ability/hygiene ability improved from moderate assist to supervision assist. Goal status: not met-Pt continues to require mod A.  3.  He will show better functional use of left arm by improved box and blocks Test score from 5 blocks to at least 10 blocks. Goal status:not met but improved 8 blocks  4.  Pt will show ability to microwave instant  coffee or small meal safely, to help decrease caregiver burden. Goal status: partially met, pt is able to microwave coffee  with supervision, however occasional min A to open containers 5.  He will show improved cognitive ability enough to play a simple game with OT and caregiver. Goal status: approximating goal, pt has played a game of uno at home with family.  6.  Caregiver will state at least 4 ways to decrease perseveration with right arm, also stating that this behaviors at least somewhat better in the home now. Goal status: deferred perseverative behaviors have improved.  7. Pt will donn a flannel button front shirt consistently with min A and min v.c Baseline: mod-max Goal status: NEW 8.  Pt will use his LUE as an active assist for ADLS grossly [id number] of the time with min v.c Baseline: uses 25% Goal status: NEW   9 Pt will stand at the countertop and perform functional activities  incorporating bilateral UE's with supervision and min v.c  Goal status: NEW 10. Pt will back up to a chair and sit down squarely at least 1/3 of the time with min v.c  Goal status: new 11..  Pt will open the newspaper and locate sports section with no more than min v.c                          Goal status: NEW  ASSESSMENT:  CLINICAL IMPRESSION: Pt with good movement and participation in today's session with improved ability to attend to newspaper task for at least 3-5 at a time. OT Frequency/DURATION: 2x week x 12 weeks however anticipate d/c after 8 weeks dependent on progress  PLANNED INTERVENTIONS: self care/ADL training, therapeutic exercise, therapeutic activity, neuromuscular re-education, manual therapy, balance training, functional mobility training, moist heat, cryotherapy, patient/family education, cognitive remediation/compensation, visual/perceptual remediation/compensation, psychosocial skills training, energy conservation, coping strategies training, DME and/or AE instructions, and  Re-evaluation  RECOMMENDED OTHER SERVICES: He has already seen PT for balance and functional mobility, he would probably benefit from speech for cognitive issues as well, though OT will be working with cognition somewhat functionally now.  CONSULTED AND AGREED WITH PLAN OF CARE: Patient and family member/caregiver  PLAN FOR NEXT SESSION:  Reinforce donning shirt, functional activities with bilateral UE's,   Theone Murdoch, OTR/L 3:51 PM 08/25/22

## 2022-08-26 ENCOUNTER — Encounter: Payer: Self-pay | Admitting: Physical Therapy

## 2022-08-26 ENCOUNTER — Ambulatory Visit: Payer: PPO | Admitting: Physical Therapy

## 2022-08-26 DIAGNOSIS — I69354 Hemiplegia and hemiparesis following cerebral infarction affecting left non-dominant side: Secondary | ICD-10-CM

## 2022-08-26 DIAGNOSIS — M6281 Muscle weakness (generalized): Secondary | ICD-10-CM

## 2022-08-26 DIAGNOSIS — R278 Other lack of coordination: Secondary | ICD-10-CM | POA: Diagnosis not present

## 2022-08-26 DIAGNOSIS — Z9181 History of falling: Secondary | ICD-10-CM

## 2022-08-26 DIAGNOSIS — R296 Repeated falls: Secondary | ICD-10-CM

## 2022-08-26 DIAGNOSIS — R262 Difficulty in walking, not elsewhere classified: Secondary | ICD-10-CM

## 2022-08-26 NOTE — Therapy (Signed)
OUTPATIENT PHYSICAL THERAPY NEURO Treatment   Patient Name: William [name] MRN: [id number] DOB:11/22/1940, 82 y.o., male Today's Date: 08/26/2022  PCP: Lowne Chase, [place] PROVIDER: same  END OF SESSION:  PT End of Session - 08/26/22 1655     Visit Number 15    Date for PT Re-Evaluation 09/29/22    PT Start Time 1355    PT Stop Time 1440    PT Time Calculation (min) 45 min    Activity Tolerance Patient tolerated treatment well    Behavior During Therapy Impulsive   distracted            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   HTN (hypertension) 07/19/2015   Hyperlipidemia    Major neurocognitive disorder 01/09/2014   Mild, related to stroke history   Nocturia    Renal insufficiency 06/25/2013   S/P carotid endarterectomy    BILATERAL ICA--  PATENT PER DUPLEX  05-19-2012   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/26/2017   Right calf   Urinary frequency    Vitamin D deficiency    Past Surgical History:  Procedure Laterality Date   APPENDECTOMY  AS CHILD   CARDIOVASCULAR STRESS TEST  03-27-2012  DR CRENSHAW   LOW RISK LEXISCAN STUDY-- PROBABLE NORMAL PERFUSION AND SOFT TISSUE ATTENUATION/  NO ISCHEMIA/ EF 51%   CAROTID ENDARTERECTOMY Bilateral LEFT  11-12-2008  DR GREG HAYES   RIGHT ICA  2006  ([facility])   CHOLECYSTECTOMY N/A 02/23/2022   Procedure: LAPAROSCOPIC CHOLECYSTECTOMY;  Surgeon: Felicie Morn, MD;  Location: [place];  Service: General;  Laterality: N/A;   CYSTOSCOPY W/ RETROGRADES Bilateral 06/22/2021   Procedure: CYSTOSCOPY  WITH RETROGRADE PYELOGRAM;  Surgeon: Franchot Gallo, MD;  Location: [facility] [place];  Service: Urology;  Laterality: Bilateral;   CYSTOSCOPY WITH LITHOLAPAXY N/A 02/26/2013   Procedure: CYSTOSCOPY WITH LITHOLAPAXY;  Surgeon: Franchot Gallo, MD;  Location: [facility];  Service: Urology;  Laterality: N/A;   ENDOSCOPIC RETROGRADE CHOLANGIOPANCREATOGRAPHY (ERCP) WITH PROPOFOL N/A 02/22/2022   Procedure: ENDOSCOPIC RETROGRADE CHOLANGIOPANCREATOGRAPHY (ERCP) WITH PROPOFOL;  Surgeon: Carol Ada, MD;  Location: [place];  Service: Gastroenterology;  Laterality: N/A;   EYE SURGERY  Jan. 2016   cataract surgery both eyes   INGUINAL HERNIA REPAIR Right 11-08-2006   IR KYPHO EA ADDL LEVEL THORACIC OR LUMBAR  02/12/2021   IR RADIOLOGIST EVAL & MGMT  02/18/2021   MASS EXCISION N/A 03/03/2016   Procedure: EXCISION OF BACK  MASS;  Surgeon: Stark Klein, MD;  Location: [place];  Service: General;  Laterality: N/A;   MOHS SURGERY Left 1/ 2016   Dr Nevada Crane-- Basal cell   PROSTATE SURGERY     REMOVAL OF STONES  02/22/2022   Procedure: REMOVAL OF STONES;  Surgeon: Carol Ada, MD;  Location: [place];  Service: Gastroenterology;;   [name]  02/22/2022   Procedure: [name];  Surgeon: Carol Ada, MD;  Location: [place];  Service: Gastroenterology;;   TRANSURETHRAL RESECTION OF  BLADDER TUMOR WITH MITOMYCIN-C N/A 06/22/2021   Procedure: TRANSURETHRAL RESECTION OF BLADDER TUMOR;  Surgeon: Franchot Gallo, MD;  Location: [facility];  Service: Urology;  Laterality: N/A;   TRANSURETHRAL RESECTION OF PROSTATE N/A 02/26/2013   Procedure: TRANSURETHRAL RESECTION OF THE PROSTATE WITH GYRUS INSTRUMENTS;  Surgeon: Franchot Gallo, MD;  Location: [name];  Service: Urology;  Laterality: N/A;   TRANSURETHRAL RESECTION OF PROSTATE N/A 06/22/2021   Procedure: TRANSURETHRAL RESECTION OF THE PROSTATE (TURP);  Surgeon:  Franchot Gallo, MD;  Location: [facility];  Service: Urology;  Laterality: N/A;   Patient Active Problem List   Diagnosis Date Noted   Dysuria 08/03/2022   Nonintractable headache 07/01/2022   Bilateral impacted cerumen 06/24/2022   Rash 06/15/2022   Postoperative ileus ([place]) 02/27/2022   Ileus, postoperative ([place]) 02/26/2022   Choledocholithiasis 02/19/2022   DNR (do not resuscitate) 02/19/2022   Left elbow pain 01/26/2022   Mid back pain on left side 01/26/2022   Rib pain 01/26/2022   Acute pain of left shoulder 11/12/2021   Leukocytes in urine 11/12/2021   Urinary frequency 11/12/2021   Thrush 10/08/2021   Hemiplegia, dominant side S/P CVA (cerebrovascular accident) ([place]) 09/11/2021   Insomnia    Prediabetes    Acute 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 ([place])    Basal ganglia infarction ([place]) 07/29/2021   Transaminitis 07/27/2021   UTI (urinary tract infection) 07/27/2021   CVA (cerebral vascular accident) ([place]) 07/27/2021   Fall 07/27/2021   Hyperglycemia 07/27/2021   Cholelithiasis 07/27/2021   Hypoxia 07/27/2021   Nausea and vomiting [id number]   Acute metabolic encephalopathy [id number]   Normocytic anemia 07/27/2021   Chronic back pain 07/27/2021   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[place]) 06/22/2021   Closed fracture of first lumbar vertebra with routine healing 02/03/2021   Closed fracture of multiple ribs 11/18/2020   Anxiety 01/29/2020   Leg pain, bilateral 01/29/2020   Ingrown toenail 07/13/2019   Lumbar spondylosis 05/02/2018   Pain in left knee 03/09/2018   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   Trochanteric bursitis of left hip 01/16/2018   Preventative health care 09/26/2017   HTN (hypertension) 07/19/2015   Hyperlipidemia 07/19/2015   Great toe pain 02/11/2014   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   Obesity (BMI 30-39.9) 06/25/2013   Renal insufficiency 06/25/2013    Weakness of left arm 06/25/2013   Sebaceous cyst 03/03/2011   Sprain of lumbar region 07/31/2010   Rib pain, left 08/29/2009   Carotid artery stenosis, asymptomatic, bilateral 05/02/2009   Eczema, atopic 05/31/2008   Vitamin D deficiency 03/01/2008   BPH (benign prostatic hyperplasia) 08/06/2007   Fasting hyperglycemia 12/21/2006   History of right MCA infarct 2006    ONSET DATE: 06/29/22  REFERRING DIAG:  Diagnosis  I63.9 (ICD-10-CM) - Cerebrovascular accident (CVA), unspecified mechanism ([place])  W19.XXXD (ICD-10-CM) - Fall, subsequent encounter    THERAPY DIAG:  Muscle weakness (generalized)  Hemiplegia and hemiparesis following cerebral infarction affecting left non-dominant side (HCC)  Other lack of coordination  History of falling  Difficulty in walking, not elsewhere classified  Repeated falls  Rationale for Evaluation and Treatment: Rehabilitation  SUBJECTIVE:  SUBJECTIVE STATEMENT: No falls, no stumbles PERTINENT HISTORY:  JUSTICE SAWAYA is a 82 year old man with dementia, CKD, HTN, CAD, HLD and history of CVA  (2006, 2021), Parkinson's  PAIN:  Are you having pain? No  PRECAUTIONS: Fall  WEIGHT BEARING RESTRICTIONS: No  FALLS: Has patient fallen in last 6 months? No  LIVING ENVIRONMENT: Lives with: lives with their family and lives with their spouse Lives in: House/apartment Stairs:  1 brick high step Has following equipment at home: Environmental consultant - 2 wheeled, Wheelchair (manual), Shower bench, bed side commode, Grab bars, and hospital bed, sliding pad for car  PLOF: Independent with basic ADLs and Independent with household mobility without device  PATIENT GOALS: Walk around the block with his wife. Be able to use the dining room chair rather than have to use the W/C.  Increased I with bed mobility and simple tasks at home like make some coffee or brush his teeth, Step over tub to shower, has bench, but he can't really use it. Up and down the one step to get to back deck.  OBJECTIVE:   DIAGNOSTIC FINDINGS:  MRI 2021 with degenerative changes in lumbar spine, L3-4 R subarticular stenosis  COGNITION: Overall cognitive status: History of cognitive impairments - at baseline   SENSATION: Not tested  COORDINATION: Moderately impaired BLE   MUSCLE LENGTH: Hamstrings: severely restricted B Thomas test: Severely restricted B.  POSTURE: rounded shoulders, forward head, increased thoracic kyphosis, posterior pelvic tilt, and flexed trunk   LOWER EXTREMITY ROM:   BLE extremely stiff throughout, limited hip ROM in all planes, B knees limited in extension.   LOWER EXTREMITY MMT:  3+/5 throughout. Unable to determine accuracy of MMT due to cognition. Functionally demosntrates poor coordinated activation and poor muscular endurance.   BED MOBILITY: Min A for Supine<> Sit. Patient was using a ladder in his hospital bed and could move MI.  TRANSFERS: Min A, mod VC, tends to lean back.  CURB: Min A-Has one step out to his deck.  STAIRS: N/A   GAIT: Gait pattern: step to pattern, decreased arm swing- Right, decreased arm swing- Left, decreased step length- Right, decreased step length- Left, shuffling, festinating, trunk flexed, narrow BOS, poor foot clearance- Right, and poor foot clearance- Left Distance walked: 39' Assistive device utilized: Environmental consultant - 2 wheeled Level of assistance: Occasional min A Comments: mod VC to stay close to walker, take longer steps.  FUNCTIONAL TESTS:  5 times sit to stand: 38.23 Timed up and go (TUG): 47.91  with RW, 07/21/22 TUG 29 seconds without device  TODAY'S TREATMENT:                                                                                                                               DATE:  08/26/22 I met him  at the car and walked in the grass and did some curb negotiating and more walking. Seated volley ball 5# straight arm pulls on  and off airex Leg press 20# 2x10 Side stepping, fwd and backward walking 2# LAQ 2# marches 2# hip abduction Worked on sitting trunk motions side to side and leaning forward as he was tending to lean to the back today USe of SPC cone toe touches  08/24/22 volleyball standing Standing ball kicks Nustep level 5 x 6 minutes Walk outside partially around [place] and then rest and then back in, the first part of the walk was great, good steps, once he got on grass much more difficult then this was tough to recover from Side stepping, back ward walking Fast walking Picking up cones on the mat table having turn turn, twist and reach, then did from the floor and then did reaching on top of the weight tower Yellow tband ankle exercises Green tband HS curls   08/18/22 UBE level 1 x 4 minutes Bike level 1 x 5 minutes Nustep level 4 x 5 minutes Sit to stand from mat table slightly raised a lot of cues Side stepping Forward and backward walking, direction changes Seated elbow touches Supine, trunk rotations, partial sit ups, left leg hip abduction with use of sliding board, feet on ball K2C, trunk rotation, bridges and isometric abs Walking 200 feet with cues  08/16/22 Standing hip marches and hip abduction and extension in Pbars with a lot of cues for the movements Stairs step over step with one hand rail and verbal cues to assure clearing the toe UBE level 3 x 5 minutes Leg press with a lot of help getting on and off 20# a lot of cues needed for bigger motions Side stepping, forward and back ward walking with HHA Gait outside down small slope, down and up curb then back with a rest with CGA and a lot of cues Gait outside the front 200 feet to the car  08/11/22 Side stepping in pbars Marches, hip abduction, extension Fast walking Stairs 4" and 6" Nustep level  5 x 6 minutes Sit to stand multiple times and multiple ways On airex head turns, reaching Seated volleyball, standing volleyball Seated ball kicks Gait outside around [place] and to the car   08/09/22 NuStep L5 x 6 minutes Functional status re-assessed for PN-see goals. Balance training-standing on Air ex pad in parallel bars, rotation to L, holding therapist's hand only for balance, up to min A.   08/04/22 Met him at the car, helped with the transfer out of the car, then walked outside with cues for big smooth steps [place] Bike level 3 x 6 minutes Nustep Level 5 x 5 minutes Gait outside down and up two curbs CGA a lot of cues for steps and to slow down Sit to stand with a lot of cues for nose over toes Seated volleyball, some standing with knees touching bed In pbars sides stepping, backward walking, marching When leaving walked out and around [place] again to the car, much more assist and cues as he was tired and started shuffling Some bed mobility in a single room without distractions PATIENT EDUCATION: Education details: POC Person educated: Patient and Spouse Education method: Explanation Education comprehension: verbalized understanding  HOME EXERCISE PROGRAM: TBD  GOALS: Goals reviewed with patient? Yes  SHORT TERM GOALS: Target date: 07/19/22  I with initial HEP Baseline: Goal status: ongoing  2. Decrease 5 x STS to < 25 sec to demonstrate improved functional strength  Baseline: 38 sec  Goal status: 08/09/22 14 sec-met  3. Decrease TUG to < 35 sec  to demonstrate improved functional balance  Baseline: 47 sec  Goal status: met 07/21/22  4. Perform sit <> stand transfers from average chair height with S and VC to be able to use the swivel chair at his dining table.  Baseline: Min A  Goal status: 08/09/22-Inconsistently performs  with S from average height. At times requires CGA to cue forward weight shift. ongoing  LONG TERM GOALS: Target date:  09/29/22  Patient will be able to step over his tub at home using grab bars for stability with CGA to access his shower. Baseline: Unable- has bench, but wife reports he steps over then sits. Goal status: ongoing 08/09/22  2.  Decrease 5 x STS to < 18 sec to demonstrate improved functional strength Baseline:  Goal status: 15-2/26/24-met  3.  Decrease TUG to < 25 sec to demonstrate improved functional balance Baseline:  Goal status: 08/09/22  19 sec-met  4.  Patient will perform his bed mobility with MI. Baseline: Min A Goal status: 08/09/22- I as demo on mat-met  5.  Patient will ambulate at least 400' with LRAD, CGA, demonstrate improved upright posture, longer step length. Baseline:  Goal status: 08/09/22-Amb 400', but began to fatigue after about 300' with more flexed posture, L foot shuffling more, unable to maintain safe technique. ongoing  6.  Patient will manage up and down a single step with LRAD, CGA to access his deck at home. Baseline: Unable Goal status: 08/09/22-Up and down 1 step with U rail. ongoing  ASSESSMENT:  CLINICAL IMPRESSION: Patient did well to start with with walking and his following directions, as he became fatigued he became more distracted and then started having some difficulty with sitting posture as he was really leaning back more and needed a lot of cues to correct this. OBJECTIVE IMPAIRMENTS: Abnormal gait, decreased activity tolerance, decreased balance, decreased cognition, decreased coordination, decreased endurance, decreased mobility, difficulty walking, decreased ROM, decreased strength, decreased safety awareness, impaired flexibility, impaired UE functional use, improper body mechanics, and postural dysfunction.   ACTIVITY LIMITATIONS: carrying, lifting, bending, sitting, standing, squatting, stairs, transfers, bed mobility, bathing, toileting, dressing, hygiene/grooming, and locomotion level  PARTICIPATION LIMITATIONS: meal prep, cleaning,  laundry, interpersonal relationship, shopping, and community activity  PERSONAL FACTORS: Age, Past/current experiences, and 3+ comorbidities: CVA, Parkinson's traits, dementia  are also affecting patient's functional outcome.   REHAB POTENTIAL: Good  CLINICAL DECISION MAKING: Evolving/moderate complexity  EVALUATION COMPLEXITY: Moderate  PLAN:  PT FREQUENCY: 2x/week  PT DURATION: 12 weeks  PLANNED INTERVENTIONS: Therapeutic exercises, Therapeutic activity, Neuromuscular re-education, Balance training, Gait training, Patient/Family education, Self Care, Joint mobilization, Stair training, Moist heat, and Manual therapy  PLAN FOR NEXT SESSION: Trunk and extremity mobilization, balance, strength, functional mobility re-education.  Lum Babe, PT 08/26/22 4:56 PM

## 2022-08-27 ENCOUNTER — Other Ambulatory Visit (HOSPITAL_BASED_OUTPATIENT_CLINIC_OR_DEPARTMENT_OTHER): Payer: Self-pay

## 2022-08-29 DIAGNOSIS — S32019D Unspecified fracture of first lumbar vertebra, subsequent encounter for fracture with routine healing: Secondary | ICD-10-CM | POA: Diagnosis not present

## 2022-08-29 DIAGNOSIS — M5430 Sciatica, unspecified side: Secondary | ICD-10-CM | POA: Diagnosis not present

## 2022-08-29 DIAGNOSIS — G8929 Other chronic pain: Secondary | ICD-10-CM | POA: Diagnosis not present

## 2022-08-29 DIAGNOSIS — R0902 Hypoxemia: Secondary | ICD-10-CM | POA: Diagnosis not present

## 2022-08-29 DIAGNOSIS — I69359 Hemiplegia and hemiparesis following cerebral infarction affecting unspecified side: Secondary | ICD-10-CM | POA: Diagnosis not present

## 2022-08-30 ENCOUNTER — Other Ambulatory Visit (HOSPITAL_BASED_OUTPATIENT_CLINIC_OR_DEPARTMENT_OTHER): Payer: Self-pay

## 2022-08-30 ENCOUNTER — Ambulatory Visit: Payer: PPO | Admitting: Occupational Therapy

## 2022-08-30 DIAGNOSIS — I69354 Hemiplegia and hemiparesis following cerebral infarction affecting left non-dominant side: Secondary | ICD-10-CM

## 2022-08-30 DIAGNOSIS — R2689 Other abnormalities of gait and mobility: Secondary | ICD-10-CM

## 2022-08-30 DIAGNOSIS — M25512 Pain in left shoulder: Secondary | ICD-10-CM

## 2022-08-30 DIAGNOSIS — M6281 Muscle weakness (generalized): Secondary | ICD-10-CM

## 2022-08-30 DIAGNOSIS — I679 Cerebrovascular disease, unspecified: Secondary | ICD-10-CM | POA: Diagnosis not present

## 2022-08-30 DIAGNOSIS — I10 Essential (primary) hypertension: Secondary | ICD-10-CM | POA: Diagnosis not present

## 2022-08-30 DIAGNOSIS — R278 Other lack of coordination: Secondary | ICD-10-CM | POA: Diagnosis not present

## 2022-08-30 NOTE — Therapy (Signed)
OUTPATIENT OCCUPATIONAL THERAPY TREATMENT NOTE  Patient Name: William [name] MRN: [id number] DOB:June 18, 1940, 82 y.o., male Today's Date: 08/30/2022  PCP & REFERRING PROVIDER:  Carollee Herter, Alferd Apa, DO    END OF SESSION:  OT End of Session - 08/30/22 1624     Visit Number 10    Number of Visits 32    Date for OT Re-Evaluation 11/16/22    Authorization Type [employer]    Authorization - Visit Number 10    Progress Note Due on Visit 10    OT Start Time 1532    OT Stop Time 1615    OT Time Calculation (min) 43 min                   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   HTN (hypertension) 07/19/2015   Hyperlipidemia    Major neurocognitive disorder 01/09/2014   Mild, related to stroke history   Nocturia    Renal insufficiency 06/25/2013   S/P carotid endarterectomy    BILATERAL ICA--  PATENT PER DUPLEX  05-19-2012   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/26/2017   Right calf   Urinary frequency    Vitamin D deficiency    Past Surgical History:  Procedure Laterality Date   APPENDECTOMY  AS CHILD   CARDIOVASCULAR STRESS TEST  03-27-2012  DR CRENSHAW   LOW RISK LEXISCAN STUDY-- PROBABLE NORMAL PERFUSION AND SOFT TISSUE ATTENUATION/  NO ISCHEMIA/ EF 51%   CAROTID ENDARTERECTOMY Bilateral LEFT  11-12-2008  DR GREG HAYES   RIGHT ICA  2006  ([facility])   CHOLECYSTECTOMY N/A 02/23/2022   Procedure: LAPAROSCOPIC CHOLECYSTECTOMY;  Surgeon: Felicie Morn, MD;  Location: [facility];  Service: General;  Laterality: N/A;   CYSTOSCOPY W/  RETROGRADES Bilateral 06/22/2021   Procedure: CYSTOSCOPY WITH RETROGRADE PYELOGRAM;  Surgeon: Franchot Gallo, MD;  Location: [facility];  Service: Urology;  Laterality: Bilateral;   CYSTOSCOPY WITH LITHOLAPAXY N/A 02/26/2013   Procedure: CYSTOSCOPY WITH LITHOLAPAXY;  Surgeon: Franchot Gallo, MD;  Location[facility];  Service: Urology;  Laterality: N/A;   ENDOSCOPIC RETROGRADE CHOLANGIOPANCREATOGRAPHY (ERCP) WITH PROPOFOL N/A 02/22/2022   Procedure: ENDOSCOPIC RETROGRADE CHOLANGIOPANCREATOGRAPHY (ERCP) WITH PROPOFOL;  Surgeon: Carol Ada, MD;  Location: [place] [facility];  Service: Gastroenterology;  Laterality: N/A;   EYE SURGERY  Jan. 2016   cataract surgery both eyes   INGUINAL HERNIA REPAIR Right 11-08-2006   IR KYPHO EA ADDL LEVEL THORACIC OR LUMBAR  02/12/2021   IR RADIOLOGIST EVAL & MGMT  02/18/2021   MASS EXCISION N/A 03/03/2016   Procedure: EXCISION OF BACK  MASS;  Surgeon: Stark Klein, MD;  Location: [place];  Service: General;  Laterality: N/A;   MOHS SURGERY Left 1/ 2016   Dr Nevada Crane-- Basal cell   PROSTATE SURGERY     REMOVAL OF STONES  02/22/2022   Procedure: REMOVAL OF STONES;  Surgeon: Carol Ada, MD;  Location: [facility];  Service: Gastroenterology;;   [name]  02/22/2022  Procedure: SPHINCTEROTOMY;  Surgeon: Carol Ada, MD;  Location: [facility] ENDOSCOPY;  Service: Gastroenterology;;   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-C N/A 06/22/2021   Procedure: TRANSURETHRAL RESECTION OF BLADDER TUMOR;  Surgeon: Franchot Gallo, MD;  Location[facility];  Service: Urology;  Laterality: N/A;   TRANSURETHRAL RESECTION OF PROSTATE N/A 02/26/2013   Procedure: TRANSURETHRAL RESECTION OF THE PROSTATE WITH GYRUS INSTRUMENTS;  Surgeon: Franchot Gallo, MD;  Location: [facility];  Service: Urology;  Laterality: N/A;   TRANSURETHRAL RESECTION OF PROSTATE N/A 06/22/2021   Procedure:  TRANSURETHRAL RESECTION OF THE PROSTATE (TURP);  Surgeon: Franchot Gallo, MD;  Location: [facility];  Service: Urology;  Laterality: N/A;   Patient Active Problem List   Diagnosis Date Noted   Dysuria 08/03/2022   Nonintractable headache 07/01/2022   Bilateral impacted cerumen 06/24/2022   Rash 06/15/2022   Postoperative ileus ([place]) 02/27/2022   Ileus, postoperative ([place]) 02/26/2022   Choledocholithiasis 02/19/2022   DNR (do not resuscitate) 02/19/2022   Left elbow pain 01/26/2022   Mid back pain on left side 01/26/2022   Rib pain 01/26/2022   Acute pain of left shoulder 11/12/2021   Leukocytes in urine 11/12/2021   Urinary frequency 11/12/2021   Thrush 10/08/2021   Hemiplegia, dominant side S/P CVA (cerebrovascular accident) ([place]) 09/11/2021   Insomnia    Prediabetes    Acute 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 ([place])    Basal ganglia infarction ([place]) 07/29/2021   Transaminitis 07/27/2021   UTI (urinary tract infection) 07/27/2021   CVA (cerebral vascular accident) ([place]) 07/27/2021   Fall 07/27/2021   Hyperglycemia 07/27/2021   Cholelithiasis 07/27/2021   Hypoxia 07/27/2021   Nausea and vomiting [id number]   Acute metabolic encephalopathy [id number]   Normocytic anemia 07/27/2021   Chronic back pain 07/27/2021   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[place]) 06/22/2021   Closed fracture of first lumbar vertebra with routine healing 02/03/2021   Closed fracture of multiple ribs 11/18/2020   Anxiety 01/29/2020   Leg pain, bilateral 01/29/2020   Ingrown toenail 07/13/2019   Lumbar spondylosis 05/02/2018   Pain in left knee 03/09/2018   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   Trochanteric bursitis of left hip 01/16/2018   Preventative health care 09/26/2017   HTN (hypertension) 07/19/2015   Hyperlipidemia 07/19/2015   Great toe pain 02/11/2014   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9 (ICD-10-CM) - Cerebrovascular accident (CVA), unspecified mechanism ([place])  W19.XXXD (ICD-10-CM) - Fall, subsequent encounter    THERAPY DIAG:  Muscle weakness (generalized)  Hemiplegia and hemiparesis following cerebral infarction affecting left non-dominant side (HCC)  Other lack of coordination  Other abnormalities of gait and mobility  Acute pain of left shoulder  Rationale for Evaluation and Treatment: Rehabilitation  PERTINENT HISTORY: PMHx includes L basal ganglia and corona radiata infarct 07/27/21, L lower homonymous quadrantanopia; R MCA  infarct in 2006 w/ L-sided hemiparesis, NCD, carotid artery disease (bilateral), HTN, HLD,CKD3, vascular dementia, and hx of bladder cancer. He has had falls in 2023, fracturing his clavicle in August, then he had stomach surgery in Sept 2023. He has He needs assist at baseline for ADLs. When last documented in acute rehab after having his gall-bladder removed, (03/09/22) he required moderate to maximum assist x2 for most ADLs (more assist for ADLs the require standing), and they recommended rehab in skilled nursing setting. He apparently got Covid at some point and also attended a SNF. Once he D/C home, he had some episodes of combativeness at night  with "sun-downing."  He is communicative and appropriate, though appears easily confused and distracted, not fully  oriented to time and place (Ax2, states date is 72 and cannot say what city he lives in). His wife provides most details, and he is participative, though a poor historian at times. In a nut shell- he has been suffering from dementia and post-stroke effects for years, and his issues were recently exacerbated in Sept of last year after abdominal surgery. He has less use of Lt "stroke side" and new, ritualistic "ticks" in mouth and Rt hand/arm.  Wife states that they do have a caregiver come M, W, F for 4 hours in the middle of the day to mainly help him bathe and dress, which usually tires him out. He has been more combative towards the end of the day as well.   PRECAUTIONS: Fall and Other: Lt hemi, poor cognition, LATEX and adhesive allergies, etc.  ;WEIGHT BEARING RESTRICTIONS: No   SUBJECTIVE:   SUBJECTIVE STATEMENT: I'm good  Pt accompanied by:  his wife  PAIN:  Are you having pain? Not now at rest Rating: 0/10 at rest now   FALLS: Has patient fallen in last 6 months? Yes. Number of falls at least 1 resulting in clavicle fx (now healed)   LIVING ENVIRONMENT: Lives with: lives with their spouse Lives in: House/apartment Has following equipment at home: manual w/c, 2 w/w, weighted utensils, shower bench (for tub), bed cane, hospital bed, raised toilet, and possibly more  PLOF: Needs assistance with ADLs, Needs assistance with homemaking, Needs assistance with gait, and Needs assistance with transfers  PATIENT GOALS: at eval- Top caregiver goals today:  #1- increase personal hygiene skills (brush teeth, shaving) #2- prepare instant coffee or microwave meal #3- improve functional cognitive skills to play games, etc.,  #4- increase Lt arm usage #5- try to help decrease Rt hand/arm perseveration (flicking, tapping, noise making) #6- Improve fnl tranfers in/out of multiple seats (tub bench, swivel chair, etc.)    OBJECTIVE: (All objective assessments below are from initial evaluation  on: 07/07/22 unless otherwise specified.)   HAND DOMINANCE: Right  ADLs: Overall ADLs: Wife states decreased ability since hospitalization in Sept 23.  Transfers/ambulation related to ADLs: Min A transfer, CGA for mobility Eating: set up Grooming: Mod A- needs assist with oral care, shaving, but can comb his hair UB Dressing: Min A  LB Dressing: Dep  Toileting:  Mod  Bathing: Mod  Tub Shower transfers: Min A  Equipment: Radio broadcast assistant, Grab bars, and Feeding equipment  IADLs: completely dependent for years now, except some ability to use microwave which is now not possible after hospitalization   MOBILITY STATUS: Needs Assist: CGA - Min A, Hx of falls, Festination, difficulty with turns, and neglect on Lt side  POSTURE COMMENTS:  rounded shoulders, forward head, and weight shift left  ACTIVITY  TOLERANCE: Activity tolerance: he became tired and somewhat agitated by the end of the OT evaluation today (after PT and OT about 2.5hours with mostly sedate activities in seated positions)  FUNCTIONAL OUTCOME MEASURES: [facility] 17/30  showing problems with visuospatial/executive function, attention, fluency, and orientation. (at least 26/30 is needed to be considered "normal")  [employer] in ADLs: 1/6  (very dependent)  UPPER EXTREMITY ROM:    Active ROM Right eval Left eval  Shoulder scaption 100 85  Shoulder abduction    Shoulder adduction    Shoulder extension    Shoulder internal rotation Reaches small of back Reaches to hip  Shoulder external rotation Reaches back of head  Reaches up to ear  Elbow flexion 150 130  Elbow extension (-10*)  (-35*)  Wrist flexion 35 20  Wrist extension 50 10  (Blank rows = not tested)  UPPER EXTREMITY MMT:    Tested grossly as "push" and "pull": UE push 4-/5, UE pull 4/5  HAND FUNCTION: Grip strength: Right: 30 lbs; Left: 20 lbs  COORDINATION: Finger Nose Finger test: mildly ataxic but fairly accurate  Box and Blocks:  Right  11blocks, Left 5blocks  MUSCLE TONE:  Rt- normal, Lt- decreased and worsened by inattention/disuse  COGNITION: Overall cognitive status: Impaired and Difficulty to assess due to: severity of deficits; Poor attention, fluency, orientation, and executive function. Recall is fairly good as well as abstraction.  Seems to have personality changes due to vascular dementia- like an OCD type volitional perseveration of tapping with Rt hand that he can somewhat control.  He did become angry and agitated at the end of the session performing car transfer with spouse: he did shout at her.  VISION: Subjective report: no new changes per pt/spouse, uses "cheaters"   VISION ASSESSMENT: Visual fields seem intact though he had latent responses on the left side showing likely inattention/and/neglect, tracking somewhat delayed horizontally and vertically  PERCEPTION: Impaired: Inattention/neglect: does not attend to left visual field and does not attend to left side of body, Body scheme: poor quality of recognition with shaving, etc., and Spatial orientation: decreased in print and in orientation, especially to Lt side  PRAXIS: Not tested specifically but no overt/stated issues using pen, wearing glasses ,etc.   OBSERVATIONS: Pt taps and flicks and does seem to volitionally perseverate on making a rhythm/noise with Rt hand. He states he is aware of it and chooses to do it.  He does also seem to have mild resting and intention tremor.    TODAY'S TREATMENT:     DATE: 08/30/22- Pt practiced doffing and donning his sweatshirt jacket, mod v.c and facilitation seated on mat Standing at countertop to reach for containers and open/ close lids while holding with one hand and opening with the other, closes supervision/ min v.c Flipping large playing cards with left and right hands, min v.c and min-mod difficulty for LUE use. Pt attempted to teach therapist how to play a card game however pt became distracted, despite  attempts to refocus by therapist. Pt was able to use both hands together to deal cards and to gather the stack of cards. Placing large plastic pegs into pegboard with right and left UE's, mod difficulty/ facilitation for placing pegs with LUE, min difficulty and v.c for removing. Pt became distracted about 30  ins into treatment session and demonstrates difficulty sustaining focus in a minimally distracting environment.  08/25/22- Therapist assisted pt with scooting up to the table, pt's RUE was pinched between table and chair. Pt  with small bruise at R thumb IP knuckle. Pt's wife aware. Pt was in no distress after initial pinch.   Pt's wife brought in newspaper, pt practiced flipping pages on tabletop, mod v.c and facilitation. Pt located the sports section with min-mod questioning cues. Therapist added cognitive component by having pt locate certain items in the paper with mod v.c.  Tossing and catching ball with bilateral UE's while seated on the mat, mod v.c for LUE use. Therapist added cognitive component by having pt answer questions or name an item for a specific letter of the alphabet.  Placing/ removing wooden dowel pegs in pegboard with right and left UE's to follow specific verbal instuctions, mod v.c and min-mod difficulty with LUE use. Pt continues to neglect LUE.  Pt left in no distress, stating it was a good session.   08/24/22 Therapist checked progress towards goals today in preparation for renewal. Pt's wife reports pt has not progressed as quickly as in the past and she would like to focus more on functional activities.  Therapist discussed and developed updated goals with patient and wife. See goals for progress and updates.  Pt's wife brought in several containers and pt practiced opening and closing with min-supervision and min v.c Standing at countertop pt opened coffee container and scooped coffee into a cup with supervision/ minguard for balance and min-mod v.c  Donning  and doffing flannel shirt several repetitions with mod A initially then improved to min A and mod v.c  Ambulating to and from chair with practice backing up, slowing down and reaching back with bilateral UE's to encourage pt to sit more squarely in seat, mod difficulty/ mod v.c and pt still was not completely even in seat but improved.        PATIENT EDUCATION: Education details: See treatment section above for details, progress towards goals and updated goals. Person educated: Patient and Spouse Education method: Explanation, Demonstration, Tactile cues, and Verbal cues Education comprehension: verbalized understanding, returned demonstration, verbal cues required, tactile cues required, and needs further education  HOME EXERCISE PROGRAM: Access Code: [email or web address] URL: [email or web address] Date: 07/21/2022 Prepared by: Benito Mccreedy Exercises - Seated Shoulder Flexion AAROM with Dowel  - 4-6 x daily - 1 sets - 10-15 reps - Seated Shoulder Abduction AAROM with Dowel  - 4-6 x daily - 1 sets - 10-15 reps - Seated Shoulder External Rotation AAROM with Dowel  - 4-6 x daily - 1 sets - 10-15 reps - Seated Bicep Curls with Bar  - 4-6 x daily - 1 sets - 10-15 reps - Seated Shoulder Row with Anchored Resistance  - 4-6 x daily - 1 sets - 10-15 reps     GOALS: Goals reviewed with patient? Yes   SHORT TERM GOALS: (STG required if POC>30 days) Target Date: 09/23/22   Pt will demo/state understanding of initial HEP (or home activity suggestions to maintain function as appropriate) to improve functional skills. Goal status: 07/28/22- Met (addressed in past 2 sessions) 2. Pt will donn a t-shirt consistently with min A and v.c   Goal status: New 3. Pt will demonstrate ability to open the newspaper    with supervision min v.c Goal status: New 4.  Pt will consistently sustain focus to a simple functional task  in a minimally distracting environment x 6 mins with no more than 1  re-direct(ie: eating, functional task in clinic) Baseline:  Goal status: NEW 5. Pt will consistently open containers with only supervision and min v.c  Goal status:New    LONG TERM GOALS: Target Date: 11/16/22  Pt will improve functional ability by decreased impairment per Redmond Baseman assessment from 1/6 to 2/6 or better, for better quality of life. Goal status:  not met 1/6  2.  Caregiver will state his grooming ability/hygiene ability improved from moderate assist to supervision assist. Goal status: not met-Pt continues to require mod A.  3.  He will show better functional use of left arm by improved box and blocks Test score from 5 blocks to at least 10 blocks. Goal status:not met but improved 8 blocks  4.  Pt will show ability to microwave instant coffee or small meal safely, to help decrease caregiver burden. Goal status: partially met, pt is able to microwave coffee with supervision, however occasional min A to open containers 5.  He will show improved cognitive ability enough to play a simple game with OT and caregiver. Goal status: approximating goal, pt has played a game of uno at home with family.  6.  Caregiver will state at least 4 ways to decrease perseveration with right arm, also stating that this behaviors at least somewhat better in the home now. Goal status: deferred perseverative behaviors have improved.  7. Pt will donn a flannel button front shirt consistently with min A and min v.c Baseline: mod-max Goal status: NEW 8.  Pt will use his LUE as an active assist for ADLS grossly [id number] of the time with min v.c Baseline: uses 25% Goal status: NEW   9 Pt will stand at the countertop and perform functional activities  incorporating bilateral UE's with supervision and min v.c  Goal status: NEW 10. Pt will back up to a chair and sit down squarely at least 1/3 of the time with min v.c  Goal status: new 11..  Pt will open the newspaper and locate sports  section with no more than min v.c                          Goal status: NEW  ASSESSMENT:  CLINICAL IMPRESSION: For the reporting period of 07/07/22- 08/30/22, pt demonstrates progress, however pt has not fully achieved goals as pt goals were updated on 08/24/22. Pt can benefit from continued skilled occupational therapy to address bilateral UE functional use, cognitive deficits, L neglect, functional mobility and balance in order to maximize pt's safety and I with ADLs/IADLs. OT Frequency/DURATION: 2x week x 12 weeks however anticipate d/c after 8 weeks dependent on progress  PLANNED INTERVENTIONS: self care/ADL training, therapeutic exercise, therapeutic activity, neuromuscular re-education, manual therapy, balance training, functional mobility training, moist heat, cryotherapy, patient/family education, cognitive remediation/compensation, visual/perceptual remediation/compensation, psychosocial skills training, energy conservation, coping strategies training, DME and/or AE instructions, and Re-evaluation  RECOMMENDED OTHER SERVICES: He has already seen PT for balance and functional mobility, he would probably benefit from speech for cognitive issues as well, though OT will be working with cognition somewhat functionally now.  CONSULTED AND AGREED WITH PLAN OF CARE: Patient and family member/caregiver  PLAN FOR NEXT SESSION:  Flipping pages in newspaper, functional use of bilateral UE's, donning doffing pullover shirt.  Theone Murdoch, OTR/L 4:26 PM 08/30/22

## 2022-08-31 ENCOUNTER — Encounter: Payer: Self-pay | Admitting: Physical Therapy

## 2022-08-31 ENCOUNTER — Other Ambulatory Visit (HOSPITAL_BASED_OUTPATIENT_CLINIC_OR_DEPARTMENT_OTHER): Payer: Self-pay

## 2022-08-31 ENCOUNTER — Ambulatory Visit: Payer: PPO | Admitting: Physical Therapy

## 2022-08-31 ENCOUNTER — Ambulatory Visit: Payer: PPO | Admitting: Podiatry

## 2022-08-31 DIAGNOSIS — R278 Other lack of coordination: Secondary | ICD-10-CM

## 2022-08-31 DIAGNOSIS — R262 Difficulty in walking, not elsewhere classified: Secondary | ICD-10-CM

## 2022-08-31 DIAGNOSIS — I69354 Hemiplegia and hemiparesis following cerebral infarction affecting left non-dominant side: Secondary | ICD-10-CM

## 2022-08-31 DIAGNOSIS — R2689 Other abnormalities of gait and mobility: Secondary | ICD-10-CM

## 2022-08-31 DIAGNOSIS — Z9181 History of falling: Secondary | ICD-10-CM

## 2022-08-31 DIAGNOSIS — M6281 Muscle weakness (generalized): Secondary | ICD-10-CM

## 2022-08-31 NOTE — Therapy (Signed)
OUTPATIENT PHYSICAL THERAPY NEURO Treatment   Patient Name: William [name] MRN: [id number] DOB:12/20/1940, 82 y.o., male Today's Date: 08/31/2022  PCP: Lowne Chase, [place] PROVIDER: same  END OF SESSION:  PT End of Session - 08/31/22 1453     Visit Number 16    Date for PT Re-Evaluation 09/29/22    PT Start Time 1445    PT Stop Time 1529    PT Time Calculation (min) 44 min    Equipment Utilized During Treatment Gait belt    Activity Tolerance Patient tolerated treatment well    Behavior During Therapy Impulsive             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   HTN (hypertension) 07/19/2015   Hyperlipidemia    Major neurocognitive disorder 01/09/2014   Mild, related to stroke history   Nocturia    Renal insufficiency 06/25/2013   S/P carotid endarterectomy    BILATERAL ICA--  PATENT PER DUPLEX  05-19-2012   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/26/2017   Right calf   Urinary frequency    Vitamin D deficiency    Past Surgical History:  Procedure Laterality Date   APPENDECTOMY  AS CHILD   CARDIOVASCULAR STRESS TEST  03-27-2012  DR CRENSHAW   LOW RISK LEXISCAN STUDY-- PROBABLE NORMAL PERFUSION AND SOFT TISSUE ATTENUATION/  NO ISCHEMIA/ EF 51%   CAROTID ENDARTERECTOMY Bilateral LEFT  11-12-2008  DR GREG HAYES   RIGHT ICA  2006  ([facility])   CHOLECYSTECTOMY N/A 02/23/2022   Procedure: LAPAROSCOPIC CHOLECYSTECTOMY;  Surgeon: Felicie Morn, MD;  Location: [place];  Service: General;  Laterality: N/A;   CYSTOSCOPY W/ RETROGRADES  Bilateral 06/22/2021   Procedure: CYSTOSCOPY WITH RETROGRADE PYELOGRAM;  Surgeon: Franchot Gallo, MD;  Location: [place];  Service: Urology;  Laterality: Bilateral;   CYSTOSCOPY WITH LITHOLAPAXY N/A 02/26/2013   Procedure: CYSTOSCOPY WITH LITHOLAPAXY;  Surgeon: Franchot Gallo, MD;  Location[facility];  Service: Urology;  Laterality: N/A;   ENDOSCOPIC RETROGRADE CHOLANGIOPANCREATOGRAPHY (ERCP) WITH PROPOFOL N/A 02/22/2022   Procedure: ENDOSCOPIC RETROGRADE CHOLANGIOPANCREATOGRAPHY (ERCP) WITH PROPOFOL;  Surgeon: Carol Ada, MD;  Location: [place];  Service: Gastroenterology;  Laterality: N/A;   EYE SURGERY  Jan. 2016   cataract surgery both eyes   INGUINAL HERNIA REPAIR Right 11-08-2006   IR KYPHO EA ADDL LEVEL THORACIC OR LUMBAR  02/12/2021   IR RADIOLOGIST EVAL & MGMT  02/18/2021   MASS EXCISION N/A 03/03/2016   Procedure: EXCISION OF BACK  MASS;  Surgeon: Stark Klein, MD;  Location: [place];  Service: General;  Laterality: N/A;   MOHS SURGERY Left 1/ 2016   Dr Nevada Crane-- Basal cell   PROSTATE SURGERY     REMOVAL OF STONES  02/22/2022   Procedure: REMOVAL OF STONES;  Surgeon: Carol Ada, MD;  Location: [place];  Service: Gastroenterology;;   [name]  02/22/2022   Procedure: [name];  Surgeon: Carol Ada, MD;  Location: [place];  Service: Gastroenterology;;   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-C N/A 06/22/2021   Procedure: TRANSURETHRAL RESECTION OF BLADDER TUMOR;  Surgeon: Franchot Gallo, MD;  Location: [facility];  Service: Urology;  Laterality: N/A;   TRANSURETHRAL RESECTION OF PROSTATE N/A 02/26/2013   Procedure: TRANSURETHRAL RESECTION OF THE PROSTATE WITH GYRUS INSTRUMENTS;  Surgeon: Franchot Gallo, MD;  Location: [facility];  Service: Urology;  Laterality: N/A;   TRANSURETHRAL RESECTION OF PROSTATE N/A 06/22/2021   Procedure: TRANSURETHRAL  RESECTION OF THE PROSTATE (TURP);  Surgeon: Franchot Gallo, MD;  Location: [facility];  Service: Urology;  Laterality: N/A;   Patient Active Problem List   Diagnosis Date Noted   Dysuria 08/03/2022   Nonintractable headache 07/01/2022   Bilateral impacted cerumen 06/24/2022   Rash 06/15/2022   Postoperative ileus ([place]) 02/27/2022   Ileus, postoperative ([place]) 02/26/2022   Choledocholithiasis 02/19/2022   DNR (do not resuscitate) 02/19/2022   Left elbow pain 01/26/2022   Mid back pain on left side 01/26/2022   Rib pain 01/26/2022   Acute pain of left shoulder 11/12/2021   Leukocytes in urine 11/12/2021   Urinary frequency 11/12/2021   Thrush 10/08/2021   Hemiplegia, dominant side S/P CVA (cerebrovascular accident) ([place]) 09/11/2021   Insomnia    Prediabetes    Acute 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 ([place])    Basal ganglia infarction ([place]) 07/29/2021   Transaminitis 07/27/2021   UTI (urinary tract infection) 07/27/2021   CVA (cerebral vascular accident) ([place]) 07/27/2021   Fall 07/27/2021   Hyperglycemia 07/27/2021   Cholelithiasis 07/27/2021   Hypoxia 07/27/2021   Nausea and vomiting [id number]   Acute metabolic encephalopathy [id number]   Normocytic anemia 07/27/2021   Chronic back pain 07/27/2021   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[place]) 06/22/2021   Closed fracture of first lumbar vertebra with routine healing 02/03/2021   Closed fracture of multiple ribs 11/18/2020   Anxiety 01/29/2020   Leg pain, bilateral 01/29/2020   Ingrown toenail 07/13/2019   Lumbar spondylosis 05/02/2018   Pain in left knee 03/09/2018   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   Trochanteric bursitis of left hip 01/16/2018   Preventative health care 09/26/2017   HTN (hypertension) 07/19/2015   Hyperlipidemia 07/19/2015   Great toe pain 02/11/2014   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   Obesity (BMI 30-39.9) 06/25/2013    Renal insufficiency 06/25/2013   Weakness of left arm 06/25/2013   Sebaceous cyst 03/03/2011   Sprain of lumbar region 07/31/2010   Rib pain, left 08/29/2009   Carotid artery stenosis, asymptomatic, bilateral 05/02/2009   Eczema, atopic 05/31/2008   Vitamin D deficiency 03/01/2008   BPH (benign prostatic hyperplasia) 08/06/2007   Fasting hyperglycemia 12/21/2006   History of right MCA infarct 2006    ONSET DATE: 06/29/22  REFERRING DIAG:  Diagnosis  I63.9 (ICD-10-CM) - Cerebrovascular accident (CVA), unspecified mechanism ([place])  W19.XXXD (ICD-10-CM) - Fall, subsequent encounter    THERAPY DIAG:  Muscle weakness (generalized)  Hemiplegia and hemiparesis following cerebral infarction affecting left non-dominant side (HCC)  Other lack of coordination  Other abnormalities of gait and mobility  History of falling  Difficulty in walking, not elsewhere classified  Rationale for Evaluation and Treatment: Rehabilitation  SUBJECTIVE:  SUBJECTIVE STATEMENT: Wife reports that he got up on his own yesterday to get pizza at the door, he was bent over holding onto the door, some scratch to the hand PERTINENT HISTORY:  FADIL KEYSOR is a 82 year old man with dementia, CKD, HTN, CAD, HLD and history of CVA  (2006, 2021), Parkinson's  PAIN:  Are you having pain? No  PRECAUTIONS: Fall  WEIGHT BEARING RESTRICTIONS: No  FALLS: Has patient fallen in last 6 months? No  LIVING ENVIRONMENT: Lives with: lives with their family and lives with their spouse Lives in: House/apartment Stairs:  1 brick high step Has following equipment at home: Environmental consultant - 2 wheeled, Wheelchair (manual), Shower bench, bed side commode, Grab bars, and hospital bed, sliding pad for car  PLOF: Independent with basic ADLs and  Independent with household mobility without device  PATIENT GOALS: Walk around the block with his wife. Be able to use the dining room chair rather than have to use the W/C. Increased I with bed mobility and simple tasks at home like make some coffee or brush his teeth, Step over tub to shower, has bench, but he can't really use it. Up and down the one step to get to back deck.  OBJECTIVE:   DIAGNOSTIC FINDINGS:  MRI 2021 with degenerative changes in lumbar spine, L3-4 R subarticular stenosis  COGNITION: Overall cognitive status: History of cognitive impairments - at baseline   SENSATION: Not tested  COORDINATION: Moderately impaired BLE   MUSCLE LENGTH: Hamstrings: severely restricted B Thomas test: Severely restricted B.  POSTURE: rounded shoulders, forward head, increased thoracic kyphosis, posterior pelvic tilt, and flexed trunk   LOWER EXTREMITY ROM:   BLE extremely stiff throughout, limited hip ROM in all planes, B knees limited in extension.   LOWER EXTREMITY MMT:  3+/5 throughout. Unable to determine accuracy of MMT due to cognition. Functionally demosntrates poor coordinated activation and poor muscular endurance.   BED MOBILITY: Min A for Supine<> Sit. Patient was using a ladder in his hospital bed and could move MI.  TRANSFERS: Min A, mod VC, tends to lean back.  CURB: Min A-Has one step out to his deck.  STAIRS: N/A   GAIT: Gait pattern: step to pattern, decreased arm swing- Right, decreased arm swing- Left, decreased step length- Right, decreased step length- Left, shuffling, festinating, trunk flexed, narrow BOS, poor foot clearance- Right, and poor foot clearance- Left Distance walked: 52' Assistive device utilized: Environmental consultant - 2 wheeled Level of assistance: Occasional min A Comments: mod VC to stay close to walker, take longer steps.  FUNCTIONAL TESTS:  5 times sit to stand: 38.23 Timed up and go (TUG): 47.91  with RW, 07/21/22 TUG 29 seconds without  device  TODAY'S TREATMENT:                                                                                                                               DATE:  08/31/22 Nustep level  5 x 6 mintues On airex single arm 5# pulls Volleyball standing Ball kicks Picking up ball from floor Sitting and standing cone reach and pick up working on balance, weight shift and reach 20# resisted gait front and back Direction changes 4" toe touches Bed mobility getting up from supine Worked on safe sitting turning and feeling the chair on both legs  08/26/22 I met him at the car and walked in the grass and did some curb negotiating and more walking. Seated volley ball 5# straight arm pulls on and off airex Leg press 20# 2x10 Side stepping, fwd and backward walking 2# LAQ 2# marches 2# hip abduction Worked on sitting trunk motions side to side and leaning forward as he was tending to lean to the back today USe of SPC cone toe touches  08/24/22 volleyball standing Standing ball kicks Nustep level 5 x 6 minutes Walk outside partially around [place] and then rest and then back in, the first part of the walk was great, good steps, once he got on grass much more difficult then this was tough to recover from Side stepping, back ward walking Fast walking Picking up cones on the mat table having turn turn, twist and reach, then did from the floor and then did reaching on top of the weight tower Yellow tband ankle exercises Green tband HS curls   08/18/22 UBE level 1 x 4 minutes Bike level 1 x 5 minutes Nustep level 4 x 5 minutes Sit to stand from mat table slightly raised a lot of cues Side stepping Forward and backward walking, direction changes Seated elbow touches Supine, trunk rotations, partial sit ups, left leg hip abduction with use of sliding board, feet on ball K2C, trunk rotation, bridges and isometric abs Walking 200 feet with cues  08/16/22 Standing hip marches and hip abduction  and extension in Pbars with a lot of cues for the movements Stairs step over step with one hand rail and verbal cues to assure clearing the toe UBE level 3 x 5 minutes Leg press with a lot of help getting on and off 20# a lot of cues needed for bigger motions Side stepping, forward and back ward walking with HHA Gait outside down small slope, down and up curb then back with a rest with CGA and a lot of cues Gait outside the front 200 feet to the car  08/11/22 Side stepping in pbars Marches, hip abduction, extension Fast walking Stairs 4" and 6" Nustep level 5 x 6 minutes Sit to stand multiple times and multiple ways On airex head turns, reaching Seated volleyball, standing volleyball Seated ball kicks Gait outside around [place] and to the car   08/09/22 NuStep L5 x 6 minutes Functional status re-assessed for PN-see goals. Balance training-standing on Air ex pad in parallel bars, rotation to L, holding therapist's hand only for balance, up to min A.   PATIENT EDUCATION: Education details: POC Person educated: Patient and Spouse Education method: Explanation Education comprehension: verbalized understanding  HOME EXERCISE PROGRAM: TBD  GOALS: Goals reviewed with patient? Yes  SHORT TERM GOALS: Target date: 07/19/22  I with initial HEP Baseline: Goal status: ongoing  2. Decrease 5 x STS to < 25 sec to demonstrate improved functional strength  Baseline: 38 sec  Goal status: 08/09/22 14 sec-met  3. Decrease TUG to < 35 sec to demonstrate improved functional balance  Baseline: 47 sec  Goal status: met 07/21/22  4. Perform sit <> stand transfers from average chair  height with S and VC to be able to use the swivel chair at his dining table.  Baseline: Min A  Goal status: 08/09/22-Inconsistently performs  with S from average height. At times requires CGA to cue forward weight shift. ongoing  LONG TERM GOALS: Target date: 09/29/22  Patient will be able to step over his  tub at home using grab bars for stability with CGA to access his shower. Baseline: Unable- has bench, but wife reports he steps over then sits. Goal status: ongoing 08/09/22  2.  Decrease 5 x STS to < 18 sec to demonstrate improved functional strength Baseline:  Goal status: 15-2/26/24-met  3.  Decrease TUG to < 25 sec to demonstrate improved functional balance Baseline:  Goal status: 08/09/22  19 sec-met  4.  Patient will perform his bed mobility with MI. Baseline: Min A Goal status: 08/09/22- I as demo on mat-met  5.  Patient will ambulate at least 400' with LRAD, CGA, demonstrate improved upright posture, longer step length. Baseline:  Goal status: 08/09/22-Amb 400', but began to fatigue after about 300' with more flexed posture, L foot shuffling more, unable to maintain safe technique. ongoing  6.  Patient will manage up and down a single step with LRAD, CGA to access his deck at home. Baseline: Unable Goal status: 08/09/22-Up and down 1 step with U rail. ongoing  ASSESSMENT:  CLINICAL IMPRESSION: I had to turn off the TV today to decrease the distraction.  HE struggled more today with the steps, definitely shuffled the left leg.  We worked on bed mobility getting up from supine and safe transfers, He did get up on his own and opened the door at home. OBJECTIVE IMPAIRMENTS: Abnormal gait, decreased activity tolerance, decreased balance, decreased cognition, decreased coordination, decreased endurance, decreased mobility, difficulty walking, decreased ROM, decreased strength, decreased safety awareness, impaired flexibility, impaired UE functional use, improper body mechanics, and postural dysfunction.   ACTIVITY LIMITATIONS: carrying, lifting, bending, sitting, standing, squatting, stairs, transfers, bed mobility, bathing, toileting, dressing, hygiene/grooming, and locomotion level  PARTICIPATION LIMITATIONS: meal prep, cleaning, laundry, interpersonal relationship, shopping, and  community activity  PERSONAL FACTORS: Age, Past/current experiences, and 3+ comorbidities: CVA, Parkinson's traits, dementia  are also affecting patient's functional outcome.   REHAB POTENTIAL: Good  CLINICAL DECISION MAKING: Evolving/moderate complexity  EVALUATION COMPLEXITY: Moderate  PLAN:  PT FREQUENCY: 2x/week  PT DURATION: 12 weeks  PLANNED INTERVENTIONS: Therapeutic exercises, Therapeutic activity, Neuromuscular re-education, Balance training, Gait training, Patient/Family education, Self Care, Joint mobilization, Stair training, Moist heat, and Manual therapy  PLAN FOR NEXT SESSION: Trunk and extremity mobilization, balance, strength, functional mobility re-education.  Lum Babe, PT 08/31/22 2:56 PM

## 2022-09-01 ENCOUNTER — Ambulatory Visit: Payer: PPO | Admitting: Occupational Therapy

## 2022-09-01 ENCOUNTER — Other Ambulatory Visit (HOSPITAL_BASED_OUTPATIENT_CLINIC_OR_DEPARTMENT_OTHER): Payer: Self-pay

## 2022-09-01 DIAGNOSIS — M25512 Pain in left shoulder: Secondary | ICD-10-CM

## 2022-09-01 DIAGNOSIS — I69318 Other symptoms and signs involving cognitive functions following cerebral infarction: Secondary | ICD-10-CM

## 2022-09-01 DIAGNOSIS — R2689 Other abnormalities of gait and mobility: Secondary | ICD-10-CM

## 2022-09-01 DIAGNOSIS — R41840 Attention and concentration deficit: Secondary | ICD-10-CM

## 2022-09-01 DIAGNOSIS — R278 Other lack of coordination: Secondary | ICD-10-CM | POA: Diagnosis not present

## 2022-09-01 DIAGNOSIS — M6281 Muscle weakness (generalized): Secondary | ICD-10-CM

## 2022-09-01 DIAGNOSIS — I69354 Hemiplegia and hemiparesis following cerebral infarction affecting left non-dominant side: Secondary | ICD-10-CM
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
## 2022-09-02 ENCOUNTER — Ambulatory Visit: Payer: PPO | Admitting: Physical Therapy

## 2022-09-02 ENCOUNTER — Encounter: Payer: Self-pay | Admitting: Physical Therapy

## 2022-09-02 ENCOUNTER — Ambulatory Visit (INDEPENDENT_AMBULATORY_CARE_PROVIDER_SITE_OTHER): Payer: PPO | Admitting: Podiatry

## 2022-09-02 DIAGNOSIS — M6281 Muscle weakness (generalized): Secondary | ICD-10-CM

## 2022-09-02 DIAGNOSIS — I69354 Hemiplegia and hemiparesis following cerebral infarction affecting left non-dominant side: Secondary | ICD-10-CM

## 2022-09-02 DIAGNOSIS — R278 Other lack of coordination: Secondary | ICD-10-CM

## 2022-09-02 DIAGNOSIS — B351 Tinea unguium: Secondary | ICD-10-CM | POA: Diagnosis not present

## 2022-09-02 DIAGNOSIS — Z7901 Long term (current) use of anticoagulants: Secondary | ICD-10-CM

## 2022-09-02 DIAGNOSIS — R296 Repeated falls: Secondary | ICD-10-CM

## 2022-09-02 DIAGNOSIS — M79676 Pain in unspecified toe(s): Secondary | ICD-10-CM | POA: Diagnosis not present

## 2022-09-02 DIAGNOSIS — R262 Difficulty in walking, not elsewhere classified: Secondary | ICD-10-CM

## 2022-09-02 NOTE — Therapy (Signed)
OUTPATIENT PHYSICAL THERAPY NEURO Treatment   Patient Name: William [name] MRN: [id number] DOB:1940-10-27, 82 y.o., male Today's Date: 09/02/2022  PCP: Lowne Chase, [place] PROVIDER: same  END OF SESSION:  PT End of Session - 09/02/22 1545     Visit Number 17    Date for PT Re-Evaluation 09/29/22    PT Start Time 1535    PT Stop Time 1615    PT Time Calculation (min) 40 min    Activity Tolerance Patient tolerated treatment well    Behavior During Therapy Impulsive             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   HTN (hypertension) 07/19/2015   Hyperlipidemia    Major neurocognitive disorder 01/09/2014   Mild, related to stroke history   Nocturia    Renal insufficiency 06/25/2013   S/P carotid endarterectomy    BILATERAL ICA--  PATENT PER DUPLEX  05-19-2012   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/26/2017   Right calf   Urinary frequency    Vitamin D deficiency    Past Surgical History:  Procedure Laterality Date   APPENDECTOMY  AS CHILD   CARDIOVASCULAR STRESS TEST  03-27-2012  DR CRENSHAW   LOW RISK LEXISCAN STUDY-- PROBABLE NORMAL PERFUSION AND SOFT TISSUE ATTENUATION/  NO ISCHEMIA/ EF 51%   CAROTID ENDARTERECTOMY Bilateral LEFT  11-12-2008  DR GREG HAYES   RIGHT ICA  2006  ([facility])   CHOLECYSTECTOMY N/A 02/23/2022   Procedure: LAPAROSCOPIC CHOLECYSTECTOMY;  Surgeon: Felicie Morn, MD;  Location: [place];  Service: General;  Laterality: N/A;   CYSTOSCOPY W/ RETROGRADES Bilateral 06/22/2021   Procedure: CYSTOSCOPY WITH  RETROGRADE PYELOGRAM;  Surgeon: Franchot Gallo, MD;  Location: [facility];  Service: Urology;  Laterality: Bilateral;   CYSTOSCOPY WITH LITHOLAPAXY N/A 02/26/2013   Procedure: CYSTOSCOPY WITH LITHOLAPAXY;  Surgeon: Franchot Gallo, MD;  Location: [facility];  Service: Urology;  Laterality: N/A;   ENDOSCOPIC RETROGRADE CHOLANGIOPANCREATOGRAPHY (ERCP) WITH PROPOFOL N/A 02/22/2022   Procedure: ENDOSCOPIC RETROGRADE CHOLANGIOPANCREATOGRAPHY (ERCP) WITH PROPOFOL;  Surgeon: Carol Ada, MD;  Location: [place];  Service: Gastroenterology;  Laterality: N/A;   EYE SURGERY  Jan. 2016   cataract surgery both eyes   INGUINAL HERNIA REPAIR Right 11-08-2006   IR KYPHO EA ADDL LEVEL THORACIC OR LUMBAR  02/12/2021   IR RADIOLOGIST EVAL & MGMT  02/18/2021   MASS EXCISION N/A 03/03/2016   Procedure: EXCISION OF BACK  MASS;  Surgeon: Stark Klein, MD;  Location: [facility];  Service: General;  Laterality: N/A;   MOHS SURGERY Left 1/ 2016   Dr Nevada Crane-- Basal cell   PROSTATE SURGERY     REMOVAL OF STONES  02/22/2022   Procedure: REMOVAL OF STONES;  Surgeon: Carol Ada, MD;  Location: [place];  Service: Gastroenterology;;   [name]  02/22/2022   Procedure: [name];  Surgeon: Carol Ada, MD;  Location: [place];  Service: Gastroenterology;;   TRANSURETHRAL RESECTION OF BLADDER TUMOR  WITH MITOMYCIN-C N/A 06/22/2021   Procedure: TRANSURETHRAL RESECTION OF BLADDER TUMOR;  Surgeon: Franchot Gallo, MD;  Location: [facility];  Service: Urology;  Laterality: N/A;   TRANSURETHRAL RESECTION OF PROSTATE N/A 02/26/2013   Procedure: TRANSURETHRAL RESECTION OF THE PROSTATE WITH GYRUS INSTRUMENTS;  Surgeon: Franchot Gallo, MD;  Location: [facility];  Service: Urology;  Laterality: N/A;   TRANSURETHRAL RESECTION OF PROSTATE N/A 06/22/2021   Procedure: TRANSURETHRAL RESECTION OF THE PROSTATE (TURP);  Surgeon: Franchot Gallo, MD;  Location: [facility];  Service: Urology;  Laterality: N/A;   Patient Active Problem List   Diagnosis Date Noted   Dysuria 08/03/2022   Nonintractable headache 07/01/2022   Bilateral impacted cerumen 06/24/2022   Rash 06/15/2022   Postoperative ileus ([place]) 02/27/2022   Ileus, postoperative ([place]) 02/26/2022   Choledocholithiasis 02/19/2022   DNR (do not resuscitate) 02/19/2022   Left elbow pain 01/26/2022   Mid back pain on left side 01/26/2022   Rib pain 01/26/2022   Acute pain of left shoulder 11/12/2021   Leukocytes in urine 11/12/2021   Urinary frequency 11/12/2021   Thrush 10/08/2021   Hemiplegia, dominant side S/P CVA (cerebrovascular accident) ([place]) 09/11/2021   Insomnia    Prediabetes    Acute 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 ([place])    Basal ganglia infarction ([place]) 07/29/2021   Transaminitis 07/27/2021   UTI (urinary tract infection) 07/27/2021   CVA (cerebral vascular accident) ([place]) 07/27/2021   Fall 07/27/2021   Hyperglycemia 07/27/2021   Cholelithiasis 07/27/2021   Hypoxia 07/27/2021   Nausea and vomiting [id number]   Acute metabolic encephalopathy 123XX123   Normocytic anemia 07/27/2021   Chronic back pain 07/27/2021   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[place]) 06/22/2021   Closed fracture of first lumbar vertebra with routine healing 02/03/2021   Closed fracture of multiple ribs 11/18/2020   Anxiety 01/29/2020   Leg pain, bilateral 01/29/2020   Ingrown toenail 07/13/2019   Lumbar spondylosis 05/02/2018   Pain in left knee 03/09/2018   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   Trochanteric bursitis of left hip 01/16/2018   Preventative health care 09/26/2017   HTN (hypertension) 07/19/2015   Hyperlipidemia 07/19/2015   Great toe pain 02/11/2014   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   Obesity (BMI 30-39.9) 06/25/2013   Renal insufficiency 06/25/2013   Weakness of left  arm 06/25/2013   Sebaceous cyst 03/03/2011   Sprain of lumbar region 07/31/2010   Rib pain, left 08/29/2009   Carotid artery stenosis, asymptomatic, bilateral 05/02/2009   Eczema, atopic 05/31/2008   Vitamin D deficiency 03/01/2008   BPH (benign prostatic hyperplasia) 08/06/2007   Fasting hyperglycemia 12/21/2006   History of right MCA infarct 2006    ONSET DATE: 06/29/22  REFERRING DIAG:  Diagnosis  I63.9 (ICD-10-CM) - Cerebrovascular accident (CVA), unspecified mechanism ([place])  W19.XXXD (ICD-10-CM) - Fall, subsequent encounter    THERAPY DIAG:  Muscle weakness (generalized)  Hemiplegia and hemiparesis following cerebral infarction affecting left non-dominant side (HCC)  Other lack of coordination  Difficulty in walking, not elsewhere classified  Repeated falls  Rationale for Evaluation and Treatment: Rehabilitation  SUBJECTIVE:  SUBJECTIVE STATEMENT: Wife reports that he is having a little more difficulty getting his left foot out of the car. PERTINENT HISTORY:  ZEPH RIEBEL is a 82 year old man with dementia, CKD, HTN, CAD, HLD and history of CVA  (2006, 2021), Parkinson's  PAIN:  Are you having pain? No  PRECAUTIONS: Fall  WEIGHT BEARING RESTRICTIONS: No  FALLS: Has patient fallen in last 6 months? No  LIVING ENVIRONMENT: Lives with: lives with their family and lives with their spouse Lives in: House/apartment Stairs:  1 brick high step Has following equipment at home: Environmental consultant - 2 wheeled, Wheelchair (manual), Shower bench, bed side commode, Grab bars, and hospital bed, sliding pad for car  PLOF: Independent with basic ADLs and Independent with household mobility without device  PATIENT GOALS: Walk around the block with his wife. Be able to use the dining room chair rather  than have to use the W/C. Increased I with bed mobility and simple tasks at home like make some coffee or brush his teeth, Step over tub to shower, has bench, but he can't really use it. Up and down the one step to get to back deck.  OBJECTIVE:   DIAGNOSTIC FINDINGS:  MRI 2021 with degenerative changes in lumbar spine, L3-4 R subarticular stenosis  COGNITION: Overall cognitive status: History of cognitive impairments - at baseline   SENSATION: Not tested  COORDINATION: Moderately impaired BLE   MUSCLE LENGTH: Hamstrings: severely restricted B Thomas test: Severely restricted B.  POSTURE: rounded shoulders, forward head, increased thoracic kyphosis, posterior pelvic tilt, and flexed trunk   LOWER EXTREMITY ROM:   BLE extremely stiff throughout, limited hip ROM in all planes, B knees limited in extension.   LOWER EXTREMITY MMT:  3+/5 throughout. Unable to determine accuracy of MMT due to cognition. Functionally demosntrates poor coordinated activation and poor muscular endurance.   BED MOBILITY: Min A for Supine<> Sit. Patient was using a ladder in his hospital bed and could move MI.  TRANSFERS: Min A, mod VC, tends to lean back.  CURB: Min A-Has one step out to his deck.  STAIRS: N/A   GAIT: Gait pattern: step to pattern, decreased arm swing- Right, decreased arm swing- Left, decreased step length- Right, decreased step length- Left, shuffling, festinating, trunk flexed, narrow BOS, poor foot clearance- Right, and poor foot clearance- Left Distance walked: 57' Assistive device utilized: Environmental consultant - 2 wheeled Level of assistance: Occasional min A Comments: mod VC to stay close to walker, take longer steps.  FUNCTIONAL TESTS:  5 times sit to stand: 38.23 Timed up and go (TUG): 47.91  with RW, 07/21/22 TUG 29 seconds without device  TODAY'S TREATMENT:                                                                                                                                DATE:  09/02/22 Nustep level 5 x 5 minutes Walking HHA outside halfway around the parking  island and then rest on table, then back in 2.5# on the left ankle having him sit and raise foot up and abduct to a 6" step on the left Bed mobility rolling side to side Bridges, partial sit ups Walk 300 feet with HHA a lot of cues for the step length Ball kicks Volley ball standing and sitting  08/31/22 Nustep level 5 x 6 mintues On airex single arm 5# pulls Volleyball standing Ball kicks Picking up ball from floor Sitting and standing cone reach and pick up working on balance, weight shift and reach 20# resisted gait front and back Direction changes 4" toe touches Bed mobility getting up from supine Worked on safe sitting turning and feeling the chair on both legs  08/26/22 I met him at the car and walked in the grass and did some curb negotiating and more walking. Seated volley ball 5# straight arm pulls on and off airex Leg press 20# 2x10 Side stepping, fwd and backward walking 2# LAQ 2# marches 2# hip abduction Worked on sitting trunk motions side to side and leaning forward as he was tending to lean to the back today USe of SPC cone toe touches  08/24/22 volleyball standing Standing ball kicks Nustep level 5 x 6 minutes Walk outside partially around [place] and then rest and then back in, the first part of the walk was great, good steps, once he got on grass much more difficult then this was tough to recover from Side stepping, back ward walking Fast walking Picking up cones on the mat table having turn turn, twist and reach, then did from the floor and then did reaching on top of the weight tower Yellow tband ankle exercises Green tband HS curls   08/18/22 UBE level 1 x 4 minutes Bike level 1 x 5 minutes Nustep level 4 x 5 minutes Sit to stand from mat table slightly raised a lot of cues Side stepping Forward and backward walking, direction changes Seated elbow  touches Supine, trunk rotations, partial sit ups, left leg hip abduction with use of sliding board, feet on ball K2C, trunk rotation, bridges and isometric abs Walking 200 feet with cues  08/16/22 Standing hip marches and hip abduction and extension in Pbars with a lot of cues for the movements Stairs step over step with one hand rail and verbal cues to assure clearing the toe UBE level 3 x 5 minutes Leg press with a lot of help getting on and off 20# a lot of cues needed for bigger motions Side stepping, forward and back ward walking with HHA Gait outside down small slope, down and up curb then back with a rest with CGA and a lot of cues Gait outside the front 200 feet to the car  08/11/22 Side stepping in pbars Marches, hip abduction, extension Fast walking Stairs 4" and 6" Nustep level 5 x 6 minutes Sit to stand multiple times and multiple ways On airex head turns, reaching Seated volleyball, standing volleyball Seated ball kicks Gait outside around [place] and to the car   08/09/22 NuStep L5 x 6 minutes Functional status re-assessed for PN-see goals. Balance training-standing on Air ex pad in parallel bars, rotation to L, holding therapist's hand only for balance, up to min A.   PATIENT EDUCATION: Education details: POC Person educated: Patient and Spouse Education method: Explanation Education comprehension: verbalized understanding  HOME EXERCISE PROGRAM: TBD  GOALS: Goals reviewed with patient? Yes  SHORT TERM GOALS: Target date: 07/19/22  I  with initial HEP Baseline: Goal status: ongoing  2. Decrease 5 x STS to < 25 sec to demonstrate improved functional strength  Baseline: 38 sec  Goal status: 08/09/22 14 sec-met  3. Decrease TUG to < 35 sec to demonstrate improved functional balance  Baseline: 47 sec  Goal status: met 07/21/22  4. Perform sit <> stand transfers from average chair height with S and VC to be able to use the swivel chair at his dining  table.  Baseline: Min A  Goal status: 08/09/22-Inconsistently performs  with S from average height. At times requires CGA to cue forward weight shift. ongoing  LONG TERM GOALS: Target date: 09/29/22  Patient will be able to step over his tub at home using grab bars for stability with CGA to access his shower. Baseline: Unable- has bench, but wife reports he steps over then sits. Goal status: ongoing 08/09/22  2.  Decrease 5 x STS to < 18 sec to demonstrate improved functional strength Baseline:  Goal status: 15-2/26/24-met  3.  Decrease TUG to < 25 sec to demonstrate improved functional balance Baseline:  Goal status: 08/09/22  19 sec-met  4.  Patient will perform his bed mobility with MI. Baseline: Min A Goal status: 08/09/22- I as demo on mat-met  5.  Patient will ambulate at least 400' with LRAD, CGA, demonstrate improved upright posture, longer step length. Baseline:  Goal status: 08/09/22-Amb 400', but began to fatigue after about 300' with more flexed posture, L foot shuffling more, unable to maintain safe technique. ongoing  6.  Patient will manage up and down a single step with LRAD, CGA to access his deck at home. Baseline: Unable Goal status: 08/09/22-Up and down 1 step with U rail. ongoing  ASSESSMENT:  CLINICAL IMPRESSION: Patient did better with gait today and we continued to work on the leg in and out of the car.  As he fatigues he will really need a lot of cues and or CGA to slow him down as he will start taking the small shuffling fast steps.  He does great when he comes up to a curb, his gait gets larger steps and intentional  OBJECTIVE IMPAIRMENTS: Abnormal gait, decreased activity tolerance, decreased balance, decreased cognition, decreased coordination, decreased endurance, decreased mobility, difficulty walking, decreased ROM, decreased strength, decreased safety awareness, impaired flexibility, impaired UE functional use, improper body mechanics, and postural  dysfunction.   ACTIVITY LIMITATIONS: carrying, lifting, bending, sitting, standing, squatting, stairs, transfers, bed mobility, bathing, toileting, dressing, hygiene/grooming, and locomotion level  PARTICIPATION LIMITATIONS: meal prep, cleaning, laundry, interpersonal relationship, shopping, and community activity  PERSONAL FACTORS: Age, Past/current experiences, and 3+ comorbidities: CVA, Parkinson's traits, dementia  are also affecting patient's functional outcome.   REHAB POTENTIAL: Good  CLINICAL DECISION MAKING: Evolving/moderate complexity  EVALUATION COMPLEXITY: Moderate  PLAN:  PT FREQUENCY: 2x/week  PT DURATION: 12 weeks  PLANNED INTERVENTIONS: Therapeutic exercises, Therapeutic activity, Neuromuscular re-education, Balance training, Gait training, Patient/Family education, Self Care, Joint mobilization, Stair training, Moist heat, and Manual therapy  PLAN FOR NEXT SESSION: Trunk and extremity mobilization, balance, strength, functional mobility re-education.  Lum Babe, PT 09/02/22 3:45 PM

## 2022-09-04 NOTE — Progress Notes (Signed)
Subjective: Chief Complaint  Patient presents with   routine foot care     82 y.o. returns the office today for painful, elongated, thickened toenails which he cannot trim himself.  His toenails are sensitive.  Denies any drainage or pus from the sternal sites.  No open lesions.  No other concerns today.  He is on Plavix.  PCP: Ann Held, DO Last seen May 28, 2022  Objective: NAD, presents with his wife DP/PT pulses palpable, CRT less than 3 seconds  Nails hypertrophic, dystrophic, elongated, brittle, discolored x 10.  Incurvation of the left hallux toenail without any edema, erythema or signs of infection.  There is tenderness overlying the nails 1-5 bilaterally. There is no edema, erythema or drainage to the toenail sites today.  His hallux toenails are sensitive but has been an ongoing issue.  Is no signs of infection. Hammertoes present No open lesions No pain with calf compression, swelling, warmth, erythema.  Assessment: Patient presents with symptomatic onychomycosis  Plan: -Treatment options including alternatives, risks, complications were discussed -Nails debrided x 10 without any complications or bleeding.  Ingrown toenails been doing well at this time.  Monitor for any signs or symptoms of infection or pain associated with this.  -Continue daily dressing changes for the abrasion.  Monitoring signs or symptoms of infection.  Return in about 3 months (around 12/03/2022).  Celesta Gentile, DPM

## 2022-09-05 ENCOUNTER — Other Ambulatory Visit: Payer: Self-pay | Admitting: Family Medicine

## 2022-09-06 ENCOUNTER — Ambulatory Visit: Payer: PPO | Admitting: Occupational Therapy

## 2022-09-06 ENCOUNTER — Other Ambulatory Visit (HOSPITAL_BASED_OUTPATIENT_CLINIC_OR_DEPARTMENT_OTHER): Payer: Self-pay

## 2022-09-06 DIAGNOSIS — M6281 Muscle weakness (generalized): Secondary | ICD-10-CM

## 2022-09-06 DIAGNOSIS — I69318 Other symptoms and signs involving cognitive functions following cerebral infarction: Secondary | ICD-10-CM

## 2022-09-06 DIAGNOSIS — M25512 Pain in left shoulder: Secondary | ICD-10-CM

## 2022-09-06 DIAGNOSIS — R41840 Attention and concentration deficit: Secondary | ICD-10-CM

## 2022-09-06 DIAGNOSIS — R278 Other lack of coordination: Secondary | ICD-10-CM

## 2022-09-06 DIAGNOSIS — I69354 Hemiplegia and hemiparesis following cerebral infarction affecting left non-dominant side: Secondary | ICD-10-CM

## 2022-09-06 MED ORDER — TRAZODONE HCL 150 MG PO TABS
75.0000 mg | ORAL_TABLET | Freq: Every day | ORAL | 1 refills | Status: DC
  Filled 2022-09-06: qty 20, 40d supply, fill #0
  Filled 2022-10-10: qty 20, 40d supply, fill #1

## 2022-09-06 NOTE — Therapy (Signed)
OUTPATIENT OCCUPATIONAL THERAPY TREATMENT NOTE  Patient Name: William [name] MRN: [id number] DOB:01/17/1941, 82 y.o., male Today's Date: 09/06/2022  PCP & REFERRING PROVIDER:  Carollee Herter, Alferd Apa, DO    END OF SESSION:  OT End of Session - 09/06/22 1551     Visit Number 12    Number of Visits 32    Authorization Type [employer]    Authorization - Visit Number 12    Progress Note Due on Visit 20    OT Start Time 1445    OT Stop Time 1532    OT Time Calculation (min) 47 min    Activity Tolerance Patient tolerated treatment well    Behavior During Therapy Impulsive                    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   HTN (hypertension) 07/19/2015   Hyperlipidemia    Major neurocognitive disorder 01/09/2014   Mild, related to stroke history   Nocturia    Renal insufficiency 06/25/2013   S/P carotid endarterectomy    BILATERAL ICA--  PATENT PER DUPLEX  05-19-2012   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/26/2017   Right calf   Urinary frequency    Vitamin D deficiency    Past Surgical History:  Procedure Laterality Date   APPENDECTOMY  AS CHILD   CARDIOVASCULAR STRESS TEST  03-27-2012  DR CRENSHAW   LOW RISK LEXISCAN STUDY-- PROBABLE NORMAL PERFUSION AND SOFT TISSUE ATTENUATION/  NO ISCHEMIA/ EF 51%   CAROTID ENDARTERECTOMY Bilateral LEFT  11-12-2008  DR GREG HAYES   RIGHT ICA  2006  ([facility])   CHOLECYSTECTOMY N/A 02/23/2022   Procedure: LAPAROSCOPIC CHOLECYSTECTOMY;  Surgeon: Felicie Morn, MD;  Location: [place];   Service: General;  Laterality: N/A;   CYSTOSCOPY W/ RETROGRADES Bilateral 06/22/2021   Procedure: CYSTOSCOPY WITH RETROGRADE PYELOGRAM;  Surgeon: Franchot Gallo, MD;  Location: [place];  Service: Urology;  Laterality: Bilateral;   CYSTOSCOPY WITH LITHOLAPAXY N/A 02/26/2013   Procedure: CYSTOSCOPY WITH LITHOLAPAXY;  Surgeon: Franchot Gallo, MD;  Location: [facility];  Service: Urology;  Laterality: N/A;   ENDOSCOPIC RETROGRADE CHOLANGIOPANCREATOGRAPHY (ERCP) WITH PROPOFOL N/A 02/22/2022   Procedure: ENDOSCOPIC RETROGRADE CHOLANGIOPANCREATOGRAPHY (ERCP) WITH PROPOFOL;  Surgeon: Carol Ada, MD;  Location: [place];  Service: Gastroenterology;  Laterality: N/A;   EYE SURGERY  Jan. 2016   cataract surgery both eyes   INGUINAL HERNIA REPAIR Right 11-08-2006   IR KYPHO EA ADDL LEVEL THORACIC OR LUMBAR  02/12/2021   IR RADIOLOGIST EVAL & MGMT  02/18/2021   MASS EXCISION N/A 03/03/2016   Procedure: EXCISION OF BACK  MASS;  Surgeon: Stark Klein, MD;  Location: [place];  Service: General;  Laterality: N/A;   MOHS SURGERY Left 1/ 2016   Dr Nevada Crane-- Basal cell   PROSTATE SURGERY     REMOVAL OF STONES  02/22/2022   Procedure: REMOVAL OF STONES;  Surgeon: Carol Ada, MD;  Location: [facility]  ENDOSCOPY;  Service: Gastroenterology;;   [name]  02/22/2022   Procedure: [name];  Surgeon: Carol Ada, MD;  Location: [facility] ENDOSCOPY;  Service: Gastroenterology;;   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-C N/A 06/22/2021   Procedure: TRANSURETHRAL RESECTION OF BLADDER TUMOR;  Surgeon: Franchot Gallo, MD;  Location: [facility];  Service: Urology;  Laterality: N/A;   TRANSURETHRAL RESECTION OF PROSTATE N/A 02/26/2013   Procedure: TRANSURETHRAL RESECTION OF THE PROSTATE WITH GYRUS INSTRUMENTS;  Surgeon: Franchot Gallo, MD;  Location: [facility];  Service: Urology;  Laterality: N/A;   TRANSURETHRAL  RESECTION OF PROSTATE N/A 06/22/2021   Procedure: TRANSURETHRAL RESECTION OF THE PROSTATE (TURP);  Surgeon: Franchot Gallo, MD;  Location: [facility];  Service: Urology;  Laterality: N/A;   Patient Active Problem List   Diagnosis Date Noted   Dysuria 08/03/2022   Nonintractable headache 07/01/2022   Bilateral impacted cerumen 06/24/2022   Rash 06/15/2022   Postoperative ileus ([place]) 02/27/2022   Ileus, postoperative ([place]) 02/26/2022   Choledocholithiasis 02/19/2022   DNR (do not resuscitate) 02/19/2022   Left elbow pain 01/26/2022   Mid back pain on left side 01/26/2022   Rib pain 01/26/2022   Acute pain of left shoulder 11/12/2021   Leukocytes in urine 11/12/2021   Urinary frequency 11/12/2021   Thrush 10/08/2021   Hemiplegia, dominant side S/P CVA (cerebrovascular accident) ([place]) 09/11/2021   Insomnia    Prediabetes    Acute 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 ([place])    Basal ganglia infarction ([place]) 07/29/2021   Transaminitis 07/27/2021   UTI (urinary tract infection) 07/27/2021   CVA (cerebral vascular accident) ([place]) 07/27/2021   Fall 07/27/2021   Hyperglycemia 07/27/2021   Cholelithiasis 07/27/2021   Hypoxia 07/27/2021   Nausea and vomiting [id number]   Acute metabolic encephalopathy [id number]   Normocytic anemia 07/27/2021   Chronic back pain 07/27/2021   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[place]) 06/22/2021   Closed fracture of first lumbar vertebra with routine healing 02/03/2021   Closed fracture of multiple ribs 11/18/2020   Anxiety 01/29/2020   Leg pain, bilateral 01/29/2020   Ingrown toenail 07/13/2019   Lumbar spondylosis 05/02/2018   Pain in left knee 03/09/2018   Osteoarthritis of left hip 01/16/2018   Trochanteric bursitis of left hip 01/16/2018   Preventative health care 09/26/2017   HTN (hypertension) 07/19/2015   Hyperlipidemia 07/19/2015   Great toe pain 02/11/2014   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   Obesity (BMI 30-39.9) 06/25/2013   Renal insufficiency 06/25/2013   Weakness of left arm 06/25/2013   Sebaceous cyst 03/03/2011   Sprain of lumbar region 07/31/2010   Rib pain, left 08/29/2009   Carotid artery stenosis, asymptomatic, bilateral 05/02/2009   Eczema, atopic 05/31/2008   Vitamin D deficiency 03/01/2008   BPH (benign prostatic hyperplasia) 08/06/2007   Fasting hyperglycemia 12/21/2006   History of right MCA infarct 2006    ONSET DATE: Acute exacerbation of chronic symptoms (most recent hospitalization 02/26/22 - 03/12/22, followed by SNF and home health)   REFERRING DIAG:  I63.9 (ICD-10-CM) - Cerebrovascular accident (CVA), unspecified mechanism ([place])  W19.XXXD (ICD-10-CM) - Fall, subsequent encounter    THERAPY DIAG:  Muscle weakness (generalized)  Hemiplegia and hemiparesis following cerebral infarction affecting left non-dominant side (HCC)  Other lack of coordination  Acute pain of left shoulder  Attention and concentration deficit  Other symptoms and signs involving cognitive functions following cerebral infarction  Rationale for Evaluation and Treatment:  Rehabilitation  PERTINENT HISTORY: PMHx includes L basal ganglia and corona radiata infarct 07/27/21, L lower homonymous quadrantanopia; R MCA infarct in 2006 w/ L-sided hemiparesis, NCD, carotid artery disease (bilateral), HTN, HLD,CKD3, vascular dementia, and hx of bladder cancer. He has had falls in 2023, fracturing his clavicle in August, then he had stomach surgery in Sept 2023. He has He needs assist at baseline for ADLs. When last documented in acute rehab after having his gall-bladder removed, (03/09/22) he required moderate to maximum assist x2 for most ADLs (more assist for ADLs the require standing), and they recommended rehab in skilled nursing setting. He apparently got Covid at some point and also attended a SNF. Once he D/C home, he had some episodes of  combativeness at night  with "sun-downing."  He is communicative and appropriate, though appears easily confused and distracted, not fully oriented to time and place (Ax2, states date is 78 and cannot say what city he lives in). His wife provides most details, and he is participative, though a poor historian at times. In a nut shell- he has been suffering from dementia and post-stroke effects for years, and his issues were recently exacerbated in Sept of last year after abdominal surgery. He has less use of Lt "stroke side" and new, ritualistic "ticks" in mouth and Rt hand/arm.  Wife states that they do have a caregiver come M, W, F for 4 hours in the middle of the day to mainly help him bathe and dress, which usually tires him out. He has been more combative towards the end of the day as well.   PRECAUTIONS: Fall and Other: Lt hemi, poor cognition, LATEX and adhesive allergies, etc.  ;WEIGHT BEARING RESTRICTIONS: No   SUBJECTIVE:   SUBJECTIVE STATEMENT: Pt reports neck pain today  Pt accompanied by:  his wife  PAIN:  Are you having pain? Yes: NPRS scale: 10/10 per pt report on arrival, 7/10 encd of session Pain location: neck Pain description: aching Aggravating factors: malpositioning Relieving factors: heat  FALLS: Has patient fallen in last 6 months? Yes. Number of falls at least 1 resulting in clavicle fx (now healed)   LIVING ENVIRONMENT: Lives with: lives with their spouse Lives in: House/apartment Has following equipment at home: manual w/c, 2 w/w, weighted utensils, shower bench (for tub), bed cane, hospital bed, raised toilet, and possibly more  PLOF: Needs assistance with ADLs, Needs assistance with homemaking, Needs assistance with gait, and Needs assistance with transfers  PATIENT GOALS: at eval- Top caregiver goals today:  #1- increase personal hygiene skills (brush teeth, shaving) #2- prepare instant coffee or microwave meal #3- improve functional cognitive skills to  play games, etc.,  #4- increase Lt arm usage #5- try to help decrease Rt hand/arm perseveration (flicking, tapping, noise making) #6- Improve fnl tranfers in/out of multiple seats (tub bench, swivel chair, etc.)    OBJECTIVE: (All objective assessments below are from initial evaluation on: 07/07/22 unless otherwise specified.)   HAND DOMINANCE: Right  ADLs: Overall ADLs: Wife states decreased ability since hospitalization in Sept 23.  Transfers/ambulation related to ADLs: Min A transfer, CGA for mobility Eating: set up Grooming: Mod A- needs assist with oral care, shaving, but can comb his hair UB Dressing: Min A  LB Dressing: Dep  Toileting:  Mod  Bathing: Mod  Tub Shower transfers: Min A  Equipment: Radio broadcast assistant, Grab bars, and Feeding equipment  IADLs: completely dependent for years now, except some ability to use microwave which is now not possible  after hospitalization   MOBILITY STATUS: Needs Assist: CGA - Min A, Hx of falls, Festination, difficulty with turns, and neglect on Lt side  POSTURE COMMENTS:  rounded shoulders, forward head, and weight shift left  ACTIVITY TOLERANCE: Activity tolerance: he became tired and somewhat agitated by the end of the OT evaluation today (after PT and OT about 2.5hours with mostly sedate activities in seated positions)  FUNCTIONAL OUTCOME MEASURES: [facility] 17/30  showing problems with visuospatial/executive function, attention, fluency, and orientation. (at least 26/30 is needed to be considered "normal")  [employer] in ADLs: 1/6  (very dependent)  UPPER EXTREMITY ROM:    Active ROM Right eval Left eval  Shoulder scaption 100 85  Shoulder abduction    Shoulder adduction    Shoulder extension    Shoulder internal rotation Reaches small of back Reaches to hip  Shoulder external rotation Reaches back of head  Reaches up to ear  Elbow flexion 150 130  Elbow extension (-10*)  (-35*)  Wrist flexion 35 20  Wrist extension 50  10  (Blank rows = not tested)  UPPER EXTREMITY MMT:    Tested grossly as "push" and "pull": UE push 4-/5, UE pull 4/5  HAND FUNCTION: Grip strength: Right: 30 lbs; Left: 20 lbs  COORDINATION: Finger Nose Finger test: mildly ataxic but fairly accurate  Box and Blocks:  Right 11blocks, Left 5blocks  MUSCLE TONE:  Rt- normal, Lt- decreased and worsened by inattention/disuse  COGNITION: Overall cognitive status: Impaired and Difficulty to assess due to: severity of deficits; Poor attention, fluency, orientation, and executive function. Recall is fairly good as well as abstraction.  Seems to have personality changes due to vascular dementia- like an OCD type volitional perseveration of tapping with Rt hand that he can somewhat control.  He did become angry and agitated at the end of the session performing car transfer with spouse: he did shout at her.  VISION: Subjective report: no new changes per pt/spouse, uses "cheaters"   VISION ASSESSMENT: Visual fields seem intact though he had latent responses on the left side showing likely inattention/and/neglect, tracking somewhat delayed horizontally and vertically  PERCEPTION: Impaired: Inattention/neglect: does not attend to left visual field and does not attend to left side of body, Body scheme: poor quality of recognition with shaving, etc., and Spatial orientation: decreased in print and in orientation, especially to Lt side  PRAXIS: Not tested specifically but no overt/stated issues using pen, wearing glasses ,etc.   OBSERVATIONS: Pt taps and flicks and does seem to volitionally perseverate on making a rhythm/noise with Rt hand. He states he is aware of it and chooses to do it.  He does also seem to have mild resting and intention tremor.    TODAY'S TREATMENT:     DATE: 09/06/22- Standing to perform dynamic functional reaching with right and left UE's, mod v.c and close supervision for balance. Arm bike x 5 mins level 1 for conditioning,  min v.c to maintain LUE grasp Seated at table, removing Wooden dowel pegs from pegboard with LUE, RUE in a pillow case to remind pt to avoid use. Replacing pegs with R and LUE, min-mod difficulty and v.c from therapist for LUE use. Opening containers grasping with LUE while opening and closing with right. Functional reaching out to the side to grasp graded clothespins yellow-blue to place on a target with LUE, min-mod difficulty.  Pt's wife wants to walk him in the ne[place], however she is unsure of the appropriate device. Therapist recommended that pt's  wife discusses with PT tomorrow. Pt may benefit from a U- step walker?     09/01/22- Pt arrived today stating he has neck pain on right side of neck, traps. PT ambulated back to OT treatment room with close supervision/ minguard and min v.c for step length Pt transferred to supine position with min A.  Hotpack was applied to neck x approximately 20 mins (no adverse reactions)while patient performed scapular retraction then closed chain shoulder flexion with ball, with min facilitation v.c Supine functional reaching cross body with left and right UE's to place beanbags in container, min-mod v.c and pt performed head/ neck rotation during task. Funcitonal reaching with right and left UE's for place clothespins on a vertical target at specific heights, min v.c min-mod difficulty. Seated at table, pt worked on [employer] in newspaper and locating specific items, mod difficulty/ v.c and mod facilitation, hot pack reapplied during this task x grossly 10 mins, no adverse reactions. Pt demonstrated improved ability to sustain attention to this task for grossly 6-8 mins without re-direction.  08/30/22- Pt practiced doffing and donning his sweatshirt jacket, mod v.c and facilitation seated on mat Standing at countertop to reach for containers and open/ close lids while holding with one hand and opening with the other, closes supervision/ min  v.c Flipping large playing cards with left and right hands, min v.c and min-mod difficulty for LUE use. Pt attempted to teach therapist how to play a card game however pt became distracted, despite attempts to refocus by therapist. Pt was able to use both hands together to deal cards and to gather the stack of cards. Placing large plastic pegs into pegboard with right and left UE's, mod difficulty/ facilitation for placing pegs with LUE, min difficulty and v.c for removing. Pt became distracted about 30  ins into treatment session and demonstrates difficulty sustaining focus in a minimally distracting environment.  08/25/22- Therapist assisted pt with scooting up to the table, pt's RUE was pinched between table and chair. Pt with small bruise at R thumb IP knuckle. Pt's wife aware. Pt was in no distress after initial pinch.   Pt's wife brought in newspaper, pt practiced flipping pages on tabletop, mod v.c and facilitation. Pt located the sports section with min-mod questioning cues. Therapist added cognitive component by having pt locate certain items in the paper with mod v.c.  Tossing and catching ball with bilateral UE's while seated on the mat, mod v.c for LUE use. Therapist added cognitive component by having pt answer questions or name an item for a specific letter of the alphabet.  Placing/ removing wooden dowel pegs in pegboard with right and left UE's to follow specific verbal instuctions, mod v.c and min-mod difficulty with LUE use. Pt continues to neglect LUE.  Pt left in no distress, stating it was a good session.     PATIENT EDUCATION: Education details: See treatment section above for details,  Person educated: Patient and Spouse Education method: Explanation, Demonstration, Tactile cues, and Verbal cues Education comprehension: verbalized understanding, returned demonstration, verbal cues required, tactile cues required, and needs further education  HOME EXERCISE  PROGRAM: Access Code: [email or web address] URL: [email or web address] Date: 07/21/2022 Prepared by: Benito Mccreedy Exercises - Seated Shoulder Flexion AAROM with Dowel  - 4-6 x daily - 1 sets - 10-15 reps - Seated Shoulder Abduction AAROM with Dowel  - 4-6 x daily - 1 sets - 10-15 reps - Seated Shoulder External Rotation AAROM with Dowel  - 4-6 x daily - 1 sets -  10-15 reps - Seated Bicep Curls with Bar  - 4-6 x daily - 1 sets - 10-15 reps - Seated Shoulder Row with Anchored Resistance  - 4-6 x daily - 1 sets - 10-15 reps     GOALS: Goals reviewed with patient? Yes   SHORT TERM GOALS: (STG required if POC>30 days) Target Date: 09/23/22   Pt will demo/state understanding of initial HEP (or home activity suggestions to maintain function as appropriate) to improve functional skills. Goal status: 07/28/22- Met (addressed in past 2 sessions) 2. Pt will donn a t-shirt consistently with min A and v.c   Goal status: New 3. Pt will demonstrate ability to open the newspaper    with supervision min v.c Goal status: New 4.  Pt will consistently sustain focus to a simple functional task  in a minimally distracting environment x 6 mins with no more than 1 re-direct(ie: eating, functional task in clinic) Baseline:  Goal status: NEW 5. Pt will consistently open containers with only supervision and min v.c           Goal status:New    LONG TERM GOALS: Target Date: 11/16/22  Pt will improve functional ability by decreased impairment per Redmond Baseman assessment from 1/6 to 2/6 or better, for better quality of life. Goal status:  not met 1/6  2.  Caregiver will state his grooming ability/hygiene ability improved from moderate assist to supervision assist. Goal status: not met-Pt continues to require mod A.  3.  He will show better functional use of left arm by improved box and blocks Test score from 5 blocks to at least 10 blocks. Goal status:not met but improved 8 blocks  4.  Pt  will show ability to microwave instant coffee or small meal safely, to help decrease caregiver burden. Goal status: partially met, pt is able to microwave coffee with supervision, however occasional min A to open containers 5.  He will show improved cognitive ability enough to play a simple game with OT and caregiver. Goal status: approximating goal, pt has played a game of uno at home with family.  6.  Caregiver will state at least 4 ways to decrease perseveration with right arm, also stating that this behaviors at least somewhat better in the home now. Goal status: deferred perseverative behaviors have improved.  7. Pt will donn a flannel button front shirt consistently with min A and min v.c Baseline: mod-max Goal status:  ongoing 09/01/22 8.  Pt will use his LUE as an active assist for ADLS grossly [id number] of the time with min v.c Baseline: uses 25% Goal status:  ongoing 09/01/22  9 Pt will stand at the countertop and perform functional activities  incorporating bilateral UE's with supervision and min v.c  Goal status:  ongoing 09/01/22 10. Pt will back up to a chair and sit down squarely at least 1/3 of the time with min v.c  Goal status: ongoing  11..  Pt will open the newspaper and locate sports section with no more than min v.c                          Goal status:  ongoing  ASSESSMENT:  CLINICAL IMPRESSION: Pt is progressing towards goals. Pt was able to sustain attention to  functional peg task for 5-7 mins in a quiet environment. He demonstrates improving ability to square up to sit in the clinic however pt's wife reports he is not carrying over at home.  OT Frequency/DURATION: 2x week x 12 weeks however anticipate d/c after 8 weeks dependent on progress  PLANNED INTERVENTIONS: self care/ADL training, therapeutic exercise, therapeutic activity, neuromuscular re-education, manual therapy, balance training, functional mobility training, moist heat, cryotherapy, patient/family  education, cognitive remediation/compensation, visual/perceptual remediation/compensation, psychosocial skills training, energy conservation, coping strategies training, DME and/or AE instructions, and Re-evaluation  RECOMMENDED OTHER SERVICES: He has already seen PT for balance and functional mobility, he would probably benefit from speech for cognitive issues as well, though OT will be working with cognition somewhat functionally now.  CONSULTED AND AGREED WITH PLAN OF CARE: Patient and family member/caregiver  PLAN FOR NEXT SESSION:   donning doffing pullover shirt., Functional reaching, functional use of bilateral UE's in standing,    Theone Murdoch, OTR/L 3:53 PM 09/06/22

## 2022-09-07 ENCOUNTER — Ambulatory Visit: Payer: PPO | Admitting: Physical Therapy

## 2022-09-07 ENCOUNTER — Ambulatory Visit (HOSPITAL_COMMUNITY): Payer: PPO | Admitting: Psychiatry

## 2022-09-07 ENCOUNTER — Encounter: Payer: Self-pay | Admitting: Physical Therapy

## 2022-09-07 DIAGNOSIS — M25512 Pain in left shoulder: Secondary | ICD-10-CM

## 2022-09-07 DIAGNOSIS — M6281 Muscle weakness (generalized): Secondary | ICD-10-CM

## 2022-09-07 DIAGNOSIS — I69354 Hemiplegia and hemiparesis following cerebral infarction affecting left non-dominant side: Secondary | ICD-10-CM

## 2022-09-07 DIAGNOSIS — R278 Other lack of coordination: Secondary | ICD-10-CM

## 2022-09-07 NOTE — Therapy (Signed)
OUTPATIENT PHYSICAL THERAPY NEURO Treatment   Patient Name: William [name] MRN: [id number] DOB:1941/04/04, 82 y.o., male Today's Date: 09/07/2022  PCP: Lowne Chase, [place] PROVIDER: same  END OF SESSION:  PT End of Session - 09/07/22 1621     Visit Number 18    Date for PT Re-Evaluation 09/29/22    PT Start Time 1614    PT Stop Time 1700    PT Time Calculation (min) 46 min    Equipment Utilized During Treatment Gait belt    Activity Tolerance Patient tolerated treatment well    Behavior During Therapy Impulsive             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   HTN (hypertension) 07/19/2015   Hyperlipidemia    Major neurocognitive disorder 01/09/2014   Mild, related to stroke history   Nocturia    Renal insufficiency 06/25/2013   S/P carotid endarterectomy    BILATERAL ICA--  PATENT PER DUPLEX  05-19-2012   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/26/2017   Right calf   Urinary frequency    Vitamin D deficiency    Past Surgical History:  Procedure Laterality Date   APPENDECTOMY  AS CHILD   CARDIOVASCULAR STRESS TEST  03-27-2012  DR CRENSHAW   LOW RISK LEXISCAN STUDY-- PROBABLE NORMAL PERFUSION AND SOFT TISSUE ATTENUATION/  NO ISCHEMIA/ EF 51%   CAROTID ENDARTERECTOMY Bilateral LEFT  11-12-2008  DR GREG HAYES   RIGHT ICA  2006  ([facility])   CHOLECYSTECTOMY N/A 02/23/2022   Procedure: LAPAROSCOPIC CHOLECYSTECTOMY;  Surgeon: Felicie Morn, MD;  Location: [place];  Service: General;  Laterality: N/A;   CYSTOSCOPY W/ RETROGRADES  Bilateral 06/22/2021   Procedure: CYSTOSCOPY WITH RETROGRADE PYELOGRAM;  Surgeon: Franchot Gallo, MD;  Location: [place];  Service: Urology;  Laterality: Bilateral;   CYSTOSCOPY WITH LITHOLAPAXY N/A 02/26/2013   Procedure: CYSTOSCOPY WITH LITHOLAPAXY;  Surgeon: Franchot Gallo, MD;  Location[facility];  Service: Urology;  Laterality: N/A;   ENDOSCOPIC RETROGRADE CHOLANGIOPANCREATOGRAPHY (ERCP) WITH PROPOFOL N/A 02/22/2022   Procedure: ENDOSCOPIC RETROGRADE CHOLANGIOPANCREATOGRAPHY (ERCP) WITH PROPOFOL;  Surgeon: Carol Ada, MD;  Location: [place];  Service: Gastroenterology;  Laterality: N/A;   EYE SURGERY  Jan. 2016   cataract surgery both eyes   INGUINAL HERNIA REPAIR Right 11-08-2006   IR KYPHO EA ADDL LEVEL THORACIC OR LUMBAR  02/12/2021   IR RADIOLOGIST EVAL & MGMT  02/18/2021   MASS EXCISION N/A 03/03/2016   Procedure: EXCISION OF BACK  MASS;  Surgeon: Stark Klein, MD;  Location: [place];  Service: General;  Laterality: N/A;   MOHS SURGERY Left 1/ 2016   Dr Nevada Crane-- Basal cell   PROSTATE SURGERY     REMOVAL OF STONES  02/22/2022   Procedure: REMOVAL OF STONES;  Surgeon: Carol Ada, MD;  Location: [place];  Service: Gastroenterology;;   [name]  02/22/2022   Procedure: [name];  Surgeon: Carol Ada, MD;  Location: [place];  Service: Gastroenterology;;   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-C N/A 06/22/2021   Procedure: TRANSURETHRAL RESECTION OF BLADDER TUMOR;  Surgeon: Franchot Gallo, MD;  Location: [facility];  Service: Urology;  Laterality: N/A;   TRANSURETHRAL RESECTION OF PROSTATE N/A 02/26/2013   Procedure: TRANSURETHRAL RESECTION OF THE PROSTATE WITH GYRUS INSTRUMENTS;  Surgeon: Franchot Gallo, MD;  Location: [facility];  Service: Urology;  Laterality: N/A;   TRANSURETHRAL RESECTION OF PROSTATE N/A 06/22/2021   Procedure: TRANSURETHRAL  RESECTION OF THE PROSTATE (TURP);  Surgeon: Franchot Gallo, MD;  Location: [facility];  Service: Urology;  Laterality: N/A;   Patient Active Problem List   Diagnosis Date Noted   Dysuria 08/03/2022   Nonintractable headache 07/01/2022   Bilateral impacted cerumen 06/24/2022   Rash 06/15/2022   Postoperative ileus ([place]) 02/27/2022   Ileus, postoperative ([place]) 02/26/2022   Choledocholithiasis 02/19/2022   DNR (do not resuscitate) 02/19/2022   Left elbow pain 01/26/2022   Mid back pain on left side 01/26/2022   Rib pain 01/26/2022   Acute pain of left shoulder 11/12/2021   Leukocytes in urine 11/12/2021   Urinary frequency 11/12/2021   Thrush 10/08/2021   Hemiplegia, dominant side S/P CVA (cerebrovascular accident) ([place]) 09/11/2021   Insomnia    Prediabetes    Acute 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 ([place])    Basal ganglia infarction ([place]) 07/29/2021   Transaminitis 07/27/2021   UTI (urinary tract infection) 07/27/2021   CVA (cerebral vascular accident) ([place]) 07/27/2021   Fall 07/27/2021   Hyperglycemia 07/27/2021   Cholelithiasis 07/27/2021   Hypoxia 07/27/2021   Nausea and vomiting [id number]   Acute metabolic encephalopathy [id number]   Normocytic anemia 07/27/2021   Chronic back pain 07/27/2021   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[place]) 06/22/2021   Closed fracture of first lumbar vertebra with routine healing 02/03/2021   Closed fracture of multiple ribs 11/18/2020   Anxiety 01/29/2020   Leg pain, bilateral 01/29/2020   Ingrown toenail 07/13/2019   Lumbar spondylosis 05/02/2018   Pain in left knee 03/09/2018   Osteoarthritis of left hip 01/16/2018   Trochanteric bursitis of left hip 01/16/2018   Preventative health care 09/26/2017   HTN (hypertension) 07/19/2015   Hyperlipidemia 07/19/2015   Great toe pain 02/11/2014   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   Obesity (BMI 30-39.9) 06/25/2013    Renal insufficiency 06/25/2013   Weakness of left arm 06/25/2013   Sebaceous cyst 03/03/2011   Sprain of lumbar region 07/31/2010   Rib pain, left 08/29/2009   Carotid artery stenosis, asymptomatic, bilateral 05/02/2009   Eczema, atopic 05/31/2008   Vitamin D deficiency 03/01/2008   BPH (benign prostatic hyperplasia) 08/06/2007   Fasting hyperglycemia 12/21/2006   History of right MCA infarct 2006    ONSET DATE: 06/29/22  REFERRING DIAG:  Diagnosis  I63.9 (ICD-10-CM) - Cerebrovascular accident (CVA), unspecified mechanism ([place])  W19.XXXD (ICD-10-CM) - Fall, subsequent encounter    THERAPY DIAG:  Muscle weakness (generalized)  Hemiplegia and hemiparesis following cerebral infarction affecting left non-dominant side (HCC)  Other lack of coordination  Acute pain of left shoulder  Rationale for Evaluation and Treatment: Rehabilitation  SUBJECTIVE:  SUBJECTIVE STATEMENT: No falls, wife asked about walking outside and what might be safe. PERTINENT HISTORY:  CORRON MULLOY is a 82 year old man with dementia, CKD, HTN, CAD, HLD and history of CVA  (2006, 2021), Parkinson's  PAIN:  Are you having pain? No  PRECAUTIONS: Fall  WEIGHT BEARING RESTRICTIONS: No  FALLS: Has patient fallen in last 6 months? No  LIVING ENVIRONMENT: Lives with: lives with their family and lives with their spouse Lives in: House/apartment Stairs:  1 brick high step Has following equipment at home: Environmental consultant - 2 wheeled, Wheelchair (manual), Shower bench, bed side commode, Grab bars, and hospital bed, sliding pad for car  PLOF: Independent with basic ADLs and Independent with household mobility without device  PATIENT GOALS: Walk around the block with his wife. Be able to use the dining room chair rather than have  to use the W/C. Increased I with bed mobility and simple tasks at home like make some coffee or brush his teeth, Step over tub to shower, has bench, but he can't really use it. Up and down the one step to get to back deck.  OBJECTIVE:   DIAGNOSTIC FINDINGS:  MRI 2021 with degenerative changes in lumbar spine, L3-4 R subarticular stenosis  COGNITION: Overall cognitive status: History of cognitive impairments - at baseline   SENSATION: Not tested  COORDINATION: Moderately impaired BLE   MUSCLE LENGTH: Hamstrings: severely restricted B Thomas test: Severely restricted B.  POSTURE: rounded shoulders, forward head, increased thoracic kyphosis, posterior pelvic tilt, and flexed trunk   LOWER EXTREMITY ROM:   BLE extremely stiff throughout, limited hip ROM in all planes, B knees limited in extension.   LOWER EXTREMITY MMT:  3+/5 throughout. Unable to determine accuracy of MMT due to cognition. Functionally demosntrates poor coordinated activation and poor muscular endurance.   BED MOBILITY: Min A for Supine<> Sit. Patient was using a ladder in his hospital bed and could move MI.  TRANSFERS: Min A, mod VC, tends to lean back.  CURB: Min A-Has one step out to his deck.  STAIRS: N/A   GAIT: Gait pattern: step to pattern, decreased arm swing- Right, decreased arm swing- Left, decreased step length- Right, decreased step length- Left, shuffling, festinating, trunk flexed, narrow BOS, poor foot clearance- Right, and poor foot clearance- Left Distance walked: 30' Assistive device utilized: Environmental consultant - 2 wheeled Level of assistance: Occasional min A Comments: mod VC to stay close to walker, take longer steps.  FUNCTIONAL TESTS:  5 times sit to stand: 38.23 Timed up and go (TUG): 47.91  with RW, 07/21/22 TUG 29 seconds without device  TODAY'S TREATMENT:                                                                                                                               DATE:   09/07/22 Nustep level 5 x 6 minutes  Tmill  MPH CGA x 1 minute Gait outside around the parking  island with one rest break Stairs step over step In pbars marching and hip abduction Walking forward and backward in pbars Gait outside in the grass  09/02/22 Nustep level 5 x 5 minutes Walking HHA outside halfway around the parking island and then rest on table, then back in 2.5# on the left ankle having him sit and raise foot up and abduct to a 6" step on the left Bed mobility rolling side to side Bridges, partial sit ups Walk 300 feet with HHA a lot of cues for the step length Ball kicks Volley ball standing and sitting  08/31/22 Nustep level 5 x 6 mintues On airex single arm 5# pulls Volleyball standing Ball kicks Picking up ball from floor Sitting and standing cone reach and pick up working on balance, weight shift and reach 20# resisted gait front and back Direction changes 4" toe touches Bed mobility getting up from supine Worked on safe sitting turning and feeling the chair on both legs  08/26/22 I met him at the car and walked in the grass and did some curb negotiating and more walking. Seated volley ball 5# straight arm pulls on and off airex Leg press 20# 2x10 Side stepping, fwd and backward walking 2# LAQ 2# marches 2# hip abduction Worked on sitting trunk motions side to side and leaning forward as he was tending to lean to the back today USe of SPC cone toe touches  08/24/22 volleyball standing Standing ball kicks Nustep level 5 x 6 minutes Walk outside partially around the island and then rest and then back in, the first part of the walk was great, good steps, once he got on grass much more difficult then this was tough to recover from Side stepping, back ward walking Fast walking Picking up cones on the mat table having turn turn, twist and reach, then did from the floor and then did reaching on top of the weight tower Yellow tband ankle exercises Green  tband HS curls   08/18/22 UBE level 1 x 4 minutes Bike level 1 x 5 minutes Nustep level 4 x 5 minutes Sit to stand from mat table slightly raised a lot of cues Side stepping Forward and backward walking, direction changes Seated elbow touches Supine, trunk rotations, partial sit ups, left leg hip abduction with use of sliding board, feet on ball K2C, trunk rotation, bridges and isometric abs Walking 200 feet with cues  08/16/22 Standing hip marches and hip abduction and extension in Pbars with a lot of cues for the movements Stairs step over step with one hand rail and verbal cues to assure clearing the toe UBE level 3 x 5 minutes Leg press with a lot of help getting on and off 20# a lot of cues needed for bigger motions Side stepping, forward and back ward walking with HHA Gait outside down small slope, down and up curb then back with a rest with CGA and a lot of cues Gait outside the front 200 feet to the car  08/11/22 Side stepping in pbars Marches, hip abduction, extension Fast walking Stairs 4" and 6" Nustep level 5 x 6 minutes Sit to stand multiple times and multiple ways On airex head turns, reaching Seated volleyball, standing volleyball Seated ball kicks Gait outside around [place] and to the car   08/09/22 NuStep L5 x 6 minutes Functional status re-assessed for PN-see goals. Balance training-standing on Air ex pad in parallel bars, rotation to L, holding therapist's hand only for balance, up to min  A.   PATIENT EDUCATION: Education details: POC Person educated: Patient and Spouse Education method: Explanation Education comprehension: verbalized understanding  HOME EXERCISE PROGRAM: TBD  GOALS: Goals reviewed with patient? Yes  SHORT TERM GOALS: Target date: 07/19/22  I with initial HEP Baseline: Goal status: ongoing  2. Decrease 5 x STS to < 25 sec to demonstrate improved functional strength  Baseline: 38 sec  Goal status: 08/09/22 14 sec-met  3.  Decrease TUG to < 35 sec to demonstrate improved functional balance  Baseline: 47 sec  Goal status: met 07/21/22  4. Perform sit <> stand transfers from average chair height with S and VC to be able to use the swivel chair at his dining table.  Baseline: Min A  Goal status: 08/09/22-Inconsistently performs  with S from average height. At times requires CGA to cue forward weight shift. ongoing  LONG TERM GOALS: Target date: 09/29/22  Patient will be able to step over his tub at home using grab bars for stability with CGA to access his shower. Baseline: Unable- has bench, but wife reports he steps over then sits. Goal status: ongoing 08/09/22  2.  Decrease 5 x STS to < 18 sec to demonstrate improved functional strength Baseline:  Goal status: 15-2/26/24-met  3.  Decrease TUG to < 25 sec to demonstrate improved functional balance Baseline:  Goal status: 08/09/22  19 sec-met  4.  Patient will perform his bed mobility with MI. Baseline: Min A Goal status: 08/09/22- I as demo on mat-met  5.  Patient will ambulate at least 400' with LRAD, CGA, demonstrate improved upright posture, longer step length. Baseline:  Goal status: 08/09/22-Amb 400', but began to fatigue after about 300' with more flexed posture, L foot shuffling more, unable to maintain safe technique. ongoing  6.  Patient will manage up and down a single step with LRAD, CGA to access his deck at home. Baseline: Unable Goal status: 08/09/22-Up and down 1 step with U rail. ongoing  ASSESSMENT:  CLINICAL IMPRESSION: Patient with a little more difficulty walking today was more distracted with trying to walk, he did well for about half the walk and then starts the shuffles and leaning forward.  A few times in the pbars was able to get heel strike with cues but seems to fatigue and at times difficult for him to concentrate due to distractions.  Walking at the end of the session in the grass he did well  OBJECTIVE IMPAIRMENTS: Abnormal  gait, decreased activity tolerance, decreased balance, decreased cognition, decreased coordination, decreased endurance, decreased mobility, difficulty walking, decreased ROM, decreased strength, decreased safety awareness, impaired flexibility, impaired UE functional use, improper body mechanics, and postural dysfunction.   ACTIVITY LIMITATIONS: carrying, lifting, bending, sitting, standing, squatting, stairs, transfers, bed mobility, bathing, toileting, dressing, hygiene/grooming, and locomotion level  PARTICIPATION LIMITATIONS: meal prep, cleaning, laundry, interpersonal relationship, shopping, and community activity  PERSONAL FACTORS: Age, Past/current experiences, and 3+ comorbidities: CVA, Parkinson's traits, dementia  are also affecting patient's functional outcome.   REHAB POTENTIAL: Good  CLINICAL DECISION MAKING: Evolving/moderate complexity  EVALUATION COMPLEXITY: Moderate  PLAN:  PT FREQUENCY: 2x/week  PT DURATION: 12 weeks  PLANNED INTERVENTIONS: Therapeutic exercises, Therapeutic activity, Neuromuscular re-education, Balance training, Gait training, Patient/Family education, Self Care, Joint mobilization, Stair training, Moist heat, and Manual therapy  PLAN FOR NEXT SESSION: Trunk and extremity mobilization, balance, strength, functional mobility re-education.  Lum Babe, PT 09/07/22 4:23 PM

## 2022-09-08 DIAGNOSIS — R3914 Feeling of incomplete bladder emptying: Secondary | ICD-10-CM | POA: Diagnosis not present

## 2022-09-08 DIAGNOSIS — N401 Enlarged prostate with lower urinary tract symptoms: Secondary | ICD-10-CM | POA: Diagnosis not present

## 2022-09-09 ENCOUNTER — Ambulatory Visit: Payer: PPO | Admitting: Occupational Therapy

## 2022-09-09 ENCOUNTER — Encounter: Payer: Self-pay | Admitting: Occupational Therapy

## 2022-09-09 DIAGNOSIS — R278 Other lack of coordination: Secondary | ICD-10-CM

## 2022-09-09 DIAGNOSIS — I69318 Other symptoms and signs involving cognitive functions following cerebral infarction: Secondary | ICD-10-CM

## 2022-09-09 DIAGNOSIS — M6281 Muscle weakness (generalized): Secondary | ICD-10-CM

## 2022-09-09 DIAGNOSIS — R2689 Other abnormalities of gait and mobility: Secondary | ICD-10-CM

## 2022-09-09 DIAGNOSIS — R41840 Attention and concentration deficit: Secondary | ICD-10-CM

## 2022-09-09 DIAGNOSIS — I69354 Hemiplegia and hemiparesis following cerebral infarction affecting left non-dominant side: Secondary | ICD-10-CM

## 2022-09-09 DIAGNOSIS — M25512 Pain in left shoulder: Secondary | ICD-10-CM

## 2022-09-09 NOTE — Therapy (Signed)
OUTPATIENT OCCUPATIONAL THERAPY TREATMENT NOTE  Patient Name: William [name] MRN: [id number] DOB:1940-11-24, 82 y.o., male Today's Date: 09/09/2022  PCP & REFERRING PROVIDER:  Carollee Herter, Alferd Apa, DO    END OF SESSION:  OT End of Session - 09/09/22 1505     Visit Number 13    Number of Visits 32    Authorization Type [employer]    Authorization - Visit Number 13    Progress Note Due on Visit 20    OT Start Time 1320    OT Stop Time 1400    OT Time Calculation (min) 40 min    Activity Tolerance Patient tolerated treatment well    Behavior During Therapy Impulsive                    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   HTN (hypertension) 07/19/2015   Hyperlipidemia    Major neurocognitive disorder 01/09/2014   Mild, related to stroke history   Nocturia    Renal insufficiency 06/25/2013   S/P carotid endarterectomy    BILATERAL ICA--  PATENT PER DUPLEX  05-19-2012   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/26/2017   Right calf   Urinary frequency    Vitamin D deficiency    Past Surgical History:  Procedure Laterality Date   APPENDECTOMY  AS CHILD   CARDIOVASCULAR STRESS TEST  03-27-2012  DR CRENSHAW   LOW RISK LEXISCAN STUDY-- PROBABLE NORMAL PERFUSION AND SOFT TISSUE ATTENUATION/  NO ISCHEMIA/ EF 51%   CAROTID ENDARTERECTOMY Bilateral LEFT  11-12-2008  DR GREG HAYES   RIGHT ICA  2006  ([facility])   CHOLECYSTECTOMY N/A 02/23/2022   Procedure: LAPAROSCOPIC CHOLECYSTECTOMY;  Surgeon: Felicie Morn, MD;  Location: [place];   Service: General;  Laterality: N/A;   CYSTOSCOPY W/ RETROGRADES Bilateral 06/22/2021   Procedure: CYSTOSCOPY WITH RETROGRADE PYELOGRAM;  Surgeon: Franchot Gallo, MD;  Location: [place];  Service: Urology;  Laterality: Bilateral;   CYSTOSCOPY WITH LITHOLAPAXY N/A 02/26/2013   Procedure: CYSTOSCOPY WITH LITHOLAPAXY;  Surgeon: Franchot Gallo, MD;  Location: [facility];  Service: Urology;  Laterality: N/A;   ENDOSCOPIC RETROGRADE CHOLANGIOPANCREATOGRAPHY (ERCP) WITH PROPOFOL N/A 02/22/2022   Procedure: ENDOSCOPIC RETROGRADE CHOLANGIOPANCREATOGRAPHY (ERCP) WITH PROPOFOL;  Surgeon: Carol Ada, MD;  Location: [place];  Service: Gastroenterology;  Laterality: N/A;   EYE SURGERY  Jan. 2016   cataract surgery both eyes   INGUINAL HERNIA REPAIR Right 11-08-2006   IR KYPHO EA ADDL LEVEL THORACIC OR LUMBAR  02/12/2021   IR RADIOLOGIST EVAL & MGMT  02/18/2021   MASS EXCISION N/A 03/03/2016   Procedure: EXCISION OF BACK  MASS;  Surgeon: Stark Klein, MD;  Location: [place];  Service: General;  Laterality: N/A;   MOHS SURGERY Left 1/ 2016   Dr Nevada Crane-- Basal cell   PROSTATE SURGERY     REMOVAL OF STONES  02/22/2022   Procedure: REMOVAL OF STONES;  Surgeon: Carol Ada, MD;  Location: [facility]  ENDOSCOPY;  Service: Gastroenterology;;   [name]  02/22/2022   Procedure: [name];  Surgeon: Carol Ada, MD;  Location: [facility] ENDOSCOPY;  Service: Gastroenterology;;   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-C N/A 06/22/2021   Procedure: TRANSURETHRAL RESECTION OF BLADDER TUMOR;  Surgeon: Franchot Gallo, MD;  Location: [facility];  Service: Urology;  Laterality: N/A;   TRANSURETHRAL RESECTION OF PROSTATE N/A 02/26/2013   Procedure: TRANSURETHRAL RESECTION OF THE PROSTATE WITH GYRUS INSTRUMENTS;  Surgeon: Franchot Gallo, MD;  Location: [facility];  Service: Urology;  Laterality: N/A;   TRANSURETHRAL  RESECTION OF PROSTATE N/A 06/22/2021   Procedure: TRANSURETHRAL RESECTION OF THE PROSTATE (TURP);  Surgeon: Franchot Gallo, MD;  Location: [facility];  Service: Urology;  Laterality: N/A;   Patient Active Problem List   Diagnosis Date Noted   Dysuria 08/03/2022   Nonintractable headache 07/01/2022   Bilateral impacted cerumen 06/24/2022   Rash 06/15/2022   Postoperative ileus ([place]) 02/27/2022   Ileus, postoperative ([place]) 02/26/2022   Choledocholithiasis 02/19/2022   DNR (do not resuscitate) 02/19/2022   Left elbow pain 01/26/2022   Mid back pain on left side 01/26/2022   Rib pain 01/26/2022   Acute pain of left shoulder 11/12/2021   Leukocytes in urine 11/12/2021   Urinary frequency 11/12/2021   Thrush 10/08/2021   Hemiplegia, dominant side S/P CVA (cerebrovascular accident) ([place]) 09/11/2021   Insomnia    Prediabetes    Acute 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 ([place])    Basal ganglia infarction ([place]) 07/29/2021   Transaminitis 07/27/2021   UTI (urinary tract infection) 07/27/2021   CVA (cerebral vascular accident) ([place]) 07/27/2021   Fall 07/27/2021   Hyperglycemia 07/27/2021   Cholelithiasis 07/27/2021   Hypoxia 07/27/2021   Nausea and vomiting [id number]   Acute metabolic encephalopathy [id number]   Normocytic anemia 07/27/2021   Chronic back pain 07/27/2021   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[place]) 06/22/2021   Closed fracture of first lumbar vertebra with routine healing 02/03/2021   Closed fracture of multiple ribs 11/18/2020   Anxiety 01/29/2020   Leg pain, bilateral 01/29/2020   Ingrown toenail 07/13/2019   Lumbar spondylosis 05/02/2018   Pain in left knee 03/09/2018   Osteoarthritis of left hip 01/16/2018   Trochanteric bursitis of left hip 01/16/2018   Preventative health care 09/26/2017   HTN (hypertension) 07/19/2015   Hyperlipidemia 07/19/2015   Great toe pain 02/11/2014   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   Obesity (BMI 30-39.9) 06/25/2013   Renal insufficiency 06/25/2013   Weakness of left arm 06/25/2013   Sebaceous cyst 03/03/2011   Sprain of lumbar region 07/31/2010   Rib pain, left 08/29/2009   Carotid artery stenosis, asymptomatic, bilateral 05/02/2009   Eczema, atopic 05/31/2008   Vitamin D deficiency 03/01/2008   BPH (benign prostatic hyperplasia) 08/06/2007   Fasting hyperglycemia 12/21/2006   History of right MCA infarct 2006    ONSET DATE: Acute exacerbation of chronic symptoms (most recent hospitalization 02/26/22 - 03/12/22, followed by SNF and home health)   REFERRING DIAG:  I63.9 (ICD-10-CM) - Cerebrovascular accident (CVA), unspecified mechanism ([place])  W19.XXXD (ICD-10-CM) - Fall, subsequent encounter    THERAPY DIAG:  Muscle weakness (generalized)  Hemiplegia and hemiparesis following cerebral infarction affecting left non-dominant side (HCC)  Other lack of coordination  Acute pain of left shoulder  Attention and concentration deficit  Other symptoms and signs involving cognitive functions following cerebral infarction  Other abnormalities of gait and  mobility  Rationale for Evaluation and Treatment: Rehabilitation  PERTINENT HISTORY: PMHx includes L basal ganglia and corona radiata infarct 07/27/21, L lower homonymous quadrantanopia; R MCA infarct in 2006 w/ L-sided hemiparesis, NCD, carotid artery disease (bilateral), HTN, HLD,CKD3, vascular dementia, and hx of bladder cancer. He has had falls in 2023, fracturing his clavicle in August, then he had stomach surgery in Sept 2023. He has He needs assist at baseline for ADLs. When last documented in acute rehab after having his gall-bladder removed, (03/09/22) he required moderate to maximum assist x2 for most ADLs (more assist for ADLs the require standing), and they recommended rehab in skilled nursing setting. He apparently got Covid at some point and also attended a SNF. Once he  D/C home, he had some episodes of combativeness at night  with "sun-downing."  He is communicative and appropriate, though appears easily confused and distracted, not fully oriented to time and place (Ax2, states date is 79 and cannot say what city he lives in). His wife provides most details, and he is participative, though a poor historian at times. In a nut shell- he has been suffering from dementia and post-stroke effects for years, and his issues were recently exacerbated in Sept of last year after abdominal surgery. He has less use of Lt "stroke side" and new, ritualistic "ticks" in mouth and Rt hand/arm.  Wife states that they do have a caregiver come M, W, F for 4 hours in the middle of the day to mainly help him bathe and dress, which usually tires him out. He has been more combative towards the end of the day as well.   PRECAUTIONS: Fall and Other: Lt hemi, poor cognition, LATEX and adhesive allergies, etc.  ;WEIGHT BEARING RESTRICTIONS: No   SUBJECTIVE:   SUBJECTIVE STATEMENT: Pt reports neck pain today  Pt accompanied by:  his wife  PAIN:  Are you having pain? Yes: NPRS scale:6/10 on arrival, 4/10 end of session Pain location: neck Pain description: aching Aggravating factors: malpositioning Relieving factors: heat  FALLS: Has patient fallen in last 6 months? Yes. Number of falls at least 1 resulting in clavicle fx (now healed)   LIVING ENVIRONMENT: Lives with: lives with their spouse Lives in: House/apartment Has following equipment at home: manual w/c, 2 w/w, weighted utensils, shower bench (for tub), bed cane, hospital bed, raised toilet, and possibly more  PLOF: Needs assistance with ADLs, Needs assistance with homemaking, Needs assistance with gait, and Needs assistance with transfers  PATIENT GOALS: at eval- Top caregiver goals today:  #1- increase personal hygiene skills (brush teeth, shaving) #2- prepare instant coffee or microwave meal #3- improve functional  cognitive skills to play games, etc.,  #4- increase Lt arm usage #5- try to help decrease Rt hand/arm perseveration (flicking, tapping, noise making) #6- Improve fnl tranfers in/out of multiple seats (tub bench, swivel chair, etc.)    OBJECTIVE: (All objective assessments below are from initial evaluation on: 07/07/22 unless otherwise specified.)   HAND DOMINANCE: Right  ADLs: Overall ADLs: Wife states decreased ability since hospitalization in Sept 23.  Transfers/ambulation related to ADLs: Min A transfer, CGA for mobility Eating: set up Grooming: Mod A- needs assist with oral care, shaving, but can comb his hair UB Dressing: Min A  LB Dressing: Dep  Toileting:  Mod  Bathing: Mod  Tub Shower transfers: Min A  Equipment: Radio broadcast assistant, Grab bars, and Feeding equipment  IADLs: completely dependent for years now, except some ability to use microwave which is  now not possible after hospitalization   MOBILITY STATUS: Needs Assist: CGA - Min A, Hx of falls, Festination, difficulty with turns, and neglect on Lt side  POSTURE COMMENTS:  rounded shoulders, forward head, and weight shift left  ACTIVITY TOLERANCE: Activity tolerance: he became tired and somewhat agitated by the end of the OT evaluation today (after PT and OT about 2.5hours with mostly sedate activities in seated positions)  FUNCTIONAL OUTCOME MEASURES: [facility] 17/30  showing problems with visuospatial/executive function, attention, fluency, and orientation. (at least 26/30 is needed to be considered "normal")  [employer] in ADLs: 1/6  (very dependent)  UPPER EXTREMITY ROM:    Active ROM Right eval Left eval  Shoulder scaption 100 85  Shoulder abduction    Shoulder adduction    Shoulder extension    Shoulder internal rotation Reaches small of back Reaches to hip  Shoulder external rotation Reaches back of head  Reaches up to ear  Elbow flexion 150 130  Elbow extension (-10*)  (-35*)  Wrist flexion 35 20   Wrist extension 50 10  (Blank rows = not tested)  UPPER EXTREMITY MMT:    Tested grossly as "push" and "pull": UE push 4-/5, UE pull 4/5  HAND FUNCTION: Grip strength: Right: 30 lbs; Left: 20 lbs  COORDINATION: Finger Nose Finger test: mildly ataxic but fairly accurate  Box and Blocks:  Right 11blocks, Left 5blocks  MUSCLE TONE:  Rt- normal, Lt- decreased and worsened by inattention/disuse  COGNITION: Overall cognitive status: Impaired and Difficulty to assess due to: severity of deficits; Poor attention, fluency, orientation, and executive function. Recall is fairly good as well as abstraction.  Seems to have personality changes due to vascular dementia- like an OCD type volitional perseveration of tapping with Rt hand that he can somewhat control.  He did become angry and agitated at the end of the session performing car transfer with spouse: he did shout at her.  VISION: Subjective report: no new changes per pt/spouse, uses "cheaters"   VISION ASSESSMENT: Visual fields seem intact though he had latent responses on the left side showing likely inattention/and/neglect, tracking somewhat delayed horizontally and vertically  PERCEPTION: Impaired: Inattention/neglect: does not attend to left visual field and does not attend to left side of body, Body scheme: poor quality of recognition with shaving, etc., and Spatial orientation: decreased in print and in orientation, especially to Lt side  PRAXIS: Not tested specifically but no overt/stated issues using pen, wearing glasses ,etc.   OBSERVATIONS: Pt taps and flicks and does seem to volitionally perseverate on making a rhythm/noise with Rt hand. He states he is aware of it and chooses to do it.  He does also seem to have mild resting and intention tremor.    TODAY'S TREATMENT:     DATE: 09/09/22- Pt reports neck pain, hotpack applied to neck x approx 20 mins while performing activities in supine no adverse reactions to heat. Scapular  retraction with gentle passive stretch to bilateral shoulders in retraction. Supine closed chain chest press and shoulder flexion, min-mod facilitation/ v.c Followed by functional reaching with trunk rotation to grasp/ release and reach for cones and hand to therapist with right and left UE's, min-mod v.c Pt demonstrates good functional reach with LUE. Seated edge of mat rocking forwards x 5 reps rpior to sit to stand, min v.c  Reading newspaper and flipping pages at table, min v.c, improved performance flipping sections of newpaper today. Pain decreased to a 4/10 end of session.  09/06/22- Standing  to perform dynamic functional reaching with right and left UE's, mod v.c and close supervision for balance. Arm bike x 5 mins level 1 for conditioning, min v.c to maintain LUE grasp Seated at table, removing Wooden dowel pegs from pegboard with LUE, RUE in a pillow case to remind pt to avoid use. Replacing pegs with R and LUE, min-mod difficulty and v.c from therapist for LUE use. Opening containers grasping with LUE while opening and closing with right. Functional reaching out to the side to grasp graded clothespins yellow-blue to place on a target with LUE, min-mod difficulty.  Pt's wife wants to walk him in [place], however she is unsure of the appropriate device. Therapist recommended that pt's wife discusses with PT tomorrow. Pt may benefit from a U- step walker?     09/01/22- Pt arrived today stating he has neck pain on right side of neck, traps. PT ambulated back to OT treatment room with close supervision/ minguard and min v.c for step length Pt transferred to supine position with min A.  Hotpack was applied to neck x approximately 20 mins (no adverse reactions)while patient performed scapular retraction then closed chain shoulder flexion with ball, with min facilitation v.c Supine functional reaching cross body with left and right UE's to place beanbags in container, min-mod v.c and  pt performed head/ neck rotation during task. Funcitonal reaching with right and left UE's for place clothespins on a vertical target at specific heights, min v.c min-mod difficulty. Seated at table, pt worked on [employer] in newspaper and locating specific items, mod difficulty/ v.c and mod facilitation, hot pack reapplied during this task x grossly 10 mins, no adverse reactions. Pt demonstrated improved ability to sustain attention to this task for grossly 6-8 mins without re-direction.   PATIENT EDUCATION: Education details: See treatment section above for details,  Person educated: Patient and Spouse Education method: Explanation, Demonstration, Tactile cues, and Verbal cues Education comprehension: verbalized understanding, returned demonstration, verbal cues required, tactile cues required, and needs further education  HOME EXERCISE PROGRAM: Access Code: [email or web address] URL: [email or web address] Date: 07/21/2022 Prepared by: Benito Mccreedy Exercises - Seated Shoulder Flexion AAROM with Dowel  - 4-6 x daily - 1 sets - 10-15 reps - Seated Shoulder Abduction AAROM with Dowel  - 4-6 x daily - 1 sets - 10-15 reps - Seated Shoulder External Rotation AAROM with Dowel  - 4-6 x daily - 1 sets - 10-15 reps - Seated Bicep Curls with Bar  - 4-6 x daily - 1 sets - 10-15 reps - Seated Shoulder Row with Anchored Resistance  - 4-6 x daily - 1 sets - 10-15 reps     GOALS: Goals reviewed with patient? Yes   SHORT TERM GOALS: (STG required if POC>30 days) Target Date: 09/23/22   Pt will demo/state understanding of initial HEP (or home activity suggestions to maintain function as appropriate) to improve functional skills. Goal status: 07/28/22- Met (addressed in past 2 sessions) 2. Pt will donn a t-shirt consistently with min A and v.c   Goal status: New 3. Pt will demonstrate ability to open the newspaper    with supervision min v.c Goal status: New 4.  Pt will consistently  sustain focus to a simple functional task  in a minimally distracting environment x 6 mins with no more than 1 re-direct(ie: eating, functional task in clinic) Baseline:  Goal status: NEW 5. Pt will consistently open containers with only supervision and min v.c  Goal status:New    LONG TERM GOALS: Target Date: 11/16/22  Pt will improve functional ability by decreased impairment per [place]mond Baseman assessment from 1/6 to 2/6 or better, for better quality of life. Goal status:  not met 1/6  2.  Caregiver will state his grooming ability/hygiene ability improved from moderate assist to supervision assist. Goal status: not met-Pt continues to require mod A.  3.  He will show better functional use of left arm by improved box and blocks Test score from 5 blocks to at least 10 blocks. Goal status:not met but improved 8 blocks  4.  Pt will show ability to microwave instant coffee or small meal safely, to help decrease caregiver burden. Goal status: partially met, pt is able to microwave coffee with supervision, however occasional min A to open containers 5.  He will show improved cognitive ability enough to play a simple game with OT and caregiver. Goal status: approximating goal, pt has played a game of uno at home with family.  6.  Caregiver will state at least 4 ways to decrease perseveration with right arm, also stating that this behaviors at least somewhat better in the home now. Goal status: deferred perseverative behaviors have improved.  7. Pt will donn a flannel button front shirt consistently with min A and min v.c Baseline: mod-max Goal status:  ongoing 09/01/22 8.  Pt will use his LUE as an active assist for ADLS grossly [id number] of the time with min v.c Baseline: uses 25% Goal status:  ongoing 09/01/22  9 Pt will stand at the countertop and perform functional activities  incorporating bilateral UE's with supervision and min v.c  Goal status:  ongoing 09/01/22 10. Pt will  back up to a chair and sit down squarely at least 1/3 of the time with min v.c  Goal status: ongoing  11..  Pt will open the newspaper and locate sports section with no more than min v.c                          Goal status:  ongoing  ASSESSMENT:  CLINICAL IMPRESSION: Pt is progressing towards goals. Pt demonstrated improved functional reach with LUE, and pain in neck decreased by end of session. OT Frequency/DURATION: 2x week x 12 weeks however anticipate d/c after 8 weeks dependent on progress  PLANNED INTERVENTIONS: self care/ADL training, therapeutic exercise, therapeutic activity, neuromuscular re-education, manual therapy, balance training, functional mobility training, moist heat, cryotherapy, patient/family education, cognitive remediation/compensation, visual/perceptual remediation/compensation, psychosocial skills training, energy conservation, coping strategies training, DME and/or AE instructions, and Re-evaluation  RECOMMENDED OTHER SERVICES: He has already seen PT for balance and functional mobility, he would probably benefit from speech for cognitive issues as well, though OT will be working with cognition somewhat functionally now.  CONSULTED AND AGREED WITH PLAN OF CARE: Patient and family member/caregiver  PLAN FOR NEXT SESSION:   donning doffing pullover shirt., Functional reaching, functional use of bilateral UE's in standing,    [employer], OTR/L 3:07 PM 09/09/22

## 2022-09-13 ENCOUNTER — Ambulatory Visit: Payer: PPO | Attending: Family Medicine | Admitting: Occupational Therapy

## 2022-09-13 ENCOUNTER — Other Ambulatory Visit (HOSPITAL_BASED_OUTPATIENT_CLINIC_OR_DEPARTMENT_OTHER): Payer: Self-pay

## 2022-09-13 ENCOUNTER — Other Ambulatory Visit: Payer: Self-pay | Admitting: Family Medicine

## 2022-09-13 ENCOUNTER — Other Ambulatory Visit: Payer: Self-pay

## 2022-09-13 DIAGNOSIS — Z9181 History of falling: Secondary | ICD-10-CM | POA: Insufficient documentation

## 2022-09-13 DIAGNOSIS — R2689 Other abnormalities of gait and mobility: Secondary | ICD-10-CM | POA: Insufficient documentation

## 2022-09-13 DIAGNOSIS — I69354 Hemiplegia and hemiparesis following cerebral infarction affecting left non-dominant side: Secondary | ICD-10-CM | POA: Diagnosis not present

## 2022-09-13 DIAGNOSIS — R41840 Attention and concentration deficit: Secondary | ICD-10-CM | POA: Diagnosis not present

## 2022-09-13 DIAGNOSIS — I69318 Other symptoms and signs involving cognitive functions following cerebral infarction: Secondary | ICD-10-CM | POA: Diagnosis not present

## 2022-09-13 DIAGNOSIS — M25512 Pain in left shoulder: Secondary | ICD-10-CM | POA: Diagnosis not present

## 2022-09-13 DIAGNOSIS — M6281 Muscle weakness (generalized): Secondary | ICD-10-CM | POA: Diagnosis not present

## 2022-09-13 DIAGNOSIS — R296 Repeated falls: Secondary | ICD-10-CM | POA: Diagnosis not present

## 2022-09-13 DIAGNOSIS — R262 Difficulty in walking, not elsewhere classified: Secondary | ICD-10-CM | POA: Insufficient documentation

## 2022-09-13 DIAGNOSIS — R278 Other lack of coordination: Secondary | ICD-10-CM | POA: Insufficient documentation

## 2022-09-13 MED ORDER — CLOPIDOGREL BISULFATE 75 MG PO TABS
75.0000 mg | ORAL_TABLET | Freq: Every day | ORAL | 1 refills | Status: DC
  Filled 2022-09-13: qty 90, 90d supply, fill #0
  Filled 2022-12-12: qty 90, 90d supply, fill #1

## 2022-09-13 NOTE — Therapy (Signed)
OUTPATIENT OCCUPATIONAL THERAPY TREATMENT NOTE  Patient Name: William [name] MRN: [id number] DOB:1941/04/09, 82 y.o., male Today's Date: 09/13/2022  PCP & REFERRING PROVIDER:  Carollee Herter, Alferd Apa, DO    END OF SESSION:  OT End of Session - 09/13/22 1606     Visit Number 14    Number of Visits 32    Date for OT Re-Evaluation 11/16/22    Authorization Type [employer]    Authorization - Visit Number 14    Progress Note Due on Visit 20    OT Start Time 1446    OT Stop Time 1549    OT Time Calculation (min) 63 min                     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   HTN (hypertension) 07/19/2015   Hyperlipidemia    Major neurocognitive disorder 01/09/2014   Mild, related to stroke history   Nocturia    Renal insufficiency 06/25/2013   S/P carotid endarterectomy    BILATERAL ICA--  PATENT PER DUPLEX  05-19-2012   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   Right calf   Urinary frequency    Vitamin D deficiency    Past Surgical History:  Procedure Laterality Date   APPENDECTOMY  AS CHILD   CARDIOVASCULAR STRESS TEST  03-27-2012  DR CRENSHAW   LOW RISK LEXISCAN STUDY-- PROBABLE NORMAL PERFUSION AND SOFT TISSUE ATTENUATION/  NO ISCHEMIA/ EF 51%   CAROTID ENDARTERECTOMY Bilateral LEFT  11-12-2008  DR GREG HAYES   RIGHT ICA  2006  ([facility])   CHOLECYSTECTOMY N/A 02/23/2022   Procedure: LAPAROSCOPIC CHOLECYSTECTOMY;  Surgeon: Felicie Morn, MD;  Location: [place];  Service: General;  Laterality: N/A;   CYSTOSCOPY W/  RETROGRADES Bilateral 06/22/2021   Procedure: CYSTOSCOPY WITH RETROGRADE PYELOGRAM;  Surgeon: Franchot Gallo, MD;  Location: [place];  Service: Urology;  Laterality: Bilateral;   CYSTOSCOPY WITH LITHOLAPAXY N/A 02/26/2013   Procedure: CYSTOSCOPY WITH LITHOLAPAXY;  Surgeon: Franchot Gallo, MD;  Location: [facility];  Service: Urology;  Laterality: N/A;   ENDOSCOPIC RETROGRADE CHOLANGIOPANCREATOGRAPHY (ERCP) WITH PROPOFOL N/A 02/22/2022   Procedure: ENDOSCOPIC RETROGRADE CHOLANGIOPANCREATOGRAPHY (ERCP) WITH PROPOFOL;  Surgeon: Carol Ada, MD;  Location: [place];  Service: Gastroenterology;  Laterality: N/A;   EYE SURGERY  Jan. 2016   cataract surgery both eyes   INGUINAL HERNIA REPAIR Right 11-08-2006   IR KYPHO EA ADDL LEVEL THORACIC OR LUMBAR  02/12/2021   IR RADIOLOGIST EVAL & MGMT  02/18/2021   MASS EXCISION N/A 03/03/2016   Procedure: EXCISION OF BACK  MASS;  Surgeon: Stark Klein, MD;  Location: [place];  Service: General;  Laterality: N/A;   MOHS SURGERY Left 1/ 2016   Dr Nevada Crane-- Basal cell   PROSTATE SURGERY     REMOVAL OF STONES  02/22/2022   Procedure: REMOVAL OF STONES;  Surgeon: Carol Ada, MD;  Location: [place];  Service: Gastroenterology;;   [name]  02/22/2022   Procedure: SPHINCTEROTOMY;  Surgeon: Carol Ada, MD;  Location: [facility] ENDOSCOPY;  Service: Gastroenterology;;   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-C N/A 06/22/2021   Procedure: TRANSURETHRAL RESECTION OF BLADDER TUMOR;  Surgeon: Franchot Gallo, MD;  Location: [facility];  Service: Urology;  Laterality: N/A;   TRANSURETHRAL RESECTION OF PROSTATE N/A 02/26/2013   Procedure: TRANSURETHRAL RESECTION OF THE PROSTATE WITH GYRUS INSTRUMENTS;  Surgeon: Franchot Gallo, MD;  Location: [facility];  Service: Urology;  Laterality: N/A;   TRANSURETHRAL RESECTION OF PROSTATE N/A 06/22/2021   Procedure:  TRANSURETHRAL RESECTION OF THE PROSTATE (TURP);  Surgeon: Franchot Gallo, MD;  Location: [facility];  Service: Urology;  Laterality: N/A;   Patient Active Problem List   Diagnosis Date Noted   Dysuria 08/03/2022   Nonintractable headache 07/01/2022   Bilateral impacted cerumen 06/24/2022   Rash 06/15/2022   Postoperative ileus 02/27/2022   Ileus, postoperative 02/26/2022   Choledocholithiasis 02/19/2022   DNR (do not resuscitate) 02/19/2022   Left elbow pain 01/26/2022   Mid back pain on left side 01/26/2022   Rib pain 01/26/2022   Acute pain of left shoulder 11/12/2021   Leukocytes in urine 11/12/2021   Urinary frequency 11/12/2021   Thrush 10/08/2021   Hemiplegia, dominant side S/P CVA (cerebrovascular accident) 09/11/2021   Insomnia    Prediabetes    Acute 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    Basal ganglia infarction 07/29/2021   Transaminitis 07/27/2021   UTI (urinary tract infection) 07/27/2021   CVA (cerebral vascular accident) 07/27/2021   Fall 07/27/2021   Hyperglycemia 07/27/2021   Cholelithiasis 07/27/2021   Hypoxia 07/27/2021   Nausea and vomiting [id number]   Acute metabolic encephalopathy 123XX123   Normocytic anemia 07/27/2021   Chronic back pain 07/27/2021   Malignant neoplasm of overlapping sites of bladder 06/22/2021   Closed fracture of first lumbar vertebra with routine healing 02/03/2021   Closed fracture of multiple ribs 11/18/2020   Anxiety 01/29/2020   Leg pain, bilateral 01/29/2020   Ingrown toenail 07/13/2019   Lumbar spondylosis 05/02/2018   Pain in left knee 03/09/2018   Osteoarthritis of left hip 01/16/2018   Trochanteric bursitis of left hip 01/16/2018   Preventative health care 09/26/2017   HTN (hypertension) 07/19/2015   Hyperlipidemia 07/19/2015   Great toe pain 02/11/2014   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9 (ICD-10-CM) - Cerebrovascular accident (CVA), unspecified mechanism ([place])  W19.XXXD (ICD-10-CM) - Fall, subsequent encounter    THERAPY DIAG:  Muscle weakness (generalized)  Hemiplegia and hemiparesis following cerebral infarction affecting left non-dominant side  Other lack of coordination  Acute pain of left shoulder  Attention and concentration deficit  Other symptoms and signs involving cognitive functions following cerebral infarction  Other abnormalities of gait and mobility  Rationale for Evaluation and Treatment: Rehabilitation  PERTINENT HISTORY: PMHx includes L basal ganglia  and corona radiata infarct 07/27/21, L lower homonymous quadrantanopia; R MCA infarct in 2006 w/ L-sided hemiparesis, NCD, carotid artery disease (bilateral), HTN, HLD,CKD3, vascular dementia, and hx of bladder cancer. He has had falls in 2023, fracturing his clavicle in August, then he had stomach surgery in Sept 2023. He has He needs assist at baseline for ADLs. When last documented in acute rehab after having his gall-bladder removed, (03/09/22) he required moderate to maximum assist x2 for most ADLs (more assist for ADLs the require standing), and they recommended rehab in skilled nursing setting. He apparently got Covid at some point and also attended a SNF. Once he D/C home, he had some episodes of combativeness at night  with "sun-downing."  He is communicative  and appropriate, though appears easily confused and distracted, not fully oriented to time and place (Ax2, states date is 31 and cannot say what city he lives in). His wife provides most details, and he is participative, though a poor historian at times. In a nut shell- he has been suffering from dementia and post-stroke effects for years, and his issues were recently exacerbated in Sept of last year after abdominal surgery. He has less use of Lt "stroke side" and new, ritualistic "ticks" in mouth and Rt hand/arm.  Wife states that they do have a caregiver come M, W, F for 4 hours in the middle of the day to mainly help him bathe and dress, which usually tires him out. He has been more combative towards the end of the day as well.   PRECAUTIONS: Fall and Other: Lt hemi, poor cognition, LATEX and adhesive allergies, etc.  ;WEIGHT BEARING RESTRICTIONS: No   SUBJECTIVE:   SUBJECTIVE STATEMENT: No reports of pain today  Pt accompanied by:  his wife  PAIN:  Are you having pain? Yes: NPRS scale:6/10 on arrival, 4/10 end of session Pain location: neck Pain description: aching Aggravating factors: malpositioning Relieving factors: heat  FALLS: Has patient fallen in last 6 months? Yes. Number of falls at least 1 resulting in clavicle fx (now healed)   LIVING ENVIRONMENT: Lives with: lives with their spouse Lives in: House/apartment Has following equipment at home: manual w/c, 2 w/w, weighted utensils, shower bench (for tub), bed cane, hospital bed, raised toilet, and possibly more  PLOF: Needs assistance with ADLs, Needs assistance with homemaking, Needs assistance with gait, and Needs assistance with transfers  PATIENT GOALS: at eval- Top caregiver goals today:  #1- increase personal hygiene skills (brush teeth, shaving) #2- prepare instant coffee or microwave meal #3- improve functional cognitive skills to play games, etc.,  #4- increase Lt arm usage #5- try to help decrease Rt hand/arm  perseveration (flicking, tapping, noise making) #6- Improve fnl tranfers in/out of multiple seats (tub bench, swivel chair, etc.)    OBJECTIVE: (All objective assessments below are from initial evaluation on: 07/07/22 unless otherwise specified.)   HAND DOMINANCE: Right  ADLs: Overall ADLs: Wife states decreased ability since hospitalization in Sept 23.  Transfers/ambulation related to ADLs: Min A transfer, CGA for mobility Eating: set up Grooming: Mod A- needs assist with oral care, shaving, but can comb his hair UB Dressing: Min A  LB Dressing: Dep  Toileting:  Mod  Bathing: Mod  Tub Shower transfers: Min A  Equipment: Radio broadcast assistant, Grab bars, and Feeding equipment  IADLs: completely dependent for years now, except some ability to use microwave which is now not possible after hospitalization   MOBILITY STATUS: Needs Assist: CGA - Min A, Hx  of falls, Festination, difficulty with turns, and neglect on Lt side  POSTURE COMMENTS:  rounded shoulders, forward head, and weight shift left  ACTIVITY TOLERANCE: Activity tolerance: he became tired and somewhat agitated by the end of the OT evaluation today (after PT and OT about 2.5hours with mostly sedate activities in seated positions)  FUNCTIONAL OUTCOME MEASURES: [facility] 17/30  showing problems with visuospatial/executive function, attention, fluency, and orientation. (at least 26/30 is needed to be considered "normal")  [employer] in ADLs: 1/6  (very dependent)  UPPER EXTREMITY ROM:    Active ROM Right eval Left eval  Shoulder scaption 100 85  Shoulder abduction    Shoulder adduction    Shoulder extension    Shoulder internal rotation Reaches small of back Reaches to hip  Shoulder external rotation Reaches back of head  Reaches up to ear  Elbow flexion 150 130  Elbow extension (-10*)  (-35*)  Wrist flexion 35 20  Wrist extension 50 10  (Blank rows = not tested)  UPPER EXTREMITY MMT:    Tested grossly as "push"  and "pull": UE push 4-/5, UE pull 4/5  HAND FUNCTION: Grip strength: Right: 30 lbs; Left: 20 lbs  COORDINATION: Finger Nose Finger test: mildly ataxic but fairly accurate  Box and Blocks:  Right 11blocks, Left 5blocks  MUSCLE TONE:  Rt- normal, Lt- decreased and worsened by inattention/disuse  COGNITION: Overall cognitive status: Impaired and Difficulty to assess due to: severity of deficits; Poor attention, fluency, orientation, and executive function. Recall is fairly good as well as abstraction.  Seems to have personality changes due to vascular dementia- like an OCD type volitional perseveration of tapping with Rt hand that he can somewhat control.  He did become angry and agitated at the end of the session performing car transfer with spouse: he did shout at her.  VISION: Subjective report: no new changes per pt/spouse, uses "cheaters"   VISION ASSESSMENT: Visual fields seem intact though he had latent responses on the left side showing likely inattention/and/neglect, tracking somewhat delayed horizontally and vertically  PERCEPTION: Impaired: Inattention/neglect: does not attend to left visual field and does not attend to left side of body, Body scheme: poor quality of recognition with shaving, etc., and Spatial orientation: decreased in print and in orientation, especially to Lt side  PRAXIS: Not tested specifically but no overt/stated issues using pen, wearing glasses ,etc.   OBSERVATIONS: Pt taps and flicks and does seem to volitionally perseverate on making a rhythm/noise with Rt hand. He states he is aware of it and chooses to do it.  He does also seem to have mild resting and intention tremor.    TODAY'S TREATMENT:     DATE: 09/13/22- Seated edge of mat rocking forwards, x 5-10 reps in prep for sit to stands Animator modeled [employer] Up, however pt did not uses LUE despite mod v.c and facilitation) Doffing and donning flannel shirt with min A and mod v.c increased time  required. Placing large pegs into pegboard with RUE then LUE, mo-max difficulty and facilitation for LUE. Functional reach with LUE to remove pegs from pegboard, min-mod v.c for opening hand. Standing to perform functional reaching for targets on cabinets to facilitate upright posture, mod v.c Opening container to scoop coffee into another container, using bilateral UE's mod-max difficulty opening flip top coffee container. Min -mod difficulty scooping coffee, min-mod v.c for tasks. .    09/09/22- Pt reports neck pain, hotpack applied to neck x approx 20 mins while performing activities in  supine no adverse reactions to heat. Scapular retraction with gentle passive stretch to bilateral shoulders in retraction. Supine closed chain chest press and shoulder flexion, min-mod facilitation/ v.c Followed by functional reaching with trunk rotation to grasp/ release and reach for cones and hand to therapist with right and left UE's, min-mod v.c Pt demonstrates good functional reach with LUE. Seated edge of mat rocking forwards x 5 reps rpior to sit to stand, min v.c  Reading newspaper and flipping pages at table, min v.c, improved performance flipping sections of newpaper today. Pain decreased to a 4/10 end of session.  09/06/22- Standing to perform dynamic functional reaching with right and left UE's, mod v.c and close supervision for balance. Arm bike x 5 mins level 1 for conditioning, min v.c to maintain LUE grasp Seated at table, removing Wooden dowel pegs from pegboard with LUE, RUE in a pillow case to remind pt to avoid use. Replacing pegs with R and LUE, min-mod difficulty and v.c from therapist for LUE use. Opening containers grasping with LUE while opening and closing with right. Functional reaching out to the side to grasp graded clothespins yellow-blue to place on a target with LUE, min-mod difficulty.  Pt's wife wants to walk him in the [place], however she is unsure of the appropriate  device. Therapist recommended that pt's wife discusses with PT tomorrow. Pt may benefit from a U- step walker?     09/01/22- Pt arrived today stating he has neck pain on right side of neck, traps. PT ambulated back to OT treatment room with close supervision/ minguard and min v.c for step length Pt transferred to supine position with min A.  Hotpack was applied to neck x approximately 20 mins (no adverse reactions)while patient performed scapular retraction then closed chain shoulder flexion with ball, with min facilitation v.c Supine functional reaching cross body with left and right UE's to place beanbags in container, min-mod v.c and pt performed head/ neck rotation during task. Funcitonal reaching with right and left UE's for place clothespins on a vertical target at specific heights, min v.c min-mod difficulty. Seated at table, pt worked on [employer] in newspaper and locating specific items, mod difficulty/ v.c and mod facilitation, hot pack reapplied during this task x grossly 10 mins, no adverse reactions. Pt demonstrated improved ability to sustain attention to this task for grossly 6-8 mins without re-direction.   PATIENT EDUCATION: Education details: See treatment section above for details,  Person educated: Patient and Spouse Education method: Explanation, Demonstration, Tactile cues, and Verbal cues Education comprehension: verbalized understanding, returned demonstration, verbal cues required, tactile cues required, and needs further education  HOME EXERCISE PROGRAM: Access Code: [email or web address] URL: [email or web address] Date: 07/21/2022 Prepared by: Benito Mccreedy Exercises - Seated Shoulder Flexion AAROM with Dowel  - 4-6 x daily - 1 sets - 10-15 reps - Seated Shoulder Abduction AAROM with Dowel  - 4-6 x daily - 1 sets - 10-15 reps - Seated Shoulder External Rotation AAROM with Dowel  - 4-6 x daily - 1 sets - 10-15 reps - Seated Bicep Curls with Bar  - 4-6 x  daily - 1 sets - 10-15 reps - Seated Shoulder Row with Anchored Resistance  - 4-6 x daily - 1 sets - 10-15 reps     GOALS: Goals reviewed with patient? Yes   SHORT TERM GOALS: (STG required if POC>30 days) Target Date: 09/23/22   Pt will demo/state understanding of initial HEP (or home activity suggestions to maintain function as appropriate) to improve  functional skills. Goal status: 07/28/22- Met (addressed in past 2 sessions) 2. Pt will donn a t-shirt consistently with min A and v.c   Goal status: New 3. Pt will demonstrate ability to open the newspaper    with supervision min v.c Goal status: New 4.  Pt will consistently sustain focus to a simple functional task  in a minimally distracting environment x 6 mins with no more than 1 re-direct(ie: eating, functional task in clinic) Baseline:  Goal status: NEW 5. Pt will consistently open containers with only supervision and min v.c           Goal status:New    LONG TERM GOALS: Target Date: 11/16/22  Pt will improve functional ability by decreased impairment per [place] Baseman assessment from 1/6 to 2/6 or better, for better quality of life. Goal status:  not met 1/6  2.  Caregiver will state his grooming ability/hygiene ability improved from moderate assist to supervision assist. Goal status: not met-Pt continues to require mod A.  3.  He will show better functional use of left arm by improved box and blocks Test score from 5 blocks to at least 10 blocks. Goal status:not met but improved 8 blocks  4.  Pt will show ability to microwave instant coffee or small meal safely, to help decrease caregiver burden. Goal status: partially met, pt is able to microwave coffee with supervision, however occasional min A to open containers 5.  He will show improved cognitive ability enough to play a simple game with OT and caregiver. Goal status: approximating goal, pt has played a game of uno at home with family.  6.  Caregiver will  state at least 4 ways to decrease perseveration with right arm, also stating that this behaviors at least somewhat better in the home now. Goal status: deferred perseverative behaviors have improved.  7. Pt will donn a flannel button front shirt consistently with min A and min v.c Baseline: mod-max Goal status:  ongoing 09/01/22 8.  Pt will use his LUE as an active assist for ADLS grossly [id number] of the time with min v.c Baseline: uses 25% Goal status:  ongoing 09/01/22  9 Pt will stand at the countertop and perform functional activities  incorporating bilateral UE's with supervision and min v.c  Goal status:  ongoing 09/01/22 10. Pt will back up to a chair and sit down squarely at least 1/3 of the time with min v.c  Goal status: ongoing  11..  Pt will open the newspaper and locate sports section with no more than min v.c                          Goal status:  ongoing  ASSESSMENT:  CLINICAL IMPRESSION: Pt is progressing towards goals. Pt demonstrates increased  difficulty with LUE functional use. Pt was noted to require increased cueing for posture today. It may be related to time of day.OT Frequency/DURATION: 2x week x 12 weeks however anticipate d/c after 8 weeks dependent on progress  PLANNED INTERVENTIONS: self care/ADL training, therapeutic exercise, therapeutic activity, neuromuscular re-education, manual therapy, balance training, functional mobility training, moist heat, cryotherapy, patient/family education, cognitive remediation/compensation, visual/perceptual remediation/compensation, psychosocial skills training, energy conservation, coping strategies training, DME and/or AE instructions, and Re-evaluation  RECOMMENDED OTHER SERVICES: He has already seen PT for balance and functional mobility, he would probably benefit from speech for cognitive issues as well, though OT will be working with cognition somewhat functionally now.  CONSULTED AND  AGREED WITH PLAN OF CARE: Patient and family  member/caregiver  PLAN FOR NEXT SESSION:   Functional reaching, functional use of bilateral UE's in standing,    Theone Murdoch, OTR/L 4:08 PM 09/13/22

## 2022-09-14 ENCOUNTER — Other Ambulatory Visit (HOSPITAL_BASED_OUTPATIENT_CLINIC_OR_DEPARTMENT_OTHER): Payer: Self-pay

## 2022-09-14 ENCOUNTER — Ambulatory Visit: Payer: PPO

## 2022-09-14 DIAGNOSIS — M6281 Muscle weakness (generalized): Secondary | ICD-10-CM | POA: Diagnosis not present

## 2022-09-14 DIAGNOSIS — R262 Difficulty in walking, not elsewhere classified: Secondary | ICD-10-CM

## 2022-09-14 DIAGNOSIS — R278 Other lack of coordination: Secondary | ICD-10-CM

## 2022-09-14 DIAGNOSIS — M25512 Pain in left shoulder: Secondary | ICD-10-CM

## 2022-09-14 DIAGNOSIS — I69354 Hemiplegia and hemiparesis following cerebral infarction affecting left non-dominant side: Secondary | ICD-10-CM

## 2022-09-14 DIAGNOSIS — Z9181 History of falling: Secondary | ICD-10-CM

## 2022-09-14 DIAGNOSIS — R2689 Other abnormalities of gait and mobility: Secondary | ICD-10-CM

## 2022-09-14 DIAGNOSIS — R41840 Attention and concentration deficit: Secondary | ICD-10-CM

## 2022-09-14 DIAGNOSIS — I69318 Other symptoms and signs involving cognitive functions following cerebral infarction: Secondary | ICD-10-CM

## 2022-09-14 NOTE — Therapy (Signed)
OUTPATIENT PHYSICAL THERAPY NEURO Treatment   Patient Name: William [name] MRN: [id number] DOB:07-Oct-1940, 82 y.o., male Today's Date: 09/14/2022  PCP: Lowne Chase, [place] PROVIDER: same  END OF SESSION:  PT End of Session - 09/14/22 1631     Visit Number 19    Date for PT Re-Evaluation 09/29/22    PT Start Time 1630    PT Stop Time 1715    PT Time Calculation (min) 45 min    Equipment Utilized During Treatment Gait belt    Activity Tolerance Patient tolerated treatment well    Behavior During Therapy Impulsive              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   HTN (hypertension) 07/19/2015   Hyperlipidemia    Major neurocognitive disorder 01/09/2014   Mild, related to stroke history   Nocturia    Renal insufficiency 06/25/2013   S/P carotid endarterectomy    BILATERAL ICA--  PATENT PER DUPLEX  05-19-2012   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   Right calf   Urinary frequency    Vitamin D deficiency    Past Surgical History:  Procedure Laterality Date   APPENDECTOMY  AS CHILD   CARDIOVASCULAR STRESS TEST  03-27-2012  DR CRENSHAW   LOW RISK LEXISCAN STUDY-- PROBABLE NORMAL PERFUSION AND SOFT TISSUE ATTENUATION/  NO ISCHEMIA/ EF 51%   CAROTID ENDARTERECTOMY Bilateral LEFT  11-12-2008  DR GREG HAYES   RIGHT ICA  2006  ([facility])   CHOLECYSTECTOMY N/A 02/23/2022   Procedure: LAPAROSCOPIC CHOLECYSTECTOMY;  Surgeon: Felicie Morn, MD;  Location: [place];  Service: General;  Laterality: N/A;   CYSTOSCOPY W/ RETROGRADES  Bilateral 06/22/2021   Procedure: CYSTOSCOPY WITH RETROGRADE PYELOGRAM;  Surgeon: Franchot Gallo, MD;  Location: [place];  Service: Urology;  Laterality: Bilateral;   CYSTOSCOPY WITH LITHOLAPAXY N/A 02/26/2013   Procedure: CYSTOSCOPY WITH LITHOLAPAXY;  Surgeon: Franchot Gallo, MD;  Location: [facility];  Service: Urology;  Laterality: N/A;   ENDOSCOPIC RETROGRADE CHOLANGIOPANCREATOGRAPHY (ERCP) WITH PROPOFOL N/A 02/22/2022   Procedure: ENDOSCOPIC RETROGRADE CHOLANGIOPANCREATOGRAPHY (ERCP) WITH PROPOFOL;  Surgeon: Carol Ada, MD;  Location: [place];  Service: Gastroenterology;  Laterality: N/A;   EYE SURGERY  Jan. 2016   cataract surgery both eyes   INGUINAL HERNIA REPAIR Right 11-08-2006   IR KYPHO EA ADDL LEVEL THORACIC OR LUMBAR  02/12/2021   IR RADIOLOGIST EVAL & MGMT  02/18/2021   MASS EXCISION N/A 03/03/2016   Procedure: EXCISION OF BACK  MASS;  Surgeon: Stark Klein, MD;  Location: [place];  Service: General;  Laterality: N/A;   MOHS SURGERY Left 1/ 2016   Dr Nevada Crane-- Basal cell   PROSTATE SURGERY     REMOVAL OF STONES  02/22/2022   Procedure: REMOVAL OF STONES;  Surgeon: Carol Ada, MD;  Location: [place];  Service: Gastroenterology;;   [name]  02/22/2022   Procedure: [name];  Surgeon: Carol Ada, MD;  Location: [place];  Service: Gastroenterology;;   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-C N/A 06/22/2021   Procedure: TRANSURETHRAL RESECTION OF BLADDER TUMOR;  Surgeon: Franchot Gallo, MD;  Location: [facility];  Service: Urology;  Laterality: N/A;   TRANSURETHRAL RESECTION OF PROSTATE N/A 02/26/2013   Procedure: TRANSURETHRAL RESECTION OF THE PROSTATE WITH GYRUS INSTRUMENTS;  Surgeon: Franchot Gallo, MD;  Location: [facility];  Service: Urology;  Laterality: N/A;   TRANSURETHRAL RESECTION OF PROSTATE N/A 06/22/2021   Procedure: TRANSURETHRAL  RESECTION OF THE PROSTATE (TURP);  Surgeon: Franchot Gallo, MD;  Location: [facility];  Service: Urology;  Laterality: N/A;   Patient Active Problem List   Diagnosis Date Noted   Dysuria 08/03/2022   Nonintractable headache 07/01/2022   Bilateral impacted cerumen 06/24/2022   Rash 06/15/2022   Postoperative ileus 02/27/2022   Ileus, postoperative 02/26/2022   Choledocholithiasis 02/19/2022   DNR (do not resuscitate) 02/19/2022   Left elbow pain 01/26/2022   Mid back pain on left side 01/26/2022   Rib pain 01/26/2022   Acute pain of left shoulder 11/12/2021   Leukocytes in urine 11/12/2021   Urinary frequency 11/12/2021   Thrush 10/08/2021   Hemiplegia, dominant side S/P CVA (cerebrovascular accident) 09/11/2021   Insomnia    Prediabetes    Acute 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    Basal ganglia infarction 07/29/2021   Transaminitis 07/27/2021   UTI (urinary tract infection) 07/27/2021   CVA (cerebral vascular accident) 07/27/2021   Fall 07/27/2021   Hyperglycemia 07/27/2021   Cholelithiasis 07/27/2021   Hypoxia 07/27/2021   Nausea and vomiting [id number]   Acute metabolic encephalopathy [id number]   Normocytic anemia 07/27/2021   Chronic back pain 07/27/2021   Malignant neoplasm of overlapping sites of bladder 06/22/2021   Closed fracture of first lumbar vertebra with routine healing 02/03/2021   Closed fracture of multiple ribs 11/18/2020   Anxiety 01/29/2020   Leg pain, bilateral 01/29/2020   Ingrown toenail 07/13/2019   Lumbar spondylosis 05/02/2018   Pain in left knee 03/09/2018   Osteoarthritis of left hip 01/16/2018   Trochanteric bursitis of left hip 01/16/2018   Preventative health care 09/26/2017   HTN (hypertension) 07/19/2015   Hyperlipidemia 07/19/2015   Great toe pain 02/11/2014   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   Obesity (BMI 30-39.9) 06/25/2013   Renal insufficiency 06/25/2013    Weakness of left arm 06/25/2013   Sebaceous cyst 03/03/2011   Sprain of lumbar region 07/31/2010   Rib pain, left 08/29/2009   Carotid artery stenosis, asymptomatic, bilateral 05/02/2009   Eczema, atopic 05/31/2008   Vitamin D deficiency 03/01/2008   BPH (benign prostatic hyperplasia) 08/06/2007   Fasting hyperglycemia 12/21/2006   History of right MCA infarct 2006    ONSET DATE: 06/29/22  REFERRING DIAG:  Diagnosis  I63.9 (ICD-10-CM) - Cerebrovascular accident (CVA), unspecified mechanism ([place])  W19.XXXD (ICD-10-CM) - Fall, subsequent encounter    THERAPY DIAG:  Muscle weakness (generalized)  Hemiplegia and hemiparesis following cerebral infarction affecting left non-dominant side  Other lack of coordination  Acute pain of left shoulder  Attention and concentration deficit  Other symptoms and signs involving cognitive functions following cerebral infarction  Other abnormalities of gait and mobility  Difficulty in walking, not elsewhere classified  History of falling  Rationale for Evaluation and Treatment: Rehabilitation  SUBJECTIVE:  SUBJECTIVE STATEMENT: No falls, I am well rested and ready to go.    PERTINENT HISTORY:  TORELL CARDENA is a 82 year old man with dementia, CKD, HTN, CAD, HLD and history of CVA  (2006, 2021), Parkinson's  PAIN:  Are you having pain? No  PRECAUTIONS: Fall  WEIGHT BEARING RESTRICTIONS: No  FALLS: Has patient fallen in last 6 months? No  LIVING ENVIRONMENT: Lives with: lives with their family and lives with their spouse Lives in: House/apartment Stairs:  1 brick high step Has following equipment at home: Environmental consultant - 2 wheeled, Wheelchair (manual), Shower bench, bed side commode, Grab bars, and hospital bed, sliding pad for car  PLOF:  Independent with basic ADLs and Independent with household mobility without device  PATIENT GOALS: Walk around the block with his wife. Be able to use the dining room chair rather than have to use the W/C. Increased I with bed mobility and simple tasks at home like make some coffee or brush his teeth, Step over tub to shower, has bench, but he can't really use it. Up and down the one step to get to back deck.  OBJECTIVE:   DIAGNOSTIC FINDINGS:  MRI 2021 with degenerative changes in lumbar spine, L3-4 R subarticular stenosis  COGNITION: Overall cognitive status: History of cognitive impairments - at baseline   SENSATION: Not tested  COORDINATION: Moderately impaired BLE   MUSCLE LENGTH: Hamstrings: severely restricted B Thomas test: Severely restricted B.  POSTURE: rounded shoulders, forward head, increased thoracic kyphosis, posterior pelvic tilt, and flexed trunk   LOWER EXTREMITY ROM:   BLE extremely stiff throughout, limited hip ROM in all planes, B knees limited in extension.   LOWER EXTREMITY MMT:  3+/5 throughout. Unable to determine accuracy of MMT due to cognition. Functionally demosntrates poor coordinated activation and poor muscular endurance.   BED MOBILITY: Min A for Supine<> Sit. Patient was using a ladder in his hospital bed and could move MI.  TRANSFERS: Min A, mod VC, tends to lean back.  CURB: Min A-Has one step out to his deck.  STAIRS: N/A   GAIT: Gait pattern: step to pattern, decreased arm swing- Right, decreased arm swing- Left, decreased step length- Right, decreased step length- Left, shuffling, festinating, trunk flexed, narrow BOS, poor foot clearance- Right, and poor foot clearance- Left Distance walked: 57' Assistive device utilized: Environmental consultant - 2 wheeled Level of assistance: Occasional min A Comments: mod VC to stay close to walker, take longer steps.  FUNCTIONAL TESTS:  5 times sit to stand: 38.23 Timed up and go (TUG): 47.91  with RW,  07/21/22 TUG 29 seconds without device  TODAY'S TREATMENT:                                                                                                                               DATE:  09/14/22 NuStep L5 x63mins UBE L2 67mins forwards and back  Volleyball hits sitting 2x10 each hand  Leg  ext 5# 2x10 HS curls10# 2x10 Ball squeezes 2x10 Hip abduction with green band 2x10   09/07/22 Nustep level 5 x 6 minutes  Tmill  MPH CGA x 1 minute Gait outside around the parking island with one rest break Stairs step over step In pbars marching and hip abduction Walking forward and backward in pbars Gait outside in the grass  09/02/22 Nustep level 5 x 5 minutes Walking HHA outside halfway around the parking island and then rest on table, then back in 2.5# on the left ankle having him sit and raise foot up and abduct to a 6" step on the left Bed mobility rolling side to side Bridges, partial sit ups Walk 300 feet with HHA a lot of cues for the step length [employer] ball standing and sitting  08/31/22 Nustep level 5 x 6 mintues On airex single arm 5# pulls Volleyball standing Ball kicks Picking up ball from floor Sitting and standing cone reach and pick up working on balance, weight shift and reach 20# resisted gait front and back Direction changes 4" toe touches Bed mobility getting up from supine Worked on safe sitting turning and feeling the chair on both legs  08/26/22 I met him at the car and walked in the grass and did some curb negotiating and more walking. Seated volley ball 5# straight arm pulls on and off airex Leg press 20# 2x10 Side stepping, fwd and backward walking 2# LAQ 2# marches 2# hip abduction Worked on sitting trunk motions side to side and leaning forward as he was tending to lean to the back today USe of SPC cone toe touches  08/24/22 volleyball standing Standing ball kicks Nustep level 5 x 6 minutes Walk outside partially around the island and  then rest and then back in, the first part of the walk was great, good steps, once he got on grass much more difficult then this was tough to recover from Side stepping, back ward walking Fast walking Picking up cones on the mat table having turn turn, twist and reach, then did from the floor and then did reaching on top of the weight tower Yellow tband ankle exercises Green tband HS curls   08/18/22 UBE level 1 x 4 minutes Bike level 1 x 5 minutes Nustep level 4 x 5 minutes Sit to stand from mat table slightly raised a lot of cues Side stepping Forward and backward walking, direction changes Seated elbow touches Supine, trunk rotations, partial sit ups, left leg hip abduction with use of sliding board, feet on ball K2C, trunk rotation, bridges and isometric abs Walking 200 feet with cues  08/16/22 Standing hip marches and hip abduction and extension in Pbars with a lot of cues for the movements Stairs step over step with one hand rail and verbal cues to assure clearing the toe UBE level 3 x 5 minutes Leg press with a lot of help getting on and off 20# a lot of cues needed for bigger motions Side stepping, forward and back ward walking with HHA Gait outside down small slope, down and up curb then back with a rest with CGA and a lot of cues Gait outside the front 200 feet to the car  08/11/22 Side stepping in pbars Marches, hip abduction, extension Fast walking Stairs 4" and 6" Nustep level 5 x 6 minutes Sit to stand multiple times and multiple ways On airex head turns, reaching Seated volleyball, standing volleyball Seated ball kicks Gait outside around the [place] and to  the car   08/09/22 NuStep L5 x 6 minutes Functional status re-assessed for PN-see goals. Balance training-standing on Air ex pad in parallel bars, rotation to L, holding therapist's hand only for balance, up to min A.   PATIENT EDUCATION: Education details: POC Person educated: Patient and  Spouse Education method: Explanation Education comprehension: verbalized understanding  HOME EXERCISE PROGRAM: TBD  GOALS: Goals reviewed with patient? Yes  SHORT TERM GOALS: Target date: 07/19/22  I with initial HEP Baseline: Goal status: ongoing  2. Decrease 5 x STS to < 25 sec to demonstrate improved functional strength  Baseline: 38 sec  Goal status: 08/09/22 14 sec-met  3. Decrease TUG to < 35 sec to demonstrate improved functional balance  Baseline: 47 sec  Goal status: met 07/21/22  4. Perform sit <> stand transfers from average chair height with S and VC to be able to use the swivel chair at his dining table.  Baseline: Min A  Goal status: 08/09/22-Inconsistently performs  with S from average height. At times requires CGA to cue forward weight shift. ongoing  LONG TERM GOALS: Target date: 09/29/22  Patient will be able to step over his tub at home using grab bars for stability with CGA to access his shower. Baseline: Unable- has bench, but wife reports he steps over then sits. Goal status: ongoing 08/09/22  2.  Decrease 5 x STS to < 18 sec to demonstrate improved functional strength Baseline:  Goal status: 15-2/26/24-met  3.  Decrease TUG to < 25 sec to demonstrate improved functional balance Baseline:  Goal status: 08/09/22  19 sec-met  4.  Patient will perform his bed mobility with MI. Baseline: Min A Goal status: 08/09/22- I as demo on mat-met  5.  Patient will ambulate at least 400' with LRAD, CGA, demonstrate improved upright posture, longer step length. Baseline:  Goal status: 08/09/22-Amb 400', but began to fatigue after about 300' with more flexed posture, L foot shuffling more, unable to maintain safe technique. ongoing  6.  Patient will manage up and down a single step with LRAD, CGA to access his deck at home. Baseline: Unable Goal status: 08/09/22-Up and down 1 step with U rail. ongoing  ASSESSMENT:  CLINICAL IMPRESSION: Patient returns in good  spirits. We worked on some strengthening and coordination today. Needs heavy cues for all interventions. Had some difficulty with the knee machines and especially getting off of it today.   OBJECTIVE IMPAIRMENTS: Abnormal gait, decreased activity tolerance, decreased balance, decreased cognition, decreased coordination, decreased endurance, decreased mobility, difficulty walking, decreased ROM, decreased strength, decreased safety awareness, impaired flexibility, impaired UE functional use, improper body mechanics, and postural dysfunction.   ACTIVITY LIMITATIONS: carrying, lifting, bending, sitting, standing, squatting, stairs, transfers, bed mobility, bathing, toileting, dressing, hygiene/grooming, and locomotion level  PARTICIPATION LIMITATIONS: meal prep, cleaning, laundry, interpersonal relationship, shopping, and community activity  PERSONAL FACTORS: Age, Past/current experiences, and 3+ comorbidities: CVA, Parkinson's traits, dementia  are also affecting patient's functional outcome.   REHAB POTENTIAL: Good  CLINICAL DECISION MAKING: Evolving/moderate complexity  EVALUATION COMPLEXITY: Moderate  PLAN:  PT FREQUENCY: 2x/week  PT DURATION: 12 weeks  PLANNED INTERVENTIONS: Therapeutic exercises, Therapeutic activity, Neuromuscular re-education, Balance training, Gait training, Patient/Family education, Self Care, Joint mobilization, Stair training, Moist heat, and Manual therapy  PLAN FOR NEXT SESSION: Trunk and extremity mobilization, balance, strength, functional mobility re-education.  Lum Babe, PT 09/14/22 5:14 PM

## 2022-09-15 ENCOUNTER — Ambulatory Visit: Payer: PPO | Admitting: Occupational Therapy

## 2022-09-15 DIAGNOSIS — M25512 Pain in left shoulder: Secondary | ICD-10-CM

## 2022-09-15 DIAGNOSIS — M6281 Muscle weakness (generalized): Secondary | ICD-10-CM | POA: Diagnosis not present

## 2022-09-15 DIAGNOSIS — R278 Other lack of coordination: Secondary | ICD-10-CM

## 2022-09-15 DIAGNOSIS — R2689 Other abnormalities of gait and mobility: Secondary | ICD-10-CM

## 2022-09-15 DIAGNOSIS — R41840 Attention and concentration deficit: Secondary | ICD-10-CM

## 2022-09-15 DIAGNOSIS — I69318 Other symptoms and signs involving cognitive functions following cerebral infarction: Secondary | ICD-10-CM

## 2022-09-15 DIAGNOSIS — I69354 Hemiplegia and hemiparesis following cerebral infarction affecting left non-dominant side: Secondary | ICD-10-CM

## 2022-09-15 NOTE — Therapy (Signed)
OUTPATIENT OCCUPATIONAL THERAPY TREATMENT NOTE  Patient Name: William [name] MRN: [id number] DOB:05-05-1941, 82 y.o., male Today's Date: 09/16/2022  PCP & REFERRING PROVIDER:  Carollee Herter, Alferd Apa, DO    END OF SESSION:  OT End of Session - 09/16/22 0956     Visit Number 15    Number of Visits 32    Date for OT Re-Evaluation 11/16/22    Authorization Type [employer]    Authorization - Visit Number 15    Progress Note Due on Visit 20    OT Start Time 1532    OT Stop Time 1615    OT Time Calculation (min) 43 min                      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   HTN (hypertension) 07/19/2015   Hyperlipidemia    Major neurocognitive disorder 01/09/2014   Mild, related to stroke history   Nocturia    Renal insufficiency 06/25/2013   S/P carotid endarterectomy    BILATERAL ICA--  PATENT PER DUPLEX  05-19-2012   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   Right calf   Urinary frequency    Vitamin D deficiency    Past Surgical History:  Procedure Laterality Date   APPENDECTOMY  AS CHILD   CARDIOVASCULAR STRESS TEST  03-27-2012  DR CRENSHAW   LOW RISK LEXISCAN STUDY-- PROBABLE NORMAL PERFUSION AND SOFT TISSUE ATTENUATION/  NO ISCHEMIA/ EF 51%   CAROTID ENDARTERECTOMY Bilateral LEFT  11-12-2008  DR GREG HAYES   RIGHT ICA  2006  ([facility])   CHOLECYSTECTOMY N/A 02/23/2022   Procedure: LAPAROSCOPIC CHOLECYSTECTOMY;  Surgeon: Felicie Morn, MD;  Location: [place];  Service: General;  Laterality: N/A;   CYSTOSCOPY W/  RETROGRADES Bilateral 06/22/2021   Procedure: CYSTOSCOPY WITH RETROGRADE PYELOGRAM;  Surgeon: Franchot Gallo, MD;  Location: [place];  Service: Urology;  Laterality: Bilateral;   CYSTOSCOPY WITH LITHOLAPAXY N/A 02/26/2013   Procedure: CYSTOSCOPY WITH LITHOLAPAXY;  Surgeon: Franchot Gallo, MD;  Location[facility];  Service: Urology;  Laterality: N/A;   ENDOSCOPIC RETROGRADE CHOLANGIOPANCREATOGRAPHY (ERCP) WITH PROPOFOL N/A 02/22/2022   Procedure: ENDOSCOPIC RETROGRADE CHOLANGIOPANCREATOGRAPHY (ERCP) WITH PROPOFOL;  Surgeon: Carol Ada, MD;  Location: [place];  Service: Gastroenterology;  Laterality: N/A;   EYE SURGERY  Jan. 2016   cataract surgery both eyes   INGUINAL HERNIA REPAIR Right 11-08-2006   IR KYPHO EA ADDL LEVEL THORACIC OR LUMBAR  02/12/2021   IR RADIOLOGIST EVAL & MGMT  02/18/2021   MASS EXCISION N/A 03/03/2016   Procedure: EXCISION OF BACK  MASS;  Surgeon: Stark Klein, MD;  Location: [place];  Service: General;  Laterality: N/A;   MOHS SURGERY Left 1/ 2016   Dr Nevada Crane-- Basal cell   PROSTATE SURGERY     REMOVAL OF STONES  02/22/2022   Procedure: REMOVAL OF STONES;  Surgeon: Carol Ada, MD;  Location: [place];  Service: Gastroenterology;;  SPHINCTEROTOMY  02/22/2022   Procedure: SPHINCTEROTOMY;  Surgeon: Carol Ada, MD;  Location: [facility] ENDOSCOPY;  Service: Gastroenterology;;   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-C N/A 06/22/2021   Procedure: TRANSURETHRAL RESECTION OF BLADDER TUMOR;  Surgeon: Franchot Gallo, MD;  Location: [facility];  Service: Urology;  Laterality: N/A;   TRANSURETHRAL RESECTION OF PROSTATE N/A 02/26/2013   Procedure: TRANSURETHRAL RESECTION OF THE PROSTATE WITH GYRUS INSTRUMENTS;  Surgeon: Franchot Gallo, MD;  Location: [facility];  Service: Urology;  Laterality: N/A;   TRANSURETHRAL RESECTION OF PROSTATE N/A 06/22/2021   Procedure:  TRANSURETHRAL RESECTION OF THE PROSTATE (TURP);  Surgeon: Franchot Gallo, MD;  Location: [facility];  Service: Urology;  Laterality: N/A;   Patient Active Problem List   Diagnosis Date Noted   Dysuria 08/03/2022   Nonintractable headache 07/01/2022   Bilateral impacted cerumen 06/24/2022   Rash 06/15/2022   Postoperative ileus 02/27/2022   Ileus, postoperative 02/26/2022   Choledocholithiasis 02/19/2022   DNR (do not resuscitate) 02/19/2022   Left elbow pain 01/26/2022   Mid back pain on left side 01/26/2022   Rib pain 01/26/2022   Acute pain of left shoulder 11/12/2021   Leukocytes in urine 11/12/2021   Urinary frequency 11/12/2021   Thrush 10/08/2021   Hemiplegia, dominant side S/P CVA (cerebrovascular accident) 09/11/2021   Insomnia    Prediabetes    Acute 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    Basal ganglia infarction 07/29/2021   Transaminitis 07/27/2021   UTI (urinary tract infection) 07/27/2021   CVA (cerebral vascular accident) 07/27/2021   Fall 07/27/2021   Hyperglycemia 07/27/2021   Cholelithiasis 07/27/2021   Hypoxia 07/27/2021   Nausea and vomiting [id number]   Acute metabolic encephalopathy 123XX123   Normocytic anemia 07/27/2021   Chronic back pain 07/27/2021   Malignant neoplasm of overlapping sites of bladder 06/22/2021   Closed fracture of first lumbar vertebra with routine healing 02/03/2021   Closed fracture of multiple ribs 11/18/2020   Anxiety 01/29/2020   Leg pain, bilateral 01/29/2020   Ingrown toenail 07/13/2019   Lumbar spondylosis 05/02/2018   Pain in left knee 03/09/2018   Osteoarthritis of left hip 01/16/2018   Trochanteric bursitis of left hip 01/16/2018   Preventative health care 09/26/2017   HTN (hypertension) 07/19/2015   Hyperlipidemia 07/19/2015   Great toe pain 02/11/2014   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9 (ICD-10-CM) - Cerebrovascular accident (CVA), unspecified mechanism ([place])  W19.XXXD (ICD-10-CM) - Fall, subsequent encounter    THERAPY DIAG:  Muscle weakness (generalized)  Hemiplegia and hemiparesis following cerebral infarction affecting left non-dominant side  Other lack of coordination  Acute pain of left shoulder  Attention and concentration deficit  Other symptoms and signs involving cognitive functions following cerebral infarction  Other abnormalities of gait and mobility  Rationale for Evaluation and Treatment: Rehabilitation  PERTINENT HISTORY: PMHx includes L  basal ganglia and corona radiata infarct 07/27/21, L lower homonymous quadrantanopia; R MCA infarct in 2006 w/ L-sided hemiparesis, NCD, carotid artery disease (bilateral), HTN, HLD,CKD3, vascular dementia, and hx of bladder cancer. He has had falls in 2023, fracturing his clavicle in August, then he had stomach surgery in Sept 2023. He has He needs assist at baseline for ADLs. When last documented in acute rehab after having his gall-bladder removed, (03/09/22) he required moderate to maximum assist x2 for most ADLs (more assist for ADLs the require standing), and they recommended rehab in skilled nursing setting. He apparently got Covid at some point and also attended a SNF. Once he D/C home, he had some episodes of combativeness at night  with "sun-downing."  He is communicative  and appropriate, though appears easily confused and distracted, not fully oriented to time and place (Ax2, states date is 10 and cannot say what city he lives in). His wife provides most details, and he is participative, though a poor historian at times. In a nut shell- he has been suffering from dementia and post-stroke effects for years, and his issues were recently exacerbated in Sept of last year after abdominal surgery. He has less use of Lt "stroke side" and new, ritualistic "ticks" in mouth and Rt hand/arm.  Wife states that they do have a caregiver come M, W, F for 4 hours in the middle of the day to mainly help him bathe and dress, which usually tires him out. He has been more combative towards the end of the day as well.   PRECAUTIONS: Fall and Other: Lt hemi, poor cognition, LATEX and adhesive allergies, etc.  ;WEIGHT BEARING RESTRICTIONS: No   SUBJECTIVE:   SUBJECTIVE STATEMENT: It's a good day  Pt accompanied by:  his wife  PAIN:  Are you having pain? Yes: NPRS scale:6/10 on arrival, 4/10 end of session Pain location: neck Pain description: aching Aggravating factors: malpositioning Relieving factors: heat  FALLS: Has patient fallen in last 6 months? Yes. Number of falls at least 1 resulting in clavicle fx (now healed)   LIVING ENVIRONMENT: Lives with: lives with their spouse Lives in: House/apartment Has following equipment at home: manual w/c, 2 w/w, weighted utensils, shower bench (for tub), bed cane, hospital bed, raised toilet, and possibly more  PLOF: Needs assistance with ADLs, Needs assistance with homemaking, Needs assistance with gait, and Needs assistance with transfers  PATIENT GOALS: at eval- increase personal hygiene skills (brush teeth, shaving)  prepare instant coffee or microwave meal improve functional cognitive skills to play games, etc.,   increase Lt arm usage try to help decrease Rt hand/arm perseveration (flicking, tapping, noise making) Improve  fnl tranfers in/out of multiple seats (tub bench, swivel chair, etc.)    OBJECTIVE: (All objective assessments below are from initial evaluation on: 07/07/22 unless otherwise specified.)   HAND DOMINANCE: Right  ADLs: Overall ADLs: Wife states decreased ability since hospitalization in Sept 23.  Transfers/ambulation related to ADLs: Min A transfer, CGA for mobility Eating: set up Grooming: Mod A- needs assist with oral care, shaving, but can comb his hair UB Dressing: Min A  LB Dressing: Dep  Toileting:  Mod  Bathing: Mod  Tub Shower transfers: Min A  Equipment: Radio broadcast assistant, Grab bars, and Feeding equipment  IADLs: completely dependent for years now, except some ability to use microwave which is now not possible after hospitalization   MOBILITY STATUS: Needs Assist: CGA - Min A, Hx of falls, Festination, difficulty with turns, and neglect  on Lt side  POSTURE COMMENTS:  rounded shoulders, forward head, and weight shift left  ACTIVITY TOLERANCE: Activity tolerance: he became tired and somewhat agitated by the end of the OT evaluation today (after PT and OT about 2.5hours with mostly sedate activities in seated positions)  FUNCTIONAL OUTCOME MEASURES: [facility] 17/30  showing problems with visuospatial/executive function, attention, fluency, and orientation. (at least 26/30 is needed to be considered "normal")  [employer] in ADLs: 1/6  (very dependent)  UPPER EXTREMITY ROM:    Active ROM Right eval Left eval  Shoulder scaption 100 85  Shoulder abduction    Shoulder adduction    Shoulder extension    Shoulder internal rotation Reaches small of back Reaches to hip  Shoulder external rotation Reaches back of head  Reaches up to ear  Elbow flexion 150 130  Elbow extension (-10*)  (-35*)  Wrist flexion 35 20  Wrist extension 50 10  (Blank rows = not tested)  UPPER EXTREMITY MMT:    Tested grossly as "push" and "pull": UE push 4-/5, UE pull 4/5  HAND  FUNCTION: Grip strength: Right: 30 lbs; Left: 20 lbs  COORDINATION: Finger Nose Finger test: mildly ataxic but fairly accurate  Box and Blocks:  Right 11blocks, Left 5blocks  MUSCLE TONE:  Rt- normal, Lt- decreased and worsened by inattention/disuse  COGNITION: Overall cognitive status: Impaired and Difficulty to assess due to: severity of deficits; Poor attention, fluency, orientation, and executive function. Recall is fairly good as well as abstraction.  Seems to have personality changes due to vascular dementia- like an OCD type volitional perseveration of tapping with Rt hand that he can somewhat control.  He did become angry and agitated at the end of the session performing car transfer with spouse: he did shout at her.  VISION: Subjective report: no new changes per pt/spouse, uses "cheaters"   VISION ASSESSMENT: Visual fields seem intact though he had latent responses on the left side showing likely inattention/and/neglect, tracking somewhat delayed horizontally and vertically  PERCEPTION: Impaired: Inattention/neglect: does not attend to left visual field and does not attend to left side of body, Body scheme: poor quality of recognition with shaving, etc., and Spatial orientation: decreased in print and in orientation, especially to Lt side  PRAXIS: Not tested specifically but no overt/stated issues using pen, wearing glasses ,etc.   OBSERVATIONS: Pt taps and flicks and does seem to volitionally perseverate on making a rhythm/noise with Rt hand. He states he is aware of it and chooses to do it.  He does also seem to have mild resting and intention tremor.    TODAY'S TREATMENT:     DATE: 09/15/22-Standing at countertop to perform dynamic reaching to left and right sides to targets followed by dynamic functional reaching to right and left sides using bilateral UE's, min-mod v.c and facilitation. Seated at table, flipping pages and reading articles from newspaper, min-mod v.c and min  facilitation for flipping pages,. Pt sustained attention for at least 6 mins at a time during this task. Functional reaching with left and right UE's to place graded clothespins on target, mod difficulty with LUE pinch, min-mod v.c Doffing then donning flannel shirt, mod assist doffing in chair due to positioning, min-mod assist and mod v.c donning in standing.  09/13/22- Seated edge of mat rocking forwards, x 5-10 reps in prep for sit to stands Animator modeled [employer]! Up, however pt did not uses LUE despite mod v.c and facilitation) Doffing and donning flannel shirt with min A and  mod v.c increased time required. Placing large pegs into pegboard with RUE then LUE, mo-max difficulty and facilitation for LUE. Functional reach with LUE to remove pegs from pegboard, min-mod v.c for opening hand. Standing to perform functional reaching for targets on cabinets to facilitate upright posture, mod v.c Opening container to scoop coffee into another container, using bilateral UE's mod-max difficulty opening flip top coffee container. Min -mod difficulty scooping coffee, min-mod v.c for tasks. .    09/09/22- Pt reports neck pain, hotpack applied to neck x approx 20 mins while performing activities in supine no adverse reactions to heat. Scapular retraction with gentle passive stretch to bilateral shoulders in retraction. Supine closed chain chest press and shoulder flexion, min-mod facilitation/ v.c Followed by functional reaching with trunk rotation to grasp/ release and reach for cones and hand to therapist with right and left UE's, min-mod v.c Pt demonstrates good functional reach with LUE. Seated edge of mat rocking forwards x 5 reps rpior to sit to stand, min v.c  Reading newspaper and flipping pages at table, min v.c, improved performance flipping sections of newpaper today. Pain decreased to a 4/10 end of session.  PATIENT EDUCATION: Education details: See treatment section above for details,  Person  educated: Patient and Spouse Education method: Explanation, Demonstration, Tactile cues, and Verbal cues Education comprehension: verbalized understanding, returned demonstration, verbal cues required, tactile cues required, and needs further education  HOME EXERCISE PROGRAM: Access Code: [email or web address] URL: [email or web address] Date: 07/21/2022 Prepared by: Benito Mccreedy Exercises - Seated Shoulder Flexion AAROM with Dowel  - 4-6 x daily - 1 sets - 10-15 reps - Seated Shoulder Abduction AAROM with Dowel  - 4-6 x daily - 1 sets - 10-15 reps - Seated Shoulder External Rotation AAROM with Dowel  - 4-6 x daily - 1 sets - 10-15 reps - Seated Bicep Curls with Bar  - 4-6 x daily - 1 sets - 10-15 reps - Seated Shoulder Row with Anchored Resistance  - 4-6 x daily - 1 sets - 10-15 reps     GOALS: Goals reviewed with patient? Yes   SHORT TERM GOALS: (STG required if POC>30 days) Target Date: 09/23/22   Pt will demo/state understanding of initial HEP (or home activity suggestions to maintain function as appropriate) to improve functional skills. Goal status: 07/28/22- Met (addressed in past 2 sessions) 2. Pt will donn a t-shirt consistently with min A and v.c   Goal status: New 3. Pt will demonstrate ability to open the newspaper    with supervision min v.c Goal status:  ongoing, inconsistent, min A at times 4.  Pt will consistently sustain focus to a simple functional task  in a minimally distracting environment x 6 mins with no more than 1 re-direct(ie: eating, functional task in clinic) Baseline:  Goal status:  inconsistent, however pt has been able to sutain attention to newspaper task x 6 mins several times recently 4324 5. Pt will consistently open containers with only supervision and min v.c           Goal status:New    LONG TERM GOALS: Target Date: 11/16/22  Pt will improve functional ability by decreased impairment per Redmond Baseman assessment from 1/6 to 2/6 or  better, for better quality of life. Goal status:  not met 1/6  2.  Caregiver will state his grooming ability/hygiene ability improved from moderate assist to supervision assist. Goal status: not met-Pt continues to require mod A.  3.  He will show better functional use of left  arm by improved box and blocks Test score from 5 blocks to at least 10 blocks. Goal status:not met but improved 8 blocks  4.  Pt will show ability to microwave instant coffee or small meal safely, to help decrease caregiver burden. Goal status: partially met, pt is able to microwave coffee with supervision, however occasional min A to open containers 5.  He will show improved cognitive ability enough to play a simple game with OT and caregiver. Goal status: approximating goal, pt has played a game of uno at home with family.  6.  Caregiver will state at least 4 ways to decrease perseveration with right arm, also stating that this behaviors at least somewhat better in the home now. Goal status: deferred perseverative behaviors have improved.  7. Pt will donn a flannel button front shirt consistently with min A and min v.c Baseline: mod-max Goal status:  ongoing, min-mod A, min-mod v.c 09/15/22 8.  Pt will use his LUE as an active assist for ADLS grossly [id number] of the time with min v.c Baseline: uses 25% Goal status:  ongoing 09/15/22  9 Pt will stand at the countertop and perform functional activities  incorporating bilateral UE's with supervision and min v.c  Goal status:  ongoing, min assist at time-09/15/22 10. Pt will back up to a chair and sit down squarely at least 1/3 of the time with min v.c  Goal status: ongoing , performs in clinic with min-mod v.c 09/15/22 11..  Pt will open the newspaper and locate sports section with no more than min v.c                          Goal status:  ongoing, min-mod A at times 09/15/22  ASSESSMENT:  CLINICAL IMPRESSION: Pt is progressing towards goals. Pt demonstrated improved  mobility, LUE functional use and attention today.  OT Frequency/DURATION: 2x week x 12 weeks however anticipate d/c after 8 weeks dependent on progress  PLANNED INTERVENTIONS: self care/ADL training, therapeutic exercise, therapeutic activity, neuromuscular re-education, manual therapy, balance training, functional mobility training, moist heat, cryotherapy, patient/family education, cognitive remediation/compensation, visual/perceptual remediation/compensation, psychosocial skills training, energy conservation, coping strategies training, DME and/or AE instructions, and Re-evaluation  RECOMMENDED OTHER SERVICES: He has already seen PT for balance and functional mobility, he would probably benefit from speech for cognitive issues as well, though OT will be working with cognition somewhat functionally now.  CONSULTED AND AGREED WITH PLAN OF CARE: Patient and family member/caregiver  PLAN FOR NEXT SESSION:    Work towards goals, Functional reaching, functional use of bilateral UE's in standing,    Theone Murdoch, OTR/L 9:58 AM 09/16/22

## 2022-09-16 ENCOUNTER — Telehealth: Payer: Self-pay | Admitting: Family Medicine

## 2022-09-16 ENCOUNTER — Ambulatory Visit: Payer: PPO | Admitting: Physical Therapy

## 2022-09-16 ENCOUNTER — Encounter: Payer: Self-pay | Admitting: Physical Therapy

## 2022-09-16 ENCOUNTER — Encounter: Payer: Self-pay | Admitting: Occupational Therapy

## 2022-09-16 ENCOUNTER — Other Ambulatory Visit (HOSPITAL_BASED_OUTPATIENT_CLINIC_OR_DEPARTMENT_OTHER): Payer: Self-pay

## 2022-09-16 DIAGNOSIS — R278 Other lack of coordination: Secondary | ICD-10-CM

## 2022-09-16 DIAGNOSIS — M6281 Muscle weakness (generalized): Secondary | ICD-10-CM

## 2022-09-16 DIAGNOSIS — I69354 Hemiplegia and hemiparesis following cerebral infarction affecting left non-dominant side: Secondary | ICD-10-CM

## 2022-09-16 DIAGNOSIS — R262 Difficulty in walking, not elsewhere classified: Secondary | ICD-10-CM

## 2022-09-16 DIAGNOSIS — Z9181 History of falling: Secondary | ICD-10-CM

## 2022-09-16 MED ORDER — HYDRALAZINE HCL 25 MG PO TABS
25.0000 mg | ORAL_TABLET | Freq: Three times a day (TID) | ORAL | 1 refills | Status: DC
  Filled 2022-09-16: qty 270, 90d supply, fill #0
  Filled 2022-12-12: qty 270, 90d supply, fill #1

## 2022-09-16 NOTE — Addendum Note (Signed)
Addended by: Sanda Linger on: 09/16/2022 01:43 PM   Modules accepted: Orders

## 2022-09-16 NOTE — Telephone Encounter (Signed)
Refill sent.

## 2022-09-16 NOTE — Telephone Encounter (Signed)
Pt's wife called to find out why thehydrALAZINE (APRESOLINE) 25 MG tablet  was denied. Advised the refusal reason was "change not appropriate." [name] said pt takes 1 pill every 8 hours, totaling 3/day. Please send prescription refill to [facility] or call them to advise why the refill cannot be completed.

## 2022-09-16 NOTE — Therapy (Signed)
OUTPATIENT PHYSICAL THERAPY NEURO Treatment   Patient Name: William [name] MRN: [id number] DOB:04-12-1941, 82 y.o., male Today's Date: 09/16/2022  PCP: Lowne Chase, [place] PROVIDER: same  END OF SESSION:  PT End of Session - 09/16/22 1450     Visit Number 20    Date for PT Re-Evaluation 09/29/22    PT Start Time 1445    PT Stop Time 1525    PT Time Calculation (min) 40 min    Equipment Utilized During Treatment Gait belt    Activity Tolerance Patient tolerated treatment well    Behavior During Therapy Impulsive               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   HTN (hypertension) 07/19/2015   Hyperlipidemia    Major neurocognitive disorder 01/09/2014   Mild, related to stroke history   Nocturia    Renal insufficiency 06/25/2013   S/P carotid endarterectomy    BILATERAL ICA--  PATENT PER DUPLEX  05-19-2012   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   Right calf   Urinary frequency    Vitamin D deficiency    Past Surgical History:  Procedure Laterality Date   APPENDECTOMY  AS CHILD   CARDIOVASCULAR STRESS TEST  03-27-2012  DR CRENSHAW   LOW RISK LEXISCAN STUDY-- PROBABLE NORMAL PERFUSION AND SOFT TISSUE ATTENUATION/  NO ISCHEMIA/ EF 51%   CAROTID ENDARTERECTOMY Bilateral LEFT  11-12-2008  DR GREG HAYES   RIGHT ICA  2006  ([facility])   CHOLECYSTECTOMY N/A 02/23/2022   Procedure: LAPAROSCOPIC CHOLECYSTECTOMY;  Surgeon: Felicie Morn, MD;  Location: [place];  Service: General;  Laterality: N/A;   CYSTOSCOPY W/ RETROGRADES  Bilateral 06/22/2021   Procedure: CYSTOSCOPY WITH RETROGRADE PYELOGRAM;  Surgeon: Franchot Gallo, MD;  Location: [place];  Service: Urology;  Laterality: Bilateral;   CYSTOSCOPY WITH LITHOLAPAXY N/A 02/26/2013   Procedure: CYSTOSCOPY WITH LITHOLAPAXY;  Surgeon: Franchot Gallo, MD;  Location: [facility];  Service: Urology;  Laterality: N/A;   ENDOSCOPIC RETROGRADE CHOLANGIOPANCREATOGRAPHY (ERCP) WITH PROPOFOL N/A 02/22/2022   Procedure: ENDOSCOPIC RETROGRADE CHOLANGIOPANCREATOGRAPHY (ERCP) WITH PROPOFOL;  Surgeon: Carol Ada, MD;  Location: [place];  Service: Gastroenterology;  Laterality: N/A;   EYE SURGERY  Jan. 2016   cataract surgery both eyes   INGUINAL HERNIA REPAIR Right 11-08-2006   IR KYPHO EA ADDL LEVEL THORACIC OR LUMBAR  02/12/2021   IR RADIOLOGIST EVAL & MGMT  02/18/2021   MASS EXCISION N/A 03/03/2016   Procedure: EXCISION OF BACK  MASS;  Surgeon: Stark Klein, MD;  Location: [place];  Service: General;  Laterality: N/A;   MOHS SURGERY Left 1/ 2016   Dr Nevada Crane-- Basal cell   PROSTATE SURGERY     REMOVAL OF STONES  02/22/2022   Procedure: REMOVAL OF STONES;  Surgeon: Carol Ada, MD;  Location[facility];  Service: Gastroenterology;;   [name]  02/22/2022   Procedure: [name];  Surgeon: Carol Ada, MD;  Location: [facility]  ENDOSCOPY;  Service: Gastroenterology;;   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-C N/A 06/22/2021   Procedure: TRANSURETHRAL RESECTION OF BLADDER TUMOR;  Surgeon: Franchot Gallo, MD;  Location: [facility];  Service: Urology;  Laterality: N/A;   TRANSURETHRAL RESECTION OF PROSTATE N/A 02/26/2013   Procedure: TRANSURETHRAL RESECTION OF THE PROSTATE WITH GYRUS INSTRUMENTS;  Surgeon: Franchot Gallo, MD;  Location: [facility];  Service: Urology;  Laterality: N/A;   TRANSURETHRAL RESECTION OF PROSTATE N/A 06/22/2021   Procedure: TRANSURETHRAL  RESECTION OF THE PROSTATE (TURP);  Surgeon: Franchot Gallo, MD;  Location: [facility];  Service: Urology;  Laterality: N/A;   Patient Active Problem List   Diagnosis Date Noted   Dysuria 08/03/2022   Nonintractable headache 07/01/2022   Bilateral impacted cerumen 06/24/2022   Rash 06/15/2022   Postoperative ileus 02/27/2022   Ileus, postoperative 02/26/2022   Choledocholithiasis 02/19/2022   DNR (do not resuscitate) 02/19/2022   Left elbow pain 01/26/2022   Mid back pain on left side 01/26/2022   Rib pain 01/26/2022   Acute pain of left shoulder 11/12/2021   Leukocytes in urine 11/12/2021   Urinary frequency 11/12/2021   Thrush 10/08/2021   Hemiplegia, dominant side S/P CVA (cerebrovascular accident) 09/11/2021   Insomnia    Prediabetes    Acute 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    Basal ganglia infarction 07/29/2021   Transaminitis 07/27/2021   UTI (urinary tract infection) 07/27/2021   CVA (cerebral vascular accident) 07/27/2021   Fall 07/27/2021   Hyperglycemia 07/27/2021   Cholelithiasis 07/27/2021   Hypoxia 07/27/2021   Nausea and vomiting [id number]   Acute metabolic encephalopathy [id number]   Normocytic anemia 07/27/2021   Chronic back pain 07/27/2021   Malignant neoplasm of overlapping sites of bladder 06/22/2021   Closed fracture of first lumbar vertebra with routine healing 02/03/2021   Closed fracture of multiple ribs 11/18/2020   Anxiety 01/29/2020   Leg pain, bilateral 01/29/2020   Ingrown toenail 07/13/2019   Lumbar spondylosis 05/02/2018   Pain in left knee 03/09/2018   Osteoarthritis of left hip 01/16/2018   Trochanteric bursitis of left hip 01/16/2018   Preventative health care 09/26/2017   HTN (hypertension) 07/19/2015   Hyperlipidemia 07/19/2015   Great toe pain 02/11/2014   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   Obesity (BMI 30-39.9) 06/25/2013   Renal insufficiency 06/25/2013    Weakness of left arm 06/25/2013   Sebaceous cyst 03/03/2011   Sprain of lumbar region 07/31/2010   Rib pain, left 08/29/2009   Carotid artery stenosis, asymptomatic, bilateral 05/02/2009   Eczema, atopic 05/31/2008   Vitamin D deficiency 03/01/2008   BPH (benign prostatic hyperplasia) 08/06/2007   Fasting hyperglycemia 12/21/2006   History of right MCA infarct 2006    ONSET DATE: 06/29/22  REFERRING DIAG:  Diagnosis  I63.9 (ICD-10-CM) - Cerebrovascular accident (CVA), unspecified mechanism ([place])  W19.XXXD (ICD-10-CM) - Fall, subsequent encounter    THERAPY DIAG:  Muscle weakness (generalized)  Hemiplegia and hemiparesis following cerebral infarction affecting left non-dominant side  Other lack of coordination  Difficulty in walking, not elsewhere classified  History of falling  Rationale for Evaluation and Treatment: Rehabilitation  SUBJECTIVE:  SUBJECTIVE STATEMENT: Patient reports no new falls, "feels great"   PERTINENT HISTORY:  LEODEGARIO WARSHAW is a 82 year old man with dementia, CKD, HTN, CAD, HLD and history of CVA  (2006, 2021), Parkinson's  PAIN:  Are you having pain? No  PRECAUTIONS: Fall  WEIGHT BEARING RESTRICTIONS: No  FALLS: Has patient fallen in last 6 months? No  LIVING ENVIRONMENT: Lives with: lives with their family and lives with their spouse Lives in: House/apartment Stairs:  1 brick high step Has following equipment at home: Environmental consultant - 2 wheeled, Wheelchair (manual), Shower bench, bed side commode, Grab bars, and hospital bed, sliding pad for car  PLOF: Independent with basic ADLs and Independent with household mobility without device  PATIENT GOALS: Walk around the block with his wife. Be able to use the dining room chair rather than have to use the W/C.  Increased I with bed mobility and simple tasks at home like make some coffee or brush his teeth, Step over tub to shower, has bench, but he can't really use it. Up and down the one step to get to back deck.  OBJECTIVE:   DIAGNOSTIC FINDINGS:  MRI 2021 with degenerative changes in lumbar spine, L3-4 R subarticular stenosis  COGNITION: Overall cognitive status: History of cognitive impairments - at baseline   SENSATION: Not tested  COORDINATION: Moderately impaired BLE   MUSCLE LENGTH: Hamstrings: severely restricted B Thomas test: Severely restricted B.  POSTURE: rounded shoulders, forward head, increased thoracic kyphosis, posterior pelvic tilt, and flexed trunk   LOWER EXTREMITY ROM:   BLE extremely stiff throughout, limited hip ROM in all planes, B knees limited in extension.   LOWER EXTREMITY MMT:  3+/5 throughout. Unable to determine accuracy of MMT due to cognition. Functionally demosntrates poor coordinated activation and poor muscular endurance.   BED MOBILITY: Min A for Supine<> Sit. Patient was using a ladder in his hospital bed and could move MI.  TRANSFERS: Min A, mod VC, tends to lean back.  CURB: Min A-Has one step out to his deck.  STAIRS: N/A   GAIT: Gait pattern: step to pattern, decreased arm swing- Right, decreased arm swing- Left, decreased step length- Right, decreased step length- Left, shuffling, festinating, trunk flexed, narrow BOS, poor foot clearance- Right, and poor foot clearance- Left Distance walked: 84' Assistive device utilized: Environmental consultant - 2 wheeled Level of assistance: Occasional min A Comments: mod VC to stay close to walker, take longer steps.  FUNCTIONAL TESTS:  5 times sit to stand: 38.23 Timed up and go (TUG): 47.91  with RW, 07/21/22 TUG 29 seconds without device  TODAY'S TREATMENT:                                                                                                                               DATE:  09/16/22 NuStep L4  x 6 minutes Functional re-assessment for PN-see goals Stair taps with opposite legs, BUE support, x 5 each, CGA Long  steps across air ex planks placed horizontally, UUE support on rail of parallel bars. Repeat with side stepping each direction Standing weight shifts on air ex pad, no UE support, CGA, reluctant to weight shift forward and back. Practiced sit to stand in parallel bars, emphasize forward weight shift, able to rise with CGA Moved to mat to continue forward weight shifts and sit to stand, CGA Ambulated x 50', improved upright posture and foot clearance.  09/14/22 NuStep L5 x75mins UBE L2 24mins forwards and back  Volleyball hits sitting 2x10 each hand  Leg ext 5# 2x10 HS curls10# 2x10 Ball squeezes 2x10 Hip abduction with green band 2x10  09/07/22 Nustep level 5 x 6 minutes  Tmill  MPH CGA x 1 minute Gait outside around the parking island with one rest break Stairs step over step In pbars marching and hip abduction Walking forward and backward in pbars Gait outside in the grass  09/02/22 Nustep level 5 x 5 minutes Walking HHA outside halfway around the parking island and then rest on table, then back in 2.5# on the left ankle having him sit and raise foot up and abduct to a 6" step on the left Bed mobility rolling side to side Bridges, partial sit ups Walk 300 feet with HHA a lot of cues for the step length Ball kicks Volley ball standing and sitting  PATIENT EDUCATION: Education details: POC Person educated: Patient and Spouse Education method: Explanation Education comprehension: verbalized understanding  HOME EXERCISE PROGRAM: TBD  GOALS: Goals reviewed with patient? Yes  SHORT TERM GOALS: Target date: 07/19/22  I with initial HEP Baseline: Goal status: ongoing  2. Decrease 5 x STS to < 25 sec to demonstrate improved functional strength  Baseline: 38 sec  Goal status: 08/09/22 14 sec-met  3. Decrease TUG to < 35 sec to demonstrate improved functional  balance  Baseline: 47 sec  Goal status: met 07/21/22  4. Perform sit <> stand transfers from average chair height with S and VC to be able to use the swivel chair at his dining table.  Baseline: Min A  Goal status: 09/16/22-Inconsistently performs  with S from average height. At times requires CGA to cue forward weight shift. ongoing  LONG TERM GOALS: Target date: 09/29/22  Patient will be able to step over his tub at home using grab bars for stability with CGA to access his shower. Baseline: Unable- has bench, but wife reports he steps over then sits. Goal status: 09/16/22-met  2.  Decrease 5 x STS to < 18 sec to demonstrate improved functional strength Baseline:  Goal status: 15-2/26/24-met  3.  Decrease TUG to < 25 sec to demonstrate improved functional balance Baseline:  Goal status: 08/09/22  19 sec-met  4.  Patient will perform his bed mobility with MI. Baseline: Min A Goal status: 08/09/22- I as demo on mat-met  5.  Patient will ambulate at least 400' with LRAD, CGA, demonstrate improved upright posture, longer step length. Baseline:  Goal status: 09/16/22-Amb >400', but began to fatigue after about 300' with more flexed posture, L foot shuffling more, unable to maintain safe technique. ongoing  6.  Patient will manage up and down a single step with LRAD, CGA to access his deck at home. Baseline: Unable Goal status: 09/16/22-Up and down 1 step with U rail. met  ASSESSMENT:  CLINICAL IMPRESSION: Patient arrived taking very small steps and shuffling. Functional re-assessment performed for PN. Treatment then focused on balance, weight shifts and walking with longer strides. He  was having increased difficutly with sit to stand, so practiced that as well. He did seem to move more easily after treatment.  OBJECTIVE IMPAIRMENTS: Abnormal gait, decreased activity tolerance, decreased balance, decreased cognition, decreased coordination, decreased endurance, decreased mobility, difficulty  walking, decreased ROM, decreased strength, decreased safety awareness, impaired flexibility, impaired UE functional use, improper body mechanics, and postural dysfunction.   ACTIVITY LIMITATIONS: carrying, lifting, bending, sitting, standing, squatting, stairs, transfers, bed mobility, bathing, toileting, dressing, hygiene/grooming, and locomotion level  PARTICIPATION LIMITATIONS: meal prep, cleaning, laundry, interpersonal relationship, shopping, and community activity  PERSONAL FACTORS: Age, Past/current experiences, and 3+ comorbidities: CVA, Parkinson's traits, dementia  are also affecting patient's functional outcome.   REHAB POTENTIAL: Good  CLINICAL DECISION MAKING: Evolving/moderate complexity  EVALUATION COMPLEXITY: Moderate  PLAN:  PT FREQUENCY: 2x/week  PT DURATION: 12 weeks  PLANNED INTERVENTIONS: Therapeutic exercises, Therapeutic activity, Neuromuscular re-education, Balance training, Gait training, Patient/Family education, Self Care, Joint mobilization, Stair training, Moist heat, and Manual therapy  PLAN FOR NEXT SESSION: Trunk and extremity mobilization, balance, strength, functional mobility re-education.  Ethel Rana DPT 09/16/22 3:27 PM  09/16/22 3:27 PM

## 2022-09-20 ENCOUNTER — Other Ambulatory Visit (HOSPITAL_BASED_OUTPATIENT_CLINIC_OR_DEPARTMENT_OTHER): Payer: Self-pay

## 2022-09-20 ENCOUNTER — Ambulatory Visit: Payer: PPO

## 2022-09-20 ENCOUNTER — Other Ambulatory Visit: Payer: Self-pay

## 2022-09-20 DIAGNOSIS — R262 Difficulty in walking, not elsewhere classified: Secondary | ICD-10-CM

## 2022-09-20 DIAGNOSIS — M6281 Muscle weakness (generalized): Secondary | ICD-10-CM | POA: Diagnosis not present

## 2022-09-20 DIAGNOSIS — R278 Other lack of coordination: Secondary | ICD-10-CM

## 2022-09-20 DIAGNOSIS — I69354 Hemiplegia and hemiparesis following cerebral infarction affecting left non-dominant side: Secondary | ICD-10-CM

## 2022-09-20 DIAGNOSIS — Z9181 History of falling: Secondary | ICD-10-CM

## 2022-09-20 NOTE — Therapy (Signed)
OUTPATIENT PHYSICAL THERAPY NEURO Treatment   Patient Name: William [name] MRN: [id number] DOB:January 30, 1941, 82 y.o., male Today's Date: 09/20/2022  PCP: Donato Schultz  DO REFERRING PROVIDER: same  END OF SESSION:  PT End of Session - 09/20/22 1616     Visit Number 21    Date for PT Re-Evaluation 09/29/22    PT Start Time 1519    PT Stop Time 1600    PT Time Calculation (min) 41 min    Activity Tolerance Patient tolerated treatment well    Behavior During Therapy Impulsive                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   HTN (hypertension) 07/19/2015   Hyperlipidemia    Major neurocognitive disorder 01/09/2014   Mild, related to stroke history   Nocturia    Renal insufficiency 06/25/2013   S/P carotid endarterectomy    BILATERAL ICA--  PATENT PER DUPLEX  05-19-2012   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/26/2017   Right calf   Urinary frequency    Vitamin D deficiency    Past Surgical History:  Procedure Laterality Date   APPENDECTOMY  AS CHILD   CARDIOVASCULAR STRESS TEST  03-27-2012  DR CRENSHAW   LOW RISK LEXISCAN STUDY-- PROBABLE NORMAL PERFUSION AND SOFT TISSUE ATTENUATION/  NO ISCHEMIA/ EF 51%   CAROTID ENDARTERECTOMY Bilateral LEFT  11-12-2008  DR GREG HAYES   RIGHT ICA  2006  ([facility])   CHOLECYSTECTOMY N/A 02/23/2022   Procedure: LAPAROSCOPIC CHOLECYSTECTOMY;  Surgeon: Quentin Ore, MD;  Location: [facility];  Service: General;  Laterality: N/A;   CYSTOSCOPY W/ RETROGRADES Bilateral 06/22/2021   Procedure: CYSTOSCOPY WITH  RETROGRADE PYELOGRAM;  Surgeon: Marcine Matar, MD;  Location: [facility];  Service: Urology;  Laterality: Bilateral;   CYSTOSCOPY WITH LITHOLAPAXY N/A 02/26/2013   Procedure: CYSTOSCOPY WITH LITHOLAPAXY;  Surgeon: Marcine Matar, MD;  Location: [facility];  Service: Urology;  Laterality: N/A;   ENDOSCOPIC RETROGRADE CHOLANGIOPANCREATOGRAPHY (ERCP) WITH PROPOFOL N/A 02/22/2022   Procedure: ENDOSCOPIC RETROGRADE CHOLANGIOPANCREATOGRAPHY (ERCP) WITH PROPOFOL;  Surgeon: Jeani Hawking, MD;  Location: [facility];  Service: Gastroenterology;  Laterality: N/A;   EYE SURGERY  Jan. 2016   cataract surgery both eyes   INGUINAL HERNIA REPAIR Right 11-08-2006   IR KYPHO EA ADDL LEVEL THORACIC OR LUMBAR  02/12/2021   IR RADIOLOGIST EVAL & MGMT  02/18/2021   MASS EXCISION N/A 03/03/2016   Procedure: EXCISION OF BACK  MASS;  Surgeon: Almond Lint, MD;  Location: [facility];  Service: General;  Laterality: N/A;   MOHS SURGERY Left 1/ 2016   Dr Margo Aye-- Basal cell   PROSTATE SURGERY     REMOVAL OF STONES  02/22/2022   Procedure: REMOVAL OF STONES;  Surgeon: Jeani Hawking, MD;  Location: [facility];  Service: Gastroenterology;;   Dennison Mascot  02/22/2022   Procedure: Dennison Mascot;  Surgeon: Jeani Hawking, MD;  Location: [facility] ENDOSCOPY;  Service: Gastroenterology;;   TRANSURETHRAL RESECTION  OF BLADDER TUMOR WITH MITOMYCIN-C N/A 06/22/2021   Procedure: TRANSURETHRAL RESECTION OF BLADDER TUMOR;  Surgeon: Marcine Matarahlstedt, Stephen, MD;  [facility];  Service: Urology;  Laterality: N/A;   TRANSURETHRAL RESECTION OF PROSTATE N/A 02/26/2013   Procedure: TRANSURETHRAL RESECTION OF THE PROSTATE WITH GYRUS INSTRUMENTS;  Surgeon: Marcine MatarStephen Dahlstedt, MD;  [facility];  Service: Urology;  Laterality: N/A;   TRANSURETHRAL RESECTION OF PROSTATE N/A 06/22/2021   Procedure: TRANSURETHRAL RESECTION OF THE PROSTATE (TURP);  Surgeon: Marcine Matarahlstedt,  Stephen, MD;  [facility];  Service: Urology;  Laterality: N/A;   Patient Active Problem List   Diagnosis Date Noted   Dysuria 08/03/2022   Nonintractable headache 07/01/2022   Bilateral impacted cerumen 06/24/2022   Rash 06/15/2022   Postoperative ileus 02/27/2022   Ileus, postoperative 02/26/2022   Choledocholithiasis 02/19/2022   DNR (do not resuscitate) 02/19/2022   Left elbow pain 01/26/2022   Mid back pain on left side 01/26/2022   Rib pain 01/26/2022   Acute pain of left shoulder 11/12/2021   Leukocytes in urine 11/12/2021   Urinary frequency 11/12/2021   Thrush 10/08/2021   Hemiplegia, dominant side S/P CVA (cerebrovascular accident) 09/11/2021   Insomnia    Prediabetes    Acute 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    Basal ganglia infarction 07/29/2021   Transaminitis 07/27/2021   UTI (urinary tract infection) 07/27/2021   CVA (cerebral vascular accident) 07/27/2021   Fall 07/27/2021   Hyperglycemia 07/27/2021   Cholelithiasis 07/27/2021   Hypoxia 07/27/2021   Nausea and vomiting 07/27/2021   Acute metabolic encephalopathy 07/27/2021   Normocytic anemia 07/27/2021   Chronic back pain 07/27/2021   Malignant neoplasm of overlapping sites of bladder 06/22/2021   Closed fracture of first lumbar vertebra with routine healing 02/03/2021   Closed fracture of multiple ribs 11/18/2020   Anxiety 01/29/2020   Leg pain, bilateral 01/29/2020   Ingrown toenail 07/13/2019   Lumbar spondylosis 05/02/2018   Pain in left knee 03/09/2018   Osteoarthritis of left hip 01/16/2018   Trochanteric bursitis of left hip 01/16/2018   Preventative health care 09/26/2017   HTN (hypertension) 07/19/2015   Hyperlipidemia 07/19/2015   Great toe pain 02/11/2014   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   Obesity (BMI 30-39.9) 06/25/2013   Renal insufficiency 06/25/2013   Weakness of left arm 06/25/2013   Sebaceous cyst 03/03/2011    Sprain of lumbar region 07/31/2010   Rib pain, left 08/29/2009   Carotid artery stenosis, asymptomatic, bilateral 05/02/2009   Eczema, atopic 05/31/2008   Vitamin D deficiency 03/01/2008   BPH (benign prostatic hyperplasia) 08/06/2007   Fasting hyperglycemia 12/21/2006   History of right MCA infarct 2006    ONSET DATE: 06/29/22  REFERRING DIAG:  Diagnosis  I63.9 (ICD-10-CM) - Cerebrovascular accident (CVA), unspecified mechanism (HCC)  W19.XXXD (ICD-10-CM) - Fall, subsequent encounter    THERAPY DIAG:  Muscle weakness (generalized)  Hemiplegia and hemiparesis following cerebral infarction affecting left non-dominant side  Other lack of coordination  Difficulty in walking, not elsewhere classified  History of falling  Rationale for Evaluation and Treatment: Rehabilitation  SUBJECTIVE:  SUBJECTIVE STATEMENT: Patient reports no new falls, "feels great"   PERTINENT HISTORY:  NOLAN SWANER is a 82 year old man with dementia, CKD, HTN, CAD, HLD and history of CVA  (2006, 2021), Parkinson's  PAIN:  Are you having pain? No  PRECAUTIONS: Fall  WEIGHT BEARING RESTRICTIONS: No  FALLS: Has patient fallen in last 6 months? No  LIVING ENVIRONMENT: Lives with: lives with their family and lives with their spouse Lives in: House/apartment Stairs:  1 brick high step Has following equipment at home: Environmental consultant - 2 wheeled, Wheelchair (manual), Shower bench, bed side commode, Grab bars, and hospital bed, sliding pad for car  PLOF: Independent with basic ADLs and Independent with household mobility without device  PATIENT GOALS: Walk around the block with his wife. Be able to use the dining room chair rather than have to use the W/C. Increased I with bed mobility and simple tasks at home like make  some coffee or brush his teeth, Step over tub to shower, has bench, but he can't really use it. Up and down the one step to get to back deck.  OBJECTIVE:   DIAGNOSTIC FINDINGS:  MRI 2021 with degenerative changes in lumbar spine, L3-4 R subarticular stenosis  COGNITION: Overall cognitive status: History of cognitive impairments - at baseline   SENSATION: Not tested  COORDINATION: Moderately impaired BLE   MUSCLE LENGTH: Hamstrings: severely restricted B Thomas test: Severely restricted B.  POSTURE: rounded shoulders, forward head, increased thoracic kyphosis, posterior pelvic tilt, and flexed trunk   LOWER EXTREMITY ROM:   BLE extremely stiff throughout, limited hip ROM in all planes, B knees limited in extension.   LOWER EXTREMITY MMT:  3+/5 throughout. Unable to determine accuracy of MMT due to cognition. Functionally demosntrates poor coordinated activation and poor muscular endurance.   BED MOBILITY: Min A for Supine<> Sit. Patient was using a ladder in his hospital bed and could move MI.  TRANSFERS: Min A, mod VC, tends to lean back.  CURB: Min A-Has one step out to his deck.  STAIRS: N/A   GAIT: Gait pattern: step to pattern, decreased arm swing- Right, decreased arm swing- Left, decreased step length- Right, decreased step length- Left, shuffling, festinating, trunk flexed, narrow BOS, poor foot clearance- Right, and poor foot clearance- Left Distance walked: 20' Assistive device utilized: Environmental consultant - 2 wheeled Level of assistance: Occasional min A Comments: mod VC to stay close to walker, take longer steps.  FUNCTIONAL TESTS:  5 times sit to stand: 38.23 Timed up and go (TUG): 47.91  with RW, 07/21/22 TUG 29 seconds without device  TODAY'S TREATMENT:                                                                                                                               DATE:  09/20/22: NuStep L4 x 6 minutes Stair taps with opposite legs, BUE support, x 5  each, CGA Long steps across air ex planks  placed horizontally, UUE support on rail of parallel bars, cues to face forward, support weight with R hand on bar.  Repeat with side stepping each direction Practiced sit to stand in parallel bars, emphasize forward weight shift, able to rise with CGA Moved to mat to continue forward weight shifts and sit to stand, CGA Ambulated x 70', with CGA, x 3, in between sessions at ll bars.festinating gait . Seated forward trunk leans, with large therapeutic ball  2 sets 10 reps Seated forward rolls with ball , and press ups, partial standing , 2 sets 5 reps.  09/16/22 NuStep L4 x 6 minutes Functional re-assessment for PN-see goals Stair taps with opposite legs, BUE support, x 5 each, CGA Long steps across air ex planks placed horizontally, UUE support on rail of parallel bars. Repeat with side stepping each direction Standing weight shifts on air ex pad, no UE support, CGA, reluctant to weight shift forward and back. Practiced sit to stand in parallel bars, emphasize forward weight shift, able to rise with CGA Moved to mat to continue forward weight shifts and sit to stand, CGA Ambulated x 50', improved upright posture and foot clearance.  09/14/22 NuStep L5 x79mins UBE L2 forwards and back  Volleyball hits sitting 2x10 each hand  Leg ext 5# 2x10 HS curls10# 2x10 Ball squeezes 2x10 Hip abduction with green band 2x10  09/07/22 Nustep level 5 x 6 minutes  Tmill  MPH CGA x 1 minute Gait outside around the parking William [name] with one rest break Stairs step over step In pbars marching and hip abduction Walking forward and backward in pbars Gait outside in the grass  09/02/22 Nustep level 5 x 5 minutes Walking HHA outside halfway around the parking William [name] and then rest on table, then back in 2.5# on the left ankle having him sit and raise foot up and abduct to a 6" step on the left Bed mobility rolling side to side Bridges, partial sit ups Walk 300  feet with HHA a lot of cues for the step length Ball kicks Volley ball standing and sitting  PATIENT EDUCATION: Education details: POC Person educated: Patient and Spouse Education method: Explanation Education comprehension: verbalized understanding  HOME EXERCISE PROGRAM: TBD  GOALS: Goals reviewed with patient? Yes  SHORT TERM GOALS: Target date: 07/19/22  I with initial HEP Baseline: Goal status: ongoing  2. Decrease 5 x STS to < 25 sec to demonstrate improved functional strength  Baseline: 38 sec  Goal status: 08/09/22 14 sec-met  3. Decrease TUG to < 35 sec to demonstrate improved functional balance  Baseline: 47 sec  Goal status: met 07/21/22  4. Perform sit <> stand transfers from average chair height with S and VC to be able to use the swivel chair at his dining table.  Baseline: Min A  Goal status: 09/16/22-Inconsistently performs  with S from average height. At times requires CGA to cue forward weight shift. ongoing  LONG TERM GOALS: Target date: 09/29/22  Patient will be able to step over his tub at home using grab bars for stability with CGA to access his shower. Baseline: Unable- has bench, but wife reports he steps over then sits. Goal status: 09/16/22-met  2.  Decrease 5 x STS to < 18 sec to demonstrate improved functional strength Baseline:  Goal status: 15-2/26/24-met  3.  Decrease TUG to < 25 sec to demonstrate improved functional balance Baseline:  Goal status: 08/09/22  19 sec-met  4.  Patient will perform his bed mobility  with MI. Baseline: Min A Goal status: 08/09/22- I as demo on mat-met  5.  Patient will ambulate at least 400' with LRAD, CGA, demonstrate improved upright posture, longer step length. Baseline:  Goal status: 09/16/22-Amb >400', but began to fatigue after about 300' with more flexed posture, L foot shuffling more, unable to maintain safe technique. ongoing  6.  Patient will manage up and down a single step with LRAD, CGA to access his  deck at home. Baseline: Unable Goal status: 09/16/22-Up and down 1 step with U rail. met  ASSESSMENT:  CLINICAL IMPRESSION: Patient continues with festinating type gait, also we worked again on sit to stand movements, he is struggling with larger amplitude type movements.   Required consistent CGA to min A with gait and needed seated rest after 70'.  He was very pleasant and cooperative . Is to continue through next week with current plan of care  OBJECTIVE IMPAIRMENTS: Abnormal gait, decreased activity tolerance, decreased balance, decreased cognition, decreased coordination, decreased endurance, decreased mobility, difficulty walking, decreased ROM, decreased strength, decreased safety awareness, impaired flexibility, impaired UE functional use, improper body mechanics, and postural dysfunction.   ACTIVITY LIMITATIONS: carrying, lifting, bending, sitting, standing, squatting, stairs, transfers, bed mobility, bathing, toileting, dressing, hygiene/grooming, and locomotion level  PARTICIPATION LIMITATIONS: meal prep, cleaning, laundry, interpersonal relationship, shopping, and community activity  PERSONAL FACTORS: Age, Past/current experiences, and 3+ comorbidities: CVA, Parkinson's traits, dementia  are also affecting patient's functional outcome.   REHAB POTENTIAL: Good  CLINICAL DECISION MAKING: Evolving/moderate complexity  EVALUATION COMPLEXITY: Moderate  PLAN:  PT FREQUENCY: 2x/week  PT DURATION: 12 weeks  PLANNED INTERVENTIONS: Therapeutic exercises, Therapeutic activity, Neuromuscular re-education, Balance training, Gait training, Patient/Family education, Self Care, Joint mobilization, Stair training, Moist heat, and Manual therapy  PLAN FOR NEXT SESSION: Trunk and extremity mobilization, balance, strength, functional mobility re-education. Ginnie Marich, PT, DPT 09/20/22 4:25 PM  09/20/22 4:25 PM

## 2022-09-21 ENCOUNTER — Encounter: Payer: Self-pay | Admitting: Occupational Therapy

## 2022-09-21 ENCOUNTER — Ambulatory Visit: Payer: PPO | Admitting: Occupational Therapy

## 2022-09-21 DIAGNOSIS — M25512 Pain in left shoulder: Secondary | ICD-10-CM

## 2022-09-21 DIAGNOSIS — R2689 Other abnormalities of gait and mobility: Secondary | ICD-10-CM

## 2022-09-21 DIAGNOSIS — I69354 Hemiplegia and hemiparesis following cerebral infarction affecting left non-dominant side: Secondary | ICD-10-CM

## 2022-09-21 DIAGNOSIS — M6281 Muscle weakness (generalized): Secondary | ICD-10-CM | POA: Diagnosis not present

## 2022-09-21 DIAGNOSIS — I69318 Other symptoms and signs involving cognitive functions following cerebral infarction: Secondary | ICD-10-CM

## 2022-09-21 DIAGNOSIS — R41840 Attention and concentration deficit: Secondary | ICD-10-CM

## 2022-09-21 DIAGNOSIS — R278 Other lack of coordination: Secondary | ICD-10-CM

## 2022-09-21 NOTE — Therapy (Signed)
OUTPATIENT OCCUPATIONAL THERAPY TREATMENT NOTE  Patient Name: William [name] MRN: [id number] DOB:03/16/1941, 82 y.o., male Today's Date: 09/21/2022  PCP & REFERRING PROVIDER:  [name], [name], DO    END OF SESSION:  OT End of Session - 09/21/22 1554     Visit Number 16    Number of Visits 32    Date for OT Re-Evaluation 11/16/22    Authorization Type [employer]    Authorization - Visit Number 16    Progress Note Due on Visit 20    OT Start Time 1448    OT Stop Time 1530    OT Time Calculation (min) 42 min                       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   HTN (hypertension) 07/19/2015   Hyperlipidemia    Major neurocognitive disorder 01/09/2014   Mild, related to stroke history   Nocturia    Renal insufficiency 06/25/2013   S/P carotid endarterectomy    BILATERAL ICA--  PATENT PER DUPLEX  05-19-2012   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/26/2017   Right calf   Urinary frequency    Vitamin D deficiency    Past Surgical History:  Procedure Laterality Date   APPENDECTOMY  AS CHILD   CARDIOVASCULAR STRESS TEST  03-27-2012  DR CRENSHAW   LOW RISK LEXISCAN STUDY-- PROBABLE NORMAL PERFUSION AND SOFT TISSUE ATTENUATION/  NO ISCHEMIA/ EF 51%   CAROTID ENDARTERECTOMY Bilateral LEFT  11-12-2008  DR GREG HAYES   RIGHT ICA  2006  ([facility])   CHOLECYSTECTOMY N/A 02/23/2022   Procedure: LAPAROSCOPIC CHOLECYSTECTOMY;  Surgeon: Quentin Ore, MD;  Location: [facility] OR;  Service: General;  Laterality: N/A;   CYSTOSCOPY W/  RETROGRADES Bilateral 06/22/2021   Procedure: CYSTOSCOPY WITH RETROGRADE PYELOGRAM;  Surgeon: Marcine Matar, MD;  Location: [facility];  Service: Urology;  Laterality: Bilateral;   CYSTOSCOPY WITH LITHOLAPAXY N/A 02/26/2013   Procedure: CYSTOSCOPY WITH LITHOLAPAXY;  Surgeon: Marcine Matar, MD;  Location: [facility];  Service: Urology;  Laterality: N/A;   ENDOSCOPIC RETROGRADE CHOLANGIOPANCREATOGRAPHY (ERCP) WITH PROPOFOL N/A 02/22/2022   Procedure: ENDOSCOPIC RETROGRADE CHOLANGIOPANCREATOGRAPHY (ERCP) WITH PROPOFOL;  Surgeon: Jeani Hawking, MD;  Location: [facility] ENDOSCOPY;  Service: Gastroenterology;  Laterality: N/A;   EYE SURGERY  Jan. 2016   cataract surgery both eyes   INGUINAL HERNIA REPAIR Right 11-08-2006   IR KYPHO EA ADDL LEVEL THORACIC OR LUMBAR  02/12/2021   IR RADIOLOGIST EVAL & MGMT  02/18/2021   MASS EXCISION N/A 03/03/2016   Procedure: EXCISION OF BACK  MASS;  Surgeon: Almond Lint, MD;  Location: [facility];  Service: General;  Laterality: N/A;   MOHS SURGERY Left 1/ 2016   Dr Margo Aye-- Basal cell   PROSTATE SURGERY     REMOVAL OF STONES  02/22/2022   Procedure: REMOVAL OF STONES;  Surgeon: Jeani Hawking, MD;  Location: [facility] ENDOSCOPY;  Service: Gastroenterology;;  SPHINCTEROTOMY  02/22/2022   Procedure: SPHINCTEROTOMY;  Surgeon: Carol Ada, MD;  Location: [facility] ENDOSCOPY;  Service: Gastroenterology;;   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-C N/A 06/22/2021   Procedure: TRANSURETHRAL RESECTION OF BLADDER TUMOR;  Surgeon: Franchot Gallo, MD;  Location: [facility];  Service: Urology;  Laterality: N/A;   TRANSURETHRAL RESECTION OF PROSTATE N/A 02/26/2013   Procedure: TRANSURETHRAL RESECTION OF THE PROSTATE WITH GYRUS INSTRUMENTS;  Surgeon: Franchot Gallo, MD;  Location: [facility];  Service: Urology;  Laterality: N/A;   TRANSURETHRAL RESECTION OF PROSTATE N/A 06/22/2021   Procedure:  TRANSURETHRAL RESECTION OF THE PROSTATE (TURP);  Surgeon: Franchot Gallo, MD;  Location: [facility];  Service: Urology;  Laterality: N/A;   Patient Active Problem List   Diagnosis Date Noted   Dysuria 08/03/2022   Nonintractable headache 07/01/2022   Bilateral impacted cerumen 06/24/2022   Rash 06/15/2022   Postoperative ileus 02/27/2022   Ileus, postoperative 02/26/2022   Choledocholithiasis 02/19/2022   DNR (do not resuscitate) 02/19/2022   Left elbow pain 01/26/2022   Mid back pain on left side 01/26/2022   Rib pain 01/26/2022   Acute pain of left shoulder 11/12/2021   Leukocytes in urine 11/12/2021   Urinary frequency 11/12/2021   Thrush 10/08/2021   Hemiplegia, dominant side S/P CVA (cerebrovascular accident) 09/11/2021   Insomnia    Prediabetes    Acute 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    Basal ganglia infarction 07/29/2021   Transaminitis 07/27/2021   UTI (urinary tract infection) 07/27/2021   CVA (cerebral vascular accident) 07/27/2021   Fall 07/27/2021   Hyperglycemia 07/27/2021   Cholelithiasis 07/27/2021   Hypoxia 07/27/2021   Nausea and vomiting 123XX123   Acute metabolic encephalopathy 123XX123   Normocytic anemia 07/27/2021   Chronic back pain 07/27/2021   Malignant neoplasm of overlapping sites of bladder 06/22/2021   Closed fracture of first lumbar vertebra with routine healing 02/03/2021   Closed fracture of multiple ribs 11/18/2020   Anxiety 01/29/2020   Leg pain, bilateral 01/29/2020   Ingrown toenail 07/13/2019   Lumbar spondylosis 05/02/2018   Pain in left knee 03/09/2018   Osteoarthritis of left hip 01/16/2018   Trochanteric bursitis of left hip 01/16/2018   Preventative health care 09/26/2017   HTN (hypertension) 07/19/2015   Hyperlipidemia 07/19/2015   Great toe pain 02/11/2014   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9 (ICD-10-CM) - Cerebrovascular accident (CVA), unspecified mechanism ([place])  W19.XXXD (ICD-10-CM) - Fall, subsequent encounter    THERAPY DIAG:  Muscle weakness (generalized)  Hemiplegia and hemiparesis following cerebral infarction affecting left non-dominant side  Other lack of coordination  Acute pain of left shoulder  Attention and concentration deficit  Other symptoms and signs involving cognitive functions following cerebral infarction  Other abnormalities of gait and mobility  Rationale for Evaluation and Treatment: Rehabilitation  PERTINENT HISTORY: PMHx includes L  basal ganglia and corona radiata infarct 07/27/21, L lower homonymous quadrantanopia; R MCA infarct in 2006 w/ L-sided hemiparesis, NCD, carotid artery disease (bilateral), HTN, HLD,CKD3, vascular dementia, and hx of bladder cancer. He has had falls in 2023, fracturing his clavicle in August, then he had stomach surgery in Sept 2023. He has He needs assist at baseline for ADLs. When last documented in acute rehab after having his gall-bladder removed, (03/09/22) he required moderate to maximum assist x2 for most ADLs (more assist for ADLs the require standing), and they recommended rehab in skilled nursing setting. He apparently got Covid at some point and also attended a SNF. Once he D/C home, he had some episodes of combativeness at night  with "sun-downing."  He is communicative  and appropriate, though appears easily confused and distracted, not fully oriented to time and place (Ax2, states date is 63 and cannot say what city he lives in). His wife provides most details, and he is participative, though a poor historian at times. In a nut shell- he has been suffering from dementia and post-stroke effects for years, and his issues were recently exacerbated in Sept of last year after abdominal surgery. He has less use of Lt "stroke side" and new, ritualistic "ticks" in mouth and Rt hand/arm.  Wife states that they do have a caregiver come M, W, F for 4 hours in the middle of the day to mainly help him bathe and dress, which usually tires him out. He has been more combative towards the end of the day as well.   PRECAUTIONS: Fall and Other: Lt hemi, poor cognition, LATEX and adhesive allergies, etc.  ;WEIGHT BEARING RESTRICTIONS: No   SUBJECTIVE:   SUBJECTIVE STATEMENT: Pt reports he did well getting his jacket on today  Pt accompanied by:  his wife  PAIN: no  FALLS: Has patient fallen in last 6 months? Yes. Number of falls at least 1 resulting in clavicle fx (now healed)   LIVING ENVIRONMENT: Lives with: lives with their spouse Lives in: House/apartment Has following equipment at home: manual w/c, 2 w/w, weighted utensils, shower bench (for tub), bed cane, hospital bed, raised toilet, and possibly more  PLOF: Needs assistance with ADLs, Needs assistance with homemaking, Needs assistance with gait, and Needs assistance with transfers  PATIENT GOALS: at eval- increase personal hygiene skills (brush teeth, shaving)  prepare instant coffee or microwave meal improve functional cognitive skills to play games, etc.,   increase Lt arm usage try to help decrease Rt hand/arm perseveration (flicking, tapping, noise making) Improve fnl tranfers in/out of multiple seats (tub bench, swivel chair, etc.)    OBJECTIVE: (All objective assessments below are from initial evaluation  on: 07/07/22 unless otherwise specified.)   HAND DOMINANCE: Right  ADLs: Overall ADLs: Wife states decreased ability since hospitalization in Sept 23.  Transfers/ambulation related to ADLs: Min A transfer, CGA for mobility Eating: set up Grooming: Mod A- needs assist with oral care, shaving, but can comb his hair UB Dressing: Min A  LB Dressing: Dep  Toileting:  Mod  Bathing: Mod  Tub Shower transfers: Min A  Equipment: Emergency planning/management officer, Grab bars, and Feeding equipment  IADLs: completely dependent for years now, except some ability to use microwave which is now not possible after hospitalization   MOBILITY STATUS: Needs Assist: CGA - Min A, Hx of falls, Festination, difficulty with turns, and neglect on Lt side  POSTURE COMMENTS:  rounded shoulders, forward head, and weight shift left  ACTIVITY TOLERANCE: Activity  tolerance: he became tired and somewhat agitated by the end of the OT evaluation today (after PT and OT about 2.5hours with mostly sedate activities in seated positions)  FUNCTIONAL OUTCOME MEASURES: [facility] 17/30  showing problems with visuospatial/executive function, attention, fluency, and orientation. (at least 26/30 is needed to be considered "normal")  Trinna Balloon in ADLs: 1/6  (very dependent)  UPPER EXTREMITY ROM:    Active ROM Right eval Left eval  Shoulder scaption 100 85  Shoulder abduction    Shoulder adduction    Shoulder extension    Shoulder internal rotation Reaches small of back Reaches to hip  Shoulder external rotation Reaches back of head  Reaches up to ear  Elbow flexion 150 130  Elbow extension (-10*)  (-35*)  Wrist flexion 35 20  Wrist extension 50 10  (Blank rows = not tested)  UPPER EXTREMITY MMT:    Tested grossly as "push" and "pull": UE push 4-/5, UE pull 4/5  HAND FUNCTION: Grip strength: Right: 30 lbs; Left: 20 lbs  COORDINATION: Finger Nose Finger test: mildly ataxic but fairly accurate  Box and Blocks:  Right  11blocks, Left 5blocks  MUSCLE TONE:  Rt- normal, Lt- decreased and worsened by inattention/disuse  COGNITION: Overall cognitive status: Impaired and Difficulty to assess due to: severity of deficits; Poor attention, fluency, orientation, and executive function. Recall is fairly good as well as abstraction.  Seems to have personality changes due to vascular dementia- like an OCD type volitional perseveration of tapping with Rt hand that he can somewhat control.  He did become angry and agitated at the end of the session performing car transfer with spouse: he did shout at her.  VISION: Subjective report: no new changes per pt/spouse, uses "cheaters"   VISION ASSESSMENT: Visual fields seem intact though he had latent responses on the left side showing likely inattention/and/neglect, tracking somewhat delayed horizontally and vertically  PERCEPTION: Impaired: Inattention/neglect: does not attend to left visual field and does not attend to left side of body, Body scheme: poor quality of recognition with shaving, etc., and Spatial orientation: decreased in print and in orientation, especially to Lt side  PRAXIS: Not tested specifically but no overt/stated issues using pen, wearing glasses ,etc.   OBSERVATIONS: Pt taps and flicks and does seem to volitionally perseverate on making a rhythm/noise with Rt hand. He states he is aware of it and chooses to do it.  He does also seem to have mild resting and intention tremor.    TODAY'S TREATMENT:     DATE: 09/21/22- doffing jacket seated on mat mod difficulty and v.c for getting arms out of sleeves Standing at counter to open various containers using bilateral UE's min-mod v.c for LUE use. Therapist marked [place] container for visual cue for patient, he was able to successfully open without v.c using the visual cue. Functional reaching into cabinets to place and retrieve cones with left and right UE's, mod v.c for LUE use. Functional reaching with graded  clothespins to place on eye level targets, yellow and red with LUE, remainder with RUE for sustained pinch and functional reach, min-mod v.c and demonstration. Seated at table placing large pegs into pegboard, mod/ max difficulty with LUE requiring facilitation, removing pegs with LUE to place in continer. Donning jacket with min A and min v.c in standing. Pt was assisted to car with min A and v.c   09/15/22-Standing at countertop to perform dynamic reaching to left and right sides to targets followed by dynamic functional reaching  to right and left sides using bilateral UE's, min-mod v.c and facilitation. Seated at table, flipping pages and reading articles from newspaper, min-mod v.c and min facilitation for flipping pages,. Pt sustained attention for at least 6 mins at a time during this task. Functional reaching with left and right UE's to place graded clothespins on target, mod difficulty with LUE pinch, min-mod v.c Doffing then donning flannel shirt, mod assist doffing in chair due to positioning, min-mod assist and mod v.c donning in standing.  09/13/22- Seated edge of mat rocking forwards, x 5-10 reps in prep for sit to stands Arts administrator(Therapist modeled [employer], however pt did not uses LUE despite mod v.c and facilitation) Doffing and donning flannel shirt with min A and mod v.c increased time required. Placing large pegs into pegboard with RUE then LUE, mo-max difficulty and facilitation for LUE. Functional reach with LUE to remove pegs from pegboard, min-mod v.c for opening hand. Standing to perform functional reaching for targets on cabinets to facilitate upright posture, mod v.c Opening container to scoop coffee into another container, using bilateral UE's mod-max difficulty opening flip top coffee container. Min -mod difficulty scooping coffee, min-mod v.c for tasks. .    09/09/22- Pt reports neck pain, hotpack applied to neck x approx 20 mins while performing activities in supine no adverse  reactions to heat. Scapular retraction with gentle passive stretch to bilateral shoulders in retraction. Supine closed chain chest press and shoulder flexion, min-mod facilitation/ v.c Followed by functional reaching with trunk rotation to grasp/ release and reach for cones and hand to therapist with right and left UE's, min-mod v.c Pt demonstrates good functional reach with LUE. Seated edge of mat rocking forwards x 5 reps rpior to sit to stand, min v.c  Reading newspaper and flipping pages at table, min v.c, improved performance flipping sections of newpaper today. Pain decreased to a 4/10 end of session.  PATIENT EDUCATION: Education details: See treatment section above for details,  Person educated: Patient and Spouse Education method: Explanation, Demonstration, Tactile cues, and Verbal cues Education comprehension: verbalized understanding, returned demonstration, verbal cues required, tactile cues required, and needs further education  HOME EXERCISE PROGRAM: Access Code: [email or web address] URL: [email or web address] Date: 07/21/2022 Prepared by: Fannie KneeNathanael Moore Exercises - Seated Shoulder Flexion AAROM with Dowel  - 4-6 x daily - 1 sets - 10-15 reps - Seated Shoulder Abduction AAROM with Dowel  - 4-6 x daily - 1 sets - 10-15 reps - Seated Shoulder External Rotation AAROM with Dowel  - 4-6 x daily - 1 sets - 10-15 reps - Seated Bicep Curls with Bar  - 4-6 x daily - 1 sets - 10-15 reps - Seated Shoulder Row with Anchored Resistance  - 4-6 x daily - 1 sets - 10-15 reps     GOALS: Goals reviewed with patient? Yes   SHORT TERM GOALS: (STG required if POC>30 days) Target Date: 09/23/22   Pt will demo/state understanding of initial HEP (or home activity suggestions to maintain function as appropriate) to improve functional skills. Goal status: 07/28/22- Met (addressed in past 2 sessions) 2. Pt will donn a t-shirt consistently with min A and v.c   Goal status: ongoing 3. Pt  will demonstrate ability to open the newspaper    with supervision min v.c Goal status:  ongoing, inconsistent, min A at times 4.  Pt will consistently sustain focus to a simple functional task  in a minimally distracting environment x 6 mins with no more than 1 re-direct(ie: eating, functional task  in clinic)  Goal status:  inconsistent, however pt has been able to sutain attention to newspaper task x 6 mins several times recently 09/15/22 5. Pt will consistently open containers with only supervision and min v.c           Goal status: 09/21/22, improved for coffee container with visual cue, sometimes inconsistent    LONG TERM GOALS: Target Date: 11/16/22  Pt will improve functional ability by decreased impairment per Trinna Balloon assessment from 1/6 to 2/6 or better, for better quality of life. Goal status:  not met 1/6  2.  Caregiver will state his grooming ability/hygiene ability improved from moderate assist to supervision assist. Goal status: not met-Pt continues to require mod A.  3.  He will show better functional use of left arm by improved box and blocks Test score from 5 blocks to at least 10 blocks. Goal status:not met but improved 8 blocks  4.  Pt will show ability to microwave instant coffee or small meal safely, to help decrease caregiver burden. Goal status: partially met, pt is able to microwave coffee with supervision, however occasional min A to open containers 5.  He will show improved cognitive ability enough to play a simple game with OT and caregiver. Goal status: approximating goal, pt has played a game of uno at home with family.  6.  Caregiver will state at least 4 ways to decrease perseveration with right arm, also stating that this behaviors at least somewhat better in the home now. Goal status: deferred perseverative behaviors have improved.  7. Pt will donn a flannel button front shirt consistently with min A and min v.c Baseline: mod-max Goal status:   ongoing, min-mod A, min-mod v.c 09/15/22 8.  Pt will use his LUE as an active assist for ADLS grossly 40% of the time with min v.c Baseline: uses 25% Goal status:  ongoing 09/15/22  9 Pt will stand at the countertop and perform functional activities  incorporating bilateral UE's with supervision and min v.c  Goal status:  ongoing, min assist at time-09/15/22 10. Pt will back up to a chair and sit down squarely at least 1/3 of the time with min v.c  Goal status: ongoing , performs in clinic with min-mod v.c 09/15/22 11..  Pt will open the newspaper and locate sports section with no more than min v.c                          Goal status:  ongoing, min-mod A at times 09/15/22  ASSESSMENT:  CLINICAL IMPRESSION: Pt is progressing towards goals. Pt demonstrates improved ability to open continers today particulary with visual cue for coffee container OT Frequency/DURATION: 2x week x 12 weeks however anticipate d/c after 8 weeks dependent on progress  PLANNED INTERVENTIONS: self care/ADL training, therapeutic exercise, therapeutic activity, neuromuscular re-education, manual therapy, balance training, functional mobility training, moist heat, cryotherapy, patient/family education, cognitive remediation/compensation, visual/perceptual remediation/compensation, psychosocial skills training, energy conservation, coping strategies training, DME and/or AE instructions, and Re-evaluation  RECOMMENDED OTHER SERVICES: He has already seen PT for balance and functional mobility, he would probably benefit from speech for cognitive issues as well, though OT will be working with cognition somewhat functionally now.  CONSULTED AND AGREED WITH PLAN OF CARE: Patient and family member/caregiver  PLAN FOR NEXT SESSION:    Work towards goals, donn Sport and exercise psychologist, Functional reaching, functional use of bilateral UE's in standing,    Allyse Fregeau, OTR/L 3:55 PM 09/21/22

## 2022-09-22 ENCOUNTER — Ambulatory Visit: Payer: PPO | Admitting: Physical Therapy

## 2022-09-22 ENCOUNTER — Encounter: Payer: Self-pay | Admitting: Physical Therapy

## 2022-09-22 DIAGNOSIS — R296 Repeated falls: Secondary | ICD-10-CM

## 2022-09-22 DIAGNOSIS — R278 Other lack of coordination: Secondary | ICD-10-CM

## 2022-09-22 DIAGNOSIS — M6281 Muscle weakness (generalized): Secondary | ICD-10-CM

## 2022-09-22 DIAGNOSIS — M25512 Pain in left shoulder: Secondary | ICD-10-CM

## 2022-09-22 DIAGNOSIS — R262 Difficulty in walking, not elsewhere classified: Secondary | ICD-10-CM

## 2022-09-22 DIAGNOSIS — I69354 Hemiplegia and hemiparesis following cerebral infarction affecting left non-dominant side: Secondary | ICD-10-CM

## 2022-09-22 NOTE — Therapy (Signed)
OUTPATIENT PHYSICAL THERAPY NEURO Treatment   Patient Name: William [name] MRN: [id number] DOB:12-24-1940, 82 y.o., male Today's Date: 09/22/2022  PCP: Donato Schultz  DO REFERRING PROVIDER: same  END OF SESSION:  PT End of Session - 09/22/22 1654     Visit Number 22    Date for PT Re-Evaluation 09/29/22    PT Start Time 1654    PT Stop Time 1743    PT Time Calculation (min) 49 min    Activity Tolerance Patient tolerated treatment well    Behavior During Therapy Impulsive   distracted               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   HTN (hypertension) 07/19/2015   Hyperlipidemia    Major neurocognitive disorder 01/09/2014   Mild, related to stroke history   Nocturia    Renal insufficiency 06/25/2013   S/P carotid endarterectomy    BILATERAL ICA--  PATENT PER DUPLEX  05-19-2012   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   Right calf   Urinary frequency    Vitamin D deficiency    Past Surgical History:  Procedure Laterality Date   APPENDECTOMY  AS CHILD   CARDIOVASCULAR STRESS TEST  03-27-2012  DR CRENSHAW   LOW RISK LEXISCAN STUDY-- PROBABLE NORMAL PERFUSION AND SOFT TISSUE ATTENUATION/  NO ISCHEMIA/ EF 51%   CAROTID ENDARTERECTOMY Bilateral LEFT  11-12-2008  DR GREG HAYES   RIGHT ICA  2006  ([facility])   CHOLECYSTECTOMY N/A 02/23/2022   Procedure: LAPAROSCOPIC CHOLECYSTECTOMY;  Surgeon: Quentin Ore, MD;  Location: [facility] OR;  Service: General;  Laterality: N/A;   CYSTOSCOPY W/ RETROGRADES Bilateral 06/22/2021   Procedure:  CYSTOSCOPY WITH RETROGRADE PYELOGRAM;  Surgeon: Marcine Matar, MD;  Location: [facility];  Service: Urology;  Laterality: Bilateral;   CYSTOSCOPY WITH LITHOLAPAXY N/A 02/26/2013   Procedure: CYSTOSCOPY WITH LITHOLAPAXY;  Surgeon: Marcine Matar, MD;  Location: [facility];  Service: Urology;  Laterality: N/A;   ENDOSCOPIC RETROGRADE CHOLANGIOPANCREATOGRAPHY (ERCP) WITH PROPOFOL N/A 02/22/2022   Procedure: ENDOSCOPIC RETROGRADE CHOLANGIOPANCREATOGRAPHY (ERCP) WITH PROPOFOL;  Surgeon: Jeani Hawking, MD;  Location: [facility] ENDOSCOPY;  Service: Gastroenterology;  Laterality: N/A;   EYE SURGERY  Jan. 2016   cataract surgery both eyes   INGUINAL HERNIA REPAIR Right 11-08-2006   IR KYPHO EA ADDL LEVEL THORACIC OR LUMBAR  02/12/2021   IR RADIOLOGIST EVAL & MGMT  02/18/2021   MASS EXCISION N/A 03/03/2016   Procedure: EXCISION OF BACK  MASS;  Surgeon: Almond Lint, MD;  Location:  SUR[facility]ERY C[facility];  Service: General;  Laterality: N/A;   MOHS SURGERY Left 1/ 2016   Dr Margo Aye-- Basal cell   PROSTATE SURGERY     REMOVAL OF STONES  02/22/2022   Procedure: REMOVAL OF STONES;  Surgeon: Jeani Hawking, MD;  Location: [facility] ENDOSCOPY;  Service: Gastroenterology;;   Dennison Mascot  02/22/2022   Procedure: Dennison Mascot;  Surgeon: Jeani Hawking, MD;  Location: [facility] ENDOSCOPY;  Service: Gastroenterology;;  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-C N/A 06/22/2021   Procedure: TRANSURETHRAL RESECTION OF BLADDER TUMOR;  Surgeon: Marcine Matarahlstedt, Stephen, MD;  [facility] [place];  Service: Urology;  Laterality: N/A;   TRANSURETHRAL RESECTION OF PROSTATE N/A 02/26/2013   Procedure: TRANSURETHRAL RESECTION OF THE PROSTATE WITH GYRUS INSTRUMENTS;  Surgeon: Marcine MatarStephen Dahlstedt, MD;  [facility] [place];  Service: Urology;  Laterality: N/A;   TRANSURETHRAL RESECTION OF PROSTATE N/A 06/22/2021   Procedure: TRANSURETHRAL RESECTION OF THE PROSTATE (TURP);   Surgeon: Marcine Matarahlstedt, Stephen, MD;  [facility] [place];  Service: Urology;  Laterality: N/A;   Patient Active Problem List   Diagnosis Date Noted   Dysuria 08/03/2022   Nonintractable headache 07/01/2022   Bilateral impacted cerumen 06/24/2022   Rash 06/15/2022   Postoperative ileus 02/27/2022   Ileus, postoperative 02/26/2022   Choledocholithiasis 02/19/2022   DNR (do not resuscitate) 02/19/2022   Left elbow pain 01/26/2022   Mid back pain on left side 01/26/2022   Rib pain 01/26/2022   Acute pain of left shoulder 11/12/2021   Leukocytes in urine 11/12/2021   Urinary frequency 11/12/2021   Thrush 10/08/2021   Hemiplegia, dominant side S/P CVA (cerebrovascular accident) 09/11/2021   Insomnia    Prediabetes    Acute 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    Basal ganglia infarction 07/29/2021   Transaminitis 07/27/2021   UTI (urinary tract infection) 07/27/2021   CVA (cerebral vascular accident) 07/27/2021   Fall 07/27/2021   Hyperglycemia 07/27/2021   Cholelithiasis 07/27/2021   Hypoxia 07/27/2021   Nausea and vomiting 07/27/2021   Acute metabolic encephalopathy 07/27/2021   Normocytic anemia 07/27/2021   Chronic back pain 07/27/2021   Malignant neoplasm of overlapping sites of bladder 06/22/2021   Closed fracture of first lumbar vertebra with routine healing 02/03/2021   Closed fracture of multiple ribs 11/18/2020   Anxiety 01/29/2020   Leg pain, bilateral 01/29/2020   Ingrown toenail 07/13/2019   Lumbar spondylosis 05/02/2018   Pain in left knee 03/09/2018   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   Trochanteric bursitis of left hip 01/16/2018   Preventative health care 09/26/2017   HTN (hypertension) 07/19/2015   Hyperlipidemia 07/19/2015   Great toe pain 02/11/2014   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   Obesity (BMI 30-39.9) 06/25/2013   Renal insufficiency 06/25/2013   Weakness of left arm 06/25/2013    Sebaceous cyst 03/03/2011   Sprain of lumbar region 07/31/2010   Rib pain, left 08/29/2009   Carotid artery stenosis, asymptomatic, bilateral 05/02/2009   Eczema, atopic 05/31/2008   Vitamin D deficiency 03/01/2008   BPH (benign prostatic hyperplasia) 08/06/2007   Fasting hyperglycemia 12/21/2006   History of right MCA infarct 2006    ONSET DATE: 06/29/22  REFERRING DIAG:  Diagnosis  I63.9 (ICD-10-CM) - Cerebrovascular accident (CVA), unspecified mechanism (HCC)  W19.XXXD (ICD-10-CM) - Fall, subsequent encounter    THERAPY DIAG:  Muscle weakness (generalized)  Hemiplegia and hemiparesis following cerebral infarction affecting left non-dominant side  Other lack of coordination  Acute pain of left shoulder  Difficulty in walking, not elsewhere classified  Repeated falls  Rationale for Evaluation and Treatment: Rehabilitation  SUBJECTIVE:  SUBJECTIVE STATEMENT: Patient reports no new falls, "feels great", wants to walk outside  PERTINENT HISTORY:  CLEAVEN DEMARIO is a 82 year old man with dementia, CKD, HTN, CAD, HLD and history of CVA  (2006, 2021), Parkinson's  PAIN:  Are you having pain? No  PRECAUTIONS: Fall  WEIGHT BEARING RESTRICTIONS: No  FALLS: Has patient fallen in last 6 months? No  LIVING ENVIRONMENT: Lives with: lives with their family and lives with their spouse Lives in: House/apartment Stairs:  1 brick high step Has following equipment at home: Environmental consultant - 2 wheeled, Wheelchair (manual), Shower bench, bed side commode, Grab bars, and hospital bed, sliding pad for car  PLOF: Independent with basic ADLs and Independent with household mobility without device  PATIENT GOALS: Walk around the block with his wife. Be able to use the dining room chair rather than have to use  the W/C. Increased I with bed mobility and simple tasks at home like make some coffee or brush his teeth, Step over tub to shower, has bench, but he can't really use it. Up and down the one step to get to back deck.  OBJECTIVE:   DIAGNOSTIC FINDINGS:  MRI 2021 with degenerative changes in lumbar spine, L3-4 R subarticular stenosis  COGNITION: Overall cognitive status: History of cognitive impairments - at baseline   SENSATION: Not tested  COORDINATION: Moderately impaired BLE   MUSCLE LENGTH: Hamstrings: severely restricted B Thomas test: Severely restricted B.  POSTURE: rounded shoulders, forward head, increased thoracic kyphosis, posterior pelvic tilt, and flexed trunk   LOWER EXTREMITY ROM:   BLE extremely stiff throughout, limited hip ROM in all planes, B knees limited in extension.   LOWER EXTREMITY MMT:  3+/5 throughout. Unable to determine accuracy of MMT due to cognition. Functionally demosntrates poor coordinated activation and poor muscular endurance.   BED MOBILITY: Min A for Supine<> Sit. Patient was using a ladder in his hospital bed and could move MI.  TRANSFERS: Min A, mod VC, tends to lean back.  CURB: Min A-Has one step out to his deck.  STAIRS: N/A   GAIT: Gait pattern: step to pattern, decreased arm swing- Right, decreased arm swing- Left, decreased step length- Right, decreased step length- Left, shuffling, festinating, trunk flexed, narrow BOS, poor foot clearance- Right, and poor foot clearance- Left Distance walked: 79' Assistive device utilized: Environmental consultant - 2 wheeled Level of assistance: Occasional min A Comments: mod VC to stay close to walker, take longer steps.  FUNCTIONAL TESTS:  5 times sit to stand: 38.23 Timed up and go (TUG): 47.91  with RW, 07/21/22 TUG 29 seconds without device  TODAY'S TREATMENT:                                                                                                                               DATE:   09/22/2022 Nustep level 5 x 5 minutes Bike level 5 x 6 minutes Gait outside 1/2 parking [place] then sit  and rest and then back around, right HHA and use of gait belt Standing straight arm pulls 5# one at a time Sit to stand with weighted ball and a higher level seating surface Standing left leg 4" toe clears Standing with right foot on 4" step for weight bearing and use of the left leg Standing and turning picking up cones and placing them other areas Folding laundry from one surface and turning and putting on another surface, needs some cues to use the left arm   09/20/22: NuStep L4 x 6 minutes Stair taps with opposite legs, BUE support, x 5 each, CGA Long steps across air ex planks placed horizontally, UUE support on rail of parallel bars, cues to face forward, support weight with R hand on bar.  Repeat with side stepping each direction Practiced sit to stand in parallel bars, emphasize forward weight shift, able to rise with CGA Moved to mat to continue forward weight shifts and sit to stand, CGA Ambulated x 70', with CGA, x 3, in between sessions at ll bars.festinating gait . Seated forward trunk leans, with large therapeutic ball  2 sets 10 reps Seated forward rolls with ball , and press ups, partial standing , 2 sets 5 reps.  09/16/22 NuStep L4 x 6 minutes Functional re-assessment for PN-see goals Stair taps with opposite legs, BUE support, x 5 each, CGA Long steps across air ex planks placed horizontally, UUE support on rail of parallel bars. Repeat with side stepping each direction Standing weight shifts on air ex pad, no UE support, CGA, reluctant to weight shift forward and back. Practiced sit to stand in parallel bars, emphasize forward weight shift, able to rise with CGA Moved to mat to continue forward weight shifts and sit to stand, CGA Ambulated x 50', improved upright posture and foot clearance.  09/14/22 NuStep L5 x2mins UBE L2 forwards and back  Volleyball hits  sitting 2x10 each hand  Leg ext 5# 2x10 HS curls10# 2x10 Ball squeezes 2x10 Hip abduction with green band 2x10  09/07/22 Nustep level 5 x 6 minutes  Tmill  MPH CGA x 1 minute Gait outside around the parking [place] with one rest break Stairs step over step In pbars marching and hip abduction Walking forward and backward in pbars Gait outside in the grass  09/02/22 Nustep level 5 x 5 minutes Walking HHA outside halfway around the parking [place] and then rest on table, then back in 2.5# on the left ankle having him sit and raise foot up and abduct to a 6" step on the left Bed mobility rolling side to side Bridges, partial sit ups Walk 300 feet with HHA a lot of cues for the step length Ball kicks Volley ball standing and sitting  PATIENT EDUCATION: Education details: POC Person educated: Patient and Spouse Education method: Explanation Education comprehension: verbalized understanding  HOME EXERCISE PROGRAM: TBD  GOALS: Goals reviewed with patient? Yes  SHORT TERM GOALS: Target date: 07/19/22  I with initial HEP Baseline: Goal status: ongoing  2. Decrease 5 x STS to < 25 sec to demonstrate improved functional strength  Baseline: 38 sec  Goal status: 08/09/22 14 sec-met  3. Decrease TUG to < 35 sec to demonstrate improved functional balance  Baseline: 47 sec  Goal status: met 07/21/22  4. Perform sit <> stand transfers from average chair height with S and VC to be able to use the swivel chair at his dining table.  Baseline: Min A  Goal status: 09/16/22-Inconsistently performs  with S from average height. At times requires CGA to cue forward weight shift. ongoing  LONG TERM GOALS: Target date: 09/29/22  Patient will be able to step over his tub at home using grab bars for stability with CGA to access his shower. Baseline: Unable- has bench, but wife reports he steps over then sits. Goal status: 09/16/22-met  2.  Decrease 5 x STS to < 18 sec to demonstrate improved  functional strength Baseline:  Goal status: 15-2/26/24-met  3.  Decrease TUG to < 25 sec to demonstrate improved functional balance Baseline:  Goal status: 08/09/22  19 sec-met  4.  Patient will perform his bed mobility with MI. Baseline: Min A Goal status: 08/09/22- I as demo on mat-met  5.  Patient will ambulate at least 400' with LRAD, CGA, demonstrate improved upright posture, longer step length. Baseline:  Goal status: 09/16/22-Amb >400', but began to fatigue after about 300' with more flexed posture, L foot shuffling more, unable to maintain safe technique. ongoing  6.  Patient will manage up and down a single step with LRAD, CGA to access his deck at home. Baseline: Unable Goal status: 09/16/22-Up and down 1 step with U rail. met  ASSESSMENT:  CLINICAL IMPRESSION: Patient did better today, I used a right hand hold assist about chest high and this seemed to slow him down and he took bigger/better steps.  He also did well with the towel folding and being able to turn and place the towels, I am thinking it could have been a task that had him concentrate a little more and focus OBJECTIVE IMPAIRMENTS: Abnormal gait, decreased activity tolerance, decreased balance, decreased cognition, decreased coordination, decreased endurance, decreased mobility, difficulty walking, decreased ROM, decreased strength, decreased safety awareness, impaired flexibility, impaired UE functional use, improper body mechanics, and postural dysfunction.   ACTIVITY LIMITATIONS: carrying, lifting, bending, sitting, standing, squatting, stairs, transfers, bed mobility, bathing, toileting, dressing, hygiene/grooming, and locomotion level  PARTICIPATION LIMITATIONS: meal prep, cleaning, laundry, interpersonal relationship, shopping, and community activity  PERSONAL FACTORS: Age, Past/current experiences, and 3+ comorbidities: CVA, Parkinson's traits, dementia  are also affecting patient's functional outcome.   REHAB  POTENTIAL: Good  CLINICAL DECISION MAKING: Evolving/moderate complexity  EVALUATION COMPLEXITY: Moderate  PLAN:  PT FREQUENCY: 2x/week  PT DURATION: 12 weeks  PLANNED INTERVENTIONS: Therapeutic exercises, Therapeutic activity, Neuromuscular re-education, Balance training, Gait training, Patient/Family education, Self Care, Joint mobilization, Stair training, Moist heat, and Manual therapy  PLAN FOR NEXT SESSION: Trunk and extremity mobilization, balance, strength, functional mobility re-education. Stacie Glaze, PT 09/22/22 4:55 PM

## 2022-09-23 ENCOUNTER — Ambulatory Visit: Payer: PPO | Admitting: Occupational Therapy

## 2022-09-23 ENCOUNTER — Encounter: Payer: Self-pay | Admitting: Occupational Therapy

## 2022-09-23 DIAGNOSIS — I69318 Other symptoms and signs involving cognitive functions following cerebral infarction: Secondary | ICD-10-CM

## 2022-09-23 DIAGNOSIS — M6281 Muscle weakness (generalized): Secondary | ICD-10-CM

## 2022-09-23 DIAGNOSIS — I69354 Hemiplegia and hemiparesis following cerebral infarction affecting left non-dominant side: Secondary | ICD-10-CM

## 2022-09-23 DIAGNOSIS — R278 Other lack of coordination: Secondary | ICD-10-CM

## 2022-09-23 DIAGNOSIS — R2689 Other abnormalities of gait and mobility: Secondary | ICD-10-CM

## 2022-09-23 DIAGNOSIS — R41840 Attention and concentration deficit: Secondary | ICD-10-CM

## 2022-09-23 NOTE — Therapy (Signed)
OUTPATIENT OCCUPATIONAL THERAPY TREATMENT NOTE  Patient Name: William [name] MRN: [id number] DOB:[birth year], 82 y.o., male Today's Date: 09/23/2022  PCP & REFERRING PROVIDER:  [name], [name], DO    END OF SESSION:  OT End of Session - 09/23/22 1457     Visit Number 17    Number of Visits 32    Date for OT Re-Evaluation 11/16/22    Authorization Type [employer]    Authorization - Visit Number 17    Progress Note Due on Visit 20    OT Start Time 1315    OT Stop Time 1400    OT Time Calculation (min) 45 min                       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   HTN (hypertension) 07/19/2015   Hyperlipidemia    Major neurocognitive disorder 01/09/2014   Mild, related to stroke history   Nocturia    Renal insufficiency 06/25/2013   S/P carotid endarterectomy    BILATERAL ICA--  PATENT PER DUPLEX  05-19-2012   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   Right calf   Urinary frequency    Vitamin D deficiency    Past Surgical History:  Procedure Laterality Date   APPENDECTOMY  AS CHILD   CARDIOVASCULAR STRESS TEST  03-27-2012  DR CRENSHAW   LOW RISK LEXISCAN STUDY-- PROBABLE NORMAL PERFUSION AND SOFT TISSUE ATTENUATION/  NO ISCHEMIA/ EF 51%   CAROTID ENDARTERECTOMY Bilateral LEFT  11-12-2008  DR GREG HAYES   RIGHT ICA  2006  ([facility])   CHOLECYSTECTOMY N/A 02/23/2022   Procedure: LAPAROSCOPIC CHOLECYSTECTOMY;  Surgeon: Quentin Ore, MD;  Location: [facility] OR;  Service: General;  Laterality: N/A;   CYSTOSCOPY W/  RETROGRADES Bilateral 06/22/2021   Procedure: CYSTOSCOPY WITH RETROGRADE PYELOGRAM;  Surgeon: Marcine Matar, MD;  Location: [facility] [place];  Service: Urology;  Laterality: Bilateral;   CYSTOSCOPY WITH LITHOLAPAXY N/A 02/26/2013   Procedure: CYSTOSCOPY WITH LITHOLAPAXY;  Surgeon: Marcine Matar, MD;  [facility]  Service: Urology;  Laterality: N/A;   ENDOSCOPIC RETROGRADE CHOLANGIOPANCREATOGRAPHY (ERCP) WITH PROPOFOL N/A 02/22/2022   Procedure: ENDOSCOPIC RETROGRADE CHOLANGIOPANCREATOGRAPHY (ERCP) WITH PROPOFOL;  Surgeon: Jeani Hawking, MD;  Location: [facility] ENDOSCOPY;  Service: Gastroenterology;  Laterality: N/A;   EYE SURGERY  Jan. 2016   cataract surgery both eyes   INGUINAL HERNIA REPAIR Right 11-08-2006   IR KYPHO EA ADDL LEVEL THORACIC OR LUMBAR  02/12/2021   IR RADIOLOGIST EVAL & MGMT  02/18/2021   MASS EXCISION N/A 03/03/2016   Procedure: EXCISION OF BACK  MASS;  Surgeon: Almond Lint, MD;  Location: [facility];  Service: General;  Laterality: N/A;   MOHS SURGERY Left 1/ 2016   Dr Margo Aye-- Basal cell   PROSTATE SURGERY     REMOVAL OF STONES  02/22/2022   Procedure: REMOVAL OF STONES;  Surgeon: Jeani Hawking, MD;  Location: [facility] ENDOSCOPY;  Service: Gastroenterology;;  SPHINCTEROTOMY  02/22/2022   Procedure: SPHINCTEROTOMY;  Surgeon: Jeani HawkingHung, Patrick, MD;  [facility];  Service: Gastroenterology;;   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-C N/A 06/22/2021   Procedure: TRANSURETHRAL RESECTION OF BLADDER TUMOR;  Surgeon: Marcine Matarahlstedt, Stephen, MD;  [facility] [place];  Service: Urology;  Laterality: N/A;   TRANSURETHRAL RESECTION OF PROSTATE N/A 02/26/2013   Procedure: TRANSURETHRAL RESECTION OF THE PROSTATE WITH GYRUS INSTRUMENTS;  Surgeon: Marcine MatarStephen Dahlstedt, MD;  [facility] [place];  Service: Urology;  Laterality: N/A;   TRANSURETHRAL RESECTION OF PROSTATE N/A 06/22/2021   Procedure:  TRANSURETHRAL RESECTION OF THE PROSTATE (TURP);  Surgeon: Marcine Matarahlstedt, Stephen, MD;  [facility] [place];  Service: Urology;  Laterality: N/A;   Patient Active Problem List   Diagnosis Date Noted   Dysuria 08/03/2022   Nonintractable headache 07/01/2022   Bilateral impacted cerumen 06/24/2022   Rash 06/15/2022   Postoperative ileus 02/27/2022   Ileus, postoperative 02/26/2022   Choledocholithiasis 02/19/2022   DNR (do not resuscitate) 02/19/2022   Left elbow pain 01/26/2022   Mid back pain on left side 01/26/2022   Rib pain 01/26/2022   Acute pain of left shoulder 11/12/2021   Leukocytes in urine 11/12/2021   Urinary frequency 11/12/2021   Thrush 10/08/2021   Hemiplegia, dominant side S/P CVA (cerebrovascular accident) 09/11/2021   Insomnia    Prediabetes    Acute 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    Basal ganglia infarction 07/29/2021   Transaminitis 07/27/2021   UTI (urinary tract infection) 07/27/2021   CVA (cerebral vascular accident) 07/27/2021   Fall 07/27/2021   Hyperglycemia 07/27/2021   Cholelithiasis 07/27/2021   Hypoxia 07/27/2021   Nausea and vomiting 07/27/2021   Acute metabolic encephalopathy 07/27/2021   Normocytic anemia 07/27/2021   Chronic back pain 07/27/2021   Malignant neoplasm of overlapping sites of bladder 06/22/2021   Closed fracture of first lumbar vertebra with routine healing 02/03/2021   Closed fracture of multiple ribs 11/18/2020   Anxiety 01/29/2020   Leg pain, bilateral 01/29/2020   Ingrown toenail 07/13/2019   Lumbar spondylosis 05/02/2018   Pain in left knee 03/09/2018   Osteoarthritis of left hip 01/16/2018   Trochanteric bursitis of left hip 01/16/2018   Preventative health care 09/26/2017   HTN (hypertension) 07/19/2015   Hyperlipidemia 07/19/2015   Great toe pain 02/11/2014   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9 (ICD-10-CM) - Cerebrovascular accident (CVA), unspecified mechanism (HCC)  W19.XXXD (ICD-10-CM) - Fall, subsequent encounter    THERAPY DIAG:  Muscle weakness (generalized)  Hemiplegia and hemiparesis following cerebral infarction affecting left non-dominant side  Other lack of coordination  Attention and concentration deficit  Other symptoms and signs involving cognitive functions following cerebral infarction  Other abnormalities of gait and mobility  Rationale for Evaluation and Treatment: Rehabilitation  PERTINENT HISTORY: PMHx includes L basal ganglia and corona radiata infarct  07/27/21, L lower homonymous quadrantanopia; R MCA infarct in 2006 w/ L-sided hemiparesis, NCD, carotid artery disease (bilateral), HTN, HLD,CKD3, vascular dementia, and hx of bladder cancer. He has had falls in 2023, fracturing his clavicle in August, then he had stomach surgery in Sept 2023. He has He needs assist at baseline for ADLs. When last documented in acute rehab after having his gall-bladder removed, (03/09/22) he required moderate to maximum assist x2 for most ADLs (more assist for ADLs the require standing), and they recommended rehab in skilled nursing setting. He apparently got Covid at some point and also attended a SNF. Once he D/C home, he had some episodes of combativeness at night  with "sun-downing."  He is communicative and appropriate, though appears  easily confused and distracted, not fully oriented to time and place (Ax2, states date is 76 and cannot say what city he lives in). His wife provides most details, and he is participative, though a poor historian at times. In a nut shell- he has been suffering from dementia and post-stroke effects for years, and his issues were recently exacerbated in Sept of last year after abdominal surgery. He has less use of Lt "stroke side" and new, ritualistic "ticks" in mouth and Rt hand/arm.  Wife states that they do have a caregiver come M, W, F for 4 hours in the middle of the day to mainly help him bathe and dress, which usually tires him out. He has been more combative towards the end of the day as well.   PRECAUTIONS: Fall and Other: Lt hemi, poor cognition, LATEX and adhesive allergies, etc.  ;WEIGHT BEARING RESTRICTIONS: No   SUBJECTIVE:   SUBJECTIVE STATEMENT: I'm good today  Pt accompanied by:  his wife  PAIN: no  FALLS: Has patient fallen in last 6 months? Yes. Number of falls at least 1 resulting in clavicle fx (now healed)   LIVING ENVIRONMENT: Lives with: lives with their spouse Lives in: House/apartment Has following equipment at home: manual w/c, 2 w/w, weighted utensils, shower bench (for tub), bed cane, hospital bed, raised toilet, and possibly more  PLOF: Needs assistance with ADLs, Needs assistance with homemaking, Needs assistance with gait, and Needs assistance with transfers  PATIENT GOALS: at eval- increase personal hygiene skills (brush teeth, shaving)  prepare instant coffee or microwave meal improve functional cognitive skills to play games, etc.,   increase Lt arm usage try to help decrease Rt hand/arm perseveration (flicking, tapping, noise making) Improve fnl tranfers in/out of multiple seats (tub bench, swivel chair, etc.)    OBJECTIVE: (All objective assessments below are from initial evaluation on: 07/07/22 unless otherwise specified.)   HAND DOMINANCE:  Right  ADLs: Overall ADLs: Wife states decreased ability since hospitalization in Sept 23.  Transfers/ambulation related to ADLs: Min A transfer, CGA for mobility Eating: set up Grooming: Mod A- needs assist with oral care, shaving, but can comb his hair UB Dressing: Min A  LB Dressing: Dep  Toileting:  Mod  Bathing: Mod  Tub Shower transfers: Min A  Equipment: Emergency planning/management officer, Grab bars, and Feeding equipment  IADLs: completely dependent for years now, except some ability to use microwave which is now not possible after hospitalization   MOBILITY STATUS: Needs Assist: CGA - Min A, Hx of falls, Festination, difficulty with turns, and neglect on Lt side  POSTURE COMMENTS:  rounded shoulders, forward head, and weight shift left  ACTIVITY TOLERANCE: Activity tolerance: he became tired and somewhat agitated by the end of the OT  evaluation today (after PT and OT about 2.5hours with mostly sedate activities in seated positions)  FUNCTIONAL OUTCOME MEASURES: [facility] 17/30  showing problems with visuospatial/executive function, attention, fluency, and orientation. (at least 26/30 is needed to be considered "normal")  Trinna Balloon in ADLs: 1/6  (very dependent)  UPPER EXTREMITY ROM:    Active ROM Right eval Left eval  Shoulder scaption 100 85  Shoulder abduction    Shoulder adduction    Shoulder extension    Shoulder internal rotation Reaches small of back Reaches to hip  Shoulder external rotation Reaches back of head  Reaches up to ear  Elbow flexion 150 130  Elbow extension (-10*)  (-35*)  Wrist flexion 35 20  Wrist extension 50 10  (Blank rows = not tested)  UPPER EXTREMITY MMT:    Tested grossly as "push" and "pull": UE push 4-/5, UE pull 4/5  HAND FUNCTION: Grip strength: Right: 30 lbs; Left: 20 lbs  COORDINATION: Finger Nose Finger test: mildly ataxic but fairly accurate  Box and Blocks:  Right 11blocks, Left 5blocks  MUSCLE TONE:  Rt- normal, Lt- decreased  and worsened by inattention/disuse  COGNITION: Overall cognitive status: Impaired and Difficulty to assess due to: severity of deficits; Poor attention, fluency, orientation, and executive function. Recall is fairly good as well as abstraction.  Seems to have personality changes due to vascular dementia- like an OCD type volitional perseveration of tapping with Rt hand that he can somewhat control.  He did become angry and agitated at the end of the session performing car transfer with spouse: he did shout at her.  VISION: Subjective report: no new changes per pt/spouse, uses "cheaters"   VISION ASSESSMENT: Visual fields seem intact though he had latent responses on the left side showing likely inattention/and/neglect, tracking somewhat delayed horizontally and vertically  PERCEPTION: Impaired: Inattention/neglect: does not attend to left visual field and does not attend to left side of body, Body scheme: poor quality of recognition with shaving, etc., and Spatial orientation: decreased in print and in orientation, especially to Lt side  PRAXIS: Not tested specifically but no overt/stated issues using pen, wearing glasses ,etc.   OBSERVATIONS: Pt taps and flicks and does seem to volitionally perseverate on making a rhythm/noise with Rt hand. He states he is aware of it and chooses to do it.  He does also seem to have mild resting and intention tremor.    TODAY'S TREATMENT:     DATE: 09/23/22- doffing jacket seated on mat, mod A, v.c Standing at countertop Pt simulated opening OJ container and closing with supervision.  Dynamic functional step and reach to targets on left and right sides, supervision and min v.c Functional reaching inially in standing to remove wooden pegs from pegboard with LUE, mod v.c, due to fatigue pt transitioned to seated.   Replacing pegs into pegboard with trunk rotation and min-mod v.c, min facilitation for LUE use. Closed chain shoulder flexion with ball, mod  failitation/ v.c.  Donning jacket with min A and mod v.c  09/21/22- doffing jacket seated on mat mod difficulty and v.c for getting arms out of sleeves Standing at counter to open various containers using bilateral UE's min-mod v.c for LUE use. Therapist marked [place]gers container for visual cue for patient, he was able to successfully open without v.c using the visual cue. Functional reaching into cabinets to place and retrieve cones with left and right UE's, mod v.c for LUE use. Functional reaching with graded clothespins to place on eye level  targets, yellow and red with LUE, remainder with RUE for sustained pinch and functional reach, min-mod v.c and demonstration. Seated at table placing large pegs into pegboard, mod/ max difficulty with LUE requiring facilitation, removing pegs with LUE to place in continer. Donning jacket with min A and min v.c in standing. Pt was assisted to car with min A and v.c     PATIENT EDUCATION: Education details: See treatment section above for details,  Person educated: Patient and Spouse Education method: Explanation, Demonstration, Tactile cues, and Verbal cues Education comprehension: verbalized understanding, returned demonstration, verbal cues required, tactile cues required, and needs further education  HOME EXERCISE PROGRAM: Access Code: [email or web address] URL: [email or web address] Date: 07/21/2022 Prepared by: Fannie Knee Exercises - Seated Shoulder Flexion AAROM with Dowel  - 4-6 x daily - 1 sets - 10-15 reps - Seated Shoulder Abduction AAROM with Dowel  - 4-6 x daily - 1 sets - 10-15 reps - Seated Shoulder External Rotation AAROM with Dowel  - 4-6 x daily - 1 sets - 10-15 reps - Seated Bicep Curls with Bar  - 4-6 x daily - 1 sets - 10-15 reps - Seated Shoulder Row with Anchored Resistance  - 4-6 x daily - 1 sets - 10-15 reps     GOALS: Goals reviewed with patient? Yes   SHORT TERM GOALS: (STG required if POC>30 days) Target  Date: 09/23/22   Pt will demo/state understanding of initial HEP (or home activity suggestions to maintain function as appropriate) to improve functional skills. Goal status: 07/28/22- Met (addressed in past 2 sessions) 2. Pt will donn a t-shirt consistently with min A and v.c   Goal status: met , per wife's report 3. Pt will demonstrate ability to open the newspaper  with supervision min v.c Goal status:  ongoing, inconsistent, min A at times 4.  Pt will consistently sustain focus to a simple functional task  in a minimally distracting environment x 6 mins with no more than 1 re-direct(ie: eating, functional task in clinic)  Goal status:  inconsistent, however pt has been able to sutain attention to newspaper task x 6 mins several times recently 09/15/22  5. Pt will consistently open containers with only supervision and min v.c           Goal status: ongoing 09/21/22, improved for coffee container with visual cue, sometimes inconsistent    LONG TERM GOALS: Target Date: 11/16/22  Pt will improve functional ability by decreased impairment per Trinna Balloon assessment from 1/6 to 2/6 or better, for better quality of life. Goal status:  not met 1/6  2.  Caregiver will state his grooming ability/hygiene ability improved from moderate assist to supervision assist. Goal status: not met-Pt continues to require mod A.  3.  He will show better functional use of left arm by improved box and blocks Test score from 5 blocks to at least 10 blocks. Goal status:not met but improved 8 blocks  4.  Pt will show ability to microwave instant coffee or small meal safely, to help decrease caregiver burden. Goal status: partially met, pt is able to microwave coffee with supervision, however occasional min A to open containers 5.  He will show improved cognitive ability enough to play a simple game with OT and caregiver. Goal status: approximating goal, pt has played a game of uno at home with family.  6.   Caregiver will state at least 4 ways to decrease perseveration with right arm, also stating that this behaviors at least somewhat better  in the home now. Goal status: deferred perseverative behaviors have improved.  7. Pt will donn a flannel button front shirt consistently with min A and min v.c Baseline: mod-max Goal status:  ongoing, min-mod A, min-mod v.c 09/15/22 8.  Pt will use his LUE as an active assist for ADLS grossly 40% of the time with min v.c Baseline: uses 25% Goal status:  ongoing 09/15/22  9 Pt will stand at the countertop and perform functional activities  incorporating bilateral UE's with supervision and min v.c  Goal status:  ongoing, min assist at time-09/15/22 10. Pt will back up to a chair and sit down squarely at least 1/3 of the time with min v.c  Goal status: ongoing , performs in clinic with min-mod v.c 09/15/22 11..  Pt will open the newspaper and locate sports section with no more than min v.c                          Goal status:  ongoing, min-mod A at times 09/15/22  ASSESSMENT:  CLINICAL IMPRESSION: Pt is progressing towards goals. Pt's wife reports she marked coffee container at home for visual cue and pt has increased ease opening Ily. OT Frequency/DURATION: 2x week x 12 weeks however anticipate d/c after 8 weeks dependent on progress  PLANNED INTERVENTIONS: self care/ADL training, therapeutic exercise, therapeutic activity, neuromuscular re-education, manual therapy, balance training, functional mobility training, moist heat, cryotherapy, patient/family education, cognitive remediation/compensation, visual/perceptual remediation/compensation, psychosocial skills training, energy conservation, coping strategies training, DME and/or AE instructions, and Re-evaluation  RECOMMENDED OTHER SERVICES: He has already seen PT for balance and functional mobility, he would probably benefit from speech for cognitive issues as well, though OT will be working with cognition somewhat  functionally now.  CONSULTED AND AGREED WITH PLAN OF CARE: Patient and family member/caregiver  PLAN FOR NEXT SESSION:    Work towards goals, shaving with electric razor, Functional reaching, functional use of bilateral UE's in standing,    [employer], OTR/L 2:58 PM 09/23/22

## 2022-09-26 ENCOUNTER — Other Ambulatory Visit: Payer: Self-pay | Admitting: Family Medicine

## 2022-09-27 ENCOUNTER — Encounter: Payer: Self-pay | Admitting: Physical Therapy

## 2022-09-27 ENCOUNTER — Ambulatory Visit: Payer: PPO | Admitting: Physical Therapy

## 2022-09-27 DIAGNOSIS — R262 Difficulty in walking, not elsewhere classified: Secondary | ICD-10-CM

## 2022-09-27 DIAGNOSIS — R278 Other lack of coordination: Secondary | ICD-10-CM

## 2022-09-27 DIAGNOSIS — R296 Repeated falls: Secondary | ICD-10-CM

## 2022-09-27 DIAGNOSIS — I69354 Hemiplegia and hemiparesis following cerebral infarction affecting left non-dominant side: Secondary | ICD-10-CM

## 2022-09-27 DIAGNOSIS — M6281 Muscle weakness (generalized): Secondary | ICD-10-CM | POA: Diagnosis not present

## 2022-09-27 NOTE — Therapy (Signed)
OUTPATIENT PHYSICAL THERAPY NEURO Treatment   Patient Name: William [name] MRN: [id number] DOB:June 27, 1940, 82 y.o., male Today's Date: 09/27/2022  PCP: Donato Schultz  DO REFERRING PROVIDER: same  END OF SESSION:  PT End of Session - 09/27/22 1616     Visit Number 23    Date for PT Re-Evaluation 09/29/22    PT Start Time 1613    PT Stop Time 1700    PT Time Calculation (min) 47 min    Equipment Utilized During Treatment Gait belt    Activity Tolerance Patient tolerated treatment well    Behavior During Therapy WFL for tasks assessed/performed                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   HTN (hypertension) 07/19/2015   Hyperlipidemia    Major neurocognitive disorder 01/09/2014   Mild, related to stroke history   Nocturia    Renal insufficiency 06/25/2013   S/P carotid endarterectomy    BILATERAL ICA--  PATENT PER DUPLEX  05-19-2012   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   Right calf   Urinary frequency    Vitamin D deficiency    Past Surgical History:  Procedure Laterality Date   APPENDECTOMY  AS CHILD   CARDIOVASCULAR STRESS TEST  03-27-2012  DR CRENSHAW   LOW RISK LEXISCAN STUDY-- PROBABLE NORMAL PERFUSION AND SOFT TISSUE ATTENUATION/  NO ISCHEMIA/ EF 51%   CAROTID ENDARTERECTOMY Bilateral LEFT  11-12-2008  DR GREG HAYES   RIGHT ICA  2006  ([facility])   CHOLECYSTECTOMY N/A 02/23/2022   Procedure: LAPAROSCOPIC CHOLECYSTECTOMY;  Surgeon: Quentin Ore, MD;  Location: [facility] OR;  Service: General;  Laterality: N/A;    CYSTOSCOPY W/ RETROGRADES Bilateral 06/22/2021   Procedure: CYSTOSCOPY WITH RETROGRADE PYELOGRAM;  Surgeon: Marcine Matar, MD;  Location: [facility];  Service: Urology;  Laterality: Bilateral;   CYSTOSCOPY WITH LITHOLAPAXY N/A 02/26/2013   Procedure: CYSTOSCOPY WITH LITHOLAPAXY;  Surgeon: Marcine Matar, MD;  Location: [facility];  Service: Urology;  Laterality: N/A;   ENDOSCOPIC RETROGRADE CHOLANGIOPANCREATOGRAPHY (ERCP) WITH PROPOFOL N/A 02/22/2022   Procedure: ENDOSCOPIC RETROGRADE CHOLANGIOPANCREATOGRAPHY (ERCP) WITH PROPOFOL;  Surgeon: Jeani Hawking, MD;  Location: [facility] ENDOSCOPY;  Service: Gastroenterology;  Laterality: N/A;   EYE SURGERY  Jan. 2016   cataract surgery both eyes   INGUINAL HERNIA REPAIR Right 11-08-2006   IR KYPHO EA ADDL LEVEL THORACIC OR LUMBAR  02/12/2021   IR RADIOLOGIST EVAL & MGMT  02/18/2021   MASS EXCISION N/A 03/03/2016   Procedure: EXCISION OF BACK  MASS;  Surgeon: Almond Lint, MD;  Location: [facility];  Service: General;  Laterality: N/A;   MOHS SURGERY Left 1/ 2016   Dr Margo Aye-- Basal cell   PROSTATE SURGERY     REMOVAL OF STONES  02/22/2022   Procedure: REMOVAL OF STONES;  Surgeon: Jeani Hawking, MD;  Location: [facility] ENDOSCOPY;  Service: Gastroenterology;;   Dennison Mascot  02/22/2022   Procedure: Dennison Mascot;  Surgeon: Jeani Hawking,  MD;  Location: [facility] ENDOSCOPY;  Service: Gastroenterology;;   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-C N/A 06/22/2021   Procedure: TRANSURETHRAL RESECTION OF BLADDER TUMOR;  Surgeon: Marcine Matar, MD;  Location: [facility];  Service: Urology;  Laterality: N/A;   TRANSURETHRAL RESECTION OF PROSTATE N/A 02/26/2013   Procedure: TRANSURETHRAL RESECTION OF THE PROSTATE WITH GYRUS INSTRUMENTS;  Surgeon: Marcine Matar, MD;  Location: [facility];  Service: Urology;  Laterality: N/A;   TRANSURETHRAL RESECTION OF PROSTATE N/A 06/22/2021    Procedure: TRANSURETHRAL RESECTION OF THE PROSTATE (TURP);  Surgeon: Marcine Matar, MD;  Location: [facility];  Service: Urology;  Laterality: N/A;   Patient Active Problem List   Diagnosis Date Noted   Dysuria 08/03/2022   Nonintractable headache 07/01/2022   Bilateral impacted cerumen 06/24/2022   Rash 06/15/2022   Postoperative ileus 02/27/2022   Ileus, postoperative 02/26/2022   Choledocholithiasis 02/19/2022   DNR (do not resuscitate) 02/19/2022   Left elbow pain 01/26/2022   Mid back pain on left side 01/26/2022   Rib pain 01/26/2022   Acute pain of left shoulder 11/12/2021   Leukocytes in urine 11/12/2021   Urinary frequency 11/12/2021   Thrush 10/08/2021   Hemiplegia, dominant side S/P CVA (cerebrovascular accident) 09/11/2021   Insomnia    Prediabetes    Acute 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    Basal ganglia infarction 07/29/2021   Transaminitis 07/27/2021   UTI (urinary tract infection) 07/27/2021   CVA (cerebral vascular accident) 07/27/2021   Fall 07/27/2021   Hyperglycemia 07/27/2021   Cholelithiasis 07/27/2021   Hypoxia 07/27/2021   Nausea and vomiting 07/27/2021   Acute metabolic encephalopathy 07/27/2021   Normocytic anemia 07/27/2021   Chronic back pain 07/27/2021   Malignant neoplasm of overlapping sites of bladder 06/22/2021   Closed fracture of first lumbar vertebra with routine healing 02/03/2021   Closed fracture of multiple ribs 11/18/2020   Anxiety 01/29/2020   Leg pain, bilateral 01/29/2020   Ingrown toenail 07/13/2019   Lumbar spondylosis 05/02/2018   Pain in left knee 03/09/2018   Osteoarthritis of left hip 01/16/2018   Trochanteric bursitis of left hip 01/16/2018   Preventative health care 09/26/2017   HTN (hypertension) 07/19/2015   Hyperlipidemia 07/19/2015   Great toe pain 02/11/2014   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   Obesity (BMI 30-39.9) 06/25/2013   Renal  insufficiency 06/25/2013   Weakness of left arm 06/25/2013   Sebaceous cyst 03/03/2011   Sprain of lumbar region 07/31/2010   Rib pain, left 08/29/2009   Carotid artery stenosis, asymptomatic, bilateral 05/02/2009   Eczema, atopic 05/31/2008   Vitamin D deficiency 03/01/2008   BPH (benign prostatic hyperplasia) 08/06/2007   Fasting hyperglycemia 12/21/2006   History of right MCA infarct 2006    ONSET DATE: 06/29/22  REFERRING DIAG:  Diagnosis  I63.9 (ICD-10-CM) - Cerebrovascular accident (CVA), unspecified mechanism (HCC)  W19.XXXD (ICD-10-CM) - Fall, subsequent encounter    THERAPY DIAG:  Muscle weakness (generalized)  Hemiplegia and hemiparesis following cerebral infarction affecting left non-dominant side  Other lack of coordination  Difficulty in walking, not elsewhere classified  Repeated falls  Rationale for Evaluation and Treatment: Rehabilitation  SUBJECTIVE:  SUBJECTIVE STATEMENT: wife reports that he had a pretty good weekend.  PERTINENT HISTORY:  VIYAN ROSAMOND is a 82 year old man with dementia, CKD, HTN, CAD, HLD and history of CVA  (2006, 2021), Parkinson's  PAIN:  Are you having pain? No  PRECAUTIONS: Fall  WEIGHT BEARING RESTRICTIONS: No  FALLS: Has patient fallen in last 6 months? No  LIVING ENVIRONMENT: Lives with: lives with their family and lives with their spouse Lives in: House/apartment Stairs:  1 brick high step Has following equipment at home: Environmental consultant - 2 wheeled, Wheelchair (manual), Shower bench, bed side commode, Grab bars, and hospital bed, sliding pad for car  PLOF: Independent with basic ADLs and Independent with household mobility without device  PATIENT GOALS: Walk around the block with his wife. Be able to use the dining room chair rather than  have to use the W/C. Increased I with bed mobility and simple tasks at home like make some coffee or brush his teeth, Step over tub to shower, has bench, but he can't really use it. Up and down the one step to get to back deck.  OBJECTIVE:   DIAGNOSTIC FINDINGS:  MRI 2021 with degenerative changes in lumbar spine, L3-4 R subarticular stenosis  COGNITION: Overall cognitive status: History of cognitive impairments - at baseline   SENSATION: Not tested  COORDINATION: Moderately impaired BLE   MUSCLE LENGTH: Hamstrings: severely restricted B Thomas test: Severely restricted B.  POSTURE: rounded shoulders, forward head, increased thoracic kyphosis, posterior pelvic tilt, and flexed trunk   LOWER EXTREMITY ROM:   BLE extremely stiff throughout, limited hip ROM in all planes, B knees limited in extension.   LOWER EXTREMITY MMT:  3+/5 throughout. Unable to determine accuracy of MMT due to cognition. Functionally demosntrates poor coordinated activation and poor muscular endurance.   BED MOBILITY: Min A for Supine<> Sit. Patient was using a ladder in his hospital bed and could move MI.  TRANSFERS: Min A, mod VC, tends to lean back.  CURB: Min A-Has one step out to his deck.  STAIRS: N/A   GAIT: Gait pattern: step to pattern, decreased arm swing- Right, decreased arm swing- Left, decreased step length- Right, decreased step length- Left, shuffling, festinating, trunk flexed, narrow BOS, poor foot clearance- Right, and poor foot clearance- Left Distance walked: 57' Assistive device utilized: Environmental consultant - 2 wheeled Level of assistance: Occasional min A Comments: mod VC to stay close to walker, take longer steps.  FUNCTIONAL TESTS:  5 times sit to stand: 38.23 Timed up and go (TUG): 47.91  with RW, 07/21/22 TUG 29 seconds without device  TODAY'S TREATMENT:                                                                                                                               DATE:   09/27/22 Nustep level 5 x 5 minutes Gait outside halfway around the parking [place] then rest, then back to this building and  a rest outsie and then in the grass around to the front door with CGA/HHA on the right and some cues for step length Standing in Pbars marching and hip abduction 2x10 each Fwd/bkwd walking in the Pbars Seated volleyball Standing kicks Direction changes Seated 6" clears to practice in and out of car Gait back out to the car  09/22/2022 Nustep level 5 x 5 minutes Bike level 5 x 6 minutes Gait outside 1/2 parking [place] then sit and rest and then back around, right HHA and use of gait belt Standing straight arm pulls 5# one at a time Sit to stand with weighted ball and a higher level seating surface Standing left leg 4" toe clears Standing with right foot on 4" step for weight bearing and use of the left leg Standing and turning picking up cones and placing them other areas Folding laundry from one surface and turning and putting on another surface, needs some cues to use the left arm   09/20/22: NuStep L4 x 6 minutes Stair taps with opposite legs, BUE support, x 5 each, CGA Long steps across air ex planks placed horizontally, UUE support on rail of parallel bars, cues to face forward, support weight with R hand on bar.  Repeat with side stepping each direction Practiced sit to stand in parallel bars, emphasize forward weight shift, able to rise with CGA Moved to mat to continue forward weight shifts and sit to stand, CGA Ambulated x 70', with CGA, x 3, in between sessions at ll bars.festinating gait . Seated forward trunk leans, with large therapeutic ball  2 sets 10 reps Seated forward rolls with ball , and press ups, partial standing , 2 sets 5 reps.  09/16/22 NuStep L4 x 6 minutes Functional re-assessment for PN-see goals Stair taps with opposite legs, BUE support, x 5 each, CGA Long steps across air ex planks placed horizontally, UUE support on rail of  parallel bars. Repeat with side stepping each direction Standing weight shifts on air ex pad, no UE support, CGA, reluctant to weight shift forward and back. Practiced sit to stand in parallel bars, emphasize forward weight shift, able to rise with CGA Moved to mat to continue forward weight shifts and sit to stand, CGA Ambulated x 50', improved upright posture and foot clearance.  09/14/22 NuStep L5 x13mins UBE L2 forwards and back  Volleyball hits sitting 2x10 each hand  Leg ext 5# 2x10 HS curls10# 2x10 Ball squeezes 2x10 Hip abduction with green band 2x10  09/07/22 Nustep level 5 x 6 minutes  Tmill  MPH CGA x 1 minute Gait outside around the parking [place] with one rest break Stairs step over step In pbars marching and hip abduction Walking forward and backward in pbars Gait outside in the grass  09/02/22 Nustep level 5 x 5 minutes Walking HHA outside halfway around the parking [place] and then rest on table, then back in 2.5# on the left ankle having him sit and raise foot up and abduct to a 6" step on the left Bed mobility rolling side to side [place], partial sit ups Walk 300 feet with HHA a lot of cues for the step length Ball kicks Volley ball standing and sitting  PATIENT EDUCATION: Education details: POC Person educated: Patient and Spouse Education method: Explanation Education comprehension: verbalized understanding  HOME EXERCISE PROGRAM: TBD  GOALS: Goals reviewed with patient? Yes  SHORT TERM GOALS: Target date: 07/19/22  I with initial HEP Baseline: Goal status: ongoing  2. Decrease  5 x STS to < 25 sec to demonstrate improved functional strength  Baseline: 38 sec  Goal status: 08/09/22 14 sec-met  3. Decrease TUG to < 35 sec to demonstrate improved functional balance  Baseline: 47 sec  Goal status: met 07/21/22  4. Perform sit <> stand transfers from average chair height with S and VC to be able to use the swivel chair at his dining  table.  Baseline: Min A  Goal status: 09/16/22-Inconsistently performs  with S from average height. At times requires CGA to cue forward weight shift. ongoing  LONG TERM GOALS: Target date: 09/29/22  Patient will be able to step over his tub at home using grab bars for stability with CGA to access his shower. Baseline: Unable- has bench, but wife reports he steps over then sits. Goal status: 09/16/22-met  2.  Decrease 5 x STS to < 18 sec to demonstrate improved functional strength Baseline:  Goal status: 15-2/26/24-met  3.  Decrease TUG to < 25 sec to demonstrate improved functional balance Baseline:  Goal status: 08/09/22  19 sec-met  4.  Patient will perform his bed mobility with MI. Baseline: Min A Goal status: 08/09/22- I as demo on mat-met  5.  Patient will ambulate at least 400' with LRAD, CGA, demonstrate improved upright posture, longer step length. Baseline:  Goal status: 09/16/22-Amb >400', but began to fatigue after about 300' with more flexed posture, L foot shuffling more, unable to maintain safe technique. ongoing  6.  Patient will manage up and down a single step with LRAD, CGA to access his deck at home. Baseline: Unable Goal status: 09/16/22-Up and down 1 step with U rail. met  ASSESSMENT:  CLINICAL IMPRESSION: Patient conitnued to do well today without a lot of the shuffling, it seems that the right HHA seems to help him be upright and not have the shuffles and forward lean.  He does have trouble with exercises that I am trying to get him to go slow and more efficient, instead he will halfway do the rep and then back and do these fast OBJECTIVE IMPAIRMENTS: Abnormal gait, decreased activity tolerance, decreased balance, decreased cognition, decreased coordination, decreased endurance, decreased mobility, difficulty walking, decreased ROM, decreased strength, decreased safety awareness, impaired flexibility, impaired UE functional use, improper body mechanics, and postural  dysfunction.   ACTIVITY LIMITATIONS: carrying, lifting, bending, sitting, standing, squatting, stairs, transfers, bed mobility, bathing, toileting, dressing, hygiene/grooming, and locomotion level  PARTICIPATION LIMITATIONS: meal prep, cleaning, laundry, interpersonal relationship, shopping, and community activity  PERSONAL FACTORS: Age, Past/current experiences, and 3+ comorbidities: CVA, Parkinson's traits, dementia  are also affecting patient's functional outcome.   REHAB POTENTIAL: Good  CLINICAL DECISION MAKING: Evolving/moderate complexity  EVALUATION COMPLEXITY: Moderate  PLAN:  PT FREQUENCY: 2x/week  PT DURATION: 12 weeks  PLANNED INTERVENTIONS: Therapeutic exercises, Therapeutic activity, Neuromuscular re-education, Balance training, Gait training, Patient/Family education, Self Care, Joint mobilization, Stair training, Moist heat, and Manual therapy  PLAN FOR NEXT SESSION: Renewal next vist Stacie Glaze, PT 09/27/22 4:17 PM

## 2022-09-28 ENCOUNTER — Other Ambulatory Visit: Payer: Self-pay | Admitting: Family Medicine

## 2022-09-28 ENCOUNTER — Other Ambulatory Visit (HOSPITAL_BASED_OUTPATIENT_CLINIC_OR_DEPARTMENT_OTHER): Payer: Self-pay

## 2022-09-28 ENCOUNTER — Ambulatory Visit: Payer: PPO | Admitting: Occupational Therapy

## 2022-09-28 ENCOUNTER — Other Ambulatory Visit: Payer: Self-pay

## 2022-09-28 DIAGNOSIS — M6281 Muscle weakness (generalized): Secondary | ICD-10-CM | POA: Diagnosis not present

## 2022-09-28 DIAGNOSIS — R41840 Attention and concentration deficit: Secondary | ICD-10-CM

## 2022-09-28 DIAGNOSIS — I69354 Hemiplegia and hemiparesis following cerebral infarction affecting left non-dominant side: Secondary | ICD-10-CM

## 2022-09-28 DIAGNOSIS — R2689 Other abnormalities of gait and mobility: Secondary | ICD-10-CM

## 2022-09-28 DIAGNOSIS — I69318 Other symptoms and signs involving cognitive functions following cerebral infarction: Secondary | ICD-10-CM

## 2022-09-28 DIAGNOSIS — M25512 Pain in left shoulder: Secondary | ICD-10-CM

## 2022-09-28 DIAGNOSIS — R278 Other lack of coordination: Secondary | ICD-10-CM

## 2022-09-28 MED ORDER — AMANTADINE HCL 100 MG PO CAPS
100.0000 mg | ORAL_CAPSULE | Freq: Every day | ORAL | 1 refills | Status: DC
  Filled 2022-09-28: qty 90, 90d supply, fill #0
  Filled 2022-12-19: qty 90, 90d supply, fill #1

## 2022-09-28 NOTE — Therapy (Signed)
OUTPATIENT OCCUPATIONAL THERAPY TREATMENT NOTE  Patient Name: William [name] MRN: [id number] DOB:[birth year], 82 y.o., male Today's Date: 09/29/2022  PCP & REFERRING PROVIDER:  [name], [name], DO    END OF SESSION:  OT End of Session - 09/28/22 1523     Visit Number 18    Number of Visits 32    Date for OT Re-Evaluation 11/16/22    Authorization Type [employer]    Authorization - Visit Number 18    Progress Note Due on Visit 20    OT Start Time 1526    OT Stop Time 1615    OT Time Calculation (min) 49 min    Activity Tolerance Patient tolerated treatment well    Behavior During Therapy WFL for tasks assessed/performed                        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   HTN (hypertension) 07/19/2015   Hyperlipidemia    Major neurocognitive disorder 01/09/2014   Mild, related to stroke history   Nocturia    Renal insufficiency 06/25/2013   S/P carotid endarterectomy    BILATERAL ICA--  PATENT PER DUPLEX  05-19-2012   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/26/2017   Right calf   Urinary frequency    Vitamin D deficiency    Past Surgical History:  Procedure Laterality Date   APPENDECTOMY  AS CHILD   CARDIOVASCULAR STRESS TEST  03-27-2012  DR CRENSHAW   LOW RISK LEXISCAN STUDY-- PROBABLE NORMAL PERFUSION AND SOFT TISSUE ATTENUATION/  NO ISCHEMIA/ EF 51%   CAROTID ENDARTERECTOMY Bilateral LEFT  11-12-2008  DR GREG HAYES   RIGHT ICA  2006  ([facility])   CHOLECYSTECTOMY N/A 02/23/2022   Procedure: LAPAROSCOPIC  CHOLECYSTECTOMY;  Surgeon: Quentin Ore, MD;  Location: [facility] OR;  Service: General;  Laterality: N/A;   CYSTOSCOPY W/ RETROGRADES Bilateral 06/22/2021   Procedure: CYSTOSCOPY WITH RETROGRADE PYELOGRAM;  Surgeon: Marcine Matar, MD;  Location: [facility];  Service: Urology;  Laterality: Bilateral;   CYSTOSCOPY WITH LITHOLAPAXY N/A 02/26/2013   Procedure: CYSTOSCOPY WITH LITHOLAPAXY;  Surgeon: Marcine Matar, MD;  Location: [facility]  Service: Urology;  Laterality: N/A;   ENDOSCOPIC RETROGRADE CHOLANGIOPANCREATOGRAPHY (ERCP) WITH PROPOFOL N/A 02/22/2022   Procedure: ENDOSCOPIC RETROGRADE CHOLANGIOPANCREATOGRAPHY (ERCP) WITH PROPOFOL;  Surgeon: Jeani Hawking, MD;  Location: [facility]COPY;  Service: Gastroenterology;  Laterality: N/A;   EYE SURGERY  Jan. 2016   cataract surgery both eyes   INGUINAL HERNIA REPAIR Right 11-08-2006   IR KYPHO EA ADDL LEVEL THORACIC OR LUMBAR  02/12/2021   IR RADIOLOGIST EVAL & MGMT  02/18/2021   MASS EXCISION N/A 03/03/2016   Procedure: EXCISION OF BACK  MASS;  Surgeon: Almond Lint, MD;  Location: [facility];  Service: General;  Laterality: N/A;   MOHS SURGERY Left 1/ 2016   Dr Margo Aye-- Basal cell   PROSTATE SURGERY     REMOVAL OF STONES  02/22/2022   Procedure: REMOVAL OF STONES;  Surgeon: Jeani Hawking, MD;  Location: [facility] ENDOSCOPY;  Service: Gastroenterology;;   Dennison Mascot  02/22/2022   Procedure: Dennison Mascot;  Surgeon: Jeani Hawking, MD;  Location: [facility] ENDOSCOPY;  Service: Gastroenterology;;   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-C N/A 06/22/2021   Procedure: TRANSURETHRAL RESECTION OF BLADDER TUMOR;  Surgeon: Marcine Matar, MD;  Location: [facility];  Service: Urology;  Laterality: N/A;   TRANSURETHRAL RESECTION OF PROSTATE N/A 02/26/2013   Procedure: TRANSURETHRAL RESECTION OF THE PROSTATE WITH GYRUS INSTRUMENTS;  Surgeon: Marcine Matar, MD;  Location: [facility];  Service: Urology;  Laterality: N/A;   TRANSURETHRAL RESECTION OF PROSTATE N/A 06/22/2021   Procedure: TRANSURETHRAL RESECTION OF THE PROSTATE (TURP);  Surgeon: Marcine Matar, MD;  Location: [facility];  Service: Urology;  Laterality: N/A;   Patient Active Problem List   Diagnosis Date Noted   Dysuria 08/03/2022   Nonintractable headache 07/01/2022   Bilateral impacted cerumen 06/24/2022   Rash 06/15/2022   Postoperative ileus 02/27/2022   Ileus, postoperative 02/26/2022   Choledocholithiasis 02/19/2022   DNR (do not resuscitate) 02/19/2022   Left elbow pain 01/26/2022   Mid back pain on left side 01/26/2022   Rib pain 01/26/2022   Acute pain of left shoulder 11/12/2021   Leukocytes in urine 11/12/2021   Urinary frequency 11/12/2021   Thrush 10/08/2021   Hemiplegia, dominant side S/P CVA (cerebrovascular accident) 09/11/2021   Insomnia    Prediabetes    Acute 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    Basal ganglia infarction 07/29/2021   Transaminitis 07/27/2021   UTI (urinary tract infection) 07/27/2021   CVA (cerebral vascular accident) 07/27/2021   Fall 07/27/2021   Hyperglycemia 07/27/2021   Cholelithiasis 07/27/2021   Hypoxia 07/27/2021   Nausea and vomiting 07/27/2021   Acute metabolic encephalopathy 07/27/2021   Normocytic anemia 07/27/2021   Chronic back pain 07/27/2021   Malignant neoplasm of overlapping sites of bladder 06/22/2021   Closed fracture of first lumbar vertebra with routine healing 02/03/2021   Closed fracture of multiple ribs 11/18/2020   Anxiety 01/29/2020   Leg pain, bilateral 01/29/2020   Ingrown toenail 07/13/2019   Lumbar spondylosis 05/02/2018   Pain in left knee 03/09/2018   Osteoarthritis of left hip 01/16/2018   Trochanteric bursitis of left hip 01/16/2018   Preventative health care 09/26/2017   HTN (hypertension) 07/19/2015   Hyperlipidemia 07/19/2015   Great toe pain  02/11/2014   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   Obesity (BMI 30-39.9) 06/25/2013   Renal insufficiency 06/25/2013   Weakness of left arm 06/25/2013   Sebaceous cyst 03/03/2011   Sprain of lumbar region 07/31/2010   Rib pain, left 08/29/2009   Carotid artery stenosis, asymptomatic, bilateral 05/02/2009   Eczema, atopic 05/31/2008   Vitamin D deficiency 03/01/2008   BPH (benign prostatic hyperplasia) 08/06/2007   Fasting hyperglycemia 12/21/2006   History of right MCA infarct 2006    ONSET DATE: Acute exacerbation of chronic symptoms (most recent hospitalization 02/26/22 - 03/12/22, followed by SNF and home health)   REFERRING DIAG:  I63.9 (ICD-10-CM) - Cerebrovascular accident (CVA), unspecified mechanism (HCC)  W19.XXXD (ICD-10-CM) - Fall, subsequent encounter    THERAPY DIAG:  Muscle weakness (generalized)  Hemiplegia and hemiparesis following cerebral infarction affecting left non-dominant side  Other lack of coordination  Attention and concentration deficit  Other symptoms and signs involving cognitive functions following cerebral infarction  Acute pain of left  shoulder  Other abnormalities of gait and mobility  Rationale for Evaluation and Treatment: Rehabilitation  PERTINENT HISTORY: PMHx includes L basal ganglia and corona radiata infarct 07/27/21, L lower homonymous quadrantanopia; R MCA infarct in 2006 w/ L-sided hemiparesis, NCD, carotid artery disease (bilateral), HTN, HLD,CKD3, vascular dementia, and hx of bladder cancer. He has had falls in 2023, fracturing his clavicle in August, then he had stomach surgery in Sept 2023. He has He needs assist at baseline for ADLs. When last documented in acute rehab after having his gall-bladder removed, (03/09/22) he required moderate to maximum assist x2 for most ADLs (more assist for ADLs the require standing), and they recommended rehab in skilled nursing setting. He apparently got Covid at some point and also  attended a SNF. Once he D/C home, he had some episodes of combativeness at night  with "sun-downing."  He is communicative and appropriate, though appears easily confused and distracted, not fully oriented to time and place (Ax2, states date is 89 and cannot say what city he lives in). His wife provides most details, and he is participative, though a poor historian at times. In a nut shell- he has been suffering from dementia and post-stroke effects for years, and his issues were recently exacerbated in Sept of last year after abdominal surgery. He has less use of Lt "stroke side" and new, ritualistic "ticks" in mouth and Rt hand/arm.  Wife states that they do have a caregiver come M, W, F for 4 hours in the middle of the day to mainly help him bathe and dress, which usually tires him out. He has been more combative towards the end of the day as well.   PRECAUTIONS: Fall and Other: Lt hemi, poor cognition, LATEX and adhesive allergies, etc.  ;WEIGHT BEARING RESTRICTIONS: No   SUBJECTIVE:   SUBJECTIVE STATEMENT: "Good"  Pt accompanied by:  his wife  PAIN: no  FALLS: Has patient fallen in last 6 months? Yes. Number of falls at least 1 resulting in clavicle fx (now healed)   LIVING ENVIRONMENT: Lives with: lives with their spouse Lives in: House/apartment Has following equipment at home: manual w/c, 2 w/w, weighted utensils, shower bench (for tub), bed cane, hospital bed, raised toilet, and possibly more  PLOF: Needs assistance with ADLs, Needs assistance with homemaking, Needs assistance with gait, and Needs assistance with transfers  PATIENT GOALS: at eval- increase personal hygiene skills (brush teeth, shaving)  prepare instant coffee or microwave meal improve functional cognitive skills to play games, etc.,   increase Lt arm usage try to help decrease Rt hand/arm perseveration (flicking, tapping, noise making) Improve fnl tranfers in/out of multiple seats (tub bench, swivel chair,  etc.)    OBJECTIVE: (All objective assessments below are from initial evaluation on: 07/07/22 unless otherwise specified.)   HAND DOMINANCE: Right  ADLs: Overall ADLs: Wife states decreased ability since hospitalization in Sept 23.  Transfers/ambulation related to ADLs: Min A transfer, CGA for mobility Eating: set up Grooming: Mod A- needs assist with oral care, shaving, but can comb his hair UB Dressing: Min A  LB Dressing: Dep  Toileting:  Mod  Bathing: Mod  Tub Shower transfers: Min A  Equipment: Emergency planning/management officer, Grab bars, and Feeding equipment  IADLs: completely dependent for years now, except some ability to use microwave which is now not possible after hospitalization   MOBILITY STATUS: Needs Assist: CGA - Min A, Hx of falls, Festination, difficulty with turns, and neglect on Lt side  POSTURE COMMENTS:  rounded shoulders, forward head, and weight shift left  ACTIVITY TOLERANCE: Activity tolerance: he became tired and somewhat agitated by the end of the OT evaluation today (after PT and OT about 2.5hours with mostly sedate activities in seated positions)  FUNCTIONAL OUTCOME MEASURES: [facility] 17/30  showing problems with visuospatial/executive function, attention, fluency, and orientation. (at least 26/30 is needed to be considered "normal")  [employer] in ADLs: 1/6  (very dependent)  UPPER EXTREMITY ROM:    Active ROM Right eval Left eval  Shoulder scaption 100 85  Shoulder abduction    Shoulder adduction    Shoulder extension    Shoulder internal rotation Reaches small of back Reaches to hip  Shoulder external rotation Reaches back of head  Reaches up to ear  Elbow flexion 150 130  Elbow extension (-10*)  (-35*)  Wrist flexion 35 20  Wrist extension 50 10  (Blank rows = not tested)  UPPER EXTREMITY MMT:    Tested grossly as "push" and "pull": UE push 4-/5, UE pull 4/5  HAND FUNCTION: Grip strength: Right: 30 lbs; Left: 20 lbs  COORDINATION: Finger  Nose Finger test: mildly ataxic but fairly accurate  Box and Blocks:  Right 11blocks, Left 5blocks  MUSCLE TONE:  Rt- normal, Lt- decreased and worsened by inattention/disuse  COGNITION: Overall cognitive status: Impaired and Difficulty to assess due to: severity of deficits; Poor attention, fluency, orientation, and executive function. Recall is fairly good as well as abstraction.  Seems to have personality changes due to vascular dementia- like an OCD type volitional perseveration of tapping with Rt hand that he can somewhat control.  He did become angry and agitated at the end of the session performing car transfer with spouse: he did shout at her.  VISION: Subjective report: no new changes per pt/spouse, uses "cheaters"   VISION ASSESSMENT: Visual fields seem intact though he had latent responses on the left side showing likely inattention/and/neglect, tracking somewhat delayed horizontally and vertically  PERCEPTION: Impaired: Inattention/neglect: does not attend to left visual field and does not attend to left side of body, Body scheme: poor quality of recognition with shaving, etc., and Spatial orientation: decreased in print and in orientation, especially to Lt side  PRAXIS: Not tested specifically but no overt/stated issues using pen, wearing glasses ,etc.   OBSERVATIONS: Pt taps and flicks and does seem to volitionally perseverate on making a rhythm/noise with Rt hand. He states he is aware of it and chooses to do it.  He does also seem to have mild resting and intention tremor.    TODAY'S TREATMENT:     DATE: 09/28/22 Pt's wife brought in pt's electric toothbrush, tooothpaste and cup.Pt stood at the sink with supervision to apply toothpaste to brush, min facilitation/ v.c for using LUE to stabilize toothbrush, pt required min A , and min v.c intially to open toothpaste. Therapist marked toothpaste for verbal cue and pt opened a second time with increased success. Pt applied toothpaste  to brush and turned brush on to begin brushing teeth. Pt performed the task with mod v.c and min facilitation/ hand over hand assist to slow down brushing speed. Pt was also cued to brush left side and cued to pause when he was noted to be brushing too hard. Pt sat on the mat using a small portable mirror to shave with electric razor. Pt required mod A for task for thoroughness. Pt was cued to shave specific areas, and therapist facilitated pt's hand positioning so that razor was flatter  on face. Therapist assisted pt with shaving mustache area and pt was noted to have increased difficulty this this portion. Seated at table card matching task to match by number or suit, min v.c and mod facilitation for LUE use.  09/23/22- doffing jacket seated on mat, mod A, v.c Standing at countertop Pt simulated opening OJ container and closing with supervision.  Dynamic functional step and reach to targets on left and right sides, supervision and min v.c Functional reaching inially in standing to remove wooden pegs from pegboard with LUE, mod v.c, due to fatigue pt transitioned to seated.   Replacing pegs into pegboard with trunk rotation and min-mod v.c, min facilitation for LUE use. Closed chain shoulder flexion with ball, mod failitation/ v.c.  Donning jacket with min A and mod v.c  09/21/22- doffing jacket seated on mat mod difficulty and v.c for getting arms out of sleeves Standing at counter to open various containers using bilateral UE's min-mod v.c for LUE use. Therapist marked [place]gers container for visual cue for patient, he was able to successfully open without v.c using the visual cue. Functional reaching into cabinets to place and retrieve cones with left and right UE's, mod v.c for LUE use. Functional reaching with graded clothespins to place on eye level targets, yellow and red with LUE, remainder with RUE for sustained pinch and functional reach, min-mod v.c and demonstration. Seated at table placing  large pegs into pegboard, mod/ max difficulty with LUE requiring facilitation, removing pegs with LUE to place in continer. Donning jacket with min A and min v.c in standing. Pt was assisted to car with min A and v.c     PATIENT EDUCATION: Education details: See treatment section above for details,  Person educated: Patient and Spouse Education method: Explanation, Demonstration, Tactile cues, and Verbal cues Education comprehension: verbalized understanding, returned demonstration, verbal cues required, tactile cues required, and needs further education  HOME EXERCISE PROGRAM: Access Code: [email or web address] URL: [email or web address] Date: 07/21/2022 Prepared by: Fannie Knee Exercises - Seated Shoulder Flexion AAROM with Dowel  - 4-6 x daily - 1 sets - 10-15 reps - Seated Shoulder Abduction AAROM with Dowel  - 4-6 x daily - 1 sets - 10-15 reps - Seated Shoulder External Rotation AAROM with Dowel  - 4-6 x daily - 1 sets - 10-15 reps - Seated Bicep Curls with Bar  - 4-6 x daily - 1 sets - 10-15 reps - Seated Shoulder Row with Anchored Resistance  - 4-6 x daily - 1 sets - 10-15 reps     GOALS: Goals reviewed with patient? Yes   SHORT TERM GOALS: (STG required if POC>30 days) Target Date: 09/23/22   Pt will demo/state understanding of initial HEP (or home activity suggestions to maintain function as appropriate) to improve functional skills. Goal status: 07/28/22- Met (addressed in past 2 sessions) 2. Pt will donn a t-shirt consistently with min A and v.c   Goal status: met , per wife's report 3. Pt will demonstrate ability to open the newspaper  with supervision min v.c Goal status:  ongoing, inconsistent, min A at times 4.  Pt will consistently sustain focus to a simple functional task  in a minimally distracting environment x 6 mins with no more than 1 re-direct(ie: eating, functional task in clinic)  Goal status:  inconsistent, however pt has been able to sutain  attention to newspaper task x 6 mins several times recently 09/15/22  5. Pt will consistently open containers with only supervision and min v.c  Goal status: ongoing 09/21/22, improved for coffee container with visual cue, sometimes inconsistent    LONG TERM GOALS: Target Date: 11/16/22  Pt will improve functional ability by decreased impairment per Trinna Balloon assessment from 1/6 to 2/6 or better, for better quality of life. Goal status:  not met 1/6  2.  Caregiver will state his grooming ability/hygiene ability improved from moderate assist to supervision assist. Goal status: not met-Pt continues to require mod A.  3.  He will show better functional use of left arm by improved box and blocks Test score from 5 blocks to at least 10 blocks. Goal status:not met but improved 8 blocks  4.  Pt will show ability to microwave instant coffee or small meal safely, to help decrease caregiver burden. Goal status: partially met, pt is able to microwave coffee with supervision, however occasional min A to open containers 5.  He will show improved cognitive ability enough to play a simple game with OT and caregiver. Goal status: approximating goal, pt has played a game of uno at home with family.  6.  Caregiver will state at least 4 ways to decrease perseveration with right arm, also stating that this behaviors at least somewhat better in the home now. Goal status: deferred perseverative behaviors have improved.  7. Pt will donn a flannel button front shirt consistently with min A and min v.c Baseline: mod-max Goal status:  ongoing, min-mod A, min-mod v.c 09/15/22 8.  Pt will use his LUE as an active assist for ADLS grossly 40% of the time with min v.c Baseline: uses 25% Goal status:  ongoing 09/15/22  9 Pt will stand at the countertop and perform functional activities  incorporating bilateral UE's with supervision and min v.c  Goal status:  ongoing, min assist at time-09/15/22 10. Pt will  back up to a chair and sit down squarely at least 1/3 of the time with min v.c  Goal status: ongoing , performs in clinic with min-mod v.c 09/15/22 11..  Pt will open the newspaper and locate sports section with no more than min v.c                          Goal status:  ongoing, min-mod A at times 09/15/22  ASSESSMENT:  CLINICAL IMPRESSION: Pt is progressing towards goals. Pt demonstrated improved standing balance and tolerance with brushing teeth at sink today. Pt demonstrated improved focus to ADL tasks when pt's wife changed her position, as pt was initially distracted when pt's wife was seated on hi right side. OT Frequency/DURATION: 2x week x 12 weeks however anticipate d/c after 8 weeks dependent on progress  PLANNED INTERVENTIONS: self care/ADL training, therapeutic exercise, therapeutic activity, neuromuscular re-education, manual therapy, balance training, functional mobility training, moist heat, cryotherapy, patient/family education, cognitive remediation/compensation, visual/perceptual remediation/compensation, psychosocial skills training, energy conservation, coping strategies training, DME and/or AE instructions, and Re-evaluation  RECOMMENDED OTHER SERVICES: He has already seen PT for balance and functional mobility, he would probably benefit from speech for cognitive issues as well, though OT will be working with cognition somewhat functionally now.  CONSULTED AND AGREED WITH PLAN OF CARE: Patient and family member/caregiver  PLAN FOR NEXT SESSION:    Work towards goals, continue to address ADL tasks prn,  Functional reaching, functional use of bilateral UE's in standing,    Keene Breath, OTR/L 8:13 AM 09/29/22

## 2022-09-28 NOTE — Telephone Encounter (Signed)
Patient called, medication has not been received at the pharmacy. They would like for it to be resent. Please advise.

## 2022-09-29 ENCOUNTER — Ambulatory Visit: Payer: PPO | Admitting: Physical Therapy

## 2022-09-29 ENCOUNTER — Other Ambulatory Visit (HOSPITAL_BASED_OUTPATIENT_CLINIC_OR_DEPARTMENT_OTHER): Payer: Self-pay

## 2022-09-29 ENCOUNTER — Encounter: Payer: Self-pay | Admitting: Physical Therapy

## 2022-09-29 DIAGNOSIS — M5430 Sciatica, unspecified side: Secondary | ICD-10-CM | POA: Diagnosis not present

## 2022-09-29 DIAGNOSIS — M6281 Muscle weakness (generalized): Secondary | ICD-10-CM

## 2022-09-29 DIAGNOSIS — R0902 Hypoxemia: Secondary | ICD-10-CM | POA: Diagnosis not present

## 2022-09-29 DIAGNOSIS — S32019D Unspecified fracture of first lumbar vertebra, subsequent encounter for fracture with routine healing: Secondary | ICD-10-CM | POA: Diagnosis not present

## 2022-09-29 DIAGNOSIS — R262 Difficulty in walking, not elsewhere classified: Secondary | ICD-10-CM

## 2022-09-29 DIAGNOSIS — G8929 Other chronic pain: Secondary | ICD-10-CM | POA: Diagnosis not present

## 2022-09-29 DIAGNOSIS — I69354 Hemiplegia and hemiparesis following cerebral infarction affecting left non-dominant side: Secondary | ICD-10-CM

## 2022-09-29 DIAGNOSIS — I69359 Hemiplegia and hemiparesis following cerebral infarction affecting unspecified side: Secondary | ICD-10-CM | POA: Diagnosis not present

## 2022-09-29 NOTE — Therapy (Signed)
OUTPATIENT PHYSICAL THERAPY NEURO Treatment   Patient Name: William [name] MRN: [id number] DOB:September 06, 1940, 82 y.o., male Today's Date: 09/29/2022  PCP: Donato Schultz  DO REFERRING PROVIDER: same  END OF SESSION:  PT End of Session - 09/29/22 1540     Visit Number 24    Date for PT Re-Evaluation 10/30/22    PT Start Time 1525    PT Stop Time 1610    PT Time Calculation (min) 45 min    Activity Tolerance Patient tolerated treatment well    Behavior During Therapy [facility] for tasks assessed/performed                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   HTN (hypertension) 07/19/2015   Hyperlipidemia    Major neurocognitive disorder 01/09/2014   Mild, related to stroke history   Nocturia    Renal insufficiency 06/25/2013   S/P carotid endarterectomy    BILATERAL ICA--  PATENT PER DUPLEX  05-19-2012   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/26/2017   Right calf   Urinary frequency    Vitamin D deficiency    Past Surgical History:  Procedure Laterality Date   APPENDECTOMY  AS CHILD   CARDIOVASCULAR STRESS TEST  03-27-2012  DR CRENSHAW   LOW RISK LEXISCAN STUDY-- PROBABLE NORMAL PERFUSION AND SOFT TISSUE ATTENUATION/  NO ISCHEMIA/ EF 51%   CAROTID ENDARTERECTOMY Bilateral LEFT  11-12-2008  DR GREG HAYES   RIGHT ICA  2006  ([facility])   CHOLECYSTECTOMY N/A 02/23/2022   Procedure: LAPAROSCOPIC CHOLECYSTECTOMY;  Surgeon: Quentin Ore, MD;  Location: [facility] OR;  Service: General;  Laterality: N/A;   CYSTOSCOPY W/ RETROGRADES Bilateral 06/22/2021    Procedure: CYSTOSCOPY WITH RETROGRADE PYELOGRAM;  Surgeon: Marcine Matar, MD;  Location: [facility];  Service: Urology;  Laterality: Bilateral;   CYSTOSCOPY WITH LITHOLAPAXY N/A 02/26/2013   Procedure: CYSTOSCOPY WITH LITHOLAPAXY;  Surgeon: Marcine Matar, MD;  Location: [facility];  Service: Urology;  Laterality: N/A;   ENDOSCOPIC RETROGRADE CHOLANGIOPANCREATOGRAPHY (ERCP) WITH PROPOFOL N/A 02/22/2022   Procedure: ENDOSCOPIC RETROGRADE CHOLANGIOPANCREATOGRAPHY (ERCP) WITH PROPOFOL;  Surgeon: Jeani Hawking, MD;  Location: [facility] ENDOSCOPY;  Service: Gastroenterology;  Laterality: N/A;   EYE SURGERY  Jan. 2016   cataract surgery both eyes   INGUINAL HERNIA REPAIR Right 11-08-2006   IR KYPHO EA ADDL LEVEL THORACIC OR LUMBAR  02/12/2021   IR RADIOLOGIST EVAL & MGMT  02/18/2021   MASS EXCISION N/A 03/03/2016   Procedure: EXCISION OF BACK  MASS;  Surgeon: Almond Lint, MD;  Location: [facility];  Service: General;  Laterality: N/A;   MOHS SURGERY Left 1/ 2016   Dr Margo Aye-- Basal cell   PROSTATE SURGERY     REMOVAL OF STONES  02/22/2022   Procedure: REMOVAL OF STONES;  Surgeon: Jeani Hawking, MD;  Location: [facility] ENDOSCOPY;  Service: Gastroenterology;;   Dennison Mascot  02/22/2022   Procedure: Dennison Mascot;  Surgeon: Jeani Hawking, MD;  Location: [facility] ENDOSCOPY;  Service: Gastroenterology;;  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-C N/A 06/22/2021   Procedure: TRANSURETHRAL RESECTION OF BLADDER TUMOR;  Surgeon: Marcine Matar, MD;  Location: [facility];  Service: Urology;  Laterality: N/A;   TRANSURETHRAL RESECTION OF PROSTATE N/A 02/26/2013   Procedure: TRANSURETHRAL RESECTION OF THE PROSTATE WITH GYRUS INSTRUMENTS;  Surgeon: Marcine Matar, MD;  Location: [facility];  Service: Urology;  Laterality: N/A;   TRANSURETHRAL RESECTION OF PROSTATE N/A 06/22/2021   Procedure: TRANSURETHRAL RESECTION OF THE PROSTATE  (TURP);  Surgeon: Marcine Matar, MD;  Location: [facility];  Service: Urology;  Laterality: N/A;   Patient Active Problem List   Diagnosis Date Noted   Dysuria 08/03/2022   Nonintractable headache 07/01/2022   Bilateral impacted cerumen 06/24/2022   Rash 06/15/2022   Postoperative ileus 02/27/2022   Ileus, postoperative 02/26/2022   Choledocholithiasis 02/19/2022   DNR (do not resuscitate) 02/19/2022   Left elbow pain 01/26/2022   Mid back pain on left side 01/26/2022   Rib pain 01/26/2022   Acute pain of left shoulder 11/12/2021   Leukocytes in urine 11/12/2021   Urinary frequency 11/12/2021   Thrush 10/08/2021   Hemiplegia, dominant side S/P CVA (cerebrovascular accident) 09/11/2021   Insomnia    Prediabetes    Acute 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    Basal ganglia infarction 07/29/2021   Transaminitis 07/27/2021   UTI (urinary tract infection) 07/27/2021   CVA (cerebral vascular accident) 07/27/2021   Fall 07/27/2021   Hyperglycemia 07/27/2021   Cholelithiasis 07/27/2021   Hypoxia 07/27/2021   Nausea and vomiting 07/27/2021   Acute metabolic encephalopathy 07/27/2021   Normocytic anemia 07/27/2021   Chronic back pain 07/27/2021   Malignant neoplasm of overlapping sites of bladder 06/22/2021   Closed fracture of first lumbar vertebra with routine healing 02/03/2021   Closed fracture of multiple ribs 11/18/2020   Anxiety 01/29/2020   Leg pain, bilateral 01/29/2020   Ingrown toenail 07/13/2019   Lumbar spondylosis 05/02/2018   Pain in left knee 03/09/2018   Osteoarthritis of left hip 01/16/2018   Trochanteric bursitis of left hip 01/16/2018   Preventative health care 09/26/2017   HTN (hypertension) 07/19/2015   Hyperlipidemia 07/19/2015   Great toe pain 02/11/2014   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   Obesity (BMI 30-39.9) 06/25/2013   Renal insufficiency 06/25/2013   Weakness of left arm 06/25/2013    Sebaceous cyst 03/03/2011   Sprain of lumbar region 07/31/2010   Rib pain, left 08/29/2009   Carotid artery stenosis, asymptomatic, bilateral 05/02/2009   Eczema, atopic 05/31/2008   Vitamin D deficiency 03/01/2008   BPH (benign prostatic hyperplasia) 08/06/2007   Fasting hyperglycemia 12/21/2006   History of right MCA infarct 2006    ONSET DATE: 06/29/22  REFERRING DIAG:  Diagnosis  I63.9 (ICD-10-CM) - Cerebrovascular accident (CVA), unspecified mechanism (HCC)  W19.XXXD (ICD-10-CM) - Fall, subsequent encounter    THERAPY DIAG:  Muscle weakness (generalized)  Hemiplegia and hemiparesis following cerebral infarction affecting left non-dominant side  Difficulty in walking, not elsewhere classified  Rationale for Evaluation and Treatment: Rehabilitation  SUBJECTIVE:  SUBJECTIVE STATEMENT: no falls , has ups and downs, wife reports that he is having a "very good Day"  PERTINENT HISTORY:  William [name] is a 82 year old man with dementia, CKD, HTN, CAD, HLD and history of CVA  (2006, 2021), Parkinson's  PAIN:  Are you having pain? No  PRECAUTIONS: Fall  WEIGHT BEARING RESTRICTIONS: No  FALLS: Has patient fallen in last 6 months? No  LIVING ENVIRONMENT: Lives with: lives with their family and lives with their spouse Lives in: House/apartment Stairs:  1 brick high step Has following equipment at home: Environmental consultant - 2 wheeled, Wheelchair (manual), Shower bench, bed side commode, Grab bars, and hospital bed, sliding pad for car  PLOF: Independent with basic ADLs and Independent with household mobility without device  PATIENT GOALS: Walk around the block with his wife. Be able to use the dining room chair rather than have to use the W/C. Increased I with bed mobility and simple tasks at home  like make some coffee or brush his teeth, Step over tub to shower, has bench, but he can't really use it. Up and down the one step to get to back deck.  OBJECTIVE:   DIAGNOSTIC FINDINGS:  MRI 2021 with degenerative changes in lumbar spine, L3-4 R subarticular stenosis  COGNITION: Overall cognitive status: History of cognitive impairments - at baseline   SENSATION: Not tested  COORDINATION: Moderately impaired BLE   MUSCLE LENGTH: Hamstrings: severely restricted B Thomas test: Severely restricted B.  POSTURE: rounded shoulders, forward head, increased thoracic kyphosis, posterior pelvic tilt, and flexed trunk   LOWER EXTREMITY ROM:   BLE extremely stiff throughout, limited hip ROM in all planes, B knees limited in extension.   LOWER EXTREMITY MMT:  3+/5 throughout. Unable to determine accuracy of MMT due to cognition. Functionally demosntrates poor coordinated activation and poor muscular endurance.   BED MOBILITY: Min A for Supine<> Sit. Patient was using a ladder in his hospital bed and could move MI.  TRANSFERS: Min A, mod VC, tends to lean back.  CURB: Min A-Has one step out to his deck.  STAIRS: N/A   GAIT: Gait pattern: step to pattern, decreased arm swing- Right, decreased arm swing- Left, decreased step length- Right, decreased step length- Left, shuffling, festinating, trunk flexed, narrow BOS, poor foot clearance- Right, and poor foot clearance- Left Distance walked: 43' Assistive device utilized: Environmental consultant - 2 wheeled Level of assistance: Occasional min A Comments: mod VC to stay close to walker, take longer steps.  FUNCTIONAL TESTS:  5 times sit to stand: 38.23  09/29/22  could not performs 5XSTS Timed up and go (TUG): 47.91  with RW, 07/21/22 TUG 29  seconds without device, 09/29/22 25 seconds  TODAY'S TREATMENT:                                                                                                                               DATE:  09/29/22 Gait  outside  total 600 feet with 2 rests some walking in grass, uneven terrain See TUG, 5X STS above Worked on bed mobility and on our harder surfaces he is independent with in and out of bed and can position himself Volleyball  Side stepping, backward walking  09/27/22 Nustep level 5 x 5 minutes Gait outside halfway around the parking [place] then rest, then back to this building and a rest outsie and then in the grass around to the front door with CGA/HHA on the right and some cues for step length Standing in Pbars marching and hip abduction 2x10 each Fwd/bkwd walking in the Pbars Seated volleyball Standing kicks Direction changes Seated 6" clears to practice in and out of car Gait back out to the car  09/22/2022 Nustep level 5 x 5 minutes Bike level 5 x 6 minutes Gait outside 1/2 parking [place] then sit and rest and then back around, right HHA and use of gait belt Standing straight arm pulls 5# one at a time Sit to stand with weighted ball and a higher level seating surface Standing left leg 4" toe clears Standing with right foot on 4" step for weight bearing and use of the left leg Standing and turning picking up cones and placing them other areas Folding laundry from one surface and turning and putting on another surface, needs some cues to use the left arm   09/20/22: NuStep L4 x 6 minutes Stair taps with opposite legs, BUE support, x 5 each, CGA Long steps across air ex planks placed horizontally, UUE support on rail of parallel bars, cues to face forward, support weight with R hand on bar.  Repeat with side stepping each direction Practiced sit to stand in parallel bars, emphasize forward weight shift, able to rise with CGA Moved to mat to continue forward weight shifts and sit to stand, CGA Ambulated x 70', with CGA, x 3, in between sessions at ll bars.festinating gait . Seated forward trunk leans, with large therapeutic ball  2 sets 10 reps Seated forward rolls with ball , and  press ups, partial standing , 2 sets 5 reps.  09/16/22 NuStep L4 x 6 minutes Functional re-assessment for PN-see goals Stair taps with opposite legs, BUE support, x 5 each, CGA Long steps across air ex planks placed horizontally, UUE support on rail of parallel bars. Repeat with side stepping each direction Standing weight shifts on air ex pad, no UE support, CGA, reluctant to weight shift forward and back. Practiced sit to stand in parallel bars, emphasize forward weight shift, able to rise with CGA Moved to mat to continue forward weight shifts and sit to stand, CGA Ambulated x 50', improved upright posture and foot clearance.  09/14/22 NuStep L5 x10mins UBE L2 forwards and back  Volleyball hits sitting 2x10 each hand  Leg ext 5# 2x10 HS curls10# 2x10 Ball squeezes 2x10 Hip abduction with green band 2x10  09/07/22 Nustep level 5 x 6 minutes  Tmill  MPH CGA x 1 minute Gait outside around the parking [place] with one rest break Stairs step over step In pbars marching and hip abduction Walking forward and backward in pbars Gait outside in the grass  09/02/22 Nustep level 5 x 5 minutes Walking HHA outside halfway around the parking [place] and then rest on table, then back in 2.5# on the left ankle having him sit and raise foot up and abduct to a 6" step on the left Bed mobility rolling side to side Bridges, partial sit ups Walk  300 feet with HHA a lot of cues for the step length Ball kicks Volley ball standing and sitting  PATIENT EDUCATION: Education details: POC Person educated: Patient and Spouse Education method: Explanation Education comprehension: verbalized understanding  HOME EXERCISE PROGRAM: TBD  GOALS: Goals reviewed with patient? Yes  SHORT TERM GOALS: Target date: 07/19/22  I with initial HEP Baseline: Goal status: ongoing  2. Decrease 5 x STS to < 25 sec to demonstrate improved functional strength  Baseline: 38 sec  Goal status: 08/09/22 14  sec-met  3. Decrease TUG to < 35 sec to demonstrate improved functional balance  Baseline: 47 sec  Goal status: 15 seconds 09/29/22 met  4. Perform sit <> stand transfers from average chair height with S and VC to be able to use the swivel chair at his dining table.  Baseline: Min A  Goal status: 09/16/22-Inconsistently performs  with S from average height. At times requires CGA to cue forward weight shift. ongoing  LONG TERM GOALS: Target date: 09/29/22  Patient will be able to step over his tub at home using grab bars for stability with CGA to access his shower. Baseline: Unable- has bench, but wife reports he steps over then sits. Goal status: 09/16/22-met  2.  Decrease 5 x STS to < 18 sec to demonstrate improved functional strength Baseline:  Goal status: not met 62 seconds and able to do only 3 09/29/22  3.  Decrease TUG to < 25 sec to demonstrate improved functional balance Baseline:  Goal status: 09/29/22 15 seconds  4.  Patient will perform his bed mobility with MI. Baseline: Min A Goal status: on hard surface mat he is independent but on soft bed at home he struggles  5.  Patient will ambulate at least 400' with LRAD, CGA, demonstrate improved upright posture, longer step length. Baseline:  Goal status: 09/16/22-Amb >400', but began to fatigue after about 300' with more flexed posture, L foot shuffling more, unable to maintain safe technique. ongoing  6.  Patient will manage up and down a single step with LRAD, CGA to access his deck at home. Baseline: Unable Goal status: 09/16/22-Up and down 1 step with U rail. met  ASSESSMENT:  CLINICAL IMPRESSION: Patient had a very good day today with his walking light HHA and minimal cues for steps, he had less shuffling, on the TUG he really did well decreasing time from 48 seconds to 25 seconds wihtout device.  On our hard mat table he is independent with bed mobility but still needs assist from wife on softer bed at home.  He was more  attentive today and needed less cues for attention, more engaged today, this seems to come and go but when he can he does great OBJECTIVE IMPAIRMENTS: Abnormal gait, decreased activity tolerance, decreased balance, decreased cognition, decreased coordination, decreased endurance, decreased mobility, difficulty walking, decreased ROM, decreased strength, decreased safety awareness, impaired flexibility, impaired UE functional use, improper body mechanics, and postural dysfunction.   ACTIVITY LIMITATIONS: carrying, lifting, bending, sitting, standing, squatting, stairs, transfers, bed mobility, bathing, toileting, dressing, hygiene/grooming, and locomotion level  PARTICIPATION LIMITATIONS: meal prep, cleaning, laundry, interpersonal relationship, shopping, and community activity  PERSONAL FACTORS: Age, Past/current experiences, and 3+ comorbidities: CVA, Parkinson's traits, dementia  are also affecting patient's functional outcome.   REHAB POTENTIAL: Good  CLINICAL DECISION MAKING: Evolving/moderate complexity  EVALUATION COMPLEXITY: Moderate  PLAN:  PT FREQUENCY: 2x/week  PT DURATION: 12 weeks  PLANNED INTERVENTIONS: Therapeutic exercises, Therapeutic activity, Neuromuscular re-education, Balance training, Gait  training, Patient/Family education, Self Care, Joint mobilization, Stair training, Moist heat, and Manual therapy  PLAN FOR NEXT SESSION: sent renewal to MD Stacie Glaze, PT 09/29/22 3:43 PM

## 2022-09-30 ENCOUNTER — Ambulatory Visit: Payer: PPO | Admitting: Occupational Therapy

## 2022-09-30 DIAGNOSIS — I69354 Hemiplegia and hemiparesis following cerebral infarction affecting left non-dominant side: Secondary | ICD-10-CM

## 2022-09-30 DIAGNOSIS — R41840 Attention and concentration deficit: Secondary | ICD-10-CM

## 2022-09-30 DIAGNOSIS — M6281 Muscle weakness (generalized): Secondary | ICD-10-CM | POA: Diagnosis not present

## 2022-09-30 DIAGNOSIS — R278 Other lack of coordination: Secondary | ICD-10-CM

## 2022-09-30 DIAGNOSIS — I69318 Other symptoms and signs involving cognitive functions following cerebral infarction: Secondary | ICD-10-CM

## 2022-09-30 NOTE — Therapy (Signed)
OUTPATIENT OCCUPATIONAL THERAPY TREATMENT NOTE  Patient Name: William [name] MRN: [id number] DOB:[birth year], 81 y.o., male Today's Date: 09/30/2022  PCP & REFERRING PROVIDER:  [name], [name], DO    END OF SESSION:  OT End of Session - 09/30/22 1543     Visit Number 19    Number of Visits 32    Date for OT Re-Evaluation 11/16/22    Authorization Type [employer]    Authorization - Visit Number 19    Progress Note Due on Visit 20    OT Start Time 1403    OT Stop Time 1445    OT Time Calculation (min) 42 min    Activity Tolerance Patient tolerated treatment well    Behavior During Therapy WFL for tasks assessed/performed                        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   HTN (hypertension) 07/19/2015   Hyperlipidemia    Major neurocognitive disorder 01/09/2014   Mild, related to stroke history   Nocturia    Renal insufficiency 06/25/2013   S/P carotid endarterectomy    BILATERAL ICA--  PATENT PER DUPLEX  05-19-2012   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/26/2017   Right calf   Urinary frequency    Vitamin D deficiency    Past Surgical History:  Procedure Laterality Date   APPENDECTOMY  AS CHILD   CARDIOVASCULAR STRESS TEST  03-27-2012  DR CRENSHAW   LOW RISK LEXISCAN STUDY-- PROBABLE NORMAL PERFUSION AND SOFT TISSUE ATTENUATION/  NO ISCHEMIA/ EF 51%   CAROTID ENDARTERECTOMY Bilateral LEFT  11-12-2008  DR GREG HAYES   RIGHT ICA  2006  ([facility])   CHOLECYSTECTOMY N/A 02/23/2022   Procedure: LAPAROSCOPIC  CHOLECYSTECTOMY;  Surgeon: Quentin Ore, MD;  Location: [facility] OR;  Service: General;  Laterality: N/A;   CYSTOSCOPY W/ RETROGRADES Bilateral 06/22/2021   Procedure: CYSTOSCOPY WITH RETROGRADE PYELOGRAM;  Surgeon: Marcine Matar, MD;  Location: [facility];  Service: Urology;  Laterality: Bilateral;   CYSTOSCOPY WITH LITHOLAPAXY N/A 02/26/2013   Procedure: CYSTOSCOPY WITH LITHOLAPAXY;  Surgeon: Marcine Matar, MD;  Location: [facility];  Service: Urology;  Laterality: N/A;   ENDOSCOPIC RETROGRADE CHOLANGIOPANCREATOGRAPHY (ERCP) WITH PROPOFOL N/A 02/22/2022   Procedure: ENDOSCOPIC RETROGRADE CHOLANGIOPANCREATOGRAPHY (ERCP) WITH PROPOFOL;  Surgeon: Jeani Hawking, MD;  Location: [facility] ENDOSCOPY;  Service: Gastroenterology;  Laterality: N/A;   EYE SURGERY  Jan. 2016   cataract surgery both eyes   INGUINAL HERNIA REPAIR Right 11-08-2006   IR KYPHO EA ADDL LEVEL THORACIC OR LUMBAR  02/12/2021   IR RADIOLOGIST EVAL & MGMT  02/18/2021   MASS EXCISION N/A 03/03/2016   Procedure: EXCISION OF BACK  MASS;  Surgeon: Almond Lint, MD;  Location: [facility];  Service: General;  Laterality: N/A;   MOHS SURGERY Left 1/ 2016   Dr Margo Aye-- Basal cell   PROSTATE SURGERY     REMOVAL OF STONES  02/22/2022   Procedure: REMOVAL OF STONES;  Surgeon: Jeani Hawking, MD;  Location: [facility] ENDOSCOPY;  Service: Gastroenterology;;   Dennison Mascot  02/22/2022   Procedure: Dennison Mascot;  Surgeon: Jeani Hawking, MD;  Location: [facility] ENDOSCOPY;  Service: Gastroenterology;;   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-C N/A 06/22/2021   Procedure: TRANSURETHRAL RESECTION OF BLADDER TUMOR;  Surgeon: Marcine Matar, MD;  Location: [facility];  Service: Urology;  Laterality: N/A;   TRANSURETHRAL RESECTION OF PROSTATE N/A 02/26/2013   Procedure: TRANSURETHRAL RESECTION OF THE PROSTATE WITH GYRUS INSTRUMENTS;  Surgeon: Marcine Matar, MD;  Location: [facility];  Service: Urology;  Laterality: N/A;   TRANSURETHRAL RESECTION OF PROSTATE N/A 06/22/2021   Procedure: TRANSURETHRAL RESECTION OF THE PROSTATE (TURP);  Surgeon: Marcine Matar, MD;  Location: [facility];  Service: Urology;  Laterality: N/A;   Patient Active Problem List   Diagnosis Date Noted   Dysuria 08/03/2022   Nonintractable headache 07/01/2022   Bilateral impacted cerumen 06/24/2022   Rash 06/15/2022   Postoperative ileus 02/27/2022   Ileus, postoperative 02/26/2022   Choledocholithiasis 02/19/2022   DNR (do not resuscitate) 02/19/2022   Left elbow pain 01/26/2022   Mid back pain on left side 01/26/2022   Rib pain 01/26/2022   Acute pain of left shoulder 11/12/2021   Leukocytes in urine 11/12/2021   Urinary frequency 11/12/2021   Thrush 10/08/2021   Hemiplegia, dominant side S/P CVA (cerebrovascular accident) 09/11/2021   Insomnia    Prediabetes    Acute 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    Basal ganglia infarction 07/29/2021   Transaminitis 07/27/2021   UTI (urinary tract infection) 07/27/2021   CVA (cerebral vascular accident) 07/27/2021   Fall 07/27/2021   Hyperglycemia 07/27/2021   Cholelithiasis 07/27/2021   Hypoxia 07/27/2021   Nausea and vomiting 07/27/2021   Acute metabolic encephalopathy 07/27/2021   Normocytic anemia 07/27/2021   Chronic back pain 07/27/2021   Malignant neoplasm of overlapping sites of bladder 06/22/2021   Closed fracture of first lumbar vertebra with routine healing 02/03/2021   Closed fracture of multiple ribs 11/18/2020   Anxiety 01/29/2020   Leg pain, bilateral 01/29/2020   Ingrown toenail 07/13/2019   Lumbar spondylosis 05/02/2018   Pain in left knee 03/09/2018   Osteoarthritis of left hip 01/16/2018   Trochanteric bursitis of left hip 01/16/2018   Preventative health care 09/26/2017   HTN (hypertension) 07/19/2015   Hyperlipidemia 07/19/2015   Great toe pain  02/11/2014   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   Obesity (BMI 30-39.9) 06/25/2013   Renal insufficiency 06/25/2013   Weakness of left arm 06/25/2013   Sebaceous cyst 03/03/2011   Sprain of lumbar region 07/31/2010   Rib pain, left 08/29/2009   Carotid artery stenosis, asymptomatic, bilateral 05/02/2009   Eczema, atopic 05/31/2008   Vitamin D deficiency 03/01/2008   BPH (benign prostatic hyperplasia) 08/06/2007   Fasting hyperglycemia 12/21/2006   History of right MCA infarct 2006    ONSET DATE: Acute exacerbation of chronic symptoms (most recent hospitalization 02/26/22 - 03/12/22, followed by SNF and home health)   REFERRING DIAG:  I63.9 (ICD-10-CM) - Cerebrovascular accident (CVA), unspecified mechanism (HCC)  W19.XXXD (ICD-10-CM) - Fall, subsequent encounter    THERAPY DIAG:  Muscle weakness (generalized)  Hemiplegia and hemiparesis following cerebral infarction affecting left non-dominant side  Other lack of coordination  Attention and concentration deficit  Other symptoms and signs involving cognitive functions following cerebral infarction  Rationale for Evaluation and  Treatment: Rehabilitation  PERTINENT HISTORY: PMHx includes L basal ganglia and corona radiata infarct 07/27/21, L lower homonymous quadrantanopia; R MCA infarct in 2006 w/ L-sided hemiparesis, NCD, carotid artery disease (bilateral), HTN, HLD,CKD3, vascular dementia, and hx of bladder cancer. He has had falls in 2023, fracturing his clavicle in August, then he had stomach surgery in Sept 2023. He has He needs assist at baseline for ADLs. When last documented in acute rehab after having his gall-bladder removed, (03/09/22) he required moderate to maximum assist x2 for most ADLs (more assist for ADLs the require standing), and they recommended rehab in skilled nursing setting. He apparently got Covid at some point and also attended a SNF. Once he D/C home, he had some episodes of combativeness at  night  with "sun-downing."  He is communicative and appropriate, though appears easily confused and distracted, not fully oriented to time and place (Ax2, states date is 20 and cannot say what city he lives in). His wife provides most details, and he is participative, though a poor historian at times. In a nut shell- he has been suffering from dementia and post-stroke effects for years, and his issues were recently exacerbated in Sept of last year after abdominal surgery. He has less use of Lt "stroke side" and new, ritualistic "ticks" in mouth and Rt hand/arm.  Wife states that they do have a caregiver come M, W, F for 4 hours in the middle of the day to mainly help him bathe and dress, which usually tires him out. He has been more combative towards the end of the day as well.   PRECAUTIONS: Fall and Other: Lt hemi, poor cognition, LATEX and adhesive allergies, etc.  ;WEIGHT BEARING RESTRICTIONS: No   SUBJECTIVE:   SUBJECTIVE STATEMENT: I'm good  Pt accompanied by:  his wife  PAIN: no  FALLS: Has patient fallen in last 6 months? Yes. Number of falls at least 1 resulting in clavicle fx (now healed)   LIVING ENVIRONMENT: Lives with: lives with their spouse Lives in: House/apartment Has following equipment at home: manual w/c, 2 w/w, weighted utensils, shower bench (for tub), bed cane, hospital bed, raised toilet, and possibly more  PLOF: Needs assistance with ADLs, Needs assistance with homemaking, Needs assistance with gait, and Needs assistance with transfers  PATIENT GOALS: at eval- increase personal hygiene skills (brush teeth, shaving)  prepare instant coffee or microwave meal improve functional cognitive skills to play games, etc.,   increase Lt arm usage try to help decrease Rt hand/arm perseveration (flicking, tapping, noise making) Improve fnl tranfers in/out of multiple seats (tub bench, swivel chair, etc.)    OBJECTIVE: (All objective assessments below are from initial  evaluation on: 07/07/22 unless otherwise specified.)   HAND DOMINANCE: Right  ADLs: Overall ADLs: Wife states decreased ability since hospitalization in Sept 23.  Transfers/ambulation related to ADLs: Min A transfer, CGA for mobility Eating: set up Grooming: Mod A- needs assist with oral care, shaving, but can comb his hair UB Dressing: Min A  LB Dressing: Dep  Toileting:  Mod  Bathing: Mod  Tub Shower transfers: Min A  Equipment: Emergency planning/management officer, Grab bars, and Feeding equipment  IADLs: completely dependent for years now, except some ability to use microwave which is now not possible after hospitalization   MOBILITY STATUS: Needs Assist: CGA - Min A, Hx of falls, Festination, difficulty with turns, and neglect on Lt side  POSTURE COMMENTS:  rounded shoulders, forward head, and weight shift left  ACTIVITY TOLERANCE: Activity  tolerance: he became tired and somewhat agitated by the end of the OT evaluation today (after PT and OT about 2.5hours with mostly sedate activities in seated positions)  FUNCTIONAL OUTCOME MEASURES: [facility] 17/30  showing problems with visuospatial/executive function, attention, fluency, and orientation. (at least 26/30 is needed to be considered "normal")  Trinna Balloon in ADLs: 1/6  (very dependent)  UPPER EXTREMITY ROM:    Active ROM Right eval Left eval  Shoulder scaption 100 85  Shoulder abduction    Shoulder adduction    Shoulder extension    Shoulder internal rotation Reaches small of back Reaches to hip  Shoulder external rotation Reaches back of head  Reaches up to ear  Elbow flexion 150 130  Elbow extension (-10*)  (-35*)  Wrist flexion 35 20  Wrist extension 50 10  (Blank rows = not tested)  UPPER EXTREMITY MMT:    Tested grossly as "push" and "pull": UE push 4-/5, UE pull 4/5  HAND FUNCTION: Grip strength: Right: 30 lbs; Left: 20 lbs  COORDINATION: Finger Nose Finger test: mildly ataxic but fairly accurate  Box and Blocks:   Right 11blocks, Left 5blocks  MUSCLE TONE:  Rt- normal, Lt- decreased and worsened by inattention/disuse  COGNITION: Overall cognitive status: Impaired and Difficulty to assess due to: severity of deficits; Poor attention, fluency, orientation, and executive function. Recall is fairly good as well as abstraction.  Seems to have personality changes due to vascular dementia- like an OCD type volitional perseveration of tapping with Rt hand that he can somewhat control.  He did become angry and agitated at the end of the session performing car transfer with spouse: he did shout at her.  VISION: Subjective report: no new changes per pt/spouse, uses "cheaters"   VISION ASSESSMENT: Visual fields seem intact though he had latent responses on the left side showing likely inattention/and/neglect, tracking somewhat delayed horizontally and vertically  PERCEPTION: Impaired: Inattention/neglect: does not attend to left visual field and does not attend to left side of body, Body scheme: poor quality of recognition with shaving, etc., and Spatial orientation: decreased in print and in orientation, especially to Lt side  PRAXIS: Not tested specifically but no overt/stated issues using pen, wearing glasses ,etc.   OBSERVATIONS: Pt taps and flicks and does seem to volitionally perseverate on making a rhythm/noise with Rt hand. He states he is aware of it and chooses to do it.  He does also seem to have mild resting and intention tremor.    TODAY'S TREATMENT:     DATE: 09/30/22- seated on mat functional reaching with right and left UE's to grasp and release cones to target with trunk rotation followed by standing to place cones in overhead cabinet with left and right UE's min v.c and facilitaiton for LUE use. Seated playing connect 4 using left and right UE's to place checkers in frame, mod v.c for LUE use, pt won the game.  Placing large pegs into pegboard in seated with RUE with cognitve component, then  removing with LUE, min-mod v.c for LUE use. Arm bike x 4 mins level 1 for conditioning.   09/28/22 Pt's wife brought in pt's electric toothbrush, tooothpaste and cup.Pt stood at the sink with supervision to apply toothpaste to brush, min facilitation/ v.c for using LUE to stabilize toothbrush, pt required min A , and min v.c intially to open toothpaste. Therapist marked toothpaste for verbal cue and pt opened a second time with increased success. Pt applied toothpaste to brush and turned brush  on to begin brushing teeth. Pt performed the task with mod v.c and min facilitation/ hand over hand assist to slow down brushing speed. Pt was also cued to brush left side and cued to pause when he was noted to be brushing too hard. Pt sat on the mat using a small portable mirror to shave with electric razor. Pt required mod A for task for thoroughness. Pt was cued to shave specific areas, and therapist facilitated pt's hand positioning so that razor was flatter on face. Therapist assisted pt with shaving mustache area and pt was noted to have increased difficulty this this portion. Seated at table card matching task to match by number or suit, min v.c and mod facilitation for LUE use.  09/23/22- doffing jacket seated on mat, mod A, v.c Standing at countertop Pt simulated opening OJ container and closing with supervision.  Dynamic functional step and reach to targets on left and right sides, supervision and min v.c Functional reaching inially in standing to remove wooden pegs from pegboard with LUE, mod v.c, due to fatigue pt transitioned to seated.   Replacing pegs into pegboard with trunk rotation and min-mod v.c, min facilitation for LUE use. Closed chain shoulder flexion with ball, mod failitation/ v.c.  Donning jacket with min A and mod v.c  09/21/22- doffing jacket seated on mat mod difficulty and v.c for getting arms out of sleeves Standing at counter to open various containers using bilateral UE's min-mod  v.c for LUE use. Therapist marked Folgers container for visual cue for patient, he was able to successfully open without v.c using the visual cue. Functional reaching into cabinets to place and retrieve cones with left and right UE's, mod v.c for LUE use. Functional reaching with graded clothespins to place on eye level targets, yellow and red with LUE, remainder with RUE for sustained pinch and functional reach, min-mod v.c and demonstration. Seated at table placing large pegs into pegboard, mod/ max difficulty with LUE requiring facilitation, removing pegs with LUE to place in continer. Donning jacket with min A and min v.c in standing. Pt was assisted to car with min A and v.c     PATIENT EDUCATION: Education details: See treatment section above for details,  Person educated: Patient and Spouse Education method: Explanation, Demonstration, Tactile cues, and Verbal cues Education comprehension: verbalized understanding, returned demonstration, verbal cues required, tactile cues required, and needs further education  HOME EXERCISE PROGRAM: Access Code: [email or web address] URL: [email or web address] Date: 07/21/2022 Prepared by: Fannie Knee Exercises - Seated Shoulder Flexion AAROM with Dowel  - 4-6 x daily - 1 sets - 10-15 reps - Seated Shoulder Abduction AAROM with Dowel  - 4-6 x daily - 1 sets - 10-15 reps - Seated Shoulder External Rotation AAROM with Dowel  - 4-6 x daily - 1 sets - 10-15 reps - Seated Bicep Curls with Bar  - 4-6 x daily - 1 sets - 10-15 reps - Seated Shoulder Row with Anchored Resistance  - 4-6 x daily - 1 sets - 10-15 reps     GOALS: Goals reviewed with patient? Yes   SHORT TERM GOALS: (STG required if POC>30 days) Target Date: 09/23/22   Pt will demo/state understanding of initial HEP (or home activity suggestions to maintain function as appropriate) to improve functional skills. Goal status: 07/28/22- Met (addressed in past 2 sessions) 2. Pt will  donn a t-shirt consistently with min A and v.c   Goal status: met , per wife's report 3. Pt will demonstrate ability to  open the newspaper  with supervision min v.c Goal status:  ongoing, inconsistent, min A at times 4.  Pt will consistently sustain focus to a simple functional task  in a minimally distracting environment x 6 mins with no more than 1 re-direct(ie: eating, functional task in clinic)  Goal status:  inconsistent, however pt has been able to sutain attention to newspaper task x 6 mins several times recently 09/15/22  5. Pt will consistently open containers with only supervision and min v.c           Goal status: ongoing 09/21/22, improved for coffee container with visual cue, sometimes inconsistent    LONG TERM GOALS: Target Date: 11/16/22  Pt will improve functional ability by decreased impairment per Trinna Balloon assessment from 1/6 to 2/6 or better, for better quality of life. Goal status:  not met 1/6  2.  Caregiver will state his grooming ability/hygiene ability improved from moderate assist to supervision assist. Goal status: not met-Pt continues to require mod A.  3.  He will show better functional use of left arm by improved box and blocks Test score from 5 blocks to at least 10 blocks. Goal status:not met but improved 8 blocks  4.  Pt will show ability to microwave instant coffee or small meal safely, to help decrease caregiver burden. Goal status: partially met, pt is able to microwave coffee with supervision, however occasional min A to open containers 5.  He will show improved cognitive ability enough to play a simple game with OT and caregiver. Goal status: approximating goal, pt has played a game of uno at home with family.  6.  Caregiver will state at least 4 ways to decrease perseveration with right arm, also stating that this behaviors at least somewhat better in the home now. Goal status: deferred perseverative behaviors have improved.  7. Pt will donn  a flannel button front shirt consistently with min A and min v.c Baseline: mod-max Goal status:  ongoing, min-mod A, min-mod v.c 09/15/22 8.  Pt will use his LUE as an active assist for ADLS grossly 40% of the time with min v.c Baseline: uses 25% Goal status:  ongoing 09/15/22  9 Pt will stand at the countertop and perform functional activities  incorporating bilateral UE's with supervision and min v.c  Goal status:  ongoing, min assist at time-09/15/22 10. Pt will back up to a chair and sit down squarely at least 1/3 of the time with min v.c  Goal status: ongoing , performs in clinic with min-mod v.c 09/15/22 11..  Pt will open the newspaper and locate sports section with no more than min v.c                          Goal status:  ongoing, min-mod A at times 09/15/22  ASSESSMENT:  CLINICAL IMPRESSION: Pt is progressing towards goals. Pt continues to progress with improving attention to functional tasks. Pt continues to require cueing for LUE use. OT Frequency/DURATION: 2x week x 12 weeks however anticipate d/c after 8 weeks dependent on progress  PLANNED INTERVENTIONS: self care/ADL training, therapeutic exercise, therapeutic activity, neuromuscular re-education, manual therapy, balance training, functional mobility training, moist heat, cryotherapy, patient/family education, cognitive remediation/compensation, visual/perceptual remediation/compensation, psychosocial skills training, energy conservation, coping strategies training, DME and/or AE instructions, and Re-evaluation  RECOMMENDED OTHER SERVICES: He has already seen PT for balance and functional mobility, he would probably benefit from speech for cognitive issues as well, though OT  will be working with cognition somewhat functionally now.  CONSULTED AND AGREED WITH PLAN OF CARE: Patient and family member/caregiver  PLAN FOR NEXT SESSION:    Work towards goals, continue to address ADL tasks prn,  Functional reaching, functional use of  bilateral UE's in standing,    Keene Breath, OTR/L 3:44 PM 09/30/22

## 2022-10-04 ENCOUNTER — Telehealth: Payer: Self-pay | Admitting: Family Medicine

## 2022-10-04 ENCOUNTER — Other Ambulatory Visit: Payer: Self-pay

## 2022-10-04 ENCOUNTER — Ambulatory Visit: Payer: PPO

## 2022-10-04 DIAGNOSIS — M6281 Muscle weakness (generalized): Secondary | ICD-10-CM

## 2022-10-04 DIAGNOSIS — R278 Other lack of coordination: Secondary | ICD-10-CM

## 2022-10-04 DIAGNOSIS — I69318 Other symptoms and signs involving cognitive functions following cerebral infarction: Secondary | ICD-10-CM

## 2022-10-04 DIAGNOSIS — R41840 Attention and concentration deficit: Secondary | ICD-10-CM

## 2022-10-04 DIAGNOSIS — I69354 Hemiplegia and hemiparesis following cerebral infarction affecting left non-dominant side: Secondary | ICD-10-CM

## 2022-10-04 NOTE — Telephone Encounter (Signed)
Pt's wife came in to drop off papers to be filled out. She would like a call when they are ready. Paperwork placed in pcp's folder up front.

## 2022-10-04 NOTE — Therapy (Signed)
OUTPATIENT PHYSICAL THERAPY NEURO Treatment   Patient Name: William [name] MRN: [id number] DOB:08/09/1940, 82 y.o., male Today's Date: 10/04/2022  PCP: Donato Schultz  DO REFERRING PROVIDER: same  END OF SESSION:       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   HTN (hypertension) 07/19/2015   Hyperlipidemia    Major neurocognitive disorder 01/09/2014   Mild, related to stroke history   Nocturia    Renal insufficiency 06/25/2013   S/P carotid endarterectomy    BILATERAL ICA--  PATENT PER DUPLEX  05-19-2012   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/26/2017   Right calf   Urinary frequency    Vitamin D deficiency    Past Surgical History:  Procedure Laterality Date   APPENDECTOMY  AS CHILD   CARDIOVASCULAR STRESS TEST  03-27-2012  DR CRENSHAW   LOW RISK LEXISCAN STUDY-- PROBABLE NORMAL PERFUSION AND SOFT TISSUE ATTENUATION/  NO ISCHEMIA/ EF 51%   CAROTID ENDARTERECTOMY Bilateral LEFT  11-12-2008  DR GREG HAYES   RIGHT ICA  2006  ([facility])   CHOLECYSTECTOMY N/A 02/23/2022   Procedure: LAPAROSCOPIC CHOLECYSTECTOMY;  Surgeon: Quentin Ore, MD;  Location: [facility];  Service: General;  Laterality: N/A;   CYSTOSCOPY W/ RETROGRADES Bilateral 06/22/2021   Procedure: CYSTOSCOPY WITH RETROGRADE PYELOGRAM;  Surgeon: Marcine Matar, MD;  Location: [facility];  Service: Urology;  Laterality: Bilateral;   CYSTOSCOPY WITH LITHOLAPAXY N/A 02/26/2013   Procedure: CYSTOSCOPY WITH LITHOLAPAXY;  Surgeon: Marcine Matar, MD;  Location: [facility];  Service: Urology;  Laterality: N/A;   ENDOSCOPIC RETROGRADE CHOLANGIOPANCREATOGRAPHY (ERCP) WITH PROPOFOL N/A 02/22/2022   Procedure: ENDOSCOPIC RETROGRADE CHOLANGIOPANCREATOGRAPHY (ERCP) WITH PROPOFOL;  Surgeon: Jeani Hawking, MD;  Location: [facility] ENDOSCOPY;  Service: Gastroenterology;  Laterality: N/A;   EYE SURGERY  Jan. 2016   cataract surgery both eyes   INGUINAL HERNIA REPAIR Right 11-08-2006   IR KYPHO EA ADDL LEVEL THORACIC OR LUMBAR  02/12/2021   IR RADIOLOGIST EVAL & MGMT  02/18/2021   MASS EXCISION N/A 03/03/2016   Procedure: EXCISION OF BACK  MASS;  Surgeon: Almond Lint, MD;  Location: [facility];  Service: General;  Laterality: N/A;   MOHS SURGERY Left 1/ 2016   Dr Margo Aye-- Basal cell   PROSTATE SURGERY     REMOVAL OF STONES  02/22/2022   Procedure: REMOVAL OF STONES;  Surgeon: Jeani Hawking, MD;  Location: [facility] ENDOSCOPY;  Service: Gastroenterology;;   Dennison Mascot  02/22/2022   Procedure: Dennison Mascot;  Surgeon: Jeani Hawking, MD;  Location: [facility] ENDOSCOPY;  Service: Gastroenterology;;   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-C N/A 06/22/2021   Procedure: TRANSURETHRAL RESECTION OF BLADDER TUMOR;  Surgeon: Marcine Matar, MD;  Location: [facility];  Service: Urology;  Laterality: N/A;   TRANSURETHRAL RESECTION OF PROSTATE N/A 02/26/2013   Procedure: TRANSURETHRAL RESECTION OF THE PROSTATE WITH GYRUS INSTRUMENTS;  Surgeon: Marcine Matar, MD;  Location: [facility];  Service: Urology;  Laterality: N/A;   TRANSURETHRAL  RESECTION OF PROSTATE N/A 06/22/2021   Procedure: TRANSURETHRAL RESECTION OF THE PROSTATE (TURP);  Surgeon: Marcine Matar, MD;  Location: [facility];  Service: Urology;  Laterality: N/A;   Patient Active Problem List   Diagnosis Date Noted   Dysuria 08/03/2022   Nonintractable headache 07/01/2022   Bilateral impacted cerumen 06/24/2022   Rash 06/15/2022   Postoperative  ileus 02/27/2022   Ileus, postoperative 02/26/2022   Choledocholithiasis 02/19/2022   DNR (do not resuscitate) 02/19/2022   Left elbow pain 01/26/2022   Mid back pain on left side 01/26/2022   Rib pain 01/26/2022   Acute pain of left shoulder 11/12/2021   Leukocytes in urine 11/12/2021   Urinary frequency 11/12/2021   Thrush 10/08/2021   Hemiplegia, dominant side S/P CVA (cerebrovascular accident) 09/11/2021   Insomnia    Prediabetes    Acute 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    Basal ganglia infarction 07/29/2021   Transaminitis 07/27/2021   UTI (urinary tract infection) 07/27/2021   CVA (cerebral vascular accident) 07/27/2021   Fall 07/27/2021   Hyperglycemia 07/27/2021   Cholelithiasis 07/27/2021   Hypoxia 07/27/2021   Nausea and vomiting 07/27/2021   Acute metabolic encephalopathy 07/27/2021   Normocytic anemia 07/27/2021   Chronic back pain 07/27/2021   Malignant neoplasm of overlapping sites of bladder 06/22/2021   Closed fracture of first lumbar vertebra with routine healing 02/03/2021   Closed fracture of multiple ribs 11/18/2020   Anxiety 01/29/2020   Leg pain, bilateral 01/29/2020   Ingrown toenail 07/13/2019   Lumbar spondylosis 05/02/2018   Pain in left knee 03/09/2018   Osteoarthritis of left hip 01/16/2018   Trochanteric bursitis of left hip 01/16/2018   Preventative health care 09/26/2017   HTN (hypertension) 07/19/2015   Hyperlipidemia 07/19/2015   Great toe pain 02/11/2014   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   Obesity (BMI 30-39.9) 06/25/2013   Renal insufficiency 06/25/2013   Weakness of left arm 06/25/2013   Sebaceous cyst 03/03/2011   Sprain of lumbar region 07/31/2010   Rib pain, left 08/29/2009   Carotid artery stenosis, asymptomatic, bilateral 05/02/2009   Eczema, atopic 05/31/2008   Vitamin D deficiency 03/01/2008   BPH (benign prostatic hyperplasia) 08/06/2007   Fasting hyperglycemia 12/21/2006    History of right MCA infarct 2006    ONSET DATE: 06/29/22  REFERRING DIAG:  Diagnosis  I63.9 (ICD-10-CM) - Cerebrovascular accident (CVA), unspecified mechanism (HCC)  W19.XXXD (ICD-10-CM) - Fall, subsequent encounter    THERAPY DIAG:  No diagnosis found.  Rationale for Evaluation and Treatment: Rehabilitation  SUBJECTIVE:  SUBJECTIVE STATEMENT: no falls , has ups and downs, wife reports that he is having a "very good Day"  PERTINENT HISTORY:  VERNIS EID is a 82 year old man with dementia, CKD, HTN, CAD, HLD and history of CVA  (2006, 2021), Parkinson's  PAIN:  Are you having pain? No  PRECAUTIONS: Fall  WEIGHT BEARING RESTRICTIONS: No  FALLS: Has patient fallen in last 6 months? No  LIVING ENVIRONMENT: Lives with: lives with their family and lives with their spouse Lives in: House/apartment Stairs:  1 brick high step Has following equipment at home: Environmental consultant - 2 wheeled, Wheelchair (manual), Shower bench, bed side commode, Grab bars, and hospital bed, sliding pad for car  PLOF: Independent with basic ADLs and Independent with household mobility without device  PATIENT GOALS: Walk around the block with his wife. Be able to use the dining room chair rather than have to use the W/C. Increased I with bed mobility and simple tasks at home like make some coffee or brush his teeth, Step over tub to shower, has bench, but he can't really use it. Up and down the one step to get to back deck.  OBJECTIVE:   DIAGNOSTIC FINDINGS:  MRI 2021 with degenerative changes in lumbar spine, L3-4 R subarticular stenosis  COGNITION: Overall cognitive status: History of cognitive impairments - at baseline   SENSATION: Not tested  COORDINATION: Moderately impaired BLE   MUSCLE LENGTH: Hamstrings:  severely restricted B Thomas test: Severely restricted B.  POSTURE: rounded shoulders, forward head, increased thoracic kyphosis, posterior pelvic tilt, and flexed trunk   LOWER EXTREMITY ROM:   BLE extremely stiff throughout, limited hip ROM in all planes, B knees limited in extension.   LOWER EXTREMITY MMT:  3+/5 throughout. Unable to determine accuracy of MMT due to cognition. Functionally demosntrates poor coordinated activation and poor muscular endurance.   BED MOBILITY: Min A for Supine<> Sit. Patient was using a ladder in his hospital bed and could move MI.  TRANSFERS: Min A, mod VC, tends to lean back.  CURB: Min A-Has one step out to his deck.  STAIRS: N/A   GAIT: Gait pattern: step to pattern, decreased arm swing- Right, decreased arm swing- Left, decreased step length- Right, decreased step length- Left, shuffling, festinating, trunk flexed, narrow BOS, poor foot clearance- Right, and poor foot clearance- Left Distance walked: 46' Assistive device utilized: Environmental consultant - 2 wheeled Level of assistance: Occasional min A Comments: mod VC to stay close to walker, take longer steps.  FUNCTIONAL TESTS:  5 times sit to stand: 38.23  09/29/22  could not performs 5XSTS Timed up and go (TUG): 47.91  with RW, 07/21/22 TUG 29  seconds without device, 09/29/22 25 seconds  TODAY'S TREATMENT:                                                                                                                               DATE:  09/29/22 Gait outside total  600 feet with 2 rests some walking in grass, uneven terrain See TUG, 5X STS above Worked on bed mobility and on our harder surfaces he is independent with in and out of bed and can position himself Volleyball  Side stepping, backward walking  09/27/22 Nustep level 5 x 5 minutes Gait outside halfway around the parking [place] then rest, then back to this building and a rest outsie and then in the grass around to the front door with CGA/HHA on  the right and some cues for step length Standing in Pbars marching and hip abduction 2x10 each Fwd/bkwd walking in the Pbars Seated volleyball Standing kicks Direction changes Seated 6" clears to practice in and out of car Gait back out to the car  09/22/2022 Nustep level 5 x 5 minutes Bike level 5 x 6 minutes Gait outside 1/2 parking [place] then sit and rest and then back around, right HHA and use of gait belt Standing straight arm pulls 5# one at a time Sit to stand with weighted ball and a higher level seating surface Standing left leg 4" toe clears Standing with right foot on 4" step for weight bearing and use of the left leg Standing and turning picking up cones and placing them other areas Folding laundry from one surface and turning and putting on another surface, needs some cues to use the left arm   09/20/22: NuStep L4 x 6 minutes Stair taps with opposite legs, BUE support, x 5 each, CGA Long steps across air ex planks placed horizontally, UUE support on rail of parallel bars, cues to face forward, support weight with R hand on bar.  Repeat with side stepping each direction Practiced sit to stand in parallel bars, emphasize forward weight shift, able to rise with CGA Moved to mat to continue forward weight shifts and sit to stand, CGA Ambulated x 70', with CGA, x 3, in between sessions at ll bars.festinating gait . Seated forward trunk leans, with large therapeutic ball  2 sets 10 reps Seated forward rolls with ball , and press ups, partial standing , 2 sets 5 reps.  09/16/22 NuStep L4 x 6 minutes Functional re-assessment for PN-see goals Stair taps with opposite legs, BUE support, x 5 each, CGA Long steps across air ex planks placed horizontally, UUE support on rail of parallel bars. Repeat with side stepping each direction Standing weight shifts on air ex pad, no UE support, CGA, reluctant to weight shift forward and back. Practiced sit to stand in parallel bars,  emphasize forward weight shift, able to rise with CGA Moved to mat to continue forward weight shifts and sit to stand, CGA Ambulated x 50', improved upright posture and foot clearance.  09/14/22 NuStep L5 x18mins UBE L2 forwards and back  Volleyball hits sitting 2x10 each hand  Leg ext 5# 2x10 HS curls10# 2x10 Ball squeezes 2x10 Hip abduction with green band 2x10  09/07/22 Nustep level 5 x 6 minutes  Tmill  MPH CGA x 1 minute Gait outside around the parking [place] with one rest break Stairs step over step In pbars marching and hip abduction Walking forward and backward in pbars Gait outside in the grass  09/02/22 Nustep level 5 x 5 minutes Walking HHA outside halfway around the parking [place] and then rest on table, then back in 2.5# on the left ankle having him sit and raise foot up and abduct to a 6" step on the left Bed mobility rolling side to side [place], partial sit ups Walk  300 feet with HHA a lot of cues for the step length Ball kicks Volley ball standing and sitting  PATIENT EDUCATION: Education details: POC Person educated: Patient and Spouse Education method: Explanation Education comprehension: verbalized understanding  HOME EXERCISE PROGRAM: TBD  GOALS: Goals reviewed with patient? Yes  SHORT TERM GOALS: Target date: 07/19/22  I with initial HEP Baseline: Goal status: ongoing  2. Decrease 5 x STS to < 25 sec to demonstrate improved functional strength  Baseline: 38 sec  Goal status: 08/09/22 14 sec-met  3. Decrease TUG to < 35 sec to demonstrate improved functional balance  Baseline: 47 sec  Goal status: 15 seconds 09/29/22 met  4. Perform sit <> stand transfers from average chair height with S and VC to be able to use the swivel chair at his dining table.  Baseline: Min A  Goal status: 09/16/22-Inconsistently performs  with S from average height. At times requires CGA to cue forward weight shift. ongoing  LONG TERM GOALS: Target date:  09/29/22  Patient will be able to step over his tub at home using grab bars for stability with CGA to access his shower. Baseline: Unable- has bench, but wife reports he steps over then sits. Goal status: 09/16/22-met  2.  Decrease 5 x STS to < 18 sec to demonstrate improved functional strength Baseline:  Goal status: not met 62 seconds and able to do only 3 09/29/22  3.  Decrease TUG to < 25 sec to demonstrate improved functional balance Baseline:  Goal status: 09/29/22 15 seconds  4.  Patient will perform his bed mobility with MI. Baseline: Min A Goal status: on hard surface mat he is independent but on soft bed at home he struggles  5.  Patient will ambulate at least 400' with LRAD, CGA, demonstrate improved upright posture, longer step length. Baseline:  Goal status: 09/16/22-Amb >400', but began to fatigue after about 300' with more flexed posture, L foot shuffling more, unable to maintain safe technique. ongoing  6.  Patient will manage up and down a single step with LRAD, CGA to access his deck at home. Baseline: Unable Goal status: 09/16/22-Up and down 1 step with U rail. met  ASSESSMENT:  CLINICAL IMPRESSION: Patient had a very good day today with his walking light HHA and minimal cues for steps, he had less shuffling, on the TUG he really did well decreasing time from 48 seconds to 25 seconds wihtout device.  On our hard mat table he is independent with bed mobility but still needs assist from wife on softer bed at home.  He was more attentive today and needed less cues for attention, more engaged today, this seems to come and go but when he can he does great OBJECTIVE IMPAIRMENTS: Abnormal gait, decreased activity tolerance, decreased balance, decreased cognition, decreased coordination, decreased endurance, decreased mobility, difficulty walking, decreased ROM, decreased strength, decreased safety awareness, impaired flexibility, impaired UE functional use, improper body mechanics,  and postural dysfunction.   ACTIVITY LIMITATIONS: carrying, lifting, bending, sitting, standing, squatting, stairs, transfers, bed mobility, bathing, toileting, dressing, hygiene/grooming, and locomotion level  PARTICIPATION LIMITATIONS: meal prep, cleaning, laundry, interpersonal relationship, shopping, and community activity  PERSONAL FACTORS: Age, Past/current experiences, and 3+ comorbidities: CVA, Parkinson's traits, dementia  are also affecting patient's functional outcome.   REHAB POTENTIAL: Good  CLINICAL DECISION MAKING: Evolving/moderate complexity  EVALUATION COMPLEXITY: Moderate  PLAN:  PT FREQUENCY: 2x/week  PT DURATION: 12 weeks  PLANNED INTERVENTIONS: Therapeutic exercises, Therapeutic activity, Neuromuscular re-education, Balance training, Gait  training, Patient/Family education, Self Care, Joint mobilization, Stair training, Moist heat, and Manual therapy  PLAN FOR NEXT SESSION: sent renewal to MD Stacie Glaze, PT 10/04/22 4:07 PM

## 2022-10-04 NOTE — Therapy (Signed)
OUTPATIENT PHYSICAL THERAPY NEURO Treatment   Patient Name: William [name] MRN: [id number] DOB:1940/12/10, 82 y.o., male Today's Date: 10/04/2022  PCP: Donato Schultz  DO REFERRING PROVIDER: same  END OF SESSION:  PT End of Session - 10/04/22 1607     Visit Number 25    Date for PT Re-Evaluation 10/30/22    PT Start Time 1517    PT Stop Time 1600    PT Time Calculation (min) 43 min    Equipment Utilized During Treatment Gait belt    Activity Tolerance Patient tolerated treatment well    Behavior During Therapy WFL for tasks assessed/performed                 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   HTN (hypertension) 07/19/2015   Hyperlipidemia    Major neurocognitive disorder 01/09/2014   Mild, related to stroke history   Nocturia    Renal insufficiency 06/25/2013   S/P carotid endarterectomy    BILATERAL ICA--  PATENT PER DUPLEX  05-19-2012   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/26/2017   Right calf   Urinary frequency    Vitamin D deficiency    Past Surgical History:  Procedure Laterality Date   APPENDECTOMY  AS CHILD   CARDIOVASCULAR STRESS TEST  03-27-2012  DR CRENSHAW   LOW RISK LEXISCAN STUDY-- PROBABLE NORMAL PERFUSION AND SOFT TISSUE ATTENUATION/  NO ISCHEMIA/ EF 51%   CAROTID ENDARTERECTOMY Bilateral LEFT  11-12-2008  DR GREG HAYES   RIGHT ICA  2006  ([facility])   CHOLECYSTECTOMY N/A 02/23/2022   Procedure: LAPAROSCOPIC CHOLECYSTECTOMY;  Surgeon: Quentin Ore, MD;  Location: [facility] OR;  Service: General;  Laterality: N/A;    CYSTOSCOPY W/ RETROGRADES Bilateral 06/22/2021   Procedure: CYSTOSCOPY WITH RETROGRADE PYELOGRAM;  Surgeon: Marcine Matar, MD;  Location: [facility] [place];  Service: Urology;  Laterality: Bilateral;   CYSTOSCOPY WITH LITHOLAPAXY N/A 02/26/2013   Procedure: CYSTOSCOPY WITH LITHOLAPAXY;  Surgeon: Marcine Matar, MD;  Location[facility];  Service: Urology;  Laterality: N/A;   ENDOSCOPIC RETROGRADE CHOLANGIOPANCREATOGRAPHY (ERCP) WITH PROPOFOL N/A 02/22/2022   Procedure: ENDOSCOPIC RETROGRADE CHOLANGIOPANCREATOGRAPHY (ERCP) WITH PROPOFOL;  Surgeon: Jeani Hawking, MD;  Location: [facility] ENDOSCOPY;  Service: Gastroenterology;  Laterality: N/A;   EYE SURGERY  Jan. 2016   cataract surgery both eyes   INGUINAL HERNIA REPAIR Right 11-08-2006   IR KYPHO EA ADDL LEVEL THORACIC OR LUMBAR  02/12/2021   IR RADIOLOGIST EVAL & MGMT  02/18/2021   MASS EXCISION N/A 03/03/2016   Procedure: EXCISION OF BACK  MASS;  Surgeon: Almond Lint, MD;  Location: [facility];  Service: General;  Laterality: N/A;   MOHS SURGERY Left 1/ 2016   Dr Margo Aye-- Basal cell   PROSTATE SURGERY     REMOVAL OF STONES  02/22/2022   Procedure: REMOVAL OF STONES;  Surgeon: Jeani Hawking, MD;  Location: [facility] ENDOSCOPY;  Service: Gastroenterology;;   Dennison Mascot  02/22/2022   Procedure: Dennison Mascot;  Surgeon: Elnoria Howard,  Luisa Hart, MD;  Location: [facility] ENDOSCOPY;  Service: Gastroenterology;;   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-C N/A 06/22/2021   Procedure: TRANSURETHRAL RESECTION OF BLADDER TUMOR;  Surgeon: Marcine Matar, MD;  Location: [facility];  Service: Urology;  Laterality: N/A;   TRANSURETHRAL RESECTION OF PROSTATE N/A 02/26/2013   Procedure: TRANSURETHRAL RESECTION OF THE PROSTATE WITH GYRUS INSTRUMENTS;  Surgeon: Marcine Matar, MD;  Location: [facility];  Service: Urology;  Laterality: N/A;   TRANSURETHRAL RESECTION OF PROSTATE N/A 06/22/2021    Procedure: TRANSURETHRAL RESECTION OF THE PROSTATE (TURP);  Surgeon: Marcine Matar, MD;  Location: [facility];  Service: Urology;  Laterality: N/A;   Patient Active Problem List   Diagnosis Date Noted   Dysuria 08/03/2022   Nonintractable headache 07/01/2022   Bilateral impacted cerumen 06/24/2022   Rash 06/15/2022   Postoperative ileus 02/27/2022   Ileus, postoperative 02/26/2022   Choledocholithiasis 02/19/2022   DNR (do not resuscitate) 02/19/2022   Left elbow pain 01/26/2022   Mid back pain on left side 01/26/2022   Rib pain 01/26/2022   Acute pain of left shoulder 11/12/2021   Leukocytes in urine 11/12/2021   Urinary frequency 11/12/2021   Thrush 10/08/2021   Hemiplegia, dominant side S/P CVA (cerebrovascular accident) 09/11/2021   Insomnia    Prediabetes    Acute 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    Basal ganglia infarction 07/29/2021   Transaminitis 07/27/2021   UTI (urinary tract infection) 07/27/2021   CVA (cerebral vascular accident) 07/27/2021   Fall 07/27/2021   Hyperglycemia 07/27/2021   Cholelithiasis 07/27/2021   Hypoxia 07/27/2021   Nausea and vomiting 07/27/2021   Acute metabolic encephalopathy 07/27/2021   Normocytic anemia 07/27/2021   Chronic back pain 07/27/2021   Malignant neoplasm of overlapping sites of bladder 06/22/2021   Closed fracture of first lumbar vertebra with routine healing 02/03/2021   Closed fracture of multiple ribs 11/18/2020   Anxiety 01/29/2020   Leg pain, bilateral 01/29/2020   Ingrown toenail 07/13/2019   Lumbar spondylosis 05/02/2018   Pain in left knee 03/09/2018   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   Trochanteric bursitis of left hip 01/16/2018   Preventative health care 09/26/2017   HTN (hypertension) 07/19/2015   Hyperlipidemia 07/19/2015   Great toe pain 02/11/2014   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   Obesity (BMI 30-39.9) 06/25/2013   Renal  insufficiency 06/25/2013   Weakness of left arm 06/25/2013   Sebaceous cyst 03/03/2011   Sprain of lumbar region 07/31/2010   Rib pain, left 08/29/2009   Carotid artery stenosis, asymptomatic, bilateral 05/02/2009   Eczema, atopic 05/31/2008   Vitamin D deficiency 03/01/2008   BPH (benign prostatic hyperplasia) 08/06/2007   Fasting hyperglycemia 12/21/2006   History of right MCA infarct 2006    ONSET DATE: 06/29/22  REFERRING DIAG:  Diagnosis  I63.9 (ICD-10-CM) - Cerebrovascular accident (CVA), unspecified mechanism (HCC)  W19.XXXD (ICD-10-CM) - Fall, subsequent encounter    THERAPY DIAG:  Muscle weakness (generalized)  Hemiplegia and hemiparesis following cerebral infarction affecting left non-dominant side  Other lack of coordination  Attention and concentration deficit  Other symptoms and signs involving cognitive functions following cerebral infarction  Rationale for Evaluation and Treatment: Rehabilitation  SUBJECTIVE:  SUBJECTIVE STATEMENT: no falls ,pleasant mood, no new complaints PERTINENT HISTORY:  LYNDA CAPISTRAN is a 82 year old man with dementia, CKD, HTN, CAD, HLD and history of CVA  (2006, 2021), Parkinson's  PAIN:  Are you having pain? No  PRECAUTIONS: Fall  WEIGHT BEARING RESTRICTIONS: No  FALLS: Has patient fallen in last 6 months? No  LIVING ENVIRONMENT: Lives with: lives with their family and lives with their spouse Lives in: House/apartment Stairs:  1 brick high step Has following equipment at home: Environmental consultant - 2 wheeled, Wheelchair (manual), Shower bench, bed side commode, Grab bars, and hospital bed, sliding pad for car  PLOF: Independent with basic ADLs and Independent with household mobility without device  PATIENT GOALS: Walk around the block with his  wife. Be able to use the dining room chair rather than have to use the W/C. Increased I with bed mobility and simple tasks at home like make some coffee or brush his teeth, Step over tub to shower, has bench, but he can't really use it. Up and down the one step to get to back deck.  OBJECTIVE:   DIAGNOSTIC FINDINGS:  MRI 2021 with degenerative changes in lumbar spine, L3-4 R subarticular stenosis  COGNITION: Overall cognitive status: History of cognitive impairments - at baseline   SENSATION: Not tested  COORDINATION: Moderately impaired BLE   MUSCLE LENGTH: Hamstrings: severely restricted B Thomas test: Severely restricted B.  POSTURE: rounded shoulders, forward head, increased thoracic kyphosis, posterior pelvic tilt, and flexed trunk   LOWER EXTREMITY ROM:   BLE extremely stiff throughout, limited hip ROM in all planes, B knees limited in extension.   LOWER EXTREMITY MMT:  3+/5 throughout. Unable to determine accuracy of MMT due to cognition. Functionally demosntrates poor coordinated activation and poor muscular endurance.   BED MOBILITY: Min A for Supine<> Sit. Patient was using a ladder in his hospital bed and could move MI.  TRANSFERS: Min A, mod VC, tends to lean back.  CURB: Min A-Has one step out to his deck.  STAIRS: N/A   GAIT: Gait pattern: step to pattern, decreased arm swing- Right, decreased arm swing- Left, decreased step length- Right, decreased step length- Left, shuffling, festinating, trunk flexed, narrow BOS, poor foot clearance- Right, and poor foot clearance- Left Distance walked: 71' Assistive device utilized: Environmental consultant - 2 wheeled Level of assistance: Occasional min A Comments: mod VC to stay close to walker, take longer steps.  FUNCTIONAL TESTS:  5 times sit to stand: 38.23  09/29/22  could not performs 5XSTS Timed up and go (TUG): 47.91  with RW, 07/21/22 TUG 29  seconds without device, 09/29/22 25 seconds  TODAY'S TREATMENT:                                                                                                                                DATE:  10/04/22: Gait outside, 600',one rest break, down up 4 curbs, on grass, uneven terrain  From elevated mat table, sit to stand with dowel rod held horizontally, sit to stand Also with green mediball, sit to stand Bicycle, level 4, 6 min Standing for shoulder extension with pulley, 5#, 10 reps each Side stepping along mat table, with B HHA 6 x Standing for catching/throwing beach ball, mass practice Seated for large blue therapeutic ball forward stretch, cues to keep L hand on ball, also side to side rocking.  09/29/22 Gait outside total 600 feet with 2 rests some walking in grass, uneven terrain See TUG, 5X STS above Worked on bed mobility and on our harder surfaces he is independent with in and out of bed and can position himself Volleyball  Side stepping, backward walking  09/27/22 Nustep level 5 x 5 minutes Gait outside halfway around the parking [place] then rest, then back to this building and a rest outsie and then in the grass around to the front door with CGA/HHA on the right and some cues for step length Standing in Pbars marching and hip abduction 2x10 each Fwd/bkwd walking in the Pbars Seated volleyball Standing kicks Direction changes Seated 6" clears to practice in and out of car Gait back out to the car  09/22/2022 Nustep level 5 x 5 minutes Bike level 5 x 6 minutes Gait outside 1/2 parking [place] then sit and rest and then back around, right HHA and use of gait belt Standing straight arm pulls 5# one at a time Sit to stand with weighted ball and a higher level seating surface Standing left leg 4" toe clears Standing with right foot on 4" step for weight bearing and use of the left leg Standing and turning picking up cones and placing them other areas Folding laundry from one surface and turning and putting on another surface, needs some cues to use the left  arm   09/20/22: NuStep L4 x 6 minutes Stair taps with opposite legs, BUE support, x 5 each, CGA Long steps across air ex planks placed horizontally, UUE support on rail of parallel bars, cues to face forward, support weight with R hand on bar.  Repeat with side stepping each direction Practiced sit to stand in parallel bars, emphasize forward weight shift, able to rise with CGA Moved to mat to continue forward weight shifts and sit to stand, CGA Ambulated x 70', with CGA, x 3, in between sessions at ll bars.festinating gait . Seated forward trunk leans, with large therapeutic ball  2 sets 10 reps Seated forward rolls with ball , and press ups, partial standing , 2 sets 5 reps.  09/16/22 NuStep L4 x 6 minutes Functional re-assessment for PN-see goals Stair taps with opposite legs, BUE support, x 5 each, CGA Long steps across air ex planks placed horizontally, UUE support on rail of parallel bars. Repeat with side stepping each direction Standing weight shifts on air ex pad, no UE support, CGA, reluctant to weight shift forward and back. Practiced sit to stand in parallel bars, emphasize forward weight shift, able to rise with CGA Moved to mat to continue forward weight shifts and sit to stand, CGA Ambulated x 50', improved upright posture and foot clearance.  09/14/22 NuStep L5 x63mins UBE L2 forwards and back  Volleyball hits sitting 2x10 each hand  Leg ext 5# 2x10 HS curls10# 2x10 Ball squeezes 2x10 Hip abduction with green band 2x10  09/07/22 Nustep level 5 x 6 minutes  Tmill  MPH CGA x 1 minute Gait outside around the parking [place] with one rest  break Stairs step over step In pbars marching and hip abduction Walking forward and backward in pbars Gait outside in the grass  09/02/22 Nustep level 5 x 5 minutes Walking HHA outside halfway around the parking [place] and then rest on table, then back in 2.5# on the left ankle having him sit and raise foot up and abduct to a 6"  step on the left Bed mobility rolling side to side [place], partial sit ups Walk 300 feet with HHA a lot of cues for the step length Ball kicks Volley ball standing and sitting  PATIENT EDUCATION: Education details: POC Person educated: Patient and Spouse Education method: Explanation Education comprehension: verbalized understanding  HOME EXERCISE PROGRAM: TBD  GOALS: Goals reviewed with patient? Yes  SHORT TERM GOALS: Target date: 07/19/22  I with initial HEP Baseline: Goal status: ongoing  2. Decrease 5 x STS to < 25 sec to demonstrate improved functional strength  Baseline: 38 sec  Goal status: 08/09/22 14 sec-met  3. Decrease TUG to < 35 sec to demonstrate improved functional balance  Baseline: 47 sec  Goal status: 15 seconds 09/29/22 met  4. Perform sit <> stand transfers from average chair height with S and VC to be able to use the swivel chair at his dining table.  Baseline: Min A  Goal status: 09/16/22-Inconsistently performs  with S from average height. At times requires CGA to cue forward weight shift. ongoing  LONG TERM GOALS: Target date: 09/29/22  Patient will be able to step over his tub at home using grab bars for stability with CGA to access his shower. Baseline: Unable- has bench, but wife reports he steps over then sits. Goal status: 09/16/22-met  2.  Decrease 5 x STS to < 18 sec to demonstrate improved functional strength Baseline:  Goal status: not met 62 seconds and able to do only 3 09/29/22  3.  Decrease TUG to < 25 sec to demonstrate improved functional balance Baseline:  Goal status: 09/29/22 15 seconds  4.  Patient will perform his bed mobility with MI. Baseline: Min A Goal status: on hard surface mat he is independent but on soft bed at home he struggles  5.  Patient will ambulate at least 400' with LRAD, CGA, demonstrate improved upright posture, longer step length. Baseline:  Goal status: 09/16/22-Amb >400', but began to fatigue after about  300' with more flexed posture, L foot shuffling more, unable to maintain safe technique. ongoing  6.  Patient will manage up and down a single step with LRAD, CGA to access his deck at home. Baseline: Unable Goal status: 09/16/22-Up and down 1 step with U rail. met  ASSESSMENT:  CLINICAL IMPRESSION: Patient was able to demonstrate improved overall endurance, walked a long distance outdoors today with HHA.  Was pretty stable with the ball toss as well.   Fatigued by end of session.  OBJECTIVE IMPAIRMENTS: Abnormal gait, decreased activity tolerance, decreased balance, decreased cognition, decreased coordination, decreased endurance, decreased mobility, difficulty walking, decreased ROM, decreased strength, decreased safety awareness, impaired flexibility, impaired UE functional use, improper body mechanics, and postural dysfunction.   ACTIVITY LIMITATIONS: carrying, lifting, bending, sitting, standing, squatting, stairs, transfers, bed mobility, bathing, toileting, dressing, hygiene/grooming, and locomotion level  PARTICIPATION LIMITATIONS: meal prep, cleaning, laundry, interpersonal relationship, shopping, and community activity  PERSONAL FACTORS: Age, Past/current experiences, and 3+ comorbidities: CVA, Parkinson's traits, dementia  are also affecting patient's functional outcome.   REHAB POTENTIAL: Good  CLINICAL DECISION MAKING: Evolving/moderate complexity  EVALUATION COMPLEXITY: Moderate  PLAN:  PT FREQUENCY: 2x/week  PT DURATION: 12 weeks  PLANNED INTERVENTIONS: Therapeutic exercises, Therapeutic activity, Neuromuscular re-education, Balance training, Gait training, Patient/Family education, Self Care, Joint mobilization, Stair training, Moist heat, and Manual therapy  PLAN FOR NEXT SESSION: sent renewal to MD Stacie Glaze, PT 10/04/22 4:15 PM

## 2022-10-05 ENCOUNTER — Ambulatory Visit: Payer: PPO | Admitting: Occupational Therapy

## 2022-10-05 ENCOUNTER — Encounter: Payer: Self-pay | Admitting: Occupational Therapy

## 2022-10-05 DIAGNOSIS — R41840 Attention and concentration deficit: Secondary | ICD-10-CM

## 2022-10-05 DIAGNOSIS — I69354 Hemiplegia and hemiparesis following cerebral infarction affecting left non-dominant side: Secondary | ICD-10-CM

## 2022-10-05 DIAGNOSIS — I69318 Other symptoms and signs involving cognitive functions following cerebral infarction: Secondary | ICD-10-CM

## 2022-10-05 DIAGNOSIS — M6281 Muscle weakness (generalized): Secondary | ICD-10-CM | POA: Diagnosis not present

## 2022-10-05 DIAGNOSIS — M25512 Pain in left shoulder: Secondary | ICD-10-CM

## 2022-10-05 DIAGNOSIS — R278 Other lack of coordination: Secondary | ICD-10-CM

## 2022-10-05 NOTE — Therapy (Unsigned)
OUTPATIENT OCCUPATIONAL THERAPY TREATMENT NOTE  Patient Name: William [name] MRN: [id number] DOB:Aug 10, 1940, 82 y.o., male Today's Date: 10/05/2022  PCP & REFERRING PROVIDER:  [name], [name], DO    END OF SESSION:  OT End of Session - 10/05/22 1528     Visit Number 20    Number of Visits 32    Date for OT Re-Evaluation 11/16/22    Authorization Type [employer]    Authorization - Visit Number 20    Progress Note Due on Visit 20    OT Start Time 1530    OT Stop Time 1615    OT Time Calculation (min) 45 min    Activity Tolerance Patient tolerated treatment well    Behavior During Therapy [facility] for tasks assessed/performed                        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   HTN (hypertension) 07/19/2015   Hyperlipidemia    Major neurocognitive disorder 01/09/2014   Mild, related to stroke history   Nocturia    Renal insufficiency 06/25/2013   S/P carotid endarterectomy    BILATERAL ICA--  PATENT PER DUPLEX  05-19-2012   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/26/2017   Right calf   Urinary frequency    Vitamin D deficiency    Past Surgical History:  Procedure Laterality Date   APPENDECTOMY  AS CHILD   CARDIOVASCULAR STRESS TEST  03-27-2012  DR CRENSHAW   LOW RISK LEXISCAN STUDY-- PROBABLE NORMAL PERFUSION AND SOFT TISSUE ATTENUATION/  NO ISCHEMIA/ EF 51%   CAROTID ENDARTERECTOMY Bilateral LEFT  11-12-2008  DR GREG HAYES   RIGHT ICA  2006  ([facility])   CHOLECYSTECTOMY N/A 02/23/2022   Procedure: LAPAROSCOPIC  CHOLECYSTECTOMY;  Surgeon: Quentin Ore, MD;  Location: [facility] OR;  Service: General;  Laterality: N/A;   CYSTOSCOPY W/ RETROGRADES Bilateral 06/22/2021   Procedure: CYSTOSCOPY WITH RETROGRADE PYELOGRAM;  Surgeon: Marcine Matar, MD;  Location: [facility];  Service: Urology;  Laterality: Bilateral;   CYSTOSCOPY WITH LITHOLAPAXY N/A 02/26/2013   Procedure: CYSTOSCOPY WITH LITHOLAPAXY;  Surgeon: Marcine Matar, MD;  Location: [facility];  Service: Urology;  Laterality: N/A;   ENDOSCOPIC RETROGRADE CHOLANGIOPANCREATOGRAPHY (ERCP) WITH PROPOFOL N/A 02/22/2022   Procedure: ENDOSCOPIC RETROGRADE CHOLANGIOPANCREATOGRAPHY (ERCP) WITH PROPOFOL;  Surgeon: Jeani Hawking, MD;  Location: [facility] ENDOSCOPY;  Service: Gastroenterology;  Laterality: N/A;   EYE SURGERY  Jan. 2016   cataract surgery both eyes   INGUINAL HERNIA REPAIR Right 11-08-2006   IR KYPHO EA ADDL LEVEL THORACIC OR LUMBAR  02/12/2021   IR RADIOLOGIST EVAL & MGMT  02/18/2021   MASS EXCISION N/A 03/03/2016   Procedure: EXCISION OF BACK  MASS;  Surgeon: Almond Lint, MD;  Location: [facility];  Service: General;  Laterality: N/A;   MOHS SURGERY Left 1/ 2016   Dr Margo Aye-- Basal cell   PROSTATE SURGERY     REMOVAL OF STONES  02/22/2022   Procedure: REMOVAL OF STONES;  Surgeon: Jeani Hawking, MD;  Location: [facility] ENDOSCOPY;  Service: Gastroenterology;;   Dennison Mascot  02/22/2022   Procedure: Dennison Mascot;  Surgeon: Jeani Hawking, MD;  Location: [facility] ENDOSCOPY;  Service: Gastroenterology;;   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-C N/A 06/22/2021   Procedure: TRANSURETHRAL RESECTION OF BLADDER TUMOR;  Surgeon: Marcine Matar, MD;  Location[facility];  Service: Urology;  Laterality: N/A;   TRANSURETHRAL RESECTION OF PROSTATE N/A 02/26/2013   Procedure: TRANSURETHRAL RESECTION OF THE PROSTATE WITH GYRUS INSTRUMENTS;  Surgeon: Marcine Matar, MD;  Location: [facility];  Service: Urology;  Laterality: N/A;   TRANSURETHRAL RESECTION OF PROSTATE N/A 06/22/2021   Procedure: TRANSURETHRAL RESECTION OF THE PROSTATE (TURP);  Surgeon: Marcine Matar, MD;  Location: [facility];  Service: Urology;  Laterality: N/A;   Patient Active Problem List   Diagnosis Date Noted   Dysuria 08/03/2022   Nonintractable headache 07/01/2022   Bilateral impacted cerumen 06/24/2022   Rash 06/15/2022   Postoperative ileus 02/27/2022   Ileus, postoperative 02/26/2022   Choledocholithiasis 02/19/2022   DNR (do not resuscitate) 02/19/2022   Left elbow pain 01/26/2022   Mid back pain on left side 01/26/2022   Rib pain 01/26/2022   Acute pain of left shoulder 11/12/2021   Leukocytes in urine 11/12/2021   Urinary frequency 11/12/2021   Thrush 10/08/2021   Hemiplegia, dominant side S/P CVA (cerebrovascular accident) 09/11/2021   Insomnia    Prediabetes    Acute 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    Basal ganglia infarction 07/29/2021   Transaminitis 07/27/2021   UTI (urinary tract infection) 07/27/2021   CVA (cerebral vascular accident) 07/27/2021   Fall 07/27/2021   Hyperglycemia 07/27/2021   Cholelithiasis 07/27/2021   Hypoxia 07/27/2021   Nausea and vomiting 07/27/2021   Acute metabolic encephalopathy 07/27/2021   Normocytic anemia 07/27/2021   Chronic back pain 07/27/2021   Malignant neoplasm of overlapping sites of bladder 06/22/2021   Closed fracture of first lumbar vertebra with routine healing 02/03/2021   Closed fracture of multiple ribs 11/18/2020   Anxiety 01/29/2020   Leg pain, bilateral 01/29/2020   Ingrown toenail 07/13/2019   Lumbar spondylosis 05/02/2018   Pain in left knee 03/09/2018   Osteoarthritis of left hip 01/16/2018   Trochanteric bursitis of left hip 01/16/2018   Preventative health care 09/26/2017   HTN (hypertension) 07/19/2015   Hyperlipidemia 07/19/2015   Great toe pain  02/11/2014   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   Obesity (BMI 30-39.9) 06/25/2013   Renal insufficiency 06/25/2013   Weakness of left arm 06/25/2013   Sebaceous cyst 03/03/2011   Sprain of lumbar region 07/31/2010   Rib pain, left 08/29/2009   Carotid artery stenosis, asymptomatic, bilateral 05/02/2009   Eczema, atopic 05/31/2008   Vitamin D deficiency 03/01/2008   BPH (benign prostatic hyperplasia) 08/06/2007   Fasting hyperglycemia 12/21/2006   History of right MCA infarct 2006    ONSET DATE: Acute exacerbation of chronic symptoms (most recent hospitalization 02/26/22 - 03/12/22, followed by SNF and home health)   REFERRING DIAG:  I63.9 (ICD-10-CM) - Cerebrovascular accident (CVA), unspecified mechanism (HCC)  W19.XXXD (ICD-10-CM) - Fall, subsequent encounter    THERAPY DIAG:  Muscle weakness (generalized)  Hemiplegia and hemiparesis following cerebral infarction affecting left non-dominant side  Other lack of coordination  Attention and concentration deficit  Other symptoms and signs involving cognitive functions following cerebral infarction  Acute pain of left  shoulder  Rationale for Evaluation and Treatment: Rehabilitation  PERTINENT HISTORY: PMHx includes L basal ganglia and corona radiata infarct 07/27/21, L lower homonymous quadrantanopia; R MCA infarct in 2006 w/ L-sided hemiparesis, NCD, carotid artery disease (bilateral), HTN, HLD,CKD3, vascular dementia, and hx of bladder cancer. He has had falls in 2023, fracturing his clavicle in August, then he had stomach surgery in Sept 2023. He has He needs assist at baseline for ADLs. When last documented in acute rehab after having his gall-bladder removed, (03/09/22) he required moderate to maximum assist x2 for most ADLs (more assist for ADLs the require standing), and they recommended rehab in skilled nursing setting. He apparently got Covid at some point and also attended a SNF. Once he D/C home, he had some  episodes of combativeness at night  with "sun-downing."  He is communicative and appropriate, though appears easily confused and distracted, not fully oriented to time and place (Ax2, states date is 22 and cannot say what city he lives in). His wife provides most details, and he is participative, though a poor historian at times. In a nut shell- he has been suffering from dementia and post-stroke effects for years, and his issues were recently exacerbated in Sept of last year after abdominal surgery. He has less use of Lt "stroke side" and new, ritualistic "ticks" in mouth and Rt hand/arm.  Wife states that they do have a caregiver come M, W, F for 4 hours in the middle of the day to mainly help him bathe and dress, which usually tires him out. He has been more combative towards the end of the day as well.   PRECAUTIONS: Fall and Other: Lt hemi, poor cognition, LATEX and adhesive allergies, etc.  ;WEIGHT BEARING RESTRICTIONS: No   SUBJECTIVE:   SUBJECTIVE STATEMENT: Pt reports neck pain  Pt accompanied by:  his wife   PAIN:  Are you having pain? Yes: NPRS scale: 3-4/10 Pain location: L shoulder and neck Pain description: aching Aggravating factors: malpositioning, use Relieving factors: heat     FALLS: Has patient fallen in last 6 months? Yes. Number of falls at least 1 resulting in clavicle fx (now healed)   LIVING ENVIRONMENT: Lives with: lives with their spouse Lives in: House/apartment Has following equipment at home: manual w/c, 2 w/w, weighted utensils, shower bench (for tub), bed cane, hospital bed, raised toilet, and possibly more  PLOF: Needs assistance with ADLs, Needs assistance with homemaking, Needs assistance with gait, and Needs assistance with transfers  PATIENT GOALS: at eval- increase personal hygiene skills (brush teeth, shaving)  prepare instant coffee or microwave meal improve functional cognitive skills to play games, etc.,   increase Lt arm usage try to help  decrease Rt hand/arm perseveration (flicking, tapping, noise making) Improve fnl tranfers in/out of multiple seats (tub bench, swivel chair, etc.)    OBJECTIVE: (All objective assessments below are from initial evaluation on: 07/07/22 unless otherwise specified.)   HAND DOMINANCE: Right  ADLs: Overall ADLs: Wife states decreased ability since hospitalization in Sept 23.  Transfers/ambulation related to ADLs: Min A transfer, CGA for mobility Eating: set up Grooming: Mod A- needs assist with oral care, shaving, but can comb his hair UB Dressing: Min A  LB Dressing: Dep  Toileting:  Mod  Bathing: Mod  Tub Shower transfers: Min A  Equipment: Emergency planning/management officer, Grab bars, and Feeding equipment  IADLs: completely dependent for years now, except some ability to use microwave which is now not possible after hospitalization  MOBILITY STATUS: Needs Assist: CGA - Min A, Hx of falls, Festination, difficulty with turns, and neglect on Lt side  POSTURE COMMENTS:  rounded shoulders, forward head, and weight shift left  ACTIVITY TOLERANCE: Activity tolerance: he became tired and somewhat agitated by the end of the OT evaluation today (after PT and OT about 2.5hours with mostly sedate activities in seated positions)  FUNCTIONAL OUTCOME MEASURES: [facility] 17/30  showing problems with visuospatial/executive function, attention, fluency, and orientation. (at least 26/30 is needed to be considered "normal")  [employer] in ADLs: 1/6  (very dependent)  UPPER EXTREMITY ROM:    Active ROM Right eval Left eval  Shoulder scaption 100 85  Shoulder abduction    Shoulder adduction    Shoulder extension    Shoulder internal rotation Reaches small of back Reaches to hip  Shoulder external rotation Reaches back of head  Reaches up to ear  Elbow flexion 150 130  Elbow extension (-10*)  (-35*)  Wrist flexion 35 20  Wrist extension 50 10  (Blank rows = not tested)  UPPER EXTREMITY MMT:    Tested  grossly as "push" and "pull": UE push 4-/5, UE pull 4/5  HAND FUNCTION: Grip strength: Right: 30 lbs; Left: 20 lbs  COORDINATION: Finger Nose Finger test: mildly ataxic but fairly accurate  Box and Blocks:  Right 11blocks, Left 5blocks  MUSCLE TONE:  Rt- normal, Lt- decreased and worsened by inattention/disuse  COGNITION: Overall cognitive status: Impaired and Difficulty to assess due to: severity of deficits; Poor attention, fluency, orientation, and executive function. Recall is fairly good as well as abstraction.  Seems to have personality changes due to vascular dementia- like an OCD type volitional perseveration of tapping with Rt hand that he can somewhat control.  He did become angry and agitated at the end of the session performing car transfer with spouse: he did shout at her.  VISION: Subjective report: no new changes per pt/spouse, uses "cheaters"   VISION ASSESSMENT: Visual fields seem intact though he had latent responses on the left side showing likely inattention/and/neglect, tracking somewhat delayed horizontally and vertically  PERCEPTION: Impaired: Inattention/neglect: does not attend to left visual field and does not attend to left side of body, Body scheme: poor quality of recognition with shaving, etc., and Spatial orientation: decreased in print and in orientation, especially to Lt side  PRAXIS: Not tested specifically but no overt/stated issues using pen, wearing glasses ,etc.   OBSERVATIONS: Pt taps and flicks and does seem to volitionally perseverate on making a rhythm/noise with Rt hand. He states he is aware of it and chooses to do it.  He does also seem to have mild resting and intention tremor.    TODAY'S TREATMENT:     DATE:10/05/22- Pt reports neck and shoulder pain today. It appears related to posture and left shoulder hike. Supine hotpack applied to neck x approximately 10 mins while pt performed closed chain shoulder flexion and chest press. Mod  facilitation. Functional reaching with left and right UE's to retrieve and replace beanbags. Standing with hands on fitter poles pt rocked forwards and backwards with shoulder flexion then retraction mod v.c and min facilitation. Attempted weight shifts side to side, max v.c and target for weightshifts. Seated tapping boomwhakers to target , mod v.c  for shoulder flexion and functional reach to targets. Pt practiced opening coffee container several times, accurately, in standing and seated pt's wife reports pt sometimes has difficulty at home. Opening the newspaper with min -mod v.c for  LUE functional use.   09/30/22- seated on mat functional reaching with right and left UE's to grasp and release cones to target with trunk rotation followed by standing to place cones in overhead cabinet with left and right UE's min v.c and facilitaiton for LUE use. Seated playing connect 4 using left and right UE's to place checkers in frame, mod v.c for LUE use, pt won the game.  Placing large pegs into pegboard in seated with RUE with cognitve component, then removing with LUE, min-mod v.c for LUE use. Arm bike x 4 mins level 1 for conditioning.   09/28/22 Pt's wife brought in pt's electric toothbrush, tooothpaste and cup.Pt stood at the sink with supervision to apply toothpaste to brush, min facilitation/ v.c for using LUE to stabilize toothbrush, pt required min A , and min v.c intially to open toothpaste. Therapist marked toothpaste for verbal cue and pt opened a second time with increased success. Pt applied toothpaste to brush and turned brush on to begin brushing teeth. Pt performed the task with mod v.c and min facilitation/ hand over hand assist to slow down brushing speed. Pt was also cued to brush left side and cued to pause when he was noted to be brushing too hard. Pt sat on the mat using a small portable mirror to shave with electric razor. Pt required mod A for task for thoroughness. Pt was cued to shave  specific areas, and therapist facilitated pt's hand positioning so that razor was flatter on face. Therapist assisted pt with shaving mustache area and pt was noted to have increased difficulty this this portion. Seated at table card matching task to match by number or suit, min v.c and mod facilitation for LUE use.  09/23/22- doffing jacket seated on mat, mod A, v.c Standing at countertop Pt simulated opening OJ container and closing with supervision.  Dynamic functional step and reach to targets on left and right sides, supervision and min v.c Functional reaching inially in standing to remove wooden pegs from pegboard with LUE, mod v.c, due to fatigue pt transitioned to seated.   Replacing pegs into pegboard with trunk rotation and min-mod v.c, min facilitation for LUE use. Closed chain shoulder flexion with ball, mod failitation/ v.c.  Donning jacket with min A and mod v.c  PATIENT EDUCATION: Education details: See treatment section above for details,  Person educated: Patient and Spouse Education method: Explanation, Demonstration, Tactile cues, and Verbal cues Education comprehension: verbalized understanding, returned demonstration, verbal cues required, tactile cues required, and needs further education  HOME EXERCISE PROGRAM: Access Code: [email or web address] URL: [email or web address] Date: 07/21/2022 Prepared by: Fannie Knee Exercises - Seated Shoulder Flexion AAROM with Dowel  - 4-6 x daily - 1 sets - 10-15 reps - Seated Shoulder Abduction AAROM with Dowel  - 4-6 x daily - 1 sets - 10-15 reps - Seated Shoulder External Rotation AAROM with Dowel  - 4-6 x daily - 1 sets - 10-15 reps - Seated Bicep Curls with Bar  - 4-6 x daily - 1 sets - 10-15 reps - Seated Shoulder Row with Anchored Resistance  - 4-6 x daily - 1 sets - 10-15 reps     GOALS: Goals reviewed with patient? Yes   SHORT TERM GOALS: (STG required if POC>30 days) Target Date: 09/23/22   Pt will demo/state  understanding of initial HEP (or home activity suggestions to maintain function as appropriate) to improve functional skills. Goal status: 07/28/22- Met (addressed in past 2 sessions) 2. Pt will donn a  t-shirt consistently with min A and v.c   Goal status: met , per wife's report 3. Pt will demonstrate ability to open the newspaper  with supervision min v.c Goal status:  ongoing, inconsistent, min , mod v.c at times 10/05/22 4.  Pt will consistently sustain focus to a simple functional task  in a minimally distracting environment x 6 mins with no more than 1 re-direct(ie: eating, functional task in clinic)  Goal status:  improved but inconsistent  however pt has been able to sutain attention to several functional tasks in clinic  x 6 mins several times recently 10/05/22  5. Pt will consistently open containers with only supervision and min v.c           Goal status: partially met,  opens most containers at this level  in the clinic, however sometimes has difficulty with coffee containers at home sometimes, per wife's report. 10/05/22    LONG TERM GOALS: Target Date: 11/16/22  Pt will improve functional ability by decreased impairment per Trinna Balloon assessment from 1/6 to 2/6 or better, for better quality of life. Goal status:  not met 1/6  2.  Caregiver will state his grooming ability/hygiene ability improved from moderate assist to supervision assist. Goal status: not met-Pt continues to require mod A.  3.  He will show better functional use of left arm by improved box and blocks Test score from 5 blocks to at least 10 blocks. Goal status:not met but improved 8 blocks  4.  Pt will show ability to microwave instant coffee or small meal safely, to help decrease caregiver burden. Goal status: partially met, pt is able to microwave coffee with supervision, however occasional min A to open containers 5.  He will show improved cognitive ability enough to play a simple game with OT and  caregiver. Goal status: approximating goal, pt has played a game of uno at home with family.  6.  Caregiver will state at least 4 ways to decrease perseveration with right arm, also stating that this behaviors at least somewhat better in the home now. Goal status: deferred perseverative behaviors have improved.  7. Pt will donn a flannel button front shirt consistently with min A and min v.c Baseline: mod-max Goal status:  ongoing, min-mod A, min-mod v.c 09/15/22 8.  Pt will use his LUE as an active assist for ADLS grossly 40% of the time with min v.c Baseline: uses 25% Goal status:  ongoing 09/15/22  9 Pt will stand at the countertop and perform functional activities  incorporating bilateral UE's with supervision and min v.c  Goal status:  ongoing, min assist at time-09/15/22 10. Pt will back up to a chair and sit down squarely at least 1/3 of the time with min v.c  Goal status: ongoing , performs in clinic with min-mod v.c 09/15/22 11..  Pt will open the newspaper and locate sports section with no more than min v.c                          Goal status:  ongoing, min-mod A at times 09/15/22  ASSESSMENT:  CLINICAL IMPRESSION: For the reporting period of: 08/30/22-10/05/22, Pt is progressing towards goals however he has not fully achieved all short term goals due to inconsistent performance of ADL/ functional activity due to severity of deficits. Pt continues to make small gains towards long and short term goals. Pt continues to progress with improving attention to functional tasks. However pt continues to  require cueing for LUE use. Pt will benefit from continued skilled occupational therapy to address the following deficits: bradykinesia, decreased balance, decreased LUE functional use, cognitive deficits ,pain and decreased coordination in order to maximize pt's safety and I with ADLS/IADLS ian quality of life and to decrease caregiver burden. OT Frequency/DURATION: 2x week x 12 weeks however  anticipate d/c after 8 weeks dependent on progress  PLANNED INTERVENTIONS: self care/ADL training, therapeutic exercise, therapeutic activity, neuromuscular re-education, manual therapy, balance training, functional mobility training, moist heat, cryotherapy, patient/family education, cognitive remediation/compensation, visual/perceptual remediation/compensation, psychosocial skills training, energy conservation, coping strategies training, DME and/or AE instructions, and Re-evaluation  RECOMMENDED OTHER SERVICES: He has already seen PT for balance and functional mobility, he would probably benefit from speech for cognitive issues as well, though OT will be working with cognition somewhat functionally now.  CONSULTED AND AGREED WITH PLAN OF CARE: Patient and family member/caregiver  PLAN FOR NEXT SESSION:    Work towards goals,cognition,  continue to address ADL tasks prn,  Functional reaching, functional use of bilateral UE's in standing,    Keene Breath, OTR/L 3:29 PM 10/05/22

## 2022-10-05 NOTE — Telephone Encounter (Signed)
Received. Virtual scheduled for Friday

## 2022-10-06 ENCOUNTER — Ambulatory Visit: Payer: PPO | Admitting: Physical Therapy

## 2022-10-06 ENCOUNTER — Encounter: Payer: Self-pay | Admitting: Physical Therapy

## 2022-10-06 DIAGNOSIS — I69354 Hemiplegia and hemiparesis following cerebral infarction affecting left non-dominant side: Secondary | ICD-10-CM

## 2022-10-06 DIAGNOSIS — R262 Difficulty in walking, not elsewhere classified: Secondary | ICD-10-CM

## 2022-10-06 DIAGNOSIS — M6281 Muscle weakness (generalized): Secondary | ICD-10-CM

## 2022-10-06 NOTE — Therapy (Signed)
OUTPATIENT PHYSICAL THERAPY NEURO Treatment   Patient Name: William [name] MRN: [id number] DOB:03-24-1941, 82 y.o., male Today's Date: 10/06/2022  PCP: Donato Schultz  DO REFERRING PROVIDER: same  END OF SESSION:  PT End of Session - 10/06/22 1526     Visit Number 26    Date for PT Re-Evaluation 10/30/22    PT Start Time 1525    PT Stop Time 1613    PT Time Calculation (min) 48 min    Activity Tolerance Patient tolerated treatment well    Behavior During Therapy [facility] for tasks assessed/performed                 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   HTN (hypertension) 07/19/2015   Hyperlipidemia    Major neurocognitive disorder 01/09/2014   Mild, related to stroke history   Nocturia    Renal insufficiency 06/25/2013   S/P carotid endarterectomy    BILATERAL ICA--  PATENT PER DUPLEX  05-19-2012   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/26/2017   Right calf   Urinary frequency    Vitamin D deficiency    Past Surgical History:  Procedure Laterality Date   APPENDECTOMY  AS CHILD   CARDIOVASCULAR STRESS TEST  03-27-2012  DR CRENSHAW   LOW RISK LEXISCAN STUDY-- PROBABLE NORMAL PERFUSION AND SOFT TISSUE ATTENUATION/  NO ISCHEMIA/ EF 51%   CAROTID ENDARTERECTOMY Bilateral LEFT  11-12-2008  DR GREG HAYES   RIGHT ICA  2006  ([facility])   CHOLECYSTECTOMY N/A 02/23/2022   Procedure: LAPAROSCOPIC CHOLECYSTECTOMY;  Surgeon: Quentin Ore, MD;  Location: [facility] OR;  Service: General;  Laterality: N/A;   CYSTOSCOPY W/ RETROGRADES Bilateral 06/22/2021    Procedure: CYSTOSCOPY WITH RETROGRADE PYELOGRAM;  Surgeon: Marcine Matar, MD;  Location: [facility];  Service: Urology;  Laterality: Bilateral;   CYSTOSCOPY WITH LITHOLAPAXY N/A 02/26/2013   Procedure: CYSTOSCOPY WITH LITHOLAPAXY;  Surgeon: Marcine Matar, MD;  Location: [facility];  Service: Urology;  Laterality: N/A;   ENDOSCOPIC RETROGRADE CHOLANGIOPANCREATOGRAPHY (ERCP) WITH PROPOFOL N/A 02/22/2022   Procedure: ENDOSCOPIC RETROGRADE CHOLANGIOPANCREATOGRAPHY (ERCP) WITH PROPOFOL;  Surgeon: Jeani Hawking, MD;  Location: [facility];  Service: Gastroenterology;  Laterality: N/A;   EYE SURGERY  Jan. 2016   cataract surgery both eyes   INGUINAL HERNIA REPAIR Right 11-08-2006   IR KYPHO EA ADDL LEVEL THORACIC OR LUMBAR  02/12/2021   IR RADIOLOGIST EVAL & MGMT  02/18/2021   MASS EXCISION N/A 03/03/2016   Procedure: EXCISION OF BACK  MASS;  Surgeon: Almond Lint, MD;  Location: [facility];  Service: General;  Laterality: N/A;   MOHS SURGERY Left 1/ 2016   Dr Margo Aye-- Basal cell   PROSTATE SURGERY     REMOVAL OF STONES  02/22/2022   Procedure: REMOVAL OF STONES;  Surgeon: Jeani Hawking, MD;  Location: [facility] ENDOSCOPY;  Service: Gastroenterology;;   Dennison Mascot  02/22/2022   Procedure: Dennison Mascot;  Surgeon: Jeani Hawking, MD;  Location: [facility] ENDOSCOPY;  Service: Gastroenterology;;  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-C N/A 06/22/2021   Procedure: TRANSURETHRAL RESECTION OF BLADDER TUMOR;  Surgeon: Marcine Matar, MD;  Location: [facility];  Service: Urology;  Laterality: N/A;   TRANSURETHRAL RESECTION OF PROSTATE N/A 02/26/2013   Procedure: TRANSURETHRAL RESECTION OF THE PROSTATE WITH GYRUS INSTRUMENTS;  Surgeon: Marcine Matar, MD;  Location: [facility];  Service: Urology;  Laterality: N/A;   TRANSURETHRAL RESECTION OF PROSTATE N/A 06/22/2021   Procedure: TRANSURETHRAL RESECTION OF THE PROSTATE  (TURP);  Surgeon: Marcine Matar, MD;  Location: [facility];  Service: Urology;  Laterality: N/A;   Patient Active Problem List   Diagnosis Date Noted   Dysuria 08/03/2022   Nonintractable headache 07/01/2022   Bilateral impacted cerumen 06/24/2022   Rash 06/15/2022   Postoperative ileus 02/27/2022   Ileus, postoperative 02/26/2022   Choledocholithiasis 02/19/2022   DNR (do not resuscitate) 02/19/2022   Left elbow pain 01/26/2022   Mid back pain on left side 01/26/2022   Rib pain 01/26/2022   Acute pain of left shoulder 11/12/2021   Leukocytes in urine 11/12/2021   Urinary frequency 11/12/2021   Thrush 10/08/2021   Hemiplegia, dominant side S/P CVA (cerebrovascular accident) 09/11/2021   Insomnia    Prediabetes    Acute 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    Basal ganglia infarction 07/29/2021   Transaminitis 07/27/2021   UTI (urinary tract infection) 07/27/2021   CVA (cerebral vascular accident) 07/27/2021   Fall 07/27/2021   Hyperglycemia 07/27/2021   Cholelithiasis 07/27/2021   Hypoxia 07/27/2021   Nausea and vomiting 07/27/2021   Acute metabolic encephalopathy 07/27/2021   Normocytic anemia 07/27/2021   Chronic back pain 07/27/2021   Malignant neoplasm of overlapping sites of bladder 06/22/2021   Closed fracture of first lumbar vertebra with routine healing 02/03/2021   Closed fracture of multiple ribs 11/18/2020   Anxiety 01/29/2020   Leg pain, bilateral 01/29/2020   Ingrown toenail 07/13/2019   Lumbar spondylosis 05/02/2018   Pain in left knee 03/09/2018   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/16/2018   Trochanteric bursitis of left hip 01/16/2018   Preventative health care 09/26/2017   HTN (hypertension) 07/19/2015   Hyperlipidemia 07/19/2015   Great toe pain 02/11/2014   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   Obesity (BMI 30-39.9) 06/25/2013   Renal insufficiency 06/25/2013   Weakness of left arm 06/25/2013    Sebaceous cyst 03/03/2011   Sprain of lumbar region 07/31/2010   Rib pain, left 08/29/2009   Carotid artery stenosis, asymptomatic, bilateral 05/02/2009   Eczema, atopic 05/31/2008   Vitamin D deficiency 03/01/2008   BPH (benign prostatic hyperplasia) 08/06/2007   Fasting hyperglycemia 12/21/2006   History of right MCA infarct 2006    ONSET DATE: 06/29/22  REFERRING DIAG:  Diagnosis  I63.9 (ICD-10-CM) - Cerebrovascular accident (CVA), unspecified mechanism (HCC)  W19.XXXD (ICD-10-CM) - Fall, subsequent encounter    THERAPY DIAG:  Muscle weakness (generalized)  Hemiplegia and hemiparesis following cerebral infarction affecting left non-dominant side  Difficulty in walking, not elsewhere classified  Rationale for Evaluation and Treatment: Rehabilitation  SUBJECTIVE:  SUBJECTIVE STATEMENT: Patient having neck pain, reports that he woke up with it, it is stiff and tight with pain, pain a 7/10  PERTINENT HISTORY:  GEVORG BRUM is a 82 year old man with dementia, CKD, HTN, CAD, HLD and history of CVA  (2006, 2021), Parkinson's  PAIN:  Are you having pain? No  PRECAUTIONS: Fall  WEIGHT BEARING RESTRICTIONS: No  FALLS: Has patient fallen in last 6 months? No  LIVING ENVIRONMENT: Lives with: lives with their family and lives with their spouse Lives in: House/apartment Stairs:  1 brick high step Has following equipment at home: Environmental consultant - 2 wheeled, Wheelchair (manual), Shower bench, bed side commode, Grab bars, and hospital bed, sliding pad for car  PLOF: Independent with basic ADLs and Independent with household mobility without device  PATIENT GOALS: Walk around the block with his wife. Be able to use the dining room chair rather than have to use the W/C. Increased I with bed mobility  and simple tasks at home like make some coffee or brush his teeth, Step over tub to shower, has bench, but he can't really use it. Up and down the one step to get to back deck.  OBJECTIVE:   DIAGNOSTIC FINDINGS:  MRI 2021 with degenerative changes in lumbar spine, L3-4 R subarticular stenosis  COGNITION: Overall cognitive status: History of cognitive impairments - at baseline   SENSATION: Not tested  COORDINATION: Moderately impaired BLE   MUSCLE LENGTH: Hamstrings: severely restricted B Thomas test: Severely restricted B.  POSTURE: rounded shoulders, forward head, increased thoracic kyphosis, posterior pelvic tilt, and flexed trunk   LOWER EXTREMITY ROM:   BLE extremely stiff throughout, limited hip ROM in all planes, B knees limited in extension.   LOWER EXTREMITY MMT:  3+/5 throughout. Unable to determine accuracy of MMT due to cognition. Functionally demosntrates poor coordinated activation and poor muscular endurance.   BED MOBILITY: Min A for Supine<> Sit. Patient was using a ladder in his hospital bed and could move MI.  TRANSFERS: Min A, mod VC, tends to lean back.  CURB: Min A-Has one step out to his deck.  STAIRS: N/A   GAIT: Gait pattern: step to pattern, decreased arm swing- Right, decreased arm swing- Left, decreased step length- Right, decreased step length- Left, shuffling, festinating, trunk flexed, narrow BOS, poor foot clearance- Right, and poor foot clearance- Left Distance walked: 13' Assistive device utilized: Environmental consultant - 2 wheeled Level of assistance: Occasional min A Comments: mod VC to stay close to walker, take longer steps.  FUNCTIONAL TESTS:  5 times sit to stand: 38.23  09/29/22  could not performs 5XSTS Timed up and go (TUG): 47.91  with RW, 07/21/22 TUG 29  seconds without device, 09/29/22 25 seconds  TODAY'S TREATMENT:                                                                                                                                DATE:  10/06/22 STm to the left neck and upper trap area 2.5# LAQ 2.5# marches Red tband HS curls Red tband hip adduction Red tband hip abduction Red tband ankle exercises Nustep level 4 x 6 minutes Gait outside with HHA on the right 3x150'  09/29/22 Gait outside total 600 feet with 2 rests some walking in grass, uneven terrain See TUG, 5X STS above Worked on bed mobility and on our harder surfaces he is independent with in and out of bed and can position himself Volleyball  Side stepping, backward walking  09/27/22 Nustep level 5 x 5 minutes Gait outside halfway around the parking [place] then rest, then back to this building and a rest outsie and then in the grass around to the front door with CGA/HHA on the right and some cues for step length Standing in Pbars marching and hip abduction 2x10 each Fwd/bkwd walking in the Pbars Seated volleyball Standing kicks Direction changes Seated 6" clears to practice in and out of car Gait back out to the car  09/22/2022 Nustep level 5 x 5 minutes Bike level 5 x 6 minutes Gait outside 1/2 parking [place] then sit and rest and then back around, right HHA and use of gait belt Standing straight arm pulls 5# one at a time Sit to stand with weighted ball and a higher level seating surface Standing left leg 4" toe clears Standing with right foot on 4" step for weight bearing and use of the left leg Standing and turning picking up cones and placing them other areas Folding laundry from one surface and turning and putting on another surface, needs some cues to use the left arm   09/20/22: NuStep L4 x 6 minutes Stair taps with opposite legs, BUE support, x 5 each, CGA Long steps across air ex planks placed horizontally, UUE support on rail of parallel bars, cues to face forward, support weight with R hand on bar.  Repeat with side stepping each direction Practiced sit to stand in parallel bars, emphasize forward weight shift, able to rise with  CGA Moved to mat to continue forward weight shifts and sit to stand, CGA Ambulated x 70', with CGA, x 3, in between sessions at ll bars.festinating gait . Seated forward trunk leans, with large therapeutic ball  2 sets 10 reps Seated forward rolls with ball , and press ups, partial standing , 2 sets 5 reps.  09/16/22 NuStep L4 x 6 minutes Functional re-assessment for PN-see goals Stair taps with opposite legs, BUE support, x 5 each, CGA Long steps across air ex planks placed horizontally, UUE support on rail of parallel bars. Repeat with side stepping each direction Standing weight shifts on air ex pad, no UE support, CGA, reluctant to weight shift forward and back. Practiced sit to stand in parallel bars, emphasize forward weight shift, able to rise with CGA Moved to mat to continue forward weight shifts and sit to stand, CGA Ambulated x 50', improved upright posture and foot clearance.  09/14/22 NuStep L5 x52mins UBE L2 forwards and back  Volleyball hits sitting 2x10 each hand  Leg ext 5# 2x10 HS curls10# 2x10 Ball squeezes 2x10 Hip abduction with green band 2x10  09/07/22 Nustep level 5 x 6 minutes  Tmill  MPH CGA x 1 minute Gait outside around the parking [place] with one rest break Stairs step over step In pbars marching and hip abduction Walking forward and backward in pbars Gait outside in the grass  09/02/22 Nustep level 5 x 5 minutes  Walking HHA outside halfway around the parking [place] and then rest on table, then back in 2.5# on the left ankle having him sit and raise foot up and abduct to a 6" step on the left Bed mobility rolling side to side Bridges, partial sit ups Walk 300 feet with HHA a lot of cues for the step length Ball kicks Volley ball standing and sitting  PATIENT EDUCATION: Education details: POC Person educated: Patient and Spouse Education method: Explanation Education comprehension: verbalized understanding  HOME EXERCISE  PROGRAM: TBD  GOALS: Goals reviewed with patient? Yes  SHORT TERM GOALS: Target date: 07/19/22  I with initial HEP Baseline: Goal status: ongoing  2. Decrease 5 x STS to < 25 sec to demonstrate improved functional strength  Baseline: 38 sec  Goal status: 08/09/22 14 sec-met  3. Decrease TUG to < 35 sec to demonstrate improved functional balance  Baseline: 47 sec  Goal status: 15 seconds 09/29/22 met  4. Perform sit <> stand transfers from average chair height with S and VC to be able to use the swivel chair at his dining table.  Baseline: Min A  Goal status: 09/16/22-Inconsistently performs  with S from average height. At times requires CGA to cue forward weight shift. ongoing  LONG TERM GOALS: Target date: 09/29/22  Patient will be able to step over his tub at home using grab bars for stability with CGA to access his shower. Baseline: Unable- has bench, but wife reports he steps over then sits. Goal status: 09/16/22-met  2.  Decrease 5 x STS to < 18 sec to demonstrate improved functional strength Baseline:  Goal status: not met 62 seconds and able to do only 3 09/29/22  3.  Decrease TUG to < 25 sec to demonstrate improved functional balance Baseline:  Goal status: 09/29/22 15 seconds  4.  Patient will perform his bed mobility with MI. Baseline: Min A Goal status: on hard surface mat he is independent but on soft bed at home he struggles  5.  Patient will ambulate at least 400' with LRAD, CGA, demonstrate improved upright posture, longer step length. Baseline:  Goal status: 09/16/22-Amb >400', but began to fatigue after about 300' with more flexed posture, L foot shuffling more, unable to maintain safe technique. ongoing  6.  Patient will manage up and down a single step with LRAD, CGA to access his deck at home. Baseline: Unable Goal status: 09/16/22-Up and down 1 step with U rail. met  ASSESSMENT:  CLINICAL IMPRESSION: Patient had some significant c/o neck pain ont he left.   He is very gaurded with the shoulder elevated needing cues to relax, did more seated exercises and again cues to relax OBJECTIVE IMPAIRMENTS: Abnormal gait, decreased activity tolerance, decreased balance, decreased cognition, decreased coordination, decreased endurance, decreased mobility, difficulty walking, decreased ROM, decreased strength, decreased safety awareness, impaired flexibility, impaired UE functional use, improper body mechanics, and postural dysfunction.   ACTIVITY LIMITATIONS: carrying, lifting, bending, sitting, standing, squatting, stairs, transfers, bed mobility, bathing, toileting, dressing, hygiene/grooming, and locomotion level  PARTICIPATION LIMITATIONS: meal prep, cleaning, laundry, interpersonal relationship, shopping, and community activity  PERSONAL FACTORS: Age, Past/current experiences, and 3+ comorbidities: CVA, Parkinson's traits, dementia  are also affecting patient's functional outcome.   REHAB POTENTIAL: Good  CLINICAL DECISION MAKING: Evolving/moderate complexity  EVALUATION COMPLEXITY: Moderate  PLAN:  PT FREQUENCY: 2x/week  PT DURATION: 12 weeks  PLANNED INTERVENTIONS: Therapeutic exercises, Therapeutic activity, Neuromuscular re-education, Balance training, Gait training, Patient/Family education, Self Care, Joint mobilization, Stair training,  Moist heat, and Manual therapy  PLAN FOR NEXT SESSION:Continue to work on his balance and function Stacie Glaze, PT 10/06/22 3:33 PM

## 2022-10-07 ENCOUNTER — Ambulatory Visit: Payer: PPO | Admitting: Occupational Therapy

## 2022-10-07 DIAGNOSIS — I69354 Hemiplegia and hemiparesis following cerebral infarction affecting left non-dominant side: Secondary | ICD-10-CM

## 2022-10-07 DIAGNOSIS — I69318 Other symptoms and signs involving cognitive functions following cerebral infarction: Secondary | ICD-10-CM

## 2022-10-07 DIAGNOSIS — M6281 Muscle weakness (generalized): Secondary | ICD-10-CM | POA: Diagnosis not present

## 2022-10-07 DIAGNOSIS — R2689 Other abnormalities of gait and mobility: Secondary | ICD-10-CM

## 2022-10-07 DIAGNOSIS — M25512 Pain in left shoulder: Secondary | ICD-10-CM

## 2022-10-07 DIAGNOSIS — R41840 Attention and concentration deficit: Secondary | ICD-10-CM

## 2022-10-07 DIAGNOSIS — R278 Other lack of coordination: Secondary | ICD-10-CM

## 2022-10-07 NOTE — Therapy (Signed)
OUTPATIENT OCCUPATIONAL THERAPY TREATMENT NOTE  Patient Name: William [name] MRN: [id number] DOB:07/08/1940, 82 y.o., male Today's Date: 10/07/2022  PCP & REFERRING PROVIDER:  [name], [name], DO    END OF SESSION:  OT End of Session - 10/07/22 1549     Visit Number 21    Number of Visits 32    Date for OT Re-Evaluation 11/16/22    Authorization Type [employer]    Authorization - Visit Number 21    Progress Note Due on Visit 30    OT Start Time 1403    OT Stop Time 1445    OT Time Calculation (min) 42 min    Activity Tolerance Patient tolerated treatment well    Behavior During Therapy WFL for tasks assessed/performed                         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   HTN (hypertension) 07/19/2015   Hyperlipidemia    Major neurocognitive disorder 01/09/2014   Mild, related to stroke history   Nocturia    Renal insufficiency 06/25/2013   S/P carotid endarterectomy    BILATERAL ICA--  PATENT PER DUPLEX  05-19-2012   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/26/2017   Right calf   Urinary frequency    Vitamin D deficiency    Past Surgical History:  Procedure Laterality Date   APPENDECTOMY  AS CHILD   CARDIOVASCULAR STRESS TEST  03-27-2012  DR CRENSHAW   LOW RISK LEXISCAN STUDY-- PROBABLE NORMAL PERFUSION AND SOFT TISSUE ATTENUATION/  NO ISCHEMIA/ EF 51%   CAROTID ENDARTERECTOMY Bilateral LEFT  11-12-2008  DR GREG HAYES   RIGHT ICA  2006  ([facility])   CHOLECYSTECTOMY N/A 02/23/2022   Procedure: LAPAROSCOPIC  CHOLECYSTECTOMY;  Surgeon: Quentin Ore, MD;  Location: [facility] OR;  Service: General;  Laterality: N/A;   CYSTOSCOPY W/ RETROGRADES Bilateral 06/22/2021   Procedure: CYSTOSCOPY WITH RETROGRADE PYELOGRAM;  Surgeon: Marcine Matar, MD;  Location: [facility];  Service: Urology;  Laterality: Bilateral;   CYSTOSCOPY WITH LITHOLAPAXY N/A 02/26/2013   Procedure: CYSTOSCOPY WITH LITHOLAPAXY;  Surgeon: Marcine Matar, MD;  Location: [facility];  Service: Urology;  Laterality: N/A;   ENDOSCOPIC RETROGRADE CHOLANGIOPANCREATOGRAPHY (ERCP) WITH PROPOFOL N/A 02/22/2022   Procedure: ENDOSCOPIC RETROGRADE CHOLANGIOPANCREATOGRAPHY (ERCP) WITH PROPOFOL;  Surgeon: Jeani Hawking, MD;  Location: [facility] ENDOSCOPY;  Service: Gastroenterology;  Laterality: N/A;   EYE SURGERY  Jan. 2016   cataract surgery both eyes   INGUINAL HERNIA REPAIR Right 11-08-2006   IR KYPHO EA ADDL LEVEL THORACIC OR LUMBAR  02/12/2021   IR RADIOLOGIST EVAL & MGMT  02/18/2021   MASS EXCISION N/A 03/03/2016   Procedure: EXCISION OF BACK  MASS;  Surgeon: Almond Lint, MD;  Location: [facility];  Service: General;  Laterality: N/A;   MOHS SURGERY Left 1/ 2016   Dr Margo Aye-- Basal cell   PROSTATE SURGERY     REMOVAL OF STONES  02/22/2022   Procedure: REMOVAL OF STONES;  Surgeon: Jeani Hawking, MD;  Location: [facility] ENDOSCOPY;  Service: Gastroenterology;;   Dennison Mascot  02/22/2022   Procedure: Dennison Mascot;  Surgeon: Jeani Hawking, MD;  Location: [facility] ENDOSCOPY;  Service: Gastroenterology;;   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-C N/A 06/22/2021   Procedure: TRANSURETHRAL RESECTION OF BLADDER TUMOR;  Surgeon: Marcine Matar, MD;  Location: [facility];  Service: Urology;  Laterality: N/A;   TRANSURETHRAL RESECTION OF PROSTATE N/A 02/26/2013   Procedure: TRANSURETHRAL RESECTION OF THE PROSTATE WITH GYRUS INSTRUMENTS;  Surgeon: Marcine Matar, MD;  Location: [facility];  Service: Urology;  Laterality: N/A;   TRANSURETHRAL RESECTION OF PROSTATE N/A 06/22/2021   Procedure: TRANSURETHRAL RESECTION OF THE PROSTATE (TURP);  Surgeon: Marcine Matar, MD;  Location: [facility];  Service: Urology;  Laterality: N/A;   Patient Active Problem List   Diagnosis Date Noted   Dysuria 08/03/2022   Nonintractable headache 07/01/2022   Bilateral impacted cerumen 06/24/2022   Rash 06/15/2022   Postoperative ileus 02/27/2022   Ileus, postoperative 02/26/2022   Choledocholithiasis 02/19/2022   DNR (do not resuscitate) 02/19/2022   Left elbow pain 01/26/2022   Mid back pain on left side 01/26/2022   Rib pain 01/26/2022   Acute pain of left shoulder 11/12/2021   Leukocytes in urine 11/12/2021   Urinary frequency 11/12/2021   Thrush 10/08/2021   Hemiplegia, dominant side S/P CVA (cerebrovascular accident) 09/11/2021   Insomnia    Prediabetes    Acute 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    Basal ganglia infarction 07/29/2021   Transaminitis 07/27/2021   UTI (urinary tract infection) 07/27/2021   CVA (cerebral vascular accident) 07/27/2021   Fall 07/27/2021   Hyperglycemia 07/27/2021   Cholelithiasis 07/27/2021   Hypoxia 07/27/2021   Nausea and vomiting 07/27/2021   Acute metabolic encephalopathy 07/27/2021   Normocytic anemia 07/27/2021   Chronic back pain 07/27/2021   Malignant neoplasm of overlapping sites of bladder 06/22/2021   Closed fracture of first lumbar vertebra with routine healing 02/03/2021   Closed fracture of multiple ribs 11/18/2020   Anxiety 01/29/2020   Leg pain, bilateral 01/29/2020   Ingrown toenail 07/13/2019   Lumbar spondylosis 05/02/2018   Pain in left knee 03/09/2018   Osteoarthritis of left hip 01/16/2018   Trochanteric bursitis of left hip 01/16/2018   Preventative health care 09/26/2017   HTN (hypertension) 07/19/2015   Hyperlipidemia 07/19/2015   Great toe pain  02/11/2014   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   Obesity (BMI 30-39.9) 06/25/2013   Renal insufficiency 06/25/2013   Weakness of left arm 06/25/2013   Sebaceous cyst 03/03/2011   Sprain of lumbar region 07/31/2010   Rib pain, left 08/29/2009   Carotid artery stenosis, asymptomatic, bilateral 05/02/2009   Eczema, atopic 05/31/2008   Vitamin D deficiency 03/01/2008   BPH (benign prostatic hyperplasia) 08/06/2007   Fasting hyperglycemia 12/21/2006   History of right MCA infarct 2006    ONSET DATE: Acute exacerbation of chronic symptoms (most recent hospitalization 02/26/22 - 03/12/22, followed by SNF and home health)   REFERRING DIAG:  I63.9 (ICD-10-CM) - Cerebrovascular accident (CVA), unspecified mechanism (HCC)  W19.XXXD (ICD-10-CM) - Fall, subsequent encounter    THERAPY DIAG:  Muscle weakness (generalized)  Hemiplegia and hemiparesis following cerebral infarction affecting left non-dominant side  Attention and concentration deficit  Other lack of coordination  Other symptoms and signs involving cognitive functions following cerebral infarction  Acute pain of left  shoulder  Other abnormalities of gait and mobility  Rationale for Evaluation and Treatment: Rehabilitation  PERTINENT HISTORY: PMHx includes L basal ganglia and corona radiata infarct 07/27/21, L lower homonymous quadrantanopia; R MCA infarct in 2006 w/ L-sided hemiparesis, NCD, carotid artery disease (bilateral), HTN, HLD,CKD3, vascular dementia, and hx of bladder cancer. He has had falls in 2023, fracturing his clavicle in August, then he had stomach surgery in Sept 2023. He has He needs assist at baseline for ADLs. When last documented in acute rehab after having his gall-bladder removed, (03/09/22) he required moderate to maximum assist x2 for most ADLs (more assist for ADLs the require standing), and they recommended rehab in skilled nursing setting. He apparently got Covid at some point and also  attended a SNF. Once he D/C home, he had some episodes of combativeness at night  with "sun-downing."  He is communicative and appropriate, though appears easily confused and distracted, not fully oriented to time and place (Ax2, states date is 19 and cannot say what city he lives in). His wife provides most details, and he is participative, though a poor historian at times. In a nut shell- he has been suffering from dementia and post-stroke effects for years, and his issues were recently exacerbated in Sept of last year after abdominal surgery. He has less use of Lt "stroke side" and new, ritualistic "ticks" in mouth and Rt hand/arm.  Wife states that they do have a caregiver come M, W, F for 4 hours in the middle of the day to mainly help him bathe and dress, which usually tires him out. He has been more combative towards the end of the day as well.   PRECAUTIONS: Fall and Other: Lt hemi, poor cognition, LATEX and adhesive allergies, etc.  ;WEIGHT BEARING RESTRICTIONS: No   SUBJECTIVE:   SUBJECTIVE STATEMENT: Pt reports neck pain  Pt accompanied by:  his wife   PAIN:  Are you having pain? Yes: NPRS scale: 3-4/10 Pain location: L shoulder and neck Pain description: aching Aggravating factors: malpositioning, use Relieving factors: heat     FALLS: Has patient fallen in last 6 months? Yes. Number of falls at least 1 resulting in clavicle fx (now healed)   LIVING ENVIRONMENT: Lives with: lives with their spouse Lives in: House/apartment Has following equipment at home: manual w/c, 2 w/w, weighted utensils, shower bench (for tub), bed cane, hospital bed, raised toilet, and possibly more  PLOF: Needs assistance with ADLs, Needs assistance with homemaking, Needs assistance with gait, and Needs assistance with transfers  PATIENT GOALS: at eval- increase personal hygiene skills (brush teeth, shaving)  prepare instant coffee or microwave meal improve functional cognitive skills to play  games, etc.,   increase Lt arm usage try to help decrease Rt hand/arm perseveration (flicking, tapping, noise making) Improve fnl tranfers in/out of multiple seats (tub bench, swivel chair, etc.)    OBJECTIVE: (All objective assessments below are from initial evaluation on: 07/07/22 unless otherwise specified.)   HAND DOMINANCE: Right  ADLs: Overall ADLs: Wife states decreased ability since hospitalization in Sept 23.  Transfers/ambulation related to ADLs: Min A transfer, CGA for mobility Eating: set up Grooming: Mod A- needs assist with oral care, shaving, but can comb his hair UB Dressing: Min A  LB Dressing: Dep  Toileting:  Mod  Bathing: Mod  Tub Shower transfers: Min A  Equipment: Emergency planning/management officer, Grab bars, and Feeding equipment  IADLs: completely dependent for years now, except some ability to use microwave which is  now not possible after hospitalization   MOBILITY STATUS: Needs Assist: CGA - Min A, Hx of falls, Festination, difficulty with turns, and neglect on Lt side  POSTURE COMMENTS:  rounded shoulders, forward head, and weight shift left  ACTIVITY TOLERANCE: Activity tolerance: he became tired and somewhat agitated by the end of the OT evaluation today (after PT and OT about 2.5hours with mostly sedate activities in seated positions)  FUNCTIONAL OUTCOME MEASURES: [facility] 17/30  showing problems with visuospatial/executive function, attention, fluency, and orientation. (at least 26/30 is needed to be considered "normal")  [employer] in ADLs: 1/6  (very dependent)  UPPER EXTREMITY ROM:    Active ROM Right eval Left eval  Shoulder scaption 100 85  Shoulder abduction    Shoulder adduction    Shoulder extension    Shoulder internal rotation Reaches small of back Reaches to hip  Shoulder external rotation Reaches back of head  Reaches up to ear  Elbow flexion 150 130  Elbow extension (-10*)  (-35*)  Wrist flexion 35 20  Wrist extension 50 10  (Blank  rows = not tested)  UPPER EXTREMITY MMT:    Tested grossly as "push" and "pull": UE push 4-/5, UE pull 4/5  HAND FUNCTION: Grip strength: Right: 30 lbs; Left: 20 lbs  COORDINATION: Finger Nose Finger test: mildly ataxic but fairly accurate  Box and Blocks:  Right 11blocks, Left 5blocks  MUSCLE TONE:  Rt- normal, Lt- decreased and worsened by inattention/disuse  COGNITION: Overall cognitive status: Impaired and Difficulty to assess due to: severity of deficits; Poor attention, fluency, orientation, and executive function. Recall is fairly good as well as abstraction.  Seems to have personality changes due to vascular dementia- like an OCD type volitional perseveration of tapping with Rt hand that he can somewhat control.  He did become angry and agitated at the end of the session performing car transfer with spouse: he did shout at her.  VISION: Subjective report: no new changes per pt/spouse, uses "cheaters"   VISION ASSESSMENT: Visual fields seem intact though he had latent responses on the left side showing likely inattention/and/neglect, tracking somewhat delayed horizontally and vertically  PERCEPTION: Impaired: Inattention/neglect: does not attend to left visual field and does not attend to left side of body, Body scheme: poor quality of recognition with shaving, etc., and Spatial orientation: decreased in print and in orientation, especially to Lt side  PRAXIS: Not tested specifically but no overt/stated issues using pen, wearing glasses ,etc.   OBSERVATIONS: Pt taps and flicks and does seem to volitionally perseverate on making a rhythm/noise with Rt hand. He states he is aware of it and chooses to do it.  He does also seem to have mild resting and intention tremor.    TODAY'S TREATMENT:     DATE:10/07/22 Pt reports neck and shoulder pain today. It appears related to posture and left shoulder hike. Supine hotpack applied to neck x approximately 10 mins while therapist performed  gentle stretch to shoulders in retraction and depression followed by pt performing closed chain shoulder flexion and chest press, mod facilitation Seated trunk and head rotation tap wall with pole on left side for left neck rotation. Verbal cues for upright posture, mirror and visual target uses. Self stretch closed chain reaching for floor and chest press, mod v.c and min-mod facilitation Placing pegs into pegboard with RUE then removing with LUE, min v.c  10/05/22- Pt reports neck and shoulder pain today. It appears related to posture and left shoulder hike. Supine  hotpack applied to neck x approximately 10 mins while pt performed closed chain shoulder flexion and chest press. Mod facilitation. Functional reaching with left and right UE's to retrieve and replace beanbags. Standing with hands on fitter poles pt rocked forwards and backwards with shoulder flexion then retraction mod v.c and min facilitation. Attempted weight shifts side to side, max v.c and target for weightshifts. Seated tapping boomwhakers to target , mod v.c  for shoulder flexion and functional reach to targets. Pt practiced opening coffee container several times, accurately, in standing and seated pt's wife reports pt sometimes has difficulty at home. Opening the newspaper with min -mod v.c for LUE functional use.   09/30/22- seated on mat functional reaching with right and left UE's to grasp and release cones to target with trunk rotation followed by standing to place cones in overhead cabinet with left and right UE's min v.c and facilitaiton for LUE use. Seated playing connect 4 using left and right UE's to place checkers in frame, mod v.c for LUE use, pt won the game.  Placing large pegs into pegboard in seated with RUE with cognitve component, then removing with LUE, min-mod v.c for LUE use. Arm bike x 4 mins level 1 for conditioning.   09/28/22 Pt's wife brought in pt's electric toothbrush, tooothpaste and cup.Pt stood at the sink  with supervision to apply toothpaste to brush, min facilitation/ v.c for using LUE to stabilize toothbrush, pt required min A , and min v.c intially to open toothpaste. Therapist marked toothpaste for verbal cue and pt opened a second time with increased success. Pt applied toothpaste to brush and turned brush on to begin brushing teeth. Pt performed the task with mod v.c and min facilitation/ hand over hand assist to slow down brushing speed. Pt was also cued to brush left side and cued to pause when he was noted to be brushing too hard. Pt sat on the mat using a small portable mirror to shave with electric razor. Pt required mod A for task for thoroughness. Pt was cued to shave specific areas, and therapist facilitated pt's hand positioning so that razor was flatter on face. Therapist assisted pt with shaving mustache area and pt was noted to have increased difficulty this this portion. Seated at table card matching task to match by number or suit, min v.c and mod facilitation for LUE use.  09/23/22- doffing jacket seated on mat, mod A, v.c Standing at countertop Pt simulated opening OJ container and closing with supervision.  Dynamic functional step and reach to targets on left and right sides, supervision and min v.c Functional reaching inially in standing to remove wooden pegs from pegboard with LUE, mod v.c, due to fatigue pt transitioned to seated.   Replacing pegs into pegboard with trunk rotation and min-mod v.c, min facilitation for LUE use. Closed chain shoulder flexion with ball, mod failitation/ v.c.  Donning jacket with min A and mod v.c  PATIENT EDUCATION: Education details: See treatment section above for details,  Person educated: Patient and Spouse Education method: Explanation, Demonstration, Tactile cues, and Verbal cues Education comprehension: verbalized understanding, returned demonstration, verbal cues required, tactile cues required, and needs further education  HOME  EXERCISE PROGRAM: Access Code: [email or web address] URL: [email or web address] Date: 07/21/2022 Prepared by: Fannie Knee Exercises - Seated Shoulder Flexion AAROM with Dowel  - 4-6 x daily - 1 sets - 10-15 reps - Seated Shoulder Abduction AAROM with Dowel  - 4-6 x daily - 1 sets - 10-15 reps - Seated  Shoulder External Rotation AAROM with Dowel  - 4-6 x daily - 1 sets - 10-15 reps - Seated Bicep Curls with Bar  - 4-6 x daily - 1 sets - 10-15 reps - Seated Shoulder Row with Anchored Resistance  - 4-6 x daily - 1 sets - 10-15 reps     GOALS: Goals reviewed with patient? Yes   SHORT TERM GOALS: (STG required if POC>30 days) Target Date: 09/23/22   Pt will demo/state understanding of initial HEP (or home activity suggestions to maintain function as appropriate) to improve functional skills. Goal status: 07/28/22- Met (addressed in past 2 sessions) 2. Pt will donn a t-shirt consistently with min A and v.c   Goal status: met , per wife's report 3. Pt will demonstrate ability to open the newspaper  with supervision min v.c Goal status:  ongoing, inconsistent, min , mod v.c at times 10/05/22 4.  Pt will consistently sustain focus to a simple functional task  in a minimally distracting environment x 6 mins with no more than 1 re-direct(ie: eating, functional task in clinic)  Goal status:  improved but inconsistent  however pt has been able to sutain attention to several functional tasks in clinic  x 6 mins several times recently 10/05/22  5. Pt will consistently open containers with only supervision and min v.c           Goal status: partially met,  opens most containers at this level  in the clinic, however sometimes has difficulty with coffee containers at home sometimes, per wife's report. 10/05/22    LONG TERM GOALS: Target Date: 11/16/22  Pt will improve functional ability by decreased impairment per Trinna Balloon assessment from 1/6 to 2/6 or better, for better quality of  life. Goal status:  not met 1/6  2.  Caregiver will state his grooming ability/hygiene ability improved from moderate assist to supervision assist. Goal status: not met-Pt continues to require mod A.  3.  He will show better functional use of left arm by improved box and blocks Test score from 5 blocks to at least 10 blocks. Goal status:not met but improved 8 blocks  4.  Pt will show ability to microwave instant coffee or small meal safely, to help decrease caregiver burden. Goal status: partially met, pt is able to microwave coffee with supervision, however occasional min A to open containers 5.  He will show improved cognitive ability enough to play a simple game with OT and caregiver. Goal status: approximating goal, pt has played a game of uno at home with family.  6.  Caregiver will state at least 4 ways to decrease perseveration with right arm, also stating that this behaviors at least somewhat better in the home now. Goal status: deferred perseverative behaviors have improved.  7. Pt will donn a flannel button front shirt consistently with min A and min v.c Baseline: mod-max Goal status:  ongoing, min-mod A, min-mod v.c 09/15/22 8.  Pt will use his LUE as an active assist for ADLS grossly 40% of the time with min v.c Baseline: uses 25% Goal status:  ongoing 09/15/22  9 Pt will stand at the countertop and perform functional activities  incorporating bilateral UE's with supervision and min v.c  Goal status:  ongoing, min assist at time-09/15/22 10. Pt will back up to a chair and sit down squarely at least 1/3 of the time with min v.c  Goal status: ongoing , performs in clinic with min-mod v.c 09/15/22 11..  Pt will open  the newspaper and locate sports section with no more than min v.c                          Goal status:  ongoing, min-mod A at times 09/15/22  ASSESSMENT:  CLINICAL IMPRESSION:Pt was limited by neck pain today. Pt demonstrates left shoulder hike and requires v.c for  positioning  OT Frequency/DURATION: 2x week x 12 weeks however anticipate d/c after 8 weeks dependent on progress  PLANNED INTERVENTIONS: self care/ADL training, therapeutic exercise, therapeutic activity, neuromuscular re-education, manual therapy, balance training, functional mobility training, moist heat, cryotherapy, patient/family education, cognitive remediation/compensation, visual/perceptual remediation/compensation, psychosocial skills training, energy conservation, coping strategies training, DME and/or AE instructions, and Re-evaluation  RECOMMENDED OTHER SERVICES: He has already seen PT for balance and functional mobility, he would probably benefit from speech for cognitive issues as well, though OT will be working with cognition somewhat functionally now.  CONSULTED AND AGREED WITH PLAN OF CARE: Patient and family member/caregiver  PLAN FOR NEXT SESSION:    Work towards goals,cognition,  monitor neck pain,continue to address ADL tasks prn,  Functional reaching, functional use of bilateral UE's in standing,    Ellyce Lafevers, OTR/L 3:50 PM 10/07/22

## 2022-10-08 ENCOUNTER — Telehealth (INDEPENDENT_AMBULATORY_CARE_PROVIDER_SITE_OTHER): Payer: PPO | Admitting: Family Medicine

## 2022-10-08 ENCOUNTER — Encounter: Payer: Self-pay | Admitting: Family Medicine

## 2022-10-08 VITALS — Ht 71.0 in | Wt 170.0 lb

## 2022-10-08 DIAGNOSIS — F0390 Unspecified dementia without behavioral disturbance: Secondary | ICD-10-CM | POA: Diagnosis not present

## 2022-10-08 DIAGNOSIS — I63511 Cerebral infarction due to unspecified occlusion or stenosis of right middle cerebral artery: Secondary | ICD-10-CM

## 2022-10-08 DIAGNOSIS — Z8673 Personal history of transient ischemic attack (TIA), and cerebral infarction without residual deficits: Secondary | ICD-10-CM

## 2022-10-08 NOTE — Assessment & Plan Note (Signed)
Fmla filled out

## 2022-10-08 NOTE — Assessment & Plan Note (Signed)
Fmla paperwork filled out

## 2022-10-08 NOTE — Telephone Encounter (Signed)
Forms completed and emailed to daughter. Copy placed to scan and original placed at the front for pickup

## 2022-10-08 NOTE — Progress Notes (Addendum)
MyChart Video Visit    Virtual Visit via Video Note   This visit type was conducted due to national recommendations for restrictions regarding the COVID-19 Pandemic (e.g. social distancing) in an effort to limit this patient's exposure and mitigate transmission in our community. This patient is at least at moderate risk for complications without adequate follow up. This format is felt to be most appropriate for this patient at this time. Physical exam was limited by quality of the video and audio technology used for the visit. William [name] was able to get the patient set up on a video visit.  Patient location: Home Patient and provider in visit Provider location: Office  I discussed the limitations of evaluation and management by telemedicine and the availability of in person appointments. The patient expressed understanding and agreed to proceed.  Visit Date: 10/08/2022  Today's healthcare provider: Donato Schultz, DO     Subjective:    Patient ID: William [name], male    DOB: [birth year], 82 y.o.   MRN: [id number]  Chief Complaint  Patient presents with   FMLA paperwork    For daughter who lives out of state- intermittent 2-8 weeks over the next 1 year     HPI Patient is in today for fmla paperwork.  The pt daughter needs it filled out so she can leave work to take her father to drs appointments and help with ADLS.  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   Mild, related to stroke history   Nocturia    Renal insufficiency 06/25/2013   S/P carotid endarterectomy    BILATERAL ICA--  PATENT PER DUPLEX  05-19-2012   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   Right calf   Urinary frequency    Vitamin D deficiency     Past Surgical History:  Procedure Laterality Date   APPENDECTOMY  AS CHILD   CARDIOVASCULAR STRESS TEST  03-27-2012  DR CRENSHAW   LOW RISK LEXISCAN STUDY-- PROBABLE NORMAL PERFUSION AND SOFT TISSUE ATTENUATION/  NO ISCHEMIA/ EF 51%   CAROTID ENDARTERECTOMY Bilateral LEFT  11-12-2008  DR GREG HAYES   RIGHT ICA  2006  ([facility])   CHOLECYSTECTOMY N/A 02/23/2022   Procedure: LAPAROSCOPIC CHOLECYSTECTOMY;  Surgeon: Quentin Ore, MD;  Location: [facility] OR;  Service: General;  Laterality: N/A;   CYSTOSCOPY W/ RETROGRADES Bilateral 06/22/2021   Procedure: CYSTOSCOPY WITH RETROGRADE PYELOGRAM;  Surgeon: Marcine Matar, MD;  Location: [facility];  Service: Urology;  Laterality: Bilateral;   CYSTOSCOPY WITH LITHOLAPAXY N/A 02/26/2013   Procedure: CYSTOSCOPY WITH LITHOLAPAXY;  Surgeon: Marcine Matar, MD;  Location: [facility];  Service: Urology;  Laterality: N/A;   ENDOSCOPIC RETROGRADE CHOLANGIOPANCREATOGRAPHY (ERCP) WITH PROPOFOL N/A 02/22/2022   Procedure: ENDOSCOPIC RETROGRADE CHOLANGIOPANCREATOGRAPHY (ERCP) WITH PROPOFOL;  Surgeon: Jeani Hawking, MD;  Location:  [facility] END[facility];  Service: Gastroenterology;  Laterality: N/A;   EYE SURGERY  Jan. 2016   cataract surgery both eyes   INGUINAL HERNIA REPAIR Right 11-08-2006   IR KYPHO EA ADDL LEVEL THORACIC OR LUMBAR  02/12/2021   IR RADIOLOGIST EVAL & MGMT  02/18/2021   MASS EXCISION N/A 03/03/2016   Procedure: EXCISION OF BACK  MASS;  Surgeon: Almond Lint, MD;  Location: [facility];  Service: General;  Laterality: N/A;   MOHS SURGERY Left 1/ 2016   Dr Margo Aye-- Basal cell   PROSTATE  SURGERY     REMOVAL OF STONES  02/22/2022   Procedure: REMOVAL OF STONES;  Surgeon: Jeani Hawking, MD;  Location: [facility] ENDOSCOPY;  Service: Gastroenterology;;   Dennison Mascot  02/22/2022   Procedure: Dennison Mascot;  Surgeon: Jeani Hawking, MD;  Location: [facility];  Service: Gastroenterology;;   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-C N/A 06/22/2021   Procedure: TRANSURETHRAL RESECTION OF BLADDER TUMOR;  Surgeon: Marcine Matar, MD;  Location: [facility];  Service: Urology;  Laterality: N/A;   TRANSURETHRAL RESECTION OF PROSTATE N/A 02/26/2013   Procedure: TRANSURETHRAL RESECTION OF THE PROSTATE WITH GYRUS INSTRUMENTS;  Surgeon: Marcine Matar, MD;  Location: [facility];  Service: Urology;  Laterality: N/A;   TRANSURETHRAL RESECTION OF PROSTATE N/A 06/22/2021   Procedure: TRANSURETHRAL RESECTION OF THE PROSTATE (TURP);  Surgeon: Marcine Matar, MD;  Location[facility];  Service: Urology;  Laterality: N/A;    Family History  Problem Relation Age of Onset   Heart disease Mother        CHF   Bipolar disorder Mother    Heart disease Father        CHF    Social History   Socioeconomic History   Marital status: Married    Spouse name: Not on file   Number of children: 2   Years of education: 12   Highest education level: High school graduate  Occupational History    Employer: Retired  Tobacco Use   Smoking status: Former    Packs/day: 2.00    Years: 40.00    Additional pack years: 0.00    Total pack years: 80.00    Types: Cigarettes    Quit date: 02/15/2005    Years since quitting: 17.6   Smokeless tobacco: Never  Vaping Use   Vaping Use: Never used  Substance and Sexual Activity   Alcohol use: Not Currently    Comment: Occasional   Drug use: No   Sexual activity: Not Currently    Partners: Female  Other Topics Concern   Not on file  Social History Narrative   Exercise--  Walks with assistance --  using wheelchair more than the walker       LIves with wife , no stairs in home, caffeine - one cup coffee day, exercise - not much, Right handed, 12th grade, retired      One story home   Social Determinants of Health   Financial Resource Strain: Not on file  Food Insecurity: No Food Insecurity (03/02/2022)   Hunger Vital Sign    Worried About Running Out of Food in the Last Year: Never true    Ran Out of Food in the Last Year: Never true  Transportation Needs: No Transportation Needs (03/02/2022)   PRAPARE - Administrator, Civil Service (Medical): No    Lack of Transportation (Non-Medical): No  Physical Activity: Not on file  Stress: Not on file  Social Connections: Not on file  Intimate Partner Violence: Not At Risk (03/02/2022)   Humiliation, Afraid, Rape, and Kick questionnaire    Fear of Current or Ex-Partner: No    Emotionally Abused: No    Physically Abused: No    Sexually Abused: No    Outpatient Medications Prior to Visit  Medication Sig Dispense Refill   acetaminophen (TYLENOL) 325 MG tablet Take 2 tablets (650 mg total) by mouth every 6 (six) hours as needed for mild pain.     amantadine (SYMMETREL) 100 MG capsule Take 1 capsule (100 mg total) by mouth daily. 90 capsule 1   amLODipine (NORVASC) 10 MG tablet Take 1 tablet (10 mg total) by mouth every morning. 90 tablet 1   carBAMazepine (TEGRETOL) 100 MG/5ML suspension Take 5 mLs (100 mg total) by mouth in the morning and in the afternoon, and then 10 mLs (200 mg total) at bedtime. 600 mL 3   Cholecalciferol (VITAMIN D3) 25 MCG (1000 UT) CHEW Chew 1 tablet by mouth daily.     clobetasol cream (TEMOVATE) 0.05 % Apply 1 Application topically 2 (two) times daily as needed. (Not to the face,groin or axilla.) 60 g 3   clopidogrel (PLAVIX) 75 MG tablet Take 1 tablet (75 mg total) by mouth daily. 90 tablet 1   clotrimazole-betamethasone (LOTRISONE) cream Apply 1 Application on to the skin daily. 30 g 0   ferrous  sulfate 325 (65 FE) MG EC tablet Take 325 mg by mouth 2 (two) times daily. *Crushed*     fluticasone (CUTIVATE) 0.05 % cream 1 application  daily as needed (psoriasis).  3   hydrALAZINE (APRESOLINE) 25 MG tablet Take 1 tablet (25 mg total) by mouth every 8 (eight) hours. 270 tablet 1   LORazepam (ATIVAN) 0.5 MG tablet Take 1/2 tablet (0.25 mg total) by mouth in the morning and take 1 tablet (0.5 mg total) in the afternoon at 4pm. 50 tablet 4   lubiprostone (AMITIZA) 24 MCG capsule Take 1 capsule (24 mcg total) by mouth daily with breakfast. 30 capsule 2   Multiple Vitamins-Minerals (ADULT ONE DAILY GUMMIES) CHEW Chew 1 tablet by mouth every morning.     pantoprazole (PROTONIX) 40 MG tablet Take 1 tablet (40 mg total) by mouth daily. (Patient taking differently: Take 40 mg by mouth daily. *Crushed*) 90 tablet 3   polyethylene glycol (MIRALAX / GLYCOLAX) 17 g packet Take 17 g by mouth daily. 14 each 0   tiZANidine (ZANAFLEX) 4 MG tablet Take 1 tablet (4 mg total) by mouth every 6 (six) hours as needed for muscle spasms. 30 tablet 0   traZODone (DESYREL) 150 MG tablet Take 0.5 tablets (75 mg total) by mouth at bedtime. *Crushed* 20 tablet 1   triamcinolone cream (KENALOG) 0.1 % Apply 1 application topically 2 (two) times daily. (Patient taking differently: Apply 1 Application topically 2 (two) times daily as needed (rash).) 30 g 0   LORazepam ER (LOREEV XR) 1 MG CS24 Take 1 mg by mouth at bedtime. *Crushed* (Patient taking differently: Take 1 mg by mouth at bedtime. *Crushed*) 20 capsule 0   tamsulosin (FLOMAX) 0.4 MG CAPS capsule Take 1 capsule (0.4 mg total) by mouth daily after supper. (Patient taking differently: Take 0.4 mg by mouth daily after supper. *Crushed*) 90 capsule 1   cephALEXin (KEFLEX) 500 MG capsule Take 1 capsule (500 mg total) by mouth 2 (two) times daily. (Patient not taking: Reported on 10/08/2022) 20 capsule 0   LORazepam (ATIVAN) 0.5 MG tablet  Take 0.5 tablets (0.25 mg total) by  mouth daily. *Crushed* 10 tablet 0   LORazepam ER (LOREEV XR) 1 MG CS24 Take 1 capsule (1 mg total) by mouth at bedtime as directed. 30 capsule 4   LORazepam ER (LOREEV XR) 1 MG CS24 Take 1 mg by mouth at bedtime. *Crushed* 30 capsule 3   Facility-Administered Medications Prior to Visit  Medication Dose Route Frequency Provider Last Rate Last Admin   gemcitabine (GEMZAR) chemo syringe for bladder instillation 2,000 mg  2,000 mg Bladder Instillation Once Marcine Matar, MD        Allergies  Allergen Reactions   Bee Venom Anaphylaxis   Strawberry Extract Hives   Latex Itching   Zetia [Ezetimibe] Other (See Comments)    Intolerance    Adhesive [Tape] Other (See Comments)    blisters   Statins Other (See Comments)    myalgias    Review of Systems  Constitutional:  Negative for fever and malaise/fatigue.  HENT:  Negative for congestion.   Eyes:  Negative for blurred vision.  Respiratory:  Negative for cough and shortness of breath.   Cardiovascular:  Negative for chest pain, palpitations and leg swelling.  Gastrointestinal:  Negative for vomiting.  Musculoskeletal:  Negative for back pain.  Skin:  Negative for rash.  Neurological:  Negative for loss of consciousness and headaches.       Objective:    Physical Exam Vitals and nursing note reviewed.  Constitutional:      Appearance: He is well-developed.  Neck:     Thyroid: No thyromegaly.  Pulmonary:     Effort: Pulmonary effort is normal.  Musculoskeletal:     Right hip: Tenderness present. Normal range of motion. Normal strength.     Left hip: Tenderness present. Normal range of motion. Normal strength.     Right foot: Bony tenderness present. No swelling.     Left foot: Bony tenderness present. No swelling.  Neurological:     Mental Status: He is alert and oriented to person, place, and time.  Psychiatric:        Mood and Affect: Mood normal.        Behavior: Behavior normal.     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 The patient was provided an opportunity to ask questions and all were answered. The patient agreed with the plan and demonstrated an understanding of the instructions.   The patient was advised to call back or seek an in-person evaluation if the symptoms worsen or if the condition fails to improve as anticipated.  Donato Schultz, DO [facility] [phone] (phone) [phone] (fax)  [facility]

## 2022-10-11 ENCOUNTER — Ambulatory Visit: Payer: PPO | Admitting: Physical Therapy

## 2022-10-11 ENCOUNTER — Other Ambulatory Visit (HOSPITAL_BASED_OUTPATIENT_CLINIC_OR_DEPARTMENT_OTHER): Payer: Self-pay

## 2022-10-11 ENCOUNTER — Other Ambulatory Visit (HOSPITAL_COMMUNITY): Payer: Self-pay | Admitting: Psychiatry

## 2022-10-11 ENCOUNTER — Encounter: Payer: Self-pay | Admitting: Physical Therapy

## 2022-10-11 DIAGNOSIS — M25512 Pain in left shoulder: Secondary | ICD-10-CM

## 2022-10-11 DIAGNOSIS — M6281 Muscle weakness (generalized): Secondary | ICD-10-CM

## 2022-10-11 DIAGNOSIS — R262 Difficulty in walking, not elsewhere classified: Secondary | ICD-10-CM

## 2022-10-11 DIAGNOSIS — I69354 Hemiplegia and hemiparesis following cerebral infarction affecting left non-dominant side: Secondary | ICD-10-CM

## 2022-10-11 DIAGNOSIS — R278 Other lack of coordination: Secondary | ICD-10-CM

## 2022-10-11 DIAGNOSIS — R296 Repeated falls: Secondary | ICD-10-CM

## 2022-10-11 NOTE — Therapy (Signed)
OUTPATIENT PHYSICAL THERAPY NEURO Treatment   Patient Name: William [name] MRN: [id number] DOB:[birth year], 82 y.o., male Today's Date: 10/11/2022  PCP: Donato Schultz  DO REFERRING PROVIDER: same  END OF SESSION:  PT End of Session - 10/11/22 1539     Visit Number 27    Date for PT Re-Evaluation 10/30/22    PT Start Time 1526    PT Stop Time 1612    PT Time Calculation (min) 46 min    Equipment Utilized During Treatment Gait belt    Activity Tolerance Patient tolerated treatment well    Behavior During Therapy WFL for tasks assessed/performed                 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   HTN (hypertension) 07/19/2015   Hyperlipidemia    Major neurocognitive disorder 01/09/2014   Mild, related to stroke history   Nocturia    Renal insufficiency 06/25/2013   S/P carotid endarterectomy    BILATERAL ICA--  PATENT PER DUPLEX  05-19-2012   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   Right calf   Urinary frequency    Vitamin D deficiency    Past Surgical History:  Procedure Laterality Date   APPENDECTOMY  AS CHILD   CARDIOVASCULAR STRESS TEST  03-27-2012  DR CRENSHAW   LOW RISK LEXISCAN STUDY-- PROBABLE NORMAL PERFUSION AND SOFT TISSUE ATTENUATION/  NO ISCHEMIA/ EF 51%   CAROTID ENDARTERECTOMY Bilateral LEFT  11-12-2008  DR GREG HAYES   RIGHT ICA  2006  ([facility])   CHOLECYSTECTOMY N/A 02/23/2022   Procedure: LAPAROSCOPIC CHOLECYSTECTOMY;  Surgeon: Quentin Ore, MD;  Location: [facility];  Service: General;  Laterality: N/A;    CYSTOSCOPY W/ RETROGRADES Bilateral 06/22/2021   Procedure: CYSTOSCOPY WITH RETROGRADE PYELOGRAM;  Surgeon: Marcine Matar, MD;  Location: [facility];  Service: Urology;  Laterality: Bilateral;   CYSTOSCOPY WITH LITHOLAPAXY N/A 02/26/2013   Procedure: CYSTOSCOPY WITH LITHOLAPAXY;  Surgeon: Marcine Matar, MD;  Location: [facility];  Service: Urology;  Laterality: N/A;   ENDOSCOPIC RETROGRADE CHOLANGIOPANCREATOGRAPHY (ERCP) WITH PROPOFOL N/A 02/22/2022   Procedure: ENDOSCOPIC RETROGRADE CHOLANGIOPANCREATOGRAPHY (ERCP) WITH PROPOFOL;  Surgeon: Jeani Hawking, MD;  Location: [facility] ENDOSCOPY;  Service: Gastroenterology;  Laterality: N/A;   EYE SURGERY  Jan. 2016   cataract surgery both eyes   INGUINAL HERNIA REPAIR Right 11-08-2006   IR KYPHO EA ADDL LEVEL THORACIC OR LUMBAR  02/12/2021   IR RADIOLOGIST EVAL & MGMT  02/18/2021   MASS EXCISION N/A 03/03/2016   Procedure: EXCISION OF BACK  MASS;  Surgeon: Almond Lint, MD;  Location: [facility];  Service: General;  Laterality: N/A;   MOHS SURGERY Left 1/ 2016   Dr Margo Aye-- Basal cell   PROSTATE SURGERY     REMOVAL OF STONES  02/22/2022   Procedure: REMOVAL OF STONES;  Surgeon: Jeani Hawking, MD;  Location: [facility]COPY;  Service: Gastroenterology;;   Dennison Mascot  02/22/2022   Procedure: Dennison Mascot;  Surgeon: Elnoria Howard,  Luisa Hart, MD;  Location: [facility]COPY;  Service: Gastroenterology;;   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-C N/A 06/22/2021   Procedure: TRANSURETHRAL RESECTION OF BLADDER TUMOR;  Surgeon: Marcine Matar, MD;  Location: [facility];  Service: Urology;  Laterality: N/A;   TRANSURETHRAL RESECTION OF PROSTATE N/A 02/26/2013   Procedure: TRANSURETHRAL RESECTION OF THE PROSTATE WITH GYRUS INSTRUMENTS;  Surgeon: Marcine Matar, MD;  Location: [facility];  Service: Urology;  Laterality: N/A;   TRANSURETHRAL RESECTION OF PROSTATE N/A 06/22/2021    Procedure: TRANSURETHRAL RESECTION OF THE PROSTATE (TURP);  Surgeon: Marcine Matar, MD;  Location: [facility];  Service: Urology;  Laterality: N/A;   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   Insomnia    Prediabetes    Acute 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 (HCC)    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   Transaminitis 07/27/2021   UTI (urinary tract infection) 07/27/2021   CVA (cerebral vascular accident) (HCC) 07/27/2021   Fall 07/27/2021   Hyperglycemia 07/27/2021   Cholelithiasis 07/27/2021   Hypoxia 07/27/2021   Nausea and vomiting 07/27/2021   Acute metabolic encephalopathy 07/27/2021   Normocytic anemia 07/27/2021   Chronic back pain 07/27/2021   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   Closed fracture of first lumbar vertebra with routine healing 02/03/2021   Closed fracture of multiple ribs 11/18/2020   Anxiety 01/29/2020   Leg pain, bilateral 01/29/2020   Ingrown toenail 07/13/2019   Lumbar spondylosis 05/02/2018   Pain in left knee 03/09/2018   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   Trochanteric bursitis of left hip 01/16/2018   Preventative health care 09/26/2017   HTN (hypertension) 07/19/2015   Hyperlipidemia 07/19/2015   Great toe pain 02/11/2014   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   Obesity  (BMI 30-39.9) 06/25/2013   Renal insufficiency 06/25/2013   Weakness of left arm 06/25/2013   Sebaceous cyst 03/03/2011   Sprain of lumbar region 07/31/2010   Rib pain, left 08/29/2009   Carotid artery stenosis, asymptomatic, bilateral 05/02/2009   Eczema, atopic 05/31/2008   Vitamin D deficiency 03/01/2008   BPH (benign prostatic hyperplasia) 08/06/2007   Fasting hyperglycemia 12/21/2006   History of right MCA infarct 2006    ONSET DATE: 06/29/22  REFERRING DIAG:  Diagnosis  I63.9 (ICD-10-CM) - Cerebrovascular accident (CVA), unspecified mechanism (HCC)  W19.XXXD (ICD-10-CM) - Fall, subsequent encounter    THERAPY DIAG:  Muscle weakness (generalized)  Hemiplegia and hemiparesis following cerebral infarction affecting left non-dominant side (HCC)  Other lack of coordination  Acute pain of left shoulder  Difficulty in walking, not elsewhere classified  Repeated falls  Rationale for Evaluation and Treatment: Rehabilitation  SUBJECTIVE:  SUBJECTIVE STATEMENT: continues to have left neck and shoulder pain, he does not seem to be as cognitive today, still following directions but does perseverate on things PERTINENT HISTORY:  MAVIS FICHERA is a 82 year old man with dementia, CKD, HTN, CAD, HLD and history of CVA  (2006, 2021), Parkinson's  PAIN:  Are you having pain? No  PRECAUTIONS: Fall  WEIGHT BEARING RESTRICTIONS: No  FALLS: Has patient fallen in last 6 months? No  LIVING ENVIRONMENT: Lives with: lives with their family and lives with their spouse Lives in: House/apartment Stairs:  1 brick high step Has following equipment at home: Environmental consultant - 2 wheeled, Wheelchair (manual), Shower bench, bed side commode, Grab bars, and hospital bed, sliding pad for car  PLOF: Independent with  basic ADLs and Independent with household mobility without device  PATIENT GOALS: Walk around the block with his wife. Be able to use the dining room chair rather than have to use the W/C. Increased I with bed mobility and simple tasks at home like make some coffee or brush his teeth, Step over tub to shower, has bench, but he can't really use it. Up and down the one step to get to back deck.  OBJECTIVE:   DIAGNOSTIC FINDINGS:  MRI 2021 with degenerative changes in lumbar spine, L3-4 R subarticular stenosis  COGNITION: Overall cognitive status: History of cognitive impairments - at baseline   SENSATION: Not tested  COORDINATION: Moderately impaired BLE   MUSCLE LENGTH: Hamstrings: severely restricted B Thomas test: Severely restricted B.  POSTURE: rounded shoulders, forward head, increased thoracic kyphosis, posterior pelvic tilt, and flexed trunk   LOWER EXTREMITY ROM:   BLE extremely stiff throughout, limited hip ROM in all planes, B knees limited in extension.   LOWER EXTREMITY MMT:  3+/5 throughout. Unable to determine accuracy of MMT due to cognition. Functionally demosntrates poor coordinated activation and poor muscular endurance.   BED MOBILITY: Min A for Supine<> Sit. Patient was using a ladder in his hospital bed and could move MI.  TRANSFERS: Min A, mod VC, tends to lean back.  CURB: Min A-Has one step out to his deck.  STAIRS: N/A   GAIT: Gait pattern: step to pattern, decreased arm swing- Right, decreased arm swing- Left, decreased step length- Right, decreased step length- Left, shuffling, festinating, trunk flexed, narrow BOS, poor foot clearance- Right, and poor foot clearance- Left Distance walked: 58' Assistive device utilized: Environmental consultant - 2 wheeled Level of assistance: Occasional min A Comments: mod VC to stay close to walker, take longer steps.  FUNCTIONAL TESTS:  5 times sit to stand: 38.23  09/29/22  could not performs 5XSTS Timed up and go (TUG):  47.91  with RW, 07/21/22 TUG 29  seconds without device, 09/29/22 25 seconds  TODAY'S TREATMENT:  DATE:  10/11/22 Walk outside HHA on the right, cues for step length 2 rests gait was in the grass, on asphalt and up/dwn curbs Nustep level 5 x 6 minutes Volleyball in sitting Gait inside 100 feet with the right hand HHA x 6 Side stepping, fwd and backward walking 2# marches 2# LAQ 2# hip flexion and abduction up onto a 6" step while sitting Sit to stand was difficult for him  10/06/22 STm to the left neck and upper trap area 2.5# LAQ 2.5# marches Red tband HS curls Red tband hip adduction Red tband hip abduction Red tband ankle exercises Nustep level 4 x 6 minutes Gait outside with HHA on the right 3x150'  09/29/22 Gait outside total 600 feet with 2 rests some walking in grass, uneven terrain See TUG, 5X STS above Worked on bed mobility and on our harder surfaces he is independent with in and out of bed and can position himself Volleyball  Side stepping, backward walking  09/27/22 Nustep level 5 x 5 minutes Gait outside halfway around the parking [place] then rest, then back to this building and a rest outsie and then in the grass around to the front door with CGA/HHA on the right and some cues for step length Standing in Pbars marching and hip abduction 2x10 each Fwd/bkwd walking in the Pbars Seated volleyball Standing kicks Direction changes Seated 6" clears to practice in and out of car Gait back out to the car  09/22/2022 Nustep level 5 x 5 minutes Bike level 5 x 6 minutes Gait outside 1/2 parking [place] then sit and rest and then back around, right HHA and use of gait belt Standing straight arm pulls 5# one at a time Sit to stand with weighted ball and a higher level seating surface Standing left leg 4" toe clears Standing with right foot on 4"  step for weight bearing and use of the left leg Standing and turning picking up cones and placing them other areas Folding laundry from one surface and turning and putting on another surface, needs some cues to use the left arm   09/20/22: NuStep L4 x 6 minutes Stair taps with opposite legs, BUE support, x 5 each, CGA Long steps across air ex planks placed horizontally, UUE support on rail of parallel bars, cues to face forward, support weight with R hand on bar.  Repeat with side stepping each direction Practiced sit to stand in parallel bars, emphasize forward weight shift, able to rise with CGA Moved to mat to continue forward weight shifts and sit to stand, CGA Ambulated x 70', with CGA, x 3, in between sessions at ll bars.festinating gait . Seated forward trunk leans, with large therapeutic ball  2 sets 10 reps Seated forward rolls with ball , and press ups, partial standing , 2 sets 5 reps.  09/16/22 NuStep L4 x 6 minutes Functional re-assessment for PN-see goals Stair taps with opposite legs, BUE support, x 5 each, CGA Long steps across air ex planks placed horizontally, UUE support on rail of parallel bars. Repeat with side stepping each direction Standing weight shifts on air ex pad, no UE support, CGA, reluctant to weight shift forward and back. Practiced sit to stand in parallel bars, emphasize forward weight shift, able to rise with CGA Moved to mat to continue forward weight shifts and sit to stand, CGA Ambulated x 50', improved upright posture and foot clearance.  09/14/22 NuStep L5 x48mins UBE L2 forwards and back  Volleyball hits sitting  2x10 each hand  Leg ext 5# 2x10 HS curls10# 2x10 Ball squeezes 2x10 Hip abduction with green band 2x10  09/07/22 Nustep level 5 x 6 minutes  Tmill  MPH CGA x 1 minute Gait outside around the parking [place] with one rest break Stairs step over step In pbars marching and hip abduction Walking forward and backward in pbars Gait  outside in the grass  09/02/22 Nustep level 5 x 5 minutes Walking HHA outside halfway around the parking [place] and then rest on table, then back in 2.5# on the left ankle having him sit and raise foot up and abduct to a 6" step on the left Bed mobility rolling side to side [place], partial sit ups Walk 300 feet with HHA a lot of cues for the step length Ball kicks Volley ball standing and sitting  PATIENT EDUCATION: Education details: POC Person educated: Patient and Spouse Education method: Explanation Education comprehension: verbalized understanding  HOME EXERCISE PROGRAM: TBD  GOALS: Goals reviewed with patient? Yes  SHORT TERM GOALS: Target date: 07/19/22  I with initial HEP Baseline: Goal status: ongoing  2. Decrease 5 x STS to < 25 sec to demonstrate improved functional strength  Baseline: 38 sec  Goal status: 08/09/22 14 sec-met  3. Decrease TUG to < 35 sec to demonstrate improved functional balance  Baseline: 47 sec  Goal status: 15 seconds 09/29/22 met  4. Perform sit <> stand transfers from average chair height with S and VC to be able to use the swivel chair at his dining table.  Baseline: Min A  Goal status: 09/16/22-Inconsistently performs  with S from average height. At times requires CGA to cue forward weight shift. ongoing  LONG TERM GOALS: Target date: 09/29/22  Patient will be able to step over his tub at home using grab bars for stability with CGA to access his shower. Baseline: Unable- has bench, but wife reports he steps over then sits. Goal status: 09/16/22-met  2.  Decrease 5 x STS to < 18 sec to demonstrate improved functional strength Baseline:  Goal status: not met 62 seconds and able to do only 3 09/29/22  3.  Decrease TUG to < 25 sec to demonstrate improved functional balance Baseline:  Goal status: 09/29/22 15 seconds  4.  Patient will perform his bed mobility with MI. Baseline: Min A Goal status: on hard surface mat he is independent but  on soft bed at home he struggles  5.  Patient will ambulate at least 400' with LRAD, CGA, demonstrate improved upright posture, longer step length. Baseline:  Goal status: 09/16/22-Amb >400', but began to fatigue after about 300' with more flexed posture, L foot shuffling more, unable to maintain safe technique. ongoing  6.  Patient will manage up and down a single step with LRAD, CGA to access his deck at home. Baseline: Unable Goal status: 09/16/22-Up and down 1 step with U rail. met  ASSESSMENT:  CLINICAL IMPRESSION: Patient did okay with walking, still cues but seems to be taking better steps with the right HHA.  He had a little more difficulty talking today and did perseverate on things.  Toward the end after he was tired he had difficulty sitting, he was leaning back and could not correct OBJECTIVE IMPAIRMENTS: Abnormal gait, decreased activity tolerance, decreased balance, decreased cognition, decreased coordination, decreased endurance, decreased mobility, difficulty walking, decreased ROM, decreased strength, decreased safety awareness, impaired flexibility, impaired UE functional use, improper body mechanics, and postural dysfunction.   ACTIVITY LIMITATIONS: carrying, lifting,  bending, sitting, standing, squatting, stairs, transfers, bed mobility, bathing, toileting, dressing, hygiene/grooming, and locomotion level  PARTICIPATION LIMITATIONS: meal prep, cleaning, laundry, interpersonal relationship, shopping, and community activity  PERSONAL FACTORS: Age, Past/current experiences, and 3+ comorbidities: CVA, Parkinson's traits, dementia  are also affecting patient's functional outcome.   REHAB POTENTIAL: Good  CLINICAL DECISION MAKING: Evolving/moderate complexity  EVALUATION COMPLEXITY: Moderate  PLAN:  PT FREQUENCY: 2x/week  PT DURATION: 12 weeks  PLANNED INTERVENTIONS: Therapeutic exercises, Therapeutic activity, Neuromuscular re-education, Balance training, Gait training,  Patient/Family education, Self Care, Joint mobilization, Stair training, Moist heat, and Manual therapy  PLAN FOR NEXT SESSION:Continue to work on his balance and function Stacie Glaze, PT 10/11/22 3:39 PM

## 2022-10-12 ENCOUNTER — Ambulatory Visit: Payer: PPO | Admitting: Occupational Therapy

## 2022-10-12 ENCOUNTER — Other Ambulatory Visit (HOSPITAL_BASED_OUTPATIENT_CLINIC_OR_DEPARTMENT_OTHER): Payer: Self-pay

## 2022-10-12 DIAGNOSIS — M6281 Muscle weakness (generalized): Secondary | ICD-10-CM

## 2022-10-12 DIAGNOSIS — I69318 Other symptoms and signs involving cognitive functions following cerebral infarction: Secondary | ICD-10-CM

## 2022-10-12 DIAGNOSIS — M25512 Pain in left shoulder: Secondary | ICD-10-CM

## 2022-10-12 DIAGNOSIS — R2689 Other abnormalities of gait and mobility: Secondary | ICD-10-CM

## 2022-10-12 DIAGNOSIS — R41840 Attention and concentration deficit: Secondary | ICD-10-CM

## 2022-10-12 DIAGNOSIS — I69354 Hemiplegia and hemiparesis following cerebral infarction affecting left non-dominant side: Secondary | ICD-10-CM

## 2022-10-12 DIAGNOSIS — R278 Other lack of coordination: Secondary | ICD-10-CM

## 2022-10-13 ENCOUNTER — Encounter: Payer: Self-pay | Admitting: Physical Therapy

## 2022-10-13 ENCOUNTER — Other Ambulatory Visit (HOSPITAL_COMMUNITY): Payer: Self-pay | Admitting: Psychiatry

## 2022-10-13 ENCOUNTER — Other Ambulatory Visit (HOSPITAL_BASED_OUTPATIENT_CLINIC_OR_DEPARTMENT_OTHER): Payer: Self-pay

## 2022-10-13 ENCOUNTER — Telehealth (HOSPITAL_COMMUNITY): Payer: Self-pay | Admitting: *Deleted

## 2022-10-13 ENCOUNTER — Ambulatory Visit: Payer: PPO | Attending: Family Medicine | Admitting: Physical Therapy

## 2022-10-13 DIAGNOSIS — I69318 Other symptoms and signs involving cognitive functions following cerebral infarction: Secondary | ICD-10-CM | POA: Diagnosis not present

## 2022-10-13 DIAGNOSIS — R262 Difficulty in walking, not elsewhere classified: Secondary | ICD-10-CM | POA: Diagnosis not present

## 2022-10-13 DIAGNOSIS — M25512 Pain in left shoulder: Secondary | ICD-10-CM | POA: Diagnosis not present

## 2022-10-13 DIAGNOSIS — R41840 Attention and concentration deficit: Secondary | ICD-10-CM | POA: Diagnosis not present

## 2022-10-13 DIAGNOSIS — R296 Repeated falls: Secondary | ICD-10-CM | POA: Insufficient documentation

## 2022-10-13 DIAGNOSIS — R278 Other lack of coordination: Secondary | ICD-10-CM | POA: Diagnosis not present

## 2022-10-13 DIAGNOSIS — M6281 Muscle weakness (generalized): Secondary | ICD-10-CM | POA: Diagnosis not present

## 2022-10-13 DIAGNOSIS — I69354 Hemiplegia and hemiparesis following cerebral infarction affecting left non-dominant side: Secondary | ICD-10-CM | POA: Diagnosis not present

## 2022-10-13 DIAGNOSIS — R2689 Other abnormalities of gait and mobility: Secondary | ICD-10-CM | POA: Diagnosis not present

## 2022-10-13 MED ORDER — LOREEV XR 1 MG PO CS24
1.0000 mg | EXTENDED_RELEASE_CAPSULE | Freq: Every day | ORAL | 4 refills | Status: DC
  Filled 2022-10-13 – 2022-10-20 (×3): qty 30, 30d supply, fill #0

## 2022-10-13 NOTE — Telephone Encounter (Signed)
Writer spoke with pt's wife, Pranay Hilbun, who called requesting a refill of the Lorazepam XR 1 mg QHS. Pt last seen on 06/23/22 and has a f/u scheduled for 10/26/22. Please review.

## 2022-10-13 NOTE — Therapy (Signed)
OUTPATIENT PHYSICAL THERAPY NEURO Treatment   Patient Name: William [name] MRN: [id number] DOB:1940/09/29, 82 y.o., male Today's Date: 10/13/2022  PCP: Donato Schultz  DO REFERRING PROVIDER: same  END OF SESSION:  PT End of Session - 10/13/22 1626     Visit Number 28    Date for PT Re-Evaluation 10/30/22    PT Start Time 1608    PT Stop Time 1655    PT Time Calculation (min) 47 min    Activity Tolerance Patient tolerated treatment well    Behavior During Therapy [facility] for tasks assessed/performed                 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   HTN (hypertension) 07/19/2015   Hyperlipidemia    Major neurocognitive disorder 01/09/2014   Mild, related to stroke history   Nocturia    Renal insufficiency 06/25/2013   S/P carotid endarterectomy    BILATERAL ICA--  PATENT PER DUPLEX  05-19-2012   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   Right calf   Urinary frequency    Vitamin D deficiency    Past Surgical History:  Procedure Laterality Date   APPENDECTOMY  AS CHILD   CARDIOVASCULAR STRESS TEST  03-27-2012  DR CRENSHAW   LOW RISK LEXISCAN STUDY-- PROBABLE NORMAL PERFUSION AND SOFT TISSUE ATTENUATION/  NO ISCHEMIA/ EF 51%   CAROTID ENDARTERECTOMY Bilateral LEFT  11-12-2008  DR GREG HAYES   RIGHT ICA  2006  ([facility])   CHOLECYSTECTOMY N/A 02/23/2022   Procedure: LAPAROSCOPIC CHOLECYSTECTOMY;  Surgeon: Quentin Ore, MD;  Location: [facility] OR;  Service: General;  Laterality: N/A;   CYSTOSCOPY W/ RETROGRADES Bilateral 06/22/2021    Procedure: CYSTOSCOPY WITH RETROGRADE PYELOGRAM;  Surgeon: Marcine Matar, MD;  Location: [facility];  Service: Urology;  Laterality: Bilateral;   CYSTOSCOPY WITH LITHOLAPAXY N/A 02/26/2013   Procedure: CYSTOSCOPY WITH LITHOLAPAXY;  Surgeon: Marcine Matar, MD;  Location: [facility];  Service: Urology;  Laterality: N/A;   ENDOSCOPIC RETROGRADE CHOLANGIOPANCREATOGRAPHY (ERCP) WITH PROPOFOL N/A 02/22/2022   Procedure: ENDOSCOPIC RETROGRADE CHOLANGIOPANCREATOGRAPHY (ERCP) WITH PROPOFOL;  Surgeon: Jeani Hawking, MD;  Location: [facility] ENDOSCOPY;  Service: Gastroenterology;  Laterality: N/A;   EYE SURGERY  Jan. 2016   cataract surgery both eyes   INGUINAL HERNIA REPAIR Right 11-08-2006   IR KYPHO EA ADDL LEVEL THORACIC OR LUMBAR  02/12/2021   IR RADIOLOGIST EVAL & MGMT  02/18/2021   MASS EXCISION N/A 03/03/2016   Procedure: EXCISION OF BACK  MASS;  Surgeon: Almond Lint, MD;  Location: [facility];  Service: General;  Laterality: N/A;   MOHS SURGERY Left 1/ 2016   Dr Margo Aye-- Basal cell   PROSTATE SURGERY     REMOVAL OF STONES  02/22/2022   Procedure: REMOVAL OF STONES;  Surgeon: Jeani Hawking, MD;  Location: [facility] ENDOSCOPY;  Service: Gastroenterology;;   Dennison Mascot  02/22/2022   Procedure: Dennison Mascot;  Surgeon: Jeani Hawking, MD;  Location: [facility] ENDOSCOPY;  Service: Gastroenterology;;  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-C N/A 06/22/2021   Procedure: TRANSURETHRAL RESECTION OF BLADDER TUMOR;  Surgeon: Marcine Matar, MD;  Location: [facility];  Service: Urology;  Laterality: N/A;   TRANSURETHRAL RESECTION OF PROSTATE N/A 02/26/2013   Procedure: TRANSURETHRAL RESECTION OF THE PROSTATE WITH GYRUS INSTRUMENTS;  Surgeon: Marcine Matar, MD;  Location: [facility];  Service: Urology;  Laterality: N/A;   TRANSURETHRAL RESECTION OF PROSTATE N/A 06/22/2021   Procedure: TRANSURETHRAL RESECTION OF THE PROSTATE  (TURP);  Surgeon: Marcine Matar, MD;  Location: [facility];  Service: Urology;  Laterality: N/A;   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   Insomnia    Prediabetes    Acute 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 (HCC)    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   Transaminitis 07/27/2021   UTI (urinary tract infection) 07/27/2021   CVA (cerebral vascular accident) (HCC) 07/27/2021   Fall 07/27/2021   Hyperglycemia 07/27/2021   Cholelithiasis 07/27/2021   Hypoxia 07/27/2021   Nausea and vomiting 07/27/2021   Acute metabolic encephalopathy 07/27/2021   Normocytic anemia 07/27/2021   Chronic back pain 07/27/2021   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   Closed fracture of first lumbar vertebra with routine healing 02/03/2021   Closed fracture of multiple ribs 11/18/2020   Anxiety 01/29/2020   Leg pain, bilateral 01/29/2020   Ingrown toenail 07/13/2019   Lumbar spondylosis 05/02/2018   Pain in left knee 03/09/2018   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   Trochanteric bursitis of left hip 01/16/2018   Preventative health care 09/26/2017   HTN (hypertension) 07/19/2015   Hyperlipidemia 07/19/2015   Great toe pain 02/11/2014   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   Obesity (BMI 30-39.9) 06/25/2013   Renal insufficiency  06/25/2013   Weakness of left arm 06/25/2013   Sebaceous cyst 03/03/2011   Sprain of lumbar region 07/31/2010   Rib pain, left 08/29/2009   Carotid artery stenosis, asymptomatic, bilateral 05/02/2009   Eczema, atopic 05/31/2008   Vitamin D deficiency 03/01/2008   BPH (benign prostatic hyperplasia) 08/06/2007   Fasting hyperglycemia 12/21/2006   History of right MCA infarct 2006    ONSET DATE: 06/29/22  REFERRING DIAG:  Diagnosis  I63.9 (ICD-10-CM) - Cerebrovascular accident (CVA), unspecified mechanism (HCC)  W19.XXXD (ICD-10-CM) - Fall, subsequent encounter    THERAPY DIAG:  Muscle weakness (generalized)  Repeated falls  Hemiplegia and hemiparesis following cerebral infarction affecting left non-dominant side (HCC)  Acute pain of left shoulder  Difficulty in walking, not elsewhere classified  Rationale for Evaluation and Treatment: Rehabilitation  SUBJECTIVE:  SUBJECTIVE STATEMENT: Patient did have a slight fall back, not down to the floor but wife reports onto the potty chair, reports that he did not bend the knees, no injuries reported PERTINENT HISTORY:  NAYEF COLLEGE is a 82 year old man with dementia, CKD, HTN, CAD, HLD and history of CVA  (2006, 2021), Parkinson's  PAIN:  Are you having pain? No  PRECAUTIONS: Fall  WEIGHT BEARING RESTRICTIONS: No  FALLS: Has patient fallen in last 6 months? No  LIVING ENVIRONMENT: Lives with: lives with their family and lives with their spouse Lives in: House/apartment Stairs:  1 brick high step Has following equipment at home: Environmental consultant - 2 wheeled, Wheelchair (manual), Shower bench, bed side commode, Grab bars, and hospital bed, sliding pad for car  PLOF: Independent with basic ADLs and Independent with household mobility without  device  PATIENT GOALS: Walk around the block with his wife. Be able to use the dining room chair rather than have to use the W/C. Increased I with bed mobility and simple tasks at home like make some coffee or brush his teeth, Step over tub to shower, has bench, but he can't really use it. Up and down the one step to get to back deck.  OBJECTIVE:   DIAGNOSTIC FINDINGS:  MRI 2021 with degenerative changes in lumbar spine, L3-4 R subarticular stenosis  COGNITION: Overall cognitive status: History of cognitive impairments - at baseline   SENSATION: Not tested  COORDINATION: Moderately impaired BLE   MUSCLE LENGTH: Hamstrings: severely restricted B Thomas test: Severely restricted B.  POSTURE: rounded shoulders, forward head, increased thoracic kyphosis, posterior pelvic tilt, and flexed trunk   LOWER EXTREMITY ROM:   BLE extremely stiff throughout, limited hip ROM in all planes, B knees limited in extension.   LOWER EXTREMITY MMT:  3+/5 throughout. Unable to determine accuracy of MMT due to cognition. Functionally demosntrates poor coordinated activation and poor muscular endurance.   BED MOBILITY: Min A for Supine<> Sit. Patient was using a ladder in his hospital bed and could move MI.  TRANSFERS: Min A, mod VC, tends to lean back.  CURB: Min A-Has one step out to his deck.  STAIRS: N/A   GAIT: Gait pattern: step to pattern, decreased arm swing- Right, decreased arm swing- Left, decreased step length- Right, decreased step length- Left, shuffling, festinating, trunk flexed, narrow BOS, poor foot clearance- Right, and poor foot clearance- Left Distance walked: 56' Assistive device utilized: Environmental consultant - 2 wheeled Level of assistance: Occasional min A Comments: mod VC to stay close to walker, take longer steps.  FUNCTIONAL TESTS:  5 times sit to stand: 38.23  09/29/22  could not performs 5XSTS Timed up and go (TUG): 47.91  with RW, 07/21/22 TUG 29  seconds without device,  09/29/22 25 seconds  TODAY'S TREATMENT:  DATE:  10/13/22 Walk outside with wife and patient, had wife do the HHA and the CGA, gave her instruction in safety and verbal cues as well as tactile cues HS curls 20# 3x10 Leg extension 5# 3x10 Leg press 20# 2x10 5# single arm pulls 4" step to his left working on picking leg up and clearing Gait again 4 x 100 feet with me more in front of him and him trying to put his feet where I am, this seemed to help with the step length  10/11/22 Walk outside HHA on the right, cues for step length 2 rests gait was in the grass, on asphalt and up/dwn curbs Nustep level 5 x 6 minutes Volleyball in sitting Gait inside 100 feet with the right hand HHA x 6 Side stepping, fwd and backward walking 2# marches 2# LAQ 2# hip flexion and abduction up onto a 6" step while sitting Sit to stand was difficult for him  10/06/22 STm to the left neck and upper trap area 2.5# LAQ 2.5# marches Red tband HS curls Red tband hip adduction Red tband hip abduction Red tband ankle exercises Nustep level 4 x 6 minutes Gait outside with HHA on the right 3x150'  09/29/22 Gait outside total 600 feet with 2 rests some walking in grass, uneven terrain See TUG, 5X STS above Worked on bed mobility and on our harder surfaces he is independent with in and out of bed and can position himself Volleyball  Side stepping, backward walking  09/27/22 Nustep level 5 x 5 minutes Gait outside halfway around the parking [place] then rest, then back to this building and a rest outsie and then in the grass around to the front door with CGA/HHA on the right and some cues for step length Standing in Pbars marching and hip abduction 2x10 each Fwd/bkwd walking in the Pbars Seated volleyball Standing kicks Direction changes Seated 6" clears to practice in and out of  car Gait back out to the car  09/22/2022 Nustep level 5 x 5 minutes Bike level 5 x 6 minutes Gait outside 1/2 parking [place] then sit and rest and then back around, right HHA and use of gait belt Standing straight arm pulls 5# one at a time Sit to stand with weighted ball and a higher level seating surface Standing left leg 4" toe clears Standing with right foot on 4" step for weight bearing and use of the left leg Standing and turning picking up cones and placing them other areas Folding laundry from one surface and turning and putting on another surface, needs some cues to use the left arm   09/20/22: NuStep L4 x 6 minutes Stair taps with opposite legs, BUE support, x 5 each, CGA Long steps across air ex planks placed horizontally, UUE support on rail of parallel bars, cues to face forward, support weight with R hand on bar.  Repeat with side stepping each direction Practiced sit to stand in parallel bars, emphasize forward weight shift, able to rise with CGA Moved to mat to continue forward weight shifts and sit to stand, CGA Ambulated x 70', with CGA, x 3, in between sessions at ll bars.festinating gait . Seated forward trunk leans, with large therapeutic ball  2 sets 10 reps Seated forward rolls with ball , and press ups, partial standing , 2 sets 5 reps.  09/16/22 NuStep L4 x 6 minutes Functional re-assessment for PN-see goals Stair taps with opposite legs, BUE support, x 5 each, CGA Long steps across air  ex planks placed horizontally, UUE support on rail of parallel bars. Repeat with side stepping each direction Standing weight shifts on air ex pad, no UE support, CGA, reluctant to weight shift forward and back. Practiced sit to stand in parallel bars, emphasize forward weight shift, able to rise with CGA Moved to mat to continue forward weight shifts and sit to stand, CGA Ambulated x 50', improved upright posture and foot clearance.  09/14/22 NuStep L5 x61mins UBE L2  forwards and back  Volleyball hits sitting 2x10 each hand  Leg ext 5# 2x10 HS curls10# 2x10 Ball squeezes 2x10 Hip abduction with green band 2x10  09/07/22 Nustep level 5 x 6 minutes  Tmill  MPH CGA x 1 minute Gait outside around the parking [place] with one rest break Stairs step over step In pbars marching and hip abduction Walking forward and backward in pbars Gait outside in the grass  09/02/22 Nustep level 5 x 5 minutes Walking HHA outside halfway around the parking [place] and then rest on table, then back in 2.5# on the left ankle having him sit and raise foot up and abduct to a 6" step on the left Bed mobility rolling side to side [place], partial sit ups Walk 300 feet with HHA a lot of cues for the step length Ball kicks Volley ball standing and sitting  PATIENT EDUCATION: Education details: POC Person educated: Patient and Spouse Education method: Explanation Education comprehension: verbalized understanding  HOME EXERCISE PROGRAM: TBD  GOALS: Goals reviewed with patient? Yes  SHORT TERM GOALS: Target date: 07/19/22  I with initial HEP Baseline: Goal status: ongoing  2. Decrease 5 x STS to < 25 sec to demonstrate improved functional strength  Baseline: 38 sec  Goal status: 08/09/22 14 sec-met  3. Decrease TUG to < 35 sec to demonstrate improved functional balance  Baseline: 47 sec  Goal status: 15 seconds 09/29/22 met  4. Perform sit <> stand transfers from average chair height with S and VC to be able to use the swivel chair at his dining table.  Baseline: Min A  Goal status: 09/16/22-Inconsistently performs  with S from average height. At times requires CGA to cue forward weight shift. ongoing  LONG TERM GOALS: Target date: 09/29/22  Patient will be able to step over his tub at home using grab bars for stability with CGA to access his shower. Baseline: Unable- has bench, but wife reports he steps over then sits. Goal status: 09/16/22-met  2.  Decrease 5 x  STS to < 18 sec to demonstrate improved functional strength Baseline:  Goal status: not met 62 seconds and able to do only 3 09/29/22  3.  Decrease TUG to < 25 sec to demonstrate improved functional balance Baseline:  Goal status: 09/29/22 15 seconds  4.  Patient will perform his bed mobility with MI. Baseline: Min A Goal status: on hard surface mat he is independent but on soft bed at home he struggles  5.  Patient will ambulate at least 400' with LRAD, CGA, demonstrate improved upright posture, longer step length. Baseline:  Goal status: 09/16/22-Amb >400', but began to fatigue after about 300' with more flexed posture, L foot shuffling more, unable to maintain safe technique. ongoing  6.  Patient will manage up and down a single step with LRAD, CGA to access his deck at home. Baseline: Unable Goal status: 09/16/22-Up and down 1 step with U rail. met  ASSESSMENT:  CLINICAL IMPRESSION: Patient with a very good day, talked and  had his wife work with him with walk outside since that is one of their goals, working on hand placement and verbal and tactile cues.  He was able to do the 3 LE weight machines with very minimal verbal and visual cues.  Talked with him and wife regarding Adult day care and Nadine Counts being active with others and giving her some respite, gave info for [facility] OBJECTIVE IMPAIRMENTS: Abnormal gait, decreased activity tolerance, decreased balance, decreased cognition, decreased coordination, decreased endurance, decreased mobility, difficulty walking, decreased ROM, decreased strength, decreased safety awareness, impaired flexibility, impaired UE functional use, improper body mechanics, and postural dysfunction.   ACTIVITY LIMITATIONS: carrying, lifting, bending, sitting, standing, squatting, stairs, transfers, bed mobility, bathing, toileting, dressing, hygiene/grooming, and locomotion level  PARTICIPATION LIMITATIONS: meal prep, cleaning, laundry, interpersonal relationship,  shopping, and community activity  PERSONAL FACTORS: Age, Past/current experiences, and 3+ comorbidities: CVA, Parkinson's traits, dementia  are also affecting patient's functional outcome.   REHAB POTENTIAL: Good  CLINICAL DECISION MAKING: Evolving/moderate complexity  EVALUATION COMPLEXITY: Moderate  PLAN:  PT FREQUENCY: 2x/week  PT DURATION: 12 weeks  PLANNED INTERVENTIONS: Therapeutic exercises, Therapeutic activity, Neuromuscular re-education, Balance training, Gait training, Patient/Family education, Self Care, Joint mobilization, Stair training, Moist heat, and Manual therapy  PLAN FOR NEXT SESSION:Continue to work on his balance and function Stacie Glaze, PT 10/13/22 4:27 PM

## 2022-10-13 NOTE — Therapy (Signed)
OUTPATIENT OCCUPATIONAL THERAPY TREATMENT NOTE  Patient Name: William [name] MRN: [id number] DOB:May 22, 1941, 82 y.o., male Today's Date: 10/13/2022  PCP & REFERRING PROVIDER:  [name], [name], DO    END OF SESSION:                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   HTN (hypertension) 07/19/2015   Hyperlipidemia    Major neurocognitive disorder 01/09/2014   Mild, related to stroke history   Nocturia    Renal insufficiency 06/25/2013   S/P carotid endarterectomy    BILATERAL ICA--  PATENT PER DUPLEX  05-19-2012   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/26/2017   Right calf   Urinary frequency    Vitamin D deficiency    Past Surgical History:  Procedure Laterality Date   APPENDECTOMY  AS CHILD   CARDIOVASCULAR STRESS TEST  03-27-2012  DR CRENSHAW   LOW RISK LEXISCAN STUDY-- PROBABLE NORMAL PERFUSION AND SOFT TISSUE ATTENUATION/  NO ISCHEMIA/ EF 51%   CAROTID ENDARTERECTOMY Bilateral LEFT  11-12-2008  DR GREG HAYES   RIGHT ICA  2006  ([facility])   CHOLECYSTECTOMY N/A 02/23/2022   Procedure: LAPAROSCOPIC CHOLECYSTECTOMY;  Surgeon: Quentin Ore, MD;  Location: [facility];  Service: General;  Laterality: N/A;   CYSTOSCOPY W/ RETROGRADES Bilateral 06/22/2021   Procedure: CYSTOSCOPY WITH RETROGRADE PYELOGRAM;  Surgeon: Marcine Matar, MD;  Location: [facility];  Service: Urology;  Laterality: Bilateral;   CYSTOSCOPY WITH LITHOLAPAXY N/A 02/26/2013   Procedure: CYSTOSCOPY WITH LITHOLAPAXY;  Surgeon: Marcine Matar, MD;  Location:  [facility];  Service: Urology;  Laterality: N/A;   ENDOSCOPIC RETROGRADE CHOLANGIOPANCREATOGRAPHY (ERCP) WITH PROPOFOL N/A 02/22/2022   Procedure: ENDOSCOPIC RETROGRADE CHOLANGIOPANCREATOGRAPHY (ERCP) WITH PROPOFOL;  Surgeon: Jeani Hawking, MD;  Location: [facility] ENDOSCOPY;  Service: Gastroenterology;  Laterality: N/A;   EYE SURGERY  Jan. 2016   cataract surgery both eyes   INGUINAL HERNIA REPAIR Right 11-08-2006   IR KYPHO EA ADDL LEVEL THORACIC OR LUMBAR  02/12/2021   IR RADIOLOGIST EVAL & MGMT  02/18/2021   MASS EXCISION N/A 03/03/2016   Procedure: EXCISION OF BACK  MASS;  Surgeon: Almond Lint, MD;  Location: [facility];  Service: General;  Laterality: N/A;   MOHS SURGERY Left 1/ 2016   Dr Margo Aye-- Basal cell   PROSTATE SURGERY     REMOVAL OF STONES  02/22/2022   Procedure: REMOVAL OF STONES;  Surgeon: Jeani Hawking, MD;  Location: [facility] ENDOSCOPY;  Service: Gastroenterology;;   Dennison Mascot  02/22/2022   Procedure: Dennison Mascot;  Surgeon: Jeani Hawking, MD;  Location: [facility] ENDOSCOPY;  Service: Gastroenterology;;   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-C N/A 06/22/2021   Procedure: TRANSURETHRAL RESECTION OF BLADDER TUMOR;  Surgeon: Marcine Matar, MD;  Location: [facility];  Service: Urology;  Laterality: N/A;   TRANSURETHRAL RESECTION OF PROSTATE N/A 02/26/2013   Procedure: TRANSURETHRAL RESECTION OF THE PROSTATE WITH GYRUS INSTRUMENTS;  Surgeon: Marcine Matar, MD;  Location: [facility];  Service: Urology;  Laterality: N/A;   TRANSURETHRAL RESECTION OF PROSTATE N/A 06/22/2021   Procedure: TRANSURETHRAL RESECTION OF THE PROSTATE (TURP);  Surgeon: Marcine Matar, MD;  Location: [facility];  Service: Urology;  Laterality: N/A;   Patient Active Problem List   Diagnosis Date Noted   Dysuria 08/03/2022   Nonintractable headache 07/01/2022   Bilateral impacted cerumen 06/24/2022   Rash 06/15/2022    Postoperative ileus (HCC) 02/27/2022   Ileus, postoperative (HCC) 02/26/2022   Choledocholithiasis 02/19/2022   DNR (do not resuscitate) 02/19/2022   Left elbow pain 01/26/2022   Mid back pain on left side 01/26/2022   Rib pain 01/26/2022   Acute pain of left shoulder 11/12/2021   Leukocytes in urine 11/12/2021   Urinary frequency 11/12/2021   Thrush 10/08/2021   Hemiplegia, 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   Insomnia    Prediabetes    Acute 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 (HCC)    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   Transaminitis 07/27/2021   UTI (urinary tract infection) 07/27/2021   CVA (cerebral vascular accident) (HCC) 07/27/2021   Fall 07/27/2021   Hyperglycemia 07/27/2021   Cholelithiasis 07/27/2021   Hypoxia 07/27/2021   Nausea and vomiting 07/27/2021   Acute metabolic encephalopathy 07/27/2021   Normocytic anemia 07/27/2021   Chronic back pain 07/27/2021   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   Closed fracture of first lumbar vertebra with routine healing 02/03/2021   Closed fracture of multiple ribs 11/18/2020   Anxiety 01/29/2020   Leg pain, bilateral 01/29/2020   Ingrown toenail 07/13/2019   Lumbar spondylosis 05/02/2018   Pain in left knee 03/09/2018   Osteoarthritis of left hip 01/16/2018   Trochanteric bursitis of left hip 01/16/2018   Preventative health care 09/26/2017   HTN (hypertension) 07/19/2015   Hyperlipidemia 07/19/2015   Great toe pain 02/11/2014   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9 (ICD-10-CM) - Cerebrovascular accident (CVA), unspecified mechanism (HCC)  W19.XXXD (ICD-10-CM) - Fall, subsequent encounter    THERAPY DIAG:  Muscle weakness (generalized)  Hemiplegia and hemiparesis following cerebral infarction affecting left non-dominant side (HCC)  Other lack of coordination  Acute pain of left shoulder  Attention and concentration deficit  Other symptoms and signs involving cognitive functions following cerebral infarction  Other abnormalities of gait and mobility  Rationale for Evaluation and Treatment: Rehabilitation  PERTINENT HISTORY: PMHx includes L basal ganglia and corona radiata infarct 07/27/21, L lower homonymous quadrantanopia; R MCA infarct in 2006 w/ L-sided hemiparesis, NCD, carotid artery disease (bilateral), HTN, HLD,CKD3, vascular dementia, and hx of bladder cancer. He has had falls in 2023, fracturing his clavicle in August, then he had stomach surgery in Sept 2023. He has He needs assist at baseline for ADLs. When last documented in acute rehab after having his gall-bladder removed, (03/09/22) he required moderate  to maximum assist x2 for most ADLs (more assist for ADLs the require standing), and they recommended rehab in skilled nursing setting. He apparently got Covid at some point and also attended a SNF. Once he D/C home, he had some episodes of combativeness at night  with "sun-downing."  He is communicative and appropriate, though appears easily confused and distracted, not fully oriented to time and place (Ax2, states date is 10 and cannot say what city he lives in). His wife provides most details, and he is participative, though a poor historian at times. In a nut shell- he has been  suffering from dementia and post-stroke effects for years, and his issues were recently exacerbated in Sept of last year after abdominal surgery. He has less use of Lt "stroke side" and new, ritualistic "ticks" in mouth and Rt hand/arm.  Wife states that they do have a caregiver come M, W, F for 4 hours in the middle of the day to mainly help him bathe and dress, which usually tires him out. He has been more combative towards the end of the day as well.   PRECAUTIONS: Fall and Other: Lt hemi, poor cognition, LATEX and adhesive allergies, etc.  ;WEIGHT BEARING RESTRICTIONS: No   SUBJECTIVE:   SUBJECTIVE STATEMENT: Pt reports neck pain  Pt accompanied by:  his wife   PAIN:  Are you having pain? Yes: NPRS scale: 3-4/10 Pain location: L shoulder and neck Pain description: aching Aggravating factors: malpositioning, use Relieving factors: heat     FALLS: Has patient fallen in last 6 months? Yes. Number of falls at least 1 resulting in clavicle fx (now healed)   LIVING ENVIRONMENT: Lives with: lives with their spouse Lives in: House/apartment Has following equipment at home: manual w/c, 2 w/w, weighted utensils, shower bench (for tub), bed cane, hospital bed, raised toilet, and possibly more  PLOF: Needs assistance with ADLs, Needs assistance with homemaking, Needs assistance with gait, and Needs assistance with transfers  PATIENT GOALS: at eval- increase personal hygiene skills (brush teeth, shaving)  prepare instant coffee or microwave meal improve functional cognitive skills to play games, etc.,   increase Lt arm usage try to help decrease Rt hand/arm perseveration (flicking, tapping, noise making) Improve fnl tranfers in/out of multiple seats (tub bench, swivel chair, etc.)    OBJECTIVE: (All objective assessments below are from initial evaluation on: 07/07/22 unless otherwise specified.)   HAND DOMINANCE: Right  ADLs: Overall ADLs: Wife states decreased ability since  hospitalization in Sept 23.  Transfers/ambulation related to ADLs: Min A transfer, CGA for mobility Eating: set up Grooming: Mod A- needs assist with oral care, shaving, but can comb his hair UB Dressing: Min A  LB Dressing: Dep  Toileting:  Mod  Bathing: Mod  Tub Shower transfers: Min A  Equipment: Emergency planning/management officer, Grab bars, and Feeding equipment  IADLs: completely dependent for years now, except some ability to use microwave which is now not possible after hospitalization   MOBILITY STATUS: Needs Assist: CGA - Min A, Hx of falls, Festination, difficulty with turns, and neglect on Lt side  POSTURE COMMENTS:  rounded shoulders, forward head, and weight shift left  ACTIVITY TOLERANCE: Activity tolerance: he became tired and somewhat agitated by the end of the OT evaluation today (after PT and OT about 2.5hours with mostly sedate activities in seated positions)  FUNCTIONAL OUTCOME MEASURES: [facility] 17/30  showing problems with visuospatial/executive function, attention, fluency, and orientation. (at least 26/30 is needed to be considered "normal")  Trinna Balloon in ADLs: 1/6  (very dependent)  UPPER EXTREMITY ROM:    Active ROM Right eval Left eval  Shoulder scaption 100 85  Shoulder abduction    Shoulder adduction    Shoulder extension    Shoulder internal rotation Reaches small of back Reaches to hip  Shoulder external rotation Reaches back of head  Reaches up to ear  Elbow flexion 150 130  Elbow extension (-10*)  (-35*)  Wrist flexion 35 20  Wrist extension 50 10  (Blank rows = not tested)  UPPER EXTREMITY MMT:    Tested grossly as "push" and "pull": UE push 4-/5, UE pull 4/5  HAND FUNCTION: Grip strength: Right: 30 lbs; Left: 20 lbs  COORDINATION: Finger Nose Finger test: mildly ataxic but fairly accurate  Box and Blocks:  Right 11blocks, Left 5blocks  MUSCLE TONE:  Rt- normal, Lt- decreased and worsened by inattention/disuse  COGNITION: Overall cognitive  status: Impaired and Difficulty to assess due to: severity of deficits; Poor attention, fluency, orientation, and executive function. Recall is fairly good as well as abstraction.  Seems to have personality changes due to vascular dementia- like an OCD type volitional perseveration of tapping with Rt hand that he can somewhat control.  He did become angry and agitated at the end of the session performing car transfer with spouse: he did shout at her.  VISION: Subjective report: no new changes per pt/spouse, uses "cheaters"   VISION ASSESSMENT: Visual fields seem intact though he had latent responses on the left side showing likely inattention/and/neglect, tracking somewhat delayed horizontally and vertically  PERCEPTION: Impaired: Inattention/neglect: does not attend to left visual field and does not attend to left side of body, Body scheme: poor quality of recognition with shaving, etc., and Spatial orientation: decreased in print and in orientation, especially to Lt side  PRAXIS: Not tested specifically but no overt/stated issues using pen, wearing glasses ,etc.   OBSERVATIONS: Pt taps and flicks and does seem to volitionally perseverate on making a rhythm/noise with Rt hand. He states he is aware of it and chooses to do it.  He does also seem to have mild resting and intention tremor.    TODAY'S TREATMENT:     DATE:10/12/22- Pt's wife reports pt. has continued neck pain today.Supine hotpack applied to neck x approximately 10 mins while therapist performed gentle stretch to shoulders in retraction and depression followed by pt performing closed chain shoulder flexion and chest press, min-mod facilitation, v.c Sitting pt doffed his flannel shirt with supervision and min v.c  Standing at sink, functional reaching laterally and with trunk rotation with right and left UE's, v.c for visual target for upright posture followed by removing dowel pegs from pegboard with LUE, then replacing with RUE,  min-mod v.c and supervision for balance. Discussion with pt. And wife regarding plans to potentially discharge from OT or decrease frequency at the end of May due to pt progress plateauing. Therapist to provide pt's wife with information regarding Senior programs in the future. Pt's wife also asked about activities to promote cognition.  Pt may have access to an I pad in the future and therapist suggested constant therapy or other cognitive games on I pad.  10/07/22 Pt reports neck and shoulder pain today. It appears related to posture and left shoulder hike. Supine hotpack applied to neck x approximately 10 mins while therapist performed gentle stretch to shoulders in retraction and depression followed by pt performing closed chain shoulder flexion and chest press, mod facilitation Seated  trunk and head rotation tap wall with pole on left side for left neck rotation. Verbal cues for upright posture, mirror and visual target uses. Self stretch closed chain reaching for floor and chest press, mod v.c and min-mod facilitation Placing pegs into pegboard with RUE then removing with LUE, min v.c  10/05/22- Pt reports neck and shoulder pain today. It appears related to posture and left shoulder hike. Supine hotpack applied to neck x approximately 10 mins while pt performed closed chain shoulder flexion and chest press. Mod facilitation. Functional reaching with left and right UE's to retrieve and replace beanbags. Standing with hands on fitter poles pt rocked forwards and backwards with shoulder flexion then retraction mod v.c and min facilitation. Attempted weight shifts side to side, max v.c and target for weightshifts. Seated tapping boomwhakers to target , mod v.c  for shoulder flexion and functional reach to targets. Pt practiced opening coffee container several times, accurately, in standing and seated pt's wife reports pt sometimes has difficulty at home. Opening the newspaper with min -mod v.c for LUE  functional use.   PATIENT EDUCATION: Education details: See treatment section above for details,  Person educated: Patient and Spouse Education method: Explanation, Demonstration, Tactile cues, and Verbal cues Education comprehension: verbalized understanding, returned demonstration, verbal cues required, tactile cues required, and needs further education  HOME EXERCISE PROGRAM: Access Code: [email or web address] URL: [email or web address] Date: 07/21/2022 Prepared by: Fannie Knee Exercises - Seated Shoulder Flexion AAROM with Dowel  - 4-6 x daily - 1 sets - 10-15 reps - Seated Shoulder Abduction AAROM with Dowel  - 4-6 x daily - 1 sets - 10-15 reps - Seated Shoulder External Rotation AAROM with Dowel  - 4-6 x daily - 1 sets - 10-15 reps - Seated Bicep Curls with Bar  - 4-6 x daily - 1 sets - 10-15 reps - Seated Shoulder Row with Anchored Resistance  - 4-6 x daily - 1 sets - 10-15 reps     GOALS: Goals reviewed with patient? Yes   SHORT TERM GOALS: (STG required if POC>30 days) Target Date: 09/23/22   Pt will demo/state understanding of initial HEP (or home activity suggestions to maintain function as appropriate) to improve functional skills. Goal status: 07/28/22- Met (addressed in past 2 sessions) 2. Pt will donn a t-shirt consistently with min A and v.c   Goal status: met , per wife's report 3. Pt will demonstrate ability to open the newspaper  with supervision min v.c Goal status:  ongoing, inconsistent, min , mod v.c at times 10/05/22 4.  Pt will consistently sustain focus to a simple functional task  in a minimally distracting environment x 6 mins with no more than 1 re-direct(ie: eating, functional task in clinic)  Goal status:  improved but inconsistent  however pt has been able to sutain attention to several functional tasks in clinic  x 6 mins several times recently 10/05/22  5. Pt will consistently open containers with only supervision and min v.c           Goal  status: partially met,  opens most containers at this level  in the clinic, however sometimes has difficulty with coffee containers at home sometimes, per wife's report. 10/05/22    LONG TERM GOALS: Target Date: 11/16/22  Pt will improve functional ability by decreased impairment per Trinna Balloon assessment from 1/6 to 2/6 or better, for better quality of life. Goal status:  not met 1/6  2.  Caregiver will state his grooming ability/hygiene ability  improved from moderate assist to supervision assist. Goal status: not met-Pt continues to require mod A.  3.  He will show better functional use of left arm by improved box and blocks Test score from 5 blocks to at least 10 blocks. Goal status:not met but improved 8 blocks  4.  Pt will show ability to microwave instant coffee or small meal safely, to help decrease caregiver burden. Goal status: partially met, pt is able to microwave coffee with supervision, however occasional min A to open containers 5.  He will show improved cognitive ability enough to play a simple game with OT and caregiver. Goal status: approximating goal, pt has played a game of uno at home with family.  6.  Caregiver will state at least 4 ways to decrease perseveration with right arm, also stating that this behaviors at least somewhat better in the home now. Goal status: deferred perseverative behaviors have improved.  7. Pt will donn a flannel button front shirt consistently with min A and min v.c Baseline: mod-max Goal status:  ongoing, inconsitent min-mod A, min-mod v.c 09/15/22, pt doffed today with supervision and v.c 10/12/22 8.  Pt will use his LUE as an active assist for ADLS grossly 40% of the time with min v.c Baseline: uses 25% Goal status:  ongoing inconsistent use of RUE 10/12/22  9 Pt will stand at the countertop and perform functional activities  incorporating bilateral UE's with supervision and min v.c  Goal status:  ongoing, min assist at  time-09/15/22 10. Pt will back up to a chair and sit down squarely at least 1/3 of the time with min v.c  Goal status: ongoing , performs in clinic with min-mod v.c 09/15/22 11..  Pt will open the newspaper and locate sports section with no more than min v.c                          Goal status:  ongoing, min-mod A at times 09/15/22  ASSESSMENT:  CLINICAL IMPRESSION: Pt is progressing slowly towards goals. He remains limited by neck and shoulder pain as well as cognition which limits pt's ability to consistently perform functional activities at home. Therapist initiated discussion regarding potential d/c at end of May.  OT Frequency/DURATION: 2x week x 12 weeks however anticipate d/c after 8 weeks dependent on progress  PLANNED INTERVENTIONS: self care/ADL training, therapeutic exercise, therapeutic activity, neuromuscular re-education, manual therapy, balance training, functional mobility training, moist heat, cryotherapy, patient/family education, cognitive remediation/compensation, visual/perceptual remediation/compensation, psychosocial skills training, energy conservation, coping strategies training, DME and/or AE instructions, and Re-evaluation  RECOMMENDED OTHER SERVICES: He has already seen PT for balance and functional mobility, he would probably benefit from speech for cognitive issues as well, though OT will be working with cognition somewhat functionally now.  CONSULTED AND AGREED WITH PLAN OF CARE: Patient and family member/caregiver  PLAN FOR NEXT SESSION:    Work towards goals, continue to   monitor neck pain, ADLS, LUE functional use    Keene Breath, OTR/L 10:56 AM 10/13/22

## 2022-10-14 ENCOUNTER — Other Ambulatory Visit (HOSPITAL_BASED_OUTPATIENT_CLINIC_OR_DEPARTMENT_OTHER): Payer: Self-pay

## 2022-10-14 ENCOUNTER — Ambulatory Visit: Payer: PPO | Admitting: Occupational Therapy

## 2022-10-14 ENCOUNTER — Other Ambulatory Visit (HOSPITAL_COMMUNITY): Payer: Self-pay

## 2022-10-14 ENCOUNTER — Encounter: Payer: Self-pay | Admitting: Occupational Therapy

## 2022-10-14 DIAGNOSIS — M25512 Pain in left shoulder: Secondary | ICD-10-CM

## 2022-10-14 DIAGNOSIS — R41840 Attention and concentration deficit: Secondary | ICD-10-CM

## 2022-10-14 DIAGNOSIS — I69354 Hemiplegia and hemiparesis following cerebral infarction affecting left non-dominant side: Secondary | ICD-10-CM

## 2022-10-14 DIAGNOSIS — I69318 Other symptoms and signs involving cognitive functions following cerebral infarction: Secondary | ICD-10-CM

## 2022-10-14 DIAGNOSIS — R2689 Other abnormalities of gait and mobility: Secondary | ICD-10-CM

## 2022-10-14 DIAGNOSIS — R262 Difficulty in walking, not elsewhere classified: Secondary | ICD-10-CM

## 2022-10-14 DIAGNOSIS — M6281 Muscle weakness (generalized): Secondary | ICD-10-CM | POA: Diagnosis not present

## 2022-10-14 DIAGNOSIS — R278 Other lack of coordination: Secondary | ICD-10-CM

## 2022-10-14 NOTE — Therapy (Signed)
OUTPATIENT OCCUPATIONAL THERAPY TREATMENT NOTE  Patient Name: William [name] MRN: [id number] DOB:1940-10-06, 82 y.o., male Today's Date: 10/14/2022  PCP & REFERRING PROVIDER:  [name], [name], DO    END OF SESSION:  OT End of Session - 10/14/22 1509     Visit Number 23    Number of Visits 32    Date for OT Re-Evaluation 11/16/22    Authorization Type [employer]    Authorization - Visit Number 23    Progress Note Due on Visit 30    OT Start Time 1402    OT Stop Time 1445    OT Time Calculation (min) 43 min    Activity Tolerance Patient tolerated treatment well    Behavior During Therapy WFL for tasks assessed/performed                          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   HTN (hypertension) 07/19/2015   Hyperlipidemia    Major neurocognitive disorder 01/09/2014   Mild, related to stroke history   Nocturia    Renal insufficiency 06/25/2013   S/P carotid endarterectomy    BILATERAL ICA--  PATENT PER DUPLEX  05-19-2012   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/26/2017   Right calf   Urinary frequency    Vitamin D deficiency    Past Surgical History:  Procedure Laterality Date   APPENDECTOMY  AS CHILD   CARDIOVASCULAR STRESS TEST  03-27-2012  DR CRENSHAW   LOW RISK LEXISCAN STUDY-- PROBABLE NORMAL PERFUSION AND SOFT TISSUE ATTENUATION/  NO ISCHEMIA/ EF 51%   CAROTID ENDARTERECTOMY Bilateral LEFT  11-12-2008  DR GREG HAYES   RIGHT ICA  2006  ([facility])   CHOLECYSTECTOMY N/A 02/23/2022   Procedure: LAPAROSCOPIC  CHOLECYSTECTOMY;  Surgeon: Quentin Ore, MD;  Location: [facility] OR;  Service: General;  Laterality: N/A;   CYSTOSCOPY W/ RETROGRADES Bilateral 06/22/2021   Procedure: CYSTOSCOPY WITH RETROGRADE PYELOGRAM;  Surgeon: Marcine Matar, MD;  Location: [facility];  Service: Urology;  Laterality: Bilateral;   CYSTOSCOPY WITH LITHOLAPAXY N/A 02/26/2013   Procedure: CYSTOSCOPY WITH LITHOLAPAXY;  Surgeon: Marcine Matar, MD;  Location: [facility];  Service: Urology;  Laterality: N/A;   ENDOSCOPIC RETROGRADE CHOLANGIOPANCREATOGRAPHY (ERCP) WITH PROPOFOL N/A 02/22/2022   Procedure: ENDOSCOPIC RETROGRADE CHOLANGIOPANCREATOGRAPHY (ERCP) WITH PROPOFOL;  Surgeon: Jeani Hawking, MD;  Location: [facility] ENDOSCOPY;  Service: Gastroenterology;  Laterality: N/A;   EYE SURGERY  Jan. 2016   cataract surgery both eyes   INGUINAL HERNIA REPAIR Right 11-08-2006   IR KYPHO EA ADDL LEVEL THORACIC OR LUMBAR  02/12/2021   IR RADIOLOGIST EVAL & MGMT  02/18/2021   MASS EXCISION N/A 03/03/2016   Procedure: EXCISION OF BACK  MASS;  Surgeon: Almond Lint, MD;  Location: [facility];  Service: General;  Laterality: N/A;   MOHS SURGERY Left 1/ 2016   Dr Margo Aye-- Basal cell   PROSTATE SURGERY     REMOVAL OF  STONES  02/22/2022   Procedure: REMOVAL OF STONES;  Surgeon: Jeani Hawking, MD;  Location: [facility] ENDOSCOPY;  Service: Gastroenterology;;   Dennison Mascot  02/22/2022   Procedure: Dennison Mascot;  Surgeon: Jeani Hawking, MD;  Location: [facility] ENDOSCOPY;  Service: Gastroenterology;;   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-C N/A 06/22/2021   Procedure: TRANSURETHRAL RESECTION OF BLADDER TUMOR;  Surgeon: Marcine Matar, MD;  Location: [facility];  Service: Urology;  Laterality: N/A;   TRANSURETHRAL RESECTION OF PROSTATE N/A 02/26/2013   Procedure: TRANSURETHRAL RESECTION OF THE PROSTATE WITH GYRUS INSTRUMENTS;  Surgeon: Marcine Matar, MD;  Location: [facility];  Service: Urology;  Laterality: N/A;   TRANSURETHRAL RESECTION OF PROSTATE N/A 06/22/2021   Procedure: TRANSURETHRAL RESECTION OF THE PROSTATE (TURP);  Surgeon: Marcine Matar, MD;  Location: [facility];  Service: Urology;  Laterality: N/A;   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   Insomnia    Prediabetes    Acute 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 (HCC)    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   Transaminitis 07/27/2021   UTI (urinary tract infection) 07/27/2021   CVA (cerebral vascular accident) (HCC) 07/27/2021   Fall 07/27/2021   Hyperglycemia 07/27/2021   Cholelithiasis 07/27/2021   Hypoxia 07/27/2021   Nausea and vomiting 07/27/2021   Acute metabolic encephalopathy 07/27/2021   Normocytic anemia 07/27/2021   Chronic back pain 07/27/2021   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   Closed fracture of first lumbar vertebra with routine healing 02/03/2021   Closed fracture of multiple ribs 11/18/2020   Anxiety 01/29/2020   Leg pain, bilateral 01/29/2020   Ingrown toenail 07/13/2019   Lumbar spondylosis 05/02/2018   Pain in left knee 03/09/2018   Osteoarthritis of left hip 01/16/2018   Trochanteric bursitis of left hip 01/16/2018   Preventative health care 09/26/2017   HTN (hypertension) 07/19/2015    Hyperlipidemia 07/19/2015   Great toe pain 02/11/2014   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   Obesity (BMI 30-39.9) 06/25/2013   Renal insufficiency 06/25/2013   Weakness of left arm 06/25/2013   Sebaceous cyst 03/03/2011   Sprain of lumbar region 07/31/2010   Rib pain, left 08/29/2009   Carotid artery stenosis, asymptomatic, bilateral 05/02/2009   Eczema, atopic 05/31/2008   Vitamin D deficiency 03/01/2008   BPH (benign prostatic hyperplasia) 08/06/2007   Fasting hyperglycemia 12/21/2006   History of right MCA infarct 2006    ONSET DATE: Acute exacerbation of chronic symptoms (most recent hospitalization 02/26/22 - 03/12/22, followed by SNF and home health)   REFERRING DIAG:  I63.9 (ICD-10-CM) - Cerebrovascular accident (CVA), unspecified mechanism (HCC)  W19.XXXD (ICD-10-CM) - Fall, subsequent encounter    THERAPY DIAG:  Muscle weakness (generalized)  Hemiplegia and hemiparesis following cerebral infarction affecting left non-dominant side (HCC)  Acute pain of left shoulder  Difficulty in walking, not elsewhere classified  Other lack  of coordination  Attention and concentration deficit  Other symptoms and signs involving cognitive functions following cerebral infarction  Other abnormalities of gait and mobility  Rationale for Evaluation and Treatment: Rehabilitation  PERTINENT HISTORY: PMHx includes L basal ganglia and corona radiata infarct 07/27/21, L lower homonymous quadrantanopia; R MCA infarct in 2006 w/ L-sided hemiparesis, NCD, carotid artery disease (bilateral), HTN, HLD,CKD3, vascular dementia, and hx of bladder cancer. He has had falls in 2023, fracturing his clavicle in August, then he had stomach surgery in Sept 2023. He has He needs assist at baseline for ADLs. When last documented in acute rehab after having his gall-bladder removed, (03/09/22) he required moderate to maximum assist x2 for most ADLs (more assist for ADLs the require standing), and  they recommended rehab in skilled nursing setting. He apparently got Covid at some point and also attended a SNF. Once he D/C home, he had some episodes of combativeness at night  with "sun-downing."  He is communicative and appropriate, though appears easily confused and distracted, not fully oriented to time and place (Ax2, states date is 59 and cannot say what city he lives in). His wife provides most details, and he is participative, though a poor historian at times. In a nut shell- he has been suffering from dementia and post-stroke effects for years, and his issues were recently exacerbated in Sept of last year after abdominal surgery. He has less use of Lt "stroke side" and new, ritualistic "ticks" in mouth and Rt hand/arm.  Wife states that they do have a caregiver come M, W, F for 4 hours in the middle of the day to mainly help him bathe and dress, which usually tires him out. He has been more combative towards the end of the day as well.   PRECAUTIONS: Fall and Other: Lt hemi, poor cognition, LATEX and adhesive allergies, etc.  ;WEIGHT BEARING RESTRICTIONS: No   SUBJECTIVE:   SUBJECTIVE STATEMENT: Pt reports neck pain  Pt accompanied by:  his wife   PAIN:  Are you having pain? Yes: NPRS scale: 3-4/10 Pain location: L shoulder and neck Pain description: aching Aggravating factors: malpositioning, use Relieving factors: heat     FALLS: Has patient fallen in last 6 months? Yes. Number of falls at least 1 resulting in clavicle fx (now healed)   LIVING ENVIRONMENT: Lives with: lives with their spouse Lives in: House/apartment Has following equipment at home: manual w/c, 2 w/w, weighted utensils, shower bench (for tub), bed cane, hospital bed, raised toilet, and possibly more  PLOF: Needs assistance with ADLs, Needs assistance with homemaking, Needs assistance with gait, and Needs assistance with transfers  PATIENT GOALS: at eval- increase personal hygiene skills (brush teeth,  shaving)  prepare instant coffee or microwave meal improve functional cognitive skills to play games, etc.,   increase Lt arm usage try to help decrease Rt hand/arm perseveration (flicking, tapping, noise making) Improve fnl tranfers in/out of multiple seats (tub bench, swivel chair, etc.)    OBJECTIVE: (All objective assessments below are from initial evaluation on: 07/07/22 unless otherwise specified.)   HAND DOMINANCE: Right  ADLs: Overall ADLs: Wife states decreased ability since hospitalization in Sept 23.  Transfers/ambulation related to ADLs: Min A transfer, CGA for mobility Eating: set up Grooming: Mod A- needs assist with oral care, shaving, but can comb his hair UB Dressing: Min A  LB Dressing: Dep  Toileting:  Mod  Bathing: Mod  Tub Shower transfers: Min A  Equipment: Emergency planning/management officer, Grab bars, and  Feeding equipment  IADLs: completely dependent for years now, except some ability to use microwave which is now not possible after hospitalization   MOBILITY STATUS: Needs Assist: CGA - Min A, Hx of falls, Festination, difficulty with turns, and neglect on Lt side  POSTURE COMMENTS:  rounded shoulders, forward head, and weight shift left  ACTIVITY TOLERANCE: Activity tolerance: he became tired and somewhat agitated by the end of the OT evaluation today (after PT and OT about 2.5hours with mostly sedate activities in seated positions)  FUNCTIONAL OUTCOME MEASURES: [facility] 17/30  showing problems with visuospatial/executive function, attention, fluency, and orientation. (at least 26/30 is needed to be considered "normal")  [employer] in ADLs: 1/6  (very dependent)  UPPER EXTREMITY ROM:    Active ROM Right eval Left eval  Shoulder scaption 100 85  Shoulder abduction    Shoulder adduction    Shoulder extension    Shoulder internal rotation Reaches small of back Reaches to hip  Shoulder external rotation Reaches back of head  Reaches up to ear  Elbow flexion  150 130  Elbow extension (-10*)  (-35*)  Wrist flexion 35 20  Wrist extension 50 10  (Blank rows = not tested)  UPPER EXTREMITY MMT:    Tested grossly as "push" and "pull": UE push 4-/5, UE pull 4/5  HAND FUNCTION: Grip strength: Right: 30 lbs; Left: 20 lbs  COORDINATION: Finger Nose Finger test: mildly ataxic but fairly accurate  Box and Blocks:  Right 11blocks, Left 5blocks  MUSCLE TONE:  Rt- normal, Lt- decreased and worsened by inattention/disuse  COGNITION: Overall cognitive status: Impaired and Difficulty to assess due to: severity of deficits; Poor attention, fluency, orientation, and executive function. Recall is fairly good as well as abstraction.  Seems to have personality changes due to vascular dementia- like an OCD type volitional perseveration of tapping with Rt hand that he can somewhat control.  He did become angry and agitated at the end of the session performing car transfer with spouse: he did shout at her.  VISION: Subjective report: no new changes per pt/spouse, uses "cheaters"   VISION ASSESSMENT: Visual fields seem intact though he had latent responses on the left side showing likely inattention/and/neglect, tracking somewhat delayed horizontally and vertically  PERCEPTION: Impaired: Inattention/neglect: does not attend to left visual field and does not attend to left side of body, Body scheme: poor quality of recognition with shaving, etc., and Spatial orientation: decreased in print and in orientation, especially to Lt side  PRAXIS: Not tested specifically but no overt/stated issues using pen, wearing glasses ,etc.   OBSERVATIONS: Pt taps and flicks and does seem to volitionally perseverate on making a rhythm/noise with Rt hand. He states he is aware of it and chooses to do it.  He does also seem to have mild resting and intention tremor.    TODAY'S TREATMENT:     DATE:10/14/22- Pt reports neck pain, hotpack applied while pt was seated on mat x 15 mins while  performing low range functional reach with LUE to retrieve items from floor to place on mat then table, min facilitation/ v.c for amplitude.No adverse reactions to heat Closed chain shoulder flexion reach towards floor, min facilitation. Standing at sink stepping to right and left sides to target and functional reach with left and right UE's, mod v.c min facilitation, visual cues for upright posture and to reach to cabinet. Sitting at table to place pegs into pegboard with right and left UE, mos-max difficulty with LUE, min v.c  Pt's wife brought in 1 pad however constant therapy was not compatible with Ipad.  10/12/22- Pt's wife reports pt. has continued neck pain today.Supine hotpack applied to neck x approximately 10 mins while therapist performed gentle stretch to shoulders in retraction and depression followed by pt performing closed chain shoulder flexion and chest press, min-mod facilitation, v.c Sitting pt doffed his flannel shirt with supervision and min v.c  Standing at sink, functional reaching laterally and with trunk rotation with right and left UE's, v.c for visual target for upright posture followed by removing dowel pegs from pegboard with LUE, then replacing with RUE, min-mod v.c and supervision for balance. Discussion with pt. And wife regarding plans to potentially discharge from OT or decrease frequency at the end of May due to pt progress plateauing. Therapist to provide pt's wife with information regarding Senior programs in the future. Pt's wife also asked about activities to promote cognition.  Pt may have access to an I pad in the future and therapist suggested constant therapy or other cognitive games on I pad.  10/07/22 Pt reports neck and shoulder pain today. It appears related to posture and left shoulder hike. Supine hotpack applied to neck x approximately 10 mins while therapist performed gentle stretch to shoulders in retraction and depression followed by pt performing closed  chain shoulder flexion and chest press, mod facilitation Seated trunk and head rotation tap wall with pole on left side for left neck rotation. Verbal cues for upright posture, mirror and visual target uses. Self stretch closed chain reaching for floor and chest press, mod v.c and min-mod facilitation Placing pegs into pegboard with RUE then removing with LUE, min v.c  10/05/22- Pt reports neck and shoulder pain today. It appears related to posture and left shoulder hike. Supine hotpack applied to neck x approximately 10 mins while pt performed closed chain shoulder flexion and chest press. Mod facilitation. Functional reaching with left and right UE's to retrieve and replace beanbags. Standing with hands on fitter poles pt rocked forwards and backwards with shoulder flexion then retraction mod v.c and min facilitation. Attempted weight shifts side to side, max v.c and target for weightshifts. Seated tapping boomwhakers to target , mod v.c  for shoulder flexion and functional reach to targets. Pt practiced opening coffee container several times, accurately, in standing and seated pt's wife reports pt sometimes has difficulty at home. Opening the newspaper with min -mod v.c for LUE functional use.   PATIENT EDUCATION: Education details: See treatment section above for details,  Person educated: Patient and Spouse Education method: Explanation, Demonstration, Tactile cues, and Verbal cues Education comprehension: verbalized understanding, returned demonstration, verbal cues required, tactile cues required, and needs further education  HOME EXERCISE PROGRAM: Access Code: [email or web address] URL: [email or web address] Date: 07/21/2022 Prepared by: Fannie Knee Exercises - Seated Shoulder Flexion AAROM with Dowel  - 4-6 x daily - 1 sets - 10-15 reps - Seated Shoulder Abduction AAROM with Dowel  - 4-6 x daily - 1 sets - 10-15 reps - Seated Shoulder External Rotation AAROM with Dowel  - 4-6 x  daily - 1 sets - 10-15 reps - Seated Bicep Curls with Bar  - 4-6 x daily - 1 sets - 10-15 reps - Seated Shoulder Row with Anchored Resistance  - 4-6 x daily - 1 sets - 10-15 reps     GOALS: Goals reviewed with patient? Yes   SHORT TERM GOALS: (STG required if POC>30 days) Target Date: 09/23/22   Pt will demo/state understanding of  initial HEP (or home activity suggestions to maintain function as appropriate) to improve functional skills. Goal status: 07/28/22- Met (addressed in past 2 sessions) 2. Pt will donn a t-shirt consistently with min A and v.c   Goal status: met , per wife's report 3. Pt will demonstrate ability to open the newspaper  with supervision min v.c Goal status:  ongoing, inconsistent, min , mod v.c at times 10/05/22 4.  Pt will consistently sustain focus to a simple functional task  in a minimally distracting environment x 6 mins with no more than 1 re-direct(ie: eating, functional task in clinic)  Goal status:  improved but inconsistent  however pt has been able to sutain attention to several functional tasks in clinic  x 6 mins several times recently 10/05/22  5. Pt will consistently open containers with only supervision and min v.c           Goal status: partially met,  opens most containers at this level  in the clinic, however sometimes has difficulty with coffee containers at home sometimes, per wife's report. 10/05/22    LONG TERM GOALS: Target Date: 11/16/22  Pt will improve functional ability by decreased impairment per Trinna Balloon assessment from 1/6 to 2/6 or better, for better quality of life. Goal status:  not met 1/6  2.  Caregiver will state his grooming ability/hygiene ability improved from moderate assist to supervision assist. Goal status: not met-Pt continues to require mod A.  3.  He will show better functional use of left arm by improved box and blocks Test score from 5 blocks to at least 10 blocks. Goal status:not met but improved 8  blocks  4.  Pt will show ability to microwave instant coffee or small meal safely, to help decrease caregiver burden. Goal status: partially met, pt is able to microwave coffee with supervision, however occasional min A to open containers 5.  He will show improved cognitive ability enough to play a simple game with OT and caregiver. Goal status: approximating goal, pt has played a game of uno at home with family.  6.  Caregiver will state at least 4 ways to decrease perseveration with right arm, also stating that this behaviors at least somewhat better in the home now. Goal status: deferred perseverative behaviors have improved.  7. Pt will donn a flannel button front shirt consistently with min A and min v.c Baseline: mod-max Goal status:  ongoing, inconsitent min-mod A, min-mod v.c 09/15/22, pt doffed today with supervision and v.c 10/12/22 8.  Pt will use his LUE as an active assist for ADLS grossly 40% of the time with min v.c Baseline: uses 25% Goal status:  ongoing inconsistent use of RUE 10/12/22  9 Pt will stand at the countertop and perform functional activities  incorporating bilateral UE's with supervision and min v.c  Goal status:  ongoing, min assist at time-09/15/22 10. Pt will back up to a chair and sit down squarely at least 1/3 of the time with min v.c  Goal status: ongoing , performs in clinic with min-mod v.c 09/15/22 11..  Pt will open the newspaper and locate sports section with no more than min v.c                          Goal status:  ongoing, min-mod A at times 09/15/22  ASSESSMENT:  CLINICAL IMPRESSION: Pt is progressing slowly towards goals. Pt participated well in today's session and he reports improved neck and  shoulder pain at end of session. Improved LUE functional use today.  OT Frequency/DURATION: 2x week x 12 weeks however anticipate d/c after 8 weeks dependent on progress  PLANNED INTERVENTIONS: self care/ADL training, therapeutic exercise, therapeutic activity,  neuromuscular re-education, manual therapy, balance training, functional mobility training, moist heat, cryotherapy, patient/family education, cognitive remediation/compensation, visual/perceptual remediation/compensation, psychosocial skills training, energy conservation, coping strategies training, DME and/or AE instructions, and Re-evaluation  RECOMMENDED OTHER SERVICES: He has already seen PT for balance and functional mobility, he would probably benefit from speech for cognitive issues as well, though OT will be working with cognition somewhat functionally now.  CONSULTED AND AGREED WITH PLAN OF CARE: Patient and family member/caregiver  PLAN FOR NEXT SESSION:    Work towards goals, dynamic step and reach, continue to  monitor neck pain, ADLS, LUE functional use    Mohd Clemons, OTR/L 3:10 PM 10/14/22

## 2022-10-15 ENCOUNTER — Telehealth: Payer: Self-pay

## 2022-10-15 NOTE — Patient Outreach (Signed)
  Care Coordination   Initial Visit Note   10/15/2022 Name: William [name] MRN: [id number] DOB: August 17, 1940  William [name] is a 82 y.o. year old male who sees [name], [name], DO for primary care. I spoke with spouse(dpr) by phone today.  What matters to the patients health and wellness today?  RNCM called to discuss care coordination program and to assess care coordination needs. Spoke with wife who declines to complete HIPAA verification. She expresses patient has had care coordinator in the past when he was in the rehab facility and after he got home and denies a need now. Ms. Richendollar states she will contact primary care provider if care coordination services needed in the future.   Goals Addressed             This Visit's Progress    COMPLETED: Care Coordination Activities       Interventions Today    Flowsheet Row Most Recent Value  General Interventions   General Interventions Discussed/Reviewed General Interventions Discussed  [discussed care coordination services. offered to provide [facility] direct contact number. RNCM encouraged wife to contact primary care provider if care coordination needs in the future.]            SDOH assessments and interventions completed:  No  Care Coordination Interventions:  Yes, provided   Follow up plan: No further intervention required.   Encounter Outcome:  Pt. Visit Completed   Kathyrn Sheriff, RN, MSN, BSN, CCM [facility] Care Coordinator [phone]

## 2022-10-17 ENCOUNTER — Other Ambulatory Visit: Payer: Self-pay | Admitting: Family Medicine

## 2022-10-18 ENCOUNTER — Other Ambulatory Visit (HOSPITAL_BASED_OUTPATIENT_CLINIC_OR_DEPARTMENT_OTHER): Payer: Self-pay

## 2022-10-18 ENCOUNTER — Other Ambulatory Visit: Payer: Self-pay

## 2022-10-18 ENCOUNTER — Ambulatory Visit: Payer: PPO | Admitting: Occupational Therapy

## 2022-10-18 DIAGNOSIS — R41840 Attention and concentration deficit: Secondary | ICD-10-CM

## 2022-10-18 DIAGNOSIS — I69354 Hemiplegia and hemiparesis following cerebral infarction affecting left non-dominant side: Secondary | ICD-10-CM

## 2022-10-18 DIAGNOSIS — M6281 Muscle weakness (generalized): Secondary | ICD-10-CM

## 2022-10-18 DIAGNOSIS — I69318 Other symptoms and signs involving cognitive functions following cerebral infarction: Secondary | ICD-10-CM

## 2022-10-18 DIAGNOSIS — R278 Other lack of coordination: Secondary | ICD-10-CM

## 2022-10-18 DIAGNOSIS — M25512 Pain in left shoulder: Secondary | ICD-10-CM

## 2022-10-18 DIAGNOSIS — R2689 Other abnormalities of gait and mobility: Secondary | ICD-10-CM

## 2022-10-18 MED ORDER — TAMSULOSIN HCL 0.4 MG PO CAPS
0.4000 mg | ORAL_CAPSULE | Freq: Every day | ORAL | 1 refills | Status: DC
  Filled 2022-10-18: qty 90, 90d supply, fill #0
  Filled 2023-01-02: qty 90, 90d supply, fill #1

## 2022-10-18 NOTE — Therapy (Signed)
OUTPATIENT OCCUPATIONAL THERAPY TREATMENT NOTE  Patient Name: William [name] MRN: [id number] DOB:03-07-1941, 82 y.o., male Today's Date: 10/18/2022  PCP & REFERRING PROVIDER:  [name], [name], DO    END OF SESSION:  OT End of Session - 10/18/22 1611     Visit Number 24    Number of Visits 32    Date for OT Re-Evaluation 11/16/22    Authorization Type [employer]    Authorization - Visit Number 24    Progress Note Due on Visit 30    OT Start Time 1445    OT Stop Time 1530    OT Time Calculation (min) 45 min    Activity Tolerance Patient tolerated treatment well    Behavior During Therapy [facility] for tasks assessed/performed                           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   HTN (hypertension) 07/19/2015   Hyperlipidemia    Major neurocognitive disorder 01/09/2014   Mild, related to stroke history   Nocturia    Renal insufficiency 06/25/2013   S/P carotid endarterectomy    BILATERAL ICA--  PATENT PER DUPLEX  05-19-2012   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/26/2017   Right calf   Urinary frequency    Vitamin D deficiency    Past Surgical History:  Procedure Laterality Date   APPENDECTOMY  AS CHILD   CARDIOVASCULAR STRESS TEST  03-27-2012  DR CRENSHAW   LOW RISK LEXISCAN STUDY-- PROBABLE NORMAL PERFUSION AND SOFT TISSUE ATTENUATION/  NO ISCHEMIA/ EF 51%   CAROTID ENDARTERECTOMY Bilateral LEFT  11-12-2008  DR GREG HAYES   RIGHT ICA  2006  ([facility])   CHOLECYSTECTOMY N/A 02/23/2022   Procedure: LAPAROSCOPIC  CHOLECYSTECTOMY;  Surgeon: Quentin Ore, MD;  Location: [facility] OR;  Service: General;  Laterality: N/A;   CYSTOSCOPY W/ RETROGRADES Bilateral 06/22/2021   Procedure: CYSTOSCOPY WITH RETROGRADE PYELOGRAM;  Surgeon: Marcine Matar, MD;  Location: [facility];  Service: Urology;  Laterality: Bilateral;   CYSTOSCOPY WITH LITHOLAPAXY N/A 02/26/2013   Procedure: CYSTOSCOPY WITH LITHOLAPAXY;  Surgeon: Marcine Matar, MD;  Location: [facility];  Service: Urology;  Laterality: N/A;   ENDOSCOPIC RETROGRADE CHOLANGIOPANCREATOGRAPHY (ERCP) WITH PROPOFOL N/A 02/22/2022   Procedure: ENDOSCOPIC RETROGRADE CHOLANGIOPANCREATOGRAPHY (ERCP) WITH PROPOFOL;  Surgeon: Jeani Hawking, MD;  Location: [facility] ENDOSCOPY;  Service: Gastroenterology;  Laterality: N/A;   EYE SURGERY  Jan. 2016   cataract surgery both eyes   INGUINAL HERNIA REPAIR Right 11-08-2006   IR KYPHO EA ADDL LEVEL THORACIC OR LUMBAR  02/12/2021   IR RADIOLOGIST EVAL & MGMT  02/18/2021   MASS EXCISION N/A 03/03/2016   Procedure: EXCISION OF BACK  MASS;  Surgeon: Almond Lint, MD;  Location: [facility];  Service: General;  Laterality: N/A;   MOHS SURGERY Left 1/ 2016   Dr Margo Aye-- Basal cell   PROSTATE SURGERY     REMOVAL  OF STONES  02/22/2022   Procedure: REMOVAL OF STONES;  Surgeon: Jeani Hawking, MD;  Location: [facility] ENDOSCOPY;  Service: Gastroenterology;;   Dennison Mascot  02/22/2022   Procedure: Dennison Mascot;  Surgeon: Jeani Hawking, MD;  Location: [facility] ENDOSCOPY;  Service: Gastroenterology;;   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-C N/A 06/22/2021   Procedure: TRANSURETHRAL RESECTION OF BLADDER TUMOR;  Surgeon: Marcine Matar, MD;  Location: [facility];  Service: Urology;  Laterality: N/A;   TRANSURETHRAL RESECTION OF PROSTATE N/A 02/26/2013   Procedure: TRANSURETHRAL RESECTION OF THE PROSTATE WITH GYRUS INSTRUMENTS;  Surgeon: Marcine Matar, MD;  Location: [facility];  Service: Urology;  Laterality: N/A;   TRANSURETHRAL RESECTION OF PROSTATE N/A 06/22/2021   Procedure: TRANSURETHRAL RESECTION OF THE PROSTATE (TURP);  Surgeon: Marcine Matar, MD;  [facility];  Service: Urology;  Laterality: N/A;   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   Insomnia    Prediabetes    Acute 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 (HCC)    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   Transaminitis 07/27/2021   UTI (urinary tract infection) 07/27/2021   CVA (cerebral vascular accident) (HCC) 07/27/2021   Fall 07/27/2021   Hyperglycemia 07/27/2021   Cholelithiasis 07/27/2021   Hypoxia 07/27/2021   Nausea and vomiting 07/27/2021   Acute metabolic encephalopathy 07/27/2021   Normocytic anemia 07/27/2021   Chronic back pain 07/27/2021   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   Closed fracture of first lumbar vertebra with routine healing 02/03/2021   Closed fracture of multiple ribs 11/18/2020   Anxiety 01/29/2020   Leg pain, bilateral 01/29/2020   Ingrown toenail 07/13/2019   Lumbar spondylosis 05/02/2018   Pain in left knee 03/09/2018   Osteoarthritis of left hip 01/16/2018   Trochanteric bursitis of left hip 01/16/2018   Preventative health care 09/26/2017   HTN (hypertension) 07/19/2015    Hyperlipidemia 07/19/2015   Great toe pain 02/11/2014   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   Obesity (BMI 30-39.9) 06/25/2013   Renal insufficiency 06/25/2013   Weakness of left arm 06/25/2013   Sebaceous cyst 03/03/2011   Sprain of lumbar region 07/31/2010   Rib pain, left 08/29/2009   Carotid artery stenosis, asymptomatic, bilateral 05/02/2009   Eczema, atopic 05/31/2008   Vitamin D deficiency 03/01/2008   BPH (benign prostatic hyperplasia) 08/06/2007   Fasting hyperglycemia 12/21/2006   History of right MCA infarct 2006    ONSET DATE: Acute exacerbation of chronic symptoms (most recent hospitalization 02/26/22 - 03/12/22, followed by SNF and home health)   REFERRING DIAG:  I63.9 (ICD-10-CM) - Cerebrovascular accident (CVA), unspecified mechanism (HCC)  W19.XXXD (ICD-10-CM) - Fall, subsequent encounter    THERAPY DIAG:  Muscle weakness (generalized)  Hemiplegia and hemiparesis following cerebral infarction affecting left non-dominant side (HCC)  Other lack of coordination  Attention and concentration deficit  Other symptoms and signs  involving cognitive functions following cerebral infarction  Acute pain of left shoulder  Other abnormalities of gait and mobility  Rationale for Evaluation and Treatment: Rehabilitation  PERTINENT HISTORY: PMHx includes L basal ganglia and corona radiata infarct 07/27/21, L lower homonymous quadrantanopia; R MCA infarct in 2006 w/ L-sided hemiparesis, NCD, carotid artery disease (bilateral), HTN, HLD,CKD3, vascular dementia, and hx of bladder cancer. He has had falls in 2023, fracturing his clavicle in August, then he had stomach surgery in Sept 2023. He has He needs assist at baseline for ADLs. When last documented in acute rehab after having his gall-bladder removed, (03/09/22) he required moderate to maximum assist x2 for most ADLs (more assist for ADLs the require standing), and they recommended rehab in skilled nursing setting.  He apparently got Covid at some point and also attended a SNF. Once he D/C home, he had some episodes of combativeness at night  with "sun-downing."  He is communicative and appropriate, though appears easily confused and distracted, not fully oriented to time and place (Ax2, states date is 97 and cannot say what city he lives in). His wife provides most details, and he is participative, though a poor historian at times. In a nut shell- he has been suffering from dementia and post-stroke effects for years, and his issues were recently exacerbated in Sept of last year after abdominal surgery. He has less use of Lt "stroke side" and new, ritualistic "ticks" in mouth and Rt hand/arm.  Wife states that they do have a caregiver come M, W, F for 4 hours in the middle of the day to mainly help him bathe and dress, which usually tires him out. He has been more combative towards the end of the day as well.   PRECAUTIONS: Fall and Other: Lt hemi, poor cognition, LATEX and adhesive allergies, etc.  ;WEIGHT BEARING RESTRICTIONS: No   SUBJECTIVE:   SUBJECTIVE STATEMENT: I'm ok  Pt accompanied by:  his wife   PAIN: no     FALLS: Has patient fallen in last 6 months? Yes. Number of falls at least 1 resulting in clavicle fx (now healed)   LIVING ENVIRONMENT: Lives with: lives with their spouse Lives in: House/apartment Has following equipment at home: manual w/c, 2 w/w, weighted utensils, shower bench (for tub), bed cane, hospital bed, raised toilet, and possibly more  PLOF: Needs assistance with ADLs, Needs assistance with homemaking, Needs assistance with gait, and Needs assistance with transfers  PATIENT GOALS: at eval- increase personal hygiene skills (brush teeth, shaving)  prepare instant coffee or microwave meal improve functional cognitive skills to play games, etc.,   increase Lt arm usage try to help decrease Rt hand/arm perseveration (flicking, tapping, noise making) Improve fnl tranfers  in/out of multiple seats (tub bench, swivel chair, etc.)    OBJECTIVE: (All objective assessments below are from initial evaluation on: 07/07/22 unless otherwise specified.)   HAND DOMINANCE: Right  ADLs: Overall ADLs: Wife states decreased ability since hospitalization in Sept 23.  Transfers/ambulation related to ADLs: Min A transfer, CGA for mobility Eating: set up Grooming: Mod A- needs assist with oral care, shaving, but can comb his hair UB Dressing: Min A  LB Dressing: Dep  Toileting:  Mod  Bathing: Mod  Tub Shower transfers: Min A  Equipment: Emergency planning/management officer, Grab bars, and Feeding equipment  IADLs: completely dependent for years now, except some ability to use microwave which is now not possible after hospitalization   MOBILITY STATUS: Needs Assist: CGA - Min A,  Hx of falls, Festination, difficulty with turns, and neglect on Lt side  POSTURE COMMENTS:  rounded shoulders, forward head, and weight shift left  ACTIVITY TOLERANCE: Activity tolerance: he became tired and somewhat agitated by the end of the OT evaluation today (after PT and OT about 2.5hours with mostly sedate activities in seated positions)  FUNCTIONAL OUTCOME MEASURES: [facility] 17/30  showing problems with visuospatial/executive function, attention, fluency, and orientation. (at least 26/30 is needed to be considered "normal")  [employer] in ADLs: 1/6  (very dependent)  UPPER EXTREMITY ROM:    Active ROM Right eval Left eval  Shoulder scaption 100 85  Shoulder abduction    Shoulder adduction    Shoulder extension    Shoulder internal rotation Reaches small of back Reaches to hip  Shoulder external rotation Reaches back of head  Reaches up to ear  Elbow flexion 150 130  Elbow extension (-10*)  (-35*)  Wrist flexion 35 20  Wrist extension 50 10  (Blank rows = not tested)  UPPER EXTREMITY MMT:    Tested grossly as "push" and "pull": UE push 4-/5, UE pull 4/5  HAND FUNCTION: Grip strength:  Right: 30 lbs; Left: 20 lbs  COORDINATION: Finger Nose Finger test: mildly ataxic but fairly accurate  Box and Blocks:  Right 11blocks, Left 5blocks  MUSCLE TONE:  Rt- normal, Lt- decreased and worsened by inattention/disuse  COGNITION: Overall cognitive status: Impaired and Difficulty to assess due to: severity of deficits; Poor attention, fluency, orientation, and executive function. Recall is fairly good as well as abstraction.  Seems to have personality changes due to vascular dementia- like an OCD type volitional perseveration of tapping with Rt hand that he can somewhat control.  He did become angry and agitated at the end of the session performing car transfer with spouse: he did shout at her.  VISION: Subjective report: no new changes per pt/spouse, uses "cheaters"   VISION ASSESSMENT: Visual fields seem intact though he had latent responses on the left side showing likely inattention/and/neglect, tracking somewhat delayed horizontally and vertically  PERCEPTION: Impaired: Inattention/neglect: does not attend to left visual field and does not attend to left side of body, Body scheme: poor quality of recognition with shaving, etc., and Spatial orientation: decreased in print and in orientation, especially to Lt side  PRAXIS: Not tested specifically but no overt/stated issues using pen, wearing glasses ,etc.   OBSERVATIONS: Pt taps and flicks and does seem to volitionally perseverate on making a rhythm/noise with Rt hand. He states he is aware of it and chooses to do it.  He does also seem to have mild resting and intention tremor.    TODAY'S TREATMENT:     DATE:10/18/22- Pt's wife uploaded constant therapy to pt's I-pad. Therapist assisted pt with navigating setup and initial tasks with mod v.c and demonstration. Pt performed activities requiring reading comprehension, matching itmes to wors, reading out load for speech volume. Pt required assistance and verbal and tactile cues for each  new task.  Pt demonstrated improved ability to sustain attention to task however due to impulsivity he sometimes clicks answers before fully reading option. Standing at sink dynamic step and reach to targets, mod v.c for amplitude  10/14/22- Pt reports neck pain, hotpack applied while pt was seated on mat x 15 mins while performing low range functional reach with LUE to retrieve items from floor to place on mat then table, min facilitation/ v.c for amplitude.No adverse reactions to heat Closed chain shoulder flexion reach towards  floor, min facilitation. Standing at sink stepping to right and left sides to target and functional reach with left and right UE's, mod v.c min facilitation, visual cues for upright posture and to reach to cabinet. Sitting at table to place pegs into pegboard with right and left UE, mos-max difficulty with LUE, min v.c Pt's wife brought in 1 pad however constant therapy was not compatible with Ipad.  10/12/22- Pt's wife reports pt. has continued neck pain today.Supine hotpack applied to neck x approximately 10 mins while therapist performed gentle stretch to shoulders in retraction and depression followed by pt performing closed chain shoulder flexion and chest press, min-mod facilitation, v.c Sitting pt doffed his flannel shirt with supervision and min v.c  Standing at sink, functional reaching laterally and with trunk rotation with right and left UE's, v.c for visual target for upright posture followed by removing dowel pegs from pegboard with LUE, then replacing with RUE, min-mod v.c and supervision for balance. Discussion with pt. And wife regarding plans to potentially discharge from OT or decrease frequency at the end of May due to pt progress plateauing. Therapist to provide pt's wife with information regarding Senior programs in the future. Pt's wife also asked about activities to promote cognition.  Pt may have access to an I pad in the future and therapist suggested  constant therapy or other cognitive games on I pad.  10/07/22 Pt reports neck and shoulder pain today. It appears related to posture and left shoulder hike. Supine hotpack applied to neck x approximately 10 mins while therapist performed gentle stretch to shoulders in retraction and depression followed by pt performing closed chain shoulder flexion and chest press, mod facilitation Seated trunk and head rotation tap wall with pole on left side for left neck rotation. Verbal cues for upright posture, mirror and visual target uses. Self stretch closed chain reaching for floor and chest press, mod v.c and min-mod facilitation Placing pegs into pegboard with RUE then removing with LUE, min v.c  10/05/22- Pt reports neck and shoulder pain today. It appears related to posture and left shoulder hike. Supine hotpack applied to neck x approximately 10 mins while pt performed closed chain shoulder flexion and chest press. Mod facilitation. Functional reaching with left and right UE's to retrieve and replace beanbags. Standing with hands on fitter poles pt rocked forwards and backwards with shoulder flexion then retraction mod v.c and min facilitation. Attempted weight shifts side to side, max v.c and target for weightshifts. Seated tapping boomwhakers to target , mod v.c  for shoulder flexion and functional reach to targets. Pt practiced opening coffee container several times, accurately, in standing and seated pt's wife reports pt sometimes has difficulty at home. Opening the newspaper with min -mod v.c for LUE functional use.   PATIENT EDUCATION: Education details: See treatment section above for details,  Person educated: Patient and Spouse Education method: Explanation, Demonstration, Tactile cues, and Verbal cues Education comprehension: verbalized understanding, returned demonstration, verbal cues required, tactile cues required, and needs further education  HOME EXERCISE PROGRAM: Access Code:  [email or web address] URL: [email or web address] Date: 07/21/2022 Prepared by: Fannie Knee Exercises - Seated Shoulder Flexion AAROM with Dowel  - 4-6 x daily - 1 sets - 10-15 reps - Seated Shoulder Abduction AAROM with Dowel  - 4-6 x daily - 1 sets - 10-15 reps - Seated Shoulder External Rotation AAROM with Dowel  - 4-6 x daily - 1 sets - 10-15 reps - Seated Bicep Curls with Bar  - 4-6 x  daily - 1 sets - 10-15 reps - Seated Shoulder Row with Anchored Resistance  - 4-6 x daily - 1 sets - 10-15 reps     GOALS: Goals reviewed with patient? Yes   SHORT TERM GOALS: (STG required if POC>30 days) Target Date: 09/23/22   Pt will demo/state understanding of initial HEP (or home activity suggestions to maintain function as appropriate) to improve functional skills. Goal status: 07/28/22- Met (addressed in past 2 sessions) 2. Pt will donn a t-shirt consistently with min A and v.c   Goal status: met , per wife's report 3. Pt will demonstrate ability to open the newspaper  with supervision min v.c Goal status:  ongoing, inconsistent, min , mod v.c at times 10/05/22 4.  Pt will consistently sustain focus to a simple functional task  in a minimally distracting environment x 6 mins with no more than 1 re-direct(ie: eating, functional task in clinic)  Goal status:  improved but inconsistent  however pt has been able to sutain attention to several functional tasks in clinic  x 6 mins several times recently 10/05/22  5. Pt will consistently open containers with only supervision and min v.c           Goal status: partially met,  opens most containers at this level  in the clinic, however sometimes has difficulty with coffee containers at home sometimes, per wife's report. 10/05/22    LONG TERM GOALS: Target Date: 11/16/22  Pt will improve functional ability by decreased impairment per Trinna Balloon assessment from 1/6 to 2/6 or better, for better quality of life. Goal status:  not met  1/6  2.  Caregiver will state his grooming ability/hygiene ability improved from moderate assist to supervision assist. Goal status: not met-Pt continues to require mod A.  3.  He will show better functional use of left arm by improved box and blocks Test score from 5 blocks to at least 10 blocks. Goal status:not met but improved 8 blocks  4.  Pt will show ability to microwave instant coffee or small meal safely, to help decrease caregiver burden. Goal status: partially met, pt is able to microwave coffee with supervision, however occasional min A to open containers 5.  He will show improved cognitive ability enough to play a simple game with OT and caregiver. Goal status: approximating goal, pt has played a game of uno at home with family.  6.  Caregiver will state at least 4 ways to decrease perseveration with right arm, also stating that this behaviors at least somewhat better in the home now. Goal status: deferred perseverative behaviors have improved.  7. Pt will donn a flannel button front shirt consistently with min A and min v.c Baseline: mod-max Goal status:  ongoing, inconsitent min-mod A, min-mod v.c 09/15/22, pt doffed today with supervision and v.c 10/12/22 8.  Pt will use his LUE as an active assist for ADLS grossly 40% of the time with min v.c Baseline: uses 25% Goal status:  ongoing inconsistent use of RUE 10/12/22  9 Pt will stand at the countertop and perform functional activities  incorporating bilateral UE's with supervision and min v.c  Goal status:  ongoing, min assist at time-09/15/22 10. Pt will back up to a chair and sit down squarely at least 1/3 of the time with min v.c  Goal status: ongoing , performs in clinic with min-mod v.c 09/15/22 11..  Pt will open the newspaper and locate sports section with no more than min v.c  Goal status:  ongoing, min-mod A at times 09/15/22  ASSESSMENT:  CLINICAL IMPRESSION: Pt is progressing towards goals. Pt  participated well in today's sessio. He demonstrates improved attention to tasks on constant therapy however made errors due to impulsivity. OT Frequency/DURATION: 2x week x 12 weeks however anticipate d/c after 8 weeks dependent on progress  PLANNED INTERVENTIONS: self care/ADL training, therapeutic exercise, therapeutic activity, neuromuscular re-education, manual therapy, balance training, functional mobility training, moist heat, cryotherapy, patient/family education, cognitive remediation/compensation, visual/perceptual remediation/compensation, psychosocial skills training, energy conservation, coping strategies training, DME and/or AE instructions, and Re-evaluation  RECOMMENDED OTHER SERVICES: He has already seen PT for balance and functional mobility, he would probably benefit from speech for cognitive issues as well, though OT will be working with cognition somewhat functionally now.  CONSULTED AND AGREED WITH PLAN OF CARE: Patient and family member/caregiver  PLAN FOR NEXT SESSION:    Work towards goals, dynamic step and reach, continue to  monitor neck pain, ADLS, LUE functional use    Keene Breath, OTR/L 4:12 PM 10/18/22

## 2022-10-19 ENCOUNTER — Encounter: Payer: Self-pay | Admitting: Physical Therapy

## 2022-10-19 ENCOUNTER — Ambulatory Visit: Payer: PPO | Admitting: Physical Therapy

## 2022-10-19 DIAGNOSIS — I69354 Hemiplegia and hemiparesis following cerebral infarction affecting left non-dominant side: Secondary | ICD-10-CM

## 2022-10-19 DIAGNOSIS — M6281 Muscle weakness (generalized): Secondary | ICD-10-CM | POA: Diagnosis not present

## 2022-10-19 DIAGNOSIS — R296 Repeated falls: Secondary | ICD-10-CM

## 2022-10-19 DIAGNOSIS — R262 Difficulty in walking, not elsewhere classified: Secondary | ICD-10-CM

## 2022-10-19 DIAGNOSIS — R278 Other lack of coordination: Secondary | ICD-10-CM

## 2022-10-19 NOTE — Therapy (Signed)
OUTPATIENT PHYSICAL THERAPY NEURO Treatment   Patient Name: William [name] MRN: [id number] DOB:05-04-1941, 82 y.o., male Today's Date: 10/19/2022  PCP: Donato Schultz  DO REFERRING PROVIDER: same  END OF SESSION:  PT End of Session - 10/19/22 1509     Visit Number 29    Date for PT Re-Evaluation 10/30/22    PT Start Time 1449    PT Stop Time 1530    PT Time Calculation (min) 41 min    Equipment Utilized During Treatment Gait belt    Activity Tolerance Patient tolerated treatment well    Behavior During Therapy WFL for tasks assessed/performed                 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   HTN (hypertension) 07/19/2015   Hyperlipidemia    Major neurocognitive disorder 01/09/2014   Mild, related to stroke history   Nocturia    Renal insufficiency 06/25/2013   S/P carotid endarterectomy    BILATERAL ICA--  PATENT PER DUPLEX  05-19-2012   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/26/2017   Right calf   Urinary frequency    Vitamin D deficiency    Past Surgical History:  Procedure Laterality Date   APPENDECTOMY  AS CHILD   CARDIOVASCULAR STRESS TEST  03-27-2012  DR CRENSHAW   LOW RISK LEXISCAN STUDY-- PROBABLE NORMAL PERFUSION AND SOFT TISSUE ATTENUATION/  NO ISCHEMIA/ EF 51%   CAROTID ENDARTERECTOMY Bilateral LEFT  11-12-2008  DR GREG HAYES   RIGHT ICA  2006  ([facility])   CHOLECYSTECTOMY N/A 02/23/2022   Procedure: LAPAROSCOPIC CHOLECYSTECTOMY;  Surgeon: Quentin Ore, MD;  Location: [facility] OR;  Service: General;  Laterality: N/A;    CYSTOSCOPY W/ RETROGRADES Bilateral 06/22/2021   Procedure: CYSTOSCOPY WITH RETROGRADE PYELOGRAM;  Surgeon: Marcine Matar, MD;  Location: [facility] [place];  Service: Urology;  Laterality: Bilateral;   CYSTOSCOPY WITH LITHOLAPAXY N/A 02/26/2013   Procedure: CYSTOSCOPY WITH LITHOLAPAXY;  Surgeon: Marcine Matar, MD;  Location: [facility];  Service: Urology;  Laterality: N/A;   ENDOSCOPIC RETROGRADE CHOLANGIOPANCREATOGRAPHY (ERCP) WITH PROPOFOL N/A 02/22/2022   Procedure: ENDOSCOPIC RETROGRADE CHOLANGIOPANCREATOGRAPHY (ERCP) WITH PROPOFOL;  Surgeon: Jeani Hawking, MD;  Location: [facility] ENDOSCOPY;  Service: Gastroenterology;  Laterality: N/A;   EYE SURGERY  Jan. 2016   cataract surgery both eyes   INGUINAL HERNIA REPAIR Right 11-08-2006   IR KYPHO EA ADDL LEVEL THORACIC OR LUMBAR  02/12/2021   IR RADIOLOGIST EVAL & MGMT  02/18/2021   MASS EXCISION N/A 03/03/2016   Procedure: EXCISION OF BACK  MASS;  Surgeon: Almond Lint, MD;  Location:  SURGERY CENTER;  Service: General;  Laterality: N/A;   MOHS SURGERY Left 1/ 2016   Dr Margo Aye-- Basal cell   PROSTATE SURGERY     REMOVAL OF STONES  02/22/2022   Procedure: REMOVAL OF STONES;  Surgeon: Jeani Hawking, MD;  Location: [facility] ENDOSCOPY;  Service: Gastroenterology;;   Dennison Mascot  02/22/2022   Procedure: Dennison Mascot;  Surgeon: Elnoria Howard,  Luisa Hart, MD;  Location: [facility] ENDOSCOPY;  Service: Gastroenterology;;   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-C N/A 06/22/2021   Procedure: TRANSURETHRAL RESECTION OF BLADDER TUMOR;  Surgeon: Marcine Matar, MD;  Location: [facility];  Service: Urology;  Laterality: N/A;   TRANSURETHRAL RESECTION OF PROSTATE N/A 02/26/2013   Procedure: TRANSURETHRAL RESECTION OF THE PROSTATE WITH GYRUS INSTRUMENTS;  Surgeon: Marcine Matar, MD;  Location: [facility];  Service: Urology;  Laterality: N/A;   TRANSURETHRAL RESECTION OF PROSTATE N/A 06/22/2021    Procedure: TRANSURETHRAL RESECTION OF THE PROSTATE (TURP);  Surgeon: Marcine Matar, MD;  Location: [facility];  Service: Urology;  Laterality: N/A;   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   Insomnia    Prediabetes    Acute 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 (HCC)    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   Transaminitis 07/27/2021   UTI (urinary tract infection) 07/27/2021   CVA (cerebral vascular accident) (HCC) 07/27/2021   Fall 07/27/2021   Hyperglycemia 07/27/2021   Cholelithiasis 07/27/2021   Hypoxia 07/27/2021   Nausea and vomiting 07/27/2021   Acute metabolic encephalopathy 07/27/2021   Normocytic anemia 07/27/2021   Chronic back pain 07/27/2021   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   Closed fracture of first lumbar vertebra with routine healing 02/03/2021   Closed fracture of multiple ribs 11/18/2020   Anxiety 01/29/2020   Leg pain, bilateral 01/29/2020   Ingrown toenail 07/13/2019   Lumbar spondylosis 05/02/2018   Pain in left knee 03/09/2018   Osteoarthritis of left hip 01/16/2018   Trochanteric bursitis of left hip 01/16/2018   Preventative health care 09/26/2017   HTN (hypertension) 07/19/2015   Hyperlipidemia 07/19/2015   Great toe pain 02/11/2014   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   Obesity  (BMI 30-39.9) 06/25/2013   Renal insufficiency 06/25/2013   Weakness of left arm 06/25/2013   Sebaceous cyst 03/03/2011   Sprain of lumbar region 07/31/2010   Rib pain, left 08/29/2009   Carotid artery stenosis, asymptomatic, bilateral 05/02/2009   Eczema, atopic 05/31/2008   Vitamin D deficiency 03/01/2008   BPH (benign prostatic hyperplasia) 08/06/2007   Fasting hyperglycemia 12/21/2006   History of right MCA infarct 2006    ONSET DATE: 06/29/22  REFERRING DIAG:  Diagnosis  I63.9 (ICD-10-CM) - Cerebrovascular accident (CVA), unspecified mechanism (HCC)  W19.XXXD (ICD-10-CM) - Fall, subsequent encounter    THERAPY DIAG:  Muscle weakness (generalized)  Hemiplegia and hemiparesis following cerebral infarction affecting left non-dominant side (HCC)  Other lack of coordination  Difficulty in walking, not elsewhere classified  Repeated falls  Rationale for Evaluation and Treatment: Rehabilitation  SUBJECTIVE:  SUBJECTIVE STATEMENT: wife reports that they messed up the appointment time so sometimes this messes him up, she reports that getting out of the car just now he had trouble standing. PERTINENT HISTORY:  AVIS KASSIS is a 82 year old man with dementia, CKD, HTN, CAD, HLD and history of CVA  (2006, 2021), Parkinson's  PAIN:  Are you having pain? No  PRECAUTIONS: Fall  WEIGHT BEARING RESTRICTIONS: No  FALLS: Has patient fallen in last 6 months? No  LIVING ENVIRONMENT: Lives with: lives with their family and lives with their spouse Lives in: House/apartment Stairs:  1 brick high step Has following equipment at home: Environmental consultant - 2 wheeled, Wheelchair (manual), Shower bench, bed side commode, Grab bars, and hospital bed, sliding pad for car  PLOF: Independent with basic ADLs and  Independent with household mobility without device  PATIENT GOALS: Walk around the block with his wife. Be able to use the dining room chair rather than have to use the W/C. Increased I with bed mobility and simple tasks at home like make some coffee or brush his teeth, Step over tub to shower, has bench, but he can't really use it. Up and down the one step to get to back deck.  OBJECTIVE:   DIAGNOSTIC FINDINGS:  MRI 2021 with degenerative changes in lumbar spine, L3-4 R subarticular stenosis  COGNITION: Overall cognitive status: History of cognitive impairments - at baseline   SENSATION: Not tested  COORDINATION: Moderately impaired BLE   MUSCLE LENGTH: Hamstrings: severely restricted B Thomas test: Severely restricted B.  POSTURE: rounded shoulders, forward head, increased thoracic kyphosis, posterior pelvic tilt, and flexed trunk   LOWER EXTREMITY ROM:   BLE extremely stiff throughout, limited hip ROM in all planes, B knees limited in extension.   LOWER EXTREMITY MMT:  3+/5 throughout. Unable to determine accuracy of MMT due to cognition. Functionally demosntrates poor coordinated activation and poor muscular endurance.   BED MOBILITY: Min A for Supine<> Sit. Patient was using a ladder in his hospital bed and could move MI.  TRANSFERS: Min A, mod VC, tends to lean back.  CURB: Min A-Has one step out to his deck.  STAIRS: N/A   GAIT: Gait pattern: step to pattern, decreased arm swing- Right, decreased arm swing- Left, decreased step length- Right, decreased step length- Left, shuffling, festinating, trunk flexed, narrow BOS, poor foot clearance- Right, and poor foot clearance- Left Distance walked: 77' Assistive device utilized: Environmental consultant - 2 wheeled Level of assistance: Occasional min A Comments: mod VC to stay close to walker, take longer steps.  FUNCTIONAL TESTS:  5 times sit to stand: 38.23  09/29/22  could not performs 5XSTS Timed up and go (TUG): 47.91  with RW,  07/21/22 TUG 29  seconds without device, 09/29/22 25 seconds  TODAY'S TREATMENT:  DATE:  10/19/22 Walk outside half way around parking [place] and a rest, then around our building back through the front door,  Volley ball sitting and standing, ball kicks sitting and standing In pbars marching and hip abduction 12" toe clears Tband on floor working on longer steps Sit to stand elevated surface cues needed Stairs step over step up and down Gait outside again cues and CGA  10/13/22 Walk outside with wife and patient, had wife do the HHA and the CGA, gave her instruction in safety and verbal cues as well as tactile cues HS curls 20# 3x10 Leg extension 5# 3x10 Leg press 20# 2x10 5# single arm pulls 4" step to his left working on picking leg up and clearing Gait again 4 x 100 feet with me more in front of him and him trying to put his feet where I am, this seemed to help with the step length  10/11/22 Walk outside HHA on the right, cues for step length 2 rests gait was in the grass, on asphalt and up/dwn curbs Nustep level 5 x 6 minutes Volleyball in sitting Gait inside 100 feet with the right hand HHA x 6 Side stepping, fwd and backward walking 2# marches 2# LAQ 2# hip flexion and abduction up onto a 6" step while sitting Sit to stand was difficult for him  10/06/22 STm to the left neck and upper trap area 2.5# LAQ 2.5# marches Red tband HS curls Red tband hip adduction Red tband hip abduction Red tband ankle exercises Nustep level 4 x 6 minutes Gait outside with HHA on the right 3x150'  09/29/22 Gait outside total 600 feet with 2 rests some walking in grass, uneven terrain See TUG, 5X STS above Worked on bed mobility and on our harder surfaces he is independent with in and out of bed and can position himself Volleyball  Side stepping, backward  walking  09/27/22 Nustep level 5 x 5 minutes Gait outside halfway around the parking [place] then rest, then back to this building and a rest outsie and then in the grass around to the front door with CGA/HHA on the right and some cues for step length Standing in Pbars marching and hip abduction 2x10 each Fwd/bkwd walking in the Pbars Seated volleyball Standing kicks Direction changes Seated 6" clears to practice in and out of car Gait back out to the car  09/22/2022 Nustep level 5 x 5 minutes Bike level 5 x 6 minutes Gait outside 1/2 parking [place] then sit and rest and then back around, right HHA and use of gait belt Standing straight arm pulls 5# one at a time Sit to stand with weighted ball and a higher level seating surface Standing left leg 4" toe clears Standing with right foot on 4" step for weight bearing and use of the left leg Standing and turning picking up cones and placing them other areas Folding laundry from one surface and turning and putting on another surface, needs some cues to use the left arm   09/20/22: NuStep L4 x 6 minutes Stair taps with opposite legs, BUE support, x 5 each, CGA Long steps across air ex planks placed horizontally, UUE support on rail of parallel bars, cues to face forward, support weight with R hand on bar.  Repeat with side stepping each direction Practiced sit to stand in parallel bars, emphasize forward weight shift, able to rise with CGA Moved to mat to continue forward weight shifts and sit to stand, CGA Ambulated x 70',  with CGA, x 3, in between sessions at ll bars.festinating gait . Seated forward trunk leans, with large therapeutic ball  2 sets 10 reps Seated forward rolls with ball , and press ups, partial standing , 2 sets 5 reps.  09/16/22 NuStep L4 x 6 minutes Functional re-assessment for PN-see goals Stair taps with opposite legs, BUE support, x 5 each, CGA Long steps across air ex planks placed horizontally, UUE support on rail  of parallel bars. Repeat with side stepping each direction Standing weight shifts on air ex pad, no UE support, CGA, reluctant to weight shift forward and back. Practiced sit to stand in parallel bars, emphasize forward weight shift, able to rise with CGA Moved to mat to continue forward weight shifts and sit to stand, CGA Ambulated x 50', improved upright posture and foot clearance.  09/14/22 NuStep L5 x11mins UBE L2 forwards and back  Volleyball hits sitting 2x10 each hand  Leg ext 5# 2x10 HS curls10# 2x10 Ball squeezes 2x10 Hip abduction with green band 2x10  09/07/22 Nustep level 5 x 6 minutes  Tmill  MPH CGA x 1 minute Gait outside around the parking [place] with one rest break Stairs step over step In pbars marching and hip abduction Walking forward and backward in pbars Gait outside in the grass  09/02/22 Nustep level 5 x 5 minutes Walking HHA outside halfway around the parking [place] and then rest on table, then back in 2.5# on the left ankle having him sit and raise foot up and abduct to a 6" step on the left Bed mobility rolling side to side [place], partial sit ups Walk 300 feet with HHA a lot of cues for the step length Ball kicks Volley ball standing and sitting  PATIENT EDUCATION: Education details: POC Person educated: Patient and Spouse Education method: Explanation Education comprehension: verbalized understanding  HOME EXERCISE PROGRAM: TBD  GOALS: Goals reviewed with patient? Yes  SHORT TERM GOALS: Target date: 07/19/22  I with initial HEP Baseline: Goal status: ongoing  2. Decrease 5 x STS to < 25 sec to demonstrate improved functional strength  Baseline: 38 sec  Goal status: 08/09/22 14 sec-met  3. Decrease TUG to < 35 sec to demonstrate improved functional balance  Baseline: 47 sec  Goal status: 15 seconds 09/29/22 met  4. Perform sit <> stand transfers from average chair height with S and VC to be able to use the swivel chair at his  dining table.  Baseline: Min A  Goal status: 09/16/22-Inconsistently performs  with S from average height. At times requires CGA to cue forward weight shift. ongoing  LONG TERM GOALS: Target date: 09/29/22  Patient will be able to step over his tub at home using grab bars for stability with CGA to access his shower. Baseline: Unable- has bench, but wife reports he steps over then sits. Goal status: 09/16/22-met  2.  Decrease 5 x STS to < 18 sec to demonstrate improved functional strength Baseline:  Goal status: not met 62 seconds and able to do only 3 09/29/22  3.  Decrease TUG to < 25 sec to demonstrate improved functional balance Baseline:  Goal status: 09/29/22 15 seconds  4.  Patient will perform his bed mobility with MI. Baseline: Min A Goal status: on hard surface mat he is independent but on soft bed at home he struggles  5.  Patient will ambulate at least 400' with LRAD, CGA, demonstrate improved upright posture, longer step length. Baseline:  Goal status: 09/16/22-Amb >400',  but began to fatigue after about 300' with more flexed posture, L foot shuffling more, unable to maintain safe technique. ongoing  6.  Patient will manage up and down a single step with LRAD, CGA to access his deck at home. Baseline: Unable Goal status: 09/16/22-Up and down 1 step with U rail. met  ASSESSMENT:  CLINICAL IMPRESSION: Patient had an issue getting out of car, wife reported that he got stuck and could not stand up, I worked some on standing from sitting and with an elevated surface and with cues he did well, he does tend to lean back, walking with right HHA at about chest level helps and today did not need much assist on the gait belt OBJECTIVE IMPAIRMENTS: Abnormal gait, decreased activity tolerance, decreased balance, decreased cognition, decreased coordination, decreased endurance, decreased mobility, difficulty walking, decreased ROM, decreased strength, decreased safety awareness, impaired  flexibility, impaired UE functional use, improper body mechanics, and postural dysfunction.   ACTIVITY LIMITATIONS: carrying, lifting, bending, sitting, standing, squatting, stairs, transfers, bed mobility, bathing, toileting, dressing, hygiene/grooming, and locomotion level  PARTICIPATION LIMITATIONS: meal prep, cleaning, laundry, interpersonal relationship, shopping, and community activity  PERSONAL FACTORS: Age, Past/current experiences, and 3+ comorbidities: CVA, Parkinson's traits, dementia  are also affecting patient's functional outcome.   REHAB POTENTIAL: Good  CLINICAL DECISION MAKING: Evolving/moderate complexity  EVALUATION COMPLEXITY: Moderate  PLAN:  PT FREQUENCY: 2x/week  PT DURATION: 12 weeks  PLANNED INTERVENTIONS: Therapeutic exercises, Therapeutic activity, Neuromuscular re-education, Balance training, Gait training, Patient/Family education, Self Care, Joint mobilization, Stair training, Moist heat, and Manual therapy  PLAN FOR NEXT SESSION:Continue to work on his balance and function Stacie Glaze, PT 10/19/22 3:14 PM

## 2022-10-20 ENCOUNTER — Other Ambulatory Visit (HOSPITAL_BASED_OUTPATIENT_CLINIC_OR_DEPARTMENT_OTHER): Payer: Self-pay

## 2022-10-20 ENCOUNTER — Ambulatory Visit: Payer: PPO | Admitting: Occupational Therapy

## 2022-10-20 DIAGNOSIS — M6281 Muscle weakness (generalized): Secondary | ICD-10-CM | POA: Diagnosis not present

## 2022-10-20 DIAGNOSIS — I69318 Other symptoms and signs involving cognitive functions following cerebral infarction: Secondary | ICD-10-CM

## 2022-10-20 DIAGNOSIS — M25512 Pain in left shoulder: Secondary | ICD-10-CM

## 2022-10-20 DIAGNOSIS — R2689 Other abnormalities of gait and mobility: Secondary | ICD-10-CM

## 2022-10-20 DIAGNOSIS — R41840 Attention and concentration deficit: Secondary | ICD-10-CM

## 2022-10-20 DIAGNOSIS — I69354 Hemiplegia and hemiparesis following cerebral infarction affecting left non-dominant side: Secondary | ICD-10-CM

## 2022-10-20 DIAGNOSIS — R278 Other lack of coordination: Secondary | ICD-10-CM

## 2022-10-20 NOTE — Therapy (Signed)
OUTPATIENT OCCUPATIONAL THERAPY TREATMENT NOTE  Patient Name: William [name] MRN: [id number] DOB:Sep 18, 1940, 82 y.o., male Today's Date: 10/20/2022  PCP & REFERRING PROVIDER:  [name], [name], DO    END OF SESSION:  OT End of Session - 10/20/22 1632     Visit Number 25    Number of Visits 32    Date for OT Re-Evaluation 11/16/22    Authorization Type [employer]    Authorization - Visit Number 25    Progress Note Due on Visit 30    OT Start Time 1447    OT Stop Time 1530    OT Time Calculation (min) 43 min                            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   HTN (hypertension) 07/19/2015   Hyperlipidemia    Major neurocognitive disorder 01/09/2014   Mild, related to stroke history   Nocturia    Renal insufficiency 06/25/2013   S/P carotid endarterectomy    BILATERAL ICA--  PATENT PER DUPLEX  05-19-2012   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   Right calf   Urinary frequency    Vitamin D deficiency    Past Surgical History:  Procedure Laterality Date   APPENDECTOMY  AS CHILD   CARDIOVASCULAR STRESS TEST  03-27-2012  DR CRENSHAW   LOW RISK LEXISCAN STUDY-- PROBABLE NORMAL PERFUSION AND SOFT TISSUE ATTENUATION/  NO ISCHEMIA/ EF 51%   CAROTID ENDARTERECTOMY Bilateral LEFT  11-12-2008  DR GREG HAYES   RIGHT ICA  2006  ([facility])   CHOLECYSTECTOMY N/A 02/23/2022   Procedure: LAPAROSCOPIC CHOLECYSTECTOMY;  Surgeon: Quentin Ore, MD;  Location: [facility] OR;  Service: General;  Laterality: N/A;    CYSTOSCOPY W/ RETROGRADES Bilateral 06/22/2021   Procedure: CYSTOSCOPY WITH RETROGRADE PYELOGRAM;  Surgeon: Marcine Matar, MD;  Location: [facility];  Service: Urology;  Laterality: Bilateral;   CYSTOSCOPY WITH LITHOLAPAXY N/A 02/26/2013   Procedure: CYSTOSCOPY WITH LITHOLAPAXY;  Surgeon: Marcine Matar, MD;  Location: [facility]  Service: Urology;  Laterality: N/A;   ENDOSCOPIC RETROGRADE CHOLANGIOPANCREATOGRAPHY (ERCP) WITH PROPOFOL N/A 02/22/2022   Procedure: ENDOSCOPIC RETROGRADE CHOLANGIOPANCREATOGRAPHY (ERCP) WITH PROPOFOL;  Surgeon: Jeani Hawking, MD;  Location: [facility] ENDOSCOPY;  Service: Gastroenterology;  Laterality: N/A;   EYE SURGERY  Jan. 2016   cataract surgery both eyes   INGUINAL HERNIA REPAIR Right 11-08-2006   IR KYPHO EA ADDL LEVEL THORACIC OR LUMBAR  02/12/2021   IR RADIOLOGIST EVAL & MGMT  02/18/2021   MASS EXCISION N/A 03/03/2016   Procedure: EXCISION OF BACK  MASS;  Surgeon: Almond Lint, MD;  Location: [facility];  Service: General;  Laterality: N/A;   MOHS SURGERY Left 1/ 2016   Dr Margo Aye-- Basal cell   PROSTATE SURGERY     REMOVAL OF STONES  02/22/2022   Procedure: REMOVAL OF STONES;  Surgeon: Jeani Hawking, MD;  Location: [facility]  ENDOSCOPY;  Service: Gastroenterology;;   Dennison Mascot  02/22/2022   Procedure: Dennison Mascot;  Surgeon: Jeani Hawking, MD;  Location: [facility] ENDOSCOPY;  Service: Gastroenterology;;   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-C N/A 06/22/2021   Procedure: TRANSURETHRAL RESECTION OF BLADDER TUMOR;  Surgeon: Marcine Matar, MD;  Location: [facility];  Service: Urology;  Laterality: N/A;   TRANSURETHRAL RESECTION OF PROSTATE N/A 02/26/2013   Procedure: TRANSURETHRAL RESECTION OF THE PROSTATE WITH GYRUS INSTRUMENTS;  Surgeon: Marcine Matar, MD;  Location: [facility];  Service: Urology;  Laterality: N/A;   TRANSURETHRAL RESECTION OF PROSTATE N/A 06/22/2021    Procedure: TRANSURETHRAL RESECTION OF THE PROSTATE (TURP);  Surgeon: Marcine Matar, MD;  Location: [facility];  Service: Urology;  Laterality: N/A;   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   Insomnia    Prediabetes    Acute 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 (HCC)    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   Transaminitis 07/27/2021   UTI (urinary tract infection) 07/27/2021   CVA (cerebral vascular accident) (HCC) 07/27/2021   Fall 07/27/2021   Hyperglycemia 07/27/2021   Cholelithiasis 07/27/2021   Hypoxia 07/27/2021   Nausea and vomiting 07/27/2021   Acute metabolic encephalopathy 07/27/2021   Normocytic anemia 07/27/2021   Chronic back pain 07/27/2021   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   Closed fracture of first lumbar vertebra with routine healing 02/03/2021   Closed fracture of multiple ribs 11/18/2020   Anxiety 01/29/2020   Leg pain, bilateral 01/29/2020   Ingrown toenail 07/13/2019   Lumbar spondylosis 05/02/2018   Pain in left knee 03/09/2018   Osteoarthritis of left hip 01/16/2018   Trochanteric bursitis of left hip 01/16/2018   Preventative health care 09/26/2017   HTN (hypertension) 07/19/2015   Hyperlipidemia 07/19/2015   Great toe pain 02/11/2014   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9 (ICD-10-CM) - Cerebrovascular accident (CVA), unspecified mechanism (HCC)  W19.XXXD (ICD-10-CM) - Fall, subsequent encounter    THERAPY DIAG:  Muscle weakness (generalized)  Hemiplegia and hemiparesis following cerebral infarction affecting left non-dominant side (HCC)  Other lack of coordination  Attention and concentration deficit  Other symptoms and signs involving cognitive functions following cerebral infarction  Acute pain of left shoulder  Other abnormalities of gait and  mobility  Rationale for Evaluation and Treatment: Rehabilitation  PERTINENT HISTORY: PMHx includes L basal ganglia and corona radiata infarct 07/27/21, L lower homonymous quadrantanopia; R MCA infarct in 2006 w/ L-sided hemiparesis, NCD, carotid artery disease (bilateral), HTN, HLD,CKD3, vascular dementia, and hx of bladder cancer. He has had falls in 2023, fracturing his clavicle in August, then he had stomach surgery in Sept 2023. He has He needs assist at baseline for ADLs. When last documented in acute rehab after having his gall-bladder removed, (03/09/22) he required moderate to maximum assist x2 for most ADLs (more assist for ADLs the require standing), and they recommended rehab in skilled nursing setting. He apparently got Covid at some point and also attended a SNF. Once he D/C home, he had some episodes of combativeness  at night  with "sun-downing."  He is communicative and appropriate, though appears easily confused and distracted, not fully oriented to time and place (Ax2, states date is 22 and cannot say what city he lives in). His wife provides most details, and he is participative, though a poor historian at times. In a nut shell- he has been suffering from dementia and post-stroke effects for years, and his issues were recently exacerbated in Sept of last year after abdominal surgery. He has less use of Lt "stroke side" and new, ritualistic "ticks" in mouth and Rt hand/arm.  Wife states that they do have a caregiver come M, W, F for 4 hours in the middle of the day to mainly help him bathe and dress, which usually tires him out. He has been more combative towards the end of the day as well.   PRECAUTIONS: Fall and Other: Lt hemi, poor cognition, LATEX and adhesive allergies, etc.  ;WEIGHT BEARING RESTRICTIONS: No   SUBJECTIVE:   SUBJECTIVE STATEMENT: Pt's wife reports she did not give pt his ativan today as she was fearful it would make him drowsy  Pt accompanied by:  his wife   PAIN: no     FALLS: Has patient fallen in last 6 months? Yes. Number of falls at least 1 resulting in clavicle fx (now healed)   LIVING ENVIRONMENT: Lives with: lives with their spouse Lives in: House/apartment Has following equipment at home: manual w/c, 2 w/w, weighted utensils, shower bench (for tub), bed cane, hospital bed, raised toilet, and possibly more  PLOF: Needs assistance with ADLs, Needs assistance with homemaking, Needs assistance with gait, and Needs assistance with transfers  PATIENT GOALS: at eval- increase personal hygiene skills (brush teeth, shaving)  prepare instant coffee or microwave meal improve functional cognitive skills to play games, etc.,   increase Lt arm usage try to help decrease Rt hand/arm perseveration (flicking, tapping, noise making) Improve fnl tranfers in/out of multiple seats  (tub bench, swivel chair, etc.)    OBJECTIVE: (All objective assessments below are from initial evaluation on: 07/07/22 unless otherwise specified.)   HAND DOMINANCE: Right  ADLs: Overall ADLs: Wife states decreased ability since hospitalization in Sept 23.  Transfers/ambulation related to ADLs: Min A transfer, CGA for mobility Eating: set up Grooming: Mod A- needs assist with oral care, shaving, but can comb his hair UB Dressing: Min A  LB Dressing: Dep  Toileting:  Mod  Bathing: Mod  Tub Shower transfers: Min A  Equipment: Emergency planning/management officer, Grab bars, and Feeding equipment  IADLs: completely dependent for years now, except some ability to use microwave which is now not possible after hospitalization   MOBILITY STATUS: Needs Assist: CGA - Min A,  Hx of falls, Festination, difficulty with turns, and neglect on Lt side  POSTURE COMMENTS:  rounded shoulders, forward head, and weight shift left  ACTIVITY TOLERANCE: Activity tolerance: he became tired and somewhat agitated by the end of the OT evaluation today (after PT and OT about 2.5hours with mostly sedate activities in seated positions)  FUNCTIONAL OUTCOME MEASURES: [facility] 17/30  showing problems with visuospatial/executive function, attention, fluency, and orientation. (at least 26/30 is needed to be considered "normal")  [employer] in ADLs: 1/6  (very dependent)  UPPER EXTREMITY ROM:    Active ROM Right eval Left eval  Shoulder scaption 100 85  Shoulder abduction    Shoulder adduction    Shoulder extension    Shoulder internal rotation Reaches small of back Reaches to hip  Shoulder external rotation Reaches back of head  Reaches up to ear  Elbow flexion 150 130  Elbow extension (-10*)  (-35*)  Wrist flexion 35 20  Wrist extension 50 10  (Blank rows = not tested)  UPPER EXTREMITY MMT:    Tested grossly as "push" and "pull": UE push 4-/5, UE pull 4/5  HAND FUNCTION: Grip strength: Right: 30 lbs; Left: 20  lbs  COORDINATION: Finger Nose Finger test: mildly ataxic but fairly accurate  Box and Blocks:  Right 11blocks, Left 5blocks  MUSCLE TONE:  Rt- normal, Lt- decreased and worsened by inattention/disuse  COGNITION: Overall cognitive status: Impaired and Difficulty to assess due to: severity of deficits; Poor attention, fluency, orientation, and executive function. Recall is fairly good as well as abstraction.  Seems to have personality changes due to vascular dementia- like an OCD type volitional perseveration of tapping with Rt hand that he can somewhat control.  He did become angry and agitated at the end of the session performing car transfer with spouse: he did shout at her.  VISION: Subjective report: no new changes per pt/spouse, uses "cheaters"   VISION ASSESSMENT: Visual fields seem intact though he had latent responses on the left side showing likely inattention/and/neglect, tracking somewhat delayed horizontally and vertically  PERCEPTION: Impaired: Inattention/neglect: does not attend to left visual field and does not attend to left side of body, Body scheme: poor quality of recognition with shaving, etc., and Spatial orientation: decreased in print and in orientation, especially to Lt side  PRAXIS: Not tested specifically but no overt/stated issues using pen, wearing glasses ,etc.   OBSERVATIONS: Pt taps and flicks and does seem to volitionally perseverate on making a rhythm/noise with Rt hand. He states he is aware of it and chooses to do it.  He does also seem to have mild resting and intention tremor.    TODAY'S TREATMENT:     DATE:10/20/22- Doffing jacket with min a, min v.c Seated pt attempted to donn socks several different ways however he encountered challenges despite cueing. Therapist demonstrated how to use sock aid and pt was able to donn first sock with mod/ max a, second sock with mod A and mod v.c with increased time required. Pt removed socks with min A and mod v.c  and donned slip on shoes following setup. Pt interactions today were different today likely related to medication change. He was a little more distracted and impulsive. Activities for following verbal directions, verbal comprehension and speech volume on Ipad, min-mod v.c  10/18/22- Pt's wife uploaded constant therapy to pt's I-pad. Therapist assisted pt with navigating setup and initial tasks with mod v.c and demonstration. Pt performed activities requiring reading comprehension, matching itmes to wors,  reading out load for speech volume. Pt required assistance and verbal and tactile cues for each new task.  Pt demonstrated improved ability to sustain attention to task however due to impulsivity he sometimes clicks answers before fully reading option. Standing at sink dynamic step and reach to targets, mod v.c for amplitude  10/14/22- Pt reports neck pain, hotpack applied while pt was seated on mat x 15 mins while performing low range functional reach with LUE to retrieve items from floor to place on mat then table, min facilitation/ v.c for amplitude.No adverse reactions to heat Closed chain shoulder flexion reach towards floor, min facilitation. Standing at sink stepping to right and left sides to target and functional reach with left and right UE's, mod v.c min facilitation, visual cues for upright posture and to reach to cabinet. Sitting at table to place pegs into pegboard with right and left UE, mos-max difficulty with LUE, min v.c Pt's wife brought in 1 pad however constant therapy was not compatible with Ipad.   PATIENT EDUCATION: Education details: See treatment section above for details,  Person educated: Patient and Spouse Education method: Explanation, Demonstration, Tactile cues, and Verbal cues Education comprehension: verbalized understanding, returned demonstration, verbal cues required, tactile cues required, and needs further education  HOME EXERCISE PROGRAM: Access Code:  [email or web address] URL: [email or web address] Date: 07/21/2022 Prepared by: Fannie Knee Exercises - Seated Shoulder Flexion AAROM with Dowel  - 4-6 x daily - 1 sets - 10-15 reps - Seated Shoulder Abduction AAROM with Dowel  - 4-6 x daily - 1 sets - 10-15 reps - Seated Shoulder External Rotation AAROM with Dowel  - 4-6 x daily - 1 sets - 10-15 reps - Seated Bicep Curls with Bar  - 4-6 x daily - 1 sets - 10-15 reps - Seated Shoulder Row with Anchored Resistance  - 4-6 x daily - 1 sets - 10-15 reps     GOALS: Goals reviewed with patient? Yes   SHORT TERM GOALS: (STG required if POC>30 days) Target Date: 09/23/22   Pt will demo/state understanding of initial HEP (or home activity suggestions to maintain function as appropriate) to improve functional skills. Goal status: 07/28/22- Met (addressed in past 2 sessions) 2. Pt will donn a t-shirt consistently with min A and v.c   Goal status: met , per wife's report 3. Pt will demonstrate ability to open the newspaper  with supervision min v.c Goal status:  ongoing, inconsistent, min , mod v.c at times 10/05/22 4.  Pt will consistently sustain focus to a simple functional task  in a minimally distracting environment x 6 mins with no more than 1 re-direct(ie: eating, functional task in clinic)  Goal status:  improved but inconsistent  however pt has been able to sutain attention to several functional tasks in clinic  x 6 mins several times recently 10/05/22  5. Pt will consistently open containers with only supervision and min v.c           Goal status: partially met,  opens most containers at this level  in the clinic, however sometimes has difficulty with coffee containers at home sometimes, per wife's report. 10/05/22    LONG TERM GOALS: Target Date: 11/16/22  Pt will improve functional ability by decreased impairment per Trinna Balloon assessment from 1/6 to 2/6 or better, for better quality of life. Goal status:  not met  1/6  2.  Caregiver will state his grooming ability/hygiene ability improved from moderate assist to supervision assist. Goal status: not met-Pt continues  to require mod A.  3.  He will show better functional use of left arm by improved box and blocks Test score from 5 blocks to at least 10 blocks. Goal status:not met but improved 8 blocks  4.  Pt will show ability to microwave instant coffee or small meal safely, to help decrease caregiver burden. Goal status: partially met, pt is able to microwave coffee with supervision, however occasional min A to open containers 5.  He will show improved cognitive ability enough to play a simple game with OT and caregiver. Goal status: approximating goal, pt has played a game of uno at home with family.  6.  Caregiver will state at least 4 ways to decrease perseveration with right arm, also stating that this behaviors at least somewhat better in the home now. Goal status: deferred perseverative behaviors have improved.  7. Pt will donn a flannel button front shirt consistently with min A and min v.c Baseline: mod-max Goal status:  ongoing, inconsitent min-mod A, min-mod v.c 09/15/22, pt doffed today with supervision and v.c 10/12/22 8.  Pt will use his LUE as an active assist for ADLS grossly 40% of the time with min v.c Baseline: uses 25% Goal status:  ongoing inconsistent use of RUE 10/12/22  9 Pt will stand at the countertop and perform functional activities  incorporating bilateral UE's with supervision and min v.c  Goal status:  ongoing, min assist at time-09/15/22 10. Pt will back up to a chair and sit down squarely at least 1/3 of the time with min v.c  Goal status: ongoing , performs in clinic with min-mod v.c 09/15/22 11..  Pt will open the newspaper and locate sports section with no more than min v.c                          Goal status:  ongoing, min-mod A at times 09/15/22  ASSESSMENT:  CLINICAL IMPRESSION: Pt is progressing towards goals. Pt  can benefit from reinforcement of strategies for LB dressing.  OT Frequency/DURATION: 2x week x 12 weeks however anticipate d/c after 8 weeks dependent on progress  PLANNED INTERVENTIONS: self care/ADL training, therapeutic exercise, therapeutic activity, neuromuscular re-education, manual therapy, balance training, functional mobility training, moist heat, cryotherapy, patient/family education, cognitive remediation/compensation, visual/perceptual remediation/compensation, psychosocial skills training, energy conservation, coping strategies training, DME and/or AE instructions, and Re-evaluation  RECOMMENDED OTHER SERVICES: He has already seen PT for balance and functional mobility, he would probably benefit from speech for cognitive issues as well, though OT will be working with cognition somewhat functionally now.  CONSULTED AND AGREED WITH PLAN OF CARE: Patient and family member/caregiver  PLAN FOR NEXT SESSION:    Work towards goals, dynamic step and reach, continue to  monitor neck pain, ADLS, LUE functional use    Keene Breath, OTR/L 4:34 PM 10/20/22

## 2022-10-21 ENCOUNTER — Ambulatory Visit: Payer: PPO | Admitting: Physical Therapy

## 2022-10-21 ENCOUNTER — Encounter: Payer: Self-pay | Admitting: Physical Therapy

## 2022-10-21 DIAGNOSIS — R296 Repeated falls: Secondary | ICD-10-CM

## 2022-10-21 DIAGNOSIS — R262 Difficulty in walking, not elsewhere classified: Secondary | ICD-10-CM

## 2022-10-21 DIAGNOSIS — I69354 Hemiplegia and hemiparesis following cerebral infarction affecting left non-dominant side: Secondary | ICD-10-CM

## 2022-10-21 DIAGNOSIS — M6281 Muscle weakness (generalized): Secondary | ICD-10-CM | POA: Diagnosis not present

## 2022-10-21 DIAGNOSIS — R278 Other lack of coordination: Secondary | ICD-10-CM

## 2022-10-21 NOTE — Therapy (Signed)
OUTPATIENT PHYSICAL THERAPY NEURO Treatment  Progress Note Reporting Period 09/27/22 to 10/21/22 For visits 21-30 See note below for Objective Data and Assessment of Progress/Goals.     Patient Name: William [name] MRN: [id number] DOB:December 17, 1940, 82 y.o., male Today's Date: 10/21/2022  PCP: Donato Schultz  DO REFERRING PROVIDER: same  END OF SESSION:  PT End of Session - 10/21/22 1353     Visit Number 30    Date for PT Re-Evaluation 10/30/22    PT Start Time 1353    PT Stop Time 1440    PT Time Calculation (min) 47 min    Equipment Utilized During Treatment Gait belt    Activity Tolerance Patient tolerated treatment well    Behavior During Therapy WFL for tasks assessed/performed                 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   HTN (hypertension) 07/19/2015   Hyperlipidemia    Major neurocognitive disorder 01/09/2014   Mild, related to stroke history   Nocturia    Renal insufficiency 06/25/2013   S/P carotid endarterectomy    BILATERAL ICA--  PATENT PER DUPLEX  05-19-2012   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/26/2017   Right calf   Urinary frequency    Vitamin D deficiency    Past Surgical History:  Procedure Laterality Date   APPENDECTOMY  AS CHILD   CARDIOVASCULAR STRESS TEST  03-27-2012  DR CRENSHAW   LOW RISK LEXISCAN STUDY-- PROBABLE NORMAL PERFUSION AND SOFT TISSUE ATTENUATION/  NO ISCHEMIA/ EF 51%   CAROTID ENDARTERECTOMY Bilateral LEFT  11-12-2008  DR GREG HAYES   RIGHT ICA  2006  ([facility])   CHOLECYSTECTOMY N/A 02/23/2022    Procedure: LAPAROSCOPIC CHOLECYSTECTOMY;  Surgeon: Quentin Ore, MD;  Location: [facility] OR;  Service: General;  Laterality: N/A;   CYSTOSCOPY W/ RETROGRADES Bilateral 06/22/2021   Procedure: CYSTOSCOPY WITH RETROGRADE PYELOGRAM;  Surgeon: Marcine Matar, MD;  Location: [facility];  Service: Urology;  Laterality: Bilateral;   CYSTOSCOPY WITH LITHOLAPAXY N/A 02/26/2013   Procedure: CYSTOSCOPY WITH LITHOLAPAXY;  Surgeon: Marcine Matar, MD;  Location: [facility];  Service: Urology;  Laterality: N/A;   ENDOSCOPIC RETROGRADE CHOLANGIOPANCREATOGRAPHY (ERCP) WITH PROPOFOL N/A 02/22/2022   Procedure: ENDOSCOPIC RETROGRADE CHOLANGIOPANCREATOGRAPHY (ERCP) WITH PROPOFOL;  Surgeon: Jeani Hawking, MD;  Location: [facility] ENDOSCOPY;  Service: Gastroenterology;  Laterality: N/A;   EYE SURGERY  Jan. 2016   cataract surgery both eyes   INGUINAL HERNIA REPAIR Right 11-08-2006   IR KYPHO EA ADDL LEVEL THORACIC OR LUMBAR  02/12/2021   IR RADIOLOGIST EVAL & MGMT  02/18/2021   MASS EXCISION N/A 03/03/2016   Procedure: EXCISION OF BACK  MASS;  Surgeon: Almond Lint, MD;  Location: [facility];  Service: General;  Laterality: N/A;   MOHS SURGERY Left 1/ 2016   Dr Margo Aye-- Basal cell   PROSTATE SURGERY     REMOVAL OF STONES  02/22/2022   Procedure: REMOVAL OF STONES;  Surgeon: Jeani Hawking, MD;  Location: [facility] ENDOSCOPY;  Service: Gastroenterology;;   Dennison Mascot  02/22/2022   Procedure: Dennison Mascot;  Surgeon: Jeani Hawking, MD;  Location: [facility] ENDOSCOPY;  Service: Gastroenterology;;   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-C N/A 06/22/2021   Procedure: TRANSURETHRAL RESECTION OF BLADDER TUMOR;  Surgeon: Marcine Matar, MD;  Location: [facility];  Service: Urology;  Laterality: N/A;   TRANSURETHRAL RESECTION OF PROSTATE N/A 02/26/2013   Procedure: TRANSURETHRAL RESECTION OF THE PROSTATE WITH GYRUS INSTRUMENTS;  Surgeon: Marcine Matar,  MD;  Location: [facility];  Service: Urology;  Laterality: N/A;   TRANSURETHRAL RESECTION OF PROSTATE N/A 06/22/2021   Procedure: TRANSURETHRAL RESECTION OF THE PROSTATE (TURP);  Surgeon: Marcine Matar, MD;  Location: [facility];  Service: Urology;  Laterality: N/A;   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   Insomnia    Prediabetes    Acute 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 (HCC)    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   Transaminitis 07/27/2021   UTI (urinary tract infection) 07/27/2021   CVA (cerebral vascular accident) (HCC) 07/27/2021   Fall 07/27/2021   Hyperglycemia 07/27/2021   Cholelithiasis 07/27/2021   Hypoxia 07/27/2021   Nausea and vomiting 07/27/2021   Acute metabolic encephalopathy 07/27/2021   Normocytic anemia 07/27/2021   Chronic back pain 07/27/2021   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   Closed fracture of first lumbar vertebra with routine healing 02/03/2021   Closed fracture of multiple ribs 11/18/2020   Anxiety 01/29/2020   Leg pain, bilateral 01/29/2020   Ingrown toenail 07/13/2019   Lumbar spondylosis 05/02/2018   Pain in left knee 03/09/2018   Osteoarthritis of left hip 01/16/2018   Trochanteric bursitis of left hip 01/16/2018   Preventative health care 09/26/2017   HTN  (hypertension) 07/19/2015   Hyperlipidemia 07/19/2015   Great toe pain 02/11/2014   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   Obesity (BMI 30-39.9) 06/25/2013   Renal insufficiency 06/25/2013   Weakness of left arm 06/25/2013   Sebaceous cyst 03/03/2011   Sprain of lumbar region 07/31/2010   Rib pain, left 08/29/2009   Carotid artery stenosis, asymptomatic, bilateral 05/02/2009   Eczema, atopic 05/31/2008   Vitamin D deficiency 03/01/2008   BPH (benign prostatic hyperplasia) 08/06/2007   Fasting hyperglycemia 12/21/2006   History of right MCA infarct 2006    ONSET DATE: 06/29/22  REFERRING DIAG:  Diagnosis  I63.9 (ICD-10-CM) - Cerebrovascular accident (CVA), unspecified mechanism (HCC)  W19.XXXD (ICD-10-CM) - Fall, subsequent encounter    THERAPY DIAG:  Muscle weakness (generalized)  Hemiplegia and hemiparesis following cerebral infarction affecting left non-dominant side (HCC)  Other lack of coordination  Difficulty in walking, not elsewhere classified  Repeated falls  Rationale for Evaluation and Treatment: Rehabilitation  SUBJECTIVE:  SUBJECTIVE STATEMENT:no issues reports a little sluggish today. PERTINENT HISTORY:  ALONZA ERRICO is a 82 year old man with dementia, CKD, HTN, CAD, HLD and history of CVA  (2006, 2021), Parkinson's  PAIN:  Are you having pain? No  PRECAUTIONS: Fall  WEIGHT BEARING RESTRICTIONS: No  FALLS: Has patient fallen in last 6 months? No  LIVING ENVIRONMENT: Lives with: lives with their family and lives with their spouse Lives in: House/apartment Stairs:  1 brick high step Has following equipment at home: Environmental consultant - 2 wheeled, Wheelchair (manual), Shower bench, bed side commode, Grab bars, and hospital bed, sliding pad for car  PLOF:  Independent with basic ADLs and Independent with household mobility without device  PATIENT GOALS: Walk around the block with his wife. Be able to use the dining room chair rather than have to use the W/C. Increased I with bed mobility and simple tasks at home like make some coffee or brush his teeth, Step over tub to shower, has bench, but he can't really use it. Up and down the one step to get to back deck.  OBJECTIVE:   DIAGNOSTIC FINDINGS:  MRI 2021 with degenerative changes in lumbar spine, L3-4 R subarticular stenosis  COGNITION: Overall cognitive status: History of cognitive impairments - at baseline   SENSATION: Not tested  COORDINATION: Moderately impaired BLE   MUSCLE LENGTH: Hamstrings: severely restricted B Thomas test: Severely restricted B.  POSTURE: rounded shoulders, forward head, increased thoracic kyphosis, posterior pelvic tilt, and flexed trunk   LOWER EXTREMITY ROM:   BLE extremely stiff throughout, limited hip ROM in all planes, B knees limited in extension.   LOWER EXTREMITY MMT:  3+/5 throughout. Unable to determine accuracy of MMT due to cognition. Functionally demosntrates poor coordinated activation and poor muscular endurance.   BED MOBILITY: Min A for Supine<> Sit. Patient was using a ladder in his hospital bed and could move MI.  TRANSFERS: Min A, mod VC, tends to lean back.  CURB: Min A-Has one step out to his deck.  STAIRS: N/A   GAIT: Gait pattern: step to pattern, decreased arm swing- Right, decreased arm swing- Left, decreased step length- Right, decreased step length- Left, shuffling, festinating, trunk flexed, narrow BOS, poor foot clearance- Right, and poor foot clearance- Left Distance walked: 76' Assistive device utilized: Environmental consultant - 2 wheeled Level of assistance: Occasional min A Comments: mod VC to stay close to walker, take longer steps.  FUNCTIONAL TESTS:  5 times sit to stand: 38.23  09/29/22  could not performs 5XSTS Timed up  and go (TUG): 47.91  with RW, 07/21/22 TUG 29  seconds without device, 09/29/22 25 seconds  TODAY'S TREATMENT:                                                                                                                               DATE:  10/21/22 Gait outside 1/2 parking island Folding towels from low surface to high surface with  turn while standing, carry towels and stack Volley ball sitting and standing Ball kicks On mat table bridges, rolls, green tband clamshells, partial sit ups Got him in quadraped raise one arm and then leg, difficulty getting gout of this position Step and turn and touch in the pobars Gait again out to car  10/19/22 Walk outside half way around parking [place] and a rest, then around our building back through the front door,  Volley ball sitting and standing, ball kicks sitting and standing In pbars marching and hip abduction 12" toe clears Tband on floor working on longer steps Sit to stand elevated surface cues needed Stairs step over step up and down Gait outside again cues and CGA  10/13/22 Walk outside with wife and patient, had wife do the HHA and the CGA, gave her instruction in safety and verbal cues as well as tactile cues HS curls 20# 3x10 Leg extension 5# 3x10 Leg press 20# 2x10 5# single arm pulls 4" step to his left working on picking leg up and clearing Gait again 4 x 100 feet with me more in front of him and him trying to put his feet where I am, this seemed to help with the step length  10/11/22 Walk outside HHA on the right, cues for step length 2 rests gait was in the grass, on asphalt and up/dwn curbs Nustep level 5 x 6 minutes Volleyball in sitting Gait inside 100 feet with the right hand HHA x 6 Side stepping, fwd and backward walking 2# marches 2# LAQ 2# hip flexion and abduction up onto a 6" step while sitting Sit to stand was difficult for him  10/06/22 STm to the left neck and upper trap area 2.5# LAQ 2.5# marches Red tband  HS curls Red tband hip adduction Red tband hip abduction Red tband ankle exercises Nustep level 4 x 6 minutes Gait outside with HHA on the right 3x150'  09/29/22 Gait outside total 600 feet with 2 rests some walking in grass, uneven terrain See TUG, 5X STS above Worked on bed mobility and on our harder surfaces he is independent with in and out of bed and can position himself Volleyball  Side stepping, backward walking  09/27/22 Nustep level 5 x 5 minutes Gait outside halfway around the parking [place] then rest, then back to this building and a rest outsie and then in the grass around to the front door with CGA/HHA on the right and some cues for step length Standing in Pbars marching and hip abduction 2x10 each Fwd/bkwd walking in the Pbars Seated volleyball Standing kicks Direction changes Seated 6" clears to practice in and out of car Gait back out to the car  09/22/2022 Nustep level 5 x 5 minutes Bike level 5 x 6 minutes Gait outside 1/2 parking [place] then sit and rest and then back around, right HHA and use of gait belt Standing straight arm pulls 5# one at a time Sit to stand with weighted ball and a higher level seating surface Standing left leg 4" toe clears Standing with right foot on 4" step for weight bearing and use of the left leg Standing and turning picking up cones and placing them other areas Folding laundry from one surface and turning and putting on another surface, needs some cues to use the left arm   09/20/22: NuStep L4 x 6 minutes Stair taps with opposite legs, BUE support, x 5 each, CGA Long steps across air ex planks placed horizontally, UUE support on rail  of parallel bars, cues to face forward, support weight with R hand on bar.  Repeat with side stepping each direction Practiced sit to stand in parallel bars, emphasize forward weight shift, able to rise with CGA Moved to mat to continue forward weight shifts and sit to stand, CGA Ambulated x 70',  with CGA, x 3, in between sessions at ll bars.festinating gait . Seated forward trunk leans, with large therapeutic ball  2 sets 10 reps Seated forward rolls with ball , and press ups, partial standing , 2 sets 5 reps.  09/16/22 NuStep L4 x 6 minutes Functional re-assessment for PN-see goals Stair taps with opposite legs, BUE support, x 5 each, CGA Long steps across air ex planks placed horizontally, UUE support on rail of parallel bars. Repeat with side stepping each direction Standing weight shifts on air ex pad, no UE support, CGA, reluctant to weight shift forward and back. Practiced sit to stand in parallel bars, emphasize forward weight shift, able to rise with CGA Moved to mat to continue forward weight shifts and sit to stand, CGA Ambulated x 50', improved upright posture and foot clearance.  09/14/22 NuStep L5 x90mins UBE L2 forwards and back  Volleyball hits sitting 2x10 each hand  Leg ext 5# 2x10 HS curls10# 2x10 Ball squeezes 2x10 Hip abduction with green band 2x10  09/07/22 Nustep level 5 x 6 minutes  Tmill  MPH CGA x 1 minute Gait outside around the parking [place] with one rest break Stairs step over step In pbars marching and hip abduction Walking forward and backward in pbars Gait outside in the grass  09/02/22 Nustep level 5 x 5 minutes Walking HHA outside halfway around the parking [place] and then rest on table, then back in 2.5# on the left ankle having him sit and raise foot up and abduct to a 6" step on the left Bed mobility rolling side to side Bridges, partial sit ups Walk 300 feet with HHA a lot of cues for the step length Ball kicks Volley ball standing and sitting  PATIENT EDUCATION: Education details: POC Person educated: Patient and Spouse Education method: Explanation Education comprehension: verbalized understanding  HOME EXERCISE PROGRAM: TBD  GOALS: Goals reviewed with patient? Yes  SHORT TERM GOALS: Target date: 07/19/22  I with  initial HEP Baseline: Goal status: ongoing  2. Decrease 5 x STS to < 25 sec to demonstrate improved functional strength  Baseline: 38 sec  Goal status: 08/09/22 14 sec-met  3. Decrease TUG to < 35 sec to demonstrate improved functional balance  Baseline: 47 sec  Goal status: 15 seconds 09/29/22 met  4. Perform sit <> stand transfers from average chair height with S and VC to be able to use the swivel chair at his dining table.  Baseline: Min A  Goal status: 09/16/22-Inconsistently performs  with S from average height. At times requires CGA to cue forward weight shift. ongoing  LONG TERM GOALS: Target date: 09/29/22  Patient will be able to step over his tub at home using grab bars for stability with CGA to access his shower. Baseline: Unable- has bench, but wife reports he steps over then sits. Goal status: 09/16/22-met  2.  Decrease 5 x STS to < 18 sec to demonstrate improved functional strength Baseline:  Goal status: not met 62 seconds and able to do only 3 09/29/22  3.  Decrease TUG to < 25 sec to demonstrate improved functional balance Baseline:  Goal status: 09/29/22 15 seconds  4.  Patient will perform his bed mobility with MI. Baseline: Min A Goal status: on hard surface mat he is independent but on soft bed at home he struggles  5.  Patient will ambulate at least 400' with LRAD, CGA, demonstrate improved upright posture, longer step length. Baseline:  Goal status: 09/16/22-Amb >400', but began to fatigue after about 300' with more flexed posture, L foot shuffling more, unable to maintain safe technique. ongoing  6.  Patient will manage up and down a single step with LRAD, CGA to access his deck at home. Baseline: Unable Goal status: 09/16/22-Up and down 1 step with U rail. met  ASSESSMENT:  CLINICAL IMPRESSION: Patient started out walking very well, good length steps and speed, then about halfway through he started shuffling.  We did more functional tasks of folding towels  and with a task he does well, also did well with the step turn and touch.  I did get him in quadraped for the first time today.  He did okay with this.  Just difficulty getting out of this position OBJECTIVE IMPAIRMENTS: Abnormal gait, decreased activity tolerance, decreased balance, decreased cognition, decreased coordination, decreased endurance, decreased mobility, difficulty walking, decreased ROM, decreased strength, decreased safety awareness, impaired flexibility, impaired UE functional use, improper body mechanics, and postural dysfunction.   ACTIVITY LIMITATIONS: carrying, lifting, bending, sitting, standing, squatting, stairs, transfers, bed mobility, bathing, toileting, dressing, hygiene/grooming, and locomotion level  PARTICIPATION LIMITATIONS: meal prep, cleaning, laundry, interpersonal relationship, shopping, and community activity  PERSONAL FACTORS: Age, Past/current experiences, and 3+ comorbidities: CVA, Parkinson's traits, dementia  are also affecting patient's functional outcome.   REHAB POTENTIAL: Good  CLINICAL DECISION MAKING: Evolving/moderate complexity  EVALUATION COMPLEXITY: Moderate  PLAN:  PT FREQUENCY: 2x/week  PT DURATION: 12 weeks  PLANNED INTERVENTIONS: Therapeutic exercises, Therapeutic activity, Neuromuscular re-education, Balance training, Gait training, Patient/Family education, Self Care, Joint mobilization, Stair training, Moist heat, and Manual therapy  PLAN FOR NEXT SESSION:Continue to work on his balance and function Stacie Glaze, PT 10/21/22 1:54 PM

## 2022-10-25 ENCOUNTER — Ambulatory Visit: Payer: PPO | Admitting: Occupational Therapy

## 2022-10-25 ENCOUNTER — Other Ambulatory Visit: Payer: Self-pay

## 2022-10-25 DIAGNOSIS — M6281 Muscle weakness (generalized): Secondary | ICD-10-CM

## 2022-10-25 DIAGNOSIS — R41840 Attention and concentration deficit: Secondary | ICD-10-CM

## 2022-10-25 DIAGNOSIS — I69354 Hemiplegia and hemiparesis following cerebral infarction affecting left non-dominant side: Secondary | ICD-10-CM

## 2022-10-25 DIAGNOSIS — R278 Other lack of coordination: Secondary | ICD-10-CM

## 2022-10-26 ENCOUNTER — Encounter: Payer: Self-pay | Admitting: Physical Therapy

## 2022-10-26 ENCOUNTER — Other Ambulatory Visit: Payer: Self-pay

## 2022-10-26 ENCOUNTER — Other Ambulatory Visit (HOSPITAL_BASED_OUTPATIENT_CLINIC_OR_DEPARTMENT_OTHER): Payer: Self-pay

## 2022-10-26 ENCOUNTER — Encounter (HOSPITAL_COMMUNITY): Payer: Self-pay | Admitting: Psychiatry

## 2022-10-26 ENCOUNTER — Ambulatory Visit (HOSPITAL_COMMUNITY): Payer: PPO | Admitting: Psychiatry

## 2022-10-26 ENCOUNTER — Ambulatory Visit: Payer: PPO | Admitting: Physical Therapy

## 2022-10-26 VITALS — BP 142/81 | HR 74

## 2022-10-26 DIAGNOSIS — M6281 Muscle weakness (generalized): Secondary | ICD-10-CM

## 2022-10-26 DIAGNOSIS — R296 Repeated falls: Secondary | ICD-10-CM

## 2022-10-26 DIAGNOSIS — R262 Difficulty in walking, not elsewhere classified: Secondary | ICD-10-CM

## 2022-10-26 DIAGNOSIS — I69354 Hemiplegia and hemiparesis following cerebral infarction affecting left non-dominant side: Secondary | ICD-10-CM

## 2022-10-26 DIAGNOSIS — F0630 Mood disorder due to known physiological condition, unspecified: Secondary | ICD-10-CM | POA: Diagnosis not present

## 2022-10-26 MED ORDER — TRAZODONE HCL 150 MG PO TABS
75.0000 mg | ORAL_TABLET | Freq: Every day | ORAL | 1 refills | Status: DC
  Filled 2022-10-26 – 2022-11-22 (×2): qty 20, 40d supply, fill #0
  Filled 2023-01-01: qty 20, 40d supply, fill #1

## 2022-10-26 MED ORDER — LORAZEPAM 0.5 MG PO TABS
1.7500 mg | ORAL_TABLET | Freq: Every day | ORAL | 4 refills | Status: DC
  Filled 2022-10-26: qty 105, 30d supply, fill #0
  Filled 2022-11-28: qty 105, 30d supply, fill #1
  Filled 2022-12-26: qty 105, 30d supply, fill #2
  Filled 2022-12-29: qty 90, 30d supply, fill #2
  Filled 2023-01-23: qty 90, 30d supply, fill #3
  Filled 2023-02-13 – 2023-02-17 (×2): qty 90, 30d supply, fill #4
  Filled 2023-02-21: qty 105, 30d supply, fill #4
  Filled 2023-02-21: qty 90, 30d supply, fill #4
  Filled 2023-03-21: qty 30, 8d supply, fill #5
  Filled ????-??-??: fill #4

## 2022-10-26 MED ORDER — CARBAMAZEPINE 100 MG/5ML PO SUSP
400.0000 mg | Freq: Every day | ORAL | 3 refills | Status: DC
  Filled 2022-10-26: qty 600, 30d supply, fill #0
  Filled 2022-12-05: qty 600, 30d supply, fill #1
  Filled 2022-12-31: qty 600, 30d supply, fill #2
  Filled 2023-02-05: qty 600, 30d supply, fill #3

## 2022-10-26 NOTE — Progress Notes (Signed)
      Today the patient is seen with his wife [name].  It is noted that the patient goes to physical therapy and occupational therapy were placed a week.  He is making some slow progress.  From my point he is doing better and that he is much less irritable.  He is not over sedated.  He takes Tegretol as prescribed.  He is eating well.  He drinks no alcohol and uses no drugs.  His wife [name] is very responsive and involved in his care.  Patient takes a Tax adviser Note  Est Patient 1  My Note      Note Details  Service:  Psychiatry    Insert SmartText  e of trazodone which helps him sleep but he claims that he does not sleep well enough at night.  He does not try to get.  There are variety of reasons we might have been using Loorev as his benzodiazepine but at this time because its cost is inappropriately we will discontinue it.  Instead we will adjust his Ativan appropriately.  Patient denies persistent depression.  He has periods where he feels depressed.  Where he is anxious.  His mood state does not seem to interfere with his life.  It does not affect his functioning.  He said he seems to be motivated that he gets up every day with his wife and goes to therapy.  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  He takes Tegretol 400 mg and his last Tegretol level was 6.1.  He also takes Ativan.  On adjusted doses to taking 0.25 mg doses morning 0.5 mg midday and 1 mg dosage at night.  Therefore he will take a total of 1.75 mg a day. Will correct the way it is written by giving him 0.5 mg half in the morning 1 midday and 2 at night.  He will continue taking Tegretol 400 mg as prescribed.  He will continue taking trazodone 75 mg.  He will return to see me in 4 to 5 months. At this point the patient is stable.

## 2022-10-26 NOTE — Therapy (Signed)
OUTPATIENT OCCUPATIONAL THERAPY TREATMENT NOTE  Patient Name: William [name] MRN: [id number] DOB:Nov 30, 1940, 82 y.o., male Today's Date: 10/26/2022  PCP & REFERRING PROVIDER:  [name], [name], DO    END OF SESSION:  OT End of Session - 10/25/22 1500     Visit Number 26    Number of Visits 32    Date for OT Re-Evaluation 11/16/22    Authorization Type [employer]    Authorization - Visit Number 26    Progress Note Due on Visit 30    OT Start Time 1448    OT Stop Time 1530    OT Time Calculation (min) 42 min    Activity Tolerance Patient tolerated treatment well    Behavior During Therapy WFL for tasks assessed/performed                            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   HTN (hypertension) 07/19/2015   Hyperlipidemia    Major neurocognitive disorder 01/09/2014   Mild, related to stroke history   Nocturia    Renal insufficiency 06/25/2013   S/P carotid endarterectomy    BILATERAL ICA--  PATENT PER DUPLEX  05-19-2012   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/26/2017   Right calf   Urinary frequency    Vitamin D deficiency    Past Surgical History:  Procedure Laterality Date   APPENDECTOMY  AS CHILD   CARDIOVASCULAR STRESS TEST  03-27-2012  DR CRENSHAW   LOW RISK LEXISCAN STUDY-- PROBABLE NORMAL PERFUSION AND SOFT TISSUE ATTENUATION/  NO ISCHEMIA/ EF 51%   CAROTID ENDARTERECTOMY Bilateral LEFT  11-12-2008  DR GREG HAYES   RIGHT ICA  2006  ([facility])   CHOLECYSTECTOMY N/A 02/23/2022   Procedure:  LAPAROSCOPIC CHOLECYSTECTOMY;  Surgeon: Quentin Ore, MD;  Location: [facility] OR;  Service: General;  Laterality: N/A;   CYSTOSCOPY W/ RETROGRADES Bilateral 06/22/2021   Procedure: CYSTOSCOPY WITH RETROGRADE PYELOGRAM;  Surgeon: Marcine Matar, MD;  Location: [facility];  Service: Urology;  Laterality: Bilateral;   CYSTOSCOPY WITH LITHOLAPAXY N/A 02/26/2013   Procedure: CYSTOSCOPY WITH LITHOLAPAXY;  Surgeon: Marcine Matar, MD;  Location: [facility];  Service: Urology;  Laterality: N/A;   ENDOSCOPIC RETROGRADE CHOLANGIOPANCREATOGRAPHY (ERCP) WITH PROPOFOL N/A 02/22/2022   Procedure: ENDOSCOPIC RETROGRADE CHOLANGIOPANCREATOGRAPHY (ERCP) WITH PROPOFOL;  Surgeon: Jeani Hawking, MD;  Location[facility] ENDOSCOPY;  Service: Gastroenterology;  Laterality: N/A;   EYE SURGERY  Jan. 2016   cataract surgery both eyes   INGUINAL HERNIA REPAIR Right 11-08-2006   IR KYPHO EA ADDL LEVEL THORACIC OR LUMBAR  02/12/2021   IR RADIOLOGIST EVAL & MGMT  02/18/2021   MASS EXCISION N/A 03/03/2016   Procedure: EXCISION OF BACK  MASS;  Surgeon: Almond Lint, MD;  Location: [facility];  Service: General;  Laterality: N/A;   MOHS SURGERY Left 1/ 2016   Dr Margo Aye-- Basal cell   PROSTATE SURGERY  REMOVAL OF STONES  02/22/2022   Procedure: REMOVAL OF STONES;  Surgeon: Jeani Hawking, MD;  Location: [facility] ENDOSCOPY;  Service: Gastroenterology;;   Dennison Mascot  02/22/2022   Procedure: Dennison Mascot;  Surgeon: Jeani Hawking, MD;  Location: [facility] ENDOSCOPY;  Service: Gastroenterology;;   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-C N/A 06/22/2021   Procedure: TRANSURETHRAL RESECTION OF BLADDER TUMOR;  Surgeon: Marcine Matar, MD;  Location: [facility];  Service: Urology;  Laterality: N/A;   TRANSURETHRAL RESECTION OF PROSTATE N/A 02/26/2013   Procedure: TRANSURETHRAL RESECTION OF THE PROSTATE WITH GYRUS INSTRUMENTS;  Surgeon: Marcine Matar, MD;  Location:  [facility];  Service: Urology;  Laterality: N/A;   TRANSURETHRAL RESECTION OF PROSTATE N/A 06/22/2021   Procedure: TRANSURETHRAL RESECTION OF THE PROSTATE (TURP);  Surgeon: Marcine Matar, MD;  Location: [facility];  Service: Urology;  Laterality: N/A;   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   Insomnia    Prediabetes    Acute 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 (HCC)    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   Transaminitis 07/27/2021   UTI (urinary tract infection) 07/27/2021   CVA (cerebral vascular accident) (HCC) 07/27/2021   Fall 07/27/2021   Hyperglycemia 07/27/2021   Cholelithiasis 07/27/2021   Hypoxia 07/27/2021   Nausea and vomiting 07/27/2021   Acute metabolic encephalopathy 07/27/2021   Normocytic anemia 07/27/2021   Chronic back pain 07/27/2021   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   Closed fracture of first lumbar vertebra with routine healing 02/03/2021   Closed fracture of multiple ribs 11/18/2020   Anxiety 01/29/2020   Leg pain, bilateral 01/29/2020   Ingrown toenail 07/13/2019   Lumbar spondylosis 05/02/2018   Pain in left knee 03/09/2018   Osteoarthritis of left hip 01/16/2018   Trochanteric bursitis of left hip 01/16/2018   Preventative health care 09/26/2017   HTN (hypertension) 07/19/2015    Hyperlipidemia 07/19/2015   Great toe pain 02/11/2014   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   Obesity (BMI 30-39.9) 06/25/2013   Renal insufficiency 06/25/2013   Weakness of left arm 06/25/2013   Sebaceous cyst 03/03/2011   Sprain of lumbar region 07/31/2010   Rib pain, left 08/29/2009   Carotid artery stenosis, asymptomatic, bilateral 05/02/2009   Eczema, atopic 05/31/2008   Vitamin D deficiency 03/01/2008   BPH (benign prostatic hyperplasia) 08/06/2007   Fasting hyperglycemia 12/21/2006   History of right MCA infarct 2006    ONSET DATE: Acute exacerbation of chronic symptoms (most recent hospitalization 02/26/22 - 03/12/22, followed by SNF and home health)   REFERRING DIAG:  I63.9 (ICD-10-CM) - Cerebrovascular accident (CVA), unspecified mechanism (HCC)  W19.XXXD (ICD-10-CM) - Fall, subsequent encounter    THERAPY DIAG:  Muscle weakness (generalized)  Hemiplegia and hemiparesis following cerebral infarction affecting left non-dominant side (HCC)  Other lack of coordination  Attention and concentration deficit  Rationale for Evaluation  and Treatment: Rehabilitation  PERTINENT HISTORY: PMHx includes L basal ganglia and corona radiata infarct 07/27/21, L lower homonymous quadrantanopia; R MCA infarct in 2006 w/ L-sided hemiparesis, NCD, carotid artery disease (bilateral), HTN, HLD,CKD3, vascular dementia, and hx of bladder cancer. He has had falls in 2023, fracturing his clavicle in August, then he had stomach surgery in Sept 2023. He has He needs assist at baseline for ADLs. When last documented in acute rehab after having his gall-bladder removed, (03/09/22) he required moderate to maximum assist x2 for most ADLs (more assist for ADLs the require standing), and they recommended rehab in skilled nursing setting. He apparently got Covid at some point and also attended a SNF. Once he D/C home, he had some episodes of combativeness at night  with "sun-downing."  He is  communicative and appropriate, though appears easily confused and distracted, not fully oriented to time and place (Ax2, states date is 83 and cannot say what city he lives in). His wife provides most details, and he is participative, though a poor historian at times. In a nut shell- he has been suffering from dementia and post-stroke effects for years, and his issues were recently exacerbated in Sept of last year after abdominal surgery. He has less use of Lt "stroke side" and new, ritualistic "ticks" in mouth and Rt hand/arm.  Wife states that they do have a caregiver come M, W, F for 4 hours in the middle of the day to mainly help him bathe and dress, which usually tires him out. He has been more combative towards the end of the day as well.   PRECAUTIONS: Fall and Other: Lt hemi, poor cognition, LATEX and adhesive allergies, etc.  ;WEIGHT BEARING RESTRICTIONS: No   SUBJECTIVE:   SUBJECTIVE STATEMENT: Pt's wife reports she did not give pt his ativan today as she was fearful it would make him drowsy  Pt accompanied by:  his wife   PAIN: no     FALLS: Has patient fallen in last 6 months? Yes. Number of falls at least 1 resulting in clavicle fx (now healed)   LIVING ENVIRONMENT: Lives with: lives with their spouse Lives in: House/apartment Has following equipment at home: manual w/c, 2 w/w, weighted utensils, shower bench (for tub), bed cane, hospital bed, raised toilet, and possibly more  PLOF: Needs assistance with ADLs, Needs assistance with homemaking, Needs assistance with gait, and Needs assistance with transfers  PATIENT GOALS: at eval- increase personal hygiene skills (brush teeth, shaving)  prepare instant coffee or microwave meal improve functional cognitive skills to play games, etc.,   increase Lt arm usage try to help decrease Rt hand/arm perseveration (flicking, tapping, noise making) Improve fnl tranfers in/out of multiple seats (tub bench, swivel chair, etc.)     OBJECTIVE: (All objective assessments below are from initial evaluation on: 07/07/22 unless otherwise specified.)   HAND DOMINANCE: Right  ADLs: Overall ADLs: Wife states decreased ability since hospitalization in Sept 23.  Transfers/ambulation related to ADLs: Min A transfer, CGA for mobility Eating: set up Grooming: Mod A- needs assist with oral care, shaving, but can comb his hair UB Dressing: Min A  LB Dressing: Dep  Toileting:  Mod  Bathing: Mod  Tub Shower transfers: Min A  Equipment: Emergency planning/management officer, Grab bars, and Feeding equipment  IADLs: completely dependent for years now, except some ability to use microwave which is now not possible after hospitalization   MOBILITY STATUS: Needs Assist: CGA - Min A, Hx of falls, Festination, difficulty  with turns, and neglect on Lt side  POSTURE COMMENTS:  rounded shoulders, forward head, and weight shift left  ACTIVITY TOLERANCE: Activity tolerance: he became tired and somewhat agitated by the end of the OT evaluation today (after PT and OT about 2.5hours with mostly sedate activities in seated positions)  FUNCTIONAL OUTCOME MEASURES: [facility] 17/30  showing problems with visuospatial/executive function, attention, fluency, and orientation. (at least 26/30 is needed to be considered "normal")  [employer] in ADLs: 1/6  (very dependent)  UPPER EXTREMITY ROM:    Active ROM Right eval Left eval  Shoulder scaption 100 85  Shoulder abduction    Shoulder adduction    Shoulder extension    Shoulder internal rotation Reaches small of back Reaches to hip  Shoulder external rotation Reaches back of head  Reaches up to ear  Elbow flexion 150 130  Elbow extension (-10*)  (-35*)  Wrist flexion 35 20  Wrist extension 50 10  (Blank rows = not tested)  UPPER EXTREMITY MMT:    Tested grossly as "push" and "pull": UE push 4-/5, UE pull 4/5  HAND FUNCTION: Grip strength: Right: 30 lbs; Left: 20 lbs  COORDINATION: Finger Nose  Finger test: mildly ataxic but fairly accurate  Box and Blocks:  Right 11blocks, Left 5blocks  MUSCLE TONE:  Rt- normal, Lt- decreased and worsened by inattention/disuse  COGNITION: Overall cognitive status: Impaired and Difficulty to assess due to: severity of deficits; Poor attention, fluency, orientation, and executive function. Recall is fairly good as well as abstraction.  Seems to have personality changes due to vascular dementia- like an OCD type volitional perseveration of tapping with Rt hand that he can somewhat control.  He did become angry and agitated at the end of the session performing car transfer with spouse: he did shout at her.  VISION: Subjective report: no new changes per pt/spouse, uses "cheaters"   VISION ASSESSMENT: Visual fields seem intact though he had latent responses on the left side showing likely inattention/and/neglect, tracking somewhat delayed horizontally and vertically  PERCEPTION: Impaired: Inattention/neglect: does not attend to left visual field and does not attend to left side of body, Body scheme: poor quality of recognition with shaving, etc., and Spatial orientation: decreased in print and in orientation, especially to Lt side  PRAXIS: Not tested specifically but no overt/stated issues using pen, wearing glasses ,etc.   OBSERVATIONS: Pt taps and flicks and does seem to volitionally perseverate on making a rhythm/noise with Rt hand. He states he is aware of it and chooses to do it.  He does also seem to have mild resting and intention tremor.    TODAY'S TREATMENT:     DATE:10/25/22-Seated on mat, closed chain shoulder flexion reaching for the floor, min-mod v.c Standing at countertop rocking forwards and backwards for gentle stretch, while weightbearing through bilateral UE's, min v.c for upright posture and scapular retraction when rocking forwards. Pt's wife brought in a butterfly house kit. Therapist assisted pt with fabricating butterfly  house. Therapist read the instructions out loud  and had pt locate various pieces, with min-mod v.c Pt required verbal cues and assistance for correct placement of pieces Pt was able to assist with placement of nails and hammering nails using RUE with min difficulty, mod v.c for amplitude with therapist stabilizing the pieces pt was nailing together. Pt kept LUE in a safe position away from hammer during task. Pt demonstrated good attention to task and he sustained attention for greater than 10 mins at a time prior  to direction during task.  10/20/22- Doffing jacket with min a, min v.c Seated pt attempted to donn socks several different ways however he encountered challenges despite cueing. Therapist demonstrated how to use sock aid and pt was able to donn first sock with mod/ max a, second sock with mod A and mod v.c with increased time required. Pt removed socks with min A and mod v.c and donned slip on shoes following setup. Pt interactions today were different today likely related to medication change. He was a little more distracted and impulsive. Activities for following verbal directions, verbal comprehension and speech volume on Ipad, min-mod v.c    PATIENT EDUCATION: Education details: See treatment section above for details,  Person educated: Patient and Spouse Education method: Explanation, Demonstration, Tactile cues, and Verbal cues Education comprehension: verbalized understanding, returned demonstration, verbal cues required, tactile cues required, and needs further education  HOME EXERCISE PROGRAM: Access Code: [email or web address] URL: [email or web address] Date: 07/21/2022 Prepared by: Fannie Knee Exercises - Seated Shoulder Flexion AAROM with Dowel  - 4-6 x daily - 1 sets - 10-15 reps - Seated Shoulder Abduction AAROM with Dowel  - 4-6 x daily - 1 sets - 10-15 reps - Seated Shoulder External Rotation AAROM with Dowel  - 4-6 x daily - 1 sets - 10-15 reps - Seated Bicep  Curls with Bar  - 4-6 x daily - 1 sets - 10-15 reps - Seated Shoulder Row with Anchored Resistance  - 4-6 x daily - 1 sets - 10-15 reps     GOALS: Goals reviewed with patient? Yes   SHORT TERM GOALS: (STG required if POC>30 days) Target Date: 09/23/22   Pt will demo/state understanding of initial HEP (or home activity suggestions to maintain function as appropriate) to improve functional skills. Goal status: 07/28/22- Met (addressed in past 2 sessions) 2. Pt will donn a t-shirt consistently with min A and v.c   Goal status: met , per wife's report 3. Pt will demonstrate ability to open the newspaper  with supervision min v.c Goal status:  ongoing, inconsistent, min , mod v.c at times 10/05/22 4.  Pt will consistently sustain focus to a simple functional task  in a minimally distracting environment x 6 mins with no more than 1 re-direct(ie: eating, functional task in clinic)  Goal status: partially met, pt demonstrates ability to perform in clinic when engaged in a task that is meaningful to him 10/26/22  5. Pt will consistently open containers with only supervision and min v.c           Goal status: partially met,  opens most containers at this level  in the clinic, however sometimes has difficulty with coffee containers at home sometimes, per wife's report. 10/05/22    LONG TERM GOALS: Target Date: 11/16/22  Pt will improve functional ability by decreased impairment per Trinna Balloon assessment from 1/6 to 2/6 or better, for better quality of life. Goal status:  not met 1/6  2.  Caregiver will state his grooming ability/hygiene ability improved from moderate assist to supervision assist. Goal status: not met-Pt continues to require mod A.  3.  He will show better functional use of left arm by improved box and blocks Test score from 5 blocks to at least 10 blocks. Goal status:not met but improved 8 blocks  4.  Pt will show ability to microwave instant coffee or small meal  safely, to help decrease caregiver burden. Goal status: partially met, pt is able to microwave coffee with supervision, however  occasional min A to open containers 5.  He will show improved cognitive ability enough to play a simple game with OT and caregiver. Goal status: approximating goal, pt has played a game of uno at home with family.  6.  Caregiver will state at least 4 ways to decrease perseveration with right arm, also stating that this behaviors at least somewhat better in the home now. Goal status: deferred perseverative behaviors have improved.  7. Pt will donn a flannel button front shirt consistently with min A and min v.c Baseline: mod-max Goal status:  ongoing, inconsitent min-mod A, min-mod v.c 09/15/22, pt doffed today with supervision and v.c 10/12/22 8.  Pt will use his LUE as an active assist for ADLS grossly 40% of the time with min v.c Baseline: uses 25% Goal status:  ongoing inconsistent use of RUE 10/12/22  9 Pt will stand at the countertop and perform functional activities  incorporating bilateral UE's with supervision and min v.c  Goal status:  ongoing, min assist at time-09/15/22 10. Pt will back up to a chair and sit down squarely at least 1/3 of the time with min v.c  Goal status: ongoing , performs in clinic with min-mod v.c 09/15/22 11..  Pt will open the newspaper and locate sports section with no more than min v.c                          Goal status:  ongoing, min-mod A at times 09/15/22  ASSESSMENT:  CLINICAL IMPRESSION: Pt is progressing towards goals.Pt demonstrated good sustained attention today while constructing butterfly house. OT Frequency/DURATION: 2x week x 12 weeks however anticipate d/c after 8 weeks dependent on progress  PLANNED INTERVENTIONS: self care/ADL training, therapeutic exercise, therapeutic activity, neuromuscular re-education, manual therapy, balance training, functional mobility training, moist heat, cryotherapy, patient/family education,  cognitive remediation/compensation, visual/perceptual remediation/compensation, psychosocial skills training, energy conservation, coping strategies training, DME and/or AE instructions, and Re-evaluation  RECOMMENDED OTHER SERVICES: He has already seen PT for balance and functional mobility, he would probably benefit from speech for cognitive issues as well, though OT will be working with cognition somewhat functionally now.  CONSULTED AND AGREED WITH PLAN OF CARE: Patient and family member/caregiver  PLAN FOR NEXT SESSION:    Work towards goals, dynamic step and reach, ADLS, LUE functional use    Keene Breath, OTR/L 8:18 AM 10/26/22

## 2022-10-26 NOTE — Therapy (Signed)
OUTPATIENT PHYSICAL THERAPY NEURO Treatment   Patient Name: William [name] MRN: [id number] DOB:03/10/1941, 82 y.o., male Today's Date: 10/26/2022  PCP: Donato Schultz  DO REFERRING PROVIDER: same  END OF SESSION:  PT End of Session - 10/26/22 1101     Visit Number 31    Date for PT Re-Evaluation 10/30/22    PT Start Time 1055    PT Stop Time 1144    PT Time Calculation (min) 49 min    Equipment Utilized During Treatment Gait belt    Activity Tolerance Patient tolerated treatment well    Behavior During Therapy WFL for tasks assessed/performed                 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   HTN (hypertension) 07/19/2015   Hyperlipidemia    Major neurocognitive disorder 01/09/2014   Mild, related to stroke history   Nocturia    Renal insufficiency 06/25/2013   S/P carotid endarterectomy    BILATERAL ICA--  PATENT PER DUPLEX  05-19-2012   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/26/2017   Right calf   Urinary frequency    Vitamin D deficiency    Past Surgical History:  Procedure Laterality Date   APPENDECTOMY  AS CHILD   CARDIOVASCULAR STRESS TEST  03-27-2012  DR CRENSHAW   LOW RISK LEXISCAN STUDY-- PROBABLE NORMAL PERFUSION AND SOFT TISSUE ATTENUATION/  NO ISCHEMIA/ EF 51%   CAROTID ENDARTERECTOMY Bilateral LEFT  11-12-2008  DR GREG HAYES   RIGHT ICA  2006  ([facility])   CHOLECYSTECTOMY N/A 02/23/2022   Procedure: LAPAROSCOPIC CHOLECYSTECTOMY;  Surgeon: Quentin Ore, MD;  Location: [facility];  Service: General;  Laterality: N/A;    CYSTOSCOPY W/ RETROGRADES Bilateral 06/22/2021   Procedure: CYSTOSCOPY WITH RETROGRADE PYELOGRAM;  Surgeon: Marcine Matar, MD;  Location: [facility] [place];  Service: Urology;  Laterality: Bilateral;   CYSTOSCOPY WITH LITHOLAPAXY N/A 02/26/2013   Procedure: CYSTOSCOPY WITH LITHOLAPAXY;  Surgeon: Marcine Matar, MD;  Location: [facility];  Service: Urology;  Laterality: N/A;   ENDOSCOPIC RETROGRADE CHOLANGIOPANCREATOGRAPHY (ERCP) WITH PROPOFOL N/A 02/22/2022   Procedure: ENDOSCOPIC RETROGRADE CHOLANGIOPANCREATOGRAPHY (ERCP) WITH PROPOFOL;  Surgeon: Jeani Hawking, MD;  Location: [facility];  Service: Gastroenterology;  Laterality: N/A;   EYE SURGERY  Jan. 2016   cataract surgery both eyes   INGUINAL HERNIA REPAIR Right 11-08-2006   IR KYPHO EA ADDL LEVEL THORACIC OR LUMBAR  02/12/2021   IR RADIOLOGIST EVAL & MGMT  02/18/2021   MASS EXCISION N/A 03/03/2016   Procedure: EXCISION OF BACK  MASS;  Surgeon: Almond Lint, MD;  Location: [facility];  Service: General;  Laterality: N/A;   MOHS SURGERY Left 1/ 2016   Dr Margo Aye-- Basal cell   PROSTATE SURGERY     REMOVAL OF STONES  02/22/2022   Procedure: REMOVAL OF STONES;  Surgeon: Jeani Hawking, MD;  Location: [facility] ENDOSCOPY;  Service: Gastroenterology;;   Dennison Mascot  02/22/2022   Procedure: Dennison Mascot;  Surgeon: Elnoria Howard,  Luisa Hart, MD;  Location: [facility] ENDOSCOPY;  Service: Gastroenterology;;   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-C N/A 06/22/2021   Procedure: TRANSURETHRAL RESECTION OF BLADDER TUMOR;  Surgeon: Marcine Matar, MD;  Location: [facility];  Service: Urology;  Laterality: N/A;   TRANSURETHRAL RESECTION OF PROSTATE N/A 02/26/2013   Procedure: TRANSURETHRAL RESECTION OF THE PROSTATE WITH GYRUS INSTRUMENTS;  Surgeon: Marcine Matar, MD;  Location: [facility];  Service: Urology;  Laterality: N/A;   TRANSURETHRAL RESECTION OF PROSTATE N/A 06/22/2021    Procedure: TRANSURETHRAL RESECTION OF THE PROSTATE (TURP);  Surgeon: Marcine Matar, MD;  Location: [facility];  Service: Urology;  Laterality: N/A;   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   Insomnia    Prediabetes    Acute 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 (HCC)    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   Transaminitis 07/27/2021   UTI (urinary tract infection) 07/27/2021   CVA (cerebral vascular accident) (HCC) 07/27/2021   Fall 07/27/2021   Hyperglycemia 07/27/2021   Cholelithiasis 07/27/2021   Hypoxia 07/27/2021   Nausea and vomiting 07/27/2021   Acute metabolic encephalopathy 07/27/2021   Normocytic anemia 07/27/2021   Chronic back pain 07/27/2021   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   Closed fracture of first lumbar vertebra with routine healing 02/03/2021   Closed fracture of multiple ribs 11/18/2020   Anxiety 01/29/2020   Leg pain, bilateral 01/29/2020   Ingrown toenail 07/13/2019   Lumbar spondylosis 05/02/2018   Pain in left knee 03/09/2018   Osteoarthritis of left hip 01/16/2018   Trochanteric bursitis of left hip 01/16/2018   Preventative health care 09/26/2017   HTN (hypertension) 07/19/2015   Hyperlipidemia 07/19/2015   Great toe pain 02/11/2014   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   Obesity  (BMI 30-39.9) 06/25/2013   Renal insufficiency 06/25/2013   Weakness of left arm 06/25/2013   Sebaceous cyst 03/03/2011   Sprain of lumbar region 07/31/2010   Rib pain, left 08/29/2009   Carotid artery stenosis, asymptomatic, bilateral 05/02/2009   Eczema, atopic 05/31/2008   Vitamin D deficiency 03/01/2008   BPH (benign prostatic hyperplasia) 08/06/2007   Fasting hyperglycemia 12/21/2006   History of right MCA infarct 2006    ONSET DATE: 06/29/22  REFERRING DIAG:  Diagnosis  I63.9 (ICD-10-CM) - Cerebrovascular accident (CVA), unspecified mechanism (HCC)  W19.XXXD (ICD-10-CM) - Fall, subsequent encounter    THERAPY DIAG:  Muscle weakness (generalized)  Hemiplegia and hemiparesis following cerebral infarction affecting left non-dominant side (HCC)  Difficulty in walking, not elsewhere classified  Repeated falls  Rationale for Evaluation and Treatment: Rehabilitation  SUBJECTIVE:  SUBJECTIVE STATEMENT:Doing okay, reports having some good days and some bad. PERTINENT HISTORY:  ZAVIEN FRANCIONE is a 82 year old man with dementia, CKD, HTN, CAD, HLD and history of CVA  (2006, 2021), Parkinson's  PAIN:  Are you having pain? No  PRECAUTIONS: Fall  WEIGHT BEARING RESTRICTIONS: No  FALLS: Has patient fallen in last 6 months? No  LIVING ENVIRONMENT: Lives with: lives with their family and lives with their spouse Lives in: House/apartment Stairs:  1 brick high step Has following equipment at home: Environmental consultant - 2 wheeled, Wheelchair (manual), Shower bench, bed side commode, Grab bars, and hospital bed, sliding pad for car  PLOF: Independent with basic ADLs and Independent with household mobility without device  PATIENT GOALS: Walk around the block with his wife. Be able to use the dining room  chair rather than have to use the W/C. Increased I with bed mobility and simple tasks at home like make some coffee or brush his teeth, Step over tub to shower, has bench, but he can't really use it. Up and down the one step to get to back deck.  OBJECTIVE:   DIAGNOSTIC FINDINGS:  MRI 2021 with degenerative changes in lumbar spine, L3-4 R subarticular stenosis  COGNITION: Overall cognitive status: History of cognitive impairments - at baseline   SENSATION: Not tested  COORDINATION: Moderately impaired BLE   MUSCLE LENGTH: Hamstrings: severely restricted B Thomas test: Severely restricted B.  POSTURE: rounded shoulders, forward head, increased thoracic kyphosis, posterior pelvic tilt, and flexed trunk   LOWER EXTREMITY ROM:   BLE extremely stiff throughout, limited hip ROM in all planes, B knees limited in extension.   LOWER EXTREMITY MMT:  3+/5 throughout. Unable to determine accuracy of MMT due to cognition. Functionally demosntrates poor coordinated activation and poor muscular endurance.   BED MOBILITY: Min A for Supine<> Sit. Patient was using a ladder in his hospital bed and could move MI.  TRANSFERS: Min A, mod VC, tends to lean back.  CURB: Min A-Has one step out to his deck.  STAIRS: N/A   GAIT: Gait pattern: step to pattern, decreased arm swing- Right, decreased arm swing- Left, decreased step length- Right, decreased step length- Left, shuffling, festinating, trunk flexed, narrow BOS, poor foot clearance- Right, and poor foot clearance- Left Distance walked: 16' Assistive device utilized: Environmental consultant - 2 wheeled Level of assistance: Occasional min A Comments: mod VC to stay close to walker, take longer steps.  FUNCTIONAL TESTS:  5 times sit to stand: 38.23  09/29/22  could not performs 5XSTS Timed up and go (TUG): 47.91  with RW, 07/21/22 TUG 29  seconds without device, 09/29/22 25 seconds  TODAY'S TREATMENT:                                                                                                                                DATE:  10/25/21 Nustep level 5 x 6 mintues Folding towels from low surface  and then stacking towels up on a high surface at his side so he was having to turn, then we carried it and stacked it on a shelf 2.5# LAQ's 2.5# marches 2.5# hip abduction standing Green tband ankle PF/DF Green tband HS curls Standing with HHA cone toe touches Gait HHA with slight pull to get faster and bigger steps Volleyball in standing  10/21/22 Gait outside 1/2 parking [place] Folding towels from low surface to high surface with turn while standing, carry towels and stack Volley ball sitting and standing Ball kicks On mat table bridges, rolls, green tband clamshells, partial sit ups Got him in quadraped raise one arm and then leg, difficulty getting gout of this position Step and turn and touch in the pobars Gait again out to car  10/19/22 Walk outside half way around parking [place] and a rest, then around our building back through the front door,  Volley ball sitting and standing, ball kicks sitting and standing In pbars marching and hip abduction 12" toe clears Tband on floor working on longer steps Sit to stand elevated surface cues needed Stairs step over step up and down Gait outside again cues and CGA  10/13/22 Walk outside with wife and patient, had wife do the HHA and the CGA, gave her instruction in safety and verbal cues as well as tactile cues HS curls 20# 3x10 Leg extension 5# 3x10 Leg press 20# 2x10 5# single arm pulls 4" step to his left working on picking leg up and clearing Gait again 4 x 100 feet with me more in front of him and him trying to put his feet where I am, this seemed to help with the step length  10/11/22 Walk outside HHA on the right, cues for step length 2 rests gait was in the grass, on asphalt and up/dwn curbs Nustep level 5 x 6 minutes Volleyball in sitting Gait inside 100 feet with the right hand HHA  x 6 Side stepping, fwd and backward walking 2# marches 2# LAQ 2# hip flexion and abduction up onto a 6" step while sitting Sit to stand was difficult for him  10/06/22 STm to the left neck and upper trap area 2.5# LAQ 2.5# marches Red tband HS curls Red tband hip adduction Red tband hip abduction Red tband ankle exercises Nustep level 4 x 6 minutes Gait outside with HHA on the right 3x150'  09/29/22 Gait outside total 600 feet with 2 rests some walking in grass, uneven terrain See TUG, 5X STS above Worked on bed mobility and on our harder surfaces he is independent with in and out of bed and can position himself Volleyball  Side stepping, backward walking  09/27/22 Nustep level 5 x 5 minutes Gait outside halfway around the parking [place] then rest, then back to this building and a rest outsie and then in the grass around to the front door with CGA/HHA on the right and some cues for step length Standing in Pbars marching and hip abduction 2x10 each Fwd/bkwd walking in the Pbars Seated volleyball Standing kicks Direction changes Seated 6" clears to practice in and out of car Gait back out to the car  09/22/2022 Nustep level 5 x 5 minutes Bike level 5 x 6 minutes Gait outside 1/2 parking [place] then sit and rest and then back around, right HHA and use of gait belt Standing straight arm pulls 5# one at a time Sit to stand with weighted ball and a higher level seating surface Standing left leg 4"  toe clears Standing with right foot on 4" step for weight bearing and use of the left leg Standing and turning picking up cones and placing them other areas Folding laundry from one surface and turning and putting on another surface, needs some cues to use the left arm   09/20/22: NuStep L4 x 6 minutes Stair taps with opposite legs, BUE support, x 5 each, CGA Long steps across air ex planks placed horizontally, UUE support on rail of parallel bars, cues to face forward, support weight  with R hand on bar.  Repeat with side stepping each direction Practiced sit to stand in parallel bars, emphasize forward weight shift, able to rise with CGA Moved to mat to continue forward weight shifts and sit to stand, CGA Ambulated x 70', with CGA, x 3, in between sessions at ll bars.festinating gait . Seated forward trunk leans, with large therapeutic ball  2 sets 10 reps Seated forward rolls with ball , and press ups, partial standing , 2 sets 5 reps.  09/16/22 NuStep L4 x 6 minutes Functional re-assessment for PN-see goals Stair taps with opposite legs, BUE support, x 5 each, CGA Long steps across air ex planks placed horizontally, UUE support on rail of parallel bars. Repeat with side stepping each direction Standing weight shifts on air ex pad, no UE support, CGA, reluctant to weight shift forward and back. Practiced sit to stand in parallel bars, emphasize forward weight shift, able to rise with CGA Moved to mat to continue forward weight shifts and sit to stand, CGA Ambulated x 50', improved upright posture and foot clearance.  09/14/22 NuStep L5 x31mins UBE L2 forwards and back  Volleyball hits sitting 2x10 each hand  Leg ext 5# 2x10 HS curls10# 2x10 Ball squeezes 2x10 Hip abduction with green band 2x10  09/07/22 Nustep level 5 x 6 minutes  Tmill  MPH CGA x 1 minute Gait outside around the parking [place] with one rest break Stairs step over step In pbars marching and hip abduction Walking forward and backward in pbars Gait outside in the grass  09/02/22 Nustep level 5 x 5 minutes Walking HHA outside halfway around the parking [place] and then rest on table, then back in 2.5# on the left ankle having him sit and raise foot up and abduct to a 6" step on the left Bed mobility rolling side to side Bridges, partial sit ups Walk 300 feet with HHA a lot of cues for the step length Ball kicks Volley ball standing and sitting  PATIENT EDUCATION: Education details:  POC Person educated: Patient and Spouse Education method: Explanation Education comprehension: verbalized understanding  HOME EXERCISE PROGRAM: TBD  GOALS: Goals reviewed with patient? Yes  SHORT TERM GOALS: Target date: 07/19/22  I with initial HEP Baseline: Goal status: ongoing  2. Decrease 5 x STS to < 25 sec to demonstrate improved functional strength  Baseline: 38 sec  Goal status: 08/09/22 14 sec-met  3. Decrease TUG to < 35 sec to demonstrate improved functional balance  Baseline: 47 sec  Goal status: 15 seconds 09/29/22 met  4. Perform sit <> stand transfers from average chair height with S and VC to be able to use the swivel chair at his dining table.  Baseline: Min A  Goal status: 09/16/22-Inconsistently performs  with S from average height. At times requires CGA to cue forward weight shift. ongoing  LONG TERM GOALS: Target date: 09/29/22  Patient will be able to step over his tub at home using  grab bars for stability with CGA to access his shower. Baseline: Unable- has bench, but wife reports he steps over then sits. Goal status: 09/16/22-met  2.  Decrease 5 x STS to < 18 sec to demonstrate improved functional strength Baseline:  Goal status: not met 62 seconds and able to do only 3 09/29/22  3.  Decrease TUG to < 25 sec to demonstrate improved functional balance Baseline:  Goal status: 09/29/22 15 seconds  4.  Patient will perform his bed mobility with MI. Baseline: Min A Goal status: on hard surface mat he is independent but on soft bed at home he struggles  5.  Patient will ambulate at least 400' with LRAD, CGA, demonstrate improved upright posture, longer step length. Baseline:  Goal status: 09/16/22-Amb >400', but began to fatigue after about 300' with more flexed posture, L foot shuffling more, unable to maintain safe technique. ongoing  6.  Patient will manage up and down a single step with LRAD, CGA to access his deck at home. Baseline: Unable Goal status:  09/16/22-Up and down 1 step with U rail. met  ASSESSMENT:  CLINICAL IMPRESSION: Patient does very well when he has a task to do, the towels he tends to have a focus and does well with minimal cues, does need CGA for safety and balance.  Today was a busier day in our gym and when in the gynm he would get very distracted, so we did much of the treatment in a single room with minimal distractions, this way he stays on task.  Today he was doing a little more of the shuffling gait but I did much less hands on and really did SBA and not the HHA for walking OBJECTIVE IMPAIRMENTS: Abnormal gait, decreased activity tolerance, decreased balance, decreased cognition, decreased coordination, decreased endurance, decreased mobility, difficulty walking, decreased ROM, decreased strength, decreased safety awareness, impaired flexibility, impaired UE functional use, improper body mechanics, and postural dysfunction.   ACTIVITY LIMITATIONS: carrying, lifting, bending, sitting, standing, squatting, stairs, transfers, bed mobility, bathing, toileting, dressing, hygiene/grooming, and locomotion level  PARTICIPATION LIMITATIONS: meal prep, cleaning, laundry, interpersonal relationship, shopping, and community activity  PERSONAL FACTORS: Age, Past/current experiences, and 3+ comorbidities: CVA, Parkinson's traits, dementia  are also affecting patient's functional outcome.   REHAB POTENTIAL: Good  CLINICAL DECISION MAKING: Evolving/moderate complexity  EVALUATION COMPLEXITY: Moderate  PLAN:  PT FREQUENCY: 2x/week  PT DURATION: 12 weeks  PLANNED INTERVENTIONS: Therapeutic exercises, Therapeutic activity, Neuromuscular re-education, Balance training, Gait training, Patient/Family education, Self Care, Joint mobilization, Stair training, Moist heat, and Manual therapy  PLAN FOR NEXT SESSION:Continue to work on his balance and function Stacie Glaze, PT 10/26/22 11:02 AM

## 2022-10-27 ENCOUNTER — Ambulatory Visit: Payer: PPO | Admitting: Occupational Therapy

## 2022-10-27 ENCOUNTER — Other Ambulatory Visit (HOSPITAL_BASED_OUTPATIENT_CLINIC_OR_DEPARTMENT_OTHER): Payer: Self-pay

## 2022-10-27 DIAGNOSIS — I69354 Hemiplegia and hemiparesis following cerebral infarction affecting left non-dominant side: Secondary | ICD-10-CM

## 2022-10-27 DIAGNOSIS — R41840 Attention and concentration deficit: Secondary | ICD-10-CM

## 2022-10-27 DIAGNOSIS — R278 Other lack of coordination: Secondary | ICD-10-CM

## 2022-10-27 DIAGNOSIS — R2689 Other abnormalities of gait and mobility: Secondary | ICD-10-CM

## 2022-10-27 DIAGNOSIS — M6281 Muscle weakness (generalized): Secondary | ICD-10-CM

## 2022-10-27 DIAGNOSIS — I69318 Other symptoms and signs involving cognitive functions following cerebral infarction: Secondary | ICD-10-CM

## 2022-10-28 ENCOUNTER — Other Ambulatory Visit (HOSPITAL_BASED_OUTPATIENT_CLINIC_OR_DEPARTMENT_OTHER): Payer: Self-pay

## 2022-10-28 ENCOUNTER — Ambulatory Visit: Payer: PPO | Admitting: Physical Therapy

## 2022-10-28 DIAGNOSIS — D631 Anemia in chronic kidney disease: Secondary | ICD-10-CM | POA: Diagnosis not present

## 2022-10-28 DIAGNOSIS — N189 Chronic kidney disease, unspecified: Secondary | ICD-10-CM | POA: Diagnosis not present

## 2022-10-28 DIAGNOSIS — I129 Hypertensive chronic kidney disease with stage 1 through stage 4 chronic kidney disease, or unspecified chronic kidney disease: Secondary | ICD-10-CM | POA: Diagnosis not present

## 2022-10-28 DIAGNOSIS — R41840 Attention and concentration deficit: Secondary | ICD-10-CM

## 2022-10-28 DIAGNOSIS — I679 Cerebrovascular disease, unspecified: Secondary | ICD-10-CM | POA: Diagnosis not present

## 2022-10-28 DIAGNOSIS — N1832 Chronic kidney disease, stage 3b: Secondary | ICD-10-CM | POA: Diagnosis not present

## 2022-10-28 DIAGNOSIS — I69354 Hemiplegia and hemiparesis following cerebral infarction affecting left non-dominant side: Secondary | ICD-10-CM

## 2022-10-28 DIAGNOSIS — R278 Other lack of coordination: Secondary | ICD-10-CM

## 2022-10-28 DIAGNOSIS — M6281 Muscle weakness (generalized): Secondary | ICD-10-CM | POA: Diagnosis not present

## 2022-10-28 DIAGNOSIS — N400 Enlarged prostate without lower urinary tract symptoms: Secondary | ICD-10-CM | POA: Diagnosis not present

## 2022-10-28 DIAGNOSIS — E559 Vitamin D deficiency, unspecified: Secondary | ICD-10-CM | POA: Diagnosis not present

## 2022-10-28 NOTE — Therapy (Signed)
OUTPATIENT PHYSICAL THERAPY NEURO Treatment   Patient Name: William [name] MRN: [id number] DOB:Dec 15, 1940, 82 y.o., male Today's Date: 10/28/2022  PCP: Donato Schultz  DO REFERRING PROVIDER: same  END OF SESSION:  PT End of Session - 10/28/22 1709     Visit Number [id number]    Date for PT Re-Evaluation 10/30/22    PT Start Time 1700    PT Stop Time 1742    PT Time Calculation (min) 42 min                 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   HTN (hypertension) 07/19/2015   Hyperlipidemia    Major neurocognitive disorder 01/09/2014   Mild, related to stroke history   Nocturia    Renal insufficiency 06/25/2013   S/P carotid endarterectomy    BILATERAL ICA--  PATENT PER DUPLEX  05-19-2012   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/26/2017   Right calf   Urinary frequency    Vitamin D deficiency    Past Surgical History:  Procedure Laterality Date   APPENDECTOMY  AS CHILD   CARDIOVASCULAR STRESS TEST  03-27-2012  DR CRENSHAW   LOW RISK LEXISCAN STUDY-- PROBABLE NORMAL PERFUSION AND SOFT TISSUE ATTENUATION/  NO ISCHEMIA/ EF 51%   CAROTID ENDARTERECTOMY Bilateral LEFT  11-12-2008  DR GREG HAYES   RIGHT ICA  2006  ([facility])   CHOLECYSTECTOMY N/A 02/23/2022   Procedure: LAPAROSCOPIC CHOLECYSTECTOMY;  Surgeon: Quentin Ore, MD;  Location: [facility] OR;  Service: General;  Laterality: N/A;   CYSTOSCOPY W/ RETROGRADES Bilateral 06/22/2021   Procedure: CYSTOSCOPY WITH RETROGRADE PYELOGRAM;  Surgeon: Marcine Matar, MD;  Location: [facility];   Service: Urology;  Laterality: Bilateral;   CYSTOSCOPY WITH LITHOLAPAXY N/A 02/26/2013   Procedure: CYSTOSCOPY WITH LITHOLAPAXY;  Surgeon: Marcine Matar, MD;  Location: [facility];  Service: Urology;  Laterality: N/A;   ENDOSCOPIC RETROGRADE CHOLANGIOPANCREATOGRAPHY (ERCP) WITH PROPOFOL N/A 02/22/2022   Procedure: ENDOSCOPIC RETROGRADE CHOLANGIOPANCREATOGRAPHY (ERCP) WITH PROPOFOL;  Surgeon: Jeani Hawking, MD;  Location: [facility] ENDOSCOPY;  Service: Gastroenterology;  Laterality: N/A;   EYE SURGERY  Jan. 2016   cataract surgery both eyes   INGUINAL HERNIA REPAIR Right 11-08-2006   IR KYPHO EA ADDL LEVEL THORACIC OR LUMBAR  02/12/2021   IR RADIOLOGIST EVAL & MGMT  02/18/2021   MASS EXCISION N/A 03/03/2016   Procedure: EXCISION OF BACK  MASS;  Surgeon: Almond Lint, MD;  Location: [facility];  Service: General;  Laterality: N/A;   MOHS SURGERY Left 1/ 2016   Dr Margo Aye-- Basal cell   PROSTATE SURGERY     REMOVAL OF STONES  02/22/2022   Procedure: REMOVAL OF STONES;  Surgeon: Jeani Hawking, MD;  Location: [facility] ENDOSCOPY;  Service: Gastroenterology;;   Dennison Mascot  02/22/2022   Procedure: Dennison Mascot;  Surgeon: Jeani Hawking, MD;  Location: [facility] ENDOSCOPY;  Service: Gastroenterology;;   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-C N/A 06/22/2021   Procedure: TRANSURETHRAL RESECTION OF BLADDER TUMOR;  Surgeon: Marcine Matar, MD;  Location: [facility];  Service: Urology;  Laterality: N/A;   TRANSURETHRAL RESECTION OF PROSTATE N/A 02/26/2013   Procedure: TRANSURETHRAL RESECTION OF THE PROSTATE WITH GYRUS INSTRUMENTS;  Surgeon: Marcine Matar, MD;  Location: [facility];  Service: Urology;  Laterality: N/A;   TRANSURETHRAL RESECTION OF PROSTATE N/A 06/22/2021   Procedure: TRANSURETHRAL RESECTION OF THE PROSTATE (TURP);  Surgeon: Marcine Matar, MD;  Location: [facility];  Service: Urology;  Laterality: N/A;    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   Insomnia    Prediabetes    Acute 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 (HCC)    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   Transaminitis 07/27/2021   UTI (urinary tract infection) 07/27/2021   CVA (cerebral vascular accident) (HCC) 07/27/2021   Fall 07/27/2021   Hyperglycemia 07/27/2021   Cholelithiasis 07/27/2021   Hypoxia 07/27/2021   Nausea and vomiting 07/27/2021   Acute metabolic encephalopathy 07/27/2021   Normocytic anemia 07/27/2021   Chronic back pain 07/27/2021   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   Closed fracture of first lumbar vertebra with routine healing 02/03/2021   Closed fracture of multiple ribs 11/18/2020   Anxiety 01/29/2020   Leg pain, bilateral 01/29/2020   Ingrown toenail 07/13/2019   Lumbar spondylosis 05/02/2018   Pain in left knee 03/09/2018   Osteoarthritis of left hip 01/16/2018   Trochanteric bursitis of left hip 01/16/2018   Preventative health care 09/26/2017   HTN (hypertension) 07/19/2015   Hyperlipidemia 07/19/2015   Great toe pain 02/11/2014   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   Obesity (BMI 30-39.9) 06/25/2013   Renal insufficiency 06/25/2013   Weakness of left arm 06/25/2013   Sebaceous cyst 03/03/2011   Sprain of lumbar region 07/31/2010   Rib  pain, left 08/29/2009   Carotid artery stenosis, asymptomatic, bilateral 05/02/2009   Eczema, atopic 05/31/2008   Vitamin D deficiency 03/01/2008   BPH (benign prostatic hyperplasia) 08/06/2007   Fasting hyperglycemia 12/21/2006   History of right MCA infarct 2006    ONSET DATE: 06/29/22  REFERRING DIAG:  Diagnosis  I63.9 (ICD-10-CM) - Cerebrovascular accident (CVA), unspecified mechanism (HCC)  W19.XXXD (ICD-10-CM) - Fall, subsequent encounter    THERAPY DIAG:  Muscle weakness (generalized)  Hemiplegia and hemiparesis following cerebral infarction affecting left non-dominant side (HCC)  Other lack of coordination  Attention and concentration deficit  Rationale for Evaluation and Treatment: Rehabilitation  SUBJECTIVE:  SUBJECTIVE STATEMENT:per wife t kidney MD this morning and did a lot of walking. Changed sleeping meds PERTINENT HISTORY:  EVA MOODIE is a 82 year old man with dementia, CKD, HTN, CAD, HLD and history of CVA  (2006, 2021), Parkinson's  PAIN:  Are you having pain? No  PRECAUTIONS: Fall  WEIGHT BEARING RESTRICTIONS: No  FALLS: Has patient fallen in last 6 months? No  LIVING ENVIRONMENT: Lives with: lives with their family and lives with their spouse Lives in: House/apartment Stairs:  1 brick high step Has following equipment at home: Environmental consultant - 2 wheeled, Wheelchair (manual), Shower bench, bed side commode, Grab bars, and hospital bed, sliding pad for car  PLOF: Independent with basic ADLs and Independent with household mobility without device  PATIENT GOALS: Walk around the block with his wife. Be able to use the dining room chair rather than have to use the W/C. Increased I with bed mobility and simple tasks at home like make some coffee or brush his teeth, Step over  tub to shower, has bench, but he can't really use it. Up and down the one step to get to back deck.  OBJECTIVE:   DIAGNOSTIC FINDINGS:  MRI 2021 with degenerative changes in lumbar spine, L3-4 R subarticular stenosis  COGNITION: Overall cognitive status: History of cognitive impairments - at baseline   SENSATION: Not tested  COORDINATION: Moderately impaired BLE   MUSCLE LENGTH: Hamstrings: severely restricted B Thomas test: Severely restricted B.  POSTURE: rounded shoulders, forward head, increased thoracic kyphosis, posterior pelvic tilt, and flexed trunk   LOWER EXTREMITY ROM:   BLE extremely stiff throughout, limited hip ROM in all planes, B knees limited in extension.   LOWER EXTREMITY MMT:  3+/5 throughout. Unable to determine accuracy of MMT due to cognition. Functionally demosntrates poor coordinated activation and poor muscular endurance.   BED MOBILITY: Min A for Supine<> Sit. Patient was using a ladder in his hospital bed and could move MI.  TRANSFERS: Min A, mod VC, tends to lean back.  CURB: Min A-Has one step out to his deck.  STAIRS: N/A   GAIT: Gait pattern: step to pattern, decreased arm swing- Right, decreased arm swing- Left, decreased step length- Right, decreased step length- Left, shuffling, festinating, trunk flexed, narrow BOS, poor foot clearance- Right, and poor foot clearance- Left Distance walked: 67' Assistive device utilized: Environmental consultant - 2 wheeled Level of assistance: Occasional min A Comments: mod VC to stay close to walker, take longer steps.  FUNCTIONAL TESTS:  5 times sit to stand: 38.23  09/29/22  could not performs 5XSTS Timed up and go (TUG): 47.91  with RW, 07/21/22 TUG 29  seconds without device, 09/29/22 25 seconds  TODAY'S TREATMENT:                                                                                                                               DATE:   10/28/22 Amb outside with gait belt  and HHA with gentle pull to  work on increased cadence and step thru- stressing with shuffled steps and leaning fwd Walking in clinic looking for cones  and working on scanning, turns and navigating obstacles  Ball tap standing and sitting balloon taps Obstacle course navigation over and around- min A with cuing for safety to get closer before stepping on or over HS curl 2 sets 10 20# Knee ext 2 sets 10 10# Alt taps 6 inch 20 x 2 sets CGA Ball toss working on 2 hand catch STS with wt ball press working on 2 hands slightly elevated mat  10/25/21 Nustep level 5 x 6 mintues Folding towels from low surface and then stacking towels up on a high surface at his side so he was having to turn, then we carried it and stacked it on a shelf 2.5# LAQ's 2.5# marches 2.5# hip abduction standing Green tband ankle PF/DF Green tband HS curls Standing with HHA cone toe touches Gait HHA with slight pull to get faster and bigger steps Volleyball in standing  10/21/22 Gait outside 1/2 parking [place] Folding towels from low surface to high surface with turn while standing, carry towels and stack Volley ball sitting and standing Ball kicks On mat table bridges, rolls, green tband clamshells, partial sit ups Got him in quadraped raise one arm and then leg, difficulty getting gout of this position Step and turn and touch in the pobars Gait again out to car  10/19/22 Walk outside half way around parking [place] and a rest, then around our building back through the front door,  Volley ball sitting and standing, ball kicks sitting and standing In pbars marching and hip abduction 12" toe clears Tband on floor working on longer steps Sit to stand elevated surface cues needed Stairs step over step up and down Gait outside again cues and CGA  10/13/22 Walk outside with wife and patient, had wife do the HHA and the CGA, gave her instruction in safety and verbal cues as well as tactile cues HS curls 20# 3x10 Leg extension 5# 3x10 Leg press  20# 2x10 5# single arm pulls 4" step to his left working on picking leg up and clearing Gait again 4 x 100 feet with me more in front of him and him trying to put his feet where I am, this seemed to help with the step length  10/11/22 Walk outside HHA on the right, cues for step length 2 rests gait was in the grass, on asphalt and up/dwn curbs Nustep level 5 x 6 minutes Volleyball in sitting Gait inside 100 feet with the right hand HHA x 6 Side stepping, fwd and backward walking 2# marches 2# LAQ 2# hip flexion and abduction up onto a 6" step while sitting Sit to stand was difficult for him  10/06/22 STm to the left neck and upper trap area 2.5# LAQ 2.5# marches Red tband HS curls Red tband hip adduction Red tband hip abduction Red tband ankle exercises Nustep level 4 x 6 minutes Gait outside with HHA on the right 3x150'  09/29/22 Gait outside total 600 feet with 2 rests some walking in grass, uneven terrain See TUG, 5X STS above Worked on bed mobility and on our harder surfaces he is independent with in and out of bed and can position himself Volleyball  Side stepping, backward walking  09/27/22 Nustep level 5 x 5 minutes Gait outside halfway around the parking [place] then rest, then back to this building and a rest  outsie and then in the grass around to the front door with CGA/HHA on the right and some cues for step length Standing in Pbars marching and hip abduction 2x10 each Fwd/bkwd walking in the Pbars Seated volleyball Standing kicks Direction changes Seated 6" clears to practice in and out of car Gait back out to the car  09/22/2022 Nustep level 5 x 5 minutes Bike level 5 x 6 minutes Gait outside 1/2 parking [place] then sit and rest and then back around, right HHA and use of gait belt Standing straight arm pulls 5# one at a time Sit to stand with weighted ball and a higher level seating surface Standing left leg 4" toe clears Standing with right foot on 4" step  for weight bearing and use of the left leg Standing and turning picking up cones and placing them other areas Folding laundry from one surface and turning and putting on another surface, needs some cues to use the left arm   09/20/22: NuStep L4 x 6 minutes Stair taps with opposite legs, BUE support, x 5 each, CGA Long steps across air ex planks placed horizontally, UUE support on rail of parallel bars, cues to face forward, support weight with R hand on bar.  Repeat with side stepping each direction Practiced sit to stand in parallel bars, emphasize forward weight shift, able to rise with CGA Moved to mat to continue forward weight shifts and sit to stand, CGA Ambulated x 70', with CGA, x 3, in between sessions at ll bars.festinating gait . Seated forward trunk leans, with large therapeutic ball  2 sets 10 reps Seated forward rolls with ball , and press ups, partial standing , 2 sets 5 reps.  09/16/22 NuStep L4 x 6 minutes Functional re-assessment for PN-see goals Stair taps with opposite legs, BUE support, x 5 each, CGA Long steps across air ex planks placed horizontally, UUE support on rail of parallel bars. Repeat with side stepping each direction Standing weight shifts on air ex pad, no UE support, CGA, reluctant to weight shift forward and back. Practiced sit to stand in parallel bars, emphasize forward weight shift, able to rise with CGA Moved to mat to continue forward weight shifts and sit to stand, CGA Ambulated x 50', improved upright posture and foot clearance.  09/14/22 NuStep L5 x68mins UBE L2 forwards and back  Volleyball hits sitting 2x10 each hand  Leg ext 5# 2x10 HS curls10# 2x10 Ball squeezes 2x10 Hip abduction with green band 2x10  09/07/22 Nustep level 5 x 6 minutes  Tmill  MPH CGA x 1 minute Gait outside around the parking [place] with one rest break Stairs step over step In pbars marching and hip abduction Walking forward and backward in pbars Gait  outside in the grass  09/02/22 Nustep level 5 x 5 minutes Walking HHA outside halfway around the parking [place] and then rest on table, then back in 2.5# on the left ankle having him sit and raise foot up and abduct to a 6" step on the left Bed mobility rolling side to side Bridges, partial sit ups Walk 300 feet with HHA a lot of cues for the step length Ball kicks Volley ball standing and sitting  PATIENT EDUCATION: Education details: POC Person educated: Patient and Spouse Education method: Explanation Education comprehension: verbalized understanding  HOME EXERCISE PROGRAM: TBD  GOALS: Goals reviewed with patient? Yes  SHORT TERM GOALS: Target date: 07/19/22  I with initial HEP Baseline: Goal status: ongoing  2. Decrease 5  x STS to < 25 sec to demonstrate improved functional strength  Baseline: 38 sec  Goal status: 08/09/22 14 sec-met  3. Decrease TUG to < 35 sec to demonstrate improved functional balance  Baseline: 47 sec  Goal status: 15 seconds 09/29/22 met  4. Perform sit <> stand transfers from average chair height with S and VC to be able to use the swivel chair at his dining table.  Baseline: Min A  Goal status: 09/16/22-Inconsistently performs  with S from average height. At times requires CGA to cue forward weight shift. ongoing  LONG TERM GOALS: Target date: 09/29/22  Patient will be able to step over his tub at home using grab bars for stability with CGA to access his shower. Baseline: Unable- has bench, but wife reports he steps over then sits. Goal status: 09/16/22-met  2.  Decrease 5 x STS to < 18 sec to demonstrate improved functional strength Baseline:  Goal status: not met 62 seconds and able to do only 3 09/29/22  3.  Decrease TUG to < 25 sec to demonstrate improved functional balance Baseline:  Goal status: 09/29/22 15 seconds  4.  Patient will perform his bed mobility with MI. Baseline: Min A Goal status: on hard surface mat he is independent but  on soft bed at home he struggles  5.  Patient will ambulate at least 400' with LRAD, CGA, demonstrate improved upright posture, longer step length. Baseline:  Goal status: 09/16/22-Amb >400', but began to fatigue after about 300' with more flexed posture, L foot shuffling more, unable to maintain safe technique. ongoing  6.  Patient will manage up and down a single step with LRAD, CGA to access his deck at home. Baseline: Unable Goal status: 09/16/22-Up and down 1 step with U rail. met  ASSESSMENT:  CLINICAL IMPRESSION: gait outside but pt really struggling with taking longer stride and shuffling leaning fwd. Did well in clinic walking around looking for cones navigating obstacles and turns, but with obstacle course stepping over did not get close enough with step and was unsafe. Cont with balance and strength. Assistance and cuing needed. OBJECTIVE IMPAIRMENTS: Abnormal gait, decreased activity tolerance, decreased balance, decreased cognition, decreased coordination, decreased endurance, decreased mobility, difficulty walking, decreased ROM, decreased strength, decreased safety awareness, impaired flexibility, impaired UE functional use, improper body mechanics, and postural dysfunction.   ACTIVITY LIMITATIONS: carrying, lifting, bending, sitting, standing, squatting, stairs, transfers, bed mobility, bathing, toileting, dressing, hygiene/grooming, and locomotion level  PARTICIPATION LIMITATIONS: meal prep, cleaning, laundry, interpersonal relationship, shopping, and community activity  PERSONAL FACTORS: Age, Past/current experiences, and 3+ comorbidities: CVA, Parkinson's traits, dementia  are also affecting patient's functional outcome.   REHAB POTENTIAL: Good  CLINICAL DECISION MAKING: Evolving/moderate complexity  EVALUATION COMPLEXITY: Moderate  PLAN:  PT FREQUENCY: 2x/week  PT DURATION: 12 weeks  PLANNED INTERVENTIONS: Therapeutic exercises, Therapeutic activity, Neuromuscular  re-education, Balance training, Gait training, Patient/Family education, Self Care, Joint mobilization, Stair training, Moist heat, and Manual therapy  PLAN FOR NEXT SESSION:Continue to work on his balance and function   Patient Details  Name: William [name] MRN: [id number] Date of Birth: Apr 04, 1941 Referring Provider:  Donato Schultz, *  Encounter Date: 10/28/2022   Suanne Marker, PTA 10/28/2022, 5:46 PM  [facility] [facility]. [place], [place], [address] Phone: [phone]   Fax:  [phone]

## 2022-10-28 NOTE — Therapy (Signed)
OUTPATIENT OCCUPATIONAL THERAPY TREATMENT NOTE  Patient Name: William [name] MRN: [id number] DOB:05-Dec-1940, 82 y.o., male Today's Date: 10/28/2022  PCP & REFERRING PROVIDER:  [name], [name], DO    END OF SESSION:  OT End of Session - 10/28/22 0821     Visit Number 27    Number of Visits 32    Date for OT Re-Evaluation 11/16/22    Authorization Type [employer]    Authorization - Visit Number 27    Progress Note Due on Visit 30    OT Start Time 1533    OT Stop Time 1615    OT Time Calculation (min) 42 min    Behavior During Therapy WFL for tasks assessed/performed                            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   HTN (hypertension) 07/19/2015   Hyperlipidemia    Major neurocognitive disorder 01/09/2014   Mild, related to stroke history   Nocturia    Renal insufficiency 06/25/2013   S/P carotid endarterectomy    BILATERAL ICA--  PATENT PER DUPLEX  05-19-2012   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/26/2017   Right calf   Urinary frequency    Vitamin D deficiency    Past Surgical History:  Procedure Laterality Date   APPENDECTOMY  AS CHILD   CARDIOVASCULAR STRESS TEST  03-27-2012  DR CRENSHAW   LOW RISK LEXISCAN STUDY-- PROBABLE NORMAL PERFUSION AND SOFT TISSUE ATTENUATION/  NO ISCHEMIA/ EF 51%   CAROTID ENDARTERECTOMY Bilateral LEFT  11-12-2008  DR GREG HAYES   RIGHT ICA  2006  ([facility])   CHOLECYSTECTOMY N/A 02/23/2022   Procedure: LAPAROSCOPIC CHOLECYSTECTOMY;  Surgeon: Quentin Ore, MD;   Location: [facility];  Service: General;  Laterality: N/A;   CYSTOSCOPY W/ RETROGRADES Bilateral 06/22/2021   Procedure: CYSTOSCOPY WITH RETROGRADE PYELOGRAM;  Surgeon: Marcine Matar, MD;  Location: [facility] [place];  Service: Urology;  Laterality: Bilateral;   CYSTOSCOPY WITH LITHOLAPAXY N/A 02/26/2013   Procedure: CYSTOSCOPY WITH LITHOLAPAXY;  Surgeon: Marcine Matar, MD;  Location: [facility];  Service: Urology;  Laterality: N/A;   ENDOSCOPIC RETROGRADE CHOLANGIOPANCREATOGRAPHY (ERCP) WITH PROPOFOL N/A 02/22/2022   Procedure: ENDOSCOPIC RETROGRADE CHOLANGIOPANCREATOGRAPHY (ERCP) WITH PROPOFOL;  Surgeon: Jeani Hawking, MD;  Location: [facility];  Service: Gastroenterology;  Laterality: N/A;   EYE SURGERY  Jan. 2016   cataract surgery both eyes   INGUINAL HERNIA REPAIR Right 11-08-2006   IR KYPHO EA ADDL LEVEL THORACIC OR LUMBAR  02/12/2021   IR RADIOLOGIST EVAL & MGMT  02/18/2021   MASS EXCISION N/A 03/03/2016   Procedure: EXCISION OF BACK  MASS;  Surgeon: Almond Lint, MD;  Location: [facility];  Service: General;  Laterality: N/A;   MOHS SURGERY Left 1/ 2016   Dr Margo Aye-- Basal cell   PROSTATE SURGERY     REMOVAL OF STONES  02/22/2022   Procedure: REMOVAL  OF STONES;  Surgeon: Jeani Hawking, MD;  Location: [facility] ENDOSCOPY;  Service: Gastroenterology;;   Dennison Mascot  02/22/2022   Procedure: Dennison Mascot;  Surgeon: Jeani Hawking, MD;  Location: [facility] ENDOSCOPY;  Service: Gastroenterology;;   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-C N/A 06/22/2021   Procedure: TRANSURETHRAL RESECTION OF BLADDER TUMOR;  Surgeon: Marcine Matar, MD;  Location: [facility];  Service: Urology;  Laterality: N/A;   TRANSURETHRAL RESECTION OF PROSTATE N/A 02/26/2013   Procedure: TRANSURETHRAL RESECTION OF THE PROSTATE WITH GYRUS INSTRUMENTS;  Surgeon: Marcine Matar, MD;  Location: [facility];  Service: Urology;  Laterality: N/A;    TRANSURETHRAL RESECTION OF PROSTATE N/A 06/22/2021   Procedure: TRANSURETHRAL RESECTION OF THE PROSTATE (TURP);  Surgeon: Marcine Matar, MD;  Location: [facility];  Service: Urology;  Laterality: N/A;   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   Insomnia    Prediabetes    Acute 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 (HCC)    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   Transaminitis 07/27/2021   UTI (urinary tract infection) 07/27/2021   CVA (cerebral vascular accident) (HCC) 07/27/2021   Fall 07/27/2021   Hyperglycemia 07/27/2021   Cholelithiasis 07/27/2021   Hypoxia 07/27/2021   Nausea and vomiting 07/27/2021   Acute metabolic encephalopathy 07/27/2021   Normocytic anemia 07/27/2021   Chronic back pain 07/27/2021   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   Closed fracture of first lumbar vertebra with routine healing 02/03/2021   Closed fracture of multiple ribs 11/18/2020   Anxiety 01/29/2020   Leg pain, bilateral 01/29/2020   Ingrown toenail 07/13/2019   Lumbar spondylosis 05/02/2018   Pain in left knee 03/09/2018   Osteoarthritis of left hip 01/16/2018   Trochanteric bursitis of left hip 01/16/2018   Preventative health care 09/26/2017   HTN (hypertension) 07/19/2015   Hyperlipidemia 07/19/2015   Great toe pain 02/11/2014   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   Obesity (BMI 30-39.9) 06/25/2013   Renal insufficiency 06/25/2013   Weakness of left arm 06/25/2013   Sebaceous cyst 03/03/2011   Sprain of lumbar region 07/31/2010   Rib pain, left 08/29/2009   Carotid artery stenosis, asymptomatic, bilateral 05/02/2009   Eczema, atopic 05/31/2008   Vitamin D deficiency 03/01/2008   BPH (benign prostatic hyperplasia) 08/06/2007   Fasting hyperglycemia 12/21/2006   History of right MCA infarct 2006    ONSET DATE: Acute exacerbation of chronic symptoms (most recent hospitalization 02/26/22 - 03/12/22, followed by SNF and home health)   REFERRING DIAG:  I63.9 (ICD-10-CM) - Cerebrovascular accident (CVA), unspecified mechanism (HCC)  W19.XXXD (ICD-10-CM) - Fall, subsequent encounter    THERAPY DIAG:  Muscle weakness (generalized)  Hemiplegia and hemiparesis following cerebral infarction affecting left non-dominant side (HCC)  Other lack of coordination  Attention and concentration deficit  Other symptoms and signs involving cognitive functions following cerebral infarction  Other  abnormalities of gait and mobility  Rationale for Evaluation and Treatment: Rehabilitation  PERTINENT HISTORY: PMHx includes L basal ganglia and corona radiata infarct 07/27/21, L lower homonymous quadrantanopia; R MCA infarct in 2006 w/ L-sided hemiparesis, NCD, carotid artery disease (bilateral), HTN, HLD,CKD3, vascular dementia, and hx of bladder cancer. He has had falls in 2023, fracturing his clavicle in August, then he had stomach surgery in Sept 2023. He has He needs assist at baseline for ADLs. When last documented in acute rehab after having his gall-bladder removed, (03/09/22) he required moderate to maximum assist x2 for most ADLs (more assist for ADLs the require standing), and they recommended rehab in skilled nursing setting. He apparently got Covid at some point and also attended a SNF. Once he D/C home, he had some  episodes of combativeness at night  with "sun-downing."  He is communicative and appropriate, though appears easily confused and distracted, not fully oriented to time and place (Ax2, states date is 26 and cannot say what city he lives in). His wife provides most details, and he is participative, though a poor historian at times. In a nut shell- he has been suffering from dementia and post-stroke effects for years, and his issues were recently exacerbated in Sept of last year after abdominal surgery. He has less use of Lt "stroke side" and new, ritualistic "ticks" in mouth and Rt hand/arm.  Wife states that they do have a caregiver come M, W, F for 4 hours in the middle of the day to mainly help him bathe and dress, which usually tires him out. He has been more combative towards the end of the day as well.   PRECAUTIONS: Fall and Other: Lt hemi, poor cognition, LATEX and adhesive allergies, etc.  ;WEIGHT BEARING RESTRICTIONS: No   SUBJECTIVE:   SUBJECTIVE STATEMENT: Denies pain Pt accompanied by:  his wife   PAIN: no     FALLS: Has patient fallen in last 6 months? Yes. Number of falls at least 1 resulting in clavicle fx (now healed)   LIVING ENVIRONMENT: Lives with: lives with their spouse Lives in: House/apartment Has following equipment at home: manual w/c, 2 w/w, weighted utensils, shower bench (for tub), bed cane, hospital bed, raised toilet, and possibly more  PLOF: Needs assistance with ADLs, Needs assistance with homemaking, Needs assistance with gait, and Needs assistance with transfers  PATIENT GOALS: at eval- increase personal hygiene skills (brush teeth, shaving)  prepare instant coffee or microwave meal improve functional cognitive skills to play games, etc.,   increase Lt arm usage try to help decrease Rt hand/arm perseveration (flicking, tapping, noise making) Improve fnl tranfers in/out of multiple seats (tub bench, swivel chair, etc.)    OBJECTIVE: (All objective  assessments below are from initial evaluation on: 07/07/22 unless otherwise specified.)   HAND DOMINANCE: Right  ADLs: Overall ADLs: Wife states decreased ability since hospitalization in Sept 23.  Transfers/ambulation related to ADLs: Min A transfer, CGA for mobility Eating: set up Grooming: Mod A- needs assist with oral care, shaving, but can comb his hair UB Dressing: Min A  LB Dressing: Dep  Toileting:  Mod  Bathing: Mod  Tub Shower transfers: Min A  Equipment: Emergency planning/management officer, Grab bars, and Feeding equipment  IADLs: completely dependent for years now, except some ability to use microwave which is now not possible after hospitalization   MOBILITY STATUS: Needs Assist: CGA - Min A, Hx of falls, Festination, difficulty with turns, and neglect on Lt side  POSTURE COMMENTS:  rounded shoulders, forward head, and weight shift left  ACTIVITY TOLERANCE: Activity tolerance: he became tired and somewhat agitated by the end of the OT evaluation today (after PT and OT about 2.5hours with mostly sedate activities in seated positions)  FUNCTIONAL OUTCOME MEASURES: [facility] 17/30  showing problems with visuospatial/executive function, attention, fluency, and orientation. (at least 26/30 is needed to be considered "normal")  [employer] in ADLs: 1/6  (very dependent)  UPPER EXTREMITY ROM:    Active ROM Right eval Left eval  Shoulder scaption 100 85  Shoulder abduction    Shoulder adduction    Shoulder extension    Shoulder internal rotation Reaches small of back Reaches to hip  Shoulder external rotation Reaches back of head  Reaches up to ear  Elbow flexion 150 130  Elbow extension (-10*)  (-35*)  Wrist flexion 35 20  Wrist extension 50 10  (Blank rows = not tested)  UPPER EXTREMITY MMT:    Tested grossly as "push" and "pull": UE push 4-/5, UE pull 4/5  HAND FUNCTION: Grip strength: Right: 30 lbs; Left: 20 lbs  COORDINATION: Finger Nose Finger test: mildly ataxic but  fairly accurate  Box and Blocks:  Right 11blocks, Left 5blocks  MUSCLE TONE:  Rt- normal, Lt- decreased and worsened by inattention/disuse  COGNITION: Overall cognitive status: Impaired and Difficulty to assess due to: severity of deficits; Poor attention, fluency, orientation, and executive function. Recall is fairly good as well as abstraction.  Seems to have personality changes due to vascular dementia- like an OCD type volitional perseveration of tapping with Rt hand that he can somewhat control.  He did become angry and agitated at the end of the session performing car transfer with spouse: he did shout at her.  VISION: Subjective report: no new changes per pt/spouse, uses "cheaters"   VISION ASSESSMENT: Visual fields seem intact though he had latent responses on the left side showing likely inattention/and/neglect, tracking somewhat delayed horizontally and vertically  PERCEPTION: Impaired: Inattention/neglect: does not attend to left visual field and does not attend to left side of body, Body scheme: poor quality of recognition with shaving, etc., and Spatial orientation: decreased in print and in orientation, especially to Lt side  PRAXIS: Not tested specifically but no overt/stated issues using pen, wearing glasses ,etc.   OBSERVATIONS: Pt taps and flicks and does seem to volitionally perseverate on making a rhythm/noise with Rt hand. He states he is aware of it and chooses to do it.  He does also seem to have mild resting and intention tremor.    TODAY'S TREATMENT:     DATE:10/27/22- Pipe tree design- Therapist asked pt to organize pieces by size, mod v.c for performance and encouragement to use LUE. Pt completed pipe tree design with therapist cuing pt for proper piece length and providing mod v.c and min facilitation for orientation of pieces. Pt demonstrates improved attention to task for at least 8 mins at a time, however he used LUE only minimally today despite cueing due to  neglect. Constant therapy activities to follow verbal instructions and to place items in the correct category with min-mod v.c   10/25/22-Seated on mat, closed chain shoulder flexion reaching for the floor, min-mod v.c Standing at countertop rocking forwards and backwards for gentle stretch, while weightbearing through bilateral UE's, min v.c for upright posture and scapular retraction when rocking forwards. Pt's wife brought in a butterfly house kit. Therapist assisted pt with fabricating butterfly house. Therapist read the instructions out loud  and  had pt locate various pieces, with min-mod v.c Pt required verbal cues and assistance for correct placement of pieces Pt was able to assist with placement of nails and hammering nails using RUE with min difficulty, mod v.c for amplitude with therapist stabilizing the pieces pt was nailing together. Pt kept LUE in a safe position away from hammer during task. Pt demonstrated good attention to task and he sustained attention for greater than 10 mins at a time prior to direction during task.  10/20/22- Doffing jacket with min a, min v.c Seated pt attempted to donn socks several different ways however he encountered challenges despite cueing. Therapist demonstrated how to use sock aid and pt was able to donn first sock with mod/ max a, second sock with mod A and mod v.c with increased time required. Pt removed socks with min A and mod v.c and donned slip on shoes following setup. Pt interactions today were different today likely related to medication change. He was a little more distracted and impulsive. Activities for following verbal directions, verbal comprehension and speech volume on Ipad, min-mod v.c    PATIENT EDUCATION: Education details: See treatment section above for details,  Person educated: Patient and Spouse Education method: Explanation, Demonstration, Tactile cues, and Verbal cues Education comprehension: verbalized understanding, returned  demonstration, verbal cues required, tactile cues required, and needs further education  HOME EXERCISE PROGRAM: Access Code: [email or web address] URL: [email or web address] Date: 07/21/2022 Prepared by: Fannie Knee Exercises - Seated Shoulder Flexion AAROM with Dowel  - 4-6 x daily - 1 sets - 10-15 reps - Seated Shoulder Abduction AAROM with Dowel  - 4-6 x daily - 1 sets - 10-15 reps - Seated Shoulder External Rotation AAROM with Dowel  - 4-6 x daily - 1 sets - 10-15 reps - Seated Bicep Curls with Bar  - 4-6 x daily - 1 sets - 10-15 reps - Seated Shoulder Row with Anchored Resistance  - 4-6 x daily - 1 sets - 10-15 reps     GOALS: Goals reviewed with patient? Yes   SHORT TERM GOALS: (STG required if POC>30 days) Target Date: 09/23/22   Pt will demo/state understanding of initial HEP (or home activity suggestions to maintain function as appropriate) to improve functional skills. Goal status: 07/28/22- Met (addressed in past 2 sessions) 2. Pt will donn a t-shirt consistently with min A and v.c   Goal status: met , per wife's report 3. Pt will demonstrate ability to open the newspaper  with supervision min v.c Goal status:  ongoing, inconsistent, min , mod v.c at times 10/05/22 4.  Pt will consistently sustain focus to a simple functional task  in a minimally distracting environment x 6 mins with no more than 1 re-direct(ie: eating, functional task in clinic)  Goal status: partially met, pt demonstrates ability to perform in clinic when engaged in a task that is meaningful to him 10/26/22  5. Pt will consistently open containers with only supervision and min v.c           Goal status: partially met,  opens most containers at this level  in the clinic, however sometimes has difficulty with coffee containers at home sometimes, per wife's report. 10/05/22    LONG TERM GOALS: Target Date: 11/16/22  Pt will improve functional ability by decreased impairment per Trinna Balloon  assessment from 1/6 to 2/6 or better, for better quality of life. Goal status:  not met 1/6  2.  Caregiver will state his grooming ability/hygiene ability improved from moderate  assist to supervision assist. Goal status: not met-Pt continues to require mod A.  3.  He will show better functional use of left arm by improved box and blocks Test score from 5 blocks to at least 10 blocks. Goal status:not met but improved 8 blocks  4.  Pt will show ability to microwave instant coffee or small meal safely, to help decrease caregiver burden. Goal status: partially met, pt is able to microwave coffee with supervision, however occasional min A to open containers 5.  He will show improved cognitive ability enough to play a simple game with OT and caregiver. Goal status: approximating goal, pt has played a game of uno at home with family.  6.  Caregiver will state at least 4 ways to decrease perseveration with right arm, also stating that this behaviors at least somewhat better in the home now. Goal status: deferred perseverative behaviors have improved.  7. Pt will donn a flannel button front shirt consistently with min A and min v.c Baseline: mod-max Goal status:  ongoing, inconsitent min-mod v.c 09/15/22, pt doffed today with supervision and v.c 10/12/22 8.  Pt will use his LUE as an active assist for ADLS grossly 40% of the time with min v.c Baseline: uses 25% Goal status:  ongoing inconsistent use of RUE 10/12/22  9 Pt will stand at the countertop and perform functional activities  incorporating bilateral UE's with supervision and min v.c  Goal status:  ongoing, min assist at time-09/15/22 10. Pt will back up to a chair and sit down squarely at least 1/3 of the time with min v.c  Goal status: ongoing , performs in clinic inconsistently min-mod v.c 10/27/22 11..  Pt will open the newspaper and locate sports section with no more than min v.c                          Goal status:  ongoing, min-mod A at  times 09/15/22  ASSESSMENT:  CLINICAL IMPRESSION: Pt is progressing towards goals.Pt demonstrated good sustained attention today while working on I pad for constant therapy and for pipe tree design. Pt continues to use LUE only a limited amount at times due to significant neglect.  OT Frequency/DURATION: 2x week x 12 weeks however anticipate d/c after 8 weeks dependent on progress  PLANNED INTERVENTIONS: self care/ADL training, therapeutic exercise, therapeutic activity, neuromuscular re-education, manual therapy, balance training, functional mobility training, moist heat, cryotherapy, patient/family education, cognitive remediation/compensation, visual/perceptual remediation/compensation, psychosocial skills training, energy conservation, coping strategies training, DME and/or AE instructions, and Re-evaluation  RECOMMENDED OTHER SERVICES: He has already seen PT for balance and functional mobility, he would probably benefit from speech for cognitive issues as well, though OT will be working with cognition somewhat functionally now.  CONSULTED AND AGREED WITH PLAN OF CARE: Patient and family member/caregiver  PLAN FOR NEXT SESSION:    Work towards goals, dynamic step and reach, ADLS, LUE functional use , functional activities using bilateral UE's  Keene Breath, OTR/L 8:23 AM 10/28/22

## 2022-10-29 DIAGNOSIS — S32019D Unspecified fracture of first lumbar vertebra, subsequent encounter for fracture with routine healing: Secondary | ICD-10-CM | POA: Diagnosis not present

## 2022-10-29 DIAGNOSIS — M5430 Sciatica, unspecified side: Secondary | ICD-10-CM | POA: Diagnosis not present

## 2022-10-29 DIAGNOSIS — R0902 Hypoxemia: Secondary | ICD-10-CM | POA: Diagnosis not present

## 2022-10-29 DIAGNOSIS — G8929 Other chronic pain: Secondary | ICD-10-CM | POA: Diagnosis not present

## 2022-10-29 DIAGNOSIS — I69359 Hemiplegia and hemiparesis following cerebral infarction affecting unspecified side: Secondary | ICD-10-CM | POA: Diagnosis not present

## 2022-11-01 ENCOUNTER — Ambulatory Visit: Payer: PPO | Admitting: Occupational Therapy

## 2022-11-01 DIAGNOSIS — M6281 Muscle weakness (generalized): Secondary | ICD-10-CM

## 2022-11-01 DIAGNOSIS — I69318 Other symptoms and signs involving cognitive functions following cerebral infarction: Secondary | ICD-10-CM

## 2022-11-01 DIAGNOSIS — R41840 Attention and concentration deficit: Secondary | ICD-10-CM

## 2022-11-01 DIAGNOSIS — R2689 Other abnormalities of gait and mobility: Secondary | ICD-10-CM

## 2022-11-01 DIAGNOSIS — I69354 Hemiplegia and hemiparesis following cerebral infarction affecting left non-dominant side: Secondary | ICD-10-CM

## 2022-11-01 DIAGNOSIS — R278 Other lack of coordination: Secondary | ICD-10-CM

## 2022-11-01 NOTE — Therapy (Signed)
OUTPATIENT OCCUPATIONAL THERAPY TREATMENT NOTE  Patient Name: William [name] MRN: [id number] DOB:[birth year], 82 y.o., male Today's Date: 11/01/2022  PCP & REFERRING PROVIDER:  [name], [name], DO    END OF SESSION:  OT End of Session - 11/01/22 1653     Visit Number 28    Number of Visits 32    Date for OT Re-Evaluation 11/16/22    Authorization Type [employer]    Authorization - Visit Number 28    Progress Note Due on Visit 30    OT Start Time 1530    OT Stop Time 1615    OT Time Calculation (min) 45 min    Activity Tolerance Patient tolerated treatment well    Behavior During Therapy WFL for tasks assessed/performed                             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   HTN (hypertension) 07/19/2015   Hyperlipidemia    Major neurocognitive disorder 01/09/2014   Mild, related to stroke history   Nocturia    Renal insufficiency 06/25/2013   S/P carotid endarterectomy    BILATERAL ICA--  PATENT PER DUPLEX  05-19-2012   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   Right calf   Urinary frequency    Vitamin D deficiency    Past Surgical History:  Procedure Laterality Date   APPENDECTOMY  AS CHILD   CARDIOVASCULAR STRESS TEST  03-27-2012  DR CRENSHAW   LOW RISK LEXISCAN STUDY-- PROBABLE NORMAL PERFUSION AND SOFT TISSUE ATTENUATION/  NO ISCHEMIA/ EF 51%   CAROTID ENDARTERECTOMY Bilateral LEFT  11-12-2008  DR GREG HAYES   RIGHT ICA  2006  ([facility])   CHOLECYSTECTOMY N/A 02/23/2022   Procedure:  LAPAROSCOPIC CHOLECYSTECTOMY;  Surgeon: Quentin Ore, MD;  Location: [facility] OR;  Service: General;  Laterality: N/A;   CYSTOSCOPY W/ RETROGRADES Bilateral 06/22/2021   Procedure: CYSTOSCOPY WITH RETROGRADE PYELOGRAM;  Surgeon: Marcine Matar, MD;  Location: [facility];  Service: Urology;  Laterality: Bilateral;   CYSTOSCOPY WITH LITHOLAPAXY N/A 02/26/2013   Procedure: CYSTOSCOPY WITH LITHOLAPAXY;  Surgeon: Marcine Matar, MD;  Location: [facility];  Service: Urology;  Laterality: N/A;   ENDOSCOPIC RETROGRADE CHOLANGIOPANCREATOGRAPHY (ERCP) WITH PROPOFOL N/A 02/22/2022   Procedure: ENDOSCOPIC RETROGRADE CHOLANGIOPANCREATOGRAPHY (ERCP) WITH PROPOFOL;  Surgeon: Jeani Hawking, MD;  Location: [facility] ENDOSCOPY;  Service: Gastroenterology;  Laterality: N/A;   EYE SURGERY  Jan. 2016   cataract surgery both eyes   INGUINAL HERNIA REPAIR Right 11-08-2006   IR KYPHO EA ADDL LEVEL THORACIC OR LUMBAR  02/12/2021   IR RADIOLOGIST EVAL & MGMT  02/18/2021   MASS EXCISION N/A 03/03/2016   Procedure: EXCISION OF BACK  MASS;  Surgeon: Almond Lint, MD;  Location: [facility];  Service: General;  Laterality: N/A;   MOHS SURGERY Left 1/ 2016   Dr Margo Aye-- Basal cell   PROSTATE SURGERY  REMOVAL OF STONES  02/22/2022   Procedure: REMOVAL OF STONES;  Surgeon: Jeani Hawking, MD;  Location: [facility] ENDOSCOPY;  Service: Gastroenterology;;   Dennison Mascot  02/22/2022   Procedure: Dennison Mascot;  Surgeon: Jeani Hawking, MD;  Location: [facility] ENDOSCOPY;  Service: Gastroenterology;;   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-C N/A 06/22/2021   Procedure: TRANSURETHRAL RESECTION OF BLADDER TUMOR;  Surgeon: Marcine Matar, MD;  Location: [facility];  Service: Urology;  Laterality: N/A;   TRANSURETHRAL RESECTION OF PROSTATE N/A 02/26/2013   Procedure: TRANSURETHRAL RESECTION OF THE PROSTATE WITH GYRUS INSTRUMENTS;  Surgeon: Marcine Matar, MD;  Location:  [facility];  Service: Urology;  Laterality: N/A;   TRANSURETHRAL RESECTION OF PROSTATE N/A 06/22/2021   Procedure: TRANSURETHRAL RESECTION OF THE PROSTATE (TURP);  Surgeon: Marcine Matar, MD;  Location: [facility];  Service: Urology;  Laterality: N/A;   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   Insomnia    Prediabetes    Acute 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 (HCC)    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   Transaminitis 07/27/2021   UTI (urinary tract infection) 07/27/2021   CVA (cerebral vascular accident) (HCC) 07/27/2021   Fall 07/27/2021   Hyperglycemia 07/27/2021   Cholelithiasis 07/27/2021   Hypoxia 07/27/2021   Nausea and vomiting 07/27/2021   Acute metabolic encephalopathy 07/27/2021   Normocytic anemia 07/27/2021   Chronic back pain 07/27/2021   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   Closed fracture of first lumbar vertebra with routine healing 02/03/2021   Closed fracture of multiple ribs 11/18/2020   Anxiety 01/29/2020   Leg pain, bilateral 01/29/2020   Ingrown toenail 07/13/2019   Lumbar spondylosis 05/02/2018   Pain in left knee 03/09/2018   Osteoarthritis of left hip 01/16/2018   Trochanteric bursitis of left hip 01/16/2018   Preventative health care 09/26/2017   HTN (hypertension) 07/19/2015    Hyperlipidemia 07/19/2015   Great toe pain 02/11/2014   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   Obesity (BMI 30-39.9) 06/25/2013   Renal insufficiency 06/25/2013   Weakness of left arm 06/25/2013   Sebaceous cyst 03/03/2011   Sprain of lumbar region 07/31/2010   Rib pain, left 08/29/2009   Carotid artery stenosis, asymptomatic, bilateral 05/02/2009   Eczema, atopic 05/31/2008   Vitamin D deficiency 03/01/2008   BPH (benign prostatic hyperplasia) 08/06/2007   Fasting hyperglycemia 12/21/2006   History of right MCA infarct 2006    ONSET DATE: Acute exacerbation of chronic symptoms (most recent hospitalization 02/26/22 - 03/12/22, followed by SNF and home health)   REFERRING DIAG:  I63.9 (ICD-10-CM) - Cerebrovascular accident (CVA), unspecified mechanism (HCC)  W19.XXXD (ICD-10-CM) - Fall, subsequent encounter    THERAPY DIAG:  Muscle weakness (generalized)  Hemiplegia and hemiparesis following cerebral infarction affecting left non-dominant side (HCC)  Other lack of coordination  Attention and concentration deficit  Other symptoms and  signs involving cognitive functions following cerebral infarction  Other abnormalities of gait and mobility  Rationale for Evaluation and Treatment: Rehabilitation  PERTINENT HISTORY: PMHx includes L basal ganglia and corona radiata infarct 07/27/21, L lower homonymous quadrantanopia; R MCA infarct in 2006 w/ L-sided hemiparesis, NCD, carotid artery disease (bilateral), HTN, HLD,CKD3, vascular dementia, and hx of bladder cancer. He has had falls in 2023, fracturing his clavicle in August, then he had stomach surgery in Sept 2023. He has He needs assist at baseline for ADLs. When last documented in acute rehab after having his gall-bladder removed, (03/09/22) he required moderate to maximum assist x2 for most ADLs (more assist for ADLs the require standing), and they recommended rehab in skilled nursing setting. He apparently got Covid at  some point and also attended a SNF. Once he D/C home, he had some episodes of combativeness at night  with "sun-downing."  He is communicative and appropriate, though appears easily confused and distracted, not fully oriented to time and place (Ax2, states date is 46 and cannot say what city he lives in). His wife provides most details, and he is participative, though a poor historian at times. In a nut shell- he has been suffering from dementia and post-stroke effects for years, and his issues were recently exacerbated in Sept of last year after abdominal surgery. He has less use of Lt "stroke side" and new, ritualistic "ticks" in mouth and Rt hand/arm.  Wife states that they do have a caregiver come M, W, F for 4 hours in the middle of the day to mainly help him bathe and dress, which usually tires him out. He has been more combative towards the end of the day as well.   PRECAUTIONS: Fall and Other: Lt hemi, poor cognition, LATEX and adhesive allergies, etc.  ;WEIGHT BEARING RESTRICTIONS: No   SUBJECTIVE:   SUBJECTIVE STATEMENT: Doing ok Pt accompanied by:  his wife   PAIN: no     FALLS: Has patient fallen in last 6 months? Yes. Number of falls at least 1 resulting in clavicle fx (now healed)   LIVING ENVIRONMENT: Lives with: lives with their spouse Lives in: House/apartment Has following equipment at home: manual w/c, 2 w/w, weighted utensils, shower bench (for tub), bed cane, hospital bed, raised toilet, and possibly more  PLOF: Needs assistance with ADLs, Needs assistance with homemaking, Needs assistance with gait, and Needs assistance with transfers  PATIENT GOALS: at eval- increase personal hygiene skills (brush teeth, shaving)  prepare instant coffee or microwave meal improve functional cognitive skills to play games, etc.,   increase Lt arm usage try to help decrease Rt hand/arm perseveration (flicking, tapping, noise making) Improve fnl tranfers in/out of multiple seats  (tub bench, swivel chair, etc.)    OBJECTIVE: (All objective assessments below are from initial evaluation on: 07/07/22 unless otherwise specified.)   HAND DOMINANCE: Right  ADLs: Overall ADLs: Wife states decreased ability since hospitalization in Sept 23.  Transfers/ambulation related to ADLs: Min A transfer, CGA for mobility Eating: set up Grooming: Mod A- needs assist with oral care, shaving, but can comb his hair UB Dressing: Min A  LB Dressing: Dep  Toileting:  Mod  Bathing: Mod  Tub Shower transfers: Min A  Equipment: Emergency planning/management officer, Grab bars, and Feeding equipment  IADLs: completely dependent for years now, except some ability to use microwave which is now not possible after hospitalization   MOBILITY STATUS: Needs Assist: CGA - Min A, Hx of falls, Festination, difficulty with  turns, and neglect on Lt side  POSTURE COMMENTS:  rounded shoulders, forward head, and weight shift left  ACTIVITY TOLERANCE: Activity tolerance: he became tired and somewhat agitated by the end of the OT evaluation today (after PT and OT about 2.5hours with mostly sedate activities in seated positions)  FUNCTIONAL OUTCOME MEASURES: [facility] 17/30  showing problems with visuospatial/executive function, attention, fluency, and orientation. (at least 26/30 is needed to be considered "normal")  [employer] in ADLs: 1/6  (very dependent)  UPPER EXTREMITY ROM:    Active ROM Right eval Left eval  Shoulder scaption 100 85  Shoulder abduction    Shoulder adduction    Shoulder extension    Shoulder internal rotation Reaches small of back Reaches to hip  Shoulder external rotation Reaches back of head  Reaches up to ear  Elbow flexion 150 130  Elbow extension (-10*)  (-35*)  Wrist flexion 35 20  Wrist extension 50 10  (Blank rows = not tested)  UPPER EXTREMITY MMT:    Tested grossly as "push" and "pull": UE push 4-/5, UE pull 4/5  HAND FUNCTION: Grip strength: Right: 30 lbs; Left: 20  lbs  COORDINATION: Finger Nose Finger test: mildly ataxic but fairly accurate  Box and Blocks:  Right 11blocks, Left 5blocks  MUSCLE TONE:  Rt- normal, Lt- decreased and worsened by inattention/disuse  COGNITION: Overall cognitive status: Impaired and Difficulty to assess due to: severity of deficits; Poor attention, fluency, orientation, and executive function. Recall is fairly good as well as abstraction.  Seems to have personality changes due to vascular dementia- like an OCD type volitional perseveration of tapping with Rt hand that he can somewhat control.  He did become angry and agitated at the end of the session performing car transfer with spouse: he did shout at her.  VISION: Subjective report: no new changes per pt/spouse, uses "cheaters"   VISION ASSESSMENT: Visual fields seem intact though he had latent responses on the left side showing likely inattention/and/neglect, tracking somewhat delayed horizontally and vertically  PERCEPTION: Impaired: Inattention/neglect: does not attend to left visual field and does not attend to left side of body, Body scheme: poor quality of recognition with shaving, etc., and Spatial orientation: decreased in print and in orientation, especially to Lt side  PRAXIS: Not tested specifically but no overt/stated issues using pen, wearing glasses ,etc.   OBSERVATIONS: Pt taps and flicks and does seem to volitionally perseverate on making a rhythm/noise with Rt hand. He states he is aware of it and chooses to do it.  He does also seem to have mild resting and intention tremor.    TODAY'S TREATMENT:     DATE:11/01/22- Seated closed chain shoulder flexion,reaching to floor. Boomwhackers for bilateral UE following sequence and verbal instructions, min v.c Cueing for backing up to mat and walking through doorways. Standing functional reaching with left and right UE's, mod v.c for LUE functional use Constant therapy activities to address the following:  following directions, speech volume, and  verbal comprehension, mod -max v.c and facilitation for tasks. Pt demonstrates impulsivity during activities.    10/27/22- Pipe tree design- Therapist asked pt to organize pieces by size, mod v.c for performance and encouragement to use LUE. Pt completed pipe tree design with therapist cuing pt for proper piece length and providing mod v.c and min facilitation for orientation of pieces. Pt demonstrates improved attention to task for at least 8 mins at a time, however he used LUE only minimally today despite cueing due to  neglect. Constant therapy activities to follow verbal instructions and to place items in the correct category with min-mod v.c   10/25/22-Seated on mat, closed chain shoulder flexion reaching for the floor, min-mod v.c Standing at countertop rocking forwards and backwards for gentle stretch, while weightbearing through bilateral UE's, min v.c for upright posture and scapular retraction when rocking forwards. Pt's wife brought in a butterfly house kit. Therapist assisted pt with fabricating butterfly house. Therapist read the instructions out loud  and had pt locate various pieces, with min-mod v.c Pt required verbal cues and assistance for correct placement of pieces Pt was able to assist with placement of nails and hammering nails using RUE with min difficulty, mod v.c for amplitude with therapist stabilizing the pieces pt was nailing together. Pt kept LUE in a safe position away from hammer during task. Pt demonstrated good attention to task and he sustained attention for greater than 10 mins at a time prior to direction during task.  10/20/22- Doffing jacket with min a, min v.c Seated pt attempted to donn socks several different ways however he encountered challenges despite cueing. Therapist demonstrated how to use sock aid and pt was able to donn first sock with mod/ max a, second sock with mod A and mod v.c with increased time required. Pt  removed socks with min A and mod v.c and donned slip on shoes following setup. Pt interactions today were different today likely related to medication change. He was a little more distracted and impulsive. Activities for following verbal directions, verbal comprehension and speech volume on Ipad, min-mod v.c    PATIENT EDUCATION: Education details: See treatment section above for details,  Person educated: Patient and Spouse Education method: Explanation, Demonstration, Tactile cues, and Verbal cues Education comprehension: verbalized understanding, returned demonstration, verbal cues required, tactile cues required, and needs further education  HOME EXERCISE PROGRAM: Access Code: [email or web address] URL: [email or web address] Date: 07/21/2022 Prepared by: Fannie Knee Exercises - Seated Shoulder Flexion AAROM with Dowel  - 4-6 x daily - 1 sets - 10-15 reps - Seated Shoulder Abduction AAROM with Dowel  - 4-6 x daily - 1 sets - 10-15 reps - Seated Shoulder External Rotation AAROM with Dowel  - 4-6 x daily - 1 sets - 10-15 reps - Seated Bicep Curls with Bar  - 4-6 x daily - 1 sets - 10-15 reps - Seated Shoulder Row with Anchored Resistance  - 4-6 x daily - 1 sets - 10-15 reps     GOALS: Goals reviewed with patient? Yes   SHORT TERM GOALS: (STG required if POC>30 days) Target Date: 09/23/22   Pt will demo/state understanding of initial HEP (or home activity suggestions to maintain function as appropriate) to improve functional skills. Goal status: 07/28/22- Met (addressed in past 2 sessions) 2. Pt will donn a t-shirt consistently with min A and v.c   Goal status: met , per wife's report 3. Pt will demonstrate ability to open the newspaper  with supervision min v.c Goal status:  ongoing, inconsistent, min , mod v.c at times 10/05/22 4.  Pt will consistently sustain focus to a simple functional task  in a minimally distracting environment x 6 mins with no more than 1 re-direct(ie:  eating, functional task in clinic)  Goal status: partially met, pt demonstrates ability to perform in clinic when engaged in a task that is meaningful to him 10/26/22  5. Pt will consistently open containers with only supervision and min v.c  Goal status: partially met,  opens most containers at this level  in the clinic, however sometimes has difficulty with coffee containers at home sometimes, per wife's report. 10/05/22    LONG TERM GOALS: Target Date: 11/16/22  Pt will improve functional ability by decreased impairment per Trinna Balloon assessment from 1/6 to 2/6 or better, for better quality of life. Goal status:  not met 1/6  2.  Caregiver will state his grooming ability/hygiene ability improved from moderate assist to supervision assist. Goal status: not met-Pt continues to require mod A.  3.  He will show better functional use of left arm by improved box and blocks Test score from 5 blocks to at least 10 blocks. Goal status:not met but improved 8 blocks  4.  Pt will show ability to microwave instant coffee or small meal safely, to help decrease caregiver burden. Goal status: partially met, pt is able to microwave coffee with supervision, however occasional min A to open containers 5.  He will show improved cognitive ability enough to play a simple game with OT and caregiver. Goal status: approximating goal, pt has played a game of uno at home with family.  6.  Caregiver will state at least 4 ways to decrease perseveration with right arm, also stating that this behaviors at least somewhat better in the home now. Goal status: deferred perseverative behaviors have improved.  7. Pt will donn a flannel button front shirt consistently with min A and min v.c Baseline: mod-max Goal status:  ongoing, inconsitent min-mod v.c 09/15/22, pt doffed today with supervision and v.c 10/12/22 8.  Pt will use his LUE as an active assist for ADLS grossly 40% of the time with min  v.c Baseline: uses 25% Goal status:  ongoing inconsistent use of RUE 10/12/22  9 Pt will stand at the countertop and perform functional activities  incorporating bilateral UE's with supervision and min v.c  Goal status:  ongoing, min assist at time-09/15/22 10. Pt will back up to a chair and sit down squarely at least 1/3 of the time with min v.c  Goal status: ongoing , performs in clinic inconsistently min-mod v.c 10/27/22 11..  Pt will open the newspaper and locate sports section with no more than min v.c                          Goal status:  ongoing, min-mod A at times 09/15/22  ASSESSMENT:  CLINICAL IMPRESSION: Pt is progressing towards goals.Pt continues to benefit from tasks that address LUE functional use and attention/ cognition. OT Frequency/DURATION: 2x week x 12 weeks however anticipate d/c after 8 weeks dependent on progress  PLANNED INTERVENTIONS: self care/ADL training, therapeutic exercise, therapeutic activity, neuromuscular re-education, manual therapy, balance training, functional mobility training, moist heat, cryotherapy, patient/family education, cognitive remediation/compensation, visual/perceptual remediation/compensation, psychosocial skills training, energy conservation, coping strategies training, DME and/or AE instructions, and Re-evaluation  RECOMMENDED OTHER SERVICES: He has already seen PT for balance and functional mobility, he would probably benefit from speech for cognitive issues as well, though OT will be working with cognition somewhat functionally now.  CONSULTED AND AGREED WITH PLAN OF CARE: Patient and family member/caregiver  PLAN FOR NEXT SESSION:  Check short term goals, dynamic step and reach, ADLS, LUE functional use , functional activities using bilateral UE's  Keene Breath, OTR/L 4:54 PM 11/01/22

## 2022-11-02 ENCOUNTER — Encounter: Payer: Self-pay | Admitting: Physical Therapy

## 2022-11-02 ENCOUNTER — Ambulatory Visit: Payer: PPO | Admitting: Physical Therapy

## 2022-11-02 DIAGNOSIS — L821 Other seborrheic keratosis: Secondary | ICD-10-CM | POA: Diagnosis not present

## 2022-11-02 DIAGNOSIS — R278 Other lack of coordination: Secondary | ICD-10-CM

## 2022-11-02 DIAGNOSIS — R262 Difficulty in walking, not elsewhere classified: Secondary | ICD-10-CM

## 2022-11-02 DIAGNOSIS — D225 Melanocytic nevi of trunk: Secondary | ICD-10-CM | POA: Diagnosis not present

## 2022-11-02 DIAGNOSIS — R296 Repeated falls: Secondary | ICD-10-CM

## 2022-11-02 DIAGNOSIS — M6281 Muscle weakness (generalized): Secondary | ICD-10-CM

## 2022-11-02 DIAGNOSIS — L708 Other acne: Secondary | ICD-10-CM | POA: Diagnosis not present

## 2022-11-02 DIAGNOSIS — I69354 Hemiplegia and hemiparesis following cerebral infarction affecting left non-dominant side: Secondary | ICD-10-CM

## 2022-11-02 NOTE — Therapy (Signed)
OUTPATIENT PHYSICAL THERAPY NEURO Treatment   Patient Name: William [name] MRN: [id number] DOB:10/25/1940, 82 y.o., male Today's Date: 11/02/2022  PCP: Donato Schultz  DO REFERRING PROVIDER: same  END OF SESSION:  PT End of Session - 11/02/22 1313     Visit Number [id number]    Date for PT Re-Evaluation 12/03/22    Authorization Type HTA    PT Start Time 1311    PT Stop Time 1355    PT Time Calculation (min) 44 min    Equipment Utilized During Treatment Gait belt    Activity Tolerance Patient tolerated treatment well    Behavior During Therapy WFL for tasks assessed/performed                 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   HTN (hypertension) 07/19/2015   Hyperlipidemia    Major neurocognitive disorder 01/09/2014   Mild, related to stroke history   Nocturia    Renal insufficiency 06/25/2013   S/P carotid endarterectomy    BILATERAL ICA--  PATENT PER DUPLEX  05-19-2012   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/26/2017   Right calf   Urinary frequency    Vitamin D deficiency    Past Surgical History:  Procedure Laterality Date   APPENDECTOMY  AS CHILD   CARDIOVASCULAR STRESS TEST  03-27-2012  DR CRENSHAW   LOW RISK LEXISCAN STUDY-- PROBABLE NORMAL PERFUSION AND SOFT TISSUE ATTENUATION/  NO ISCHEMIA/ EF 51%   CAROTID ENDARTERECTOMY Bilateral LEFT  11-12-2008  DR GREG HAYES   RIGHT ICA  2006  ([facility])   CHOLECYSTECTOMY N/A 02/23/2022   Procedure: LAPAROSCOPIC CHOLECYSTECTOMY;  Surgeon: Quentin Ore, MD;  Location: [facility] OR;  Service:  General;  Laterality: N/A;   CYSTOSCOPY W/ RETROGRADES Bilateral 06/22/2021   Procedure: CYSTOSCOPY WITH RETROGRADE PYELOGRAM;  Surgeon: Marcine Matar, MD;  Location: [facility];  Service: Urology;  Laterality: Bilateral;   CYSTOSCOPY WITH LITHOLAPAXY N/A 02/26/2013   Procedure: CYSTOSCOPY WITH LITHOLAPAXY;  Surgeon: Marcine Matar, MD;  Location: [facility];  Service: Urology;  Laterality: N/A;   ENDOSCOPIC RETROGRADE CHOLANGIOPANCREATOGRAPHY (ERCP) WITH PROPOFOL N/A 02/22/2022   Procedure: ENDOSCOPIC RETROGRADE CHOLANGIOPANCREATOGRAPHY (ERCP) WITH PROPOFOL;  Surgeon: Jeani Hawking, MD;  Location: [facility] ENDOSCOPY;  Service: Gastroenterology;  Laterality: N/A;   EYE SURGERY  Jan. 2016   cataract surgery both eyes   INGUINAL HERNIA REPAIR Right 11-08-2006   IR KYPHO EA ADDL LEVEL THORACIC OR LUMBAR  02/12/2021   IR RADIOLOGIST EVAL & MGMT  02/18/2021   MASS EXCISION N/A 03/03/2016   Procedure: EXCISION OF BACK  MASS;  Surgeon: Almond Lint, MD;  Location: [facility];  Service: General;  Laterality: N/A;   MOHS SURGERY Left 1/ 2016   Dr Margo Aye-- Basal cell   PROSTATE SURGERY     REMOVAL OF STONES  02/22/2022   Procedure: REMOVAL OF STONES;  Surgeon: Jeani Hawking, MD;  Location: [facility] ENDOSCOPY;  Service: Gastroenterology;;   Dennison Mascot  02/22/2022  Procedure: SPHINCTEROTOMY;  Surgeon: Jeani Hawking, MD;  Location: [facility] ENDOSCOPY;  Service: Gastroenterology;;   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-C N/A 06/22/2021   Procedure: TRANSURETHRAL RESECTION OF BLADDER TUMOR;  Surgeon: Marcine Matar, MD;  Location: [facility];  Service: Urology;  Laterality: N/A;   TRANSURETHRAL RESECTION OF PROSTATE N/A 02/26/2013   Procedure: TRANSURETHRAL RESECTION OF THE PROSTATE WITH GYRUS INSTRUMENTS;  Surgeon: Marcine Matar, MD;  Location: [facility];  Service: Urology;  Laterality: N/A;   TRANSURETHRAL RESECTION OF  PROSTATE N/A 06/22/2021   Procedure: TRANSURETHRAL RESECTION OF THE PROSTATE (TURP);  Surgeon: Marcine Matar, MD;  Location: [facility];  Service: Urology;  Laterality: N/A;   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   Insomnia    Prediabetes    Acute 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 (HCC)    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   Transaminitis 07/27/2021   UTI (urinary tract infection) 07/27/2021   CVA (cerebral vascular accident) (HCC) 07/27/2021   Fall 07/27/2021   Hyperglycemia 07/27/2021   Cholelithiasis 07/27/2021   Hypoxia 07/27/2021   Nausea and vomiting 07/27/2021   Acute metabolic encephalopathy 07/27/2021   Normocytic anemia 07/27/2021   Chronic back pain 07/27/2021   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   Closed fracture of first lumbar vertebra with routine healing 02/03/2021   Closed fracture of multiple ribs 11/18/2020   Anxiety 01/29/2020   Leg pain, bilateral 01/29/2020   Ingrown toenail 07/13/2019   Lumbar spondylosis 05/02/2018   Pain in left knee 03/09/2018   Osteoarthritis of left hip 01/16/2018   Trochanteric bursitis of left hip 01/16/2018   Preventative health care 09/26/2017   HTN (hypertension) 07/19/2015   Hyperlipidemia 07/19/2015   Great toe pain 02/11/2014   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   Obesity (BMI 30-39.9) 06/25/2013   Renal insufficiency 06/25/2013   Weakness of left arm 06/25/2013   Sebaceous cyst 03/03/2011   Sprain of lumbar region 07/31/2010   Rib pain, left 08/29/2009   Carotid artery stenosis, asymptomatic, bilateral 05/02/2009   Eczema, atopic 05/31/2008   Vitamin D deficiency 03/01/2008   BPH (benign prostatic hyperplasia) 08/06/2007   Fasting hyperglycemia 12/21/2006   History of right MCA infarct 2006    ONSET DATE: 06/29/22  REFERRING DIAG:  Diagnosis  I63.9 (ICD-10-CM) - Cerebrovascular accident (CVA), unspecified mechanism (HCC)  W19.XXXD (ICD-10-CM) - Fall, subsequent encounter    THERAPY DIAG:  Muscle weakness (generalized)  Hemiplegia and hemiparesis following cerebral infarction affecting left non-dominant side (HCC)  Other lack of coordination  Difficulty in walking, not elsewhere classified  Repeated falls  Rationale for Evaluation and Treatment: Rehabilitation  SUBJECTIVE:  SUBJECTIVE STATEMENT:  No falls recently, some ups and downs as far as days with his abilities PERTINENT HISTORY:  BRONN ALTIDOR is a 82 year old man with dementia, CKD, HTN, CAD, HLD and history of CVA  (2006, 2021), Parkinson's  PAIN:  Are you having pain? No  PRECAUTIONS: Fall  WEIGHT BEARING RESTRICTIONS: No  FALLS: Has patient fallen in last 6 months? No  LIVING ENVIRONMENT: Lives with: lives with their family and lives with their spouse Lives in: House/apartment Stairs:  1 brick high step Has following equipment at home: Environmental consultant - 2 wheeled, Wheelchair (manual), Shower bench, bed side commode, Grab bars, and hospital bed, sliding pad for car  PLOF: Independent with basic ADLs and Independent with household mobility without device  PATIENT  GOALS: Walk around the block with his wife. Be able to use the dining room chair rather than have to use the W/C. Increased I with bed mobility and simple tasks at home like make some coffee or brush his teeth, Step over tub to shower, has bench, but he can't really use it. Up and down the one step to get to back deck.  OBJECTIVE:   DIAGNOSTIC FINDINGS:  MRI 2021 with degenerative changes in lumbar spine, L3-4 R subarticular stenosis  COGNITION: Overall cognitive status: History of cognitive impairments - at baseline   SENSATION: Not tested  COORDINATION: Moderately impaired BLE   MUSCLE LENGTH: Hamstrings: severely restricted B Thomas test: Severely restricted B.  POSTURE: rounded shoulders, forward head, increased thoracic kyphosis, posterior pelvic tilt, and flexed trunk   LOWER EXTREMITY ROM:   BLE extremely stiff throughout, limited hip ROM in all planes, B knees limited in extension.   LOWER EXTREMITY MMT:  3+/5 throughout. Unable to determine accuracy of MMT due to cognition. Functionally demosntrates poor coordinated activation and poor muscular endurance.   BED MOBILITY: Min A for Supine<> Sit. Patient was using a ladder in his hospital bed and could move MI.  TRANSFERS: Min A, mod VC, tends to lean back.  11/02/22 independent with use of hands on arm chair with a couple of tries  CURB: Min A-Has one step out to his deck.  STAIRS: N/A   GAIT: Gait pattern: step to pattern, decreased arm swing- Right, decreased arm swing- Left, decreased step length- Right, decreased step length- Left, shuffling, festinating, trunk flexed, narrow BOS, poor foot clearance- Right, and poor foot clearance- Left Distance walked: 61' Assistive device utilized: Environmental consultant - 2 wheeled Level of assistance: Occasional min A Comments: mod VC to stay close to walker, take longer steps.  FUNCTIONAL TESTS:  5 times sit to stand: 38.23  09/29/22  could not performs 5XSTS, 11/02/22 = 47 seconds with  cues to use arms Timed up and go (TUG): 47.91  with RW, 07/21/22 TUG 29  seconds without device, 09/29/22 25 seconds, 11/02/22 TUG 20 seconds  TODAY'S TREATMENT:  DATE:  11/02/22 Nustep level 5 x 6 minutes Gait outside with gait belt and HHA, cues verbal and tactile for speed and step length Ball tapping sitting for core and standing for balance Func reaching and carrying for balance Navigating on and over obstalces cg-min with cuing to get closer ot object before he steps- he tends to step from too far away from object STS working on leg strength holding ball 10x Step taps 6 inc 10 x each leg then alt increased LOB Gait outside HHA/CGA better steps today   10/28/22 Amb outside with gait belt and HHA with gentle pull to work on increased cadence and step thru- stressing with shuffled steps and leaning fwd Walking in clinic looking for cones  and working on scanning, turns and navigating obstacles  [place] tap standing and sitting balloon taps Obstacle course navigation over and around- min A with cuing for safety to get closer before stepping on or over HS curl 2 sets 10 20# Knee ext 2 sets 10 10# Alt taps 6 inch 20 x 2 sets CGA Ball toss working on 2 hand catch STS with wt ball press working on 2 hands slightly elevated mat  10/25/21 Nustep level 5 x 6 mintues Folding towels from low surface and then stacking towels up on a high surface at his side so he was having to turn, then we carried it and stacked it on a shelf 2.5# LAQ's 2.5# marches 2.5# hip abduction standing Green tband ankle PF/DF Green tband HS curls Standing with HHA cone toe touches Gait HHA with slight pull to get faster and bigger steps Volleyball in standing  10/21/22 Gait outside 1/2 parking [place] Folding towels from low surface to high surface with turn while standing, carry towels and  stack Volley ball sitting and standing Ball kicks On mat table bridges, rolls, green tband clamshells, partial sit ups Got him in quadraped raise one arm and then leg, difficulty getting gout of this position Step and turn and touch in the pobars Gait again out to car  10/19/22 Walk outside half way around parking [place] and a rest, then around our building back through the front door,  Volley ball sitting and standing, ball kicks sitting and standing In pbars marching and hip abduction 12" toe clears Tband on floor working on longer steps Sit to stand elevated surface cues needed Stairs step over step up and down Gait outside again cues and CGA  10/13/22 Walk outside with wife and patient, had wife do the HHA and the CGA, gave her instruction in safety and verbal cues as well as tactile cues HS curls 20# 3x10 Leg extension 5# 3x10 Leg press 20# 2x10 5# single arm pulls 4" step to his left working on picking leg up and clearing Gait again 4 x 100 feet with me more in front of him and him trying to put his feet where I am, this seemed to help with the step length  10/11/22 Walk outside HHA on the right, cues for step length 2 rests gait was in the grass, on asphalt and up/dwn curbs Nustep level 5 x 6 minutes Volleyball in sitting Gait inside 100 feet with the right hand HHA x 6 Side stepping, fwd and backward walking 2# marches 2# LAQ 2# hip flexion and abduction up onto a 6" step while sitting Sit to stand was difficult for him  10/06/22 STm to the left neck and upper trap area 2.5# LAQ 2.5# marches Red tband HS curls Red  tband hip adduction Red tband hip abduction Red tband ankle exercises Nustep level 4 x 6 minutes Gait outside with HHA on the right 3x150'  09/29/22 Gait outside total 600 feet with 2 rests some walking in grass, uneven terrain See TUG, 5X STS above Worked on bed mobility and on our harder surfaces he is independent with in and out of bed and can  position himself Volleyball  Side stepping, backward walking  PATIENT EDUCATION: Education details: POC Person educated: Patient and Spouse Education method: Explanation Education comprehension: verbalized understanding  HOME EXERCISE PROGRAM: TBD  GOALS: Goals reviewed with patient? Yes  SHORT TERM GOALS: Target date: 07/19/22  I with initial HEP Baseline: Goal status: working with wife and may get caregiver to do 11/02/22  2. Decrease 5 x STS to < 25 sec to demonstrate improved functional strength  Baseline: 38 sec  Goal status: 08/09/22 14 sec-met  3. Decrease TUG to < 35 sec to demonstrate improved functional balance  Baseline: 47 sec  Goal status: 20 seconds 11/02/22  4. Perform sit <> stand transfers from average chair height with S and VC to be able to use the swivel chair at his dining table.  Baseline: Min A  Goal status: 09/16/22-Inconsistently performs  with S from average height. At times requires CGA to cue forward weight shift. ongoing  LONG TERM GOALS: Target date: 09/29/22  Patient will be able to step over his tub at home using grab bars for stability with CGA to access his shower. Baseline: Unable- has bench, but wife reports he steps over then sits. Goal status: 09/16/22-met  2.  Decrease 5 x STS to < 18 sec to demonstrate improved functional strength Baseline:  Goal status: not met 62 seconds and able to do only 3 09/29/22  3.  Decrease TUG to < 25 sec to demonstrate improved functional balance Baseline:  Goal status: 11/02/22 20 seconds  4.  Patient will perform his bed mobility with MI. Baseline: Min A Goal status: on hard surface mat he is independent but on soft bed at home he struggles, wife reports only needs a hand to get up now 11/02/22  5.  Patient will ambulate at least 400' with LRAD, CGA, demonstrate improved upright posture, longer step length. Baseline:  Goal status: 09/16/22-Amb >400', but began to fatigue after about 300' with more flexed  posture, L foot shuffling more, unable to maintain safe technique. ongoing  6.  Patient will manage up and down a single step with LRAD, CGA to access his deck at home. Baseline: Unable Goal status: 09/16/22-Up and down 1 step with U rail. met  ASSESSMENT:  CLINICAL IMPRESSION: Patient has continued to have some progress in his independence and safety, decrease in TUG time, better ability to transfer sit to stand and with bed mobility.  He has not had any falls and good or bad his wife reports that at home he is getting up sometimes on his own and walkingin the house OBJECTIVE IMPAIRMENTS: Abnormal gait, decreased activity tolerance, decreased balance, decreased cognition, decreased coordination, decreased endurance, decreased mobility, difficulty walking, decreased ROM, decreased strength, decreased safety awareness, impaired flexibility, impaired UE functional use, improper body mechanics, and postural dysfunction.   ACTIVITY LIMITATIONS: carrying, lifting, bending, sitting, standing, squatting, stairs, transfers, bed mobility, bathing, toileting, dressing, hygiene/grooming, and locomotion level  PARTICIPATION LIMITATIONS: meal prep, cleaning, laundry, interpersonal relationship, shopping, and community activity  PERSONAL FACTORS: Age, Past/current experiences, and 3+ comorbidities: CVA, Parkinson's traits, dementia  are also affecting  patient's functional outcome.   REHAB POTENTIAL: Good  CLINICAL DECISION MAKING: Evolving/moderate complexity  EVALUATION COMPLEXITY: Moderate  PLAN:  PT FREQUENCY: 2x/week  PT DURATION: 12 weeks  PLANNED INTERVENTIONS: Therapeutic exercises, Therapeutic activity, Neuromuscular re-education, Balance training, Gait training, Patient/Family education, Self Care, Joint mobilization, Stair training, Moist heat, and Manual therapy  PLAN FOR NEXT SESSION:Continue to work on his balance and function  , independence, safety and overall quality of life Encounter  Date: 11/02/2022   Jearld Lesch, PT 11/02/2022, 1:14 PM  [facility] [address] [facility]. [place], [place], [address] Phone: [phone]   Fax:  [phone]

## 2022-11-03 ENCOUNTER — Other Ambulatory Visit (HOSPITAL_BASED_OUTPATIENT_CLINIC_OR_DEPARTMENT_OTHER): Payer: Self-pay

## 2022-11-03 ENCOUNTER — Ambulatory Visit: Payer: PPO | Admitting: Occupational Therapy

## 2022-11-03 DIAGNOSIS — I69318 Other symptoms and signs involving cognitive functions following cerebral infarction: Secondary | ICD-10-CM

## 2022-11-03 DIAGNOSIS — M6281 Muscle weakness (generalized): Secondary | ICD-10-CM | POA: Diagnosis not present

## 2022-11-03 DIAGNOSIS — R2689 Other abnormalities of gait and mobility: Secondary | ICD-10-CM

## 2022-11-03 DIAGNOSIS — R278 Other lack of coordination: Secondary | ICD-10-CM

## 2022-11-03 DIAGNOSIS — R41840 Attention and concentration deficit: Secondary | ICD-10-CM

## 2022-11-03 DIAGNOSIS — I69354 Hemiplegia and hemiparesis following cerebral infarction affecting left non-dominant side: Secondary | ICD-10-CM

## 2022-11-03 MED ORDER — CLOBETASOL PROPIONATE 0.05 % EX CREA
1.0000 | TOPICAL_CREAM | Freq: Two times a day (BID) | CUTANEOUS | 3 refills | Status: DC | PRN
  Filled 2022-11-03 – 2022-11-26 (×2): qty 60, 30d supply, fill #0
  Filled 2022-12-26: qty 60, 30d supply, fill #1
  Filled 2023-10-25: qty 60, 30d supply, fill #2

## 2022-11-03 NOTE — Patient Instructions (Signed)
Tips for freezing episodes  Do not try to push through the freeze  If going through a doorway, try counting Trying march in place, rock side to side or move your arms to break out of the freeze

## 2022-11-03 NOTE — Therapy (Addendum)
OUTPATIENT OCCUPATIONAL THERAPY TREATMENT NOTE  Patient Name: William [name] MRN: [id number] DOB:June 09, 1941, 82 y.o., male Today's Date: 11/03/2022  PCP & REFERRING PROVIDER:  [name], [name], DO    END OF SESSION: visit 29/30 Time in 1533 Time out 1613                   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   HTN (hypertension) 07/19/2015   Hyperlipidemia    Major neurocognitive disorder 01/09/2014   Mild, related to stroke history   Nocturia    Renal insufficiency 06/25/2013   S/P carotid endarterectomy    BILATERAL ICA--  PATENT PER DUPLEX  05-19-2012   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/26/2017   Right calf   Urinary frequency    Vitamin D deficiency    Past Surgical History:  Procedure Laterality Date   APPENDECTOMY  AS CHILD   CARDIOVASCULAR STRESS TEST  03-27-2012  DR CRENSHAW   LOW RISK LEXISCAN STUDY-- PROBABLE NORMAL PERFUSION AND SOFT TISSUE ATTENUATION/  NO ISCHEMIA/ EF 51%   CAROTID ENDARTERECTOMY Bilateral LEFT  11-12-2008  DR GREG HAYES   RIGHT ICA  2006  ([facility])   CHOLECYSTECTOMY N/A 02/23/2022   Procedure: LAPAROSCOPIC CHOLECYSTECTOMY;  Surgeon: Quentin Ore, MD;  Location: [facility] OR;  Service: General;  Laterality: N/A;   CYSTOSCOPY W/ RETROGRADES Bilateral 06/22/2021   Procedure: CYSTOSCOPY WITH RETROGRADE PYELOGRAM;  Surgeon: Marcine Matar, MD;  Location: [facility];  Service: Urology;  Laterality: Bilateral;   CYSTOSCOPY WITH LITHOLAPAXY N/A 02/26/2013   Procedure: CYSTOSCOPY WITH  LITHOLAPAXY;  Surgeon: Marcine Matar, MD;  Location: [facility];  Service: Urology;  Laterality: N/A;   ENDOSCOPIC RETROGRADE CHOLANGIOPANCREATOGRAPHY (ERCP) WITH PROPOFOL N/A 02/22/2022   Procedure: ENDOSCOPIC RETROGRADE CHOLANGIOPANCREATOGRAPHY (ERCP) WITH PROPOFOL;  Surgeon: Jeani Hawking, MD;  Location: [facility] ENDOSCOPY;  Service: Gastroenterology;  Laterality: N/A;   EYE SURGERY  Jan. 2016   cataract surgery both eyes   INGUINAL HERNIA REPAIR Right 11-08-2006   IR KYPHO EA ADDL LEVEL THORACIC OR LUMBAR  02/12/2021   IR RADIOLOGIST EVAL & MGMT  02/18/2021   MASS EXCISION N/A 03/03/2016   Procedure: EXCISION OF BACK  MASS;  Surgeon: Almond Lint, MD;  Location: [facility];  Service: General;  Laterality: N/A;   MOHS SURGERY Left 1/ 2016   Dr Margo Aye-- Basal cell   PROSTATE SURGERY     REMOVAL OF STONES  02/22/2022   Procedure: REMOVAL OF STONES;  Surgeon: Jeani Hawking, MD;  Location: [facility] ENDOSCOPY;  Service: Gastroenterology;;   Dennison Mascot  02/22/2022   Procedure: Dennison Mascot;  Surgeon: Jeani Hawking, MD;  Location: [facility] ENDOSCOPY;  Service: Gastroenterology;;   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-C N/A 06/22/2021   Procedure: TRANSURETHRAL RESECTION OF BLADDER TUMOR;  Surgeon: Marcine Matar, MD;  Location: [facility];  Service: Urology;  Laterality: N/A;   TRANSURETHRAL RESECTION OF PROSTATE N/A 02/26/2013   Procedure: TRANSURETHRAL RESECTION OF THE PROSTATE WITH GYRUS  INSTRUMENTS;  Surgeon: Marcine Matar, MD;  Location: [facility];  Service: Urology;  Laterality: N/A;   TRANSURETHRAL RESECTION OF PROSTATE N/A 06/22/2021   Procedure: TRANSURETHRAL RESECTION OF THE PROSTATE (TURP);  Surgeon: Marcine Matar, MD;  Location: [facility];  Service: Urology;  Laterality: N/A;   Patient Active Problem List   Diagnosis Date Noted   Dysuria 08/03/2022   Nonintractable headache 07/01/2022    Bilateral impacted cerumen 06/24/2022   Rash 06/15/2022   Postoperative ileus (HCC) 02/27/2022   Ileus, postoperative (HCC) 02/26/2022   Choledocholithiasis 02/19/2022   DNR (do not resuscitate) 02/19/2022   Left elbow pain 01/26/2022   Mid back pain on left side 01/26/2022   Rib pain 01/26/2022   Acute pain of left shoulder 11/12/2021   Leukocytes in urine 11/12/2021   Urinary frequency 11/12/2021   Thrush 10/08/2021   Hemiplegia, 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   Insomnia    Prediabetes    Acute 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 (HCC)    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   Transaminitis 07/27/2021   UTI (urinary tract infection) 07/27/2021   CVA (cerebral vascular accident) (HCC) 07/27/2021   Fall 07/27/2021   Hyperglycemia 07/27/2021   Cholelithiasis 07/27/2021   Hypoxia 07/27/2021   Nausea and vomiting 07/27/2021   Acute metabolic encephalopathy 07/27/2021   Normocytic anemia 07/27/2021   Chronic back pain 07/27/2021   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   Closed fracture of first lumbar vertebra with routine healing 02/03/2021   Closed fracture of multiple ribs 11/18/2020   Anxiety 01/29/2020   Leg pain, bilateral 01/29/2020   Ingrown toenail 07/13/2019   Lumbar spondylosis 05/02/2018   Pain in left knee 03/09/2018   Osteoarthritis of left hip 01/16/2018   Trochanteric bursitis of left hip 01/16/2018   Preventative health care 09/26/2017   HTN (hypertension) 07/19/2015   Hyperlipidemia 07/19/2015   Great toe pain 02/11/2014   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9 (ICD-10-CM) - Cerebrovascular accident (CVA), unspecified mechanism (HCC)  W19.XXXD (ICD-10-CM) - Fall, subsequent encounter    THERAPY DIAG:  Muscle weakness (generalized)  Hemiplegia and hemiparesis following cerebral infarction affecting left non-dominant side (HCC)  Other lack of coordination  Attention and concentration deficit  Other symptoms and signs involving cognitive functions following cerebral infarction  Other abnormalities of gait and mobility  Rationale for Evaluation and Treatment: Rehabilitation  PERTINENT HISTORY: PMHx includes L basal ganglia and corona radiata infarct 07/27/21, L lower homonymous quadrantanopia; R MCA infarct in 2006 w/ L-sided hemiparesis, NCD, carotid artery disease (bilateral), HTN, HLD,CKD3, vascular dementia, and hx of bladder cancer. He has had falls in 2023, fracturing his clavicle in August, then he had stomach surgery in Sept 2023. He has He needs assist at baseline for ADLs. When last documented in acute rehab after having his gall-bladder  removed, (03/09/22) he required moderate to maximum assist x2 for most ADLs (more assist for ADLs the require standing), and they recommended rehab in skilled nursing setting. He apparently got Covid at some point and also attended a SNF. Once he D/C home, he had some episodes of combativeness at night  with "sun-downing."  He is communicative and appropriate, though appears easily confused and distracted, not fully oriented to time and place (Ax2, states date is 55 and cannot say what city he lives in). His wife provides most details, and he is participative, though a poor historian at times. In a  nut shell- he has been suffering from dementia and post-stroke effects for years, and his issues were recently exacerbated in Sept of last year after abdominal surgery. He has less use of Lt "stroke side" and new, ritualistic "ticks" in mouth and Rt hand/arm.  Wife states that they do have a caregiver come M, W, F for 4 hours in the middle of the day to mainly help him bathe and dress, which usually tires him out. He has been more combative towards the end of the day as well.   PRECAUTIONS: Fall and Other: Lt hemi, poor cognition, LATEX and adhesive allergies, etc.  ;WEIGHT BEARING RESTRICTIONS: No   SUBJECTIVE:   SUBJECTIVE STATEMENT: Doing ok Pt accompanied by:  his wife   PAIN: no     FALLS: Has patient fallen in last 6 months? Yes. Number of falls at least 1 resulting in clavicle fx (now healed)   LIVING ENVIRONMENT: Lives with: lives with their spouse Lives in: House/apartment Has following equipment at home: manual w/c, 2 w/w, weighted utensils, shower bench (for tub), bed cane, hospital bed, raised toilet, and possibly more  PLOF: Needs assistance with ADLs, Needs assistance with homemaking, Needs assistance with gait, and Needs assistance with transfers  PATIENT GOALS: at eval- increase personal hygiene skills (brush teeth, shaving)  prepare instant coffee or microwave meal improve functional cognitive skills to play games, etc.,   increase Lt arm usage try to help decrease Rt hand/arm perseveration (flicking, tapping, noise making) Improve fnl tranfers in/out of multiple seats (tub bench, swivel chair, etc.)    OBJECTIVE: (All objective assessments below are from initial evaluation on: 07/07/22 unless otherwise specified.)   HAND DOMINANCE: Right  ADLs: Overall ADLs: Wife states decreased ability since hospitalization in Sept 23.  Transfers/ambulation related to ADLs: Min A transfer, CGA for mobility Eating: set up Grooming: Mod A- needs assist with oral care, shaving,  but can comb his hair UB Dressing: Min A  LB Dressing: Dep  Toileting:  Mod  Bathing: Mod  Tub Shower transfers: Min A  Equipment: Emergency planning/management officer, Grab bars, and Feeding equipment  IADLs: completely dependent for years now, except some ability to use microwave which is now not possible after hospitalization   MOBILITY STATUS: Needs Assist: CGA - Min A, Hx of falls, Festination, difficulty with turns, and neglect on Lt side  POSTURE COMMENTS:  rounded shoulders, forward head, and weight shift left  ACTIVITY TOLERANCE: Activity tolerance: he became tired and somewhat agitated by the end of the OT evaluation today (after PT and OT about 2.5hours with mostly sedate activities in seated positions)  FUNCTIONAL OUTCOME MEASURES: [facility] 17/30  showing problems with visuospatial/executive function, attention, fluency, and orientation. (at least 26/30 is needed to be considered "normal")  Trinna Balloon in ADLs: 1/6  (very dependent)  UPPER EXTREMITY ROM:    Active ROM Right eval Left eval  Shoulder scaption 100 85  Shoulder abduction    Shoulder adduction    Shoulder extension    Shoulder internal rotation Reaches small of back Reaches to hip  Shoulder external rotation Reaches back of head  Reaches up to ear  Elbow flexion 150 130  Elbow extension (-10*)  (-35*)  Wrist flexion 35 20  Wrist extension 50 10  (Blank rows = not tested)  UPPER EXTREMITY MMT:    Tested grossly as "push" and "pull": UE push 4-/5, UE pull 4/5  HAND FUNCTION: Grip strength: Right: 30 lbs; Left: 20 lbs  COORDINATION: Finger Nose Finger test: mildly ataxic but fairly accurate  Box and Blocks:  Right 11blocks, Left 5blocks  MUSCLE TONE:  Rt- normal, Lt- decreased and worsened by inattention/disuse  COGNITION: Overall cognitive status: Impaired and Difficulty to assess due to: severity of deficits; Poor attention, fluency, orientation, and executive function. Recall is fairly good as well as  abstraction.  Seems to have personality changes due to vascular dementia- like an OCD type volitional perseveration of tapping with Rt hand that he can somewhat control.  He did become angry and agitated at the end of the session performing car transfer with spouse: he did shout at her.  VISION: Subjective report: no new changes per pt/spouse, uses "cheaters"   VISION ASSESSMENT: Visual fields seem intact though he had latent responses on the left side showing likely inattention/and/neglect, tracking somewhat delayed horizontally and vertically  PERCEPTION: Impaired: Inattention/neglect: does not attend to left visual field and does not attend to left side of body, Body scheme: poor quality of recognition with shaving, etc., and Spatial orientation: decreased in print and in orientation, especially to Lt side  PRAXIS: Not tested specifically but no overt/stated issues using pen, wearing glasses ,etc.   OBSERVATIONS: Pt taps and flicks and does seem to volitionally perseverate on making a rhythm/noise with Rt hand. He states he is aware of it and chooses to do it.  He does also seem to have mild resting and intention tremor.    TODAY'S TREATMENT:     DATE:11/03/22-Pt's wife report pt had a freezing episode at countertop today.  Education provided regarding suggestions for freezing episodes.  Standing at countertop dynamic step and reach to toss beanbags to target with right and left UE's mod v.c and facilitation. Seated at table flipping playing cards with LUE, mod v.c for larger amplitude. Placing and removing wooden dowel pegs from pegboard with right and left UE's, mod difficulty and min-mod v.c for LUE functional.  Therpist discussed plans for d/c end of June with pt and wife.  11/01/22- Seated closed chain shoulder flexion,reaching to floor. Boomwhackers for bilateral UE following sequence and verbal instructions, min v.c Cueing for backing up to mat and walking through doorways. Standing  functional reaching with left and right UE's, mod v.c for LUE functional use Constant therapy activities to address the following: following directions, speech volume, and  verbal comprehension, mod -max v.c and facilitation for tasks. Pt demonstrates impulsivity during activities.        PATIENT EDUCATION: Education details: Education regarding tips for freezing episodes  Person educated: Patient and Spouse Education method: Explanation, Demonstration, Actor cues, and Verbal cues, handout Education comprehension: Explanation, demonstration, v.c  HOME EXERCISE PROGRAM: Access Code: [email or web address] URL: [email or web address] Date: 07/21/2022 Prepared by: Fannie Knee Exercises - Seated Shoulder Flexion AAROM with Dowel  - 4-6 x daily - 1 sets - 10-15 reps - Seated Shoulder Abduction AAROM with Dowel  - 4-6  x daily - 1 sets - 10-15 reps - Seated Shoulder External Rotation AAROM with Dowel  - 4-6 x daily - 1 sets - 10-15 reps - Seated Bicep Curls with Bar  - 4-6 x daily - 1 sets - 10-15 reps - Seated Shoulder Row with Anchored Resistance  - 4-6 x daily - 1 sets - 10-15 reps     GOALS: Goals reviewed with patient? Yes   SHORT TERM GOALS: (STG required if POC>30 days) Target Date: 09/23/22   Pt will demo/state understanding of initial HEP (or home activity suggestions to maintain function as appropriate) to improve functional skills. Goal status: 07/28/22- Met (addressed in past 2 sessions) 2. Pt will donn a t-shirt consistently with min A and v.c   Goal status: met , per wife's report 3. Pt will demonstrate ability to open the newspaper  with supervision min v.c Goal status:  ongoing, not consistently performing at home 11/03/22 4.  Pt will consistently sustain focus to a simple functional task  in a minimally distracting environment x 6 mins with no more than 1 re-direct(ie: eating, functional task in clinic)  Goal status: partially met, pt demonstrates ability to  perform in clinic when engaged in a task that is meaningful to him 10/26/22  5. Pt will consistently open containers with only supervision and min v.c           Goal status: Pt is performing at home per wife's report 11/03/22  LONG TERM GOALS: Target Date: 11/16/22  Pt will improve functional ability by decreased impairment per Trinna Balloon assessment from 1/6 to 2/6 or better, for better quality of life. Goal status:  not met 1/6  2.  Caregiver will state his grooming ability/hygiene ability improved from moderate assist to supervision assist. Goal status: not met-Pt continues to require mod A.  3.  He will show better functional use of left arm by improved box and blocks Test score from 5 blocks to at least 10 blocks. Goal status:not met but improved 8 blocks  4.  Pt will show ability to microwave instant coffee or small meal safely, to help decrease caregiver burden. Goal status: partially met, pt is able to microwave coffee with supervision, however occasional min A to open containers 5.  He will show improved cognitive ability enough to play a simple game with OT and caregiver. Goal status: approximating goal, pt has played a game of uno at home with family.  6.  Caregiver will state at least 4 ways to decrease perseveration with right arm, also stating that this behaviors at least somewhat better in the home now. Goal status: deferred perseverative behaviors have improved.  7. Pt will donn a flannel button front shirt consistently with min A and min v.c Baseline: mod-max Goal status:  ongoing, inconsitent min-mod v.c 09/15/22, pt doffed today with supervision and v.c 10/12/22 8.  Pt will use his LUE as an active assist for ADLS grossly 40% of the time with min v.c Baseline: uses 25% Goal status:  ongoing inconsistent use of RUE 10/12/22  9 Pt will stand at the countertop and perform functional activities  incorporating bilateral UE's with supervision and min v.c  Goal status:   ongoing, min assist at time-09/15/22 10. Pt will back up to a chair and sit down squarely at least 1/3 of the time with min v.c  Goal status: ongoing , performs in clinic inconsistently min-mod v.c 10/27/22 11..  Pt will open the newspaper and locate sports section with  no more than min v.c                          Goal status:  ongoing, min-mod A at times 09/15/22  ASSESSMENT:  CLINICAL IMPRESSION: Pt is progressing towards goals. Pt's wife reports pt had a freezing episode this morning. Tips for freezing provided.   OT Frequency/DURATION: 2x week x 12 weeks however anticipate d/c after 8 weeks dependent on progress  PLANNED INTERVENTIONS: self care/ADL training, therapeutic exercise, therapeutic activity, neuromuscular re-education, manual therapy, balance training, functional mobility training, moist heat, cryotherapy, patient/family education, cognitive remediation/compensation, visual/perceptual remediation/compensation, psychosocial skills training, energy conservation, coping strategies training, DME and/or AE instructions, and Re-evaluation  RECOMMENDED OTHER SERVICES: He has already seen PT for balance and functional mobility, he would probably benefit from speech for cognitive issues as well, though OT will be working with cognition somewhat functionally now.  CONSULTED AND AGREED WITH PLAN OF CARE: Patient and family member/caregiver  PLAN FOR NEXT SESSION:  Check goals, anticipate d/c end of June, Pipe tree design  William [name], OTR/L 4:36 PM 11/03/22

## 2022-11-04 ENCOUNTER — Ambulatory Visit: Payer: PPO | Admitting: Physical Therapy

## 2022-11-04 ENCOUNTER — Encounter: Payer: Self-pay | Admitting: Physical Therapy

## 2022-11-04 DIAGNOSIS — I69354 Hemiplegia and hemiparesis following cerebral infarction affecting left non-dominant side: Secondary | ICD-10-CM

## 2022-11-04 DIAGNOSIS — M6281 Muscle weakness (generalized): Secondary | ICD-10-CM

## 2022-11-04 DIAGNOSIS — R278 Other lack of coordination: Secondary | ICD-10-CM

## 2022-11-04 DIAGNOSIS — R262 Difficulty in walking, not elsewhere classified: Secondary | ICD-10-CM

## 2022-11-04 DIAGNOSIS — R296 Repeated falls: Secondary | ICD-10-CM

## 2022-11-04 NOTE — Therapy (Signed)
OUTPATIENT PHYSICAL THERAPY NEURO Treatment   Patient Name: William [name] DOB:July 15, 1940, 82 y.o., male Today's Date: 11/04/2022  PCP: Donato Schultz  DO REFERRING PROVIDER: same  END OF SESSION:  PT End of Session - 11/04/22 1351     Visit Number 34    Date for PT Re-Evaluation 12/03/22    Authorization Type HTA    PT Start Time 1349    PT Stop Time 1439    PT Time Calculation (min) 50 min    Equipment Utilized During Treatment Gait belt    Activity Tolerance Patient tolerated treatment well    Behavior During Therapy WFL for tasks assessed/performed                 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   HTN (hypertension) 07/19/2015   Hyperlipidemia    Major neurocognitive disorder 01/09/2014   Mild, related to stroke history   Nocturia    Renal insufficiency 06/25/2013   S/P carotid endarterectomy    BILATERAL ICA--  PATENT PER DUPLEX  05-19-2012   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   Right calf   Urinary frequency    Vitamin D deficiency    Past Surgical History:  Procedure Laterality Date   APPENDECTOMY  AS CHILD   CARDIOVASCULAR STRESS TEST  03-27-2012  DR CRENSHAW   LOW RISK LEXISCAN STUDY-- PROBABLE NORMAL PERFUSION AND SOFT TISSUE ATTENUATION/  NO ISCHEMIA/ EF 51%   CAROTID ENDARTERECTOMY Bilateral LEFT  11-12-2008  DR GREG HAYES   RIGHT ICA  2006  ([facility])   CHOLECYSTECTOMY N/A 02/23/2022   Procedure: LAPAROSCOPIC CHOLECYSTECTOMY;  Surgeon: Quentin Ore, MD;  Location: [facility];  Service:  General;  Laterality: N/A;   CYSTOSCOPY W/ RETROGRADES Bilateral 06/22/2021   Procedure: CYSTOSCOPY WITH RETROGRADE PYELOGRAM;  Surgeon: Marcine Matar, MD;  Location: [facility];  Service: Urology;  Laterality: Bilateral;   CYSTOSCOPY WITH LITHOLAPAXY N/A 02/26/2013   Procedure: CYSTOSCOPY WITH LITHOLAPAXY;  Surgeon: Marcine Matar, MD;  Location: [facility];  Service: Urology;  Laterality: N/A;   ENDOSCOPIC RETROGRADE CHOLANGIOPANCREATOGRAPHY (ERCP) WITH PROPOFOL N/A 02/22/2022   Procedure: ENDOSCOPIC RETROGRADE CHOLANGIOPANCREATOGRAPHY (ERCP) WITH PROPOFOL;  Surgeon: Jeani Hawking, MD;  Location: [facility] ENDOSCOPY;  Service: Gastroenterology;  Laterality: N/A;   EYE SURGERY  Jan. 2016   cataract surgery both eyes   INGUINAL HERNIA REPAIR Right 11-08-2006   IR KYPHO EA ADDL LEVEL THORACIC OR LUMBAR  02/12/2021   IR RADIOLOGIST EVAL & MGMT  02/18/2021   MASS EXCISION N/A 03/03/2016   Procedure: EXCISION OF BACK  MASS;  Surgeon: Almond Lint, MD;  Location: [facility];  Service: General;  Laterality: N/A;   MOHS SURGERY Left 1/ 2016   Dr Margo Aye-- Basal cell   PROSTATE SURGERY     REMOVAL OF STONES  02/22/2022   Procedure: REMOVAL OF STONES;  Surgeon: Jeani Hawking, MD;  Location: [facility] ENDOSCOPY;  Service: Gastroenterology;;   Dennison Mascot  02/22/2022  Procedure: SPHINCTEROTOMY;  Surgeon: Jeani Hawking, MD;  Location: [facility] ENDOSCOPY;  Service: Gastroenterology;;   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-C N/A 06/22/2021   Procedure: TRANSURETHRAL RESECTION OF BLADDER TUMOR;  Surgeon: Marcine Matar, MD;  Location: [facility];  Service: Urology;  Laterality: N/A;   TRANSURETHRAL RESECTION OF PROSTATE N/A 02/26/2013   Procedure: TRANSURETHRAL RESECTION OF THE PROSTATE WITH GYRUS INSTRUMENTS;  Surgeon: Marcine Matar, MD;  Location: [facility];  Service: Urology;  Laterality: N/A;   TRANSURETHRAL RESECTION OF  PROSTATE N/A 06/22/2021   Procedure: TRANSURETHRAL RESECTION OF THE PROSTATE (TURP);  Surgeon: Marcine Matar, MD;  Location: [facility];  Service: Urology;  Laterality: N/A;   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   Insomnia    Prediabetes    Acute 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 (HCC)    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   Transaminitis 07/27/2021   UTI (urinary tract infection) 07/27/2021   CVA (cerebral vascular accident) (HCC) 07/27/2021   Fall 07/27/2021   Hyperglycemia 07/27/2021   Cholelithiasis 07/27/2021   Hypoxia 07/27/2021   Nausea and vomiting 07/27/2021   Acute metabolic encephalopathy 07/27/2021   Normocytic anemia 07/27/2021   Chronic back pain 07/27/2021   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   Closed fracture of first lumbar vertebra with routine healing 02/03/2021   Closed fracture of multiple ribs 11/18/2020   Anxiety 01/29/2020   Leg pain, bilateral 01/29/2020   Ingrown toenail 07/13/2019   Lumbar spondylosis 05/02/2018   Pain in left knee 03/09/2018   Osteoarthritis of left hip 01/16/2018   Trochanteric bursitis of left hip 01/16/2018   Preventative health care 09/26/2017   HTN (hypertension) 07/19/2015   Hyperlipidemia 07/19/2015   Great toe pain 02/11/2014   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   Obesity (BMI 30-39.9) 06/25/2013   Renal insufficiency 06/25/2013   Weakness of left arm 06/25/2013   Sebaceous cyst 03/03/2011   Sprain of lumbar region 07/31/2010   Rib pain, left 08/29/2009   Carotid artery stenosis, asymptomatic, bilateral 05/02/2009   Eczema, atopic 05/31/2008   Vitamin D deficiency 03/01/2008   BPH (benign prostatic hyperplasia) 08/06/2007   Fasting hyperglycemia 12/21/2006   History of right MCA infarct 2006    ONSET DATE: 06/29/22  REFERRING DIAG:  Diagnosis  I63.9 (ICD-10-CM) - Cerebrovascular accident (CVA), unspecified mechanism (HCC)  W19.XXXD (ICD-10-CM) - Fall, subsequent encounter    THERAPY DIAG:  Muscle weakness (generalized)  Hemiplegia and hemiparesis following cerebral infarction affecting left non-dominant side (HCC)  Other lack of coordination  Difficulty in walking, not elsewhere classified  Repeated falls  Rationale for Evaluation and Treatment: Rehabilitation  SUBJECTIVE:  SUBJECTIVE STATEMENT:  No falls wife reports that he has been walking a little better PERTINENT HISTORY:  KANIEL BOTLEY is a 82 year old man with dementia, CKD, HTN, CAD, HLD and history of CVA  (2006, 2021), Parkinson's  PAIN:  Are you having pain? No  PRECAUTIONS: Fall  WEIGHT BEARING RESTRICTIONS: No  FALLS: Has patient fallen in last 6 months? No  LIVING ENVIRONMENT: Lives with: lives with their family and lives with their spouse Lives in: House/apartment Stairs:  1 brick high step Has following equipment at home: Environmental consultant - 2 wheeled, Wheelchair (manual), Shower bench, bed side commode, Grab bars, and hospital bed, sliding pad for car  PLOF: Independent with basic ADLs and Independent with household mobility without device  PATIENT GOALS:  Walk around the block with his wife. Be able to use the dining room chair rather than have to use the W/C. Increased I with bed mobility and simple tasks at home like make some coffee or brush his teeth, Step over tub to shower, has bench, but he can't really use it. Up and down the one step to get to back deck.  OBJECTIVE:   DIAGNOSTIC FINDINGS:  MRI 2021 with degenerative changes in lumbar spine, L3-4 R subarticular stenosis  COGNITION: Overall cognitive status: History of cognitive impairments - at baseline   SENSATION: Not tested  COORDINATION: Moderately impaired BLE   MUSCLE LENGTH: Hamstrings: severely restricted B Thomas test: Severely restricted B.  POSTURE: rounded shoulders, forward head, increased thoracic kyphosis, posterior pelvic tilt, and flexed trunk   LOWER EXTREMITY ROM:   BLE extremely stiff throughout, limited hip ROM in all planes, B knees limited in extension.   LOWER EXTREMITY MMT:  3+/5 throughout. Unable to determine accuracy of MMT due to cognition. Functionally demosntrates poor coordinated activation and poor muscular endurance.   BED MOBILITY: Min A for Supine<> Sit. Patient was using a ladder in his hospital bed and could move MI.  TRANSFERS: Min A, mod VC, tends to lean back.  11/02/22 independent with use of hands on arm chair with a couple of tries  CURB: Min A-Has one step out to his deck.  STAIRS: N/A   GAIT: Gait pattern: step to pattern, decreased arm swing- Right, decreased arm swing- Left, decreased step length- Right, decreased step length- Left, shuffling, festinating, trunk flexed, narrow BOS, poor foot clearance- Right, and poor foot clearance- Left Distance walked: 53' Assistive device utilized: Environmental consultant - 2 wheeled Level of assistance: Occasional min A Comments: mod VC to stay close to walker, take longer steps.  FUNCTIONAL TESTS:  5 times sit to stand: 38.23  09/29/22  could not performs 5XSTS, 11/02/22 = 47 seconds with cues to  use arms Timed up and go (TUG): 47.91  with RW, 07/21/22 TUG 29  seconds without device, 09/29/22 25 seconds, 11/02/22 TUG 20 seconds  TODAY'S TREATMENT:  DATE:  11/04/22 UBE level 4 x 6 mintues Gait outside light HHA, some cues for bigger steps Supine bridges HS stretches Trunk rotation in supine Bed mobility scooting to one side and then back Volley ball Ball kicks Hip abduction Gait outside again, curbs slopes and then toe touches on the curb alternating  11/02/22 Nustep level 5 x 6 minutes Gait outside with gait belt and HHA, cues verbal and tactile for speed and step length Ball tapping sitting for core and standing for balance Func reaching and carrying for balance Navigating on and over obstalces cg-min with cuing to get closer ot object before he steps- he tends to step from too far away from object STS working on leg strength holding ball 10x Step taps 6 inc 10 x each leg then alt increased LOB Gait outside HHA/CGA better steps today   10/28/22 Amb outside with gait belt and HHA with gentle pull to work on increased cadence and step thru- stressing with shuffled steps and leaning fwd Walking in clinic looking for cones  and working on scanning, turns and navigating obstacles  Keener tap standing and sitting balloon taps Obstacle course navigation over and around- min A with cuing for safety to get closer before stepping on or over HS curl 2 sets 10 20# Knee ext 2 sets 10 10# Alt taps 6 inch 20 x 2 sets CGA Ball toss working on 2 hand catch STS with wt ball press working on 2 hands slightly elevated mat  10/25/21 Nustep level 5 x 6 mintues Folding towels from low surface and then stacking towels up on a high surface at his side so he was having to turn, then we carried it and stacked it on a shelf 2.5# LAQ's 2.5# marches 2.5# hip abduction  standing Green tband ankle PF/DF Green tband HS curls Standing with HHA cone toe touches Gait HHA with slight pull to get faster and bigger steps Volleyball in standing  10/21/22 Gait outside 1/2 parking [place] Folding towels from low surface to high surface with turn while standing, carry towels and stack Volley ball sitting and standing Ball kicks On mat table bridges, rolls, green tband clamshells, partial sit ups Got him in quadraped raise one arm and then leg, difficulty getting gout of this position Step and turn and touch in the pobars Gait again out to car  10/19/22 Walk outside half way around parking [place] and a rest, then around our building back through the front door,  Volley ball sitting and standing, ball kicks sitting and standing In pbars marching and hip abduction 12" toe clears Tband on floor working on longer steps Sit to stand elevated surface cues needed Stairs step over step up and down Gait outside again cues and CGA  10/13/22 Walk outside with wife and patient, had wife do the HHA and the CGA, gave her instruction in safety and verbal cues as well as tactile cues HS curls 20# 3x10 Leg extension 5# 3x10 Leg press 20# 2x10 5# single arm pulls 4" step to his left working on picking leg up and clearing Gait again 4 x 100 feet with me more in front of him and him trying to put his feet where I am, this seemed to help with the step length  10/11/22 Walk outside HHA on the right, cues for step length 2 rests gait was in the grass, on asphalt and up/dwn curbs Nustep level 5 x 6 minutes Volleyball in sitting Gait inside 100 feet with the right  hand HHA x 6 Side stepping, fwd and backward walking 2# marches 2# LAQ 2# hip flexion and abduction up onto a 6" step while sitting Sit to stand was difficult for him  PATIENT EDUCATION: Education details: POC Person educated: Patient and Spouse Education method: Explanation Education comprehension: verbalized  understanding  HOME EXERCISE PROGRAM: TBD  GOALS: Goals reviewed with patient? Yes  SHORT TERM GOALS: Target date: 07/19/22  I with initial HEP Baseline: Goal status: working with wife and may get caregiver to do 11/02/22  2. Decrease 5 x STS to < 25 sec to demonstrate improved functional strength  Baseline: 38 sec  Goal status: 08/09/22 14 sec-met  3. Decrease TUG to < 35 sec to demonstrate improved functional balance  Baseline: 47 sec  Goal status: 20 seconds 11/02/22  4. Perform sit <> stand transfers from average chair height with S and VC to be able to use the swivel chair at his dining table.  Baseline: Min A  Goal status: 09/16/22-Inconsistently performs  with S from average height. At times requires CGA to cue forward weight shift. ongoing  LONG TERM GOALS: Target date: 09/29/22  Patient will be able to step over his tub at home using grab bars for stability with CGA to access his shower. Baseline: Unable- has bench, but wife reports he steps over then sits. Goal status: 09/16/22-met  2.  Decrease 5 x STS to < 18 sec to demonstrate improved functional strength Baseline:  Goal status: not met 62 seconds and able to do only 3 09/29/22  3.  Decrease TUG to < 25 sec to demonstrate improved functional balance Baseline:  Goal status: 11/02/22 20 seconds  4.  Patient will perform his bed mobility with MI. Baseline: Min A Goal status: on hard surface mat he is independent but on soft bed at home he struggles, wife reports only needs a hand to get up now 11/02/22  5.  Patient will ambulate at least 400' with LRAD, CGA, demonstrate improved upright posture, longer step length. Baseline:  Goal status: 09/16/22-Amb >400', but began to fatigue after about 300' with more flexed posture, L foot shuffling more, unable to maintain safe technique. ongoing  6.  Patient will manage up and down a single step with LRAD, CGA to access his deck at home. Baseline: Unable Goal status: 09/16/22-Up and  down 1 step with U rail. met  ASSESSMENT:  CLINICAL IMPRESSION: Again did well today with the walking, I did a much lighter HHA and really did not use the gait belt, he is using the counting strategy going through the doors , still shuffles and struggles through.  He did fatigue today with the second walk, by the end he was shuffling and leaning forward.  I did speak with his wife about the adult day care and what she wants and is looking for.   OBJECTIVE IMPAIRMENTS: Abnormal gait, decreased activity tolerance, decreased balance, decreased cognition, decreased coordination, decreased endurance, decreased mobility, difficulty walking, decreased ROM, decreased strength, decreased safety awareness, impaired flexibility, impaired UE functional use, improper body mechanics, and postural dysfunction.   ACTIVITY LIMITATIONS: carrying, lifting, bending, sitting, standing, squatting, stairs, transfers, bed mobility, bathing, toileting, dressing, hygiene/grooming, and locomotion level  PARTICIPATION LIMITATIONS: meal prep, cleaning, laundry, interpersonal relationship, shopping, and community activity  PERSONAL FACTORS: Age, Past/current experiences, and 3+ comorbidities: CVA, Parkinson's traits, dementia  are also affecting patient's functional outcome.   REHAB POTENTIAL: Good  CLINICAL DECISION MAKING: Evolving/moderate complexity  EVALUATION COMPLEXITY: Moderate  PLAN:  PT FREQUENCY: 2x/week  PT DURATION: 12 weeks  PLANNED INTERVENTIONS: Therapeutic exercises, Therapeutic activity, Neuromuscular re-education, Balance training, Gait training, Patient/Family education, Self Care, Joint mobilization, Stair training, Moist heat, and Manual therapy  PLAN FOR NEXT SESSION:Continue to work on his balance and function  , independence, safety and overall quality of life Encounter Date: 11/04/2022   Jearld Lesch, PT 11/04/2022, 1:52 PM  [facility] at  [facility] [address] [facility]. [place], [place], [address] Phone: [phone]   Fax:  [phone]

## 2022-11-05 NOTE — Progress Notes (Unsigned)
NEUROLOGY FOLLOW UP OFFICE NOTE  PAULINO SARTIN [id number]  Assessment/Plan:     1.   Major vascular neurocognitive disorder, but cannot rule out underlying dementia secondary to Parkinson's disease as well 2.   Parkinson's disease 4.  Hypertension 5.  Hyperlipidemia 6.  History of right CEA 7.  History of stroke   Start carbidopa-levodopa IR 25/100mg  trial, titrate up to 1 tablet three times daily Refer to neurorehab with therapist specializing with Parkinson's patients Provided resources for exercise classes and support groups Follow up 4 months.   Subjective:  William [name] is a 82 year old man with dementia, CKD, HTN, CAD, HLD and history of CVA who follows up for vascular dementia, cerebrovascular disease with history of stroke and parkinsonism.  UPDATE: Current medications:  ASA 81mg , Plavix 75mg ; Norvasc, hydralazine, fenofibrate 160mg , Tegretol-XR 100mg  at bedtime; amantadine 100mg  daily, lorazepam PRN; tizanidine 4mg  Q6h PRN, trazodone 75mg  QHS  DaT scan on 08/05/2022 was equivocal - revealed marked decreased activity in the left striatum, however it correlates with large left basal ganglia infarct as seen on comparison CT.  He has been freezing more frequently.  He also has been stuttering.    HISTORY: 1.  Lumbar spondylosis with radiculopathy Prior to COVID, he was going to the gym and seeing a personal trainer but it stopped once the pandemic hit.  Since the pandemic, he has been sedentary.  He walks the dog once or twice a week.  He started having bilateral leg pain later that year in 2020.  No associated back pain.  No shooting pain.  It is described as an aching in his calves.  No associated numbness.  He also reports weakness.  When he walks, he has trouble picking up his legs.  He seems to shuffle his feet.  When he gets up to stand, he feels like he can fall forward.  He had one fall while standing at the toilet and he bent forward to spit in toilet.  No change  in bowel habit.  He was having urinary issues so he was taken off of Flomax.    Vascular US ABI from 12/08/18 showed abnormal toe-brachial index in both lower extremities but otherwise unremarkable.  MRI of cervical spine without contrast from 03/10/2019 showed some cervical degenerative disc disease and spondylosis with mild foraminal narrowing but no spinal stenosis or compressive myelopathy.  He does have history of low back pain and lumbar spondylosis.  Due to ongoing unsteady gait with bilateral leg pain, MRI of lumbar spine was performed on 11/14/2019, which was personally reviewed and demonstrated degenerative changes stable compared to 2012 with L3-4 right subarticular stenosis.  He has had mixed response to epidural injections.     2.  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  He has some residual left sided weakness and facial weakness.  He underwent left carotid endarterectomy in 2010 for asymptomatic stenosis.   Beginning around 2013, he and his wife have noted a gradual progression and cognitive deficits. Onset correlated during a period when he was experiencing dizziness and syncope secondary to orthostatic hypotension. At that time, he was dehydrated and not drinking much fluids. He was instructed to start increasing his fluid intake as well as his sodium intake. Now, he tries to drink 3 bottles of water per  day. He particularly notes confusion regarding how to perform certain everyday tasks. For example, it takes him a long time to unload the dishwasher because he has trouble figuring out where to put the dishes. He use to enjoy cooking and preparing meals. Now he has difficulty cooking and can only see things up in the microwave. He also has been experiencing dressing apraxia. Sometimes he will put both his  feet into one leg of his pants. Other times, he has difficulty buttoning his shirts. He denies any language dysfunction such as difficulty understanding other people or other people not understanding him. He has no trouble reading or writing. His short-term and long-term memory are pretty good. He denies any difficulty with face recognition. He denies hallucinations or delusions. He has not had any change in his personality or behavior. He still drives but very seldom and only locally. He has not had any problems with accidents or near accidents, but he says he drives slowly because he is very nervous and cautious. He does have history of anxiety and often shakes when he gets nervous. He denies any family history of dementia. However, there is a psychiatric history with his mother and sister.  He denies history of alcohol abuse or drug use.   He underwent neuropsychological testing on 10/25/13 with Dr. Elvis Coil at [facility].  Testing suggested mild dementia due to his stroke, but also underlying neurodegenerative process.  Memory and left hemispheric medial temporal functions were intact.  Findings showed deficits in visual processing and executive dysfunction.  Onset of visual-spatial deficits and apraxia may suggest occipital lobe involvement, such as due to an Alzheimer's variant called posterior cortical atrophy.  MRI from May 2015 revealed global atrophy but it does not seem more prominent in the occipital lobes.  He developed increased confusion in February 2021.  At physical therapy, he was noted to have increased difficulty using his left side (affected side from his stroke).  He had difficulty performing finger-thumb tapping and had trouble gripping the handle of the exercise machine with his left hand.  He also has had acute cognitive changes.  He started having trouble dialing his daughter's phone number which is new.  MRI of brain without contrast performed on 08/15/2019 was personally reviewed  and showed remote large right hemispheric stroke and chronic small vessel ischemic changes, stable compared to prior MRI from 2015, no acute intracranial abnormality.   He underwent repeat neuropsychological testing on 11/29/2019 which demonstrated findings primarily consistent with major vascular neurocognitive disorder, relatively stable compared to prior testing in 2015.  Underlying neurodegenerative disorder could not be ruled out but clinically has been stable.    In February 2023, he became more confused.  Found to have UTI as well as acute infarct in left basal ganglia and corona radiata as well as severe distal left M1 MCA stenosis and moderate right ICA stenosis.  Discharged on ASA and Plavix for 3 months followed by Plavix monotherapy.  Following discharge, continued to be confused.  Mistakes wife with his ex-wife.  Incontinent.  Baseline hemineglect worse.     Prior medication:  Aricept (diarrhea, vivid dreams); galantamine (nausea)   3.  Headache: In February 2022, he reported new moderate non-throbbing headache back of head every night.  Ongoing for two months.  No neck pain.  It occurs in the evening while sitting talking or watching TV.  Treats with Tylenol almost everyday.  Lasts a half hour.  No change in medication just prior to onset.  Similar headache when he had his stroke.  MRI of brain without contrast on 08/14/2020 showed no new acute/subacute intracranial abnormality.  Headaches resolved.     01/30/20 Carotid doppler:  1-39% bilateral ICA stenosis. 10/16/13 LABS:  B12 720, TSH 1.668 10/19/13 MRI BRAIN WO:  stable remote large right MCA territory infarct.  Chronic microvascular ischemic changes. 05/21/13 LABS:  Hgb A1c 6 05/17/13 Carotid doppler: bilateral 1-39% stenosis of the right proximal ICA and no stenosis of the left ICA. 03/11/12 MRI Brain wo contrast:  Remote large right MCA infarct. Dementia with behavioral symptoms.  Etiology unclear.  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   HTN (hypertension) 07/19/2015   Hyperlipidemia    Major neurocognitive disorder 01/09/2014   Mild, related to stroke history   Nocturia    Renal insufficiency 06/25/2013   S/P carotid endarterectomy    BILATERAL ICA--  PATENT PER DUPLEX  05-19-2012   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     amantadine (SYMMETREL) 100 MG capsule Take 1 capsule (100 mg total) by mouth daily. (Patient taking differently: Take 100 mg by mouth daily. *Crushed*) 90 capsule 1   amLODipine (NORVASC) 10 MG tablet Take 1 tablet (10 mg total) by mouth every morning. (Patient taking differently: Take 10 mg by mouth every morning. *Crushed*) 90 tablet 0   carBAMazepine (TEGRETOL) 100 MG/5ML suspension Take 5 mLs (100 mg total) by mouth in the morning and in the afternoon, and then 10 mLs (200 mg total) at bedtime. 600 mL 3   Cholecalciferol (VITAMIN D3) 25 MCG (1000 UT) CHEW Chew 1 tablet by mouth daily.     clopidogrel (PLAVIX) 75 MG tablet Take 1 tablet (75 mg total) by mouth daily. (Patient taking differently: Take 75 mg  by mouth daily. *Crushed*) 90 tablet 1   clotrimazole-betamethasone (LOTRISONE) cream Apply 1 Application on to the skin daily. 30 g 0   ferrous sulfate 325 (65 FE) MG EC tablet Take 325 mg by mouth daily with breakfast. *Crushed*     fluticasone (CUTIVATE) 0.05 % cream 1 application  daily as needed (psoriasis).  3   hydrALAZINE (APRESOLINE) 25 MG tablet Take 1 tablet (25 mg total) by mouth every 8 (eight) hours. (Patient taking differently: Take 25 mg by mouth every 8 (eight) hours. *Crushed*) 270 tablet 1   hydrOXYzine (VISTARIL) 25 MG capsule Take 1 capsule by mouth everyday at 3:30 PM as needed 60 capsule 2   LORazepam (ATIVAN) 0.5 MG tablet Take 0.5 tablets (0.25 mg total) by mouth daily. *  Crushed* 10 tablet 0   LORazepam (ATIVAN) 0.5 MG tablet Take 1/2 tablet (0.25 mg total) by mouth in the morning and take 1 tablet (0.5 mg total) in the afternoon at 4pm. 50 tablet 4   LORazepam ER (LOREEV XR) 1 MG CS24 Take 1 mg by mouth at bedtime. *Crushed* (Patient taking differently: Take 1 mg by mouth at bedtime. *Crushed*) 20 capsule 0   LORazepam ER (LOREEV XR) 1 MG CS24 Take 1 capsule (1 mg total) by mouth at bedtime as directed. 30 capsule 4   LORazepam ER (LOREEV XR) 1 MG CS24 Take 1 mg by mouth at bedtime. *Crushed* 30 capsule 3   Multiple Vitamins-Minerals (ADULT ONE DAILY GUMMIES) CHEW Chew 1 tablet by mouth every morning.     pantoprazole (PROTONIX) 40 MG tablet Take 1 tablet (40 mg total) by mouth daily. (Patient taking differently: Take 40 mg by mouth daily. *Crushed*) 90 tablet 3   polyethylene glycol (MIRALAX / GLYCOLAX) 17 g packet Take 17 g by mouth daily. 14 each 0   tamsulosin (FLOMAX) 0.4 MG CAPS capsule Take 1 capsule (0.4 mg total) by mouth daily after supper. (Patient taking differently: Take 0.4 mg by mouth daily after supper. *Crushed*) 90 capsule 1   tiZANidine (ZANAFLEX) 4 MG tablet Take 1 tablet (4 mg total) by mouth every 6 (six) hours as needed for muscle spasms. 30 tablet 0    traZODone (DESYREL) 150 MG tablet Take 0.5 tablets (75 mg total) by mouth at bedtime. *Crushed* 20 tablet 0   triamcinolone cream (KENALOG) 0.1 % Apply 1 application topically 2 (two) times daily. (Patient taking differently: Apply 1 Application topically 2 (two) times daily as needed (rash).) 30 g 0   Current Facility-Administered Medications on File Prior to Visit  Medication Dose Route Frequency Provider Last Rate Last Admin   gemcitabine (GEMZAR) chemo syringe for bladder instillation 2,000 mg  2,000 mg Bladder Instillation Once Marcine Matar, MD        ALLERGIES: Allergies  Allergen Reactions   Bee Venom Anaphylaxis   Strawberry Extract Hives   Latex Itching   Zetia [Ezetimibe] Other (See Comments)    Intolerance    Adhesive [Tape] Other (See Comments)    blisters   Statins Other (See Comments)    myalgias    FAMILY HISTORY: Family History  Problem Relation Age of Onset   Heart disease Mother        CHF   Bipolar disorder Mother    Heart disease Father        CHF      Objective:  Blood pressure 122/60, pulse 60, height 5\' 9"  (1.753 m), weight 170 lb (77.1 kg), SpO2 98 %. General: No acute distress.  Patient appears frail  Head:  Normocephalic/atraumatic Eyes:  Fundi examined but not visualized Neck: supple, no paraspinal tenderness, full range of motion Heart:  Regular rate and rhythm Neurological Exam:   Hypomimia.  Limited tracking with eye movements. Increased tone, particularly left upper extremity., muscle strength 3/5 left upper extremity, otherwise 4/5 throughout.  Resting tremor in hands..  Sensation to light touch intact.  Deep tendon reflexes 3+ throughout. Requires assistance to stand.  Difficulty initiating step and then shuffling.  Reduced left arm swing.     Shon Millet, DO  CC: Seabron Spates, DO

## 2022-11-09 ENCOUNTER — Ambulatory Visit: Payer: PPO | Admitting: Neurology

## 2022-11-09 ENCOUNTER — Encounter: Payer: Self-pay | Admitting: Neurology

## 2022-11-09 ENCOUNTER — Ambulatory Visit: Payer: PPO | Admitting: Physical Therapy

## 2022-11-09 ENCOUNTER — Other Ambulatory Visit (HOSPITAL_BASED_OUTPATIENT_CLINIC_OR_DEPARTMENT_OTHER): Payer: Self-pay

## 2022-11-09 VITALS — BP 122/60 | HR 60 | Ht 69.0 in | Wt 170.0 lb

## 2022-11-09 DIAGNOSIS — G20A1 Parkinson's disease without dyskinesia, without mention of fluctuations: Secondary | ICD-10-CM

## 2022-11-09 MED ORDER — CARBIDOPA-LEVODOPA 25-100 MG PO TABS
ORAL_TABLET | ORAL | 5 refills | Status: DC
  Filled 2022-11-09: qty 90, 30d supply, fill #0
  Filled 2022-12-15: qty 90, 30d supply, fill #1
  Filled 2023-01-02 – 2023-01-03 (×2): qty 90, 30d supply, fill #2
  Filled 2023-02-06: qty 90, 30d supply, fill #3

## 2022-11-09 NOTE — Patient Instructions (Addendum)
1. Start carbidopa (Sinemet) 25/100mg  tablets.  Week 1: Take 0.5 tablet in morning and 0.5 tablet in evening/bedtime.  Week 2: Take 0.5 tablet in morning, 0.5 tablet in afternoon, and 0.5 tablet in evening/bedtime.  Week 3 & thereafter: Take 1 tablet three times daily.   Take the medication at the same time everyday. The medication does not get absorbed into your body as well, if you take it with protein-containing foods (meat, dairy, beans). Try taking the medication about one hour before meals. If you experience nausea by taking it on an empty stomach, you may take it with carbohydrate-containing food,such as bread or crackers. Side effects to look out for, include dizziness, nausea, vivid dreams and hallucinations. If you experience any of these symptoms, please call us.  2. Refer to neuro-rehab at [facility]. 3.  Follow up 4 months.

## 2022-11-10 ENCOUNTER — Telehealth: Payer: Self-pay | Admitting: Family Medicine

## 2022-11-10 NOTE — Telephone Encounter (Signed)
Pt's spouse dropped off document to be filled out by provider (Yellow folder- [employer] Medical examination Report) Pt would like to have document faxed when ready to [phone] and please call spouse to pick up a copy of document. Pt tel [phone]. Document put at front office tray under providers name.

## 2022-11-11 ENCOUNTER — Ambulatory Visit: Payer: PPO | Admitting: Physical Therapy

## 2022-11-11 ENCOUNTER — Encounter: Payer: Self-pay | Admitting: Physical Therapy

## 2022-11-11 DIAGNOSIS — M6281 Muscle weakness (generalized): Secondary | ICD-10-CM

## 2022-11-11 DIAGNOSIS — Z0279 Encounter for issue of other medical certificate: Secondary | ICD-10-CM

## 2022-11-11 DIAGNOSIS — R296 Repeated falls: Secondary | ICD-10-CM

## 2022-11-11 DIAGNOSIS — R278 Other lack of coordination: Secondary | ICD-10-CM

## 2022-11-11 DIAGNOSIS — I69354 Hemiplegia and hemiparesis following cerebral infarction affecting left non-dominant side: Secondary | ICD-10-CM

## 2022-11-11 DIAGNOSIS — R262 Difficulty in walking, not elsewhere classified: Secondary | ICD-10-CM

## 2022-11-11 NOTE — Telephone Encounter (Signed)
Received. Placed in folder 

## 2022-11-11 NOTE — Therapy (Signed)
OUTPATIENT PHYSICAL THERAPY NEURO Treatment   Patient Name: William [name] MRN: [id number] DOB:[birth year], 82 y.o., male Today's Date: 11/11/2022  PCP: Donato Schultz  DO REFERRING PROVIDER: same  END OF SESSION:  PT End of Session - 11/11/22 1440     Visit Number 35    Date for PT Re-Evaluation 12/03/22    Authorization Type HTA    PT Start Time 1440    PT Stop Time 1528    PT Time Calculation (min) 48 min    Equipment Utilized During Treatment Gait belt    Activity Tolerance Patient tolerated treatment well    Behavior During Therapy WFL for tasks assessed/performed                 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   HTN (hypertension) 07/19/2015   Hyperlipidemia    Major neurocognitive disorder 01/09/2014   Mild, related to stroke history   Nocturia    Renal insufficiency 06/25/2013   S/P carotid endarterectomy    BILATERAL ICA--  PATENT PER DUPLEX  05-19-2012   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/26/2017   Right calf   Urinary frequency    Vitamin D deficiency    Past Surgical History:  Procedure Laterality Date   APPENDECTOMY  AS CHILD   CARDIOVASCULAR STRESS TEST  03-27-2012  DR CRENSHAW   LOW RISK LEXISCAN STUDY-- PROBABLE NORMAL PERFUSION AND SOFT TISSUE ATTENUATION/  NO ISCHEMIA/ EF 51%   CAROTID ENDARTERECTOMY Bilateral LEFT  11-12-2008  DR GREG HAYES   RIGHT ICA  2006  ([facility])   CHOLECYSTECTOMY N/A 02/23/2022   Procedure: LAPAROSCOPIC CHOLECYSTECTOMY;  Surgeon: Quentin Ore, MD;  Location: [facility];  Service:  General;  Laterality: N/A;   CYSTOSCOPY W/ RETROGRADES Bilateral 06/22/2021   Procedure: CYSTOSCOPY WITH RETROGRADE PYELOGRAM;  Surgeon: Marcine Matar, MD;  Location: [facility];  Service: Urology;  Laterality: Bilateral;   CYSTOSCOPY WITH LITHOLAPAXY N/A 02/26/2013   Procedure: CYSTOSCOPY WITH LITHOLAPAXY;  Surgeon: Marcine Matar, MD;  Location: [facility];  Service: Urology;  Laterality: N/A;   ENDOSCOPIC RETROGRADE CHOLANGIOPANCREATOGRAPHY (ERCP) WITH PROPOFOL N/A 02/22/2022   Procedure: ENDOSCOPIC RETROGRADE CHOLANGIOPANCREATOGRAPHY (ERCP) WITH PROPOFOL;  Surgeon: Jeani Hawking, MD;  Location: [facility];  Service: Gastroenterology;  Laterality: N/A;   EYE SURGERY  Jan. 2016   cataract surgery both eyes   INGUINAL HERNIA REPAIR Right 11-08-2006   IR KYPHO EA ADDL LEVEL THORACIC OR LUMBAR  02/12/2021   IR RADIOLOGIST EVAL & MGMT  02/18/2021   MASS EXCISION N/A 03/03/2016   Procedure: EXCISION OF BACK  MASS;  Surgeon: Almond Lint, MD;  Location: [facility];  Service: General;  Laterality: N/A;   MOHS SURGERY Left 1/ 2016   Dr Margo Aye-- Basal cell   PROSTATE SURGERY     REMOVAL OF STONES  02/22/2022   Procedure: REMOVAL OF STONES;  Surgeon: Jeani Hawking, MD;  Location: [facility];  Service: Gastroenterology;;   Dennison Mascot  02/22/2022  Procedure: SPHINCTEROTOMY;  Surgeon: Jeani Hawking, MD;  Location: [facility] ENDOSCOPY;  Service: Gastroenterology;;   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-C N/A 06/22/2021   Procedure: TRANSURETHRAL RESECTION OF BLADDER TUMOR;  Surgeon: Marcine Matar, MD;  Location: [facility];  Service: Urology;  Laterality: N/A;   TRANSURETHRAL RESECTION OF PROSTATE N/A 02/26/2013   Procedure: TRANSURETHRAL RESECTION OF THE PROSTATE WITH GYRUS INSTRUMENTS;  Surgeon: Marcine Matar, MD;  Location: [facility];  Service: Urology;  Laterality: N/A;   TRANSURETHRAL RESECTION OF  PROSTATE N/A 06/22/2021   Procedure: TRANSURETHRAL RESECTION OF THE PROSTATE (TURP);  Surgeon: Marcine Matar, MD;  Location: [facility];  Service: Urology;  Laterality: N/A;   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   Insomnia    Prediabetes    Acute 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 (HCC)    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   Transaminitis 07/27/2021   UTI (urinary tract infection) 07/27/2021   CVA (cerebral vascular accident) (HCC) 07/27/2021   Fall 07/27/2021   Hyperglycemia 07/27/2021   Cholelithiasis 07/27/2021   Hypoxia 07/27/2021   Nausea and vomiting 07/27/2021   Acute metabolic encephalopathy 07/27/2021   Normocytic anemia 07/27/2021   Chronic back pain 07/27/2021   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   Closed fracture of first lumbar vertebra with routine healing 02/03/2021   Closed fracture of multiple ribs 11/18/2020   Anxiety 01/29/2020   Leg pain, bilateral 01/29/2020   Ingrown toenail 07/13/2019   Lumbar spondylosis 05/02/2018   Pain in left knee 03/09/2018   Osteoarthritis of left hip 01/16/2018   Trochanteric bursitis of left hip 01/16/2018   Preventative health care 09/26/2017   HTN (hypertension) 07/19/2015   Hyperlipidemia 07/19/2015   Great toe pain 02/11/2014   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   Obesity (BMI 30-39.9) 06/25/2013   Renal insufficiency 06/25/2013   Weakness of left arm 06/25/2013   Sebaceous cyst 03/03/2011   Sprain of lumbar region 07/31/2010   Rib pain, left 08/29/2009   Carotid artery stenosis, asymptomatic, bilateral 05/02/2009   Eczema, atopic 05/31/2008   Vitamin D deficiency 03/01/2008   BPH (benign prostatic hyperplasia) 08/06/2007   Fasting hyperglycemia 12/21/2006   History of right MCA infarct 2006    ONSET DATE: 06/29/22  REFERRING DIAG:  Diagnosis  I63.9 (ICD-10-CM) - Cerebrovascular accident (CVA), unspecified mechanism (HCC)  W19.XXXD (ICD-10-CM) - Fall, subsequent encounter    THERAPY DIAG:  Muscle weakness (generalized)  Hemiplegia and hemiparesis following cerebral infarction affecting left non-dominant side (HCC)  Other lack of coordination  Difficulty in walking, not elsewhere classified  Repeated falls  Rationale for Evaluation and Treatment: Rehabilitation  SUBJECTIVE:  SUBJECTIVE STATEMENT:  Wife reports that they saw Dr. Everlena Cooper, they changed his medication and today is the first day on it.  She reports that he did a really good job walking into the office, Dr. Everlena Cooper would like him to see Parkinson's specialist Amy Bernie Covey, PT at [facility], I called them and asked them to be on the look out for the referral and when they can get him in let me know PERTINENT HISTORY:  EUGENE SHADOWENS is a 82 year old man with dementia, CKD, HTN, CAD, HLD and history of CVA  (2006, 2021), Parkinson's  PAIN:  Are you having pain? No  PRECAUTIONS: Fall  WEIGHT BEARING RESTRICTIONS: No  FALLS: Has patient fallen in last 6 months? No  LIVING ENVIRONMENT: Lives with: lives with their family and lives with their spouse Lives in:  House/apartment Stairs:  1 brick high step Has following equipment at home: Environmental consultant - 2 wheeled, Wheelchair (manual), Shower bench, bed side commode, Grab bars, and hospital bed, sliding pad for car  PLOF: Independent with basic ADLs and Independent with household mobility without device  PATIENT GOALS: Walk around the block with his wife. Be able to use the dining room chair rather than have to use the W/C. Increased I with bed mobility and simple tasks at home like make some coffee or brush his teeth, Step over tub to shower, has bench, but he can't really use it. Up and down the one step to get to back deck.  OBJECTIVE:   DIAGNOSTIC FINDINGS:  MRI 2021 with degenerative changes in lumbar spine, L3-4 R subarticular stenosis  COGNITION: Overall cognitive status: History of cognitive impairments - at baseline   SENSATION: Not tested  COORDINATION: Moderately impaired BLE   MUSCLE LENGTH: Hamstrings: severely restricted B Thomas test: Severely restricted B.  POSTURE: rounded shoulders, forward head, increased thoracic kyphosis, posterior pelvic tilt, and flexed trunk   LOWER EXTREMITY ROM:   BLE extremely stiff throughout, limited hip ROM in all planes, B knees limited in extension.   LOWER EXTREMITY MMT:  3+/5 throughout. Unable to determine accuracy of MMT due to cognition. Functionally demosntrates poor coordinated activation and poor muscular endurance.   BED MOBILITY: Min A for Supine<> Sit. Patient was using a ladder in his hospital bed and could move MI.  TRANSFERS: Min A, mod VC, tends to lean back.  11/02/22 independent with use of hands on arm chair with a couple of tries  CURB: Min A-Has one step out to his deck.  STAIRS: N/A   GAIT: Gait pattern: step to pattern, decreased arm swing- Right, decreased arm swing- Left, decreased step length- Right, decreased step length- Left, shuffling, festinating, trunk flexed, narrow BOS, poor foot clearance- Right, and poor  foot clearance- Left Distance walked: 7' Assistive device utilized: Environmental consultant - 2 wheeled Level of assistance: Occasional min A Comments: mod VC to stay close to walker, take longer steps.  FUNCTIONAL TESTS:  5 times sit to stand: 38.23  09/29/22  could not performs 5XSTS, 11/02/22 = 47 seconds with cues to use arms Timed up and go (TUG): 47.91  with RW, 07/21/22 TUG 29  seconds without device, 09/29/22 25 seconds, 11/02/22 TUG 20 seconds  TODAY'S TREATMENT:  DATE:  11/10/32 Nustep level 4 x 5 minutes Gait outside around half parking [place] wihtout rest until back at the building, then rest and walk int he grass along the side of the building and back into the building with him pushing the open door buttons, gait belt and mostly HHA, cues for step length, some cues to count when going through doors Folding towels and then carry them and stack them in closet with SBA/CGA for this Standing volleyball with SBA HHA direction changes Gait to the car HHA, car transfer  11/04/22 UBE level 4 x 6 mintues Gait outside light HHA, some cues for bigger steps Supine bridges HS stretches Trunk rotation in supine Bed mobility scooting to one side and then back Volley ball Ball kicks Hip abduction Gait outside again, curbs slopes and then toe touches on the curb alternating  11/02/22 Nustep level 5 x 6 minutes Gait outside with gait belt and HHA, cues verbal and tactile for speed and step length Ball tapping sitting for core and standing for balance Func reaching and carrying for balance Navigating on and over obstalces cg-min with cuing to get closer ot object before he steps- he tends to step from too far away from object STS working on leg strength holding ball 10x Step taps 6 inc 10 x each leg then alt increased LOB Gait outside HHA/CGA better steps today   10/28/22 Amb  outside with gait belt and HHA with gentle pull to work on increased cadence and step thru- stressing with shuffled steps and leaning fwd Walking in clinic looking for cones  and working on scanning, turns and navigating obstacles  [place] tap standing and sitting balloon taps Obstacle course navigation over and around- min A with cuing for safety to get closer before stepping on or over HS curl 2 sets 10 20# Knee ext 2 sets 10 10# Alt taps 6 inch 20 x 2 sets CGA Ball toss working on 2 hand catch STS with wt ball press working on 2 hands slightly elevated mat  10/25/21 Nustep level 5 x 6 mintues Folding towels from low surface and then stacking towels up on a high surface at his side so he was having to turn, then we carried it and stacked it on a shelf 2.5# LAQ's 2.5# marches 2.5# hip abduction standing Green tband ankle PF/DF Green tband HS curls Standing with HHA cone toe touches Gait HHA with slight pull to get faster and bigger steps Volleyball in standing  10/21/22 Gait outside 1/2 parking [place] Folding towels from low surface to high surface with turn while standing, carry towels and stack Volley ball sitting and standing Ball kicks On mat table bridges, rolls, green tband clamshells, partial sit ups Got him in quadraped raise one arm and then leg, difficulty getting gout of this position Step and turn and touch in the pobars Gait again out to car  10/19/22 Walk outside half way around parking [place] and a rest, then around our building back through the front door,  Volley ball sitting and standing, ball kicks sitting and standing In pbars marching and hip abduction 12" toe clears Tband on floor working on longer steps Sit to stand elevated surface cues needed Stairs step over step up and down Gait outside again cues and CGA  10/13/22 Walk outside with wife and patient, had wife do the HHA and the CGA, gave her instruction in safety and verbal cues as well as tactile cues HS  curls 20# 3x10 Leg extension  5# 3x10 Leg press 20# 2x10 5# single arm pulls 4" step to his left working on picking leg up and clearing Gait again 4 x 100 feet with me more in front of him and him trying to put his feet where I am, this seemed to help with the step length  PATIENT EDUCATION: Education details: POC Person educated: Patient and Spouse Education method: Explanation Education comprehension: verbalized understanding  HOME EXERCISE PROGRAM: TBD  GOALS: Goals reviewed with patient? Yes  SHORT TERM GOALS: Target date: 07/19/22  I with initial HEP Baseline: Goal status: met 11/11/22  2. Decrease 5 x STS to < 25 sec to demonstrate improved functional strength  Baseline: 38 sec  Goal status: 08/09/22 14 sec-met  3. Decrease TUG to < 35 sec to demonstrate improved functional balance  Baseline: 47 sec  Goal status: 20 seconds 11/02/22  4. Perform sit <> stand transfers from average chair height with S and VC to be able to use the swivel chair at his dining table.  Baseline: Min A  Goal status: 09/16/22-Inconsistently performs  with S from average height. At times requires CGA to cue forward weight shift. ongoing  LONG TERM GOALS: Target date: 09/29/22  Patient will be able to step over his tub at home using grab bars for stability with CGA to access his shower. Baseline: Unable- has bench, but wife reports he steps over then sits. Goal status: 09/16/22-met  2.  Decrease 5 x STS to < 18 sec to demonstrate improved functional strength Baseline:  Goal status: not met 62 seconds and able to do only 3 09/29/22  3.  Decrease TUG to < 25 sec to demonstrate improved functional balance Baseline:  Goal status: 11/02/22 20 seconds  4.  Patient will perform his bed mobility with MI. Baseline: Min A Goal status: on hard surface mat he is independent but on soft bed at home he struggles, wife reports only needs a hand to get up now 11/02/22  5.  Patient will ambulate at least 400' with  LRAD, CGA, demonstrate improved upright posture, longer step length. Baseline:  Goal status: 09/16/22-Amb >400', but began to fatigue after about 300' with more flexed posture, L foot shuffling more, unable to maintain safe technique. ongoing  6.  Patient will manage up and down a single step with LRAD, CGA to access his deck at home. Baseline: Unable Goal status: 09/16/22-Up and down 1 step with U rail. met  ASSESSMENT:  CLINICAL IMPRESSION: Wife reports they saw Dr. Everlena Cooper, they will get a referral to see a parkinson's specialist soon, we talked about how we can help coordinate transfer of care, they also report a change in medication.  He was having a good day to day and much of the walk and tasks was SBA only.   did speak with his wife about the adult day care and what she wants and is looking for.   OBJECTIVE IMPAIRMENTS: Abnormal gait, decreased activity tolerance, decreased balance, decreased cognition, decreased coordination, decreased endurance, decreased mobility, difficulty walking, decreased ROM, decreased strength, decreased safety awareness, impaired flexibility, impaired UE functional use, improper body mechanics, and postural dysfunction.   ACTIVITY LIMITATIONS: carrying, lifting, bending, sitting, standing, squatting, stairs, transfers, bed mobility, bathing, toileting, dressing, hygiene/grooming, and locomotion level  PARTICIPATION LIMITATIONS: meal prep, cleaning, laundry, interpersonal relationship, shopping, and community activity  PERSONAL FACTORS: Age, Past/current experiences, and 3+ comorbidities: CVA, Parkinson's traits, dementia  are also affecting patient's functional outcome.   REHAB POTENTIAL: Good  CLINICAL  DECISION MAKING: Evolving/moderate complexity  EVALUATION COMPLEXITY: Moderate  PLAN:  PT FREQUENCY: 2x/week  PT DURATION: 12 weeks  PLANNED INTERVENTIONS: Therapeutic exercises, Therapeutic activity, Neuromuscular re-education, Balance training, Gait  training, Patient/Family education, Self Care, Joint mobilization, Stair training, Moist heat, and Manual therapy  PLAN FOR NEXT SESSION:Continue to work on his balance and function  , independence, safety and overall quality of life  work to transfer care when the order comes through Encounter Date: 11/11/2022   Jearld Lesch, PT 11/11/2022, 3:27 PM  [facility] [address] [facility] [place], [place], [address] Phone: [phone]   Fax:  [phone]

## 2022-11-14 ENCOUNTER — Other Ambulatory Visit: Payer: Self-pay | Admitting: Family Medicine

## 2022-11-14 DIAGNOSIS — L231 Allergic contact dermatitis due to adhesives: Secondary | ICD-10-CM

## 2022-11-15 ENCOUNTER — Ambulatory Visit: Payer: PPO | Attending: Family Medicine | Admitting: Physical Therapy

## 2022-11-15 ENCOUNTER — Encounter: Payer: Self-pay | Admitting: Physical Therapy

## 2022-11-15 ENCOUNTER — Other Ambulatory Visit (HOSPITAL_BASED_OUTPATIENT_CLINIC_OR_DEPARTMENT_OTHER): Payer: Self-pay

## 2022-11-15 DIAGNOSIS — I69318 Other symptoms and signs involving cognitive functions following cerebral infarction: Secondary | ICD-10-CM | POA: Insufficient documentation

## 2022-11-15 DIAGNOSIS — R2689 Other abnormalities of gait and mobility: Secondary | ICD-10-CM | POA: Diagnosis not present

## 2022-11-15 DIAGNOSIS — M6281 Muscle weakness (generalized): Secondary | ICD-10-CM | POA: Insufficient documentation

## 2022-11-15 DIAGNOSIS — I69354 Hemiplegia and hemiparesis following cerebral infarction affecting left non-dominant side: Secondary | ICD-10-CM | POA: Insufficient documentation

## 2022-11-15 DIAGNOSIS — R41840 Attention and concentration deficit: Secondary | ICD-10-CM | POA: Diagnosis not present

## 2022-11-15 DIAGNOSIS — R296 Repeated falls: Secondary | ICD-10-CM | POA: Insufficient documentation

## 2022-11-15 DIAGNOSIS — R262 Difficulty in walking, not elsewhere classified: Secondary | ICD-10-CM | POA: Insufficient documentation

## 2022-11-15 DIAGNOSIS — R278 Other lack of coordination: Secondary | ICD-10-CM | POA: Diagnosis not present

## 2022-11-15 MED ORDER — PANTOPRAZOLE SODIUM 40 MG PO TBEC
40.0000 mg | DELAYED_RELEASE_TABLET | Freq: Every day | ORAL | 3 refills | Status: DC
  Filled 2022-11-15: qty 90, 90d supply, fill #0
  Filled 2023-03-03: qty 90, 90d supply, fill #1
  Filled 2023-05-29: qty 90, 90d supply, fill #2
  Filled 2023-08-22: qty 90, 90d supply, fill #3

## 2022-11-15 MED ORDER — TRIAMCINOLONE ACETONIDE 0.1 % EX CREA
1.0000 | TOPICAL_CREAM | Freq: Two times a day (BID) | CUTANEOUS | 0 refills | Status: DC
  Filled 2022-11-15: qty 30, 15d supply, fill #0

## 2022-11-15 NOTE — Therapy (Signed)
OUTPATIENT PHYSICAL THERAPY NEURO Treatment   Patient Name: William [name] MRN: [id number] DOB:11/09/1940, 82 y.o., male Today's Date: 11/15/2022  PCP: Donato Schultz  DO REFERRING PROVIDER: same  END OF SESSION:  PT End of Session - 11/15/22 1450     Visit Number [id number]    Date for PT Re-Evaluation 12/03/22    Authorization Type HTA    PT Start Time 1445    PT Stop Time 1528    PT Time Calculation (min) 43 min    Equipment Utilized During Treatment Gait belt    Activity Tolerance Patient tolerated treatment well    Behavior During Therapy WFL for tasks assessed/performed                 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   HTN (hypertension) 07/19/2015   Hyperlipidemia    Major neurocognitive disorder 01/09/2014   Mild, related to stroke history   Nocturia    Renal insufficiency 06/25/2013   S/P carotid endarterectomy    BILATERAL ICA--  PATENT PER DUPLEX  05-19-2012   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/26/2017   Right calf   Urinary frequency    Vitamin D deficiency    Past Surgical History:  Procedure Laterality Date   APPENDECTOMY  AS CHILD   CARDIOVASCULAR STRESS TEST  03-27-2012  DR CRENSHAW   LOW RISK LEXISCAN STUDY-- PROBABLE NORMAL PERFUSION AND SOFT TISSUE ATTENUATION/  NO ISCHEMIA/ EF 51%   CAROTID ENDARTERECTOMY Bilateral LEFT  11-12-2008  DR GREG HAYES   RIGHT ICA  2006  ([facility])   CHOLECYSTECTOMY N/A 02/23/2022   Procedure: LAPAROSCOPIC CHOLECYSTECTOMY;  Surgeon: Quentin Ore, MD;  Location: [facility];  Service:  General;  Laterality: N/A;   CYSTOSCOPY W/ RETROGRADES Bilateral 06/22/2021   Procedure: CYSTOSCOPY WITH RETROGRADE PYELOGRAM;  Surgeon: Marcine Matar, MD;  Location: [facility];  Service: Urology;  Laterality: Bilateral;   CYSTOSCOPY WITH LITHOLAPAXY N/A 02/26/2013   Procedure: CYSTOSCOPY WITH LITHOLAPAXY;  Surgeon: Marcine Matar, MD;  Location: [facility];  Service: Urology;  Laterality: N/A;   ENDOSCOPIC RETROGRADE CHOLANGIOPANCREATOGRAPHY (ERCP) WITH PROPOFOL N/A 02/22/2022   Procedure: ENDOSCOPIC RETROGRADE CHOLANGIOPANCREATOGRAPHY (ERCP) WITH PROPOFOL;  Surgeon: Jeani Hawking, MD;  Location: [facility];  Service: Gastroenterology;  Laterality: N/A;   EYE SURGERY  Jan. 2016   cataract surgery both eyes   INGUINAL HERNIA REPAIR Right 11-08-2006   IR KYPHO EA ADDL LEVEL THORACIC OR LUMBAR  02/12/2021   IR RADIOLOGIST EVAL & MGMT  02/18/2021   MASS EXCISION N/A 03/03/2016   Procedure: EXCISION OF BACK  MASS;  Surgeon: Almond Lint, MD;  Location: [facility];  Service: General;  Laterality: N/A;   MOHS SURGERY Left 1/ 2016   Dr Margo Aye-- Basal cell   PROSTATE SURGERY     REMOVAL OF STONES  02/22/2022   Procedure: REMOVAL OF STONES;  Surgeon: Jeani Hawking, MD;  Location: [facility];  Service: Gastroenterology;;   Dennison Mascot  02/22/2022  Procedure: SPHINCTEROTOMY;  Surgeon: Jeani Hawking, MD;  Location: [facility];  Service: Gastroenterology;;   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-C N/A 06/22/2021   Procedure: TRANSURETHRAL RESECTION OF BLADDER TUMOR;  Surgeon: Marcine Matar, MD;  Location: [facility];  Service: Urology;  Laterality: N/A;   TRANSURETHRAL RESECTION OF PROSTATE N/A 02/26/2013   Procedure: TRANSURETHRAL RESECTION OF THE PROSTATE WITH GYRUS INSTRUMENTS;  Surgeon: Marcine Matar, MD;  Location: [facility];  Service: Urology;  Laterality: N/A;   TRANSURETHRAL RESECTION OF  PROSTATE N/A 06/22/2021   Procedure: TRANSURETHRAL RESECTION OF THE PROSTATE (TURP);  Surgeon: Marcine Matar, MD;  Location: [facility];  Service: Urology;  Laterality: N/A;   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   Insomnia    Prediabetes    Acute 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 (HCC)    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   Transaminitis 07/27/2021   UTI (urinary tract infection) 07/27/2021   CVA (cerebral vascular accident) (HCC) 07/27/2021   Fall 07/27/2021   Hyperglycemia 07/27/2021   Cholelithiasis 07/27/2021   Hypoxia 07/27/2021   Nausea and vomiting 07/27/2021   Acute metabolic encephalopathy 07/27/2021   Normocytic anemia 07/27/2021   Chronic back pain 07/27/2021   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   Closed fracture of first lumbar vertebra with routine healing 02/03/2021   Closed fracture of multiple ribs 11/18/2020   Anxiety 01/29/2020   Leg pain, bilateral 01/29/2020   Ingrown toenail 07/13/2019   Lumbar spondylosis 05/02/2018   Pain in left knee 03/09/2018   Osteoarthritis of left hip 01/16/2018   Trochanteric bursitis of left hip 01/16/2018   Preventative health care 09/26/2017   HTN (hypertension) 07/19/2015   Hyperlipidemia 07/19/2015   Great toe pain 02/11/2014   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   Obesity (BMI 30-39.9) 06/25/2013   Renal insufficiency 06/25/2013   Weakness of left arm 06/25/2013   Sebaceous cyst 03/03/2011   Sprain of lumbar region 07/31/2010   Rib pain, left 08/29/2009   Carotid artery stenosis, asymptomatic, bilateral 05/02/2009   Eczema, atopic 05/31/2008   Vitamin D deficiency 03/01/2008   BPH (benign prostatic hyperplasia) 08/06/2007   Fasting hyperglycemia 12/21/2006   History of right MCA infarct 2006    ONSET DATE: 06/29/22  REFERRING DIAG:  Diagnosis  I63.9 (ICD-10-CM) - Cerebrovascular accident (CVA), unspecified mechanism (HCC)  W19.XXXD (ICD-10-CM) - Fall, subsequent encounter    THERAPY DIAG:  Muscle weakness (generalized)  Hemiplegia and hemiparesis following cerebral infarction affecting left non-dominant side (HCC)  Difficulty in walking, not elsewhere classified  Repeated falls  Rationale for Evaluation and Treatment: Rehabilitation  SUBJECTIVE:  SUBJECTIVE STATEMENT: Patients wife reports that she has not heard back from [facility], I resent email regarding scheduling with them for the [facility] specialists.  She denies falls, reports that she has been doing some walking with him to the mailbox PERTINENT HISTORY:  KEANDRE POYER is a 82 year old man with dementia, CKD, HTN, CAD, HLD and history of CVA  (2006, 2021), Parkinson's  PAIN:  Are you having pain? No  PRECAUTIONS: Fall  WEIGHT BEARING RESTRICTIONS: No  FALLS: Has patient fallen in last 6 months? No  LIVING ENVIRONMENT: Lives with: lives with their family and lives with their spouse Lives in: House/apartment Stairs:  1 brick high step Has following equipment at home: Environmental consultant - 2 wheeled, Wheelchair (manual), Shower bench, bed side commode, Grab bars, and hospital bed,  sliding pad for car  PLOF: Independent with basic ADLs and Independent with household mobility without device  PATIENT GOALS: Walk around the block with his wife. Be able to use the dining room chair rather than have to use the W/C. Increased I with bed mobility and simple tasks at home like make some coffee or brush his teeth, Step over tub to shower, has bench, but he can't really use it. Up and down the one step to get to back deck.  OBJECTIVE:   DIAGNOSTIC FINDINGS:  MRI 2021 with degenerative changes in lumbar spine, L3-4 R subarticular stenosis  COGNITION: Overall cognitive status: History of cognitive impairments - at baseline   SENSATION: Not tested  COORDINATION: Moderately impaired BLE   MUSCLE LENGTH: Hamstrings: severely restricted B Thomas test: Severely restricted B.  POSTURE: rounded shoulders, forward head, increased thoracic kyphosis, posterior pelvic tilt, and flexed trunk   LOWER EXTREMITY ROM:   BLE extremely stiff throughout, limited hip ROM in all planes, B knees limited in extension.   LOWER EXTREMITY MMT:  3+/5 throughout. Unable to determine accuracy of MMT due to cognition. Functionally demosntrates poor coordinated activation and poor muscular endurance.   BED MOBILITY: Min A for Supine<> Sit. Patient was using a ladder in his hospital bed and could move MI.  TRANSFERS: Min A, mod VC, tends to lean back.  11/02/22 independent with use of hands on arm chair with a couple of tries  CURB: Min A-Has one step out to his deck.  STAIRS: N/A   GAIT: Gait pattern: step to pattern, decreased arm swing- Right, decreased arm swing- Left, decreased step length- Right, decreased step length- Left, shuffling, festinating, trunk flexed, narrow BOS, poor foot clearance- Right, and poor foot clearance- Left Distance walked: 54' Assistive device utilized: Environmental consultant - 2 wheeled Level of assistance: Occasional min A Comments: mod VC to stay close to walker, take longer  steps.  FUNCTIONAL TESTS:  5 times sit to stand: 38.23  09/29/22  could not performs 5XSTS, 11/02/22 = 47 seconds with cues to use arms Timed up and go (TUG): 47.91  with RW, 07/21/22 TUG 29  seconds without device, 09/29/22 25 seconds, 11/02/22 TUG 20 seconds  TODAY'S TREATMENT:  DATE:  11/15/22 Gait outside 1/2 parking [place] and then rest, HHA, then up and back around the building, when he fatigues he starts to shuffle and needs cues to stop and rest and then big steps, at times needs to count with going through doors In Pbars step and reach Step toward cone color that PT calls out [place] toss and ball kicks Side stepping Standing hip marches and hip abduction Standing on airex 5# pulleys with left arm and then with right arm On airex reaching  11/10/32 Nustep level 4 x 5 minutes Gait outside around half parking [place] wihtout rest until back at the building, then rest and walk int he grass along the side of the building and back into the building with him pushing the open door buttons, gait belt and mostly HHA, cues for step length, some cues to count when going through doors Folding towels and then carry them and stack them in closet with SBA/CGA for this Standing volleyball with SBA HHA direction changes Gait to the car HHA, car transfer  11/04/22 UBE level 4 x 6 mintues Gait outside light HHA, some cues for bigger steps Supine bridges HS stretches Trunk rotation in supine Bed mobility scooting to one side and then back Volley ball Ball kicks Hip abduction Gait outside again, curbs slopes and then toe touches on the curb alternating  11/02/22 Nustep level 5 x 6 minutes Gait outside with gait belt and HHA, cues verbal and tactile for speed and step length Ball tapping sitting for core and standing for balance Func reaching and carrying for  balance Navigating on and over obstalces cg-min with cuing to get closer ot object before he steps- he tends to step from too far away from object STS working on leg strength holding ball 10x Step taps 6 inc 10 x each leg then alt increased LOB Gait outside HHA/CGA better steps today   10/28/22 Amb outside with gait belt and HHA with gentle pull to work on increased cadence and step thru- stressing with shuffled steps and leaning fwd Walking in clinic looking for cones  and working on scanning, turns and navigating obstacles  [place] tap standing and sitting balloon taps Obstacle course navigation over and around- min A with cuing for safety to get closer before stepping on or over HS curl 2 sets 10 20# Knee ext 2 sets 10 10# Alt taps 6 inch 20 x 2 sets CGA Ball toss working on 2 hand catch STS with wt ball press working on 2 hands slightly elevated mat  10/25/21 Nustep level 5 x 6 mintues Folding towels from low surface and then stacking towels up on a high surface at his side so he was having to turn, then we carried it and stacked it on a shelf 2.5# LAQ's 2.5# marches 2.5# hip abduction standing Green tband ankle PF/DF Green tband HS curls Standing with HHA cone toe touches Gait HHA with slight pull to get faster and bigger steps Volleyball in standing  10/21/22 Gait outside 1/2 parking [place] Folding towels from low surface to high surface with turn while standing, carry towels and stack Volley ball sitting and standing Ball kicks On mat table bridges, rolls, green tband clamshells, partial sit ups Got him in quadraped raise one arm and then leg, difficulty getting gout of this position Step and turn and touch in the pobars Gait again out to car  10/19/22 Walk outside half way around parking [place] and a rest, then around our building back  through the front door,  Volley ball sitting and standing, ball kicks sitting and standing In pbars marching and hip abduction 12" toe  clears Tband on floor working on longer steps Sit to stand elevated surface cues needed Stairs step over step up and down Gait outside again cues and CGA  10/13/22 Walk outside with wife and patient, had wife do the HHA and the CGA, gave her instruction in safety and verbal cues as well as tactile cues HS curls 20# 3x10 Leg extension 5# 3x10 Leg press 20# 2x10 5# single arm pulls 4" step to his left working on picking leg up and clearing Gait again 4 x 100 feet with me more in front of him and him trying to put his feet where I am, this seemed to help with the step length  PATIENT EDUCATION: Education details: POC Person educated: Patient and Spouse Education method: Explanation Education comprehension: verbalized understanding  HOME EXERCISE PROGRAM: TBD  GOALS: Goals reviewed with patient? Yes  SHORT TERM GOALS: Target date: 07/19/22  I with initial HEP Baseline: Goal status: met 11/11/22  2. Decrease 5 x STS to < 25 sec to demonstrate improved functional strength  Baseline: 38 sec  Goal status: 08/09/22 14 sec-met  3. Decrease TUG to < 35 sec to demonstrate improved functional balance  Baseline: 47 sec  Goal status: 20 seconds 11/02/22  4. Perform sit <> stand transfers from average chair height with S and VC to be able to use the swivel chair at his dining table.  Baseline: Min A  Goal status: 09/16/22-Inconsistently performs  with S from average height. At times requires CGA to cue forward weight shift. ongoing  LONG TERM GOALS: Target date: 09/29/22  Patient will be able to step over his tub at home using grab bars for stability with CGA to access his shower. Baseline: Unable- has bench, but wife reports he steps over then sits. Goal status: 09/16/22-met  2.  Decrease 5 x STS to < 18 sec to demonstrate improved functional strength Baseline:  Goal status: not met 62 seconds and able to do only 3 09/29/22  3.  Decrease TUG to < 25 sec to demonstrate improved functional  balance Baseline:  Goal status: 11/02/22 20 seconds  4.  Patient will perform his bed mobility with MI. Baseline: Min A Goal status: on hard surface mat he is independent but on soft bed at home he struggles, wife reports only needs a hand to get up now 11/02/22  5.  Patient will ambulate at least 400' with LRAD, CGA, demonstrate improved upright posture, longer step length. Baseline:  Goal status: 09/16/22-Amb >400', but began to fatigue after about 300' with more flexed posture, L foot shuffling more, unable to maintain safe technique. ongoing  6.  Patient will manage up and down a single step with LRAD, CGA to access his deck at home. Baseline: Unable Goal status: 09/16/22-Up and down 1 step with U rail. met  ASSESSMENT:  CLINICAL IMPRESSION: Wife reports they saw Dr. Everlena Cooper, they will get a referral to see a parkinson's specialist soon,I  talked with her about the transfer to [facility] and it looks like we can get this to take place next week.  I have continued to work on his reaction to steps and his balance, he struggled today with attention and needed to be redirected.  He really shuffles with fatigue and cannot correct to pick up feet, needs to stop rest and then restart OBJECTIVE IMPAIRMENTS: Abnormal gait, decreased  activity tolerance, decreased balance, decreased cognition, decreased coordination, decreased endurance, decreased mobility, difficulty walking, decreased ROM, decreased strength, decreased safety awareness, impaired flexibility, impaired UE functional use, improper body mechanics, and postural dysfunction.   ACTIVITY LIMITATIONS: carrying, lifting, bending, sitting, standing, squatting, stairs, transfers, bed mobility, bathing, toileting, dressing, hygiene/grooming, and locomotion level  PARTICIPATION LIMITATIONS: meal prep, cleaning, laundry, interpersonal relationship, shopping, and community activity  PERSONAL FACTORS: Age, Past/current experiences, and 3+ comorbidities:  CVA, Parkinson's traits, dementia  are also affecting patient's functional outcome.   REHAB POTENTIAL: Good  CLINICAL DECISION MAKING: Evolving/moderate complexity  EVALUATION COMPLEXITY: Moderate  PLAN:  PT FREQUENCY: 2x/week  PT DURATION: 12 weeks  PLANNED INTERVENTIONS: Therapeutic exercises, Therapeutic activity, Neuromuscular re-education, Balance training, Gait training, Patient/Family education, Self Care, Joint mobilization, Stair training, Moist heat, and Manual therapy  PLAN FOR NEXT SESSION:Continue to work on his balance and function  , independence, safety and overall quality of life  work to transfer care when the order comes through Encounter Date: 11/15/2022   Jearld Lesch, PT 11/15/2022, 2:53 PM  [facility] at [facility]. [place], [place], [address] Phone: [phone]   Fax:  [phone]

## 2022-11-15 NOTE — Telephone Encounter (Signed)
Called pt and spoke with pts wife to inform them pts paperwork has been faxed and ready for pick up. Documents have been placed up front.

## 2022-11-16 ENCOUNTER — Ambulatory Visit: Payer: PPO | Admitting: Occupational Therapy

## 2022-11-16 ENCOUNTER — Encounter: Payer: Self-pay | Admitting: Occupational Therapy

## 2022-11-16 DIAGNOSIS — I69354 Hemiplegia and hemiparesis following cerebral infarction affecting left non-dominant side: Secondary | ICD-10-CM

## 2022-11-16 DIAGNOSIS — M6281 Muscle weakness (generalized): Secondary | ICD-10-CM

## 2022-11-16 DIAGNOSIS — I69318 Other symptoms and signs involving cognitive functions following cerebral infarction: Secondary | ICD-10-CM

## 2022-11-16 DIAGNOSIS — R278 Other lack of coordination: Secondary | ICD-10-CM

## 2022-11-16 DIAGNOSIS — R41840 Attention and concentration deficit: Secondary | ICD-10-CM

## 2022-11-16 NOTE — Patient Instructions (Addendum)
SHOULDER: Flexion - Sitting    Hold cane with both hands. Raise arms up. Keep elbows straight. Hold __5_ seconds.Do not use weight on  cane. _10__ reps per set, __2_ sets per day, __7_ days per week   Practice flipping playing cards  with your left hand   Practice picking up coins with your left hand  Copyright  VHI. All rights reserved.

## 2022-11-16 NOTE — Therapy (Addendum)
OUTPATIENT OCCUPATIONAL THERAPY TREATMENT NOTE  Patient Name: William [name] MRN: [id number] DOB:1941-05-28, 82 y.o., male Today's Date: 11/17/2022  PCP & REFERRING PROVIDER:  [name], [name], DO    END OF SESSION:  OT End of Session - 11/16/22 1452     Visit Number 30    Number of Visits 32    Date for OT Re-Evaluation 11/16/22    Authorization Type [employer]    Authorization - Visit Number 30    Progress Note Due on Visit 30    OT Start Time 1450    OT Stop Time 1530    OT Time Calculation (min) 40 min    Activity Tolerance Patient tolerated treatment well    Behavior During Therapy [facility] for tasks assessed/performed                              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   HTN (hypertension) 07/19/2015   Hyperlipidemia    Major neurocognitive disorder 01/09/2014   Mild, related to stroke history   Nocturia    Renal insufficiency 06/25/2013   S/P carotid endarterectomy    BILATERAL ICA--  PATENT PER DUPLEX  05-19-2012   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/26/2017   Right calf   Urinary frequency    Vitamin D deficiency    Past Surgical History:  Procedure Laterality Date   APPENDECTOMY  AS CHILD   CARDIOVASCULAR STRESS TEST  03-27-2012  DR CRENSHAW   LOW RISK LEXISCAN STUDY-- PROBABLE NORMAL PERFUSION AND SOFT TISSUE ATTENUATION/  NO ISCHEMIA/ EF 51%   CAROTID ENDARTERECTOMY Bilateral LEFT  11-12-2008  DR GREG HAYES   RIGHT ICA  2006  ([facility])   CHOLECYSTECTOMY N/A 02/23/2022   Procedure:  LAPAROSCOPIC CHOLECYSTECTOMY;  Surgeon: Quentin Ore, MD;  Location: [facility];  Service: General;  Laterality: N/A;   CYSTOSCOPY W/ RETROGRADES Bilateral 06/22/2021   Procedure: CYSTOSCOPY WITH RETROGRADE PYELOGRAM;  Surgeon: Marcine Matar, MD;  Location: [facility];  Service: Urology;  Laterality: Bilateral;   CYSTOSCOPY WITH LITHOLAPAXY N/A 02/26/2013   Procedure: CYSTOSCOPY WITH LITHOLAPAXY;  Surgeon: Marcine Matar, MD;  Location: [facility];  Service: Urology;  Laterality: N/A;   ENDOSCOPIC RETROGRADE CHOLANGIOPANCREATOGRAPHY (ERCP) WITH PROPOFOL N/A 02/22/2022   Procedure: ENDOSCOPIC RETROGRADE CHOLANGIOPANCREATOGRAPHY (ERCP) WITH PROPOFOL;  Surgeon: Jeani Hawking, MD;  Location: [facility] ENDOSCOPY;  Service: Gastroenterology;  Laterality: N/A;   EYE SURGERY  Jan. 2016   cataract surgery both eyes   INGUINAL HERNIA REPAIR Right 11-08-2006   IR KYPHO EA ADDL LEVEL THORACIC OR LUMBAR  02/12/2021   IR RADIOLOGIST EVAL & MGMT  02/18/2021   MASS EXCISION N/A 03/03/2016   Procedure: EXCISION OF BACK  MASS;  Surgeon: Almond Lint, MD;  Location: [facility];  Service: General;  Laterality: N/A;   MOHS SURGERY Left 1/ 2016   Dr Margo Aye-- Basal cell   PROSTATE SURGERY  REMOVAL OF STONES  02/22/2022   Procedure: REMOVAL OF STONES;  Surgeon: Jeani Hawking, MD;  Location: [facility] ENDOSCOPY;  Service: Gastroenterology;;   Dennison Mascot  02/22/2022   Procedure: Dennison Mascot;  Surgeon: Jeani Hawking, MD;  Location: [facility] ENDOSCOPY;  Service: Gastroenterology;;   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-C N/A 06/22/2021   Procedure: TRANSURETHRAL RESECTION OF BLADDER TUMOR;  Surgeon: Marcine Matar, MD;  Location: [facility];  Service: Urology;  Laterality: N/A;   TRANSURETHRAL RESECTION OF PROSTATE N/A 02/26/2013   Procedure: TRANSURETHRAL RESECTION OF THE PROSTATE WITH GYRUS INSTRUMENTS;  Surgeon: Marcine Matar, MD;  Location:  [facility];  Service: Urology;  Laterality: N/A;   TRANSURETHRAL RESECTION OF PROSTATE N/A 06/22/2021   Procedure: TRANSURETHRAL RESECTION OF THE PROSTATE (TURP);  Surgeon: Marcine Matar, MD;  Location[facility];  Service: Urology;  Laterality: N/A;       OUTPATIENT OCCUPATIONAL THERAPY TREATMENT NOTE  Patient Name: William [name] MRN: [id number] DOB:05-30-1941, 82 y.o., male Today's Date: 11/17/2022  PCP & REFERRING PROVIDER:  [name], [name], DO    END OF SESSION:  OT End of Session - 11/16/22 1452     Visit Number 30    Number of Visits 32    Date for OT Re-Evaluation 11/16/22    Authorization Type [employer]    Authorization - Visit Number 30    Progress Note Due on Visit 30    OT Start Time 1450    OT Stop Time 1530    OT Time Calculation (min) 40 min    Activity Tolerance Patient tolerated treatment well    Behavior During Therapy [facility] for tasks assessed/performed                              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   HTN (hypertension) 07/19/2015   Hyperlipidemia    Major neurocognitive disorder 01/09/2014   Mild, related to stroke history   Nocturia    Renal insufficiency 06/25/2013   S/P carotid endarterectomy    BILATERAL ICA--  PATENT PER DUPLEX  05-19-2012   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/26/2017   Right calf   Urinary frequency    Vitamin D deficiency    Past Surgical History:  Procedure Laterality Date   APPENDECTOMY  AS CHILD    CARDIOVASCULAR STRESS TEST  03-27-2012  DR CRENSHAW   LOW RISK LEXISCAN STUDY-- PROBABLE NORMAL PERFUSION AND SOFT TISSUE ATTENUATION/  NO ISCHEMIA/ EF 51%   CAROTID ENDARTERECTOMY Bilateral LEFT  11-12-2008  DR GREG HAYES   RIGHT ICA  2006  ([facility])   CHOLECYSTECTOMY N/A 02/23/2022   Procedure: LAPAROSCOPIC CHOLECYSTECTOMY;  Surgeon: Quentin Ore, MD;  Location: [facility];  Service: General;  Laterality: N/A;   CYSTOSCOPY W/ RETROGRADES Bilateral 06/22/2021   Procedure: CYSTOSCOPY WITH RETROGRADE PYELOGRAM;  Surgeon: Marcine Matar, MD;  Location: [facility];  Service: Urology;  Laterality: Bilateral;   CYSTOSCOPY WITH LITHOLAPAXY N/A 02/26/2013   Procedure: CYSTOSCOPY WITH LITHOLAPAXY;  Surgeon: Marcine Matar, MD;  Location: [facility];  Service: Urology;  Laterality: N/A;   ENDOSCOPIC RETROGRADE CHOLANGIOPANCREATOGRAPHY (ERCP) WITH PROPOFOL N/A 02/22/2022   Procedure: ENDOSCOPIC RETROGRADE CHOLANGIOPANCREATOGRAPHY (ERCP) WITH PROPOFOL;  Surgeon: Jeani Hawking, MD;  Location: [facility] ENDOSCOPY;  Service: Gastroenterology;  Laterality: N/A;   EYE SURGERY  Jan. 2016   cataract surgery both eyes   INGUINAL HERNIA REPAIR Right 11-08-2006   IR KYPHO EA ADDL LEVEL THORACIC OR LUMBAR  02/12/2021   IR RADIOLOGIST EVAL & MGMT  02/18/2021   MASS EXCISION N/A 03/03/2016   Procedure: EXCISION OF BACK  MASS;  Surgeon: Almond Lint, MD;  Location: [facility];  Service: General;  Laterality: N/A;   MOHS SURGERY Left 1/ 2016   Dr Margo Aye-- Basal cell   PROSTATE SURGERY     REMOVAL OF STONES  02/22/2022   Procedure: REMOVAL OF STONES;  Surgeon: Jeani Hawking, MD;  Location: [facility] ENDOSCOPY;  Service: Gastroenterology;;   Dennison Mascot  02/22/2022   Procedure: Dennison Mascot;  Surgeon: Jeani Hawking, MD;  Location: [facility] ENDOSCOPY;  Service: Gastroenterology;;   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-C N/A 06/22/2021   Procedure: TRANSURETHRAL RESECTION OF  BLADDER TUMOR;  Surgeon: Marcine Matar, MD;  Location: [facility];  Service: Urology;  Laterality: N/A;   TRANSURETHRAL RESECTION OF PROSTATE N/A 02/26/2013   Procedure: TRANSURETHRAL RESECTION OF THE PROSTATE WITH GYRUS INSTRUMENTS;  Surgeon: Marcine Matar, MD;  Location: [facility];  Service: Urology;  Laterality: N/A;   TRANSURETHRAL RESECTION OF PROSTATE N/A 06/22/2021   Procedure: TRANSURETHRAL RESECTION OF THE PROSTATE (TURP);  Surgeon: Marcine Matar, MD;  Location: [facility];  Service: Urology;  Laterality: N/A;   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   Insomnia    Prediabetes    Acute 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 (HCC)    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   Transaminitis 07/27/2021   UTI (urinary tract infection) 07/27/2021   CVA (cerebral vascular accident) (HCC) 07/27/2021   Fall 07/27/2021   Hyperglycemia 07/27/2021   Cholelithiasis 07/27/2021   Hypoxia 07/27/2021   Nausea and vomiting 07/27/2021   Acute metabolic encephalopathy 07/27/2021   Normocytic anemia 07/27/2021   Chronic back pain 07/27/2021   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   Closed fracture of first lumbar vertebra with routine healing 02/03/2021   Closed fracture of multiple ribs 11/18/2020   Anxiety  01/29/2020   Leg pain, bilateral 01/29/2020   Ingrown toenail 07/13/2019   Lumbar spondylosis 05/02/2018   Pain in left knee 03/09/2018   Osteoarthritis of left hip 01/16/2018   Trochanteric bursitis of left hip 01/16/2018   Preventative health care 09/26/2017   HTN (hypertension) 07/19/2015   Hyperlipidemia 07/19/2015   Great toe pain 02/11/2014  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   Obesity (BMI 30-39.9) 06/25/2013   Renal insufficiency 06/25/2013   Weakness of left arm 06/25/2013   Sebaceous cyst 03/03/2011   Sprain of lumbar region 07/31/2010   Rib pain, left 08/29/2009   Carotid artery stenosis, asymptomatic, bilateral 05/02/2009   Eczema, atopic 05/31/2008   Vitamin D deficiency 03/01/2008   BPH (benign prostatic hyperplasia) 08/06/2007   Fasting hyperglycemia 12/21/2006   History of right MCA infarct 2006    ONSET DATE: Acute exacerbation of chronic symptoms (most recent hospitalization 02/26/22 - 03/12/22, followed by SNF and home health)   REFERRING DIAG:  I63.9 (ICD-10-CM) - Cerebrovascular accident (CVA), unspecified mechanism (HCC)  W19.XXXD (ICD-10-CM) - Fall, subsequent encounter    THERAPY DIAG:  Muscle weakness (generalized)  Hemiplegia and hemiparesis following cerebral infarction affecting left non-dominant side (HCC)  Other lack of coordination  Attention and concentration deficit  Other symptoms and signs involving cognitive functions following cerebral infarction  Rationale for Evaluation and Treatment: Rehabilitation  PERTINENT HISTORY: PMHx includes L basal ganglia and corona radiata infarct 07/27/21, L lower homonymous quadrantanopia; R MCA infarct in 2006 w/ L-sided hemiparesis, NCD, carotid artery disease (bilateral), HTN, HLD,CKD3, vascular dementia, and hx of bladder cancer. He has had falls in 2023, fracturing his clavicle in August, then he had stomach surgery in Sept 2023. He has He needs assist at baseline for ADLs. When last  documented in acute rehab after having his gall-bladder removed, (03/09/22) he required moderate to maximum assist x2 for most ADLs (more assist for ADLs the require standing), and they recommended rehab in skilled nursing setting. He apparently got Covid at some point and also attended a SNF. Once he D/C home, he had some episodes of combativeness at night  with "sun-downing."  He is communicative and appropriate, though appears easily confused and distracted, not fully oriented to time and place (Ax2, states date is 20 and cannot say what city he lives in). His wife provides most details, and he is participative, though a poor historian at times. In a nut shell- he has been suffering from dementia and post-stroke effects for years, and his issues were recently exacerbated in Sept of last year after abdominal surgery. He has less use of Lt "stroke side" and new, ritualistic "ticks" in mouth and Rt hand/arm.  Wife states that they do have a caregiver come M, W, F for 4 hours in the middle of the day to mainly help him bathe and dress, which usually tires him out. He has been more combative towards the end of the day as well.   PRECAUTIONS: Fall and Other: Lt hemi, poor cognition, LATEX and adhesive allergies, etc.  ;WEIGHT BEARING RESTRICTIONS: No   SUBJECTIVE:   SUBJECTIVE STATEMENT: Pt reports eating cherries at home Pt accompanied by:  his wife   PAIN: no     FALLS: Has patient fallen in last 6 months? Yes. Number of falls at least 1 resulting in clavicle fx (now healed)   LIVING ENVIRONMENT: Lives with: lives with their spouse Lives in: House/apartment Has following equipment at home: manual w/c, 2 w/w, weighted utensils, shower bench (for tub), bed cane, hospital bed, raised toilet, and possibly more  PLOF: Needs assistance with ADLs, Needs assistance with homemaking, Needs assistance with gait, and Needs assistance with transfers  PATIENT GOALS: at eval- increase personal hygiene  skills (brush teeth, shaving)  prepare instant coffee or microwave meal improve functional cognitive skills to play games, etc.,   increase Lt  arm usage try to help decrease Rt hand/arm perseveration (flicking, tapping, noise making) Improve fnl tranfers in/out of multiple seats (tub bench, swivel chair, etc.)    OBJECTIVE: (All objective assessments below are from initial evaluation on: 07/07/22 unless otherwise specified.)   HAND DOMINANCE: Right  ADLs: Overall ADLs: Wife states decreased ability since hospitalization in Sept 23.  Transfers/ambulation related to ADLs: Min A transfer, CGA for mobility Eating: set up Grooming: Mod A- needs assist with oral care, shaving, but can comb his hair UB Dressing: Min A  LB Dressing: Dep  Toileting:  Mod  Bathing: Mod  Tub Shower transfers: Min A  Equipment: Emergency planning/management officer, Grab bars, and Feeding equipment  IADLs: completely dependent for years now, except some ability to use microwave which is now not possible after hospitalization   MOBILITY STATUS: Needs Assist: CGA - Min A, Hx of falls, Festination, difficulty with turns, and neglect on Lt side  POSTURE COMMENTS:  rounded shoulders, forward head, and weight shift left  ACTIVITY TOLERANCE: Activity tolerance: he became tired and somewhat agitated by the end of the OT evaluation today (after PT and OT about 2.5hours with mostly sedate activities in seated positions)  FUNCTIONAL OUTCOME MEASURES: [facility] 17/30  showing problems with visuospatial/executive function, attention, fluency, and orientation. (at least 26/30 is needed to be considered "normal")  [employer] in ADLs: 1/6  (very dependent)  UPPER EXTREMITY ROM:  from initial eval  Active ROM Right eval Left eval  Shoulder scaption 100 85  Shoulder abduction    Shoulder adduction    Shoulder extension    Shoulder internal rotation Reaches small of back Reaches to hip  Shoulder external rotation Reaches back of head   Reaches up to ear  Elbow flexion 150 130  Elbow extension (-10*)  (-35*)  Wrist flexion 35 20  Wrist extension 50 10  (Blank rows = not tested)  UPPER EXTREMITY MMT:    Tested grossly as "push" and "pull": UE push 4-/5, UE pull 4/5  HAND FUNCTION: Grip strength: Right: 30 lbs; Left: 20 lbs  COORDINATION: Finger Nose Finger test: mildly ataxic but fairly accurate  Box and Blocks:  Right 11blocks, Left 5blocks  MUSCLE TONE:  Rt- normal, Lt- decreased and worsened by inattention/disuse  COGNITION: Overall cognitive status: Impaired and Difficulty to assess due to: severity of deficits; Poor attention, fluency, orientation, and executive function. Recall is fairly good as well as abstraction.  Seems to have personality changes due to vascular dementia- like an OCD type volitional perseveration of tapping with Rt hand that he can somewhat control.  He did become angry and agitated at the end of the session performing car transfer with spouse: he did shout at her.  VISION: Subjective report: no new changes per pt/spouse, uses "cheaters"   VISION ASSESSMENT: Visual fields seem intact though he had latent responses on the left side showing likely inattention/and/neglect, tracking somewhat delayed horizontally and vertically  PERCEPTION: Impaired: Inattention/neglect: does not attend to left visual field and does not attend to left side of body, Body scheme: poor quality of recognition with shaving, etc., and Spatial orientation: decreased in print and in orientation, especially to Lt side  PRAXIS: Not tested specifically but no overt/stated issues using pen, wearing glasses ,etc.   OBSERVATIONS: Pt taps and flicks and does seem to volitionally perseverate on making a rhythm/noise with Rt hand. He states he is aware of it and chooses to do it.  He does also seem to have mild  resting and intention tremor.    TODAY'S TREATMENT:     DATE:11/16/22- Therapist checked progress towards goals and  discussed with pt/ wife.  Discussion regarding plans to discharge vs. continue with occupational therapy. Pt has started sinemet, and so pt's wife would like to continue OT at [facility]. Discussion with pt/ wife regarding use of  PWR! Up with sit to stand and weight shifts with other freezing episodes. Pt's wife reports freezing episodes in the BR. Pt. flipped cards and picked up penning and quarters off of shelf liner with mod v.c and min-mod difficulty today using LUE. This is an improvement in LUE and attention to left side. Closed chain shoulder flexion in front and reaching for the floor, min-mod v.c         PATIENT EDUCATION: Education details:  progress towards goals and plans for transition to [facility]- neuro,  Coordination tasks with  flipping cards and picking up coins for LUE, closed chain shoulder flexion with dowel Person educated: Patient and Spouse Education method: Explanation, Demonstration, Tactile cues, and Verbal cues, handout Education comprehension: Explanation, demonstration, v.c  HOME EXERCISE PROGRAM:  11/17/22- flipping cards, picking up coins, closed chain shoulder flexion with dowel     GOALS: Goals reviewed with patient? Yes   SHORT TERM GOALS: (STG required if POC>30 days) Target Date: 09/23/22   Pt will demo/state understanding of initial HEP (or home activity suggestions to maintain function as appropriate) to improve functional skills. Goal status: 07/28/22- Met  2. Pt will donn a t-shirt consistently with min A and v.c   Goal status: met , per wife's report 3. Pt will demonstrate ability to open the newspaper  with supervision min v.c Goal status:  not met, not consistent 11/16/22 4.  Pt will consistently sustain focus to a simple functional task  in a minimally distracting environment x 6 mins with no more than 1 re-direct(ie: eating, functional task in clinic)  Goal status: partially met, pt demonstrates ability to perform in clinic when engaged in a task that  is meaningful to him 10/26/22  5. Pt will consistently open containers with only supervision and min v.c           Goal status: met, Pt is performing at home per wife's report 11/03/22  LONG TERM GOALS: Target Date: 11/16/22  Pt will improve functional ability by decreased impairment per Trinna Balloon assessment from 1/6 to 2/6 or better, for better quality of life. Goal status:  not met 1/6  2.  Caregiver will state his grooming ability/hygiene ability improved from moderate assist to supervision assist. Goal status: not met-Pt continues to require mod A.  3.  He will show better functional use of left arm by improved box and blocks Test score from 5 blocks to at least 10 blocks. Goal status:not met but improved 8 blocks  4.  Pt will show ability to microwave instant coffee or small meal safely, to help decrease caregiver burden. Goal status: partially met, pt is able to microwave coffee with supervision, however occasional min A to open containers 5.  He will show improved cognitive ability enough to play a simple game with OT and caregiver. Goal status: approximating goal, pt has played a game of uno at home with family.  6.  Caregiver will state at least 4 ways to decrease perseveration with right arm, also stating that this behaviors at least somewhat better in the home now. Goal status: deferred perseverative behaviors have improved.  7. Pt will donn a  flannel button front shirt consistently with min A and min v.c Baseline: mod-max Goal status:  not met mod A and mod v.c  8.  Pt will use his LUE as an active assist for ADLS grossly 40% of the time with min v.c Baseline: uses 25% Goal status:  not met, uses 25 % 11/16/22  9 Pt will stand at the countertop and perform functional activities  incorporating bilateral UE's with supervision and min v.c  Goal status:  not met, inconsistent use of LUE,  10. Pt will back up to a chair and sit down squarely at least 1/3 of the time with  min v.c  Goal status: not met inconsistent 11/16/22 11..  Pt will open the newspaper and locate sports section with no more than min v.c                          Goal status:   not met, mod A 11/16/22  ASSESSMENT:  CLINICAL IMPRESSION: For the reporting period of 08/20/22- 11/16/22, pt progress was limited by severity of deficits and L neglect. Pt has recently started Sinemet and he demonstrated improved attention, movement and LUE functional use today. Pt is transferring to [facility]- neuro site. Pt goals were checked today and occupational therapist at that site will establish new goals with pt and wife.  OT Frequency/DURATION: 2x week x 12 weeks however anticipate d/c after 8 weeks dependent on progress  PLANNED INTERVENTIONS: self care/ADL training, therapeutic exercise, therapeutic activity, neuromuscular re-education, manual therapy, balance training, functional mobility training, moist heat, cryotherapy, patient/family education, cognitive remediation/compensation, visual/perceptual remediation/compensation, psychosocial skills training, energy conservation, coping strategies training, DME and/or AE instructions, and Re-evaluation  RECOMMENDED OTHER SERVICES: He has already seen PT for balance and functional mobility, he would probably benefit from speech for cognitive issues as well, though OT will be working with cognition somewhat functionally now.  CONSULTED AND AGREED WITH PLAN OF CARE: Patient and family member/caregiver  PLAN FOR NEXT SESSION: Pt to transfer to [facility]- neuro, renew and update goals.   Keene Breath, OTR/L 12:58 PM 11/17/22

## 2022-11-17 ENCOUNTER — Encounter: Payer: Self-pay | Admitting: Occupational Therapy

## 2022-11-18 ENCOUNTER — Encounter: Payer: Self-pay | Admitting: Physical Therapy

## 2022-11-18 ENCOUNTER — Ambulatory Visit: Payer: PPO | Admitting: Physical Therapy

## 2022-11-18 DIAGNOSIS — M6281 Muscle weakness (generalized): Secondary | ICD-10-CM | POA: Diagnosis not present

## 2022-11-18 DIAGNOSIS — R262 Difficulty in walking, not elsewhere classified: Secondary | ICD-10-CM

## 2022-11-18 DIAGNOSIS — R296 Repeated falls: Secondary | ICD-10-CM

## 2022-11-18 DIAGNOSIS — R278 Other lack of coordination: Secondary | ICD-10-CM

## 2022-11-18 DIAGNOSIS — I69354 Hemiplegia and hemiparesis following cerebral infarction affecting left non-dominant side: Secondary | ICD-10-CM

## 2022-11-18 NOTE — Therapy (Signed)
OUTPATIENT PHYSICAL THERAPY NEURO Treatment   Patient Name: William [name] MRN: [id number] DOB:1940/08/20, 82 y.o., male Today's Date: 11/18/2022  PCP: Donato Schultz  DO REFERRING PROVIDER: same  END OF SESSION:  PT End of Session - 11/18/22 1452     Visit Number [id number]    Date for PT Re-Evaluation 12/03/22    Authorization Type HTA    PT Start Time 1445    PT Stop Time 1528    PT Time Calculation (min) 43 min    Equipment Utilized During Treatment Gait belt    Activity Tolerance Patient tolerated treatment well    Behavior During Therapy WFL for tasks assessed/performed                 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   HTN (hypertension) 07/19/2015   Hyperlipidemia    Major neurocognitive disorder 01/09/2014   Mild, related to stroke history   Nocturia    Renal insufficiency 06/25/2013   S/P carotid endarterectomy    BILATERAL ICA--  PATENT PER DUPLEX  05-19-2012   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/26/2017   Right calf   Urinary frequency    Vitamin D deficiency    Past Surgical History:  Procedure Laterality Date   APPENDECTOMY  AS CHILD   CARDIOVASCULAR STRESS TEST  03-27-2012  DR CRENSHAW   LOW RISK LEXISCAN STUDY-- PROBABLE NORMAL PERFUSION AND SOFT TISSUE ATTENUATION/  NO ISCHEMIA/ EF 51%   CAROTID ENDARTERECTOMY Bilateral LEFT  11-12-2008  DR GREG HAYES   RIGHT ICA  2006  ([facility])   CHOLECYSTECTOMY N/A 02/23/2022   Procedure: LAPAROSCOPIC CHOLECYSTECTOMY;  Surgeon: Quentin Ore, MD;  Location: [facility];  Service:  General;  Laterality: N/A;   CYSTOSCOPY W/ RETROGRADES Bilateral 06/22/2021   Procedure: CYSTOSCOPY WITH RETROGRADE PYELOGRAM;  Surgeon: Marcine Matar, MD;  Location: [facility];  Service: Urology;  Laterality: Bilateral;   CYSTOSCOPY WITH LITHOLAPAXY N/A 02/26/2013   Procedure: CYSTOSCOPY WITH LITHOLAPAXY;  Surgeon: Marcine Matar, MD;  Location: [facility];  Service: Urology;  Laterality: N/A;   ENDOSCOPIC RETROGRADE CHOLANGIOPANCREATOGRAPHY (ERCP) WITH PROPOFOL N/A 02/22/2022   Procedure: ENDOSCOPIC RETROGRADE CHOLANGIOPANCREATOGRAPHY (ERCP) WITH PROPOFOL;  Surgeon: Jeani Hawking, MD;  Location: [facility] END[facility]COPY;  Service: Gastroenterology;  Laterality: N/A;   EYE SURGERY  Jan. 2016   cataract surgery both eyes   INGUINAL HERNIA REPAIR Right 11-08-2006   IR KYPHO EA ADDL LEVEL THORACIC OR LUMBAR  02/12/2021   IR RADIOLOGIST EVAL & MGMT  02/18/2021   MASS EXCISION N/A 03/03/2016   Procedure: EXCISION OF BACK  MASS;  Surgeon: Almond Lint, MD;  Location: [facility];  Service: General;  Laterality: N/A;   MOHS SURGERY Left 1/ 2016   Dr Margo Aye-- Basal cell   PROSTATE SURGERY     REMOVAL OF STONES  02/22/2022   Procedure: REMOVAL OF STONES;  Surgeon: Jeani Hawking, MD;  Location: [facility] ENDOSCOPY;  Service: Gastroenterology;;   Dennison Mascot  02/22/2022  Procedure: SPHINCTEROTOMY;  Surgeon: Jeani Hawking, MD;  Location: [facility] ENDOSCOPY;  Service: Gastroenterology;;   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-C N/A 06/22/2021   Procedure: TRANSURETHRAL RESECTION OF BLADDER TUMOR;  Surgeon: Marcine Matar, MD;  Location: [facility];  Service: Urology;  Laterality: N/A;   TRANSURETHRAL RESECTION OF PROSTATE N/A 02/26/2013   Procedure: TRANSURETHRAL RESECTION OF THE PROSTATE WITH GYRUS INSTRUMENTS;  Surgeon: Marcine Matar, MD;  Location: [facility];  Service: Urology;  Laterality: N/A;   TRANSURETHRAL RESECTION OF  PROSTATE N/A 06/22/2021   Procedure: TRANSURETHRAL RESECTION OF THE PROSTATE (TURP);  Surgeon: Marcine Matar, MD;  Location: [facility];  Service: Urology;  Laterality: N/A;   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   Insomnia    Prediabetes    Acute 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 (HCC)    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   Transaminitis 07/27/2021   UTI (urinary tract infection) 07/27/2021   CVA (cerebral vascular accident) (HCC) 07/27/2021   Fall 07/27/2021   Hyperglycemia 07/27/2021   Cholelithiasis 07/27/2021   Hypoxia 07/27/2021   Nausea and vomiting 07/27/2021   Acute metabolic encephalopathy 07/27/2021   Normocytic anemia 07/27/2021   Chronic back pain 07/27/2021   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   Closed fracture of first lumbar vertebra with routine healing 02/03/2021   Closed fracture of multiple ribs 11/18/2020   Anxiety 01/29/2020   Leg pain, bilateral 01/29/2020   Ingrown toenail 07/13/2019   Lumbar spondylosis 05/02/2018   Pain in left knee 03/09/2018   Osteoarthritis of left hip 01/16/2018   Trochanteric bursitis of left hip 01/16/2018   Preventative health care 09/26/2017   HTN (hypertension) 07/19/2015   Hyperlipidemia 07/19/2015   Great toe pain 02/11/2014   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   Obesity (BMI 30-39.9) 06/25/2013   Renal insufficiency 06/25/2013   Weakness of left arm 06/25/2013   Sebaceous cyst 03/03/2011   Sprain of lumbar region 07/31/2010   Rib pain, left 08/29/2009   Carotid artery stenosis, asymptomatic, bilateral 05/02/2009   Eczema, atopic 05/31/2008   Vitamin D deficiency 03/01/2008   BPH (benign prostatic hyperplasia) 08/06/2007   Fasting hyperglycemia 12/21/2006   History of right MCA infarct 2006    ONSET DATE: 06/29/22  REFERRING DIAG:  Diagnosis  I63.9 (ICD-10-CM) - Cerebrovascular accident (CVA), unspecified mechanism (HCC)  W19.XXXD (ICD-10-CM) - Fall, subsequent encounter    THERAPY DIAG:  Muscle weakness (generalized)  Hemiplegia and hemiparesis following cerebral infarction affecting left non-dominant side (HCC)  Other lack of coordination  Repeated falls  Difficulty in walking, not elsewhere classified  Rationale for Evaluation and Treatment: Rehabilitation  SUBJECTIVE:  SUBJECTIVE STATEMENT: Patients wife reports that she gave him the medicine this afternoon and feels that he is not doing as well, reports that he got off balance a few times and they had to stop and regroup when walking in. PERTINENT HISTORY:  ANTIONNE ROANE is a 81 year old man with dementia, CKD, HTN, CAD, HLD and history of CVA  (2006, 2021), Parkinson's  PAIN:  Are you having pain? No  PRECAUTIONS: Fall  WEIGHT BEARING RESTRICTIONS: No  FALLS: Has patient fallen in last 6 months? No  LIVING ENVIRONMENT: Lives with: lives with their family and lives with their spouse Lives in: House/apartment Stairs:  1 brick high step Has following equipment at home: Environmental consultant - 2 wheeled, Wheelchair (manual), Shower bench, bed side commode, Grab bars, and  hospital bed, sliding pad for car  PLOF: Independent with basic ADLs and Independent with household mobility without device  PATIENT GOALS: Walk around the block with his wife. Be able to use the dining room chair rather than have to use the W/C. Increased I with bed mobility and simple tasks at home like make some coffee or brush his teeth, Step over tub to shower, has bench, but he can't really use it. Up and down the one step to get to back deck.  OBJECTIVE:   DIAGNOSTIC FINDINGS:  MRI 2021 with degenerative changes in lumbar spine, L3-4 R subarticular stenosis  COGNITION: Overall cognitive status: History of cognitive impairments - at baseline   SENSATION: Not tested  COORDINATION: Moderately impaired BLE   MUSCLE LENGTH: Hamstrings: severely restricted B Thomas test: Severely restricted B.  POSTURE: rounded shoulders, forward head, increased thoracic kyphosis, posterior pelvic tilt, and flexed trunk   LOWER EXTREMITY ROM:   BLE extremely stiff throughout, limited hip ROM in all planes, B knees limited in extension.   LOWER EXTREMITY MMT:  3+/5 throughout. Unable to determine accuracy of MMT due to cognition. Functionally demosntrates poor coordinated activation and poor muscular endurance.   BED MOBILITY: Min A for Supine<> Sit. Patient was using a ladder in his hospital bed and could move MI.  TRANSFERS: Min A, mod VC, tends to lean back.  11/02/22 independent with use of hands on arm chair with a couple of tries  CURB: Min A-Has one step out to his deck.  STAIRS: N/A   GAIT: Gait pattern: step to pattern, decreased arm swing- Right, decreased arm swing- Left, decreased step length- Right, decreased step length- Left, shuffling, festinating, trunk flexed, narrow BOS, poor foot clearance- Right, and poor foot clearance- Left Distance walked: 42' Assistive device utilized: Environmental consultant - 2 wheeled Level of assistance: Occasional min A Comments: mod VC to stay close to  walker, take longer steps.  FUNCTIONAL TESTS:  5 times sit to stand: 38.23  09/29/22  could not performs 5XSTS, 11/02/22 = 47 seconds with cues to use arms Timed up and go (TUG): 47.91  with RW, 07/21/22 TUG 29  seconds without device, 09/29/22 25 seconds, 11/02/22 TUG 20 seconds  TODAY'S TREATMENT:  DATE:  11/18/22 UBE level 4 x 6 minutes Gait outside half the parking [place] HHA cues for step but this part very minimal issues, small rest break, then outside around to the front of the building, required 3 standing rest breaks and much more cues to do better steps as he was shuffling more Step/turn/reach Seated PWR moves with a lot of cues and some assist for the left UE tot he back and up Stairs 4" and 6" step over step with right hand rail Standing pbars marching and hip abduction Step to cones PT said out the color and he stepped to Talked with wife about the transfer to [place]  11/15/22 Gait outside 1/2 parking [place] and then rest, HHA, then up and back around the building, when he fatigues he starts to shuffle and needs cues to stop and rest and then big steps, at times needs to count with going through doors In Pbars step and reach Step toward cone color that PT calls out [place] toss and ball kicks Side stepping Standing hip marches and hip abduction Standing on airex 5# pulleys with left arm and then with right arm On airex reaching  11/10/32 Nustep level 4 x 5 minutes Gait outside around half parking [place] wihtout rest until back at the building, then rest and walk int he grass along the side of the building and back into the building with him pushing the open door buttons, gait belt and mostly HHA, cues for step length, some cues to count when going through doors Folding towels and then carry them and stack them in closet with SBA/CGA for this Standing  volleyball with SBA HHA direction changes Gait to the car HHA, car transfer  11/04/22 UBE level 4 x 6 mintues Gait outside light HHA, some cues for bigger steps Supine bridges HS stretches Trunk rotation in supine Bed mobility scooting to one side and then back Volley ball Ball kicks Hip abduction Gait outside again, curbs slopes and then toe touches on the curb alternating  11/02/22 Nustep level 5 x 6 minutes Gait outside with gait belt and HHA, cues verbal and tactile for speed and step length Ball tapping sitting for core and standing for balance Func reaching and carrying for balance Navigating on and over obstalces cg-min with cuing to get closer ot object before he steps- he tends to step from too far away from object STS working on leg strength holding ball 10x Step taps 6 inc 10 x each leg then alt increased LOB Gait outside HHA/CGA better steps today   10/28/22 Amb outside with gait belt and HHA with gentle pull to work on increased cadence and step thru- stressing with shuffled steps and leaning fwd Walking in clinic looking for cones  and working on scanning, turns and navigating obstacles  [place] tap standing and sitting balloon taps Obstacle course navigation over and around- min A with cuing for safety to get closer before stepping on or over HS curl 2 sets 10 20# Knee ext 2 sets 10 10# Alt taps 6 inch 20 x 2 sets CGA Ball toss working on 2 hand catch STS with wt ball press working on 2 hands slightly elevated mat  10/25/21 Nustep level 5 x 6 mintues Folding towels from low surface and then stacking towels up on a high surface at his side so he was having to turn, then we carried it and stacked it on a shelf 2.5# LAQ's 2.5# marches 2.5# hip abduction standing Green tband ankle  PF/DF Green tband HS curls Standing with HHA cone toe touches Gait HHA with slight pull to get faster and bigger steps Volleyball in standing  10/21/22 Gait outside 1/2 parking  [place] Folding towels from low surface to high surface with turn while standing, carry towels and stack Volley ball sitting and standing Ball kicks On mat table bridges, rolls, green tband clamshells, partial sit ups Got him in quadraped raise one arm and then leg, difficulty getting gout of this position Step and turn and touch in the pobars Gait again out to car  10/19/22 Walk outside half way around parking [place] and a rest, then around our building back through the front door,  Volley ball sitting and standing, ball kicks sitting and standing In pbars marching and hip abduction 12" toe clears Tband on floor working on longer steps Sit to stand elevated surface cues needed Stairs step over step up and down Gait outside again cues and CGA  10/13/22 Walk outside with wife and patient, had wife do the HHA and the CGA, gave her instruction in safety and verbal cues as well as tactile cues HS curls 20# 3x10 Leg extension 5# 3x10 Leg press 20# 2x10 5# single arm pulls 4" step to his left working on picking leg up and clearing Gait again 4 x 100 feet with me more in front of him and him trying to put his feet where I am, this seemed to help with the step length  PATIENT EDUCATION: Education details: POC Person educated: Patient and Spouse Education method: Explanation Education comprehension: verbalized understanding  HOME EXERCISE PROGRAM: TBD  GOALS: Goals reviewed with patient? Yes  SHORT TERM GOALS: Target date: 07/19/22  I with initial HEP Baseline: Goal status: met 11/11/22  2. Decrease 5 x STS to < 25 sec to demonstrate improved functional strength  Baseline: 38 sec  Goal status: 08/09/22 14 sec-met  3. Decrease TUG to < 35 sec to demonstrate improved functional balance  Baseline: 47 sec  Goal status: 20 seconds 11/02/22  4. Perform sit <> stand transfers from average chair height with S and VC to be able to use the swivel chair at his dining table.  Baseline: Min  A  Goal status: 09/16/22-Inconsistently performs  with S from average height. At times requires CGA to cue forward weight shift. ongoing  LONG TERM GOALS: Target date: 09/29/22  Patient will be able to step over his tub at home using grab bars for stability with CGA to access his shower. Baseline: Unable- has bench, but wife reports he steps over then sits. Goal status: 09/16/22-met  2.  Decrease 5 x STS to < 18 sec to demonstrate improved functional strength Baseline:  Goal status: not met 62 seconds and able to do only 3 09/29/22  3.  Decrease TUG to < 25 sec to demonstrate improved functional balance Baseline:  Goal status: 11/02/22 20 seconds  4.  Patient will perform his bed mobility with MI. Baseline: Min A Goal status: on hard surface mat he is independent but on soft bed at home he struggles, wife reports only needs a hand to get up now 11/02/22  5.  Patient will ambulate at least 400' with LRAD, CGA, demonstrate improved upright posture, longer step length. Baseline:  Goal status: he can go about 300 feet with HHA without rest but cues for step length 11/18/22  6.  Patient will manage up and down a single step with LRAD, CGA to access his deck at home. Baseline:  Unable Goal status: 09/16/22-Up and down 1 step with U rail. met  ASSESSMENT:  CLINICAL IMPRESSION: This will be patients last day here,he is going to transfer to [place] and see the parkinson's specialist.  He has started a new medication, the OT felt he did very well yesterday after the dose, today he was more distracted so I did much of the treatment isolated from the activity, the first walk was good with good step length, after that there was much more shuffling.  I have initiated some of the PWR moves but due to the stroke affected side he needs assist OBJECTIVE IMPAIRMENTS: Abnormal gait, decreased activity tolerance, decreased balance, decreased cognition, decreased coordination, decreased endurance, decreased mobility,  difficulty walking, decreased ROM, decreased strength, decreased safety awareness, impaired flexibility, impaired UE functional use, improper body mechanics, and postural dysfunction.   ACTIVITY LIMITATIONS: carrying, lifting, bending, sitting, standing, squatting, stairs, transfers, bed mobility, bathing, toileting, dressing, hygiene/grooming, and locomotion level  PARTICIPATION LIMITATIONS: meal prep, cleaning, laundry, interpersonal relationship, shopping, and community activity  PERSONAL FACTORS: Age, Past/current experiences, and 3+ comorbidities: CVA, Parkinson's traits, dementia  are also affecting patient's functional outcome.   REHAB POTENTIAL: Good  CLINICAL DECISION MAKING: Evolving/moderate complexity  EVALUATION COMPLEXITY: Moderate  PLAN:  PT FREQUENCY: 2x/week  PT DURATION: 12 weeks  PLANNED INTERVENTIONS: Therapeutic exercises, Therapeutic activity, Neuromuscular re-education, Balance training, Gait training, Patient/Family education, Self Care, Joint mobilization, Stair training, Moist heat, and Manual therapy  PLAN FOR NEXT SESSION:Continue to work on his balance and function  , independence, safety and overall quality of life  work to transfer care when the order comes through Encounter Date: 11/18/2022   Jearld Lesch, PT 11/18/2022, 2:52 PM  [facility] [place], [place], [address] Phone: [phone]   Fax:  [phone]

## 2022-11-21 ENCOUNTER — Encounter: Payer: Self-pay | Admitting: Neurology

## 2022-11-22 ENCOUNTER — Other Ambulatory Visit (HOSPITAL_BASED_OUTPATIENT_CLINIC_OR_DEPARTMENT_OTHER): Payer: Self-pay

## 2022-11-22 ENCOUNTER — Ambulatory Visit: Payer: PPO | Admitting: Physical Therapy

## 2022-11-23 ENCOUNTER — Ambulatory Visit: Payer: PPO | Admitting: Occupational Therapy

## 2022-11-23 ENCOUNTER — Encounter: Payer: Self-pay | Admitting: Rehabilitative and Restorative Service Providers"

## 2022-11-23 ENCOUNTER — Ambulatory Visit: Payer: PPO | Admitting: Rehabilitative and Restorative Service Providers"

## 2022-11-23 DIAGNOSIS — M6281 Muscle weakness (generalized): Secondary | ICD-10-CM | POA: Diagnosis not present

## 2022-11-23 DIAGNOSIS — I69354 Hemiplegia and hemiparesis following cerebral infarction affecting left non-dominant side: Secondary | ICD-10-CM

## 2022-11-23 DIAGNOSIS — R262 Difficulty in walking, not elsewhere classified: Secondary | ICD-10-CM

## 2022-11-23 DIAGNOSIS — I69318 Other symptoms and signs involving cognitive functions following cerebral infarction: Secondary | ICD-10-CM

## 2022-11-23 DIAGNOSIS — R296 Repeated falls: Secondary | ICD-10-CM

## 2022-11-23 DIAGNOSIS — R2689 Other abnormalities of gait and mobility: Secondary | ICD-10-CM

## 2022-11-23 NOTE — Therapy (Signed)
OUTPATIENT PHYSICAL THERAPY NEURO Treatment and Renewal Summary   Patient Name: William [name] MRN: [id number] DOB:07/10/1940, 82 y.o., male Today's Date: 11/23/2022  PCP: Donato Schultz  DO REFERRING PROVIDER: same  END OF SESSION:  PT End of Session - 11/23/22 1154     Visit Number 38    Number of Visits 54    Date for PT Re-Evaluation 01/22/23    Authorization Type HTA    PT Start Time 1151    PT Stop Time 1240    PT Time Calculation (min) 49 min    Equipment Utilized During Treatment Gait belt    Activity Tolerance Patient tolerated treatment well    Behavior During Therapy WFL for tasks assessed/performed            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   HTN (hypertension) 07/19/2015   Hyperlipidemia    Major neurocognitive disorder 01/09/2014   Mild, related to stroke history   Nocturia    Renal insufficiency 06/25/2013   S/P carotid endarterectomy    BILATERAL ICA--  PATENT PER DUPLEX  05-19-2012   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   Right calf   Urinary frequency    Vitamin D deficiency    Past Surgical History:  Procedure Laterality Date   APPENDECTOMY  AS CHILD   CARDIOVASCULAR STRESS TEST  03-27-2012  DR CRENSHAW   LOW RISK LEXISCAN STUDY-- PROBABLE NORMAL PERFUSION AND SOFT TISSUE ATTENUATION/  NO ISCHEMIA/ EF 51%   CAROTID ENDARTERECTOMY Bilateral LEFT  11-12-2008  DR GREG HAYES   RIGHT ICA  2006  ([facility])   CHOLECYSTECTOMY N/A 02/23/2022   Procedure: LAPAROSCOPIC CHOLECYSTECTOMY;  Surgeon: Quentin Ore, MD;   Location: [facility];  Service: General;  Laterality: N/A;   CYSTOSCOPY W/ RETROGRADES Bilateral 06/22/2021   Procedure: CYSTOSCOPY WITH RETROGRADE PYELOGRAM;  Surgeon: Marcine Matar, MD;  Location: [facility];  Service: Urology;  Laterality: Bilateral;   CYSTOSCOPY WITH LITHOLAPAXY N/A 02/26/2013   Procedure: CYSTOSCOPY WITH LITHOLAPAXY;  Surgeon: Marcine Matar, MD;  Location: [facility];  Service: Urology;  Laterality: N/A;   ENDOSCOPIC RETROGRADE CHOLANGIOPANCREATOGRAPHY (ERCP) WITH PROPOFOL N/A 02/22/2022   Procedure: ENDOSCOPIC RETROGRADE CHOLANGIOPANCREATOGRAPHY (ERCP) WITH PROPOFOL;  Surgeon: Jeani Hawking, MD;  Location: [facility] ENDOSCOPY;  Service: Gastroenterology;  Laterality: N/A;   EYE SURGERY  Jan. 2016   cataract surgery both eyes   INGUINAL HERNIA REPAIR Right 11-08-2006   IR KYPHO EA ADDL LEVEL THORACIC OR LUMBAR  02/12/2021   IR RADIOLOGIST EVAL & MGMT  02/18/2021   MASS EXCISION N/A 03/03/2016   Procedure: EXCISION OF BACK  MASS;  Surgeon: Almond Lint, MD;  Location: [facility];  Service: General;  Laterality: N/A;   MOHS SURGERY Left 1/ 2016   Dr Margo Aye-- Basal cell   PROSTATE SURGERY     REMOVAL OF STONES  02/22/2022   Procedure: REMOVAL OF STONES;  Surgeon: Jeani Hawking, MD;  Location: [facility] ENDOSCOPY;  Service: Gastroenterology;;  SPHINCTEROTOMY  02/22/2022   Procedure: SPHINCTEROTOMY;  Surgeon: Jeani Hawking, MD;  Location: [facility] ENDOSCOPY;  Service: Gastroenterology;;   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-C N/A 06/22/2021   Procedure: TRANSURETHRAL RESECTION OF BLADDER TUMOR;  Surgeon: Marcine Matar, MD;  Location: [facility];  Service: Urology;  Laterality: N/A;   TRANSURETHRAL RESECTION OF PROSTATE N/A 02/26/2013   Procedure: TRANSURETHRAL RESECTION OF THE PROSTATE WITH GYRUS INSTRUMENTS;  Surgeon: Marcine Matar, MD;  Location: [facility];  Service: Urology;  Laterality: N/A;    TRANSURETHRAL RESECTION OF PROSTATE N/A 06/22/2021   Procedure: TRANSURETHRAL RESECTION OF THE PROSTATE (TURP);  Surgeon: Marcine Matar, MD;  Location: [facility];  Service: Urology;  Laterality: N/A;   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   Insomnia    Prediabetes    Acute 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 (HCC)    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   Transaminitis 07/27/2021   UTI (urinary tract infection) 07/27/2021   CVA (cerebral vascular accident) (HCC) 07/27/2021   Fall 07/27/2021   Hyperglycemia 07/27/2021   Cholelithiasis 07/27/2021   Hypoxia 07/27/2021   Nausea and vomiting 07/27/2021   Acute metabolic encephalopathy 07/27/2021   Normocytic anemia 07/27/2021   Chronic back pain 07/27/2021   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   Closed fracture of first lumbar vertebra with routine healing 02/03/2021   Closed fracture of multiple ribs 11/18/2020   Anxiety 01/29/2020   Leg pain, bilateral 01/29/2020   Ingrown toenail 07/13/2019   Lumbar spondylosis 05/02/2018   Pain in left knee 03/09/2018   Osteoarthritis of left hip 01/16/2018   Trochanteric bursitis of left hip 01/16/2018   Preventative health care 09/26/2017   HTN (hypertension) 07/19/2015   Hyperlipidemia 07/19/2015   Great toe pain 02/11/2014   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   Obesity (BMI 30-39.9) 06/25/2013   Renal insufficiency 06/25/2013   Weakness of left arm 06/25/2013   Sebaceous cyst 03/03/2011   Sprain of lumbar region 07/31/2010   Rib pain, left 08/29/2009   Carotid artery stenosis, asymptomatic, bilateral 05/02/2009   Eczema, atopic 05/31/2008   Vitamin D deficiency 03/01/2008   BPH (benign prostatic hyperplasia) 08/06/2007   Fasting hyperglycemia 12/21/2006   History of right MCA infarct 2006    ONSET DATE: 06/29/22 REFERRING DIAG:  Diagnosis  I63.9 (ICD-10-CM) - Cerebrovascular accident (CVA), unspecified mechanism (HCC)  W19.XXXD (ICD-10-CM) - Fall, subsequent encounter    THERAPY DIAG:  Muscle weakness (generalized)  Hemiplegia and hemiparesis following cerebral infarction affecting left non-dominant side (HCC)  Repeated falls  Difficulty in walking, not elsewhere classified  Other abnormalities of gait and mobility  Other symptoms and signs involving cognitive functions following cerebral infarction  Rationale for Evaluation and Treatment: Rehabilitation  SUBJECTIVE:  SUBJECTIVE STATEMENT:  The patient has been seen for 35 prior visits at our [facility] location for physical therapy. [facility] contacted due to patient seeing neurologist recently and beginning sinemet for PD. They requested to transfer to more specialized Parkinson's therapist in order to combine parkinson's directed exercise with new medication. Today is our first visit at [facility] clinic. The patient reports a fall on Sunday noting lightheadedness. His wife had to call 9-1-1 to help get William [name] off of the floor. She thinks the lightheadedness is getting worse since starting sinemet-- she has let MD know via MyChart about this incident.  See patient and family goals below.  PERTINENT HISTORY:  ZAVEN CHEATOM is a 82 year old man with dementia, CKD, HTN, CAD, HLD and history of CVA  (2006, 2023), Parkinson's  PAIN:  Are you having pain? No  PRECAUTIONS: Fall  WEIGHT BEARING RESTRICTIONS: No  FALLS: Has patient fallen in last 6 months? No  LIVING ENVIRONMENT: Lives with: lives with their family and lives with their spouse Lives in: House/apartment Stairs:  1 brick high step Has following equipment at home: Environmental consultant - 2 wheeled, Wheelchair (manual), Shower bench, bed side commode, Grab bars, and hospital bed, sliding pad for car  PLOF: Independent with basic ADLs and Independent with household mobility without device  PATIENT GOALS: Walk around the block with his wife. Be able to use the dining room chair rather than have to use the W/C. Increased I with bed mobility and simple tasks at home like make some coffee or brush his teeth, Step over tub to shower, has bench, but he can't really use it. Up and down the one step to get to back deck. 11/23/22: Patient's wife William [name] updated his goals today: Walk around store (gets distracted), swing a golf club again (thinking of driving range), get up on his own, walk without leaning forward, more recognition of the left side (from CVA), not need gait belt, OT-- be able to get dressed more on his own, navigate the newspaper.  OBJECTIVE:  (Measures in this section from initial evaluation unless otherwise noted) DIAGNOSTIC FINDINGS:  MRI 2021 with degenerative changes in lumbar spine, L3-4 R subarticular stenosis COGNITION: Overall cognitive status: History of cognitive impairments - at baseline SENSATION: Not tested COORDINATION: Moderately impaired BLE MUSCLE LENGTH: Hamstrings: severely restricted B Thomas test: Severely restricted B. POSTURE: rounded shoulders, forward head, increased thoracic kyphosis, posterior pelvic tilt, and flexed trunk  LOWER EXTREMITY ROM:   BLE  extremely stiff throughout, limited hip ROM in all planes, B knees limited in extension. LOWER EXTREMITY MMT:  3+/5 throughout. Unable to determine accuracy of MMT due to cognition. Functionally demosntrates poor coordinated activation and poor muscular endurance. BED MOBILITY: Min A for Supine<> Sit. Patient was using a ladder in his hospital bed and could move MI. TRANSFERS: Min A, mod VC, tends to lean back.  11/02/22 independent with use of hands on arm chair with a couple of tries CURB: Min A-Has one step out to his deck. STAIRS: N/A  GAIT: Gait pattern: step to pattern, decreased arm swing- Right, decreased arm swing- Left, decreased step length- Right, decreased step length- Left, shuffling, festinating, trunk flexed, narrow BOS, poor foot clearance- Right, and poor foot clearance- Left Distance walked: 31' Assistive device utilized: Environmental consultant - 2 wheeled Level of assistance: Occasional min A Comments: mod VC to stay close to walker, take longer steps. FUNCTIONAL TESTS:  5 times sit to stand: 38.23  09/29/22  could  not performs 5XSTS, 11/02/22 = 47 seconds with cues to use arms Timed up and go (TUG): 47.91  with RW, 07/21/22 TUG 29  seconds without device, 09/29/22 25 seconds, 11/02/22 TUG 20 seconds  [facility] Adult PT Treatment:                                                DATE: 11/23/22 Neuromuscular re-ed: Standing  Overhead reaching  PWR exercises Seated for PWR up, PWR twist with tactile and demo cues for L hand engagement, PWR weight shifting Standing PWR up activities engaging gluts for upright posture, boomwhackers used to engage bilatera Ues, PWR twist x 3 reps Transitional sit<>stand working on stomp then stand, scooting with tactile cues for knee targets, and sidestepping Gait: Cues for upright posture and longer stride length Self Care: Discussed goals with patient and his wife, William [name]. We discussed repetitive, large amplitude movements as the basis for some of our activities in the  clinic and the focus of finding some activities that can be performed at home. There is a caregiver present 3 days/week x 4 hours. William [name] sleeps in a hospital bed so supine PWR exercises may not be able to be performed due to width of bed and mattress.                                                                         11/18/22 UBE level 4 x 6 minutes Gait outside half the parking [place] [facility] cues for step but this part very minimal issues, small rest break, then outside around to the front of the building, required 3 standing rest breaks and much more cues to do better steps as he was shuffling more Step/turn/reach Seated PWR moves with a lot of cues and some assist for the left UE tot he back and up Stairs 4" and 6" step over step with right hand rail Standing pbars marching and hip abduction Step to cones PT said out the color and he stepped to Talked with wife about the transfer to [place]  11/15/22 Gait outside 1/2 parking [place] and then rest, HHA, then up and back around the building, when he fatigues he starts to shuffle and needs cues to stop and rest and then big steps, at times needs to count with going through doors In [place] step and reach Step toward cone color that PT calls out [place] toss and ball kicks Side stepping Standing hip marches and hip abduction Standing on airex 5# pulleys with left arm and then with right arm On airex reaching  PATIENT EDUCATION: Education details: POC Person educated: Patient and Spouse Education method: Explanation Education comprehension: verbalized understanding  HOME EXERCISE PROGRAM: TBD  GOALS: Goals reviewed with patient? Yes  SHORT TERM GOALS: Target date: 12/22/22  Perform HEP for PWR! Moves with cues from family. Baseline: Pt's wife, William [name], notes walking and hitting beach ball as main HEP. Goal status: REVISED   2. Decrease 5 x STS to < 35 sec to demonstrate improved functional strength  Baseline: 47 seconds on 5/21 per  note  Goal status: REVISED  3. Perform sit <> stand transfers from average chair height with Supervision.  Baseline: CGA to min A  Goal status: REVISED   LONG TERM GOALS: Target date: 01/22/23  Patient will perform HEP progression with caregiver assist. Baseline: has minimal HEP Goal status: REVISED  2.  Decrease 5 x STS to < 25 sec to demonstrate improved functional strength Baseline: 47 seconds on 5/21 per note Goal status: REVISED  3. Improve quality of gait to be able to ambulate x 100 feet with supervision. Baseline: CGA with cues  Goal status: REVISED  4.  Patient will perform his bed mobility with MI including rolling, sit<>supine, and scooting in bed. Baseline: on hard surface mat he is independent but on soft bed at home he struggles, wife reports only needs a hand to get up now 11/02/22 Goal status: REVISED  5.  Patient will demonstrate floor<>stand with CGA and verbal cues for patient's wife to be able to assist. Baseline:  Has to call 9-1-1 Goal status: NEW GOAL  ASSESSMENT:  CLINICAL IMPRESSION:  The patient demonstrated significant improvement within session today. Although it is a renewal date due to transition to new clinic within same system, PT did more functional training at this session in order to determine if the patient could response to large amplitude repetitive tasks. He did improve with sit<>stand from min A in lobby entering into clinic to distant supervision at end of session with sit<>stand. He did also engage using the L UE using auditory cues (let me hear you tap the mat with the left hand) and props of boom whackers and targets for L side. 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 PT writing goals for 8 more weeks, however, will consistently monitor the patient's progress and adjust since he has already completed extensive therapy.   OBJECTIVE IMPAIRMENTS:  Abnormal gait, decreased activity tolerance, decreased balance, decreased cognition, decreased coordination, decreased endurance, decreased mobility, difficulty walking, decreased ROM, decreased strength, decreased safety awareness, impaired flexibility, impaired UE functional use, improper body mechanics, and postural dysfunction.   PLAN:  PT FREQUENCY: 2x/week  PT DURATION: 12 weeks  PLANNED INTERVENTIONS: Therapeutic exercises, Therapeutic activity, Neuromuscular re-education, Balance training, Gait training, Patient/Family education, Self Care, Joint mobilization, Stair training, Moist heat, and Manual therapy  PLAN FOR NEXT SESSION:Establish HEP for PWR moves (sitting? And sit<>stand transitions), give handout.  Gait cues to improve stride and posture. Large amplitude training (may begin in supine and prone in the clinic to potentially engage and load L UE -- if tolerates).    Elija Mccamish, PT 11/23/2022, 1:06 PM  [facility] [address], [address] [place], [place], [address] Phone: [phone]   Fax:  [phone]

## 2022-11-24 ENCOUNTER — Ambulatory Visit: Payer: PPO | Admitting: Physical Therapy

## 2022-11-25 ENCOUNTER — Ambulatory Visit: Payer: PPO | Admitting: Physical Therapy

## 2022-11-25 ENCOUNTER — Encounter: Payer: Self-pay | Admitting: Physical Therapy

## 2022-11-25 DIAGNOSIS — M6281 Muscle weakness (generalized): Secondary | ICD-10-CM

## 2022-11-25 DIAGNOSIS — R2689 Other abnormalities of gait and mobility: Secondary | ICD-10-CM

## 2022-11-25 NOTE — Therapy (Signed)
OUTPATIENT PHYSICAL THERAPY NEURO TREATMENT NOTE    Patient Name: William [name] MRN: [id number] DOB:Mar 27, 1941, 82 y.o., male Today's Date: 11/25/2022  PCP: Donato Schultz  DO REFERRING PROVIDER: same  END OF SESSION:  PT End of Session - 11/25/22 1535     Visit Number [id number]    Number of Visits 54    Date for PT Re-Evaluation 01/22/23    Authorization Type HTA    PT Start Time 1535    PT Stop Time 1620    PT Time Calculation (min) 45 min    Equipment Utilized During Treatment Gait belt    Activity Tolerance Patient tolerated treatment well    Behavior During Therapy WFL for tasks assessed/performed             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   HTN (hypertension) 07/19/2015   Hyperlipidemia    Major neurocognitive disorder 01/09/2014   Mild, related to stroke history   Nocturia    Renal insufficiency 06/25/2013   S/P carotid endarterectomy    BILATERAL ICA--  PATENT PER DUPLEX  05-19-2012   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/26/2017   Right calf   Urinary frequency    Vitamin D deficiency    Past Surgical History:  Procedure Laterality Date   APPENDECTOMY  AS CHILD   CARDIOVASCULAR STRESS TEST  03-27-2012  DR CRENSHAW   LOW RISK LEXISCAN STUDY-- PROBABLE NORMAL PERFUSION AND SOFT TISSUE ATTENUATION/  NO ISCHEMIA/ EF 51%   CAROTID ENDARTERECTOMY Bilateral LEFT  11-12-2008  DR GREG HAYES   RIGHT ICA  2006  ([facility])   CHOLECYSTECTOMY N/A 02/23/2022   Procedure: LAPAROSCOPIC CHOLECYSTECTOMY;  Surgeon: Quentin Ore, MD;  Location:  [facility] OR;  Service: General;  Laterality: N/A;   CYSTOSCOPY W/ RETROGRADES Bilateral 06/22/2021   Procedure: CYSTOSCOPY WITH RETROGRADE PYELOGRAM;  Surgeon: Marcine Matar, MD;  Location: [facility];  Service: Urology;  Laterality: Bilateral;   CYSTOSCOPY WITH LITHOLAPAXY N/A 02/26/2013   Procedure: CYSTOSCOPY WITH LITHOLAPAXY;  Surgeon: Marcine Matar, MD;  Location: [facility];  Service: Urology;  Laterality: N/A;   ENDOSCOPIC RETROGRADE CHOLANGIOPANCREATOGRAPHY (ERCP) WITH PROPOFOL N/A 02/22/2022   Procedure: ENDOSCOPIC RETROGRADE CHOLANGIOPANCREATOGRAPHY (ERCP) WITH PROPOFOL;  Surgeon: Jeani Hawking, MD;  Location: [facility] ENDOSCOPY;  Service: Gastroenterology;  Laterality: N/A;   EYE SURGERY  Jan. 2016   cataract surgery both eyes   INGUINAL HERNIA REPAIR Right 11-08-2006   IR KYPHO EA ADDL LEVEL THORACIC OR LUMBAR  02/12/2021   IR RADIOLOGIST EVAL & MGMT  02/18/2021   MASS EXCISION N/A 03/03/2016   Procedure: EXCISION OF BACK  MASS;  Surgeon: Almond Lint, MD;  Location: [facility];  Service: General;  Laterality: N/A;   MOHS SURGERY Left 1/ 2016   Dr Margo Aye-- Basal cell   PROSTATE SURGERY     REMOVAL OF STONES  02/22/2022   Procedure: REMOVAL OF STONES;  Surgeon: Jeani Hawking, MD;  Location: [facility] ENDOSCOPY;  Service: Gastroenterology;;  SPHINCTEROTOMY  02/22/2022   Procedure: SPHINCTEROTOMY;  Surgeon: Jeani Hawking, MD;  Location: [facility] ENDOSCOPY;  Service: Gastroenterology;;   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-C N/A 06/22/2021   Procedure: TRANSURETHRAL RESECTION OF BLADDER TUMOR;  Surgeon: Marcine Matar, MD;  Location: [facility];  Service: Urology;  Laterality: N/A;   TRANSURETHRAL RESECTION OF PROSTATE N/A 02/26/2013   Procedure: TRANSURETHRAL RESECTION OF THE PROSTATE WITH GYRUS INSTRUMENTS;  Surgeon: Marcine Matar, MD;  Location: [facility];  Service: Urology;  Laterality: N/A;    TRANSURETHRAL RESECTION OF PROSTATE N/A 06/22/2021   Procedure: TRANSURETHRAL RESECTION OF THE PROSTATE (TURP);  Surgeon: Marcine Matar, MD;  Location: [facility];  Service: Urology;  Laterality: N/A;   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   Insomnia    Prediabetes    Acute 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 (HCC)    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   Transaminitis 07/27/2021   UTI (urinary tract infection) 07/27/2021   CVA (cerebral vascular accident) (HCC) 07/27/2021   Fall 07/27/2021   Hyperglycemia 07/27/2021   Cholelithiasis 07/27/2021   Hypoxia 07/27/2021   Nausea and vomiting 07/27/2021   Acute metabolic encephalopathy 07/27/2021   Normocytic anemia 07/27/2021   Chronic back pain 07/27/2021   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   Closed fracture of first lumbar vertebra with routine healing 02/03/2021   Closed fracture of multiple ribs 11/18/2020   Anxiety 01/29/2020   Leg pain, bilateral 01/29/2020   Ingrown toenail 07/13/2019   Lumbar spondylosis 05/02/2018   Pain in left knee 03/09/2018   Osteoarthritis of left hip 01/16/2018   Trochanteric bursitis of left hip 01/16/2018   Preventative health care 09/26/2017   HTN (hypertension) 07/19/2015   Hyperlipidemia 07/19/2015   Great toe pain 02/11/2014   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   Obesity (BMI 30-39.9) 06/25/2013   Renal insufficiency 06/25/2013   Weakness of left arm 06/25/2013   Sebaceous cyst 03/03/2011   Sprain of lumbar region 07/31/2010   Rib pain, left 08/29/2009   Carotid artery stenosis, asymptomatic, bilateral 05/02/2009   Eczema, atopic 05/31/2008   Vitamin D deficiency 03/01/2008   BPH (benign prostatic hyperplasia) 08/06/2007   Fasting hyperglycemia 12/21/2006   History of right MCA infarct 2006    ONSET DATE: 06/29/22 REFERRING DIAG:  Diagnosis  I63.9 (ICD-10-CM) - Cerebrovascular accident (CVA), unspecified mechanism (HCC)  W19.XXXD (ICD-10-CM) - Fall, subsequent encounter    THERAPY DIAG:  Muscle weakness (generalized)  Other abnormalities of gait and mobility  Rationale for Evaluation and Treatment: Rehabilitation  SUBJECTIVE:  SUBJECTIVE STATEMENT:  Today is a good day.  Wife reports she has a long list of goals for patient   1)Store walking 2)Longer walking down the street 3)Bob would love to swing a golf club 4)To get up and down from a chair 5)walking without forward lean 6)More recognition of the left side 7)Dress himself with less assistance, especially difficult with lower body dressing 8)Would like to negotiate the paper better in the morning 9)Bob would like to not use the gait belt. 10)Would like to get up from the floor after a fall.   Wife is concerned about dinners out, pt being able to complete a meal in timely fashion.  PERTINENT HISTORY:  William [name] is a 82 year old man with dementia, CKD, HTN, CAD, HLD and history of CVA  (2006, 2023), Parkinson's  PAIN:  Are you having pain? No  PRECAUTIONS: Fall  WEIGHT BEARING RESTRICTIONS: No  FALLS: Has patient fallen in last 6 months?  No  LIVING ENVIRONMENT: Lives with: lives with their family and lives with their spouse Lives in: House/apartment Stairs:  1 brick high step Has following equipment at home: Environmental consultant - 2 wheeled, Wheelchair (manual), Shower bench, bed side commode, Grab bars, and hospital bed, sliding pad for car  PLOF: Independent with basic ADLs and Independent with household mobility without device  PATIENT GOALS: Walk around the block with his wife. Be able to use the dining room chair rather than have to use the W/C. Increased I with bed mobility and simple tasks at home like make some coffee or brush his teeth, Step over tub to shower, has bench, but he can't really use it. Up and down the one step to get to back deck. 11/23/22: Patient's wife [name] updated his goals today: Walk around store (gets distracted), swing a golf club again (thinking of driving range), get up on his own, walk without leaning forward, more recognition of the left side (from CVA), not need gait belt, OT-- be able to get dressed more on his own, navigate the newspaper.  OBJECTIVE:  (Measures in this section from initial evaluation unless otherwise noted) DIAGNOSTIC FINDINGS:  MRI 2021 with degenerative changes in lumbar spine, L3-4 R subarticular stenosis COGNITION: Overall cognitive status: History of cognitive impairments - at baseline SENSATION: Not tested COORDINATION: Moderately impaired BLE MUSCLE LENGTH: Hamstrings: severely restricted B Thomas test: Severely restricted B. POSTURE: rounded shoulders, forward head, increased thoracic kyphosis, posterior pelvic tilt, and flexed trunk  LOWER EXTREMITY ROM:   BLE extremely stiff throughout, limited hip ROM in all planes, B knees limited in extension. LOWER EXTREMITY MMT:  3+/5 throughout. Unable to determine accuracy of MMT due to cognition. Functionally demosntrates poor coordinated activation and poor muscular endurance. BED MOBILITY: Min A for Supine<> Sit. Patient was  using a ladder in his hospital bed and could move MI. TRANSFERS: Min A, mod VC, tends to lean back.  11/02/22 independent with use of hands on arm chair with a couple of tries CURB: Min A-Has one step out to his deck. STAIRS: N/A  GAIT: Gait pattern: step to pattern, decreased arm swing- Right, decreased arm swing- Left, decreased step length- Right, decreased step length- Left, shuffling, festinating, trunk flexed, narrow BOS, poor foot clearance- Right, and poor foot clearance- Left Distance walked: 45' Assistive device utilized: Environmental consultant - 2 wheeled Level of assistance: Occasional min A Comments: mod VC to stay close to walker, take longer steps. FUNCTIONAL TESTS:  5 times sit to stand: 38.23  09/29/22  could not performs 5XSTS, 11/02/22 = 47 seconds with cues to use arms Timed up and go (TUG): 47.91  with RW, 07/21/22 TUG 29  seconds without device, 09/29/22 25 seconds, 11/02/22 TUG 20 seconds    [facility] Adult PT Treatment:                                                DATE: 11/25/22 Neuromuscular re-ed: Standing PWR! Up at counter, x 5 reps with [employer]es staying at counter support PWR! Rock with added reach, tactile and verbal cues for wide BOS and weightshifting Forward step to targets, return to midline Seated PWR exercises Seated PWR up with [employer] and tactile cues/assist for upright posture;  [employer] with with tactile and demo cues for L hand engagement, PWR Step, then step and reach with tactile cues and demo Seated forward lean to upright posture (Modified PWR! Up) with 5 second hold upright posture Transitional sit<>stand working on forward lean, then upright posture looking at target upon standing Gait: Gait 50 ft x 4 reps with cues for upright posture and longer stride length; pt fatigued on last 2 reps of gait with more shuffling pattern; cues to stop and reset posture; min guard at shoulders and gait belt Self Care: Discussed/reviewed goals with patient and his wife, [name].  Discussed and reviewed use of repetitive, large amplitude movements as the basis for some of our activities in the clinic and the focus of finding some activities that can be performed at home.                                                                           PATIENT EDUCATION: Education details: POC, see above Person educated: Patient and Spouse Education method: Explanation Education comprehension: verbalized understanding  HOME EXERCISE PROGRAM: TBD  GOALS: Goals reviewed with patient? Yes  SHORT TERM GOALS: Target date: 12/22/22  Perform HEP for PWR! Moves with cues from family. Baseline: Pt's wife, [name], notes walking and hitting beach ball as main HEP. Goal status: REVISED   2. Decrease 5 x STS to < 35 sec to demonstrate improved functional strength  Baseline: 47 seconds on 5/21 per note  Goal status: REVISED   3. Perform sit <> stand transfers from average chair height with Supervision.  Baseline: CGA to min A  Goal status: REVISED   LONG TERM GOALS: Target date: 01/22/23  Patient will perform HEP progression with caregiver assist. Baseline: has minimal HEP Goal status: REVISED  2.  Decrease 5 x STS to < 25 sec to demonstrate improved functional strength Baseline: 47 seconds on 5/21 per note Goal status: REVISED  3. Improve quality of gait to be able to ambulate x 100 feet with supervision. Baseline: CGA with cues  Goal status: REVISED  4.  Patient will perform his bed mobility with MI including rolling, sit<>supine, and scooting in bed. Baseline: on hard surface mat he is independent but on soft bed at home he struggles, wife reports only needs a hand to get up now 11/02/22 Goal status: REVISED  5.  Patient will demonstrate  floor<>stand with CGA and verbal cues for patient's wife to be able to assist. Baseline:  Has to call 9-1-1 Goal status: NEW GOAL  ASSESSMENT:  CLINICAL IMPRESSION:  Skilled PT session today with focus on posture and  weightshifting, to work to improve transfers and gait, using large amplitude movement patterns.  Multi-modal cues provided to engage L side with pt doing well with LLE for first several reps, then begins to hasten movement patterns.  With sit<>stand and with gait with use of visual target, pt does a good job with improved posture.  He does note to slow gait pattern and have more shuffling steps after about 100 ft of gait.  Did not formally add PWR! Moves to HEP today, as he would need additional practice and likely some modifications to pictures/handouts, but wife/caregiver will be present to assist.  He will continue to benefit from skilled PT to further improve functional mobility, transfers, gait for decreased fall risk.    OBJECTIVE IMPAIRMENTS: Abnormal gait, decreased activity tolerance, decreased balance, decreased cognition, decreased coordination, decreased endurance, decreased mobility, difficulty walking, decreased ROM, decreased strength, decreased safety awareness, impaired flexibility, impaired UE functional use, improper body mechanics, and postural dysfunction.   PLAN:  PT FREQUENCY: 2x/week  PT DURATION: 12 weeks  PLANNED INTERVENTIONS: Therapeutic exercises, Therapeutic activity, Neuromuscular re-education, Balance training, Gait training, Patient/Family education, Self Care, Joint mobilization, Stair training, Moist heat, and Manual therapy  PLAN FOR NEXT SESSION: Continue to work on [employer] moves sitting, standing and try to add to HEP.  Work on  sit<>stand transitions. Use mirror for visual feedback Gait cues to improve stride and posture. Large amplitude training (may begin in supine and prone in the clinic to potentially engage and load L UE -- if tolerates).    Ramona Ruark W., PT 11/25/2022, 5:52 PM  [facility] [address] [place], [place], [address] Phone: [phone]   Fax:  [phone]

## 2022-11-26 ENCOUNTER — Other Ambulatory Visit (HOSPITAL_BASED_OUTPATIENT_CLINIC_OR_DEPARTMENT_OTHER): Payer: Self-pay

## 2022-11-29 ENCOUNTER — Ambulatory Visit: Payer: PPO | Admitting: Physical Therapy

## 2022-11-29 ENCOUNTER — Other Ambulatory Visit: Payer: Self-pay

## 2022-11-29 ENCOUNTER — Other Ambulatory Visit (HOSPITAL_BASED_OUTPATIENT_CLINIC_OR_DEPARTMENT_OTHER): Payer: Self-pay

## 2022-11-30 ENCOUNTER — Encounter: Payer: Self-pay | Admitting: Physical Therapy

## 2022-11-30 ENCOUNTER — Ambulatory Visit: Payer: PPO | Admitting: Physical Therapy

## 2022-11-30 ENCOUNTER — Ambulatory Visit: Payer: PPO | Admitting: Occupational Therapy

## 2022-11-30 DIAGNOSIS — M6281 Muscle weakness (generalized): Secondary | ICD-10-CM

## 2022-11-30 DIAGNOSIS — R41840 Attention and concentration deficit: Secondary | ICD-10-CM

## 2022-11-30 DIAGNOSIS — I69354 Hemiplegia and hemiparesis following cerebral infarction affecting left non-dominant side: Secondary | ICD-10-CM

## 2022-11-30 DIAGNOSIS — I69318 Other symptoms and signs involving cognitive functions following cerebral infarction: Secondary | ICD-10-CM

## 2022-11-30 DIAGNOSIS — R2689 Other abnormalities of gait and mobility: Secondary | ICD-10-CM

## 2022-11-30 DIAGNOSIS — R278 Other lack of coordination: Secondary | ICD-10-CM

## 2022-11-30 NOTE — Therapy (Signed)
OUTPATIENT OCCUPATIONAL THERAPY TREATMENT NOTE  Patient Name: William [name] MRN: [id number] DOB:1941-05-20, 82 y.o., male Today's Date: 11/30/2022  PCP & REFERRING PROVIDER:  Zola [name], William Congress, William [name]    END OF SESSION:  OT End of Session - 11/30/22 1433     Visit Number 31    Number of Visits 47    Date for OT Re-Evaluation 01/25/23    Authorization Type [employer]    Authorization Time Period Needs KX    Progress Note Due on Visit 40    OT Start Time 1320    OT Stop Time 1400    OT Time Calculation (min) 40 min    Activity Tolerance Patient tolerated treatment well    Behavior During Therapy WFL for tasks assessed/performed                               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   HTN (hypertension) 07/19/2015   Hyperlipidemia    Major neurocognitive disorder 01/09/2014   Mild, related to stroke history   Nocturia    Renal insufficiency 06/25/2013   S/P carotid endarterectomy    BILATERAL ICA--  PATENT PER DUPLEX  05-19-2012   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   Right calf   Urinary frequency    Vitamin D deficiency    Past Surgical History:  Procedure Laterality Date   APPENDECTOMY  AS CHILD   CARDIOVASCULAR STRESS TEST  03-27-2012  DR CRENSHAW   LOW RISK LEXISCAN STUDY-- PROBABLE NORMAL PERFUSION AND SOFT TISSUE ATTENUATION/  NO ISCHEMIA/ EF 51%   CAROTID ENDARTERECTOMY Bilateral LEFT  11-12-2008  DR GREG HAYES   RIGHT ICA  2006  ([facility])   CHOLECYSTECTOMY N/A 02/23/2022    Procedure: LAPAROSCOPIC CHOLECYSTECTOMY;  Surgeon: Quentin Ore, MD;  Location: [facility] OR;  Service: General;  Laterality: N/A;   CYSTOSCOPY W/ RETROGRADES Bilateral 06/22/2021   Procedure: CYSTOSCOPY WITH RETROGRADE PYELOGRAM;  Surgeon: Marcine Matar, MD;  Location: [facility];  Service: Urology;  Laterality: Bilateral;   CYSTOSCOPY WITH LITHOLAPAXY N/A 02/26/2013   Procedure: CYSTOSCOPY WITH LITHOLAPAXY;  Surgeon: Marcine Matar, MD;  Location: [facility];  Service: Urology;  Laterality: N/A;   ENDOSCOPIC RETROGRADE CHOLANGIOPANCREATOGRAPHY (ERCP) WITH PROPOFOL N/A 02/22/2022   Procedure: ENDOSCOPIC RETROGRADE CHOLANGIOPANCREATOGRAPHY (ERCP) WITH PROPOFOL;  Surgeon: Jeani Hawking, MD;  Location: [facility] ENDOSCOPY;  Service: Gastroenterology;  Laterality: N/A;   EYE SURGERY  Jan. 2016   cataract surgery both eyes   INGUINAL HERNIA REPAIR Right 11-08-2006   IR KYPHO EA ADDL LEVEL THORACIC OR LUMBAR  02/12/2021   IR RADIOLOGIST EVAL & MGMT  02/18/2021   MASS EXCISION N/A 03/03/2016   Procedure: EXCISION OF BACK  MASS;  Surgeon: Almond Lint, MD;  Location: [facility];  Service: General;  Laterality: N/A;   MOHS SURGERY Left 1/ 2016   Dr Margo Aye-- Basal cell  PROSTATE SURGERY     REMOVAL OF STONES  02/22/2022   Procedure: REMOVAL OF STONES;  Surgeon: Jeani Hawking, MD;  Location: [facility] ENDOSCOPY;  Service: Gastroenterology;;   Dennison Mascot  02/22/2022   Procedure: Dennison Mascot;  Surgeon: Jeani Hawking, MD;  Location: [facility] ENDOSCOPY;  Service: Gastroenterology;;   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-C N/A 06/22/2021   Procedure: TRANSURETHRAL RESECTION OF BLADDER TUMOR;  Surgeon: Marcine Matar, MD;  Location: [facility];  Service: Urology;  Laterality: N/A;   TRANSURETHRAL RESECTION OF PROSTATE N/A 02/26/2013   Procedure: TRANSURETHRAL RESECTION OF THE PROSTATE WITH GYRUS INSTRUMENTS;  Surgeon: Marcine Matar, MD;   Location: [facility];  Service: Urology;  Laterality: N/A;   TRANSURETHRAL RESECTION OF PROSTATE N/A 06/22/2021   Procedure: TRANSURETHRAL RESECTION OF THE PROSTATE (TURP);  Surgeon: Marcine Matar, MD;  Location: [facility];  Service: Urology;  Laterality: N/A;   Patient Active Problem List   Diagnosis Date Noted   Dysuria 08/03/2022   Nonintractable headache 07/01/2022   Bilateral impacted cerumen 06/24/2022   Rash 06/15/2022   Postoperative ileus (HCC) 02/27/2022   Ileus, postoperative (HCC) 02/26/2022   Choledocholithiasis 02/19/2022   DNR (William [name] not resuscitate) 02/19/2022   Left elbow pain 01/26/2022   Mid back pain on left side 01/26/2022   Rib pain 01/26/2022   Acute pain of left shoulder 11/12/2021   Leukocytes in urine 11/12/2021   Urinary frequency 11/12/2021   Thrush 10/08/2021   Hemiplegia, 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   Insomnia    Prediabetes    Acute 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 (HCC)    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   Transaminitis 07/27/2021   UTI (urinary tract infection) 07/27/2021   CVA (cerebral vascular accident) (HCC) 07/27/2021   Fall 07/27/2021   Hyperglycemia 07/27/2021   Cholelithiasis 07/27/2021   Hypoxia 07/27/2021   Nausea and vomiting 07/27/2021   Acute metabolic encephalopathy 07/27/2021   Normocytic anemia 07/27/2021   Chronic back pain 07/27/2021   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   Closed fracture of first lumbar vertebra with routine healing 02/03/2021   Closed fracture of multiple ribs 11/18/2020   Anxiety 01/29/2020   Leg pain, bilateral 01/29/2020   Ingrown toenail 07/13/2019   Lumbar spondylosis 05/02/2018   Pain in left knee 03/09/2018   Osteoarthritis of left hip 01/16/2018   Trochanteric bursitis of left hip 01/16/2018   Preventative health care 09/26/2017   HTN (hypertension)  07/19/2015   Hyperlipidemia 07/19/2015   Great toe pain 02/11/2014   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   Obesity (BMI 30-39.9) 06/25/2013   Renal insufficiency 06/25/2013   Weakness of left arm 06/25/2013   Sebaceous cyst 03/03/2011   Sprain of lumbar region 07/31/2010   Rib pain, left 08/29/2009   Carotid artery stenosis, asymptomatic, bilateral 05/02/2009   Eczema, atopic 05/31/2008   Vitamin D deficiency 03/01/2008   BPH (benign prostatic hyperplasia) 08/06/2007   Fasting hyperglycemia 12/21/2006   History of right MCA infarct 2006    ONSET DATE: Acute exacerbation of chronic symptoms (most recent hospitalization 02/26/22 - 03/12/22, followed by SNF and home health)   REFERRING DIAG:  I63.9 (ICD-10-CM) - Cerebrovascular accident (CVA), unspecified mechanism (HCC)  W19.XXXD (ICD-10-CM) - Fall, subsequent encounter    THERAPY DIAG:  Muscle weakness (generalized)  Other abnormalities of gait and mobility  Hemiplegia and hemiparesis following cerebral infarction affecting left non-dominant side (HCC)  Other symptoms and signs involving cognitive functions following cerebral infarction  Other lack of coordination  Attention and concentration deficit  Rationale for Evaluation and Treatment: Rehabilitation  PERTINENT HISTORY: PMHx includes L basal ganglia and corona radiata infarct 07/27/21, L lower homonymous quadrantanopia; R MCA infarct in 2006 w/ L-sided hemiparesis, NCD, carotid artery disease (bilateral), HTN, HLD,CKD3, vascular dementia, and hx of bladder cancer. He has had falls in 2023, fracturing his clavicle in August, then he had stomach surgery in Sept 2023.  He needs assist at baseline for ADLs. When last documented in acute rehab after having his gall-bladder removed, (03/09/22) he required moderate to maximum assist x2 for most ADLs (more assist for ADLs the require standing), and they recommended rehab in skilled nursing setting. He apparently got  Covid at some point and also attended a SNF. Once he D/C home, he had some episodes of combativeness at night  with "sun-downing."  He is communicative and appropriate, though appears easily confused and distracted, not fully oriented to time and place (Ax2, states date is 82 and cannot say what city he lives in). His wife provides most details, and he is participative, though a poor historian at times. In a nut shell- he has been suffering from dementia and post-stroke effects for years, and his issues were recently exacerbated in Sept of last year after abdominal surgery. He has less use of Lt "stroke side" and new, ritualistic "ticks" in mouth and Rt hand/arm.  Wife states that they William [name] have a caregiver come M, W, F for 4 hours in the middle of the day to mainly help him bathe and dress, which usually tires him out. He has been more combative towards the end of the day as well.   PRECAUTIONS: Fall and Other: Lt hemi, poor cognition, LATEX and adhesive allergies, etc.  ;WEIGHT BEARING RESTRICTIONS: No   SUBJECTIVE:   SUBJECTIVE STATEMENT: Pt's spouse reports that he has been on Sinemet about 18 days but has been on full dose about 3 days now.  Spouse reports increase in falling backwards when attempting to get up from toilet, shower chair since starting Sinemet. Pt accompanied by:  his wife   PAIN: no   FALLS: Has patient fallen in last 6 months? Yes. Number of falls 1 in last couple weeks and 2-3 near falls (backwards) when getting up from various height chairs   LIVING ENVIRONMENT: Lives with: lives with their spouse Lives in: House/apartment Has following equipment at home: manual w/c, 2 w/w, weighted utensils, shower bench (for tub), bed cane, hospital bed, raised toilet, and possibly more  PLOF: Needs assistance with ADLs, Needs assistance with homemaking, Needs assistance with gait, and Needs assistance with transfers  PATIENT GOALS: 11/30/22: to return to playing golf, increased  attention (less distraction) to task to engage in ADLs and leisure pursuits, increase Lt arm usage   OBJECTIVE: (All objective assessments below are from initial evaluation on: 07/07/22 unless otherwise specified.)   HAND DOMINANCE: Right  ADLs: Overall ADLs: Wife states decreased ability since hospitalization in Sept 23.  Transfers/ambulation related to ADLs: Min A transfer, CGA for mobility Eating: set up Grooming: Mod A- needs assist with oral care, shaving, but can comb his hair UB Dressing: Min A  LB Dressing: Dep  Toileting:  Mod  Bathing: Mod  Tub Shower transfers: Min A  Equipment: Emergency planning/management officer, Grab bars, and Feeding equipment  IADLs: completely dependent for years now, except some ability to use microwave which is now not  possible after hospitalization   MOBILITY STATUS: Needs Assist: CGA - Min A, Hx of falls, Festination, difficulty with turns, and neglect on Lt side  POSTURE COMMENTS:  rounded shoulders, forward head, and weight shift left  ACTIVITY TOLERANCE: Activity tolerance: he became tired and somewhat agitated by the end of the OT evaluation today (after PT and OT about 2.5hours with mostly sedate activities in seated positions)  FUNCTIONAL OUTCOME MEASURES: [facility] 17/30  showing problems with visuospatial/executive function, attention, fluency, and orientation. (at least 26/30 is needed to be considered "normal")  Trinna Balloon in ADLs: 1/6  (very dependent) 11/30/22: PPT#2: 21.79 sec  UPPER EXTREMITY ROM:  from initial eval  Active ROM Right eval Left eval Left 11/30/22  Shoulder scaption 100 85 85  Shoulder abduction   80  Shoulder adduction     Shoulder extension     Shoulder internal rotation Reaches small of back Reaches to hip Reaches to hip  Shoulder external rotation Reaches back of head  Reaches up to ear Reaches up to ear  Elbow flexion 150 130   Elbow extension (-10*)  (-35*)   Wrist flexion 35 20   Wrist extension 50 10   (Blank  rows = not tested)  UPPER EXTREMITY MMT:    Tested grossly as "push" and "pull": UE push 4-/5, UE pull 4/5  HAND FUNCTION: Grip strength: Right: 30 lbs; Left: 20 lbs  COORDINATION: Finger Nose Finger test: mildly ataxic but fairly accurate  Box and Blocks:  Right 11blocks, Left 5blocks  MUSCLE TONE:  Rt- normal, Lt- decreased and worsened by inattention/disuse  COGNITION: Overall cognitive status: Impaired and Difficulty to assess due to: severity of deficits; Poor attention, fluency, orientation, and executive function. Recall is fairly good as well as abstraction.  Seems to have personality changes due to vascular dementia- like an OCD type volitional perseveration of tapping with Rt hand that he can somewhat control.  He did become angry and agitated at the end of the session performing car transfer with spouse: he did shout at her.  VISION: Subjective report: no new changes per pt/spouse, uses "cheaters"   VISION ASSESSMENT: Visual fields seem intact though he had latent responses on the left side showing likely inattention/and/neglect, tracking somewhat delayed horizontally and vertically  PERCEPTION: Impaired: Inattention/neglect: does not attend to left visual field and does not attend to left side of body, Body scheme: poor quality of recognition with shaving, etc., and Spatial orientation: decreased in print and in orientation, especially to Lt side  PRAXIS: Not tested specifically but no overt/stated issues using pen, wearing glasses ,etc.   OBSERVATIONS: Pt taps and flicks and does seem to volitionally perseverate on making a rhythm/noise with Rt hand. He states he is aware of it and chooses to William [name] it.  He does also seem to have mild resting and intention tremor.    TODAY'S TREATMENT:     DATE: 11/30/22 Discussion in regards to pt/spouse goals to warrant continued occupational therapy services.  Since starting sinemet, pt with decreased tremors but now with increased  episodes of losing balance backwards especially when getting up from toilet/shower chair. Self-feeding: engaged in simulated self-feeding with pt demonstrating good motor control and no tremors or repetitive tapping during task.  Completed PPT #2: 21.79 seconds.  Discussed attention component to task as pt requiring mod-max cues to attend to task and to recall instructions to scoop one bean at a time. UE ROM: OT providing PROM to L shoulder with focus on increased  shoulder flexion as well as attempts at scapular retraction.  Pt tolerating ~95* PROM shoulder flexion but limited in shoulder girdle with attempts at PROM and AROM with scapular retraction. *** finish treatment session and then write new goals, POC, and plan for next session.  Send re-cert to MD.   11/16/22- Therapist checked progress towards goals and discussed with pt/ wife.  Discussion regarding plans to discharge vs. continue with occupational therapy. Pt has started sinemet, and so pt's wife would like to continue OT at [facility]. Discussion with pt/ wife regarding use of  PWR! Up with sit to stand and weight shifts with other freezing episodes. Pt's wife reports freezing episodes in the BR. Pt. flipped cards and picked up penning and quarters off of shelf liner with mod v.c and min-mod difficulty today using LUE. This is an improvement in LUE and attention to left side. Closed chain shoulder flexion in front and reaching for the floor, min-mod v.c         PATIENT EDUCATION: Education details:  progress towards goals and plans for transition to [facility]- neuro,  Coordination tasks with  flipping cards and picking up coins for LUE, closed chain shoulder flexion with dowel Person educated: Patient and Spouse Education method: Explanation, Demonstration, Tactile cues, and Verbal cues, handout Education comprehension: Explanation, demonstration, v.c  HOME EXERCISE PROGRAM:  11/17/22- flipping cards, picking up coins, closed chain shoulder flexion  with dowel     GOALS: Goals reviewed with patient? Yes   SHORT TERM GOALS: (STG required if POC>30 days) Target Date: 09/23/22   Pt will demo/state understanding of initial HEP (or home activity suggestions to maintain function as appropriate) to improve functional skills. Goal status: 07/28/22- Met  2. Pt will donn a t-shirt consistently with min A and v.c   Goal status: met , per wife's report 3. Pt will demonstrate ability to open the newspaper  with supervision min v.c Goal status:  not met, not consistent 11/16/22 4.  Pt will consistently sustain focus to a simple functional task  in a minimally distracting environment x 6 mins with no more than 1 re-direct(ie: eating, functional task in clinic)  Goal status: partially met, pt demonstrates ability to perform in clinic when engaged in a task that is meaningful to him 10/26/22  5. Pt will consistently open containers with only supervision and min v.c           Goal status: met, Pt is performing at home per wife's report 11/03/22  LONG TERM GOALS: Target Date: 11/16/22  Pt will improve functional ability by decreased impairment per Trinna Balloon assessment from 1/6 to 2/6 or better, for better quality of life. Goal status:  not met 1/6  2.  Caregiver will state his grooming ability/hygiene ability improved from moderate assist to supervision assist. Goal status: not met-Pt continues to require mod A.  3.  He will show better functional use of left arm by improved box and blocks Test score from 5 blocks to at least 10 blocks. Goal status:not met but improved 8 blocks  4.  Pt will show ability to microwave instant coffee or small meal safely, to help decrease caregiver burden. Goal status: partially met, pt is able to microwave coffee with supervision, however occasional min A to open containers 5.  He will show improved cognitive ability enough to play a simple game with OT and caregiver. Goal status: approximating goal, pt has  played a game of uno at home with family.  6.  Caregiver will state at least 4 ways to decrease perseveration with right arm, also stating that this behaviors at least somewhat better in the home now. Goal status: deferred perseverative behaviors have improved.  7. Pt will donn a flannel [name] front shirt consistently with min A and min v.c Baseline: mod-max Goal status:  not met mod A and mod v.c  8.  Pt will use his LUE as an active assist for ADLS grossly 40% of the time with min v.c Baseline: uses 25% Goal status:  not met, uses 25 % 11/16/22  9 Pt will stand at the countertop and perform functional activities  incorporating bilateral UE's with supervision and min v.c  Goal status:  not met, inconsistent use of LUE,  10. Pt will back up to a chair and sit down squarely at least 1/3 of the time with min v.c  Goal status: not met inconsistent 11/16/22 11..  Pt will open the newspaper and locate sports section with no more than min v.c                          Goal status:   not met, mod A 11/16/22  ASSESSMENT:  CLINICAL IMPRESSION: For the reporting period of 08/20/22- 11/16/22, pt progress was limited by severity of deficits and L neglect. Pt has recently started Sinemet and he demonstrated improved attention, movement and LUE functional use today. Pt is transferring to [facility]- neuro site. Pt goals were checked today and occupational therapist at that site will establish new goals with pt and wife.  OT Frequency/DURATION: 2x week x 8 weeks   PLANNED INTERVENTIONS: self care/ADL training, therapeutic exercise, therapeutic activity, neuromuscular re-education, manual therapy, balance training, functional mobility training, moist heat, cryotherapy, patient/family education, cognitive remediation/compensation, visual/perceptual remediation/compensation, psychosocial skills training, energy conservation, coping strategies training, DME and/or AE instructions, and Re-evaluation  RECOMMENDED OTHER  SERVICES: He has already seen PT for balance and functional mobility, he would probably benefit from speech for cognitive issues as well, though OT will be working with cognition somewhat functionally now.  CONSULTED AND AGREED WITH PLAN OF CARE: Patient and family member/caregiver  PLAN FOR NEXT SESSION: Rosalio Loud, OTR/L 2:35 PM 11/30/22

## 2022-11-30 NOTE — Therapy (Signed)
OUTPATIENT PHYSICAL THERAPY NEURO TREATMENT NOTE/PROGRESS NOTE   Patient Name: William [name] MRN: [id number] DOB:[birth year], 82 y.o., male Today's Date: 11/30/2022  PCP: Donato Schultz  DO REFERRING PROVIDER: same  Progress Note Reporting Period 10/21/2022 to 11/30/2022  See note below for Objective Data and Assessment of Progress/Goals.      END OF SESSION:  PT End of Session - 11/30/22 1228     Visit Number 40    Number of Visits 54    Date for PT Re-Evaluation 01/22/23    Authorization Type HTA    Progress Note Due on Visit 50    PT Start Time 1232    PT Stop Time 1316    PT Time Calculation (min) 44 min    Equipment Utilized During Treatment Gait belt    Activity Tolerance Patient tolerated treatment well    Behavior During Therapy WFL for tasks assessed/performed              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   HTN (hypertension) 07/19/2015   Hyperlipidemia    Major neurocognitive disorder 01/09/2014   Mild, related to stroke history   Nocturia    Renal insufficiency 06/25/2013   S/P carotid endarterectomy    BILATERAL ICA--  PATENT PER DUPLEX  05-19-2012   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   Right calf   Urinary frequency    Vitamin D deficiency    Past Surgical History:  Procedure Laterality Date   APPENDECTOMY  AS CHILD   CARDIOVASCULAR STRESS TEST  03-27-2012  DR CRENSHAW   LOW RISK LEXISCAN STUDY-- PROBABLE NORMAL PERFUSION AND SOFT TISSUE ATTENUATION/  NO ISCHEMIA/ EF 51%   CAROTID ENDARTERECTOMY Bilateral  LEFT  11-12-2008  DR GREG HAYES   RIGHT ICA  2006  ([facility])   CHOLECYSTECTOMY N/A 02/23/2022   Procedure: LAPAROSCOPIC CHOLECYSTECTOMY;  Surgeon: Quentin Ore, MD;  Location: [facility] OR;  Service: General;  Laterality: N/A;   CYSTOSCOPY W/ RETROGRADES Bilateral 06/22/2021   Procedure: CYSTOSCOPY WITH RETROGRADE PYELOGRAM;  Surgeon: Marcine Matar, MD;  Location: [facility];  Service: Urology;  Laterality: Bilateral;   CYSTOSCOPY WITH LITHOLAPAXY N/A 02/26/2013   Procedure: CYSTOSCOPY WITH LITHOLAPAXY;  Surgeon: Marcine Matar, MD;  Location: [facility];  Service: Urology;  Laterality: N/A;   ENDOSCOPIC RETROGRADE CHOLANGIOPANCREATOGRAPHY (ERCP) WITH PROPOFOL N/A 02/22/2022   Procedure: ENDOSCOPIC RETROGRADE CHOLANGIOPANCREATOGRAPHY (ERCP) WITH PROPOFOL;  Surgeon: Jeani Hawking, MD;  Location: [facility] ENDOSCOPY;  Service: Gastroenterology;  Laterality: N/A;   EYE SURGERY  Jan. 2016   cataract surgery both eyes   INGUINAL HERNIA REPAIR Right 11-08-2006   IR KYPHO EA ADDL LEVEL THORACIC OR LUMBAR  02/12/2021   IR RADIOLOGIST EVAL & MGMT  02/18/2021   MASS EXCISION N/A 03/03/2016   Procedure: EXCISION OF BACK  MASS;  Surgeon: Almond Lint, MD;  Location: [facility];  Service: General;  Laterality: N/A;   MOHS SURGERY Left 1/ 2016   Dr Margo Aye-- Basal  cell   PROSTATE SURGERY     REMOVAL OF STONES  02/22/2022   Procedure: REMOVAL OF STONES;  Surgeon: Jeani Hawking, MD;  Location: [facility] ENDOSCOPY;  Service: Gastroenterology;;   Dennison Mascot  02/22/2022   Procedure: Dennison Mascot;  Surgeon: Jeani Hawking, MD;  Location: [facility] ENDOSCOPY;  Service: Gastroenterology;;   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-C N/A 06/22/2021   Procedure: TRANSURETHRAL RESECTION OF BLADDER TUMOR;  Surgeon: Marcine Matar, MD;  Location: [facility];  Service: Urology;  Laterality: N/A;   TRANSURETHRAL RESECTION OF PROSTATE N/A 02/26/2013   Procedure:  TRANSURETHRAL RESECTION OF THE PROSTATE WITH GYRUS INSTRUMENTS;  Surgeon: Marcine Matar, MD;  Location: [facility];  Service: Urology;  Laterality: N/A;   TRANSURETHRAL RESECTION OF PROSTATE N/A 06/22/2021   Procedure: TRANSURETHRAL RESECTION OF THE PROSTATE (TURP);  Surgeon: Marcine Matar, MD;  Location: [facility];  Service: Urology;  Laterality: N/A;   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   Insomnia    Prediabetes    Acute 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 (HCC)    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   Transaminitis 07/27/2021   UTI (urinary tract infection) 07/27/2021   CVA (cerebral vascular accident) (HCC) 07/27/2021   Fall 07/27/2021   Hyperglycemia 07/27/2021   Cholelithiasis 07/27/2021   Hypoxia 07/27/2021   Nausea and vomiting 07/27/2021   Acute metabolic encephalopathy 07/27/2021   Normocytic anemia 07/27/2021   Chronic back pain 07/27/2021   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   Closed fracture of first lumbar vertebra with routine healing 02/03/2021   Closed fracture of multiple ribs 11/18/2020   Anxiety 01/29/2020   Leg pain, bilateral 01/29/2020   Ingrown toenail 07/13/2019   Lumbar spondylosis 05/02/2018   Pain in left knee 03/09/2018   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    Trochanteric bursitis of left hip 01/16/2018   Preventative health care 09/26/2017   HTN (hypertension) 07/19/2015   Hyperlipidemia 07/19/2015   Great toe pain 02/11/2014   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   Obesity (BMI 30-39.9) 06/25/2013   Renal insufficiency 06/25/2013   Weakness of left arm 06/25/2013   Sebaceous cyst 03/03/2011   Sprain of lumbar region 07/31/2010   Rib pain, left 08/29/2009   Carotid artery stenosis, asymptomatic, bilateral 05/02/2009   Eczema, atopic 05/31/2008   Vitamin D deficiency 03/01/2008   BPH (benign prostatic hyperplasia) 08/06/2007   Fasting hyperglycemia 12/21/2006   History of right MCA infarct 2006    ONSET DATE: 06/29/22 REFERRING DIAG:  Diagnosis  I63.9 (ICD-10-CM) - Cerebrovascular accident (CVA), unspecified mechanism (HCC)  W19.XXXD (ICD-10-CM) - Fall, subsequent encounter    THERAPY DIAG:  Other abnormalities of gait and mobility  Muscle weakness (generalized)  Rationale for Evaluation and Treatment: Rehabilitation  SUBJECTIVE:  SUBJECTIVE STATEMENT:  Today is not quite as good of a good day as last time.  Reports she is doing some beach ball taps with him sitting at home, doesn't do as much for his legs.  PERTINENT HISTORY:  HENERY BRETL is a 82 year old man with dementia, CKD, HTN, CAD, HLD and history of CVA  (2006, 2023), Parkinson's  PAIN:  Are you having pain? No  PRECAUTIONS: Fall  WEIGHT BEARING RESTRICTIONS: No  FALLS: Has patient fallen in last 6 months? No  LIVING ENVIRONMENT: Lives with: lives with their family and lives with their spouse Lives in: House/apartment Stairs:  1 brick high step Has following equipment at home: Environmental consultant - 2 wheeled, Wheelchair (manual), Shower bench, bed side commode, Grab bars,  and hospital bed, sliding pad for car  PLOF: Independent with basic ADLs and Independent with household mobility without device  PATIENT GOALS: Walk around the block with his wife. Be able to use the dining room chair rather than have to use the W/C. Increased I with bed mobility and simple tasks at home like make some coffee or brush his teeth, Step over tub to shower, has bench, but he can't really use it. Up and down the one step to get to back deck. 11/23/22: Patient's wife [name] updated his goals today: Walk around store (gets distracted), swing a golf club again (thinking of driving range), get up on his own, walk without leaning forward, more recognition of the left side (from CVA), not need gait belt, OT-- be able to get dressed more on his own, navigate the newspaper.  OBJECTIVE:  (Measures in this section from initial evaluation unless otherwise noted) DIAGNOSTIC FINDINGS:  MRI 2021 with degenerative changes in lumbar spine, L3-4 R subarticular stenosis COGNITION: Overall cognitive status: History of cognitive impairments - at baseline SENSATION: Not tested COORDINATION: Moderately impaired BLE MUSCLE LENGTH: Hamstrings: severely restricted B Thomas test: Severely restricted B. POSTURE: rounded shoulders, forward head, increased thoracic kyphosis, posterior pelvic tilt, and flexed trunk  LOWER EXTREMITY ROM:   BLE extremely stiff throughout, limited hip ROM in all planes, B knees limited in extension. LOWER EXTREMITY MMT:  3+/5 throughout. Unable to determine accuracy of MMT due to cognition. Functionally demosntrates poor coordinated activation and poor muscular endurance. BED MOBILITY: Min A for Supine<> Sit. Patient was using a ladder in his hospital bed and could move MI. TRANSFERS: Min A, mod VC, tends to lean back.  11/02/22 independent with use of hands on arm chair with a couple of tries CURB: Min A-Has one step out to his deck. STAIRS: N/A  GAIT: Gait pattern: step to  pattern, decreased arm swing- Right, decreased arm swing- Left, decreased step length- Right, decreased step length- Left, shuffling, festinating, trunk flexed, narrow BOS, poor foot clearance- Right, and poor foot clearance- Left Distance walked: 37' Assistive device utilized: Environmental consultant - 2 wheeled Level of assistance: Occasional min A Comments: mod VC to stay close to walker, take longer steps. FUNCTIONAL TESTS:  5 times sit to stand: 38.23  09/29/22  could not performs 5XSTS, 11/02/22 = 47 seconds with cues to use arms Timed up and go (TUG): 47.91  with RW, 07/21/22 TUG 29  seconds without device, 09/29/22 25 seconds, 11/02/22 TUG 20 seconds    [facility] Adult PT Treatment:  DATE: 11/30/22 Neuromuscular re-ed:   Standing 5x sit<>stand:  36.25 sec with RUE support TUG 26.15 sec with no device Standing squats to upright stand, 2 x 5 reps, with hands on knees, mirror/visual and tactile cues for upright posture upon return to standing Seated PWR! exercises Seated PWR! Up with visual (mirror) cues and tactile cues/assist for upright posture;  pt does initiate LUE into upright posture position about 30% of the time PWR! Rock with use of Boomwhackers-wide BOS and lateral reach PWR! Twist-reaching across to high five PT and cues to look behind him PWR! Step to targets with obstacle and manual cues to step over and out, over and in Seated forward lean to upright posture (Modified PWR! Up) with 5 second hold upright posture   Therapeutic Exercise  Seated lower extremity strengthening:  initially with no weights-kicking to ball or kicking to pool noodle; then transition to 2# weights at ankles with kicking ball/pool noodle x 2 minutes (PT calls out varied legs R/L for kicking)                                                                           PATIENT EDUCATION: Education details: kicking (LAQ) to pool noodles/beach ball for leg strengthening Person  educated: Patient and Spouse Education method: Explanation (wife wrote down in her notebook) Education comprehension: verbalized understanding  HOME EXERCISE PROGRAM: TBD  GOALS: Goals reviewed with patient? Yes  SHORT TERM GOALS: Target date: 12/22/22  Perform HEP for PWR! Moves with cues from family. Baseline: Pt's wife, [name], notes walking and hitting beach ball as main HEP. Goal status: REVISED   2. Decrease 5 x STS to < 35 sec to demonstrate improved functional strength  Baseline: 47 seconds on 5/21 per note  Goal status: REVISED   3. Perform sit <> stand transfers from average chair height with Supervision.  Baseline: CGA to min A  Goal status: REVISED   LONG TERM GOALS: Target date: 01/22/23  Patient will perform HEP progression with caregiver assist. Baseline: has minimal HEP Goal status: REVISED  2.  Decrease 5 x STS to < 25 sec to demonstrate improved functional strength Baseline: 47 seconds on 5/21 per note Goal status: REVISED  3. Improve quality of gait to be able to ambulate x 100 feet with supervision. Baseline: CGA with cues  Goal status: REVISED  4.  Patient will perform his bed mobility with MI including rolling, sit<>supine, and scooting in bed. Baseline: on hard surface mat he is independent but on soft bed at home he struggles, wife reports only needs a hand to get up now 11/02/22 Goal status: REVISED  5.  Patient will demonstrate floor<>stand with CGA and verbal cues for patient's wife to be able to assist. Baseline:  Has to call 9-1-1 Goal status: NEW GOAL  ASSESSMENT:  CLINICAL IMPRESSION:  Utilized mirror today as part of visual cues to help generate improved L sided movement.  With initial PWR! Up, pt seems to respond to visual (as well as tactile cue) for using LUE as part of upright posture, and initiates LUE into upright posture position about 30% of the time.  He continues to respond well to use of props and tactile cues, using [employer]  whackers to incorporate larger lateral weightshifting in sitting.  Worked at end of session with leg kicks without>with ankle weights for LAQ/kicks to ball or pool noodle, as a way to incorporate lower extremity strengthening for home.  Pt participates well in session today, but he does need frequent reminder cues to slow pace of alternating movement patterns and for increased power through LUE and LLE.  He will continue to benefit from skilled PT to further improve functional mobility, transfers, gait for decreased fall risk.    OBJECTIVE IMPAIRMENTS: Abnormal gait, decreased activity tolerance, decreased balance, decreased cognition, decreased coordination, decreased endurance, decreased mobility, difficulty walking, decreased ROM, decreased strength, decreased safety awareness, impaired flexibility, impaired UE functional use, improper body mechanics, and postural dysfunction.   PLAN:  PT FREQUENCY: 2x/week  PT DURATION: 12 weeks  PLANNED INTERVENTIONS: Therapeutic exercises, Therapeutic activity, Neuromuscular re-education, Balance training, Gait training, Patient/Family education, Self Care, Joint mobilization, Stair training, Moist heat, and Manual therapy  PLAN FOR NEXT SESSION: Continue to work on [employer] moves sitting, standing and try to add to HEP (hard to find pictures to go with the moves he is able to safely do).  Work on  sit<>stand transitions. Use mirror for visual feedback Gait cues to improve stride and posture. Large amplitude training (may begin in supine and prone in the clinic to potentially engage and load L UE -- if tolerates).    Chalise Pe W., PT 11/30/2022, 4:46 PM  [facility] [address], [address] [place], [place], [address] Phone: [phone]   Fax:  [phone]

## 2022-12-02 ENCOUNTER — Ambulatory Visit: Payer: PPO | Admitting: Occupational Therapy

## 2022-12-02 ENCOUNTER — Ambulatory Visit: Payer: PPO | Admitting: Physical Therapy

## 2022-12-02 ENCOUNTER — Ambulatory Visit: Payer: PPO | Attending: Family Medicine | Admitting: Rehabilitative and Restorative Service Providers"

## 2022-12-02 ENCOUNTER — Encounter: Payer: Self-pay | Admitting: Rehabilitative and Restorative Service Providers"

## 2022-12-02 DIAGNOSIS — R41840 Attention and concentration deficit: Secondary | ICD-10-CM | POA: Insufficient documentation

## 2022-12-02 DIAGNOSIS — I69318 Other symptoms and signs involving cognitive functions following cerebral infarction: Secondary | ICD-10-CM

## 2022-12-02 DIAGNOSIS — R2689 Other abnormalities of gait and mobility: Secondary | ICD-10-CM

## 2022-12-02 DIAGNOSIS — M6281 Muscle weakness (generalized): Secondary | ICD-10-CM

## 2022-12-02 DIAGNOSIS — I69354 Hemiplegia and hemiparesis following cerebral infarction affecting left non-dominant side: Secondary | ICD-10-CM

## 2022-12-02 DIAGNOSIS — R278 Other lack of coordination: Secondary | ICD-10-CM

## 2022-12-02 NOTE — Therapy (Signed)
OUTPATIENT OCCUPATIONAL THERAPY TREATMENT NOTE  Patient Name: William [name] MRN: [id number] DOB:1941-04-14, 82 y.o., male Today's Date: 12/02/2022  PCP & REFERRING PROVIDER:  [name], [name], DO    END OF SESSION:  OT End of Session - 12/02/22 1601     Visit Number 32    Number of Visits 47    Date for OT Re-Evaluation 01/25/23    Authorization Type [employer]    Progress Note Due on Visit 40    OT Start Time 1452    OT Stop Time 1532    OT Time Calculation (min) 40 min    Activity Tolerance Patient tolerated treatment well    Behavior During Therapy WFL for tasks assessed/performed                                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   HTN (hypertension) 07/19/2015   Hyperlipidemia    Major neurocognitive disorder 01/09/2014   Mild, related to stroke history   Nocturia    Renal insufficiency 06/25/2013   S/P carotid endarterectomy    BILATERAL ICA--  PATENT PER DUPLEX  05-19-2012   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/26/2017   Right calf   Urinary frequency    Vitamin D deficiency    Past Surgical History:  Procedure Laterality Date   APPENDECTOMY  AS CHILD   CARDIOVASCULAR STRESS TEST  03-27-2012  DR CRENSHAW   LOW RISK LEXISCAN STUDY-- PROBABLE NORMAL PERFUSION AND SOFT TISSUE ATTENUATION/  NO ISCHEMIA/ EF 51%   CAROTID ENDARTERECTOMY Bilateral LEFT  11-12-2008  DR GREG HAYES   RIGHT ICA  2006  ([facility])   CHOLECYSTECTOMY N/A 02/23/2022   Procedure: LAPAROSCOPIC CHOLECYSTECTOMY;   Surgeon: Quentin Ore, MD;  Location: [facility] OR;  Service: General;  Laterality: N/A;   CYSTOSCOPY W/ RETROGRADES Bilateral 06/22/2021   Procedure: CYSTOSCOPY WITH RETROGRADE PYELOGRAM;  Surgeon: Marcine Matar, MD;  Location: [facility] [place];  Service: Urology;  Laterality: Bilateral;   CYSTOSCOPY WITH LITHOLAPAXY N/A 02/26/2013   Procedure: CYSTOSCOPY WITH LITHOLAPAXY;  Surgeon: Marcine Matar, MD;  Location: [facility];  Service: Urology;  Laterality: N/A;   ENDOSCOPIC RETROGRADE CHOLANGIOPANCREATOGRAPHY (ERCP) WITH PROPOFOL N/A 02/22/2022   Procedure: ENDOSCOPIC RETROGRADE CHOLANGIOPANCREATOGRAPHY (ERCP) WITH PROPOFOL;  Surgeon: Jeani Hawking, MD;  Location: [facility] ENDOSCOPY;  Service: Gastroenterology;  Laterality: N/A;   EYE SURGERY  Jan. 2016   cataract surgery both eyes   INGUINAL HERNIA REPAIR Right 11-08-2006   IR KYPHO EA ADDL LEVEL THORACIC OR LUMBAR  02/12/2021   IR RADIOLOGIST EVAL & MGMT  02/18/2021   MASS EXCISION N/A 03/03/2016   Procedure: EXCISION OF BACK  MASS;  Surgeon: Almond Lint, MD;  Location: [facility];  Service: General;  Laterality: N/A;   MOHS SURGERY Left 1/ 2016   Dr Margo Aye-- Basal cell   PROSTATE SURGERY     REMOVAL  OF STONES  02/22/2022   Procedure: REMOVAL OF STONES;  Surgeon: Jeani Hawking, MD;  Location: [facility] ENDOSCOPY;  Service: Gastroenterology;;   Dennison Mascot  02/22/2022   Procedure: Dennison Mascot;  Surgeon: Jeani Hawking, MD;  Location: [facility] ENDOSCOPY;  Service: Gastroenterology;;   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-C N/A 06/22/2021   Procedure: TRANSURETHRAL RESECTION OF BLADDER TUMOR;  Surgeon: Marcine Matar, MD;  Location: [facility];  Service: Urology;  Laterality: N/A;   TRANSURETHRAL RESECTION OF PROSTATE N/A 02/26/2013   Procedure: TRANSURETHRAL RESECTION OF THE PROSTATE WITH GYRUS INSTRUMENTS;  Surgeon: Marcine Matar, MD;  Location: [facility];   Service: Urology;  Laterality: N/A;   TRANSURETHRAL RESECTION OF PROSTATE N/A 06/22/2021   Procedure: TRANSURETHRAL RESECTION OF THE PROSTATE (TURP);  Surgeon: Marcine Matar, MD;  Location: [facility];  Service: Urology;  Laterality: N/A;   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   Insomnia    Prediabetes    Acute 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 (HCC)    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   Transaminitis 07/27/2021   UTI (urinary tract infection) 07/27/2021   CVA (cerebral vascular accident) (HCC) 07/27/2021   Fall 07/27/2021   Hyperglycemia 07/27/2021   Cholelithiasis 07/27/2021   Hypoxia 07/27/2021   Nausea and vomiting 07/27/2021   Acute metabolic encephalopathy 07/27/2021   Normocytic anemia 07/27/2021   Chronic back pain 07/27/2021   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   Closed fracture of first lumbar vertebra with routine healing 02/03/2021   Closed fracture of multiple ribs 11/18/2020   Anxiety 01/29/2020   Leg pain, bilateral 01/29/2020   Ingrown toenail 07/13/2019   Lumbar spondylosis 05/02/2018   Pain in left knee 03/09/2018   Osteoarthritis of left hip 01/16/2018   Trochanteric bursitis of left hip 01/16/2018   Preventative health care 09/26/2017   HTN (hypertension) 07/19/2015   Hyperlipidemia 07/19/2015    Great toe pain 02/11/2014   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   Obesity (BMI 30-39.9) 06/25/2013   Renal insufficiency 06/25/2013   Weakness of left arm 06/25/2013   Sebaceous cyst 03/03/2011   Sprain of lumbar region 07/31/2010   Rib pain, left 08/29/2009   Carotid artery stenosis, asymptomatic, bilateral 05/02/2009   Eczema, atopic 05/31/2008   Vitamin D deficiency 03/01/2008   BPH (benign prostatic hyperplasia) 08/06/2007   Fasting hyperglycemia 12/21/2006   History of right MCA infarct 2006    ONSET DATE: Acute exacerbation of chronic symptoms (most recent hospitalization 02/26/22 - 03/12/22, followed by SNF and home health)   REFERRING DIAG:  I63.9 (ICD-10-CM) - Cerebrovascular accident (CVA), unspecified mechanism (HCC)  W19.XXXD (ICD-10-CM) - Fall, subsequent encounter    THERAPY DIAG:  Hemiplegia and hemiparesis following cerebral infarction affecting left non-dominant side (HCC)  Muscle weakness (generalized)  Other symptoms and signs involving cognitive functions following cerebral infarction  Other lack of  coordination  Attention and concentration deficit  Other abnormalities of gait and mobility  Rationale for Evaluation and Treatment: Rehabilitation  PERTINENT HISTORY: PMHx includes L basal ganglia and corona radiata infarct 07/27/21, L lower homonymous quadrantanopia; R MCA infarct in 2006 w/ L-sided hemiparesis, NCD, carotid artery disease (bilateral), HTN, HLD,CKD3, vascular dementia, and hx of bladder cancer. He has had falls in 2023, fracturing his clavicle in August, then he had stomach surgery in Sept 2023.  He needs assist at baseline for ADLs. When last documented in acute rehab after having his gall-bladder removed, (03/09/22) he required moderate to maximum assist x2 for most ADLs (more assist for ADLs the require standing), and they recommended rehab in skilled nursing setting. He apparently got Covid at some point and also attended a SNF.  Once he D/C home, he had some episodes of combativeness at night  with "sun-downing."  He is communicative and appropriate, though appears easily confused and distracted, not fully oriented to time and place (Ax2, states date is 59 and cannot say what city he lives in). His wife provides most details, and he is participative, though a poor historian at times. In a nut shell- he has been suffering from dementia and post-stroke effects for years, and his issues were recently exacerbated in Sept of last year after abdominal surgery. He has less use of Lt "stroke side" and new, ritualistic "ticks" in mouth and Rt hand/arm.  Wife states that they do have a caregiver come M, W, F for 4 hours in the middle of the day to mainly help him bathe and dress, which usually tires him out. He has been more combative towards the end of the day as well.   PRECAUTIONS: Fall and Other: Lt hemi, poor cognition, LATEX and adhesive allergies, etc.  ;WEIGHT BEARING RESTRICTIONS: No   SUBJECTIVE:   SUBJECTIVE STATEMENT: Nothing new.  Pt accompanied by:  his wife   PAIN: no   FALLS: Has patient fallen in last 6 months? Yes. Number of falls 1 in last couple weeks and 2-3 near falls (backwards) when getting up from various height chairs   LIVING ENVIRONMENT: Lives with: lives with their spouse Lives in: House/apartment Has following equipment at home: manual w/c, 2 w/w, weighted utensils, shower bench (for tub), bed cane, hospital bed, raised toilet, and possibly more  PLOF: Needs assistance with ADLs, Needs assistance with homemaking, Needs assistance with gait, and Needs assistance with transfers  PATIENT GOALS: 11/30/22: to return to playing golf, increased attention (less distraction) to task to engage in ADLs and leisure pursuits, increase Lt arm usage   OBJECTIVE: (All objective assessments below are from initial evaluation on: 07/07/22 unless otherwise specified.)   HAND DOMINANCE: Right  ADLs: Overall  ADLs: Wife states decreased ability since hospitalization in Sept 23.  Transfers/ambulation related to ADLs: Min A transfer, CGA for mobility Eating: set up Grooming: Mod A- needs assist with oral care, shaving, but can comb his hair UB Dressing: Min A  LB Dressing: Dep  Toileting:  Mod  Bathing: Mod  Tub Shower transfers: Min A  Equipment: Emergency planning/management officer, Grab bars, and Feeding equipment  IADLs: completely dependent for years now, except some ability to use microwave which is now not possible after hospitalization   MOBILITY STATUS: Needs Assist: CGA - Min A, Hx of falls, Festination, difficulty with turns, and neglect on Lt side  POSTURE COMMENTS:  rounded shoulders, forward head, and weight shift left  ACTIVITY TOLERANCE: Activity tolerance: he became tired  and somewhat agitated by the end of the OT evaluation today (after PT and OT about 2.5hours with mostly sedate activities in seated positions)  FUNCTIONAL OUTCOME MEASURES: [facility] 17/30  showing problems with visuospatial/executive function, attention, fluency, and orientation. (at least 26/30 is needed to be considered "normal")  Trinna Balloon in ADLs: 1/6  (very dependent) 11/30/22: PPT#2: 21.79 sec  UPPER EXTREMITY ROM:  from initial eval  Active ROM Right eval Left eval Left 11/30/22  Shoulder scaption 100 85 85  Shoulder abduction   80  Shoulder adduction     Shoulder extension     Shoulder internal rotation Reaches small of back Reaches to hip Reaches to hip  Shoulder external rotation Reaches back of head  Reaches up to ear Reaches up to ear  Elbow flexion 150 130   Elbow extension (-10*)  (-35*)   Wrist flexion 35 20   Wrist extension 50 10   (Blank rows = not tested)  UPPER EXTREMITY MMT:    Tested grossly as "push" and "pull": UE push 4-/5, UE pull 4/5  HAND FUNCTION: Grip strength: Right: 30 lbs; Left: 20 lbs  COORDINATION: Finger Nose Finger test: mildly ataxic but fairly accurate  Box and  Blocks:  Right 11blocks, Left 5blocks  MUSCLE TONE:  Rt- normal, Lt- decreased and worsened by inattention/disuse  COGNITION: Overall cognitive status: Impaired and Difficulty to assess due to: severity of deficits; Poor attention, fluency, orientation, and executive function. Recall is fairly good as well as abstraction.  Seems to have personality changes due to vascular dementia- like an OCD type volitional perseveration of tapping with Rt hand that he can somewhat control.  He did become angry and agitated at the end of the session performing car transfer with spouse: he did shout at her.  VISION: Subjective report: no new changes per pt/spouse, uses "cheaters"   VISION ASSESSMENT: Visual fields seem intact though he had latent responses on the left side showing likely inattention/and/neglect, tracking somewhat delayed horizontally and vertically  PERCEPTION: Impaired: Inattention/neglect: does not attend to left visual field and does not attend to left side of body, Body scheme: poor quality of recognition with shaving, etc., and Spatial orientation: decreased in print and in orientation, especially to Lt side  PRAXIS: Not tested specifically but no overt/stated issues using pen, wearing glasses ,etc.   OBSERVATIONS: Pt taps and flicks and does seem to volitionally perseverate on making a rhythm/noise with Rt hand. He states he is aware of it and chooses to do it.  He does also seem to have mild resting and intention tremor.    TODAY'S TREATMENT:     12/02/22 L shoulder ROM: engaged in shoulder shrugs, attempts at shoulder rolls backward, and towel slides with forward lean to facilitate increased shoulder ROM.  Engaged in chest press and attempts at overhead press with dowel with pt demonstrating decreasing ROM and amplitude of movement with repetition/ as he fatigued.  OT utilized mirror for visual feedback and intermittent tactile cues at L shoulder to further facilitate activation with  shoulder shrugs and rolls.   NMR: Engaged in lateral scooting and alternating hip scoots forward/backward to facilitate carryover to movements as needed for sit > stand, bed mobility, and even LB dressing.  OT providing tactile cues and min facilitation at hips when alternating forward/backward scooting.  OT encouraged pt to attempt forward/backward scooting with isolated him movements in w/c to allow for more firm surface.  Instructed on w/c push ups to facilitate increased use  of LUE with pushup and to facilitate anterior weight shift as needed for sit > stand.  Engaged in reaching/kicking with opposite hand and leg with pt demonstrating ability to complete with min difficulty with LLE and RUE, whereas mod-max difficulty with RLE and LUE.  Pt unable to alternate but able to maintain single side for multiple repetitions and then switch to other R/L combination.  OT providing intermittent cues for slow and controlled movement during each of the above exercises as pt with tendency to increase pace, compromising quality of movement.   11/30/22 Discussion in regards to pt/spouse goals to warrant continued occupational therapy services.  Since starting sinemet, pt with decreased tremors but now with increased episodes of losing balance backwards especially when getting up from toilet/shower chair. Self-feeding: engaged in simulated self-feeding with pt demonstrating good motor control and no tremors or repetitive tapping during task.  Completed PPT #2: 21.79 seconds.  Discussed attention component to task as pt requiring mod-max cues to attend to task and to recall instructions to scoop one bean at a time. UE ROM: OT providing PROM to L shoulder with focus on increased shoulder flexion as well as attempts at scapular retraction.  Pt tolerating ~95* PROM shoulder flexion but limited in shoulder girdle with attempts at PROM and AROM with scapular retraction.    11/16/22- Therapist checked progress towards goals  and discussed with pt/ wife.  Discussion regarding plans to discharge vs. continue with occupational therapy. Pt has started sinemet, and so pt's wife would like to continue OT at [facility]. Discussion with pt/ wife regarding use of  PWR! Up with sit to stand and weight shifts with other freezing episodes. Pt's wife reports freezing episodes in the BR. Pt. flipped cards and picked up penning and quarters off of shelf liner with mod v.c and min-mod difficulty today using LUE. This is an improvement in LUE and attention to left side. Closed chain shoulder flexion in front and reaching for the floor, min-mod v.c         PATIENT EDUCATION: Education details: ongoing condition specific education Person educated: Patient and Spouse Education method: Explanation, Demonstration, Tactile cues, and Verbal cues, handout Education comprehension: Explanation, demonstration, v.c  HOME EXERCISE PROGRAM: Access Code: [email or web address] URL: [email or web address] Date: 12/02/2022 Prepared by: [facility] - Outpatient  Rehab - [facility]  Exercises - Seated Shoulder Flexion Towel Slide at Table Top  - 2 x daily - 10-15 reps - Seated Dowel Chest Press  - 2 x daily - 10-15 reps - Seated Shoulder Flexion AAROM with Dowel  - 2 x daily - 10-15 reps - Dead Bug (IN SITTING)  - 2 x daily - 10 reps     GOALS: Goals reviewed with patient? Yes   SHORT TERM GOALS: (STG required if POC>30 days) Target Date: 12/28/22   Pt will demo/state understanding of HEP with focus on LUE ROM. Goal status: IN PROGRESS  2.  Pt will consistently sustain focus to a simple functional task  in a minimally distracting environment x 6 mins with no more than 1 re-direct(ie: eating, functional task in clinic) Goal status: IN PROGRESS    LONG TERM GOALS: Target Date: 01/25/23  Pt will improve functional ability by decreased impairment per Trinna Balloon assessment from 1/6 to 2/6 or better, for better quality of  life. Goal status:  IN PROGRESS  2.  He will show better functional use of left arm by improved box and blocks Test score from 5 blocks to at least  10 blocks. Baseline: 8 blocks on 11/16/22 Goal status: IN PROGRESS  3. Pt will don a flannel button front shirt consistently with min A and min v.c Baseline: mod A with mod vc Goal status:  IN PROGRESS  4.  Pt will use his LUE as an active assist for ADLS/leisure tasks grossly 40% of the time with min v.c Baseline: uses 25% Goal status:  IN PROGRESS  5.  Pt will open the newspaper and locate sports and used car section with no more than min v.c                           Goal status:   IN PROGRESS  6.  Pt will be able to get up/down from toilet with supervision and no cues.  Goal status: IN PROGRESS  7.  Pt will demonstrate improved sustained attention to complete breakfast in <1 hour  Baseline: currently requiring ~2 hours to complete eating breakfast Goal status: IN PROGRESS  ASSESSMENT:  CLINICAL IMPRESSION: Encouraged pt to engage in above exercises in sitting with back support as pt with increased posterior lean with exercises.  Encouraged use of w/c for firm surface and back support.  Pt benefiting from intermittent tactile cues and use of mirror for visual feedback to facilitate increased attention to LUE during UE exercises and with transitional movements/scooting.  Pt with ability to engage in challenge to complete alternating UE/LE movements, however unable to complete reciprocally.   OT Frequency/DURATION: 2x week x 8 weeks   PLANNED INTERVENTIONS: self care/ADL training, therapeutic exercise, therapeutic activity, neuromuscular re-education, manual therapy, balance training, functional mobility training, moist heat, cryotherapy, patient/family education, cognitive remediation/compensation, visual/perceptual remediation/compensation, psychosocial skills training, energy conservation, coping strategies training, DME and/or AE  instructions, and Re-evaluation  RECOMMENDED OTHER SERVICES: He has already seen PT for balance and functional mobility, he would probably benefit from speech for cognitive issues as well, though OT will be working with cognition somewhat functionally now.  CONSULTED AND AGREED WITH PLAN OF CARE: Patient and family member/caregiver  PLAN FOR NEXT SESSION: Review and add to HEP for L shoulder ROM, table top task for attention   Chalmers Iddings, OTR/L 4:03 PM 12/02/22

## 2022-12-02 NOTE — Therapy (Signed)
OUTPATIENT PHYSICAL THERAPY NEURO TREATMENT NOTE  Patient Name: William [name] MRN: [id number] DOB:1941-04-04, 82 y.o., male Today's Date: 12/02/2022  PCP: Donato Schultz  DO REFERRING PROVIDER: same   END OF SESSION:  PT End of Session - 12/02/22 1537     Visit Number 41    Number of Visits 54    Date for PT Re-Evaluation 01/22/23    Authorization Type HTA    Progress Note Due on Visit 50    PT Start Time 1535    PT Stop Time 1615    PT Time Calculation (min) 40 min    Equipment Utilized During Treatment Gait belt    Activity Tolerance Patient tolerated treatment well    Behavior During Therapy WFL for tasks assessed/performed             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   HTN (hypertension) 07/19/2015   Hyperlipidemia    Major neurocognitive disorder 01/09/2014   Mild, related to stroke history   Nocturia    Renal insufficiency 06/25/2013   S/P carotid endarterectomy    BILATERAL ICA--  PATENT PER DUPLEX  05-19-2012   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   Right calf   Urinary frequency    Vitamin D deficiency    Past Surgical History:  Procedure Laterality Date   APPENDECTOMY  AS CHILD   CARDIOVASCULAR STRESS TEST  03-27-2012  DR CRENSHAW   LOW RISK LEXISCAN STUDY-- PROBABLE NORMAL PERFUSION AND SOFT TISSUE ATTENUATION/  NO ISCHEMIA/ EF 51%   CAROTID ENDARTERECTOMY Bilateral LEFT  11-12-2008  DR GREG HAYES   RIGHT ICA  2006  ([facility])   CHOLECYSTECTOMY N/A 02/23/2022   Procedure: LAPAROSCOPIC CHOLECYSTECTOMY;  Surgeon:  Quentin Ore, MD;  Location: [facility];  Service: General;  Laterality: N/A;   CYSTOSCOPY W/ RETROGRADES Bilateral 06/22/2021   Procedure: CYSTOSCOPY WITH RETROGRADE PYELOGRAM;  Surgeon: Marcine Matar, MD;  Location: [facility];  Service: Urology;  Laterality: Bilateral;   CYSTOSCOPY WITH LITHOLAPAXY N/A 02/26/2013   Procedure: CYSTOSCOPY WITH LITHOLAPAXY;  Surgeon: Marcine Matar, MD;  Location: [facility];  Service: Urology;  Laterality: N/A;   ENDOSCOPIC RETROGRADE CHOLANGIOPANCREATOGRAPHY (ERCP) WITH PROPOFOL N/A 02/22/2022   Procedure: ENDOSCOPIC RETROGRADE CHOLANGIOPANCREATOGRAPHY (ERCP) WITH PROPOFOL;  Surgeon: Jeani Hawking, MD;  Location: [facility] ENDOSCOPY;  Service: Gastroenterology;  Laterality: N/A;   EYE SURGERY  Jan. 2016   cataract surgery both eyes   INGUINAL HERNIA REPAIR Right 11-08-2006   IR KYPHO EA ADDL LEVEL THORACIC OR LUMBAR  02/12/2021   IR RADIOLOGIST EVAL & MGMT  02/18/2021   MASS EXCISION N/A 03/03/2016   Procedure: EXCISION OF BACK  MASS;  Surgeon: Almond Lint, MD;  Location: [facility];  Service: General;  Laterality: N/A;   MOHS SURGERY Left 1/ 2016   Dr Margo Aye-- Basal cell   PROSTATE SURGERY     REMOVAL OF STONES  02/22/2022   Procedure: REMOVAL OF STONES;  Surgeon: Jeani Hawking,  MD;  Location: [facility] ENDOSCOPY;  Service: Gastroenterology;;   Dennison Mascot  02/22/2022   Procedure: Dennison Mascot;  Surgeon: Jeani Hawking, MD;  Location: [facility] ENDOSCOPY;  Service: Gastroenterology;;   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-C N/A 06/22/2021   Procedure: TRANSURETHRAL RESECTION OF BLADDER TUMOR;  Surgeon: Marcine Matar, MD;  Location: [facility];  Service: Urology;  Laterality: N/A;   TRANSURETHRAL RESECTION OF PROSTATE N/A 02/26/2013   Procedure: TRANSURETHRAL RESECTION OF THE PROSTATE WITH GYRUS INSTRUMENTS;  Surgeon: Marcine Matar, MD;  Location: [facility];  Service:  Urology;  Laterality: N/A;   TRANSURETHRAL RESECTION OF PROSTATE N/A 06/22/2021   Procedure: TRANSURETHRAL RESECTION OF THE PROSTATE (TURP);  Surgeon: Marcine Matar, MD;  Location: [facility];  Service: Urology;  Laterality: N/A;   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   Insomnia    Prediabetes    Acute 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 (HCC)    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   Transaminitis 07/27/2021   UTI (urinary tract infection) 07/27/2021   CVA (cerebral vascular accident) (HCC) 07/27/2021   Fall 07/27/2021   Hyperglycemia 07/27/2021   Cholelithiasis 07/27/2021   Hypoxia 07/27/2021   Nausea and vomiting 07/27/2021   Acute metabolic encephalopathy 07/27/2021   Normocytic anemia 07/27/2021   Chronic back pain 07/27/2021   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   Closed fracture of first lumbar vertebra with routine healing 02/03/2021   Closed fracture of multiple ribs 11/18/2020   Anxiety 01/29/2020   Leg pain, bilateral 01/29/2020   Ingrown toenail 07/13/2019   Lumbar spondylosis 05/02/2018   Pain in left knee 03/09/2018   Osteoarthritis of left hip 01/16/2018   Trochanteric bursitis of left hip 01/16/2018   Preventative health care 09/26/2017   HTN (hypertension) 07/19/2015   Hyperlipidemia 07/19/2015   Great  toe pain 02/11/2014   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   Obesity (BMI 30-39.9) 06/25/2013   Renal insufficiency 06/25/2013   Weakness of left arm 06/25/2013   Sebaceous cyst 03/03/2011   Sprain of lumbar region 07/31/2010   Rib pain, left 08/29/2009   Carotid artery stenosis, asymptomatic, bilateral 05/02/2009   Eczema, atopic 05/31/2008   Vitamin D deficiency 03/01/2008   BPH (benign prostatic hyperplasia) 08/06/2007   Fasting hyperglycemia 12/21/2006   History of right MCA infarct 2006    ONSET DATE: 06/29/22 REFERRING DIAG:  Diagnosis  I63.9 (ICD-10-CM) - Cerebrovascular accident (CVA), unspecified mechanism (HCC)  W19.XXXD (ICD-10-CM) - Fall, subsequent encounter    THERAPY DIAG:  Other abnormalities of gait and mobility  Muscle weakness (generalized)  Rationale for Evaluation and Treatment: Rehabilitation  SUBJECTIVE:  SUBJECTIVE STATEMENT:  Patient is more fatigued at night.  PERTINENT HISTORY:  KATHY MARDER is a 82 year old man with dementia, CKD, HTN, CAD, HLD and history of CVA  (2006, 2023), Parkinson's  PAIN:  Are you having pain? No  PRECAUTIONS: Fall  WEIGHT BEARING RESTRICTIONS: No  FALLS: Has patient fallen in last 6 months? No  LIVING ENVIRONMENT: Lives with: lives with their family and lives with their spouse Lives in: House/apartment Stairs:  1 brick high step Has following equipment at home: Environmental consultant - 2 wheeled, Wheelchair (manual), Shower bench, bed side commode, Grab bars, and hospital bed, sliding pad for car  PLOF: Independent with basic ADLs and Independent with household mobility without device  PATIENT GOALS: Walk around the block with his wife. Be able to use the dining room chair rather than have to use the W/C. Increased I with bed  mobility and simple tasks at home like make some coffee or brush his teeth, Step over tub to shower, has bench, but he can't really use it. Up and down the one step to get to back deck. 11/23/22: Patient's wife [name] updated his goals today: Walk around store (gets distracted), swing a golf club again (thinking of driving range), get up on his own, walk without leaning forward, more recognition of the left side (from CVA), not need gait belt, OT-- be able to get dressed more on his own, navigate the newspaper.  OBJECTIVE:  (Measures in this section from initial evaluation unless otherwise noted) DIAGNOSTIC FINDINGS:  MRI 2021 with degenerative changes in lumbar spine, L3-4 R subarticular stenosis COGNITION: Overall cognitive status: History of cognitive impairments - at baseline SENSATION: Not tested COORDINATION: Moderately impaired BLE MUSCLE LENGTH: Hamstrings: severely restricted B Thomas test: Severely restricted B. POSTURE: rounded shoulders, forward head, increased thoracic kyphosis, posterior pelvic tilt, and flexed trunk  LOWER EXTREMITY ROM:   BLE extremely stiff throughout, limited hip ROM in all planes, B knees limited in extension. LOWER EXTREMITY MMT:  3+/5 throughout. Unable to determine accuracy of MMT due to cognition. Functionally demosntrates poor coordinated activation and poor muscular endurance. BED MOBILITY: Min A for Supine<> Sit. Patient was using a ladder in his hospital bed and could move MI. TRANSFERS: Min A, mod VC, tends to lean back.  11/02/22 independent with use of hands on arm chair with a couple of tries CURB: Min A-Has one step out to his deck. STAIRS: N/A  GAIT: Gait pattern: step to pattern, decreased arm swing- Right, decreased arm swing- Left, decreased step length- Right, decreased step length- Left, shuffling, festinating, trunk flexed, narrow BOS, poor foot clearance- Right, and poor foot clearance- Left Distance walked: 87' Assistive device  utilized: Environmental consultant - 2 wheeled Level of assistance: Occasional min A Comments: mod VC to stay close to walker, take longer steps. FUNCTIONAL TESTS:  5 times sit to stand: 38.23  09/29/22  could not performs 5XSTS, 11/02/22 = 47 seconds with cues to use arms Timed up and go (TUG): 47.91  with RW, 07/21/22 TUG 29  seconds without device, 09/29/22 25 seconds, 11/02/22 TUG 20 seconds  [facility] Adult PT Treatment:                                                DATE: 12/02/22 Neuromuscular re-ed: PWR! Exercises Sit<>stand transition to PWR! Up position x 5 reps Supine  PWR lateral stepping x 5 reps with targets for feet and also UE target for reaching Supine PWR twist x 5 reps with tactile cues and target cues for engagement of the L UE Seated weight shifting Worked on anterior weight shifting reaching with boomwacker to stool anterior to patient for forward leaning cues Lateral elbow propping L and R sides with contralateral UE reaching with boomwacker to targets for trunk elongation, weight bearing through UE, and anterior leaning to reach targets (one episode needing assist to the left where he came down to his side on the mat) Therapeutic Activity: Bed mobility  PT observed wife assisting into bed-- she demo'd that she moves his trunk and he lifts his legs. We worked on sit>sidelying to supine>rolling to sidelying and return to sitting Car transfers Wife noted patient fatigued so PT walked out to the car with cues He needed significant assist to scoot into the car while fatigued. Gait: With verbal cues for longer stride x 50 feet x 2 reps Gait out of clinic down curb cutout to car x 75 feet with CGA to min A due to fatigue.   [facility] Adult PT Treatment:                                                DATE: 11/30/22 Neuromuscular re-ed:   Standing 5x sit<>stand:  36.25 sec with RUE support TUG 26.15 sec with no device Standing squats to upright stand, 2 x 5 reps, with hands on knees, mirror/visual and  tactile cues for upright posture upon return to standing Seated PWR! exercises Seated PWR! Up with visual (mirror) cues and tactile cues/assist for upright posture;  pt does initiate LUE into upright posture position about 30% of the time PWR! Rock with use of Boomwhackers-wide BOS and lateral reach PWR! Twist-reaching across to high five PT and cues to look behind him PWR! Step to targets with obstacle and manual cues to step over and out, over and in Seated forward lean to upright posture (Modified PWR! Up) with 5 second hold upright posture   Therapeutic Exercise  Seated lower extremity strengthening:  initially with no weights-kicking to ball or kicking to pool noodle; then transition to 2# weights at ankles with kicking ball/pool noodle x 2 minutes (PT calls out varied legs R/L for kicking)                                                                           PATIENT EDUCATION: Education details: kicking (LAQ) to pool noodles/beach ball for leg strengthening Person educated: Patient and Spouse Education method: Explanation (wife wrote down in her notebook) Education comprehension: verbalized understanding  HOME EXERCISE PROGRAM: TBD  GOALS: Goals reviewed with patient? Yes  SHORT TERM GOALS: Target date: 12/22/22  Perform HEP for PWR! Moves with cues from family. Baseline: Pt's wife, [name], notes walking and hitting beach ball as main HEP. Goal status: REVISED   2. Decrease 5 x STS to < 35 sec to demonstrate improved functional strength  Baseline: 47 seconds on 5/21 per note  Goal status: REVISED  3. Perform sit <> stand transfers from average chair height with Supervision.  Baseline: CGA to min A  Goal status: REVISED   LONG TERM GOALS: Target date: 01/22/23  Patient will perform HEP progression with caregiver assist. Baseline: has minimal HEP Goal status: REVISED  2.  Decrease 5 x STS to < 25 sec to demonstrate improved functional strength Baseline: 47 seconds  on 5/21 per note Goal status: REVISED  3. Improve quality of gait to be able to ambulate x 100 feet with supervision. Baseline: CGA with cues  Goal status: REVISED  4.  Patient will perform his bed mobility with MI including rolling, sit<>supine, and scooting in bed. Baseline: on hard surface mat he is independent but on soft bed at home he struggles, wife reports only needs a hand to get up now 11/02/22 Goal status: REVISED  5.  Patient will demonstrate floor<>stand with CGA and verbal cues for patient's wife to be able to assist. Baseline:  Has to call 9-1-1 Goal status: NEW GOAL  ASSESSMENT:  CLINICAL IMPRESSION:  PT continued to utilize mirror for L UE cues. We also worked on [employer]! Moves in supine position for stepping to targets, adding Ues as able. Patient needs cues for L UE to engage. This visit, PT was 2nd after OT and patient demo'd more significant fatigue at end of session per posterior trunk leaning.  He will continue to benefit from skilled PT to further improve functional mobility, transfers, gait for decreased fall risk.    OBJECTIVE IMPAIRMENTS: Abnormal gait, decreased activity tolerance, decreased balance, decreased cognition, decreased coordination, decreased endurance, decreased mobility, difficulty walking, decreased ROM, decreased strength, decreased safety awareness, impaired flexibility, impaired UE functional use, improper body mechanics, and postural dysfunction.   PLAN:  PT FREQUENCY: 2x/week  PT DURATION: 12 weeks  PLANNED INTERVENTIONS: Therapeutic exercises, Therapeutic activity, Neuromuscular re-education, Balance training, Gait training, Patient/Family education, Self Care, Joint mobilization, Stair training, Moist heat, and Manual therapy  PLAN FOR NEXT SESSION: Continue to work on [employer] moves sitting, standing and try to add to HEP (hard to find pictures to go with the moves he is able to safely do).  Work on  sit<>stand transitions. Use mirror for visual  feedback Gait cues to improve stride and posture. Large amplitude training (may begin in supine and prone in the clinic to potentially engage and load L UE -- if tolerates).    Dajai Wahlert, PT 12/02/2022, 3:37 PM  [facility] [address] [place], [place], [address] Phone: [phone]   Fax:  [phone] 1863[facility]

## 2022-12-06 ENCOUNTER — Other Ambulatory Visit (HOSPITAL_BASED_OUTPATIENT_CLINIC_OR_DEPARTMENT_OTHER): Payer: Self-pay

## 2022-12-06 ENCOUNTER — Ambulatory Visit: Payer: PPO | Admitting: Physical Therapy

## 2022-12-07 ENCOUNTER — Ambulatory Visit: Payer: PPO | Admitting: Occupational Therapy

## 2022-12-07 ENCOUNTER — Ambulatory Visit: Payer: PPO | Admitting: Podiatry

## 2022-12-07 DIAGNOSIS — R41840 Attention and concentration deficit: Secondary | ICD-10-CM

## 2022-12-07 DIAGNOSIS — I69354 Hemiplegia and hemiparesis following cerebral infarction affecting left non-dominant side: Secondary | ICD-10-CM

## 2022-12-07 DIAGNOSIS — B351 Tinea unguium: Secondary | ICD-10-CM

## 2022-12-07 DIAGNOSIS — R278 Other lack of coordination: Secondary | ICD-10-CM

## 2022-12-07 DIAGNOSIS — I69318 Other symptoms and signs involving cognitive functions following cerebral infarction: Secondary | ICD-10-CM

## 2022-12-07 DIAGNOSIS — M79676 Pain in unspecified toe(s): Secondary | ICD-10-CM | POA: Diagnosis not present

## 2022-12-07 DIAGNOSIS — R2689 Other abnormalities of gait and mobility: Secondary | ICD-10-CM | POA: Diagnosis not present

## 2022-12-07 DIAGNOSIS — Z7901 Long term (current) use of anticoagulants: Secondary | ICD-10-CM | POA: Diagnosis not present

## 2022-12-07 DIAGNOSIS — M6281 Muscle weakness (generalized): Secondary | ICD-10-CM

## 2022-12-07 NOTE — Therapy (Signed)
OUTPATIENT OCCUPATIONAL THERAPY TREATMENT NOTE  Patient Name: William [name] MRN: [id number] DOB:22-Feb-1941, 82 y.o., male Today's Date: 12/07/2022  PCP & REFERRING PROVIDER:  [name], [name], DO    END OF SESSION:  OT End of Session - 12/07/22 1446     Visit Number 33    Number of Visits 47    Date for OT Re-Evaluation 01/25/23    Authorization Type [employer]    Progress Note Due on Visit 40    OT Start Time 1445    OT Stop Time 1530    OT Time Calculation (min) 45 min    Activity Tolerance Patient tolerated treatment well    Behavior During Therapy [facility] for tasks assessed/performed                                 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   HTN (hypertension) 07/19/2015   Hyperlipidemia    Major neurocognitive disorder 01/09/2014   Mild, related to stroke history   Nocturia    Renal insufficiency 06/25/2013   S/P carotid endarterectomy    BILATERAL ICA--  PATENT PER DUPLEX  05-19-2012   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/26/2017   Right calf   Urinary frequency    Vitamin D deficiency    Past Surgical History:  Procedure Laterality Date   APPENDECTOMY  AS CHILD   CARDIOVASCULAR STRESS TEST  03-27-2012  DR CRENSHAW   LOW RISK LEXISCAN STUDY-- PROBABLE NORMAL PERFUSION AND SOFT TISSUE ATTENUATION/  NO ISCHEMIA/ EF 51%   CAROTID ENDARTERECTOMY Bilateral LEFT  11-12-2008  DR GREG HAYES   RIGHT ICA  2006  ([facility])   CHOLECYSTECTOMY N/A 02/23/2022   Procedure: LAPAROSCOPIC CHOLECYSTECTOMY;   Surgeon: Quentin Ore, MD;  Location: [facility] OR;  Service: General;  Laterality: N/A;   CYSTOSCOPY W/ RETROGRADES Bilateral 06/22/2021   Procedure: CYSTOSCOPY WITH RETROGRADE PYELOGRAM;  Surgeon: Marcine Matar, MD;  Location: [facility] [place];  Service: Urology;  Laterality: Bilateral;   CYSTOSCOPY WITH LITHOLAPAXY N/A 02/26/2013   Procedure: CYSTOSCOPY WITH LITHOLAPAXY;  Surgeon: Marcine Matar, MD;  Location: [facility];  Service: Urology;  Laterality: N/A;   ENDOSCOPIC RETROGRADE CHOLANGIOPANCREATOGRAPHY (ERCP) WITH PROPOFOL N/A 02/22/2022   Procedure: ENDOSCOPIC RETROGRADE CHOLANGIOPANCREATOGRAPHY (ERCP) WITH PROPOFOL;  Surgeon: Jeani Hawking, MD;  Location: [facility] ENDOSCOPY;  Service: Gastroenterology;  Laterality: N/A;   EYE SURGERY  Jan. 2016   cataract surgery both eyes   INGUINAL HERNIA REPAIR Right 11-08-2006   IR KYPHO EA ADDL LEVEL THORACIC OR LUMBAR  02/12/2021   IR RADIOLOGIST EVAL & MGMT  02/18/2021   MASS EXCISION N/A 03/03/2016   Procedure: EXCISION OF BACK  MASS;  Surgeon: Almond Lint, MD;  Location: [facility];  Service: General;  Laterality: N/A;   MOHS SURGERY Left 1/ 2016   Dr Margo Aye-- Basal cell   PROSTATE SURGERY  REMOVAL OF STONES  02/22/2022   Procedure: REMOVAL OF STONES;  Surgeon: Jeani Hawking, MD;  Location: [facility] ENDOSCOPY;  Service: Gastroenterology;;   Dennison Mascot  02/22/2022   Procedure: Dennison Mascot;  Surgeon: Jeani Hawking, MD;  Location: [facility] ENDOSCOPY;  Service: Gastroenterology;;   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-C N/A 06/22/2021   Procedure: TRANSURETHRAL RESECTION OF BLADDER TUMOR;  Surgeon: Marcine Matar, MD;  Location: [facility];  Service: Urology;  Laterality: N/A;   TRANSURETHRAL RESECTION OF PROSTATE N/A 02/26/2013   Procedure: TRANSURETHRAL RESECTION OF THE PROSTATE WITH GYRUS INSTRUMENTS;  Surgeon: Marcine Matar, MD;  Location: [facility];   Service: Urology;  Laterality: N/A;   TRANSURETHRAL RESECTION OF PROSTATE N/A 06/22/2021   Procedure: TRANSURETHRAL RESECTION OF THE PROSTATE (TURP);  Surgeon: Marcine Matar, MD;  Location: [facility];  Service: Urology;  Laterality: N/A;   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   Insomnia    Prediabetes    Acute 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 (HCC)    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   Transaminitis 07/27/2021   UTI (urinary tract infection) 07/27/2021   CVA (cerebral vascular accident) (HCC) 07/27/2021   Fall 07/27/2021   Hyperglycemia 07/27/2021   Cholelithiasis 07/27/2021   Hypoxia 07/27/2021   Nausea and vomiting 07/27/2021   Acute metabolic encephalopathy 07/27/2021   Normocytic anemia 07/27/2021   Chronic back pain 07/27/2021   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   Closed fracture of first lumbar vertebra with routine healing 02/03/2021   Closed fracture of multiple ribs 11/18/2020   Anxiety 01/29/2020   Leg pain, bilateral 01/29/2020   Ingrown toenail 07/13/2019   Lumbar spondylosis 05/02/2018   Pain in left knee 03/09/2018   Osteoarthritis of left hip 01/16/2018   Trochanteric bursitis of left hip 01/16/2018   Preventative health care 09/26/2017   HTN (hypertension) 07/19/2015   Hyperlipidemia 07/19/2015    Great toe pain 02/11/2014   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   Obesity (BMI 30-39.9) 06/25/2013   Renal insufficiency 06/25/2013   Weakness of left arm 06/25/2013   Sebaceous cyst 03/03/2011   Sprain of lumbar region 07/31/2010   Rib pain, left 08/29/2009   Carotid artery stenosis, asymptomatic, bilateral 05/02/2009   Eczema, atopic 05/31/2008   Vitamin D deficiency 03/01/2008   BPH (benign prostatic hyperplasia) 08/06/2007   Fasting hyperglycemia 12/21/2006   History of right MCA infarct 2006    ONSET DATE: Acute exacerbation of chronic symptoms (most recent hospitalization 02/26/22 - 03/12/22, followed by SNF and home health)   REFERRING DIAG:  I63.9 (ICD-10-CM) - Cerebrovascular accident (CVA), unspecified mechanism (HCC)  W19.XXXD (ICD-10-CM) - Fall, subsequent encounter    THERAPY DIAG:  Hemiplegia and hemiparesis following cerebral infarction affecting left non-dominant side (HCC)  Muscle weakness (generalized)  Other symptoms and signs involving cognitive functions following cerebral infarction  Other lack  of coordination  Attention and concentration deficit  Rationale for Evaluation and Treatment: Rehabilitation  PERTINENT HISTORY: PMHx includes L basal ganglia and corona radiata infarct 07/27/21, L lower homonymous quadrantanopia; R MCA infarct in 2006 w/ L-sided hemiparesis, NCD, carotid artery disease (bilateral), HTN, HLD,CKD3, vascular dementia, and hx of bladder cancer. He has had falls in 2023, fracturing his clavicle in August, then he had stomach surgery in Sept 2023.  He needs assist at baseline for ADLs. When last documented in acute rehab after having his gall-bladder removed, (03/09/22) he required moderate to maximum assist x2 for most ADLs (more assist for ADLs the require standing), and they recommended rehab in skilled nursing setting. He apparently got Covid at some point and also attended a SNF. Once he D/C home, he had some episodes of  combativeness at night  with "sun-downing."  He is communicative and appropriate, though appears easily confused and distracted, not fully oriented to time and place (Ax2, states date is 63 and cannot say what city he lives in). His wife provides most details, and he is participative, though a poor historian at times. In a nut shell- he has been suffering from dementia and post-stroke effects for years, and his issues were recently exacerbated in Sept of last year after abdominal surgery. He has less use of Lt "stroke side" and new, ritualistic "ticks" in mouth and Rt hand/arm.  Wife states that they do have a caregiver come M, W, F for 4 hours in the middle of the day to mainly help him bathe and dress, which usually tires him out. He has been more combative towards the end of the day as well.   PRECAUTIONS: Fall and Other: Lt hemi, poor cognition, LATEX and adhesive allergies, etc.  ;WEIGHT BEARING RESTRICTIONS: No   SUBJECTIVE:   SUBJECTIVE STATEMENT: Pt reports that he did his exercises this morning.    Pt accompanied by:  his wife   PAIN: no   FALLS: Has patient fallen in last 6 months? Yes. Number of falls 1 in last couple weeks and 2-3 near falls (backwards) when getting up from various height chairs   LIVING ENVIRONMENT: Lives with: lives with their spouse Lives in: House/apartment Has following equipment at home: manual w/c, 2 w/w, weighted utensils, shower bench (for tub), bed cane, hospital bed, raised toilet, and possibly more  PLOF: Needs assistance with ADLs, Needs assistance with homemaking, Needs assistance with gait, and Needs assistance with transfers  PATIENT GOALS: 11/30/22: to return to playing golf, increased attention (less distraction) to task to engage in ADLs and leisure pursuits, increase Lt arm usage   OBJECTIVE: (All objective assessments below are from initial evaluation on: 07/07/22 unless otherwise specified.)   HAND DOMINANCE: Right  ADLs: Overall  ADLs: Wife states decreased ability since hospitalization in Sept 23.  Transfers/ambulation related to ADLs: Min A transfer, CGA for mobility Eating: set up Grooming: Mod A- needs assist with oral care, shaving, but can comb his hair UB Dressing: Min A  LB Dressing: Dep  Toileting:  Mod  Bathing: Mod  Tub Shower transfers: Min A  Equipment: Emergency planning/management officer, Grab bars, and Feeding equipment  IADLs: completely dependent for years now, except some ability to use microwave which is now not possible after hospitalization   MOBILITY STATUS: Needs Assist: CGA - Min A, Hx of falls, Festination, difficulty with turns, and neglect on Lt side  POSTURE COMMENTS:  rounded shoulders, forward head, and weight shift left  ACTIVITY TOLERANCE: Activity tolerance:  he became tired and somewhat agitated by the end of the OT evaluation today (after PT and OT about 2.5hours with mostly sedate activities in seated positions)  FUNCTIONAL OUTCOME MEASURES: [facility] 17/30  showing problems with visuospatial/executive function, attention, fluency, and orientation. (at least 26/30 is needed to be considered "normal")  Trinna Balloon in ADLs: 1/6  (very dependent) 11/30/22: PPT#2: 21.79 sec  UPPER EXTREMITY ROM:  from initial eval  Active ROM Right eval Left eval Left 11/30/22  Shoulder scaption 100 85 85  Shoulder abduction   80  Shoulder adduction     Shoulder extension     Shoulder internal rotation Reaches small of back Reaches to hip Reaches to hip  Shoulder external rotation Reaches back of head  Reaches up to ear Reaches up to ear  Elbow flexion 150 130   Elbow extension (-10*)  (-35*)   Wrist flexion 35 20   Wrist extension 50 10   (Blank rows = not tested)  UPPER EXTREMITY MMT:    Tested grossly as "push" and "pull": UE push 4-/5, UE pull 4/5  HAND FUNCTION: Grip strength: Right: 30 lbs; Left: 20 lbs  COORDINATION: Finger Nose Finger test: mildly ataxic but fairly accurate  Box and  Blocks:  Right 11blocks, Left 5blocks  MUSCLE TONE:  Rt- normal, Lt- decreased and worsened by inattention/disuse  COGNITION: Overall cognitive status: Impaired and Difficulty to assess due to: severity of deficits; Poor attention, fluency, orientation, and executive function. Recall is fairly good as well as abstraction.  Seems to have personality changes due to vascular dementia- like an OCD type volitional perseveration of tapping with Rt hand that he can somewhat control.  He did become angry and agitated at the end of the session performing car transfer with spouse: he did shout at her.  VISION: Subjective report: no new changes per pt/spouse, uses "cheaters"   VISION ASSESSMENT: Visual fields seem intact though he had latent responses on the left side showing likely inattention/and/neglect, tracking somewhat delayed horizontally and vertically  PERCEPTION: Impaired: Inattention/neglect: does not attend to left visual field and does not attend to left side of body, Body scheme: poor quality of recognition with shaving, etc., and Spatial orientation: decreased in print and in orientation, especially to Lt side  PRAXIS: Not tested specifically but no overt/stated issues using pen, wearing glasses ,etc.   OBSERVATIONS: Pt taps and flicks and does seem to volitionally perseverate on making a rhythm/noise with Rt hand. He states he is aware of it and chooses to do it.  He does also seem to have mild resting and intention tremor.    TODAY'S TREATMENT:     12/07/22 Engaged in table top task for attention and bimanual task progressing from simple stacking activity to pattern replication.  Pt initially picking up large blocks with R hand, however with tactile cues to cover R hand pt able to stack and sort by color with L hand.  Pt requiring increased time and cues to visually scan to L to locate targets in L visual field.  Progressed to attempting colored peg pattern replication with peg board  placed at midline.  Pt placing pegs only on R side of board with min adherence to pattern with some portions being similar in nature but with no clear replication.  Transitioned to visual attention with newspaper scavenger hunt.  Pt initially flipping through newspaper with inability to locate article on L side of page.  Pt then encouraged to utilize table of contents with  pt able to locate correct page and then flip until locating.  Min cues when pt arrived at 35 to locate pg 34 on L.  Pt then able to skim article and identify a few items with article placed at midline.  Pt then scanning newspaper to locate used car section.  Pt then to locate mileage on cars with pt correctly identifying 75% of items.  Pt with intermittent sustained attention, as initially flipping back to used car page despite not being finished with sports article.     12/02/22 L shoulder ROM: engaged in shoulder shrugs, attempts at shoulder rolls backward, and towel slides with forward lean to facilitate increased shoulder ROM.  Engaged in chest press and attempts at overhead press with dowel with pt demonstrating decreasing ROM and amplitude of movement with repetition/ as he fatigued.  OT utilized mirror for visual feedback and intermittent tactile cues at L shoulder to further facilitate activation with shoulder shrugs and rolls.   NMR: Engaged in lateral scooting and alternating hip scoots forward/backward to facilitate carryover to movements as needed for sit > stand, bed mobility, and even LB dressing.  OT providing tactile cues and min facilitation at hips when alternating forward/backward scooting.  OT encouraged pt to attempt forward/backward scooting with isolated him movements in w/c to allow for more firm surface.  Instructed on w/c push ups to facilitate increased use of LUE with pushup and to facilitate anterior weight shift as needed for sit > stand.  Engaged in reaching/kicking with opposite hand and leg with pt  demonstrating ability to complete with min difficulty with LLE and RUE, whereas mod-max difficulty with RLE and LUE.  Pt unable to alternate but able to maintain single side for multiple repetitions and then switch to other R/L combination.  OT providing intermittent cues for slow and controlled movement during each of the above exercises as pt with tendency to increase pace, compromising quality of movement.   11/30/22 Discussion in regards to pt/spouse goals to warrant continued occupational therapy services.  Since starting sinemet, pt with decreased tremors but now with increased episodes of losing balance backwards especially when getting up from toilet/shower chair. Self-feeding: engaged in simulated self-feeding with pt demonstrating good motor control and no tremors or repetitive tapping during task.  Completed PPT #2: 21.79 seconds.  Discussed attention component to task as pt requiring mod-max cues to attend to task and to recall instructions to scoop one bean at a time. UE ROM: OT providing PROM to L shoulder with focus on increased shoulder flexion as well as attempts at scapular retraction.  Pt tolerating ~95* PROM shoulder flexion but limited in shoulder girdle with attempts at PROM and AROM with scapular retraction.       PATIENT EDUCATION: Education details: ongoing condition specific education Person educated: Patient and Spouse Education method: Explanation, Demonstration, Tactile cues, and Verbal cues, handout Education comprehension: Explanation, demonstration, v.c  HOME EXERCISE PROGRAM: Access Code: [email or web address] URL: [email or web address] Date: 12/02/2022 Prepared by: [facility] - Outpatient  Rehab - [facility]  Exercises - Seated Shoulder Flexion Towel Slide at Table Top  - 2 x daily - 10-15 reps - Seated Dowel Chest Press  - 2 x daily - 10-15 reps - Seated Shoulder Flexion AAROM with Dowel  - 2 x daily - 10-15 reps - Dead Bug (IN SITTING)  - 2 x daily  - 10 reps     GOALS: Goals reviewed with patient? Yes   SHORT TERM GOALS: (STG required if POC>30  days) Target Date: 12/28/22   Pt will demo/state understanding of HEP with focus on LUE ROM. Goal status: IN PROGRESS  2.  Pt will consistently sustain focus to a simple functional task  in a minimally distracting environment x 6 mins with no more than 1 re-direct(ie: eating, functional task in clinic) Goal status: IN PROGRESS    LONG TERM GOALS: Target Date: 01/25/23  Pt will improve functional ability by decreased impairment per Trinna Balloon assessment from 1/6 to 2/6 or better, for better quality of life. Goal status:  IN PROGRESS  2.  He will show better functional use of left arm by improved box and blocks Test score from 5 blocks to at least 10 blocks. Baseline: 8 blocks on 11/16/22 Goal status: IN PROGRESS  3. Pt will don a flannel button front shirt consistently with min A and min v.c Baseline: mod A with mod vc Goal status:  IN PROGRESS  4.  Pt will use his LUE as an active assist for ADLS/leisure tasks grossly 40% of the time with min v.c Baseline: uses 25% Goal status:  IN PROGRESS  5.  Pt will open the newspaper and locate sports and used car section with no more than min v.c                           Goal status:   IN PROGRESS  6.  Pt will be able to get up/down from toilet with supervision and no cues.  Goal status: IN PROGRESS  7.  Pt will demonstrate improved sustained attention to complete breakfast in <1 hour  Baseline: currently requiring ~2 hours to complete eating breakfast Goal status: IN PROGRESS  ASSESSMENT:  CLINICAL IMPRESSION: Pt continues to demonstrate decreased attention to L visual field with peg and newspaper article activities.  Pt did recognize water bottle placed on L side.  Pt demonstrating improved attention to task with placing pegs (despite not adhering to pattern) and sorting blocks by color.  Pt also with good attention and  ability to answer questions with newspaper scavenger hunt.  Door of treatment room remained open to allow for some outside distractions, however those did not seem to distract pt this session. OT primarily placed in front of pt which may have encouraged maintained attention to tasks.   OT Frequency/DURATION: 2x week x 8 weeks   PLANNED INTERVENTIONS: self care/ADL training, therapeutic exercise, therapeutic activity, neuromuscular re-education, manual therapy, balance training, functional mobility training, moist heat, cryotherapy, patient/family education, cognitive remediation/compensation, visual/perceptual remediation/compensation, psychosocial skills training, energy conservation, coping strategies training, DME and/or AE instructions, and Re-evaluation  RECOMMENDED OTHER SERVICES: He has already seen PT for balance and functional mobility, he would probably benefit from speech for cognitive issues as well, though OT will be working with cognition somewhat functionally now.  CONSULTED AND AGREED WITH PLAN OF CARE: Patient and family member/caregiver  PLAN FOR NEXT SESSION: Review and add to HEP for L shoulder ROM, table top task for attention   Janace Decker, OTR/L 2:47 PM 12/07/22

## 2022-12-08 ENCOUNTER — Ambulatory Visit: Payer: PPO | Admitting: Occupational Therapy

## 2022-12-08 DIAGNOSIS — R278 Other lack of coordination: Secondary | ICD-10-CM

## 2022-12-08 DIAGNOSIS — R41840 Attention and concentration deficit: Secondary | ICD-10-CM

## 2022-12-08 DIAGNOSIS — I69354 Hemiplegia and hemiparesis following cerebral infarction affecting left non-dominant side: Secondary | ICD-10-CM

## 2022-12-08 DIAGNOSIS — I69318 Other symptoms and signs involving cognitive functions following cerebral infarction: Secondary | ICD-10-CM

## 2022-12-08 DIAGNOSIS — M6281 Muscle weakness (generalized): Secondary | ICD-10-CM

## 2022-12-08 DIAGNOSIS — R2689 Other abnormalities of gait and mobility: Secondary | ICD-10-CM | POA: Diagnosis not present

## 2022-12-08 NOTE — Therapy (Signed)
OUTPATIENT OCCUPATIONAL THERAPY TREATMENT NOTE  Patient Name: William [name] MRN: [id number] DOB:[birth year], 82 y.o., male Today's Date: 12/08/2022  PCP & REFERRING PROVIDER:  [name], [name], DO    END OF SESSION:  OT End of Session - 12/08/22 1540     Visit Number 34    Number of Visits 47    Date for OT Re-Evaluation 01/25/23    Authorization Type [employer]    Progress Note Due on Visit 40    OT Start Time 1537    OT Stop Time 1617    OT Time Calculation (min) 40 min    Activity Tolerance Patient tolerated treatment well    Behavior During Therapy WFL for tasks assessed/performed                                 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   HTN (hypertension) 07/19/2015   Hyperlipidemia    Major neurocognitive disorder 01/09/2014   Mild, related to stroke history   Nocturia    Renal insufficiency 06/25/2013   S/P carotid endarterectomy    BILATERAL ICA--  PATENT PER DUPLEX  05-19-2012   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/26/2017   Right calf   Urinary frequency    Vitamin D deficiency    Past Surgical History:  Procedure Laterality Date   APPENDECTOMY  AS CHILD   CARDIOVASCULAR STRESS TEST  03-27-2012  DR CRENSHAW   LOW RISK LEXISCAN STUDY-- PROBABLE NORMAL PERFUSION AND SOFT TISSUE ATTENUATION/  NO ISCHEMIA/ EF 51%   CAROTID ENDARTERECTOMY Bilateral LEFT  11-12-2008  DR GREG HAYES   RIGHT ICA  2006  ([facility])   CHOLECYSTECTOMY N/A 02/23/2022   Procedure: LAPAROSCOPIC CHOLECYSTECTOMY;   Surgeon: Quentin Ore, MD;  Location: [facility] OR;  Service: General;  Laterality: N/A;   CYSTOSCOPY W/ RETROGRADES Bilateral 06/22/2021   Procedure: CYSTOSCOPY WITH RETROGRADE PYELOGRAM;  Surgeon: Marcine Matar, MD;  Location: [facility];  Service: Urology;  Laterality: Bilateral;   CYSTOSCOPY WITH LITHOLAPAXY N/A 02/26/2013   Procedure: CYSTOSCOPY WITH LITHOLAPAXY;  Surgeon: Marcine Matar, MD;  Location: [facility];  Service: Urology;  Laterality: N/A;   ENDOSCOPIC RETROGRADE CHOLANGIOPANCREATOGRAPHY (ERCP) WITH PROPOFOL N/A 02/22/2022   Procedure: ENDOSCOPIC RETROGRADE CHOLANGIOPANCREATOGRAPHY (ERCP) WITH PROPOFOL;  Surgeon: Jeani Hawking, MD;  Location: [facility] ENDOSCOPY;  Service: Gastroenterology;  Laterality: N/A;   EYE SURGERY  Jan. 2016   cataract surgery both eyes   INGUINAL HERNIA REPAIR Right 11-08-2006   IR KYPHO EA ADDL LEVEL THORACIC OR LUMBAR  02/12/2021   IR RADIOLOGIST EVAL & MGMT  02/18/2021   MASS EXCISION N/A 03/03/2016   Procedure: EXCISION OF BACK  MASS;  Surgeon: Almond Lint, MD;  Location: [facility];  Service: General;  Laterality: N/A;   MOHS SURGERY Left 1/ 2016   Dr Margo Aye-- Basal cell   PROSTATE SURGERY  REMOVAL OF STONES  02/22/2022   Procedure: REMOVAL OF STONES;  Surgeon: Jeani Hawking, MD;  Location: [facility] ENDOSCOPY;  Service: Gastroenterology;;   Dennison Mascot  02/22/2022   Procedure: Dennison Mascot;  Surgeon: Jeani Hawking, MD;  Location: [facility] ENDOSCOPY;  Service: Gastroenterology;;   TRANSURETHRAL RESECTION OF BLADDER TUMOR WITH MITOMYCIN-C N/A 06/22/2021   Procedure: TRANSURETHRAL RESECTION OF BLADDER TUMOR;  Surgeon: Marcine Matar, MD;  Location: [facility];  Service: Urology;  Laterality: N/A;   TRANSURETHRAL RESECTION OF PROSTATE N/A 02/26/2013   Procedure: TRANSURETHRAL RESECTION OF THE PROSTATE WITH GYRUS INSTRUMENTS;  Surgeon: Marcine Matar, MD;  Location: [facility];   Service: Urology;  Laterality: N/A;   TRANSURETHRAL RESECTION OF PROSTATE N/A 06/22/2021   Procedure: TRANSURETHRAL RESECTION OF THE PROSTATE (TURP);  Surgeon: Marcine Matar, MD;  Location: [facility];  Service: Urology;  Laterality: N/A;   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   Insomnia    Prediabetes    Acute 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 (HCC)    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   Transaminitis 07/27/2021   UTI (urinary tract infection) 07/27/2021   CVA (cerebral vascular accident) (HCC) 07/27/2021   Fall 07/27/2021   Hyperglycemia 07/27/2021   Cholelithiasis 07/27/2021   Hypoxia 07/27/2021   Nausea and vomiting 07/27/2021   Acute metabolic encephalopathy 07/27/2021   Normocytic anemia 07/27/2021   Chronic back pain 07/27/2021   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   Closed fracture of first lumbar vertebra with routine healing 02/03/2021   Closed fracture of multiple ribs 11/18/2020   Anxiety 01/29/2020   Leg pain, bilateral 01/29/2020   Ingrown toenail 07/13/2019   Lumbar spondylosis 05/02/2018   Pain in left knee 03/09/2018   Osteoarthritis of left hip 01/16/2018   Trochanteric bursitis of left hip 01/16/2018   Preventative health care 09/26/2017   HTN (hypertension) 07/19/2015   Hyperlipidemia 07/19/2015    Great toe pain 02/11/2014   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   Obesity (BMI 30-39.9) 06/25/2013   Renal insufficiency 06/25/2013   Weakness of left arm 06/25/2013   Sebaceous cyst 03/03/2011   Sprain of lumbar region 07/31/2010   Rib pain, left 08/29/2009   Carotid artery stenosis, asymptomatic, bilateral 05/02/2009   Eczema, atopic 05/31/2008   Vitamin D deficiency 03/01/2008   BPH (benign prostatic hyperplasia) 08/06/2007   Fasting hyperglycemia 12/21/2006   History of right MCA infarct 2006    ONSET DATE: Acute exacerbation of chronic symptoms (most recent hospitalization 02/26/22 - 03/12/22, followed by SNF and home health)   REFERRING DIAG:  I63.9 (ICD-10-CM) - Cerebrovascular accident (CVA), unspecified mechanism (HCC)  W19.XXXD (ICD-10-CM) - Fall, subsequent encounter    THERAPY DIAG:  Hemiplegia and hemiparesis following cerebral infarction affecting left non-dominant side (HCC)  Muscle weakness (generalized)  Other symptoms and signs involving cognitive functions following cerebral infarction  Other lack  of coordination  Attention and concentration deficit  Rationale for Evaluation and Treatment: Rehabilitation  PERTINENT HISTORY: PMHx includes L basal ganglia and corona radiata infarct 07/27/21, L lower homonymous quadrantanopia; R MCA infarct in 2006 w/ L-sided hemiparesis, NCD, carotid artery disease (bilateral), HTN, HLD,CKD3, vascular dementia, and hx of bladder cancer. He has had falls in 2023, fracturing his clavicle in August, then he had stomach surgery in Sept 2023.  He needs assist at baseline for ADLs. When last documented in acute rehab after having his gall-bladder removed, (03/09/22) he required moderate to maximum assist x2 for most ADLs (more assist for ADLs the require standing), and they recommended rehab in skilled nursing setting. He apparently got Covid at some point and also attended a SNF. Once he D/C home, he had some episodes of  combativeness at night  with "sun-downing."  He is communicative and appropriate, though appears easily confused and distracted, not fully oriented to time and place (Ax2, states date is 17 and cannot say what city he lives in). His wife provides most details, and he is participative, though a poor historian at times. In a nut shell- he has been suffering from dementia and post-stroke effects for years, and his issues were recently exacerbated in Sept of last year after abdominal surgery. He has less use of Lt "stroke side" and new, ritualistic "ticks" in mouth and Rt hand/arm.  Wife states that they do have a caregiver come M, W, F for 4 hours in the middle of the day to mainly help him bathe and dress, which usually tires him out. He has been more combative towards the end of the day as well.   PRECAUTIONS: Fall and Other: Lt hemi, poor cognition, LATEX and adhesive allergies, etc.  ;WEIGHT BEARING RESTRICTIONS: No   SUBJECTIVE:   SUBJECTIVE STATEMENT: Pt reports that he did his exercises this morning.    Pt accompanied by:  his wife   PAIN: no   FALLS: Has patient fallen in last 6 months? Yes. Number of falls 1 in last couple weeks and 2-3 near falls (backwards) when getting up from various height chairs   LIVING ENVIRONMENT: Lives with: lives with their spouse Lives in: House/apartment Has following equipment at home: manual w/c, 2 w/w, weighted utensils, shower bench (for tub), bed cane, hospital bed, raised toilet, and possibly more  PLOF: Needs assistance with ADLs, Needs assistance with homemaking, Needs assistance with gait, and Needs assistance with transfers  PATIENT GOALS: 11/30/22: to return to playing golf, increased attention (less distraction) to task to engage in ADLs and leisure pursuits, increase Lt arm usage   OBJECTIVE: (All objective assessments below are from initial evaluation on: 07/07/22 unless otherwise specified.)   HAND DOMINANCE: Right  ADLs: Overall  ADLs: Wife states decreased ability since hospitalization in Sept 23.  Transfers/ambulation related to ADLs: Min A transfer, CGA for mobility Eating: set up Grooming: Mod A- needs assist with oral care, shaving, but can comb his hair UB Dressing: Min A  LB Dressing: Dep  Toileting:  Mod  Bathing: Mod  Tub Shower transfers: Min A  Equipment: Emergency planning/management officer, Grab bars, and Feeding equipment  IADLs: completely dependent for years now, except some ability to use microwave which is now not possible after hospitalization   MOBILITY STATUS: Needs Assist: CGA - Min A, Hx of falls, Festination, difficulty with turns, and neglect on Lt side  POSTURE COMMENTS:  rounded shoulders, forward head, and weight shift left  ACTIVITY TOLERANCE: Activity tolerance:  he became tired and somewhat agitated by the end of the OT evaluation today (after PT and OT about 2.5hours with mostly sedate activities in seated positions)  FUNCTIONAL OUTCOME MEASURES: [facility] 17/30  showing problems with visuospatial/executive function, attention, fluency, and orientation. (at least 26/30 is needed to be considered "normal")  Trinna Balloon in ADLs: 1/6  (very dependent) 11/30/22: PPT#2: 21.79 sec  UPPER EXTREMITY ROM:  from initial eval  Active ROM Right eval Left eval Left 11/30/22  Shoulder scaption 100 85 85  Shoulder abduction   80  Shoulder adduction     Shoulder extension     Shoulder internal rotation Reaches small of back Reaches to hip Reaches to hip  Shoulder external rotation Reaches back of head  Reaches up to ear Reaches up to ear  Elbow flexion 150 130   Elbow extension (-10*)  (-35*)   Wrist flexion 35 20   Wrist extension 50 10   (Blank rows = not tested)  UPPER EXTREMITY MMT:    Tested grossly as "push" and "pull": UE push 4-/5, UE pull 4/5  HAND FUNCTION: Grip strength: Right: 30 lbs; Left: 20 lbs  COORDINATION: Finger Nose Finger test: mildly ataxic but fairly accurate  Box and  Blocks:  Right 11blocks, Left 5blocks  MUSCLE TONE:  Rt- normal, Lt- decreased and worsened by inattention/disuse  COGNITION: Overall cognitive status: Impaired and Difficulty to assess due to: severity of deficits; Poor attention, fluency, orientation, and executive function. Recall is fairly good as well as abstraction.  Seems to have personality changes due to vascular dementia- like an OCD type volitional perseveration of tapping with Rt hand that he can somewhat control.  He did become angry and agitated at the end of the session performing car transfer with spouse: he did shout at her.  VISION: Subjective report: no new changes per pt/spouse, uses "cheaters"   VISION ASSESSMENT: Visual fields seem intact though he had latent responses on the left side showing likely inattention/and/neglect, tracking somewhat delayed horizontally and vertically  PERCEPTION: Impaired: Inattention/neglect: does not attend to left visual field and does not attend to left side of body, Body scheme: poor quality of recognition with shaving, etc., and Spatial orientation: decreased in print and in orientation, especially to Lt side  PRAXIS: Not tested specifically but no overt/stated issues using pen, wearing glasses ,etc.   OBSERVATIONS: Pt taps and flicks and does seem to volitionally perseverate on making a rhythm/noise with Rt hand. He states he is aware of it and chooses to do it.  He does also seem to have mild resting and intention tremor.    TODAY'S TREATMENT:     12/08/22 Large amplitude: engaged in reaching in standing while incorporating stepping pattern to poles with colored scarves to challenge cognitive attention with functional reach.  Pt scanning to L to locate scarves on L with min increased time, however frequently reaching with RUE across midline over reaching with LUE.  OT providing cues to reach with LUE with pt only able to carryover to next reach but unable to progress beyond 1x without  additional cues.  Encouraged family to complete in standing at counter top for UE support and alternating stepping and reaching to target on cabinet.  OT instructed in forward reach with elbow extension and finger flicks, however pt with decreased amplitude on L side, therefore modified task to complete with BUE in clasped position and then with dowel to facilitate increased shoulder ROM.  Pt responding to use of target  with Nurse, children's for auditory feedback with movements. NMR: Engaged in reaching/kicking with opposite hand and leg ("dead bug" in sitting) with pt demonstrating ability to complete with min difficulty with LLE and RUE, and min-mod difficulty with RLE and LUE.  Pt unable to alternate but able to maintain single side for multiple repetitions and then switch to other R/L combination.  OT providing intermittent verbal, tactile, and visual cues for quality of movement and sequencing.   12/07/22 Engaged in table top task for attention and bimanual task progressing from simple stacking activity to pattern replication.  Pt initially picking up large blocks with R hand, however with tactile cues to cover R hand pt able to stack and sort by color with L hand.  Pt requiring increased time and cues to visually scan to L to locate targets in L visual field.  Progressed to attempting colored peg pattern replication with peg board placed at midline.  Pt placing pegs only on R side of board with min adherence to pattern with some portions being similar in nature but with no clear replication.  Transitioned to visual attention with newspaper scavenger hunt.  Pt initially flipping through newspaper with inability to locate article on L side of page.  Pt then encouraged to utilize table of contents with pt able to locate correct page and then flip until locating.  Min cues when pt arrived at 35 to locate pg 34 on L.  Pt then able to skim article and identify a few items with article placed at midline.  Pt then  scanning newspaper to locate used car section.  Pt then to locate mileage on cars with pt correctly identifying 75% of items.  Pt with intermittent sustained attention, as initially flipping back to used car page despite not being finished with sports article.     12/02/22 L shoulder ROM: engaged in shoulder shrugs, attempts at shoulder rolls backward, and towel slides with forward lean to facilitate increased shoulder ROM.  Engaged in chest press and attempts at overhead press with dowel with pt demonstrating decreasing ROM and amplitude of movement with repetition/ as he fatigued.  OT utilized mirror for visual feedback and intermittent tactile cues at L shoulder to further facilitate activation with shoulder shrugs and rolls.   NMR: Engaged in lateral scooting and alternating hip scoots forward/backward to facilitate carryover to movements as needed for sit > stand, bed mobility, and even LB dressing.  OT providing tactile cues and min facilitation at hips when alternating forward/backward scooting.  OT encouraged pt to attempt forward/backward scooting with isolated him movements in w/c to allow for more firm surface.  Instructed on w/c push ups to facilitate increased use of LUE with pushup and to facilitate anterior weight shift as needed for sit > stand.  Engaged in reaching/kicking with opposite hand and leg with pt demonstrating ability to complete with min difficulty with LLE and RUE, whereas mod-max difficulty with RLE and LUE.  Pt unable to alternate but able to maintain single side for multiple repetitions and then switch to other R/L combination.  OT providing intermittent cues for slow and controlled movement during each of the above exercises as pt with tendency to increase pace, compromising quality of movement.   11/30/22 Discussion in regards to pt/spouse goals to warrant continued occupational therapy services.  Since starting sinemet, pt with decreased tremors but now with increased  episodes of losing balance backwards especially when getting up from toilet/shower chair. Self-feeding: engaged in simulated self-feeding with  pt demonstrating good motor control and no tremors or repetitive tapping during task.  Completed PPT #2: 21.79 seconds.  Discussed attention component to task as pt requiring mod-max cues to attend to task and to recall instructions to scoop one bean at a time. UE ROM: OT providing PROM to L shoulder with focus on increased shoulder flexion as well as attempts at scapular retraction.  Pt tolerating ~95* PROM shoulder flexion but limited in shoulder girdle with attempts at PROM and AROM with scapular retraction.       PATIENT EDUCATION: Education details: ongoing condition specific education Person educated: Patient and Spouse Education method: Explanation, Demonstration, Tactile cues, and Verbal cues, handout Education comprehension: Explanation, demonstration, v.c  HOME EXERCISE PROGRAM: Access Code: [email or web address] URL: [email or web address] Date: 12/02/2022 Prepared by: [facility] Outpatient  Rehab - [facility]  Exercises - Seated Shoulder Flexion Towel Slide at Table Top  - 2 x daily - 10-15 reps - Seated Dowel Chest Press  - 2 x daily - 10-15 reps - Seated Shoulder Flexion AAROM with Dowel  - 2 x daily - 10-15 reps - Dead Bug (IN SITTING)  - 2 x daily - 10 reps     GOALS: Goals reviewed with patient? Yes   SHORT TERM GOALS: (STG required if POC>30 days) Target Date: 12/28/22   Pt will demo/state understanding of HEP with focus on LUE ROM. Goal status: IN PROGRESS  2.  Pt will consistently sustain focus to a simple functional task  in a minimally distracting environment x 6 mins with no more than 1 re-direct(ie: eating, functional task in clinic) Goal status: IN PROGRESS    LONG TERM GOALS: Target Date: 01/25/23  Pt will improve functional ability by decreased impairment per Trinna Balloon assessment from 1/6  to 2/6 or better, for better quality of life. Goal status:  IN PROGRESS  2.  He will show better functional use of left arm by improved box and blocks Test score from 5 blocks to at least 10 blocks. Baseline: 8 blocks on 11/16/22 Goal status: IN PROGRESS  3. Pt will don a flannel button front shirt consistently with min A and min v.c Baseline: mod A with mod vc Goal status:  IN PROGRESS  4.  Pt will use his LUE as an active assist for ADLS/leisure tasks grossly 40% of the time with min v.c Baseline: uses 25% Goal status:  IN PROGRESS  5.  Pt will open the newspaper and locate sports and used car section with no more than min v.c                           Goal status:   IN PROGRESS  6.  Pt will be able to get up/down from toilet with supervision and no cues.  Goal status: IN PROGRESS  7.  Pt will demonstrate improved sustained attention to complete breakfast in <1 hour  Baseline: currently requiring ~2 hours to complete eating breakfast Goal status: IN PROGRESS  ASSESSMENT:  CLINICAL IMPRESSION: Pt continues to demonstrate decreased spontaneous use of LUE during dynamic reaching activity and with seated exercises. OT utilizing mulitmodal cues as pt responding to visual cues and auditory sounds for increased feedback. Pt with improved visual scanning to L environment during dynamic activities, however still benefiting from tactile and verbal cues to incorporate LUE into tasks. Door of treatment room remained open to allow for some outside distractions, with pt looking out door intermittently, therefore door  closed during more challenge bilateral "dead bug" task with minimal improvement in attention with task.. OT primarily placed in front of pt which may have encouraged maintained attention to tasks.   OT Frequency/DURATION: 2x week x 8 weeks   PLANNED INTERVENTIONS: self care/ADL training, therapeutic exercise, therapeutic activity, neuromuscular re-education, manual therapy, balance  training, functional mobility training, moist heat, cryotherapy, patient/family education, cognitive remediation/compensation, visual/perceptual remediation/compensation, psychosocial skills training, energy conservation, coping strategies training, DME and/or AE instructions, and Re-evaluation  RECOMMENDED OTHER SERVICES: He has already seen PT for balance and functional mobility, he would probably benefit from speech for cognitive issues as well, though OT will be working with cognition somewhat functionally now.  CONSULTED AND AGREED WITH PLAN OF CARE: Patient and family member/caregiver  PLAN FOR NEXT SESSION: Review and add to HEP for L shoulder ROM, table top task for attention   Kermitt Harjo, OTR/L 3:41 PM 12/08/22

## 2022-12-09 ENCOUNTER — Ambulatory Visit: Payer: PPO | Admitting: Physical Therapy

## 2022-12-09 ENCOUNTER — Encounter: Payer: Self-pay | Admitting: Physical Therapy

## 2022-12-09 DIAGNOSIS — R2689 Other abnormalities of gait and mobility: Secondary | ICD-10-CM

## 2022-12-09 DIAGNOSIS — M6281 Muscle weakness (generalized): Secondary | ICD-10-CM

## 2022-12-09 NOTE — Therapy (Signed)
OUTPATIENT PHYSICAL THERAPY NEURO TREATMENT NOTE  Patient Name: William [name] MRN: [id number] DOB:12/12/1940, 82 y.o., male Today's Date: 12/10/2022  PCP: Donato Schultz  DO REFERRING PROVIDER: same   END OF SESSION:  PT End of Session - 12/09/22 1446     Visit Number 42    Number of Visits 54    Date for PT Re-Evaluation 01/22/23    Authorization Type HTA    Progress Note Due on Visit 50    PT Start Time 1448    PT Stop Time 1530    PT Time Calculation (min) 42 min    Equipment Utilized During Treatment Gait belt    Activity Tolerance Patient tolerated treatment well    Behavior During Therapy WFL for tasks assessed/performed              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   HTN (hypertension) 07/19/2015   Hyperlipidemia    Major neurocognitive disorder 01/09/2014   Mild, related to stroke history   Nocturia    Renal insufficiency 06/25/2013   S/P carotid endarterectomy    BILATERAL ICA--  PATENT PER DUPLEX  05-19-2012   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   Right calf   Urinary frequency    Vitamin D deficiency    Past Surgical History:  Procedure Laterality Date   APPENDECTOMY  AS CHILD   CARDIOVASCULAR STRESS TEST  03-27-2012  DR CRENSHAW   LOW RISK LEXISCAN STUDY-- PROBABLE NORMAL PERFUSION AND SOFT TISSUE ATTENUATION/  NO ISCHEMIA/ EF 51%   CAROTID ENDARTERECTOMY Bilateral LEFT  11-12-2008  DR GREG HAYES   RIGHT ICA  2006  ([facility])   CHOLECYSTECTOMY N/A 02/23/2022   Procedure: LAPAROSCOPIC CHOLECYSTECTOMY;  Surgeon:  Quentin Ore, MD;  Location: [facility] OR;  Service: General;  Laterality: N/A;   CYSTOSCOPY W/ RETROGRADES Bilateral 06/22/2021   Procedure: CYSTOSCOPY WITH RETROGRADE PYELOGRAM;  Surgeon: Marcine Matar, MD;  Location: [facility] [place];  Service: Urology;  Laterality: Bilateral;   CYSTOSCOPY WITH LITHOLAPAXY N/A 02/26/2013   Procedure: CYSTOSCOPY WITH LITHOLAPAXY;  Surgeon: Marcine Matar, MD;  Location: [facility];  Service: Urology;  Laterality: N/A;   ENDOSCOPIC RETROGRADE CHOLANGIOPANCREATOGRAPHY (ERCP) WITH PROPOFOL N/A 02/22/2022   Procedure: ENDOSCOPIC RETROGRADE CHOLANGIOPANCREATOGRAPHY (ERCP) WITH PROPOFOL;  Surgeon: Jeani Hawking, MD;  Location: [facility] ENDOSCOPY;  Service: Gastroenterology;  Laterality: N/A;   EYE SURGERY  Jan. 2016   cataract surgery both eyes   INGUINAL HERNIA REPAIR Right 11-08-2006   IR KYPHO EA ADDL LEVEL THORACIC OR LUMBAR  02/12/2021   IR RADIOLOGIST EVAL & MGMT  02/18/2021   MASS EXCISION N/A 03/03/2016   Procedure: EXCISION OF BACK  MASS;  Surgeon: Almond Lint, MD;  Location: [facility];  Service: General;  Laterality: N/A;   MOHS SURGERY Left 1/ 2016   Dr Margo Aye-- Basal cell   PROSTATE SURGERY     REMOVAL OF STONES  02/22/2022   Procedure: REMOVAL OF STONES;  Surgeon: Elnoria Howard,  Luisa Hart, MD;  Location: [facility] ENDOSCOPY;  Service: Gastroenterology;;   Dennison Mascot  02/22/2022   Procedure: Dennison Mascot;  Surgeon: Jeani Hawking, MD;  Location: [facility];  Service: Gastroenterology;;   TRANSURETHRAL RESECTION OF BLADDER TUMOR WITH MITOMYCIN-C N/A 06/22/2021   Procedure: TRANSURETHRAL RESECTION OF BLADDER TUMOR;  Surgeon: Marcine Matar, MD;  Location: [facility];  Service: Urology;  Laterality: N/A;   TRANSURETHRAL RESECTION OF PROSTATE N/A 02/26/2013   Procedure: TRANSURETHRAL RESECTION OF THE PROSTATE WITH GYRUS INSTRUMENTS;  Surgeon: Marcine Matar, MD;  Location: [facility];  Service:  Urology;  Laterality: N/A;   TRANSURETHRAL RESECTION OF PROSTATE N/A 06/22/2021   Procedure: TRANSURETHRAL RESECTION OF THE PROSTATE (TURP);  Surgeon: Marcine Matar, MD;  Location[facility];  Service: Urology;  Laterality: N/A;   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   Insomnia    Prediabetes    Acute 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 (HCC)    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   Transaminitis 07/27/2021   UTI (urinary tract infection) 07/27/2021   CVA (cerebral vascular accident) (HCC) 07/27/2021   Fall 07/27/2021   Hyperglycemia 07/27/2021   Cholelithiasis 07/27/2021   Hypoxia 07/27/2021   Nausea and vomiting 07/27/2021   Acute metabolic encephalopathy 07/27/2021   Normocytic anemia 07/27/2021   Chronic back pain 07/27/2021   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   Closed fracture of first lumbar vertebra with routine healing 02/03/2021   Closed fracture of multiple ribs 11/18/2020   Anxiety 01/29/2020   Leg pain, bilateral 01/29/2020   Ingrown toenail 07/13/2019   Lumbar spondylosis 05/02/2018   Pain in left knee 03/09/2018   Osteoarthritis of left hip 01/16/2018   Trochanteric bursitis of left hip 01/16/2018   Preventative health care 09/26/2017   HTN (hypertension) 07/19/2015   Hyperlipidemia 07/19/2015   Great  toe pain 02/11/2014   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   Obesity (BMI 30-39.9) 06/25/2013   Renal insufficiency 06/25/2013   Weakness of left arm 06/25/2013   Sebaceous cyst 03/03/2011   Sprain of lumbar region 07/31/2010   Rib pain, left 08/29/2009   Carotid artery stenosis, asymptomatic, bilateral 05/02/2009   Eczema, atopic 05/31/2008   Vitamin D deficiency 03/01/2008   BPH (benign prostatic hyperplasia) 08/06/2007   Fasting hyperglycemia 12/21/2006   History of right MCA infarct 2006    ONSET DATE: 06/29/22 REFERRING DIAG:  Diagnosis  I63.9 (ICD-10-CM) - Cerebrovascular accident (CVA), unspecified mechanism (HCC)  W19.XXXD (ICD-10-CM) - Fall, subsequent encounter    THERAPY DIAG:  Other abnormalities of gait and mobility  Muscle weakness (generalized)  Rationale for Evaluation and Treatment: Rehabilitation  SUBJECTIVE:  SUBJECTIVE STATEMENT:  Per wife, working at home-using the bumper sticks, kicking pool noodles  PERTINENT HISTORY:  MALEAK MCNEVIN is a 82 year old man with dementia, CKD, HTN, CAD, HLD and history of CVA  (2006, 2023), Parkinson's  PAIN:  Are you having pain? No  PRECAUTIONS: Fall  WEIGHT BEARING RESTRICTIONS: No  FALLS: Has patient fallen in last 6 months? No  LIVING ENVIRONMENT: Lives with: lives with their family and lives with their spouse Lives in: House/apartment Stairs:  1 brick high step Has following equipment at home: Environmental consultant - 2 wheeled, Wheelchair (manual), Shower bench, bed side commode, Grab bars, and hospital bed, sliding pad for car  PLOF: Independent with basic ADLs and Independent with household mobility without device  PATIENT GOALS: Walk around the block with his wife. Be able to use the dining room chair rather than have to use  the W/C. Increased I with bed mobility and simple tasks at home like make some coffee or brush his teeth, Step over tub to shower, has bench, but he can't really use it. Up and down the one step to get to back deck. 11/23/22: Patient's wife [name] updated his goals today: Walk around store (gets distracted), swing a golf club again (thinking of driving range), get up on his own, walk without leaning forward, more recognition of the left side (from CVA), not need gait belt, OT-- be able to get dressed more on his own, navigate the newspaper.  OBJECTIVE:  (Measures in this section from initial evaluation unless otherwise noted) DIAGNOSTIC FINDINGS:  MRI 2021 with degenerative changes in lumbar spine, L3-4 R subarticular stenosis COGNITION: Overall cognitive status: History of cognitive impairments - at baseline SENSATION: Not tested COORDINATION: Moderately impaired BLE MUSCLE LENGTH: Hamstrings: severely restricted B Thomas test: Severely restricted B. POSTURE: rounded shoulders, forward head, increased thoracic kyphosis, posterior pelvic tilt, and flexed trunk  LOWER EXTREMITY ROM:   BLE extremely stiff throughout, limited hip ROM in all planes, B knees limited in extension. LOWER EXTREMITY MMT:  3+/5 throughout. Unable to determine accuracy of MMT due to cognition. Functionally demosntrates poor coordinated activation and poor muscular endurance. BED MOBILITY: Min A for Supine<> Sit. Patient was using a ladder in his hospital bed and could move MI. TRANSFERS: Min A, mod VC, tends to lean back.  11/02/22 independent with use of hands on arm chair with a couple of tries CURB: Min A-Has one step out to his deck. STAIRS: N/A  GAIT: Gait pattern: step to pattern, decreased arm swing- Right, decreased arm swing- Left, decreased step length- Right, decreased step length- Left, shuffling, festinating, trunk flexed, narrow BOS, poor foot clearance- Right, and poor foot clearance- Left Distance walked:  31' Assistive device utilized: Environmental consultant - 2 wheeled Level of assistance: Occasional min A Comments: mod VC to stay close to walker, take longer steps. FUNCTIONAL TESTS:  5 times sit to stand: 38.23  09/29/22  could not performs 5XSTS, 11/02/22 = 47 seconds with cues to use arms Timed up and go (TUG): 47.91  with RW, 07/21/22 TUG 29  seconds without device, 09/29/22 25 seconds, 11/02/22 TUG 20 seconds   [facility] Adult PT Treatment:                                                DATE: 12/09/22 Neuromuscular re-ed:  Short distance gait and turns (  TUG shuttle 15 ft), multiple reps, working on weightshift to fully turn around in preparation to sit Sit to stand from mat,  with initial forward reach to targets, then work to push up from mat surface Sit EOM and reach across with RUE, weightbearing through LUE Forward/back walking to work on full turn/full back up to chair; forward/back walking, followed by sidestepping, with HHA and therapist in front of pt, to improve fully backing up to prepare to sit Bean bag toss, with L step and weightshift (diagonal and posterior step to engage L posterior step), 12 reps                                                                            PATIENT EDUCATION: Education details: ways to encourage full turn to sit Person educated: Patient and Spouse Education method: Explanation (wife wrote down in her notebook) Education comprehension: verbalized understanding  HOME EXERCISE PROGRAM: TBD  GOALS: Goals reviewed with patient? Yes  SHORT TERM GOALS: Target date: 12/22/22  Perform HEP for PWR! Moves with cues from family. Baseline: Pt's wife, [name], notes walking and hitting beach ball as main HEP. Goal status: REVISED   2. Decrease 5 x STS to < 35 sec to demonstrate improved functional strength  Baseline: 47 seconds on 5/21 per note  Goal status: REVISED   3. Perform sit <> stand transfers from average chair height with Supervision.  Baseline: CGA to min  A  Goal status: REVISED   LONG TERM GOALS: Target date: 01/22/23  Patient will perform HEP progression with caregiver assist. Baseline: has minimal HEP Goal status: REVISED  2.  Decrease 5 x STS to < 25 sec to demonstrate improved functional strength Baseline: 47 seconds on 5/21 per note Goal status: REVISED  3. Improve quality of gait to be able to ambulate x 100 feet with supervision. Baseline: CGA with cues  Goal status: REVISED  4.  Patient will perform his bed mobility with MI including rolling, sit<>supine, and scooting in bed. Baseline: on hard surface mat he is independent but on soft bed at home he struggles, wife reports only needs a hand to get up now 11/02/22 Goal status: REVISED  5.  Patient will demonstrate floor<>stand with CGA and verbal cues for patient's wife to be able to assist. Baseline:  Has to call 9-1-1 Goal status: NEW GOAL  ASSESSMENT:  CLINICAL IMPRESSION:  Skilled PT session today with functional activities directed at improved full turn to sit.  Pt tends to reach for armrest of chair and begin to sit prior to fully turning around.  Pt responds well to cues with therapist in front to not reach for chair and to weightshift to back up and feel chair.  Worked also on initiating backwards step with LLE versus RLE, to bring LLE more into position.  Discussed with wife trying some of these options at home and will review how they went.    OBJECTIVE IMPAIRMENTS: Abnormal gait, decreased activity tolerance, decreased balance, decreased cognition, decreased coordination, decreased endurance, decreased mobility, difficulty walking, decreased ROM, decreased strength, decreased safety awareness, impaired flexibility, impaired UE functional use, improper body mechanics, and postural dysfunction.   PLAN:  PT FREQUENCY: 2x/week  PT DURATION:  12 weeks  PLANNED INTERVENTIONS: Therapeutic exercises, Therapeutic activity, Neuromuscular re-education, Balance training, Gait  training, Patient/Family education, Self Care, Joint mobilization, Stair training, Moist heat, and Manual therapy  PLAN FOR NEXT SESSION: Ask about how transfers went at home (full turn to sit) Gait cues to improve stride and posture. Large amplitude training (may begin in supine and prone in the clinic to potentially engage and load L UE -- if tolerates).    Vishnu Moeller W., PT 12/10/2022, 1:06 PM  [facility] [address], [address] [place], [place], [address] Phone: [phone]   Fax:  [phone]

## 2022-12-12 ENCOUNTER — Ambulatory Visit
Admission: EM | Admit: 2022-12-12 | Discharge: 2022-12-12 | Disposition: A | Discharge status: Home / Self Care | Payer: PPO | Attending: Nurse Practitioner | Admitting: Nurse Practitioner

## 2022-12-12 DIAGNOSIS — H6122 Impacted cerumen, left ear: Secondary | ICD-10-CM | POA: Diagnosis not present

## 2022-12-12 DIAGNOSIS — H6991 Unspecified Eustachian tube disorder, right ear: Secondary | ICD-10-CM | POA: Diagnosis not present

## 2022-12-12 MED ORDER — FLUTICASONE PROPIONATE 50 MCG/ACT NA SUSP
1.0000 | Freq: Every day | NASAL | 0 refills | Status: DC
Start: 2022-12-12 — End: 2023-04-18

## 2022-12-12 NOTE — Discharge Instructions (Addendum)
Start Flonase daily to help drain the fluid from behind your left eardrum.  Please follow-up with your PCP if your symptoms or not improving.  Please go to the ER for any worsening symptoms.  I hope you feel better soon!

## 2022-12-12 NOTE — ED Triage Notes (Signed)
Pt reports right ear pain and hearing decrease in right era since this morning. Per family, she saw some blood in one of the ears this week,she can't remember which ear.

## 2022-12-12 NOTE — ED Provider Notes (Signed)
[facility]    CSN: [id number] Arrival date & time: 12/12/22  1019      History   Chief Complaint Chief Complaint  Patient presents with   Ear Fullness    He has an ear ache - Entered by patient    HPI William [name] is a 82 y.o. male presents for evaluation of ear pain.  Patient is accompanied by wife.  Patient reports today he awoke with right ear pain with some muffled hearing.  Denies drainage, fevers, URI symptoms.  He does not wear hearing aids use Q-tips, or earbuds.  Wife thinks she is also dried blood on his left ear but cannot be sure if it was in his right.  Patient denies any left ear pain.  No other concerns at this time.   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   Mild, related to stroke history   Nocturia    Renal insufficiency 06/25/2013   S/P carotid endarterectomy    BILATERAL ICA--  PATENT PER DUPLEX  05-19-2012   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   Right calf   Urinary frequency    Vitamin D deficiency     Patient Active Problem List   Diagnosis Date Noted   Dysuria 08/03/2022   Nonintractable headache 07/01/2022   Bilateral impacted cerumen 06/24/2022   Rash 06/15/2022   Postoperative ileus (HCC) 02/27/2022   Ileus, postoperative (HCC) 02/26/2022   Choledocholithiasis 02/19/2022   DNR (do not resuscitate) 02/19/2022   Left elbow pain 01/26/2022   Mid back pain on left side 01/26/2022   Rib pain  01/26/2022   Acute pain of left shoulder 11/12/2021   Leukocytes in urine 11/12/2021   Urinary frequency 11/12/2021   Thrush 10/08/2021   Hemiplegia, 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   Insomnia    Prediabetes    Acute 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 (HCC)    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   Transaminitis 07/27/2021   UTI (urinary tract infection) 07/27/2021   CVA (cerebral vascular accident) (HCC) 07/27/2021   Fall 07/27/2021   Hyperglycemia 07/27/2021   Cholelithiasis 07/27/2021   Hypoxia 07/27/2021   Nausea and vomiting 07/27/2021   Acute metabolic encephalopathy 07/27/2021   Normocytic anemia 07/27/2021   Chronic back pain 07/27/2021   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   Closed fracture of first lumbar vertebra with routine healing 02/03/2021   Closed fracture of multiple ribs 11/18/2020   Anxiety 01/29/2020   Leg pain, bilateral 01/29/2020   Ingrown toenail 07/13/2019   Lumbar spondylosis 05/02/2018   Pain in left knee 03/09/2018   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   Trochanteric bursitis of left hip 01/16/2018   Preventative health care 09/26/2017   HTN (hypertension) 07/19/2015   Hyperlipidemia 07/19/2015   Great toe pain 02/11/2014   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   Obesity (BMI 30-39.9) 06/25/2013   Renal insufficiency 06/25/2013   Weakness of left arm 06/25/2013   Sebaceous cyst 03/03/2011   Sprain of lumbar region 07/31/2010   Rib pain, left 08/29/2009   Carotid artery stenosis, asymptomatic, bilateral 05/02/2009   Eczema, atopic 05/31/2008   Vitamin D deficiency 03/01/2008   BPH (benign prostatic hyperplasia) 08/06/2007   Fasting hyperglycemia 12/21/2006   History of right MCA infarct 2006    Past Surgical History:  Procedure Laterality Date   APPENDECTOMY  AS CHILD   CARDIOVASCULAR STRESS TEST  03-27-2012  DR CRENSHAW    LOW RISK LEXISCAN STUDY-- PROBABLE NORMAL PERFUSION AND SOFT TISSUE ATTENUATION/  NO ISCHEMIA/ EF 51%   CAROTID ENDARTERECTOMY Bilateral LEFT  11-12-2008  DR GREG HAYES   RIGHT ICA  2006  ([facility])   CHOLECYSTECTOMY N/A 02/23/2022   Procedure: LAPAROSCOPIC CHOLECYSTECTOMY;  Surgeon: Quentin Ore, MD;  Location: [facility] OR;  Service: General;  Laterality: N/A;   CYSTOSCOPY W/ RETROGRADES Bilateral 06/22/2021   Procedure: CYSTOSCOPY WITH RETROGRADE PYELOGRAM;  Surgeon: Marcine Matar, MD;  Location: [facility];  Service: Urology;  Laterality: Bilateral;   CYSTOSCOPY WITH LITHOLAPAXY N/A 02/26/2013   Procedure: CYSTOSCOPY WITH LITHOLAPAXY;  Surgeon: Marcine Matar, MD;  Location: [facility]  Service: Urology;  Laterality: N/A;   ENDOSCOPIC RETROGRADE CHOLANGIOPANCREATOGRAPHY (ERCP) WITH PROPOFOL N/A 02/22/2022   Procedure: ENDOSCOPIC RETROGRADE CHOLANGIOPANCREATOGRAPHY (ERCP) WITH PROPOFOL;  Surgeon: Jeani Hawking, MD;  Location[facility] ENDOSCOPY;  Service: Gastroenterology;  Laterality: N/A;   EYE SURGERY  Jan. 2016   cataract surgery both eyes   INGUINAL HERNIA REPAIR Right 11-08-2006   IR KYPHO EA ADDL LEVEL THORACIC OR LUMBAR  02/12/2021   IR RADIOLOGIST EVAL & MGMT  02/18/2021   MASS EXCISION N/A 03/03/2016   Procedure: EXCISION OF BACK  MASS;  Surgeon: Almond Lint, MD;  Location: [facility];  Service: General;  Laterality: N/A;   MOHS SURGERY Left 1/ 2016   Dr Margo Aye-- Basal cell   PROSTATE SURGERY     REMOVAL OF STONES  02/22/2022   Procedure: REMOVAL OF STONES;  Surgeon: Jeani Hawking, MD;  Location: [facility] ENDOSCOPY;  Service: Gastroenterology;;   Dennison Mascot  02/22/2022   Procedure: Dennison Mascot;  Surgeon: Jeani Hawking, MD;  Location: [facility] ENDOSCOPY;  Service: Gastroenterology;;   TRANSURETHRAL RESECTION OF BLADDER TUMOR WITH MITOMYCIN-C N/A 06/22/2021   Procedure: TRANSURETHRAL RESECTION OF BLADDER TUMOR;  Surgeon: Marcine Matar, MD;   Location: [facility];  Service: Urology;  Laterality: N/A;   TRANSURETHRAL RESECTION OF PROSTATE N/A 02/26/2013   Procedure: TRANSURETHRAL RESECTION OF THE PROSTATE WITH GYRUS INSTRUMENTS;  Surgeon: Marcine Matar, MD;  Location: [facility];  Service: Urology;  Laterality: N/A;   TRANSURETHRAL RESECTION OF PROSTATE N/A 06/22/2021   Procedure: TRANSURETHRAL RESECTION OF THE PROSTATE (TURP);  Surgeon: Marcine Matar, MD;  Location: [facility];  Service: Urology;  Laterality: N/A;       Home Medications    Prior to Admission medications   Medication Sig Start Date End Date Taking? Authorizing Provider  fluticasone (FLONASE) 50 MCG/ACT nasal spray Place 1 spray into both nostrils daily. 12/12/22  Yes Radford Pax, NP  acetaminophen (TYLENOL) 325 MG tablet Take 2 tablets (650 mg total) by mouth every 6 (six) hours as needed for mild pain. 02/24/22   Eric Form, PA-C  amantadine (SYMMETREL) 100 MG capsule Take 1 capsule (100 mg total) by mouth daily.  09/28/22   Seabron Spates R, DO  amLODipine (NORVASC) 10 MG tablet Take 1 tablet (10 mg total) by mouth every morning. 07/13/22   Donato Schultz, DO  carBAMazepine (TEGRETOL) 100 MG/5ML suspension Take 5 mLs (100 mg total) by mouth in the morning and in the afternoon, and then 10 mLs (200 mg total) at bedtime. 10/26/22   Plovsky, Earvin Hansen, MD  carbidopa-levodopa (SINEMET IR) 25-100 MG tablet Take 1/2 tablet twice daily for one week, then 1/2 tablet three times daily for one week, then 1 tablet three times daily. 11/09/22   Drema Dallas, DO  Cholecalciferol (VITAMIN D3) 25 MCG (1000 UT) CHEW Chew 1 tablet by mouth daily.    [provider]  clobetasol cream (TEMOVATE) 0.05 % Apply 1 Application to affected areas topically up to 2 (two) times daily as needed. Do not apply to face, groin, or under arms 11/02/22   Nita Sells, MD  clopidogrel (PLAVIX) 75 MG tablet Take 1 tablet (75 mg  total) by mouth daily. 09/13/22   Donato Schultz, DO  clotrimazole-betamethasone (LOTRISONE) cream Apply 1 Application on to the skin daily. 12/10/21   Seabron Spates R, DO  ferrous sulfate 325 (65 FE) MG EC tablet Take 325 mg by mouth 2 (two) times daily. *Crushed*    [provider]  fluticasone (CUTIVATE) 0.05 % cream 1 application  daily as needed (psoriasis). 12/16/16   [provider]  hydrALAZINE (APRESOLINE) 25 MG tablet Take 1 tablet (25 mg total) by mouth every 8 (eight) hours. 09/16/22 03/18/23  Donato Schultz, DO  LORazepam (ATIVAN) 0.5 MG tablet Take 0.5 tablet (0.25 mg total) by mouth in the morning, then 1 tablet (0.5 mg total) by mouth in the afternoon, and the 2 tablets (2 mg total) by mouth at bedtime. 10/26/22   Plovsky, Earvin Hansen, MD  Multiple Vitamins-Minerals (ADULT ONE DAILY GUMMIES) CHEW Chew 1 tablet by mouth every morning.    [provider]  pantoprazole (PROTONIX) 40 MG tablet Take 1 tablet (40 mg total) by mouth daily. 11/15/22   Donato Schultz, DO  polyethylene glycol (MIRALAX / GLYCOLAX) 17 g packet Take 17 g by mouth daily. 10/02/21   Pricilla Loveless, MD  tamsulosin (FLOMAX) 0.4 MG CAPS capsule Take 1 capsule (0.4 mg total) by mouth daily after supper. 10/18/22   Donato Schultz, DO  tiZANidine (ZANAFLEX) 4 MG tablet Take 1 tablet (4 mg total) by mouth every 6 (six) hours as needed for muscle spasms. 07/01/22   Donato Schultz, DO  traZODone (DESYREL) 150 MG tablet Take 0.5 tablets (75 mg total) by mouth at bedtime. *Crushed* 10/26/22   Plovsky, Earvin Hansen, MD  triamcinolone cream (KENALOG) 0.1 % Apply 1 application topically 2 (two) times daily. 11/15/22   Donato Schultz, DO    Family History Family History  Problem Relation Age of Onset   Heart disease Mother        CHF   Bipolar disorder Mother    Heart disease Father        CHF    Social History Social History   Tobacco Use   Smoking status: Former     Packs/day: 2.00    Years: 40.00    Additional pack years: 0.00    Total pack years: 80.00    Types: Cigarettes    Quit date: 02/15/2005    Years since quitting: 17.8   Smokeless tobacco: Never  Vaping Use  Vaping Use: Never used  Substance Use Topics   Alcohol use: Not Currently    Comment: Occasional   Drug use: No     Allergies   Bee venom, Strawberry extract, Latex, Zetia [ezetimibe], Adhesive [tape], and Statins   Review of Systems Review of Systems  HENT:  Positive for ear pain.      Physical Exam Triage Vital Signs ED Triage Vitals  Enc Vitals Group     BP 12/12/22 1032 (!) 150/77     Pulse Rate 12/12/22 1032 73     Resp 12/12/22 1032 18     Temp 12/12/22 1032 98.1 F (36.7 C)     Temp Source 12/12/22 1026 Oral     SpO2 12/12/22 1032 96 %     Weight --      Height --      Head Circumference --      Peak Flow --      Pain Score 12/12/22 1030 7     Pain Loc --      Pain Edu? --      Excl. in GC? --    No data found.  Updated Vital Signs BP (!) 150/77 (BP Location: Right Arm)   Pulse 73   Temp 98.1 F (36.7 C) (Oral)   Resp 18   SpO2 96%   Visual Acuity Right Eye Distance:   Left Eye Distance:   Bilateral Distance:    Right Eye Near:   Left Eye Near:    Bilateral Near:     Physical Exam Vitals and nursing note reviewed.  Constitutional:      General: He is not in acute distress.    Appearance: Normal appearance. He is not ill-appearing.  HENT:     Head: Normocephalic and atraumatic.     Right Ear: No swelling or tenderness. A middle ear effusion is present. There is no impacted cerumen. No foreign body. No mastoid tenderness. Tympanic membrane is not erythematous.     Left Ear: There is impacted cerumen.     Ears:     Comments: After irrigation left TM visible.  Canal slightly inflamed but not swollen or with drainage Eyes:     Pupils: Pupils are equal, round, and reactive to light.  Cardiovascular:     Rate and Rhythm: Normal rate.   Pulmonary:     Effort: Pulmonary effort is normal.  Skin:    General: Skin is warm and dry.  Neurological:     General: No focal deficit present.     Mental Status: He is alert.  Psychiatric:        Mood and Affect: Mood normal.        Behavior: Behavior normal.      UC Treatments / Results  Labs (all labs ordered are listed, but only abnormal results are displayed) Labs Reviewed - No data to display  EKG   Radiology No results found.  Procedures Procedures (including critical care time)  Medications Ordered in UC Medications - No data to display  Initial Impression / Assessment and Plan / UC Course  I have reviewed the triage vital signs and the nursing notes.  Pertinent labs & imaging results that were available during my care of the patient were reviewed by me and considered in my medical decision making (see chart for details).     Reviewed exam and symptoms with patient and wife.  No red flags.  Left TM visible and within normal limits.  Some wax remains  in canal but will stop given the irritation of the canal and patient tolerance.  Discussed eustachian tube dysfunction.  Start Flonase.  Patient to follow-up with PCP if symptoms do not improve.  ER precautions reviewed and patient and wife verbalized understanding. Final Clinical Impressions(s) / UC Diagnoses   Final diagnoses:  Impacted cerumen of left ear  Dysfunction of right eustachian tube     Discharge Instructions      Start Flonase daily to help drain the fluid from behind your left eardrum.  Please follow-up with your PCP if your symptoms or not improving.  Please go to the ER for any worsening symptoms.  I hope you feel better soon!     ED Prescriptions     Medication Sig Dispense Auth. Provider   fluticasone (FLONASE) 50 MCG/ACT nasal spray Place 1 spray into both nostrils daily. 15.8 mL Radford Pax, NP      PDMP not reviewed this encounter.   Radford Pax, NP 12/12/22 [phone]

## 2022-12-12 NOTE — Progress Notes (Signed)
Subjective: Chief Complaint  Patient presents with   Nail Problem    Routine foot care     82 y.o. returns the office today for painful, elongated, thickened toenails which he cannot trim himself.  His toenails are sensitive.  Denies any drainage or pus from the sternal sites.  No open lesions.  No other concerns today.  He is on Plavix.  PCP: Donato Schultz, DO Last seen May 28, 2022  Objective: NAD, presents with his wife DP/PT pulses palpable, CRT less than 3 seconds  Nails hypertrophic, dystrophic, elongated, brittle, discolored x 10.  Incurvation of the left hallux toenail without any edema, erythema or signs of infection.  There is tenderness overlying the nails 1-5 bilaterally.  No swelling around the nails, redness or drainage or any signs of infection. Hammertoes present No open lesions No pain with calf compression, swelling, warmth, erythema.  Assessment: Patient presents with symptomatic onychomycosis  Plan: -Treatment options including alternatives, risks, complications were discussed -Nails debrided x 10 without any complications or bleeding.  Ingrown toenails been doing well at this time.  Monitor for any signs or symptoms of infection or pain associated with this.  -Continue daily dressing changes for the abrasion.  Monitoring signs or symptoms of infection.  Return in about 3 months (around 03/09/2023).  Ovid Curd, DPM

## 2022-12-14 ENCOUNTER — Encounter: Payer: Self-pay | Admitting: Physical Therapy

## 2022-12-14 ENCOUNTER — Ambulatory Visit: Payer: PPO | Attending: Family Medicine | Admitting: Physical Therapy

## 2022-12-14 ENCOUNTER — Ambulatory Visit: Payer: PPO | Admitting: Occupational Therapy

## 2022-12-14 DIAGNOSIS — R41840 Attention and concentration deficit: Secondary | ICD-10-CM | POA: Diagnosis not present

## 2022-12-14 DIAGNOSIS — R278 Other lack of coordination: Secondary | ICD-10-CM | POA: Insufficient documentation

## 2022-12-14 DIAGNOSIS — M6281 Muscle weakness (generalized): Secondary | ICD-10-CM | POA: Diagnosis not present

## 2022-12-14 DIAGNOSIS — M25512 Pain in left shoulder: Secondary | ICD-10-CM | POA: Insufficient documentation

## 2022-12-14 DIAGNOSIS — R262 Difficulty in walking, not elsewhere classified: Secondary | ICD-10-CM | POA: Insufficient documentation

## 2022-12-14 DIAGNOSIS — I69354 Hemiplegia and hemiparesis following cerebral infarction affecting left non-dominant side: Secondary | ICD-10-CM | POA: Insufficient documentation

## 2022-12-14 DIAGNOSIS — Z9181 History of falling: Secondary | ICD-10-CM | POA: Diagnosis not present

## 2022-12-14 DIAGNOSIS — R2689 Other abnormalities of gait and mobility: Secondary | ICD-10-CM | POA: Insufficient documentation

## 2022-12-14 DIAGNOSIS — I69318 Other symptoms and signs involving cognitive functions following cerebral infarction: Secondary | ICD-10-CM | POA: Insufficient documentation

## 2022-12-14 DIAGNOSIS — R296 Repeated falls: Secondary | ICD-10-CM | POA: Diagnosis not present

## 2022-12-14 NOTE — Therapy (Signed)
OUTPATIENT PHYSICAL THERAPY NEURO TREATMENT NOTE  Patient Name: William [name] MRN: [id number] DOB:[birth year], 82 y.o., male Today's Date: 12/14/2022  PCP: Donato Schultz  DO REFERRING PROVIDER: same   END OF SESSION:  PT End of Session - 12/14/22 1100     Visit Number 43    Number of Visits 54    Date for PT Re-Evaluation 01/22/23    Authorization Type HTA    Progress Note Due on Visit 50    PT Start Time 1102    PT Stop Time 1145    PT Time Calculation (min) 43 min    Equipment Utilized During Treatment Gait belt    Activity Tolerance Patient tolerated treatment well    Behavior During Therapy WFL for tasks assessed/performed               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   HTN (hypertension) 07/19/2015   Hyperlipidemia    Major neurocognitive disorder 01/09/2014   Mild, related to stroke history   Nocturia    Renal insufficiency 06/25/2013   S/P carotid endarterectomy    BILATERAL ICA--  PATENT PER DUPLEX  05-19-2012   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   Right calf   Urinary frequency    Vitamin D deficiency    Past Surgical History:  Procedure Laterality Date   APPENDECTOMY  AS CHILD   CARDIOVASCULAR STRESS TEST  03-27-2012  DR CRENSHAW   LOW RISK LEXISCAN STUDY-- PROBABLE NORMAL PERFUSION AND SOFT TISSUE ATTENUATION/  NO ISCHEMIA/ EF 51%   CAROTID ENDARTERECTOMY Bilateral LEFT  11-12-2008  DR GREG HAYES   RIGHT ICA  2006  ([facility])   CHOLECYSTECTOMY N/A 02/23/2022   Procedure: LAPAROSCOPIC CHOLECYSTECTOMY;  Surgeon:  Quentin Ore, MD;  Location: [facility];  Service: General;  Laterality: N/A;   CYSTOSCOPY W/ RETROGRADES Bilateral 06/22/2021   Procedure: CYSTOSCOPY WITH RETROGRADE PYELOGRAM;  Surgeon: Marcine Matar, MD;  Location: [facility];  Service: Urology;  Laterality: Bilateral;   CYSTOSCOPY WITH LITHOLAPAXY N/A 02/26/2013   Procedure: CYSTOSCOPY WITH LITHOLAPAXY;  Surgeon: Marcine Matar, MD;  Location: [facility];  Service: Urology;  Laterality: N/A;   ENDOSCOPIC RETROGRADE CHOLANGIOPANCREATOGRAPHY (ERCP) WITH PROPOFOL N/A 02/22/2022   Procedure: ENDOSCOPIC RETROGRADE CHOLANGIOPANCREATOGRAPHY (ERCP) WITH PROPOFOL;  Surgeon: Jeani Hawking, MD;  Location: [facility] ENDOSCOPY;  Service: Gastroenterology;  Laterality: N/A;   EYE SURGERY  Jan. 2016   cataract surgery both eyes   INGUINAL HERNIA REPAIR Right 11-08-2006   IR KYPHO EA ADDL LEVEL THORACIC OR LUMBAR  02/12/2021   IR RADIOLOGIST EVAL & MGMT  02/18/2021   MASS EXCISION N/A 03/03/2016   Procedure: EXCISION OF BACK  MASS;  Surgeon: Almond Lint, MD;  Location: [facility];  Service: General;  Laterality: N/A;   MOHS SURGERY Left 1/ 2016   Dr Margo Aye-- Basal cell   PROSTATE SURGERY     REMOVAL OF STONES  02/22/2022   Procedure: REMOVAL OF STONES;  Surgeon:  Jeani Hawking, MD;  Location: [facility] ENDOSCOPY;  Service: Gastroenterology;;   Dennison Mascot  02/22/2022   Procedure: Dennison Mascot;  Surgeon: Jeani Hawking, MD;  Location: [facility] ENDOSCOPY;  Service: Gastroenterology;;   TRANSURETHRAL RESECTION OF BLADDER TUMOR WITH MITOMYCIN-C N/A 06/22/2021   Procedure: TRANSURETHRAL RESECTION OF BLADDER TUMOR;  Surgeon: Marcine Matar, MD;  Location: [facility];  Service: Urology;  Laterality: N/A;   TRANSURETHRAL RESECTION OF PROSTATE N/A 02/26/2013   Procedure: TRANSURETHRAL RESECTION OF THE PROSTATE WITH GYRUS INSTRUMENTS;  Surgeon: Marcine Matar, MD;  Location: [facility];  Service:  Urology;  Laterality: N/A;   TRANSURETHRAL RESECTION OF PROSTATE N/A 06/22/2021   Procedure: TRANSURETHRAL RESECTION OF THE PROSTATE (TURP);  Surgeon: Marcine Matar, MD;  Location: [facility];  Service: Urology;  Laterality: N/A;   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   Insomnia    Prediabetes    Acute 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 (HCC)    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   Transaminitis 07/27/2021   UTI (urinary tract infection) 07/27/2021   CVA (cerebral vascular accident) (HCC) 07/27/2021   Fall 07/27/2021   Hyperglycemia 07/27/2021   Cholelithiasis 07/27/2021   Hypoxia 07/27/2021   Nausea and vomiting 07/27/2021   Acute metabolic encephalopathy 07/27/2021   Normocytic anemia 07/27/2021   Chronic back pain 07/27/2021   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   Closed fracture of first lumbar vertebra with routine healing 02/03/2021   Closed fracture of multiple ribs 11/18/2020   Anxiety 01/29/2020   Leg pain, bilateral 01/29/2020   Ingrown toenail 07/13/2019   Lumbar spondylosis 05/02/2018   Pain in left knee 03/09/2018   Osteoarthritis of left hip 01/16/2018   Trochanteric bursitis of left hip 01/16/2018   Preventative health care 09/26/2017   HTN (hypertension) 07/19/2015   Hyperlipidemia 07/19/2015   Great  toe pain 02/11/2014   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   Obesity (BMI 30-39.9) 06/25/2013   Renal insufficiency 06/25/2013   Weakness of left arm 06/25/2013   Sebaceous cyst 03/03/2011   Sprain of lumbar region 07/31/2010   Rib pain, left 08/29/2009   Carotid artery stenosis, asymptomatic, bilateral 05/02/2009   Eczema, atopic 05/31/2008   Vitamin D deficiency 03/01/2008   BPH (benign prostatic hyperplasia) 08/06/2007   Fasting hyperglycemia 12/21/2006   History of right MCA infarct 2006    ONSET DATE: 06/29/22 REFERRING DIAG:  Diagnosis  I63.9 (ICD-10-CM) - Cerebrovascular accident (CVA), unspecified mechanism (HCC)  W19.XXXD (ICD-10-CM) - Fall, subsequent encounter    THERAPY DIAG:  Other abnormalities of gait and mobility  Muscle weakness (generalized)  Rationale for Evaluation and Treatment: Rehabilitation  SUBJECTIVE:  SUBJECTIVE STATEMENT:  Has fluid in the ears, and so they gave [place] a nose spray.  Been walking everyday.  Wife trying to prompt him to walk backwards to fully sit, but often he goes ahead and sits as he was doing.  She reports he was able to squat to pick up object from the floor and stand back up without LOB and without assistance.  PERTINENT HISTORY:  TAMEEM HOCKENBURY is a 82 year old man with dementia, CKD, HTN, CAD, HLD and history of CVA  (2006, 2023), Parkinson's  PAIN:  Are you having pain? No  PRECAUTIONS: Fall  WEIGHT BEARING RESTRICTIONS: No  FALLS: Has patient fallen in last 6 months? No  LIVING ENVIRONMENT: Lives with: lives with their family and lives with their spouse Lives in: House/apartment Stairs:  1 brick high step Has following equipment at home: Environmental consultant - 2 wheeled, Wheelchair (manual), Shower bench, bed side commode, Grab bars, and  hospital bed, sliding pad for car  PLOF: Independent with basic ADLs and Independent with household mobility without device  PATIENT GOALS: Walk around the block with his wife. Be able to use the dining room chair rather than have to use the W/C. Increased I with bed mobility and simple tasks at home like make some coffee or brush his teeth, Step over tub to shower, has bench, but he can't really use it. Up and down the one step to get to back deck. 11/23/22: Patient's wife [name] updated his goals today: Walk around store (gets distracted), swing a golf club again (thinking of driving range), get up on his own, walk without leaning forward, more recognition of the left side (from CVA), not need gait belt, OT-- be able to get dressed more on his own, navigate the newspaper.  OBJECTIVE:  (Measures in this section from initial evaluation unless otherwise noted) DIAGNOSTIC FINDINGS:  MRI 2021 with degenerative changes in lumbar spine, L3-4 R subarticular stenosis COGNITION: Overall cognitive status: History of cognitive impairments - at baseline SENSATION: Not tested COORDINATION: Moderately impaired BLE MUSCLE LENGTH: Hamstrings: severely restricted B Thomas test: Severely restricted B. POSTURE: rounded shoulders, forward head, increased thoracic kyphosis, posterior pelvic tilt, and flexed trunk  LOWER EXTREMITY ROM:   BLE extremely stiff throughout, limited hip ROM in all planes, B knees limited in extension. LOWER EXTREMITY MMT:  3+/5 throughout. Unable to determine accuracy of MMT due to cognition. Functionally demosntrates poor coordinated activation and poor muscular endurance. BED MOBILITY: Min A for Supine<> Sit. Patient was using a ladder in his hospital bed and could move MI. TRANSFERS: Min A, mod VC, tends to lean back.  11/02/22 independent with use of hands on arm chair with a couple of tries CURB: Min A-Has one step out to his deck. STAIRS: N/A  GAIT: Gait pattern: step to  pattern, decreased arm swing- Right, decreased arm swing- Left, decreased step length- Right, decreased step length- Left, shuffling, festinating, trunk flexed, narrow BOS, poor foot clearance- Right, and poor foot clearance- Left Distance walked: 50' Assistive device utilized: Environmental consultant - 2 wheeled Level of assistance: Occasional min A Comments: mod VC to stay close to walker, take longer steps. FUNCTIONAL TESTS:  5 times sit to stand: 38.23  09/29/22  could not performs 5XSTS, 11/02/22 = 47 seconds with cues to use arms Timed up and go (TUG): 47.91  with RW, 07/21/22 TUG 29  seconds without device, 09/29/22 25 seconds, 11/02/22 TUG 20 seconds   [facility] Adult PT Treatment:  DATE: 12/14/22 Neuromuscular re-ed:  Seated forward lean to reach up to side with Boomwhackers Squat to touch floor, then stand up tall, reach over head, with Boomwhackers  Sit to stand from mat,  with assist to use LUE to push equally,cues for nose over toes then work to push up from [employer] walking at counter with RUE support, multi-modal cues for increased LLE step length posteriorly Standing LLE hip kicks posterior to Boomwhacker for audio cue for increased step length Forward hip/knee flexion to Boomwhacker for increased step height, pt with narrowed BOS  Worked on wider BOS, facing counter, BUE support (at times, he needs assist for LUE to maintain contact at counter)  -LLE as stance with R foot side step, then side step over obstacle/return to midline  -LLE backward kicks to Boomwhacker for increased hip extension  -LLE side step and over obstacle/return to midline  -cues to reset wider BOS-look at feet and return to wider BOS; cues for upright posture  Discussed that wider BOS for standing, and rocking/weightshifting can help pt to get out of "stuck" episodes                                                                           PATIENT  EDUCATION: Education details: See above-wider BOS-cueing pt to check and correct, upright posture to help weightshift and get out of "stuck" positions Person educated: Patient and Spouse Education method: Explanation, Demonstration, and Verbal cues  Education comprehension: verbalized understanding, returned demonstration, verbal cues required, and needs further education  HOME EXERCISE PROGRAM: TBD  GOALS: Goals reviewed with patient? Yes  SHORT TERM GOALS: Target date: 12/22/22  Perform HEP for PWR! Moves with cues from family. Baseline: Pt's wife, [name], notes walking and hitting beach ball as main HEP. Goal status: REVISED   2. Decrease 5 x STS to < 35 sec to demonstrate improved functional strength  Baseline: 47 seconds on 5/21 per note  Goal status: REVISED   3. Perform sit <> stand transfers from average chair height with Supervision.  Baseline: CGA to min A  Goal status: REVISED   LONG TERM GOALS: Target date: 01/22/23  Patient will perform HEP progression with caregiver assist. Baseline: has minimal HEP Goal status: REVISED  2.  Decrease 5 x STS to < 25 sec to demonstrate improved functional strength Baseline: 47 seconds on 5/21 per note Goal status: REVISED  3. Improve quality of gait to be able to ambulate x 100 feet with supervision. Baseline: CGA with cues  Goal status: REVISED  4.  Patient will perform his bed mobility with MI including rolling, sit<>supine, and scooting in bed. Baseline: on hard surface mat he is independent but on soft bed at home he struggles, wife reports only needs a hand to get up now 11/02/22 Goal status: REVISED  5.  Patient will demonstrate floor<>stand with CGA and verbal cues for patient's wife to be able to assist. Baseline:  Has to call 9-1-1 Goal status: NEW GOAL  ASSESSMENT:  CLINICAL IMPRESSION:  Continued to work on functional strengthening for improved sit<>stand transfers.  He continues to need reminder cues for LUE  placement and increased forward lean to initiate transfer.  Worked on standing activities  to incorporate posterior step and weightshift for improved backwards steps for full turn to safely sit.  Worked on large amplitude posture and stepping movements-with pt beginning to self-correct to wider BOS with cues to attend to foot position.  Wife present for education about foot position and posture for improved standing balance, especially for episodes where he might get "stuck".   OBJECTIVE IMPAIRMENTS: Abnormal gait, decreased activity tolerance, decreased balance, decreased cognition, decreased coordination, decreased endurance, decreased mobility, difficulty walking, decreased ROM, decreased strength, decreased safety awareness, impaired flexibility, impaired UE functional use, improper body mechanics, and postural dysfunction.   PLAN:  PT FREQUENCY: 2x/week  PT DURATION: 12 weeks  PLANNED INTERVENTIONS: Therapeutic exercises, Therapeutic activity, Neuromuscular re-education, Balance training, Gait training, Patient/Family education, Self Care, Joint mobilization, Stair training, Moist heat, and Manual therapy  PLAN FOR NEXT SESSION: Continue to work on transfers, short distance gait with full turn to sit) Gait cues to improve stride and posture. Large amplitude training; wide BOS for stepping, forward/back walking, sidestepping.  Amelya Mabry W., PT 12/14/2022, 11:48 AM  [facility] [address] [place], [place], [address] Phone: [phone]   Fax:  [phone]

## 2022-12-15 ENCOUNTER — Ambulatory Visit: Payer: PPO | Admitting: Occupational Therapy

## 2022-12-15 ENCOUNTER — Ambulatory Visit: Payer: PPO

## 2022-12-15 DIAGNOSIS — I69318 Other symptoms and signs involving cognitive functions following cerebral infarction: Secondary | ICD-10-CM

## 2022-12-15 DIAGNOSIS — M6281 Muscle weakness (generalized): Secondary | ICD-10-CM

## 2022-12-15 DIAGNOSIS — R2689 Other abnormalities of gait and mobility: Secondary | ICD-10-CM

## 2022-12-15 DIAGNOSIS — R41840 Attention and concentration deficit: Secondary | ICD-10-CM

## 2022-12-15 DIAGNOSIS — I69354 Hemiplegia and hemiparesis following cerebral infarction affecting left non-dominant side: Secondary | ICD-10-CM

## 2022-12-15 DIAGNOSIS — R278 Other lack of coordination: Secondary | ICD-10-CM

## 2022-12-15 NOTE — Therapy (Signed)
OUTPATIENT OCCUPATIONAL THERAPY TREATMENT NOTE  Patient Name: William [name] MRN: [id number] DOB:24-Apr-1941, 82 y.o., male Today's Date: 12/15/2022  PCP & REFERRING PROVIDER:  [name], [name], DO    END OF SESSION:  OT End of Session - 12/15/22 1452     Visit Number 35    Number of Visits 47    Date for OT Re-Evaluation 01/25/23    Authorization Type [employer]    Progress Note Due on Visit 40    OT Start Time 1448    OT Stop Time 1530    OT Time Calculation (min) 42 min    Activity Tolerance Patient tolerated treatment well    Behavior During Therapy WFL for tasks assessed/performed                                 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   HTN (hypertension) 07/19/2015   Hyperlipidemia    Major neurocognitive disorder 01/09/2014   Mild, related to stroke history   Nocturia    Renal insufficiency 06/25/2013   S/P carotid endarterectomy    BILATERAL ICA--  PATENT PER DUPLEX  05-19-2012   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/26/2017   Right calf   Urinary frequency    Vitamin D deficiency    Past Surgical History:  Procedure Laterality Date   APPENDECTOMY  AS CHILD   CARDIOVASCULAR STRESS TEST  03-27-2012  DR CRENSHAW   LOW RISK LEXISCAN STUDY-- PROBABLE NORMAL PERFUSION AND SOFT TISSUE ATTENUATION/  NO ISCHEMIA/ EF 51%   CAROTID ENDARTERECTOMY Bilateral LEFT  11-12-2008  DR GREG HAYES   RIGHT ICA  2006  ([facility])   CHOLECYSTECTOMY N/A 02/23/2022   Procedure: LAPAROSCOPIC CHOLECYSTECTOMY;   Surgeon: Quentin Ore, MD;  Location: [facility] OR;  Service: General;  Laterality: N/A;   CYSTOSCOPY W/ RETROGRADES Bilateral 06/22/2021   Procedure: CYSTOSCOPY WITH RETROGRADE PYELOGRAM;  Surgeon: Marcine Matar, MD;  Location: [facility] ;  Service: Urology;  Laterality: Bilateral;   CYSTOSCOPY WITH LITHOLAPAXY N/A 02/26/2013   Procedure: CYSTOSCOPY WITH LITHOLAPAXY;  Surgeon: Marcine Matar, MD;  Location: [facility];  Service: Urology;  Laterality: N/A;   ENDOSCOPIC RETROGRADE CHOLANGIOPANCREATOGRAPHY (ERCP) WITH PROPOFOL N/A 02/22/2022   Procedure: ENDOSCOPIC RETROGRADE CHOLANGIOPANCREATOGRAPHY (ERCP) WITH PROPOFOL;  Surgeon: Jeani Hawking, MD;  Location: [facility] ENDOSCOPY;  Service: Gastroenterology;  Laterality: N/A;   EYE SURGERY  Jan. 2016   cataract surgery both eyes   INGUINAL HERNIA REPAIR Right 11-08-2006   IR KYPHO EA ADDL LEVEL THORACIC OR LUMBAR  02/12/2021   IR RADIOLOGIST EVAL & MGMT  02/18/2021   MASS EXCISION N/A 03/03/2016   Procedure: EXCISION OF BACK  MASS;  Surgeon: Almond Lint, MD;  Location: [facility];  Service: General;  Laterality: N/A;   MOHS SURGERY Left 1/ 2016   Dr Margo Aye-- Basal cell   PROSTATE SURGERY  REMOVAL OF STONES  02/22/2022   Procedure: REMOVAL OF STONES;  Surgeon: Jeani Hawking, MD;  Location: [facility] ENDOSCOPY;  Service: Gastroenterology;;   Dennison Mascot  02/22/2022   Procedure: Dennison Mascot;  Surgeon: Jeani Hawking, MD;  Location: [facility] ENDOSCOPY;  Service: Gastroenterology;;   TRANSURETHRAL RESECTION OF BLADDER TUMOR WITH MITOMYCIN-C N/A 06/22/2021   Procedure: TRANSURETHRAL RESECTION OF BLADDER TUMOR;  Surgeon: Marcine Matar, MD;  Location: [facility];  Service: Urology;  Laterality: N/A;   TRANSURETHRAL RESECTION OF PROSTATE N/A 02/26/2013   Procedure: TRANSURETHRAL RESECTION OF THE PROSTATE WITH GYRUS INSTRUMENTS;  Surgeon: Marcine Matar, MD;  Location: [facility];   Service: Urology;  Laterality: N/A;   TRANSURETHRAL RESECTION OF PROSTATE N/A 06/22/2021   Procedure: TRANSURETHRAL RESECTION OF THE PROSTATE (TURP);  Surgeon: Marcine Matar, MD;  Location: [facility];  Service: Urology;  Laterality: N/A;   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   Insomnia    Prediabetes    Acute 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 (HCC)    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   Transaminitis 07/27/2021   UTI (urinary tract infection) 07/27/2021   CVA (cerebral vascular accident) (HCC) 07/27/2021   Fall 07/27/2021   Hyperglycemia 07/27/2021   Cholelithiasis 07/27/2021   Hypoxia 07/27/2021   Nausea and vomiting 07/27/2021   Acute metabolic encephalopathy 07/27/2021   Normocytic anemia 07/27/2021   Chronic back pain 07/27/2021   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   Closed fracture of first lumbar vertebra with routine healing 02/03/2021   Closed fracture of multiple ribs 11/18/2020   Anxiety 01/29/2020   Leg pain, bilateral 01/29/2020   Ingrown toenail 07/13/2019   Lumbar spondylosis 05/02/2018   Pain in left knee 03/09/2018   Osteoarthritis of left hip 01/16/2018   Trochanteric bursitis of left hip 01/16/2018   Preventative health care 09/26/2017   HTN (hypertension) 07/19/2015   Hyperlipidemia 07/19/2015    Great toe pain 02/11/2014   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   Obesity (BMI 30-39.9) 06/25/2013   Renal insufficiency 06/25/2013   Weakness of left arm 06/25/2013   Sebaceous cyst 03/03/2011   Sprain of lumbar region 07/31/2010   Rib pain, left 08/29/2009   Carotid artery stenosis, asymptomatic, bilateral 05/02/2009   Eczema, atopic 05/31/2008   Vitamin D deficiency 03/01/2008   BPH (benign prostatic hyperplasia) 08/06/2007   Fasting hyperglycemia 12/21/2006   History of right MCA infarct 2006    ONSET DATE: Acute exacerbation of chronic symptoms (most recent hospitalization 02/26/22 - 03/12/22, followed by SNF and home health)   REFERRING DIAG:  I63.9 (ICD-10-CM) - Cerebrovascular accident (CVA), unspecified mechanism (HCC)  W19.XXXD (ICD-10-CM) - Fall, subsequent encounter    THERAPY DIAG:  Other abnormalities of gait and mobility  Muscle weakness (generalized)  Hemiplegia and hemiparesis following cerebral infarction affecting left non-dominant side (HCC)  Other symptoms and signs involving cognitive  functions following cerebral infarction  Other lack of coordination  Attention and concentration deficit  Rationale for Evaluation and Treatment: Rehabilitation  PERTINENT HISTORY: PMHx includes L basal ganglia and corona radiata infarct 07/27/21, L lower homonymous quadrantanopia; R MCA infarct in 2006 w/ L-sided hemiparesis, NCD, carotid artery disease (bilateral), HTN, HLD,CKD3, vascular dementia, and hx of bladder cancer. He has had falls in 2023, fracturing his clavicle in August, then he had stomach surgery in Sept 2023.  He needs assist at baseline for ADLs. When last documented in acute rehab after having his gall-bladder removed, (03/09/22) he required moderate to maximum assist x2 for most ADLs (more assist for ADLs the require standing), and they recommended rehab in skilled nursing setting. He apparently got Covid at some point and also attended a SNF.  Once he D/C home, he had some episodes of combativeness at night  with "sun-downing."  He is communicative and appropriate, though appears easily confused and distracted, not fully oriented to time and place (Ax2, states date is 27 and cannot say what city he lives in). His wife provides most details, and he is participative, though a poor historian at times. In a nut shell- he has been suffering from dementia and post-stroke effects for years, and his issues were recently exacerbated in Sept of last year after abdominal surgery. He has less use of Lt "stroke side" and new, ritualistic "ticks" in mouth and Rt hand/arm.  Wife states that they do have a caregiver come M, W, F for 4 hours in the middle of the day to mainly help him bathe and dress, which usually tires him out. He has been more combative towards the end of the day as well.   PRECAUTIONS: Fall and Other: Lt hemi, poor cognition, LATEX and adhesive allergies, etc.  ;WEIGHT BEARING RESTRICTIONS: No   SUBJECTIVE:   SUBJECTIVE STATEMENT: Pt reports that his legs are sore from PT exercises yesterday.    Pt accompanied by:  his wife   PAIN: no   FALLS: Has patient fallen in last 6 months? Yes. Number of falls 1 in last couple weeks and 2-3 near falls (backwards) when getting up from various height chairs   LIVING ENVIRONMENT: Lives with: lives with their spouse Lives in: House/apartment Has following equipment at home: manual w/c, 2 w/w, weighted utensils, shower bench (for tub), bed cane, hospital bed, raised toilet, and possibly more  PLOF: Needs assistance with ADLs, Needs assistance with homemaking, Needs assistance with gait, and Needs assistance with transfers  PATIENT GOALS: 11/30/22: to return to playing golf, increased attention (less distraction) to task to engage in ADLs and leisure pursuits, increase Lt arm usage   OBJECTIVE: (All objective assessments below are from initial evaluation on: 07/07/22 unless otherwise  specified.)   HAND DOMINANCE: Right  ADLs: Overall ADLs: Wife states decreased ability since hospitalization in Sept 23.  Transfers/ambulation related to ADLs: Min A transfer, CGA for mobility Eating: set up Grooming: Mod A- needs assist with oral care, shaving, but can comb his hair UB Dressing: Min A  LB Dressing: Dep  Toileting:  Mod  Bathing: Mod  Tub Shower transfers: Min A  Equipment: Emergency planning/management officer, Grab bars, and Feeding equipment  IADLs: completely dependent for years now, except some ability to use microwave which is now not possible after hospitalization   MOBILITY STATUS: Needs Assist: CGA - Min A, Hx of falls, Festination, difficulty with turns, and neglect on Lt side  POSTURE COMMENTS:  rounded shoulders, forward head,  and weight shift left  ACTIVITY TOLERANCE: Activity tolerance: he became tired and somewhat agitated by the end of the OT evaluation today (after PT and OT about 2.5hours with mostly sedate activities in seated positions)  FUNCTIONAL OUTCOME MEASURES: [facility] 17/30  showing problems with visuospatial/executive function, attention, fluency, and orientation. (at least 26/30 is needed to be considered "normal")  Trinna Balloon in ADLs: 1/6  (very dependent) 11/30/22: PPT#2: 21.79 sec  UPPER EXTREMITY ROM:  from initial eval  Active ROM Right eval Left eval Left 11/30/22  Shoulder scaption 100 85 85  Shoulder abduction   80  Shoulder adduction     Shoulder extension     Shoulder internal rotation Reaches small of back Reaches to hip Reaches to hip  Shoulder external rotation Reaches back of head  Reaches up to ear Reaches up to ear  Elbow flexion 150 130   Elbow extension (-10*)  (-35*)   Wrist flexion 35 20   Wrist extension 50 10   (Blank rows = not tested)  UPPER EXTREMITY MMT:    Tested grossly as "push" and "pull": UE push 4-/5, UE pull 4/5  HAND FUNCTION: Grip strength: Right: 30 lbs; Left: 20 lbs  COORDINATION: Finger Nose  Finger test: mildly ataxic but fairly accurate  Box and Blocks:  Right 11blocks, Left 5blocks  MUSCLE TONE:  Rt- normal, Lt- decreased and worsened by inattention/disuse  COGNITION: Overall cognitive status: Impaired and Difficulty to assess due to: severity of deficits; Poor attention, fluency, orientation, and executive function. Recall is fairly good as well as abstraction.  Seems to have personality changes due to vascular dementia- like an OCD type volitional perseveration of tapping with Rt hand that he can somewhat control.  He did become angry and agitated at the end of the session performing car transfer with spouse: he did shout at her.  VISION: Subjective report: no new changes per pt/spouse, uses "cheaters"   VISION ASSESSMENT: Visual fields seem intact though he had latent responses on the left side showing likely inattention/and/neglect, tracking somewhat delayed horizontally and vertically  PERCEPTION: Impaired: Inattention/neglect: does not attend to left visual field and does not attend to left side of body, Body scheme: poor quality of recognition with shaving, etc., and Spatial orientation: decreased in print and in orientation, especially to Lt side  PRAXIS: Not tested specifically but no overt/stated issues using pen, wearing glasses ,etc.   OBSERVATIONS: Pt taps and flicks and does seem to volitionally perseverate on making a rhythm/noise with Rt hand. He states he is aware of it and chooses to do it.  He does also seem to have mild resting and intention tremor.    TODAY'S TREATMENT:     12/15/22 Bimanual task: attempted pipe tree puzzle with focus on incorporating use of LUE as well as L attention into simple pattern replication/building task.  Pt requiring hand over hand (on R hand) to facilitate increased use of LUE.  Pt requiring significantly increased time to complete simple pattern due to decreased sustained attention with both internal and external  distractions. Large amplitude: engaged in scarf tossing with alternating R and L UE to facilitate increased spontaneous, sequential use of alternating UE.  Utilized Ship broker for increased visual feedback and therapist placed behind pt to maintain sequencing of task. Bimanual task: engaged in ball toss with BUE with focus on forced use of LUE.  OT providing demonstration, visual, and verbal cues for L hand positioning to increase incorporating into task. Attention: reviewed recommendations for  decreasing external stimuli to increase success with self-feeding.  Recommending moving pt seat at table, closing blinds, and/or having wife eat with pt to facilitate increased attention to task.   12/08/22 Large amplitude: engaged in reaching in standing while incorporating stepping pattern to poles with colored scarves to challenge cognitive attention with functional reach.  Pt scanning to L to locate scarves on L with min increased time, however frequently reaching with RUE across midline over reaching with LUE.  OT providing cues to reach with LUE with pt only able to carryover to next reach but unable to progress beyond 1x without additional cues.  Encouraged family to complete in standing at counter top for UE support and alternating stepping and reaching to target on cabinet.  OT instructed in forward reach with elbow extension and finger flicks, however pt with decreased amplitude on L side, therefore modified task to complete with BUE in clasped position and then with dowel to facilitate increased shoulder ROM.  Pt responding to use of target with boom whacker for auditory feedback with movements. NMR: Engaged in reaching/kicking with opposite hand and leg ("dead bug" in sitting) with pt demonstrating ability to complete with min difficulty with LLE and RUE, and min-mod difficulty with RLE and LUE.  Pt unable to alternate but able to maintain single side for multiple repetitions and then switch to other R/L  combination.  OT providing intermittent verbal, tactile, and visual cues for quality of movement and sequencing.   12/07/22 Engaged in table top task for attention and bimanual task progressing from simple stacking activity to pattern replication.  Pt initially picking up large blocks with R hand, however with tactile cues to cover R hand pt able to stack and sort by color with L hand.  Pt requiring increased time and cues to visually scan to L to locate targets in L visual field.  Progressed to attempting colored peg pattern replication with peg board placed at midline.  Pt placing pegs only on R side of board with min adherence to pattern with some portions being similar in nature but with no clear replication.  Transitioned to visual attention with newspaper scavenger hunt.  Pt initially flipping through newspaper with inability to locate article on L side of page.  Pt then encouraged to utilize table of contents with pt able to locate correct page and then flip until locating.  Min cues when pt arrived at 35 to locate pg 34 on L.  Pt then able to skim article and identify a few items with article placed at midline.  Pt then scanning newspaper to locate used car section.  Pt then to locate mileage on cars with pt correctly identifying 75% of items.  Pt with intermittent sustained attention, as initially flipping back to used car page despite not being finished with sports article.        PATIENT EDUCATION: Education details: ongoing condition specific education Person educated: Patient and Spouse Education method: Explanation, Demonstration, Tactile cues, and Verbal cues, handout Education comprehension: Explanation, demonstration, v.c  HOME EXERCISE PROGRAM: Access Code: [email or web address] URL: [email or web address] Date: 12/02/2022 Prepared by: [facility] - Outpatient  Rehab - [facility]  Exercises - Seated Shoulder Flexion Towel Slide at Table Top  - 2 x daily - 10-15 reps -  Seated Dowel Chest Press  - 2 x daily - 10-15 reps - Seated Shoulder Flexion AAROM with Dowel  - 2 x daily - 10-15 reps - Dead Bug (IN SITTING)  - 2 x daily -  10 reps     GOALS: Goals reviewed with patient? Yes   SHORT TERM GOALS: (STG required if POC>30 days) Target Date: 12/28/22   Pt will demo/state understanding of HEP with focus on LUE ROM. Goal status: IN PROGRESS  2.  Pt will consistently sustain focus to a simple functional task  in a minimally distracting environment x 6 mins with no more than 1 re-direct(ie: eating, functional task in clinic) Goal status: IN PROGRESS    LONG TERM GOALS: Target Date: 01/25/23  Pt will improve functional ability by decreased impairment per Trinna Balloon assessment from 1/6 to 2/6 or better, for better quality of life. Goal status:  IN PROGRESS  2.  He will show better functional use of left arm by improved box and blocks Test score from 5 blocks to at least 10 blocks. Baseline: 8 blocks on 11/16/22 Goal status: IN PROGRESS  3. Pt will don a flannel button front shirt consistently with min A and min v.c Baseline: mod A with mod vc Goal status:  IN PROGRESS  4.  Pt will use his LUE as an active assist for ADLS/leisure tasks grossly 40% of the time with min v.c Baseline: uses 25% Goal status:  IN PROGRESS  5.  Pt will open the newspaper and locate sports and used car section with no more than min v.c                           Goal status:   IN PROGRESS  6.  Pt will be able to get up/down from toilet with supervision and no cues.  Goal status: IN PROGRESS  7.  Pt will demonstrate improved sustained attention to complete breakfast in <1 hour  Baseline: currently requiring ~2 hours to complete eating breakfast Goal status: IN PROGRESS  ASSESSMENT:  CLINICAL IMPRESSION: Pt continues to demonstrate decreased spontaneous use of LUE during dynamic reaching activity and with seated exercises. OT utilizing mulitmodal cues as pt  responding to visual cues and auditory sounds for increased feedback. Pt with improved visual scanning to L environment during table top tasks this session with pieces placed to L.  Pt benefiting from decreased distractions with door of treatment room closed and removal of extra equipment/items causing distractions.  OT utilizing first, then language to incorporate motivating tasks into treatment tasks.   OT Frequency/DURATION: 2x week x 8 weeks   PLANNED INTERVENTIONS: self care/ADL training, therapeutic exercise, therapeutic activity, neuromuscular re-education, manual therapy, balance training, functional mobility training, moist heat, cryotherapy, patient/family education, cognitive remediation/compensation, visual/perceptual remediation/compensation, psychosocial skills training, energy conservation, coping strategies training, DME and/or AE instructions, and Re-evaluation  RECOMMENDED OTHER SERVICES: He has already seen PT for balance and functional mobility, he would probably benefit from speech for cognitive issues as well, though OT will be working with cognition somewhat functionally now.  CONSULTED AND AGREED WITH PLAN OF CARE: Patient and family member/caregiver  PLAN FOR NEXT SESSION: Review and add to HEP for L shoulder ROM, table top task for attention, initiate large amplitude HEP   Nguyen Butler, OTR/L 2:53 PM 12/15/22

## 2022-12-20 ENCOUNTER — Ambulatory Visit: Payer: PPO | Admitting: Physical Therapy

## 2022-12-20 DIAGNOSIS — M6281 Muscle weakness (generalized): Secondary | ICD-10-CM

## 2022-12-20 DIAGNOSIS — I69318 Other symptoms and signs involving cognitive functions following cerebral infarction: Secondary | ICD-10-CM

## 2022-12-20 DIAGNOSIS — R2689 Other abnormalities of gait and mobility: Secondary | ICD-10-CM | POA: Diagnosis not present

## 2022-12-20 NOTE — Therapy (Signed)
OUTPATIENT PHYSICAL THERAPY NEURO TREATMENT NOTE  Patient Name: William [name] MRN: [id number] DOB:Mar 01, 1941, 82 y.o., male Today's Date: 12/20/2022  PCP: Donato Schultz  DO REFERRING PROVIDER: same   END OF SESSION:  PT End of Session - 12/20/22 1723     Visit Number 44    Number of Visits 54    Date for PT Re-Evaluation 01/22/23    Authorization Type HTA    Progress Note Due on Visit 50    PT Start Time 1532    PT Stop Time 1614    PT Time Calculation (min) 42 min    Equipment Utilized During Treatment Gait belt    Activity Tolerance Patient tolerated treatment well    Behavior During Therapy WFL for tasks assessed/performed                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   HTN (hypertension) 07/19/2015   Hyperlipidemia    Major neurocognitive disorder 01/09/2014   Mild, related to stroke history   Nocturia    Renal insufficiency 06/25/2013   S/P carotid endarterectomy    BILATERAL ICA--  PATENT PER DUPLEX  05-19-2012   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   Right calf   Urinary frequency    Vitamin D deficiency    Past Surgical History:  Procedure Laterality Date   APPENDECTOMY  AS CHILD   CARDIOVASCULAR STRESS TEST  03-27-2012  DR CRENSHAW   LOW RISK LEXISCAN STUDY-- PROBABLE NORMAL PERFUSION AND SOFT TISSUE ATTENUATION/  NO ISCHEMIA/ EF 51%   CAROTID ENDARTERECTOMY Bilateral LEFT  11-12-2008  DR GREG HAYES   RIGHT ICA  2006  ([facility])   CHOLECYSTECTOMY N/A 02/23/2022   Procedure: LAPAROSCOPIC CHOLECYSTECTOMY;   Surgeon: Quentin Ore, MD;  Location: [facility] OR;  Service: General;  Laterality: N/A;   CYSTOSCOPY W/ RETROGRADES Bilateral 06/22/2021   Procedure: CYSTOSCOPY WITH RETROGRADE PYELOGRAM;  Surgeon: Marcine Matar, MD;  Location: [facility] [place];  Service: Urology;  Laterality: Bilateral;   CYSTOSCOPY WITH LITHOLAPAXY N/A 02/26/2013   Procedure: CYSTOSCOPY WITH LITHOLAPAXY;  Surgeon: Marcine Matar, MD;  Location: [facility];  Service: Urology;  Laterality: N/A;   ENDOSCOPIC RETROGRADE CHOLANGIOPANCREATOGRAPHY (ERCP) WITH PROPOFOL N/A 02/22/2022   Procedure: ENDOSCOPIC RETROGRADE CHOLANGIOPANCREATOGRAPHY (ERCP) WITH PROPOFOL;  Surgeon: Jeani Hawking, MD;  Location: [facility] ENDOSCOPY;  Service: Gastroenterology;  Laterality: N/A;   EYE SURGERY  Jan. 2016   cataract surgery both eyes   INGUINAL HERNIA REPAIR Right 11-08-2006   IR KYPHO EA ADDL LEVEL THORACIC OR LUMBAR  02/12/2021   IR RADIOLOGIST EVAL & MGMT  02/18/2021   MASS EXCISION N/A 03/03/2016   Procedure: EXCISION OF BACK  MASS;  Surgeon: Almond Lint, MD;  Location: [facility];  Service: General;  Laterality: N/A;   MOHS SURGERY Left 1/ 2016   Dr Margo Aye-- Basal cell   PROSTATE SURGERY     REMOVAL OF STONES  02/22/2022   Procedure: REMOVAL OF STONES;  Surgeon: Jeani Hawking, MD;  Location: [facility] ENDOSCOPY;  Service: Gastroenterology;;   Dennison Mascot  02/22/2022   Procedure: Dennison Mascot;  Surgeon: Jeani Hawking, MD;  Location: [facility] ENDOSCOPY;  Service: Gastroenterology;;   TRANSURETHRAL RESECTION OF BLADDER TUMOR WITH MITOMYCIN-C N/A 06/22/2021   Procedure: TRANSURETHRAL RESECTION OF BLADDER TUMOR;  Surgeon: Marcine Matar, MD;  Location: [facility];  Service: Urology;  Laterality: N/A;   TRANSURETHRAL RESECTION OF PROSTATE N/A 02/26/2013   Procedure: TRANSURETHRAL RESECTION OF THE PROSTATE WITH GYRUS INSTRUMENTS;  Surgeon: Marcine Matar, MD;  Location: [facility];   Service: Urology;  Laterality: N/A;   TRANSURETHRAL RESECTION OF PROSTATE N/A 06/22/2021   Procedure: TRANSURETHRAL RESECTION OF THE PROSTATE (TURP);  Surgeon: Marcine Matar, MD;  Location: [facility];  Service: Urology;  Laterality: N/A;   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   Insomnia    Prediabetes    Acute 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 (HCC)    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   Transaminitis 07/27/2021   UTI (urinary tract infection) 07/27/2021   CVA (cerebral vascular accident) (HCC) 07/27/2021   Fall 07/27/2021   Hyperglycemia 07/27/2021   Cholelithiasis 07/27/2021   Hypoxia 07/27/2021   Nausea and vomiting 07/27/2021   Acute metabolic encephalopathy 07/27/2021   Normocytic anemia 07/27/2021   Chronic back pain 07/27/2021   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   Closed fracture of first lumbar vertebra with routine healing 02/03/2021   Closed fracture of multiple ribs 11/18/2020   Anxiety 01/29/2020   Leg pain, bilateral 01/29/2020   Ingrown toenail 07/13/2019   Lumbar spondylosis 05/02/2018   Pain in left knee 03/09/2018   Osteoarthritis of left hip 01/16/2018   Trochanteric bursitis of left hip 01/16/2018   Preventative health care 09/26/2017   HTN (hypertension) 07/19/2015   Hyperlipidemia 07/19/2015    Great toe pain 02/11/2014   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   Obesity (BMI 30-39.9) 06/25/2013   Renal insufficiency 06/25/2013   Weakness of left arm 06/25/2013   Sebaceous cyst 03/03/2011   Sprain of lumbar region 07/31/2010   Rib pain, left 08/29/2009   Carotid artery stenosis, asymptomatic, bilateral 05/02/2009   Eczema, atopic 05/31/2008   Vitamin D deficiency 03/01/2008   BPH (benign prostatic hyperplasia) 08/06/2007   Fasting hyperglycemia 12/21/2006   History of right MCA infarct 2006    ONSET DATE: 06/29/22 REFERRING DIAG:  Diagnosis  I63.9 (ICD-10-CM) - Cerebrovascular accident (CVA), unspecified mechanism (HCC)  W19.XXXD (ICD-10-CM) - Fall, subsequent encounter    THERAPY DIAG:  Other abnormalities of gait and mobility  Muscle weakness (generalized)  Other symptoms and signs involving cognitive functions following cerebral infarction  Rationale for Evaluation and Treatment: Rehabilitation  SUBJECTIVE:  SUBJECTIVE STATEMENT:  Wife states ear is still bothering him and she may take him back to Urgent Care.  She states therapy is helping and he is doing better with transfers and with his turns to sit.   PERTINENT HISTORY:  DONNEL JOBIN is a 82 year old man with dementia, CKD, HTN, CAD, HLD and history of CVA  (2006, 2023), Parkinson's  PAIN:  Are you having pain? No  PRECAUTIONS: Fall  WEIGHT BEARING RESTRICTIONS: No  FALLS: Has patient fallen in last 6 months? No  LIVING ENVIRONMENT: Lives with: lives with their family and lives with their spouse Lives in: House/apartment Stairs:  1 brick high step Has following equipment at home: Environmental consultant - 2 wheeled, Wheelchair (manual), Shower bench, bed side commode, Grab bars, and hospital bed, sliding pad for  car  PLOF: Independent with basic ADLs and Independent with household mobility without device  PATIENT GOALS: Walk around the block with his wife. Be able to use the dining room chair rather than have to use the W/C. Increased I with bed mobility and simple tasks at home like make some coffee or brush his teeth, Step over tub to shower, has bench, but he can't really use it. Up and down the one step to get to back deck. 11/23/22: Patient's wife [name] updated his goals today: Walk around store (gets distracted), swing a golf club again (thinking of driving range), get up on his own, walk without leaning forward, more recognition of the left side (from CVA), not need gait belt, OT-- be able to get dressed more on his own, navigate the newspaper.  OBJECTIVE:  (Measures in this section from initial evaluation unless otherwise noted) DIAGNOSTIC FINDINGS:  MRI 2021 with degenerative changes in lumbar spine, L3-4 R subarticular stenosis COGNITION: Overall cognitive status: History of cognitive impairments - at baseline SENSATION: Not tested COORDINATION: Moderately impaired BLE MUSCLE LENGTH: Hamstrings: severely restricted B Thomas test: Severely restricted B. POSTURE: rounded shoulders, forward head, increased thoracic kyphosis, posterior pelvic tilt, and flexed trunk  LOWER EXTREMITY ROM:   BLE extremely stiff throughout, limited hip ROM in all planes, B knees limited in extension. LOWER EXTREMITY MMT:  3+/5 throughout. Unable to determine accuracy of MMT due to cognition. Functionally demosntrates poor coordinated activation and poor muscular endurance. BED MOBILITY: Min A for Supine<> Sit. Patient was using a ladder in his hospital bed and could move MI. TRANSFERS: Min A, mod VC, tends to lean back.  11/02/22 independent with use of hands on arm chair with a couple of tries CURB: Min A-Has one step out to his deck. STAIRS: N/A  GAIT: Gait pattern: step to pattern, decreased arm swing- Right,  decreased arm swing- Left, decreased step length- Right, decreased step length- Left, shuffling, festinating, trunk flexed, narrow BOS, poor foot clearance- Right, and poor foot clearance- Left Distance walked: 79' Assistive device utilized: Environmental consultant - 2 wheeled Level of assistance: Occasional min A Comments: mod VC to stay close to walker, take longer steps. FUNCTIONAL TESTS:  5 times sit to stand: 38.23  09/29/22  could not performs 5XSTS, 11/02/22 = 47 seconds with cues to use arms Timed up and go (TUG): 47.91  with RW, 07/21/22 TUG 29  seconds without device, 09/29/22 25 seconds, 11/02/22 TUG 20 seconds   [facility] Adult PT Treatment:  DATE: 12/20/22 Therapeutic Exercise:  NuStep, Level 2, 4 extremities x 6 min, cues and occasional assist to increase step length for more knee extension 5x sit<>stand 34.5 sec with UE support Additional Sit to stand practice, with BUE support In parallel bars: Squat to stand, 3 x 5 Forward lunge to 6" block 3 x 5 reps each foot leading, cues through hips for increased weightshfit Forward/back walk at parallel bars  Seated hamstring stretch, 3 reps foot propped on 4" block Seated ankle dorsiflexion A/ROM, 5 reps, 3 sec  Gait training Indoor (40 ft x 2)/outdoor (100 ft)gait training without device, RUE support/HHA, with cues to "kick" with LLE for improved foot clearance.  Pt able to achieve better foot clearance with cues.                                                                         PATIENT EDUCATION: Education details: Progress towards goals, POC Person educated: Patient and Spouse Education method: Explanation, Demonstration, and Verbal cues  Education comprehension: verbalized understanding, returned demonstration, verbal cues required, and needs further education  HOME EXERCISE PROGRAM: TBD  GOALS: Goals reviewed with patient? Yes  SHORT TERM GOALS: Target date: 12/22/22  Perform HEP for PWR!  Moves with cues from family. Baseline: Pt's wife, [name], notes walking and hitting beach ball as main HEP. Goal status: GOAL MET, 12/20/2022   2. Decrease 5 x STS to < 35 sec to demonstrate improved functional strength  Baseline: 47 seconds on 5/21 per note; 34.5 sec UE support 12/20/2022  Goal status: GOAL MET, 12/20/2022   3. Perform sit <> stand transfers from average chair height with Supervision.  Baseline: CGA  12/20/2022  Goal status:  IN PROGRESS, 12/20/2022  LONG TERM GOALS: Target date: 01/22/23  Patient will perform HEP progression with caregiver assist. Baseline: has minimal HEP Goal status: REVISED  2.  Decrease 5 x STS to < 25 sec to demonstrate improved functional strength Baseline: 47 seconds on 5/21 per note Goal status: REVISED  3. Improve quality of gait to be able to ambulate x 100 feet with supervision. Baseline: CGA with cues  Goal status: REVISED  4.  Patient will perform his bed mobility with MI including rolling, sit<>supine, and scooting in bed. Baseline: on hard surface mat he is independent but on soft bed at home he struggles, wife reports only needs a hand to get up now 11/02/22 Goal status: REVISED  5.  Patient will demonstrate floor<>stand with CGA and verbal cues for patient's wife to be able to assist. Baseline:  Has to call 9-1-1 Goal status: NEW GOAL  ASSESSMENT:  CLINICAL IMPRESSION:  Pt has met STG 1 and 2.  Wife reports overall improved ease of transfers at home and they are performing exercises at home.  Focused most of today's session on components of leg strengthening and flexibility needed to work towards floor>stand transfers.  He is able to perform squats, forward lunges with UE support and tactile cues.  Worked also on stretching, and pt c/o tightness in hamstrings with seated stretches.  He will continue to benefit from skilled PT towards goals for improved functional mobility.  OBJECTIVE IMPAIRMENTS: Abnormal gait, decreased activity  tolerance, decreased balance, decreased cognition, decreased coordination,  decreased endurance, decreased mobility, difficulty walking, decreased ROM, decreased strength, decreased safety awareness, impaired flexibility, impaired UE functional use, improper body mechanics, and postural dysfunction.   PLAN:  PT FREQUENCY: 2x/week  PT DURATION: 12 weeks  PLANNED INTERVENTIONS: Therapeutic exercises, Therapeutic activity, Neuromuscular re-education, Balance training, Gait training, Patient/Family education, Self Care, Joint mobilization, Stair training, Moist heat, and Manual therapy  PLAN FOR NEXT SESSION: Practice components for floor>stand transfers.  Use NuStep at beginning of session-focus on increased stride length on Nustep.  Strength and flexibility exercises needed for floor>stand transfers.  Gait cues to improve stride and posture. Large amplitude training; wide BOS for stepping, forward/back walking, sidestepping.  Joushua Dugar W., PT 12/20/2022, 5:24 PM  [facility] [address], [address] [place], [place], [address] Phone: [phone]   Fax:  [phone]

## 2022-12-21 ENCOUNTER — Encounter: Payer: Self-pay | Admitting: Family Medicine

## 2022-12-21 ENCOUNTER — Ambulatory Visit: Payer: PPO | Admitting: Occupational Therapy

## 2022-12-21 ENCOUNTER — Ambulatory Visit: Payer: PPO | Admitting: Family Medicine

## 2022-12-21 ENCOUNTER — Other Ambulatory Visit (HOSPITAL_BASED_OUTPATIENT_CLINIC_OR_DEPARTMENT_OTHER): Payer: Self-pay

## 2022-12-21 VITALS — BP 151/82 | HR 60 | Temp 97.7°F | Resp 12 | Ht 69.0 in | Wt 171.6 lb

## 2022-12-21 DIAGNOSIS — I69354 Hemiplegia and hemiparesis following cerebral infarction affecting left non-dominant side: Secondary | ICD-10-CM

## 2022-12-21 DIAGNOSIS — R278 Other lack of coordination: Secondary | ICD-10-CM

## 2022-12-21 DIAGNOSIS — I69318 Other symptoms and signs involving cognitive functions following cerebral infarction: Secondary | ICD-10-CM

## 2022-12-21 DIAGNOSIS — R41840 Attention and concentration deficit: Secondary | ICD-10-CM

## 2022-12-21 DIAGNOSIS — H9202 Otalgia, left ear: Secondary | ICD-10-CM

## 2022-12-21 DIAGNOSIS — M6281 Muscle weakness (generalized): Secondary | ICD-10-CM

## 2022-12-21 DIAGNOSIS — R2689 Other abnormalities of gait and mobility: Secondary | ICD-10-CM | POA: Diagnosis not present

## 2022-12-21 MED ORDER — OFLOXACIN 0.3 % OT SOLN
10.0000 [drp] | Freq: Every day | OTIC | 0 refills | Status: DC
Start: 2022-12-21 — End: 2023-04-18
  Filled 2022-12-21: qty 5, 10d supply, fill #0

## 2022-12-21 NOTE — Therapy (Unsigned)
OUTPATIENT OCCUPATIONAL THERAPY TREATMENT NOTE  Patient Name: William [name] MRN: [id number] DOB:[birth year], 82 y.o., male Today's Date: 12/21/2022  PCP & REFERRING PROVIDER:  [name], [name], DO    END OF SESSION:  OT End of Session - 12/21/22 1452     Visit Number 36    Number of Visits 47    Date for OT Re-Evaluation 01/25/23    Authorization Type [employer]    Progress Note Due on Visit 40    OT Start Time 1448    OT Stop Time 1530    OT Time Calculation (min) 42 min    Activity Tolerance Patient tolerated treatment well    Behavior During Therapy WFL for tasks assessed/performed                                  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   HTN (hypertension) 07/19/2015   Hyperlipidemia    Major neurocognitive disorder 01/09/2014   Mild, related to stroke history   Nocturia    Renal insufficiency 06/25/2013   S/P carotid endarterectomy    BILATERAL ICA--  PATENT PER DUPLEX  05-19-2012   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   Right calf   Urinary frequency    Vitamin D deficiency    Past Surgical History:  Procedure Laterality Date   APPENDECTOMY  AS CHILD   CARDIOVASCULAR STRESS TEST  03-27-2012  DR CRENSHAW   LOW RISK LEXISCAN STUDY-- PROBABLE NORMAL PERFUSION AND SOFT TISSUE ATTENUATION/  NO ISCHEMIA/ EF 51%   CAROTID ENDARTERECTOMY Bilateral LEFT  11-12-2008  DR GREG HAYES   RIGHT ICA  2006  ([facility])   CHOLECYSTECTOMY N/A 02/23/2022   Procedure: LAPAROSCOPIC CHOLECYSTECTOMY;   Surgeon: Quentin Ore, MD;  Location: [facility] OR;  Service: General;  Laterality: N/A;   CYSTOSCOPY W/ RETROGRADES Bilateral 06/22/2021   Procedure: CYSTOSCOPY WITH RETROGRADE PYELOGRAM;  Surgeon: Marcine Matar, MD;  Location: [facility] ;  Service: Urology;  Laterality: Bilateral;   CYSTOSCOPY WITH LITHOLAPAXY N/A 02/26/2013   Procedure: CYSTOSCOPY WITH LITHOLAPAXY;  Surgeon: Marcine Matar, MD;  Location: [facility];  Service: Urology;  Laterality: N/A;   ENDOSCOPIC RETROGRADE CHOLANGIOPANCREATOGRAPHY (ERCP) WITH PROPOFOL N/A 02/22/2022   Procedure: ENDOSCOPIC RETROGRADE CHOLANGIOPANCREATOGRAPHY (ERCP) WITH PROPOFOL;  Surgeon: Jeani Hawking, MD;  Location: [facility] ENDOSCOPY;  Service: Gastroenterology;  Laterality: N/A;   EYE SURGERY  Jan. 2016   cataract surgery both eyes   INGUINAL HERNIA REPAIR Right 11-08-2006   IR KYPHO EA ADDL LEVEL THORACIC OR LUMBAR  02/12/2021   IR RADIOLOGIST EVAL & MGMT  02/18/2021   MASS EXCISION N/A 03/03/2016   Procedure: EXCISION OF BACK  MASS;  Surgeon: Almond Lint, MD;  Location: [facility];  Service: General;  Laterality: N/A;   MOHS SURGERY Left 1/ 2016   Dr Margo Aye-- Basal cell   PROSTATE SURGERY  REMOVAL OF STONES  02/22/2022   Procedure: REMOVAL OF STONES;  Surgeon: Jeani Hawking, MD;  Location: [facility] ENDOSCOPY;  Service: Gastroenterology;;   Dennison Mascot  02/22/2022   Procedure: Dennison Mascot;  Surgeon: Jeani Hawking, MD;  Location: [facility] ENDOSCOPY;  Service: Gastroenterology;;   TRANSURETHRAL RESECTION OF BLADDER TUMOR WITH MITOMYCIN-C N/A 06/22/2021   Procedure: TRANSURETHRAL RESECTION OF BLADDER TUMOR;  Surgeon: Marcine Matar, MD;  Location: [facility];  Service: Urology;  Laterality: N/A;   TRANSURETHRAL RESECTION OF PROSTATE N/A 02/26/2013   Procedure: TRANSURETHRAL RESECTION OF THE PROSTATE WITH GYRUS INSTRUMENTS;  Surgeon: Marcine Matar, MD;  Location: [facility];   Service: Urology;  Laterality: N/A;   TRANSURETHRAL RESECTION OF PROSTATE N/A 06/22/2021   Procedure: TRANSURETHRAL RESECTION OF THE PROSTATE (TURP);  Surgeon: Marcine Matar, MD;  Location: [facility];  Service: Urology;  Laterality: N/A;   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   Insomnia    Prediabetes    Acute 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 (HCC)    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   Transaminitis 07/27/2021   UTI (urinary tract infection) 07/27/2021   CVA (cerebral vascular accident) (HCC) 07/27/2021   Fall 07/27/2021   Hyperglycemia 07/27/2021   Cholelithiasis 07/27/2021   Hypoxia 07/27/2021   Nausea and vomiting 07/27/2021   Acute metabolic encephalopathy 07/27/2021   Normocytic anemia 07/27/2021   Chronic back pain 07/27/2021   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   Closed fracture of first lumbar vertebra with routine healing 02/03/2021   Closed fracture of multiple ribs 11/18/2020   Anxiety 01/29/2020   Leg pain, bilateral 01/29/2020   Ingrown toenail 07/13/2019   Lumbar spondylosis 05/02/2018   Pain in left knee 03/09/2018   Osteoarthritis of left hip 01/16/2018   Trochanteric bursitis of left hip 01/16/2018   Preventative health care 09/26/2017   HTN (hypertension) 07/19/2015   Hyperlipidemia 07/19/2015    Great toe pain 02/11/2014   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   Obesity (BMI 30-39.9) 06/25/2013   Renal insufficiency 06/25/2013   Weakness of left arm 06/25/2013   Sebaceous cyst 03/03/2011   Sprain of lumbar region 07/31/2010   Rib pain, left 08/29/2009   Carotid artery stenosis, asymptomatic, bilateral 05/02/2009   Eczema, atopic 05/31/2008   Vitamin D deficiency 03/01/2008   BPH (benign prostatic hyperplasia) 08/06/2007   Fasting hyperglycemia 12/21/2006   History of right MCA infarct 2006    ONSET DATE: Acute exacerbation of chronic symptoms (most recent hospitalization 02/26/22 - 03/12/22, followed by SNF and home health)   REFERRING DIAG:  I63.9 (ICD-10-CM) - Cerebrovascular accident (CVA), unspecified mechanism (HCC)  W19.XXXD (ICD-10-CM) - Fall, subsequent encounter    THERAPY DIAG:  Muscle weakness (generalized)  Other symptoms and signs involving cognitive functions following cerebral infarction  Hemiplegia and hemiparesis following cerebral infarction affecting left non-dominant side (HCC)  Other lack  of coordination  Attention and concentration deficit  Rationale for Evaluation and Treatment: Rehabilitation  PERTINENT HISTORY: PMHx includes L basal ganglia and corona radiata infarct 07/27/21, L lower homonymous quadrantanopia; R MCA infarct in 2006 w/ L-sided hemiparesis, NCD, carotid artery disease (bilateral), HTN, HLD,CKD3, vascular dementia, and hx of bladder cancer. He has had falls in 2023, fracturing his clavicle in August, then he had stomach surgery in Sept 2023.  He needs assist at baseline for ADLs. When last documented in acute rehab after having his gall-bladder removed, (03/09/22) he required moderate to maximum assist x2 for most ADLs (more assist for ADLs the require standing), and they recommended rehab in skilled nursing setting. He apparently got Covid at some point and also attended a SNF. Once he D/C home, he had some episodes of  combativeness at night  with "sun-downing."  He is communicative and appropriate, though appears easily confused and distracted, not fully oriented to time and place (Ax2, states date is 39 and cannot say what city he lives in). His wife provides most details, and he is participative, though a poor historian at times. In a nut shell- he has been suffering from dementia and post-stroke effects for years, and his issues were recently exacerbated in Sept of last year after abdominal surgery. He has less use of Lt "stroke side" and new, ritualistic "ticks" in mouth and Rt hand/arm.  Wife states that they do have a caregiver come M, W, F for 4 hours in the middle of the day to mainly help him bathe and dress, which usually tires him out. He has been more combative towards the end of the day as well.   PRECAUTIONS: Fall and Other: Lt hemi, poor cognition, LATEX and adhesive allergies, etc.  ;WEIGHT BEARING RESTRICTIONS: No   SUBJECTIVE:   SUBJECTIVE STATEMENT: Spouse reports pt completing breakfast in 45-60 mins with blinds closed vs nearly 2 hours with normal setup/routine.  Pt accompanied by:  his wife   PAIN: no   FALLS: Has patient fallen in last 6 months? Yes. Number of falls 1 in last couple weeks and 2-3 near falls (backwards) when getting up from various height chairs   LIVING ENVIRONMENT: Lives with: lives with their spouse Lives in: House/apartment Has following equipment at home: manual w/c, 2 w/w, weighted utensils, shower bench (for tub), bed cane, hospital bed, raised toilet, and possibly more  PLOF: Needs assistance with ADLs, Needs assistance with homemaking, Needs assistance with gait, and Needs assistance with transfers  PATIENT GOALS: 11/30/22: to return to playing golf, increased attention (less distraction) to task to engage in ADLs and leisure pursuits, increase Lt arm usage   OBJECTIVE: (All objective assessments below are from initial evaluation on: 07/07/22 unless  otherwise specified.)   HAND DOMINANCE: Right  ADLs: Overall ADLs: Wife states decreased ability since hospitalization in Sept 23.  Transfers/ambulation related to ADLs: Min A transfer, CGA for mobility Eating: set up Grooming: Mod A- needs assist with oral care, shaving, but can comb his hair UB Dressing: Min A  LB Dressing: Dep  Toileting:  Mod  Bathing: Mod  Tub Shower transfers: Min A  Equipment: Emergency planning/management officer, Grab bars, and Feeding equipment  IADLs: completely dependent for years now, except some ability to use microwave which is now not possible after hospitalization   MOBILITY STATUS: Needs Assist: CGA - Min A, Hx of falls, Festination, difficulty with turns, and neglect on Lt side  POSTURE COMMENTS:  rounded shoulders, forward head, and weight  shift left  ACTIVITY TOLERANCE: Activity tolerance: he became tired and somewhat agitated by the end of the OT evaluation today (after PT and OT about 2.5hours with mostly sedate activities in seated positions)  FUNCTIONAL OUTCOME MEASURES: [facility] 17/30  showing problems with visuospatial/executive function, attention, fluency, and orientation. (at least 26/30 is needed to be considered "normal")  Trinna Balloon in ADLs: 1/6  (very dependent) 11/30/22: PPT#2: 21.79 sec  UPPER EXTREMITY ROM:  from initial eval  Active ROM Right eval Left eval Left 11/30/22  Shoulder scaption 100 85 85  Shoulder abduction   80  Shoulder adduction     Shoulder extension     Shoulder internal rotation Reaches small of back Reaches to hip Reaches to hip  Shoulder external rotation Reaches back of head  Reaches up to ear Reaches up to ear  Elbow flexion 150 130   Elbow extension (-10*)  (-35*)   Wrist flexion 35 20   Wrist extension 50 10   (Blank rows = not tested)  UPPER EXTREMITY MMT:    Tested grossly as "push" and "pull": UE push 4-/5, UE pull 4/5  HAND FUNCTION: Grip strength: Right: 30 lbs; Left: 20 lbs  COORDINATION: Finger  Nose Finger test: mildly ataxic but fairly accurate  Box and Blocks:  Right 11blocks, Left 5blocks  MUSCLE TONE:  Rt- normal, Lt- decreased and worsened by inattention/disuse  COGNITION: Overall cognitive status: Impaired and Difficulty to assess due to: severity of deficits; Poor attention, fluency, orientation, and executive function. Recall is fairly good as well as abstraction.  Seems to have personality changes due to vascular dementia- like an OCD type volitional perseveration of tapping with Rt hand that he can somewhat control.  He did become angry and agitated at the end of the session performing car transfer with spouse: he did shout at her.  VISION: Subjective report: no new changes per pt/spouse, uses "cheaters"   VISION ASSESSMENT: Visual fields seem intact though he had latent responses on the left side showing likely inattention/and/neglect, tracking somewhat delayed horizontally and vertically  PERCEPTION: Impaired: Inattention/neglect: does not attend to left visual field and does not attend to left side of body, Body scheme: poor quality of recognition with shaving, etc., and Spatial orientation: decreased in print and in orientation, especially to Lt side  PRAXIS: Not tested specifically but no overt/stated issues using pen, wearing glasses ,etc.   OBSERVATIONS: Pt taps and flicks and does seem to volitionally perseverate on making a rhythm/noise with Rt hand. He states he is aware of it and chooses to do it.  He does also seem to have mild resting and intention tremor.    TODAY'S TREATMENT:     12/21/22 Engaged in structured activity with focus on sustained attention to task, with goal of attending for 6 mins.  Pt engaged in Connect 4 task, attending for 1.5 mins however demonstrating impulsivity during task requiring frequent cues and redirection to alternate turns during activity.  Pt demonstrating decreased attention to L side, therefore OT modified task with placing  Connect 4 grid further to R side of table and encouraging use of L hand with increased pacing but still with decreased motor control and again decreased attention to task. Large amplitude: engaged in reaching to targets at mirror, incorporating weight shifting and changing directions, to facilitate large amplitude reaching and attention to L body. Modifying target on L due to decreased ROM and shoulder tightness.   Shoulder ROM: engaged in shoulder shrugs, scapular retraction with  manual facilitation, followed by use of dowels in BUE to facilitate large amplitude opposition.  OT facilitating with hand over hand on LUE to facilitate increased ROM and providing verbal and auditory cues to facilitate increased sequence of forward/backward arm movements.    12/15/22 Bimanual task: attempted pipe tree puzzle with focus on incorporating use of LUE as well as L attention into simple pattern replication/building task.  Pt requiring hand over hand (on R hand) to facilitate increased use of LUE.  Pt requiring significantly increased time to complete simple pattern due to decreased sustained attention with both internal and external distractions. Large amplitude: engaged in scarf tossing with alternating R and L UE to facilitate increased spontaneous, sequential use of alternating UE.  Utilized Ship broker for increased visual feedback and therapist placed behind pt to maintain sequencing of task. Bimanual task: engaged in ball toss with BUE with focus on forced use of LUE.  OT providing demonstration, visual, and verbal cues for L hand positioning to increase incorporating into task. Attention: reviewed recommendations for decreasing external stimuli to increase success with self-feeding.  Recommending moving pt seat at table, closing blinds, and/or having wife eat with pt to facilitate increased attention to task.   12/08/22 Large amplitude: engaged in reaching in standing while incorporating stepping pattern to poles  with colored scarves to challenge cognitive attention with functional reach.  Pt scanning to L to locate scarves on L with min increased time, however frequently reaching with RUE across midline over reaching with LUE.  OT providing cues to reach with LUE with pt only able to carryover to next reach but unable to progress beyond 1x without additional cues.  Encouraged family to complete in standing at counter top for UE support and alternating stepping and reaching to target on cabinet.  OT instructed in forward reach with elbow extension and finger flicks, however pt with decreased amplitude on L side, therefore modified task to complete with BUE in clasped position and then with dowel to facilitate increased shoulder ROM.  Pt responding to use of target with boom whacker for auditory feedback with movements. NMR: Engaged in reaching/kicking with opposite hand and leg ("dead bug" in sitting) with pt demonstrating ability to complete with min difficulty with LLE and RUE, and min-mod difficulty with RLE and LUE.  Pt unable to alternate but able to maintain single side for multiple repetitions and then switch to other R/L combination.  OT providing intermittent verbal, tactile, and visual cues for quality of movement and sequencing.     PATIENT EDUCATION: Education details: ongoing condition specific education Person educated: Patient and Spouse Education method: Explanation, Demonstration, Tactile cues, and Verbal cues, handout Education comprehension: Explanation, demonstration, v.c  HOME EXERCISE PROGRAM: Access Code: [email or web address] URL: [email or web address] Date: 12/02/2022 Prepared by: [facility] - Outpatient  Rehab - [facility]  Exercises - Seated Shoulder Flexion Towel Slide at Table Top  - 2 x daily - 10-15 reps - Seated Dowel Chest Press  - 2 x daily - 10-15 reps - Seated Shoulder Flexion AAROM with Dowel  - 2 x daily - 10-15 reps - Dead Bug (IN SITTING)  - 2 x daily - 10  reps     GOALS: Goals reviewed with patient? Yes   SHORT TERM GOALS: (STG required if POC>30 days) Target Date: 12/28/22   Pt will demo/state understanding of HEP with focus on LUE ROM. Goal status: IN PROGRESS  2.  Pt will consistently sustain focus to a simple functional task  in  a minimally distracting environment x 6 mins with no more than 1 re-direct(ie: eating, functional task in clinic) Goal status: IN PROGRESS    LONG TERM GOALS: Target Date: 01/25/23  Pt will improve functional ability by decreased impairment per Trinna Balloon assessment from 1/6 to 2/6 or better, for better quality of life. Goal status:  IN PROGRESS  2.  He will show better functional use of left arm by improved box and blocks Test score from 5 blocks to at least 10 blocks. Baseline: 8 blocks on 11/16/22 Goal status: IN PROGRESS  3. Pt will don a flannel button front shirt consistently with min A and min v.c Baseline: mod A with mod vc Goal status:  IN PROGRESS  4.  Pt will use his LUE as an active assist for ADLS/leisure tasks grossly 40% of the time with min v.c Baseline: uses 25% Goal status:  IN PROGRESS  5.  Pt will open the newspaper and locate sports and used car section with no more than min v.c                           Goal status:   IN PROGRESS  6.  Pt will be able to get up/down from toilet with supervision and no cues.  Goal status: IN PROGRESS  7.  Pt will demonstrate improved sustained attention to complete breakfast in <1 hour  Baseline: currently requiring ~2 hours to complete eating breakfast Goal status: IN PROGRESS  ASSESSMENT:  CLINICAL IMPRESSION: Pt continues to demonstrate decreased spontaneous use of LUE during dynamic reaching activity and with seated exercises, during forced use pt able to demonstrate decent grasp requiring increased time but with good effort.  Pt demonstrating increased distractions during session, as treating in large gym space with multiple  sessions ongoing as well as distractions outside.  Pt attending to task for 1.5 mins, however requiring cues for sequencing of task with alternating turns with therapist.  Pt demonstrating difficulty with sequencing of BUE task challenging bimanual sequencing and pacing, demonstrating impulsivity and quick movements with RUE.  OT Frequency/DURATION: 2x week x 8 weeks   PLANNED INTERVENTIONS: self care/ADL training, therapeutic exercise, therapeutic activity, neuromuscular re-education, manual therapy, balance training, functional mobility training, moist heat, cryotherapy, patient/family education, cognitive remediation/compensation, visual/perceptual remediation/compensation, psychosocial skills training, energy conservation, coping strategies training, DME and/or AE instructions, and Re-evaluation  RECOMMENDED OTHER SERVICES: He has already seen PT for balance and functional mobility, he would probably benefit from speech for cognitive issues as well, though OT will be working with cognition somewhat functionally now.  CONSULTED AND AGREED WITH PLAN OF CARE: Patient and family member/caregiver  PLAN FOR NEXT SESSION: Review and add to HEP for L shoulder ROM, table top task for attention - newspaper, initiate large amplitude HEP   Majesty Oehlert, OTR/L 2:53 PM 12/21/22

## 2022-12-21 NOTE — Patient Instructions (Signed)
Poison Ivy Dermatitis Poison ivy dermatitis is irritation and swelling (inflammation) of the skin caused by chemicals in the leaves of the poison ivy plant. The skin reaction often involves redness, blisters, and extreme itching. What are the causes? This condition is caused by a chemical (urushiol) found in the sap of the poison ivy plant. This chemical is sticky and can easily spread to people, animals, and objects. You can get poison ivy dermatitis by: Having direct contact with a poison ivy plant. Touching animals, other people, or objects that have come in contact with poison ivy and have the chemical on them. What increases the risk? This condition is more likely to develop in people who: Are outdoors often in wooded or marshy areas. Go outdoors without wearing protective clothing, such as closed shoes, long pants, and a long-sleeved shirt. What are the signs or symptoms? Symptoms of this condition include: Redness of the skin. Extreme itching. A rash that often includes bumps and blisters. The rash usually appears 48 hours after exposure, if you have been exposed before. If this is the first time you have been exposed, the rash may not appear until a week after exposure. Swelling. This may occur if the reaction is more severe. Symptoms usually last for 1-2 weeks. However, the first time you develop this condition, symptoms may last 3-4 weeks. How is this diagnosed? This condition may be diagnosed based on your symptoms and a physical exam. Your health care provider may also ask you about any recent outdoor activity. How is this treated? Treatment for this condition will vary depending on how severe it is. Treatment may include: Hydrocortisone cream or calamine lotion to relieve itching. Oatmeal baths to soothe the skin. Medicines, such as over-the-counter antihistamine tablets. Oral or injected steroid medicine, for more severe reactions. Follow these instructions at  home: Medicines Take or apply over-the-counter and prescription medicines only as told by your health care provider. Use hydrocortisone cream or calamine lotion as needed to soothe the skin and relieve itching. General instructions Do not scratch or rub your skin. Apply a cold, wet cloth (cold compress) to the affected areas or take baths in cool water. This will help with itching. Avoid hot baths and showers. Take oatmeal baths as needed. Use colloidal oatmeal. You can get this at your local pharmacy or grocery store. Follow the instructions on the packaging. Wash all clothes, bedsheets, towels, and blankets you were in contact with between your exposure and appearance of the rash. Check the affected area every day for signs of infection. Check for: More redness, swelling, or pain. Fluid or blood. Warmth. Pus or a bad smell. Keep all follow-up visits. Your health care provider may want to see how your skin is progressing with treatment. How is this prevented?  Learn to identify the poison ivy plant and avoid contact with the plant. This plant can be recognized by the number of leaves. Generally, poison ivy has three leaves with flowering branches on a single stem. The leaves are typically glossy, and they have jagged edges that come to a point. If you have been exposed to poison ivy, thoroughly wash with soap and water right away. You have about 30 minutes to remove the plant resin before it will cause the rash. Be sure to wash under your fingernails, because any plant resin there will continue to spread the rash. When hiking or camping, wear clothes that will help you to avoid skin exposure. This includes long pants, a long-sleeved shirt, long socks,   and hiking boots. You can also apply preventive lotion to your skin to help limit exposure. If you suspect that your clothes or outdoor gear came in contact with poison ivy, rinse them off outside with a garden hose before you bring them inside  your house. When doing yard work or gardening, wear gloves, long sleeves, long pants, and boots. Wash your garden tools and gloves if they come in contact with poison ivy. If you suspect that your pet has come into contact with poison ivy, wash them with pet shampoo and water. Make sure to wear gloves while washing your pet. Contact a health care provider if: You have open sores in the rash area. You have any signs of infection. You have redness that spreads beyond the rash area. You have a fever. You have a rash over a large area of your body. You have a rash on your eyes, mouth, or genitals. You have a rash that does not improve after a few weeks. Get help right away if: Your face swells or your eyes swell shut. You have trouble breathing. You have trouble swallowing. These symptoms may be an emergency. Get help right away. Call 911. Do not wait to see if the symptoms will go away. Do not drive yourself to the hospital. This information is not intended to replace advice given to you by your health care provider. Make sure you discuss any questions you have with your health care provider. Document Revised: 10/29/2021 Document Reviewed: 10/29/2021 Elsevier Patient Education  2024 [employer]vier Inc.  

## 2022-12-21 NOTE — Progress Notes (Signed)
Established Patient Office Visit  Subjective   Patient ID: William [name], male    DOB: [birth year]  Age: 82 y.o. MRN: [id number]  Chief Complaint  Patient presents with   left side ear pain    Went to urgent care- had fluid and was given fluticasone nasal spray.    HPI Discussed the use of AI scribe software for clinical note transcription with the patient, who gave verbal consent to proceed.  History of Present Illness   The patient presents with left ear discomfort and hearing loss. He recently visited urgent care due to ear issues. The left ear was flushed out and a significant amount of wax was removed, but the patient reports that the process was not completed. The right ear was noted to have fluid and was red. The patient was given a nasal spray for treatment. Despite using the nasal spray for over a week, he now reports inability to hear from the left ear. The patient also reports soreness in the left ear. He has a history of kidney disease which limits his medication options.      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   Insomnia    Prediabetes    Acute 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 (HCC)    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   Transaminitis 07/27/2021   UTI (urinary tract infection) 07/27/2021   CVA (cerebral vascular accident) (HCC) 07/27/2021   Fall 07/27/2021    Hyperglycemia 07/27/2021   Cholelithiasis 07/27/2021   Hypoxia 07/27/2021   Nausea and vomiting 07/27/2021   Acute metabolic encephalopathy 07/27/2021   Normocytic anemia 07/27/2021   Chronic back pain 07/27/2021   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   Closed fracture of first lumbar vertebra with routine healing 02/03/2021   Closed fracture of multiple ribs 11/18/2020   Anxiety 01/29/2020   Leg pain, bilateral 01/29/2020   Ingrown toenail 07/13/2019   Lumbar spondylosis 05/02/2018   Pain in left knee 03/09/2018   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   Trochanteric bursitis of left hip 01/16/2018   Preventative health care 09/26/2017   HTN (hypertension) 07/19/2015   Hyperlipidemia 07/19/2015   Great toe pain 02/11/2014   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   Obesity (BMI 30-39.9) 06/25/2013   Renal insufficiency 06/25/2013   Weakness of left arm 06/25/2013   Sebaceous cyst 03/03/2011   Sprain of lumbar region 07/31/2010   Rib pain, left 08/29/2009   Carotid artery stenosis, asymptomatic, bilateral 05/02/2009   Eczema, atopic 05/31/2008   Vitamin D deficiency 03/01/2008   BPH (benign prostatic hyperplasia) 08/06/2007   Fasting hyperglycemia 12/21/2006   History of right MCA infarct 2006   Past Medical History:  Diagnosis Date   Arthritis    low back   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   Mohs surgery jan 2016    Bladder stone  BPH (benign prostatic hyperplasia) 08/06/2007   Chronic kidney disease 2014   Stage III   Closed fracture of fifth metacarpal bone 05/15/2015   Eczema    Fasting hyperglycemia 12/21/2006   GERD (gastroesophageal reflux disease)    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/14/2004   HTN (hypertension) 07/19/2015   Hyperlipidemia    Major neurocognitive disorder 01/09/2014   Mild, related to stroke history   Nocturia    Renal insufficiency 06/25/2013   S/P carotid endarterectomy    BILATERAL  ICA--  PATENT PER DUPLEX  05-19-2012   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   Right calf   Urinary frequency    Vitamin D deficiency    Past Surgical History:  Procedure Laterality Date   APPENDECTOMY  AS CHILD   CARDIOVASCULAR STRESS TEST  03-27-2012  DR CRENSHAW   LOW RISK LEXISCAN STUDY-- PROBABLE NORMAL PERFUSION AND SOFT TISSUE ATTENUATION/  NO ISCHEMIA/ EF 51%   CAROTID ENDARTERECTOMY Bilateral LEFT  11-12-2008  DR GREG HAYES   RIGHT ICA  2006  ([facility])   CHOLECYSTECTOMY N/A 02/23/2022   Procedure: LAPAROSCOPIC CHOLECYSTECTOMY;  Surgeon: Quentin Ore, MD;  Location: [facility] OR;  Service: General;  Laterality: N/A;   CYSTOSCOPY W/ RETROGRADES Bilateral 06/22/2021   Procedure: CYSTOSCOPY WITH RETROGRADE PYELOGRAM;  Surgeon: Marcine Matar, MD;  Location[facility];  Service: Urology;  Laterality: Bilateral;   CYSTOSCOPY WITH LITHOLAPAXY N/A 02/26/2013   Procedure: CYSTOSCOPY WITH LITHOLAPAXY;  Surgeon: Marcine Matar, MD;  Location: [facility];  Service: Urology;  Laterality: N/A;   ENDOSCOPIC RETROGRADE CHOLANGIOPANCREATOGRAPHY (ERCP) WITH PROPOFOL N/A 02/22/2022   Procedure: ENDOSCOPIC RETROGRADE CHOLANGIOPANCREATOGRAPHY (ERCP) WITH PROPOFOL;  Surgeon: Jeani Hawking, MD;  Location: [facility] END[facility]COPY;  Service: Gastroenterology;  Laterality: N/A;   EYE SURGERY  Jan. 2016   cataract surgery both eyes   INGUINAL HERNIA REPAIR Right 11-08-2006   IR KYPHO EA ADDL LEVEL THORACIC OR LUMBAR  02/12/2021   IR RADIOLOGIST EVAL & MGMT  02/18/2021   MASS EXCISION N/A 03/03/2016   Procedure: EXCISION OF BACK  MASS;  Surgeon: Almond Lint, MD;  Location: [facility];  Service: General;  Laterality: N/A;   MOHS SURGERY Left 1/ 2016   Dr Margo Aye-- Basal cell   PROSTATE SURGERY     REMOVAL OF STONES  02/22/2022   Procedure: REMOVAL OF STONES;  Surgeon: Jeani Hawking, MD;  Location: [facility] END[facility];  Service:  Gastroenterology;;   Dennison Mascot  02/22/2022   Procedure: Dennison Mascot;  Surgeon: Jeani Hawking, MD;  Location: [facility] ENDOSCOPY;  Service: Gastroenterology;;   TRANSURETHRAL RESECTION OF BLADDER TUMOR WITH MITOMYCIN-C N/A 06/22/2021   Procedure: TRANSURETHRAL RESECTION OF BLADDER TUMOR;  Surgeon: Marcine Matar, MD;  Location: [facility];  Service: Urology;  Laterality: N/A;   TRANSURETHRAL RESECTION OF PROSTATE N/A 02/26/2013   Procedure: TRANSURETHRAL RESECTION OF THE PROSTATE WITH GYRUS INSTRUMENTS;  Surgeon: Marcine Matar, MD;  Location: [facility];  Service: Urology;  Laterality: N/A;   TRANSURETHRAL RESECTION OF PROSTATE N/A 06/22/2021   Procedure: TRANSURETHRAL RESECTION OF THE PROSTATE (TURP);  Surgeon: Marcine Matar, MD;  Location: [facility];  Service: Urology;  Laterality: N/A;   Social History   Tobacco Use   Smoking status: Former    Packs/day: 2.00    Years: 40.00    Additional pack years: 0.00    Total pack years: 80.00    Types: Cigarettes    Quit  date: 02/15/2005    Years since quitting: 17.8   Smokeless tobacco: Never  Vaping Use   Vaping Use: Never used  Substance Use Topics   Alcohol use: Not Currently    Comment: Occasional   Drug use: No   Social History   Socioeconomic History   Marital status: Married    Spouse name: Not on file   Number of children: 2   Years of education: 12   Highest education level: 12th grade  Occupational History    Employer: Retired  Tobacco Use   Smoking status: Former    Packs/day: 2.00    Years: 40.00    Additional pack years: 0.00    Total pack years: 80.00    Types: Cigarettes    Quit date: 02/15/2005    Years since quitting: 17.8   Smokeless tobacco: Never  Vaping Use   Vaping Use: Never used  Substance and Sexual Activity   Alcohol use: Not Currently    Comment: Occasional   Drug use: No   Sexual activity: Not Currently    Partners: Female  Other Topics  Concern   Not on file  Social History Narrative   Exercise--  Walks with assistance -- using wheelchair more than the walker       LIves with wife , no stairs in home, caffeine - one cup coffee day, exercise - not much, Right handed, 12th grade, retired      One story home   Social Determinants of Health   Financial Resource Strain: Low Risk  (12/20/2022)   Overall Financial Resource Strain (CARDIA)    Difficulty of Paying Living Expenses: Not very hard  Food Insecurity: No Food Insecurity (12/20/2022)   Hunger Vital Sign    Worried About Running Out of Food in the Last Year: Never true    Ran Out of Food in the Last Year: Never true  Transportation Needs: No Transportation Needs (12/20/2022)   PRAPARE - Administrator, Civil Service (Medical): No    Lack of Transportation (Non-Medical): No  Physical Activity: Unknown (12/20/2022)   Exercise Vital Sign    Days of Exercise per Week: 0 days    Minutes of Exercise per Session: Not on file  Stress: Stress Concern Present (12/20/2022)   [employer] of Occupational Health - Occupational Stress Questionnaire    Feeling of Stress : To some extent  Social Connections: Moderately Isolated (12/20/2022)   Social Connection and Isolation Panel [NHANES]    Frequency of Communication with Friends and Family: More than three times a week    Frequency of Social Gatherings with Friends and Family: Never    Attends Religious Services: Never    Database administrator or Organizations: No    Attends Engineer, structural: Not on file    Marital Status: Married  Catering manager Violence: Not At Risk (03/02/2022)   Humiliation, Afraid, Rape, and Kick questionnaire    Fear of Current or Ex-Partner: No    Emotionally Abused: No    Physically Abused: No    Sexually Abused: No   Family Status  Relation Name Status   Mother  Deceased at age 84   Father  Deceased at age 82   Sister  Alive   Sister  Alive   Sister  Deceased    Family History  Problem Relation Age of Onset   Heart disease Mother        CHF   Bipolar disorder Mother  Heart disease Father        CHF   Allergies  Allergen Reactions   Bee Venom Anaphylaxis   Strawberry Extract Hives   Latex Itching   Zetia [Ezetimibe] Other (See Comments)    Intolerance    Adhesive [Tape] Other (See Comments)    blisters   Statins Other (See Comments)    myalgias      ROS    Objective:     BP (!) 151/82 (BP Location: Left Arm, Cuff Size: Normal)   Pulse 60   Temp 97.7 F (36.5 C) (Oral)   Resp 12   Ht 5\' 9"  (1.753 m)   Wt 171 lb 9.6 oz (77.8 kg)   SpO2 98%   BMI 25.34 kg/m  BP Readings from Last 3 Encounters:  12/21/22 (!) 151/82  12/12/22 (!) 150/77  11/09/22 122/60   Wt Readings from Last 3 Encounters:  12/21/22 171 lb 9.6 oz (77.8 kg)  11/09/22 170 lb (77.1 kg)  10/08/22 170 lb (77.1 kg)   SpO2 Readings from Last 3 Encounters:  12/21/22 98%  12/12/22 96%  11/09/22 98%      Physical Exam Vitals and nursing note reviewed.  Constitutional:      General: He is not in acute distress.    Appearance: Normal appearance. He is well-developed.  HENT:     Head: Normocephalic and atraumatic.     Right Ear: Tympanic membrane and external ear normal. There is no impacted cerumen.     Left Ear: Tympanic membrane and external ear normal. There is no impacted cerumen.     Ears:     Comments: Decreased hearing L ear and tenderness on exam  Eyes:     General: No scleral icterus.       Right eye: No discharge.        Left eye: No discharge.  Cardiovascular:     Rate and Rhythm: Normal rate and regular rhythm.     Heart sounds: No murmur heard. Pulmonary:     Effort: Pulmonary effort is normal. No respiratory distress.     Breath sounds: Normal breath sounds.  Musculoskeletal:        General: Normal range of motion.     Cervical back: Normal range of motion and neck supple.     Right lower leg: No edema.     Left lower leg:  No edema.  Skin:    General: Skin is warm and dry.  Neurological:     Mental Status: He is alert and oriented to person, place, and time.  Psychiatric:        Mood and Affect: Mood normal.        Behavior: Behavior normal.        Thought Content: Thought content normal.        Judgment: Judgment normal.      No results found for any visits on 12/21/22.    The ASCVD Risk score (Arnett DK, et al., 2019) failed to calculate for the following reasons:   The 2019 ASCVD risk score is only valid for ages 38 to 68   The patient has a prior MI or stroke diagnosis    Assessment & Plan:   Problem List Items Addressed This Visit   None Visit Diagnoses     Left ear pain    -  Primary   Relevant Medications   ofloxacin (FLOXIN) 0.3 % OTIC solution     Assessment and Plan    Left Ear  Pain and Hearing Loss: Recent history of ear irrigation at urgent care. Eardrum visible on examination, no redness noted. Patient reports soreness and hearing loss. -Prescribe antibiotic ear drops for potential irritation from recent irrigation. Use once daily in the left ear. -Continue Flonase for fluid management. -If no improvement, consider referral to Ear, Nose, and Throat specialist.  Right Ear: No current complaints. -Continue Flonase for fluid management.        Return if symptoms worsen or fail to improve.    Donato Schultz, DO

## 2022-12-22 ENCOUNTER — Ambulatory Visit: Payer: PPO

## 2022-12-22 DIAGNOSIS — R262 Difficulty in walking, not elsewhere classified: Secondary | ICD-10-CM

## 2022-12-22 DIAGNOSIS — R2689 Other abnormalities of gait and mobility: Secondary | ICD-10-CM | POA: Diagnosis not present

## 2022-12-22 DIAGNOSIS — I69354 Hemiplegia and hemiparesis following cerebral infarction affecting left non-dominant side: Secondary | ICD-10-CM

## 2022-12-22 DIAGNOSIS — I69318 Other symptoms and signs involving cognitive functions following cerebral infarction: Secondary | ICD-10-CM

## 2022-12-22 DIAGNOSIS — M6281 Muscle weakness (generalized): Secondary | ICD-10-CM

## 2022-12-22 DIAGNOSIS — R296 Repeated falls: Secondary | ICD-10-CM

## 2022-12-22 NOTE — Therapy (Signed)
OUTPATIENT PHYSICAL THERAPY NEURO TREATMENT NOTE  Patient Name: William [name] MRN: [id number] DOB:01/12/1941, 82 y.o., male Today's Date: 12/22/2022  PCP: Donato Schultz  DO REFERRING PROVIDER: same   END OF SESSION:  PT End of Session - 12/22/22 1531     Visit Number 45    Number of Visits 54    Date for PT Re-Evaluation 01/22/23    Authorization Type HTA    Progress Note Due on Visit 50    PT Start Time 1530    PT Stop Time 1615    PT Time Calculation (min) 45 min    Equipment Utilized During Treatment Gait belt    Activity Tolerance Patient tolerated treatment well    Behavior During Therapy WFL for tasks assessed/performed                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   HTN (hypertension) 07/19/2015   Hyperlipidemia    Major neurocognitive disorder 01/09/2014   Mild, related to stroke history   Nocturia    Renal insufficiency 06/25/2013   S/P carotid endarterectomy    BILATERAL ICA--  PATENT PER DUPLEX  05-19-2012   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/26/2017   Right calf   Urinary frequency    Vitamin D deficiency    Past Surgical History:  Procedure Laterality Date   APPENDECTOMY  AS CHILD   CARDIOVASCULAR STRESS TEST  03-27-2012  DR CRENSHAW   LOW RISK LEXISCAN STUDY-- PROBABLE NORMAL PERFUSION AND SOFT TISSUE ATTENUATION/  NO ISCHEMIA/ EF 51%   CAROTID ENDARTERECTOMY Bilateral LEFT  11-12-2008  DR GREG HAYES   RIGHT ICA  2006  ([facility])   CHOLECYSTECTOMY N/A 02/23/2022   Procedure: LAPAROSCOPIC CHOLECYSTECTOMY;   Surgeon: Quentin Ore, MD;  Location: [facility] OR;  Service: General;  Laterality: N/A;   CYSTOSCOPY W/ RETROGRADES Bilateral 06/22/2021   Procedure: CYSTOSCOPY WITH RETROGRADE PYELOGRAM;  Surgeon: Marcine Matar, MD;  Location: [facility];  Service: Urology;  Laterality: Bilateral;   CYSTOSCOPY WITH LITHOLAPAXY N/A 02/26/2013   Procedure: CYSTOSCOPY WITH LITHOLAPAXY;  Surgeon: Marcine Matar, MD;  Location: [facility];  Service: Urology;  Laterality: N/A;   ENDOSCOPIC RETROGRADE CHOLANGIOPANCREATOGRAPHY (ERCP) WITH PROPOFOL N/A 02/22/2022   Procedure: ENDOSCOPIC RETROGRADE CHOLANGIOPANCREATOGRAPHY (ERCP) WITH PROPOFOL;  Surgeon: Jeani Hawking, MD;  Location: [facility] ENDOSCOPY;  Service: Gastroenterology;  Laterality: N/A;   EYE SURGERY  Jan. 2016   cataract surgery both eyes   INGUINAL HERNIA REPAIR Right 11-08-2006   IR KYPHO EA ADDL LEVEL THORACIC OR LUMBAR  02/12/2021   IR RADIOLOGIST EVAL & MGMT  02/18/2021   MASS EXCISION N/A 03/03/2016   Procedure: EXCISION OF BACK  MASS;  Surgeon: Almond Lint, MD;  Location:  SURGERY CENTER;  Service: General;  Laterality: N/A;   MOHS SURGERY Left 1/ 2016   Dr Margo Aye-- Basal cell   PROSTATE SURGERY     REMOVAL OF STONES  02/22/2022   Procedure: REMOVAL OF STONES;  Surgeon: Jeani Hawking, MD;  Location: [facility] ENDOSCOPY;  Service: Gastroenterology;;   Dennison Mascot  02/22/2022   Procedure: Dennison Mascot;  Surgeon: Jeani Hawking, MD;  Location: [facility] ENDOSCOPY;  Service: Gastroenterology;;   TRANSURETHRAL RESECTION OF BLADDER TUMOR WITH MITOMYCIN-C N/A 06/22/2021   Procedure: TRANSURETHRAL RESECTION OF BLADDER TUMOR;  Surgeon: Marcine Matar, MD;  Location: [facility];  Service: Urology;  Laterality: N/A;   TRANSURETHRAL RESECTION OF PROSTATE N/A 02/26/2013   Procedure: TRANSURETHRAL RESECTION OF THE PROSTATE WITH GYRUS INSTRUMENTS;  Surgeon: Marcine Matar, MD;  Location: [facility];   Service: Urology;  Laterality: N/A;   TRANSURETHRAL RESECTION OF PROSTATE N/A 06/22/2021   Procedure: TRANSURETHRAL RESECTION OF THE PROSTATE (TURP);  Surgeon: Marcine Matar, MD;  Location: [facility];  Service: Urology;  Laterality: N/A;   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   Insomnia    Prediabetes    Acute 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 (HCC)    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   Transaminitis 07/27/2021   UTI (urinary tract infection) 07/27/2021   CVA (cerebral vascular accident) (HCC) 07/27/2021   Fall 07/27/2021   Hyperglycemia 07/27/2021   Cholelithiasis 07/27/2021   Hypoxia 07/27/2021   Nausea and vomiting 07/27/2021   Acute metabolic encephalopathy 07/27/2021   Normocytic anemia 07/27/2021   Chronic back pain 07/27/2021   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   Closed fracture of first lumbar vertebra with routine healing 02/03/2021   Closed fracture of multiple ribs 11/18/2020   Anxiety 01/29/2020   Leg pain, bilateral 01/29/2020   Ingrown toenail 07/13/2019   Lumbar spondylosis 05/02/2018   Pain in left knee 03/09/2018   Osteoarthritis of left hip 01/16/2018   Trochanteric bursitis of left hip 01/16/2018   Preventative health care 09/26/2017   HTN (hypertension) 07/19/2015   Hyperlipidemia 07/19/2015    Great toe pain 02/11/2014   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   Obesity (BMI 30-39.9) 06/25/2013   Renal insufficiency 06/25/2013   Weakness of left arm 06/25/2013   Sebaceous cyst 03/03/2011   Sprain of lumbar region 07/31/2010   Rib pain, left 08/29/2009   Carotid artery stenosis, asymptomatic, bilateral 05/02/2009   Eczema, atopic 05/31/2008   Vitamin D deficiency 03/01/2008   BPH (benign prostatic hyperplasia) 08/06/2007   Fasting hyperglycemia 12/21/2006   History of right MCA infarct 2006    ONSET DATE: 06/29/22 REFERRING DIAG:  Diagnosis  I63.9 (ICD-10-CM) - Cerebrovascular accident (CVA), unspecified mechanism (HCC)  W19.XXXD (ICD-10-CM) - Fall, subsequent encounter    THERAPY DIAG:  Muscle weakness (generalized)  Other symptoms and signs involving cognitive functions following cerebral infarction  Hemiplegia and hemiparesis following cerebral infarction affecting left non-dominant side (HCC)  Other abnormalities of gait and mobility  Repeated falls  Difficulty in walking, not elsewhere classified  Rationale for Evaluation and Treatment: Rehabilitation  SUBJECTIVE:  SUBJECTIVE STATEMENT:  Legs are a little achy today  PERTINENT HISTORY:  EMERIL STILLE is a 82 year old man with dementia, CKD, HTN, CAD, HLD and history of CVA  (2006, 2023), Parkinson's  PAIN:  Are you having pain? No  PRECAUTIONS: Fall  WEIGHT BEARING RESTRICTIONS: No  FALLS: Has patient fallen in last 6 months? No  LIVING ENVIRONMENT: Lives with: lives with their family and lives with their spouse Lives in: House/apartment Stairs:  1 brick high step Has following equipment at home: Environmental consultant - 2 wheeled, Wheelchair (manual), Shower bench, bed side commode, Grab bars, and hospital bed, sliding  pad for car  PLOF: Independent with basic ADLs and Independent with household mobility without device  PATIENT GOALS: Walk around the block with his wife. Be able to use the dining room chair rather than have to use the W/C. Increased I with bed mobility and simple tasks at home like make some coffee or brush his teeth, Step over tub to shower, has bench, but he can't really use it. Up and down the one step to get to back deck. 11/23/22: Patient's wife [name] updated his goals today: Walk around store (gets distracted), swing a golf club again (thinking of driving range), get up on his own, walk without leaning forward, more recognition of the left side (from CVA), not need gait belt, OT-- be able to get dressed more on his own, navigate the newspaper.  OBJECTIVE:  (Measures in this section from initial evaluation unless otherwise noted) DIAGNOSTIC FINDINGS:  MRI 2021 with degenerative changes in lumbar spine, L3-4 R subarticular stenosis COGNITION: Overall cognitive status: History of cognitive impairments - at baseline SENSATION: Not tested COORDINATION: Moderately impaired BLE MUSCLE LENGTH: Hamstrings: severely restricted B Thomas test: Severely restricted B. POSTURE: rounded shoulders, forward head, increased thoracic kyphosis, posterior pelvic tilt, and flexed trunk  LOWER EXTREMITY ROM:   BLE extremely stiff throughout, limited hip ROM in all planes, B knees limited in extension. LOWER EXTREMITY MMT:  3+/5 throughout. Unable to determine accuracy of MMT due to cognition. Functionally demosntrates poor coordinated activation and poor muscular endurance. BED MOBILITY: Min A for Supine<> Sit. Patient was using a ladder in his hospital bed and could move MI. TRANSFERS: Min A, mod VC, tends to lean back.  11/02/22 independent with use of hands on arm chair with a couple of tries CURB: Min A-Has one step out to his deck. STAIRS: N/A  GAIT: Gait pattern: step to pattern, decreased arm swing-  Right, decreased arm swing- Left, decreased step length- Right, decreased step length- Left, shuffling, festinating, trunk flexed, narrow BOS, poor foot clearance- Right, and poor foot clearance- Left Distance walked: 25' Assistive device utilized: Environmental consultant - 2 wheeled Level of assistance: Occasional min A Comments: mod VC to stay close to walker, take longer steps. FUNCTIONAL TESTS:  5 times sit to stand: 38.23  09/29/22  could not performs 5XSTS, 11/02/22 = 47 seconds with cues to use arms Timed up and go (TUG): 47.91  with RW, 07/21/22 TUG 29  seconds without device, 09/29/22 25 seconds, 11/02/22 TUG 20 seconds   TODAY'S TREATMENT: 12/22/22 Activity Comments  Pt began on NU-step for 10 min prior to start of session   Lunge to stand 2x10 -BUE support to improve lunge to stand for floor to stand recovery  Tall kneeling -reaching across midline 2x10 -quadruped: shoulder taps 1x10 -quadruped to tall kneeling 1x10, then with PWR up -quadruped reaching to items across midline 1x10  Gait training -  Techniques to facilitate increased step length use of visual targets to aim feet for stride length. Retro-walking w/ min A to facilitate hip          [facility] Adult PT Treatment:                                                DATE: 12/20/22 Therapeutic Exercise:  NuStep, Level 2, 4 extremities x 6 min, cues and occasional assist to increase step length for more knee extension 5x sit<>stand 34.5 sec with UE support Additional Sit to stand practice, with BUE support In parallel bars: Squat to stand, 3 x 5 Forward lunge to 6" block 3 x 5 reps each foot leading, cues through hips for increased weightshfit Forward/back walk at parallel bars  Seated hamstring stretch, 3 reps foot propped on 4" block Seated ankle dorsiflexion A/ROM, 5 reps, 3 sec  Gait training Indoor (40 ft x 2)/outdoor (100 ft)gait training without device, RUE support/HHA, with cues to "kick" with LLE for improved foot clearance.  Pt able to  achieve better foot clearance with cues.                                                                         PATIENT EDUCATION: Education details: Progress towards goals, POC Person educated: Patient and Spouse Education method: Explanation, Demonstration, and Verbal cues  Education comprehension: verbalized understanding, returned demonstration, verbal cues required, and needs further education  HOME EXERCISE PROGRAM: TBD  GOALS: Goals reviewed with patient? Yes  SHORT TERM GOALS: Target date: 12/22/22  Perform HEP for PWR! Moves with cues from family. Baseline: Pt's wife, [name], notes walking and hitting beach ball as main HEP. Goal status: GOAL MET, 12/20/2022   2. Decrease 5 x STS to < 35 sec to demonstrate improved functional strength  Baseline: 47 seconds on 5/21 per note; 34.5 sec UE support 12/20/2022  Goal status: GOAL MET, 12/20/2022   3. Perform sit <> stand transfers from average chair height with Supervision.  Baseline: CGA  12/20/2022  Goal status:  IN PROGRESS, 12/20/2022  LONG TERM GOALS: Target date: 01/22/23  Patient will perform HEP progression with caregiver assist. Baseline: has minimal HEP Goal status: REVISED  2.  Decrease 5 x STS to < 25 sec to demonstrate improved functional strength Baseline: 47 seconds on 5/21 per note Goal status: REVISED  3. Improve quality of gait to be able to ambulate x 100 feet with supervision. Baseline: CGA with cues  Goal status: REVISED  4.  Patient will perform his bed mobility with MI including rolling, sit<>supine, and scooting in bed. Baseline: on hard surface mat he is independent but on soft bed at home he struggles, wife reports only needs a hand to get up now 11/02/22 Goal status: REVISED  5.  Patient will demonstrate floor<>stand with CGA and verbal cues for patient's wife to be able to assist. Baseline:  Has to call 9-1-1 Goal status: NEW GOAL  ASSESSMENT:  CLINICAL IMPRESSION:  Focus on improving mobility  for floor to stand by segmental practice. Heavy tactile and visual cues for guidance  and sequence in transfers and gait.  Activities emphasis on crossing midline and attention to task.  Improved gait performance with use fo visual cues for increasing stride length.  Onset of fatigue towards end of session with decrease in postural stability and needing increased physical assistance for mobilty and safety  OBJECTIVE IMPAIRMENTS: Abnormal gait, decreased activity tolerance, decreased balance, decreased cognition, decreased coordination, decreased endurance, decreased mobility, difficulty walking, decreased ROM, decreased strength, decreased safety awareness, impaired flexibility, impaired UE functional use, improper body mechanics, and postural dysfunction.   PLAN:  PT FREQUENCY: 2x/week  PT DURATION: 12 weeks  PLANNED INTERVENTIONS: Therapeutic exercises, Therapeutic activity, Neuromuscular re-education, Balance training, Gait training, Patient/Family education, Self Care, Joint mobilization, Stair training, Moist heat, and Manual therapy  PLAN FOR NEXT SESSION: Practice components for floor>stand transfers.  Use NuStep at beginning of session-focus on increased stride length on Nustep.  Strength and flexibility exercises needed for floor>stand transfers.  Gait cues to improve stride and posture. Large amplitude training; wide BOS for stepping, forward/back walking, sidestepping.  Dion Body, PT 12/22/2022, 3:31 PM  [facility] [address] [place], [place], [address] Phone: [phone]   Fax:  [phone]

## 2022-12-24 ENCOUNTER — Encounter: Payer: Self-pay | Admitting: Family Medicine

## 2022-12-27 ENCOUNTER — Telehealth: Payer: Self-pay | Admitting: Family Medicine

## 2022-12-27 ENCOUNTER — Other Ambulatory Visit (HOSPITAL_BASED_OUTPATIENT_CLINIC_OR_DEPARTMENT_OTHER): Payer: Self-pay

## 2022-12-27 ENCOUNTER — Ambulatory Visit: Payer: PPO | Admitting: Occupational Therapy

## 2022-12-27 DIAGNOSIS — R2689 Other abnormalities of gait and mobility: Secondary | ICD-10-CM | POA: Diagnosis not present

## 2022-12-27 DIAGNOSIS — R278 Other lack of coordination: Secondary | ICD-10-CM

## 2022-12-27 DIAGNOSIS — M6281 Muscle weakness (generalized): Secondary | ICD-10-CM

## 2022-12-27 DIAGNOSIS — R41840 Attention and concentration deficit: Secondary | ICD-10-CM

## 2022-12-27 DIAGNOSIS — I69354 Hemiplegia and hemiparesis following cerebral infarction affecting left non-dominant side: Secondary | ICD-10-CM

## 2022-12-27 NOTE — Telephone Encounter (Signed)
Pt spouse dropped off documents to be filled out by provider ([facility] form) pt would like to be called when document ready to pick up Pt tel [phone]. Document put at front office tray under providers name.

## 2022-12-27 NOTE — Therapy (Signed)
OUTPATIENT OCCUPATIONAL THERAPY TREATMENT NOTE  Patient Name: William [name] MRN: [id number] DOB:07-15-1940, 82 y.o., male Today's Date: 12/27/2022  PCP & REFERRING PROVIDER:  [name], [name], DO    END OF SESSION:  OT End of Session - 12/27/22 1456     Visit Number 37    Number of Visits 47    Date for OT Re-Evaluation 01/25/23    Authorization Type [employer]    Progress Note Due on Visit 40    OT Start Time 1448    OT Stop Time 1530    OT Time Calculation (min) 42 min    Activity Tolerance Patient tolerated treatment well    Behavior During Therapy WFL for tasks assessed/performed                                   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   HTN (hypertension) 07/19/2015   Hyperlipidemia    Major neurocognitive disorder 01/09/2014   Mild, related to stroke history   Nocturia    Renal insufficiency 06/25/2013   S/P carotid endarterectomy    BILATERAL ICA--  PATENT PER DUPLEX  05-19-2012   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/26/2017   Right calf   Urinary frequency    Vitamin D deficiency    Past Surgical History:  Procedure Laterality Date   APPENDECTOMY  AS CHILD   CARDIOVASCULAR STRESS TEST  03-27-2012  DR CRENSHAW   LOW RISK LEXISCAN STUDY-- PROBABLE NORMAL PERFUSION AND SOFT TISSUE ATTENUATION/  NO ISCHEMIA/ EF 51%   CAROTID ENDARTERECTOMY Bilateral LEFT  11-12-2008  DR GREG HAYES   RIGHT ICA  2006  ([facility])   CHOLECYSTECTOMY N/A 02/23/2022   Procedure: LAPAROSCOPIC  CHOLECYSTECTOMY;  Surgeon: Quentin Ore, MD;  Location: [facility] OR;  Service: General;  Laterality: N/A;   CYSTOSCOPY W/ RETROGRADES Bilateral 06/22/2021   Procedure: CYSTOSCOPY WITH RETROGRADE PYELOGRAM;  Surgeon: Marcine Matar, MD;  Location: [facility];  Service: Urology;  Laterality: Bilateral;   CYSTOSCOPY WITH LITHOLAPAXY N/A 02/26/2013   Procedure: CYSTOSCOPY WITH LITHOLAPAXY;  Surgeon: Marcine Matar, MD;  Location: [facility];  Service: Urology;  Laterality: N/A;   ENDOSCOPIC RETROGRADE CHOLANGIOPANCREATOGRAPHY (ERCP) WITH PROPOFOL N/A 02/22/2022   Procedure: ENDOSCOPIC RETROGRADE CHOLANGIOPANCREATOGRAPHY (ERCP) WITH PROPOFOL;  Surgeon: Jeani Hawking, MD;  Location: [facility] ENDOSCOPY;  Service: Gastroenterology;  Laterality: N/A;   EYE SURGERY  Jan. 2016   cataract surgery both eyes   INGUINAL HERNIA REPAIR Right 11-08-2006   IR KYPHO EA ADDL LEVEL THORACIC OR LUMBAR  02/12/2021   IR RADIOLOGIST EVAL & MGMT  02/18/2021   MASS EXCISION N/A 03/03/2016   Procedure: EXCISION OF BACK  MASS;  Surgeon: Almond Lint, MD;  Location: [facility];  Service: General;  Laterality: N/A;   MOHS SURGERY Left 1/ 2016   Dr Margo Aye-- Basal cell   PROSTATE SURGERY  REMOVAL OF STONES  02/22/2022   Procedure: REMOVAL OF STONES;  Surgeon: Jeani Hawking, MD;  Location: [facility] ENDOSCOPY;  Service: Gastroenterology;;   Dennison Mascot  02/22/2022   Procedure: Dennison Mascot;  Surgeon: Jeani Hawking, MD;  Location: [facility] ENDOSCOPY;  Service: Gastroenterology;;   TRANSURETHRAL RESECTION OF BLADDER TUMOR WITH MITOMYCIN-C N/A 06/22/2021   Procedure: TRANSURETHRAL RESECTION OF BLADDER TUMOR;  Surgeon: Marcine Matar, MD;  Location: [facility];  Service: Urology;  Laterality: N/A;   TRANSURETHRAL RESECTION OF PROSTATE N/A 02/26/2013   Procedure: TRANSURETHRAL RESECTION OF THE PROSTATE WITH GYRUS INSTRUMENTS;  Surgeon: Marcine Matar, MD;  Location: [facility];  Service: Urology;  Laterality: N/A;   TRANSURETHRAL RESECTION OF PROSTATE N/A 06/22/2021   Procedure: TRANSURETHRAL RESECTION OF THE PROSTATE (TURP);  Surgeon: Marcine Matar, MD;  Location: [facility];  Service: Urology;  Laterality: N/A;   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   Insomnia    Prediabetes    Acute 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 (HCC)    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   Transaminitis 07/27/2021   UTI (urinary tract infection) 07/27/2021   CVA (cerebral vascular accident) (HCC) 07/27/2021   Fall 07/27/2021   Hyperglycemia 07/27/2021   Cholelithiasis 07/27/2021   Hypoxia 07/27/2021   Nausea and vomiting 07/27/2021   Acute metabolic encephalopathy 07/27/2021   Normocytic anemia 07/27/2021   Chronic back pain 07/27/2021   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   Closed fracture of first lumbar vertebra with routine healing 02/03/2021   Closed fracture of multiple ribs 11/18/2020   Anxiety 01/29/2020   Leg pain, bilateral 01/29/2020   Ingrown toenail 07/13/2019   Lumbar spondylosis 05/02/2018   Pain in left knee 03/09/2018   Osteoarthritis of left hip 01/16/2018   Trochanteric bursitis of left hip 01/16/2018   Preventative health care 09/26/2017   HTN (hypertension) 07/19/2015    Hyperlipidemia 07/19/2015   Great toe pain 02/11/2014   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   Obesity (BMI 30-39.9) 06/25/2013   Renal insufficiency 06/25/2013   Weakness of left arm 06/25/2013   Sebaceous cyst 03/03/2011   Sprain of lumbar region 07/31/2010   Rib pain, left 08/29/2009   Carotid artery stenosis, asymptomatic, bilateral 05/02/2009   Eczema, atopic 05/31/2008   Vitamin D deficiency 03/01/2008   BPH (benign prostatic hyperplasia) 08/06/2007   Fasting hyperglycemia 12/21/2006   History of right MCA infarct 2006    ONSET DATE: Acute exacerbation of chronic symptoms (most recent hospitalization 02/26/22 - 03/12/22, followed by SNF and home health)   REFERRING DIAG:  I63.9 (ICD-10-CM) - Cerebrovascular accident (CVA), unspecified mechanism (HCC)  W19.XXXD (ICD-10-CM) - Fall, subsequent encounter    THERAPY DIAG:  Hemiplegia and hemiparesis following cerebral infarction affecting left non-dominant side (HCC)  Other lack of coordination  Attention and concentration deficit  Muscle weakness (generalized)  Rationale for Evaluation  and Treatment: Rehabilitation  PERTINENT HISTORY: PMHx includes L basal ganglia and corona radiata infarct 07/27/21, L lower homonymous quadrantanopia; R MCA infarct in 2006 w/ L-sided hemiparesis, NCD, carotid artery disease (bilateral), HTN, HLD,CKD3, vascular dementia, and hx of bladder cancer. He has had falls in 2023, fracturing his clavicle in August, then he had stomach surgery in Sept 2023.  He needs assist at baseline for ADLs. When last documented in acute rehab after having his gall-bladder removed, (03/09/22) he required moderate to maximum assist x2 for most ADLs (more assist for ADLs the require standing), and they recommended rehab in skilled nursing setting. He apparently got Covid at some point and also attended a SNF. Once he D/C home, he had some episodes of combativeness at night  with "sun-downing."  He is  communicative and appropriate, though appears easily confused and distracted, not fully oriented to time and place (Ax2, states date is 84 and cannot say what city he lives in). His wife provides most details, and he is participative, though a poor historian at times. In a nut shell- he has been suffering from dementia and post-stroke effects for years, and his issues were recently exacerbated in Sept of last year after abdominal surgery. He has less use of Lt "stroke side" and new, ritualistic "ticks" in mouth and Rt hand/arm.  Wife states that they do have a caregiver come M, W, F for 4 hours in the middle of the day to mainly help him bathe and dress, which usually tires him out. He has been more combative towards the end of the day as well.   PRECAUTIONS: Fall and Other: Lt hemi, poor cognition, LATEX and adhesive allergies, etc.  ;WEIGHT BEARING RESTRICTIONS: No   SUBJECTIVE:   SUBJECTIVE STATEMENT: Spouse reports increased confusion, increased forward lean.  Pt accompanied by:  his wife   PAIN: no   FALLS: Has patient fallen in last 6 months? Yes. Number of falls 1 in last couple weeks and 2-3 near falls (backwards) when getting up from various height chairs   LIVING ENVIRONMENT: Lives with: lives with their spouse Lives in: House/apartment Has following equipment at home: manual w/c, 2 w/w, weighted utensils, shower bench (for tub), bed cane, hospital bed, raised toilet, and possibly more  PLOF: Needs assistance with ADLs, Needs assistance with homemaking, Needs assistance with gait, and Needs assistance with transfers  PATIENT GOALS: 11/30/22: to return to playing golf, increased attention (less distraction) to task to engage in ADLs and leisure pursuits, increase Lt arm usage   OBJECTIVE: (All objective assessments below are from initial evaluation on: 07/07/22 unless otherwise specified.)   HAND DOMINANCE: Right  ADLs: Overall ADLs: Wife states decreased ability since  hospitalization in Sept 23.  Transfers/ambulation related to ADLs: Min A transfer, CGA for mobility Eating: set up Grooming: Mod A- needs assist with oral care, shaving, but can comb his hair UB Dressing: Min A  LB Dressing: Dep  Toileting:  Mod  Bathing: Mod  Tub Shower transfers: Min A  Equipment: Emergency planning/management officer, Grab bars, and Feeding equipment  IADLs: completely dependent for years now, except some ability to use microwave which is now not possible after hospitalization   MOBILITY STATUS: Needs Assist: CGA - Min A, Hx of falls, Festination, difficulty with turns, and neglect on Lt side  POSTURE COMMENTS:  rounded shoulders, forward head, and weight shift left  ACTIVITY TOLERANCE: Activity tolerance: he became tired and somewhat agitated by the end of the OT evaluation today (after  PT and OT about 2.5hours with mostly sedate activities in seated positions)  FUNCTIONAL OUTCOME MEASURES: [facility] 17/30  showing problems with visuospatial/executive function, attention, fluency, and orientation. (at least 26/30 is needed to be considered "normal")  Trinna Balloon in ADLs: 1/6  (very dependent) 11/30/22: PPT#2: 21.79 sec  UPPER EXTREMITY ROM:  from initial eval  Active ROM Right eval Left eval Left 11/30/22  Shoulder scaption 100 85 85  Shoulder abduction   80  Shoulder adduction     Shoulder extension     Shoulder internal rotation Reaches small of back Reaches to hip Reaches to hip  Shoulder external rotation Reaches back of head  Reaches up to ear Reaches up to ear  Elbow flexion 150 130   Elbow extension (-10*)  (-35*)   Wrist flexion 35 20   Wrist extension 50 10   (Blank rows = not tested)  UPPER EXTREMITY MMT:    Tested grossly as "push" and "pull": UE push 4-/5, UE pull 4/5  HAND FUNCTION: Grip strength: Right: 30 lbs; Left: 20 lbs  COORDINATION: Finger Nose Finger test: mildly ataxic but fairly accurate  Box and Blocks:  Right 11blocks, Left  5blocks  MUSCLE TONE:  Rt- normal, Lt- decreased and worsened by inattention/disuse  COGNITION: Overall cognitive status: Impaired and Difficulty to assess due to: severity of deficits; Poor attention, fluency, orientation, and executive function. Recall is fairly good as well as abstraction.  Seems to have personality changes due to vascular dementia- like an OCD type volitional perseveration of tapping with Rt hand that he can somewhat control.  He did become angry and agitated at the end of the session performing car transfer with spouse: he did shout at her.  VISION: Subjective report: no new changes per pt/spouse, uses "cheaters"   VISION ASSESSMENT: Visual fields seem intact though he had latent responses on the left side showing likely inattention/and/neglect, tracking somewhat delayed horizontally and vertically  PERCEPTION: Impaired: Inattention/neglect: does not attend to left visual field and does not attend to left side of body, Body scheme: poor quality of recognition with shaving, etc., and Spatial orientation: decreased in print and in orientation, especially to Lt side  PRAXIS: Not tested specifically but no overt/stated issues using pen, wearing glasses ,etc.   OBSERVATIONS: Pt taps and flicks and does seem to volitionally perseverate on making a rhythm/noise with Rt hand. He states he is aware of it and chooses to do it.  He does also seem to have mild resting and intention tremor.    TODAY'S TREATMENT:     12/27/22 Engaged in structured activity with focus on sustained attention to task with navigating newspaper.  Pt initially flipping pages in paper at random both forward and backwards requiring mod cues to increase sequencing and question cues to recall what specific article he was searching for.  OT closing door as pt externally distracted by other therapist in treatment gym, noting mild improvements however still internally distracted.  Pt skimming article from R > L,  therefore OT encouraged use of L hand placement and/or colorful bookmark on L to facilitate visual scanning to L side of article.  Pt also missing occasional words on far L, also benefiting from stimulus on L to increase visual scanning.  Pt requiring 12 mins to locate sports article requiring mod cues, pt asking one time for assistance, therefore OT removing other sections of paper to decrease distraction.   Transitional movements: question cues provided for hand placement and counting for initiation  with sit > stand.  Pt able to complete sit > stand at end of session with only cues and no physical assistance.  Pt with one instance of catching toe on equipment on L due to inattention to L side of body and other therapist on R taking pt's attention.  OT providing min, stabilizing assist at gait belt and then cues for sustained attention with mobility.   12/21/22 Engaged in structured activity with focus on sustained attention to task, with goal of attending for 6 mins.  Pt engaged in Connect 4 task, attending for 1.5 mins however demonstrating impulsivity during task requiring frequent cues and redirection to alternate turns during activity.  Pt demonstrating decreased attention to L side, therefore OT modified task with placing Connect 4 grid further to R side of table and encouraging use of L hand with increased pacing but still with decreased motor control and again decreased attention to task. Large amplitude: engaged in reaching to targets at mirror, incorporating weight shifting and changing directions, to facilitate large amplitude reaching and attention to L body. Modifying target on L due to decreased ROM and shoulder tightness.   Shoulder ROM: engaged in shoulder shrugs, scapular retraction with manual facilitation, followed by use of dowels in BUE to facilitate large amplitude opposition.  OT facilitating with hand over hand on LUE to facilitate increased ROM and providing verbal and auditory cues to  facilitate increased sequence of forward/backward arm movements.    12/15/22 Bimanual task: attempted pipe tree puzzle with focus on incorporating use of LUE as well as L attention into simple pattern replication/building task.  Pt requiring hand over hand (on R hand) to facilitate increased use of LUE.  Pt requiring significantly increased time to complete simple pattern due to decreased sustained attention with both internal and external distractions. Large amplitude: engaged in scarf tossing with alternating R and L UE to facilitate increased spontaneous, sequential use of alternating UE.  Utilized Ship broker for increased visual feedback and therapist placed behind pt to maintain sequencing of task. Bimanual task: engaged in ball toss with BUE with focus on forced use of LUE.  OT providing demonstration, visual, and verbal cues for L hand positioning to increase incorporating into task. Attention: reviewed recommendations for decreasing external stimuli to increase success with self-feeding.  Recommending moving pt seat at table, closing blinds, and/or having wife eat with pt to facilitate increased attention to task.     PATIENT EDUCATION: Education details: ongoing condition specific education Person educated: Patient and Spouse Education method: Explanation, Demonstration, Tactile cues, and Verbal cues, handout Education comprehension: Explanation, demonstration, v.c  HOME EXERCISE PROGRAM: Access Code: [email or web address] URL: [email or web address] Date: 12/02/2022 Prepared by: [facility] - Outpatient  Rehab - [facility]  Exercises - Seated Shoulder Flexion Towel Slide at Table Top  - 2 x daily - 10-15 reps - Seated Dowel Chest Press  - 2 x daily - 10-15 reps - Seated Shoulder Flexion AAROM with Dowel  - 2 x daily - 10-15 reps - Dead Bug (IN SITTING)  - 2 x daily - 10 reps     GOALS: Goals reviewed with patient? Yes   SHORT TERM GOALS: (STG required if POC>30  days) Target Date: 12/28/22   Pt will demo/state understanding of HEP with focus on LUE ROM. Goal status: IN PROGRESS  2.  Pt will consistently sustain focus to a simple functional task  in a minimally distracting environment x 6 mins with no more than 1 re-direct(ie: eating, functional task  in clinic) Goal status: IN PROGRESS    LONG TERM GOALS: Target Date: 01/25/23  Pt will improve functional ability by decreased impairment per Trinna Balloon assessment from 1/6 to 2/6 or better, for better quality of life. Goal status:  IN PROGRESS  2.  He will show better functional use of left arm by improved box and blocks Test score from 5 blocks to at least 10 blocks. Baseline: 8 blocks on 11/16/22 Goal status: IN PROGRESS  3. Pt will don a flannel button front shirt consistently with min A and min v.c Baseline: mod A with mod vc Goal status:  IN PROGRESS  4.  Pt will use his LUE as an active assist for ADLS/leisure tasks grossly 40% of the time with min v.c Baseline: uses 25% Goal status:  IN PROGRESS  5.  Pt will open the newspaper and locate sports and used car section with no more than min v.c                           Goal status:   IN PROGRESS  6.  Pt will be able to get up/down from toilet with supervision and no cues.  Goal status: IN PROGRESS  7.  Pt will demonstrate improved sustained attention to complete breakfast in <1 hour  Baseline: currently requiring ~2 hours to complete eating breakfast Goal status: IN PROGRESS  ASSESSMENT:  CLINICAL IMPRESSION: Pt continues to demonstrate decreased spontaneous use of LUE during newspaper scavenger hunt, however did demonstrate 2 instances of spontaneous use.  Pt demonstrating decreased attention to tasks this session, spouse did report not giving him one of his medications today allowing him to make more eye contact but may be impacting his attention to task.  Pt with decreased L attention with table top and functional mobility,  requiring cues and assist for safety with mobility due to L inattention this session.  OT Frequency/DURATION: 2x week x 8 weeks   PLANNED INTERVENTIONS: self care/ADL training, therapeutic exercise, therapeutic activity, neuromuscular re-education, manual therapy, balance training, functional mobility training, moist heat, cryotherapy, patient/family education, cognitive remediation/compensation, visual/perceptual remediation/compensation, psychosocial skills training, energy conservation, coping strategies training, DME and/or AE instructions, and Re-evaluation  RECOMMENDED OTHER SERVICES: He has already seen PT for balance and functional mobility, he would probably benefit from speech for cognitive issues as well, though OT will be working with cognition somewhat functionally now.  CONSULTED AND AGREED WITH PLAN OF CARE: Patient and family member/caregiver  PLAN FOR NEXT SESSION: Review and add to HEP for L shoulder ROM, table top task for attention - newspaper, etc., initiate large amplitude HEP   Luby Seamans, OTR/L 3:08 PM 12/27/22

## 2022-12-28 ENCOUNTER — Ambulatory Visit: Payer: PPO

## 2022-12-28 DIAGNOSIS — M6281 Muscle weakness (generalized): Secondary | ICD-10-CM

## 2022-12-28 DIAGNOSIS — I69318 Other symptoms and signs involving cognitive functions following cerebral infarction: Secondary | ICD-10-CM

## 2022-12-28 DIAGNOSIS — R296 Repeated falls: Secondary | ICD-10-CM

## 2022-12-28 DIAGNOSIS — R262 Difficulty in walking, not elsewhere classified: Secondary | ICD-10-CM

## 2022-12-28 DIAGNOSIS — I69354 Hemiplegia and hemiparesis following cerebral infarction affecting left non-dominant side: Secondary | ICD-10-CM

## 2022-12-28 DIAGNOSIS — R2689 Other abnormalities of gait and mobility: Secondary | ICD-10-CM

## 2022-12-28 NOTE — Therapy (Signed)
OUTPATIENT PHYSICAL THERAPY NEURO TREATMENT NOTE  Patient Name: William [name] MRN: [id number] DOB:1940/08/11, 82 y.o., male Today's Date: 12/28/2022  PCP: Donato Schultz  DO REFERRING PROVIDER: same   END OF SESSION:  PT End of Session - 12/28/22 1445     Visit Number 46    Number of Visits 54    Date for PT Re-Evaluation 01/22/23    Authorization Type HTA    Progress Note Due on Visit 50    PT Start Time 1445    PT Stop Time 1530    PT Time Calculation (min) 45 min    Equipment Utilized During Treatment Gait belt    Activity Tolerance Patient tolerated treatment well    Behavior During Therapy WFL for tasks assessed/performed                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   HTN (hypertension) 07/19/2015   Hyperlipidemia    Major neurocognitive disorder 01/09/2014   Mild, related to stroke history   Nocturia    Renal insufficiency 06/25/2013   S/P carotid endarterectomy    BILATERAL ICA--  PATENT PER DUPLEX  05-19-2012   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   Right calf   Urinary frequency    Vitamin D deficiency    Past Surgical History:  Procedure Laterality Date   APPENDECTOMY  AS CHILD   CARDIOVASCULAR STRESS TEST  03-27-2012  DR CRENSHAW   LOW RISK LEXISCAN STUDY-- PROBABLE NORMAL PERFUSION AND SOFT TISSUE ATTENUATION/  NO ISCHEMIA/ EF 51%   CAROTID ENDARTERECTOMY Bilateral LEFT  11-12-2008  DR GREG HAYES   RIGHT ICA  2006  ([facility])   CHOLECYSTECTOMY N/A 02/23/2022   Procedure: LAPAROSCOPIC CHOLECYSTECTOMY;   Surgeon: Quentin Ore, MD;  Location: [facility] OR;  Service: General;  Laterality: N/A;   CYSTOSCOPY W/ RETROGRADES Bilateral 06/22/2021   Procedure: CYSTOSCOPY WITH RETROGRADE PYELOGRAM;  Surgeon: Marcine Matar, MD;  Location: [facility];  Service: Urology;  Laterality: Bilateral;   CYSTOSCOPY WITH LITHOLAPAXY N/A 02/26/2013   Procedure: CYSTOSCOPY WITH LITHOLAPAXY;  Surgeon: Marcine Matar, MD;  Location: [facility];  Service: Urology;  Laterality: N/A;   ENDOSCOPIC RETROGRADE CHOLANGIOPANCREATOGRAPHY (ERCP) WITH PROPOFOL N/A 02/22/2022   Procedure: ENDOSCOPIC RETROGRADE CHOLANGIOPANCREATOGRAPHY (ERCP) WITH PROPOFOL;  Surgeon: Jeani Hawking, MD;  Location: [facility] ENDOSCOPY;  Service: Gastroenterology;  Laterality: N/A;   EYE SURGERY  Jan. 2016   cataract surgery both eyes   INGUINAL HERNIA REPAIR Right 11-08-2006   IR KYPHO EA ADDL LEVEL THORACIC OR LUMBAR  02/12/2021   IR RADIOLOGIST EVAL & MGMT  02/18/2021   MASS EXCISION N/A 03/03/2016   Procedure: EXCISION OF BACK  MASS;  Surgeon: Almond Lint, MD;  Location: [facility];  Service: General;  Laterality: N/A;   MOHS SURGERY Left 1/ 2016   Dr Margo Aye-- Basal cell   PROSTATE SURGERY     REMOVAL OF STONES  02/22/2022   Procedure: REMOVAL OF STONES;  Surgeon: Jeani Hawking, MD;  Location: [facility] ENDOSCOPY;  Service: Gastroenterology;;   Dennison Mascot  02/22/2022   Procedure: Dennison Mascot;  Surgeon: Jeani Hawking, MD;  Location: [facility] ENDOSCOPY;  Service: Gastroenterology;;   TRANSURETHRAL RESECTION OF BLADDER TUMOR WITH MITOMYCIN-C N/A 06/22/2021   Procedure: TRANSURETHRAL RESECTION OF BLADDER TUMOR;  Surgeon: Marcine Matar, MD;  Location: [facility];  Service: Urology;  Laterality: N/A;   TRANSURETHRAL RESECTION OF PROSTATE N/A 02/26/2013   Procedure: TRANSURETHRAL RESECTION OF THE PROSTATE WITH GYRUS INSTRUMENTS;  Surgeon: Marcine Matar, MD;  Location: [facility];   Service: Urology;  Laterality: N/A;   TRANSURETHRAL RESECTION OF PROSTATE N/A 06/22/2021   Procedure: TRANSURETHRAL RESECTION OF THE PROSTATE (TURP);  Surgeon: Marcine Matar, MD;  Location: [facility];  Service: Urology;  Laterality: N/A;   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   Insomnia    Prediabetes    Acute 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 (HCC)    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   Transaminitis 07/27/2021   UTI (urinary tract infection) 07/27/2021   CVA (cerebral vascular accident) (HCC) 07/27/2021   Fall 07/27/2021   Hyperglycemia 07/27/2021   Cholelithiasis 07/27/2021   Hypoxia 07/27/2021   Nausea and vomiting 07/27/2021   Acute metabolic encephalopathy 07/27/2021   Normocytic anemia 07/27/2021   Chronic back pain 07/27/2021   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   Closed fracture of first lumbar vertebra with routine healing 02/03/2021   Closed fracture of multiple ribs 11/18/2020   Anxiety 01/29/2020   Leg pain, bilateral 01/29/2020   Ingrown toenail 07/13/2019   Lumbar spondylosis 05/02/2018   Pain in left knee 03/09/2018   Osteoarthritis of left hip 01/16/2018   Trochanteric bursitis of left hip 01/16/2018   Preventative health care 09/26/2017   HTN (hypertension) 07/19/2015   Hyperlipidemia 07/19/2015    Great toe pain 02/11/2014   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   Obesity (BMI 30-39.9) 06/25/2013   Renal insufficiency 06/25/2013   Weakness of left arm 06/25/2013   Sebaceous cyst 03/03/2011   Sprain of lumbar region 07/31/2010   Rib pain, left 08/29/2009   Carotid artery stenosis, asymptomatic, bilateral 05/02/2009   Eczema, atopic 05/31/2008   Vitamin D deficiency 03/01/2008   BPH (benign prostatic hyperplasia) 08/06/2007   Fasting hyperglycemia 12/21/2006   History of right MCA infarct 2006    ONSET DATE: 06/29/22 REFERRING DIAG:  Diagnosis  I63.9 (ICD-10-CM) - Cerebrovascular accident (CVA), unspecified mechanism (HCC)  W19.XXXD (ICD-10-CM) - Fall, subsequent encounter    THERAPY DIAG:  Hemiplegia and hemiparesis following cerebral infarction affecting left non-dominant side (HCC)  Muscle weakness (generalized)  Other symptoms and signs involving cognitive functions following cerebral infarction  Other abnormalities of gait and mobility  Repeated falls  Difficulty in walking, not elsewhere classified  Rationale for Evaluation and Treatment: Rehabilitation  SUBJECTIVE:  SUBJECTIVE STATEMENT:  Legs are a little sore  PERTINENT HISTORY:  NAZAR KUAN is a 82 year old man with dementia, CKD, HTN, CAD, HLD and history of CVA  (2006, 2023), Parkinson's  PAIN:  Are you having pain? No  PRECAUTIONS: Fall  WEIGHT BEARING RESTRICTIONS: No  FALLS: Has patient fallen in last 6 months? No  LIVING ENVIRONMENT: Lives with: lives with their family and lives with their spouse Lives in: House/apartment Stairs:  1 brick high step Has following equipment at home: Environmental consultant - 2 wheeled, Wheelchair (manual), Shower bench, bed side commode, Grab bars, and hospital bed, sliding pad  for car  PLOF: Independent with basic ADLs and Independent with household mobility without device  PATIENT GOALS: Walk around the block with his wife. Be able to use the dining room chair rather than have to use the W/C. Increased I with bed mobility and simple tasks at home like make some coffee or brush his teeth, Step over tub to shower, has bench, but he can't really use it. Up and down the one step to get to back deck. 11/23/22: Patient's wife [name] updated his goals today: Walk around store (gets distracted), swing a golf club again (thinking of driving range), get up on his own, walk without leaning forward, more recognition of the left side (from CVA), not need gait belt, OT-- be able to get dressed more on his own, navigate the newspaper.  OBJECTIVE:   TODAY'S TREATMENT: 12/28/22 Activity Comments  NU-step Inititated prior to session  Dynamic balance -golf swing activities -kicking physioball -bounce pass physioball -forward march 4x25 ft -retrowalk 4x25 ft -seated   LE strength -seated hamstring curls 3x10 20# -lateral step-up 1x10 6" box BUE support -  Transfer training Tactile cues for trunk flexion over BOS          (Measures in this section from initial evaluation unless otherwise noted) DIAGNOSTIC FINDINGS:  MRI 2021 with degenerative changes in lumbar spine, L3-4 R subarticular stenosis COGNITION: Overall cognitive status: History of cognitive impairments - at baseline SENSATION: Not tested COORDINATION: Moderately impaired BLE MUSCLE LENGTH: Hamstrings: severely restricted B Thomas test: Severely restricted B. POSTURE: rounded shoulders, forward head, increased thoracic kyphosis, posterior pelvic tilt, and flexed trunk  LOWER EXTREMITY ROM:   BLE extremely stiff throughout, limited hip ROM in all planes, B knees limited in extension. LOWER EXTREMITY MMT:  3+/5 throughout. Unable to determine accuracy of MMT due to cognition. Functionally demosntrates poor  coordinated activation and poor muscular endurance. BED MOBILITY: Min A for Supine<> Sit. Patient was using a ladder in his hospital bed and could move MI. TRANSFERS: Min A, mod VC, tends to lean back.  11/02/22 independent with use of hands on arm chair with a couple of tries CURB: Min A-Has one step out to his deck. STAIRS: N/A  GAIT: Gait pattern: step to pattern, decreased arm swing- Right, decreased arm swing- Left, decreased step length- Right, decreased step length- Left, shuffling, festinating, trunk flexed, narrow BOS, poor foot clearance- Right, and poor foot clearance- Left Distance walked: 96' Assistive device utilized: Environmental consultant - 2 wheeled Level of assistance: Occasional min A Comments: mod VC to stay close to walker, take longer steps. FUNCTIONAL TESTS:  5 times sit to stand: 38.23  09/29/22  could not performs 5XSTS, 11/02/22 = 47 seconds with cues to use arms Timed up and go (TUG): 47.91  with RW, 07/21/22 TUG 29  seconds without device, 09/29/22 25 seconds, 11/02/22 TUG 20 seconds  PATIENT EDUCATION: Education details: Progress towards goals, POC Person educated: Patient and Spouse Education method: Explanation, Demonstration, and Verbal cues  Education comprehension: verbalized understanding, returned demonstration, verbal cues required, and needs further education  HOME EXERCISE PROGRAM: TBD  GOALS: Goals reviewed with patient? Yes  SHORT TERM GOALS: Target date: 12/22/22  Perform HEP for PWR! Moves with cues from family. Baseline: Pt's wife, [name], notes walking and hitting beach ball as main HEP. Goal status: GOAL MET, 12/20/2022   2. Decrease 5 x STS to < 35 sec to demonstrate improved functional strength  Baseline: 47 seconds on 5/21 per note; 34.5 sec UE support 12/20/2022  Goal status: GOAL MET, 12/20/2022   3. Perform sit <> stand transfers from average chair height with  Supervision.  Baseline: CGA  12/20/2022  Goal status:  IN PROGRESS, 12/20/2022  LONG TERM GOALS: Target date: 01/22/23  Patient will perform HEP progression with caregiver assist. Baseline: has minimal HEP Goal status: REVISED  2.  Decrease 5 x STS to < 25 sec to demonstrate improved functional strength Baseline: 47 seconds on 5/21 per note Goal status: REVISED  3. Improve quality of gait to be able to ambulate x 100 feet with supervision. Baseline: CGA with cues  Goal status: REVISED  4.  Patient will perform his bed mobility with MI including rolling, sit<>supine, and scooting in bed. Baseline: on hard surface mat he is independent but on soft bed at home he struggles, wife reports only needs a hand to get up now 11/02/22 Goal status: REVISED  5.  Patient will demonstrate floor<>stand with CGA and verbal cues for patient's wife to be able to assist. Baseline:  Has to call 9-1-1 Goal status: NEW GOAL  ASSESSMENT:  CLINICAL IMPRESSION:  Activities with focus on improving dynamic balance and weight shifting with bias towards reactive balance strategies to facilitate righting reactions and reduce risk for falls.  Improved activity tolerance today with fewer rest periods required.  Frequent tactile/verbal cues for sequence and coordination with movements such as sit to stand requiring cues for trunk flexion over BOS and sequence for push to stand.  Tactile cues for left attention and incorporating limb use and focus. Continued sessions to progress motor control and coordination to enhance moility and reduce risk for falls  OBJECTIVE IMPAIRMENTS: Abnormal gait, decreased activity tolerance, decreased balance, decreased cognition, decreased coordination, decreased endurance, decreased mobility, difficulty walking, decreased ROM, decreased strength, decreased safety awareness, impaired flexibility, impaired UE functional use, improper body mechanics, and postural dysfunction.   PLAN:  PT  FREQUENCY: 2x/week  PT DURATION: 12 weeks  PLANNED INTERVENTIONS: Therapeutic exercises, Therapeutic activity, Neuromuscular re-education, Balance training, Gait training, Patient/Family education, Self Care, Joint mobilization, Stair training, Moist heat, and Manual therapy  PLAN FOR NEXT SESSION: Practice components for floor>stand transfers.  Use NuStep at beginning of session-focus on increased stride length on Nustep.  Strength and flexibility exercises needed for floor>stand transfers.  Gait cues to improve stride and posture. Large amplitude training; wide BOS for stepping, forward/back walking, sidestepping.  Dion Body, PT 12/28/2022, 2:46 PM  [facility] [address] [place], [place], [address] Phone: [phone]   Fax:  [phone]

## 2022-12-29 ENCOUNTER — Ambulatory Visit: Payer: PPO | Admitting: Rehabilitative and Restorative Service Providers"

## 2022-12-29 ENCOUNTER — Other Ambulatory Visit (HOSPITAL_BASED_OUTPATIENT_CLINIC_OR_DEPARTMENT_OTHER): Payer: Self-pay

## 2022-12-29 ENCOUNTER — Encounter: Payer: Self-pay | Admitting: Rehabilitative and Restorative Service Providers"

## 2022-12-29 DIAGNOSIS — M6281 Muscle weakness (generalized): Secondary | ICD-10-CM

## 2022-12-29 DIAGNOSIS — R41840 Attention and concentration deficit: Secondary | ICD-10-CM

## 2022-12-29 DIAGNOSIS — R2689 Other abnormalities of gait and mobility: Secondary | ICD-10-CM | POA: Diagnosis not present

## 2022-12-29 DIAGNOSIS — M25512 Pain in left shoulder: Secondary | ICD-10-CM

## 2022-12-29 DIAGNOSIS — I69354 Hemiplegia and hemiparesis following cerebral infarction affecting left non-dominant side: Secondary | ICD-10-CM

## 2022-12-29 DIAGNOSIS — R278 Other lack of coordination: Secondary | ICD-10-CM

## 2022-12-29 NOTE — Telephone Encounter (Signed)
Received. Placed in folder 

## 2022-12-29 NOTE — Therapy (Signed)
OUTPATIENT OCCUPATIONAL THERAPY TREATMENT NOTE  Patient Name: CHETAN MEHRING MRN: [id number] DOB:01-Apr-1941, 82 y.o., male Today's Date: 12/29/2022  PCP & REFERRING PROVIDER:  [name], [name], DO    END OF SESSION:  OT End of Session - 12/29/22 1448     Visit Number 38    Number of Visits 47    Date for OT Re-Evaluation 01/25/23    Authorization Type [employer]    Progress Note Due on Visit 40    OT Start Time 1448    OT Stop Time 1530    OT Time Calculation (min) 42 min    Activity Tolerance Patient tolerated treatment well;No increased pain;Patient limited by fatigue    Behavior During Therapy [facility] for tasks assessed/performed;Restless;Impulsive             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   HTN (hypertension) 07/19/2015   Hyperlipidemia    Major neurocognitive disorder 01/09/2014   Mild, related to stroke history   Nocturia    Renal insufficiency 06/25/2013   S/P carotid endarterectomy    BILATERAL ICA--  PATENT PER DUPLEX  05-19-2012   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   Right calf   Urinary frequency    Vitamin D deficiency    Past Surgical History:  Procedure Laterality Date   APPENDECTOMY  AS CHILD   CARDIOVASCULAR STRESS TEST  03-27-2012  DR CRENSHAW   LOW RISK LEXISCAN STUDY-- PROBABLE NORMAL PERFUSION AND SOFT TISSUE ATTENUATION/  NO ISCHEMIA/ EF 51%   CAROTID ENDARTERECTOMY Bilateral LEFT  11-12-2008  DR GREG HAYES   RIGHT ICA  2006  ([facility])   CHOLECYSTECTOMY N/A 02/23/2022   Procedure:  LAPAROSCOPIC CHOLECYSTECTOMY;  Surgeon: Quentin Ore, MD;  Location: [facility] OR;  Service: General;  Laterality: N/A;   CYSTOSCOPY W/ RETROGRADES Bilateral 06/22/2021   Procedure: CYSTOSCOPY WITH RETROGRADE PYELOGRAM;  Surgeon: Marcine Matar, MD;  Location: [facility];  Service: Urology;  Laterality: Bilateral;   CYSTOSCOPY WITH LITHOLAPAXY N/A 02/26/2013   Procedure: CYSTOSCOPY WITH LITHOLAPAXY;  Surgeon: Marcine Matar, MD;  Location: [facility];  Service: Urology;  Laterality: N/A;   ENDOSCOPIC RETROGRADE CHOLANGIOPANCREATOGRAPHY (ERCP) WITH PROPOFOL N/A 02/22/2022   Procedure: ENDOSCOPIC RETROGRADE CHOLANGIOPANCREATOGRAPHY (ERCP) WITH PROPOFOL;  Surgeon: Jeani Hawking, MD;  Location: [facility] ENDOSCOPY;  Service: Gastroenterology;  Laterality: N/A;   EYE SURGERY  Jan. 2016   cataract surgery both eyes   INGUINAL HERNIA REPAIR Right 11-08-2006   IR KYPHO EA ADDL LEVEL THORACIC OR LUMBAR  02/12/2021   IR RADIOLOGIST EVAL & MGMT  02/18/2021   MASS EXCISION N/A 03/03/2016   Procedure: EXCISION OF BACK  MASS;  Surgeon: Almond Lint, MD;  Location: [facility];  Service: General;  Laterality: N/A;   MOHS SURGERY Left 1/ 2016   Dr Margo Aye-- Basal cell   PROSTATE SURGERY     REMOVAL OF STONES  02/22/2022   Procedure: REMOVAL OF STONES;  Surgeon: Jeani Hawking,  MD;  Location: [facility] ENDOSCOPY;  Service: Gastroenterology;;   Dennison Mascot  02/22/2022   Procedure: Dennison Mascot;  Surgeon: Jeani Hawking, MD;  Location: [facility] ENDOSCOPY;  Service: Gastroenterology;;   TRANSURETHRAL RESECTION OF BLADDER TUMOR WITH MITOMYCIN-C N/A 06/22/2021   Procedure: TRANSURETHRAL RESECTION OF BLADDER TUMOR;  Surgeon: Marcine Matar, MD;  Location: [facility];  Service: Urology;  Laterality: N/A;   TRANSURETHRAL RESECTION OF PROSTATE N/A 02/26/2013   Procedure: TRANSURETHRAL RESECTION OF THE PROSTATE WITH GYRUS INSTRUMENTS;  Surgeon: Marcine Matar, MD;  Location:  [facility];  Service: Urology;  Laterality: N/A;   TRANSURETHRAL RESECTION OF PROSTATE N/A 06/22/2021   Procedure: TRANSURETHRAL RESECTION OF THE PROSTATE (TURP);  Surgeon: Marcine Matar, MD;  Location: [facility];  Service: Urology;  Laterality: N/A;   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   Insomnia    Prediabetes    Acute 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 (HCC)    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   Transaminitis 07/27/2021   UTI (urinary tract infection) 07/27/2021   CVA (cerebral vascular accident) (HCC) 07/27/2021   Fall 07/27/2021   Hyperglycemia 07/27/2021   Cholelithiasis 07/27/2021   Hypoxia 07/27/2021   Nausea and vomiting 07/27/2021   Acute metabolic encephalopathy 07/27/2021   Normocytic anemia 07/27/2021   Chronic back pain 07/27/2021   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   Closed fracture of first lumbar vertebra with routine healing 02/03/2021   Closed fracture of multiple ribs 11/18/2020   Anxiety 01/29/2020   Leg pain, bilateral 01/29/2020   Ingrown toenail 07/13/2019   Lumbar spondylosis 05/02/2018   Pain in left knee 03/09/2018   Osteoarthritis of left hip 01/16/2018   Trochanteric bursitis of left hip 01/16/2018   Preventative health care 09/26/2017   HTN (hypertension) 07/19/2015    Hyperlipidemia 07/19/2015   Great toe pain 02/11/2014   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   Obesity (BMI 30-39.9) 06/25/2013   Renal insufficiency 06/25/2013   Weakness of left arm 06/25/2013   Sebaceous cyst 03/03/2011   Sprain of lumbar region 07/31/2010   Rib pain, left 08/29/2009   Carotid artery stenosis, asymptomatic, bilateral 05/02/2009   Eczema, atopic 05/31/2008   Vitamin D deficiency 03/01/2008   BPH (benign prostatic hyperplasia) 08/06/2007   Fasting hyperglycemia 12/21/2006   History of right MCA infarct 2006    ONSET DATE: Acute exacerbation of chronic symptoms (most recent hospitalization 02/26/22 - 03/12/22, followed by SNF and home health)   REFERRING DIAG:  I63.9 (ICD-10-CM) - Cerebrovascular accident (CVA), unspecified mechanism (HCC)  W19.XXXD (ICD-10-CM) - Fall, subsequent encounter    THERAPY DIAG:  Muscle weakness (generalized)  Hemiplegia and hemiparesis following cerebral infarction affecting left non-dominant side (HCC)  Other lack of coordination  Acute pain of left shoulder  Attention and concentration deficit  Rationale for Evaluation and Treatment: Rehabilitation  PERTINENT HISTORY: PMHx includes L  basal ganglia and corona radiata infarct 07/27/21, L lower homonymous quadrantanopia; R MCA infarct in 2006 w/ L-sided hemiparesis, NCD, carotid artery disease (bilateral), HTN, HLD,CKD3, vascular dementia, and hx of bladder cancer. He has had falls in 2023, fracturing his clavicle in August, then he had stomach surgery in Sept 2023.  He needs assist at baseline for ADLs. When last documented in acute rehab after having his gall-bladder removed, (03/09/22) he required moderate to maximum assist x2 for most ADLs (more assist for ADLs the require standing), and they recommended rehab in skilled nursing setting. He apparently got Covid at some point and also attended a SNF. Once he D/C home, he had some episodes of combativeness at night  with  "sun-downing."  He is communicative and appropriate, though appears easily confused and distracted, not fully oriented to time and place (Ax2, states date is 50 and cannot say what city he lives in). His wife provides most details, and he is participative, though a poor historian at times. In a nut shell- he has been suffering from dementia and post-stroke effects for years, and his issues were recently exacerbated in Sept of last year after abdominal surgery. He has less use of Lt "stroke side" and new, ritualistic "ticks" in mouth and Rt hand/arm.  Wife states that they do have a caregiver come M, W, F for 4 hours in the middle of the day to mainly help him bathe and dress, which usually tires him out. He has been more combative towards the end of the day as well.   PRECAUTIONS: Fall and Other: Lt hemi, poor cognition, LATEX and adhesive allergies, etc.  ;WEIGHT BEARING RESTRICTIONS: No   SUBJECTIVE:   SUBJECTIVE STATEMENT: He states using his stick/cane every morning to stretch out his shoulders.   Pt accompanied by:  his wife   PAIN: no    FALLS: Has patient fallen in last 6 months? Yes. Number of falls 1 in last couple weeks and 2-3 near falls (backwards) when getting up from various height chairs   LIVING ENVIRONMENT: Lives with: lives with their spouse Lives in: House/apartment Has following equipment at home: manual w/c, 2 w/w, weighted utensils, shower bench (for tub), bed cane, hospital bed, raised toilet, and possibly more  PLOF: Needs assistance with ADLs, Needs assistance with homemaking, Needs assistance with gait, and Needs assistance with transfers  PATIENT GOALS: 11/30/22: to return to playing golf, increased attention (less distraction) to task to engage in ADLs and leisure pursuits, increase Lt arm usage   OBJECTIVE: (All objective assessments below are from initial evaluation on: 07/07/22 unless otherwise specified.)   HAND DOMINANCE: Right  ADLs: Overall ADLs:  Wife states decreased ability since hospitalization in Sept 23.  Transfers/ambulation related to ADLs: Min A transfer, CGA for mobility Eating: set up Grooming: Mod A- needs assist with oral care, shaving, but can comb his hair UB Dressing: Min A  LB Dressing: Dep  Toileting:  Mod  Bathing: Mod  Tub Shower transfers: Min A  Equipment: Emergency planning/management officer, Grab bars, and Feeding equipment  IADLs: completely dependent for years now, except some ability to use microwave which is now not possible after hospitalization   MOBILITY STATUS: Needs Assist: CGA - Min A, Hx of falls, Festination, difficulty with turns, and neglect on Lt side  POSTURE COMMENTS:  rounded shoulders, forward head, and weight shift left  ACTIVITY TOLERANCE: Activity tolerance: he became tired and somewhat agitated by the end of the OT evaluation today (after PT and  OT about 2.5hours with mostly sedate activities in seated positions)  FUNCTIONAL OUTCOME MEASURES: [facility] 17/30  showing problems with visuospatial/executive function, attention, fluency, and orientation. (at least 26/30 is needed to be considered "normal")  Trinna Balloon in ADLs: 1/6  (very dependent) 11/30/22: PPT#2: 21.79 sec  UPPER EXTREMITY ROM:  from initial eval  Active ROM Right eval Left eval Left 11/30/22 Lt 12/29/22  Shoulder scaption 100 85 85 95  Shoulder abduction   80 91  Shoulder adduction      Shoulder extension      Shoulder internal rotation Reaches small of back Reaches to hip Reaches to hip   Shoulder external rotation Reaches back of head  Reaches up to ear Reaches up to ear   Elbow flexion 150 130    Elbow extension (-10*)  (-35*)    Wrist flexion 35 20    Wrist extension 50 10    (Blank rows = not tested)  UPPER EXTREMITY MMT:    Tested grossly as "push" and "pull": UE push 4-/5, UE pull 4/5  HAND FUNCTION: 12/29/22: Grip Rt: 44#, Lt: 20#    Eval: Grip strength: Right: 30 lbs; Left: 20 lbs  COORDINATION: 12/29/22:  BBT Rt: 14 blocks, Lt: 8 blocks   Eval: Finger Nose Finger test: mildly ataxic but fairly accurate  Box and Blocks:  Right 11blocks, Left 5blocks  MUSCLE TONE:  Rt- normal, Lt- decreased and worsened by inattention/disuse  COGNITION: Overall cognitive status: Impaired and Difficulty to assess due to: severity of deficits; Poor attention, fluency, orientation, and executive function. Recall is fairly good as well as abstraction.  Seems to have personality changes due to vascular dementia- like an OCD type volitional perseveration of tapping with Rt hand that he can somewhat control.  He did become angry and agitated at the end of the session performing car transfer with spouse: he did shout at her.  VISION: Subjective report: no new changes per pt/spouse, uses "cheaters"   VISION ASSESSMENT: Visual fields seem intact though he had latent responses on the left side showing likely inattention/and/neglect, tracking somewhat delayed horizontally and vertically  PERCEPTION: Impaired: Inattention/neglect: does not attend to left visual field and does not attend to left side of body, Body scheme: poor quality of recognition with shaving, etc., and Spatial orientation: decreased in print and in orientation, especially to Lt side  PRAXIS: Not tested specifically but no overt/stated issues using pen, wearing glasses ,etc.   OBSERVATIONS: Pt taps and flicks and does seem to volitionally perseverate on making a rhythm/noise with Rt hand. He states he is aware of it and chooses to do it.  He does also seem to have mild resting and intention tremor.    TODAY'S TREATMENT:     12/29/22: For functional activities, he does coordination box and blocks activity, showing improvement in Rt arm but no change in Lt. He does some grip test and training and sh AROM with does show Lt arm imrpvement. OT then re-edu on shoulder stretches that he has learned in the past in flexion, ER, abd, and has him perform.  Next, he  does AA/ROM with wand and well as reach/grasp and coordinated activities at table top, including some large amplitude movements with review, finishing with fnl activity for catch/throw with light ~1lbs ball for VMS, GMS.  He leaves feeling some fatigue, no pain.    12/27/22 Engaged in structured activity with focus on sustained attention to task with navigating newspaper.  Pt initially flipping pages in  paper at random both forward and backwards requiring mod cues to increase sequencing and question cues to recall what specific article he was searching for.  OT closing door as pt externally distracted by other therapist in treatment gym, noting mild improvements however still internally distracted.  Pt skimming article from R > L, therefore OT encouraged use of L hand placement and/or colorful bookmark on L to facilitate visual scanning to L side of article.  Pt also missing occasional words on far L, also benefiting from stimulus on L to increase visual scanning.  Pt requiring 12 mins to locate sports article requiring mod cues, pt asking one time for assistance, therefore OT removing other sections of paper to decrease distraction.   Transitional movements: question cues provided for hand placement and counting for initiation with sit > stand.  Pt able to complete sit > stand at end of session with only cues and no physical assistance.  Pt with one instance of catching toe on equipment on L due to inattention to L side of body and other therapist on R taking pt's attention.  OT providing min, stabilizing assist at gait belt and then cues for sustained attention with mobility.    PATIENT EDUCATION: Education details: ongoing condition specific education Person educated: Patient and Spouse Education method: Explanation, Demonstration, Tactile cues, and Verbal cues, handout Education comprehension: Explanation, demonstration, v.c  HOME EXERCISE PROGRAM: Access Code: [email or web address] URL:  [email or web address] Date: 12/02/2022 Prepared by: [facility] - Outpatient  Rehab - [facility]  Exercises - Seated Shoulder Flexion Towel Slide at Table Top  - 2 x daily - 10-15 reps - Seated Dowel Chest Press  - 2 x daily - 10-15 reps - Seated Shoulder Flexion AAROM with Dowel  - 2 x daily - 10-15 reps - Dead Bug (IN SITTING)  - 2 x daily - 10 reps    GOALS: Goals reviewed with patient? Yes   SHORT TERM GOALS: (STG required if POC>30 days) Target Date: 12/28/22   Pt will demo/state understanding of HEP with focus on LUE ROM. Goal status: IN PROGRESS  2.  Pt will consistently sustain focus to a simple functional task  in a minimally distracting environment x 6 mins with no more than 1 re-direct(ie: eating, functional task in clinic) Goal status: IN PROGRESS    LONG TERM GOALS: Target Date: 01/25/23  Pt will improve functional ability by decreased impairment per Trinna Balloon assessment from 1/6 to 2/6 or better, for better quality of life. Goal status:  IN PROGRESS  2.  He will show better functional use of left arm by improved box and blocks Test score from 5 blocks to at least 10 blocks. Baseline: 8 blocks on 11/16/22 Goal status: IN PROGRESS  3. Pt will don a flannel button front shirt consistently with min A and min v.c Baseline: mod A with mod vc Goal status:  IN PROGRESS  4.  Pt will use his LUE as an active assist for ADLS/leisure tasks grossly 40% of the time with min v.c Baseline: uses 25% Goal status:  IN PROGRESS  5.  Pt will open the newspaper and locate sports and used car section with no more than min v.c                           Goal status:   IN PROGRESS  6.  Pt will be able to get up/down from toilet with supervision and no cues.  Goal  status: IN PROGRESS  7.  Pt will demonstrate improved sustained attention to complete breakfast in <1 hour  Baseline: currently requiring ~2 hours to complete eating breakfast Goal status: IN  PROGRESS  ASSESSMENT:  CLINICAL IMPRESSION: 12/29/22: He was highly distractible today. He participates well today, however unfortunately not showing much signs of significant improvement or increased self-motivation/new personal goals since initially starting therapy >4 months ago.  He does seem to have some increase Lt sided neglect and distractibility compared to past sessions months ago.    12/27/22: Pt continues to demonstrate decreased spontaneous use of LUE during newspaper scavenger hunt, however did demonstrate 2 instances of spontaneous use.  Pt demonstrating decreased attention to tasks this session, spouse did report not giving him one of his medications today allowing him to make more eye contact but may be impacting his attention to task.  Pt with decreased L attention with table top and functional mobility, requiring cues and assist for safety with mobility due to L inattention this session.  OT Frequency/DURATION: 2x week x 8 weeks   PLANNED INTERVENTIONS: self care/ADL training, therapeutic exercise, therapeutic activity, neuromuscular re-education, manual therapy, balance training, functional mobility training, moist heat, cryotherapy, patient/family education, cognitive remediation/compensation, visual/perceptual remediation/compensation, psychosocial skills training, energy conservation, coping strategies training, DME and/or AE instructions, and Re-evaluation  RECOMMENDED OTHER SERVICES: He has already seen PT for balance and functional mobility, he would probably benefit from speech for cognitive issues as well, though OT will be working with cognition somewhat functionally now.  CONSULTED AND AGREED WITH PLAN OF CARE: Patient and family member/caregiver  PLAN FOR NEXT SESSION:  Continue on as appropriate, try to draw attention to tasks and Lt side more     Fannie Knee, OTR/L 3:34 PM 12/29/22

## 2022-12-30 ENCOUNTER — Ambulatory Visit: Payer: PPO | Admitting: Physical Therapy

## 2022-12-30 DIAGNOSIS — R2689 Other abnormalities of gait and mobility: Secondary | ICD-10-CM

## 2022-12-30 DIAGNOSIS — M6281 Muscle weakness (generalized): Secondary | ICD-10-CM

## 2022-12-30 DIAGNOSIS — I69318 Other symptoms and signs involving cognitive functions following cerebral infarction: Secondary | ICD-10-CM

## 2022-12-30 NOTE — Therapy (Signed)
OUTPATIENT PHYSICAL THERAPY NEURO TREATMENT NOTE  Patient Name: William [name] MRN: [id number] DOB:30-Jul-1940, 82 y.o., male Today's Date: 12/31/2022  PCP: Donato Schultz  DO REFERRING PROVIDER: same   END OF SESSION:  PT End of Session - 12/31/22 0754     Visit Number 47    Number of Visits 54    Date for PT Re-Evaluation 01/22/23    Authorization Type HTA    Progress Note Due on Visit 50    PT Start Time 1446    PT Stop Time 1528    PT Time Calculation (min) 42 min    Equipment Utilized During Treatment Gait belt    Activity Tolerance Patient tolerated treatment well    Behavior During Therapy WFL for tasks assessed/performed                 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   HTN (hypertension) 07/19/2015   Hyperlipidemia    Major neurocognitive disorder 01/09/2014   Mild, related to stroke history   Nocturia    Renal insufficiency 06/25/2013   S/P carotid endarterectomy    BILATERAL ICA--  PATENT PER DUPLEX  05-19-2012   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   Right calf   Urinary frequency    Vitamin D deficiency    Past Surgical History:  Procedure Laterality Date   APPENDECTOMY  AS CHILD   CARDIOVASCULAR STRESS TEST  03-27-2012  DR CRENSHAW   LOW RISK LEXISCAN STUDY-- PROBABLE NORMAL PERFUSION AND SOFT TISSUE ATTENUATION/  NO ISCHEMIA/ EF 51%   CAROTID ENDARTERECTOMY Bilateral LEFT  11-12-2008  DR GREG HAYES   RIGHT ICA  2006  ([facility])   CHOLECYSTECTOMY N/A 02/23/2022   Procedure: LAPAROSCOPIC CHOLECYSTECTOMY;   Surgeon: Quentin Ore, MD;  Location: [facility] OR;  Service: General;  Laterality: N/A;   CYSTOSCOPY W/ RETROGRADES Bilateral 06/22/2021   Procedure: CYSTOSCOPY WITH RETROGRADE PYELOGRAM;  Surgeon: Marcine Matar, MD;  Location: [facility];  Service: Urology;  Laterality: Bilateral;   CYSTOSCOPY WITH LITHOLAPAXY N/A 02/26/2013   Procedure: CYSTOSCOPY WITH LITHOLAPAXY;  Surgeon: Marcine Matar, MD;  Location: [facility];  Service: Urology;  Laterality: N/A;   ENDOSCOPIC RETROGRADE CHOLANGIOPANCREATOGRAPHY (ERCP) WITH PROPOFOL N/A 02/22/2022   Procedure: ENDOSCOPIC RETROGRADE CHOLANGIOPANCREATOGRAPHY (ERCP) WITH PROPOFOL;  Surgeon: Jeani Hawking, MD;  Location: [facility] ENDOSCOPY;  Service: Gastroenterology;  Laterality: N/A;   EYE SURGERY  Jan. 2016   cataract surgery both eyes   INGUINAL HERNIA REPAIR Right 11-08-2006   IR KYPHO EA ADDL LEVEL THORACIC OR LUMBAR  02/12/2021   IR RADIOLOGIST EVAL & MGMT  02/18/2021   MASS EXCISION N/A 03/03/2016   Procedure: EXCISION OF BACK  MASS;  Surgeon: Almond Lint, MD;  Location: [facility];  Service: General;  Laterality: N/A;   MOHS SURGERY Left 1/ 2016   Dr Margo Aye-- Basal cell   PROSTATE SURGERY     REMOVAL OF STONES  02/22/2022   Procedure: REMOVAL OF STONES;  Surgeon: Jeani Hawking, MD;  Location: [facility] END[facility]COPY;  Service: Gastroenterology;;   Dennison Mascot  02/22/2022   Procedure: Dennison Mascot;  Surgeon: Jeani Hawking, MD;  Location: [facility] ENDOSCOPY;  Service: Gastroenterology;;   TRANSURETHRAL RESECTION OF BLADDER TUMOR WITH MITOMYCIN-C N/A 06/22/2021   Procedure: TRANSURETHRAL RESECTION OF BLADDER TUMOR;  Surgeon: Marcine Matar, MD;  Location: [facility];  Service: Urology;  Laterality: N/A;   TRANSURETHRAL RESECTION OF PROSTATE N/A 02/26/2013   Procedure: TRANSURETHRAL RESECTION OF THE PROSTATE WITH GYRUS INSTRUMENTS;  Surgeon: Marcine Matar, MD;  Location: [facility];   Service: Urology;  Laterality: N/A;   TRANSURETHRAL RESECTION OF PROSTATE N/A 06/22/2021   Procedure: TRANSURETHRAL RESECTION OF THE PROSTATE (TURP);  Surgeon: Marcine Matar, MD;  Location: [facility];  Service: Urology;  Laterality: N/A;   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   Insomnia    Prediabetes    Acute 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 (HCC)    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   Transaminitis 07/27/2021   UTI (urinary tract infection) 07/27/2021   CVA (cerebral vascular accident) (HCC) 07/27/2021   Fall 07/27/2021   Hyperglycemia 07/27/2021   Cholelithiasis 07/27/2021   Hypoxia 07/27/2021   Nausea and vomiting 07/27/2021   Acute metabolic encephalopathy 07/27/2021   Normocytic anemia 07/27/2021   Chronic back pain 07/27/2021   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   Closed fracture of first lumbar vertebra with routine healing 02/03/2021   Closed fracture of multiple ribs 11/18/2020   Anxiety 01/29/2020   Leg pain, bilateral 01/29/2020   Ingrown toenail 07/13/2019   Lumbar spondylosis 05/02/2018   Pain in left knee 03/09/2018   Osteoarthritis of left hip 01/16/2018   Trochanteric bursitis of left hip 01/16/2018   Preventative health care 09/26/2017   HTN (hypertension) 07/19/2015   Hyperlipidemia 07/19/2015    Great toe pain 02/11/2014   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   Obesity (BMI 30-39.9) 06/25/2013   Renal insufficiency 06/25/2013   Weakness of left arm 06/25/2013   Sebaceous cyst 03/03/2011   Sprain of lumbar region 07/31/2010   Rib pain, left 08/29/2009   Carotid artery stenosis, asymptomatic, bilateral 05/02/2009   Eczema, atopic 05/31/2008   Vitamin D deficiency 03/01/2008   BPH (benign prostatic hyperplasia) 08/06/2007   Fasting hyperglycemia 12/21/2006   History of right MCA infarct 2006    ONSET DATE: 06/29/22 REFERRING DIAG:  Diagnosis  I63.9 (ICD-10-CM) - Cerebrovascular accident (CVA), unspecified mechanism (HCC)  W19.XXXD (ICD-10-CM) - Fall, subsequent encounter    THERAPY DIAG:  Muscle weakness (generalized)  Other symptoms and signs involving cognitive functions following cerebral infarction  Other abnormalities of gait and mobility  Rationale for Evaluation and Treatment: Rehabilitation  SUBJECTIVE:  SUBJECTIVE STATEMENT:  Legs are a little sore, back is a little sore.  Wife reports posture is more forward when he stands and walks.  PERTINENT HISTORY:  William [name] is a 81 year old man with dementia, CKD, HTN, CAD, HLD and history of CVA  (2006, 2023), Parkinson's  PAIN:  Are you having pain? No  PRECAUTIONS: Fall  WEIGHT BEARING RESTRICTIONS: No  FALLS: Has patient fallen in last 6 months? No  LIVING ENVIRONMENT: Lives with: lives with their family and lives with their spouse Lives in: House/apartment Stairs:  1 brick high step Has following equipment at home: Environmental consultant - 2 wheeled, Wheelchair (manual), Shower bench, bed side commode, Grab bars, and hospital bed, sliding pad for car  PLOF: Independent with basic ADLs and Independent with household  mobility without device  PATIENT GOALS: Walk around the block with his wife. Be able to use the dining room chair rather than have to use the W/C. Increased I with bed mobility and simple tasks at home like make some coffee or brush his teeth, Step over tub to shower, has bench, but he can't really use it. Up and down the one step to get to back deck. 11/23/22: Patient's wife William [name] updated his goals today: Walk around store (gets distracted), swing a golf club again (thinking of driving range), get up on his own, walk without leaning forward, more recognition of the left side (from CVA), not need gait belt, OT-- be able to get dressed more on his own, navigate the newspaper.  OBJECTIVE:    TODAY'S TREATMENT: 12/30/2022 Activity Comments  NuStep, Level 4, 4 extremities x 6 minutes Cues for long stride push, SPM up to 80 (increases from 40's with short strides)  Standing mini squats at parallel bars, 10 reps   Forward step ups to 6" step, each leg leading 8-10 reps   Postural strengthening in standing:   -forward gentle squat to upright posture  -Reach across to touch target to L, then return to midline with cues to reset posture Tactile and verbal cues  Seated postural stretch, forward lean Lateral lean   Seated posture strength: Push into ball, then PWR! Up Tactile and verbal cues    PATIENT EDUCATION: Education details: strategies for visual, verbal, tactile cues for improved posture Person educated: Patient and Spouse Education method: Explanation, Demonstration, Tactile cues, and Verbal cues Education comprehension: verbalized understanding, returned demonstration, verbal cues required, tactile cues required, and needs further education    (Measures in this section from initial evaluation unless otherwise noted) DIAGNOSTIC FINDINGS:  MRI 2021 with degenerative changes in lumbar spine, L3-4 R subarticular stenosis COGNITION: Overall cognitive status: History of cognitive impairments -  at baseline SENSATION: Not tested COORDINATION: Moderately impaired BLE MUSCLE LENGTH: Hamstrings: severely restricted B Thomas test: Severely restricted B. POSTURE: rounded shoulders, forward head, increased thoracic kyphosis, posterior pelvic tilt, and flexed trunk  LOWER EXTREMITY ROM:   BLE extremely stiff throughout, limited hip ROM in all planes, B knees limited in extension. LOWER EXTREMITY MMT:  3+/5 throughout. Unable to determine accuracy of MMT due to cognition. Functionally demosntrates poor coordinated activation and poor muscular endurance. BED MOBILITY: Min A for Supine<> Sit. Patient was using a ladder in his hospital bed and could move MI. TRANSFERS: Min A, mod VC, tends to lean back.  11/02/22 independent with use of hands on arm chair with a couple of tries CURB: Min A-Has one step out to his deck. STAIRS: N/A  GAIT: Gait pattern: step  to pattern, decreased arm swing- Right, decreased arm swing- Left, decreased step length- Right, decreased step length- Left, shuffling, festinating, trunk flexed, narrow BOS, poor foot clearance- Right, and poor foot clearance- Left Distance walked: 56' Assistive device utilized: Environmental consultant - 2 wheeled Level of assistance: Occasional min A Comments: mod VC to stay close to walker, take longer steps. FUNCTIONAL TESTS:  5 times sit to stand: 38.23  09/29/22  could not performs 5XSTS, 11/02/22 = 47 seconds with cues to use arms Timed up and go (TUG): 47.91  with RW, 07/21/22 TUG 29  seconds without device, 09/29/22 25 seconds, 11/02/22 TUG 20 seconds                                                                            PATIENT EDUCATION: Education details: Progress towards goals, POC Person educated: Patient and Spouse Education method: Programmer, multimedia, Facilities manager, and Verbal cues  Education comprehension: verbalized understanding, returned demonstration, verbal cues required, and needs further education  HOME EXERCISE  PROGRAM: TBD  GOALS: Goals reviewed with patient? Yes  SHORT TERM GOALS: Target date: 12/22/22  Perform HEP for PWR! Moves with cues from family. Baseline: Pt's wife, William [name], notes walking and hitting beach ball as main HEP. Goal status: GOAL MET, 12/20/2022   2. Decrease 5 x STS to < 35 sec to demonstrate improved functional strength  Baseline: 47 seconds on 5/21 per note; 34.5 sec UE support 12/20/2022  Goal status: GOAL MET, 12/20/2022   3. Perform sit <> stand transfers from average chair height with Supervision.  Baseline: CGA  12/20/2022  Goal status:  IN PROGRESS, 12/20/2022  LONG TERM GOALS: Target date: 01/22/23  Patient will perform HEP progression with caregiver assist. Baseline: has minimal HEP Goal status: REVISED  2.  Decrease 5 x STS to < 25 sec to demonstrate improved functional strength Baseline: 47 seconds on 5/21 per note Goal status: REVISED  3. Improve quality of gait to be able to ambulate x 100 feet with supervision. Baseline: CGA with cues  Goal status: REVISED  4.  Patient will perform his bed mobility with MI including rolling, sit<>supine, and scooting in bed. Baseline: on hard surface mat he is independent but on soft bed at home he struggles, wife reports only needs a hand to get up now 11/02/22 Goal status: REVISED  5.  Patient will demonstrate floor<>stand with CGA and verbal cues for patient's wife to be able to assist. Baseline:  Has to call 9-1-1 Goal status: NEW GOAL  ASSESSMENT:  CLINICAL IMPRESSION:  Skilled PT session today focused on lower extremity and postural strengthening.  Wife reports he is having more forward posture with standing and gait.  Worked on postural strengthening, but also educated wife in strategies to help with reminder cues (tactile in addition to verbal) to improve standing posture.  Pt participates in session well, but is fatigued throughout.  He will continue to benefit from skilled PT towards goals for improved functional  mobility, decreased caregiver burden.  OBJECTIVE IMPAIRMENTS: Abnormal gait, decreased activity tolerance, decreased balance, decreased cognition, decreased coordination, decreased endurance, decreased mobility, difficulty walking, decreased ROM, decreased strength, decreased safety awareness, impaired flexibility, impaired UE functional use, improper body mechanics, and postural dysfunction.  PLAN:  PT FREQUENCY: 2x/week  PT DURATION: 12 weeks  PLANNED INTERVENTIONS: Therapeutic exercises, Therapeutic activity, Neuromuscular re-education, Balance training, Gait training, Patient/Family education, Self Care, Joint mobilization, Stair training, Moist heat, and Manual therapy  PLAN FOR NEXT SESSION: Work towards LTGs-need to discuss POC with wife.  Practice components for floor>stand transfers.  Use NuStep at beginning of session-focus on increased stride length on Nustep.  Strength and flexibility exercises needed for floor>stand transfers.  Gait cues to improve stride and posture. Large amplitude training; wide BOS for stepping, forward/back walking, sidestepping.  Chenise Mulvihill W., PT 12/31/2022, 7:55 AM  [facility] [address] [place], [place], [address] Phone: [phone]   Fax:  [phone]

## 2022-12-31 ENCOUNTER — Other Ambulatory Visit (HOSPITAL_BASED_OUTPATIENT_CLINIC_OR_DEPARTMENT_OTHER): Payer: Self-pay

## 2022-12-31 ENCOUNTER — Telehealth: Payer: Self-pay | Admitting: Family Medicine

## 2022-12-31 ENCOUNTER — Encounter: Payer: Self-pay | Admitting: Physical Therapy

## 2023-01-02 ENCOUNTER — Other Ambulatory Visit: Payer: Self-pay | Admitting: Family Medicine

## 2023-01-03 ENCOUNTER — Other Ambulatory Visit (HOSPITAL_BASED_OUTPATIENT_CLINIC_OR_DEPARTMENT_OTHER): Payer: Self-pay

## 2023-01-03 ENCOUNTER — Other Ambulatory Visit (HOSPITAL_COMMUNITY): Payer: Self-pay

## 2023-01-03 ENCOUNTER — Other Ambulatory Visit: Payer: Self-pay

## 2023-01-03 ENCOUNTER — Other Ambulatory Visit: Payer: Self-pay | Admitting: Family Medicine

## 2023-01-03 ENCOUNTER — Ambulatory Visit: Payer: PPO | Admitting: Occupational Therapy

## 2023-01-03 DIAGNOSIS — I69354 Hemiplegia and hemiparesis following cerebral infarction affecting left non-dominant side: Secondary | ICD-10-CM

## 2023-01-03 DIAGNOSIS — R278 Other lack of coordination: Secondary | ICD-10-CM

## 2023-01-03 DIAGNOSIS — R2689 Other abnormalities of gait and mobility: Secondary | ICD-10-CM | POA: Diagnosis not present

## 2023-01-03 DIAGNOSIS — M25512 Pain in left shoulder: Secondary | ICD-10-CM

## 2023-01-03 DIAGNOSIS — M6281 Muscle weakness (generalized): Secondary | ICD-10-CM

## 2023-01-03 DIAGNOSIS — R41840 Attention and concentration deficit: Secondary | ICD-10-CM

## 2023-01-03 MED ORDER — HYDRALAZINE HCL 25 MG PO TABS
25.0000 mg | ORAL_TABLET | Freq: Three times a day (TID) | ORAL | 1 refills | Status: DC
  Filled 2023-01-03: qty 270, 90d supply, fill #0

## 2023-01-03 MED ORDER — AMLODIPINE BESYLATE 10 MG PO TABS
10.0000 mg | ORAL_TABLET | Freq: Every morning | ORAL | 1 refills | Status: DC
  Filled 2023-01-03: qty 90, 90d supply, fill #0
  Filled 2023-04-10: qty 90, 90d supply, fill #1

## 2023-01-03 NOTE — Therapy (Signed)
OUTPATIENT OCCUPATIONAL THERAPY TREATMENT NOTE  Patient Name: William [name] MRN: [id number] DOB:May 19, 1941, 82 y.o., male Today's Date: 01/03/2023  PCP & REFERRING PROVIDER:  [name], [name], DO    END OF SESSION:  OT End of Session - 01/03/23 1457     Visit Number 39    Number of Visits 47    Date for OT Re-Evaluation 01/25/23    Authorization Type [employer]    Progress Note Due on Visit 40    OT Start Time 1450    OT Stop Time 1530    OT Time Calculation (min) 40 min    Activity Tolerance Patient tolerated treatment well;No increased pain;Patient limited by fatigue    Behavior During Therapy [facility] for tasks assessed/performed;Restless;Impulsive             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   HTN (hypertension) 07/19/2015   Hyperlipidemia    Major neurocognitive disorder 01/09/2014   Mild, related to stroke history   Nocturia    Renal insufficiency 06/25/2013   S/P carotid endarterectomy    BILATERAL ICA--  PATENT PER DUPLEX  05-19-2012   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/26/2017   Right calf   Urinary frequency    Vitamin D deficiency    Past Surgical History:  Procedure Laterality Date   APPENDECTOMY  AS CHILD   CARDIOVASCULAR STRESS TEST  03-27-2012  DR CRENSHAW   LOW RISK LEXISCAN STUDY-- PROBABLE NORMAL PERFUSION AND SOFT TISSUE ATTENUATION/  NO ISCHEMIA/ EF 51%   CAROTID ENDARTERECTOMY Bilateral LEFT  11-12-2008  DR GREG HAYES   RIGHT ICA  2006  ([facility])   CHOLECYSTECTOMY N/A 02/23/2022   Procedure:  LAPAROSCOPIC CHOLECYSTECTOMY;  Surgeon: Quentin Ore, MD;  Location: [facility] OR;  Service: General;  Laterality: N/A;   CYSTOSCOPY W/ RETROGRADES Bilateral 06/22/2021   Procedure: CYSTOSCOPY WITH RETROGRADE PYELOGRAM;  Surgeon: Marcine Matar, MD;  Location: [facility];  Service: Urology;  Laterality: Bilateral;   CYSTOSCOPY WITH LITHOLAPAXY N/A 02/26/2013   Procedure: CYSTOSCOPY WITH LITHOLAPAXY;  Surgeon: Marcine Matar, MD;  Location: [facility];  Service: Urology;  Laterality: N/A;   ENDOSCOPIC RETROGRADE CHOLANGIOPANCREATOGRAPHY (ERCP) WITH PROPOFOL N/A 02/22/2022   Procedure: ENDOSCOPIC RETROGRADE CHOLANGIOPANCREATOGRAPHY (ERCP) WITH PROPOFOL;  Surgeon: Jeani Hawking, MD;  Location: [facility] ENDOSCOPY;  Service: Gastroenterology;  Laterality: N/A;   EYE SURGERY  Jan. 2016   cataract surgery both eyes   INGUINAL HERNIA REPAIR Right 11-08-2006   IR KYPHO EA ADDL LEVEL THORACIC OR LUMBAR  02/12/2021   IR RADIOLOGIST EVAL & MGMT  02/18/2021   MASS EXCISION N/A 03/03/2016   Procedure: EXCISION OF BACK  MASS;  Surgeon: Almond Lint, MD;  Location: [facility];  Service: General;  Laterality: N/A;   MOHS SURGERY Left 1/ 2016   Dr Margo Aye-- Basal cell   PROSTATE SURGERY     REMOVAL OF STONES  02/22/2022   Procedure: REMOVAL OF STONES;  Surgeon: Jeani Hawking,  MD;  Location: [facility] ENDOSCOPY;  Service: Gastroenterology;;   Dennison Mascot  02/22/2022   Procedure: Dennison Mascot;  Surgeon: Jeani Hawking, MD;  Location: [facility] ENDOSCOPY;  Service: Gastroenterology;;   TRANSURETHRAL RESECTION OF BLADDER TUMOR WITH MITOMYCIN-C N/A 06/22/2021   Procedure: TRANSURETHRAL RESECTION OF BLADDER TUMOR;  Surgeon: Marcine Matar, MD;  Location: [facility];  Service: Urology;  Laterality: N/A;   TRANSURETHRAL RESECTION OF PROSTATE N/A 02/26/2013   Procedure: TRANSURETHRAL RESECTION OF THE PROSTATE WITH GYRUS INSTRUMENTS;  Surgeon: Marcine Matar, MD;  Location:  [facility];  Service: Urology;  Laterality: N/A;   TRANSURETHRAL RESECTION OF PROSTATE N/A 06/22/2021   Procedure: TRANSURETHRAL RESECTION OF THE PROSTATE (TURP);  Surgeon: Marcine Matar, MD;  Location: [facility];  Service: Urology;  Laterality: N/A;   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   Insomnia    Prediabetes    Acute 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 (HCC)    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   Transaminitis 07/27/2021   UTI (urinary tract infection) 07/27/2021   CVA (cerebral vascular accident) (HCC) 07/27/2021   Fall 07/27/2021   Hyperglycemia 07/27/2021   Cholelithiasis 07/27/2021   Hypoxia 07/27/2021   Nausea and vomiting 07/27/2021   Acute metabolic encephalopathy 07/27/2021   Normocytic anemia 07/27/2021   Chronic back pain 07/27/2021   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   Closed fracture of first lumbar vertebra with routine healing 02/03/2021   Closed fracture of multiple ribs 11/18/2020   Anxiety 01/29/2020   Leg pain, bilateral 01/29/2020   Ingrown toenail 07/13/2019   Lumbar spondylosis 05/02/2018   Pain in left knee 03/09/2018   Osteoarthritis of left hip 01/16/2018   Trochanteric bursitis of left hip 01/16/2018   Preventative health care 09/26/2017   HTN (hypertension) 07/19/2015    Hyperlipidemia 07/19/2015   Great toe pain 02/11/2014   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   Obesity (BMI 30-39.9) 06/25/2013   Renal insufficiency 06/25/2013   Weakness of left arm 06/25/2013   Sebaceous cyst 03/03/2011   Sprain of lumbar region 07/31/2010   Rib pain, left 08/29/2009   Carotid artery stenosis, asymptomatic, bilateral 05/02/2009   Eczema, atopic 05/31/2008   Vitamin D deficiency 03/01/2008   BPH (benign prostatic hyperplasia) 08/06/2007   Fasting hyperglycemia 12/21/2006   History of right MCA infarct 2006    ONSET DATE: Acute exacerbation of chronic symptoms (most recent hospitalization 02/26/22 - 03/12/22, followed by SNF and home health)   REFERRING DIAG:  I63.9 (ICD-10-CM) - Cerebrovascular accident (CVA), unspecified mechanism (HCC)  W19.XXXD (ICD-10-CM) - Fall, subsequent encounter    THERAPY DIAG:  Muscle weakness (generalized)  Hemiplegia and hemiparesis following cerebral infarction affecting left non-dominant side (HCC)  Other lack of coordination  Acute pain of left shoulder  Attention and concentration deficit  Rationale for Evaluation and Treatment: Rehabilitation  PERTINENT HISTORY: PMHx includes L  basal ganglia and corona radiata infarct 07/27/21, L lower homonymous quadrantanopia; R MCA infarct in 2006 w/ L-sided hemiparesis, NCD, carotid artery disease (bilateral), HTN, HLD,CKD3, vascular dementia, and hx of bladder cancer. He has had falls in 2023, fracturing his clavicle in August, then he had stomach surgery in Sept 2023.  He needs assist at baseline for ADLs. When last documented in acute rehab after having his gall-bladder removed, (03/09/22) he required moderate to maximum assist x2 for most ADLs (more assist for ADLs the require standing), and they recommended rehab in skilled nursing setting. He apparently got Covid at some point and also attended a SNF. Once he D/C home, he had some episodes of combativeness at night  with  "sun-downing."  He is communicative and appropriate, though appears easily confused and distracted, not fully oriented to time and place (Ax2, states date is 12 and cannot say what city he lives in). His wife provides most details, and he is participative, though a poor historian at times. In a nut shell- he has been suffering from dementia and post-stroke effects for years, and his issues were recently exacerbated in Sept of last year after abdominal surgery. He has less use of Lt "stroke side" and new, ritualistic "ticks" in mouth and Rt hand/arm.  Wife states that they do have a caregiver come M, W, F for 4 hours in the middle of the day to mainly help him bathe and dress, which usually tires him out. He has been more combative towards the end of the day as well.   PRECAUTIONS: Fall and Other: Lt hemi, poor cognition, LATEX and adhesive allergies, etc.  ;WEIGHT BEARING RESTRICTIONS: No   SUBJECTIVE:   SUBJECTIVE STATEMENT: Pt's spouse asking how many visits we have left and if she could schedule more.  Pt accompanied by:  his wife   PAIN: no    FALLS: Has patient fallen in last 6 months? Yes. Number of falls 1 in last couple weeks and 2-3 near falls (backwards) when getting up from various height chairs   LIVING ENVIRONMENT: Lives with: lives with their spouse Lives in: House/apartment Has following equipment at home: manual w/c, 2 w/w, weighted utensils, shower bench (for tub), bed cane, hospital bed, raised toilet, and possibly more  PLOF: Needs assistance with ADLs, Needs assistance with homemaking, Needs assistance with gait, and Needs assistance with transfers  PATIENT GOALS: 11/30/22: to return to playing golf, increased attention (less distraction) to task to engage in ADLs and leisure pursuits, increase Lt arm usage   OBJECTIVE: (All objective assessments below are from initial evaluation on: 07/07/22 unless otherwise specified.)   HAND DOMINANCE: Right  ADLs: Overall  ADLs: Wife states decreased ability since hospitalization in Sept 23.  Transfers/ambulation related to ADLs: Min A transfer, CGA for mobility Eating: set up Grooming: Mod A- needs assist with oral care, shaving, but can comb his hair UB Dressing: Min A  LB Dressing: Dep  Toileting:  Mod  Bathing: Mod  Tub Shower transfers: Min A  Equipment: Emergency planning/management officer, Grab bars, and Feeding equipment  IADLs: completely dependent for years now, except some ability to use microwave which is now not possible after hospitalization   MOBILITY STATUS: Needs Assist: CGA - Min A, Hx of falls, Festination, difficulty with turns, and neglect on Lt side  POSTURE COMMENTS:  rounded shoulders, forward head, and weight shift left  ACTIVITY TOLERANCE: Activity tolerance: he became tired and somewhat agitated by the end of the OT evaluation today (after  PT and OT about 2.5hours with mostly sedate activities in seated positions)  FUNCTIONAL OUTCOME MEASURES: [facility] 17/30  showing problems with visuospatial/executive function, attention, fluency, and orientation. (at least 26/30 is needed to be considered "normal")  Trinna Balloon in ADLs: 1/6  (very dependent) 11/30/22: PPT#2: 21.79 sec  UPPER EXTREMITY ROM:  from initial eval  Active ROM Right eval Left eval Left 11/30/22 Lt 12/29/22  Shoulder scaption 100 85 85 95  Shoulder abduction   80 91  Shoulder adduction      Shoulder extension      Shoulder internal rotation Reaches small of back Reaches to hip Reaches to hip   Shoulder external rotation Reaches back of head  Reaches up to ear Reaches up to ear   Elbow flexion 150 130    Elbow extension (-10*)  (-35*)    Wrist flexion 35 20    Wrist extension 50 10    (Blank rows = not tested)  UPPER EXTREMITY MMT:    Tested grossly as "push" and "pull": UE push 4-/5, UE pull 4/5  HAND FUNCTION: 12/29/22: Grip Rt: 44#, Lt: 20#    Eval: Grip strength: Right: 30 lbs; Left: 20  lbs  COORDINATION: 12/29/22: BBT Rt: 14 blocks, Lt: 8 blocks   Eval: Finger Nose Finger test: mildly ataxic but fairly accurate  Box and Blocks:  Right 11blocks, Left 5blocks  MUSCLE TONE:  Rt- normal, Lt- decreased and worsened by inattention/disuse  COGNITION: Overall cognitive status: Impaired and Difficulty to assess due to: severity of deficits; Poor attention, fluency, orientation, and executive function. Recall is fairly good as well as abstraction.  Seems to have personality changes due to vascular dementia- like an OCD type volitional perseveration of tapping with Rt hand that he can somewhat control.  He did become angry and agitated at the end of the session performing car transfer with spouse: he did shout at her.  VISION: Subjective report: no new changes per pt/spouse, uses "cheaters"   VISION ASSESSMENT: Visual fields seem intact though he had latent responses on the left side showing likely inattention/and/neglect, tracking somewhat delayed horizontally and vertically  PERCEPTION: Impaired: Inattention/neglect: does not attend to left visual field and does not attend to left side of body, Body scheme: poor quality of recognition with shaving, etc., and Spatial orientation: decreased in print and in orientation, especially to Lt side  PRAXIS: Not tested specifically but no overt/stated issues using pen, wearing glasses ,etc.   OBSERVATIONS: Pt taps and flicks and does seem to volitionally perseverate on making a rhythm/noise with Rt hand. He states he is aware of it and chooses to do it.  He does also seem to have mild resting and intention tremor.    TODAY'S TREATMENT:     01/03/23 Engaged in forced use of LUE during modified box and blocks task turning box 90* into vertical position to facilitate increased shoulder flexion with reaching.  OT holding R hand to facilitate increased use of LUE.  OT incorporated cognitive aspect of following directions and selecting certain  color and/or number of blocks.  Once additional direction provided, pt losing focus with inability to finish initial task.  Pt also losing track of counting with inability to return once engaging in conversation, typically pt initiated.  Transitioned to forced use with placing golf balls into cup at various planes with LUE to facilitate increased reach, putting from seated position with cues to utilize BUE on stick used at simulated golf putter, and placing balls onto  tees (pegs) with LUE.     12/29/22: For functional activities, he does coordination box and blocks activity, showing improvement in Rt arm but no change in Lt. He does some grip test and training and sh AROM with does show Lt arm imrpvement. OT then re-edu on shoulder stretches that he has learned in the past in flexion, ER, abd, and has him perform.  Next, he does AA/ROM with wand and well as reach/grasp and coordinated activities at table top, including some large amplitude movements with review, finishing with fnl activity for catch/throw with light ~1lbs ball for VMS, GMS.  He leaves feeling some fatigue, no pain.    12/27/22 Engaged in structured activity with focus on sustained attention to task with navigating newspaper.  Pt initially flipping pages in paper at random both forward and backwards requiring mod cues to increase sequencing and question cues to recall what specific article he was searching for.  OT closing door as pt externally distracted by other therapist in treatment gym, noting mild improvements however still internally distracted.  Pt skimming article from R > L, therefore OT encouraged use of L hand placement and/or colorful bookmark on L to facilitate visual scanning to L side of article.  Pt also missing occasional words on far L, also benefiting from stimulus on L to increase visual scanning.  Pt requiring 12 mins to locate sports article requiring mod cues, pt asking one time for assistance, therefore OT removing other  sections of paper to decrease distraction.   Transitional movements: question cues provided for hand placement and counting for initiation with sit > stand.  Pt able to complete sit > stand at end of session with only cues and no physical assistance.  Pt with one instance of catching toe on equipment on L due to inattention to L side of body and other therapist on R taking pt's attention.  OT providing min, stabilizing assist at gait belt and then cues for sustained attention with mobility.    PATIENT EDUCATION: Education details: ongoing condition specific education Person educated: Patient and Spouse Education method: Explanation, Demonstration, Tactile cues, and Verbal cues, handout Education comprehension: Explanation, demonstration, v.c  HOME EXERCISE PROGRAM: Access Code: [email or web address] URL: [email or web address] Date: 12/02/2022 Prepared by: [facility] - [facility]  Exercises - Seated Shoulder Flexion Towel Slide at Table Top  - 2 x daily - 10-15 reps - Seated Dowel Chest Press  - 2 x daily - 10-15 reps - Seated Shoulder Flexion AAROM with Dowel  - 2 x daily - 10-15 reps - Dead Bug (IN SITTING)  - 2 x daily - 10 reps    GOALS: Goals reviewed with patient? Yes   SHORT TERM GOALS: (STG required if POC>30 days) Target Date: 12/28/22   Pt will demo/state understanding of HEP with focus on LUE ROM. Goal status: IN PROGRESS  2.  Pt will consistently sustain focus to a simple functional task  in a minimally distracting environment x 6 mins with no more than 1 re-direct(ie: eating, functional task in clinic) Goal status: IN PROGRESS    LONG TERM GOALS: Target Date: 01/25/23  Pt will improve functional ability by decreased impairment per Trinna Balloon assessment from 1/6 to 2/6 or better, for better quality of life. Goal status:  IN PROGRESS  2.  He will show better functional use of left arm by improved box and blocks Test score from 5  blocks to at least 10 blocks. Baseline: 8 blocks on  11/16/22 Goal status: IN PROGRESS  3. Pt will don a flannel button front shirt consistently with min A and min v.c Baseline: mod A with mod vc Goal status:  IN PROGRESS  4.  Pt will use his LUE as an active assist for ADLS/leisure tasks grossly 40% of the time with min v.c Baseline: uses 25% Goal status:  IN PROGRESS  5.  Pt will open the newspaper and locate sports and used car section with no more than min v.c                           Goal status:   IN PROGRESS  6.  Pt will be able to get up/down from toilet with supervision and no cues.  Goal status: IN PROGRESS  7.  Pt will demonstrate improved sustained attention to complete breakfast in <1 hour  Baseline: currently requiring ~2 hours to complete eating breakfast Goal status: IN PROGRESS  ASSESSMENT:  CLINICAL IMPRESSION: Pt demonstrating improved engagement with more motivating aspects of golf.  Still requiring mod cues and hand over hand to deter from using RUE during single handed tasks.  Pt still externally and internally distracted, however able to be redirected at times with mod cues.   OT Frequency/DURATION: 2x week x 8 weeks   PLANNED INTERVENTIONS: self care/ADL training, therapeutic exercise, therapeutic activity, neuromuscular re-education, manual therapy, balance training, functional mobility training, moist heat, cryotherapy, patient/family education, cognitive remediation/compensation, visual/perceptual remediation/compensation, psychosocial skills training, energy conservation, coping strategies training, DME and/or AE instructions, and Re-evaluation  RECOMMENDED OTHER SERVICES: He has already seen PT for balance and functional mobility, he would probably benefit from speech for cognitive issues as well, though OT will be working with cognition somewhat functionally now.  CONSULTED AND AGREED WITH PLAN OF CARE: Patient and family member/caregiver  PLAN FOR NEXT  SESSION:  Continue on as appropriate, try to draw attention to tasks and Lt side more     Intisar Claudio, OTR/L 2:59 PM 01/03/23

## 2023-01-04 ENCOUNTER — Ambulatory Visit: Payer: PPO

## 2023-01-04 DIAGNOSIS — R2689 Other abnormalities of gait and mobility: Secondary | ICD-10-CM

## 2023-01-04 DIAGNOSIS — M6281 Muscle weakness (generalized): Secondary | ICD-10-CM

## 2023-01-04 DIAGNOSIS — R262 Difficulty in walking, not elsewhere classified: Secondary | ICD-10-CM

## 2023-01-04 DIAGNOSIS — I69354 Hemiplegia and hemiparesis following cerebral infarction affecting left non-dominant side: Secondary | ICD-10-CM

## 2023-01-04 DIAGNOSIS — R296 Repeated falls: Secondary | ICD-10-CM

## 2023-01-04 NOTE — Therapy (Signed)
OUTPATIENT PHYSICAL THERAPY NEURO TREATMENT NOTE  Patient Name: William [name] MRN: [id number] DOB:12/26/1940, 82 y.o., male Today's Date: 01/04/2023  PCP: Donato Schultz  DO REFERRING PROVIDER: same   END OF SESSION:  PT End of Session - 01/04/23 1457     Visit Number 48    Number of Visits 54    Date for PT Re-Evaluation 01/22/23    Authorization Type HTA    Progress Note Due on Visit 50    PT Start Time 1445    PT Stop Time 1530    PT Time Calculation (min) 45 min    Equipment Utilized During Treatment Gait belt    Activity Tolerance Patient tolerated treatment well    Behavior During Therapy WFL for tasks assessed/performed                 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   HTN (hypertension) 07/19/2015   Hyperlipidemia    Major neurocognitive disorder 01/09/2014   Mild, related to stroke history   Nocturia    Renal insufficiency 06/25/2013   S/P carotid endarterectomy    BILATERAL ICA--  PATENT PER DUPLEX  05-19-2012   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/26/2017   Right calf   Urinary frequency    Vitamin D deficiency    Past Surgical History:  Procedure Laterality Date   APPENDECTOMY  AS CHILD   CARDIOVASCULAR STRESS TEST  03-27-2012  DR CRENSHAW   LOW RISK LEXISCAN STUDY-- PROBABLE NORMAL PERFUSION AND SOFT TISSUE ATTENUATION/  NO ISCHEMIA/ EF 51%   CAROTID ENDARTERECTOMY Bilateral LEFT  11-12-2008  DR GREG HAYES   RIGHT ICA  2006  ([facility])   CHOLECYSTECTOMY N/A 02/23/2022   Procedure: LAPAROSCOPIC CHOLECYSTECTOMY;   Surgeon: Quentin Ore, MD;  Location: [facility] OR;  Service: General;  Laterality: N/A;   CYSTOSCOPY W/ RETROGRADES Bilateral 06/22/2021   Procedure: CYSTOSCOPY WITH RETROGRADE PYELOGRAM;  Surgeon: Marcine Matar, MD;  Location: [facility] ;  Service: Urology;  Laterality: Bilateral;   CYSTOSCOPY WITH LITHOLAPAXY N/A 02/26/2013   Procedure: CYSTOSCOPY WITH LITHOLAPAXY;  Surgeon: Marcine Matar, MD;  Location: [facility];  Service: Urology;  Laterality: N/A;   ENDOSCOPIC RETROGRADE CHOLANGIOPANCREATOGRAPHY (ERCP) WITH PROPOFOL N/A 02/22/2022   Procedure: ENDOSCOPIC RETROGRADE CHOLANGIOPANCREATOGRAPHY (ERCP) WITH PROPOFOL;  Surgeon: Jeani Hawking, MD;  Location: [facility]OSCOPY;  Service: Gastroenterology;  Laterality: N/A;   EYE SURGERY  Jan. 2016   cataract surgery both eyes   INGUINAL HERNIA REPAIR Right 11-08-2006   IR KYPHO EA ADDL LEVEL THORACIC OR LUMBAR  02/12/2021   IR RADIOLOGIST EVAL & MGMT  02/18/2021   MASS EXCISION N/A 03/03/2016   Procedure: EXCISION OF BACK  MASS;  Surgeon: Almond Lint, MD;  Location: [facility];  Service: General;  Laterality: N/A;   MOHS SURGERY Left 1/ 2016   Dr Margo Aye-- Basal cell   PROSTATE SURGERY     REMOVAL OF STONES  02/22/2022   Procedure: REMOVAL OF STONES;  Surgeon: Jeani Hawking, MD;  Location: [facility] ENDOSCOPY;  Service: Gastroenterology;;   Dennison Mascot  02/22/2022   Procedure: Dennison Mascot;  Surgeon: Jeani Hawking, MD;  Location: [facility] ENDOSCOPY;  Service: Gastroenterology;;   TRANSURETHRAL RESECTION OF BLADDER TUMOR WITH MITOMYCIN-C N/A 06/22/2021   Procedure: TRANSURETHRAL RESECTION OF BLADDER TUMOR;  Surgeon: Marcine Matar, MD;  Location: [facility];  Service: Urology;  Laterality: N/A;   TRANSURETHRAL RESECTION OF PROSTATE N/A 02/26/2013   Procedure: TRANSURETHRAL RESECTION OF THE PROSTATE WITH GYRUS INSTRUMENTS;  Surgeon: Marcine Matar, MD;  Location: [facility];   Service: Urology;  Laterality: N/A;   TRANSURETHRAL RESECTION OF PROSTATE N/A 06/22/2021   Procedure: TRANSURETHRAL RESECTION OF THE PROSTATE (TURP);  Surgeon: Marcine Matar, MD;  Location: [facility];  Service: Urology;  Laterality: N/A;   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   Insomnia    Prediabetes    Acute 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 (HCC)    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   Transaminitis 07/27/2021   UTI (urinary tract infection) 07/27/2021   CVA (cerebral vascular accident) (HCC) 07/27/2021   Fall 07/27/2021   Hyperglycemia 07/27/2021   Cholelithiasis 07/27/2021   Hypoxia 07/27/2021   Nausea and vomiting 07/27/2021   Acute metabolic encephalopathy 07/27/2021   Normocytic anemia 07/27/2021   Chronic back pain 07/27/2021   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   Closed fracture of first lumbar vertebra with routine healing 02/03/2021   Closed fracture of multiple ribs 11/18/2020   Anxiety 01/29/2020   Leg pain, bilateral 01/29/2020   Ingrown toenail 07/13/2019   Lumbar spondylosis 05/02/2018   Pain in left knee 03/09/2018   Osteoarthritis of left hip 01/16/2018   Trochanteric bursitis of left hip 01/16/2018   Preventative health care 09/26/2017   HTN (hypertension) 07/19/2015   Hyperlipidemia 07/19/2015    Great toe pain 02/11/2014   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   Obesity (BMI 30-39.9) 06/25/2013   Renal insufficiency 06/25/2013   Weakness of left arm 06/25/2013   Sebaceous cyst 03/03/2011   Sprain of lumbar region 07/31/2010   Rib pain, left 08/29/2009   Carotid artery stenosis, asymptomatic, bilateral 05/02/2009   Eczema, atopic 05/31/2008   Vitamin D deficiency 03/01/2008   BPH (benign prostatic hyperplasia) 08/06/2007   Fasting hyperglycemia 12/21/2006   History of right MCA infarct 2006    ONSET DATE: 06/29/22 REFERRING DIAG:  Diagnosis  I63.9 (ICD-10-CM) - Cerebrovascular accident (CVA), unspecified mechanism (HCC)  W19.XXXD (ICD-10-CM) - Fall, subsequent encounter    THERAPY DIAG:  Muscle weakness (generalized)  Hemiplegia and hemiparesis following cerebral infarction affecting left non-dominant side (HCC)  Other abnormalities of gait and mobility  Repeated falls  Difficulty in walking, not elsewhere classified  Rationale for Evaluation and Treatment: Rehabilitation  SUBJECTIVE:  SUBJECTIVE STATEMENT:  Experienced a fall this AM in the kitchen and having difficulty with recovering mobility and balance thereafter  PERTINENT HISTORY:  ZAYDYN HAVEY is a 82 year old man with dementia, CKD, HTN, CAD, HLD and history of CVA  (2006, 2023), Parkinson's  PAIN:  Are you having pain? No  PRECAUTIONS: Fall  WEIGHT BEARING RESTRICTIONS: No  FALLS: Has patient fallen in last 6 months? No  LIVING ENVIRONMENT: Lives with: lives with their family and lives with their spouse Lives in: House/apartment Stairs:  1 brick high step Has following equipment at home: Environmental consultant - 2 wheeled, Wheelchair (manual), Shower bench, bed side commode, Grab bars, and hospital bed, sliding pad  for car  PLOF: Independent with basic ADLs and Independent with household mobility without device  PATIENT GOALS: Walk around the block with his wife. Be able to use the dining room chair rather than have to use the W/C. Increased I with bed mobility and simple tasks at home like make some coffee or brush his teeth, Step over tub to shower, has bench, but he can't really use it. Up and down the one step to get to back deck. 11/23/22: Patient's wife [name] updated his goals today: Walk around store (gets distracted), swing a golf club again (thinking of driving range), get up on his own, walk without leaning forward, more recognition of the left side (from CVA), not need gait belt, OT-- be able to get dressed more on his own, navigate the newspaper.  OBJECTIVE:   TODAY'S TREATMENT: 01/04/23 Activity Comments  Transfer training Cues for trunk flexion over BOS and carryover of momentum  NU-step level 5 x 8 min With assist for full ROM and assist for increased speed  Gait training -left platform RW CGA-min A for level surfaces and maintaining control and U-turns -lines on floor for step length/height -gait w/ Giv-Mor sling LUE               TODAY'S TREATMENT: 12/30/2022 Activity Comments  NuStep, Level 4, 4 extremities x 6 minutes Cues for long stride push, SPM up to 80 (increases from 40's with short strides)  Standing mini squats at parallel bars, 10 reps   Forward step ups to 6" step, each leg leading 8-10 reps   Postural strengthening in standing:   -forward gentle squat to upright posture  -Reach across to touch target to L, then return to midline with cues to reset posture Tactile and verbal cues  Seated postural stretch, forward lean Lateral lean   Seated posture strength: Push into ball, then PWR! Up Tactile and verbal cues    PATIENT EDUCATION: Education details: strategies for visual, verbal, tactile cues for improved posture Person educated: Patient and Spouse Education  method: Explanation, Demonstration, Tactile cues, and Verbal cues Education comprehension: verbalized understanding, returned demonstration, verbal cues required, tactile cues required, and needs further education    (Measures in this section from initial evaluation unless otherwise noted) DIAGNOSTIC FINDINGS:  MRI 2021 with degenerative changes in lumbar spine, L3-4 R subarticular stenosis COGNITION: Overall cognitive status: History of cognitive impairments - at baseline SENSATION: Not tested COORDINATION: Moderately impaired BLE MUSCLE LENGTH: Hamstrings: severely restricted B Thomas test: Severely restricted B. POSTURE: rounded shoulders, forward head, increased thoracic kyphosis, posterior pelvic tilt, and flexed trunk  LOWER EXTREMITY ROM:   BLE extremely stiff throughout, limited hip ROM in all planes, B knees limited in extension. LOWER EXTREMITY MMT:  3+/5 throughout. Unable to determine accuracy of MMT due to  cognition. Functionally demosntrates poor coordinated activation and poor muscular endurance. BED MOBILITY: Min A for Supine<> Sit. Patient was using a ladder in his hospital bed and could move MI. TRANSFERS: Min A, mod VC, tends to lean back.  11/02/22 independent with use of hands on arm chair with a couple of tries CURB: Min A-Has one step out to his deck. STAIRS: N/A  GAIT: Gait pattern: step to pattern, decreased arm swing- Right, decreased arm swing- Left, decreased step length- Right, decreased step length- Left, shuffling, festinating, trunk flexed, narrow BOS, poor foot clearance- Right, and poor foot clearance- Left Distance walked: 43' Assistive device utilized: Environmental consultant - 2 wheeled Level of assistance: Occasional min A Comments: mod VC to stay close to walker, take longer steps. FUNCTIONAL TESTS:  5 times sit to stand: 38.23  09/29/22  could not performs 5XSTS, 11/02/22 = 47 seconds with cues to use arms Timed up and go (TUG): 47.91  with RW, 07/21/22 TUG 29   seconds without device, 09/29/22 25 seconds, 11/02/22 TUG 20 seconds                                                                            PATIENT EDUCATION: Education details: Progress towards goals, POC Person educated: Patient and Spouse Education method: Programmer, multimedia, Facilities manager, and Verbal cues  Education comprehension: verbalized understanding, returned demonstration, verbal cues required, and needs further education  HOME EXERCISE PROGRAM: TBD  GOALS: Goals reviewed with patient? Yes  SHORT TERM GOALS: Target date: 12/22/22  Perform HEP for PWR! Moves with cues from family. Baseline: Pt's wife, [name], notes walking and hitting beach ball as main HEP. Goal status: GOAL MET, 12/20/2022   2. Decrease 5 x STS to < 35 sec to demonstrate improved functional strength  Baseline: 47 seconds on 5/21 per note; 34.5 sec UE support 12/20/2022  Goal status: GOAL MET, 12/20/2022   3. Perform sit <> stand transfers from average chair height with Supervision.  Baseline: CGA  12/20/2022  Goal status:  IN PROGRESS, 12/20/2022  LONG TERM GOALS: Target date: 01/22/23  Patient will perform HEP progression with caregiver assist. Baseline: has minimal HEP Goal status: REVISED  2.  Decrease 5 x STS to < 25 sec to demonstrate improved functional strength Baseline: 47 seconds on 5/21 per note Goal status: REVISED  3. Improve quality of gait to be able to ambulate x 100 feet with supervision. Baseline: CGA with cues  Goal status: REVISED  4.  Patient will perform his bed mobility with MI including rolling, sit<>supine, and scooting in bed. Baseline: on hard surface mat he is independent but on soft bed at home he struggles, wife reports only needs a hand to get up now 11/02/22 Goal status: REVISED  5.  Patient will demonstrate floor<>stand with CGA and verbal cues for patient's wife to be able to assist. Baseline:  Has to call 9-1-1 Goal status: NEW GOAL  ASSESSMENT:  CLINICAL  IMPRESSION:  Focus on gait training today to improve safety and introduced RW with left platform RW but this did little to assuage his postural control with tendency for excessive trunk flexion.  Use of lines on floor for visual cue to promote step height and stride which  worked quite well demonstrating adequate foot clearance and stride with visual cues and able to carryover to unmarked floor for 30-40 ft increments.  Demonstrates tendency for festination and loss of postural control to the left with increased gait speed.  Utilized giv-mor sling for LUE to promote reciprocal arm swing with some improvement in upright posture but negated by tendency for festination and LOB.  OBJECTIVE IMPAIRMENTS: Abnormal gait, decreased activity tolerance, decreased balance, decreased cognition, decreased coordination, decreased endurance, decreased mobility, difficulty walking, decreased ROM, decreased strength, decreased safety awareness, impaired flexibility, impaired UE functional use, improper body mechanics, and postural dysfunction.   PLAN:  PT FREQUENCY: 2x/week  PT DURATION: 12 weeks  PLANNED INTERVENTIONS: Therapeutic exercises, Therapeutic activity, Neuromuscular re-education, Balance training, Gait training, Patient/Family education, Self Care, Joint mobilization, Stair training, Moist heat, and Manual therapy  PLAN FOR NEXT SESSION: Try U-step if in clinc  [employer], PT 01/04/2023, 2:57 PM  [facility] [address] [place], [place], [address] Phone: [phone]   Fax:  [phone]

## 2023-01-05 ENCOUNTER — Ambulatory Visit: Payer: PPO | Admitting: Occupational Therapy

## 2023-01-05 DIAGNOSIS — M25512 Pain in left shoulder: Secondary | ICD-10-CM

## 2023-01-05 DIAGNOSIS — R278 Other lack of coordination: Secondary | ICD-10-CM

## 2023-01-05 DIAGNOSIS — M6281 Muscle weakness (generalized): Secondary | ICD-10-CM

## 2023-01-05 DIAGNOSIS — R2689 Other abnormalities of gait and mobility: Secondary | ICD-10-CM

## 2023-01-05 DIAGNOSIS — I69354 Hemiplegia and hemiparesis following cerebral infarction affecting left non-dominant side: Secondary | ICD-10-CM

## 2023-01-05 DIAGNOSIS — R41840 Attention and concentration deficit: Secondary | ICD-10-CM

## 2023-01-05 NOTE — Therapy (Signed)
OUTPATIENT OCCUPATIONAL THERAPY TREATMENT NOTE  Patient Name: William [name] MRN: [id number] DOB:Mar 11, 1941, 82 y.o., male Today's Date: 01/05/2023  PCP & REFERRING PROVIDER:  [name], [name], DO    END OF SESSION:  OT End of Session - 01/05/23 1505     Visit Number 40    Number of Visits 47    Date for OT Re-Evaluation 01/25/23    Authorization Type [employer]    Progress Note Due on Visit 40    OT Start Time 1449    OT Stop Time 1530    OT Time Calculation (min) 41 min    Activity Tolerance Patient tolerated treatment well;No increased pain;Patient limited by fatigue    Behavior During Therapy [facility] for tasks assessed/performed;Restless;Impulsive              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   HTN (hypertension) 07/19/2015   Hyperlipidemia    Major neurocognitive disorder 01/09/2014   Mild, related to stroke history   Nocturia    Renal insufficiency 06/25/2013   S/P carotid endarterectomy    BILATERAL ICA--  PATENT PER DUPLEX  05-19-2012   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/26/2017   Right calf   Urinary frequency    Vitamin D deficiency    Past Surgical History:  Procedure Laterality Date   APPENDECTOMY  AS CHILD   CARDIOVASCULAR STRESS TEST  03-27-2012  DR CRENSHAW   LOW RISK LEXISCAN STUDY-- PROBABLE NORMAL PERFUSION AND SOFT TISSUE ATTENUATION/  NO ISCHEMIA/ EF 51%   CAROTID ENDARTERECTOMY Bilateral LEFT  11-12-2008  DR GREG HAYES   RIGHT ICA  2006  ([facility])   CHOLECYSTECTOMY N/A 02/23/2022   Procedure:  LAPAROSCOPIC CHOLECYSTECTOMY;  Surgeon: Quentin Ore, MD;  Location: [facility] OR;  Service: General;  Laterality: N/A;   CYSTOSCOPY W/ RETROGRADES Bilateral 06/22/2021   Procedure: CYSTOSCOPY WITH RETROGRADE PYELOGRAM;  Surgeon: Marcine Matar, MD;  Location: [facility] [place];  Service: Urology;  Laterality: Bilateral;   CYSTOSCOPY WITH LITHOLAPAXY N/A 02/26/2013   Procedure: CYSTOSCOPY WITH LITHOLAPAXY;  Surgeon: Marcine Matar, MD;  Location: [facility];  Service: Urology;  Laterality: N/A;   ENDOSCOPIC RETROGRADE CHOLANGIOPANCREATOGRAPHY (ERCP) WITH PROPOFOL N/A 02/22/2022   Procedure: ENDOSCOPIC RETROGRADE CHOLANGIOPANCREATOGRAPHY (ERCP) WITH PROPOFOL;  Surgeon: Jeani Hawking, MD;  Location: [facility] ENDOSCOPY;  Service: Gastroenterology;  Laterality: N/A;   EYE SURGERY  Jan. 2016   cataract surgery both eyes   INGUINAL HERNIA REPAIR Right 11-08-2006   IR KYPHO EA ADDL LEVEL THORACIC OR LUMBAR  02/12/2021   IR RADIOLOGIST EVAL & MGMT  02/18/2021   MASS EXCISION N/A 03/03/2016   Procedure: EXCISION OF BACK  MASS;  Surgeon: Almond Lint, MD;  Location: [facility];  Service: General;  Laterality: N/A;   MOHS SURGERY Left 1/ 2016   Dr Margo Aye-- Basal cell   PROSTATE SURGERY     REMOVAL OF STONES  02/22/2022   Procedure: REMOVAL OF STONES;  Surgeon: Elnoria Howard,  Luisa Hart, MD;  Location: [facility] ENDOSCOPY;  Service: Gastroenterology;;   Dennison Mascot  02/22/2022   Procedure: Dennison Mascot;  Surgeon: Jeani Hawking, MD;  Location: [facility] ENDOSCOPY;  Service: Gastroenterology;;   TRANSURETHRAL RESECTION OF BLADDER TUMOR WITH MITOMYCIN-C N/A 06/22/2021   Procedure: TRANSURETHRAL RESECTION OF BLADDER TUMOR;  Surgeon: Marcine Matar, MD;  Location: [facility];  Service: Urology;  Laterality: N/A;   TRANSURETHRAL RESECTION OF PROSTATE N/A 02/26/2013   Procedure: TRANSURETHRAL RESECTION OF THE PROSTATE WITH GYRUS INSTRUMENTS;  Surgeon: Marcine Matar, MD;  Location:  [facility];  Service: Urology;  Laterality: N/A;   TRANSURETHRAL RESECTION OF PROSTATE N/A 06/22/2021   Procedure: TRANSURETHRAL RESECTION OF THE PROSTATE (TURP);  Surgeon: Marcine Matar, MD;  Location: [facility];  Service: Urology;  Laterality: N/A;   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   Insomnia    Prediabetes    Acute 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 (HCC)    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   Transaminitis 07/27/2021   UTI (urinary tract infection) 07/27/2021   CVA (cerebral vascular accident) (HCC) 07/27/2021   Fall 07/27/2021   Hyperglycemia 07/27/2021   Cholelithiasis 07/27/2021   Hypoxia 07/27/2021   Nausea and vomiting 07/27/2021   Acute metabolic encephalopathy 07/27/2021   Normocytic anemia 07/27/2021   Chronic back pain 07/27/2021   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   Closed fracture of first lumbar vertebra with routine healing 02/03/2021   Closed fracture of multiple ribs 11/18/2020   Anxiety 01/29/2020   Leg pain, bilateral 01/29/2020   Ingrown toenail 07/13/2019   Lumbar spondylosis 05/02/2018   Pain in left knee 03/09/2018   Osteoarthritis of left hip 01/16/2018   Trochanteric bursitis of left hip 01/16/2018   Preventative health care 09/26/2017   HTN (hypertension) 07/19/2015    Hyperlipidemia 07/19/2015   Great toe pain 02/11/2014   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   Obesity (BMI 30-39.9) 06/25/2013   Renal insufficiency 06/25/2013   Weakness of left arm 06/25/2013   Sebaceous cyst 03/03/2011   Sprain of lumbar region 07/31/2010   Rib pain, left 08/29/2009   Carotid artery stenosis, asymptomatic, bilateral 05/02/2009   Eczema, atopic 05/31/2008   Vitamin D deficiency 03/01/2008   BPH (benign prostatic hyperplasia) 08/06/2007   Fasting hyperglycemia 12/21/2006   History of right MCA infarct 2006    ONSET DATE: Acute exacerbation of chronic symptoms (most recent hospitalization 02/26/22 - 03/12/22, followed by SNF and home health)   REFERRING DIAG:  I63.9 (ICD-10-CM) - Cerebrovascular accident (CVA), unspecified mechanism (HCC)  W19.XXXD (ICD-10-CM) - Fall, subsequent encounter    THERAPY DIAG:  Muscle weakness (generalized)  Hemiplegia and hemiparesis following cerebral infarction affecting left non-dominant side (HCC)  Other abnormalities of gait and mobility  Other lack of coordination  Acute pain of left shoulder  Attention and concentration deficit  Rationale for Evaluation and  Treatment: Rehabilitation  PERTINENT HISTORY: PMHx includes L basal ganglia and corona radiata infarct 07/27/21, L lower homonymous quadrantanopia; R MCA infarct in 2006 w/ L-sided hemiparesis, NCD, carotid artery disease (bilateral), HTN, HLD,CKD3, vascular dementia, and hx of bladder cancer. He has had falls in 2023, fracturing his clavicle in August, then he had stomach surgery in Sept 2023.  He needs assist at baseline for ADLs. When last documented in acute rehab after having his gall-bladder removed, (03/09/22) he required moderate to maximum assist x2 for most ADLs (more assist for ADLs the require standing), and they recommended rehab in skilled nursing setting. He apparently got Covid at some point and also attended a SNF. Once he D/C home, he had some  episodes of combativeness at night  with "sun-downing."  He is communicative and appropriate, though appears easily confused and distracted, not fully oriented to time and place (Ax2, states date is 19 and cannot say what city he lives in). His wife provides most details, and he is participative, though a poor historian at times. In a nut shell- he has been suffering from dementia and post-stroke effects for years, and his issues were recently exacerbated in Sept of last year after abdominal surgery. He has less use of Lt "stroke side" and new, ritualistic "ticks" in mouth and Rt hand/arm.  Wife states that they do have a caregiver come M, W, F for 4 hours in the middle of the day to mainly help him bathe and dress, which usually tires him out. He has been more combative towards the end of the day as well.   PRECAUTIONS: Fall and Other: Lt hemi, poor cognition, LATEX and adhesive allergies, etc.  ;WEIGHT BEARING RESTRICTIONS: No   SUBJECTIVE:   SUBJECTIVE STATEMENT: Pt's son asking if it is beneficial to have family present in therapy sessions.  Pt accompanied by:  his wife   PAIN: no    FALLS: Has patient fallen in last 6 months? Yes. Number of falls 1 in last couple weeks and 2-3 near falls (backwards) when getting up from various height chairs   LIVING ENVIRONMENT: Lives with: lives with their spouse Lives in: House/apartment Has following equipment at home: manual w/c, 2 w/w, weighted utensils, shower bench (for tub), bed cane, hospital bed, raised toilet, and possibly more  PLOF: Needs assistance with ADLs, Needs assistance with homemaking, Needs assistance with gait, and Needs assistance with transfers  PATIENT GOALS: 11/30/22: to return to playing golf, increased attention (less distraction) to task to engage in ADLs and leisure pursuits, increase Lt arm usage   OBJECTIVE: (All objective assessments below are from initial evaluation on: 07/07/22 unless otherwise specified.)    HAND DOMINANCE: Right  ADLs: Overall ADLs: Wife states decreased ability since hospitalization in Sept 23.  Transfers/ambulation related to ADLs: Min A transfer, CGA for mobility Eating: set up Grooming: Mod A- needs assist with oral care, shaving, but can comb his hair UB Dressing: Min A  LB Dressing: Dep  Toileting:  Mod  Bathing: Mod  Tub Shower transfers: Min A  Equipment: Emergency planning/management officer, Grab bars, and Feeding equipment  IADLs: completely dependent for years now, except some ability to use microwave which is now not possible after hospitalization   MOBILITY STATUS: Needs Assist: CGA - Min A, Hx of falls, Festination, difficulty with turns, and neglect on Lt side  POSTURE COMMENTS:  rounded shoulders, forward head, and weight shift left  ACTIVITY TOLERANCE: Activity tolerance: he became tired and somewhat agitated by the  end of the OT evaluation today (after PT and OT about 2.5hours with mostly sedate activities in seated positions)  FUNCTIONAL OUTCOME MEASURES: [facility] 17/30  showing problems with visuospatial/executive function, attention, fluency, and orientation. (at least 26/30 is needed to be considered "normal")  Trinna Balloon in ADLs: 1/6  (very dependent) 11/30/22: PPT#2: 21.79 sec  UPPER EXTREMITY ROM:  from initial eval  Active ROM Right eval Left eval Left 11/30/22 Lt 12/29/22  Shoulder scaption 100 85 85 95  Shoulder abduction   80 91  Shoulder adduction      Shoulder extension      Shoulder internal rotation Reaches small of back Reaches to hip Reaches to hip   Shoulder external rotation Reaches back of head  Reaches up to ear Reaches up to ear   Elbow flexion 150 130    Elbow extension (-10*)  (-35*)    Wrist flexion 35 20    Wrist extension 50 10    (Blank rows = not tested)  UPPER EXTREMITY MMT:    Tested grossly as "push" and "pull": UE push 4-/5, UE pull 4/5  HAND FUNCTION: 12/29/22: Grip Rt: 44#, Lt: 20#    Eval: Grip strength:  Right: 30 lbs; Left: 20 lbs  COORDINATION: 12/29/22: BBT Rt: 14 blocks, Lt: 8 blocks   Eval: Finger Nose Finger test: mildly ataxic but fairly accurate  Box and Blocks:  Right 11blocks, Left 5blocks  MUSCLE TONE:  Rt- normal, Lt- decreased and worsened by inattention/disuse  COGNITION: Overall cognitive status: Impaired and Difficulty to assess due to: severity of deficits; Poor attention, fluency, orientation, and executive function. Recall is fairly good as well as abstraction.  Seems to have personality changes due to vascular dementia- like an OCD type volitional perseveration of tapping with Rt hand that he can somewhat control.  He did become angry and agitated at the end of the session performing car transfer with spouse: he did shout at her.  VISION: Subjective report: no new changes per pt/spouse, uses "cheaters"   VISION ASSESSMENT: Visual fields seem intact though he had latent responses on the left side showing likely inattention/and/neglect, tracking somewhat delayed horizontally and vertically  PERCEPTION: Impaired: Inattention/neglect: does not attend to left visual field and does not attend to left side of body, Body scheme: poor quality of recognition with shaving, etc., and Spatial orientation: decreased in print and in orientation, especially to Lt side  PRAXIS: Not tested specifically but no overt/stated issues using pen, wearing glasses ,etc.   OBSERVATIONS: Pt taps and flicks and does seem to volitionally perseverate on making a rhythm/noise with Rt hand. He states he is aware of it and chooses to do it.  He does also seem to have mild resting and intention tremor.    TODAY'S TREATMENT:     01/05/23 Sci Fit for 6 mins with min tactile cues at L elbow to maintain LUE in contact with handle bar.  Completed 2 mins forward, 2 mins backward, and 1 min forward, 1 min backward with cues for sustained attention to task.  Pt required max multimodal cues and cues for sequencing  when standing for Sci Fit seat after completion.   WB thorugh BUE on counter top with mod multimodal cues for sequencing of modified cat/cow positioning to facilitate weight shifting and following commands.  Progressed to large amplitude reaching with RUE while WB through LUE for NMR as well as stabilization with reaching as spouse reports pt with increased tendency to fall forward.  Pt  demonstrating good weight shifting and reaching with RUE, min cues for amplitude and to maintain LUE on counter top for support.  Transitioned to reaching with LUE for forced use of LUE to lower shelf and reaching outside BOS to L with tactile cues to refrain from using RUE. Transitional movements: OT providing mod question cues for setup with hand placement and scooting towards edge of chair and mat table prior to sit > stand.  Pt continues to require cues for anterior weight shifting with sit > stand. Cues for larger steps with ambulation to/from treatment room with pt demonstrating good carryover and good upright posture during exit from therapy session. Large amplitude: engaged in forward forearm extension with finger flicks, OT providing demonstration and cues for technique and large amplitude.  Transitioned to modified LSVT big seated exercise with focus on reaching forward and towards floor.  OT providing box on floor with pillow to allow for improved reach towards floor with increased amplitude without needing to reach fully to floor.      01/03/23 Engaged in forced use of LUE during modified box and blocks task turning box 90* into vertical position to facilitate increased shoulder flexion with reaching.  OT holding R hand to facilitate increased use of LUE.  OT incorporated cognitive aspect of following directions and selecting certain color and/or number of blocks.  Once additional direction provided, pt losing focus with inability to finish initial task.  Pt also losing track of counting with inability to return  once engaging in conversation, typically pt initiated.  Transitioned to forced use with placing golf balls into cup at various planes with LUE to facilitate increased reach, putting from seated position with cues to utilize BUE on stick used at simulated golf putter, and placing balls onto tees (pegs) with LUE.     12/29/22: For functional activities, he does coordination box and blocks activity, showing improvement in Rt arm but no change in Lt. He does some grip test and training and sh AROM with does show Lt arm imrpvement. OT then re-edu on shoulder stretches that he has learned in the past in flexion, ER, abd, and has him perform.  Next, he does AA/ROM with wand and well as reach/grasp and coordinated activities at table top, including some large amplitude movements with review, finishing with fnl activity for catch/throw with light ~1lbs ball for VMS, GMS.  He leaves feeling some fatigue, no pain.       PATIENT EDUCATION: Education details: ongoing condition specific education Person educated: Patient and Spouse Education method: Explanation, Demonstration, Tactile cues, and Verbal cues, handout Education comprehension: Explanation, demonstration, v.c  HOME EXERCISE PROGRAM: Access Code: [email or web address] URL: [email or web address] Date: 12/02/2022 Prepared by: [facility] Outpatient  Re[facility] - [facility]  Exercises - Seated Shoulder Flexion Towel Slide at Table Top  - 2 x daily - 10-15 reps - Seated Dowel Chest Press  - 2 x daily - 10-15 reps - Seated Shoulder Flexion AAROM with Dowel  - 2 x daily - 10-15 reps - Dead Bug (IN SITTING)  - 2 x daily - 10 reps    GOALS: Goals reviewed with patient? Yes   SHORT TERM GOALS: (STG required if POC>30 days) Target Date: 12/28/22   Pt will demo/state understanding of HEP with focus on LUE ROM. Goal status: IN PROGRESS  2.  Pt will consistently sustain focus to a simple functional task  in a minimally distracting environment x  6 mins with no more than 1 re-direct(ie: eating,  functional task in clinic) Goal status: IN PROGRESS    LONG TERM GOALS: Target Date: 01/25/23  Pt will improve functional ability by decreased impairment per Trinna Balloon assessment from 1/6 to 2/6 or better, for better quality of life. Goal status:  IN PROGRESS  2.  He will show better functional use of left arm by improved box and blocks Test score from 5 blocks to at least 10 blocks. Baseline: 8 blocks on 11/16/22 Goal status: IN PROGRESS  3. Pt will don a flannel button front shirt consistently with min A and min v.c Baseline: mod A with mod vc Goal status:  IN PROGRESS  4.  Pt will use his LUE as an active assist for ADLS/leisure tasks grossly 40% of the time with min v.c Baseline: uses 25% Goal status:  IN PROGRESS  5.  Pt will open the newspaper and locate sports and used car section with no more than min v.c                           Goal status:   IN PROGRESS  6.  Pt will be able to get up/down from toilet with supervision and no cues.  Goal status: IN PROGRESS  7.  Pt will demonstrate improved sustained attention to complete breakfast in <1 hour  Baseline: currently requiring ~2 hours to complete eating breakfast Goal status: IN PROGRESS  ASSESSMENT:  CLINICAL IMPRESSION: Pt demonstrating improved engagement with large amplitude movements with more functional task of removing cups from cabinet as well as attention to LUE during SciFit activity.  Pt continues to require mod cues for setup of positioning prior to transitional movements including attention to foot placement, as pt with narrow base of support.  Pt demonstrating improved carryover of activities with mobility.  Pt still requiring mod cues and hand over hand to deter from using RUE during single handed tasks.  Pt still externally and internally distracted with inconsistent ability to redirect.   OT Frequency/DURATION: 2x week x 8 weeks   PLANNED  INTERVENTIONS: self care/ADL training, therapeutic exercise, therapeutic activity, neuromuscular re-education, manual therapy, balance training, functional mobility training, moist heat, cryotherapy, patient/family education, cognitive remediation/compensation, visual/perceptual remediation/compensation, psychosocial skills training, energy conservation, coping strategies training, DME and/or AE instructions, and Re-evaluation  RECOMMENDED OTHER SERVICES: He has already seen PT for balance and functional mobility, he would probably benefit from speech for cognitive issues as well, though OT will be working with cognition somewhat functionally now.  CONSULTED AND AGREED WITH PLAN OF CARE: Patient and family member/caregiver  PLAN FOR NEXT SESSION:  Continue on as appropriate, try to draw attention to tasks and Lt side more     Milina Pagett, OTR/L 3:06 PM 01/05/23

## 2023-01-06 ENCOUNTER — Encounter: Payer: Self-pay | Admitting: Physical Therapy

## 2023-01-06 ENCOUNTER — Ambulatory Visit: Payer: PPO | Admitting: Physical Therapy

## 2023-01-06 DIAGNOSIS — R2689 Other abnormalities of gait and mobility: Secondary | ICD-10-CM | POA: Diagnosis not present

## 2023-01-06 DIAGNOSIS — M6281 Muscle weakness (generalized): Secondary | ICD-10-CM

## 2023-01-06 NOTE — Therapy (Signed)
OUTPATIENT PHYSICAL THERAPY NEURO TREATMENT NOTE  Patient Name: William [name] MRN: [id number] DOB:[birth year], 82 y.o., male Today's Date: 01/07/2023  PCP: Donato Schultz  DO REFERRING PROVIDER: same   END OF SESSION:  PT End of Session - 01/06/23 1448     Visit Number 49    Number of Visits 54    Date for PT Re-Evaluation 01/22/23    Authorization Type HTA    Progress Note Due on Visit 50    PT Start Time 1449    PT Stop Time 1531    PT Time Calculation (min) 42 min    Equipment Utilized During Treatment Gait belt    Activity Tolerance Patient tolerated treatment well    Behavior During Therapy WFL for tasks assessed/performed                 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   HTN (hypertension) 07/19/2015   Hyperlipidemia    Major neurocognitive disorder 01/09/2014   Mild, related to stroke history   Nocturia    Renal insufficiency 06/25/2013   S/P carotid endarterectomy    BILATERAL ICA--  PATENT PER DUPLEX  05-19-2012   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   Right calf   Urinary frequency    Vitamin D deficiency    Past Surgical History:  Procedure Laterality Date   APPENDECTOMY  AS CHILD   CARDIOVASCULAR STRESS TEST  03-27-2012  DR CRENSHAW   LOW RISK LEXISCAN STUDY-- PROBABLE NORMAL PERFUSION AND SOFT TISSUE ATTENUATION/  NO ISCHEMIA/ EF 51%   CAROTID ENDARTERECTOMY Bilateral LEFT  11-12-2008  DR GREG HAYES   RIGHT ICA  2006  ([facility])   CHOLECYSTECTOMY N/A 02/23/2022   Procedure: LAPAROSCOPIC CHOLECYSTECTOMY;   Surgeon: Quentin Ore, MD;  Location: [facility] OR;  Service: General;  Laterality: N/A;   CYSTOSCOPY W/ RETROGRADES Bilateral 06/22/2021   Procedure: CYSTOSCOPY WITH RETROGRADE PYELOGRAM;  Surgeon: Marcine Matar, MD;  Location[facility];  Service: Urology;  Laterality: Bilateral;   CYSTOSCOPY WITH LITHOLAPAXY N/A 02/26/2013   Procedure: CYSTOSCOPY WITH LITHOLAPAXY;  Surgeon: Marcine Matar, MD;  Location: [facility];  Service: Urology;  Laterality: N/A;   ENDOSCOPIC RETROGRADE CHOLANGIOPANCREATOGRAPHY (ERCP) WITH PROPOFOL N/A 02/22/2022   Procedure: ENDOSCOPIC RETROGRADE CHOLANGIOPANCREATOGRAPHY (ERCP) WITH PROPOFOL;  Surgeon: Jeani Hawking, MD;  Location: [facility] ENDOSCOPY;  Service: Gastroenterology;  Laterality: N/A;   EYE SURGERY  Jan. 2016   cataract surgery both eyes   INGUINAL HERNIA REPAIR Right 11-08-2006   IR KYPHO EA ADDL LEVEL THORACIC OR LUMBAR  02/12/2021   IR RADIOLOGIST EVAL & MGMT  02/18/2021   MASS EXCISION N/A 03/03/2016   Procedure: EXCISION OF BACK  MASS;  Surgeon: Almond Lint, MD;  Location: [facility];  Service: General;  Laterality: N/A;   MOHS SURGERY Left 1/ 2016   Dr Margo Aye-- Basal cell   PROSTATE SURGERY     REMOVAL OF STONES  02/22/2022   Procedure: REMOVAL OF STONES;  Surgeon: Jeani Hawking, MD;  Location: [facility] ENDOSCOPY;  Service: Gastroenterology;;   Dennison Mascot  02/22/2022   Procedure: Dennison Mascot;  Surgeon: Jeani Hawking, MD;  Location: [facility] ENDOSCOPY;  Service: Gastroenterology;;   TRANSURETHRAL RESECTION OF BLADDER TUMOR WITH MITOMYCIN-C N/A 06/22/2021   Procedure: TRANSURETHRAL RESECTION OF BLADDER TUMOR;  Surgeon: Marcine Matar, MD;  Location: [facility];  Service: Urology;  Laterality: N/A;   TRANSURETHRAL RESECTION OF PROSTATE N/A 02/26/2013   Procedure: TRANSURETHRAL RESECTION OF THE PROSTATE WITH GYRUS INSTRUMENTS;  Surgeon: Marcine Matar, MD;  Location: [facility];   Service: Urology;  Laterality: N/A;   TRANSURETHRAL RESECTION OF PROSTATE N/A 06/22/2021   Procedure: TRANSURETHRAL RESECTION OF THE PROSTATE (TURP);  Surgeon: Marcine Matar, MD;  Location: [facility];  Service: Urology;  Laterality: N/A;   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   Insomnia    Prediabetes    Acute 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 (HCC)    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   Transaminitis 07/27/2021   UTI (urinary tract infection) 07/27/2021   CVA (cerebral vascular accident) (HCC) 07/27/2021   Fall 07/27/2021   Hyperglycemia 07/27/2021   Cholelithiasis 07/27/2021   Hypoxia 07/27/2021   Nausea and vomiting 07/27/2021   Acute metabolic encephalopathy 07/27/2021   Normocytic anemia 07/27/2021   Chronic back pain 07/27/2021   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   Closed fracture of first lumbar vertebra with routine healing 02/03/2021   Closed fracture of multiple ribs 11/18/2020   Anxiety 01/29/2020   Leg pain, bilateral 01/29/2020   Ingrown toenail 07/13/2019   Lumbar spondylosis 05/02/2018   Pain in left knee 03/09/2018   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   Trochanteric bursitis of left hip 01/16/2018   Preventative health care 09/26/2017   HTN (hypertension) 07/19/2015   Hyperlipidemia 07/19/2015    Great toe pain 02/11/2014   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   Obesity (BMI 30-39.9) 06/25/2013   Renal insufficiency 06/25/2013   Weakness of left arm 06/25/2013   Sebaceous cyst 03/03/2011   Sprain of lumbar region 07/31/2010   Rib pain, left 08/29/2009   Carotid artery stenosis, asymptomatic, bilateral 05/02/2009   Eczema, atopic 05/31/2008   Vitamin D deficiency 03/01/2008   BPH (benign prostatic hyperplasia) 08/06/2007   Fasting hyperglycemia 12/21/2006   History of right MCA infarct 2006    ONSET DATE: 06/29/22 REFERRING DIAG:  Diagnosis  I63.9 (ICD-10-CM) - Cerebrovascular accident (CVA), unspecified mechanism (HCC)  W19.XXXD (ICD-10-CM) - Fall, subsequent encounter    THERAPY DIAG:  Muscle weakness (generalized)  Other abnormalities of gait and mobility  Rationale for Evaluation and Treatment: Rehabilitation  SUBJECTIVE:  SUBJECTIVE STATEMENT:  My balance and equilibrium is off a bit.  Son present today with patient at therapy.  PERTINENT HISTORY:  CORDNEY BARSTOW is a 82 year old man with dementia, CKD, HTN, CAD, HLD and history of CVA  (2006, 2023), Parkinson's  PAIN:  Are you having pain? No  PRECAUTIONS: Fall  WEIGHT BEARING RESTRICTIONS: No  FALLS: Has patient fallen in last 6 months? No  LIVING ENVIRONMENT: Lives with: lives with their family and lives with their spouse Lives in: House/apartment Stairs:  1 brick high step Has following equipment at home: Environmental consultant - 2 wheeled, Wheelchair (manual), Shower bench, bed side commode, Grab bars, and hospital bed, sliding pad for car  PLOF: Independent with basic ADLs and Independent with household mobility without device  PATIENT GOALS: Walk around the block with his wife. Be able to use the dining room chair  rather than have to use the W/C. Increased I with bed mobility and simple tasks at home like make some coffee or brush his teeth, Step over tub to shower, has bench, but he can't really use it. Up and down the one step to get to back deck. 11/23/22: Patient's wife [name] updated his goals today: Walk around store (gets distracted), swing a golf club again (thinking of driving range), get up on his own, walk without leaning forward, more recognition of the left side (from CVA), not need gait belt, OT-- be able to get dressed more on his own, navigate the newspaper.  OBJECTIVE:    TODAY'S TREATMENT: 01/06/2023 Activity Comments  NuStep, Level 4, 4 extremities x 8 minutes Assistance provided to increase push through LLE for optimal stretch and flexibility  Standing activities in parallel bars: -Side step and weightshift over hurdle 10 reps -Forward step and weightshift over hurdle 10 reps -Forward step tap to 6" block x 10 reps each leg -Side step tap to 6" block x 10 each leg -Foot propped on 6" block with head turns/nods for balance Use of mirror for visual feedback; wears 3# weights at ankles  Constant cues for high, wide, BIG step to clear obstacles on L  Min guard/min assist provided throughout and cues to reset posture/balance at times  Transfers from w/c and from Nustep seat:  sit<>Stand Min guard and cues for hand placement  Gait training (no device today)-50 ft x 4 reps, with turns; cues to reset balance and posture after turn PT holds R hand and provides cues for BIG KICK Left foot for increased L foot clearance and step length           PATIENT EDUCATION: Education details: Cues for visual, verbal, tactile cues for improved posture, slowed pace of gait Person educated: Patient and Spouse Education method: Explanation, Demonstration, Tactile cues, and Verbal cues Education comprehension: verbalized understanding, returned demonstration, verbal cues required, tactile cues required,  and needs further education    (Measures in this section from initial evaluation unless otherwise noted) DIAGNOSTIC FINDINGS:  MRI 2021 with degenerative changes in lumbar spine, L3-4 R subarticular stenosis COGNITION: Overall cognitive status: History of cognitive impairments - at baseline SENSATION: Not tested COORDINATION: Moderately impaired BLE MUSCLE LENGTH: Hamstrings: severely restricted B Thomas test: Severely restricted B. POSTURE: rounded shoulders, forward head, increased thoracic kyphosis, posterior pelvic tilt, and flexed trunk  LOWER EXTREMITY ROM:   BLE extremely stiff throughout, limited hip ROM in all planes, B knees limited in extension. LOWER EXTREMITY MMT:  3+/5 throughout. Unable to determine accuracy of MMT due to cognition.  Functionally demosntrates poor coordinated activation and poor muscular endurance. BED MOBILITY: Min A for Supine<> Sit. Patient was using a ladder in his hospital bed and could move MI. TRANSFERS: Min A, mod VC, tends to lean back.  11/02/22 independent with use of hands on arm chair with a couple of tries CURB: Min A-Has one step out to his deck. STAIRS: N/A  GAIT: Gait pattern: step to pattern, decreased arm swing- Right, decreased arm swing- Left, decreased step length- Right, decreased step length- Left, shuffling, festinating, trunk flexed, narrow BOS, poor foot clearance- Right, and poor foot clearance- Left Distance walked: 78' Assistive device utilized: Environmental consultant - 2 wheeled Level of assistance: Occasional min A Comments: mod VC to stay close to walker, take longer steps. FUNCTIONAL TESTS:  5 times sit to stand: 38.23  09/29/22  could not performs 5XSTS, 11/02/22 = 47 seconds with cues to use arms Timed up and go (TUG): 47.91  with RW, 07/21/22 TUG 29  seconds without device, 09/29/22 25 seconds, 11/02/22 TUG 20 seconds                                                                            PATIENT EDUCATION: Education details:  Progress towards goals, POC Person educated: Patient and Spouse Education method: Programmer, multimedia, Facilities manager, and Verbal cues  Education comprehension: verbalized understanding, returned demonstration, verbal cues required, and needs further education  HOME EXERCISE PROGRAM: TBD  GOALS: Goals reviewed with patient? Yes  SHORT TERM GOALS: Target date: 12/22/22  Perform HEP for PWR! Moves with cues from family. Baseline: Pt's wife, [name], notes walking and hitting beach ball as main HEP. Goal status: GOAL MET, 12/20/2022   2. Decrease 5 x STS to < 35 sec to demonstrate improved functional strength  Baseline: 47 seconds on 5/21 per note; 34.5 sec UE support 12/20/2022  Goal status: GOAL MET, 12/20/2022   3. Perform sit <> stand transfers from average chair height with Supervision.  Baseline: CGA  12/20/2022  Goal status:  IN PROGRESS, 12/20/2022  LONG TERM GOALS: Target date: 01/22/23  Patient will perform HEP progression with caregiver assist. Baseline: has minimal HEP Goal status: REVISED  2.  Decrease 5 x STS to < 25 sec to demonstrate improved functional strength Baseline: 47 seconds on 5/21 per note Goal status: REVISED  3. Improve quality of gait to be able to ambulate x 100 feet with supervision. Baseline: CGA with cues  Goal status: REVISED  4.  Patient will perform his bed mobility with MI including rolling, sit<>supine, and scooting in bed. Baseline: on hard surface mat he is independent but on soft bed at home he struggles, wife reports only needs a hand to get up now 11/02/22 Goal status: REVISED  5.  Patient will demonstrate floor<>stand with CGA and verbal cues for patient's wife to be able to assist. Baseline:  Has to call 9-1-1 Goal status: NEW GOAL  ASSESSMENT:  CLINICAL IMPRESSION:  Pt presents today with son at session.  He has had several falls this week, one fall yesterday upon leaving therapy session.  Son reports bruising on L arm, but pt is able to move  without pain and has had no additional issues per son report.  Utilized Nustep for aerobic warm-up, with therapist assist to increase amplitude of movement through LLE.  Worked on standing balance and LLE foot clearance, step length, and SLS in parallel bars.  Used mirror for visual cues, but pt is distracted throughout session; he needs cues to redirect to task and to reset posture/balance to avoid LOB.  He responds well to cues for BIG motion on LLE, but legs fatigue easily throughout session.  He will continue to benefit from skilled PT to address balance, strength and safety with gait towards LTGs.    OBJECTIVE IMPAIRMENTS: Abnormal gait, decreased activity tolerance, decreased balance, decreased cognition, decreased coordination, decreased endurance, decreased mobility, difficulty walking, decreased ROM, decreased strength, decreased safety awareness, impaired flexibility, impaired UE functional use, improper body mechanics, and postural dysfunction.   PLAN:  PT FREQUENCY: 2x/week  PT DURATION: 12 weeks  PLANNED INTERVENTIONS: Therapeutic exercises, Therapeutic activity, Neuromuscular re-education, Balance training, Gait training, Patient/Family education, Self Care, Joint mobilization, Stair training, Moist heat, and Manual therapy  PLAN FOR NEXT SESSION: Next visit will be 10th visit progress note; need to begin to discuss POC.  Try U-step if in clinc (*could reach out to [place] to try to borrow [address] U-step) (Would [employer] potentially be an option for continued community fitness?-Aysia Lowder can talk about this when she sees him next)  Gean Maidens., PT 01/07/2023, 9:03 AM  [facility] [address] [place], [place], [address] Phone: [phone]   Fax:  [phone]

## 2023-01-10 ENCOUNTER — Ambulatory Visit: Payer: PPO

## 2023-01-10 DIAGNOSIS — I69354 Hemiplegia and hemiparesis following cerebral infarction affecting left non-dominant side: Secondary | ICD-10-CM

## 2023-01-10 DIAGNOSIS — R2689 Other abnormalities of gait and mobility: Secondary | ICD-10-CM | POA: Diagnosis not present

## 2023-01-10 DIAGNOSIS — M6281 Muscle weakness (generalized): Secondary | ICD-10-CM

## 2023-01-10 DIAGNOSIS — R296 Repeated falls: Secondary | ICD-10-CM

## 2023-01-10 DIAGNOSIS — R262 Difficulty in walking, not elsewhere classified: Secondary | ICD-10-CM

## 2023-01-10 NOTE — Therapy (Signed)
OUTPATIENT PHYSICAL THERAPY NEURO TREATMENT NOTE and Progress Report  Patient Name: William [name] MRN: [id number] DOB:Feb 19, 1941, 82 y.o., male Today's Date: 01/10/2023  PCP: Donato Schultz  DO REFERRING PROVIDER: same  Progress Note Reporting Period 11/30/22 to 01/10/23  See note below for Objective Data and Assessment of Progress/Goals.     END OF SESSION:  PT End of Session - 01/10/23 1446     Visit Number 50    Number of Visits 54    Date for PT Re-Evaluation 01/22/23    Authorization Type HTA    Progress Note Due on Visit 50    PT Start Time 1445    PT Stop Time 1530    PT Time Calculation (min) 45 min    Equipment Utilized During Treatment Gait belt    Activity Tolerance Patient tolerated treatment well    Behavior During Therapy WFL for tasks assessed/performed                 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   HTN (hypertension) 07/19/2015   Hyperlipidemia    Major neurocognitive disorder 01/09/2014   Mild, related to stroke history   Nocturia    Renal insufficiency 06/25/2013   S/P carotid endarterectomy    BILATERAL ICA--  PATENT PER DUPLEX  05-19-2012   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   Right calf   Urinary frequency    Vitamin D deficiency    Past Surgical History:  Procedure Laterality Date   APPENDECTOMY  AS CHILD   CARDIOVASCULAR STRESS TEST  03-27-2012  DR CRENSHAW   LOW RISK LEXISCAN STUDY-- PROBABLE NORMAL PERFUSION AND SOFT TISSUE ATTENUATION/  NO ISCHEMIA/ EF 51%   CAROTID ENDARTERECTOMY  Bilateral LEFT  11-12-2008  DR GREG HAYES   RIGHT ICA  2006  ([facility])   CHOLECYSTECTOMY N/A 02/23/2022   Procedure: LAPAROSCOPIC CHOLECYSTECTOMY;  Surgeon: Quentin Ore, MD;  Location: [facility] OR;  Service: General;  Laterality: N/A;   CYSTOSCOPY W/ RETROGRADES Bilateral 06/22/2021   Procedure: CYSTOSCOPY WITH RETROGRADE PYELOGRAM;  Surgeon: Marcine Matar, MD;  Location: [facility];  Service: Urology;  Laterality: Bilateral;   CYSTOSCOPY WITH LITHOLAPAXY N/A 02/26/2013   Procedure: CYSTOSCOPY WITH LITHOLAPAXY;  Surgeon: Marcine Matar, MD;  Location: [facility];  Service: Urology;  Laterality: N/A;   ENDOSCOPIC RETROGRADE CHOLANGIOPANCREATOGRAPHY (ERCP) WITH PROPOFOL N/A 02/22/2022   Procedure: ENDOSCOPIC RETROGRADE CHOLANGIOPANCREATOGRAPHY (ERCP) WITH PROPOFOL;  Surgeon: Jeani Hawking, MD;  Location: [facility] ENDOSCOPY;  Service: Gastroenterology;  Laterality: N/A;   EYE SURGERY  Jan. 2016   cataract surgery both eyes   INGUINAL HERNIA REPAIR Right 11-08-2006   IR KYPHO EA ADDL LEVEL THORACIC OR LUMBAR  02/12/2021   IR RADIOLOGIST EVAL & MGMT  02/18/2021   MASS EXCISION N/A 03/03/2016   Procedure: EXCISION OF BACK  MASS;  Surgeon: Almond Lint, MD;  Location: [facility];  Service: General;  Laterality: N/A;   MOHS SURGERY Left 1/ 2016  Dr Margo Aye-- Basal cell   PROSTATE SURGERY     REMOVAL OF STONES  02/22/2022   Procedure: REMOVAL OF STONES;  Surgeon: Jeani Hawking, MD;  Location: [facility] ENDOSCOPY;  Service: Gastroenterology;;   Dennison Mascot  02/22/2022   Procedure: Dennison Mascot;  Surgeon: Jeani Hawking, MD;  Location: [facility] ENDOSCOPY;  Service: Gastroenterology;;   TRANSURETHRAL RESECTION OF BLADDER TUMOR WITH MITOMYCIN-C N/A 06/22/2021   Procedure: TRANSURETHRAL RESECTION OF BLADDER TUMOR;  Surgeon: Marcine Matar, MD;  Location: [facility];  Service: Urology;  Laterality: N/A;   TRANSURETHRAL RESECTION OF PROSTATE N/A 02/26/2013    Procedure: TRANSURETHRAL RESECTION OF THE PROSTATE WITH GYRUS INSTRUMENTS;  Surgeon: Marcine Matar, MD;  Location: [facility]  Service: Urology;  Laterality: N/A;   TRANSURETHRAL RESECTION OF PROSTATE N/A 06/22/2021   Procedure: TRANSURETHRAL RESECTION OF THE PROSTATE (TURP);  Surgeon: Marcine Matar, MD;  Location: [facility];  Service: Urology;  Laterality: N/A;   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   Insomnia    Prediabetes    Acute 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 (HCC)    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   Transaminitis 07/27/2021   UTI (urinary tract infection) 07/27/2021   CVA (cerebral vascular accident) (HCC) 07/27/2021   Fall 07/27/2021   Hyperglycemia 07/27/2021   Cholelithiasis 07/27/2021   Hypoxia 07/27/2021   Nausea and vomiting 07/27/2021   Acute metabolic encephalopathy 07/27/2021   Normocytic anemia 07/27/2021   Chronic back pain 07/27/2021   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   Closed fracture of first lumbar vertebra with routine healing 02/03/2021   Closed fracture of multiple ribs 11/18/2020   Anxiety 01/29/2020   Leg pain, bilateral 01/29/2020   Ingrown toenail 07/13/2019   Lumbar spondylosis 05/02/2018   Pain in left knee 03/09/2018   Osteoarthritis of left hip 01/16/2018    Trochanteric bursitis of left hip 01/16/2018   Preventative health care 09/26/2017   HTN (hypertension) 07/19/2015   Hyperlipidemia 07/19/2015   Great toe pain 02/11/2014   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   Obesity (BMI 30-39.9) 06/25/2013   Renal insufficiency 06/25/2013   Weakness of left arm 06/25/2013   Sebaceous cyst 03/03/2011   Sprain of lumbar region 07/31/2010   Rib pain, left 08/29/2009   Carotid artery stenosis, asymptomatic, bilateral 05/02/2009   Eczema, atopic 05/31/2008   Vitamin D deficiency 03/01/2008   BPH (benign prostatic hyperplasia) 08/06/2007   Fasting hyperglycemia 12/21/2006   History of right MCA infarct 2006    ONSET DATE: 06/29/22 REFERRING DIAG:  Diagnosis  I63.9 (ICD-10-CM) - Cerebrovascular accident (CVA), unspecified mechanism (HCC)  W19.XXXD (ICD-10-CM) - Fall, subsequent encounter    THERAPY DIAG:  Muscle weakness (generalized)  Other abnormalities of gait and mobility  Hemiplegia and hemiparesis following cerebral infarction affecting left non-dominant side (HCC)  Repeated falls  Difficulty in walking, not elsewhere classified  Rationale for Evaluation and Treatment: Rehabilitation  SUBJECTIVE:                                                                                                                                                                                         SUBJECTIVE STATEMENT:  Left arm is a little sore from the last fall  PERTINENT HISTORY:  KAZIMIERZ RODI is a 82 year old man with dementia, CKD, HTN, CAD, HLD and history of CVA  (2006, 2023), Parkinson's  PAIN:  Are you having pain? No  PRECAUTIONS: Fall  WEIGHT BEARING RESTRICTIONS: No  FALLS: Has patient fallen in last 6 months? No  LIVING ENVIRONMENT: Lives with: lives with their family and lives with their spouse Lives in: House/apartment Stairs:  1 brick high step Has following equipment at home: Environmental consultant - 2 wheeled, Wheelchair  (manual), Shower bench, bed side commode, Grab bars, and hospital bed, sliding pad for car  PLOF: Independent with basic ADLs and Independent with household mobility without device  PATIENT GOALS: Walk around the block with his wife. Be able to use the dining room chair rather than have to use the W/C. Increased I with bed mobility and simple tasks at home like make some coffee or brush his teeth, Step over tub to shower, has bench, but he can't really use it. Up and down the one step to get to back deck. 11/23/22: Patient's wife [name] updated his goals today: Walk around store (gets distracted), swing a golf club again (thinking of driving range), get up on his own, walk without leaning forward, more recognition of the left side (from CVA), not need gait belt, OT-- be able to get dressed more on his own, navigate the newspaper.  OBJECTIVE:   TODAY'S TREATMENT: 01/10/23 Activity Comments  NU-step level 5 x 6 min    Gait training -Training in use of U-step walker to improve safety with gait and prevent instances of festinating.  Good return demonstration performed w/ SBA-CGA on level surfaces x 200 ft -U-step with lazer line for step height/length with verbal cues for carryover -gait on pavement and up/down ramp w/ CGA for stability  5xSTS Trial 1) 67 sec--difficulty sequencing Trial 2) 36 sec--tactile/verbal cues for speed/sequence  Bed mobility -sit to supine: mod I -rolling: mod I -supine to sit: mod I  Transfer training -sit to stand w/ SBA-CGA for cues in sequence           PATIENT EDUCATION: Education details: Cues for visual, verbal, tactile cues for improved posture, slowed pace of gait Person educated: Patient and Spouse Education method: Explanation, Demonstration, Tactile cues, and Verbal cues Education comprehension:  verbalized understanding, returned demonstration, verbal cues required, tactile cues required, and needs further education    (Measures in this section from  initial evaluation unless otherwise noted) DIAGNOSTIC FINDINGS:  MRI 2021 with degenerative changes in lumbar spine, L3-4 R subarticular stenosis COGNITION: Overall cognitive status: History of cognitive impairments - at baseline SENSATION: Not tested COORDINATION: Moderately impaired BLE MUSCLE LENGTH: Hamstrings: severely restricted B Thomas test: Severely restricted B. POSTURE: rounded shoulders, forward head, increased thoracic kyphosis, posterior pelvic tilt, and flexed trunk  LOWER EXTREMITY ROM:   BLE extremely stiff throughout, limited hip ROM in all planes, B knees limited in extension. LOWER EXTREMITY MMT:  3+/5 throughout. Unable to determine accuracy of MMT due to cognition. Functionally demosntrates poor coordinated activation and poor muscular endurance. BED MOBILITY: Min A for Supine<> Sit. Patient was using a ladder in his hospital bed and could move MI. TRANSFERS: Min A, mod VC, tends to lean back.  11/02/22 independent with use of hands on arm chair with a couple of tries CURB: Min A-Has one step out to his deck. STAIRS: N/A  GAIT: Gait pattern: step to pattern, decreased arm swing- Right, decreased arm swing- Left, decreased step length- Right, decreased step length- Left, shuffling, festinating, trunk flexed, narrow BOS, poor foot clearance- Right, and poor foot clearance- Left Distance walked: 61' Assistive device utilized: Environmental consultant - 2 wheeled Level of assistance: Occasional min A Comments: mod VC to stay close to walker, take longer steps. FUNCTIONAL TESTS:  5 times sit to stand: 38.23  09/29/22  could not performs 5XSTS, 11/02/22 = 47 seconds with cues to use arms Timed up and go (TUG): 47.91  with RW, 07/21/22 TUG 29  seconds without device, 09/29/22 25 seconds, 11/02/22 TUG 20 seconds                                                                            PATIENT EDUCATION: Education details: Progress towards goals, POC Person educated: Patient and  Spouse Education method: Programmer, multimedia, Facilities manager, and Verbal cues  Education comprehension: verbalized understanding, returned demonstration, verbal cues required, and needs further education  HOME EXERCISE PROGRAM: TBD  GOALS: Goals reviewed with patient? Yes  SHORT TERM GOALS: Target date: 12/22/22  Perform HEP for PWR! Moves with cues from family. Baseline: Pt's wife, [name], notes walking and hitting beach ball as main HEP. Goal status: GOAL MET, 12/20/2022   2. Decrease 5 x STS to < 35 sec to demonstrate improved functional strength  Baseline: 47 seconds on 5/21 per note; 34.5 sec UE support 12/20/2022  Goal status: GOAL MET, 12/20/2022   3. Perform sit <> stand transfers from average chair height with Supervision.  Baseline: CGA  12/20/2022; (01/10/23) CGA  Goal status: NOT MET  LONG TERM GOALS: Target date: 01/22/23  Patient will perform HEP progression with caregiver assist. Baseline: has minimal HEP Goal status: REVISED  2.  Decrease 5 x STS to < 25 sec to demonstrate improved functional strength Baseline: 47 seconds on 5/21 per note; (01/10/23) 36 sec w/ cues for speed/sequence Goal status: REVISED  3. Improve quality of gait to be able to ambulate x 100 feet with supervision. Baseline: CGA with cues  Goal status: REVISED  4.  Patient will perform his bed mobility with MI including rolling, sit<>supine, and scooting in bed. Baseline: on hard surface mat he is independent but on soft bed at home he struggles, wife reports only needs a hand to get up now 11/02/22; (01/10/23) modified indep.  Goal status: MET  5.  Patient will demonstrate floor<>stand with CGA and verbal cues for patient's wife to be able to assist. Baseline:  Has to call 9-1-1 Goal status: IN PROGRESS  ASSESSMENT:  CLINICAL IMPRESSION:  Initiated with use of NU-step for NMR to improve large amplitude as well as fast, alternating movements. 5xSTS reveals increased time of 36 sec and heavy cues for sequence  and maintenance of motion.  Gait training with use of U-step today with improved ability to maintain postural stability by use of brake features to help reduce instances of festination to allow for stopping and readjusting posture to more upright but requires CGA throughout due to high risk for falls and poor sequencing and awareness.  Use of lazer feature to project line on ground to good effect with improved step length and height achieved with minimal shuffling using this feature.  No carryover to outside surfaces due to sunlight and return of shuffling pattern.  Demo modified indep bed mobility performance and reports use of bed assist rail at home to help this as well.  Continued need for tactile cues for successful transfers.   OBJECTIVE IMPAIRMENTS: Abnormal gait, decreased activity tolerance, decreased balance, decreased cognition, decreased coordination, decreased endurance, decreased mobility, difficulty walking, decreased ROM, decreased strength, decreased safety awareness, impaired flexibility, impaired UE functional use, improper body mechanics, and postural dysfunction.   PLAN:  PT FREQUENCY: 2x/week  PT DURATION: 12 weeks  PLANNED INTERVENTIONS: Therapeutic exercises, Therapeutic activity, Neuromuscular re-education, Balance training, Gait training, Patient/Family education, Self Care, Joint mobilization, Stair training, Moist heat, and Manual therapy  PLAN FOR NEXT SESSION:   Try U-step if in clinc. (Would ACT potentially be an option for continued community fitness?-Amy can talk about this when she sees him next)  Dion Body, PT 01/10/2023, 2:47 PM  [facility] [address], [address] [place], [place], [address] Phone: [phone]   Fax:  [phone]

## 2023-01-11 ENCOUNTER — Ambulatory Visit: Payer: PPO | Admitting: Occupational Therapy

## 2023-01-11 DIAGNOSIS — I69354 Hemiplegia and hemiparesis following cerebral infarction affecting left non-dominant side: Secondary | ICD-10-CM

## 2023-01-11 DIAGNOSIS — M25512 Pain in left shoulder: Secondary | ICD-10-CM

## 2023-01-11 DIAGNOSIS — R2689 Other abnormalities of gait and mobility: Secondary | ICD-10-CM

## 2023-01-11 DIAGNOSIS — M6281 Muscle weakness (generalized): Secondary | ICD-10-CM

## 2023-01-11 DIAGNOSIS — R278 Other lack of coordination: Secondary | ICD-10-CM

## 2023-01-11 DIAGNOSIS — R41840 Attention and concentration deficit: Secondary | ICD-10-CM

## 2023-01-11 NOTE — Therapy (Signed)
OUTPATIENT OCCUPATIONAL THERAPY TREATMENT NOTE  Patient Name: William [name] MRN: [id number] DOB:[birth year], 82 y.o., male Today's Date: 01/11/2023  PCP & REFERRING PROVIDER:  [name], [name], DO    END OF SESSION:  OT End of Session - 01/11/23 1551     Visit Number 41    Number of Visits 47    Date for OT Re-Evaluation 01/25/23    Authorization Type [employer]    Progress Note Due on Visit 40    OT Start Time 1450    OT Stop Time 1535    OT Time Calculation (min) 45 min    Activity Tolerance Patient tolerated treatment well;No increased pain;Patient limited by fatigue    Behavior During Therapy [facility] for tasks assessed/performed;Restless;Impulsive               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   HTN (hypertension) 07/19/2015   Hyperlipidemia    Major neurocognitive disorder 01/09/2014   Mild, related to stroke history   Nocturia    Renal insufficiency 06/25/2013   S/P carotid endarterectomy    BILATERAL ICA--  PATENT PER DUPLEX  05-19-2012   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   Right calf   Urinary frequency    Vitamin D deficiency    Past Surgical History:  Procedure Laterality Date   APPENDECTOMY  AS CHILD   CARDIOVASCULAR STRESS TEST  03-27-2012  DR CRENSHAW   LOW RISK LEXISCAN STUDY-- PROBABLE NORMAL PERFUSION AND SOFT TISSUE ATTENUATION/  NO ISCHEMIA/ EF 51%   CAROTID ENDARTERECTOMY Bilateral LEFT  11-12-2008  DR GREG HAYES   RIGHT ICA  2006  ([facility])   CHOLECYSTECTOMY N/A 02/23/2022   Procedure:  LAPAROSCOPIC CHOLECYSTECTOMY;  Surgeon: Quentin Ore, MD;  Location: [facility] OR;  Service: General;  Laterality: N/A;   CYSTOSCOPY W/ RETROGRADES Bilateral 06/22/2021   Procedure: CYSTOSCOPY WITH RETROGRADE PYELOGRAM;  Surgeon: Marcine Matar, MD;  Location: [facility];  Service: Urology;  Laterality: Bilateral;   CYSTOSCOPY WITH LITHOLAPAXY N/A 02/26/2013   Procedure: CYSTOSCOPY WITH LITHOLAPAXY;  Surgeon: Marcine Matar, MD;  Location: [facility];  Service: Urology;  Laterality: N/A;   ENDOSCOPIC RETROGRADE CHOLANGIOPANCREATOGRAPHY (ERCP) WITH PROPOFOL N/A 02/22/2022   Procedure: ENDOSCOPIC RETROGRADE CHOLANGIOPANCREATOGRAPHY (ERCP) WITH PROPOFOL;  Surgeon: Jeani Hawking, MD;  Location: [facility] ENDOSCOPY;  Service: Gastroenterology;  Laterality: N/A;   EYE SURGERY  Jan. 2016   cataract surgery both eyes   INGUINAL HERNIA REPAIR Right 11-08-2006   IR KYPHO EA ADDL LEVEL THORACIC OR LUMBAR  02/12/2021   IR RADIOLOGIST EVAL & MGMT  02/18/2021   MASS EXCISION N/A 03/03/2016   Procedure: EXCISION OF BACK  MASS;  Surgeon: Almond Lint, MD;  Location: [facility];  Service: General;  Laterality: N/A;   MOHS SURGERY Left 1/ 2016   Dr Margo Aye-- Basal cell   PROSTATE SURGERY     REMOVAL OF STONES  02/22/2022   Procedure: REMOVAL OF STONES;  Surgeon:  Jeani Hawking, MD;  Location: [facility] ENDOSCOPY;  Service: Gastroenterology;;   Dennison Mascot  02/22/2022   Procedure: Dennison Mascot;  Surgeon: Jeani Hawking, MD;  Location: [facility] ENDOSCOPY;  Service: Gastroenterology;;   TRANSURETHRAL RESECTION OF BLADDER TUMOR WITH MITOMYCIN-C N/A 06/22/2021   Procedure: TRANSURETHRAL RESECTION OF BLADDER TUMOR;  Surgeon: Marcine Matar, MD;  Location: [facility];  Service: Urology;  Laterality: N/A;   TRANSURETHRAL RESECTION OF PROSTATE N/A 02/26/2013   Procedure: TRANSURETHRAL RESECTION OF THE PROSTATE WITH GYRUS INSTRUMENTS;  Surgeon: Marcine Matar, MD;  Location:  [facility];  Service: Urology;  Laterality: N/A;   TRANSURETHRAL RESECTION OF PROSTATE N/A 06/22/2021   Procedure: TRANSURETHRAL RESECTION OF THE PROSTATE (TURP);  Surgeon: Marcine Matar, MD;  Location: [facility];  Service: Urology;  Laterality: N/A;   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   Insomnia    Prediabetes    Acute 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 (HCC)    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   Transaminitis 07/27/2021   UTI (urinary tract infection) 07/27/2021   CVA (cerebral vascular accident) (HCC) 07/27/2021   Fall 07/27/2021   Hyperglycemia 07/27/2021   Cholelithiasis 07/27/2021   Hypoxia 07/27/2021   Nausea and vomiting 07/27/2021   Acute metabolic encephalopathy 07/27/2021   Normocytic anemia 07/27/2021   Chronic back pain 07/27/2021   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   Closed fracture of first lumbar vertebra with routine healing 02/03/2021   Closed fracture of multiple ribs 11/18/2020   Anxiety 01/29/2020   Leg pain, bilateral 01/29/2020   Ingrown toenail 07/13/2019   Lumbar spondylosis 05/02/2018   Pain in left knee 03/09/2018   Osteoarthritis of left hip 01/16/2018   Trochanteric bursitis of left hip 01/16/2018   Preventative health care 09/26/2017   HTN (hypertension) 07/19/2015    Hyperlipidemia 07/19/2015   Great toe pain 02/11/2014   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   Obesity (BMI 30-39.9) 06/25/2013   Renal insufficiency 06/25/2013   Weakness of left arm 06/25/2013   Sebaceous cyst 03/03/2011   Sprain of lumbar region 07/31/2010   Rib pain, left 08/29/2009   Carotid artery stenosis, asymptomatic, bilateral 05/02/2009   Eczema, atopic 05/31/2008   Vitamin D deficiency 03/01/2008   BPH (benign prostatic hyperplasia) 08/06/2007   Fasting hyperglycemia 12/21/2006   History of right MCA infarct 2006    ONSET DATE: Acute exacerbation of chronic symptoms (most recent hospitalization 02/26/22 - 03/12/22, followed by SNF and home health)   REFERRING DIAG:  I63.9 (ICD-10-CM) - Cerebrovascular accident (CVA), unspecified mechanism (HCC)  W19.XXXD (ICD-10-CM) - Fall, subsequent encounter    THERAPY DIAG:  Muscle weakness (generalized)  Hemiplegia and hemiparesis following cerebral infarction affecting left non-dominant side (HCC)  Other lack of coordination  Acute pain of left shoulder  Attention and concentration deficit  Other abnormalities of gait and mobility  Rationale for Evaluation  and Treatment: Rehabilitation  PERTINENT HISTORY: PMHx includes L basal ganglia and corona radiata infarct 07/27/21, L lower homonymous quadrantanopia; R MCA infarct in 2006 w/ L-sided hemiparesis, NCD, carotid artery disease (bilateral), HTN, HLD,CKD3, vascular dementia, and hx of bladder cancer. He has had falls in 2023, fracturing his clavicle in August, then he had stomach surgery in Sept 2023.  He needs assist at baseline for ADLs. When last documented in acute rehab after having his gall-bladder removed, (03/09/22) he required moderate to maximum assist x2 for most ADLs (more assist for ADLs the require standing), and they recommended rehab in skilled nursing setting. He apparently got Covid at some point and also attended a SNF. Once he D/C home, he had some  episodes of combativeness at night  with "sun-downing."  He is communicative and appropriate, though appears easily confused and distracted, not fully oriented to time and place (Ax2, states date is 70 and cannot say what city he lives in). His wife provides most details, and he is participative, though a poor historian at times. In a nut shell- he has been suffering from dementia and post-stroke effects for years, and his issues were recently exacerbated in Sept of last year after abdominal surgery. He has less use of Lt "stroke side" and new, ritualistic "ticks" in mouth and Rt hand/arm.  Wife states that they do have a caregiver come M, W, F for 4 hours in the middle of the day to mainly help him bathe and dress, which usually tires him out. He has been more combative towards the end of the day as well.   PRECAUTIONS: Fall and Other: Lt hemi, poor cognition, LATEX and adhesive allergies, etc.  ;WEIGHT BEARING RESTRICTIONS: No   SUBJECTIVE:   SUBJECTIVE STATEMENT: Pt's son reports working on L handed tasks every day and notes fluctuations in ability to stand from various surfaces.  Pt accompanied by:  adult son and daughter   PAIN: no    FALLS: Has patient fallen in last 6 months? Yes. Number of falls 1 on 01/05/23 and 1 in last couple weeks (prior to eval) and 2-3 near falls (backwards) when getting up from various height chairs   LIVING ENVIRONMENT: Lives with: lives with their spouse Lives in: House/apartment Has following equipment at home: manual w/c, 2 w/w, weighted utensils, shower bench (for tub), bed cane, hospital bed, raised toilet, and possibly more  PLOF: Needs assistance with ADLs, Needs assistance with homemaking, Needs assistance with gait, and Needs assistance with transfers  PATIENT GOALS: 11/30/22: to return to playing golf, increased attention (less distraction) to task to engage in ADLs and leisure pursuits, increase Lt arm usage   OBJECTIVE: (All objective  assessments below are from initial evaluation on: 07/07/22 unless otherwise specified.)   HAND DOMINANCE: Right  ADLs: Overall ADLs: Wife states decreased ability since hospitalization in Sept 23.  Transfers/ambulation related to ADLs: Min A transfer, CGA for mobility Eating: set up Grooming: Mod A- needs assist with oral care, shaving, but can comb his hair UB Dressing: Min A  LB Dressing: Dep  Toileting:  Mod  Bathing: Mod  Tub Shower transfers: Min A  Equipment: Emergency planning/management officer, Grab bars, and Feeding equipment  IADLs: completely dependent for years now, except some ability to use microwave which is now not possible after hospitalization   MOBILITY STATUS: Needs Assist: CGA - Min A, Hx of falls, Festination, difficulty with turns, and neglect on Lt side  POSTURE COMMENTS:  rounded shoulders, forward head, and  weight shift left  ACTIVITY TOLERANCE: Activity tolerance: he became tired and somewhat agitated by the end of the OT evaluation today (after PT and OT about 2.5hours with mostly sedate activities in seated positions)  FUNCTIONAL OUTCOME MEASURES: [facility] 17/30  showing problems with visuospatial/executive function, attention, fluency, and orientation. (at least 26/30 is needed to be considered "normal")  Trinna Balloon in ADLs: 1/6  (very dependent) 11/30/22: PPT#2: 21.79 sec  UPPER EXTREMITY ROM:  from initial eval  Active ROM Right eval Left eval Left 11/30/22 Lt 12/29/22  Shoulder scaption 100 85 85 95  Shoulder abduction   80 91  Shoulder adduction      Shoulder extension      Shoulder internal rotation Reaches small of back Reaches to hip Reaches to hip   Shoulder external rotation Reaches back of head  Reaches up to ear Reaches up to ear   Elbow flexion 150 130    Elbow extension (-10*)  (-35*)    Wrist flexion 35 20    Wrist extension 50 10    (Blank rows = not tested)  UPPER EXTREMITY MMT:    Tested grossly as "push" and "pull": UE push 4-/5, UE pull  4/5  HAND FUNCTION: 12/29/22: Grip Rt: 44#, Lt: 20#    Eval: Grip strength: Right: 30 lbs; Left: 20 lbs  COORDINATION: 12/29/22: BBT Rt: 14 blocks, Lt: 8 blocks   Eval: Finger Nose Finger test: mildly ataxic but fairly accurate  Box and Blocks:  Right 11blocks, Left 5blocks  MUSCLE TONE:  Rt- normal, Lt- decreased and worsened by inattention/disuse  COGNITION: Overall cognitive status: Impaired and Difficulty to assess due to: severity of deficits; Poor attention, fluency, orientation, and executive function. Recall is fairly good as well as abstraction.  Seems to have personality changes due to vascular dementia- like an OCD type volitional perseveration of tapping with Rt hand that he can somewhat control.  He did become angry and agitated at the end of the session performing car transfer with spouse: he did shout at her.  VISION: Subjective report: no new changes per pt/spouse, uses "cheaters"   VISION ASSESSMENT: Visual fields seem intact though he had latent responses on the left side showing likely inattention/and/neglect, tracking somewhat delayed horizontally and vertically  PERCEPTION: Impaired: Inattention/neglect: does not attend to left visual field and does not attend to left side of body, Body scheme: poor quality of recognition with shaving, etc., and Spatial orientation: decreased in print and in orientation, especially to Lt side  PRAXIS: Not tested specifically but no overt/stated issues using pen, wearing glasses ,etc.   OBSERVATIONS: Pt taps and flicks and does seem to volitionally perseverate on making a rhythm/noise with Rt hand. He states he is aware of it and chooses to do it.  He does also seem to have mild resting and intention tremor.    TODAY'S TREATMENT:     01/11/23 Box and blocks: 9 blocks with L hand, completed 7 in 37 seconds however demonstrating increased distraction during remainder of time slot.  OT providing tactile cues (holding R hand) to  facilitate use of LUE. NMR: engaged in Lt shoulder flexion and anterior weight shifting with use of large therapy ball to facilitate increased L shoulder ROM, attention to L, and anterior weight shift as needed for sit > stand.  OT providing min-mod cues for setup of task and to maintain attention to task.  OT encouraged pt to place L hand at midline and R hand over top to  facilitate increased ROM and weight shift.  OT providing intermittent tactile cues to facilitate sustained attention to task and encouraged counting aloud for attention to task. Sit > stand: engaged in massed practice of sit > stand with bell placed at elevated table in front to provide encouragement for sit > stand.  OT providing max-fading to mod cues for each step for sit > stand, providing cues for B hand placement, scoot forward, B feet placement, and to lean forward to stand.  Pt demonstrating improved ease with simplified cues, however still relying on cues with each sit > stand due to decreased carryover secondary to dementia.     01/05/23 Sci Fit for 6 mins with min tactile cues at L elbow to maintain LUE in contact with handle bar.  Completed 2 mins forward, 2 mins backward, and 1 min forward, 1 min backward with cues for sustained attention to task.  Pt required max multimodal cues and cues for sequencing when standing for Sci Fit seat after completion.   WB thorugh BUE on counter top with mod multimodal cues for sequencing of modified cat/cow positioning to facilitate weight shifting and following commands.  Progressed to large amplitude reaching with RUE while WB through LUE for NMR as well as stabilization with reaching as spouse reports pt with increased tendency to fall forward.  Pt demonstrating good weight shifting and reaching with RUE, min cues for amplitude and to maintain LUE on counter top for support.  Transitioned to reaching with LUE for forced use of LUE to lower shelf and reaching outside BOS to L with tactile  cues to refrain from using RUE. Transitional movements: OT providing mod question cues for setup with hand placement and scooting towards edge of chair and mat table prior to sit > stand.  Pt continues to require cues for anterior weight shifting with sit > stand. Cues for larger steps with ambulation to/from treatment room with pt demonstrating good carryover and good upright posture during exit from therapy session. Large amplitude: engaged in forward forearm extension with finger flicks, OT providing demonstration and cues for technique and large amplitude.  Transitioned to modified LSVT big seated exercise with focus on reaching forward and towards floor.  OT providing box on floor with pillow to allow for improved reach towards floor with increased amplitude without needing to reach fully to floor.      01/03/23 Engaged in forced use of LUE during modified box and blocks task turning box 90* into vertical position to facilitate increased shoulder flexion with reaching.  OT holding R hand to facilitate increased use of LUE.  OT incorporated cognitive aspect of following directions and selecting certain color and/or number of blocks.  Once additional direction provided, pt losing focus with inability to finish initial task.  Pt also losing track of counting with inability to return once engaging in conversation, typically pt initiated.  Transitioned to forced use with placing golf balls into cup at various planes with LUE to facilitate increased reach, putting from seated position with cues to utilize BUE on stick used at simulated golf putter, and placing balls onto tees (pegs) with LUE.       PATIENT EDUCATION: Education details: ongoing condition specific education, setup of activities in home to facilitate increased anterior weight shift and L attention Person educated: Patient and Child(ren) Education method: Explanation, Demonstration, Tactile cues, and Verbal cues Education comprehension:  Explanation, demonstration, v.c  HOME EXERCISE PROGRAM: Access Code: [email or web address] URL: [email or web address] Date: 12/02/2022 Prepared by: [facility] -  Outpatient  Rehab - [facility]  Exercises - Seated Shoulder Flexion Towel Slide at Table Top  - 2 x daily - 10-15 reps - Seated Dowel Chest Press  - 2 x daily - 10-15 reps - Seated Shoulder Flexion AAROM with Dowel  - 2 x daily - 10-15 reps - Dead Bug (IN SITTING)  - 2 x daily - 10 reps    GOALS: Goals reviewed with patient? Yes   SHORT TERM GOALS: (STG required if POC>30 days) Target Date: 12/28/22   Pt will demo/state understanding of HEP with focus on LUE ROM. Goal status: IN PROGRESS  2.  Pt will consistently sustain focus to a simple functional task  in a minimally distracting environment x 6 mins with no more than 1 re-direct(ie: eating, functional task in clinic) Goal status: IN PROGRESS    LONG TERM GOALS: Target Date: 01/25/23  Pt will improve functional ability by decreased impairment per Trinna Balloon assessment from 1/6 to 2/6 or better, for better quality of life. Goal status:  IN PROGRESS  2.  He will show better functional use of left arm by improved box and blocks Test score from 5 blocks to at least 10 blocks. Baseline: 8 blocks on 11/16/22 Goal status: IN PROGRESS  3. Pt will don a flannel button front shirt consistently with min A and min v.c Baseline: mod A with mod vc Goal status:  IN PROGRESS  4.  Pt will use his LUE as an active assist for ADLS/leisure tasks grossly 40% of the time with min v.c Baseline: uses 25% Goal status:  IN PROGRESS  5.  Pt will open the newspaper and locate sports and used car section with no more than min v.c                           Goal status:   IN PROGRESS  6.  Pt will be able to get up/down from toilet with supervision and no cues.  Goal status: IN PROGRESS  7.  Pt will demonstrate improved sustained attention to complete breakfast in <1  hour  Baseline: currently requiring ~2 hours to complete eating breakfast Goal status: IN PROGRESS  ASSESSMENT:  CLINICAL IMPRESSION: Pt's adult children are providing physical assistance and supervision for pt while his spouse is out of town.  Pt's son reports fluctuations in ability to stand from various surfaces as well as noted when attempting to stand from wider/lower seat in waiting room.  Pt continues to benefit from multimodal cues for setup and sequencing of sit > stand, pt benefiting from one -two word cues for setup to allow for increased attention and recall.  Pt benefiting from motivating use of bell during sit > stand.  Discussed use of motivating items in home to facilitate increased attention to and engagement in exercises, ROM, and even to carry over to increased liquid intake.    OT Frequency/DURATION: 2x week x 8 weeks   PLANNED INTERVENTIONS: self care/ADL training, therapeutic exercise, therapeutic activity, neuromuscular re-education, manual therapy, balance training, functional mobility training, moist heat, cryotherapy, patient/family education, cognitive remediation/compensation, visual/perceptual remediation/compensation, psychosocial skills training, energy conservation, coping strategies training, DME and/or AE instructions, and Re-evaluation  RECOMMENDED OTHER SERVICES: He has already seen PT for balance and functional mobility, he would probably benefit from speech for cognitive issues as well, though OT will be working with cognition somewhat functionally now.  CONSULTED AND AGREED WITH PLAN OF CARE: Patient and family  member/caregiver  PLAN FOR NEXT SESSION:  Continue on as appropriate, try to draw attention to tasks and Lt side more, focus on decreased burden of care and incorporating motivating factors to facilitate increased attention to task    Yara Tomkinson, [facility], OTR/L 3:52 PM 01/11/23

## 2023-01-12 ENCOUNTER — Ambulatory Visit: Payer: PPO

## 2023-01-12 DIAGNOSIS — R262 Difficulty in walking, not elsewhere classified: Secondary | ICD-10-CM

## 2023-01-12 DIAGNOSIS — I69354 Hemiplegia and hemiparesis following cerebral infarction affecting left non-dominant side: Secondary | ICD-10-CM

## 2023-01-12 DIAGNOSIS — R296 Repeated falls: Secondary | ICD-10-CM

## 2023-01-12 DIAGNOSIS — R2689 Other abnormalities of gait and mobility: Secondary | ICD-10-CM

## 2023-01-12 DIAGNOSIS — Z9181 History of falling: Secondary | ICD-10-CM

## 2023-01-12 DIAGNOSIS — M6281 Muscle weakness (generalized): Secondary | ICD-10-CM

## 2023-01-12 NOTE — Therapy (Signed)
OUTPATIENT PHYSICAL THERAPY NEURO TREATMENT NOTE   Patient Name: William [name] MRN: [id number] DOB:Dec 23, 1940, 82 y.o., male Today's Date: 01/12/2023  PCP: Donato Schultz  DO REFERRING PROVIDER: same    END OF SESSION:  PT End of Session - 01/12/23 1442     Visit Number 51    Number of Visits 54    Date for PT Re-Evaluation 01/22/23    Authorization Type HTA    Progress Note Due on Visit 60    PT Start Time 1445    PT Stop Time 1530    PT Time Calculation (min) 45 min    Equipment Utilized During Treatment Gait belt    Activity Tolerance Patient tolerated treatment well    Behavior During Therapy WFL for tasks assessed/performed                 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   HTN (hypertension) 07/19/2015   Hyperlipidemia    Major neurocognitive disorder 01/09/2014   Mild, related to stroke history   Nocturia    Renal insufficiency 06/25/2013   S/P carotid endarterectomy    BILATERAL ICA--  PATENT PER DUPLEX  05-19-2012   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   Right calf   Urinary frequency    Vitamin D deficiency    Past Surgical History:  Procedure Laterality Date   APPENDECTOMY  AS CHILD   CARDIOVASCULAR STRESS TEST  03-27-2012  DR CRENSHAW   LOW RISK LEXISCAN STUDY-- PROBABLE NORMAL PERFUSION AND SOFT TISSUE ATTENUATION/  NO ISCHEMIA/ EF 51%   CAROTID ENDARTERECTOMY Bilateral LEFT  11-12-2008  DR GREG HAYES   RIGHT ICA  2006  ([facility])   CHOLECYSTECTOMY N/A 02/23/2022   Procedure: LAPAROSCOPIC CHOLECYSTECTOMY;   Surgeon: Quentin Ore, MD;  Location: [facility] OR;  Service: General;  Laterality: N/A;   CYSTOSCOPY W/ RETROGRADES Bilateral 06/22/2021   Procedure: CYSTOSCOPY WITH RETROGRADE PYELOGRAM;  Surgeon: Marcine Matar, MD;  Location: [facility];  Service: Urology;  Laterality: Bilateral;   CYSTOSCOPY WITH LITHOLAPAXY N/A 02/26/2013   Procedure: CYSTOSCOPY WITH LITHOLAPAXY;  Surgeon: Marcine Matar, MD;  Location: [facility];  Service: Urology;  Laterality: N/A;   ENDOSCOPIC RETROGRADE CHOLANGIOPANCREATOGRAPHY (ERCP) WITH PROPOFOL N/A 02/22/2022   Procedure: ENDOSCOPIC RETROGRADE CHOLANGIOPANCREATOGRAPHY (ERCP) WITH PROPOFOL;  Surgeon: Jeani Hawking, MD;  Location[facility] END[facility]COPY;  Service: Gastroenterology;  Laterality: N/A;   EYE SURGERY  Jan. 2016   cataract surgery both eyes   INGUINAL HERNIA REPAIR Right 11-08-2006   IR KYPHO EA ADDL LEVEL THORACIC OR LUMBAR  02/12/2021   IR RADIOLOGIST EVAL & MGMT  02/18/2021   MASS EXCISION N/A 03/03/2016   Procedure: EXCISION OF BACK  MASS;  Surgeon: Almond Lint, MD;  Location: [facility];  Service: General;  Laterality: N/A;   MOHS SURGERY Left 1/ 2016   Dr Margo Aye-- Basal cell   PROSTATE SURGERY     REMOVAL OF STONES  02/22/2022   Procedure: REMOVAL  OF STONES;  Surgeon: Jeani Hawking, MD;  Location: [facility] ENDOSCOPY;  Service: Gastroenterology;;   William [name]  02/22/2022   Procedure: William [name];  Surgeon: Jeani Hawking, MD;  Location: [facility] ENDOSCOPY;  Service: Gastroenterology;;   TRANSURETHRAL RESECTION OF BLADDER TUMOR WITH MITOMYCIN-C N/A 06/22/2021   Procedure: TRANSURETHRAL RESECTION OF BLADDER TUMOR;  Surgeon: Marcine Matar, MD;  Location: [facility];  Service: Urology;  Laterality: N/A;   TRANSURETHRAL RESECTION OF PROSTATE N/A 02/26/2013   Procedure: TRANSURETHRAL RESECTION OF THE PROSTATE WITH GYRUS INSTRUMENTS;  Surgeon: Marcine Matar, MD;  Location: [facility];   Service: Urology;  Laterality: N/A;   TRANSURETHRAL RESECTION OF PROSTATE N/A 06/22/2021   Procedure: TRANSURETHRAL RESECTION OF THE PROSTATE (TURP);  Surgeon: Marcine Matar, MD;  Location: [facility];  Service: Urology;  Laterality: N/A;   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   Insomnia    Prediabetes    Acute 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 (HCC)    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   Transaminitis 07/27/2021   UTI (urinary tract infection) 07/27/2021   CVA (cerebral vascular accident) (HCC) 07/27/2021   Fall 07/27/2021   Hyperglycemia 07/27/2021   Cholelithiasis 07/27/2021   Hypoxia 07/27/2021   Nausea and vomiting 07/27/2021   Acute metabolic encephalopathy 07/27/2021   Normocytic anemia 07/27/2021   Chronic back pain 07/27/2021   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   Closed fracture of first lumbar vertebra with routine healing 02/03/2021   Closed fracture of multiple ribs 11/18/2020   Anxiety 01/29/2020   Leg pain, bilateral 01/29/2020   Ingrown toenail 07/13/2019   Lumbar spondylosis 05/02/2018   Pain in left knee 03/09/2018   Osteoarthritis of left hip 01/16/2018   Trochanteric bursitis of left hip 01/16/2018   Preventative health care 09/26/2017   HTN (hypertension) 07/19/2015   Hyperlipidemia 07/19/2015    Great toe pain 02/11/2014   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   Obesity (BMI 30-39.9) 06/25/2013   Renal insufficiency 06/25/2013   Weakness of left arm 06/25/2013   Sebaceous cyst 03/03/2011   Sprain of lumbar region 07/31/2010   Rib pain, left 08/29/2009   Carotid artery stenosis, asymptomatic, bilateral 05/02/2009   Eczema, atopic 05/31/2008   Vitamin D deficiency 03/01/2008   BPH (benign prostatic hyperplasia) 08/06/2007   Fasting hyperglycemia 12/21/2006   History of right MCA infarct 2006    ONSET DATE: 06/29/22 REFERRING DIAG:  Diagnosis  I63.9 (ICD-10-CM) - Cerebrovascular accident (CVA), unspecified mechanism (HCC)  W19.XXXD (ICD-10-CM) - Fall, subsequent encounter    THERAPY DIAG:  Muscle weakness (generalized)  Hemiplegia and hemiparesis following cerebral infarction affecting left non-dominant side (HCC)  Other abnormalities of gait and mobility  Repeated falls  Difficulty in walking, not elsewhere classified  History of falling  Rationale for Evaluation and Treatment: Rehabilitation  SUBJECTIVE:  SUBJECTIVE STATEMENT:  No falls with caregivers  PERTINENT HISTORY:  William [name] is a 82 year old man with dementia, CKD, HTN, CAD, HLD and history of CVA  (2006, 2023), Parkinson's  PAIN:  Are you having pain? No  PRECAUTIONS: Fall  WEIGHT BEARING RESTRICTIONS: No  FALLS: Has patient fallen in last 6 months? No  LIVING ENVIRONMENT: Lives with: lives with their family and lives with their spouse Lives in: House/apartment Stairs:  1 brick high step Has following equipment at home: Environmental consultant - 2 wheeled, Wheelchair (manual), Shower bench, bed side commode, Grab bars, and hospital bed, sliding pad for car  PLOF: Independent with basic ADLs and Independent with  household mobility without device  PATIENT GOALS: Walk around the block with his wife. Be able to use the dining room chair rather than have to use the W/C. Increased I with bed mobility and simple tasks at home like make some coffee or brush his teeth, Step over tub to shower, has bench, but he can't really use it. Up and down the one step to get to back deck. 11/23/22: Patient's wife [name] updated his goals today: Walk around store (gets distracted), swing a golf club again (thinking of driving range), get up on his own, walk without leaning forward, more recognition of the left side (from CVA), not need gait belt, OT-- be able to get dressed more on his own, navigate the newspaper.  OBJECTIVE:   TODAY'S TREATMENT: 01/12/23 Activity Comments  NU-step level 4 x 8 min Tactile/verbal cues for increased stride length vs speed  W/c mgmt and mobility Training in tcehniques for w/c walking and transfers from chair level to EOM w/ cues for brake mgmt and sequence for transfer  Step-ups 1x10 4", 6", 8" w/ BUE support, alternating fashion for coordination  balance -Standing on foam: EO/EC 6x15 sec. Head/trunk turns 5x left/right -standing with foot on step 3x15 sec  Transfer training Sit to stand from w/c level 3x5 reps tactile/verbal cues for LUE extension and trunk flexion to extension        TODAY'S TREATMENT: 01/10/23 Activity Comments  NU-step level 5 x 6 min    Gait training -Training in use of U-step walker to improve safety with gait and prevent instances of festinating.  Good return demonstration performed w/ SBA-CGA on level surfaces x 200 ft -U-step with lazer line for step height/length with verbal cues for carryover -gait on pavement and up/down ramp w/ CGA for stability  5xSTS Trial 1) 67 sec--difficulty sequencing Trial 2) 36 sec--tactile/verbal cues for speed/sequence  Bed mobility -sit to supine: mod I -rolling: mod I -supine to sit: mod I  Transfer training -sit to stand w/  SBA-CGA for cues in sequence           PATIENT EDUCATION: Education details: Cues for visual, verbal, tactile cues for improved posture, slowed pace of gait Person educated: Patient and Spouse Education method: Explanation, Demonstration, Tactile cues, and Verbal cues Education comprehension: verbalized understanding, returned demonstration, verbal cues required, tactile cues required, and needs further education    (Measures in this section from initial evaluation unless otherwise noted) DIAGNOSTIC FINDINGS:  MRI 2021 with degenerative changes in lumbar spine, L3-4 R subarticular stenosis COGNITION: Overall cognitive status: History of cognitive impairments - at baseline SENSATION: Not tested COORDINATION: Moderately impaired BLE MUSCLE LENGTH: Hamstrings: severely restricted B Thomas test: Severely restricted B. POSTURE: rounded shoulders, forward head, increased thoracic kyphosis, posterior pelvic tilt, and flexed trunk  LOWER EXTREMITY ROM:  BLE extremely stiff throughout, limited hip ROM in all planes, B knees limited in extension. LOWER EXTREMITY MMT:  3+/5 throughout. Unable to determine accuracy of MMT due to cognition. Functionally demosntrates poor coordinated activation and poor muscular endurance. BED MOBILITY: Min A for Supine<> Sit. Patient was using a ladder in his hospital bed and could move MI. TRANSFERS: Min A, mod VC, tends to lean back.  11/02/22 independent with use of hands on arm chair with a couple of tries CURB: Min A-Has one step out to his deck. STAIRS: N/A  GAIT: Gait pattern: step to pattern, decreased arm swing- Right, decreased arm swing- Left, decreased step length- Right, decreased step length- Left, shuffling, festinating, trunk flexed, narrow BOS, poor foot clearance- Right, and poor foot clearance- Left Distance walked: 70' Assistive device utilized: Environmental consultant - 2 wheeled Level of assistance: Occasional min A Comments: mod VC to stay close to  walker, take longer steps. FUNCTIONAL TESTS:  5 times sit to stand: 38.23  09/29/22  could not performs 5XSTS, 11/02/22 = 47 seconds with cues to use arms Timed up and go (TUG): 47.91  with RW, 07/21/22 TUG 29  seconds without device, 09/29/22 25 seconds, 11/02/22 TUG 20 seconds                                                                            PATIENT EDUCATION: Education details: Progress towards goals, POC Person educated: Patient and Spouse Education method: Programmer, multimedia, Facilities manager, and Verbal cues  Education comprehension: verbalized understanding, returned demonstration, verbal cues required, and needs further education  HOME EXERCISE PROGRAM: TBD  GOALS: Goals reviewed with patient? Yes  SHORT TERM GOALS: Target date: 12/22/22  Perform HEP for PWR! Moves with cues from family. Baseline: Pt's wife, [name], notes walking and hitting beach ball as main HEP. Goal status: GOAL MET, 12/20/2022   2. Decrease 5 x STS to < 35 sec to demonstrate improved functional strength  Baseline: 47 seconds on 5/21 per note; 34.5 sec UE support 12/20/2022  Goal status: GOAL MET, 12/20/2022   3. Perform sit <> stand transfers from average chair height with Supervision.  Baseline: CGA  12/20/2022; (01/10/23) CGA  Goal status: NOT MET  LONG TERM GOALS: Target date: 01/22/23  Patient will perform HEP progression with caregiver assist. Baseline: has minimal HEP Goal status: REVISED  2.  Decrease 5 x STS to < 25 sec to demonstrate improved functional strength Baseline: 47 seconds on 5/21 per note; (01/10/23) 36 sec w/ cues for speed/sequence Goal status: REVISED  3. Improve quality of gait to be able to ambulate x 100 feet with supervision. Baseline: CGA with cues  Goal status: REVISED  4.  Patient will perform his bed mobility with MI including rolling, sit<>supine, and scooting in bed. Baseline: on hard surface mat he is independent but on soft bed at home he struggles, wife reports only  needs a hand to get up now 11/02/22; (01/10/23) modified indep.  Goal status: MET  5.  Patient will demonstrate floor<>stand with CGA and verbal cues for patient's wife to be able to assist. Baseline:  Has to call 9-1-1 Goal status: IN PROGRESS  ASSESSMENT:  CLINICAL IMPRESSION:  Initiated with training from w/c level  for w/c walking to promote safe means of mobility and practice in transfers from height. Good return demonstration for w/c mgmt and mobility w/ supervision for verbal cues in brake seequence and safety.  Excellent performance with repetitive step-ups to increasing heights for leg strength and safety with negotiation of curbs but requiring UE support to maintain stability. Continued with focus on static standing balance with use of compliant surfaces.  Transfers with CGA to facilitate tactile cues for trunk flexion over BOS and pushing to standing position and safety awareness for stand to sit for use of UE. Continued sessions to progress POC details  OBJECTIVE IMPAIRMENTS: Abnormal gait, decreased activity tolerance, decreased balance, decreased cognition, decreased coordination, decreased endurance, decreased mobility, difficulty walking, decreased ROM, decreased strength, decreased safety awareness, impaired flexibility, impaired UE functional use, improper body mechanics, and postural dysfunction.   PLAN:  PT FREQUENCY: 2x/week  PT DURATION: 12 weeks  PLANNED INTERVENTIONS: Therapeutic exercises, Therapeutic activity, Neuromuscular re-education, Balance training, Gait training, Patient/Family education, Self Care, Joint mobilization, Stair training, Moist heat, and Manual therapy  PLAN FOR NEXT SESSION:   Try U-step if in clinc. (Would ACT potentially be an option for continued community fitness?-Amy can talk about this when she sees him next)  Dion Body, PT 01/12/2023, 2:42 PM  [place] [facility] [address], [address] [place],  [place], [address] Phone: [phone]   Fax:  [phone]

## 2023-01-13 ENCOUNTER — Ambulatory Visit: Payer: PPO | Attending: Family Medicine | Admitting: Occupational Therapy

## 2023-01-13 DIAGNOSIS — M6281 Muscle weakness (generalized): Secondary | ICD-10-CM

## 2023-01-13 DIAGNOSIS — R278 Other lack of coordination: Secondary | ICD-10-CM | POA: Diagnosis not present

## 2023-01-13 DIAGNOSIS — R2689 Other abnormalities of gait and mobility: Secondary | ICD-10-CM | POA: Diagnosis not present

## 2023-01-13 DIAGNOSIS — I69354 Hemiplegia and hemiparesis following cerebral infarction affecting left non-dominant side: Secondary | ICD-10-CM | POA: Diagnosis not present

## 2023-01-13 DIAGNOSIS — M25512 Pain in left shoulder: Secondary | ICD-10-CM | POA: Diagnosis not present

## 2023-01-13 DIAGNOSIS — R41840 Attention and concentration deficit: Secondary | ICD-10-CM | POA: Diagnosis not present

## 2023-01-13 NOTE — Therapy (Addendum)
OUTPATIENT OCCUPATIONAL THERAPY TREATMENT NOTE  Patient Name: William [name] MRN: [id number] DOB:01-16-1941, 82 y.o., male Today's Date: 01/13/2023  PCP & REFERRING PROVIDER:  [name], [name], DO    END OF SESSION:  OT End of Session - 01/13/23 1607     Visit Number 42    Number of Visits 47    Date for OT Re-Evaluation 01/25/23    Authorization Type [employer]    OT Start Time 1450    OT Stop Time 1535    OT Time Calculation (min) 45 min    Activity Tolerance Patient tolerated treatment well;No increased pain;Patient limited by fatigue    Behavior During Therapy [facility] for tasks assessed/performed;Restless;Impulsive                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   HTN (hypertension) 07/19/2015   Hyperlipidemia    Major neurocognitive disorder 01/09/2014   Mild, related to stroke history   Nocturia    Renal insufficiency 06/25/2013   S/P carotid endarterectomy    BILATERAL ICA--  PATENT PER DUPLEX  05-19-2012   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/26/2017   Right calf   Urinary frequency    Vitamin D deficiency    Past Surgical History:  Procedure Laterality Date   APPENDECTOMY  AS CHILD   CARDIOVASCULAR STRESS TEST  03-27-2012  DR CRENSHAW   LOW RISK LEXISCAN STUDY-- PROBABLE NORMAL PERFUSION AND SOFT TISSUE ATTENUATION/  NO ISCHEMIA/ EF 51%   CAROTID ENDARTERECTOMY Bilateral LEFT  11-12-2008  DR GREG HAYES   RIGHT ICA  2006  ([facility])   CHOLECYSTECTOMY N/A 02/23/2022   Procedure: LAPAROSCOPIC CHOLECYSTECTOMY;   Surgeon: Quentin Ore, MD;  Location: [facility] OR;  Service: General;  Laterality: N/A;   CYSTOSCOPY W/ RETROGRADES Bilateral 06/22/2021   Procedure: CYSTOSCOPY WITH RETROGRADE PYELOGRAM;  Surgeon: Marcine Matar, MD;  Location: [facility];  Service: Urology;  Laterality: Bilateral;   CYSTOSCOPY WITH LITHOLAPAXY N/A 02/26/2013   Procedure: CYSTOSCOPY WITH LITHOLAPAXY;  Surgeon: Marcine Matar, MD;  Location: [facility];  Service: Urology;  Laterality: N/A;   ENDOSCOPIC RETROGRADE CHOLANGIOPANCREATOGRAPHY (ERCP) WITH PROPOFOL N/A 02/22/2022   Procedure: ENDOSCOPIC RETROGRADE CHOLANGIOPANCREATOGRAPHY (ERCP) WITH PROPOFOL;  Surgeon: Jeani Hawking, MD;  Location: [facility];  Service: Gastroenterology;  Laterality: N/A;   EYE SURGERY  Jan. 2016   cataract surgery both eyes   INGUINAL HERNIA REPAIR Right 11-08-2006   IR KYPHO EA ADDL LEVEL THORACIC OR LUMBAR  02/12/2021   IR RADIOLOGIST EVAL & MGMT  02/18/2021   MASS EXCISION N/A 03/03/2016   Procedure: EXCISION OF BACK  MASS;  Surgeon: Almond Lint, MD;  Location: [facility];  Service: General;  Laterality: N/A;   MOHS SURGERY Left 1/ 2016   Dr Margo Aye-- Basal cell   PROSTATE SURGERY     REMOVAL OF STONES  02/22/2022   Procedure: REMOVAL OF STONES;  Surgeon: Jeani Hawking, MD;  Location: [facility] ENDOSCOPY;  Service: Gastroenterology;;   Dennison Mascot  02/22/2022   Procedure: Dennison Mascot;  Surgeon: Jeani Hawking, MD;  Location: [facility] ENDOSCOPY;  Service: Gastroenterology;;   TRANSURETHRAL RESECTION OF BLADDER TUMOR WITH MITOMYCIN-C N/A 06/22/2021   Procedure: TRANSURETHRAL RESECTION OF BLADDER TUMOR;  Surgeon: Marcine Matar, MD;  Location: [facility];  Service: Urology;  Laterality: N/A;   TRANSURETHRAL RESECTION OF PROSTATE N/A 02/26/2013   Procedure: TRANSURETHRAL RESECTION OF THE PROSTATE WITH GYRUS INSTRUMENTS;  Surgeon: Marcine Matar, MD;  Location: [facility];   Service: Urology;  Laterality: N/A;   TRANSURETHRAL RESECTION OF PROSTATE N/A 06/22/2021   Procedure: TRANSURETHRAL RESECTION OF THE PROSTATE (TURP);  Surgeon: Marcine Matar, MD;  Location: [facility];  Service: Urology;  Laterality: N/A;   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   Insomnia    Prediabetes    Acute 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 (HCC)    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   Transaminitis 07/27/2021   UTI (urinary tract infection) 07/27/2021   CVA (cerebral vascular accident) (HCC) 07/27/2021   Fall 07/27/2021   Hyperglycemia 07/27/2021   Cholelithiasis 07/27/2021   Hypoxia 07/27/2021   Nausea and vomiting 07/27/2021   Acute metabolic encephalopathy 07/27/2021   Normocytic anemia 07/27/2021   Chronic back pain 07/27/2021   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   Closed fracture of first lumbar vertebra with routine healing 02/03/2021   Closed fracture of multiple ribs 11/18/2020   Anxiety 01/29/2020   Leg pain, bilateral 01/29/2020   Ingrown toenail 07/13/2019   Lumbar spondylosis 05/02/2018   Pain in left knee 03/09/2018   Osteoarthritis of left hip 01/16/2018   Trochanteric bursitis of left hip 01/16/2018   Preventative health care 09/26/2017   HTN (hypertension) 07/19/2015   Hyperlipidemia 07/19/2015    Great toe pain 02/11/2014   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   Obesity (BMI 30-39.9) 06/25/2013   Renal insufficiency 06/25/2013   Weakness of left arm 06/25/2013   Sebaceous cyst 03/03/2011   Sprain of lumbar region 07/31/2010   Rib pain, left 08/29/2009   Carotid artery stenosis, asymptomatic, bilateral 05/02/2009   Eczema, atopic 05/31/2008   Vitamin D deficiency 03/01/2008   BPH (benign prostatic hyperplasia) 08/06/2007   Fasting hyperglycemia 12/21/2006   History of right MCA infarct 2006    ONSET DATE: Acute exacerbation of chronic symptoms (most recent hospitalization 02/26/22 - 03/12/22, followed by SNF and home health)   REFERRING DIAG:  I63.9 (ICD-10-CM) - Cerebrovascular accident (CVA), unspecified mechanism (HCC)  W19.XXXD (ICD-10-CM) - Fall, subsequent encounter    THERAPY DIAG:  Muscle weakness (generalized)  Hemiplegia and hemiparesis following cerebral infarction affecting left non-dominant side (HCC)  Other lack of coordination  Acute pain of left shoulder  Attention and concentration deficit  Other abnormalities of gait and mobility  Rationale for Evaluation and Treatment: Rehabilitation  PERTINENT HISTORY: PMHx includes  L basal ganglia and corona radiata infarct 07/27/21, L lower homonymous quadrantanopia; R MCA infarct in 2006 w/ L-sided hemiparesis, NCD, carotid artery disease (bilateral), HTN, HLD,CKD3, vascular dementia, and hx of bladder cancer. He has had falls in 2023, fracturing his clavicle in August, then he had stomach surgery in Sept 2023.  He needs assist at baseline for ADLs. When last documented in acute rehab after having his gall-bladder removed, (03/09/22) he required moderate to maximum assist x2 for most ADLs (more assist for ADLs the require standing), and they recommended rehab in skilled nursing setting. He apparently got Covid at some point and also attended a SNF. Once he D/C home, he had some episodes of combativeness at  night  with "sun-downing."  He is communicative and appropriate, though appears easily confused and distracted, not fully oriented to time and place (Ax2, states date is 38 and cannot say what city he lives in). His wife provides most details, and he is participative, though a poor historian at times. In a nut shell- he has been suffering from dementia and post-stroke effects for years, and his issues were recently exacerbated in Sept of last year after abdominal surgery. He has less use of Lt "stroke side" and new, ritualistic "ticks" in mouth and Rt hand/arm.  Wife states that they do have a caregiver come M, W, F for 4 hours in the middle of the day to mainly help him bathe and dress, which usually tires him out. He has been more combative towards the end of the day as well.   PRECAUTIONS: Fall and Other: Lt hemi, poor cognition, LATEX and adhesive allergies, etc.  ;WEIGHT BEARING RESTRICTIONS: No   SUBJECTIVE:   SUBJECTIVE STATEMENT: Pt reports that his spouse comes back tomorrow evening.  Pt accompanied by:  adult son and daughter   PAIN: no    FALLS: Has patient fallen in last 6 months? Yes. Number of falls 1 on 01/05/23 and 1 in last couple weeks (prior to eval) and 2-3 near falls (backwards) when getting up from various height chairs   LIVING ENVIRONMENT: Lives with: lives with their spouse Lives in: House/apartment Has following equipment at home: manual w/c, 2 w/w, weighted utensils, shower bench (for tub), bed cane, hospital bed, raised toilet, and possibly more  PLOF: Needs assistance with ADLs, Needs assistance with homemaking, Needs assistance with gait, and Needs assistance with transfers  PATIENT GOALS: 11/30/22: to return to playing golf, increased attention (less distraction) to task to engage in ADLs and leisure pursuits, increase Lt arm usage   OBJECTIVE: (All objective assessments below are from initial evaluation on: 07/07/22 unless otherwise specified.)   HAND  DOMINANCE: Right  ADLs: Overall ADLs: Wife states decreased ability since hospitalization in Sept 23.  Transfers/ambulation related to ADLs: Min A transfer, CGA for mobility Eating: set up Grooming: Mod A- needs assist with oral care, shaving, but can comb his hair UB Dressing: Min A  LB Dressing: Dep  Toileting:  Mod  Bathing: Mod  Tub Shower transfers: Min A  Equipment: Emergency planning/management officer, Grab bars, and Feeding equipment  IADLs: completely dependent for years now, except some ability to use microwave which is now not possible after hospitalization   MOBILITY STATUS: Needs Assist: CGA - Min A, Hx of falls, Festination, difficulty with turns, and neglect on Lt side  POSTURE COMMENTS:  rounded shoulders, forward head, and weight shift left  ACTIVITY TOLERANCE: Activity tolerance: he became tired and somewhat agitated by the end of the  OT evaluation today (after PT and OT about 2.5hours with mostly sedate activities in seated positions)  FUNCTIONAL OUTCOME MEASURES: [facility] 17/30  showing problems with visuospatial/executive function, attention, fluency, and orientation. (at least 26/30 is needed to be considered "normal")  Trinna Balloon in ADLs: 1/6  (very dependent) 11/30/22: PPT#2: 21.79 sec  UPPER EXTREMITY ROM:  from initial eval  Active ROM Right eval Left eval Left 11/30/22 Lt 12/29/22  Shoulder scaption 100 85 85 95  Shoulder abduction   80 91  Shoulder adduction      Shoulder extension      Shoulder internal rotation Reaches small of back Reaches to hip Reaches to hip   Shoulder external rotation Reaches back of head  Reaches up to ear Reaches up to ear   Elbow flexion 150 130    Elbow extension (-10*)  (-35*)    Wrist flexion 35 20    Wrist extension 50 10    (Blank rows = not tested)  UPPER EXTREMITY MMT:    Tested grossly as "push" and "pull": UE push 4-/5, UE pull 4/5  HAND FUNCTION: 12/29/22: Grip Rt: 44#, Lt: 20#    Eval: Grip strength: Right: 30 lbs;  Left: 20 lbs  COORDINATION: 12/29/22: BBT Rt: 14 blocks, Lt: 8 blocks   Eval: Finger Nose Finger test: mildly ataxic but fairly accurate  Box and Blocks:  Right 11blocks, Left 5blocks  MUSCLE TONE:  Rt- normal, Lt- decreased and worsened by inattention/disuse  COGNITION: Overall cognitive status: Impaired and Difficulty to assess due to: severity of deficits; Poor attention, fluency, orientation, and executive function. Recall is fairly good as well as abstraction.  Seems to have personality changes due to vascular dementia- like an OCD type volitional perseveration of tapping with Rt hand that he can somewhat control.  He did become angry and agitated at the end of the session performing car transfer with spouse: he did shout at her.  VISION: Subjective report: no new changes per pt/spouse, uses "cheaters"   VISION ASSESSMENT: Visual fields seem intact though he had latent responses on the left side showing likely inattention/and/neglect, tracking somewhat delayed horizontally and vertically  PERCEPTION: Impaired: Inattention/neglect: does not attend to left visual field and does not attend to left side of body, Body scheme: poor quality of recognition with shaving, etc., and Spatial orientation: decreased in print and in orientation, especially to Lt side  PRAXIS: Not tested specifically but no overt/stated issues using pen, wearing glasses ,etc.   OBSERVATIONS: Pt taps and flicks and does seem to volitionally perseverate on making a rhythm/noise with Rt hand. He states he is aware of it and chooses to do it.  He does also seem to have mild resting and intention tremor.    TODAY'S TREATMENT:     01/13/23 UB dressing: engaged in massed practice with donning/doffing button up shirt with use of mirror for visual cues to facilitate increased amplitude.  OT providing demonstration from infront and then behind pt, incorporating mirror, to facilitate increased carryover and understanding of  technique.  Pt able to complete with max cues for sequencing but able to complete with only min assist when advancing shirt around back.  Pt will continue to benefit from practice in sessions and at home. Sit > stand: engaged in massed practice of sit > stand from w/c with mirror placed in front and motivating item to remove once fully upright.  OT providing max-fading to mod cues for each step for sit > stand, providing cues  for B hand placement, scoot forward, B feet placement, and to lean forward to stand.  Pt requiring increased cues for gaze with looking down and out ~3 feet in front to facilitate increased anterior weight shift and lift off into standing.  Pt demonstrating increased difficulty from w/c this session, despite slightly higher than mat table and BUE arm rests to aid in pushing up.    01/11/23 Box and blocks: 9 blocks with L hand, completed 7 in 37 seconds however demonstrating increased distraction during remainder of time slot.  OT providing tactile cues (holding R hand) to facilitate use of LUE. NMR: engaged in Lt shoulder flexion and anterior weight shifting with use of large therapy ball to facilitate increased L shoulder ROM, attention to L, and anterior weight shift as needed for sit > stand.  OT providing min-mod cues for setup of task and to maintain attention to task.  OT encouraged pt to place L hand at midline and R hand over top to facilitate increased ROM and weight shift.  OT providing intermittent tactile cues to facilitate sustained attention to task and encouraged counting aloud for attention to task. Sit > stand: engaged in massed practice of sit > stand with bell placed at elevated table in front to provide encouragement for sit > stand.  OT providing max-fading to mod cues for each step for sit > stand, providing cues for B hand placement, scoot forward, B feet placement, and to lean forward to stand.  Pt demonstrating improved ease with simplified cues, however still  relying on cues with each sit > stand due to decreased carryover secondary to dementia.     01/05/23 Sci Fit for 6 mins with min tactile cues at L elbow to maintain LUE in contact with handle bar.  Completed 2 mins forward, 2 mins backward, and 1 min forward, 1 min backward with cues for sustained attention to task.  Pt required max multimodal cues and cues for sequencing when standing for Sci Fit seat after completion.   WB thorugh BUE on counter top with mod multimodal cues for sequencing of modified cat/cow positioning to facilitate weight shifting and following commands.  Progressed to large amplitude reaching with RUE while WB through LUE for NMR as well as stabilization with reaching as spouse reports pt with increased tendency to fall forward.  Pt demonstrating good weight shifting and reaching with RUE, min cues for amplitude and to maintain LUE on counter top for support.  Transitioned to reaching with LUE for forced use of LUE to lower shelf and reaching outside BOS to L with tactile cues to refrain from using RUE. Transitional movements: OT providing mod question cues for setup with hand placement and scooting towards edge of chair and mat table prior to sit > stand.  Pt continues to require cues for anterior weight shifting with sit > stand. Cues for larger steps with ambulation to/from treatment room with pt demonstrating good carryover and good upright posture during exit from therapy session. Large amplitude: engaged in forward forearm extension with finger flicks, OT providing demonstration and cues for technique and large amplitude.  Transitioned to modified LSVT big seated exercise with focus on reaching forward and towards floor.  OT providing box on floor with pillow to allow for improved reach towards floor with increased amplitude without needing to reach fully to floor.      PATIENT EDUCATION: Education details: ongoing condition specific education, setup of activities in home to  facilitate increased anterior weight shift  and L attention Person educated: Patient and Child(ren) Education method: Explanation, Demonstration, Tactile cues, and Verbal cues Education comprehension: Explanation, demonstration, v.c  HOME EXERCISE PROGRAM: Access Code: [email or web address] URL: [email or web address] Date: 12/02/2022 Prepared by: [facility]  Rehab - [facility]  Exercises - Seated Shoulder Flexion Towel Slide at Table Top  - 2 x daily - 10-15 reps - Seated Dowel Chest Press  - 2 x daily - 10-15 reps - Seated Shoulder Flexion AAROM with Dowel  - 2 x daily - 10-15 reps - Dead Bug (IN SITTING)  - 2 x daily - 10 reps    GOALS: Goals reviewed with patient? Yes   SHORT TERM GOALS: (STG required if POC>30 days) Target Date: 12/28/22   Pt will demo/state understanding of HEP with focus on LUE ROM. Goal status: IN PROGRESS  2.  Pt will consistently sustain focus to a simple functional task  in a minimally distracting environment x 6 mins with no more than 1 re-direct(ie: eating, functional task in clinic) Goal status: IN PROGRESS    LONG TERM GOALS: Target Date: 01/25/23  Pt will improve functional ability by decreased impairment per Trinna Balloon assessment from 1/6 to 2/6 or better, for better quality of life. Goal status:  IN PROGRESS  2.  He will show better functional use of left arm by improved box and blocks Test score from 5 blocks to at least 10 blocks. Baseline: 8 blocks on 11/16/22 Goal status: IN PROGRESS  3. Pt will don a flannel button front shirt consistently with min A and min v.c Baseline: mod A with mod vc Goal status:  IN PROGRESS  4.  Pt will use his LUE as an active assist for ADLS/leisure tasks grossly 40% of the time with min v.c Baseline: uses 25% Goal status:  IN PROGRESS  5.  Pt will open the newspaper and locate sports and used car section with no more than min v.c                           Goal status:   IN  PROGRESS  6.  Pt will be able to get up/down from toilet with supervision and no cues.  Goal status: IN PROGRESS  7.  Pt will demonstrate improved sustained attention to complete breakfast in <1 hour  Baseline: currently requiring ~2 hours to complete eating breakfast Goal status: IN PROGRESS  ASSESSMENT:  CLINICAL IMPRESSION: Pt's adult children are providing physical assistance and supervision for pt while his spouse is out of town.  Pt's son reports that pt took a nap earlier in the day today which then delayed some of his medications.  Pt demonstrating increased distractibility this session, however of note session performed in larger treatment space with other pt's as opposed to quieter treatment space.  Pt continues to benefit from multimodal cues for setup and sequencing of sit > stand, pt benefiting from one -two word cues for setup to allow for increased attention and recall. Discussed placing visual target on floor 2-3 feet in front of pt's primary chair to allow for visual focus during sit > stand.  OT Frequency/DURATION: 2x week x 8 weeks   PLANNED INTERVENTIONS: self care/ADL training, therapeutic exercise, therapeutic activity, neuromuscular re-education, manual therapy, balance training, functional mobility training, moist heat, cryotherapy, patient/family education, cognitive remediation/compensation, visual/perceptual remediation/compensation, psychosocial skills training, energy conservation, coping strategies training, DME and/or AE instructions, and Re-evaluation  RECOMMENDED OTHER SERVICES: He has already  seen PT for balance and functional mobility, he would probably benefit from speech for cognitive issues as well, though OT will be working with cognition somewhat functionally now.  CONSULTED AND AGREED WITH PLAN OF CARE: Patient and family member/caregiver  PLAN FOR NEXT SESSION:  Continue on as appropriate, try to draw attention to tasks and Lt side more, focus on  decreased burden of care and incorporating motivating factors to facilitate increased attention to task.  Focus on UB dressing and sit > stand to decrease burden of care on spouse.    Maytte Jacot, OTR/L 4:08 PM 01/13/23       OCCUPATIONAL THERAPY DISCHARGE SUMMARY  Visits from Start of Care: 42  Current functional level related to goals / functional outcomes: Unsure as pt did not return for additional visits after 01/13/23.  Per discussion with PT, plan to return to [facility] due to proximity to home to continue to work with PT for skilled maintenance therapy due to pt's significant PMH and co-morbidities, including Parkinson's disease, hx of CVA and L sided weakness, as well as major neurocognitive disorder; benefiting from skill of PT to help patient and wife perform ongoing exercise and mobility to prevent rapid and significant decline in functional mobility and strength.   Remaining deficits: Balance, coordination, attention/cognition   Education / Equipment: Shoulder ROM, large amplitude, modifications to setup/positioning/routine for increased attention   Patient agrees to discharge. Patient goals were not met. Patient is being discharged due to not returning since the last visit.[name]  Rosalio Loud, OT 04/26/23

## 2023-01-17 ENCOUNTER — Emergency Department (HOSPITAL_COMMUNITY): Payer: PPO

## 2023-01-17 ENCOUNTER — Other Ambulatory Visit: Payer: Self-pay

## 2023-01-17 ENCOUNTER — Encounter: Payer: Self-pay | Admitting: Physical Therapy

## 2023-01-17 ENCOUNTER — Ambulatory Visit: Payer: PPO | Admitting: Physical Therapy

## 2023-01-17 ENCOUNTER — Emergency Department (HOSPITAL_COMMUNITY)
Admission: EM | Admit: 2023-01-17 | Discharge: 2023-01-17 | Disposition: A | Discharge status: Home / Self Care | Payer: PPO | Attending: Emergency Medicine | Admitting: Emergency Medicine

## 2023-01-17 DIAGNOSIS — Z7902 Long term (current) use of antithrombotics/antiplatelets: Secondary | ICD-10-CM | POA: Diagnosis not present

## 2023-01-17 DIAGNOSIS — K5730 Diverticulosis of large intestine without perforation or abscess without bleeding: Secondary | ICD-10-CM | POA: Diagnosis not present

## 2023-01-17 DIAGNOSIS — M6281 Muscle weakness (generalized): Secondary | ICD-10-CM | POA: Diagnosis not present

## 2023-01-17 DIAGNOSIS — I10 Essential (primary) hypertension: Secondary | ICD-10-CM | POA: Insufficient documentation

## 2023-01-17 DIAGNOSIS — R61 Generalized hyperhidrosis: Secondary | ICD-10-CM | POA: Diagnosis not present

## 2023-01-17 DIAGNOSIS — N400 Enlarged prostate without lower urinary tract symptoms: Secondary | ICD-10-CM | POA: Diagnosis not present

## 2023-01-17 DIAGNOSIS — Z79899 Other long term (current) drug therapy: Secondary | ICD-10-CM | POA: Diagnosis not present

## 2023-01-17 DIAGNOSIS — R911 Solitary pulmonary nodule: Secondary | ICD-10-CM | POA: Diagnosis not present

## 2023-01-17 DIAGNOSIS — Z91040 Latex allergy status: Secondary | ICD-10-CM | POA: Insufficient documentation

## 2023-01-17 DIAGNOSIS — R109 Unspecified abdominal pain: Secondary | ICD-10-CM | POA: Diagnosis not present

## 2023-01-17 DIAGNOSIS — N281 Cyst of kidney, acquired: Secondary | ICD-10-CM | POA: Diagnosis not present

## 2023-01-17 DIAGNOSIS — K5900 Constipation, unspecified: Secondary | ICD-10-CM | POA: Diagnosis not present

## 2023-01-17 DIAGNOSIS — R1084 Generalized abdominal pain: Secondary | ICD-10-CM | POA: Diagnosis not present

## 2023-01-17 DIAGNOSIS — R2689 Other abnormalities of gait and mobility: Secondary | ICD-10-CM

## 2023-01-17 LAB — LIPASE, BLOOD: Lipase: 40 U/L (ref 11–51)

## 2023-01-17 LAB — COMPREHENSIVE METABOLIC PANEL
ALT: 6 U/L (ref 0–44)
AST: 13 U/L — ABNORMAL LOW (ref 15–41)
Albumin: 3.9 g/dL (ref 3.5–5.0)
Alkaline Phosphatase: 144 U/L — ABNORMAL HIGH (ref 38–126)
Anion gap: 9 (ref 5–15)
BUN: 34 mg/dL — ABNORMAL HIGH (ref 8–23)
CO2: 26 mmol/L (ref 22–32)
Calcium: 9.1 mg/dL (ref 8.9–10.3)
Chloride: 106 mmol/L (ref 98–111)
Creatinine, Ser: 1.94 mg/dL — ABNORMAL HIGH (ref 0.61–1.24)
GFR, Estimated: 34 mL/min — ABNORMAL LOW (ref >60)
Glucose, Bld: 147 mg/dL — ABNORMAL HIGH (ref 70–99)
Potassium: 4.9 mmol/L (ref 3.5–5.1)
Sodium: 141 mmol/L (ref 135–145)
Total Bilirubin: 0.2 mg/dL — ABNORMAL LOW (ref 0.3–1.2)
Total Protein: 7.4 g/dL (ref 6.5–8.1)

## 2023-01-17 LAB — CBC WITH DIFFERENTIAL/PLATELET
Abs Immature Granulocytes: 0.04 10*3/uL (ref 0.00–0.07)
Basophils Absolute: 0 10*3/uL (ref 0.0–0.1)
Basophils Relative: 0 %
Eosinophils Absolute: 0.1 10*3/uL (ref 0.0–0.5)
Eosinophils Relative: 1 %
HCT: 38.5 % — ABNORMAL LOW (ref 39.0–52.0)
Hemoglobin: 12.6 g/dL — ABNORMAL LOW (ref 13.0–17.0)
Immature Granulocytes: 0 %
Lymphocytes Relative: 9 %
Lymphs Abs: 1 10*3/uL (ref 0.7–4.0)
MCH: 32.8 pg (ref 26.0–34.0)
MCHC: 32.7 g/dL (ref 30.0–36.0)
MCV: 100.3 fL — ABNORMAL HIGH (ref 80.0–100.0)
Monocytes Absolute: 0.7 10*3/uL (ref 0.1–1.0)
Monocytes Relative: 7 %
Neutro Abs: 8.8 10*3/uL — ABNORMAL HIGH (ref 1.7–7.7)
Neutrophils Relative %: 83 %
Platelets: 201 10*3/uL (ref 150–400)
RBC: 3.84 MIL/uL — ABNORMAL LOW (ref 4.22–5.81)
RDW: 13.2 % (ref 11.5–15.5)
WBC: 10.6 10*3/uL — ABNORMAL HIGH (ref 4.0–10.5)
nRBC: 0 % (ref 0.0–0.2)

## 2023-01-17 LAB — TROPONIN I (HIGH SENSITIVITY): Troponin I (High Sensitivity): 7 ng/L (ref <18)

## 2023-01-17 MED ORDER — SODIUM CHLORIDE 0.9 % IV BOLUS
1000.0000 mL | Freq: Once | INTRAVENOUS | Status: AC
  Administered 2023-01-17: 1000 mL via INTRAVENOUS

## 2023-01-17 MED ORDER — IOHEXOL 300 MG/ML  SOLN
80.0000 mL | Freq: Once | INTRAMUSCULAR | Status: AC | PRN
  Administered 2023-01-17: 80 mL via INTRAVENOUS

## 2023-01-17 NOTE — Discharge Instructions (Addendum)
As we discussed, your CT scan is unremarkable.  You were slightly dehydrated and you received IV fluids  You have pulmonary nodules that can be follow-up with your doctor  You are constipated so please continue taking your MiraLAX  See your doctor for follow-up  Return to ER if you have another episode of diaphoresis, chest pain or shortness of breath or abdominal pain.

## 2023-01-17 NOTE — ED Triage Notes (Signed)
Pt arrives from physical therapy via [facility] for reports of pt being diaphoretic during rehab. Pt has been recently constipated, took a large BM at PT, and now feels great.

## 2023-01-17 NOTE — Therapy (Signed)
OUTPATIENT PHYSICAL THERAPY NEURO TREATMENT NOTE   Patient Name: William [name] MRN: [id number] DOB:[birth year], 82 y.o., male Today's Date: 01/17/2023  PCP: Donato Schultz  DO REFERRING PROVIDER: same    END OF SESSION:  PT End of Session - 01/17/23 1447     Visit Number 52    Number of Visits 54    Date for PT Re-Evaluation 01/22/23    Authorization Type HTA    Progress Note Due on Visit 60    PT Start Time 1447    PT Stop Time 1540   some time spent in restroom   PT Time Calculation (min) 53 min    Equipment Utilized During Treatment Gait belt    Activity Tolerance Patient limited by lethargy;Treatment limited secondary to medical complications (Comment)   constipated and needs to have BM; gets sweaty/diaphoretic   Behavior During Therapy Flat affect                  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   HTN (hypertension) 07/19/2015   Hyperlipidemia    Major neurocognitive disorder 01/09/2014   Mild, related to stroke history   Nocturia    Renal insufficiency 06/25/2013   S/P carotid endarterectomy    BILATERAL ICA--  PATENT PER DUPLEX  05-19-2012   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   Right calf   Urinary frequency    Vitamin D deficiency    Past Surgical History:  Procedure Laterality Date   APPENDECTOMY  AS CHILD   CARDIOVASCULAR STRESS TEST  03-27-2012  DR CRENSHAW   LOW RISK LEXISCAN STUDY-- PROBABLE NORMAL PERFUSION AND SOFT TISSUE ATTENUATION/  NO ISCHEMIA/ EF 51%   CAROTID ENDARTERECTOMY Bilateral LEFT   11-12-2008  DR GREG HAYES   RIGHT ICA  2006  ([facility])   CHOLECYSTECTOMY N/A 02/23/2022   Procedure: LAPAROSCOPIC CHOLECYSTECTOMY;  Surgeon: Quentin Ore, MD;  Location: [facility] OR;  Service: General;  Laterality: N/A;   CYSTOSCOPY W/ RETROGRADES Bilateral 06/22/2021   Procedure: CYSTOSCOPY WITH RETROGRADE PYELOGRAM;  Surgeon: Marcine Matar, MD;  Location: [facility];  Service: Urology;  Laterality: Bilateral;   CYSTOSCOPY WITH LITHOLAPAXY N/A 02/26/2013   Procedure: CYSTOSCOPY WITH LITHOLAPAXY;  Surgeon: Marcine Matar, MD;  Location: [facility];  Service: Urology;  Laterality: N/A;   ENDOSCOPIC RETROGRADE CHOLANGIOPANCREATOGRAPHY (ERCP) WITH PROPOFOL N/A 02/22/2022   Procedure: ENDOSCOPIC RETROGRADE CHOLANGIOPANCREATOGRAPHY (ERCP) WITH PROPOFOL;  Surgeon: Jeani Hawking, MD;  Location: [facility];  Service: Gastroenterology;  Laterality: N/A;   EYE SURGERY  Jan. 2016   cataract surgery both eyes   INGUINAL HERNIA REPAIR Right 11-08-2006   IR KYPHO EA ADDL LEVEL THORACIC OR LUMBAR  02/12/2021   IR RADIOLOGIST EVAL & MGMT  02/18/2021   MASS EXCISION N/A 03/03/2016   Procedure: EXCISION OF BACK  MASS;  Surgeon: Almond Lint, MD;  Location: [facility];  Service: General;  Laterality: N/A;   MOHS SURGERY Left 1/ 2016   Dr  Hall-- Basal cell   PROSTATE SURGERY     REMOVAL OF STONES  02/22/2022   Procedure: REMOVAL OF STONES;  Surgeon: Jeani Hawking, MD;  Location: [facility] ENDOSCOPY;  Service: Gastroenterology;;   Dennison Mascot  02/22/2022   Procedure: Dennison Mascot;  Surgeon: Jeani Hawking, MD;  Location: [facility];  Service: Gastroenterology;;   TRANSURETHRAL RESECTION OF BLADDER TUMOR WITH MITOMYCIN-C N/A 06/22/2021   Procedure: TRANSURETHRAL RESECTION OF BLADDER TUMOR;  Surgeon: Marcine Matar, MD;  Location: [facility];  Service: Urology;  Laterality: N/A;   TRANSURETHRAL RESECTION OF PROSTATE N/A 02/26/2013   Procedure:  TRANSURETHRAL RESECTION OF THE PROSTATE WITH GYRUS INSTRUMENTS;  Surgeon: Marcine Matar, MD;  Location: [facility];  Service: Urology;  Laterality: N/A;   TRANSURETHRAL RESECTION OF PROSTATE N/A 06/22/2021   Procedure: TRANSURETHRAL RESECTION OF THE PROSTATE (TURP);  Surgeon: Marcine Matar, MD;  Location: [facility];  Service: Urology;  Laterality: N/A;   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   Insomnia    Prediabetes    Acute 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 (HCC)    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   Transaminitis 07/27/2021   UTI (urinary tract infection) 07/27/2021   CVA (cerebral vascular accident) (HCC) 07/27/2021   Fall 07/27/2021   Hyperglycemia 07/27/2021   Cholelithiasis 07/27/2021   Hypoxia 07/27/2021   Nausea and vomiting 07/27/2021   Acute metabolic encephalopathy 07/27/2021   Normocytic anemia 07/27/2021   Chronic back pain 07/27/2021   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   Closed fracture of first lumbar vertebra with routine healing 02/03/2021   Closed fracture of multiple ribs 11/18/2020   Anxiety 01/29/2020   Leg pain, bilateral 01/29/2020   Ingrown toenail 07/13/2019   Lumbar spondylosis 05/02/2018   Pain in left knee 03/09/2018   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    Trochanteric bursitis of left hip 01/16/2018   Preventative health care 09/26/2017   HTN (hypertension) 07/19/2015   Hyperlipidemia 07/19/2015   Great toe pain 02/11/2014   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   Obesity (BMI 30-39.9) 06/25/2013   Renal insufficiency 06/25/2013   Weakness of left arm 06/25/2013   Sebaceous cyst 03/03/2011   Sprain of lumbar region 07/31/2010   Rib pain, left 08/29/2009   Carotid artery stenosis, asymptomatic, bilateral 05/02/2009   Eczema, atopic 05/31/2008   Vitamin D deficiency 03/01/2008   BPH (benign prostatic hyperplasia) 08/06/2007   Fasting hyperglycemia 12/21/2006   History of right MCA infarct 2006    ONSET DATE: 06/29/22 REFERRING DIAG:  Diagnosis  I63.9 (ICD-10-CM) - Cerebrovascular accident (CVA), unspecified mechanism (HCC)  W19.XXXD (ICD-10-CM) - Fall, subsequent encounter    THERAPY DIAG:  Muscle weakness (generalized)  Other abnormalities of gait and mobility  Rationale for Evaluation and Treatment: Rehabilitation  SUBJECTIVE:  SUBJECTIVE STATEMENT:  No other falls or pain today.  Wife reports she notes improvement in strength and transfers at home.  She does feel she wants to continue with therapy, as he needs to be able to get up from the floor and to stay active.  PERTINENT HISTORY:  DANDREW HUBLER is a 82 year old man with dementia, CKD, HTN, CAD, HLD and history of CVA  (2006, 2023), Parkinson's  PAIN:  Are you having pain? No  PRECAUTIONS: Fall  WEIGHT BEARING RESTRICTIONS: No  FALLS: Has patient fallen in last 6 months? No  LIVING ENVIRONMENT: Lives with: lives with their family and lives with their spouse Lives in: House/apartment Stairs:  1 brick high step Has following equipment at home: Environmental consultant - 2 wheeled,  Wheelchair (manual), Shower bench, bed side commode, Grab bars, and hospital bed, sliding pad for car  PLOF: Independent with basic ADLs and Independent with household mobility without device  PATIENT GOALS: Walk around the block with his wife. Be able to use the dining room chair rather than have to use the W/C. Increased I with bed mobility and simple tasks at home like make some coffee or brush his teeth, Step over tub to shower, has bench, but he can't really use it. Up and down the one step to get to back deck. 11/23/22: Patient's wife William [name] updated his goals today: Walk around store (gets distracted), swing a golf club again (thinking of driving range), get up on his own, walk without leaning forward, more recognition of the left side (from CVA), not need gait belt, OT-- be able to get dressed more on his own, navigate the newspaper.  OBJECTIVE:    TODAY'S TREATMENT: 01/17/2023 Activity Comments  NU-step level 4 x 8 min, 4 extremities Tactile and verbal cues to increased stride length of Nustep to avoid hastening/smaller steps  Trial of U-step RW, 50 ft x 2 reps with min assist Used laser line and Vcs for increased step length, but pt tends to push walker too far in front; PT provides cues for hand placement and to try to stay closer to walker; one episode of feet crossing over with turn, very unsafe and too far out of BOS of U-step  Pt began not feeling well and needed to have BM: Sit<>stand and Stand pivot transfer mat>w/c and w/c<>toilet with min assist                      PATIENT EDUCATION: Education details: Initial discussion with wife about POC and therapy Person educated: Patient and Spouse Education method: Explanation, Demonstration, Tactile cues, and Verbal cues Education comprehension: verbalized understanding, returned demonstration, verbal cues required, tactile cues required, and needs further education    ------------------------------------------------------------------------------------------------------- (Measures in this section from initial evaluation unless otherwise noted) DIAGNOSTIC FINDINGS:  MRI 2021 with degenerative changes in lumbar spine, L3-4 R subarticular stenosis COGNITION: Overall cognitive status: History of cognitive impairments - at baseline SENSATION: Not tested COORDINATION: Moderately impaired BLE MUSCLE LENGTH: Hamstrings: severely restricted B Thomas test: Severely restricted B. POSTURE: rounded shoulders, forward head, increased thoracic kyphosis, posterior pelvic tilt, and flexed trunk  LOWER EXTREMITY ROM:   BLE extremely stiff throughout, limited hip ROM in all planes, B knees limited in extension. LOWER EXTREMITY MMT:  3+/5 throughout. Unable to determine accuracy of MMT due to cognition. Functionally demosntrates poor coordinated activation and poor muscular endurance. BED MOBILITY: Min A for Supine<> Sit. Patient was using a ladder in his hospital bed  and could move MI. TRANSFERS: Min A, mod VC, tends to lean back.  11/02/22 independent with use of hands on arm chair with a couple of tries CURB: Min A-Has one step out to his deck. STAIRS: N/A  GAIT: Gait pattern: step to pattern, decreased arm swing- Right, decreased arm swing- Left, decreased step length- Right, decreased step length- Left, shuffling, festinating, trunk flexed, narrow BOS, poor foot clearance- Right, and poor foot clearance- Left Distance walked: 36' Assistive device utilized: Environmental consultant - 2 wheeled Level of assistance: Occasional min A Comments: mod VC to stay close to walker, take longer steps. FUNCTIONAL TESTS:  5 times sit to stand: 38.23  09/29/22  could not performs 5XSTS, 11/02/22 = 47 seconds with cues to use arms Timed up and go (TUG): 47.91  with RW, 07/21/22 TUG 29  seconds without device, 09/29/22 25 seconds, 11/02/22 TUG 20 seconds                                                                             PATIENT EDUCATION: Education details: Progress towards goals, POC Person educated: Patient and Spouse Education method: Programmer, multimedia, Facilities manager, and Verbal cues  Education comprehension: verbalized understanding, returned demonstration, verbal cues required, and needs further education  HOME EXERCISE PROGRAM: TBD  GOALS: Goals reviewed with patient? Yes  SHORT TERM GOALS: Target date: 12/22/22  Perform HEP for PWR! Moves with cues from family. Baseline: Pt's wife, William [name], notes walking and hitting beach ball as main HEP. Goal status: GOAL MET, 12/20/2022   2. Decrease 5 x STS to < 35 sec to demonstrate improved functional strength  Baseline: 47 seconds on 5/21 per note; 34.5 sec UE support 12/20/2022  Goal status: GOAL MET, 12/20/2022   3. Perform sit <> stand transfers from average chair height with Supervision.  Baseline: CGA  12/20/2022; (01/10/23) CGA  Goal status: NOT MET  LONG TERM GOALS: Target date: 01/22/23  Patient will perform HEP progression with caregiver assist. Baseline: has minimal HEP Goal status: REVISED  2.  Decrease 5 x STS to < 25 sec to demonstrate improved functional strength Baseline: 47 seconds on 5/21 per note; (01/10/23) 36 sec w/ cues for speed/sequence Goal status: REVISED  3. Improve quality of gait to be able to ambulate x 100 feet with supervision. Baseline: CGA with cues  Goal status: REVISED  4.  Patient will perform his bed mobility with MI including rolling, sit<>supine, and scooting in bed. Baseline: on hard surface mat he is independent but on soft bed at home he struggles, wife reports only needs a hand to get up now 11/02/22; (01/10/23) modified indep.  Goal status: MET  5.  Patient will demonstrate floor<>stand with CGA and verbal cues for patient's wife to be able to assist. Baseline:  Has to call 9-1-1 Goal status: IN PROGRESS  ASSESSMENT:  CLINICAL IMPRESSION:  Initiated skilled PT session today with aerobic  activity and gait training trial with U-step RW.  With U-step walker, pt needs max cues to stay close to BOS of walker; utilized laser line; however, pt is distracted and does not look at line for more than several steps.  He is very unsafe with turn using U-step, and needs therapist assist  to reposition.  Discussed with pt/wife this is likely not safe for patient to use without significant assistance at this time.  Was planning to trial floor>stand transfer; however, pt had the need for BM and was assisted in bathroom process, along with wife's assist.    OBJECTIVE IMPAIRMENTS: Abnormal gait, decreased activity tolerance, decreased balance, decreased cognition, decreased coordination, decreased endurance, decreased mobility, difficulty walking, decreased ROM, decreased strength, decreased safety awareness, impaired flexibility, impaired UE functional use, improper body mechanics, and postural dysfunction.   PLAN:  PT FREQUENCY: 2x/week  PT DURATION: 12 weeks  PLANNED INTERVENTIONS: Therapeutic exercises, Therapeutic activity, Neuromuscular re-education, Balance training, Gait training, Patient/Family education, Self Care, Joint mobilization, Stair training, Moist heat, and Manual therapy  PLAN FOR NEXT SESSION:   Try U-step again if in clinc. (Would ACT potentially be an option for continued community fitness?- can talk about this when she sees him next).  Need to check goals and discuss POC.  Gean Maidens., PT 01/17/2023, 5:20 PM  [facility] [address], [address] [place], [place], [address] Phone: [phone]   Fax:  [phone]

## 2023-01-17 NOTE — ED Provider Notes (Signed)
[place] EMERGENCY DEPARTMENT AT [facility] Provider Note   CSN: [id number] Arrival date & time: 01/17/23  1616     History  Chief Complaint  Patient presents with   Constipation    William [name] is a 82 y.o. male history of cholecystectomy, BPH, hypertension, here presenting with abdominal pain and diaphoresis.  Patient is at physical therapy and then had sudden onset of diaphoresis.  Patient states that he was constipated and had a large bowel movement.  Now he is feeling better.  Physical therapy sent patient over for further evaluation.  Denies any chest pain.  The history is provided by the patient.       Home Medications Prior to Admission medications   Medication Sig Start Date End Date Taking? Authorizing Provider  acetaminophen (TYLENOL) 325 MG tablet Take 2 tablets (650 mg total) by mouth every 6 (six) hours as needed for mild pain. 02/24/22   Eric Form, PA-C  amantadine (SYMMETREL) 100 MG capsule Take 1 capsule (100 mg total) by mouth daily. 09/28/22   Donato Schultz, DO  amLODipine (NORVASC) 10 MG tablet Take 1 tablet (10 mg total) by mouth every morning. 01/03/23   [name], [name], DO  carBAMazepine (TEGRETOL) 100 MG/5ML suspension Take 5 mLs (100 mg total) by mouth in the morning and in the afternoon, and then 10 mLs (200 mg total) at bedtime. 10/26/22   Plovsky, Earvin Hansen, MD  carbidopa-levodopa (SINEMET IR) 25-100 MG tablet Take 1/2 tablet twice daily for one week, then 1/2 tablet three times daily for one week, then 1 tablet three times daily. 11/09/22   Drema Dallas, DO  Cholecalciferol (VITAMIN D3) 25 MCG (1000 UT) CHEW Chew 1 tablet by mouth daily.    [provider]  clobetasol cream (TEMOVATE) 0.05 % Apply 1 Application to affected areas topically up to 2 (two) times daily as needed. Do not apply to face, groin, or under arms 11/02/22   Nita Sells, MD  clopidogrel (PLAVIX) 75 MG tablet Take 1 tablet (75 mg total) by mouth  daily. 09/13/22   Donato Schultz, DO  clotrimazole-betamethasone (LOTRISONE) cream Apply 1 Application on to the skin daily. 12/10/21   Seabron Spates R, DO  ferrous sulfate 325 (65 FE) MG EC tablet Take 325 mg by mouth 2 (two) times daily. *Crushed*    [provider]  fluticasone (CUTIVATE) 0.05 % cream 1 application  daily as needed (psoriasis). 12/16/16   [provider]  fluticasone (FLONASE) 50 MCG/ACT nasal spray Place 1 spray into both nostrils daily. 12/12/22   Radford Pax, NP  hydrALAZINE (APRESOLINE) 25 MG tablet Take 1 tablet (25 mg total) by mouth every 8 (eight) hours. 01/03/23 07/05/23  Donato Schultz, DO  LORazepam (ATIVAN) 0.5 MG tablet Take 0.5 tablet (0.25 mg total) by mouth in the morning, then 1 tablet (0.5 mg total) by mouth in the afternoon, and the 2 tablets (2 mg total) by mouth at bedtime. 10/26/22   Plovsky, Earvin Hansen, MD  Multiple Vitamins-Minerals (ADULT ONE DAILY GUMMIES) CHEW Chew 1 tablet by mouth every morning.    [provider]  ofloxacin (FLOXIN) 0.3 % OTIC solution Place 10 drops into both ears daily. 12/21/22   Donato Schultz, DO  pantoprazole (PROTONIX) 40 MG tablet Take 1 tablet (40 mg total) by mouth daily. 11/15/22   Seabron Spates R, DO  polyethylene glycol (MIRALAX / GLYCOLAX) 17 g packet Take 17  g by mouth daily. 10/02/21   Pricilla Loveless, MD  tamsulosin (FLOMAX) 0.4 MG CAPS capsule Take 1 capsule (0.4 mg total) by mouth daily after supper. 10/18/22   Donato Schultz, DO  tiZANidine (ZANAFLEX) 4 MG tablet Take 1 tablet (4 mg total) by mouth every 6 (six) hours as needed for muscle spasms. 07/01/22   Donato Schultz, DO  traZODone (DESYREL) 150 MG tablet Take 0.5 tablets (75 mg total) by mouth at bedtime. *Crushed* 10/26/22   Plovsky, Earvin Hansen, MD  triamcinolone cream (KENALOG) 0.1 % Apply 1 application topically 2 (two) times daily. 11/15/22   Donato Schultz, DO      Allergies    Bee venom,  Strawberry extract, Latex, Zetia [ezetimibe], Adhesive [tape], and Statins    Review of Systems   Review of Systems  Gastrointestinal:  Positive for constipation.  All other systems reviewed and are negative.   Physical Exam Updated Vital Signs There were no vitals taken for this visit. Physical Exam Vitals and nursing note reviewed.  Constitutional:      Appearance: Normal appearance.  HENT:     Head: Normocephalic.     Nose: Nose normal.     Mouth/Throat:     Mouth: Mucous membranes are dry.  Eyes:     Extraocular Movements: Extraocular movements intact.     Pupils: Pupils are equal, round, and reactive to light.  Cardiovascular:     Rate and Rhythm: Normal rate and regular rhythm.     Pulses: Normal pulses.     Heart sounds: Normal heart sounds.  Pulmonary:     Effort: Pulmonary effort is normal.     Breath sounds: Normal breath sounds.  Abdominal:     General: Abdomen is flat.     Palpations: Abdomen is soft.     Comments: Soft and nontender  Musculoskeletal:        General: Normal range of motion.     Cervical back: Normal range of motion and neck supple.  Skin:    General: Skin is warm.     Capillary Refill: Capillary refill takes less than 2 seconds.  Neurological:     General: No focal deficit present.     Mental Status: He is alert and oriented to person, place, and time.  Psychiatric:        Mood and Affect: Mood normal.        Behavior: Behavior normal.     ED Results / Procedures / Treatments   Labs (all labs ordered are listed, but only abnormal results are displayed) Labs Reviewed  CBC WITH DIFFERENTIAL/PLATELET  COMPREHENSIVE METABOLIC PANEL  LIPASE, BLOOD  TROPONIN I (HIGH SENSITIVITY)    EKG None  Radiology No results found.  Procedures Procedures    Medications Ordered in ED Medications  sodium chloride 0.9 % bolus 1,000 mL (has no administration in time range)    ED Course/ Medical Decision Making/ A&P                                  Medical Decision Making William [name] is a 82 y.o. male here presenting with abdominal pain and diaphoresis.  Patient likely has a vasovagal event.  Plan to get CBC and CMP and CT abdomen pelvis.  Will hydrate and reassess.   7:03 PM I reviewed patient's labs and independently interpreted imaging studies.  Patient's creatinine is 1.9 and baseline is  1.6.  Patient received IV fluids.  CT abdomen pelvis was unremarkable.  Patient does have incidental pulmonary nodules.  Patient is stable for discharge.  Problems Addressed: Constipation, unspecified constipation type: chronic illness or injury Diaphoresis: acute illness or injury Pulmonary nodule: undiagnosed new problem with uncertain prognosis  Amount and/or Complexity of Data Reviewed Labs: ordered. Decision-making details documented in ED Course. Radiology: ordered and independent interpretation performed. Decision-making details documented in ED Course. ECG/medicine tests: ordered and independent interpretation performed. Decision-making details documented in ED Course.  Risk Prescription drug management.    Final Clinical Impression(s) / ED Diagnoses Final diagnoses:  None    Rx / DC Orders ED Discharge Orders     None         Charlynne Pander, MD 01/17/23 Ebony Cargo

## 2023-01-19 ENCOUNTER — Ambulatory Visit: Payer: PPO | Admitting: Physical Therapy

## 2023-01-19 ENCOUNTER — Encounter: Payer: Self-pay | Admitting: Physical Therapy

## 2023-01-19 DIAGNOSIS — R2689 Other abnormalities of gait and mobility: Secondary | ICD-10-CM

## 2023-01-19 DIAGNOSIS — M6281 Muscle weakness (generalized): Secondary | ICD-10-CM | POA: Diagnosis not present

## 2023-01-19 NOTE — Therapy (Signed)
OUTPATIENT PHYSICAL THERAPY NEURO TREATMENT NOTE   Patient Name: William [name] MRN: [id number] DOB:Aug 11, 1940, 82 y.o., male Today's Date: 01/19/2023  PCP: Donato Schultz  DO REFERRING PROVIDER: same    END OF SESSION:  PT End of Session - 01/19/23 1449     Visit Number 53    Number of Visits 54    Date for PT Re-Evaluation 01/22/23    Authorization Type HTA    Progress Note Due on Visit 60    PT Start Time 1450    PT Stop Time 1533    PT Time Calculation (min) 43 min    Equipment Utilized During Treatment Gait belt    Activity Tolerance Patient tolerated treatment well    Behavior During Therapy WFL for tasks assessed/performed                   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   HTN (hypertension) 07/19/2015   Hyperlipidemia    Major neurocognitive disorder 01/09/2014   Mild, related to stroke history   Nocturia    Renal insufficiency 06/25/2013   S/P carotid endarterectomy    BILATERAL ICA--  PATENT PER DUPLEX  05-19-2012   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   Right calf   Urinary frequency    Vitamin D deficiency    Past Surgical History:  Procedure Laterality Date   APPENDECTOMY  AS CHILD   CARDIOVASCULAR STRESS TEST  03-27-2012  DR CRENSHAW   LOW RISK LEXISCAN STUDY-- PROBABLE NORMAL PERFUSION AND SOFT TISSUE ATTENUATION/  NO ISCHEMIA/ EF 51%   CAROTID ENDARTERECTOMY Bilateral LEFT  11-12-2008  DR GREG HAYES   RIGHT ICA  2006  ([facility])   CHOLECYSTECTOMY N/A 02/23/2022   Procedure: LAPAROSCOPIC  CHOLECYSTECTOMY;  Surgeon: Quentin Ore, MD;  Location: [facility] OR;  Service: General;  Laterality: N/A;   CYSTOSCOPY W/ RETROGRADES Bilateral 06/22/2021   Procedure: CYSTOSCOPY WITH RETROGRADE PYELOGRAM;  Surgeon: Marcine Matar, MD;  Location: [facility];  Service: Urology;  Laterality: Bilateral;   CYSTOSCOPY WITH LITHOLAPAXY N/A 02/26/2013   Procedure: CYSTOSCOPY WITH LITHOLAPAXY;  Surgeon: Marcine Matar, MD;  Location: [facility];  Service: Urology;  Laterality: N/A;   ENDOSCOPIC RETROGRADE CHOLANGIOPANCREATOGRAPHY (ERCP) WITH PROPOFOL N/A 02/22/2022   Procedure: ENDOSCOPIC RETROGRADE CHOLANGIOPANCREATOGRAPHY (ERCP) WITH PROPOFOL;  Surgeon: Jeani Hawking, MD;  Location: [facility] ENDOSCOPY;  Service: Gastroenterology;  Laterality: N/A;   EYE SURGERY  Jan. 2016   cataract surgery both eyes   INGUINAL HERNIA REPAIR Right 11-08-2006   IR KYPHO EA ADDL LEVEL THORACIC OR LUMBAR  02/12/2021   IR RADIOLOGIST EVAL & MGMT  02/18/2021   MASS EXCISION N/A 03/03/2016   Procedure: EXCISION OF BACK  MASS;  Surgeon: Almond Lint, MD;  Location: [facility];  Service: General;  Laterality: N/A;   MOHS SURGERY Left 1/ 2016   Dr Margo Aye-- Basal cell   PROSTATE SURGERY     REMOVAL OF STONES  02/22/2022  Procedure: REMOVAL OF STONES;  Surgeon: Jeani Hawking, MD;  Location: [facility] ENDOSCOPY;  Service: Gastroenterology;;   Dennison Mascot  02/22/2022   Procedure: Dennison Mascot;  Surgeon: Jeani Hawking, MD;  Location: [facility] ENDOSCOPY;  Service: Gastroenterology;;   TRANSURETHRAL RESECTION OF BLADDER TUMOR WITH MITOMYCIN-C N/A 06/22/2021   Procedure: TRANSURETHRAL RESECTION OF BLADDER TUMOR;  Surgeon: Marcine Matar, MD;  Location: [facility];  Service: Urology;  Laterality: N/A;   TRANSURETHRAL RESECTION OF PROSTATE N/A 02/26/2013   Procedure: TRANSURETHRAL RESECTION OF THE PROSTATE WITH GYRUS INSTRUMENTS;  Surgeon: Marcine Matar, MD;  Location: [facility];  Service: Urology;  Laterality: N/A;   TRANSURETHRAL RESECTION OF PROSTATE N/A 06/22/2021   Procedure: TRANSURETHRAL RESECTION OF THE PROSTATE (TURP);  Surgeon: Marcine Matar, MD;  Location: [facility];  Service: Urology;  Laterality: N/A;   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   Insomnia    Prediabetes    Acute 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 (HCC)    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   Transaminitis 07/27/2021   UTI (urinary tract infection) 07/27/2021   CVA (cerebral vascular accident) (HCC) 07/27/2021   Fall 07/27/2021   Hyperglycemia 07/27/2021   Cholelithiasis 07/27/2021   Hypoxia 07/27/2021   Nausea and vomiting 07/27/2021   Acute metabolic encephalopathy 07/27/2021   Normocytic anemia 07/27/2021   Chronic back pain 07/27/2021   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   Closed fracture of first lumbar vertebra with routine healing 02/03/2021   Closed fracture of multiple ribs 11/18/2020   Anxiety 01/29/2020   Leg pain, bilateral 01/29/2020   Ingrown toenail 07/13/2019   Lumbar spondylosis 05/02/2018   Pain in left knee 03/09/2018   Osteoarthritis of left hip 01/16/2018   Trochanteric bursitis of left hip 01/16/2018   Preventative health care 09/26/2017   HTN (hypertension) 07/19/2015    Hyperlipidemia 07/19/2015   Great toe pain 02/11/2014   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   Obesity (BMI 30-39.9) 06/25/2013   Renal insufficiency 06/25/2013   Weakness of left arm 06/25/2013   Sebaceous cyst 03/03/2011   Sprain of lumbar region 07/31/2010   Rib pain, left 08/29/2009   Carotid artery stenosis, asymptomatic, bilateral 05/02/2009   Eczema, atopic 05/31/2008   Vitamin D deficiency 03/01/2008   BPH (benign prostatic hyperplasia) 08/06/2007   Fasting hyperglycemia 12/21/2006   History of right MCA infarct 2006    ONSET DATE: 06/29/22 REFERRING DIAG:  Diagnosis  I63.9 (ICD-10-CM) - Cerebrovascular accident (CVA), unspecified mechanism (HCC)  W19.XXXD (ICD-10-CM) - Fall, subsequent encounter    THERAPY DIAG:  Muscle weakness (generalized)  Other abnormalities of gait and mobility  Rationale for Evaluation and Treatment: Rehabilitation  SUBJECTIVE:  SUBJECTIVE STATEMENT:  Started at [facility] on Tuesday.  Will go there 2x/wk.  He was cleared at the ED Monday.  PERTINENT HISTORY:  William [name] is a 81 year old man with dementia, CKD, HTN, CAD, HLD and history of CVA  (2006, 2023), Parkinson's  PAIN:  Are you having pain? No  PRECAUTIONS: Fall  WEIGHT BEARING RESTRICTIONS: No  FALLS: Has patient fallen in last 6 months? No  LIVING ENVIRONMENT: Lives with: lives with their family and lives with their spouse Lives in: House/apartment Stairs:  1 brick high step Has following equipment at home: Environmental consultant - 2 wheeled, Wheelchair (manual), Shower bench, bed side commode, Grab bars, and hospital bed, sliding pad for car  PLOF: Independent with basic ADLs and Independent with household mobility without device  PATIENT GOALS: Walk around the block with his wife. Be  able to use the dining room chair rather than have to use the W/C. Increased I with bed mobility and simple tasks at home like make some coffee or brush his teeth, Step over tub to shower, has bench, but he can't really use it. Up and down the one step to get to back deck. 11/23/22: Patient's wife [name] updated his goals today: Walk around store (gets distracted), swing a golf club again (thinking of driving range), get up on his own, walk without leaning forward, more recognition of the left side (from CVA), not need gait belt, OT-- be able to get dressed more on his own, navigate the newspaper.  OBJECTIVE:    TODAY'S TREATMENT: 01/19/2023 Activity Comments  Floor transfer: Stand>tall kneeling with mod/max assist, then tall kneeling>sidesitting with +2 max assist  Side sit>tall kneeling with +2 max assist Tall kneel>1/2 kneel to turn to sit at mat, +2 assist Extra time and max cues provided throughout  Sit<>stand x 3 reps min guard Cues for hand placement  Gait x 75 ft x 3 reps, HHA and cues for increased step length   In parallel bars:  forward/back walking with cues for taking larger steps Pt initially takes 10 forward steps to end, and able to take 8 steps to end of bars with cues for larger steps  Forward step ups to 4" step, 10 reps LLE, 6 reps RLE RUE support        PATIENT EDUCATION: Education details: Discussion about POC and therapy-initiated discussion about transition to skilled maintenance therapy, as pt's condition continues to require skilled assistance with therapy activities to prevent decline in mobility; discussed also possibility of return to [facility] (as this is closer to pt's home), and discussed option for [facility]) Person educated: Patient and Spouse Education method: Explanation, Demonstration, Tactile cues, and Verbal cues Education comprehension: verbalized understanding, returned demonstration, verbal cues required, tactile cues required, and needs further  education   ------------------------------------------------------------------------------------------------------- (Measures in this section from initial evaluation unless otherwise noted) DIAGNOSTIC FINDINGS:  MRI 2021 with degenerative changes in lumbar spine, L3-4 R subarticular stenosis COGNITION: Overall cognitive status: History of cognitive impairments - at baseline SENSATION: Not tested COORDINATION: Moderately impaired BLE MUSCLE LENGTH: Hamstrings: severely restricted B Thomas test: Severely restricted B. POSTURE: rounded shoulders, forward head, increased thoracic kyphosis, posterior pelvic tilt, and flexed trunk  LOWER EXTREMITY ROM:   BLE extremely stiff throughout, limited hip ROM in all planes, B knees limited in extension. LOWER EXTREMITY MMT:  3+/5 throughout. Unable to determine accuracy of MMT due to cognition. Functionally demosntrates poor coordinated activation and poor muscular endurance. BED MOBILITY: Min A for  Supine<> Sit. Patient was using a ladder in his hospital bed and could move MI. TRANSFERS: Min A, mod VC, tends to lean back.  11/02/22 independent with use of hands on arm chair with a couple of tries CURB: Min A-Has one step out to his deck. STAIRS: N/A  GAIT: Gait pattern: step to pattern, decreased arm swing- Right, decreased arm swing- Left, decreased step length- Right, decreased step length- Left, shuffling, festinating, trunk flexed, narrow BOS, poor foot clearance- Right, and poor foot clearance- Left Distance walked: 52' Assistive device utilized: Environmental consultant - 2 wheeled Level of assistance: Occasional min A Comments: mod VC to stay close to walker, take longer steps. FUNCTIONAL TESTS:  5 times sit to stand: 38.23  09/29/22  could not performs 5XSTS, 11/02/22 = 47 seconds with cues to use arms Timed up and go (TUG): 47.91  with RW, 07/21/22 TUG 29  seconds without device, 09/29/22 25 seconds, 11/02/22 TUG 20 seconds                                                                             PATIENT EDUCATION: Education details: Progress towards goals, POC Person educated: Patient and Spouse Education method: Programmer, multimedia, Facilities manager, and Verbal cues  Education comprehension: verbalized understanding, returned demonstration, verbal cues required, and needs further education  HOME EXERCISE PROGRAM: TBD  GOALS: Goals reviewed with patient? Yes  SHORT TERM GOALS: Target date: 12/22/22  Perform HEP for PWR! Moves with cues from family. Baseline: Pt's wife, [name], notes walking and hitting beach ball as main HEP. Goal status: GOAL MET, 12/20/2022   2. Decrease 5 x STS to < 35 sec to demonstrate improved functional strength  Baseline: 47 seconds on 5/21 per note; 34.5 sec UE support 12/20/2022  Goal status: GOAL MET, 12/20/2022   3. Perform sit <> stand transfers from average chair height with Supervision.  Baseline: CGA  12/20/2022; (01/10/23) CGA  Goal status: NOT MET  LONG TERM GOALS: Target date: 01/22/23  Patient will perform HEP progression with caregiver assist. Baseline: has minimal HEP Goal status: REVISED  2.  Decrease 5 x STS to < 25 sec to demonstrate improved functional strength Baseline: 47 seconds on 5/21 per note; (01/10/23) 36 sec w/ cues for speed/sequence Goal status: REVISED  3. Improve quality of gait to be able to ambulate x 100 feet with supervision. Baseline: CGA with cues  Goal status: REVISED  4.  Patient will perform his bed mobility with MI including rolling, sit<>supine, and scooting in bed. Baseline: on hard surface mat he is independent but on soft bed at home he struggles, wife reports only needs a hand to get up now 11/02/22; (01/10/23) modified indep.  Goal status: MET  5.  Patient will demonstrate floor<>stand with CGA and verbal cues for patient's wife to be able to assist. Baseline:  Has to call 9-1-1; +2 max assist floor>stand with UE support 01/19/2023 Goal status: IN  PROGRESS  ASSESSMENT:  CLINICAL IMPRESSION:  Worked initially today on floor<>stand transfer, with pt needing extra time and +2 max assistance to complete at edge of mat.  Discussed that wife would likely continue to call 911 if pt had fallen and needs assistance getting up from floor.  Worked on gait activities today, with pt performing gait well with HHA and cues for increased step length.  He continues to need max cues through gait and balance activities for large amplitude movement patterns.  OBJECTIVE IMPAIRMENTS: Abnormal gait, decreased activity tolerance, decreased balance, decreased cognition, decreased coordination, decreased endurance, decreased mobility, difficulty walking, decreased ROM, decreased strength, decreased safety awareness, impaired flexibility, impaired UE functional use, improper body mechanics, and postural dysfunction.   PLAN:  PT FREQUENCY: 2x/week  PT DURATION: 12 weeks  PLANNED INTERVENTIONS: Therapeutic exercises, Therapeutic activity, Neuromuscular re-education, Balance training, Gait training, Patient/Family education, Self Care, Joint mobilization, Stair training, Moist heat, and Manual therapy  PLAN FOR NEXT SESSION:   Check remaining LTGs.  Discuss POC and options   , W., PT 01/19/2023, 5:27 PM  [facility] [address] [place], [place], [address] Phone: [phone]   Fax:  [phone]

## 2023-01-20 ENCOUNTER — Ambulatory Visit: Payer: PPO | Admitting: Occupational Therapy

## 2023-01-24 ENCOUNTER — Other Ambulatory Visit: Payer: Self-pay

## 2023-01-24 ENCOUNTER — Encounter: Payer: Self-pay | Admitting: Physical Therapy

## 2023-01-24 ENCOUNTER — Ambulatory Visit: Payer: PPO | Admitting: Physical Therapy

## 2023-01-24 ENCOUNTER — Ambulatory Visit: Payer: PPO | Admitting: Occupational Therapy

## 2023-01-24 DIAGNOSIS — M6281 Muscle weakness (generalized): Secondary | ICD-10-CM

## 2023-01-24 DIAGNOSIS — R2689 Other abnormalities of gait and mobility: Secondary | ICD-10-CM

## 2023-01-24 NOTE — Therapy (Signed)
OUTPATIENT PHYSICAL THERAPY NEURO TREATMENT NOTE/RECERT  Patient Name: William [name] MRN: [id number] DOB:1940/11/25, 82 y.o., male Today's Date: 01/24/2023  PCP: Donato Schultz  DO REFERRING PROVIDER: same    END OF SESSION:  PT End of Session - 01/24/23 1530     Visit Number 54    Number of Visits 66    Date for PT Re-Evaluation 04/22/23    Authorization Type HTA    Progress Note Due on Visit 60    PT Start Time 1532    PT Stop Time 1615    PT Time Calculation (min) 43 min    Equipment Utilized During Treatment Gait belt    Activity Tolerance Patient tolerated treatment well    Behavior During Therapy WFL for tasks assessed/performed                    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   HTN (hypertension) 07/19/2015   Hyperlipidemia    Major neurocognitive disorder 01/09/2014   Mild, related to stroke history   Nocturia    Renal insufficiency 06/25/2013   S/P carotid endarterectomy    BILATERAL ICA--  PATENT PER DUPLEX  05-19-2012   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   Right calf   Urinary frequency    Vitamin D deficiency    Past Surgical History:  Procedure Laterality Date   APPENDECTOMY  AS CHILD   CARDIOVASCULAR STRESS TEST  03-27-2012  DR CRENSHAW   LOW RISK LEXISCAN STUDY-- PROBABLE NORMAL PERFUSION AND SOFT TISSUE ATTENUATION/  NO ISCHEMIA/ EF 51%   CAROTID ENDARTERECTOMY Bilateral LEFT  11-12-2008  DR GREG HAYES   RIGHT ICA  2006  ([facility])   CHOLECYSTECTOMY N/A 02/23/2022   Procedure: LAPAROSCOPIC  CHOLECYSTECTOMY;  Surgeon: Quentin Ore, MD;  Location: [facility];  Service: General;  Laterality: N/A;   CYSTOSCOPY W/ RETROGRADES Bilateral 06/22/2021   Procedure: CYSTOSCOPY WITH RETROGRADE PYELOGRAM;  Surgeon: Marcine Matar, MD;  Location: [facility];  Service: Urology;  Laterality: Bilateral;   CYSTOSCOPY WITH LITHOLAPAXY N/A 02/26/2013   Procedure: CYSTOSCOPY WITH LITHOLAPAXY;  Surgeon: Marcine Matar, MD;  Location: [facility];  Service: Urology;  Laterality: N/A;   ENDOSCOPIC RETROGRADE CHOLANGIOPANCREATOGRAPHY (ERCP) WITH PROPOFOL N/A 02/22/2022   Procedure: ENDOSCOPIC RETROGRADE CHOLANGIOPANCREATOGRAPHY (ERCP) WITH PROPOFOL;  Surgeon: Jeani Hawking, MD;  Location: [facility] ENDOSCOPY;  Service: Gastroenterology;  Laterality: N/A;   EYE SURGERY  Jan. 2016   cataract surgery both eyes   INGUINAL HERNIA REPAIR Right 11-08-2006   IR KYPHO EA ADDL LEVEL THORACIC OR LUMBAR  02/12/2021   IR RADIOLOGIST EVAL & MGMT  02/18/2021   MASS EXCISION N/A 03/03/2016   Procedure: EXCISION OF BACK  MASS;  Surgeon: Almond Lint, MD;  Location: [facility];  Service: General;  Laterality: N/A;   MOHS SURGERY Left 1/ 2016   Dr Margo Aye-- Basal cell   PROSTATE SURGERY     REMOVAL OF STONES  02/22/2022  Procedure: REMOVAL OF STONES;  Surgeon: Jeani Hawking, MD;  Location: [facility] ENDOSCOPY;  Service: Gastroenterology;;   Dennison Mascot  02/22/2022   Procedure: Dennison Mascot;  Surgeon: Jeani Hawking, MD;  Location: [facility] ENDOSCOPY;  Service: Gastroenterology;;   TRANSURETHRAL RESECTION OF BLADDER TUMOR WITH MITOMYCIN-C N/A 06/22/2021   Procedure: TRANSURETHRAL RESECTION OF BLADDER TUMOR;  Surgeon: Marcine Matar, MD;  Location: [facility];  Service: Urology;  Laterality: N/A;   TRANSURETHRAL RESECTION OF PROSTATE N/A 02/26/2013   Procedure: TRANSURETHRAL RESECTION OF THE PROSTATE WITH GYRUS INSTRUMENTS;  Surgeon: Marcine Matar, MD;  Location: [facility];  Service: Urology;  Laterality: N/A;   TRANSURETHRAL RESECTION OF PROSTATE N/A 06/22/2021   Procedure: TRANSURETHRAL RESECTION OF THE PROSTATE (TURP);  Surgeon: Marcine Matar, MD;  Location: [facility];  Service: Urology;  Laterality: N/A;   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   Insomnia    Prediabetes    Acute 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 (HCC)    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   Transaminitis 07/27/2021   UTI (urinary tract infection) 07/27/2021   CVA (cerebral vascular accident) (HCC) 07/27/2021   Fall 07/27/2021   Hyperglycemia 07/27/2021   Cholelithiasis 07/27/2021   Hypoxia 07/27/2021   Nausea and vomiting 07/27/2021   Acute metabolic encephalopathy 07/27/2021   Normocytic anemia 07/27/2021   Chronic back pain 07/27/2021   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   Closed fracture of first lumbar vertebra with routine healing 02/03/2021   Closed fracture of multiple ribs 11/18/2020   Anxiety 01/29/2020   Leg pain, bilateral 01/29/2020   Ingrown toenail 07/13/2019   Lumbar spondylosis 05/02/2018   Pain in left knee 03/09/2018   Osteoarthritis of left hip 01/16/2018   Trochanteric bursitis of left hip 01/16/2018   Preventative health care 09/26/2017   HTN (hypertension) 07/19/2015    Hyperlipidemia 07/19/2015   Great toe pain 02/11/2014   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   Obesity (BMI 30-39.9) 06/25/2013   Renal insufficiency 06/25/2013   Weakness of left arm 06/25/2013   Sebaceous cyst 03/03/2011   Sprain of lumbar region 07/31/2010   Rib pain, left 08/29/2009   Carotid artery stenosis, asymptomatic, bilateral 05/02/2009   Eczema, atopic 05/31/2008   Vitamin D deficiency 03/01/2008   BPH (benign prostatic hyperplasia) 08/06/2007   Fasting hyperglycemia 12/21/2006   History of right MCA infarct 2006    ONSET DATE: 06/29/22 REFERRING DIAG:  Diagnosis  I63.9 (ICD-10-CM) - Cerebrovascular accident (CVA), unspecified mechanism (HCC)  W19.XXXD (ICD-10-CM) - Fall, subsequent encounter    THERAPY DIAG:  Muscle weakness (generalized)  Other abnormalities of gait and mobility  Rationale for Evaluation and Treatment: Rehabilitation  SUBJECTIVE:  SUBJECTIVE STATEMENT:  [facility] activities have gone well.  Interested in returning to [employer] and possibly [facility].  PERTINENT HISTORY:  HAVIS MCSWEENEY is a 82 year old man with dementia, CKD, HTN, CAD, HLD and history of CVA  (2006, 2023), Parkinson's  PAIN:  Are you having pain? No  PRECAUTIONS: Fall  WEIGHT BEARING RESTRICTIONS: No  FALLS: Has patient fallen in last 6 months? No  LIVING ENVIRONMENT: Lives with: lives with their family and lives with their spouse Lives in: House/apartment Stairs:  1 brick high step Has following equipment at home: Environmental consultant - 2 wheeled, Wheelchair (manual), Shower bench, bed side commode, Grab bars, and hospital bed, sliding pad for car  PLOF: Independent with basic ADLs and Independent with household mobility without device  PATIENT GOALS: Walk around the block with his wife.  Be able to use the dining room chair rather than have to use the W/C. Increased I with bed mobility and simple tasks at home like make some coffee or brush his teeth, Step over tub to shower, has bench, but he can't really use it. Up and down the one step to get to back deck. 11/23/22: Patient's wife [name] updated his goals today: Walk around store (gets distracted), swing a golf club again (thinking of driving range), get up on his own, walk without leaning forward, more recognition of the left side (from CVA), not need gait belt, OT-- be able to get dressed more on his own, navigate the newspaper.  OBJECTIVE:    TODAY'S TREATMENT: 01/24/2023 Activity Comments  NuStep, Level 4, 4 extremities x 7 minutes Cue for long strides  5x sit<>stand:  min assist and cues for use of BUE support 41.75 sec, 43.94 sec, 30.38  sec  2 min of gait:  150 ft with min guard Cues for foot clearance  Seated exercises using [employer]ers: Forward lean to upright posture Forward lean to V-posture Lateral weightshift and reach to target at shoulder level, R and L, 2 x 5 reps Lateral weightshift and reach to target at floor, L 2 x 5 reps Visual, tactile cues for increased amplitude of LUE movement, but good response to external targets  In parallel bars:  forward/back walking with cues for taking larger steps Pt initially takes >10 forward steps to end, and able to take 8 steps to end of bars with cues for larger steps       PATIENT EDUCATION: Education details: Provided information for [facility] and [employer] return to 1x/wk for skilled maintenance therapy to continue exercises to prevent loss of function and functional mobility decline without access to exercise Person educated: Patient and Spouse Education method: Explanation, Demonstration, Tactile cues, and Verbal cues Education comprehension: verbalized understanding, returned demonstration, verbal cues required, tactile cues required, and needs further education    ------------------------------------------------------------------------------------------------------- (Measures in this section from initial evaluation unless otherwise noted) DIAGNOSTIC FINDINGS:  MRI 2021 with degenerative changes in lumbar spine, L3-4 R subarticular stenosis COGNITION: Overall cognitive status: History of cognitive impairments - at baseline SENSATION: Not tested COORDINATION: Moderately impaired BLE MUSCLE LENGTH: Hamstrings: severely restricted B Thomas test: Severely restricted B. POSTURE: rounded shoulders, forward head, increased thoracic kyphosis, posterior pelvic tilt, and flexed trunk  LOWER EXTREMITY ROM:   BLE extremely stiff throughout, limited hip ROM in all planes, B knees limited in extension. LOWER EXTREMITY MMT:  3+/5 throughout. Unable to determine accuracy of MMT due to cognition. Functionally demosntrates poor coordinated activation and poor muscular endurance. BED MOBILITY: Min A  for Supine<> Sit. Patient was using a ladder in his hospital bed and could move MI. TRANSFERS: Min A, mod VC, tends to lean back.  11/02/22 independent with use of hands on arm chair with a couple of tries CURB: Min A-Has one step out to his deck. STAIRS: N/A  GAIT: Gait pattern: step to pattern, decreased arm swing- Right, decreased arm swing- Left, decreased step length- Right, decreased step length- Left, shuffling, festinating, trunk flexed, narrow BOS, poor foot clearance- Right, and poor foot clearance- Left Distance walked: 41' Assistive device utilized: Environmental consultant - 2 wheeled Level of assistance: Occasional min A Comments: mod VC to stay close to walker, take longer steps. FUNCTIONAL TESTS:  5 times sit to stand: 38.23  09/29/22  could not performs 5XSTS, 11/02/22 = 47 seconds with cues to use arms Timed up and go (TUG): 47.91  with RW, 07/21/22 TUG 29  seconds without device, 09/29/22 25 seconds, 11/02/22 TUG 20 seconds                                                                             PATIENT EDUCATION: Education details: Progress towards goals, POC Person educated: Patient and Spouse Education method: Programmer, multimedia, Facilities manager, and Verbal cues  Education comprehension: verbalized understanding, returned demonstration, verbal cues required, and needs further education  HOME EXERCISE PROGRAM: TBD  GOALS: Goals reviewed with patient? Yes  SHORT TERM GOALS: Target date: 12/22/22  Perform HEP for PWR! Moves with cues from family. Baseline: Pt's wife, [name], notes walking and hitting beach ball as main HEP. Goal status: GOAL MET, 12/20/2022   2. Decrease 5 x STS to < 35 sec to demonstrate improved functional strength  Baseline: 47 seconds on 5/21 per note; 34.5 sec UE support 12/20/2022  Goal status: GOAL MET, 12/20/2022   3. Perform sit <> stand transfers from average chair height with Supervision.  Baseline: CGA  12/20/2022; (01/10/23) CGA  Goal status: NOT MET  LONG TERM GOALS: Target date: 01/22/23  Patient will perform HEP progression with caregiver assist. Baseline: has minimal HEP Goal status: ONGOING  2.  Decrease 5 x STS to < 25 sec to demonstrate improved functional strength Baseline: 47 seconds on 5/21 per note; (01/10/23) 36 sec w/ cues for speed/sequence>30.38 sec with BUE supprot Goal status: PARTIALLY MET, 01/24/2023  3. Improve quality of gait to be able to ambulate x 100 feet with supervision. Baseline: CGA with cues  Goal status: REVISED  4.  Patient will perform his bed mobility with MI including rolling, sit<>supine, and scooting in bed. Baseline: on hard surface mat he is independent but on soft bed at home he struggles, wife reports only needs a hand to get up now 11/02/22; (01/10/23) modified indep.  Goal status: MET  5.  Patient will demonstrate floor<>stand with CGA and verbal cues for patient's wife to be able to assist. Baseline:  Has to call 9-1-1; +2 max assist floor>stand with UE support 01/19/2023 Goal status: GOAL  NOT MET 01/19/2023 **UPDATED GOALS BELOW ** per recert 01/24/2023  SHORT TERM GOALS: Target date: 02/25/2023   Pt will perform HEP to maintain lower body strength, flexibility, posture with wife's assistance. Baseline:  Goal status: INITIAL   2. Pt  will perform sit to stand, 8 of 10 reps with BUE support, with supervision, to maintain safety with transfers.  Baseline: min guard  Goal status: INITIAL   LONG TERM GOALS: Target date: 04/22/2023   Patient will perform HEP progression to maintain lower body strength, flexibility, posture with wife's assistance. Baseline:  Goal status: INITIAL  2.  Pt will ambulate 150-175 ft in 2 MWT with min guard assist, to preserve functional mobility within the home.  Baseline: Goal status:  INITIAL  3. Wife will verbalize tips for fall prevention education to minimize fall frequency Baseline:  Goal status: INITIAL   ASSESSMENT:  CLINICAL IMPRESSION:  Assessed LTGs this visit, with pt meeting 1 and 4.  LTG 2, 3, and 5 not met or not fully met.  Pt needs max cues throughout activities in therapy session for hand placement for sit<>stand, and for increased step length/foot clearance with gait.  Pt needs consistent min guard (sometimes min assist) with transfers and gait.  Pt has significant PMH and co-morbidities, including Parkinson's disease, hx of CVA and L sided weakness, as well as major neurocognitive disorder; these limit pt's independent carryover with PT exercises and require max cueing for optimal performance of exercises and mobility in therapy sessions.  Pt requires skill of PT to help patient and wife perform ongoing exercise and mobility to prevent rapid and significant decline in functional mobility and strength (which would increase caregiver burden and further increase pt's risk of falls); therefore, continued skilled maintenance therapy is recommended for this patient.    OBJECTIVE IMPAIRMENTS: Abnormal gait, decreased activity tolerance,  decreased balance, decreased cognition, decreased coordination, decreased endurance, decreased mobility, difficulty walking, decreased ROM, decreased strength, decreased safety awareness, impaired flexibility, impaired UE functional use, improper body mechanics, and postural dysfunction.   PLAN:  PT FREQUENCY: 1x/week  PT DURATION: 12 weeks  PLANNED INTERVENTIONS: Therapeutic exercises, Therapeutic activity, Neuromuscular re-education, Balance training, Gait training, Patient/Family education, Self Care, Joint mobilization, Stair training, Moist heat, and Manual therapy  PLAN FOR NEXT SESSION:   Continue exercises for lower body strength, sit<>Stand and walking for exercise.  Check about [facility] as part of his routine.   , W., PT 01/24/2023, 5:13 PM  [facility] [address] [place], [place], [address] Phone: [phone]   Fax:  [phone]

## 2023-01-25 ENCOUNTER — Telehealth: Payer: Self-pay

## 2023-01-25 ENCOUNTER — Other Ambulatory Visit (HOSPITAL_BASED_OUTPATIENT_CLINIC_OR_DEPARTMENT_OTHER): Payer: Self-pay

## 2023-01-25 ENCOUNTER — Ambulatory Visit: Payer: PPO | Admitting: Physical Therapy

## 2023-01-25 NOTE — Telephone Encounter (Signed)
Transition Care Management Follow-up Telephone Call Date of discharge and from where: 01/17/2023 [facility] How have you been since you were released from the hospital? Patient is feeling much better. Any questions or concerns? No  Items Reviewed: Did the pt receive and understand the discharge instructions provided? Yes  Medications obtained and verified?  No medication prescribed. Other? No  Any new allergies since your discharge? No  Dietary orders reviewed? Yes Do you have support at home? Yes   Follow up appointments reviewed:  PCP Hospital f/u appt confirmed? No  Scheduled to see  on  @ . Specialist Hospital f/u appt confirmed? Yes  Scheduled to see Gean Maidens, PT on 01/19/2023 @ [facility]. Are transportation arrangements needed? No  If their condition worsens, is the pt aware to call PCP or go to the Emergency Dept.? Yes Was the patient provided with contact information for the PCP's office or ED? Yes Was to pt encouraged to call back with questions or concerns? Yes   [facility]  [facility] Population Health Community Resource Care Guide   ??[email or web address]  ?? [phone]   Website: [email or web address]  [email or web address]

## 2023-02-04 ENCOUNTER — Other Ambulatory Visit: Payer: Self-pay | Admitting: Family Medicine

## 2023-02-04 DIAGNOSIS — F0390 Unspecified dementia without behavioral disturbance: Secondary | ICD-10-CM

## 2023-02-04 DIAGNOSIS — G20A1 Parkinson's disease without dyskinesia, without mention of fluctuations: Secondary | ICD-10-CM

## 2023-02-04 DIAGNOSIS — I63511 Cerebral infarction due to unspecified occlusion or stenosis of right middle cerebral artery: Secondary | ICD-10-CM

## 2023-02-04 NOTE — Telephone Encounter (Signed)
Please disregard

## 2023-02-07 ENCOUNTER — Other Ambulatory Visit (HOSPITAL_BASED_OUTPATIENT_CLINIC_OR_DEPARTMENT_OTHER): Payer: Self-pay

## 2023-02-07 ENCOUNTER — Ambulatory Visit: Payer: PPO | Attending: Family Medicine | Admitting: Physical Therapy

## 2023-02-07 ENCOUNTER — Other Ambulatory Visit (HOSPITAL_COMMUNITY): Payer: Self-pay | Admitting: Psychiatry

## 2023-02-07 ENCOUNTER — Telehealth: Payer: Self-pay | Admitting: *Deleted

## 2023-02-07 ENCOUNTER — Encounter: Payer: Self-pay | Admitting: Physical Therapy

## 2023-02-07 DIAGNOSIS — G20A1 Parkinson's disease without dyskinesia, without mention of fluctuations: Secondary | ICD-10-CM | POA: Insufficient documentation

## 2023-02-07 DIAGNOSIS — R41840 Attention and concentration deficit: Secondary | ICD-10-CM | POA: Insufficient documentation

## 2023-02-07 DIAGNOSIS — R262 Difficulty in walking, not elsewhere classified: Secondary | ICD-10-CM | POA: Insufficient documentation

## 2023-02-07 DIAGNOSIS — Z9181 History of falling: Secondary | ICD-10-CM | POA: Diagnosis not present

## 2023-02-07 DIAGNOSIS — R2689 Other abnormalities of gait and mobility: Secondary | ICD-10-CM | POA: Insufficient documentation

## 2023-02-07 DIAGNOSIS — R278 Other lack of coordination: Secondary | ICD-10-CM | POA: Diagnosis not present

## 2023-02-07 DIAGNOSIS — M6281 Muscle weakness (generalized): Secondary | ICD-10-CM | POA: Insufficient documentation

## 2023-02-07 DIAGNOSIS — I69354 Hemiplegia and hemiparesis following cerebral infarction affecting left non-dominant side: Secondary | ICD-10-CM | POA: Insufficient documentation

## 2023-02-07 NOTE — Therapy (Deleted)
OUTPATIENT PHYSICAL THERAPY LOWER EXTREMITY EVALUATION   Patient Name: William [name] MRN: [id number] DOB:1941-03-04, 82 y.o., male Today's Date: 02/07/2023  END OF SESSION:   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   HTN (hypertension) 07/19/2015   Hyperlipidemia    Major neurocognitive disorder 01/09/2014   Mild, related to stroke history   Nocturia    Renal insufficiency 06/25/2013   S/P carotid endarterectomy    BILATERAL ICA--  PATENT PER DUPLEX  05-19-2012   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   Right calf   Urinary frequency    Vitamin D deficiency    Past Surgical History:  Procedure Laterality Date   APPENDECTOMY  AS CHILD   CARDIOVASCULAR STRESS TEST  03-27-2012  DR CRENSHAW   LOW RISK LEXISCAN STUDY-- PROBABLE NORMAL PERFUSION AND SOFT TISSUE ATTENUATION/  NO ISCHEMIA/ EF 51%   CAROTID ENDARTERECTOMY Bilateral LEFT  11-12-2008  DR GREG HAYES   RIGHT ICA  2006  ([facility])   CHOLECYSTECTOMY N/A 02/23/2022   Procedure: LAPAROSCOPIC CHOLECYSTECTOMY;  Surgeon: Quentin Ore, MD;  Location: [facility] OR;  Service: General;  Laterality: N/A;   CYSTOSCOPY W/ RETROGRADES Bilateral 06/22/2021   Procedure: CYSTOSCOPY WITH RETROGRADE PYELOGRAM;  Surgeon: Marcine Matar, MD;  Location: [facility];  Service: Urology;  Laterality: Bilateral;   CYSTOSCOPY WITH LITHOLAPAXY N/A 02/26/2013   Procedure: CYSTOSCOPY WITH LITHOLAPAXY;  Surgeon: Marcine Matar, MD;  Location: [facility];  Service: Urology;  Laterality: N/A;    ENDOSCOPIC RETROGRADE CHOLANGIOPANCREATOGRAPHY (ERCP) WITH PROPOFOL N/A 02/22/2022   Procedure: ENDOSCOPIC RETROGRADE CHOLANGIOPANCREATOGRAPHY (ERCP) WITH PROPOFOL;  Surgeon: Jeani Hawking, MD;  Location: [facility] ENDOSCOPY;  Service: Gastroenterology;  Laterality: N/A;   EYE SURGERY  Jan. 2016   cataract surgery both eyes   INGUINAL HERNIA REPAIR Right 11-08-2006   IR KYPHO EA ADDL LEVEL THORACIC OR LUMBAR  02/12/2021   IR RADIOLOGIST EVAL & MGMT  02/18/2021   MASS EXCISION N/A 03/03/2016   Procedure: EXCISION OF BACK  MASS;  Surgeon: Almond Lint, MD;  Location: [facility];  Service: General;  Laterality: N/A;   MOHS SURGERY Left 1/ 2016   Dr Margo Aye-- Basal cell   PROSTATE SURGERY     REMOVAL OF STONES  02/22/2022   Procedure: REMOVAL OF STONES;  Surgeon: Jeani Hawking, MD;  Location: [facility] ENDOSCOPY;  Service: Gastroenterology;;   Dennison Mascot  02/22/2022   Procedure: Dennison Mascot;  Surgeon: Jeani Hawking, MD;  Location: [facility] ENDOSCOPY;  Service: Gastroenterology;;   TRANSURETHRAL RESECTION OF BLADDER TUMOR WITH MITOMYCIN-C N/A 06/22/2021   Procedure: TRANSURETHRAL RESECTION OF BLADDER TUMOR;  Surgeon: Marcine Matar, MD;  Location: [facility];  Service: Urology;  Laterality: N/A;   TRANSURETHRAL RESECTION OF PROSTATE N/A 02/26/2013   Procedure: TRANSURETHRAL RESECTION OF THE PROSTATE WITH GYRUS INSTRUMENTS;  Surgeon: Marcine Matar, MD;  Location: [facility];  Service: Urology;  Laterality: N/A;   TRANSURETHRAL RESECTION OF PROSTATE N/A 06/22/2021   Procedure: TRANSURETHRAL RESECTION OF THE PROSTATE (TURP);  Surgeon: Marcine Matar, MD;  Location: [facility];  Service: Urology;  Laterality: N/A;   Patient Active Problem List   Diagnosis Date Noted   Dysuria 08/03/2022   Nonintractable headache 07/01/2022   Bilateral impacted cerumen 06/24/2022   Rash 06/15/2022   Postoperative ileus (HCC) 02/27/2022   Ileus, postoperative (HCC)  02/26/2022   Choledocholithiasis 02/19/2022   DNR (do not resuscitate) 02/19/2022   Left elbow pain 01/26/2022   Mid back pain on left side 01/26/2022   Rib pain 01/26/2022   Acute pain of left shoulder 11/12/2021   Leukocytes in urine 11/12/2021   Urinary frequency 11/12/2021   Thrush 10/08/2021   Hemiplegia, 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   Insomnia    Prediabetes    Acute 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 (HCC)    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   Transaminitis 07/27/2021   UTI (urinary tract infection) 07/27/2021   CVA (cerebral vascular accident) (HCC) 07/27/2021   Fall 07/27/2021   Hyperglycemia 07/27/2021   Cholelithiasis 07/27/2021   Hypoxia 07/27/2021   Nausea and vomiting 07/27/2021   Acute metabolic encephalopathy 07/27/2021   Normocytic anemia 07/27/2021   Chronic back pain 07/27/2021   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   Closed fracture of first lumbar vertebra with routine healing 02/03/2021   Closed fracture of multiple ribs 11/18/2020   Anxiety 01/29/2020   Leg pain, bilateral 01/29/2020   Ingrown toenail 07/13/2019   Lumbar spondylosis 05/02/2018   Pain in left knee 03/09/2018   Osteoarthritis of left hip 01/16/2018   Trochanteric bursitis of left hip 01/16/2018   Preventative health care 09/26/2017   HTN (hypertension) 07/19/2015   Hyperlipidemia 07/19/2015   Great toe pain 02/11/2014   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   Obesity (BMI 30-39.9) 06/25/2013   Renal insufficiency 06/25/2013   Weakness of left arm 06/25/2013   Sebaceous cyst 03/03/2011   Sprain of lumbar region 07/31/2010   Rib pain, left 08/29/2009   Carotid artery stenosis, asymptomatic, bilateral 05/02/2009   Eczema, atopic 05/31/2008   Vitamin D deficiency 03/01/2008   BPH (benign prostatic hyperplasia) 08/06/2007   Fasting hyperglycemia 12/21/2006   History of  right MCA infarct 2006    PCP: lowne chase, yvonne r  REFERRING PROVIDER: lowne chase, yvonne r  REFERRING DIAG: G20.A1 (ICD-10-CM) - 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  Rationale for Evaluation and Treatment: Rehabilitation  ONSET DATE: 02/04/23  SUBJECTIVE:   SUBJECTIVE STATEMENT: ***  PERTINENT HISTORY: JASIYAH DIVELY is a 82 year old man with dementia, CKD, HTN, CAD, HLD and history of CVA  (2006, 2023), Parkinson's  PAIN:  Are you having pain? {OPRCPAIN:27236}  PRECAUTIONS: {Therapy precautions:24002}  RED FLAGS: {PT Red Flags:29287}   WEIGHT BEARING RESTRICTIONS: {Yes ***/No:24003}  FALLS:  Has patient fallen in last 6 months? {fallsyesno:27318}  LIVING ENVIRONMENT: Lives with: lives with their family Lives in: House/apartment Stairs: 1 brick step to enter Has following equipment at home: Environmental consultant - 2 wheeled, Wheelchair (manual), Shower bench, bed side commode, Grab bars, and hospital bed, sliding pad for car   OCCUPATION: N/A  PLOF: Independent with basic ADLs and Independent with household mobility without device   PATIENT GOALS: ***  NEXT MD VISIT: ***  OBJECTIVE:   DIAGNOSTIC FINDINGS: ***  PATIENT SURVEYS:  {rehab surveys:24030}  COGNITION: Overall cognitive status: {cognition:24006}     SENSATION: {sensation:27233}  EDEMA:  {  edema:24020}  MUSCLE LENGTH: Hamstrings: Right *** deg; Left *** deg Maisie Fus test: Right *** deg; Left *** deg  POSTURE: {posture:25561}  PALPATION: ***  LOWER EXTREMITY ROM:  {AROM/PROM:27142} ROM Right eval Left eval  Hip flexion    Hip extension    Hip abduction    Hip adduction    Hip internal rotation    Hip external rotation    Knee flexion    Knee extension    Ankle dorsiflexion    Ankle plantarflexion    Ankle inversion    Ankle eversion     (Blank rows = not tested)  LOWER EXTREMITY MMT:  MMT Right eval Left eval   Hip flexion    Hip extension    Hip abduction    Hip adduction    Hip internal rotation    Hip external rotation    Knee flexion    Knee extension    Ankle dorsiflexion    Ankle plantarflexion    Ankle inversion    Ankle eversion     (Blank rows = not tested)  LOWER EXTREMITY SPECIAL TESTS:  {LEspecialtests:26242}  FUNCTIONAL TESTS:  {Functional tests:24029}  GAIT: Distance walked: *** Assistive device utilized: {Assistive devices:23999} Level of assistance: {Levels of assistance:24026} Comments: ***   TODAY'S TREATMENT:                                                                                                                              DATE: ***    PATIENT EDUCATION:  Education details: *** Person educated: {Person educated:25204} Education method: {Education Method:25205} Education comprehension: {Education Comprehension:25206}  HOME EXERCISE PROGRAM: ***  ASSESSMENT:  CLINICAL IMPRESSION: Patient is a *** y.o. *** who was seen today for physical therapy evaluation and treatment for ***.   OBJECTIVE IMPAIRMENTS: {opptimpairments:25111}.   ACTIVITY LIMITATIONS: {activitylimitations:27494}  PARTICIPATION LIMITATIONS: {participationrestrictions:25113}  PERSONAL FACTORS: {Personal factors:25162} are also affecting patient's functional outcome.   REHAB POTENTIAL: {rehabpotential:25112}  CLINICAL DECISION MAKING: {clinical decision making:25114}  EVALUATION COMPLEXITY: {Evaluation complexity:25115}   GOALS: Goals reviewed with patient? {yes/no:20286}  SHORT TERM GOALS: Target date: *** *** Baseline: Goal status: INITIAL  2.  *** Baseline:  Goal status: INITIAL  3.  *** Baseline:  Goal status: INITIAL  4.  *** Baseline:  Goal status: INITIAL  5.  *** Baseline:  Goal status: INITIAL  6.  *** Baseline:  Goal status: INITIAL  LONG TERM GOALS: Target date: ***  *** Baseline:  Goal status: INITIAL  2.  *** Baseline:  Goal  status: INITIAL  3.  *** Baseline:  Goal status: INITIAL  4.  *** Baseline:  Goal status: INITIAL  5.  *** Baseline:  Goal status: INITIAL  6.  *** Baseline:  Goal status: INITIAL   PLAN:  PT FREQUENCY: {rehab frequency:25116}  PT DURATION: {rehab duration:25117}  PLANNED INTERVENTIONS: {rehab planned interventions:25118::"Therapeutic exercises","Therapeutic activity","Neuromuscular re-education","Balance training","Gait training","Patient/Family education","Self Care","Joint mobilization"}  PLAN FOR NEXT SESSION: ***   Georga Hacking  Chase Caller, DPT 02/07/2023, 2:49 PM

## 2023-02-07 NOTE — Progress Notes (Unsigned)
  Care Coordination  Outreach Note  02/07/2023 Name: William [name] MRN: [id number] DOB: Oct 08, 1940   Care Coordination Outreach Attempts: An unsuccessful telephone outreach was attempted today to offer the patient information about available care coordination services.  Follow Up Plan:  Additional outreach attempts will be made to offer the patient care coordination information and services.   Encounter Outcome:  No Answer  Burman Nieves, CCMA Care Coordination Care Guide Direct Dial: [phone]

## 2023-02-07 NOTE — Therapy (Signed)
OUTPATIENT PHYSICAL THERAPY NEURO TREATMENT NOTE/RECERT  Patient Name: William [name] MRN: [id number] DOB:Jan 19, 1941, 82 y.o., male Today's Date: 02/07/2023  PCP: Donato Schultz  DO REFERRING PROVIDER: same   END OF SESSION:  PT End of Session - 02/07/23 1718     Visit Number 55    Date for PT Re-Evaluation 04/22/23    PT Start Time 1634    PT Stop Time 1716    PT Time Calculation (min) 42 min    Equipment Utilized During Treatment Gait belt    Activity Tolerance Patient tolerated treatment well    Behavior During Therapy WFL for tasks assessed/performed            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   HTN (hypertension) 07/19/2015   Hyperlipidemia    Major neurocognitive disorder 01/09/2014   Mild, related to stroke history   Nocturia    Renal insufficiency 06/25/2013   S/P carotid endarterectomy    BILATERAL ICA--  PATENT PER DUPLEX  05-19-2012   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/26/2017   Right calf   Urinary frequency    Vitamin D deficiency    Past Surgical History:  Procedure Laterality Date   APPENDECTOMY  AS CHILD   CARDIOVASCULAR STRESS TEST  03-27-2012  DR CRENSHAW   LOW RISK LEXISCAN STUDY-- PROBABLE NORMAL PERFUSION AND SOFT TISSUE ATTENUATION/  NO ISCHEMIA/ EF 51%   CAROTID ENDARTERECTOMY Bilateral LEFT  11-12-2008  DR GREG HAYES   RIGHT ICA  2006  ([facility])   CHOLECYSTECTOMY N/A 02/23/2022   Procedure: LAPAROSCOPIC CHOLECYSTECTOMY;  Surgeon: Quentin Ore, MD;  Location: [facility] OR;  Service: General;  Laterality: N/A;    CYSTOSCOPY W/ RETROGRADES Bilateral 06/22/2021   Procedure: CYSTOSCOPY WITH RETROGRADE PYELOGRAM;  Surgeon: Marcine Matar, MD;  Location: [facility];  Service: Urology;  Laterality: Bilateral;   CYSTOSCOPY WITH LITHOLAPAXY N/A 02/26/2013   Procedure: CYSTOSCOPY WITH LITHOLAPAXY;  Surgeon: Marcine Matar, MD;  Location: [facility];  Service: Urology;  Laterality: N/A;   ENDOSCOPIC RETROGRADE CHOLANGIOPANCREATOGRAPHY (ERCP) WITH PROPOFOL N/A 02/22/2022   Procedure: ENDOSCOPIC RETROGRADE CHOLANGIOPANCREATOGRAPHY (ERCP) WITH PROPOFOL;  Surgeon: Jeani Hawking, MD;  Location: [facility] ENDOSCOPY;  Service: Gastroenterology;  Laterality: N/A;   EYE SURGERY  Jan. 2016   cataract surgery both eyes   INGUINAL HERNIA REPAIR Right 11-08-2006   IR KYPHO EA ADDL LEVEL THORACIC OR LUMBAR  02/12/2021   IR RADIOLOGIST EVAL & MGMT  02/18/2021   MASS EXCISION N/A 03/03/2016   Procedure: EXCISION OF BACK  MASS;  Surgeon: Almond Lint, MD;  Location: [facility];  Service: General;  Laterality: N/A;   MOHS SURGERY Left 1/ 2016   Dr Margo Aye-- Basal cell   PROSTATE SURGERY     REMOVAL OF STONES  02/22/2022   Procedure: REMOVAL OF STONES;  Surgeon: Jeani Hawking, MD;  Location: [facility] ENDOSCOPY;  Service: Gastroenterology;;   Dennison Mascot  02/22/2022   Procedure: Dennison Mascot;  Surgeon: Jeani Hawking, MD;  Location: [facility] ENDOSCOPY;  Service: Gastroenterology;;   TRANSURETHRAL RESECTION OF BLADDER TUMOR WITH MITOMYCIN-C N/A 06/22/2021   Procedure: TRANSURETHRAL RESECTION OF BLADDER TUMOR;  Surgeon: Marcine Matar, MD;  Location: [facility];  Service: Urology;  Laterality: N/A;   TRANSURETHRAL RESECTION OF PROSTATE N/A 02/26/2013   Procedure: TRANSURETHRAL RESECTION OF THE PROSTATE WITH GYRUS INSTRUMENTS;  Surgeon: Marcine Matar, MD;  Location: [facility];  Service: Urology;  Laterality: N/A;   TRANSURETHRAL RESECTION OF PROSTATE N/A 06/22/2021    Procedure: TRANSURETHRAL RESECTION OF THE PROSTATE (TURP);  Surgeon: Marcine Matar, MD;  Location: [facility];  Service: Urology;  Laterality: N/A;   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   Insomnia    Prediabetes    Acute 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 (HCC)    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   Transaminitis 07/27/2021   UTI (urinary tract infection) 07/27/2021   CVA (cerebral vascular accident) (HCC) 07/27/2021   Fall 07/27/2021   Hyperglycemia 07/27/2021   Cholelithiasis 07/27/2021   Hypoxia 07/27/2021   Nausea and vomiting 07/27/2021   Acute metabolic encephalopathy 07/27/2021   Normocytic anemia 07/27/2021   Chronic back pain 07/27/2021   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   Closed fracture of first lumbar vertebra with routine healing 02/03/2021   Closed fracture of multiple ribs 11/18/2020   Anxiety 01/29/2020   Leg pain, bilateral 01/29/2020   Ingrown toenail 07/13/2019   Lumbar spondylosis 05/02/2018   Pain in left knee 03/09/2018   Osteoarthritis of left hip 01/16/2018   Trochanteric bursitis of left hip 01/16/2018   Preventative health care 09/26/2017   HTN (hypertension) 07/19/2015   Hyperlipidemia 07/19/2015   Great toe pain 02/11/2014   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   Obesity  (BMI 30-39.9) 06/25/2013   Renal insufficiency 06/25/2013   Weakness of left arm 06/25/2013   Sebaceous cyst 03/03/2011   Sprain of lumbar region 07/31/2010   Rib pain, left 08/29/2009   Carotid artery stenosis, asymptomatic, bilateral 05/02/2009   Eczema, atopic 05/31/2008   Vitamin D deficiency 03/01/2008   BPH (benign prostatic hyperplasia) 08/06/2007   Fasting hyperglycemia 12/21/2006   History of right MCA infarct 2006    ONSET DATE: 06/29/22 REFERRING DIAG:  Diagnosis  I63.9 (ICD-10-CM) - Cerebrovascular accident (CVA), unspecified mechanism (HCC)  W19.XXXD (ICD-10-CM) - Fall, subsequent encounter    THERAPY DIAG:  Muscle weakness (generalized)  Other abnormalities of gait and mobility  Hemiplegia and hemiparesis following cerebral infarction affecting left non-dominant side (HCC)  History of falling  Difficulty in walking, not elsewhere classified  Other lack of coordination  Attention and concentration deficit  Rationale for Evaluation and Treatment: Rehabilitation  SUBJECTIVE:  SUBJECTIVE STATEMENT:  [facility] activities have gone well. May be interested in [facility] program  PERTINENT HISTORY:  William [name] is a 82 year old man with dementia, CKD, HTN, CAD, HLD and history of CVA  (2006, 2023), Parkinson's  PAIN:  Are you having pain? No  PRECAUTIONS: Fall  WEIGHT BEARING RESTRICTIONS: No  FALLS: Has patient fallen in last 6 months? No  LIVING ENVIRONMENT: Lives with: lives with their family and lives with their spouse Lives in: House/apartment Stairs:  1 brick high step Has following equipment at home: Environmental consultant - 2 wheeled, Wheelchair (manual), Shower bench, bed side commode, Grab bars, and hospital bed, sliding pad for car  PLOF: Independent with basic ADLs and  Independent with household mobility without device  PATIENT GOALS: Walk around the block with his wife. Be able to use the dining room chair rather than have to use the W/C. Increased I with bed mobility and simple tasks at home like make some coffee or brush his teeth, Step over tub to shower, has bench, but he can't really use it. Up and down the one step to get to back deck. 11/23/22: Patient's wife [name] updated his goals today: Walk around store (gets distracted), swing a golf club again (thinking of driving range), get up on his own, walk without leaning forward, more recognition of the left side (from CVA), not need gait belt, OT-- be able to get dressed more on his own, navigate the newspaper.  OBJECTIVE:    TODAY'S TREATMENT: 01/24/2023 Activity Comments  NuStep, Level 4, 4 extremities x 7 minutes Cue for long strides  5x sit<>stand:  min assist and cues for use of BUE support 41.75 sec, 43.94 sec, 30.38  sec  2 min of gait:  150 ft with min guard Cues for foot clearance  Seated exercises using [employer]ers: Forward lean to upright posture Forward lean to V-posture Lateral weightshift and reach to target at shoulder level, R and L, 2 x 5 reps Lateral weightshift and reach to target at floor, L 2 x 5 reps Visual, tactile cues for increased amplitude of LUE movement, but good response to external targets  In parallel bars:  forward/back walking with cues for taking larger steps Pt initially takes >10 forward steps to end, and able to take 8 steps to end of bars with cues for larger steps       PATIENT EDUCATION: Education details: Provided information for [facility] and [employer] return to 1x/wk for skilled maintenance therapy to continue exercises to prevent loss of function and functional mobility decline without access to exercise Person educated: Patient and Spouse Education method: Explanation, Demonstration, Tactile cues, and Verbal cues Education comprehension: verbalized  understanding, returned demonstration, verbal cues required, tactile cues required, and needs further education   ------------------------------------------------------------------------------------------------------- (Measures in this section from initial evaluation unless otherwise noted) DIAGNOSTIC FINDINGS:  MRI 2021 with degenerative changes in lumbar spine, L3-4 R subarticular stenosis COGNITION: Overall cognitive status: History of cognitive impairments - at baseline SENSATION: Not tested COORDINATION: Moderately impaired BLE MUSCLE LENGTH: Hamstrings: severely restricted B Thomas test: Severely restricted B. POSTURE: rounded shoulders, forward head, increased thoracic kyphosis, posterior pelvic tilt, and flexed trunk  LOWER EXTREMITY ROM:   BLE extremely stiff throughout, limited hip ROM in all planes, B knees limited in extension. LOWER EXTREMITY MMT:  3+/5 throughout. Unable to determine accuracy of MMT due to cognition. Functionally demosntrates poor coordinated activation and poor muscular endurance. BED MOBILITY: Min A for Supine<> Sit. Patient  was using a ladder in his hospital bed and could move MI. TRANSFERS: Min A, mod VC, tends to lean back.  11/02/22 independent with use of hands on arm chair with a couple of tries CURB: Min A-Has one step out to his deck. STAIRS: N/A  GAIT: Gait pattern: step to pattern, decreased arm swing- Right, decreased arm swing- Left, decreased step length- Right, decreased step length- Left, shuffling, festinating, trunk flexed, narrow BOS, poor foot clearance- Right, and poor foot clearance- Left Distance walked: 83' Assistive device utilized: Environmental consultant - 2 wheeled Level of assistance: Occasional min A Comments: mod VC to stay close to walker, take longer steps. FUNCTIONAL TESTS:  5 times sit to stand: 38.23  09/29/22  could not performs 5XSTS, 11/02/22 = 47 seconds with cues to use arms Timed up and go (TUG): 47.91  with RW, 07/21/22 TUG 29   seconds without device, 09/29/22 25 seconds, 11/02/22 TUG 20 seconds                                                                     PATIENT EDUCATION: Education details: Progress towards goals, POC Person educated: Patient and Spouse Education method: Programmer, multimedia, Facilities manager, and Verbal cues  Education comprehension: verbalized understanding, returned demonstration, verbal cues required, and needs further education  HOME EXERCISE PROGRAM: PWR! moves  GOALS: Goals reviewed with patient? Yes  SHORT TERM GOALS: Target date: 12/22/22  Perform HEP for PWR! Moves with cues from family. Baseline: Pt's wife, [name], notes walking and hitting beach ball as main HEP. Goal status: GOAL MET, 12/20/2022   2. Decrease 5 x STS to < 35 sec to demonstrate improved functional strength  Baseline: 47 seconds on 5/21 per note; 34.5 sec UE support 12/20/2022  Goal status: GOAL MET, 12/20/2022   3. Perform sit <> stand transfers from average chair height with Supervision.  Baseline: CGA  12/20/2022; (01/10/23) CGA  Goal status: NOT MET  LONG TERM GOALS: Target date: 01/22/23  Patient will perform HEP progression with caregiver assist. Baseline: has minimal HEP Goal status: ONGOING  2.  Decrease 5 x STS to < 25 sec to demonstrate improved functional strength Baseline: 47 seconds on 5/21 per note; (01/10/23) 36 sec w/ cues for speed/sequence>30.38 sec with BUE supprot Goal status: PARTIALLY MET, 01/24/2023  3. Improve quality of gait to be able to ambulate x 100 feet with supervision. Baseline: CGA with cues  Goal status: REVISED  4.  Patient will perform his bed mobility with MI including rolling, sit<>supine, and scooting in bed. Baseline: on hard surface mat he is independent but on soft bed at home he struggles, wife reports only needs a hand to get up now 11/02/22; (01/10/23) modified indep.  Goal status: MET  5.  Patient will demonstrate floor<>stand with CGA and verbal cues for patient's wife to  be able to assist. Baseline:  Has to call 9-1-1; +2 max assist floor>stand with UE support 01/19/2023 Goal status: GOAL NOT MET 01/19/2023 **UPDATED GOALS BELOW ** per recert 01/24/2023  SHORT TERM GOALS: Target date: 02/25/2023   Pt will perform HEP to maintain lower body strength, flexibility, posture with wife's assistance. Baseline:  Goal status: INITIAL   2. Pt will perform sit to stand, 8 of 10 reps with  BUE support, with supervision, to maintain safety with transfers.  Baseline: min guard  Goal status: 02/07/23-CGA from elevated surface and max VC to lean forward, ongoing   LONG TERM GOALS: Target date: 04/22/2023   Patient will perform HEP progression to maintain lower body strength, flexibility, posture with wife's assistance. Baseline:  Goal status: INITIAL  2.  Pt will ambulate 150-175 ft in 2 MWT with min guard assist, to preserve functional mobility within the home.  Baseline: Goal status:  INITIAL  3. Wife will verbalize tips for fall prevention education to minimize fall frequency Baseline:  Goal status: INITIAL   ASSESSMENT:  CLINICAL IMPRESSION:  Initiated maintenance program as patient is at high risk of  rapid decline without continued services, causing weakness, decreased balance, and increased fall risk as well as increased caregiver burden. His wife reports he gets fatigued after walking to and from mailbox and would like to be able to go further, so emphasized balance and LE strength today.  Pt has significant PMH and co-morbidities, including Parkinson's disease, hx of CVA and L sided weakness, as well as major neurocognitive disorder; these limit pt's independent carryover with PT exercises and require max cueing for optimal performance of exercises and mobility in therapy sessions.  Pt requires skill of PT to help patient and wife perform ongoing exercise and mobility to prevent rapid and significant decline in functional mobility and strength (which would  increase caregiver burden and further increase pt's risk of falls); therefore, continued skilled maintenance therapy is recommended for this patient.    OBJECTIVE IMPAIRMENTS: Abnormal gait, decreased activity tolerance, decreased balance, decreased cognition, decreased coordination, decreased endurance, decreased mobility, difficulty walking, decreased ROM, decreased strength, decreased safety awareness, impaired flexibility, impaired UE functional use, improper body mechanics, and postural dysfunction.   PLAN:  PT FREQUENCY: 1x/week  PT DURATION: 12 weeks  PLANNED INTERVENTIONS: Therapeutic exercises, Therapeutic activity, Neuromuscular re-education, Balance training, Gait training, Patient/Family education, Self Care, Joint mobilization, Stair training, Moist heat, and Manual therapy  PLAN FOR NEXT SESSION:   Continue exercises for lower body strength, sit<>Stand and walking for exercise.  Check about [facility] [employer] as part of his routine.   Iona Beard, DPT 02/07/2023, 5:26 PM  [facility] [place] [address] [place], [place], [address] Phone: [phone]   Fax:  [phone]

## 2023-02-08 NOTE — Progress Notes (Signed)
  Care Coordination   Note   02/08/2023 Name: JOSEHUA LITALIEN MRN: [id number] DOB: 1941-03-22  Marveen Reeks Valera is a 82 y.o. year old male who sees [name], [name], DO for primary care. I reached out to [name] by phone today to offer care coordination services.  Mr. Tapp was given information about Care Coordination services today including:   The Care Coordination services include support from the care team which includes your Nurse Coordinator, Clinical Social Worker, or Pharmacist.  The Care Coordination team is here to help remove barriers to the health concerns and goals most important to you. Care Coordination services are voluntary, and the patient may decline or stop services at any time by request to their care team member.   Care Coordination Consent Status: Patient agreed to services and verbal consent obtained.   Follow up plan:  Telephone appointment with care coordination team member scheduled for:  02/21/2023  Encounter Outcome:  Pt. Scheduled from referral   Burman Nieves, [facility] Care Coordination Care Guide Direct Dial: [phone]

## 2023-02-13 ENCOUNTER — Encounter (HOSPITAL_BASED_OUTPATIENT_CLINIC_OR_DEPARTMENT_OTHER): Payer: Self-pay

## 2023-02-14 ENCOUNTER — Other Ambulatory Visit (HOSPITAL_BASED_OUTPATIENT_CLINIC_OR_DEPARTMENT_OTHER): Payer: Self-pay

## 2023-02-15 ENCOUNTER — Other Ambulatory Visit (HOSPITAL_BASED_OUTPATIENT_CLINIC_OR_DEPARTMENT_OTHER): Payer: Self-pay

## 2023-02-16 ENCOUNTER — Other Ambulatory Visit (HOSPITAL_COMMUNITY): Payer: Self-pay | Admitting: Psychiatry

## 2023-02-16 ENCOUNTER — Other Ambulatory Visit (HOSPITAL_COMMUNITY): Payer: Self-pay | Admitting: Family Medicine

## 2023-02-16 ENCOUNTER — Other Ambulatory Visit (HOSPITAL_BASED_OUTPATIENT_CLINIC_OR_DEPARTMENT_OTHER): Payer: Self-pay

## 2023-02-16 ENCOUNTER — Ambulatory Visit: Payer: PPO | Attending: Family Medicine | Admitting: Physical Therapy

## 2023-02-16 ENCOUNTER — Encounter: Payer: Self-pay | Admitting: Physical Therapy

## 2023-02-16 DIAGNOSIS — M6281 Muscle weakness (generalized): Secondary | ICD-10-CM | POA: Insufficient documentation

## 2023-02-16 DIAGNOSIS — R2689 Other abnormalities of gait and mobility: Secondary | ICD-10-CM | POA: Insufficient documentation

## 2023-02-16 DIAGNOSIS — Z9181 History of falling: Secondary | ICD-10-CM | POA: Insufficient documentation

## 2023-02-16 DIAGNOSIS — R262 Difficulty in walking, not elsewhere classified: Secondary | ICD-10-CM | POA: Insufficient documentation

## 2023-02-16 DIAGNOSIS — I69354 Hemiplegia and hemiparesis following cerebral infarction affecting left non-dominant side: Secondary | ICD-10-CM | POA: Insufficient documentation

## 2023-02-16 NOTE — Therapy (Signed)
OUTPATIENT PHYSICAL THERAPY NEURO TREATMENT NOTE  Patient Name: William [name] MRN: [id number] DOB:Feb 27, 1941, 82 y.o., male Today's Date: 02/16/2023  PCP: Donato Schultz  DO REFERRING PROVIDER: same   END OF SESSION:  PT End of Session - 02/16/23 1619     Visit Number 56    Number of Visits 66    Date for PT Re-Evaluation 04/22/23    Authorization Type HTA    Progress Note Due on Visit 60    PT Start Time 1608   pt a few minutes late   PT Stop Time 1646    PT Time Calculation (min) 38 min    Equipment Utilized During Treatment Gait belt    Activity Tolerance Patient tolerated treatment well    Behavior During Therapy WFL for tasks assessed/performed             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   HTN (hypertension) 07/19/2015   Hyperlipidemia    Major neurocognitive disorder 01/09/2014   Mild, related to stroke history   Nocturia    Renal insufficiency 06/25/2013   S/P carotid endarterectomy    BILATERAL ICA--  PATENT PER DUPLEX  05-19-2012   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/26/2017   Right calf   Urinary frequency    Vitamin D deficiency    Past Surgical History:  Procedure Laterality Date   APPENDECTOMY  AS CHILD   CARDIOVASCULAR STRESS TEST  03-27-2012  DR CRENSHAW   LOW RISK LEXISCAN STUDY-- PROBABLE NORMAL PERFUSION AND SOFT TISSUE ATTENUATION/  NO ISCHEMIA/ EF 51%   CAROTID ENDARTERECTOMY Bilateral LEFT  11-12-2008  DR GREG HAYES   RIGHT ICA  2006  ([facility])   CHOLECYSTECTOMY N/A 02/23/2022   Procedure: LAPAROSCOPIC  CHOLECYSTECTOMY;  Surgeon: Quentin Ore, MD;  Location: [facility] OR;  Service: General;  Laterality: N/A;   CYSTOSCOPY W/ RETROGRADES Bilateral 06/22/2021   Procedure: CYSTOSCOPY WITH RETROGRADE PYELOGRAM;  Surgeon: Marcine Matar, MD;  Location: [facility];  Service: Urology;  Laterality: Bilateral;   CYSTOSCOPY WITH LITHOLAPAXY N/A 02/26/2013   Procedure: CYSTOSCOPY WITH LITHOLAPAXY;  Surgeon: Marcine Matar, MD;  Location: [facility];  Service: Urology;  Laterality: N/A;   ENDOSCOPIC RETROGRADE CHOLANGIOPANCREATOGRAPHY (ERCP) WITH PROPOFOL N/A 02/22/2022   Procedure: ENDOSCOPIC RETROGRADE CHOLANGIOPANCREATOGRAPHY (ERCP) WITH PROPOFOL;  Surgeon: Jeani Hawking, MD;  Location: [facility] ENDOSCOPY;  Service: Gastroenterology;  Laterality: N/A;   EYE SURGERY  Jan. 2016   cataract surgery both eyes   INGUINAL HERNIA REPAIR Right 11-08-2006   IR KYPHO EA ADDL LEVEL THORACIC OR LUMBAR  02/12/2021   IR RADIOLOGIST EVAL & MGMT  02/18/2021   MASS EXCISION N/A 03/03/2016   Procedure: EXCISION OF BACK  MASS;  Surgeon: Almond Lint, MD;  Location: [facility];  Service: General;  Laterality: N/A;   MOHS SURGERY Left 1/ 2016   Dr Margo Aye-- Basal cell   PROSTATE SURGERY     REMOVAL OF STONES  02/22/2022   Procedure: REMOVAL  OF STONES;  Surgeon: Jeani Hawking, MD;  Location: [facility] ENDOSCOPY;  Service: Gastroenterology;;   Dennison Mascot  02/22/2022   Procedure: Dennison Mascot;  Surgeon: Jeani Hawking, MD;  Location: [facility] ENDOSCOPY;  Service: Gastroenterology;;   TRANSURETHRAL RESECTION OF BLADDER TUMOR WITH MITOMYCIN-C N/A 06/22/2021   Procedure: TRANSURETHRAL RESECTION OF BLADDER TUMOR;  Surgeon: Marcine Matar, MD;  Location: [facility];  Service: Urology;  Laterality: N/A;   TRANSURETHRAL RESECTION OF PROSTATE N/A 02/26/2013   Procedure: TRANSURETHRAL RESECTION OF THE PROSTATE WITH GYRUS INSTRUMENTS;  Surgeon: Marcine Matar, MD;  Location: [facility];  Service: Urology;  Laterality: N/A;   TRANSURETHRAL RESECTION OF PROSTATE N/A 06/22/2021   Procedure: TRANSURETHRAL RESECTION OF THE PROSTATE (TURP);  Surgeon: Marcine Matar, MD;  Location: [facility];  Service: Urology;  Laterality: N/A;   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   Insomnia    Prediabetes    Acute 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 (HCC)    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   Transaminitis 07/27/2021   UTI (urinary tract infection) 07/27/2021   CVA (cerebral vascular accident) (HCC) 07/27/2021   Fall 07/27/2021   Hyperglycemia 07/27/2021   Cholelithiasis 07/27/2021   Hypoxia 07/27/2021   Nausea and vomiting 07/27/2021   Acute metabolic encephalopathy 07/27/2021   Normocytic anemia 07/27/2021   Chronic back pain 07/27/2021   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   Closed fracture of first lumbar vertebra with routine healing 02/03/2021   Closed fracture of multiple ribs 11/18/2020   Anxiety 01/29/2020   Leg pain, bilateral 01/29/2020   Ingrown toenail 07/13/2019   Lumbar spondylosis 05/02/2018   Pain in left knee 03/09/2018   Osteoarthritis of left hip 01/16/2018   Trochanteric bursitis of left hip 01/16/2018   Preventative health care 09/26/2017   HTN (hypertension) 07/19/2015    Hyperlipidemia 07/19/2015   Great toe pain 02/11/2014   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   Obesity (BMI 30-39.9) 06/25/2013   Renal insufficiency 06/25/2013   Weakness of left arm 06/25/2013   Sebaceous cyst 03/03/2011   Sprain of lumbar region 07/31/2010   Rib pain, left 08/29/2009   Carotid artery stenosis, asymptomatic, bilateral 05/02/2009   Eczema, atopic 05/31/2008   Vitamin D deficiency 03/01/2008   BPH (benign prostatic hyperplasia) 08/06/2007   Fasting hyperglycemia 12/21/2006   History of right MCA infarct 2006    ONSET DATE: 06/29/22 REFERRING DIAG:  Diagnosis  I63.9 (ICD-10-CM) - Cerebrovascular accident (CVA), unspecified mechanism (HCC)  W19.XXXD (ICD-10-CM) - Fall, subsequent encounter    THERAPY DIAG:  Muscle weakness (generalized)  Other abnormalities of gait and mobility  Rationale for Evaluation and Treatment: Rehabilitation  SUBJECTIVE:  SUBJECTIVE STATEMENT:    Doing good  PERTINENT HISTORY:  William [name] is a 82 year old man with dementia, CKD, HTN, CAD, HLD and history of CVA  (2006, 2023), Parkinson's  PAIN:  Are you having pain? No 0/10  PRECAUTIONS: Fall  WEIGHT BEARING RESTRICTIONS: No  FALLS: Has patient fallen in last 6 months? No  LIVING ENVIRONMENT: Lives with: lives with their family and lives with their spouse Lives in: House/apartment Stairs:  1 brick high step Has following equipment at home: Environmental consultant - 2 wheeled, Wheelchair (manual), Shower bench, bed side commode, Grab bars, and hospital bed, sliding pad for car  PLOF: Independent with basic ADLs and Independent with household mobility without device  PATIENT GOALS: Walk around the block with his wife. Be able to use the dining room chair rather than have to use the W/C.  Increased I with bed mobility and simple tasks at home like make some coffee or brush his teeth, Step over tub to shower, has bench, but he can't really use it. Up and down the one step to get to back deck. 11/23/22: Patient's wife [name] updated his goals today: Walk around store (gets distracted), swing a golf club again (thinking of driving range), get up on his own, walk without leaning forward, more recognition of the left side (from CVA), not need gait belt, OT-- be able to get dressed more on his own, navigate the newspaper.  OBJECTIVE:    TODAY'S TREATMENT 02/16/23  Nustep L4x8 minutes cues for big motions all extremities  Walking outside over grass with cues for big steps and foot clearance/step length, slowing pace  MinAx2 for sequencing/initiating transfer off of circular picnic table (turning left and then standing instead of standing up with legs blocked by table) Reaching down to floor to tap cone for wt shift, then up to center to toss ball with focus on upright posture/trunk stretch Alternating seated marches with focus on foot clearance Max cues for foot clearance STS no UEs x4   PATIENT EDUCATION: Education details: none new 02/16/23 Person educated: Patient and Spouse Education method: Explanation, Demonstration, Tactile cues, and Verbal cues Education comprehension: verbalized understanding, returned demonstration, verbal cues required, tactile cues required, and needs further education   ------------------------------------------------------------------------------------------------------- (Measures in this section from initial evaluation unless otherwise noted) DIAGNOSTIC FINDINGS:  MRI 2021 with degenerative changes in lumbar spine, L3-4 R subarticular stenosis COGNITION: Overall cognitive status: History of cognitive impairments - at baseline SENSATION: Not tested COORDINATION: Moderately impaired BLE MUSCLE LENGTH: Hamstrings: severely restricted B Thomas test:  Severely restricted B. POSTURE: rounded shoulders, forward head, increased thoracic kyphosis, posterior pelvic tilt, and flexed trunk  LOWER EXTREMITY ROM:   BLE extremely stiff throughout, limited hip ROM in all planes, B knees limited in extension. LOWER EXTREMITY MMT:  3+/5 throughout. Unable to determine accuracy of MMT due to cognition. Functionally demosntrates poor coordinated activation and poor muscular endurance. BED MOBILITY: Min A for Supine<> Sit. Patient was using a ladder in his hospital bed and could move MI. TRANSFERS: Min A, mod VC, tends to lean back.  11/02/22 independent with use of hands on arm chair with a couple of tries CURB: Min A-Has one step out to his deck. STAIRS: N/A  GAIT: Gait pattern: step to pattern, decreased arm swing- Right, decreased arm swing- Left, decreased step length- Right, decreased step length- Left, shuffling, festinating, trunk flexed, narrow BOS, poor foot clearance- Right, and poor foot clearance- Left Distance walked: 77' Assistive device utilized: Environmental consultant -  2 wheeled Level of assistance: Occasional min A Comments: mod VC to stay close to walker, take longer steps. FUNCTIONAL TESTS:  5 times sit to stand: 38.23  09/29/22  could not performs 5XSTS, 11/02/22 = 47 seconds with cues to use arms Timed up and go (TUG): 47.91  with RW, 07/21/22 TUG 29  seconds without device, 09/29/22 25 seconds, 11/02/22 TUG 20 seconds                                                                     PATIENT EDUCATION: Education details: Progress towards goals, POC Person educated: Patient and Spouse Education method: Programmer, multimedia, Facilities manager, and Verbal cues  Education comprehension: verbalized understanding, returned demonstration, verbal cues required, and needs further education  HOME EXERCISE PROGRAM: PWR! moves  GOALS: Goals reviewed with patient? Yes  SHORT TERM GOALS: Target date: 12/22/22  Perform HEP for PWR! Moves with cues from family. Baseline:  Pt's wife, [name], notes walking and hitting beach ball as main HEP. Goal status: GOAL MET, 12/20/2022   2. Decrease 5 x STS to < 35 sec to demonstrate improved functional strength  Baseline: 47 seconds on 5/21 per note; 34.5 sec UE support 12/20/2022  Goal status: GOAL MET, 12/20/2022   3. Perform sit <> stand transfers from average chair height with Supervision.  Baseline: CGA  12/20/2022; (01/10/23) CGA  Goal status: NOT MET  LONG TERM GOALS: Target date: 01/22/23  Patient will perform HEP progression with caregiver assist. Baseline: has minimal HEP Goal status: ONGOING  2.  Decrease 5 x STS to < 25 sec to demonstrate improved functional strength Baseline: 47 seconds on 5/21 per note; (01/10/23) 36 sec w/ cues for speed/sequence>30.38 sec with BUE supprot Goal status: PARTIALLY MET, 01/24/2023  3. Improve quality of gait to be able to ambulate x 100 feet with supervision. Baseline: CGA with cues  Goal status: REVISED  4.  Patient will perform his bed mobility with MI including rolling, sit<>supine, and scooting in bed. Baseline: on hard surface mat he is independent but on soft bed at home he struggles, wife reports only needs a hand to get up now 11/02/22; (01/10/23) modified indep.  Goal status: MET  5.  Patient will demonstrate floor<>stand with CGA and verbal cues for patient's wife to be able to assist. Baseline:  Has to call 9-1-1; +2 max assist floor>stand with UE support 01/19/2023 Goal status: GOAL NOT MET 01/19/2023 **UPDATED GOALS BELOW ** per recert 01/24/2023  SHORT TERM GOALS: Target date: 02/25/2023   Pt will perform HEP to maintain lower body strength, flexibility, posture with wife's assistance. Baseline:  Goal status: INITIAL   2. Pt will perform sit to stand, 8 of 10 reps with BUE support, with supervision, to maintain safety with transfers.  Baseline: min guard  Goal status: 02/07/23-CGA from elevated surface and max VC to lean forward, ongoing   LONG TERM GOALS: Target  date: 04/22/2023   Patient will perform HEP progression to maintain lower body strength, flexibility, posture with wife's assistance. Baseline:  Goal status: INITIAL  2.  Pt will ambulate 150-175 ft in 2 MWT with min guard assist, to preserve functional mobility within the home.  Baseline: Goal status:  INITIAL  3. Wife will verbalize tips for fall  prevention education to minimize fall frequency Baseline:  Goal status: INITIAL   ASSESSMENT:  CLINICAL IMPRESSION:   William [name] arrives today doing well, we continued working on general conditioning, strength and balance this session. Still needs up to Mod cues, very easily distracted, but followed commands from therapist well when redirected. Did have a significant moment of freezing/inability to initiate in novel situation when sitting at circular picnic table, needed MinAx2 to sequence and boost to standing. Will continue with maintenance program per established POC.   OBJECTIVE IMPAIRMENTS: Abnormal gait, decreased activity tolerance, decreased balance, decreased cognition, decreased coordination, decreased endurance, decreased mobility, difficulty walking, decreased ROM, decreased strength, decreased safety awareness, impaired flexibility, impaired UE functional use, improper body mechanics, and postural dysfunction.   PLAN:  PT FREQUENCY: 1x/week  PT DURATION: 12 weeks  PLANNED INTERVENTIONS: Therapeutic exercises, Therapeutic activity, Neuromuscular re-education, Balance training, Gait training, Patient/Family education, Self Care, Joint mobilization, Stair training, Moist heat, and Manual therapy  PLAN FOR NEXT SESSION:   Continue exercises for lower body strength, sit<>Stand and walking for exercise.  Check about [employer] as part of his routine.   Nedra Hai, PT, DPT 02/16/23 4:49 PM  [facility] [address] [facility] [place], [place], [address] Phone: [phone]   Fax:  [phone]

## 2023-02-17 ENCOUNTER — Other Ambulatory Visit (HOSPITAL_BASED_OUTPATIENT_CLINIC_OR_DEPARTMENT_OTHER): Payer: Self-pay

## 2023-02-20 ENCOUNTER — Encounter (HOSPITAL_COMMUNITY): Payer: Self-pay

## 2023-02-20 ENCOUNTER — Encounter: Payer: Self-pay | Admitting: Family Medicine

## 2023-02-21 ENCOUNTER — Ambulatory Visit: Payer: Self-pay | Admitting: Licensed Clinical Social Worker

## 2023-02-21 ENCOUNTER — Other Ambulatory Visit (HOSPITAL_COMMUNITY): Payer: Self-pay | Admitting: Family Medicine

## 2023-02-21 ENCOUNTER — Other Ambulatory Visit (HOSPITAL_BASED_OUTPATIENT_CLINIC_OR_DEPARTMENT_OTHER): Payer: Self-pay

## 2023-02-21 MED ORDER — TRAZODONE HCL 150 MG PO TABS: 75.0000 mg | ORAL_TABLET | Freq: Every day | ORAL | 1 refills | Status: DC

## 2023-02-21 NOTE — Patient Outreach (Signed)
  Care Coordination   Initial Visit Note   02/21/2023 Name: William [name] MRN: [id number] DOB: [birth year]  William [name] is a 82 y.o. year old male who sees William [name], William Congress, William [name] for primary care. I spoke with  William [name] / William [name] , William [name] of William [name], via phone today.  What matters to the patients health and wellness today?  Patient needs help with ADLs and with daily  functioning    Goals Addressed             This Visit's Progress    Patient needs help with ADLs and with daily funtioning       Interventions:  Spoke with William [name], William [name] of William [name], about William [name] needs Spoke with William [name] about ADLs completion for [employer]. William [name] said she helps William Maduro with all ADLs Spoke with William [name] about pain issues of William [name] Spoke with William [name] about appetite of William [name]. William [name] has reduced appetite Discussed William [name] participation in [name].  He goes to that [name] 3 days per week William [name] feels that [name] is very helpful to William [name] Discussed sleeping issues of William [name] Discussed medication procurement of William [name].  Discussed William [name]'s questions about SNFs in area. She said she will have surgery in a few months, date not determined yet; she wants to look at SNFs in area to see if SNF might be available for short term support for William [name] when she, William [name], is having her surgery and recovery.  William [name] talked with William [name] about William [name], William [name], William [name], William [name] facilities in area.  William [name] talked with William [name] about MD completion of William [name] for William [name] use in looking at William [name] facilities for William [name]. Discussed William [name] management of Parkinson's Disease. She spoke of William [name] use of William [name] (prescribed) Encouraged William [name] to call William [name] as needed to further SW support for William [name] at phone number [phone].           SDOH assessments and interventions completed:  Yes  SDOH Interventions Today    Flowsheet Row Most Recent Value  SDOH Interventions    Physical Activity Interventions Other (Comments)  [mobility issues.]  Stress Interventions Other (Comment)  [Stress related to managing medical needs]        Care Coordination Interventions:  Yes, provided    Interventions Today    Flowsheet Row Most Recent Value  Chronic Disease   Chronic disease during today's visit Other  [spoke with William [name], William [name], about William [name] needs]  General Interventions   General Interventions Discussed/Reviewed General Interventions Discussed, Community Resources  Exercise Interventions   Exercise Discussed/Reviewed Physical Activity  Education Interventions   Education Provided Provided Education  Provided Verbal Education On Community Resources  Mental Health Interventions   Mental Health Discussed/Reviewed Coping Strategies  [no mood issues noted]  Nutrition Interventions   Nutrition Discussed/Reviewed Nutrition Discussed  Pharmacy Interventions   Pharmacy Dicussed/Reviewed Pharmacy Topics Discussed  Safety Interventions   Safety Discussed/Reviewed Fall Risk       Follow up plan: Follow up call scheduled for 03/08/23 at 2:00 PM     Encounter Outcome:  Patient Visit Completed   William [name] MSW, William [name] Licensed Visual merchandiser [facility] Care Management [phone]

## 2023-02-21 NOTE — Patient Instructions (Signed)
Visit Information  Thank you for taking time to visit with me today. Please don't hesitate to contact me if I can be of assistance to you.   Following are the goals we discussed today:   Goals Addressed             This Visit's Progress    Patient needs help with ADLs and with daily funtioning       Interventions:  Spoke with [name], spouse of client, about client needs Spoke with [name] about ADLs completion for [employer]. [name] said she helps [name] with all ADLs Spoke with [name] about pain issues of client Spoke with [name] about appetite of client. Client has reduced appetite Discussed client participation in [facility].  He goes to that program 3 days per week [name] feels that day program at [facility] is very helpful to [place] Discussed sleeping issues of client Discussed medication procurement of client.  Discussed [name]'s questions about SNFs in area. She said she will have surgery in a few months, date not determined yet; she wants to look at SNFs in area to see if SNF might be available for short term support for client when she, [name], is having her surgery and recovery.  LCSW talked with [name] about [employer], [facility], [place], [place] facilities in area.  LCSW talked with [name] about MD completion of FL-2 for client use in looking at [facility] facilities for client. Discussed client management of Parkinson's Disease. She spoke of client use of Sinemet (prescribed) Encouraged [name] to call LCSW as needed to further SW support for client at phone number [phone].           Our next appointment is by telephone on 03/08/23 at 2:00 PM   Please call the care guide team at [phone] if you need to cancel or reschedule your appointment.   If you are experiencing a Mental Health or Behavioral Health Crisis or need someone to talk to, please go to [facility] [address], [place]  [phone])   The patient/ William [name], spouse,  verbalized understanding of instructions, educational materials, and care plan provided today and DECLINED offer to receive copy of patient instructions, educational materials, and care plan.   The patient / Glenmore Luiz, spouse, has been provided with contact information for the care management team and has been advised to call with any health related questions or concerns.   Kelton Pillar.Inas Avena MSW, LCSW Licensed Visual merchandiser [facility] Care Management [phone]

## 2023-02-22 ENCOUNTER — Other Ambulatory Visit (HOSPITAL_BASED_OUTPATIENT_CLINIC_OR_DEPARTMENT_OTHER): Payer: Self-pay

## 2023-02-22 ENCOUNTER — Ambulatory Visit: Payer: PPO | Admitting: Physical Therapy

## 2023-02-22 ENCOUNTER — Encounter: Payer: Self-pay | Admitting: Physical Therapy

## 2023-02-22 DIAGNOSIS — R2689 Other abnormalities of gait and mobility: Secondary | ICD-10-CM

## 2023-02-22 DIAGNOSIS — M6281 Muscle weakness (generalized): Secondary | ICD-10-CM

## 2023-02-22 DIAGNOSIS — Z9181 History of falling: Secondary | ICD-10-CM

## 2023-02-22 DIAGNOSIS — R262 Difficulty in walking, not elsewhere classified: Secondary | ICD-10-CM

## 2023-02-22 DIAGNOSIS — I69354 Hemiplegia and hemiparesis following cerebral infarction affecting left non-dominant side: Secondary | ICD-10-CM

## 2023-02-22 MED ORDER — INFLUENZA VAC A&B SURF ANT ADJ 0.5 ML IM SUSY
0.5000 mL | PREFILLED_SYRINGE | Freq: Once | INTRAMUSCULAR | 0 refills | Status: AC
  Filled 2023-02-22: qty 0.5, 1d supply, fill #0

## 2023-02-22 MED ORDER — CLOBETASOL PROPIONATE 0.05 % EX CREA
1.0000 | TOPICAL_CREAM | Freq: Two times a day (BID) | CUTANEOUS | 3 refills | Status: DC | PRN
  Filled 2023-02-22: qty 60, 30d supply, fill #0

## 2023-02-22 MED ORDER — COVID-19 MRNA VAC-TRIS(PFIZER) 30 MCG/0.3ML IM SUSY
0.3000 mL | PREFILLED_SYRINGE | Freq: Once | INTRAMUSCULAR | 0 refills | Status: AC
  Filled 2023-02-22: qty 0.3, 1d supply, fill #0

## 2023-02-22 NOTE — Therapy (Signed)
OUTPATIENT PHYSICAL THERAPY NEURO TREATMENT NOTE  Patient Name: William [name] MRN: [id number] DOB:12-24-1940, 82 y.o., male Today's Date: 02/22/2023  PCP: Donato Schultz  DO REFERRING PROVIDER: same   END OF SESSION:  PT End of Session - 02/22/23 1637     Visit Number 51    Number of Visits 66    Date for PT Re-Evaluation 04/22/23    Authorization Type HTA    Progress Note Due on Visit 60    PT Start Time 1610   pt late   PT Stop Time 1646    PT Time Calculation (min) 36 min    Equipment Utilized During Treatment Gait belt    Activity Tolerance Patient tolerated treatment well    Behavior During Therapy WFL for tasks assessed/performed              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   HTN (hypertension) 07/19/2015   Hyperlipidemia    Major neurocognitive disorder 01/09/2014   Mild, related to stroke history   Nocturia    Renal insufficiency 06/25/2013   S/P carotid endarterectomy    BILATERAL ICA--  PATENT PER DUPLEX  05-19-2012   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   Right calf   Urinary frequency    Vitamin D deficiency    Past Surgical History:  Procedure Laterality Date   APPENDECTOMY  AS CHILD   CARDIOVASCULAR STRESS TEST  03-27-2012  DR CRENSHAW   LOW RISK LEXISCAN STUDY-- PROBABLE NORMAL PERFUSION AND SOFT TISSUE ATTENUATION/  NO ISCHEMIA/ EF 51%   CAROTID ENDARTERECTOMY Bilateral LEFT  11-12-2008  DR GREG HAYES   RIGHT ICA  2006  ([facility])   CHOLECYSTECTOMY N/A 02/23/2022   Procedure: LAPAROSCOPIC CHOLECYSTECTOMY;   Surgeon: Quentin Ore, MD;  Location: [facility] OR;  Service: General;  Laterality: N/A;   CYSTOSCOPY W/ RETROGRADES Bilateral 06/22/2021   Procedure: CYSTOSCOPY WITH RETROGRADE PYELOGRAM;  Surgeon: Marcine Matar, MD;  Location: [facility];  Service: Urology;  Laterality: Bilateral;   CYSTOSCOPY WITH LITHOLAPAXY N/A 02/26/2013   Procedure: CYSTOSCOPY WITH LITHOLAPAXY;  Surgeon: Marcine Matar, MD;  Location: [facility];  Service: Urology;  Laterality: N/A;   ENDOSCOPIC RETROGRADE CHOLANGIOPANCREATOGRAPHY (ERCP) WITH PROPOFOL N/A 02/22/2022   Procedure: ENDOSCOPIC RETROGRADE CHOLANGIOPANCREATOGRAPHY (ERCP) WITH PROPOFOL;  Surgeon: Jeani Hawking, MD;  Location: [facility] ENDOSCOPY;  Service: Gastroenterology;  Laterality: N/A;   EYE SURGERY  Jan. 2016   cataract surgery both eyes   INGUINAL HERNIA REPAIR Right 11-08-2006   IR KYPHO EA ADDL LEVEL THORACIC OR LUMBAR  02/12/2021   IR RADIOLOGIST EVAL & MGMT  02/18/2021   MASS EXCISION N/A 03/03/2016   Procedure: EXCISION OF BACK  MASS;  Surgeon: Almond Lint, MD;  Location: [facility];  Service: General;  Laterality: N/A;   MOHS SURGERY Left 1/ 2016   Dr Margo Aye-- Basal cell   PROSTATE SURGERY     REMOVAL OF STONES  02/22/2022   Procedure: REMOVAL OF STONES;  Surgeon: Jeani Hawking, MD;  Location: [facility] ENDOSCOPY;  Service: Gastroenterology;;   Dennison Mascot  02/22/2022   Procedure: Dennison Mascot;  Surgeon: Jeani Hawking, MD;  Location: [facility] ENDOSCOPY;  Service: Gastroenterology;;   TRANSURETHRAL RESECTION OF BLADDER TUMOR WITH MITOMYCIN-C N/A 06/22/2021   Procedure: TRANSURETHRAL RESECTION OF BLADDER TUMOR;  Surgeon: Marcine Matar, MD;  Location: [facility];  Service: Urology;  Laterality: N/A;   TRANSURETHRAL RESECTION OF PROSTATE N/A 02/26/2013   Procedure: TRANSURETHRAL RESECTION OF THE PROSTATE WITH GYRUS INSTRUMENTS;  Surgeon: Marcine Matar, MD;  Location: [facility];   Service: Urology;  Laterality: N/A;   TRANSURETHRAL RESECTION OF PROSTATE N/A 06/22/2021   Procedure: TRANSURETHRAL RESECTION OF THE PROSTATE (TURP);  Surgeon: Marcine Matar, MD;  Location: [facility];  Service: Urology;  Laterality: N/A;   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   Insomnia    Prediabetes    Acute 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 (HCC)    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   Transaminitis 07/27/2021   UTI (urinary tract infection) 07/27/2021   CVA (cerebral vascular accident) (HCC) 07/27/2021   Fall 07/27/2021   Hyperglycemia 07/27/2021   Cholelithiasis 07/27/2021   Hypoxia 07/27/2021   Nausea and vomiting 07/27/2021   Acute metabolic encephalopathy 07/27/2021   Normocytic anemia 07/27/2021   Chronic back pain 07/27/2021   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   Closed fracture of first lumbar vertebra with routine healing 02/03/2021   Closed fracture of multiple ribs 11/18/2020   Anxiety 01/29/2020   Leg pain, bilateral 01/29/2020   Ingrown toenail 07/13/2019   Lumbar spondylosis 05/02/2018   Pain in left knee 03/09/2018   Osteoarthritis of left hip 01/16/2018   Trochanteric bursitis of left hip 01/16/2018   Preventative health care 09/26/2017   HTN (hypertension) 07/19/2015   Hyperlipidemia 07/19/2015    Great toe pain 02/11/2014   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   Obesity (BMI 30-39.9) 06/25/2013   Renal insufficiency 06/25/2013   Weakness of left arm 06/25/2013   Sebaceous cyst 03/03/2011   Sprain of lumbar region 07/31/2010   Rib pain, left 08/29/2009   Carotid artery stenosis, asymptomatic, bilateral 05/02/2009   Eczema, atopic 05/31/2008   Vitamin D deficiency 03/01/2008   BPH (benign prostatic hyperplasia) 08/06/2007   Fasting hyperglycemia 12/21/2006   History of right MCA infarct 2006    ONSET DATE: 06/29/22 REFERRING DIAG:  Diagnosis  I63.9 (ICD-10-CM) - Cerebrovascular accident (CVA), unspecified mechanism (HCC)  W19.XXXD (ICD-10-CM) - Fall, subsequent encounter    THERAPY DIAG:  Muscle weakness (generalized)  Other abnormalities of gait and mobility  Hemiplegia and hemiparesis following cerebral infarction affecting left non-dominant side (HCC)  History of falling  Difficulty in walking, not elsewhere classified  Rationale for Evaluation and Treatment: Rehabilitation  SUBJECTIVE:  SUBJECTIVE STATEMENT:    Not feeling good today, my legs hurt. They worked me out hard at the other place   Per spouse: yesterday was the first day for the bus for [facility], left early and didn't get back until 4:30pm so missed some medicines, he goes again tomorrow and we will keep that from happening again. MD is working with [place] for orders for [facility] to have him up and moving regularly, noticed he was more tired yesterday and today   PERTINENT HISTORY:  THRISTAN DOWNIE is a 82 year old man with dementia, CKD, HTN, CAD, HLD and history of CVA  (2006, 2023), Parkinson's  PAIN:  Are you having pain? Faces/behavior scale- mild pain BLEs   PRECAUTIONS: Fall  WEIGHT BEARING  RESTRICTIONS: No  FALLS: Has patient fallen in last 6 months? No  LIVING ENVIRONMENT: Lives with: lives with their family and lives with their spouse Lives in: House/apartment Stairs:  1 brick high step Has following equipment at home: Environmental consultant - 2 wheeled, Wheelchair (manual), Shower bench, bed side commode, Grab bars, and hospital bed, sliding pad for car  PLOF: Independent with basic ADLs and Independent with household mobility without device  PATIENT GOALS: Walk around the block with his wife. Be able to use the dining room chair rather than have to use the W/C. Increased I with bed mobility and simple tasks at home like make some coffee or brush his teeth, Step over tub to shower, has bench, but he can't really use it. Up and down the one step to get to back deck. 11/23/22: Patient's wife [name] updated his goals today: Walk around store (gets distracted), swing a golf club again (thinking of driving range), get up on his own, walk without leaning forward, more recognition of the left side (from CVA), not need gait belt, OT-- be able to get dressed more on his own, navigate the newspaper.  OBJECTIVE:    TODAY'S TREATMENT 02/22/23  Nustep L3 x6 minutes (decreased resistance due to [place] reporting some BLE pain this session) intermittent MinA for amplitude of movement  Seated PWR moves (PWR up, reach, rotation, step) with cognitive boosts  Standing PWR moves- lateral reaches  with visual boost with cones, multimodal cues for use of LUE    TODAY'S TREATMENT 02/16/23  Nustep L4x8 minutes cues for big motions all extremities  Walking outside over grass with cues for big steps and foot clearance/step length, slowing pace  MinAx2 for sequencing/initiating transfer off of circular picnic table (turning left and then standing instead of standing up with legs blocked by table) Reaching down to floor to tap cone for wt shift, then up to center to toss ball with focus on upright posture/trunk  stretch Alternating seated marches with focus on foot clearance Max cues for foot clearance STS no UEs x4   PATIENT EDUCATION: Education details: none new 02/16/23 Person educated: Patient and Spouse Education method: Explanation, Demonstration, Tactile cues, and Verbal cues Education comprehension: verbalized understanding, returned demonstration, verbal cues required, tactile cues required, and needs further education   ------------------------------------------------------------------------------------------------------- (Measures in this section from initial evaluation unless otherwise noted) DIAGNOSTIC FINDINGS:  MRI 2021 with degenerative changes in lumbar spine, L3-4 R subarticular stenosis COGNITION: Overall cognitive status: History of cognitive impairments - at baseline SENSATION: Not tested COORDINATION: Moderately impaired BLE MUSCLE LENGTH: Hamstrings: severely restricted B Thomas test: Severely restricted B. POSTURE: rounded shoulders, forward head, increased thoracic kyphosis, posterior pelvic tilt, and flexed trunk  LOWER EXTREMITY ROM:  BLE extremely stiff throughout, limited hip ROM in all planes, B knees limited in extension. LOWER EXTREMITY MMT:  3+/5 throughout. Unable to determine accuracy of MMT due to cognition. Functionally demosntrates poor coordinated activation and poor muscular endurance. BED MOBILITY: Min A for Supine<> Sit. Patient was using a ladder in his hospital bed and could move MI. TRANSFERS: Min A, mod VC, tends to lean back.  11/02/22 independent with use of hands on arm chair with a couple of tries CURB: Min A-Has one step out to his deck. STAIRS: N/A  GAIT: Gait pattern: step to pattern, decreased arm swing- Right, decreased arm swing- Left, decreased step length- Right, decreased step length- Left, shuffling, festinating, trunk flexed, narrow BOS, poor foot clearance- Right, and poor foot clearance- Left Distance walked: 23' Assistive device  utilized: Environmental consultant - 2 wheeled Level of assistance: Occasional min A Comments: mod VC to stay close to walker, take longer steps. FUNCTIONAL TESTS:  5 times sit to stand: 38.23  09/29/22  could not performs 5XSTS, 11/02/22 = 47 seconds with cues to use arms Timed up and go (TUG): 47.91  with RW, 07/21/22 TUG 29  seconds without device, 09/29/22 25 seconds, 11/02/22 TUG 20 seconds                                                                     PATIENT EDUCATION: Education details: Progress towards goals, POC Person educated: Patient and Spouse Education method: Programmer, multimedia, Facilities manager, and Verbal cues  Education comprehension: verbalized understanding, returned demonstration, verbal cues required, and needs further education  HOME EXERCISE PROGRAM: PWR! moves  GOALS: Goals reviewed with patient? Yes  SHORT TERM GOALS: Target date: 12/22/22  Perform HEP for PWR! Moves with cues from family. Baseline: Pt's wife, [name], notes walking and hitting beach ball as main HEP. Goal status: GOAL MET, 12/20/2022   2. Decrease 5 x STS to < 35 sec to demonstrate improved functional strength  Baseline: 47 seconds on 5/21 per note; 34.5 sec UE support 12/20/2022  Goal status: GOAL MET, 12/20/2022   3. Perform sit <> stand transfers from average chair height with Supervision.  Baseline: CGA  12/20/2022; (01/10/23) CGA  Goal status: NOT MET  LONG TERM GOALS: Target date: 01/22/23  Patient will perform HEP progression with caregiver assist. Baseline: has minimal HEP Goal status: ONGOING  2.  Decrease 5 x STS to < 25 sec to demonstrate improved functional strength Baseline: 47 seconds on 5/21 per note; (01/10/23) 36 sec w/ cues for speed/sequence>30.38 sec with BUE supprot Goal status: PARTIALLY MET, 01/24/2023  3. Improve quality of gait to be able to ambulate x 100 feet with supervision. Baseline: CGA with cues  Goal status: REVISED  4.  Patient will perform his bed mobility with MI including rolling,  sit<>supine, and scooting in bed. Baseline: on hard surface mat he is independent but on soft bed at home he struggles, wife reports only needs a hand to get up now 11/02/22; (01/10/23) modified indep.  Goal status: MET  5.  Patient will demonstrate floor<>stand with CGA and verbal cues for patient's wife to be able to assist. Baseline:  Has to call 9-1-1; +2 max assist floor>stand with UE support 01/19/2023 Goal status: GOAL NOT MET 01/19/2023 **UPDATED GOALS  BELOW ** per recert 01/24/2023  SHORT TERM GOALS: Target date: 02/25/2023   Pt will perform HEP to maintain lower body strength, flexibility, posture with wife's assistance. Baseline:  Goal status: INITIAL   2. Pt will perform sit to stand, 8 of 10 reps with BUE support, with supervision, to maintain safety with transfers.  Baseline: min guard  Goal status: 02/07/23-CGA from elevated surface and max VC to lean forward, ongoing   LONG TERM GOALS: Target date: 04/22/2023   Patient will perform HEP progression to maintain lower body strength, flexibility, posture with wife's assistance. Baseline:  Goal status: INITIAL  2.  Pt will ambulate 150-175 ft in 2 MWT with min guard assist, to preserve functional mobility within the home.  Baseline: Goal status:  INITIAL  3. Wife will verbalize tips for fall prevention education to minimize fall frequency Baseline:  Goal status: INITIAL   ASSESSMENT:  CLINICAL IMPRESSION:   Nadine Counts arrives today doing OK, we continued focus on functional exercise as able and tolerated today with emphasis on PWR moves and high speed/high intensity movement intervals as able and tolerated. Still very easily distracted and required heavy multimodal cues for focus on task with PT today. Did have some coughing drinking water, cues provided for small sips and slower pace of drinking but still had a cough.  Modified session PRN today as he did complain of some BLE pain, repeatedly told me he was really fatigued from his  workout yesterday. Will continue per POC.   OBJECTIVE IMPAIRMENTS: Abnormal gait, decreased activity tolerance, decreased balance, decreased cognition, decreased coordination, decreased endurance, decreased mobility, difficulty walking, decreased ROM, decreased strength, decreased safety awareness, impaired flexibility, impaired UE functional use, improper body mechanics, and postural dysfunction.   PLAN:  PT FREQUENCY: 1x/week  PT DURATION: 12 weeks  PLANNED INTERVENTIONS: Therapeutic exercises, Therapeutic activity, Neuromuscular re-education, Balance training, Gait training, Patient/Family education, Self Care, Joint mobilization, Stair training, Moist heat, and Manual therapy  PLAN FOR NEXT SESSION:   Continue exercises for lower body strength, sit<>Stand and walking for exercise.  Check about [employer] as part of his routine. How is [facility] routine going?   Nedra Hai, PT, DPT 02/22/23 4:50 PM  [facility] at [facility] [address] [facility]. [place], [place], [address] Phone: [phone]   Fax:  [phone]

## 2023-02-23 ENCOUNTER — Ambulatory Visit
Admission: EM | Admit: 2023-02-23 | Discharge: 2023-02-23 | Disposition: A | Discharge status: Home / Self Care | Payer: PPO | Attending: Internal Medicine | Admitting: Internal Medicine

## 2023-02-23 DIAGNOSIS — S51012A Laceration without foreign body of left elbow, initial encounter: Secondary | ICD-10-CM

## 2023-02-23 NOTE — Discharge Instructions (Addendum)
Keep the wound clean and dry.  Monitor for any signs of infection which include but are not limited to redness, swelling, warmth, drainage, fevers or chills then please seek reevaluation if these occur.  Follow-up with your PCP in 2 days for recheck.  Please go to the emergency room for any worsening symptoms.  I hope you feel better soon!
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
## 2023-02-24 ENCOUNTER — Ambulatory Visit (INDEPENDENT_AMBULATORY_CARE_PROVIDER_SITE_OTHER): Payer: PPO | Admitting: Family Medicine

## 2023-02-24 ENCOUNTER — Telehealth: Payer: Self-pay | Admitting: Family Medicine

## 2023-02-24 ENCOUNTER — Other Ambulatory Visit (HOSPITAL_BASED_OUTPATIENT_CLINIC_OR_DEPARTMENT_OTHER): Payer: Self-pay

## 2023-02-24 ENCOUNTER — Encounter: Payer: Self-pay | Admitting: Family Medicine

## 2023-02-24 VITALS — BP 140/80 | HR 63 | Temp 97.6°F | Resp 18 | Ht 69.0 in | Wt 161.6 lb

## 2023-02-24 DIAGNOSIS — I10 Essential (primary) hypertension: Secondary | ICD-10-CM | POA: Diagnosis not present

## 2023-02-24 DIAGNOSIS — G20A1 Parkinson's disease without dyskinesia, without mention of fluctuations: Secondary | ICD-10-CM | POA: Diagnosis not present

## 2023-02-24 DIAGNOSIS — G4700 Insomnia, unspecified: Secondary | ICD-10-CM | POA: Diagnosis not present

## 2023-02-24 DIAGNOSIS — S41111A Laceration without foreign body of right upper arm, initial encounter: Secondary | ICD-10-CM | POA: Diagnosis not present

## 2023-02-24 DIAGNOSIS — R918 Other nonspecific abnormal finding of lung field: Secondary | ICD-10-CM | POA: Diagnosis not present

## 2023-02-24 DIAGNOSIS — S41112A Laceration without foreign body of left upper arm, initial encounter: Secondary | ICD-10-CM

## 2023-02-24 DIAGNOSIS — Z8673 Personal history of transient ischemic attack (TIA), and cerebral infarction without residual deficits: Secondary | ICD-10-CM

## 2023-02-24 DIAGNOSIS — L309 Dermatitis, unspecified: Secondary | ICD-10-CM | POA: Diagnosis not present

## 2023-02-24 DIAGNOSIS — F0390 Unspecified dementia without behavioral disturbance: Secondary | ICD-10-CM

## 2023-02-24 DIAGNOSIS — E785 Hyperlipidemia, unspecified: Secondary | ICD-10-CM | POA: Diagnosis not present

## 2023-02-24 DIAGNOSIS — I63511 Cerebral infarction due to unspecified occlusion or stenosis of right middle cerebral artery: Secondary | ICD-10-CM

## 2023-02-24 MED ORDER — TRAZODONE HCL 150 MG PO TABS
75.0000 mg | ORAL_TABLET | Freq: Every day | ORAL | 1 refills | Status: DC
Start: 2023-02-24 — End: 2023-03-29
  Filled 2023-02-24 – 2023-03-27 (×2): qty 20, 40d supply, fill #0

## 2023-02-24 NOTE — Progress Notes (Signed)
Established Patient Office Visit  Subjective   Patient ID: William [name], male    DOB: 1941/04/10  Age: 82 y.o. MRN: [id number]  Chief Complaint  Patient presents with   ED follow up    Skin tear to the left upper arm.     HPI Discussed the use of AI scribe software for clinical note transcription with the patient, who gave verbal consent to proceed.  History of Present Illness   The patient, on Plavix for prevention of heart attacks and stroke, presents with two recent incidents of prolonged bleeding after minor injuries. The first incident occurred when the patient hit his arm on a wheelchair during a bus ride, resulting in bleeding that lasted all day despite attempts to control it with a bandage. The second incident occurred when the patient cut himself, and the bleeding was eventually controlled with a liquid bandage. The patient's caregiver is concerned about the prolonged bleeding due to the patient's Plavix medication.  The patient also has a history of dermatitis, which has been causing itching. The patient's caregiver reports that the dermatitis has been persistent since the patient's stay in a rehabilitation center. 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  The patient has a persistent cough, which the caregiver reports has sounded worse recently. During a recent CT scan of the abdomen, nodules were found on the patient's lungs. The caregiver is concerned about these nodules, as the doctor who performed the scan could not definitively rule out cancer.  The patient is also on Trazodone and Lorazepam. The caregiver reports that there are issues with the Lorazepam prescription, as the pharmacy will only provide ninety pills, but the patient requires one hundred and five pills per month. The caregiver is considering asking  the prescribing doctor to change the way the prescription is written to resolve this issue.  The caregiver inquires about the RSV vaccine, as the patient has not received it.      Patient Active Problem List   Diagnosis Date Noted   Laceration of right upper extremity 02/24/2023   Dermatitis 02/24/2023   Lung nodules 02/24/2023   Dysuria 08/03/2022   Nonintractable headache 07/01/2022   Bilateral impacted cerumen 06/24/2022   Rash 06/15/2022   Postoperative ileus (HCC) 02/27/2022   Ileus, postoperative (HCC) 02/26/2022   Choledocholithiasis 02/19/2022   DNR (do not resuscitate) 02/19/2022   Left elbow pain 01/26/2022   Mid back pain on left side 01/26/2022   Rib pain 01/26/2022   Acute pain of left shoulder 11/12/2021   Leukocytes in urine 11/12/2021   Urinary frequency 11/12/2021   Thrush 10/08/2021   Hemiplegia, 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   Insomnia    Prediabetes    Acute 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 (HCC)    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   Transaminitis 07/27/2021   UTI (urinary tract infection) 07/27/2021   CVA (cerebral vascular accident) (HCC) 07/27/2021   Fall 07/27/2021   Hyperglycemia 07/27/2021   Cholelithiasis 07/27/2021   Hypoxia 07/27/2021   Nausea and vomiting 07/27/2021   Acute metabolic encephalopathy 07/27/2021   Normocytic anemia 07/27/2021   Chronic back pain 07/27/2021   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   Closed fracture of first lumbar vertebra with routine healing 02/03/2021   Closed fracture of multiple ribs  11/18/2020   Anxiety 01/29/2020   Leg pain, bilateral 01/29/2020   Ingrown toenail 07/13/2019   Lumbar spondylosis 05/02/2018   Pain in left knee 03/09/2018   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   Trochanteric bursitis of left hip 01/16/2018   Preventative health care 09/26/2017   HTN (hypertension) 07/19/2015    Hyperlipidemia 07/19/2015   Great toe pain 02/11/2014   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   Obesity (BMI 30-39.9) 06/25/2013   Renal insufficiency 06/25/2013   Weakness of left arm 06/25/2013   Sebaceous cyst 03/03/2011   Sprain of lumbar region 07/31/2010   Rib pain, left 08/29/2009   Carotid artery stenosis, asymptomatic, bilateral 05/02/2009   Eczema, atopic 05/31/2008   Vitamin D deficiency 03/01/2008   BPH (benign prostatic hyperplasia) 08/06/2007   Fasting hyperglycemia 12/21/2006   History of right MCA infarct 2006   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   HTN (hypertension) 07/19/2015   Hyperlipidemia    Major neurocognitive disorder 01/09/2014   Mild, related to stroke history   Nocturia    Renal insufficiency 06/25/2013   S/P carotid endarterectomy    BILATERAL ICA--  PATENT PER DUPLEX  05-19-2012   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   Right calf   Urinary frequency    Vitamin D deficiency    Past Surgical History:  Procedure Laterality Date   APPENDECTOMY  AS CHILD   CARDIOVASCULAR STRESS TEST  03-27-2012  DR CRENSHAW   LOW RISK LEXISCAN STUDY-- PROBABLE NORMAL PERFUSION AND SOFT TISSUE ATTENUATION/  NO ISCHEMIA/ EF 51%   CAROTID ENDARTERECTOMY Bilateral LEFT  11-12-2008  DR GREG HAYES   RIGHT ICA  2006  ([facility])   CHOLECYSTECTOMY N/A 02/23/2022   Procedure: LAPAROSCOPIC CHOLECYSTECTOMY;  Surgeon: Quentin Ore, MD;  Location: [facility] OR;  Service: General;  Laterality: N/A;   CYSTOSCOPY W/ RETROGRADES Bilateral  06/22/2021   Procedure: CYSTOSCOPY WITH RETROGRADE PYELOGRAM;  Surgeon: Marcine Matar, MD;  Location: [facility];  Service: Urology;  Laterality: Bilateral;   CYSTOSCOPY WITH LITHOLAPAXY N/A 02/26/2013   Procedure: CYSTOSCOPY WITH LITHOLAPAXY;  Surgeon: Marcine Matar, MD;  Location: [facility];  Service: Urology;  Laterality: N/A;   ENDOSCOPIC RETROGRADE CHOLANGIOPANCREATOGRAPHY (ERCP) WITH PROPOFOL N/A 02/22/2022   Procedure: ENDOSCOPIC RETROGRADE CHOLANGIOPANCREATOGRAPHY (ERCP) WITH PROPOFOL;  Surgeon: Jeani Hawking, MD;  Location: [facility] ENDOSCOPY;  Service: Gastroenterology;  Laterality: N/A;   EYE SURGERY  Jan. 2016   cataract surgery both eyes   INGUINAL HERNIA REPAIR Right 11-08-2006   IR KYPHO EA ADDL LEVEL THORACIC OR LUMBAR  02/12/2021   IR RADIOLOGIST EVAL & MGMT  02/18/2021   MASS EXCISION N/A 03/03/2016   Procedure: EXCISION OF BACK  MASS;  Surgeon: Almond Lint, MD;  Location: [facility];  Service: General;  Laterality: N/A;   MOHS SURGERY Left 1/ 2016   Dr Margo Aye-- Basal cell   PROSTATE SURGERY     REMOVAL OF STONES  02/22/2022   Procedure: REMOVAL OF STONES;  Surgeon: Jeani Hawking, MD;  Location: [facility] ENDOSCOPY;  Service: Gastroenterology;;   Dennison Mascot  02/22/2022   Procedure: Dennison Mascot;  Surgeon: Jeani Hawking, MD;  Location: [facility] ENDOSCOPY;  Service: Gastroenterology;;   TRANSURETHRAL RESECTION OF BLADDER TUMOR WITH MITOMYCIN-C N/A 06/22/2021   Procedure: TRANSURETHRAL RESECTION OF BLADDER TUMOR;  Surgeon: Marcine Matar, MD;  Location: [facility];  Service: Urology;  Laterality: N/A;   TRANSURETHRAL RESECTION OF PROSTATE N/A 02/26/2013   Procedure: TRANSURETHRAL RESECTION OF THE PROSTATE WITH GYRUS INSTRUMENTS;  Surgeon: Marcine Matar, MD;  Location: [facility];  Service: Urology;  Laterality: N/A;   TRANSURETHRAL RESECTION OF PROSTATE N/A 06/22/2021   Procedure: TRANSURETHRAL RESECTION OF THE  PROSTATE (TURP);  Surgeon: Marcine Matar, MD;  Location: [facility];  Service: Urology;  Laterality: N/A;   Social History   Tobacco Use   Smoking status: Former    Current packs/day: 0.00    Average packs/day: 2.0 packs/day for 40.0 years (80.0 ttl pk-yrs)    Types: Cigarettes    Start date: 02/15/1965    Quit date: 02/15/2005    Years since quitting: 18.0   Smokeless tobacco: Never  Vaping Use   Vaping status: Never Used  Substance Use Topics   Alcohol use: Not Currently    Comment: Occasional   Drug use: No   Social History   Socioeconomic History   Marital status: Married    Spouse name: Not on file   Number of children: 2   Years of education: 12   Highest education level: 12th grade  Occupational History    Employer: Retired  Tobacco Use   Smoking status: Former    Current packs/day: 0.00    Average packs/day: 2.0 packs/day for 40.0 years (80.0 ttl pk-yrs)    Types: Cigarettes    Start date: 02/15/1965    Quit date: 02/15/2005    Years since quitting: 18.0   Smokeless tobacco: Never  Vaping Use   Vaping status: Never Used  Substance and Sexual Activity   Alcohol use: Not Currently    Comment: Occasional   Drug use: No   Sexual activity: Not Currently    Partners: Female  Other Topics Concern   Not on file  Social History Narrative   Exercise--  Walks with assistance -- using wheelchair more than the walker       LIves with wife , no stairs in home, caffeine - one cup coffee day, exercise - not much, Right handed, 12th grade, retired      One story home   Social Determinants of Health   Financial Resource Strain: Low Risk  (12/20/2022)   Overall Financial Resource Strain (CARDIA)    Difficulty of Paying Living Expenses: Not very hard  Food Insecurity: No Food Insecurity (12/20/2022)   Hunger Vital Sign    Worried About Running Out of Food in the Last Year: Never true    Ran Out of Food in the Last Year: Never true  Transportation Needs:  No Transportation Needs (12/20/2022)   PRAPARE - Administrator, Civil Service (Medical): No    Lack of Transportation (Non-Medical): No  Physical Activity: Inactive (02/21/2023)   Exercise Vital Sign    Days of Exercise per Week: 0 days    Minutes of Exercise per Session: 0 min  Stress: Stress Concern Present (02/21/2023)   [employer] of Occupational Health - Occupational Stress Questionnaire    Feeling of Stress : To some extent  Social Connections:  Moderately Isolated (12/20/2022)   Social Connection and Isolation Panel [NHANES]    Frequency of Communication with Friends and Family: More than three times a week    Frequency of Social Gatherings with Friends and Family: Never    Attends Religious Services: Never    Database administrator or Organizations: No    Attends Engineer, structural: Not on file    Marital Status: Married  Catering manager Violence: Not At Risk (03/02/2022)   Humiliation, Afraid, Rape, and Kick questionnaire    Fear of Current or Ex-Partner: No    Emotionally Abused: No    Physically Abused: No    Sexually Abused: No   Family Status  Relation Name Status   Mother  Deceased at age 50   Father  Deceased at age 68   Sister  Alive   Sister  Alive   Sister  Deceased  No partnership data on file   Family History  Problem Relation Age of Onset   Heart disease Mother        CHF   Bipolar disorder Mother    Heart disease Father        CHF   Allergies  Allergen Reactions   Bee Venom Anaphylaxis   Strawberry Extract Hives   Latex Itching   Zetia [Ezetimibe] Other (See Comments)    Intolerance    Adhesive [Tape] Other (See Comments)    blisters   Statins Other (See Comments)    myalgias      Review of Systems  Constitutional:  Negative for chills, fever and malaise/fatigue.  HENT:  Negative for congestion and hearing loss.   Eyes:  Negative for blurred vision and discharge.  Respiratory:  Negative for cough, sputum  production and shortness of breath.   Cardiovascular:  Negative for chest pain, palpitations and leg swelling.  Gastrointestinal:  Negative for abdominal pain, blood in stool, constipation, diarrhea, heartburn, nausea and vomiting.  Genitourinary:  Negative for dysuria, frequency, hematuria and urgency.  Musculoskeletal:  Negative for back pain, falls and myalgias.  Skin:  Negative for rash.  Neurological:  Negative for dizziness, sensory change, loss of consciousness, weakness and headaches.  Endo/Heme/Allergies:  Negative for environmental allergies. Does not bruise/bleed easily.  Psychiatric/Behavioral:  Negative for depression and suicidal ideas. The patient is not nervous/anxious and does not have insomnia.       Objective:     BP (!) 140/80 (BP Location: Right Arm, Patient Position: Sitting, Cuff Size: Large)   Pulse 63   Temp 97.6 F (36.4 C) (Oral)   Resp 18   Ht 5\' 9"  (1.753 m)   Wt 161 lb 9.6 oz (73.3 kg)   SpO2 99%   BMI 23.86 kg/m  BP Readings from Last 3 Encounters:  02/24/23 (!) 140/80  02/23/23 (!) 142/82  01/17/23 (!) 139/105   Wt Readings from Last 3 Encounters:  02/24/23 161 lb 9.6 oz (73.3 kg)  01/17/23 171 lb 15.3 oz (78 kg)  12/21/22 171 lb 9.6 oz (77.8 kg)   SpO2 Readings from Last 3 Encounters:  02/24/23 99%  02/23/23 95%  01/17/23 100%      Physical Exam Vitals and nursing note reviewed.  Constitutional:      General: He is not in acute distress.    Appearance: Normal appearance. He is well-developed.  HENT:     Head: Normocephalic and atraumatic.  Eyes:     General: No scleral icterus.       Right eye: No  discharge.        Left eye: No discharge.  Cardiovascular:     Rate and Rhythm: Normal rate and regular rhythm.     Heart sounds: No murmur heard. Pulmonary:     Effort: Pulmonary effort is normal. No respiratory distress.     Breath sounds: Normal breath sounds.  Musculoskeletal:        General: Normal range of motion.      Cervical back: Normal range of motion and neck supple.     Right lower leg: No edema.     Left lower leg: No edema.  Skin:    General: Skin is warm and dry.          Comments: Laceration L elbow about 3/4 in--- clean, abx ointment and bandaid put in place  Skin tear R forearm  Neurological:     Mental Status: He is alert and oriented to person, place, and time.  Psychiatric:        Mood and Affect: Mood normal.        Behavior: Behavior normal.        Thought Content: Thought content normal.        Judgment: Judgment normal.      No results found for any visits on 02/24/23.  Last CBC Lab Results  Component Value Date   WBC 10.6 (H) 01/17/2023   HGB 12.6 (L) 01/17/2023   HCT 38.5 (L) 01/17/2023   MCV 100.3 (H) 01/17/2023   MCH 32.8 01/17/2023   RDW 13.2 01/17/2023   PLT 201 01/17/2023   Last metabolic panel Lab Results  Component Value Date   GLUCOSE 147 (H) 01/17/2023   NA 141 01/17/2023   K 4.9 01/17/2023   CL 106 01/17/2023   CO2 26 01/17/2023   BUN 34 (H) 01/17/2023   CREATININE 1.94 (H) 01/17/2023   GFRNONAA 34 (L) 01/17/2023   CALCIUM 9.1 01/17/2023   PHOS 3.4 03/03/2022   PROT 7.4 01/17/2023   ALBUMIN 3.9 01/17/2023   LABGLOB 3.2 04/25/2019   AGRATIO 1.3 04/25/2019   BILITOT 0.2 (L) 01/17/2023   ALKPHOS 144 (H) 01/17/2023   AST 13 (L) 01/17/2023   ALT 6 01/17/2023   ANIONGAP 9 01/17/2023   Last lipids Lab Results  Component Value Date   CHOL 216 (H) 06/15/2022   HDL 50.70 06/15/2022   LDLCALC 137 (H) 06/15/2022   TRIG 138.0 06/15/2022   CHOLHDL 4 06/15/2022   Last hemoglobin A1c Lab Results  Component Value Date   HGBA1C 5.3 07/27/2021   Last thyroid functions Lab Results  Component Value Date   TSH 0.836 02/27/2022   Last vitamin D Lab Results  Component Value Date   VD25OH 46.2 05/18/2022   Last vitamin B12 and Folate Lab Results  Component Value Date   VITAMINB12 324 03/01/2022   FOLATE 7.0 03/01/2022    The ASCVD Risk  score (Arnett DK, et al., 2019) failed to calculate for the following reasons:   The 2019 ASCVD risk score is only valid for ages 27 to 46   The patient has a prior MI or stroke diagnosis    Assessment & Plan:   Problem List Items Addressed This Visit       Unprioritized   Insomnia - Primary   Relevant Medications   traZODone (DESYREL) 150 MG tablet   Hyperlipidemia   Relevant Orders   CBC with Differential/Platelet   Comprehensive metabolic panel   Lipid panel   TSH   HTN (hypertension)  Relevant Orders   CBC with Differential/Platelet   Comprehensive metabolic panel   Lipid panel   TSH   CVA (cerebral vascular accident) (HCC)   Major vascular neurocognitive disorder   Relevant Medications   traZODone (DESYREL) 150 MG tablet   Lung nodules   Relevant Orders   CT CHEST WO CONTRAST   Laceration of right upper extremity    Skin tear-----  no bleeding today Abx oinment and bandaid placed       Dermatitis   Relevant Orders   Ambulatory referral to Dermatology   Other Visit Diagnoses     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 No plan to hold Plavix as bleeding was eventually controlled. -Continue Plavix as prescribed. -Apply antibiotic ointment and bandage to current wounds.  Medication Refill Trazodone refill needs to be sent [facility] of [facility]. -Send Trazodone refill to [facility].  Lorazepam Prescription Patient requires more Lorazepam than currently prescribed due to dosing regimen. Pharmacy will only provide 90 pills, but patient requires 105 per month. -Discuss with Dr. Alwyn Ren about possible changes to prescription to accommodate patient's needs.  RSV Vaccine Patient has not received RSV vaccine. Recommended for patients over 60 or with certain  conditions. -Advise patient to request RSV vaccine at pharmacy.  Lung Nodules CT of abdomen revealed nodules on lungs. Unclear nature of nodules due to limitations of abdominal CT. -Refer patient to new dermatologist for further evaluation.  Chronic Cough Patient has long-standing cough that has recently worsened. -Monitor cough and consider further evaluation if it continues to worsen.  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 Current treatment not effective. -Refer patient to new dermatologist for further evaluation and treatment.        No follow-ups on file.    Donato Schultz, DO

## 2023-02-24 NOTE — Assessment & Plan Note (Signed)
Skin tear-----  no bleeding today Abx oinment and bandaid placed

## 2023-02-24 NOTE — Telephone Encounter (Signed)
Pt's wife called said a CT chest scan was ordered for pt but they are unable to help him out of the wheelchair and on to the table. Wife said they recommended [facility] or [facility]. Advised wife that order is placed for [facility] but she said that whoever called her advised a hospital setting so maybe that meant we needed to send order to [facility]. Please call her to advise.

## 2023-02-25 LAB — COMPREHENSIVE METABOLIC PANEL
ALT: 3 U/L (ref 0–53)
AST: 10 U/L (ref 0–37)
Albumin: 3.7 g/dL (ref 3.5–5.2)
Alkaline Phosphatase: 152 U/L — ABNORMAL HIGH (ref 39–117)
BUN: 27 mg/dL — ABNORMAL HIGH (ref 6–23)
CO2: 31 meq/L (ref 19–32)
Calcium: 8.8 mg/dL (ref 8.4–10.5)
Chloride: 104 meq/L (ref 96–112)
Creatinine, Ser: 1.89 mg/dL — ABNORMAL HIGH (ref 0.40–1.50)
GFR: 33.1 mL/min — ABNORMAL LOW (ref >60.00)
Glucose, Bld: 99 mg/dL (ref 70–99)
Potassium: 4.2 meq/L (ref 3.5–5.1)
Sodium: 142 meq/L (ref 135–145)
Total Bilirubin: 0.4 mg/dL (ref 0.2–1.2)
Total Protein: 6.5 g/dL (ref 6.0–8.3)

## 2023-02-25 LAB — CBC WITH DIFFERENTIAL/PLATELET
Basophils Absolute: 0.1 10*3/uL (ref 0.0–0.1)
Basophils Relative: 1 % (ref 0.0–3.0)
Eosinophils Absolute: 0.2 10*3/uL (ref 0.0–0.7)
Eosinophils Relative: 3.4 % (ref 0.0–5.0)
HCT: 36.8 % — ABNORMAL LOW (ref 39.0–52.0)
Hemoglobin: 12.2 g/dL — ABNORMAL LOW (ref 13.0–17.0)
Lymphocytes Relative: 21.1 % (ref 12.0–46.0)
Lymphs Abs: 1.1 10*3/uL (ref 0.7–4.0)
MCHC: 33.3 g/dL (ref 30.0–36.0)
MCV: 98.7 fl (ref 78.0–100.0)
Monocytes Absolute: 0.7 10*3/uL (ref 0.1–1.0)
Monocytes Relative: 12.9 % — ABNORMAL HIGH (ref 3.0–12.0)
Neutro Abs: 3.3 10*3/uL (ref 1.4–7.7)
Neutrophils Relative %: 61.6 % (ref 43.0–77.0)
Platelets: 183 10*3/uL (ref 150.0–400.0)
RBC: 3.73 Mil/uL — ABNORMAL LOW (ref 4.22–5.81)
RDW: 13.6 % (ref 11.5–15.5)
WBC: 5.4 10*3/uL (ref 4.0–10.5)

## 2023-02-25 LAB — LIPID PANEL
Cholesterol: 173 mg/dL (ref 0–200)
HDL: 47.6 mg/dL (ref >39.00)
LDL Cholesterol: 106 mg/dL — ABNORMAL HIGH (ref 0–99)
NonHDL: 124.97
Total CHOL/HDL Ratio: 4
Triglycerides: 95 mg/dL (ref 0.0–149.0)
VLDL: 19 mg/dL (ref 0.0–40.0)

## 2023-02-25 LAB — TSH: TSH: 0.53 u[IU]/mL (ref 0.35–5.50)

## 2023-02-25 NOTE — Telephone Encounter (Signed)
Imaging has been scheduled for 03/03/23

## 2023-03-01 ENCOUNTER — Ambulatory Visit: Payer: PPO | Admitting: Physical Therapy

## 2023-03-01 ENCOUNTER — Encounter: Payer: Self-pay | Admitting: Physical Therapy

## 2023-03-01 DIAGNOSIS — Z9181 History of falling: Secondary | ICD-10-CM

## 2023-03-01 DIAGNOSIS — M6281 Muscle weakness (generalized): Secondary | ICD-10-CM | POA: Diagnosis not present

## 2023-03-01 DIAGNOSIS — R262 Difficulty in walking, not elsewhere classified: Secondary | ICD-10-CM

## 2023-03-01 DIAGNOSIS — R2689 Other abnormalities of gait and mobility: Secondary | ICD-10-CM

## 2023-03-01 DIAGNOSIS — I69354 Hemiplegia and hemiparesis following cerebral infarction affecting left non-dominant side: Secondary | ICD-10-CM

## 2023-03-01 NOTE — Therapy (Signed)
OUTPATIENT PHYSICAL THERAPY NEURO TREATMENT NOTE  Patient Name: William [name] MRN: [id number] DOB:05/31/1941, 82 y.o., male Today's Date: 03/01/2023  PCP: Donato Schultz  DO REFERRING PROVIDER: same   END OF SESSION:  PT End of Session - 03/01/23 1457     Visit Number 52    Number of Visits 66    Date for PT Re-Evaluation 04/22/23    Authorization Type HTA    PT Start Time 1445    PT Stop Time 1530    PT Time Calculation (min) 45 min    Equipment Utilized During Treatment Gait belt    Activity Tolerance Patient tolerated treatment well    Behavior During Therapy WFL for tasks assessed/performed              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   HTN (hypertension) 07/19/2015   Hyperlipidemia    Major neurocognitive disorder 01/09/2014   Mild, related to stroke history   Nocturia    Renal insufficiency 06/25/2013   S/P carotid endarterectomy    BILATERAL ICA--  PATENT PER DUPLEX  05-19-2012   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   Right calf   Urinary frequency    Vitamin D deficiency    Past Surgical History:  Procedure Laterality Date   APPENDECTOMY  AS CHILD   CARDIOVASCULAR STRESS TEST  03-27-2012  DR CRENSHAW   LOW RISK LEXISCAN STUDY-- PROBABLE NORMAL PERFUSION AND SOFT TISSUE ATTENUATION/  NO ISCHEMIA/ EF 51%   CAROTID ENDARTERECTOMY Bilateral LEFT  11-12-2008  DR GREG HAYES   RIGHT ICA  2006  ([facility])   CHOLECYSTECTOMY N/A 02/23/2022   Procedure: LAPAROSCOPIC CHOLECYSTECTOMY;  Surgeon: Quentin Ore, MD;   Location: [facility] OR;  Service: General;  Laterality: N/A;   CYSTOSCOPY W/ RETROGRADES Bilateral 06/22/2021   Procedure: CYSTOSCOPY WITH RETROGRADE PYELOGRAM;  Surgeon: Marcine Matar, MD;  Location: [facility];  Service: Urology;  Laterality: Bilateral;   CYSTOSCOPY WITH LITHOLAPAXY N/A 02/26/2013   Procedure: CYSTOSCOPY WITH LITHOLAPAXY;  Surgeon: Marcine Matar, MD;  Location: [facility];  Service: Urology;  Laterality: N/A;   ENDOSCOPIC RETROGRADE CHOLANGIOPANCREATOGRAPHY (ERCP) WITH PROPOFOL N/A 02/22/2022   Procedure: ENDOSCOPIC RETROGRADE CHOLANGIOPANCREATOGRAPHY (ERCP) WITH PROPOFOL;  Surgeon: Jeani Hawking, MD;  Location: [facility] ENDOSCOPY;  Service: Gastroenterology;  Laterality: N/A;   EYE SURGERY  Jan. 2016   cataract surgery both eyes   INGUINAL HERNIA REPAIR Right 11-08-2006   IR KYPHO EA ADDL LEVEL THORACIC OR LUMBAR  02/12/2021   IR RADIOLOGIST EVAL & MGMT  02/18/2021   MASS EXCISION N/A 03/03/2016   Procedure: EXCISION OF BACK  MASS;  Surgeon: Almond Lint, MD;  Location: [facility];  Service: General;  Laterality: N/A;   MOHS SURGERY Left 1/ 2016   Dr Margo Aye-- Basal cell   PROSTATE SURGERY     REMOVAL OF STONES  02/22/2022   Procedure: REMOVAL OF STONES;  Surgeon: Jeani Hawking, MD;  Location: [facility] ENDOSCOPY;  Service: Gastroenterology;;  SPHINCTEROTOMY  02/22/2022   Procedure: SPHINCTEROTOMY;  Surgeon: Jeani Hawking, MD;  Location: [facility] ENDOSCOPY;  Service: Gastroenterology;;   TRANSURETHRAL RESECTION OF BLADDER TUMOR WITH MITOMYCIN-C N/A 06/22/2021   Procedure: TRANSURETHRAL RESECTION OF BLADDER TUMOR;  Surgeon: Marcine Matar, MD;  Location: [facility];  Service: Urology;  Laterality: N/A;   TRANSURETHRAL RESECTION OF PROSTATE N/A 02/26/2013   Procedure: TRANSURETHRAL RESECTION OF THE PROSTATE WITH GYRUS INSTRUMENTS;  Surgeon: Marcine Matar, MD;  Location: [facility];  Service: Urology;  Laterality: N/A;    TRANSURETHRAL RESECTION OF PROSTATE N/A 06/22/2021   Procedure: TRANSURETHRAL RESECTION OF THE PROSTATE (TURP);  Surgeon: Marcine Matar, MD;  Location: [facility];  Service: Urology;  Laterality: N/A;   Patient Active Problem List   Diagnosis Date Noted   Laceration of right upper extremity 02/24/2023   Dermatitis 02/24/2023   Lung nodules 02/24/2023   Dysuria 08/03/2022   Nonintractable headache 07/01/2022   Bilateral impacted cerumen 06/24/2022   Rash 06/15/2022   Postoperative ileus (HCC) 02/27/2022   Ileus, postoperative (HCC) 02/26/2022   Choledocholithiasis 02/19/2022   DNR (do not resuscitate) 02/19/2022   Left elbow pain 01/26/2022   Mid back pain on left side 01/26/2022   Rib pain 01/26/2022   Acute pain of left shoulder 11/12/2021   Leukocytes in urine 11/12/2021   Urinary frequency 11/12/2021   Thrush 10/08/2021   Hemiplegia, 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   Insomnia    Prediabetes    Acute 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 (HCC)    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   Transaminitis 07/27/2021   UTI (urinary tract infection) 07/27/2021   CVA (cerebral vascular accident) (HCC) 07/27/2021   Fall 07/27/2021   Hyperglycemia 07/27/2021   Cholelithiasis 07/27/2021   Hypoxia 07/27/2021   Nausea and vomiting 07/27/2021   Acute metabolic encephalopathy 07/27/2021   Normocytic anemia 07/27/2021   Chronic back pain 07/27/2021   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   Closed fracture of first lumbar vertebra with routine healing 02/03/2021   Closed fracture of multiple ribs 11/18/2020   Anxiety 01/29/2020   Leg pain, bilateral 01/29/2020   Ingrown toenail 07/13/2019   Lumbar spondylosis 05/02/2018   Pain in left knee 03/09/2018   Osteoarthritis of left hip 01/16/2018   Trochanteric bursitis of left hip 01/16/2018   Preventative health care 09/26/2017    HTN (hypertension) 07/19/2015   Hyperlipidemia 07/19/2015   Great toe pain 02/11/2014   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   Obesity (BMI 30-39.9) 06/25/2013   Renal insufficiency 06/25/2013   Weakness of left arm 06/25/2013   Sebaceous cyst 03/03/2011   Sprain of lumbar region 07/31/2010   Rib pain, left 08/29/2009   Carotid artery stenosis, asymptomatic, bilateral 05/02/2009   Eczema, atopic 05/31/2008   Vitamin D deficiency 03/01/2008   BPH (benign prostatic hyperplasia) 08/06/2007   Fasting hyperglycemia 12/21/2006   History of right MCA infarct 2006    ONSET DATE: 06/29/22 REFERRING DIAG:  Diagnosis  I63.9 (ICD-10-CM) - Cerebrovascular accident (CVA), unspecified mechanism (HCC)  W19.XXXD (ICD-10-CM) - Fall, subsequent encounter    THERAPY DIAG:  Muscle weakness (generalized)  Other abnormalities of gait and mobility  Hemiplegia and hemiparesis following cerebral infarction affecting left non-dominant side (HCC)  History of falling  Difficulty in walking, not elsewhere classified  Rationale for Evaluation and Treatment: Rehabilitation  SUBJECTIVE:  SUBJECTIVE STATEMENT:    Reports had a good weekend, good rest, no falls  Per spouse: yesterday was the first day for the bus for [facility], left early and didn't get back until 4:30pm so missed some medicines, he goes again tomorrow and we will keep that from happening again. MD is working with [place] for orders for [facility] to have him up and moving regularly, noticed he was more tired yesterday and today   PERTINENT HISTORY:  TAGG PEREZGARCIA is a 82 year old man with dementia, CKD, HTN, CAD, HLD and history of CVA  (2006, 2023), Parkinson's  PAIN:  Are you having pain? Faces/behavior scale- mild pain BLEs    PRECAUTIONS: Fall  WEIGHT BEARING RESTRICTIONS: No  FALLS: Has patient fallen in last 6 months? No  LIVING ENVIRONMENT: Lives with: lives with their family and lives with their spouse Lives in: House/apartment Stairs:  1 brick high step Has following equipment at home: Environmental consultant - 2 wheeled, Wheelchair (manual), Shower bench, bed side commode, Grab bars, and hospital bed, sliding pad for car  PLOF: Independent with basic ADLs and Independent with household mobility without device  PATIENT GOALS: Walk around the block with his wife. Be able to use the dining room chair rather than have to use the W/C. Increased I with bed mobility and simple tasks at home like make some coffee or brush his teeth, Step over tub to shower, has bench, but he can't really use it. Up and down the one step to get to back deck. 11/23/22: Patient's wife [name] updated his goals today: Walk around store (gets distracted), swing a golf club again (thinking of driving range), get up on his own, walk without leaning forward, more recognition of the left side (from CVA), not need gait belt, OT-- be able to get dressed more on his own, navigate the newspaper.  OBJECTIVE:  TREATMENT: 03/01/23 Nustep level 5 x 6 minutes UBE level 4 x 4 minutes Marches Hip abduction Step reach, turn [employer], [employer] Gait fast walking HHA 2x 220 feet Seated PWR moves   TODAY'S TREATMENT 02/22/23  Nustep L3 x6 minutes (decreased resistance due to Keachi reporting some BLE pain this session) intermittent MinA for amplitude of movement  Seated PWR moves (PWR up, reach, rotation, step) with cognitive boosts  Standing PWR moves- lateral reaches  with visual boost with cones, multimodal cues for use of LUE    TODAY'S TREATMENT 02/16/23  Nustep L4x8 minutes cues for big motions all extremities  Walking outside over grass with cues for big steps and foot clearance/step length, slowing pace  MinAx2 for sequencing/initiating transfer off  of circular picnic table (turning left and then standing instead of standing up with legs blocked by table) Reaching down to floor to tap cone for wt shift, then up to center to toss ball with focus on upright posture/trunk stretch Alternating seated marches with focus on foot clearance Max cues for foot clearance STS no UEs x4   PATIENT EDUCATION: Education details: none new 02/16/23 Person educated: Patient and Spouse Education method: Explanation, Demonstration, Tactile cues, and Verbal cues Education comprehension: verbalized understanding, returned demonstration, verbal cues required, tactile cues required, and needs further education   ------------------------------------------------------------------------------------------------------- (Measures in this section from initial evaluation unless otherwise noted) DIAGNOSTIC FINDINGS:  MRI 2021 with degenerative changes in lumbar spine, L3-4 R subarticular stenosis COGNITION: Overall cognitive status: History of cognitive impairments - at baseline SENSATION: Not tested COORDINATION: Moderately impaired BLE MUSCLE LENGTH: Hamstrings: severely restricted  B Thomas test: Severely restricted B. POSTURE: rounded shoulders, forward head, increased thoracic kyphosis, posterior pelvic tilt, and flexed trunk  LOWER EXTREMITY ROM:   BLE extremely stiff throughout, limited hip ROM in all planes, B knees limited in extension. LOWER EXTREMITY MMT:  3+/5 throughout. Unable to determine accuracy of MMT due to cognition. Functionally demosntrates poor coordinated activation and poor muscular endurance. BED MOBILITY: Min A for Supine<> Sit. Patient was using a ladder in his hospital bed and could move MI. TRANSFERS: Min A, mod VC, tends to lean back.  11/02/22 independent with use of hands on arm chair with a couple of tries CURB: Min A-Has one step out to his deck. STAIRS: N/A  GAIT: Gait pattern: step to pattern, decreased arm swing- Right, decreased  arm swing- Left, decreased step length- Right, decreased step length- Left, shuffling, festinating, trunk flexed, narrow BOS, poor foot clearance- Right, and poor foot clearance- Left Distance walked: 29' Assistive device utilized: Environmental consultant - 2 wheeled Level of assistance: Occasional min A Comments: mod VC to stay close to walker, take longer steps. FUNCTIONAL TESTS:  5 times sit to stand: 38.23  09/29/22  could not performs 5XSTS, 11/02/22 = 47 seconds with cues to use arms Timed up and go (TUG): 47.91  with RW, 07/21/22 TUG 29  seconds without device, 09/29/22 25 seconds, 11/02/22 TUG 20 seconds                                                                     PATIENT EDUCATION: Education details: Progress towards goals, POC Person educated: Patient and Spouse Education method: Programmer, multimedia, Facilities manager, and Verbal cues  Education comprehension: verbalized understanding, returned demonstration, verbal cues required, and needs further education  HOME EXERCISE PROGRAM: PWR! moves  GOALS: Goals reviewed with patient? Yes  SHORT TERM GOALS: Target date: 12/22/22  Perform HEP for PWR! Moves with cues from family. Baseline: Pt's wife, [name], notes walking and hitting beach ball as main HEP. Goal status: GOAL MET, 12/20/2022   2. Decrease 5 x STS to < 35 sec to demonstrate improved functional strength  Baseline: 47 seconds on 5/21 per note; 34.5 sec UE support 12/20/2022  Goal status: GOAL MET, 12/20/2022   3. Perform sit <> stand transfers from average chair height with Supervision.  Baseline: CGA  12/20/2022; (01/10/23) CGA  Goal status: NOT MET  LONG TERM GOALS: Target date: 01/22/23  Patient will perform HEP progression with caregiver assist. Baseline: has minimal HEP Goal status: ONGOING  2.  Decrease 5 x STS to < 25 sec to demonstrate improved functional strength Baseline: 47 seconds on 5/21 per note; (01/10/23) 36 sec w/ cues for speed/sequence>30.38 sec with BUE supprot Goal status:  PARTIALLY MET, 01/24/2023  3. Improve quality of gait to be able to ambulate x 100 feet with supervision. Baseline: CGA with cues  Goal status: REVISED  4.  Patient will perform his bed mobility with MI including rolling, sit<>supine, and scooting in bed. Baseline: on hard surface mat he is independent but on soft bed at home he struggles, wife reports only needs a hand to get up now 11/02/22; (01/10/23) modified indep.  Goal status: MET  5.  Patient will demonstrate floor<>stand with CGA and verbal cues for patient's wife to  be able to assist. Baseline:  Has to call 9-1-1; +2 max assist floor>stand with UE support 01/19/2023 Goal status: GOAL NOT MET 01/19/2023 **UPDATED GOALS BELOW ** per recert 01/24/2023  SHORT TERM GOALS: Target date: 02/25/2023   Pt will perform HEP to maintain lower body strength, flexibility, posture with wife's assistance. Baseline:  Goal status: INITIAL   2. Pt will perform sit to stand, 8 of 10 reps with BUE support, with supervision, to maintain safety with transfers.  Baseline: min guard  Goal status: 02/07/23-CGA from elevated surface and max VC to lean forward, ongoing   LONG TERM GOALS: Target date: 04/22/2023   Patient will perform HEP progression to maintain lower body strength, flexibility, posture with wife's assistance. Baseline:  Goal status: INITIAL  2.  Pt will ambulate 150-175 ft in 2 MWT with min guard assist, to preserve functional mobility within the home.  Baseline: Goal status:  INITIAL  3. Wife will verbalize tips for fall prevention education to minimize fall frequency Baseline:  Goal status: INITIAL   ASSESSMENT:  CLINICAL IMPRESSION:   William [name] has been doing well according to his wife, no falls, reports that he gets very fatigued with going to [facility].  I have not seen him in a number of months, I know that he has changed some medications but I feel that he is less distracted today and able to use the left hand when I asked him to do  this, where as previously he would neglect the left side and not use it even when asked.  OBJECTIVE IMPAIRMENTS: Abnormal gait, decreased activity tolerance, decreased balance, decreased cognition, decreased coordination, decreased endurance, decreased mobility, difficulty walking, decreased ROM, decreased strength, decreased safety awareness, impaired flexibility, impaired UE functional use, improper body mechanics, and postural dysfunction.   PLAN:  PT FREQUENCY: 1x/week  PT DURATION: 12 weeks  PLANNED INTERVENTIONS: Therapeutic exercises, Therapeutic activity, Neuromuscular re-education, Balance training, Gait training, Patient/Family education, Self Care, Joint mobilization, Stair training, Moist heat, and Manual therapy  PLAN FOR NEXT SESSION:   Continue exercises for lower body strength, sit<>Stand and walking for exercise.  Check about [employer] as part of his routine.   Stacie Glaze, PT 03/01/23 2:58 PM  [facility] [address] [place], [place], [address] Phone: [phone]   Fax:  [phone]

## 2023-03-02 NOTE — Progress Notes (Signed)
NEUROLOGY FOLLOW UP OFFICE NOTE  William [name] [id number]  Assessment/Plan:     1.  Parkinson's disease 2.   Major vascular neurocognitive disorder, but cannot rule out underlying dementia secondary to Parkinson's disease as well    Increase carbidopa-levodopa IR 25/100mg  to 1.5 tablest three times daily Continue home exercises and maintenance PT once a week Follow up in 6 months.   Subjective:  William [name] is a 82 year old man with dementia, CKD, HTN, CAD, HLD and history of CVA who follows up for vascular dementia, cerebrovascular disease with history of stroke and parkinsonism.  UPDATE: Current medications:  Carbidopa-levodopa IR 25/100mg  1 tablet three times daily, ASA 81mg , Plavix 75mg ; Norvasc, hydralazine, fenofibrate 160mg , Tegretol-XR 100mg  at bedtime; amantadine 100mg  daily, lorazepam PRN; tizanidine 4mg  Q6h PRN, trazodone 75mg  at bedtime  Takes C-L at 9-10 AM, 12-2 PM and 5-6 PM.  He and his wife have noticed positive results.  He has had less freezing.  Walking has improved.  Talking has improved.  Stuttering has ceased.  Symptoms improve about an hour after he takes a dose.  He will tend to stutter more in the early evening when he gets home from [facility] at [facility].  They give him his afternoon dose at 12PM.  He wakes up at 7:30 - 8 AM but doesn't take the C-L right away because it makes him nauseous.  He will eat some breakfast first.  He went to physical therapy for a couple of months and currently goes to PT once a week at [facility] for maintenance.  He stretches everyday.  Sleep is good.     HISTORY: 1.  Lumbar spondylosis with radiculopathy Prior to COVID, he was going to the gym and seeing a personal trainer but it stopped once the pandemic hit.  Since the pandemic, he has been sedentary.  He walks the dog once or twice a week.  He started having bilateral leg pain later that year in 2020.  No associated back pain.  No shooting pain.  It is described as an  aching in his calves.  No associated numbness.  He also reports weakness.  When he walks, he has trouble picking up his legs.  He seems to shuffle his feet.  When he gets up to stand, he feels like he can fall forward.  He had one fall while standing at the toilet and he bent forward to spit in toilet.  No change in bowel habit.  He was having urinary issues so he was taken off of Flomax.    Vascular US ABI from 12/08/18 showed abnormal toe-brachial index in both lower extremities but otherwise unremarkable.  MRI of cervical spine without contrast from 03/10/2019 showed some cervical degenerative disc disease and spondylosis with mild foraminal narrowing but no spinal stenosis or compressive myelopathy.  He does have history of low back pain and lumbar spondylosis.  Due to ongoing unsteady gait with bilateral leg pain, MRI of lumbar spine was performed on 11/14/2019, which was personally reviewed and demonstrated degenerative changes stable compared to 2012 with L3-4 right subarticular stenosis.  He has had mixed response to epidural injections.     2.  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  He has some residual left sided weakness and facial weakness.  He underwent  left carotid endarterectomy in 2010 for asymptomatic stenosis.   Beginning around 2013, he and his wife have noted a gradual progression and cognitive deficits. Onset correlated during a period when he was experiencing dizziness and syncope secondary to orthostatic hypotension. At that time, he was dehydrated and not drinking much fluids. He was instructed to start increasing his fluid intake as well as his sodium intake. Now, he tries to drink 3 bottles of water per day. He particularly notes confusion regarding how to perform certain everyday tasks. For  example, it takes him a long time to unload the dishwasher because he has trouble figuring out where to put the dishes. He use to enjoy cooking and preparing meals. Now he has difficulty cooking and can only see things up in the microwave. He also has been experiencing dressing apraxia. Sometimes he will put both his feet into one leg of his pants. Other times, he has difficulty buttoning his shirts. He denies any language dysfunction such as difficulty understanding other people or other people not understanding him. He has no trouble reading or writing. His short-term and long-term memory are pretty good. He denies any difficulty with face recognition. He denies hallucinations or delusions. He has not had any change in his personality or behavior. He still drives but very seldom and only locally. He has not had any problems with accidents or near accidents, but he says he drives slowly because he is very nervous and cautious. He does have history of anxiety and often shakes when he gets nervous. He denies any family history of dementia. However, there is a psychiatric history with his mother and sister.  He denies history of alcohol abuse or drug use.   He underwent neuropsychological testing on 10/25/13 with Dr. Elvis Coil at [facility].  Testing suggested mild dementia due to his stroke, but also underlying neurodegenerative process.  Memory and left hemispheric medial temporal functions were intact.  Findings showed deficits in visual processing and executive dysfunction.  Onset of visual-spatial deficits and apraxia may suggest occipital lobe involvement, such as due to an Alzheimer's variant called posterior cortical atrophy.  MRI from May 2015 revealed global atrophy but it does not seem more prominent in the occipital lobes.  He developed increased confusion in February 2021.  At physical therapy, he was noted to have increased difficulty using his left side (affected side from his stroke).  He had  difficulty performing finger-thumb tapping and had trouble gripping the handle of the exercise machine with his left hand.  He also has had acute cognitive changes.  He started having trouble dialing his daughter's phone number which is new.  MRI of brain without contrast performed on 08/15/2019 was personally reviewed and showed remote large right hemispheric stroke and chronic small vessel ischemic changes, stable compared to prior MRI from 2015, no acute intracranial abnormality.   He underwent repeat neuropsychological testing on 11/29/2019 which demonstrated findings primarily consistent with major vascular neurocognitive disorder, relatively stable compared to prior testing in 2015.  Underlying neurodegenerative disorder could not be ruled out but clinically has been stable.    In February 2023, he became more confused.  Found to have UTI as well as acute infarct in left basal ganglia and corona radiata as well as severe distal left M1 MCA stenosis and moderate right ICA stenosis.  Discharged on ASA and Plavix for 3 months followed by Plavix monotherapy.  Following discharge, continued to be confused.  Mistakes wife with his ex-wife.  Incontinent.  Baseline hemineglect worse.    DaT scan on 08/05/2022 was equivocal - revealed marked decreased activity in the left striatum, however it correlates with large left basal ganglia infarct as seen on comparison CT.   Prior medication:  Aricept (diarrhea, vivid dreams); galantamine (nausea)   3.  Headache: In February 2022, he reported new moderate non-throbbing headache back of head every night.  Ongoing for two months.  No neck pain.  It occurs in the evening while sitting talking or watching TV.  Treats with Tylenol almost everyday.  Lasts a half hour.  No change in medication just prior to onset.  Similar headache when he had his stroke.  MRI of brain without contrast on 08/14/2020 showed no new acute/subacute intracranial abnormality.  Headaches resolved.      01/30/20 Carotid doppler:  1-39% bilateral ICA stenosis. 10/16/13 LABS:  B12 720, TSH 1.668 10/19/13 MRI BRAIN WO:  stable remote large right MCA territory infarct.  Chronic microvascular ischemic changes. 05/21/13 LABS:  Hgb A1c 6 05/17/13 Carotid doppler: bilateral 1-39% stenosis of the right proximal ICA and no stenosis of the left ICA. 03/11/12 MRI Brain wo contrast:  Remote large right MCA infarct. Dementia with behavioral symptoms.  Etiology unclear.  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   HTN (hypertension) 07/19/2015   Hyperlipidemia    Major neurocognitive disorder 01/09/2014   Mild, related to stroke history   Nocturia    Renal insufficiency 06/25/2013   S/P carotid endarterectomy    BILATERAL ICA--  PATENT PER DUPLEX  05-19-2012   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     amantadine (SYMMETREL) 100 MG capsule Take 1 capsule (100 mg total) by mouth daily. 90 capsule 1   amLODipine (NORVASC) 10 MG tablet Take 1 tablet (10 mg total) by mouth every morning. 90 tablet 1   carBAMazepine (TEGRETOL) 100  MG/5ML suspension Take 5 mLs (100 mg total) by mouth in the morning and in the afternoon, and then 10 mLs (200 mg total) at bedtime. 600 mL 3   carbidopa-levodopa (SINEMET IR) 25-100 MG tablet Take 1/2 tablet twice daily for one week, then 1/2 tablet three times daily for one week, then 1 tablet three times daily. 90 tablet 5   Cholecalciferol (VITAMIN D3) 25 MCG (1000 UT) CHEW Chew 1 tablet by mouth daily.     clobetasol cream (TEMOVATE) 0.05 % Apply 1 Application to affected areas topically up to 2 (two) times daily as needed. Do not apply to face, groin, or under arms 60 g 3  clobetasol cream (TEMOVATE) 0.05 % Apply 1 application topically to affected area(s) up to 2 (two) times daily as needed. Do not apply to face, groin, or underarms. 60 g 3   clopidogrel (PLAVIX) 75 MG tablet Take 1 tablet (75 mg total) by mouth daily. 90 tablet 1   clotrimazole-betamethasone (LOTRISONE) cream Apply 1 Application on to the skin daily. 30 g 0   ferrous sulfate 325 (65 FE) MG EC tablet Take 325 mg by mouth 2 (two) times daily. *Crushed*     fluticasone (CUTIVATE) 0.05 % cream 1 application  daily as needed (psoriasis).  3   fluticasone (FLONASE) 50 MCG/ACT nasal spray Place 1 spray into both nostrils daily. 15.8 mL 0   hydrALAZINE (APRESOLINE) 25 MG tablet Take 1 tablet (25 mg total) by mouth every 8 (eight) hours. 270 tablet 1   LORazepam (ATIVAN) 0.5 MG tablet Take 0.5 tablet (0.25 mg total) by mouth in the morning, then 1 tablet (0.5 mg total) by mouth in the afternoon, and the 2 tablets (2 mg total) by mouth at bedtime. 105 tablet 4   Multiple Vitamins-Minerals (ADULT ONE DAILY GUMMIES) CHEW Chew 1 tablet by mouth every morning.     ofloxacin (FLOXIN) 0.3 % OTIC solution Place 10 drops into both ears daily. 5 mL 0   pantoprazole (PROTONIX) 40 MG tablet Take 1 tablet (40 mg total) by mouth daily. 90 tablet 3   polyethylene glycol (MIRALAX / GLYCOLAX) 17 g packet Take 17 g by mouth daily. 14 each 0    tamsulosin (FLOMAX) 0.4 MG CAPS capsule Take 1 capsule (0.4 mg total) by mouth daily after supper. 90 capsule 1   tiZANidine (ZANAFLEX) 4 MG tablet Take 1 tablet (4 mg total) by mouth every 6 (six) hours as needed for muscle spasms. 30 tablet 0   traZODone (DESYREL) 150 MG tablet Take 0.5 tablets (75 mg total) by mouth at bedtime. *Crushed* 20 tablet 1   triamcinolone cream (KENALOG) 0.1 % Apply 1 application topically 2 (two) times daily. 30 g 0   Current Facility-Administered Medications on File Prior to Visit  Medication Dose Route Frequency Provider Last Rate Last Admin   gemcitabine (GEMZAR) chemo syringe for bladder instillation 2,000 mg  2,000 mg Bladder Instillation Once Marcine Matar, MD        ALLERGIES: Allergies  Allergen Reactions   Bee Venom Anaphylaxis   Strawberry Extract Hives   Latex Itching   Zetia [Ezetimibe] Other (See Comments)    Intolerance    Adhesive [Tape] Other (See Comments)    blisters   Statins Other (See Comments)    myalgias    FAMILY HISTORY: Family History  Problem Relation Age of Onset   Heart disease Mother        CHF   Bipolar disorder Mother    Heart disease Father        CHF      Objective:  Blood pressure 132/82, pulse 74, resp. rate 20, height 5\' 9"  (1.753 m), SpO2 99%. General: No acute distress.  Patient appears frail  Head:  Normocephalic/atraumatic Eyes:  Fundi examined but not visualized Neck: supple, no paraspinal tenderness, full range of motion Heart:  Regular rate and rhythm Neurological Exam:  Alert and oriented. Hypomimia.  Limited tracking with eye movements.  Hypophonia.  Increased tone in wrists and elbows, particularly left upper extremity.  Muscle strength 3/5 left upper extremity, otherwise 4/5 throughout.  Resting tremor in hands.  Sensation to light touch intact.  Deep tendon  reflexes 3+ throughout.  Pushed up from wheelchair to stand.  No freezing.  Quick gait with short stride.  En bloc turn.  Reduced left  arm swing.     Shon Millet, DO  CC: Seabron Spates, DO

## 2023-03-03 ENCOUNTER — Ambulatory Visit (HOSPITAL_COMMUNITY)
Admission: RE | Admit: 2023-03-03 | Discharge: 2023-03-03 | Disposition: A | Discharge status: Home / Self Care | Payer: PPO | Source: Ambulatory Visit | Attending: Family Medicine | Admitting: Family Medicine

## 2023-03-03 DIAGNOSIS — J984 Other disorders of lung: Secondary | ICD-10-CM | POA: Diagnosis not present

## 2023-03-03 DIAGNOSIS — R053 Chronic cough: Secondary | ICD-10-CM | POA: Diagnosis not present

## 2023-03-03 DIAGNOSIS — R918 Other nonspecific abnormal finding of lung field: Secondary | ICD-10-CM

## 2023-03-04 ENCOUNTER — Other Ambulatory Visit (HOSPITAL_BASED_OUTPATIENT_CLINIC_OR_DEPARTMENT_OTHER): Payer: Self-pay

## 2023-03-04 ENCOUNTER — Encounter: Payer: Self-pay | Admitting: Neurology

## 2023-03-04 ENCOUNTER — Ambulatory Visit: Payer: PPO | Admitting: Neurology

## 2023-03-04 VITALS — BP 132/82 | HR 74 | Resp 20 | Ht 69.0 in

## 2023-03-04 DIAGNOSIS — F0280 Dementia in other diseases classified elsewhere without behavioral disturbance: Secondary | ICD-10-CM | POA: Diagnosis not present

## 2023-03-04 DIAGNOSIS — G20A1 Parkinson's disease without dyskinesia, without mention of fluctuations: Secondary | ICD-10-CM | POA: Diagnosis not present

## 2023-03-04 MED ORDER — CARBIDOPA-LEVODOPA 25-100 MG PO TABS
1.5000 | ORAL_TABLET | Freq: Three times a day (TID) | ORAL | 5 refills | Status: DC
  Filled 2023-03-04: qty 165, 37d supply, fill #0
  Filled 2023-04-10: qty 165, 37d supply, fill #1
  Filled 2023-05-15: qty 165, 37d supply, fill #2
  Filled 2023-06-15: qty 165, 37d supply, fill #3
  Filled 2023-07-25: qty 165, 37d supply, fill #4
  Filled 2023-08-31: qty 165, 37d supply, fill #5

## 2023-03-04 NOTE — Patient Instructions (Signed)
Increase carbidopa-levodopa to 1.5 tablets three times daily (9-10 AM, 12 PM and 5-6 PM) Continue exercises Follow up 6 months.

## 2023-03-06 ENCOUNTER — Other Ambulatory Visit (HOSPITAL_COMMUNITY): Payer: Self-pay | Admitting: Psychiatry

## 2023-03-07 ENCOUNTER — Encounter: Payer: Self-pay | Admitting: Family Medicine

## 2023-03-08 ENCOUNTER — Ambulatory Visit: Payer: Self-pay | Admitting: Licensed Clinical Social Worker

## 2023-03-08 ENCOUNTER — Other Ambulatory Visit (HOSPITAL_COMMUNITY): Payer: Self-pay | Admitting: Psychiatry

## 2023-03-08 ENCOUNTER — Other Ambulatory Visit (HOSPITAL_COMMUNITY): Payer: Self-pay

## 2023-03-08 ENCOUNTER — Other Ambulatory Visit (HOSPITAL_BASED_OUTPATIENT_CLINIC_OR_DEPARTMENT_OTHER): Payer: Self-pay

## 2023-03-08 ENCOUNTER — Ambulatory Visit: Payer: PPO | Admitting: Physical Therapy

## 2023-03-08 ENCOUNTER — Encounter: Payer: Self-pay | Admitting: Physical Therapy

## 2023-03-08 DIAGNOSIS — R2689 Other abnormalities of gait and mobility: Secondary | ICD-10-CM

## 2023-03-08 DIAGNOSIS — M6281 Muscle weakness (generalized): Secondary | ICD-10-CM

## 2023-03-08 DIAGNOSIS — Z9181 History of falling: Secondary | ICD-10-CM

## 2023-03-08 DIAGNOSIS — R262 Difficulty in walking, not elsewhere classified: Secondary | ICD-10-CM

## 2023-03-08 DIAGNOSIS — I69354 Hemiplegia and hemiparesis following cerebral infarction affecting left non-dominant side: Secondary | ICD-10-CM

## 2023-03-08 MED ORDER — CARBAMAZEPINE 100 MG/5ML PO SUSP
400.0000 mg | Freq: Every day | ORAL | 0 refills | Status: DC
  Filled 2023-03-08: qty 300, 15d supply, fill #0
  Filled 2023-03-08: qty 540, 27d supply, fill #0
  Filled 2023-03-09: qty 600, 30d supply, fill #0

## 2023-03-08 NOTE — Patient Instructions (Signed)
Visit Information  Thank you for taking time to visit with me today. Please don't hesitate to contact me if I can be of assistance to you.   Following are the goals we discussed today:   Goals Addressed             This Visit's Progress    Patient needs help with ADLs and with daily funtioning       Interventions:  Spoke with [name], spouse of client, via phone about status of client [name] reported that client has been going to physical therapy sessions as scheduled  [name] helps client with ADLs. Discussed previously client participation in Day Program at [facility].  He goes to that program 3 days per week [name] feels that day program at [facility] is very helpful to [name] asked if LCSW could call her back at a later time to discuss SW needs of client. LCSW agreed to this plan LCSW thanked [name] for phone call with LCSW Encouraged [name] to call LCSW as needed to further discuss SW support for client at phone number [phone].           Our next appointment is by telephone on 04/26/23 at 10:30 AM   Please call the care guide team at [phone] if you need to cancel or reschedule your appointment.   If you are experiencing a Mental Health or Behavioral Health Crisis or need someone to talk to, please go to [facility] [address], [place] [phone])   The patient/ [name], spouse,  verbalized understanding of instructions, educational materials, and care plan provided today and DECLINED offer to receive copy of patient instructions, educational materials, and care plan.   The patient / [name], spouse, has been provided with contact information for the care management team and has been advised to call with any health related questions or concerns.   Kelton Pillar.Sanyia Dini MSW, LCSW Licensed Visual merchandiser [facility] Care Management [phone]

## 2023-03-08 NOTE — Patient Outreach (Signed)
Care Coordination   Follow Up Visit Note   03/08/2023 Name: William [name] MRN: [id number] DOB: 12-20-1940  William [name] is a 82 y.o. year old male who sees [name], [name], DO for primary care. I spoke with  William [name]  / William [name], spouse of client, via phone today.  What matters to the patients health and wellness today?   Patient needs help with ADLs and with   daily functioning    Goals Addressed             This Visit's Progress    Patient needs help with ADLs and with daily funtioning       Interventions:  Spoke with William [name], spouse of client, via phone about status of client William [name] reported that client has been going to physical therapy sessions as scheduled  William [name] helps client with ADLs. Discussed previously client participation in [facility] at [facility].  He goes to that program 3 days per week William [name] feels that day program at [facility] is very helpful to William [name] asked if LCSW could call her back at a later time to discuss SW needs of client. LCSW agreed to this plan LCSW thanked William [name] for phone call with LCSW Encouraged William [name] to call LCSW as needed to further discuss SW support for client at phone number [phone].           SDOH assessments and interventions completed:  Yes  SDOH Interventions Today    Flowsheet Row Most Recent Value  SDOH Interventions   Physical Activity Interventions Other (Comments)  [client has some mobility challenges]  Stress Interventions Other (Comment)  [client has stress in completing ADLs. client has stress in managing medical needs]        Care Coordination Interventions:  Yes, provided  Interventions Today    Flowsheet Row Most Recent Value  Chronic Disease   Chronic disease during today's visit Other  [spoke with William [name], spouse, about client needs]  General Interventions   General Interventions Discussed/Reviewed General Interventions Discussed, Community Resources   Exercise Interventions   Exercise Discussed/Reviewed Physical Activity  [client has been participating in physical therapy sessions as scheduled]  Physical Activity Discussed/Reviewed Physical Activity Discussed  Education Interventions   Education Provided Provided Education  Provided Verbal Education On Community Resources  Mental Health Interventions   Mental Health Discussed/Reviewed Coping Strategies  [no mood issues noted]  Nutrition Interventions   Nutrition Discussed/Reviewed Nutrition Discussed  Pharmacy Interventions   Pharmacy Dicussed/Reviewed Pharmacy Topics Discussed        Follow up plan: Follow up call scheduled for 04/26/23 at 10:30 AM    Encounter Outcome:  Patient Visit Completed   William [name] MSW, LCSW Licensed Visual merchandiser [facility] Care Management [phone]

## 2023-03-08 NOTE — Therapy (Signed)
OUTPATIENT PHYSICAL THERAPY NEURO TREATMENT NOTE  Patient Name: William [name] MRN: [id number] DOB:01-29-1941, 82 y.o., male Today's Date: 03/08/2023  PCP: Donato Schultz  DO REFERRING PROVIDER: same   END OF SESSION:  PT End of Session - 03/08/23 1335     Visit Number 53    Date for PT Re-Evaluation 04/22/23    PT Start Time 1330    PT Stop Time 1410    PT Time Calculation (min) 40 min    Equipment Utilized During Treatment Gait belt    Activity Tolerance Patient tolerated treatment well    Behavior During Therapy WFL for tasks assessed/performed               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   HTN (hypertension) 07/19/2015   Hyperlipidemia    Major neurocognitive disorder 01/09/2014   Mild, related to stroke history   Nocturia    Renal insufficiency 06/25/2013   S/P carotid endarterectomy    BILATERAL ICA--  PATENT PER DUPLEX  05-19-2012   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   Right calf   Urinary frequency    Vitamin D deficiency    Past Surgical History:  Procedure Laterality Date   APPENDECTOMY  AS CHILD   CARDIOVASCULAR STRESS TEST  03-27-2012  DR CRENSHAW   LOW RISK LEXISCAN STUDY-- PROBABLE NORMAL PERFUSION AND SOFT TISSUE ATTENUATION/  NO ISCHEMIA/ EF 51%   CAROTID ENDARTERECTOMY Bilateral LEFT  11-12-2008  DR GREG HAYES   RIGHT ICA  2006  ([facility])   CHOLECYSTECTOMY N/A 02/23/2022   Procedure: LAPAROSCOPIC CHOLECYSTECTOMY;  Surgeon: Quentin Ore, MD;  Location: [facility] OR;  Service: General;  Laterality: N/A;    CYSTOSCOPY W/ RETROGRADES Bilateral 06/22/2021   Procedure: CYSTOSCOPY WITH RETROGRADE PYELOGRAM;  Surgeon: Marcine Matar, MD;  Location: [facility];  Service: Urology;  Laterality: Bilateral;   CYSTOSCOPY WITH LITHOLAPAXY N/A 02/26/2013   Procedure: CYSTOSCOPY WITH LITHOLAPAXY;  Surgeon: Marcine Matar, MD;  Location: [facility];  Service: Urology;  Laterality: N/A;   ENDOSCOPIC RETROGRADE CHOLANGIOPANCREATOGRAPHY (ERCP) WITH PROPOFOL N/A 02/22/2022   Procedure: ENDOSCOPIC RETROGRADE CHOLANGIOPANCREATOGRAPHY (ERCP) WITH PROPOFOL;  Surgeon: Jeani Hawking, MD;  Location: [facility] ENDOSCOPY;  Service: Gastroenterology;  Laterality: N/A;   EYE SURGERY  Jan. 2016   cataract surgery both eyes   INGUINAL HERNIA REPAIR Right 11-08-2006   IR KYPHO EA ADDL LEVEL THORACIC OR LUMBAR  02/12/2021   IR RADIOLOGIST EVAL & MGMT  02/18/2021   MASS EXCISION N/A 03/03/2016   Procedure: EXCISION OF BACK  MASS;  Surgeon: Almond Lint, MD;  Location: [facility];  Service: General;  Laterality: N/A;   MOHS SURGERY Left 1/ 2016   Dr Margo Aye-- Basal cell   PROSTATE SURGERY     REMOVAL OF STONES  02/22/2022   Procedure: REMOVAL OF STONES;  Surgeon: Jeani Hawking, MD;  Location: [facility] ENDOSCOPY;  Service: Gastroenterology;;   Dennison Mascot  02/22/2022   Procedure: Dennison Mascot;  Surgeon: Elnoria Howard,  Luisa Hart, MD;  Location: [facility] ENDOSCOPY;  Service: Gastroenterology;;   TRANSURETHRAL RESECTION OF BLADDER TUMOR WITH MITOMYCIN-C N/A 06/22/2021   Procedure: TRANSURETHRAL RESECTION OF BLADDER TUMOR;  Surgeon: Marcine Matar, MD;  Location: [facility];  Service: Urology;  Laterality: N/A;   TRANSURETHRAL RESECTION OF PROSTATE N/A 02/26/2013   Procedure: TRANSURETHRAL RESECTION OF THE PROSTATE WITH GYRUS INSTRUMENTS;  Surgeon: Marcine Matar, MD;  Location: [facility];  Service: Urology;  Laterality: N/A;   TRANSURETHRAL RESECTION OF PROSTATE N/A 06/22/2021    Procedure: TRANSURETHRAL RESECTION OF THE PROSTATE (TURP);  Surgeon: Marcine Matar, MD;  Location: [facility];  Service: Urology;  Laterality: N/A;   Patient Active Problem List   Diagnosis Date Noted   Laceration of right upper extremity 02/24/2023   Dermatitis 02/24/2023   Lung nodules 02/24/2023   Dysuria 08/03/2022   Nonintractable headache 07/01/2022   Bilateral impacted cerumen 06/24/2022   Rash 06/15/2022   Postoperative ileus (HCC) 02/27/2022   Ileus, postoperative (HCC) 02/26/2022   Choledocholithiasis 02/19/2022   DNR (do not resuscitate) 02/19/2022   Left elbow pain 01/26/2022   Mid back pain on left side 01/26/2022   Rib pain 01/26/2022   Acute pain of left shoulder 11/12/2021   Leukocytes in urine 11/12/2021   Urinary frequency 11/12/2021   Thrush 10/08/2021   Hemiplegia, 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   Insomnia    Prediabetes    Acute 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 (HCC)    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   Transaminitis 07/27/2021   UTI (urinary tract infection) 07/27/2021   CVA (cerebral vascular accident) (HCC) 07/27/2021   Fall 07/27/2021   Hyperglycemia 07/27/2021   Cholelithiasis 07/27/2021   Hypoxia 07/27/2021   Nausea and vomiting 07/27/2021   Acute metabolic encephalopathy 07/27/2021   Normocytic anemia 07/27/2021   Chronic back pain 07/27/2021   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   Closed fracture of first lumbar vertebra with routine healing 02/03/2021   Closed fracture of multiple ribs 11/18/2020   Anxiety 01/29/2020   Leg pain, bilateral 01/29/2020   Ingrown toenail 07/13/2019   Lumbar spondylosis 05/02/2018   Pain in left knee 03/09/2018   Osteoarthritis of left hip 01/16/2018   Trochanteric bursitis of left hip 01/16/2018   Preventative health care 09/26/2017   HTN (hypertension) 07/19/2015   Hyperlipidemia  07/19/2015   Great toe pain 02/11/2014   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   Obesity (BMI 30-39.9) 06/25/2013   Renal insufficiency 06/25/2013   Weakness of left arm 06/25/2013   Sebaceous cyst 03/03/2011   Sprain of lumbar region 07/31/2010   Rib pain, left 08/29/2009   Carotid artery stenosis, asymptomatic, bilateral 05/02/2009   Eczema, atopic 05/31/2008   Vitamin D deficiency 03/01/2008   BPH (benign prostatic hyperplasia) 08/06/2007   Fasting hyperglycemia 12/21/2006   History of right MCA infarct 2006    ONSET DATE: 06/29/22 REFERRING DIAG:  Diagnosis  I63.9 (ICD-10-CM) - Cerebrovascular accident (CVA), unspecified mechanism (HCC)  W19.XXXD (ICD-10-CM) - Fall, subsequent encounter    THERAPY DIAG:  Muscle weakness (generalized)  Other abnormalities of gait and mobility  Hemiplegia and hemiparesis following cerebral infarction affecting left non-dominant side (HCC)  History of falling  Difficulty in walking, not elsewhere classified  Rationale for Evaluation and Treatment: Rehabilitation  SUBJECTIVE:  SUBJECTIVE STATEMENT:    Reports had a good weekend, good rest, no falls  Per spouse: yesterday was the first day for the bus for [facility], left early and didn't get back until 4:30pm so missed some medicines, he goes again tomorrow and we will keep that from happening again. MD is working with [place] for orders for [facility] to have him up and moving regularly, noticed he was more tired yesterday and today   PERTINENT HISTORY:  SABIN MOLTON is a 82 year old man with dementia, CKD, HTN, CAD, HLD and history of CVA  (2006, 2023), Parkinson's  PAIN:  Are you having pain? Faces/behavior scale- mild pain BLEs   PRECAUTIONS: Fall  WEIGHT BEARING RESTRICTIONS: No  FALLS:  Has patient fallen in last 6 months? No  LIVING ENVIRONMENT: Lives with: lives with their family and lives with their spouse Lives in: House/apartment Stairs:  1 brick high step Has following equipment at home: Environmental consultant - 2 wheeled, Wheelchair (manual), Shower bench, bed side commode, Grab bars, and hospital bed, sliding pad for car  PLOF: Independent with basic ADLs and Independent with household mobility without device  PATIENT GOALS: Walk around the block with his wife. Be able to use the dining room chair rather than have to use the W/C. Increased I with bed mobility and simple tasks at home like make some coffee or brush his teeth, Step over tub to shower, has bench, but he can't really use it. Up and down the one step to get to back deck. 11/23/22: Patient's wife [name] updated his goals today: Walk around store (gets distracted), swing a golf club again (thinking of driving range), get up on his own, walk without leaning forward, more recognition of the left side (from CVA), not need gait belt, OT-- be able to get dressed more on his own, navigate the newspaper.  OBJECTIVE:  TREATMENT: 03/08/23 NuStep L5 x 6 minutes Attempted side step over obstacle and then reach up with both hands in OHP, but he had difficulty following the activities Ambulated x 100' holding beach ball, pushing it into OHP, the reaching into rotation to each side a various intervals, min a to include LUE in reach Seated catching and tossing beach ball with weight shifts to each side, tending to lean back. Forward flexion over Blue physioball, with hold, 5 x forward and then to each side. In parallel bars rotation, stepping back with R LE and then reaching back to touch target on the wall, repeat to L. Standing shoulder ext against R tband resistance, small excursion, required max VC and TC to move through larger excursions More standing rotation and reach up to down to each side x 6 reps, CGA. B side stepping, emphasize  big steps and changing direction, stable throughout.  03/01/23 Nustep level 5 x 6 minutes UBE level 4 x 4 minutes Marches Hip abduction Step reach, turn [employer], [employer] Gait fast walking HHA 2x 220 feet Seated PWR moves   TODAY'S TREATMENT 02/22/23  Nustep L3 x6 minutes (decreased resistance due to [place] reporting some BLE pain this session) intermittent MinA for amplitude of movement  Seated PWR moves (PWR up, reach, rotation, step) with cognitive boosts  Standing PWR moves- lateral reaches  with visual boost with cones, multimodal cues for use of LUE    TODAY'S TREATMENT 02/16/23  Nustep L4x8 minutes cues for big motions all extremities  Walking outside over grass with cues for big steps and foot clearance/step length, slowing pace  MinAx2 for sequencing/initiating transfer off of circular picnic table (turning left and then standing instead of standing up with legs blocked by table) Reaching down to floor to tap cone for wt shift, then up to center to toss ball with focus on upright posture/trunk stretch Alternating seated marches with focus on foot clearance Max cues for foot clearance STS no UEs x4   PATIENT EDUCATION: Education details: none new 02/16/23 Person educated: Patient and Spouse Education method: Explanation, Demonstration, Tactile cues, and Verbal cues Education comprehension: verbalized understanding, returned demonstration, verbal cues required, tactile cues required, and needs further education   ------------------------------------------------------------------------------------------------------- (Measures in this section from initial evaluation unless otherwise noted) DIAGNOSTIC FINDINGS:  MRI 2021 with degenerative changes in lumbar spine, L3-4 R subarticular stenosis COGNITION: Overall cognitive status: History of cognitive impairments - at baseline SENSATION: Not tested COORDINATION: Moderately impaired BLE MUSCLE LENGTH: Hamstrings: severely  restricted B Thomas test: Severely restricted B. POSTURE: rounded shoulders, forward head, increased thoracic kyphosis, posterior pelvic tilt, and flexed trunk  LOWER EXTREMITY ROM:   BLE extremely stiff throughout, limited hip ROM in all planes, B knees limited in extension. LOWER EXTREMITY MMT:  3+/5 throughout. Unable to determine accuracy of MMT due to cognition. Functionally demosntrates poor coordinated activation and poor muscular endurance. BED MOBILITY: Min A for Supine<> Sit. Patient was using a ladder in his hospital bed and could move MI. TRANSFERS: Min A, mod VC, tends to lean back.  11/02/22 independent with use of hands on arm chair with a couple of tries CURB: Min A-Has one step out to his deck. STAIRS: N/A  GAIT: Gait pattern: step to pattern, decreased arm swing- Right, decreased arm swing- Left, decreased step length- Right, decreased step length- Left, shuffling, festinating, trunk flexed, narrow BOS, poor foot clearance- Right, and poor foot clearance- Left Distance walked: 15' Assistive device utilized: Environmental consultant - 2 wheeled Level of assistance: Occasional min A Comments: mod VC to stay close to walker, take longer steps. FUNCTIONAL TESTS:  5 times sit to stand: 38.23  09/29/22  could not performs 5XSTS, 11/02/22 = 47 seconds with cues to use arms Timed up and go (TUG): 47.91  with RW, 07/21/22 TUG 29  seconds without device, 09/29/22 25 seconds, 11/02/22 TUG 20 seconds                                                                     PATIENT EDUCATION: Education details: Progress towards goals, POC Person educated: Patient and Spouse Education method: Programmer, multimedia, Facilities manager, and Verbal cues  Education comprehension: verbalized understanding, returned demonstration, verbal cues required, and needs further education  HOME EXERCISE PROGRAM: PWR! moves  GOALS: Goals reviewed with patient? Yes  SHORT TERM GOALS: Target date: 12/22/22  Perform HEP for PWR! Moves with  cues from family. Baseline: Pt's wife, [name], notes walking and hitting beach ball as main HEP. Goal status: GOAL MET, 12/20/2022   2. Decrease 5 x STS to < 35 sec to demonstrate improved functional strength  Baseline: 47 seconds on 5/21 per note; 34.5 sec UE support 12/20/2022  Goal status: GOAL MET, 12/20/2022   3. Perform sit <> stand transfers from average chair height with Supervision.  Baseline: CGA  12/20/2022; (01/10/23) CGA  Goal status: NOT MET  LONG TERM GOALS: Target date: 01/22/23  Patient will perform HEP progression with caregiver assist. Baseline: has minimal HEP Goal status: ONGOING  2.  Decrease 5 x STS to < 25 sec to demonstrate improved functional strength Baseline: 47 seconds on 5/21 per note; (01/10/23) 36 sec w/ cues for speed/sequence>30.38 sec with BUE supprot Goal status: PARTIALLY MET, 01/24/2023  3. Improve quality of gait to be able to ambulate x 100 feet with supervision. Baseline: CGA with cues  Goal status: REVISED  4.  Patient will perform his bed mobility with MI including rolling, sit<>supine, and scooting in bed. Baseline: on hard surface mat he is independent but on soft bed at home he struggles, wife reports only needs a hand to get up now 11/02/22; (01/10/23) modified indep.  Goal status: MET  5.  Patient will demonstrate floor<>stand with CGA and verbal cues for patient's wife to be able to assist. Baseline:  Has to call 9-1-1; +2 max assist floor>stand with UE support 01/19/2023 Goal status: GOAL NOT MET 01/19/2023 **UPDATED GOALS BELOW ** per recert 01/24/2023  SHORT TERM GOALS: Target date: 02/25/2023   Pt will perform HEP to maintain lower body strength, flexibility, posture with wife's assistance. Baseline:  Goal status: 03/07/23- ongoing   2. Pt will perform sit to stand, 8 of 10 reps with BUE support, with supervision, to maintain safety with transfers.  Baseline: min guard  Goal status: 02/07/23-CGA from elevated surface and max VC to lean forward,  ongoing   LONG TERM GOALS: Target date: 04/22/2023   Patient will perform HEP progression to maintain lower body strength, flexibility, posture with wife's assistance. Baseline:  Goal status: INITIAL  2.  Pt will ambulate 150-175 ft in 2 MWT with min guard assist, to preserve functional mobility within the home.  Baseline: Goal status:  INITIAL  3. Wife will verbalize tips for fall prevention education to minimize fall frequency Baseline:  Goal status: INITIAL   ASSESSMENT:  CLINICAL IMPRESSION:  Patient's wife reports that he seems tired today. He did have increased difficulty following some commands, treatment activities modified to have fewer steps and require fewer cues. He was able to participate fully, although distractible. He performed ist to stand a little more consistently with forward lean and push up, requiring fewer cues today.  OBJECTIVE IMPAIRMENTS: Abnormal gait, decreased activity tolerance, decreased balance, decreased cognition, decreased coordination, decreased endurance, decreased mobility, difficulty walking, decreased ROM, decreased strength, decreased safety awareness, impaired flexibility, impaired UE functional use, improper body mechanics, and postural dysfunction.   PLAN:  PT FREQUENCY: 1x/week  PT DURATION: 12 weeks  PLANNED INTERVENTIONS: Therapeutic exercises, Therapeutic activity, Neuromuscular re-education, Balance training, Gait training, Patient/Family education, Self Care, Joint mobilization, Stair training, Moist heat, and Manual therapy  PLAN FOR NEXT SESSION:   Continue exercises for lower body strength, sit<>Stand and walking for exercise.  Check about [employer] as part of his routine.   Oley Balm DPT 03/08/23 2:15 PM  03/08/23 2:15 PM  [facility] at [facility] [address] [place], [place], [address] Phone: [phone]   Fax:  [phone]

## 2023-03-09 ENCOUNTER — Telehealth: Payer: Self-pay | Admitting: Neurology

## 2023-03-09 ENCOUNTER — Other Ambulatory Visit (HOSPITAL_BASED_OUTPATIENT_CLINIC_OR_DEPARTMENT_OTHER): Payer: Self-pay

## 2023-03-09 NOTE — Telephone Encounter (Signed)
Pt's wife called in stating a paper was faxed in to change the dosage of the pt's carbidopa-levodopa at his daycare, but they have not received it yet. She is holding his medication until they receive the updated information. She would like a call back

## 2023-03-09 NOTE — Telephone Encounter (Signed)
Form signed by [facility] with note Dr.Jaffe is out of the office and will return on 03/14/23.

## 2023-03-10 ENCOUNTER — Ambulatory Visit: Payer: PPO | Admitting: Podiatry

## 2023-03-10 ENCOUNTER — Other Ambulatory Visit (HOSPITAL_BASED_OUTPATIENT_CLINIC_OR_DEPARTMENT_OTHER): Payer: Self-pay

## 2023-03-10 ENCOUNTER — Encounter: Payer: Self-pay | Admitting: Podiatry

## 2023-03-10 DIAGNOSIS — Z7901 Long term (current) use of anticoagulants: Secondary | ICD-10-CM | POA: Diagnosis not present

## 2023-03-10 DIAGNOSIS — B351 Tinea unguium: Secondary | ICD-10-CM | POA: Diagnosis not present

## 2023-03-10 DIAGNOSIS — M79676 Pain in unspecified toe(s): Secondary | ICD-10-CM | POA: Diagnosis not present

## 2023-03-10 NOTE — Telephone Encounter (Signed)
Refax form from another fax machine to see if it will go through

## 2023-03-10 NOTE — Telephone Encounter (Signed)
[facility] called in stating they did not get the form that was signed by Dr. Karel Jarvis due to Dr. Everlena Cooper being out. Their fax number is [phone] ATTN: [place]

## 2023-03-10 NOTE — Progress Notes (Signed)
Subjective: Chief Complaint  Patient presents with   Foot Pain    Patient is here for RFC/nail trim no issues    82 y.o. returns the office today for painful, elongated, thickened toenails which he cannot trim himself.  His toenails are sensitive.  Denies any associated drainage or redness from these sites.  No open lesions.  No other concerns today.  He is on Plavix.  PCP: Donato Schultz, DO Last seen February 24, 2023  Objective: NAD, presents with his wife DP/PT pulses palpable, CRT less than 3 seconds  Nails hypertrophic, dystrophic, elongated, brittle, discolored x 10.  Incurvation of the left hallux toenail without any edema, erythema or signs of infection.  There is tenderness overlying the nails 1-5 bilaterally.  No swelling around the nails, redness or drainage or any signs of infection. Hammertoes present No open lesions No pain with calf compression, swelling, warmth, erythema.  Assessment: Patient presents with symptomatic onychomycosis  Plan: -Treatment options including alternatives, risks, complications were discussed -Nails debrided x 10 without any complications or bleeding.  Ingrown toenails been doing well at this time.  Monitor for any signs or symptoms of infection or pain associated with this.  -Advised continued foot checks  Return in about 3 months (around 06/09/2023) for routine foot care, nails. Follow up with Dr. Ardelle Anton.  Bronwen Betters, DPM

## 2023-03-13 ENCOUNTER — Other Ambulatory Visit: Payer: Self-pay | Admitting: Family Medicine

## 2023-03-14 ENCOUNTER — Other Ambulatory Visit (HOSPITAL_BASED_OUTPATIENT_CLINIC_OR_DEPARTMENT_OTHER): Payer: Self-pay

## 2023-03-14 ENCOUNTER — Encounter: Payer: Self-pay | Admitting: Family Medicine

## 2023-03-14 MED ORDER — CLOPIDOGREL BISULFATE 75 MG PO TABS
75.0000 mg | ORAL_TABLET | Freq: Every day | ORAL | 1 refills | Status: DC
  Filled 2023-03-14: qty 90, 90d supply, fill #0

## 2023-03-15 ENCOUNTER — Encounter: Payer: Self-pay | Admitting: Physical Therapy

## 2023-03-15 ENCOUNTER — Ambulatory Visit: Payer: PPO | Attending: Family Medicine | Admitting: Physical Therapy

## 2023-03-15 DIAGNOSIS — R278 Other lack of coordination: Secondary | ICD-10-CM | POA: Diagnosis not present

## 2023-03-15 DIAGNOSIS — M6281 Muscle weakness (generalized): Secondary | ICD-10-CM | POA: Insufficient documentation

## 2023-03-15 DIAGNOSIS — R41840 Attention and concentration deficit: Secondary | ICD-10-CM | POA: Diagnosis not present

## 2023-03-15 DIAGNOSIS — I69354 Hemiplegia and hemiparesis following cerebral infarction affecting left non-dominant side: Secondary | ICD-10-CM | POA: Diagnosis not present

## 2023-03-15 DIAGNOSIS — R2689 Other abnormalities of gait and mobility: Secondary | ICD-10-CM | POA: Insufficient documentation

## 2023-03-15 DIAGNOSIS — R262 Difficulty in walking, not elsewhere classified: Secondary | ICD-10-CM | POA: Insufficient documentation

## 2023-03-15 DIAGNOSIS — R296 Repeated falls: Secondary | ICD-10-CM | POA: Insufficient documentation

## 2023-03-15 DIAGNOSIS — Z9181 History of falling: Secondary | ICD-10-CM | POA: Diagnosis not present

## 2023-03-15 NOTE — Therapy (Signed)
OUTPATIENT PHYSICAL THERAPY NEURO TREATMENT NOTE  Patient Name: William [name] MRN: [id number] DOB:[birth year], 82 y.o., male Today's Date: 03/15/2023  PCP: Donato Schultz  DO REFERRING PROVIDER: same   END OF SESSION:  PT End of Session - 03/15/23 1445     Visit Number 54    Number of Visits 66    Date for PT Re-Evaluation 04/22/23    Authorization Type HTA    PT Start Time 1443    PT Stop Time 1528    PT Time Calculation (min) 45 min    Equipment Utilized During Treatment Gait belt    Activity Tolerance Patient tolerated treatment well    Behavior During Therapy WFL for tasks assessed/performed               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   HTN (hypertension) 07/19/2015   Hyperlipidemia    Major neurocognitive disorder 01/09/2014   Mild, related to stroke history   Nocturia    Renal insufficiency 06/25/2013   S/P carotid endarterectomy    BILATERAL ICA--  PATENT PER DUPLEX  05-19-2012   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   Right calf   Urinary frequency    Vitamin D deficiency    Past Surgical History:  Procedure Laterality Date   APPENDECTOMY  AS CHILD   CARDIOVASCULAR STRESS TEST  03-27-2012  DR CRENSHAW   LOW RISK LEXISCAN STUDY-- PROBABLE NORMAL PERFUSION AND SOFT TISSUE ATTENUATION/  NO ISCHEMIA/ EF 51%   CAROTID ENDARTERECTOMY Bilateral LEFT  11-12-2008  DR GREG HAYES   RIGHT ICA  2006  ([facility])   CHOLECYSTECTOMY N/A 02/23/2022   Procedure: LAPAROSCOPIC CHOLECYSTECTOMY;  Surgeon: Quentin Ore, MD;   Location: [facility] OR;  Service: General;  Laterality: N/A;   CYSTOSCOPY W/ RETROGRADES Bilateral 06/22/2021   Procedure: CYSTOSCOPY WITH RETROGRADE PYELOGRAM;  Surgeon: Marcine Matar, MD;  Location: [facility];  Service: Urology;  Laterality: Bilateral;   CYSTOSCOPY WITH LITHOLAPAXY N/A 02/26/2013   Procedure: CYSTOSCOPY WITH LITHOLAPAXY;  Surgeon: Marcine Matar, MD;  Location: [facility];  Service: Urology;  Laterality: N/A;   ENDOSCOPIC RETROGRADE CHOLANGIOPANCREATOGRAPHY (ERCP) WITH PROPOFOL N/A 02/22/2022   Procedure: ENDOSCOPIC RETROGRADE CHOLANGIOPANCREATOGRAPHY (ERCP) WITH PROPOFOL;  Surgeon: Jeani Hawking, MD;  Location: [facility] ENDOSCOPY;  Service: Gastroenterology;  Laterality: N/A;   EYE SURGERY  Jan. 2016   cataract surgery both eyes   INGUINAL HERNIA REPAIR Right 11-08-2006   IR KYPHO EA ADDL LEVEL THORACIC OR LUMBAR  02/12/2021   IR RADIOLOGIST EVAL & MGMT  02/18/2021   MASS EXCISION N/A 03/03/2016   Procedure: EXCISION OF BACK  MASS;  Surgeon: Almond Lint, MD;  Location: [facility];  Service: General;  Laterality: N/A;   MOHS SURGERY Left 1/ 2016   Dr Margo Aye-- Basal cell   PROSTATE SURGERY     REMOVAL OF STONES  02/22/2022   Procedure: REMOVAL OF STONES;  Surgeon: Jeani Hawking, MD;  Location: [facility] ENDOSCOPY;  Service:  Gastroenterology;;   Dennison Mascot  02/22/2022   Procedure: Dennison Mascot;  Surgeon: Jeani Hawking, MD;  Location: [facility] ENDOSCOPY;  Service: Gastroenterology;;   TRANSURETHRAL RESECTION OF BLADDER TUMOR WITH MITOMYCIN-C N/A 06/22/2021   Procedure: TRANSURETHRAL RESECTION OF BLADDER TUMOR;  Surgeon: Marcine Matar, MD;  Location: [facility];  Service: Urology;  Laterality: N/A;   TRANSURETHRAL RESECTION OF PROSTATE N/A 02/26/2013   Procedure: TRANSURETHRAL RESECTION OF THE PROSTATE WITH GYRUS INSTRUMENTS;  Surgeon: Marcine Matar, MD;  Location: [facility];  Service: Urology;  Laterality: N/A;    TRANSURETHRAL RESECTION OF PROSTATE N/A 06/22/2021   Procedure: TRANSURETHRAL RESECTION OF THE PROSTATE (TURP);  Surgeon: Marcine Matar, MD;  Location: [facility];  Service: Urology;  Laterality: N/A;   Patient Active Problem List   Diagnosis Date Noted   Laceration of right upper extremity 02/24/2023   Dermatitis 02/24/2023   Lung nodules 02/24/2023   Dysuria 08/03/2022   Nonintractable headache 07/01/2022   Bilateral impacted cerumen 06/24/2022   Rash 06/15/2022   Postoperative ileus (HCC) 02/27/2022   Ileus, postoperative (HCC) 02/26/2022   Choledocholithiasis 02/19/2022   DNR (do not resuscitate) 02/19/2022   Left elbow pain 01/26/2022   Mid back pain on left side 01/26/2022   Rib pain 01/26/2022   Acute pain of left shoulder 11/12/2021   Leukocytes in urine 11/12/2021   Urinary frequency 11/12/2021   Thrush 10/08/2021   Hemiplegia, 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   Insomnia    Prediabetes    Acute 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 (HCC)    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   Transaminitis 07/27/2021   UTI (urinary tract infection) 07/27/2021   CVA (cerebral vascular accident) (HCC) 07/27/2021   Fall 07/27/2021   Hyperglycemia 07/27/2021   Cholelithiasis 07/27/2021   Hypoxia 07/27/2021   Nausea and vomiting 07/27/2021   Acute metabolic encephalopathy 07/27/2021   Normocytic anemia 07/27/2021   Chronic back pain 07/27/2021   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   Closed fracture of first lumbar vertebra with routine healing 02/03/2021   Closed fracture of multiple ribs 11/18/2020   Anxiety 01/29/2020   Leg pain, bilateral 01/29/2020   Ingrown toenail 07/13/2019   Lumbar spondylosis 05/02/2018   Pain in left knee 03/09/2018   Osteoarthritis of left hip 01/16/2018   Trochanteric bursitis of left hip 01/16/2018   Preventative health care 09/26/2017    HTN (hypertension) 07/19/2015   Hyperlipidemia 07/19/2015   Great toe pain 02/11/2014   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   Obesity (BMI 30-39.9) 06/25/2013   Renal insufficiency 06/25/2013   Weakness of left arm 06/25/2013   Sebaceous cyst 03/03/2011   Sprain of lumbar region 07/31/2010   Rib pain, left 08/29/2009   Carotid artery stenosis, asymptomatic, bilateral 05/02/2009   Eczema, atopic 05/31/2008   Vitamin D deficiency 03/01/2008   BPH (benign prostatic hyperplasia) 08/06/2007   Fasting hyperglycemia 12/21/2006   History of right MCA infarct 2006    ONSET DATE: 06/29/22 REFERRING DIAG:  Diagnosis  I63.9 (ICD-10-CM) - Cerebrovascular accident (CVA), unspecified mechanism (HCC)  W19.XXXD (ICD-10-CM) - Fall, subsequent encounter    THERAPY DIAG:  Muscle weakness (generalized)  Other abnormalities of gait and mobility  Hemiplegia and hemiparesis following cerebral infarction affecting left non-dominant side (HCC)  History of falling  Difficulty in walking, not elsewhere classified  Other lack of coordination  Rationale for Evaluation and Treatment: Rehabilitation  SUBJECTIVE:  SUBJECTIVE STATEMENT:    Doing okay, no falls, try to walk to the mail box most days   Per spouse: yesterday was the first day for the bus for [facility], left early and didn't get back until 4:30pm so missed some medicines, he goes again tomorrow and we will keep that from happening again. MD is working with [place] for orders for [facility] to have him up and moving regularly, noticed he was more tired yesterday and today   PERTINENT HISTORY:  LINELL RUDDOCK is a 82 year old man with dementia, CKD, HTN, CAD, HLD and history of CVA  (2006, 2023), Parkinson's  PAIN:  Are you having pain?  Faces/behavior scale- mild pain BLEs   PRECAUTIONS: Fall  WEIGHT BEARING RESTRICTIONS: No  FALLS: Has patient fallen in last 6 months? No  LIVING ENVIRONMENT: Lives with: lives with their family and lives with their spouse Lives in: House/apartment Stairs:  1 brick high step Has following equipment at home: Environmental consultant - 2 wheeled, Wheelchair (manual), Shower bench, bed side commode, Grab bars, and hospital bed, sliding pad for car  PLOF: Independent with basic ADLs and Independent with household mobility without device  PATIENT GOALS: Walk around the block with his wife. Be able to use the dining room chair rather than have to use the W/C. Increased I with bed mobility and simple tasks at home like make some coffee or brush his teeth, Step over tub to shower, has bench, but he can't really use it. Up and down the one step to get to back deck. 11/23/22: Patient's wife [name] updated his goals today: Walk around store (gets distracted), swing a golf club again (thinking of driving range), get up on his own, walk without leaning forward, more recognition of the left side (from CVA), not need gait belt, OT-- be able to get dressed more on his own, navigate the newspaper.  OBJECTIVE:  TREATMENT: 03/15/23 Nustep level 5 x 7 minutes Gait outside with CGA and HHA multiple rests and redirection for big step Stairs step over step up and down with cues and CGA Seated volleyball hands and then kicks Side stepping Marches Hip abduction Fast walking with [facility]  03/08/23 NuStep L5 x 6 minutes Attempted side step over obstacle and then reach up with both hands in OHP, but he had difficulty following the activities Ambulated x 100' holding beach ball, pushing it into OHP, the reaching into rotation to each side a various intervals, min a to include LUE in reach Seated catching and tossing beach ball with weight shifts to each side, tending to lean back. Forward flexion over Blue physioball, with hold, 5 x  forward and then to each side. In parallel bars rotation, stepping back with R LE and then reaching back to touch target on the wall, repeat to L. Standing shoulder ext against R tband resistance, small excursion, required max VC and TC to move through larger excursions More standing rotation and reach up to down to each side x 6 reps, CGA. B side stepping, emphasize big steps and changing direction, stable throughout.  03/01/23 Nustep level 5 x 6 minutes UBE level 4 x 4 minutes Marches Hip abduction Step reach, turn [employer], [employer] Gait fast walking HHA 2x 220 feet Seated PWR moves   TODAY'S TREATMENT 02/22/23  Nustep L3 x6 minutes (decreased resistance due to [place] reporting some BLE pain this session) intermittent MinA for amplitude of movement  Seated PWR moves (PWR up, reach, rotation, step) with cognitive  boosts  Standing PWR moves- lateral reaches  with visual boost with cones, multimodal cues for use of LUE    TODAY'S TREATMENT 02/16/23  Nustep L4x8 minutes cues for big motions all extremities  Walking outside over grass with cues for big steps and foot clearance/step length, slowing pace  MinAx2 for sequencing/initiating transfer off of circular picnic table (turning left and then standing instead of standing up with legs blocked by table) Reaching down to floor to tap cone for wt shift, then up to center to toss ball with focus on upright posture/trunk stretch Alternating seated marches with focus on foot clearance Max cues for foot clearance STS no UEs x4   PATIENT EDUCATION: Education details: none new 02/16/23 Person educated: Patient and Spouse Education method: Explanation, Demonstration, Tactile cues, and Verbal cues Education comprehension: verbalized understanding, returned demonstration, verbal cues required, tactile cues required, and needs further education    ------------------------------------------------------------------------------------------------------- (Measures in this section from initial evaluation unless otherwise noted) DIAGNOSTIC FINDINGS:  MRI 2021 with degenerative changes in lumbar spine, L3-4 R subarticular stenosis COGNITION: Overall cognitive status: History of cognitive impairments - at baseline SENSATION: Not tested COORDINATION: Moderately impaired BLE MUSCLE LENGTH: Hamstrings: severely restricted B Thomas test: Severely restricted B. POSTURE: rounded shoulders, forward head, increased thoracic kyphosis, posterior pelvic tilt, and flexed trunk  LOWER EXTREMITY ROM:   BLE extremely stiff throughout, limited hip ROM in all planes, B knees limited in extension. LOWER EXTREMITY MMT:  3+/5 throughout. Unable to determine accuracy of MMT due to cognition. Functionally demosntrates poor coordinated activation and poor muscular endurance. BED MOBILITY: Min A for Supine<> Sit. Patient was using a ladder in his hospital bed and could move MI. TRANSFERS: Min A, mod VC, tends to lean back.  11/02/22 independent with use of hands on arm chair with a couple of tries CURB: Min A-Has one step out to his deck. STAIRS: N/A  GAIT: Gait pattern: step to pattern, decreased arm swing- Right, decreased arm swing- Left, decreased step length- Right, decreased step length- Left, shuffling, festinating, trunk flexed, narrow BOS, poor foot clearance- Right, and poor foot clearance- Left Distance walked: 66' Assistive device utilized: Environmental consultant - 2 wheeled Level of assistance: Occasional min A Comments: mod VC to stay close to walker, take longer steps. FUNCTIONAL TESTS:  5 times sit to stand: 38.23  09/29/22  could not performs 5XSTS, 11/02/22 = 47 seconds with cues to use arms Timed up and go (TUG): 47.91  with RW, 07/21/22 TUG 29  seconds without device, 09/29/22 25 seconds, 11/02/22 TUG 20 seconds                                                                      PATIENT EDUCATION: Education details: Progress towards goals, POC Person educated: Patient and Spouse Education method: Programmer, multimedia, Facilities manager, and Verbal cues  Education comprehension: verbalized understanding, returned demonstration, verbal cues required, and needs further education  HOME EXERCISE PROGRAM: PWR! moves  GOALS: Goals reviewed with patient? Yes  SHORT TERM GOALS: Target date: 12/22/22  Perform HEP for PWR! Moves with cues from family. Baseline: Pt's wife, [name], notes walking and hitting beach ball as main HEP. Goal status: GOAL MET, 12/20/2022   2. Decrease 5 x STS to < 35  sec to demonstrate improved functional strength  Baseline: 47 seconds on 5/21 per note; 34.5 sec UE support 12/20/2022  Goal status: GOAL MET, 12/20/2022   3. Perform sit <> stand transfers from average chair height with Supervision.  Baseline: CGA  12/20/2022; (01/10/23) CGA  Goal status: NOT MET  LONG TERM GOALS: Target date: 01/22/23  Patient will perform HEP progression with caregiver assist. Baseline: has minimal HEP Goal status: ONGOING  2.  Decrease 5 x STS to < 25 sec to demonstrate improved functional strength Baseline: 47 seconds on 5/21 per note; (01/10/23) 36 sec w/ cues for speed/sequence>30.38 sec with BUE supprot Goal status: PARTIALLY MET, 01/24/2023  3. Improve quality of gait to be able to ambulate x 100 feet with supervision. Baseline: CGA with cues  Goal status: REVISED  4.  Patient will perform his bed mobility with MI including rolling, sit<>supine, and scooting in bed. Baseline: on hard surface mat he is independent but on soft bed at home he struggles, wife reports only needs a hand to get up now 11/02/22; (01/10/23) modified indep.  Goal status: MET  5.  Patient will demonstrate floor<>stand with CGA and verbal cues for patient's wife to be able to assist. Baseline:  Has to call 9-1-1; +2 max assist floor>stand with UE support 01/19/2023 Goal status: GOAL NOT  MET 01/19/2023 **UPDATED GOALS BELOW ** per recert 01/24/2023  SHORT TERM GOALS: Target date: 02/25/2023   Pt will perform HEP to maintain lower body strength, flexibility, posture with wife's assistance. Baseline:  Goal status: 03/07/23- ongoing   2. Pt will perform sit to stand, 8 of 10 reps with BUE support, with supervision, to maintain safety with transfers.  Baseline: min guard  Goal status: 02/07/23-CGA from elevated surface and max VC to lean forward, ongoing   LONG TERM GOALS: Target date: 04/22/2023   Patient will perform HEP progression to maintain lower body strength, flexibility, posture with wife's assistance. Baseline:  Goal status: INITIAL  2.  Pt will ambulate 150-175 ft in 2 MWT with min guard assist, to preserve functional mobility within the home.  Baseline: Goal status:  INITIAL  3. Wife will verbalize tips for fall prevention education to minimize fall frequency Baseline:  Goal status: INITIAL   ASSESSMENT:  CLINICAL IMPRESSION:  Patient's wife reports that she had a difficult time with him today.  We did much more walking today outside, with rest breaks and many times had to stop and redirect for bigger steps, he was able to do some step and reach without a lot of difficulty which is a higher level activity  OBJECTIVE IMPAIRMENTS: Abnormal gait, decreased activity tolerance, decreased balance, decreased cognition, decreased coordination, decreased endurance, decreased mobility, difficulty walking, decreased ROM, decreased strength, decreased safety awareness, impaired flexibility, impaired UE functional use, improper body mechanics, and postural dysfunction.   PLAN:  PT FREQUENCY: 1x/week  PT DURATION: 12 weeks  PLANNED INTERVENTIONS: Therapeutic exercises, Therapeutic activity, Neuromuscular re-education, Balance training, Gait training, Patient/Family education, Self Care, Joint mobilization, Stair training, Moist heat, and Manual therapy  PLAN FOR NEXT  SESSION:   Continue exercises for lower body strength, sit<>Stand and walking for exercise.  Check about [employer] as part of his routine.   Stacie Glaze, PT 03/15/23 2:49 PM  03/15/23 2:49 PM  [facility] [address] [place], [place], [address] Phone: [phone]   Fax:  [phone]

## 2023-03-16 ENCOUNTER — Other Ambulatory Visit (HOSPITAL_BASED_OUTPATIENT_CLINIC_OR_DEPARTMENT_OTHER): Payer: Self-pay

## 2023-03-20 ENCOUNTER — Other Ambulatory Visit: Payer: Self-pay | Admitting: Family Medicine

## 2023-03-21 ENCOUNTER — Other Ambulatory Visit (HOSPITAL_COMMUNITY): Payer: Self-pay | Admitting: Psychiatry

## 2023-03-21 ENCOUNTER — Other Ambulatory Visit (HOSPITAL_BASED_OUTPATIENT_CLINIC_OR_DEPARTMENT_OTHER): Payer: Self-pay

## 2023-03-21 MED ORDER — AMANTADINE HCL 100 MG PO CAPS
100.0000 mg | ORAL_CAPSULE | Freq: Every day | ORAL | 1 refills | Status: DC
  Filled 2023-03-21: qty 90, 90d supply, fill #0
  Filled 2023-06-15: qty 90, 90d supply, fill #1

## 2023-03-22 ENCOUNTER — Other Ambulatory Visit (HOSPITAL_BASED_OUTPATIENT_CLINIC_OR_DEPARTMENT_OTHER): Payer: Self-pay

## 2023-03-22 MED ORDER — LORAZEPAM 0.5 MG PO TABS
ORAL_TABLET | ORAL | 4 refills | Status: DC
Start: 2023-03-22 — End: 2023-04-04
  Filled 2023-03-22: qty 105, 30d supply, fill #0

## 2023-03-23 NOTE — Therapy (Signed)
OUTPATIENT PHYSICAL THERAPY NEURO TREATMENT NOTE  Patient Name: William [name] MRN: [id number] DOB:1940-12-10, 82 y.o., male Today's Date: 03/23/2023  PCP: Donato Schultz  DO REFERRING PROVIDER: same   END OF SESSION:      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   HTN (hypertension) 07/19/2015   Hyperlipidemia    Major neurocognitive disorder 01/09/2014   Mild, related to stroke history   Nocturia    Renal insufficiency 06/25/2013   S/P carotid endarterectomy    BILATERAL ICA--  PATENT PER DUPLEX  05-19-2012   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   Right calf   Urinary frequency    Vitamin D deficiency    Past Surgical History:  Procedure Laterality Date   APPENDECTOMY  AS CHILD   CARDIOVASCULAR STRESS TEST  03-27-2012  DR CRENSHAW   LOW RISK LEXISCAN STUDY-- PROBABLE NORMAL PERFUSION AND SOFT TISSUE ATTENUATION/  NO ISCHEMIA/ EF 51%   CAROTID ENDARTERECTOMY Bilateral LEFT  11-12-2008  DR GREG HAYES   RIGHT ICA  2006  ([facility])   CHOLECYSTECTOMY N/A 02/23/2022   Procedure: LAPAROSCOPIC CHOLECYSTECTOMY;  Surgeon: Quentin Ore, MD;  Location: [facility];  Service: General;  Laterality: N/A;   CYSTOSCOPY W/ RETROGRADES Bilateral 06/22/2021   Procedure: CYSTOSCOPY WITH RETROGRADE PYELOGRAM;  Surgeon: Marcine Matar, MD;  Location: [facility] [place];  Service: Urology;  Laterality: Bilateral;   CYSTOSCOPY WITH LITHOLAPAXY N/A 02/26/2013   Procedure: CYSTOSCOPY WITH LITHOLAPAXY;  Surgeon: Marcine Matar, MD;  Location: [facility];  Service: Urology;  Laterality: N/A;   ENDOSCOPIC RETROGRADE CHOLANGIOPANCREATOGRAPHY (ERCP) WITH PROPOFOL N/A 02/22/2022   Procedure: ENDOSCOPIC RETROGRADE CHOLANGIOPANCREATOGRAPHY (ERCP) WITH PROPOFOL;  Surgeon: Jeani Hawking, MD;  Location: [facility]COPY;  Service: Gastroenterology;  Laterality: N/A;   EYE SURGERY  Jan. 2016   cataract surgery both eyes   INGUINAL HERNIA REPAIR Right 11-08-2006   IR KYPHO EA ADDL LEVEL THORACIC OR LUMBAR  02/12/2021   IR RADIOLOGIST EVAL & MGMT  02/18/2021   MASS EXCISION N/A 03/03/2016   Procedure: EXCISION OF BACK  MASS;  Surgeon: Almond Lint, MD;  Location: [facility];  Service: General;  Laterality: N/A;   MOHS SURGERY Left 1/ 2016   Dr Margo Aye-- Basal cell   PROSTATE SURGERY     REMOVAL OF STONES  02/22/2022   Procedure: REMOVAL OF STONES;  Surgeon: Jeani Hawking, MD;  Location: [facility] ENDOSCOPY;  Service: Gastroenterology;;   Dennison Mascot  02/22/2022   Procedure: Dennison Mascot;  Surgeon: Jeani Hawking, MD;  Location: [facility] ENDOSCOPY;  Service: Gastroenterology;;   TRANSURETHRAL RESECTION OF BLADDER TUMOR WITH MITOMYCIN-C N/A 06/22/2021   Procedure: TRANSURETHRAL RESECTION OF BLADDER TUMOR;  Surgeon: Marcine Matar, MD;  Location: [facility];  Service: Urology;  Laterality: N/A;   TRANSURETHRAL RESECTION OF PROSTATE N/A 02/26/2013   Procedure: TRANSURETHRAL RESECTION OF THE PROSTATE WITH GYRUS INSTRUMENTS;  Surgeon: Marcine Matar, MD;  Location: [facility];  Service: Urology;  Laterality: N/A;  TRANSURETHRAL RESECTION OF PROSTATE N/A 06/22/2021   Procedure: TRANSURETHRAL RESECTION OF THE PROSTATE (TURP);  Surgeon: Marcine Matar, MD;  Location: [facility];  Service: Urology;  Laterality: N/A;   Patient Active Problem List   Diagnosis Date Noted   Laceration of right upper extremity 02/24/2023   Dermatitis 02/24/2023   Lung nodules 02/24/2023   Dysuria 08/03/2022    Nonintractable headache 07/01/2022   Bilateral impacted cerumen 06/24/2022   Rash 06/15/2022   Postoperative ileus (HCC) 02/27/2022   Ileus, postoperative (HCC) 02/26/2022   Choledocholithiasis 02/19/2022   DNR (do not resuscitate) 02/19/2022   Left elbow pain 01/26/2022   Mid back pain on left side 01/26/2022   Rib pain 01/26/2022   Acute pain of left shoulder 11/12/2021   Leukocytes in urine 11/12/2021   Urinary frequency 11/12/2021   Thrush 10/08/2021   Hemiplegia, 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   Insomnia    Prediabetes    Acute 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 (HCC)    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   Transaminitis 07/27/2021   UTI (urinary tract infection) 07/27/2021   CVA (cerebral vascular accident) (HCC) 07/27/2021   Fall 07/27/2021   Hyperglycemia 07/27/2021   Cholelithiasis 07/27/2021   Hypoxia 07/27/2021   Nausea and vomiting 07/27/2021   Acute metabolic encephalopathy 07/27/2021   Normocytic anemia 07/27/2021   Chronic back pain 07/27/2021   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   Closed fracture of first lumbar vertebra with routine healing 02/03/2021   Closed fracture of multiple ribs 11/18/2020   Anxiety 01/29/2020   Leg pain, bilateral 01/29/2020   Ingrown toenail 07/13/2019   Lumbar spondylosis 05/02/2018   Pain in left knee 03/09/2018   Osteoarthritis of left hip 01/16/2018   Trochanteric bursitis of left hip 01/16/2018   Preventative health care 09/26/2017   HTN (hypertension) 07/19/2015   Hyperlipidemia 07/19/2015   Great toe pain 02/11/2014   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   Obesity (BMI 30-39.9) 06/25/2013   Renal insufficiency 06/25/2013   Weakness of left arm 06/25/2013   Sebaceous cyst 03/03/2011   Sprain of lumbar region 07/31/2010   Rib pain, left 08/29/2009   Carotid artery stenosis, asymptomatic, bilateral  05/02/2009   Eczema, atopic 05/31/2008   Vitamin D deficiency 03/01/2008   BPH (benign prostatic hyperplasia) 08/06/2007   Fasting hyperglycemia 12/21/2006   History of right MCA infarct 2006    ONSET DATE: 06/29/22 REFERRING DIAG:  Diagnosis  I63.9 (ICD-10-CM) - Cerebrovascular accident (CVA), unspecified mechanism (HCC)  W19.XXXD (ICD-10-CM) - Fall, subsequent encounter    THERAPY DIAG:  No diagnosis found.  Rationale for Evaluation and Treatment: Rehabilitation  SUBJECTIVE:  SUBJECTIVE STATEMENT:    Doing okay, no falls, try to walk to the mail box most days   Per spouse: yesterday was the first day for the bus for [facility], left early and didn't get back until 4:30pm so missed some medicines, he goes again tomorrow and we will keep that from happening again. MD is working with [place] for orders for [facility] to have him up and moving regularly, noticed he was more tired yesterday and today   PERTINENT HISTORY:  BARD NATTRESS is a 82 year old man with dementia, CKD, HTN, CAD, HLD and history of CVA  (2006, 2023), Parkinson's  PAIN:  Are you having pain? Faces/behavior scale- mild pain BLEs   PRECAUTIONS: Fall  WEIGHT BEARING RESTRICTIONS: No  FALLS: Has patient fallen in last 6 months? No  LIVING ENVIRONMENT: Lives with: lives with their family and lives with their spouse Lives in: House/apartment Stairs:  1 brick high step Has following equipment at home: Environmental consultant - 2 wheeled, Wheelchair (manual), Shower bench, bed side commode, Grab bars, and hospital bed, sliding pad for car  PLOF: Independent with basic ADLs and Independent with household mobility without device  PATIENT GOALS: Walk around the block with his wife. Be able to use the dining room chair rather than have to use the W/C.  Increased I with bed mobility and simple tasks at home like make some coffee or brush his teeth, Step over tub to shower, has bench, but he can't really use it. Up and down the one step to get to back deck. 11/23/22: Patient's wife [name] updated his goals today: Walk around store (gets distracted), swing a golf club again (thinking of driving range), get up on his own, walk without leaning forward, more recognition of the left side (from CVA), not need gait belt, OT-- be able to get dressed more on his own, navigate the newspaper.  OBJECTIVE:  TREATMENT: 03/24/23 NuStep L5x68mins Side stepping Marches Hip abduction Seated ball hits  STS  Seated chest press with 3# WaTe  03/15/23 Nustep level 5 x 7 minutes Gait outside with CGA and HHA multiple rests and redirection for big step Stairs step over step up and down with cues and CGA Seated volleyball hands and then kicks Side stepping Marches Hip abduction Fast walking with [facility]  03/08/23 NuStep L5 x 6 minutes Attempted side step over obstacle and then reach up with both hands in OHP, but he had difficulty following the activities Ambulated x 100' holding beach ball, pushing it into OHP, the reaching into rotation to each side a various intervals, min a to include LUE in reach Seated catching and tossing beach ball with weight shifts to each side, tending to lean back. Forward flexion over Blue physioball, with hold, 5 x forward and then to each side. In parallel bars rotation, stepping back with R LE and then reaching back to touch target on the wall, repeat to L. Standing shoulder ext against R tband resistance, small excursion, required max VC and TC to move through larger excursions More standing rotation and reach up to down to each side x 6 reps, CGA. B side stepping, emphasize big steps and changing direction, stable throughout.  03/01/23 Nustep level 5 x 6 minutes UBE level 4 x 4 minutes Marches Hip abduction Step reach,  turn [employer], [employer] Gait fast walking HHA 2x 220 feet Seated PWR moves   TODAY'S TREATMENT 02/22/23  Nustep L3 x6 minutes (decreased resistance due to [place] reporting some  BLE pain this session) intermittent MinA for amplitude of movement  Seated PWR moves (PWR up, reach, rotation, step) with cognitive boosts  Standing PWR moves- lateral reaches  with visual boost with cones, multimodal cues for use of LUE    TODAY'S TREATMENT 02/16/23  Nustep L4x8 minutes cues for big motions all extremities  Walking outside over grass with cues for big steps and foot clearance/step length, slowing pace  MinAx2 for sequencing/initiating transfer off of circular picnic table (turning left and then standing instead of standing up with legs blocked by table) Reaching down to floor to tap cone for wt shift, then up to center to toss ball with focus on upright posture/trunk stretch Alternating seated marches with focus on foot clearance Max cues for foot clearance STS no UEs x4   PATIENT EDUCATION: Education details: none new 02/16/23 Person educated: Patient and Spouse Education method: Explanation, Demonstration, Tactile cues, and Verbal cues Education comprehension: verbalized understanding, returned demonstration, verbal cues required, tactile cues required, and needs further education   ------------------------------------------------------------------------------------------------------- (Measures in this section from initial evaluation unless otherwise noted) DIAGNOSTIC FINDINGS:  MRI 2021 with degenerative changes in lumbar spine, L3-4 R subarticular stenosis COGNITION: Overall cognitive status: History of cognitive impairments - at baseline SENSATION: Not tested COORDINATION: Moderately impaired BLE MUSCLE LENGTH: Hamstrings: severely restricted B Thomas test: Severely restricted B. POSTURE: rounded shoulders, forward head, increased thoracic kyphosis, posterior pelvic tilt, and flexed  trunk  LOWER EXTREMITY ROM:   BLE extremely stiff throughout, limited hip ROM in all planes, B knees limited in extension. LOWER EXTREMITY MMT:  3+/5 throughout. Unable to determine accuracy of MMT due to cognition. Functionally demosntrates poor coordinated activation and poor muscular endurance. BED MOBILITY: Min A for Supine<> Sit. Patient was using a ladder in his hospital bed and could move MI. TRANSFERS: Min A, mod VC, tends to lean back.  11/02/22 independent with use of hands on arm chair with a couple of tries CURB: Min A-Has one step out to his deck. STAIRS: N/A  GAIT: Gait pattern: step to pattern, decreased arm swing- Right, decreased arm swing- Left, decreased step length- Right, decreased step length- Left, shuffling, festinating, trunk flexed, narrow BOS, poor foot clearance- Right, and poor foot clearance- Left Distance walked: 53' Assistive device utilized: Environmental consultant - 2 wheeled Level of assistance: Occasional min A Comments: mod VC to stay close to walker, take longer steps. FUNCTIONAL TESTS:  5 times sit to stand: 38.23  09/29/22  could not performs 5XSTS, 11/02/22 = 47 seconds with cues to use arms Timed up and go (TUG): 47.91  with RW, 07/21/22 TUG 29  seconds without device, 09/29/22 25 seconds, 11/02/22 TUG 20 seconds                                                                     PATIENT EDUCATION: Education details: Progress towards goals, POC Person educated: Patient and Spouse Education method: Programmer, multimedia, Facilities manager, and Verbal cues  Education comprehension: verbalized understanding, returned demonstration, verbal cues required, and needs further education  HOME EXERCISE PROGRAM: PWR! moves  GOALS: Goals reviewed with patient? Yes  SHORT TERM GOALS: Target date: 12/22/22  Perform HEP for PWR! Moves with cues from family. Baseline: Pt's wife, [name], notes walking and hitting  beach ball as main HEP. Goal status: GOAL MET, 12/20/2022   2. Decrease 5 x STS to <  35 sec to demonstrate improved functional strength  Baseline: 47 seconds on 5/21 per note; 34.5 sec UE support 12/20/2022  Goal status: GOAL MET, 12/20/2022   3. Perform sit <> stand transfers from average chair height with Supervision.  Baseline: CGA  12/20/2022; (01/10/23) CGA  Goal status: NOT MET  LONG TERM GOALS: Target date: 01/22/23  Patient will perform HEP progression with caregiver assist. Baseline: has minimal HEP Goal status: ONGOING  2.  Decrease 5 x STS to < 25 sec to demonstrate improved functional strength Baseline: 47 seconds on 5/21 per note; (01/10/23) 36 sec w/ cues for speed/sequence>30.38 sec with BUE supprot Goal status: PARTIALLY MET, 01/24/2023  3. Improve quality of gait to be able to ambulate x 100 feet with supervision. Baseline: CGA with cues  Goal status: REVISED  4.  Patient will perform his bed mobility with MI including rolling, sit<>supine, and scooting in bed. Baseline: on hard surface mat he is independent but on soft bed at home he struggles, wife reports only needs a hand to get up now 11/02/22; (01/10/23) modified indep.  Goal status: MET  5.  Patient will demonstrate floor<>stand with CGA and verbal cues for patient's wife to be able to assist. Baseline:  Has to call 9-1-1; +2 max assist floor>stand with UE support 01/19/2023 Goal status: GOAL NOT MET 01/19/2023 **UPDATED GOALS BELOW ** per recert 01/24/2023  SHORT TERM GOALS: Target date: 02/25/2023   Pt will perform HEP to maintain lower body strength, flexibility, posture with wife's assistance. Baseline:  Goal status: 03/07/23- ongoing   2. Pt will perform sit to stand, 8 of 10 reps with BUE support, with supervision, to maintain safety with transfers.  Baseline: min guard  Goal status: 02/07/23-CGA from elevated surface and max VC to lean forward, ongoing   LONG TERM GOALS: Target date: 04/22/2023   Patient will perform HEP progression to maintain lower body strength, flexibility, posture with wife's  assistance. Baseline:  Goal status: INITIAL  2.  Pt will ambulate 150-175 ft in 2 MWT with min guard assist, to preserve functional mobility within the home.  Baseline: Goal status:  INITIAL  3. Wife will verbalize tips for fall prevention education to minimize fall frequency Baseline:  Goal status: INITIAL   ASSESSMENT:  CLINICAL IMPRESSION:  Patient's wife reports that she had a difficult time with him today.  We did much more walking today outside, with rest breaks and many times had to stop and redirect for bigger steps, he was able to do some step and reach without a lot of difficulty which is a higher level activity  OBJECTIVE IMPAIRMENTS: Abnormal gait, decreased activity tolerance, decreased balance, decreased cognition, decreased coordination, decreased endurance, decreased mobility, difficulty walking, decreased ROM, decreased strength, decreased safety awareness, impaired flexibility, impaired UE functional use, improper body mechanics, and postural dysfunction.   PLAN:  PT FREQUENCY: 1x/week  PT DURATION: 12 weeks  PLANNED INTERVENTIONS: Therapeutic exercises, Therapeutic activity, Neuromuscular re-education, Balance training, Gait training, Patient/Family education, Self Care, Joint mobilization, Stair training, Moist heat, and Manual therapy  PLAN FOR NEXT SESSION:   Continue exercises for lower body strength, sit<>Stand and walking for exercise.  Check about [employer] as part of his routine.   Stacie Glaze, PT 03/23/23 8:36 AM  03/23/23 8:36 AM  [facility] [address] [place], [place], [address] Phone:  [phone]   Fax:  [phone]

## 2023-03-24 ENCOUNTER — Ambulatory Visit: Payer: PPO

## 2023-03-24 DIAGNOSIS — Z9181 History of falling: Secondary | ICD-10-CM

## 2023-03-24 DIAGNOSIS — I69354 Hemiplegia and hemiparesis following cerebral infarction affecting left non-dominant side: Secondary | ICD-10-CM

## 2023-03-24 DIAGNOSIS — R41840 Attention and concentration deficit: Secondary | ICD-10-CM

## 2023-03-24 DIAGNOSIS — M6281 Muscle weakness (generalized): Secondary | ICD-10-CM | POA: Diagnosis not present

## 2023-03-24 DIAGNOSIS — R278 Other lack of coordination: Secondary | ICD-10-CM

## 2023-03-24 DIAGNOSIS — R2689 Other abnormalities of gait and mobility: Secondary | ICD-10-CM

## 2023-03-24 DIAGNOSIS — R262 Difficulty in walking, not elsewhere classified: Secondary | ICD-10-CM

## 2023-03-24 DIAGNOSIS — R296 Repeated falls: Secondary | ICD-10-CM

## 2023-03-28 ENCOUNTER — Other Ambulatory Visit (HOSPITAL_BASED_OUTPATIENT_CLINIC_OR_DEPARTMENT_OTHER): Payer: Self-pay

## 2023-03-29 ENCOUNTER — Ambulatory Visit: Payer: PPO | Admitting: Physical Therapy

## 2023-03-29 ENCOUNTER — Ambulatory Visit (HOSPITAL_BASED_OUTPATIENT_CLINIC_OR_DEPARTMENT_OTHER): Payer: PPO | Admitting: Psychiatry

## 2023-03-29 ENCOUNTER — Other Ambulatory Visit: Payer: Self-pay

## 2023-03-29 ENCOUNTER — Other Ambulatory Visit (HOSPITAL_BASED_OUTPATIENT_CLINIC_OR_DEPARTMENT_OTHER): Payer: Self-pay

## 2023-03-29 VITALS — BP 132/78 | HR 68 | Ht 69.0 in

## 2023-03-29 DIAGNOSIS — F03918 Unspecified dementia, unspecified severity, with other behavioral disturbance: Secondary | ICD-10-CM | POA: Diagnosis not present

## 2023-03-29 DIAGNOSIS — R2689 Other abnormalities of gait and mobility: Secondary | ICD-10-CM

## 2023-03-29 DIAGNOSIS — G4700 Insomnia, unspecified: Secondary | ICD-10-CM

## 2023-03-29 DIAGNOSIS — R262 Difficulty in walking, not elsewhere classified: Secondary | ICD-10-CM

## 2023-03-29 DIAGNOSIS — Z9181 History of falling: Secondary | ICD-10-CM

## 2023-03-29 DIAGNOSIS — I69354 Hemiplegia and hemiparesis following cerebral infarction affecting left non-dominant side: Secondary | ICD-10-CM

## 2023-03-29 DIAGNOSIS — M6281 Muscle weakness (generalized): Secondary | ICD-10-CM | POA: Diagnosis not present

## 2023-03-29 MED ORDER — CARBAMAZEPINE 100 MG/5ML PO SUSP
400.0000 mg | Freq: Every day | ORAL | 3 refills | Status: DC
  Filled 2023-03-29 – 2023-04-01 (×3): qty 600, 30d supply, fill #0
  Filled 2023-05-05: qty 600, 30d supply, fill #1
  Filled 2023-06-01: qty 600, 30d supply, fill #2
  Filled 2023-06-30: qty 600, 30d supply, fill #3

## 2023-03-29 MED ORDER — TRAZODONE HCL 150 MG PO TABS
75.0000 mg | ORAL_TABLET | Freq: Every day | ORAL | 6 refills | Status: DC
  Filled 2023-03-30: qty 20, 40d supply, fill #0
  Filled 2023-05-05: qty 20, 40d supply, fill #1
  Filled 2023-06-06: qty 20, 40d supply, fill #2
  Filled 2023-07-18: qty 20, 40d supply, fill #3

## 2023-03-29 NOTE — Therapy (Signed)
done this session as not done in weeks and pt is new to this therapist so not fair gauge of goals   Patient Details  Name: William [name] MRN: [id number] Date of Birth: 03-14-1941 Referring Provider:  Donato Schultz, *  Encounter Date: 03/29/2023   Suanne Marker, PTA 03/29/2023, 11:45 AM  [facility]. [place], [place], [address] Phone: [phone]   Fax:  [phone]  OUTPATIENT PHYSICAL THERAPY NEURO TREATMENT  Patient Name: William [name] MRN: [id number] DOB:Nov 26, 1940, 82 y.o., male Today's Date: 03/29/2023  PCP: Donato Schultz  DO REFERRING PROVIDER: same   END OF SESSION:  PT End of Session - 03/29/23 1143     Visit Number 56    Number of Visits 66    Date for PT Re-Evaluation 04/22/23    PT Start Time 1150    PT Stop Time 1230    PT Time Calculation (min) 40 min                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   HTN (hypertension) 07/19/2015   Hyperlipidemia    Major neurocognitive disorder 01/09/2014   Mild, related to stroke history   Nocturia    Renal insufficiency 06/25/2013   S/P carotid endarterectomy    BILATERAL ICA--  PATENT PER DUPLEX  05-19-2012   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   Right calf   Urinary frequency    Vitamin D deficiency    Past Surgical History:  Procedure Laterality Date   APPENDECTOMY  AS CHILD   CARDIOVASCULAR STRESS TEST  03-27-2012  DR CRENSHAW   LOW RISK LEXISCAN STUDY-- PROBABLE NORMAL PERFUSION AND SOFT TISSUE ATTENUATION/  NO ISCHEMIA/ EF 51%   CAROTID ENDARTERECTOMY Bilateral LEFT  11-12-2008  DR GREG HAYES   RIGHT ICA  2006  ([facility])   CHOLECYSTECTOMY N/A 02/23/2022   Procedure: LAPAROSCOPIC CHOLECYSTECTOMY;  Surgeon: Quentin Ore, MD;  Location: [facility] OR;  Service: General;  Laterality: N/A;   CYSTOSCOPY W/ RETROGRADES Bilateral 06/22/2021   Procedure: CYSTOSCOPY WITH RETROGRADE PYELOGRAM;  Surgeon: Marcine Matar, MD;  Location:  [facility];  Service: Urology;  Laterality: Bilateral;   CYSTOSCOPY WITH LITHOLAPAXY N/A 02/26/2013   Procedure: CYSTOSCOPY WITH LITHOLAPAXY;  Surgeon: Marcine Matar, MD;  Location: [facility];  Service: Urology;  Laterality: N/A;   ENDOSCOPIC RETROGRADE CHOLANGIOPANCREATOGRAPHY (ERCP) WITH PROPOFOL N/A 02/22/2022   Procedure: ENDOSCOPIC RETROGRADE CHOLANGIOPANCREATOGRAPHY (ERCP) WITH PROPOFOL;  Surgeon: Jeani Hawking, MD;  Location: [facility] ENDOSCOPY;  Service: Gastroenterology;  Laterality: N/A;   EYE SURGERY  Jan. 2016   cataract surgery both eyes   INGUINAL HERNIA REPAIR Right 11-08-2006   IR KYPHO EA ADDL LEVEL THORACIC OR LUMBAR  02/12/2021   IR RADIOLOGIST EVAL & MGMT  02/18/2021   MASS EXCISION N/A 03/03/2016   Procedure: EXCISION OF BACK  MASS;  Surgeon: Almond Lint, MD;  Location:  SURGERY CENTER;  Service: General;  Laterality: N/A;   MOHS SURGERY Left 1/ 2016   Dr Margo Aye-- Basal cell   PROSTATE SURGERY     REMOVAL OF STONES  02/22/2022   Procedure: REMOVAL OF STONES;  Surgeon: Jeani Hawking, MD;  Location: [facility] ENDOSCOPY;  Service: Gastroenterology;;   Dennison Mascot  02/22/2022   Procedure: Dennison Mascot;  Surgeon: Jeani Hawking, MD;  Location: [facility] ENDOSCOPY;  Service: Gastroenterology;;   TRANSURETHRAL RESECTION OF BLADDER TUMOR WITH MITOMYCIN-C N/A 06/22/2021  done this session as not done in weeks and pt is new to this therapist so not fair gauge of goals   Patient Details  Name: William [name] MRN: [id number] Date of Birth: 03-14-1941 Referring Provider:  Donato Schultz, *  Encounter Date: 03/29/2023   Suanne Marker, PTA 03/29/2023, 11:45 AM  [facility] at [facility]. [place], [place], [address] Phone: [phone]   Fax:  [phone]  done this session as not done in weeks and pt is new to this therapist so not fair gauge of goals   Patient Details  Name: William [name] MRN: [id number] Date of Birth: 03-14-1941 Referring Provider:  Donato Schultz, *  Encounter Date: 03/29/2023   Suanne Marker, PTA 03/29/2023, 11:45 AM  [facility]. [place], [place], [address] Phone: [phone]   Fax:  [phone]  OUTPATIENT PHYSICAL THERAPY NEURO TREATMENT  Patient Name: William [name] MRN: [id number] DOB:Nov 26, 1940, 82 y.o., male Today's Date: 03/29/2023  PCP: Donato Schultz  DO REFERRING PROVIDER: same   END OF SESSION:  PT End of Session - 03/29/23 1143     Visit Number 56    Number of Visits 66    Date for PT Re-Evaluation 04/22/23    PT Start Time 1150    PT Stop Time 1230    PT Time Calculation (min) 40 min                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   HTN (hypertension) 07/19/2015   Hyperlipidemia    Major neurocognitive disorder 01/09/2014   Mild, related to stroke history   Nocturia    Renal insufficiency 06/25/2013   S/P carotid endarterectomy    BILATERAL ICA--  PATENT PER DUPLEX  05-19-2012   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   Right calf   Urinary frequency    Vitamin D deficiency    Past Surgical History:  Procedure Laterality Date   APPENDECTOMY  AS CHILD   CARDIOVASCULAR STRESS TEST  03-27-2012  DR CRENSHAW   LOW RISK LEXISCAN STUDY-- PROBABLE NORMAL PERFUSION AND SOFT TISSUE ATTENUATION/  NO ISCHEMIA/ EF 51%   CAROTID ENDARTERECTOMY Bilateral LEFT  11-12-2008  DR GREG HAYES   RIGHT ICA  2006  ([facility])   CHOLECYSTECTOMY N/A 02/23/2022   Procedure: LAPAROSCOPIC CHOLECYSTECTOMY;  Surgeon: Quentin Ore, MD;  Location: [facility] OR;  Service: General;  Laterality: N/A;   CYSTOSCOPY W/ RETROGRADES Bilateral 06/22/2021   Procedure: CYSTOSCOPY WITH RETROGRADE PYELOGRAM;  Surgeon: Marcine Matar, MD;  Location:  [facility];  Service: Urology;  Laterality: Bilateral;   CYSTOSCOPY WITH LITHOLAPAXY N/A 02/26/2013   Procedure: CYSTOSCOPY WITH LITHOLAPAXY;  Surgeon: Marcine Matar, MD;  Location: [facility];  Service: Urology;  Laterality: N/A;   ENDOSCOPIC RETROGRADE CHOLANGIOPANCREATOGRAPHY (ERCP) WITH PROPOFOL N/A 02/22/2022   Procedure: ENDOSCOPIC RETROGRADE CHOLANGIOPANCREATOGRAPHY (ERCP) WITH PROPOFOL;  Surgeon: Jeani Hawking, MD;  Location: [facility] ENDOSCOPY;  Service: Gastroenterology;  Laterality: N/A;   EYE SURGERY  Jan. 2016   cataract surgery both eyes   INGUINAL HERNIA REPAIR Right 11-08-2006   IR KYPHO EA ADDL LEVEL THORACIC OR LUMBAR  02/12/2021   IR RADIOLOGIST EVAL & MGMT  02/18/2021   MASS EXCISION N/A 03/03/2016   Procedure: EXCISION OF BACK  MASS;  Surgeon: Almond Lint, MD;  Location:  SURGERY CENTER;  Service: General;  Laterality: N/A;   MOHS SURGERY Left 1/ 2016   Dr Margo Aye-- Basal cell   PROSTATE SURGERY     REMOVAL OF STONES  02/22/2022   Procedure: REMOVAL OF STONES;  Surgeon: Jeani Hawking, MD;  Location: [facility] ENDOSCOPY;  Service: Gastroenterology;;   Dennison Mascot  02/22/2022   Procedure: Dennison Mascot;  Surgeon: Jeani Hawking, MD;  Location: [facility] ENDOSCOPY;  Service: Gastroenterology;;   TRANSURETHRAL RESECTION OF BLADDER TUMOR WITH MITOMYCIN-C N/A 06/22/2021  done this session as not done in weeks and pt is new to this therapist so not fair gauge of goals   Patient Details  Name: William [name] MRN: [id number] Date of Birth: 03-14-1941 Referring Provider:  Donato Schultz, *  Encounter Date: 03/29/2023   Suanne Marker, PTA 03/29/2023, 11:45 AM  [facility] at [facility]. [place], [place], [address] Phone: [phone]   Fax:  [phone]

## 2023-03-29 NOTE — Progress Notes (Signed)
   Today the patient is seen with his wife [name].  The patient is going to [facility] 3 days a week.  She says in general he is doing fairly well but he does get agitated with her.  Is not clear what the etiology of this is.  He has not had a Tegretol level in years.  He is also being started and is on Sinemet at this time.  Not sure of the effects with the interactions.  For now we will go ahead and get a Tegretol level in the next few days.  He will continue taking Tegretol as ordered continue Ativan and continue Desyrel for sleep.  Patient will also continue going to [facility].  Assessment/plan    Today's date is 03/29/2023.  The patient is diagnosed with Parkinson's dementia.  He is also diagnosed with an adjustment disorder with anxious mood state.  This is a state that requires him to be on a low dose of Ativan that he has been taking now for years.  It is very helpful for sleep.  He also takes a little bit in the morning and some in the afternoon which helps his agitation.  Says both for helping him sleep but also having an antiagitation affect.  The Ativan together with his Tegretol have been very effective.  For sleep he also takes some trazodone.

## 2023-03-30 ENCOUNTER — Other Ambulatory Visit (HOSPITAL_BASED_OUTPATIENT_CLINIC_OR_DEPARTMENT_OTHER): Payer: Self-pay

## 2023-03-31 ENCOUNTER — Other Ambulatory Visit (HOSPITAL_COMMUNITY): Payer: Self-pay

## 2023-03-31 ENCOUNTER — Other Ambulatory Visit (HOSPITAL_COMMUNITY): Payer: Self-pay | Admitting: Psychiatry

## 2023-03-31 ENCOUNTER — Ambulatory Visit (HOSPITAL_COMMUNITY): Payer: PPO

## 2023-03-31 DIAGNOSIS — Z5181 Encounter for therapeutic drug level monitoring: Secondary | ICD-10-CM

## 2023-03-31 NOTE — Progress Notes (Signed)
Patient arrived today with his wife for his due tegretol level. Patient was in a good mood. I prepared his right AC for venipuncture and drew a green top with a 22 g straight needle. Patient tolerated well and without complaint

## 2023-04-01 ENCOUNTER — Other Ambulatory Visit (HOSPITAL_BASED_OUTPATIENT_CLINIC_OR_DEPARTMENT_OTHER): Payer: Self-pay

## 2023-04-04 ENCOUNTER — Telehealth (HOSPITAL_COMMUNITY): Payer: Self-pay

## 2023-04-04 ENCOUNTER — Other Ambulatory Visit (HOSPITAL_COMMUNITY): Payer: Self-pay

## 2023-04-04 ENCOUNTER — Other Ambulatory Visit (HOSPITAL_BASED_OUTPATIENT_CLINIC_OR_DEPARTMENT_OTHER): Payer: Self-pay

## 2023-04-04 DIAGNOSIS — F03918 Unspecified dementia, unspecified severity, with other behavioral disturbance: Secondary | ICD-10-CM

## 2023-04-04 MED ORDER — LORAZEPAM 1 MG PO TABS
ORAL_TABLET | ORAL | 2 refills | Status: DC
  Filled 2023-04-04 – 2023-04-17 (×2): qty 60, 34d supply, fill #0
  Filled 2023-05-22: qty 60, 34d supply, fill #1
  Filled 2023-07-28: qty 60, 30d supply, fill #2

## 2023-04-04 NOTE — Telephone Encounter (Signed)
Patients insurance was denying patients Lorazepam because it was more than 3 tabs a day. After dealing with insurance for over a week, the pharmacist offered an alternative of changing the dose from 0.5 to 1mg . I called [name] and he agreed with the plan and gave verbal permission for me to call in the order. I did this and called the patients wife. I explained what we were doing and she verbalized her understanding. She will call back if there are any questions

## 2023-04-05 ENCOUNTER — Ambulatory Visit: Payer: PPO | Admitting: Physical Therapy

## 2023-04-05 ENCOUNTER — Encounter: Payer: Self-pay | Admitting: Physical Therapy

## 2023-04-05 DIAGNOSIS — I69354 Hemiplegia and hemiparesis following cerebral infarction affecting left non-dominant side: Secondary | ICD-10-CM

## 2023-04-05 DIAGNOSIS — R262 Difficulty in walking, not elsewhere classified: Secondary | ICD-10-CM

## 2023-04-05 DIAGNOSIS — M6281 Muscle weakness (generalized): Secondary | ICD-10-CM

## 2023-04-05 DIAGNOSIS — R296 Repeated falls: Secondary | ICD-10-CM

## 2023-04-05 DIAGNOSIS — Z9181 History of falling: Secondary | ICD-10-CM

## 2023-04-05 DIAGNOSIS — R2689 Other abnormalities of gait and mobility: Secondary | ICD-10-CM

## 2023-04-05 NOTE — Therapy (Signed)
OUTPATIENT PHYSICAL THERAPY NEURO TREATMENT  Patient Name: William [name] MRN: [id number] DOB:Dec 01, 1940, 82 y.o., male Today's Date: 04/05/2023  PCP: Donato Schultz  DO REFERRING PROVIDER: same   END OF SESSION:  PT End of Session - 04/05/23 1535     Visit Number 57    Number of Visits 66    Date for PT Re-Evaluation 04/22/23    Authorization Type HTA    PT Start Time 1528    PT Stop Time 1612    PT Time Calculation (min) 44 min    Equipment Utilized During Treatment Gait belt    Activity Tolerance Patient tolerated treatment well    Behavior During Therapy WFL for tasks assessed/performed                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   HTN (hypertension) 07/19/2015   Hyperlipidemia    Major neurocognitive disorder 01/09/2014   Mild, related to stroke history   Nocturia    Renal insufficiency 06/25/2013   S/P carotid endarterectomy    BILATERAL ICA--  PATENT PER DUPLEX  05-19-2012   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   Right calf   Urinary frequency    Vitamin D deficiency    Past Surgical History:  Procedure Laterality Date   APPENDECTOMY  AS CHILD   CARDIOVASCULAR STRESS TEST  03-27-2012  DR CRENSHAW   LOW RISK LEXISCAN STUDY-- PROBABLE NORMAL PERFUSION AND SOFT TISSUE ATTENUATION/  NO ISCHEMIA/ EF 51%   CAROTID ENDARTERECTOMY Bilateral LEFT  11-12-2008  DR GREG HAYES   RIGHT ICA  2006  ([facility])   CHOLECYSTECTOMY N/A 02/23/2022   Procedure: LAPAROSCOPIC CHOLECYSTECTOMY;  Surgeon: Quentin Ore, MD;   Location: [facility];  Service: General;  Laterality: N/A;   CYSTOSCOPY W/ RETROGRADES Bilateral 06/22/2021   Procedure: CYSTOSCOPY WITH RETROGRADE PYELOGRAM;  Surgeon: Marcine Matar, MD;  Location: [facility];  Service: Urology;  Laterality: Bilateral;   CYSTOSCOPY WITH LITHOLAPAXY N/A 02/26/2013   Procedure: CYSTOSCOPY WITH LITHOLAPAXY;  Surgeon: Marcine Matar, MD;  Location: [facility];  Service: Urology;  Laterality: N/A;   ENDOSCOPIC RETROGRADE CHOLANGIOPANCREATOGRAPHY (ERCP) WITH PROPOFOL N/A 02/22/2022   Procedure: ENDOSCOPIC RETROGRADE CHOLANGIOPANCREATOGRAPHY (ERCP) WITH PROPOFOL;  Surgeon: Jeani Hawking, MD;  Location: [facility] ENDOSCOPY;  Service: Gastroenterology;  Laterality: N/A;   EYE SURGERY  Jan. 2016   cataract surgery both eyes   INGUINAL HERNIA REPAIR Right 11-08-2006   IR KYPHO EA ADDL LEVEL THORACIC OR LUMBAR  02/12/2021   IR RADIOLOGIST EVAL & MGMT  02/18/2021   MASS EXCISION N/A 03/03/2016   Procedure: EXCISION OF BACK  MASS;  Surgeon: Almond Lint, MD;  Location: [facility];  Service: General;  Laterality: N/A;   MOHS SURGERY Left 1/ 2016   Dr Margo Aye-- Basal cell   PROSTATE SURGERY     REMOVAL OF STONES  02/22/2022   Procedure: REMOVAL OF STONES;  Surgeon: Jeani Hawking, MD;  Location: [facility] ENDOSCOPY;  Service:  PLANNED INTERVENTIONS: Therapeutic exercises, Therapeutic activity, Neuromuscular re-education, Balance training, Gait training, Patient/Family education, Self Care, Joint mobilization, Stair training, Moist heat, and Manual therapy  PLAN FOR NEXT SESSION:   Continue exercises for lower body strength, sit<>Stand and walking for exercise.  Check about [employer] as part of his routine.  Goals need assessed not done this session as not done in weeks and pt is new to this therapist so not fair gauge of goals   Patient Details  Name: William [name] MRN: [id number] Date of Birth: [birth year] Referring Provider:  Donato Schultz, *  Encounter Date: 04/05/2023   Jearld Lesch, PT 04/05/2023, 3:35 PM  [facility]. [place], [place], [address] Phone: [phone]   Fax:  [phone]  OUTPATIENT PHYSICAL THERAPY NEURO TREATMENT  Patient Name: William [name] MRN: [id number] DOB:Dec 01, 1940, 82 y.o., male Today's Date: 04/05/2023  PCP: Donato Schultz  DO REFERRING PROVIDER: same   END OF SESSION:  PT End of Session - 04/05/23 1535     Visit Number 57    Number of Visits 66    Date for PT Re-Evaluation 04/22/23    Authorization Type HTA    PT Start Time 1528    PT Stop Time 1612    PT Time Calculation (min) 44 min    Equipment Utilized During Treatment Gait belt    Activity Tolerance Patient tolerated treatment well    Behavior During Therapy WFL for tasks assessed/performed                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   HTN (hypertension) 07/19/2015   Hyperlipidemia    Major neurocognitive disorder 01/09/2014   Mild, related to stroke history   Nocturia    Renal insufficiency 06/25/2013   S/P carotid endarterectomy    BILATERAL ICA--  PATENT PER DUPLEX  05-19-2012   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   Right calf   Urinary frequency    Vitamin D deficiency    Past Surgical History:  Procedure Laterality Date   APPENDECTOMY  AS CHILD   CARDIOVASCULAR STRESS TEST  03-27-2012  DR CRENSHAW   LOW RISK LEXISCAN STUDY-- PROBABLE NORMAL PERFUSION AND SOFT TISSUE ATTENUATION/  NO ISCHEMIA/ EF 51%   CAROTID ENDARTERECTOMY Bilateral LEFT  11-12-2008  DR GREG HAYES   RIGHT ICA  2006  ([facility])   CHOLECYSTECTOMY N/A 02/23/2022   Procedure: LAPAROSCOPIC CHOLECYSTECTOMY;  Surgeon: Quentin Ore, MD;   Location: [facility];  Service: General;  Laterality: N/A;   CYSTOSCOPY W/ RETROGRADES Bilateral 06/22/2021   Procedure: CYSTOSCOPY WITH RETROGRADE PYELOGRAM;  Surgeon: Marcine Matar, MD;  Location: [facility];  Service: Urology;  Laterality: Bilateral;   CYSTOSCOPY WITH LITHOLAPAXY N/A 02/26/2013   Procedure: CYSTOSCOPY WITH LITHOLAPAXY;  Surgeon: Marcine Matar, MD;  Location: [facility];  Service: Urology;  Laterality: N/A;   ENDOSCOPIC RETROGRADE CHOLANGIOPANCREATOGRAPHY (ERCP) WITH PROPOFOL N/A 02/22/2022   Procedure: ENDOSCOPIC RETROGRADE CHOLANGIOPANCREATOGRAPHY (ERCP) WITH PROPOFOL;  Surgeon: Jeani Hawking, MD;  Location: [facility] ENDOSCOPY;  Service: Gastroenterology;  Laterality: N/A;   EYE SURGERY  Jan. 2016   cataract surgery both eyes   INGUINAL HERNIA REPAIR Right 11-08-2006   IR KYPHO EA ADDL LEVEL THORACIC OR LUMBAR  02/12/2021   IR RADIOLOGIST EVAL & MGMT  02/18/2021   MASS EXCISION N/A 03/03/2016   Procedure: EXCISION OF BACK  MASS;  Surgeon: Almond Lint, MD;  Location: [facility];  Service: General;  Laterality: N/A;   MOHS SURGERY Left 1/ 2016   Dr Margo Aye-- Basal cell   PROSTATE SURGERY     REMOVAL OF STONES  02/22/2022   Procedure: REMOVAL OF STONES;  Surgeon: Jeani Hawking, MD;  Location: [facility]COPY;  Service:  OUTPATIENT PHYSICAL THERAPY NEURO TREATMENT  Patient Name: William [name] MRN: [id number] DOB:Dec 01, 1940, 82 y.o., male Today's Date: 04/05/2023  PCP: Donato Schultz  DO REFERRING PROVIDER: same   END OF SESSION:  PT End of Session - 04/05/23 1535     Visit Number 57    Number of Visits 66    Date for PT Re-Evaluation 04/22/23    Authorization Type HTA    PT Start Time 1528    PT Stop Time 1612    PT Time Calculation (min) 44 min    Equipment Utilized During Treatment Gait belt    Activity Tolerance Patient tolerated treatment well    Behavior During Therapy WFL for tasks assessed/performed                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   HTN (hypertension) 07/19/2015   Hyperlipidemia    Major neurocognitive disorder 01/09/2014   Mild, related to stroke history   Nocturia    Renal insufficiency 06/25/2013   S/P carotid endarterectomy    BILATERAL ICA--  PATENT PER DUPLEX  05-19-2012   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   Right calf   Urinary frequency    Vitamin D deficiency    Past Surgical History:  Procedure Laterality Date   APPENDECTOMY  AS CHILD   CARDIOVASCULAR STRESS TEST  03-27-2012  DR CRENSHAW   LOW RISK LEXISCAN STUDY-- PROBABLE NORMAL PERFUSION AND SOFT TISSUE ATTENUATION/  NO ISCHEMIA/ EF 51%   CAROTID ENDARTERECTOMY Bilateral LEFT  11-12-2008  DR GREG HAYES   RIGHT ICA  2006  ([facility])   CHOLECYSTECTOMY N/A 02/23/2022   Procedure: LAPAROSCOPIC CHOLECYSTECTOMY;  Surgeon: Quentin Ore, MD;   Location: [facility];  Service: General;  Laterality: N/A;   CYSTOSCOPY W/ RETROGRADES Bilateral 06/22/2021   Procedure: CYSTOSCOPY WITH RETROGRADE PYELOGRAM;  Surgeon: Marcine Matar, MD;  Location: [facility];  Service: Urology;  Laterality: Bilateral;   CYSTOSCOPY WITH LITHOLAPAXY N/A 02/26/2013   Procedure: CYSTOSCOPY WITH LITHOLAPAXY;  Surgeon: Marcine Matar, MD;  Location: [facility];  Service: Urology;  Laterality: N/A;   ENDOSCOPIC RETROGRADE CHOLANGIOPANCREATOGRAPHY (ERCP) WITH PROPOFOL N/A 02/22/2022   Procedure: ENDOSCOPIC RETROGRADE CHOLANGIOPANCREATOGRAPHY (ERCP) WITH PROPOFOL;  Surgeon: Jeani Hawking, MD;  Location: [facility] ENDOSCOPY;  Service: Gastroenterology;  Laterality: N/A;   EYE SURGERY  Jan. 2016   cataract surgery both eyes   INGUINAL HERNIA REPAIR Right 11-08-2006   IR KYPHO EA ADDL LEVEL THORACIC OR LUMBAR  02/12/2021   IR RADIOLOGIST EVAL & MGMT  02/18/2021   MASS EXCISION N/A 03/03/2016   Procedure: EXCISION OF BACK  MASS;  Surgeon: Almond Lint, MD;  Location: [facility];  Service: General;  Laterality: N/A;   MOHS SURGERY Left 1/ 2016   Dr Margo Aye-- Basal cell   PROSTATE SURGERY     REMOVAL OF STONES  02/22/2022   Procedure: REMOVAL OF STONES;  Surgeon: Jeani Hawking, MD;  Location: [facility];  Service:  PLANNED INTERVENTIONS: Therapeutic exercises, Therapeutic activity, Neuromuscular re-education, Balance training, Gait training, Patient/Family education, Self Care, Joint mobilization, Stair training, Moist heat, and Manual therapy  PLAN FOR NEXT SESSION:   Continue exercises for lower body strength, sit<>Stand and walking for exercise.  Check about [employer] as part of his routine.  Goals need assessed not done this session as not done in weeks and pt is new to this therapist so not fair gauge of goals   Patient Details  Name: William [name] MRN: [id number] Date of Birth: [birth year] Referring Provider:  Donato Schultz, *  Encounter Date: 04/05/2023   Jearld Lesch, PT 04/05/2023, 3:35 PM  [facility] [place], [place], [address] Phone: [phone]   Fax:  [phone]  OUTPATIENT PHYSICAL THERAPY NEURO TREATMENT  Patient Name: William [name] MRN: [id number] DOB:Dec 01, 1940, 82 y.o., male Today's Date: 04/05/2023  PCP: Donato Schultz  DO REFERRING PROVIDER: same   END OF SESSION:  PT End of Session - 04/05/23 1535     Visit Number 57    Number of Visits 66    Date for PT Re-Evaluation 04/22/23    Authorization Type HTA    PT Start Time 1528    PT Stop Time 1612    PT Time Calculation (min) 44 min    Equipment Utilized During Treatment Gait belt    Activity Tolerance Patient tolerated treatment well    Behavior During Therapy WFL for tasks assessed/performed                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   HTN (hypertension) 07/19/2015   Hyperlipidemia    Major neurocognitive disorder 01/09/2014   Mild, related to stroke history   Nocturia    Renal insufficiency 06/25/2013   S/P carotid endarterectomy    BILATERAL ICA--  PATENT PER DUPLEX  05-19-2012   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   Right calf   Urinary frequency    Vitamin D deficiency    Past Surgical History:  Procedure Laterality Date   APPENDECTOMY  AS CHILD   CARDIOVASCULAR STRESS TEST  03-27-2012  DR CRENSHAW   LOW RISK LEXISCAN STUDY-- PROBABLE NORMAL PERFUSION AND SOFT TISSUE ATTENUATION/  NO ISCHEMIA/ EF 51%   CAROTID ENDARTERECTOMY Bilateral LEFT  11-12-2008  DR GREG HAYES   RIGHT ICA  2006  ([facility])   CHOLECYSTECTOMY N/A 02/23/2022   Procedure: LAPAROSCOPIC CHOLECYSTECTOMY;  Surgeon: Quentin Ore, MD;   Location: [facility];  Service: General;  Laterality: N/A;   CYSTOSCOPY W/ RETROGRADES Bilateral 06/22/2021   Procedure: CYSTOSCOPY WITH RETROGRADE PYELOGRAM;  Surgeon: Marcine Matar, MD;  Location: [facility];  Service: Urology;  Laterality: Bilateral;   CYSTOSCOPY WITH LITHOLAPAXY N/A 02/26/2013   Procedure: CYSTOSCOPY WITH LITHOLAPAXY;  Surgeon: Marcine Matar, MD;  Location: [facility];  Service: Urology;  Laterality: N/A;   ENDOSCOPIC RETROGRADE CHOLANGIOPANCREATOGRAPHY (ERCP) WITH PROPOFOL N/A 02/22/2022   Procedure: ENDOSCOPIC RETROGRADE CHOLANGIOPANCREATOGRAPHY (ERCP) WITH PROPOFOL;  Surgeon: Jeani Hawking, MD;  Location: [facility] ENDOSCOPY;  Service: Gastroenterology;  Laterality: N/A;   EYE SURGERY  Jan. 2016   cataract surgery both eyes   INGUINAL HERNIA REPAIR Right 11-08-2006   IR KYPHO EA ADDL LEVEL THORACIC OR LUMBAR  02/12/2021   IR RADIOLOGIST EVAL & MGMT  02/18/2021   MASS EXCISION N/A 03/03/2016   Procedure: EXCISION OF BACK  MASS;  Surgeon: Almond Lint, MD;  Location: [facility];  Service: General;  Laterality: N/A;   MOHS SURGERY Left 1/ 2016   Dr Margo Aye-- Basal cell   PROSTATE SURGERY     REMOVAL OF STONES  02/22/2022   Procedure: REMOVAL OF STONES;  Surgeon: Jeani Hawking, MD;  Location: [facility]COPY;  Service:

## 2023-04-10 ENCOUNTER — Other Ambulatory Visit: Payer: Self-pay | Admitting: Family Medicine

## 2023-04-11 ENCOUNTER — Other Ambulatory Visit (HOSPITAL_BASED_OUTPATIENT_CLINIC_OR_DEPARTMENT_OTHER): Payer: Self-pay

## 2023-04-11 ENCOUNTER — Other Ambulatory Visit: Payer: Self-pay

## 2023-04-11 MED ORDER — TAMSULOSIN HCL 0.4 MG PO CAPS
0.4000 mg | ORAL_CAPSULE | Freq: Every day | ORAL | 1 refills | Status: DC
  Filled 2023-04-11: qty 90, 90d supply, fill #0
  Filled 2023-08-31 – 2024-01-09 (×2): qty 90, 90d supply, fill #1

## 2023-04-12 ENCOUNTER — Encounter: Payer: Self-pay | Admitting: Physical Therapy

## 2023-04-12 ENCOUNTER — Ambulatory Visit: Payer: PPO | Admitting: Physical Therapy

## 2023-04-12 DIAGNOSIS — M6281 Muscle weakness (generalized): Secondary | ICD-10-CM

## 2023-04-12 DIAGNOSIS — R296 Repeated falls: Secondary | ICD-10-CM

## 2023-04-12 DIAGNOSIS — R262 Difficulty in walking, not elsewhere classified: Secondary | ICD-10-CM

## 2023-04-12 DIAGNOSIS — I69354 Hemiplegia and hemiparesis following cerebral infarction affecting left non-dominant side: Secondary | ICD-10-CM

## 2023-04-12 DIAGNOSIS — R2689 Other abnormalities of gait and mobility: Secondary | ICD-10-CM

## 2023-04-12 DIAGNOSIS — Z9181 History of falling: Secondary | ICD-10-CM

## 2023-04-12 NOTE — Therapy (Signed)
OUTPATIENT PHYSICAL THERAPY NEURO TREATMENT  Patient Name: William [name] MRN: [id number] DOB:Mar 02, 1941, 82 y.o., male Today's Date: 04/12/2023  PCP: Donato Schultz  DO REFERRING PROVIDER: same   END OF SESSION:  PT End of Session - 04/12/23 1538     Visit Number 58    Number of Visits 66    Date for PT Re-Evaluation 04/22/23    Authorization Type HTA    PT Start Time 1530    PT Stop Time 1613    PT Time Calculation (min) 43 min    Equipment Utilized During Treatment Gait belt    Activity Tolerance Patient tolerated treatment well                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   HTN (hypertension) 07/19/2015   Hyperlipidemia    Major neurocognitive disorder 01/09/2014   Mild, related to stroke history   Nocturia    Renal insufficiency 06/25/2013   S/P carotid endarterectomy    BILATERAL ICA--  PATENT PER DUPLEX  05-19-2012   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   Right calf   Urinary frequency    Vitamin D deficiency    Past Surgical History:  Procedure Laterality Date   APPENDECTOMY  AS CHILD   CARDIOVASCULAR STRESS TEST  03-27-2012  DR CRENSHAW   LOW RISK LEXISCAN STUDY-- PROBABLE NORMAL PERFUSION AND SOFT TISSUE ATTENUATION/  NO ISCHEMIA/ EF 51%   CAROTID ENDARTERECTOMY Bilateral LEFT  11-12-2008  DR GREG HAYES   RIGHT ICA  2006  ([facility])   CHOLECYSTECTOMY N/A 02/23/2022   Procedure: LAPAROSCOPIC CHOLECYSTECTOMY;  Surgeon: Quentin Ore, MD;  Location: [facility];  Service: General;  Laterality: N/A;   CYSTOSCOPY  W/ RETROGRADES Bilateral 06/22/2021   Procedure: CYSTOSCOPY WITH RETROGRADE PYELOGRAM;  Surgeon: Marcine Matar, MD;  Location: [facility];  Service: Urology;  Laterality: Bilateral;   CYSTOSCOPY WITH LITHOLAPAXY N/A 02/26/2013   Procedure: CYSTOSCOPY WITH LITHOLAPAXY;  Surgeon: Marcine Matar, MD;  Location: [facility];  Service: Urology;  Laterality: N/A;   ENDOSCOPIC RETROGRADE CHOLANGIOPANCREATOGRAPHY (ERCP) WITH PROPOFOL N/A 02/22/2022   Procedure: ENDOSCOPIC RETROGRADE CHOLANGIOPANCREATOGRAPHY (ERCP) WITH PROPOFOL;  Surgeon: Jeani Hawking, MD;  Location: [facility] ENDOSCOPY;  Service: Gastroenterology;  Laterality: N/A;   EYE SURGERY  Jan. 2016   cataract surgery both eyes   INGUINAL HERNIA REPAIR Right 11-08-2006   IR KYPHO EA ADDL LEVEL THORACIC OR LUMBAR  02/12/2021   IR RADIOLOGIST EVAL & MGMT  02/18/2021   MASS EXCISION N/A 03/03/2016   Procedure: EXCISION OF BACK  MASS;  Surgeon: Almond Lint, MD;  Location: [facility];  Service: General;  Laterality: N/A;   MOHS SURGERY Left 1/ 2016   Dr Margo Aye-- Basal cell   PROSTATE SURGERY     REMOVAL OF STONES  02/22/2022   Procedure: REMOVAL OF STONES;  Surgeon: Jeani Hawking, MD;  Location: [facility] ENDOSCOPY;  Service: Gastroenterology;;   Dennison Mascot  02/22/2022   Procedure: SPHINCTEROTOMY;  OUTPATIENT PHYSICAL THERAPY NEURO TREATMENT  Patient Name: William [name] MRN: [id number] DOB:Mar 02, 1941, 82 y.o., male Today's Date: 04/12/2023  PCP: Donato Schultz  DO REFERRING PROVIDER: same   END OF SESSION:  PT End of Session - 04/12/23 1538     Visit Number 58    Number of Visits 66    Date for PT Re-Evaluation 04/22/23    Authorization Type HTA    PT Start Time 1530    PT Stop Time 1613    PT Time Calculation (min) 43 min    Equipment Utilized During Treatment Gait belt    Activity Tolerance Patient tolerated treatment well                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   HTN (hypertension) 07/19/2015   Hyperlipidemia    Major neurocognitive disorder 01/09/2014   Mild, related to stroke history   Nocturia    Renal insufficiency 06/25/2013   S/P carotid endarterectomy    BILATERAL ICA--  PATENT PER DUPLEX  05-19-2012   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   Right calf   Urinary frequency    Vitamin D deficiency    Past Surgical History:  Procedure Laterality Date   APPENDECTOMY  AS CHILD   CARDIOVASCULAR STRESS TEST  03-27-2012  DR CRENSHAW   LOW RISK LEXISCAN STUDY-- PROBABLE NORMAL PERFUSION AND SOFT TISSUE ATTENUATION/  NO ISCHEMIA/ EF 51%   CAROTID ENDARTERECTOMY Bilateral LEFT  11-12-2008  DR GREG HAYES   RIGHT ICA  2006  ([facility])   CHOLECYSTECTOMY N/A 02/23/2022   Procedure: LAPAROSCOPIC CHOLECYSTECTOMY;  Surgeon: Quentin Ore, MD;  Location: [facility];  Service: General;  Laterality: N/A;   CYSTOSCOPY  W/ RETROGRADES Bilateral 06/22/2021   Procedure: CYSTOSCOPY WITH RETROGRADE PYELOGRAM;  Surgeon: Marcine Matar, MD;  Location: [facility];  Service: Urology;  Laterality: Bilateral;   CYSTOSCOPY WITH LITHOLAPAXY N/A 02/26/2013   Procedure: CYSTOSCOPY WITH LITHOLAPAXY;  Surgeon: Marcine Matar, MD;  Location: [facility];  Service: Urology;  Laterality: N/A;   ENDOSCOPIC RETROGRADE CHOLANGIOPANCREATOGRAPHY (ERCP) WITH PROPOFOL N/A 02/22/2022   Procedure: ENDOSCOPIC RETROGRADE CHOLANGIOPANCREATOGRAPHY (ERCP) WITH PROPOFOL;  Surgeon: Jeani Hawking, MD;  Location: [facility] ENDOSCOPY;  Service: Gastroenterology;  Laterality: N/A;   EYE SURGERY  Jan. 2016   cataract surgery both eyes   INGUINAL HERNIA REPAIR Right 11-08-2006   IR KYPHO EA ADDL LEVEL THORACIC OR LUMBAR  02/12/2021   IR RADIOLOGIST EVAL & MGMT  02/18/2021   MASS EXCISION N/A 03/03/2016   Procedure: EXCISION OF BACK  MASS;  Surgeon: Almond Lint, MD;  Location: [facility];  Service: General;  Laterality: N/A;   MOHS SURGERY Left 1/ 2016   Dr Margo Aye-- Basal cell   PROSTATE SURGERY     REMOVAL OF STONES  02/22/2022   Procedure: REMOVAL OF STONES;  Surgeon: Jeani Hawking, MD;  Location: [facility] ENDOSCOPY;  Service: Gastroenterology;;   Dennison Mascot  02/22/2022   Procedure: SPHINCTEROTOMY;  OUTPATIENT PHYSICAL THERAPY NEURO TREATMENT  Patient Name: William [name] MRN: [id number] DOB:Mar 02, 1941, 82 y.o., male Today's Date: 04/12/2023  PCP: Donato Schultz  DO REFERRING PROVIDER: same   END OF SESSION:  PT End of Session - 04/12/23 1538     Visit Number 58    Number of Visits 66    Date for PT Re-Evaluation 04/22/23    Authorization Type HTA    PT Start Time 1530    PT Stop Time 1613    PT Time Calculation (min) 43 min    Equipment Utilized During Treatment Gait belt    Activity Tolerance Patient tolerated treatment well                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   HTN (hypertension) 07/19/2015   Hyperlipidemia    Major neurocognitive disorder 01/09/2014   Mild, related to stroke history   Nocturia    Renal insufficiency 06/25/2013   S/P carotid endarterectomy    BILATERAL ICA--  PATENT PER DUPLEX  05-19-2012   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   Right calf   Urinary frequency    Vitamin D deficiency    Past Surgical History:  Procedure Laterality Date   APPENDECTOMY  AS CHILD   CARDIOVASCULAR STRESS TEST  03-27-2012  DR CRENSHAW   LOW RISK LEXISCAN STUDY-- PROBABLE NORMAL PERFUSION AND SOFT TISSUE ATTENUATION/  NO ISCHEMIA/ EF 51%   CAROTID ENDARTERECTOMY Bilateral LEFT  11-12-2008  DR GREG HAYES   RIGHT ICA  2006  ([facility])   CHOLECYSTECTOMY N/A 02/23/2022   Procedure: LAPAROSCOPIC CHOLECYSTECTOMY;  Surgeon: Quentin Ore, MD;  Location: [facility];  Service: General;  Laterality: N/A;   CYSTOSCOPY  W/ RETROGRADES Bilateral 06/22/2021   Procedure: CYSTOSCOPY WITH RETROGRADE PYELOGRAM;  Surgeon: Marcine Matar, MD;  Location: [facility];  Service: Urology;  Laterality: Bilateral;   CYSTOSCOPY WITH LITHOLAPAXY N/A 02/26/2013   Procedure: CYSTOSCOPY WITH LITHOLAPAXY;  Surgeon: Marcine Matar, MD;  Location: [facility];  Service: Urology;  Laterality: N/A;   ENDOSCOPIC RETROGRADE CHOLANGIOPANCREATOGRAPHY (ERCP) WITH PROPOFOL N/A 02/22/2022   Procedure: ENDOSCOPIC RETROGRADE CHOLANGIOPANCREATOGRAPHY (ERCP) WITH PROPOFOL;  Surgeon: Jeani Hawking, MD;  Location: [facility] ENDOSCOPY;  Service: Gastroenterology;  Laterality: N/A;   EYE SURGERY  Jan. 2016   cataract surgery both eyes   INGUINAL HERNIA REPAIR Right 11-08-2006   IR KYPHO EA ADDL LEVEL THORACIC OR LUMBAR  02/12/2021   IR RADIOLOGIST EVAL & MGMT  02/18/2021   MASS EXCISION N/A 03/03/2016   Procedure: EXCISION OF BACK  MASS;  Surgeon: Almond Lint, MD;  Location: [facility];  Service: General;  Laterality: N/A;   MOHS SURGERY Left 1/ 2016   Dr Margo Aye-- Basal cell   PROSTATE SURGERY     REMOVAL OF STONES  02/22/2022   Procedure: REMOVAL OF STONES;  Surgeon: Jeani Hawking, MD;  Location: [facility] ENDOSCOPY;  Service: Gastroenterology;;   Dennison Mascot  02/22/2022   Procedure: SPHINCTEROTOMY;  the left hand more today  OBJECTIVE IMPAIRMENTS: Abnormal gait, decreased activity tolerance, decreased balance, decreased cognition, decreased coordination, decreased endurance, decreased mobility, difficulty walking, decreased ROM, decreased strength, decreased safety awareness, impaired flexibility, impaired UE functional use, improper body mechanics, and postural dysfunction.   PLAN:  PT FREQUENCY: 1x/week  PT DURATION: 12 weeks  PLANNED INTERVENTIONS: Therapeutic exercises, Therapeutic activity, Neuromuscular re-education, Balance training, Gait training, Patient/Family education, Self Care, Joint mobilization, Stair training, Moist heat, and Manual therapy  PLAN FOR NEXT SESSION:    Continue to work on strength and overall function as well as movements  Patient Details  Name: William [name] MRN: [id number] Date of Birth: [birth year] Referring Provider:  Donato Schultz, *  Encounter Date: 04/12/2023   Jearld Lesch, PT 04/12/2023, 3:53 PM  [facility] [address] [facility] [place], [place], [address] Phone: [phone]   Fax:  [phone]  the left hand more today  OBJECTIVE IMPAIRMENTS: Abnormal gait, decreased activity tolerance, decreased balance, decreased cognition, decreased coordination, decreased endurance, decreased mobility, difficulty walking, decreased ROM, decreased strength, decreased safety awareness, impaired flexibility, impaired UE functional use, improper body mechanics, and postural dysfunction.   PLAN:  PT FREQUENCY: 1x/week  PT DURATION: 12 weeks  PLANNED INTERVENTIONS: Therapeutic exercises, Therapeutic activity, Neuromuscular re-education, Balance training, Gait training, Patient/Family education, Self Care, Joint mobilization, Stair training, Moist heat, and Manual therapy  PLAN FOR NEXT SESSION:    Continue to work on strength and overall function as well as movements  Patient Details  Name: William [name] MRN: [id number] Date of Birth: [birth year] Referring Provider:  Donato Schultz, *  Encounter Date: 04/12/2023   Jearld Lesch, PT 04/12/2023, 3:53 PM  [facility] [address] [facility] [place], [place], [address] Phone: [phone]   Fax:  [phone]  OUTPATIENT PHYSICAL THERAPY NEURO TREATMENT  Patient Name: William [name] MRN: [id number] DOB:Mar 02, 1941, 82 y.o., male Today's Date: 04/12/2023  PCP: Donato Schultz  DO REFERRING PROVIDER: same   END OF SESSION:  PT End of Session - 04/12/23 1538     Visit Number 58    Number of Visits 66    Date for PT Re-Evaluation 04/22/23    Authorization Type HTA    PT Start Time 1530    PT Stop Time 1613    PT Time Calculation (min) 43 min    Equipment Utilized During Treatment Gait belt    Activity Tolerance Patient tolerated treatment well                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   HTN (hypertension) 07/19/2015   Hyperlipidemia    Major neurocognitive disorder 01/09/2014   Mild, related to stroke history   Nocturia    Renal insufficiency 06/25/2013   S/P carotid endarterectomy    BILATERAL ICA--  PATENT PER DUPLEX  05-19-2012   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   Right calf   Urinary frequency    Vitamin D deficiency    Past Surgical History:  Procedure Laterality Date   APPENDECTOMY  AS CHILD   CARDIOVASCULAR STRESS TEST  03-27-2012  DR CRENSHAW   LOW RISK LEXISCAN STUDY-- PROBABLE NORMAL PERFUSION AND SOFT TISSUE ATTENUATION/  NO ISCHEMIA/ EF 51%   CAROTID ENDARTERECTOMY Bilateral LEFT  11-12-2008  DR GREG HAYES   RIGHT ICA  2006  ([facility])   CHOLECYSTECTOMY N/A 02/23/2022   Procedure: LAPAROSCOPIC CHOLECYSTECTOMY;  Surgeon: Quentin Ore, MD;  Location: [facility];  Service: General;  Laterality: N/A;   CYSTOSCOPY  W/ RETROGRADES Bilateral 06/22/2021   Procedure: CYSTOSCOPY WITH RETROGRADE PYELOGRAM;  Surgeon: Marcine Matar, MD;  Location: [facility];  Service: Urology;  Laterality: Bilateral;   CYSTOSCOPY WITH LITHOLAPAXY N/A 02/26/2013   Procedure: CYSTOSCOPY WITH LITHOLAPAXY;  Surgeon: Marcine Matar, MD;  Location: [facility];  Service: Urology;  Laterality: N/A;   ENDOSCOPIC RETROGRADE CHOLANGIOPANCREATOGRAPHY (ERCP) WITH PROPOFOL N/A 02/22/2022   Procedure: ENDOSCOPIC RETROGRADE CHOLANGIOPANCREATOGRAPHY (ERCP) WITH PROPOFOL;  Surgeon: Jeani Hawking, MD;  Location: [facility] ENDOSCOPY;  Service: Gastroenterology;  Laterality: N/A;   EYE SURGERY  Jan. 2016   cataract surgery both eyes   INGUINAL HERNIA REPAIR Right 11-08-2006   IR KYPHO EA ADDL LEVEL THORACIC OR LUMBAR  02/12/2021   IR RADIOLOGIST EVAL & MGMT  02/18/2021   MASS EXCISION N/A 03/03/2016   Procedure: EXCISION OF BACK  MASS;  Surgeon: Almond Lint, MD;  Location: [facility];  Service: General;  Laterality: N/A;   MOHS SURGERY Left 1/ 2016   Dr Margo Aye-- Basal cell   PROSTATE SURGERY     REMOVAL OF STONES  02/22/2022   Procedure: REMOVAL OF STONES;  Surgeon: Jeani Hawking, MD;  Location: [facility] ENDOSCOPY;  Service: Gastroenterology;;   Dennison Mascot  02/22/2022   Procedure: SPHINCTEROTOMY;

## 2023-04-16 LAB — CARBAMAZEPINE LEVEL, TOTAL

## 2023-04-18 ENCOUNTER — Encounter (HOSPITAL_COMMUNITY): Payer: Self-pay

## 2023-04-18 ENCOUNTER — Observation Stay (HOSPITAL_COMMUNITY)
Admission: EM | Admit: 2023-04-18 | Discharge: 2023-04-19 | Disposition: A | Discharge status: Home / Self Care | Payer: PPO | Attending: Internal Medicine | Admitting: Internal Medicine

## 2023-04-18 ENCOUNTER — Observation Stay (HOSPITAL_COMMUNITY): Payer: PPO

## 2023-04-18 ENCOUNTER — Other Ambulatory Visit (HOSPITAL_BASED_OUTPATIENT_CLINIC_OR_DEPARTMENT_OTHER): Payer: Self-pay

## 2023-04-18 ENCOUNTER — Other Ambulatory Visit: Payer: Self-pay

## 2023-04-18 ENCOUNTER — Emergency Department (HOSPITAL_COMMUNITY): Payer: PPO

## 2023-04-18 DIAGNOSIS — D631 Anemia in chronic kidney disease: Secondary | ICD-10-CM | POA: Insufficient documentation

## 2023-04-18 DIAGNOSIS — N390 Urinary tract infection, site not specified: Secondary | ICD-10-CM | POA: Insufficient documentation

## 2023-04-18 DIAGNOSIS — D638 Anemia in other chronic diseases classified elsewhere: Secondary | ICD-10-CM | POA: Diagnosis present

## 2023-04-18 DIAGNOSIS — Z8673 Personal history of transient ischemic attack (TIA), and cerebral infarction without residual deficits: Secondary | ICD-10-CM | POA: Insufficient documentation

## 2023-04-18 DIAGNOSIS — G20C Parkinsonism, unspecified: Secondary | ICD-10-CM | POA: Insufficient documentation

## 2023-04-18 DIAGNOSIS — Z7902 Long term (current) use of antithrombotics/antiplatelets: Secondary | ICD-10-CM | POA: Diagnosis not present

## 2023-04-18 DIAGNOSIS — G20A1 Parkinson's disease without dyskinesia, without mention of fluctuations: Secondary | ICD-10-CM | POA: Diagnosis not present

## 2023-04-18 DIAGNOSIS — G9389 Other specified disorders of brain: Secondary | ICD-10-CM | POA: Diagnosis not present

## 2023-04-18 DIAGNOSIS — Z91040 Latex allergy status: Secondary | ICD-10-CM | POA: Diagnosis not present

## 2023-04-18 DIAGNOSIS — I10 Essential (primary) hypertension: Secondary | ICD-10-CM | POA: Diagnosis not present

## 2023-04-18 DIAGNOSIS — I129 Hypertensive chronic kidney disease with stage 1 through stage 4 chronic kidney disease, or unspecified chronic kidney disease: Secondary | ICD-10-CM | POA: Insufficient documentation

## 2023-04-18 DIAGNOSIS — I6930 Unspecified sequelae of cerebral infarction: Secondary | ICD-10-CM

## 2023-04-18 DIAGNOSIS — Z87891 Personal history of nicotine dependence: Secondary | ICD-10-CM | POA: Insufficient documentation

## 2023-04-18 DIAGNOSIS — G459 Transient cerebral ischemic attack, unspecified: Principal | ICD-10-CM

## 2023-04-18 DIAGNOSIS — R41841 Cognitive communication deficit: Secondary | ICD-10-CM | POA: Diagnosis not present

## 2023-04-18 DIAGNOSIS — G9341 Metabolic encephalopathy: Principal | ICD-10-CM | POA: Insufficient documentation

## 2023-04-18 DIAGNOSIS — Z79899 Other long term (current) drug therapy: Secondary | ICD-10-CM | POA: Insufficient documentation

## 2023-04-18 DIAGNOSIS — I2510 Atherosclerotic heart disease of native coronary artery without angina pectoris: Secondary | ICD-10-CM | POA: Insufficient documentation

## 2023-04-18 DIAGNOSIS — F03918 Unspecified dementia, unspecified severity, with other behavioral disturbance: Secondary | ICD-10-CM | POA: Diagnosis present

## 2023-04-18 DIAGNOSIS — R4182 Altered mental status, unspecified: Secondary | ICD-10-CM | POA: Diagnosis not present

## 2023-04-18 DIAGNOSIS — N1832 Chronic kidney disease, stage 3b: Secondary | ICD-10-CM | POA: Diagnosis present

## 2023-04-18 DIAGNOSIS — E785 Hyperlipidemia, unspecified: Secondary | ICD-10-CM | POA: Diagnosis not present

## 2023-04-18 DIAGNOSIS — R791 Abnormal coagulation profile: Secondary | ICD-10-CM | POA: Insufficient documentation

## 2023-04-18 DIAGNOSIS — N400 Enlarged prostate without lower urinary tract symptoms: Secondary | ICD-10-CM | POA: Diagnosis present

## 2023-04-18 DIAGNOSIS — Z85828 Personal history of other malignant neoplasm of skin: Secondary | ICD-10-CM | POA: Diagnosis not present

## 2023-04-18 DIAGNOSIS — R531 Weakness: Principal | ICD-10-CM | POA: Insufficient documentation

## 2023-04-18 DIAGNOSIS — Z8551 Personal history of malignant neoplasm of bladder: Secondary | ICD-10-CM | POA: Insufficient documentation

## 2023-04-18 DIAGNOSIS — I6381 Other cerebral infarction due to occlusion or stenosis of small artery: Secondary | ICD-10-CM | POA: Diagnosis not present

## 2023-04-18 DIAGNOSIS — R61 Generalized hyperhidrosis: Secondary | ICD-10-CM | POA: Diagnosis not present

## 2023-04-18 DIAGNOSIS — R4701 Aphasia: Secondary | ICD-10-CM | POA: Insufficient documentation

## 2023-04-18 DIAGNOSIS — N401 Enlarged prostate with lower urinary tract symptoms: Secondary | ICD-10-CM

## 2023-04-18 DIAGNOSIS — G9340 Encephalopathy, unspecified: Principal | ICD-10-CM | POA: Diagnosis present

## 2023-04-18 DIAGNOSIS — R4781 Slurred speech: Secondary | ICD-10-CM | POA: Diagnosis not present

## 2023-04-18 LAB — LIPID PANEL
Cholesterol: 178 mg/dL (ref 0–200)
HDL: 51 mg/dL (ref >40)
LDL Cholesterol: 112 mg/dL — ABNORMAL HIGH (ref 0–99)
Total CHOL/HDL Ratio: 3.5 {ratio}
Triglycerides: 76 mg/dL (ref <150)
VLDL: 15 mg/dL (ref 0–40)

## 2023-04-18 LAB — DIFFERENTIAL
Abs Immature Granulocytes: 0.03 10*3/uL (ref 0.00–0.07)
Basophils Absolute: 0 10*3/uL (ref 0.0–0.1)
Basophils Relative: 1 %
Eosinophils Absolute: 0.1 10*3/uL (ref 0.0–0.5)
Eosinophils Relative: 2 %
Immature Granulocytes: 0 %
Lymphocytes Relative: 16 %
Lymphs Abs: 1.2 10*3/uL (ref 0.7–4.0)
Monocytes Absolute: 0.5 10*3/uL (ref 0.1–1.0)
Monocytes Relative: 7 %
Neutro Abs: 5.3 10*3/uL (ref 1.7–7.7)
Neutrophils Relative %: 74 %

## 2023-04-18 LAB — COMPREHENSIVE METABOLIC PANEL
ALT: 5 U/L (ref 0–44)
AST: 15 U/L (ref 15–41)
Albumin: 3.8 g/dL (ref 3.5–5.0)
Alkaline Phosphatase: 140 U/L — ABNORMAL HIGH (ref 38–126)
Anion gap: 12 (ref 5–15)
BUN: 23 mg/dL (ref 8–23)
CO2: 24 mmol/L (ref 22–32)
Calcium: 9.2 mg/dL (ref 8.9–10.3)
Chloride: 103 mmol/L (ref 98–111)
Creatinine, Ser: 1.82 mg/dL — ABNORMAL HIGH (ref 0.61–1.24)
GFR, Estimated: 37 mL/min — ABNORMAL LOW (ref >60)
Glucose, Bld: 136 mg/dL — ABNORMAL HIGH (ref 70–99)
Potassium: 4.2 mmol/L (ref 3.5–5.1)
Sodium: 139 mmol/L (ref 135–145)
Total Bilirubin: 0.6 mg/dL (ref <1.2)
Total Protein: 6.9 g/dL (ref 6.5–8.1)

## 2023-04-18 LAB — URINALYSIS, ROUTINE W REFLEX MICROSCOPIC
Bilirubin Urine: NEGATIVE
Glucose, UA: NEGATIVE mg/dL
Ketones, ur: NEGATIVE mg/dL
Nitrite: POSITIVE — AB
Protein, ur: 100 mg/dL — AB
Specific Gravity, Urine: 1.015 (ref 1.005–1.030)
pH: 7 (ref 5.0–8.0)

## 2023-04-18 LAB — RAPID URINE DRUG SCREEN, HOSP PERFORMED
Amphetamines: NOT DETECTED
Barbiturates: NOT DETECTED
Benzodiazepines: POSITIVE — AB
Cocaine: NOT DETECTED
Opiates: NOT DETECTED
Tetrahydrocannabinol: NOT DETECTED

## 2023-04-18 LAB — CARBAMAZEPINE LEVEL, TOTAL: Carbamazepine Lvl: 6.8 ug/mL (ref 4.0–12.0)

## 2023-04-18 LAB — I-STAT CHEM 8, ED
BUN: 26 mg/dL — ABNORMAL HIGH (ref 8–23)
Calcium, Ion: 1.03 mmol/L — ABNORMAL LOW (ref 1.15–1.40)
Chloride: 105 mmol/L (ref 98–111)
Creatinine, Ser: 1.9 mg/dL — ABNORMAL HIGH (ref 0.61–1.24)
Glucose, Bld: 140 mg/dL — ABNORMAL HIGH (ref 70–99)
HCT: 39 % (ref 39.0–52.0)
Hemoglobin: 13.3 g/dL (ref 13.0–17.0)
Potassium: 4.2 mmol/L (ref 3.5–5.1)
Sodium: 140 mmol/L (ref 135–145)
TCO2: 23 mmol/L (ref 22–32)

## 2023-04-18 LAB — PROTIME-INR
INR: 1 (ref 0.8–1.2)
Prothrombin Time: 13.6 s (ref 11.4–15.2)

## 2023-04-18 LAB — CBC
HCT: 38.1 % — ABNORMAL LOW (ref 39.0–52.0)
Hemoglobin: 12.6 g/dL — ABNORMAL LOW (ref 13.0–17.0)
MCH: 32.7 pg (ref 26.0–34.0)
MCHC: 33.1 g/dL (ref 30.0–36.0)
MCV: 99 fL (ref 80.0–100.0)
Platelets: 178 10*3/uL (ref 150–400)
RBC: 3.85 MIL/uL — ABNORMAL LOW (ref 4.22–5.81)
RDW: 13.3 % (ref 11.5–15.5)
WBC: 7.1 10*3/uL (ref 4.0–10.5)
nRBC: 0 % (ref 0.0–0.2)

## 2023-04-18 LAB — CBG MONITORING, ED: Glucose-Capillary: 135 mg/dL — ABNORMAL HIGH (ref 70–99)

## 2023-04-18 LAB — APTT: aPTT: 23 s — ABNORMAL LOW (ref 24–36)

## 2023-04-18 LAB — HEMOGLOBIN A1C
Hgb A1c MFr Bld: 4.9 % (ref 4.8–5.6)
Mean Plasma Glucose: 93.93 mg/dL

## 2023-04-18 LAB — ETHANOL: Alcohol, Ethyl (B): <10 mg/dL (ref <10)

## 2023-04-18 MED ORDER — ACETAMINOPHEN 650 MG RE SUPP: 650.0000 mg | RECTAL | Status: DC | PRN

## 2023-04-18 MED ORDER — SODIUM CHLORIDE 0.9 % IV SOLN: INTRAVENOUS | Status: AC

## 2023-04-18 MED ORDER — ACETAMINOPHEN 160 MG/5ML PO SOLN: 650.0000 mg | ORAL | Status: DC | PRN

## 2023-04-18 MED ORDER — CARBAMAZEPINE 100 MG/5ML PO SUSP
100.0000 mg | Freq: Two times a day (BID) | ORAL | Status: DC
  Administered 2023-04-19 (×2): 100 mg via ORAL
  Filled 2023-04-18 (×3): qty 5

## 2023-04-18 MED ORDER — TRAZODONE HCL 50 MG PO TABS
75.0000 mg | ORAL_TABLET | Freq: Every day | ORAL | Status: DC
  Administered 2023-04-18: 75 mg via ORAL
  Filled 2023-04-18: qty 2

## 2023-04-18 MED ORDER — STROKE: EARLY STAGES OF RECOVERY BOOK
Freq: Once | Status: AC
Start: 2023-04-19 — End: 2023-04-19
  Filled 2023-04-18: qty 1

## 2023-04-18 MED ORDER — FLUTICASONE PROPIONATE 0.05 % EX CREA: 1.0000 | TOPICAL_CREAM | Freq: Every evening | CUTANEOUS | Status: DC | PRN

## 2023-04-18 MED ORDER — SENNOSIDES-DOCUSATE SODIUM 8.6-50 MG PO TABS
1.0000 | ORAL_TABLET | Freq: Every evening | ORAL | Status: DC | PRN
Start: 2023-04-18 — End: 2023-04-19

## 2023-04-18 MED ORDER — CARBAMAZEPINE 100 MG/5ML PO SUSP
100.0000 mg | ORAL | Status: AC
  Administered 2023-04-18: 100 mg via ORAL
  Filled 2023-04-18: qty 5

## 2023-04-18 MED ORDER — POLYETHYLENE GLYCOL 3350 17 G PO PACK
17.0000 g | PACK | ORAL | Status: DC
  Administered 2023-04-19: 17 g via ORAL
  Filled 2023-04-18: qty 1

## 2023-04-18 MED ORDER — LORAZEPAM 0.5 MG PO TABS
0.2500 mg | ORAL_TABLET | Freq: Every day | ORAL | Status: DC
  Administered 2023-04-19: 0.25 mg via ORAL
  Filled 2023-04-18: qty 1

## 2023-04-18 MED ORDER — CARBAMAZEPINE 100 MG/5ML PO SUSP
200.0000 mg | Freq: Every day | ORAL | Status: DC
  Administered 2023-04-18: 200 mg via ORAL
  Filled 2023-04-18 (×3): qty 10

## 2023-04-18 MED ORDER — CARBIDOPA-LEVODOPA 25-100 MG PO TABS
1.5000 | ORAL_TABLET | Freq: Three times a day (TID) | ORAL | Status: DC
  Administered 2023-04-18 – 2023-04-19 (×4): 1.5 via ORAL
  Filled 2023-04-18 (×5): qty 2

## 2023-04-18 MED ORDER — HEPARIN SODIUM (PORCINE) 5000 UNIT/ML IJ SOLN
5000.0000 [IU] | Freq: Three times a day (TID) | INTRAMUSCULAR | Status: DC
  Administered 2023-04-18 – 2023-04-19 (×3): 5000 [IU] via SUBCUTANEOUS
  Filled 2023-04-18 (×3): qty 1

## 2023-04-18 MED ORDER — TAMSULOSIN HCL 0.4 MG PO CAPS
0.4000 mg | ORAL_CAPSULE | Freq: Every day | ORAL | Status: DC
  Administered 2023-04-18: 0.4 mg via ORAL
  Filled 2023-04-18: qty 1

## 2023-04-18 MED ORDER — PANTOPRAZOLE SODIUM 40 MG PO TBEC
40.0000 mg | DELAYED_RELEASE_TABLET | Freq: Every day | ORAL | Status: DC
  Administered 2023-04-19: 40 mg via ORAL
  Filled 2023-04-18: qty 1

## 2023-04-18 MED ORDER — ACETAMINOPHEN 325 MG PO TABS: 650.0000 mg | ORAL_TABLET | ORAL | Status: DC | PRN

## 2023-04-18 MED ORDER — LORAZEPAM 0.5 MG PO TABS
0.5000 mg | ORAL_TABLET | ORAL | Status: DC
  Administered 2023-04-19: 0.5 mg via ORAL
  Filled 2023-04-18: qty 1

## 2023-04-18 MED ORDER — LORAZEPAM 1 MG PO TABS
1.0000 mg | ORAL_TABLET | Freq: Every day | ORAL | Status: DC
  Administered 2023-04-18: 1 mg via ORAL
  Filled 2023-04-18: qty 1

## 2023-04-18 MED ORDER — LORAZEPAM 0.5 MG PO TABS
0.5000 mg | ORAL_TABLET | ORAL | Status: AC
  Administered 2023-04-18: 0.5 mg via ORAL
  Filled 2023-04-18: qty 1

## 2023-04-18 MED ORDER — CLOBETASOL PROPIONATE 0.05 % EX CREA: 1.0000 | TOPICAL_CREAM | Freq: Two times a day (BID) | CUTANEOUS | Status: DC | PRN

## 2023-04-18 MED ORDER — CLOPIDOGREL BISULFATE 75 MG PO TABS
75.0000 mg | ORAL_TABLET | Freq: Every day | ORAL | Status: DC
  Administered 2023-04-19: 75 mg via ORAL
  Filled 2023-04-18: qty 1

## 2023-04-18 MED ORDER — AMANTADINE HCL 100 MG PO CAPS
100.0000 mg | ORAL_CAPSULE | Freq: Every day | ORAL | Status: DC
  Administered 2023-04-19: 100 mg via ORAL
  Filled 2023-04-18: qty 1

## 2023-04-18 NOTE — ED Provider Notes (Signed)
[name] EMERGENCY DEPARTMENT AT [facility] Provider Note   CSN: [id number] Arrival date & time: 04/18/23  1339  An emergency department physician performed an initial assessment on this suspected stroke patient at 1340.  History {Add pertinent medical, surgical, social history, OB history to HPI:1} Chief Complaint  Patient presents with   Code Stroke    William [name] is a 82 y.o. male.  HPI 82 yo male from facility, ho parkinson's and dementia, was eating lunch, episode of decreased responsiveness with right side weakness, lkw 1230. CBG  No evidence of seizure Daycare staff reported that he had some listing to the right and increased right-sided weakness with some slurring of his speech.  On arrival to hospital patient with stroke score of 2 per neurology.  He appears to be back to baseline.      Home Medications Prior to Admission medications   Medication Sig Start Date End Date Taking? Authorizing Provider  acetaminophen (TYLENOL) 325 MG tablet Take 2 tablets (650 mg total) by mouth every 6 (six) hours as needed for mild pain. 02/24/22  Yes Eric Form, PA-C  amantadine (SYMMETREL) 100 MG capsule Take 1 capsule (100 mg total) by mouth daily. 03/21/23  Yes Seabron Spates R, DO  amLODipine (NORVASC) 10 MG tablet Take 1 tablet (10 mg total) by mouth every morning. 01/03/23  Yes [name], [name], DO  carBAMazepine (TEGRETOL) 100 MG/5ML suspension Take 5 mLs (100 mg total) by mouth in the morning and in the afternoon, and then 10 mLs (200 mg total) at bedtime. 03/29/23  Yes Plovsky, Earvin Hansen, MD  carbidopa-levodopa (SINEMET IR) 25-100 MG tablet Take 1.5 tablets by mouth 3 (three) times daily. 03/04/23  Yes Jaffe, Adam R, DO  Cholecalciferol (VITAMIN D3) 50 MCG (2000 UT) CHEW Chew 1 tablet by mouth daily.   Yes [provider]  clobetasol cream (TEMOVATE) 0.05 % Apply 1 Application to affected areas topically up to 2 (two) times daily as needed. Do not  apply to face, groin, or under arms 11/02/22  Yes Nita Sells, MD  clopidogrel (PLAVIX) 75 MG tablet Take 1 tablet (75 mg total) by mouth daily. 03/14/23  Yes Seabron Spates R, DO  ferrous sulfate 325 (65 FE) MG EC tablet Take 325 mg by mouth 2 (two) times daily. *Crushed*   Yes [provider]  fluticasone (CUTIVATE) 0.05 % cream 1 application  at bedtime as needed (psoriasis). 12/16/16  Yes [provider]  hydrALAZINE (APRESOLINE) 25 MG tablet Take 1 tablet (25 mg total) by mouth every 8 (eight) hours. Patient taking differently: Take 25 mg by mouth 3 (three) times daily. 01/03/23 07/05/23 Yes Donato Schultz, DO  LORazepam (ATIVAN) 1 MG tablet Take 1/4 tablet by mouth in the morning, 1/2 tablet by mouth at lunch and 1 tablet by mouth at bedtime 04/04/23  Yes Plovsky, Earvin Hansen, MD  Multiple Vitamins-Minerals (ADULT ONE DAILY GUMMIES) CHEW Chew 1 tablet by mouth every morning.   Yes [provider]  pantoprazole (PROTONIX) 40 MG tablet Take 1 tablet (40 mg total) by mouth daily. 11/15/22  Yes Seabron Spates R, DO  polyethylene glycol (MIRALAX / GLYCOLAX) 17 g packet Take 17 g by mouth daily. Patient taking differently: Take 17 g by mouth every other day. 10/02/21  Yes Pricilla Loveless, MD  tamsulosin (FLOMAX) 0.4 MG CAPS capsule Take 1 capsule (0.4 mg total) by mouth daily after supper. Patient taking differently: Take 0.4 mg by mouth at  bedtime. 04/11/23  Yes Seabron Spates R, DO  traZODone (DESYREL) 150 MG tablet Take 0.5 tablets (75 mg total) by mouth at bedtime. *Crushed* 03/29/23  Yes Plovsky, Earvin Hansen, MD  triamcinolone cream (KENALOG) 0.1 % Apply 1 application topically 2 (two) times daily. 11/15/22  Yes Donato Schultz, DO      Allergies    Bee venom, Strawberry extract, Latex, Zetia [ezetimibe], Adhesive [tape], and Statins    Review of Systems   Review of Systems  Physical Exam Updated Vital Signs BP (!) 151/66   Pulse 63   Temp 97.8 F  (36.6 C) (Oral)   Resp (!) 21   Ht 1.753 m (5\' 9" )   Wt 75.6 kg   SpO2 100%   BMI 24.61 kg/m  Physical Exam Vitals and nursing note reviewed.  HENT:     Head: Normocephalic.     Right Ear: External ear normal.     Left Ear: External ear normal.     Nose: Nose normal.     Mouth/Throat:     Pharynx: Oropharynx is clear.  Eyes:     Pupils: Pupils are equal, round, and reactive to light.  Cardiovascular:     Rate and Rhythm: Normal rate and regular rhythm.     Pulses: Normal pulses.  Pulmonary:     Effort: Pulmonary effort is normal.     Breath sounds: Normal breath sounds.  Abdominal:     General: Abdomen is flat.     Palpations: Abdomen is soft.  Musculoskeletal:     Cervical back: Normal range of motion.  Neurological:     Mental Status: He is alert.     ED Results / Procedures / Treatments   Labs (all labs ordered are listed, but only abnormal results are displayed) Labs Reviewed  APTT - Abnormal; Notable for the following components:      Result Value   aPTT 23 (*)    All other components within normal limits  CBC - Abnormal; Notable for the following components:   RBC 3.85 (*)    Hemoglobin 12.6 (*)    HCT 38.1 (*)    All other components within normal limits  COMPREHENSIVE METABOLIC PANEL - Abnormal; Notable for the following components:   Glucose, Bld 136 (*)    Creatinine, Ser 1.82 (*)    Alkaline Phosphatase 140 (*)    GFR, Estimated 37 (*)    All other components within normal limits  I-STAT CHEM 8, ED - Abnormal; Notable for the following components:   BUN 26 (*)    Creatinine, Ser 1.90 (*)    Glucose, Bld 140 (*)    Calcium, Ion 1.03 (*)    All other components within normal limits  CBG MONITORING, ED - Abnormal; Notable for the following components:   Glucose-Capillary 135 (*)    All other components within normal limits  ETHANOL  PROTIME-INR  DIFFERENTIAL  RAPID URINE DRUG SCREEN, HOSP PERFORMED  URINALYSIS, ROUTINE W REFLEX MICROSCOPIC   LIPID PANEL  HEMOGLOBIN A1C    EKG None  Radiology CT HEAD CODE STROKE WO CONTRAST  Result Date: 04/18/2023 CLINICAL DATA:  Code stroke.  Slurred speech EXAM: CT HEAD WITHOUT CONTRAST TECHNIQUE: Contiguous axial images were obtained from the base of the skull through the vertex without intravenous contrast. RADIATION DOSE REDUCTION: This exam was performed according to the departmental dose-optimization program which includes automated exposure control, adjustment of the mA and/or kV according to patient size and/or use of iterative  reconstruction technique. COMPARISON:  02/26/2022 FINDINGS: Brain: No evidence of acute infarction, hemorrhage, mass, mass effect, or midline shift. No hydrocephalus or extra-axial collection. Redemonstrated extensive encephalomalacia in the right frontal and parietal lobes. Remote left basal ganglia infarct. Ex vacuo dilatation of the lateral ventricles. Periventricular white matter changes, likely the sequela of chronic small vessel ischemic disease. Vascular: No hyperdense vessel. Skull: Negative for fracture or focal lesion. Sinuses/Orbits: No acute finding. Other: The mastoid air cells are well aerated. ASPECTS [facility] Stroke Program Early CT Score) - Ganglionic level infarction (caudate, lentiform nuclei, internal capsule, insula, M1-M3 cortex): 7 - Supraganglionic infarction (M4-M6 cortex): 3 Total score (0-10 with 10 being normal): 10 IMPRESSION: No acute intracranial process. ASPECTS is 10. Imaging results were communicated on 04/18/2023 at 1:58 pm to provider Dr. Selina Cooley via secure text paging. Electronically Signed   By: Wiliam Ke M.D.   On: 04/18/2023 13:58    Procedures Procedures  {Document cardiac monitor, telemetry assessment procedure when appropriate:1}  Medications Ordered in ED Medications   stroke: early stages of recovery book (has no administration in time range)  0.9 %  sodium chloride infusion (has no administration in time range)   acetaminophen (TYLENOL) tablet 650 mg (has no administration in time range)    Or  acetaminophen (TYLENOL) 160 MG/5ML solution 650 mg (has no administration in time range)    Or  acetaminophen (TYLENOL) suppository 650 mg (has no administration in time range)  senna-docusate (Senokot-S) tablet 1 tablet (has no administration in time range)  heparin injection 5,000 Units (has no administration in time range)    ED Course/ Medical Decision Making/ A&P   {   Click here for ABCD2, HEART and other calculatorsREFRESH Note before signing :1}                              Medical Decision Making Amount and/or Complexity of Data Reviewed Labs: ordered. Radiology: ordered.   Neuro saw and evaluated and patient now with stroke score of 2 with chronic facial droop and dementia Plan admission for TIA evaluation  {Document critical care time when appropriate:1} {Document review of labs and clinical decision tools ie heart score, Chads2Vasc2 etc:1}  {Document your independent review of radiology images, and any outside records:1} {Document your discussion with family members, caretakers, and with consultants:1} {Document social determinants of health affecting pt's care:1} {Document your decision making why or why not admission, treatments were needed:1} Final Clinical Impression(s) / ED Diagnoses Final diagnoses:  None    Rx / DC Orders ED Discharge Orders     None

## 2023-04-18 NOTE — Consult Note (Signed)
NEUROLOGY CONSULT NOTE   Date of service: April 18, 2023 Patient Name: William [name] MRN:  [id number] DOB:  1940-08-12 Chief Complaint: "Episode of dizziness, wooziness and diaphoresis" Requesting Provider: Margarita Grizzle, MD  History of Present Illness  William [name] is a 82 y.o. male with a history of Parkinson's disease, vascular dementia, 2 strokes, CKD, hypertension, CAD and hyperlipidemia who presents after having an episode today where he was eating lunch and then suddenly felt dizzy, woozy and diaphoretic.  Staff at [facility] state that patient exhibited slurring of speech and right-sided weakness (leaning to the right per patient's wife), although patient denies this.  On exam, patient has left-sided facial droop which is chronic from his previous stroke as well as confusion about the month.  He is able to give a clear and coherent history of events that happened today.  Patient's wife states that when she spoke with him after this event, his speech did sound somewhat slurred but that it has returned to baseline.  Patient's wife also states that he has had episodes like this in the past, sometimes in the context of a UTI.  LKW: 1234 Modified rankin score: 3-Moderate disability-requires help but walks WITHOUT assistance IV Thrombolysis:  No, no focal deficits on exam EVT:  No, exam not consistent with LVO   ROS  Comprehensive ROS performed and pertinent positives documented in HPI  Past History   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   HTN (hypertension) 07/19/2015   Hyperlipidemia    Major  neurocognitive disorder 01/09/2014   Mild, related to stroke history   Nocturia    Renal insufficiency 06/25/2013   S/P carotid endarterectomy    BILATERAL ICA--  PATENT PER DUPLEX  05-19-2012   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   Right calf   Urinary frequency    Vitamin D deficiency     Past Surgical History:  Procedure Laterality Date   APPENDECTOMY  AS CHILD   CARDIOVASCULAR STRESS TEST  03-27-2012  DR CRENSHAW   LOW RISK LEXISCAN STUDY-- PROBABLE NORMAL PERFUSION AND SOFT TISSUE ATTENUATION/  NO ISCHEMIA/ EF 51%   CAROTID ENDARTERECTOMY Bilateral LEFT  11-12-2008  DR GREG HAYES   RIGHT ICA  2006  ([facility])   CHOLECYSTECTOMY N/A 02/23/2022   Procedure: LAPAROSCOPIC CHOLECYSTECTOMY;  Surgeon: Quentin Ore, MD;  Location: [facility] OR;  Service: General;  Laterality: N/A;   CYSTOSCOPY W/ RETROGRADES Bilateral 06/22/2021   Procedure: CYSTOSCOPY WITH RETROGRADE PYELOGRAM;  Surgeon: Marcine Matar, MD;  Location: [facility] [place];  Service: Urology;  Laterality: Bilateral;   CYSTOSCOPY WITH LITHOLAPAXY N/A 02/26/2013   Procedure: CYSTOSCOPY WITH LITHOLAPAXY;  Surgeon: Marcine Matar, MD;  Location: [facility];  Service: Urology;  Laterality: N/A;   ENDOSCOPIC RETROGRADE CHOLANGIOPANCREATOGRAPHY (ERCP) WITH PROPOFOL N/A 02/22/2022   Procedure: ENDOSCOPIC RETROGRADE CHOLANGIOPANCREATOGRAPHY (ERCP) WITH PROPOFOL;  Surgeon: Jeani Hawking, MD;  Location: [facility] ENDOSCOPY;  Service: Gastroenterology;  Laterality: N/A;   EYE SURGERY  Jan. 2016   cataract  surgery both eyes   INGUINAL HERNIA REPAIR Right 11-08-2006   IR KYPHO EA ADDL LEVEL THORACIC OR LUMBAR  02/12/2021   IR RADIOLOGIST EVAL & MGMT  02/18/2021   MASS EXCISION N/A 03/03/2016   Procedure: EXCISION OF BACK  MASS;  Surgeon: Almond Lint, MD;  Location: [facility];  Service: General;  Laterality: N/A;   MOHS SURGERY Left 1/ 2016   Dr Margo Aye-- Basal cell    PROSTATE SURGERY     REMOVAL OF STONES  02/22/2022   Procedure: REMOVAL OF STONES;  Surgeon: Jeani Hawking, MD;  Location: [facility] ENDOSCOPY;  Service: Gastroenterology;;   Dennison Mascot  02/22/2022   Procedure: Dennison Mascot;  Surgeon: Jeani Hawking, MD;  Location: [facility] ENDOSCOPY;  Service: Gastroenterology;;   TRANSURETHRAL RESECTION OF BLADDER TUMOR WITH MITOMYCIN-C N/A 06/22/2021   Procedure: TRANSURETHRAL RESECTION OF BLADDER TUMOR;  Surgeon: Marcine Matar, MD;  Location: [facility];  Service: Urology;  Laterality: N/A;   TRANSURETHRAL RESECTION OF PROSTATE N/A 02/26/2013   Procedure: TRANSURETHRAL RESECTION OF THE PROSTATE WITH GYRUS INSTRUMENTS;  Surgeon: Marcine Matar, MD;  Location: [facility];  Service: Urology;  Laterality: N/A;   TRANSURETHRAL RESECTION OF PROSTATE N/A 06/22/2021   Procedure: TRANSURETHRAL RESECTION OF THE PROSTATE (TURP);  Surgeon: Marcine Matar, MD;  Location: [facility];  Service: Urology;  Laterality: N/A;    Family History: Family History  Problem Relation Age of Onset   Heart disease Mother        CHF   Bipolar disorder Mother    Heart disease Father        CHF    Social History  reports that he quit smoking about 18 years ago. His smoking use included cigarettes. He started smoking about 58 years ago. He has a 80 pack-year smoking history. He has never used smokeless tobacco. He reports that he does not currently use alcohol. He reports that he does not use drugs.  Allergies  Allergen Reactions   Bee Venom Anaphylaxis   Strawberry Extract Hives   Latex Itching   Zetia [Ezetimibe] Other (See Comments)    Intolerance    Adhesive [Tape] Other (See Comments)    blisters   Statins Other (See Comments)    myalgias    Medications  No current facility-administered medications for this encounter.  Current Outpatient Medications:    acetaminophen (TYLENOL) 325 MG tablet, Take 2 tablets (650 mg  total) by mouth every 6 (six) hours as needed for mild pain., Disp: , Rfl:    amantadine (SYMMETREL) 100 MG capsule, Take 1 capsule (100 mg total) by mouth daily., Disp: 90 capsule, Rfl: 1   amLODipine (NORVASC) 10 MG tablet, Take 1 tablet (10 mg total) by mouth every morning., Disp: 90 tablet, Rfl: 1   carBAMazepine (TEGRETOL) 100 MG/5ML suspension, Take 5 mLs (100 mg total) by mouth in the morning and in the afternoon, and then 10 mLs (200 mg total) at bedtime., Disp: 600 mL, Rfl: 3   carbidopa-levodopa (SINEMET IR) 25-100 MG tablet, Take 1.5 tablets by mouth 3 (three) times daily., Disp: 165 tablet, Rfl: 5   Cholecalciferol (VITAMIN D3) 25 MCG (1000 UT) CHEW, Chew 1 tablet by mouth daily., Disp: , Rfl:    clobetasol cream (TEMOVATE) 0.05 %, Apply 1 Application to affected areas topically up to 2 (two) times daily as needed. Do not apply to face, groin, or under arms, Disp: 60 g, Rfl: 3   clobetasol cream (TEMOVATE) 0.05 %, Apply 1  application topically to affected area(s) up to 2 (two) times daily as needed. Do not apply to face, groin, or underarms., Disp: 60 g, Rfl: 3   clopidogrel (PLAVIX) 75 MG tablet, Take 1 tablet (75 mg total) by mouth daily., Disp: 90 tablet, Rfl: 1   clotrimazole-betamethasone (LOTRISONE) cream, Apply 1 Application on to the skin daily., Disp: 30 g, Rfl: 0   ferrous sulfate 325 (65 FE) MG EC tablet, Take 325 mg by mouth 2 (two) times daily. *Crushed*, Disp: , Rfl:    fluticasone (CUTIVATE) 0.05 % cream, 1 application  daily as needed (psoriasis)., Disp: , Rfl: 3   fluticasone (FLONASE) 50 MCG/ACT nasal spray, Place 1 spray into both nostrils daily., Disp: 15.8 mL, Rfl: 0   hydrALAZINE (APRESOLINE) 25 MG tablet, Take 1 tablet (25 mg total) by mouth every 8 (eight) hours., Disp: 270 tablet, Rfl: 1   LORazepam (ATIVAN) 1 MG tablet, Take 1/4 tablet by mouth in the morning, 1/2 tablet by mouth at lunch and 1 tablet by mouth at bedtime, Disp: 60 tablet, Rfl: 2   Multiple  Vitamins-Minerals (ADULT ONE DAILY GUMMIES) CHEW, Chew 1 tablet by mouth every morning., Disp: , Rfl:    ofloxacin (FLOXIN) 0.3 % OTIC solution, Place 10 drops into both ears daily., Disp: 5 mL, Rfl: 0   pantoprazole (PROTONIX) 40 MG tablet, Take 1 tablet (40 mg total) by mouth daily., Disp: 90 tablet, Rfl: 3   polyethylene glycol (MIRALAX / GLYCOLAX) 17 g packet, Take 17 g by mouth daily., Disp: 14 each, Rfl: 0   tamsulosin (FLOMAX) 0.4 MG CAPS capsule, Take 1 capsule (0.4 mg total) by mouth daily after supper., Disp: 90 capsule, Rfl: 1   tiZANidine (ZANAFLEX) 4 MG tablet, Take 1 tablet (4 mg total) by mouth every 6 (six) hours as needed for muscle spasms., Disp: 30 tablet, Rfl: 0   traZODone (DESYREL) 150 MG tablet, Take 0.5 tablets (75 mg total) by mouth at bedtime. *Crushed*, Disp: 20 tablet, Rfl: 6   triamcinolone cream (KENALOG) 0.1 %, Apply 1 application topically 2 (two) times daily., Disp: 30 g, Rfl: 0  Facility-Administered Medications Ordered in Other Encounters:    gemcitabine (GEMZAR) chemo syringe for bladder instillation 2,000 mg, 2,000 mg, Bladder Instillation, Once, Marcine Matar, MD  Vitals   Vitals:   04/18/23 1358 04/18/23 1406  BP: (!) 168/97   Pulse: 62   Resp: 20   SpO2: 100%   Weight:  75.6 kg  Height:  5\' 9"  (1.753 m)    Body mass index is 24.61 kg/m.  Physical Exam   Constitutional: Well-developed, well-nourished elderly patient in no acute distress Psych: Affect appropriate to situation.  Eyes: No scleral injection.  HENT: No OP obstruction.  Head: Normocephalic.  Cardiovascular: Normal rate and regular rhythm.  Respiratory: Effort normal, non-labored breathing.  Skin: WDI.   Neurologic Examination    NEURO:  Mental Status: Alert and oriented to person, place and situation but confused about the month Speech/Language: speech is without dysarthria or aphasia.  Naming, fluency, and comprehension intact.  Cranial Nerves:  II: PERRL. Visual  fields full.  III, IV, VI: EOMI. Eyelids elevate symmetrically.  VII: Left-sided facial droop VIII: hearing intact to voice. IX, X: Voice is hypophonic ZO:[id number] shrug 5/5. XII: tongue is midline without fasciculations. Motor: 5/5 strength to all muscle groups tested.  Tone: is normal and bulk is normal Sensation- Intact to light touch bilaterally. Sharp/Dull   Vibration.   Coordination: FTN intact bilaterally.No drift.  Gait- deferred  NIHSS:  1a Level of Conscious.: 0 1b LOC Questions: 1 1c LOC Commands: 0 2 Best Gaze: 0 3 Visual: 0 4 Facial Palsy: 1 5a Motor Arm - left: 0 5b Motor Arm - Right: 0 6a Motor Leg - Left: 0 6b Motor Leg - Right: 0 7 Limb Ataxia: 0 8 Sensory: 0 9 Best Language: 0 10 Dysarthria: 0 11 Extinct and Inattention.: 0 TOTAL: 2     Labs   CBC:  Recent Labs  Lab 04/18/23 1343 04/18/23 1349  WBC 7.1  --   NEUTROABS 5.3  --   HGB 12.6* 13.3  HCT 38.1* 39.0  MCV 99.0  --   PLT 178  --     Basic Metabolic Panel:  Lab Results  Component Value Date   NA 140 04/18/2023   K 4.2 04/18/2023   CO2 31 02/24/2023   GLUCOSE 140 (H) 04/18/2023   BUN 26 (H) 04/18/2023   CREATININE 1.90 (H) 04/18/2023   CALCIUM 8.8 02/24/2023   GFRNONAA 34 (L) 01/17/2023   GFRAA 34 (L) 04/25/2019   Lipid Panel:  Lab Results  Component Value Date   LDLCALC 106 (H) 02/24/2023   HgbA1c:  Lab Results  Component Value Date   HGBA1C 5.3 07/27/2021   Urine Drug Screen:     Component Value Date/Time   LABOPIA NONE DETECTED 07/22/2021 0728   COCAINSCRNUR NONE DETECTED 07/22/2021 0728   LABBENZ POSITIVE (A) 07/22/2021 0728   AMPHETMU NONE DETECTED 07/22/2021 0728   THCU NONE DETECTED 07/22/2021 0728   LABBARB NONE DETECTED 07/22/2021 0728    Alcohol Level     Component Value Date/Time   ETH <10 07/22/2021 0633   INR  Lab Results  Component Value Date   INR 1.1 02/20/2022   APTT  Lab Results  Component Value Date   APTT 25 07/22/2021   AED  levels: No results found for: "PHENYTOIN", "ZONISAMIDE", "LAMOTRIGINE", "LEVETIRACETA"   CT Head without contrast(Personally reviewed): No acute abnormality   MRI Brain(Personally reviewed): Pending   Impression   William [name] is a 82 y.o. male with history of Parkinson's disease, vascular dementia, CKD, hypertension, hyperlipidemia, CAD and strokes who presents with a transient episode of dizziness, wooziness and diaphoresis with possible slurred speech and right-sided weakness.  Patient states that he was eating lunch and suddenly felt dizzy, woozy and diaphoretic and did not have any slurred speech or right-sided weakness, however this was observed by others at the [facility] facility.  On exam, he has a left-sided facial droop but this is a chronic deficit from his previous stroke.  No right-sided weakness or dysarthria was observed, and patient's wife observes that his speech has returned to baseline.  CT head revealed no acute abnormalities, so we will proceed with MRI brain and full stroke workup if MRI is positive.  If MRI is negative, episode is more likely to be a cardiac issue than a neurological problem, or possibly a UTI given patient's history of previous episodes.  Recommendations  -MRI brain -Proceed with stroke workup if stroke seen on MRI -Continue home plavix -Urinalysis -Otherwise workup of likely cardiac issue per primary team ______________________________________________________________________  Patient seen by NP and then by MD, MD to edit note as needed.  Cortney E Ernestina Columbia , MSN, AGACNP-BC [facility] See Amion for schedule and pager information 04/18/2023 2:17 PM   Attending Neurohospitalist Addendum Patient seen and examined with APP/Resident. Agree with the history and physical as documented above.  Agree with the plan as documented, which I helped formulate. I have edited the note above to reflect my full findings and recommendations. I  have independently reviewed the chart, obtained history, review of systems and examined the patient.I have personally reviewed pertinent head/neck/spine imaging (CT/MRI). Please feel free to call with any questions.  -- Bing Neighbors, MD [facility] [phone]  If 7pm- 7am, please page neurology on call as listed in AMION.
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
## 2023-04-18 NOTE — Code Documentation (Signed)
Wasif Simonich is an 82 yr old male with Parkinson's Disease presenting to [facility] via [facility] on 04/18/2023. He is coming from [facility] where he was last known well at 1234 today when his lunch was served. Sometime after that, staff found him pale, sweaty, and "spaced out". He was weak on the Right side at that time as well. (He has prior Right sided CVA with left sided deficits). Pt on Plavix.    Pt met at bridge by Stroke Team. Labs, CBG obtained. Airway cleared by EDP. Pt to CT with team. Current deficits are disoriented to Month, and Lt facial droop. (NIHSS 2). The following imaging was obtained, CT head. Per Dr. Selina Cooley, CT head negative for acute hemorrhage.     Pt back to ED where his workup will continue. He will need q 2 hr VS and NIHSS. As some degree of memory loss and Left facial droop are his baseline, please Reactivate code stroke if new symptoms occur. Handoff with ED RN Paden complete.

## 2023-04-18 NOTE — ED Notes (Signed)
ED TO INPATIENT HANDOFF REPORT  ED Nurse Name and Phone #: Donny Pique, RN [phone]  S Name/Age/Gender William [name] 82 y.o. male Room/Bed: 015C/015C  Code Status   Code Status: Do not attempt resuscitation (DNR) PRE-ARREST INTERVENTIONS DESIRED  Home/SNF/Other Home Patient oriented to: self, place, time, and situation Is this baseline? Yes   Triage Complete: Triage complete  Chief Complaint Acute encephalopathy [G93.40]  Triage Note Patient BIB [facility] from [facility] for reported stroke symptoms. Patient reports no deficits, family and staff report right sided weakness, slurred speech and sensation differences. Patient is A&Ox4, able to stand with EMS. VSS   Allergies Allergies  Allergen Reactions   Bee Venom Anaphylaxis   Strawberry Extract Hives   Latex Itching   Zetia [Ezetimibe] Other (See Comments)    Intolerance    Adhesive [Tape] Other (See Comments)    blisters   Statins Other (See Comments)    myalgias    Level of Care/Admitting Diagnosis ED Disposition     ED Disposition  Admit   Condition  --   Comment  Hospital Area: [facility] [[id number]]  Level of Care: Telemetry Medical [104]  May place patient in observation at [facility] or [facility] if equivalent level of care is available:: No  Covid Evaluation: Asymptomatic - no recent exposure (last 10 days) testing not required  Diagnosis: Acute encephalopathy [[id number]]  Admitting Physician: JONAVAN, VANHORN [[id number]]  Attending Physician: CHAS, AXEL [[id number]]          B Medical/Surgery History Past Medical History:  Diagnosis Date   Arthritis    low back   Basal cell carcinoma of face 12/26/2014   Mohs surgery jan 2016    Bladder stone    BPH (benign prostatic hyperplasia) 08/06/2007   Chronic kidney disease 2014   Stage III   Closed fracture of fifth metacarpal bone 05/15/2015   Eczema    Fasting hyperglycemia 12/21/2006   GERD (gastroesophageal reflux  disease)    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   HTN (hypertension) 07/19/2015   Hyperlipidemia    Major neurocognitive disorder 01/09/2014   Mild, related to stroke history   Nocturia    Renal insufficiency 06/25/2013   S/P carotid endarterectomy    BILATERAL ICA--  PATENT PER DUPLEX  05-19-2012   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   Right calf   Urinary frequency    Vitamin D deficiency    Past Surgical History:  Procedure Laterality Date   APPENDECTOMY  AS CHILD   CARDIOVASCULAR STRESS TEST  03-27-2012  DR CRENSHAW   LOW RISK LEXISCAN STUDY-- PROBABLE NORMAL PERFUSION AND SOFT TISSUE ATTENUATION/  NO ISCHEMIA/ EF 51%   CAROTID ENDARTERECTOMY Bilateral LEFT  11-12-2008  DR GREG HAYES   RIGHT ICA  2006  ([facility])   CHOLECYSTECTOMY N/A 02/23/2022   Procedure: LAPAROSCOPIC CHOLECYSTECTOMY;  Surgeon: Quentin Ore, MD;  Location: [facility] OR;  Service: General;  Laterality: N/A;   CYSTOSCOPY W/ RETROGRADES Bilateral 06/22/2021   Procedure: CYSTOSCOPY WITH RETROGRADE PYELOGRAM;  Surgeon: Marcine Matar, MD;  Location: [facility];  Service: Urology;  Laterality: Bilateral;   CYSTOSCOPY WITH LITHOLAPAXY N/A 02/26/2013   Procedure: CYSTOSCOPY WITH LITHOLAPAXY;  Surgeon: Marcine Matar, MD;  Location: [facility];  Service: Urology;  Laterality: N/A;   ENDOSCOPIC RETROGRADE CHOLANGIOPANCREATOGRAPHY (ERCP) WITH PROPOFOL N/A 02/22/2022   Procedure: ENDOSCOPIC RETROGRADE CHOLANGIOPANCREATOGRAPHY (ERCP) WITH PROPOFOL;  Surgeon:  Jeani Hawking, MD;  Location: [facility] ENDOSCOPY;  Service: Gastroenterology;  Laterality: N/A;   EYE SURGERY  Jan. 2016   cataract surgery both eyes   INGUINAL HERNIA REPAIR Right 11-08-2006   IR KYPHO EA ADDL LEVEL THORACIC OR LUMBAR  02/12/2021   IR RADIOLOGIST EVAL & MGMT  02/18/2021   MASS EXCISION N/A 03/03/2016   Procedure: EXCISION OF BACK  MASS;  Surgeon: Almond Lint, MD;  Location: [facility]  [place];  Service: General;  Laterality: N/A;   MOHS SURGERY Left 1/ 2016   Dr Margo Aye-- Basal cell   PROSTATE SURGERY     REMOVAL OF STONES  02/22/2022   Procedure: REMOVAL OF STONES;  Surgeon: Jeani Hawking, MD;  Location: [facility] ENDOSCOPY;  Service: Gastroenterology;;   Dennison Mascot  02/22/2022   Procedure: Dennison Mascot;  Surgeon: Jeani Hawking, MD;  Location: [facility] ENDOSCOPY;  Service: Gastroenterology;;   TRANSURETHRAL RESECTION OF BLADDER TUMOR WITH MITOMYCIN-C N/A 06/22/2021   Procedure: TRANSURETHRAL RESECTION OF BLADDER TUMOR;  Surgeon: Marcine Matar, MD;  Location: [facility];  Service: Urology;  Laterality: N/A;   TRANSURETHRAL RESECTION OF PROSTATE N/A 02/26/2013   Procedure: TRANSURETHRAL RESECTION OF THE PROSTATE WITH GYRUS INSTRUMENTS;  Surgeon: Marcine Matar, MD;  Location: [facility];  Service: Urology;  Laterality: N/A;   TRANSURETHRAL RESECTION OF PROSTATE N/A 06/22/2021   Procedure: TRANSURETHRAL RESECTION OF THE PROSTATE (TURP);  Surgeon: Marcine Matar, MD;  Location: [facility];  Service: Urology;  Laterality: N/A;     A IV Location/Drains/Wounds Patient Lines/Drains/Airways Status     Active Line/Drains/Airways     Name Placement date Placement time Site Days   Peripheral IV 04/18/23 18 G Right Antecubital 04/18/23  1409  Antecubital  less than 1   Incision - 4 Ports Abdomen 1: Umbilicus 2: Mid;Upper 3: Umbilicus;Lower 4: Right;Lateral;Lower 02/23/22  1835  -- 419            Intake/Output Last 24 hours No intake or output data in the 24 hours ending 04/18/23 1600  Labs/Imaging Results for orders placed or performed during the hospital encounter of 04/18/23 (from the past 48 hour(s))  Ethanol     Status: None   Collection Time: 04/18/23  1:43 PM  Result Value Ref Range   Alcohol, Ethyl (B) <10 <10 mg/dL    Comment: (NOTE) Lowest detectable limit for serum alcohol is 10 mg/dL.  For medical  purposes only. Performed at [facility], [address] [place], [place] [address]   Protime-INR     Status: None   Collection Time: 04/18/23  1:43 PM  Result Value Ref Range   Prothrombin Time 13.6 11.4 - 15.2 seconds   INR 1.0 0.8 - 1.2    Comment: (NOTE) INR goal varies based on device and disease states. Performed at [facility], [address] [place], [place] [address]   APTT     Status: Abnormal   Collection Time: 04/18/23  1:43 PM  Result Value Ref Range   aPTT 23 (L) 24 - 36 seconds    Comment: Performed at [facility], [address], [place], [place] [address]  CBC     Status: Abnormal   Collection Time: 04/18/23  1:43 PM  Result Value Ref Range   WBC 7.1 4.0 - 10.5 K/uL   RBC 3.85 (L) 4.22 - 5.81 MIL/uL   Hemoglobin 12.6 (L) 13.0 - 17.0 g/dL   HCT 03.4 (L) 74.2 - 59.5 %  MCV 99.0 80.0 - 100.0 fL   MCH 32.7 26.0 - 34.0 pg   MCHC 33.1 30.0 - 36.0 g/dL   RDW 57.8 46.9 - 62.9 %   Platelets 178 150 - 400 K/uL   nRBC 0.0 0.0 - 0.2 %    Comment: Performed at [facility], [address], [place], [place] [address]  Differential     Status: None   Collection Time: 04/18/23  1:43 PM  Result Value Ref Range   Neutrophils Relative % 74 %   Neutro Abs 5.3 1.7 - 7.7 K/uL   Lymphocytes Relative 16 %   Lymphs Abs 1.2 0.7 - 4.0 K/uL   Monocytes Relative 7 %   Monocytes Absolute 0.5 0.1 - 1.0 K/uL   Eosinophils Relative 2 %   Eosinophils Absolute 0.1 0.0 - 0.5 K/uL   Basophils Relative 1 %   Basophils Absolute 0.0 0.0 - 0.1 K/uL   Immature Granulocytes 0 %   Abs Immature Granulocytes 0.03 0.00 - 0.07 K/uL    Comment: Performed at [facility], [address] [place], [place] [address]  Comprehensive metabolic panel     Status: Abnormal   Collection Time: 04/18/23  1:43 PM  Result Value Ref Range   Sodium 139 135 - 145 mmol/L   Potassium 4.2 3.5 - 5.1 mmol/L   Chloride 103 98 - 111 mmol/L   CO2 24 22 - 32 mmol/L   Glucose, Bld 136  (H) 70 - 99 mg/dL    Comment: Glucose reference range applies only to samples taken after fasting for at least 8 hours.   BUN 23 8 - 23 mg/dL   Creatinine, Ser 1.02 (H) 0.61 - 1.24 mg/dL   Calcium 9.2 8.9 - 72.5 mg/dL   Total Protein 6.9 6.5 - 8.1 g/dL   Albumin 3.8 3.5 - 5.0 g/dL   AST 15 15 - 41 U/L   ALT 5 0 - 44 U/L   Alkaline Phosphatase 140 (H) 38 - 126 U/L   Total Bilirubin 0.6 <1.2 mg/dL   GFR, Estimated 37 (L) >60 mL/min    Comment: (NOTE) Calculated using the CKD-EPI Creatinine Equation (2021)    Anion gap 12 5 - 15    Comment: Performed at [facility], [address], [place], [place] [address]  CBG monitoring, ED     Status: Abnormal   Collection Time: 04/18/23  1:43 PM  Result Value Ref Range   Glucose-Capillary 135 (H) 70 - 99 mg/dL    Comment: Glucose reference range applies only to samples taken after fasting for at least 8 hours.  I-stat chem 8, ED     Status: Abnormal   Collection Time: 04/18/23  1:49 PM  Result Value Ref Range   Sodium 140 135 - 145 mmol/L   Potassium 4.2 3.5 - 5.1 mmol/L   Chloride 105 98 - 111 mmol/L   BUN 26 (H) 8 - 23 mg/dL   Creatinine, Ser 0.34 (H) 0.61 - 1.24 mg/dL   Glucose, Bld 742 (H) 70 - 99 mg/dL    Comment: Glucose reference range applies only to samples taken after fasting for at least 8 hours.   Calcium, Ion 1.03 (L) 1.15 - 1.40 mmol/L   TCO2 23 22 - 32 mmol/L   Hemoglobin 13.3 13.0 - 17.0 g/dL   HCT 59.5 63.8 - 75.6 %   *Note: Due to a large number of results and/or encounters for the requested time period, some results  have not been displayed. A complete set of results can be found in Results Review.   CT HEAD CODE STROKE WO CONTRAST  Result Date: 04/18/2023 CLINICAL DATA:  Code stroke.  Slurred speech EXAM: CT HEAD WITHOUT CONTRAST TECHNIQUE: Contiguous axial images were obtained from the base of the skull through the vertex without intravenous contrast. RADIATION DOSE REDUCTION: This exam was performed according to  the departmental dose-optimization program which includes automated exposure control, adjustment of the mA and/or kV according to patient size and/or use of iterative reconstruction technique. COMPARISON:  02/26/2022 FINDINGS: Brain: No evidence of acute infarction, hemorrhage, mass, mass effect, or midline shift. No hydrocephalus or extra-axial collection. Redemonstrated extensive encephalomalacia in the right frontal and parietal lobes. Remote left basal ganglia infarct. Ex vacuo dilatation of the lateral ventricles. Periventricular white matter changes, likely the sequela of chronic small vessel ischemic disease. Vascular: No hyperdense vessel. Skull: Negative for fracture or focal lesion. Sinuses/Orbits: No acute finding. Other: The mastoid air cells are well aerated. ASPECTS [facility] Stroke Program Early CT Score) - Ganglionic level infarction (caudate, lentiform nuclei, internal capsule, insula, M1-M3 cortex): 7 - Supraganglionic infarction (M4-M6 cortex): 3 Total score (0-10 with 10 being normal): 10 IMPRESSION: No acute intracranial process. ASPECTS is 10. Imaging results were communicated on 04/18/2023 at 1:58 pm to provider Dr. Selina Cooley via secure text paging. Electronically Signed   By: Wiliam Ke M.D.   On: 04/18/2023 13:58    Pending Labs Unresulted Labs (From admission, onward)     Start     Ordered   04/18/23 1552  Lipid panel  (Labs)  Add-on,   AD       Comments: Fasting    04/18/23 1551   04/18/23 1552  Hemoglobin A1c  (Labs)  Add-on,   AD       Comments: To assess prior glycemic control    04/18/23 1551   04/18/23 1343  Urine rapid drug screen (hosp performed)  Once,   STAT        04/18/23 1343   04/18/23 1343  Urinalysis, Routine w reflex microscopic -Urine, Catheterized  Once,   URGENT       Question:  Specimen Source  Answer:  Urine, Catheterized   04/18/23 1343            Vitals/Pain Today's Vitals   04/18/23 1358 04/18/23 1400 04/18/23 1406 04/18/23 1426  BP: (!)  168/97 (!) 170/74  (!) 151/66  Pulse: 62 64  63  Resp: 20 18  (!) 21  Temp:    97.8 F (36.6 C)  TempSrc:    Oral  SpO2: 100% 100%  100%  Weight:   75.6 kg   Height:   5\' 9"  (1.753 m)   PainSc:   0-No pain     Isolation Precautions No active isolations  Medications Medications   stroke: early stages of recovery book (has no administration in time range)  0.9 %  sodium chloride infusion (has no administration in time range)  acetaminophen (TYLENOL) tablet 650 mg (has no administration in time range)    Or  acetaminophen (TYLENOL) 160 MG/5ML solution 650 mg (has no administration in time range)    Or  acetaminophen (TYLENOL) suppository 650 mg (has no administration in time range)  senna-docusate (Senokot-S) tablet 1 tablet (has no administration in time range)  heparin injection 5,000 Units (has no administration in time range)    Mobility walks with device     Focused Assessments    R  Recommendations: See Admitting Provider Note  Report given to:   Additional Notes: Patient lives at home but [facility] had acute onset of symptoms, NIH has been 2 since arrival. VSS, no complaints of pain and has been adamant that he did not need to come at all. Patient is very soft spoken and wife is at bedside and helpful.

## 2023-04-18 NOTE — Plan of Care (Signed)

## 2023-04-18 NOTE — ED Triage Notes (Signed)
Patient BIB [facility] from [facility] for reported stroke symptoms. Patient reports no deficits, family and staff report right sided weakness, slurred speech and sensation differences. Patient is A&Ox4, able to stand with EMS. VSS

## 2023-04-19 ENCOUNTER — Observation Stay (HOSPITAL_COMMUNITY): Payer: PPO

## 2023-04-19 ENCOUNTER — Ambulatory Visit: Payer: PPO | Admitting: Physical Therapy

## 2023-04-19 ENCOUNTER — Other Ambulatory Visit (HOSPITAL_COMMUNITY): Payer: Self-pay

## 2023-04-19 ENCOUNTER — Other Ambulatory Visit (HOSPITAL_COMMUNITY): Payer: PPO

## 2023-04-19 DIAGNOSIS — R9089 Other abnormal findings on diagnostic imaging of central nervous system: Secondary | ICD-10-CM | POA: Diagnosis not present

## 2023-04-19 DIAGNOSIS — R29818 Other symptoms and signs involving the nervous system: Secondary | ICD-10-CM | POA: Diagnosis not present

## 2023-04-19 DIAGNOSIS — R569 Unspecified convulsions: Secondary | ICD-10-CM

## 2023-04-19 DIAGNOSIS — G9340 Encephalopathy, unspecified: Secondary | ICD-10-CM | POA: Diagnosis not present

## 2023-04-19 LAB — VITAMIN B12: Vitamin B-12: 203 pg/mL (ref 180–914)

## 2023-04-19 MED ORDER — HYDRALAZINE HCL 25 MG PO TABS: 25.0000 mg | ORAL_TABLET | Freq: Three times a day (TID) | ORAL | Status: DC

## 2023-04-19 MED ORDER — CYANOCOBALAMIN 1000 MCG/ML IJ SOLN
1000.0000 ug | Freq: Every day | INTRAMUSCULAR | Status: DC
  Administered 2023-04-19: 1000 ug via SUBCUTANEOUS
  Filled 2023-04-19: qty 1

## 2023-04-19 MED ORDER — CEPHALEXIN 250 MG/5ML PO SUSR
250.0000 mg | Freq: Two times a day (BID) | ORAL | 0 refills | Status: AC
  Filled 2023-04-19: qty 100, 10d supply, fill #0

## 2023-04-19 MED ORDER — SODIUM CHLORIDE 0.9 % IV SOLN
1.0000 g | Freq: Every day | INTRAVENOUS | Status: DC
  Administered 2023-04-19: 1 g via INTRAVENOUS
  Filled 2023-04-19: qty 10

## 2023-04-19 MED ORDER — AMLODIPINE BESYLATE 10 MG PO TABS: 10.0000 mg | ORAL_TABLET | Freq: Every morning | ORAL | Status: DC

## 2023-04-19 MED ORDER — VITAMIN B-12 1000 MCG PO TABS
1000.0000 ug | ORAL_TABLET | Freq: Every day | ORAL | 0 refills | Status: DC
  Filled 2023-04-19: qty 30, 30d supply, fill #0

## 2023-04-19 NOTE — Evaluation (Signed)
Physical Therapy Evaluation Patient Details Name: William [name] MRN: [id number] DOB: 1941/05/29 Today's Date: 04/19/2023  History of Present Illness  Pt is an 82 y/o M admitted on 04/18/23 after being acute altered while eating lunch. Pt is being treated for acute metabolic encephalopathy. PMH: HTN, HLD, CVA with chronic L sided weakness, Parkinson's disease, vascular dementia, BPH, bladder CA, GERD, arthritis  Clinical Impression  Pt seen for PT evaluation with pt agreeable, wife present for session. Wife reports prior to admission she provided assistance for all mobility. On this date, pt requires min<>mod assist for supine>sit to exit L side of bed with HOB elevated, bed rails. Pt requires cuing & assistance to fully scoot to sitting EOB & demonstrates posterior lean at times. Pt is able to ambulate to nurses station & back with RUE HHA & min assist with impaired gait pattern as noted below. Will continue to follow pt acutely to address balance, endurance, & gait.         If plan is discharge home, recommend the following: A little help with walking and/or transfers;Assistance with cooking/housework;A lot of help with bathing/dressing/bathroom;Direct supervision/assist for financial management;Supervision due to cognitive status;Assist for transportation;Help with stairs or ramp for entrance;Direct supervision/assist for medications management   Can travel by private vehicle        Equipment Recommendations None recommended by PT  Recommendations for Other Services       Functional Status Assessment Patient has had a recent decline in their functional status and demonstrates the ability to make significant improvements in function in a reasonable and predictable amount of time.     Precautions / Restrictions Precautions Precautions: Fall Restrictions Weight Bearing Restrictions: No      Mobility  Bed Mobility Overal bed mobility: Needs Assistance Bed Mobility: Supine to Sit      Supine to sit: Min assist, Mod assist, HOB elevated, Used rails     General bed mobility comments: ongoing cuing re: hand placement, sequencing/initiation for supine>sit, assistance to fully scoot to sitting EOB.    Transfers Overall transfer level: Needs assistance Equipment used: 1 person hand held assist Transfers: Sit to/from Stand Sit to Stand: Min assist           General transfer comment: STS from EOB    Ambulation/Gait Ambulation/Gait assistance: Min assist Gait Distance (Feet): 100 Feet Assistive device: 1 person hand held assist Gait Pattern/deviations: Decreased stride length, Decreased dorsiflexion - right, Decreased dorsiflexion - left, Decreased step length - left, Decreased step length - right, Shuffle Gait velocity: decreased     General Gait Details: decreased heel strike BLE, decreased hip/knee flexion during swing phase, decreased foot clearance, cuing for increased step length but pt stating he cannot, 1 LOB with min assist to correct  Stairs            Wheelchair Mobility     Tilt Bed    Modified Rankin (Stroke Patients Only)       Balance Overall balance assessment: Needs assistance Sitting-balance support: No upper extremity supported, Feet supported Sitting balance-Leahy Scale: Fair Sitting balance - Comments: CGA sitting EOB, pt with posterior lean while feet are not touching floor prior to pt scooting all the way to edge of bed, cuing to correct posterior lean   Standing balance support: Single extremity supported, During functional activity Standing balance-Leahy Scale: Poor  Pertinent Vitals/Pain Pain Assessment Pain Assessment: No/denies pain    Home Living Family/patient expects to be discharged to:: Private residence Living Arrangements: Spouse/significant other Available Help at Discharge: Family;Available 24 hours/day Type of Home: House Home Access: Level entry       Home  Layout: One level Home Equipment: Tub bench;BSC/3in1;Hospital bed;Wheelchair - manual;Lift chair Additional Comments: Pt's wife is pt's caregiver, pt also attends [facility] adult day [facility] 3 days/week.    Prior Function Prior Level of Function : Needs assist             Mobility Comments: Wife uses gait belt, provides HHA for ambulation PRN, wife stands on R side of pt to assist him with mobility. ADLs Comments: Pt's wife helps with all dressing/bathing     Extremity/Trunk Assessment   Upper Extremity Assessment Upper Extremity Assessment: Generalized weakness;Left hand dominant LUE Deficits / Details: hx of CVA with L elbow flexted into side during mobiltiy LUE: Shoulder pain with ROM LUE Sensation: WNL LUE Coordination: decreased fine motor    Lower Extremity Assessment Lower Extremity Assessment: Generalized weakness       Communication   Communication Communication: Difficulty communicating thoughts/reduced clarity of speech (speaks at low volume)  Cognition Arousal: Alert Behavior During Therapy: WFL for tasks assessed/performed Overall Cognitive Status: History of cognitive impairments - at baseline                                 General Comments: Pt with hx of dementia but oriented to himself & wife, does believe OT in room is his son, but able to recall PT's name during session. Pt follows simple commands with extra time & multimodal cuing with decreased motor planning.        General Comments General comments (skin integrity, edema, etc.): Pt required assistance to don tennis shoes sitting EOB.    Exercises     Assessment/Plan    PT Assessment Patient needs continued PT services  PT Problem List Decreased strength;Decreased coordination;Decreased cognition;Decreased activity tolerance;Decreased balance;Decreased mobility;Decreased knowledge of precautions;Decreased safety awareness;Decreased knowledge of use of DME;Decreased range of  motion       PT Treatment Interventions DME instruction;Balance training;Modalities;Gait training;Neuromuscular re-education;Stair training;Functional mobility training;Therapeutic activities;Therapeutic exercise;Manual techniques;Patient/family education    PT Goals (Current goals can be found in the Care Plan section)  Acute Rehab PT Goals Patient Stated Goal: return to home, resume OPPT services PT Goal Formulation: With patient/family Time For Goal Achievement: 05/03/23 Potential to Achieve Goals: Good    Frequency Min 1X/week     Co-evaluation               AM-PAC PT "6 Clicks" Mobility  Outcome Measure Help needed turning from your back to your side while in a flat bed without using bedrails?: A Little Help needed moving from lying on your back to sitting on the side of a flat bed without using bedrails?: A Lot Help needed moving to and from a bed to a chair (including a wheelchair)?: A Little Help needed standing up from a chair using your arms (e.g., wheelchair or bedside chair)?: A Little Help needed to walk in hospital room?: A Little Help needed climbing 3-5 steps with a railing? : A Lot 6 Click Score: 16    End of Session Equipment Utilized During Treatment: Gait belt Activity Tolerance: Patient tolerated treatment well Patient left: in chair;with chair alarm set;with call bell/phone within reach;with family/visitor  present Nurse Communication: Mobility status PT Visit Diagnosis: Unsteadiness on feet (R26.81);Other abnormalities of gait and mobility (R26.89);Muscle weakness (generalized) (M62.81)    Time: 4696-2952 PT Time Calculation (min) (ACUTE ONLY): 20 min   Charges:   PT Evaluation $PT Eval Low Complexity: 1 Low   PT General Charges $$ ACUTE PT VISIT: 1 Visit         Aleda Grana, PT, DPT 04/19/23, 2:01 PM   Sandi Mariscal 04/19/2023, 1:59 PM

## 2023-04-19 NOTE — Progress Notes (Signed)
   04/19/23 1058  TOC Brief Assessment  Insurance and Status Reviewed ([employer] P[employer])  Patient has primary care physician Yes (Busy - Default status  Donato Schultz, DO)  Home environment has been reviewed From home with Wife  Prior level of function: dependent  Prior/Current Home Services No current home services (Current with   OPPT at [facility] [employer])  Social Determinants of Health Reivew SDOH reviewed no interventions necessary  Readmission risk has been reviewed Yes (N/A listed)  Transition of care needs transition of care needs identified, TOC will continue to follow   Patient from home with Wife- Was attending OP therapy at [facility]. Will add to DC paperwork for resumption

## 2023-04-19 NOTE — Progress Notes (Signed)
Pt's wheelchair delivered to room and TOC meds will be picked up when taking pt down for discharge. Pt's wife left to go downstairs to eat and left her cell phone in pt's room so unable to call her to let her know pt is ready for discharge. Will discharge pt as soon as wife arrives back to floor.

## 2023-04-19 NOTE — Progress Notes (Signed)
EEG complete - results pending 

## 2023-04-19 NOTE — Plan of Care (Signed)
No acute infarct on MRI brain. No further inpatient neurologic workup indicated at this time. Continue home plavix. Neurology will be available prn for questions going forward.  Bing Neighbors, MD [facility] [phone]  If 7pm- 7am, please page neurology on call as listed in AMION.

## 2023-04-19 NOTE — Procedures (Signed)
Patient Name: William [name]  MRN: [id number]  Epilepsy Attending: Charlsie Quest  Referring Physician/Provider: Clydie Braun, MD  Date: 04/19/2023 Duration: 23.19 mins  Patient history: 82 y.o. male with a history of Parkinson's disease, vascular dementia, 2 strokes, CKD, hypertension, CAD and hyperlipidemia who presents after having an episode today where he was eating lunch and then suddenly felt dizzy, woozy and diaphoretic. EEG to evaluate for seizure  Level of alertness: Awake, drowsy  AEDs during EEG study: CBZ, Ativan  Technical aspects: This EEG study was done with scalp electrodes positioned according to the 10-20 International system of electrode placement. Electrical activity was reviewed with band pass filter of 1-70Hz , sensitivity of 7 uV/mm, display speed of 28mm/sec with a 60Hz  notched filter applied as appropriate. EEG data were recorded continuously and digitally stored.  Video monitoring was available and reviewed as appropriate.  Description: The posterior dominant rhythm consists of 9-10 Hz activity of moderate voltage (25-35 uV) seen predominantly in posterior head regions, symmetric and reactive to eye opening and eye closing. Drowsiness was characterized by attenuation of the posterior background rhythm. EEG showed continuous 3 to 6 Hz theta-delta slowing in right frontotemporal region. Physiologic photic driving was seen during photic stimulation.  Hyperventilation was not performed.     ABNORMALITY - Continuous slow, right frontotemporal region  IMPRESSION: This study is suggestive of cortical dysfunction arising from right frontotemporal region likely secondary to underlying stroke. No seizures or epileptiform discharges were seen throughout the recording.  Please note lack of epileptiform activity during interictal EEG does not exclude the diagnosis of epilepsy.   Laticia Vannostrand Annabelle Harman

## 2023-04-19 NOTE — Progress Notes (Signed)
The wheelchair that was delivered to room was not the wheelchair that pt wanted. [employer] will take wheelchair back. Another wheelchair will be delivered to pt's house.

## 2023-04-19 NOTE — Discharge Summary (Signed)
Physician Discharge Summary  [name] UXL:[id number] DOB: March 12, 1941 DOA: 04/18/2023  PCP: Donato Schultz, DO  Admit date: 04/18/2023 Discharge date: 04/19/2023  Admitted From: (Home) Disposition:  (Home )  Recommendations for Outpatient Follow-up:  Follow up with PCP in 1-2 weeks Please obtain BMP/CBC in one week Please follow the final results of urine cultures Please follow on B12 level 4 to 6 weeks after p.o. supplements  Diet recommendation: Heart Healthy   Brief/Interim Summary:  William [name] is a 82 y.o. male with medical history significant of hypertension, hyperlipidemia, history of CVA with chronic left-sided weakness, Parkinson's disease, vascular dementia, BPH, history of bladder cancer, GERD, and arthritis who presented after being noted to be acutely altered while eating lunch.  He started feeling dizzy, diaphoretic, and staff report he was noted to have slurred speech, and right-sided weakness, he was sent for further workup.  Wife reports patient had a presentation in the past with he was noted to have UTI.   Acute metabolic encephalopathy -MRI brain with no acute CVA -This is most likely in the setting of UTI -EEG was obtained, no evidence of seizure activities -Mentation back to baseline as discussed with wife today.  UTI -UA has been positive, urine culture has been sent, he did receive 1 dose of IV Rocephin during hospital stay, and he can be discharged on Keflex for another 5 days, I have instructed his wife to follow-up with his PCP to follow on the final results of urine cultures  Vitamin B12 deficiency -Level is low at 2-3, received 1 dose of IM B12 during hospital stay, he will be discharged on supplements, please recheck level in 4 to 6 weeks after p.o. supplement   Essential hypertension Resume home medications   Parkinson's disease -Continue carbidopa-levodopa and amantadine   History of CVA with residual deficit Patient has a prior  history of CVA with residual left-sided deficits.  MRI brain with no acute CVA, continue with home Plavix   Chronic kidney disease stage IIIb On admission creatinine noted to be 1.89 with BUN 23.  Creatinine appears similar checks over the last 2 months.    Dementia with behavioral disturbance -Continue Tegretol and Ativan   Hyperlipidemia Last LDL noted to be 106 on 02/24/2023, but patient noted to have intolerance to statins.   Anemia of chronic disease Hemoglobin 12.6 which appears around patient's baseline.  No reports of bleeding.   BPH -Continue tamsulosin   GERD -Continue Protonix  Discharge Diagnoses:  Principal Problem:   Acute encephalopathy Active Problems:   HTN (hypertension)   Parkinson's disease (HCC)   History of CVA with residual deficit   Chronic kidney disease, stage 3b (HCC)   Dementia with behavioral disturbance (HCC)   Anemia of chronic disease   Hyperlipidemia   BPH (benign prostatic hyperplasia)    Discharge Instructions  Discharge Instructions     Diet - low sodium heart healthy   Complete by: As directed    Increase activity slowly   Complete by: As directed       Allergies as of 04/19/2023       Reactions   Bee Venom Anaphylaxis   Strawberry Extract Hives   Latex Itching   Zetia [ezetimibe] Other (See Comments)   Intolerance   Adhesive [tape] Other (See Comments)   blisters   Statins Other (See Comments)   myalgias        Medication List     TAKE these medications  acetaminophen 325 MG tablet Commonly known as: TYLENOL Take 2 tablets (650 mg total) by mouth every 6 (six) hours as needed for mild pain.   Adult One Daily Gummies Chew Chew 1 tablet by mouth every morning.   amantadine 100 MG capsule Commonly known as: SYMMETREL Take 1 capsule (100 mg total) by mouth daily.   amLODipine 10 MG tablet Commonly known as: NORVASC Take 1 tablet (10 mg total) by mouth every morning. Start taking on: April 20, 2023    carBAMazepine 100 MG/5ML suspension Commonly known as: TEGretol Take 5 mLs (100 mg total) by mouth in the morning and in the afternoon, and then 10 mLs (200 mg total) at bedtime.   carbidopa-levodopa 25-100 MG tablet Commonly known as: SINEMET IR Take 1.5 tablets by mouth 3 (three) times daily.   cephALEXin 250 MG/5ML suspension Commonly known as: KEFLEX Take 5 mLs (250 mg total) by mouth 2 (two) times daily for 5 days. **Discard Remainder** Start taking on: April 20, 2023   clobetasol cream 0.05 % Commonly known as: TEMOVATE Apply 1 Application to affected areas topically up to 2 (two) times daily as needed. Do not apply to face, groin, or under arms   clopidogrel 75 MG tablet Commonly known as: PLAVIX Take 1 tablet (75 mg total) by mouth daily.   cyanocobalamin 1000 MCG tablet Commonly known as: VITAMIN B12 Take 1 tablet (1,000 mcg total) by mouth daily.   ferrous sulfate 325 (65 FE) MG EC tablet Take 325 mg by mouth 2 (two) times daily. *Crushed*   fluticasone 0.05 % cream Commonly known as: CUTIVATE 1 application  at bedtime as needed (psoriasis).   hydrALAZINE 25 MG tablet Commonly known as: APRESOLINE Take 1 tablet (25 mg total) by mouth 3 (three) times daily. Start taking on: April 20, 2023   LORazepam 1 MG tablet Commonly known as: Ativan Take 1/4 tablet by mouth in the morning, 1/2 tablet by mouth at lunch and 1 tablet by mouth at bedtime   pantoprazole 40 MG tablet Commonly known as: PROTONIX Take 1 tablet (40 mg total) by mouth daily.   polyethylene glycol 17 g packet Commonly known as: MIRALAX / GLYCOLAX Take 17 g by mouth daily. What changed: when to take this   tamsulosin 0.4 MG Caps capsule Commonly known as: FLOMAX Take 1 capsule (0.4 mg total) by mouth daily after supper. What changed: when to take this   traZODone 150 MG tablet Commonly known as: DESYREL Take 0.5 tablets (75 mg total) by mouth at bedtime. *Crushed*   triamcinolone  cream 0.1 % Commonly known as: KENALOG Apply 1 application topically 2 (two) times daily.   Vitamin D3 50 MCG (2000 UT) Chew Chew 1 tablet by mouth daily.        Follow-up Information     [facility] Follow up.   Specialty: Rehabilitation Why: Please call to continue your outpatient  therapy Contact information: [address] [place] [facility] [place] [address] [phone]               Allergies  Allergen Reactions   Bee Venom Anaphylaxis   Strawberry Extract Hives   Latex Itching   Zetia [Ezetimibe] Other (See Comments)    Intolerance    Adhesive [Tape] Other (See Comments)    blisters   Statins Other (See Comments)    myalgias    Consultations: Neurology   Procedures/Studies: EEG adult  Result Date: 04/22/23 Charlsie Quest, MD  04/19/2023 12:50 PM Patient Name: DINO BORNTREGER MRN: [id number] Epilepsy Attending: Charlsie Quest Referring Physician/Provider: Clydie Braun, MD Date: 04/19/2023 Duration: 23.19 mins Patient history: 82 y.o. male with a history of Parkinson's disease, vascular dementia, 2 strokes, CKD, hypertension, CAD and hyperlipidemia who presents after having an episode today where he was eating lunch and then suddenly felt dizzy, woozy and diaphoretic. EEG to evaluate for seizure Level of alertness: Awake, drowsy AEDs during EEG study: CBZ, Ativan Technical aspects: This EEG study was done with scalp electrodes positioned according to the 10-20 International system of electrode placement. Electrical activity was reviewed with band pass filter of 1-70Hz , sensitivity of 7 uV/mm, display speed of 57mm/sec with a 60Hz  notched filter applied as appropriate. EEG data were recorded continuously and digitally stored.  Video monitoring was available and reviewed as appropriate. Description: The posterior dominant rhythm consists of 9-10 Hz activity of moderate voltage (25-35 uV) seen predominantly in  posterior head regions, symmetric and reactive to eye opening and eye closing. Drowsiness was characterized by attenuation of the posterior background rhythm. EEG showed continuous 3 to 6 Hz theta-delta slowing in right frontotemporal region. Physiologic photic driving was seen during photic stimulation.  Hyperventilation was not performed.   ABNORMALITY - Continuous slow, right frontotemporal region IMPRESSION: This study is suggestive of cortical dysfunction arising from right frontotemporal region likely secondary to underlying stroke. No seizures or epileptiform discharges were seen throughout the recording. Please note lack of epileptiform activity during interictal EEG does not exclude the diagnosis of epilepsy. Charlsie Quest   MR BRAIN WO CONTRAST  Result Date: 04/19/2023 CLINICAL DATA:  Acute neurologic deficit EXAM: MRI HEAD WITHOUT CONTRAST TECHNIQUE: Multiplanar, multiecho pulse sequences of the brain and surrounding structures were obtained without intravenous contrast. COMPARISON:  07/27/2021 FINDINGS: Brain: No acute infarct, mass effect or extra-axial collection. There are multiple old infarcts mostly within the right MCA territory. There is a small amount associated chronic hemosiderin deposition. No acute hemorrhage. Fluent white matter hyperintense T2-weighted signal. Moderate volume loss. The midline structures are normal. Vascular: Normal flow voids. Skull and upper cervical spine: Normal calvarium and skull base. Visualized upper cervical spine and soft tissues are normal. Sinuses/Orbits:No paranasal sinus fluid levels or advanced mucosal thickening. No mastoid or middle ear effusion. Normal orbits. IMPRESSION: 1. No acute intracranial abnormality. 2. Multiple old infarcts mostly within the right MCA territory. Electronically Signed   By: Deatra Robinson M.D.   On: 04/19/2023 01:37   DG CHEST PORT 1 VIEW  Result Date: 04/18/2023 CLINICAL DATA:  Altered mental status. EXAM: PORTABLE  CHEST 1 VIEW COMPARISON:  Chest radiograph dated 07/15/2022 and CT dated 03/03/2023. FINDINGS: No focal consolidation, pleural effusion, or pneumothorax. The cardiac silhouette is within normal limits. No acute osseous pathology. Degenerative changes of the spine. IMPRESSION: No active disease. Electronically Signed   By: Elgie Collard M.D.   On: 04/18/2023 19:08   CT HEAD CODE STROKE WO CONTRAST  Result Date: 04/18/2023 CLINICAL DATA:  Code stroke.  Slurred speech EXAM: CT HEAD WITHOUT CONTRAST TECHNIQUE: Contiguous axial images were obtained from the base of the skull through the vertex without intravenous contrast. RADIATION DOSE REDUCTION: This exam was performed according to the departmental dose-optimization program which includes automated exposure control, adjustment of the mA and/or kV according to patient size and/or use of iterative reconstruction technique. COMPARISON:  02/26/2022 FINDINGS: Brain: No evidence of acute infarction, hemorrhage, mass, mass effect, or midline shift. No hydrocephalus or extra-axial collection. Redemonstrated  extensive encephalomalacia in the right frontal and parietal lobes. Remote left basal ganglia infarct. Ex vacuo dilatation of the lateral ventricles. Periventricular white matter changes, likely the sequela of chronic small vessel ischemic disease. Vascular: No hyperdense vessel. Skull: Negative for fracture or focal lesion. Sinuses/Orbits: No acute finding. Other: The mastoid air cells are well aerated. ASPECTS [facility] Stroke Program Early CT Score) - Ganglionic level infarction (caudate, lentiform nuclei, internal capsule, insula, M1-M3 cortex): 7 - Supraganglionic infarction (M4-M6 cortex): 3 Total score (0-10 with 10 being normal): 10 IMPRESSION: No acute intracranial process. ASPECTS is 10. Imaging results were communicated on 04/18/2023 at 1:58 pm to provider Dr. Selina Cooley via secure text paging. Electronically Signed   By: Wiliam Ke M.D.   On: 04/18/2023  13:58   (Echo, Carotid, EGD, Colonoscopy, ERCP)    Subjective: Wife reports patient mentation back to baseline, patient today denies any complaints.  Discharge Exam: Vitals:   04/19/23 0420 04/19/23 1255  BP: (!) 139/53 (!) 123/101  Pulse:  67  Resp: 18 10  Temp: 97.9 F (36.6 C) 97.9 F (36.6 C)  SpO2:     Vitals:   04/18/23 2000 04/19/23 0013 04/19/23 0420 04/19/23 1255  BP: (!) 170/63 (!) 149/84 (!) 139/53 (!) 123/101  Pulse: 68 60  67  Resp: 13 17 18 10   Temp: 98.2 F (36.8 C) 97.8 F (36.6 C) 97.9 F (36.6 C) 97.9 F (36.6 C)  TempSrc: Oral Oral Oral Oral  SpO2: 98% 97%    Weight:      Height:        General: Pt is alert, awake, does not, with mild dementia, he is with mild residual left-sided weakness at baseline Cardiovascular: RRR, S1/S2 +, no rubs, no gallops Respiratory: CTA bilaterally, no wheezing, no rhonchi Abdominal: Soft, NT, ND, bowel sounds + Extremities: no edema, no cyanosis    The results of significant diagnostics from this hospitalization (including imaging, microbiology, ancillary and laboratory) are listed below for reference.     Microbiology: No results found for this or any previous visit (from the past 240 hour(s)).   Labs: BNP (last 3 results) No results for input(s): "BNP" in the last 8760 hours. Basic Metabolic Panel: Recent Labs  Lab 04/18/23 1343 04/18/23 1349  NA 139 140  K 4.2 4.2  CL 103 105  CO2 24  --   GLUCOSE 136* 140*  BUN 23 26*  CREATININE 1.82* 1.90*  CALCIUM 9.2  --    Liver Function Tests: Recent Labs  Lab 04/18/23 1343  AST 15  ALT 5  ALKPHOS 140*  BILITOT 0.6  PROT 6.9  ALBUMIN 3.8   No results for input(s): "LIPASE", "AMYLASE" in the last 168 hours. No results for input(s): "AMMONIA" in the last 168 hours. CBC: Recent Labs  Lab 04/18/23 1343 04/18/23 1349  WBC 7.1  --   NEUTROABS 5.3  --   HGB 12.6* 13.3  HCT 38.1* 39.0  MCV 99.0  --   PLT 178  --    Cardiac Enzymes: No results  for input(s): "CKTOTAL", "CKMB", "CKMBINDEX", "TROPONINI" in the last 168 hours. BNP: Invalid input(s): "POCBNP" CBG: Recent Labs  Lab 04/18/23 1343  GLUCAP 135*   D-Dimer No results for input(s): "DDIMER" in the last 72 hours. Hgb A1c Recent Labs    04/18/23 1716  HGBA1C 4.9   Lipid Profile Recent Labs    04/18/23 1716  CHOL 178  HDL 51  LDLCALC 112*  TRIG 76  CHOLHDL 3.5  Thyroid function studies No results for input(s): "TSH", "T4TOTAL", "T3FREE", "THYROIDAB" in the last 72 hours.  Invalid input(s): "FREET3" Anemia work up Recent Labs    04/19/23 0910  VITAMINB12 203   Urinalysis    Component Value Date/Time   COLORURINE YELLOW 04/18/2023 1930   APPEARANCEUR HAZY (A) 04/18/2023 1930   APPEARANCEUR Clear 08/27/2021 0740   LABSPEC 1.015 04/18/2023 1930   PHURINE 7.0 04/18/2023 1930   GLUCOSEU NEGATIVE 04/18/2023 1930   HGBUR SMALL (A) 04/18/2023 1930   HGBUR negative 09/25/2009 1535   BILIRUBINUR NEGATIVE 04/18/2023 1930   BILIRUBINUR small 08/03/2022 1638   BILIRUBINUR Negative 08/27/2021 0740   KETONESUR NEGATIVE 04/18/2023 1930   PROTEINUR 100 (A) 04/18/2023 1930   UROBILINOGEN 0.2 08/03/2022 1638   UROBILINOGEN 0.2 09/25/2009 1535   NITRITE POSITIVE (A) 04/18/2023 1930   LEUKOCYTESUR LARGE (A) 04/18/2023 1930   Sepsis Labs Recent Labs  Lab 04/18/23 1343  WBC 7.1   Microbiology No results found for this or any previous visit (from the past 240 hour(s)).   Time coordinating discharge: Over 30 minutes  SIGNED:   Huey Bienenstock, MD  [facility] 04/19/2023, 1:57 PM Pager   If 7PM-7AM, please contact night-coverage [email or web address] Password [email or web address]

## 2023-04-19 NOTE — Progress Notes (Signed)
Discharge instruction reviewed with patients wife Germaine Shenker. No questions at this time patient discharged to home

## 2023-04-19 NOTE — Plan of Care (Signed)
To whom it may concern- Patient William [name] may resume his previous activity /care plan at [facility] as previously done prior to his admission, as well he may resume his outpatient PT OT at [facility] center as well.  For any questions, please do not hesitate to call Huey Bienenstock, MD [phone]  Huey Bienenstock MD

## 2023-04-19 NOTE — Evaluation (Signed)
Occupational Therapy Evaluation Patient Details Name: William [name] MRN: [id number] DOB: 02-27-1941 Today's Date: 04/19/2023   History of Present Illness 82 y.o. male with medical history significant of hypertension, hyperlipidemia, history of CVA with chronic left-sided weakness, Parkinson's disease, vascular dementia, BPH, history of bladder cancer, GERD, and arthritis who presented after being noted to be acutely altered while eating lunch.  At baseline patient lives at home with his wife who helps care for him.   Clinical Impression   Pt c/o no pain, feeling good today. Pt's wife/caregiver present during session. Pt lives with wife in 1 level home, needs assistance with ADLs at baseline, uses gait belt and HHA for mobility around home. Pt currently close to baseline, max A for LB ADLs, min A for UB ADLs, min-mod for bed mobility. Pt has hospital bed at home, uses w/c out in community. Pt's wife has difficulty lifting the manual w/c in/out of car, would benefit from a transport chair for out in community. Pt at baseline and has no further acute OT needs, to continue OPPT upon DC.        If plan is discharge home, recommend the following: A little help with walking and/or transfers;A lot of help with bathing/dressing/bathroom;Assistance with cooking/housework;Assist for transportation;Help with stairs or ramp for entrance;Direct supervision/assist for medications management    Functional Status Assessment  Patient has had a recent decline in their functional status and demonstrates the ability to make significant improvements in function in a reasonable and predictable amount of time.  Equipment Recommendations  Other (comment) (transport chair)    Recommendations for Other Services       Precautions / Restrictions Precautions Precautions: Fall Restrictions Weight Bearing Restrictions: No      Mobility Bed Mobility Overal bed mobility: Needs Assistance Bed Mobility: Supine to  Sit, Sit to Supine     Supine to sit: Min assist, HOB elevated Sit to supine: Mod assist   General bed mobility comments: min-mod for in/out of bed, easier for supine to sit, L sided weakness limits participation    Transfers Overall transfer level: Needs assistance Equipment used: 1 person hand held assist Transfers: Sit to/from Stand, Bed to chair/wheelchair/BSC Sit to Stand: Min assist     Step pivot transfers: Min assist     General transfer comment: min A for power STS, min A for guiding with ambulation      Balance Overall balance assessment: Needs assistance Sitting-balance support: No upper extremity supported, Feet supported Sitting balance-Leahy Scale: Fair Sitting balance - Comments: EOB, occasional posterior lean   Standing balance support: Single extremity supported, During functional activity Standing balance-Leahy Scale: Fair Standing balance comment: no LOB, able to stand as needed for toileting/dressing, 1 person HHA                           ADL either performed or assessed with clinical judgement   ADL Overall ADL's : At baseline;Needs assistance/impaired Eating/Feeding: Set up;Sitting   Grooming: Minimal assistance;Sitting   Upper Body Bathing: Moderate assistance;Sitting   Lower Body Bathing: Moderate assistance;Sitting/lateral leans   Upper Body Dressing : Moderate assistance;Sitting   Lower Body Dressing: Maximal assistance;Sit to/from stand   Toilet Transfer: Minimal assistance;Ambulation   Toileting- Clothing Manipulation and Hygiene: Maximal assistance;Sit to/from stand       Functional mobility during ADLs: Minimal assistance General ADL Comments: Pt close to baseline, wife is fulltime caregiver. min A for transfers/mobility, fair balance, no LOB, "freezes"  while ambulating at times, min A to redirect. max A for LB dressing/bathing, min-mod for UB ADLs     Vision Baseline Vision/History: 0 No visual deficits Ability to  See in Adequate Light: 1 Impaired Patient Visual Report: Peripheral vision impairment;Other (comment) (L sided peripheral due to hx of stroke)       Perception         Praxis         Pertinent Vitals/Pain Pain Assessment Pain Assessment: No/denies pain     Extremity/Trunk Assessment Upper Extremity Assessment Upper Extremity Assessment: Generalized weakness;LUE deficits/detail LUE Deficits / Details: hx of stroke, L sided weakness, L wrist drop, overall good ability to use LUE. LUE: Shoulder pain with ROM LUE Sensation: WNL LUE Coordination: decreased fine motor           Communication Communication Communication: Difficulty communicating thoughts/reduced clarity of speech   Cognition Arousal: Alert Behavior During Therapy: WFL for tasks assessed/performed Overall Cognitive Status: History of cognitive impairments - at baseline                                 General Comments: able to state very close to the current date, able to repond as needed     General Comments       Exercises     Shoulder Instructions      Home Living Family/patient expects to be discharged to:: Private residence Living Arrangements: Spouse/significant other Available Help at Discharge: Family Type of Home: House Home Access: Level entry     Home Layout: One level     Bathroom Shower/Tub: Tub/shower unit         Home Equipment: Tub bench;BSC/3in1;Hospital bed;Wheelchair - manual;Lift chair      Lives With: Spouse    Prior Functioning/Environment Prior Level of Function : Needs assist             Mobility Comments: wife uses gait belt, no AD for ambulation. ADLs Comments: Pt's wife helps with all dressing/bathing        OT Problem List: Decreased range of motion;Decreased activity tolerance;Impaired balance (sitting and/or standing);Decreased safety awareness;Impaired UE functional use;Impaired tone      OT Treatment/Interventions: Self-care/ADL  training;Therapeutic exercise;Neuromuscular education;Energy conservation;Therapeutic activities;Balance training    OT Goals(Current goals can be found in the care plan section) Acute Rehab OT Goals Patient Stated Goal: to return home OT Goal Formulation: With patient/family Time For Goal Achievement: 05/03/23 Potential to Achieve Goals: Good  OT Frequency: Min 1X/week    Co-evaluation              AM-PAC OT "6 Clicks" Daily Activity     Outcome Measure Help from another person eating meals?: A Little Help from another person taking care of personal grooming?: A Little Help from another person toileting, which includes using toliet, bedpan, or urinal?: A Lot Help from another person bathing (including washing, rinsing, drying)?: A Lot Help from another person to put on and taking off regular upper body clothing?: A Lot Help from another person to put on and taking off regular lower body clothing?: A Lot 6 Click Score: 14   End of Session Equipment Utilized During Treatment: Gait belt Nurse Communication: Mobility status  Activity Tolerance: Patient tolerated treatment well Patient left: in bed;with call bell/phone within reach;with family/visitor present  OT Visit Diagnosis: Other abnormalities of gait and mobility (R26.89);Muscle weakness (generalized) (M62.81);Hemiplegia and hemiparesis Hemiplegia - Right/Left: Left  Time: 1610-9604 OT Time Calculation (min): 49 min Charges:  OT General Charges $OT Visit: 1 Visit OT Evaluation $OT Eval Low Complexity: 1 Low OT Treatments $Self Care/Home Management : 23-37 mins  [address], OTR/L   Alexis Goodell 04/19/2023, 12:35 PM

## 2023-04-19 NOTE — TOC Transition Note (Signed)
Transition of Care [facility]) - CM/SW Discharge Note   Patient Details  Name: William [name] MRN: [id number] Date of Birth: [birth year]  [facility]) CM/SW Contact:  Gordy Clement, RN Phone Number: 04/19/2023, 2:11 PM   Clinical Narrative:     Patient to dc to home .  Patient will resume their OP Therapy at [facility] location. A lightweight wheelchair has been ordered and will be delivered bedside by [employer]. Wife will transport  No additional TOC needs           Patient Goals and CMS Choice      Discharge Placement                         Discharge Plan and Services Additional resources added to the After Visit Summary for                                       Social Determinants of Health (SDOH) Interventions SDOH Screenings   Food Insecurity: No Food Insecurity (04/19/2023)  Housing: Low Risk  (04/19/2023)  Transportation Needs: No Transportation Needs (04/19/2023)  Utilities: Not At Risk (04/19/2023)  Depression (PHQ2-9): Low Risk  (10/08/2022)  Financial Resource Strain: Low Risk  (12/20/2022)  Physical Activity: Insufficiently Active (03/08/2023)  Social Connections: Moderately Isolated (12/20/2022)  Stress: Stress Concern Present (03/08/2023)  Tobacco Use: Medium Risk (04/18/2023)     Readmission Risk Interventions    02/23/2022    1:27 PM  Readmission Risk Prevention Plan  Transportation Screening Complete  PCP or Specialist Appt within 3-5 Days Complete  HRI or Home Care Consult Complete  Social Work Consult for Recovery Care Planning/Counseling Complete  Palliative Care Screening Complete  Medication Review [employer]) Complete

## 2023-04-19 NOTE — Evaluation (Addendum)
Speech Language Pathology Evaluation Patient Details Name: William [name] MRN: [id number] DOB: 23-Mar-1941 Today's Date: 04/19/2023 Time: 8295-6213 SLP Time Calculation (min) (ACUTE ONLY): 20 min  Problem List:  Patient Active Problem List   Diagnosis Date Noted   Acute encephalopathy 04/18/2023   Parkinson's disease (HCC) 04/18/2023   History of CVA with residual deficit 04/18/2023   Chronic kidney disease, stage 3b (HCC) 04/18/2023   Dementia with behavioral disturbance (HCC) 04/18/2023   Anemia of chronic disease 04/18/2023   Laceration of right upper extremity 02/24/2023   Dermatitis 02/24/2023   Lung nodules 02/24/2023   Dysuria 08/03/2022   Nonintractable headache 07/01/2022   Bilateral impacted cerumen 06/24/2022   Rash 06/15/2022   Postoperative ileus (HCC) 02/27/2022   Ileus, postoperative (HCC) 02/26/2022   Choledocholithiasis 02/19/2022   DNR (do not resuscitate) 02/19/2022   Left elbow pain 01/26/2022   Mid back pain on left side 01/26/2022   Rib pain 01/26/2022   Acute pain of left shoulder 11/12/2021   Leukocytes in urine 11/12/2021   Urinary frequency 11/12/2021   Thrush 10/08/2021   Hemiplegia, 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   Insomnia    Prediabetes    Acute 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 (HCC)    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   Transaminitis 07/27/2021   UTI (urinary tract infection) 07/27/2021   CVA (cerebral vascular accident) (HCC) 07/27/2021   Fall 07/27/2021   Hyperglycemia 07/27/2021   Cholelithiasis 07/27/2021   Hypoxia 07/27/2021   Nausea and vomiting 07/27/2021   Acute metabolic encephalopathy 07/27/2021   Normocytic anemia 07/27/2021   Chronic back pain 07/27/2021   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   Closed fracture of first lumbar vertebra with routine healing 02/03/2021   Closed fracture of multiple ribs 11/18/2020   Anxiety  01/29/2020   Leg pain, bilateral 01/29/2020   Ingrown toenail 07/13/2019   Lumbar spondylosis 05/02/2018   Pain in left knee 03/09/2018   Osteoarthritis of left hip 01/16/2018   Trochanteric bursitis of left hip 01/16/2018   Preventative health care 09/26/2017   HTN (hypertension) 07/19/2015   Hyperlipidemia 07/19/2015   Great toe pain 02/11/2014   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   Obesity (BMI 30-39.9) 06/25/2013   Renal insufficiency 06/25/2013   Weakness of left arm 06/25/2013   Sebaceous cyst 03/03/2011   Sprain of lumbar region 07/31/2010   Rib pain, left 08/29/2009   Carotid artery stenosis, asymptomatic, bilateral 05/02/2009   Eczema, atopic 05/31/2008   Vitamin D deficiency 03/01/2008   BPH (benign prostatic hyperplasia) 08/06/2007   Fasting hyperglycemia 12/21/2006   History of right MCA infarct 2006   Past Medical History:  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   HTN (hypertension) 07/19/2015   Hyperlipidemia    Major neurocognitive disorder 01/09/2014   Mild, related to stroke history   Nocturia    Renal insufficiency 06/25/2013   S/P carotid endarterectomy    BILATERAL ICA--  PATENT PER DUPLEX  05-19-2012  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/26/2017   Right calf   Urinary frequency    Vitamin D deficiency    Past Surgical History:  Past Surgical History:  Procedure Laterality Date   APPENDECTOMY  AS CHILD   CARDIOVASCULAR STRESS TEST  03-27-2012  DR CRENSHAW   LOW RISK LEXISCAN STUDY-- PROBABLE NORMAL PERFUSION AND SOFT  TISSUE ATTENUATION/  NO ISCHEMIA/ EF 51%   CAROTID ENDARTERECTOMY Bilateral LEFT  11-12-2008  DR GREG HAYES   RIGHT ICA  2006  ([facility])   CHOLECYSTECTOMY N/A 02/23/2022   Procedure: LAPAROSCOPIC CHOLECYSTECTOMY;  Surgeon: Quentin Ore, MD;  Location: [facility] OR;  Service: General;  Laterality: N/A;   CYSTOSCOPY W/ RETROGRADES Bilateral 06/22/2021   Procedure: CYSTOSCOPY WITH RETROGRADE PYELOGRAM;  Surgeon: Marcine Matar, MD;  Location: [facility];  Service: Urology;  Laterality: Bilateral;   CYSTOSCOPY WITH LITHOLAPAXY N/A 02/26/2013   Procedure: CYSTOSCOPY WITH LITHOLAPAXY;  Surgeon: Marcine Matar, MD;  Location: [facility];  Service: Urology;  Laterality: N/A;   ENDOSCOPIC RETROGRADE CHOLANGIOPANCREATOGRAPHY (ERCP) WITH PROPOFOL N/A 02/22/2022   Procedure: ENDOSCOPIC RETROGRADE CHOLANGIOPANCREATOGRAPHY (ERCP) WITH PROPOFOL;  Surgeon: Jeani Hawking, MD;  Location: [facility] END[facility]COPY;  Service: Gastroenterology;  Laterality: N/A;   EYE SURGERY  Jan. 2016   cataract surgery both eyes   INGUINAL HERNIA REPAIR Right 11-08-2006   IR KYPHO EA ADDL LEVEL THORACIC OR LUMBAR  02/12/2021   IR RADIOLOGIST EVAL & MGMT  02/18/2021   MASS EXCISION N/A 03/03/2016   Procedure: EXCISION OF BACK  MASS;  Surgeon: Almond Lint, MD;  Location: [facility];  Service: General;  Laterality: N/A;   MOHS SURGERY Left 1/ 2016   Dr Margo Aye-- Basal cell   PROSTATE SURGERY     REMOVAL OF STONES  02/22/2022   Procedure: REMOVAL OF STONES;  Surgeon: Jeani Hawking, MD;  Location: [facility] ENDOSCOPY;  Service: Gastroenterology;;   Dennison Mascot  02/22/2022   Procedure: Dennison Mascot;  Surgeon: Jeani Hawking, MD;  Location: [facility] ENDOSCOPY;  Service: Gastroenterology;;   TRANSURETHRAL RESECTION OF BLADDER TUMOR WITH MITOMYCIN-C N/A 06/22/2021   Procedure: TRANSURETHRAL RESECTION OF BLADDER TUMOR;  Surgeon: Marcine Matar, MD;  Location: [facility];  Service: Urology;   Laterality: N/A;   TRANSURETHRAL RESECTION OF PROSTATE N/A 02/26/2013   Procedure: TRANSURETHRAL RESECTION OF THE PROSTATE WITH GYRUS INSTRUMENTS;  Surgeon: Marcine Matar, MD;  Location: [facility];  Service: Urology;  Laterality: N/A;   TRANSURETHRAL RESECTION OF PROSTATE N/A 06/22/2021   Procedure: TRANSURETHRAL RESECTION OF THE PROSTATE (TURP);  Surgeon: Marcine Matar, MD;  Location: [facility];  Service: Urology;  Laterality: N/A;   HPI:  Patient is an 82 y.o. male who [facility] with confusion, dizziness, and right sided weakness. At baseline, patient reports left-sided facial droop. PMH is significant for  Parkinson's disease, vascular dementia, 2 strokes, CKD, hypertension, CAD and hyperlipidemia.   Assessment / Plan / Recommendation Clinical Impression  Patient seen by SLP for cognitive linguistic evaluation. He was awake and alert with his spouse at bedside when SLP entered the room. Patient and spouse reported that patient is functioning at his baseline with slightly increased confusion. At baseline, spouse endorsed deficits in attention, memory, safety, and processing. Patient appeared to be functioning at baseline as evidenced by components of [facility] Mental Status examination. He was orientated X4. Evaluation revealed impairments in memory, attention, and processing speed. Patient  recalled 3/5 objects in spaced retrieval task and named 8 objects in one minute during a divergent naming task. He demonstrated relative awareness of his deficits. His spouse reported that his OP speech therapist discharged him from services. He currently attends an adult day program three days a week. Neither the patient nor his spouse expressed interest in continued ST. SLP discussed contacting their PCP if further services are desired at anytime. Patient and spouse verbalized understanding. No further ST at this time.     SLP Assessment  SLP Recommendation/Assessment: Patient does not need any further Speech Lanaguage Pathology Services SLP Visit Diagnosis: Aphasia (R47.01);Cognitive communication deficit (R41.841)    Recommendations for follow up therapy are one component of a multi-disciplinary discharge planning process, led by the attending physician.  Recommendations may be updated based on patient status, additional functional criteria and insurance authorization.    Follow Up Recommendations  Other (comment) (Spouse reported patient preforming at baseline at this time. May request further evaluation from PCP if desired at discharge.)    Assistance Recommended at Discharge  Frequent or constant Supervision/Assistance  Functional Status Assessment Patient has not had a recent decline in their functional status  Frequency and Duration           SLP Evaluation Cognition  Overall Cognitive Status: History of cognitive impairments - at baseline Arousal/Alertness: Awake/alert Orientation Level: Oriented X4 Memory: Impaired Memory Impairment: Storage deficit;Retrieval deficit;Decreased recall of new information Awareness: Impaired Awareness Impairment: Emergent impairment Safety/Judgment: Impaired       Comprehension  Auditory Comprehension Overall Auditory Comprehension: Impaired at baseline Yes/No Questions: Not tested Commands: Impaired Two Step Basic Commands: 50-74% accurate Conversation: Simple Interfering Components: Attention;Processing speed Visual Recognition/Discrimination Discrimination: Not tested Reading Comprehension Reading Status: Not tested    Expression Expression Primary Mode of Expression: Verbal Verbal Expression Initiation: No impairment Automatic Speech: Name Level of Generative/Spontaneous Verbalization: Conversation Naming: Impairment Divergent: 50-74% accurate Pragmatics: No impairment Written Expression Written Expression: Not tested   Oral / Motor   Oral Motor/Sensory Function Overall Oral Motor/Sensory Function: Moderate impairment Motor Speech Overall Motor Speech: Impaired at baseline Phonation: Low vocal intensity Articulation: Within functional limitis            Marline Backbone, B.S., Speech Therapy Student   04/19/2023, 11:25 AM

## 2023-04-20 ENCOUNTER — Telehealth: Payer: Self-pay

## 2023-04-20 LAB — URINE CULTURE

## 2023-04-20 NOTE — Transitions of Care (Post Inpatient/ED Visit) (Signed)
04/20/2023  Name: William [name] MRN: [id number] DOB: 03/29/1941  Today's TOC FU Call Status: Today's TOC FU Call Status:: Successful TOC FU Call Completed TOC FU Call Complete Date: 04/20/23 Patient's Name and Date of Birth confirmed.  Transition Care Management Follow-up Telephone Call Date of Discharge: 04/19/23 Discharge Facility: [facility]) Type of Discharge: Inpatient Admission Primary Inpatient Discharge Diagnosis:: encephalopathy How have you been since you were released from the hospital?: Same Any questions or concerns?: No  Items Reviewed: Did you receive and understand the discharge instructions provided?: Yes Medications obtained,verified, and reconciled?: Yes (Medications Reviewed) Any new allergies since your discharge?: No Dietary orders reviewed?: Yes Do you have support at home?: Yes People in Home: spouse  Medications Reviewed Today: Medications Reviewed Today     Reviewed by Karena Addison, LPN (Licensed Practical Nurse) on 04/20/23 at 1302  Med List Status: <None>   Medication Order Taking? Sig Documenting Provider Last Dose Status Informant  acetaminophen (TYLENOL) 325 MG tablet [id number] No Take 2 tablets (650 mg total) by mouth every 6 (six) hours as needed for mild pain. Eric Form, PA-C Unk Active Spouse/Significant Other, Pharmacy Records  amantadine (SYMMETREL) 100 MG capsule [id number] No Take 1 capsule (100 mg total) by mouth daily. Seabron Spates R, DO 04/18/2023 Active Spouse/Significant Other, Pharmacy Records  amLODipine (NORVASC) 10 MG tablet [id number]  Take 1 tablet (10 mg total) by mouth every morning. Elgergawy, Leana Roe, MD  Active   carBAMazepine (TEGRETOL) 100 MG/5ML suspension [id number] No Take 5 mLs (100 mg total) by mouth in the morning and in the afternoon, and then 10 mLs (200 mg total) at bedtime. Archer Asa, MD 04/18/2023 Active Spouse/Significant Other, Pharmacy Records  carbidopa-levodopa (SINEMET IR) 25-100  MG tablet [id number] No Take 1.5 tablets by mouth 3 (three) times daily. Drema Dallas, DO 04/18/2023 Active Spouse/Significant Other, Pharmacy Records  cephALEXin (KEFLEX) 250 MG/5ML suspension [id number]  Take 5 mLs (250 mg total) by mouth 2 (two) times daily for 5 days. **Discard Remainder** Elgergawy, Leana Roe, MD  Active   Cholecalciferol (VITAMIN D3) 50 MCG (2000 UT) CHEW [id number] No Chew 1 tablet by mouth daily. [provider] Past Week Active Spouse/Significant Other, Pharmacy Records           Med Note Tobias Alexander Apr 18, 2023  2:56 PM)    clobetasol cream (TEMOVATE) 0.05 % [id number] No Apply 1 Application to affected areas topically up to 2 (two) times daily as needed. Do not apply to face, groin, or under arms Nita Sells, MD 04/17/2023 Active Spouse/Significant Other, Pharmacy Records  clopidogrel (PLAVIX) 75 MG tablet [id number] No Take 1 tablet (75 mg total) by mouth daily. Seabron Spates R, DO 04/18/2023 Active Spouse/Significant Other, Pharmacy Records  cyanocobalamin (VITAMIN B12) 1000 MCG tablet [id number]  Take 1 tablet (1,000 mcg total) by mouth daily. Elgergawy, Leana Roe, MD  Active   ferrous sulfate 325 (65 FE) MG EC tablet [id number] No Take 325 mg by mouth 2 (two) times daily. *Crushed* [provider] 04/17/2023 Active Spouse/Significant Other, Pharmacy Records           Med Note Tobias Alexander Apr 18, 2023  2:25 PM)    fluticasone (CUTIVATE) 0.05 % cream [id number] No 1 application  at bedtime as needed (psoriasis). [provider] Unk Active Spouse/Significant Other, Pharmacy Records  hydrALAZINE (APRESOLINE) 25 MG tablet [id number]  Take 1 tablet (25 mg total) by mouth 3 (  three) times daily. Elgergawy, Leana Roe, MD  Active   LORazepam (ATIVAN) 1 MG tablet [id number] No Take 1/4 tablet by mouth in the morning, 1/2 tablet by mouth at lunch and 1 tablet by mouth at bedtime Archer Asa, MD 04/18/2023 Active  Spouse/Significant Other, Pharmacy Records  Multiple Vitamins-Minerals (ADULT ONE DAILY GUMMIES) CHEW [id number] No Chew 1 tablet by mouth every morning. [provider] Unk Active Spouse/Significant Other, Pharmacy Records  pantoprazole (PROTONIX) 40 MG tablet [id number] No Take 1 tablet (40 mg total) by mouth daily. Seabron Spates R, DO 04/18/2023 Active Spouse/Significant Other, Pharmacy Records  polyethylene glycol (MIRALAX / GLYCOLAX) 17 g packet [id number] No Take 17 g by mouth daily.  Patient taking differently: Take 17 g by mouth every other day.   Pricilla Loveless, MD 04/17/2023 Active Spouse/Significant Other, Pharmacy Records           Med Note Tobias Alexander Apr 18, 2023  2:26 PM)    tamsulosin [facility]) 0.4 MG CAPS capsule [id number] No Take 1 capsule (0.4 mg total) by mouth daily after supper.  Patient taking differently: Take 0.4 mg by mouth at bedtime.   Seabron Spates R, DO 04/17/2023 Active Spouse/Significant Other, Pharmacy Records  traZODone (DESYREL) 150 MG tablet [id number] No Take 0.5 tablets (75 mg total) by mouth at bedtime. *CrushedArcher Asa, MD 04/17/2023 Active Spouse/Significant Other, Pharmacy Records  triamcinolone cream (KENALOG) 0.1 % [id number] No Apply 1 application topically 2 (two) times daily. [name], [name], DO Unk Active Spouse/Significant Other, Pharmacy Records            Home Care and Equipment/Supplies: Were Home Health Services Ordered?: NA Any new equipment or medical supplies ordered?: Yes Name of Medical supply agency?: unknown Were you able to get the equipment/medical supplies?: Yes Do you have any questions related to the use of the equipment/supplies?: No  Functional Questionnaire: Do you need assistance with bathing/showering or dressing?: Yes Do you need assistance with meal preparation?: Yes Do you need assistance with eating?: No Do you have difficulty maintaining continence: Yes Do you need  assistance with getting out of bed/getting out of a chair/moving?: No Do you have difficulty managing or taking your medications?: No  Follow up appointments reviewed: PCP Follow-up appointment confirmed?: Yes Date of PCP follow-up appointment?: 04/21/23 Follow-up Provider: [facility] Follow-up appointment confirmed?: NA Do you need transportation to your follow-up appointment?: No Do you understand care options if your condition(s) worsen?: Yes-patient verbalized understanding    SIGNATURE Karena Addison, LPN [facility] Nurse Health Advisor Direct Dial [phone]

## 2023-04-21 ENCOUNTER — Ambulatory Visit (INDEPENDENT_AMBULATORY_CARE_PROVIDER_SITE_OTHER): Payer: PPO | Admitting: Family Medicine

## 2023-04-21 ENCOUNTER — Other Ambulatory Visit (HOSPITAL_COMMUNITY): Payer: Self-pay

## 2023-04-21 ENCOUNTER — Encounter: Payer: Self-pay | Admitting: Family Medicine

## 2023-04-21 ENCOUNTER — Other Ambulatory Visit (HOSPITAL_BASED_OUTPATIENT_CLINIC_OR_DEPARTMENT_OTHER): Payer: Self-pay

## 2023-04-21 ENCOUNTER — Other Ambulatory Visit: Payer: Self-pay

## 2023-04-21 VITALS — BP 122/80 | HR 65 | Temp 98.1°F | Resp 18 | Ht 69.0 in

## 2023-04-21 DIAGNOSIS — N1832 Chronic kidney disease, stage 3b: Secondary | ICD-10-CM | POA: Diagnosis not present

## 2023-04-21 DIAGNOSIS — I63511 Cerebral infarction due to unspecified occlusion or stenosis of right middle cerebral artery: Secondary | ICD-10-CM

## 2023-04-21 DIAGNOSIS — F0390 Unspecified dementia without behavioral disturbance: Secondary | ICD-10-CM | POA: Diagnosis not present

## 2023-04-21 DIAGNOSIS — Z8673 Personal history of transient ischemic attack (TIA), and cerebral infarction without residual deficits: Secondary | ICD-10-CM | POA: Diagnosis not present

## 2023-04-21 DIAGNOSIS — N390 Urinary tract infection, site not specified: Secondary | ICD-10-CM

## 2023-04-21 DIAGNOSIS — G20A1 Parkinson's disease without dyskinesia, without mention of fluctuations: Secondary | ICD-10-CM | POA: Diagnosis not present

## 2023-04-21 DIAGNOSIS — I10 Essential (primary) hypertension: Secondary | ICD-10-CM

## 2023-04-21 DIAGNOSIS — N171 Acute kidney failure with acute cortical necrosis: Secondary | ICD-10-CM | POA: Diagnosis not present

## 2023-04-21 DIAGNOSIS — E785 Hyperlipidemia, unspecified: Secondary | ICD-10-CM

## 2023-04-21 MED ORDER — NONFORMULARY OR COMPOUNDED ITEM: 0 refills | Status: DC

## 2023-04-21 MED ORDER — CEPHALEXIN 250 MG PO CAPS
250.0000 mg | ORAL_CAPSULE | Freq: Two times a day (BID) | ORAL | 0 refills | Status: DC
Start: 2023-04-21 — End: 2023-06-16
  Filled 2023-04-21: qty 10, 5d supply, fill #0

## 2023-04-21 NOTE — Assessment & Plan Note (Signed)
Per neuro 

## 2023-04-21 NOTE — Assessment & Plan Note (Signed)
Encourage heart healthy diet such as MIND or DASH diet, increase exercise, avoid trans fats, simple carbohydrates and processed foods, consider a krill or fish or flaxseed oil cap daily.  °

## 2023-04-21 NOTE — Assessment & Plan Note (Signed)
PER NEURO

## 2023-04-21 NOTE — Progress Notes (Signed)
Established Patient Office Visit  Subjective   Patient ID: William [name], male    DOB: [birth year]  Age: 82 y.o. MRN: [id number]  Chief Complaint  Patient presents with   Hospitalization Follow-up    HPI Discussed the use of AI scribe software for clinical note transcription with the patient, who gave verbal consent to proceed.  History of Present Illness   The patient, with a history of dementia and Parkinson's disease, was recently admitted to the hospital following an episode of confusion and unresponsiveness. The patient was reportedly drooling excessively and was unable to be roused. The episode was initially suspected to be a stroke, prompting a stroke protocol to be initiated. However, subsequent investigations, including an EEG, ruled out stroke and seizure activity. The patient was found to have a vitamin B12 deficiency and a bladder infection. The patient's urine culture was contaminated, necessitating a repeat sample.  The patient has been experiencing episodes of confusion, which the caregiver retrospectively attributes to the bladder infection. The patient has also been complaining of a sensation of something being in his right ear, leading to difficulty hearing. However, upon examination, the ear was found to be clean with no obstructions.  During the hospital stay, the patient sustained a skin tear on his back during a transfer from the ER cart to the bed. The wound has been seeping, requiring daily dressing changes. The patient's caregiver has been managing this at home.  The patient's caregiver also reported that the patient has been experiencing episodes of hand-tapping, which have been keeping him awake at night. However, these episodes have not occurred for the past three nights, two of which the patient spent in the hospital.      Patient Active Problem List   Diagnosis Date Noted   Acute encephalopathy 04/18/2023   Parkinson's disease (HCC) 04/18/2023   History of  CVA with residual deficit 04/18/2023   Chronic kidney disease, stage 3b (HCC) 04/18/2023   Dementia with behavioral disturbance (HCC) 04/18/2023   Anemia of chronic disease 04/18/2023   Laceration of right upper extremity 02/24/2023   Dermatitis 02/24/2023   Lung nodules 02/24/2023   Dysuria 08/03/2022   Nonintractable headache 07/01/2022   Bilateral impacted cerumen 06/24/2022   Rash 06/15/2022   Postoperative ileus (HCC) 02/27/2022   Ileus, postoperative (HCC) 02/26/2022   Choledocholithiasis 02/19/2022   DNR (do not resuscitate) 02/19/2022   Left elbow pain 01/26/2022   Mid back pain on left side 01/26/2022   Rib pain 01/26/2022   Acute pain of left shoulder 11/12/2021   Leukocytes in urine 11/12/2021   Urinary frequency 11/12/2021   Thrush 10/08/2021   Hemiplegia, 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   Insomnia    Prediabetes    Acute 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 (HCC)    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   Transaminitis 07/27/2021   UTI (urinary tract infection) 07/27/2021   CVA (cerebral vascular accident) (HCC) 07/27/2021   Fall 07/27/2021   Hyperglycemia 07/27/2021   Cholelithiasis 07/27/2021   Hypoxia 07/27/2021   Nausea and vomiting 07/27/2021   Acute metabolic encephalopathy 07/27/2021   Normocytic anemia 07/27/2021   Chronic back pain 07/27/2021   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   Closed fracture of first lumbar vertebra with routine healing 02/03/2021   Closed fracture of multiple ribs 11/18/2020   Anxiety 01/29/2020   Leg pain, bilateral 01/29/2020   Ingrown toenail 07/13/2019   Lumbar spondylosis  05/02/2018   Pain in left knee 03/09/2018   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   Trochanteric bursitis of left hip 01/16/2018   Preventative health care 09/26/2017   HTN (hypertension) 07/19/2015   Hyperlipidemia 07/19/2015   Great toe pain 02/11/2014    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   Obesity (BMI 30-39.9) 06/25/2013   Renal insufficiency 06/25/2013   Weakness of left arm 06/25/2013   Sebaceous cyst 03/03/2011   Sprain of lumbar region 07/31/2010   Rib pain, left 08/29/2009   Carotid artery stenosis, asymptomatic, bilateral 05/02/2009   Eczema, atopic 05/31/2008   Vitamin D deficiency 03/01/2008   BPH (benign prostatic hyperplasia) 08/06/2007   Fasting hyperglycemia 12/21/2006   History of right MCA infarct 2006   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   HTN (hypertension) 07/19/2015   Hyperlipidemia    Major neurocognitive disorder 01/09/2014   Mild, related to stroke history   Nocturia    Renal insufficiency 06/25/2013   S/P carotid endarterectomy    BILATERAL ICA--  PATENT PER DUPLEX  05-19-2012   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   Right calf   Urinary frequency    Vitamin D deficiency    Past Surgical History:  Procedure Laterality Date   APPENDECTOMY  AS CHILD   CARDIOVASCULAR STRESS TEST  03-27-2012  DR CRENSHAW   LOW RISK LEXISCAN STUDY-- PROBABLE NORMAL PERFUSION AND SOFT TISSUE ATTENUATION/  NO ISCHEMIA/ EF 51%   CAROTID ENDARTERECTOMY Bilateral LEFT  11-12-2008  DR GREG HAYES   RIGHT ICA  2006  ([facility])   CHOLECYSTECTOMY N/A 02/23/2022   Procedure: LAPAROSCOPIC CHOLECYSTECTOMY;  Surgeon: Quentin Ore, MD;  Location: [facility] OR;  Service: General;  Laterality: N/A;   CYSTOSCOPY W/ RETROGRADES Bilateral 06/22/2021   Procedure: CYSTOSCOPY WITH RETROGRADE PYELOGRAM;   Surgeon: Marcine Matar, MD;  Location: [facility];  Service: Urology;  Laterality: Bilateral;   CYSTOSCOPY WITH LITHOLAPAXY N/A 02/26/2013   Procedure: CYSTOSCOPY WITH LITHOLAPAXY;  Surgeon: Marcine Matar, MD;  Location: [facility];  Service: Urology;  Laterality: N/A;   ENDOSCOPIC RETROGRADE CHOLANGIOPANCREATOGRAPHY (ERCP) WITH PROPOFOL N/A 02/22/2022   Procedure: ENDOSCOPIC RETROGRADE CHOLANGIOPANCREATOGRAPHY (ERCP) WITH PROPOFOL;  Surgeon: Jeani Hawking, MD;  Location: [facility] END[facility];  Service: Gastroenterology;  Laterality: N/A;   EYE SURGERY  Jan. 2016   cataract surgery both eyes   INGUINAL HERNIA REPAIR Right 11-08-2006   IR KYPHO EA ADDL LEVEL THORACIC OR LUMBAR  02/12/2021   IR RADIOLOGIST EVAL & MGMT  02/18/2021   MASS EXCISION N/A 03/03/2016   Procedure: EXCISION OF BACK  MASS;  Surgeon: Almond Lint, MD;  Location: [facility];  Service: General;  Laterality: N/A;   MOHS SURGERY Left 1/ 2016   Dr Margo Aye-- Basal cell   PROSTATE SURGERY     REMOVAL OF STONES  02/22/2022   Procedure: REMOVAL OF STONES;  Surgeon: Jeani Hawking, MD;  Location: [facility] ENDOSCOPY;  Service: Gastroenterology;;   Dennison Mascot  02/22/2022   Procedure: SPHINCTEROTOMY;  Surgeon: Jeani Hawking, MD;  Location: [facility] ENDOSCOPY;  Service: Gastroenterology;;   TRANSURETHRAL RESECTION OF BLADDER TUMOR WITH MITOMYCIN-C N/A 06/22/2021   Procedure: TRANSURETHRAL RESECTION OF BLADDER TUMOR;  Surgeon: Marcine Matar, MD;  Location: [facility];  Service: Urology;  Laterality: N/A;   TRANSURETHRAL RESECTION OF PROSTATE N/A 02/26/2013   Procedure: TRANSURETHRAL RESECTION OF THE PROSTATE WITH GYRUS INSTRUMENTS;  Surgeon: Marcine Matar, MD;  Location: [facility];  Service: Urology;  Laterality: N/A;   TRANSURETHRAL RESECTION OF PROSTATE N/A 06/22/2021   Procedure: TRANSURETHRAL RESECTION OF THE PROSTATE (TURP);  Surgeon: Marcine Matar, MD;  Location:  [facility];  Service: Urology;  Laterality: N/A;   Social History   Tobacco Use   Smoking status: Former    Current packs/day: 0.00    Average packs/day: 2.0 packs/day for 40.0 years (80.0 ttl pk-yrs)    Types: Cigarettes    Start date: 02/15/1965    Quit date: 02/15/2005    Years since quitting: 18.1   Smokeless tobacco: Never  Vaping Use   Vaping status: Never Used  Substance Use Topics   Alcohol use: Not Currently    Comment: Occasional   Drug use: No   Social History   Socioeconomic History   Marital status: Married    Spouse name: Not on file   Number of children: 2   Years of education: 12   Highest education level: 12th grade  Occupational History    Employer: Retired  Tobacco Use   Smoking status: Former    Current packs/day: 0.00    Average packs/day: 2.0 packs/day for 40.0 years (80.0 ttl pk-yrs)    Types: Cigarettes    Start date: 02/15/1965    Quit date: 02/15/2005    Years since quitting: 18.1   Smokeless tobacco: Never  Vaping Use   Vaping status: Never Used  Substance and Sexual Activity   Alcohol use: Not Currently    Comment: Occasional   Drug use: No   Sexual activity: Not Currently    Partners: Female  Other Topics Concern   Not on file  Social History Narrative   Exercise--  Walks with assistance -- using wheelchair more than the walker       LIves with wife , no stairs in home, caffeine - one cup coffee day, exercise - not much, Right handed, 12th grade, retired      One story home   Social Determinants of Health   Financial Resource Strain: Low Risk  (12/20/2022)   Overall Financial Resource Strain (CARDIA)    Difficulty of Paying Living Expenses: Not very hard  Food Insecurity: No Food Insecurity (04/19/2023)   Hunger Vital Sign    Worried About Running Out of Food in the Last Year: Never true    Ran Out of Food in the Last Year: Never true  Transportation Needs: No Transportation Needs (04/19/2023)   PRAPARE -  Administrator, Civil Service (Medical): No    Lack of Transportation (Non-Medical): No  Physical Activity: Insufficiently Active (03/08/2023)   Exercise Vital Sign    Days of Exercise per Week: 1 day    Minutes of Exercise per Session: 10 min  Stress: Stress Concern Present (03/08/2023)   [employer] of Occupational Health - Occupational Stress Questionnaire    Feeling of Stress : To some extent  Social Connections: Moderately Isolated (12/20/2022)   Social Connection and Isolation Panel [NHANES]    Frequency of Communication with Friends and  Family: More than three times a week    Frequency of Social Gatherings with Friends and Family: Never    Attends Religious Services: Never    Database administrator or Organizations: No    Attends Engineer, structural: Not on file    Marital Status: Married  Catering manager Violence: Not At Risk (04/19/2023)   Humiliation, Afraid, Rape, and Kick questionnaire    Fear of Current or Ex-Partner: No    Emotionally Abused: No    Physically Abused: No    Sexually Abused: No   Family Status  Relation Name Status   Mother  Deceased at age 18   Father  Deceased at age 40   Sister  Alive   Sister  Alive   Sister  Deceased  No partnership data on file   Family History  Problem Relation Age of Onset   Heart disease Mother        CHF   Bipolar disorder Mother    Heart disease Father        CHF   Allergies  Allergen Reactions   Bee Venom Anaphylaxis   Strawberry Extract Hives   Latex Itching   Zetia [Ezetimibe] Other (See Comments)    Intolerance    Adhesive [Tape] Other (See Comments)    blisters   Statins Other (See Comments)    myalgias      Review of Systems  Constitutional:  Negative for chills, fever and malaise/fatigue.  HENT:  Negative for congestion and hearing loss.   Eyes:  Negative for blurred vision and discharge.  Respiratory:  Negative for cough, sputum production and shortness of breath.    Cardiovascular:  Negative for chest pain, palpitations and leg swelling.  Gastrointestinal:  Negative for abdominal pain, blood in stool, constipation, diarrhea, heartburn, nausea and vomiting.  Genitourinary:  Negative for dysuria, frequency, hematuria and urgency.  Musculoskeletal:  Negative for back pain, falls and myalgias.  Skin:  Negative for rash.  Neurological:  Positive for weakness. Negative for dizziness, sensory change, loss of consciousness and headaches.  Endo/Heme/Allergies:  Negative for environmental allergies. Does not bruise/bleed easily.  Psychiatric/Behavioral:  Positive for hallucinations. Negative for depression and suicidal ideas. The patient is not nervous/anxious and does not have insomnia.      Objective:     BP 122/80 (BP Location: Left Arm, Patient Position: Sitting, Cuff Size: Normal)   Pulse 65   Temp 98.1 F (36.7 C) (Oral)   Resp 18   Ht 5\' 9"  (1.753 m)   SpO2 98%   BMI 24.61 kg/m  BP Readings from Last 3 Encounters:  04/21/23 122/80  04/19/23 (!) 123/101  03/04/23 132/82   Wt Readings from Last 3 Encounters:  04/18/23 166 lb 10.7 oz (75.6 kg)  02/24/23 161 lb 9.6 oz (73.3 kg)  01/17/23 171 lb 15.3 oz (78 kg)   SpO2 Readings from Last 3 Encounters:  04/21/23 98%  04/19/23 97%  03/04/23 99%      Physical Exam Vitals and nursing note reviewed.  Constitutional:      General: He is not in acute distress.    Appearance: Normal appearance. He is well-developed.  HENT:     Head: Normocephalic and atraumatic.  Eyes:     General: No scleral icterus.       Right eye: No discharge.        Left eye: No discharge.  Cardiovascular:     Rate and Rhythm: Normal rate and regular rhythm.  Heart sounds: No murmur heard. Pulmonary:     Effort: Pulmonary effort is normal. No respiratory distress.     Breath sounds: Normal breath sounds.  Musculoskeletal:     Cervical back: Normal range of motion and neck supple.     Right lower leg: No edema.      Left lower leg: No edema.  Skin:    General: Skin is warm and dry.     Findings: Wound present. No lesion.          Comments: SMALL SKIN TEAR---  CLEANED AREA AND BANDAGE PLACED ON IT   Neurological:     Mental Status: He is alert and oriented to person, place, and time.  Psychiatric:        Mood and Affect: Mood normal.        Behavior: Behavior normal.        Thought Content: Thought content normal.        Judgment: Judgment normal.     No results found for any visits on 04/21/23.  Last CBC Lab Results  Component Value Date   WBC 7.1 04/18/2023   HGB 13.3 04/18/2023   HCT 39.0 04/18/2023   MCV 99.0 04/18/2023   MCH 32.7 04/18/2023   RDW 13.3 04/18/2023   PLT 178 04/18/2023   Last metabolic panel Lab Results  Component Value Date   GLUCOSE 140 (H) 04/18/2023   NA 140 04/18/2023   K 4.2 04/18/2023   CL 105 04/18/2023   CO2 24 04/18/2023   BUN 26 (H) 04/18/2023   CREATININE 1.90 (H) 04/18/2023   GFRNONAA 37 (L) 04/18/2023   CALCIUM 9.2 04/18/2023   PHOS 3.4 03/03/2022   PROT 6.9 04/18/2023   ALBUMIN 3.8 04/18/2023   LABGLOB 3.2 04/25/2019   AGRATIO 1.3 04/25/2019   BILITOT 0.6 04/18/2023   ALKPHOS 140 (H) 04/18/2023   AST 15 04/18/2023   ALT 5 04/18/2023   ANIONGAP 12 04/18/2023   Last lipids Lab Results  Component Value Date   CHOL 178 04/18/2023   HDL 51 04/18/2023   LDLCALC 112 (H) 04/18/2023   TRIG 76 04/18/2023   CHOLHDL 3.5 04/18/2023   Last hemoglobin A1c Lab Results  Component Value Date   HGBA1C 4.9 04/18/2023   Last thyroid functions Lab Results  Component Value Date   TSH 0.53 02/24/2023   Last vitamin D Lab Results  Component Value Date   VD25OH 46.2 05/18/2022   Last vitamin B12 and Folate Lab Results  Component Value Date   VITAMINB12 203 04/19/2023   FOLATE 7.0 03/01/2022      The ASCVD Risk score (Arnett DK, et al., 2019) failed to calculate for the following reasons:   The 2019 ASCVD risk score is only valid  for ages 36 to 23   The patient has a prior MI or stroke diagnosis    Assessment & Plan:   Problem List Items Addressed This Visit       Unprioritized   UTI (urinary tract infection)   Relevant Orders   POCT Urinalysis Dipstick (Automated)   Ambulatory referral to Home Health   CVA (cerebral vascular accident) [facility]) - Primary   Relevant Orders   Comprehensive metabolic panel   CBC with Differential/Platelet   Ambulatory referral to Home Health   Parkinson's disease (HCC)    PER NEURO       Relevant Medications   NONFORMULARY OR COMPOUNDED ITEM   Other Relevant Orders   Comprehensive metabolic panel   CBC  with Differential/Platelet   Ambulatory referral to Home Health   Major vascular neurocognitive disorder    Per neuro      Relevant Orders   Comprehensive metabolic panel   CBC with Differential/Platelet   Ambulatory referral to Home Health   Hyperlipidemia    Encourage heart healthy diet such as MIND or DASH diet, increase exercise, avoid trans fats, simple carbohydrates and processed foods, consider a krill or fish or flaxseed oil cap daily.        HTN (hypertension)    Well controlled, no changes to meds. Encouraged heart healthy diet such as the DASH diet and exercise as tolerated.        Relevant Orders   Comprehensive metabolic panel   CBC with Differential/Platelet   Ambulatory referral to Home Health   Acute 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 (HCC)    PER NEPHROLOGY     Assessment and Plan    Dementia with Parkinson's Disease Dementia and Parkinson's disease present with confusion, inability to wake, drooling, and slurred speech, with stroke and seizure excluded via stroke protocol and EEG, indicating episodes are likely due to underlying conditions. We will continue current management for both conditions and monitor for further episodes, reporting any changes in symptoms.  Urinary Tract Infection (UTI) The individual has  a bladder infection. An initial urine culture was contaminated, requiring a repeat test. He experienced an allergic reaction to strawberry-flavored cephalexin suspension, prompting a switch to cephalexin capsules. We will repeat the urine culture using an in-and-out catheter if necessary, continue cephalexin capsules, administer Benadryl for allergic reactions as needed, and provide wipes for urine sample collection.  Vitamin B12 Deficiency A Vitamin B12 deficiency was identified during a recent hospitalization, contributing to neurological symptoms. We will administer vitamin B12 supplementation as prescribed.  Skin Tear on Back A skin tear on the back occurred during transfer from the ER cart to bed, with the wound seeping and requiring proper dressing. We will apply a larger dressing to the wound, change the dressing daily, keep the area clean and dry, and provide additional bandages for home use.  Hearing Loss Decreased hearing in the right ear was noted, with no obstruction or significant wax buildup found, suggesting progressive hearing loss related to age or underlying conditions. We will monitor hearing and consider an audiology referral if symptoms worsen.  Medication Management A change in lorazepam strength is required due to dosage discrepancies. We will adjust the lorazepam prescription to the correct strength, provide a signed DNR form for [facility], and write an order for 20 hours of custodial care through home health services.  Mobility Assistance He requires a transport chair for easier mobility. We will write a prescription for a transport chair and instruct on obtaining the chair from a medical supply place or through home health services.  Follow-up We will perform follow-up blood work and schedule follow-up appointments as needed.        Return if symptoms worsen or fail to improve, for AS SCHEDULED.    Donato Schultz, DO

## 2023-04-21 NOTE — Assessment & Plan Note (Signed)
Well controlled, no changes to meds. Encouraged heart healthy diet such as the DASH diet and exercise as tolerated.  °

## 2023-04-21 NOTE — Patient Instructions (Signed)
Parkinson's Disease Parkinson's disease causes problems with movement. It makes it harder for you to walk or control your body. Each person with the disease is affected differently. Treatments can help you manage your symptoms. Parkinson's disease can range from mild to very bad, but it gets worse over time. This often happens slowly over many years. What are the causes? Parkinson's disease is caused by a loss of brain cells called neurons. These brain cells make a substance called dopamine, which is needed to control body movement. It's not known what causes the brain cells to die. What increases the risk? Being male. Being 33 years of age or older. Having a family history of Parkinson's disease. Having an injury to the brain in the past. Being around things that are harmful or poisonous, such as pesticides. Having depression. This is when you feel sad or hopeless. What are the signs or symptoms? Symptoms can vary and get worse over time. The main symptoms can be seen in your movement. These include: Shaking or tremors that you can't control. This happens while you're resting. Stiffness in your arms and legs. Losing facial expressions. Walking in a way that isn't normal. You may walk with short, shuffling steps. Loss of balance when standing. You may sway, fall backward, or have trouble making turns. Other symptoms include: Being very sad, worried, or nervous. Having delusions. These are strong beliefs that aren't true. Having hallucinations. This is when you see, hear, taste, smell, or feel things that aren't real. Trouble speaking or swallowing. Trouble pooping (constipation). Needing to pee (urinate) right away, peeing often, or losing control of when you pee or poop. Sleep problems. How is this diagnosed? A diagnosis may be made based on symptoms, your medical history, and a physical exam. You may also have imaging tests that make pictures of your brain. How is this treated? There  is no cure for Parkinson's disease. The goal of treatment is to manage your symptoms. It may include: Medicines. Therapy to help with talking or movement. Surgery to reduce shaking and other movements that you can't control. Follow these instructions at home: Medicines Take your medicines only as told by your health care provider. Avoid taking pain or sleeping medicines. These can affect your thinking. Activity Ask your provider if it's safe for you to drive. Exercise as told by your provider or physical therapist. Lifestyle  Put grab bars and railings in your home, especially near the toilet and in the shower. These help prevent falls. Do not smoke, vape, or use products with nicotine or tobacco in them. If you need help quitting, talk with your provider. Do not drink alcohol. General instructions Talk with your provider to find out what kind of help you need at home. Ask about ways to stay safe. Join a support group for people with Parkinson's disease. Where to find more information [employer] of Neurological Disorders and Stroke: [employer] Parkinson's Foundation: parkinson.org Contact a health care provider if: Medicines don't help your symptoms. You have a lot of side effects from your medicines. You keep losing your balance or you fall. You need more help at home. You have trouble swallowing. You have a very hard time pooping. You feel very sad, worried, or confused. You see, hear, taste, smell, or feel things that aren't real. Get help right away if: You were hurt in a fall. You can't swallow without choking. You have chest pain or trouble breathing. You don't feel safe at home. These symptoms may be an emergency.  Call 911 right away. Do not wait to see if the symptoms will go away. Do not drive yourself to the hospital. Also, get help right away if: You feel like you may hurt yourself or others. You have thoughts about taking your own life. Take one of  these steps: Go to your nearest emergency room. Call 911. Call the National Suicide Prevention Lifeline at 316-735-8724 or 988. Text the Crisis Text Line at (304) 816-6368. This information is not intended to replace advice given to you by your health care provider. Make sure you discuss any questions you have with your health care provider. Document Revised: 08/16/2022 Document Reviewed: 08/16/2022 Elsevier Patient Education  2024 [employer].

## 2023-04-21 NOTE — Assessment & Plan Note (Signed)
PER NEPHROLOGY

## 2023-04-22 LAB — CBC WITH DIFFERENTIAL/PLATELET
Basophils Absolute: 0.1 10*3/uL (ref 0.0–0.1)
Basophils Relative: 1.1 % (ref 0.0–3.0)
Eosinophils Absolute: 0.2 10*3/uL (ref 0.0–0.7)
Eosinophils Relative: 2.7 % (ref 0.0–5.0)
HCT: 37.6 % — ABNORMAL LOW (ref 39.0–52.0)
Hemoglobin: 12.7 g/dL — ABNORMAL LOW (ref 13.0–17.0)
Lymphocytes Relative: 23.4 % (ref 12.0–46.0)
Lymphs Abs: 1.3 10*3/uL (ref 0.7–4.0)
MCHC: 33.9 g/dL (ref 30.0–36.0)
MCV: 98.9 fL (ref 78.0–100.0)
Monocytes Absolute: 0.5 10*3/uL (ref 0.1–1.0)
Monocytes Relative: 8.6 % (ref 3.0–12.0)
Neutro Abs: 3.5 10*3/uL (ref 1.4–7.7)
Neutrophils Relative %: 64.2 % (ref 43.0–77.0)
Platelets: 193 10*3/uL (ref 150.0–400.0)
RBC: 3.8 Mil/uL — ABNORMAL LOW (ref 4.22–5.81)
RDW: 14.1 % (ref 11.5–15.5)
WBC: 5.5 10*3/uL (ref 4.0–10.5)

## 2023-04-22 LAB — COMPREHENSIVE METABOLIC PANEL
ALT: 5 U/L (ref 0–53)
AST: 14 U/L (ref 0–37)
Albumin: 3.9 g/dL (ref 3.5–5.2)
Alkaline Phosphatase: 145 U/L — ABNORMAL HIGH (ref 39–117)
BUN: 26 mg/dL — ABNORMAL HIGH (ref 6–23)
CO2: 29 meq/L (ref 19–32)
Calcium: 8.8 mg/dL (ref 8.4–10.5)
Chloride: 103 meq/L (ref 96–112)
Creatinine, Ser: 1.87 mg/dL — ABNORMAL HIGH (ref 0.40–1.50)
GFR: 33.48 mL/min — ABNORMAL LOW (ref >60.00)
Glucose, Bld: 102 mg/dL — ABNORMAL HIGH (ref 70–99)
Potassium: 4.5 meq/L (ref 3.5–5.1)
Sodium: 140 meq/L (ref 135–145)
Total Bilirubin: 0.3 mg/dL (ref 0.2–1.2)
Total Protein: 6.6 g/dL (ref 6.0–8.3)

## 2023-04-24 ENCOUNTER — Ambulatory Visit
Admission: EM | Admit: 2023-04-24 | Discharge: 2023-04-24 | Disposition: A | Discharge status: Home / Self Care | Payer: PPO | Attending: Internal Medicine | Admitting: Internal Medicine

## 2023-04-24 DIAGNOSIS — T148XXA Other injury of unspecified body region, initial encounter: Secondary | ICD-10-CM | POA: Diagnosis not present

## 2023-04-24 MED ORDER — BACITRACIN ZINC 500 UNIT/GM EX OINT: 1.0000 | TOPICAL_OINTMENT | Freq: Two times a day (BID) | CUTANEOUS | 0 refills | Status: DC

## 2023-04-24 NOTE — ED Provider Notes (Signed)
[facility]  Note:  This document was prepared using Conservation officer, historic buildings and may include unintentional dictation errors.  MRN: [id number] DOB: 06/13/1941  Subjective:   William [name] is a 82 y.o. male presenting for 2-day history of persistent skin tears over the back and associated tenderness.  Patient was just discharged from the hospital 04/18/2023 for transient ischemic attack, acute encephalopathy.  His wife was tending to a wound and unfortunately some of the adhesives that were used, caused more skin tearing.  There has not been any erythema, drainage of pus.  She has tried to keep the areas clean and would like to make sure that there is no sign of infection.  No current facility-administered medications for this encounter.  Current Outpatient Medications:    acetaminophen (TYLENOL) 325 MG tablet, Take 2 tablets (650 mg total) by mouth every 6 (six) hours as needed for mild pain., Disp: , Rfl:    amantadine (SYMMETREL) 100 MG capsule, Take 1 capsule (100 mg total) by mouth daily., Disp: 90 capsule, Rfl: 1   amLODipine (NORVASC) 10 MG tablet, Take 1 tablet (10 mg total) by mouth every morning., Disp: , Rfl:    carBAMazepine (TEGRETOL) 100 MG/5ML suspension, Take 5 mLs (100 mg total) by mouth in the morning and in the afternoon, and then 10 mLs (200 mg total) at bedtime., Disp: 600 mL, Rfl: 3   carbidopa-levodopa (SINEMET IR) 25-100 MG tablet, Take 1.5 tablets by mouth 3 (three) times daily., Disp: 165 tablet, Rfl: 5   cephALEXin (KEFLEX) 250 MG capsule, Take 1 capsule (250 mg total) by mouth 2 (two) times daily for 5 days. Capsules may be opened and placed into food for dosing., Disp: 10 capsule, Rfl: 0   cephALEXin (KEFLEX) 250 MG/5ML suspension, Take 5 mLs (250 mg total) by mouth 2 (two) times daily for 5 days. **Discard Remainder**, Disp: 100 mL, Rfl: 0   Cholecalciferol (VITAMIN D3) 50 MCG (2000 UT) CHEW, Chew 1 tablet by mouth daily., Disp: ,  Rfl:    clobetasol cream (TEMOVATE) 0.05 %, Apply 1 Application to affected areas topically up to 2 (two) times daily as needed. Do not apply to face, groin, or under arms, Disp: 60 g, Rfl: 3   clopidogrel (PLAVIX) 75 MG tablet, Take 1 tablet (75 mg total) by mouth daily., Disp: 90 tablet, Rfl: 1   cyanocobalamin (VITAMIN B12) 1000 MCG tablet, Take 1 tablet (1,000 mcg total) by mouth daily., Disp: 30 tablet, Rfl: 0   ferrous sulfate 325 (65 FE) MG EC tablet, Take 325 mg by mouth 2 (two) times daily. *Crushed*, Disp: , Rfl:    fluticasone (CUTIVATE) 0.05 % cream, 1 application  at bedtime as needed (psoriasis)., Disp: , Rfl: 3   hydrALAZINE (APRESOLINE) 25 MG tablet, Take 1 tablet (25 mg total) by mouth 3 (three) times daily., Disp: , Rfl:    LORazepam (ATIVAN) 1 MG tablet, Take 1/4 tablet by mouth in the morning, 1/2 tablet by mouth at lunch and 1 tablet by mouth at bedtime, Disp: 60 tablet, Rfl: 2   Multiple Vitamins-Minerals (ADULT ONE DAILY GUMMIES) CHEW, Chew 1 tablet by mouth every morning., Disp: , Rfl:    NONFORMULARY OR COMPOUNDED ITEM, Transport chair  dx parkinsons, hx falls, Disp: 1 each, Rfl: 0   pantoprazole (PROTONIX) 40 MG tablet, Take 1 tablet (40 mg total) by mouth daily., Disp: 90 tablet, Rfl: 3   polyethylene glycol (MIRALAX / GLYCOLAX) 17 g packet, Take 17  g by mouth daily. (Patient taking differently: Take 17 g by mouth every other day.), Disp: 14 each, Rfl: 0   tamsulosin (FLOMAX) 0.4 MG CAPS capsule, Take 1 capsule (0.4 mg total) by mouth daily after supper. (Patient taking differently: Take 0.4 mg by mouth at bedtime.), Disp: 90 capsule, Rfl: 1   traZODone (DESYREL) 150 MG tablet, Take 0.5 tablets (75 mg total) by mouth at bedtime. *Crushed*, Disp: 20 tablet, Rfl: 6   triamcinolone cream (KENALOG) 0.1 %, Apply 1 application topically 2 (two) times daily., Disp: 30 g, Rfl: 0  Facility-Administered Medications Ordered in Other Encounters:    gemcitabine (GEMZAR) chemo syringe  for bladder instillation 2,000 mg, 2,000 mg, Bladder Instillation, Once, Dahlstedt, Stephen, MD   Allergies  Allergen Reactions   Bee Venom Anaphylaxis   Strawberry Extract Hives   Latex Itching   Zetia [Ezetimibe] Other (See Comments)    Intolerance    Adhesive [Tape] Other (See Comments)    blisters   Statins Other (See Comments)    myalgias    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   HTN (hypertension) 07/19/2015   Hyperlipidemia    Major neurocognitive disorder 01/09/2014   Mild, related to stroke history   Nocturia    Renal insufficiency 06/25/2013   S/P carotid endarterectomy    BILATERAL ICA--  PATENT PER DUPLEX  05-19-2012   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   Right calf   Urinary frequency    Vitamin D deficiency      Past Surgical History:  Procedure Laterality Date   APPENDECTOMY  AS CHILD   CARDIOVASCULAR STRESS TEST  03-27-2012  DR CRENSHAW   LOW RISK LEXISCAN STUDY-- PROBABLE NORMAL PERFUSION AND SOFT TISSUE ATTENUATION/  NO ISCHEMIA/ EF 51%   CAROTID ENDARTERECTOMY Bilateral LEFT  11-12-2008  DR GREG HAYES   RIGHT ICA  2006  ([facility])   CHOLECYSTECTOMY N/A 02/23/2022   Procedure: LAPAROSCOPIC CHOLECYSTECTOMY;  Surgeon: Quentin Ore, MD;  Location: [facility] OR;  Service: General;  Laterality: N/A;   CYSTOSCOPY W/ RETROGRADES Bilateral 06/22/2021   Procedure: CYSTOSCOPY WITH RETROGRADE PYELOGRAM;  Surgeon: Marcine Matar, MD;  Location: [facility];  Service: Urology;  Laterality: Bilateral;   CYSTOSCOPY WITH  LITHOLAPAXY N/A 02/26/2013   Procedure: CYSTOSCOPY WITH LITHOLAPAXY;  Surgeon: Marcine Matar, MD;  Location: [facility];  Service: Urology;  Laterality: N/A;   ENDOSCOPIC RETROGRADE CHOLANGIOPANCREATOGRAPHY (ERCP) WITH PROPOFOL N/A 02/22/2022   Procedure: ENDOSCOPIC RETROGRADE CHOLANGIOPANCREATOGRAPHY (ERCP) WITH PROPOFOL;  Surgeon: Jeani Hawking, MD;  Location: [facility] ENDOSCOPY;  Service: Gastroenterology;  Laterality: N/A;   EYE SURGERY  Jan. 2016   cataract surgery both eyes   INGUINAL HERNIA REPAIR Right 11-08-2006   IR KYPHO EA ADDL LEVEL THORACIC OR LUMBAR  02/12/2021   IR RADIOLOGIST EVAL & MGMT  02/18/2021   MASS EXCISION N/A 03/03/2016   Procedure: EXCISION OF BACK  MASS;  Surgeon: Almond Lint, MD;  Location:  SURGERY CENTER;  Service: General;  Laterality:  N/A;   MOHS SURGERY Left 1/ 2016   Dr Margo Aye-- Basal cell   PROSTATE SURGERY     REMOVAL OF STONES  02/22/2022   Procedure: REMOVAL OF STONES;  Surgeon: Jeani Hawking, MD;  Location: [facility] ENDOSCOPY;  Service: Gastroenterology;;   Dennison Mascot  02/22/2022   Procedure: Dennison Mascot;  Surgeon: Jeani Hawking, MD;  Location: [facility];  Service: Gastroenterology;;   TRANSURETHRAL RESECTION OF BLADDER TUMOR WITH MITOMYCIN-C N/A 06/22/2021   Procedure: TRANSURETHRAL RESECTION OF BLADDER TUMOR;  Surgeon: Marcine Matar, MD;  Location: [facility];  Service: Urology;  Laterality: N/A;   TRANSURETHRAL RESECTION OF PROSTATE N/A 02/26/2013   Procedure: TRANSURETHRAL RESECTION OF THE PROSTATE WITH GYRUS INSTRUMENTS;  Surgeon: Marcine Matar, MD;  Location: [facility];  Service: Urology;  Laterality: N/A;   TRANSURETHRAL RESECTION OF PROSTATE N/A 06/22/2021   Procedure: TRANSURETHRAL RESECTION OF THE PROSTATE (TURP);  Surgeon: Marcine Matar, MD;  Location: [facility];  Service: Urology;  Laterality: N/A;    Family History  Problem Relation Age of Onset   Heart  disease Mother        CHF   Bipolar disorder Mother    Heart disease Father        CHF    Social History   Tobacco Use   Smoking status: Former    Current packs/day: 0.00    Average packs/day: 2.0 packs/day for 40.0 years (80.0 ttl pk-yrs)    Types: Cigarettes    Start date: 02/15/1965    Quit date: 02/15/2005    Years since quitting: 18.1   Smokeless tobacco: Never  Vaping Use   Vaping status: Never Used  Substance Use Topics   Alcohol use: Not Currently    Comment: Occasional   Drug use: No    ROS   Objective:   Vitals: BP (!) 155/77 (BP Location: Right Arm)   Pulse 61   Temp (!) 97.4 F (36.3 C) (Oral)   Resp 16   SpO2 95%   Physical Exam Constitutional:      General: He is not in acute distress.    Appearance: Normal appearance. He is well-developed and normal weight. He is not ill-appearing, toxic-appearing or diaphoretic.  HENT:     Head: Normocephalic and atraumatic.     Right Ear: External ear normal.     Left Ear: External ear normal.     Nose: Nose normal.     Mouth/Throat:     Pharynx: Oropharynx is clear.  Eyes:     General: No scleral icterus.       Right eye: No discharge.        Left eye: No discharge.     Extraocular Movements: Extraocular movements intact.  Cardiovascular:     Rate and Rhythm: Normal rate.  Pulmonary:     Effort: Pulmonary effort is normal.  Musculoskeletal:     Cervical back: Normal range of motion.  Skin:      Neurological:     Mental Status: He is alert and oriented to person, place, and time.  Psychiatric:        Mood and Affect: Mood normal.        Behavior: Behavior normal.        Thought Content: Thought content normal.        Judgment: Judgment normal.    Bacitracin applied to the wounds and covered with nonadherent dressing pads, secured with white paper tape.  Assessment and Plan :   PDMP  not reviewed this encounter.  1. Multiple skin tears    No suspicion for secondary infection.  Recommended  general wound care, use of bacitracin.  Placed a referral for follow-up with the wound care clinic.  Counseled patient on potential for adverse effects with medications prescribed/recommended today, ER and return-to-clinic precautions discussed, patient verbalized understanding.    Wallis Bamberg, PA-C 04/24/23 1345

## 2023-04-24 NOTE — Discharge Instructions (Signed)
Change your dressing 2-3 times daily. Every time you change your dressing, clean the wound gently with warm water and Dial antibacterial soap. Pat the wound dry, let it breathe for roughly an hour before covering it back up. When you reapply a dressing, apply Bacitracin ointment to the wound, then cover with non-stick/non-adherent gauze.  Follow up with the wound care clinic as soon as possible.

## 2023-04-24 NOTE — ED Triage Notes (Signed)
Wife reports pt suffered wound tear to back while being transported from stretcher to bed during recent hospitalization- was rechecked 2 days ago by PCP-feels area is worse and beeding after she removed bandaid today-pt to tx area in own w/c-pt with hx of dementia

## 2023-04-25 ENCOUNTER — Telehealth: Payer: Self-pay | Admitting: Family Medicine

## 2023-04-25 NOTE — Telephone Encounter (Signed)
[name] (spouse DPR OK) called stating, due to pt incontinence, she is unable to collect a urine specimen and was wondering how she could go about checking for his bladder infection because of this.

## 2023-04-26 ENCOUNTER — Ambulatory Visit: Payer: Self-pay | Admitting: Licensed Clinical Social Worker

## 2023-04-26 ENCOUNTER — Encounter: Payer: Self-pay | Admitting: Family Medicine

## 2023-04-26 NOTE — Patient Outreach (Signed)
  Care Coordination   Follow Up Visit Note   04/26/2023 Name: William [name] MRN: [id number] DOB: 1940/11/29  William [name] is a 82 y.o. year old male who sees [name], [name], DO for primary care. I spoke with  [name] / [name], spouse via  phone today.  What matters to the patients health and wellness today?  Patient needs help with ADLs    Goals Addressed             This Visit's Progress    Patient needs help with ADLs and with daily funtioning       Interventions:  Spoke with [name], spouse of client, via phone about status of client [name] reported that client has been going to physical therapy sessions as scheduled . [name] said client is scheduled for one physical therapy session later this week [name] helps client with ADLs. Client is at risk for falls Discussed  client participation [name].  He goes to that program 3 days per week [name] feels that day program at [facility] is very helpful to [name]. At program, [name] participates in social activities, trivia, games [name]  is very supportive of client. Discussed mood of client. Client sees [name], psychiatrist for mental health support. Client takes Ativan as prescribed. [name] said client has occasional behavioral issues, anger issues Discussed pain issues of client. Discussed sleeping issues of client. Discussed ambulation of client. Client has some challenges in walking . Client does have  wheelchair to use as needed Discussed daily care needs of client Client likes to relax by watching sports events on TV (football) LCSW thanked [name] for phone call with LCSW Encouraged [name] to call LCSW as needed to further discuss SW support for client at phone number [phone].           SDOH assessments and interventions completed:  Yes  SDOH Interventions Today    Flowsheet Row Most Recent Value  SDOH Interventions   Physical Activity  Interventions Other (Comments)  [client is receiving physical therapy sessions as scheduled]  Stress Interventions Other (Comment)  [has mobility issues. has stress in managing medical needs]        Care Coordination Interventions:  Yes, provided  Interventions Today    Flowsheet Row Most Recent Value  Chronic Disease   Chronic disease during today's visit Other  [spoke with spouse of client about client needs]  General Interventions   General Interventions Discussed/Reviewed General Interventions Discussed, Community Resources  Exercise Interventions   Exercise Discussed/Reviewed Physical Activity  Education Interventions   Education Provided Provided Education  Provided Verbal Education On Community Resources  Mental Health Interventions   Mental Health Discussed/Reviewed Coping Strategies  [has occasional behavioral issues]  Nutrition Interventions   Nutrition Discussed/Reviewed Nutrition Discussed  Pharmacy Interventions   Pharmacy Dicussed/Reviewed Pharmacy Topics Discussed  Safety Interventions   Safety Discussed/Reviewed Fall Risk        Follow up plan: Follow up call scheduled for 06/06/23 at 3:30 PM     Encounter Outcome:  Patient Visit Completed   Kelton Pillar.Burna Atlas MSW, LCSW Licensed Visual merchandiser [facility] Care Management [phone]

## 2023-04-26 NOTE — Patient Instructions (Signed)
Visit Information  Thank you for taking time to visit with me today. Please don't hesitate to contact me if I can be of assistance to you.   Following are the goals we discussed today:   Goals Addressed             This Visit's Progress    Patient needs help with ADLs and with daily funtioning       Interventions:  Spoke with William [name], spouse of client, via phone about status of client William [name] reported that client has been going to physical therapy sessions as scheduled . William [name] said client is scheduled for one physical therapy session later this week William [name] helps client with ADLs. Client is at risk for falls Discussed  client participation in [facility]  He goes to that program 3 days per week William [name] feels that day program at [facility] is very helpful to William [name]. At program, Jailin participates in social activities, trivia, games William [name]  is very supportive of client. Discussed mood of client. Client sees [name], psychiatrist for mental health support. Client takes Ativan as prescribed. William [name] said client has occasional behavioral issues, anger issues Discussed pain issues of client. Discussed sleeping issues of client. Discussed ambulation of client. Client has some challenges in walking . Client does have  wheelchair to use as needed Discussed daily care needs of client Client likes to relax by watching sports events on TV (football) LCSW thanked Phuoc Pellicano for phone call with LCSW Encouraged William [name] to call LCSW as needed to further discuss SW support for client at phone number [phone].           Our next appointment is by telephone on 06/06/23 at 3:30 PM   Please call the care guide team at [phone] if you need to cancel or reschedule your appointment.   If you are experiencing a Mental Health or Behavioral Health Crisis or need someone to talk to, please go to [facility] [address],  [place] [phone])   The patient / spouse of patient, verbalized understanding of instructions, educational materials, and care plan provided today and DECLINED offer to receive copy of patient instructions, educational materials, and care plan.   The patient/ spouse of patient,  has been provided with contact information for the care management team and has been advised to call with any health related questions or concerns.   Kelton Pillar.Taggert Bozzi MSW, LCSW Licensed Visual merchandiser [facility] Care Management [phone]

## 2023-04-27 NOTE — Telephone Encounter (Signed)
Per Selena Batten we do not have in and out caths. Should we send him downstairs?

## 2023-04-28 ENCOUNTER — Ambulatory Visit: Payer: PPO | Attending: Family Medicine | Admitting: Physical Therapy

## 2023-04-29 NOTE — Telephone Encounter (Signed)
Spoke with pt's wife. They do not have home health anymore. She states pt is complaining of burning and acting "wild". I advised her that patient may need to be cath since he is unable to urinate w/o a diaper. I advised since pt is still have sxs to take him to the ED downstairs to attempt cath. Lowne verbally notified.

## 2023-04-30 ENCOUNTER — Encounter (HOSPITAL_BASED_OUTPATIENT_CLINIC_OR_DEPARTMENT_OTHER): Payer: Self-pay

## 2023-04-30 ENCOUNTER — Emergency Department (HOSPITAL_BASED_OUTPATIENT_CLINIC_OR_DEPARTMENT_OTHER)
Admission: EM | Admit: 2023-04-30 | Discharge: 2023-04-30 | Disposition: A | Discharge status: Home / Self Care | Payer: PPO | Attending: Emergency Medicine | Admitting: Emergency Medicine

## 2023-04-30 ENCOUNTER — Other Ambulatory Visit: Payer: Self-pay

## 2023-04-30 DIAGNOSIS — F0390 Unspecified dementia without behavioral disturbance: Secondary | ICD-10-CM | POA: Diagnosis not present

## 2023-04-30 DIAGNOSIS — Z7901 Long term (current) use of anticoagulants: Secondary | ICD-10-CM | POA: Insufficient documentation

## 2023-04-30 DIAGNOSIS — Z91040 Latex allergy status: Secondary | ICD-10-CM | POA: Diagnosis not present

## 2023-04-30 DIAGNOSIS — N189 Chronic kidney disease, unspecified: Secondary | ICD-10-CM | POA: Diagnosis not present

## 2023-04-30 DIAGNOSIS — R410 Disorientation, unspecified: Secondary | ICD-10-CM | POA: Diagnosis not present

## 2023-04-30 LAB — URINALYSIS, ROUTINE W REFLEX MICROSCOPIC
Bilirubin Urine: NEGATIVE
Glucose, UA: NEGATIVE mg/dL
Ketones, ur: NEGATIVE mg/dL
Leukocytes,Ua: NEGATIVE
Nitrite: NEGATIVE
Protein, ur: 100 mg/dL — AB
Specific Gravity, Urine: 1.02 (ref 1.005–1.030)
pH: 6.5 (ref 5.0–8.0)

## 2023-04-30 LAB — URINALYSIS, MICROSCOPIC (REFLEX)

## 2023-04-30 NOTE — ED Notes (Signed)
Hold on blood work per Provider at this time.

## 2023-04-30 NOTE — Discharge Instructions (Signed)
William [name] was seen in the emergency department for confusion He does not have a urinary tract infection today It is important that you follow-up with your primary care doctor for reevaluation to discuss his confusion, medication management and further testing Return to the emergency department for worsening confusion or any other concerns

## 2023-04-30 NOTE — ED Provider Notes (Signed)
[facility] Provider Note   CSN: [id number] Arrival date & time: 04/30/23  1302     History  Chief Complaint  Patient presents with   Dysuria    William [name] is a 82 y.o. male.  With a history of dementia, CVA with hemiplegia and CKD who presents to the ED for dysuria.  Patient was recently seen in the ED and treated as outpatient for UTI with course of Keflex which she completed on 1110.  Daughter and wife voiced concern for confusion Moree UTI has returned.  Patient did complain of dysuria at home during the last couple days.  No fevers chills falls, new focal weakness or other concerns at this time.   Dysuria Presenting symptoms: dysuria        Home Medications Prior to Admission medications   Medication Sig Start Date End Date Taking? Authorizing Provider  acetaminophen (TYLENOL) 325 MG tablet Take 2 tablets (650 mg total) by mouth every 6 (six) hours as needed for mild pain. 02/24/22   Eric Form, PA-C  amantadine (SYMMETREL) 100 MG capsule Take 1 capsule (100 mg total) by mouth daily. 03/21/23   Seabron Spates R, DO  amLODipine (NORVASC) 10 MG tablet Take 1 tablet (10 mg total) by mouth every morning. 04/20/23   Elgergawy, Leana Roe, MD  bacitracin ointment Apply 1 Application topically 2 (two) times daily. 04/24/23   Wallis Bamberg, PA-C  carBAMazepine (TEGRETOL) 100 MG/5ML suspension Take 5 mLs (100 mg total) by mouth in the morning and in the afternoon, and then 10 mLs (200 mg total) at bedtime. 03/29/23   Plovsky, Earvin Hansen, MD  carbidopa-levodopa (SINEMET IR) 25-100 MG tablet Take 1.5 tablets by mouth 3 (three) times daily. 03/04/23   Drema Dallas, DO  cephALEXin (KEFLEX) 250 MG capsule Take 1 capsule (250 mg total) by mouth 2 (two) times daily for 5 days. Capsules may be opened and placed into food for dosing. 04/21/23   Ghimire, Werner Lean, MD  Cholecalciferol (VITAMIN D3) 50 MCG (2000 UT) CHEW Chew 1 tablet by mouth  daily.    [provider]  clobetasol cream (TEMOVATE) 0.05 % Apply 1 Application to affected areas topically up to 2 (two) times daily as needed. Do not apply to face, groin, or under arms 11/02/22   Nita Sells, MD  clopidogrel (PLAVIX) 75 MG tablet Take 1 tablet (75 mg total) by mouth daily. 03/14/23   Donato Schultz, DO  cyanocobalamin (VITAMIN B12) 1000 MCG tablet Take 1 tablet (1,000 mcg total) by mouth daily. 04/19/23   Elgergawy, Leana Roe, MD  ferrous sulfate 325 (65 FE) MG EC tablet Take 325 mg by mouth 2 (two) times daily. *Crushed*    [provider]  fluticasone (CUTIVATE) 0.05 % cream 1 application  at bedtime as needed (psoriasis). 12/16/16   [provider]  hydrALAZINE (APRESOLINE) 25 MG tablet Take 1 tablet (25 mg total) by mouth 3 (three) times daily. 04/20/23 10/20/23  Elgergawy, Leana Roe, MD  LORazepam (ATIVAN) 1 MG tablet Take 1/4 tablet by mouth in the morning, 1/2 tablet by mouth at lunch and 1 tablet by mouth at bedtime 04/04/23   Plovsky, Earvin Hansen, MD  Multiple Vitamins-Minerals (ADULT ONE DAILY GUMMIES) CHEW Chew 1 tablet by mouth every morning.    [provider]  NONFORMULARY OR COMPOUNDED ITEM Transport chair  dx parkinsons, hx falls 04/21/23   [name], Myrene Buddy R, DO  pantoprazole (PROTONIX) 40 MG  tablet Take 1 tablet (40 mg total) by mouth daily. 11/15/22   Donato Schultz, DO  polyethylene glycol (MIRALAX / GLYCOLAX) 17 g packet Take 17 g by mouth daily. Patient taking differently: Take 17 g by mouth every other day. 10/02/21   Pricilla Loveless, MD  tamsulosin (FLOMAX) 0.4 MG CAPS capsule Take 1 capsule (0.4 mg total) by mouth daily after supper. Patient taking differently: Take 0.4 mg by mouth at bedtime. 04/11/23   Donato Schultz, DO  traZODone (DESYREL) 150 MG tablet Take 0.5 tablets (75 mg total) by mouth at bedtime. *Crushed* 03/29/23   Plovsky, Earvin Hansen, MD  triamcinolone cream (KENALOG) 0.1 % Apply 1 application topically  2 (two) times daily. 11/15/22   Donato Schultz, DO      Allergies    Bee venom, Strawberry extract, Latex, Zetia [ezetimibe], Adhesive [tape], and Statins    Review of Systems   Review of Systems  Genitourinary:  Positive for dysuria.    Physical Exam Updated Vital Signs BP (!) 147/77 (BP Location: Right Arm)   Pulse 61   Temp 98.5 F (36.9 C) (Oral)   Resp 18   Ht 5\' 9"  (1.753 m)   Wt 75 kg   SpO2 100%   BMI 24.42 kg/m  Physical Exam Vitals and nursing note reviewed.  HENT:     Head: Normocephalic and atraumatic.  Eyes:     Pupils: Pupils are equal, round, and reactive to light.  Cardiovascular:     Rate and Rhythm: Normal rate and regular rhythm.  Pulmonary:     Effort: Pulmonary effort is normal.     Breath sounds: Normal breath sounds.  Abdominal:     Palpations: Abdomen is soft.     Tenderness: There is no abdominal tenderness.  Skin:    General: Skin is warm and dry.  Neurological:     General: No focal deficit present.     Mental Status: He is alert.     Comments: Oriented to self only  Psychiatric:        Mood and Affect: Mood normal.     ED Results / Procedures / Treatments   Labs (all labs ordered are listed, but only abnormal results are displayed) Labs Reviewed  URINALYSIS, ROUTINE W REFLEX MICROSCOPIC - Abnormal; Notable for the following components:      Result Value   Hgb urine dipstick TRACE (*)    Protein, ur 100 (*)    All other components within normal limits  URINALYSIS, MICROSCOPIC (REFLEX) - Abnormal; Notable for the following components:   Bacteria, UA RARE (*)    All other components within normal limits  BASIC METABOLIC PANEL  CBC WITH DIFFERENTIAL/PLATELET    EKG None  Radiology No results found.  Procedures Procedures    Medications Ordered in ED Medications - No data to display  ED Course/ Medical Decision Making/ A&P Clinical Course as of 04/30/23 1455  Sat Apr 30, 2023  1452 No evidence of urinary tract  infection on UA.  Daughter and wife at bedside feel comfortable with plan for discharge home and will follow-up with a primary care doctor early next week for reevaluation.  The note patient to be slightly confused still but is close to his baseline.  Stable for discharge [MP]    Clinical Course User Index [MP] Royanne Foots, DO  Medical Decision Making 82 year old male with history as above recently treated and completed outpatient antibiotics for UTI.  Returns today for persistent confusion.  No new focal neurologic deficits.  No other systemic complaints at this time.  Benign physical exam.  Will obtain urinalysis here to evaluate need for new course of antibiotics.  Considering completion of outpatient antibiotics he will likely require IV antibiotics admission for refractory UTI.  Plan to catheterize for urine.  In the event he does have another UTI we will obtain basic blood work at that time to evaluate for systemic infection.  Amount and/or Complexity of Data Reviewed Labs: ordered.           Final Clinical Impression(s) / ED Diagnoses Final diagnoses:  Confusion    Rx / DC Orders ED Discharge Orders     None         Royanne Foots, DO 04/30/23 1456

## 2023-04-30 NOTE — ED Triage Notes (Signed)
The patient is having painful urination.

## 2023-05-02 ENCOUNTER — Telehealth: Payer: Self-pay | Admitting: Family Medicine

## 2023-05-02 ENCOUNTER — Telehealth: Payer: Self-pay | Admitting: *Deleted

## 2023-05-02 NOTE — Telephone Encounter (Signed)
Drenda Freeze with [name] called to request a referral to be sent to them. She stated that this is per the wife's request. Please call and advise at [phone] . fax #: [phone].

## 2023-05-02 NOTE — Transitions of Care (Post Inpatient/ED Visit) (Signed)
   05/02/2023  Name: TERREL DRISKEL MRN: [id number] DOB: [birth year]  Today's TOC FU Call Status: Today's TOC FU Call Status:: Unsuccessful Call (1st Attempt) Unsuccessful Call (1st Attempt) Date: 05/02/23  Attempted to reach the patient regarding the most recent Inpatient/ED visit.  Follow Up Plan: Additional outreach attempts will be made to reach the patient to complete the Transitions of Care (Post Inpatient/ED visit) call.   Signature Tremond Shimabukuro, [facility]

## 2023-05-03 ENCOUNTER — Ambulatory Visit: Payer: Self-pay | Admitting: Licensed Clinical Social Worker

## 2023-05-03 ENCOUNTER — Encounter: Payer: Self-pay | Admitting: Family Medicine

## 2023-05-03 ENCOUNTER — Ambulatory Visit (HOSPITAL_COMMUNITY): Payer: PPO | Admitting: Psychiatry

## 2023-05-03 ENCOUNTER — Other Ambulatory Visit: Payer: Self-pay | Admitting: Family Medicine

## 2023-05-03 DIAGNOSIS — I129 Hypertensive chronic kidney disease with stage 1 through stage 4 chronic kidney disease, or unspecified chronic kidney disease: Secondary | ICD-10-CM | POA: Diagnosis not present

## 2023-05-03 DIAGNOSIS — N183 Chronic kidney disease, stage 3 unspecified: Secondary | ICD-10-CM | POA: Diagnosis not present

## 2023-05-03 DIAGNOSIS — F0390 Unspecified dementia without behavioral disturbance: Secondary | ICD-10-CM | POA: Diagnosis not present

## 2023-05-03 DIAGNOSIS — E559 Vitamin D deficiency, unspecified: Secondary | ICD-10-CM | POA: Diagnosis not present

## 2023-05-03 DIAGNOSIS — I63511 Cerebral infarction due to unspecified occlusion or stenosis of right middle cerebral artery: Secondary | ICD-10-CM

## 2023-05-03 DIAGNOSIS — G20A1 Parkinson's disease without dyskinesia, without mention of fluctuations: Secondary | ICD-10-CM

## 2023-05-03 DIAGNOSIS — N400 Enlarged prostate without lower urinary tract symptoms: Secondary | ICD-10-CM | POA: Diagnosis not present

## 2023-05-03 DIAGNOSIS — D631 Anemia in chronic kidney disease: Secondary | ICD-10-CM | POA: Diagnosis not present

## 2023-05-03 NOTE — Patient Instructions (Signed)
Visit Information  Thank you for taking time to visit with me today. Please don't hesitate to contact me if I can be of assistance to you.   Following are the goals we discussed today:   Goals Addressed             This Visit's Progress    Patient needs help with ADLs and with daily funtioning       Interventions:  Spoke with [name], spouse of client, via phone about status of client [name] said she had a recent fall with fracture and is not able to care for client at this time. She and LCSW spoke of care facilities in area where she could consider getting temporary help to help with needs of client LCSW discussed [address], [place], [place], [facility] as places she might look into for consideration for help with needs of client LCSW talked with [name] about FL-2 completion and that PCP for client could complete FL-2 for client and Fl-2 cold be used to seek temporary placement for client at appropriate care facility Discussed insurance of client Discussed that Fl-2 was good for 30 day period and that they should make copies of Fl-2 to take to potential facilities that may help with care needs of client. Randel Books LCSW name and phone number and encouraged her to call LCSW as needed for SW support for client.          LCSW to call client or spouse of client as scheduled to assess client needs   Please call the care guide team at [phone] if you need to cancel or reschedule your appointment.   If you are experiencing a Mental Health or Behavioral Health Crisis or need someone to talk to, please go to [facility] [address] [place] [phone])   The patient / spouse of patient, verbalized understanding of instructions, educational materials, and care plan provided today and DECLINED offer to receive copy of patient instructions, educational materials, and care plan.   The patient/ spouse of patient,  has  been provided with contact information for the care management team and has been advised to call with any health related questions or concerns.   Kelton Pillar.Marielis Samara MSW, LCSW Licensed Visual merchandiser [facility] Care Management [phone]

## 2023-05-03 NOTE — Telephone Encounter (Signed)
error 

## 2023-05-03 NOTE — Patient Outreach (Signed)
  Care Coordination   Follow Up Visit Note   05/03/2023 Name: William [name] MRN: [id number] DOB: 06-19-1940  William [name] is a 82 y.o. year old male who sees [name], [name], DO for primary care. I spoke with  [name] / [name] , spouse of client, via phone today.  What matters to the patients health and wellness today?  Patient needs help with ADLs and with daily functioning      Goals Addressed             This Visit's Progress    Patient needs help with ADLs and with daily funtioning       Interventions:  Spoke with [name], spouse of client, via phone about status of client [name] said she had a recent fall with fracture and is not able to care for client at this time. She and LCSW spoke of care facilities in area where she could consider getting temporary help to help with needs of client LCSW discussed [address], [place], [place], [facility] as places she might look into for consideration for help with needs of client LCSW talked with [name] about FL-2 completion and that PCP for client could complete FL-2 for client and Fl-2 cold be used to seek temporary placement for client at appropriate care facility Discussed insurance of client Discussed that Fl-2 was good for 30 day period and that they should make copies of Fl-2 to take to potential facilities that may help with care needs of client. Randel Books LCSW name and phone number and encouraged her to call LCSW as needed for SW support for client.           SDOH assessments and interventions completed:  Yes  SDOH Interventions Today    Flowsheet Row Most Recent Value  SDOH Interventions   Physical Activity Interventions Other (Comments)  [has some mobility issues]  Stress Interventions Other (Comment)  [has stress in managing medical needs]        Care Coordination Interventions:  Yes, provided   Interventions Today    Flowsheet Row Most Recent Value  Chronic  Disease   Chronic disease during today's visit Other  [spoke with spouse of client about client needs]  General Interventions   General Interventions Discussed/Reviewed General Interventions Discussed, Community Resources  Exercise Interventions   Exercise Discussed/Reviewed Physical Activity  Education Interventions   Education Provided Provided Education  Provided Verbal Education On Community Resources       Follow up plan: LCSW to call client or spouse of client as scheduled to assess client needs   Encounter Outcome:  Patient Visit Completed   Kelton Pillar.Stellarose Cerny MSW, LCSW Licensed Visual merchandiser [facility] Care Management [phone]

## 2023-05-04 ENCOUNTER — Telehealth: Payer: Self-pay | Admitting: *Deleted

## 2023-05-04 NOTE — Transitions of Care (Post Inpatient/ED Visit) (Signed)
   05/04/2023  Name: William [name] MRN: [id number] DOB: [birth year]  Today's TOC FU Call Status: Today's TOC FU Call Status:: Unsuccessful Call (2nd Attempt) Unsuccessful Call (2nd Attempt) Date: 05/04/23  Attempted to reach the patient regarding the most recent Inpatient/ED visit.  Follow Up Plan: Additional outreach attempts will be made to reach the patient to complete the Transitions of Care (Post Inpatient/ED visit) call.   Signature Aviona Martenson, [facility]

## 2023-05-05 ENCOUNTER — Other Ambulatory Visit: Payer: Self-pay

## 2023-05-05 ENCOUNTER — Ambulatory Visit: Payer: PPO

## 2023-05-05 ENCOUNTER — Other Ambulatory Visit (HOSPITAL_BASED_OUTPATIENT_CLINIC_OR_DEPARTMENT_OTHER): Payer: Self-pay

## 2023-05-06 ENCOUNTER — Other Ambulatory Visit: Payer: Self-pay | Admitting: Family Medicine

## 2023-05-06 ENCOUNTER — Encounter: Payer: Self-pay | Admitting: Family Medicine

## 2023-05-06 NOTE — Addendum Note (Signed)
Addended by: Seabron Spates R on: 05/06/2023 03:47 PM   Modules accepted: Orders

## 2023-05-08 ENCOUNTER — Other Ambulatory Visit: Payer: Self-pay

## 2023-05-09 ENCOUNTER — Other Ambulatory Visit: Payer: Self-pay

## 2023-05-10 ENCOUNTER — Emergency Department (HOSPITAL_COMMUNITY): Payer: PPO

## 2023-05-10 ENCOUNTER — Other Ambulatory Visit: Payer: Self-pay

## 2023-05-10 ENCOUNTER — Encounter (HOSPITAL_COMMUNITY): Payer: Self-pay

## 2023-05-10 ENCOUNTER — Ambulatory Visit (HOSPITAL_COMMUNITY): Payer: PPO | Admitting: Psychiatry

## 2023-05-10 ENCOUNTER — Ambulatory Visit: Payer: PPO

## 2023-05-10 ENCOUNTER — Emergency Department (HOSPITAL_COMMUNITY)
Admission: EM | Admit: 2023-05-10 | Discharge: 2023-05-10 | Disposition: A | Discharge status: Home / Self Care | Payer: PPO | Attending: Emergency Medicine | Admitting: Emergency Medicine

## 2023-05-10 DIAGNOSIS — I10 Essential (primary) hypertension: Secondary | ICD-10-CM | POA: Diagnosis not present

## 2023-05-10 DIAGNOSIS — R55 Syncope and collapse: Secondary | ICD-10-CM | POA: Diagnosis not present

## 2023-05-10 DIAGNOSIS — Z91040 Latex allergy status: Secondary | ICD-10-CM | POA: Diagnosis not present

## 2023-05-10 DIAGNOSIS — F0390 Unspecified dementia without behavioral disturbance: Secondary | ICD-10-CM | POA: Diagnosis not present

## 2023-05-10 DIAGNOSIS — Z8551 Personal history of malignant neoplasm of bladder: Secondary | ICD-10-CM | POA: Diagnosis not present

## 2023-05-10 DIAGNOSIS — I6782 Cerebral ischemia: Secondary | ICD-10-CM | POA: Diagnosis not present

## 2023-05-10 DIAGNOSIS — Z79899 Other long term (current) drug therapy: Secondary | ICD-10-CM | POA: Insufficient documentation

## 2023-05-10 DIAGNOSIS — R531 Weakness: Secondary | ICD-10-CM | POA: Insufficient documentation

## 2023-05-10 LAB — URINALYSIS, ROUTINE W REFLEX MICROSCOPIC
Bilirubin Urine: NEGATIVE
Glucose, UA: NEGATIVE mg/dL
Hgb urine dipstick: NEGATIVE
Ketones, ur: NEGATIVE mg/dL
Leukocytes,Ua: NEGATIVE
Nitrite: NEGATIVE
Protein, ur: 30 mg/dL — AB
Specific Gravity, Urine: 1.01 (ref 1.005–1.030)
pH: 7 (ref 5.0–8.0)

## 2023-05-10 LAB — CBC WITH DIFFERENTIAL/PLATELET
Abs Immature Granulocytes: 0.01 10*3/uL (ref 0.00–0.07)
Basophils Absolute: 0 10*3/uL (ref 0.0–0.1)
Basophils Relative: 1 %
Eosinophils Absolute: 0 10*3/uL (ref 0.0–0.5)
Eosinophils Relative: 1 %
HCT: 36.1 % — ABNORMAL LOW (ref 39.0–52.0)
Hemoglobin: 12.1 g/dL — ABNORMAL LOW (ref 13.0–17.0)
Immature Granulocytes: 0 %
Lymphocytes Relative: 12 %
Lymphs Abs: 0.8 10*3/uL (ref 0.7–4.0)
MCH: 33.4 pg (ref 26.0–34.0)
MCHC: 33.5 g/dL (ref 30.0–36.0)
MCV: 99.7 fL (ref 80.0–100.0)
Monocytes Absolute: 0.4 10*3/uL (ref 0.1–1.0)
Monocytes Relative: 6 %
Neutro Abs: 5.4 10*3/uL (ref 1.7–7.7)
Neutrophils Relative %: 80 %
Platelets: 149 10*3/uL — ABNORMAL LOW (ref 150–400)
RBC: 3.62 MIL/uL — ABNORMAL LOW (ref 4.22–5.81)
RDW: 13.2 % (ref 11.5–15.5)
WBC: 6.7 10*3/uL (ref 4.0–10.5)
nRBC: 0 % (ref 0.0–0.2)

## 2023-05-10 LAB — BASIC METABOLIC PANEL
Anion gap: 9 (ref 5–15)
BUN: 24 mg/dL — ABNORMAL HIGH (ref 8–23)
CO2: 22 mmol/L (ref 22–32)
Calcium: 8.7 mg/dL — ABNORMAL LOW (ref 8.9–10.3)
Chloride: 103 mmol/L (ref 98–111)
Creatinine, Ser: 1.88 mg/dL — ABNORMAL HIGH (ref 0.61–1.24)
GFR, Estimated: 36 mL/min — ABNORMAL LOW (ref >60)
Glucose, Bld: 117 mg/dL — ABNORMAL HIGH (ref 70–99)
Potassium: 4.7 mmol/L (ref 3.5–5.1)
Sodium: 134 mmol/L — ABNORMAL LOW (ref 135–145)

## 2023-05-10 LAB — HEPATIC FUNCTION PANEL
ALT: <5 U/L (ref 0–44)
AST: 17 U/L (ref 15–41)
Albumin: 3.4 g/dL — ABNORMAL LOW (ref 3.5–5.0)
Alkaline Phosphatase: 111 U/L (ref 38–126)
Bilirubin, Direct: <0.1 mg/dL (ref 0.0–0.2)
Total Bilirubin: 0.6 mg/dL (ref <1.2)
Total Protein: 6 g/dL — ABNORMAL LOW (ref 6.5–8.1)

## 2023-05-10 LAB — TROPONIN I (HIGH SENSITIVITY)
Troponin I (High Sensitivity): 10 ng/L (ref <18)
Troponin I (High Sensitivity): 10 ng/L (ref <18)

## 2023-05-10 LAB — MAGNESIUM: Magnesium: 1.9 mg/dL (ref 1.7–2.4)

## 2023-05-10 MED ORDER — LACTATED RINGERS IV BOLUS
1000.0000 mL | Freq: Once | INTRAVENOUS | Status: AC
  Administered 2023-05-10: 1000 mL via INTRAVENOUS

## 2023-05-10 NOTE — ED Provider Notes (Signed)
[place] [facility] AT [facility] Provider Note   CSN: [id number] Arrival date & time: 05/10/23  1156     History  Chief Complaint  Patient presents with   Near Syncope    William [name] is a 82 y.o. male.   Near Syncope  Patient presents syncopal episode.  Medical history includes carotid artery stenosis, CVA, HTN, HLD, arthritis, anxiety, bladder cancer, dementia, GERD.  He had a recent admission for strokelike symptoms 3 weeks ago.  MRI of the brain was negative.  EEG did not show seizure activity.  He was found to have UTI and this was treated.  He was also found to have low B12.  He is currently on B12 supplement.  He has chronic left-sided deficits from prior stroke.  He remains on Plavix.  This morning, he seemed to be in his normal state of health.  While he was eating breakfast, he was seated at the table in a wheelchair.  He had a witnessed syncopal episode by family.  Family describes him becoming unresponsive, letting out a loud groaning, and then not responding for approximately 30 seconds.  After that, he was in and out of consciousness for another minute.  After that, he seemed confused and not his normal self.  He is now currently back to baseline.  He has undergone a recent 50% dose increase in his Sinemet.  He has not had any other recent medication changes.  Patient, himself, denies any current complaints.     Home Medications Prior to Admission medications   Medication Sig Start Date End Date Taking? Authorizing Provider  acetaminophen (TYLENOL) 325 MG tablet Take 2 tablets (650 mg total) by mouth every 6 (six) hours as needed for mild pain. 02/24/22   Eric Form, PA-C  amantadine (SYMMETREL) 100 MG capsule Take 1 capsule (100 mg total) by mouth daily. 03/21/23   Seabron Spates R, DO  amLODipine (NORVASC) 10 MG tablet Take 1 tablet (10 mg total) by mouth every morning. 04/20/23   Elgergawy, Leana Roe, MD  bacitracin ointment Apply 1  Application topically 2 (two) times daily. 04/24/23   Wallis Bamberg, PA-C  carBAMazepine (TEGRETOL) 100 MG/5ML suspension Take 5 mLs (100 mg total) by mouth in the morning and in the afternoon, and then 10 mLs (200 mg total) at bedtime. 03/29/23   Plovsky, Earvin Hansen, MD  carbidopa-levodopa (SINEMET IR) 25-100 MG tablet Take 1.5 tablets by mouth 3 (three) times daily. 03/04/23   Drema Dallas, DO  cephALEXin (KEFLEX) 250 MG capsule Take 1 capsule (250 mg total) by mouth 2 (two) times daily for 5 days. Capsules may be opened and placed into food for dosing. 04/21/23   Ghimire, Werner Lean, MD  Cholecalciferol (VITAMIN D3) 50 MCG (2000 UT) CHEW Chew 1 tablet by mouth daily.    [provider]  clobetasol cream (TEMOVATE) 0.05 % Apply 1 Application to affected areas topically up to 2 (two) times daily as needed. Do not apply to face, groin, or under arms 11/02/22   Nita Sells, MD  clopidogrel (PLAVIX) 75 MG tablet Take 1 tablet (75 mg total) by mouth daily. 03/14/23   Donato Schultz, DO  cyanocobalamin (VITAMIN B12) 1000 MCG tablet Take 1 tablet (1,000 mcg total) by mouth daily. 04/19/23   Elgergawy, Leana Roe, MD  ferrous sulfate 325 (65 FE) MG EC tablet Take 325 mg by mouth 2 (two) times daily. *Crushed*    [provider]  fluticasone (CUTIVATE)  0.05 % cream 1 application  at bedtime as needed (psoriasis). 12/16/16   [provider]  hydrALAZINE (APRESOLINE) 25 MG tablet Take 1 tablet (25 mg total) by mouth 3 (three) times daily. 04/20/23 10/20/23  Elgergawy, Leana Roe, MD  LORazepam (ATIVAN) 1 MG tablet Take 1/4 tablet by mouth in the morning, 1/2 tablet by mouth at lunch and 1 tablet by mouth at bedtime 04/04/23   Plovsky, Earvin Hansen, MD  Multiple Vitamins-Minerals (ADULT ONE DAILY GUMMIES) CHEW Chew 1 tablet by mouth every morning.    [provider]  NONFORMULARY OR COMPOUNDED ITEM Transport chair  dx parkinsons, hx falls 04/21/23   [name], [name], DO  pantoprazole  (PROTONIX) 40 MG tablet Take 1 tablet (40 mg total) by mouth daily. 11/15/22   Donato Schultz, DO  polyethylene glycol (MIRALAX / GLYCOLAX) 17 g packet Take 17 g by mouth daily. Patient taking differently: Take 17 g by mouth every other day. 10/02/21   Pricilla Loveless, MD  tamsulosin (FLOMAX) 0.4 MG CAPS capsule Take 1 capsule (0.4 mg total) by mouth daily after supper. Patient taking differently: Take 0.4 mg by mouth at bedtime. 04/11/23   Donato Schultz, DO  traZODone (DESYREL) 150 MG tablet Take 0.5 tablets (75 mg total) by mouth at bedtime. *Crushed* 03/29/23   Plovsky, Earvin Hansen, MD  triamcinolone cream (KENALOG) 0.1 % Apply 1 application topically 2 (two) times daily. 11/15/22   Donato Schultz, DO      Allergies    Bee venom, Strawberry extract, Latex, Zetia [ezetimibe], Adhesive [tape], and Statins    Review of Systems   Review of Systems  Unable to perform ROS: Dementia  Cardiovascular:  Positive for near-syncope.  Neurological:  Positive for syncope.    Physical Exam Updated Vital Signs BP 139/67 (BP Location: Right Arm)   Pulse 85   Temp 97.7 F (36.5 C) (Oral)   Resp 16   Ht 5\' 9"  (1.753 m)   Wt 75 kg   SpO2 99%   BMI 24.42 kg/m  Physical Exam Vitals and nursing note reviewed.  Constitutional:      General: He is not in acute distress.    Appearance: Normal appearance. He is well-developed. He is not ill-appearing, toxic-appearing or diaphoretic.  HENT:     Head: Normocephalic and atraumatic.     Right Ear: External ear normal.     Left Ear: External ear normal.     Nose: Nose normal.     Mouth/Throat:     Mouth: Mucous membranes are moist.  Eyes:     Extraocular Movements: Extraocular movements intact.     Conjunctiva/sclera: Conjunctivae normal.  Cardiovascular:     Rate and Rhythm: Normal rate and regular rhythm.     Heart sounds: No murmur heard. Pulmonary:     Effort: Pulmonary effort is normal. No respiratory distress.     Breath  sounds: Normal breath sounds. No wheezing or rales.  Chest:     Chest wall: No tenderness.  Abdominal:     General: There is no distension.     Palpations: Abdomen is soft.     Tenderness: There is no abdominal tenderness.  Musculoskeletal:        General: No swelling. Normal range of motion.     Cervical back: Normal range of motion and neck supple.     Right lower leg: No edema.     Left lower leg: No edema.  Skin:    General:  Skin is warm and dry.     Coloration: Skin is not jaundiced or pale.  Neurological:     Mental Status: He is alert. Mental status is at baseline.     Motor: Weakness (Baseline chronic left hemibody weakness) present.  Psychiatric:        Mood and Affect: Mood normal.        Behavior: Behavior normal.     ED Results / Procedures / Treatments   Labs (all labs ordered are listed, but only abnormal results are displayed) Labs Reviewed  BASIC METABOLIC PANEL - Abnormal; Notable for the following components:      Result Value   Sodium 134 (*)    Glucose, Bld 117 (*)    BUN 24 (*)    Creatinine, Ser 1.88 (*)    Calcium 8.7 (*)    GFR, Estimated 36 (*)    All other components within normal limits  CBC WITH DIFFERENTIAL/PLATELET - Abnormal; Notable for the following components:   RBC 3.62 (*)    Hemoglobin 12.1 (*)    HCT 36.1 (*)    Platelets 149 (*)    All other components within normal limits  URINALYSIS, ROUTINE W REFLEX MICROSCOPIC - Abnormal; Notable for the following components:   Protein, ur 30 (*)    Bacteria, UA FEW (*)    All other components within normal limits  HEPATIC FUNCTION PANEL - Abnormal; Notable for the following components:   Total Protein 6.0 (*)    Albumin 3.4 (*)    All other components within normal limits  MAGNESIUM  TROPONIN I (HIGH SENSITIVITY)  TROPONIN I (HIGH SENSITIVITY)    EKG EKG Interpretation Date/Time:  Tuesday May 10 2023 16:24:36 EST Ventricular Rate:  67 PR Interval:  196 QRS Duration:  114 QT  Interval:  448 QTC Calculation: 473 R Axis:   -42  Text Interpretation: Sinus rhythm Borderline IVCD with LAD Low voltage, precordial leads Abnormal R-wave progression, early transition Confirmed by Virgina Norfolk [phone]) on 05/10/2023 4:27:29 PM  Radiology DG Chest Port 1 View  Result Date: 05/10/2023 CLINICAL DATA:  Syncope EXAM: PORTABLE CHEST 1 VIEW COMPARISON:  X-ray 04/18/2023. FINDINGS: No consolidation, pneumothorax or effusion. No edema. Normal cardiopericardial silhouette. Overlapping cardiac leads. Degenerative changes along the spine. Kyphotic x-ray obscures the apices. IMPRESSION: No acute cardiopulmonary disease. Electronically Signed   By: Karen Kays M.D.   On: 05/10/2023 14:09   CT HEAD WO CONTRAST  Result Date: 05/10/2023 CLINICAL DATA:  Syncope/presyncope, cerebrovascular cause suspected. EXAM: CT HEAD WITHOUT CONTRAST TECHNIQUE: Contiguous axial images were obtained from the base of the skull through the vertex without intravenous contrast. RADIATION DOSE REDUCTION: This exam was performed according to the departmental dose-optimization program which includes automated exposure control, adjustment of the mA and/or kV according to patient size and/or use of iterative reconstruction technique. COMPARISON:  Head CT 04/18/2023 and MRI 04/19/2023 FINDINGS: Brain: There is no evidence of an acute infarct, intracranial hemorrhage, mass, midline shift, or extra-axial fluid collection. Large chronic right MCA infarcts are again noted. There is a background of moderate chronic small vessel ischemia with a chronic left basal ganglia infarct again noted as well. There is mild global cerebral atrophy. Vascular: Calcified atherosclerosis at the skull base. No hyperdense vessel. Skull: No acute fracture or suspicious osseous lesion. Sinuses/Orbits: Paranasal sinuses and mastoid air cells are clear. Bilateral cataract extraction. Other: None. IMPRESSION: 1. No evidence of acute intracranial  abnormality. 2. Chronic ischemia including large chronic right MCA infarct.  Electronically Signed   By: Sebastian Ache M.D.   On: 05/10/2023 13:16    Procedures Procedures    Medications Ordered in ED Medications  lactated ringers bolus 1,000 mL (0 mLs Intravenous Stopped 05/10/23 1421)    ED Course/ Medical Decision Making/ A&P                                 Medical Decision Making Amount and/or Complexity of Data Reviewed Labs: ordered. Radiology: ordered. ECG/medicine tests: ordered.   This patient presents to the ED for concern of syncope, this involves an extensive number of treatment options, and is a complaint that carries with it a high risk of complications and morbidity.  The differential diagnosis includes vasovagal episode, arrhythmia, seizure, TIA, dehydration, infection, metabolic derangements   Co morbidities that complicate the patient evaluation  carotid artery stenosis, CVA, HTN, HLD, arthritis, anxiety, bladder cancer, dementia, GERD   Additional history obtained:  Additional history obtained from patient's wife and son External records from outside source obtained and reviewed including EMR   Lab Tests:  I Ordered, and personally interpreted labs.  The pertinent results include: Baseline creatinine, normal electrolytes, normal hemoglobin, no leukocytosis, normal troponin   Imaging Studies ordered:  I ordered imaging studies including chest x-ray, CT head I independently visualized and interpreted imaging which showed no acute findings I agree with the radiologist interpretation   Cardiac Monitoring: / EKG:  The patient was maintained on a cardiac monitor.  I personally viewed and interpreted the cardiac monitored which showed an underlying rhythm of: Sinus rhythm  Problem List / ED Course / Critical interventions / Medication management  Patient presents for syncopal episode.  This was witnessed by family this morning.  On arrival in the ED, he  is awake and alert.  Family reports that he is currently at his mental baseline.  Vital signs are currently normal.  EMS reported normal CBG prior to arrival.  On exam, he is well-appearing.  Breathing is unlabored.  No cardiac rubs or murmurs are appreciated on auscultation.  SpO2 is normal on room air.  He is able to identify his family members.  He has left hemibody deficits which family confirm are chronic.  He has no new focal neurologic deficits.  Patient was placed on bedside cardiac monitor.  Workup was initiated.  Lab results are reassuring.  No acute findings identified on chest x-ray or CT head.  Patient remained in normal sinus rhythm while in the ED.  He remained alert and asymptomatic while in the ED.  I had a shared decision-making discussion with wife and son.  Patient was offered admission for observation and echocardiogram, which it does not appear that he underwent during his recent admission.  Alternatively, given patient's reassuring workup and absence of any symptoms while in the ED, outpatient follow-up would be appropriate as well.  Patient, wife, and son are in favor of discharge with outpatient follow-up.  Cardiology referral was ordered.  Patient was discharged in stable condition. I ordered medication including IV fluids for hydration Reevaluation of the patient after these medicines showed that the patient improved I have reviewed the patients home medicines and have made adjustments as needed   Social Determinants of Health:  Lives at home with family        Final Clinical Impression(s) / ED Diagnoses Final diagnoses:  Syncope, unspecified syncope type    Rx / DC Orders ED  Discharge Orders          Ordered    Ambulatory referral to Cardiology       Comments: If you have not heard from the Cardiology office within the next 72 hours please call [phone].   05/10/23 1821              Gloris Manchester, MD 05/10/23 [phone]

## 2023-05-10 NOTE — ED Notes (Signed)
Trinna Post, RN dressed patient. Removed condom cath and iv.

## 2023-05-10 NOTE — ED Triage Notes (Signed)
Pt present to ED from home with noted syncopal noted for approximately 15-30 seconds times two episodes. Pt has hx of stroke.   EMS VS: 150/72 56 18 98%room air 137 cbg

## 2023-05-10 NOTE — Discharge Instructions (Signed)
Test results today are reassuring.  You should follow-up with a cardiologist.  They can offer further testing, including ultrasound of the heart and wearable cardiac monitoring.  A referral has been sent.  If you do not hear from the cardiology office in the next couple days, call the telephone number below.  Return to the emergency department for any new or worsening symptoms of concern.

## 2023-05-15 ENCOUNTER — Encounter: Payer: Self-pay | Admitting: Family Medicine

## 2023-05-16 ENCOUNTER — Other Ambulatory Visit: Payer: Self-pay

## 2023-05-16 ENCOUNTER — Other Ambulatory Visit (HOSPITAL_BASED_OUTPATIENT_CLINIC_OR_DEPARTMENT_OTHER): Payer: Self-pay

## 2023-05-16 MED ORDER — VITAMIN B-12 1000 MCG PO TABS
1000.0000 ug | ORAL_TABLET | Freq: Every day | ORAL | 2 refills | Status: DC
  Filled 2023-05-16: qty 100, 100d supply, fill #0
  Filled 2023-08-15: qty 100, 100d supply, fill #1
  Filled 2023-11-21 (×2): qty 100, 100d supply, fill #2

## 2023-05-17 ENCOUNTER — Ambulatory Visit: Payer: PPO | Attending: Family Medicine | Admitting: Physical Therapy

## 2023-05-17 ENCOUNTER — Encounter: Payer: Self-pay | Admitting: Physical Therapy

## 2023-05-17 DIAGNOSIS — I69354 Hemiplegia and hemiparesis following cerebral infarction affecting left non-dominant side: Secondary | ICD-10-CM | POA: Insufficient documentation

## 2023-05-17 DIAGNOSIS — Z9181 History of falling: Secondary | ICD-10-CM | POA: Diagnosis not present

## 2023-05-17 DIAGNOSIS — R2689 Other abnormalities of gait and mobility: Secondary | ICD-10-CM | POA: Insufficient documentation

## 2023-05-17 DIAGNOSIS — R296 Repeated falls: Secondary | ICD-10-CM | POA: Diagnosis not present

## 2023-05-17 DIAGNOSIS — M6281 Muscle weakness (generalized): Secondary | ICD-10-CM | POA: Insufficient documentation

## 2023-05-17 DIAGNOSIS — R262 Difficulty in walking, not elsewhere classified: Secondary | ICD-10-CM | POA: Diagnosis not present

## 2023-05-17 NOTE — Therapy (Signed)
OUTPATIENT PHYSICAL THERAPY NEURO TREATMENT  Patient Name: William [name] MRN: [id number] DOB:06-11-1941, 82 y.o., male Today's Date: 05/17/2023  PCP: Donato Schultz  DO REFERRING PROVIDER: same   END OF SESSION:  PT End of Session - 05/17/23 1500     Visit Number 59    Number of Visits 66    Date for PT Re-Evaluation 06/17/23    Authorization Type HTA    PT Start Time 1445    PT Stop Time 1530    PT Time Calculation (min) 45 min    Equipment Utilized During Treatment Gait belt    Activity Tolerance Patient tolerated treatment well    Behavior During Therapy WFL for tasks assessed/performed                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   HTN (hypertension) 07/19/2015   Hyperlipidemia    Major neurocognitive disorder 01/09/2014   Mild, related to stroke history   Nocturia    Renal insufficiency 06/25/2013   S/P carotid endarterectomy    BILATERAL ICA--  PATENT PER DUPLEX  05-19-2012   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/26/2017   Right calf   Urinary frequency    Vitamin D deficiency    Past Surgical History:  Procedure Laterality Date   APPENDECTOMY  AS CHILD   CARDIOVASCULAR STRESS TEST  03-27-2012  DR CRENSHAW   LOW RISK LEXISCAN STUDY-- PROBABLE NORMAL PERFUSION AND SOFT TISSUE ATTENUATION/  NO ISCHEMIA/ EF 51%   CAROTID ENDARTERECTOMY Bilateral LEFT  11-12-2008  DR GREG HAYES   RIGHT ICA  2006  ([facility])   CHOLECYSTECTOMY N/A 02/23/2022   Procedure: LAPAROSCOPIC CHOLECYSTECTOMY;  Surgeon: Quentin Ore, MD;   Location: [facility];  Service: General;  Laterality: N/A;   CYSTOSCOPY W/ RETROGRADES Bilateral 06/22/2021   Procedure: CYSTOSCOPY WITH RETROGRADE PYELOGRAM;  Surgeon: Marcine Matar, MD;  Location: [facility];  Service: Urology;  Laterality: Bilateral;   CYSTOSCOPY WITH LITHOLAPAXY N/A 02/26/2013   Procedure: CYSTOSCOPY WITH LITHOLAPAXY;  Surgeon: Marcine Matar, MD;  Location: [facility];  Service: Urology;  Laterality: N/A;   ENDOSCOPIC RETROGRADE CHOLANGIOPANCREATOGRAPHY (ERCP) WITH PROPOFOL N/A 02/22/2022   Procedure: ENDOSCOPIC RETROGRADE CHOLANGIOPANCREATOGRAPHY (ERCP) WITH PROPOFOL;  Surgeon: Jeani Hawking, MD;  Location: [facility] ENDOSCOPY;  Service: Gastroenterology;  Laterality: N/A;   EYE SURGERY  Jan. 2016   cataract surgery both eyes   INGUINAL HERNIA REPAIR Right 11-08-2006   IR KYPHO EA ADDL LEVEL THORACIC OR LUMBAR  02/12/2021   IR RADIOLOGIST EVAL & MGMT  02/18/2021   MASS EXCISION N/A 03/03/2016   Procedure: EXCISION OF BACK  MASS;  Surgeon: Almond Lint, MD;  Location: [facility];  Service: General;  Laterality: N/A;   MOHS SURGERY Left 1/ 2016   Dr Margo Aye-- Basal cell   PROSTATE SURGERY     REMOVAL OF STONES  02/22/2022   Procedure: REMOVAL OF STONES;  Surgeon: Jeani Hawking, MD;  Location: [facility] END[facility]COPY;  Service:  Gastroenterology;;   Dennison Mascot  02/22/2022   Procedure: Dennison Mascot;  Surgeon: Jeani Hawking, MD;  Location: [facility] ENDOSCOPY;  Service: Gastroenterology;;   TRANSURETHRAL RESECTION OF BLADDER TUMOR WITH MITOMYCIN-C N/A 06/22/2021   Procedure: TRANSURETHRAL RESECTION OF BLADDER TUMOR;  Surgeon: Marcine Matar, MD;  [facility];  Service: Urology;  Laterality: N/A;   TRANSURETHRAL RESECTION OF PROSTATE N/A 02/26/2013   Procedure: TRANSURETHRAL RESECTION OF THE PROSTATE WITH GYRUS INSTRUMENTS;  Surgeon: Marcine Matar, MD;  Location: [facility];  Service: Urology;  Laterality: N/A;    TRANSURETHRAL RESECTION OF PROSTATE N/A 06/22/2021   Procedure: TRANSURETHRAL RESECTION OF THE PROSTATE (TURP);  Surgeon: Marcine Matar, MD;  Location: [facility];  Service: Urology;  Laterality: N/A;   Patient Active Problem List   Diagnosis Date Noted   Acute encephalopathy 04/18/2023   Parkinson's disease (HCC) 04/18/2023   History of CVA with residual deficit 04/18/2023   Chronic kidney disease, stage 3b (HCC) 04/18/2023   Dementia with behavioral disturbance (HCC) 04/18/2023   Anemia of chronic disease 04/18/2023   Laceration of right upper extremity 02/24/2023   Dermatitis 02/24/2023   Lung nodules 02/24/2023   Dysuria 08/03/2022   Nonintractable headache 07/01/2022   Bilateral impacted cerumen 06/24/2022   Rash 06/15/2022   Postoperative ileus (HCC) 02/27/2022   Ileus, postoperative (HCC) 02/26/2022   Choledocholithiasis 02/19/2022   DNR (do not resuscitate) 02/19/2022   Left elbow pain 01/26/2022   Mid back pain on left side 01/26/2022   Rib pain 01/26/2022   Acute pain of left shoulder 11/12/2021   Leukocytes in urine 11/12/2021   Urinary frequency 11/12/2021   Thrush 10/08/2021   Hemiplegia, 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   Insomnia    Prediabetes    Acute 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 (HCC)    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   Transaminitis 07/27/2021   UTI (urinary tract infection) 07/27/2021   CVA (cerebral vascular accident) (HCC) 07/27/2021   Fall 07/27/2021   Hyperglycemia 07/27/2021   Cholelithiasis 07/27/2021   Hypoxia 07/27/2021   Nausea and vomiting 07/27/2021   Acute metabolic encephalopathy 07/27/2021   Normocytic anemia 07/27/2021   Chronic back pain 07/27/2021   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   Closed fracture of first lumbar vertebra with routine healing 02/03/2021   Closed fracture of multiple ribs 11/18/2020    Anxiety 01/29/2020   Leg pain, bilateral 01/29/2020   Ingrown toenail 07/13/2019   Lumbar spondylosis 05/02/2018   Pain in left knee 03/09/2018   Osteoarthritis of left hip 01/16/2018   Trochanteric bursitis of left hip 01/16/2018   Preventative health care 09/26/2017   HTN (hypertension) 07/19/2015   Hyperlipidemia 07/19/2015   Great toe pain 02/11/2014   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   Obesity (BMI 30-39.9) 06/25/2013   Renal insufficiency 06/25/2013   Weakness of left arm 06/25/2013   Sebaceous cyst 03/03/2011   Sprain of lumbar region 07/31/2010   Rib pain, left 08/29/2009   Carotid artery stenosis, asymptomatic, bilateral 05/02/2009   Eczema, atopic 05/31/2008   Vitamin D deficiency 03/01/2008   BPH (benign prostatic hyperplasia) 08/06/2007   Fasting hyperglycemia 12/21/2006   History of right MCA infarct 2006    ONSET DATE: 06/29/22 REFERRING DIAG:  Diagnosis  I63.9 (ICD-10-CM) - Cerebrovascular accident (CVA), unspecified mechanism (HCC)  W19.XXXD (ICD-10-CM) - Fall, subsequent encounter    THERAPY DIAG:  Muscle weakness (generalized)  Other abnormalities  of gait and mobility  Hemiplegia and hemiparesis following cerebral infarction affecting left non-dominant side (HCC)  History of falling  Difficulty in walking, not elsewhere classified  Repeated falls  Rationale for Evaluation and Treatment: Rehabilitation  SUBJECTIVE:                                                                                                                                                                                         SUBJECTIVE STATEMENT:  Patient and his family has had a lot of issues recently, he has had a hospital admit, a few syncopal issues.  His wife also fell and broke her neck, she is alright but Bob's daughter has been in town caring for him, his wife needs him to do better with transfers   PERTINENT HISTORY:  AHSIR SIMA is a 82 year old  man with dementia, CKD, HTN, CAD, HLD and history of CVA  (2006, 2023), Parkinson's  PAIN:  Are you having pain? Faces/behavior scale- mild pain BLEs   PRECAUTIONS: Fall  WEIGHT BEARING RESTRICTIONS: No  FALLS: Has patient fallen in last 6 months? No  LIVING ENVIRONMENT: Lives with: lives with their family and lives with their spouse Lives in: House/apartment Stairs:  1 brick high step Has following equipment at home: Environmental consultant - 2 wheeled, Wheelchair (manual), Shower bench, bed side commode, Grab bars, and hospital bed, sliding pad for car  PLOF: Independent with basic ADLs and Independent with household mobility without device  PATIENT GOALS: Walk around the block with his wife. Be able to use the dining room chair rather than have to use the W/C. Increased I with bed mobility and simple tasks at home like make some coffee or brush his teeth, Step over tub to shower, has bench, but he can't really use it. Up and down the one step to get to back deck. 11/23/22: Patient's wife [name] updated his goals today: Walk around store (gets distracted), swing a golf club again (thinking of driving range), get up on his own, walk without leaning forward, more recognition of the left side (from CVA), not need gait belt, OT-- be able to get dressed more on his own, navigate the newspaper.  OBJECTIVE:  TREATMENT: 05/17/23 Nustep level 5 x 5 mintues TUG 40 seconds, had a very difficult time getting up from sitting, needed cues 5XSTS: 75 seconds with CGA and a lot of verbal cues HHA walking with some cues 330 feet Standing marching, hip abduction In bars side stepping Ball toss, ball hits  Practiced sit to stands needing help with the left hand and getting his weight forward  04/12/23 Nustep level 5 x 5  minutes Gait outside around the back parking [place], rested and then from there around to the front of the building Standing ball toss, ball hits and then ball kicks, some SBA In pbars marching,  abduction and extension In pbars step reach a lot of cues needed for this today Gait out the back door around in the grass and uneven terrain to the car  04/05/23 UBE level 4 x 6 minutes Gait HHA walking outside a lot of cues for step length around the building to the front door [employer] kicks Side step in pbars Step turn reach in pbars AAROM left shoulder' Isometrics left shoulder  03/29/23 HHA working on amb with increased stride and safety - pt tends to vary speeds and lean fwd cuing needed STS with wt ball press HHA 6 in step tap 20 x with HHA then 20x without - min A with several LOB Func balance ex carrying obj Batting ball in standing Nustep L 5 6 min PWR moves sitting and standing with cuing Boxing  03/24/23 NuStep L5x47mins UBE L2 x88mins each way  Side stepping Marches Hip abduction STS minA from elevated mat- does not stand all the way up and needs one hand push off  Seated ball hits ad kicks  Seated chest press with 3# WaTe 2x10  03/15/23 Nustep level 5 x 7 minutes Gait outside with CGA and HHA multiple rests and redirection for big step Stairs step over step up and down with cues and CGA Seated volleyball hands and then kicks Side stepping Marches Hip abduction Fast walking with HHA  PATIENT EDUCATION: Education details: none new 02/16/23 Person educated: Patient and Spouse Education method: Explanation, Demonstration, Tactile cues, and Verbal cues Education comprehension: verbalized understanding, returned demonstration, verbal cues required, tactile cues required, and needs further education   ------------------------------------------------------------------------------------------------------- (Measures in this section from initial evaluation unless otherwise noted) DIAGNOSTIC FINDINGS:  MRI 2021 with degenerative changes in lumbar spine, L3-4 R subarticular stenosis COGNITION: Overall cognitive status: History of cognitive impairments - at  baseline SENSATION: Not tested COORDINATION: Moderately impaired BLE MUSCLE LENGTH: Hamstrings: severely restricted B Thomas test: Severely restricted B. POSTURE: rounded shoulders, forward head, increased thoracic kyphosis, posterior pelvic tilt, and flexed trunk  LOWER EXTREMITY ROM:   BLE extremely stiff throughout, limited hip ROM in all planes, B knees limited in extension. LOWER EXTREMITY MMT:  3+/5 throughout. Unable to determine accuracy of MMT due to cognition. Functionally demosntrates poor coordinated activation and poor muscular endurance. BED MOBILITY: Min A for Supine<> Sit. Patient was using a ladder in his hospital bed and could move MI. TRANSFERS: Min A, mod VC, tends to lean back.  11/02/22 independent with use of hands on arm chair with a couple of tries CURB: Min A-Has one step out to his deck. STAIRS: N/A  GAIT: Gait pattern: step to pattern, decreased arm swing- Right, decreased arm swing- Left, decreased step length- Right, decreased step length- Left, shuffling, festinating, trunk flexed, narrow BOS, poor foot clearance- Right, and poor foot clearance- Left Distance walked: 69' Assistive device utilized: Environmental consultant - 2 wheeled Level of assistance: Occasional min A Comments: mod VC to stay close to walker, take longer steps. FUNCTIONAL TESTS:  5 times sit to stand: 38.23  09/29/22  could not performs 5XSTS, 11/02/22 = 47 seconds with cues to use arms Timed up and go (TUG): 47.91  with RW, 07/21/22 TUG 29  seconds without device, 09/29/22 25 seconds, 11/02/22 TUG 20 seconds  PATIENT EDUCATION: Education details: Progress towards goals, POC Person educated: Patient and Spouse Education method: Explanation, Demonstration, and Verbal cues  Education comprehension: verbalized understanding, returned demonstration, verbal cues required, and needs further education  HOME EXERCISE PROGRAM: PWR!  moves  GOALS: Goals reviewed with patient? Yes  SHORT TERM GOALS: Target date: 12/22/22  Perform HEP for PWR! Moves with cues from family. Baseline: Pt's wife, [name], notes walking and hitting beach ball as main HEP. Goal status: GOAL MET, 12/20/2022   2. Decrease 5 x STS to < 35 sec to demonstrate improved functional strength  Baseline: 47 seconds on 5/21 per note; 34.5 sec UE support 12/20/2022  Goal status: GOAL MET, 12/20/2022   3. Perform sit <> stand transfers from average chair height with Supervision.  Baseline: CGA  12/20/2022; (01/10/23) CGA  Goal status: NOT MET  LONG TERM GOALS: Target date: 04/22/23  Patient will perform HEP progression with caregiver assist. Baseline: has minimal HEP Goal status: ONGOING  2.  Decrease 5 x STS to < 25 sec to demonstrate improved functional strength Baseline: 47 seconds on 5/21 per note; (01/10/23) 36 sec w/ cues for speed/sequence>30.38 sec with BUE supprot Goal status: PARTIALLY MET, 01/24/2023  3. Improve quality of gait to be able to ambulate x 100 feet with supervision. Baseline: CGA with cues  Goal status: REVISED  4.  Patient will perform his bed mobility with MI including rolling, sit<>supine, and scooting in bed. Baseline: on hard surface mat he is independent but on soft bed at home he struggles, wife reports only needs a hand to get up now 11/02/22; (01/10/23) modified indep.  Goal status: MET  5.  Patient will demonstrate floor<>stand with CGA and verbal cues for patient's wife to be able to assist. Baseline:  Has to call 9-1-1; +2 max assist floor>stand with UE support 01/19/2023 Goal status: GOAL NOT MET 01/19/2023 **UPDATED GOALS BELOW ** per recert 01/24/2023  SHORT TERM GOALS: Target date: 02/25/2023   Pt will perform HEP to maintain lower body strength, flexibility, posture with wife's assistance. Baseline:  Goal status: met 04/12/23  2. Pt will perform sit to stand, 8 of 10 reps with BUE support, with supervision, to maintain  safety with transfers.  Baseline: min guard  Goal status: 02/07/23-CGA from elevated surface and max VC to lean forward, ongoing   LONG TERM GOALS: Target date: 04/22/2023   Patient will perform HEP progression to maintain lower body strength, flexibility, posture with wife's assistance. Baseline:  Goal status: ongoing 04/12/23  2.  Pt will ambulate 150-175 ft in 2 MWT with min guard assist, to preserve functional mobility within the home.  Baseline: Goal status:  INITIAL  3. Wife will verbalize tips for fall prevention education to minimize fall frequency Baseline:  Goal status: ongoing 04/12/23   ASSESSMENT:  CLINICAL IMPRESSION:  Patient has not been in to see [place] in about a month, he has had at least one maybe two hospital admissions and his wife fell and broke her neck, she is now in  a collar and has had help over the past month, however the help will be leaving next week.  That means she really needs Nadine Counts to stand better so she is not pulling and lifting on him and not straining her neck and back.  Since we have not seen him he really has regressed as noted above byt hte TUG and 5XSTS tests, really with more neglect of the left hand and arm  OBJECTIVE IMPAIRMENTS: Abnormal gait, decreased  activity tolerance, decreased balance, decreased cognition, decreased coordination, decreased endurance, decreased mobility, difficulty walking, decreased ROM, decreased strength, decreased safety awareness, impaired flexibility, impaired UE functional use, improper body mechanics, and postural dysfunction.   PLAN:  PT FREQUENCY: 1x/week  PT DURATION: 12 weeks  PLANNED INTERVENTIONS: Therapeutic exercises, Therapeutic activity, Neuromuscular re-education, Balance training, Gait training, Patient/Family education, Self Care, Joint mobilization, Stair training, Moist heat, and Manual therapy  PLAN FOR NEXT SESSION:    Did renewal today and will cotntinue to try to help him and his wife stay  independent and functioning at home  Patient Details  Name: MAURIE KETELHUT MRN: [id number] Date of Birth: 1940/06/18 Referring Provider:  Donato Schultz, *  Encounter Date: 05/17/2023   Jearld Lesch, PT 05/17/2023, 3:02 PM  [facility] [address] [place], [place], [address] Phone: [phone]   Fax:  [phone]

## 2023-05-23 ENCOUNTER — Other Ambulatory Visit: Payer: Self-pay

## 2023-05-26 ENCOUNTER — Ambulatory Visit: Payer: PPO | Admitting: Physical Therapy

## 2023-05-26 ENCOUNTER — Encounter: Payer: Self-pay | Admitting: Physical Therapy

## 2023-05-26 DIAGNOSIS — M6281 Muscle weakness (generalized): Secondary | ICD-10-CM

## 2023-05-26 DIAGNOSIS — R2689 Other abnormalities of gait and mobility: Secondary | ICD-10-CM

## 2023-05-26 DIAGNOSIS — Z9181 History of falling: Secondary | ICD-10-CM

## 2023-05-26 DIAGNOSIS — R296 Repeated falls: Secondary | ICD-10-CM

## 2023-05-26 DIAGNOSIS — I69354 Hemiplegia and hemiparesis following cerebral infarction affecting left non-dominant side: Secondary | ICD-10-CM

## 2023-05-26 DIAGNOSIS — R262 Difficulty in walking, not elsewhere classified: Secondary | ICD-10-CM

## 2023-05-26 NOTE — Therapy (Signed)
OUTPATIENT PHYSICAL THERAPY NEURO TREATMENT  Patient Name: William [name] MRN: [id number] DOB:[birth year], 82 y.o., male Today's Date: 05/26/2023  PCP: Donato Schultz  DO REFERRING PROVIDER: same   END OF SESSION:  PT End of Session - 05/26/23 1322     Visit Number 60    Number of Visits 66    Date for PT Re-Evaluation 06/17/23    Authorization Type HTA    PT Start Time 1315    PT Stop Time 1400    PT Time Calculation (min) 45 min    Equipment Utilized During Treatment Gait belt    Activity Tolerance Patient tolerated treatment well    Behavior During Therapy WFL for tasks assessed/performed                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   HTN (hypertension) 07/19/2015   Hyperlipidemia    Major neurocognitive disorder 01/09/2014   Mild, related to stroke history   Nocturia    Renal insufficiency 06/25/2013   S/P carotid endarterectomy    BILATERAL ICA--  PATENT PER DUPLEX  05-19-2012   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/26/2017   Right calf   Urinary frequency    Vitamin D deficiency    Past Surgical History:  Procedure Laterality Date   APPENDECTOMY  AS CHILD   CARDIOVASCULAR STRESS TEST  03-27-2012  DR CRENSHAW   LOW RISK LEXISCAN STUDY-- PROBABLE NORMAL PERFUSION AND SOFT TISSUE ATTENUATION/  NO ISCHEMIA/ EF 51%   CAROTID ENDARTERECTOMY Bilateral LEFT  11-12-2008  DR GREG HAYES   RIGHT ICA  2006  ([facility])   CHOLECYSTECTOMY N/A 02/23/2022   Procedure: LAPAROSCOPIC CHOLECYSTECTOMY;  Surgeon: Quentin Ore, MD;   Location: [facility];  Service: General;  Laterality: N/A;   CYSTOSCOPY W/ RETROGRADES Bilateral 06/22/2021   Procedure: CYSTOSCOPY WITH RETROGRADE PYELOGRAM;  Surgeon: Marcine Matar, MD;  Location: [facility];  Service: Urology;  Laterality: Bilateral;   CYSTOSCOPY WITH LITHOLAPAXY N/A 02/26/2013   Procedure: CYSTOSCOPY WITH LITHOLAPAXY;  Surgeon: Marcine Matar, MD;  Location: [facility];  Service: Urology;  Laterality: N/A;   ENDOSCOPIC RETROGRADE CHOLANGIOPANCREATOGRAPHY (ERCP) WITH PROPOFOL N/A 02/22/2022   Procedure: ENDOSCOPIC RETROGRADE CHOLANGIOPANCREATOGRAPHY (ERCP) WITH PROPOFOL;  Surgeon: Jeani Hawking, MD;  Location: [facility] ENDOSCOPY;  Service: Gastroenterology;  Laterality: N/A;   EYE SURGERY  Jan. 2016   cataract surgery both eyes   INGUINAL HERNIA REPAIR Right 11-08-2006   IR KYPHO EA ADDL LEVEL THORACIC OR LUMBAR  02/12/2021   IR RADIOLOGIST EVAL & MGMT  02/18/2021   MASS EXCISION N/A 03/03/2016   Procedure: EXCISION OF BACK  MASS;  Surgeon: Almond Lint, MD;  Location: [facility];  Service: General;  Laterality: N/A;   MOHS SURGERY Left 1/ 2016   Dr Margo Aye-- Basal cell   PROSTATE SURGERY     REMOVAL OF STONES  02/22/2022   Procedure: REMOVAL OF STONES;  Surgeon: Jeani Hawking, MD;  Location: [facility] ENDOSCOPY;  Service:  Gastroenterology;;   William [name]  02/22/2022   Procedure: William [name];  Surgeon: Jeani Hawking, MD;  Location: [facility] ENDOSCOPY;  Service: Gastroenterology;;   TRANSURETHRAL RESECTION OF BLADDER TUMOR WITH MITOMYCIN-C N/A 06/22/2021   Procedure: TRANSURETHRAL RESECTION OF BLADDER TUMOR;  Surgeon: Marcine Matar, MD;  Location: [facility];  Service: Urology;  Laterality: N/A;   TRANSURETHRAL RESECTION OF PROSTATE N/A 02/26/2013   Procedure: TRANSURETHRAL RESECTION OF THE PROSTATE WITH GYRUS INSTRUMENTS;  Surgeon: Marcine Matar, MD;  Location: [facility];  Service: Urology;  Laterality: N/A;    TRANSURETHRAL RESECTION OF PROSTATE N/A 06/22/2021   Procedure: TRANSURETHRAL RESECTION OF THE PROSTATE (TURP);  Surgeon: Marcine Matar, MD;  Location: [facility];  Service: Urology;  Laterality: N/A;   Patient Active Problem List   Diagnosis Date Noted   Acute encephalopathy 04/18/2023   Parkinson's disease (HCC) 04/18/2023   History of CVA with residual deficit 04/18/2023   Chronic kidney disease, stage 3b (HCC) 04/18/2023   Dementia with behavioral disturbance (HCC) 04/18/2023   Anemia of chronic disease 04/18/2023   Laceration of right upper extremity 02/24/2023   Dermatitis 02/24/2023   Lung nodules 02/24/2023   Dysuria 08/03/2022   Nonintractable headache 07/01/2022   Bilateral impacted cerumen 06/24/2022   Rash 06/15/2022   Postoperative ileus (HCC) 02/27/2022   Ileus, postoperative (HCC) 02/26/2022   Choledocholithiasis 02/19/2022   DNR (do not resuscitate) 02/19/2022   Left elbow pain 01/26/2022   Mid back pain on left side 01/26/2022   Rib pain 01/26/2022   Acute pain of left shoulder 11/12/2021   Leukocytes in urine 11/12/2021   Urinary frequency 11/12/2021   Thrush 10/08/2021   Hemiplegia, 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   Insomnia    Prediabetes    Acute 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 (HCC)    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   Transaminitis 07/27/2021   UTI (urinary tract infection) 07/27/2021   CVA (cerebral vascular accident) (HCC) 07/27/2021   Fall 07/27/2021   Hyperglycemia 07/27/2021   Cholelithiasis 07/27/2021   Hypoxia 07/27/2021   Nausea and vomiting 07/27/2021   Acute metabolic encephalopathy 07/27/2021   Normocytic anemia 07/27/2021   Chronic back pain 07/27/2021   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   Closed fracture of first lumbar vertebra with routine healing 02/03/2021   Closed fracture of multiple ribs 11/18/2020    Anxiety 01/29/2020   Leg pain, bilateral 01/29/2020   Ingrown toenail 07/13/2019   Lumbar spondylosis 05/02/2018   Pain in left knee 03/09/2018   Osteoarthritis of left hip 01/16/2018   Trochanteric bursitis of left hip 01/16/2018   Preventative health care 09/26/2017   HTN (hypertension) 07/19/2015   Hyperlipidemia 07/19/2015   Great toe pain 02/11/2014   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   Obesity (BMI 30-39.9) 06/25/2013   Renal insufficiency 06/25/2013   Weakness of left arm 06/25/2013   Sebaceous cyst 03/03/2011   Sprain of lumbar region 07/31/2010   Rib pain, left 08/29/2009   Carotid artery stenosis, asymptomatic, bilateral 05/02/2009   Eczema, atopic 05/31/2008   Vitamin D deficiency 03/01/2008   BPH (benign prostatic hyperplasia) 08/06/2007   Fasting hyperglycemia 12/21/2006   History of right MCA infarct 2006    ONSET DATE: 06/29/22 REFERRING DIAG:  Diagnosis  I63.9 (ICD-10-CM) - Cerebrovascular accident (CVA), unspecified mechanism (HCC)  W19.XXXD (ICD-10-CM) - Fall, subsequent encounter    THERAPY DIAG:  Muscle weakness (generalized)  Other abnormalities  of gait and mobility  Hemiplegia and hemiparesis following cerebral infarction affecting left non-dominant side (HCC)  History of falling  Difficulty in walking, not elsewhere classified  Repeated falls  Rationale for Evaluation and Treatment: Rehabilitation  SUBJECTIVE:                                                                                                                                                                                         SUBJECTIVE STATEMENT:  Patient still has his daughter here with him as his wife recovers from a fall and a neck fracture, the daughter will be leaving this weekend  PERTINENT HISTORY:  William [name] is a 82 year old man with dementia, CKD, HTN, CAD, HLD and history of CVA  (2006, 2023), Parkinson's  PAIN:  Are you having pain?  Faces/behavior scale- mild pain BLEs   PRECAUTIONS: Fall  WEIGHT BEARING RESTRICTIONS: No  FALLS: Has patient fallen in last 6 months? No  LIVING ENVIRONMENT: Lives with: lives with their family and lives with their spouse Lives in: House/apartment Stairs:  1 brick high step Has following equipment at home: Environmental consultant - 2 wheeled, Wheelchair (manual), Shower bench, bed side commode, Grab bars, and hospital bed, sliding pad for car  PLOF: Independent with basic ADLs and Independent with household mobility without device  PATIENT GOALS: Walk around the block with his wife. Be able to use the dining room chair rather than have to use the W/C. Increased I with bed mobility and simple tasks at home like make some coffee or brush his teeth, Step over tub to shower, has bench, but he can't really use it. Up and down the one step to get to back deck. 11/23/22: Patient's wife [name] updated his goals today: Walk around store (gets distracted), swing a golf club again (thinking of driving range), get up on his own, walk without leaning forward, more recognition of the left side (from CVA), not need gait belt, OT-- be able to get dressed more on his own, navigate the newspaper.  OBJECTIVE:  TREATMENT: 05/26/23 Bike level 4 x 5 minutes Nustep level 5 x 5 minutes Side stepping, step turn reaches [employer], volley ball [employer] Walking 200 feet x 2 Sit to stand with verbal cues Gentle Passive ROM left shoulder Red tband HS curls   05/17/23 Nustep level 5 x 5 mintues TUG 40 seconds, had a very difficult time getting up from sitting, needed cues 5XSTS: 75 seconds with CGA and a lot of verbal cues HHA walking with some cues 330 feet Standing marching, hip abduction In bars side stepping Ball toss, ball hits  Practiced sit to  stands needing help with the left hand and getting his weight forward  04/12/23 Nustep level 5 x 5 minutes Gait outside around the back parking William [name], rested and then  from there around to the front of the building Standing ball toss, ball hits and then ball kicks, some SBA In pbars marching, abduction and extension In pbars step reach a lot of cues needed for this today Gait out the back door around in the grass and uneven terrain to the car  04/05/23 UBE level 4 x 6 minutes Gait HHA walking outside a lot of cues for step length around the building to the front door [employer] kicks Side step in pbars Step turn reach in pbars AAROM left shoulder' Isometrics left shoulder  03/29/23 HHA working on amb with increased stride and safety - pt tends to vary speeds and lean fwd cuing needed STS with wt ball press HHA 6 in step tap 20 x with HHA then 20x without - min A with several LOB Func balance ex carrying obj Batting ball in standing Nustep L 5 6 min PWR moves sitting and standing with cuing Boxing  03/24/23 NuStep L5x57mins UBE L2 x10mins each way  Side stepping Marches Hip abduction STS minA from elevated mat- does not stand all the way up and needs one hand push off  Seated ball hits ad kicks  Seated chest press with 3# WaTe 2x10  03/15/23 Nustep level 5 x 7 minutes Gait outside with CGA and HHA multiple rests and redirection for big step Stairs step over step up and down with cues and CGA Seated volleyball hands and then kicks Side stepping Marches Hip abduction Fast walking with HHA  PATIENT EDUCATION: Education details: none new 02/16/23 Person educated: Patient and Spouse Education method: Explanation, Demonstration, Tactile cues, and Verbal cues Education comprehension: verbalized understanding, returned demonstration, verbal cues required, tactile cues required, and needs further education   ------------------------------------------------------------------------------------------------------- (Measures in this section from initial evaluation unless otherwise noted) DIAGNOSTIC FINDINGS:  MRI 2021 with degenerative changes  in lumbar spine, L3-4 R subarticular stenosis COGNITION: Overall cognitive status: History of cognitive impairments - at baseline SENSATION: Not tested COORDINATION: Moderately impaired BLE MUSCLE LENGTH: Hamstrings: severely restricted B Thomas test: Severely restricted B. POSTURE: rounded shoulders, forward head, increased thoracic kyphosis, posterior pelvic tilt, and flexed trunk  LOWER EXTREMITY ROM:   BLE extremely stiff throughout, limited hip ROM in all planes, B knees limited in extension. LOWER EXTREMITY MMT:  3+/5 throughout. Unable to determine accuracy of MMT due to cognition. Functionally demosntrates poor coordinated activation and poor muscular endurance. BED MOBILITY: Min A for Supine<> Sit. Patient was using a ladder in his hospital bed and could move MI. TRANSFERS: Min A, mod VC, tends to lean back.  11/02/22 independent with use of hands on arm chair with a couple of tries CURB: Min A-Has one step out to his deck. STAIRS: N/A  GAIT: Gait pattern: step to pattern, decreased arm swing- Right, decreased arm swing- Left, decreased step length- Right, decreased step length- Left, shuffling, festinating, trunk flexed, narrow BOS, poor foot clearance- Right, and poor foot clearance- Left Distance walked: 40' Assistive device utilized: Environmental consultant - 2 wheeled Level of assistance: Occasional min A Comments: mod VC to stay close to walker, take longer steps. FUNCTIONAL TESTS:  5 times sit to stand: 38.23  09/29/22  could not performs 5XSTS, 11/02/22 = 47 seconds with cues to use arms Timed up and go (TUG): 47.91  with RW, 07/21/22  TUG 29  seconds without device, 09/29/22 25 seconds, 11/02/22 TUG 20 seconds                                                                     PATIENT EDUCATION: Education details: Progress towards goals, POC Person educated: Patient and Spouse Education method: Explanation, Demonstration, and Verbal cues  Education comprehension: verbalized understanding,  returned demonstration, verbal cues required, and needs further education  HOME EXERCISE PROGRAM: PWR! moves  GOALS: Goals reviewed with patient? Yes  SHORT TERM GOALS: Target date: 12/22/22  Perform HEP for PWR! Moves with cues from family. Baseline: Pt's wife, [name], notes walking and hitting beach ball as main HEP. Goal status: GOAL MET, 12/20/2022   2. Decrease 5 x STS to < 35 sec to demonstrate improved functional strength  Baseline: 47 seconds on 5/21 per note; 34.5 sec UE support 12/20/2022  Goal status: GOAL MET, 12/20/2022   3. Perform sit <> stand transfers from average chair height with Supervision.  Baseline: CGA  12/20/2022; (01/10/23) CGA  Goal status: NOT MET  LONG TERM GOALS: Target date: 04/22/23  Patient will perform HEP progression with caregiver assist. Baseline: has minimal HEP Goal status: ONGOING  2.  Decrease 5 x STS to < 25 sec to demonstrate improved functional strength Baseline: 47 seconds on 5/21 per note; (01/10/23) 36 sec w/ cues for speed/sequence>30.38 sec with BUE supprot Goal status: PARTIALLY MET, 01/24/2023  3. Improve quality of gait to be able to ambulate x 100 feet with supervision. Baseline: CGA with cues  Goal status: REVISED  4.  Patient will perform his bed mobility with MI including rolling, sit<>supine, and scooting in bed. Baseline: on hard surface mat he is independent but on soft bed at home he struggles, wife reports only needs a hand to get up now 11/02/22; (01/10/23) modified indep.  Goal status: MET  5.  Patient will demonstrate floor<>stand with CGA and verbal cues for patient's wife to be able to assist. Baseline:  Has to call 9-1-1; +2 max assist floor>stand with UE support 01/19/2023 Goal status: GOAL NOT MET 01/19/2023 **UPDATED GOALS BELOW ** per recert 01/24/2023  SHORT TERM GOALS: Target date: 02/25/2023   Pt will perform HEP to maintain lower body strength, flexibility, posture with wife's assistance. Baseline:  Goal status: met  04/12/23  2. Pt will perform sit to stand, 8 of 10 reps with BUE support, with supervision, to maintain safety with transfers.  Baseline: min guard  Goal status: 02/07/23-CGA from elevated surface and max VC to lean forward, ongoing   LONG TERM GOALS: Target date: 04/22/2023   Patient will perform HEP progression to maintain lower body strength, flexibility, posture with wife's assistance. Baseline:  Goal status: ongoing 04/12/23  2.  Pt will ambulate 150-175 ft in 2 MWT with min guard assist, to preserve functional mobility within the home.  Baseline: Goal status:  INITIAL  3. Wife will verbalize tips for fall prevention education to minimize fall frequency Baseline:  Goal status: ongoing 04/12/23   ASSESSMENT:  CLINICAL IMPRESSION:  Patient has his daughter here since his wife is recovering from a fall where she sustained a neck fracture.  The daughter will be leaving soon and the care responsibilities will go back to  the wife, did more sit to stand today and really trying to get him to use the arms and nose over toes so the wife will not have to pull or lift.  William [name] did great today followed directions, less distractions  OBJECTIVE IMPAIRMENTS: Abnormal gait, decreased activity tolerance, decreased balance, decreased cognition, decreased coordination, decreased endurance, decreased mobility, difficulty walking, decreased ROM, decreased strength, decreased safety awareness, impaired flexibility, impaired UE functional use, improper body mechanics, and postural dysfunction.   PLAN:  PT FREQUENCY: 1x/week  PT DURATION: 12 weeks  PLANNED INTERVENTIONS: Therapeutic exercises, Therapeutic activity, Neuromuscular re-education, Balance training, Gait training, Patient/Family education, Self Care, Joint mobilization, Stair training, Moist heat, and Manual therapy  PLAN FOR NEXT SESSION:    Did renewal today and will cotntinue to try to help him and his wife stay independent and functioning  at home  Patient Details  Name: William [name] MRN: [id number] Date of Birth: 1941-03-02 Referring Provider:  Donato Schultz, *  Encounter Date: 05/26/2023   Jearld Lesch, PT 05/26/2023, 1:23 PM  [facility] [address]. [place], [place], [address] Phone: [phone]   Fax:  [phone]

## 2023-05-31 ENCOUNTER — Encounter: Payer: Self-pay | Admitting: Physical Therapy

## 2023-05-31 ENCOUNTER — Ambulatory Visit: Payer: PPO | Admitting: Physical Therapy

## 2023-05-31 DIAGNOSIS — Z9181 History of falling: Secondary | ICD-10-CM

## 2023-05-31 DIAGNOSIS — M6281 Muscle weakness (generalized): Secondary | ICD-10-CM | POA: Diagnosis not present

## 2023-05-31 DIAGNOSIS — R262 Difficulty in walking, not elsewhere classified: Secondary | ICD-10-CM

## 2023-05-31 DIAGNOSIS — R2689 Other abnormalities of gait and mobility: Secondary | ICD-10-CM

## 2023-05-31 DIAGNOSIS — I69354 Hemiplegia and hemiparesis following cerebral infarction affecting left non-dominant side: Secondary | ICD-10-CM

## 2023-05-31 DIAGNOSIS — R296 Repeated falls: Secondary | ICD-10-CM

## 2023-05-31 NOTE — Therapy (Signed)
OUTPATIENT PHYSICAL THERAPY NEURO TREATMENT  Patient Name: William [name] MRN: [id number] DOB:08/30/1940, 82 y.o., male Today's Date: 05/31/2023  PCP: Donato Schultz  DO REFERRING PROVIDER: same   END OF SESSION:  PT End of Session - 05/31/23 1356     Visit Number 61    Number of Visits 66    Date for PT Re-Evaluation 06/17/23    Authorization Type HTA    PT Start Time 1355    PT Stop Time 1439    PT Time Calculation (min) 44 min    Activity Tolerance Patient tolerated treatment well    Behavior During Therapy WFL for tasks assessed/performed                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   HTN (hypertension) 07/19/2015   Hyperlipidemia    Major neurocognitive disorder 01/09/2014   Mild, related to stroke history   Nocturia    Renal insufficiency 06/25/2013   S/P carotid endarterectomy    BILATERAL ICA--  PATENT PER DUPLEX  05-19-2012   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/26/2017   Right calf   Urinary frequency    Vitamin D deficiency    Past Surgical History:  Procedure Laterality Date   APPENDECTOMY  AS CHILD   CARDIOVASCULAR STRESS TEST  03-27-2012  DR CRENSHAW   LOW RISK LEXISCAN STUDY-- PROBABLE NORMAL PERFUSION AND SOFT TISSUE ATTENUATION/  NO ISCHEMIA/ EF 51%   CAROTID ENDARTERECTOMY Bilateral LEFT  11-12-2008  DR GREG HAYES   RIGHT ICA  2006  ([facility])   CHOLECYSTECTOMY N/A 02/23/2022   Procedure: LAPAROSCOPIC CHOLECYSTECTOMY;  Surgeon: Quentin Ore, MD;  Location: [facility] OR;  Service: General;  Laterality: N/A;    CYSTOSCOPY W/ RETROGRADES Bilateral 06/22/2021   Procedure: CYSTOSCOPY WITH RETROGRADE PYELOGRAM;  Surgeon: Marcine Matar, MD;  Location: [facility];  Service: Urology;  Laterality: Bilateral;   CYSTOSCOPY WITH LITHOLAPAXY N/A 02/26/2013   Procedure: CYSTOSCOPY WITH LITHOLAPAXY;  Surgeon: Marcine Matar, MD;  Location: [facility];  Service: Urology;  Laterality: N/A;   ENDOSCOPIC RETROGRADE CHOLANGIOPANCREATOGRAPHY (ERCP) WITH PROPOFOL N/A 02/22/2022   Procedure: ENDOSCOPIC RETROGRADE CHOLANGIOPANCREATOGRAPHY (ERCP) WITH PROPOFOL;  Surgeon: Jeani Hawking, MD;  Location: [facility] ENDOSCOPY;  Service: Gastroenterology;  Laterality: N/A;   EYE SURGERY  Jan. 2016   cataract surgery both eyes   INGUINAL HERNIA REPAIR Right 11-08-2006   IR KYPHO EA ADDL LEVEL THORACIC OR LUMBAR  02/12/2021   IR RADIOLOGIST EVAL & MGMT  02/18/2021   MASS EXCISION N/A 03/03/2016   Procedure: EXCISION OF BACK  MASS;  Surgeon: Almond Lint, MD;  Location: [facility];  Service: General;  Laterality: N/A;   MOHS SURGERY Left 1/ 2016   Dr Margo Aye-- Basal cell   PROSTATE SURGERY     REMOVAL OF STONES  02/22/2022   Procedure: REMOVAL OF STONES;  Surgeon: Jeani Hawking, MD;  Location: [facility] ENDOSCOPY;  Service: Gastroenterology;;   William [name]  02/22/2022   Procedure:  SPHINCTEROTOMY;  Surgeon: Jeani Hawking, MD;  Location: [facility] ENDOSCOPY;  Service: Gastroenterology;;   TRANSURETHRAL RESECTION OF BLADDER TUMOR WITH MITOMYCIN-C N/A 06/22/2021   Procedure: TRANSURETHRAL RESECTION OF BLADDER TUMOR;  Surgeon: Marcine Matar, MD;  Location: [facility];  Service: Urology;  Laterality: N/A;   TRANSURETHRAL RESECTION OF PROSTATE N/A 02/26/2013   Procedure: TRANSURETHRAL RESECTION OF THE PROSTATE WITH GYRUS INSTRUMENTS;  Surgeon: Marcine Matar, MD;  Location: [facility];  Service: Urology;  Laterality: N/A;   TRANSURETHRAL RESECTION OF PROSTATE N/A 06/22/2021    Procedure: TRANSURETHRAL RESECTION OF THE PROSTATE (TURP);  Surgeon: Marcine Matar, MD;  Location: [facility];  Service: Urology;  Laterality: N/A;   Patient Active Problem List   Diagnosis Date Noted   Acute encephalopathy 04/18/2023   Parkinson's disease (HCC) 04/18/2023   History of CVA with residual deficit 04/18/2023   Chronic kidney disease, stage 3b (HCC) 04/18/2023   Dementia with behavioral disturbance (HCC) 04/18/2023   Anemia of chronic disease 04/18/2023   Laceration of right upper extremity 02/24/2023   Dermatitis 02/24/2023   Lung nodules 02/24/2023   Dysuria 08/03/2022   Nonintractable headache 07/01/2022   Bilateral impacted cerumen 06/24/2022   Rash 06/15/2022   Postoperative ileus (HCC) 02/27/2022   Ileus, postoperative (HCC) 02/26/2022   Choledocholithiasis 02/19/2022   DNR (do not resuscitate) 02/19/2022   Left elbow pain 01/26/2022   Mid back pain on left side 01/26/2022   Rib pain 01/26/2022   Acute pain of left shoulder 11/12/2021   Leukocytes in urine 11/12/2021   Urinary frequency 11/12/2021   Thrush 10/08/2021   Hemiplegia, 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   Insomnia    Prediabetes    Acute 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 (HCC)    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   Transaminitis 07/27/2021   UTI (urinary tract infection) 07/27/2021   CVA (cerebral vascular accident) (HCC) 07/27/2021   Fall 07/27/2021   Hyperglycemia 07/27/2021   Cholelithiasis 07/27/2021   Hypoxia 07/27/2021   Nausea and vomiting 07/27/2021   Acute metabolic encephalopathy 07/27/2021   Normocytic anemia 07/27/2021   Chronic back pain 07/27/2021   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   Closed fracture of first lumbar vertebra with routine healing 02/03/2021   Closed fracture of multiple ribs 11/18/2020   Anxiety 01/29/2020   Leg pain, bilateral 01/29/2020    Ingrown toenail 07/13/2019   Lumbar spondylosis 05/02/2018   Pain in left knee 03/09/2018   Osteoarthritis of left hip 01/16/2018   Trochanteric bursitis of left hip 01/16/2018   Preventative health care 09/26/2017   HTN (hypertension) 07/19/2015   Hyperlipidemia 07/19/2015   Great toe pain 02/11/2014   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   Obesity (BMI 30-39.9) 06/25/2013   Renal insufficiency 06/25/2013   Weakness of left arm 06/25/2013   Sebaceous cyst 03/03/2011   Sprain of lumbar region 07/31/2010   Rib pain, left 08/29/2009   Carotid artery stenosis, asymptomatic, bilateral 05/02/2009   Eczema, atopic 05/31/2008   Vitamin D deficiency 03/01/2008   BPH (benign prostatic hyperplasia) 08/06/2007   Fasting hyperglycemia 12/21/2006   History of right MCA infarct 2006    ONSET DATE: 06/29/22 REFERRING DIAG:  Diagnosis  I63.9 (ICD-10-CM) - Cerebrovascular accident (CVA), unspecified mechanism (HCC)  W19.XXXD (ICD-10-CM) - Fall, subsequent encounter    THERAPY DIAG:  Muscle weakness (generalized)  Other abnormalities of gait and mobility  Hemiplegia and hemiparesis following  cerebral infarction affecting left non-dominant side (HCC)  History of falling  Difficulty in walking, not elsewhere classified  Repeated falls  Rationale for Evaluation and Treatment: Rehabilitation  SUBJECTIVE:                                                                                                                                                                                         SUBJECTIVE STATEMENT:  Patient now without daughter, he is going to [facility] 3x/week.  Wife is recovering from a fall herself  PERTINENT HISTORY:  William [name] is a 82 year old man with dementia, CKD, HTN, CAD, HLD and history of CVA  (2006, 2023), Parkinson's  PAIN:  Are you having pain? Faces/behavior scale- mild pain BLEs   PRECAUTIONS: Fall  WEIGHT BEARING RESTRICTIONS:  No  FALLS: Has patient fallen in last 6 months? No  LIVING ENVIRONMENT: Lives with: lives with their family and lives with their spouse Lives in: House/apartment Stairs:  1 brick high step Has following equipment at home: Environmental consultant - 2 wheeled, Wheelchair (manual), Shower bench, bed side commode, Grab bars, and hospital bed, sliding pad for car  PLOF: Independent with basic ADLs and Independent with household mobility without device  PATIENT GOALS: Walk around the block with his wife. Be able to use the dining room chair rather than have to use the W/C. Increased I with bed mobility and simple tasks at home like make some coffee or brush his teeth, Step over tub to shower, has bench, but he can't really use it. Up and down the one step to get to back deck. 11/23/22: Patient's wife [name] updated his goals today: Walk around store (gets distracted), swing a golf club again (thinking of driving range), get up on his own, walk without leaning forward, more recognition of the left side (from CVA), not need gait belt, OT-- be able to get dressed more on his own, navigate the newspaper.  OBJECTIVE:  TREATMENT: 05/31/23 Nustep level 5 x 7 minutes Gait outside with HHA 200 feet, rest and then 300 feet. Standing ball toss Ball kicks 2.5# LAQ, marches Side stepping, forward and backward walking In pbars hip abduction In pbars step turn reach On bike level 4 x 6 minutes William [name] with HHA x 200 feet  05/26/23 Bike level 4 x 5 minutes Nustep level 5 x 5 minutes Side stepping, step turn reaches [employer], volley ball [employer] Walking 200 feet x 2 Sit to stand with verbal cues Gentle Passive ROM left shoulder Red tband HS curls   05/17/23 Nustep level 5 x 5 mintues TUG 40 seconds, had a very difficult time getting up  from sitting, needed cues 5XSTS: 75 seconds with CGA and a lot of verbal cues HHA walking with some cues 330 feet Standing marching, hip abduction In bars side stepping Ball  toss, ball hits  Practiced sit to stands needing help with the left hand and getting his weight forward  04/12/23 Nustep level 5 x 5 minutes Gait outside around the back parking William [name], rested and then from there around to the front of the building Standing ball toss, ball hits and then ball kicks, some SBA In pbars marching, abduction and extension In pbars step reach a lot of cues needed for this today Gait out the back door around in the grass and uneven terrain to the car  04/05/23 UBE level 4 x 6 minutes Gait HHA walking outside a lot of cues for step length around the building to the front door [employer] kicks Side step in pbars Step turn reach in pbars AAROM left shoulder' Isometrics left shoulder  03/29/23 HHA working on amb with increased stride and safety - pt tends to vary speeds and lean fwd cuing needed STS with wt ball press HHA 6 in step tap 20 x with HHA then 20x without - min A with several LOB Func balance ex carrying obj Batting ball in standing Nustep L 5 6 min PWR moves sitting and standing with cuing Boxing  03/24/23 NuStep L5x31mins UBE L2 x75mins each way  Side stepping Marches Hip abduction STS minA from elevated mat- does not stand all the way up and needs one hand push off  Seated ball hits ad kicks  Seated chest press with 3# WaTe 2x10  03/15/23 Nustep level 5 x 7 minutes Gait outside with CGA and HHA multiple rests and redirection for big step Stairs step over step up and down with cues and CGA Seated volleyball hands and then kicks Side stepping Marches Hip abduction Fast walking with HHA  PATIENT EDUCATION: Education details: none new 02/16/23 Person educated: Patient and Spouse Education method: Explanation, Demonstration, Tactile cues, and Verbal cues Education comprehension: verbalized understanding, returned demonstration, verbal cues required, tactile cues required, and needs further education    ------------------------------------------------------------------------------------------------------- (Measures in this section from initial evaluation unless otherwise noted) DIAGNOSTIC FINDINGS:  MRI 2021 with degenerative changes in lumbar spine, L3-4 R subarticular stenosis COGNITION: Overall cognitive status: History of cognitive impairments - at baseline SENSATION: Not tested COORDINATION: Moderately impaired BLE MUSCLE LENGTH: Hamstrings: severely restricted B Thomas test: Severely restricted B. POSTURE: rounded shoulders, forward head, increased thoracic kyphosis, posterior pelvic tilt, and flexed trunk  LOWER EXTREMITY ROM:   BLE extremely stiff throughout, limited hip ROM in all planes, B knees limited in extension. LOWER EXTREMITY MMT:  3+/5 throughout. Unable to determine accuracy of MMT due to cognition. Functionally demosntrates poor coordinated activation and poor muscular endurance. BED MOBILITY: Min A for Supine<> Sit. Patient was using a ladder in his hospital bed and could move MI. TRANSFERS: Min A, mod VC, tends to lean back.  11/02/22 independent with use of hands on arm chair with a couple of tries CURB: Min A-Has one step out to his deck. STAIRS: N/A  GAIT: Gait pattern: step to pattern, decreased arm swing- Right, decreased arm swing- Left, decreased step length- Right, decreased step length- Left, shuffling, festinating, trunk flexed, narrow BOS, poor foot clearance- Right, and poor foot clearance- Left Distance walked: 63' Assistive device utilized: Environmental consultant - 2 wheeled Level of assistance: Occasional min A Comments: mod VC to stay close to walker,  take longer steps. FUNCTIONAL TESTS:  5 times sit to stand: 38.23  09/29/22  could not performs 5XSTS, 11/02/22 = 47 seconds with cues to use arms Timed up and go (TUG): 47.91  with RW, 07/21/22 TUG 29  seconds without device, 09/29/22 25 seconds, 11/02/22 TUG 20 seconds                                                                      PATIENT EDUCATION: Education details: Progress towards goals, POC Person educated: Patient and Spouse Education method: Programmer, multimedia, Facilities manager, and Verbal cues  Education comprehension: verbalized understanding, returned demonstration, verbal cues required, and needs further education  HOME EXERCISE PROGRAM: PWR! moves  GOALS: Goals reviewed with patient? Yes  SHORT TERM GOALS: Target date: 12/22/22  Perform HEP for PWR! Moves with cues from family. Baseline: Pt's wife, [name], notes walking and hitting beach ball as main HEP. Goal status: GOAL MET, 12/20/2022   2. Decrease 5 x STS to < 35 sec to demonstrate improved functional strength  Baseline: 47 seconds on 5/21 per note; 34.5 sec UE support 12/20/2022  Goal status: GOAL MET, 12/20/2022   3. Perform sit <> stand transfers from average chair height with Supervision.  Baseline: CGA  12/20/2022; (01/10/23) CGA  Goal status: NOT MET  LONG TERM GOALS: Target date: 04/22/23  Patient will perform HEP progression with caregiver assist. Baseline: has minimal HEP Goal status: ONGOING  2.  Decrease 5 x STS to < 25 sec to demonstrate improved functional strength Baseline: 47 seconds on 5/21 per note; (01/10/23) 36 sec w/ cues for speed/sequence>30.38 sec with BUE supprot Goal status: PARTIALLY MET, 01/24/2023  3. Improve quality of gait to be able to ambulate x 100 feet with supervision. Baseline: CGA with cues  Goal status: REVISED  4.  Patient will perform his bed mobility with MI including rolling, sit<>supine, and scooting in bed. Baseline: on hard surface mat he is independent but on soft bed at home he struggles, wife reports only needs a hand to get up now 11/02/22; (01/10/23) modified indep.  Goal status: MET  5.  Patient will demonstrate floor<>stand with CGA and verbal cues for patient's wife to be able to assist. Baseline:  Has to call 9-1-1; +2 max assist floor>stand with UE support 01/19/2023 Goal status: GOAL NOT  MET 01/19/2023 **UPDATED GOALS BELOW ** per recert 01/24/2023  SHORT TERM GOALS: Target date: 02/25/2023   Pt will perform HEP to maintain lower body strength, flexibility, posture with wife's assistance. Baseline:  Goal status: met 04/12/23  2. Pt will perform sit to stand, 8 of 10 reps with BUE support, with supervision, to maintain safety with transfers.  Baseline: min guard  Goal status: 02/07/23-CGA from elevated surface and max VC to lean forward, ongoing   LONG TERM GOALS: Target date: 04/22/2023   Patient will perform HEP progression to maintain lower body strength, flexibility, posture with wife's assistance. Baseline:  Goal status: ongoing 04/12/23  2.  Pt will ambulate 150-175 ft in 2 MWT with min guard assist, to preserve functional mobility within the home.  Baseline: Goal status:  INITIAL  3. Wife will verbalize tips for fall prevention education to minimize fall frequency Baseline:  Goal status: ongoing 04/12/23   ASSESSMENT:  CLINICAL IMPRESSION:  Patient is now with just his wife again, he really is seeming to try to help more with the transfers to not put as much stress on his wife as she recovers from a fall and a broken cervical vertebrae .  He is doing well, he did fatigue on the bike after the walks. OBJECTIVE IMPAIRMENTS: Abnormal gait, decreased activity tolerance, decreased balance, decreased cognition, decreased coordination, decreased endurance, decreased mobility, difficulty walking, decreased ROM, decreased strength, decreased safety awareness, impaired flexibility, impaired UE functional use, improper body mechanics, and postural dysfunction.   PLAN:  PT FREQUENCY: 1x/week  PT DURATION: 12 weeks  PLANNED INTERVENTIONS: Therapeutic exercises, Therapeutic activity, Neuromuscular re-education, Balance training, Gait training, Patient/Family education, Self Care, Joint mobilization, Stair training, Moist heat, and Manual therapy  PLAN FOR NEXT SESSION:     Did renewal today and will cotntinue to try to help him and his wife stay independent and functioning at home  Patient Details  Name: William [name] MRN: [id number] Date of Birth: [birth year] Referring Provider:  Donato Schultz, *  Encounter Date: 05/31/2023   Jearld Lesch, PT 05/31/2023, 1:57 PM  [facility] [facility] [address] [facility] [place], [place], [address] Phone: [phone]   Fax:  [phone]

## 2023-06-02 ENCOUNTER — Other Ambulatory Visit: Payer: Self-pay

## 2023-06-02 ENCOUNTER — Other Ambulatory Visit (HOSPITAL_BASED_OUTPATIENT_CLINIC_OR_DEPARTMENT_OTHER): Payer: Self-pay

## 2023-06-02 DIAGNOSIS — R3915 Urgency of urination: Secondary | ICD-10-CM | POA: Diagnosis not present

## 2023-06-02 DIAGNOSIS — N401 Enlarged prostate with lower urinary tract symptoms: Secondary | ICD-10-CM | POA: Diagnosis not present

## 2023-06-02 DIAGNOSIS — C678 Malignant neoplasm of overlapping sites of bladder: Secondary | ICD-10-CM | POA: Diagnosis not present

## 2023-06-02 MED ORDER — TAMSULOSIN HCL 0.4 MG PO CAPS
0.4000 mg | ORAL_CAPSULE | Freq: Every day | ORAL | 3 refills | Status: DC
  Filled 2023-07-03: qty 90, 90d supply, fill #0
  Filled 2023-09-30: qty 90, 90d supply, fill #1
  Filled 2023-12-29: qty 90, 90d supply, fill #2
  Filled 2024-03-30: qty 90, 90d supply, fill #3

## 2023-06-06 ENCOUNTER — Other Ambulatory Visit (HOSPITAL_BASED_OUTPATIENT_CLINIC_OR_DEPARTMENT_OTHER): Payer: Self-pay

## 2023-06-06 ENCOUNTER — Other Ambulatory Visit: Payer: Self-pay

## 2023-06-06 ENCOUNTER — Ambulatory Visit: Payer: Self-pay | Admitting: Licensed Clinical Social Worker

## 2023-06-06 ENCOUNTER — Encounter: Payer: Self-pay | Admitting: Family Medicine

## 2023-06-06 MED ORDER — HYDRALAZINE HCL 25 MG PO TABS
25.0000 mg | ORAL_TABLET | Freq: Three times a day (TID) | ORAL | 0 refills | Status: DC
  Filled 2023-06-06: qty 270, 90d supply, fill #0

## 2023-06-06 MED ORDER — CLOPIDOGREL BISULFATE 75 MG PO TABS
75.0000 mg | ORAL_TABLET | Freq: Every day | ORAL | 0 refills | Status: DC
  Filled 2023-06-06: qty 90, 90d supply, fill #0

## 2023-06-06 NOTE — Patient Outreach (Signed)
  Care Coordination   Follow Up Visit Note   06/06/2023 Name: William [name] MRN: [id number] DOB: [birth year]  William [name] is a 82 y.o. year old male who sees [name], [name], DO for primary care. I spoke with  [name] / [name], spouse of client, via phone today.  What matters to the patients health and wellness today?  Patient needs help with ADLs and with daily functioning     Goals Addressed             This Visit's Progress    Patient needs help with ADLs and with daily funtioning       Interventions:  Spoke with [name], spouse of client, via phone about status of client Discussed medication procurement of client. Client has his prescribed medications [name] said she thought client was fairly stable at present. Discussed pain issues of client. Discussed upcoming client medical appointments.  [name] provides transport assistance for client for client to go to and from client medical appointments Discussed program support with RN, LCSW, Pharmacist [name] asked if LCSW could call her back at a later time for further discuss client needs. LCSW agreed to call [name] in 5 weeks to assess client needs at that time. [name] agreed to this plan. Encouraged [name] to call LCSW as needed at [phone] to discuss SW needs of client        SDOH assessments and interventions completed:  Yes  SDOH Interventions Today    Flowsheet Row Most Recent Value  SDOH Interventions   Physical Activity Interventions Other (Comments)  [may have decreased activities]  Stress Interventions Other (Comment)  [client has stress in managing medical needs]        Care Coordination Interventions:  Yes, provided    Interventions Today    Flowsheet Row Most Recent Value  Chronic Disease   Chronic disease during today's visit Other  [spoke with spouse of client, about client needs]  General Interventions   General Interventions Discussed/Reviewed General  Interventions Discussed, Community Resources  Education Interventions   Education Provided Provided Education  Provided Verbal Education On [employer]  Mental Health Interventions   Mental Health Discussed/Reviewed Coping Strategies  Nutrition Interventions   Nutrition Discussed/Reviewed Nutrition Discussed  Pharmacy Interventions   Pharmacy Dicussed/Reviewed Pharmacy Topics Discussed  Safety Interventions   Safety Discussed/Reviewed Fall Risk       Follow up plan: Follow up call scheduled for 07/19/23 at 10:30 AM     Encounter Outcome:  Patient Visit Completed   Kelton Pillar.Tennile Styles MSW, LCSW Licensed Visual merchandiser [facility] Care Management [phone]

## 2023-06-06 NOTE — Patient Instructions (Signed)
Visit Information  Thank you for taking time to visit with me today. Please don't hesitate to contact me if I can be of assistance to you.   Following are the goals we discussed today:   Goals Addressed             This Visit's Progress    Patient needs help with ADLs and with daily funtioning       Interventions:  Spoke with William [name], spouse of client, via phone about status of client Discussed medication procurement of client. Client has his prescribed medications William [name] said she thought client was fairly stable at present. Discussed pain issues of client. Discussed upcoming client medical appointments.  William [name] provides transport assistance for client for client to go to and from client medical appointments Discussed program support with RN, LCSW, Pharmacist William [name] asked if LCSW could call her back at a later time for further discuss client needs. LCSW agreed to call William [name] in 5 weeks to assess client needs at that time. William [name] agreed to this plan. Encouraged William [name] to call LCSW as needed at [phone] to discuss SW needs of client        Our next appointment is by telephone on 07/19/23 at 10:30 AM   Please call the care guide team at [phone] if you need to cancel or reschedule your appointment.   If you are experiencing a Mental Health or Behavioral Health Crisis or need someone to talk to, please go to [facility] [address] [place] [phone])   The patient/ spouse of patient,  verbalized understanding of instructions, educational materials, and care plan provided today and DECLINED offer to receive copy of patient instructions, educational materials, and care plan.   The patient / spouse of patient, has been provided with contact information for the care management team and has been advised to call with any health related questions or concerns.   William [name].William [name] MSW, LCSW Licensed Visual merchandiser [facility]  Care Management [phone]

## 2023-06-09 ENCOUNTER — Ambulatory Visit: Payer: PPO | Admitting: Podiatry

## 2023-06-09 ENCOUNTER — Encounter: Payer: Self-pay | Admitting: Podiatry

## 2023-06-09 DIAGNOSIS — M79676 Pain in unspecified toe(s): Secondary | ICD-10-CM

## 2023-06-09 DIAGNOSIS — Z7901 Long term (current) use of anticoagulants: Secondary | ICD-10-CM | POA: Diagnosis not present

## 2023-06-12 NOTE — Progress Notes (Signed)
Subjective: Chief Complaint  Patient presents with   RFC    RM#12 RFC patient states big toe right foot is very sensitive.     82 y.o. returns the office today for painful, elongated, thickened toenails which he cannot trim himself.  His toenails are sensitive particular the right hallux.  Denies any associated drainage or redness from these sites.  No open lesions.  No other concerns today.  He is on Plavix.  PCP: Donato Schultz, DO Last seen February 24, 2023  Objective: NAD, presents with his wife DP/PT pulses palpable, CRT less than 3 seconds  Nails hypertrophic, dystrophic, elongated, brittle, discolored x 10.  Incurvation of the right hallux toenail without any edema, erythema or signs of infection.  There is tenderness overlying the nails 1-5 bilaterally.  No swelling around the nails, redness or drainage or any signs of infection. Hammertoes present No open lesions No pain with calf compression, swelling, warmth, erythema.  Assessment: Patient presents with symptomatic onychomycosis  Plan: -Treatment options including alternatives, risks, complications were discussed -Nails debrided x 10 without any complications or bleeding.  Multiple debridement of right hallux ingrown toenail without any complications or bleeding today.  Monitoring signs or symptoms of infection. -Advised continued foot checks  Return in about 3 months (around 09/07/2023).  Vivi Barrack DPM

## 2023-06-13 ENCOUNTER — Ambulatory Visit: Payer: PPO | Admitting: Podiatry

## 2023-06-14 ENCOUNTER — Encounter: Payer: Self-pay | Admitting: Physical Therapy

## 2023-06-14 ENCOUNTER — Ambulatory Visit: Payer: PPO | Admitting: Physical Therapy

## 2023-06-14 DIAGNOSIS — M6281 Muscle weakness (generalized): Secondary | ICD-10-CM

## 2023-06-14 DIAGNOSIS — R296 Repeated falls: Secondary | ICD-10-CM

## 2023-06-14 DIAGNOSIS — I69354 Hemiplegia and hemiparesis following cerebral infarction affecting left non-dominant side: Secondary | ICD-10-CM

## 2023-06-14 DIAGNOSIS — R262 Difficulty in walking, not elsewhere classified: Secondary | ICD-10-CM

## 2023-06-14 DIAGNOSIS — Z9181 History of falling: Secondary | ICD-10-CM

## 2023-06-14 DIAGNOSIS — R2689 Other abnormalities of gait and mobility: Secondary | ICD-10-CM

## 2023-06-14 NOTE — Therapy (Signed)
 OUTPATIENT PHYSICAL THERAPY NEURO TREATMENT  Patient Name: William [name] MRN: [id number] DOB:1941/01/16, 82 y.o., male Today's Date: 06/14/2023  PCP: William Cyndee [name]  DO REFERRING PROVIDER: same   END OF SESSION:  PT End of Session - 06/14/23 1447     Visit Number 62    Number of Visits 66    Date for PT Re-Evaluation 06/17/23    Authorization Type HTA    PT Start Time 1443    PT Stop Time 1527    PT Time Calculation (min) 44 min    Activity Tolerance Patient tolerated treatment well    Behavior During Therapy [facility] for tasks assessed/performed                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   HTN (hypertension) 07/19/2015   Hyperlipidemia    Major neurocognitive disorder 01/09/2014   Mild, related to stroke history   Nocturia    Renal insufficiency 06/25/2013   S/P carotid endarterectomy    BILATERAL ICA--  PATENT PER DUPLEX  05-19-2012   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/26/2017   Right calf   Urinary frequency    Vitamin D  deficiency    Past Surgical History:  Procedure Laterality Date   APPENDECTOMY  AS CHILD   CARDIOVASCULAR STRESS TEST  03-27-2012  DR CRENSHAW   LOW RISK LEXISCAN  STUDY-- PROBABLE NORMAL PERFUSION AND SOFT TISSUE ATTENUATION/  NO ISCHEMIA/ EF 51%   CAROTID ENDARTERECTOMY Bilateral LEFT  11-12-2008  DR GREG HAYES   RIGHT ICA  2006  ([facility])   CHOLECYSTECTOMY N/A 02/23/2022   Procedure: LAPAROSCOPIC CHOLECYSTECTOMY;  Surgeon: [name], MD;  Location: [facility];  Service: General;  Laterality: N/A;    CYSTOSCOPY W/ RETROGRADES Bilateral 06/22/2021   Procedure: CYSTOSCOPY WITH RETROGRADE PYELOGRAM;  Surgeon: [name], MD;  Location: [facility];  Service: Urology;  Laterality: Bilateral;   CYSTOSCOPY WITH LITHOLAPAXY N/A 02/26/2013   Procedure: CYSTOSCOPY WITH LITHOLAPAXY;  Surgeon: Senior Matilda, MD;  Location: [facility];  Service: Urology;  Laterality: N/A;   ENDOSCOPIC RETROGRADE CHOLANGIOPANCREATOGRAPHY (ERCP) WITH PROPOFOL  N/A 02/22/2022   Procedure: ENDOSCOPIC RETROGRADE CHOLANGIOPANCREATOGRAPHY (ERCP) WITH PROPOFOL ;  Surgeon: [name], MD;  Location: [facility];  Service: Gastroenterology;  Laterality: N/A;   EYE SURGERY  Jan. 2016   cataract surgery both eyes   INGUINAL HERNIA REPAIR Right 11-08-2006   IR KYPHO EA ADDL LEVEL THORACIC OR LUMBAR  02/12/2021   IR RADIOLOGIST EVAL & MGMT  02/18/2021   MASS EXCISION N/A 03/03/2016   Procedure: EXCISION OF BACK  MASS;  Surgeon: [name], MD;  Location: [facility];  Service: General;  Laterality: N/A;   MOHS SURGERY Left 1/ 2016   [name]-- Basal cell   PROSTATE SURGERY     REMOVAL OF STONES  02/22/2022   Procedure: REMOVAL OF STONES;  Surgeon: [name], MD;  Location: [facility] ENDOSCOPY;  Service: Gastroenterology;;   [name]  02/22/2022   Procedure:  SPHINCTEROTOMY;  Surgeon: [name], MD;  Location: [facility] ENDOSCOPY;  Service: Gastroenterology;;   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 N/A 06/22/2021   Procedure: TRANSURETHRAL RESECTION OF BLADDER TUMOR;  Surgeon: [name], MD;  Location: [facility];  Service: Urology;  Laterality: N/A;   TRANSURETHRAL RESECTION OF PROSTATE N/A 02/26/2013   Procedure: TRANSURETHRAL RESECTION OF THE PROSTATE WITH GYRUS INSTRUMENTS;  Surgeon: Senior Matilda, MD;  Location: [facility];  Service: Urology;  Laterality: N/A;   TRANSURETHRAL RESECTION OF PROSTATE N/A 06/22/2021    Procedure: TRANSURETHRAL RESECTION OF THE PROSTATE (TURP);  Surgeon: [name], MD;  Location: [facility];  Service: Urology;  Laterality: N/A;   Patient Active Problem List   Diagnosis Date Noted   Acute encephalopathy 04/18/2023   Parkinson's disease (HCC) 04/18/2023   History of CVA with residual deficit 04/18/2023   Chronic kidney disease, stage 3b (HCC) 04/18/2023   Dementia with behavioral disturbance (HCC) 04/18/2023   Anemia of chronic disease 04/18/2023   Laceration of right upper extremity 02/24/2023   Dermatitis 02/24/2023   Lung nodules 02/24/2023   Dysuria 08/03/2022   Nonintractable headache 07/01/2022   Bilateral impacted cerumen 06/24/2022   Rash 06/15/2022   Postoperative ileus (HCC) 02/27/2022   Ileus, postoperative (HCC) 02/26/2022   Choledocholithiasis 02/19/2022   DNR (do not resuscitate) 02/19/2022   Left elbow pain 01/26/2022   Mid back pain on left side 01/26/2022   Rib pain 01/26/2022   Acute pain of left shoulder 11/12/2021   Leukocytes in urine 11/12/2021   Urinary frequency 11/12/2021   Thrush 10/08/2021   Hemiplegia, 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   Insomnia    Prediabetes    Acute 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 (HCC)    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   Transaminitis 07/27/2021   UTI (urinary tract infection) 07/27/2021   CVA (cerebral vascular accident) (HCC) 07/27/2021   Fall 07/27/2021   Hyperglycemia 07/27/2021   Cholelithiasis 07/27/2021   Hypoxia 07/27/2021   Nausea and vomiting 07/27/2021   Acute metabolic encephalopathy 07/27/2021   Normocytic anemia 07/27/2021   Chronic back pain 07/27/2021   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   Closed fracture of first lumbar vertebra with routine healing 02/03/2021   Closed fracture of multiple ribs 11/18/2020   Anxiety 01/29/2020   Leg pain, bilateral 01/29/2020    Ingrown toenail 07/13/2019   Lumbar spondylosis 05/02/2018   Pain in left knee 03/09/2018   Osteoarthritis of left hip 01/16/2018   Trochanteric bursitis of left hip 01/16/2018   Preventative health care 09/26/2017   HTN (hypertension) 07/19/2015   Hyperlipidemia 07/19/2015   Great toe pain 02/11/2014   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   Obesity (BMI 30-39.9) 06/25/2013   Renal insufficiency 06/25/2013   Weakness of left arm 06/25/2013   Sebaceous cyst 03/03/2011   Sprain of lumbar region 07/31/2010   Rib pain, left 08/29/2009   Carotid artery stenosis, asymptomatic, bilateral 05/02/2009   Eczema, atopic 05/31/2008   Vitamin D  deficiency 03/01/2008   BPH (benign prostatic hyperplasia) 08/06/2007   Fasting hyperglycemia 12/21/2006   History of right MCA infarct 2006    ONSET DATE: 06/29/22 REFERRING DIAG:  Diagnosis  I63.9 (ICD-10-CM) - Cerebrovascular accident (CVA), unspecified mechanism (HCC)  W19.XXXD (ICD-10-CM) - Fall, subsequent encounter    THERAPY DIAG:  Muscle weakness (generalized)  Other abnormalities of gait and mobility  Hemiplegia and hemiparesis following  cerebral infarction affecting left non-dominant side (HCC)  History of falling  Difficulty in walking, not elsewhere classified  Repeated falls  Rationale for Evaluation and Treatment: Rehabilitation  SUBJECTIVE:                                                                                                                                                                                         SUBJECTIVE STATEMENT:  No falls, no issues reported  PERTINENT HISTORY:  William [name] is a 82 year old man with dementia, CKD, HTN, CAD, HLD and history of CVA  (2006, 2023), Parkinson's  PAIN:  Are you having pain? Faces/behavior scale- mild pain BLEs   PRECAUTIONS: Fall  WEIGHT BEARING RESTRICTIONS: No  FALLS: Has patient fallen in last 6 months? No  LIVING ENVIRONMENT: Lives  with: lives with their family and lives with their spouse Lives in: House/apartment Stairs:  1 brick high step Has following equipment at home: Environmental Consultant - 2 wheeled, Wheelchair (manual), Shower bench, bed side commode, Grab bars, and hospital bed, sliding pad for car  PLOF: Independent with basic ADLs and Independent with household mobility without device  PATIENT GOALS: Walk around the block with his wife. Be able to use the dining room chair rather than have to use the W/C. Increased I with bed mobility and simple tasks at home like make some coffee or brush his teeth, Step over tub to shower, has bench, but he can't really use it. Up and down the one step to get to back deck. 11/23/22: Patient's wife Devere updated his goals today: Walk around store (gets distracted), swing a golf club again (thinking of driving range), get up on his own, walk without leaning forward, more recognition of the left side (from CVA), not need gait belt, OT-- be able to get dressed more on his own, navigate the newspaper.  OBJECTIVE:  TREATMENT: 06/14/23 Nustep level 5 x 7 minutes Bike level 4 x 6 minutes Gait with HHA and belt outside asphalt 200 feet rest then 300 feet Standing balance touching numbers on wall did miss some on the left side needed some cues, to look to the left and to not hold on, did right arm reaching and left arm reaching [employer] Side stepping, backward walking Gait outside in the grass x 200 feet  05/31/23 Nustep level 5 x 7 minutes Gait outside with HHA 200 feet, rest and then 300 feet. Standing ball toss Ball kicks 2.5# LAQ, marches Side stepping, forward and backward walking In pbars hip abduction In pbars step turn reach On bike level 4 x 6 minutes Gai with HHA x 200 feet  05/26/23 Bike level 4 x 5 minutes Nustep level 5 x 5 minutes Side stepping, step turn reaches [employer], volley ball [employer] Walking 200 feet x 2 Sit to stand with verbal cues Gentle  Passive ROM left shoulder Red tband HS curls   05/17/23 Nustep level 5 x 5 mintues TUG 40 seconds, had a very difficult time getting up from sitting, needed cues 5XSTS: 75 seconds with CGA and a lot of verbal cues HHA walking with some cues 330 feet Standing marching, hip abduction In bars side stepping Ball toss, ball hits  Practiced sit to stands needing help with the left hand and getting his weight forward  04/12/23 Nustep level 5 x 5 minutes Gait outside around the back parking [place], rested and then from there around to the front of the building Standing ball toss, ball hits and then ball kicks, some SBA In pbars marching, abduction and extension In pbars step reach a lot of cues needed for this today Gait out the back door around in the grass and uneven terrain to the car  04/05/23 UBE level 4 x 6 minutes Gait HHA walking outside a lot of cues for step length around the building to the front door [employer] kicks Side step in pbars Step turn reach in pbars AAROM left shoulder' Isometrics left shoulder  03/29/23 HHA working on amb with increased stride and safety - pt tends to vary speeds and lean fwd cuing needed STS with wt ball press HHA 6 in step tap 20 x with HHA then 20x without - min A with several LOB Func balance ex carrying obj Batting ball in standing Nustep L 5 6 min PWR moves sitting and standing with cuing Boxing  03/24/23 NuStep L5x69mins UBE L2 x84mins each way  Side stepping Marches Hip abduction STS minA from elevated mat- does not stand all the way up and needs one hand push off  Seated ball hits ad kicks  Seated chest press with 3# WaTe 2x10  03/15/23 Nustep level 5 x 7 minutes Gait outside with CGA and HHA multiple rests and redirection for big step Stairs step over step up and down with cues and CGA Seated volleyball hands and then kicks Side stepping Marches Hip abduction Fast walking with HHA  PATIENT EDUCATION: Education  details: none new 02/16/23 Person educated: Patient and Spouse Education method: Explanation, Demonstration, Tactile cues, and Verbal cues Education comprehension: verbalized understanding, returned demonstration, verbal cues required, tactile cues required, and needs further education   ------------------------------------------------------------------------------------------------------- (Measures in this section from initial evaluation unless otherwise noted) DIAGNOSTIC FINDINGS:  MRI 2021 with degenerative changes in lumbar spine, L3-4 R subarticular stenosis COGNITION: Overall cognitive status: History of cognitive impairments - at baseline SENSATION: Not tested COORDINATION: Moderately impaired BLE MUSCLE LENGTH: Hamstrings: severely restricted B Thomas test: Severely restricted B. POSTURE: rounded shoulders, forward head, increased thoracic kyphosis, posterior pelvic tilt, and flexed trunk  LOWER EXTREMITY ROM:   BLE extremely stiff throughout, limited hip ROM in all planes, B knees limited in extension. LOWER EXTREMITY MMT:  3+/5 throughout. Unable to determine accuracy of MMT due to cognition. Functionally demosntrates poor coordinated activation and poor muscular endurance. BED MOBILITY: Min A for Supine<> Sit. Patient was using a ladder in his hospital bed and could move MI. TRANSFERS: Min A, mod VC, tends to lean back.  11/02/22 independent with use of hands on arm chair with a couple of tries CURB: Min A-Has one step out to his deck. STAIRS:  N/A  GAIT: Gait pattern: step to pattern, decreased arm swing- Right, decreased arm swing- Left, decreased step length- Right, decreased step length- Left, shuffling, festinating, trunk flexed, narrow BOS, poor foot clearance- Right, and poor foot clearance- Left Distance walked: 69' Assistive device utilized: Environmental Consultant - 2 wheeled Level of assistance: Occasional min A Comments: mod VC to stay close to walker, take longer steps. FUNCTIONAL  TESTS:  5 times sit to stand: 38.23  09/29/22  could not performs 5XSTS, 11/02/22 = 47 seconds with cues to use arms Timed up and go (TUG): 47.91  with RW, 07/21/22 TUG 29  seconds without device, 09/29/22 25 seconds, 11/02/22 TUG 20 seconds                                                                     PATIENT EDUCATION: Education details: Progress towards goals, POC Person educated: Patient and Spouse Education method: Programmer, Multimedia, Facilities Manager, and Verbal cues  Education comprehension: verbalized understanding, returned demonstration, verbal cues required, and needs further education  HOME EXERCISE PROGRAM: PWR! moves  GOALS: Goals reviewed with patient? Yes  SHORT TERM GOALS: Target date: 12/22/22  Perform HEP for PWR! Moves with cues from family. Baseline: Pt's wife, Devere, notes walking and hitting beach ball as main HEP. Goal status: GOAL MET, 12/20/2022   2. Decrease 5 x STS to < 35 sec to demonstrate improved functional strength  Baseline: 47 seconds on 5/21 per note; 34.5 sec UE support 12/20/2022  Goal status: GOAL MET, 12/20/2022   3. Perform sit <> stand transfers from average chair height with Supervision.  Baseline: CGA  12/20/2022; (01/10/23) CGA  Goal status: NOT MET  LONG TERM GOALS: Target date: 04/22/23  Patient will perform HEP progression with caregiver assist. Baseline: has minimal HEP Goal status: ONGOING  2.  Decrease 5 x STS to < 25 sec to demonstrate improved functional strength Baseline: 47 seconds on 5/21 per note; (01/10/23) 36 sec w/ cues for speed/sequence>30.38 sec with BUE supprot Goal status: PARTIALLY MET, 01/24/2023  3. Improve quality of gait to be able to ambulate x 100 feet with supervision. Baseline: CGA with cues  Goal status: REVISED  4.  Patient will perform his bed mobility with MI including rolling, sit<>supine, and scooting in bed. Baseline: on hard surface mat he is independent but on soft bed at home he struggles, wife reports only  needs a hand to get up now 11/02/22; (01/10/23) modified indep.  Goal status: MET  5.  Patient will demonstrate floor<>stand with CGA and verbal cues for patient's wife to be able to assist. Baseline:  Has to call 9-1-1; +2 max assist floor>stand with UE support 01/19/2023 Goal status: GOAL NOT MET 01/19/2023 **UPDATED GOALS BELOW ** per recert 01/24/2023  SHORT TERM GOALS: Target date: 02/25/2023   Pt will perform HEP to maintain lower body strength, flexibility, posture with wife's assistance. Baseline:  Goal status: met 04/12/23  2. Pt will perform sit to stand, 8 of 10 reps with BUE support, with supervision, to maintain safety with transfers.  Baseline: min guard  Goal status: 02/07/23-CGA from elevated surface and max VC to lean forward, ongoing   LONG TERM GOALS: Target date: 04/22/2023   Patient will perform HEP progression to maintain lower body  strength, flexibility, posture with wife's assistance. Baseline:  Goal status: ongoing 04/12/23  2.  Pt will ambulate 150-175 ft in 2 MWT with min guard assist, to preserve functional mobility within the home.  Baseline: Goal status:  INITIAL  3. Wife will verbalize tips for fall prevention education to minimize fall frequency Baseline:  Goal status: ongoing 04/12/23   ASSESSMENT:  CLINICAL IMPRESSION:  Patient has had no falls, reports that he is still doing wellspirng 3x/week, Has not done much walking at home.  I did more walking today and he did well, still will tend to take small shuffling steps but did not get to the point where he was really leaning forward.  Did well with the volley ball and ball kicks, did not need CGA for this today but had table behind him OBJECTIVE IMPAIRMENTS: Abnormal gait, decreased activity tolerance, decreased balance, decreased cognition, decreased coordination, decreased endurance, decreased mobility, difficulty walking, decreased ROM, decreased strength, decreased safety awareness, impaired flexibility,  impaired UE functional use, improper body mechanics, and postural dysfunction.   PLAN:  PT FREQUENCY: 1x/week  PT DURATION: 12 weeks  PLANNED INTERVENTIONS: Therapeutic exercises, Therapeutic activity, Neuromuscular re-education, Balance training, Gait training, Patient/Family education, Self Care, Joint mobilization, Stair training, Moist heat, and Manual therapy  PLAN FOR NEXT SESSION:    Did renewal today and will cotntinue to try to help him and his wife stay independent and functioning at home  Patient Details  Name: William [name] MRN: [id number] Date of Birth: 10/14/1940 Referring Provider:  [name] Jamee [name], *  Encounter Date: 06/14/2023   OBADIAH OZELL ORN, PT 06/14/2023, 2:47 PM  [facility] [address] [place], [place], [address] Phone: [phone]   Fax:  [phone]

## 2023-06-16 ENCOUNTER — Ambulatory Visit (INDEPENDENT_AMBULATORY_CARE_PROVIDER_SITE_OTHER): Payer: PPO | Admitting: Family Medicine

## 2023-06-16 ENCOUNTER — Encounter: Payer: Self-pay | Admitting: Family Medicine

## 2023-06-16 ENCOUNTER — Other Ambulatory Visit (HOSPITAL_BASED_OUTPATIENT_CLINIC_OR_DEPARTMENT_OTHER): Payer: Self-pay

## 2023-06-16 VITALS — BP 160/80 | HR 60 | Temp 98.1°F | Resp 18 | Ht 69.0 in

## 2023-06-16 DIAGNOSIS — R300 Dysuria: Secondary | ICD-10-CM | POA: Diagnosis not present

## 2023-06-16 DIAGNOSIS — J40 Bronchitis, not specified as acute or chronic: Secondary | ICD-10-CM

## 2023-06-16 DIAGNOSIS — H6122 Impacted cerumen, left ear: Secondary | ICD-10-CM

## 2023-06-16 LAB — POC URINALSYSI DIPSTICK (AUTOMATED)
Bilirubin, UA: NEGATIVE
Blood, UA: NEGATIVE
Glucose, UA: NEGATIVE
Ketones, UA: NEGATIVE
Nitrite, UA: NEGATIVE
Protein, UA: POSITIVE — AB
Spec Grav, UA: 1.01 (ref 1.010–1.025)
Urobilinogen, UA: 0.2 U/dL
pH, UA: 6.5 (ref 5.0–8.0)

## 2023-06-16 MED ORDER — AMOXICILLIN-POT CLAVULANATE 600-42.9 MG/5ML PO SUSR
ORAL | 0 refills | Status: DC
  Filled 2023-06-16: qty 150, 7d supply, fill #0

## 2023-06-16 NOTE — Progress Notes (Signed)
 Established Patient Office Visit  Subjective   Patient ID: William [name], male    DOB: [birth year]  Age: 83 y.o. MRN: [id number]  Chief Complaint  Patient presents with   Dysuria    HPI Discussed the use of AI scribe software for clinical note transcription with the patient, who gave verbal consent to proceed.  History of Present Illness   The patient, with a history of urinary tract infections, presents with increased drooling and nocturnal coughing with a gurgling sound. He reports dysuria and describes the sensation as painful. He denies abdominal pain, but there is a mention of increased sleepiness, with the patient sleeping almost all day on the previous two days.  In addition to these symptoms, the patient has been experiencing hearing difficulties, specifically stating that he can't hear anything out of one ear. This has been a persistent issue, with both ears reported as being impacted.  The patient's caregiver also mentions the presence of a healthcare worker provided by the [place] who assists with showers a couple of days a week. The patient's overall mood and behavior are described as crazy, but no further details are provided.      Patient Active Problem List   Diagnosis Date Noted   Bronchitis 06/16/2023   Impacted cerumen of left ear 06/16/2023   Acute encephalopathy 04/18/2023   Parkinson's disease (HCC) 04/18/2023   History of CVA with residual deficit 04/18/2023   Chronic kidney disease, stage 3b (HCC) 04/18/2023   Dementia with behavioral disturbance (HCC) 04/18/2023   Anemia of chronic disease 04/18/2023   Laceration of right upper extremity 02/24/2023   Dermatitis 02/24/2023   Lung nodules 02/24/2023   Dysuria 08/03/2022   Nonintractable headache 07/01/2022   Bilateral impacted cerumen 06/24/2022   Rash 06/15/2022   Postoperative ileus (HCC) 02/27/2022   Ileus, postoperative (HCC) 02/26/2022   Choledocholithiasis 02/19/2022   DNR (do not resuscitate)  02/19/2022   Left elbow pain 01/26/2022   Mid back pain on left side 01/26/2022   Rib pain 01/26/2022   Acute pain of left shoulder 11/12/2021   Leukocytes in urine 11/12/2021   Urinary frequency 11/12/2021   Thrush 10/08/2021   Hemiplegia, 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   Insomnia    Prediabetes    Acute 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 (HCC)    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   Transaminitis 07/27/2021   UTI (urinary tract infection) 07/27/2021   CVA (cerebral vascular accident) (HCC) 07/27/2021   Fall 07/27/2021   Hyperglycemia 07/27/2021   Cholelithiasis 07/27/2021   Hypoxia 07/27/2021   Nausea and vomiting 07/27/2021   Acute metabolic encephalopathy 07/27/2021   Normocytic anemia 07/27/2021   Chronic back pain 07/27/2021   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   Closed fracture of first lumbar vertebra with routine healing 02/03/2021   Closed fracture of multiple ribs 11/18/2020   Anxiety 01/29/2020   Leg pain, bilateral 01/29/2020   Ingrown toenail 07/13/2019   Lumbar spondylosis 05/02/2018   Pain in left knee 03/09/2018   Osteoarthritis of left hip 01/16/2018   Trochanteric bursitis of left hip 01/16/2018   Preventative health care 09/26/2017   HTN (hypertension) 07/19/2015   Hyperlipidemia 07/19/2015   Great toe pain 02/11/2014   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   Obesity (BMI 30-39.9) 06/25/2013   Renal insufficiency 06/25/2013   Weakness of left arm 06/25/2013   Sebaceous cyst 03/03/2011   Sprain of lumbar region  07/31/2010   Rib pain, left 08/29/2009   Carotid artery stenosis, asymptomatic, bilateral 05/02/2009   Eczema, atopic 05/31/2008   Vitamin D  deficiency 03/01/2008   BPH (benign prostatic hyperplasia) 08/06/2007   Fasting hyperglycemia 12/21/2006   History of right MCA infarct 2006   Past Medical History:  Diagnosis Date    Arthritis    low back   Basal cell carcinoma of face 12/26/2014   Mohs surgery jan 2016    Bladder stone    BPH (benign prostatic hyperplasia) 08/06/2007   Chronic kidney disease 2014   Stage III   Closed fracture of fifth metacarpal bone 05/15/2015   Eczema    Fasting hyperglycemia 12/21/2006   GERD (gastroesophageal reflux disease)    History of right MCA infarct 06/14/2004   HTN (hypertension) 07/19/2015   Hyperlipidemia    Major neurocognitive disorder 01/09/2014   Mild, related to stroke history   Nocturia    Renal insufficiency 06/25/2013   S/P carotid endarterectomy    BILATERAL ICA--  PATENT PER DUPLEX  05-19-2012   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   Right calf   Urinary frequency    Vitamin D  deficiency    Past Surgical History:  Procedure Laterality Date   APPENDECTOMY  AS CHILD   CARDIOVASCULAR STRESS TEST  03-27-2012  DR CRENSHAW   LOW RISK LEXISCAN  STUDY-- PROBABLE NORMAL PERFUSION AND SOFT TISSUE ATTENUATION/  NO ISCHEMIA/ EF 51%   CAROTID ENDARTERECTOMY Bilateral LEFT  11-12-2008  DR GREG HAYES   RIGHT ICA  2006  ([facility])   CHOLECYSTECTOMY N/A 02/23/2022   Procedure: LAPAROSCOPIC CHOLECYSTECTOMY;  Surgeon: Lyndel Deward PARAS, MD;  Location: [facility];  Service: General;  Laterality: N/A;   CYSTOSCOPY W/ RETROGRADES Bilateral 06/22/2021   Procedure: CYSTOSCOPY WITH RETROGRADE PYELOGRAM;  Surgeon: [name], MD;  Location: [facility];  Service: Urology;  Laterality: Bilateral;   CYSTOSCOPY WITH LITHOLAPAXY N/A 02/26/2013   Procedure: CYSTOSCOPY WITH LITHOLAPAXY;  Surgeon: Senior Matilda, MD;  Location: [facility];  Service: Urology;  Laterality: N/A;   ENDOSCOPIC RETROGRADE CHOLANGIOPANCREATOGRAPHY (ERCP) WITH PROPOFOL  N/A 02/22/2022   Procedure: ENDOSCOPIC RETROGRADE CHOLANGIOPANCREATOGRAPHY (ERCP) WITH PROPOFOL ;  Surgeon: [name], MD;  Location: [facility] ENDOSCOPY;  Service:  Gastroenterology;  Laterality: N/A;   EYE SURGERY  Jan. 2016   cataract surgery both eyes   INGUINAL HERNIA REPAIR Right 11-08-2006   IR KYPHO EA ADDL LEVEL THORACIC OR LUMBAR  02/12/2021   IR RADIOLOGIST EVAL & MGMT  02/18/2021   MASS EXCISION N/A 03/03/2016   Procedure: EXCISION OF BACK  MASS;  Surgeon: [name], MD;  Location: [facility];  Service: General;  Laterality: N/A;   MOHS SURGERY Left 1/ 2016   [name]-- Basal cell   PROSTATE SURGERY     REMOVAL OF STONES  02/22/2022   Procedure: REMOVAL OF STONES;  Surgeon: [name], MD;  Location[facility] ENDOSCOPY;  Service: Gastroenterology;;   [name]  02/22/2022   Procedure: [name];  Surgeon: [name], MD;  Location: [facility] ENDOSCOPY;  Service: Gastroenterology;;   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 N/A 06/22/2021   Procedure: TRANSURETHRAL RESECTION OF BLADDER TUMOR;  Surgeon: [name], MD;  Location: [facility];  Service: Urology;  Laterality: N/A;   TRANSURETHRAL RESECTION OF PROSTATE N/A 02/26/2013   Procedure: TRANSURETHRAL RESECTION OF THE PROSTATE WITH GYRUS INSTRUMENTS;  Surgeon: Senior Matilda, MD;  Location: [facility];  Service:  Urology;  Laterality: N/A;   TRANSURETHRAL RESECTION OF PROSTATE N/A 06/22/2021   Procedure: TRANSURETHRAL RESECTION OF THE PROSTATE (TURP);  Surgeon: [name], MD;  Location: [facility];  Service: Urology;  Laterality: N/A;   Social History   Tobacco Use   Smoking status: Former    Current packs/day: 0.00    Average packs/day: 2.0 packs/day for 40.0 years (80.0 ttl pk-yrs)    Types: Cigarettes    Start date: 02/15/1965    Quit date: 02/15/2005    Years since quitting: 18.3   Smokeless tobacco: Never  Vaping Use   Vaping status: Never Used  Substance Use Topics   Alcohol use: Not Currently   Drug use: No   Social History   Socioeconomic History   Marital status: Married     Spouse name: Not on file   Number of children: 2   Years of education: 12   Highest education level: Associate degree: occupational, scientist, product/process development, or vocational program  Occupational History    Employer: Retired  Tobacco Use   Smoking status: Former    Current packs/day: 0.00    Average packs/day: 2.0 packs/day for 40.0 years (80.0 ttl pk-yrs)    Types: Cigarettes    Start date: 02/15/1965    Quit date: 02/15/2005    Years since quitting: 18.3   Smokeless tobacco: Never  Vaping Use   Vaping status: Never Used  Substance and Sexual Activity   Alcohol use: Not Currently   Drug use: No   Sexual activity: Not Currently    Partners: Female  Other Topics Concern   Not on file  Social History Narrative   Exercise--  Walks with assistance -- using wheelchair more than the walker       LIves with wife , no stairs in home, caffeine - one cup coffee day, exercise - not much, Right handed, 12th grade, retired      One story home   Social Drivers of Health   Financial Resource Strain: Medium Risk (06/14/2023)   Overall Financial Resource Strain (CARDIA)    Difficulty of Paying Living Expenses: Somewhat hard  Food Insecurity: No Food Insecurity (06/14/2023)   Hunger Vital Sign    Worried About Running Out of Food in the Last Year: Never true    Ran Out of Food in the Last Year: Never true  Transportation Needs: No Transportation Needs (06/14/2023)   PRAPARE - Administrator, Civil Service (Medical): No    Lack of Transportation (Non-Medical): No  Physical Activity: Inactive (06/14/2023)   Exercise Vital Sign    Days of Exercise per Week: 0 days    Minutes of Exercise per Session: 10 min  Stress: Stress Concern Present (06/14/2023)   [employer] of Occupational Health - Occupational Stress Questionnaire    Feeling of Stress : To some extent  Social Connections: Unknown (06/14/2023)   Social Connection and Isolation Panel [NHANES]    Frequency of Communication with  Friends and Family: More than three times a week    Frequency of Social Gatherings with Friends and Family: More than three times a week    Attends Religious Services: Patient declined    Database Administrator or Organizations: No    Attends Banker Meetings: Not on file    Marital Status: Married  Intimate Partner Violence: Not At Risk (04/19/2023)   Humiliation, Afraid, Rape, and Kick questionnaire    Fear of Current or Ex-Partner: No    Emotionally  Abused: No    Physically Abused: No    Sexually Abused: No   Family Status  Relation Name Status   Mother  Deceased at age 64   Father  Deceased at age 39   Sister  Alive   Sister  Alive   Sister  Deceased  No partnership data on file   Family History  Problem Relation Age of Onset   Heart disease Mother        CHF   Bipolar disorder Mother    Heart disease Father        CHF   Allergies  Allergen Reactions   Bee Venom Anaphylaxis   Strawberry Extract Hives   Latex Itching   Zetia  [Ezetimibe ] Other (See Comments)    Intolerance    Adhesive [Tape] Other (See Comments)    blisters   Statins Other (See Comments)    myalgias      Review of Systems  Constitutional:  Positive for malaise/fatigue. Negative for fever.  HENT:  Negative for congestion.   Eyes:  Negative for blurred vision.  Respiratory:  Negative for cough and shortness of breath.   Cardiovascular:  Negative for chest pain, palpitations and leg swelling.  Gastrointestinal:  Negative for abdominal pain, blood in stool, nausea and vomiting.  Genitourinary:  Positive for dysuria. Negative for frequency.  Musculoskeletal:  Negative for back pain and falls.  Skin:  Negative for rash.  Neurological:  Negative for dizziness, loss of consciousness and headaches.  Endo/Heme/Allergies:  Negative for environmental allergies.  Psychiatric/Behavioral:  Negative for depression. The patient is not nervous/anxious.       Objective:     BP (!) 160/80 (BP  Location: Right Arm, Patient Position: Sitting, Cuff Size: Normal)   Pulse 60   Temp 98.1 F (36.7 C) (Oral)   Resp 18   Ht 5' 9 (1.753 m)   SpO2 100%   BMI 24.42 kg/m  BP Readings from Last 3 Encounters:  06/16/23 (!) 160/80  05/10/23 (!) 165/66  04/30/23 112/81   Wt Readings from Last 3 Encounters:  05/10/23 165 lb 5.5 oz (75 kg)  04/30/23 165 lb 5.5 oz (75 kg)  04/18/23 166 lb 10.7 oz (75.6 kg)   SpO2 Readings from Last 3 Encounters:  06/16/23 100%  05/10/23 98%  04/30/23 100%      Physical Exam HENT:     Right Ear: There is impacted cerumen.     Left Ear: There is impacted cerumen.     Ears:     Comments: Attempted to irrigate but it was causing too much pain Pt will use debrox and try again in 1-2 weeks      Results for orders placed or performed in visit on 06/16/23  POCT Urinalysis Dipstick (Automated)  Result Value Ref Range   Color, UA yellow    Clarity, UA hazy    Glucose, UA Negative Negative   Bilirubin, UA negative    Ketones, UA negative    Spec Grav, UA 1.010 1.010 - 1.025   Blood, UA negative    pH, UA 6.5 5.0 - 8.0   Protein, UA Positive (A) Negative   Urobilinogen, UA 0.2 0.2 or 1.0 E.U./dL   Nitrite, UA negative    Leukocytes, UA Trace (A) Negative    Last CBC Lab Results  Component Value Date   WBC 6.7 05/10/2023   HGB 12.1 (L) 05/10/2023   HCT 36.1 (L) 05/10/2023   MCV 99.7 05/10/2023   MCH 33.4 05/10/2023  RDW 13.2 05/10/2023   PLT 149 (L) 05/10/2023   Last metabolic panel Lab Results  Component Value Date   GLUCOSE 117 (H) 05/10/2023   NA 134 (L) 05/10/2023   K 4.7 05/10/2023   CL 103 05/10/2023   CO2 22 05/10/2023   BUN 24 (H) 05/10/2023   CREATININE 1.88 (H) 05/10/2023   GFRNONAA 36 (L) 05/10/2023   CALCIUM  8.7 (L) 05/10/2023   PHOS 3.4 03/03/2022   PROT 6.0 (L) 05/10/2023   ALBUMIN 3.4 (L) 05/10/2023   LABGLOB 3.2 04/25/2019   AGRATIO 1.3 04/25/2019   BILITOT 0.6 05/10/2023   ALKPHOS 111 05/10/2023   AST  17 05/10/2023   ALT <5 05/10/2023   ANIONGAP 9 05/10/2023   Last lipids Lab Results  Component Value Date   CHOL 178 04/18/2023   HDL 51 04/18/2023   LDLCALC 112 (H) 04/18/2023   TRIG 76 04/18/2023   CHOLHDL 3.5 04/18/2023   Last hemoglobin A1c Lab Results  Component Value Date   HGBA1C 4.9 04/18/2023   Last thyroid  functions Lab Results  Component Value Date   TSH 0.53 02/24/2023   Last vitamin D  Lab Results  Component Value Date   VD25OH 46.2 05/18/2022   Last vitamin B12 and Folate Lab Results  Component Value Date   VITAMINB12 203 04/19/2023   FOLATE 7.0 03/01/2022      The ASCVD Risk score (Arnett DK, et al., 2019) failed to calculate for the following reasons:   The 2019 ASCVD risk score is only valid for ages 15 to 29   Risk score cannot be calculated because patient has a medical history suggesting prior/existing ASCVD    Assessment & Plan:   Problem List Items Addressed This Visit       Unprioritized   Dysuria - Primary   Relevant Orders   POCT Urinalysis Dipstick (Automated) (Completed)   Urine Culture   Impacted cerumen of left ear   Debrox daily for 1-2 weeks       Bronchitis   Relevant Medications   amoxicillin -clavulanate (AUGMENTIN  ES-600) 600-42.9 MG/5ML suspension   Other Relevant Orders   DG Chest 2 View  Assessment and Plan    Upper Respiratory Infection   He exhibits increased drooling, nocturnal coughing with gurgling sounds, and increased sleep but no fever. Lungs are clear on examination. We will prescribe Augmentin  (amoxicillin /clavulanate) in liquid form and avoid x-rays unless symptoms worsen, in line with his preference to minimize radiation exposure. A chest x-ray will be ordered if there is no improvement. He should call the on-call doctor if symptoms worsen.  Urinary Tract Infection (UTI)   He presents with dysuria and bacteriuria but has no antibiotic allergies. Augmentin , already prescribed for his respiratory symptoms,  will also cover the urinary symptoms. We will culture a urine sample, expecting results in 48 hours, and he should call the on-call doctor if symptoms worsen or if culture results necessitate a change in treatment.  Impacted Earwax   He has bilateral earwax impaction causing hearing difficulty and prefers in-office removal. We will perform earwax removal in the office.  Follow-up   We will monitor symptoms and advise him to call the on-call doctor if there is no improvement or if culture results indicate a need for a change in treatment. A follow-up with an x-ray is planned if symptoms do not improve.        Return in about 2 weeks (around 06/30/2023), or if symptoms worsen or fail to improve.  Dontavious Emily R Lowne Chase, DO

## 2023-06-16 NOTE — Patient Instructions (Signed)
 Earwax Buildup, Adult Your ears make something called earwax. It helps keep germs called bacteria away and protects the skin in your ears. Sometimes, too much earwax can build up. This can cause discomfort or make it harder to hear. What are the causes? Earwax buildup can happen when you have too much earwax in your ears. Earwax is made in the outer part of your ear canal. It's supposed to fall out in small amounts over time. But if your ears aren't able to clean themselves like they should, earwax can build up. What increases the risk? You're more likely to get earwax buildup if: You clean your ears with cotton swabs. You pick at your ears. You use earplugs or in-ear headphones a lot. You wear hearing aids. You may also be more likely to get it if: You're male. You're older. Your ears naturally make more earwax. You have narrow ear canals or extra hair in your ears. Your earwax is too thick or sticky. You have eczema. You're dehydrated. This means there's not enough fluid in your body. What are the signs or symptoms? Symptoms of earwax buildup include: Not being able to hear as well. A feeling of fullness in your ear. Feeling like your ear is plugged. Fluid coming from your ear. Ear pain or an itchy ear. Ringing in your ear. Coughing or problems with balance. How is this diagnosed? Earwax buildup may be diagnosed based on your symptoms, medical history, and an ear exam. During the exam, your health care provider will look into your ear with a tool called an otoscope. You may also have tests, such as a hearing test. How is this treated? Earwax buildup may be treated by: Using ear drops. Having the earwax removed by a provider. The provider may: Flush the ear with water. Use a tool called a curette that has a loop on the end. Use a suction device. Having surgery. This may be done in severe cases. Follow these instructions at home:  Cleaning your ears Clean your ears as told  by your provider. You can clean the outside of your ears with a washcloth or tissue. Do not overclean your ears. Do not put anything into your ear unless told. This includes cotton swabs. General instructions Take over-the-counter and prescription medicines only as told by your provider. Drink enough fluid to keep your pee (urine) pale yellow. This helps thin the earwax. If you have hearing aids, clean them as told. Keep all follow-up visits. If earwax builds up in your ears often or if you use hearing aids, ask your provider how often you should have your ears cleaned. Contact a health care provider if: Your ear pain gets worse. You have a fever. You have pus, blood, or other fluid coming from your ear. You have hearing loss. You have ringing in your ears that won't go away. You feel like the room is spinning. This is called vertigo. Your symptoms don't get better with treatment. This information is not intended to replace advice given to you by your health care provider. Make sure you discuss any questions you have with your health care provider. Document Revised: 08/12/2022 Document Reviewed: 08/12/2022 Elsevier Patient Education  2024 [employer].

## 2023-06-16 NOTE — Assessment & Plan Note (Signed)
 Debrox daily for 1-2 weeks

## 2023-06-17 LAB — URINE CULTURE
MICRO NUMBER:: 15909979
SPECIMEN QUALITY:: ADEQUATE

## 2023-06-20 ENCOUNTER — Other Ambulatory Visit (HOSPITAL_BASED_OUTPATIENT_CLINIC_OR_DEPARTMENT_OTHER): Payer: Self-pay

## 2023-06-20 MED ORDER — LORAZEPAM 1 MG PO TABS
2.0000 mg | ORAL_TABLET | Freq: Every day | ORAL | 0 refills | Status: DC
  Filled 2023-06-20: qty 60, 30d supply, fill #0

## 2023-06-21 ENCOUNTER — Encounter: Payer: Self-pay | Admitting: Family Medicine

## 2023-06-21 ENCOUNTER — Ambulatory Visit: Payer: PPO | Attending: Family Medicine | Admitting: Physical Therapy

## 2023-06-21 ENCOUNTER — Ambulatory Visit (INDEPENDENT_AMBULATORY_CARE_PROVIDER_SITE_OTHER): Payer: PPO | Admitting: Family Medicine

## 2023-06-21 VITALS — BP 144/82 | HR 79 | Temp 97.9°F | Resp 18 | Ht 69.0 in

## 2023-06-21 DIAGNOSIS — H6123 Impacted cerumen, bilateral: Secondary | ICD-10-CM

## 2023-06-21 DIAGNOSIS — Z9181 History of falling: Secondary | ICD-10-CM

## 2023-06-21 DIAGNOSIS — I69354 Hemiplegia and hemiparesis following cerebral infarction affecting left non-dominant side: Secondary | ICD-10-CM

## 2023-06-21 DIAGNOSIS — F0390 Unspecified dementia without behavioral disturbance: Secondary | ICD-10-CM

## 2023-06-21 DIAGNOSIS — E785 Hyperlipidemia, unspecified: Secondary | ICD-10-CM

## 2023-06-21 DIAGNOSIS — I10 Essential (primary) hypertension: Secondary | ICD-10-CM | POA: Diagnosis not present

## 2023-06-21 DIAGNOSIS — M6281 Muscle weakness (generalized): Secondary | ICD-10-CM | POA: Diagnosis not present

## 2023-06-21 DIAGNOSIS — I63511 Cerebral infarction due to unspecified occlusion or stenosis of right middle cerebral artery: Secondary | ICD-10-CM

## 2023-06-21 DIAGNOSIS — R278 Other lack of coordination: Secondary | ICD-10-CM

## 2023-06-21 DIAGNOSIS — M25512 Pain in left shoulder: Secondary | ICD-10-CM | POA: Diagnosis not present

## 2023-06-21 DIAGNOSIS — G20A1 Parkinson's disease without dyskinesia, without mention of fluctuations: Secondary | ICD-10-CM | POA: Diagnosis not present

## 2023-06-21 DIAGNOSIS — Z0000 Encounter for general adult medical examination without abnormal findings: Secondary | ICD-10-CM

## 2023-06-21 DIAGNOSIS — R262 Difficulty in walking, not elsewhere classified: Secondary | ICD-10-CM | POA: Diagnosis not present

## 2023-06-21 DIAGNOSIS — R29898 Other symptoms and signs involving the musculoskeletal system: Secondary | ICD-10-CM | POA: Diagnosis not present

## 2023-06-21 DIAGNOSIS — R2689 Other abnormalities of gait and mobility: Secondary | ICD-10-CM

## 2023-06-21 DIAGNOSIS — R296 Repeated falls: Secondary | ICD-10-CM | POA: Diagnosis not present

## 2023-06-21 NOTE — Progress Notes (Signed)
 Established Patient Office Visit  Subjective   Patient ID: William [name], male    DOB: [birth year]  Age: 83 y.o. MRN: [id number]  Chief Complaint  Patient presents with   Annual Exam    Pt states not fasting     HPI Discussed the use of AI scribe software for clinical note transcription with the patient, who gave verbal consent to proceed.  History of Present Illness   The patient, with a history of left-sided weakness in the arm and leg, presents with a complaint of ear blockage. He has been using Debrox for the issue, but reports no improvement or discharge from the ear. The patient's caregiver has been assisting with the administration of the Debrox.  The patient also has a history of urinary issues, but currently reports no pain during urination. He recently had a negative culture for a suspected urinary infection.  The patient has been attending physical therapy for his left-sided weakness and has been walking at home with assistance due to the inability to use a walker or cane. The patient's mobility is limited due to the weakness in his left arm and leg.  The patient has not been receiving regular eye care, but uses reading glasses. He has regular dental check-ups and is under the care of several specialists.  The patient's medication regimen and dietary habits were not discussed in detail during the consultation. The patient did not report any new symptoms or changes in his condition.      Patient Active Problem List   Diagnosis Date Noted   Bronchitis 06/16/2023   Impacted cerumen of left ear 06/16/2023   Acute encephalopathy 04/18/2023   Parkinson's disease (HCC) 04/18/2023   History of CVA with residual deficit 04/18/2023   Chronic kidney disease, stage 3b (HCC) 04/18/2023   Dementia with behavioral disturbance (HCC) 04/18/2023   Anemia of chronic disease 04/18/2023   Laceration of right upper extremity 02/24/2023   Dermatitis 02/24/2023   Lung nodules 02/24/2023    Dysuria 08/03/2022   Nonintractable headache 07/01/2022   Bilateral impacted cerumen 06/24/2022   Rash 06/15/2022   Postoperative ileus (HCC) 02/27/2022   Ileus, postoperative (HCC) 02/26/2022   Choledocholithiasis 02/19/2022   DNR (do not resuscitate) 02/19/2022   Left elbow pain 01/26/2022   Mid back pain on left side 01/26/2022   Rib pain 01/26/2022   Acute pain of left shoulder 11/12/2021   Leukocytes in urine 11/12/2021   Urinary frequency 11/12/2021   Thrush 10/08/2021   Hemiplegia, 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   Insomnia    Prediabetes    Acute 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 (HCC)    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   Transaminitis 07/27/2021   UTI (urinary tract infection) 07/27/2021   CVA (cerebral vascular accident) (HCC) 07/27/2021   Fall 07/27/2021   Hyperglycemia 07/27/2021   Cholelithiasis 07/27/2021   Hypoxia 07/27/2021   Nausea and vomiting 07/27/2021   Acute metabolic encephalopathy 07/27/2021   Normocytic anemia 07/27/2021   Chronic back pain 07/27/2021   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   Closed fracture of first lumbar vertebra with routine healing 02/03/2021   Closed fracture of multiple ribs 11/18/2020   Anxiety 01/29/2020   Leg pain, bilateral 01/29/2020   Ingrown toenail 07/13/2019   Lumbar spondylosis 05/02/2018   Pain in left knee 03/09/2018   Osteoarthritis of left hip 01/16/2018   Trochanteric bursitis of left hip 01/16/2018  Preventative health care 09/26/2017   HTN (hypertension) 07/19/2015   Hyperlipidemia 07/19/2015   Great toe pain 02/11/2014   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   Obesity (BMI 30-39.9) 06/25/2013   Renal insufficiency 06/25/2013   Weakness of left arm 06/25/2013   Sebaceous cyst 03/03/2011   Sprain of lumbar region 07/31/2010   Rib pain, left 08/29/2009   Carotid artery stenosis,  asymptomatic, bilateral 05/02/2009   Eczema, atopic 05/31/2008   Vitamin D  deficiency 03/01/2008   BPH (benign prostatic hyperplasia) 08/06/2007   Fasting hyperglycemia 12/21/2006   History of right MCA infarct 2006   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   HTN (hypertension) 07/19/2015   Hyperlipidemia    Major neurocognitive disorder 01/09/2014   Mild, related to stroke history   Nocturia    Renal insufficiency 06/25/2013   S/P carotid endarterectomy    BILATERAL ICA--  PATENT PER DUPLEX  05-19-2012   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/26/2017   Right calf   Urinary frequency    Vitamin D  deficiency    Past Surgical History:  Procedure Laterality Date   APPENDECTOMY  AS CHILD   CARDIOVASCULAR STRESS TEST  03-27-2012  DR CRENSHAW   LOW RISK LEXISCAN  STUDY-- PROBABLE NORMAL PERFUSION AND SOFT TISSUE ATTENUATION/  NO ISCHEMIA/ EF 51%   CAROTID ENDARTERECTOMY Bilateral LEFT  11-12-2008  DR GREG HAYES   RIGHT ICA  2006  ([facility])   CHOLECYSTECTOMY N/A 02/23/2022   Procedure: LAPAROSCOPIC CHOLECYSTECTOMY;  Surgeon: Lyndel Deward PARAS, MD;  Location: [facility] OR;  Service: General;  Laterality: N/A;   CYSTOSCOPY W/ RETROGRADES Bilateral 06/22/2021   Procedure: CYSTOSCOPY WITH RETROGRADE PYELOGRAM;  Surgeon: [name], MD;  Location: [facility];  Service: Urology;  Laterality: Bilateral;   CYSTOSCOPY WITH LITHOLAPAXY N/A 02/26/2013   Procedure: CYSTOSCOPY WITH LITHOLAPAXY;  Surgeon: Senior Matilda, MD;  Location: [facility];   Service: Urology;  Laterality: N/A;   ENDOSCOPIC RETROGRADE CHOLANGIOPANCREATOGRAPHY (ERCP) WITH PROPOFOL  N/A 02/22/2022   Procedure: ENDOSCOPIC RETROGRADE CHOLANGIOPANCREATOGRAPHY (ERCP) WITH PROPOFOL ;  Surgeon: [name], MD;  Location: [facility] ENDOSCOPY;  Service: Gastroenterology;  Laterality: N/A;   EYE SURGERY  Jan. 2016   cataract surgery both eyes   INGUINAL HERNIA REPAIR Right 11-08-2006   IR KYPHO EA ADDL LEVEL THORACIC OR LUMBAR  02/12/2021   IR RADIOLOGIST EVAL & MGMT  02/18/2021   MASS EXCISION N/A 03/03/2016   Procedure: EXCISION OF BACK  MASS;  Surgeon: [name], MD;  Location: [facility];  Service: General;  Laterality: N/A;   MOHS SURGERY Left 1/ 2016   [name]-- Basal cell   PROSTATE SURGERY     REMOVAL OF STONES  02/22/2022   Procedure: REMOVAL OF STONES;  Surgeon: [name], MD;  Location: [facility] ENDOSCOPY;  Service: Gastroenterology;;   [name]  02/22/2022   Procedure: [name];  Surgeon: [name], MD;  Location: [facility] ENDOSCOPY;  Service: Gastroenterology;;   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 N/A 06/22/2021  Procedure: TRANSURETHRAL RESECTION OF BLADDER TUMOR;  Surgeon: [name], MD;  Location: [facility];  Service: Urology;  Laterality: N/A;   TRANSURETHRAL RESECTION OF PROSTATE N/A 02/26/2013   Procedure: TRANSURETHRAL RESECTION OF THE PROSTATE WITH GYRUS INSTRUMENTS;  Surgeon: Senior Matilda, MD;  Location: [facility];  Service: Urology;  Laterality: N/A;   TRANSURETHRAL RESECTION OF PROSTATE N/A 06/22/2021   Procedure: TRANSURETHRAL RESECTION OF THE PROSTATE (TURP);  Surgeon: [name], MD;  Location: [facility];  Service: Urology;  Laterality: N/A;   Social History   Tobacco Use   Smoking status: Former    Current packs/day: 0.00    Average packs/day: 2.0 packs/day for 40.0 years (80.0 ttl pk-yrs)    Types: Cigarettes    Start date: 02/15/1965     Quit date: 02/15/2005    Years since quitting: 18.3   Smokeless tobacco: Never  Vaping Use   Vaping status: Never Used  Substance Use Topics   Alcohol use: Not Currently   Drug use: No   Social History   Socioeconomic History   Marital status: Married    Spouse name: Not on file   Number of children: 2   Years of education: 12   Highest education level: Associate degree: occupational, scientist, product/process development, or vocational program  Occupational History    Employer: Retired  Tobacco Use   Smoking status: Former    Current packs/day: 0.00    Average packs/day: 2.0 packs/day for 40.0 years (80.0 ttl pk-yrs)    Types: Cigarettes    Start date: 02/15/1965    Quit date: 02/15/2005    Years since quitting: 18.3   Smokeless tobacco: Never  Vaping Use   Vaping status: Never Used  Substance and Sexual Activity   Alcohol use: Not Currently   Drug use: No   Sexual activity: Not Currently    Partners: Female  Other Topics Concern   Not on file  Social History Narrative   Exercise--  Walks with assistance -- using wheelchair more than the walker       LIves with wife , no stairs in home, caffeine - one cup coffee day, exercise - not much, Right handed, 12th grade, retired      One story home   Social Drivers of Health   Financial Resource Strain: Medium Risk (06/14/2023)   Overall Financial Resource Strain (CARDIA)    Difficulty of Paying Living Expenses: Somewhat hard  Food Insecurity: No Food Insecurity (06/14/2023)   Hunger Vital Sign    Worried About Running Out of Food in the Last Year: Never true    Ran Out of Food in the Last Year: Never true  Transportation Needs: No Transportation Needs (06/14/2023)   PRAPARE - Administrator, Civil Service (Medical): No    Lack of Transportation (Non-Medical): No  Physical Activity: Inactive (06/14/2023)   Exercise Vital Sign    Days of Exercise per Week: 0 days    Minutes of Exercise per Session: 10 min  Stress: Stress Concern Present  (06/14/2023)   [employer] of Occupational Health - Occupational Stress Questionnaire    Feeling of Stress : To some extent  Social Connections: Unknown (06/14/2023)   Social Connection and Isolation Panel [NHANES]    Frequency of Communication with Friends and Family: More than three times a week    Frequency of Social Gatherings with Friends and Family: More than three times a week    Attends Religious Services: Patient declined  Active Member of Clubs or Organizations: No    Attends Banker Meetings: Not on file    Marital Status: Married  Intimate Partner Violence: Not At Risk (04/19/2023)   Humiliation, Afraid, Rape, and Kick questionnaire    Fear of Current or Ex-Partner: No    Emotionally Abused: No    Physically Abused: No    Sexually Abused: No   Family Status  Relation Name Status   Mother  Deceased at age 62   Father  Deceased at age 66   Sister  Alive   Sister  Alive   Sister  Deceased  No partnership data on file   Family History  Problem Relation Age of Onset   Heart disease Mother        CHF   Bipolar disorder Mother    Heart disease Father        CHF   Allergies  Allergen Reactions   Bee Venom Anaphylaxis   Strawberry Extract Hives   Latex Itching   Zetia  [Ezetimibe ] Other (See Comments)    Intolerance    Adhesive [Tape] Other (See Comments)    blisters   Statins Other (See Comments)    myalgias      Review of Systems  Constitutional:  Negative for chills, fever and malaise/fatigue.  HENT:  Negative for congestion and hearing loss.   Eyes:  Negative for discharge.  Respiratory:  Negative for cough, sputum production and shortness of breath.   Cardiovascular:  Negative for chest pain, palpitations and leg swelling.  Gastrointestinal:  Negative for abdominal pain, blood in stool, constipation, diarrhea, heartburn, nausea and vomiting.  Genitourinary:  Negative for dysuria, frequency, hematuria and urgency.  Musculoskeletal:   Negative for back pain, falls and myalgias.  Skin:  Negative for rash.  Neurological:  Positive for weakness. Negative for dizziness, sensory change, loss of consciousness and headaches.  Endo/Heme/Allergies:  Negative for environmental allergies. Does not bruise/bleed easily.  Psychiatric/Behavioral:  Positive for memory loss. Negative for depression and suicidal ideas. The patient is not nervous/anxious and does not have insomnia.       Objective:     BP (!) 144/82 (BP Location: Right Arm, Patient Position: Sitting, Cuff Size: Normal)   Pulse 79   Temp 97.9 F (36.6 C) (Oral)   Resp 18   Ht 5' 9 (1.753 m)   SpO2 99%   BMI 24.42 kg/m  BP Readings from Last 3 Encounters:  06/21/23 (!) 144/82  06/16/23 (!) 160/80  05/10/23 (!) 165/66   Wt Readings from Last 3 Encounters:  05/10/23 165 lb 5.5 oz (75 kg)  04/30/23 165 lb 5.5 oz (75 kg)  04/18/23 166 lb 10.7 oz (75.6 kg)   SpO2 Readings from Last 3 Encounters:  06/21/23 99%  06/16/23 100%  05/10/23 98%      Physical Exam Vitals and nursing note reviewed.  Constitutional:      General: He is not in acute distress.    Appearance: Normal appearance. He is well-developed.  HENT:     Head: Normocephalic and atraumatic.  Eyes:     General: No scleral icterus.       Right eye: No discharge.        Left eye: No discharge.  Cardiovascular:     Rate and Rhythm: Normal rate and regular rhythm.     Heart sounds: No murmur heard. Pulmonary:     Effort: Pulmonary effort is normal. No respiratory distress.     Breath sounds: Normal  breath sounds.  Musculoskeletal:        General: Normal range of motion.     Cervical back: Normal range of motion and neck supple.     Right lower leg: No edema.     Left lower leg: No edema.  Skin:    General: Skin is warm and dry.  Neurological:     Mental Status: He is alert and oriented to person, place, and time.     Comments: Weakness L arm and leg--- no change   Psychiatric:         Mood and Affect: Mood normal.        Behavior: Behavior normal.        Thought Content: Thought content normal.        Judgment: Judgment normal.      No results found for any visits on 06/21/23.  Last CBC Lab Results  Component Value Date   WBC 6.7 05/10/2023   HGB 12.1 (L) 05/10/2023   HCT 36.1 (L) 05/10/2023   MCV 99.7 05/10/2023   MCH 33.4 05/10/2023   RDW 13.2 05/10/2023   PLT 149 (L) 05/10/2023   Last metabolic panel Lab Results  Component Value Date   GLUCOSE 117 (H) 05/10/2023   NA 134 (L) 05/10/2023   K 4.7 05/10/2023   CL 103 05/10/2023   CO2 22 05/10/2023   BUN 24 (H) 05/10/2023   CREATININE 1.88 (H) 05/10/2023   GFRNONAA 36 (L) 05/10/2023   CALCIUM  8.7 (L) 05/10/2023   PHOS 3.4 03/03/2022   PROT 6.0 (L) 05/10/2023   ALBUMIN 3.4 (L) 05/10/2023   LABGLOB 3.2 04/25/2019   AGRATIO 1.3 04/25/2019   BILITOT 0.6 05/10/2023   ALKPHOS 111 05/10/2023   AST 17 05/10/2023   ALT <5 05/10/2023   ANIONGAP 9 05/10/2023   Last lipids Lab Results  Component Value Date   CHOL 178 04/18/2023   HDL 51 04/18/2023   LDLCALC 112 (H) 04/18/2023   TRIG 76 04/18/2023   CHOLHDL 3.5 04/18/2023   Last hemoglobin A1c Lab Results  Component Value Date   HGBA1C 4.9 04/18/2023   Last thyroid  functions Lab Results  Component Value Date   TSH 0.53 02/24/2023   Last vitamin D  Lab Results  Component Value Date   VD25OH 46.2 05/18/2022   Last vitamin B12 and Folate Lab Results  Component Value Date   VITAMINB12 203 04/19/2023   FOLATE 7.0 03/01/2022      The ASCVD Risk score (Arnett DK, et al., 2019) failed to calculate for the following reasons:   The 2019 ASCVD risk score is only valid for ages 89 to 83   Risk score cannot be calculated because patient has a medical history suggesting prior/existing ASCVD    Assessment & Plan:   Problem List Items Addressed This Visit       Unprioritized   Major vascular neurocognitive disorder   Hyperlipidemia    Relevant Orders   Comprehensive metabolic panel   Lipid panel   TSH   HTN (hypertension)   Relevant Orders   CBC with Differential/Platelet   Comprehensive metabolic panel   TSH   Weakness of left arm   Secondary to cva      Preventative health care - Primary   Ghm utd Check labs  See AVS  Health Maintenance  Topic Date Due   Medicare Annual Wellness (AWV)  06/22/2017   COVID-19 Vaccine (8 - 2024-25 season) 04/19/2023   DTaP/Tdap/Td (3 - Td or Tdap) 06/03/2032  Pneumonia Vaccine 89+ Years old  Completed   INFLUENZA VACCINE  Completed   Zoster Vaccines- Shingrix   Completed   HPV VACCINES  Aged Out   Colonoscopy  Discontinued   Hepatitis C Screening  Discontinued         Parkinson's disease (HCC)   Per neuro       CVA (cerebral vascular accident) (HCC)   Stable  Con' PT      Bilateral impacted cerumen   Pt unable to tolerate irrigation  Con't debrox Refer to ent      Assessment and Plan    Hearing Loss   He reports difficulty hearing and his ears appear black. Despite using Debrox all weekend, there has been no noticeable discharge. We will attempt manual earwax removal and refer him to an ENT if manual removal is unsuccessful.  Knee Pain   He twisted his knee before Christmas and experienced a recent exacerbation after walking down steps, resulting in limping. We advised him to rest and avoid activities that exacerbate the pain. We will consider a referral to orthopedics if the pain persists.  Left-Sided Weakness   He exhibits left-sided weakness in both the arm and leg, requiring assistance for ambulation and is unable to use a walker or cane due to left arm weakness. He will continue physical therapy and we will monitor for changes in strength or mobility.  General Health Maintenance   We recommended the RSV vaccine, available at the downstairs clinic, and advised him to continue regular dental and eye check-ups.  Follow-up   We will recheck his ear if  symptoms persist, monitor the knee pain and consider an orthopedics referral if there is no improvement, continue physical therapy for left-sided weakness, and ensure the RSV vaccine is administered.       Return in about 6 months (around 12/19/2023), or if symptoms worsen or fail to improve.    Muhsin Doris R Lowne Chase, DO

## 2023-06-21 NOTE — Assessment & Plan Note (Signed)
 Pt unable to tolerate irrigation  Con't debrox Refer to ent

## 2023-06-21 NOTE — Assessment & Plan Note (Signed)
 Stable  Con' PT

## 2023-06-21 NOTE — Assessment & Plan Note (Signed)
 Secondary to cva

## 2023-06-21 NOTE — Patient Instructions (Signed)
 Preventive Care 73 Years and Older, Male Preventive care refers to lifestyle choices and visits with your health care provider that can promote health and wellness. Preventive care visits are also called wellness exams. What can I expect for my preventive care visit? Counseling During your preventive care visit, your health care provider may ask about your: Medical history, including: Past medical problems. Family medical history. History of falls. Current health, including: Emotional well-being. Home life and relationship well-being. Sexual activity. Memory and ability to understand (cognition). Lifestyle, including: Alcohol, nicotine or tobacco, and drug use. Access to firearms. Diet, exercise, and sleep habits. Work and work Astronomer. Sunscreen use. Safety issues such as seatbelt and bike helmet use. Physical exam Your health care provider will check your: Height and weight. These may be used to calculate your BMI (body mass index). BMI is a measurement that tells if you are at a healthy weight. Waist circumference. This measures the distance around your waistline. This measurement also tells if you are at a healthy weight and may help predict your risk of certain diseases, such as type 2 diabetes and high blood pressure. Heart rate and blood pressure. Body temperature. Skin for abnormal spots. What immunizations do I need?  Vaccines are usually given at various ages, according to a schedule. Your health care provider will recommend vaccines for you based on your age, medical history, and lifestyle or other factors, such as travel or where you work. What tests do I need? Screening Your health care provider may recommend screening tests for certain conditions. This may include: Lipid and cholesterol levels. Diabetes screening. This is done by checking your blood sugar (glucose) after you have not eaten for a while (fasting). Hepatitis C test. Hepatitis B test. HIV (human  immunodeficiency virus) test. STI (sexually transmitted infection) testing, if you are at risk. Lung cancer screening. Colorectal cancer screening. Prostate cancer screening. Abdominal aortic aneurysm (AAA) screening. You may need this if you are a current or former smoker. Talk with your health care provider about your test results, treatment options, and if necessary, the need for more tests. Follow these instructions at home: Eating and drinking  Eat a diet that includes fresh fruits and vegetables, whole grains, lean protein, and low-fat dairy products. Limit your intake of foods with high amounts of sugar, saturated fats, and salt. Take vitamin and mineral supplements as recommended by your health care provider. Do not drink alcohol if your health care provider tells you not to drink. If you drink alcohol: Limit how much you have to 0-2 drinks a day. Know how much alcohol is in your drink. In the [place] one drink equals one 12 oz bottle of beer (355 mL), one 5 oz glass of wine (148 mL), or one 1 oz glass of hard liquor (44 mL). Lifestyle Brush your teeth every morning and night with fluoride toothpaste. Floss one time each day. Exercise for at least 30 minutes 5 or more days each week. Do not use any products that contain nicotine or tobacco. These products include cigarettes, chewing tobacco, and vaping devices, such as e-cigarettes. If you need help quitting, ask your health care provider. Do not use drugs. If you are sexually active, practice safe sex. Use a condom or other form of protection to prevent STIs. Take aspirin only as told by your health care provider. Make sure that you understand how much to take and what form to take. Work with your health care provider to find out whether it is safe  and beneficial for you to take aspirin daily. Ask your health care provider if you need to take a cholesterol-lowering medicine (statin). Find healthy ways to manage stress, such  as: Meditation, yoga, or listening to music. Journaling. Talking to a trusted person. Spending time with friends and family. Safety Always wear your seat belt while driving or riding in a vehicle. Do not drive: If you have been drinking alcohol. Do not ride with someone who has been drinking. When you are tired or distracted. While texting. If you have been using any mind-altering substances or drugs. Wear a helmet and other protective equipment during sports activities. If you have firearms in your house, make sure you follow all gun safety procedures. Minimize exposure to UV radiation to reduce your risk of skin cancer. What's next? Visit your health care provider once a year for an annual wellness visit. Ask your health care provider how often you should have your eyes and teeth checked. Stay up to date on all vaccines. This information is not intended to replace advice given to you by your health care provider. Make sure you discuss any questions you have with your health care provider. Document Revised: 11/26/2020 Document Reviewed: 11/26/2020 Elsevier Patient Education  2024 [employer].

## 2023-06-21 NOTE — Assessment & Plan Note (Signed)
 Ghm utd Check labs  See AVS  Health Maintenance  Topic Date Due   Medicare Annual Wellness (AWV)  06/22/2017   COVID-19 Vaccine (8 - 2024-25 season) 04/19/2023   DTaP/Tdap/Td (3 - Td or Tdap) 06/03/2032   Pneumonia Vaccine 44+ Years old  Completed   INFLUENZA VACCINE  Completed   Zoster Vaccines- Shingrix   Completed   HPV VACCINES  Aged Out   Colonoscopy  Discontinued   Hepatitis C Screening  Discontinued

## 2023-06-21 NOTE — Assessment & Plan Note (Signed)
 Per neuro

## 2023-06-21 NOTE — Therapy (Signed)
 OUTPATIENT PHYSICAL THERAPY NEURO TREATMENT  Patient Name: William [name] MRN: [id number] DOB:[birth year], 83 y.o., male Today's Date: 06/21/2023  PCP: [name]  DO REFERRING PROVIDER: same   END OF SESSION:  PT End of Session - 06/21/23 1059     Visit Number 63    Number of Visits 66    Date for PT Re-Evaluation 06/17/23    Authorization Type HTA    PT Start Time 1100    PT Stop Time 1140    PT Time Calculation (min) 40 min    Activity Tolerance Patient tolerated treatment well    Behavior During Therapy WFL for tasks assessed/performed              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   HTN (hypertension) 07/19/2015   Hyperlipidemia    Major neurocognitive disorder 01/09/2014   Mild, related to stroke history   Nocturia    Renal insufficiency 06/25/2013   S/P carotid endarterectomy    BILATERAL ICA--  PATENT PER DUPLEX  05-19-2012   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/26/2017   Right calf   Urinary frequency    Vitamin D  deficiency    Past Surgical History:  Procedure Laterality Date   APPENDECTOMY  AS CHILD   CARDIOVASCULAR STRESS TEST  03-27-2012  DR CRENSHAW   LOW RISK LEXISCAN  STUDY-- PROBABLE NORMAL PERFUSION AND SOFT TISSUE ATTENUATION/  NO ISCHEMIA/ EF 51%   CAROTID ENDARTERECTOMY Bilateral LEFT  11-12-2008  DR GREG HAYES   RIGHT ICA  2006  ([facility])   CHOLECYSTECTOMY N/A 02/23/2022   Procedure: LAPAROSCOPIC CHOLECYSTECTOMY;  Surgeon: [name], MD;  Location: [facility];  Service: General;  Laterality: N/A;    CYSTOSCOPY W/ RETROGRADES Bilateral 06/22/2021   Procedure: CYSTOSCOPY WITH RETROGRADE PYELOGRAM;  Surgeon: [name], MD;  Location: [facility];  Service: Urology;  Laterality: Bilateral;   CYSTOSCOPY WITH LITHOLAPAXY N/A 02/26/2013   Procedure: CYSTOSCOPY WITH LITHOLAPAXY;  Surgeon: Senior Matilda, MD;  Location: [facility];  Service: Urology;  Laterality: N/A;   ENDOSCOPIC RETROGRADE CHOLANGIOPANCREATOGRAPHY (ERCP) WITH PROPOFOL  N/A 02/22/2022   Procedure: ENDOSCOPIC RETROGRADE CHOLANGIOPANCREATOGRAPHY (ERCP) WITH PROPOFOL ;  Surgeon: [name], MD;  Location: [facility]COPY;  Service: Gastroenterology;  Laterality: N/A;   EYE SURGERY  Jan. 2016   cataract surgery both eyes   INGUINAL HERNIA REPAIR Right 11-08-2006   IR KYPHO EA ADDL LEVEL THORACIC OR LUMBAR  02/12/2021   IR RADIOLOGIST EVAL & MGMT  02/18/2021   MASS EXCISION N/A 03/03/2016   Procedure: EXCISION OF BACK  MASS;  Surgeon: [name], MD;  Location: [facility];  Service: General;  Laterality: N/A;   MOHS SURGERY Left 1/ 2016   [name]-- Basal cell   PROSTATE SURGERY     REMOVAL OF STONES  02/22/2022   Procedure: REMOVAL OF STONES;  Surgeon: [name], MD;  Location: [facility] ENDOSCOPY;  Service: Gastroenterology;;   [name]  02/22/2022   Procedure: SPHINCTEROTOMY;  Surgeon: [name], MD;  Location: [facility] ENDOSCOPY;  Service: Gastroenterology;;   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 N/A 06/22/2021   Procedure: TRANSURETHRAL RESECTION OF BLADDER TUMOR;  Surgeon: [name], MD;  Location: [facility];  Service: Urology;  Laterality: N/A;   TRANSURETHRAL RESECTION OF PROSTATE N/A 02/26/2013   Procedure: TRANSURETHRAL RESECTION OF THE PROSTATE WITH GYRUS INSTRUMENTS;  Surgeon: Senior Matilda, MD;  Location: [facility];  Service: Urology;  Laterality: N/A;   TRANSURETHRAL RESECTION OF PROSTATE N/A 06/22/2021    Procedure: TRANSURETHRAL RESECTION OF THE PROSTATE (TURP);  Surgeon: [name], MD;  [facility];  Service: Urology;  Laterality: N/A;   Patient Active Problem List   Diagnosis Date Noted   Bronchitis 06/16/2023   Impacted cerumen of left ear 06/16/2023   Acute encephalopathy 04/18/2023   Parkinson's disease (HCC) 04/18/2023   History of CVA with residual deficit 04/18/2023   Chronic kidney disease, stage 3b (HCC) 04/18/2023   Dementia with behavioral disturbance (HCC) 04/18/2023   Anemia of chronic disease 04/18/2023   Laceration of right upper extremity 02/24/2023   Dermatitis 02/24/2023   Lung nodules 02/24/2023   Dysuria 08/03/2022   Nonintractable headache 07/01/2022   Bilateral impacted cerumen 06/24/2022   Rash 06/15/2022   Postoperative ileus (HCC) 02/27/2022   Ileus, postoperative (HCC) 02/26/2022   Choledocholithiasis 02/19/2022   DNR (do not resuscitate) 02/19/2022   Left elbow pain 01/26/2022   Mid back pain on left side 01/26/2022   Rib pain 01/26/2022   Acute pain of left shoulder 11/12/2021   Leukocytes in urine 11/12/2021   Urinary frequency 11/12/2021   Thrush 10/08/2021   Hemiplegia, 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   Insomnia    Prediabetes    Acute 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 (HCC)    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   Transaminitis 07/27/2021   UTI (urinary tract infection) 07/27/2021   CVA (cerebral vascular accident) (HCC) 07/27/2021   Fall 07/27/2021   Hyperglycemia 07/27/2021   Cholelithiasis 07/27/2021   Hypoxia 07/27/2021   Nausea and vomiting 07/27/2021   Acute metabolic encephalopathy 07/27/2021   Normocytic anemia 07/27/2021   Chronic back pain 07/27/2021   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   Closed fracture of first lumbar vertebra with routine healing 02/03/2021   Closed fracture of multiple ribs  11/18/2020   Anxiety 01/29/2020   Leg pain, bilateral 01/29/2020   Ingrown toenail 07/13/2019   Lumbar spondylosis 05/02/2018   Pain in left knee 03/09/2018   Osteoarthritis of left hip 01/16/2018   Trochanteric bursitis of left hip 01/16/2018   Preventative health care 09/26/2017   HTN (hypertension) 07/19/2015   Hyperlipidemia 07/19/2015   Great toe pain 02/11/2014   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   Obesity (BMI 30-39.9) 06/25/2013   Renal insufficiency 06/25/2013   Weakness of left arm 06/25/2013   Sebaceous cyst 03/03/2011   Sprain of lumbar region 07/31/2010   Rib pain, left 08/29/2009   Carotid artery stenosis, asymptomatic, bilateral 05/02/2009   Eczema, atopic 05/31/2008   Vitamin D  deficiency 03/01/2008   BPH (benign prostatic hyperplasia) 08/06/2007   Fasting hyperglycemia 12/21/2006   History of right MCA infarct 2006    ONSET DATE: 06/29/22 REFERRING DIAG:  Diagnosis  I63.9 (ICD-10-CM) - Cerebrovascular accident (CVA), unspecified mechanism (HCC)  W19.XXXD (ICD-10-CM) - Fall, subsequent encounter    THERAPY DIAG:  Muscle weakness (generalized)  Other  abnormalities of gait and mobility  Hemiplegia and hemiparesis following cerebral infarction affecting left non-dominant side (HCC)  History of falling  Difficulty in walking, not elsewhere classified  Other lack of coordination  Rationale for Evaluation and Treatment: Rehabilitation  SUBJECTIVE:                                                                                                                                                                                         SUBJECTIVE STATEMENT:  No falls, no issues reported  PERTINENT HISTORY:  GAJE TENNYSON is a 83 year old man with dementia, CKD, HTN, CAD, HLD and history of CVA  (2006, 2023), Parkinson's  PAIN:  Are you having pain? Faces/behavior scale- mild pain BLEs   PRECAUTIONS: Fall  WEIGHT BEARING RESTRICTIONS:  No  FALLS: Has patient fallen in last 6 months? No  LIVING ENVIRONMENT: Lives with: lives with their family and lives with their spouse Lives in: House/apartment Stairs:  1 brick high step Has following equipment at home: Environmental Consultant - 2 wheeled, Wheelchair (manual), Shower bench, bed side commode, Grab bars, and hospital bed, sliding pad for car  PLOF: Independent with basic ADLs and Independent with household mobility without device  PATIENT GOALS: Walk around the block with his wife. Be able to use the dining room chair rather than have to use the W/C. Increased I with bed mobility and simple tasks at home like make some coffee or brush his teeth, Step over tub to shower, has bench, but he can't really use it. Up and down the one step to get to back deck. 11/23/22: Patient's wife Devere updated his goals today: Walk around store (gets distracted), swing a golf club again (thinking of driving range), get up on his own, walk without leaning forward, more recognition of the left side (from CVA), not need gait belt, OT-- be able to get dressed more on his own, navigate the newspaper.  OBJECTIVE:  TREATMENT: 06/21/23 Seated PWR! Sit<>stand x10 Standing stepping on to numbers on floor R&L Side lunge on to numbers and then performed with cognitive tasks (I.e. adding/subtracting) High knees up into pillow Side kick into pillow Punching into pillow Box stepping on numbers with HHA CW & CCW Amb x 4 laps around gym with gait belt, CGA for safety (too much forward lean)   06/14/23 Nustep level 5 x 7 minutes Bike level 4 x 6 minutes Gait with HHA and belt outside asphalt 200 feet rest then 300 feet Standing balance touching numbers on wall did miss some on the left side needed some cues, to look to the left and to not hold on, did right  arm reaching and left arm reaching [employer] Side stepping, backward walking Gait outside in the grass x 200 feet  05/31/23 Nustep level 5 x 7  minutes Gait outside with HHA 200 feet, rest and then 300 feet. Standing ball toss Ball kicks 2.5# LAQ, marches Side stepping, forward and backward walking In pbars hip abduction In pbars step turn reach On bike level 4 x 6 minutes Josiephine with HHA x 200 feet  05/26/23 Bike level 4 x 5 minutes Nustep level 5 x 5 minutes Side stepping, step turn reaches [employer], volley ball [employer] Walking 200 feet x 2 Sit to stand with verbal cues Gentle Passive ROM left shoulder Red tband HS curls   05/17/23 Nustep level 5 x 5 mintues TUG 40 seconds, had a very difficult time getting up from sitting, needed cues 5XSTS: 75 seconds with CGA and a lot of verbal cues HHA walking with some cues 330 feet Standing marching, hip abduction In bars side stepping Ball toss, ball hits  Practiced sit to stands needing help with the left hand and getting his weight forward  04/12/23 Nustep level 5 x 5 minutes Gait outside around the back parking [place], rested and then from there around to the front of the building Standing ball toss, ball hits and then ball kicks, some SBA In pbars marching, abduction and extension In pbars step reach a lot of cues needed for this today Gait out the back door around in the grass and uneven terrain to the car  04/05/23 UBE level 4 x 6 minutes Gait HHA walking outside a lot of cues for step length around the building to the front door [employer] kicks Side step in pbars Step turn reach in pbars AAROM left shoulder' Isometrics left shoulder  03/29/23 HHA working on amb with increased stride and safety - pt tends to vary speeds and lean fwd cuing needed STS with wt ball press HHA 6 in step tap 20 x with HHA then 20x without - min A with several LOB Func balance ex carrying obj Batting ball in standing Nustep L 5 6 min PWR moves sitting and standing with cuing Boxing  03/24/23 NuStep L5x76mins UBE L2 x66mins each way  Side stepping Marches Hip  abduction STS minA from elevated mat- does not stand all the way up and needs one hand push off  Seated ball hits ad kicks  Seated chest press with 3# WaTe 2x10  03/15/23 Nustep level 5 x 7 minutes Gait outside with CGA and HHA multiple rests and redirection for big step Stairs step over step up and down with cues and CGA Seated volleyball hands and then kicks Side stepping Marches Hip abduction Fast walking with HHA  PATIENT EDUCATION: Education details: none new 02/16/23 Person educated: Patient and Spouse Education method: Explanation, Demonstration, Tactile cues, and Verbal cues Education comprehension: verbalized understanding, returned demonstration, verbal cues required, tactile cues required, and needs further education   ------------------------------------------------------------------------------------------------------- (Measures in this section from initial evaluation unless otherwise noted) DIAGNOSTIC FINDINGS:  MRI 2021 with degenerative changes in lumbar spine, L3-4 R subarticular stenosis COGNITION: Overall cognitive status: History of cognitive impairments - at baseline SENSATION: Not tested COORDINATION: Moderately impaired BLE MUSCLE LENGTH: Hamstrings: severely restricted B Thomas test: Severely restricted B. POSTURE: rounded shoulders, forward head, increased thoracic kyphosis, posterior pelvic tilt, and flexed trunk  LOWER EXTREMITY ROM:   BLE extremely stiff throughout, limited hip ROM in all planes, B knees limited in extension. LOWER  EXTREMITY MMT:  3+/5 throughout. Unable to determine accuracy of MMT due to cognition. Functionally demosntrates poor coordinated activation and poor muscular endurance. BED MOBILITY: Min A for Supine<> Sit. Patient was using a ladder in his hospital bed and could move MI. TRANSFERS: Min A, mod VC, tends to lean back.  11/02/22 independent with use of hands on arm chair with a couple of tries CURB: Min A-Has one step out to his  deck. STAIRS: N/A  GAIT: Gait pattern: step to pattern, decreased arm swing- Right, decreased arm swing- Left, decreased step length- Right, decreased step length- Left, shuffling, festinating, trunk flexed, narrow BOS, poor foot clearance- Right, and poor foot clearance- Left Distance walked: 73' Assistive device utilized: Environmental Consultant - 2 wheeled Level of assistance: Occasional min A Comments: mod VC to stay close to walker, take longer steps. FUNCTIONAL TESTS:  5 times sit to stand: 38.23  09/29/22  could not performs 5XSTS, 11/02/22 = 47 seconds with cues to use arms Timed up and go (TUG): 47.91  with RW, 07/21/22 TUG 29  seconds without device, 09/29/22 25 seconds, 11/02/22 TUG 20 seconds                                                                     PATIENT EDUCATION: Education details: Progress towards goals, POC Person educated: Patient and Spouse Education method: Programmer, Multimedia, Facilities Manager, and Verbal cues  Education comprehension: verbalized understanding, returned demonstration, verbal cues required, and needs further education  HOME EXERCISE PROGRAM: PWR! moves  GOALS: Goals reviewed with patient? Yes  SHORT TERM GOALS: Target date: 12/22/22  Perform HEP for PWR! Moves with cues from family. Baseline: Pt's wife, Devere, notes walking and hitting beach ball as main HEP. Goal status: GOAL MET, 12/20/2022   2. Decrease 5 x STS to < 35 sec to demonstrate improved functional strength  Baseline: 47 seconds on 5/21 per note; 34.5 sec UE support 12/20/2022  Goal status: GOAL MET, 12/20/2022   3. Perform sit <> stand transfers from average chair height with Supervision.  Baseline: CGA  12/20/2022; (01/10/23) CGA  Goal status: NOT MET  LONG TERM GOALS: Target date: 04/22/23  Patient will perform HEP progression with caregiver assist. Baseline: has minimal HEP Goal status: ONGOING  2.  Decrease 5 x STS to < 25 sec to demonstrate improved functional strength Baseline: 47 seconds on 5/21  per note; (01/10/23) 36 sec w/ cues for speed/sequence>30.38 sec with BUE supprot Goal status: PARTIALLY MET, 01/24/2023  3. Improve quality of gait to be able to ambulate x 100 feet with supervision. Baseline: CGA with cues  Goal status: REVISED  4.  Patient will perform his bed mobility with MI including rolling, sit<>supine, and scooting in bed. Baseline: on hard surface mat he is independent but on soft bed at home he struggles, wife reports only needs a hand to get up now 11/02/22; (01/10/23) modified indep.  Goal status: MET  5.  Patient will demonstrate floor<>stand with CGA and verbal cues for patient's wife to be able to assist. Baseline:  Has to call 9-1-1; +2 max assist floor>stand with UE support 01/19/2023 Goal status: GOAL NOT MET 01/19/2023  **UPDATED GOALS BELOW ** per recert 05/17/2023  SHORT TERM GOALS: Target date: 02/25/2023   Pt  will perform HEP to maintain lower body strength, flexibility, posture with wife's assistance. Baseline:  Goal status: met 04/12/23  2. Pt will perform sit to stand, 8 of 10 reps with BUE support, with supervision, to maintain safety with transfers.  Baseline: min guard  Goal status: 02/07/23-CGA from elevated surface and max VC to lean forward, ongoing   LONG TERM GOALS: Target date: 07/18/23   Patient will perform HEP progression to maintain lower body strength, flexibility, posture with wife's assistance. Baseline:  Goal status: ongoing 04/12/23  2.  Pt will ambulate 150-175 ft in 2 MWT with min guard assist, to preserve functional mobility within the home.  Baseline: Goal status:  INITIAL  3. Wife will verbalize tips for fall prevention education to minimize fall frequency Baseline:  Goal status: ongoing 04/12/23   ASSESSMENT:  CLINICAL IMPRESSION:  Session focused on improving stepping to try and reduce shuffling and improve step clearance. By end of session had difficulty maintaining attention to amb around gym without distraction  and follow PT commands. Continued to work on sit to stands -- pt requires cueing to keep forward weight shift long enough to get his body weight over his feet prior to coming to full hip/trunk extension.   OBJECTIVE IMPAIRMENTS: Abnormal gait, decreased activity tolerance, decreased balance, decreased cognition, decreased coordination, decreased endurance, decreased mobility, difficulty walking, decreased ROM, decreased strength, decreased safety awareness, impaired flexibility, impaired UE functional use, improper body mechanics, and postural dysfunction.   PLAN:  PT FREQUENCY: 1x/week  PT DURATION: 12 weeks  PLANNED INTERVENTIONS: Therapeutic exercises, Therapeutic activity, Neuromuscular re-education, Balance training, Gait training, Patient/Family education, Self Care, Joint mobilization, Stair training, Moist heat, and Manual therapy  PLAN FOR NEXT SESSION:   Will continue to try to help him and his wife stay independent and functioning at home  Patient Details  Name: William [name] MRN: [id number] Date of Birth: 1941/04/09 Referring Provider:  [name], *  Encounter Date: 06/21/2023   Christene April Honor LITTIE Starring, PT 06/21/2023, 10:59 AM  [facility] at [facility]. [place], [place], [address] Phone: [phone]   Fax:  [phone]

## 2023-06-22 LAB — COMPREHENSIVE METABOLIC PANEL
ALT: 7 U/L (ref 0–53)
AST: 11 U/L (ref 0–37)
Albumin: 4.1 g/dL (ref 3.5–5.2)
Alkaline Phosphatase: 135 U/L — ABNORMAL HIGH (ref 39–117)
BUN: 24 mg/dL — ABNORMAL HIGH (ref 6–23)
CO2: 30 meq/L (ref 19–32)
Calcium: 8.9 mg/dL (ref 8.4–10.5)
Chloride: 106 meq/L (ref 96–112)
Creatinine, Ser: 1.78 mg/dL — ABNORMAL HIGH (ref 0.40–1.50)
GFR: 35.48 mL/min — ABNORMAL LOW (ref >60.00)
Glucose, Bld: 126 mg/dL — ABNORMAL HIGH (ref 70–99)
Potassium: 4.1 meq/L (ref 3.5–5.1)
Sodium: 143 meq/L (ref 135–145)
Total Bilirubin: 0.4 mg/dL (ref 0.2–1.2)
Total Protein: 6.6 g/dL (ref 6.0–8.3)

## 2023-06-22 LAB — CBC WITH DIFFERENTIAL/PLATELET
Basophils Absolute: 0 10*3/uL (ref 0.0–0.1)
Basophils Relative: 0.6 % (ref 0.0–3.0)
Eosinophils Absolute: 0.1 10*3/uL (ref 0.0–0.7)
Eosinophils Relative: 1.6 % (ref 0.0–5.0)
HCT: 38.1 % — ABNORMAL LOW (ref 39.0–52.0)
Hemoglobin: 12.8 g/dL — ABNORMAL LOW (ref 13.0–17.0)
Lymphocytes Relative: 19 % (ref 12.0–46.0)
Lymphs Abs: 1.4 10*3/uL (ref 0.7–4.0)
MCHC: 33.5 g/dL (ref 30.0–36.0)
MCV: 99.4 fL (ref 78.0–100.0)
Monocytes Absolute: 0.5 10*3/uL (ref 0.1–1.0)
Monocytes Relative: 6.7 % (ref 3.0–12.0)
Neutro Abs: 5.3 10*3/uL (ref 1.4–7.7)
Neutrophils Relative %: 72.1 % (ref 43.0–77.0)
Platelets: 203 10*3/uL (ref 150.0–400.0)
RBC: 3.83 Mil/uL — ABNORMAL LOW (ref 4.22–5.81)
RDW: 13.8 % (ref 11.5–15.5)
WBC: 7.3 10*3/uL (ref 4.0–10.5)

## 2023-06-22 LAB — TSH: TSH: 0.96 u[IU]/mL (ref 0.35–5.50)

## 2023-06-22 LAB — LIPID PANEL
Cholesterol: 199 mg/dL (ref 0–200)
HDL: 54.6 mg/dL (ref >39.00)
LDL Cholesterol: 126 mg/dL — ABNORMAL HIGH (ref 0–99)
NonHDL: 144.45
Total CHOL/HDL Ratio: 4
Triglycerides: 93 mg/dL (ref 0.0–149.0)
VLDL: 18.6 mg/dL (ref 0.0–40.0)

## 2023-06-28 ENCOUNTER — Encounter: Payer: Self-pay | Admitting: Physical Therapy

## 2023-06-28 ENCOUNTER — Ambulatory Visit: Payer: PPO | Admitting: Physical Therapy

## 2023-06-28 DIAGNOSIS — M6281 Muscle weakness (generalized): Secondary | ICD-10-CM | POA: Diagnosis not present

## 2023-06-28 DIAGNOSIS — Z9181 History of falling: Secondary | ICD-10-CM

## 2023-06-28 DIAGNOSIS — R262 Difficulty in walking, not elsewhere classified: Secondary | ICD-10-CM

## 2023-06-28 DIAGNOSIS — R296 Repeated falls: Secondary | ICD-10-CM

## 2023-06-28 DIAGNOSIS — R2689 Other abnormalities of gait and mobility: Secondary | ICD-10-CM

## 2023-06-28 DIAGNOSIS — I69354 Hemiplegia and hemiparesis following cerebral infarction affecting left non-dominant side: Secondary | ICD-10-CM

## 2023-06-28 NOTE — Therapy (Signed)
 OUTPATIENT PHYSICAL THERAPY NEURO TREATMENT  Patient Name: William [name] MRN: [id number] DOB:[birth year], 83 y.o., male Today's Date: 06/28/2023  PCP: [name]  DO REFERRING PROVIDER: same   END OF SESSION:  PT End of Session - 06/28/23 1543     Visit Number [id number]    Date for PT Re-Evaluation 07/18/23    Authorization Type HTA    PT Start Time 1535    PT Stop Time 1615    PT Time Calculation (min) 40 min    Activity Tolerance Patient tolerated treatment well    Behavior During Therapy [facility] for tasks assessed/performed              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   HTN (hypertension) 07/19/2015   Hyperlipidemia    Major neurocognitive disorder 01/09/2014   Mild, related to stroke history   Nocturia    Renal insufficiency 06/25/2013   S/P carotid endarterectomy    BILATERAL ICA--  PATENT PER DUPLEX  05-19-2012   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/26/2017   Right calf   Urinary frequency    Vitamin D  deficiency    Past Surgical History:  Procedure Laterality Date   APPENDECTOMY  AS CHILD   CARDIOVASCULAR STRESS TEST  03-27-2012  DR CRENSHAW   LOW RISK LEXISCAN  STUDY-- PROBABLE NORMAL PERFUSION AND SOFT TISSUE ATTENUATION/  NO ISCHEMIA/ EF 51%   CAROTID ENDARTERECTOMY Bilateral LEFT  11-12-2008  DR GREG HAYES   RIGHT ICA  2006  ([facility])   CHOLECYSTECTOMY N/A 02/23/2022   Procedure: LAPAROSCOPIC CHOLECYSTECTOMY;  Surgeon: [name], MD;  Location: [facility] OR;  Service: General;  Laterality: N/A;   CYSTOSCOPY W/ RETROGRADES  Bilateral 06/22/2021   Procedure: CYSTOSCOPY WITH RETROGRADE PYELOGRAM;  Surgeon: [name], MD;  Location: [facility];  Service: Urology;  Laterality: Bilateral;   CYSTOSCOPY WITH LITHOLAPAXY N/A 02/26/2013   Procedure: CYSTOSCOPY WITH LITHOLAPAXY;  Surgeon: Senior Matilda, MD;  Location: [facility];  Service: Urology;  Laterality: N/A;   ENDOSCOPIC RETROGRADE CHOLANGIOPANCREATOGRAPHY (ERCP) WITH PROPOFOL  N/A 02/22/2022   Procedure: ENDOSCOPIC RETROGRADE CHOLANGIOPANCREATOGRAPHY (ERCP) WITH PROPOFOL ;  Surgeon: [name], MD;  Location: [facility] ENDOSCOPY;  Service: Gastroenterology;  Laterality: N/A;   EYE SURGERY  Jan. 2016   cataract surgery both eyes   INGUINAL HERNIA REPAIR Right 11-08-2006   IR KYPHO EA ADDL LEVEL THORACIC OR LUMBAR  02/12/2021   IR RADIOLOGIST EVAL & MGMT  02/18/2021   MASS EXCISION N/A 03/03/2016   Procedure: EXCISION OF BACK  MASS;  Surgeon: [name], MD;  Location: [facility];  Service: General;  Laterality: N/A;   MOHS SURGERY Left 1/ 2016   [name]-- Basal cell   PROSTATE SURGERY     REMOVAL OF STONES  02/22/2022   Procedure: REMOVAL OF STONES;  Surgeon: [name], MD;  Location: [facility] ENDOSCOPY;  Service: Gastroenterology;;   [name]  02/22/2022   Procedure: [name];  Surgeon: [name], MD;  Location: [facility]  ENDOSCOPY;  Service: Gastroenterology;;   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 N/A 06/22/2021   Procedure: TRANSURETHRAL RESECTION OF BLADDER TUMOR;  Surgeon: [name], MD;  Location: [facility];  Service: Urology;  Laterality: N/A;   TRANSURETHRAL RESECTION OF PROSTATE N/A 02/26/2013   Procedure: TRANSURETHRAL RESECTION OF THE PROSTATE WITH GYRUS INSTRUMENTS;  Surgeon: Senior Matilda, MD;  Location: [facility];  Service: Urology;  Laterality: N/A;   TRANSURETHRAL RESECTION OF PROSTATE N/A 06/22/2021   Procedure: TRANSURETHRAL  RESECTION OF THE PROSTATE (TURP);  Surgeon: [name], MD;  Location: [facility];  Service: Urology;  Laterality: N/A;   Patient Active Problem List   Diagnosis Date Noted   Bronchitis 06/16/2023   Impacted cerumen of left ear 06/16/2023   Acute encephalopathy 04/18/2023   Parkinson's disease (HCC) 04/18/2023   History of CVA with residual deficit 04/18/2023   Chronic kidney disease, stage 3b (HCC) 04/18/2023   Dementia with behavioral disturbance (HCC) 04/18/2023   Anemia of chronic disease 04/18/2023   Laceration of right upper extremity 02/24/2023   Dermatitis 02/24/2023   Lung nodules 02/24/2023   Dysuria 08/03/2022   Nonintractable headache 07/01/2022   Bilateral impacted cerumen 06/24/2022   Rash 06/15/2022   Postoperative ileus (HCC) 02/27/2022   Ileus, postoperative (HCC) 02/26/2022   Choledocholithiasis 02/19/2022   DNR (do not resuscitate) 02/19/2022   Left elbow pain 01/26/2022   Mid back pain on left side 01/26/2022   Rib pain 01/26/2022   Acute pain of left shoulder 11/12/2021   Leukocytes in urine 11/12/2021   Urinary frequency 11/12/2021   Thrush 10/08/2021   Hemiplegia, 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   Insomnia    Prediabetes    Acute 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 (HCC)    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   Transaminitis 07/27/2021   UTI (urinary tract infection) 07/27/2021   CVA (cerebral vascular accident) (HCC) 07/27/2021   Fall 07/27/2021   Hyperglycemia 07/27/2021   Cholelithiasis 07/27/2021   Hypoxia 07/27/2021   Nausea and vomiting 07/27/2021   Acute metabolic encephalopathy 07/27/2021   Normocytic anemia 07/27/2021   Chronic back pain 07/27/2021   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   Closed fracture of first lumbar vertebra with routine healing 02/03/2021   Closed fracture of multiple ribs 11/18/2020   Anxiety  01/29/2020   Leg pain, bilateral 01/29/2020   Ingrown toenail 07/13/2019   Lumbar spondylosis 05/02/2018   Pain in left knee 03/09/2018   Osteoarthritis of left hip 01/16/2018   Trochanteric bursitis of left hip 01/16/2018   Preventative health care 09/26/2017   HTN (hypertension) 07/19/2015   Hyperlipidemia 07/19/2015   Great toe pain 02/11/2014   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   Obesity (BMI 30-39.9) 06/25/2013   Renal insufficiency 06/25/2013   Weakness of left arm 06/25/2013   Sebaceous cyst 03/03/2011   Sprain of lumbar region 07/31/2010   Rib pain, left 08/29/2009   Carotid artery stenosis, asymptomatic, bilateral 05/02/2009   Eczema, atopic 05/31/2008   Vitamin D  deficiency 03/01/2008   BPH (benign prostatic hyperplasia) 08/06/2007   Fasting hyperglycemia 12/21/2006   History of right MCA infarct 2006    ONSET DATE: 06/29/22 REFERRING DIAG:  Diagnosis  I63.9 (ICD-10-CM) - Cerebrovascular accident (CVA), unspecified mechanism (HCC)  W19.XXXD (ICD-10-CM) - Fall, subsequent encounter    THERAPY DIAG:  Muscle weakness (generalized)  Other abnormalities of gait and mobility  Hemiplegia  and hemiparesis following cerebral infarction affecting left non-dominant side (HCC)  History of falling  Difficulty in walking, not elsewhere classified  Repeated falls  Rationale for Evaluation and Treatment: Rehabilitation  SUBJECTIVE:                                                                                                                                                                                         SUBJECTIVE STATEMENT:  No falls, wife reports an increase in c/o back pain and left shoulder pain, as well as him holding the left arm in a spastic state  PERTINENT HISTORY:  ARSENIO SCHNORR is a 83 year old man with dementia, CKD, HTN, CAD, HLD and history of CVA  (2006, 2023), Parkinson's  PAIN:  Are you having pain? Faces/behavior scale- mild  pain BLEs   PRECAUTIONS: Fall  WEIGHT BEARING RESTRICTIONS: No  FALLS: Has patient fallen in last 6 months? No  LIVING ENVIRONMENT: Lives with: lives with their family and lives with their spouse Lives in: House/apartment Stairs:  1 brick high step Has following equipment at home: Environmental Consultant - 2 wheeled, Wheelchair (manual), Shower bench, bed side commode, Grab bars, and hospital bed, sliding pad for car  PLOF: Independent with basic ADLs and Independent with household mobility without device  PATIENT GOALS: Walk around the block with his wife. Be able to use the dining room chair rather than have to use the W/C. Increased I with bed mobility and simple tasks at home like make some coffee or brush his teeth, Step over tub to shower, has bench, but he can't really use it. Up and down the one step to get to back deck. 11/23/22: Patient's wife Devere updated his goals today: Walk around store (gets distracted), swing a golf club again (thinking of driving range), get up on his own, walk without leaning forward, more recognition of the left side (from CVA), not need gait belt, OT-- be able to get dressed more on his own, navigate the newspaper.  OBJECTIVE:  TREATMENT: 06/28/23 Nustep level 4 x 7 minutes PT passive stretch of the left fingers, left wrist, left elbow and left shoulder Some AAROM of the left elbow and shoulder Some STM to the left upper trap and left forearm Heat on the back while doing the above UBE level 3 x 6 minutes Seated ball toss Standing ball toss and ball kicks  06/21/23 Seated PWR! Sit<>stand x10 Standing stepping on to numbers on floor R&L Side lunge on to numbers and then performed with cognitive tasks (I.e. adding/subtracting) High knees up into pillow Side kick into pillow Punching into pillow Box stepping on numbers with HHA  CW & CCW Amb x 4 laps around gym with gait belt, CGA for safety (too much forward lean)   06/14/23 Nustep level 5 x 7 minutes Bike  level 4 x 6 minutes Gait with HHA and belt outside asphalt 200 feet rest then 300 feet Standing balance touching numbers on wall did miss some on the left side needed some cues, to look to the left and to not hold on, did right arm reaching and left arm reaching [employer] Side stepping, backward walking Gait outside in the grass x 200 feet  05/31/23 Nustep level 5 x 7 minutes Gait outside with HHA 200 feet, rest and then 300 feet. Standing ball toss Ball kicks 2.5# LAQ, marches Side stepping, forward and backward walking In pbars hip abduction In pbars step turn reach On bike level 4 x 6 minutes Josiephine with HHA x 200 feet  05/26/23 Bike level 4 x 5 minutes Nustep level 5 x 5 minutes Side stepping, step turn reaches [employer], volley ball [employer] Walking 200 feet x 2 Sit to stand with verbal cues Gentle Passive ROM left shoulder Red tband HS curls   05/17/23 Nustep level 5 x 5 mintues TUG 40 seconds, had a very difficult time getting up from sitting, needed cues 5XSTS: 75 seconds with CGA and a lot of verbal cues HHA walking with some cues 330 feet Standing marching, hip abduction In bars side stepping Ball toss, ball hits  Practiced sit to stands needing help with the left hand and getting his weight forward  04/12/23 Nustep level 5 x 5 minutes Gait outside around the back parking [place], rested and then from there around to the front of the building Standing ball toss, ball hits and then ball kicks, some SBA In pbars marching, abduction and extension In pbars step reach a lot of cues needed for this today Gait out the back door around in the grass and uneven terrain to the car  04/05/23 UBE level 4 x 6 minutes Gait HHA walking outside a lot of cues for step length around the building to the front door [employer] kicks Side step in pbars Step turn reach in pbars AAROM left shoulder' Isometrics left shoulder  03/29/23 HHA working on amb with  increased stride and safety - pt tends to vary speeds and lean fwd cuing needed STS with wt ball press HHA 6 in step tap 20 x with HHA then 20x without - min A with several LOB Func balance ex carrying obj Batting ball in standing Nustep L 5 6 min PWR moves sitting and standing with cuing Boxing  03/24/23 NuStep L5x55mins UBE L2 x58mins each way  Side stepping Marches Hip abduction STS minA from elevated mat- does not stand all the way up and needs one hand push off  Seated ball hits ad kicks  Seated chest press with 3# WaTe 2x10  03/15/23 Nustep level 5 x 7 minutes Gait outside with CGA and HHA multiple rests and redirection for big step Stairs step over step up and down with cues and CGA Seated volleyball hands and then kicks Side stepping Marches Hip abduction Fast walking with HHA  PATIENT EDUCATION: Education details: none new 02/16/23 Person educated: Patient and Spouse Education method: Explanation, Demonstration, Tactile cues, and Verbal cues Education comprehension: verbalized understanding, returned demonstration, verbal cues required, tactile cues required, and needs further education   ------------------------------------------------------------------------------------------------------- (Measures in this section from initial evaluation unless otherwise noted) DIAGNOSTIC FINDINGS:  MRI 2021  with degenerative changes in lumbar spine, L3-4 R subarticular stenosis COGNITION: Overall cognitive status: History of cognitive impairments - at baseline SENSATION: Not tested COORDINATION: Moderately impaired BLE MUSCLE LENGTH: Hamstrings: severely restricted B Thomas test: Severely restricted B. POSTURE: rounded shoulders, forward head, increased thoracic kyphosis, posterior pelvic tilt, and flexed trunk  LOWER EXTREMITY ROM:   BLE extremely stiff throughout, limited hip ROM in all planes, B knees limited in extension. LOWER EXTREMITY MMT:  3+/5 throughout. Unable to  determine accuracy of MMT due to cognition. Functionally demosntrates poor coordinated activation and poor muscular endurance. BED MOBILITY: Min A for Supine<> Sit. Patient was using a ladder in his hospital bed and could move MI. TRANSFERS: Min A, mod VC, tends to lean back.  11/02/22 independent with use of hands on arm chair with a couple of tries CURB: Min A-Has one step out to his deck. STAIRS: N/A  GAIT: Gait pattern: step to pattern, decreased arm swing- Right, decreased arm swing- Left, decreased step length- Right, decreased step length- Left, shuffling, festinating, trunk flexed, narrow BOS, poor foot clearance- Right, and poor foot clearance- Left Distance walked: 102' Assistive device utilized: Environmental Consultant - 2 wheeled Level of assistance: Occasional min A Comments: mod VC to stay close to walker, take longer steps. FUNCTIONAL TESTS:  5 times sit to stand: 38.23  09/29/22  could not performs 5XSTS, 11/02/22 = 47 seconds with cues to use arms Timed up and go (TUG): 47.91  with RW, 07/21/22 TUG 29  seconds without device, 09/29/22 25 seconds, 11/02/22 TUG 20 seconds                                                                     PATIENT EDUCATION: Education details: Progress towards goals, POC Person educated: Patient and Spouse Education method: Programmer, Multimedia, Facilities Manager, and Verbal cues  Education comprehension: verbalized understanding, returned demonstration, verbal cues required, and needs further education  HOME EXERCISE PROGRAM: PWR! moves  GOALS: Goals reviewed with patient? Yes  SHORT TERM GOALS: Target date: 12/22/22  Perform HEP for PWR! Moves with cues from family. Baseline: Pt's wife, Devere, notes walking and hitting beach ball as main HEP. Goal status: GOAL MET, 12/20/2022   2. Decrease 5 x STS to < 35 sec to demonstrate improved functional strength  Baseline: 47 seconds on 5/21 per note; 34.5 sec UE support 12/20/2022  Goal status: GOAL MET, 12/20/2022   3. Perform sit  <> stand transfers from average chair height with Supervision.  Baseline: CGA  12/20/2022; (01/10/23) CGA  Goal status: NOT MET  LONG TERM GOALS: Target date: 04/22/23  Patient will perform HEP progression with caregiver assist. Baseline: has minimal HEP Goal status: ONGOING  2.  Decrease 5 x STS to < 25 sec to demonstrate improved functional strength Baseline: 47 seconds on 5/21 per note; (01/10/23) 36 sec w/ cues for speed/sequence>30.38 sec with BUE supprot Goal status: PARTIALLY MET, 01/24/2023  3. Improve quality of gait to be able to ambulate x 100 feet with supervision. Baseline: CGA with cues  Goal status: REVISED  4.  Patient will perform his bed mobility with MI including rolling, sit<>supine, and scooting in bed. Baseline: on hard surface mat he is independent but on soft bed at home he struggles, wife  reports only needs a hand to get up now 11/02/22; (01/10/23) modified indep.  Goal status: MET  5.  Patient will demonstrate floor<>stand with CGA and verbal cues for patient's wife to be able to assist. Baseline:  Has to call 9-1-1; +2 max assist floor>stand with UE support 01/19/2023 Goal status: GOAL NOT MET 01/19/2023  **UPDATED GOALS BELOW ** per recert 05/17/2023  SHORT TERM GOALS: Target date: 02/25/2023   Pt will perform HEP to maintain lower body strength, flexibility, posture with wife's assistance. Baseline:  Goal status: met 04/12/23  2. Pt will perform sit to stand, 8 of 10 reps with BUE support, with supervision, to maintain safety with transfers.  Baseline: min guard  Goal status: 02/07/23-CGA from elevated surface and max VC to lean forward, ongoing   LONG TERM GOALS: Target date: 07/18/23   Patient will perform HEP progression to maintain lower body strength, flexibility, posture with wife's assistance. Baseline:  Goal status: ongoing 04/12/23  2.  Pt will ambulate 150-175 ft in 2 MWT with min guard assist, to preserve functional mobility within the  home.  Baseline: Goal status:  INITIAL  3. Wife will verbalize tips for fall prevention education to minimize fall frequency Baseline:  Goal status: ongoing 04/12/23   ASSESSMENT:  CLINICAL IMPRESSION:  Patient seems to hold the left UE in a spastic way, more difficult for me to get his coat off today, elbow flexed, fingers and wrist flexed.  I worked on some PROM of the fingers, wrist, elbow and shoulder with good results recommended spouse do some of this at home,   Session focused on improving stepping to try and reduce shuffling and improve step clearance. By end of session had difficulty maintaining attention to amb around gym without distraction and follow PT commands. Continued to work on sit to stands -- pt requires cueing to keep forward weight shift long enough to get his body weight over his feet prior to coming to full hip/trunk extension.   OBJECTIVE IMPAIRMENTS: Abnormal gait, decreased activity tolerance, decreased balance, decreased cognition, decreased coordination, decreased endurance, decreased mobility, difficulty walking, decreased ROM, decreased strength, decreased safety awareness, impaired flexibility, impaired UE functional use, improper body mechanics, and postural dysfunction.   PLAN:  PT FREQUENCY: 1x/week  PT DURATION: 12 weeks  PLANNED INTERVENTIONS: Therapeutic exercises, Therapeutic activity, Neuromuscular re-education, Balance training, Gait training, Patient/Family education, Self Care, Joint mobilization, Stair training, Moist heat, and Manual therapy  PLAN FOR NEXT SESSION:   Will continue to try to help him and his wife stay independent and functioning at home,   Patient Details  Name: SWAIN ACREE MRN: [id number] Date of Birth: 1941/02/05 Referring Provider:  [name], *  Encounter Date: 06/28/2023   OBADIAH OZELL ORN, PT 06/28/2023, 3:45 PM  [facility]. [place], [place], [address] Phone: [phone]   Fax:  [phone]

## 2023-06-29 ENCOUNTER — Encounter: Payer: Self-pay | Admitting: Family Medicine

## 2023-06-30 ENCOUNTER — Other Ambulatory Visit (HOSPITAL_BASED_OUTPATIENT_CLINIC_OR_DEPARTMENT_OTHER): Payer: Self-pay

## 2023-06-30 ENCOUNTER — Encounter (INDEPENDENT_AMBULATORY_CARE_PROVIDER_SITE_OTHER): Payer: Self-pay | Admitting: Otolaryngology

## 2023-06-30 ENCOUNTER — Other Ambulatory Visit: Payer: Self-pay | Admitting: Family Medicine

## 2023-06-30 ENCOUNTER — Other Ambulatory Visit: Payer: Self-pay

## 2023-06-30 DIAGNOSIS — H6123 Impacted cerumen, bilateral: Secondary | ICD-10-CM

## 2023-07-01 ENCOUNTER — Other Ambulatory Visit (HOSPITAL_BASED_OUTPATIENT_CLINIC_OR_DEPARTMENT_OTHER): Payer: Self-pay

## 2023-07-04 ENCOUNTER — Other Ambulatory Visit: Payer: Self-pay | Admitting: Family Medicine

## 2023-07-04 ENCOUNTER — Other Ambulatory Visit (HOSPITAL_COMMUNITY): Payer: Self-pay

## 2023-07-04 ENCOUNTER — Other Ambulatory Visit (HOSPITAL_BASED_OUTPATIENT_CLINIC_OR_DEPARTMENT_OTHER): Payer: Self-pay

## 2023-07-04 NOTE — Telephone Encounter (Signed)
Pharmacy comment: discontinued by ED doc 04/19/23 - wife is requesting refill for pt

## 2023-07-05 ENCOUNTER — Other Ambulatory Visit (HOSPITAL_BASED_OUTPATIENT_CLINIC_OR_DEPARTMENT_OTHER): Payer: Self-pay

## 2023-07-05 ENCOUNTER — Ambulatory Visit: Payer: PPO | Admitting: Physical Therapy

## 2023-07-05 MED ORDER — AMLODIPINE BESYLATE 10 MG PO TABS
10.0000 mg | ORAL_TABLET | Freq: Every morning | ORAL | 1 refills | Status: DC
  Filled 2023-07-05: qty 90, 90d supply, fill #0
  Filled 2023-10-03 (×2): qty 90, 90d supply, fill #1

## 2023-07-07 ENCOUNTER — Ambulatory Visit: Payer: PPO | Admitting: Physical Therapy

## 2023-07-07 DIAGNOSIS — R296 Repeated falls: Secondary | ICD-10-CM

## 2023-07-07 DIAGNOSIS — Z9181 History of falling: Secondary | ICD-10-CM

## 2023-07-07 DIAGNOSIS — I69354 Hemiplegia and hemiparesis following cerebral infarction affecting left non-dominant side: Secondary | ICD-10-CM

## 2023-07-07 DIAGNOSIS — R262 Difficulty in walking, not elsewhere classified: Secondary | ICD-10-CM

## 2023-07-07 DIAGNOSIS — M6281 Muscle weakness (generalized): Secondary | ICD-10-CM | POA: Diagnosis not present

## 2023-07-07 DIAGNOSIS — R2689 Other abnormalities of gait and mobility: Secondary | ICD-10-CM

## 2023-07-07 NOTE — Therapy (Signed)
OUTPATIENT PHYSICAL THERAPY NEURO TREATMENT  Patient Name: William [name] MRN: [id number] DOB:14-Mar-1941, 83 y.o., male Today's Date: 07/07/2023  PCP: Donato Schultz  DO REFERRING PROVIDER: same   END OF SESSION:  PT End of Session - 07/07/23 1440     Visit Number [id number]    Date for PT Re-Evaluation 07/18/23    Authorization Type HTA    PT Start Time 1450   late start due to pt needing to use restroom   PT Stop Time 1530    PT Time Calculation (min) 40 min    Equipment Utilized During Treatment Gait belt    Activity Tolerance Patient tolerated treatment well    Behavior During Therapy WFL for tasks assessed/performed               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   HTN (hypertension) 07/19/2015   Hyperlipidemia    Major neurocognitive disorder 01/09/2014   Mild, related to stroke history   Nocturia    Renal insufficiency 06/25/2013   S/P carotid endarterectomy    BILATERAL ICA--  PATENT PER DUPLEX  05-19-2012   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 09/26/2017   Right calf   Urinary frequency    Vitamin D deficiency    Past Surgical History:  Procedure Laterality Date   APPENDECTOMY  AS CHILD   CARDIOVASCULAR STRESS TEST  03-27-2012  DR CRENSHAW   LOW RISK LEXISCAN STUDY-- PROBABLE NORMAL PERFUSION AND SOFT TISSUE ATTENUATION/  NO ISCHEMIA/ EF 51%   CAROTID ENDARTERECTOMY Bilateral LEFT  11-12-2008  DR GREG HAYES   RIGHT ICA  2006  ([facility])   CHOLECYSTECTOMY N/A 02/23/2022   Procedure: LAPAROSCOPIC CHOLECYSTECTOMY;  Surgeon: Quentin Ore, MD;  Location: [facility] OR;  Service: General;  Laterality: N/A;   CYSTOSCOPY W/ RETROGRADES Bilateral 06/22/2021   Procedure: CYSTOSCOPY WITH RETROGRADE PYELOGRAM;  Surgeon: Marcine Matar, MD;  Location: [facility];  Service: Urology;  Laterality: Bilateral;   CYSTOSCOPY WITH LITHOLAPAXY N/A 02/26/2013   Procedure: CYSTOSCOPY WITH LITHOLAPAXY;  Surgeon: Marcine Matar, MD;  Location: [facility];  Service: Urology;  Laterality: N/A;   ENDOSCOPIC RETROGRADE CHOLANGIOPANCREATOGRAPHY (ERCP) WITH PROPOFOL N/A 02/22/2022   Procedure: ENDOSCOPIC RETROGRADE CHOLANGIOPANCREATOGRAPHY (ERCP) WITH PROPOFOL;  Surgeon: Jeani Hawking, MD;  Location: [facility] ENDOSCOPY;  Service: Gastroenterology;  Laterality: N/A;   EYE SURGERY  Jan. 2016   cataract surgery both eyes   INGUINAL HERNIA REPAIR Right 11-08-2006   IR KYPHO EA ADDL LEVEL THORACIC OR LUMBAR  02/12/2021   IR RADIOLOGIST EVAL & MGMT  02/18/2021   MASS EXCISION N/A 03/03/2016   Procedure: EXCISION OF BACK  MASS;  Surgeon: Almond Lint, MD;  Location: [facility];  Service: General;  Laterality: N/A;   MOHS SURGERY Left 1/ 2016   Dr Margo Aye-- Basal cell   PROSTATE SURGERY     REMOVAL OF STONES  02/22/2022   Procedure: REMOVAL OF STONES;  Surgeon: Jeani Hawking, MD;  Location: [facility] ENDOSCOPY;  Service: Gastroenterology;;   Dennison Mascot  02/22/2022   Procedure: Dennison Mascot;  Surgeon: Jeani Hawking, MD;  Location: [facility] ENDOSCOPY;  Service: Gastroenterology;;   TRANSURETHRAL RESECTION OF BLADDER TUMOR WITH MITOMYCIN-C N/A 06/22/2021   Procedure: TRANSURETHRAL RESECTION OF BLADDER TUMOR;  Surgeon: Marcine Matar, MD;  Location: [facility];  Service: Urology;  Laterality: N/A;   TRANSURETHRAL RESECTION OF PROSTATE N/A 02/26/2013   Procedure: TRANSURETHRAL RESECTION OF THE PROSTATE WITH GYRUS INSTRUMENTS;  Surgeon: Marcine Matar, MD;  Location: [facility];  Service: Urology;   Laterality: N/A;   TRANSURETHRAL RESECTION OF PROSTATE N/A 06/22/2021   Procedure: TRANSURETHRAL RESECTION OF THE PROSTATE (TURP);  Surgeon: Marcine Matar, MD;  Location: [facility];  Service: Urology;  Laterality: N/A;   Patient Active Problem List   Diagnosis Date Noted   Bronchitis 06/16/2023   Impacted cerumen of left ear 06/16/2023   Acute encephalopathy 04/18/2023   Parkinson's disease (HCC) 04/18/2023   History of CVA with residual deficit 04/18/2023   Chronic kidney disease, stage 3b (HCC) 04/18/2023   Dementia with behavioral disturbance (HCC) 04/18/2023   Anemia of chronic disease 04/18/2023   Laceration of right upper extremity 02/24/2023   Dermatitis 02/24/2023   Lung nodules 02/24/2023   Dysuria 08/03/2022   Nonintractable headache 07/01/2022   Bilateral impacted cerumen 06/24/2022   Rash 06/15/2022   Postoperative ileus (HCC) 02/27/2022   Ileus, postoperative (HCC) 02/26/2022   Choledocholithiasis 02/19/2022   DNR (do not resuscitate) 02/19/2022   Left elbow pain 01/26/2022   Mid back pain on left side 01/26/2022   Rib pain 01/26/2022   Acute pain of left shoulder 11/12/2021   Leukocytes in urine 11/12/2021   Urinary frequency 11/12/2021   Thrush 10/08/2021   Hemiplegia, 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   Insomnia    Prediabetes    Acute 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 (HCC)    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   Transaminitis 07/27/2021   UTI (urinary tract infection) 07/27/2021   CVA (cerebral vascular accident) (HCC) 07/27/2021   Fall 07/27/2021   Hyperglycemia 07/27/2021   Cholelithiasis 07/27/2021   Hypoxia 07/27/2021   Nausea and vomiting 07/27/2021   Acute metabolic encephalopathy 07/27/2021   Normocytic anemia 07/27/2021   Chronic back pain 07/27/2021   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   Closed fracture of first lumbar  vertebra with routine healing 02/03/2021   Closed fracture of multiple ribs 11/18/2020   Anxiety 01/29/2020   Leg pain, bilateral 01/29/2020   Ingrown toenail 07/13/2019   Lumbar spondylosis 05/02/2018   Pain in left knee 03/09/2018   Osteoarthritis of left hip 01/16/2018   Trochanteric bursitis of left hip 01/16/2018   Preventative health care 09/26/2017   HTN (hypertension) 07/19/2015   Hyperlipidemia 07/19/2015   Great toe pain 02/11/2014   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   Obesity (BMI 30-39.9) 06/25/2013   Renal insufficiency 06/25/2013   Weakness of left arm 06/25/2013   Sebaceous cyst 03/03/2011   Sprain of lumbar region 07/31/2010   Rib pain, left 08/29/2009   Carotid artery stenosis, asymptomatic, bilateral 05/02/2009   Eczema, atopic 05/31/2008   Vitamin D deficiency 03/01/2008   BPH (benign prostatic hyperplasia) 08/06/2007   Fasting hyperglycemia 12/21/2006   History of right MCA infarct 2006    ONSET DATE: 06/29/22 REFERRING DIAG:  Diagnosis  I63.9 (ICD-10-CM) - Cerebrovascular accident (CVA), unspecified mechanism (HCC)  W19.XXXD (ICD-10-CM) - Fall, subsequent  encounter    THERAPY DIAG:  Muscle weakness (generalized)  Other abnormalities of gait and mobility  Hemiplegia and hemiparesis following cerebral infarction affecting left non-dominant side (HCC)  History of falling  Difficulty in walking, not elsewhere classified  Repeated falls  Rationale for Evaluation and Treatment: Rehabilitation  SUBJECTIVE:                                                                                                                                                                                         SUBJECTIVE STATEMENT:  Pt reports "some" back pain but not too bad. States he still has L shoulder pain. Wife reports she has been trying to do PROM at home with pt's L UE; however, she states there are some morning she has to pry his hand open.    PERTINENT HISTORY:  BUNNY LOWDERMILK is a 83 year old man with dementia, CKD, HTN, CAD, HLD and history of CVA  (2006, 2023), Parkinson's  PAIN:  Are you having pain? Faces/behavior scale- mild pain BLEs   PRECAUTIONS: Fall  WEIGHT BEARING RESTRICTIONS: No  FALLS: Has patient fallen in last 6 months? No  LIVING ENVIRONMENT: Lives with: lives with their family and lives with their spouse Lives in: House/apartment Stairs:  1 brick high step Has following equipment at home: Environmental consultant - 2 wheeled, Wheelchair (manual), Shower bench, bed side commode, Grab bars, and hospital bed, sliding pad for car  PLOF: Independent with basic ADLs and Independent with household mobility without device  PATIENT GOALS: Walk around the block with his wife. Be able to use the dining room chair rather than have to use the W/C. Increased I with bed mobility and simple tasks at home like make some coffee or brush his teeth, Step over tub to shower, has bench, but he can't really use it. Up and down the one step to get to back deck. 11/23/22: Patient's wife [name] updated his goals today: Walk around store (gets distracted), swing a golf club again (thinking of driving range), get up on his own, walk without leaning forward, more recognition of the left side (from CVA), not need gait belt, OT-- be able to get dressed more on his own, navigate the newspaper.  OBJECTIVE:  TREATMENT: 07/07/23 Nustep L5 x 8 min for neural priming Sit<>stand with hands on fitter poles x15 Sitting and forward reach for lumbar mobility x10 Sitting hands on the side (L hand on ball to keep palm as open as possible due to inability to fully extend fingers and wrist to keep his hand flat on mat table) with scap squeeze/chest up to ceiling for trunk mobility x10 Sitting  and side reach and then rotation for trunk mobility and UE ROM 2x10 Standing big step forward with hands on fitter poles 2x10 Standing big side step L&R with hands on fitter  poles 2x10 Supine side step 2x10 L&R Supine butterfly stretch within tolerable range Supine LTR x10 Supine UE reach towards ceilings for trunk and UE ROM x10 Supine side reach towards walls for trunk and UE ROM x10 pt notes this hurt more Gentle PROM of L elbow and shoulder in supine Amb x3 laps with CGA, holding on pt's L hand to keep him from guarding/holding L UE in spastic flexion  06/28/23 Nustep level 4 x 7 minutes PT passive stretch of the left fingers, left wrist, left elbow and left shoulder Some AAROM of the left elbow and shoulder Some STM to the left upper trap and left forearm Heat on the back while doing the above UBE level 3 x 6 minutes Seated ball toss Standing ball toss and ball kicks  06/21/23 Seated PWR! Sit<>stand x10 Standing stepping on to numbers on floor R&L Side lunge on to numbers and then performed with cognitive tasks (I.e. adding/subtracting) High knees up into pillow Side kick into pillow Punching into pillow Box stepping on numbers with HHA CW & CCW Amb x 4 laps around gym with gait belt, CGA for safety (too much forward lean)   06/14/23 Nustep level 5 x 7 minutes Bike level 4 x 6 minutes Gait with HHA and belt outside asphalt 200 feet rest then 300 feet Standing balance touching numbers on wall did miss some on the left side needed some cues, to look to the left and to not hold on, did right arm reaching and left arm reaching [employer] Side stepping, backward walking Gait outside in the grass x 200 feet  05/31/23 Nustep level 5 x 7 minutes Gait outside with HHA 200 feet, rest and then 300 feet. Standing ball toss Ball kicks 2.5# LAQ, marches Side stepping, forward and backward walking In pbars hip abduction In pbars step turn reach On bike level 4 x 6 minutes [place] with HHA x 200 feet  05/26/23 Bike level 4 x 5 minutes Nustep level 5 x 5 minutes Side stepping, step turn reaches [employer], volley ball [employer] Walking  200 feet x 2 Sit to stand with verbal cues Gentle Passive ROM left shoulder Red tband HS curls   05/17/23 Nustep level 5 x 5 mintues TUG 40 seconds, had a very difficult time getting up from sitting, needed cues 5XSTS: 75 seconds with CGA and a lot of verbal cues HHA walking with some cues 330 feet Standing marching, hip abduction In bars side stepping Ball toss, ball hits  Practiced sit to stands needing help with the left hand and getting his weight forward  04/12/23 Nustep level 5 x 5 minutes Gait outside around the back parking [place], rested and then from there around to the front of the building Standing ball toss, ball hits and then ball kicks, some SBA In pbars marching, abduction and extension In pbars step reach a lot of cues needed for this today Gait out the back door around in the grass and uneven terrain to the car   PATIENT EDUCATION: Education details: none new 02/16/23 Person educated: Patient and Spouse Education method: Explanation, Demonstration, Tactile cues, and Verbal cues Education comprehension: verbalized understanding, returned demonstration, verbal cues required, tactile cues required, and needs further education   ------------------------------------------------------------------------------------------------------- (Measures in this section from initial evaluation unless  otherwise noted) DIAGNOSTIC FINDINGS:  MRI 2021 with degenerative changes in lumbar spine, L3-4 R subarticular stenosis COGNITION: Overall cognitive status: History of cognitive impairments - at baseline SENSATION: Not tested COORDINATION: Moderately impaired BLE MUSCLE LENGTH: Hamstrings: severely restricted B Thomas test: Severely restricted B. POSTURE: rounded shoulders, forward head, increased thoracic kyphosis, posterior pelvic tilt, and flexed trunk  LOWER EXTREMITY ROM:   BLE extremely stiff throughout, limited hip ROM in all planes, B knees limited in extension. LOWER  EXTREMITY MMT:  3+/5 throughout. Unable to determine accuracy of MMT due to cognition. Functionally demosntrates poor coordinated activation and poor muscular endurance. BED MOBILITY: Min A for Supine<> Sit. Patient was using a ladder in his hospital bed and could move MI. TRANSFERS: Min A, mod VC, tends to lean back.  11/02/22 independent with use of hands on arm chair with a couple of tries CURB: Min A-Has one step out to his deck. STAIRS: N/A  GAIT: Gait pattern: step to pattern, decreased arm swing- Right, decreased arm swing- Left, decreased step length- Right, decreased step length- Left, shuffling, festinating, trunk flexed, narrow BOS, poor foot clearance- Right, and poor foot clearance- Left Distance walked: 39' Assistive device utilized: Environmental consultant - 2 wheeled Level of assistance: Occasional min A Comments: mod VC to stay close to walker, take longer steps. FUNCTIONAL TESTS:  5 times sit to stand: 38.23  09/29/22  could not performs 5XSTS, 11/02/22 = 47 seconds with cues to use arms Timed up and go (TUG): 47.91  with RW, 07/21/22 TUG 29  seconds without device, 09/29/22 25 seconds, 11/02/22 TUG 20 seconds                                                                     PATIENT EDUCATION: Education details: Progress towards goals, POC Person educated: Patient and Spouse Education method: Programmer, multimedia, Facilities manager, and Verbal cues  Education comprehension: verbalized understanding, returned demonstration, verbal cues required, and needs further education  HOME EXERCISE PROGRAM: PWR! moves  GOALS: Goals reviewed with patient? Yes  SHORT TERM GOALS: Target date: 12/22/22  Perform HEP for PWR! Moves with cues from family. Baseline: Pt's wife, [name], notes walking and hitting beach ball as main HEP. Goal status: GOAL MET, 12/20/2022   2. Decrease 5 x STS to < 35 sec to demonstrate improved functional strength  Baseline: 47 seconds on 5/21 per note; 34.5 sec UE support 12/20/2022  Goal  status: GOAL MET, 12/20/2022   3. Perform sit <> stand transfers from average chair height with Supervision.  Baseline: CGA  12/20/2022; (01/10/23) CGA  Goal status: NOT MET  LONG TERM GOALS: Target date: 04/22/23  Patient will perform HEP progression with caregiver assist. Baseline: has minimal HEP Goal status: ONGOING  2.  Decrease 5 x STS to < 25 sec to demonstrate improved functional strength Baseline: 47 seconds on 5/21 per note; (01/10/23) 36 sec w/ cues for speed/sequence>30.38 sec with BUE supprot Goal status: PARTIALLY MET, 01/24/2023  3. Improve quality of gait to be able to ambulate x 100 feet with supervision. Baseline: CGA with cues  Goal status: REVISED  4.  Patient will perform his bed mobility with MI including rolling, sit<>supine, and scooting in bed. Baseline: on hard surface mat he is independent but on  soft bed at home he struggles, wife reports only needs a hand to get up now 11/02/22; (01/10/23) modified indep.  Goal status: MET  5.  Patient will demonstrate floor<>stand with CGA and verbal cues for patient's wife to be able to assist. Baseline:  Has to call 9-1-1; +2 max assist floor>stand with UE support 01/19/2023 Goal status: GOAL NOT MET 01/19/2023  **UPDATED GOALS BELOW ** per recert 05/17/2023  SHORT TERM GOALS: Target date: 02/25/2023   Pt will perform HEP to maintain lower body strength, flexibility, posture with wife's assistance. Baseline:  Goal status: met 04/12/23  2. Pt will perform sit to stand, 8 of 10 reps with BUE support, with supervision, to maintain safety with transfers.  Baseline: min guard  Goal status: 02/07/23-CGA from elevated surface and max VC to lean forward, ongoing   LONG TERM GOALS: Target date: 07/18/23   Patient will perform HEP progression to maintain lower body strength, flexibility, posture with wife's assistance. Baseline:  Goal status: ongoing 04/12/23  2.  Pt will ambulate 150-175 ft in 2 MWT with min guard assist, to preserve  functional mobility within the home.  Baseline: Goal status:  INITIAL  3. Wife will verbalize tips for fall prevention education to minimize fall frequency Baseline:  Goal status: ongoing 04/12/23   ASSESSMENT:  CLINICAL IMPRESSION:  Pt continues to guard L UE but did not report any pain during exercises today. Continued to work on stepping and reduce shuffling.  Patient comes in still holding the left UE in a spastic way. Improved after some gentle PROM/AROM; however, he is unable to fully extend his L hand and wrist. Included more stretching and rotation exercises to decrease pt's stiffness.   OBJECTIVE IMPAIRMENTS: Abnormal gait, decreased activity tolerance, decreased balance, decreased cognition, decreased coordination, decreased endurance, decreased mobility, difficulty walking, decreased ROM, decreased strength, decreased safety awareness, impaired flexibility, impaired UE functional use, improper body mechanics, and postural dysfunction.   PLAN:  PT FREQUENCY: 1x/week  PT DURATION: 12 weeks  PLANNED INTERVENTIONS: Therapeutic exercises, Therapeutic activity, Neuromuscular re-education, Balance training, Gait training, Patient/Family education, Self Care, Joint mobilization, Stair training, Moist heat, and Manual therapy  PLAN FOR NEXT SESSION:   Will continue to try to help him and his wife stay independent and functioning at home,   Patient Details  Name: HERSCHEL FLEAGLE MRN: [id number] Date of Birth: 07-Jan-1941 Referring Provider:  Donato Schultz, *  Encounter Date: 07/07/2023   Vernon Prey April Dell Ponto, PT 07/07/2023, 3:37 PM  [facility] at [facility] [address] [facility] [place], [place], [address] Phone: [phone]   Fax:  [phone]

## 2023-07-12 ENCOUNTER — Ambulatory Visit (HOSPITAL_COMMUNITY): Payer: PPO | Admitting: Psychiatry

## 2023-07-14 ENCOUNTER — Ambulatory Visit: Payer: PPO | Admitting: Physical Therapy

## 2023-07-14 ENCOUNTER — Encounter: Payer: Self-pay | Admitting: Physical Therapy

## 2023-07-14 DIAGNOSIS — M6281 Muscle weakness (generalized): Secondary | ICD-10-CM | POA: Diagnosis not present

## 2023-07-14 DIAGNOSIS — Z9181 History of falling: Secondary | ICD-10-CM

## 2023-07-14 DIAGNOSIS — M25512 Pain in left shoulder: Secondary | ICD-10-CM

## 2023-07-14 DIAGNOSIS — R2689 Other abnormalities of gait and mobility: Secondary | ICD-10-CM

## 2023-07-14 DIAGNOSIS — I69354 Hemiplegia and hemiparesis following cerebral infarction affecting left non-dominant side: Secondary | ICD-10-CM

## 2023-07-14 DIAGNOSIS — R296 Repeated falls: Secondary | ICD-10-CM

## 2023-07-14 DIAGNOSIS — R262 Difficulty in walking, not elsewhere classified: Secondary | ICD-10-CM

## 2023-07-14 NOTE — Therapy (Signed)
OUTPATIENT PHYSICAL THERAPY NEURO TREATMENT  Patient Name: William [name] MRN: [id number] DOB:26-May-1941, 83 y.o., male Today's Date: 07/14/2023  PCP: Donato Schultz  DO REFERRING PROVIDER: same   END OF SESSION:  PT End of Session - 07/14/23 1454     Visit Number [id number]    Date for PT Re-Evaluation 07/18/23    Authorization Type HTA    PT Start Time 1446    Equipment Utilized During Treatment Gait belt    Activity Tolerance Patient tolerated treatment well    Behavior During Therapy [facility] for tasks assessed/performed               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   HTN (hypertension) 07/19/2015   Hyperlipidemia    Major neurocognitive disorder 01/09/2014   Mild, related to stroke history   Nocturia    Renal insufficiency 06/25/2013   S/P carotid endarterectomy    BILATERAL ICA--  PATENT PER DUPLEX  05-19-2012   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/26/2017   Right calf   Urinary frequency    Vitamin D deficiency    Past Surgical History:  Procedure Laterality Date   APPENDECTOMY  AS CHILD   CARDIOVASCULAR STRESS TEST  03-27-2012  DR CRENSHAW   LOW RISK LEXISCAN STUDY-- PROBABLE NORMAL PERFUSION AND SOFT TISSUE ATTENUATION/  NO ISCHEMIA/ EF 51%   CAROTID ENDARTERECTOMY Bilateral LEFT  11-12-2008  DR GREG HAYES   RIGHT ICA  2006  ([facility])   CHOLECYSTECTOMY N/A 02/23/2022   Procedure: LAPAROSCOPIC CHOLECYSTECTOMY;  Surgeon: Quentin Ore, MD;  Location: [facility] OR;  Service: General;  Laterality: N/A;   CYSTOSCOPY W/ RETROGRADES Bilateral  06/22/2021   Procedure: CYSTOSCOPY WITH RETROGRADE PYELOGRAM;  Surgeon: Marcine Matar, MD;  Location: [facility];  Service: Urology;  Laterality: Bilateral;   CYSTOSCOPY WITH LITHOLAPAXY N/A 02/26/2013   Procedure: CYSTOSCOPY WITH LITHOLAPAXY;  Surgeon: Marcine Matar, MD;  Location: [facility];  Service: Urology;  Laterality: N/A;   ENDOSCOPIC RETROGRADE CHOLANGIOPANCREATOGRAPHY (ERCP) WITH PROPOFOL N/A 02/22/2022   Procedure: ENDOSCOPIC RETROGRADE CHOLANGIOPANCREATOGRAPHY (ERCP) WITH PROPOFOL;  Surgeon: Jeani Hawking, MD;  Location: [facility] ENDOSCOPY;  Service: Gastroenterology;  Laterality: N/A;   EYE SURGERY  Jan. 2016   cataract surgery both eyes   INGUINAL HERNIA REPAIR Right 11-08-2006   IR KYPHO EA ADDL LEVEL THORACIC OR LUMBAR  02/12/2021   IR RADIOLOGIST EVAL & MGMT  02/18/2021   MASS EXCISION N/A 03/03/2016   Procedure: EXCISION OF BACK  MASS;  Surgeon: Almond Lint, MD;  Location: [facility];  Service: General;  Laterality: N/A;   MOHS SURGERY Left 1/ 2016   Dr Margo Aye-- Basal cell   PROSTATE SURGERY     REMOVAL OF STONES  02/22/2022   Procedure: REMOVAL OF STONES;  Surgeon: Jeani Hawking, MD;  Location: [facility] ENDOSCOPY;  Service: Gastroenterology;;   William [name]  02/22/2022   Procedure: William [name];  Surgeon: Jeani Hawking, MD;  Location: [facility] ENDOSCOPY;  Service: Gastroenterology;;  TRANSURETHRAL RESECTION OF BLADDER TUMOR WITH MITOMYCIN-C N/A 06/22/2021   Procedure: TRANSURETHRAL RESECTION OF BLADDER TUMOR;  Surgeon: Marcine Matar, MD;  Location: [facility];  Service: Urology;  Laterality: N/A;   TRANSURETHRAL RESECTION OF PROSTATE N/A 02/26/2013   Procedure: TRANSURETHRAL RESECTION OF THE PROSTATE WITH GYRUS INSTRUMENTS;  Surgeon: Marcine Matar, MD;  Location: [facility];  Service: Urology;  Laterality: N/A;   TRANSURETHRAL RESECTION OF PROSTATE N/A 06/22/2021   Procedure: TRANSURETHRAL RESECTION OF THE  PROSTATE (TURP);  Surgeon: Marcine Matar, MD;  Location: [facility];  Service: Urology;  Laterality: N/A;   Patient Active Problem List   Diagnosis Date Noted   Bronchitis 06/16/2023   Impacted cerumen of left ear 06/16/2023   Acute encephalopathy 04/18/2023   Parkinson's disease (HCC) 04/18/2023   History of CVA with residual deficit 04/18/2023   Chronic kidney disease, stage 3b (HCC) 04/18/2023   Dementia with behavioral disturbance (HCC) 04/18/2023   Anemia of chronic disease 04/18/2023   Laceration of right upper extremity 02/24/2023   Dermatitis 02/24/2023   Lung nodules 02/24/2023   Dysuria 08/03/2022   Nonintractable headache 07/01/2022   Bilateral impacted cerumen 06/24/2022   Rash 06/15/2022   Postoperative ileus (HCC) 02/27/2022   Ileus, postoperative (HCC) 02/26/2022   Choledocholithiasis 02/19/2022   DNR (do not resuscitate) 02/19/2022   Left elbow pain 01/26/2022   Mid back pain on left side 01/26/2022   Rib pain 01/26/2022   Acute pain of left shoulder 11/12/2021   Leukocytes in urine 11/12/2021   Urinary frequency 11/12/2021   Thrush 10/08/2021   Hemiplegia, 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   Insomnia    Prediabetes    Acute 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 (HCC)    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   Transaminitis 07/27/2021   UTI (urinary tract infection) 07/27/2021   CVA (cerebral vascular accident) (HCC) 07/27/2021   Fall 07/27/2021   Hyperglycemia 07/27/2021   Cholelithiasis 07/27/2021   Hypoxia 07/27/2021   Nausea and vomiting 07/27/2021   Acute metabolic encephalopathy 07/27/2021   Normocytic anemia 07/27/2021   Chronic back pain 07/27/2021   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   Closed fracture of first lumbar vertebra with routine healing 02/03/2021   Closed fracture of multiple ribs 11/18/2020   Anxiety 01/29/2020   Leg  pain, bilateral 01/29/2020   Ingrown toenail 07/13/2019   Lumbar spondylosis 05/02/2018   Pain in left knee 03/09/2018   Osteoarthritis of left hip 01/16/2018   Trochanteric bursitis of left hip 01/16/2018   Preventative health care 09/26/2017   HTN (hypertension) 07/19/2015   Hyperlipidemia 07/19/2015   Great toe pain 02/11/2014   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   Obesity (BMI 30-39.9) 06/25/2013   Renal insufficiency 06/25/2013   Weakness of left arm 06/25/2013   Sebaceous cyst 03/03/2011   Sprain of lumbar region 07/31/2010   Rib pain, left 08/29/2009   Carotid artery stenosis, asymptomatic, bilateral 05/02/2009   Eczema, atopic 05/31/2008   Vitamin D deficiency 03/01/2008   BPH (benign prostatic hyperplasia) 08/06/2007   Fasting hyperglycemia 12/21/2006   History of right MCA infarct 2006    ONSET DATE: 06/29/22 REFERRING DIAG:  Diagnosis  I63.9 (ICD-10-CM) - Cerebrovascular accident (CVA), unspecified mechanism (HCC)  W19.XXXD (ICD-10-CM) - Fall, subsequent encounter    THERAPY DIAG:  Muscle weakness (generalized)  Other abnormalities of gait and mobility  Hemiplegia and hemiparesis following cerebral infarction affecting  left non-dominant side (HCC)  History of falling  Difficulty in walking, not elsewhere classified  Repeated falls  Acute pain of left shoulder  Rationale for Evaluation and Treatment: Rehabilitation  SUBJECTIVE:                                                                                                                                                                                         SUBJECTIVE STATEMENT:  Wife continues to report that left hand in a grasp position, he does elevate the shoulder and has some tension, when asked will say it is sore, no falls  PERTINENT HISTORY:  William [name] is a 83 year old man with dementia, CKD, HTN, CAD, HLD and history of CVA  (2006, 2023), Parkinson's  PAIN:  Are you  having pain? Faces/behavior scale- mild pain BLEs   PRECAUTIONS: Fall  WEIGHT BEARING RESTRICTIONS: No  FALLS: Has patient fallen in last 6 months? No  LIVING ENVIRONMENT: Lives with: lives with their family and lives with their spouse Lives in: House/apartment Stairs:  1 brick high step Has following equipment at home: Environmental consultant - 2 wheeled, Wheelchair (manual), Shower bench, bed side commode, Grab bars, and hospital bed, sliding pad for car  PLOF: Independent with basic ADLs and Independent with household mobility without device  PATIENT GOALS: Walk around the block with his wife. Be able to use the dining room chair rather than have to use the W/C. Increased I with bed mobility and simple tasks at home like make some coffee or brush his teeth, Step over tub to shower, has bench, but he can't really use it. Up and down the one step to get to back deck. 11/23/22: Patient's wife [name] updated his goals today: Walk around store (gets distracted), swing a golf club again (thinking of driving range), get up on his own, walk without leaning forward, more recognition of the left side (from CVA), not need gait belt, OT-- be able to get dressed more on his own, navigate the newspaper.  OBJECTIVE:  TREATMENT: 07/14/23 Nustep level 5 x 7 minutes UBE level 2 x 4 minutes Passive stretch hand, fingers, wrist, elbow and shoulder left Standing step turn reach touch Standing reaching for numbers on the wall with left hand and right Sit to stand with cues for both arms to push up. Gait outside with HHA 200 feet then rest and then 200 feet through some grass and uneven terrain to the car  07/07/23 Nustep L5 x 8 min for neural priming Sit<>stand with hands on fitter poles x15 Sitting and forward reach for lumbar mobility x10 Sitting hands on the side (L hand on  ball to keep palm as open as possible due to inability to fully extend fingers and wrist to keep his hand flat on mat table) with scap  squeeze/chest up to ceiling for trunk mobility x10 Sitting and side reach and then rotation for trunk mobility and UE ROM 2x10 Standing big step forward with hands on fitter poles 2x10 Standing big side step L&R with hands on fitter poles 2x10 Supine side step 2x10 L&R Supine butterfly stretch within tolerable range Supine LTR x10 Supine UE reach towards ceilings for trunk and UE ROM x10 Supine side reach towards walls for trunk and UE ROM x10 pt notes this hurt more Gentle PROM of L elbow and shoulder in supine Amb x3 laps with CGA, holding on pt's L hand to keep him from guarding/holding L UE in spastic flexion  06/28/23 Nustep level 4 x 7 minutes PT passive stretch of the left fingers, left wrist, left elbow and left shoulder Some AAROM of the left elbow and shoulder Some STM to the left upper trap and left forearm Heat on the back while doing the above UBE level 3 x 6 minutes Seated ball toss Standing ball toss and ball kicks  06/21/23 Seated PWR! Sit<>stand x10 Standing stepping on to numbers on floor R&L Side lunge on to numbers and then performed with cognitive tasks (I.e. adding/subtracting) High knees up into pillow Side kick into pillow Punching into pillow Box stepping on numbers with HHA CW & CCW Amb x 4 laps around gym with gait belt, CGA for safety (too much forward lean)   06/14/23 Nustep level 5 x 7 minutes Bike level 4 x 6 minutes Gait with HHA and belt outside asphalt 200 feet rest then 300 feet Standing balance touching numbers on wall did miss some on the left side needed some cues, to look to the left and to not hold on, did right arm reaching and left arm reaching [employer] Side stepping, backward walking Gait outside in the grass x 200 feet  05/31/23 Nustep level 5 x 7 minutes Gait outside with HHA 200 feet, rest and then 300 feet. Standing ball toss Ball kicks 2.5# LAQ, marches Side stepping, forward and backward walking In pbars hip  abduction In pbars step turn reach On bike level 4 x 6 minutes [place] with HHA x 200 feet  05/26/23 Bike level 4 x 5 minutes Nustep level 5 x 5 minutes Side stepping, step turn reaches [employer], volley ball [employer] Walking 200 feet x 2 Sit to stand with verbal cues Gentle Passive ROM left shoulder Red tband HS curls   05/17/23 Nustep level 5 x 5 mintues TUG 40 seconds, had a very difficult time getting up from sitting, needed cues 5XSTS: 75 seconds with CGA and a lot of verbal cues HHA walking with some cues 330 feet Standing marching, hip abduction In bars side stepping Ball toss, ball hits  Practiced sit to stands needing help with the left hand and getting his weight forward  04/12/23 Nustep level 5 x 5 minutes Gait outside around the back parking [place], rested and then from there around to the front of the building Standing ball toss, ball hits and then ball kicks, some SBA In pbars marching, abduction and extension In pbars step reach a lot of cues needed for this today Gait out the back door around in the grass and uneven terrain to the car   PATIENT EDUCATION: Education details: none new 02/16/23 Person educated: Patient and Spouse  Education method: Explanation, Demonstration, Tactile cues, and Verbal cues Education comprehension: verbalized understanding, returned demonstration, verbal cues required, tactile cues required, and needs further education   ------------------------------------------------------------------------------------------------------- (Measures in this section from initial evaluation unless otherwise noted) DIAGNOSTIC FINDINGS:  MRI 2021 with degenerative changes in lumbar spine, L3-4 R subarticular stenosis COGNITION: Overall cognitive status: History of cognitive impairments - at baseline SENSATION: Not tested COORDINATION: Moderately impaired BLE MUSCLE LENGTH: Hamstrings: severely restricted B Thomas test: Severely restricted  B. POSTURE: rounded shoulders, forward head, increased thoracic kyphosis, posterior pelvic tilt, and flexed trunk  LOWER EXTREMITY ROM:   BLE extremely stiff throughout, limited hip ROM in all planes, B knees limited in extension. LOWER EXTREMITY MMT:  3+/5 throughout. Unable to determine accuracy of MMT due to cognition. Functionally demosntrates poor coordinated activation and poor muscular endurance. BED MOBILITY: Min A for Supine<> Sit. Patient was using a ladder in his hospital bed and could move MI. TRANSFERS: Min A, mod VC, tends to lean back.  11/02/22 independent with use of hands on arm chair with a couple of tries CURB: Min A-Has one step out to his deck. STAIRS: N/A  GAIT: Gait pattern: step to pattern, decreased arm swing- Right, decreased arm swing- Left, decreased step length- Right, decreased step length- Left, shuffling, festinating, trunk flexed, narrow BOS, poor foot clearance- Right, and poor foot clearance- Left Distance walked: 63' Assistive device utilized: Environmental consultant - 2 wheeled Level of assistance: Occasional min A Comments: mod VC to stay close to walker, take longer steps. FUNCTIONAL TESTS:  5 times sit to stand: 38.23  09/29/22  could not performs 5XSTS, 11/02/22 = 47 seconds with cues to use arms Timed up and go (TUG): 47.91  with RW, 07/21/22 TUG 29  seconds without device, 09/29/22 25 seconds, 11/02/22 TUG 20 seconds                                                                     PATIENT EDUCATION: Education details: Progress towards goals, POC Person educated: Patient and Spouse Education method: Programmer, multimedia, Facilities manager, and Verbal cues  Education comprehension: verbalized understanding, returned demonstration, verbal cues required, and needs further education  HOME EXERCISE PROGRAM: PWR! moves  GOALS: Goals reviewed with patient? Yes  SHORT TERM GOALS: Target date: 12/22/22  Perform HEP for PWR! Moves with cues from family. Baseline: Pt's wife, [name],  notes walking and hitting beach ball as main HEP. Goal status: GOAL MET, 12/20/2022   2. Decrease 5 x STS to < 35 sec to demonstrate improved functional strength  Baseline: 47 seconds on 5/21 per note; 34.5 sec UE support 12/20/2022  Goal status: GOAL MET, 12/20/2022   3. Perform sit <> stand transfers from average chair height with Supervision.  Baseline: CGA  12/20/2022; (01/10/23) CGA  Goal status: NOT MET  LONG TERM GOALS: Target date: 04/22/23  Patient will perform HEP progression with caregiver assist. Baseline: has minimal HEP Goal status: ONGOING  2.  Decrease 5 x STS to < 25 sec to demonstrate improved functional strength Baseline: 47 seconds on 5/21 per note; (01/10/23) 36 sec w/ cues for speed/sequence>30.38 sec with BUE supprot Goal status: PARTIALLY MET, 01/24/2023  3. Improve quality of gait to be able to ambulate x 100 feet with supervision.  Baseline: CGA with cues  Goal status: REVISED  4.  Patient will perform his bed mobility with MI including rolling, sit<>supine, and scooting in bed. Baseline: on hard surface mat he is independent but on soft bed at home he struggles, wife reports only needs a hand to get up now 11/02/22; (01/10/23) modified indep.  Goal status: MET  5.  Patient will demonstrate floor<>stand with CGA and verbal cues for patient's wife to be able to assist. Baseline:  Has to call 9-1-1; +2 max assist floor>stand with UE support 01/19/2023 Goal status: GOAL NOT MET 01/19/2023  **UPDATED GOALS BELOW ** per recert 05/17/2023  SHORT TERM GOALS: Target date: 02/25/2023   Pt will perform HEP to maintain lower body strength, flexibility, posture with wife's assistance. Baseline:  Goal status: met 04/12/23  2. Pt will perform sit to stand, 8 of 10 reps with BUE support, with supervision, to maintain safety with transfers.  Baseline: min guard  Goal status: 02/07/23-CGA from elevated surface and max VC to lean forward, ongoing   LONG TERM GOALS: Target date:  07/18/23   Patient will perform HEP progression to maintain lower body strength, flexibility, posture with wife's assistance. Baseline:  Goal status: ongoing 04/12/23  2.  Pt will ambulate 150-175 ft in 2 MWT with min guard assist, to preserve functional mobility within the home.  Baseline: Goal status:  INITIAL  3. Wife will verbalize tips for fall prevention education to minimize fall frequency Baseline:  Goal status: ongoing 04/12/23   ASSESSMENT:  CLINICAL IMPRESSION:  Pt continues to guard L UE , I worked on the hand, fingers and shoulder and got very good ROM, he does tend to elevate the shoulder so had him do some easy shoulder exercises to see if we could loosen this up.  He did well with the walk if I held the left hand at his side, still with the shuffling patter, and when distracted this gets worse, one instance of a stumble with Mod A by PT to correct.   Included more stretching and rotation exercises to decrease pt's stiffness.   OBJECTIVE IMPAIRMENTS: Abnormal gait, decreased activity tolerance, decreased balance, decreased cognition, decreased coordination, decreased endurance, decreased mobility, difficulty walking, decreased ROM, decreased strength, decreased safety awareness, impaired flexibility, impaired UE functional use, improper body mechanics, and postural dysfunction.   PLAN:  PT FREQUENCY: 1x/week  PT DURATION: 12 weeks  PLANNED INTERVENTIONS: Therapeutic exercises, Therapeutic activity, Neuromuscular re-education, Balance training, Gait training, Patient/Family education, Self Care, Joint mobilization, Stair training, Moist heat, and Manual therapy  PLAN FOR NEXT SESSION:   Will continue to try to help him and his wife stay independent and functioning at home,   Patient Details  Name: William [name] MRN: [id number] Date of Birth: Jan 15, 1941 Referring Provider:  Donato Schultz, *  Encounter Date: 07/14/2023   Jearld Lesch, PT 07/14/2023, 2:55  PM  [facility] [address] [facility] [place], [place], [address] Phone: [phone]   Fax:  [phone]

## 2023-07-19 ENCOUNTER — Ambulatory Visit: Payer: Self-pay | Admitting: Licensed Clinical Social Worker

## 2023-07-19 ENCOUNTER — Ambulatory Visit: Payer: PPO | Admitting: Cardiology

## 2023-07-19 NOTE — Patient Instructions (Signed)
Visit Information  Thank you for taking time to visit with me today. Please don't hesitate to contact me if I can be of assistance to you.   Following are the goals we discussed today:   Goals Addressed             This Visit's Progress    Patient needs help with ADLs and with daily funtioning       Interventions:  Spoke with William [name], spouse of client, via phone about status of client Discussed medication procurement of client. Client has his prescribed medications Discussed pain issues of client. Client has some back pain issues . He has some shoulder pain issues Discussed upcoming client medical appointments.  William [name] said client has upcoming medical appointment with ENT clinic William [name] provides transport assistance for client for client to go to and from client medical appointments Discussed program support with RN, LCSW, Pharmacist William [name] discussed care strain issues.  LCSW suggested that William [name] look into annual respite care benefit through Medicare to see if client might qualify for some support annually through respite care benefit with Medicare Discussed client weakness in left arm. Discussed physical therapy sessions of client. Discussed hearing needs of client Reviewed sleeping issues of client. William [name] said client was sleeping adequately Discussed client ambulation. William [name] said client has to have someone help him when client walks  Encouraged William [name] to call LCSW as needed at [phone] to discuss SW needs of client William [name] for call with LCSW today          Our next appointment is by telephone on 09/20/23 at 3:00 PM   Please call the care guide team at [phone] if you need to cancel or reschedule your appointment.   If you are experiencing a Mental Health or Behavioral Health Crisis or need someone to talk to, please go to [facility] [address] [place] [phone])    The Patient / spouse of patient,   verbalized understanding of instructions, educational materials, and care plan provided today and DECLINED offer to receive copy of patient instructions, educational materials, and care plan.   The patient / spouse of patient, has been provided with contact information for the care management team and has been advised to call with any health related questions or concerns.    William [name]  MSW, LCSW [facility] Licensed Clinical Social Worker Direct Dial:  [phone] Fax:  [phone] Website:  [email or web address]

## 2023-07-19 NOTE — Patient Outreach (Signed)
  Care Coordination   Follow Up Visit Note   07/19/2023 Name: William [name] MRN: [id number] DOB: 1941/04/12  William [name] is a 83 y.o. year old male who sees Antonio Meth, Jamee SAUNDERS, DO for primary care. I spoke with  William JINNY [name] / William [name], spouse of client, via phone today.  What matters to the patients health and wellness today? Patient needs help with ADLs and with daily functioning     Goals Addressed             This Visit's Progress    Patient needs help with ADLs and with daily funtioning       Interventions:  Spoke with William [name], spouse of client, via phone about status of client Discussed medication procurement of client. Client has his prescribed medications Discussed pain issues of client. Client has some back pain issues . He has some shoulder pain issues Discussed upcoming client medical appointments.  William said client has upcoming medical appointment with ENT clinic William provides transport assistance for client for client to go to and from client medical appointments Discussed program support with RN, LCSW, Pharmacist William discussed care strain issues.  LCSW suggested that William look into annual respite care benefit through Medicare to see if client might qualify for some support annually through respite care benefit with Medicare Discussed client weakness in left arm. Discussed physical therapy sessions of client. Discussed hearing needs of client Reviewed sleeping issues of client. William said client was sleeping adequately Discussed client ambulation. William said client has to have someone help him when client walks  Encouraged William to call LCSW as needed at [phone] to discuss SW needs of client William [name] for call with LCSW today          SDOH assessments and interventions completed:  Yes  SDOH Interventions Today    Flowsheet Row Most Recent Value  SDOH Interventions   Physical Activity Interventions Other (Comments)   [client is receiving scheduled physical therapy sessions]  Stress Interventions Other (Comment)  [client has stress in managing ADLs,  client has stress in managing medical needs]        Care Coordination Interventions:  Yes, provided    Interventions Today    Flowsheet Row Most Recent Value  Chronic Disease   Chronic disease during today's visit Other  [spoke with spouse of client about client needs]  General Interventions   General Interventions Discussed/Reviewed General Interventions Discussed, Community Resources  Education Interventions   Education Provided Provided Education  Provided Engineer, [employer]  Mental Health Interventions   Mental Health Discussed/Reviewed Coping Strategies  [no mood issues noted]  Nutrition Interventions   Nutrition Discussed/Reviewed Nutrition Discussed  Pharmacy Interventions   Pharmacy Dicussed/Reviewed Pharmacy Topics Discussed  Safety Interventions   Safety Discussed/Reviewed Fall Risk       Follow up plan: Follow up call scheduled for 09/20/23 at 3:00 PM     Encounter Outcome:  Patient Visit Completed    Glendia Pear  MSW, LCSW [facility] Licensed Clinical Social Worker Direct Dial:  [phone] Fax:  [phone] Website:  [email or web address]

## 2023-07-21 ENCOUNTER — Encounter: Payer: Self-pay | Admitting: Physical Therapy

## 2023-07-21 ENCOUNTER — Ambulatory Visit: Payer: PPO | Attending: Family Medicine | Admitting: Physical Therapy

## 2023-07-21 DIAGNOSIS — R2689 Other abnormalities of gait and mobility: Secondary | ICD-10-CM | POA: Diagnosis not present

## 2023-07-21 DIAGNOSIS — R262 Difficulty in walking, not elsewhere classified: Secondary | ICD-10-CM | POA: Diagnosis not present

## 2023-07-21 DIAGNOSIS — M25512 Pain in left shoulder: Secondary | ICD-10-CM | POA: Diagnosis not present

## 2023-07-21 DIAGNOSIS — I69354 Hemiplegia and hemiparesis following cerebral infarction affecting left non-dominant side: Secondary | ICD-10-CM | POA: Diagnosis not present

## 2023-07-21 DIAGNOSIS — M6281 Muscle weakness (generalized): Secondary | ICD-10-CM | POA: Insufficient documentation

## 2023-07-21 DIAGNOSIS — R296 Repeated falls: Secondary | ICD-10-CM | POA: Diagnosis not present

## 2023-07-21 DIAGNOSIS — Z9181 History of falling: Secondary | ICD-10-CM | POA: Insufficient documentation

## 2023-07-21 NOTE — Therapy (Signed)
 OUTPATIENT PHYSICAL THERAPY NEURO TREATMENT  Patient Name: William [name] MRN: [id number] DOB:Nov 27, 1940, 83 y.o., male Today's Date: 07/21/2023  PCP: [name]  DO REFERRING PROVIDER: same   END OF SESSION:  PT End of Session - 07/21/23 1452     Visit Number [id number]    Date for PT Re-Evaluation 09/18/23    Authorization Type HTA    PT Start Time 1444    PT Stop Time 1528    PT Time Calculation (min) 44 min    Equipment Utilized During Treatment Gait belt    Activity Tolerance Patient tolerated treatment well    Behavior During Therapy WFL for tasks assessed/performed               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   HTN (hypertension) 07/19/2015   Hyperlipidemia    Major neurocognitive disorder 01/09/2014   Mild, related to stroke history   Nocturia    Renal insufficiency 06/25/2013   S/P carotid endarterectomy    BILATERAL ICA--  PATENT PER DUPLEX  05-19-2012   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/26/2017   Right calf   Urinary frequency    Vitamin D  deficiency    Past Surgical History:  Procedure Laterality Date   APPENDECTOMY  AS CHILD   CARDIOVASCULAR STRESS TEST  03-27-2012  DR CRENSHAW   LOW RISK LEXISCAN  STUDY-- PROBABLE NORMAL PERFUSION AND SOFT TISSUE ATTENUATION/  NO ISCHEMIA/ EF 51%   CAROTID ENDARTERECTOMY Bilateral LEFT  11-12-2008  DR GREG HAYES   RIGHT ICA  2006  ([facility])   CHOLECYSTECTOMY N/A 02/23/2022   Procedure: LAPAROSCOPIC CHOLECYSTECTOMY;  Surgeon: [name], MD;  Location: [facility] OR;  Service: General;   Laterality: N/A;   CYSTOSCOPY W/ RETROGRADES Bilateral 06/22/2021   Procedure: CYSTOSCOPY WITH RETROGRADE PYELOGRAM;  Surgeon: [name], MD;  Location: [facility];  Service: Urology;  Laterality: Bilateral;   CYSTOSCOPY WITH LITHOLAPAXY N/A 02/26/2013   Procedure: CYSTOSCOPY WITH LITHOLAPAXY;  Surgeon: Senior Matilda, MD;  Location: [facility];  Service: Urology;  Laterality: N/A;   ENDOSCOPIC RETROGRADE CHOLANGIOPANCREATOGRAPHY (ERCP) WITH PROPOFOL  N/A 02/22/2022   Procedure: ENDOSCOPIC RETROGRADE CHOLANGIOPANCREATOGRAPHY (ERCP) WITH PROPOFOL ;  Surgeon: [name], MD;  [facility] ENDOSCOPY;  Service: Gastroenterology;  Laterality: N/A;   EYE SURGERY  Jan. 2016   cataract surgery both eyes   INGUINAL HERNIA REPAIR Right 11-08-2006   IR KYPHO EA ADDL LEVEL THORACIC OR LUMBAR  02/12/2021   IR RADIOLOGIST EVAL & MGMT  02/18/2021   MASS EXCISION N/A 03/03/2016   Procedure: EXCISION OF BACK  MASS;  Surgeon: [name], MD;  Location: [facility];  Service: General;  Laterality: N/A;   MOHS SURGERY Left 1/ 2016   [name]-- Basal cell   PROSTATE SURGERY     REMOVAL OF STONES  02/22/2022   Procedure: REMOVAL OF STONES;  Surgeon: [name], MD;  Location: [facility] ENDOSCOPY;  Service: Gastroenterology;;   [name]  02/22/2022  Procedure: SPHINCTEROTOMY;  Surgeon: [name], MD;  Location[facility] ENDOSCOPY;  Service: Gastroenterology;;   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 N/A 06/22/2021   Procedure: TRANSURETHRAL RESECTION OF BLADDER TUMOR;  Surgeon: [name], MD;  Location: [facility];  Service: Urology;  Laterality: N/A;   TRANSURETHRAL RESECTION OF PROSTATE N/A 02/26/2013   Procedure: TRANSURETHRAL RESECTION OF THE PROSTATE WITH GYRUS INSTRUMENTS;  Surgeon: Senior Matilda, MD;  Location: [facility];  Service: Urology;  Laterality: N/A;   TRANSURETHRAL RESECTION OF PROSTATE  N/A 06/22/2021   Procedure: TRANSURETHRAL RESECTION OF THE PROSTATE (TURP);  Surgeon: [name], MD;  Location: [facility];  Service: Urology;  Laterality: N/A;   Patient Active Problem List   Diagnosis Date Noted   Bronchitis 06/16/2023   Impacted cerumen of left ear 06/16/2023   Acute encephalopathy 04/18/2023   Parkinson's disease (HCC) 04/18/2023   History of CVA with residual deficit 04/18/2023   Chronic kidney disease, stage 3b (HCC) 04/18/2023   Dementia with behavioral disturbance (HCC) 04/18/2023   Anemia of chronic disease 04/18/2023   Laceration of right upper extremity 02/24/2023   Dermatitis 02/24/2023   Lung nodules 02/24/2023   Dysuria 08/03/2022   Nonintractable headache 07/01/2022   Bilateral impacted cerumen 06/24/2022   Rash 06/15/2022   Postoperative ileus (HCC) 02/27/2022   Ileus, postoperative (HCC) 02/26/2022   Choledocholithiasis 02/19/2022   DNR (do not resuscitate) 02/19/2022   Left elbow pain 01/26/2022   Mid back pain on left side 01/26/2022   Rib pain 01/26/2022   Acute pain of left shoulder 11/12/2021   Leukocytes in urine 11/12/2021   Urinary frequency 11/12/2021   Thrush 10/08/2021   Hemiplegia, 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   Insomnia    Prediabetes    Acute 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 (HCC)    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   Transaminitis 07/27/2021   UTI (urinary tract infection) 07/27/2021   CVA (cerebral vascular accident) (HCC) 07/27/2021   Fall 07/27/2021   Hyperglycemia 07/27/2021   Cholelithiasis 07/27/2021   Hypoxia 07/27/2021   Nausea and vomiting 07/27/2021   Acute metabolic encephalopathy 07/27/2021   Normocytic anemia 07/27/2021   Chronic back pain 07/27/2021   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   Closed fracture of first lumbar vertebra with routine healing 02/03/2021   Closed fracture  of multiple ribs 11/18/2020   Anxiety 01/29/2020   Leg pain, bilateral 01/29/2020   Ingrown toenail 07/13/2019   Lumbar spondylosis 05/02/2018   Pain in left knee 03/09/2018   Osteoarthritis of left hip 01/16/2018   Trochanteric bursitis of left hip 01/16/2018   Preventative health care 09/26/2017   HTN (hypertension) 07/19/2015   Hyperlipidemia 07/19/2015   Great toe pain 02/11/2014   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   Obesity (BMI 30-39.9) 06/25/2013   Renal insufficiency 06/25/2013   Weakness of left arm 06/25/2013   Sebaceous cyst 03/03/2011   Sprain of lumbar region 07/31/2010   Rib pain, left 08/29/2009   Carotid artery stenosis, asymptomatic, bilateral 05/02/2009   Eczema, atopic 05/31/2008   Vitamin D  deficiency 03/01/2008   BPH (benign prostatic hyperplasia) 08/06/2007   Fasting hyperglycemia 12/21/2006   History of right MCA infarct 2006    ONSET DATE: 06/29/22 REFERRING DIAG:  Diagnosis  I63.9 (ICD-10-CM) - Cerebrovascular accident (CVA), unspecified mechanism (HCC)  W19.XXXD (ICD-10-CM) - Fall, subsequent encounter    THERAPY DIAG:  Muscle weakness (  generalized)  Other abnormalities of gait and mobility  Hemiplegia and hemiparesis following cerebral infarction affecting left non-dominant side (HCC)  History of falling  Difficulty in walking, not elsewhere classified  Repeated falls  Acute pain of left shoulder  Rationale for Evaluation and Treatment: Rehabilitation  SUBJECTIVE:                                                                                                                                                                                         SUBJECTIVE STATEMENT:  Wife reports that she did not get him his medication in time today so he is not doing as well with walking or talking. PERTINENT HISTORY:  William [name] is a 83 year old man with dementia, CKD, HTN, CAD, HLD and history of CVA  (2006, 2023),  Parkinson's  PAIN:  Are you having pain? Faces/behavior scale- mild pain BLEs   PRECAUTIONS: Fall  WEIGHT BEARING RESTRICTIONS: No  FALLS: Has patient fallen in last 6 months? No  LIVING ENVIRONMENT: Lives with: lives with their family and lives with their spouse Lives in: House/apartment Stairs:  1 brick high step Has following equipment at home: Environmental Consultant - 2 wheeled, Wheelchair (manual), Shower bench, bed side commode, Grab bars, and hospital bed, sliding pad for car  PLOF: Independent with basic ADLs and Independent with household mobility without device  PATIENT GOALS: Walk around the block with his wife. Be able to use the dining room chair rather than have to use the W/C. Increased I with bed mobility and simple tasks at home like make some coffee or brush his teeth, Step over tub to shower, has bench, but he can't really use it. Up and down the one step to get to back deck. 11/23/22: Patient's wife William [name] updated his goals today: Walk around store (gets distracted), swing a golf club again (thinking of driving range), get up on his own, walk without leaning forward, more recognition of the left side (from CVA), not need gait belt, OT-- be able to get dressed more on his own, navigate the newspaper.  OBJECTIVE:  TREATMENT: 07/21/23 Gait outside with HHA on the right negotiating curbs, cues for step length and posture, around to the front door,  Nustep level 5 x 7 minutes 2.5# LAQ 2.5# William [name] with wife regarding renewal and what is going on with William [name], she reports that in the morning he freezes when trying to go to the bathroom, and cannot sit, she also reports that she is using Max A to get him from supine to sit.  We practiced this with William [name] in the bathroom and today he had no big issues  of freezing. I started some education with wife on some physics of levers and fulcrums of trying to get him out of bed from supine Pball press for core work Retested Tug and  5XSTS  07/14/23 Nustep level 5 x 7 minutes UBE level 2 x 4 minutes Passive stretch hand, fingers, wrist, elbow and shoulder left Standing step turn reach touch Standing reaching for numbers on the wall with left hand and right Sit to stand with cues for both arms to push up. Gait outside with HHA 200 feet then rest and then 200 feet through some grass and uneven terrain to the car  07/07/23 Nustep L5 x 8 min for neural priming Sit<>stand with hands on fitter poles x15 Sitting and forward reach for lumbar mobility x10 Sitting hands on the side (L hand on ball to keep palm as open as possible due to inability to fully extend fingers and wrist to keep his hand flat on mat table) with scap squeeze/chest up to ceiling for trunk mobility x10 Sitting and side reach and then rotation for trunk mobility and UE ROM 2x10 Standing big step forward with hands on fitter poles 2x10 Standing big side step L&R with hands on fitter poles 2x10 Supine side step 2x10 L&R Supine butterfly stretch within tolerable range Supine LTR x10 Supine UE reach towards ceilings for trunk and UE ROM x10 Supine side reach towards walls for trunk and UE ROM x10 pt notes this hurt more Gentle PROM of L elbow and shoulder in supine Amb x3 laps with CGA, holding on pt's L hand to keep him from guarding/holding L UE in spastic flexion  06/28/23 Nustep level 4 x 7 minutes PT passive stretch of the left fingers, left wrist, left elbow and left shoulder Some AAROM of the left elbow and shoulder Some STM to the left upper trap and left forearm Heat on the back while doing the above UBE level 3 x 6 minutes Seated ball toss Standing ball toss and ball kicks  06/21/23 Seated PWR! Sit<>stand x10 Standing stepping on to numbers on floor R&L Side lunge on to numbers and then performed with cognitive tasks (I.e. adding/subtracting) High knees up into pillow Side kick into pillow Punching into pillow Box stepping on numbers with  HHA CW & CCW Amb x 4 laps around gym with gait belt, CGA for safety (too much forward lean)   06/14/23 Nustep level 5 x 7 minutes Bike level 4 x 6 minutes Gait with HHA and belt outside asphalt 200 feet rest then 300 feet Standing balance touching numbers on wall did miss some on the left side needed some cues, to look to the left and to not hold on, did right arm reaching and left arm reaching [employer] Side stepping, backward walking Gait outside in the grass x 200 feet  PATIENT EDUCATION: Education details: none new 02/16/23 Person educated: Patient and Spouse Education method: Explanation, Demonstration, Tactile cues, and Verbal cues Education comprehension: verbalized understanding, returned demonstration, verbal cues required, tactile cues required, and needs further education   ------------------------------------------------------------------------------------------------------- (Measures in this section from initial evaluation unless otherwise noted) DIAGNOSTIC FINDINGS:  MRI 2021 with degenerative changes in lumbar spine, L3-4 R subarticular stenosis COGNITION: Overall cognitive status: History of cognitive impairments - at baseline SENSATION: Not tested COORDINATION: Moderately impaired BLE MUSCLE LENGTH: Hamstrings: severely restricted B Thomas test: Severely restricted B. POSTURE: rounded shoulders, forward head, increased thoracic kyphosis, posterior pelvic tilt, and flexed trunk  LOWER EXTREMITY ROM:  BLE extremely stiff throughout, limited hip ROM in all planes, B knees limited in extension. LOWER EXTREMITY MMT:  3+/5 throughout. Unable to determine accuracy of MMT due to cognition. Functionally demosntrates poor coordinated activation and poor muscular endurance. BED MOBILITY: Min A for Supine<> Sit. Patient was using a ladder in his hospital bed and could move MI. TRANSFERS: Min A, mod VC, tends to lean back.  11/02/22 independent with use of hands on  arm chair with a couple of tries CURB: Min A-Has one step out to his deck. STAIRS: N/A  GAIT: Gait pattern: step to pattern, decreased arm swing- Right, decreased arm swing- Left, decreased step length- Right, decreased step length- Left, shuffling, festinating, trunk flexed, narrow BOS, poor foot clearance- Right, and poor foot clearance- Left Distance walked: 5' Assistive device utilized: Environmental Consultant - 2 wheeled Level of assistance: Occasional min A Comments: mod VC to stay close to walker, take longer steps. FUNCTIONAL TESTS:  5 times sit to stand: 38.23  09/29/22  could not performs 5XSTS, 11/02/22 = 47 seconds with cues to use arms, 07/21/23 really struggled today 55 seconds Timed up and go (TUG): 47.91  with RW, 07/21/22 TUG 29  seconds without device, 09/29/22 25 seconds, 11/02/22 TUG 20 seconds, 07/21/23 = 26 seconds no device SBA                                                                     PATIENT EDUCATION: Education details: Progress towards goals, POC Person educated: Patient and Spouse Education method: Programmer, Multimedia, Facilities Manager, and Verbal cues  Education comprehension: verbalized understanding, returned demonstration, verbal cues required, and needs further education  HOME EXERCISE PROGRAM: PWR! moves  GOALS: Goals reviewed with patient? Yes  SHORT TERM GOALS: Target date: 12/22/22  Perform HEP for PWR! Moves with cues from family. Baseline: Pt's wife, William [name], notes walking and hitting beach ball as main HEP. Goal status: GOAL MET, 12/20/2022   2. Decrease 5 x STS to < 35 sec to demonstrate improved functional strength  Baseline: 47 seconds on 5/21 per note; 34.5 sec UE support 12/20/2022  Goal status: GOAL MET, 12/20/2022   3. Perform sit <> stand transfers from average chair height with Supervision.  Baseline: CGA  12/20/2022; (01/10/23) CGA  Goal status: NOT MET  LONG TERM GOALS: Target date: 04/22/23  Patient will perform HEP progression with caregiver assist. Baseline:  has minimal HEP Goal status: ONGOING  2.  Decrease 5 x STS to < 25 sec to demonstrate improved functional strength Baseline: 47 seconds on 5/21 per note; (01/10/23) 36 sec w/ cues for speed/sequence>30.38 sec with BUE supprot Goal status: PARTIALLY MET, 01/24/2023  3. Improve quality of gait to be able to ambulate x 100 feet with supervision. Baseline: CGA with cues  Goal status: REVISED  4.  Patient will perform his bed mobility with MI including rolling, sit<>supine, and scooting in bed. Baseline: on hard surface mat he is independent but on soft bed at home he struggles, wife reports only needs a hand to get up now 11/02/22; (01/10/23) modified indep.  Goal status: MET  5.  Patient will demonstrate floor<>stand with CGA and verbal cues for patient's wife to be able to assist. Baseline:  Has to call 9-1-1; +2 max assist  floor>stand with UE support 01/19/2023 Goal status: GOAL NOT MET 01/19/2023  **UPDATED GOALS BELOW ** per recert 05/17/2023  SHORT TERM GOALS: Target date: 02/25/2023   Pt will perform HEP to maintain lower body strength, flexibility, posture with wife's assistance. Baseline:  Goal status: met 04/12/23  2. Pt will perform sit to stand, 8 of 10 reps with BUE support, with supervision, to maintain safety with transfers.  Baseline: min guard  Goal status: 02/07/23-CGA from elevated surface and max VC to lean forward, ongoing   LONG TERM GOALS: Target date: 07/18/23   Patient will perform HEP progression to maintain lower body strength, flexibility, posture with wife's assistance. Baseline:  Goal status: ongoing but progressing 07/21/23  2.  Pt will ambulate 150-175 ft in 2 MWT with min guard assist, to preserve functional mobility within the home.  Baseline: Goal status:  progressing , needs CGA  3. Wife will verbalize tips for fall prevention education to minimize fall frequency Baseline:  Goal status:progressing and doing well 4.  Patient will be able to toilet without  freezing for over a 4 week period 5.  Patient will be able to get out of bed from supine with wife assist minimal Assist   ASSESSMENT:  CLINICAL IMPRESSION:  Pt continues to guard L UE ,Wife reports that she did not give him his medicine in time today so he is much slower and hs more distraction.  I did renewal today to continue the maintenance therapy as he has a progressive disease.   Without the medication he did have increased shuffling , more easily distracted, and more slurred speech.  He however did okay with the walking, just needed more CGA an redirection.  We did talk and practice with wife about toilet transfers and bed transfers as she reports that he is having much more difficulty.  OBJECTIVE IMPAIRMENTS: Abnormal gait, decreased activity tolerance, decreased balance, decreased cognition, decreased coordination, decreased endurance, decreased mobility, difficulty walking, decreased ROM, decreased strength, decreased safety awareness, impaired flexibility, impaired UE functional use, improper body mechanics, and postural dysfunction.   PLAN:  PT FREQUENCY: 1x/week  PT DURATION: 12 weeks  PLANNED INTERVENTIONS: Therapeutic exercises, Therapeutic activity, Neuromuscular re-education, Balance training, Gait training, Patient/Family education, Self Care, Joint mobilization, Stair training, Moist heat, and Manual therapy  PLAN FOR NEXT SESSION:   Will continue to try to help him and his wife stay independent and functioning at home,   Patient Details  Name: William [name] MRN: [id number] Date of Birth: [birth year] Referring Provider:  [name], *  Encounter Date: 07/21/2023   William [name], PT 07/21/2023, 2:56 PM  [facility] [address] [facility] [address] [facility] [place], [place], [address] Phone: [phone]   Fax:  [phone]

## 2023-07-25 ENCOUNTER — Other Ambulatory Visit (HOSPITAL_BASED_OUTPATIENT_CLINIC_OR_DEPARTMENT_OTHER): Payer: Self-pay

## 2023-07-25 ENCOUNTER — Other Ambulatory Visit (HOSPITAL_COMMUNITY): Payer: Self-pay | Admitting: Psychiatry

## 2023-07-26 ENCOUNTER — Encounter: Payer: Self-pay | Admitting: Physical Therapy

## 2023-07-26 ENCOUNTER — Other Ambulatory Visit: Payer: Self-pay

## 2023-07-26 ENCOUNTER — Ambulatory Visit: Payer: PPO | Admitting: Physical Therapy

## 2023-07-26 DIAGNOSIS — M6281 Muscle weakness (generalized): Secondary | ICD-10-CM

## 2023-07-26 DIAGNOSIS — R262 Difficulty in walking, not elsewhere classified: Secondary | ICD-10-CM

## 2023-07-26 DIAGNOSIS — Z9181 History of falling: Secondary | ICD-10-CM

## 2023-07-26 DIAGNOSIS — I69354 Hemiplegia and hemiparesis following cerebral infarction affecting left non-dominant side: Secondary | ICD-10-CM

## 2023-07-26 DIAGNOSIS — R296 Repeated falls: Secondary | ICD-10-CM

## 2023-07-26 DIAGNOSIS — R2689 Other abnormalities of gait and mobility: Secondary | ICD-10-CM

## 2023-07-26 DIAGNOSIS — M25512 Pain in left shoulder: Secondary | ICD-10-CM

## 2023-07-26 NOTE — Therapy (Signed)
OUTPATIENT PHYSICAL THERAPY NEURO TREATMENT  Patient Name: William [name] MRN: [id number] DOB:[birth year], 83 y.o., male Today's Date: 07/26/2023  PCP: Donato Schultz  DO REFERRING PROVIDER: same   END OF SESSION:  PT End of Session - 07/26/23 1450     Visit Number [id number]    Date for PT Re-Evaluation 09/18/23    Authorization Type HTA    PT Start Time 1444    PT Stop Time 1529    PT Time Calculation (min) 45 min    Equipment Utilized During Treatment Gait belt    Activity Tolerance Patient tolerated treatment well    Behavior During Therapy WFL for tasks assessed/performed               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   HTN (hypertension) 07/19/2015   Hyperlipidemia    Major neurocognitive disorder 01/09/2014   Mild, related to stroke history   Nocturia    Renal insufficiency 06/25/2013   S/P carotid endarterectomy    BILATERAL ICA--  PATENT PER DUPLEX  05-19-2012   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/26/2017   Right calf   Urinary frequency    Vitamin D deficiency    Past Surgical History:  Procedure Laterality Date   APPENDECTOMY  AS CHILD   CARDIOVASCULAR STRESS TEST  03-27-2012  DR CRENSHAW   LOW RISK LEXISCAN STUDY-- PROBABLE NORMAL PERFUSION AND SOFT TISSUE ATTENUATION/  NO ISCHEMIA/ EF 51%   CAROTID ENDARTERECTOMY Bilateral LEFT  11-12-2008  DR GREG HAYES   RIGHT ICA  2006  ([facility])   CHOLECYSTECTOMY N/A 02/23/2022   Procedure: LAPAROSCOPIC CHOLECYSTECTOMY;  Surgeon: Quentin Ore, MD;  Location: [facility];  Service:  General;  Laterality: N/A;   CYSTOSCOPY W/ RETROGRADES Bilateral 06/22/2021   Procedure: CYSTOSCOPY WITH RETROGRADE PYELOGRAM;  Surgeon: Marcine Matar, MD;  Location: [facility] [place];  Service: Urology;  Laterality: Bilateral;   CYSTOSCOPY WITH LITHOLAPAXY N/A 02/26/2013   Procedure: CYSTOSCOPY WITH LITHOLAPAXY;  Surgeon: Marcine Matar, MD;  Location: [facility];  Service: Urology;  Laterality: N/A;   ENDOSCOPIC RETROGRADE CHOLANGIOPANCREATOGRAPHY (ERCP) WITH PROPOFOL N/A 02/22/2022   Procedure: ENDOSCOPIC RETROGRADE CHOLANGIOPANCREATOGRAPHY (ERCP) WITH PROPOFOL;  Surgeon: Jeani Hawking, MD;  Location: [facility] ENDOSCOPY;  Service: Gastroenterology;  Laterality: N/A;   EYE SURGERY  Jan. 2016   cataract surgery both eyes   INGUINAL HERNIA REPAIR Right 11-08-2006   IR KYPHO EA ADDL LEVEL THORACIC OR LUMBAR  02/12/2021   IR RADIOLOGIST EVAL & MGMT  02/18/2021   MASS EXCISION N/A 03/03/2016   Procedure: EXCISION OF BACK  MASS;  Surgeon: Almond Lint, MD;  [facility];  Service: General;  Laterality: N/A;   MOHS SURGERY Left 1/ 2016   Dr Margo Aye-- Basal cell   PROSTATE SURGERY     REMOVAL OF STONES  02/22/2022   Procedure: REMOVAL OF STONES;  Surgeon: Jeani Hawking, MD;  Location: [facility] ENDOSCOPY;  Service: Gastroenterology;;   Dennison Mascot  02/22/2022  Procedure: SPHINCTEROTOMY;  Surgeon: Jeani Hawking, MD;  Location: [facility] END[facility]COPY;  Service: Gastroenterology;;   TRANSURETHRAL RESECTION OF BLADDER TUMOR WITH MITOMYCIN-C N/A 06/22/2021   Procedure: TRANSURETHRAL RESECTION OF BLADDER TUMOR;  Surgeon: Marcine Matar, MD;  Location: [facility];  Service: Urology;  Laterality: N/A;   TRANSURETHRAL RESECTION OF PROSTATE N/A 02/26/2013   Procedure: TRANSURETHRAL RESECTION OF THE PROSTATE WITH GYRUS INSTRUMENTS;  Surgeon: Marcine Matar, MD;  Location: [facility];  Service: Urology;  Laterality: N/A;   TRANSURETHRAL RESECTION OF  PROSTATE N/A 06/22/2021   Procedure: TRANSURETHRAL RESECTION OF THE PROSTATE (TURP);  Surgeon: Marcine Matar, MD;  Location: [facility];  Service: Urology;  Laterality: N/A;   Patient Active Problem List   Diagnosis Date Noted   Bronchitis 06/16/2023   Impacted cerumen of left ear 06/16/2023   Acute encephalopathy 04/18/2023   Parkinson's disease (HCC) 04/18/2023   History of CVA with residual deficit 04/18/2023   Chronic kidney disease, stage 3b (HCC) 04/18/2023   Dementia with behavioral disturbance (HCC) 04/18/2023   Anemia of chronic disease 04/18/2023   Laceration of right upper extremity 02/24/2023   Dermatitis 02/24/2023   Lung nodules 02/24/2023   Dysuria 08/03/2022   Nonintractable headache 07/01/2022   Bilateral impacted cerumen 06/24/2022   Rash 06/15/2022   Postoperative ileus (HCC) 02/27/2022   Ileus, postoperative (HCC) 02/26/2022   Choledocholithiasis 02/19/2022   DNR (do not resuscitate) 02/19/2022   Left elbow pain 01/26/2022   Mid back pain on left side 01/26/2022   Rib pain 01/26/2022   Acute pain of left shoulder 11/12/2021   Leukocytes in urine 11/12/2021   Urinary frequency 11/12/2021   Thrush 10/08/2021   Hemiplegia, 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   Insomnia    Prediabetes    Acute 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 (HCC)    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   Transaminitis 07/27/2021   UTI (urinary tract infection) 07/27/2021   CVA (cerebral vascular accident) (HCC) 07/27/2021   Fall 07/27/2021   Hyperglycemia 07/27/2021   Cholelithiasis 07/27/2021   Hypoxia 07/27/2021   Nausea and vomiting 07/27/2021   Acute metabolic encephalopathy 07/27/2021   Normocytic anemia 07/27/2021   Chronic back pain 07/27/2021   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   Closed fracture of first lumbar vertebra with routine healing 02/03/2021   Closed  fracture of multiple ribs 11/18/2020   Anxiety 01/29/2020   Leg pain, bilateral 01/29/2020   Ingrown toenail 07/13/2019   Lumbar spondylosis 05/02/2018   Pain in left knee 03/09/2018   Osteoarthritis of left hip 01/16/2018   Trochanteric bursitis of left hip 01/16/2018   Preventative health care 09/26/2017   HTN (hypertension) 07/19/2015   Hyperlipidemia 07/19/2015   Great toe pain 02/11/2014   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   Obesity (BMI 30-39.9) 06/25/2013   Renal insufficiency 06/25/2013   Weakness of left arm 06/25/2013   Sebaceous cyst 03/03/2011   Sprain of lumbar region 07/31/2010   Rib pain, left 08/29/2009   Carotid artery stenosis, asymptomatic, bilateral 05/02/2009   Eczema, atopic 05/31/2008   Vitamin D deficiency 03/01/2008   BPH (benign prostatic hyperplasia) 08/06/2007   Fasting hyperglycemia 12/21/2006   History of right MCA infarct 2006    ONSET DATE: 06/29/22 REFERRING DIAG:  Diagnosis  I63.9 (ICD-10-CM) - Cerebrovascular accident (CVA), unspecified mechanism (HCC)  W19.XXXD (ICD-10-CM) - Fall, subsequent encounter    THERAPY DIAG:  Muscle weakness (  generalized)  Other abnormalities of gait and mobility  Hemiplegia and hemiparesis following cerebral infarction affecting left non-dominant side (HCC)  History of falling  Difficulty in walking, not elsewhere classified  Repeated falls  Acute pain of left shoulder  Rationale for Evaluation and Treatment: Rehabilitation  SUBJECTIVE:                                                                                                                                                                                         SUBJECTIVE STATEMENT:  Had a birthday yesterday, reports exhausted after yesterday. PERTINENT HISTORY:  OJAS COONE is a 83 year old man with dementia, CKD, HTN, CAD, HLD and history of CVA  (2006, 2023), Parkinson's  PAIN:  Are you having pain? Faces/behavior  scale- mild pain BLEs   PRECAUTIONS: Fall  WEIGHT BEARING RESTRICTIONS: No  FALLS: Has patient fallen in last 6 months? No  LIVING ENVIRONMENT: Lives with: lives with their family and lives with their spouse Lives in: House/apartment Stairs:  1 brick high step Has following equipment at home: Environmental consultant - 2 wheeled, Wheelchair (manual), Shower bench, bed side commode, Grab bars, and hospital bed, sliding pad for car  PLOF: Independent with basic ADLs and Independent with household mobility without device  PATIENT GOALS: Walk around the block with his wife. Be able to use the dining room chair rather than have to use the W/C. Increased I with bed mobility and simple tasks at home like make some coffee or brush his teeth, Step over tub to shower, has bench, but he can't really use it. Up and down the one step to get to back deck. 11/23/22: Patient's wife [name] updated his goals today: Walk around store (gets distracted), swing a golf club again (thinking of driving range), get up on his own, walk without leaning forward, more recognition of the left side (from CVA), not need gait belt, OT-- be able to get dressed more on his own, navigate the newspaper.  OBJECTIVE:  TREATMENT: 07/26/23 Getting in and out of car with him due to rain, he struggled due to a lot going on and it seemed to fluster him some Nustep level 5 x 7 minutes UBE level 3 x 6 minutes Supine bridges Partial sit ups Standing ball toss and volleyball some cues for left hand Gait with HHA x 3 laps Practice with him and wife getting in and out of bed, x3  07/21/23 Gait outside with HHA on the right negotiating curbs, cues for step length and posture, around to the front door,  Nustep level 5 x 7 minutes 2.5# LAQ 2.5# [employer] with wife regarding renewal  and what is going on with Nadine Counts, she reports that in the morning he freezes when trying to go to the bathroom, and cannot sit, she also reports that she is using Max A to get  him from supine to sit.  We practiced this with Nadine Counts in the bathroom and today he had no big issues of freezing. I started some education with wife on some physics of levers and fulcrums of trying to get him out of bed from supine Pball press for core work Retested [place] and 5XSTS  07/14/23 Nustep level 5 x 7 minutes UBE level 2 x 4 minutes Passive stretch hand, fingers, wrist, elbow and shoulder left Standing step turn reach touch Standing reaching for numbers on the wall with left hand and right Sit to stand with cues for both arms to push up. Gait outside with HHA 200 feet then rest and then 200 feet through some grass and uneven terrain to the car  07/07/23 Nustep L5 x 8 min for neural priming Sit<>stand with hands on fitter poles x15 Sitting and forward reach for lumbar mobility x10 Sitting hands on the side (L hand on ball to keep palm as open as possible due to inability to fully extend fingers and wrist to keep his hand flat on mat table) with scap squeeze/chest up to ceiling for trunk mobility x10 Sitting and side reach and then rotation for trunk mobility and UE ROM 2x10 Standing big step forward with hands on fitter poles 2x10 Standing big side step L&R with hands on fitter poles 2x10 Supine side step 2x10 L&R Supine butterfly stretch within tolerable range Supine LTR x10 Supine UE reach towards ceilings for trunk and UE ROM x10 Supine side reach towards walls for trunk and UE ROM x10 pt notes this hurt more Gentle PROM of L elbow and shoulder in supine Amb x3 laps with CGA, holding on pt's L hand to keep him from guarding/holding L UE in spastic flexion  06/28/23 Nustep level 4 x 7 minutes PT passive stretch of the left fingers, left wrist, left elbow and left shoulder Some AAROM of the left elbow and shoulder Some STM to the left upper trap and left forearm Heat on the back while doing the above UBE level 3 x 6 minutes Seated ball toss Standing ball toss and ball  kicks  06/21/23 Seated PWR! Sit<>stand x10 Standing stepping on to numbers on floor R&L Side lunge on to numbers and then performed with cognitive tasks (I.e. adding/subtracting) High knees up into pillow Side kick into pillow Punching into pillow Box stepping on numbers with HHA CW & CCW Amb x 4 laps around gym with gait belt, CGA for safety (too much forward lean)   06/14/23 Nustep level 5 x 7 minutes Bike level 4 x 6 minutes Gait with HHA and belt outside asphalt 200 feet rest then 300 feet Standing balance touching numbers on wall did miss some on the left side needed some cues, to look to the left and to not hold on, did right arm reaching and left arm reaching [employer] Side stepping, backward walking Gait outside in the grass x 200 feet  PATIENT EDUCATION: Education details: none new 02/16/23 Person educated: Patient and Spouse Education method: Explanation, Demonstration, Tactile cues, and Verbal cues Education comprehension: verbalized understanding, returned demonstration, verbal cues required, tactile cues required, and needs further education   ------------------------------------------------------------------------------------------------------- (Measures in this section from initial evaluation unless otherwise noted) DIAGNOSTIC FINDINGS:  MRI 2021 with degenerative  changes in lumbar spine, L3-4 R subarticular stenosis COGNITION: Overall cognitive status: History of cognitive impairments - at baseline SENSATION: Not tested COORDINATION: Moderately impaired BLE MUSCLE LENGTH: Hamstrings: severely restricted B Thomas test: Severely restricted B. POSTURE: rounded shoulders, forward head, increased thoracic kyphosis, posterior pelvic tilt, and flexed trunk  LOWER EXTREMITY ROM:   BLE extremely stiff throughout, limited hip ROM in all planes, B knees limited in extension. LOWER EXTREMITY MMT:  3+/5 throughout. Unable to determine accuracy of MMT due to  cognition. Functionally demosntrates poor coordinated activation and poor muscular endurance. BED MOBILITY: Min A for Supine<> Sit. Patient was using a ladder in his hospital bed and could move MI. TRANSFERS: Min A, mod VC, tends to lean back.  11/02/22 independent with use of hands on arm chair with a couple of tries CURB: Min A-Has one step out to his deck. STAIRS: N/A  GAIT: Gait pattern: step to pattern, decreased arm swing- Right, decreased arm swing- Left, decreased step length- Right, decreased step length- Left, shuffling, festinating, trunk flexed, narrow BOS, poor foot clearance- Right, and poor foot clearance- Left Distance walked: 35' Assistive device utilized: Environmental consultant - 2 wheeled Level of assistance: Occasional min A Comments: mod VC to stay close to walker, take longer steps. FUNCTIONAL TESTS:  5 times sit to stand: 38.23  09/29/22  could not performs 5XSTS, 11/02/22 = 47 seconds with cues to use arms, 07/21/23 really struggled today 55 seconds Timed up and go (TUG): 47.91  with RW, 07/21/22 TUG 29  seconds without device, 09/29/22 25 seconds, 11/02/22 TUG 20 seconds, 07/21/23 = 26 seconds no device SBA                                                                     PATIENT EDUCATION: Education details: Progress towards goals, POC Person educated: Patient and Spouse Education method: Programmer, multimedia, Facilities manager, and Verbal cues  Education comprehension: verbalized understanding, returned demonstration, verbal cues required, and needs further education  HOME EXERCISE PROGRAM: PWR! moves  GOALS: Goals reviewed with patient? Yes  SHORT TERM GOALS: Target date: 12/22/22  Perform HEP for PWR! Moves with cues from family. Baseline: Pt's wife, [name], notes walking and hitting beach ball as main HEP. Goal status: GOAL MET, 12/20/2022   2. Decrease 5 x STS to < 35 sec to demonstrate improved functional strength  Baseline: 47 seconds on 5/21 per note; 34.5 sec UE support 12/20/2022  Goal  status: GOAL MET, 12/20/2022   3. Perform sit <> stand transfers from average chair height with Supervision.  Baseline: CGA  12/20/2022; (01/10/23) CGA  Goal status: NOT MET  LONG TERM GOALS: Target date: 04/22/23  Patient will perform HEP progression with caregiver assist. Baseline: has minimal HEP Goal status: ONGOING  2.  Decrease 5 x STS to < 25 sec to demonstrate improved functional strength Baseline: 47 seconds on 5/21 per note; (01/10/23) 36 sec w/ cues for speed/sequence>30.38 sec with BUE supprot Goal status: PARTIALLY MET, 01/24/2023  3. Improve quality of gait to be able to ambulate x 100 feet with supervision. Baseline: CGA with cues  Goal status: REVISED  4.  Patient will perform his bed mobility with MI including rolling, sit<>supine, and scooting in bed. Baseline: on hard surface mat he  is independent but on soft bed at home he struggles, wife reports only needs a hand to get up now 11/02/22; (01/10/23) modified indep.  Goal status: MET  5.  Patient will demonstrate floor<>stand with CGA and verbal cues for patient's wife to be able to assist. Baseline:  Has to call 9-1-1; +2 max assist floor>stand with UE support 01/19/2023 Goal status: GOAL NOT MET 01/19/2023  **UPDATED GOALS BELOW ** per recert 05/17/2023  SHORT TERM GOALS: Target date: 02/25/2023   Pt will perform HEP to maintain lower body strength, flexibility, posture with wife's assistance. Baseline:  Goal status: met 04/12/23  2. Pt will perform sit to stand, 8 of 10 reps with BUE support, with supervision, to maintain safety with transfers.  Baseline: min guard  Goal status: 02/07/23-CGA from elevated surface and max VC to lean forward, ongoing   LONG TERM GOALS: Target date: 07/18/23   Patient will perform HEP progression to maintain lower body strength, flexibility, posture with wife's assistance. Baseline:  Goal status: ongoing but progressing 07/21/23  2.  Pt will ambulate 150-175 ft in 2 MWT with min guard  assist, to preserve functional mobility within the home.  Baseline: Goal status:  progressing , needs CGA  3. Wife will verbalize tips for fall prevention education to minimize fall frequency Baseline:  Goal status:progressing and doing well 4.  Patient will be able to toilet without freezing for over a 4 week period 5.  Patient will be able to get out of bed from supine with wife assist minimal Assist   ASSESSMENT:  CLINICAL IMPRESSION:  WE practiced in and out of bed on our mat tables, he really did well for me and for his wife, I think it could be how hard vs how soft his bed is may be the reason for the struggles at home.  WE may look at the future and see if there is a day and time that we could do a home visit and assure her safety as well as his.   We did talk and practice with wife about toilet transfers and bed transfers as she reports that he is having much more difficulty.  OBJECTIVE IMPAIRMENTS: Abnormal gait, decreased activity tolerance, decreased balance, decreased cognition, decreased coordination, decreased endurance, decreased mobility, difficulty walking, decreased ROM, decreased strength, decreased safety awareness, impaired flexibility, impaired UE functional use, improper body mechanics, and postural dysfunction.   PLAN:  PT FREQUENCY: 1x/week  PT DURATION: 12 weeks  PLANNED INTERVENTIONS: Therapeutic exercises, Therapeutic activity, Neuromuscular re-education, Balance training, Gait training, Patient/Family education, Self Care, Joint mobilization, Stair training, Moist heat, and Manual therapy  PLAN FOR NEXT SESSION:   Will continue to try to help him and his wife stay independent and functioning at home,   Patient Details  Name: JACARI IANNELLO MRN: [id number] Date of Birth: 1941-02-06 Referring Provider:  Donato Schultz, *  Encounter Date: 07/26/2023   Jearld Lesch, PT 07/26/2023, 2:51 PM  [facility] [address] [place], [place], [address] Phone: [phone]   Fax:  [phone]

## 2023-07-27 ENCOUNTER — Other Ambulatory Visit (HOSPITAL_COMMUNITY): Payer: Self-pay

## 2023-07-27 ENCOUNTER — Other Ambulatory Visit (HOSPITAL_COMMUNITY): Payer: Self-pay | Admitting: *Deleted

## 2023-07-27 ENCOUNTER — Other Ambulatory Visit (HOSPITAL_BASED_OUTPATIENT_CLINIC_OR_DEPARTMENT_OTHER): Payer: Self-pay

## 2023-07-27 MED ORDER — CARBAMAZEPINE 100 MG/5ML PO SUSP
400.0000 mg | Freq: Every day | ORAL | 3 refills | Status: DC
  Filled 2023-07-27: qty 600, 30d supply, fill #0

## 2023-07-27 MED ORDER — LORAZEPAM 1 MG PO TABS: 2.0000 mg | ORAL_TABLET | Freq: Every day | ORAL | 0 refills | Status: DC

## 2023-07-28 ENCOUNTER — Other Ambulatory Visit (HOSPITAL_BASED_OUTPATIENT_CLINIC_OR_DEPARTMENT_OTHER): Payer: Self-pay

## 2023-07-28 MED ORDER — LORAZEPAM 1 MG PO TABS
ORAL_TABLET | ORAL | 0 refills | Status: DC
  Filled 2023-07-28: qty 60, 30d supply, fill #0

## 2023-08-04 ENCOUNTER — Ambulatory Visit: Payer: PPO | Admitting: Physical Therapy

## 2023-08-04 DIAGNOSIS — C679 Malignant neoplasm of bladder, unspecified: Secondary | ICD-10-CM | POA: Diagnosis not present

## 2023-08-04 DIAGNOSIS — N1832 Chronic kidney disease, stage 3b: Secondary | ICD-10-CM | POA: Diagnosis not present

## 2023-08-04 DIAGNOSIS — F0390 Unspecified dementia without behavioral disturbance: Secondary | ICD-10-CM | POA: Diagnosis not present

## 2023-08-04 DIAGNOSIS — D631 Anemia in chronic kidney disease: Secondary | ICD-10-CM | POA: Diagnosis not present

## 2023-08-04 DIAGNOSIS — E559 Vitamin D deficiency, unspecified: Secondary | ICD-10-CM | POA: Diagnosis not present

## 2023-08-04 DIAGNOSIS — I679 Cerebrovascular disease, unspecified: Secondary | ICD-10-CM | POA: Diagnosis not present

## 2023-08-04 DIAGNOSIS — N189 Chronic kidney disease, unspecified: Secondary | ICD-10-CM | POA: Diagnosis not present

## 2023-08-04 DIAGNOSIS — I129 Hypertensive chronic kidney disease with stage 1 through stage 4 chronic kidney disease, or unspecified chronic kidney disease: Secondary | ICD-10-CM | POA: Diagnosis not present

## 2023-08-09 ENCOUNTER — Ambulatory Visit (HOSPITAL_COMMUNITY): Payer: PPO | Admitting: Psychiatry

## 2023-08-09 ENCOUNTER — Other Ambulatory Visit (HOSPITAL_BASED_OUTPATIENT_CLINIC_OR_DEPARTMENT_OTHER): Payer: Self-pay

## 2023-08-09 VITALS — BP 134/73 | HR 61 | Resp 18 | Ht 69.0 in | Wt 156.0 lb

## 2023-08-09 DIAGNOSIS — G4700 Insomnia, unspecified: Secondary | ICD-10-CM

## 2023-08-09 DIAGNOSIS — F03918 Unspecified dementia, unspecified severity, with other behavioral disturbance: Secondary | ICD-10-CM

## 2023-08-09 MED ORDER — LORAZEPAM 1 MG PO TABS
2.0000 mg | ORAL_TABLET | Freq: Every day | ORAL | 4 refills | Status: DC
  Filled 2023-08-09 – 2023-08-25 (×3): qty 60, 30d supply, fill #0
  Filled 2023-10-05: qty 60, 30d supply, fill #1
  Filled 2023-11-07: qty 60, 30d supply, fill #2
  Filled 2023-12-02 – 2023-12-12 (×3): qty 60, 30d supply, fill #3
  Filled 2024-01-09 (×2): qty 60, 30d supply, fill #4

## 2023-08-09 MED ORDER — TRAZODONE HCL 150 MG PO TABS
75.0000 mg | ORAL_TABLET | Freq: Every day | ORAL | 6 refills | Status: DC
  Filled 2023-08-09 – 2023-08-29 (×2): qty 20, 40d supply, fill #0
  Filled 2023-10-05: qty 20, 40d supply, fill #1

## 2023-08-09 MED ORDER — LORAZEPAM 1 MG PO TABS: 2.0000 mg | ORAL_TABLET | Freq: Every day | ORAL | 4 refills | Status: DC

## 2023-08-09 MED ORDER — CARBAMAZEPINE 100 MG/5ML PO SUSP
400.0000 mg | Freq: Every day | ORAL | 3 refills | Status: DC
  Filled 2023-08-09 – 2023-08-27 (×2): qty 600, 30d supply, fill #0
  Filled 2023-09-22: qty 600, 30d supply, fill #1

## 2023-08-09 NOTE — Progress Notes (Signed)
     Today the patient is doing only fairly well.  His wife is taking great care of him.  He is wheelchair bound.  He does go to [facility] 3 days a week.  His Tegretol level came back in the therapeutic range and for now we will continue it.  The wife says it has always been very helpful together with the Ativan that he is taking.  The patient is eating kind of poorly.  He has lost weight.  He is sleeping well.  He does not have really persistent agitation just moments when he gets irritable.  Generally he has no complaints.  He says he is happy with things.  He says the only complaint he has is that his wife has cold hands when she helps him.  Overall he seems to be reasonably positive about things.  He does seem to be failing and is thriving in terms of his weight loss.  He is incontinent of urine and stool.  He takes Sinemet from his neurologist and apparently it is actually helpful.  His tremor is less as posturing is better.  He is less stiff.  His wife says she can see a clear difference with the addition of the Sinemet.      Today's date is 08/09/2023.  Assessment and plan indicates the patient is actually doing fairly well.  He is slowly failing in his functioning but he is getting excellent care.  The Tegretol seems to be helpful and we will continue it as it is together with his Ativan.  He also takes 75 mg of Desyrel for sleep which helps him sleep a great deal.  The patient is not over sedated.  He has periods where he appears more confused but is transient.  The patient return to see me in 3 months.    Today's date is 03/29/2023.  The patient is diagnosed with Parkinson's dementia.  He is also diagnosed with an adjustment disorder with anxious mood state.  This is a state that requires him to be on a low dose of Ativan that he has been taking now for years.  It is very helpful for sleep.  He also takes a little bit in the morning and some in the afternoon which helps his  agitation.  Says both for helping him sleep but also having an antiagitation affect.  The Ativan together with his Tegretol have been very effective.  For sleep he also takes some trazodone.

## 2023-08-11 ENCOUNTER — Ambulatory Visit: Payer: PPO | Admitting: Physical Therapy

## 2023-08-11 ENCOUNTER — Encounter: Payer: Self-pay | Admitting: Physical Therapy

## 2023-08-11 DIAGNOSIS — M6281 Muscle weakness (generalized): Secondary | ICD-10-CM | POA: Diagnosis not present

## 2023-08-11 DIAGNOSIS — R2689 Other abnormalities of gait and mobility: Secondary | ICD-10-CM

## 2023-08-11 NOTE — Therapy (Signed)
 OUTPATIENT PHYSICAL THERAPY NEURO TREATMENT  Patient Name: William [name] MRN: [id number] DOB:[birth year], 83 y.o., male Today's Date: 08/11/2023  PCP: Donato Schultz  DO REFERRING PROVIDER: same   END OF SESSION:  PT End of Session - 08/11/23 1514     Visit Number [id number]    Date for PT Re-Evaluation 09/18/23    Authorization Type HTA    PT Start Time 1440   pt few minutes late   PT Stop Time 1512    PT Time Calculation (min) 32 min    Activity Tolerance Patient tolerated treatment well;Other (comment)   didn't sleep well last night, tired today   Behavior During Therapy WFL for tasks assessed/performed                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   HTN (hypertension) 07/19/2015   Hyperlipidemia    Major neurocognitive disorder 01/09/2014   Mild, related to stroke history   Nocturia    Renal insufficiency 06/25/2013   S/P carotid endarterectomy    BILATERAL ICA--  PATENT PER DUPLEX  05-19-2012   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/26/2017   Right calf   Urinary frequency    Vitamin D deficiency    Past Surgical History:  Procedure Laterality Date   APPENDECTOMY  AS CHILD   CARDIOVASCULAR STRESS TEST  03-27-2012  DR CRENSHAW   LOW RISK LEXISCAN STUDY-- PROBABLE NORMAL PERFUSION AND SOFT TISSUE ATTENUATION/  NO ISCHEMIA/ EF 51%   CAROTID ENDARTERECTOMY Bilateral LEFT  11-12-2008  DR GREG HAYES   RIGHT ICA  2006  ([facility])   CHOLECYSTECTOMY N/A 02/23/2022   Procedure: LAPAROSCOPIC CHOLECYSTECTOMY;  Surgeon: Quentin Ore, MD;   Location: [facility];  Service: General;  Laterality: N/A;   CYSTOSCOPY W/ RETROGRADES Bilateral 06/22/2021   Procedure: CYSTOSCOPY WITH RETROGRADE PYELOGRAM;  Surgeon: Marcine Matar, MD;  Location: [facility] ;  Service: Urology;  Laterality: Bilateral;   CYSTOSCOPY WITH LITHOLAPAXY N/A 02/26/2013   Procedure: CYSTOSCOPY WITH LITHOLAPAXY;  Surgeon: Marcine Matar, MD;  Location: [facility];  Service: Urology;  Laterality: N/A;   ENDOSCOPIC RETROGRADE CHOLANGIOPANCREATOGRAPHY (ERCP) WITH PROPOFOL N/A 02/22/2022   Procedure: ENDOSCOPIC RETROGRADE CHOLANGIOPANCREATOGRAPHY (ERCP) WITH PROPOFOL;  Surgeon: Jeani Hawking, MD;  Location: [facility] ENDOSCOPY;  Service: Gastroenterology;  Laterality: N/A;   EYE SURGERY  Jan. 2016   cataract surgery both eyes   INGUINAL HERNIA REPAIR Right 11-08-2006   IR KYPHO EA ADDL LEVEL THORACIC OR LUMBAR  02/12/2021   IR RADIOLOGIST EVAL & MGMT  02/18/2021   MASS EXCISION N/A 03/03/2016   Procedure: EXCISION OF BACK  MASS;  Surgeon: Almond Lint, MD;  Location: [facility];  Service: General;  Laterality: N/A;   MOHS SURGERY Left 1/ 2016   Dr Margo Aye-- Basal cell   PROSTATE SURGERY     REMOVAL OF STONES  02/22/2022   Procedure: REMOVAL OF STONES;  Surgeon: Jeani Hawking, MD;  Location: [facility] ENDOSCOPY;  Service: Gastroenterology;;  SPHINCTEROTOMY  02/22/2022   Procedure: SPHINCTEROTOMY;  Surgeon: Jeani Hawking, MD;  Location: [facility] ENDOSCOPY;  Service: Gastroenterology;;   TRANSURETHRAL RESECTION OF BLADDER TUMOR WITH MITOMYCIN-C N/A 06/22/2021   Procedure: TRANSURETHRAL RESECTION OF BLADDER TUMOR;  Surgeon: Marcine Matar, MD;  Location: [facility];  Service: Urology;  Laterality: N/A;   TRANSURETHRAL RESECTION OF PROSTATE N/A 02/26/2013   Procedure: TRANSURETHRAL RESECTION OF THE PROSTATE WITH GYRUS INSTRUMENTS;  Surgeon: Marcine Matar, MD;  Location: [facility];  Service: Urology;  Laterality: N/A;    TRANSURETHRAL RESECTION OF PROSTATE N/A 06/22/2021   Procedure: TRANSURETHRAL RESECTION OF THE PROSTATE (TURP);  Surgeon: Marcine Matar, MD;  Location: [facility];  Service: Urology;  Laterality: N/A;   Patient Active Problem List   Diagnosis Date Noted   Bronchitis 06/16/2023   Impacted cerumen of left ear 06/16/2023   Acute encephalopathy 04/18/2023   Parkinson's disease (HCC) 04/18/2023   History of CVA with residual deficit 04/18/2023   Chronic kidney disease, stage 3b (HCC) 04/18/2023   Dementia with behavioral disturbance (HCC) 04/18/2023   Anemia of chronic disease 04/18/2023   Laceration of right upper extremity 02/24/2023   Dermatitis 02/24/2023   Lung nodules 02/24/2023   Dysuria 08/03/2022   Nonintractable headache 07/01/2022   Bilateral impacted cerumen 06/24/2022   Rash 06/15/2022   Postoperative ileus (HCC) 02/27/2022   Ileus, postoperative (HCC) 02/26/2022   Choledocholithiasis 02/19/2022   DNR (do not resuscitate) 02/19/2022   Left elbow pain 01/26/2022   Mid back pain on left side 01/26/2022   Rib pain 01/26/2022   Acute pain of left shoulder 11/12/2021   Leukocytes in urine 11/12/2021   Urinary frequency 11/12/2021   Thrush 10/08/2021   Hemiplegia, 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   Insomnia    Prediabetes    Acute 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 (HCC)    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   Transaminitis 07/27/2021   UTI (urinary tract infection) 07/27/2021   CVA (cerebral vascular accident) (HCC) 07/27/2021   Fall 07/27/2021   Hyperglycemia 07/27/2021   Cholelithiasis 07/27/2021   Hypoxia 07/27/2021   Nausea and vomiting 07/27/2021   Acute metabolic encephalopathy 07/27/2021   Normocytic anemia 07/27/2021   Chronic back pain 07/27/2021   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   Closed fracture of first lumbar vertebra with routine  healing 02/03/2021   Closed fracture of multiple ribs 11/18/2020   Anxiety 01/29/2020   Leg pain, bilateral 01/29/2020   Ingrown toenail 07/13/2019   Lumbar spondylosis 05/02/2018   Pain in left knee 03/09/2018   Osteoarthritis of left hip 01/16/2018   Trochanteric bursitis of left hip 01/16/2018   Preventative health care 09/26/2017   HTN (hypertension) 07/19/2015   Hyperlipidemia 07/19/2015   Great toe pain 02/11/2014   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   Obesity (BMI 30-39.9) 06/25/2013   Renal insufficiency 06/25/2013   Weakness of left arm 06/25/2013   Sebaceous cyst 03/03/2011   Sprain of lumbar region 07/31/2010   Rib pain, left 08/29/2009   Carotid artery stenosis, asymptomatic, bilateral 05/02/2009   Eczema, atopic 05/31/2008   Vitamin D deficiency 03/01/2008   BPH (benign prostatic hyperplasia) 08/06/2007   Fasting hyperglycemia 12/21/2006   History of right MCA infarct 2006    ONSET DATE: 06/29/22 REFERRING DIAG:  Diagnosis  I63.9 (ICD-10-CM) - Cerebrovascular accident (CVA), unspecified mechanism (HCC)  W19.XXXD (ICD-10-CM) - Fall, subsequent encounter  THERAPY DIAG:  Muscle weakness (generalized)  Other abnormalities of gait and mobility  Rationale for Evaluation and Treatment: Rehabilitation  SUBJECTIVE:                                                                                                                                                                                         SUBJECTIVE STATEMENT:   Doing OK. Didn't sleep well last night, back hurts today.      PERTINENT HISTORY:  SHELBY ANDERLE is a 83 year old man with dementia, CKD, HTN, CAD, HLD and history of CVA  (2006, 2023), Parkinson's  PAIN:  Are you having pain? Faces/behavior scale- mild pain BLEs  and back   PRECAUTIONS: Fall  WEIGHT BEARING RESTRICTIONS: No  FALLS: Has patient fallen in last 6 months? No  LIVING ENVIRONMENT: Lives with: lives with  their family and lives with their spouse Lives in: House/apartment Stairs:  1 brick high step Has following equipment at home: Environmental consultant - 2 wheeled, Wheelchair (manual), Shower bench, bed side commode, Grab bars, and hospital bed, sliding pad for car  PLOF: Independent with basic ADLs and Independent with household mobility without device  PATIENT GOALS: Walk around the block with his wife. Be able to use the dining room chair rather than have to use the W/C. Increased I with bed mobility and simple tasks at home like make some coffee or brush his teeth, Step over tub to shower, has bench, but he can't really use it. Up and down the one step to get to back deck. 11/23/22: Patient's wife [name] updated his goals today: Walk around store (gets distracted), swing a golf club again (thinking of driving range), get up on his own, walk without leaning forward, more recognition of the left side (from CVA), not need gait belt, OT-- be able to get dressed more on his own, navigate the newspaper.  OBJECTIVE:  TREATMENT:    08/11/23  Nustep L5 all four extremities x8 minutes Mod cues for good machine use Lateral trunk crunches (limited on the L due to shoulder pain) with light ball toss in between x10 B  STS no UEs up to tip toes to help counter posterior lean when first standing up x10, up to MinA as fatigue increased  Tried double march in sitting with transition to march in standing but this was a bit overwhelming for him Alternating toe taps on 4 inch step progressing to 8 inch step with intermittent cues to focus on task at hand  Forward steps to target to promote improved step length, Mod multimodal cues  Light L shoulder stretching for flexion to tolerance  07/26/23 Getting in and out of car with him due to rain, he struggled due to a lot going on and it seemed to fluster him some Nustep level 5 x 7 minutes UBE level 3 x 6 minutes Supine bridges Partial sit ups Standing ball toss and  volleyball some cues for left hand Gait with HHA x 3 laps Practice with him and wife getting in and out of bed, x3  07/21/23 Gait outside with HHA on the right negotiating curbs, cues for step length and posture, around to the front door,  Nustep level 5 x 7 minutes 2.5# LAQ 2.5# [employer] with wife regarding renewal and what is going on with Nadine Counts, she reports that in the morning he freezes when trying to go to the bathroom, and cannot sit, she also reports that she is using Max A to get him from supine to sit.  We practiced this with Nadine Counts in the bathroom and today he had no big issues of freezing. I started some education with wife on some physics of levers and fulcrums of trying to get him out of bed from supine Pball press for core work Retested [place] and 5XSTS  07/14/23 Nustep level 5 x 7 minutes UBE level 2 x 4 minutes Passive stretch hand, fingers, wrist, elbow and shoulder left Standing step turn reach touch Standing reaching for numbers on the wall with left hand and right Sit to stand with cues for both arms to push up. Gait outside with HHA 200 feet then rest and then 200 feet through some grass and uneven terrain to the car  07/07/23 Nustep L5 x 8 min for neural priming Sit<>stand with hands on fitter poles x15 Sitting and forward reach for lumbar mobility x10 Sitting hands on the side (L hand on ball to keep palm as open as possible due to inability to fully extend fingers and wrist to keep his hand flat on mat table) with scap squeeze/chest up to ceiling for trunk mobility x10 Sitting and side reach and then rotation for trunk mobility and UE ROM 2x10 Standing big step forward with hands on fitter poles 2x10 Standing big side step L&R with hands on fitter poles 2x10 Supine side step 2x10 L&R Supine butterfly stretch within tolerable range Supine LTR x10 Supine UE reach towards ceilings for trunk and UE ROM x10 Supine side reach towards walls for trunk and UE ROM x10 pt  notes this hurt more Gentle PROM of L elbow and shoulder in supine Amb x3 laps with CGA, holding on pt's L hand to keep him from guarding/holding L UE in spastic flexion  06/28/23 Nustep level 4 x 7 minutes PT passive stretch of the left fingers, left wrist, left elbow and left shoulder Some AAROM of the left elbow and shoulder Some STM to the left upper trap and left forearm Heat on the back while doing the above UBE level 3 x 6 minutes Seated ball toss Standing ball toss and ball kicks  06/21/23 Seated PWR! Sit<>stand x10 Standing stepping on to numbers on floor R&L Side lunge on to numbers and then performed with cognitive tasks (I.e. adding/subtracting) High knees up into pillow Side kick into pillow Punching into pillow Box stepping on numbers with HHA CW & CCW Amb x 4 laps around gym with gait belt, CGA for safety (too much forward lean)   06/14/23 Nustep level 5 x 7 minutes Bike level 4 x 6 minutes Gait with HHA and belt outside asphalt 200 feet rest  then 300 feet Standing balance touching numbers on wall did miss some on the left side needed some cues, to look to the left and to not hold on, did right arm reaching and left arm reaching [employer] Side stepping, backward walking Gait outside in the grass x 200 feet  PATIENT EDUCATION: Education details: none new 02/16/23 Person educated: Patient and Spouse Education method: Explanation, Demonstration, Tactile cues, and Verbal cues Education comprehension: verbalized understanding, returned demonstration, verbal cues required, tactile cues required, and needs further education   ------------------------------------------------------------------------------------------------------- (Measures in this section from initial evaluation unless otherwise noted) DIAGNOSTIC FINDINGS:  MRI 2021 with degenerative changes in lumbar spine, L3-4 R subarticular stenosis COGNITION: Overall cognitive status: History of  cognitive impairments - at baseline SENSATION: Not tested COORDINATION: Moderately impaired BLE MUSCLE LENGTH: Hamstrings: severely restricted B Thomas test: Severely restricted B. POSTURE: rounded shoulders, forward head, increased thoracic kyphosis, posterior pelvic tilt, and flexed trunk  LOWER EXTREMITY ROM:   BLE extremely stiff throughout, limited hip ROM in all planes, B knees limited in extension. LOWER EXTREMITY MMT:  3+/5 throughout. Unable to determine accuracy of MMT due to cognition. Functionally demosntrates poor coordinated activation and poor muscular endurance. BED MOBILITY: Min A for Supine<> Sit. Patient was using a ladder in his hospital bed and could move MI. TRANSFERS: Min A, mod VC, tends to lean back.  11/02/22 independent with use of hands on arm chair with a couple of tries CURB: Min A-Has one step out to his deck. STAIRS: N/A  GAIT: Gait pattern: step to pattern, decreased arm swing- Right, decreased arm swing- Left, decreased step length- Right, decreased step length- Left, shuffling, festinating, trunk flexed, narrow BOS, poor foot clearance- Right, and poor foot clearance- Left Distance walked: 79' Assistive device utilized: Environmental consultant - 2 wheeled Level of assistance: Occasional min A Comments: mod VC to stay close to walker, take longer steps. FUNCTIONAL TESTS:  5 times sit to stand: 38.23  09/29/22  could not performs 5XSTS, 11/02/22 = 47 seconds with cues to use arms, 07/21/23 really struggled today 55 seconds Timed up and go (TUG): 47.91  with RW, 07/21/22 TUG 29  seconds without device, 09/29/22 25 seconds, 11/02/22 TUG 20 seconds, 07/21/23 = 26 seconds no device SBA                                                                     PATIENT EDUCATION: Education details: Progress towards goals, POC Person educated: Patient and Spouse Education method: Programmer, multimedia, Facilities manager, and Verbal cues  Education comprehension: verbalized understanding, returned  demonstration, verbal cues required, and needs further education  HOME EXERCISE PROGRAM: PWR! moves  GOALS: Goals reviewed with patient? Yes  SHORT TERM GOALS: Target date: 12/22/22  Perform HEP for PWR! Moves with cues from family. Baseline: Pt's wife, [name], notes walking and hitting beach ball as main HEP. Goal status: GOAL MET, 12/20/2022   2. Decrease 5 x STS to < 35 sec to demonstrate improved functional strength  Baseline: 47 seconds on 5/21 per note; 34.5 sec UE support 12/20/2022  Goal status: GOAL MET, 12/20/2022   3. Perform sit <> stand transfers from average chair height with Supervision.  Baseline: CGA  12/20/2022; (01/10/23) CGA  Goal status: NOT MET  LONG TERM GOALS: Target date: 04/22/23  Patient will perform HEP progression with caregiver assist. Baseline: has minimal HEP Goal status: ONGOING  2.  Decrease 5 x STS to < 25 sec to demonstrate improved functional strength Baseline: 47 seconds on 5/21 per note; (01/10/23) 36 sec w/ cues for speed/sequence>30.38 sec with BUE supprot Goal status: PARTIALLY MET, 01/24/2023  3. Improve quality of gait to be able to ambulate x 100 feet with supervision. Baseline: CGA with cues  Goal status: REVISED  4.  Patient will perform his bed mobility with MI including rolling, sit<>supine, and scooting in bed. Baseline: on hard surface mat he is independent but on soft bed at home he struggles, wife reports only needs a hand to get up now 11/02/22; (01/10/23) modified indep.  Goal status: MET  5.  Patient will demonstrate floor<>stand with CGA and verbal cues for patient's wife to be able to assist. Baseline:  Has to call 9-1-1; +2 max assist floor>stand with UE support 01/19/2023 Goal status: GOAL NOT MET 01/19/2023  **UPDATED GOALS BELOW ** per recert 05/17/2023  SHORT TERM GOALS: Target date: 02/25/2023   Pt will perform HEP to maintain lower body strength, flexibility, posture with wife's assistance. Baseline:  Goal status: met  04/12/23  2. Pt will perform sit to stand, 8 of 10 reps with BUE support, with supervision, to maintain safety with transfers.  Baseline: min guard  Goal status: 02/07/23-CGA from elevated surface and max VC to lean forward, ongoing   LONG TERM GOALS: Target date: 07/18/23   Patient will perform HEP progression to maintain lower body strength, flexibility, posture with wife's assistance. Baseline:  Goal status: ongoing but progressing 07/21/23  2.  Pt will ambulate 150-175 ft in 2 MWT with min guard assist, to preserve functional mobility within the home.  Baseline: Goal status:  progressing , needs CGA  3. Wife will verbalize tips for fall prevention education to minimize fall frequency Baseline:  Goal status:progressing and doing well 4.  Patient will be able to toilet without freezing for over a 4 week period 5.  Patient will be able to get out of bed from supine with wife assist minimal Assist   ASSESSMENT:  CLINICAL IMPRESSION:   Warmed up a bit on Nustep today, otherwise worked on general core strengthening and coordination activities to help with bed mobility. He was a bit tired today, didn't sleep well last night. Did OK but needed heavy cues for all activities. Had a hard time multitasking in busy environments and needed Mod multimodal cues for most activities today.   OBJECTIVE IMPAIRMENTS: Abnormal gait, decreased activity tolerance, decreased balance, decreased cognition, decreased coordination, decreased endurance, decreased mobility, difficulty walking, decreased ROM, decreased strength, decreased safety awareness, impaired flexibility, impaired UE functional use, improper body mechanics, and postural dysfunction.   PLAN:  PT FREQUENCY: 1x/week  PT DURATION: 12 weeks  PLANNED INTERVENTIONS: Therapeutic exercises, Therapeutic activity, Neuromuscular re-education, Balance training, Gait training, Patient/Family education, Self Care, Joint mobilization, Stair training,  Moist heat, and Manual therapy  PLAN FOR NEXT SESSION:   Will continue to try to help him and his wife stay independent and functioning at home, how is bed mobility going?   Patient Details  Name: REYMOND MAYNEZ MRN: [id number] Date of Birth: Aug 15, 1940 Referring Provider:  Donato Schultz, *  Encounter Date: 08/11/2023  Nedra Hai, PT, DPT 08/11/23 3:15 PM   [facility] [address] [place], [place], [address] Phone:  [phone]   Fax:  [phone]

## 2023-08-16 ENCOUNTER — Other Ambulatory Visit (HOSPITAL_BASED_OUTPATIENT_CLINIC_OR_DEPARTMENT_OTHER): Payer: Self-pay

## 2023-08-18 ENCOUNTER — Ambulatory Visit: Payer: PPO | Attending: Family Medicine | Admitting: Physical Therapy

## 2023-08-18 ENCOUNTER — Encounter: Payer: Self-pay | Admitting: Physical Therapy

## 2023-08-18 DIAGNOSIS — M6281 Muscle weakness (generalized): Secondary | ICD-10-CM | POA: Insufficient documentation

## 2023-08-18 DIAGNOSIS — R262 Difficulty in walking, not elsewhere classified: Secondary | ICD-10-CM | POA: Diagnosis not present

## 2023-08-18 DIAGNOSIS — M25512 Pain in left shoulder: Secondary | ICD-10-CM | POA: Diagnosis not present

## 2023-08-18 DIAGNOSIS — Z9181 History of falling: Secondary | ICD-10-CM | POA: Insufficient documentation

## 2023-08-18 DIAGNOSIS — I69354 Hemiplegia and hemiparesis following cerebral infarction affecting left non-dominant side: Secondary | ICD-10-CM | POA: Diagnosis not present

## 2023-08-18 DIAGNOSIS — R296 Repeated falls: Secondary | ICD-10-CM | POA: Insufficient documentation

## 2023-08-18 DIAGNOSIS — R2689 Other abnormalities of gait and mobility: Secondary | ICD-10-CM | POA: Insufficient documentation

## 2023-08-18 NOTE — Therapy (Signed)
 OUTPATIENT PHYSICAL THERAPY NEURO TREATMENT  Patient Name: William [name] MRN: [id number] DOB:1941-05-28, 83 y.o., male Today's Date: 08/18/2023  PCP: Donato Schultz  DO REFERRING PROVIDER: same   END OF SESSION:  PT End of Session - 08/18/23 1449     Visit Number [id number]    Date for PT Re-Evaluation 09/18/23    Authorization Type HTA    PT Start Time 1443    PT Stop Time 1527    PT Time Calculation (min) 44 min    Equipment Utilized During Treatment Gait belt    Activity Tolerance Patient tolerated treatment well    Behavior During Therapy WFL for tasks assessed/performed                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   HTN (hypertension) 07/19/2015   Hyperlipidemia    Major neurocognitive disorder 01/09/2014   Mild, related to stroke history   Nocturia    Renal insufficiency 06/25/2013   S/P carotid endarterectomy    BILATERAL ICA--  PATENT PER DUPLEX  05-19-2012   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/26/2017   Right calf   Urinary frequency    Vitamin D deficiency    Past Surgical History:  Procedure Laterality Date   APPENDECTOMY  AS CHILD   CARDIOVASCULAR STRESS TEST  03-27-2012  DR CRENSHAW   LOW RISK LEXISCAN STUDY-- PROBABLE NORMAL PERFUSION AND SOFT TISSUE ATTENUATION/  NO ISCHEMIA/ EF 51%   CAROTID ENDARTERECTOMY Bilateral LEFT  11-12-2008  DR GREG HAYES   RIGHT ICA  2006  ([facility])   CHOLECYSTECTOMY N/A 02/23/2022   Procedure: LAPAROSCOPIC CHOLECYSTECTOMY;  Surgeon: Quentin Ore, MD;  Location: [facility] OR;  Service:  General;  Laterality: N/A;   CYSTOSCOPY W/ RETROGRADES Bilateral 06/22/2021   Procedure: CYSTOSCOPY WITH RETROGRADE PYELOGRAM;  Surgeon: Marcine Matar, MD;  Location: [facility] [place];  Service: Urology;  Laterality: Bilateral;   CYSTOSCOPY WITH LITHOLAPAXY N/A 02/26/2013   Procedure: CYSTOSCOPY WITH LITHOLAPAXY;  Surgeon: Marcine Matar, MD;  Location: [facility];  Service: Urology;  Laterality: N/A;   ENDOSCOPIC RETROGRADE CHOLANGIOPANCREATOGRAPHY (ERCP) WITH PROPOFOL N/A 02/22/2022   Procedure: ENDOSCOPIC RETROGRADE CHOLANGIOPANCREATOGRAPHY (ERCP) WITH PROPOFOL;  Surgeon: Jeani Hawking, MD;  Location: [facility] ENDOSCOPY;  Service: Gastroenterology;  Laterality: N/A;   EYE SURGERY  Jan. 2016   cataract surgery both eyes   INGUINAL HERNIA REPAIR Right 11-08-2006   IR KYPHO EA ADDL LEVEL THORACIC OR LUMBAR  02/12/2021   IR RADIOLOGIST EVAL & MGMT  02/18/2021   MASS EXCISION N/A 03/03/2016   Procedure: EXCISION OF BACK  MASS;  Surgeon: Almond Lint, MD;  Location: [facility];  Service: General;  Laterality: N/A;   MOHS SURGERY Left 1/ 2016   Dr Margo Aye-- Basal cell   PROSTATE SURGERY     REMOVAL OF STONES  02/22/2022   Procedure: REMOVAL OF STONES;  Surgeon: Jeani Hawking, MD;  Location: [facility] ENDOSCOPY;  Service: Gastroenterology;;   Dennison Mascot  02/22/2022  Procedure: SPHINCTEROTOMY;  Surgeon: Jeani Hawking, MD;  Location: [facility] END[facility]COPY;  Service: Gastroenterology;;   TRANSURETHRAL RESECTION OF BLADDER TUMOR WITH MITOMYCIN-C N/A 06/22/2021   Procedure: TRANSURETHRAL RESECTION OF BLADDER TUMOR;  Surgeon: Marcine Matar, MD;  Location: [facility];  Service: Urology;  Laterality: N/A;   TRANSURETHRAL RESECTION OF PROSTATE N/A 02/26/2013   Procedure: TRANSURETHRAL RESECTION OF THE PROSTATE WITH GYRUS INSTRUMENTS;  Surgeon: Marcine Matar, MD;  Location: [facility];  Service: Urology;  Laterality: N/A;   TRANSURETHRAL RESECTION OF  PROSTATE N/A 06/22/2021   Procedure: TRANSURETHRAL RESECTION OF THE PROSTATE (TURP);  Surgeon: Marcine Matar, MD;  Location: [facility];  Service: Urology;  Laterality: N/A;   Patient Active Problem List   Diagnosis Date Noted   Bronchitis 06/16/2023   Impacted cerumen of left ear 06/16/2023   Acute encephalopathy 04/18/2023   Parkinson's disease (HCC) 04/18/2023   History of CVA with residual deficit 04/18/2023   Chronic kidney disease, stage 3b (HCC) 04/18/2023   Dementia with behavioral disturbance (HCC) 04/18/2023   Anemia of chronic disease 04/18/2023   Laceration of right upper extremity 02/24/2023   Dermatitis 02/24/2023   Lung nodules 02/24/2023   Dysuria 08/03/2022   Nonintractable headache 07/01/2022   Bilateral impacted cerumen 06/24/2022   Rash 06/15/2022   Postoperative ileus (HCC) 02/27/2022   Ileus, postoperative (HCC) 02/26/2022   Choledocholithiasis 02/19/2022   DNR (do not resuscitate) 02/19/2022   Left elbow pain 01/26/2022   Mid back pain on left side 01/26/2022   Rib pain 01/26/2022   Acute pain of left shoulder 11/12/2021   Leukocytes in urine 11/12/2021   Urinary frequency 11/12/2021   Thrush 10/08/2021   Hemiplegia, 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   Insomnia    Prediabetes    Acute 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 (HCC)    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   Transaminitis 07/27/2021   UTI (urinary tract infection) 07/27/2021   CVA (cerebral vascular accident) (HCC) 07/27/2021   Fall 07/27/2021   Hyperglycemia 07/27/2021   Cholelithiasis 07/27/2021   Hypoxia 07/27/2021   Nausea and vomiting 07/27/2021   Acute metabolic encephalopathy 07/27/2021   Normocytic anemia 07/27/2021   Chronic back pain 07/27/2021   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   Closed fracture of first lumbar vertebra with routine healing 02/03/2021   Closed  fracture of multiple ribs 11/18/2020   Anxiety 01/29/2020   Leg pain, bilateral 01/29/2020   Ingrown toenail 07/13/2019   Lumbar spondylosis 05/02/2018   Pain in left knee 03/09/2018   Osteoarthritis of left hip 01/16/2018   Trochanteric bursitis of left hip 01/16/2018   Preventative health care 09/26/2017   HTN (hypertension) 07/19/2015   Hyperlipidemia 07/19/2015   Great toe pain 02/11/2014   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   Obesity (BMI 30-39.9) 06/25/2013   Renal insufficiency 06/25/2013   Weakness of left arm 06/25/2013   Sebaceous cyst 03/03/2011   Sprain of lumbar region 07/31/2010   Rib pain, left 08/29/2009   Carotid artery stenosis, asymptomatic, bilateral 05/02/2009   Eczema, atopic 05/31/2008   Vitamin D deficiency 03/01/2008   BPH (benign prostatic hyperplasia) 08/06/2007   Fasting hyperglycemia 12/21/2006   History of right MCA infarct 2006    ONSET DATE: 06/29/22 REFERRING DIAG:  Diagnosis  I63.9 (ICD-10-CM) - Cerebrovascular accident (CVA), unspecified mechanism (HCC)  W19.XXXD (ICD-10-CM) - Fall, subsequent encounter    THERAPY DIAG:  Muscle weakness (  generalized)  Other abnormalities of gait and mobility  Hemiplegia and hemiparesis following cerebral infarction affecting left non-dominant side (HCC)  History of falling  Difficulty in walking, not elsewhere classified  Repeated falls  Acute pain of left shoulder  Rationale for Evaluation and Treatment: Rehabilitation  SUBJECTIVE:                                                                                                                                                                                         SUBJECTIVE STATEMENT:   Reports back has been hurting, no shoulder pain, talked with wife about a home visit to look at the toilet and the bed transfers to decrease the stress on her neck and back  PERTINENT HISTORY:  DESTEN MANOR is a 83 year old man with dementia,  CKD, HTN, CAD, HLD and history of CVA  (2006, 2023), Parkinson's  PAIN:  Are you having pain? Faces/behavior scale- mild pain BLEs  and back   PRECAUTIONS: Fall  WEIGHT BEARING RESTRICTIONS: No  FALLS: Has patient fallen in last 6 months? No  LIVING ENVIRONMENT: Lives with: lives with their family and lives with their spouse Lives in: House/apartment Stairs:  1 brick high step Has following equipment at home: Environmental consultant - 2 wheeled, Wheelchair (manual), Shower bench, bed side commode, Grab bars, and hospital bed, sliding pad for car  PLOF: Independent with basic ADLs and Independent with household mobility without device  PATIENT GOALS: Walk around the block with his wife. Be able to use the dining room chair rather than have to use the W/C. Increased I with bed mobility and simple tasks at home like make some coffee or brush his teeth, Step over tub to shower, has bench, but he can't really use it. Up and down the one step to get to back deck. 11/23/22: Patient's wife [name] updated his goals today: Walk around store (gets distracted), swing a golf club again (thinking of driving range), get up on his own, walk without leaning forward, more recognition of the left side (from CVA), not need gait belt, OT-- be able to get dressed more on his own, navigate the newspaper.  OBJECTIVE:  TREATMENT: 08/18/23 Nustep level 5 x 9 minutes some cues for larger motions Bike level 5 x 6 minutes UBE level 4 x 4 minutes Sitting on the mat table elbow touches a lot of cues to come to the left side Partial sit ups 3x10 Bridges 3x10 Hooklying marches Seated ball taps left hand and right hand Supine to sit x 2 with min A Gait with HHA 2 x 220 feet fast pace, cues for BIG steps  08/11/23  Nustep L5 all four extremities x8 minutes Mod cues for good machine use Lateral trunk crunches (limited on the L due to shoulder pain) with light ball toss in between x10 B  STS no UEs up to tip toes to help counter  posterior lean when first standing up x10, up to MinA as fatigue increased  Tried double march in sitting with transition to march in standing but this was a bit overwhelming for him Alternating toe taps on 4 inch step progressing to 8 inch step with intermittent cues to focus on task at hand  Forward steps to target to promote improved step length, Mod multimodal cues  Light L shoulder stretching for flexion to tolerance   07/26/23 Getting in and out of car with him due to rain, he struggled due to a lot going on and it seemed to fluster him some Nustep level 5 x 7 minutes UBE level 3 x 6 minutes Supine bridges Partial sit ups Standing ball toss and volleyball some cues for left hand Gait with HHA x 3 laps Practice with him and wife getting in and out of bed, x3  07/21/23 Gait outside with HHA on the right negotiating curbs, cues for step length and posture, around to the front door,  Nustep level 5 x 7 minutes 2.5# LAQ 2.5# [employer] with wife regarding renewal and what is going on with Nadine Counts, she reports that in the morning he freezes when trying to go to the bathroom, and cannot sit, she also reports that she is using Max A to get him from supine to sit.  We practiced this with Nadine Counts in the bathroom and today he had no big issues of freezing. I started some education with wife on some physics of levers and fulcrums of trying to get him out of bed from supine Pball press for core work Retested [place] and 5XSTS  07/14/23 Nustep level 5 x 7 minutes UBE level 2 x 4 minutes Passive stretch hand, fingers, wrist, elbow and shoulder left Standing step turn reach touch Standing reaching for numbers on the wall with left hand and right Sit to stand with cues for both arms to push up. Gait outside with HHA 200 feet then rest and then 200 feet through some grass and uneven terrain to the car  07/07/23 Nustep L5 x 8 min for neural priming Sit<>stand with hands on fitter poles x15 Sitting and  forward reach for lumbar mobility x10 Sitting hands on the side (L hand on ball to keep palm as open as possible due to inability to fully extend fingers and wrist to keep his hand flat on mat table) with scap squeeze/chest up to ceiling for trunk mobility x10 Sitting and side reach and then rotation for trunk mobility and UE ROM 2x10 Standing big step forward with hands on fitter poles 2x10 Standing big side step L&R with hands on fitter poles 2x10 Supine side step 2x10 L&R Supine butterfly stretch within tolerable range Supine LTR x10 Supine UE reach towards ceilings for trunk and UE ROM x10 Supine side reach towards walls for trunk and UE ROM x10 pt notes this hurt more Gentle PROM of L elbow and shoulder in supine Amb x3 laps with CGA, holding on pt's L hand to keep him from guarding/holding L UE in spastic flexion  06/28/23 Nustep level 4 x 7 minutes PT passive stretch of the left fingers, left wrist, left elbow and left shoulder Some AAROM of the left elbow and shoulder Some  STM to the left upper trap and left forearm Heat on the back while doing the above UBE level 3 x 6 minutes Seated ball toss Standing ball toss and ball kicks  PATIENT EDUCATION: Education details: none new 02/16/23 Person educated: Patient and Spouse Education method: Explanation, Demonstration, Tactile cues, and Verbal cues Education comprehension: verbalized understanding, returned demonstration, verbal cues required, tactile cues required, and needs further education   ------------------------------------------------------------------------------------------------------- (Measures in this section from initial evaluation unless otherwise noted) DIAGNOSTIC FINDINGS:  MRI 2021 with degenerative changes in lumbar spine, L3-4 R subarticular stenosis COGNITION: Overall cognitive status: History of cognitive impairments - at baseline SENSATION: Not tested COORDINATION: Moderately impaired BLE MUSCLE  LENGTH: Hamstrings: severely restricted B Thomas test: Severely restricted B. POSTURE: rounded shoulders, forward head, increased thoracic kyphosis, posterior pelvic tilt, and flexed trunk  LOWER EXTREMITY ROM:   BLE extremely stiff throughout, limited hip ROM in all planes, B knees limited in extension. LOWER EXTREMITY MMT:  3+/5 throughout. Unable to determine accuracy of MMT due to cognition. Functionally demosntrates poor coordinated activation and poor muscular endurance. BED MOBILITY: Min A for Supine<> Sit. Patient was using a ladder in his hospital bed and could move MI. TRANSFERS: Min A, mod VC, tends to lean back.  11/02/22 independent with use of hands on arm chair with a couple of tries CURB: Min A-Has one step out to his deck. STAIRS: N/A  GAIT: Gait pattern: step to pattern, decreased arm swing- Right, decreased arm swing- Left, decreased step length- Right, decreased step length- Left, shuffling, festinating, trunk flexed, narrow BOS, poor foot clearance- Right, and poor foot clearance- Left Distance walked: 25' Assistive device utilized: Environmental consultant - 2 wheeled Level of assistance: Occasional min A Comments: mod VC to stay close to walker, take longer steps. FUNCTIONAL TESTS:  5 times sit to stand: 38.23  09/29/22  could not performs 5XSTS, 11/02/22 = 47 seconds with cues to use arms, 07/21/23 really struggled today 55 seconds Timed up and go (TUG): 47.91  with RW, 07/21/22 TUG 29  seconds without device, 09/29/22 25 seconds, 11/02/22 TUG 20 seconds, 07/21/23 = 26 seconds no device SBA                                                                     PATIENT EDUCATION: Education details: Progress towards goals, POC Person educated: Patient and Spouse Education method: Programmer, multimedia, Facilities manager, and Verbal cues  Education comprehension: verbalized understanding, returned demonstration, verbal cues required, and needs further education  HOME EXERCISE PROGRAM: PWR! moves  GOALS: Goals  reviewed with patient? Yes  SHORT TERM GOALS: Target date: 12/22/22  Perform HEP for PWR! Moves with cues from family. Baseline: Pt's wife, [name], notes walking and hitting beach ball as main HEP. Goal status: GOAL MET, 12/20/2022   2. Decrease 5 x STS to < 35 sec to demonstrate improved functional strength  Baseline: 47 seconds on 5/21 per note; 34.5 sec UE support 12/20/2022  Goal status: GOAL MET, 12/20/2022   3. Perform sit <> stand transfers from average chair height with Supervision.  Baseline: CGA  12/20/2022; (01/10/23) CGA  Goal status: NOT MET  LONG TERM GOALS: Target date: 04/22/23  Patient will perform HEP progression with caregiver assist. Baseline: has minimal HEP  Goal status: ONGOING  2.  Decrease 5 x STS to < 25 sec to demonstrate improved functional strength Baseline: 47 seconds on 5/21 per note; (01/10/23) 36 sec w/ cues for speed/sequence>30.38 sec with BUE supprot Goal status: PARTIALLY MET, 01/24/2023  3. Improve quality of gait to be able to ambulate x 100 feet with supervision. Baseline: CGA with cues  Goal status: Met 08/18/23  4.  Patient will perform his bed mobility with MI including rolling, sit<>supine, and scooting in bed. Baseline: on hard surface mat he is independent but on soft bed at home he struggles, wife reports only needs a hand to get up now 11/02/22; (01/10/23) modified indep.  Goal status: MET  5.  Patient will demonstrate floor<>stand with CGA and verbal cues for patient's wife to be able to assist. Baseline:  Has to call 9-1-1; +2 max assist floor>stand with UE support 01/19/2023 Goal status: GOAL NOT MET 01/19/2023  **UPDATED GOALS BELOW ** per recert 05/17/2023  SHORT TERM GOALS: Target date: 02/25/2023   Pt will perform HEP to maintain lower body strength, flexibility, posture with wife's assistance. Baseline:  Goal status: met 04/12/23  2. Pt will perform sit to stand, 8 of 10 reps with BUE support, with supervision, to maintain safety with  transfers.  Baseline: min guard  Goal status: 02/07/23-CGA from elevated surface and max VC to lean forward, ongoing   LONG TERM GOALS: Target date: 07/18/23   Patient will perform HEP progression to maintain lower body strength, flexibility, posture with wife's assistance. Baseline:  Goal status: ongoing but progressing 07/21/23  2.  Pt will ambulate 150-175 ft in 2 MWT with min guard assist, to preserve functional mobility within the home.  Baseline: Goal status:  progressing , needs CGA  3. Wife will verbalize tips for fall prevention education to minimize fall frequency Baseline:  Goal status:progressing and doing well 4.  Patient will be able to toilet without freezing for over a 4 week period 5.  Patient will be able to get out of bed from supine with wife assist minimal Assist   ASSESSMENT:  CLINICAL IMPRESSION:   Continues to work on reciprocal movement patterns to help with Parkinson's.  Also worked some on core work, has difficulty going to his left and needs a lot of cues for this.  Did very good with walking today.  Talked with wife about meeting at their house to assess the bed mobility situation as he does well on our firm surfaces but our surfacxes cause some back pain  OBJECTIVE IMPAIRMENTS: Abnormal gait, decreased activity tolerance, decreased balance, decreased cognition, decreased coordination, decreased endurance, decreased mobility, difficulty walking, decreased ROM, decreased strength, decreased safety awareness, impaired flexibility, impaired UE functional use, improper body mechanics, and postural dysfunction.   PLAN:  PT FREQUENCY: 1x/week  PT DURATION: 12 weeks  PLANNED INTERVENTIONS: Therapeutic exercises, Therapeutic activity, Neuromuscular re-education, Balance training, Gait training, Patient/Family education, Self Care, Joint mobilization, Stair training, Moist heat, and Manual therapy  PLAN FOR NEXT SESSION:   Will continue to try to help him and his  wife stay independent and functioning at home, how is bed mobility going?   Patient Details  Name: William [name] MRN: [id number] Date of Birth: 08-Jun-1941 Referring Provider:  [name], [name], *  Stacie Glaze, PT

## 2023-08-22 ENCOUNTER — Other Ambulatory Visit (HOSPITAL_BASED_OUTPATIENT_CLINIC_OR_DEPARTMENT_OTHER): Payer: Self-pay

## 2023-08-22 ENCOUNTER — Other Ambulatory Visit: Payer: Self-pay

## 2023-08-23 ENCOUNTER — Telehealth (INDEPENDENT_AMBULATORY_CARE_PROVIDER_SITE_OTHER): Payer: Self-pay | Admitting: Otolaryngology

## 2023-08-23 NOTE — Telephone Encounter (Signed)
 Reminder Call:  Date: 08/25/2023 Status: Sch  Time: 9:30 AM Confirmed time and location w/patient-[id number] [place], [place] [address]

## 2023-08-25 ENCOUNTER — Encounter: Payer: Self-pay | Admitting: Physical Therapy

## 2023-08-25 ENCOUNTER — Encounter (INDEPENDENT_AMBULATORY_CARE_PROVIDER_SITE_OTHER): Payer: Self-pay

## 2023-08-25 ENCOUNTER — Ambulatory Visit (INDEPENDENT_AMBULATORY_CARE_PROVIDER_SITE_OTHER): Payer: PPO

## 2023-08-25 ENCOUNTER — Ambulatory Visit: Payer: PPO | Admitting: Physical Therapy

## 2023-08-25 ENCOUNTER — Other Ambulatory Visit: Payer: Self-pay

## 2023-08-25 ENCOUNTER — Other Ambulatory Visit (HOSPITAL_BASED_OUTPATIENT_CLINIC_OR_DEPARTMENT_OTHER): Payer: Self-pay

## 2023-08-25 VITALS — BP 128/71 | HR 69 | Ht 69.0 in | Wt 164.0 lb

## 2023-08-25 DIAGNOSIS — M6281 Muscle weakness (generalized): Secondary | ICD-10-CM | POA: Diagnosis not present

## 2023-08-25 DIAGNOSIS — H938X2 Other specified disorders of left ear: Secondary | ICD-10-CM | POA: Diagnosis not present

## 2023-08-25 DIAGNOSIS — H6122 Impacted cerumen, left ear: Secondary | ICD-10-CM

## 2023-08-25 DIAGNOSIS — M25512 Pain in left shoulder: Secondary | ICD-10-CM

## 2023-08-25 DIAGNOSIS — H903 Sensorineural hearing loss, bilateral: Secondary | ICD-10-CM

## 2023-08-25 DIAGNOSIS — I69354 Hemiplegia and hemiparesis following cerebral infarction affecting left non-dominant side: Secondary | ICD-10-CM

## 2023-08-25 DIAGNOSIS — R2689 Other abnormalities of gait and mobility: Secondary | ICD-10-CM

## 2023-08-25 DIAGNOSIS — R296 Repeated falls: Secondary | ICD-10-CM

## 2023-08-25 DIAGNOSIS — R262 Difficulty in walking, not elsewhere classified: Secondary | ICD-10-CM

## 2023-08-25 NOTE — Progress Notes (Signed)
 Dear Dr. [name], Here is my assessment for our mutual patient, William [name]. Thank you for allowing me the opportunity to care for your patient. Please do not hesitate to contact me should you have any other questions. Sincerely, Dr. Jovita Kussmaul  Otolaryngology Clinic Note Referring provider: Dr. [name] HPI:  William [name] is a 83 y.o. male kindly referred by Dr. [name] for evaluation of hearing loss  Patient reports: left sided hearing trouble and feeling of ear plugged for about 2 months, which is intermittent. No other ear symptoms he has noted. Tried debrox but did not help. PCP noted wax but unable to remove and referred for HL and cerumen impaction  Patient denies: ear pain, fullness, vertigo, drainage, tinnitus Patient additionally denies: deep pain in ear canal, eustachian tube symptoms such as popping, crackling, sensitive to pressure changes Patient also denies barotrauma, vestibular suppressant use, ototoxic medication use Prior ear surgery: no  Wife brings him.  .  Personal or FHx of bleeding dz or anesthesia difficulty: no  PMHx: Carotid stenosis, Stroke, Lung nodules, Bladder cancer, Weakness, CKD  Independent Review of Additional Tests or Records:  Dr. Jenne Pane (12/28/2023) [facility]: noted left ear hearing loss, no cerumen; Dx: hearing loss; Rx: rec HA, but not interested so f/u PRN Outside Audio 12/27/2013 reviewed ([facility]): some asymmetry.  Dr. [name] [facility] referral notes (06/21/2023): noted ear blockage, used debrox but did not help. Ears "appear bllack" - attempted manual earwax removal but not successful; Ref ENT as a result CT Head 05/10/2023 reviewed and interpreted independently with respect to ears: cuts thick so suboptimal for ears but mastoids and ME well aerated; no ossicular chain or otic capsule abnormality identified; some cerumen b/l ears MRI Brain 04/19/2023: no dedicated IAC cuts but independent interpretation shows no retrocochlear  lesions PMH/Meds/All/SocHx/FamHx/ROS:   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   HTN (hypertension) 07/19/2015   Hyperlipidemia    Major neurocognitive disorder 01/09/2014   Mild, related to stroke history   Nocturia    Renal insufficiency 06/25/2013   S/P carotid endarterectomy    BILATERAL ICA--  PATENT PER DUPLEX  05-19-2012   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   Right calf   Urinary frequency    Vitamin D deficiency      Past Surgical History:  Procedure Laterality Date   APPENDECTOMY  AS CHILD   CARDIOVASCULAR STRESS TEST  03-27-2012  DR CRENSHAW   LOW RISK LEXISCAN STUDY-- PROBABLE NORMAL PERFUSION AND SOFT TISSUE ATTENUATION/  NO ISCHEMIA/ EF 51%   CAROTID ENDARTERECTOMY Bilateral LEFT  11-12-2008  DR GREG HAYES   RIGHT ICA  2006  ([facility])   CHOLECYSTECTOMY N/A 02/23/2022   Procedure: LAPAROSCOPIC CHOLECYSTECTOMY;  Surgeon: Quentin Ore, MD;  Location: [facility];  Service: General;  Laterality: N/A;   CYSTOSCOPY W/ RETROGRADES Bilateral 06/22/2021   Procedure: CYSTOSCOPY WITH RETROGRADE PYELOGRAM;  Surgeon: Marcine Matar, MD;  Location: [facility];  Service: Urology;  Laterality: Bilateral;  CYSTOSCOPY WITH LITHOLAPAXY N/A 02/26/2013   Procedure: CYSTOSCOPY WITH LITHOLAPAXY;  Surgeon: Marcine Matar, MD;  Location: [facility];  Service: Urology;  Laterality: N/A;   ENDOSCOPIC RETROGRADE CHOLANGIOPANCREATOGRAPHY (ERCP) WITH PROPOFOL N/A 02/22/2022   Procedure: ENDOSCOPIC RETROGRADE CHOLANGIOPANCREATOGRAPHY  (ERCP) WITH PROPOFOL;  Surgeon: Jeani Hawking, MD;  Location: [facility] ENDOSCOPY;  Service: Gastroenterology;  Laterality: N/A;   EYE SURGERY  Jan. 2016   cataract surgery both eyes   INGUINAL HERNIA REPAIR Right 11-08-2006   IR KYPHO EA ADDL LEVEL THORACIC OR LUMBAR  02/12/2021   IR RADIOLOGIST EVAL & MGMT  02/18/2021   MASS EXCISION N/A 03/03/2016   Procedure: EXCISION OF BACK  MASS;  Surgeon: Almond Lint, MD;  Location: [facility];  Service: General;  Laterality: N/A;   MOHS SURGERY Left 1/ 2016   Dr Margo Aye-- Basal cell   PROSTATE SURGERY     REMOVAL OF STONES  02/22/2022   Procedure: REMOVAL OF STONES;  Surgeon: Jeani Hawking, MD;  Location: [facility] ENDOSCOPY;  Service: Gastroenterology;;   Dennison Mascot  02/22/2022   Procedure: Dennison Mascot;  Surgeon: Jeani Hawking, MD;  Location: [facility] ENDOSCOPY;  Service: Gastroenterology;;   TRANSURETHRAL RESECTION OF BLADDER TUMOR WITH MITOMYCIN-C N/A 06/22/2021   Procedure: TRANSURETHRAL RESECTION OF BLADDER TUMOR;  Surgeon: Marcine Matar, MD;  Location: [facility];  Service: Urology;  Laterality: N/A;   TRANSURETHRAL RESECTION OF PROSTATE N/A 02/26/2013   Procedure: TRANSURETHRAL RESECTION OF THE PROSTATE WITH GYRUS INSTRUMENTS;  Surgeon: Marcine Matar, MD;  Location: [facility];  Service: Urology;  Laterality: N/A;   TRANSURETHRAL RESECTION OF PROSTATE N/A 06/22/2021   Procedure: TRANSURETHRAL RESECTION OF THE PROSTATE (TURP);  Surgeon: Marcine Matar, MD;  Location: [facility];  Service: Urology;  Laterality: N/A;    Family History  Problem Relation Age of Onset   Heart disease Mother        CHF   Bipolar disorder Mother    Heart disease Father        CHF     Social Connections: Unknown (06/14/2023)   Social Connection and Isolation Panel [NHANES]    Frequency of Communication with Friends and Family: More than three times a week    Frequency of Social Gatherings with Friends and  Family: More than three times a week    Attends Religious Services: Patient declined    Database administrator or Organizations: No    Attends Engineer, structural: Not on file    Marital Status: Married      Current Outpatient Medications:    acetaminophen (TYLENOL) 325 MG tablet, Take 2 tablets (650 mg total) by mouth every 6 (six) hours as needed for mild pain., Disp: , Rfl:    amantadine (SYMMETREL) 100 MG capsule, Take 1 capsule (100 mg total) by mouth daily., Disp: 90 capsule, Rfl: 1   amLODipine (NORVASC) 10 MG tablet, Take 1 tablet (10 mg total) by mouth every morning., Disp: 90 tablet, Rfl: 1   bacitracin ointment, Apply 1 Application topically 2 (two) times daily., Disp: 120 g, Rfl: 0   carBAMazepine (TEGRETOL) 100 MG/5ML suspension, Take 20 mLs (400 mg total) by mouth daily. Take 5 mLs every morning, take 5 mLs at noon and 10 mLs at bedtime., Disp: 600 mL, Rfl: 3   carbidopa-levodopa (SINEMET IR) 25-100 MG tablet, Take 1.5 tablets by mouth 3 (three) times daily., Disp: 165 tablet, Rfl: 5   Cholecalciferol (VITAMIN D3) 50 MCG (2000 UT) CHEW, [place]  1 tablet by mouth daily., Disp: , Rfl:    clobetasol cream (TEMOVATE) 0.05 %, Apply 1 Application to affected areas topically up to 2 (two) times daily as needed. Do not apply to face, groin, or under arms, Disp: 60 g, Rfl: 3   clopidogrel (PLAVIX) 75 MG tablet, Take 1 tablet (75 mg total) by mouth daily., Disp: 90 tablet, Rfl: 0   cyanocobalamin (VITAMIN B12) 1000 MCG tablet, Take 1 tablet (1,000 mcg total) by mouth daily., Disp: 100 tablet, Rfl: 2   ferrous sulfate 325 (65 FE) MG EC tablet, Take 325 mg by mouth 2 (two) times daily. *Crushed*, Disp: , Rfl:    fluticasone (CUTIVATE) 0.05 % cream, 1 application  at bedtime as needed (psoriasis)., Disp: , Rfl: 3   hydrALAZINE (APRESOLINE) 25 MG tablet, Take 1 tablet (25 mg total) by mouth 3 (three) times daily., Disp: 270 tablet, Rfl: 0   LORazepam (ATIVAN) 1 MG tablet, Take 1/4 tablet  by mouth in the morning, 1/2 tablet by mouth at lunch and 1 tablet by mouth at bedtime, Disp: 60 tablet, Rfl: 2   LORazepam (ATIVAN) 1 MG tablet, Take 1/4 tablet (0.25 mg total) by mouth in the morning AND 1/2 tablet (0.5 mg total) daily in the afternoon AND 1 tablet (1 mg total) at bedtime., Disp: 60 tablet, Rfl: 0   LORazepam (ATIVAN) 1 MG tablet, Take 1/4th (0.25 mg) tablet in the morning, 1/2 (half) tablet at 2pm and 1 (1mg ) tablet by mouth at bedtime., Disp: 60 tablet, Rfl: 4   Multiple Vitamins-Minerals (ADULT ONE DAILY GUMMIES) CHEW, Chew 1 tablet by mouth every morning., Disp: , Rfl:    NONFORMULARY OR COMPOUNDED ITEM, Transport chair  dx parkinsons, hx falls, Disp: 1 each, Rfl: 0   pantoprazole (PROTONIX) 40 MG tablet, Take 1 tablet (40 mg total) by mouth daily., Disp: 90 tablet, Rfl: 3   polyethylene glycol (MIRALAX / GLYCOLAX) 17 g packet, Take 17 g by mouth daily. (Patient taking differently: Take 17 g by mouth every other day.), Disp: 14 each, Rfl: 0   tamsulosin (FLOMAX) 0.4 MG CAPS capsule, Take 1 capsule (0.4 mg total) by mouth daily after supper. (Patient taking differently: Take 0.4 mg by mouth at bedtime.), Disp: 90 capsule, Rfl: 1   tamsulosin (FLOMAX) 0.4 MG CAPS capsule, Take 1 capsule (0.4 mg total) by mouth at bedtime., Disp: 90 capsule, Rfl: 3   traZODone (DESYREL) 150 MG tablet, Take 0.5 tablets (75 mg total) by mouth at bedtime. *Crushed*, Disp: 20 tablet, Rfl: 6   triamcinolone cream (KENALOG) 0.1 %, Apply 1 application topically 2 (two) times daily., Disp: 30 g, Rfl: 0 No current facility-administered medications for this visit.  Facility-Administered Medications Ordered in Other Visits:    gemcitabine (GEMZAR) chemo syringe for bladder instillation 2,000 mg, 2,000 mg, Bladder Instillation, Once, Marcine Matar, MD   Physical Exam:   BP 128/71 (BP Location: Right Arm, Patient Position: Sitting, Cuff Size: Large)   Pulse 69   Ht 5\' 9"  (1.753 m)   Wt 164 lb (74.4  kg)   SpO2 98%   BMI 24.22 kg/m   Salient findings:  CN II-XII intact Given history and complaints, ear microscopy was indicated and performed for evaluation with findings as below in physical exam section and in procedures. Right EAC clear, left cerumen impaction; after removal of cerumen on left, both TM intact with well pneumatized middle ear spaces Weber 512: mid Rinne 512: AC > BC b/l  Anterior rhinoscopy: Septum intacat;  bilateral inferior turbinates without significant hypertrophy No lesions of oral cavity/oropharynx No obviously palpable neck masses/lymphadenopathy/thyromegaly No respiratory distress or stridor  Seprately Identifiable Procedures:  Procedure: Bilateral ear microscopy and cerumen removal using microscope (CPT [phone]) - Mod 25 Pre-procedure diagnosis: Cerumen impaction LEFT external ears Post-procedure diagnosis: same Indication: left cerumen impaction; given patient's otologic complaints and history as well as for improved and comprehensive examination of external ear and tympanic membrane, bilateral otologic examination using microscope was performed and impacted cerumen removed  Procedure: Patient was placed semi-recumbent. Both ear canals were examined using the microscope with findings above. Impacted Cerumen removed on left using suction and currette with improvement in EAC examination and patency. Patient tolerated the procedure well.  Impression & Plans:  Mckale Haffey is a 83 y.o. male with:  1. Sensorineural hearing loss (SNHL) of both ears   2. Impacted cerumen of left ear   3. Sensation of fullness in left ear    Left ear cerumen impaction and long-standing SNHL of both ears as noted on prior audio. Cerumen was cleared, and he reported improvement in fullness. Separately, for the hearing loss, I offered hearing test for mgmt for his hearing loss (likely hearing aid candidate), but they declined.  We discussed cerumen management strategies with baby  oil or mineral oil. D/w pt f/u, and will do PRN; advised to call if any issues  See below regarding exact medications prescribed this encounter including dosages and route: No orders of the defined types were placed in this encounter.     Thank you for allowing me the opportunity to care for your patient. Please do not hesitate to contact me should you have any other questions.  Sincerely, Jovita Kussmaul, MD Otolaryngologist (ENT), [facility] Phone: [phone] Fax: [phone]  08/25/2023, 9:49 AM   MDM:  Level 4 - [phone] Complexity/Problems addressed: mod - new problem, and a chronic problem Data complexity: mod - independent review of notes and tests; independent interpretation of imaging (CT) - Morbidity: low  - Prescription Drug prescribed or managed: no

## 2023-08-25 NOTE — Therapy (Signed)
 OUTPATIENT PHYSICAL THERAPY NEURO TREATMENT  Patient Name: William [name] MRN: [id number] DOB:03/06/1941, 83 y.o., male Today's Date: 08/25/2023  PCP: Donato Schultz  DO REFERRING PROVIDER: same   END OF SESSION:  PT End of Session - 08/25/23 1444     Visit Number [id number]    Date for PT Re-Evaluation 09/18/23    Authorization Type HTA    PT Start Time 1441    PT Stop Time 1525    PT Time Calculation (min) 44 min    Equipment Utilized During Treatment Gait belt    Activity Tolerance Patient tolerated treatment well    Behavior During Therapy WFL for tasks assessed/performed                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   HTN (hypertension) 07/19/2015   Hyperlipidemia    Major neurocognitive disorder 01/09/2014   Mild, related to stroke history   Nocturia    Renal insufficiency 06/25/2013   S/P carotid endarterectomy    BILATERAL ICA--  PATENT PER DUPLEX  05-19-2012   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/26/2017   Right calf   Urinary frequency    Vitamin D deficiency    Past Surgical History:  Procedure Laterality Date   APPENDECTOMY  AS CHILD   CARDIOVASCULAR STRESS TEST  03-27-2012  DR CRENSHAW   LOW RISK LEXISCAN STUDY-- PROBABLE NORMAL PERFUSION AND SOFT TISSUE ATTENUATION/  NO ISCHEMIA/ EF 51%   CAROTID ENDARTERECTOMY Bilateral LEFT  11-12-2008  DR GREG HAYES   RIGHT ICA  2006  ([facility])   CHOLECYSTECTOMY N/A 02/23/2022   Procedure: LAPAROSCOPIC CHOLECYSTECTOMY;  Surgeon: Quentin Ore, MD;  Location: [facility] OR;  Service:  General;  Laterality: N/A;   CYSTOSCOPY W/ RETROGRADES Bilateral 06/22/2021   Procedure: CYSTOSCOPY WITH RETROGRADE PYELOGRAM;  Surgeon: Marcine Matar, MD;  Location: [facility];  Service: Urology;  Laterality: Bilateral;   CYSTOSCOPY WITH LITHOLAPAXY N/A 02/26/2013   Procedure: CYSTOSCOPY WITH LITHOLAPAXY;  Surgeon: Marcine Matar, MD;  Location: [facility];  Service: Urology;  Laterality: N/A;   ENDOSCOPIC RETROGRADE CHOLANGIOPANCREATOGRAPHY (ERCP) WITH PROPOFOL N/A 02/22/2022   Procedure: ENDOSCOPIC RETROGRADE CHOLANGIOPANCREATOGRAPHY (ERCP) WITH PROPOFOL;  Surgeon: Jeani Hawking, MD;  Location: [facility] ENDOSCOPY;  Service: Gastroenterology;  Laterality: N/A;   EYE SURGERY  Jan. 2016   cataract surgery both eyes   INGUINAL HERNIA REPAIR Right 11-08-2006   IR KYPHO EA ADDL LEVEL THORACIC OR LUMBAR  02/12/2021   IR RADIOLOGIST EVAL & MGMT  02/18/2021   MASS EXCISION N/A 03/03/2016   Procedure: EXCISION OF BACK  MASS;  Surgeon: Almond Lint, MD;  Location: [facility];  Service: General;  Laterality: N/A;   MOHS SURGERY Left 1/ 2016   Dr Margo Aye-- Basal cell   PROSTATE SURGERY     REMOVAL OF STONES  02/22/2022   Procedure: REMOVAL OF STONES;  Surgeon: Jeani Hawking, MD;  Location: [facility] ENDOSCOPY;  Service: Gastroenterology;;   Dennison Mascot  02/22/2022  Procedure: SPHINCTEROTOMY;  Surgeon: Jeani Hawking, MD;  Location: [facility] ENDOSCOPY;  Service: Gastroenterology;;   TRANSURETHRAL RESECTION OF BLADDER TUMOR WITH MITOMYCIN-C N/A 06/22/2021   Procedure: TRANSURETHRAL RESECTION OF BLADDER TUMOR;  Surgeon: Marcine Matar, MD;  Location: [facility];  Service: Urology;  Laterality: N/A;   TRANSURETHRAL RESECTION OF PROSTATE N/A 02/26/2013   Procedure: TRANSURETHRAL RESECTION OF THE PROSTATE WITH GYRUS INSTRUMENTS;  Surgeon: Marcine Matar, MD;  Location: [facility];  Service: Urology;  Laterality: N/A;   TRANSURETHRAL RESECTION OF  PROSTATE N/A 06/22/2021   Procedure: TRANSURETHRAL RESECTION OF THE PROSTATE (TURP);  Surgeon: Marcine Matar, MD;  Location: [facility];  Service: Urology;  Laterality: N/A;   Patient Active Problem List   Diagnosis Date Noted   Bronchitis 06/16/2023   Impacted cerumen of left ear 06/16/2023   Acute encephalopathy 04/18/2023   Parkinson's disease (HCC) 04/18/2023   History of CVA with residual deficit 04/18/2023   Chronic kidney disease, stage 3b (HCC) 04/18/2023   Dementia with behavioral disturbance (HCC) 04/18/2023   Anemia of chronic disease 04/18/2023   Laceration of right upper extremity 02/24/2023   Dermatitis 02/24/2023   Lung nodules 02/24/2023   Dysuria 08/03/2022   Nonintractable headache 07/01/2022   Bilateral impacted cerumen 06/24/2022   Rash 06/15/2022   Postoperative ileus (HCC) 02/27/2022   Ileus, postoperative (HCC) 02/26/2022   Choledocholithiasis 02/19/2022   DNR (do not resuscitate) 02/19/2022   Left elbow pain 01/26/2022   Mid back pain on left side 01/26/2022   Rib pain 01/26/2022   Acute pain of left shoulder 11/12/2021   Leukocytes in urine 11/12/2021   Urinary frequency 11/12/2021   Thrush 10/08/2021   Hemiplegia, 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   Insomnia    Prediabetes    Acute 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 (HCC)    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   Transaminitis 07/27/2021   UTI (urinary tract infection) 07/27/2021   CVA (cerebral vascular accident) (HCC) 07/27/2021   Fall 07/27/2021   Hyperglycemia 07/27/2021   Cholelithiasis 07/27/2021   Hypoxia 07/27/2021   Nausea and vomiting 07/27/2021   Acute metabolic encephalopathy 07/27/2021   Normocytic anemia 07/27/2021   Chronic back pain 07/27/2021   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   Closed fracture of first lumbar vertebra with routine healing 02/03/2021   Closed  fracture of multiple ribs 11/18/2020   Anxiety 01/29/2020   Leg pain, bilateral 01/29/2020   Ingrown toenail 07/13/2019   Lumbar spondylosis 05/02/2018   Pain in left knee 03/09/2018   Osteoarthritis of left hip 01/16/2018   Trochanteric bursitis of left hip 01/16/2018   Preventative health care 09/26/2017   HTN (hypertension) 07/19/2015   Hyperlipidemia 07/19/2015   Great toe pain 02/11/2014   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   Obesity (BMI 30-39.9) 06/25/2013   Renal insufficiency 06/25/2013   Weakness of left arm 06/25/2013   Sebaceous cyst 03/03/2011   Sprain of lumbar region 07/31/2010   Rib pain, left 08/29/2009   Carotid artery stenosis, asymptomatic, bilateral 05/02/2009   Eczema, atopic 05/31/2008   Vitamin D deficiency 03/01/2008   BPH (benign prostatic hyperplasia) 08/06/2007   Fasting hyperglycemia 12/21/2006   History of right MCA infarct 2006    ONSET DATE: 06/29/22 REFERRING DIAG:  Diagnosis  I63.9 (ICD-10-CM) - Cerebrovascular accident (CVA), unspecified mechanism (HCC)  W19.XXXD (ICD-10-CM) - Fall, subsequent encounter    THERAPY DIAG:  Muscle weakness (  generalized)  Other abnormalities of gait and mobility  Hemiplegia and hemiparesis following cerebral infarction affecting left non-dominant side (HCC)  Difficulty in walking, not elsewhere classified  Repeated falls  Acute pain of left shoulder  Rationale for Evaluation and Treatment: Rehabilitation  SUBJECTIVE:                                                                                                                                                                                         SUBJECTIVE STATEMENT:   Reports back has been hurting, no shoulder pain, reports he got up too early, he reports tired and not having a good day PERTINENT HISTORY:  JUSTUN ANAYA is a 83 year old man with dementia, CKD, HTN, CAD, HLD and history of CVA  (2006, 2023), Parkinson's  PAIN:   Are you having pain? Faces/behavior scale- mild pain BLEs  and back   PRECAUTIONS: Fall  WEIGHT BEARING RESTRICTIONS: No  FALLS: Has patient fallen in last 6 months? No  LIVING ENVIRONMENT: Lives with: lives with their family and lives with their spouse Lives in: House/apartment Stairs:  1 brick high step Has following equipment at home: Environmental consultant - 2 wheeled, Wheelchair (manual), Shower bench, bed side commode, Grab bars, and hospital bed, sliding pad for car  PLOF: Independent with basic ADLs and Independent with household mobility without device  PATIENT GOALS: Walk around the block with his wife. Be able to use the dining room chair rather than have to use the W/C. Increased I with bed mobility and simple tasks at home like make some coffee or brush his teeth, Step over tub to shower, has bench, but he can't really use it. Up and down the one step to get to back deck. 11/23/22: Patient's wife [name] updated his goals today: Walk around store (gets distracted), swing a golf club again (thinking of driving range), get up on his own, walk without leaning forward, more recognition of the left side (from CVA), not need gait belt, OT-- be able to get dressed more on his own, navigate the newspaper.  OBJECTIVE:  TREATMENT: 08/25/23 Nustep level 5 x 6 minutes, cues for larger movements Bike level 4 x 6 minutes UBE level 3 x 5 mintues Worked on some bed mobility Bridges Partial sit ups Supine left leg sliding board hip abduction Seated volley ball with some cues to use the left hand Gait outside with hand hold assist and some cues for steps, some walking in grass  08/18/23 Nustep level 5 x 9 minutes some cues for larger motions Bike level 5 x 6 minutes UBE level 4 x 4 minutes Sitting on the mat table  elbow touches a lot of cues to come to the left side Partial sit ups 3x10 Bridges 3x10 Hooklying marches Seated ball taps left hand and right hand Supine to sit x 2 with min A Gait with  HHA 2 x 220 feet fast pace, cues for BIG steps  08/11/23  Nustep L5 all four extremities x8 minutes Mod cues for good machine use Lateral trunk crunches (limited on the L due to shoulder pain) with light ball toss in between x10 B  STS no UEs up to tip toes to help counter posterior lean when first standing up x10, up to MinA as fatigue increased  Tried double march in sitting with transition to march in standing but this was a bit overwhelming for him Alternating toe taps on 4 inch step progressing to 8 inch step with intermittent cues to focus on task at hand  Forward steps to target to promote improved step length, Mod multimodal cues  Light L shoulder stretching for flexion to tolerance   07/26/23 Getting in and out of car with him due to rain, he struggled due to a lot going on and it seemed to fluster him some Nustep level 5 x 7 minutes UBE level 3 x 6 minutes Supine bridges Partial sit ups Standing ball toss and volleyball some cues for left hand Gait with HHA x 3 laps Practice with him and wife getting in and out of bed, x3  07/21/23 Gait outside with HHA on the right negotiating curbs, cues for step length and posture, around to the front door,  Nustep level 5 x 7 minutes 2.5# LAQ 2.5# [employer] with wife regarding renewal and what is going on with Nadine Counts, she reports that in the morning he freezes when trying to go to the bathroom, and cannot sit, she also reports that she is using Max A to get him from supine to sit.  We practiced this with Nadine Counts in the bathroom and today he had no big issues of freezing. I started some education with wife on some physics of levers and fulcrums of trying to get him out of bed from supine Pball press for core work Retested [place] and 5XSTS  07/14/23 Nustep level 5 x 7 minutes UBE level 2 x 4 minutes Passive stretch hand, fingers, wrist, elbow and shoulder left Standing step turn reach touch Standing reaching for numbers on the wall with left  hand and right Sit to stand with cues for both arms to push up. Gait outside with HHA 200 feet then rest and then 200 feet through some grass and uneven terrain to the car  07/07/23 Nustep L5 x 8 min for neural priming Sit<>stand with hands on fitter poles x15 Sitting and forward reach for lumbar mobility x10 Sitting hands on the side (L hand on ball to keep palm as open as possible due to inability to fully extend fingers and wrist to keep his hand flat on mat table) with scap squeeze/chest up to ceiling for trunk mobility x10 Sitting and side reach and then rotation for trunk mobility and UE ROM 2x10 Standing big step forward with hands on fitter poles 2x10 Standing big side step L&R with hands on fitter poles 2x10 Supine side step 2x10 L&R Supine butterfly stretch within tolerable range Supine LTR x10 Supine UE reach towards ceilings for trunk and UE ROM x10 Supine side reach towards walls for trunk and UE ROM x10 pt notes this hurt more Gentle PROM of L elbow and shoulder in  supine Amb x3 laps with CGA, holding on pt's L hand to keep him from guarding/holding L UE in spastic flexion  06/28/23 Nustep level 4 x 7 minutes PT passive stretch of the left fingers, left wrist, left elbow and left shoulder Some AAROM of the left elbow and shoulder Some STM to the left upper trap and left forearm Heat on the back while doing the above UBE level 3 x 6 minutes Seated ball toss Standing ball toss and ball kicks  PATIENT EDUCATION: Education details: none new 02/16/23 Person educated: Patient and Spouse Education method: Explanation, Demonstration, Tactile cues, and Verbal cues Education comprehension: verbalized understanding, returned demonstration, verbal cues required, tactile cues required, and needs further education   ------------------------------------------------------------------------------------------------------- (Measures in this section from initial evaluation unless otherwise  noted) DIAGNOSTIC FINDINGS:  MRI 2021 with degenerative changes in lumbar spine, L3-4 R subarticular stenosis COGNITION: Overall cognitive status: History of cognitive impairments - at baseline SENSATION: Not tested COORDINATION: Moderately impaired BLE MUSCLE LENGTH: Hamstrings: severely restricted B Thomas test: Severely restricted B. POSTURE: rounded shoulders, forward head, increased thoracic kyphosis, posterior pelvic tilt, and flexed trunk  LOWER EXTREMITY ROM:   BLE extremely stiff throughout, limited hip ROM in all planes, B knees limited in extension. LOWER EXTREMITY MMT:  3+/5 throughout. Unable to determine accuracy of MMT due to cognition. Functionally demosntrates poor coordinated activation and poor muscular endurance. BED MOBILITY: Min A for Supine<> Sit. Patient was using a ladder in his hospital bed and could move MI. TRANSFERS: Min A, mod VC, tends to lean back.  11/02/22 independent with use of hands on arm chair with a couple of tries CURB: Min A-Has one step out to his deck. STAIRS: N/A  GAIT: Gait pattern: step to pattern, decreased arm swing- Right, decreased arm swing- Left, decreased step length- Right, decreased step length- Left, shuffling, festinating, trunk flexed, narrow BOS, poor foot clearance- Right, and poor foot clearance- Left Distance walked: 18' Assistive device utilized: Environmental consultant - 2 wheeled Level of assistance: Occasional min A Comments: mod VC to stay close to walker, take longer steps. FUNCTIONAL TESTS:  5 times sit to stand: 38.23  09/29/22  could not performs 5XSTS, 11/02/22 = 47 seconds with cues to use arms, 07/21/23 really struggled today 55 seconds Timed up and go (TUG): 47.91  with RW, 07/21/22 TUG 29  seconds without device, 09/29/22 25 seconds, 11/02/22 TUG 20 seconds, 07/21/23 = 26 seconds no device SBA                                                                     PATIENT EDUCATION: Education details: Progress towards goals, POC Person  educated: Patient and Spouse Education method: Programmer, multimedia, Facilities manager, and Verbal cues  Education comprehension: verbalized understanding, returned demonstration, verbal cues required, and needs further education  HOME EXERCISE PROGRAM: PWR! moves  GOALS: Goals reviewed with patient? Yes  SHORT TERM GOALS: Target date: 12/22/22  Perform HEP for PWR! Moves with cues from family. Baseline: Pt's wife, [name], notes walking and hitting beach ball as main HEP. Goal status: GOAL MET, 12/20/2022   2. Decrease 5 x STS to < 35 sec to demonstrate improved functional strength  Baseline: 47 seconds on 5/21 per note; 34.5 sec UE support 12/20/2022  Goal status: GOAL MET, 12/20/2022   3. Perform sit <> stand transfers from average chair height with Supervision.  Baseline: CGA  12/20/2022; (01/10/23) CGA  Goal status: NOT MET  LONG TERM GOALS: Target date: 04/22/23  Patient will perform HEP progression with caregiver assist. Baseline: has minimal HEP Goal status: ONGOING  2.  Decrease 5 x STS to < 25 sec to demonstrate improved functional strength Baseline: 47 seconds on 5/21 per note; (01/10/23) 36 sec w/ cues for speed/sequence>30.38 sec with BUE supprot Goal status: PARTIALLY MET, 01/24/2023  3. Improve quality of gait to be able to ambulate x 100 feet with supervision. Baseline: CGA with cues  Goal status: Met 08/18/23  4.  Patient will perform his bed mobility with MI including rolling, sit<>supine, and scooting in bed. Baseline: on hard surface mat he is independent but on soft bed at home he struggles, wife reports only needs a hand to get up now 11/02/22; (01/10/23) modified indep.  Goal status: MET  5.  Patient will demonstrate floor<>stand with CGA and verbal cues for patient's wife to be able to assist. Baseline:  Has to call 9-1-1; +2 max assist floor>stand with UE support 01/19/2023 Goal status: GOAL NOT MET 01/19/2023  **UPDATED GOALS BELOW ** per recert 05/17/2023  SHORT TERM GOALS:  Target date: 02/25/2023   Pt will perform HEP to maintain lower body strength, flexibility, posture with wife's assistance. Baseline:  Goal status: met 04/12/23  2. Pt will perform sit to stand, 8 of 10 reps with BUE support, with supervision, to maintain safety with transfers.  Baseline: min guard  Goal status: 02/07/23-CGA from elevated surface and max VC to lean forward, ongoing   LONG TERM GOALS: Target date: 07/18/23   Patient will perform HEP progression to maintain lower body strength, flexibility, posture with wife's assistance. Baseline:  Goal status: ongoing but progressing 08/25/23  2.  Pt will ambulate 150-175 ft in 2 MWT with min guard assist, to preserve functional mobility within the home.  Baseline: Goal status:  met 08/25/23  3. Wife will verbalize tips for fall prevention education to minimize fall frequency Baseline:  Goal status:progressing and doing well 4.  Patient will be able to toilet without freezing for over a 4 week period 5.  Patient will be able to get out of bed from supine with wife assist minimal Assist, will see him at his house next week 08/25/23   ASSESSMENT:  CLINICAL IMPRESSION:   Continues to work on reciprocal movement patterns to help with Parkinson's.  Also worked some on core work,he is having some c/o back pain today.  I did work on some bed mobility with him and again on our hard mat tables he can get up with light min A, we are planning to do a home visit next week to see what it is like in his bed as his wife reports she has to do max A  OBJECTIVE IMPAIRMENTS: Abnormal gait, decreased activity tolerance, decreased balance, decreased cognition, decreased coordination, decreased endurance, decreased mobility, difficulty walking, decreased ROM, decreased strength, decreased safety awareness, impaired flexibility, impaired UE functional use, improper body mechanics, and postural dysfunction.   PLAN:  PT FREQUENCY: 1x/week  PT DURATION: 12  weeks  PLANNED INTERVENTIONS: Therapeutic exercises, Therapeutic activity, Neuromuscular re-education, Balance training, Gait training, Patient/Family education, Self Care, Joint mobilization, Stair training, Moist heat, and Manual therapy  PLAN FOR NEXT SESSION:   Will see I'm next week at his house and assess the actual bed mobility  in his bed and give wife tips on how to make things easier on her back, will also look at toilet transfers and how that is going as the wife has reported he has been getting stuck Patient Details  Name: KELLYN MANSFIELD MRN: [id number] Date of Birth: 09-May-1941 Referring Provider:  [name], [name], *  Stacie Glaze, PT

## 2023-08-27 ENCOUNTER — Other Ambulatory Visit (HOSPITAL_BASED_OUTPATIENT_CLINIC_OR_DEPARTMENT_OTHER): Payer: Self-pay

## 2023-08-29 ENCOUNTER — Other Ambulatory Visit (HOSPITAL_BASED_OUTPATIENT_CLINIC_OR_DEPARTMENT_OTHER): Payer: Self-pay

## 2023-08-31 ENCOUNTER — Other Ambulatory Visit: Payer: Self-pay | Admitting: Family Medicine

## 2023-08-31 ENCOUNTER — Other Ambulatory Visit: Payer: Self-pay

## 2023-08-31 ENCOUNTER — Other Ambulatory Visit (HOSPITAL_BASED_OUTPATIENT_CLINIC_OR_DEPARTMENT_OTHER): Payer: Self-pay

## 2023-08-31 MED ORDER — HYDRALAZINE HCL 25 MG PO TABS
25.0000 mg | ORAL_TABLET | Freq: Three times a day (TID) | ORAL | 0 refills | Status: DC
  Filled 2023-08-31: qty 270, 90d supply, fill #0

## 2023-08-31 NOTE — Progress Notes (Unsigned)
 NEUROLOGY FOLLOW UP OFFICE NOTE  William [name] [id number]  Assessment/Plan:     1.  Parkinson's disease 2.   Major vascular neurocognitive disorder, but cannot rule out underlying dementia secondary to Parkinson's disease as well 3.   Transient ischemic attack    Increase carbidopa-levodopa IR 25/100mg  to 1.5 tablest three times daily Continue home exercises and maintenance PT once a week Follow up in 6 months.   Subjective:  William [name] is a 83 year old man with dementia, CKD, HTN, CAD, HLD and history of CVA who follows up for vascular dementia, cerebrovascular disease with history of stroke and parkinsonism.  UPDATE: Current medications:  Carbidopa-levodopa IR 25/100mg  1.5 tablet three times daily (9-10 AM, 12-2 PM and 5-6 PM), ASA 81mg , Plavix 75mg ; Norvasc, hydralazine, fenofibrate 160mg , Tegretol-XR 100mg  at bedtime; amantadine 100mg  daily, lorazepam PRN; tizanidine 4mg  Q6h PRN, trazodone 75mg  at bedtime  Resides at [facility].  On 04/19/2023, he was admitted to [facility] with slurred speech, dizziness, diaphoresis and appeared to be leaning to the right.  Exam in hospital revealed chronic left sided facial droop but no new focal abnormalities.  Found to have a UTI, which has been cause for confusion in the past and given dose of IV Rocephin and discharged on Keflex.  MRI of brain showed known multiple old infarcts in the right MCA territory but was negative for acute stroke or other acute abnormality.  EEG showed right frontotemporal slowing secondary to prior stroke but no epileptiform discharges or electrographic seizures.  B12 level was low at 203 and started on supplementation.  He had another episode of confusion due to refractory UTI on 11/16.  He returned to the ED on 11/26 for episode of syncope.  EKG was unremarkable and he was given IV fluids and referred to cardiology.  ***   HISTORY: 1.  Lumbar spondylosis with radiculopathy Prior to COVID, he was going to the  gym and seeing a personal trainer but it stopped once the pandemic hit.  Since the pandemic, he has been sedentary.  He walks the dog once or twice a week.  He started having bilateral leg pain later that year in 2020.  No associated back pain.  No shooting pain.  It is described as an aching in his calves.  No associated numbness.  He also reports weakness.  When he walks, he has trouble picking up his legs.  He seems to shuffle his feet.  When he gets up to stand, he feels like he can fall forward.  He had one fall while standing at the toilet and he bent forward to spit in toilet.  No change in bowel habit.  He was having urinary issues so he was taken off of Flomax.    Vascular US ABI from 12/08/18 showed abnormal toe-brachial index in both lower extremities but otherwise unremarkable.  MRI of cervical spine without contrast from 03/10/2019 showed some cervical degenerative disc disease and spondylosis with mild foraminal narrowing but no spinal stenosis or compressive myelopathy.  He does have history of low back pain and lumbar spondylosis.  Due to ongoing unsteady gait with bilateral leg pain, MRI of lumbar spine was performed on 11/14/2019, which was personally reviewed and demonstrated degenerative changes stable compared to 2012 with L3-4 right subarticular stenosis.  He has had mixed response to epidural injections.     2.  Vascular Dementia: He had a stroke in 2006, after experiencing left facial droop, left arm and leg weakness, as  well slurred vision and vision problems.  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  He has some residual left sided weakness and facial weakness.  He underwent left carotid endarterectomy in 2010 for asymptomatic stenosis.   Beginning around 2013, he and his wife have noted a gradual progression and cognitive deficits. Onset correlated during a period when he was experiencing dizziness and syncope secondary to orthostatic hypotension.  At that time, he was dehydrated and not drinking much fluids. He was instructed to start increasing his fluid intake as well as his sodium intake. Now, he tries to drink 3 bottles of water per day. He particularly notes confusion regarding how to perform certain everyday tasks. For example, it takes him a long time to unload the dishwasher because he has trouble figuring out where to put the dishes. He use to enjoy cooking and preparing meals. Now he has difficulty cooking and can only see things up in the microwave. He also has been experiencing dressing apraxia. Sometimes he will put both his feet into one leg of his pants. Other times, he has difficulty buttoning his shirts. He denies any language dysfunction such as difficulty understanding other people or other people not understanding him. He has no trouble reading or writing. His short-term and long-term memory are pretty good. He denies any difficulty with face recognition. He denies hallucinations or delusions. He has not had any change in his personality or behavior. He still drives but very seldom and only locally. He has not had any problems with accidents or near accidents, but he says he drives slowly because he is very nervous and cautious. He does have history of anxiety and often shakes when he gets nervous. He denies any family history of dementia. However, there is a psychiatric history with his mother and sister.  He denies history of alcohol abuse or drug use.   He underwent neuropsychological testing on 10/25/13 with Dr. Elvis Coil at [facility].  Testing suggested mild dementia due to his stroke, but also underlying neurodegenerative process.  Memory and left hemispheric medial temporal functions were intact.  Findings showed deficits in visual processing and executive dysfunction.  Onset of visual-spatial deficits and apraxia may suggest occipital lobe involvement, such as due to an Alzheimer's variant called posterior cortical atrophy.   MRI from May 2015 revealed global atrophy but it does not seem more prominent in the occipital lobes.  He developed increased confusion in February 2021.  At physical therapy, he was noted to have increased difficulty using his left side (affected side from his stroke).  He had difficulty performing finger-thumb tapping and had trouble gripping the handle of the exercise machine with his left hand.  He also has had acute cognitive changes.  He started having trouble dialing his daughter's phone number which is new.  MRI of brain without contrast performed on 08/15/2019 was personally reviewed and showed remote large right hemispheric stroke and chronic small vessel ischemic changes, stable compared to prior MRI from 2015, no acute intracranial abnormality.   He underwent repeat neuropsychological testing on 11/29/2019 which demonstrated findings primarily consistent with major vascular neurocognitive disorder, relatively stable compared to prior testing in 2015.  Underlying neurodegenerative disorder could not be ruled out but clinically has been stable.    In February 2023, he became more confused.  Found to have UTI as well as acute infarct in left basal ganglia and corona radiata as well as severe distal left M1 MCA stenosis and  moderate right ICA stenosis.  Discharged on ASA and Plavix for 3 months followed by Plavix monotherapy.  Following discharge, continued to be confused.  Mistakes wife with his ex-wife.  Incontinent.  Baseline hemineglect worse.    DaT scan on 08/05/2022 was equivocal - revealed marked decreased activity in the left striatum, however it correlates with large left basal ganglia infarct as seen on comparison CT.   Prior medication:  Aricept (diarrhea, vivid dreams); galantamine (nausea)   3.  Headache: In February 2022, he reported new moderate non-throbbing headache back of head every night.  Ongoing for two months.  No neck pain.  It occurs in the evening while sitting talking or  watching TV.  Treats with Tylenol almost everyday.  Lasts a half hour.  No change in medication just prior to onset.  Similar headache when he had his stroke.  MRI of brain without contrast on 08/14/2020 showed no new acute/subacute intracranial abnormality.  Headaches resolved.     01/30/20 Carotid doppler:  1-39% bilateral ICA stenosis. 10/16/13 LABS:  B12 720, TSH 1.668 10/19/13 MRI BRAIN WO:  stable remote large right MCA territory infarct.  Chronic microvascular ischemic changes. 05/21/13 LABS:  Hgb A1c 6 05/17/13 Carotid doppler: bilateral 1-39% stenosis of the right proximal ICA and no stenosis of the left ICA. 03/11/12 MRI Brain wo contrast:  Remote large right MCA infarct. Dementia with behavioral symptoms.  Etiology unclear.  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   HTN (hypertension) 07/19/2015   Hyperlipidemia    Major neurocognitive disorder 01/09/2014   Mild, related to stroke history   Nocturia    Renal insufficiency 06/25/2013   S/P carotid endarterectomy    BILATERAL ICA--  PATENT PER DUPLEX  05-19-2012   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     amantadine (SYMMETREL) 100 MG capsule Take 1 capsule (100 mg total) by mouth daily. 90 capsule 1   amLODipine (NORVASC) 10 MG tablet Take 1 tablet (10 mg total) by mouth every morning. 90 tablet 1   bacitracin ointment Apply 1 Application topically 2 (two) times daily. 120 g 0   carBAMazepine (TEGRETOL) 100 MG/5ML suspension Take 20 mLs (400 mg total) by mouth daily. Take 5 mLs every morning, take 5 mLs at noon and 10 mLs at bedtime. 600 mL 3   carbidopa-levodopa (SINEMET IR) 25-100 MG tablet Take 1.5 tablets by mouth 3 (three) times daily. 165 tablet 5   Cholecalciferol (VITAMIN D3) 50 MCG (2000 UT) CHEW Chew 1 tablet by mouth daily.  clobetasol cream (TEMOVATE) 0.05 % Apply 1 Application to affected areas topically up to 2 (two) times daily as needed. Do not apply to face, groin, or under arms 60 g 3   clopidogrel (PLAVIX) 75 MG tablet Take 1 tablet (75 mg total) by mouth daily. 90 tablet 0   cyanocobalamin (VITAMIN B12) 1000 MCG tablet Take 1 tablet (1,000 mcg total) by mouth daily. 100 tablet 2   ferrous sulfate 325 (65 FE) MG EC tablet Take 325 mg by mouth 2 (two) times daily. *Crushed*     fluticasone (CUTIVATE) 0.05 % cream 1 application  at bedtime as needed (psoriasis).  3   hydrALAZINE (APRESOLINE) 25 MG tablet Take 1 tablet (25 mg total) by mouth 3 (three) times daily. 270 tablet 0   LORazepam (ATIVAN) 1 MG tablet Take 1/4 tablet by mouth in the morning, 1/2 tablet by mouth at lunch and 1 tablet by mouth at bedtime 60 tablet 2   LORazepam (ATIVAN) 1 MG tablet Take 1/4 tablet (0.25 mg total) by mouth in the morning AND 1/2 tablet (0.5 mg total) daily in the afternoon AND 1 tablet (1 mg total) at bedtime. 60 tablet 0   LORazepam (ATIVAN) 1 MG tablet Take 1/4th (0.25 mg) tablet in the morning, 1/2 (half) tablet at 2pm and 1 (1mg ) tablet by mouth at bedtime. 60 tablet 4   Multiple Vitamins-Minerals (ADULT ONE DAILY GUMMIES) CHEW Chew 1  tablet by mouth every morning.     NONFORMULARY OR COMPOUNDED ITEM Transport chair  dx parkinsons, hx falls 1 each 0   pantoprazole (PROTONIX) 40 MG tablet Take 1 tablet (40 mg total) by mouth daily. 90 tablet 3   polyethylene glycol (MIRALAX / GLYCOLAX) 17 g packet Take 17 g by mouth daily. (Patient taking differently: Take 17 g by mouth every other day.) 14 each 0   tamsulosin (FLOMAX) 0.4 MG CAPS capsule Take 1 capsule (0.4 mg total) by mouth daily after supper. (Patient taking differently: Take 0.4 mg by mouth at bedtime.) 90 capsule 1   tamsulosin (FLOMAX) 0.4 MG CAPS capsule Take 1 capsule (0.4 mg total) by mouth at bedtime. 90 capsule 3   traZODone (DESYREL) 150 MG tablet Take 0.5 tablets (75 mg total) by mouth at bedtime. *Crushed* 20 tablet 6   triamcinolone cream (KENALOG) 0.1 % Apply 1 application topically 2 (two) times daily. 30 g 0   Current Facility-Administered Medications on File Prior to Visit  Medication Dose Route Frequency Provider Last Rate Last Admin   gemcitabine (GEMZAR) chemo syringe for bladder instillation 2,000 mg  2,000 mg Bladder Instillation Once Marcine Matar, MD        ALLERGIES: Allergies  Allergen Reactions   Bee Venom Anaphylaxis   Strawberry Extract Hives   Latex Itching   Zetia [Ezetimibe] Other (See Comments)    Intolerance    Adhesive [Tape] Other (See Comments)    blisters   Statins Other (See Comments)    myalgias    FAMILY HISTORY: Family History  Problem Relation Age of Onset   Heart disease Mother        CHF   Bipolar disorder Mother    Heart disease Father        CHF      Objective:  Blood pressure 132/82, pulse 74, resp. rate 20, height 5\' 9"  (1.753 m), SpO2 99%. General: No acute distress.  Patient appears frail  Head:  Normocephalic/atraumatic Eyes:  Fundi examined but not visualized Neck: supple, no paraspinal tenderness, full  range of motion Heart:  Regular rate and rhythm Neurological Exam:  Alert and oriented.  Hypomimia.  Limited tracking with eye movements.  Hypophonia.  Increased tone in wrists and elbows, particularly left upper extremity.  Muscle strength 3/5 left upper extremity, otherwise 4/5 throughout.  Resting tremor in hands.  Sensation to light touch intact.  Deep tendon reflexes 3+ throughout.  Pushed up from wheelchair to stand.  No freezing.  Quick gait with short stride.  En bloc turn.  Reduced left arm swing.     Shon Millet, DO  CC: Seabron Spates, DO

## 2023-09-01 ENCOUNTER — Telehealth: Payer: Self-pay | Admitting: Family Medicine

## 2023-09-01 ENCOUNTER — Encounter: Payer: Self-pay | Admitting: Neurology

## 2023-09-01 ENCOUNTER — Ambulatory Visit: Payer: PPO | Admitting: Neurology

## 2023-09-01 ENCOUNTER — Ambulatory Visit (INDEPENDENT_AMBULATORY_CARE_PROVIDER_SITE_OTHER): Admitting: Physician Assistant

## 2023-09-01 ENCOUNTER — Other Ambulatory Visit (HOSPITAL_BASED_OUTPATIENT_CLINIC_OR_DEPARTMENT_OTHER): Payer: Self-pay

## 2023-09-01 ENCOUNTER — Ambulatory Visit: Payer: Self-pay

## 2023-09-01 ENCOUNTER — Ambulatory Visit: Payer: PPO | Admitting: Physical Therapy

## 2023-09-01 ENCOUNTER — Encounter: Payer: Self-pay | Admitting: Physician Assistant

## 2023-09-01 VITALS — BP 163/67 | HR 98 | Resp 18 | Ht 69.0 in

## 2023-09-01 VITALS — BP 147/77 | HR 63 | Temp 97.9°F

## 2023-09-01 DIAGNOSIS — Z8673 Personal history of transient ischemic attack (TIA), and cerebral infarction without residual deficits: Secondary | ICD-10-CM

## 2023-09-01 DIAGNOSIS — N1832 Chronic kidney disease, stage 3b: Secondary | ICD-10-CM

## 2023-09-01 DIAGNOSIS — R300 Dysuria: Secondary | ICD-10-CM

## 2023-09-01 DIAGNOSIS — G20A1 Parkinson's disease without dyskinesia, without mention of fluctuations: Secondary | ICD-10-CM

## 2023-09-01 DIAGNOSIS — F0280 Dementia in other diseases classified elsewhere without behavioral disturbance: Secondary | ICD-10-CM

## 2023-09-01 MED ORDER — CARBIDOPA-LEVODOPA 25-100 MG PO TABS
1.5000 | ORAL_TABLET | Freq: Three times a day (TID) | ORAL | 5 refills | Status: DC
  Filled 2023-09-01: qty 165, 37d supply, fill #0
  Filled 2023-10-05: qty 165, 37d supply, fill #1
  Filled 2023-11-13 – 2023-11-14 (×2): qty 165, 37d supply, fill #2
  Filled 2023-12-12: qty 165, 37d supply, fill #3
  Filled 2024-01-26: qty 165, 37d supply, fill #4
  Filled 2024-03-02: qty 165, 37d supply, fill #5

## 2023-09-01 MED ORDER — CEPHALEXIN 250 MG PO CAPS
250.0000 mg | ORAL_CAPSULE | Freq: Two times a day (BID) | ORAL | 0 refills | Status: AC
  Filled 2023-09-01: qty 14, 7d supply, fill #0

## 2023-09-01 NOTE — Progress Notes (Signed)
 Established patient visit   Patient: William [name]   DOB: 1940/10/27   83 y.o. Male  MRN: [id number] Visit Date: 09/01/2023  Today's healthcare provider: Alfredia Ferguson, PA-C  Cc. Dysuria, odor  Subjective     Pt with a pmh of CKD, bladder cancer, reports dysuria, urinary odor x 3 days , some lower abdominal pain. Denies low back pain, body aches, fevers. Unable to urinate in office today.  Medications: Outpatient Medications Prior to Visit  Medication Sig   acetaminophen (TYLENOL) 325 MG tablet Take 2 tablets (650 mg total) by mouth every 6 (six) hours as needed for mild pain.   amantadine (SYMMETREL) 100 MG capsule Take 1 capsule (100 mg total) by mouth daily.   amLODipine (NORVASC) 10 MG tablet Take 1 tablet (10 mg total) by mouth every morning.   bacitracin ointment Apply 1 Application topically 2 (two) times daily.   carBAMazepine (TEGRETOL) 100 MG/5ML suspension Take 20 mLs (400 mg total) by mouth daily. Take 5 mLs every morning, take 5 mLs at noon and 10 mLs at bedtime.   carbidopa-levodopa (SINEMET IR) 25-100 MG tablet Take 1.5 tablets by mouth 3 (three) times daily.   Cholecalciferol (VITAMIN D3) 50 MCG (2000 UT) CHEW Chew 1 tablet by mouth daily.   clobetasol cream (TEMOVATE) 0.05 % Apply 1 Application to affected areas topically up to 2 (two) times daily as needed. Do not apply to face, groin, or under arms (Patient not taking: Reported on 09/01/2023)   clopidogrel (PLAVIX) 75 MG tablet Take 1 tablet (75 mg total) by mouth daily.   cyanocobalamin (VITAMIN B12) 1000 MCG tablet Take 1 tablet (1,000 mcg total) by mouth daily.   ferrous sulfate 325 (65 FE) MG EC tablet Take 325 mg by mouth 2 (two) times daily. *Crushed*   fluticasone (CUTIVATE) 0.05 % cream 1 application  at bedtime as needed (psoriasis).   hydrALAZINE (APRESOLINE) 25 MG tablet Take 1 tablet (25 mg total) by mouth 3 (three) times daily.   LORazepam (ATIVAN) 1 MG tablet Take 1/4 tablet by mouth in the  morning, 1/2 tablet by mouth at lunch and 1 tablet by mouth at bedtime   LORazepam (ATIVAN) 1 MG tablet Take 1/4 tablet (0.25 mg total) by mouth in the morning AND 1/2 tablet (0.5 mg total) daily in the afternoon AND 1 tablet (1 mg total) at bedtime.   LORazepam (ATIVAN) 1 MG tablet Take 1/4th (0.25 mg) tablet in the morning, 1/2 (half) tablet at 2pm and 1 (1mg ) tablet by mouth at bedtime.   Multiple Vitamins-Minerals (ADULT ONE DAILY GUMMIES) CHEW Chew 1 tablet by mouth every morning.   NONFORMULARY OR COMPOUNDED ITEM Transport chair  dx parkinsons, hx falls   pantoprazole (PROTONIX) 40 MG tablet Take 1 tablet (40 mg total) by mouth daily.   polyethylene glycol (MIRALAX / GLYCOLAX) 17 g packet Take 17 g by mouth daily. (Patient taking differently: Take 17 g by mouth every other day.)   tamsulosin (FLOMAX) 0.4 MG CAPS capsule Take 1 capsule (0.4 mg total) by mouth daily after supper. (Patient taking differently: Take 0.4 mg by mouth at bedtime.)   tamsulosin (FLOMAX) 0.4 MG CAPS capsule Take 1 capsule (0.4 mg total) by mouth at bedtime.   traZODone (DESYREL) 150 MG tablet Take 0.5 tablets (75 mg total) by mouth at bedtime. *Crushed*   triamcinolone cream (KENALOG) 0.1 % Apply 1 application topically 2 (two) times daily.   Facility-Administered Medications Prior to Visit  Medication Dose  Route Frequency Provider   gemcitabine (GEMZAR) chemo syringe for bladder instillation 2,000 mg  2,000 mg Bladder Instillation Once Marcine Matar, MD    Review of Systems  Constitutional:  Negative for fatigue and fever.  Respiratory:  Negative for cough and shortness of breath.   Cardiovascular:  Negative for chest pain, palpitations and leg swelling.  Genitourinary:  Positive for dysuria.  Neurological:  Negative for dizziness and headaches.       Objective    BP (!) 147/77   Pulse 63   Temp 97.9 F (36.6 C)    Physical Exam Constitutional:      General: He is awake.     Appearance: He is  well-developed.  HENT:     Head: Normocephalic.  Eyes:     Conjunctiva/sclera: Conjunctivae normal.  Cardiovascular:     Rate and Rhythm: Normal rate and regular rhythm.     Heart sounds: Normal heart sounds.  Pulmonary:     Effort: Pulmonary effort is normal.     Breath sounds: Normal breath sounds.  Abdominal:     Palpations: Abdomen is soft.     Tenderness: There is no abdominal tenderness.  Skin:    General: Skin is warm.  Neurological:     Mental Status: He is alert and oriented to person, place, and time.  Psychiatric:        Attention and Perception: Attention normal.        Mood and Affect: Mood normal.        Speech: Speech normal.        Behavior: Behavior is cooperative.     No results found for any visits on 09/01/23.  Assessment & Plan    Dysuria -     CBC with Differential/Platelet -     Comprehensive metabolic panel -     Cephalexin; Take 1 capsule (250 mg total) by mouth 2 (two) times daily for 7 days.  Dispense: 14 capsule; Refill: 0 -     Urine Culture; Future  Chronic kidney disease, stage 3b (HCC) -     CBC with Differential/Platelet -     Comprehensive metabolic panel   Will tx empirically, keflex 250 mg bid. Check cbc/cmp Given sterile urine container if able to urinate at home and drop off for culture.  Return if symptoms worsen or fail to improve.       Alfredia Ferguson, PA-C  [facility] [phone] (phone) [phone] (fax)  [facility]

## 2023-09-01 NOTE — Telephone Encounter (Signed)
 Chief Complaint: Dysuria Symptoms: odor Frequency: x 3 days Pertinent Negatives: Patient denies fever, N/V Disposition: [] ED /[] Urgent Care (no appt availability in office) / [] Appointment(In office/virtual)/ []  [facility]/ [] Home Care/ [] Refused Recommended Disposition /[] [place] [facility]/ []  Follow-up with PCP Additional Notes: Pt's wife reports pt is incontinent and has history of UTI. Pt has been experiencing odor, dysuria x 3 days. Denies fever, worsening hematuria. OV scheduled today. This RN educated pt on home care, new-worsening symptoms, when to call back/seek emergent care. Pt verbalized understanding and agrees to plan.    Copied from CRM [id number]. Topic: Clinical - Red Word Triage >> Sep 01, 2023 11:59 AM Shelbie Proctor wrote: Red Word that prompted transfer to Nurse Triage: Patient's wife [name] [phone], patient has a bladder infection. Patient symptoms stomach pain, urine smell, burning sensation with urination, chills, and unsure if patient has a fever Patient denies nausea, vomiting, dizziness, nor headaches. Wants to be seen today. Reason for Disposition  Diabetes mellitus or weak immune system (e.g., HIV positive, cancer chemo, splenectomy, organ transplant, chronic steroids)  Answer Assessment - Initial Assessment Questions 1. SEVERITY: "How bad is the pain?"  (e.g., Scale 1-10; mild, moderate, or severe)   - MILD (1-3): Complains slightly about urination hurting.   - MODERATE (4-7): Interferes with normal activities.     - SEVERE (8-10): Excruciating, unwilling or unable to urinate because of the pain.      2/10 3. PATTERN: "Is pain present every time you urinate or just sometimes?"      Every time 4. ONSET: "When did the painful urination start?"      About 3 days ago 5. FEVER: "Do you have a fever?" If Yes, ask: "What is your temperature, how was it measured, and when did it start?"     None 6. PAST UTI: "Have you had a urine infection before?" If  Yes, ask: "When was the last time?" and "What happened that time?"      Yes 7. CAUSE: "What do you think is causing the painful urination?"      Possible UTI 8. OTHER SYMPTOMS: "Do you have any other symptoms?" (e.g., flank pain, penis discharge, scrotal pain, blood in urine)     Increased confusion at night, lower abd pain  Protocols used: Urination Pain - Male-A-AH

## 2023-09-01 NOTE — Patient Instructions (Signed)
 Take first dose of carbidopa-levodopa 30 minutes prior to getting up from bed to go to bathroom  Discuss with [name] about increasing Tegretol (2 PM dose)

## 2023-09-01 NOTE — Telephone Encounter (Signed)
 Pt's wife William [name]) dropped off document to be filled out by provider ( [facility] Examination report in a large yellow envelope) Pt's spouse would like to be called when document ready to pick up, Document put at front office tray under providers name. Pt tel [phone]

## 2023-09-01 NOTE — Telephone Encounter (Signed)
 Patient scheduled to see [facility]

## 2023-09-02 ENCOUNTER — Encounter: Payer: Self-pay | Admitting: Physician Assistant

## 2023-09-02 LAB — COMPREHENSIVE METABOLIC PANEL
ALT: 3 U/L (ref 0–53)
AST: 9 U/L (ref 0–37)
Albumin: 3.9 g/dL (ref 3.5–5.2)
Alkaline Phosphatase: 155 U/L — ABNORMAL HIGH (ref 39–117)
BUN: 28 mg/dL — ABNORMAL HIGH (ref 6–23)
CO2: 29 meq/L (ref 19–32)
Calcium: 8.9 mg/dL (ref 8.4–10.5)
Chloride: 102 meq/L (ref 96–112)
Creatinine, Ser: 1.81 mg/dL — ABNORMAL HIGH (ref 0.40–1.50)
GFR: 34.73 mL/min — ABNORMAL LOW (ref >60.00)
Glucose, Bld: 104 mg/dL — ABNORMAL HIGH (ref 70–99)
Potassium: 4.4 meq/L (ref 3.5–5.1)
Sodium: 141 meq/L (ref 135–145)
Total Bilirubin: 0.4 mg/dL (ref 0.2–1.2)
Total Protein: 6.8 g/dL (ref 6.0–8.3)

## 2023-09-02 LAB — CBC WITH DIFFERENTIAL/PLATELET
Basophils Absolute: 0 10*3/uL (ref 0.0–0.1)
Basophils Relative: 0.7 % (ref 0.0–3.0)
Eosinophils Absolute: 0.1 10*3/uL (ref 0.0–0.7)
Eosinophils Relative: 2.2 % (ref 0.0–5.0)
HCT: 35.2 % — ABNORMAL LOW (ref 39.0–52.0)
Hemoglobin: 12.1 g/dL — ABNORMAL LOW (ref 13.0–17.0)
Lymphocytes Relative: 19.7 % (ref 12.0–46.0)
Lymphs Abs: 1.2 10*3/uL (ref 0.7–4.0)
MCHC: 34.2 g/dL (ref 30.0–36.0)
MCV: 98.4 fl (ref 78.0–100.0)
Monocytes Absolute: 0.6 10*3/uL (ref 0.1–1.0)
Monocytes Relative: 9.6 % (ref 3.0–12.0)
Neutro Abs: 4.2 10*3/uL (ref 1.4–7.7)
Neutrophils Relative %: 67.8 % (ref 43.0–77.0)
Platelets: 208 10*3/uL (ref 150.0–400.0)
RBC: 3.58 Mil/uL — ABNORMAL LOW (ref 4.22–5.81)
RDW: 14.1 % (ref 11.5–15.5)
WBC: 6.3 10*3/uL (ref 4.0–10.5)

## 2023-09-02 NOTE — Telephone Encounter (Signed)
 Received! Placed in folder.

## 2023-09-04 ENCOUNTER — Other Ambulatory Visit: Payer: Self-pay | Admitting: Family Medicine

## 2023-09-05 ENCOUNTER — Other Ambulatory Visit (HOSPITAL_BASED_OUTPATIENT_CLINIC_OR_DEPARTMENT_OTHER): Payer: Self-pay

## 2023-09-05 MED ORDER — CLOPIDOGREL BISULFATE 75 MG PO TABS
75.0000 mg | ORAL_TABLET | Freq: Every day | ORAL | 0 refills | Status: DC
  Filled 2023-09-05: qty 90, 90d supply, fill #0

## 2023-09-06 ENCOUNTER — Encounter: Payer: Self-pay | Admitting: Physical Therapy

## 2023-09-06 ENCOUNTER — Ambulatory Visit: Payer: PPO | Admitting: Physical Therapy

## 2023-09-06 DIAGNOSIS — Z9181 History of falling: Secondary | ICD-10-CM

## 2023-09-06 DIAGNOSIS — I69354 Hemiplegia and hemiparesis following cerebral infarction affecting left non-dominant side: Secondary | ICD-10-CM

## 2023-09-06 DIAGNOSIS — R262 Difficulty in walking, not elsewhere classified: Secondary | ICD-10-CM

## 2023-09-06 DIAGNOSIS — R2689 Other abnormalities of gait and mobility: Secondary | ICD-10-CM

## 2023-09-06 DIAGNOSIS — M6281 Muscle weakness (generalized): Secondary | ICD-10-CM | POA: Diagnosis not present

## 2023-09-06 DIAGNOSIS — M25512 Pain in left shoulder: Secondary | ICD-10-CM

## 2023-09-06 DIAGNOSIS — R296 Repeated falls: Secondary | ICD-10-CM

## 2023-09-06 NOTE — Therapy (Signed)
 OUTPATIENT PHYSICAL THERAPY NEURO TREATMENT  Patient Name: William [name] MRN: [id number] DOB:[birth year], 83 y.o., male Today's Date: 09/06/2023  PCP: Donato Schultz  DO REFERRING PROVIDER: same   END OF SESSION:  PT End of Session - 09/06/23 1308     Visit Number [id number]    Date for PT Re-Evaluation 09/18/23    Authorization Type HTA    PT Start Time 1305    PT Stop Time 1352    PT Time Calculation (min) 47 min    Equipment Utilized During Treatment Gait belt    Activity Tolerance Patient tolerated treatment well    Behavior During Therapy WFL for tasks assessed/performed                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   HTN (hypertension) 07/19/2015   Hyperlipidemia    Major neurocognitive disorder 01/09/2014   Mild, related to stroke history   Nocturia    Renal insufficiency 06/25/2013   S/P carotid endarterectomy    BILATERAL ICA--  PATENT PER DUPLEX  05-19-2012   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/26/2017   Right calf   Urinary frequency    Vitamin D deficiency    Past Surgical History:  Procedure Laterality Date   APPENDECTOMY  AS CHILD   CARDIOVASCULAR STRESS TEST  03-27-2012  DR CRENSHAW   LOW RISK LEXISCAN STUDY-- PROBABLE NORMAL PERFUSION AND SOFT TISSUE ATTENUATION/  NO ISCHEMIA/ EF 51%   CAROTID ENDARTERECTOMY Bilateral LEFT  11-12-2008  DR GREG HAYES   RIGHT ICA  2006  ([facility])   CHOLECYSTECTOMY N/A 02/23/2022   Procedure: LAPAROSCOPIC CHOLECYSTECTOMY;  Surgeon: Quentin Ore, MD;  Location: [facility] OR;  Service:  General;  Laterality: N/A;   CYSTOSCOPY W/ RETROGRADES Bilateral 06/22/2021   Procedure: CYSTOSCOPY WITH RETROGRADE PYELOGRAM;  Surgeon: Marcine Matar, MD;  Location: [facility];  Service: Urology;  Laterality: Bilateral;   CYSTOSCOPY WITH LITHOLAPAXY N/A 02/26/2013   Procedure: CYSTOSCOPY WITH LITHOLAPAXY;  Surgeon: Marcine Matar, MD;  Location: [facility];  Service: Urology;  Laterality: N/A;   ENDOSCOPIC RETROGRADE CHOLANGIOPANCREATOGRAPHY (ERCP) WITH PROPOFOL N/A 02/22/2022   Procedure: ENDOSCOPIC RETROGRADE CHOLANGIOPANCREATOGRAPHY (ERCP) WITH PROPOFOL;  Surgeon: Jeani Hawking, MD;  Location: [facility] ENDOSCOPY;  Service: Gastroenterology;  Laterality: N/A;   EYE SURGERY  Jan. 2016   cataract surgery both eyes   INGUINAL HERNIA REPAIR Right 11-08-2006   IR KYPHO EA ADDL LEVEL THORACIC OR LUMBAR  02/12/2021   IR RADIOLOGIST EVAL & MGMT  02/18/2021   MASS EXCISION N/A 03/03/2016   Procedure: EXCISION OF BACK  MASS;  Surgeon: Almond Lint, MD;  Location: [facility];  Service: General;  Laterality: N/A;   MOHS SURGERY Left 1/ 2016   Dr Margo Aye-- Basal cell   PROSTATE SURGERY     REMOVAL OF STONES  02/22/2022   Procedure: REMOVAL OF STONES;  Surgeon: Jeani Hawking, MD;  Location: [facility] ENDOSCOPY;  Service: Gastroenterology;;   Dennison Mascot  02/22/2022  Procedure: SPHINCTEROTOMY;  Surgeon: Jeani Hawking, MD;  Location: [facility] ENDOSCOPY;  Service: Gastroenterology;;   TRANSURETHRAL RESECTION OF BLADDER TUMOR WITH MITOMYCIN-C N/A 06/22/2021   Procedure: TRANSURETHRAL RESECTION OF BLADDER TUMOR;  Surgeon: Marcine Matar, MD;  Location: [facility];  Service: Urology;  Laterality: N/A;   TRANSURETHRAL RESECTION OF PROSTATE N/A 02/26/2013   Procedure: TRANSURETHRAL RESECTION OF THE PROSTATE WITH GYRUS INSTRUMENTS;  Surgeon: Marcine Matar, MD;  Location: [facility];  Service: Urology;  Laterality: N/A;   TRANSURETHRAL RESECTION OF  PROSTATE N/A 06/22/2021   Procedure: TRANSURETHRAL RESECTION OF THE PROSTATE (TURP);  Surgeon: Marcine Matar, MD;  Location: [facility];  Service: Urology;  Laterality: N/A;   Patient Active Problem List   Diagnosis Date Noted   Bronchitis 06/16/2023   Impacted cerumen of left ear 06/16/2023   Acute encephalopathy 04/18/2023   Parkinson's disease (HCC) 04/18/2023   History of CVA with residual deficit 04/18/2023   Chronic kidney disease, stage 3b (HCC) 04/18/2023   Dementia with behavioral disturbance (HCC) 04/18/2023   Anemia of chronic disease 04/18/2023   Laceration of right upper extremity 02/24/2023   Dermatitis 02/24/2023   Lung nodules 02/24/2023   Dysuria 08/03/2022   Nonintractable headache 07/01/2022   Bilateral impacted cerumen 06/24/2022   Rash 06/15/2022   Postoperative ileus (HCC) 02/27/2022   Ileus, postoperative (HCC) 02/26/2022   Choledocholithiasis 02/19/2022   DNR (do not resuscitate) 02/19/2022   Left elbow pain 01/26/2022   Mid back pain on left side 01/26/2022   Rib pain 01/26/2022   Acute pain of left shoulder 11/12/2021   Leukocytes in urine 11/12/2021   Urinary frequency 11/12/2021   Thrush 10/08/2021   Hemiplegia, 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   Insomnia    Prediabetes    Acute 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 (HCC)    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   Transaminitis 07/27/2021   UTI (urinary tract infection) 07/27/2021   CVA (cerebral vascular accident) (HCC) 07/27/2021   Fall 07/27/2021   Hyperglycemia 07/27/2021   Cholelithiasis 07/27/2021   Hypoxia 07/27/2021   Nausea and vomiting 07/27/2021   Acute metabolic encephalopathy 07/27/2021   Normocytic anemia 07/27/2021   Chronic back pain 07/27/2021   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   Closed fracture of first lumbar vertebra with routine healing 02/03/2021   Closed  fracture of multiple ribs 11/18/2020   Anxiety 01/29/2020   Leg pain, bilateral 01/29/2020   Ingrown toenail 07/13/2019   Lumbar spondylosis 05/02/2018   Pain in left knee 03/09/2018   Osteoarthritis of left hip 01/16/2018   Trochanteric bursitis of left hip 01/16/2018   Preventative health care 09/26/2017   HTN (hypertension) 07/19/2015   Hyperlipidemia 07/19/2015   Great toe pain 02/11/2014   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   Obesity (BMI 30-39.9) 06/25/2013   Renal insufficiency 06/25/2013   Weakness of left arm 06/25/2013   Sebaceous cyst 03/03/2011   Sprain of lumbar region 07/31/2010   Rib pain, left 08/29/2009   Carotid artery stenosis, asymptomatic, bilateral 05/02/2009   Eczema, atopic 05/31/2008   Vitamin D deficiency 03/01/2008   BPH (benign prostatic hyperplasia) 08/06/2007   Fasting hyperglycemia 12/21/2006   History of right MCA infarct 2006    ONSET DATE: 06/29/22 REFERRING DIAG:  Diagnosis  I63.9 (ICD-10-CM) - Cerebrovascular accident (CVA), unspecified mechanism (HCC)  W19.XXXD (ICD-10-CM) - Fall, subsequent encounter    THERAPY DIAG:  Muscle weakness (  generalized)  Other abnormalities of gait and mobility  Hemiplegia and hemiparesis following cerebral infarction affecting left non-dominant side (HCC)  Difficulty in walking, not elsewhere classified  Repeated falls  Acute pain of left shoulder  History of falling  Rationale for Evaluation and Treatment: Rehabilitation  SUBJECTIVE:                                                                                                                                                                                         SUBJECTIVE STATEMENT:   Met patient and wife at their house today for a home visit to problem solve some issues that have been happening. Like getting out of bed and sitting on the commode PERTINENT HISTORY:  BLAYTON HUTTNER is a 83 year old man with dementia, CKD, HTN,  CAD, HLD and history of CVA  (2006, 2023), Parkinson's  PAIN:  Are you having pain? Faces/behavior scale- mild pain BLEs  and back   PRECAUTIONS: Fall  WEIGHT BEARING RESTRICTIONS: No  FALLS: Has patient fallen in last 6 months? No  LIVING ENVIRONMENT: Lives with: lives with their family and lives with their spouse Lives in: House/apartment Stairs:  1 brick high step Has following equipment at home: Environmental consultant - 2 wheeled, Wheelchair (manual), Shower bench, bed side commode, Grab bars, and hospital bed, sliding pad for car  PLOF: Independent with basic ADLs and Independent with household mobility without device  PATIENT GOALS: Walk around the block with his wife. Be able to use the dining room chair rather than have to use the W/C. Increased I with bed mobility and simple tasks at home like make some coffee or brush his teeth, Step over tub to shower, has bench, but he can't really use it. Up and down the one step to get to back deck. 11/23/22: Patient's wife William [name] updated his goals today: Walk around store (gets distracted), swing a golf club again (thinking of driving range), get up on his own, walk without leaning forward, more recognition of the left side (from CVA), not need gait belt, OT-- be able to get dressed more on his own, navigate the newspaper.  OBJECTIVE:  TREATMENT: 09/06/23 Home assessment with wife: Bathroom set up is very cramped and crowded.  Has over commode toilet and grab bars, they have a walker in front of the commode, the way it is set up he has to walk side ways into the set up and side ways out, I lowered the front legs and put them in the shower that is directly in front and had the back legs out, this gave him about 10" more to get b/n the toilet  and the walker so he could walk in and then turn instead of side ways. Bed transfer from supine:  We did this 4 times, dropped the side rail, raised head of the bed and wife typically uses a strap that she gives William [name] and he  pulls up with it.  Today he got up without it twice, we talked about protecting William [name]/s back with leaning back and having a wide base of support, also to give William [name] cues to lean forward as he tends to be retropulsive.   Shower:  Micah Flesher in and looked at this with William [name], gave some ideas as he is now holding grab bar and stepping over the tub to get in.  Asked about safer way of sitting and bringing legs over she said this was tried but they were unable to get his legs over. Went over HEP:  in his chair talked about reciprocal patterns and she looked on [place] for a pedal machine Gait: outside with HHA to the mail box and back cues for step length  08/25/23 Nustep level 5 x 6 minutes, cues for larger movements Bike level 4 x 6 minutes UBE level 3 x 5 mintues Worked on some bed mobility Bridges Partial sit ups Supine left leg sliding board hip abduction Seated volley ball with some cues to use the left hand Gait outside with hand hold assist and some cues for steps, some walking in grass  08/18/23 Nustep level 5 x 9 minutes some cues for larger motions Bike level 5 x 6 minutes UBE level 4 x 4 minutes Sitting on the mat table elbow touches a lot of cues to come to the left side Partial sit ups 3x10 Bridges 3x10 Hooklying marches Seated ball taps left hand and right hand Supine to sit x 2 with min A Gait with HHA 2 x 220 feet fast pace, cues for BIG steps  08/11/23  Nustep L5 all four extremities x8 minutes Mod cues for good machine use Lateral trunk crunches (limited on the L due to shoulder pain) with light ball toss in between x10 B  STS no UEs up to tip toes to help counter posterior lean when first standing up x10, up to MinA as fatigue increased  Tried double march in sitting with transition to march in standing but this was a bit overwhelming for him Alternating toe taps on 4 inch step progressing to 8 inch step with intermittent cues to focus on task at hand  Forward steps to target to  promote improved step length, Mod multimodal cues  Light L shoulder stretching for flexion to tolerance   07/26/23 Getting in and out of car with him due to rain, he struggled due to a lot going on and it seemed to fluster him some Nustep level 5 x 7 minutes UBE level 3 x 6 minutes Supine bridges Partial sit ups Standing ball toss and volleyball some cues for left hand Gait with HHA x 3 laps Practice with him and wife getting in and out of bed, x3  07/21/23 Gait outside with HHA on the right negotiating curbs, cues for step length and posture, around to the front door,  Nustep level 5 x 7 minutes 2.5# LAQ 2.5# [employer] with wife regarding renewal and what is going on with William [name], she reports that in the morning he freezes when trying to go to the bathroom, and cannot sit, she also reports that she is using Max A to get him from supine to sit.  We practiced this with William [name] in the bathroom and today he had no big issues of freezing. I started some education with wife on some physics of levers and fulcrums of trying to get him out of bed from supine Pball press for core work Retested [place] and 5XSTS  07/14/23 Nustep level 5 x 7 minutes UBE level 2 x 4 minutes Passive stretch hand, fingers, wrist, elbow and shoulder left Standing step turn reach touch Standing reaching for numbers on the wall with left hand and right Sit to stand with cues for both arms to push up. Gait outside with HHA 200 feet then rest and then 200 feet through some grass and uneven terrain to the car  PATIENT EDUCATION: Education details: none new 02/16/23 Person educated: Patient and Spouse Education method: Explanation, Demonstration, Tactile cues, and Verbal cues Education comprehension: verbalized understanding, returned demonstration, verbal cues required, tactile cues required, and needs further education    ------------------------------------------------------------------------------------------------------- (Measures in this section from initial evaluation unless otherwise noted) DIAGNOSTIC FINDINGS:  MRI 2021 with degenerative changes in lumbar spine, L3-4 R subarticular stenosis COGNITION: Overall cognitive status: History of cognitive impairments - at baseline SENSATION: Not tested COORDINATION: Moderately impaired BLE MUSCLE LENGTH: Hamstrings: severely restricted B Thomas test: Severely restricted B. POSTURE: rounded shoulders, forward head, increased thoracic kyphosis, posterior pelvic tilt, and flexed trunk  LOWER EXTREMITY ROM:   BLE extremely stiff throughout, limited hip ROM in all planes, B knees limited in extension. LOWER EXTREMITY MMT:  3+/5 throughout. Unable to determine accuracy of MMT due to cognition. Functionally demosntrates poor coordinated activation and poor muscular endurance. BED MOBILITY: Min A for Supine<> Sit. Patient was using a ladder in his hospital bed and could move MI. TRANSFERS: Min A, mod VC, tends to lean back.  11/02/22 independent with use of hands on arm chair with a couple of tries CURB: Min A-Has one step out to his deck. STAIRS: N/A  GAIT: Gait pattern: step to pattern, decreased arm swing- Right, decreased arm swing- Left, decreased step length- Right, decreased step length- Left, shuffling, festinating, trunk flexed, narrow BOS, poor foot clearance- Right, and poor foot clearance- Left Distance walked: 44' Assistive device utilized: Environmental consultant - 2 wheeled Level of assistance: Occasional min A Comments: mod VC to stay close to walker, take longer steps. FUNCTIONAL TESTS:  5 times sit to stand: 38.23  09/29/22  could not performs 5XSTS, 11/02/22 = 47 seconds with cues to use arms, 07/21/23 really struggled today 55 seconds Timed up and go (TUG): 47.91  with RW, 07/21/22 TUG 29  seconds without device, 09/29/22 25 seconds, 11/02/22 TUG 20 seconds, 07/21/23 = 26  seconds no device SBA                                                                     PATIENT EDUCATION: Education details: Progress towards goals, POC Person educated: Patient and Spouse Education method: Programmer, multimedia, Facilities manager, and Verbal cues  Education comprehension: verbalized understanding, returned demonstration, verbal cues required, and needs further education  HOME EXERCISE PROGRAM: PWR! moves  GOALS: Goals reviewed with patient? Yes  SHORT TERM GOALS: Target date: 12/22/22  Perform HEP for PWR! Moves with cues from family. Baseline: Pt's wife, William [name], notes walking and hitting beach ball  as main HEP. Goal status: GOAL MET, 12/20/2022   2. Decrease 5 x STS to < 35 sec to demonstrate improved functional strength  Baseline: 47 seconds on 5/21 per note; 34.5 sec UE support 12/20/2022  Goal status: GOAL MET, 12/20/2022   3. Perform sit <> stand transfers from average chair height with Supervision.  Baseline: CGA  12/20/2022; (01/10/23) CGA  Goal status: NOT MET  LONG TERM GOALS: Target date: 04/22/23  Patient will perform HEP progression with caregiver assist. Baseline: has minimal HEP Goal status: ONGOING  2.  Decrease 5 x STS to < 25 sec to demonstrate improved functional strength Baseline: 47 seconds on 5/21 per note; (01/10/23) 36 sec w/ cues for speed/sequence>30.38 sec with BUE supprot Goal status: PARTIALLY MET, 01/24/2023  3. Improve quality of gait to be able to ambulate x 100 feet with supervision. Baseline: CGA with cues  Goal status: Met 08/18/23  4.  Patient will perform his bed mobility with MI including rolling, sit<>supine, and scooting in bed. Baseline: on hard surface mat he is independent but on soft bed at home he struggles, wife reports only needs a hand to get up now 11/02/22; (01/10/23) modified indep.  Goal status: MET  5.  Patient will demonstrate floor<>stand with CGA and verbal cues for patient's wife to be able to assist. Baseline:  Has to call  9-1-1; +2 max assist floor>stand with UE support 01/19/2023 Goal status: GOAL NOT MET 01/19/2023  **UPDATED GOALS BELOW ** per recert 05/17/2023  SHORT TERM GOALS: Target date: 02/25/2023   Pt will perform HEP to maintain lower body strength, flexibility, posture with wife's assistance. Baseline:  Goal status: met 04/12/23  2. Pt will perform sit to stand, 8 of 10 reps with BUE support, with supervision, to maintain safety with transfers.  Baseline: min guard  Goal status: 02/07/23-CGA from elevated surface and max VC to lean forward, ongoing   LONG TERM GOALS: Target date: 07/18/23   Patient will perform HEP progression to maintain lower body strength, flexibility, posture with wife's assistance. Baseline:  Goal status: ongoing but progressing 08/25/23  2.  Pt will ambulate 150-175 ft in 2 MWT with min guard assist, to preserve functional mobility within the home.  Baseline: Goal status:  met 08/25/23  3. Wife will verbalize tips for fall prevention education to minimize fall frequency Baseline:  Goal status:progressing and doing well 4.  Patient will be able to toilet without freezing for over a 4 week period 5.  Patient will be able to get out of bed from supine with wife assist minimal Assist, will see him at his house next week 08/25/23   ASSESSMENT:  CLINICAL IMPRESSION:  As noted above patient and wife we met at their house and tried to problem solve some issues with getting out of bed and toilet.  He tends to get stuck and not sit.  See above OBJECTIVE IMPAIRMENTS: Abnormal gait, decreased activity tolerance, decreased balance, decreased cognition, decreased coordination, decreased endurance, decreased mobility, difficulty walking, decreased ROM, decreased strength, decreased safety awareness, impaired flexibility, impaired UE functional use, improper body mechanics, and postural dysfunction.   PLAN:  PT FREQUENCY: 1x/week  PT DURATION: 12 weeks  PLANNED INTERVENTIONS:  Therapeutic exercises, Therapeutic activity, Neuromuscular re-education, Balance training, Gait training, Patient/Family education, Self Care, Joint mobilization, Stair training, Moist heat, and Manual therapy  PLAN FOR NEXT SESSION:   work on core and reciprocal patterns Patient Details  Name: DURAN OHERN MRN: [id number] Date of Birth: [birth year] Referring  Provider:  [name], [name], *  Stacie Glaze, PT

## 2023-09-07 DIAGNOSIS — N390 Urinary tract infection, site not specified: Secondary | ICD-10-CM | POA: Diagnosis not present

## 2023-09-07 DIAGNOSIS — G20A1 Parkinson's disease without dyskinesia, without mention of fluctuations: Secondary | ICD-10-CM | POA: Diagnosis not present

## 2023-09-07 DIAGNOSIS — M47816 Spondylosis without myelopathy or radiculopathy, lumbar region: Secondary | ICD-10-CM | POA: Diagnosis not present

## 2023-09-07 DIAGNOSIS — M545 Low back pain, unspecified: Secondary | ICD-10-CM | POA: Diagnosis not present

## 2023-09-07 DIAGNOSIS — C679 Malignant neoplasm of bladder, unspecified: Secondary | ICD-10-CM | POA: Diagnosis not present

## 2023-09-07 DIAGNOSIS — I129 Hypertensive chronic kidney disease with stage 1 through stage 4 chronic kidney disease, or unspecified chronic kidney disease: Secondary | ICD-10-CM | POA: Diagnosis not present

## 2023-09-08 ENCOUNTER — Encounter: Payer: Self-pay | Admitting: Podiatry

## 2023-09-08 ENCOUNTER — Ambulatory Visit: Payer: PPO | Admitting: Podiatry

## 2023-09-08 DIAGNOSIS — B351 Tinea unguium: Secondary | ICD-10-CM

## 2023-09-08 DIAGNOSIS — M79676 Pain in unspecified toe(s): Secondary | ICD-10-CM | POA: Diagnosis not present

## 2023-09-08 DIAGNOSIS — Z7901 Long term (current) use of anticoagulants: Secondary | ICD-10-CM

## 2023-09-10 ENCOUNTER — Emergency Department (HOSPITAL_COMMUNITY)

## 2023-09-10 ENCOUNTER — Encounter (HOSPITAL_COMMUNITY): Payer: Self-pay

## 2023-09-10 ENCOUNTER — Other Ambulatory Visit: Payer: Self-pay

## 2023-09-10 ENCOUNTER — Emergency Department (HOSPITAL_COMMUNITY)
Admission: EM | Admit: 2023-09-10 | Discharge: 2023-09-10 | Disposition: A | Discharge status: Home / Self Care | Attending: Emergency Medicine | Admitting: Emergency Medicine

## 2023-09-10 DIAGNOSIS — R61 Generalized hyperhidrosis: Secondary | ICD-10-CM | POA: Diagnosis not present

## 2023-09-10 DIAGNOSIS — R55 Syncope and collapse: Secondary | ICD-10-CM | POA: Insufficient documentation

## 2023-09-10 DIAGNOSIS — Z79899 Other long term (current) drug therapy: Secondary | ICD-10-CM | POA: Insufficient documentation

## 2023-09-10 DIAGNOSIS — R42 Dizziness and giddiness: Secondary | ICD-10-CM | POA: Diagnosis not present

## 2023-09-10 DIAGNOSIS — I959 Hypotension, unspecified: Secondary | ICD-10-CM | POA: Diagnosis not present

## 2023-09-10 LAB — COMPREHENSIVE METABOLIC PANEL WITH GFR
ALT: <5 U/L (ref 0–44)
AST: 12 U/L — ABNORMAL LOW (ref 15–41)
Albumin: 2.9 g/dL — ABNORMAL LOW (ref 3.5–5.0)
Alkaline Phosphatase: 107 U/L (ref 38–126)
Anion gap: 9 (ref 5–15)
BUN: 28 mg/dL — ABNORMAL HIGH (ref 8–23)
CO2: 22 mmol/L (ref 22–32)
Calcium: 8.1 mg/dL — ABNORMAL LOW (ref 8.9–10.3)
Chloride: 105 mmol/L (ref 98–111)
Creatinine, Ser: 1.51 mg/dL — ABNORMAL HIGH (ref 0.61–1.24)
GFR, Estimated: 46 mL/min — ABNORMAL LOW (ref >60)
Glucose, Bld: 108 mg/dL — ABNORMAL HIGH (ref 70–99)
Potassium: 4.3 mmol/L (ref 3.5–5.1)
Sodium: 136 mmol/L (ref 135–145)
Total Bilirubin: 0.5 mg/dL (ref 0.0–1.2)
Total Protein: 5.8 g/dL — ABNORMAL LOW (ref 6.5–8.1)

## 2023-09-10 LAB — URINALYSIS, ROUTINE W REFLEX MICROSCOPIC
Bilirubin Urine: NEGATIVE
Glucose, UA: NEGATIVE mg/dL
Hgb urine dipstick: NEGATIVE
Ketones, ur: 5 mg/dL — AB
Nitrite: NEGATIVE
Protein, ur: 100 mg/dL — AB
Specific Gravity, Urine: 1.017 (ref 1.005–1.030)
pH: 5 (ref 5.0–8.0)

## 2023-09-10 LAB — CBC WITH DIFFERENTIAL/PLATELET
Abs Immature Granulocytes: 0.04 10*3/uL (ref 0.00–0.07)
Basophils Absolute: 0 10*3/uL (ref 0.0–0.1)
Basophils Relative: 0 %
Eosinophils Absolute: 0.1 10*3/uL (ref 0.0–0.5)
Eosinophils Relative: 1 %
HCT: 31.8 % — ABNORMAL LOW (ref 39.0–52.0)
Hemoglobin: 10.1 g/dL — ABNORMAL LOW (ref 13.0–17.0)
Immature Granulocytes: 1 %
Lymphocytes Relative: 9 %
Lymphs Abs: 0.8 10*3/uL (ref 0.7–4.0)
MCH: 33.2 pg (ref 26.0–34.0)
MCHC: 31.8 g/dL (ref 30.0–36.0)
MCV: 104.6 fL — ABNORMAL HIGH (ref 80.0–100.0)
Monocytes Absolute: 0.6 10*3/uL (ref 0.1–1.0)
Monocytes Relative: 8 %
Neutro Abs: 6.6 10*3/uL (ref 1.7–7.7)
Neutrophils Relative %: 81 %
Platelets: 162 10*3/uL (ref 150–400)
RBC: 3.04 MIL/uL — ABNORMAL LOW (ref 4.22–5.81)
RDW: 13.2 % (ref 11.5–15.5)
WBC: 8.1 10*3/uL (ref 4.0–10.5)
nRBC: 0 % (ref 0.0–0.2)

## 2023-09-10 LAB — TROPONIN I (HIGH SENSITIVITY)
Troponin I (High Sensitivity): 7 ng/L (ref <18)
Troponin I (High Sensitivity): 7 ng/L (ref <18)

## 2023-09-10 LAB — LACTIC ACID, PLASMA: Lactic Acid, Venous: 1.4 mmol/L (ref 0.5–1.9)

## 2023-09-10 LAB — CBG MONITORING, ED: Glucose-Capillary: 101 mg/dL — ABNORMAL HIGH (ref 70–99)

## 2023-09-10 LAB — POC OCCULT BLOOD, ED: Fecal Occult Bld: NEGATIVE

## 2023-09-10 MED ORDER — SODIUM CHLORIDE 0.9 % IV BOLUS
1000.0000 mL | Freq: Once | INTRAVENOUS | Status: AC
  Administered 2023-09-10: 1000 mL via INTRAVENOUS

## 2023-09-10 NOTE — Discharge Instructions (Addendum)
 Contact a health care provider if: You have episodes of near fainting. Get help right away if: You faint. You hit your head or are injured after fainting. You have any of these symptoms that may indicate trouble with your heart: Fast or irregular heartbeats (palpitations). Unusual pain in your chest, abdomen, or back. Shortness of breath. You have a seizure. You have a severe headache. You are confused. You have vision problems. You have severe weakness or trouble walking. You are bleeding from your mouth or rectum, or you have black or tarry stool. These symptoms may represent a serious problem that is an emergency. Do not wait to see if your symptoms will go away. Get medical help right away. Call your local emergency services (911 in the [place] Do not drive yourself to the hospital.  Make sure to take all meds as prescribed especially his iron  I have sent a urine culture out which should result in 24 to 36 hours.  Please check on MyChart and/or we will call you regarding results.

## 2023-09-10 NOTE — ED Triage Notes (Signed)
 Pt BIBA from home, c/o dizziness, 2 near syncopal episodes.  Recently treated for UTI.  Denies pain, sob. A&Ox3 at baseline.   BP sitting 102/58 standing 86/50 HR 64 Etco2 24-29 CBG 135

## 2023-09-10 NOTE — ED Provider Notes (Signed)
 Received patient from previous provider pending completion of ED workup.  See her note  In short, patient presents to emergency department for near syncopal episode following attempting to make a bowel movement today.  This has occurred in the past.  EMS reports that he initially had SBP of 80s so they provided him with 1 L IVF PTA.  He recently was diagnosed with UTI and completed Keflex 2 days ago.  Rectal exam: conducted with Army Chaco RN. External rectum wo hemorrhoids. Stool obtained is dark likely 2/2 oral iron supplementation. Normal rectal tone  Vital signs have remained hemodynamically stable with no tachycardia nor hypotension noted  ED workup is notable for hemoglobin of 10.1.  Last hemoglobin was 12 9 days ago.  Patient has a history of anemia and takes iron pills at home.  He denies melena nor hematochezia.  Fecal occult is negative.  I discussed with patient and patient's wife that he must take his iron pills and not miss a dose.  Lactic acid 1.4.  Troponin WNL.  CBG 108.  Creatinine 1.51 with a baseline typically at 1.8.    Urine appears mildly infected.  This may be due to recent treatment of UTI and/or insufficient treatment.  We do have a urine culture pending.  I discussed this with patient and patient's wife who are to follow-up with this result in the next 24 hours and tomorrow we will call them if he requires further treatment for UTI.  Although symptoms appear to be vasovagal in nature, this may have contributed to his syncopal episode today. No emergent etiology of symptoms found form workup. Feel he is safe for dc to f/u with PCP outpatient.  Discussed ED workup, disposition, return to ED precautions with patient who expresses understanding agrees with plan.  All questions answered to their satisfaction.  They are agreeable to plan.  Discharge instructions provided on paperwork   Judithann Sheen, PA 09/10/23 2221    Terald Sleeper, MD 09/10/23 2225

## 2023-09-10 NOTE — ED Provider Notes (Signed)
 [place] EMERGENCY DEPARTMENT AT [facility] Provider Note   CSN: [id number] Arrival date & time: 09/10/23  1551     History {Add pertinent medical, surgical, social history, OB history to HPI:1} No chief complaint on file.   William [name] is a 83 y.o. male who presents emergency department with a chief complaint of near syncope.  This occurred while he was making a bowel movement today on the toilet.  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  He also has a history of Parkinson's disorder, hypertension,. he also has some memory deficits.  Patient reports that today he felt his heart racing.  EMS reports that he had near syncope twice.  They found him diaphoretic with a low blood pressure in the 80s systolic.  He is given a liter of fluid prior to arrival.  He denies any chest pain or shortness of breath.  He states that he remained with a heart rate in about the 70s.  Patient reports he has had a single episode similar to this in the past.  HPI     Home Medications Prior to Admission medications   Medication Sig Start Date End Date Taking? Authorizing Provider  acetaminophen (TYLENOL) 325 MG tablet Take 2 tablets (650 mg total) by mouth every 6 (six) hours as needed for mild pain. 02/24/22   Eric Form, PA-C  amantadine (SYMMETREL) 100 MG capsule Take 1 capsule (100 mg total) by mouth daily. 03/21/23   Donato Schultz, DO  amLODipine (NORVASC) 10 MG tablet Take 1 tablet (10 mg total) by mouth every morning. 07/05/23   [name], [name], DO  bacitracin ointment Apply 1 Application topically 2 (two) times daily. 04/24/23   Wallis Bamberg, PA-C  carBAMazepine (TEGRETOL) 100 MG/5ML suspension Take 20 mLs (400 mg total) by mouth daily. Take 5 mLs every morning, take 5 mLs at noon and 10 mLs at bedtime. 08/09/23   Plovsky, Earvin Hansen, MD  carbidopa-levodopa (SINEMET IR) 25-100 MG tablet Take 1.5 tablets by mouth 3 (three) times  daily. 09/01/23   Drema Dallas, DO  Cholecalciferol (VITAMIN D3) 50 MCG (2000 UT) CHEW Chew 1 tablet by mouth daily.    [provider]  clobetasol cream (TEMOVATE) 0.05 % Apply 1 Application to affected areas topically up to 2 (two) times daily as needed. Do not apply to face, groin, or under arms 11/02/22   Nita Sells, MD  clopidogrel (PLAVIX) 75 MG tablet Take 1 tablet (75 mg total) by mouth daily. 09/05/23   Donato Schultz, DO  cyanocobalamin (VITAMIN B12) 1000 MCG tablet Take 1 tablet (1,000 mcg total) by mouth daily. 05/16/23   Seabron Spates R, DO  ferrous sulfate 325 (65 FE) MG EC tablet Take 325 mg by mouth 2 (two) times daily. *Crushed*    [provider]  fluticasone (CUTIVATE) 0.05 % cream 1 application  at bedtime as needed (psoriasis). 12/16/16   [provider]  hydrALAZINE (APRESOLINE) 25 MG tablet Take 1 tablet (25 mg total) by mouth 3 (three) times daily. 08/31/23 03/01/24  Donato Schultz, DO  LORazepam (ATIVAN) 1 MG tablet Take 1/4 tablet by mouth in the morning, 1/2 tablet by mouth at lunch and 1 tablet by mouth at bedtime 04/04/23   Plovsky, Earvin Hansen, MD  LORazepam (ATIVAN) 1 MG tablet Take 1/4 tablet (0.25 mg total) by mouth in the morning AND 1/2 tablet (0.5 mg total) daily  in the afternoon AND 1 tablet (1 mg total) at bedtime. 07/28/23     LORazepam (ATIVAN) 1 MG tablet Take 1/4th (0.25 mg) tablet in the morning, 1/2 (half) tablet at 2pm and 1 (1mg ) tablet by mouth at bedtime. 08/09/23   Plovsky, Earvin Hansen, MD  Multiple Vitamins-Minerals (ADULT ONE DAILY GUMMIES) CHEW Chew 1 tablet by mouth every morning.    [provider]  NONFORMULARY OR COMPOUNDED ITEM Transport chair  dx parkinsons, hx falls 04/21/23   [name], [name], DO  pantoprazole (PROTONIX) 40 MG tablet Take 1 tablet (40 mg total) by mouth daily. 11/15/22   Donato Schultz, DO  polyethylene glycol (MIRALAX / GLYCOLAX) 17 g packet Take 17 g by mouth daily. Patient  taking differently: Take 17 g by mouth every other day. 10/02/21   Pricilla Loveless, MD  tamsulosin (FLOMAX) 0.4 MG CAPS capsule Take 1 capsule (0.4 mg total) by mouth daily after supper. Patient taking differently: Take 0.4 mg by mouth at bedtime. 04/11/23   Donato Schultz, DO  tamsulosin (FLOMAX) 0.4 MG CAPS capsule Take 1 capsule (0.4 mg total) by mouth at bedtime. 06/02/23     traZODone (DESYREL) 150 MG tablet Take 0.5 tablets (75 mg total) by mouth at bedtime. *Crushed* 08/09/23   Plovsky, Earvin Hansen, MD  triamcinolone cream (KENALOG) 0.1 % Apply 1 application topically 2 (two) times daily. 11/15/22   Donato Schultz, DO      Allergies    Bee venom, Strawberry extract, Latex, Zetia [ezetimibe], Adhesive [tape], and Statins    Review of Systems   Review of Systems  Physical Exam Updated Vital Signs Ht 5\' 9"  (1.753 m)   Wt 74.4 kg   BMI 24.22 kg/m  Physical Exam Vitals and nursing note reviewed.  Constitutional:      General: He is not in acute distress.    Appearance: He is well-developed. He is not diaphoretic.  HENT:     Head: Normocephalic and atraumatic.  Eyes:     General: No scleral icterus.    Extraocular Movements: Extraocular movements intact.     Conjunctiva/sclera: Conjunctivae normal.     Pupils: Pupils are equal, round, and reactive to light.  Cardiovascular:     Rate and Rhythm: Normal rate and regular rhythm.     Heart sounds: Normal heart sounds.  Pulmonary:     Effort: Pulmonary effort is normal. No respiratory distress.     Breath sounds: Normal breath sounds.  Abdominal:     Palpations: Abdomen is soft.     Tenderness: There is no abdominal tenderness.  Musculoskeletal:     Cervical back: Normal range of motion and neck supple.  Skin:    General: Skin is warm and dry.  Neurological:     Mental Status: He is alert and oriented to person, place, and time. Mental status is at baseline.     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    ED Results / Procedures / Treatments   Labs (all labs ordered are listed, but only abnormal results are displayed) Labs Reviewed  CBC WITH DIFFERENTIAL/PLATELET  COMPREHENSIVE METABOLIC PANEL WITH GFR  URINALYSIS, ROUTINE W REFLEX MICROSCOPIC  TSH  CBG MONITORING, ED  TROPONIN I (HIGH SENSITIVITY)    EKG None  Radiology No results found.  Procedures Procedures  {Document cardiac monitor, telemetry assessment procedure when appropriate:1}  Medications Ordered in ED Medications -  No data to display  ED Course/ Medical Decision Making/ A&P Clinical Course as of 09/11/23 1940  Sat Sep 10, 2023  1832 BP: 125/69 [AH]  2008 WBC: 8.1 [AH]  2016 Urinalysis, Routine w reflex microscopic -Urine, Clean Catch(!) [AH]    Clinical Course User Index [AH] Arthor Captain, PA-C   {   Click here for ABCD2, HEART and other calculatorsREFRESH Note before signing :1}                              Medical Decision Making Amount and/or Complexity of Data Reviewed Labs: ordered. Decision-making details documented in ED Course. Radiology: ordered.  .   This patient presents to the ED for concern of syncope, this involves an extensive number of treatment options, and is a complaint that carries with it a high risk of complications and morbidity.  The differential for syncope is extensive and includes, but is not limited to: arrythmia (Vtach, SVT, SSS, sinus arrest, AV block, bradycardia) aortic stenosis, AMI, HOCM, PE, atrial myxoma, pulmonary hypertension, orthostatic hypotension, (hypovolemia, drug effect, GB syndrome, micturition, cough, swall) carotid sinus sensitivity, Seizure, TIA/CVA, hypoglycemia,  Vertigo.   Co morbidities: .***  Social Determinants of Health:  .***  Additional history:  {Additional history obtained from ***} {External records from outside source obtained and reviewed including}  Lab Tests:  I  Ordered, and personally interpreted labs.  The pertinent results include:  ***  Imaging Studies:  I ordered imaging studies including *** I independently visualized and interpreted imaging which showed *** I agree with the radiologist interpretation  Cardiac Monitoring/ECG:  .The patient was maintained on a cardiac monitor.  I personally viewed and interpreted the cardiac monitored which showed an underlying rhythm of: ***  Medicines ordered and prescription drug management:  I ordered medication including  Medications  sodium chloride 0.9 % bolus 1,000 mL (0 mLs Intravenous Stopped 09/10/23 2149)   for *** Reevaluation of the patient after these medicines showed that the patient {resolved/improved/worsened:23923::"improved"} I have reviewed the patients home medicines and have made adjustments as needed  Test Considered:  .***  Critical Interventions:  .***  Consultations Obtained: ***  Problem List / ED Course:  .   ICD-10-CM   1. Vasovagal syncope  R55       MDM: ***   Dispostion:  After consideration of the diagnostic results and the patients response to treatment, I feel that the patent would benefit from ***.   {Document critical care time when appropriate:1} {Document review of labs and clinical decision tools ie heart score, Chads2Vasc2 etc:1}  {Document your independent review of radiology images, and any outside records:1} {Document your discussion with family members, caretakers, and with consultants:1} {Document social determinants of health affecting pt's care:1} {Document your decision making why or why not admission, treatments were needed:1} Final Clinical Impression(s) / ED Diagnoses Final diagnoses:  None    Rx / DC Orders ED Discharge Orders     None

## 2023-09-10 NOTE — ED Notes (Signed)
 Pt ambulatory with assistance in room.

## 2023-09-10 NOTE — ED Notes (Signed)
Discharge instructions reviewed with patient. Patient questions answered and opportunity for education reviewed. Patient voices understanding of discharge instructions with no further questions. Patient to lobby via wheelchair. 

## 2023-09-11 LAB — URINE CULTURE: Culture: NO GROWTH

## 2023-09-11 NOTE — Progress Notes (Signed)
 Subjective: Chief Complaint  Patient presents with   RFC    RM#12 RFC/ ingrown bilateral big toes    83 y.o. returns the office today for painful, elongated, thickened toenails which he cannot trim himself.  His toenails are sensitive particularly on the hallux toenails.  Denies any associated drainage or redness from these sites.  No open lesions.  Does not report any other concerns today.  He is on Plavix.  PCP: Donato Schultz, DO Last seen 09/01/2023  Objective: NAD, presents with his wife DP/PT pulses palpable, CRT less than 3 seconds  Nails hypertrophic, dystrophic, elongated, brittle, discolored x 10.  Incurvation of the hallux toenails without any edema, erythema or signs of infection.  There is tenderness overlying the nails 1-5 bilaterally.  No swelling around the nails, redness or drainage or any signs of infection. Hammertoes present No open lesions No pain with calf compression, swelling, warmth, erythema.  Assessment: Patient presents with symptomatic onychomycosis  Plan: -Treatment options including alternatives, risks, complications were discussed -Nails debrided x 10 without any complications or bleeding.   -Advised continued foot checks  Return in about 3 months (around 12/09/2023).  Vivi Barrack DPM

## 2023-09-12 ENCOUNTER — Other Ambulatory Visit: Payer: Self-pay | Admitting: Family Medicine

## 2023-09-12 NOTE — Telephone Encounter (Signed)
 Pt's spouse brought in more paperwork that is needed to be filled out (FMLA paperwork) Pt also would like to be called when document ready with the other paperwork that is pending. Please advise pt at tel [phone]. Document put at front office tray under providers name.

## 2023-09-13 ENCOUNTER — Encounter: Payer: Self-pay | Admitting: Physical Therapy

## 2023-09-13 ENCOUNTER — Other Ambulatory Visit (HOSPITAL_BASED_OUTPATIENT_CLINIC_OR_DEPARTMENT_OTHER): Payer: Self-pay

## 2023-09-13 ENCOUNTER — Telehealth: Payer: Self-pay

## 2023-09-13 ENCOUNTER — Ambulatory Visit: Attending: Family Medicine | Admitting: Physical Therapy

## 2023-09-13 DIAGNOSIS — M25512 Pain in left shoulder: Secondary | ICD-10-CM | POA: Insufficient documentation

## 2023-09-13 DIAGNOSIS — R262 Difficulty in walking, not elsewhere classified: Secondary | ICD-10-CM | POA: Insufficient documentation

## 2023-09-13 DIAGNOSIS — Z9181 History of falling: Secondary | ICD-10-CM | POA: Diagnosis not present

## 2023-09-13 DIAGNOSIS — I69354 Hemiplegia and hemiparesis following cerebral infarction affecting left non-dominant side: Secondary | ICD-10-CM | POA: Insufficient documentation

## 2023-09-13 DIAGNOSIS — M6281 Muscle weakness (generalized): Secondary | ICD-10-CM | POA: Diagnosis not present

## 2023-09-13 DIAGNOSIS — R2689 Other abnormalities of gait and mobility: Secondary | ICD-10-CM | POA: Insufficient documentation

## 2023-09-13 DIAGNOSIS — R296 Repeated falls: Secondary | ICD-10-CM | POA: Diagnosis not present

## 2023-09-13 MED ORDER — AMANTADINE HCL 100 MG PO CAPS
100.0000 mg | ORAL_CAPSULE | Freq: Every day | ORAL | 1 refills | Status: DC
  Filled 2023-09-13: qty 90, 90d supply, fill #0
  Filled 2023-12-12: qty 90, 90d supply, fill #1

## 2023-09-13 NOTE — Transitions of Care (Post Inpatient/ED Visit) (Signed)
 09/13/2023  Name: William [name] MRN: [id number] DOB: 25-Oct-1940  Today's TOC FU Call Status: Today's TOC FU Call Status:: Successful TOC FU Call Completed TOC FU Call Complete Date: 09/13/23 Patient's Name and Date of Birth confirmed.  Transition Care Management Follow-up Telephone Call Date of Discharge: 09/10/23 Discharge Facility: [facility]) Type of Discharge: Emergency Department Reason for ED Visit: Other: (Vasovagal syncope) How have you been since you were released from the hospital?: Better Any questions or concerns?: No  Items Reviewed: Did you receive and understand the discharge instructions provided?: Yes Medications obtained,verified, and reconciled?: Yes (Medications Reviewed) Any new allergies since your discharge?: No Dietary orders reviewed?: NA Do you have support at home?: Yes People in Home: spouse  Medications Reviewed Today: Medications Reviewed Today     Reviewed by Anthoney Harada, LPN (Licensed Practical Nurse) on 09/13/23 at 1222  Med List Status: <None>   Medication Order Taking? Sig Documenting Provider Last Dose Status Informant  acetaminophen (TYLENOL) 325 MG tablet [id number] Yes Take 2 tablets (650 mg total) by mouth every 6 (six) hours as needed for mild pain. Eric Form, PA-C Taking Active Spouse/Significant Other, Pharmacy Records  amantadine (SYMMETREL) 100 MG capsule [id number] Yes Take 1 capsule (100 mg total) by mouth daily. Donato Schultz, DO Taking Active Spouse/Significant Other, Pharmacy Records  amLODipine (NORVASC) 10 MG tablet [id number] Yes Take 1 tablet (10 mg total) by mouth every morning. Seabron Spates R, DO Taking Active   bacitracin ointment [id number] Yes Apply 1 Application topically 2 (two) times daily. Wallis Bamberg, PA-C Taking Active   carBAMazepine (TEGRETOL) 100 MG/5ML suspension [id number] Yes Take 20 mLs (400 mg total) by mouth daily. Take 5 mLs every morning, take 5 mLs at noon and 10 mLs at  bedtime. Archer Asa, MD Taking Active   carbidopa-levodopa (SINEMET IR) 25-100 MG tablet [id number] Yes Take 1.5 tablets by mouth 3 (three) times daily. Drema Dallas, DO Taking Active   Cholecalciferol (VITAMIN D3) 50 MCG (2000 UT) CHEW [id number] Yes Chew 1 tablet by mouth daily. [provider] Taking Active Spouse/Significant Other, Pharmacy Records           Med Note Tobias Alexander Apr 18, 2023  2:56 PM)    clobetasol cream (TEMOVATE) 0.05 % [id number] Yes Apply 1 Application to affected areas topically up to 2 (two) times daily as needed. Do not apply to face, groin, or under arms Nita Sells, MD Taking Active Spouse/Significant Other, Pharmacy Records  clopidogrel (PLAVIX) 75 MG tablet [id number] Yes Take 1 tablet (75 mg total) by mouth daily. Seabron Spates R, DO Taking Active   cyanocobalamin (VITAMIN B12) 1000 MCG tablet [id number] Yes Take 1 tablet (1,000 mcg total) by mouth daily. Donato Schultz, DO Taking Active   ferrous sulfate 325 (65 FE) MG EC tablet [id number] Yes Take 325 mg by mouth 2 (two) times daily. *Crushed* [provider] Taking Active Spouse/Significant Other, Pharmacy Records           Med Note Tobias Alexander Apr 18, 2023  2:25 PM)    fluticasone (CUTIVATE) 0.05 % cream [id number] Yes 1 application  at bedtime as needed (psoriasis). [provider] Taking Active Spouse/Significant Other, Pharmacy Records  hydrALAZINE (APRESOLINE) 25 MG tablet [id number] Yes Take 1 tablet (25 mg total) by mouth 3 (three) times daily. Seabron Spates R, DO Taking Active   LORazepam (ATIVAN) 1 MG tablet [id number]  Yes Take 1/4 tablet by mouth in the morning, 1/2 tablet by mouth at lunch and 1 tablet by mouth at bedtime Archer Asa, MD Taking Active Spouse/Significant Other, Pharmacy Records  LORazepam (ATIVAN) 1 MG tablet [id number] Yes Take 1/4 tablet (0.25 mg total) by mouth in the morning AND 1/2 tablet (0.5 mg total)  daily in the afternoon AND 1 tablet (1 mg total) at bedtime.  Taking Active   LORazepam (ATIVAN) 1 MG tablet [id number] Yes Take 1/4th (0.25 mg) tablet in the morning, 1/2 (half) tablet at 2pm and 1 (1mg ) tablet by mouth at bedtime. Archer Asa, MD Taking Active   Multiple Vitamins-Minerals (ADULT ONE DAILY GUMMIES) CHEW [id number] Yes Chew 1 tablet by mouth every morning. [provider] Taking Active Spouse/Significant Other, Pharmacy Records  NONFORMULARY OR COMPOUNDED ITEM [id number] Yes Transport chair  dx parkinsons, hx falls [name], [place], DO Taking Active   pantoprazole (PROTONIX) 40 MG tablet [id number] Yes Take 1 tablet (40 mg total) by mouth daily. Donato Schultz, DO Taking Active Spouse/Significant Other, Pharmacy Records  polyethylene glycol (MIRALAX / GLYCOLAX) 17 g packet [id number] Yes Take 17 g by mouth daily.  Patient taking differently: Take 17 g by mouth every other day.   Pricilla Loveless, MD Taking Active Spouse/Significant Other, Pharmacy Records           Med Note Tobias Alexander Apr 18, 2023  2:26 PM)    tamsulosin [facility]) 0.4 MG CAPS capsule [id number] Yes Take 1 capsule (0.4 mg total) by mouth daily after supper.  Patient taking differently: Take 0.4 mg by mouth at bedtime.   Donato Schultz, DO Taking Active Spouse/Significant Other, Pharmacy Records  tamsulosin [facility]) 0.4 MG CAPS capsule [id number] Yes Take 1 capsule (0.4 mg total) by mouth at bedtime.  Taking Active   traZODone (DESYREL) 150 MG tablet [id number] Yes Take 0.5 tablets (75 mg total) by mouth at bedtime. *CrushedArcher Asa, MD Taking Active   triamcinolone cream (KENALOG) 0.1 % [id number] Yes Apply 1 application topically 2 (two) times daily. Donato Schultz, DO Taking Active Spouse/Significant Other, Pharmacy Records            Home Care and Equipment/Supplies: Were Home Health Services Ordered?: NA Any new equipment or medical supplies  ordered?: NA  Functional Questionnaire: Do you need assistance with bathing/showering or dressing?: No Do you need assistance with meal preparation?: Yes Do you need assistance with eating?: No Do you have difficulty maintaining continence: No Do you need assistance with getting out of bed/getting out of a chair/moving?: No Do you have difficulty managing or taking your medications?: Yes (assisted by spouse)  Follow up appointments reviewed: PCP Follow-up appointment confirmed?: NA Specialist Hospital Follow-up appointment confirmed?: NA Do you need transportation to your follow-up appointment?: No Do you understand care options if your condition(s) worsen?: Yes-patient verbalized understanding    SIGNATURE Kandis Fantasia, LPN [facility] Health Advisor [place] [facility] You Are. We Are. [facility] Direct Dial [phone]

## 2023-09-13 NOTE — Therapy (Signed)
 OUTPATIENT PHYSICAL THERAPY NEURO TREATMENT  Patient Name: William [name] MRN: [id number] DOB:March 27, 1941, 83 y.o., male Today's Date: 09/13/2023  PCP: Donato Schultz  DO REFERRING PROVIDER: same   END OF SESSION:  PT End of Session - 09/13/23 1454     Visit Number [id number]    Date for PT Re-Evaluation 09/18/23    Authorization Type HTA    PT Start Time 1447    PT Stop Time 1530    PT Time Calculation (min) 43 min    Activity Tolerance Patient tolerated treatment well    Behavior During Therapy [facility] for tasks assessed/performed                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   HTN (hypertension) 07/19/2015   Hyperlipidemia    Major neurocognitive disorder 01/09/2014   Mild, related to stroke history   Nocturia    Renal insufficiency 06/25/2013   S/P carotid endarterectomy    BILATERAL ICA--  PATENT PER DUPLEX  05-19-2012   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 09/26/2017   Right calf   Urinary frequency    Vitamin D deficiency    Past Surgical History:  Procedure Laterality Date   APPENDECTOMY  AS CHILD   CARDIOVASCULAR STRESS TEST  03-27-2012  DR CRENSHAW   LOW RISK LEXISCAN STUDY-- PROBABLE NORMAL PERFUSION AND SOFT TISSUE ATTENUATION/  NO ISCHEMIA/ EF 51%   CAROTID ENDARTERECTOMY Bilateral LEFT  11-12-2008  DR GREG HAYES   RIGHT ICA  2006  ([facility])   CHOLECYSTECTOMY N/A 02/23/2022   Procedure: LAPAROSCOPIC CHOLECYSTECTOMY;  Surgeon: Quentin Ore, MD;  Location: [facility] OR;  Service: General;  Laterality: N/A;   CYSTOSCOPY W/ RETROGRADES  Bilateral 06/22/2021   Procedure: CYSTOSCOPY WITH RETROGRADE PYELOGRAM;  Surgeon: Marcine Matar, MD;  Location: [facility] [place];  Service: Urology;  Laterality: Bilateral;   CYSTOSCOPY WITH LITHOLAPAXY N/A 02/26/2013   Procedure: CYSTOSCOPY WITH LITHOLAPAXY;  Surgeon: Marcine Matar, MD;  Location: [facility];  Service: Urology;  Laterality: N/A;   ENDOSCOPIC RETROGRADE CHOLANGIOPANCREATOGRAPHY (ERCP) WITH PROPOFOL N/A 02/22/2022   Procedure: ENDOSCOPIC RETROGRADE CHOLANGIOPANCREATOGRAPHY (ERCP) WITH PROPOFOL;  Surgeon: Jeani Hawking, MD;  Location[facility];  Service: Gastroenterology;  Laterality: N/A;   EYE SURGERY  Jan. 2016   cataract surgery both eyes   INGUINAL HERNIA REPAIR Right 11-08-2006   IR KYPHO EA ADDL LEVEL THORACIC OR LUMBAR  02/12/2021   IR RADIOLOGIST EVAL & MGMT  02/18/2021   MASS EXCISION N/A 03/03/2016   Procedure: EXCISION OF BACK  MASS;  Surgeon: Almond Lint, MD;  Location: [facility];  Service: General;  Laterality: N/A;   MOHS SURGERY Left 1/ 2016   Dr Margo Aye-- Basal cell   PROSTATE SURGERY     REMOVAL OF STONES  02/22/2022   Procedure: REMOVAL OF STONES;  Surgeon: Jeani Hawking, MD;  Location: [facility] ENDOSCOPY;  Service: Gastroenterology;;   Dennison Mascot  02/22/2022   Procedure: Dennison Mascot;  Surgeon: Jeani Hawking, MD;  Location: [facility];  Service: Gastroenterology;;   TRANSURETHRAL RESECTION OF BLADDER TUMOR WITH MITOMYCIN-C N/A 06/22/2021   Procedure: TRANSURETHRAL RESECTION OF BLADDER TUMOR;  Surgeon: Marcine Matar, MD;  Location: [facility];  Service: Urology;  Laterality: N/A;   TRANSURETHRAL RESECTION OF PROSTATE N/A 02/26/2013   Procedure: TRANSURETHRAL RESECTION OF THE PROSTATE WITH GYRUS INSTRUMENTS;  Surgeon: Marcine Matar, MD;  Location: [facility];  Service: Urology;  Laterality: N/A;   TRANSURETHRAL RESECTION OF PROSTATE N/A 06/22/2021   Procedure: TRANSURETHRAL  RESECTION OF THE PROSTATE (TURP);  Surgeon: Marcine Matar, MD;  Location: [facility];  Service: Urology;  Laterality: N/A;   Patient Active Problem List   Diagnosis Date Noted   Bronchitis 06/16/2023   Impacted cerumen of left ear 06/16/2023   Acute encephalopathy 04/18/2023   Parkinson's disease (HCC) 04/18/2023   History of CVA with residual deficit 04/18/2023   Chronic kidney disease, stage 3b (HCC) 04/18/2023   Dementia with behavioral disturbance (HCC) 04/18/2023   Anemia of chronic disease 04/18/2023   Laceration of right upper extremity 02/24/2023   Dermatitis 02/24/2023   Lung nodules 02/24/2023   Dysuria 08/03/2022   Nonintractable headache 07/01/2022   Bilateral impacted cerumen 06/24/2022   Rash 06/15/2022   Postoperative ileus (HCC) 02/27/2022   Ileus, postoperative (HCC) 02/26/2022   Choledocholithiasis 02/19/2022   DNR (do not resuscitate) 02/19/2022   Left elbow pain 01/26/2022   Mid back pain on left side 01/26/2022   Rib pain 01/26/2022   Acute pain of left shoulder 11/12/2021   Leukocytes in urine 11/12/2021   Urinary frequency 11/12/2021   Thrush 10/08/2021   Hemiplegia, 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   Insomnia    Prediabetes    Acute 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 (HCC)    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   Transaminitis 07/27/2021   UTI (urinary tract infection) 07/27/2021   CVA (cerebral vascular accident) (HCC) 07/27/2021   Fall 07/27/2021   Hyperglycemia 07/27/2021   Cholelithiasis 07/27/2021   Hypoxia 07/27/2021   Nausea and vomiting 07/27/2021   Acute metabolic encephalopathy 07/27/2021   Normocytic anemia 07/27/2021   Chronic back pain 07/27/2021   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   Closed fracture of first lumbar vertebra with routine healing 02/03/2021   Closed fracture of multiple ribs 11/18/2020   Anxiety  01/29/2020   Leg pain, bilateral 01/29/2020   Ingrown toenail 07/13/2019   Lumbar spondylosis 05/02/2018   Pain in left knee 03/09/2018   Osteoarthritis of left hip 01/16/2018   Trochanteric bursitis of left hip 01/16/2018   Preventative health care 09/26/2017   HTN (hypertension) 07/19/2015   Hyperlipidemia 07/19/2015   Great toe pain 02/11/2014   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   Obesity (BMI 30-39.9) 06/25/2013   Renal insufficiency 06/25/2013   Weakness of left arm 06/25/2013   Sebaceous cyst 03/03/2011   Sprain of lumbar region 07/31/2010   Rib pain, left 08/29/2009   Carotid artery stenosis, asymptomatic, bilateral 05/02/2009   Eczema, atopic 05/31/2008   Vitamin D deficiency 03/01/2008   BPH (benign prostatic hyperplasia) 08/06/2007   Fasting hyperglycemia 12/21/2006   History of right MCA infarct 2006    ONSET DATE: 06/29/22 REFERRING DIAG:  Diagnosis  I63.9 (ICD-10-CM) - Cerebrovascular accident (CVA), unspecified mechanism (HCC)  W19.XXXD (ICD-10-CM) - Fall, subsequent encounter    THERAPY DIAG:  Muscle weakness (generalized)  Other abnormalities of gait and mobility  Hemiplegia and hemiparesis following cerebral infarction affecting left non-dominant side (HCC)  Difficulty in walking, not elsewhere classified  Repeated falls  Acute pain of left shoulder  History of falling  Rationale for Evaluation and Treatment: Rehabilitation  SUBJECTIVE:                                                                                                                                                                                         SUBJECTIVE STATEMENT:   Wife reports that the transfers at home have been going well, she reports that on the weekend she had to call EMS due to a syncopal issue, he had on the commode, everything was ruled out except syncopal issues with toileting PERTINENT HISTORY:  William [name] is a 83 year old man with dementia,  CKD, HTN, CAD, HLD and history of CVA  (2006, 2023), Parkinson's  PAIN:  Are you having pain? Faces/behavior scale- mild pain BLEs  and back   PRECAUTIONS: Fall  WEIGHT BEARING RESTRICTIONS: No  FALLS: Has patient fallen in last 6 months? No  LIVING ENVIRONMENT: Lives with: lives with their family and lives with their spouse Lives in: House/apartment Stairs:  1 brick high step Has following equipment at home: Environmental consultant - 2 wheeled, Wheelchair (manual), Shower bench, bed side commode, Grab bars, and hospital bed, sliding pad for car  PLOF: Independent with basic ADLs and Independent with household mobility without device  PATIENT GOALS: Walk around the block with his wife. Be able to use the dining room chair rather than have to use the W/C. Increased I with bed mobility and simple tasks at home like make some coffee or brush his teeth, Step over tub to shower, has bench, but he can't really use it. Up and down the one step to get to back deck. 11/23/22: Patient's wife William [name] updated his goals today: Walk around store (gets distracted), swing a golf club again (thinking of driving range), get up on his own, walk without leaning forward, more recognition of the left side (from CVA), not need gait belt, OT-- be able to get dressed more on his own, navigate the newspaper.  OBJECTIVE:  TREATMENT: 09/13/23 Nustep level 5 x 7 mintues Bike level 4 x 6 minutes Gait outside with HHA/CGA 250' x 2 Supine bridges Isometric abs Left leg abduction Supine to sitting with min A Seated volley ball Seated ball kicks  09/06/23 Home assessment with wife: Bathroom set up is very cramped and crowded.  Has over commode toilet and grab bars, they have a walker in front of the commode, the way it is set up he has to walk side ways  into the set up and side ways out, I lowered the front legs and put them in the shower that is directly in front and had the back legs out, this gave him about 10" more to get b/n the  toilet and the walker so he could walk in and then turn instead of side ways. Bed transfer from supine:  We did this 4 times, dropped the side rail, raised head of the bed and wife typically uses a strap that she gives William [name] and he pulls up with it.  Today he got up without it twice, we talked about protecting [name]/s back with leaning back and having a wide base of support, also to give William [name] cues to lean forward as he tends to be retropulsive.   Shower:  Micah Flesher in and looked at this with William [name], gave some ideas as he is now holding grab bar and stepping over the tub to get in.  Asked about safer way of sitting and bringing legs over she said this was tried but they were unable to get his legs over. Went over HEP:  in his chair talked about reciprocal patterns and she looked on [place] for a pedal machine Gait: outside with HHA to the mail box and back cues for step length  08/25/23 Nustep level 5 x 6 minutes, cues for larger movements Bike level 4 x 6 minutes UBE level 3 x 5 mintues Worked on some bed mobility Bridges Partial sit ups Supine left leg sliding board hip abduction Seated volley ball with some cues to use the left hand Gait outside with hand hold assist and some cues for steps, some walking in grass  08/18/23 Nustep level 5 x 9 minutes some cues for larger motions Bike level 5 x 6 minutes UBE level 4 x 4 minutes Sitting on the mat table elbow touches a lot of cues to come to the left side Partial sit ups 3x10 Bridges 3x10 Hooklying marches Seated ball taps left hand and right hand Supine to sit x 2 with min A Gait with HHA 2 x 220 feet fast pace, cues for BIG steps  08/11/23  Nustep L5 all four extremities x8 minutes Mod cues for good machine use Lateral trunk crunches (limited on the L due to shoulder pain) with light ball toss in between x10 B  STS no UEs up to tip toes to help counter posterior lean when first standing up x10, up to MinA as fatigue increased  Tried double  march in sitting with transition to march in standing but this was a bit overwhelming for him Alternating toe taps on 4 inch step progressing to 8 inch step with intermittent cues to focus on task at hand  Forward steps to target to promote improved step length, Mod multimodal cues  Light L shoulder stretching for flexion to tolerance   07/26/23 Getting in and out of car with him due to rain, he struggled due to a lot going on and it seemed to fluster him some Nustep level 5 x 7 minutes UBE level 3 x 6 minutes Supine bridges Partial sit ups Standing ball toss and volleyball some cues for left hand Gait with HHA x 3 laps Practice with him and wife getting in and out of bed, x3  07/21/23 Gait outside with HHA on the right negotiating curbs, cues for step length and posture, around to the front door,  Nustep level 5 x 7 minutes 2.5# LAQ 2.5# [employer] with wife regarding renewal and  what is going on with William [name], she reports that in the morning he freezes when trying to go to the bathroom, and cannot sit, she also reports that she is using Max A to get him from supine to sit.  We practiced this with William [name] in the bathroom and today he had no big issues of freezing. I started some education with wife on some physics of levers and fulcrums of trying to get him out of bed from supine Pball press for core work Retested [place] and 5XSTS  07/14/23 Nustep level 5 x 7 minutes UBE level 2 x 4 minutes Passive stretch hand, fingers, wrist, elbow and shoulder left Standing step turn reach touch Standing reaching for numbers on the wall with left hand and right Sit to stand with cues for both arms to push up. Gait outside with HHA 200 feet then rest and then 200 feet through some grass and uneven terrain to the car  PATIENT EDUCATION: Education details: none new 02/16/23 Person educated: Patient and Spouse Education method: Explanation, Demonstration, Tactile cues, and Verbal cues Education comprehension:  verbalized understanding, returned demonstration, verbal cues required, tactile cues required, and needs further education   ------------------------------------------------------------------------------------------------------- (Measures in this section from initial evaluation unless otherwise noted) DIAGNOSTIC FINDINGS:  MRI 2021 with degenerative changes in lumbar spine, L3-4 R subarticular stenosis COGNITION: Overall cognitive status: History of cognitive impairments - at baseline SENSATION: Not tested COORDINATION: Moderately impaired BLE MUSCLE LENGTH: Hamstrings: severely restricted B Thomas test: Severely restricted B. POSTURE: rounded shoulders, forward head, increased thoracic kyphosis, posterior pelvic tilt, and flexed trunk  LOWER EXTREMITY ROM:   BLE extremely stiff throughout, limited hip ROM in all planes, B knees limited in extension. LOWER EXTREMITY MMT:  3+/5 throughout. Unable to determine accuracy of MMT due to cognition. Functionally demosntrates poor coordinated activation and poor muscular endurance. BED MOBILITY: Min A for Supine<> Sit. Patient was using a ladder in his hospital bed and could move MI. TRANSFERS: Min A, mod VC, tends to lean back.  11/02/22 independent with use of hands on arm chair with a couple of tries CURB: Min A-Has one step out to his deck. STAIRS: N/A  GAIT: Gait pattern: step to pattern, decreased arm swing- Right, decreased arm swing- Left, decreased step length- Right, decreased step length- Left, shuffling, festinating, trunk flexed, narrow BOS, poor foot clearance- Right, and poor foot clearance- Left Distance walked: 12' Assistive device utilized: Environmental consultant - 2 wheeled Level of assistance: Occasional min A Comments: mod VC to stay close to walker, take longer steps. FUNCTIONAL TESTS:  5 times sit to stand: 38.23  09/29/22  could not performs 5XSTS, 11/02/22 = 47 seconds with cues to use arms, 07/21/23 really struggled today 55 seconds Timed up  and go (TUG): 47.91  with RW, 07/21/22 TUG 29  seconds without device, 09/29/22 25 seconds, 11/02/22 TUG 20 seconds, 07/21/23 = 26 seconds no device SBA                                                                     PATIENT EDUCATION: Education details: Progress towards goals, POC Person educated: Patient and Spouse Education method: Explanation, Demonstration, and Verbal cues  Education comprehension: verbalized understanding, returned demonstration, verbal cues required, and needs further  education  HOME EXERCISE PROGRAM: PWR! moves  GOALS: Goals reviewed with patient? Yes  SHORT TERM GOALS: Target date: 12/22/22  Perform HEP for PWR! Moves with cues from family. Baseline: Pt's wife, William [name], notes walking and hitting beach ball as main HEP. Goal status: GOAL MET, 12/20/2022   2. Decrease 5 x STS to < 35 sec to demonstrate improved functional strength  Baseline: 47 seconds on 5/21 per note; 34.5 sec UE support 12/20/2022  Goal status: GOAL MET, 12/20/2022   3. Perform sit <> stand transfers from average chair height with Supervision.  Baseline: CGA  12/20/2022; (01/10/23) CGA  Goal status: NOT MET  LONG TERM GOALS: Target date: 04/22/23  Patient will perform HEP progression with caregiver assist. Baseline: has minimal HEP Goal status: progressing  2.  Decrease 5 x STS to < 25 sec to demonstrate improved functional strength Baseline: 47 seconds on 5/21 per note; (01/10/23) 36 sec w/ cues for speed/sequence>30.38 sec with BUE supprot Goal status: PARTIALLY MET, 01/24/2023  3. Improve quality of gait to be able to ambulate x 100 feet with supervision. Baseline: CGA with cues  Goal status: Met 08/18/23  4.  Patient will perform his bed mobility with MI including rolling, sit<>supine, and scooting in bed. Baseline: on hard surface mat he is independent but on soft bed at home he struggles, wife reports only needs a hand to get up now 11/02/22; (01/10/23) modified indep.  Goal status: MET  5.   Patient will demonstrate floor<>stand with CGA and verbal cues for patient's wife to be able to assist. Baseline:  Has to call 9-1-1; +2 max assist floor>stand with UE support 01/19/2023 Goal status: GOAL NOT MET 01/19/2023  **UPDATED GOALS BELOW ** per recert 05/17/2023  SHORT TERM GOALS: Target date: 02/25/2023   Pt will perform HEP to maintain lower body strength, flexibility, posture with wife's assistance. Baseline:  Goal status: met 04/12/23  2. Pt will perform sit to stand, 8 of 10 reps with BUE support, with supervision, to maintain safety with transfers.  Baseline: min guard  Goal status: 02/07/23-CGA from elevated surface and max VC to lean forward, ongoing   LONG TERM GOALS: Target date: 07/18/23   Patient will perform HEP progression to maintain lower body strength, flexibility, posture with wife's assistance. Baseline:  Goal status: ongoing but progressing 08/25/23  2.  Pt will ambulate 150-175 ft in 2 MWT with min guard assist, to preserve functional mobility within the home.  Baseline: Goal status:  met 08/25/23  3. Wife will verbalize tips for fall prevention education to minimize fall frequency Baseline:  Goal status:progressing and doing well 4.  Patient will be able to toilet without freezing for over a 4 week period 5.  Patient will be able to get out of bed from supine with wife assist minimal Assist, will see him at his house next week 08/25/23   ASSESSMENT:  CLINICAL IMPRESSION:  Patients wife got him a pedal bike for when he is in his chair, asked about its use, I recommended 1x/day 10 minutes He did have an ED visit over the weekend due to syncope.  He did well with the walking today, just a little more shuffling steps. OBJECTIVE IMPAIRMENTS: Abnormal gait, decreased activity tolerance, decreased balance, decreased cognition, decreased coordination, decreased endurance, decreased mobility, difficulty walking, decreased ROM, decreased strength, decreased safety  awareness, impaired flexibility, impaired UE functional use, improper body mechanics, and postural dysfunction.   PLAN:  PT FREQUENCY: 1x/week  PT DURATION: 12 weeks  PLANNED INTERVENTIONS: Therapeutic exercises, Therapeutic activity, Neuromuscular re-education, Balance training, Gait training, Patient/Family education, Self Care, Joint mobilization, Stair training, Moist heat, and Manual therapy  PLAN FOR NEXT SESSION:   work on core and reciprocal patterns Patient Details  Name: DERRYL UHER MRN: [id number] Date of Birth: [birth year] Referring Provider:  [name], [name], *  Stacie Glaze, PT

## 2023-09-15 ENCOUNTER — Encounter: Payer: Self-pay | Admitting: Family Medicine

## 2023-09-16 NOTE — Telephone Encounter (Signed)
 Forms placed in your office

## 2023-09-20 ENCOUNTER — Ambulatory Visit: Payer: Self-pay | Admitting: Licensed Clinical Social Worker

## 2023-09-20 ENCOUNTER — Telehealth: Payer: Self-pay | Admitting: Family Medicine

## 2023-09-20 NOTE — Telephone Encounter (Signed)
 Copied from CRM [id number]. Topic: General - Other >> Sep 20, 2023 10:53 AM [name] H wrote: Reason for CRM: Patients wife [name] calling to follow up on 2 sets of forms that she dropped off at office, wanting to know if they are ready for pick up. Please reach out to [name], thanks.  [name]  [phone]

## 2023-09-20 NOTE — Patient Instructions (Addendum)
 Visit Information  Thank you for taking time to visit with me today. Please don't hesitate to contact me if I can be of assistance to you before our next scheduled appointment.  LCSW has provided client and spouse of client with LCSW name and phone number. LCSW has encouraged client and spouse of client to call LCSW as needed for SW support for client at [phone]  Problems:   Care Coordination needs related to Dementia: Parkinson's Disease dementia, Cognitive Deficits               Needs help with ADLs; Needs help with meals; needs help with transportation             Ongoing pain issues faced             Cognitive issues  CSW Clinical Goal(s):   Over the next 30 days the Patient will attend all scheduled medical appointments as evidenced by patient report and care team review of appointment completion in EMR:               Over next 30 days, patient will demonstrate improved health management independence as evidenced by participation in HHPT session one time weekly in the home.  Interventions:  Dementia Care:   Current level of care: Home with other family or significant other(s): spouse  Evaluation of patient safety in current living environment and review of Dementia resources and support (client is in safe home environment with support of his spouse) ADL's Assessed needs, level of care concerns, how currently meeting needs and barriers to care Spouse of client is very supportive of client . She helps client with all of his daily needs Client goes to Adult Day program 3 days a week for support Interior and spatial designer)  Facility  Emotional Support Provided Client and spouse of client desire that he remain at home at present with needed supports in place  Advance Directive No support needed at present with Advanced Directive Active listening / Reflection utilized Caregiver stress acknowledged :client now attends Day Program in area for 3 days weekly. This helps with management of  caregiver stress of Jonas Goh  Patient Goals/Self-Care Activities:  Connect with provider for ongoing mental health treatment.   Attend scheduled medical appointments Participate in Day program 3 days weekly as scheduled ([facility]) Take medications as prescribed Allow time for adequate rest and eat meals on scheduled routine  Plan:   Spouse of client to call LCSW as needed for SW support for client at [phone]  Please call the care guide team at [phone] if you need to cancel, schedule, or reschedule an appointment.   Please go to [facility] [address], [place] [phone]) if you are experiencing a Mental Health or Behavioral Health Crisis or need someone to talk to.  Client and CHAE OOMMEN have been provided information, instructions on program support and have DECLINED receiving further information at this time  Client and CORLEY MAFFEO have also received information on how to contact Care Team for support. LCSW has given client and spouse of client LCSW name and phone number and encouraged client or spouse to call LCSW as needed for SW support.   Lorna Few  MSW, LCSW [facility] Licensed Clinical Social Worker Direct Dial:  [phone] Fax:  [phone] Website:  [email or web address]

## 2023-09-20 NOTE — Patient Outreach (Signed)
 Complex Care Management   Visit Note  09/20/2023  Name:  William [name] MRN: [id number] DOB: 14-Sep-1940  Situation: Referral received for Complex Care Management related to Dementia I obtained verbal consent from Garnett Farm, spouse of client.  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  Neurological  Parkinson's Dementia. Hx of CVA    HEENT    Not assessed  Cardiovascular  HTN    Respiratory    Fatigues occasionally. Takes rest breaks  Endocrine  Unable to assess     Gastrointestinal  Nausea and vomiting    Genitourinary  UTI history    Integumentary    Unable to assess  Musculoskeletal    Receives HHPT one time weekly in home. At risk for falls  Psychosocial  Behavioral issues occasionally. Needs help with ADLs daily. Needs transport help, meal help, help with taking prescribed medications     Vitals:  Information not received  Medications Reviewed Today     Reviewed by Michiel Cowboy (Social Worker) on 09/20/23 at 1603  Med List  Status: <None>   Medication Order Taking? Sig Documenting Provider Last Dose Status Informant  acetaminophen (TYLENOL) 325 MG tablet [id number] No Take 2 tablets (650 mg total) by mouth every 6 (six) hours as needed for mild pain. Eric Form, PA-C Taking Active Spouse/Significant Other, Pharmacy Records  amantadine (SYMMETREL) 100 MG capsule [id number]  Take 1 capsule (100 mg total) by mouth daily. Seabron Spates R, DO  Active   amLODipine (NORVASC) 10 MG tablet [id number] No Take 1 tablet (10 mg total) by mouth every morning. Seabron Spates R, DO Taking Active   bacitracin ointment [id number] No Apply 1 Application topically 2 (two) times daily. Wallis Bamberg, PA-C Taking Active   carBAMazepine (TEGRETOL) 100 MG/5ML suspension [id number] No Take 20 mLs (400 mg total) by mouth daily. Take 5 mLs every morning, take 5 mLs at noon and 10 mLs at bedtime. Archer Asa, MD Taking Active   carbidopa-levodopa (SINEMET IR) 25-100 MG tablet [id number] No Take 1.5 tablets by mouth 3 (three) times daily. Drema Dallas, DO Taking Active   Cholecalciferol (VITAMIN D3) 50 MCG (2000 UT) CHEW [id number] No Chew 1 tablet by mouth daily. [provider] Taking Active Spouse/Significant Other, Pharmacy Records           Med Note Tobias Alexander Apr 18, 2023  2:56 PM)  clobetasol cream (TEMOVATE) 0.05 % [id number] No Apply 1 Application to affected areas topically up to 2 (two) times daily as needed. Do not apply to face, groin, or under arms Nita Sells, MD Taking Active Spouse/Significant Other, Pharmacy Records  clopidogrel (PLAVIX) 75 MG tablet [id number] No Take 1 tablet (75 mg total) by mouth daily. Donato Schultz, DO Taking Active   cyanocobalamin (VITAMIN B12) 1000 MCG tablet [id number] No Take 1 tablet (1,000 mcg total) by mouth daily. Donato Schultz, DO Taking Active   ferrous sulfate 325 (65 FE) MG EC tablet [id number] No Take 325 mg by mouth 2 (two) times daily.  *Crushed* [provider] Taking Active Spouse/Significant Other, Pharmacy Records           Med Note Tobias Alexander Apr 18, 2023  2:25 PM)    fluticasone (CUTIVATE) 0.05 % cream [id number] No 1 application  at bedtime as needed (psoriasis). [provider] Taking Active Spouse/Significant Other, Pharmacy Records  hydrALAZINE (APRESOLINE) 25 MG tablet [id number] No Take 1 tablet (25 mg total) by mouth 3 (three) times daily. Donato Schultz, DO Taking Active   LORazepam (ATIVAN) 1 MG tablet [id number] No Take 1/4 tablet by mouth in the morning, 1/2 tablet by mouth at lunch and 1 tablet by mouth at bedtime Archer Asa, MD Taking Active Spouse/Significant Other, Pharmacy Records  LORazepam (ATIVAN) 1 MG tablet [id number] No Take 1/4 tablet (0.25 mg total) by mouth in the morning AND 1/2 tablet (0.5 mg total) daily in the afternoon AND 1 tablet (1 mg total) at bedtime.  Taking Active   LORazepam (ATIVAN) 1 MG tablet [id number] No Take 1/4th (0.25 mg) tablet in the morning, 1/2 (half) tablet at 2pm and 1 (1mg ) tablet by mouth at bedtime. Archer Asa, MD Taking Active   Multiple Vitamins-Minerals (ADULT ONE DAILY GUMMIES) CHEW [id number] No Chew 1 tablet by mouth every morning. [provider] Taking Active Spouse/Significant Other, Pharmacy Records  NONFORMULARY OR COMPOUNDED ITEM [id number] No Transport chair  dx parkinsons, hx falls [name], [place] R, DO Taking Active   pantoprazole (PROTONIX) 40 MG tablet [id number] No Take 1 tablet (40 mg total) by mouth daily. Donato Schultz, DO Taking Active Spouse/Significant Other, Pharmacy Records  polyethylene glycol (MIRALAX / GLYCOLAX) 17 g packet [id number] No Take 17 g by mouth daily.  Patient taking differently: Take 17 g by mouth every other day.   Pricilla Loveless, MD Taking Active Spouse/Significant Other, Pharmacy Records           Med Note Tobias Alexander Apr 18, 2023  2:26 PM)     tamsulosin [facility]) 0.4 MG CAPS capsule [id number] No Take 1 capsule (0.4 mg total) by mouth daily after supper.  Patient taking differently: Take 0.4 mg by mouth at bedtime.   Donato Schultz, DO Taking Active Spouse/Significant Other, Pharmacy Records  tamsulosin [facility]) 0.4 MG CAPS capsule [id number] No Take 1 capsule (0.4 mg total) by mouth at bedtime.  Taking Active   traZODone (DESYREL) 150 MG tablet [id number] No Take 0.5 tablets (75 mg total) by mouth at bedtime. *CrushedArcher Asa, MD Taking Active   triamcinolone cream (KENALOG) 0.1 % [id number] No Apply 1 application topically 2 (two) times daily. Donato Schultz, DO Taking Active Spouse/Significant Other, Pharmacy Records            Recommendation:   PCP Follow-up Attend scheduled medical appointments Participate in Day  Program 3 days weekly as scheduled Take medications as prescribed Communicate as needed with PCP related to mental health needs of client Client/spouse of client to call LCSW as needed for SW help for client at [phone]  Follow Up Plan:    LCSW has provided client and spouse of client with LCSW name and phone number. LCSW has encouraged client or spouse of client to call LCSW as needed for SW support for client at [phone]  Lorna Few  MSW, LCSW [facility] Licensed Clinical Social Worker Direct Dial:  [phone] Fax:  [phone] Website:  [email or web address]

## 2023-09-21 IMAGING — RF DG SWALLOWING FUNCTION
15 of 24 series · 15 of 24 positions shown · non-contrast
Comparison: None.

CLINICAL DATA: Dysphagia. Cough/GE reflux disease/other secondary
diagnosis

EXAM:
MODIFIED BARIUM SWALLOW
TECHNIQUE: Different consistencies of barium were administered orally to the
patient by the Speech Pathologist. Imaging of the pharynx was
performed in the lateral projection. The radiologist was present in
the fluoroscopy room for this study, providing personal supervision.
FLUOROSCOPY:
Radiation Exposure Index (as provided by the fluoroscopic device):
8.3 mGy Kerma

[Series 2: cp_standard · 2 acquisitions, 1 frame shown (1 of 15)]
[im 1/2]
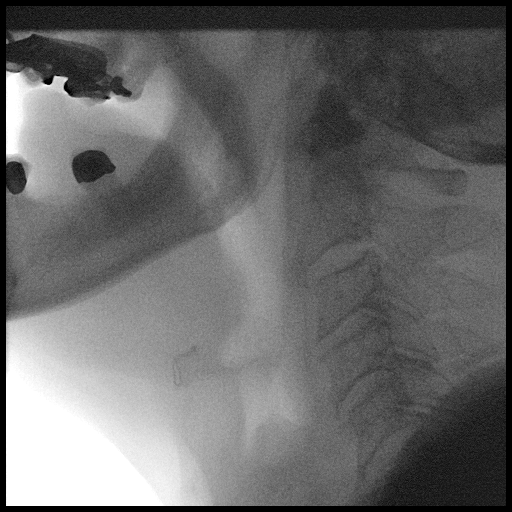

[Series 4: cp_standard · 1 of 10 frames shown (2 of 15)]
[frame 1/10]
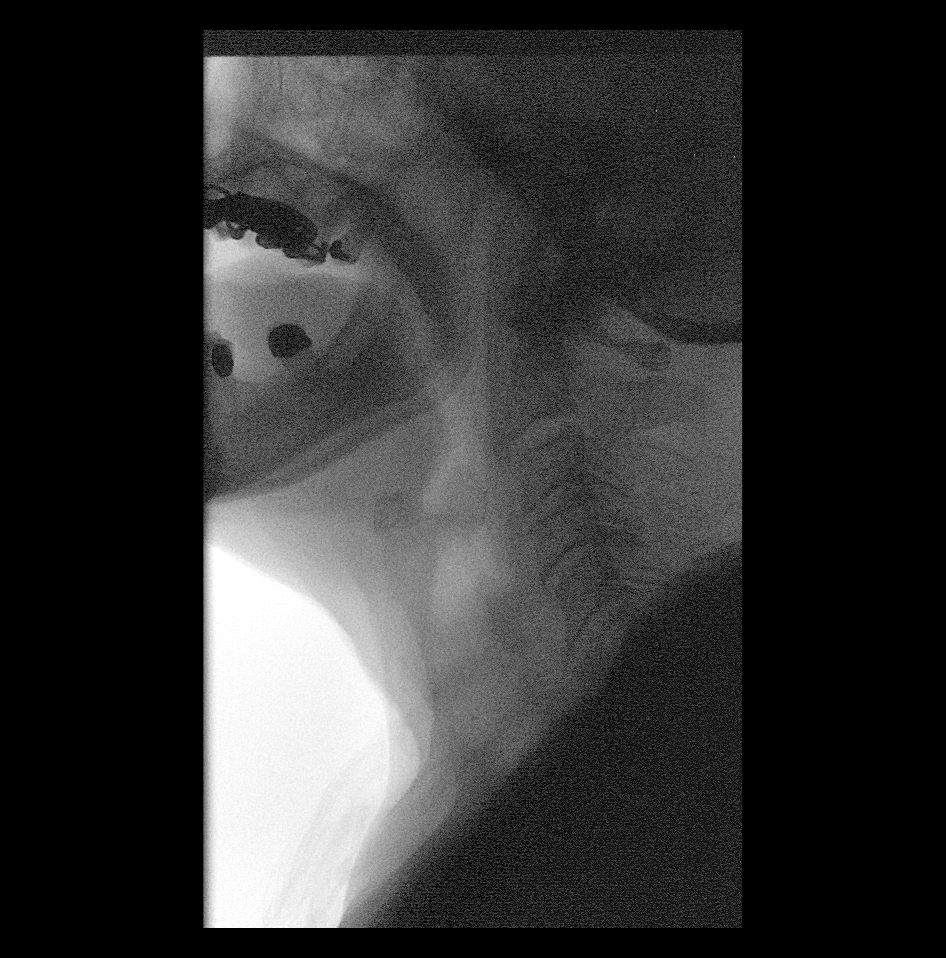

[Series 6: cp_standard · 1 of 10 frames shown (3 of 15)]
[frame 6/10]
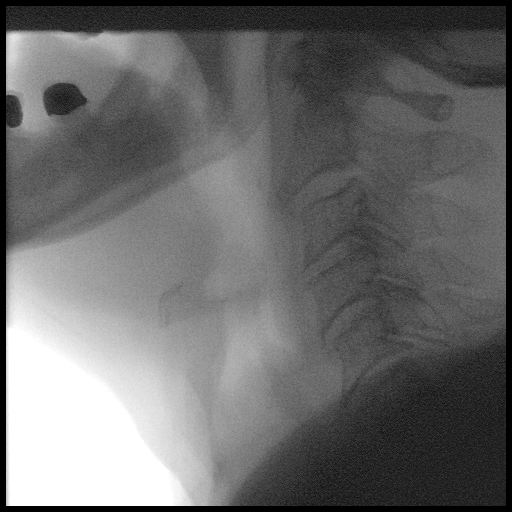

[Series 7: cp_standard · 1 of 111 frames shown (4 of 15)]
[frame 30/111]
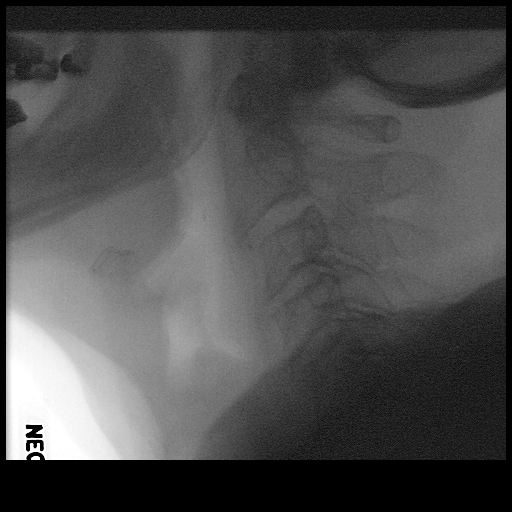

[Series 9: cp_standard · 1 of 16 frames shown (5 of 15)]
[frame 3/16]
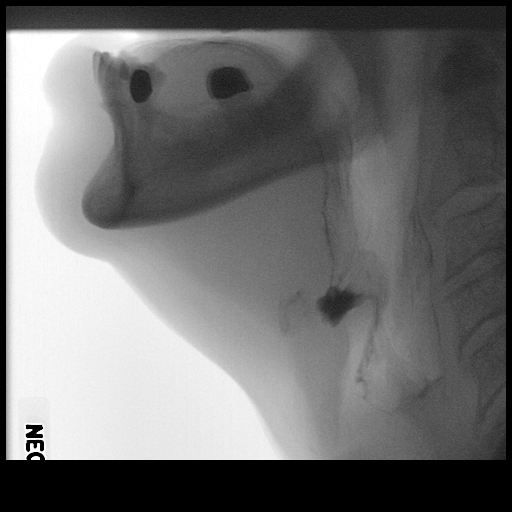

[Series 10: cp_standard · 1 of 56 frames shown (6 of 15)]
[frame 29/56]
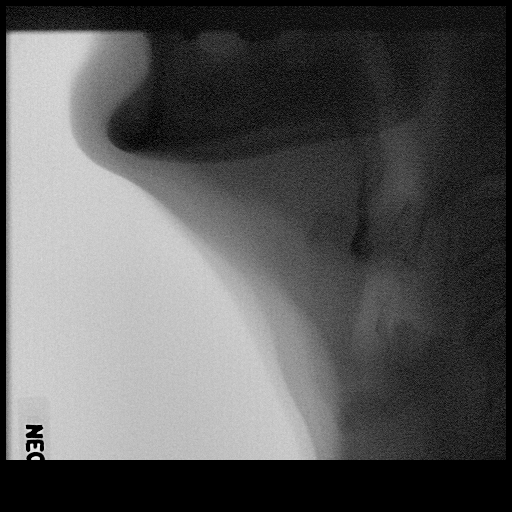

[Series 12: cp_standard · 1 of 292 frames shown (7 of 15)]
[frame 147/292]
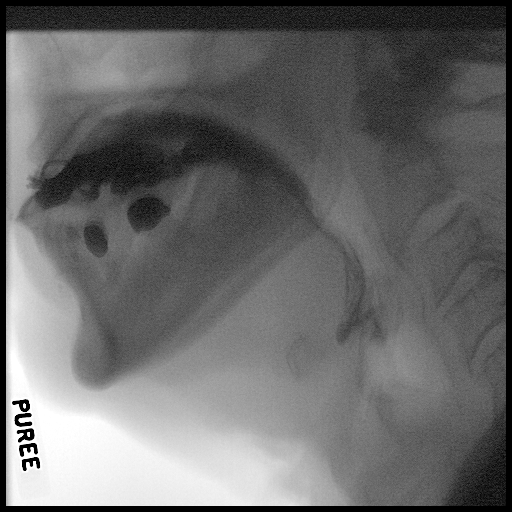

[Series 14: cp_standard · 1 of 124 frames shown (8 of 15)]
[frame 63/124]
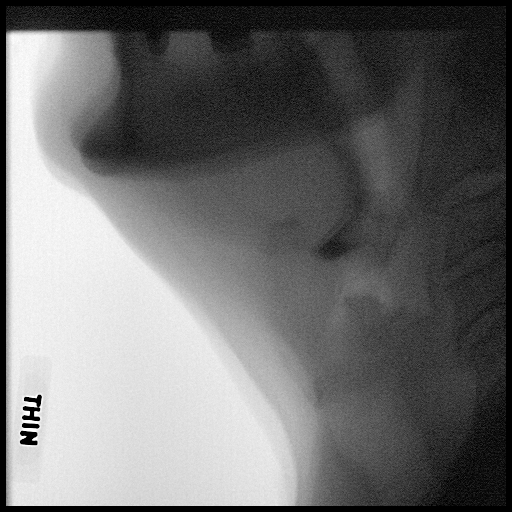

[Series 15: cp_standard · 1 of 219 frames shown (9 of 15)]
[frame 187/219]
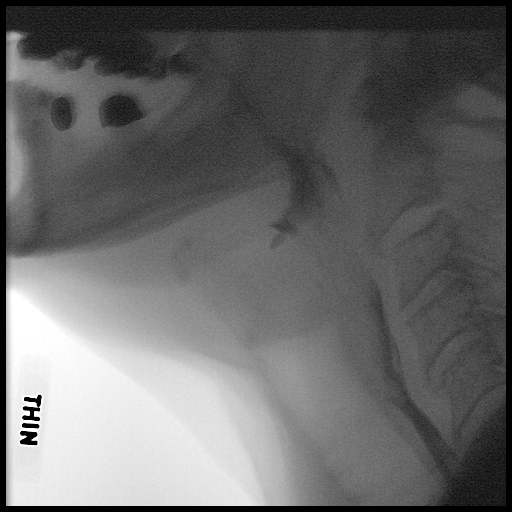

[Series 17: cp_standard · 1 of 341 frames shown (10 of 15)]
[frame 171/341]
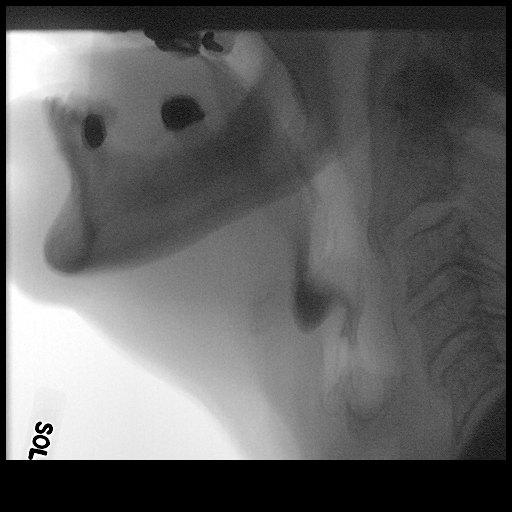

[Series 18: cp_standard · 1 of 284 frames shown (11 of 15)]
[frame 242/284]
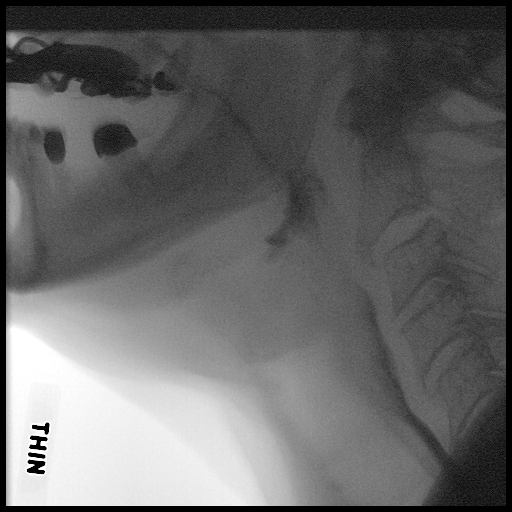

[Series 20: cp_standard · 1 of 10 frames shown (12 of 15)]
[frame 6/10]
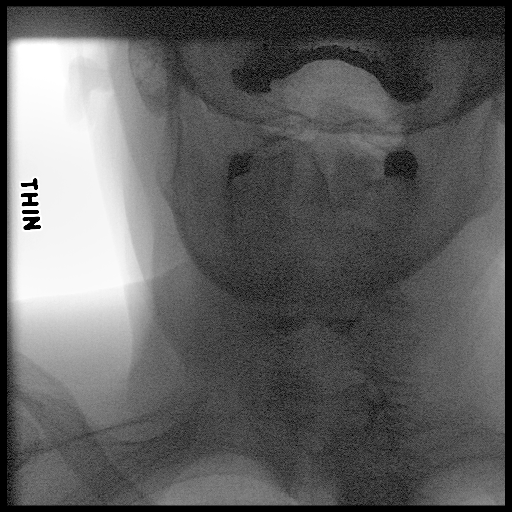

[Series 22: cp_standard · 1 of 97 frames shown (13 of 15)]
[frame 83/97]
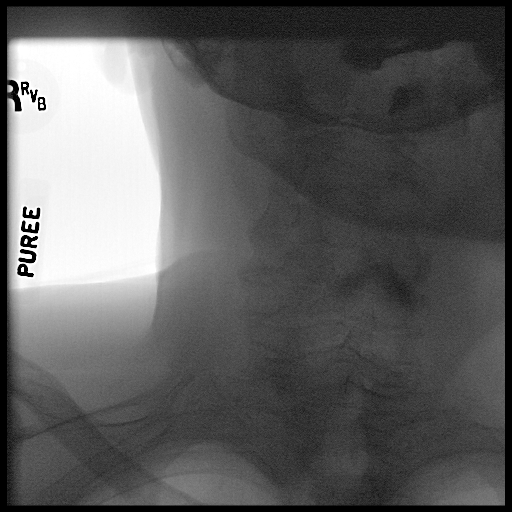

[Series 23: cp_standard · 1 of 37 frames shown (14 of 15)]
[frame 32/37]
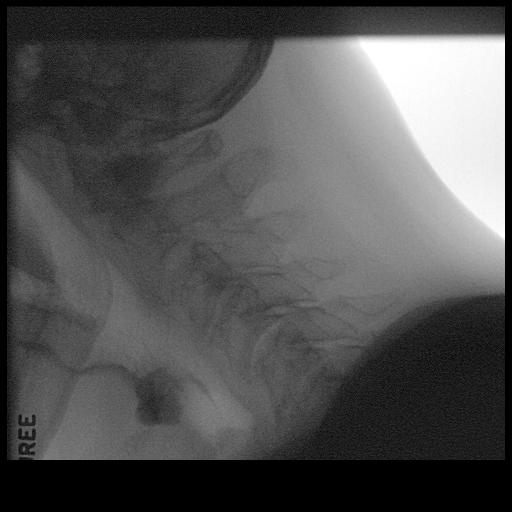

[Series 25: cp_standard · 1 of 83 frames shown (15 of 15)]
[frame 71/83]
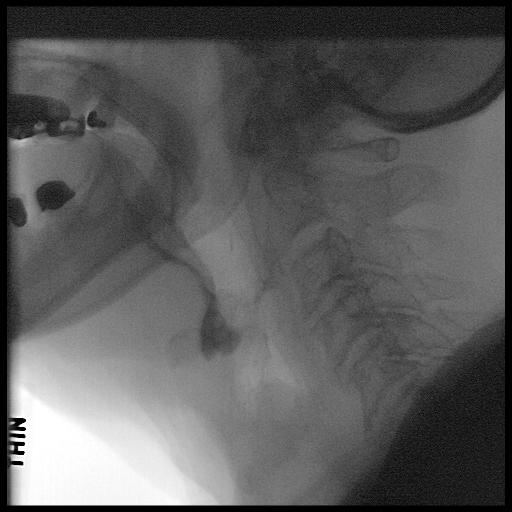

[15 of 24 positions shown; findings below may reference images not displayed]

FINDINGS: Vestibular  Penetration:  Trace

Aspiration:  Trace

Other:  Retention and  pooling at the level of the epiglottis
IMPRESSION: See speech pathology report detail.

Please refer to the Speech Pathologists report for complete details
and recommendations.

## 2023-09-21 NOTE — Telephone Encounter (Signed)
 Forms are with pcp

## 2023-09-22 ENCOUNTER — Other Ambulatory Visit (HOSPITAL_BASED_OUTPATIENT_CLINIC_OR_DEPARTMENT_OTHER): Payer: Self-pay

## 2023-09-22 ENCOUNTER — Ambulatory Visit: Admitting: Physical Therapy

## 2023-09-22 DIAGNOSIS — M47816 Spondylosis without myelopathy or radiculopathy, lumbar region: Secondary | ICD-10-CM | POA: Diagnosis not present

## 2023-09-26 NOTE — Telephone Encounter (Signed)
 Forms faxed and copies placed in scan. Pt's wife made aware form is ready for pickup

## 2023-09-27 ENCOUNTER — Encounter: Payer: Self-pay | Admitting: Physical Therapy

## 2023-09-27 ENCOUNTER — Ambulatory Visit: Admitting: Physical Therapy

## 2023-09-27 DIAGNOSIS — R296 Repeated falls: Secondary | ICD-10-CM

## 2023-09-27 DIAGNOSIS — Z9181 History of falling: Secondary | ICD-10-CM

## 2023-09-27 DIAGNOSIS — R2689 Other abnormalities of gait and mobility: Secondary | ICD-10-CM

## 2023-09-27 DIAGNOSIS — M6281 Muscle weakness (generalized): Secondary | ICD-10-CM

## 2023-09-27 DIAGNOSIS — M25512 Pain in left shoulder: Secondary | ICD-10-CM

## 2023-09-27 DIAGNOSIS — I69354 Hemiplegia and hemiparesis following cerebral infarction affecting left non-dominant side: Secondary | ICD-10-CM

## 2023-09-27 DIAGNOSIS — R262 Difficulty in walking, not elsewhere classified: Secondary | ICD-10-CM

## 2023-09-27 NOTE — Therapy (Signed)
 OUTPATIENT PHYSICAL THERAPY NEURO TREATMENT  Patient Name: William [name] MRN: [id number] DOB:12-09-1940, 83 y.o., male Today's Date: 09/27/2023  PCP: Donato Schultz  DO REFERRING PROVIDER: same   END OF SESSION:  PT End of Session - 09/27/23 1449     Visit Number [id number]    Date for PT Re-Evaluation 11/27/23    Authorization Type HTA    PT Start Time 1445    PT Stop Time 1530    PT Time Calculation (min) 45 min    Equipment Utilized During Treatment Gait belt    Activity Tolerance Patient tolerated treatment well    Behavior During Therapy WFL for tasks assessed/performed                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   HTN (hypertension) 07/19/2015   Hyperlipidemia    Major neurocognitive disorder 01/09/2014   Mild, related to stroke history   Nocturia    Renal insufficiency 06/25/2013   S/P carotid endarterectomy    BILATERAL ICA--  PATENT PER DUPLEX  05-19-2012   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/26/2017   Right calf   Urinary frequency    Vitamin D deficiency    Past Surgical History:  Procedure Laterality Date   APPENDECTOMY  AS CHILD   CARDIOVASCULAR STRESS TEST  03-27-2012  DR CRENSHAW   LOW RISK LEXISCAN STUDY-- PROBABLE NORMAL PERFUSION AND SOFT TISSUE ATTENUATION/  NO ISCHEMIA/ EF 51%   CAROTID ENDARTERECTOMY Bilateral LEFT  11-12-2008  DR GREG HAYES   RIGHT ICA  2006  ([facility])   CHOLECYSTECTOMY N/A 02/23/2022   Procedure: LAPAROSCOPIC CHOLECYSTECTOMY;  Surgeon: Quentin Ore, MD;  Location: [facility] OR;  Service:  General;  Laterality: N/A;   CYSTOSCOPY W/ RETROGRADES Bilateral 06/22/2021   Procedure: CYSTOSCOPY WITH RETROGRADE PYELOGRAM;  Surgeon: Marcine Matar, MD;  Location: [facility];  Service: Urology;  Laterality: Bilateral;   CYSTOSCOPY WITH LITHOLAPAXY N/A 02/26/2013   Procedure: CYSTOSCOPY WITH LITHOLAPAXY;  Surgeon: Marcine Matar, MD;  Location: [facility];  Service: Urology;  Laterality: N/A;   ENDOSCOPIC RETROGRADE CHOLANGIOPANCREATOGRAPHY (ERCP) WITH PROPOFOL N/A 02/22/2022   Procedure: ENDOSCOPIC RETROGRADE CHOLANGIOPANCREATOGRAPHY (ERCP) WITH PROPOFOL;  Surgeon: Jeani Hawking, MD;  Location: [facility] ENDOSCOPY;  Service: Gastroenterology;  Laterality: N/A;   EYE SURGERY  Jan. 2016   cataract surgery both eyes   INGUINAL HERNIA REPAIR Right 11-08-2006   IR KYPHO EA ADDL LEVEL THORACIC OR LUMBAR  02/12/2021   IR RADIOLOGIST EVAL & MGMT  02/18/2021   MASS EXCISION N/A 03/03/2016   Procedure: EXCISION OF BACK  MASS;  Surgeon: Almond Lint, MD;  Location: [facility];  Service: General;  Laterality: N/A;   MOHS SURGERY Left 1/ 2016   Dr Margo Aye-- Basal cell   PROSTATE SURGERY     REMOVAL OF STONES  02/22/2022   Procedure: REMOVAL OF STONES;  Surgeon: Jeani Hawking, MD;  Location: [facility] ENDOSCOPY;  Service: Gastroenterology;;   Dennison Mascot  02/22/2022  Procedure: SPHINCTEROTOMY;  Surgeon: Jeani Hawking, MD;  Location: [facility] ENDOSCOPY;  Service: Gastroenterology;;   TRANSURETHRAL RESECTION OF BLADDER TUMOR WITH MITOMYCIN-C N/A 06/22/2021   Procedure: TRANSURETHRAL RESECTION OF BLADDER TUMOR;  Surgeon: Marcine Matar, MD;  Location: [facility];  Service: Urology;  Laterality: N/A;   TRANSURETHRAL RESECTION OF PROSTATE N/A 02/26/2013   Procedure: TRANSURETHRAL RESECTION OF THE PROSTATE WITH GYRUS INSTRUMENTS;  Surgeon: Marcine Matar, MD;  Location: [facility];  Service: Urology;  Laterality: N/A;   TRANSURETHRAL RESECTION OF  PROSTATE N/A 06/22/2021   Procedure: TRANSURETHRAL RESECTION OF THE PROSTATE (TURP);  Surgeon: Marcine Matar, MD;  Location: [name];  Service: Urology;  Laterality: N/A;   Patient Active Problem List   Diagnosis Date Noted   Bronchitis 06/16/2023   Impacted cerumen of left ear 06/16/2023   Acute encephalopathy 04/18/2023   Parkinson's disease (HCC) 04/18/2023   History of CVA with residual deficit 04/18/2023   Chronic kidney disease, stage 3b (HCC) 04/18/2023   Dementia with behavioral disturbance (HCC) 04/18/2023   Anemia of chronic disease 04/18/2023   Laceration of right upper extremity 02/24/2023   Dermatitis 02/24/2023   Lung nodules 02/24/2023   Dysuria 08/03/2022   Nonintractable headache 07/01/2022   Bilateral impacted cerumen 06/24/2022   Rash 06/15/2022   Postoperative ileus (HCC) 02/27/2022   Ileus, postoperative (HCC) 02/26/2022   Choledocholithiasis 02/19/2022   DNR (do not resuscitate) 02/19/2022   Left elbow pain 01/26/2022   Mid back pain on left side 01/26/2022   Rib pain 01/26/2022   Acute pain of left shoulder 11/12/2021   Leukocytes in urine 11/12/2021   Urinary frequency 11/12/2021   Thrush 10/08/2021   Hemiplegia, 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   Insomnia    Prediabetes    Acute 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 (HCC)    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   Transaminitis 07/27/2021   UTI (urinary tract infection) 07/27/2021   CVA (cerebral vascular accident) (HCC) 07/27/2021   Fall 07/27/2021   Hyperglycemia 07/27/2021   Cholelithiasis 07/27/2021   Hypoxia 07/27/2021   Nausea and vomiting 07/27/2021   Acute metabolic encephalopathy 07/27/2021   Normocytic anemia 07/27/2021   Chronic back pain 07/27/2021   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   Closed fracture of first lumbar vertebra with routine healing 02/03/2021   Closed  fracture of multiple ribs 11/18/2020   Anxiety 01/29/2020   Leg pain, bilateral 01/29/2020   Ingrown toenail 07/13/2019   Lumbar spondylosis 05/02/2018   Pain in left knee 03/09/2018   Osteoarthritis of left hip 01/16/2018   Trochanteric bursitis of left hip 01/16/2018   Preventative health care 09/26/2017   HTN (hypertension) 07/19/2015   Hyperlipidemia 07/19/2015   Great toe pain 02/11/2014   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   Obesity (BMI 30-39.9) 06/25/2013   Renal insufficiency 06/25/2013   Weakness of left arm 06/25/2013   Sebaceous cyst 03/03/2011   Sprain of lumbar region 07/31/2010   Rib pain, left 08/29/2009   Carotid artery stenosis, asymptomatic, bilateral 05/02/2009   Eczema, atopic 05/31/2008   Vitamin D deficiency 03/01/2008   BPH (benign prostatic hyperplasia) 08/06/2007   Fasting hyperglycemia 12/21/2006   History of right MCA infarct 2006    ONSET DATE: 06/29/22 REFERRING DIAG:  Diagnosis  I63.9 (ICD-10-CM) - Cerebrovascular accident (CVA), unspecified mechanism (HCC)  W19.XXXD (ICD-10-CM) - Fall, subsequent encounter    THERAPY DIAG:  Muscle weakness (  generalized)  Other abnormalities of gait and mobility  Hemiplegia and hemiparesis following cerebral infarction affecting left non-dominant side (HCC)  Difficulty in walking, not elsewhere classified  Repeated falls  Acute pain of left shoulder  History of falling  Rationale for Evaluation and Treatment: Rehabilitation  SUBJECTIVE:                                                                                                                                                                                         SUBJECTIVE STATEMENT:  Patient reports that he had a fall, wife reports that his legs just gave out and he went down, denies any injury  PERTINENT HISTORY:  William [name] is a 83 year old man with dementia, CKD, HTN, CAD, HLD and history of CVA  (2006, 2023),  Parkinson's  PAIN:  Are you having pain? Faces/behavior scale- mild pain BLEs  and back   PRECAUTIONS: Fall  WEIGHT BEARING RESTRICTIONS: No  FALLS: Has patient fallen in last 6 months? No  LIVING ENVIRONMENT: Lives with: lives with their family and lives with their spouse Lives in: House/apartment Stairs:  1 brick high step Has following equipment at home: Environmental consultant - 2 wheeled, Wheelchair (manual), Shower bench, bed side commode, Grab bars, and hospital bed, sliding pad for car  PLOF: Independent with basic ADLs and Independent with household mobility without device  PATIENT GOALS: Walk around the block with his wife. Be able to use the dining room chair rather than have to use the W/C. Increased I with bed mobility and simple tasks at home like make some coffee or brush his teeth, Step over tub to shower, has bench, but he can't really use it. Up and down the one step to get to back deck. 11/23/22: Patient's wife William [name] updated his goals today: Walk around store (gets distracted), swing a golf club again (thinking of driving range), get up on his own, walk without leaning forward, more recognition of the left side (from CVA), not need gait belt, OT-- be able to get dressed more on his own, navigate the newspaper.  OBJECTIVE:  TREATMENT: 09/27/23 Nustep level 5 x 6 minutes Bike level 4 x 6 minutes Seated volleyball Standing volleyball Partial sit ups TUG 27 seconds but needed ModA due to a LOB, the second attempt he did in 19 seconds but uncontrolled sitting Supine bridges 2# flexion and chest press with wate bar Supine ball reaching Supine to sit with min A gait with him out to the car   09/13/23 Nustep level 5 x 7 mintues Bike level 4 x 6 minutes Gait outside with HHA/CGA 250' x 2 Supine bridges Isometric  abs Left leg abduction Supine to sitting with min A Seated volley ball Seated ball kicks  09/06/23 Home assessment with wife: Bathroom set up is very cramped and  crowded.  Has over commode toilet and grab bars, they have a walker in front of the commode, the way it is set up he has to walk side ways into the set up and side ways out, I lowered the front legs and put them in the shower that is directly in front and had the back legs out, this gave him about 10" more to get b/n the toilet and the walker so he could walk in and then turn instead of side ways. Bed transfer from supine:  We did this 4 times, dropped the side rail, raised head of the bed and wife typically uses a strap that she gives William [name] and he pulls up with it.  Today he got up without it twice, we talked about protecting William [name]/s back with leaning back and having a wide base of support, also to give William [name] cues to lean forward as he tends to be retropulsive.   Shower:  William [name] in and looked at this with William [name], gave some ideas as he is now holding grab bar and stepping over the tub to get in.  Asked about safer way of sitting and bringing legs over she said this was tried but they were unable to get his legs over. Went over HEP:  in his chair talked about reciprocal patterns and she looked on [place] for a pedal machine Gait: outside with HHA to the mail box and back cues for step length  08/25/23 Nustep level 5 x 6 minutes, cues for larger movements Bike level 4 x 6 minutes UBE level 3 x 5 mintues Worked on some bed mobility Bridges Partial sit ups Supine left leg sliding board hip abduction Seated volley ball with some cues to use the left hand Gait outside with hand hold assist and some cues for steps, some walking in grass  08/18/23 Nustep level 5 x 9 minutes some cues for larger motions Bike level 5 x 6 minutes UBE level 4 x 4 minutes Sitting on the mat table elbow touches a lot of cues to come to the left side Partial sit ups 3x10 Bridges 3x10 Hooklying marches Seated ball taps left hand and right hand Supine to sit x 2 with min A Gait with HHA 2 x 220 feet fast pace, cues for BIG  steps  08/11/23  Nustep L5 all four extremities x8 minutes Mod cues for good machine use Lateral trunk crunches (limited on the L due to shoulder pain) with light ball toss in between x10 B  STS no UEs up to tip toes to help counter posterior lean when first standing up x10, up to MinA as fatigue increased  Tried double march in sitting with transition to march in standing but this was a bit overwhelming for him Alternating toe taps on 4 inch step progressing to 8 inch step with intermittent cues to focus on task at hand  Forward steps to target to promote improved step length, Mod multimodal cues  Light L shoulder stretching for flexion to tolerance   07/26/23 Getting in and out of car with him due to rain, he struggled due to a lot going on and it seemed to fluster him some Nustep level 5 x 7 minutes UBE level 3 x 6 minutes Supine bridges Partial sit ups Standing ball toss and volleyball some cues for  left hand Gait with HHA x 3 laps Practice with him and wife getting in and out of bed, x3  07/21/23 Gait outside with HHA on the right negotiating curbs, cues for step length and posture, around to the front door,  Nustep level 5 x 7 minutes 2.5# LAQ 2.5# [employer] with wife regarding renewal and what is going on with [place], she reports that in the morning he freezes when trying to go to the bathroom, and cannot sit, she also reports that she is using Max A to get him from supine to sit.  We practiced this with [place] in the bathroom and today he had no big issues of freezing. I started some education with wife on some physics of levers and fulcrums of trying to get him out of bed from supine Pball press for core work Retested [place] and 5XSTS  07/14/23 Nustep level 5 x 7 minutes UBE level 2 x 4 minutes Passive stretch hand, fingers, wrist, elbow and shoulder left Standing step turn reach touch Standing reaching for numbers on the wall with left hand and right Sit to stand with cues for  both arms to push up. Gait outside with HHA 200 feet then rest and then 200 feet through some grass and uneven terrain to the car  PATIENT EDUCATION: Education details: none new 02/16/23 Person educated: Patient and Spouse Education method: Explanation, Demonstration, Tactile cues, and Verbal cues Education comprehension: verbalized understanding, returned demonstration, verbal cues required, tactile cues required, and needs further education   ------------------------------------------------------------------------------------------------------- (Measures in this section from initial evaluation unless otherwise noted) DIAGNOSTIC FINDINGS:  MRI 2021 with degenerative changes in lumbar spine, L3-4 R subarticular stenosis COGNITION: Overall cognitive status: History of cognitive impairments - at baseline SENSATION: Not tested COORDINATION: Moderately impaired BLE MUSCLE LENGTH: Hamstrings: severely restricted B Thomas test: Severely restricted B. POSTURE: rounded shoulders, forward head, increased thoracic kyphosis, posterior pelvic tilt, and flexed trunk  LOWER EXTREMITY ROM:   BLE extremely stiff throughout, limited hip ROM in all planes, B knees limited in extension. LOWER EXTREMITY MMT:  3+/5 throughout. Unable to determine accuracy of MMT due to cognition. Functionally demosntrates poor coordinated activation and poor muscular endurance. BED MOBILITY: Min A for Supine<> Sit. Patient was using a ladder in his hospital bed and could move MI. TRANSFERS: Min A, mod VC, tends to lean back.  11/02/22 independent with use of hands on arm chair with a couple of tries CURB: Min A-Has one step out to his deck. STAIRS: N/A  GAIT: Gait pattern: step to pattern, decreased arm swing- Right, decreased arm swing- Left, decreased step length- Right, decreased step length- Left, shuffling, festinating, trunk flexed, narrow BOS, poor foot clearance- Right, and poor foot clearance- Left Distance walked:  72' Assistive device utilized: Environmental consultant - 2 wheeled Level of assistance: Occasional min A Comments: mod VC to stay close to walker, take longer steps. FUNCTIONAL TESTS:  5 times sit to stand: 38.23  09/29/22  could not performs 5XSTS, 11/02/22 = 47 seconds with cues to use arms, 07/21/23 really struggled today 55 seconds Timed up and go (TUG): 47.91  with RW, 07/21/22 TUG 29  seconds without device, 09/29/22 25 seconds, 11/02/22 TUG 20 seconds, 07/21/23 = 26 seconds no device SBA  PATIENT EDUCATION: Education details: Progress towards goals, POC Person educated: Patient and Spouse Education method: Explanation, Demonstration, and Verbal cues  Education comprehension: verbalized understanding, returned demonstration, verbal cues required, and needs further education  HOME EXERCISE PROGRAM: PWR! moves  GOALS: Goals reviewed with patient? Yes  SHORT TERM GOALS: Target date: 12/22/22  Perform HEP for PWR! Moves with cues from family. Baseline: Pt's wife, William [name], notes walking and hitting beach ball as main HEP. Goal status: GOAL MET, 12/20/2022   2. Decrease 5 x STS to < 35 sec to demonstrate improved functional strength  Baseline: 47 seconds on 5/21 per note; 34.5 sec UE support 12/20/2022  Goal status: GOAL MET, 12/20/2022   3. Perform sit <> stand transfers from average chair height with Supervision.  Baseline: CGA  12/20/2022; (01/10/23) CGA  Goal status: NOT MET  LONG TERM GOALS: Target date: 04/22/23  Patient will perform HEP progression with caregiver assist. Baseline: has minimal HEP Goal status: progressing  2.  Decrease 5 x STS to < 25 sec to demonstrate improved functional strength Baseline: 47 seconds on 5/21 per note; (01/10/23) 36 sec w/ cues for speed/sequence>30.38 sec with BUE supprot Goal status: PARTIALLY MET, 01/24/2023  3. Improve quality of gait to be able to ambulate x 100 feet with supervision. Baseline: CGA  with cues  Goal status: Met 08/18/23  4.  Patient will perform his bed mobility with MI including rolling, sit<>supine, and scooting in bed. Baseline: on hard surface mat he is independent but on soft bed at home he struggles, wife reports only needs a hand to get up now 11/02/22; (01/10/23) modified indep.  Goal status: MET  5.  Patient will demonstrate floor<>stand with CGA and verbal cues for patient's wife to be able to assist. Baseline:  Has to call 9-1-1; +2 max assist floor>stand with UE support 01/19/2023 Goal status: GOAL NOT MET 01/19/2023  **UPDATED GOALS BELOW ** per recert 05/17/2023  SHORT TERM GOALS: Target date: 02/25/2023   Pt will perform HEP to maintain lower body strength, flexibility, posture with wife's assistance. Baseline:  Goal status: met 04/12/23  2. Pt will perform sit to stand, 8 of 10 reps with BUE support, with supervision, to maintain safety with transfers.  Baseline: min guard  Goal status: 02/07/23-CGA from elevated surface and max VC to lean forward, ongoing   LONG TERM GOALS: Target date: 07/18/23   Patient will perform HEP progression to maintain lower body strength, flexibility, posture with wife's assistance. Baseline:  Goal status: ongoing but progressing 08/25/23  2.  Pt will ambulate 150-175 ft in 2 MWT with min guard assist, to preserve functional mobility within the home.  Baseline: Goal status:  met 08/25/23  3. Wife will verbalize tips for fall prevention education to minimize fall frequency Baseline:  Goal status:progressing and doing well 4.  Patient will be able to toilet without freezing for over a 4 week period 5.  Patient will be able to get out of bed from supine with wife assist minimal Assist, will see him at his house next week 08/25/23   ASSESSMENT:  CLINICAL IMPRESSION:  Patient had another fall, reports that his legs gave out.  I talked with him and his wife regarding continuation of PT vs  trying at home, the wife reports that  when he misses PT or does not come in she really feels like he regresses with his strength, she feels that he has lost weight and really needs to stay active and this makes him  come and do.  His TUG time actually was better but on the first attempt needed Mod A to not fall due to LOB. OBJECTIVE IMPAIRMENTS: Abnormal gait, decreased activity tolerance, decreased balance, decreased cognition, decreased coordination, decreased endurance, decreased mobility, difficulty walking, decreased ROM, decreased strength, decreased safety awareness, impaired flexibility, impaired UE functional use, improper body mechanics, and postural dysfunction.   PLAN:  PT FREQUENCY: 1x/week  PT DURATION: 12 weeks  PLANNED INTERVENTIONS: Therapeutic exercises, Therapeutic activity, Neuromuscular re-education, Balance training, Gait training, Patient/Family education, Self Care, Joint mobilization, Stair training, Moist heat, and Manual therapy  PLAN FOR NEXT SESSION:   work on core and reciprocal patterns Patient Details  Name: MONTAGUE CORELLA MRN: [id number] Date of Birth: [birth year] Referring Provider:  Crecencio Dodge, Candida Chalk, *  Cherylene Corrente, PT

## 2023-09-28 ENCOUNTER — Other Ambulatory Visit (HOSPITAL_BASED_OUTPATIENT_CLINIC_OR_DEPARTMENT_OTHER): Payer: Self-pay

## 2023-09-28 ENCOUNTER — Other Ambulatory Visit (HOSPITAL_COMMUNITY): Payer: Self-pay | Admitting: *Deleted

## 2023-09-28 MED ORDER — CARBAMAZEPINE 100 MG/5ML PO SUSP
ORAL | 3 refills | Status: DC
  Filled 2023-09-28 – 2023-10-25 (×4): qty 600, 30d supply, fill #0

## 2023-09-30 ENCOUNTER — Other Ambulatory Visit (HOSPITAL_BASED_OUTPATIENT_CLINIC_OR_DEPARTMENT_OTHER): Payer: Self-pay

## 2023-10-03 ENCOUNTER — Other Ambulatory Visit (HOSPITAL_BASED_OUTPATIENT_CLINIC_OR_DEPARTMENT_OTHER): Payer: Self-pay

## 2023-10-03 ENCOUNTER — Telehealth: Payer: Self-pay | Admitting: Family Medicine

## 2023-10-03 ENCOUNTER — Other Ambulatory Visit: Payer: Self-pay

## 2023-10-03 NOTE — Telephone Encounter (Signed)
 Pt's spouse Casmer Yepiz) dropped off document to be filled out by provider (for the 2nd time- Health Medical Examination Report- [employer]- 2  pages- in a yellow folder) Pt's spouse would like to be called when document ready for pick up. Pt tel [phone]. Document put at front office tray under providers name.

## 2023-10-04 NOTE — Telephone Encounter (Signed)
 Form completed. Pt's wife made aware. Copy sent to scan. Original placed at the front for pick up

## 2023-10-05 ENCOUNTER — Other Ambulatory Visit: Payer: Self-pay

## 2023-10-06 ENCOUNTER — Encounter: Payer: Self-pay | Admitting: Physical Therapy

## 2023-10-06 ENCOUNTER — Ambulatory Visit: Admitting: Physical Therapy

## 2023-10-06 DIAGNOSIS — R262 Difficulty in walking, not elsewhere classified: Secondary | ICD-10-CM

## 2023-10-06 DIAGNOSIS — M6281 Muscle weakness (generalized): Secondary | ICD-10-CM | POA: Diagnosis not present

## 2023-10-06 DIAGNOSIS — Z9181 History of falling: Secondary | ICD-10-CM

## 2023-10-06 DIAGNOSIS — R296 Repeated falls: Secondary | ICD-10-CM

## 2023-10-06 DIAGNOSIS — I69354 Hemiplegia and hemiparesis following cerebral infarction affecting left non-dominant side: Secondary | ICD-10-CM

## 2023-10-06 DIAGNOSIS — M25512 Pain in left shoulder: Secondary | ICD-10-CM

## 2023-10-06 DIAGNOSIS — R2689 Other abnormalities of gait and mobility: Secondary | ICD-10-CM

## 2023-10-06 NOTE — Therapy (Signed)
 OUTPATIENT PHYSICAL THERAPY NEURO TREATMENT  Patient Name: William [name] MRN: [id number] DOB:Mar 20, 1941, 83 y.o., male Today's Date: 10/06/2023  PCP: Estill Hemming  DO REFERRING PROVIDER: same   END OF SESSION:  PT End of Session - 10/06/23 1454     Visit Number [id number]    Date for PT Re-Evaluation 11/27/23    Authorization Type HTA    PT Start Time 1445    PT Stop Time 1530    PT Time Calculation (min) 45 min    Equipment Utilized During Treatment Gait belt    Activity Tolerance Patient tolerated treatment well    Behavior During Therapy WFL for tasks assessed/performed                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   HTN (hypertension) 07/19/2015   Hyperlipidemia    Major neurocognitive disorder 01/09/2014   Mild, related to stroke history   Nocturia    Renal insufficiency 06/25/2013   S/P carotid endarterectomy    BILATERAL ICA--  PATENT PER DUPLEX  05-19-2012   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 09/26/2017   Right calf   Urinary frequency    Vitamin D  deficiency    Past Surgical History:  Procedure Laterality Date   APPENDECTOMY  AS CHILD   CARDIOVASCULAR STRESS TEST  03-27-2012  DR CRENSHAW   LOW RISK LEXISCAN  STUDY-- PROBABLE NORMAL PERFUSION AND SOFT TISSUE ATTENUATION/  NO ISCHEMIA/ EF 51%   CAROTID ENDARTERECTOMY Bilateral LEFT  11-12-2008  DR GREG HAYES   RIGHT ICA  2006  ([facility])   CHOLECYSTECTOMY N/A 02/23/2022   Procedure: LAPAROSCOPIC CHOLECYSTECTOMY;  Surgeon: Junie Olds, MD;  Location: [facility] OR;  Service:  General;  Laterality: N/A;   CYSTOSCOPY W/ RETROGRADES Bilateral 06/22/2021   Procedure: CYSTOSCOPY WITH RETROGRADE PYELOGRAM;  Surgeon: Trent Frizzle, MD;  Location: [facility];  Service: Urology;  Laterality: Bilateral;   CYSTOSCOPY WITH LITHOLAPAXY N/A 02/26/2013   Procedure: CYSTOSCOPY WITH LITHOLAPAXY;  Surgeon: Trent Frizzle, MD;  Location: [facility];  Service: Urology;  Laterality: N/A;   ENDOSCOPIC RETROGRADE CHOLANGIOPANCREATOGRAPHY (ERCP) WITH PROPOFOL  N/A 02/22/2022   Procedure: ENDOSCOPIC RETROGRADE CHOLANGIOPANCREATOGRAPHY (ERCP) WITH PROPOFOL ;  Surgeon: Alvis Jourdain, MD;  Location: [facility] ENDOSCOPY;  Service: Gastroenterology;  Laterality: N/A;   EYE SURGERY  Jan. 2016   cataract surgery both eyes   INGUINAL HERNIA REPAIR Right 11-08-2006   IR KYPHO EA ADDL LEVEL THORACIC OR LUMBAR  02/12/2021   IR RADIOLOGIST EVAL & MGMT  02/18/2021   MASS EXCISION N/A 03/03/2016   Procedure: EXCISION OF BACK  MASS;  Surgeon: Lockie Rima, MD;  Location: [facility];  Service: General;  Laterality: N/A;   MOHS SURGERY Left 1/ 2016   Dr Del Favia-- Basal cell   PROSTATE SURGERY     REMOVAL OF STONES  02/22/2022   Procedure: REMOVAL OF STONES;  Surgeon: Alvis Jourdain, MD;  Location: [facility] ENDOSCOPY;  Service: Gastroenterology;;   [address]  02/22/2022  Procedure: SPHINCTEROTOMY;  Surgeon: Alvis Jourdain, MD;  Location: [facility] ENDOSCOPY;  Service: Gastroenterology;;   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 N/A 06/22/2021   Procedure: TRANSURETHRAL RESECTION OF BLADDER TUMOR;  Surgeon: Trent Frizzle, MD;  Location: [facility];  Service: Urology;  Laterality: N/A;   TRANSURETHRAL RESECTION OF PROSTATE N/A 02/26/2013   Procedure: TRANSURETHRAL RESECTION OF THE PROSTATE WITH GYRUS INSTRUMENTS;  Surgeon: Trent Frizzle, MD;  Location: [facility];  Service: Urology;  Laterality: N/A;   TRANSURETHRAL RESECTION OF  PROSTATE N/A 06/22/2021   Procedure: TRANSURETHRAL RESECTION OF THE PROSTATE (TURP);  Surgeon: Trent Frizzle, MD;  Location: [facility];  Service: Urology;  Laterality: N/A;   Patient Active Problem List   Diagnosis Date Noted   Bronchitis 06/16/2023   Impacted cerumen of left ear 06/16/2023   Acute encephalopathy 04/18/2023   Parkinson's disease (HCC) 04/18/2023   History of CVA with residual deficit 04/18/2023   Chronic kidney disease, stage 3b (HCC) 04/18/2023   Dementia with behavioral disturbance (HCC) 04/18/2023   Anemia of chronic disease 04/18/2023   Laceration of right upper extremity 02/24/2023   Dermatitis 02/24/2023   Lung nodules 02/24/2023   Dysuria 08/03/2022   Nonintractable headache 07/01/2022   Bilateral impacted cerumen 06/24/2022   Rash 06/15/2022   Postoperative ileus (HCC) 02/27/2022   Ileus, postoperative (HCC) 02/26/2022   Choledocholithiasis 02/19/2022   DNR (do not resuscitate) 02/19/2022   Left elbow pain 01/26/2022   Mid back pain on left side 01/26/2022   Rib pain 01/26/2022   Acute pain of left shoulder 11/12/2021   Leukocytes in urine 11/12/2021   Urinary frequency 11/12/2021   Thrush 10/08/2021   Hemiplegia, 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   Insomnia    Prediabetes    Acute 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 (HCC)    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   Transaminitis 07/27/2021   UTI (urinary tract infection) 07/27/2021   CVA (cerebral vascular accident) (HCC) 07/27/2021   Fall 07/27/2021   Hyperglycemia 07/27/2021   Cholelithiasis 07/27/2021   Hypoxia 07/27/2021   Nausea and vomiting 07/27/2021   Acute metabolic encephalopathy 07/27/2021   Normocytic anemia 07/27/2021   Chronic back pain 07/27/2021   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   Closed fracture of first lumbar vertebra with routine healing 02/03/2021   Closed  fracture of multiple ribs 11/18/2020   Anxiety 01/29/2020   Leg pain, bilateral 01/29/2020   Ingrown toenail 07/13/2019   Lumbar spondylosis 05/02/2018   Pain in left knee 03/09/2018   Osteoarthritis of left hip 01/16/2018   Trochanteric bursitis of left hip 01/16/2018   Preventative health care 09/26/2017   HTN (hypertension) 07/19/2015   Hyperlipidemia 07/19/2015   Great toe pain 02/11/2014   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   Obesity (BMI 30-39.9) 06/25/2013   Renal insufficiency 06/25/2013   Weakness of left arm 06/25/2013   Sebaceous cyst 03/03/2011   Sprain of lumbar region 07/31/2010   Rib pain, left 08/29/2009   Carotid artery stenosis, asymptomatic, bilateral 05/02/2009   Eczema, atopic 05/31/2008   Vitamin D  deficiency 03/01/2008   BPH (benign prostatic hyperplasia) 08/06/2007   Fasting hyperglycemia 12/21/2006   History of right MCA infarct 2006    ONSET DATE: 06/29/22 REFERRING DIAG:  Diagnosis  I63.9 (ICD-10-CM) - Cerebrovascular accident (CVA), unspecified mechanism (HCC)  W19.XXXD (ICD-10-CM) - Fall, subsequent encounter    THERAPY DIAG:  Muscle weakness (  generalized)  Other abnormalities of gait and mobility  Hemiplegia and hemiparesis following cerebral infarction affecting left non-dominant side (HCC)  Difficulty in walking, not elsewhere classified  Repeated falls  Acute pain of left shoulder  History of falling  Rationale for Evaluation and Treatment: Rehabilitation  SUBJECTIVE:                                                                                                                                                                                         SUBJECTIVE STATEMENT:  No falls, wife reports that his legs have seemed weak lately, wife wonders if it is from doing the pedal bike at home  PERTINENT HISTORY:  MASAJI BILLUPS is a 83 year old man with dementia, CKD, HTN, CAD, HLD and history of CVA  (2006, 2023),  Parkinson's  PAIN:  Are you having pain? Faces/behavior scale- mild pain BLEs  and back   PRECAUTIONS: Fall  WEIGHT BEARING RESTRICTIONS: No  FALLS: Has patient fallen in last 6 months? No  LIVING ENVIRONMENT: Lives with: lives with their family and lives with their spouse Lives in: House/apartment Stairs:  1 brick high step Has following equipment at home: Environmental consultant - 2 wheeled, Wheelchair (manual), Shower bench, bed side commode, Grab bars, and hospital bed, sliding pad for car  PLOF: Independent with basic ADLs and Independent with household mobility without device  PATIENT GOALS: Walk around the block with his wife. Be able to use the dining room chair rather than have to use the W/C. Increased I with bed mobility and simple tasks at home like make some coffee or brush his teeth, Step over tub to shower, has bench, but he can't really use it. Up and down the one step to get to back deck. 11/23/22: Patient's wife Amalia Badder updated his goals today: Walk around store (gets distracted), swing a golf club again (thinking of driving range), get up on his own, walk without leaning forward, more recognition of the left side (from CVA), not need gait belt, OT-- be able to get dressed more on his own, navigate the newspaper.  OBJECTIVE:  TREATMENT: 10/06/23 Bike level 4 x 5 minutes Nustep level 5 x 5 minutes 3# LAQ 3# marches Seated volley ball Passive stretch left shoulder Partial sit ups On airex reaching for numbers on wall, trying to get him to use the left arm as well Gait outside with HHA half way around the parking [place] a short break and then walking in the grass from the front of the building to the car  09/27/23 Nustep level 5 x 6 minutes Bike level 4 x 6 minutes Seated volleyball Standing volleyball  Partial sit ups TUG 27 seconds but needed ModA due to a LOB, the second attempt he did in 19 seconds but uncontrolled sitting Supine bridges 2# flexion and chest press with wate  bar Supine ball reaching Supine to sit with min A gait with him out to the car   09/13/23 Nustep level 5 x 7 mintues Bike level 4 x 6 minutes Gait outside with HHA/CGA 250' x 2 Supine bridges Isometric abs Left leg abduction Supine to sitting with min A Seated volley ball Seated ball kicks  09/06/23 Home assessment with wife: Bathroom set up is very cramped and crowded.  Has over commode toilet and grab bars, they have a walker in front of the commode, the way it is set up he has to walk side ways into the set up and side ways out, I lowered the front legs and put them in the shower that is directly in front and had the back legs out, this gave him about 10" more to get b/n the toilet and the walker so he could walk in and then turn instead of side ways. Bed transfer from supine:  We did this 4 times, dropped the side rail, raised head of the bed and wife typically uses a strap that she gives [place] and he pulls up with it.  Today he got up without it twice, we talked about protecting [name]/s back with leaning back and having a wide base of support, also to give [place] cues to lean forward as he tends to be retropulsive.   Shower:  Shelah Derry in and looked at this with Amalia Badder, gave some ideas as he is now holding grab bar and stepping over the tub to get in.  Asked about safer way of sitting and bringing legs over she said this was tried but they were unable to get his legs over. Went over HEP:  in his chair talked about reciprocal patterns and she looked on [place] for a pedal machine Gait: outside with HHA to the mail box and back cues for step length  08/25/23 Nustep level 5 x 6 minutes, cues for larger movements Bike level 4 x 6 minutes UBE level 3 x 5 mintues Worked on some bed mobility Bridges Partial sit ups Supine left leg sliding board hip abduction Seated volley ball with some cues to use the left hand Gait outside with hand hold assist and some cues for steps, some walking in  grass  08/18/23 Nustep level 5 x 9 minutes some cues for larger motions Bike level 5 x 6 minutes UBE level 4 x 4 minutes Sitting on the mat table elbow touches a lot of cues to come to the left side Partial sit ups 3x10 Bridges 3x10 Hooklying marches Seated ball taps left hand and right hand Supine to sit x 2 with min A Gait with HHA 2 x 220 feet fast pace, cues for BIG steps  08/11/23  Nustep L5 all four extremities x8 minutes Mod cues for good machine use Lateral trunk crunches (limited on the L due to shoulder pain) with light ball toss in between x10 B  STS no UEs up to tip toes to help counter posterior lean when first standing up x10, up to MinA as fatigue increased  Tried double march in sitting with transition to march in standing but this was a bit overwhelming for him Alternating toe taps on 4 inch step progressing to 8 inch step with intermittent cues to focus on task at hand  Forward steps to target to promote improved step length, Mod multimodal cues  Light L shoulder stretching for flexion to tolerance   07/26/23 Getting in and out of car with him due to rain, he struggled due to a lot going on and it seemed to fluster him some Nustep level 5 x 7 minutes UBE level 3 x 6 minutes Supine bridges Partial sit ups Standing ball toss and volleyball some cues for left hand Gait with HHA x 3 laps Practice with him and wife getting in and out of bed, x3  07/21/23 Gait outside with HHA on the right negotiating curbs, cues for step length and posture, around to the front door,  Nustep level 5 x 7 minutes 2.5# LAQ 2.5# [employer] with wife regarding renewal and what is going on with Darrow End, she reports that in the morning he freezes when trying to go to the bathroom, and cannot sit, she also reports that she is using Max A to get him from supine to sit.  We practiced this with Darrow [place] in the bathroom and today he had no big issues of freezing. I started some education with wife on  some physics of levers and fulcrums of trying to get him out of bed from supine Pball press for core work Retested [place] and 5XSTS  07/14/23 Nustep level 5 x 7 minutes UBE level 2 x 4 minutes Passive stretch hand, fingers, wrist, elbow and shoulder left Standing step turn reach touch Standing reaching for numbers on the wall with left hand and right Sit to stand with cues for both arms to push up. Gait outside with HHA 200 feet then rest and then 200 feet through some grass and uneven terrain to the car  PATIENT EDUCATION: Education details: none new 02/16/23 Person educated: Patient and Spouse Education method: Explanation, Demonstration, Tactile cues, and Verbal cues Education comprehension: verbalized understanding, returned demonstration, verbal cues required, tactile cues required, and needs further education   ------------------------------------------------------------------------------------------------------- (Measures in this section from initial evaluation unless otherwise noted) DIAGNOSTIC FINDINGS:  MRI 2021 with degenerative changes in lumbar spine, L3-4 R subarticular stenosis COGNITION: Overall cognitive status: History of cognitive impairments - at baseline SENSATION: Not tested COORDINATION: Moderately impaired BLE MUSCLE LENGTH: Hamstrings: severely restricted B Thomas test: Severely restricted B. POSTURE: rounded shoulders, forward head, increased thoracic kyphosis, posterior pelvic tilt, and flexed trunk  LOWER EXTREMITY ROM:   BLE extremely stiff throughout, limited hip ROM in all planes, B knees limited in extension. LOWER EXTREMITY MMT:  3+/5 throughout. Unable to determine accuracy of MMT due to cognition. Functionally demosntrates poor coordinated activation and poor muscular endurance. BED MOBILITY: Min A for Supine<> Sit. Patient was using a ladder in his hospital bed and could move MI. TRANSFERS: Min A, mod VC, tends to lean back.  11/02/22 independent with  use of hands on arm chair with a couple of tries CURB: Min A-Has one step out to his deck. STAIRS: N/A  GAIT: Gait pattern: step to pattern, decreased arm swing- Right, decreased arm swing- Left, decreased step length- Right, decreased step length- Left, shuffling, festinating, trunk flexed, narrow BOS, poor foot clearance- Right, and poor foot clearance- Left Distance walked: 63' Assistive device utilized: Environmental consultant - 2 wheeled Level of assistance: Occasional min A Comments: mod VC to stay close to walker, take longer steps. FUNCTIONAL TESTS:  5 times sit to stand: 38.23  09/29/22  could not performs 5XSTS, 11/02/22 = 47 seconds with cues to use arms, 07/21/23 really struggled  today 55 seconds Timed up and go (TUG): 47.91  with RW, 07/21/22 TUG 29  seconds without device, 09/29/22 25 seconds, 11/02/22 TUG 20 seconds, 07/21/23 = 26 seconds no device SBA                                                                     PATIENT EDUCATION: Education details: Progress towards goals, POC Person educated: Patient and Spouse Education method: Explanation, Demonstration, and Verbal cues  Education comprehension: verbalized understanding, returned demonstration, verbal cues required, and needs further education  HOME EXERCISE PROGRAM: PWR! moves  GOALS: Goals reviewed with patient? Yes  SHORT TERM GOALS: Target date: 12/22/22  Perform HEP for PWR! Moves with cues from family. Baseline: Pt's wife, Amalia Badder, notes walking and hitting beach ball as main HEP. Goal status: GOAL MET, 12/20/2022   2. Decrease 5 x STS to < 35 sec to demonstrate improved functional strength  Baseline: 47 seconds on 5/21 per note; 34.5 sec UE support 12/20/2022  Goal status: GOAL MET, 12/20/2022   3. Perform sit <> stand transfers from average chair height with Supervision.  Baseline: CGA  12/20/2022; (01/10/23) CGA  Goal status: NOT MET  LONG TERM GOALS: Target date: 04/22/23  Patient will perform HEP progression with caregiver  assist. Baseline: has minimal HEP Goal status: progressing  2.  Decrease 5 x STS to < 25 sec to demonstrate improved functional strength Baseline: 47 seconds on 5/21 per note; (01/10/23) 36 sec w/ cues for speed/sequence>30.38 sec with BUE supprot Goal status: PARTIALLY MET, 01/24/2023  3. Improve quality of gait to be able to ambulate x 100 feet with supervision. Baseline: CGA with cues  Goal status: Met 08/18/23  4.  Patient will perform his bed mobility with MI including rolling, sit<>supine, and scooting in bed. Baseline: on hard surface mat he is independent but on soft bed at home he struggles, wife reports only needs a hand to get up now 11/02/22; (01/10/23) modified indep.  Goal status: MET  5.  Patient will demonstrate floor<>stand with CGA and verbal cues for patient's wife to be able to assist. Baseline:  Has to call 9-1-1; +2 max assist floor>stand with UE support 01/19/2023 Goal status: GOAL NOT MET 01/19/2023  **UPDATED GOALS BELOW ** per recert 05/17/2023  SHORT TERM GOALS: Target date: 02/25/2023   Pt will perform HEP to maintain lower body strength, flexibility, posture with wife's assistance. Baseline:  Goal status: met 04/12/23  2. Pt will perform sit to stand, 8 of 10 reps with BUE support, with supervision, to maintain safety with transfers.  Baseline: min guard  Goal status: 02/07/23-CGA from elevated surface and max VC to lean forward, ongoing   LONG TERM GOALS: Target date: 07/18/23   Patient will perform HEP progression to maintain lower body strength, flexibility, posture with wife's assistance. Baseline:  Goal status: ongoing but progressing 08/25/23  2.  Pt will ambulate 150-175 ft in 2 MWT with min guard assist, to preserve functional mobility within the home.  Baseline: Goal status:  met 08/25/23  3. Wife will verbalize tips for fall prevention education to minimize fall frequency Baseline:  Goal status:progressing and doing well 4.  Patient will be able to  toilet without freezing for over a 4 week  period 5.  Patient will be able to get out of bed from supine with wife assist minimal Assist,met 10/06/23   ASSESSMENT:  CLINICAL IMPRESSION:  Patient and wife report fatigue in the legs, question if they do the pedal bike too much, talked to them about 5-7 minutes 1x/day to see if this helps, the gym was very busy today and he was a little more distracted, did well with following cues at times, did well walking with me but again needs CGA/HHA due to shuffling feet OBJECTIVE IMPAIRMENTS: Abnormal gait, decreased activity tolerance, decreased balance, decreased cognition, decreased coordination, decreased endurance, decreased mobility, difficulty walking, decreased ROM, decreased strength, decreased safety awareness, impaired flexibility, impaired UE functional use, improper body mechanics, and postural dysfunction.   PLAN:  PT FREQUENCY: 1x/week  PT DURATION: 12 weeks  PLANNED INTERVENTIONS: Therapeutic exercises, Therapeutic activity, Neuromuscular re-education, Balance training, Gait training, Patient/Family education, Self Care, Joint mobilization, Stair training, Moist heat, and Manual therapy  PLAN FOR NEXT SESSION:   work on core and reciprocal patterns Patient Details  Name: DARRELD HOFFER MRN: [id number] Date of Birth: 01/28/1941 Referring Provider:  Crecencio Dodge, Candida Chalk, *  Cherylene Corrente, PT

## 2023-10-11 ENCOUNTER — Encounter: Payer: Self-pay | Admitting: Physical Therapy

## 2023-10-11 ENCOUNTER — Ambulatory Visit: Admitting: Physical Therapy

## 2023-10-11 DIAGNOSIS — I69354 Hemiplegia and hemiparesis following cerebral infarction affecting left non-dominant side: Secondary | ICD-10-CM

## 2023-10-11 DIAGNOSIS — M25512 Pain in left shoulder: Secondary | ICD-10-CM

## 2023-10-11 DIAGNOSIS — R296 Repeated falls: Secondary | ICD-10-CM

## 2023-10-11 DIAGNOSIS — M6281 Muscle weakness (generalized): Secondary | ICD-10-CM

## 2023-10-11 DIAGNOSIS — R2689 Other abnormalities of gait and mobility: Secondary | ICD-10-CM

## 2023-10-11 DIAGNOSIS — R262 Difficulty in walking, not elsewhere classified: Secondary | ICD-10-CM

## 2023-10-11 NOTE — Therapy (Signed)
 OUTPATIENT PHYSICAL THERAPY NEURO TREATMENT  Patient Name: William [name] MRN: [id number] DOB:[birth year], 83 y.o., male Today's Date: 10/11/2023  PCP: Estill Hemming  DO REFERRING PROVIDER: same   END OF SESSION:  PT End of Session - 10/11/23 1452     Visit Number [id number]    Date for PT Re-Evaluation 11/27/23    Authorization Type HTA    PT Start Time 1445    PT Stop Time 1530    PT Time Calculation (min) 45 min    Equipment Utilized During Treatment Gait belt    Activity Tolerance Patient tolerated treatment well    Behavior During Therapy WFL for tasks assessed/performed                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   HTN (hypertension) 07/19/2015   Hyperlipidemia    Major neurocognitive disorder 01/09/2014   Mild, related to stroke history   Nocturia    Renal insufficiency 06/25/2013   S/P carotid endarterectomy    BILATERAL ICA--  PATENT PER DUPLEX  05-19-2012   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/26/2017   Right calf   Urinary frequency    Vitamin D  deficiency    Past Surgical History:  Procedure Laterality Date   APPENDECTOMY  AS CHILD   CARDIOVASCULAR STRESS TEST  03-27-2012  DR CRENSHAW   LOW RISK LEXISCAN  STUDY-- PROBABLE NORMAL PERFUSION AND SOFT TISSUE ATTENUATION/  NO ISCHEMIA/ EF 51%   CAROTID ENDARTERECTOMY Bilateral LEFT  11-12-2008  DR GREG HAYES   RIGHT ICA  2006  ([facility])   CHOLECYSTECTOMY N/A 02/23/2022   Procedure: LAPAROSCOPIC CHOLECYSTECTOMY;  Surgeon: Junie Olds, MD;  Location: [facility];  Service:  General;  Laterality: N/A;   CYSTOSCOPY W/ RETROGRADES Bilateral 06/22/2021   Procedure: CYSTOSCOPY WITH RETROGRADE PYELOGRAM;  Surgeon: Trent Frizzle, MD;  Location: [facility];  Service: Urology;  Laterality: Bilateral;   CYSTOSCOPY WITH LITHOLAPAXY N/A 02/26/2013   Procedure: CYSTOSCOPY WITH LITHOLAPAXY;  Surgeon: Trent Frizzle, MD;  Location: [facility];  Service: Urology;  Laterality: N/A;   ENDOSCOPIC RETROGRADE CHOLANGIOPANCREATOGRAPHY (ERCP) WITH PROPOFOL  N/A 02/22/2022   Procedure: ENDOSCOPIC RETROGRADE CHOLANGIOPANCREATOGRAPHY (ERCP) WITH PROPOFOL ;  Surgeon: Alvis Jourdain, MD;  Location: [facility] ENDOSCOPY;  Service: Gastroenterology;  Laterality: N/A;   EYE SURGERY  Jan. 2016   cataract surgery both eyes   INGUINAL HERNIA REPAIR Right 11-08-2006   IR KYPHO EA ADDL LEVEL THORACIC OR LUMBAR  02/12/2021   IR RADIOLOGIST EVAL & MGMT  02/18/2021   MASS EXCISION N/A 03/03/2016   Procedure: EXCISION OF BACK  MASS;  Surgeon: Lockie Rima, MD;  Location: [facility];  Service: General;  Laterality: N/A;   MOHS SURGERY Left 1/ 2016   Dr Del Favia-- Basal cell   PROSTATE SURGERY     REMOVAL OF STONES  02/22/2022   Procedure: REMOVAL OF STONES;  Surgeon: Alvis Jourdain, MD;  Location: [facility] ENDOSCOPY;  Service: Gastroenterology;;   [address]  02/22/2022  Procedure: SPHINCTEROTOMY;  Surgeon: Alvis Jourdain, MD;  Location: [facility];  Service: Gastroenterology;;   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 N/A 06/22/2021   Procedure: TRANSURETHRAL RESECTION OF BLADDER TUMOR;  Surgeon: Trent Frizzle, MD;  Location: [facility];  Service: Urology;  Laterality: N/A;   TRANSURETHRAL RESECTION OF PROSTATE N/A 02/26/2013   Procedure: TRANSURETHRAL RESECTION OF THE PROSTATE WITH GYRUS INSTRUMENTS;  Surgeon: Trent Frizzle, MD;  Location: [facility];  Service: Urology;  Laterality: N/A;   TRANSURETHRAL RESECTION OF  PROSTATE N/A 06/22/2021   Procedure: TRANSURETHRAL RESECTION OF THE PROSTATE (TURP);  Surgeon: Trent Frizzle, MD;  Location[facility];  Service: Urology;  Laterality: N/A;   Patient Active Problem List   Diagnosis Date Noted   Bronchitis 06/16/2023   Impacted cerumen of left ear 06/16/2023   Acute encephalopathy 04/18/2023   Parkinson's disease (HCC) 04/18/2023   History of CVA with residual deficit 04/18/2023   Chronic kidney disease, stage 3b (HCC) 04/18/2023   Dementia with behavioral disturbance (HCC) 04/18/2023   Anemia of chronic disease 04/18/2023   Laceration of right upper extremity 02/24/2023   Dermatitis 02/24/2023   Lung nodules 02/24/2023   Dysuria 08/03/2022   Nonintractable headache 07/01/2022   Bilateral impacted cerumen 06/24/2022   Rash 06/15/2022   Postoperative ileus (HCC) 02/27/2022   Ileus, postoperative (HCC) 02/26/2022   Choledocholithiasis 02/19/2022   DNR (do not resuscitate) 02/19/2022   Left elbow pain 01/26/2022   Mid back pain on left side 01/26/2022   Rib pain 01/26/2022   Acute pain of left shoulder 11/12/2021   Leukocytes in urine 11/12/2021   Urinary frequency 11/12/2021   Thrush 10/08/2021   Hemiplegia, 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   Insomnia    Prediabetes    Acute 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 (HCC)    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   Transaminitis 07/27/2021   UTI (urinary tract infection) 07/27/2021   CVA (cerebral vascular accident) (HCC) 07/27/2021   Fall 07/27/2021   Hyperglycemia 07/27/2021   Cholelithiasis 07/27/2021   Hypoxia 07/27/2021   Nausea and vomiting 07/27/2021   Acute metabolic encephalopathy 07/27/2021   Normocytic anemia 07/27/2021   Chronic back pain 07/27/2021   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   Closed fracture of first lumbar vertebra with routine healing 02/03/2021   Closed  fracture of multiple ribs 11/18/2020   Anxiety 01/29/2020   Leg pain, bilateral 01/29/2020   Ingrown toenail 07/13/2019   Lumbar spondylosis 05/02/2018   Pain in left knee 03/09/2018   Osteoarthritis of left hip 01/16/2018   Trochanteric bursitis of left hip 01/16/2018   Preventative health care 09/26/2017   HTN (hypertension) 07/19/2015   Hyperlipidemia 07/19/2015   Great toe pain 02/11/2014   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   Obesity (BMI 30-39.9) 06/25/2013   Renal insufficiency 06/25/2013   Weakness of left arm 06/25/2013   Sebaceous cyst 03/03/2011   Sprain of lumbar region 07/31/2010   Rib pain, left 08/29/2009   Carotid artery stenosis, asymptomatic, bilateral 05/02/2009   Eczema, atopic 05/31/2008   Vitamin D  deficiency 03/01/2008   BPH (benign prostatic hyperplasia) 08/06/2007   Fasting hyperglycemia 12/21/2006   History of right MCA infarct 2006    ONSET DATE: 06/29/22 REFERRING DIAG:  Diagnosis  I63.9 (ICD-10-CM) - Cerebrovascular accident (CVA), unspecified mechanism (HCC)  W19.XXXD (ICD-10-CM) - Fall, subsequent encounter    THERAPY DIAG:  Muscle weakness (  generalized)  Other abnormalities of gait and mobility  Hemiplegia and hemiparesis following cerebral infarction affecting left non-dominant side (HCC)  Difficulty in walking, not elsewhere classified  Repeated falls  Acute pain of left shoulder  Rationale for Evaluation and Treatment: Rehabilitation  SUBJECTIVE:                                                                                                                                                                                         SUBJECTIVE STATEMENT:  No falls, wife reports that his transfers have been pretty good lately  PERTINENT HISTORY:  MUSTAPHA SALADIN is a 83 year old man with dementia, CKD, HTN, CAD, HLD and history of CVA  (2006, 2023), Parkinson's  PAIN:  Are you having pain? Faces/behavior scale- mild  pain BLEs  and back   PRECAUTIONS: Fall  WEIGHT BEARING RESTRICTIONS: No  FALLS: Has patient fallen in last 6 months? No  LIVING ENVIRONMENT: Lives with: lives with their family and lives with their spouse Lives in: House/apartment Stairs:  1 brick high step Has following equipment at home: Environmental consultant - 2 wheeled, Wheelchair (manual), Shower bench, bed side commode, Grab bars, and hospital bed, sliding pad for car  PLOF: Independent with basic ADLs and Independent with household mobility without device  PATIENT GOALS: Walk around the block with his wife. Be able to use the dining room chair rather than have to use the W/C. Increased I with bed mobility and simple tasks at home like make some coffee or brush his teeth, Step over tub to shower, has bench, but he can't really use it. Up and down the one step to get to back deck. 11/23/22: Patient's wife Amalia Badder updated his goals today: Walk around store (gets distracted), swing a golf club again (thinking of driving range), get up on his own, walk without leaning forward, more recognition of the left side (from CVA), not need gait belt, OT-- be able to get dressed more on his own, navigate the newspaper.  OBJECTIVE:  TREATMENT: 10/11/23 Nustep level 5 x 7 minutes Seated volley ball In pbars on airex reaching for numbers Partial sit ups Elbow touches for trunk and core Gait around the back parking [place] with HHA, needed one long seated rest break, needed a lot of cues, on the second half of [address] he was tired and really dragging his feet with significant forward trunk lean.   Passive motions of the left shoulder  10/06/23 Bike level 4 x 5 minutes Nustep level 5 x 5 minutes 3# LAQ 3# marches Seated volley ball Passive stretch left shoulder Partial sit ups On airex reaching for numbers  on wall, trying to get him to use the left arm as well Gait outside with HHA half way around the parking [place] a short break and then walking in the  grass from the front of the building to the car  09/27/23 Nustep level 5 x 6 minutes Bike level 4 x 6 minutes Seated volleyball Standing volleyball Partial sit ups TUG 27 seconds but needed ModA due to a LOB, the second attempt he did in 19 seconds but uncontrolled sitting Supine bridges 2# flexion and chest press with wate bar Supine ball reaching Supine to sit with min A gait with him out to the car   09/13/23 Nustep level 5 x 7 mintues Bike level 4 x 6 minutes Gait outside with HHA/CGA 250' x 2 Supine bridges Isometric abs Left leg abduction Supine to sitting with min A Seated volley ball Seated ball kicks  09/06/23 Home assessment with wife: Bathroom set up is very cramped and crowded.  Has over commode toilet and grab bars, they have a walker in front of the commode, the way it is set up he has to walk side ways into the set up and side ways out, I lowered the front legs and put them in the shower that is directly in front and had the back legs out, this gave him about 10" more to get b/n the toilet and the walker so he could walk in and then turn instead of side ways. Bed transfer from supine:  We did this 4 times, dropped the side rail, raised head of the bed and wife typically uses a strap that she gives [place] and he pulls up with it.  Today he got up without it twice, we talked about protecting [name]/s back with leaning back and having a wide base of support, also to give [place] cues to lean forward as he tends to be retropulsive.   Shower:  Shelah Derry in and looked at this with Amalia Badder, gave some ideas as he is now holding grab bar and stepping over the tub to get in.  Asked about safer way of sitting and bringing legs over she said this was tried but they were unable to get his legs over. Went over HEP:  in his chair talked about reciprocal patterns and she looked on [place] for a pedal machine Gait: outside with HHA to the mail box and back cues for step length  08/25/23 Nustep level 5 x  6 minutes, cues for larger movements Bike level 4 x 6 minutes UBE level 3 x 5 mintues Worked on some bed mobility Bridges Partial sit ups Supine left leg sliding board hip abduction Seated volley ball with some cues to use the left hand Gait outside with hand hold assist and some cues for steps, some walking in grass  08/18/23 Nustep level 5 x 9 minutes some cues for larger motions Bike level 5 x 6 minutes UBE level 4 x 4 minutes Sitting on the mat table elbow touches a lot of cues to come to the left side Partial sit ups 3x10 Bridges 3x10 Hooklying marches Seated ball taps left hand and right hand Supine to sit x 2 with min A Gait with HHA 2 x 220 feet fast pace, cues for BIG steps  08/11/23  Nustep L5 all four extremities x8 minutes Mod cues for good machine use Lateral trunk crunches (limited on the L due to shoulder pain) with light ball toss in between x10 B  STS no UEs up to tip  toes to help counter posterior lean when first standing up x10, up to MinA as fatigue increased  Tried double march in sitting with transition to march in standing but this was a bit overwhelming for him Alternating toe taps on 4 inch step progressing to 8 inch step with intermittent cues to focus on task at hand  Forward steps to target to promote improved step length, Mod multimodal cues  Light L shoulder stretching for flexion to tolerance   07/26/23 Getting in and out of car with him due to rain, he struggled due to a lot going on and it seemed to fluster him some Nustep level 5 x 7 minutes UBE level 3 x 6 minutes Supine bridges Partial sit ups Standing ball toss and volleyball some cues for left hand Gait with HHA x 3 laps Practice with him and wife getting in and out of bed, x3  07/21/23 Gait outside with HHA on the right negotiating curbs, cues for step length and posture, around to the front door,  Nustep level 5 x 7 minutes 2.5# LAQ 2.5# [employer] with wife regarding renewal and  what is going on with [place], she reports that in the morning he freezes when trying to go to the bathroom, and cannot sit, she also reports that she is using Max A to get him from supine to sit.  We practiced this with [place] in the bathroom and today he had no big issues of freezing. I started some education with wife on some physics of levers and fulcrums of trying to get him out of bed from supine Pball press for core work Retested [place] and 5XSTS  07/14/23 Nustep level 5 x 7 minutes UBE level 2 x 4 minutes Passive stretch hand, fingers, wrist, elbow and shoulder left Standing step turn reach touch Standing reaching for numbers on the wall with left hand and right Sit to stand with cues for both arms to push up. Gait outside with HHA 200 feet then rest and then 200 feet through some grass and uneven terrain to the car  PATIENT EDUCATION: Education details: none new 02/16/23 Person educated: Patient and Spouse Education method: Explanation, Demonstration, Tactile cues, and Verbal cues Education comprehension: verbalized understanding, returned demonstration, verbal cues required, tactile cues required, and needs further education   ------------------------------------------------------------------------------------------------------- (Measures in this section from initial evaluation unless otherwise noted) DIAGNOSTIC FINDINGS:  MRI 2021 with degenerative changes in lumbar spine, L3-4 R subarticular stenosis COGNITION: Overall cognitive status: History of cognitive impairments - at baseline SENSATION: Not tested COORDINATION: Moderately impaired BLE MUSCLE LENGTH: Hamstrings: severely restricted B Thomas test: Severely restricted B. POSTURE: rounded shoulders, forward head, increased thoracic kyphosis, posterior pelvic tilt, and flexed trunk  LOWER EXTREMITY ROM:   BLE extremely stiff throughout, limited hip ROM in all planes, B knees limited in extension. LOWER EXTREMITY MMT:  3+/5  throughout. Unable to determine accuracy of MMT due to cognition. Functionally demosntrates poor coordinated activation and poor muscular endurance. BED MOBILITY: Min A for Supine<> Sit. Patient was using a ladder in his hospital bed and could move MI. TRANSFERS: Min A, mod VC, tends to lean back.  11/02/22 independent with use of hands on arm chair with a couple of tries CURB: Min A-Has one step out to his deck. STAIRS: N/A  GAIT: Gait pattern: step to pattern, decreased arm swing- Right, decreased arm swing- Left, decreased step length- Right, decreased step length- Left, shuffling, festinating, trunk flexed, narrow BOS, poor foot clearance- Right, and  poor foot clearance- Left Distance walked: 28' Assistive device utilized: Environmental consultant - 2 wheeled Level of assistance: Occasional min A Comments: mod VC to stay close to walker, take longer steps. FUNCTIONAL TESTS:  5 times sit to stand: 38.23  09/29/22  could not performs 5XSTS, 11/02/22 = 47 seconds with cues to use arms, 07/21/23 really struggled today 55 seconds Timed up and go (TUG): 47.91  with RW, 07/21/22 TUG 29  seconds without device, 09/29/22 25 seconds, 11/02/22 TUG 20 seconds, 07/21/23 = 26 seconds no device SBA                                                                     PATIENT EDUCATION: Education details: Progress towards goals, POC Person educated: Patient and Spouse Education method: Programmer, multimedia, Facilities manager, and Verbal cues  Education comprehension: verbalized understanding, returned demonstration, verbal cues required, and needs further education  HOME EXERCISE PROGRAM: PWR! moves  GOALS: Goals reviewed with patient? Yes  SHORT TERM GOALS: Target date: 12/22/22  Perform HEP for PWR! Moves with cues from family. Baseline: Pt's wife, Amalia Badder, notes walking and hitting beach ball as main HEP. Goal status: GOAL MET, 12/20/2022   2. Decrease 5 x STS to < 35 sec to demonstrate improved functional strength  Baseline: 47 seconds  on 5/21 per note; 34.5 sec UE support 12/20/2022  Goal status: GOAL MET, 12/20/2022   3. Perform sit <> stand transfers from average chair height with Supervision.  Baseline: CGA  12/20/2022; (01/10/23) CGA  Goal status: NOT MET  LONG TERM GOALS: Target date: 04/22/23  Patient will perform HEP progression with caregiver assist. Baseline: has minimal HEP Goal status: progressing  2.  Decrease 5 x STS to < 25 sec to demonstrate improved functional strength Baseline: 47 seconds on 5/21 per note; (01/10/23) 36 sec w/ cues for speed/sequence>30.38 sec with BUE supprot Goal status: PARTIALLY MET, 01/24/2023  3. Improve quality of gait to be able to ambulate x 100 feet with supervision. Baseline: CGA with cues  Goal status: Met 08/18/23  4.  Patient will perform his bed mobility with MI including rolling, sit<>supine, and scooting in bed. Baseline: on hard surface mat he is independent but on soft bed at home he struggles, wife reports only needs a hand to get up now 11/02/22; (01/10/23) modified indep.  Goal status: MET  5.  Patient will demonstrate floor<>stand with CGA and verbal cues for patient's wife to be able to assist. Baseline:  Has to call 9-1-1; +2 max assist floor>stand with UE support 01/19/2023 Goal status: GOAL NOT MET 01/19/2023  **UPDATED GOALS BELOW ** per recert 05/17/2023  SHORT TERM GOALS: Target date: 02/25/2023   Pt will perform HEP to maintain lower body strength, flexibility, posture with wife's assistance. Baseline:  Goal status: met 04/12/23  2. Pt will perform sit to stand, 8 of 10 reps with BUE support, with supervision, to maintain safety with transfers.  Baseline: min guard  Goal status: 02/07/23-CGA from elevated surface and max VC to lean forward, ongoing   LONG TERM GOALS: Target date: 07/18/23   Patient will perform HEP progression to maintain lower body strength, flexibility, posture with wife's assistance. Baseline:  Goal status: ongoing but progressing  08/25/23  2.  Pt will ambulate  150-175 ft in 2 MWT with min guard assist, to preserve functional mobility within the home.  Baseline: Goal status:  met 08/25/23  3. Wife will verbalize tips for fall prevention education to minimize fall frequency Baseline:  Goal status:progressing and doing well 4.  Patient will be able to toilet without freezing for over a 4 week period 5.  Patient will be able to get out of bed from supine with wife assist minimal Assist,met 10/06/23   ASSESSMENT:  CLINICAL IMPRESSION:  Patient and wife report that he has been doing better with his transfers recently, today with a longer walk he got very fatigued and needed almost mod A to stop the forward trunk lean, requiring multiple stops to reorient his posture with verbal and tactile cues, could not stop shuffling feet OBJECTIVE IMPAIRMENTS: Abnormal gait, decreased activity tolerance, decreased balance, decreased cognition, decreased coordination, decreased endurance, decreased mobility, difficulty walking, decreased ROM, decreased strength, decreased safety awareness, impaired flexibility, impaired UE functional use, improper body mechanics, and postural dysfunction.   PLAN:  PT FREQUENCY: 1x/week  PT DURATION: 12 weeks  PLANNED INTERVENTIONS: Therapeutic exercises, Therapeutic activity, Neuromuscular re-education, Balance training, Gait training, Patient/Family education, Self Care, Joint mobilization, Stair training, Moist heat, and Manual therapy  PLAN FOR NEXT SESSION:   work on core and reciprocal patterns, gait and transfers Patient Details  Name: William [name] MRN: [id number] Date of Birth: 24-Apr-1941 Referring Provider:  Crecencio Dodge, Candida Chalk, *  Cherylene Corrente, PT

## 2023-10-25 ENCOUNTER — Other Ambulatory Visit (HOSPITAL_BASED_OUTPATIENT_CLINIC_OR_DEPARTMENT_OTHER): Payer: Self-pay

## 2023-10-26 ENCOUNTER — Other Ambulatory Visit (HOSPITAL_BASED_OUTPATIENT_CLINIC_OR_DEPARTMENT_OTHER): Payer: Self-pay

## 2023-10-26 ENCOUNTER — Other Ambulatory Visit: Payer: Self-pay

## 2023-10-27 ENCOUNTER — Ambulatory Visit: Attending: Family Medicine | Admitting: Physical Therapy

## 2023-10-27 ENCOUNTER — Encounter: Payer: Self-pay | Admitting: Physical Therapy

## 2023-10-27 DIAGNOSIS — M6281 Muscle weakness (generalized): Secondary | ICD-10-CM | POA: Diagnosis not present

## 2023-10-27 DIAGNOSIS — R2689 Other abnormalities of gait and mobility: Secondary | ICD-10-CM | POA: Diagnosis not present

## 2023-10-27 DIAGNOSIS — R296 Repeated falls: Secondary | ICD-10-CM | POA: Diagnosis not present

## 2023-10-27 DIAGNOSIS — Z9181 History of falling: Secondary | ICD-10-CM | POA: Insufficient documentation

## 2023-10-27 DIAGNOSIS — R262 Difficulty in walking, not elsewhere classified: Secondary | ICD-10-CM | POA: Diagnosis not present

## 2023-10-27 DIAGNOSIS — M25512 Pain in left shoulder: Secondary | ICD-10-CM | POA: Diagnosis not present

## 2023-10-27 DIAGNOSIS — I69354 Hemiplegia and hemiparesis following cerebral infarction affecting left non-dominant side: Secondary | ICD-10-CM | POA: Diagnosis not present

## 2023-10-27 NOTE — Therapy (Signed)
 OUTPATIENT PHYSICAL THERAPY NEURO TREATMENT  Patient Name: William [name] MRN: [id number] DOB:01-13-1941, 83 y.o., male Today's Date: 10/27/2023  PCP: Estill Hemming  DO REFERRING PROVIDER: same   END OF SESSION:  PT End of Session - 10/27/23 1445     Visit Number [id number]    Date for PT Re-Evaluation 11/27/23    Authorization Type HTA    PT Start Time 1442    PT Stop Time 1528    PT Time Calculation (min) 46 min    Equipment Utilized During Treatment Gait belt    Activity Tolerance Patient tolerated treatment well    Behavior During Therapy WFL for tasks assessed/performed                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   HTN (hypertension) 07/19/2015   Hyperlipidemia    Major neurocognitive disorder 01/09/2014   Mild, related to stroke history   Nocturia    Renal insufficiency 06/25/2013   S/P carotid endarterectomy    BILATERAL ICA--  PATENT PER DUPLEX  05-19-2012   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/26/2017   Right calf   Urinary frequency    Vitamin D  deficiency    Past Surgical History:  Procedure Laterality Date   APPENDECTOMY  AS CHILD   CARDIOVASCULAR STRESS TEST  03-27-2012  DR CRENSHAW   LOW RISK LEXISCAN  STUDY-- PROBABLE NORMAL PERFUSION AND SOFT TISSUE ATTENUATION/  NO ISCHEMIA/ EF 51%   CAROTID ENDARTERECTOMY Bilateral LEFT  11-12-2008  DR GREG HAYES   RIGHT ICA  2006  ([facility])   CHOLECYSTECTOMY N/A 02/23/2022   Procedure: LAPAROSCOPIC CHOLECYSTECTOMY;  Surgeon: Junie Olds, MD;  Location: [facility];  Service:  General;  Laterality: N/A;   CYSTOSCOPY W/ RETROGRADES Bilateral 06/22/2021   Procedure: CYSTOSCOPY WITH RETROGRADE PYELOGRAM;  Surgeon: Trent Frizzle, MD;  Location: [facility];  Service: Urology;  Laterality: Bilateral;   CYSTOSCOPY WITH LITHOLAPAXY N/A 02/26/2013   Procedure: CYSTOSCOPY WITH LITHOLAPAXY;  Surgeon: Trent Frizzle, MD;  Location: [facility];  Service: Urology;  Laterality: N/A;   ENDOSCOPIC RETROGRADE CHOLANGIOPANCREATOGRAPHY (ERCP) WITH PROPOFOL  N/A 02/22/2022   Procedure: ENDOSCOPIC RETROGRADE CHOLANGIOPANCREATOGRAPHY (ERCP) WITH PROPOFOL ;  Surgeon: Alvis Jourdain, MD;  Location: [facility];  Service: Gastroenterology;  Laterality: N/A;   EYE SURGERY  Jan. 2016   cataract surgery both eyes   INGUINAL HERNIA REPAIR Right 11-08-2006   IR KYPHO EA ADDL LEVEL THORACIC OR LUMBAR  02/12/2021   IR RADIOLOGIST EVAL & MGMT  02/18/2021   MASS EXCISION N/A 03/03/2016   Procedure: EXCISION OF BACK  MASS;  Surgeon: Lockie Rima, MD;  Location: [facility];  Service: General;  Laterality: N/A;   MOHS SURGERY Left 1/ 2016   Dr Del Favia-- Basal cell   PROSTATE SURGERY     REMOVAL OF STONES  02/22/2022   Procedure: REMOVAL OF STONES;  Surgeon: Alvis Jourdain, MD;  Location: [facility];  Service: Gastroenterology;;   [address]  02/22/2022  Procedure: SPHINCTEROTOMY;  Surgeon: Alvis Jourdain, MD;  Location: [facility];  Service: Gastroenterology;;   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 N/A 06/22/2021   Procedure: TRANSURETHRAL RESECTION OF BLADDER TUMOR;  Surgeon: Trent Frizzle, MD;  Location: [facility];  Service: Urology;  Laterality: N/A;   TRANSURETHRAL RESECTION OF PROSTATE N/A 02/26/2013   Procedure: TRANSURETHRAL RESECTION OF THE PROSTATE WITH GYRUS INSTRUMENTS;  Surgeon: Trent Frizzle, MD;  Location: [facility];  Service: Urology;  Laterality: N/A;   TRANSURETHRAL RESECTION OF  PROSTATE N/A 06/22/2021   Procedure: TRANSURETHRAL RESECTION OF THE PROSTATE (TURP);  Surgeon: Trent Frizzle, MD;  Location: [facility];  Service: Urology;  Laterality: N/A;   Patient Active Problem List   Diagnosis Date Noted   Bronchitis 06/16/2023   Impacted cerumen of left ear 06/16/2023   Acute encephalopathy 04/18/2023   Parkinson's disease (HCC) 04/18/2023   History of CVA with residual deficit 04/18/2023   Chronic kidney disease, stage 3b (HCC) 04/18/2023   Dementia with behavioral disturbance (HCC) 04/18/2023   Anemia of chronic disease 04/18/2023   Laceration of right upper extremity 02/24/2023   Dermatitis 02/24/2023   Lung nodules 02/24/2023   Dysuria 08/03/2022   Nonintractable headache 07/01/2022   Bilateral impacted cerumen 06/24/2022   Rash 06/15/2022   Postoperative ileus (HCC) 02/27/2022   Ileus, postoperative (HCC) 02/26/2022   Choledocholithiasis 02/19/2022   DNR (do not resuscitate) 02/19/2022   Left elbow pain 01/26/2022   Mid back pain on left side 01/26/2022   Rib pain 01/26/2022   Acute pain of left shoulder 11/12/2021   Leukocytes in urine 11/12/2021   Urinary frequency 11/12/2021   Thrush 10/08/2021   Hemiplegia, 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   Insomnia    Prediabetes    Acute 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 (HCC)    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   Transaminitis 07/27/2021   UTI (urinary tract infection) 07/27/2021   CVA (cerebral vascular accident) (HCC) 07/27/2021   Fall 07/27/2021   Hyperglycemia 07/27/2021   Cholelithiasis 07/27/2021   Hypoxia 07/27/2021   Nausea and vomiting 07/27/2021   Acute metabolic encephalopathy 07/27/2021   Normocytic anemia 07/27/2021   Chronic back pain 07/27/2021   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   Closed fracture of first lumbar vertebra with routine healing 02/03/2021   Closed  fracture of multiple ribs 11/18/2020   Anxiety 01/29/2020   Leg pain, bilateral 01/29/2020   Ingrown toenail 07/13/2019   Lumbar spondylosis 05/02/2018   Pain in left knee 03/09/2018   Osteoarthritis of left hip 01/16/2018   Trochanteric bursitis of left hip 01/16/2018   Preventative health care 09/26/2017   HTN (hypertension) 07/19/2015   Hyperlipidemia 07/19/2015   Great toe pain 02/11/2014   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   Obesity (BMI 30-39.9) 06/25/2013   Renal insufficiency 06/25/2013   Weakness of left arm 06/25/2013   Sebaceous cyst 03/03/2011   Sprain of lumbar region 07/31/2010   Rib pain, left 08/29/2009   Carotid artery stenosis, asymptomatic, bilateral 05/02/2009   Eczema, atopic 05/31/2008   Vitamin D  deficiency 03/01/2008   BPH (benign prostatic hyperplasia) 08/06/2007   Fasting hyperglycemia 12/21/2006   History of right MCA infarct 2006    ONSET DATE: 06/29/22 REFERRING DIAG:  Diagnosis  I63.9 (ICD-10-CM) - Cerebrovascular accident (CVA), unspecified mechanism (HCC)  W19.XXXD (ICD-10-CM) - Fall, subsequent encounter    THERAPY DIAG:  Muscle weakness (  generalized)  Other abnormalities of gait and mobility  Hemiplegia and hemiparesis following cerebral infarction affecting left non-dominant side (HCC)  Difficulty in walking, not elsewhere classified  Repeated falls  Acute pain of left shoulder  History of falling  Rationale for Evaluation and Treatment: Rehabilitation  SUBJECTIVE:                                                                                                                                                                                         SUBJECTIVE STATEMENT:  No falls, wife reports that his transfers have been pretty good lately, changed to giving medication in the morning and this is helping  PERTINENT HISTORY:  KRUZE VOLZ is a 83 year old man with dementia, CKD, HTN, CAD, HLD and history of CVA   (2006, 2023), Parkinson's  PAIN:  Are you having pain? Faces/behavior scale- mild pain BLEs  and back   PRECAUTIONS: Fall  WEIGHT BEARING RESTRICTIONS: No  FALLS: Has patient fallen in last 6 months? No  LIVING ENVIRONMENT: Lives with: lives with their family and lives with their spouse Lives in: House/apartment Stairs: 1 brick high step Has following equipment at home: Environmental consultant - 2 wheeled, Wheelchair (manual), Shower bench, bed side commode, Grab bars, and hospital bed, sliding pad for car  PLOF: Independent with basic ADLs and Independent with household mobility without device  PATIENT GOALS: Walk around the block with his wife. Be able to use the dining room chair rather than have to use the W/C. Increased I with bed mobility and simple tasks at home like make some coffee or brush his teeth, Step over tub to shower, has bench, but he can't really use it. Up and down the one step to get to back deck. 11/23/22: Patient's wife Amalia Badder updated his goals today: Walk around store (gets distracted), swing a golf club again (thinking of driving range), get up on his own, walk without leaning forward, more recognition of the left side (from CVA), not need gait belt, OT-- be able to get dressed more on his own, navigate the newspaper.  OBJECTIVE:  TREATMENT: 10/27/23 Nustep level 5 x 6 minutes Bike level 5 x 5 minutes UBE level 4 x 5 minutes Seated partial sit ups Standing right arm 10# row/extension and then 5# left arm Standing with SPC 6" toe taps alternating Seated volley ball Seated 5# LAQ Seated elbow touches for core motions Passive left shoulder stretches   10/11/23 Nustep level 5 x 7 minutes Seated volley ball In pbars on airex reaching for numbers Partial sit ups Elbow touches for trunk and core Gait around the back parking [place] with HHA,  needed one long seated rest break, needed a lot of cues, on the second half of the [place] he was tired and really dragging his feet with  significant forward trunk lean.   Passive motions of the left shoulder  10/06/23 Bike level 4 x 5 minutes Nustep level 5 x 5 minutes 3# LAQ 3# marches Seated volley ball Passive stretch left shoulder Partial sit ups On airex reaching for numbers on wall, trying to get him to use the left arm as well Gait outside with HHA half way around the parking [place] a short break and then walking in the grass from the front of the building to the car  09/27/23 Nustep level 5 x 6 minutes Bike level 4 x 6 minutes Seated volleyball Standing volleyball Partial sit ups TUG 27 seconds but needed ModA due to a LOB, the second attempt he did in 19 seconds but uncontrolled sitting Supine bridges 2# flexion and chest press with wate bar Supine ball reaching Supine to sit with min A gait with him out to the car   09/13/23 Nustep level 5 x 7 mintues Bike level 4 x 6 minutes Gait outside with HHA/CGA 250' x 2 Supine bridges Isometric abs Left leg abduction Supine to sitting with min A Seated volley ball Seated ball kicks  09/06/23 Home assessment with wife: Bathroom set up is very cramped and crowded.  Has over commode toilet and grab bars, they have a walker in front of the commode, the way it is set up he has to walk side ways into the set up and side ways out, I lowered the front legs and put them in the shower that is directly in front and had the back legs out, this gave him about 10" more to get b/n the toilet and the walker so he could walk in and then turn instead of side ways. Bed transfer from supine:  We did this 4 times, dropped the side rail, raised head of the bed and wife typically uses a strap that she gives [place] and he pulls up with it.  Today he got up without it twice, we talked about protecting [name]/s back with leaning back and having a wide base of support, also to give [place] cues to lean forward as he tends to be retropulsive.   Shower:  Shelah Derry in and looked at this with Amalia Badder, gave  some ideas as he is now holding grab bar and stepping over the tub to get in.  Asked about safer way of sitting and bringing legs over she said this was tried but they were unable to get his legs over. Went over HEP:  in his chair talked about reciprocal patterns and she looked on [place] for a pedal machine Gait: outside with HHA to the mail box and back cues for step length  08/25/23 Nustep level 5 x 6 minutes, cues for larger movements Bike level 4 x 6 minutes UBE level 3 x 5 mintues Worked on some bed mobility Bridges Partial sit ups Supine left leg sliding board hip abduction Seated volley ball with some cues to use the left hand Gait outside with hand hold assist and some cues for steps, some walking in grass  08/18/23 Nustep level 5 x 9 minutes some cues for larger motions Bike level 5 x 6 minutes UBE level 4 x 4 minutes Sitting on the mat table elbow touches a lot of cues to come to the left side Partial sit ups 3x10 Bridges 3x10 Hooklying  marches Seated ball taps left hand and right hand Supine to sit x 2 with min A Gait with HHA 2 x 220 feet fast pace, cues for BIG steps  08/11/23  Nustep L5 all four extremities x8 minutes Mod cues for good machine use Lateral trunk crunches (limited on the L due to shoulder pain) with light ball toss in between x10 B  STS no UEs up to tip toes to help counter posterior lean when first standing up x10, up to MinA as fatigue increased  Tried double march in sitting with transition to march in standing but this was a bit overwhelming for him Alternating toe taps on 4 inch step progressing to 8 inch step with intermittent cues to focus on task at hand  Forward steps to target to promote improved step length, Mod multimodal cues  Light L shoulder stretching for flexion to tolerance   07/26/23 Getting in and out of car with him due to rain, he struggled due to a lot going on and it seemed to fluster him some Nustep level 5 x 7 minutes UBE  level 3 x 6 minutes Supine bridges Partial sit ups Standing ball toss and volleyball some cues for left hand Gait with HHA x 3 laps Practice with him and wife getting in and out of bed, x3  07/21/23 Gait outside with HHA on the right negotiating curbs, cues for step length and posture, around to the front door,  Nustep level 5 x 7 minutes 2.5# LAQ 2.5# [employer] with wife regarding renewal and what is going on with D[place], she reports that in the morning he freezes when trying to go to the bathroom, and cannot sit, she also reports that she is using Max A to get him from supine to sit.  We practiced this with [place] in the bathroom and today he had no big issues of freezing. I started some education with wife on some physics of levers and fulcrums of trying to get him out of bed from supine Pball press for core work Retested [place] and 5XSTS  07/14/23 Nustep level 5 x 7 minutes UBE level 2 x 4 minutes Passive stretch hand, fingers, wrist, elbow and shoulder left Standing step turn reach touch Standing reaching for numbers on the wall with left hand and right Sit to stand with cues for both arms to push up. Gait outside with HHA 200 feet then rest and then 200 feet through some grass and uneven terrain to the car  PATIENT EDUCATION: Education details: none new 02/16/23 Person educated: Patient and Spouse Education method: Explanation, Demonstration, Tactile cues, and Verbal cues Education comprehension: verbalized understanding, returned demonstration, verbal cues required, tactile cues required, and needs further education   ------------------------------------------------------------------------------------------------------- (Measures in this section from initial evaluation unless otherwise noted) DIAGNOSTIC FINDINGS:  MRI 2021 with degenerative changes in lumbar spine, L3-4 R subarticular stenosis COGNITION: Overall cognitive status: History of cognitive impairments - at  baseline SENSATION: Not tested COORDINATION: Moderately impaired BLE MUSCLE LENGTH: Hamstrings: severely restricted B Thomas test: Severely restricted B. POSTURE: rounded shoulders, forward head, increased thoracic kyphosis, posterior pelvic tilt, and flexed trunk  LOWER EXTREMITY ROM:   BLE extremely stiff throughout, limited hip ROM in all planes, B knees limited in extension. LOWER EXTREMITY MMT:  3+/5 throughout. Unable to determine accuracy of MMT due to cognition. Functionally demosntrates poor coordinated activation and poor muscular endurance. BED MOBILITY: Min A for Supine<> Sit. Patient was using a ladder in his hospital bed and  could move MI. TRANSFERS: Min A, mod VC, tends to lean back.  11/02/22 independent with use of hands on arm chair with a couple of tries CURB: Min A-Has one step out to his deck. STAIRS: N/A  GAIT: Gait pattern: step to pattern, decreased arm swing- Right, decreased arm swing- Left, decreased step length- Right, decreased step length- Left, shuffling, festinating, trunk flexed, narrow BOS, poor foot clearance- Right, and poor foot clearance- Left Distance walked: 28' Assistive device utilized: Environmental consultant - 2 wheeled Level of assistance: Occasional min A Comments: mod VC to stay close to walker, take longer steps. FUNCTIONAL TESTS:  5 times sit to stand: 38.23  09/29/22  could not performs 5XSTS, 11/02/22 = 47 seconds with cues to use arms, 07/21/23 really struggled today 55 seconds Timed up and go (TUG): 47.91 with RW, 07/21/22 TUG 29  seconds without device, 09/29/22 25 seconds, 11/02/22 TUG 20 seconds, 07/21/23 = 26 seconds no device SBA                                                                     PATIENT EDUCATION: Education details: Progress towards goals, POC Person educated: Patient and Spouse Education method: Programmer, multimedia, Facilities manager, and Verbal cues  Education comprehension: verbalized understanding, returned demonstration, verbal cues required, and  needs further education  HOME EXERCISE PROGRAM: PWR! moves  GOALS: Goals reviewed with patient? Yes  SHORT TERM GOALS: Target date: 12/22/22  Perform HEP for PWR! Moves with cues from family. Baseline: Pt's wife, Amalia Badder, notes walking and hitting beach ball as main HEP. Goal status: GOAL MET, 12/20/2022   2. Decrease 5 x STS to < 35 sec to demonstrate improved functional strength  Baseline: 47 seconds on 5/21 per note; 34.5 sec UE support 12/20/2022  Goal status: GOAL MET, 12/20/2022   3. Perform sit <> stand transfers from average chair height with Supervision.  Baseline: CGA  12/20/2022; (01/10/23) CGA  Goal status: NOT MET  LONG TERM GOALS: Target date: 04/22/23  Patient will perform HEP progression with caregiver assist. Baseline: has minimal HEP Goal status: progressing  2.  Decrease 5 x STS to < 25 sec to demonstrate improved functional strength Baseline: 47 seconds on 5/21 per note; (01/10/23) 36 sec w/ cues for speed/sequence>30.38 sec with BUE supprot Goal status: PARTIALLY MET, 01/24/2023  3. Improve quality of gait to be able to ambulate x 100 feet with supervision. Baseline: CGA with cues  Goal status: Met 08/18/23  4.  Patient will perform his bed mobility with MI including rolling, sit<>supine, and scooting in bed. Baseline: on hard surface mat he is independent but on soft bed at home he struggles, wife reports only needs a hand to get up now 11/02/22; (01/10/23) modified indep.  Goal status: MET  5.  Patient will demonstrate floor<>stand with CGA and verbal cues for patient's wife to be able to assist. Baseline:  Has to call 9-1-1; +2 max assist floor>stand with UE support 01/19/2023 Goal status: GOAL NOT MET 01/19/2023  **UPDATED GOALS BELOW ** per recert 05/17/2023  SHORT TERM GOALS: Target date: 02/25/2023   Pt will perform HEP to maintain lower body strength, flexibility, posture with wife's assistance. Baseline:  Goal status: met 04/12/23  2. Pt will perform sit to  stand,  8 of 10 reps with BUE support, with supervision, to maintain safety with transfers.  Baseline: min guard  Goal status: 02/07/23-CGA from elevated surface and max VC to lean forward, ongoing   LONG TERM GOALS: Target date: 07/18/23   Patient will perform HEP progression to maintain lower body strength, flexibility, posture with wife's assistance. Baseline:  Goal status: ongoing but progressing 08/25/23  2.  Pt will ambulate 150-175 ft in 2 MWT with min guard assist, to preserve functional mobility within the home.  Baseline: Goal status:  met 08/25/23  3. Wife will verbalize tips for fall prevention education to minimize fall frequency Baseline:  Goal status:progressing and doing well 4.  Patient will be able to toilet without freezing for over a 4 week period 5.  Patient will be able to get out of bed from supine with wife assist minimal Assist,met 10/06/23   ASSESSMENT:  CLINICAL IMPRESSION:  Continue to report less issues over all with bed transfers and with toilet, they did change medication times as well he really did well with me today, less distracted and able to be in the gym with other things going on.  He did great with the alternating toe taps, needing SBA only no CGA OBJECTIVE IMPAIRMENTS: Abnormal gait, decreased activity tolerance, decreased balance, decreased cognition, decreased coordination, decreased endurance, decreased mobility, difficulty walking, decreased ROM, decreased strength, decreased safety awareness, impaired flexibility, impaired UE functional use, improper body mechanics, and postural dysfunction.   PLAN:  PT FREQUENCY: 1x/week  PT DURATION: 12 weeks  PLANNED INTERVENTIONS: Therapeutic exercises, Therapeutic activity, Neuromuscular re-education, Balance training, Gait training, Patient/Family education, Self Care, Joint mobilization, Stair training, Moist heat, and Manual therapy  PLAN FOR NEXT SESSION:   work on core and reciprocal patterns, gait  and transfers Patient Details  Name: William [name] MRN: [id number] Date of Birth: 1941/03/10 Referring Provider:  Crecencio Dodge, Candida Chalk, *  Cherylene Corrente, PT

## 2023-11-01 ENCOUNTER — Encounter: Payer: Self-pay | Admitting: Physical Therapy

## 2023-11-01 ENCOUNTER — Ambulatory Visit: Admitting: Physical Therapy

## 2023-11-01 DIAGNOSIS — M6281 Muscle weakness (generalized): Secondary | ICD-10-CM

## 2023-11-01 DIAGNOSIS — R2689 Other abnormalities of gait and mobility: Secondary | ICD-10-CM

## 2023-11-01 DIAGNOSIS — R296 Repeated falls: Secondary | ICD-10-CM

## 2023-11-01 DIAGNOSIS — I69354 Hemiplegia and hemiparesis following cerebral infarction affecting left non-dominant side: Secondary | ICD-10-CM

## 2023-11-01 DIAGNOSIS — R262 Difficulty in walking, not elsewhere classified: Secondary | ICD-10-CM

## 2023-11-01 DIAGNOSIS — M25512 Pain in left shoulder: Secondary | ICD-10-CM

## 2023-11-01 NOTE — Therapy (Signed)
 OUTPATIENT PHYSICAL THERAPY NEURO TREATMENT  Patient Name: William [name] MRN: [id number] DOB:02-09-1941, 83 y.o., male Today's Date: 11/01/2023  PCP: Estill Hemming  DO REFERRING PROVIDER: same   END OF SESSION:  PT End of Session - 11/01/23 1530     Visit Number [id number]    Date for PT Re-Evaluation 11/27/23    Authorization Type HTA    PT Start Time 1525    PT Stop Time 1610    PT Time Calculation (min) 45 min    Equipment Utilized During Treatment Gait belt    Activity Tolerance Patient tolerated treatment well    Behavior During Therapy WFL for tasks assessed/performed                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   HTN (hypertension) 07/19/2015   Hyperlipidemia    Major neurocognitive disorder 01/09/2014   Mild, related to stroke history   Nocturia    Renal insufficiency 06/25/2013   S/P carotid endarterectomy    BILATERAL ICA--  PATENT PER DUPLEX  05-19-2012   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 09/26/2017   Right calf   Urinary frequency    Vitamin D  deficiency    Past Surgical History:  Procedure Laterality Date   APPENDECTOMY  AS CHILD   CARDIOVASCULAR STRESS TEST  03-27-2012  DR CRENSHAW   LOW RISK LEXISCAN  STUDY-- PROBABLE NORMAL PERFUSION AND SOFT TISSUE ATTENUATION/  NO ISCHEMIA/ EF 51%   CAROTID ENDARTERECTOMY Bilateral LEFT  11-12-2008  DR GREG HAYES   RIGHT ICA  2006  ([facility])   CHOLECYSTECTOMY N/A 02/23/2022   Procedure: LAPAROSCOPIC CHOLECYSTECTOMY;  Surgeon: Junie Olds, MD;  Location: [facility] OR;  Service:  General;  Laterality: N/A;   CYSTOSCOPY W/ RETROGRADES Bilateral 06/22/2021   Procedure: CYSTOSCOPY WITH RETROGRADE PYELOGRAM;  Surgeon: Trent Frizzle, MD;  Location: [facility] [place] [facility];  Service: Urology;  Laterality: Bilateral;   CYSTOSCOPY WITH LITHOLAPAXY N/A 02/26/2013   Procedure: CYSTOSCOPY WITH LITHOLAPAXY;  Surgeon: Trent Frizzle, MD;  Location: [facility];  Service: Urology;  Laterality: N/A;   ENDOSCOPIC RETROGRADE CHOLANGIOPANCREATOGRAPHY (ERCP) WITH PROPOFOL  N/A 02/22/2022   Procedure: ENDOSCOPIC RETROGRADE CHOLANGIOPANCREATOGRAPHY (ERCP) WITH PROPOFOL ;  Surgeon: Alvis Jourdain, MD;  Location: [facility] ENDOSCOPY;  Service: Gastroenterology;  Laterality: N/A;   EYE SURGERY  Jan. 2016   cataract surgery both eyes   INGUINAL HERNIA REPAIR Right 11-08-2006   IR KYPHO EA ADDL LEVEL THORACIC OR LUMBAR  02/12/2021   IR RADIOLOGIST EVAL & MGMT  02/18/2021   MASS EXCISION N/A 03/03/2016   Procedure: EXCISION OF BACK  MASS;  Surgeon: Lockie Rima, MD;  Location: [facility];  Service: General;  Laterality: N/A;   MOHS SURGERY Left 1/ 2016   Dr Del Favia-- Basal cell   PROSTATE SURGERY     REMOVAL OF STONES  02/22/2022   Procedure: REMOVAL OF STONES;  Surgeon: Alvis Jourdain, MD;  Location: [facility] ENDOSCOPY;  Service: Gastroenterology;;   [address]  02/22/2022  Procedure: SPHINCTEROTOMY;  Surgeon: Alvis Jourdain, MD;  Location: [facility] ENDOSCOPY;  Service: Gastroenterology;;   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 N/A 06/22/2021   Procedure: TRANSURETHRAL RESECTION OF BLADDER TUMOR;  Surgeon: Trent Frizzle, MD;  Location: [facility];  Service: Urology;  Laterality: N/A;   TRANSURETHRAL RESECTION OF PROSTATE N/A 02/26/2013   Procedure: TRANSURETHRAL RESECTION OF THE PROSTATE WITH GYRUS INSTRUMENTS;  Surgeon: Trent Frizzle, MD;  Location: [facility];  Service: Urology;  Laterality: N/A;   TRANSURETHRAL RESECTION OF  PROSTATE N/A 06/22/2021   Procedure: TRANSURETHRAL RESECTION OF THE PROSTATE (TURP);  Surgeon: Trent Frizzle, MD;  Location: [facility];  Service: Urology;  Laterality: N/A;   Patient Active Problem List   Diagnosis Date Noted   Bronchitis 06/16/2023   Impacted cerumen of left ear 06/16/2023   Acute encephalopathy 04/18/2023   Parkinson's disease (HCC) 04/18/2023   History of CVA with residual deficit 04/18/2023   Chronic kidney disease, stage 3b (HCC) 04/18/2023   Dementia with behavioral disturbance (HCC) 04/18/2023   Anemia of chronic disease 04/18/2023   Laceration of right upper extremity 02/24/2023   Dermatitis 02/24/2023   Lung nodules 02/24/2023   Dysuria 08/03/2022   Nonintractable headache 07/01/2022   Bilateral impacted cerumen 06/24/2022   Rash 06/15/2022   Postoperative ileus (HCC) 02/27/2022   Ileus, postoperative (HCC) 02/26/2022   Choledocholithiasis 02/19/2022   DNR (do not resuscitate) 02/19/2022   Left elbow pain 01/26/2022   Mid back pain on left side 01/26/2022   Rib pain 01/26/2022   Acute pain of left shoulder 11/12/2021   Leukocytes in urine 11/12/2021   Urinary frequency 11/12/2021   Thrush 10/08/2021   Hemiplegia, 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   Insomnia    Prediabetes    Acute 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 (HCC)    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   Transaminitis 07/27/2021   UTI (urinary tract infection) 07/27/2021   CVA (cerebral vascular accident) (HCC) 07/27/2021   Fall 07/27/2021   Hyperglycemia 07/27/2021   Cholelithiasis 07/27/2021   Hypoxia 07/27/2021   Nausea and vomiting 07/27/2021   Acute metabolic encephalopathy 07/27/2021   Normocytic anemia 07/27/2021   Chronic back pain 07/27/2021   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   Closed fracture of first lumbar vertebra with routine healing 02/03/2021   Closed  fracture of multiple ribs 11/18/2020   Anxiety 01/29/2020   Leg pain, bilateral 01/29/2020   Ingrown toenail 07/13/2019   Lumbar spondylosis 05/02/2018   Pain in left knee 03/09/2018   Osteoarthritis of left hip 01/16/2018   Trochanteric bursitis of left hip 01/16/2018   Preventative health care 09/26/2017   HTN (hypertension) 07/19/2015   Hyperlipidemia 07/19/2015   Great toe pain 02/11/2014   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   Obesity (BMI 30-39.9) 06/25/2013   Renal insufficiency 06/25/2013   Weakness of left arm 06/25/2013   Sebaceous cyst 03/03/2011   Sprain of lumbar region 07/31/2010   Rib pain, left 08/29/2009   Carotid artery stenosis, asymptomatic, bilateral 05/02/2009   Eczema, atopic 05/31/2008   Vitamin D  deficiency 03/01/2008   BPH (benign prostatic hyperplasia) 08/06/2007   Fasting hyperglycemia 12/21/2006   History of right MCA infarct 2006    ONSET DATE: 06/29/22 REFERRING DIAG:  Diagnosis  I63.9 (ICD-10-CM) - Cerebrovascular accident (CVA), unspecified mechanism (HCC)  W19.XXXD (ICD-10-CM) - Fall, subsequent encounter    THERAPY DIAG:  Muscle weakness (  generalized)  Other abnormalities of gait and mobility  Hemiplegia and hemiparesis following cerebral infarction affecting left non-dominant side (HCC)  Repeated falls  Difficulty in walking, not elsewhere classified  Acute pain of left shoulder  Rationale for Evaluation and Treatment: Rehabilitation  SUBJECTIVE:                                                                                                                                                                                         SUBJECTIVE STATEMENT:  Doing okay, no falls  PERTINENT HISTORY:  COLBURN ASPER is a 83 year old man with dementia, CKD, HTN, CAD, HLD and history of CVA  (2006, 2023), Parkinson's  PAIN:  Are you having pain? Faces/behavior scale- mild pain BLEs  and back   PRECAUTIONS: Fall  WEIGHT  BEARING RESTRICTIONS: No  FALLS: Has patient fallen in last 6 months? No  LIVING ENVIRONMENT: Lives with: lives with their family and lives with their spouse Lives in: House/apartment Stairs: 1 brick high step Has following equipment at home: Environmental consultant - 2 wheeled, Wheelchair (manual), Shower bench, bed side commode, Grab bars, and hospital bed, sliding pad for car  PLOF: Independent with basic ADLs and Independent with household mobility without device  PATIENT GOALS: Walk around the block with his wife. Be able to use the dining room chair rather than have to use the W/C. Increased I with bed mobility and simple tasks at home like make some coffee or brush his teeth, Step over tub to shower, has bench, but he can't really use it. Up and down the one step to get to back deck. 11/23/22: Patient's wife Amalia Badder updated his goals today: Walk around store (gets distracted), swing a golf club again (thinking of driving range), get up on his own, walk without leaning forward, more recognition of the left side (from CVA), not need gait belt, OT-- be able to get dressed more on his own, navigate the newspaper.  OBJECTIVE:  TREATMENT: 11/01/23 UBE level 4 x 6 minutes Nustep level 5 x 6 minutes Feet on ball K2C, rotation, small bridge, isometric abs Right hip extension blue tband supine Supine right hip abduction with sliding board Blue tband clamshells Bed mobility on his own with CGA Gait out the back door and then along the asphalt to his car at the front of the building with [facility]  10/27/23 Nustep level 5 x 6 minutes Bike level 5 x 5 minutes UBE level 4 x 5 minutes Seated partial sit ups Standing right arm 10# row/extension and then 5# left arm Standing with SPC 6" toe taps alternating Seated volley ball Seated 5# LAQ Seated elbow  touches for core motions Passive left shoulder stretches   10/11/23 Nustep level 5 x 7 minutes Seated volley ball In pbars on airex reaching for numbers Partial  sit ups Elbow touches for trunk and core Gait around the back parking [place] with HHA, needed one long seated rest break, needed a lot of cues, on the second half of [address] he was tired and really dragging his feet with significant forward trunk lean.   Passive motions of the left shoulder  10/06/23 Bike level 4 x 5 minutes Nustep level 5 x 5 minutes 3# LAQ 3# marches Seated volley ball Passive stretch left shoulder Partial sit ups On airex reaching for numbers on wall, trying to get him to use the left arm as well Gait outside with HHA half way around the parking [place] a short break and then walking in the grass from the front of the building to the car  09/27/23 Nustep level 5 x 6 minutes Bike level 4 x 6 minutes Seated volleyball Standing volleyball Partial sit ups TUG 27 seconds but needed ModA due to a LOB, the second attempt he did in 19 seconds but uncontrolled sitting Supine bridges 2# flexion and chest press with wate bar Supine ball reaching Supine to sit with min A gait with him out to the car   09/13/23 Nustep level 5 x 7 mintues Bike level 4 x 6 minutes Gait outside with HHA/CGA 250' x 2 Supine bridges Isometric abs Left leg abduction Supine to sitting with min A Seated volley ball Seated ball kicks  09/06/23 Home assessment with wife: Bathroom set up is very cramped and crowded.  Has over commode toilet and grab bars, they have a walker in front of the commode, the way it is set up he has to walk side ways into the set up and side ways out, I lowered the front legs and put them in the shower that is directly in front and had the back legs out, this gave him about 10" more to get b/n the toilet and the walker so he could walk in and then turn instead of side ways. Bed transfer from supine:  We did this 4 times, dropped the side rail, raised head of the bed and wife typically uses a strap that she gives [place] and he pulls up with it.  Today he got up without it  twice, we talked about protecting [name]/s back with leaning back and having a wide base of support, also to give [place] cues to lean forward as he tends to be retropulsive.   Shower:  Shelah Derry in and looked at this with Amalia Badder, gave some ideas as he is now holding grab bar and stepping over the tub to get in.  Asked about safer way of sitting and bringing legs over she said this was tried but they were unable to get his legs over. Went over HEP:  in his chair talked about reciprocal patterns and she looked on [place] for a pedal machine Gait: outside with HHA to the mail box and back cues for step length  08/25/23 Nustep level 5 x 6 minutes, cues for larger movements Bike level 4 x 6 minutes UBE level 3 x 5 mintues Worked on some bed mobility Bridges Partial sit ups Supine left leg sliding board hip abduction Seated volley ball with some cues to use the left hand Gait outside with hand hold assist and some cues for steps, some walking in grass  08/18/23 Nustep level 5  x 9 minutes some cues for larger motions Bike level 5 x 6 minutes UBE level 4 x 4 minutes Sitting on the mat table elbow touches a lot of cues to come to the left side Partial sit ups 3x10 Bridges 3x10 Hooklying marches Seated ball taps left hand and right hand Supine to sit x 2 with min A Gait with HHA 2 x 220 feet fast pace, cues for BIG steps  08/11/23  Nustep L5 all four extremities x8 minutes Mod cues for good machine use Lateral trunk crunches (limited on the L due to shoulder pain) with light ball toss in between x10 B  STS no UEs up to tip toes to help counter posterior lean when first standing up x10, up to MinA as fatigue increased  Tried double march in sitting with transition to march in standing but this was a bit overwhelming for him Alternating toe taps on 4 inch step progressing to 8 inch step with intermittent cues to focus on task at hand  Forward steps to target to promote improved step length, Mod multimodal  cues  Light L shoulder stretching for flexion to tolerance   07/26/23 Getting in and out of car with him due to rain, he struggled due to a lot going on and it seemed to fluster him some Nustep level 5 x 7 minutes UBE level 3 x 6 minutes Supine bridges Partial sit ups Standing ball toss and volleyball some cues for left hand Gait with HHA x 3 laps Practice with him and wife getting in and out of bed, x3  07/21/23 Gait outside with HHA on the right negotiating curbs, cues for step length and posture, around to the front door,  Nustep level 5 x 7 minutes 2.5# LAQ 2.5# [employer] with wife regarding renewal and what is going on with [place], she reports that in the morning he freezes when trying to go to the bathroom, and cannot sit, she also reports that she is using Max A to get him from supine to sit.  We practiced this with [place] in the bathroom and today he had no big issues of freezing. I started some education with wife on some physics of levers and fulcrums of trying to get him out of bed from supine Pball press for core work Retested [place] and 5XSTS  07/14/23 Nustep level 5 x 7 minutes UBE level 2 x 4 minutes Passive stretch hand, fingers, wrist, elbow and shoulder left Standing step turn reach touch Standing reaching for numbers on the wall with left hand and right Sit to stand with cues for both arms to push up. Gait outside with HHA 200 feet then rest and then 200 feet through some grass and uneven terrain to the car  PATIENT EDUCATION: Education details: none new 02/16/23 Person educated: Patient and Spouse Education method: Explanation, Demonstration, Tactile cues, and Verbal cues Education comprehension: verbalized understanding, returned demonstration, verbal cues required, tactile cues required, and needs further education   ------------------------------------------------------------------------------------------------------- (Measures in this section from initial evaluation  unless otherwise noted) DIAGNOSTIC FINDINGS:  MRI 2021 with degenerative changes in lumbar spine, L3-4 R subarticular stenosis COGNITION: Overall cognitive status: History of cognitive impairments - at baseline SENSATION: Not tested COORDINATION: Moderately impaired BLE MUSCLE LENGTH: Hamstrings: severely restricted B Thomas test: Severely restricted B. POSTURE: rounded shoulders, forward head, increased thoracic kyphosis, posterior pelvic tilt, and flexed trunk  LOWER EXTREMITY ROM:   BLE extremely stiff throughout, limited hip ROM in all planes, B knees  limited in extension. LOWER EXTREMITY MMT:  3+/5 throughout. Unable to determine accuracy of MMT due to cognition. Functionally demosntrates poor coordinated activation and poor muscular endurance. BED MOBILITY: Min A for Supine<> Sit. Patient was using a ladder in his hospital bed and could move MI. TRANSFERS: Min A, mod VC, tends to lean back.  11/02/22 independent with use of hands on arm chair with a couple of tries CURB: Min A-Has one step out to his deck. STAIRS: N/A  GAIT: Gait pattern: step to pattern, decreased arm swing- Right, decreased arm swing- Left, decreased step length- Right, decreased step length- Left, shuffling, festinating, trunk flexed, narrow BOS, poor foot clearance- Right, and poor foot clearance- Left Distance walked: 12' Assistive device utilized: Environmental consultant - 2 wheeled Level of assistance: Occasional min A Comments: mod VC to stay close to walker, take longer steps. FUNCTIONAL TESTS:  5 times sit to stand: 38.23  09/29/22  could not performs 5XSTS, 11/02/22 = 47 seconds with cues to use arms, 07/21/23 really struggled today 55 seconds Timed up and go (TUG): 47.91 with RW, 07/21/22 TUG 29  seconds without device, 09/29/22 25 seconds, 11/02/22 TUG 20 seconds, 07/21/23 = 26 seconds no device SBA                                                                     PATIENT EDUCATION: Education details: Progress towards goals,  POC Person educated: Patient and Spouse Education method: Programmer, multimedia, Facilities manager, and Verbal cues  Education comprehension: verbalized understanding, returned demonstration, verbal cues required, and needs further education  HOME EXERCISE PROGRAM: PWR! moves  GOALS: Goals reviewed with patient? Yes  SHORT TERM GOALS: Target date: 12/22/22  Perform HEP for PWR! Moves with cues from family. Baseline: Pt's wife, Amalia Badder, notes walking and hitting beach ball as main HEP. Goal status: GOAL MET, 12/20/2022   2. Decrease 5 x STS to < 35 sec to demonstrate improved functional strength  Baseline: 47 seconds on 5/21 per note; 34.5 sec UE support 12/20/2022  Goal status: GOAL MET, 12/20/2022   3. Perform sit <> stand transfers from average chair height with Supervision.  Baseline: CGA  12/20/2022; (01/10/23) CGA  Goal status: NOT MET  LONG TERM GOALS: Target date: 04/22/23  Patient will perform HEP progression with caregiver assist. Baseline: has minimal HEP Goal status: progressing  2.  Decrease 5 x STS to < 25 sec to demonstrate improved functional strength Baseline: 47 seconds on 5/21 per note; (01/10/23) 36 sec w/ cues for speed/sequence>30.38 sec with BUE supprot Goal status: PARTIALLY MET, 01/24/2023  3. Improve quality of gait to be able to ambulate x 100 feet with supervision. Baseline: CGA with cues  Goal status: Met 08/18/23  4.  Patient will perform his bed mobility with MI including rolling, sit<>supine, and scooting in bed. Baseline: on hard surface mat he is independent but on soft bed at home he struggles, wife reports only needs a hand to get up now 11/02/22; (01/10/23) modified indep.  Goal status: MET  5.  Patient will demonstrate floor<>stand with CGA and verbal cues for patient's wife to be able to assist. Baseline:  Has to call 9-1-1; +2 max assist floor>stand with UE support 01/19/2023 Goal status: GOAL NOT MET 01/19/2023  **UPDATED  GOALS BELOW ** per recert 05/17/2023  SHORT  TERM GOALS: Target date: 02/25/2023   Pt will perform HEP to maintain lower body strength, flexibility, posture with wife's assistance. Baseline:  Goal status: met 04/12/23  2. Pt will perform sit to stand, 8 of 10 reps with BUE support, with supervision, to maintain safety with transfers.  Baseline: min guard  Goal status: 02/07/23-CGA from elevated surface and max VC to lean forward, ongoing   LONG TERM GOALS: Target date: 07/18/23   Patient will perform HEP progression to maintain lower body strength, flexibility, posture with wife's assistance. Baseline:  Goal status: ongoing but progressing 08/25/23  2.  Pt will ambulate 150-175 ft in 2 MWT with min guard assist, to preserve functional mobility within the home.  Baseline: Goal status:  met 08/25/23  3. Wife will verbalize tips for fall prevention education to minimize fall frequency Baseline:  Goal status:progressing and doing well 4.  Patient will be able to toilet without freezing for over a 4 week period 5.  Patient will be able to get out of bed from supine with wife assist minimal Assist,met 10/06/23   ASSESSMENT:  CLINICAL IMPRESSION:  Patient a little more resistant to walking outside, reports that he is tired after and sometimes will have some back pain.  He was walking with very small steps today, once we got outside he did better.  With the harder bed he can do up and down from supine with SBA/CGA OBJECTIVE IMPAIRMENTS: Abnormal gait, decreased activity tolerance, decreased balance, decreased cognition, decreased coordination, decreased endurance, decreased mobility, difficulty walking, decreased ROM, decreased strength, decreased safety awareness, impaired flexibility, impaired UE functional use, improper body mechanics, and postural dysfunction.   PLAN:  PT FREQUENCY: 1x/week  PT DURATION: 12 weeks  PLANNED INTERVENTIONS: Therapeutic exercises, Therapeutic activity, Neuromuscular re-education, Balance training, Gait  training, Patient/Family education, Self Care, Joint mobilization, Stair training, Moist heat, and Manual therapy  PLAN FOR NEXT SESSION:   work on core and reciprocal patterns, gait and transfers Patient Details  Name: ARTEZ REGIS MRN: [id number] Date of Birth: 14-Apr-1941 Referring Provider:  Crecencio Dodge, Candida Chalk, *  Cherylene Corrente, PT

## 2023-11-08 ENCOUNTER — Encounter (HOSPITAL_COMMUNITY): Payer: Self-pay | Admitting: Psychiatry

## 2023-11-08 ENCOUNTER — Other Ambulatory Visit: Payer: Self-pay

## 2023-11-08 ENCOUNTER — Other Ambulatory Visit (HOSPITAL_BASED_OUTPATIENT_CLINIC_OR_DEPARTMENT_OTHER): Payer: Self-pay

## 2023-11-08 ENCOUNTER — Ambulatory Visit (HOSPITAL_COMMUNITY): Payer: PPO | Admitting: Psychiatry

## 2023-11-08 VITALS — BP 150/95 | HR 63 | Ht 69.0 in | Wt 141.0 lb

## 2023-11-08 DIAGNOSIS — F0153 Vascular dementia, unspecified severity, with mood disturbance: Secondary | ICD-10-CM | POA: Diagnosis not present

## 2023-11-08 DIAGNOSIS — G4700 Insomnia, unspecified: Secondary | ICD-10-CM | POA: Diagnosis not present

## 2023-11-08 MED ORDER — TRAZODONE HCL 150 MG PO TABS
75.0000 mg | ORAL_TABLET | Freq: Every day | ORAL | 6 refills | Status: DC
  Filled 2023-11-08: qty 20, 40d supply, fill #0
  Filled 2023-12-29: qty 20, 40d supply, fill #1
  Filled 2024-02-10: qty 20, 40d supply, fill #2
  Filled 2024-03-23: qty 20, 40d supply, fill #3
  Filled 2024-05-07: qty 20, 40d supply, fill #4

## 2023-11-08 MED ORDER — CARBAMAZEPINE 100 MG/5ML PO SUSP
ORAL | 3 refills | Status: DC
  Filled 2023-11-08 – 2023-11-17 (×3): qty 600, 30d supply, fill #0
  Filled 2023-12-19: qty 600, 30d supply, fill #1
  Filled 2024-01-11: qty 600, 30d supply, fill #2
  Filled 2024-02-10: qty 600, 30d supply, fill #3

## 2023-11-08 MED ORDER — LORAZEPAM 1 MG PO TABS
ORAL_TABLET | ORAL | 4 refills | Status: DC
  Filled 2023-11-08 – 2024-02-20 (×2): qty 60, 30d supply, fill #0
  Filled 2024-03-26: qty 60, 30d supply, fill #1
  Filled 2024-04-16 – 2024-04-25 (×4): qty 60, 30d supply, fill #2

## 2023-11-08 NOTE — Progress Notes (Signed)
       Today's date is 11/08/2023.  Patient is seen with his wife Amalia Badder.  The patient is actually doing better.  His weight is stable.  He is eating.  He is alert and friendly.  The patient goes to [facility] 3 days a week.  He denies daily depression and he denies anxiety.  He has no evidence of psychosis.  He seems to be calmer and less irritable.  He is not oversedated.  He is wheelchair bound.  His wife is very supportive.  He has had a right CVA with left-sided paresis.  He reads the paper every day.  The patient is calm and compliant.  He cooperates well with his wife who is his caregiver.      This is the assessment and plan for 11/08/2023.  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  The patient's dementing process is very slow and very stable.  He is functioning very well.  He continues taking Tegretol  which reduce his irritability.  He also takes Ativan  0.5 mg in the morning midday and 1 mg at night.  He takes Desyrel  for sleep which works well.  He is sleeping and eating well and showing no behavioral disturbances.  He will return to see me in 5 months.   Today's date is 03/29/2023.  The patient is diagnosed with Parkinson's dementia.  He is also diagnosed with an adjustment disorder with anxious mood state.  This is a state that requires him to be on a low dose of Ativan  that he has been taking now for years.  It is very helpful for sleep.  He also takes a little bit in the morning and some in the afternoon which helps his agitation.  Says both for helping him sleep but also having an antiagitation affect.  The Ativan  together with his Tegretol  have been very effective.  For sleep he also takes some trazodone .

## 2023-11-10 ENCOUNTER — Encounter: Payer: Self-pay | Admitting: Physical Therapy

## 2023-11-10 ENCOUNTER — Ambulatory Visit: Admitting: Physical Therapy

## 2023-11-10 DIAGNOSIS — R2689 Other abnormalities of gait and mobility: Secondary | ICD-10-CM

## 2023-11-10 DIAGNOSIS — M6281 Muscle weakness (generalized): Secondary | ICD-10-CM | POA: Diagnosis not present

## 2023-11-10 DIAGNOSIS — I69354 Hemiplegia and hemiparesis following cerebral infarction affecting left non-dominant side: Secondary | ICD-10-CM

## 2023-11-10 DIAGNOSIS — R262 Difficulty in walking, not elsewhere classified: Secondary | ICD-10-CM

## 2023-11-10 DIAGNOSIS — Z9181 History of falling: Secondary | ICD-10-CM

## 2023-11-10 DIAGNOSIS — M25512 Pain in left shoulder: Secondary | ICD-10-CM

## 2023-11-10 DIAGNOSIS — R296 Repeated falls: Secondary | ICD-10-CM

## 2023-11-10 NOTE — Therapy (Signed)
 OUTPATIENT PHYSICAL THERAPY NEURO TREATMENT  Patient Name: [name] MRN: [id number] DOB:09-Jan-1941, 83 y.o., male Today's Date: 11/10/2023  PCP: Estill Hemming  DO REFERRING PROVIDER: same   [name] OF SESSION:  PT [name] of Session - 11/10/23 1406     Visit Number [id number]    Date for PT Re-Evaluation 11/27/23    Authorization Type HTA    PT Start Time 1400    PT Stop Time 1445    PT Time Calculation (min) 45 min    Activity Tolerance Patient tolerated treatment well    Behavior During Therapy [facility] for tasks assessed/performed                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   HTN (hypertension) 07/19/2015   Hyperlipidemia    Major neurocognitive disorder 01/09/2014   Mild, related to stroke history   Nocturia    Renal insufficiency 06/25/2013   S/P carotid endarterectomy    BILATERAL ICA--  PATENT PER DUPLEX  05-19-2012   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/26/2017   Right calf   Urinary frequency    Vitamin D  deficiency    Past Surgical History:  Procedure Laterality Date   APPENDECTOMY  AS CHILD   CARDIOVASCULAR STRESS TEST  03-27-2012  DR CRENSHAW   LOW RISK LEXISCAN  STUDY-- PROBABLE NORMAL PERFUSION AND SOFT TISSUE ATTENUATION/  NO ISCHEMIA/ EF 51%   CAROTID ENDARTERECTOMY Bilateral LEFT  11-12-2008  DR GREG HAYES   RIGHT ICA  2006  ([facility])   CHOLECYSTECTOMY N/A 02/23/2022   Procedure: LAPAROSCOPIC CHOLECYSTECTOMY;  Surgeon: Junie Olds, MD;  Location: [facility] OR;  Service: General;  Laterality: N/A;   CYSTOSCOPY W/ RETROGRADES  Bilateral 06/22/2021   Procedure: CYSTOSCOPY WITH RETROGRADE PYELOGRAM;  Surgeon: Trent Frizzle, MD;  Location: [facility];  Service: Urology;  Laterality: Bilateral;   CYSTOSCOPY WITH LITHOLAPAXY N/A 02/26/2013   Procedure: CYSTOSCOPY WITH LITHOLAPAXY;  Surgeon: Trent Frizzle, MD;  Location: [facility];  Service: Urology;  Laterality: N/A;   ENDOSCOPIC RETROGRADE CHOLANGIOPANCREATOGRAPHY (ERCP) WITH PROPOFOL  N/A 02/22/2022   Procedure: ENDOSCOPIC RETROGRADE CHOLANGIOPANCREATOGRAPHY (ERCP) WITH PROPOFOL ;  Surgeon: Alvis Jourdain, MD;  Location[facility] ENDOSCOPY;  Service: Gastroenterology;  Laterality: N/A;   EYE SURGERY  Jan. 2016   cataract surgery both eyes   INGUINAL HERNIA REPAIR Right 11-08-2006   IR KYPHO EA ADDL LEVEL THORACIC OR LUMBAR  02/12/2021   IR RADIOLOGIST EVAL & MGMT  02/18/2021   MASS EXCISION N/A 03/03/2016   Procedure: EXCISION OF BACK  MASS;  Surgeon: Lockie Rima, MD;  Location: [facility];  Service: General;  Laterality: N/A;   MOHS SURGERY Left 1/ 2016   Dr Del Favia-- Basal cell   PROSTATE SURGERY     REMOVAL OF STONES  02/22/2022   Procedure: REMOVAL OF STONES;  Surgeon: Alvis Jourdain, MD;  Location: [facility] ENDOSCOPY;  Service: Gastroenterology;;   [address]  02/22/2022   Procedure: [address];  Surgeon: Alvis Jourdain, MD;  Location: [facility] ENDOSCOPY;  Service: Gastroenterology;;   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 N/A 06/22/2021   Procedure: TRANSURETHRAL RESECTION OF BLADDER TUMOR;  Surgeon: Trent Frizzle, MD;  Location: [facility];  Service: Urology;  Laterality: N/A;   TRANSURETHRAL RESECTION OF PROSTATE N/A 02/26/2013   Procedure: TRANSURETHRAL RESECTION OF THE PROSTATE WITH GYRUS INSTRUMENTS;  Surgeon: Trent Frizzle, MD;  Location: [facility];  Service: Urology;  Laterality: N/A;   TRANSURETHRAL RESECTION OF PROSTATE N/A 06/22/2021   Procedure: TRANSURETHRAL  RESECTION OF THE PROSTATE (TURP);  Surgeon: Trent Frizzle, MD;  Location: [facility];  Service: Urology;  Laterality: N/A;   Patient Active Problem List   Diagnosis Date Noted   Bronchitis 06/16/2023   Impacted cerumen of left ear 06/16/2023   Acute encephalopathy 04/18/2023   Parkinson's disease (HCC) 04/18/2023   History of CVA with residual deficit 04/18/2023   Chronic kidney disease, stage 3b (HCC) 04/18/2023   Dementia with behavioral disturbance (HCC) 04/18/2023   Anemia of chronic disease 04/18/2023   Laceration of right upper extremity 02/24/2023   Dermatitis 02/24/2023   Lung nodules 02/24/2023   Dysuria 08/03/2022   Nonintractable headache 07/01/2022   Bilateral impacted cerumen 06/24/2022   Rash 06/15/2022   Postoperative ileus (HCC) 02/27/2022   Ileus, postoperative (HCC) 02/26/2022   Choledocholithiasis 02/19/2022   DNR (do not resuscitate) 02/19/2022   Left elbow pain 01/26/2022   Mid back pain on left side 01/26/2022   Rib pain 01/26/2022   Acute pain of left shoulder 11/12/2021   Leukocytes in urine 11/12/2021   Urinary frequency 11/12/2021   Thrush 10/08/2021   Hemiplegia, 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   Insomnia    Prediabetes    Acute 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 (HCC)    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   Transaminitis 07/27/2021   UTI (urinary tract infection) 07/27/2021   CVA (cerebral vascular accident) (HCC) 07/27/2021   Fall 07/27/2021   Hyperglycemia 07/27/2021   Cholelithiasis 07/27/2021   Hypoxia 07/27/2021   Nausea and vomiting 07/27/2021   Acute metabolic encephalopathy 07/27/2021   Normocytic anemia 07/27/2021   Chronic back pain 07/27/2021   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   Closed fracture of first lumbar vertebra with routine healing 02/03/2021   Closed fracture of multiple ribs 11/18/2020   Anxiety  01/29/2020   Leg pain, bilateral 01/29/2020   Ingrown toenail 07/13/2019   Lumbar spondylosis 05/02/2018   Pain in left knee 03/09/2018   Osteoarthritis of left hip 01/16/2018   Trochanteric bursitis of left hip 01/16/2018   Preventative health care 09/26/2017   HTN (hypertension) 07/19/2015   Hyperlipidemia 07/19/2015   Great toe pain 02/11/2014   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   Obesity (BMI 30-39.9) 06/25/2013   Renal insufficiency 06/25/2013   Weakness of left arm 06/25/2013   Sebaceous cyst 03/03/2011   Sprain of lumbar region 07/31/2010   Rib pain, left 08/29/2009   Carotid artery stenosis, asymptomatic, bilateral 05/02/2009   Eczema, atopic 05/31/2008   Vitamin D  deficiency 03/01/2008   BPH (benign prostatic hyperplasia) 08/06/2007   Fasting hyperglycemia 12/21/2006   History of right MCA infarct 2006    ONSET DATE: 06/29/22 REFERRING DIAG:  Diagnosis  I63.9 (ICD-10-CM) - Cerebrovascular accident (CVA), unspecified mechanism (HCC)  W19.XXXD (ICD-10-CM) - Fall, subsequent encounter    THERAPY DIAG:  Muscle weakness (generalized)  Other abnormalities of gait and mobility  Hemiplegia and hemiparesis following cerebral infarction affecting left non-dominant side (HCC)  Repeated falls  Difficulty in walking, not elsewhere classified  Acute pain of left shoulder  History of falling  Rationale for Evaluation and Treatment: Rehabilitation  SUBJECTIVE:                                                                                                                                                                                         SUBJECTIVE STATEMENT:  No falls, wife reports doing okay, better with transfers  PERTINENT HISTORY:  EDMAN LIPSEY is a 83 year old man with dementia, CKD, HTN, CAD, HLD and history of CVA  (2006, 2023), Parkinson's  PAIN:  Are you having pain? Faces/behavior scale- mild pain BLEs  and back   PRECAUTIONS:  Fall  WEIGHT BEARING RESTRICTIONS: No  FALLS: Has patient fallen in last 6 months? No  LIVING ENVIRONMENT: Lives with: lives with their family and lives with their spouse Lives in: House/apartment Stairs: 1 brick high step Has following equipment at home: Environmental consultant - 2 wheeled, Wheelchair (manual), Shower bench, bed side commode, Grab bars, and hospital bed, sliding pad for car  PLOF: Independent with basic ADLs and Independent with household mobility without device  PATIENT GOALS: Walk around the block with his wife. Be able to use the dining room chair rather than have to use the W/C. Increased I with bed mobility and simple tasks at home like make some coffee or brush his teeth, Step over tub to shower, has bench, but he can't really use it. Up and down the one step to get to back deck. 11/23/22: Patient's wife Amalia Badder updated his goals today: Walk around store (gets distracted), swing a golf club again (thinking of driving range), get up on his own, walk without leaning forward, more recognition of the left side (from CVA), not need gait belt, OT-- be able to get dressed more on his own, navigate the newspaper.  OBJECTIVE:  TREATMENT: 11/10/23 UBE level 4 x 6 minutes Nustep level 5 x 6 minutes Partial sit ups  2.5# hip marches 2.5# LAQ Standing ball toss Standing ball kicks Side steps Gait with HHA outside out back door and around the the car  11/01/23 UBE level 4 x 6 minutes Nustep level 5 x 6 minutes Feet on ball K2C, rotation, small bridge, isometric abs Right hip extension blue tband supine Supine right hip abduction with sliding board Blue tband clamshells Bed mobility on his own with CGA Gait out the back door and then along the asphalt to his car at the front of the building with [facility]  10/27/23 Nustep level 5 x  6 minutes Bike level 5 x 5 minutes UBE level 4 x 5 minutes Seated partial sit ups Standing right arm 10# row/extension and then 5# left arm Standing with SPC 6"  toe taps alternating Seated volley ball Seated 5# LAQ Seated elbow touches for core motions Passive left shoulder stretches   10/11/23 Nustep level 5 x 7 minutes Seated volley ball In pbars on airex reaching for numbers Partial sit ups Elbow touches for trunk and core Gait around the back parking [place] with HHA, needed one long seated rest break, needed a lot of cues, on the second half of [address] he was tired and really dragging his feet with significant forward trunk lean.   Passive motions of the left shoulder  10/06/23 Bike level 4 x 5 minutes Nustep level 5 x 5 minutes 3# LAQ 3# marches Seated volley ball Passive stretch left shoulder Partial sit ups On airex reaching for numbers on wall, trying to get him to use the left arm as well Gait outside with HHA half way around the parking [place] a short break and then walking in the grass from the front of the building to the car  09/27/23 Nustep level 5 x 6 minutes Bike level 4 x 6 minutes Seated volleyball Standing volleyball Partial sit ups TUG 27 seconds but needed ModA due to a LOB, the second attempt he did in 19 seconds but uncontrolled sitting Supine bridges 2# flexion and chest press with wate bar Supine ball reaching Supine to sit with min A gait with him out to the car   09/13/23 Nustep level 5 x 7 mintues Bike level 4 x 6 minutes Gait outside with HHA/CGA 250' x 2 Supine bridges Isometric abs Left leg abduction Supine to sitting with min A Seated volley ball Seated ball kicks  09/06/23 Home assessment with wife: Bathroom set up is very cramped and crowded.  Has over commode toilet and grab bars, they have a walker in front of the commode, the way it is set up he has to walk side ways into the set up and side ways out, I lowered the front legs and put them in the shower that is directly in front and had the back legs out, this gave him about 10" more to get b/n the toilet and the walker so he could walk in  and then turn instead of side ways. Bed transfer from supine:  We did this 4 times, dropped the side rail, raised head of the bed and wife typically uses a strap that she gives William [name] and he pulls up with it.  Today he got up without it twice, we talked about protecting [name]/s back with leaning back and having a wide base of support, also to give William [name] cues to lean forward as he tends to be retropulsive.   Shower:  Shelah Derry in and looked at this with Amalia Badder, gave some ideas as he is now holding grab bar and stepping over the tub to get in.  Asked about safer way of sitting and bringing legs over she said this was tried but they were unable to get his legs over. Went over HEP:  in his chair talked about reciprocal patterns and she looked on [place] for a pedal machine Gait: outside with HHA to the mail box and back cues for step length  08/25/23 Nustep level 5 x 6 minutes, cues for larger movements Bike level 4 x 6 minutes UBE level 3 x 5 mintues Worked on some bed  mobility Bridges Partial sit ups Supine left leg sliding board hip abduction Seated volley ball with some cues to use the left hand Gait outside with hand hold assist and some cues for steps, some walking in grass  08/18/23 Nustep level 5 x 9 minutes some cues for larger motions Bike level 5 x 6 minutes UBE level 4 x 4 minutes Sitting on the mat table elbow touches a lot of cues to come to the left side Partial sit ups 3x10 Bridges 3x10 Hooklying marches Seated ball taps left hand and right hand Supine to sit x 2 with min A Gait with HHA 2 x 220 feet fast pace, cues for BIG steps  08/11/23  Nustep L5 all four extremities x8 minutes Mod cues for good machine use Lateral trunk crunches (limited on the L due to shoulder pain) with light ball toss in between x10 B  STS no UEs up to tip toes to help counter posterior lean when first standing up x10, up to MinA as fatigue increased  Tried double march in sitting with transition to march in  standing but this was a bit overwhelming for him Alternating toe taps on 4 inch step progressing to 8 inch step with intermittent cues to focus on task at hand  Forward steps to target to promote improved step length, Mod multimodal cues  Light L shoulder stretching for flexion to tolerance   07/26/23 Getting in and out of car with him due to rain, he struggled due to a lot going on and it seemed to fluster him some Nustep level 5 x 7 minutes UBE level 3 x 6 minutes Supine bridges Partial sit ups Standing ball toss and volleyball some cues for left hand Gait with HHA x 3 laps Practice with him and wife getting in and out of bed, x3  07/21/23 Gait outside with HHA on the right negotiating curbs, cues for step length and posture, around to the front door,  Nustep level 5 x 7 minutes 2.5# LAQ 2.5# [employer] with wife regarding renewal and what is going on with William [name], she reports that in the morning he freezes when trying to go to the bathroom, and cannot sit, she also reports that she is using Max A to get him from supine to sit.  We practiced this with William [name] in the bathroom and today he had no big issues of freezing. I started some education with wife on some physics of levers and fulcrums of trying to get him out of bed from supine Pball press for core work Retested [place] and 5XSTS  07/14/23 Nustep level 5 x 7 minutes UBE level 2 x 4 minutes Passive stretch hand, fingers, wrist, elbow and shoulder left Standing step turn reach touch Standing reaching for numbers on the wall with left hand and right Sit to stand with cues for both arms to push up. Gait outside with HHA 200 feet then rest and then 200 feet through some grass and uneven terrain to the car  PATIENT EDUCATION: Education details: none new 02/16/23 Person educated: Patient and Spouse Education method: Explanation, Demonstration, Tactile cues, and Verbal cues Education comprehension: verbalized understanding, returned  demonstration, verbal cues required, tactile cues required, and needs further education   ------------------------------------------------------------------------------------------------------- (Measures in this section from initial evaluation unless otherwise noted) DIAGNOSTIC FINDINGS:  MRI 2021 with degenerative changes in lumbar spine, L3-4 R subarticular stenosis COGNITION: Overall cognitive status: History of cognitive impairments - at baseline SENSATION: Not tested COORDINATION: Moderately impaired BLE  MUSCLE LENGTH: Hamstrings: severely restricted B Thomas test: Severely restricted B. POSTURE: rounded shoulders, forward head, increased thoracic kyphosis, posterior pelvic tilt, and flexed trunk  LOWER EXTREMITY ROM:   BLE extremely stiff throughout, limited hip ROM in all planes, B knees limited in extension. LOWER EXTREMITY MMT:  3+/5 throughout. Unable to determine accuracy of MMT due to cognition. Functionally demosntrates poor coordinated activation and poor muscular endurance. BED MOBILITY: Min A for Supine<> Sit. Patient was using a ladder in his hospital bed and could move MI. TRANSFERS: Min A, mod VC, tends to lean back.  11/02/22 independent with use of hands on arm chair with a couple of tries CURB: Min A-Has one step out to his deck. STAIRS: N/A  GAIT: Gait pattern: step to pattern, decreased arm swing- Right, decreased arm swing- Left, decreased step length- Right, decreased step length- Left, shuffling, festinating, trunk flexed, narrow BOS, poor foot clearance- Right, and poor foot clearance- Left Distance walked: 67' Assistive device utilized: Environmental consultant - 2 wheeled Level of assistance: Occasional min A Comments: mod VC to stay close to walker, take longer steps. FUNCTIONAL TESTS:  5 times sit to stand: 38.23  09/29/22  could not performs 5XSTS, 11/02/22 = 47 seconds with cues to use arms, 07/21/23 really struggled today 55 seconds Timed up and go (TUG): 47.91 with RW,  07/21/22 TUG 29  seconds without device, 09/29/22 25 seconds, 11/02/22 TUG 20 seconds, 07/21/23 = 26 seconds no device SBA                                                                     PATIENT EDUCATION: Education details: Progress towards goals, POC Person educated: Patient and Spouse Education method: Programmer, multimedia, Facilities manager, and Verbal cues  Education comprehension: verbalized understanding, returned demonstration, verbal cues required, and needs further education  HOME EXERCISE PROGRAM: PWR! moves  GOALS: Goals reviewed with patient? Yes  SHORT TERM GOALS: Target date: 12/22/22  Perform HEP for PWR! Moves with cues from family. Baseline: Pt's wife, Amalia Badder, notes walking and hitting beach ball as main HEP. Goal status: GOAL MET, 12/20/2022   2. Decrease 5 x STS to < 35 sec to demonstrate improved functional strength  Baseline: 47 seconds on 5/21 per note; 34.5 sec UE support 12/20/2022  Goal status: GOAL MET, 12/20/2022   3. Perform sit <> stand transfers from average chair height with Supervision.  Baseline: CGA  12/20/2022; (01/10/23) CGA  Goal status: NOT MET  LONG TERM GOALS: Target date: 04/22/23  Patient will perform HEP progression with caregiver assist. Baseline: has minimal HEP Goal status: progressing  2.  Decrease 5 x STS to < 25 sec to demonstrate improved functional strength Baseline: 47 seconds on 5/21 per note; (01/10/23) 36 sec w/ cues for speed/sequence>30.38 sec with BUE supprot Goal status: PARTIALLY MET, 01/24/2023  3. Improve quality of gait to be able to ambulate x 100 feet with supervision. Baseline: CGA with cues  Goal status: Met 08/18/23  4.  Patient will perform his bed mobility with MI including rolling, sit<>supine, and scooting in bed. Baseline: on hard surface mat he is independent but on soft bed at home he struggles, wife reports only needs a hand to get up now 11/02/22; (01/10/23) modified indep.  Goal status:  MET  5.  Patient will demonstrate  floor<>stand with CGA and verbal cues for patient's wife to be able to assist. Baseline:  Has to call 9-1-1; +2 max assist floor>stand with UE support 01/19/2023 Goal status: GOAL NOT MET 01/19/2023  **UPDATED GOALS BELOW ** per recert 05/17/2023  SHORT TERM GOALS: Target date: 02/25/2023   Pt will perform HEP to maintain lower body strength, flexibility, posture with wife's assistance. Baseline:  Goal status: met 04/12/23  2. Pt will perform sit to stand, 8 of 10 reps with BUE support, with supervision, to maintain safety with transfers.  Baseline: min guard  Goal status: 02/07/23-CGA from elevated surface and max VC to lean forward, ongoing   LONG TERM GOALS: Target date: 07/18/23   Patient will perform HEP progression to maintain lower body strength, flexibility, posture with wife's assistance. Baseline:  Goal status: ongoing but progressing 08/25/23  2.  Pt will ambulate 150-175 ft in 2 MWT with min guard assist, to preserve functional mobility within the home.  Baseline: Goal status:  met 08/25/23  3. Wife will verbalize tips for fall prevention education to minimize fall frequency Baseline:  Goal status:progressing and doing well 4.  Patient will be able to toilet without freezing for over a 4 week period 5.  Patient will be able to get out of bed from supine with wife assist minimal Assist,met 10/06/23   ASSESSMENT:  CLINICAL IMPRESSION:  Patient did a very well today, there was a student observing and he enjoyed interacting with her and did better with step length, still shuffling but once outside he did much better OBJECTIVE IMPAIRMENTS: Abnormal gait, decreased activity tolerance, decreased balance, decreased cognition, decreased coordination, decreased endurance, decreased mobility, difficulty walking, decreased ROM, decreased strength, decreased safety awareness, impaired flexibility, impaired UE functional use, improper body mechanics, and postural dysfunction.   PLAN:  PT  FREQUENCY: 1x/week  PT DURATION: 12 weeks  PLANNED INTERVENTIONS: Therapeutic exercises, Therapeutic activity, Neuromuscular re-education, Balance training, Gait training, Patient/Family education, Self Care, Joint mobilization, Stair training, Moist heat, and Manual therapy  PLAN FOR NEXT SESSION:   work on core and reciprocal patterns, gait and transfers Patient Details  Name: MAXIME BECKNER MRN: [id number] Date of Birth: 09/01/1940 Referring Provider:  Crecencio Dodge, Candida Chalk, *  Cherylene Corrente, PT

## 2023-11-14 ENCOUNTER — Other Ambulatory Visit (HOSPITAL_BASED_OUTPATIENT_CLINIC_OR_DEPARTMENT_OTHER): Payer: Self-pay

## 2023-11-15 ENCOUNTER — Ambulatory Visit: Attending: Family Medicine | Admitting: Physical Therapy

## 2023-11-15 ENCOUNTER — Encounter: Payer: Self-pay | Admitting: Physical Therapy

## 2023-11-15 DIAGNOSIS — R262 Difficulty in walking, not elsewhere classified: Secondary | ICD-10-CM | POA: Diagnosis not present

## 2023-11-15 DIAGNOSIS — M6281 Muscle weakness (generalized): Secondary | ICD-10-CM | POA: Diagnosis not present

## 2023-11-15 DIAGNOSIS — Z9181 History of falling: Secondary | ICD-10-CM | POA: Diagnosis not present

## 2023-11-15 DIAGNOSIS — R2689 Other abnormalities of gait and mobility: Secondary | ICD-10-CM | POA: Diagnosis not present

## 2023-11-15 DIAGNOSIS — I6523 Occlusion and stenosis of bilateral carotid arteries: Secondary | ICD-10-CM | POA: Insufficient documentation

## 2023-11-15 DIAGNOSIS — M25512 Pain in left shoulder: Secondary | ICD-10-CM | POA: Diagnosis not present

## 2023-11-15 DIAGNOSIS — R296 Repeated falls: Secondary | ICD-10-CM | POA: Diagnosis not present

## 2023-11-15 DIAGNOSIS — I69354 Hemiplegia and hemiparesis following cerebral infarction affecting left non-dominant side: Secondary | ICD-10-CM | POA: Diagnosis not present

## 2023-11-15 NOTE — Therapy (Signed)
 OUTPATIENT PHYSICAL THERAPY NEURO TREATMENT  Patient Name: William [name] MRN: [id number] DOB:03-14-1941, 83 y.o., male Today's Date: 11/15/2023  PCP: Estill Hemming  DO REFERRING PROVIDER: same   END OF SESSION:  PT End of Session - 11/15/23 1537     Visit Number 80    Date for PT Re-Evaluation 11/27/23    Authorization Type HTA    PT Start Time 1530    PT Stop Time 1613    PT Time Calculation (min) 43 min    Activity Tolerance Patient tolerated treatment well    Behavior During Therapy [facility] for tasks assessed/performed                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   HTN (hypertension) 07/19/2015   Hyperlipidemia    Major neurocognitive disorder 01/09/2014   Mild, related to stroke history   Nocturia    Renal insufficiency 06/25/2013   S/P carotid endarterectomy    BILATERAL ICA--  PATENT PER DUPLEX  05-19-2012   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/26/2017   Right calf   Urinary frequency    Vitamin D  deficiency    Past Surgical History:  Procedure Laterality Date   APPENDECTOMY  AS CHILD   CARDIOVASCULAR STRESS TEST  03-27-2012  DR CRENSHAW   LOW RISK LEXISCAN  STUDY-- PROBABLE NORMAL PERFUSION AND SOFT TISSUE ATTENUATION/  NO ISCHEMIA/ EF 51%   CAROTID ENDARTERECTOMY Bilateral LEFT  11-12-2008  DR GREG HAYES   RIGHT ICA  2006  ([facility])   CHOLECYSTECTOMY N/A 02/23/2022   Procedure: LAPAROSCOPIC CHOLECYSTECTOMY;  Surgeon: Junie Olds, MD;  Location: [facility] OR;  Service: General;  Laterality: N/A;   CYSTOSCOPY W/ RETROGRADES  Bilateral 06/22/2021   Procedure: CYSTOSCOPY WITH RETROGRADE PYELOGRAM;  Surgeon: Trent Frizzle, MD;  Location: [facility];  Service: Urology;  Laterality: Bilateral;   CYSTOSCOPY WITH LITHOLAPAXY N/A 02/26/2013   Procedure: CYSTOSCOPY WITH LITHOLAPAXY;  Surgeon: Trent Frizzle, MD;  Location: [facility];  Service: Urology;  Laterality: N/A;   ENDOSCOPIC RETROGRADE CHOLANGIOPANCREATOGRAPHY (ERCP) WITH PROPOFOL  N/A 02/22/2022   Procedure: ENDOSCOPIC RETROGRADE CHOLANGIOPANCREATOGRAPHY (ERCP) WITH PROPOFOL ;  Surgeon: Alvis Jourdain, MD;  Location: [facility] ENDOSCOPY;  Service: Gastroenterology;  Laterality: N/A;   EYE SURGERY  Jan. 2016   cataract surgery both eyes   INGUINAL HERNIA REPAIR Right 11-08-2006   IR KYPHO EA ADDL LEVEL THORACIC OR LUMBAR  02/12/2021   IR RADIOLOGIST EVAL & MGMT  02/18/2021   MASS EXCISION N/A 03/03/2016   Procedure: EXCISION OF BACK  MASS;  Surgeon: Lockie Rima, MD;  Location: [facility];  Service: General;  Laterality: N/A;   MOHS SURGERY Left 1/ 2016   Dr Del Favia-- Basal cell   PROSTATE SURGERY     REMOVAL OF STONES  02/22/2022   Procedure: REMOVAL OF STONES;  Surgeon: Alvis Jourdain, MD;  Location: [facility]COPY;  Service: Gastroenterology;;   [address]  02/22/2022   Procedure: [address];  Surgeon: Alvis Jourdain, MD;  Location: [facility] ENDOSCOPY;  Service: Gastroenterology;;   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 N/A 06/22/2021   Procedure: TRANSURETHRAL RESECTION OF BLADDER TUMOR;  Surgeon: Trent Frizzle, MD;  Location: [facility];  Service: Urology;  Laterality: N/A;   TRANSURETHRAL RESECTION OF PROSTATE N/A 02/26/2013   Procedure: TRANSURETHRAL RESECTION OF THE PROSTATE WITH GYRUS INSTRUMENTS;  Surgeon: Trent Frizzle, MD;  Location: [facility];  Service: Urology;  Laterality: N/A;   TRANSURETHRAL RESECTION OF PROSTATE N/A 06/22/2021   Procedure: TRANSURETHRAL  RESECTION OF THE PROSTATE (TURP);  Surgeon: Trent Frizzle, MD;  Location: [facility];  Service: Urology;  Laterality: N/A;   Patient Active Problem List   Diagnosis Date Noted   Bronchitis 06/16/2023   Impacted cerumen of left ear 06/16/2023   Acute encephalopathy 04/18/2023   Parkinson's disease (HCC) 04/18/2023   History of CVA with residual deficit 04/18/2023   Chronic kidney disease, stage 3b (HCC) 04/18/2023   Dementia with behavioral disturbance (HCC) 04/18/2023   Anemia of chronic disease 04/18/2023   Laceration of right upper extremity 02/24/2023   Dermatitis 02/24/2023   Lung nodules 02/24/2023   Dysuria 08/03/2022   Nonintractable headache 07/01/2022   Bilateral impacted cerumen 06/24/2022   Rash 06/15/2022   Postoperative ileus (HCC) 02/27/2022   Ileus, postoperative (HCC) 02/26/2022   Choledocholithiasis 02/19/2022   DNR (do not resuscitate) 02/19/2022   Left elbow pain 01/26/2022   Mid back pain on left side 01/26/2022   Rib pain 01/26/2022   Acute pain of left shoulder 11/12/2021   Leukocytes in urine 11/12/2021   Urinary frequency 11/12/2021   Thrush 10/08/2021   Hemiplegia, 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   Insomnia    Prediabetes    Acute 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 (HCC)    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   Transaminitis 07/27/2021   UTI (urinary tract infection) 07/27/2021   CVA (cerebral vascular accident) (HCC) 07/27/2021   Fall 07/27/2021   Hyperglycemia 07/27/2021   Cholelithiasis 07/27/2021   Hypoxia 07/27/2021   Nausea and vomiting 07/27/2021   Acute metabolic encephalopathy 07/27/2021   Normocytic anemia 07/27/2021   Chronic back pain 07/27/2021   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   Closed fracture of first lumbar vertebra with routine healing 02/03/2021   Closed fracture of multiple ribs 11/18/2020   Anxiety  01/29/2020   Leg pain, bilateral 01/29/2020   Ingrown toenail 07/13/2019   Lumbar spondylosis 05/02/2018   Pain in left knee 03/09/2018   Osteoarthritis of left hip 01/16/2018   Trochanteric bursitis of left hip 01/16/2018   Preventative health care 09/26/2017   HTN (hypertension) 07/19/2015   Hyperlipidemia 07/19/2015   Great toe pain 02/11/2014   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   Obesity (BMI 30-39.9) 06/25/2013   Renal insufficiency 06/25/2013   Weakness of left arm 06/25/2013   Sebaceous cyst 03/03/2011   Sprain of lumbar region 07/31/2010   Rib pain, left 08/29/2009   Carotid artery stenosis, asymptomatic, bilateral 05/02/2009   Eczema, atopic 05/31/2008   Vitamin D  deficiency 03/01/2008   BPH (benign prostatic hyperplasia) 08/06/2007   Fasting hyperglycemia 12/21/2006   History of right MCA infarct 2006    ONSET DATE: 06/29/22 REFERRING DIAG:  Diagnosis  I63.9 (ICD-10-CM) - Cerebrovascular accident (CVA), unspecified mechanism (HCC)  W19.XXXD (ICD-10-CM) - Fall, subsequent encounter    THERAPY DIAG:  Muscle weakness (generalized)  Other abnormalities of gait and mobility  Hemiplegia and hemiparesis following cerebral infarction affecting left non-dominant side (HCC)  Repeated falls  Difficulty in walking, not elsewhere classified  Acute pain of left shoulder  History of falling  Rationale for Evaluation and Treatment: Rehabilitation  SUBJECTIVE:                                                                                                                                                                                         SUBJECTIVE STATEMENT:  No issues lately  PERTINENT HISTORY:  AADEN BUCKMAN is a 82 year old man with dementia, CKD, HTN, CAD, HLD and history of CVA  (2006, 2023), Parkinson's  PAIN:  Are you having pain? Faces/behavior scale- mild pain BLEs  and back   PRECAUTIONS: Fall  WEIGHT BEARING RESTRICTIONS:  No  FALLS: Has patient fallen in last 6 months? No  LIVING ENVIRONMENT: Lives with: lives with their family and lives with their spouse Lives in: House/apartment Stairs: 1 brick high step Has following equipment at home: Environmental consultant - 2 wheeled, Wheelchair (manual), Shower bench, bed side commode, Grab bars, and hospital bed, sliding pad for car  PLOF: Independent with basic ADLs and Independent with household mobility without device  PATIENT GOALS: Walk around the block with his wife. Be able to use the dining room chair rather than have to use the W/C. Increased I with bed mobility and simple tasks at home like make some coffee or brush his teeth, Step over tub to shower, has bench, but he can't really use it. Up and down the one step to get to back deck. 11/23/22: Patient's wife Amalia Badder updated his goals today: Walk around store (gets distracted), swing a golf club again (thinking of driving range), get up on his own, walk without leaning forward, more recognition of the left side (from CVA), not need gait belt, OT-- be able to get dressed more on his own, navigate the newspaper.  OBJECTIVE:  TREATMENT: 11/15/23 Nustep level 5 x 6 minutes UBE level 4 x 6 minutes In Pbars side stepping with tband on floor for step length help On airex reaching for numbers on wall 6" toe touches Standing ball toss Passive left shoulder ROM Red tband left shoulder ER/IR row and extension  11/10/23 UBE level 4 x 6 minutes Nustep level 5 x 6 minutes Partial sit ups  2.5# hip marches 2.5# LAQ Standing ball toss Standing ball kicks Side steps Gait with HHA outside out back door and around the the car  11/01/23 UBE level 4 x 6 minutes Nustep level 5 x 6 minutes Feet on ball K2C, rotation, small bridge, isometric abs Right hip extension blue tband  supine Supine right hip abduction with sliding board Blue tband clamshells Bed mobility on his own with CGA Gait out the back door and then along the asphalt to  his car at the front of the building with [facility]  10/27/23 Nustep level 5 x 6 minutes Bike level 5 x 5 minutes UBE level 4 x 5 minutes Seated partial sit ups Standing right arm 10# row/extension and then 5# left arm Standing with SPC 6" toe taps alternating Seated volley ball Seated 5# LAQ Seated elbow touches for core motions Passive left shoulder stretches   10/11/23 Nustep level 5 x 7 minutes Seated volley ball In pbars on airex reaching for numbers Partial sit ups Elbow touches for trunk and core Gait around the back parking [place] with HHA, needed one long seated rest break, needed a lot of cues, on the second half of [address] he was tired and really dragging his feet with significant forward trunk lean.   Passive motions of the left shoulder  10/06/23 Bike level 4 x 5 minutes Nustep level 5 x 5 minutes 3# LAQ 3# marches Seated volley ball Passive stretch left shoulder Partial sit ups On airex reaching for numbers on wall, trying to get him to use the left arm as well Gait outside with HHA half way around the parking [place] a short break and then walking in the grass from the front of the building to the car  09/27/23 Nustep level 5 x 6 minutes Bike level 4 x 6 minutes Seated volleyball Standing volleyball Partial sit ups TUG 27 seconds but needed ModA due to a LOB, the second attempt he did in 19 seconds but uncontrolled sitting Supine bridges 2# flexion and chest press with wate bar Supine ball reaching Supine to sit with min A gait with him out to the car   09/13/23 Nustep level 5 x 7 mintues Bike level 4 x 6 minutes Gait outside with HHA/CGA 250' x 2 Supine bridges Isometric abs Left leg abduction Supine to sitting with min A Seated volley ball Seated ball kicks  09/06/23 Home assessment with wife: Bathroom set up is very cramped and crowded.  Has over commode toilet and grab bars, they have a walker in front of the commode, the way it is set up he has to  walk side ways into the set up and side ways out, I lowered the front legs and put them in the shower that is directly in front and had the back legs out, this gave him about 10" more to get b/n the toilet and the walker so he could walk in and then turn instead of side ways. Bed transfer from supine:  We did this 4 times, dropped the side rail, raised head of the bed and wife typically uses a strap that she gives [place] and he pulls up with it.  Today he got up without it twice, we talked about protecting [name]/s back with leaning back and having a wide base of support, also to give [place] cues to lean forward as he tends to be retropulsive.   Shower:  Shelah Derry in and looked at this with Amalia Badder, gave some ideas as he is now holding grab bar and stepping over the tub to get in.  Asked about safer way of sitting and bringing legs over she said this was tried but they were unable to get his legs over. Went over HEP:  in his chair talked about reciprocal patterns and she looked on [place] for  a pedal machine Gait: outside with HHA to the mail box and back cues for step length  08/25/23 Nustep level 5 x 6 minutes, cues for larger movements Bike level 4 x 6 minutes UBE level 3 x 5 mintues Worked on some bed mobility Bridges Partial sit ups Supine left leg sliding board hip abduction Seated volley ball with some cues to use the left hand Gait outside with hand hold assist and some cues for steps, some walking in grass   PATIENT EDUCATION: Education details: none new 02/16/23 Person educated: Patient and Spouse Education method: Explanation, Demonstration, Tactile cues, and Verbal cues Education comprehension: verbalized understanding, returned demonstration, verbal cues required, tactile cues required, and needs further education   ------------------------------------------------------------------------------------------------------- (Measures in this section from initial evaluation unless otherwise  noted) DIAGNOSTIC FINDINGS:  MRI 2021 with degenerative changes in lumbar spine, L3-4 R subarticular stenosis COGNITION: Overall cognitive status: History of cognitive impairments - at baseline SENSATION: Not tested COORDINATION: Moderately impaired BLE MUSCLE LENGTH: Hamstrings: severely restricted B Thomas test: Severely restricted B. POSTURE: rounded shoulders, forward head, increased thoracic kyphosis, posterior pelvic tilt, and flexed trunk  LOWER EXTREMITY ROM:   BLE extremely stiff throughout, limited hip ROM in all planes, B knees limited in extension. LOWER EXTREMITY MMT:  3+/5 throughout. Unable to determine accuracy of MMT due to cognition. Functionally demosntrates poor coordinated activation and poor muscular endurance. BED MOBILITY: Min A for Supine<> Sit. Patient was using a ladder in his hospital bed and could move MI. TRANSFERS: Min A, mod VC, tends to lean back.  11/02/22 independent with use of hands on arm chair with a couple of tries CURB: Min A-Has one step out to his deck. STAIRS: N/A  GAIT: Gait pattern: step to pattern, decreased arm swing- Right, decreased arm swing- Left, decreased step length- Right, decreased step length- Left, shuffling, festinating, trunk flexed, narrow BOS, poor foot clearance- Right, and poor foot clearance- Left Distance walked: 66' Assistive device utilized: Environmental consultant - 2 wheeled Level of assistance: Occasional min A Comments: mod VC to stay close to walker, take longer steps. FUNCTIONAL TESTS:  5 times sit to stand: 38.23  09/29/22  could not performs 5XSTS, 11/02/22 = 47 seconds with cues to use arms, 07/21/23 really struggled today 55 seconds Timed up and go (TUG): 47.91 with RW, 07/21/22 TUG 29  seconds without device, 09/29/22 25 seconds, 11/02/22 TUG 20 seconds, 07/21/23 = 26 seconds no device SBA                                                                     PATIENT EDUCATION: Education details: Progress towards goals, POC Person  educated: Patient and Spouse Education method: Programmer, multimedia, Facilities manager, and Verbal cues  Education comprehension: verbalized understanding, returned demonstration, verbal cues required, and needs further education  HOME EXERCISE PROGRAM: PWR! moves  GOALS: Goals reviewed with patient? Yes  SHORT TERM GOALS: Target date: 12/22/22  Perform HEP for PWR! Moves with cues from family. Baseline: Pt's wife, Amalia Badder, notes walking and hitting beach ball as main HEP. Goal status: GOAL MET, 12/20/2022   2. Decrease 5 x STS to < 35 sec to demonstrate improved functional strength  Baseline: 47 seconds on 5/21 per note; 34.5 sec UE support 12/20/2022  Goal status: GOAL MET, 12/20/2022   3. Perform sit <> stand transfers from average chair height with Supervision.  Baseline: CGA  12/20/2022; (01/10/23) CGA  Goal status: NOT MET  LONG TERM GOALS: Target date: 04/22/23  Patient will perform HEP progression with caregiver assist. Baseline: has minimal HEP Goal status: progressing  2.  Decrease 5 x STS to < 25 sec to demonstrate improved functional strength Baseline: 47 seconds on 5/21 per note; (01/10/23) 36 sec w/ cues for speed/sequence>30.38 sec with BUE supprot Goal status: PARTIALLY MET, 01/24/2023  3. Improve quality of gait to be able to ambulate x 100 feet with supervision. Baseline: CGA with cues  Goal status: Met 08/18/23  4.  Patient will perform his bed mobility with MI including rolling, sit<>supine, and scooting in bed. Baseline: on hard surface mat he is independent but on soft bed at home he struggles, wife reports only needs a hand to get up now 11/02/22; (01/10/23) modified indep.  Goal status: MET  5.  Patient will demonstrate floor<>stand with CGA and verbal cues for patient's wife to be able to assist. Baseline:  Has to call 9-1-1; +2 max assist floor>stand with UE support 01/19/2023 Goal status: GOAL NOT MET 01/19/2023  **UPDATED GOALS BELOW ** per recert 05/17/2023  SHORT TERM GOALS:  Target date: 02/25/2023   Pt will perform HEP to maintain lower body strength, flexibility, posture with wife's assistance. Baseline:  Goal status: met 04/12/23  2. Pt will perform sit to stand, 8 of 10 reps with BUE support, with supervision, to maintain safety with transfers.  Baseline: min guard  Goal status: 02/07/23-CGA from elevated surface and max VC to lean forward, ongoing   LONG TERM GOALS: Target date: 07/18/23   Patient will perform HEP progression to maintain lower body strength, flexibility, posture with wife's assistance. Baseline:  Goal status: ongoing but progressing 08/25/23  2.  Pt will ambulate 150-175 ft in 2 MWT with min guard assist, to preserve functional mobility within the home.  Baseline: Goal status:  met 08/25/23  3. Wife will verbalize tips for fall prevention education to minimize fall frequency Baseline:  Goal status:progressing and doing well 4.  Patient will be able to toilet without freezing for over a 4 week period met 11/15/23 5.  Patient will be able to get out of bed from supine with wife assist minimal Assist,met 10/06/23   ASSESSMENT:  CLINICAL IMPRESSION:  Patient continued to do well today, did not want to ambulate today, some difficulty with the red tband with the left arm/hand, needing some assist at times.  I do feel he is taking bigger steps OBJECTIVE IMPAIRMENTS: Abnormal gait, decreased activity tolerance, decreased balance, decreased cognition, decreased coordination, decreased endurance, decreased mobility, difficulty walking, decreased ROM, decreased strength, decreased safety awareness, impaired flexibility, impaired UE functional use, improper body mechanics, and postural dysfunction.   PLAN:  PT FREQUENCY: 1x/week  PT DURATION: 12 weeks  PLANNED INTERVENTIONS: Therapeutic exercises, Therapeutic activity, Neuromuscular re-education, Balance training, Gait training, Patient/Family education, Self Care, Joint mobilization, Stair  training, Moist heat, and Manual therapy  PLAN FOR NEXT SESSION:   work on core and reciprocal patterns, gait and transfers Patient Details  Name: HALLIE ISHIDA MRN: [id number] Date of Birth: 21-Feb-1941 Referring Provider:  Crecencio Dodge, Candida Chalk, *  Cherylene Corrente, PT

## 2023-11-17 ENCOUNTER — Other Ambulatory Visit (HOSPITAL_BASED_OUTPATIENT_CLINIC_OR_DEPARTMENT_OTHER): Payer: Self-pay

## 2023-11-21 ENCOUNTER — Other Ambulatory Visit (HOSPITAL_BASED_OUTPATIENT_CLINIC_OR_DEPARTMENT_OTHER): Payer: Self-pay

## 2023-11-21 ENCOUNTER — Other Ambulatory Visit: Payer: Self-pay

## 2023-11-21 ENCOUNTER — Other Ambulatory Visit: Payer: Self-pay | Admitting: Family Medicine

## 2023-11-21 MED ORDER — PANTOPRAZOLE SODIUM 40 MG PO TBEC
40.0000 mg | DELAYED_RELEASE_TABLET | Freq: Every day | ORAL | 1 refills | Status: DC
  Filled 2023-11-21: qty 90, 90d supply, fill #0
  Filled 2024-02-23: qty 90, 90d supply, fill #1

## 2023-11-22 ENCOUNTER — Encounter: Payer: Self-pay | Admitting: Physical Therapy

## 2023-11-22 ENCOUNTER — Ambulatory Visit: Admitting: Physical Therapy

## 2023-11-22 DIAGNOSIS — R296 Repeated falls: Secondary | ICD-10-CM

## 2023-11-22 DIAGNOSIS — M6281 Muscle weakness (generalized): Secondary | ICD-10-CM | POA: Diagnosis not present

## 2023-11-22 DIAGNOSIS — I6523 Occlusion and stenosis of bilateral carotid arteries: Secondary | ICD-10-CM

## 2023-11-22 DIAGNOSIS — M25512 Pain in left shoulder: Secondary | ICD-10-CM

## 2023-11-22 DIAGNOSIS — R2689 Other abnormalities of gait and mobility: Secondary | ICD-10-CM

## 2023-11-22 DIAGNOSIS — I69354 Hemiplegia and hemiparesis following cerebral infarction affecting left non-dominant side: Secondary | ICD-10-CM

## 2023-11-22 DIAGNOSIS — R262 Difficulty in walking, not elsewhere classified: Secondary | ICD-10-CM

## 2023-11-22 NOTE — Therapy (Signed)
 OUTPATIENT PHYSICAL THERAPY NEURO TREATMENT  Patient Name: William [name] MRN: [id number] DOB:1941-04-17, 83 y.o., male Today's Date: 11/22/2023  PCP: Estill Hemming  DO REFERRING PROVIDER: same   END OF SESSION:  PT End of Session - 11/22/23 1539     Visit Number 81    Date for PT Re-Evaluation 11/27/23    Authorization Type HTA    PT Start Time 1530    PT Stop Time 0613    PT Time Calculation (min) 883 min    Equipment Utilized During Treatment Gait belt    Activity Tolerance Patient tolerated treatment well    Behavior During Therapy [facility] for tasks assessed/performed                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   HTN (hypertension) 07/19/2015   Hyperlipidemia    Major neurocognitive disorder 01/09/2014   Mild, related to stroke history   Nocturia    Renal insufficiency 06/25/2013   S/P carotid endarterectomy    BILATERAL ICA--  PATENT PER DUPLEX  05-19-2012   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/26/2017   Right calf   Urinary frequency    Vitamin D  deficiency    Past Surgical History:  Procedure Laterality Date   APPENDECTOMY  AS CHILD   CARDIOVASCULAR STRESS TEST  03-27-2012  DR CRENSHAW   LOW RISK LEXISCAN  STUDY-- PROBABLE NORMAL PERFUSION AND SOFT TISSUE ATTENUATION/  NO ISCHEMIA/ EF 51%   CAROTID ENDARTERECTOMY Bilateral LEFT  11-12-2008  DR GREG HAYES   RIGHT ICA  2006  ([facility])   CHOLECYSTECTOMY N/A 02/23/2022   Procedure: LAPAROSCOPIC CHOLECYSTECTOMY;  Surgeon: Junie Olds, MD;  Location: [facility] OR;  Service:  General;  Laterality: N/A;   CYSTOSCOPY W/ RETROGRADES Bilateral 06/22/2021   Procedure: CYSTOSCOPY WITH RETROGRADE PYELOGRAM;  Surgeon: Trent Frizzle, MD;  Location: [facility];  Service: Urology;  Laterality: Bilateral;   CYSTOSCOPY WITH LITHOLAPAXY N/A 02/26/2013   Procedure: CYSTOSCOPY WITH LITHOLAPAXY;  Surgeon: Trent Frizzle, MD;  Location: [facility];  Service: Urology;  Laterality: N/A;   ENDOSCOPIC RETROGRADE CHOLANGIOPANCREATOGRAPHY (ERCP) WITH PROPOFOL  N/A 02/22/2022   Procedure: ENDOSCOPIC RETROGRADE CHOLANGIOPANCREATOGRAPHY (ERCP) WITH PROPOFOL ;  Surgeon: Alvis Jourdain, MD;  Location: [facility] ENDOSCOPY;  Service: Gastroenterology;  Laterality: N/A;   EYE SURGERY  Jan. 2016   cataract surgery both eyes   INGUINAL HERNIA REPAIR Right 11-08-2006   IR KYPHO EA ADDL LEVEL THORACIC OR LUMBAR  02/12/2021   IR RADIOLOGIST EVAL & MGMT  02/18/2021   MASS EXCISION N/A 03/03/2016   Procedure: EXCISION OF BACK  MASS;  Surgeon: Lockie Rima, MD;  Location: [facility];  Service: General;  Laterality: N/A;   MOHS SURGERY Left 1/ 2016   Dr Del Favia-- Basal cell   PROSTATE SURGERY     REMOVAL OF STONES  02/22/2022   Procedure: REMOVAL OF STONES;  Surgeon: Alvis Jourdain, MD;  Location: [facility] ENDOSCOPY;  Service: Gastroenterology;;   [address]  02/22/2022  Procedure: SPHINCTEROTOMY;  Surgeon: Alvis Jourdain, MD;  Location: [facility] ENDOSCOPY;  Service: Gastroenterology;;   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 N/A 06/22/2021   Procedure: TRANSURETHRAL RESECTION OF BLADDER TUMOR;  Surgeon: Trent Frizzle, MD;  Location: [facility];  Service: Urology;  Laterality: N/A;   TRANSURETHRAL RESECTION OF PROSTATE N/A 02/26/2013   Procedure: TRANSURETHRAL RESECTION OF THE PROSTATE WITH GYRUS INSTRUMENTS;  Surgeon: Trent Frizzle, MD;  Location: [facility];  Service: Urology;  Laterality: N/A;   TRANSURETHRAL RESECTION OF  PROSTATE N/A 06/22/2021   Procedure: TRANSURETHRAL RESECTION OF THE PROSTATE (TURP);  Surgeon: Trent Frizzle, MD;  Location: [facility];  Service: Urology;  Laterality: N/A;   Patient Active Problem List   Diagnosis Date Noted   Bronchitis 06/16/2023   Impacted cerumen of left ear 06/16/2023   Acute encephalopathy 04/18/2023   Parkinson's disease (HCC) 04/18/2023   History of CVA with residual deficit 04/18/2023   Chronic kidney disease, stage 3b (HCC) 04/18/2023   Dementia with behavioral disturbance (HCC) 04/18/2023   Anemia of chronic disease 04/18/2023   Laceration of right upper extremity 02/24/2023   Dermatitis 02/24/2023   Lung nodules 02/24/2023   Dysuria 08/03/2022   Nonintractable headache 07/01/2022   Bilateral impacted cerumen 06/24/2022   Rash 06/15/2022   Postoperative ileus (HCC) 02/27/2022   Ileus, postoperative (HCC) 02/26/2022   Choledocholithiasis 02/19/2022   DNR (do not resuscitate) 02/19/2022   Left elbow pain 01/26/2022   Mid back pain on left side 01/26/2022   Rib pain 01/26/2022   Acute pain of left shoulder 11/12/2021   Leukocytes in urine 11/12/2021   Urinary frequency 11/12/2021   Thrush 10/08/2021   Hemiplegia, 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   Insomnia    Prediabetes    Acute 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 (HCC)    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   Transaminitis 07/27/2021   UTI (urinary tract infection) 07/27/2021   CVA (cerebral vascular accident) (HCC) 07/27/2021   Fall 07/27/2021   Hyperglycemia 07/27/2021   Cholelithiasis 07/27/2021   Hypoxia 07/27/2021   Nausea and vomiting 07/27/2021   Acute metabolic encephalopathy 07/27/2021   Normocytic anemia 07/27/2021   Chronic back pain 07/27/2021   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   Closed fracture of first lumbar vertebra with routine healing 02/03/2021   Closed  fracture of multiple ribs 11/18/2020   Anxiety 01/29/2020   Leg pain, bilateral 01/29/2020   Ingrown toenail 07/13/2019   Lumbar spondylosis 05/02/2018   Pain in left knee 03/09/2018   Osteoarthritis of left hip 01/16/2018   Trochanteric bursitis of left hip 01/16/2018   Preventative health care 09/26/2017   HTN (hypertension) 07/19/2015   Hyperlipidemia 07/19/2015   Great toe pain 02/11/2014   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   Obesity (BMI 30-39.9) 06/25/2013   Renal insufficiency 06/25/2013   Weakness of left arm 06/25/2013   Sebaceous cyst 03/03/2011   Sprain of lumbar region 07/31/2010   Rib pain, left 08/29/2009   Carotid artery stenosis, asymptomatic, bilateral 05/02/2009   Eczema, atopic 05/31/2008   Vitamin D  deficiency 03/01/2008   BPH (benign prostatic hyperplasia) 08/06/2007   Fasting hyperglycemia 12/21/2006   History of right MCA infarct 2006    ONSET DATE: 06/29/22 REFERRING DIAG:  Diagnosis  I63.9 (ICD-10-CM) - Cerebrovascular accident (CVA), unspecified mechanism (HCC)  W19.XXXD (ICD-10-CM) - Fall, subsequent encounter    THERAPY DIAG:  Muscle weakness (  generalized)  Other abnormalities of gait and mobility  Hemiplegia and hemiparesis following cerebral infarction affecting left non-dominant side (HCC)  Repeated falls  Difficulty in walking, not elsewhere classified  Acute pain of left shoulder  Carotid artery stenosis, asymptomatic, bilateral  Rationale for Evaluation and Treatment: Rehabilitation  SUBJECTIVE:                                                                                                                                                                                         SUBJECTIVE STATEMENT:  Wife reports that he is doing okay, Darrow [place] reports that his left shoulder and hand are hurting  PERTINENT HISTORY:  GORDAN GRELL is a 83 year old man with dementia, CKD, HTN, CAD, HLD and history of CVA  (2006, 2023),  Parkinson's  PAIN:  Are you having pain? Faces/behavior scale- mild pain BLEs  and back   PRECAUTIONS: Fall  WEIGHT BEARING RESTRICTIONS: No  FALLS: Has patient fallen in last 6 months? No  LIVING ENVIRONMENT: Lives with: lives with their family and lives with their spouse Lives in: House/apartment Stairs: 1 brick high step Has following equipment at home: Environmental consultant - 2 wheeled, Wheelchair (manual), Shower bench, bed side commode, Grab bars, and hospital bed, sliding pad for car  PLOF: Independent with basic ADLs and Independent with household mobility without device  PATIENT GOALS: Walk around the block with his wife. Be able to use the dining room chair rather than have to use the W/C. Increased I with bed mobility and simple tasks at home like make some coffee or brush his teeth, Step over tub to shower, has bench, but he can't really use it. Up and down the one step to get to back deck. 11/23/22: Patient's wife Amalia Badder updated his goals today: Walk around store (gets distracted), swing a golf club again (thinking of driving range), get up on his own, walk without leaning forward, more recognition of the left side (from CVA), not need gait belt, OT-- be able to get dressed more on his own, navigate the newspaper.  OBJECTIVE:  TREATMENT: 11/22/23 Nustep level 5 x 6 minutes Partial sit ups UBE level 4 x 5 minutes On airex reaching for numbers called out Standing ball toss Gait outside with HHA, a lot of cues and really Min/Mod A due to poor steps today  11/15/23 Nustep level 5 x 6 minutes UBE level 4 x 6 minutes In Pbars side stepping with tband on floor for step length help On airex reaching for numbers on wall 6" toe touches Standing ball toss Passive left shoulder ROM Red tband left shoulder ER/IR row and extension  11/10/23  UBE level 4 x 6 minutes Nustep level 5 x 6 minutes Partial sit ups  2.5# hip marches 2.5# LAQ Standing ball toss Standing ball kicks Side steps Gait  with HHA outside out back door and around the the car  11/01/23 UBE level 4 x 6 minutes Nustep level 5 x 6 minutes Feet on ball K2C, rotation, small bridge, isometric abs Right hip extension blue tband supine Supine right hip abduction with sliding board Blue tband clamshells Bed mobility on his own with CGA Gait out the back door and then along the asphalt to his car at the front of the building with [facility]  10/27/23 Nustep level 5 x 6 minutes Bike level 5 x 5 minutes UBE level 4 x 5 minutes Seated partial sit ups Standing right arm 10# row/extension and then 5# left arm Standing with SPC 6" toe taps alternating Seated volley ball Seated 5# LAQ Seated elbow touches for core motions Passive left shoulder stretches   10/11/23 Nustep level 5 x 7 minutes Seated volley ball In pbars on airex reaching for numbers Partial sit ups Elbow touches for trunk and core Gait around the back parking [place] with HHA, needed one long seated rest break, needed a lot of cues, on the second half of [address] he was tired and really dragging his feet with significant forward trunk lean.   Passive motions of the left shoulder  10/06/23 Bike level 4 x 5 minutes Nustep level 5 x 5 minutes 3# LAQ 3# marches Seated volley ball Passive stretch left shoulder Partial sit ups On airex reaching for numbers on wall, trying to get him to use the left arm as well Gait outside with HHA half way around the parking [place] a short break and then walking in the grass from the front of the building to the car  09/27/23 Nustep level 5 x 6 minutes Bike level 4 x 6 minutes Seated volleyball Standing volleyball Partial sit ups TUG 27 seconds but needed ModA due to a LOB, the second attempt he did in 19 seconds but uncontrolled sitting Supine bridges 2# flexion and chest press with wate bar Supine ball reaching Supine to sit with min A gait with him out to the car   09/13/23 Nustep level 5 x 7 mintues Bike  level 4 x 6 minutes Gait outside with HHA/CGA 250' x 2 Supine bridges Isometric abs Left leg abduction Supine to sitting with min A Seated volley ball Seated ball kicks  09/06/23 Home assessment with wife: Bathroom set up is very cramped and crowded.  Has over commode toilet and grab bars, they have a walker in front of the commode, the way it is set up he has to walk side ways into the set up and side ways out, I lowered the front legs and put them in the shower that is directly in front and had the back legs out, this gave him about 10" more to get b/n the toilet and the walker so he could walk in and then turn instead of side ways. Bed transfer from supine:  We did this 4 times, dropped the side rail, raised head of the bed and wife typically uses a strap that she gives [place] and he pulls up with it.  Today he got up without it twice, we talked about protecting [name]/s back with leaning back and having a wide base of support, also to give [place] cues to lean forward as he tends to be retropulsive.   Shower:  Went in and looked at this with Amalia Badder, gave some ideas as he is now holding grab bar and stepping over the tub to get in.  Asked about safer way of sitting and bringing legs over she said this was tried but they were unable to get his legs over. Went over HEP:  in his chair talked about reciprocal patterns and she looked on [place] for a pedal machine Gait: outside with HHA to the mail box and back cues for step length  08/25/23 Nustep level 5 x 6 minutes, cues for larger movements Bike level 4 x 6 minutes UBE level 3 x 5 mintues Worked on some bed mobility Bridges Partial sit ups Supine left leg sliding board hip abduction Seated volley ball with some cues to use the left hand Gait outside with hand hold assist and some cues for steps, some walking in grass   PATIENT EDUCATION: Education details: none new 02/16/23 Person educated: Patient and Spouse Education method: Explanation,  Demonstration, Tactile cues, and Verbal cues Education comprehension: verbalized understanding, returned demonstration, verbal cues required, tactile cues required, and needs further education   ------------------------------------------------------------------------------------------------------- (Measures in this section from initial evaluation unless otherwise noted) DIAGNOSTIC FINDINGS:  MRI 2021 with degenerative changes in lumbar spine, L3-4 R subarticular stenosis COGNITION: Overall cognitive status: History of cognitive impairments - at baseline SENSATION: Not tested COORDINATION: Moderately impaired BLE MUSCLE LENGTH: Hamstrings: severely restricted B Thomas test: Severely restricted B. POSTURE: rounded shoulders, forward head, increased thoracic kyphosis, posterior pelvic tilt, and flexed trunk  LOWER EXTREMITY ROM:   BLE extremely stiff throughout, limited hip ROM in all planes, B knees limited in extension. LOWER EXTREMITY MMT:  3+/5 throughout. Unable to determine accuracy of MMT due to cognition. Functionally demosntrates poor coordinated activation and poor muscular endurance. BED MOBILITY: Min A for Supine<> Sit. Patient was using a ladder in his hospital bed and could move MI. TRANSFERS: Min A, mod VC, tends to lean back.  11/02/22 independent with use of hands on arm chair with a couple of tries CURB: Min A-Has one step out to his deck. STAIRS: N/A  GAIT: Gait pattern: step to pattern, decreased arm swing- Right, decreased arm swing- Left, decreased step length- Right, decreased step length- Left, shuffling, festinating, trunk flexed, narrow BOS, poor foot clearance- Right, and poor foot clearance- Left Distance walked: 75' Assistive device utilized: Environmental consultant - 2 wheeled Level of assistance: Occasional min A Comments: mod VC to stay close to walker, take longer steps. FUNCTIONAL TESTS:  5 times sit to stand: 38.23  09/29/22  could not performs 5XSTS, 11/02/22 = 47 seconds  with cues to use arms, 07/21/23 really struggled today 55 seconds Timed up and go (TUG): 47.91 with RW, 07/21/22 TUG 29  seconds without device, 09/29/22 25 seconds, 11/02/22 TUG 20 seconds, 07/21/23 = 26 seconds no device SBA                                                                     PATIENT EDUCATION: Education details: Progress towards goals, POC Person educated: Patient and Spouse Education method: Programmer, multimedia, Facilities manager, and Verbal cues  Education comprehension: verbalized understanding, returned demonstration, verbal cues required, and needs further education  HOME EXERCISE PROGRAM: PWR! moves  GOALS: Goals reviewed  with patient? Yes  SHORT TERM GOALS: Target date: 12/22/22  Perform HEP for PWR! Moves with cues from family. Baseline: Pt's wife, Amalia Badder, notes walking and hitting beach ball as main HEP. Goal status: GOAL MET, 12/20/2022   2. Decrease 5 x STS to < 35 sec to demonstrate improved functional strength  Baseline: 47 seconds on 5/21 per note; 34.5 sec UE support 12/20/2022  Goal status: GOAL MET, 12/20/2022   3. Perform sit <> stand transfers from average chair height with Supervision.  Baseline: CGA  12/20/2022; (01/10/23) CGA  Goal status: NOT MET  LONG TERM GOALS: Target date: 04/22/23  Patient will perform HEP progression with caregiver assist. Baseline: has minimal HEP Goal status: progressing  2.  Decrease 5 x STS to < 25 sec to demonstrate improved functional strength Baseline: 47 seconds on 5/21 per note; (01/10/23) 36 sec w/ cues for speed/sequence>30.38 sec with BUE supprot Goal status: PARTIALLY MET, 01/24/2023  3. Improve quality of gait to be able to ambulate x 100 feet with supervision. Baseline: CGA with cues  Goal status: Met 08/18/23  4.  Patient will perform his bed mobility with MI including rolling, sit<>supine, and scooting in bed. Baseline: on hard surface mat he is independent but on soft bed at home he struggles, wife reports only needs a hand  to get up now 11/02/22; (01/10/23) modified indep.  Goal status: MET  5.  Patient will demonstrate floor<>stand with CGA and verbal cues for patient's wife to be able to assist. Baseline:  Has to call 9-1-1; +2 max assist floor>stand with UE support 01/19/2023 Goal status: GOAL NOT MET 01/19/2023  **UPDATED GOALS BELOW ** per recert 05/17/2023  SHORT TERM GOALS: Target date: 02/25/2023   Pt will perform HEP to maintain lower body strength, flexibility, posture with wife's assistance. Baseline:  Goal status: met 04/12/23  2. Pt will perform sit to stand, 8 of 10 reps with BUE support, with supervision, to maintain safety with transfers.  Baseline: min guard  Goal status: 02/07/23-CGA from elevated surface and max VC to lean forward, ongoing   LONG TERM GOALS: Target date: 07/18/23   Patient will perform HEP progression to maintain lower body strength, flexibility, posture with wife's assistance. Baseline:  Goal status: ongoing but progressing 08/25/23  2.  Pt will ambulate 150-175 ft in 2 MWT with min guard assist, to preserve functional mobility within the home.  Baseline: Goal status:  met 08/25/23  3. Wife will verbalize tips for fall prevention education to minimize fall frequency Baseline:  Goal status:progressing and doing well 4.  Patient will be able to toilet without freezing for over a 4 week period met 11/15/23 5.  Patient will be able to get out of bed from supine with wife assist minimal Assist,met 10/06/23   ASSESSMENT:  CLINICAL IMPRESSION:  Patient did not do well today, had c/o left arm and hand pain, he was taking much smaller steps and tended to be eaning forward, the walk today he really needed min/ModA due to small steps and him leaning forward, tried to correct with verbal and tactile cues but he really struggled today OBJECTIVE IMPAIRMENTS: Abnormal gait, decreased activity tolerance, decreased balance, decreased cognition, decreased coordination, decreased endurance,  decreased mobility, difficulty walking, decreased ROM, decreased strength, decreased safety awareness, impaired flexibility, impaired UE functional use, improper body mechanics, and postural dysfunction.   PLAN:  PT FREQUENCY: 1x/week  PT DURATION: 12 weeks  PLANNED INTERVENTIONS: Therapeutic exercises, Therapeutic activity, Neuromuscular re-education, Balance training, Gait training,  Patient/Family education, Self Care, Joint mobilization, Stair training, Moist heat, and Manual therapy  PLAN FOR NEXT SESSION:   work on core and reciprocal patterns, gait and transfers Patient Details  Name: ALISHA BURGO MRN: [id number] Date of Birth: 1940-10-19 Referring Provider:  Crecencio Dodge, Candida Chalk, *  Cherylene Corrente, PT

## 2023-12-02 ENCOUNTER — Other Ambulatory Visit: Payer: Self-pay | Admitting: Family Medicine

## 2023-12-02 ENCOUNTER — Other Ambulatory Visit: Payer: Self-pay

## 2023-12-02 ENCOUNTER — Other Ambulatory Visit (HOSPITAL_BASED_OUTPATIENT_CLINIC_OR_DEPARTMENT_OTHER): Payer: Self-pay

## 2023-12-02 MED ORDER — CLOPIDOGREL BISULFATE 75 MG PO TABS
75.0000 mg | ORAL_TABLET | Freq: Every day | ORAL | 0 refills | Status: DC
  Filled 2023-12-02 (×2): qty 90, 90d supply, fill #0

## 2023-12-02 MED ORDER — HYDRALAZINE HCL 25 MG PO TABS
25.0000 mg | ORAL_TABLET | Freq: Three times a day (TID) | ORAL | 0 refills | Status: DC
  Filled 2023-12-02: qty 270, 90d supply, fill #0

## 2023-12-05 ENCOUNTER — Other Ambulatory Visit: Payer: Self-pay

## 2023-12-12 ENCOUNTER — Other Ambulatory Visit: Payer: Self-pay

## 2023-12-13 ENCOUNTER — Ambulatory Visit: Attending: Family Medicine | Admitting: Physical Therapy

## 2023-12-13 ENCOUNTER — Encounter: Payer: Self-pay | Admitting: Physical Therapy

## 2023-12-13 DIAGNOSIS — M25512 Pain in left shoulder: Secondary | ICD-10-CM | POA: Insufficient documentation

## 2023-12-13 DIAGNOSIS — I69354 Hemiplegia and hemiparesis following cerebral infarction affecting left non-dominant side: Secondary | ICD-10-CM | POA: Diagnosis not present

## 2023-12-13 DIAGNOSIS — R262 Difficulty in walking, not elsewhere classified: Secondary | ICD-10-CM | POA: Diagnosis not present

## 2023-12-13 DIAGNOSIS — R296 Repeated falls: Secondary | ICD-10-CM | POA: Diagnosis not present

## 2023-12-13 DIAGNOSIS — R2689 Other abnormalities of gait and mobility: Secondary | ICD-10-CM | POA: Diagnosis not present

## 2023-12-13 DIAGNOSIS — R278 Other lack of coordination: Secondary | ICD-10-CM | POA: Insufficient documentation

## 2023-12-13 DIAGNOSIS — M6281 Muscle weakness (generalized): Secondary | ICD-10-CM | POA: Insufficient documentation

## 2023-12-13 DIAGNOSIS — R41840 Attention and concentration deficit: Secondary | ICD-10-CM | POA: Diagnosis not present

## 2023-12-13 NOTE — Therapy (Signed)
 OUTPATIENT PHYSICAL THERAPY NEURO TREATMENT  Patient Name: William [name] MRN: [id number] DOB:Feb 06, 1941, 83 y.o., male Today's Date: 12/13/2023  PCP: [name]  DO REFERRING PROVIDER: same   END OF SESSION:  PT End of Session - 12/13/23 1619     Visit Number 82    Date for PT Re-Evaluation 01/13/24    Authorization Type HTA    PT Start Time 1613    PT Stop Time 1700    PT Time Calculation (min) 47 min    Equipment Utilized During Treatment Gait belt    Activity Tolerance Patient tolerated treatment well    Behavior During Therapy WFL for tasks assessed/performed             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   HTN (hypertension) 07/19/2015   Hyperlipidemia    Major neurocognitive disorder 01/09/2014   Mild, related to stroke history   Nocturia    Renal insufficiency 06/25/2013   S/P carotid endarterectomy    BILATERAL ICA--  PATENT PER DUPLEX  05-19-2012   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 09/26/2017   Right calf   Urinary frequency    Vitamin D  deficiency    Past Surgical History:  Procedure Laterality Date   APPENDECTOMY  AS CHILD   CARDIOVASCULAR STRESS TEST  03-27-2012  DR CRENSHAW   LOW RISK LEXISCAN  STUDY-- PROBABLE NORMAL PERFUSION AND SOFT TISSUE ATTENUATION/  NO ISCHEMIA/ EF 51%   CAROTID ENDARTERECTOMY Bilateral LEFT  11-12-2008  DR GREG HAYES   RIGHT ICA  2006  ([facility])   CHOLECYSTECTOMY N/A 02/23/2022   Procedure: LAPAROSCOPIC CHOLECYSTECTOMY;  Surgeon: [name], MD;  Location: [facility] OR;  Service: General;   Laterality: N/A;   CYSTOSCOPY W/ RETROGRADES Bilateral 06/22/2021   Procedure: CYSTOSCOPY WITH RETROGRADE PYELOGRAM;  Surgeon: [name], MD;  Location: [facility];  Service: Urology;  Laterality: Bilateral;   CYSTOSCOPY WITH LITHOLAPAXY N/A 02/26/2013   Procedure: CYSTOSCOPY WITH LITHOLAPAXY;  Surgeon: Senior Matilda, MD;  Location: [facility];  Service: Urology;  Laterality: N/A;   ENDOSCOPIC RETROGRADE CHOLANGIOPANCREATOGRAPHY (ERCP) WITH PROPOFOL  N/A 02/22/2022   Procedure: ENDOSCOPIC RETROGRADE CHOLANGIOPANCREATOGRAPHY (ERCP) WITH PROPOFOL ;  Surgeon: [name], MD;  Location: [facility] ENDOSCOPY;  Service: Gastroenterology;  Laterality: N/A;   EYE SURGERY  Jan. 2016   cataract surgery both eyes   INGUINAL HERNIA REPAIR Right 11-08-2006   IR KYPHO EA ADDL LEVEL THORACIC OR LUMBAR  02/12/2021   IR RADIOLOGIST EVAL & MGMT  02/18/2021   MASS EXCISION N/A 03/03/2016   Procedure: EXCISION OF BACK  MASS;  Surgeon: [name], MD;  Location: [facility];  Service: General;  Laterality: N/A;   MOHS SURGERY Left 1/ 2016   [name]-- Basal cell   PROSTATE SURGERY     REMOVAL OF STONES  02/22/2022   Procedure: REMOVAL OF STONES;  Surgeon: [name], MD;  Location: [facility] ENDOSCOPY;  Service: Gastroenterology;;   [name]  02/22/2022   Procedure: SPHINCTEROTOMY;  Surgeon: [name], MD;  Location: [facility] ENDOSCOPY;  Service: Gastroenterology;;   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 N/A 06/22/2021   Procedure: TRANSURETHRAL RESECTION OF BLADDER TUMOR;  Surgeon: [name], MD;  Location: [facility];  Service: Urology;  Laterality: N/A;   TRANSURETHRAL RESECTION OF PROSTATE N/A 02/26/2013   Procedure: TRANSURETHRAL RESECTION OF THE PROSTATE WITH GYRUS INSTRUMENTS;  Surgeon: Senior Matilda, MD;  Location: [facility];  Service: Urology;  Laterality: N/A;   TRANSURETHRAL RESECTION OF PROSTATE  N/A 06/22/2021   Procedure: TRANSURETHRAL RESECTION OF THE PROSTATE (TURP);  Surgeon: [name], MD;  Location: [facility];  Service: Urology;  Laterality: N/A;   Patient Active Problem List   Diagnosis Date Noted   Bronchitis 06/16/2023   Impacted cerumen of left ear 06/16/2023   Acute encephalopathy 04/18/2023   Parkinson's disease (HCC) 04/18/2023   History of CVA with residual deficit 04/18/2023   Chronic kidney disease, stage 3b (HCC) 04/18/2023   Dementia with behavioral disturbance (HCC) 04/18/2023   Anemia of chronic disease 04/18/2023   Laceration of right upper extremity 02/24/2023   Dermatitis 02/24/2023   Lung nodules 02/24/2023   Dysuria 08/03/2022   Nonintractable headache 07/01/2022   Bilateral impacted cerumen 06/24/2022   Rash 06/15/2022   Postoperative ileus (HCC) 02/27/2022   Ileus, postoperative (HCC) 02/26/2022   Choledocholithiasis 02/19/2022   DNR (do not resuscitate) 02/19/2022   Left elbow pain 01/26/2022   Mid back pain on left side 01/26/2022   Rib pain 01/26/2022   Acute pain of left shoulder 11/12/2021   Leukocytes in urine 11/12/2021   Urinary frequency 11/12/2021   Thrush 10/08/2021   Hemiplegia, 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   Insomnia    Prediabetes    Acute 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 (HCC)    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   Transaminitis 07/27/2021   UTI (urinary tract infection) 07/27/2021   CVA (cerebral vascular accident) (HCC) 07/27/2021   Fall 07/27/2021   Hyperglycemia 07/27/2021   Cholelithiasis 07/27/2021   Hypoxia 07/27/2021   Nausea and vomiting 07/27/2021   Acute metabolic encephalopathy 07/27/2021   Normocytic anemia 07/27/2021   Chronic back pain 07/27/2021   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   Closed fracture of first lumbar vertebra with routine healing 02/03/2021   Closed fracture  of multiple ribs 11/18/2020   Anxiety 01/29/2020   Leg pain, bilateral 01/29/2020   Ingrown toenail 07/13/2019   Lumbar spondylosis 05/02/2018   Pain in left knee 03/09/2018   Osteoarthritis of left hip 01/16/2018   Trochanteric bursitis of left hip 01/16/2018   Preventative health care 09/26/2017   HTN (hypertension) 07/19/2015   Hyperlipidemia 07/19/2015   Great toe pain 02/11/2014   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   Obesity (BMI 30-39.9) 06/25/2013   Renal insufficiency 06/25/2013   Weakness of left arm 06/25/2013   Sebaceous cyst 03/03/2011   Sprain of lumbar region 07/31/2010   Rib pain, left 08/29/2009   Carotid artery stenosis, asymptomatic, bilateral 05/02/2009   Eczema, atopic 05/31/2008   Vitamin D  deficiency 03/01/2008   BPH (benign prostatic hyperplasia) 08/06/2007   Fasting hyperglycemia 12/21/2006   History of right MCA infarct 2006    ONSET DATE: 06/29/22 REFERRING DIAG:  Diagnosis  I63.9 (ICD-10-CM) - Cerebrovascular accident (CVA), unspecified mechanism (HCC)  W19.XXXD (ICD-10-CM) - Fall, subsequent encounter    THERAPY DIAG:  Muscle weakness (generalized)  Other  abnormalities of gait and mobility  Hemiplegia and hemiparesis following cerebral infarction affecting left non-dominant side (HCC)  Repeated falls  Difficulty in walking, not elsewhere classified  Acute pain of left shoulder  Rationale for Evaluation and Treatment: Rehabilitation  SUBJECTIVE:                                                                                                                                                                                         SUBJECTIVE STATEMENT:  Wife reports that Octaviano had a fall about 2 weeks ago, she reports that after the bus ride he is agitated, she reports that he came home and was in the chair and she stepped away into the kitchen and when she came back he was up walking on his own and fell, reports no  injuries  PERTINENT HISTORY:  NIKOLAS CASHER is a 83 year old man with dementia, CKD, HTN, CAD, HLD and history of CVA  (2006, 2023), Parkinson's  PAIN:  Are you having pain? Faces/behavior scale- mild pain BLEs  and back   PRECAUTIONS: Fall  WEIGHT BEARING RESTRICTIONS: No  FALLS: Has patient fallen in last 6 months? No  LIVING ENVIRONMENT: Lives with: lives with their family and lives with their spouse Lives in: House/apartment Stairs: 1 brick high step Has following equipment at home: Environmental consultant - 2 wheeled, Wheelchair (manual), Shower bench, bed side commode, Grab bars, and hospital bed, sliding pad for car  PLOF: Independent with basic ADLs and Independent with household mobility without device  PATIENT GOALS: Walk around the block with his wife. Be able to use the dining room chair rather than have to use the W/C. Increased I with bed mobility and simple tasks at home like make some coffee or brush his teeth, Step over tub to shower, has bench, but he can't really use it. Up and down the one step to get to back deck. 11/23/22: Patient's wife Devere updated his goals today: Walk around store (gets distracted), swing a golf club again (thinking of driving range), get up on his own, walk without leaning forward, more recognition of the left side (from CVA), not need gait belt, OT-- be able to get dressed more on his own, navigate the newspaper.  OBJECTIVE:  TREATMENT: 12/13/23 Nustep level 5 x 5 minutes UBE level 4 x 6 minutes Walking with hand hold assist a lot of cues for posture and step length we did 200 feet x 3 Sit to supine to sit multiple times working on him trying to do all on his own, did need verbal cues and to get all the way up the second time needed mod  A Partial sit ups in sitting Ball bouncing  Walking direction changes  11/22/23 Nustep level 5 x 6 minutes Partial sit ups UBE level 4 x 5 minutes On airex reaching for numbers called out Standing ball toss Gait  outside with HHA, a lot of cues and really Min/Mod A due to poor steps today  11/15/23 Nustep level 5 x 6 minutes UBE level 4 x 6 minutes In Pbars side stepping with tband on floor for step length help On airex reaching for numbers on wall 6 toe touches Standing ball toss Passive left shoulder ROM Red tband left shoulder ER/IR row and extension  11/10/23 UBE level 4 x 6 minutes Nustep level 5 x 6 minutes Partial sit ups  2.5# hip marches 2.5# LAQ Standing ball toss Standing ball kicks Side steps Gait with HHA outside out back door and around the the car  11/01/23 UBE level 4 x 6 minutes Nustep level 5 x 6 minutes Feet on ball K2C, rotation, small bridge, isometric abs Right hip extension blue tband supine Supine right hip abduction with sliding board Blue tband clamshells Bed mobility on his own with CGA Gait out the back door and then along the asphalt to his car at the front of the building with [facility]  10/27/23 Nustep level 5 x 6 minutes Bike level 5 x 5 minutes UBE level 4 x 5 minutes Seated partial sit ups Standing right arm 10# row/extension and then 5# left arm Standing with SPC 6 toe taps alternating Seated volley ball Seated 5# LAQ Seated elbow touches for core motions Passive left shoulder stretches   10/11/23 Nustep level 5 x 7 minutes Seated volley ball In pbars on airex reaching for numbers Partial sit ups Elbow touches for trunk and core Gait around the back parking [place] with HHA, needed one long seated rest break, needed a lot of cues, on the second half of [address] he was tired and really dragging his feet with significant forward trunk lean.   Passive motions of the left shoulder  10/06/23 Bike level 4 x 5 minutes Nustep level 5 x 5 minutes 3# LAQ 3# marches Seated volley ball Passive stretch left shoulder Partial sit ups On airex reaching for numbers on wall, trying to get him to use the left arm as well Gait outside with HHA half way  around the parking [place] a short break and then walking in the grass from the front of the building to the car  09/27/23 Nustep level 5 x 6 minutes Bike level 4 x 6 minutes Seated volleyball Standing volleyball Partial sit ups TUG 27 seconds but needed ModA due to a LOB, the second attempt he did in 19 seconds but uncontrolled sitting Supine bridges 2# flexion and chest press with wate bar Supine ball reaching Supine to sit with min A gait with him out to the car   09/13/23 Nustep level 5 x 7 mintues Bike level 4 x 6 minutes Gait outside with HHA/CGA 250' x 2 Supine bridges Isometric abs Left leg abduction Supine to sitting with min A Seated volley ball Seated ball kicks  09/06/23 Home assessment with wife: Bathroom set up is very cramped and crowded.  Has over commode toilet and grab bars, they have a walker in front of the commode, the way it is set up he has to walk side ways into the set up and side ways out, I lowered the front legs and put them in the shower that is directly in  front and had the back legs out, this gave him about 10 more to get b/n the toilet and the walker so he could walk in and then turn instead of side ways. Bed transfer from supine:  We did this 4 times, dropped the side rail, raised head of the bed and wife typically uses a strap that she gives Octaviano and he pulls up with it.  Today he got up without it twice, we talked about protecting [name]/s back with leaning back and having a wide base of support, also to give Octaviano cues to lean forward as he tends to be retropulsive.   Shower:  Santina in and looked at this with Devere, gave some ideas as he is now holding grab bar and stepping over the tub to get in.  Asked about safer way of sitting and bringing legs over she said this was tried but they were unable to get his legs over. Went over HEP:  in his chair talked about reciprocal patterns and she looked on [place] for a pedal machine Gait: outside with HHA to the mail  box and back cues for step length  08/25/23 Nustep level 5 x 6 minutes, cues for larger movements Bike level 4 x 6 minutes UBE level 3 x 5 mintues Worked on some bed mobility Bridges Partial sit ups Supine left leg sliding board hip abduction Seated volley ball with some cues to use the left hand Gait outside with hand hold assist and some cues for steps, some walking in grass   PATIENT EDUCATION: Education details: none new 02/16/23 Person educated: Patient and Spouse Education method: Explanation, Demonstration, Tactile cues, and Verbal cues Education comprehension: verbalized understanding, returned demonstration, verbal cues required, tactile cues required, and needs further education   ------------------------------------------------------------------------------------------------------- (Measures in this section from initial evaluation unless otherwise noted) DIAGNOSTIC FINDINGS:  MRI 2021 with degenerative changes in lumbar spine, L3-4 R subarticular stenosis COGNITION: Overall cognitive status: History of cognitive impairments - at baseline SENSATION: Not tested COORDINATION: Moderately impaired BLE MUSCLE LENGTH: Hamstrings: severely restricted B Thomas test: Severely restricted B. POSTURE: rounded shoulders, forward head, increased thoracic kyphosis, posterior pelvic tilt, and flexed trunk  LOWER EXTREMITY ROM:   BLE extremely stiff throughout, limited hip ROM in all planes, B knees limited in extension. LOWER EXTREMITY MMT:  3+/5 throughout. Unable to determine accuracy of MMT due to cognition. Functionally demosntrates poor coordinated activation and poor muscular endurance. BED MOBILITY: Min A for Supine<> Sit. Patient was using a ladder in his hospital bed and could move MI. TRANSFERS: Min A, mod VC, tends to lean back.  11/02/22 independent with use of hands on arm chair with a couple of tries CURB: Min A-Has one step out to his deck. STAIRS: N/A  GAIT: Gait  pattern: step to pattern, decreased arm swing- Right, decreased arm swing- Left, decreased step length- Right, decreased step length- Left, shuffling, festinating, trunk flexed, narrow BOS, poor foot clearance- Right, and poor foot clearance- Left Distance walked: 35' Assistive device utilized: Environmental consultant - 2 wheeled Level of assistance: Occasional min A Comments: mod VC to stay close to walker, take longer steps. FUNCTIONAL TESTS:  5 times sit to stand: 38.23  09/29/22  could not performs 5XSTS, 11/02/22 = 47 seconds with cues to use arms, 07/21/23 really struggled today 55 seconds Timed up and go (TUG): 47.91 with RW, 07/21/22 TUG 29  seconds without device, 09/29/22 25 seconds, 11/02/22 TUG 20 seconds, 07/21/23 = 26 seconds no device SBA  PATIENT EDUCATION: Education details: Progress towards goals, POC Person educated: Patient and Spouse Education method: Explanation, Demonstration, and Verbal cues  Education comprehension: verbalized understanding, returned demonstration, verbal cues required, and needs further education  HOME EXERCISE PROGRAM: PWR! moves  GOALS: Goals reviewed with patient? Yes  SHORT TERM GOALS: Target date: 12/22/22  Perform HEP for PWR! Moves with cues from family. Baseline: Pt's wife, Devere, notes walking and hitting beach ball as main HEP. Goal status: GOAL MET, 12/20/2022   2. Decrease 5 x STS to < 35 sec to demonstrate improved functional strength  Baseline: 47 seconds on 5/21 per note; 34.5 sec UE support 12/20/2022  Goal status: GOAL MET, 12/20/2022   3. Perform sit <> stand transfers from average chair height with Supervision.  Baseline: CGA  12/20/2022; (01/10/23) CGA  Goal status: NOT MET  LONG TERM GOALS: Target date: 04/22/23  Patient will perform HEP progression with caregiver assist. Baseline: has minimal HEP Goal status: progressing  2.  Decrease 5 x STS to < 25 sec to demonstrate improved  functional strength Baseline: 47 seconds on 5/21 per note; (01/10/23) 36 sec w/ cues for speed/sequence>30.38 sec with BUE supprot Goal status: PARTIALLY MET, 01/24/2023  3. Improve quality of gait to be able to ambulate x 100 feet with supervision. Baseline: CGA with cues  Goal status: Met 08/18/23  4.  Patient will perform his bed mobility with MI including rolling, sit<>supine, and scooting in bed. Baseline: on hard surface mat he is independent but on soft bed at home he struggles, wife reports only needs a hand to get up now 11/02/22; (01/10/23) modified indep.  Goal status: MET  5.  Patient will demonstrate floor<>stand with CGA and verbal cues for patient's wife to be able to assist. Baseline:  Has to call 9-1-1; +2 max assist floor>stand with UE support 01/19/2023 Goal status: GOAL NOT MET 01/19/2023  **UPDATED GOALS BELOW ** per recert 05/17/2023  SHORT TERM GOALS: Target date: 02/25/2023   Pt will perform HEP to maintain lower body strength, flexibility, posture with wife's assistance. Baseline:  Goal status: met 04/12/23  2. Pt will perform sit to stand, 8 of 10 reps with BUE support, with supervision, to maintain safety with transfers.  Baseline: min guard  Goal status: 02/07/23-CGA from elevated surface and max VC to lean forward, ongoing   LONG TERM GOALS: Target date: 07/18/23   Patient will perform HEP progression to maintain lower body strength, flexibility, posture with wife's assistance. Baseline:  Goal status: doing some walking with his wife when she feels safe, has done some pedaling at home as well 12/13/23  2.  Pt will ambulate 150-175 ft in 2 MWT with min guard assist, to preserve functional mobility within the home.  Baseline: Goal status:  met 08/25/23  3. Wife will verbalize tips for fall prevention education to minimize fall frequency Baseline:  Goal status:progressing and doing well 4.  Patient will be able to toilet without freezing for over a 4 week period met  11/15/23 5.  Patient will be able to get out of bed from supine with wife assist minimal Assist,met 10/06/23   ASSESSMENT:  CLINICAL IMPRESSION:  Patient has had some ups and downs and had a recent fall.  Wife reports that he has regressed some with the transfers out of bed, reports still doing well with the bathroom transfers.  She does report trying to get him to walk to the mail box but it has been very hot and has had  some rain.  He is less distracted than 6 months ago but still really shuffles, when he sees his wife while walking he does have some difficulty with concentration and will really shuffle and tend to get his body out in front of his feet requiring Min to Mod A to slow down and correct OBJECTIVE IMPAIRMENTS: Abnormal gait, decreased activity tolerance, decreased balance, decreased cognition, decreased coordination, decreased endurance, decreased mobility, difficulty walking, decreased ROM, decreased strength, decreased safety awareness, impaired flexibility, impaired UE functional use, improper body mechanics, and postural dysfunction.   PLAN:  PT FREQUENCY: 1x/week  PT DURATION: 12 weeks  PLANNED INTERVENTIONS: Therapeutic exercises, Therapeutic activity, Neuromuscular re-education, Balance training, Gait training, Patient/Family education, Self Care, Joint mobilization, Stair training, Moist heat, and Manual therapy  PLAN FOR NEXT SESSION:   Will continue to work wiht patient to help him and his wife stay living at home and with less difficulty Patient Details  Name: William [name] MRN: [id number] Date of Birth: 1941/05/01 Referring Provider:  [name], *  Ozell Mainland, PT

## 2023-12-15 ENCOUNTER — Ambulatory Visit: Admitting: Podiatry

## 2023-12-15 DIAGNOSIS — Z7901 Long term (current) use of anticoagulants: Secondary | ICD-10-CM | POA: Diagnosis not present

## 2023-12-15 DIAGNOSIS — M79676 Pain in unspecified toe(s): Secondary | ICD-10-CM

## 2023-12-15 NOTE — Progress Notes (Signed)
 Subjective: Chief Complaint  Patient presents with   RFC    RM#12 RFC    83 y.o. returns the office today for painful, elongated, thickened toenails which he cannot trim himself.  As a prescription for thick toenails to become sensitive.  No swelling redness or drainage.   He has been complaining of his heels at times. No injuries. No open lesions. He cannot use voltaren  gel. They tried biofreeze but could not tolerate the odor.   He is on Plavix .  PCP: [name], DO Last seen 09/01/2023  Objective: NAD, presents with his wife DP/PT pulses palpable, CRT less than 3 seconds  Nails hypertrophic, dystrophic, elongated, brittle, discolored x 10.  Incurvation of the hallux toenails without any edema, erythema or signs of infection.  There is tenderness overlying the nails 1-5 bilaterally.  No swelling around the nails, redness or drainage or any signs of infection. Mild dry skin present on the lateral aspect of the heels bilaterally but no ulcerations.  Not able to appreciate any tenderness today. Hammertoes present No open lesions No pain with calf compression, swelling, warmth, erythema.  Assessment: Patient presents with symptomatic onychomycosis  Plan: -Treatment options including alternatives, risks, complications were discussed -Nails debrided x 10 without any complications or bleeding.   -Advised continued foot checks - Discussed various topicals he can use to the heels. No pain today, continue to monitor.   Return in about 3 months (around 03/16/2024).  Donnice [name] Fees DPM

## 2023-12-19 ENCOUNTER — Other Ambulatory Visit: Payer: Self-pay

## 2023-12-19 ENCOUNTER — Other Ambulatory Visit (HOSPITAL_BASED_OUTPATIENT_CLINIC_OR_DEPARTMENT_OTHER): Payer: Self-pay

## 2023-12-20 ENCOUNTER — Encounter: Payer: Self-pay | Admitting: Family Medicine

## 2023-12-20 ENCOUNTER — Other Ambulatory Visit (HOSPITAL_BASED_OUTPATIENT_CLINIC_OR_DEPARTMENT_OTHER): Payer: Self-pay

## 2023-12-20 ENCOUNTER — Ambulatory Visit (INDEPENDENT_AMBULATORY_CARE_PROVIDER_SITE_OTHER): Admitting: Family Medicine

## 2023-12-20 VITALS — BP 132/70 | HR 58 | Temp 97.8°F | Resp 16 | Ht 69.0 in | Wt 146.4 lb

## 2023-12-20 DIAGNOSIS — G20A1 Parkinson's disease without dyskinesia, without mention of fluctuations: Secondary | ICD-10-CM | POA: Diagnosis not present

## 2023-12-20 DIAGNOSIS — F0390 Unspecified dementia without behavioral disturbance: Secondary | ICD-10-CM | POA: Diagnosis not present

## 2023-12-20 DIAGNOSIS — N1832 Chronic kidney disease, stage 3b: Secondary | ICD-10-CM | POA: Diagnosis not present

## 2023-12-20 DIAGNOSIS — I10 Essential (primary) hypertension: Secondary | ICD-10-CM

## 2023-12-20 DIAGNOSIS — Z91030 Bee allergy status: Secondary | ICD-10-CM | POA: Diagnosis not present

## 2023-12-20 DIAGNOSIS — N401 Enlarged prostate with lower urinary tract symptoms: Secondary | ICD-10-CM | POA: Diagnosis not present

## 2023-12-20 DIAGNOSIS — R300 Dysuria: Secondary | ICD-10-CM

## 2023-12-20 DIAGNOSIS — E785 Hyperlipidemia, unspecified: Secondary | ICD-10-CM | POA: Diagnosis not present

## 2023-12-20 DIAGNOSIS — I63511 Cerebral infarction due to unspecified occlusion or stenosis of right middle cerebral artery: Secondary | ICD-10-CM

## 2023-12-20 MED ORDER — EPINEPHRINE 0.3 MG/0.3ML IJ SOAJ
0.3000 mg | INTRAMUSCULAR | 1 refills | Status: DC | PRN
  Filled 2023-12-20: qty 2, 2d supply, fill #0

## 2023-12-20 MED ORDER — CEPHALEXIN 500 MG PO CAPS
500.0000 mg | ORAL_CAPSULE | Freq: Two times a day (BID) | ORAL | 0 refills | Status: DC
  Filled 2023-12-20: qty 20, 10d supply, fill #0

## 2023-12-20 NOTE — Patient Instructions (Signed)
 DASH Eating Plan DASH stands for Dietary Approaches to Stop Hypertension. The DASH eating plan is a healthy eating plan that has been shown to: Lower high blood pressure (hypertension). Reduce your risk for type 2 diabetes, heart disease, and stroke. Help with weight loss. What are tips for following this plan? Reading food labels Check food labels for the amount of salt (sodium) per serving. Choose foods with less than 5 percent of the Daily Value (DV) of sodium. In general, foods with less than 300 milligrams (mg) of sodium per serving fit into this eating plan. To find whole grains, look for the word "whole" as the first word in the ingredient list. Shopping Buy products labeled as "low-sodium" or "no salt added." Buy fresh foods. Avoid canned foods and pre-made or frozen meals. Cooking Try not to add salt when you cook. Use salt-free seasonings or herbs instead of table salt or sea salt. Check with your health care provider or pharmacist before using salt substitutes. Do not fry foods. Cook foods in healthy ways, such as baking, boiling, grilling, roasting, or broiling. Cook using oils that are good for your heart. These include olive, canola, avocado, soybean, and sunflower oil. Meal planning  Eat a balanced diet. This should include: 4 or more servings of fruits and 4 or more servings of vegetables each day. Try to fill half of your plate with fruits and vegetables. 6-8 servings of whole grains each day. 6 or less servings of lean meat, poultry, or fish each day. 1 oz is 1 serving. A 3 oz (85 g) serving of meat is about the same size as the palm of your hand. One egg is 1 oz (28 g). 2-3 servings of low-fat dairy each day. One serving is 1 cup (237 mL). 1 serving of nuts, seeds, or beans 5 times each week. 2-3 servings of heart-healthy fats. Healthy fats called omega-3 fatty acids are found in foods such as walnuts, flaxseeds, fortified milks, and eggs. These fats are also found in  cold-water fish, such as sardines, salmon, and mackerel. Limit how much you eat of: Canned or prepackaged foods. Food that is high in trans fat, such as fried foods. Food that is high in saturated fat, such as fatty meat. Desserts and other sweets, sugary drinks, and other foods with added sugar. Full-fat dairy products. Do not salt foods before eating. Do not eat more than 4 egg yolks a week. Try to eat at least 2 vegetarian meals a week. Eat more home-cooked food and less restaurant, buffet, and fast food. Lifestyle When eating at a restaurant, ask if your food can be made with less salt or no salt. If you drink alcohol: Limit how much you have to: 0-1 drink a day if you are male. 0-2 drinks a day if you are male. Know how much alcohol is in your drink. In the [place] one drink is one 12 oz bottle of beer (355 mL), one 5 oz glass of wine (148 mL), or one 1 oz glass of hard liquor (44 mL). General information Avoid eating more than 2,300 mg of salt a day. If you have hypertension, you may need to reduce your sodium intake to 1,500 mg a day. Work with your provider to stay at a healthy body weight or lose weight. Ask what the best weight range is for you. On most days of the week, get at least 30 minutes of exercise that causes your heart to beat faster. This may include walking, swimming, or  biking. Work with your provider or dietitian to adjust your eating plan to meet your specific calorie needs. What foods should I eat? Fruits All fresh, dried, or frozen fruit. Canned fruits that are in their natural juice and do not have sugar added to them. Vegetables Fresh or frozen vegetables that are raw, steamed, roasted, or grilled. Low-sodium or reduced-sodium tomato and vegetable juice. Low-sodium or reduced-sodium tomato sauce and tomato paste. Low-sodium or reduced-sodium canned vegetables. Grains Whole-grain or whole-wheat bread. Whole-grain or whole-wheat pasta. Brown rice. Orpah Cobb. Bulgur. Whole-grain and low-sodium cereals. Pita bread. Low-fat, low-sodium crackers. Whole-wheat flour tortillas. Meats and other proteins Skinless chicken or [place]. Ground chicken or [place]. Pork with fat trimmed off. Fish and seafood. Egg whites. Dried beans, peas, or lentils. Unsalted nuts, nut butters, and seeds. Unsalted canned beans. Lean cuts of beef with fat trimmed off. Low-sodium, lean precooked or cured meat, such as sausages or meat loaves. Dairy Low-fat (1%) or fat-free (skim) milk. Reduced-fat, low-fat, or fat-free cheeses. Nonfat, low-sodium ricotta or cottage cheese. Low-fat or nonfat yogurt. Low-fat, low-sodium cheese. Fats and oils Soft margarine without trans fats. Vegetable oil. Reduced-fat, low-fat, or light mayonnaise and salad dressings (reduced-sodium). Canola, safflower, olive, avocado, soybean, and sunflower oils. Avocado. Seasonings and condiments Herbs. Spices. Seasoning mixes without salt. Other foods Unsalted popcorn and pretzels. Fat-free sweets. The items listed above may not be all the foods and drinks you can have. Talk to a dietitian to learn more. What foods should I avoid? Fruits Canned fruit in a light or heavy syrup. Fried fruit. Fruit in cream or butter sauce. Vegetables Creamed or fried vegetables. Vegetables in a cheese sauce. Regular canned vegetables that are not marked as low-sodium or reduced-sodium. Regular canned tomato sauce and paste that are not marked as low-sodium or reduced-sodium. Regular tomato and vegetable juices that are not marked as low-sodium or reduced-sodium. Rosita Fire. Olives. Grains Baked goods made with fat, such as croissants, muffins, or some breads. Dry pasta or rice meal packs. Meats and other proteins Fatty cuts of meat. Ribs. Fried meat. Tomasa Blase. Bologna, salami, and other precooked or cured meats, such as sausages or meat loaves, that are not lean and low in sodium. Fat from the back of a pig (fatback). Bratwurst.  Salted nuts and seeds. Canned beans with added salt. Canned or smoked fish. Whole eggs or egg yolks. Chicken or [place] with skin. Dairy Whole or 2% milk, cream, and half-and-half. Whole or full-fat cream cheese. Whole-fat or sweetened yogurt. Full-fat cheese. Nondairy creamers. Whipped toppings. Processed cheese and cheese spreads. Fats and oils Butter. Stick margarine. Lard. Shortening. Ghee. Bacon fat. Tropical oils, such as coconut, palm kernel, or palm oil. Seasonings and condiments Onion salt, garlic salt, seasoned salt, table salt, and sea salt. Worcestershire sauce. Tartar sauce. Barbecue sauce. Teriyaki sauce. Soy sauce, including reduced-sodium soy sauce. Steak sauce. Canned and packaged gravies. Fish sauce. Oyster sauce. Cocktail sauce. Store-bought horseradish. Ketchup. Mustard. Meat flavorings and tenderizers. Bouillon cubes. Hot sauces. Pre-made or packaged marinades. Pre-made or packaged taco seasonings. Relishes. Regular salad dressings. Other foods Salted popcorn and pretzels. The items listed above may not be all the foods and drinks you should avoid. Talk to a dietitian to learn more. Where to find more information National Heart, Lung, and Blood Institute (NHLBI): BuffaloDryCleaner.gl American Heart Association (AHA): heart.org Academy of Nutrition and Dietetics: eatright.org National Kidney Foundation (NKF): kidney.org This information is not intended to replace advice given to you by your health care provider. Make sure  you discuss any questions you have with your health care provider. Document Revised: 06/17/2022 Document Reviewed: 06/17/2022 Elsevier Patient Education  2024 [employer].

## 2023-12-20 NOTE — Progress Notes (Signed)
 Established Patient Office Visit  Subjective   Patient ID: William [name], male    DOB: March 10, 1941  Age: 83 y.o. MRN: [id number]  Chief Complaint  Patient presents with   Hypertension   Follow-up    HPI Discussed the use of AI scribe software for clinical note transcription with the patient, who gave verbal consent to proceed.  History of Present Illness William [name] is an 83 year old male with Parkinson's disease who presents with abdominal pain and urinary symptoms.  He has been experiencing abdominal pain since this morning, which has improved but still causes discomfort. He has not had a bowel movement for a few days, which may be contributing to his symptoms. He takes Miralax  every other day and occasionally uses Pericolase to aid bowel movements. His eating habits fluctuate with his bowel movements, and he has lost approximately 20 pounds since March, dropping from 164 pounds to 146 pounds.  He has been experiencing urinary symptoms, including pain during urination and a noticeable odor. These symptoms have been intermittent, with the pain subsiding at times. He has a history of urinary tract infections, and his caregiver recalls a previous prescription for antibiotics from a nurse practitioner. His caregiver suspects a bladder infection, as he has been difficult to manage and exhibits behavioral changes when symptomatic.  He is experiencing increased drooling and coughing. He is drooling more than before and has difficulty swallowing, leading to coughing episodes. He has not been taking any specific medication for these symptoms, and his caregiver tries to encourage him to drink and swallow during meals to manage the drooling.  He has a history of earwax buildup, which was previously addressed by an ENT specialist. His caregiver mentions that he has been asking for Debrox to manage earwax, although his ears are currently not impacted.   Patient Active Problem List    Diagnosis Date Noted   Bronchitis 06/16/2023   Impacted cerumen of left ear 06/16/2023   Acute encephalopathy 04/18/2023   Parkinson's disease (HCC) 04/18/2023   History of CVA with residual deficit 04/18/2023   Chronic kidney disease, stage 3b (HCC) 04/18/2023   Dementia with behavioral disturbance (HCC) 04/18/2023   Anemia of chronic disease 04/18/2023   Laceration of right upper extremity 02/24/2023   Dermatitis 02/24/2023   Lung nodules 02/24/2023   Dysuria 08/03/2022   Nonintractable headache 07/01/2022   Bilateral impacted cerumen 06/24/2022   Rash 06/15/2022   Postoperative ileus (HCC) 02/27/2022   Ileus, postoperative (HCC) 02/26/2022   Choledocholithiasis 02/19/2022   DNR (do not resuscitate) 02/19/2022   Left elbow pain 01/26/2022   Mid back pain on left side 01/26/2022   Rib pain 01/26/2022   Acute pain of left shoulder 11/12/2021   Leukocytes in urine 11/12/2021   Urinary frequency 11/12/2021   Thrush 10/08/2021   Hemiplegia, 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   Insomnia    Prediabetes    Acute 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 (HCC)    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   Transaminitis 07/27/2021   UTI (urinary tract infection) 07/27/2021   CVA (cerebral vascular accident) (HCC) 07/27/2021   Fall 07/27/2021   Hyperglycemia 07/27/2021   Cholelithiasis 07/27/2021   Hypoxia 07/27/2021   Nausea and vomiting 07/27/2021   Acute metabolic encephalopathy 07/27/2021   Normocytic anemia 07/27/2021   Chronic back pain 07/27/2021   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   Closed fracture of  first lumbar vertebra with routine healing 02/03/2021   Closed fracture of multiple ribs 11/18/2020   Anxiety 01/29/2020   Leg pain, bilateral 01/29/2020   Ingrown toenail 07/13/2019   Lumbar spondylosis 05/02/2018   Pain in left knee 03/09/2018   Osteoarthritis of left hip 01/16/2018    Trochanteric bursitis of left hip 01/16/2018   Preventative health care 09/26/2017   HTN (hypertension) 07/19/2015   Hyperlipidemia 07/19/2015   Great toe pain 02/11/2014   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   Obesity (BMI 30-39.9) 06/25/2013   Renal insufficiency 06/25/2013   Weakness of left arm 06/25/2013   Sebaceous cyst 03/03/2011   Sprain of lumbar region 07/31/2010   Rib pain, left 08/29/2009   Carotid artery stenosis, asymptomatic, bilateral 05/02/2009   Eczema, atopic 05/31/2008   Vitamin D  deficiency 03/01/2008   BPH (benign prostatic hyperplasia) 08/06/2007   Fasting hyperglycemia 12/21/2006   History of right MCA infarct 2006   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   HTN (hypertension) 07/19/2015   Hyperlipidemia    Major neurocognitive disorder 01/09/2014   Mild, related to stroke history   Nocturia    Renal insufficiency 06/25/2013   S/P carotid endarterectomy    BILATERAL ICA--  PATENT PER DUPLEX  05-19-2012   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/26/2017   Right calf   Urinary frequency    Vitamin D  deficiency    Past Surgical History:  Procedure Laterality Date   APPENDECTOMY  AS CHILD   CARDIOVASCULAR STRESS TEST  03-27-2012  DR CRENSHAW   LOW RISK LEXISCAN  STUDY-- PROBABLE NORMAL PERFUSION AND SOFT TISSUE ATTENUATION/  NO ISCHEMIA/ EF 51%   CAROTID ENDARTERECTOMY Bilateral LEFT  11-12-2008  DR GREG HAYES   RIGHT ICA  2006  ([facility])   CHOLECYSTECTOMY N/A 02/23/2022   Procedure: LAPAROSCOPIC CHOLECYSTECTOMY;  Surgeon:  Lyndel Deward PARAS, MD;  Location: [facility] OR;  Service: General;  Laterality: N/A;   CYSTOSCOPY W/ RETROGRADES Bilateral 06/22/2021   Procedure: CYSTOSCOPY WITH RETROGRADE PYELOGRAM;  Surgeon: [name], MD;  Location: [facility];  Service: Urology;  Laterality: Bilateral;   CYSTOSCOPY WITH LITHOLAPAXY N/A 02/26/2013   Procedure: CYSTOSCOPY WITH LITHOLAPAXY;  Surgeon: Senior Matilda, MD;  Location: [facility];  Service: Urology;  Laterality: N/A;   ENDOSCOPIC RETROGRADE CHOLANGIOPANCREATOGRAPHY (ERCP) WITH PROPOFOL  N/A 02/22/2022   Procedure: ENDOSCOPIC RETROGRADE CHOLANGIOPANCREATOGRAPHY (ERCP) WITH PROPOFOL ;  Surgeon: [name], MD;  Location: [facility] ENDOSCOPY;  Service: Gastroenterology;  Laterality: N/A;   EYE SURGERY  Jan. 2016   cataract surgery both eyes   INGUINAL HERNIA REPAIR Right 11-08-2006   IR KYPHO EA ADDL LEVEL THORACIC OR LUMBAR  02/12/2021   IR RADIOLOGIST EVAL & MGMT  02/18/2021   MASS EXCISION N/A 03/03/2016   Procedure: EXCISION OF BACK  MASS;  Surgeon: [name], MD;  Location: [facility];  Service: General;  Laterality: N/A;   MOHS SURGERY Left 1/ 2016   [name]-- Basal cell   PROSTATE SURGERY  REMOVAL OF STONES  02/22/2022   Procedure: REMOVAL OF STONES;  Surgeon: [name], MD;  Location: [facility] ENDOSCOPY;  Service: Gastroenterology;;   [name]  02/22/2022   Procedure: [name];  Surgeon: [name], MD;  Location: [facility] ENDOSCOPY;  Service: Gastroenterology;;   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 N/A 06/22/2021   Procedure: TRANSURETHRAL RESECTION OF BLADDER TUMOR;  Surgeon: [name], MD;  Location: [facility];  Service: Urology;  Laterality: N/A;   TRANSURETHRAL RESECTION OF PROSTATE N/A 02/26/2013   Procedure: TRANSURETHRAL RESECTION OF THE PROSTATE WITH GYRUS INSTRUMENTS;  Surgeon: Senior Matilda, MD;  Location: [facility];  Service:  Urology;  Laterality: N/A;   TRANSURETHRAL RESECTION OF PROSTATE N/A 06/22/2021   Procedure: TRANSURETHRAL RESECTION OF THE PROSTATE (TURP);  Surgeon: [name], MD;  Location: [facility];  Service: Urology;  Laterality: N/A;   Social History   Tobacco Use   Smoking status: Former    Current packs/day: 0.00    Average packs/day: 2.0 packs/day for 40.0 years (80.0 ttl pk-yrs)    Types: Cigarettes    Start date: 02/15/1965    Quit date: 02/15/2005    Years since quitting: 18.8   Smokeless tobacco: Never  Vaping Use   Vaping status: Never Used  Substance Use Topics   Alcohol use: Not Currently   Drug use: No   Social History   Socioeconomic History   Marital status: Married    Spouse name: Not on file   Number of children: 2   Years of education: 12   Highest education level: Some college, no degree  Occupational History    Employer: Retired  Tobacco Use   Smoking status: Former    Current packs/day: 0.00    Average packs/day: 2.0 packs/day for 40.0 years (80.0 ttl pk-yrs)    Types: Cigarettes    Start date: 02/15/1965    Quit date: 02/15/2005    Years since quitting: 18.8   Smokeless tobacco: Never  Vaping Use   Vaping status: Never Used  Substance and Sexual Activity   Alcohol use: Not Currently   Drug use: No   Sexual activity: Not Currently    Partners: Female  Other Topics Concern   Not on file  Social History Narrative   Exercise--  Walks with assistance -- using wheelchair more than the walker       LIves with wife , no stairs in home, caffeine - one cup coffee day, exercise - not much, Right handed, 12th grade, retired      One story home   Social Drivers of Health   Financial Resource Strain: Low Risk  (12/19/2023)   Overall Financial Resource Strain (CARDIA)    Difficulty of Paying Living Expenses: Not very hard  Food Insecurity: No Food Insecurity (12/19/2023)   Hunger Vital Sign    Worried About Running Out of Food in the Last Year:  Never true    Ran Out of Food in the Last Year: Never true  Transportation Needs: No Transportation Needs (12/19/2023)   PRAPARE - Administrator, Civil Service (Medical): No    Lack of Transportation (Non-Medical): No  Physical Activity: Inactive (12/19/2023)   Exercise Vital Sign    Days of Exercise per Week: 0 days    Minutes of Exercise per Session: Not on file  Stress: No Stress Concern Present (12/19/2023)   [employer] of Occupational Health - Occupational Stress Questionnaire    Feeling of Stress: Not  at all  Recent Concern: Stress - Stress Concern Present (09/20/2023)   [employer] of Occupational Health - Occupational Stress Questionnaire    Feeling of Stress : To some extent  Social Connections: Moderately Integrated (12/19/2023)   Social Connection and Isolation Panel    Frequency of Communication with Friends and Family: More than three times a week    Frequency of Social Gatherings with Friends and Family: Once a week    Attends Religious Services: Never    Database administrator or Organizations: Yes    Attends Engineer, structural: More than 4 times per year    Marital Status: Married  Catering manager Violence: Not At Risk (04/19/2023)   Humiliation, Afraid, Rape, and Kick questionnaire    Fear of Current or Ex-Partner: No    Emotionally Abused: No    Physically Abused: No    Sexually Abused: No   Family Status  Relation Name Status   Mother  Deceased at age 44   Father  Deceased at age 18   Sister  Alive   Sister  Alive   Sister  Deceased  No partnership data on file   Family History  Problem Relation Age of Onset   Heart disease Mother        CHF   Bipolar disorder Mother    Heart disease Father        CHF   Allergies  Allergen Reactions   Bee Venom Anaphylaxis   Strawberry Extract Hives   Latex Itching   Zetia  [Ezetimibe ] Other (See Comments)    Intolerance    Adhesive [Tape] Other (See Comments)    blisters   Statins  Other (See Comments)    myalgias      Review of Systems  Constitutional:  Negative for chills, fever and malaise/fatigue.  HENT:  Negative for congestion and hearing loss.   Eyes:  Negative for discharge.  Respiratory:  Negative for cough, sputum production and shortness of breath.   Cardiovascular:  Negative for chest pain, palpitations and leg swelling.  Gastrointestinal:  Positive for abdominal pain. Negative for blood in stool, constipation, diarrhea, heartburn, nausea and vomiting.  Genitourinary:  Positive for dysuria. Negative for frequency, hematuria and urgency.  Musculoskeletal:  Negative for back pain, falls and myalgias.  Skin:  Negative for rash.  Neurological:  Positive for weakness. Negative for dizziness, sensory change, loss of consciousness and headaches.  Endo/Heme/Allergies:  Negative for environmental allergies. Does not bruise/bleed easily.  Psychiatric/Behavioral:  Negative for depression and suicidal ideas. The patient is not nervous/anxious and does not have insomnia.       Objective:     BP 132/70 (BP Location: Right Arm, Patient Position: Sitting, Cuff Size: Normal)   Pulse (!) 58   Temp 97.8 F (36.6 C) (Oral)   Resp 16   Ht 5' 9 (1.753 m)   Wt 146 lb 6.4 oz (66.4 kg)   SpO2 97%   BMI 21.62 kg/m  BP Readings from Last 3 Encounters:  12/20/23 132/70  09/10/23 (!) 144/67  09/01/23 (!) 147/77   Wt Readings from Last 3 Encounters:  12/20/23 146 lb 6.4 oz (66.4 kg)  09/20/23 156 lb (70.8 kg)  09/10/23 164 lb 0.4 oz (74.4 kg)   SpO2 Readings from Last 3 Encounters:  12/20/23 97%  09/10/23 100%  09/01/23 98%      Physical Exam Vitals and nursing note reviewed.  Constitutional:      General: He is not in acute  distress.    Appearance: Normal appearance. He is well-developed.  HENT:     Head: Normocephalic and atraumatic.  Eyes:     General: No scleral icterus.       Right eye: No discharge.        Left eye: No discharge.   Cardiovascular:     Rate and Rhythm: Normal rate and regular rhythm.     Heart sounds: No murmur heard. Pulmonary:     Effort: Pulmonary effort is normal. No respiratory distress.     Breath sounds: Normal breath sounds.  Abdominal:     General: There is no distension.     Tenderness: There is abdominal tenderness in the suprapubic area.  Musculoskeletal:        General: Normal range of motion.     Cervical back: Normal range of motion and neck supple.     Right lower leg: No edema.     Left lower leg: No edema.  Skin:    General: Skin is warm and dry.  Neurological:     Mental Status: He is alert and oriented to person, place, and time.  Psychiatric:        Mood and Affect: Mood normal.        Behavior: Behavior normal.        Thought Content: Thought content normal.        Judgment: Judgment normal.      No results found for any visits on 12/20/23.  Last CBC Lab Results  Component Value Date   WBC 8.1 09/10/2023   HGB 10.1 (L) 09/10/2023   HCT 31.8 (L) 09/10/2023   MCV 104.6 (H) 09/10/2023   MCH 33.2 09/10/2023   RDW 13.2 09/10/2023   PLT 162 09/10/2023   Last metabolic panel Lab Results  Component Value Date   GLUCOSE 108 (H) 09/10/2023   NA 136 09/10/2023   K 4.3 09/10/2023   CL 105 09/10/2023   CO2 22 09/10/2023   BUN 28 (H) 09/10/2023   CREATININE 1.51 (H) 09/10/2023   GFRNONAA 46 (L) 09/10/2023   CALCIUM  8.1 (L) 09/10/2023   PHOS 3.4 03/03/2022   PROT 5.8 (L) 09/10/2023   ALBUMIN 2.9 (L) 09/10/2023   LABGLOB 3.2 04/25/2019   AGRATIO 1.3 04/25/2019   BILITOT 0.5 09/10/2023   ALKPHOS 107 09/10/2023   AST 12 (L) 09/10/2023   ALT <5 09/10/2023   ANIONGAP 9 09/10/2023   Last lipids Lab Results  Component Value Date   CHOL 199 06/21/2023   HDL 54.60 06/21/2023   LDLCALC 126 (H) 06/21/2023   TRIG 93.0 06/21/2023   CHOLHDL 4 06/21/2023   Last hemoglobin A1c Lab Results  Component Value Date   HGBA1C 4.9 04/18/2023   Last thyroid   functions Lab Results  Component Value Date   TSH 0.96 06/21/2023   Last vitamin D  Lab Results  Component Value Date   VD25OH 46.2 05/18/2022   Last vitamin B12 and Folate Lab Results  Component Value Date   VITAMINB12 203 04/19/2023   FOLATE 7.0 03/01/2022    The ASCVD Risk score (Arnett DK, et al., 2019) failed to calculate for the following reasons:   The 2019 ASCVD risk score is only valid for ages 48 to 69   Risk score cannot be calculated because patient has a medical history suggesting prior/existing ASCVD    Assessment & Plan:   Problem List Items Addressed This Visit       Unprioritized   BPH (benign prostatic hyperplasia)  Dysuria   Relevant Medications   cephALEXin  (KEFLEX ) 500 MG capsule   Chronic kidney disease, stage 3b (HCC) - Primary   Relevant Orders   CBC with Differential/Platelet   Comprehensive metabolic panel with GFR   Major vascular neurocognitive disorder   Relevant Orders   CBC with Differential/Platelet   Comprehensive metabolic panel with GFR   Lipid panel   TSH   Hyperlipidemia   Relevant Medications   EPINEPHrine  (EPIPEN  2-PAK) 0.3 mg/0.3 mL IJ SOAJ injection   Other Relevant Orders   Comprehensive metabolic panel with GFR   Lipid panel   HTN (hypertension)   Relevant Medications   EPINEPHrine  (EPIPEN  2-PAK) 0.3 mg/0.3 mL IJ SOAJ injection   Other Relevant Orders   CBC with Differential/Platelet   Comprehensive metabolic panel with GFR   Lipid panel   TSH   Parkinson's disease (HCC)   CVA (cerebral vascular accident) (HCC)   Relevant Medications   EPINEPHrine  (EPIPEN  2-PAK) 0.3 mg/0.3 mL IJ SOAJ injection   Other Relevant Orders   CBC with Differential/Platelet   Comprehensive metabolic panel with GFR   Lipid panel   Other Visit Diagnoses       Bee sting allergy       Relevant Medications   EPINEPHrine  (EPIPEN  2-PAK) 0.3 mg/0.3 mL IJ SOAJ injection     Assessment and Plan Assessment & Plan Urinary Tract Infection  (UTI)   He experiences lower abdominal pain, foul-smelling urine, and dysuria. Difficulty in obtaining a urine sample is noted. Prescribe Keflex  and provide a urine collection cup for home use. Order a urine culture if a sample is obtained.  Constipation   He reports abdominal pain and no bowel movements for several days. Currently takes Miralax  every other day and occasionally Pericolase, which has been effective. Concerns about laxative dependency are noted. Consider Dulcolax or Senokot as needed. Continue Miralax  every other day.  Weight Loss   He has lost approximately 20 pounds since March, now weighing 146 pounds. This may be related to decreased appetite and constipation. Emphasize maintaining nutritional intake. Monitor weight and nutritional intake, and encourage regular meals and bowel movements.  Parkinson's Disease   His Parkinson's disease is progressing with increased drooling and coughing due to dysphagia. He is under the care of neurologists Dr. Skeet and Dr. Veneda. Medication for drooling was discussed, noting potential side effects of xerostomia and dehydration. Consider medication for drooling if symptoms worsen. Encourage frequent swallowing and hydration during meals.  Bee Allergy   He requires an EpiPen  for his bee allergy. Ensure the EpiPen  prescription is filled.  Follow-up   He requires follow-up for blood work and potential urine culture results. The caregiver is instructed to contact the office if issues arise with obtaining a urine sample. Obtain blood work and follow up with urine culture results if a sample is obtained. Contact the office if unable to obtain a urine sample.    Return in about 6 months (around 06/21/2024), or if symptoms worsen or fail to improve.    Modell Fendrick R Lowne Chase, DO

## 2023-12-21 LAB — CBC WITH DIFFERENTIAL/PLATELET
Basophils Absolute: 0.1 K/uL (ref 0.0–0.1)
Basophils Relative: 1 % (ref 0.0–3.0)
Eosinophils Absolute: 0.2 K/uL (ref 0.0–0.7)
Eosinophils Relative: 2.5 % (ref 0.0–5.0)
HCT: 36.5 % — ABNORMAL LOW (ref 39.0–52.0)
Hemoglobin: 12.4 g/dL — ABNORMAL LOW (ref 13.0–17.0)
Lymphocytes Relative: 22.7 % (ref 12.0–46.0)
Lymphs Abs: 1.5 K/uL (ref 0.7–4.0)
MCHC: 33.9 g/dL (ref 30.0–36.0)
MCV: 96.3 fl (ref 78.0–100.0)
Monocytes Absolute: 0.5 K/uL (ref 0.1–1.0)
Monocytes Relative: 8.2 % (ref 3.0–12.0)
Neutro Abs: 4.3 K/uL (ref 1.4–7.7)
Neutrophils Relative %: 65.6 % (ref 43.0–77.0)
Platelets: 195 K/uL (ref 150.0–400.0)
RBC: 3.79 Mil/uL — ABNORMAL LOW (ref 4.22–5.81)
RDW: 14.2 % (ref 11.5–15.5)
WBC: 6.6 K/uL (ref 4.0–10.5)

## 2023-12-21 LAB — COMPREHENSIVE METABOLIC PANEL WITH GFR
ALT: 3 U/L (ref 0–53)
AST: 11 U/L (ref 0–37)
Albumin: 4.1 g/dL (ref 3.5–5.2)
Alkaline Phosphatase: 144 U/L — ABNORMAL HIGH (ref 39–117)
BUN: 29 mg/dL — ABNORMAL HIGH (ref 6–23)
CO2: 26 meq/L (ref 19–32)
Calcium: 8.7 mg/dL (ref 8.4–10.5)
Chloride: 106 meq/L (ref 96–112)
Creatinine, Ser: 1.75 mg/dL — ABNORMAL HIGH (ref 0.40–1.50)
GFR: 36.09 mL/min — ABNORMAL LOW (ref >60.00)
Glucose, Bld: 91 mg/dL (ref 70–99)
Potassium: 4.3 meq/L (ref 3.5–5.1)
Sodium: 142 meq/L (ref 135–145)
Total Bilirubin: 0.4 mg/dL (ref 0.2–1.2)
Total Protein: 7 g/dL (ref 6.0–8.3)

## 2023-12-21 LAB — LIPID PANEL
Cholesterol: 182 mg/dL (ref 0–200)
HDL: 57.4 mg/dL (ref >39.00)
LDL Cholesterol: 112 mg/dL — ABNORMAL HIGH (ref 0–99)
NonHDL: 125
Total CHOL/HDL Ratio: 3
Triglycerides: 65 mg/dL (ref 0.0–149.0)
VLDL: 13 mg/dL (ref 0.0–40.0)

## 2023-12-21 LAB — TSH: TSH: 0.94 u[IU]/mL (ref 0.35–5.50)

## 2023-12-22 ENCOUNTER — Ambulatory Visit: Payer: Self-pay | Admitting: Family Medicine

## 2023-12-22 ENCOUNTER — Ambulatory Visit: Payer: PPO | Admitting: Family Medicine

## 2023-12-27 ENCOUNTER — Ambulatory Visit: Admitting: Physical Therapy

## 2023-12-28 NOTE — Therapy (Signed)
 OUTPATIENT PHYSICAL THERAPY NEURO TREATMENT  Patient Name: William [name] MRN: [id number] DOB:08-06-1940, 83 y.o., male Today's Date: 12/29/2023  PCP: [name]  DO REFERRING PROVIDER: same   END OF SESSION:  PT End of Session - 12/29/23 1629     Visit Number [id number]    Date for PT Re-Evaluation 01/13/24    Authorization Type HTA    PT Start Time 1630    PT Stop Time 1715    PT Time Calculation (min) 45 min    Equipment Utilized During Treatment Gait belt    Activity Tolerance Patient tolerated treatment well    Behavior During Therapy WFL for tasks assessed/performed              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   HTN (hypertension) 07/19/2015   Hyperlipidemia    Major neurocognitive disorder 01/09/2014   Mild, related to stroke history   Nocturia    Renal insufficiency 06/25/2013   S/P carotid endarterectomy    BILATERAL ICA--  PATENT PER DUPLEX  05-19-2012   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/26/2017   Right calf   Urinary frequency    Vitamin D  deficiency    Past Surgical History:  Procedure Laterality Date   APPENDECTOMY  AS CHILD   CARDIOVASCULAR STRESS TEST  03-27-2012  DR CRENSHAW   LOW RISK LEXISCAN  STUDY-- PROBABLE NORMAL PERFUSION AND SOFT TISSUE ATTENUATION/  NO ISCHEMIA/ EF 51%   CAROTID ENDARTERECTOMY Bilateral LEFT  11-12-2008  DR GREG HAYES   RIGHT ICA  2006  ([facility])   CHOLECYSTECTOMY N/A 02/23/2022   Procedure: LAPAROSCOPIC CHOLECYSTECTOMY;  Surgeon: [name], MD;  Location: [facility];  Service: General;   Laterality: N/A;   CYSTOSCOPY W/ RETROGRADES Bilateral 06/22/2021   Procedure: CYSTOSCOPY WITH RETROGRADE PYELOGRAM;  Surgeon: [name], MD;  Location: [facility];  Service: Urology;  Laterality: Bilateral;   CYSTOSCOPY WITH LITHOLAPAXY N/A 02/26/2013   Procedure: CYSTOSCOPY WITH LITHOLAPAXY;  Surgeon: Senior Matilda, MD;  Location: [facility];  Service: Urology;  Laterality: N/A;   ENDOSCOPIC RETROGRADE CHOLANGIOPANCREATOGRAPHY (ERCP) WITH PROPOFOL  N/A 02/22/2022   Procedure: ENDOSCOPIC RETROGRADE CHOLANGIOPANCREATOGRAPHY (ERCP) WITH PROPOFOL ;  Surgeon: [name], MD;  Location: [facility] ENDOSCOPY;  Service: Gastroenterology;  Laterality: N/A;   EYE SURGERY  Jan. 2016   cataract surgery both eyes   INGUINAL HERNIA REPAIR Right 11-08-2006   IR KYPHO EA ADDL LEVEL THORACIC OR LUMBAR  02/12/2021   IR RADIOLOGIST EVAL & MGMT  02/18/2021   MASS EXCISION N/A 03/03/2016   Procedure: EXCISION OF BACK  MASS;  Surgeon: [name], MD;  Location: [facility];  Service: General;  Laterality: N/A;   MOHS SURGERY Left 1/ 2016   [name]-- Basal cell   PROSTATE SURGERY     REMOVAL OF STONES  02/22/2022   Procedure: REMOVAL OF STONES;  Surgeon: [name], MD;  Location: [facility] ENDOSCOPY;  Service: Gastroenterology;;   [name]  02/22/2022   Procedure:  SPHINCTEROTOMY;  Surgeon: [name], MD;  Location: [facility] ENDOSCOPY;  Service: Gastroenterology;;   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 N/A 06/22/2021   Procedure: TRANSURETHRAL RESECTION OF BLADDER TUMOR;  Surgeon: [name], MD;  Location: [facility];  Service: Urology;  Laterality: N/A;   TRANSURETHRAL RESECTION OF PROSTATE N/A 02/26/2013   Procedure: TRANSURETHRAL RESECTION OF THE PROSTATE WITH GYRUS INSTRUMENTS;  Surgeon: Senior Matilda, MD;  Location: [facility];  Service: Urology;  Laterality: N/A;   TRANSURETHRAL RESECTION OF PROSTATE  N/A 06/22/2021   Procedure: TRANSURETHRAL RESECTION OF THE PROSTATE (TURP);  Surgeon: [name], MD;  Location: [facility];  Service: Urology;  Laterality: N/A;   Patient Active Problem List   Diagnosis Date Noted   Bronchitis 06/16/2023   Impacted cerumen of left ear 06/16/2023   Acute encephalopathy 04/18/2023   Parkinson's disease (HCC) 04/18/2023   History of CVA with residual deficit 04/18/2023   Chronic kidney disease, stage 3b (HCC) 04/18/2023   Dementia with behavioral disturbance (HCC) 04/18/2023   Anemia of chronic disease 04/18/2023   Laceration of right upper extremity 02/24/2023   Dermatitis 02/24/2023   Lung nodules 02/24/2023   Dysuria 08/03/2022   Nonintractable headache 07/01/2022   Bilateral impacted cerumen 06/24/2022   Rash 06/15/2022   Postoperative ileus (HCC) 02/27/2022   Ileus, postoperative (HCC) 02/26/2022   Choledocholithiasis 02/19/2022   DNR (do not resuscitate) 02/19/2022   Left elbow pain 01/26/2022   Mid back pain on left side 01/26/2022   Rib pain 01/26/2022   Acute pain of left shoulder 11/12/2021   Leukocytes in urine 11/12/2021   Urinary frequency 11/12/2021   Thrush 10/08/2021   Hemiplegia, 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   Insomnia    Prediabetes    Acute 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 (HCC)    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   Transaminitis 07/27/2021   UTI (urinary tract infection) 07/27/2021   CVA (cerebral vascular accident) (HCC) 07/27/2021   Fall 07/27/2021   Hyperglycemia 07/27/2021   Cholelithiasis 07/27/2021   Hypoxia 07/27/2021   Nausea and vomiting 07/27/2021   Acute metabolic encephalopathy 07/27/2021   Normocytic anemia 07/27/2021   Chronic back pain 07/27/2021   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   Closed fracture of first lumbar vertebra with routine healing 02/03/2021   Closed fracture  of multiple ribs 11/18/2020   Anxiety 01/29/2020   Leg pain, bilateral 01/29/2020   Ingrown toenail 07/13/2019   Lumbar spondylosis 05/02/2018   Pain in left knee 03/09/2018   Osteoarthritis of left hip 01/16/2018   Trochanteric bursitis of left hip 01/16/2018   Preventative health care 09/26/2017   HTN (hypertension) 07/19/2015   Hyperlipidemia 07/19/2015   Great toe pain 02/11/2014   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   Obesity (BMI 30-39.9) 06/25/2013   Renal insufficiency 06/25/2013   Weakness of left arm 06/25/2013   Sebaceous cyst 03/03/2011   Sprain of lumbar region 07/31/2010   Rib pain, left 08/29/2009   Carotid artery stenosis, asymptomatic, bilateral 05/02/2009   Eczema, atopic 05/31/2008   Vitamin D  deficiency 03/01/2008   BPH (benign prostatic hyperplasia) 08/06/2007   Fasting hyperglycemia 12/21/2006   History of right MCA infarct 2006    ONSET DATE: 06/29/22 REFERRING DIAG:  Diagnosis  I63.9 (ICD-10-CM) - Cerebrovascular accident (CVA), unspecified mechanism (HCC)  W19.XXXD (ICD-10-CM) - Fall, subsequent encounter    THERAPY DIAG:  Muscle weakness (generalized)  Other abnormalities of gait and mobility  Hemiplegia and hemiparesis following cerebral infarction affecting left non-dominant side (HCC)  Repeated falls  Difficulty in walking, not elsewhere classified  Other lack of coordination  Attention and concentration deficit  Rationale for Evaluation and Treatment: Rehabilitation  SUBJECTIVE:                                                                                                                                                                                         SUBJECTIVE STATEMENT:  Nothing new to report per wife.   PERTINENT HISTORY:  OSTIN MATHEY is a 83 year old man with dementia, CKD, HTN, CAD, HLD and history of CVA  (2006, 2023), Parkinson's  PAIN:  Are you having pain? Faces/behavior scale- mild pain BLEs   and back   PRECAUTIONS: Fall  WEIGHT BEARING RESTRICTIONS: No  FALLS: Has patient fallen in last 6 months? No  LIVING ENVIRONMENT: Lives with: lives with their family and lives with their spouse Lives in: House/apartment Stairs: 1 brick high step Has following equipment at home: Environmental consultant - 2 wheeled, Wheelchair (manual), Shower bench, bed side commode, Grab bars, and hospital bed, sliding pad for car  PLOF: Independent with basic ADLs and Independent with household mobility without device  PATIENT GOALS: Walk around the block with his wife. Be able to use the dining room chair rather than have to use the W/C. Increased I with bed mobility and simple tasks at home like make some coffee or brush his teeth, Step over tub to shower, has bench, but he can't really use it. Up and down the one step to get to back deck. 11/23/22: Patient's wife Devere updated his goals today: Walk around store (gets distracted), swing a golf club again (thinking of driving range), get up on his own, walk without leaning forward, more recognition of the left side (from CVA), not need gait belt, OT-- be able to get dressed more on his own, navigate the newspaper.  OBJECTIVE:  TREATMENT: 12/29/23 NuStep L5 x18mins UBE L4 x91mins each way STS 2x10 Seated punches with 2# 2x10 Hitting ball with 3# WaTe Ball bouncing  Modified sit ups 2x10 Walk outside to car and transfer into car  12/13/23 Nustep level 5 x 5 minutes UBE level 4 x 6 minutes Walking with hand hold assist a lot of cues for posture and step length we did 200 feet x 3 Sit to supine to sit multiple times working on him trying to do all on his own, did need verbal cues and to get all the way up the second time needed mod A Partial sit ups in sitting [employer]  bouncing  Walking direction changes  11/22/23 Nustep level 5 x 6 minutes Partial sit ups UBE level 4 x 5 minutes On airex reaching for numbers called out Standing ball toss Gait outside with HHA, a  lot of cues and really Min/Mod A due to poor steps today  11/15/23 Nustep level 5 x 6 minutes UBE level 4 x 6 minutes In Pbars side stepping with tband on floor for step length help On airex reaching for numbers on wall 6 toe touches Standing ball toss Passive left shoulder ROM Red tband left shoulder ER/IR row and extension  11/10/23 UBE level 4 x 6 minutes Nustep level 5 x 6 minutes Partial sit ups  2.5# hip marches 2.5# LAQ Standing ball toss Standing ball kicks Side steps Gait with HHA outside out back door and around the the car  11/01/23 UBE level 4 x 6 minutes Nustep level 5 x 6 minutes Feet on ball K2C, rotation, small bridge, isometric abs Right hip extension blue tband supine Supine right hip abduction with sliding board Blue tband clamshells Bed mobility on his own with CGA Gait out the back door and then along the asphalt to his car at the front of the building with [facility]  10/27/23 Nustep level 5 x 6 minutes Bike level 5 x 5 minutes UBE level 4 x 5 minutes Seated partial sit ups Standing right arm 10# row/extension and then 5# left arm Standing with SPC 6 toe taps alternating Seated volley ball Seated 5# LAQ Seated elbow touches for core motions Passive left shoulder stretches   10/11/23 Nustep level 5 x 7 minutes Seated volley ball In pbars on airex reaching for numbers Partial sit ups Elbow touches for trunk and core Gait around the back parking [place] with HHA, needed one long seated rest break, needed a lot of cues, on the second half of [address] he was tired and really dragging his feet with significant forward trunk lean.   Passive motions of the left shoulder  10/06/23 Bike level 4 x 5 minutes Nustep level 5 x 5 minutes 3# LAQ 3# marches Seated volley ball Passive stretch left shoulder Partial sit ups On airex reaching for numbers on wall, trying to get him to use the left arm as well Gait outside with HHA half way around the parking  [place] a short break and then walking in the grass from the front of the building to the car  09/27/23 Nustep level 5 x 6 minutes Bike level 4 x 6 minutes Seated volleyball Standing volleyball Partial sit ups TUG 27 seconds but needed ModA due to a LOB, the second attempt he did in 19 seconds but uncontrolled sitting Supine bridges 2# flexion and chest press with wate bar Supine ball reaching Supine to sit with min A gait with him out to the car   09/13/23 Nustep level 5 x 7 mintues Bike level 4 x 6 minutes Gait outside with HHA/CGA 250' x 2 Supine bridges Isometric abs Left leg abduction Supine to sitting with min A Seated volley ball Seated ball kicks  09/06/23 Home assessment with wife: Bathroom set up is very cramped and crowded.  Has over commode toilet and grab bars, they have a walker in front of the commode, the way it is set up he has to walk side ways into the set up and side ways out, I lowered the front legs and put them in the shower that is directly in front and had the back legs out,  this gave him about 10 more to get b/n the toilet and the walker so he could walk in and then turn instead of side ways. Bed transfer from supine:  We did this 4 times, dropped the side rail, raised head of the bed and wife typically uses a strap that she gives Octaviano and he pulls up with it.  Today he got up without it twice, we talked about protecting [name]/s back with leaning back and having a wide base of support, also to give Octaviano cues to lean forward as he tends to be retropulsive.   Shower:  Santina in and looked at this with Devere, gave some ideas as he is now holding grab bar and stepping over the tub to get in.  Asked about safer way of sitting and bringing legs over she said this was tried but they were unable to get his legs over. Went over HEP:  in his chair talked about reciprocal patterns and she looked on [place] for a pedal machine Gait: outside with HHA to the mail box and back cues  for step length  08/25/23 Nustep level 5 x 6 minutes, cues for larger movements Bike level 4 x 6 minutes UBE level 3 x 5 mintues Worked on some bed mobility Bridges Partial sit ups Supine left leg sliding board hip abduction Seated volley ball with some cues to use the left hand Gait outside with hand hold assist and some cues for steps, some walking in grass   PATIENT EDUCATION: Education details: none new 02/16/23 Person educated: Patient and Spouse Education method: Explanation, Demonstration, Tactile cues, and Verbal cues Education comprehension: verbalized understanding, returned demonstration, verbal cues required, tactile cues required, and needs further education   ------------------------------------------------------------------------------------------------------- (Measures in this section from initial evaluation unless otherwise noted) DIAGNOSTIC FINDINGS:  MRI 2021 with degenerative changes in lumbar spine, L3-4 R subarticular stenosis COGNITION: Overall cognitive status: History of cognitive impairments - at baseline SENSATION: Not tested COORDINATION: Moderately impaired BLE MUSCLE LENGTH: Hamstrings: severely restricted B Thomas test: Severely restricted B. POSTURE: rounded shoulders, forward head, increased thoracic kyphosis, posterior pelvic tilt, and flexed trunk  LOWER EXTREMITY ROM:   BLE extremely stiff throughout, limited hip ROM in all planes, B knees limited in extension. LOWER EXTREMITY MMT:  3+/5 throughout. Unable to determine accuracy of MMT due to cognition. Functionally demosntrates poor coordinated activation and poor muscular endurance. BED MOBILITY: Min A for Supine<> Sit. Patient was using a ladder in his hospital bed and could move MI. TRANSFERS: Min A, mod VC, tends to lean back.  11/02/22 independent with use of hands on arm chair with a couple of tries CURB: Min A-Has one step out to his deck. STAIRS: N/A  GAIT: Gait pattern: step to pattern,  decreased arm swing- Right, decreased arm swing- Left, decreased step length- Right, decreased step length- Left, shuffling, festinating, trunk flexed, narrow BOS, poor foot clearance- Right, and poor foot clearance- Left Distance walked: 42' Assistive device utilized: Environmental consultant - 2 wheeled Level of assistance: Occasional min A Comments: mod VC to stay close to walker, take longer steps. FUNCTIONAL TESTS:  5 times sit to stand: 38.23  09/29/22  could not performs 5XSTS, 11/02/22 = 47 seconds with cues to use arms, 07/21/23 really struggled today 55 seconds Timed up and go (TUG): 47.91 with RW, 07/21/22 TUG 29  seconds without device, 09/29/22 25 seconds, 11/02/22 TUG 20 seconds, 07/21/23 = 26 seconds no device SBA  PATIENT EDUCATION: Education details: Progress towards goals, POC Person educated: Patient and Spouse Education method: Explanation, Demonstration, and Verbal cues  Education comprehension: verbalized understanding, returned demonstration, verbal cues required, and needs further education  HOME EXERCISE PROGRAM: PWR! moves  GOALS: Goals reviewed with patient? Yes  SHORT TERM GOALS: Target date: 12/22/22  Perform HEP for PWR! Moves with cues from family. Baseline: Pt's wife, Devere, notes walking and hitting beach ball as main HEP. Goal status: GOAL MET, 12/20/2022   2. Decrease 5 x STS to < 35 sec to demonstrate improved functional strength  Baseline: 47 seconds on 5/21 per note; 34.5 sec UE support 12/20/2022  Goal status: GOAL MET, 12/20/2022   3. Perform sit <> stand transfers from average chair height with Supervision.  Baseline: CGA  12/20/2022; (01/10/23) CGA  Goal status: NOT MET  LONG TERM GOALS: Target date: 01/13/24  Patient will perform HEP progression with caregiver assist. Baseline: has minimal HEP Goal status: progressing  2.  Decrease 5 x STS to < 25 sec to demonstrate improved functional strength Baseline:  47 seconds on 5/21 per note; (01/10/23) 36 sec w/ cues for speed/sequence>30.38 sec with BUE supprot Goal status: PARTIALLY MET, 01/24/2023  3. Improve quality of gait to be able to ambulate x 100 feet with supervision. Baseline: CGA with cues  Goal status: Met 08/18/23  4.  Patient will perform his bed mobility with MI including rolling, sit<>supine, and scooting in bed. Baseline: on hard surface mat he is independent but on soft bed at home he struggles, wife reports only needs a hand to get up now 11/02/22; (01/10/23) modified indep.  Goal status: MET  5.  Patient will demonstrate floor<>stand with CGA and verbal cues for patient's wife to be able to assist. Baseline:  Has to call 9-1-1; +2 max assist floor>stand with UE support 01/19/2023 Goal status: GOAL NOT MET 01/19/2023  **UPDATED GOALS BELOW ** per recert 05/17/2023  SHORT TERM GOALS: Target date: 02/25/2023   Pt will perform HEP to maintain lower body strength, flexibility, posture with wife's assistance. Baseline:  Goal status: met 04/12/23  2. Pt will perform sit to stand, 8 of 10 reps with BUE support, with supervision, to maintain safety with transfers.  Baseline: min guard  Goal status: 02/07/23-CGA from elevated surface and max VC to lean forward, ongoing   LONG TERM GOALS: Target date: 07/18/23   Patient will perform HEP progression to maintain lower body strength, flexibility, posture with wife's assistance. Baseline:  Goal status: doing some walking with his wife when she feels safe, has done some pedaling at home as well 12/13/23  2.  Pt will ambulate 150-175 ft in 2 MWT with min guard assist, to preserve functional mobility within the home.  Baseline: Goal status:  met 08/25/23  3. Wife will verbalize tips for fall prevention education to minimize fall frequency Baseline:  Goal status:progressing and doing well 4.  Patient will be able to toilet without freezing for over a 4 week period met 11/15/23 5.  Patient will be able  to get out of bed from supine with wife assist minimal Assist,met 10/06/23   ASSESSMENT:  CLINICAL IMPRESSION:  Patient return with a bit more shuffling today and some veering with gait. Needs cues throughout session to stay on tasks. Tried to get him to do big movements and reciprocal movements. STS were difficult without using legs against table. More shuffling when outside, a bit unsafe with car transfer and puts his feet in first.  OBJECTIVE IMPAIRMENTS: Abnormal gait, decreased activity tolerance, decreased balance, decreased cognition, decreased coordination, decreased endurance, decreased mobility, difficulty walking, decreased ROM, decreased strength, decreased safety awareness, impaired flexibility, impaired UE functional use, improper body mechanics, and postural dysfunction.   PLAN:  PT FREQUENCY: 1x/week  PT DURATION: 12 weeks  PLANNED INTERVENTIONS: Therapeutic exercises, Therapeutic activity, Neuromuscular re-education, Balance training, Gait training, Patient/Family education, Self Care, Joint mobilization, Stair training, Moist heat, and Manual therapy  PLAN FOR NEXT SESSION:   Will continue to work wiht patient to help him and his wife stay living at home and with less difficulty   Patient Details  Name: William [name] MRN: [id number] Date of Birth: [birth year] Referring Provider:  [name], *  Ozell Mainland, PT

## 2023-12-29 ENCOUNTER — Other Ambulatory Visit: Payer: Self-pay | Admitting: Family Medicine

## 2023-12-29 ENCOUNTER — Other Ambulatory Visit: Payer: Self-pay

## 2023-12-29 ENCOUNTER — Ambulatory Visit

## 2023-12-29 ENCOUNTER — Other Ambulatory Visit (HOSPITAL_BASED_OUTPATIENT_CLINIC_OR_DEPARTMENT_OTHER): Payer: Self-pay

## 2023-12-29 DIAGNOSIS — I69354 Hemiplegia and hemiparesis following cerebral infarction affecting left non-dominant side: Secondary | ICD-10-CM

## 2023-12-29 DIAGNOSIS — R41840 Attention and concentration deficit: Secondary | ICD-10-CM

## 2023-12-29 DIAGNOSIS — R296 Repeated falls: Secondary | ICD-10-CM

## 2023-12-29 DIAGNOSIS — R278 Other lack of coordination: Secondary | ICD-10-CM

## 2023-12-29 DIAGNOSIS — M6281 Muscle weakness (generalized): Secondary | ICD-10-CM | POA: Diagnosis not present

## 2023-12-29 DIAGNOSIS — R2689 Other abnormalities of gait and mobility: Secondary | ICD-10-CM

## 2023-12-29 DIAGNOSIS — R262 Difficulty in walking, not elsewhere classified: Secondary | ICD-10-CM

## 2023-12-29 MED ORDER — AMLODIPINE BESYLATE 10 MG PO TABS
10.0000 mg | ORAL_TABLET | Freq: Every morning | ORAL | 1 refills | Status: DC
  Filled 2023-12-29: qty 90, 90d supply, fill #0
  Filled 2024-03-30: qty 90, 90d supply, fill #1

## 2024-01-03 ENCOUNTER — Encounter: Payer: Self-pay | Admitting: Physical Therapy

## 2024-01-03 ENCOUNTER — Ambulatory Visit: Admitting: Physical Therapy

## 2024-01-03 DIAGNOSIS — I69354 Hemiplegia and hemiparesis following cerebral infarction affecting left non-dominant side: Secondary | ICD-10-CM

## 2024-01-03 DIAGNOSIS — R262 Difficulty in walking, not elsewhere classified: Secondary | ICD-10-CM

## 2024-01-03 DIAGNOSIS — R2689 Other abnormalities of gait and mobility: Secondary | ICD-10-CM

## 2024-01-03 DIAGNOSIS — M6281 Muscle weakness (generalized): Secondary | ICD-10-CM | POA: Diagnosis not present

## 2024-01-03 DIAGNOSIS — M25512 Pain in left shoulder: Secondary | ICD-10-CM

## 2024-01-03 DIAGNOSIS — R296 Repeated falls: Secondary | ICD-10-CM

## 2024-01-03 NOTE — Therapy (Signed)
 OUTPATIENT PHYSICAL THERAPY NEURO TREATMENT  Patient Name: William [name] MRN: [id number] DOB:1940-08-09, 83 y.o., male Today's Date: 01/03/2024  PCP: William Cyndee [name]  DO REFERRING PROVIDER: same   END OF SESSION:  PT End of Session - 01/03/24 1528     Visit Number [id number]    Date for PT Re-Evaluation 01/13/24    Authorization Type HTA    PT Start Time 1528    PT Stop Time 1612    PT Time Calculation (min) 44 min    Equipment Utilized During Treatment Gait belt    Activity Tolerance Patient tolerated treatment well    Behavior During Therapy WFL for tasks assessed/performed              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   HTN (hypertension) 07/19/2015   Hyperlipidemia    Major neurocognitive disorder 01/09/2014   Mild, related to stroke history   Nocturia    Renal insufficiency 06/25/2013   S/P carotid endarterectomy    BILATERAL ICA--  PATENT PER DUPLEX  05-19-2012   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/26/2017   Right calf   Urinary frequency    Vitamin D  deficiency    Past Surgical History:  Procedure Laterality Date   APPENDECTOMY  AS CHILD   CARDIOVASCULAR STRESS TEST  03-27-2012  DR William [name]   LOW RISK LEXISCAN  STUDY-- PROBABLE NORMAL PERFUSION AND SOFT TISSUE ATTENUATION/  NO ISCHEMIA/ EF 51%   CAROTID ENDARTERECTOMY Bilateral LEFT  11-12-2008  DR William [name]   RIGHT ICA  2006  ([facility])   CHOLECYSTECTOMY N/A 02/23/2022   Procedure: LAPAROSCOPIC CHOLECYSTECTOMY;  Surgeon: William Deward JINNY, MD;  Location: [facility];  Service: General;   Laterality: N/A;   CYSTOSCOPY W/ RETROGRADES Bilateral 06/22/2021   Procedure: CYSTOSCOPY WITH RETROGRADE PYELOGRAM;  Surgeon: William Senior, MD;  Location: [name];  Service: Urology;  Laterality: Bilateral;   CYSTOSCOPY WITH LITHOLAPAXY N/A 02/26/2013   Procedure: CYSTOSCOPY WITH LITHOLAPAXY;  Surgeon: [name] Matilda, MD;  Location: [name];  Service: Urology;  Laterality: N/A;   ENDOSCOPIC RETROGRADE CHOLANGIOPANCREATOGRAPHY (ERCP) WITH PROPOFOL  N/A 02/22/2022   Procedure: ENDOSCOPIC RETROGRADE CHOLANGIOPANCREATOGRAPHY (ERCP) WITH PROPOFOL ;  Surgeon: William Dover, MD;  Location: [facility] ENDOSCOPY;  Service: Gastroenterology;  Laterality: N/A;   EYE SURGERY  Jan. 2016   cataract surgery both eyes   INGUINAL HERNIA REPAIR Right 11-08-2006   IR KYPHO EA ADDL LEVEL THORACIC OR LUMBAR  02/12/2021   IR RADIOLOGIST EVAL & MGMT  02/18/2021   MASS EXCISION N/A 03/03/2016   Procedure: EXCISION OF BACK  MASS;  Surgeon: [name], MD;  Location: [facility];  Service: General;  Laterality: N/A;   MOHS SURGERY Left 1/ 2016   Dr William [name]-- Basal cell   PROSTATE SURGERY     REMOVAL OF STONES  02/22/2022   Procedure: REMOVAL OF STONES;  Surgeon: William Dover, MD;  Location: [facility] ENDOSCOPY;  Service: Gastroenterology;;   William [name]  02/22/2022   Procedure:  SPHINCTEROTOMY;  Surgeon: William Dover, MD;  Location: [facility] ENDOSCOPY;  Service: Gastroenterology;;   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 N/A 06/22/2021   Procedure: TRANSURETHRAL RESECTION OF BLADDER TUMOR;  Surgeon: William Senior, MD;  Location: [facility];  Service: Urology;  Laterality: N/A;   TRANSURETHRAL RESECTION OF PROSTATE N/A 02/26/2013   Procedure: TRANSURETHRAL RESECTION OF THE PROSTATE WITH GYRUS INSTRUMENTS;  Surgeon: [name] Matilda, MD;  Location: [facility];  Service: Urology;  Laterality: N/A;   TRANSURETHRAL RESECTION OF PROSTATE  N/A 06/22/2021   Procedure: TRANSURETHRAL RESECTION OF THE PROSTATE (TURP);  Surgeon: William Senior, MD;  Location: [facility];  Service: Urology;  Laterality: N/A;   Patient Active Problem List   Diagnosis Date Noted   Bronchitis 06/16/2023   Impacted cerumen of left ear 06/16/2023   Acute encephalopathy 04/18/2023   Parkinson's disease (HCC) 04/18/2023   History of CVA with residual deficit 04/18/2023   Chronic kidney disease, stage 3b (HCC) 04/18/2023   Dementia with behavioral disturbance (HCC) 04/18/2023   Anemia of chronic disease 04/18/2023   Laceration of right upper extremity 02/24/2023   Dermatitis 02/24/2023   Lung nodules 02/24/2023   Dysuria 08/03/2022   Nonintractable headache 07/01/2022   Bilateral impacted cerumen 06/24/2022   Rash 06/15/2022   Postoperative ileus (HCC) 02/27/2022   Ileus, postoperative (HCC) 02/26/2022   Choledocholithiasis 02/19/2022   DNR (do not resuscitate) 02/19/2022   Left elbow pain 01/26/2022   Mid back pain on left side 01/26/2022   Rib pain 01/26/2022   Acute pain of left shoulder 11/12/2021   Leukocytes in urine 11/12/2021   Urinary frequency 11/12/2021   Thrush 10/08/2021   Hemiplegia, 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   Insomnia    Prediabetes    Acute 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 (HCC)    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   Transaminitis 07/27/2021   UTI (urinary tract infection) 07/27/2021   CVA (cerebral vascular accident) (HCC) 07/27/2021   Fall 07/27/2021   Hyperglycemia 07/27/2021   Cholelithiasis 07/27/2021   Hypoxia 07/27/2021   Nausea and vomiting 07/27/2021   Acute metabolic encephalopathy 07/27/2021   Normocytic anemia 07/27/2021   Chronic back pain 07/27/2021   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   Closed fracture of first lumbar vertebra with routine healing 02/03/2021   Closed fracture  of multiple ribs 11/18/2020   Anxiety 01/29/2020   Leg pain, bilateral 01/29/2020   Ingrown toenail 07/13/2019   Lumbar spondylosis 05/02/2018   Pain in left knee 03/09/2018   Osteoarthritis of left hip 01/16/2018   Trochanteric bursitis of left hip 01/16/2018   Preventative health care 09/26/2017   HTN (hypertension) 07/19/2015   Hyperlipidemia 07/19/2015   Great toe pain 02/11/2014   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   Obesity (BMI 30-39.9) 06/25/2013   Renal insufficiency 06/25/2013   Weakness of left arm 06/25/2013   Sebaceous cyst 03/03/2011   Sprain of lumbar region 07/31/2010   Rib pain, left 08/29/2009   Carotid artery stenosis, asymptomatic, bilateral 05/02/2009   Eczema, atopic 05/31/2008   Vitamin D  deficiency 03/01/2008   BPH (benign prostatic hyperplasia) 08/06/2007   Fasting hyperglycemia 12/21/2006   History of right MCA infarct 2006    ONSET DATE: 06/29/22 REFERRING DIAG:  Diagnosis  I63.9 (ICD-10-CM) - Cerebrovascular accident (CVA), unspecified mechanism (HCC)  W19.XXXD (ICD-10-CM) - Fall, subsequent encounter    THERAPY DIAG:  Muscle weakness (generalized)  Other abnormalities of gait and mobility  Hemiplegia and hemiparesis following cerebral infarction affecting left non-dominant side (HCC)  Repeated falls  Difficulty in walking, not elsewhere classified  Acute pain of left shoulder  Rationale for Evaluation and Treatment: Rehabilitation  SUBJECTIVE:                                                                                                                                                                                         SUBJECTIVE STATEMENT:  Doing okay.  Denies falls  PERTINENT HISTORY:  OSA CAMPOLI is a 83 year old man with dementia, CKD, HTN, CAD, HLD and history of CVA  (2006, 2023), Parkinson's  PAIN:  Are you having pain? Faces/behavior scale- mild pain BLEs  and back   PRECAUTIONS: Fall  WEIGHT  BEARING RESTRICTIONS: No  FALLS: Has patient fallen in last 6 months? No  LIVING ENVIRONMENT: Lives with: lives with their family and lives with their spouse Lives in: House/apartment Stairs: 1 brick high step Has following equipment at home: Environmental consultant - 2 wheeled, Wheelchair (manual), Shower bench, bed side commode, Grab bars, and hospital bed, sliding pad for car  PLOF: Independent with basic ADLs and Independent with household mobility without device  PATIENT GOALS: Walk around the block with his wife. Be able to use the dining room chair rather than have to use the W/C. Increased I with bed mobility and simple tasks at home like make some coffee or brush his teeth, Step over tub to shower, has bench, but he can't really use it. Up and down the one step to get to back deck. 11/23/22: Patient's wife Devere updated his goals today: Walk around store (gets distracted), swing a golf club again (thinking of driving range), get up on his own, walk without leaning forward, more recognition of the left side (from CVA), not need gait belt, OT-- be able to get dressed more on his own, navigate the newspaper.  OBJECTIVE:  TREATMENT: 01/03/24 Nustep level 5 x 8 minutes, cues to keep going and for bigger motions Bike level 4 x 6 minutes Partial sit ups 3# wate bar hitting ball Walking with HHA x 300 feet in side x 3 Side stepping, fwd and backward walking Ball bounce Ball kicks Gait out back door in grass to the car with HHA and a lot of cues  12/29/23 NuStep L5 x8mins UBE L4 x78mins each way STS 2x10 Seated punches with 2# 2x10 Hitting ball with 3# WaTe Ball bouncing  Modified sit ups 2x10 Walk outside to car and transfer into car  12/13/23 Nustep level 5 x 5 minutes UBE level 4 x 6 minutes  Walking with hand hold assist a lot of cues for posture and step length we did 200 feet x 3 Sit to supine to sit multiple times working on him trying to do all on his own, did need verbal cues and to get  all the way up the second time needed mod A Partial sit ups in sitting Ball bouncing  Walking direction changes  11/22/23 Nustep level 5 x 6 minutes Partial sit ups UBE level 4 x 5 minutes On airex reaching for numbers called out Standing ball toss Gait outside with HHA, a lot of cues and really Min/Mod A due to poor steps today  11/15/23 Nustep level 5 x 6 minutes UBE level 4 x 6 minutes In Pbars side stepping with tband on floor for step length help On airex reaching for numbers on wall 6 toe touches Standing ball toss Passive left shoulder ROM Red tband left shoulder ER/IR row and extension  11/10/23 UBE level 4 x 6 minutes Nustep level 5 x 6 minutes Partial sit ups  2.5# hip marches 2.5# LAQ Standing ball toss Standing ball kicks Side steps Gait with HHA outside out back door and around the the car  11/01/23 UBE level 4 x 6 minutes Nustep level 5 x 6 minutes Feet on ball K2C, rotation, small bridge, isometric abs Right hip extension blue tband supine Supine right hip abduction with sliding board Blue tband clamshells Bed mobility on his own with CGA Gait out the back door and then along the asphalt to his car at the front of the building with [facility]  10/27/23 Nustep level 5 x 6 minutes Bike level 5 x 5 minutes UBE level 4 x 5 minutes Seated partial sit ups Standing right arm 10# row/extension and then 5# left arm Standing with SPC 6 toe taps alternating Seated volley ball Seated 5# LAQ Seated elbow touches for core motions Passive left shoulder stretches   10/11/23 Nustep level 5 x 7 minutes Seated volley ball In pbars on airex reaching for numbers Partial sit ups Elbow touches for trunk and core Gait around the back parking [place] with HHA, needed one long seated rest break, needed a lot of cues, on the second half of [address] he was tired and really dragging his feet with significant forward trunk lean.   Passive motions of the left  shoulder  10/06/23 Bike level 4 x 5 minutes Nustep level 5 x 5 minutes 3# LAQ 3# marches Seated volley ball Passive stretch left shoulder Partial sit ups On airex reaching for numbers on wall, trying to get him to use the left arm as well Gait outside with HHA half way around the parking [place] a short break and then walking in the grass from the front of the building to the car  09/27/23 Nustep level 5 x 6 minutes Bike level 4 x 6 minutes Seated volleyball Standing volleyball Partial sit ups TUG 27 seconds but needed ModA due to a LOB, the second attempt he did in 19 seconds but uncontrolled sitting Supine bridges 2# flexion and chest press with wate bar Supine ball reaching Supine to sit with min A gait with him out to the car   09/13/23 Nustep level 5 x 7 mintues Bike level 4 x 6 minutes Gait outside with HHA/CGA 250' x 2 Supine bridges Isometric abs Left leg abduction Supine to sitting with min A Seated volley ball Seated ball kicks  09/06/23 Home assessment with wife: Bathroom set up is very cramped and  crowded.  Has over commode toilet and grab bars, they have a walker in front of the commode, the way it is set up he has to walk side ways into the set up and side ways out, I lowered the front legs and put them in the shower that is directly in front and had the back legs out, this gave him about 10 more to get b/n the toilet and the walker so he could walk in and then turn instead of side ways. Bed transfer from supine:  We did this 4 times, dropped the side rail, raised head of the bed and wife typically uses a strap that she gives Octaviano and he pulls up with it.  Today he got up without it twice, we talked about protecting [name]/s back with leaning back and having a wide base of support, also to give Octaviano cues to lean forward as he tends to be retropulsive.   Shower:  Santina in and looked at this with Devere, gave some ideas as he is now holding grab bar and stepping over the  tub to get in.  Asked about safer way of sitting and bringing legs over she said this was tried but they were unable to get his legs over. Went over HEP:  in his chair talked about reciprocal patterns and she looked on [place] for a pedal machine Gait: outside with HHA to the mail box and back cues for step length  08/25/23 Nustep level 5 x 6 minutes, cues for larger movements Bike level 4 x 6 minutes UBE level 3 x 5 mintues Worked on some bed mobility Bridges Partial sit ups Supine left leg sliding board hip abduction Seated volley ball with some cues to use the left hand Gait outside with hand hold assist and some cues for steps, some walking in grass   PATIENT EDUCATION: Education details: none new 02/16/23 Person educated: Patient and Spouse Education method: Explanation, Demonstration, Tactile cues, and Verbal cues Education comprehension: verbalized understanding, returned demonstration, verbal cues required, tactile cues required, and needs further education   ------------------------------------------------------------------------------------------------------- (Measures in this section from initial evaluation unless otherwise noted) DIAGNOSTIC FINDINGS:  MRI 2021 with degenerative changes in lumbar spine, L3-4 R subarticular stenosis COGNITION: Overall cognitive status: History of cognitive impairments - at baseline SENSATION: Not tested COORDINATION: Moderately impaired BLE MUSCLE LENGTH: Hamstrings: severely restricted B Thomas test: Severely restricted B. POSTURE: rounded shoulders, forward head, increased thoracic kyphosis, posterior pelvic tilt, and flexed trunk  LOWER EXTREMITY ROM:   BLE extremely stiff throughout, limited hip ROM in all planes, B knees limited in extension. LOWER EXTREMITY MMT:  3+/5 throughout. Unable to determine accuracy of MMT due to cognition. Functionally demosntrates poor coordinated activation and poor muscular endurance. BED MOBILITY: Min A  for Supine<> Sit. Patient was using a ladder in his hospital bed and could move MI. TRANSFERS: Min A, mod VC, tends to lean back.  11/02/22 independent with use of hands on arm chair with a couple of tries CURB: Min A-Has one step out to his deck. STAIRS: N/A  GAIT: Gait pattern: step to pattern, decreased arm swing- Right, decreased arm swing- Left, decreased step length- Right, decreased step length- Left, shuffling, festinating, trunk flexed, narrow BOS, poor foot clearance- Right, and poor foot clearance- Left Distance walked: 68' Assistive device utilized: Environmental consultant - 2 wheeled Level of assistance: Occasional min A Comments: mod VC to stay close to walker, take longer steps. FUNCTIONAL TESTS:  5 times sit to stand: 38.23  09/29/22  could not performs 5XSTS, 11/02/22 = 47 seconds with cues to use arms, 07/21/23 really struggled today 55 seconds Timed up and go (TUG): 47.91 with RW, 07/21/22 TUG 29  seconds without device, 09/29/22 25 seconds, 11/02/22 TUG 20 seconds, 07/21/23 = 26 seconds no device SBA                                                                     PATIENT EDUCATION: Education details: Progress towards goals, POC Person educated: Patient and Spouse Education method: Explanation, Facilities manager, and Verbal cues  Education comprehension: verbalized understanding, returned demonstration, verbal cues required, and needs further education  HOME EXERCISE PROGRAM: PWR! moves  GOALS: Goals reviewed with patient? Yes  SHORT TERM GOALS: Target date: 12/22/22  Perform HEP for PWR! Moves with cues from family. Baseline: Pt's wife, Devere, notes walking and hitting beach ball as main HEP. Goal status: GOAL MET, 12/20/2022   2. Decrease 5 x STS to < 35 sec to demonstrate improved functional strength  Baseline: 47 seconds on 5/21 per note; 34.5 sec UE support 12/20/2022  Goal status: GOAL MET, 12/20/2022   3. Perform sit <> stand transfers from average chair height with  Supervision.  Baseline: CGA  12/20/2022; (01/10/23) CGA  Goal status: NOT MET  LONG TERM GOALS: Target date: 01/13/24  Patient will perform HEP progression with caregiver assist. Baseline: has minimal HEP Goal status: progressing  2.  Decrease 5 x STS to < 25 sec to demonstrate improved functional strength Baseline: 47 seconds on 5/21 per note; (01/10/23) 36 sec w/ cues for speed/sequence>30.38 sec with BUE supprot Goal status: PARTIALLY MET, 01/24/2023  3. Improve quality of gait to be able to ambulate x 100 feet with supervision. Baseline: CGA with cues  Goal status: Met 08/18/23  4.  Patient will perform his bed mobility with MI including rolling, sit<>supine, and scooting in bed. Baseline: on hard surface mat he is independent but on soft bed at home he struggles, wife reports only needs a hand to get up now 11/02/22; (01/10/23) modified indep.  Goal status: MET  5.  Patient will demonstrate floor<>stand with CGA and verbal cues for patient's wife to be able to assist. Baseline:  Has to call 9-1-1; +2 max assist floor>stand with UE support 01/19/2023 Goal status: GOAL NOT MET 01/19/2023  **UPDATED GOALS BELOW ** per recert 05/17/2023  SHORT TERM GOALS: Target date: 02/25/2023   Pt will perform HEP to maintain lower body strength, flexibility, posture with wife's assistance. Baseline:  Goal status: met 04/12/23  2. Pt will perform sit to stand, 8 of 10 reps with BUE support, with supervision, to maintain safety with transfers.  Baseline: min guard  Goal status: 02/07/23-CGA from elevated surface and max VC to lean forward, ongoing   LONG TERM GOALS: Target date: 07/18/23   Patient will perform HEP progression to maintain lower body strength, flexibility, posture with wife's assistance. Baseline:  Goal status: doing some walking with his wife when she feels safe, has done some pedaling at home as well 12/13/23  2.  Pt will ambulate 150-175 ft in 2 MWT with min guard assist, to preserve  functional mobility within the home.  Baseline: Goal status:  met 08/25/23  3. Wife will  verbalize tips for fall prevention education to minimize fall frequency Baseline:  Goal status:progressing and doing well 4.  Patient will be able to toilet without freezing for over a 4 week period met 11/15/23 5.  Patient will be able to get out of bed from supine with wife assist minimal Assist,met 10/06/23   ASSESSMENT:  CLINICAL IMPRESSION:  The current plan is absolutely the best for the patient and his wife as there is no way he could maintain this on his own and at home, she is trying to walk him to the mailbox on a daily basis.  She reports that he is doing much better getting out of bed, less help from her, he still needs CGA for walking and there are times that he shuffles and stumbles.   OBJECTIVE IMPAIRMENTS: Abnormal gait, decreased activity tolerance, decreased balance, decreased cognition, decreased coordination, decreased endurance, decreased mobility, difficulty walking, decreased ROM, decreased strength, decreased safety awareness, impaired flexibility, impaired UE functional use, improper body mechanics, and postural dysfunction.   PLAN:  PT FREQUENCY: 1x/week  PT DURATION: 12 weeks  PLANNED INTERVENTIONS: Therapeutic exercises, Therapeutic activity, Neuromuscular re-education, Balance training, Gait training, Patient/Family education, Self Care, Joint mobilization, Stair training, Moist heat, and Manual therapy  PLAN FOR NEXT SESSION:   Will continue to work wiht patient to help him and his wife stay living at home and with less difficulty   Patient Details  Name: William [name] MRN: [id number] Date of Birth: 1940/08/12 Referring Provider:  [name] Jamee [name], *  Ozell Mainland, PT

## 2024-01-09 ENCOUNTER — Other Ambulatory Visit: Payer: Self-pay

## 2024-01-09 ENCOUNTER — Other Ambulatory Visit (HOSPITAL_BASED_OUTPATIENT_CLINIC_OR_DEPARTMENT_OTHER): Payer: Self-pay

## 2024-01-10 ENCOUNTER — Other Ambulatory Visit: Payer: Self-pay

## 2024-01-10 ENCOUNTER — Encounter: Payer: Self-pay | Admitting: Physical Therapy

## 2024-01-10 ENCOUNTER — Ambulatory Visit: Admitting: Physical Therapy

## 2024-01-10 DIAGNOSIS — R296 Repeated falls: Secondary | ICD-10-CM

## 2024-01-10 DIAGNOSIS — I69354 Hemiplegia and hemiparesis following cerebral infarction affecting left non-dominant side: Secondary | ICD-10-CM

## 2024-01-10 DIAGNOSIS — M25512 Pain in left shoulder: Secondary | ICD-10-CM

## 2024-01-10 DIAGNOSIS — M6281 Muscle weakness (generalized): Secondary | ICD-10-CM | POA: Diagnosis not present

## 2024-01-10 DIAGNOSIS — R2689 Other abnormalities of gait and mobility: Secondary | ICD-10-CM

## 2024-01-10 DIAGNOSIS — R262 Difficulty in walking, not elsewhere classified: Secondary | ICD-10-CM

## 2024-01-10 NOTE — Therapy (Signed)
 OUTPATIENT PHYSICAL THERAPY NEURO TREATMENT  Patient Name: William [name] MRN: [id number] DOB:August 20, 1940, 83 y.o., male Today's Date: 01/10/2024  PCP: [name]  DO REFERRING PROVIDER: same   END OF SESSION:  PT End of Session - 01/10/24 1536     Visit Number [id number]    Date for PT Re-Evaluation 01/13/24    Authorization Type HTA    PT Start Time 1530    PT Stop Time 1615    PT Time Calculation (min) 45 min    Equipment Utilized During Treatment Gait belt    Activity Tolerance Patient tolerated treatment well    Behavior During Therapy WFL for tasks assessed/performed              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   HTN (hypertension) 07/19/2015   Hyperlipidemia    Major neurocognitive disorder 01/09/2014   Mild, related to stroke history   Nocturia    Renal insufficiency 06/25/2013   S/P carotid endarterectomy    BILATERAL ICA--  PATENT PER DUPLEX  05-19-2012   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/26/2017   Right calf   Urinary frequency    Vitamin D  deficiency    Past Surgical History:  Procedure Laterality Date   APPENDECTOMY  AS CHILD   CARDIOVASCULAR STRESS TEST  03-27-2012  DR CRENSHAW   LOW RISK LEXISCAN  STUDY-- PROBABLE NORMAL PERFUSION AND SOFT TISSUE ATTENUATION/  NO ISCHEMIA/ EF 51%   CAROTID ENDARTERECTOMY Bilateral LEFT  11-12-2008  DR GREG HAYES   RIGHT ICA  2006  ([facility])   CHOLECYSTECTOMY N/A 02/23/2022   Procedure: LAPAROSCOPIC CHOLECYSTECTOMY;  Surgeon: [name], MD;  Location: [facility] OR;  Service: General;   Laterality: N/A;   CYSTOSCOPY W/ RETROGRADES Bilateral 06/22/2021   Procedure: CYSTOSCOPY WITH RETROGRADE PYELOGRAM;  Surgeon: [name], MD;  Location: [facility] [place];  Service: Urology;  Laterality: Bilateral;   CYSTOSCOPY WITH LITHOLAPAXY N/A 02/26/2013   Procedure: CYSTOSCOPY WITH LITHOLAPAXY;  Surgeon: Senior Matilda, MD;  Location: [facility];  Service: Urology;  Laterality: N/A;   ENDOSCOPIC RETROGRADE CHOLANGIOPANCREATOGRAPHY (ERCP) WITH PROPOFOL  N/A 02/22/2022   Procedure: ENDOSCOPIC RETROGRADE CHOLANGIOPANCREATOGRAPHY (ERCP) WITH PROPOFOL ;  Surgeon: [name], MD;  Location: [facility] ENDOSCOPY;  Service: Gastroenterology;  Laterality: N/A;   EYE SURGERY  Jan. 2016   cataract surgery both eyes   INGUINAL HERNIA REPAIR Right 11-08-2006   IR KYPHO EA ADDL LEVEL THORACIC OR LUMBAR  02/12/2021   IR RADIOLOGIST EVAL & MGMT  02/18/2021   MASS EXCISION N/A 03/03/2016   Procedure: EXCISION OF BACK  MASS;  Surgeon: [name], MD;  Location:  SURGERY CENTER;  Service: General;  Laterality: N/A;   MOHS SURGERY Left 1/ 2016   [name]-- Basal cell   PROSTATE SURGERY     REMOVAL OF STONES  02/22/2022   Procedure: REMOVAL OF STONES;  Surgeon: [name], MD;  Location: [facility] ENDOSCOPY;  Service: Gastroenterology;;   [name]  02/22/2022   Procedure:  SPHINCTEROTOMY;  Surgeon: [name], MD;  Location: [facility] ENDOSCOPY;  Service: Gastroenterology;;   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 N/A 06/22/2021   Procedure: TRANSURETHRAL RESECTION OF BLADDER TUMOR;  Surgeon: [name], MD;  Location: [facility];  Service: Urology;  Laterality: N/A;   TRANSURETHRAL RESECTION OF PROSTATE N/A 02/26/2013   Procedure: TRANSURETHRAL RESECTION OF THE PROSTATE WITH GYRUS INSTRUMENTS;  Surgeon: Senior Matilda, MD;  Location: [facility];  Service: Urology;  Laterality: N/A;   TRANSURETHRAL RESECTION OF PROSTATE  N/A 06/22/2021   Procedure: TRANSURETHRAL RESECTION OF THE PROSTATE (TURP);  Surgeon: [name], MD;  Location: [facility];  Service: Urology;  Laterality: N/A;   Patient Active Problem List   Diagnosis Date Noted   Bronchitis 06/16/2023   Impacted cerumen of left ear 06/16/2023   Acute encephalopathy 04/18/2023   Parkinson's disease (HCC) 04/18/2023   History of CVA with residual deficit 04/18/2023   Chronic kidney disease, stage 3b (HCC) 04/18/2023   Dementia with behavioral disturbance (HCC) 04/18/2023   Anemia of chronic disease 04/18/2023   Laceration of right upper extremity 02/24/2023   Dermatitis 02/24/2023   Lung nodules 02/24/2023   Dysuria 08/03/2022   Nonintractable headache 07/01/2022   Bilateral impacted cerumen 06/24/2022   Rash 06/15/2022   Postoperative ileus (HCC) 02/27/2022   Ileus, postoperative (HCC) 02/26/2022   Choledocholithiasis 02/19/2022   DNR (do not resuscitate) 02/19/2022   Left elbow pain 01/26/2022   Mid back pain on left side 01/26/2022   Rib pain 01/26/2022   Acute pain of left shoulder 11/12/2021   Leukocytes in urine 11/12/2021   Urinary frequency 11/12/2021   Thrush 10/08/2021   Hemiplegia, 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   Insomnia    Prediabetes    Acute 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 (HCC)    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   Transaminitis 07/27/2021   UTI (urinary tract infection) 07/27/2021   CVA (cerebral vascular accident) (HCC) 07/27/2021   Fall 07/27/2021   Hyperglycemia 07/27/2021   Cholelithiasis 07/27/2021   Hypoxia 07/27/2021   Nausea and vomiting 07/27/2021   Acute metabolic encephalopathy 07/27/2021   Normocytic anemia 07/27/2021   Chronic back pain 07/27/2021   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   Closed fracture of first lumbar vertebra with routine healing 02/03/2021   Closed fracture  of multiple ribs 11/18/2020   Anxiety 01/29/2020   Leg pain, bilateral 01/29/2020   Ingrown toenail 07/13/2019   Lumbar spondylosis 05/02/2018   Pain in left knee 03/09/2018   Osteoarthritis of left hip 01/16/2018   Trochanteric bursitis of left hip 01/16/2018   Preventative health care 09/26/2017   HTN (hypertension) 07/19/2015   Hyperlipidemia 07/19/2015   Great toe pain 02/11/2014   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   Obesity (BMI 30-39.9) 06/25/2013   Renal insufficiency 06/25/2013   Weakness of left arm 06/25/2013   Sebaceous cyst 03/03/2011   Sprain of lumbar region 07/31/2010   Rib pain, left 08/29/2009   Carotid artery stenosis, asymptomatic, bilateral 05/02/2009   Eczema, atopic 05/31/2008   Vitamin D  deficiency 03/01/2008   BPH (benign prostatic hyperplasia) 08/06/2007   Fasting hyperglycemia 12/21/2006   History of right MCA infarct 2006    ONSET DATE: 06/29/22 REFERRING DIAG:  Diagnosis  I63.9 (ICD-10-CM) - Cerebrovascular accident (CVA), unspecified mechanism (HCC)  W19.XXXD (ICD-10-CM) - Fall, subsequent encounter    THERAPY DIAG:  Muscle weakness (generalized)  Other abnormalities of gait and mobility  Hemiplegia and hemiparesis following cerebral infarction affecting left non-dominant side (HCC)  Repeated falls  Difficulty in walking, not elsewhere classified  Acute pain of left shoulder  Rationale for Evaluation and Treatment: Rehabilitation  SUBJECTIVE:                                                                                                                                                                                         SUBJECTIVE STATEMENT:  No falls, wife reports that she is walking with him to the mailbox most days.  PERTINENT HISTORY:  KEMPTON MILNE is a 83 year old man with dementia, CKD, HTN, CAD, HLD and history of CVA  (2006, 2023), Parkinson's  PAIN:  Are you having pain? Faces/behavior scale- mild pain  BLEs  and back   PRECAUTIONS: Fall  WEIGHT BEARING RESTRICTIONS: No  FALLS: Has patient fallen in last 6 months? No  LIVING ENVIRONMENT: Lives with: lives with their family and lives with their spouse Lives in: House/apartment Stairs: 1 brick high step Has following equipment at home: Environmental consultant - 2 wheeled, Wheelchair (manual), Shower bench, bed side commode, Grab bars, and hospital bed, sliding pad for car  PLOF: Independent with basic ADLs and Independent with household mobility without device  PATIENT GOALS: Walk around the block with his wife. Be able to use the dining room chair rather than have to use the W/C. Increased I with bed mobility and simple tasks at home like make some coffee or brush his teeth, Step over tub to shower, has bench, but he can't really use it. Up and down the one step to get to back deck. 11/23/22: Patient's wife Devere updated his goals today: Walk around store (gets distracted), swing a golf club again (thinking of driving range), get up on his own, walk without leaning forward, more recognition of the left side (from CVA), not need gait belt, OT-- be able to get dressed more on his own, navigate the newspaper.  OBJECTIVE:  TREATMENT: 01/10/24 Nustep level 5 x 8 minutes Bike level 5 x 6 minutes Seated ball bounces Standing ball bounce UBE level 4 x 4 minutes backwards hurts left shoulder Partial sit ups Gait outside 1/2 island rest and back CGA Then gait to car  01/03/24 Nustep level 5 x 8 minutes, cues to keep going and for bigger motions Bike level 4 x 6 minutes Partial sit ups 3# wate bar hitting ball Walking with HHA x 300 feet in side x 3 Side stepping, fwd and backward walking [employer] Gait out back door in grass to the car with HHA and  a lot of cues  12/29/23 NuStep L5 x63mins UBE L4 x68mins each way STS 2x10 Seated punches with 2# 2x10 Hitting ball with 3# WaTe Ball bouncing  Modified sit ups 2x10 Walk outside to car and  transfer into car  12/13/23 Nustep level 5 x 5 minutes UBE level 4 x 6 minutes Walking with hand hold assist a lot of cues for posture and step length we did 200 feet x 3 Sit to supine to sit multiple times working on him trying to do all on his own, did need verbal cues and to get all the way up the second time needed mod A Partial sit ups in sitting Ball bouncing  Walking direction changes  11/22/23 Nustep level 5 x 6 minutes Partial sit ups UBE level 4 x 5 minutes On airex reaching for numbers called out Standing ball toss Gait outside with HHA, a lot of cues and really Min/Mod A due to poor steps today  11/15/23 Nustep level 5 x 6 minutes UBE level 4 x 6 minutes In Pbars side stepping with tband on floor for step length help On airex reaching for numbers on wall 6 toe touches Standing ball toss Passive left shoulder ROM Red tband left shoulder ER/IR row and extension  11/10/23 UBE level 4 x 6 minutes Nustep level 5 x 6 minutes Partial sit ups  2.5# hip marches 2.5# LAQ Standing ball toss Standing ball kicks Side steps Gait with HHA outside out back door and around the the car  11/01/23 UBE level 4 x 6 minutes Nustep level 5 x 6 minutes Feet on ball K2C, rotation, small bridge, isometric abs Right hip extension blue tband supine Supine right hip abduction with sliding board Blue tband clamshells Bed mobility on his own with CGA Gait out the back door and then along the asphalt to his car at the front of the building with [facility]  10/27/23 Nustep level 5 x 6 minutes Bike level 5 x 5 minutes UBE level 4 x 5 minutes Seated partial sit ups Standing right arm 10# row/extension and then 5# left arm Standing with SPC 6 toe taps alternating Seated volley ball Seated 5# LAQ Seated elbow touches for core motions Passive left shoulder stretches   10/11/23 Nustep level 5 x 7 minutes Seated volley ball In pbars on airex reaching for numbers Partial sit ups Elbow touches  for trunk and core Gait around the back parking [place] with HHA, needed one long seated rest break, needed a lot of cues, on the second half of [address] he was tired and really dragging his feet with significant forward trunk lean.   Passive motions of the left shoulder  10/06/23 Bike level 4 x 5 minutes Nustep level 5 x 5 minutes 3# LAQ 3# marches Seated volley ball Passive stretch left shoulder Partial sit ups On airex reaching for numbers on wall, trying to get him to use the left arm as well Gait outside with HHA half way around the parking [place] a short break and then walking in the grass from the front of the building to the car  09/27/23 Nustep level 5 x 6 minutes Bike level 4 x 6 minutes Seated volleyball Standing volleyball Partial sit ups TUG 27 seconds but needed ModA due to a LOB, the second attempt he did in 19 seconds but uncontrolled sitting Supine bridges 2# flexion and chest press with wate bar Supine ball reaching Supine to sit with min A gait with him out to the  car   09/13/23 Nustep level 5 x 7 mintues Bike level 4 x 6 minutes Gait outside with HHA/CGA 250' x 2 Supine bridges Isometric abs Left leg abduction Supine to sitting with min A Seated volley ball Seated ball kicks  09/06/23 Home assessment with wife: Bathroom set up is very cramped and crowded.  Has over commode toilet and grab bars, they have a walker in front of the commode, the way it is set up he has to walk side ways into the set up and side ways out, I lowered the front legs and put them in the shower that is directly in front and had the back legs out, this gave him about 10 more to get b/n the toilet and the walker so he could walk in and then turn instead of side ways. Bed transfer from supine:  We did this 4 times, dropped the side rail, raised head of the bed and wife typically uses a strap that she gives Octaviano and he pulls up with it.  Today he got up without it twice, we talked about  protecting [name]/s back with leaning back and having a wide base of support, also to give Octaviano cues to lean forward as he tends to be retropulsive.   Shower:  Santina in and looked at this with Devere, gave some ideas as he is now holding grab bar and stepping over the tub to get in.  Asked about safer way of sitting and bringing legs over she said this was tried but they were unable to get his legs over. Went over HEP:  in his chair talked about reciprocal patterns and she looked on [place] for a pedal machine Gait: outside with HHA to the mail box and back cues for step length  08/25/23 Nustep level 5 x 6 minutes, cues for larger movements Bike level 4 x 6 minutes UBE level 3 x 5 mintues Worked on some bed mobility Bridges Partial sit ups Supine left leg sliding board hip abduction Seated volley ball with some cues to use the left hand Gait outside with hand hold assist and some cues for steps, some walking in grass   PATIENT EDUCATION: Education details: none new 02/16/23 Person educated: Patient and Spouse Education method: Explanation, Demonstration, Tactile cues, and Verbal cues Education comprehension: verbalized understanding, returned demonstration, verbal cues required, tactile cues required, and needs further education   ------------------------------------------------------------------------------------------------------- (Measures in this section from initial evaluation unless otherwise noted) DIAGNOSTIC FINDINGS:  MRI 2021 with degenerative changes in lumbar spine, L3-4 R subarticular stenosis COGNITION: Overall cognitive status: History of cognitive impairments - at baseline SENSATION: Not tested COORDINATION: Moderately impaired BLE MUSCLE LENGTH: Hamstrings: severely restricted B Thomas test: Severely restricted B. POSTURE: rounded shoulders, forward head, increased thoracic kyphosis, posterior pelvic tilt, and flexed trunk  LOWER EXTREMITY ROM:   BLE extremely stiff  throughout, limited hip ROM in all planes, B knees limited in extension. LOWER EXTREMITY MMT:  3+/5 throughout. Unable to determine accuracy of MMT due to cognition. Functionally demosntrates poor coordinated activation and poor muscular endurance. BED MOBILITY: Min A for Supine<> Sit. Patient was using a ladder in his hospital bed and could move MI. TRANSFERS: Min A, mod VC, tends to lean back.  11/02/22 independent with use of hands on arm chair with a couple of tries CURB: Min A-Has one step out to his deck. STAIRS: N/A  GAIT: Gait pattern: step to pattern, decreased arm swing- Right, decreased arm swing- Left, decreased step length- Right,  decreased step length- Left, shuffling, festinating, trunk flexed, narrow BOS, poor foot clearance- Right, and poor foot clearance- Left Distance walked: 55' Assistive device utilized: Environmental consultant - 2 wheeled Level of assistance: Occasional min A Comments: mod VC to stay close to walker, take longer steps. FUNCTIONAL TESTS:  5 times sit to stand: 38.23  09/29/22  could not performs 5XSTS, 11/02/22 = 47 seconds with cues to use arms, 07/21/23 really struggled today 55 seconds Timed up and go (TUG): 47.91 with RW, 07/21/22 TUG 29  seconds without device, 09/29/22 25 seconds, 11/02/22 TUG 20 seconds, 07/21/23 = 26 seconds no device SBA                                                                     PATIENT EDUCATION: Education details: Progress towards goals, POC Person educated: Patient and Spouse Education method: Programmer, multimedia, Facilities manager, and Verbal cues  Education comprehension: verbalized understanding, returned demonstration, verbal cues required, and needs further education  HOME EXERCISE PROGRAM: PWR! moves  GOALS: Goals reviewed with patient? Yes  SHORT TERM GOALS: Target date: 12/22/22  Perform HEP for PWR! Moves with cues from family. Baseline: Pt's wife, Devere, notes walking and hitting beach ball as main HEP. Goal status: GOAL MET, 12/20/2022    2. Decrease 5 x STS to < 35 sec to demonstrate improved functional strength  Baseline: 47 seconds on 5/21 per note; 34.5 sec UE support 12/20/2022  Goal status: GOAL MET, 12/20/2022   3. Perform sit <> stand transfers from average chair height with Supervision.  Baseline: CGA  12/20/2022; (01/10/23) CGA  Goal status: NOT MET  LONG TERM GOALS: Target date: 01/13/24  Patient will perform HEP progression with caregiver assist. Baseline: has minimal HEP Goal status: progressing  2.  Decrease 5 x STS to < 25 sec to demonstrate improved functional strength Baseline: 47 seconds on 5/21 per note; (01/10/23) 36 sec w/ cues for speed/sequence>30.38 sec with BUE supprot Goal status: PARTIALLY MET, 01/24/2023  3. Improve quality of gait to be able to ambulate x 100 feet with supervision. Baseline: CGA with cues  Goal status: Met 08/18/23  4.  Patient will perform his bed mobility with MI including rolling, sit<>supine, and scooting in bed. Baseline: on hard surface mat he is independent but on soft bed at home he struggles, wife reports only needs a hand to get up now 11/02/22; (01/10/23) modified indep.  Goal status: MET  5.  Patient will demonstrate floor<>stand with CGA and verbal cues for patient's wife to be able to assist. Baseline:  Has to call 9-1-1; +2 max assist floor>stand with UE support 01/19/2023 Goal status: GOAL NOT MET 01/19/2023  **UPDATED GOALS BELOW ** per recert 05/17/2023  SHORT TERM GOALS: Target date: 02/25/2023   Pt will perform HEP to maintain lower body strength, flexibility, posture with wife's assistance. Baseline:  Goal status: met 04/12/23  2. Pt will perform sit to stand, 8 of 10 reps with BUE support, with supervision, to maintain safety with transfers.  Baseline: min guard  Goal status: 02/07/23-CGA from elevated surface and max VC to lean forward, ongoing   LONG TERM GOALS: Target date: 07/18/23   Patient will perform HEP progression to maintain lower body strength,  flexibility, posture with wife's assistance.  Baseline:  Goal status: doing some walking with his wife when she feels safe, has done some pedaling at home as well 12/13/23  2.  Pt will ambulate 150-175 ft in 2 MWT with min guard assist, to preserve functional mobility within the home.  Baseline: Goal status:  met 08/25/23  3. Wife will verbalize tips for fall prevention education to minimize fall frequency Baseline:  Goal status:progressing and doing well 4.  Patient will be able to toilet without freezing for over a 4 week period met 11/15/23 5.  Patient will be able to get out of bed from supine with wife assist minimal Assist,met 10/06/23   ASSESSMENT:  CLINICAL IMPRESSION:  Patients wife reports that he has been having a lot of difficulty walking.  He did well for me especially outside much larger and smoother steps, inside he definitely shuffles and struggles and I feel it is because of distractions.  The current plan is absolutely the best for the patient and his wife as there is no way he could maintain this on his own and at home, she is trying to walk him to the mailbox on a daily basis.  She reports that he is doing much better getting out of bed, less help from her, he still needs CGA for walking and there are times that he shuffles and stumbles.   OBJECTIVE IMPAIRMENTS: Abnormal gait, decreased activity tolerance, decreased balance, decreased cognition, decreased coordination, decreased endurance, decreased mobility, difficulty walking, decreased ROM, decreased strength, decreased safety awareness, impaired flexibility, impaired UE functional use, improper body mechanics, and postural dysfunction.   PLAN:  PT FREQUENCY: 1x/week  PT DURATION: 12 weeks  PLANNED INTERVENTIONS: Therapeutic exercises, Therapeutic activity, Neuromuscular re-education, Balance training, Gait training, Patient/Family education, Self Care, Joint mobilization, Stair training, Moist heat, and Manual  therapy  PLAN FOR NEXT SESSION:   Will continue to work wiht patient to help him and his wife stay living at home and with less difficulty   Patient Details  Name: MEHUL RUDIN MRN: [id number] Date of Birth: 06-18-1940 Referring Provider:  [name], *  Ozell Mainland, PT

## 2024-01-12 ENCOUNTER — Other Ambulatory Visit (HOSPITAL_BASED_OUTPATIENT_CLINIC_OR_DEPARTMENT_OTHER): Payer: Self-pay

## 2024-01-16 NOTE — Therapy (Signed)
 OUTPATIENT PHYSICAL THERAPY NEURO TREATMENT  Patient Name: William [name] MRN: [id number] DOB:10-11-1940, 83 y.o., male Today's Date: 01/17/2024  PCP: [name]  DO REFERRING PROVIDER: same   END OF SESSION:  PT End of Session - 01/17/24 1501     Visit Number [id number]    Date for PT Re-Evaluation 01/25/24    Authorization Type HTA    PT Start Time 1502    PT Stop Time 1545    PT Time Calculation (min) 43 min    Equipment Utilized During Treatment Gait belt    Activity Tolerance Patient tolerated treatment well    Behavior During Therapy WFL for tasks assessed/performed               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   HTN (hypertension) 07/19/2015   Hyperlipidemia    Major neurocognitive disorder 01/09/2014   Mild, related to stroke history   Nocturia    Renal insufficiency 06/25/2013   S/P carotid endarterectomy    BILATERAL ICA--  PATENT PER DUPLEX  05-19-2012   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/26/2017   Right calf   Urinary frequency    Vitamin D  deficiency    Past Surgical History:  Procedure Laterality Date   APPENDECTOMY  AS CHILD   CARDIOVASCULAR STRESS TEST  03-27-2012  DR CRENSHAW   LOW RISK LEXISCAN  STUDY-- PROBABLE NORMAL PERFUSION AND SOFT TISSUE ATTENUATION/  NO ISCHEMIA/ EF 51%   CAROTID ENDARTERECTOMY Bilateral LEFT  11-12-2008  DR GREG HAYES   RIGHT ICA  2006  ([facility])   CHOLECYSTECTOMY N/A 02/23/2022   Procedure: LAPAROSCOPIC CHOLECYSTECTOMY;  Surgeon: [name], MD;  Location: [facility] OR;  Service:  General;  Laterality: N/A;   CYSTOSCOPY W/ RETROGRADES Bilateral 06/22/2021   Procedure: CYSTOSCOPY WITH RETROGRADE PYELOGRAM;  Surgeon: [name], MD;  Location: [facility];  Service: Urology;  Laterality: Bilateral;   CYSTOSCOPY WITH LITHOLAPAXY N/A 02/26/2013   Procedure: CYSTOSCOPY WITH LITHOLAPAXY;  Surgeon: Senior Matilda, MD;  Location: [facility];  Service: Urology;  Laterality: N/A;   ENDOSCOPIC RETROGRADE CHOLANGIOPANCREATOGRAPHY (ERCP) WITH PROPOFOL  N/A 02/22/2022   Procedure: ENDOSCOPIC RETROGRADE CHOLANGIOPANCREATOGRAPHY (ERCP) WITH PROPOFOL ;  Surgeon: [name], MD;  Location: [facility] ENDOSCOPY;  Service: Gastroenterology;  Laterality: N/A;   EYE SURGERY  Jan. 2016   cataract surgery both eyes   INGUINAL HERNIA REPAIR Right 11-08-2006   IR KYPHO EA ADDL LEVEL THORACIC OR LUMBAR  02/12/2021   IR RADIOLOGIST EVAL & MGMT  02/18/2021   MASS EXCISION N/A 03/03/2016   Procedure: EXCISION OF BACK  MASS;  Surgeon: [name], MD;  Location: [facility];  Service: General;  Laterality: N/A;   MOHS SURGERY Left 1/ 2016   [name]-- Basal cell   PROSTATE SURGERY     REMOVAL OF STONES  02/22/2022   Procedure: REMOVAL OF STONES;  Surgeon: [name], MD;  Location: [facility]COPY;  Service: Gastroenterology;;   [name]  02/22/2022  Procedure: SPHINCTEROTOMY;  Surgeon: [name], MD;  Location: [facility] ENDOSCOPY;  Service: Gastroenterology;;   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 N/A 06/22/2021   Procedure: TRANSURETHRAL RESECTION OF BLADDER TUMOR;  Surgeon: [name], MD;  Location: [facility];  Service: Urology;  Laterality: N/A;   TRANSURETHRAL RESECTION OF PROSTATE N/A 02/26/2013   Procedure: TRANSURETHRAL RESECTION OF THE PROSTATE WITH GYRUS INSTRUMENTS;  Surgeon: Senior Matilda, MD;  Location: [facility];  Service: Urology;  Laterality: N/A;   TRANSURETHRAL RESECTION OF  PROSTATE N/A 06/22/2021   Procedure: TRANSURETHRAL RESECTION OF THE PROSTATE (TURP);  Surgeon: [name], MD;  Location: [place];  Service: Urology;  Laterality: N/A;   Patient Active Problem List   Diagnosis Date Noted   Bronchitis 06/16/2023   Impacted cerumen of left ear 06/16/2023   Acute encephalopathy 04/18/2023   Parkinson's disease (HCC) 04/18/2023   History of CVA with residual deficit 04/18/2023   Chronic kidney disease, stage 3b (HCC) 04/18/2023   Dementia with behavioral disturbance (HCC) 04/18/2023   Anemia of chronic disease 04/18/2023   Laceration of right upper extremity 02/24/2023   Dermatitis 02/24/2023   Lung nodules 02/24/2023   Dysuria 08/03/2022   Nonintractable headache 07/01/2022   Bilateral impacted cerumen 06/24/2022   Rash 06/15/2022   Postoperative ileus (HCC) 02/27/2022   Ileus, postoperative (HCC) 02/26/2022   Choledocholithiasis 02/19/2022   DNR (do not resuscitate) 02/19/2022   Left elbow pain 01/26/2022   Mid back pain on left side 01/26/2022   Rib pain 01/26/2022   Acute pain of left shoulder 11/12/2021   Leukocytes in urine 11/12/2021   Urinary frequency 11/12/2021   Thrush 10/08/2021   Hemiplegia, 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   Insomnia    Prediabetes    Acute 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 (HCC)    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   Transaminitis 07/27/2021   UTI (urinary tract infection) 07/27/2021   CVA (cerebral vascular accident) (HCC) 07/27/2021   Fall 07/27/2021   Hyperglycemia 07/27/2021   Cholelithiasis 07/27/2021   Hypoxia 07/27/2021   Nausea and vomiting 07/27/2021   Acute metabolic encephalopathy 07/27/2021   Normocytic anemia 07/27/2021   Chronic back pain 07/27/2021   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   Closed fracture of first lumbar vertebra with routine healing 02/03/2021   Closed  fracture of multiple ribs 11/18/2020   Anxiety 01/29/2020   Leg pain, bilateral 01/29/2020   Ingrown toenail 07/13/2019   Lumbar spondylosis 05/02/2018   Pain in left knee 03/09/2018   Osteoarthritis of left hip 01/16/2018   Trochanteric bursitis of left hip 01/16/2018   Preventative health care 09/26/2017   HTN (hypertension) 07/19/2015   Hyperlipidemia 07/19/2015   Great toe pain 02/11/2014   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   Obesity (BMI 30-39.9) 06/25/2013   Renal insufficiency 06/25/2013   Weakness of left arm 06/25/2013   Sebaceous cyst 03/03/2011   Sprain of lumbar region 07/31/2010   Rib pain, left 08/29/2009   Carotid artery stenosis, asymptomatic, bilateral 05/02/2009   Eczema, atopic 05/31/2008   Vitamin D  deficiency 03/01/2008   BPH (benign prostatic hyperplasia) 08/06/2007   Fasting hyperglycemia 12/21/2006   History of right MCA infarct 2006    ONSET DATE: 06/29/22 REFERRING DIAG:  Diagnosis  I63.9 (ICD-10-CM) - Cerebrovascular accident (CVA), unspecified mechanism (HCC)  W19.XXXD (ICD-10-CM) - Fall, subsequent encounter    THERAPY DIAG:  Muscle weakness (  generalized)  Other abnormalities of gait and mobility  Hemiplegia and hemiparesis following cerebral infarction affecting left non-dominant side (HCC)  Difficulty in walking, not elsewhere classified  Cognitive communication deficit  Repeated falls  Rationale for Evaluation and Treatment: Rehabilitation  SUBJECTIVE:                                                                                                                                                                                         SUBJECTIVE STATEMENT:  Patient enters in a wheelchair, son reports there was a lot going on, he had to use the bathroom and he tried getting up on his own, etc. Also it was raining so he brought the chair to be safe.   PERTINENT HISTORY:  GEARY RUFO is a 83 year old man with dementia,  CKD, HTN, CAD, HLD and history of CVA  (2006, 2023), Parkinson's  PAIN:  Are you having pain? Faces/behavior scale- mild pain BLEs  and back   PRECAUTIONS: Fall  WEIGHT BEARING RESTRICTIONS: No  FALLS: Has patient fallen in last 6 months? No  LIVING ENVIRONMENT: Lives with: lives with their family and lives with their spouse Lives in: House/apartment Stairs: 1 brick high step Has following equipment at home: Environmental consultant - 2 wheeled, Wheelchair (manual), Shower bench, bed side commode, Grab bars, and hospital bed, sliding pad for car  PLOF: Independent with basic ADLs and Independent with household mobility without device  PATIENT GOALS: Walk around the block with his wife. Be able to use the dining room chair rather than have to use the W/C. Increased I with bed mobility and simple tasks at home like make some coffee or brush his teeth, Step over tub to shower, has bench, but he can't really use it. Up and down the one step to get to back deck. 11/23/22: Patient's wife Devere updated his goals today: Walk around store (gets distracted), swing a golf club again (thinking of driving range), get up on his own, walk without leaning forward, more recognition of the left side (from CVA), not need gait belt, OT-- be able to get dressed more on his own, navigate the newspaper.  OBJECTIVE:  TREATMENT: 01/17/24 NuStep L5x38mins UBE L2 x22mins each way   Alternating punches 2# 20 reps alt x2 Chest press 3# WaTE 2x15 Standing 2# shoulder flexion alternating  Ball bouncing and hitting while seated  Modified sit ups 2x10   01/10/24 Nustep level 5 x 8 minutes Bike level 5 x 6 minutes Seated ball bounces Standing ball bounce UBE level 4 x 4 minutes backwards hurts left shoulder Partial sit ups Gait outside 1/2 island rest and back  CGA Then gait to car  01/03/24 Nustep level 5 x 8 minutes, cues to keep going and for bigger motions Bike level 4 x 6 minutes Partial sit ups 3# wate bar hitting  ball Walking with HHA x 300 feet in side x 3 Side stepping, fwd and backward walking Ball bounce Ball kicks Gait out back door in grass to the car with HHA and a lot of cues  12/29/23 NuStep L5 x78mins UBE L4 x41mins each way STS 2x10 Seated punches with 2# 2x10 Hitting ball with 3# WaTe Ball bouncing  Modified sit ups 2x10 Walk outside to car and transfer into car  12/13/23 Nustep level 5 x 5 minutes UBE level 4 x 6 minutes Walking with hand hold assist a lot of cues for posture and step length we did 200 feet x 3 Sit to supine to sit multiple times working on him trying to do all on his own, did need verbal cues and to get all the way up the second time needed mod A Partial sit ups in sitting Ball bouncing  Walking direction changes  11/22/23 Nustep level 5 x 6 minutes Partial sit ups UBE level 4 x 5 minutes On airex reaching for numbers called out Standing ball toss Gait outside with HHA, a lot of cues and really Min/Mod A due to poor steps today  11/15/23 Nustep level 5 x 6 minutes UBE level 4 x 6 minutes In Pbars side stepping with tband on floor for step length help On airex reaching for numbers on wall 6 toe touches Standing ball toss Passive left shoulder ROM Red tband left shoulder ER/IR row and extension  11/10/23 UBE level 4 x 6 minutes Nustep level 5 x 6 minutes Partial sit ups  2.5# hip marches 2.5# LAQ Standing ball toss Standing ball kicks Side steps Gait with HHA outside out back door and around the the car  11/01/23 UBE level 4 x 6 minutes Nustep level 5 x 6 minutes Feet on ball K2C, rotation, small bridge, isometric abs Right hip extension blue tband supine Supine right hip abduction with sliding board Blue tband clamshells Bed mobility on his own with CGA Gait out the back door and then along the asphalt to his car at the front of the building with [facility]  10/27/23 Nustep level 5 x 6 minutes Bike level 5 x 5 minutes UBE level 4 x 5  minutes Seated partial sit ups Standing right arm 10# row/extension and then 5# left arm Standing with SPC 6 toe taps alternating Seated volley ball Seated 5# LAQ Seated elbow touches for core motions Passive left shoulder stretches   10/11/23 Nustep level 5 x 7 minutes Seated volley ball In pbars on airex reaching for numbers Partial sit ups Elbow touches for trunk and core Gait around the back parking [place] with HHA, needed one long seated rest break, needed a lot of cues, on the second half of [address] he was tired and really dragging his feet with significant forward trunk lean.   Passive motions of the left shoulder  10/06/23 Bike level 4 x 5 minutes Nustep level 5 x 5 minutes 3# LAQ 3# marches Seated volley ball Passive stretch left shoulder Partial sit ups On airex reaching for numbers on wall, trying to get him to use the left arm as well Gait outside with HHA half way around the parking [place] a short break and then walking in the grass from the front of the building to the car  09/27/23 Nustep level 5 x 6 minutes Bike level 4 x 6 minutes Seated volleyball Standing volleyball Partial sit ups TUG 27 seconds but needed ModA due to a LOB, the second attempt he did in 19 seconds but uncontrolled sitting Supine bridges 2# flexion and chest press with wate bar Supine ball reaching Supine to sit with min A gait with him out to the car   09/13/23 Nustep level 5 x 7 mintues Bike level 4 x 6 minutes Gait outside with HHA/CGA 250' x 2 Supine bridges Isometric abs Left leg abduction Supine to sitting with min A Seated volley ball Seated ball kicks  09/06/23 Home assessment with wife: Bathroom set up is very cramped and crowded.  Has over commode toilet and grab bars, they have a walker in front of the commode, the way it is set up he has to walk side ways into the set up and side ways out, I lowered the front legs and put them in the shower that is directly in front  and had the back legs out, this gave him about 10 more to get b/n the toilet and the walker so he could walk in and then turn instead of side ways. Bed transfer from supine:  We did this 4 times, dropped the side rail, raised head of the bed and wife typically uses a strap that she gives Octaviano and he pulls up with it.  Today he got up without it twice, we talked about protecting [name]/s back with leaning back and having a wide base of support, also to give Octaviano cues to lean forward as he tends to be retropulsive.   Shower:  Santina in and looked at this with Devere, gave some ideas as he is now holding grab bar and stepping over the tub to get in.  Asked about safer way of sitting and bringing legs over she said this was tried but they were unable to get his legs over. Went over HEP:  in his chair talked about reciprocal patterns and she looked on [place] for a pedal machine Gait: outside with HHA to the mail box and back cues for step length  08/25/23 Nustep level 5 x 6 minutes, cues for larger movements Bike level 4 x 6 minutes UBE level 3 x 5 mintues Worked on some bed mobility Bridges Partial sit ups Supine left leg sliding board hip abduction Seated volley ball with some cues to use the left hand Gait outside with hand hold assist and some cues for steps, some walking in grass   PATIENT EDUCATION: Education details: none new 02/16/23 Person educated: Patient and Spouse Education method: Explanation, Demonstration, Tactile cues, and Verbal cues Education comprehension: verbalized understanding, returned demonstration, verbal cues required, tactile cues required, and needs further education   ------------------------------------------------------------------------------------------------------- (Measures in this section from initial evaluation unless otherwise noted) DIAGNOSTIC FINDINGS:  MRI 2021 with degenerative changes in lumbar spine, L3-4 R subarticular stenosis COGNITION: Overall  cognitive status: History of cognitive impairments - at baseline SENSATION: Not tested COORDINATION: Moderately impaired BLE MUSCLE LENGTH: Hamstrings: severely restricted B Thomas test: Severely restricted B. POSTURE: rounded shoulders, forward head, increased thoracic kyphosis, posterior pelvic tilt, and flexed trunk  LOWER EXTREMITY ROM:   BLE extremely stiff throughout, limited hip ROM in all planes, B knees limited in extension. LOWER EXTREMITY MMT:  3+/5 throughout. Unable to determine accuracy of MMT due to cognition. Functionally demosntrates poor coordinated activation and poor muscular endurance. BED MOBILITY: Min A for Supine<> Sit. Patient  was using a ladder in his hospital bed and could move MI. TRANSFERS: Min A, mod VC, tends to lean back.  11/02/22 independent with use of hands on arm chair with a couple of tries CURB: Min A-Has one step out to his deck. STAIRS: N/A  GAIT: Gait pattern: step to pattern, decreased arm swing- Right, decreased arm swing- Left, decreased step length- Right, decreased step length- Left, shuffling, festinating, trunk flexed, narrow BOS, poor foot clearance- Right, and poor foot clearance- Left Distance walked: 57' Assistive device utilized: Environmental consultant - 2 wheeled Level of assistance: Occasional min A Comments: mod VC to stay close to walker, take longer steps. FUNCTIONAL TESTS:  5 times sit to stand: 38.23  09/29/22  could not performs 5XSTS, 11/02/22 = 47 seconds with cues to use arms, 07/21/23 really struggled today 55 seconds Timed up and go (TUG): 47.91 with RW, 07/21/22 TUG 29  seconds without device, 09/29/22 25 seconds, 11/02/22 TUG 20 seconds, 07/21/23 = 26 seconds no device SBA                                                                     PATIENT EDUCATION: Education details: Progress towards goals, POC Person educated: Patient and Spouse Education method: Programmer, multimedia, Facilities manager, and Verbal cues  Education comprehension: verbalized  understanding, returned demonstration, verbal cues required, and needs further education  HOME EXERCISE PROGRAM: PWR! moves  GOALS: Goals reviewed with patient? Yes  SHORT TERM GOALS: Target date: 12/22/22  Perform HEP for PWR! Moves with cues from family. Baseline: Pt's wife, Devere, notes walking and hitting beach ball as main HEP. Goal status: GOAL MET, 12/20/2022   2. Decrease 5 x STS to < 35 sec to demonstrate improved functional strength  Baseline: 47 seconds on 5/21 per note; 34.5 sec UE support 12/20/2022  Goal status: GOAL MET, 12/20/2022   3. Perform sit <> stand transfers from average chair height with Supervision.  Baseline: CGA  12/20/2022; (01/10/23) CGA  Goal status: NOT MET  LONG TERM GOALS: Target date: 01/13/24  Patient will perform HEP progression with caregiver assist. Baseline: has minimal HEP Goal status: progressing  2.  Decrease 5 x STS to < 25 sec to demonstrate improved functional strength Baseline: 47 seconds on 5/21 per note; (01/10/23) 36 sec w/ cues for speed/sequence>30.38 sec with BUE supprot Goal status: PARTIALLY MET, 01/24/2023  3. Improve quality of gait to be able to ambulate x 100 feet with supervision. Baseline: CGA with cues  Goal status: Met 08/18/23  4.  Patient will perform his bed mobility with MI including rolling, sit<>supine, and scooting in bed. Baseline: on hard surface mat he is independent but on soft bed at home he struggles, wife reports only needs a hand to get up now 11/02/22; (01/10/23) modified indep.  Goal status: MET  5.  Patient will demonstrate floor<>stand with CGA and verbal cues for patient's wife to be able to assist. Baseline:  Has to call 9-1-1; +2 max assist floor>stand with UE support 01/19/2023 Goal status: GOAL NOT MET 01/19/2023  **UPDATED GOALS BELOW ** per recert 05/17/2023  SHORT TERM GOALS: Target date: 01/25/24   Pt will perform HEP to maintain lower body strength, flexibility, posture with wife's  assistance. Baseline:  Goal status: met  04/12/23  2. Pt will perform sit to stand, 8 of 10 reps with BUE support, with supervision, to maintain safety with transfers.  Baseline: min guard  Goal status: 02/07/23-CGA from elevated surface and max VC to lean forward, ongoing   LONG TERM GOALS: Target date: 01/25/24   Patient will perform HEP progression to maintain lower body strength, flexibility, posture with wife's assistance. Baseline:  Goal status: doing some walking with his wife when she feels safe, has done some pedaling at home as well 12/13/23  2.  Pt will ambulate 150-175 ft in 2 MWT with min guard assist, to preserve functional mobility within the home.  Baseline: Goal status:  met 08/25/23  3. Wife will verbalize tips for fall prevention education to minimize fall frequency Baseline:  Goal status:progressing and doing well 4.  Patient will be able to toilet without freezing for over a 4 week period met 11/15/23 5.  Patient will be able to get out of bed from supine with wife assist minimal Assist,met 10/06/23   ASSESSMENT:  CLINICAL IMPRESSION:  Patients seems to be having a lot more difficulty walking. He is shuffling his feet more than usual and needs more cues to pick them up. He does better with exercises with targets for example with punches. Sit to stands from any surfaces, especially lower ones are still difficult for him to initiate movement.    OBJECTIVE IMPAIRMENTS: Abnormal gait, decreased activity tolerance, decreased balance, decreased cognition, decreased coordination, decreased endurance, decreased mobility, difficulty walking, decreased ROM, decreased strength, decreased safety awareness, impaired flexibility, impaired UE functional use, improper body mechanics, and postural dysfunction.   PLAN:  PT FREQUENCY: 1x/week  PT DURATION: 12 weeks  PLANNED INTERVENTIONS: Therapeutic exercises, Therapeutic activity, Neuromuscular re-education, Balance training, Gait  training, Patient/Family education, Self Care, Joint mobilization, Stair training, Moist heat, and Manual therapy  PLAN FOR NEXT SESSION:   Will continue to work wiht patient to help him and his wife stay living at home and with less difficulty   Patient Details  Name: William [name] MRN: [id number] Date of Birth: 12/17/1940 Referring Provider:  Antonio Meth, Jamee SAUNDERS, *

## 2024-01-17 ENCOUNTER — Ambulatory Visit: Attending: Family Medicine

## 2024-01-17 DIAGNOSIS — R296 Repeated falls: Secondary | ICD-10-CM | POA: Insufficient documentation

## 2024-01-17 DIAGNOSIS — R278 Other lack of coordination: Secondary | ICD-10-CM | POA: Insufficient documentation

## 2024-01-17 DIAGNOSIS — R41841 Cognitive communication deficit: Secondary | ICD-10-CM | POA: Insufficient documentation

## 2024-01-17 DIAGNOSIS — Z9181 History of falling: Secondary | ICD-10-CM | POA: Diagnosis not present

## 2024-01-17 DIAGNOSIS — M25512 Pain in left shoulder: Secondary | ICD-10-CM | POA: Insufficient documentation

## 2024-01-17 DIAGNOSIS — R2689 Other abnormalities of gait and mobility: Secondary | ICD-10-CM | POA: Diagnosis not present

## 2024-01-17 DIAGNOSIS — R262 Difficulty in walking, not elsewhere classified: Secondary | ICD-10-CM | POA: Insufficient documentation

## 2024-01-17 DIAGNOSIS — M6281 Muscle weakness (generalized): Secondary | ICD-10-CM | POA: Diagnosis not present

## 2024-01-17 DIAGNOSIS — I69354 Hemiplegia and hemiparesis following cerebral infarction affecting left non-dominant side: Secondary | ICD-10-CM | POA: Insufficient documentation

## 2024-01-26 ENCOUNTER — Encounter: Payer: Self-pay | Admitting: Physical Therapy

## 2024-01-26 ENCOUNTER — Ambulatory Visit: Admitting: Physical Therapy

## 2024-01-26 DIAGNOSIS — R2689 Other abnormalities of gait and mobility: Secondary | ICD-10-CM

## 2024-01-26 DIAGNOSIS — M6281 Muscle weakness (generalized): Secondary | ICD-10-CM | POA: Diagnosis not present

## 2024-01-26 DIAGNOSIS — M25512 Pain in left shoulder: Secondary | ICD-10-CM

## 2024-01-26 DIAGNOSIS — I69354 Hemiplegia and hemiparesis following cerebral infarction affecting left non-dominant side: Secondary | ICD-10-CM

## 2024-01-26 DIAGNOSIS — R296 Repeated falls: Secondary | ICD-10-CM

## 2024-01-26 DIAGNOSIS — R262 Difficulty in walking, not elsewhere classified: Secondary | ICD-10-CM

## 2024-01-26 NOTE — Therapy (Signed)
 OUTPATIENT PHYSICAL THERAPY NEURO TREATMENT  Patient Name: William [name] MRN: [id number] DOB:[birth year], 83 y.o., male Today's Date: 01/26/2024  PCP: [name]  DO REFERRING PROVIDER: same   END OF SESSION:  PT End of Session - 01/26/24 1531     Visit Number [id number]    Date for PT Re-Evaluation 02/26/24    Authorization Type HTA    PT Start Time 1528    PT Stop Time 1612    PT Time Calculation (min) 44 min    Equipment Utilized During Treatment Gait belt    Activity Tolerance Patient tolerated treatment well    Behavior During Therapy WFL for tasks assessed/performed               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   HTN (hypertension) 07/19/2015   Hyperlipidemia    Major neurocognitive disorder 01/09/2014   Mild, related to stroke history   Nocturia    Renal insufficiency 06/25/2013   S/P carotid endarterectomy    BILATERAL ICA--  PATENT PER DUPLEX  05-19-2012   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/26/2017   Right calf   Urinary frequency    Vitamin D  deficiency    Past Surgical History:  Procedure Laterality Date   APPENDECTOMY  AS CHILD   CARDIOVASCULAR STRESS TEST  03-27-2012  DR CRENSHAW   LOW RISK LEXISCAN  STUDY-- PROBABLE NORMAL PERFUSION AND SOFT TISSUE ATTENUATION/  NO ISCHEMIA/ EF 51%   CAROTID ENDARTERECTOMY Bilateral LEFT  11-12-2008  DR GREG HAYES   RIGHT ICA  2006  ([facility])   CHOLECYSTECTOMY N/A 02/23/2022   Procedure: LAPAROSCOPIC CHOLECYSTECTOMY;  Surgeon: [name], MD;  Location: [facility] OR;  Service:  General;  Laterality: N/A;   CYSTOSCOPY W/ RETROGRADES Bilateral 06/22/2021   Procedure: CYSTOSCOPY WITH RETROGRADE PYELOGRAM;  Surgeon: [name], MD;  Location: [facility];  Service: Urology;  Laterality: Bilateral;   CYSTOSCOPY WITH LITHOLAPAXY N/A 02/26/2013   Procedure: CYSTOSCOPY WITH LITHOLAPAXY;  Surgeon: Senior Matilda, MD;  Location: [facility];  Service: Urology;  Laterality: N/A;   ENDOSCOPIC RETROGRADE CHOLANGIOPANCREATOGRAPHY (ERCP) WITH PROPOFOL  N/A 02/22/2022   Procedure: ENDOSCOPIC RETROGRADE CHOLANGIOPANCREATOGRAPHY (ERCP) WITH PROPOFOL ;  Surgeon: [name], MD;  Location: [facility] ENDOSCOPY;  Service: Gastroenterology;  Laterality: N/A;   EYE SURGERY  Jan. 2016   cataract surgery both eyes   INGUINAL HERNIA REPAIR Right 11-08-2006   IR KYPHO EA ADDL LEVEL THORACIC OR LUMBAR  02/12/2021   IR RADIOLOGIST EVAL & MGMT  02/18/2021   MASS EXCISION N/A 03/03/2016   Procedure: EXCISION OF BACK  MASS;  Surgeon: [name], MD;  Location: [facility];  Service: General;  Laterality: N/A;   MOHS SURGERY Left 1/ 2016   [name]-- Basal cell   PROSTATE SURGERY     REMOVAL OF STONES  02/22/2022   Procedure: REMOVAL OF STONES;  Surgeon: [name], MD;  Location: [facility] ENDOSCOPY;  Service: Gastroenterology;;   [name]  02/22/2022  Procedure: SPHINCTEROTOMY;  Surgeon: [name], MD;  Location: [facility] ENDOSCOPY;  Service: Gastroenterology;;   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 N/A 06/22/2021   Procedure: TRANSURETHRAL RESECTION OF BLADDER TUMOR;  Surgeon: [name], MD;  Location: [facility];  Service: Urology;  Laterality: N/A;   TRANSURETHRAL RESECTION OF PROSTATE N/A 02/26/2013   Procedure: TRANSURETHRAL RESECTION OF THE PROSTATE WITH GYRUS INSTRUMENTS;  Surgeon: Senior Matilda, MD;  Location: [facility];  Service: Urology;  Laterality: N/A;   TRANSURETHRAL RESECTION OF  PROSTATE N/A 06/22/2021   Procedure: TRANSURETHRAL RESECTION OF THE PROSTATE (TURP);  Surgeon: [name], MD;  Location: [facility];  Service: Urology;  Laterality: N/A;   Patient Active Problem List   Diagnosis Date Noted   Bronchitis 06/16/2023   Impacted cerumen of left ear 06/16/2023   Acute encephalopathy 04/18/2023   Parkinson's disease (HCC) 04/18/2023   History of CVA with residual deficit 04/18/2023   Chronic kidney disease, stage 3b (HCC) 04/18/2023   Dementia with behavioral disturbance (HCC) 04/18/2023   Anemia of chronic disease 04/18/2023   Laceration of right upper extremity 02/24/2023   Dermatitis 02/24/2023   Lung nodules 02/24/2023   Dysuria 08/03/2022   Nonintractable headache 07/01/2022   Bilateral impacted cerumen 06/24/2022   Rash 06/15/2022   Postoperative ileus (HCC) 02/27/2022   Ileus, postoperative (HCC) 02/26/2022   Choledocholithiasis 02/19/2022   DNR (do not resuscitate) 02/19/2022   Left elbow pain 01/26/2022   Mid back pain on left side 01/26/2022   Rib pain 01/26/2022   Acute pain of left shoulder 11/12/2021   Leukocytes in urine 11/12/2021   Urinary frequency 11/12/2021   Thrush 10/08/2021   Hemiplegia, 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   Insomnia    Prediabetes    Acute 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 (HCC)    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   Transaminitis 07/27/2021   UTI (urinary tract infection) 07/27/2021   CVA (cerebral vascular accident) (HCC) 07/27/2021   Fall 07/27/2021   Hyperglycemia 07/27/2021   Cholelithiasis 07/27/2021   Hypoxia 07/27/2021   Nausea and vomiting 07/27/2021   Acute metabolic encephalopathy 07/27/2021   Normocytic anemia 07/27/2021   Chronic back pain 07/27/2021   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   Closed fracture of first lumbar vertebra with routine healing 02/03/2021   Closed  fracture of multiple ribs 11/18/2020   Anxiety 01/29/2020   Leg pain, bilateral 01/29/2020   Ingrown toenail 07/13/2019   Lumbar spondylosis 05/02/2018   Pain in left knee 03/09/2018   Osteoarthritis of left hip 01/16/2018   Trochanteric bursitis of left hip 01/16/2018   Preventative health care 09/26/2017   HTN (hypertension) 07/19/2015   Hyperlipidemia 07/19/2015   Great toe pain 02/11/2014   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   Obesity (BMI 30-39.9) 06/25/2013   Renal insufficiency 06/25/2013   Weakness of left arm 06/25/2013   Sebaceous cyst 03/03/2011   Sprain of lumbar region 07/31/2010   Rib pain, left 08/29/2009   Carotid artery stenosis, asymptomatic, bilateral 05/02/2009   Eczema, atopic 05/31/2008   Vitamin D  deficiency 03/01/2008   BPH (benign prostatic hyperplasia) 08/06/2007   Fasting hyperglycemia 12/21/2006   History of right MCA infarct 2006    ONSET DATE: 06/29/22 REFERRING DIAG:  Diagnosis  I63.9 (ICD-10-CM) - Cerebrovascular accident (CVA), unspecified mechanism (HCC)  W19.XXXD (ICD-10-CM) - Fall, subsequent encounter    THERAPY DIAG:  Muscle weakness (  generalized)  Other abnormalities of gait and mobility  Hemiplegia and hemiparesis following cerebral infarction affecting left non-dominant side (HCC)  Difficulty in walking, not elsewhere classified  Repeated falls  Acute pain of left shoulder  Rationale for Evaluation and Treatment: Rehabilitation  SUBJECTIVE:                                                                                                                                                                                         SUBJECTIVE STATEMENT:  Doing okay, wife was out of town for a while and she feels that he did not walk much, reports that he has been having trouble walking but doing better today reports that he has been c/oing of the left shoulder pain and is going to make an appointment  PERTINENT HISTORY:   ELEANOR DIMICHELE is a 83 year old man with dementia, CKD, HTN, CAD, HLD and history of CVA  (2006, 2023), Parkinson's  PAIN:  Are you having pain? Faces/behavior scale- mild pain BLEs  and back   PRECAUTIONS: Fall  WEIGHT BEARING RESTRICTIONS: No  FALLS: Has patient fallen in last 6 months? No  LIVING ENVIRONMENT: Lives with: lives with their family and lives with their spouse Lives in: House/apartment Stairs: 1 brick high step Has following equipment at home: Environmental consultant - 2 wheeled, Wheelchair (manual), Shower bench, bed side commode, Grab bars, and hospital bed, sliding pad for car  PLOF: Independent with basic ADLs and Independent with household mobility without device  PATIENT GOALS: Walk around the block with his wife. Be able to use the dining room chair rather than have to use the W/C. Increased I with bed mobility and simple tasks at home like make some coffee or brush his teeth, Step over tub to shower, has bench, but he can't really use it. Up and down the one step to get to back deck. 11/23/22: Patient's wife Devere updated his goals today: Walk around store (gets distracted), swing a golf club again (thinking of driving range), get up on his own, walk without leaning forward, more recognition of the left side (from CVA), not need gait belt, OT-- be able to get dressed more on his own, navigate the newspaper.  OBJECTIVE:  TREATMENT: 01/26/24 Nustep level 5 x 8 minutes Bike level 4 x 5 minutes some verbal and tactile cues to keep upright [employer] kicks Side stepping Backward walking 200 feet walking x 2   01/17/24 NuStep L5x19mins UBE L2 x46mins each way   Alternating punches 2# 20 reps alt x2 Chest press 3# WaTE 2x15 Standing 2# shoulder flexion alternating  Ball bouncing and hitting while seated  Modified sit ups 2x10   01/10/24 Nustep level 5 x 8 minutes Bike level 5 x 6 minutes Seated ball bounces Standing ball bounce UBE level 4 x 4 minutes backwards  hurts left shoulder Partial sit ups Gait outside 1/2 island rest and back CGA Then gait to car  01/03/24 Nustep level 5 x 8 minutes, cues to keep going and for bigger motions Bike level 4 x 6 minutes Partial sit ups 3# wate bar hitting ball Walking with HHA x 300 feet in side x 3 Side stepping, fwd and backward walking [employer] kicks Gait out back door in grass to the car with HHA and a lot of cues  12/29/23 NuStep L5 x12mins UBE L4 x43mins each way STS 2x10 Seated punches with 2# 2x10 Hitting ball with 3# WaTe Ball bouncing  Modified sit ups 2x10 Walk outside to car and transfer into car  12/13/23 Nustep level 5 x 5 minutes UBE level 4 x 6 minutes Walking with hand hold assist a lot of cues for posture and step length we did 200 feet x 3 Sit to supine to sit multiple times working on him trying to do all on his own, did need verbal cues and to get all the way up the second time needed mod A Partial sit ups in sitting Ball bouncing  Walking direction changes  11/22/23 Nustep level 5 x 6 minutes Partial sit ups UBE level 4 x 5 minutes On airex reaching for numbers called out Standing ball toss Gait outside with HHA, a lot of cues and really Min/Mod A due to poor steps today  11/15/23 Nustep level 5 x 6 minutes UBE level 4 x 6 minutes In Pbars side stepping with tband on floor for step length help On airex reaching for numbers on wall 6 toe touches Standing ball toss Passive left shoulder ROM Red tband left shoulder ER/IR row and extension  11/10/23 UBE level 4 x 6 minutes Nustep level 5 x 6 minutes Partial sit ups  2.5# hip marches 2.5# LAQ Standing ball toss Standing ball kicks Side steps Gait with HHA outside out back door and around the the car  11/01/23 UBE level 4 x 6 minutes Nustep level 5 x 6 minutes Feet on ball K2C, rotation, small bridge, isometric abs Right hip extension blue tband supine Supine right hip abduction with sliding board Blue  tband clamshells Bed mobility on his own with CGA Gait out the back door and then along the asphalt to his car at the front of the building with [facility]  10/27/23 Nustep level 5 x 6 minutes Bike level 5 x 5 minutes UBE level 4 x 5 minutes Seated partial sit ups Standing right arm 10# row/extension and then 5# left arm Standing with SPC 6 toe taps alternating Seated volley ball Seated 5# LAQ Seated elbow touches for core motions Passive left shoulder stretches   10/11/23 Nustep level 5 x 7 minutes Seated volley ball In pbars on airex reaching for numbers Partial sit ups Elbow touches for trunk and core Gait around the back parking [place] with HHA, needed one long seated rest break, needed a lot of cues, on the second half of [address] he was tired and really dragging his feet with significant forward trunk lean.   Passive motions of the left shoulder 09/06/23 Home assessment with wife: Bathroom set up is very cramped and crowded.  Has over commode toilet and grab bars, they have a walker in front of the  commode, the way it is set up he has to walk side ways into the set up and side ways out, I lowered the front legs and put them in the shower that is directly in front and had the back legs out, this gave him about 10 more to get b/n the toilet and the walker so he could walk in and then turn instead of side ways. Bed transfer from supine:  We did this 4 times, dropped the side rail, raised head of the bed and wife typically uses a strap that she gives Octaviano and he pulls up with it.  Today he got up without it twice, we talked about protecting [name]/s back with leaning back and having a wide base of support, also to give Octaviano cues to lean forward as he tends to be retropulsive.   Shower:  Santina in and looked at this with Devere, gave some ideas as he is now holding grab bar and stepping over the tub to get in.  Asked about safer way of sitting and bringing legs over she said this was tried but they  were unable to get his legs over. Went over HEP:  in his chair talked about reciprocal patterns and she looked on [employer] for a pedal machine Gait: outside with HHA to the mail box and back cues for step length  PATIENT EDUCATION: Education details: none new 02/16/23 Person educated: Patient and Spouse Education method: Explanation, Demonstration, Tactile cues, and Verbal cues Education comprehension: verbalized understanding, returned demonstration, verbal cues required, tactile cues required, and needs further education   ------------------------------------------------------------------------------------------------------- (Measures in this section from initial evaluation unless otherwise noted) DIAGNOSTIC FINDINGS:  MRI 2021 with degenerative changes in lumbar spine, L3-4 R subarticular stenosis COGNITION: Overall cognitive status: History of cognitive impairments - at baseline SENSATION: Not tested COORDINATION: Moderately impaired BLE MUSCLE LENGTH: Hamstrings: severely restricted B Thomas test: Severely restricted B. POSTURE: rounded shoulders, forward head, increased thoracic kyphosis, posterior pelvic tilt, and flexed trunk  LOWER EXTREMITY ROM:   BLE extremely stiff throughout, limited hip ROM in all planes, B knees limited in extension. LOWER EXTREMITY MMT:  3+/5 throughout. Unable to determine accuracy of MMT due to cognition. Functionally demosntrates poor coordinated activation and poor muscular endurance. BED MOBILITY: Min A for Supine<> Sit. Patient was using a ladder in his hospital bed and could move MI. TRANSFERS: Min A, mod VC, tends to lean back.  11/02/22 independent with use of hands on arm chair with a couple of tries CURB: Min A-Has one step out to his deck. STAIRS: N/A  GAIT: Gait pattern: step to pattern, decreased arm swing- Right, decreased arm swing- Left, decreased step length- Right, decreased step length- Left, shuffling, festinating, trunk flexed, narrow  BOS, poor foot clearance- Right, and poor foot clearance- Left Distance walked: 32' Assistive device utilized: Environmental consultant - 2 wheeled Level of assistance: Occasional min A Comments: mod VC to stay close to walker, take longer steps. FUNCTIONAL TESTS:  5 times sit to stand: 38.23  09/29/22  could not performs 5XSTS, 11/02/22 = 47 seconds with cues to use arms, 07/21/23 really struggled today 55 seconds Timed up and go (TUG): 47.91 with RW, 07/21/22 TUG 29  seconds without device, 09/29/22 25 seconds, 11/02/22 TUG 20 seconds, 07/21/23 = 26 seconds no device SBA  PATIENT EDUCATION: Education details: Progress towards goals, POC Person educated: Patient and Spouse Education method: Explanation, Demonstration, and Verbal cues  Education comprehension: verbalized understanding, returned demonstration, verbal cues required, and needs further education  HOME EXERCISE PROGRAM: PWR! moves  GOALS: Goals reviewed with patient? Yes  SHORT TERM GOALS: Target date: 12/22/22  Perform HEP for PWR! Moves with cues from family. Baseline: Pt's wife, Devere, notes walking and hitting beach ball as main HEP. Goal status: GOAL MET, 12/20/2022   2. Decrease 5 x STS to < 35 sec to demonstrate improved functional strength  Baseline: 47 seconds on 5/21 per note; 34.5 sec UE support 12/20/2022  Goal status: GOAL MET, 12/20/2022   3. Perform sit <> stand transfers from average chair height with Supervision.  Baseline: CGA  12/20/2022; (01/10/23) CGA  Goal status: NOT MET  LONG TERM GOALS: Target date: 01/13/24  Patient will perform HEP progression with caregiver assist. Baseline: has minimal HEP Goal status: progressing  2.  Decrease 5 x STS to < 25 sec to demonstrate improved functional strength Baseline: 47 seconds on 5/21 per note; (01/10/23) 36 sec w/ cues for speed/sequence>30.38 sec with BUE supprot Goal status: PARTIALLY MET, 01/24/2023  3. Improve  quality of gait to be able to ambulate x 100 feet with supervision. Baseline: CGA with cues  Goal status: Met 08/18/23  4.  Patient will perform his bed mobility with MI including rolling, sit<>supine, and scooting in bed. Baseline: on hard surface mat he is independent but on soft bed at home he struggles, wife reports only needs a hand to get up now 11/02/22; (01/10/23) modified indep.  Goal status: MET  5.  Patient will demonstrate floor<>stand with CGA and verbal cues for patient's wife to be able to assist. Baseline:  Has to call 9-1-1; +2 max assist floor>stand with UE support 01/19/2023 Goal status: GOAL NOT MET 01/19/2023  **UPDATED GOALS BELOW ** per recert 05/17/2023  SHORT TERM GOALS: Target date: 01/25/24   Pt will perform HEP to maintain lower body strength, flexibility, posture with wife's assistance. Baseline:  Goal status: met 04/12/23  2. Pt will perform sit to stand, 8 of 10 reps with BUE support, with supervision, to maintain safety with transfers.  Baseline: min guard  Goal status: 02/07/23-CGA from elevated surface and max VC to lean forward, ongoing   LONG TERM GOALS: Target date: 01/25/24   Patient will perform HEP progression to maintain lower body strength, flexibility, posture with wife's assistance. Baseline:  Goal status: doing some walking with his wife when she feels safe, has done some pedaling at home as well 12/13/23, working on this , his wife was out of  town and with the heat has not been able to walk much this summer  2.  Pt will ambulate 150-175 ft in 2 MWT with min guard assist, to preserve functional mobility within the home.  Baseline: Goal status:  met 08/25/23  3. Wife will verbalize tips for fall prevention education to minimize fall frequency Baseline:  Goal status:progressing and doing well 4.  Patient will be able to toilet without freezing for over a 4 week period met 11/15/23 5.  Patient will be able to get out of bed from supine with wife assist  minimal Assist,met 10/06/23   ASSESSMENT:  CLINICAL IMPRESSION:  Patients seems to be having a lot more difficulty walking. Patient and wife are trying to do some walking but the heat has limited them, also difficulty with wife being out of town  on vacation.  He has issues at times when distracted that he shuffles and will not pick up his feet, with less distractions and gentle cues he will do better.  WE are doing the maintenance program,  as he cannot and the wife cannot do exercises safely at home, he is becoming more and more difficult to walk at times and is having difficulty with standing    OBJECTIVE IMPAIRMENTS: Abnormal gait, decreased activity tolerance, decreased balance, decreased cognition, decreased coordination, decreased endurance, decreased mobility, difficulty walking, decreased ROM, decreased strength, decreased safety awareness, impaired flexibility, impaired UE functional use, improper body mechanics, and postural dysfunction.   PLAN:  PT FREQUENCY: 1x/week  PT DURATION: 12 weeks  PLANNED INTERVENTIONS: Therapeutic exercises, Therapeutic activity, Neuromuscular re-education, Balance training, Gait training, Patient/Family education, Self Care, Joint mobilization, Stair training, Moist heat, and Manual therapy  PLAN FOR NEXT SESSION:   Will continue to work wiht patient to help him and his wife stay living at home and with less difficulty   Patient Details  Name: KAHIAU SCHEWE MRN: [id number] Date of Birth: 10-Apr-1941 Referring Provider:  Antonio Meth, Jamee SAUNDERS, *

## 2024-01-31 ENCOUNTER — Ambulatory Visit: Admitting: Physical Therapy

## 2024-01-31 ENCOUNTER — Encounter: Payer: Self-pay | Admitting: Physical Therapy

## 2024-01-31 DIAGNOSIS — M6281 Muscle weakness (generalized): Secondary | ICD-10-CM | POA: Diagnosis not present

## 2024-01-31 DIAGNOSIS — R2689 Other abnormalities of gait and mobility: Secondary | ICD-10-CM

## 2024-01-31 DIAGNOSIS — M25512 Pain in left shoulder: Secondary | ICD-10-CM

## 2024-01-31 DIAGNOSIS — R296 Repeated falls: Secondary | ICD-10-CM

## 2024-01-31 DIAGNOSIS — I69354 Hemiplegia and hemiparesis following cerebral infarction affecting left non-dominant side: Secondary | ICD-10-CM

## 2024-01-31 DIAGNOSIS — R262 Difficulty in walking, not elsewhere classified: Secondary | ICD-10-CM

## 2024-01-31 DIAGNOSIS — Z9181 History of falling: Secondary | ICD-10-CM

## 2024-01-31 NOTE — Therapy (Signed)
 OUTPATIENT PHYSICAL THERAPY NEURO TREATMENT  Patient Name: William [name] MRN: [id number] DOB:05-21-1941, 83 y.o., male Today's Date: 01/31/2024  PCP: [name]  DO REFERRING PROVIDER: same   END OF SESSION:  PT End of Session - 01/31/24 1441     Visit Number [id number]    Date for PT Re-Evaluation 02/26/24    Authorization Type HTA    PT Start Time 1438    PT Stop Time 1524    PT Time Calculation (min) 46 min    Equipment Utilized During Treatment Gait belt    Activity Tolerance Patient tolerated treatment well    Behavior During Therapy WFL for tasks assessed/performed               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   HTN (hypertension) 07/19/2015   Hyperlipidemia    Major neurocognitive disorder 01/09/2014   Mild, related to stroke history   Nocturia    Renal insufficiency 06/25/2013   S/P carotid endarterectomy    BILATERAL ICA--  PATENT PER DUPLEX  05-19-2012   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 09/26/2017   Right calf   Urinary frequency    Vitamin D  deficiency    Past Surgical History:  Procedure Laterality Date   APPENDECTOMY  AS CHILD   CARDIOVASCULAR STRESS TEST  03-27-2012  DR CRENSHAW   LOW RISK LEXISCAN  STUDY-- PROBABLE NORMAL PERFUSION AND SOFT TISSUE ATTENUATION/  NO ISCHEMIA/ EF 51%   CAROTID ENDARTERECTOMY Bilateral LEFT  11-12-2008  DR GREG HAYES   RIGHT ICA  2006  ([facility])   CHOLECYSTECTOMY N/A 02/23/2022   Procedure: LAPAROSCOPIC CHOLECYSTECTOMY;  Surgeon: [name], MD;  Location: [facility] OR;  Service:  General;  Laterality: N/A;   CYSTOSCOPY W/ RETROGRADES Bilateral 06/22/2021   Procedure: CYSTOSCOPY WITH RETROGRADE PYELOGRAM;  Surgeon: [name], MD;  Location: [facility] [place];  Service: Urology;  Laterality: Bilateral;   CYSTOSCOPY WITH LITHOLAPAXY N/A 02/26/2013   Procedure: CYSTOSCOPY WITH LITHOLAPAXY;  Surgeon: Senior Matilda, MD;  Location: [facility];  Service: Urology;  Laterality: N/A;   ENDOSCOPIC RETROGRADE CHOLANGIOPANCREATOGRAPHY (ERCP) WITH PROPOFOL  N/A 02/22/2022   Procedure: ENDOSCOPIC RETROGRADE CHOLANGIOPANCREATOGRAPHY (ERCP) WITH PROPOFOL ;  Surgeon: [name], MD;  Location: [name] ENDOSCOPY;  Service: Gastroenterology;  Laterality: N/A;   EYE SURGERY  Jan. 2016   cataract surgery both eyes   INGUINAL HERNIA REPAIR Right 11-08-2006   IR KYPHO EA ADDL LEVEL THORACIC OR LUMBAR  02/12/2021   IR RADIOLOGIST EVAL & MGMT  02/18/2021   MASS EXCISION N/A 03/03/2016   Procedure: EXCISION OF BACK  MASS;  Surgeon: [name], MD;  Location: [facility];  Service: General;  Laterality: N/A;   MOHS SURGERY Left 1/ 2016   [name]-- Basal cell   PROSTATE SURGERY     REMOVAL OF STONES  02/22/2022   Procedure: REMOVAL OF STONES;  Surgeon: [name], MD;  Location: [facility] ENDOSCOPY;  Service: Gastroenterology;;   [name]  02/22/2022  Procedure: SPHINCTEROTOMY;  Surgeon: [name], MD;  Location: [facility] ENDOSCOPY;  Service: Gastroenterology;;   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 N/A 06/22/2021   Procedure: TRANSURETHRAL RESECTION OF BLADDER TUMOR;  Surgeon: [name], MD;  Location: [facility];  Service: Urology;  Laterality: N/A;   TRANSURETHRAL RESECTION OF PROSTATE N/A 02/26/2013   Procedure: TRANSURETHRAL RESECTION OF THE PROSTATE WITH GYRUS INSTRUMENTS;  Surgeon: Senior Matilda, MD;  Location: [facility];  Service: Urology;  Laterality: N/A;   TRANSURETHRAL RESECTION OF  PROSTATE N/A 06/22/2021   Procedure: TRANSURETHRAL RESECTION OF THE PROSTATE (TURP);  Surgeon: [name], MD;  Location: [facility];  Service: Urology;  Laterality: N/A;   Patient Active Problem List   Diagnosis Date Noted   Bronchitis 06/16/2023   Impacted cerumen of left ear 06/16/2023   Acute encephalopathy 04/18/2023   Parkinson's disease (HCC) 04/18/2023   History of CVA with residual deficit 04/18/2023   Chronic kidney disease, stage 3b (HCC) 04/18/2023   Dementia with behavioral disturbance (HCC) 04/18/2023   Anemia of chronic disease 04/18/2023   Laceration of right upper extremity 02/24/2023   Dermatitis 02/24/2023   Lung nodules 02/24/2023   Dysuria 08/03/2022   Nonintractable headache 07/01/2022   Bilateral impacted cerumen 06/24/2022   Rash 06/15/2022   Postoperative ileus (HCC) 02/27/2022   Ileus, postoperative (HCC) 02/26/2022   Choledocholithiasis 02/19/2022   DNR (do not resuscitate) 02/19/2022   Left elbow pain 01/26/2022   Mid back pain on left side 01/26/2022   Rib pain 01/26/2022   Acute pain of left shoulder 11/12/2021   Leukocytes in urine 11/12/2021   Urinary frequency 11/12/2021   Thrush 10/08/2021   Hemiplegia, 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   Insomnia    Prediabetes    Acute 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 (HCC)    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   Transaminitis 07/27/2021   UTI (urinary tract infection) 07/27/2021   CVA (cerebral vascular accident) (HCC) 07/27/2021   Fall 07/27/2021   Hyperglycemia 07/27/2021   Cholelithiasis 07/27/2021   Hypoxia 07/27/2021   Nausea and vomiting 07/27/2021   Acute metabolic encephalopathy 07/27/2021   Normocytic anemia 07/27/2021   Chronic back pain 07/27/2021   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   Closed fracture of first lumbar vertebra with routine healing 02/03/2021   Closed  fracture of multiple ribs 11/18/2020   Anxiety 01/29/2020   Leg pain, bilateral 01/29/2020   Ingrown toenail 07/13/2019   Lumbar spondylosis 05/02/2018   Pain in left knee 03/09/2018   Osteoarthritis of left hip 01/16/2018   Trochanteric bursitis of left hip 01/16/2018   Preventative health care 09/26/2017   HTN (hypertension) 07/19/2015   Hyperlipidemia 07/19/2015   Great toe pain 02/11/2014   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   Obesity (BMI 30-39.9) 06/25/2013   Renal insufficiency 06/25/2013   Weakness of left arm 06/25/2013   Sebaceous cyst 03/03/2011   Sprain of lumbar region 07/31/2010   Rib pain, left 08/29/2009   Carotid artery stenosis, asymptomatic, bilateral 05/02/2009   Eczema, atopic 05/31/2008   Vitamin D  deficiency 03/01/2008   BPH (benign prostatic hyperplasia) 08/06/2007   Fasting hyperglycemia 12/21/2006   History of right MCA infarct 2006    ONSET DATE: 06/29/22 REFERRING DIAG:  Diagnosis  I63.9 (ICD-10-CM) - Cerebrovascular accident (CVA), unspecified mechanism (HCC)  W19.XXXD (ICD-10-CM) - Fall, subsequent encounter    THERAPY DIAG:  Muscle weakness (  generalized)  Other abnormalities of gait and mobility  Hemiplegia and hemiparesis following cerebral infarction affecting left non-dominant side (HCC)  Difficulty in walking, not elsewhere classified  Repeated falls  Acute pain of left shoulder  History of falling  Rationale for Evaluation and Treatment: Rehabilitation  SUBJECTIVE:                                                                                                                                                                                         SUBJECTIVE STATEMENT:  Doing okay, wife reports that after last week me moving the shoulder around seemed to help pain a little  PERTINENT HISTORY:  William [name] is a 83 year old man with dementia, CKD, HTN, CAD, HLD and history of CVA  (2006, 2023),  Parkinson's  PAIN:  Are you having pain? Faces/behavior scale- mild pain BLEs  and back   PRECAUTIONS: Fall  WEIGHT BEARING RESTRICTIONS: No  FALLS: Has patient fallen in last 6 months? No  LIVING ENVIRONMENT: Lives with: lives with their family and lives with their spouse Lives in: House/apartment Stairs: 1 brick high step Has following equipment at home: Environmental consultant - 2 wheeled, Wheelchair (manual), Shower bench, bed side commode, Grab bars, and hospital bed, sliding pad for car  PLOF: Independent with basic ADLs and Independent with household mobility without device  PATIENT GOALS: Walk around the block with his wife. Be able to use the dining room chair rather than have to use the W/C. Increased I with bed mobility and simple tasks at home like make some coffee or brush his teeth, Step over tub to shower, has bench, but he can't really use it. Up and down the one step to get to back deck. 11/23/22: Patient's wife Devere updated his goals today: Walk around store (gets distracted), swing a golf club again (thinking of driving range), get up on his own, walk without leaning forward, more recognition of the left side (from CVA), not need gait belt, OT-- be able to get dressed more on his own, navigate the newspaper.  OBJECTIVE:  TREATMENT: 01/31/24 Nustep level 5 x 6 minutes Bike level 5 x 5 minutes STM to the left shoulder PROM left shoulder 2.5# standing hip abduction Standing ball catch/bounce 2.5# marches 2.5# LAQ Seated ball catch/bounce Partial sit ups Gait HHA 3x 200 feet   01/26/24 Nustep level 5 x 8 minutes Bike level 4 x 5 minutes some verbal and tactile cues to keep upright [employer] kicks Side stepping Backward walking 200 feet walking x 2   01/17/24 NuStep L5x75mins UBE L2 x55mins each way   Alternating punches 2# 20 reps  alt x2 Chest press 3# WaTE 2x15 Standing 2# shoulder flexion alternating  Ball bouncing and hitting while seated  Modified sit ups  2x10   01/10/24 Nustep level 5 x 8 minutes Bike level 5 x 6 minutes Seated ball bounces Standing ball bounce UBE level 4 x 4 minutes backwards hurts left shoulder Partial sit ups Gait outside 1/2 island rest and back CGA Then gait to car  01/03/24 Nustep level 5 x 8 minutes, cues to keep going and for bigger motions Bike level 4 x 6 minutes Partial sit ups 3# wate bar hitting ball Walking with HHA x 300 feet in side x 3 Side stepping, fwd and backward walking [employer] kicks Gait out back door in grass to the car with HHA and a lot of cues  12/29/23 NuStep L5 x72mins UBE L4 x42mins each way STS 2x10 Seated punches with 2# 2x10 Hitting ball with 3# WaTe Ball bouncing  Modified sit ups 2x10 Walk outside to car and transfer into car  12/13/23 Nustep level 5 x 5 minutes UBE level 4 x 6 minutes Walking with hand hold assist a lot of cues for posture and step length we did 200 feet x 3 Sit to supine to sit multiple times working on him trying to do all on his own, did need verbal cues and to get all the way up the second time needed mod A Partial sit ups in sitting Ball bouncing  Walking direction changes  11/22/23 Nustep level 5 x 6 minutes Partial sit ups UBE level 4 x 5 minutes On airex reaching for numbers called out Standing ball toss Gait outside with HHA, a lot of cues and really Min/Mod A due to poor steps today  11/15/23 Nustep level 5 x 6 minutes UBE level 4 x 6 minutes In Pbars side stepping with tband on floor for step length help On airex reaching for numbers on wall 6 toe touches Standing ball toss Passive left shoulder ROM Red tband left shoulder ER/IR row and extension  11/10/23 UBE level 4 x 6 minutes Nustep level 5 x 6 minutes Partial sit ups  2.5# hip marches 2.5# LAQ Standing ball toss Standing ball kicks Side steps Gait with HHA outside out back door and around the the car  11/01/23 UBE level 4 x 6 minutes Nustep level 5 x 6  minutes Feet on ball K2C, rotation, small bridge, isometric abs Right hip extension blue tband supine Supine right hip abduction with sliding board Blue tband clamshells Bed mobility on his own with CGA Gait out the back door and then along the asphalt to his car at the front of the building with [facility]  10/27/23 Nustep level 5 x 6 minutes Bike level 5 x 5 minutes UBE level 4 x 5 minutes Seated partial sit ups Standing right arm 10# row/extension and then 5# left arm Standing with SPC 6 toe taps alternating Seated volley ball Seated 5# LAQ Seated elbow touches for core motions Passive left shoulder stretches   10/11/23 Nustep level 5 x 7 minutes Seated volley ball In pbars on airex reaching for numbers Partial sit ups Elbow touches for trunk and core Gait around the back parking [place] with HHA, needed one long seated rest break, needed a lot of cues, on the second half of [address] he was tired and really dragging his feet with significant forward trunk lean.   Passive motions of the left shoulder 09/06/23 Home assessment with wife: Bathroom set up is  very cramped and crowded.  Has over commode toilet and grab bars, they have a walker in front of the commode, the way it is set up he has to walk side ways into the set up and side ways out, I lowered the front legs and put them in the shower that is directly in front and had the back legs out, this gave him about 10 more to get b/n the toilet and the walker so he could walk in and then turn instead of side ways. Bed transfer from supine:  We did this 4 times, dropped the side rail, raised head of the bed and wife typically uses a strap that she gives Octaviano and he pulls up with it.  Today he got up without it twice, we talked about protecting [name]/s back with leaning back and having a wide base of support, also to give Octaviano cues to lean forward as he tends to be retropulsive.   Shower:  Santina in and looked at this with Devere, gave some ideas  as he is now holding grab bar and stepping over the tub to get in.  Asked about safer way of sitting and bringing legs over she said this was tried but they were unable to get his legs over. Went over HEP:  in his chair talked about reciprocal patterns and she looked on [employer] for a pedal machine Gait: outside with HHA to the mail box and back cues for step length  PATIENT EDUCATION: Education details: none new 02/16/23 Person educated: Patient and Spouse Education method: Explanation, Demonstration, Tactile cues, and Verbal cues Education comprehension: verbalized understanding, returned demonstration, verbal cues required, tactile cues required, and needs further education   ------------------------------------------------------------------------------------------------------- (Measures in this section from initial evaluation unless otherwise noted) DIAGNOSTIC FINDINGS:  MRI 2021 with degenerative changes in lumbar spine, L3-4 R subarticular stenosis COGNITION: Overall cognitive status: History of cognitive impairments - at baseline SENSATION: Not tested COORDINATION: Moderately impaired BLE MUSCLE LENGTH: Hamstrings: severely restricted B Thomas test: Severely restricted B. POSTURE: rounded shoulders, forward head, increased thoracic kyphosis, posterior pelvic tilt, and flexed trunk  LOWER EXTREMITY ROM:   BLE extremely stiff throughout, limited hip ROM in all planes, B knees limited in extension. LOWER EXTREMITY MMT:  3+/5 throughout. Unable to determine accuracy of MMT due to cognition. Functionally demosntrates poor coordinated activation and poor muscular endurance. BED MOBILITY: Min A for Supine<> Sit. Patient was using a ladder in his hospital bed and could move MI. TRANSFERS: Min A, mod VC, tends to lean back.  11/02/22 independent with use of hands on arm chair with a couple of tries CURB: Min A-Has one step out to his deck. STAIRS: N/A  GAIT: Gait pattern: step to pattern,  decreased arm swing- Right, decreased arm swing- Left, decreased step length- Right, decreased step length- Left, shuffling, festinating, trunk flexed, narrow BOS, poor foot clearance- Right, and poor foot clearance- Left Distance walked: 5' Assistive device utilized: Environmental consultant - 2 wheeled Level of assistance: Occasional min A Comments: mod VC to stay close to walker, take longer steps. FUNCTIONAL TESTS:  5 times sit to stand: 38.23  09/29/22  could not performs 5XSTS, 11/02/22 = 47 seconds with cues to use arms, 07/21/23 really struggled today 55 seconds Timed up and go (TUG): 47.91 with RW, 07/21/22 TUG 29  seconds without device, 09/29/22 25 seconds, 11/02/22 TUG 20 seconds, 07/21/23 = 26 seconds no device SBA  PATIENT EDUCATION: Education details: Progress towards goals, POC Person educated: Patient and Spouse Education method: Explanation, Demonstration, and Verbal cues  Education comprehension: verbalized understanding, returned demonstration, verbal cues required, and needs further education  HOME EXERCISE PROGRAM: PWR! moves  GOALS: Goals reviewed with patient? Yes  SHORT TERM GOALS: Target date: 12/22/22  Perform HEP for PWR! Moves with cues from family. Baseline: Pt's wife, Devere, notes walking and hitting beach ball as main HEP. Goal status: GOAL MET, 12/20/2022   2. Decrease 5 x STS to < 35 sec to demonstrate improved functional strength  Baseline: 47 seconds on 5/21 per note; 34.5 sec UE support 12/20/2022  Goal status: GOAL MET, 12/20/2022   3. Perform sit <> stand transfers from average chair height with Supervision.  Baseline: CGA  12/20/2022; (01/10/23) CGA  Goal status: NOT MET  LONG TERM GOALS: Target date: 01/13/24  Patient will perform HEP progression with caregiver assist. Baseline: has minimal HEP Goal status: progressing  2.  Decrease 5 x STS to < 25 sec to demonstrate improved functional strength Baseline:  47 seconds on 5/21 per note; (01/10/23) 36 sec w/ cues for speed/sequence>30.38 sec with BUE supprot Goal status: PARTIALLY MET, 01/24/2023  3. Improve quality of gait to be able to ambulate x 100 feet with supervision. Baseline: CGA with cues  Goal status: Met 08/18/23  4.  Patient will perform his bed mobility with MI including rolling, sit<>supine, and scooting in bed. Baseline: on hard surface mat he is independent but on soft bed at home he struggles, wife reports only needs a hand to get up now 11/02/22; (01/10/23) modified indep.  Goal status: MET  5.  Patient will demonstrate floor<>stand with CGA and verbal cues for patient's wife to be able to assist. Baseline:  Has to call 9-1-1; +2 max assist floor>stand with UE support 01/19/2023 Goal status: GOAL NOT MET 01/19/2023  **UPDATED GOALS BELOW ** per recert 05/17/2023  SHORT TERM GOALS: Target date: 01/25/24   Pt will perform HEP to maintain lower body strength, flexibility, posture with wife's assistance. Baseline:  Goal status: met 04/12/23  2. Pt will perform sit to stand, 8 of 10 reps with BUE support, with supervision, to maintain safety with transfers.  Baseline: min guard  Goal status: 02/07/23-CGA from elevated surface and max VC to lean forward, ongoing   LONG TERM GOALS: Target date: 01/25/24   Patient will perform HEP progression to maintain lower body strength, flexibility, posture with wife's assistance. Baseline:  Goal status: doing some walking with his wife when she feels safe, has done some pedaling at home as well 12/13/23, working on this , his wife was out of  town and with the heat has not been able to walk much this summer  2.  Pt will ambulate 150-175 ft in 2 MWT with min guard assist, to preserve functional mobility within the home.  Baseline: Goal status:  met 08/25/23  3. Wife will verbalize tips for fall prevention education to minimize fall frequency Baseline:  Goal status:progressing and doing well 4.   Patient will be able to toilet without freezing for over a 4 week period met 11/15/23 5.  Patient will be able to get out of bed from supine with wife assist minimal Assist,met 10/06/23   ASSESSMENT:  CLINICAL IMPRESSION:  Patient doing well, wife reports that the movements that I did with the shoulder last week helped decrease his pain, we did this again with the T-gun.  He is tending to be  back on his heels some with standing from sitting.  HE is having some difficulty with verbalization, not talking loud enoughHe has issues at times when distracted that he shuffles and will not pick up his feet, with less distractions and gentle cues he will do better.  WE are doing the maintenance program,  as he cannot and the wife cannot do exercises safely at home, he is becoming more and more difficult to walk at times and is having difficulty with standing    OBJECTIVE IMPAIRMENTS: Abnormal gait, decreased activity tolerance, decreased balance, decreased cognition, decreased coordination, decreased endurance, decreased mobility, difficulty walking, decreased ROM, decreased strength, decreased safety awareness, impaired flexibility, impaired UE functional use, improper body mechanics, and postural dysfunction.   PLAN:  PT FREQUENCY: 1x/week  PT DURATION: 12 weeks  PLANNED INTERVENTIONS: Therapeutic exercises, Therapeutic activity, Neuromuscular re-education, Balance training, Gait training, Patient/Family education, Self Care, Joint mobilization, Stair training, Moist heat, and Manual therapy  PLAN FOR NEXT SESSION:   Will continue to work wiht patient to help him and his wife stay living at home and with less difficulty   Patient Details  Name: William [name] MRN: [id number] Date of Birth: Apr 10, 1941 Referring Provider:  Antonio Meth, Jamee SAUNDERS, *

## 2024-02-07 ENCOUNTER — Encounter: Payer: Self-pay | Admitting: Physical Therapy

## 2024-02-07 ENCOUNTER — Ambulatory Visit: Admitting: Physical Therapy

## 2024-02-07 DIAGNOSIS — R296 Repeated falls: Secondary | ICD-10-CM

## 2024-02-07 DIAGNOSIS — I69354 Hemiplegia and hemiparesis following cerebral infarction affecting left non-dominant side: Secondary | ICD-10-CM

## 2024-02-07 DIAGNOSIS — M6281 Muscle weakness (generalized): Secondary | ICD-10-CM | POA: Diagnosis not present

## 2024-02-07 DIAGNOSIS — R278 Other lack of coordination: Secondary | ICD-10-CM

## 2024-02-07 DIAGNOSIS — M25512 Pain in left shoulder: Secondary | ICD-10-CM

## 2024-02-07 DIAGNOSIS — R262 Difficulty in walking, not elsewhere classified: Secondary | ICD-10-CM

## 2024-02-07 DIAGNOSIS — Z9181 History of falling: Secondary | ICD-10-CM

## 2024-02-07 DIAGNOSIS — R2689 Other abnormalities of gait and mobility: Secondary | ICD-10-CM

## 2024-02-07 NOTE — Therapy (Signed)
 OUTPATIENT PHYSICAL THERAPY NEURO TREATMENT  Patient Name: William [name] MRN: [id number] DOB:08-14-1940, 83 y.o., male Today's Date: 02/07/2024  PCP: [name]  DO REFERRING PROVIDER: same   END OF SESSION:  PT End of Session - 02/07/24 1533     Visit Number 89    Date for PT Re-Evaluation 02/26/24    Authorization Type HTA    PT Start Time 1530    PT Stop Time 1615    PT Time Calculation (min) 45 min    Equipment Utilized During Treatment Gait belt    Activity Tolerance Patient tolerated treatment well    Behavior During Therapy WFL for tasks assessed/performed               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   HTN (hypertension) 07/19/2015   Hyperlipidemia    Major neurocognitive disorder 01/09/2014   Mild, related to stroke history   Nocturia    Renal insufficiency 06/25/2013   S/P carotid endarterectomy    BILATERAL ICA--  PATENT PER DUPLEX  05-19-2012   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/26/2017   Right calf   Urinary frequency    Vitamin D  deficiency    Past Surgical History:  Procedure Laterality Date   APPENDECTOMY  AS CHILD   CARDIOVASCULAR STRESS TEST  03-27-2012  DR CRENSHAW   LOW RISK LEXISCAN  STUDY-- PROBABLE NORMAL PERFUSION AND SOFT TISSUE ATTENUATION/  NO ISCHEMIA/ EF 51%   CAROTID ENDARTERECTOMY Bilateral LEFT  11-12-2008  DR GREG HAYES   RIGHT ICA  2006  ([facility])   CHOLECYSTECTOMY N/A 02/23/2022   Procedure: LAPAROSCOPIC CHOLECYSTECTOMY;  Surgeon: [name], MD;  Location: [facility] OR;  Service:  General;  Laterality: N/A;   CYSTOSCOPY W/ RETROGRADES Bilateral 06/22/2021   Procedure: CYSTOSCOPY WITH RETROGRADE PYELOGRAM;  Surgeon: [name], MD;  Location: [facility];  Service: Urology;  Laterality: Bilateral;   CYSTOSCOPY WITH LITHOLAPAXY N/A 02/26/2013   Procedure: CYSTOSCOPY WITH LITHOLAPAXY;  Surgeon: Senior Matilda, MD;  Location: [facility];  Service: Urology;  Laterality: N/A;   ENDOSCOPIC RETROGRADE CHOLANGIOPANCREATOGRAPHY (ERCP) WITH PROPOFOL  N/A 02/22/2022   Procedure: ENDOSCOPIC RETROGRADE CHOLANGIOPANCREATOGRAPHY (ERCP) WITH PROPOFOL ;  Surgeon: [name], MD;  Location: [facility] END[facility]COPY;  Service: Gastroenterology;  Laterality: N/A;   EYE SURGERY  Jan. 2016   cataract surgery both eyes   INGUINAL HERNIA REPAIR Right 11-08-2006   IR KYPHO EA ADDL LEVEL THORACIC OR LUMBAR  02/12/2021   IR RADIOLOGIST EVAL & MGMT  02/18/2021   MASS EXCISION N/A 03/03/2016   Procedure: EXCISION OF BACK  MASS;  Surgeon: [name], MD;  Location: [facility];  Service: General;  Laterality: N/A;   MOHS SURGERY Left 1/ 2016   [name]-- Basal cell   PROSTATE SURGERY     REMOVAL OF STONES  02/22/2022   Procedure: REMOVAL OF STONES;  Surgeon: [name], MD;  Location: [facility] ENDOSCOPY;  Service: Gastroenterology;;   [name]  02/22/2022  Procedure: SPHINCTEROTOMY;  Surgeon: [name], MD;  Location: [facility] ENDOSCOPY;  Service: Gastroenterology;;   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 N/A 06/22/2021   Procedure: TRANSURETHRAL RESECTION OF BLADDER TUMOR;  Surgeon: [name], MD;  Location: [facility];  Service: Urology;  Laterality: N/A;   TRANSURETHRAL RESECTION OF PROSTATE N/A 02/26/2013   Procedure: TRANSURETHRAL RESECTION OF THE PROSTATE WITH GYRUS INSTRUMENTS;  Surgeon: Senior Matilda, MD;  Location: [facility];  Service: Urology;  Laterality: N/A;   TRANSURETHRAL RESECTION OF  PROSTATE N/A 06/22/2021   Procedure: TRANSURETHRAL RESECTION OF THE PROSTATE (TURP);  Surgeon: [name], MD;  Location[facility];  Service: Urology;  Laterality: N/A;   Patient Active Problem List   Diagnosis Date Noted   Bronchitis 06/16/2023   Impacted cerumen of left ear 06/16/2023   Acute encephalopathy 04/18/2023   Parkinson's disease (HCC) 04/18/2023   History of CVA with residual deficit 04/18/2023   Chronic kidney disease, stage 3b (HCC) 04/18/2023   Dementia with behavioral disturbance (HCC) 04/18/2023   Anemia of chronic disease 04/18/2023   Laceration of right upper extremity 02/24/2023   Dermatitis 02/24/2023   Lung nodules 02/24/2023   Dysuria 08/03/2022   Nonintractable headache 07/01/2022   Bilateral impacted cerumen 06/24/2022   Rash 06/15/2022   Postoperative ileus (HCC) 02/27/2022   Ileus, postoperative (HCC) 02/26/2022   Choledocholithiasis 02/19/2022   DNR (do not resuscitate) 02/19/2022   Left elbow pain 01/26/2022   Mid back pain on left side 01/26/2022   Rib pain 01/26/2022   Acute pain of left shoulder 11/12/2021   Leukocytes in urine 11/12/2021   Urinary frequency 11/12/2021   Thrush 10/08/2021   Hemiplegia, 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   Insomnia    Prediabetes    Acute 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 (HCC)    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   Transaminitis 07/27/2021   UTI (urinary tract infection) 07/27/2021   CVA (cerebral vascular accident) (HCC) 07/27/2021   Fall 07/27/2021   Hyperglycemia 07/27/2021   Cholelithiasis 07/27/2021   Hypoxia 07/27/2021   Nausea and vomiting 07/27/2021   Acute metabolic encephalopathy 07/27/2021   Normocytic anemia 07/27/2021   Chronic back pain 07/27/2021   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   Closed fracture of first lumbar vertebra with routine healing 02/03/2021   Closed  fracture of multiple ribs 11/18/2020   Anxiety 01/29/2020   Leg pain, bilateral 01/29/2020   Ingrown toenail 07/13/2019   Lumbar spondylosis 05/02/2018   Pain in left knee 03/09/2018   Osteoarthritis of left hip 01/16/2018   Trochanteric bursitis of left hip 01/16/2018   Preventative health care 09/26/2017   HTN (hypertension) 07/19/2015   Hyperlipidemia 07/19/2015   Great toe pain 02/11/2014   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   Obesity (BMI 30-39.9) 06/25/2013   Renal insufficiency 06/25/2013   Weakness of left arm 06/25/2013   Sebaceous cyst 03/03/2011   Sprain of lumbar region 07/31/2010   Rib pain, left 08/29/2009   Carotid artery stenosis, asymptomatic, bilateral 05/02/2009   Eczema, atopic 05/31/2008   Vitamin D  deficiency 03/01/2008   BPH (benign prostatic hyperplasia) 08/06/2007   Fasting hyperglycemia 12/21/2006   History of right MCA infarct 2006    ONSET DATE: 06/29/22 REFERRING DIAG:  Diagnosis  I63.9 (ICD-10-CM) - Cerebrovascular accident (CVA), unspecified mechanism (HCC)  W19.XXXD (ICD-10-CM) - Fall, subsequent encounter    THERAPY DIAG:  Muscle weakness (  generalized)  Other abnormalities of gait and mobility  Hemiplegia and hemiparesis following cerebral infarction affecting left non-dominant side (HCC)  Difficulty in walking, not elsewhere classified  Repeated falls  Acute pain of left shoulder  History of falling  Other lack of coordination  Rationale for Evaluation and Treatment: Rehabilitation  SUBJECTIVE:                                                                                                                                                                                         SUBJECTIVE STATEMENT:  Wife reports that he has c/o less pain since we did some massage. He has an appointment with shoulder MD in 2 weeks  PERTINENT HISTORY:  William [name] is a 83 year old man with dementia, CKD, HTN, CAD, HLD and history  of CVA  (2006, 2023), Parkinson's  PAIN:  Are you having pain? Faces/behavior scale- mild pain BLEs  and back   PRECAUTIONS: Fall  WEIGHT BEARING RESTRICTIONS: No  FALLS: Has patient fallen in last 6 months? No  LIVING ENVIRONMENT: Lives with: lives with their family and lives with their spouse Lives in: House/apartment Stairs: 1 brick high step Has following equipment at home: Environmental consultant - 2 wheeled, Wheelchair (manual), Shower bench, bed side commode, Grab bars, and hospital bed, sliding pad for car  PLOF: Independent with basic ADLs and Independent with household mobility without device  PATIENT GOALS: Walk around the block with his wife. Be able to use the dining room chair rather than have to use the W/C. Increased I with bed mobility and simple tasks at home like make some coffee or brush his teeth, Step over tub to shower, has bench, but he can't really use it. Up and down the one step to get to back deck. 11/23/22: Patient's wife William [name] updated his goals today: Walk around store (gets distracted), swing a golf club again (thinking of driving range), get up on his own, walk without leaning forward, more recognition of the left side (from CVA), not need gait belt, OT-- be able to get dressed more on his own, navigate the newspaper.  OBJECTIVE:  TREATMENT: 02/07/24 Nustep level 5 x 6 minutes Bike level 4 x 5 minutes STM to the left upper trap and rhomboid Passive ROM of the left shoulder Seated ball bouncing, some trying to get him to use the left hand Gait outside around the parking William [name] with one rest sitting and onto the front door with one rest standing.   Gait inside 400 ' without rest but with CGA  01/31/24 Nustep level 5 x 6 minutes Bike level 5 x 5 minutes STM to the  left shoulder PROM left shoulder 2.5# standing hip abduction Standing ball catch/bounce 2.5# marches 2.5# LAQ Seated ball catch/bounce Partial sit ups Gait HHA 3x 200 feet   01/26/24 Nustep level 5 x  8 minutes Bike level 4 x 5 minutes some verbal and tactile cues to keep upright [employer] kicks Side stepping Backward walking 200 feet walking x 2   01/17/24 NuStep L5x10mins UBE L2 x47mins each way   Alternating punches 2# 20 reps alt x2 Chest press 3# WaTE 2x15 Standing 2# shoulder flexion alternating  Ball bouncing and hitting while seated  Modified sit ups 2x10   01/10/24 Nustep level 5 x 8 minutes Bike level 5 x 6 minutes Seated ball bounces Standing ball bounce UBE level 4 x 4 minutes backwards hurts left shoulder Partial sit ups Gait outside 1/2 island rest and back CGA Then gait to car  01/03/24 Nustep level 5 x 8 minutes, cues to keep going and for bigger motions Bike level 4 x 6 minutes Partial sit ups 3# wate bar hitting ball Walking with HHA x 300 feet in side x 3 Side stepping, fwd and backward walking [employer] kicks Gait out back door in grass to the car with HHA and a lot of cues  12/29/23 NuStep L5 x62mins UBE L4 x17mins each way STS 2x10 Seated punches with 2# 2x10 Hitting ball with 3# WaTe Ball bouncing  Modified sit ups 2x10 Walk outside to car and transfer into car  12/13/23 Nustep level 5 x 5 minutes UBE level 4 x 6 minutes Walking with hand hold assist a lot of cues for posture and step length we did 200 feet x 3 Sit to supine to sit multiple times working on him trying to do all on his own, did need verbal cues and to get all the way up the second time needed mod A Partial sit ups in sitting Ball bouncing  Walking direction changes  11/22/23 Nustep level 5 x 6 minutes Partial sit ups UBE level 4 x 5 minutes On airex reaching for numbers called out Standing ball toss Gait outside with HHA, a lot of cues and really Min/Mod A due to poor steps today  11/15/23 Nustep level 5 x 6 minutes UBE level 4 x 6 minutes In Pbars side stepping with tband on floor for step length help On airex reaching for numbers on wall 6 toe  touches Standing ball toss Passive left shoulder ROM Red tband left shoulder ER/IR row and extension PATIENT EDUCATION: Education details: none new 02/16/23 Person educated: Patient and Spouse Education method: Explanation, Demonstration, Tactile cues, and Verbal cues Education comprehension: verbalized understanding, returned demonstration, verbal cues required, tactile cues required, and needs further education   ------------------------------------------------------------------------------------------------------- (Measures in this section from initial evaluation unless otherwise noted) DIAGNOSTIC FINDINGS:  MRI 2021 with degenerative changes in lumbar spine, L3-4 R subarticular stenosis COGNITION: Overall cognitive status: History of cognitive impairments - at baseline SENSATION: Not tested COORDINATION: Moderately impaired BLE MUSCLE LENGTH: Hamstrings: severely restricted B Thomas test: Severely restricted B. POSTURE: rounded shoulders, forward head, increased thoracic kyphosis, posterior pelvic tilt, and flexed trunk  LOWER EXTREMITY ROM:   BLE extremely stiff throughout, limited hip ROM in all planes, B knees limited in extension. LOWER EXTREMITY MMT:  3+/5 throughout. Unable to determine accuracy of MMT due to cognition. Functionally demosntrates poor coordinated activation and poor muscular endurance. BED MOBILITY: Min A for Supine<> Sit. Patient was using a ladder in his hospital bed and could  move MI. TRANSFERS: Min A, mod VC, tends to lean back.  11/02/22 independent with use of hands on arm chair with a couple of tries CURB: Min A-Has one step out to his deck. STAIRS: N/A  GAIT: Gait pattern: step to pattern, decreased arm swing- Right, decreased arm swing- Left, decreased step length- Right, decreased step length- Left, shuffling, festinating, trunk flexed, narrow BOS, poor foot clearance- Right, and poor foot clearance- Left Distance walked: 64' Assistive device utilized:  Environmental consultant - 2 wheeled Level of assistance: Occasional min A Comments: mod VC to stay close to walker, take longer steps. FUNCTIONAL TESTS:  5 times sit to stand: 38.23  09/29/22  could not performs 5XSTS, 11/02/22 = 47 seconds with cues to use arms, 07/21/23 really struggled today 55 seconds Timed up and go (TUG): 47.91 with RW, 07/21/22 TUG 29  seconds without device, 09/29/22 25 seconds, 11/02/22 TUG 20 seconds, 07/21/23 = 26 seconds no device SBA                                                                     PATIENT EDUCATION: Education details: Progress towards goals, POC Person educated: Patient and Spouse Education method: Programmer, multimedia, Facilities manager, and Verbal cues  Education comprehension: verbalized understanding, returned demonstration, verbal cues required, and needs further education  HOME EXERCISE PROGRAM: PWR! moves  GOALS: Goals reviewed with patient? Yes  SHORT TERM GOALS: Target date: 12/22/22  Perform HEP for PWR! Moves with cues from family. Baseline: Pt's wife, William [name], notes walking and hitting beach ball as main HEP. Goal status: GOAL MET, 12/20/2022   2. Decrease 5 x STS to < 35 sec to demonstrate improved functional strength  Baseline: 47 seconds on 5/21 per note; 34.5 sec UE support 12/20/2022  Goal status: GOAL MET, 12/20/2022   3. Perform sit <> stand transfers from average chair height with Supervision.  Baseline: CGA  12/20/2022; (01/10/23) CGA  Goal status: NOT MET  LONG TERM GOALS: Target date: 01/13/24  Patient will perform HEP progression with caregiver assist. Baseline: has minimal HEP Goal status: progressing  2.  Decrease 5 x STS to < 25 sec to demonstrate improved functional strength Baseline: 47 seconds on 5/21 per note; (01/10/23) 36 sec w/ cues for speed/sequence>30.38 sec with BUE supprot Goal status: PARTIALLY MET, 01/24/2023  3. Improve quality of gait to be able to ambulate x 100 feet with supervision. Baseline: CGA with cues  Goal status: Met 08/18/23,  needing supervision on level surfaces but if a busy environment will need CGA  4.  Patient will perform his bed mobility with MI including rolling, sit<>supine, and scooting in bed. Baseline: on hard surface mat he is independent but on soft bed at home he struggles, wife reports only needs a hand to get up now 11/02/22; (01/10/23) modified indep.  Goal status: MET  5.  Patient will demonstrate floor<>stand with CGA and verbal cues for patient's wife to be able to assist. Baseline:  Has to call 9-1-1; +2 max assist floor>stand with UE support 01/19/2023 Goal status: GOAL NOT MET 01/19/2023  **UPDATED GOALS BELOW ** per recert 05/17/2023  SHORT TERM GOALS: Target date: 01/25/24   Pt will perform HEP to maintain lower body strength, flexibility, posture with wife's assistance. Baseline:  Goal status: met 04/12/23  2. Pt will perform sit to stand, 8 of 10 reps with BUE support, with supervision, to maintain safety with transfers.  Baseline: min guard  Goal status: 02/07/23-CGA from elevated surface and max VC to lean forward, ongoing   LONG TERM GOALS: Target date: 01/25/24   Patient will perform HEP progression to maintain lower body strength, flexibility, posture with wife's assistance. Baseline:  Goal status: doing some walking with his wife when she feels safe, has done some pedaling at home as well 12/13/23, working on this , his wife was out of  town and with the heat has not been able to walk much this summer  2.  Pt will ambulate 150-175 ft in 2 MWT with min guard assist, to preserve functional mobility within the home.  Baseline: Goal status:  met 08/25/23  3. Wife will verbalize tips for fall prevention education to minimize fall frequency Baseline:  Goal status:progressing and doing well 4.  Patient will be able to toilet without freezing for over a 4 week period met 11/15/23 5.  Patient will be able to get out of bed from supine with wife assist minimal Assist,met  10/06/23   ASSESSMENT:  CLINICAL IMPRESSION:  Patient reports that the STM we did helps, wife reports that he had less c/o pain with this.  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   He has more difficulty using the left hand to catch and bounce the ball today.  WE are doing the maintenance program,  as he cannot and the wife cannot do exercises safely at home, he is becoming more and more difficult to walk at times and is having difficulty with standing    OBJECTIVE IMPAIRMENTS: Abnormal gait, decreased activity tolerance, decreased balance, decreased cognition, decreased coordination, decreased endurance, decreased mobility, difficulty walking, decreased ROM, decreased strength, decreased safety awareness, impaired flexibility, impaired UE functional use, improper body mechanics, and postural dysfunction.   PLAN:  PT FREQUENCY: 1x/week  PT DURATION: 12 weeks  PLANNED INTERVENTIONS: Therapeutic exercises, Therapeutic activity, Neuromuscular re-education, Balance training, Gait training, Patient/Family education, Self Care, Joint mobilization, Stair training, Moist heat, and Manual therapy  PLAN FOR NEXT SESSION:   Will continue to work with patient to help him and his wife stay living at home and with less difficulty   Patient Details  Name: William [name] MRN: [id number] Date of Birth: [birth year] Referring Provider:  Antonio Meth, Jamee SAUNDERS, *

## 2024-02-10 ENCOUNTER — Other Ambulatory Visit: Payer: Self-pay

## 2024-02-14 ENCOUNTER — Ambulatory Visit: Attending: Family Medicine | Admitting: Physical Therapy

## 2024-02-14 ENCOUNTER — Encounter: Payer: Self-pay | Admitting: Physical Therapy

## 2024-02-14 DIAGNOSIS — M25512 Pain in left shoulder: Secondary | ICD-10-CM | POA: Diagnosis not present

## 2024-02-14 DIAGNOSIS — Z9181 History of falling: Secondary | ICD-10-CM | POA: Insufficient documentation

## 2024-02-14 DIAGNOSIS — M6281 Muscle weakness (generalized): Secondary | ICD-10-CM | POA: Insufficient documentation

## 2024-02-14 DIAGNOSIS — R2689 Other abnormalities of gait and mobility: Secondary | ICD-10-CM | POA: Insufficient documentation

## 2024-02-14 DIAGNOSIS — R262 Difficulty in walking, not elsewhere classified: Secondary | ICD-10-CM | POA: Diagnosis not present

## 2024-02-14 DIAGNOSIS — I69354 Hemiplegia and hemiparesis following cerebral infarction affecting left non-dominant side: Secondary | ICD-10-CM | POA: Diagnosis not present

## 2024-02-14 DIAGNOSIS — R296 Repeated falls: Secondary | ICD-10-CM | POA: Insufficient documentation

## 2024-02-14 NOTE — Therapy (Signed)
 OUTPATIENT PHYSICAL THERAPY NEURO TREATMENT Progress Note Reporting Period 11/22/23 to 02/14/24 for visits 81-90  See note below for Objective Data and Assessment of Progress/Goals.     Patient Name: William [name] MRN: [id number] DOB:[birth year], 83 y.o., male Today's Date: 02/14/2024  PCP: [name]  DO REFERRING PROVIDER: same   END OF SESSION:  PT End of Session - 02/14/24 1444     Visit Number 90    Date for PT Re-Evaluation 02/26/24    Authorization Type HTA    PT Start Time 1438    PT Stop Time 1520    PT Time Calculation (min) 42 min    Equipment Utilized During Treatment Gait belt    Activity Tolerance Patient tolerated treatment well    Behavior During Therapy WFL for tasks assessed/performed               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   HTN (hypertension) 07/19/2015   Hyperlipidemia    Major neurocognitive disorder 01/09/2014   Mild, related to stroke history   Nocturia    Renal insufficiency 06/25/2013   S/P carotid endarterectomy    BILATERAL ICA--  PATENT PER DUPLEX  05-19-2012   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/26/2017   Right calf   Urinary frequency    Vitamin D  deficiency    Past Surgical History:  Procedure Laterality Date   APPENDECTOMY  AS CHILD   CARDIOVASCULAR STRESS TEST  03-27-2012  DR CRENSHAW   LOW RISK LEXISCAN  STUDY-- PROBABLE NORMAL PERFUSION AND SOFT TISSUE ATTENUATION/  NO ISCHEMIA/ EF 51%   CAROTID ENDARTERECTOMY Bilateral LEFT  11-12-2008  DR GREG HAYES   RIGHT ICA  2006  ([facility])    CHOLECYSTECTOMY N/A 02/23/2022   Procedure: LAPAROSCOPIC CHOLECYSTECTOMY;  Surgeon: [name], MD;  Location: [facility];  Service: General;  Laterality: N/A;   CYSTOSCOPY W/ RETROGRADES Bilateral 06/22/2021   Procedure: CYSTOSCOPY WITH RETROGRADE PYELOGRAM;  Surgeon: [name], MD;  Location: [facility];  Service: Urology;  Laterality: Bilateral;   CYSTOSCOPY WITH LITHOLAPAXY N/A 02/26/2013   Procedure: CYSTOSCOPY WITH LITHOLAPAXY;  Surgeon: Senior Matilda, MD;  Location: [facility];  Service: Urology;  Laterality: N/A;   ENDOSCOPIC RETROGRADE CHOLANGIOPANCREATOGRAPHY (ERCP) WITH PROPOFOL  N/A 02/22/2022   Procedure: ENDOSCOPIC RETROGRADE CHOLANGIOPANCREATOGRAPHY (ERCP) WITH PROPOFOL ;  Surgeon: [name], MD;  Location: [facility];  Service: Gastroenterology;  Laterality: N/A;   EYE SURGERY  Jan. 2016   cataract surgery both eyes   INGUINAL HERNIA REPAIR Right 11-08-2006   IR KYPHO EA ADDL LEVEL THORACIC OR LUMBAR  02/12/2021   IR RADIOLOGIST EVAL & MGMT  02/18/2021   MASS EXCISION N/A 03/03/2016   Procedure: EXCISION OF BACK  MASS;  Surgeon: [name], MD;  Location: [facility];  Service: General;  Laterality: N/A;   MOHS SURGERY Left 1/ 2016   [name]-- Basal cell   PROSTATE SURGERY     REMOVAL OF STONES  02/22/2022  Procedure: REMOVAL OF STONES;  Surgeon: [name], MD;  Location: [facility];  Service: Gastroenterology;;   [name]  02/22/2022   Procedure: [name];  Surgeon: [name], MD;  Location: [facility];  Service: Gastroenterology;;   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 N/A 06/22/2021   Procedure: TRANSURETHRAL RESECTION OF BLADDER TUMOR;  Surgeon: [name], MD;  Location: [facility];  Service: Urology;  Laterality: N/A;   TRANSURETHRAL RESECTION OF PROSTATE N/A 02/26/2013   Procedure: TRANSURETHRAL RESECTION OF THE PROSTATE WITH GYRUS INSTRUMENTS;   Surgeon: Senior Matilda, MD;  Location: [facility];  Service: Urology;  Laterality: N/A;   TRANSURETHRAL RESECTION OF PROSTATE N/A 06/22/2021   Procedure: TRANSURETHRAL RESECTION OF THE PROSTATE (TURP);  Surgeon: [name], MD;  Location: [facility];  Service: Urology;  Laterality: N/A;   Patient Active Problem List   Diagnosis Date Noted   Bronchitis 06/16/2023   Impacted cerumen of left ear 06/16/2023   Acute encephalopathy 04/18/2023   Parkinson's disease (HCC) 04/18/2023   History of CVA with residual deficit 04/18/2023   Chronic kidney disease, stage 3b (HCC) 04/18/2023   Dementia with behavioral disturbance (HCC) 04/18/2023   Anemia of chronic disease 04/18/2023   Laceration of right upper extremity 02/24/2023   Dermatitis 02/24/2023   Lung nodules 02/24/2023   Dysuria 08/03/2022   Nonintractable headache 07/01/2022   Bilateral impacted cerumen 06/24/2022   Rash 06/15/2022   Postoperative ileus (HCC) 02/27/2022   Ileus, postoperative (HCC) 02/26/2022   Choledocholithiasis 02/19/2022   DNR (do not resuscitate) 02/19/2022   Left elbow pain 01/26/2022   Mid back pain on left side 01/26/2022   Rib pain 01/26/2022   Acute pain of left shoulder 11/12/2021   Leukocytes in urine 11/12/2021   Urinary frequency 11/12/2021   Thrush 10/08/2021   Hemiplegia, 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   Insomnia    Prediabetes    Acute 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 (HCC)    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   Transaminitis 07/27/2021   UTI (urinary tract infection) 07/27/2021   CVA (cerebral vascular accident) (HCC) 07/27/2021   Fall 07/27/2021   Hyperglycemia 07/27/2021   Cholelithiasis 07/27/2021   Hypoxia 07/27/2021   Nausea and vomiting 07/27/2021   Acute metabolic encephalopathy 07/27/2021   Normocytic anemia 07/27/2021   Chronic back pain 07/27/2021   Malignant  neoplasm of overlapping sites of bladder (HCC) 06/22/2021   Closed fracture of first lumbar vertebra with routine healing 02/03/2021   Closed fracture of multiple ribs 11/18/2020   Anxiety 01/29/2020   Leg pain, bilateral 01/29/2020   Ingrown toenail 07/13/2019   Lumbar spondylosis 05/02/2018   Pain in left knee 03/09/2018   Osteoarthritis of left hip 01/16/2018   Trochanteric bursitis of left hip 01/16/2018   Preventative health care 09/26/2017   HTN (hypertension) 07/19/2015   Hyperlipidemia 07/19/2015   Great toe pain 02/11/2014   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   Obesity (BMI 30-39.9) 06/25/2013   Renal insufficiency 06/25/2013   Weakness of left arm 06/25/2013   Sebaceous cyst 03/03/2011   Sprain of lumbar region 07/31/2010   Rib pain, left 08/29/2009   Carotid artery stenosis, asymptomatic, bilateral 05/02/2009   Eczema, atopic 05/31/2008   Vitamin D  deficiency 03/01/2008   BPH (benign prostatic hyperplasia) 08/06/2007   Fasting hyperglycemia 12/21/2006   History of right MCA infarct 2006    ONSET DATE: 06/29/22 REFERRING DIAG:  Diagnosis  I63.9 (  ICD-10-CM) - Cerebrovascular accident (CVA), unspecified mechanism (HCC)  W19.XXXD (ICD-10-CM) - Fall, subsequent encounter    THERAPY DIAG:  Muscle weakness (generalized)  Other abnormalities of gait and mobility  Hemiplegia and hemiparesis following cerebral infarction affecting left non-dominant side (HCC)  Difficulty in walking, not elsewhere classified  Repeated falls  Acute pain of left shoulder  History of falling  Rationale for Evaluation and Treatment: Rehabilitation  SUBJECTIVE:                                                                                                                                                                                         SUBJECTIVE STATEMENT:  Will see shoulder MD next week, no falls  PERTINENT HISTORY:  William [name] is a 83 year old man with  dementia, CKD, HTN, CAD, HLD and history of CVA  (2006, 2023), Parkinson's  PAIN:  Are you having pain? Faces/behavior scale- mild pain BLEs  and back   PRECAUTIONS: Fall  WEIGHT BEARING RESTRICTIONS: No  FALLS: Has patient fallen in last 6 months? No  LIVING ENVIRONMENT: Lives with: lives with their family and lives with their spouse Lives in: House/apartment Stairs: 1 brick high step Has following equipment at home: Environmental consultant - 2 wheeled, Wheelchair (manual), Shower bench, bed side commode, Grab bars, and hospital bed, sliding pad for car  PLOF: Independent with basic ADLs and Independent with household mobility without device  PATIENT GOALS: Walk around the block with his wife. Be able to use the dining room chair rather than have to use the W/C. Increased I with bed mobility and simple tasks at home like make some coffee or brush his teeth, Step over tub to shower, has bench, but he can't really use it. Up and down the one step to get to back deck. 11/23/22: Patient's wife Devere updated his goals today: Walk around store (gets distracted), swing a golf club again (thinking of driving range), get up on his own, walk without leaning forward, more recognition of the left side (from CVA), not need gait belt, OT-- be able to get dressed more on his own, navigate the newspaper.  OBJECTIVE:  TREATMENT: 02/14/24 Nustep level 5 x 6 minutes Bike level 4 x 5 minutes UBE level 4 x 5 minutes STM to the left shoulder area mainly upper trap Some genlte PROM of the left shoulder Gait with HHA out the back door around one of the islands on the side of the back building back up the hill to the picnic table and rest and then through the grass on the side of the building to the front door, this was very fatiguing for  him, a lot of cues verbal and physical to get him to stand up Gait out to the car  02/07/24 Nustep level 5 x 6 minutes Bike level 4 x 5 minutes STM to the left upper trap and  rhomboid Passive ROM of the left shoulder Seated ball bouncing, some trying to get him to use the left hand Gait outside around the parking [place] with one rest sitting and onto the front door with one rest standing.   Gait inside 400 ' without rest but with CGA  01/31/24 Nustep level 5 x 6 minutes Bike level 5 x 5 minutes STM to the left shoulder PROM left shoulder 2.5# standing hip abduction Standing ball catch/bounce 2.5# marches 2.5# LAQ Seated ball catch/bounce Partial sit ups Gait HHA 3x 200 feet   01/26/24 Nustep level 5 x 8 minutes Bike level 4 x 5 minutes some verbal and tactile cues to keep upright [employer] kicks Side stepping Backward walking 200 feet walking x 2   01/17/24 NuStep L5x75mins UBE L2 x58mins each way   Alternating punches 2# 20 reps alt x2 Chest press 3# WaTE 2x15 Standing 2# shoulder flexion alternating  Ball bouncing and hitting while seated  Modified sit ups 2x10   01/10/24 Nustep level 5 x 8 minutes Bike level 5 x 6 minutes Seated ball bounces Standing ball bounce UBE level 4 x 4 minutes backwards hurts left shoulder Partial sit ups Gait outside 1/2 island rest and back CGA Then gait to car  01/03/24 Nustep level 5 x 8 minutes, cues to keep going and for bigger motions Bike level 4 x 6 minutes Partial sit ups 3# wate bar hitting ball Walking with HHA x 300 feet in side x 3 Side stepping, fwd and backward walking [employer] kicks Gait out back door in grass to the car with HHA and a lot of cues  12/29/23 NuStep L5 x28mins UBE L4 x52mins each way STS 2x10 Seated punches with 2# 2x10 Hitting ball with 3# WaTe Ball bouncing  Modified sit ups 2x10 Walk outside to car and transfer into car  12/13/23 Nustep level 5 x 5 minutes UBE level 4 x 6 minutes Walking with hand hold assist a lot of cues for posture and step length we did 200 feet x 3 Sit to supine to sit multiple times working on him trying to do all on his  own, did need verbal cues and to get all the way up the second time needed mod A Partial sit ups in sitting Ball bouncing  Walking direction changes  11/22/23 Nustep level 5 x 6 minutes Partial sit ups UBE level 4 x 5 minutes On airex reaching for numbers called out Standing ball toss Gait outside with HHA, a lot of cues and really Min/Mod A due to poor steps today  11/15/23 Nustep level 5 x 6 minutes UBE level 4 x 6 minutes In Pbars side stepping with tband on floor for step length help On airex reaching for numbers on wall 6 toe touches Standing ball toss Passive left shoulder ROM Red tband left shoulder ER/IR row and extension PATIENT EDUCATION: Education details: none new 02/16/23 Person educated: Patient and Spouse Education method: Explanation, Demonstration, Tactile cues, and Verbal cues Education comprehension: verbalized understanding, returned demonstration, verbal cues required, tactile cues required, and needs further education   ------------------------------------------------------------------------------------------------------- (Measures in this section from initial evaluation unless otherwise noted) DIAGNOSTIC FINDINGS:  MRI 2021 with degenerative changes in lumbar spine, L3-4 R subarticular  stenosis COGNITION: Overall cognitive status: History of cognitive impairments - at baseline SENSATION: Not tested COORDINATION: Moderately impaired BLE MUSCLE LENGTH: Hamstrings: severely restricted B Thomas test: Severely restricted B. POSTURE: rounded shoulders, forward head, increased thoracic kyphosis, posterior pelvic tilt, and flexed trunk  LOWER EXTREMITY ROM:   BLE extremely stiff throughout, limited hip ROM in all planes, B knees limited in extension. LOWER EXTREMITY MMT:  3+/5 throughout. Unable to determine accuracy of MMT due to cognition. Functionally demosntrates poor coordinated activation and poor muscular endurance. BED MOBILITY: Min A for Supine<> Sit.  Patient was using a ladder in his hospital bed and could move MI. TRANSFERS: Min A, mod VC, tends to lean back.  11/02/22 independent with use of hands on arm chair with a couple of tries CURB: Min A-Has one step out to his deck. STAIRS: N/A  GAIT: Gait pattern: step to pattern, decreased arm swing- Right, decreased arm swing- Left, decreased step length- Right, decreased step length- Left, shuffling, festinating, trunk flexed, narrow BOS, poor foot clearance- Right, and poor foot clearance- Left Distance walked: 67' Assistive device utilized: Environmental consultant - 2 wheeled Level of assistance: Occasional min A Comments: mod VC to stay close to walker, take longer steps. FUNCTIONAL TESTS:  5 times sit to stand: 38.23  09/29/22  could not performs 5XSTS, 11/02/22 = 47 seconds with cues to use arms, 07/21/23 really struggled today 55 seconds Timed up and go (TUG): 47.91 with RW, 07/21/22 TUG 29  seconds without device, 09/29/22 25 seconds, 11/02/22 TUG 20 seconds, 07/21/23 = 26 seconds no device SBA                                                                     PATIENT EDUCATION: Education details: Progress towards goals, POC Person educated: Patient and Spouse Education method: Programmer, multimedia, Facilities manager, and Verbal cues  Education comprehension: verbalized understanding, returned demonstration, verbal cues required, and needs further education  HOME EXERCISE PROGRAM: PWR! moves  GOALS: Goals reviewed with patient? Yes  SHORT TERM GOALS: Target date: 12/22/22  Perform HEP for PWR! Moves with cues from family. Baseline: Pt's wife, Devere, notes walking and hitting beach ball as main HEP. Goal status: GOAL MET, 12/20/2022   2. Decrease 5 x STS to < 35 sec to demonstrate improved functional strength  Baseline: 47 seconds on 5/21 per note; 34.5 sec UE support 12/20/2022  Goal status: GOAL MET, 12/20/2022   3. Perform sit <> stand transfers from average chair height with Supervision.  Baseline: CGA  12/20/2022;  (01/10/23) CGA  Goal status: NOT MET  LONG TERM GOALS: Target date: 01/13/24  Patient will perform HEP progression with caregiver assist. Baseline: has minimal HEP Goal status: progressing  2.  Decrease 5 x STS to < 25 sec to demonstrate improved functional strength Baseline: 47 seconds on 5/21 per note; (01/10/23) 36 sec w/ cues for speed/sequence>30.38 sec with BUE supprot Goal status: PARTIALLY MET, 01/24/2023  3. Improve quality of gait to be able to ambulate x 100 feet with supervision. Baseline: CGA with cues  Goal status: Met 08/18/23, needing supervision on level surfaces but if a busy environment will need CGA  4.  Patient will perform his bed mobility with MI including rolling, sit<>supine, and scooting in bed.  Baseline: on hard surface mat he is independent but on soft bed at home he struggles, wife reports only needs a hand to get up now 11/02/22; (01/10/23) modified indep.  Goal status: MET  5.  Patient will demonstrate floor<>stand with CGA and verbal cues for patient's wife to be able to assist. Baseline:  Has to call 9-1-1; +2 max assist floor>stand with UE support 01/19/2023 Goal status: GOAL NOT MET 01/19/2023  **UPDATED GOALS BELOW ** per recert 05/17/2023  SHORT TERM GOALS: Target date: 01/25/24   Pt will perform HEP to maintain lower body strength, flexibility, posture with wife's assistance. Baseline:  Goal status: met 04/12/23  2. Pt will perform sit to stand, 8 of 10 reps with BUE support, with supervision, to maintain safety with transfers.  Baseline: min guard  Goal status: 02/07/23-CGA from elevated surface and max VC to lean forward, ongoing  continues to need a lot of cues for hand placement and to lean forward, tends to lean backward  LONG TERM GOALS: Target date: 01/25/24   Patient will perform HEP progression to maintain lower body strength, flexibility, posture with wife's assistance. Baseline:  Goal status: doing some walking with his wife when she feels  safe, has done some pedaling at home as well 12/13/23, working on this , his wife was out of  town and with the heat has not been able to walk much this summer  2.  Pt will ambulate 150-175 ft in 2 MWT with min guard assist, to preserve functional mobility within the home.  Baseline: Goal status:  met 08/25/23  3. Wife will verbalize tips for fall prevention education to minimize fall frequency Baseline:  Goal status:progressing and doing well 4.  Patient will be able to toilet without freezing for over a 4 week period met 11/15/23 5.  Patient will be able to get out of bed from supine with wife assist minimal Assist,met 10/06/23   ASSESSMENT:  CLINICAL IMPRESSION:  Patient and wife reports that the STM we did helps, wife reports that he had less c/o pain with the shoulder.  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   Today we walked a little further than our norm and he really fatigued with this, demonstrated by increased forward trunk lean and with increased shuffling steps.  OBJECTIVE IMPAIRMENTS: Abnormal gait, decreased activity tolerance, decreased balance, decreased cognition, decreased coordination, decreased endurance, decreased mobility, difficulty walking, decreased ROM, decreased strength, decreased safety awareness, impaired flexibility, impaired UE functional use, improper body mechanics, and postural dysfunction.   PLAN:  PT FREQUENCY: 1x/week  PT DURATION: 12 weeks  PLANNED INTERVENTIONS: Therapeutic exercises, Therapeutic activity, Neuromuscular re-education, Balance training, Gait training, Patient/Family education, Self Care, Joint mobilization, Stair training, Moist heat, and Manual therapy  PLAN FOR NEXT SESSION:   Will continue to work with patient to help him and his wife stay living at home and with less difficulty   Patient Details  Name: William [name] MRN: [id number] Date of Birth: Apr 18, 1941 Referring Provider:  Antonio Meth, Jamee SAUNDERS, *

## 2024-02-15 DIAGNOSIS — F0390 Unspecified dementia without behavioral disturbance: Secondary | ICD-10-CM | POA: Diagnosis not present

## 2024-02-15 DIAGNOSIS — D631 Anemia in chronic kidney disease: Secondary | ICD-10-CM | POA: Diagnosis not present

## 2024-02-15 DIAGNOSIS — I679 Cerebrovascular disease, unspecified: Secondary | ICD-10-CM | POA: Diagnosis not present

## 2024-02-15 DIAGNOSIS — N400 Enlarged prostate without lower urinary tract symptoms: Secondary | ICD-10-CM | POA: Diagnosis not present

## 2024-02-15 DIAGNOSIS — E559 Vitamin D deficiency, unspecified: Secondary | ICD-10-CM | POA: Diagnosis not present

## 2024-02-15 DIAGNOSIS — N1832 Chronic kidney disease, stage 3b: Secondary | ICD-10-CM | POA: Diagnosis not present

## 2024-02-15 DIAGNOSIS — N189 Chronic kidney disease, unspecified: Secondary | ICD-10-CM | POA: Diagnosis not present

## 2024-02-15 DIAGNOSIS — I129 Hypertensive chronic kidney disease with stage 1 through stage 4 chronic kidney disease, or unspecified chronic kidney disease: Secondary | ICD-10-CM | POA: Diagnosis not present

## 2024-02-17 ENCOUNTER — Other Ambulatory Visit: Payer: Self-pay | Admitting: Family Medicine

## 2024-02-17 ENCOUNTER — Other Ambulatory Visit (HOSPITAL_COMMUNITY): Payer: Self-pay | Admitting: Psychiatry

## 2024-02-20 ENCOUNTER — Other Ambulatory Visit: Payer: Self-pay

## 2024-02-20 ENCOUNTER — Other Ambulatory Visit (HOSPITAL_BASED_OUTPATIENT_CLINIC_OR_DEPARTMENT_OTHER): Payer: Self-pay

## 2024-02-20 MED ORDER — HYDRALAZINE HCL 25 MG PO TABS
25.0000 mg | ORAL_TABLET | Freq: Three times a day (TID) | ORAL | 1 refills | Status: AC
  Filled 2024-02-20: qty 270, 90d supply, fill #0
  Filled 2024-05-25: qty 270, 90d supply, fill #1

## 2024-02-21 ENCOUNTER — Ambulatory Visit: Admitting: Physical Therapy

## 2024-02-21 ENCOUNTER — Encounter: Payer: Self-pay | Admitting: Physical Therapy

## 2024-02-21 DIAGNOSIS — R262 Difficulty in walking, not elsewhere classified: Secondary | ICD-10-CM

## 2024-02-21 DIAGNOSIS — M6281 Muscle weakness (generalized): Secondary | ICD-10-CM

## 2024-02-21 DIAGNOSIS — R296 Repeated falls: Secondary | ICD-10-CM

## 2024-02-21 DIAGNOSIS — R2689 Other abnormalities of gait and mobility: Secondary | ICD-10-CM

## 2024-02-21 DIAGNOSIS — I69354 Hemiplegia and hemiparesis following cerebral infarction affecting left non-dominant side: Secondary | ICD-10-CM

## 2024-02-21 DIAGNOSIS — M25512 Pain in left shoulder: Secondary | ICD-10-CM

## 2024-02-21 NOTE — Therapy (Signed)
 OUTPATIENT PHYSICAL THERAPY NEURO TREATMENT    Patient Name: William [name] MRN: [id number] DOB:January 31, 1941, 83 y.o., male Today's Date: 02/21/2024  PCP: [name]  DO REFERRING PROVIDER: same   END OF SESSION:  PT End of Session - 02/21/24 1536     Visit Number 91    Date for PT Re-Evaluation 02/26/24    Authorization Type HTA    PT Start Time 1530    PT Stop Time 1613    PT Time Calculation (min) 43 min    Equipment Utilized During Treatment Gait belt    Activity Tolerance Patient tolerated treatment well    Behavior During Therapy WFL for tasks assessed/performed               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   HTN (hypertension) 07/19/2015   Hyperlipidemia    Major neurocognitive disorder 01/09/2014   Mild, related to stroke history   Nocturia    Renal insufficiency 06/25/2013   S/P carotid endarterectomy    BILATERAL ICA--  PATENT PER DUPLEX  05-19-2012   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 09/26/2017   Right calf   Urinary frequency    Vitamin D  deficiency    Past Surgical History:  Procedure Laterality Date   APPENDECTOMY  AS CHILD   CARDIOVASCULAR STRESS TEST  03-27-2012  DR CRENSHAW   LOW RISK LEXISCAN  STUDY-- PROBABLE NORMAL PERFUSION AND SOFT TISSUE ATTENUATION/  NO ISCHEMIA/ EF 51%   CAROTID ENDARTERECTOMY Bilateral LEFT  11-12-2008  DR GREG HAYES   RIGHT ICA  2006  ([facility])   CHOLECYSTECTOMY N/A 02/23/2022   Procedure: LAPAROSCOPIC CHOLECYSTECTOMY;  Surgeon: [name], MD;  Location: [facility] OR;  Service:  General;  Laterality: N/A;   CYSTOSCOPY W/ RETROGRADES Bilateral 06/22/2021   Procedure: CYSTOSCOPY WITH RETROGRADE PYELOGRAM;  Surgeon: [name], MD;  Location: [facility];  Service: Urology;  Laterality: Bilateral;   CYSTOSCOPY WITH LITHOLAPAXY N/A 02/26/2013   Procedure: CYSTOSCOPY WITH LITHOLAPAXY;  Surgeon: Senior Matilda, MD;  Location: [facility];  Service: Urology;  Laterality: N/A;   ENDOSCOPIC RETROGRADE CHOLANGIOPANCREATOGRAPHY (ERCP) WITH PROPOFOL  N/A 02/22/2022   Procedure: ENDOSCOPIC RETROGRADE CHOLANGIOPANCREATOGRAPHY (ERCP) WITH PROPOFOL ;  Surgeon: [name], MD;  Location: [facility] ENDOSCOPY;  Service: Gastroenterology;  Laterality: N/A;   EYE SURGERY  Jan. 2016   cataract surgery both eyes   INGUINAL HERNIA REPAIR Right 11-08-2006   IR KYPHO EA ADDL LEVEL THORACIC OR LUMBAR  02/12/2021   IR RADIOLOGIST EVAL & MGMT  02/18/2021   MASS EXCISION N/A 03/03/2016   Procedure: EXCISION OF BACK  MASS;  Surgeon: [name], MD;  Location: [facility];  Service: General;  Laterality: N/A;   MOHS SURGERY Left 1/ 2016   [name]-- Basal cell   PROSTATE SURGERY     REMOVAL OF STONES  02/22/2022   Procedure: REMOVAL OF STONES;  Surgeon: [name], MD;  Location: [facility] ENDOSCOPY;  Service: Gastroenterology;;   [name]  02/22/2022  Procedure: SPHINCTEROTOMY;  Surgeon: [name], MD;  Location: [facility] ENDOSCOPY;  Service: Gastroenterology;;   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 N/A 06/22/2021   Procedure: TRANSURETHRAL RESECTION OF BLADDER TUMOR;  Surgeon: [name], MD;  Location: [facility];  Service: Urology;  Laterality: N/A;   TRANSURETHRAL RESECTION OF PROSTATE N/A 02/26/2013   Procedure: TRANSURETHRAL RESECTION OF THE PROSTATE WITH GYRUS INSTRUMENTS;  Surgeon: Senior Matilda, MD;  Location: [facility];  Service: Urology;  Laterality: N/A;   TRANSURETHRAL RESECTION OF  PROSTATE N/A 06/22/2021   Procedure: TRANSURETHRAL RESECTION OF THE PROSTATE (TURP);  Surgeon: [name], MD;  Location: [facility];  Service: Urology;  Laterality: N/A;   Patient Active Problem List   Diagnosis Date Noted   Bronchitis 06/16/2023   Impacted cerumen of left ear 06/16/2023   Acute encephalopathy 04/18/2023   Parkinson's disease (HCC) 04/18/2023   History of CVA with residual deficit 04/18/2023   Chronic kidney disease, stage 3b (HCC) 04/18/2023   Dementia with behavioral disturbance (HCC) 04/18/2023   Anemia of chronic disease 04/18/2023   Laceration of right upper extremity 02/24/2023   Dermatitis 02/24/2023   Lung nodules 02/24/2023   Dysuria 08/03/2022   Nonintractable headache 07/01/2022   Bilateral impacted cerumen 06/24/2022   Rash 06/15/2022   Postoperative ileus (HCC) 02/27/2022   Ileus, postoperative (HCC) 02/26/2022   Choledocholithiasis 02/19/2022   DNR (do not resuscitate) 02/19/2022   Left elbow pain 01/26/2022   Mid back pain on left side 01/26/2022   Rib pain 01/26/2022   Acute pain of left shoulder 11/12/2021   Leukocytes in urine 11/12/2021   Urinary frequency 11/12/2021   Thrush 10/08/2021   Hemiplegia, 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   Insomnia    Prediabetes    Acute 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 (HCC)    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   Transaminitis 07/27/2021   UTI (urinary tract infection) 07/27/2021   CVA (cerebral vascular accident) (HCC) 07/27/2021   Fall 07/27/2021   Hyperglycemia 07/27/2021   Cholelithiasis 07/27/2021   Hypoxia 07/27/2021   Nausea and vomiting 07/27/2021   Acute metabolic encephalopathy 07/27/2021   Normocytic anemia 07/27/2021   Chronic back pain 07/27/2021   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   Closed fracture of first lumbar vertebra with routine healing 02/03/2021   Closed  fracture of multiple ribs 11/18/2020   Anxiety 01/29/2020   Leg pain, bilateral 01/29/2020   Ingrown toenail 07/13/2019   Lumbar spondylosis 05/02/2018   Pain in left knee 03/09/2018   Osteoarthritis of left hip 01/16/2018   Trochanteric bursitis of left hip 01/16/2018   Preventative health care 09/26/2017   HTN (hypertension) 07/19/2015   Hyperlipidemia 07/19/2015   Great toe pain 02/11/2014   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   Obesity (BMI 30-39.9) 06/25/2013   Renal insufficiency 06/25/2013   Weakness of left arm 06/25/2013   Sebaceous cyst 03/03/2011   Sprain of lumbar region 07/31/2010   Rib pain, left 08/29/2009   Carotid artery stenosis, asymptomatic, bilateral 05/02/2009   Eczema, atopic 05/31/2008   Vitamin D  deficiency 03/01/2008   BPH (benign prostatic hyperplasia) 08/06/2007   Fasting hyperglycemia 12/21/2006   History of right MCA infarct 2006    ONSET DATE: 06/29/22 REFERRING DIAG:  Diagnosis  I63.9 (ICD-10-CM) - Cerebrovascular accident (CVA), unspecified mechanism (HCC)  W19.XXXD (ICD-10-CM) - Fall, subsequent encounter    THERAPY DIAG:  Muscle weakness (  generalized)  Other abnormalities of gait and mobility  Hemiplegia and hemiparesis following cerebral infarction affecting left non-dominant side (HCC)  Difficulty in walking, not elsewhere classified  Repeated falls  Acute pain of left shoulder  Rationale for Evaluation and Treatment: Rehabilitation  SUBJECTIVE:                                                                                                                                                                                         SUBJECTIVE STATEMENT:  Sees the shoulder MD tomorrow.  He did have a fall in the bedroom last week, c/o some thigh pain, wife asked me to look at a lift schair for if he is on the floor  PERTINENT HISTORY:  William [name] is a 83 year old man with dementia, CKD, HTN, CAD, HLD and history of  CVA  (2006, 2023), Parkinson's  PAIN:  Are you having pain? Faces/behavior scale- mild pain BLEs  and back   PRECAUTIONS: Fall  WEIGHT BEARING RESTRICTIONS: No  FALLS: Has patient fallen in last 6 months? No  LIVING ENVIRONMENT: Lives with: lives with their family and lives with their spouse Lives in: House/apartment Stairs: 1 brick high step Has following equipment at home: Environmental consultant - 2 wheeled, Wheelchair (manual), Shower bench, bed side commode, Grab bars, and hospital bed, sliding pad for car  PLOF: Independent with basic ADLs and Independent with household mobility without device  PATIENT GOALS: Walk around the block with his wife. Be able to use the dining room chair rather than have to use the W/C. Increased I with bed mobility and simple tasks at home like make some coffee or brush his teeth, Step over tub to shower, has bench, but he can't really use it. Up and down the one step to get to back deck. 11/23/22: Patient's wife William [name] updated his goals today: Walk around store (gets distracted), swing a golf club again (thinking of driving range), get up on his own, walk without leaning forward, more recognition of the left side (from CVA), not need gait belt, OT-- be able to get dressed more on his own, navigate the newspaper.  OBJECTIVE:  TREATMENT: 02/21/24 Nustep level 5 x 9 minutes UBE level 3 x 5 minutes Worked with wife on the lift from floor chair, is this feasible and possible for William [name] to use.  I worked on trying to simulate this, with him having to sit up from supine with him going to long sitting.  Difficult for him due to tight HS, then can he push himself up onto a small seating surface, again very difficulty for him as he was falling backward Passive  HS stretches Seated PWR moves and instructed his wife with these Gait out back door through the grass to the car  02/14/24 Nustep level 5 x 6 minutes Bike level 4 x 5 minutes UBE level 4 x 5 minutes STM to the left shoulder  area mainly upper trap Some genlte PROM of the left shoulder Gait with HHA out the back door around one of the islands on the side of the back building back up the hill to the picnic table and rest and then through the grass on the side of the building to the front door, this was very fatiguing for him, a lot of cues verbal and physical to get him to stand up Gait out to the car  02/07/24 Nustep level 5 x 6 minutes Bike level 4 x 5 minutes STM to the left upper trap and rhomboid Passive ROM of the left shoulder Seated ball bouncing, some trying to get him to use the left hand Gait outside around the parking William [name] with one rest sitting and onto the front door with one rest standing.   Gait inside 400 ' without rest but with CGA  01/31/24 Nustep level 5 x 6 minutes Bike level 5 x 5 minutes STM to the left shoulder PROM left shoulder 2.5# standing hip abduction Standing ball catch/bounce 2.5# marches 2.5# LAQ Seated ball catch/bounce Partial sit ups Gait HHA 3x 200 feet   01/26/24 Nustep level 5 x 8 minutes Bike level 4 x 5 minutes some verbal and tactile cues to keep upright [employer] kicks Side stepping Backward walking 200 feet walking x 2   01/17/24 NuStep L5x69mins UBE L2 x36mins each way   Alternating punches 2# 20 reps alt x2 Chest press 3# WaTE 2x15 Standing 2# shoulder flexion alternating  Ball bouncing and hitting while seated  Modified sit ups 2x10   01/10/24 Nustep level 5 x 8 minutes Bike level 5 x 6 minutes Seated ball bounces Standing ball bounce UBE level 4 x 4 minutes backwards hurts left shoulder Partial sit ups Gait outside 1/2 island rest and back CGA Then gait to car  01/03/24 Nustep level 5 x 8 minutes, cues to keep going and for bigger motions Bike level 4 x 6 minutes Partial sit ups 3# wate bar hitting ball Walking with HHA x 300 feet in side x 3 Side stepping, fwd and backward walking [employer] kicks Gait out back door  in grass to the car with HHA and a lot of cues  12/29/23 NuStep L5 x26mins UBE L4 x15mins each way STS 2x10 Seated punches with 2# 2x10 Hitting ball with 3# WaTe Ball bouncing  Modified sit ups 2x10 Walk outside to car and transfer into car  12/13/23 Nustep level 5 x 5 minutes UBE level 4 x 6 minutes Walking with hand hold assist a lot of cues for posture and step length we did 200 feet x 3 Sit to supine to sit multiple times working on him trying to do all on his own, did need verbal cues and to get all the way up the second time needed mod A Partial sit ups in sitting Ball bouncing  Walking direction changes  11/22/23 Nustep level 5 x 6 minutes Partial sit ups UBE level 4 x 5 minutes On airex reaching for numbers called out Standing ball toss Gait outside with HHA, a lot of cues and really Min/Mod A due to poor steps today  11/15/23 Nustep level 5 x 6 minutes UBE  level 4 x 6 minutes In Pbars side stepping with tband on floor for step length help On airex reaching for numbers on wall 6 toe touches Standing ball toss Passive left shoulder ROM Red tband left shoulder ER/IR row and extension PATIENT EDUCATION: Education details: none new 02/16/23 Person educated: Patient and Spouse Education method: Explanation, Demonstration, Tactile cues, and Verbal cues Education comprehension: verbalized understanding, returned demonstration, verbal cues required, tactile cues required, and needs further education   ------------------------------------------------------------------------------------------------------- (Measures in this section from initial evaluation unless otherwise noted) DIAGNOSTIC FINDINGS:  MRI 2021 with degenerative changes in lumbar spine, L3-4 R subarticular stenosis COGNITION: Overall cognitive status: History of cognitive impairments - at baseline SENSATION: Not tested COORDINATION: Moderately impaired BLE MUSCLE LENGTH: Hamstrings: severely restricted  B Thomas test: Severely restricted B. POSTURE: rounded shoulders, forward head, increased thoracic kyphosis, posterior pelvic tilt, and flexed trunk  LOWER EXTREMITY ROM:   BLE extremely stiff throughout, limited hip ROM in all planes, B knees limited in extension. LOWER EXTREMITY MMT:  3+/5 throughout. Unable to determine accuracy of MMT due to cognition. Functionally demosntrates poor coordinated activation and poor muscular endurance. BED MOBILITY: Min A for Supine<> Sit. Patient was using a ladder in his hospital bed and could move MI. TRANSFERS: Min A, mod VC, tends to lean back.  11/02/22 independent with use of hands on arm chair with a couple of tries CURB: Min A-Has one step out to his deck. STAIRS: N/A  GAIT: Gait pattern: step to pattern, decreased arm swing- Right, decreased arm swing- Left, decreased step length- Right, decreased step length- Left, shuffling, festinating, trunk flexed, narrow BOS, poor foot clearance- Right, and poor foot clearance- Left Distance walked: 91' Assistive device utilized: Environmental consultant - 2 wheeled Level of assistance: Occasional min A Comments: mod VC to stay close to walker, take longer steps. FUNCTIONAL TESTS:  5 times sit to stand: 38.23  09/29/22  could not performs 5XSTS, 11/02/22 = 47 seconds with cues to use arms, 07/21/23 really struggled today 55 seconds Timed up and go (TUG): 47.91 with RW, 07/21/22 TUG 29  seconds without device, 09/29/22 25 seconds, 11/02/22 TUG 20 seconds, 07/21/23 = 26 seconds no device SBA                                                                     PATIENT EDUCATION: Education details: Progress towards goals, POC Person educated: Patient and Spouse Education method: Programmer, multimedia, Facilities manager, and Verbal cues  Education comprehension: verbalized understanding, returned demonstration, verbal cues required, and needs further education  HOME EXERCISE PROGRAM: PWR! moves  GOALS: Goals reviewed with patient? Yes  SHORT TERM  GOALS: Target date: 12/22/22  Perform HEP for PWR! Moves with cues from family. Baseline: Pt's wife, William [name], notes walking and hitting beach ball as main HEP. Goal status: GOAL MET, 12/20/2022   2. Decrease 5 x STS to < 35 sec to demonstrate improved functional strength  Baseline: 47 seconds on 5/21 per note; 34.5 sec UE support 12/20/2022  Goal status: GOAL MET, 12/20/2022   3. Perform sit <> stand transfers from average chair height with Supervision.  Baseline: CGA  12/20/2022; (01/10/23) CGA  Goal status: NOT MET  LONG TERM GOALS: Target date: 01/13/24  Patient will perform HEP  progression with caregiver assist. Baseline: has minimal HEP Goal status: progressing  2.  Decrease 5 x STS to < 25 sec to demonstrate improved functional strength Baseline: 47 seconds on 5/21 per note; (01/10/23) 36 sec w/ cues for speed/sequence>30.38 sec with BUE supprot Goal status: PARTIALLY MET, 01/24/2023  3. Improve quality of gait to be able to ambulate x 100 feet with supervision. Baseline: CGA with cues  Goal status: Met 08/18/23, needing supervision on level surfaces but if a busy environment will need CGA  4.  Patient will perform his bed mobility with MI including rolling, sit<>supine, and scooting in bed. Baseline: on hard surface mat he is independent but on soft bed at home he struggles, wife reports only needs a hand to get up now 11/02/22; (01/10/23) modified indep.  Goal status: MET  5.  Patient will demonstrate floor<>stand with CGA and verbal cues for patient's wife to be able to assist. Baseline:  Has to call 9-1-1; +2 max assist floor>stand with UE support 01/19/2023 Goal status: GOAL NOT MET 01/19/2023  **UPDATED GOALS BELOW ** per recert 05/17/2023  SHORT TERM GOALS: Target date: 01/25/24   Pt will perform HEP to maintain lower body strength, flexibility, posture with wife's assistance. Baseline:  Goal status: met 04/12/23  2. Pt will perform sit to stand, 8 of 10 reps with BUE support, with  supervision, to maintain safety with transfers.  Baseline: min guard  Goal status: 02/07/23-CGA from elevated surface and max VC to lean forward, ongoing  continues to need a lot of cues for hand placement and to lean forward, tends to lean backward  LONG TERM GOALS: Target date: 01/25/24   Patient will perform HEP progression to maintain lower body strength, flexibility, posture with wife's assistance. Baseline:  Goal status: doing some walking with his wife when she feels safe, has done some pedaling at home as well 12/13/23, working on this , his wife was out of  town and with the heat has not been able to walk much this summer  2.  Pt will ambulate 150-175 ft in 2 MWT with min guard assist, to preserve functional mobility within the home.  Baseline: Goal status:  met 08/25/23  3. Wife will verbalize tips for fall prevention education to minimize fall frequency Baseline:  Goal status:progressing and doing well 4.  Patient will be able to toilet without freezing for over a 4 week period met 11/15/23 5.  Patient will be able to get out of bed from supine with wife assist minimal Assist,met 10/06/23   ASSESSMENT:  CLINICAL IMPRESSION:  Patient will see the shoulder MD tomorrow.  Wife reports a fall over the weekend.  She asked me to look at a lift from the floor chair on line, we tried to simulate the things that he would have to do to get in the chair and he really struggled with this as documented above.  Wife reports that they charge her over $350 to come to the house to lift him up.  The chair is $250.  I am not sure he can use it yet OBJECTIVE IMPAIRMENTS: Abnormal gait, decreased activity tolerance, decreased balance, decreased cognition, decreased coordination, decreased endurance, decreased mobility, difficulty walking, decreased ROM, decreased strength, decreased safety awareness, impaired flexibility, impaired UE functional use, improper body mechanics, and postural dysfunction.    PLAN:  PT FREQUENCY: 1x/week  PT DURATION: 12 weeks  PLANNED INTERVENTIONS: Therapeutic exercises, Therapeutic activity, Neuromuscular re-education, Balance training, Gait training, Patient/Family education, Self Care, Joint  mobilization, Stair training, Moist heat, and Manual therapy  PLAN FOR NEXT SESSION:   Will continue to work with patient to help him and his wife stay living at home and with less difficulty   Patient Details  Name: William [name] MRN: [id number] Date of Birth: 1940-10-28 Referring Provider:  Antonio Meth, Jamee SAUNDERS, *

## 2024-02-22 DIAGNOSIS — M25512 Pain in left shoulder: Secondary | ICD-10-CM | POA: Diagnosis not present

## 2024-02-27 NOTE — Therapy (Signed)
 OUTPATIENT PHYSICAL THERAPY NEURO TREATMENT    Patient Name: William [name] MRN: [id number] DOB:05/01/1941, 83 y.o., male Today's Date: 02/28/2024  PCP: [name]  DO REFERRING PROVIDER: same   END OF SESSION:  PT End of Session - 02/28/24 1625     Visit Number 92    Date for PT Re-Evaluation 04/10/24    Authorization Type HTA    PT Start Time 1630    PT Stop Time 1715    PT Time Calculation (min) 45 min    Equipment Utilized During Treatment Gait belt    Activity Tolerance Patient tolerated treatment well    Behavior During Therapy WFL for tasks assessed/performed                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   HTN (hypertension) 07/19/2015   Hyperlipidemia    Major neurocognitive disorder 01/09/2014   Mild, related to stroke history   Nocturia    Renal insufficiency 06/25/2013   S/P carotid endarterectomy    BILATERAL ICA--  PATENT PER DUPLEX  05-19-2012   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 09/26/2017   Right calf   Urinary frequency    Vitamin D  deficiency    Past Surgical History:  Procedure Laterality Date   APPENDECTOMY  AS CHILD   CARDIOVASCULAR STRESS TEST  03-27-2012  DR CRENSHAW   LOW RISK LEXISCAN  STUDY-- PROBABLE NORMAL PERFUSION AND SOFT TISSUE ATTENUATION/  NO ISCHEMIA/ EF 51%   CAROTID ENDARTERECTOMY Bilateral LEFT  11-12-2008  DR GREG HAYES   RIGHT ICA  2006  ([facility])   CHOLECYSTECTOMY N/A 02/23/2022   Procedure: LAPAROSCOPIC CHOLECYSTECTOMY;  Surgeon: [name], MD;  Location: [facility] OR;  Service:  General;  Laterality: N/A;   CYSTOSCOPY W/ RETROGRADES Bilateral 06/22/2021   Procedure: CYSTOSCOPY WITH RETROGRADE PYELOGRAM;  Surgeon: [name], MD;  Location: [facility];  Service: Urology;  Laterality: Bilateral;   CYSTOSCOPY WITH LITHOLAPAXY N/A 02/26/2013   Procedure: CYSTOSCOPY WITH LITHOLAPAXY;  Surgeon: Senior Matilda, MD;  Location: [facility];  Service: Urology;  Laterality: N/A;   ENDOSCOPIC RETROGRADE CHOLANGIOPANCREATOGRAPHY (ERCP) WITH PROPOFOL  N/A 02/22/2022   Procedure: ENDOSCOPIC RETROGRADE CHOLANGIOPANCREATOGRAPHY (ERCP) WITH PROPOFOL ;  Surgeon: [name], MD;  Location: [facility] ENDOSCOPY;  Service: Gastroenterology;  Laterality: N/A;   EYE SURGERY  Jan. 2016   cataract surgery both eyes   INGUINAL HERNIA REPAIR Right 11-08-2006   IR KYPHO EA ADDL LEVEL THORACIC OR LUMBAR  02/12/2021   IR RADIOLOGIST EVAL & MGMT  02/18/2021   MASS EXCISION N/A 03/03/2016   Procedure: EXCISION OF BACK  MASS;  Surgeon: [name], MD;  Location: [facility];  Service: General;  Laterality: N/A;   MOHS SURGERY Left 1/ 2016   [name]-- Basal cell   PROSTATE SURGERY     REMOVAL OF STONES  02/22/2022   Procedure: REMOVAL OF STONES;  Surgeon: [name], MD;  Location: [facility] ENDOSCOPY;  Service: Gastroenterology;;   [name]  02/22/2022   Procedure: SPHINCTEROTOMY;  Surgeon: [name], MD;  Location: [facility];  Service: Gastroenterology;;   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 N/A 06/22/2021   Procedure: TRANSURETHRAL RESECTION OF BLADDER TUMOR;  Surgeon: [name], MD;  Location: [facility];  Service: Urology;  Laterality: N/A;   TRANSURETHRAL RESECTION OF PROSTATE N/A 02/26/2013   Procedure: TRANSURETHRAL RESECTION OF THE PROSTATE WITH GYRUS INSTRUMENTS;  Surgeon: Senior Matilda, MD;  Location: [facility];  Service: Urology;  Laterality: N/A;   TRANSURETHRAL RESECTION OF  PROSTATE N/A 06/22/2021   Procedure: TRANSURETHRAL RESECTION OF THE PROSTATE (TURP);  Surgeon: [name], MD;  Location: [facility];  Service: Urology;  Laterality: N/A;   Patient Active Problem List   Diagnosis Date Noted   Bronchitis 06/16/2023   Impacted cerumen of left ear 06/16/2023   Acute encephalopathy 04/18/2023   Parkinson's disease (HCC) 04/18/2023   History of CVA with residual deficit 04/18/2023   Chronic kidney disease, stage 3b (HCC) 04/18/2023   Dementia with behavioral disturbance (HCC) 04/18/2023   Anemia of chronic disease 04/18/2023   Laceration of right upper extremity 02/24/2023   Dermatitis 02/24/2023   Lung nodules 02/24/2023   Dysuria 08/03/2022   Nonintractable headache 07/01/2022   Bilateral impacted cerumen 06/24/2022   Rash 06/15/2022   Postoperative ileus (HCC) 02/27/2022   Ileus, postoperative (HCC) 02/26/2022   Choledocholithiasis 02/19/2022   DNR (do not resuscitate) 02/19/2022   Left elbow pain 01/26/2022   Mid back pain on left side 01/26/2022   Rib pain 01/26/2022   Acute pain of left shoulder 11/12/2021   Leukocytes in urine 11/12/2021   Urinary frequency 11/12/2021   Thrush 10/08/2021   Hemiplegia, 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   Insomnia    Prediabetes    Acute 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 (HCC)    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   Transaminitis 07/27/2021   UTI (urinary tract infection) 07/27/2021   CVA (cerebral vascular accident) (HCC) 07/27/2021   Fall 07/27/2021   Hyperglycemia 07/27/2021   Cholelithiasis 07/27/2021   Hypoxia 07/27/2021   Nausea and vomiting 07/27/2021   Acute metabolic encephalopathy 07/27/2021   Normocytic anemia 07/27/2021   Chronic back pain 07/27/2021   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   Closed fracture of first lumbar vertebra with routine healing 02/03/2021   Closed  fracture of multiple ribs 11/18/2020   Anxiety 01/29/2020   Leg pain, bilateral 01/29/2020   Ingrown toenail 07/13/2019   Lumbar spondylosis 05/02/2018   Pain in left knee 03/09/2018   Osteoarthritis of left hip 01/16/2018   Trochanteric bursitis of left hip 01/16/2018   Preventative health care 09/26/2017   HTN (hypertension) 07/19/2015   Hyperlipidemia 07/19/2015   Great toe pain 02/11/2014   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   Obesity (BMI 30-39.9) 06/25/2013   Renal insufficiency 06/25/2013   Weakness of left arm 06/25/2013   Sebaceous cyst 03/03/2011   Sprain of lumbar region 07/31/2010   Rib pain, left 08/29/2009   Carotid artery stenosis, asymptomatic, bilateral 05/02/2009   Eczema, atopic 05/31/2008   Vitamin D  deficiency 03/01/2008   BPH (benign prostatic hyperplasia) 08/06/2007   Fasting hyperglycemia 12/21/2006   History of right MCA infarct 2006    ONSET DATE: 06/29/22 REFERRING DIAG:  Diagnosis  I63.9 (ICD-10-CM) - Cerebrovascular accident (CVA), unspecified mechanism (HCC)  W19.XXXD (ICD-10-CM) - Fall, subsequent encounter    THERAPY DIAG:  Muscle weakness (generalized)  Other abnormalities of gait and mobility  Hemiplegia and hemiparesis following cerebral infarction affecting left non-dominant side (HCC)  Difficulty in walking, not elsewhere classified  History of falling  Repeated falls  Acute pain of left shoulder  Rationale for Evaluation and Treatment: Rehabilitation  SUBJECTIVE:                                                                                                                                                                                         SUBJECTIVE STATEMENT:  Doing alright, no falls per wife. Went to shoulder doctor and was given a cortisone shot.   PERTINENT HISTORY:  OTHA MONICAL is a 83 year old man with dementia, CKD, HTN, CAD, HLD and history of CVA  (2006, 2023), Parkinson's  PAIN:  Are you  having pain? Faces/behavior scale- mild pain BLEs  and back   PRECAUTIONS: Fall  WEIGHT BEARING RESTRICTIONS: No  FALLS: Has patient fallen in last 6 months? No  LIVING ENVIRONMENT: Lives with: lives with their family and lives with their spouse Lives in: House/apartment Stairs: 1 brick high step Has following equipment at home: Environmental consultant - 2 wheeled, Wheelchair (manual), Shower bench, bed side commode, Grab bars, and hospital bed, sliding pad for car  PLOF: Independent with basic ADLs and Independent with household mobility without device  PATIENT GOALS: Walk around the block with his wife. Be able to use the dining room chair rather than have to use the W/C. Increased I with bed mobility and simple tasks at home like make some coffee or brush his teeth, Step over tub to shower, has bench, but he can't really use it. Up and down the one step to get to back deck. 11/23/22: Patient's wife Devere updated his goals today: Walk around store (gets distracted), swing a golf club again (thinking of driving range), get up on his own, walk without leaning forward, more recognition of the left side (from CVA), not need gait belt, OT-- be able to get dressed more on his own, navigate the newspaper.  OBJECTIVE:  TREATMENT: 02/28/24 NuStep L5x77mins UBE L2 x30mins each way  Seated PWR moves- holding big red ball touching floor and reaching overhead, rotations holding big red ball Punches with 2# weights  LAQ 5# 2x12 Modified sit ups 2x10  02/21/24 Nustep level 5 x 9 minutes UBE level 3 x 5 minutes Worked with wife on the lift from floor chair, is this feasible and possible for [place] to use.  I worked on trying to simulate this, with him having to sit up from supine with him going to long sitting.  Difficult for him  due to tight HS, then can he push himself up onto a small seating surface, again very difficulty for him as he was falling backward Passive HS stretches Seated PWR moves and instructed his wife  with these Gait out back door through the grass to the car  02/14/24 Nustep level 5 x 6 minutes Bike level 4 x 5 minutes UBE level 4 x 5 minutes STM to the left shoulder area mainly upper trap Some genlte PROM of the left shoulder Gait with HHA out the back door around one of the islands on the side of the back building back up the hill to the picnic table and rest and then through the grass on the side of the building to the front door, this was very fatiguing for him, a lot of cues verbal and physical to get him to stand up Gait out to the car  PATIENT EDUCATION: Education details: none new 02/16/23 Person educated: Patient and Spouse Education method: Explanation, Demonstration, Tactile cues, and Verbal cues Education comprehension: verbalized understanding, returned demonstration, verbal cues required, tactile cues required, and needs further education   ------------------------------------------------------------------------------------------------------- (Measures in this section from initial evaluation unless otherwise noted) DIAGNOSTIC FINDINGS:  MRI 2021 with degenerative changes in lumbar spine, L3-4 R subarticular stenosis COGNITION: Overall cognitive status: History of cognitive impairments - at baseline SENSATION: Not tested COORDINATION: Moderately impaired BLE MUSCLE LENGTH: Hamstrings: severely restricted B Thomas test: Severely restricted B. POSTURE: rounded shoulders, forward head, increased thoracic kyphosis, posterior pelvic tilt, and flexed trunk  LOWER EXTREMITY ROM:   BLE extremely stiff throughout, limited hip ROM in all planes, B knees limited in extension. LOWER EXTREMITY MMT:  3+/5 throughout. Unable to determine accuracy of MMT due to cognition. Functionally demosntrates poor coordinated activation and poor muscular endurance. BED MOBILITY: Min A for Supine<> Sit. Patient was using a ladder in his hospital bed and could move MI. TRANSFERS: Min A, mod VC,  tends to lean back.  11/02/22 independent with use of hands on arm chair with a couple of tries CURB: Min A-Has one step out to his deck. STAIRS: N/A  GAIT: Gait pattern: step to pattern, decreased arm swing- Right, decreased arm swing- Left, decreased step length- Right, decreased step length- Left, shuffling, festinating, trunk flexed, narrow BOS, poor foot clearance- Right, and poor foot clearance- Left Distance walked: 38' Assistive device utilized: Environmental consultant - 2 wheeled Level of assistance: Occasional min A Comments: mod VC to stay close to walker, take longer steps. FUNCTIONAL TESTS:  5 times sit to stand: 38.23  09/29/22  could not performs 5XSTS, 11/02/22 = 47 seconds with cues to use arms, 07/21/23 really struggled today 55 seconds Timed up and go (TUG): 47.91 with RW, 07/21/22 TUG 29  seconds without device, 09/29/22 25 seconds, 11/02/22 TUG 20 seconds, 07/21/23 = 26 seconds no device SBA                                                                     PATIENT EDUCATION: Education details: Progress towards goals, POC Person educated: Patient and Spouse Education method: Programmer, multimedia, Facilities manager, and Verbal cues  Education comprehension: verbalized understanding, returned demonstration, verbal cues required, and needs further education  HOME EXERCISE PROGRAM: PWR! moves  GOALS:  SHORT TERM  GOALS: Target date: 01/25/24   Pt will perform HEP to maintain lower body strength, flexibility, posture with wife's assistance. Baseline:  Goal status: met 04/12/23  2. Pt will perform sit to stand, 8 of 10 reps with BUE support, with supervision, to maintain safety with transfers.  Baseline: min guard  Goal status: 02/07/23-CGA from elevated surface and max VC to lean forward, ongoing  continues to need a lot of cues for hand placement and to lean forward, tends to lean backward, needs cues and some assistance 02/28/24  LONG TERM GOALS: Target date: 04/10/24   Patient will perform HEP  progression to maintain lower body strength, flexibility, posture with wife's assistance. Baseline:  Goal status: doing some walking with his wife when she feels safe, has done some pedaling at home as well 12/13/23, working on this , his wife was out of  town and with the heat has not been able to walk much this summer, ongoing 02/28/24  2.  Pt will ambulate 150-175 ft in 2 MWT with min guard assist, to preserve functional mobility within the home.  Baseline: Goal status:  met 08/25/23  3. Wife will verbalize tips for fall prevention education to minimize fall frequency Baseline:  Goal status:progressing and doing well 4.  Patient will be able to toilet without freezing for over a 4 week period met 11/15/23 5.  Patient will be able to get out of bed from supine with wife assist minimal Assist,met 10/06/23   ASSESSMENT:  CLINICAL IMPRESSION:  Patient returns with no new changes. He went to the doctor for his shoulder, they gave him a cortisone shot. Also did an X-ray and thinks he may have bursitis and some arthritis that was not there before. Pt states the shoulder is not hurting anymore. He had some leaning back towards end of session, had to put a wedge to prevent from falling back. This is a little late in the day for him so he seemed to have more trouble concentrating in session today.   OBJECTIVE IMPAIRMENTS: Abnormal gait, decreased activity tolerance, decreased balance, decreased cognition, decreased coordination, decreased endurance, decreased mobility, difficulty walking, decreased ROM, decreased strength, decreased safety awareness, impaired flexibility, impaired UE functional use, improper body mechanics, and postural dysfunction.   PLAN:  PT FREQUENCY: 1x/week  PT DURATION: 12 weeks  PLANNED INTERVENTIONS: Therapeutic exercises, Therapeutic activity, Neuromuscular re-education, Balance training, Gait training, Patient/Family education, Self Care, Joint mobilization, Stair training,  Moist heat, and Manual therapy  PLAN FOR NEXT SESSION:   Will continue to work with patient to help him and his wife stay living at home and with less difficulty   Patient Details  Name: YASHUA BRACCO MRN: [id number] Date of Birth: Nov 20, 1940 Referring Provider:  Antonio Meth, Jamee SAUNDERS, *

## 2024-02-28 ENCOUNTER — Ambulatory Visit

## 2024-02-28 DIAGNOSIS — R2689 Other abnormalities of gait and mobility: Secondary | ICD-10-CM

## 2024-02-28 DIAGNOSIS — I69354 Hemiplegia and hemiparesis following cerebral infarction affecting left non-dominant side: Secondary | ICD-10-CM

## 2024-02-28 DIAGNOSIS — R296 Repeated falls: Secondary | ICD-10-CM

## 2024-02-28 DIAGNOSIS — Z9181 History of falling: Secondary | ICD-10-CM

## 2024-02-28 DIAGNOSIS — M6281 Muscle weakness (generalized): Secondary | ICD-10-CM | POA: Diagnosis not present

## 2024-02-28 DIAGNOSIS — M25512 Pain in left shoulder: Secondary | ICD-10-CM

## 2024-02-28 DIAGNOSIS — R262 Difficulty in walking, not elsewhere classified: Secondary | ICD-10-CM

## 2024-03-02 ENCOUNTER — Other Ambulatory Visit: Payer: Self-pay

## 2024-03-02 ENCOUNTER — Other Ambulatory Visit (HOSPITAL_BASED_OUTPATIENT_CLINIC_OR_DEPARTMENT_OTHER): Payer: Self-pay

## 2024-03-02 ENCOUNTER — Other Ambulatory Visit: Payer: Self-pay | Admitting: Family Medicine

## 2024-03-02 MED ORDER — CLOPIDOGREL BISULFATE 75 MG PO TABS
75.0000 mg | ORAL_TABLET | Freq: Every day | ORAL | 1 refills | Status: AC
  Filled 2024-03-02: qty 90, 90d supply, fill #0
  Filled 2024-06-01: qty 90, 90d supply, fill #1

## 2024-03-02 MED ORDER — VITAMIN B-12 1000 MCG PO TABS
1000.0000 ug | ORAL_TABLET | Freq: Every day | ORAL | 1 refills | Status: AC
  Filled 2024-03-02: qty 90, 90d supply, fill #0
  Filled 2024-05-25: qty 90, 90d supply, fill #1

## 2024-03-05 NOTE — Progress Notes (Deleted)
 NEUROLOGY FOLLOW UP OFFICE NOTE  William [name] [id number]  Assessment/Plan:     1.  Parkinson's disease 2.   Major vascular neurocognitive disorder, but cannot rule out underlying dementia secondary to Parkinson's disease as well 3.   Recurrent spells of altered awareness - unclear if seizures or behavioral.  He is already on carbamazepine  for mood.      Carbidopa -levodopa  IR 25/100mg  to 1.5 tablest three times daily.  I query if trouble sitting down on the toilet in the morning may be freezing.  Therefore, I recommended giving the morning dose about 30 minutes before getting out of bed.  May take with crackers or bread.  Regarding sundowning, I think increasing carbamazepine  would be an appropriate course of action rather than increasing a benzodiazepine or  Continue home exercises and maintenance PT once a week Regarding altered awareness, continue to monitor.  Plan may be to increase carbamazepine  anyway.  As long as they remain brief and infrequent, I wouldn't start a new medication or follow up with another EEG.   Wife plans to follow up with PCP regarding possible UTI Follow up in 6 months.  Total time spent in chart and face to face with patient:  49 minutes.     Subjective:  William [name] is a 83 year old man with dementia, CKD, HTN, CAD, HLD and history of CVA who follows up for vascular dementia, cerebrovascular disease with history of stroke and parkinsonism.  UPDATE: Current medications:  Carbidopa -levodopa  IR 25/100mg  1.5 tablet three times daily (9-10 AM, 12-2 PM and 5-6 PM), ASA 81mg , Plavix  75mg ; Norvasc , hydralazine , fenofibrate  160mg , carbamazepine  5mL (100mg ) morning and afternoon and 10mL (200mg ) at night; amantadine  100mg  daily, lorazepam  PRN; tizanidine  4mg  Q6h PRN, hydralazine , trazodone  75mg  at bedtime  On 04/19/2023, he was admitted to [facility] with slurred speech, dizziness, diaphoresis and appeared to be leaning to the right.  Exam in hospital revealed  chronic left sided facial droop but no new focal abnormalities.  Found to have a UTI, which has been cause for confusion in the past and given dose of IV Rocephin  and discharged on Keflex .  MRI of brain showed known multiple old infarcts in the right MCA territory but was negative for acute stroke or other acute abnormality.  EEG showed right frontotemporal slowing secondary to prior stroke but no epileptiform discharges or electrographic seizures.  B12 level was low at 203 and started on supplementation.  He had another episode of confusion due to refractory UTI on 11/16.  He returned to the ED on 11/26 for episode of syncope.  EKG was unremarkable and he was given IV fluids and referred to cardiology.    He has been more agitated for the past 3 days.  Smell of his urine has been pungent.  His wife wonders if he has another UTI.  He has been keeping hydrated except for yesterday.  Ambulates with assistance.  Goes to [employer] where he is part of a walking club.  He sometimes has periods where he had excessive drooling  Denies difficulty swallowing.  When he first gets up to go the bathroom in the morning, he doesn't sit down on the toilet seat.  He seams to slightly hover over the seat with legs slightly bent.  It doesn't seem to happen when he goes to the bathroom later in the day.    He has agitation at night, making it difficult to be put to bed.  He is on carbamazepine  and his  psychiatrist suggested increasing dose.    He recently had another episode of unresponsiveness.  He will be sitting when suddenly his eyes close and his face droops bilaterally.  Not responding.  No convulsions.  Lasts a minute.  May be confused for just a couple of minutes.  Occurs every 2 to 3 months.  With most recent event, blood pressure was elevated, not low.  Blood sugar was normal.     HISTORY: Parkinson's disease vs vascular dementia: He had a stroke in 2006, after experiencing left facial droop, left arm and leg  weakness, as well slurred vision and vision problems.  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  He has some residual left sided weakness and facial weakness.  He underwent left carotid endarterectomy in 2010 for asymptomatic stenosis.   Beginning around 2013, he and his wife have noted a gradual progression and cognitive deficits. Onset correlated during a period when he was experiencing dizziness and syncope secondary to orthostatic hypotension. At that time, he was dehydrated and not drinking much fluids. He was instructed to start increasing his fluid intake as well as his sodium intake. Now, he tries to drink 3 bottles of water  per day. He particularly notes confusion regarding how to perform certain everyday tasks. For example, it takes him a long time to unload the dishwasher because he has trouble figuring out where to put the dishes. He use to enjoy cooking and preparing meals. Now he has difficulty cooking and can only see things up in the microwave. He also has been experiencing dressing apraxia. Sometimes he will put both his feet into one leg of his pants. Other times, he has difficulty buttoning his shirts. He denies any language dysfunction such as difficulty understanding other people or other people not understanding him. He has no trouble reading or writing. His short-term and long-term memory are pretty good. He denies any difficulty with face recognition. He denies hallucinations or delusions. He has not had any change in his personality or behavior. He still drives but very seldom and only locally. He has not had any problems with accidents or near accidents, but he says he drives slowly because he is very nervous and cautious. He does have history of anxiety and often shakes when he gets nervous. He denies any family history of dementia. However, there is a psychiatric history with his mother and sister.  He denies history of alcohol abuse or drug use.    He underwent neuropsychological testing on 10/25/13 with Dr. Stephania Hun at [facility]  Testing suggested mild dementia due to his stroke, but also underlying neurodegenerative process.  Memory and left hemispheric medial temporal functions were intact.  Findings showed deficits in visual processing and executive dysfunction.  Onset of visual-spatial deficits and apraxia may suggest occipital lobe involvement, such as due to an Alzheimer's variant called posterior cortical atrophy.  MRI from May 2015 revealed global atrophy but it does not seem more prominent in the occipital lobes.   He developed increased confusion in February 2021.  At physical therapy, he was noted to have increased difficulty using his left side (affected side from his stroke).  He had difficulty performing finger-thumb tapping and had trouble gripping the handle of the exercise machine with his left hand.  He also has had acute cognitive changes.  He started having trouble dialing his daughter's phone number which is new.  MRI of brain without contrast performed on 08/15/2019 was personally reviewed and showed  remote large right hemispheric stroke and chronic small vessel ischemic changes, stable compared to prior MRI from 2015, no acute intracranial abnormality.   He underwent repeat neuropsychological testing on 11/29/2019 which demonstrated findings primarily consistent with major vascular neurocognitive disorder, relatively stable compared to prior testing in 2015.  Underlying neurodegenerative disorder could not be ruled out but clinically has been stable.    In February 2023, he became more confused.  Found to have UTI as well as acute infarct in left basal ganglia and corona radiata as well as severe distal left M1 MCA stenosis and moderate right ICA stenosis.  Discharged on ASA and Plavix  for 3 months followed by Plavix  monotherapy.  Following discharge, continued to be confused.  Mistakes wife with his ex-wife.  Incontinent.   Baseline hemineglect worse.    DaT scan on 08/05/2022 was equivocal - revealed marked decreased activity in the left striatum, however it correlates with large left basal ganglia infarct as seen on comparison CT.   Prior medication:  Aricept  (diarrhea, vivid dreams); galantamine  (nausea)  Lumbar spondylosis with radiculopathy Prior to COVID, he was going to the gym and seeing a personal trainer but it stopped once the pandemic hit.  Since the pandemic, he has been sedentary.  He walks the dog once or twice a week.  He started having bilateral leg pain later that year in 2020.  No associated back pain.  No shooting pain.  It is described as an aching in his calves.  No associated numbness.  He also reports weakness.  When he walks, he has trouble picking up his legs.  He seems to shuffle his feet.  When he gets up to stand, he feels like he can fall forward.  He had one fall while standing at the toilet and he bent forward to spit in toilet.  No change in bowel habit.  He was having urinary issues so he was taken off of Flomax .    Vascular US  ABI from 12/08/18 showed abnormal toe-brachial index in both lower extremities but otherwise unremarkable.  MRI of cervical spine without contrast from 03/10/2019 showed some cervical degenerative disc disease and spondylosis with mild foraminal narrowing but no spinal stenosis or compressive myelopathy.  He does have history of low back pain and lumbar spondylosis.  Due to ongoing unsteady gait with bilateral leg pain, MRI of lumbar spine was performed on 11/14/2019, which was personally reviewed and demonstrated degenerative changes stable compared to 2012 with L3-4 right subarticular stenosis.  He has had mixed response to epidural injections.    Headache: In February 2022, he reported new moderate non-throbbing headache back of head every night.  Ongoing for two months.  No neck pain.  It occurs in the evening while sitting talking or watching TV.  Treats with Tylenol   almost everyday.  Lasts a half hour.  No change in medication just prior to onset.  Similar headache when he had his stroke.  MRI of brain without contrast on 08/14/2020 showed no new acute/subacute intracranial abnormality.  Headaches resolved.     Prior testing: 01/30/20 Carotid doppler:  1-39% bilateral ICA stenosis. 10/16/13 LABS:  B12 720, TSH 1.668 10/19/13 MRI BRAIN WO:  stable remote large right MCA territory infarct.  Chronic microvascular ischemic changes. 05/21/13 LABS:  Hgb A1c 6 05/17/13 Carotid doppler: bilateral 1-39% stenosis of the right proximal ICA and no stenosis of the left ICA. 03/11/12 MRI Brain wo contrast:  Remote large right MCA infarct. Dementia with behavioral symptoms.  Etiology  unclear.  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   PAST MEDICAL HISTORY: 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   HTN (hypertension) 07/19/2015   Hyperlipidemia    Major neurocognitive disorder 01/09/2014   Mild, related to stroke history   Nocturia    Renal insufficiency 06/25/2013   S/P carotid endarterectomy    BILATERAL ICA--  PATENT PER DUPLEX  05-19-2012   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     amantadine  (SYMMETREL ) 100 MG capsule Take 1 capsule (100 mg total) by mouth daily. 90 capsule 1   amLODipine  (NORVASC ) 10 MG tablet Take 1 tablet (10 mg total) by mouth every morning. 90 tablet 1   bacitracin  ointment Apply 1 Application topically 2 (two) times daily. 120 g 0   carBAMazepine  (TEGRETOL ) 100 MG/5ML suspension Take 5 mLs (100 mg total) by mouth in the morning AND 5 mLs (100 mg total) daily at 2 PM AND 10 mLs (200 mg total) at bedtime. 600 mL 3   carbidopa -levodopa  (SINEMET  IR) 25-100 MG tablet Take 1.5 tablets by mouth 3 (three) times daily. 165 tablet 5   cephALEXin  (KEFLEX ) 500 MG capsule Take 1 capsule (500 mg total) by mouth 2 (two) times daily. 20 capsule 0   Cholecalciferol  (VITAMIN D3) 50 MCG (2000 UT) CHEW Chew 1 tablet by mouth daily.     clobetasol  cream (TEMOVATE ) 0.05 % Apply 1 Application to affected areas topically up to 2 (two) times daily as needed. Do not apply to face, groin, or under arms 60 g 3   clopidogrel  (PLAVIX ) 75 MG tablet Take 1 tablet (75 mg total) by mouth daily. 90 tablet 1   cyanocobalamin  (VITAMIN B12) 1000 MCG tablet Take 1 tablet (1,000 mcg total) by mouth daily. 90 tablet 1   EPINEPHrine  (EPIPEN  2-PAK) 0.3 mg/0.3 mL IJ SOAJ injection Inject 0.3 mg into the muscle as needed for anaphylaxis. 2 each 1   ferrous sulfate  325 (65 FE) MG EC tablet Take 325 mg by mouth 2 (two) times daily. *Crushed*     fluticasone  (CUTIVATE ) 0.05 % cream 1 application  at bedtime as needed (psoriasis).  3   hydrALAZINE  (APRESOLINE ) 25 MG tablet Take 1 tablet (25 mg total) by mouth 3 (three) times daily. 270 tablet 1   LORazepam  (ATIVAN ) 1 MG tablet Take 1/4 tablet by mouth in the morning, 1/2 tablet by mouth at lunch and 1 tablet by mouth at bedtime 60 tablet 2   LORazepam  (ATIVAN ) 1 MG tablet Take 1/4th (0.25 mg) tablet in the morning, 1/2 (half) tablet at 2pm and 1 (1mg ) tablet by mouth  at bedtime. 60 tablet 4   LORazepam  (ATIVAN ) 1 MG  tablet Take 1/4 tablet (0.25 mg total) by mouth in the morning AND 1/2 tablet (0.5 mg total) daily in the afternoon AND 1 tablet (1 mg total) at bedtime. 60 tablet 4   Multiple Vitamins-Minerals (ADULT ONE DAILY GUMMIES) CHEW Chew 1 tablet by mouth every morning.     NONFORMULARY OR COMPOUNDED ITEM Transport chair  dx parkinsons, hx falls 1 each 0   pantoprazole  (PROTONIX ) 40 MG tablet Take 1 tablet (40 mg total) by mouth daily. 90 tablet 1   polyethylene glycol (MIRALAX  / GLYCOLAX ) 17 g packet Take 17 g by mouth daily. (Patient taking differently: Take 17 g by mouth every other day.) 14 each 0   tamsulosin  (FLOMAX ) 0.4 MG CAPS capsule Take 1 capsule (0.4 mg total) by mouth daily after supper. (Patient taking differently: Take 0.4 mg by mouth at bedtime.) 90 capsule 1   tamsulosin  (FLOMAX ) 0.4 MG CAPS capsule Take 1 capsule (0.4 mg total) by mouth at bedtime. 90 capsule 3   traZODone  (DESYREL ) 150 MG tablet Take 0.5 tablets (75 mg total) by mouth at bedtime. *Crushed* 20 tablet 6   triamcinolone  cream (KENALOG ) 0.1 % Apply 1 application topically 2 (two) times daily. 30 g 0   Current Facility-Administered Medications on File Prior to Visit  Medication Dose Route Frequency Provider Last Rate Last Admin   gemcitabine  (GEMZAR ) chemo syringe for bladder instillation 2,000 mg  2,000 mg Bladder Instillation Once Dahlstedt, Stephen, MD        ALLERGIES: Allergies  Allergen Reactions   Bee Venom Anaphylaxis   Strawberry Extract Hives   Latex Itching   Zetia  [Ezetimibe ] Other (See Comments)    Intolerance    Adhesive [Tape] Other (See Comments)    blisters   Statins Other (See Comments)    myalgias    FAMILY HISTORY: Family History  Problem Relation Age of Onset   Heart disease Mother        CHF   Bipolar disorder Mother    Heart disease Father        CHF      Objective:  *** General: No acute distress.  Patient appears frail  Head:  Normocephalic/atraumatic Eyes:  Fundi examined  but not visualized Neck: supple, no paraspinal tenderness, full range of motion Heart:  Regular rate and rhythm Neurological Exam:  Alert and oriented.  Limited tracking with eye movements.  Trace left sided facial weakness.  Hypophonia.  Otherwise, CN II-XII intact.  Hypomimia.  Increased tone in wrists and elbows, more notable in the left upper extremity.  Muscle strength 3/5 left upper extremity, otherwise 4/5 throughout.  Resting tremor in hands.  Sensation to light touch intact.  Deep tendon reflexes 3+ throughout.  Uses upper extremities to push up from wheelchair to stand.  No freezing.  Brisk gait with short stride.  En bloc turn.  Reduced left arm swing.     Juliene Dunnings, DO  CC: Jamee Antonio Meth, DO

## 2024-03-06 ENCOUNTER — Ambulatory Visit: Admitting: Physical Therapy

## 2024-03-06 ENCOUNTER — Ambulatory Visit: Admitting: Neurology

## 2024-03-07 ENCOUNTER — Other Ambulatory Visit (HOSPITAL_COMMUNITY): Payer: Self-pay | Admitting: Psychiatry

## 2024-03-07 ENCOUNTER — Other Ambulatory Visit: Payer: Self-pay | Admitting: Family Medicine

## 2024-03-07 ENCOUNTER — Other Ambulatory Visit (HOSPITAL_BASED_OUTPATIENT_CLINIC_OR_DEPARTMENT_OTHER): Payer: Self-pay

## 2024-03-07 ENCOUNTER — Other Ambulatory Visit: Payer: Self-pay

## 2024-03-07 MED ORDER — AMANTADINE HCL 100 MG PO CAPS
100.0000 mg | ORAL_CAPSULE | Freq: Every day | ORAL | 1 refills | Status: DC
  Filled 2024-03-07: qty 90, 90d supply, fill #0
  Filled 2024-06-08: qty 90, 90d supply, fill #1

## 2024-03-07 NOTE — Therapy (Signed)
 OUTPATIENT PHYSICAL THERAPY NEURO TREATMENT    Patient Name: William [name] MRN: [id number] DOB:1941-01-23, 83 y.o., male Today's Date: 03/08/2024  PCP: [name]  DO REFERRING PROVIDER: same   END OF SESSION:  PT End of Session - 03/08/24 1544     Visit Number [id number]    Date for Recertification  04/10/24    Authorization Type HTA    PT Start Time 1545    PT Stop Time 1630    PT Time Calculation (min) 45 min    Equipment Utilized During Treatment Gait belt    Activity Tolerance Patient tolerated treatment well    Behavior During Therapy WFL for tasks assessed/performed                 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   HTN (hypertension) 07/19/2015   Hyperlipidemia    Major neurocognitive disorder 01/09/2014   Mild, related to stroke history   Nocturia    Renal insufficiency 06/25/2013   S/P carotid endarterectomy    BILATERAL ICA--  PATENT PER DUPLEX  05-19-2012   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/26/2017   Right calf   Urinary frequency    Vitamin D  deficiency    Past Surgical History:  Procedure Laterality Date   APPENDECTOMY  AS CHILD   CARDIOVASCULAR STRESS TEST  03-27-2012  DR CRENSHAW   LOW RISK LEXISCAN  STUDY-- PROBABLE NORMAL PERFUSION AND SOFT TISSUE ATTENUATION/  NO ISCHEMIA/ EF 51%   CAROTID ENDARTERECTOMY Bilateral LEFT  11-12-2008  DR GREG HAYES   RIGHT ICA  2006  ([facility])   CHOLECYSTECTOMY N/A 02/23/2022   Procedure: LAPAROSCOPIC CHOLECYSTECTOMY;  Surgeon: [name], MD;  Location: [facility] OR;  Service:  General;  Laterality: N/A;   CYSTOSCOPY W/ RETROGRADES Bilateral 06/22/2021   Procedure: CYSTOSCOPY WITH RETROGRADE PYELOGRAM;  Surgeon: [name], MD;  Location: [facility];  Service: Urology;  Laterality: Bilateral;   CYSTOSCOPY WITH LITHOLAPAXY N/A 02/26/2013   Procedure: CYSTOSCOPY WITH LITHOLAPAXY;  Surgeon: Senior Matilda, MD;  Location: [facility];  Service: Urology;  Laterality: N/A;   ENDOSCOPIC RETROGRADE CHOLANGIOPANCREATOGRAPHY (ERCP) WITH PROPOFOL  N/A 02/22/2022   Procedure: ENDOSCOPIC RETROGRADE CHOLANGIOPANCREATOGRAPHY (ERCP) WITH PROPOFOL ;  Surgeon: [name], MD;  Location: [facility] ENDOSCOPY;  Service: Gastroenterology;  Laterality: N/A;   EYE SURGERY  Jan. 2016   cataract surgery both eyes   INGUINAL HERNIA REPAIR Right 11-08-2006   IR KYPHO EA ADDL LEVEL THORACIC OR LUMBAR  02/12/2021   IR RADIOLOGIST EVAL & MGMT  02/18/2021   MASS EXCISION N/A 03/03/2016   Procedure: EXCISION OF BACK  MASS;  Surgeon: [name], MD;  Location: [facility];  Service: General;  Laterality: N/A;   MOHS SURGERY Left 1/ 2016   [name]-- Basal cell   PROSTATE SURGERY     REMOVAL OF STONES  02/22/2022   Procedure: REMOVAL OF STONES;  Surgeon: [name], MD;  Location: [facility] ENDOSCOPY;  Service: Gastroenterology;;   [name]  02/22/2022   Procedure: SPHINCTEROTOMY;  Surgeon: [name], MD;  Location: [facility] ENDOSCOPY;  Service: Gastroenterology;;   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 N/A 06/22/2021   Procedure: TRANSURETHRAL RESECTION OF BLADDER TUMOR;  Surgeon: [name], MD;  Location: [facility];  Service: Urology;  Laterality: N/A;   TRANSURETHRAL RESECTION OF PROSTATE N/A 02/26/2013   Procedure: TRANSURETHRAL RESECTION OF THE PROSTATE WITH GYRUS INSTRUMENTS;  Surgeon: Senior Matilda, MD;  Location: [facility];  Service: Urology;  Laterality: N/A;   TRANSURETHRAL RESECTION OF  PROSTATE N/A 06/22/2021   Procedure: TRANSURETHRAL RESECTION OF THE PROSTATE (TURP);  Surgeon: [name], MD;  Location: [facility];  Service: Urology;  Laterality: N/A;   Patient Active Problem List   Diagnosis Date Noted   Bronchitis 06/16/2023   Impacted cerumen of left ear 06/16/2023   Acute encephalopathy 04/18/2023   Parkinson's disease (HCC) 04/18/2023   History of CVA with residual deficit 04/18/2023   Chronic kidney disease, stage 3b (HCC) 04/18/2023   Dementia with behavioral disturbance (HCC) 04/18/2023   Anemia of chronic disease 04/18/2023   Laceration of right upper extremity 02/24/2023   Dermatitis 02/24/2023   Lung nodules 02/24/2023   Dysuria 08/03/2022   Nonintractable headache 07/01/2022   Bilateral impacted cerumen 06/24/2022   Rash 06/15/2022   Postoperative ileus (HCC) 02/27/2022   Ileus, postoperative (HCC) 02/26/2022   Choledocholithiasis 02/19/2022   DNR (do not resuscitate) 02/19/2022   Left elbow pain 01/26/2022   Mid back pain on left side 01/26/2022   Rib pain 01/26/2022   Acute pain of left shoulder 11/12/2021   Leukocytes in urine 11/12/2021   Urinary frequency 11/12/2021   Thrush 10/08/2021   Hemiplegia, 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   Insomnia    Prediabetes    Acute 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 (HCC)    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   Transaminitis 07/27/2021   UTI (urinary tract infection) 07/27/2021   CVA (cerebral vascular accident) (HCC) 07/27/2021   Fall 07/27/2021   Hyperglycemia 07/27/2021   Cholelithiasis 07/27/2021   Hypoxia 07/27/2021   Nausea and vomiting 07/27/2021   Acute metabolic encephalopathy 07/27/2021   Normocytic anemia 07/27/2021   Chronic back pain 07/27/2021   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   Closed fracture of first lumbar vertebra with routine healing 02/03/2021   Closed  fracture of multiple ribs 11/18/2020   Anxiety 01/29/2020   Leg pain, bilateral 01/29/2020   Ingrown toenail 07/13/2019   Lumbar spondylosis 05/02/2018   Pain in left knee 03/09/2018   Osteoarthritis of left hip 01/16/2018   Trochanteric bursitis of left hip 01/16/2018   Preventative health care 09/26/2017   HTN (hypertension) 07/19/2015   Hyperlipidemia 07/19/2015   Great toe pain 02/11/2014   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   Obesity (BMI 30-39.9) 06/25/2013   Renal insufficiency 06/25/2013   Weakness of left arm 06/25/2013   Sebaceous cyst 03/03/2011   Sprain of lumbar region 07/31/2010   Rib pain, left 08/29/2009   Carotid artery stenosis, asymptomatic, bilateral 05/02/2009   Eczema, atopic 05/31/2008   Vitamin D  deficiency 03/01/2008   BPH (benign prostatic hyperplasia) 08/06/2007   Fasting hyperglycemia 12/21/2006   History of right MCA infarct 2006    ONSET DATE: 06/29/22 REFERRING DIAG:  Diagnosis  I63.9 (ICD-10-CM) - Cerebrovascular accident (CVA), unspecified mechanism (HCC)  W19.XXXD (ICD-10-CM) - Fall, subsequent encounter    THERAPY DIAG:  Muscle weakness (generalized)  Other abnormalities of gait and mobility  Hemiplegia and hemiparesis following cerebral infarction affecting left non-dominant side (HCC)  Difficulty in walking, not elsewhere classified  History of falling  Repeated falls  Rationale for Evaluation and Treatment: Rehabilitation  SUBJECTIVE:                                                                                                                                                                                         SUBJECTIVE STATEMENT:  Doing alright, no falls per wife. Reports his L side seems to be a little weaker.   PERTINENT HISTORY:  William [name] is a 83 year old man with dementia, CKD, HTN, CAD, HLD and history of CVA  (2006, 2023), Parkinson's  PAIN:  Are you having pain? Faces/behavior scale- mild  pain BLEs  and back   PRECAUTIONS: Fall  WEIGHT BEARING RESTRICTIONS: No  FALLS: Has patient fallen in last 6 months? No  LIVING ENVIRONMENT: Lives with: lives with their family and lives with their spouse Lives in: House/apartment Stairs: 1 brick high step Has following equipment at home: Environmental consultant - 2 wheeled, Wheelchair (manual), Shower bench, bed side commode, Grab bars, and hospital bed, sliding pad for car  PLOF: Independent with basic ADLs and Independent with household mobility without device  PATIENT GOALS: Walk around the block with his wife. Be able to use the dining room chair rather than have to use the W/C. Increased I with bed mobility and simple tasks at home like make some coffee or brush his teeth, Step over tub to shower, has bench, but he can't really use it. Up and down the one step to get to back deck. 11/23/22: Patient's wife William [name] updated his goals today: Walk around store (gets distracted), swing a golf club again (thinking of driving range), get up on his own, walk without leaning forward, more recognition of the left side (from CVA), not need gait belt, OT-- be able to get dressed more on his own, navigate the newspaper.  OBJECTIVE:   TREATMENT: 03/08/24 NuStep L5x  UBE L3 x26mins each way- hard time keeping hand on bar today AB isometric 2x10 Ball squeezes 2x10 Standing ball dribbling Kicking different color cones Dribbling ball back and forth with wife   02/28/24 NuStep L5x18mins UBE L2 x62mins each way  Seated PWR moves- holding big red ball touching floor and reaching overhead, rotations holding big red ball Punches with 2# weights  LAQ 5# 2x12 Modified sit ups 2x10  02/21/24 Nustep level 5 x 9 minutes UBE level 3 x 5 minutes Worked with wife on the lift from floor chair, is  this feasible and possible for William [name] to use.  I worked on trying to simulate this, with him having to sit up from supine with him going to long sitting.  Difficult for him due to  tight HS, then can he push himself up onto a small seating surface, again very difficulty for him as he was falling backward Passive HS stretches Seated PWR moves and instructed his wife with these Gait out back door through the grass to the car  02/14/24 Nustep level 5 x 6 minutes Bike level 4 x 5 minutes UBE level 4 x 5 minutes STM to the left shoulder area mainly upper trap Some genlte PROM of the left shoulder Gait with HHA out the back door around one of the islands on the side of the back building back up the hill to the picnic table and rest and then through the grass on the side of the building to the front door, this was very fatiguing for him, a lot of cues verbal and physical to get him to stand up Gait out to the car  PATIENT EDUCATION: Education details: none new 02/16/23 Person educated: Patient and Spouse Education method: Explanation, Demonstration, Tactile cues, and Verbal cues Education comprehension: verbalized understanding, returned demonstration, verbal cues required, tactile cues required, and needs further education   ------------------------------------------------------------------------------------------------------- (Measures in this section from initial evaluation unless otherwise noted) DIAGNOSTIC FINDINGS:  MRI 2021 with degenerative changes in lumbar spine, L3-4 R subarticular stenosis COGNITION: Overall cognitive status: History of cognitive impairments - at baseline SENSATION: Not tested COORDINATION: Moderately impaired BLE MUSCLE LENGTH: Hamstrings: severely restricted B Thomas test: Severely restricted B. POSTURE: rounded shoulders, forward head, increased thoracic kyphosis, posterior pelvic tilt, and flexed trunk  LOWER EXTREMITY ROM:   BLE extremely stiff throughout, limited hip ROM in all planes, B knees limited in extension. LOWER EXTREMITY MMT:  3+/5 throughout. Unable to determine accuracy of MMT due to cognition. Functionally demosntrates poor  coordinated activation and poor muscular endurance. BED MOBILITY: Min A for Supine<> Sit. Patient was using a ladder in his hospital bed and could move MI. TRANSFERS: Min A, mod VC, tends to lean back.  11/02/22 independent with use of hands on arm chair with a couple of tries CURB: Min A-Has one step out to his deck. STAIRS: N/A  GAIT: Gait pattern: step to pattern, decreased arm swing- Right, decreased arm swing- Left, decreased step length- Right, decreased step length- Left, shuffling, festinating, trunk flexed, narrow BOS, poor foot clearance- Right, and poor foot clearance- Left Distance walked: 53' Assistive device utilized: Environmental consultant - 2 wheeled Level of assistance: Occasional min A Comments: mod VC to stay close to walker, take longer steps. FUNCTIONAL TESTS:  5 times sit to stand: 38.23  09/29/22  could not performs 5XSTS, 11/02/22 = 47 seconds with cues to use arms, 07/21/23 really struggled today 55 seconds Timed up and go (TUG): 47.91 with RW, 07/21/22 TUG 29  seconds without device, 09/29/22 25 seconds, 11/02/22 TUG 20 seconds, 07/21/23 = 26 seconds no device SBA                                                                     PATIENT EDUCATION: Education details: Progress towards goals, POC Person educated: Patient  and Spouse Education method: Explanation, Demonstration, and Verbal cues  Education comprehension: verbalized understanding, returned demonstration, verbal cues required, and needs further education  HOME EXERCISE PROGRAM: PWR! moves  GOALS:  SHORT TERM GOALS: Target date: 01/25/24   Pt will perform HEP to maintain lower body strength, flexibility, posture with wife's assistance. Baseline:  Goal status: met 04/12/23  2. Pt will perform sit to stand, 8 of 10 reps with BUE support, with supervision, to maintain safety with transfers.  Baseline: min guard  Goal status: 02/07/23-CGA from elevated surface and max VC to lean forward, ongoing  continues to need a lot of cues  for hand placement and to lean forward, tends to lean backward, needs cues and some assistance 02/28/24  LONG TERM GOALS: Target date: 04/10/24   Patient will perform HEP progression to maintain lower body strength, flexibility, posture with wife's assistance. Baseline:  Goal status: doing some walking with his wife when she feels safe, has done some pedaling at home as well 12/13/23, working on this , his wife was out of  town and with the heat has not been able to walk much this summer, ongoing 02/28/24  2.  Pt will ambulate 150-175 ft in 2 MWT with min guard assist, to preserve functional mobility within the home.  Baseline: Goal status:  met 08/25/23  3. Wife will verbalize tips for fall prevention education to minimize fall frequency Baseline:  Goal status:progressing and doing well 4.  Patient will be able to toilet without freezing for over a 4 week period met 11/15/23 5.  Patient will be able to get out of bed from supine with wife assist minimal Assist,met 10/06/23   ASSESSMENT:  CLINICAL IMPRESSION:  Patient returns with no new changes. Needed a break in middle of session for restroom. He was a bit more distracted and impulsive towards end of session. Also had more shuffling today and had a few instances where he sped up and needed assistance to prevent falling. Does well with kicking cones alternating feet.    OBJECTIVE IMPAIRMENTS: Abnormal gait, decreased activity tolerance, decreased balance, decreased cognition, decreased coordination, decreased endurance, decreased mobility, difficulty walking, decreased ROM, decreased strength, decreased safety awareness, impaired flexibility, impaired UE functional use, improper body mechanics, and postural dysfunction.   PLAN:  PT FREQUENCY: 1x/week  PT DURATION: 12 weeks  PLANNED INTERVENTIONS: Therapeutic exercises, Therapeutic activity, Neuromuscular re-education, Balance training, Gait training, Patient/Family education, Self Care,  Joint mobilization, Stair training, Moist heat, and Manual therapy  PLAN FOR NEXT SESSION:   Will continue to work with patient to help him and his wife stay living at home and with less difficulty   Patient Details  Name: William [name] MRN: [id number] Date of Birth: 1941-01-18 Referring Provider:  Antonio Meth, Jamee SAUNDERS, *

## 2024-03-08 ENCOUNTER — Ambulatory Visit

## 2024-03-08 DIAGNOSIS — R296 Repeated falls: Secondary | ICD-10-CM

## 2024-03-08 DIAGNOSIS — M6281 Muscle weakness (generalized): Secondary | ICD-10-CM

## 2024-03-08 DIAGNOSIS — R262 Difficulty in walking, not elsewhere classified: Secondary | ICD-10-CM

## 2024-03-08 DIAGNOSIS — Z9181 History of falling: Secondary | ICD-10-CM

## 2024-03-08 DIAGNOSIS — R2689 Other abnormalities of gait and mobility: Secondary | ICD-10-CM

## 2024-03-08 DIAGNOSIS — I69354 Hemiplegia and hemiparesis following cerebral infarction affecting left non-dominant side: Secondary | ICD-10-CM

## 2024-03-10 ENCOUNTER — Ambulatory Visit
Admission: EM | Admit: 2024-03-10 | Discharge: 2024-03-10 | Disposition: A | Discharge status: Home / Self Care | Attending: Family Medicine | Admitting: Family Medicine

## 2024-03-10 ENCOUNTER — Ambulatory Visit (INDEPENDENT_AMBULATORY_CARE_PROVIDER_SITE_OTHER)

## 2024-03-10 DIAGNOSIS — K5900 Constipation, unspecified: Secondary | ICD-10-CM | POA: Diagnosis not present

## 2024-03-10 DIAGNOSIS — K5641 Fecal impaction: Secondary | ICD-10-CM | POA: Diagnosis not present

## 2024-03-10 HISTORY — DX: Parkinson's disease without dyskinesia, without mention of fluctuations: G20.A1

## 2024-03-10 HISTORY — DX: Chronic kidney disease, stage 3 unspecified: N18.30

## 2024-03-10 MED ORDER — MILK OF MAGNESIA 7.75 % PO SUSP: 60.0000 mL | Freq: Every day | ORAL | 0 refills | Status: DC | PRN

## 2024-03-10 MED ORDER — GLYCERIN (ADULT) 2 G RE SUPP: 1.0000 | RECTAL | 0 refills | Status: DC | PRN

## 2024-03-10 NOTE — ED Provider Notes (Signed)
 [facility]  Note:  This document was prepared using Conservation officer, historic buildings and may include unintentional dictation errors.  MRN: [id number] DOB: [birth year]  Subjective:   William [name] is a 83 y.o. male presenting for 1 week history of acute on chronic constipation.  The past day has had more lower abdominal and pelvic/rectal pain.  Patient has a history of Parkinson's and has difficulty with his bowels in general.  Patient's wife has tried to give him sips of MiraLAX  throughout the day but never gets a full dose.  He has previously had to be manually disimpacted.  Has CKD, GFR just above 30 mL/min.  No current facility-administered medications for this encounter.  Current Outpatient Medications:    acetaminophen  (TYLENOL ) 325 MG tablet, Take 2 tablets (650 mg total) by mouth every 6 (six) hours as needed for mild pain., Disp: , Rfl:    amantadine  (SYMMETREL ) 100 MG capsule, Take 1 capsule (100 mg total) by mouth daily., Disp: 90 capsule, Rfl: 1   amLODipine  (NORVASC ) 10 MG tablet, Take 1 tablet (10 mg total) by mouth every morning., Disp: 90 tablet, Rfl: 1   bacitracin  ointment, Apply 1 Application topically 2 (two) times daily., Disp: 120 g, Rfl: 0   carBAMazepine  (TEGRETOL ) 100 MG/5ML suspension, Take 5 mLs (100 mg total) by mouth in the morning AND 5 mLs (100 mg total) daily at 2 PM AND 10 mLs (200 mg total) at bedtime., Disp: 600 mL, Rfl: 3   carbidopa -levodopa  (SINEMET  IR) 25-100 MG tablet, Take 1.5 tablets by mouth 3 (three) times daily., Disp: 165 tablet, Rfl: 5   cephALEXin  (KEFLEX ) 500 MG capsule, Take 1 capsule (500 mg total) by mouth 2 (two) times daily., Disp: 20 capsule, Rfl: 0   Cholecalciferol  (VITAMIN D3) 50 MCG (2000 UT) CHEW, Chew 1 tablet by mouth daily., Disp: , Rfl:    clobetasol  cream (TEMOVATE ) 0.05 %, Apply 1 Application to affected areas topically up to 2 (two) times daily as needed. Do not apply to face, groin, or under arms,  Disp: 60 g, Rfl: 3   clopidogrel  (PLAVIX ) 75 MG tablet, Take 1 tablet (75 mg total) by mouth daily., Disp: 90 tablet, Rfl: 1   cyanocobalamin  (VITAMIN B12) 1000 MCG tablet, Take 1 tablet (1,000 mcg total) by mouth daily., Disp: 90 tablet, Rfl: 1   EPINEPHrine  (EPIPEN  2-PAK) 0.3 mg/0.3 mL IJ SOAJ injection, Inject 0.3 mg into the muscle as needed for anaphylaxis., Disp: 2 each, Rfl: 1   ferrous sulfate  325 (65 FE) MG EC tablet, Take 325 mg by mouth 2 (two) times daily. *Crushed*, Disp: , Rfl:    fluticasone  (CUTIVATE ) 0.05 % cream, 1 application  at bedtime as needed (psoriasis)., Disp: , Rfl: 3   hydrALAZINE  (APRESOLINE ) 25 MG tablet, Take 1 tablet (25 mg total) by mouth 3 (three) times daily., Disp: 270 tablet, Rfl: 1   LORazepam  (ATIVAN ) 1 MG tablet, Take 1/4 tablet by mouth in the morning, 1/2 tablet by mouth at lunch and 1 tablet by mouth at bedtime, Disp: 60 tablet, Rfl: 2   LORazepam  (ATIVAN ) 1 MG tablet, Take 1/4th (0.25 mg) tablet in the morning, 1/2 (half) tablet at 2pm and 1 (1mg ) tablet by mouth at bedtime., Disp: 60 tablet, Rfl: 4   LORazepam  (ATIVAN ) 1 MG tablet, Take 1/4 tablet (0.25 mg total) by mouth in the morning AND 1/2 tablet (0.5 mg total) daily in the afternoon AND 1 tablet (1 mg total) at bedtime., Disp: 60  tablet, Rfl: 4   Multiple Vitamins-Minerals (ADULT ONE DAILY GUMMIES) CHEW, Chew 1 tablet by mouth every morning., Disp: , Rfl:    NONFORMULARY OR COMPOUNDED ITEM, Transport chair  dx parkinsons, hx falls, Disp: 1 each, Rfl: 0   pantoprazole  (PROTONIX ) 40 MG tablet, Take 1 tablet (40 mg total) by mouth daily., Disp: 90 tablet, Rfl: 1   polyethylene glycol (MIRALAX  / GLYCOLAX ) 17 g packet, Take 17 g by mouth daily. (Patient taking differently: Take 17 g by mouth every other day.), Disp: 14 each, Rfl: 0   tamsulosin  (FLOMAX ) 0.4 MG CAPS capsule, Take 1 capsule (0.4 mg total) by mouth daily after supper. (Patient taking differently: Take 0.4 mg by mouth at bedtime.), Disp: 90  capsule, Rfl: 1   tamsulosin  (FLOMAX ) 0.4 MG CAPS capsule, Take 1 capsule (0.4 mg total) by mouth at bedtime., Disp: 90 capsule, Rfl: 3   traZODone  (DESYREL ) 150 MG tablet, Take 0.5 tablets (75 mg total) by mouth at bedtime. *Crushed*, Disp: 20 tablet, Rfl: 6   triamcinolone  cream (KENALOG ) 0.1 %, Apply 1 application topically 2 (two) times daily., Disp: 30 g, Rfl: 0  Facility-Administered Medications Ordered in Other Encounters:    gemcitabine  (GEMZAR ) chemo syringe for bladder instillation 2,000 mg, 2,000 mg, Bladder Instillation, Once, [name], MD   Allergies  Allergen Reactions   Bee Venom Anaphylaxis   Strawberry Extract Hives   Latex Itching   Zetia  [Ezetimibe ] Other (See Comments)    Intolerance    Adhesive [Tape] Other (See Comments)    blisters   Statins Other (See Comments)    myalgias    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   HTN (hypertension) 07/19/2015   Hyperlipidemia    Kidney disease, chronic, stage III (GFR 30-59 ml/min) (HCC)    Major neurocognitive disorder 01/09/2014   Mild, related to stroke history   Nocturia    Parkinson disease (HCC)    Renal insufficiency 06/25/2013   S/P carotid endarterectomy    BILATERAL ICA--  PATENT PER DUPLEX  05-19-2012   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   Right calf   Urinary frequency    Vitamin D  deficiency      Past Surgical History:  Procedure Laterality Date   APPENDECTOMY  AS CHILD   CARDIOVASCULAR STRESS TEST  03-27-2012  DR CRENSHAW   LOW RISK LEXISCAN  STUDY-- PROBABLE  NORMAL PERFUSION AND SOFT TISSUE ATTENUATION/  NO ISCHEMIA/ EF 51%   CAROTID ENDARTERECTOMY Bilateral LEFT  11-12-2008  DR GREG HAYES   RIGHT ICA  2006  ([facility])   CHOLECYSTECTOMY N/A 02/23/2022   Procedure: LAPAROSCOPIC CHOLECYSTECTOMY;  Surgeon: Lyndel Deward PARAS, MD;  Location: [facility] OR;  Service: General;  Laterality: N/A;   CYSTOSCOPY W/ RETROGRADES Bilateral 06/22/2021   Procedure: CYSTOSCOPY WITH RETROGRADE PYELOGRAM;  Surgeon: [name], MD;  Location: [facility];  Service: Urology;  Laterality: Bilateral;   CYSTOSCOPY WITH LITHOLAPAXY N/A 02/26/2013   Procedure: CYSTOSCOPY WITH LITHOLAPAXY;  Surgeon: Senior Matilda, MD;  Location:  [facility];  Service: Urology;  Laterality: N/A;   ENDOSCOPIC RETROGRADE CHOLANGIOPANCREATOGRAPHY (ERCP) WITH PROPOFOL  N/A 02/22/2022   Procedure: ENDOSCOPIC RETROGRADE CHOLANGIOPANCREATOGRAPHY (ERCP) WITH PROPOFOL ;  Surgeon: [name], MD;  Location: [facility] ENDOSCOPY;  Service: Gastroenterology;  Laterality: N/A;   EYE SURGERY  Jan. 2016   cataract surgery both eyes   INGUINAL HERNIA REPAIR Right 11-08-2006   IR KYPHO EA ADDL LEVEL THORACIC OR LUMBAR  02/12/2021   IR RADIOLOGIST EVAL & MGMT  02/18/2021   MASS EXCISION N/A 03/03/2016   Procedure: EXCISION OF BACK  MASS;  Surgeon: [name], MD;  Location: [facility];  Service: General;  Laterality: N/A;   MOHS SURGERY Left 1/ 2016   [name]-- Basal cell   PROSTATE SURGERY     REMOVAL OF STONES  02/22/2022   Procedure: REMOVAL OF STONES;  Surgeon: [name], MD;  Location: [facility] ENDOSCOPY;  Service: Gastroenterology;;   [name]  02/22/2022   Procedure: [name];  Surgeon: [name], MD;  Location: [facility] ENDOSCOPY;  Service: Gastroenterology;;   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;   Service: Urology;  Laterality: N/A;   TRANSURETHRAL RESECTION OF PROSTATE N/A 02/26/2013   Procedure: TRANSURETHRAL RESECTION OF THE PROSTATE WITH GYRUS INSTRUMENTS;  Surgeon: Senior Matilda, MD;  Location: [facility];  Service: Urology;  Laterality: N/A;   TRANSURETHRAL RESECTION OF PROSTATE N/A 06/22/2021   Procedure: TRANSURETHRAL RESECTION OF THE PROSTATE (TURP);  Surgeon: [name], MD;  Location: [facility];  Service: Urology;  Laterality: N/A;    Family History  Problem Relation Age of Onset   Heart disease Mother        CHF   Bipolar disorder Mother    Heart disease Father        CHF    Social History   Tobacco Use   Smoking status: Former    Current packs/day: 0.00    Average packs/day: 2.0 packs/day for 40.0 years (80.0 ttl pk-yrs)    Types: Cigarettes    Start date: 02/15/1965    Quit date: 02/15/2005    Years since quitting: 19.0   Smokeless tobacco: Never  Vaping Use   Vaping status: Never Used  Substance Use Topics   Alcohol use: Not Currently   Drug use: No    ROS   Objective:   Vitals: BP (!) 147/74 (BP Location: Left Arm)   Pulse 73   Temp 97.6 F (36.4 C) (Oral)   Resp 18   SpO2 96%   Physical Exam Constitutional:      General: He is not in acute distress.    Appearance: Normal appearance. He is well-developed and normal weight. He is not ill-appearing, toxic-appearing or diaphoretic.  HENT:     Head: Normocephalic and atraumatic.     Right Ear: External ear normal.     Left Ear: External ear normal.     Nose: Nose normal.     Mouth/Throat:     Pharynx: Oropharynx is clear.  Eyes:     General: No scleral icterus.       Right eye: No discharge.        Left eye: No discharge.     Extraocular Movements: Extraocular movements intact.  Cardiovascular:     Rate and Rhythm: Normal rate.  Pulmonary:     Effort: Pulmonary effort is normal.  Abdominal:     General: Bowel sounds are normal. There is no  distension.     Palpations: Abdomen is soft. There is no mass.     Tenderness: There is abdominal tenderness (generalized). There is no right CVA tenderness, left CVA tenderness, guarding or rebound.  Musculoskeletal:     Cervical back: Normal range of motion.  Neurological:     Mental Status: He is alert and oriented to person, place, and time.  Psychiatric:        Mood and Affect: Mood normal.        Behavior: Behavior normal.        Thought Content: Thought content normal.        Judgment: Judgment normal.    DG Abd 1 View Result Date: 03/10/2024 EXAM: 1 VIEW XRAY OF THE ABDOMEN 03/10/2024 12:29:44 PM COMPARISON: CT 01/17/2023. CLINICAL HISTORY: r/o obstruction. r/o obstruction. FINDINGS: BOWEL: Large stool burden is identified within the colon with a moderate amount of well-formed desiccated stool in the rectum. No dilated small bowel loops. SOFT TISSUES: Cholecystectomy clips noted. No abnormal urinary tract calculi. BONES: Treated compression deformity is noted within the lumbar spine. IMPRESSION: 1. No bowel obstruction. 2. Large colonic stool burden with moderate desiccated rectal stool. Electronically signed by: Waddell Calk MD 03/10/2024 12:38 PM EDT RP Workstation: [id number]     Assessment and Plan :   PDMP not reviewed this encounter.  1. Constipation, unspecified constipation type    Given patient's CKD, recommended glycerin  suppository, milk of magnesia.  I did recommend that if she was inclined to do a full dose of MiraLAX  (specifically 17 g/1 full scoop) at once, she could do this instead to see if he has a complete bowel movement. Counseled patient on potential for adverse effects with medications prescribed/recommended today, ER and return-to-clinic precautions discussed, patient verbalized understanding.    Christopher Savannah, [place] 03/10/24 [id number]

## 2024-03-10 NOTE — ED Triage Notes (Signed)
 Per wife, pt has constipation x 7 days. Miralax  gives no relief. Per wife, pt has low abdominal pain x 1 day.

## 2024-03-11 ENCOUNTER — Encounter (HOSPITAL_COMMUNITY): Payer: Self-pay

## 2024-03-11 ENCOUNTER — Emergency Department (HOSPITAL_COMMUNITY)
Admission: EM | Admit: 2024-03-11 | Discharge: 2024-03-11 | Discharge status: Against Medical Advice | Attending: Emergency Medicine | Admitting: Emergency Medicine

## 2024-03-11 DIAGNOSIS — Z5321 Procedure and treatment not carried out due to patient leaving prior to being seen by health care provider: Secondary | ICD-10-CM | POA: Diagnosis not present

## 2024-03-11 DIAGNOSIS — K5900 Constipation, unspecified: Secondary | ICD-10-CM | POA: Diagnosis not present

## 2024-03-11 LAB — CBC
HCT: 39 % (ref 39.0–52.0)
Hemoglobin: 12.8 g/dL — ABNORMAL LOW (ref 13.0–17.0)
MCH: 32.9 pg (ref 26.0–34.0)
MCHC: 32.8 g/dL (ref 30.0–36.0)
MCV: 100.3 fL — ABNORMAL HIGH (ref 80.0–100.0)
Platelets: 197 K/uL (ref 150–400)
RBC: 3.89 MIL/uL — ABNORMAL LOW (ref 4.22–5.81)
RDW: 13.6 % (ref 11.5–15.5)
WBC: 6.9 K/uL (ref 4.0–10.5)
nRBC: 0 % (ref 0.0–0.2)

## 2024-03-11 LAB — COMPREHENSIVE METABOLIC PANEL WITH GFR
ALT: <5 U/L (ref 0–44)
AST: 13 U/L — ABNORMAL LOW (ref 15–41)
Albumin: 4.1 g/dL (ref 3.5–5.0)
Alkaline Phosphatase: 185 U/L — ABNORMAL HIGH (ref 38–126)
Anion gap: 11 (ref 5–15)
BUN: 26 mg/dL — ABNORMAL HIGH (ref 8–23)
CO2: 27 mmol/L (ref 22–32)
Calcium: 9.2 mg/dL (ref 8.9–10.3)
Chloride: 104 mmol/L (ref 98–111)
Creatinine, Ser: 1.72 mg/dL — ABNORMAL HIGH (ref 0.61–1.24)
GFR, Estimated: 39 mL/min — ABNORMAL LOW (ref >60)
Glucose, Bld: 108 mg/dL — ABNORMAL HIGH (ref 70–99)
Potassium: 5 mmol/L (ref 3.5–5.1)
Sodium: 142 mmol/L (ref 135–145)
Total Bilirubin: 0.3 mg/dL (ref 0.0–1.2)
Total Protein: 7.2 g/dL (ref 6.5–8.1)

## 2024-03-11 LAB — LIPASE, BLOOD: Lipase: 22 U/L (ref 11–51)

## 2024-03-11 NOTE — ED Notes (Signed)
 Patient decided to leave without being seen. RN notified

## 2024-03-11 NOTE — ED Triage Notes (Signed)
 Pt has not had a BM for the past week, went to UC yesterday and given suppository and had xray, told no obstruction. Wife states she is unable to get another suppository in now

## 2024-03-13 ENCOUNTER — Other Ambulatory Visit (HOSPITAL_COMMUNITY): Payer: Self-pay | Admitting: *Deleted

## 2024-03-13 MED ORDER — CARBAMAZEPINE 100 MG/5ML PO SUSP: ORAL | 0 refills | Status: DC

## 2024-03-14 ENCOUNTER — Other Ambulatory Visit (HOSPITAL_BASED_OUTPATIENT_CLINIC_OR_DEPARTMENT_OTHER): Payer: Self-pay

## 2024-03-14 ENCOUNTER — Other Ambulatory Visit (HOSPITAL_COMMUNITY): Payer: Self-pay | Admitting: Psychiatry

## 2024-03-15 ENCOUNTER — Other Ambulatory Visit (HOSPITAL_COMMUNITY): Payer: Self-pay | Admitting: *Deleted

## 2024-03-15 ENCOUNTER — Ambulatory Visit: Attending: Family Medicine | Admitting: Physical Therapy

## 2024-03-15 ENCOUNTER — Encounter: Payer: Self-pay | Admitting: Physical Therapy

## 2024-03-15 ENCOUNTER — Other Ambulatory Visit (HOSPITAL_BASED_OUTPATIENT_CLINIC_OR_DEPARTMENT_OTHER): Payer: Self-pay

## 2024-03-15 DIAGNOSIS — R41840 Attention and concentration deficit: Secondary | ICD-10-CM | POA: Diagnosis not present

## 2024-03-15 DIAGNOSIS — R278 Other lack of coordination: Secondary | ICD-10-CM | POA: Diagnosis not present

## 2024-03-15 DIAGNOSIS — R2689 Other abnormalities of gait and mobility: Secondary | ICD-10-CM | POA: Diagnosis not present

## 2024-03-15 DIAGNOSIS — M25512 Pain in left shoulder: Secondary | ICD-10-CM | POA: Insufficient documentation

## 2024-03-15 DIAGNOSIS — I69354 Hemiplegia and hemiparesis following cerebral infarction affecting left non-dominant side: Secondary | ICD-10-CM | POA: Insufficient documentation

## 2024-03-15 DIAGNOSIS — R296 Repeated falls: Secondary | ICD-10-CM | POA: Insufficient documentation

## 2024-03-15 DIAGNOSIS — Z9181 History of falling: Secondary | ICD-10-CM | POA: Diagnosis not present

## 2024-03-15 DIAGNOSIS — I6523 Occlusion and stenosis of bilateral carotid arteries: Secondary | ICD-10-CM | POA: Diagnosis not present

## 2024-03-15 DIAGNOSIS — R41841 Cognitive communication deficit: Secondary | ICD-10-CM | POA: Insufficient documentation

## 2024-03-15 DIAGNOSIS — M6281 Muscle weakness (generalized): Secondary | ICD-10-CM | POA: Diagnosis not present

## 2024-03-15 DIAGNOSIS — R262 Difficulty in walking, not elsewhere classified: Secondary | ICD-10-CM | POA: Diagnosis not present

## 2024-03-15 MED ORDER — CARBAMAZEPINE 100 MG/5ML PO SUSP
ORAL | 0 refills | Status: DC
  Filled 2024-03-15 (×2): qty 600, 30d supply, fill #0

## 2024-03-15 NOTE — Therapy (Signed)
 OUTPATIENT PHYSICAL THERAPY NEURO TREATMENT    Patient Name: William [name] MRN: [id number] DOB:1941/01/29, 83 y.o., male Today's Date: 03/15/2024  PCP: William Cyndee [name]  DO REFERRING PROVIDER: same   END OF SESSION:  PT End of Session - 03/15/24 1609     Visit Number 94    Date for Recertification  04/10/24    Authorization Type HTA    PT Start Time 1609    PT Stop Time 1655    PT Time Calculation (min) 46 min    Equipment Utilized During Treatment Gait belt    Activity Tolerance Patient tolerated treatment well    Behavior During Therapy WFL for tasks assessed/performed                 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   HTN (hypertension) 07/19/2015   Hyperlipidemia    Kidney disease, chronic, stage III (GFR 30-59 ml/min) (HCC)    Major neurocognitive disorder 01/09/2014   Mild, related to stroke history   Nocturia    Parkinson disease (HCC)    Renal insufficiency 06/25/2013   S/P carotid endarterectomy    BILATERAL ICA--  PATENT PER DUPLEX  05-19-2012   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/26/2017   Right calf   Urinary frequency    Vitamin D  deficiency    Past Surgical History:  Procedure Laterality Date   APPENDECTOMY  AS CHILD   CARDIOVASCULAR STRESS TEST  03-27-2012  William [name]   LOW RISK LEXISCAN  STUDY-- PROBABLE NORMAL PERFUSION AND SOFT TISSUE ATTENUATION/  NO ISCHEMIA/ EF 51%   CAROTID ENDARTERECTOMY Bilateral LEFT  11-12-2008  William [name]   RIGHT ICA  2006  ([facility])   CHOLECYSTECTOMY N/A 02/23/2022   Procedure:  LAPAROSCOPIC CHOLECYSTECTOMY;  Surgeon: William Deward JINNY, MD;  Location: [facility] OR;  Service: General;  Laterality: N/A;   CYSTOSCOPY W/ RETROGRADES Bilateral 06/22/2021   Procedure: CYSTOSCOPY WITH RETROGRADE PYELOGRAM;  Surgeon: William Senior, MD;  Location: [facility];  Service: Urology;  Laterality: Bilateral;   CYSTOSCOPY WITH LITHOLAPAXY N/A 02/26/2013   Procedure: CYSTOSCOPY WITH LITHOLAPAXY;  Surgeon: [name] Matilda, MD;  Location: [facility];  Service: Urology;  Laterality: N/A;   ENDOSCOPIC RETROGRADE CHOLANGIOPANCREATOGRAPHY (ERCP) WITH PROPOFOL  N/A 02/22/2022   Procedure: ENDOSCOPIC RETROGRADE CHOLANGIOPANCREATOGRAPHY (ERCP) WITH PROPOFOL ;  Surgeon: William Dover, MD;  Location: [facility] ENDOSCOPY;  Service: Gastroenterology;  Laterality: N/A;   EYE SURGERY  Jan. 2016   cataract surgery both eyes   INGUINAL HERNIA REPAIR Right 11-08-2006   IR KYPHO EA ADDL LEVEL THORACIC OR LUMBAR  02/12/2021   IR RADIOLOGIST EVAL & MGMT  02/18/2021   MASS EXCISION N/A 03/03/2016   Procedure: EXCISION OF BACK  MASS;  Surgeon: William Nephew, MD;  Location: [facility];  Service: General;  Laterality: N/A;   MOHS SURGERY Left 1/ 2016   William [name]-- Basal cell   PROSTATE SURGERY     REMOVAL OF STONES  02/22/2022   Procedure:  REMOVAL OF STONES;  Surgeon: William Dover, MD;  Location: [facility] ENDOSCOPY;  Service: Gastroenterology;;   [name]  02/22/2022   Procedure: [name];  Surgeon: William Dover, MD;  Location: [facility] ENDOSCOPY;  Service: Gastroenterology;;   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 N/A 06/22/2021   Procedure: TRANSURETHRAL RESECTION OF BLADDER TUMOR;  Surgeon: William Senior, MD;  Location: [facility];  Service: Urology;  Laterality: N/A;   TRANSURETHRAL RESECTION OF PROSTATE N/A 02/26/2013   Procedure: TRANSURETHRAL RESECTION OF THE PROSTATE WITH GYRUS INSTRUMENTS;  Surgeon: [name] Matilda, MD;  Location:  [facility];  Service: Urology;  Laterality: N/A;   TRANSURETHRAL RESECTION OF PROSTATE N/A 06/22/2021   Procedure: TRANSURETHRAL RESECTION OF THE PROSTATE (TURP);  Surgeon: William Senior, MD;  Location: [facility];  Service: Urology;  Laterality: N/A;   Patient Active Problem List   Diagnosis Date Noted   Bronchitis 06/16/2023   Impacted cerumen of left ear 06/16/2023   Acute encephalopathy 04/18/2023   Parkinson's disease (HCC) 04/18/2023   History of CVA with residual deficit 04/18/2023   Chronic kidney disease, stage 3b (HCC) 04/18/2023   Dementia with behavioral disturbance (HCC) 04/18/2023   Anemia of chronic disease 04/18/2023   Laceration of right upper extremity 02/24/2023   Dermatitis 02/24/2023   Lung nodules 02/24/2023   Dysuria 08/03/2022   Nonintractable headache 07/01/2022   Bilateral impacted cerumen 06/24/2022   Rash 06/15/2022   Postoperative ileus (HCC) 02/27/2022   Ileus, postoperative (HCC) 02/26/2022   Choledocholithiasis 02/19/2022   DNR (do not resuscitate) 02/19/2022   Left elbow pain 01/26/2022   Mid back pain on left side 01/26/2022   Rib pain 01/26/2022   Acute pain of left shoulder 11/12/2021   Leukocytes in urine 11/12/2021   Urinary frequency 11/12/2021   Thrush 10/08/2021   Hemiplegia, 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   Insomnia    Prediabetes    Acute 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 (HCC)    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   Transaminitis 07/27/2021   UTI (urinary tract infection) 07/27/2021   CVA (cerebral vascular accident) (HCC) 07/27/2021   Fall 07/27/2021   Hyperglycemia 07/27/2021   Cholelithiasis 07/27/2021   Hypoxia 07/27/2021   Nausea and vomiting 07/27/2021   Acute metabolic encephalopathy 07/27/2021   Normocytic anemia 07/27/2021   Chronic back pain 07/27/2021   Malignant neoplasm of overlapping sites of bladder (HCC)  06/22/2021   Closed fracture of first lumbar vertebra with routine healing 02/03/2021   Closed fracture of multiple ribs 11/18/2020   Anxiety 01/29/2020   Leg pain, bilateral 01/29/2020   Ingrown toenail 07/13/2019   Lumbar spondylosis 05/02/2018   Pain in left knee 03/09/2018   Osteoarthritis of left hip 01/16/2018   Trochanteric bursitis of left hip 01/16/2018   Preventative health care 09/26/2017   HTN (hypertension) 07/19/2015   Hyperlipidemia 07/19/2015   Great toe pain 02/11/2014   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   Obesity (BMI 30-39.9) 06/25/2013   Renal insufficiency 06/25/2013   Weakness of left arm 06/25/2013   Sebaceous cyst 03/03/2011   Sprain of lumbar region 07/31/2010   Rib pain, left 08/29/2009   Carotid artery stenosis, asymptomatic, bilateral 05/02/2009   Eczema, atopic 05/31/2008   Vitamin D  deficiency 03/01/2008   BPH (benign prostatic hyperplasia) 08/06/2007   Fasting hyperglycemia 12/21/2006   History of right MCA infarct 2006    ONSET DATE: 06/29/22 REFERRING DIAG:  Diagnosis  I63.9 (ICD-10-CM) -  Cerebrovascular accident (CVA), unspecified mechanism (HCC)  W19.XXXD (ICD-10-CM) - Fall, subsequent encounter    THERAPY DIAG:  Muscle weakness (generalized)  Other abnormalities of gait and mobility  Hemiplegia and hemiparesis following cerebral infarction affecting left non-dominant side (HCC)  Difficulty in walking, not elsewhere classified  Repeated [name]  Acute pain of left shoulder  Rationale for Evaluation and Treatment: Rehabilitation  SUBJECTIVE:                                                                                                                                                                                         SUBJECTIVE STATEMENT:  Went to ED due to constipation, but otherwise doing okay  PERTINENT HISTORY:  William [name] is a 83 year old man with dementia, CKD, HTN, CAD, HLD and history of CVA   (2006, 2023), Parkinson's  PAIN:  Are you having pain? Faces/behavior scale- mild pain BLEs  and back   PRECAUTIONS: Fall  WEIGHT BEARING RESTRICTIONS: No  [name]: Has patient fallen in last 6 months? No  LIVING ENVIRONMENT: Lives with: lives with their family and lives with their spouse Lives in: House/apartment Stairs: 1 brick high step Has following equipment at home: Environmental consultant - 2 wheeled, Wheelchair (manual), Shower bench, bed side commode, Grab bars, and hospital bed, sliding pad for car  PLOF: Independent with basic ADLs and Independent with household mobility without device  PATIENT GOALS: Walk around the block with his wife. Be able to use the dining room chair rather than have to use the W/C. Increased I with bed mobility and simple tasks at home like make some coffee or brush his teeth, Step over tub to shower, has bench, but he can't really use it. Up and down the one step to get to back deck. 11/23/22: Patient's wife Devere updated his goals today: Walk around store (gets distracted), swing a golf club again (thinking of driving range), get up on his own, walk without leaning forward, more recognition of the left side (from CVA), not need gait belt, OT-- be able to get dressed more on his own, navigate the newspaper.  OBJECTIVE:   TREATMENT: 03/15/2024 Nustep level 5 x 5 minutes Bike level 4 x 5 minutes Dribbling ball Partial sit ups Ball kicks Walked out side with HHA 1/2 way around parking William [name] and then rest and then from there to the front door through some deep grass, this was very difficult for him, needed a lot of cues to take bigger steps and needed more assist, once we did this he had a hard time getting his feet and steps back. Did 240 feet HHA walking in the clinic  and then to the car after this to get the stepping pattern back  03/08/24 NuStep L5x  UBE L3 x29mins each way- hard time keeping hand on bar today AB isometric 2x10 Ball squeezes 2x10 Standing  ball dribbling Kicking different color cones Dribbling ball back and forth with wife   02/28/24 NuStep L5x50mins UBE L2 x4mins each way  Seated PWR moves- holding big red ball touching floor and reaching overhead, rotations holding big red ball Punches with 2# weights  LAQ 5# 2x12 Modified sit ups 2x10  02/21/24 Nustep level 5 x 9 minutes UBE level 3 x 5 minutes Worked with wife on the lift from floor chair, is this feasible and possible for William [name] to use.  I worked on trying to simulate this, with him having to sit up from supine with him going to long sitting.  Difficult for him due to tight HS, then can he push himself up onto a small seating surface, again very difficulty for him as he was falling backward Passive HS stretches Seated PWR moves and instructed his wife with these Gait out back door through the grass to the car  02/14/24 Nustep level 5 x 6 minutes Bike level 4 x 5 minutes UBE level 4 x 5 minutes STM to the left shoulder area mainly upper trap Some genlte PROM of the left shoulder Gait with HHA out the back door around one of the islands on the side of the back building back up the hill to the picnic table and rest and then through the grass on the side of the building to the front door, this was very fatiguing for him, a lot of cues verbal and physical to get him to stand up Gait out to the car  PATIENT EDUCATION: Education details: none new 02/16/23 Person educated: Patient and Spouse Education method: Explanation, Demonstration, Tactile cues, and Verbal cues Education comprehension: verbalized understanding, returned demonstration, verbal cues required, tactile cues required, and needs further education   ------------------------------------------------------------------------------------------------------- (Measures in this section from initial evaluation unless otherwise noted) DIAGNOSTIC FINDINGS:  MRI 2021 with degenerative changes in lumbar spine, L3-4 R  subarticular stenosis COGNITION: Overall cognitive status: History of cognitive impairments - at baseline SENSATION: Not tested COORDINATION: Moderately impaired BLE MUSCLE LENGTH: Hamstrings: severely restricted B William [name] test: Severely restricted B. POSTURE: rounded shoulders, forward head, increased thoracic kyphosis, posterior pelvic tilt, and flexed trunk  LOWER EXTREMITY ROM:   BLE extremely stiff throughout, limited hip ROM in all planes, B knees limited in extension. LOWER EXTREMITY MMT:  3+/5 throughout. Unable to determine accuracy of MMT due to cognition. Functionally demosntrates poor coordinated activation and poor muscular endurance. BED MOBILITY: Min A for Supine<> Sit. Patient was using a ladder in his hospital bed and could move MI. TRANSFERS: Min A, mod VC, tends to lean back.  11/02/22 independent with use of hands on arm chair with a couple of tries CURB: Min A-Has one step out to his deck. STAIRS: N/A  GAIT: Gait pattern: step to pattern, decreased arm swing- Right, decreased arm swing- Left, decreased step length- Right, decreased step length- Left, shuffling, festinating, trunk flexed, narrow BOS, poor foot clearance- Right, and poor foot clearance- Left Distance walked: 22' Assistive device utilized: Environmental consultant - 2 wheeled Level of assistance: Occasional min A Comments: mod VC to stay close to walker, take longer steps. FUNCTIONAL TESTS:  5 times sit to stand: 38.23  09/29/22  could not performs 5XSTS, 11/02/22 = 47 seconds with cues to use  arms, 07/21/23 really struggled today 55 seconds Timed up and go (TUG): 47.91 with RW, 07/21/22 TUG 29  seconds without device, 09/29/22 25 seconds, 11/02/22 TUG 20 seconds, 07/21/23 = 26 seconds no device SBA                                                                     PATIENT EDUCATION: Education details: Progress towards goals, POC Person educated: Patient and Spouse Education method: Explanation, Facilities manager, and Verbal cues   Education comprehension: verbalized understanding, returned demonstration, verbal cues required, and needs further education  HOME EXERCISE PROGRAM: PWR! moves  GOALS:  SHORT TERM GOALS: Target date: 01/25/24   Pt will perform HEP to maintain lower body strength, flexibility, posture with wife's assistance. Baseline:  Goal status: met 04/12/23  2. Pt will perform sit to stand, 8 of 10 reps with BUE support, with supervision, to maintain safety with transfers.  Baseline: min guard  Goal status: 02/07/23-CGA from elevated surface and max VC to lean forward, ongoing  continues to need a lot of cues for hand placement and to lean forward, tends to lean backward, needs cues and some assistance 02/28/24  LONG TERM GOALS: Target date: 04/10/24   Patient will perform HEP progression to maintain lower body strength, flexibility, posture with wife's assistance. Baseline:  Goal status: doing some walking with his wife when she feels safe, has done some pedaling at home as well 12/13/23, working on this , his wife was out of  town and with the heat has not been able to walk much this summer, ongoing 03/15/24  2.  Pt will ambulate 150-175 ft in 2 MWT with min guard assist, to preserve functional mobility within the home.  Baseline: Goal status:  met 08/25/23  3. Wife will verbalize tips for fall prevention education to minimize fall frequency Baseline:  Goal status:progressing and doing well 4.  Patient will be able to toilet without freezing for over a 4 week period met 11/15/23 5.  Patient will be able to get out of bed from supine with wife assist minimal Assist,met 10/06/23   ASSESSMENT:  CLINICAL IMPRESSION:  Patient went to the ED for constipation, I did a little more walking to help with this and he really struggled some getting his bigger steps.  Also had more shuffling today and had a few instances where he sped up and needed assistance to prevent falling.  OBJECTIVE IMPAIRMENTS: Abnormal  gait, decreased activity tolerance, decreased balance, decreased cognition, decreased coordination, decreased endurance, decreased mobility, difficulty walking, decreased ROM, decreased strength, decreased safety awareness, impaired flexibility, impaired UE functional use, improper body mechanics, and postural dysfunction.   PLAN:  PT FREQUENCY: 1x/week  PT DURATION: 12 weeks  PLANNED INTERVENTIONS: Therapeutic exercises, Therapeutic activity, Neuromuscular re-education, Balance training, Gait training, Patient/Family education, Self Care, Joint mobilization, Stair training, Moist heat, and Manual therapy  PLAN FOR NEXT SESSION:   Will continue to work with patient to help him and his wife stay living at home and with less difficulty   Patient Details  Name: GIANLUCA CHHIM MRN: [id number] Date of Birth: Dec 27, 1940 Referring Provider:  Antonio Meth, Jamee SAUNDERS, *

## 2024-03-20 ENCOUNTER — Encounter: Payer: Self-pay | Admitting: Physical Therapy

## 2024-03-20 ENCOUNTER — Ambulatory Visit: Admitting: Physical Therapy

## 2024-03-20 DIAGNOSIS — M6281 Muscle weakness (generalized): Secondary | ICD-10-CM | POA: Diagnosis not present

## 2024-03-20 DIAGNOSIS — R296 Repeated falls: Secondary | ICD-10-CM

## 2024-03-20 DIAGNOSIS — Z9181 History of falling: Secondary | ICD-10-CM

## 2024-03-20 DIAGNOSIS — R262 Difficulty in walking, not elsewhere classified: Secondary | ICD-10-CM

## 2024-03-20 DIAGNOSIS — M25512 Pain in left shoulder: Secondary | ICD-10-CM

## 2024-03-20 DIAGNOSIS — R2689 Other abnormalities of gait and mobility: Secondary | ICD-10-CM

## 2024-03-20 DIAGNOSIS — I69354 Hemiplegia and hemiparesis following cerebral infarction affecting left non-dominant side: Secondary | ICD-10-CM

## 2024-03-20 NOTE — Therapy (Signed)
 OUTPATIENT PHYSICAL THERAPY NEURO TREATMENT    Patient Name: William [name] MRN: [id number] DOB:[birth year], 83 y.o., male Today's Date: 03/20/2024  PCP: [name]  DO REFERRING PROVIDER: same   END OF SESSION:  PT End of Session - 03/20/24 1609     Visit Number 95    Date for Recertification  04/10/24    Authorization Type HTA    PT Start Time 1609    PT Stop Time 1655    PT Time Calculation (min) 46 min    Equipment Utilized During Treatment Gait belt    Activity Tolerance Patient tolerated treatment well    Behavior During Therapy WFL for tasks assessed/performed                 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   HTN (hypertension) 07/19/2015   Hyperlipidemia    Kidney disease, chronic, stage III (GFR 30-59 ml/min) (HCC)    Major neurocognitive disorder 01/09/2014   Mild, related to stroke history   Nocturia    Parkinson disease (HCC)    Renal insufficiency 06/25/2013   S/P carotid endarterectomy    BILATERAL ICA--  PATENT PER DUPLEX  05-19-2012   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/26/2017   Right calf   Urinary frequency    Vitamin D  deficiency    Past Surgical History:  Procedure Laterality Date   APPENDECTOMY  AS CHILD   CARDIOVASCULAR STRESS TEST  03-27-2012  DR CRENSHAW   LOW RISK LEXISCAN  STUDY-- PROBABLE NORMAL PERFUSION AND SOFT TISSUE ATTENUATION/  NO ISCHEMIA/ EF 51%   CAROTID ENDARTERECTOMY Bilateral LEFT  11-12-2008  DR GREG HAYES   RIGHT ICA  2006  ([facility])   CHOLECYSTECTOMY N/A 02/23/2022   Procedure:  LAPAROSCOPIC CHOLECYSTECTOMY;  Surgeon: [name], MD;  Location: [facility] OR;  Service: General;  Laterality: N/A;   CYSTOSCOPY W/ RETROGRADES Bilateral 06/22/2021   Procedure: CYSTOSCOPY WITH RETROGRADE PYELOGRAM;  Surgeon: [name], MD;  Location: [facility];  Service: Urology;  Laterality: Bilateral;   CYSTOSCOPY WITH LITHOLAPAXY N/A 02/26/2013   Procedure: CYSTOSCOPY WITH LITHOLAPAXY;  Surgeon: Senior Matilda, MD;  Location[facility];  Service: Urology;  Laterality: N/A;   ENDOSCOPIC RETROGRADE CHOLANGIOPANCREATOGRAPHY (ERCP) WITH PROPOFOL  N/A 02/22/2022   Procedure: ENDOSCOPIC RETROGRADE CHOLANGIOPANCREATOGRAPHY (ERCP) WITH PROPOFOL ;  Surgeon: [name], MD;  Location: [facility] ENDOSCOPY;  Service: Gastroenterology;  Laterality: N/A;   EYE SURGERY  Jan. 2016   cataract surgery both eyes   INGUINAL HERNIA REPAIR Right 11-08-2006   IR KYPHO EA ADDL LEVEL THORACIC OR LUMBAR  02/12/2021   IR RADIOLOGIST EVAL & MGMT  02/18/2021   MASS EXCISION N/A 03/03/2016   Procedure: EXCISION OF BACK  MASS;  Surgeon: [name], MD;  Location: [facility];  Service: General;  Laterality: N/A;   MOHS SURGERY Left 1/ 2016   [name]-- Basal cell   PROSTATE SURGERY     REMOVAL OF STONES  02/22/2022   Procedure:  REMOVAL OF STONES;  Surgeon: [name], MD;  Location: [facility] ENDOSCOPY;  Service: Gastroenterology;;   [name]  02/22/2022   Procedure: [name];  Surgeon: [name], MD;  Location: [facility] ENDOSCOPY;  Service: Gastroenterology;;   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 N/A 06/22/2021   Procedure: TRANSURETHRAL RESECTION OF BLADDER TUMOR;  Surgeon: [name], MD;  Location: [facility];  Service: Urology;  Laterality: N/A;   TRANSURETHRAL RESECTION OF PROSTATE N/A 02/26/2013   Procedure: TRANSURETHRAL RESECTION OF THE PROSTATE WITH GYRUS INSTRUMENTS;  Surgeon: Senior Matilda, MD;  Location:  [facility];  Service: Urology;  Laterality: N/A;   TRANSURETHRAL RESECTION OF PROSTATE N/A 06/22/2021   Procedure: TRANSURETHRAL RESECTION OF THE PROSTATE (TURP);  Surgeon: [name], MD;  Location: [facility];  Service: Urology;  Laterality: N/A;   Patient Active Problem List   Diagnosis Date Noted   Bronchitis 06/16/2023   Impacted cerumen of left ear 06/16/2023   Acute encephalopathy 04/18/2023   Parkinson's disease (HCC) 04/18/2023   History of CVA with residual deficit 04/18/2023   Chronic kidney disease, stage 3b (HCC) 04/18/2023   Dementia with behavioral disturbance (HCC) 04/18/2023   Anemia of chronic disease 04/18/2023   Laceration of right upper extremity 02/24/2023   Dermatitis 02/24/2023   Lung nodules 02/24/2023   Dysuria 08/03/2022   Nonintractable headache 07/01/2022   Bilateral impacted cerumen 06/24/2022   Rash 06/15/2022   Postoperative ileus (HCC) 02/27/2022   Ileus, postoperative (HCC) 02/26/2022   Choledocholithiasis 02/19/2022   DNR (do not resuscitate) 02/19/2022   Left elbow pain 01/26/2022   Mid back pain on left side 01/26/2022   Rib pain 01/26/2022   Acute pain of left shoulder 11/12/2021   Leukocytes in urine 11/12/2021   Urinary frequency 11/12/2021   Thrush 10/08/2021   Hemiplegia, 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   Insomnia    Prediabetes    Acute 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 (HCC)    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   Transaminitis 07/27/2021   UTI (urinary tract infection) 07/27/2021   CVA (cerebral vascular accident) (HCC) 07/27/2021   Fall 07/27/2021   Hyperglycemia 07/27/2021   Cholelithiasis 07/27/2021   Hypoxia 07/27/2021   Nausea and vomiting 07/27/2021   Acute metabolic encephalopathy 07/27/2021   Normocytic anemia 07/27/2021   Chronic back pain 07/27/2021   Malignant neoplasm of overlapping sites of bladder (HCC)  06/22/2021   Closed fracture of first lumbar vertebra with routine healing 02/03/2021   Closed fracture of multiple ribs 11/18/2020   Anxiety 01/29/2020   Leg pain, bilateral 01/29/2020   Ingrown toenail 07/13/2019   Lumbar spondylosis 05/02/2018   Pain in left knee 03/09/2018   Osteoarthritis of left hip 01/16/2018   Trochanteric bursitis of left hip 01/16/2018   Preventative health care 09/26/2017   HTN (hypertension) 07/19/2015   Hyperlipidemia 07/19/2015   Great toe pain 02/11/2014   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   Obesity (BMI 30-39.9) 06/25/2013   Renal insufficiency 06/25/2013   Weakness of left arm 06/25/2013   Sebaceous cyst 03/03/2011   Sprain of lumbar region 07/31/2010   Rib pain, left 08/29/2009   Carotid artery stenosis, asymptomatic, bilateral 05/02/2009   Eczema, atopic 05/31/2008   Vitamin D  deficiency 03/01/2008   BPH (benign prostatic hyperplasia) 08/06/2007   Fasting hyperglycemia 12/21/2006   History of right MCA infarct 2006    ONSET DATE: 06/29/22 REFERRING DIAG:  Diagnosis  I63.9 (ICD-10-CM) -  Cerebrovascular accident (CVA), unspecified mechanism (HCC)  W19.XXXD (ICD-10-CM) - Fall, subsequent encounter    THERAPY DIAG:  Muscle weakness (generalized)  Other abnormalities of gait and mobility  Hemiplegia and hemiparesis following cerebral infarction affecting left non-dominant side (HCC)  Difficulty in walking, not elsewhere classified  Repeated falls  Acute pain of left shoulder  History of falling  Rationale for Evaluation and Treatment: Rehabilitation  SUBJECTIVE:                                                                                                                                                                                         SUBJECTIVE STATEMENT:  Reports that after our walking the last visit with us  he was able to have a BM, no falls  PERTINENT HISTORY:  William [name] is a 83 year old man with  dementia, CKD, HTN, CAD, HLD and history of CVA  (2006, 2023), Parkinson's  PAIN:  Are you having pain? Faces/behavior scale- mild pain BLEs  and back   PRECAUTIONS: Fall  WEIGHT BEARING RESTRICTIONS: No  FALLS: Has patient fallen in last 6 months? No  LIVING ENVIRONMENT: Lives with: lives with their family and lives with their spouse Lives in: House/apartment Stairs: 1 brick high step Has following equipment at home: Environmental consultant - 2 wheeled, Wheelchair (manual), Shower bench, bed side commode, Grab bars, and hospital bed, sliding pad for car  PLOF: Independent with basic ADLs and Independent with household mobility without device  PATIENT GOALS: Walk around the block with his wife. Be able to use the dining room chair rather than have to use the W/C. Increased I with bed mobility and simple tasks at home like make some coffee or brush his teeth, Step over tub to shower, has bench, but he can't really use it. Up and down the one step to get to back deck. 11/23/22: Patient's wife Devere updated his goals today: Walk around store (gets distracted), swing a golf club again (thinking of driving range), get up on his own, walk without leaning forward, more recognition of the left side (from CVA), not need gait belt, OT-- be able to get dressed more on his own, navigate the newspaper.  OBJECTIVE:   TREATMENT: 03/20/24 Nustep level 5 x 6 minutes Bike level 4 x 5 minutes UBE level 3 x 4 minutes Gait outside 1/2 around the parking [name] rest and then through grass back to the front of the building some cues for step length Seated partial sit ups, worked on some trunk mobility trying to get him to lean side to side.  Ball bouncing in sitting and in standing.  Side stepping, gait 2  laps with HHA PROM of the left shoulder  03/15/2024 Nustep level 5 x 5 minutes Bike level 4 x 5 minutes Dribbling ball Partial sit ups Ball kicks Walked out side with HHA 1/2 way around parking William [name] and then rest and  then from there to the front door through some deep grass, this was very difficult for him, needed a lot of cues to take bigger steps and needed more assist, once we did this he had a hard time getting his feet and steps back. Did 240 feet HHA walking in the clinic and then to the car after this to get the stepping pattern back  03/08/24 NuStep L5x  UBE L3 x26mins each way- hard time keeping hand on bar today AB isometric 2x10 Ball squeezes 2x10 Standing ball dribbling Kicking different color cones Dribbling ball back and forth with wife   02/28/24 NuStep L5x52mins UBE L2 x5mins each way  Seated PWR moves- holding big red ball touching floor and reaching overhead, rotations holding big red ball Punches with 2# weights  LAQ 5# 2x12 Modified sit ups 2x10  02/21/24 Nustep level 5 x 9 minutes UBE level 3 x 5 minutes Worked with wife on the lift from floor chair, is this feasible and possible for William [name] to use.  I worked on trying to simulate this, with him having to sit up from supine with him going to long sitting.  Difficult for him due to tight HS, then can he push himself up onto a small seating surface, again very difficulty for him as he was falling backward Passive HS stretches Seated PWR moves and instructed his wife with these Gait out back door through the grass to the car  02/14/24 Nustep level 5 x 6 minutes Bike level 4 x 5 minutes UBE level 4 x 5 minutes STM to the left shoulder area mainly upper trap Some genlte PROM of the left shoulder Gait with HHA out the back door around one of the islands on the side of the back building back up the hill to the picnic table and rest and then through the grass on the side of the building to the front door, this was very fatiguing for him, a lot of cues verbal and physical to get him to stand up Gait out to the car  PATIENT EDUCATION: Education details: none new 02/16/23 Person educated: Patient and Spouse Education method: Explanation,  Demonstration, Tactile cues, and Verbal cues Education comprehension: verbalized understanding, returned demonstration, verbal cues required, tactile cues required, and needs further education   ------------------------------------------------------------------------------------------------------- (Measures in this section from initial evaluation unless otherwise noted) DIAGNOSTIC FINDINGS:  MRI 2021 with degenerative changes in lumbar spine, L3-4 R subarticular stenosis COGNITION: Overall cognitive status: History of cognitive impairments - at baseline SENSATION: Not tested COORDINATION: Moderately impaired BLE MUSCLE LENGTH: Hamstrings: severely restricted B Thomas test: Severely restricted B. POSTURE: rounded shoulders, forward head, increased thoracic kyphosis, posterior pelvic tilt, and flexed trunk  LOWER EXTREMITY ROM:   BLE extremely stiff throughout, limited hip ROM in all planes, B knees limited in extension. LOWER EXTREMITY MMT:  3+/5 throughout. Unable to determine accuracy of MMT due to cognition. Functionally demosntrates poor coordinated activation and poor muscular endurance. BED MOBILITY: Min A for Supine<> Sit. Patient was using a ladder in his hospital bed and could move MI. TRANSFERS: Min A, mod VC, tends to lean back.  11/02/22 independent with use of hands on arm chair with a couple of tries CURB: Min A-Has one  step out to his deck. STAIRS: N/A  GAIT: Gait pattern: step to pattern, decreased arm swing- Right, decreased arm swing- Left, decreased step length- Right, decreased step length- Left, shuffling, festinating, trunk flexed, narrow BOS, poor foot clearance- Right, and poor foot clearance- Left Distance walked: 24' Assistive device utilized: Environmental consultant - 2 wheeled Level of assistance: Occasional min A Comments: mod VC to stay close to walker, take longer steps. FUNCTIONAL TESTS:  5 times sit to stand: 38.23  09/29/22  could not performs 5XSTS, 11/02/22 = 47 seconds  with cues to use arms, 07/21/23 really struggled today 55 seconds Timed up and go (TUG): 47.91 with RW, 07/21/22 TUG 29  seconds without device, 09/29/22 25 seconds, 11/02/22 TUG 20 seconds, 07/21/23 = 26 seconds no device SBA                                                                     PATIENT EDUCATION: Education details: Progress towards goals, POC Person educated: Patient and Spouse Education method: Programmer, multimedia, Facilities manager, and Verbal cues  Education comprehension: verbalized understanding, returned demonstration, verbal cues required, and needs further education  HOME EXERCISE PROGRAM: PWR! moves  GOALS:  SHORT TERM GOALS: Target date: 01/25/24   Pt will perform HEP to maintain lower body strength, flexibility, posture with wife's assistance. Baseline:  Goal status: met 04/12/23  2. Pt will perform sit to stand, 8 of 10 reps with BUE support, with supervision, to maintain safety with transfers.  Baseline: min guard  Goal status: 02/07/23-CGA from elevated surface and max VC to lean forward, ongoing  continues to need a lot of cues for hand placement and to lean forward, tends to lean backward, needs cues and some assistance 02/28/24  LONG TERM GOALS: Target date: 04/10/24   Patient will perform HEP progression to maintain lower body strength, flexibility, posture with wife's assistance. Baseline:  Goal status: doing some walking with his wife when she feels safe, has done some pedaling at home as well 12/13/23, working on this , his wife was out of  town and with the heat has not been able to walk much this summer, ongoing 03/15/24  2.  Pt will ambulate 150-175 ft in 2 MWT with min guard assist, to preserve functional mobility within the home.  Baseline: Goal status:  met 08/25/23  3. Wife will verbalize tips for fall prevention education to minimize fall frequency Baseline:  Goal status:progressing and doing well 4.  Patient will be able to toilet without freezing for over a 4  week period met 11/15/23 5.  Patient will be able to get out of bed from supine with wife assist minimal Assist,met 10/06/23   ASSESSMENT:  CLINICAL IMPRESSION:  Patient doing well today, his walking was better with more normal size steps, still difficulty standing from sitting, needing cues for nose over toes and to push with hands, he his pushing backwards more with sit to stand now OBJECTIVE IMPAIRMENTS: Abnormal gait, decreased activity tolerance, decreased balance, decreased cognition, decreased coordination, decreased endurance, decreased mobility, difficulty walking, decreased ROM, decreased strength, decreased safety awareness, impaired flexibility, impaired UE functional use, improper body mechanics, and postural dysfunction.   PLAN:  PT FREQUENCY: 1x/week  PT DURATION: 12 weeks  PLANNED INTERVENTIONS: Therapeutic exercises, Therapeutic  activity, Neuromuscular re-education, Balance training, Gait training, Patient/Family education, Self Care, Joint mobilization, Stair training, Moist heat, and Manual therapy  PLAN FOR NEXT SESSION:   Will continue to work with patient to help him and his wife stay living at home and with less difficulty   Patient Details  Name: William [name] MRN: [id number] Date of Birth: 1941-02-06 Referring Provider:  Antonio Meth, Jamee SAUNDERS, *

## 2024-03-22 ENCOUNTER — Ambulatory Visit: Admitting: Podiatry

## 2024-03-22 ENCOUNTER — Encounter: Payer: Self-pay | Admitting: Podiatry

## 2024-03-22 VITALS — Ht 69.0 in | Wt 146.4 lb

## 2024-03-22 DIAGNOSIS — M79676 Pain in unspecified toe(s): Secondary | ICD-10-CM

## 2024-03-22 DIAGNOSIS — Z7901 Long term (current) use of anticoagulants: Secondary | ICD-10-CM | POA: Diagnosis not present

## 2024-03-22 DIAGNOSIS — B351 Tinea unguium: Secondary | ICD-10-CM

## 2024-03-22 NOTE — Progress Notes (Signed)
 Subjective: Chief Complaint  Patient presents with   Nail Problem    Pt is here for RFC.     83 y.o. returns the office today for painful, elongated, thickened toenails which he cannot trim himself.    He has been complaining of his heels at times as they get dry. No open lesions or injuries.   He is on Plavix .  PCP: [name], DO Last seen 12/20/2023  Objective: NAD, presents with his wife DP/PT pulses palpable, CRT less than 3 seconds  Nails hypertrophic, dystrophic, elongated, brittle, discolored x 10.  Incurvation of the hallux toenails without any edema, erythema or signs of infection.  There is tenderness overlying the nails 1-5 bilaterally.  No swelling around the nails, redness or drainage or any signs of infection. Mild dry skin present on the lateral aspect of the heels bilaterally but no ulcerations.  This has not worsened. Not able to appreciate any tenderness today. Hammertoes present No open lesions No pain with calf compression, swelling, warmth, erythema.  Assessment: Patient presents with symptomatic onychomycosis  Plan: -Treatment options including alternatives, risks, complications were discussed -Nails debrided x 10 without any complications or bleeding.   -Advised continued foot checks -Continue moisturizer to the heels daily.   Return in about 3 months (around 06/22/2024) for nail trim .  Donnice [name] Fees DPM

## 2024-03-23 ENCOUNTER — Other Ambulatory Visit (HOSPITAL_COMMUNITY): Payer: Self-pay | Admitting: Psychiatry

## 2024-03-23 ENCOUNTER — Other Ambulatory Visit: Payer: Self-pay

## 2024-03-27 ENCOUNTER — Other Ambulatory Visit: Payer: Self-pay

## 2024-03-27 ENCOUNTER — Ambulatory Visit: Admitting: Physical Therapy

## 2024-03-27 ENCOUNTER — Encounter: Payer: Self-pay | Admitting: Physical Therapy

## 2024-03-27 DIAGNOSIS — Z9181 History of falling: Secondary | ICD-10-CM

## 2024-03-27 DIAGNOSIS — R296 Repeated falls: Secondary | ICD-10-CM

## 2024-03-27 DIAGNOSIS — R2689 Other abnormalities of gait and mobility: Secondary | ICD-10-CM

## 2024-03-27 DIAGNOSIS — M6281 Muscle weakness (generalized): Secondary | ICD-10-CM

## 2024-03-27 DIAGNOSIS — I69354 Hemiplegia and hemiparesis following cerebral infarction affecting left non-dominant side: Secondary | ICD-10-CM

## 2024-03-27 DIAGNOSIS — R262 Difficulty in walking, not elsewhere classified: Secondary | ICD-10-CM

## 2024-03-27 DIAGNOSIS — M25512 Pain in left shoulder: Secondary | ICD-10-CM

## 2024-03-27 NOTE — Therapy (Signed)
 OUTPATIENT PHYSICAL THERAPY NEURO TREATMENT    Patient Name: William [name] MRN: [id number] DOB:November 13, 1940, 83 y.o., male Today's Date: 03/27/2024  PCP: [name]  DO REFERRING PROVIDER: same   END OF SESSION:  PT End of Session - 03/27/24 1534     Visit Number 96    Date for Recertification  04/10/24    Authorization Type HTA    PT Start Time 1530    PT Stop Time 1614    PT Time Calculation (min) 44 min    Activity Tolerance Patient tolerated treatment well    Behavior During Therapy [facility] for tasks assessed/performed                 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   HTN (hypertension) 07/19/2015   Hyperlipidemia    Kidney disease, chronic, stage III (GFR 30-59 ml/min) (HCC)    Major neurocognitive disorder 01/09/2014   Mild, related to stroke history   Nocturia    Parkinson disease (HCC)    Renal insufficiency 06/25/2013   S/P carotid endarterectomy    BILATERAL ICA--  PATENT PER DUPLEX  05-19-2012   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/26/2017   Right calf   Urinary frequency    Vitamin D  deficiency    Past Surgical History:  Procedure Laterality Date   APPENDECTOMY  AS CHILD   CARDIOVASCULAR STRESS TEST  03-27-2012  DR CRENSHAW   LOW RISK LEXISCAN  STUDY-- PROBABLE NORMAL PERFUSION AND SOFT TISSUE ATTENUATION/  NO ISCHEMIA/ EF 51%   CAROTID ENDARTERECTOMY Bilateral LEFT  11-12-2008  DR GREG HAYES   RIGHT ICA  2006  ([facility])   CHOLECYSTECTOMY N/A 02/23/2022   Procedure: LAPAROSCOPIC CHOLECYSTECTOMY;  Surgeon:  [name], MD;  Location: [facility] OR;  Service: General;  Laterality: N/A;   CYSTOSCOPY W/ RETROGRADES Bilateral 06/22/2021   Procedure: CYSTOSCOPY WITH RETROGRADE PYELOGRAM;  Surgeon: [name], MD;  Location: [facility] [place];  Service: Urology;  Laterality: Bilateral;   CYSTOSCOPY WITH LITHOLAPAXY N/A 02/26/2013   Procedure: CYSTOSCOPY WITH LITHOLAPAXY;  Surgeon: Senior Matilda, MD;  Location: [facility];  Service: Urology;  Laterality: N/A;   ENDOSCOPIC RETROGRADE CHOLANGIOPANCREATOGRAPHY (ERCP) WITH PROPOFOL  N/A 02/22/2022   Procedure: ENDOSCOPIC RETROGRADE CHOLANGIOPANCREATOGRAPHY (ERCP) WITH PROPOFOL ;  Surgeon: [name], MD;  Location: [facility] ENDOSCOPY;  Service: Gastroenterology;  Laterality: N/A;   EYE SURGERY  Jan. 2016   cataract surgery both eyes   INGUINAL HERNIA REPAIR Right 11-08-2006   IR KYPHO EA ADDL LEVEL THORACIC OR LUMBAR  02/12/2021   IR RADIOLOGIST EVAL & MGMT  02/18/2021   MASS EXCISION N/A 03/03/2016   Procedure: EXCISION OF BACK  MASS;  Surgeon: [name], MD;  Location: [facility];  Service: General;  Laterality: N/A;   MOHS SURGERY Left 1/ 2016   [name]-- Basal cell   PROSTATE SURGERY     REMOVAL OF STONES  02/22/2022   Procedure: REMOVAL OF STONES;  Surgeon: [name], MD;  Location: [facility] ENDOSCOPY;  Service: Gastroenterology;;   [name]  02/22/2022   Procedure: [name];  Surgeon: [name], MD;  Location: [facility] END[facility]COPY;  Service: Gastroenterology;;   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 N/A 06/22/2021   Procedure: TRANSURETHRAL RESECTION OF BLADDER TUMOR;  Surgeon: [name], MD;  Location: [facility];  Service: Urology;  Laterality: N/A;   TRANSURETHRAL RESECTION OF PROSTATE N/A 02/26/2013   Procedure: TRANSURETHRAL RESECTION OF THE PROSTATE WITH GYRUS INSTRUMENTS;  Surgeon: Senior Matilda, MD;  Location: [facility];  Service:  Urology;  Laterality: N/A;   TRANSURETHRAL RESECTION OF PROSTATE N/A 06/22/2021   Procedure: TRANSURETHRAL RESECTION OF THE PROSTATE (TURP);  Surgeon: [name], MD;  Location: [facility]  Service: Urology;  Laterality: N/A;   Patient Active Problem List   Diagnosis Date Noted   Bronchitis 06/16/2023   Impacted cerumen of left ear 06/16/2023   Acute encephalopathy 04/18/2023   Parkinson's disease (HCC) 04/18/2023   History of CVA with residual deficit 04/18/2023   Chronic kidney disease, stage 3b (HCC) 04/18/2023   Dementia with behavioral disturbance (HCC) 04/18/2023   Anemia of chronic disease 04/18/2023   Laceration of right upper extremity 02/24/2023   Dermatitis 02/24/2023   Lung nodules 02/24/2023   Dysuria 08/03/2022   Nonintractable headache 07/01/2022   Bilateral impacted cerumen 06/24/2022   Rash 06/15/2022   Postoperative ileus (HCC) 02/27/2022   Ileus, postoperative (HCC) 02/26/2022   Choledocholithiasis 02/19/2022   DNR (do not resuscitate) 02/19/2022   Left elbow pain 01/26/2022   Mid back pain on left side 01/26/2022   Rib pain 01/26/2022   Acute pain of left shoulder 11/12/2021   Leukocytes in urine 11/12/2021   Urinary frequency 11/12/2021   Thrush 10/08/2021   Hemiplegia, 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   Insomnia    Prediabetes    Acute 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 (HCC)    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   Transaminitis 07/27/2021   UTI (urinary tract infection) 07/27/2021   CVA (cerebral vascular accident) (HCC) 07/27/2021   Fall 07/27/2021   Hyperglycemia 07/27/2021   Cholelithiasis 07/27/2021   Hypoxia 07/27/2021   Nausea and vomiting 07/27/2021   Acute metabolic encephalopathy 07/27/2021   Normocytic anemia 07/27/2021   Chronic back pain 07/27/2021   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   Closed fracture of first  lumbar vertebra with routine healing 02/03/2021   Closed fracture of multiple ribs 11/18/2020   Anxiety 01/29/2020   Leg pain, bilateral 01/29/2020   Ingrown toenail 07/13/2019   Lumbar spondylosis 05/02/2018   Pain in left knee 03/09/2018   Osteoarthritis of left hip 01/16/2018   Trochanteric bursitis of left hip 01/16/2018   Preventative health care 09/26/2017   HTN (hypertension) 07/19/2015   Hyperlipidemia 07/19/2015   Great toe pain 02/11/2014   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   Obesity (BMI 30-39.9) 06/25/2013   Renal insufficiency 06/25/2013   Weakness of left arm 06/25/2013   Sebaceous cyst 03/03/2011   Sprain of lumbar region 07/31/2010   Rib pain, left 08/29/2009   Carotid artery stenosis, asymptomatic, bilateral 05/02/2009   Eczema, atopic 05/31/2008   Vitamin D  deficiency 03/01/2008   BPH (benign prostatic hyperplasia) 08/06/2007   Fasting hyperglycemia 12/21/2006   History of right MCA infarct 2006    ONSET DATE: 06/29/22 REFERRING DIAG:  Diagnosis  I63.9 (ICD-10-CM) - Cerebrovascular accident (CVA), unspecified mechanism (HCC)  W19.XXXD (  ICD-10-CM) - Fall, subsequent encounter    THERAPY DIAG:  Muscle weakness (generalized)  Other abnormalities of gait and mobility  Hemiplegia and hemiparesis following cerebral infarction affecting left non-dominant side (HCC)  Difficulty in walking, not elsewhere classified  Repeated falls  Acute pain of left shoulder  History of falling  Rationale for Evaluation and Treatment: Rehabilitation  SUBJECTIVE:                                                                                                                                                                                         SUBJECTIVE STATEMENT:  Doing okay, wife reports that the left shoulder has been bothering him, transfers have been doing well and they have been trying to walk some  PERTINENT HISTORY:  William [name] is a 83 year  old man with dementia, CKD, HTN, CAD, HLD and history of CVA  (2006, 2023), Parkinson's  PAIN:  Are you having pain? Faces/behavior scale- mild pain BLEs  and back   PRECAUTIONS: Fall  WEIGHT BEARING RESTRICTIONS: No  FALLS: Has patient fallen in last 6 months? No  LIVING ENVIRONMENT: Lives with: lives with their family and lives with their spouse Lives in: House/apartment Stairs: 1 brick high step Has following equipment at home: Environmental consultant - 2 wheeled, Wheelchair (manual), Shower bench, bed side commode, Grab bars, and hospital bed, sliding pad for car  PLOF: Independent with basic ADLs and Independent with household mobility without device  PATIENT GOALS: Walk around the block with his wife. Be able to use the dining room chair rather than have to use the W/C. Increased I with bed mobility and simple tasks at home like make some coffee or brush his teeth, Step over tub to shower, has bench, but he can't really use it. Up and down the one step to get to back deck. 11/23/22: Patient's wife Devere updated his goals today: Walk around store (gets distracted), swing a golf club again (thinking of driving range), get up on his own, walk without leaning forward, more recognition of the left side (from CVA), not need gait belt, OT-- be able to get dressed more on his own, navigate the newspaper.  OBJECTIVE:   TREATMENT: 03/27/24 Nustep level 5 x 6 minutes Bike level 5 x 5 minutes STM with the tgun to the left upper trap area Passive ROM to the left shoulder all motions to his tolerance UBE level 4 x 4 minutes Gait outside with HHA 1/2 the parking [place] and then rest and then around to the front of the building on the smooth surface Seated left LAQ, marches Standing ball bouncing Standing HHA side stepping and  backward walking  03/20/24 Nustep level 5 x 6 minutes Bike level 4 x 5 minutes UBE level 3 x 4 minutes Gait outside 1/2 around the parking island rest and then through grass back to  the front of the building some cues for step length Seated partial sit ups, worked on some trunk mobility trying to get him to lean side to side.  Ball bouncing in sitting and in standing.  Side stepping, gait 2 laps with HHA PROM of the left shoulder  03/15/2024 Nustep level 5 x 5 minutes Bike level 4 x 5 minutes Dribbling ball Partial sit ups Ball kicks Walked out side with HHA 1/2 way around parking [place] and then rest and then from there to the front door through some deep grass, this was very difficult for him, needed a lot of cues to take bigger steps and needed more assist, once we did this he had a hard time getting his feet and steps back. Did 240 feet HHA walking in the clinic and then to the car after this to get the stepping pattern back  03/08/24 NuStep L5x  UBE L3 x83mins each way- hard time keeping hand on bar today AB isometric 2x10 Ball squeezes 2x10 Standing ball dribbling Kicking different color cones Dribbling ball back and forth with wife   02/28/24 NuStep L5x12mins UBE L2 x71mins each way  Seated PWR moves- holding big red ball touching floor and reaching overhead, rotations holding big red ball Punches with 2# weights  LAQ 5# 2x12 Modified sit ups 2x10  02/21/24 Nustep level 5 x 9 minutes UBE level 3 x 5 minutes Worked with wife on the lift from floor chair, is this feasible and possible for Lee to use.  I worked on trying to simulate this, with him having to sit up from supine with him going to long sitting.  Difficult for him due to tight HS, then can he push himself up onto a small seating surface, again very difficulty for him as he was falling backward Passive HS stretches Seated PWR moves and instructed his wife with these Gait out back door through the grass to the car  02/14/24 Nustep level 5 x 6 minutes Bike level 4 x 5 minutes UBE level 4 x 5 minutes STM to the left shoulder area mainly upper trap Some genlte PROM of the left shoulder Gait  with HHA out the back door around one of the islands on the side of the back building back up the hill to the picnic table and rest and then through the grass on the side of the building to the front door, this was very fatiguing for him, a lot of cues verbal and physical to get him to stand up Gait out to the car  PATIENT EDUCATION: Education details: none new 02/16/23 Person educated: Patient and Spouse Education method: Explanation, Demonstration, Tactile cues, and Verbal cues Education comprehension: verbalized understanding, returned demonstration, verbal cues required, tactile cues required, and needs further education   ------------------------------------------------------------------------------------------------------- (Measures in this section from initial evaluation unless otherwise noted) DIAGNOSTIC FINDINGS:  MRI 2021 with degenerative changes in lumbar spine, L3-4 R subarticular stenosis COGNITION: Overall cognitive status: History of cognitive impairments - at baseline SENSATION: Not tested COORDINATION: Moderately impaired BLE MUSCLE LENGTH: Hamstrings: severely restricted B Thomas test: Severely restricted B. POSTURE: rounded shoulders, forward head, increased thoracic kyphosis, posterior pelvic tilt, and flexed trunk  LOWER EXTREMITY ROM:   BLE extremely stiff throughout, limited hip ROM in all planes,  B knees limited in extension. LOWER EXTREMITY MMT:  3+/5 throughout. Unable to determine accuracy of MMT due to cognition. Functionally demosntrates poor coordinated activation and poor muscular endurance. BED MOBILITY: Min A for Supine<> Sit. Patient was using a ladder in his hospital bed and could move MI. TRANSFERS: Min A, mod VC, tends to lean back.  11/02/22 independent with use of hands on arm chair with a couple of tries CURB: Min A-Has one step out to his deck. STAIRS: N/A  GAIT: Gait pattern: step to pattern, decreased arm swing- Right, decreased arm swing- Left,  decreased step length- Right, decreased step length- Left, shuffling, festinating, trunk flexed, narrow BOS, poor foot clearance- Right, and poor foot clearance- Left Distance walked: 14' Assistive device utilized: Environmental consultant - 2 wheeled Level of assistance: Occasional min A Comments: mod VC to stay close to walker, take longer steps. FUNCTIONAL TESTS:  5 times sit to stand: 38.23  09/29/22  could not performs 5XSTS, 11/02/22 = 47 seconds with cues to use arms, 07/21/23 really struggled today 55 seconds Timed up and go (TUG): 47.91 with RW, 07/21/22 TUG 29  seconds without device, 09/29/22 25 seconds, 11/02/22 TUG 20 seconds, 07/21/23 = 26 seconds no device SBA                                                                     PATIENT EDUCATION: Education details: Progress towards goals, POC Person educated: Patient and Spouse Education method: Programmer, multimedia, Facilities manager, and Verbal cues  Education comprehension: verbalized understanding, returned demonstration, verbal cues required, and needs further education  HOME EXERCISE PROGRAM: PWR! moves  GOALS:  SHORT TERM GOALS: Target date: 01/25/24   Pt will perform HEP to maintain lower body strength, flexibility, posture with wife's assistance. Baseline:  Goal status: met 04/12/23  2. Pt will perform sit to stand, 8 of 10 reps with BUE support, with supervision, to maintain safety with transfers.  Baseline: min guard  Goal status: 02/07/23-CGA from elevated surface and max VC to lean forward, ongoing  continues to need a lot of cues for hand placement and to lean forward, tends to lean backward, needs cues and some assistance 02/28/24  LONG TERM GOALS: Target date: 04/10/24   Patient will perform HEP progression to maintain lower body strength, flexibility, posture with wife's assistance. Baseline:  Goal status: has been able to walk with wife multiple times a day 03/27/24  2.  Pt will ambulate 150-175 ft in 2 MWT with min guard assist, to preserve  functional mobility within the home.  Baseline: Goal status:  met 08/25/23  3. Wife will verbalize tips for fall prevention education to minimize fall frequency Baseline:  Goal status:progressing and doing well 4.  Patient will be able to toilet without freezing for over a 4 week period met 11/15/23 5.  Patient will be able to get out of bed from supine with wife assist minimal Assist,met 10/06/23   ASSESSMENT:  CLINICAL IMPRESSION:  Patient doing well today, his walking was better with more normal size steps, He will repeat that he cannot take bigger steps but will with a light pull on him, with transitional motions, doorways turns he does some festenating but is mild.  Wife reports that the in and out of  bed has been better recently OBJECTIVE IMPAIRMENTS: Abnormal gait, decreased activity tolerance, decreased balance, decreased cognition, decreased coordination, decreased endurance, decreased mobility, difficulty walking, decreased ROM, decreased strength, decreased safety awareness, impaired flexibility, impaired UE functional use, improper body mechanics, and postural dysfunction.   PLAN:  PT FREQUENCY: 1x/week  PT DURATION: 12 weeks  PLANNED INTERVENTIONS: Therapeutic exercises, Therapeutic activity, Neuromuscular re-education, Balance training, Gait training, Patient/Family education, Self Care, Joint mobilization, Stair training, Moist heat, and Manual therapy  PLAN FOR NEXT SESSION:   Will continue to work with patient to help him and his wife stay living at home and with less difficulty   Patient Details  Name: William [name] MRN: [id number] Date of Birth: November 29, 1940 Referring Provider:  Antonio Meth, Jamee SAUNDERS, *

## 2024-03-29 ENCOUNTER — Other Ambulatory Visit (HOSPITAL_BASED_OUTPATIENT_CLINIC_OR_DEPARTMENT_OTHER): Payer: Self-pay

## 2024-03-29 MED ORDER — FLUZONE HIGH-DOSE 0.5 ML IM SUSY
0.5000 mL | PREFILLED_SYRINGE | Freq: Once | INTRAMUSCULAR | 0 refills | Status: AC
  Filled 2024-03-29: qty 0.5, 1d supply, fill #0

## 2024-03-29 MED ORDER — COMIRNATY 30 MCG/0.3ML IM SUSY
0.3000 mL | PREFILLED_SYRINGE | Freq: Once | INTRAMUSCULAR | 0 refills | Status: AC
  Filled 2024-03-29: qty 0.3, 1d supply, fill #0

## 2024-04-03 ENCOUNTER — Other Ambulatory Visit (HOSPITAL_BASED_OUTPATIENT_CLINIC_OR_DEPARTMENT_OTHER): Payer: Self-pay

## 2024-04-05 ENCOUNTER — Other Ambulatory Visit (HOSPITAL_COMMUNITY): Payer: Self-pay

## 2024-04-05 ENCOUNTER — Ambulatory Visit: Admitting: Physical Therapy

## 2024-04-05 ENCOUNTER — Other Ambulatory Visit (HOSPITAL_BASED_OUTPATIENT_CLINIC_OR_DEPARTMENT_OTHER): Payer: Self-pay

## 2024-04-05 ENCOUNTER — Encounter: Payer: Self-pay | Admitting: Physical Therapy

## 2024-04-05 DIAGNOSIS — I69354 Hemiplegia and hemiparesis following cerebral infarction affecting left non-dominant side: Secondary | ICD-10-CM

## 2024-04-05 DIAGNOSIS — R296 Repeated falls: Secondary | ICD-10-CM

## 2024-04-05 DIAGNOSIS — Z9181 History of falling: Secondary | ICD-10-CM

## 2024-04-05 DIAGNOSIS — M6281 Muscle weakness (generalized): Secondary | ICD-10-CM

## 2024-04-05 DIAGNOSIS — R262 Difficulty in walking, not elsewhere classified: Secondary | ICD-10-CM

## 2024-04-05 DIAGNOSIS — R2689 Other abnormalities of gait and mobility: Secondary | ICD-10-CM

## 2024-04-05 DIAGNOSIS — M25512 Pain in left shoulder: Secondary | ICD-10-CM

## 2024-04-05 DIAGNOSIS — R278 Other lack of coordination: Secondary | ICD-10-CM

## 2024-04-05 NOTE — Therapy (Signed)
 OUTPATIENT PHYSICAL THERAPY NEURO TREATMENT    Patient Name: William [name] MRN: [id number] DOB:Jul 01, 1940, 83 y.o., male Today's Date: 04/05/2024  PCP: [name]  DO REFERRING PROVIDER: same   END OF SESSION:  PT End of Session - 04/05/24 1435     Visit Number 97    Date for Recertification  04/10/24    Authorization Type HTA    PT Start Time 1436    PT Stop Time 1525    PT Time Calculation (min) 49 min    Activity Tolerance Patient tolerated treatment well    Behavior During Therapy [facility] for tasks assessed/performed                 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   HTN (hypertension) 07/19/2015   Hyperlipidemia    Kidney disease, chronic, stage III (GFR 30-59 ml/min) (HCC)    Major neurocognitive disorder 01/09/2014   Mild, related to stroke history   Nocturia    Parkinson disease (HCC)    Renal insufficiency 06/25/2013   S/P carotid endarterectomy    BILATERAL ICA--  PATENT PER DUPLEX  05-19-2012   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/26/2017   Right calf   Urinary frequency    Vitamin D  deficiency    Past Surgical History:  Procedure Laterality Date   APPENDECTOMY  AS CHILD   CARDIOVASCULAR STRESS TEST  03-27-2012  DR CRENSHAW   LOW RISK LEXISCAN  STUDY-- PROBABLE NORMAL PERFUSION AND SOFT TISSUE ATTENUATION/  NO ISCHEMIA/ EF 51%   CAROTID ENDARTERECTOMY Bilateral LEFT  11-12-2008  DR GREG HAYES   RIGHT ICA  2006  ([facility])   CHOLECYSTECTOMY N/A 02/23/2022   Procedure: LAPAROSCOPIC CHOLECYSTECTOMY;  Surgeon:  [name], MD;  Location: [facility] OR;  Service: General;  Laterality: N/A;   CYSTOSCOPY W/ RETROGRADES Bilateral 06/22/2021   Procedure: CYSTOSCOPY WITH RETROGRADE PYELOGRAM;  Surgeon: [name], MD;  Location: [facility] [place];  Service: Urology;  Laterality: Bilateral;   CYSTOSCOPY WITH LITHOLAPAXY N/A 02/26/2013   Procedure: CYSTOSCOPY WITH LITHOLAPAXY;  Surgeon: Senior Matilda, MD;  Location: [facility];  Service: Urology;  Laterality: N/A;   ENDOSCOPIC RETROGRADE CHOLANGIOPANCREATOGRAPHY (ERCP) WITH PROPOFOL  N/A 02/22/2022   Procedure: ENDOSCOPIC RETROGRADE CHOLANGIOPANCREATOGRAPHY (ERCP) WITH PROPOFOL ;  Surgeon: [name], MD;  Location: [facility] ENDOSCOPY;  Service: Gastroenterology;  Laterality: N/A;   EYE SURGERY  Jan. 2016   cataract surgery both eyes   INGUINAL HERNIA REPAIR Right 11-08-2006   IR KYPHO EA ADDL LEVEL THORACIC OR LUMBAR  02/12/2021   IR RADIOLOGIST EVAL & MGMT  02/18/2021   MASS EXCISION N/A 03/03/2016   Procedure: EXCISION OF BACK  MASS;  Surgeon: [name], MD;  Location: [facility];  Service: General;  Laterality: N/A;   MOHS SURGERY Left 1/ 2016   [name]-- Basal cell   PROSTATE SURGERY     REMOVAL OF STONES  02/22/2022   Procedure: REMOVAL OF STONES;  Surgeon: [name], MD;  Location: [facility] ENDOSCOPY;  Service: Gastroenterology;;   [name]  02/22/2022   Procedure: [name];  Surgeon: [name], MD;  Location: [facility] END[facility]COPY;  Service: Gastroenterology;;   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 N/A 06/22/2021   Procedure: TRANSURETHRAL RESECTION OF BLADDER TUMOR;  Surgeon: [name], MD;  Location: [facility];  Service: Urology;  Laterality: N/A;   TRANSURETHRAL RESECTION OF PROSTATE N/A 02/26/2013   Procedure: TRANSURETHRAL RESECTION OF THE PROSTATE WITH GYRUS INSTRUMENTS;  Surgeon: Senior Matilda, MD;  Location: [facility];  Service:  Urology;  Laterality: N/A;   TRANSURETHRAL RESECTION OF PROSTATE N/A 06/22/2021   Procedure: TRANSURETHRAL RESECTION OF THE PROSTATE (TURP);  Surgeon: [name], MD;  Location: [facility];  Service: Urology;  Laterality: N/A;   Patient Active Problem List   Diagnosis Date Noted   Bronchitis 06/16/2023   Impacted cerumen of left ear 06/16/2023   Acute encephalopathy 04/18/2023   Parkinson's disease (HCC) 04/18/2023   History of CVA with residual deficit 04/18/2023   Chronic kidney disease, stage 3b (HCC) 04/18/2023   Dementia with behavioral disturbance (HCC) 04/18/2023   Anemia of chronic disease 04/18/2023   Laceration of right upper extremity 02/24/2023   Dermatitis 02/24/2023   Lung nodules 02/24/2023   Dysuria 08/03/2022   Nonintractable headache 07/01/2022   Bilateral impacted cerumen 06/24/2022   Rash 06/15/2022   Postoperative ileus (HCC) 02/27/2022   Ileus, postoperative (HCC) 02/26/2022   Choledocholithiasis 02/19/2022   DNR (do not resuscitate) 02/19/2022   Left elbow pain 01/26/2022   Mid back pain on left side 01/26/2022   Rib pain 01/26/2022   Acute pain of left shoulder 11/12/2021   Leukocytes in urine 11/12/2021   Urinary frequency 11/12/2021   Thrush 10/08/2021   Hemiplegia, 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   Insomnia    Prediabetes    Acute 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 (HCC)    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   Transaminitis 07/27/2021   UTI (urinary tract infection) 07/27/2021   CVA (cerebral vascular accident) (HCC) 07/27/2021   Fall 07/27/2021   Hyperglycemia 07/27/2021   Cholelithiasis 07/27/2021   Hypoxia 07/27/2021   Nausea and vomiting 07/27/2021   Acute metabolic encephalopathy 07/27/2021   Normocytic anemia 07/27/2021   Chronic back pain 07/27/2021   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   Closed fracture of first  lumbar vertebra with routine healing 02/03/2021   Closed fracture of multiple ribs 11/18/2020   Anxiety 01/29/2020   Leg pain, bilateral 01/29/2020   Ingrown toenail 07/13/2019   Lumbar spondylosis 05/02/2018   Pain in left knee 03/09/2018   Osteoarthritis of left hip 01/16/2018   Trochanteric bursitis of left hip 01/16/2018   Preventative health care 09/26/2017   HTN (hypertension) 07/19/2015   Hyperlipidemia 07/19/2015   Great toe pain 02/11/2014   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   Obesity (BMI 30-39.9) 06/25/2013   Renal insufficiency 06/25/2013   Weakness of left arm 06/25/2013   Sebaceous cyst 03/03/2011   Sprain of lumbar region 07/31/2010   Rib pain, left 08/29/2009   Carotid artery stenosis, asymptomatic, bilateral 05/02/2009   Eczema, atopic 05/31/2008   Vitamin D  deficiency 03/01/2008   BPH (benign prostatic hyperplasia) 08/06/2007   Fasting hyperglycemia 12/21/2006   History of right MCA infarct 2006    ONSET DATE: 06/29/22 REFERRING DIAG:  Diagnosis  I63.9 (ICD-10-CM) - Cerebrovascular accident (CVA), unspecified mechanism (HCC)  W19.XXXD (  ICD-10-CM) - Fall, subsequent encounter    THERAPY DIAG:  Muscle weakness (generalized)  Other abnormalities of gait and mobility  Hemiplegia and hemiparesis following cerebral infarction affecting left non-dominant side (HCC)  Difficulty in walking, not elsewhere classified  Repeated falls  Acute pain of left shoulder  History of falling  Other lack of coordination  Rationale for Evaluation and Treatment: Rehabilitation  SUBJECTIVE:                                                                                                                                                                                         SUBJECTIVE STATEMENT:  Patient with the left shoulder elevated today, would not relax even with cues, also would not open left hand to put on the handle of the nustep  PERTINENT  HISTORY:  William [name] is a 83 year old man with dementia, CKD, HTN, CAD, HLD and history of CVA  (2006, 2023), Parkinson's  PAIN:  Are you having pain? Faces/behavior scale- mild pain BLEs  and back   PRECAUTIONS: Fall  WEIGHT BEARING RESTRICTIONS: No  FALLS: Has patient fallen in last 6 months? No  LIVING ENVIRONMENT: Lives with: lives with their family and lives with their spouse Lives in: House/apartment Stairs: 1 brick high step Has following equipment at home: Environmental consultant - 2 wheeled, Wheelchair (manual), Shower bench, bed side commode, Grab bars, and hospital bed, sliding pad for car  PLOF: Independent with basic ADLs and Independent with household mobility without device  PATIENT GOALS: Walk around the block with his wife. Be able to use the dining room chair rather than have to use the W/C. Increased I with bed mobility and simple tasks at home like make some coffee or brush his teeth, Step over tub to shower, has bench, but he can't really use it. Up and down the one step to get to back deck. 11/23/22: Patient's wife Devere updated his goals today: Walk around store (gets distracted), swing a golf club again (thinking of driving range), get up on his own, walk without leaning forward, more recognition of the left side (from CVA), not need gait belt, OT-- be able to get dressed more on his own, navigate the newspaper.  OBJECTIVE:   TREATMENT: 04/05/24 Nustep level 5 x 6 minutes STM to the left upper trap Bike level 4 x 6 minutes Standing reaching for numbers called out up to a sequence of 4 Standing ball bouncing Gait outside around parking [place], rest and then around to the front, then 200 feet inside and out to car  03/27/24 Nustep level 5 x 6 minutes Bike level 5 x 5 minutes STM  with the tgun to the left upper trap area Passive ROM to the left shoulder all motions to his tolerance UBE level 4 x 4 minutes Gait outside with HHA 1/2 the parking [place] and then rest and  then around to the front of the building on the smooth surface Seated left LAQ, marches Standing ball bouncing Standing HHA side stepping and backward walking  03/20/24 Nustep level 5 x 6 minutes Bike level 4 x 5 minutes UBE level 3 x 4 minutes Gait outside 1/2 around the parking island rest and then through grass back to the front of the building some cues for step length Seated partial sit ups, worked on some trunk mobility trying to get him to lean side to side.  Ball bouncing in sitting and in standing.  Side stepping, gait 2 laps with HHA PROM of the left shoulder  03/15/2024 Nustep level 5 x 5 minutes Bike level 4 x 5 minutes Dribbling ball Partial sit ups Ball kicks Walked out side with HHA 1/2 way around parking [place] and then rest and then from there to the front door through some deep grass, this was very difficult for him, needed a lot of cues to take bigger steps and needed more assist, once we did this he had a hard time getting his feet and steps back. Did 240 feet HHA walking in the clinic and then to the car after this to get the stepping pattern back  03/08/24 NuStep L5x  UBE L3 x45mins each way- hard time keeping hand on bar today AB isometric 2x10 Ball squeezes 2x10 Standing ball dribbling Kicking different color cones Dribbling ball back and forth with wife   02/28/24 NuStep L5x67mins UBE L2 x62mins each way  Seated PWR moves- holding big red ball touching floor and reaching overhead, rotations holding big red ball Punches with 2# weights  LAQ 5# 2x12 Modified sit ups 2x10  02/21/24 Nustep level 5 x 9 minutes UBE level 3 x 5 minutes Worked with wife on the lift from floor chair, is this feasible and possible for Sachse to use.  I worked on trying to simulate this, with him having to sit up from supine with him going to long sitting.  Difficult for him due to tight HS, then can he push himself up onto a small seating surface, again very difficulty for him as he  was falling backward Passive HS stretches Seated PWR moves and instructed his wife with these Gait out back door through the grass to the car  02/14/24 Nustep level 5 x 6 minutes Bike level 4 x 5 minutes UBE level 4 x 5 minutes STM to the left shoulder area mainly upper trap Some genlte PROM of the left shoulder Gait with HHA out the back door around one of the islands on the side of the back building back up the hill to the picnic table and rest and then through the grass on the side of the building to the front door, this was very fatiguing for him, a lot of cues verbal and physical to get him to stand up Gait out to the car  PATIENT EDUCATION: Education details: none new 02/16/23 Person educated: Patient and Spouse Education method: Explanation, Demonstration, Tactile cues, and Verbal cues Education comprehension: verbalized understanding, returned demonstration, verbal cues required, tactile cues required, and needs further education   ------------------------------------------------------------------------------------------------------- (Measures in this section from initial evaluation unless otherwise noted) DIAGNOSTIC FINDINGS:  MRI 2021 with degenerative changes in lumbar spine, L3-4  R subarticular stenosis COGNITION: Overall cognitive status: History of cognitive impairments - at baseline SENSATION: Not tested COORDINATION: Moderately impaired BLE MUSCLE LENGTH: Hamstrings: severely restricted B Thomas test: Severely restricted B. POSTURE: rounded shoulders, forward head, increased thoracic kyphosis, posterior pelvic tilt, and flexed trunk  LOWER EXTREMITY ROM:   BLE extremely stiff throughout, limited hip ROM in all planes, B knees limited in extension. LOWER EXTREMITY MMT:  3+/5 throughout. Unable to determine accuracy of MMT due to cognition. Functionally demosntrates poor coordinated activation and poor muscular endurance. BED MOBILITY: Min A for Supine<> Sit. Patient was  using a ladder in his hospital bed and could move MI. TRANSFERS: Min A, mod VC, tends to lean back.  11/02/22 independent with use of hands on arm chair with a couple of tries CURB: Min A-Has one step out to his deck. STAIRS: N/A  GAIT: Gait pattern: step to pattern, decreased arm swing- Right, decreased arm swing- Left, decreased step length- Right, decreased step length- Left, shuffling, festinating, trunk flexed, narrow BOS, poor foot clearance- Right, and poor foot clearance- Left Distance walked: 26' Assistive device utilized: Environmental consultant - 2 wheeled Level of assistance: Occasional min A Comments: mod VC to stay close to walker, take longer steps. FUNCTIONAL TESTS:  5 times sit to stand: 38.23  09/29/22  could not performs 5XSTS, 11/02/22 = 47 seconds with cues to use arms, 07/21/23 really struggled today 55 seconds Timed up and go (TUG): 47.91 with RW, 07/21/22 TUG 29  seconds without device, 09/29/22 25 seconds, 11/02/22 TUG 20 seconds, 07/21/23 = 26 seconds no device SBA                                                                     PATIENT EDUCATION: Education details: Progress towards goals, POC Person educated: Patient and Spouse Education method: Programmer, multimedia, Facilities manager, and Verbal cues  Education comprehension: verbalized understanding, returned demonstration, verbal cues required, and needs further education  HOME EXERCISE PROGRAM: PWR! moves  GOALS:  SHORT TERM GOALS: Target date: 01/25/24   Pt will perform HEP to maintain lower body strength, flexibility, posture with wife's assistance. Baseline:  Goal status: met 04/12/23  2. Pt will perform sit to stand, 8 of 10 reps with BUE support, with supervision, to maintain safety with transfers.  Baseline: min guard  Goal status: 02/07/23-CGA from elevated surface and max VC to lean forward, ongoing  continues to need a lot of cues for hand placement and to lean forward, tends to lean backward, needs cues and some assistance  02/28/24  LONG TERM GOALS: Target date: 04/10/24   Patient will perform HEP progression to maintain lower body strength, flexibility, posture with wife's assistance. Baseline:  Goal status: has been able to walk with wife multiple times a day 03/27/24  2.  Pt will ambulate 150-175 ft in 2 MWT with min guard assist, to preserve functional mobility within the home.  Baseline: Goal status:  met 08/25/23  3. Wife will verbalize tips for fall prevention education to minimize fall frequency Baseline:  Goal status:progressing and doing well 4.  Patient will be able to toilet without freezing for over a 4 week period met 11/15/23 5.  Patient will be able to get out of bed from supine with wife  assist minimal Assist,met 10/06/23   ASSESSMENT:  CLINICAL IMPRESSION:  Patient and wife report ups and downs with good and bad days, he was able to do well walking today, biggest issue I saw was his left shoulder was very elevated up to his ear and he could not get it to relax down.  He seems to be having more difficulty swallowing and with verbalizing. OBJECTIVE IMPAIRMENTS: Abnormal gait, decreased activity tolerance, decreased balance, decreased cognition, decreased coordination, decreased endurance, decreased mobility, difficulty walking, decreased ROM, decreased strength, decreased safety awareness, impaired flexibility, impaired UE functional use, improper body mechanics, and postural dysfunction.   PLAN:  PT FREQUENCY: 1x/week  PT DURATION: 12 weeks  PLANNED INTERVENTIONS: Therapeutic exercises, Therapeutic activity, Neuromuscular re-education, Balance training, Gait training, Patient/Family education, Self Care, Joint mobilization, Stair training, Moist heat, and Manual therapy  PLAN FOR NEXT SESSION:   Will continue to work with patient to help him and his wife stay living at home and with less difficulty   Patient Details  Name: William [name] MRN: [id number] Date of Birth:  06-29-1940 Referring Provider:  Antonio Meth, Jamee SAUNDERS, *

## 2024-04-10 ENCOUNTER — Other Ambulatory Visit: Payer: Self-pay

## 2024-04-10 ENCOUNTER — Ambulatory Visit (HOSPITAL_COMMUNITY): Admitting: Psychiatry

## 2024-04-10 ENCOUNTER — Other Ambulatory Visit (HOSPITAL_COMMUNITY): Payer: Self-pay

## 2024-04-10 ENCOUNTER — Other Ambulatory Visit (HOSPITAL_BASED_OUTPATIENT_CLINIC_OR_DEPARTMENT_OTHER): Payer: Self-pay

## 2024-04-10 ENCOUNTER — Encounter (HOSPITAL_COMMUNITY): Payer: Self-pay | Admitting: Psychiatry

## 2024-04-10 VITALS — BP 134/72 | HR 64 | Ht 71.0 in | Wt 163.0 lb

## 2024-04-10 DIAGNOSIS — F03918 Unspecified dementia, unspecified severity, with other behavioral disturbance: Secondary | ICD-10-CM | POA: Diagnosis not present

## 2024-04-10 MED ORDER — CARBAMAZEPINE 100 MG/5ML PO SUSP
ORAL | 5 refills | Status: AC
  Filled 2024-04-10: qty 600, 30d supply, fill #0
  Filled 2024-05-07: qty 600, 30d supply, fill #1
  Filled 2024-06-01: qty 600, 30d supply, fill #2
  Filled 2024-07-06: qty 600, 30d supply, fill #3

## 2024-04-10 MED ORDER — BREXPIPRAZOLE 1 MG PO TABS
ORAL_TABLET | ORAL | 5 refills | Status: DC
  Filled 2024-04-10: qty 53, 30d supply, fill #0
  Filled 2024-04-13: qty 60, 30d supply, fill #0

## 2024-04-10 MED ORDER — REXULTI 0.5 MG PO TABS: ORAL_TABLET | ORAL | Status: AC

## 2024-04-10 NOTE — Progress Notes (Signed)
       Today's date is 04/10/2024.  Patient is seen with his wife Devere.  The patient is not doing all that well.  He is wheelchair-bound.  He needs assistance going to the bathroom and some assistance dressing and eating.  Patient been demonstrating some sundowning and that he gets agitated towards the end of the day.  He seems to see things and he gets verbally aggressive.  He tends to grab his wife.  He is uncooperative.  He seems to be agitated more so towards the end of the day.  Is difficult to manage.  Wife is doing an excellent job trying to take care of him.  He goes to [employer] a few days a week and she has a daycare people coming to help.  Fortunately [facility] is [facility] for most of his assistance.  Generally the patient is compliant.  But is very evident that has become more agitated towards the end of the day.     Assessment/plan  At this time the patient will continue taking Tegretol  as prescribed.  He will also continue on trazodone  and Ativan  as prescribed.  He will continue going to [employer].  Today already she will be to add [name] 0.5 mg for a week then 1 mg for 2 weeks and then increase it to 1 mg 2 pills every evening.  Will get him up to 2 mg which is the effective dose.  Patient return to see me in 7 weeks.  My hope the patient has less sundowning behaviors is less agitated and more cooperative.   This is the assessment and plan for 11/08/2023.  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  The patient's dementing process is very slow and very stable.  He is functioning very well.  He continues taking Tegretol  which reduce his irritability.  He also takes Ativan  0.5 mg in the morning midday and 1 mg at night.  He takes Desyrel  for sleep which works well.  He is sleeping and eating well and showing no behavioral disturbances.  He will return to see me in 5 months.   Today's date is 03/29/2023.  The patient is diagnosed with  Parkinson's dementia.  He is also diagnosed with an adjustment disorder with anxious mood state.  This is a state that requires him to be on a low dose of Ativan  that he has been taking now for years.  It is very helpful for sleep.  He also takes a little bit in the morning and some in the afternoon which helps his agitation.  Says both for helping him sleep but also having an antiagitation affect.  The Ativan  together with his Tegretol  have been very effective.  For sleep he also takes some trazodone .

## 2024-04-11 ENCOUNTER — Other Ambulatory Visit (HOSPITAL_BASED_OUTPATIENT_CLINIC_OR_DEPARTMENT_OTHER): Payer: Self-pay

## 2024-04-11 ENCOUNTER — Telehealth (HOSPITAL_COMMUNITY): Payer: Self-pay | Admitting: *Deleted

## 2024-04-11 ENCOUNTER — Other Ambulatory Visit: Payer: Self-pay

## 2024-04-11 ENCOUNTER — Ambulatory Visit

## 2024-04-11 DIAGNOSIS — R262 Difficulty in walking, not elsewhere classified: Secondary | ICD-10-CM

## 2024-04-11 DIAGNOSIS — R296 Repeated falls: Secondary | ICD-10-CM

## 2024-04-11 DIAGNOSIS — R41840 Attention and concentration deficit: Secondary | ICD-10-CM

## 2024-04-11 DIAGNOSIS — I6523 Occlusion and stenosis of bilateral carotid arteries: Secondary | ICD-10-CM

## 2024-04-11 DIAGNOSIS — M6281 Muscle weakness (generalized): Secondary | ICD-10-CM | POA: Diagnosis not present

## 2024-04-11 DIAGNOSIS — R41841 Cognitive communication deficit: Secondary | ICD-10-CM

## 2024-04-11 DIAGNOSIS — R2689 Other abnormalities of gait and mobility: Secondary | ICD-10-CM

## 2024-04-11 DIAGNOSIS — R278 Other lack of coordination: Secondary | ICD-10-CM

## 2024-04-11 DIAGNOSIS — M25512 Pain in left shoulder: Secondary | ICD-10-CM

## 2024-04-11 DIAGNOSIS — I69354 Hemiplegia and hemiparesis following cerebral infarction affecting left non-dominant side: Secondary | ICD-10-CM

## 2024-04-11 DIAGNOSIS — Z9181 History of falling: Secondary | ICD-10-CM

## 2024-04-11 NOTE — Therapy (Signed)
 OUTPATIENT PHYSICAL THERAPY NEURO TREATMENT & PROGRESS REPORT    Patient Name: William [name] MRN: [id number] DOB:1941-03-25, 83 y.o., male Today's Date: 04/11/2024  PCP: [name]  DO REFERRING PROVIDER: same   END OF SESSION:  PT End of Session - 04/11/24 1525     Visit Number 98    Date for Recertification  06/10/24    PT Start Time 1527    PT Stop Time 1605    PT Time Calculation (min) 38 min    Equipment Utilized During Treatment Gait belt    Activity Tolerance Patient tolerated treatment well    Behavior During Therapy WFL for tasks assessed/performed                  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   HTN (hypertension) 07/19/2015   Hyperlipidemia    Kidney disease, chronic, stage III (GFR 30-59 ml/min) (HCC)    Major neurocognitive disorder 01/09/2014   Mild, related to stroke history   Nocturia    Parkinson disease (HCC)    Renal insufficiency 06/25/2013   S/P carotid endarterectomy    BILATERAL ICA--  PATENT PER DUPLEX  05-19-2012   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/26/2017   Right calf   Urinary frequency    Vitamin D  deficiency    Past Surgical History:  Procedure Laterality Date   APPENDECTOMY  AS CHILD   CARDIOVASCULAR STRESS TEST  03-27-2012  DR CRENSHAW   LOW RISK LEXISCAN  STUDY-- PROBABLE NORMAL PERFUSION AND SOFT TISSUE ATTENUATION/  NO ISCHEMIA/ EF 51%   CAROTID ENDARTERECTOMY Bilateral LEFT  11-12-2008  DR GREG HAYES   RIGHT ICA  2006  ([facility])   CHOLECYSTECTOMY N/A 02/23/2022   Procedure:  LAPAROSCOPIC CHOLECYSTECTOMY;  Surgeon: [name], MD;  Location: [facility];  Service: General;  Laterality: N/A;   CYSTOSCOPY W/ RETROGRADES Bilateral 06/22/2021   Procedure: CYSTOSCOPY WITH RETROGRADE PYELOGRAM;  Surgeon: [name], MD;  Location: [facility];  Service: Urology;  Laterality: Bilateral;   CYSTOSCOPY WITH LITHOLAPAXY N/A 02/26/2013   Procedure: CYSTOSCOPY WITH LITHOLAPAXY;  Surgeon: Senior Matilda, MD;  Location: [facility];  Service: Urology;  Laterality: N/A;   ENDOSCOPIC RETROGRADE CHOLANGIOPANCREATOGRAPHY (ERCP) WITH PROPOFOL  N/A 02/22/2022   Procedure: ENDOSCOPIC RETROGRADE CHOLANGIOPANCREATOGRAPHY (ERCP) WITH PROPOFOL ;  Surgeon: [name], MD;  Location: [facility] ENDOSCOPY;  Service: Gastroenterology;  Laterality: N/A;   EYE SURGERY  Jan. 2016   cataract surgery both eyes   INGUINAL HERNIA REPAIR Right 11-08-2006   IR KYPHO EA ADDL LEVEL THORACIC OR LUMBAR  02/12/2021   IR RADIOLOGIST EVAL & MGMT  02/18/2021   MASS EXCISION N/A 03/03/2016   Procedure: EXCISION OF BACK  MASS;  Surgeon: [name], MD;  Location: [facility];  Service: General;  Laterality: N/A;   MOHS SURGERY Left 1/ 2016   [name]-- Basal cell   PROSTATE SURGERY     REMOVAL OF STONES  02/22/2022   Procedure: REMOVAL OF  STONES;  Surgeon: [name], MD;  Location: [facility] ENDOSCOPY;  Service: Gastroenterology;;   [name]  02/22/2022   Procedure: [name];  Surgeon: [name], MD;  Location: [facility] END[facility];  Service: Gastroenterology;;   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 N/A 06/22/2021   Procedure: TRANSURETHRAL RESECTION OF BLADDER TUMOR;  Surgeon: [name], MD;  Location: [facility];  Service: Urology;  Laterality: N/A;   TRANSURETHRAL RESECTION OF PROSTATE N/A 02/26/2013   Procedure: TRANSURETHRAL RESECTION OF THE PROSTATE WITH GYRUS INSTRUMENTS;  Surgeon: Senior Matilda, MD;  Location:  [facility];  Service: Urology;  Laterality: N/A;   TRANSURETHRAL RESECTION OF PROSTATE N/A 06/22/2021   Procedure: TRANSURETHRAL RESECTION OF THE PROSTATE (TURP);  Surgeon: [name], MD;  Location: [facility];  Service: Urology;  Laterality: N/A;   Patient Active Problem List   Diagnosis Date Noted   Bronchitis 06/16/2023   Impacted cerumen of left ear 06/16/2023   Acute encephalopathy 04/18/2023   Parkinson's disease (HCC) 04/18/2023   History of CVA with residual deficit 04/18/2023   Chronic kidney disease, stage 3b (HCC) 04/18/2023   Dementia with behavioral disturbance (HCC) 04/18/2023   Anemia of chronic disease 04/18/2023   Laceration of right upper extremity 02/24/2023   Dermatitis 02/24/2023   Lung nodules 02/24/2023   Dysuria 08/03/2022   Nonintractable headache 07/01/2022   Bilateral impacted cerumen 06/24/2022   Rash 06/15/2022   Postoperative ileus (HCC) 02/27/2022   Ileus, postoperative (HCC) 02/26/2022   Choledocholithiasis 02/19/2022   DNR (do not resuscitate) 02/19/2022   Left elbow pain 01/26/2022   Mid back pain on left side 01/26/2022   Rib pain 01/26/2022   Acute pain of left shoulder 11/12/2021   Leukocytes in urine 11/12/2021   Urinary frequency 11/12/2021   Thrush 10/08/2021   Hemiplegia, 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   Insomnia    Prediabetes    Acute 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 (HCC)    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   Transaminitis 07/27/2021   UTI (urinary tract infection) 07/27/2021   CVA (cerebral vascular accident) (HCC) 07/27/2021   Fall 07/27/2021   Hyperglycemia 07/27/2021   Cholelithiasis 07/27/2021   Hypoxia 07/27/2021   Nausea and vomiting 07/27/2021   Acute metabolic encephalopathy 07/27/2021   Normocytic anemia 07/27/2021   Chronic back pain 07/27/2021   Malignant neoplasm of overlapping sites of bladder (HCC)  06/22/2021   Closed fracture of first lumbar vertebra with routine healing 02/03/2021   Closed fracture of multiple ribs 11/18/2020   Anxiety 01/29/2020   Leg pain, bilateral 01/29/2020   Ingrown toenail 07/13/2019   Lumbar spondylosis 05/02/2018   Pain in left knee 03/09/2018   Osteoarthritis of left hip 01/16/2018   Trochanteric bursitis of left hip 01/16/2018   Preventative health care 09/26/2017   HTN (hypertension) 07/19/2015   Hyperlipidemia 07/19/2015   Great toe pain 02/11/2014   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   Obesity (BMI 30-39.9) 06/25/2013   Renal insufficiency 06/25/2013   Weakness of left arm 06/25/2013   Sebaceous cyst 03/03/2011   Sprain of lumbar region 07/31/2010   Rib pain, left 08/29/2009   Carotid artery stenosis, asymptomatic, bilateral 05/02/2009   Eczema, atopic 05/31/2008   Vitamin D  deficiency 03/01/2008   BPH (benign prostatic hyperplasia) 08/06/2007   Fasting hyperglycemia 12/21/2006   History of right MCA infarct 2006    ONSET DATE: 06/29/22 REFERRING DIAG:  Diagnosis  I63.9 (ICD-10-CM) - Cerebrovascular  accident (CVA), unspecified mechanism (HCC)  W19.XXXD (ICD-10-CM) - Fall, subsequent encounter    THERAPY DIAG:  Muscle weakness (generalized)  Other abnormalities of gait and mobility  Hemiplegia and hemiparesis following cerebral infarction affecting left non-dominant side (HCC)  Difficulty in walking, not elsewhere classified  Repeated falls  Acute pain of left shoulder  Cognitive communication deficit  History of falling  Other lack of coordination  Attention and concentration deficit  Carotid artery stenosis, asymptomatic, bilateral  Rationale for Evaluation and Treatment: Rehabilitation  SUBJECTIVE:                                                                                                                                                                                         SUBJECTIVE STATEMENT:   Patient reports doing well. Demos L shoulder hike, but was able to reach and perform UBE bike  PERTINENT HISTORY:  CRISTOVAL TEALL is a 83 year old man with dementia, CKD, HTN, CAD, HLD and history of CVA  (2006, 2023), Parkinson's  PAIN:  Are you having pain? Faces/behavior scale- mild pain BLEs  and back   PRECAUTIONS: Fall  WEIGHT BEARING RESTRICTIONS: No  FALLS: Has patient fallen in last 6 months? No  LIVING ENVIRONMENT: Lives with: lives with their family and lives with their spouse Lives in: House/apartment Stairs: 1 brick high step Has following equipment at home: Environmental Consultant - 2 wheeled, Wheelchair (manual), Shower bench, bed side commode, Grab bars, and hospital bed, sliding pad for car  PLOF: Independent with basic ADLs and Independent with household mobility without device  PATIENT GOALS: Walk around the block with his wife. Be able to use the dining room chair rather than have to use the W/C. Increased I with bed mobility and simple tasks at home like make some coffee or brush his teeth, Step over tub to shower, has bench, but he can't really use it. Up and down the one step to get to back deck. 11/23/22: Patient's wife Devere updated his goals today: Walk around store (gets distracted), swing a golf club again (thinking of driving range), get up on his own, walk without leaning forward, more recognition of the left side (from CVA), not need gait belt, OT-- be able to get dressed more on his own, navigate the newspaper.  OBJECTIVE:   TREATMENT: 04/10/24 Nustep lvl 5 for 6 min LE SciFit lvl 4 for 5 min UBE Bike lvl 3 2 min fwd, 2 min bkwd Ambulating around the clinic to find cones for cardiovascular endurance, functional mobility, and visual scanning.  Car and toilet transfers  04/05/24 Nustep level 5 x 6 minutes STM to the left upper  trap Bike level 4 x 6 minutes Standing reaching for numbers called out up to a sequence of 4 Standing ball bouncing Gait outside around  parking [place], rest and then around to the front, then 200 feet inside and out to car  03/27/24 Nustep level 5 x 6 minutes Bike level 5 x 5 minutes STM with the tgun to the left upper trap area Passive ROM to the left shoulder all motions to his tolerance UBE level 4 x 4 minutes Gait outside with HHA 1/2 the parking [place] and then rest and then around to the front of the building on the smooth surface Seated left LAQ, marches Standing ball bouncing Standing HHA side stepping and backward walking  03/20/24 Nustep level 5 x 6 minutes Bike level 4 x 5 minutes UBE level 3 x 4 minutes Gait outside 1/2 around the parking island rest and then through grass back to the front of the building some cues for step length Seated partial sit ups, worked on some trunk mobility trying to get him to lean side to side.  Ball bouncing in sitting and in standing.  Side stepping, gait 2 laps with HHA PROM of the left shoulder  03/15/2024 Nustep level 5 x 5 minutes Bike level 4 x 5 minutes Dribbling ball Partial sit ups Ball kicks Walked out side with HHA 1/2 way around parking [place] and then rest and then from there to the front door through some deep grass, this was very difficult for him, needed a lot of cues to take bigger steps and needed more assist, once we did this he had a hard time getting his feet and steps back. Did 240 feet HHA walking in the clinic and then to the car after this to get the stepping pattern back  03/08/24 NuStep L5x  UBE L3 x83mins each way- hard time keeping hand on bar today AB isometric 2x10 Ball squeezes 2x10 Standing ball dribbling Kicking different color cones Dribbling ball back and forth with wife   02/28/24 NuStep L5x107mins UBE L2 x32mins each way  Seated PWR moves- holding big red ball touching floor and reaching overhead, rotations holding big red ball Punches with 2# weights  LAQ 5# 2x12 Modified sit ups 2x10  02/21/24 Nustep level 5 x 9 minutes UBE  level 3 x 5 minutes Worked with wife on the lift from floor chair, is this feasible and possible for [place] to use.  I worked on trying to simulate this, with him having to sit up from supine with him going to long sitting.  Difficult for him due to tight HS, then can he push himself up onto a small seating surface, again very difficulty for him as he was falling backward Passive HS stretches Seated PWR moves and instructed his wife with these Gait out back door through the grass to the car  02/14/24 Nustep level 5 x 6 minutes Bike level 4 x 5 minutes UBE level 4 x 5 minutes STM to the left shoulder area mainly upper trap Some genlte PROM of the left shoulder Gait with HHA out the back door around one of the islands on the side of the back building back up the hill to the picnic table and rest and then through the grass on the side of the building to the front door, this was very fatiguing for him, a lot of cues verbal and physical to get him to stand up Gait out to the car  PATIENT EDUCATION: Education details: none  new 02/16/23 Person educated: Patient and Spouse Education method: Explanation, Demonstration, Tactile cues, and Verbal cues Education comprehension: verbalized understanding, returned demonstration, verbal cues required, tactile cues required, and needs further education   ------------------------------------------------------------------------------------------------------- (Measures in this section from initial evaluation unless otherwise noted) DIAGNOSTIC FINDINGS:  MRI 2021 with degenerative changes in lumbar spine, L3-4 R subarticular stenosis COGNITION: Overall cognitive status: History of cognitive impairments - at baseline SENSATION: Not tested COORDINATION: Moderately impaired BLE MUSCLE LENGTH: Hamstrings: severely restricted B Thomas test: Severely restricted B. POSTURE: rounded shoulders, forward head, increased thoracic kyphosis, posterior pelvic tilt, and flexed  trunk  LOWER EXTREMITY ROM:   BLE extremely stiff throughout, limited hip ROM in all planes, B knees limited in extension. LOWER EXTREMITY MMT:  3+/5 throughout. Unable to determine accuracy of MMT due to cognition. Functionally demosntrates poor coordinated activation and poor muscular endurance. BED MOBILITY: Min A for Supine<> Sit. Patient was using a ladder in his hospital bed and could move MI. TRANSFERS: Min A, mod VC, tends to lean back.  11/02/22 independent with use of hands on arm chair with a couple of tries CURB: Min A-Has one step out to his deck. STAIRS: N/A  GAIT: Gait pattern: step to pattern, decreased arm swing- Right, decreased arm swing- Left, decreased step length- Right, decreased step length- Left, shuffling, festinating, trunk flexed, narrow BOS, poor foot clearance- Right, and poor foot clearance- Left Distance walked: 54' Assistive device utilized: Environmental Consultant - 2 wheeled Level of assistance: Occasional min A Comments: mod VC to stay close to walker, take longer steps. FUNCTIONAL TESTS:  5 times sit to stand: 38.23  09/29/22  could not performs 5XSTS, 11/02/22 = 47 seconds with cues to use arms, 07/21/23 really struggled today 55 seconds Timed up and go (TUG): 47.91 with RW, 07/21/22 TUG 29  seconds without device, 09/29/22 25 seconds, 11/02/22 TUG 20 seconds, 07/21/23 = 26 seconds no device SBA                                                                     PATIENT EDUCATION: Education details: Progress towards goals, POC Person educated: Patient and Spouse Education method: Programmer, Multimedia, Facilities Manager, and Verbal cues  Education comprehension: verbalized understanding, returned demonstration, verbal cues required, and needs further education  HOME EXERCISE PROGRAM: PWR! moves  GOALS:  SHORT TERM GOALS: Target date: 01/25/24   Pt will perform HEP to maintain lower body strength, flexibility, posture with wife's assistance. Baseline:  Goal status: met 04/12/23  2. Pt  will perform sit to stand, 8 of 10 reps with BUE support, with supervision, to maintain safety with transfers.  Baseline: min guard  Goal status: 02/07/23-CGA from elevated surface and max VC to lean forward, ongoing  continues to need a lot of cues for hand placement and to lean forward, tends to lean backward, needs cues and some assistance 02/28/24; 04/11/24 - requires CGA-MinA with Sit>Stand  LONG TERM GOALS: Target date: 04/10/24   Patient will perform HEP progression to maintain lower body strength, flexibility, posture with wife's assistance. Baseline:  Goal status: has been able to walk with wife multiple times a day 03/27/24; pt states compliance to HEP and walking to and from the mailbox w wife every day 04/11/24  2.  Pt will ambulate 150-175 ft in 2 MWT with min guard assist, to preserve functional mobility within the home.  Baseline: Goal status:  met 08/25/23  3. Wife will verbalize tips for fall prevention education to minimize fall frequency Baseline:  Goal status:progressing and doing well 4.  Patient will be able to toilet without freezing for over a 4 week period met 11/15/23  5.  Patient will be able to get out of bed from supine with wife assist minimal Assist,met 10/06/23   ASSESSMENT:  CLINICAL IMPRESSION:  Patient reports doing well. Able to ambulate longer distances when searching for the cones, but was interrupted with a bathroom break. Demos mild inattention to the L side d/t previous CVA potentially. During scavenger hunt, demos mild difficulty with dynamic balance and finding objects against the same color and required max cues. CGA-MinA with balance. He seems to be having more difficulty swallowing and with verbalizing.  OBJECTIVE IMPAIRMENTS: Abnormal gait, decreased activity tolerance, decreased balance, decreased cognition, decreased coordination, decreased endurance, decreased mobility, difficulty walking, decreased ROM, decreased strength, decreased safety  awareness, impaired flexibility, impaired UE functional use, improper body mechanics, and postural dysfunction.   PLAN:  PT FREQUENCY: 1x/week  PT DURATION: 12 weeks  PLANNED INTERVENTIONS: Therapeutic exercises, Therapeutic activity, Neuromuscular re-education, Balance training, Gait training, Patient/Family education, Self Care, Joint mobilization, Stair training, Moist heat, and Manual therapy  PLAN FOR NEXT SESSION:   Will continue to work with patient to help him and his wife stay living at home and with less difficulty   Patient Details  Name: DELAND SLOCUMB MRN: [id number] Date of Birth: [birth year] Referring Provider:  [name], *  Montie Gelardi, PT, DPT 04/11/24

## 2024-04-11 NOTE — Telephone Encounter (Signed)
 PA for [name] 0.5 mg tablets submitted to [employer] and has been APPROVED through 06/13/24.

## 2024-04-12 ENCOUNTER — Other Ambulatory Visit (HOSPITAL_BASED_OUTPATIENT_CLINIC_OR_DEPARTMENT_OTHER): Payer: Self-pay

## 2024-04-13 ENCOUNTER — Other Ambulatory Visit (HOSPITAL_COMMUNITY): Payer: Self-pay | Admitting: *Deleted

## 2024-04-13 ENCOUNTER — Telehealth (HOSPITAL_COMMUNITY): Payer: Self-pay | Admitting: *Deleted

## 2024-04-13 ENCOUNTER — Other Ambulatory Visit (HOSPITAL_BASED_OUTPATIENT_CLINIC_OR_DEPARTMENT_OTHER): Payer: Self-pay

## 2024-04-13 MED ORDER — BREXPIPRAZOLE 2 MG PO TABS
2.0000 mg | ORAL_TABLET | Freq: Every day | ORAL | 0 refills | Status: AC
  Filled 2024-04-13 – 2024-04-17 (×3): qty 30, 30d supply, fill #0

## 2024-04-13 NOTE — Telephone Encounter (Signed)
 Pt's wife, William [name], called to advise that [name] script co pay cost $750 for 30 day supply, and asked for less expensive alternatives. Writer [name] William [name] that she had done a PA and it was approved however there are quantity limits with Medicare per pt pharmacy. We have sent in a prescription for [name] 2 mg with instructions to use half for 7 days, then increase to whole tab at bedtime. Spouse was [name] that we can provide samples and they do already have some however she is wondering wether or not to continue as prescription is likely to be too expensive. William [name] to continue samples as scheduled at least until we see if Medicare will approve the new script and what the co pay will be. William [name] verbalizes understanding and agrees.

## 2024-04-14 ENCOUNTER — Other Ambulatory Visit: Payer: Self-pay | Admitting: Neurology

## 2024-04-16 ENCOUNTER — Other Ambulatory Visit (HOSPITAL_BASED_OUTPATIENT_CLINIC_OR_DEPARTMENT_OTHER): Payer: Self-pay

## 2024-04-16 ENCOUNTER — Other Ambulatory Visit: Payer: Self-pay

## 2024-04-16 MED ORDER — CARBIDOPA-LEVODOPA 25-100 MG PO TABS
1.5000 | ORAL_TABLET | Freq: Three times a day (TID) | ORAL | 5 refills | Status: DC
  Filled 2024-04-16: qty 165, 37d supply, fill #0
  Filled 2024-05-18: qty 165, 37d supply, fill #1

## 2024-04-17 ENCOUNTER — Other Ambulatory Visit (HOSPITAL_BASED_OUTPATIENT_CLINIC_OR_DEPARTMENT_OTHER): Payer: Self-pay

## 2024-04-17 ENCOUNTER — Encounter: Payer: Self-pay | Admitting: Physical Therapy

## 2024-04-17 ENCOUNTER — Ambulatory Visit: Attending: Family Medicine | Admitting: Physical Therapy

## 2024-04-17 DIAGNOSIS — I69354 Hemiplegia and hemiparesis following cerebral infarction affecting left non-dominant side: Secondary | ICD-10-CM | POA: Diagnosis not present

## 2024-04-17 DIAGNOSIS — R2689 Other abnormalities of gait and mobility: Secondary | ICD-10-CM | POA: Insufficient documentation

## 2024-04-17 DIAGNOSIS — M6281 Muscle weakness (generalized): Secondary | ICD-10-CM | POA: Diagnosis not present

## 2024-04-17 DIAGNOSIS — M25512 Pain in left shoulder: Secondary | ICD-10-CM | POA: Diagnosis not present

## 2024-04-17 DIAGNOSIS — Z9181 History of falling: Secondary | ICD-10-CM | POA: Diagnosis not present

## 2024-04-17 DIAGNOSIS — R262 Difficulty in walking, not elsewhere classified: Secondary | ICD-10-CM | POA: Insufficient documentation

## 2024-04-17 DIAGNOSIS — R296 Repeated falls: Secondary | ICD-10-CM | POA: Insufficient documentation

## 2024-04-17 NOTE — Therapy (Signed)
 OUTPATIENT PHYSICAL THERAPY NEURO TREATMENT & PROGRESS REPORT    Patient Name: William [name] MRN: [id number] DOB:[birth year], 83 y.o., male Today's Date: 04/17/2024  PCP: William Cyndee [name]  DO REFERRING PROVIDER: same   END OF SESSION:  PT End of Session - 04/17/24 1530     Visit Number 99    Date for Recertification  06/10/24    Authorization Type HTA    PT Start Time 1530    PT Stop Time 1615    PT Time Calculation (min) 45 min    Equipment Utilized During Treatment Gait belt    Activity Tolerance Patient tolerated treatment well    Behavior During Therapy WFL for tasks assessed/performed                  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   HTN (hypertension) 07/19/2015   Hyperlipidemia    Kidney disease, chronic, stage III (GFR 30-59 ml/min) (HCC)    Major neurocognitive disorder 01/09/2014   Mild, related to stroke history   Nocturia    Parkinson disease (HCC)    Renal insufficiency 06/25/2013   S/P carotid endarterectomy    BILATERAL ICA--  PATENT PER DUPLEX  05-19-2012   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/26/2017   Right calf   Urinary frequency    Vitamin D  deficiency    Past Surgical History:  Procedure Laterality Date   APPENDECTOMY  AS CHILD   CARDIOVASCULAR STRESS TEST  03-27-2012  DR CRENSHAW   LOW RISK LEXISCAN  STUDY-- PROBABLE NORMAL PERFUSION AND SOFT TISSUE ATTENUATION/  NO ISCHEMIA/ EF 51%   CAROTID ENDARTERECTOMY Bilateral LEFT  11-12-2008  DR GREG HAYES   RIGHT ICA  2006  ([facility])   CHOLECYSTECTOMY N/A  02/23/2022   Procedure: LAPAROSCOPIC CHOLECYSTECTOMY;  Surgeon: William Deward JINNY, MD;  Location: [facility];  Service: General;  Laterality: N/A;   CYSTOSCOPY W/ RETROGRADES Bilateral 06/22/2021   Procedure: CYSTOSCOPY WITH RETROGRADE PYELOGRAM;  Surgeon: William Senior, MD;  Location: [facility];  Service: Urology;  Laterality: Bilateral;   CYSTOSCOPY WITH LITHOLAPAXY N/A 02/26/2013   Procedure: CYSTOSCOPY WITH LITHOLAPAXY;  Surgeon: [name] Matilda, MD;  Location: [facility];  Service: Urology;  Laterality: N/A;   ENDOSCOPIC RETROGRADE CHOLANGIOPANCREATOGRAPHY (ERCP) WITH PROPOFOL  N/A 02/22/2022   Procedure: ENDOSCOPIC RETROGRADE CHOLANGIOPANCREATOGRAPHY (ERCP) WITH PROPOFOL ;  Surgeon: William Dover, MD;  Location: [facility] ENDOSCOPY;  Service: Gastroenterology;  Laterality: N/A;   EYE SURGERY  Jan. 2016   cataract surgery both eyes   INGUINAL HERNIA REPAIR Right 11-08-2006   IR KYPHO EA ADDL LEVEL THORACIC OR LUMBAR  02/12/2021   IR RADIOLOGIST EVAL & MGMT  02/18/2021   MASS EXCISION N/A 03/03/2016   Procedure: EXCISION OF BACK  MASS;  Surgeon: William Nephew, MD;  Location: [facility];  Service: General;  Laterality: N/A;   MOHS SURGERY Left 1/ 2016   [name]-- Basal cell   PROSTATE SURGERY     REMOVAL OF STONES  02/22/2022   Procedure: REMOVAL OF STONES;  Surgeon: William Dover, MD;  Location: [facility] ENDOSCOPY;  Service: Gastroenterology;;   [name]  02/22/2022   Procedure: [name];  Surgeon: William Dover, MD;  Location: [facility] ENDOSCOPY;  Service: Gastroenterology;;   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 N/A 06/22/2021   Procedure: TRANSURETHRAL RESECTION OF BLADDER TUMOR;  Surgeon: William Senior, MD;  Location: [facility];  Service: Urology;  Laterality: N/A;   TRANSURETHRAL RESECTION OF PROSTATE N/A 02/26/2013   Procedure: TRANSURETHRAL RESECTION OF THE PROSTATE WITH GYRUS INSTRUMENTS;  Surgeon: [name] Matilda, MD;  Location: [facility];  Service: Urology;  Laterality: N/A;   TRANSURETHRAL RESECTION OF PROSTATE N/A 06/22/2021   Procedure: TRANSURETHRAL RESECTION OF THE PROSTATE (TURP);  Surgeon: William Senior, MD;  Location: [facility];  Service: Urology;  Laterality: N/A;   Patient Active Problem List   Diagnosis Date Noted   Bronchitis 06/16/2023   Impacted cerumen of left ear 06/16/2023   Acute encephalopathy 04/18/2023   Parkinson's disease (HCC) 04/18/2023   History of CVA with residual deficit 04/18/2023   Chronic kidney disease, stage 3b (HCC) 04/18/2023   Dementia with behavioral disturbance (HCC) 04/18/2023   Anemia of chronic disease 04/18/2023   Laceration of right upper extremity 02/24/2023   Dermatitis 02/24/2023   Lung nodules 02/24/2023   Dysuria 08/03/2022   Nonintractable headache 07/01/2022   Bilateral impacted cerumen 06/24/2022   Rash 06/15/2022   Postoperative ileus (HCC) 02/27/2022   Ileus, postoperative (HCC) 02/26/2022   Choledocholithiasis 02/19/2022   DNR (do not resuscitate) 02/19/2022   Left elbow pain 01/26/2022   Mid back pain on left side 01/26/2022   Rib pain 01/26/2022   Acute pain of left shoulder 11/12/2021   Leukocytes in urine 11/12/2021   Urinary frequency 11/12/2021   Thrush 10/08/2021   Hemiplegia, 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   Insomnia    Prediabetes    Acute 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 (HCC)    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   Transaminitis 07/27/2021   UTI (urinary tract infection) 07/27/2021   CVA (cerebral vascular accident) (HCC) 07/27/2021   Fall 07/27/2021   Hyperglycemia 07/27/2021   Cholelithiasis 07/27/2021   Hypoxia 07/27/2021   Nausea and vomiting 07/27/2021   Acute metabolic encephalopathy 07/27/2021   Normocytic anemia 07/27/2021   Chronic back pain 07/27/2021   Malignant neoplasm of overlapping  sites of bladder (HCC) 06/22/2021   Closed fracture of first lumbar vertebra with routine healing 02/03/2021   Closed fracture of multiple ribs 11/18/2020   Anxiety 01/29/2020   Leg pain, bilateral 01/29/2020   Ingrown toenail 07/13/2019   Lumbar spondylosis 05/02/2018   Pain in left knee 03/09/2018   Osteoarthritis of left hip 01/16/2018   Trochanteric bursitis of left hip 01/16/2018   Preventative health care 09/26/2017   HTN (hypertension) 07/19/2015   Hyperlipidemia 07/19/2015   Great toe pain 02/11/2014   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   Obesity (BMI 30-39.9) 06/25/2013   Renal insufficiency 06/25/2013   Weakness of left arm 06/25/2013   Sebaceous cyst 03/03/2011   Sprain of lumbar region 07/31/2010   Rib pain, left 08/29/2009   Carotid artery stenosis, asymptomatic, bilateral 05/02/2009   Eczema, atopic 05/31/2008   Vitamin D  deficiency 03/01/2008   BPH (benign prostatic hyperplasia) 08/06/2007   Fasting hyperglycemia 12/21/2006   History of right MCA infarct 2006    ONSET DATE: 06/29/22 REFERRING DIAG:  Diagnosis  I63.9 (ICD-10-CM) - Cerebrovascular accident (CVA), unspecified mechanism (HCC)  W19.XXXD (ICD-10-CM) - Fall, subsequent encounter    THERAPY DIAG:  Muscle weakness (generalized)  Other abnormalities of gait and mobility  Hemiplegia and hemiparesis following cerebral infarction affecting left non-dominant side (HCC)  Difficulty in walking, not elsewhere classified  Repeated falls  Acute pain of left shoulder  Rationale for Evaluation and Treatment: Rehabilitation  SUBJECTIVE:                                                                                                                                                                                         SUBJECTIVE STATEMENT:  Spouse reports that he may start a new medication for sundowners.  Reports that she has continued to walk him most days to the mailbox, but sometimes  does not feel safe  PERTINENT HISTORY:  William [name] is a 83 year old man with dementia, CKD, HTN, CAD, HLD and history of CVA  (2006, 2023), Parkinson's  PAIN:  Are you having pain? Faces/behavior scale- mild pain BLEs  and back   PRECAUTIONS: Fall  WEIGHT BEARING RESTRICTIONS: No  FALLS: Has patient fallen in last 6 months? No  LIVING ENVIRONMENT: Lives with: lives with their family and lives with their spouse Lives in: House/apartment Stairs: 1 brick high step Has following equipment at home: Environmental Consultant - 2 wheeled, Wheelchair (manual), Shower bench, bed side commode, Grab bars, and hospital bed, sliding pad for car  PLOF: Independent with basic ADLs and Independent with household mobility without device  PATIENT GOALS: Walk around the block with his wife. Be able to use the dining room chair rather than have to use the W/C. Increased I with bed mobility and simple tasks at home like make some coffee or brush his teeth, Step over tub to shower, has bench, but he can't really use it. Up and down the one step to get to back deck. 11/23/22: Patient's wife Devere updated his goals today: Walk around store (gets distracted), swing a golf club again (thinking of driving range), get up on his own, walk without leaning forward, more recognition of the left side (from CVA), not need gait belt, OT-- be able to get dressed more on his own, navigate the newspaper.  OBJECTIVE:   TREATMENT: 04/17/24 Nustep level 5 x 5 minutes Bike level 4 x 5 minutes Gait outside with HHA up and down curbs, slopes all the way around the back building a rest at a table in the back, up the hill a standing rest and then to the table int he island to rest again, really struggled coming up the hill. Gait  out to the car  04/10/24 Nustep lvl 5 for 6 min LE SciFit lvl 4 for 5 min UBE Bike lvl 3 2 min fwd, 2 min bkwd Ambulating around the clinic to find cones for cardiovascular endurance, functional mobility, and visual  scanning.  Car and toilet transfers  04/05/24 Nustep level 5 x 6 minutes STM to the left upper trap Bike level 4 x 6 minutes Standing reaching for numbers called out up to a sequence of 4 Standing ball bouncing Gait outside around parking [place], rest and then around to the front, then 200 feet inside and out to car  03/27/24 Nustep level 5 x 6 minutes Bike level 5 x 5 minutes STM with the tgun to the left upper trap area Passive ROM to the left shoulder all motions to his tolerance UBE level 4 x 4 minutes Gait outside with HHA 1/2 the parking [place] and then rest and then around to the front of the building on the smooth surface Seated left LAQ, marches Standing ball bouncing Standing HHA side stepping and backward walking  03/20/24 Nustep level 5 x 6 minutes Bike level 4 x 5 minutes UBE level 3 x 4 minutes Gait outside 1/2 around the parking island rest and then through grass back to the front of the building some cues for step length Seated partial sit ups, worked on some trunk mobility trying to get him to lean side to side.  Ball bouncing in sitting and in standing.  Side stepping, gait 2 laps with HHA PROM of the left shoulder  03/15/2024 Nustep level 5 x 5 minutes Bike level 4 x 5 minutes Dribbling ball Partial sit ups Ball kicks Walked out side with HHA 1/2 way around parking [place] and then rest and then from there to the front door through some deep grass, this was very difficult for him, needed a lot of cues to take bigger steps and needed more assist, once we did this he had a hard time getting his feet and steps back. Did 240 feet HHA walking in the clinic and then to the car after this to get the stepping pattern back  03/08/24 NuStep L5x  UBE L3 x33mins each way- hard time keeping hand on bar today AB isometric 2x10 Ball squeezes 2x10 Standing ball dribbling Kicking different color cones Dribbling ball back and forth with wife   02/28/24 NuStep  L5x26mins UBE L2 x52mins each way  Seated PWR moves- holding big red ball touching floor and reaching overhead, rotations holding big red ball Punches with 2# weights  LAQ 5# 2x12 Modified sit ups 2x10  02/21/24 Nustep level 5 x 9 minutes UBE level 3 x 5 minutes Worked with wife on the lift from floor chair, is this feasible and possible for [facility] to use.  I worked on trying to simulate this, with him having to sit up from supine with him going to long sitting.  Difficult for him due to tight HS, then can he push himself up onto a small seating surface, again very difficulty for him as he was falling backward Passive HS stretches Seated PWR moves and instructed his wife with these Gait out back door through the grass to the car  02/14/24 Nustep level 5 x 6 minutes Bike level 4 x 5 minutes UBE level 4 x 5 minutes STM to the left shoulder area mainly upper trap Some genlte PROM of the left shoulder Gait with HHA out the back door around one of the islands  on the side of the back building back up the hill to the picnic table and rest and then through the grass on the side of the building to the front door, this was very fatiguing for him, a lot of cues verbal and physical to get him to stand up Gait out to the car  PATIENT EDUCATION: Education details: none new 02/16/23 Person educated: Patient and Spouse Education method: Explanation, Demonstration, Tactile cues, and Verbal cues Education comprehension: verbalized understanding, returned demonstration, verbal cues required, tactile cues required, and needs further education   ------------------------------------------------------------------------------------------------------- (Measures in this section from initial evaluation unless otherwise noted) DIAGNOSTIC FINDINGS:  MRI 2021 with degenerative changes in lumbar spine, L3-4 R subarticular stenosis COGNITION: Overall cognitive status: History of cognitive impairments - at  baseline SENSATION: Not tested COORDINATION: Moderately impaired BLE MUSCLE LENGTH: Hamstrings: severely restricted B Thomas test: Severely restricted B. POSTURE: rounded shoulders, forward head, increased thoracic kyphosis, posterior pelvic tilt, and flexed trunk  LOWER EXTREMITY ROM:   BLE extremely stiff throughout, limited hip ROM in all planes, B knees limited in extension. LOWER EXTREMITY MMT:  3+/5 throughout. Unable to determine accuracy of MMT due to cognition. Functionally demosntrates poor coordinated activation and poor muscular endurance. BED MOBILITY: Min A for Supine<> Sit. Patient was using a ladder in his hospital bed and could move MI. TRANSFERS: Min A, mod VC, tends to lean back.  11/02/22 independent with use of hands on arm chair with a couple of tries CURB: Min A-Has one step out to his deck. STAIRS: N/A  GAIT: Gait pattern: step to pattern, decreased arm swing- Right, decreased arm swing- Left, decreased step length- Right, decreased step length- Left, shuffling, festinating, trunk flexed, narrow BOS, poor foot clearance- Right, and poor foot clearance- Left Distance walked: 39' Assistive device utilized: Environmental Consultant - 2 wheeled Level of assistance: Occasional min A Comments: mod VC to stay close to walker, take longer steps. FUNCTIONAL TESTS:  5 times sit to stand: 38.23  09/29/22  could not performs 5XSTS, 11/02/22 = 47 seconds with cues to use arms, 07/21/23 really struggled today 55 seconds Timed up and go (TUG): 47.91 with RW, 07/21/22 TUG 29  seconds without device, 09/29/22 25 seconds, 11/02/22 TUG 20 seconds, 07/21/23 = 26 seconds no device SBA                                                                     PATIENT EDUCATION: Education details: Progress towards goals, POC Person educated: Patient and Spouse Education method: Programmer, Multimedia, Facilities Manager, and Verbal cues  Education comprehension: verbalized understanding, returned demonstration, verbal cues required, and  needs further education  HOME EXERCISE PROGRAM: PWR! moves  GOALS:  SHORT TERM GOALS: Target date: 01/25/24   Pt will perform HEP to maintain lower body strength, flexibility, posture with wife's assistance. Baseline:  Goal status: met 04/12/23  2. Pt will perform sit to stand, 8 of 10 reps with BUE support, with supervision, to maintain safety with transfers.  Baseline: min guard  Goal status: 02/07/23-CGA from elevated surface and max VC to lean forward, ongoing  continues to need a lot of cues for hand placement and to lean forward, tends to lean backward, needs cues and some assistance 02/28/24; 04/11/24 - requires CGA-MinA  with Sit>Stand  LONG TERM GOALS: Target date: 04/10/24   Patient will perform HEP progression to maintain lower body strength, flexibility, posture with wife's assistance. Baseline:  Goal status: has been able to walk with wife multiple times a day 03/27/24; pt states compliance to HEP and walking to and from the mailbox w wife every day 04/11/24  2.  Pt will ambulate 150-175 ft in 2 MWT with min guard assist, to preserve functional mobility within the home.  Baseline: Goal status:  met 08/25/23  3. Wife will verbalize tips for fall prevention education to minimize fall frequency Baseline:  Goal status:progressing and doing well 4.  Patient will be able to toilet without freezing for over a 4 week period met 11/15/23  5.  Patient will be able to get out of bed from supine with wife assist minimal Assist,met 10/06/23   ASSESSMENT:  CLINICAL IMPRESSION:  Patient reports doing well. We were able to go around the back building walking today, good steps going down the slope, coming up the slope he really struggled more shuffling. Demos mild inattention to the L side d/t previous CVA potentially. CGA-MinA with balance. He seems to be having more difficulty swallowing and with verbalizing.  OBJECTIVE IMPAIRMENTS: Abnormal gait, decreased activity tolerance, decreased  balance, decreased cognition, decreased coordination, decreased endurance, decreased mobility, difficulty walking, decreased ROM, decreased strength, decreased safety awareness, impaired flexibility, impaired UE functional use, improper body mechanics, and postural dysfunction.   PLAN:  PT FREQUENCY: 1x/week  PT DURATION: 12 weeks  PLANNED INTERVENTIONS: Therapeutic exercises, Therapeutic activity, Neuromuscular re-education, Balance training, Gait training, Patient/Family education, Self Care, Joint mobilization, Stair training, Moist heat, and Manual therapy  PLAN FOR NEXT SESSION:   Will continue to work with patient to help him and his wife stay living at home and with less difficulty   Patient Details  Name: William [name] MRN: [id number] Date of Birth: 17-Feb-1941 Referring Provider:  [name] Jamee [name], *  Ozell Mainland, PT

## 2024-04-18 DIAGNOSIS — M25512 Pain in left shoulder: Secondary | ICD-10-CM | POA: Diagnosis not present

## 2024-04-25 ENCOUNTER — Other Ambulatory Visit: Payer: Self-pay

## 2024-04-26 ENCOUNTER — Encounter: Payer: Self-pay | Admitting: Physical Therapy

## 2024-04-26 ENCOUNTER — Ambulatory Visit: Admitting: Physical Therapy

## 2024-04-26 DIAGNOSIS — R2689 Other abnormalities of gait and mobility: Secondary | ICD-10-CM

## 2024-04-26 DIAGNOSIS — R296 Repeated falls: Secondary | ICD-10-CM

## 2024-04-26 DIAGNOSIS — Z9181 History of falling: Secondary | ICD-10-CM

## 2024-04-26 DIAGNOSIS — M6281 Muscle weakness (generalized): Secondary | ICD-10-CM

## 2024-04-26 DIAGNOSIS — R262 Difficulty in walking, not elsewhere classified: Secondary | ICD-10-CM

## 2024-04-26 DIAGNOSIS — I69354 Hemiplegia and hemiparesis following cerebral infarction affecting left non-dominant side: Secondary | ICD-10-CM

## 2024-04-26 DIAGNOSIS — M25512 Pain in left shoulder: Secondary | ICD-10-CM

## 2024-04-26 NOTE — Therapy (Signed)
 OUTPATIENT PHYSICAL THERAPY NEURO TREATMENT & PROGRESS REPORT Progress Note Reporting Period 02/21/24 to 04/26/24 for visits 91-100  See note below for Objective Data and Assessment of Progress/Goals.       Patient Name: William [name] MRN: [id number] DOB:Mar 14, 1941, 83 y.o., male Today's Date: 04/26/2024  PCP: [name]  DO REFERRING PROVIDER: same   END OF SESSION:  PT End of Session - 04/26/24 1441     Visit Number 100    Date for Recertification  06/10/24    Authorization Type HTA    PT Start Time 1440    PT Stop Time 1525    PT Time Calculation (min) 45 min    Equipment Utilized During Treatment Gait belt    Activity Tolerance Patient tolerated treatment well    Behavior During Therapy WFL for tasks assessed/performed                  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   HTN (hypertension) 07/19/2015   Hyperlipidemia    Kidney disease, chronic, stage III (GFR 30-59 ml/min) (HCC)    Major neurocognitive disorder 01/09/2014   Mild, related to stroke history   Nocturia    Parkinson disease (HCC)    Renal insufficiency 06/25/2013   S/P carotid endarterectomy    BILATERAL ICA--  PATENT PER DUPLEX  05-19-2012   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 09/26/2017   Right calf   Urinary frequency    Vitamin D  deficiency    Past Surgical History:  Procedure Laterality Date   APPENDECTOMY  AS CHILD   CARDIOVASCULAR STRESS TEST  03-27-2012  DR CRENSHAW   LOW RISK LEXISCAN  STUDY-- PROBABLE NORMAL PERFUSION AND SOFT TISSUE ATTENUATION/   NO ISCHEMIA/ EF 51%   CAROTID ENDARTERECTOMY Bilateral LEFT  11-12-2008  DR GREG HAYES   RIGHT ICA  2006  ([facility])   CHOLECYSTECTOMY N/A 02/23/2022   Procedure: LAPAROSCOPIC CHOLECYSTECTOMY;  Surgeon: [name], MD;  Location: [facility] OR;  Service: General;  Laterality: N/A;   CYSTOSCOPY W/ RETROGRADES Bilateral 06/22/2021   Procedure: CYSTOSCOPY WITH RETROGRADE PYELOGRAM;  Surgeon: [name], MD;  Location: [facility];  Service: Urology;  Laterality: Bilateral;   CYSTOSCOPY WITH LITHOLAPAXY N/A 02/26/2013   Procedure: CYSTOSCOPY WITH LITHOLAPAXY;  Surgeon: Senior Matilda, MD;  Location: [facility];  Service: Urology;  Laterality: N/A;   ENDOSCOPIC RETROGRADE CHOLANGIOPANCREATOGRAPHY (ERCP) WITH PROPOFOL  N/A 02/22/2022   Procedure: ENDOSCOPIC RETROGRADE CHOLANGIOPANCREATOGRAPHY (ERCP) WITH PROPOFOL ;  Surgeon: [name], MD;  Location: [facility] ENDOSCOPY;  Service: Gastroenterology;  Laterality: N/A;   EYE SURGERY  Jan. 2016   cataract surgery both eyes   INGUINAL HERNIA REPAIR Right 11-08-2006   IR KYPHO EA ADDL LEVEL THORACIC OR LUMBAR  02/12/2021   IR RADIOLOGIST EVAL & MGMT  02/18/2021   MASS EXCISION N/A 03/03/2016   Procedure: EXCISION OF BACK  MASS;  Surgeon: [name], MD;  Location: [facility];  Service: General;  Laterality: N/A;  MOHS SURGERY Left 1/ 2016   [name]-- Basal cell   PROSTATE SURGERY     REMOVAL OF STONES  02/22/2022   Procedure: REMOVAL OF STONES;  Surgeon: [name], MD;  Location: [facility] ENDOSCOPY;  Service: Gastroenterology;;   [name]  02/22/2022   Procedure: [name];  Surgeon: [name], MD;  Location: [facility] ENDOSCOPY;  Service: Gastroenterology;;   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 N/A 06/22/2021   Procedure: TRANSURETHRAL RESECTION OF BLADDER TUMOR;  Surgeon: [name], MD;  Location: [facility];  Service: Urology;  Laterality: N/A;    TRANSURETHRAL RESECTION OF PROSTATE N/A 02/26/2013   Procedure: TRANSURETHRAL RESECTION OF THE PROSTATE WITH GYRUS INSTRUMENTS;  Surgeon: Senior Matilda, MD;  Location: [facility];  Service: Urology;  Laterality: N/A;   TRANSURETHRAL RESECTION OF PROSTATE N/A 06/22/2021   Procedure: TRANSURETHRAL RESECTION OF THE PROSTATE (TURP);  Surgeon: [name], MD;  Location: [facility];  Service: Urology;  Laterality: N/A;   Patient Active Problem List   Diagnosis Date Noted   Bronchitis 06/16/2023   Impacted cerumen of left ear 06/16/2023   Acute encephalopathy 04/18/2023   Parkinson's disease (HCC) 04/18/2023   History of CVA with residual deficit 04/18/2023   Chronic kidney disease, stage 3b (HCC) 04/18/2023   Dementia with behavioral disturbance (HCC) 04/18/2023   Anemia of chronic disease 04/18/2023   Laceration of right upper extremity 02/24/2023   Dermatitis 02/24/2023   Lung nodules 02/24/2023   Dysuria 08/03/2022   Nonintractable headache 07/01/2022   Bilateral impacted cerumen 06/24/2022   Rash 06/15/2022   Postoperative ileus (HCC) 02/27/2022   Ileus, postoperative (HCC) 02/26/2022   Choledocholithiasis 02/19/2022   DNR (do not resuscitate) 02/19/2022   Left elbow pain 01/26/2022   Mid back pain on left side 01/26/2022   Rib pain 01/26/2022   Acute pain of left shoulder 11/12/2021   Leukocytes in urine 11/12/2021   Urinary frequency 11/12/2021   Thrush 10/08/2021   Hemiplegia, 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   Insomnia    Prediabetes    Acute 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 (HCC)    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   Transaminitis 07/27/2021   UTI (urinary tract infection) 07/27/2021   CVA (cerebral vascular accident) (HCC) 07/27/2021   Fall 07/27/2021   Hyperglycemia 07/27/2021   Cholelithiasis 07/27/2021   Hypoxia 07/27/2021   Nausea and vomiting 07/27/2021    Acute metabolic encephalopathy 07/27/2021   Normocytic anemia 07/27/2021   Chronic back pain 07/27/2021   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   Closed fracture of first lumbar vertebra with routine healing 02/03/2021   Closed fracture of multiple ribs 11/18/2020   Anxiety 01/29/2020   Leg pain, bilateral 01/29/2020   Ingrown toenail 07/13/2019   Lumbar spondylosis 05/02/2018   Pain in left knee 03/09/2018   Osteoarthritis of left hip 01/16/2018   Trochanteric bursitis of left hip 01/16/2018   Preventative health care 09/26/2017   HTN (hypertension) 07/19/2015   Hyperlipidemia 07/19/2015   Great toe pain 02/11/2014   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   Obesity (BMI 30-39.9) 06/25/2013   Renal insufficiency 06/25/2013   Weakness of left arm 06/25/2013   Sebaceous cyst 03/03/2011   Sprain of lumbar region 07/31/2010   Rib pain, left 08/29/2009   Carotid artery stenosis, asymptomatic, bilateral 05/02/2009   Eczema, atopic 05/31/2008   Vitamin D  deficiency 03/01/2008   BPH (benign prostatic hyperplasia)  08/06/2007   Fasting hyperglycemia 12/21/2006   History of right MCA infarct 2006    ONSET DATE: 06/29/22 REFERRING DIAG:  Diagnosis  I63.9 (ICD-10-CM) - Cerebrovascular accident (CVA), unspecified mechanism (HCC)  W19.XXXD (ICD-10-CM) - Fall, subsequent encounter    THERAPY DIAG:  Muscle weakness (generalized)  Other abnormalities of gait and mobility  Hemiplegia and hemiparesis following cerebral infarction affecting left non-dominant side (HCC)  Difficulty in walking, not elsewhere classified  Repeated falls  Acute pain of left shoulder  History of falling  Rationale for Evaluation and Treatment: Rehabilitation  SUBJECTIVE:                                                                                                                                                                                          SUBJECTIVE STATEMENT:  Spouse reports that he may start a new medication for sundowners.  Reports that she has continued to walk him most days to the mailbox, but sometimes does not feel safe  PERTINENT HISTORY:  William [name] is a 83 year old man with dementia, CKD, HTN, CAD, HLD and history of CVA  (2006, 2023), Parkinson's  PAIN:  Are you having pain? Faces/behavior scale- mild pain BLEs  and back   PRECAUTIONS: Fall  WEIGHT BEARING RESTRICTIONS: No  FALLS: Has patient fallen in last 6 months? No  LIVING ENVIRONMENT: Lives with: lives with their family and lives with their spouse Lives in: House/apartment Stairs: 1 brick high step Has following equipment at home: Environmental Consultant - 2 wheeled, Wheelchair (manual), Shower bench, bed side commode, Grab bars, and hospital bed, sliding pad for car  PLOF: Independent with basic ADLs and Independent with household mobility without device  PATIENT GOALS: Walk around the block with his wife. Be able to use the dining room chair rather than have to use the W/C. Increased I with bed mobility and simple tasks at home like make some coffee or brush his teeth, Step over tub to shower, has bench, but he can't really use it. Up and down the one step to get to back deck. 11/23/22: Patient's wife Devere updated his goals today: Walk around store (gets distracted), swing a golf club again (thinking of driving range), get up on his own, walk without leaning forward, more recognition of the left side (from CVA), not need gait belt, OT-- be able to get dressed more on his own, navigate the newspaper.  OBJECTIVE:   TREATMENT: 04/26/24 Tug 15 seconds Gait outside around 1/2 parking [place] and then through grass to the front door Partial sit ups Cone reaching with trunk rotation and elongation and to the floor, PT call  out color and he would reach out and or kick Standing fall catch, bounce or kick fairly rapidly Bike 5 minutes  04/17/24 Nustep level 5 x 5  minutes Bike level 4 x 5 minutes Gait outside with HHA up and down curbs, slopes all the way around the back building a rest at a table in the back, up the hill a standing rest and then to the table int he island to rest again, really struggled coming up the hill. Gait out to the car  04/10/24 Nustep lvl 5 for 6 min LE SciFit lvl 4 for 5 min UBE Bike lvl 3 2 min fwd, 2 min bkwd Ambulating around the clinic to find cones for cardiovascular endurance, functional mobility, and visual scanning.  Car and toilet transfers  04/05/24 Nustep level 5 x 6 minutes STM to the left upper trap Bike level 4 x 6 minutes Standing reaching for numbers called out up to a sequence of 4 Standing ball bouncing Gait outside around parking [place], rest and then around to the front, then 200 feet inside and out to car  03/27/24 Nustep level 5 x 6 minutes Bike level 5 x 5 minutes STM with the tgun to the left upper trap area Passive ROM to the left shoulder all motions to his tolerance UBE level 4 x 4 minutes Gait outside with HHA 1/2 the parking [place] and then rest and then around to the front of the building on the smooth surface Seated left LAQ, marches Standing ball bouncing Standing HHA side stepping and backward walking  03/20/24 Nustep level 5 x 6 minutes Bike level 4 x 5 minutes UBE level 3 x 4 minutes Gait outside 1/2 around the parking island rest and then through grass back to the front of the building some cues for step length Seated partial sit ups, worked on some trunk mobility trying to get him to lean side to side.  Ball bouncing in sitting and in standing.  Side stepping, gait 2 laps with HHA PROM of the left shoulder  03/15/2024 Nustep level 5 x 5 minutes Bike level 4 x 5 minutes Dribbling ball Partial sit ups Ball kicks Walked out side with HHA 1/2 way around parking [place] and then rest and then from there to the front door through some deep grass, this was very difficult for  him, needed a lot of cues to take bigger steps and needed more assist, once we did this he had a hard time getting his feet and steps back. Did 240 feet HHA walking in the clinic and then to the car after this to get the stepping pattern back  03/08/24 NuStep L5x  UBE L3 x65mins each way- hard time keeping hand on bar today AB isometric 2x10 Ball squeezes 2x10 Standing ball dribbling Kicking different color cones Dribbling ball back and forth with wife   02/28/24 NuStep L5x42mins UBE L2 x53mins each way  Seated PWR moves- holding big red ball touching floor and reaching overhead, rotations holding big red ball Punches with 2# weights  LAQ 5# 2x12 Modified sit ups 2x10  02/21/24 Nustep level 5 x 9 minutes UBE level 3 x 5 minutes Worked with wife on the lift from floor chair, is this feasible and possible for William [name] to use.  I worked on trying to simulate this, with him having to sit up from supine with him going to long sitting.  Difficult for him due to tight HS, then can he push himself up onto a  small seating surface, again very difficulty for him as he was falling backward Passive HS stretches Seated PWR moves and instructed his wife with these Gait out back door through the grass to the car  02/14/24 Nustep level 5 x 6 minutes Bike level 4 x 5 minutes UBE level 4 x 5 minutes STM to the left shoulder area mainly upper trap Some genlte PROM of the left shoulder Gait with HHA out the back door around one of the islands on the side of the back building back up the hill to the picnic table and rest and then through the grass on the side of the building to the front door, this was very fatiguing for him, a lot of cues verbal and physical to get him to stand up Gait out to the car  PATIENT EDUCATION: Education details: none new 02/16/23 Person educated: Patient and Spouse Education method: Explanation, Demonstration, Tactile cues, and Verbal cues Education comprehension: verbalized  understanding, returned demonstration, verbal cues required, tactile cues required, and needs further education   ------------------------------------------------------------------------------------------------------- (Measures in this section from initial evaluation unless otherwise noted) DIAGNOSTIC FINDINGS:  MRI 2021 with degenerative changes in lumbar spine, L3-4 R subarticular stenosis COGNITION: Overall cognitive status: History of cognitive impairments - at baseline SENSATION: Not tested COORDINATION: Moderately impaired BLE MUSCLE LENGTH: Hamstrings: severely restricted B Thomas test: Severely restricted B. POSTURE: rounded shoulders, forward head, increased thoracic kyphosis, posterior pelvic tilt, and flexed trunk  LOWER EXTREMITY ROM:   BLE extremely stiff throughout, limited hip ROM in all planes, B knees limited in extension. LOWER EXTREMITY MMT:  3+/5 throughout. Unable to determine accuracy of MMT due to cognition. Functionally demosntrates poor coordinated activation and poor muscular endurance. BED MOBILITY: Min A for Supine<> Sit. Patient was using a ladder in his hospital bed and could move MI. TRANSFERS: Min A, mod VC, tends to lean back.  11/02/22 independent with use of hands on arm chair with a couple of tries CURB: Min A-Has one step out to his deck. STAIRS: N/A  GAIT: Gait pattern: step to pattern, decreased arm swing- Right, decreased arm swing- Left, decreased step length- Right, decreased step length- Left, shuffling, festinating, trunk flexed, narrow BOS, poor foot clearance- Right, and poor foot clearance- Left Distance walked: 44' Assistive device utilized: Environmental Consultant - 2 wheeled Level of assistance: Occasional min A Comments: mod VC to stay close to walker, take longer steps. FUNCTIONAL TESTS:  5 times sit to stand: 38.23  09/29/22  could not performs 5XSTS, 11/02/22 = 47 seconds with cues to use arms, 07/21/23 really struggled today 55 seconds Timed up and go  (TUG): 47.91 with RW, 07/21/22 TUG 29  seconds without device, 09/29/22 25 seconds, 11/02/22 TUG 20 seconds, 07/21/23 = 26 seconds no device SBA                                                                     PATIENT EDUCATION: Education details: Progress towards goals, POC Person educated: Patient and Spouse Education method: Programmer, Multimedia, Facilities Manager, and Verbal cues  Education comprehension: verbalized understanding, returned demonstration, verbal cues required, and needs further education  HOME EXERCISE PROGRAM: PWR! moves  GOALS:  SHORT TERM GOALS: Target date: 01/25/24   Pt will perform HEP to maintain  lower body strength, flexibility, posture with wife's assistance. Baseline:  Goal status: met 04/12/23  2. Pt will perform sit to stand, 8 of 10 reps with BUE support, with supervision, to maintain safety with transfers.  Baseline: min guard  Goal status: 02/07/23-CGA from elevated surface and max VC to lean forward, ongoing  continues to need a lot of cues for hand placement and to lean forward, tends to lean backward, needs cues and some assistance 02/28/24; 04/11/24 - requires CGA-MinA with Sit>Stand, can transfer with SBA and VC's 04/26/24  LONG TERM GOALS: Target date: 04/10/24   Patient will perform HEP progression to maintain lower body strength, flexibility, posture with wife's assistance. Baseline:  Goal status: has been able to walk with wife multiple times a day 03/27/24; pt states compliance to HEP and walking to and from the mailbox w wife every day 04/11/24  2.  Pt will ambulate 150-175 ft in 2 MWT with min guard assist, to preserve functional mobility within the home.  Baseline: Goal status:  met 08/25/23  3. Wife will verbalize tips for fall prevention education to minimize fall frequency Baseline:  Goal status:progressing and doing well 4.  Patient will be able to toilet without freezing for over a 4 week period met 11/15/23  5.  Patient will be able to get out of  bed from supine with wife assist minimal Assist,met 10/06/23   ASSESSMENT:  CLINICAL IMPRESSION:  Patient reports doing well. We were able to go around the back building walking today, good steps going down the slope, coming up the slope he really struggled more shuffling. Demos mild inattention to the L side d/t previous CVA potentially. CGA-MinA with balance. He seems to be having more difficulty swallowing and with verbalizing.  OBJECTIVE IMPAIRMENTS: Abnormal gait, decreased activity tolerance, decreased balance, decreased cognition, decreased coordination, decreased endurance, decreased mobility, difficulty walking, decreased ROM, decreased strength, decreased safety awareness, impaired flexibility, impaired UE functional use, improper body mechanics, and postural dysfunction.   PLAN:  PT FREQUENCY: 1x/week  PT DURATION: 12 weeks  PLANNED INTERVENTIONS: Therapeutic exercises, Therapeutic activity, Neuromuscular re-education, Balance training, Gait training, Patient/Family education, Self Care, Joint mobilization, Stair training, Moist heat, and Manual therapy  PLAN FOR NEXT SESSION:   Will continue to work with patient to help him and his wife stay living at home and with less difficulty   Patient Details  Name: William [name] MRN: [id number] Date of Birth: Oct 13, 1940 Referring Provider:  [name], *  Ozell Mainland, PT

## 2024-04-30 ENCOUNTER — Emergency Department (HOSPITAL_BASED_OUTPATIENT_CLINIC_OR_DEPARTMENT_OTHER)
Admission: EM | Admit: 2024-04-30 | Discharge: 2024-04-30 | Disposition: A | Discharge status: Home / Self Care | Attending: Emergency Medicine | Admitting: Emergency Medicine

## 2024-04-30 ENCOUNTER — Other Ambulatory Visit: Payer: Self-pay

## 2024-04-30 ENCOUNTER — Emergency Department (HOSPITAL_BASED_OUTPATIENT_CLINIC_OR_DEPARTMENT_OTHER)

## 2024-04-30 ENCOUNTER — Ambulatory Visit: Payer: Self-pay

## 2024-04-30 ENCOUNTER — Telehealth (HOSPITAL_COMMUNITY): Payer: Self-pay

## 2024-04-30 DIAGNOSIS — K5900 Constipation, unspecified: Secondary | ICD-10-CM | POA: Diagnosis not present

## 2024-04-30 DIAGNOSIS — Z7902 Long term (current) use of antithrombotics/antiplatelets: Secondary | ICD-10-CM | POA: Insufficient documentation

## 2024-04-30 DIAGNOSIS — R053 Chronic cough: Secondary | ICD-10-CM | POA: Diagnosis not present

## 2024-04-30 DIAGNOSIS — N183 Chronic kidney disease, stage 3 unspecified: Secondary | ICD-10-CM | POA: Diagnosis not present

## 2024-04-30 DIAGNOSIS — G20A1 Parkinson's disease without dyskinesia, without mention of fluctuations: Secondary | ICD-10-CM | POA: Diagnosis not present

## 2024-04-30 DIAGNOSIS — Z79899 Other long term (current) drug therapy: Secondary | ICD-10-CM | POA: Diagnosis not present

## 2024-04-30 DIAGNOSIS — N400 Enlarged prostate without lower urinary tract symptoms: Secondary | ICD-10-CM | POA: Diagnosis not present

## 2024-04-30 DIAGNOSIS — I129 Hypertensive chronic kidney disease with stage 1 through stage 4 chronic kidney disease, or unspecified chronic kidney disease: Secondary | ICD-10-CM | POA: Insufficient documentation

## 2024-04-30 DIAGNOSIS — R944 Abnormal results of kidney function studies: Secondary | ICD-10-CM | POA: Insufficient documentation

## 2024-04-30 DIAGNOSIS — R4182 Altered mental status, unspecified: Secondary | ICD-10-CM | POA: Insufficient documentation

## 2024-04-30 DIAGNOSIS — R948 Abnormal results of function studies of other organs and systems: Secondary | ICD-10-CM | POA: Diagnosis not present

## 2024-04-30 DIAGNOSIS — R82998 Other abnormal findings in urine: Secondary | ICD-10-CM | POA: Diagnosis not present

## 2024-04-30 DIAGNOSIS — Z85828 Personal history of other malignant neoplasm of skin: Secondary | ICD-10-CM | POA: Insufficient documentation

## 2024-04-30 DIAGNOSIS — R935 Abnormal findings on diagnostic imaging of other abdominal regions, including retroperitoneum: Secondary | ICD-10-CM | POA: Diagnosis not present

## 2024-04-30 DIAGNOSIS — R109 Unspecified abdominal pain: Secondary | ICD-10-CM | POA: Diagnosis not present

## 2024-04-30 DIAGNOSIS — Z91040 Latex allergy status: Secondary | ICD-10-CM | POA: Insufficient documentation

## 2024-04-30 DIAGNOSIS — N281 Cyst of kidney, acquired: Secondary | ICD-10-CM | POA: Diagnosis not present

## 2024-04-30 DIAGNOSIS — I700 Atherosclerosis of aorta: Secondary | ICD-10-CM | POA: Diagnosis not present

## 2024-04-30 LAB — CBC
HCT: 35.2 % — ABNORMAL LOW (ref 39.0–52.0)
Hemoglobin: 11.8 g/dL — ABNORMAL LOW (ref 13.0–17.0)
MCH: 33.2 pg (ref 26.0–34.0)
MCHC: 33.5 g/dL (ref 30.0–36.0)
MCV: 99.2 fL (ref 80.0–100.0)
Platelets: 154 K/uL (ref 150–400)
RBC: 3.55 MIL/uL — ABNORMAL LOW (ref 4.22–5.81)
RDW: 13.3 % (ref 11.5–15.5)
WBC: 6.2 K/uL (ref 4.0–10.5)
nRBC: 0 % (ref 0.0–0.2)

## 2024-04-30 LAB — COMPREHENSIVE METABOLIC PANEL WITH GFR
ALT: <5 U/L (ref 0–44)
AST: 11 U/L — ABNORMAL LOW (ref 15–41)
Albumin: 3.9 g/dL (ref 3.5–5.0)
Alkaline Phosphatase: 162 U/L — ABNORMAL HIGH (ref 38–126)
Anion gap: 8 (ref 5–15)
BUN: 28 mg/dL — ABNORMAL HIGH (ref 8–23)
CO2: 28 mmol/L (ref 22–32)
Calcium: 9.3 mg/dL (ref 8.9–10.3)
Chloride: 104 mmol/L (ref 98–111)
Creatinine, Ser: 1.75 mg/dL — ABNORMAL HIGH (ref 0.61–1.24)
GFR, Estimated: 39 mL/min — ABNORMAL LOW (ref >60)
Glucose, Bld: 93 mg/dL (ref 70–99)
Potassium: 4.4 mmol/L (ref 3.5–5.1)
Sodium: 140 mmol/L (ref 135–145)
Total Bilirubin: 0.3 mg/dL (ref 0.0–1.2)
Total Protein: 7.3 g/dL (ref 6.5–8.1)

## 2024-04-30 LAB — URINALYSIS, ROUTINE W REFLEX MICROSCOPIC
Bacteria, UA: NONE SEEN
Bilirubin Urine: NEGATIVE
Glucose, UA: NEGATIVE mg/dL
Ketones, ur: NEGATIVE mg/dL
Nitrite: NEGATIVE
Protein, ur: 30 mg/dL — AB
Specific Gravity, Urine: 1.017 (ref 1.005–1.030)
pH: 6.5 (ref 5.0–8.0)

## 2024-04-30 MED ORDER — IOHEXOL 300 MG/ML  SOLN
100.0000 mL | Freq: Once | INTRAMUSCULAR | Status: AC | PRN
  Administered 2024-04-30: 100 mL via INTRAVENOUS

## 2024-04-30 MED ORDER — SODIUM CHLORIDE 0.9 % IV BOLUS
1000.0000 mL | Freq: Once | INTRAVENOUS | Status: AC
  Administered 2024-04-30: 1000 mL via INTRAVENOUS

## 2024-04-30 MED ORDER — FLEET ENEMA RE ENEM
1.0000 | ENEMA | Freq: Once | RECTAL | Status: AC
  Administered 2024-04-30: 1 via RECTAL
  Filled 2024-04-30: qty 1

## 2024-04-30 NOTE — ED Provider Notes (Signed)
 William [name] Provider Note   CSN: [id number] Arrival date & time: 04/30/24  1243     Patient presents with: Altered Mental Status and Abdominal Pain   William [name] is a 83 y.o. male with history of Parkinson's, hypertension, hyperlipidemia, CVA x 2 presents with complaints of altered mental status x 1 week.  Patient was complaining of some abdominal pain.  Has had some foul-smelling urine at home.  Is typically ambulatory but seems to be slower than normal.  Is tolerating p.o.  Has a chronic cough at baseline.  No worsening.  No vomiting or diarrhea.     Altered Mental Status Associated symptoms: abdominal pain   Abdominal Pain   Past Medical History:  Diagnosis Date  . Arthritis    low back  . 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 Bladder stone   . BPH (benign prostatic hyperplasia) 08/06/2007  . 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 Closed fracture of fifth metacarpal bone 05/15/2015  . Eczema   . Fasting hyperglycemia 12/21/2006  . GERD (gastroesophageal reflux disease)   . 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 HTN (hypertension) 07/19/2015  . Hyperlipidemia   . Kidney disease, chronic, stage III (GFR 30-59 ml/min) (HCC)   . 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 Nocturia   . Parkinson disease (HCC)   . Renal insufficiency 06/25/2013  . S/P carotid endarterectomy    BILATERAL ICA--  PATENT PER DUPLEX  05-19-2012  . 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 Urinary frequency   . Vitamin D  deficiency    Past Surgical History:  Procedure Laterality Date  . APPENDECTOMY  AS CHILD  . CARDIOVASCULAR STRESS TEST  03-27-2012  DR CRENSHAW   LOW RISK LEXISCAN  STUDY-- PROBABLE NORMAL PERFUSION AND SOFT TISSUE ATTENUATION/  NO ISCHEMIA/ EF 51%  . CAROTID ENDARTERECTOMY Bilateral LEFT  11-12-2008   DR GREG HAYES   RIGHT ICA  2006  ([facility])  . CHOLECYSTECTOMY N/A 02/23/2022   Procedure: LAPAROSCOPIC CHOLECYSTECTOMY;  Surgeon: William Deward JINNY, MD;  Location: [facility];  Service: General;  Laterality: N/A;  . CYSTOSCOPY W/ RETROGRADES Bilateral 06/22/2021   Procedure: CYSTOSCOPY WITH RETROGRADE PYELOGRAM;  Surgeon: William Senior, MD;  Location: [facility];  Service: Urology;  Laterality: Bilateral;  . CYSTOSCOPY WITH LITHOLAPAXY N/A 02/26/2013   Procedure: CYSTOSCOPY WITH LITHOLAPAXY;  Surgeon: [name] Matilda, MD;  Location: [name];  Service: Urology;  Laterality: N/A;  . ENDOSCOPIC RETROGRADE CHOLANGIOPANCREATOGRAPHY (ERCP) WITH PROPOFOL  N/A 02/22/2022   Procedure: ENDOSCOPIC RETROGRADE CHOLANGIOPANCREATOGRAPHY (ERCP) WITH PROPOFOL ;  Surgeon: William Dover, MD;  [name];  Service: Gastroenterology;  Laterality: N/A;  . EYE SURGERY  Jan. 2016   cataract surgery both eyes  . INGUINAL HERNIA REPAIR Right 11-08-2006  . IR KYPHO EA ADDL LEVEL THORACIC OR LUMBAR  02/12/2021  . IR RADIOLOGIST EVAL & MGMT  02/18/2021  . MASS EXCISION N/A 03/03/2016   Procedure: EXCISION OF BACK  MASS;  Surgeon: William Nephew, MD;  Location: [facility];  Service: General;  Laterality: N/A;  . MOHS SURGERY Left 1/ 2016   Dr William-- Basal cell  . PROSTATE SURGERY    . REMOVAL OF STONES  02/22/2022   Procedure: REMOVAL OF STONES;  Surgeon: William Dover, MD;  Location:  [name];  Service: Gastroenterology;;  . [name]  02/22/2022   Procedure: SPHINCTEROTOMY;  Surgeon: William Dover, MD;  Location: [facility];  Service: Gastroenterology;;  . 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 TRANSURETHRAL RESECTION OF PROSTATE N/A 02/26/2013   Procedure:  TRANSURETHRAL RESECTION OF THE PROSTATE WITH GYRUS INSTRUMENTS;  Surgeon: [name] Matilda, MD;  Location: [facility];  Service: Urology;  Laterality: N/A;  . TRANSURETHRAL RESECTION OF PROSTATE N/A 06/22/2021   Procedure: TRANSURETHRAL RESECTION OF THE PROSTATE (TURP);  Surgeon: William Senior, MD;  Location: [facility];  Service: Urology;  Laterality: N/A;       Prior to Admission medications   Medication Sig Start Date End Date Taking? Authorizing Provider  acetaminophen  (TYLENOL ) 325 MG tablet Take 2 tablets (650 mg total) by mouth every 6 (six) hours as needed for mild pain. 02/24/22   Rosalba Glendale DEL, PA-C  amantadine  (SYMMETREL ) 100 MG capsule Take 1 capsule (100 mg total) by mouth daily. 03/07/24   William Cyndee William [name], DO  amLODipine  (NORVASC ) 10 MG tablet Take 1 tablet (10 mg total) by mouth every morning. 12/29/23   [name], William R, DO  bacitracin  ointment Apply 1 Application topically 2 (two) times daily. 04/24/23   William Savannah, PA-C  brexpiprazole  ([name] ) 2 MG TABS tablet Take 1 tablet (2 mg total) by mouth at bedtime. 04/13/24 05/19/24  [name], Elna, MD  carBAMazepine  (TEGRETOL ) 100 MG/5ML suspension Take 5 mLs (100 mg total) by mouth in the morning AND 5 mLs (100 mg total) daily at 2 PM AND 10 mLs (200 mg total) at bedtime. 04/10/24   William [name], Elna, MD  carbidopa -levodopa  (SINEMET  IR) 25-100 MG tablet Take 1.5 tablets by mouth 3 (three) times daily. 04/16/24   [name], William R, DO  cephALEXin  (KEFLEX ) 500 MG capsule Take 1 capsule (500 mg total) by mouth 2 (two) times daily. 12/20/23   William Cyndee William R, DO  Cholecalciferol  (VITAMIN D3) 50 MCG (2000 UT) CHEW Chew 1 tablet by mouth daily.    [provider]  clobetasol  cream (TEMOVATE ) 0.05 % Apply 1 Application to affected areas topically up to 2 (two) times daily as needed. Do not apply to face, groin, or under arms 11/02/22   William Rush, MD  clopidogrel  (PLAVIX ) 75 MG tablet  Take 1 tablet (75 mg total) by mouth daily. 03/02/24   William Cyndee William [name], DO  cyanocobalamin  (VITAMIN B12) 1000 MCG tablet Take 1 tablet (1,000 mcg total) by mouth daily. 03/02/24   William Cyndee William [name], DO  EPINEPHrine  (EPIPEN  2-PAK) 0.3 mg/0.3 mL IJ SOAJ injection Inject 0.3 mg into the muscle as needed for anaphylaxis. 12/20/23   William Cyndee William [name], DO  ferrous sulfate  325 (65 FE) MG EC tablet Take 325 mg by mouth 2 (two) times daily. *Crushed*    [provider]  fluticasone  (CUTIVATE ) 0.05 % cream 1 application  at bedtime as needed (psoriasis). 12/16/16   [provider]  glycerin  adult 2 g suppository Place 1 suppository rectally as needed for constipation. 03/10/24   William Savannah, PA-C  hydrALAZINE  (APRESOLINE ) 25 MG tablet Take 1 tablet (25 mg total) by mouth 3 (three) times daily. 02/20/24   William Cyndee William [name], DO  LORazepam  (ATIVAN ) 1 MG tablet Take 1/4 tablet by mouth in the morning, 1/2  tablet by mouth at lunch and 1 tablet by mouth at bedtime 04/04/23   William [name], Elna, MD  LORazepam  (ATIVAN ) 1 MG tablet Take 1/4th (0.25 mg) tablet in the morning, 1/2 (half) tablet at 2pm and 1 (1mg ) tablet by mouth at bedtime. 08/09/23   William [name], Elna, MD  LORazepam  (ATIVAN ) 1 MG tablet Take 1/4 tablet (0.25 mg total) by mouth in the morning AND 1/2 tablet (0.5 mg total) daily in the afternoon AND 1 tablet (1 mg total) at bedtime. 11/08/23   [name], Elna, MD  magnesium  hydroxide (MILK OF MAGNESIA) 400 MG/5ML suspension Take 60 mLs by mouth daily as needed for moderate constipation. 03/10/24   William Savannah, PA-C  Multiple Vitamins-Minerals (ADULT ONE DAILY GUMMIES) CHEW Chew 1 tablet by mouth every morning.    [provider]  NONFORMULARY OR COMPOUNDED ITEM Transport chair  dx parkinsons, hx falls 04/21/23   William [name], William SAUNDERS, DO  pantoprazole  (PROTONIX ) 40 MG tablet Take 1 tablet (40 mg total) by mouth daily. 11/21/23   William [name], William R, DO  polyethylene glycol  (MIRALAX  / GLYCOLAX ) 17 g packet Take 17 g by mouth daily. Patient taking differently: Take 17 g by mouth every other day. 10/02/21   William Hamilton, MD  tamsulosin  (FLOMAX ) 0.4 MG CAPS capsule Take 1 capsule (0.4 mg total) by mouth daily after supper. Patient taking differently: Take 0.4 mg by mouth at bedtime. 04/11/23   William [name] William SAUNDERS, DO  tamsulosin  (FLOMAX ) 0.4 MG CAPS capsule Take 1 capsule (0.4 mg total) by mouth at bedtime. 06/02/23     traZODone  (DESYREL ) 150 MG tablet Take 0.5 tablets (75 mg total) by mouth at bedtime. *Crushed* 11/08/23   [name], Elna, MD  triamcinolone  cream (KENALOG ) 0.1 % Apply 1 application topically 2 (two) times daily. 11/15/22   William [name] William SAUNDERS, DO    Allergies: Bee venom, Strawberry extract, Latex, Zetia  [ezetimibe ], Adhesive [tape], and Statins    Review of Systems  Gastrointestinal:  Positive for abdominal pain.    Updated Vital Signs BP (!) 159/62   Pulse 65   Temp 97.7 F (36.5 C) (Oral)   Resp 18   SpO2 95%   Physical Exam Vitals and nursing note reviewed.  Constitutional:      General: He is not in acute distress.    Appearance: He is well-developed.  HENT:     Head: Normocephalic and atraumatic.  Eyes:     Conjunctiva/sclera: Conjunctivae normal.  Cardiovascular:     Rate and Rhythm: Normal rate and regular rhythm.     Heart sounds: No murmur heard. Pulmonary:     Effort: Pulmonary effort is normal. No respiratory distress.     Breath sounds: Normal breath sounds.  Abdominal:     Palpations: Abdomen is soft.     Tenderness: There is no abdominal tenderness.  Musculoskeletal:        General: No swelling.     Cervical back: Neck supple.  Skin:    General: Skin is warm and dry.     Capillary Refill: Capillary refill takes less than 2 seconds.  Neurological:     Mental Status: He is alert.     Comments: Patient is alert and oriented. There is no abnormal phonation. Symmetric smile without facial droop. Moves all  extremities spontaneously. 5/5 RUE, 4/5 LUE, 5/5 BLE (baseline left sided deficits). No sensation deficit. There is no nystagmus. EOMI, PERRL. Coordination intact with finger to nose.    Psychiatric:  Mood and Affect: Mood normal.     (all labs ordered are listed, but only abnormal results are displayed) Labs Reviewed  COMPREHENSIVE METABOLIC PANEL WITH GFR - Abnormal; Notable for the following components:      Result Value   BUN 28 (*)    Creatinine, Ser 1.75 (*)    AST 11 (*)    Alkaline Phosphatase 162 (*)    GFR, Estimated 39 (*)    All other components within normal limits  CBC - Abnormal; Notable for the following components:   RBC 3.55 (*)    Hemoglobin 11.8 (*)    HCT 35.2 (*)    All other components within normal limits  URINALYSIS, ROUTINE W REFLEX MICROSCOPIC - Abnormal; Notable for the following components:   Hgb urine dipstick SMALL (*)    Protein, ur 30 (*)    Leukocytes,Ua TRACE (*)    All other components within normal limits  CBG MONITORING, ED    EKG: None  Radiology: CT ABDOMEN PELVIS W CONTRAST Result Date: 04/30/2024 CLINICAL DATA:  Acute abdominal pain EXAM: CT ABDOMEN AND PELVIS WITH CONTRAST TECHNIQUE: Multidetector CT imaging of the abdomen and pelvis was performed using the standard protocol following bolus administration of intravenous contrast. RADIATION DOSE REDUCTION: This exam was performed according to the departmental dose-optimization program which includes automated exposure control, adjustment of the mA and/or kV according to patient size and/or use of iterative reconstruction technique. CONTRAST:  OMNIPAQUE  IOHEXOL  300 MG/ML  SOLN COMPARISON:  CT abdomen and pelvis 01/17/2023 FINDINGS: Lower chest: There is some mild patchy ground-glass opacities in the right lung base, nonspecific. Hepatobiliary: No focal liver abnormality is seen. Status post cholecystectomy. No biliary dilatation. Pancreas: Unremarkable. No pancreatic ductal  dilatation or surrounding inflammatory changes. Spleen: Normal in size without focal abnormality. Adrenals/Urinary Tract: The bladder is completely decompressed and not well evaluated. There cysts in both kidneys, right greater than left. The largest cyst in the right kidney measures 6 cm. There are additional hypodensities in both kidneys which are too small to characterize. There is no hydronephrosis or perinephric fluid collection. The adrenal glands are within normal limits. Stomach/Bowel: Stomach is within normal limits. No evidence of bowel wall thickening, distention, or inflammatory changes. There is a large amount of stool throughout the entire colon. The rectum is dilated and stool-filled measuring up to 7.3 cm. There is mild presacral edema. The appendix is not seen. Vascular/Lymphatic: Aortic atherosclerosis. No enlarged abdominal or pelvic lymph nodes. Reproductive: The prostate gland is enlarged. TURP defect again noted. Other: No abdominal wall hernia or abnormality. No abdominopelvic ascites. Musculoskeletal: There are vertebroplasty changes at L1. The bones are diffusely osteopenic. IMPRESSION: 1. Large amount of stool throughout the entire colon. The rectum is dilated and stool-filled measuring up to 7.3 cm. There is mild presacral edema. Findings are compatible with fecal impaction. 2. Mild patchy ground-glass opacities in the right lung base, nonspecific. Findings may be infectious/inflammatory. 3. Bilateral renal cysts. No follow-up imaging necessary. 4. Aortic atherosclerosis. Aortic Atherosclerosis (ICD10-I70.0). Electronically Signed   By: Greig Pique M.D.   On: 04/30/2024 15:41   CT Head Wo Contrast Result Date: 04/30/2024 EXAM: CT HEAD WITHOUT CONTRAST 04/30/2024 02:37:54 PM TECHNIQUE: CT of the head was performed without the administration of intravenous contrast. Automated exposure control, iterative reconstruction, and/or weight based adjustment of the mA/kV was utilized to reduce  the radiation dose to as low as reasonably achievable. COMPARISON: CT head May 10, 2023 CLINICAL HISTORY: Mental status change,  unknown cause FINDINGS: BRAIN AND VENTRICLES: No acute hemorrhage. No evidence of acute infarct. Unchanged remote large right MCA territory infarcts. Unchanged moderate chronic small vessel ischemia with a chronic left basal ganglia infarct. No hydrocephalus. No extra-axial collection. No mass effect or midline shift. ORBITS: No acute abnormality. SINUSES: No acute abnormality. SOFT TISSUES AND SKULL: No acute soft tissue abnormality. No skull fracture. IMPRESSION: 1. No acute intracranial abnormality. 2. Unchanged remote infarcts and chronic microvascular ischemic change. Electronically signed by: Gilmore Molt MD 04/30/2024 02:58 PM EST RP Workstation: [id number]   DG Chest Portable 1 View Result Date: 04/30/2024 EXAM: 1 VIEW(S) XRAY OF THE CHEST 04/30/2024 01:34:00 PM COMPARISON: 09/10/2023 CLINICAL HISTORY: ams FINDINGS: LUNGS AND PLEURA: No focal pulmonary opacity. No pleural effusion. No pneumothorax. HEART AND MEDIASTINUM: Aortic atherosclerotic calcifications report. No acute abnormality of the cardiac and mediastinal silhouettes. BONES AND SOFT TISSUES: No acute osseous abnormality. IMPRESSION: 1. No acute cardiopulmonary process. Electronically signed by: Waddell Calk MD 04/30/2024 02:22 PM EST RP Workstation: [id number]     Procedures   Medications Ordered in the ED  sodium phosphate  (FLEET) enema 1 enema (has no administration in time range)  sodium chloride  0.9 % bolus 1,000 mL (0 mLs Intravenous Stopped 04/30/24 1546)  iohexol  (OMNIPAQUE ) 300 MG/ML solution 100 mL (100 mLs Intravenous Contrast Given 04/30/24 1428)    Clinical Course as of 04/30/24 1659  Mon Apr 30, 2024  1347 Patient with history of Parkinson's evaluated for complaints of altered mental status with associated abdominal pain and foul-smelling urine over the past several days.  Upon arrival  he is hemodynamically stable.  On exam his lung sounds are clear.  His abdomen is nontender.  He has left-sided baseline deficits from prior CVAs.  No new deficits appreciated on exam today.  Patient is alert.  Oriented. he does follow commands.  Will obtain routine labs and CT imaging. [JT]  1414 CBC(!) No leukocytosis, hemoglobin stable [JT]  1415 Comprehensive metabolic panel(!) Elevated creatinine and alk phos both at baseline [JT]  1423 Urinalysis, Routine w reflex microscopic -Urine, Clean Catch(!) Trace leukocytes with negative nitrates, 11-20 WBCs with no bacteria [JT]  1440 DG Chest Portable 1 View No acute cardiopulmonary disease [JT]  1517 CT Head Wo Contrast No acute abnormality [JT]  1546 CT ABDOMEN PELVIS W CONTRAST Large amount of stool throughout the entire colon with mild presacral edema.  Compatible with fecal impaction.  Mild patchy ground glass opacities in the right lung base.  Nonspecific.  No respiratory symptoms [JT]  1633 Attempt at disimpaction was made, however there is no significant hard stool.  Will perform Fleet enema here in the ED and discharged on bowel regiment and close PCP follow-up with strict return precautions.  Family at bedside are understanding agreement plan. [JT]    Clinical Course User Index [JT] Donnajean Lynwood DEL, PA-C                                 Medical Decision Making Amount and/or Complexity of Data Reviewed Labs: ordered. Radiology: ordered.   This patient presents to the ED with chief complaint(s) of AMS .  The complaint involves an extensive differential diagnosis and also carries with it a high risk of complications and morbidity.   Pertinent past medical history as listed in HPI  The differential diagnosis includes  CVA, TIA, UTI, pneumonia, acute abdominal pathology Additional history obtained: Additional history obtained from family Records  reviewed Care Everywhere/External Records  Disposition:   Patient will be  discharged home. The patient has been appropriately medically screened and/or stabilized in the ED. I have low suspicion for any other emergent medical condition which would require further screening, evaluation or treatment in the ED or require inpatient management. At time of discharge the patient is hemodynamically stable and in no acute distress. I have discussed work-up results and diagnosis with patient and answered all questions. Patient is agreeable with discharge plan. We discussed strict return precautions for returning to the emergency department and they verbalized understanding.     Social Determinants of Health:   none  This note was dictated with voice recognition software.  Despite best efforts at proofreading, errors may have occurred which can change the documentation meaning.       Final diagnoses:  Constipation, unspecified constipation type    ED Discharge Orders     None          Donnajean Lynwood DEL, PA-C 05/03/24 RONOLD Ruthe Cornet, DO 05/04/24 1628

## 2024-04-30 NOTE — Telephone Encounter (Signed)
 This RN left VM to call back to discuss symptoms. Attempt #1  Copied from CRM #[id number]. Topic: Clinical - Medical Advice >> Apr 30, 2024  8:32 AM Viola FALCON wrote: Patient spouse Devere called, says patient has possible bladder infection and has odor in his urine - requested appt today but there are none. Please call her at [phone]

## 2024-04-30 NOTE — Telephone Encounter (Signed)
 Patients wife is calling, he is not tolerating the 2 mg [name]. Patient is drowsy, off balance. She is wondering if this is from the [name]. Please review and advise, thank you

## 2024-04-30 NOTE — Telephone Encounter (Signed)
 Pt currently in the ED. Left VM with wife to get HFU when discharged

## 2024-04-30 NOTE — ED Triage Notes (Signed)
 Family states AMS, ABD pain, and strong urine smell x 3 days. Denies fevers.

## 2024-04-30 NOTE — ED Notes (Signed)
 Patient had large BM results

## 2024-04-30 NOTE — Discharge Instructions (Addendum)
 You were found to be severely constipated.  Please take the MiraLAX  and senna (Peri-Colace) daily until you start having bowel movements.  Please follow-up with your primary care doctor for further evaluation.  If experience any worsening symptoms please return to emergency room.

## 2024-05-03 ENCOUNTER — Ambulatory Visit: Payer: Self-pay

## 2024-05-03 ENCOUNTER — Ambulatory Visit: Admitting: Physical Therapy

## 2024-05-03 ENCOUNTER — Encounter: Payer: Self-pay | Admitting: Physical Therapy

## 2024-05-03 DIAGNOSIS — Z9181 History of falling: Secondary | ICD-10-CM

## 2024-05-03 DIAGNOSIS — I69354 Hemiplegia and hemiparesis following cerebral infarction affecting left non-dominant side: Secondary | ICD-10-CM

## 2024-05-03 DIAGNOSIS — M6281 Muscle weakness (generalized): Secondary | ICD-10-CM

## 2024-05-03 DIAGNOSIS — R296 Repeated falls: Secondary | ICD-10-CM

## 2024-05-03 DIAGNOSIS — R262 Difficulty in walking, not elsewhere classified: Secondary | ICD-10-CM

## 2024-05-03 DIAGNOSIS — R2689 Other abnormalities of gait and mobility: Secondary | ICD-10-CM

## 2024-05-03 DIAGNOSIS — M25512 Pain in left shoulder: Secondary | ICD-10-CM

## 2024-05-03 NOTE — Telephone Encounter (Signed)
 1st attempt to reach Devere, left voicemail with callback number.  Copied from CRM [id number]. Topic: Clinical - Medical Advice >> May 03, 2024  1:59 PM Viola F wrote: Reason for CRM: Patient was seen at [facility] at [facility] 04/30/24 and has been constipated. Please call Devere at [phone]

## 2024-05-03 NOTE — Telephone Encounter (Signed)
 3rd attempt to reach William [name], left message.

## 2024-05-03 NOTE — Telephone Encounter (Signed)
 2nd attempt to reach William [name], left message.

## 2024-05-03 NOTE — Therapy (Signed)
 OUTPATIENT PHYSICAL THERAPY NEURO TREATMENT & PROGRESS REPORT     Patient Name: William [name] MRN: [id number] DOB:[birth year], 83 y.o., male Today's Date: 05/03/2024  PCP: [name]  DO REFERRING PROVIDER: same   END OF SESSION:  PT End of Session - 05/03/24 1442     Visit Number 101    Date for Recertification  06/10/24    Authorization Type HTA    PT Start Time 1440    PT Stop Time 1520    PT Time Calculation (min) 40 min    Equipment Utilized During Treatment Gait belt    Activity Tolerance Patient tolerated treatment well    Behavior During Therapy WFL for tasks assessed/performed                  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   HTN (hypertension) 07/19/2015   Hyperlipidemia    Kidney disease, chronic, stage III (GFR 30-59 ml/min) (HCC)    Major neurocognitive disorder 01/09/2014   Mild, related to stroke history   Nocturia    Parkinson disease (HCC)    Renal insufficiency 06/25/2013   S/P carotid endarterectomy    BILATERAL ICA--  PATENT PER DUPLEX  05-19-2012   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/26/2017   Right calf   Urinary frequency    Vitamin D  deficiency    Past Surgical History:  Procedure Laterality Date   APPENDECTOMY  AS CHILD   CARDIOVASCULAR STRESS TEST  03-27-2012  DR CRENSHAW   LOW RISK LEXISCAN  STUDY-- PROBABLE NORMAL PERFUSION AND SOFT TISSUE ATTENUATION/  NO ISCHEMIA/ EF 51%   CAROTID ENDARTERECTOMY Bilateral LEFT  11-12-2008  DR GREG HAYES   RIGHT ICA  2006  ([facility])   CHOLECYSTECTOMY N/A  02/23/2022   Procedure: LAPAROSCOPIC CHOLECYSTECTOMY;  Surgeon: [name], MD;  Location: [facility];  Service: General;  Laterality: N/A;   CYSTOSCOPY W/ RETROGRADES Bilateral 06/22/2021   Procedure: CYSTOSCOPY WITH RETROGRADE PYELOGRAM;  Surgeon: [name], MD;  Location: [facility];  Service: Urology;  Laterality: Bilateral;   CYSTOSCOPY WITH LITHOLAPAXY N/A 02/26/2013   Procedure: CYSTOSCOPY WITH LITHOLAPAXY;  Surgeon: Senior Matilda, MD;  Location: [facility];  Service: Urology;  Laterality: N/A;   ENDOSCOPIC RETROGRADE CHOLANGIOPANCREATOGRAPHY (ERCP) WITH PROPOFOL  N/A 02/22/2022   Procedure: ENDOSCOPIC RETROGRADE CHOLANGIOPANCREATOGRAPHY (ERCP) WITH PROPOFOL ;  Surgeon: [name], MD;  Location: [name] ENDOSCOPY;  Service: Gastroenterology;  Laterality: N/A;   EYE SURGERY  Jan. 2016   cataract surgery both eyes   INGUINAL HERNIA REPAIR Right 11-08-2006   IR KYPHO EA ADDL LEVEL THORACIC OR LUMBAR  02/12/2021   IR RADIOLOGIST EVAL & MGMT  02/18/2021   MASS EXCISION N/A 03/03/2016   Procedure: EXCISION OF BACK  MASS;  Surgeon: [name], MD;  Location: [facility];  Service: General;  Laterality: N/A;   MOHS SURGERY Left 1/ 2016   [name]-- Basal cell   PROSTATE SURGERY     REMOVAL OF STONES  02/22/2022   Procedure: REMOVAL OF STONES;  Surgeon: [name], MD;  Location: [facility] ENDOSCOPY;  Service: Gastroenterology;;   [name]  02/22/2022   Procedure: [name];  Surgeon: [name], MD;  Location: [facility] ENDOSCOPY;  Service: Gastroenterology;;   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 N/A 06/22/2021   Procedure: TRANSURETHRAL RESECTION OF BLADDER TUMOR;  Surgeon: [name], MD;  Location: [facility];  Service: Urology;  Laterality: N/A;   TRANSURETHRAL RESECTION OF PROSTATE N/A 02/26/2013   Procedure: TRANSURETHRAL RESECTION OF THE PROSTATE WITH GYRUS INSTRUMENTS;  Surgeon: Senior Matilda, MD;  Location: [facility];  Service: Urology;  Laterality: N/A;   TRANSURETHRAL RESECTION OF PROSTATE N/A 06/22/2021   Procedure: TRANSURETHRAL RESECTION OF THE PROSTATE (TURP);  Surgeon: [name], MD;  Location: [facility];  Service: Urology;  Laterality: N/A;   Patient Active Problem List   Diagnosis Date Noted   Bronchitis 06/16/2023   Impacted cerumen of left ear 06/16/2023   Acute encephalopathy 04/18/2023   Parkinson's disease (HCC) 04/18/2023   History of CVA with residual deficit 04/18/2023   Chronic kidney disease, stage 3b (HCC) 04/18/2023   Dementia with behavioral disturbance (HCC) 04/18/2023   Anemia of chronic disease 04/18/2023   Laceration of right upper extremity 02/24/2023   Dermatitis 02/24/2023   Lung nodules 02/24/2023   Dysuria 08/03/2022   Nonintractable headache 07/01/2022   Bilateral impacted cerumen 06/24/2022   Rash 06/15/2022   Postoperative ileus (HCC) 02/27/2022   Ileus, postoperative (HCC) 02/26/2022   Choledocholithiasis 02/19/2022   DNR (do not resuscitate) 02/19/2022   Left elbow pain 01/26/2022   Mid back pain on left side 01/26/2022   Rib pain 01/26/2022   Acute pain of left shoulder 11/12/2021   Leukocytes in urine 11/12/2021   Urinary frequency 11/12/2021   Thrush 10/08/2021   Hemiplegia, 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   Insomnia    Prediabetes    Acute 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 (HCC)    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   Transaminitis 07/27/2021   UTI (urinary tract infection) 07/27/2021   CVA (cerebral vascular accident) (HCC) 07/27/2021   Fall 07/27/2021   Hyperglycemia 07/27/2021   Cholelithiasis 07/27/2021   Hypoxia 07/27/2021   Nausea and vomiting 07/27/2021   Acute metabolic encephalopathy 07/27/2021   Normocytic anemia 07/27/2021   Chronic back pain 07/27/2021   Malignant neoplasm of overlapping  sites of bladder (HCC) 06/22/2021   Closed fracture of first lumbar vertebra with routine healing 02/03/2021   Closed fracture of multiple ribs 11/18/2020   Anxiety 01/29/2020   Leg pain, bilateral 01/29/2020   Ingrown toenail 07/13/2019   Lumbar spondylosis 05/02/2018   Pain in left knee 03/09/2018   Osteoarthritis of left hip 01/16/2018   Trochanteric bursitis of left hip 01/16/2018   Preventative health care 09/26/2017   HTN (hypertension) 07/19/2015   Hyperlipidemia 07/19/2015   Great toe pain 02/11/2014   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   Obesity (BMI 30-39.9) 06/25/2013   Renal insufficiency 06/25/2013   Weakness of left arm 06/25/2013   Sebaceous cyst 03/03/2011   Sprain of lumbar region 07/31/2010   Rib pain, left 08/29/2009   Carotid artery stenosis, asymptomatic, bilateral 05/02/2009   Eczema, atopic 05/31/2008   Vitamin D  deficiency 03/01/2008   BPH (benign prostatic hyperplasia) 08/06/2007   Fasting hyperglycemia 12/21/2006   History of right MCA infarct 2006    ONSET DATE: 06/29/22 REFERRING DIAG:  Diagnosis  I63.9 (ICD-10-CM) - Cerebrovascular accident (CVA), unspecified mechanism (HCC)  W19.XXXD (ICD-10-CM) - Fall, subsequent encounter    THERAPY DIAG:  Muscle weakness (generalized)  Other abnormalities of gait and mobility  Hemiplegia and hemiparesis following cerebral infarction affecting left non-dominant side (HCC)  Difficulty in walking, not elsewhere classified  Repeated falls  Acute pain of left shoulder  History of falling  Rationale for Evaluation and Treatment: Rehabilitation  SUBJECTIVE:                                                                                                                                                                                         SUBJECTIVE STATEMENT:  Spouse reports that he had to go to the ED on Monday, he was impacted, reports that he is very weak and not walking well today, no  falls, reports difficulty getting out of the car and getting him to the bathroom  PERTINENT HISTORY:  William [name] is a 83 year old man with dementia, CKD, HTN, CAD, HLD and history of CVA  (2006, 2023), Parkinson's  PAIN:  Are you having pain? Faces/behavior scale- mild pain BLEs  and back   PRECAUTIONS: Fall  WEIGHT BEARING RESTRICTIONS: No  FALLS: Has patient fallen in last 6 months? No  LIVING ENVIRONMENT: Lives with: lives with their family and lives with their spouse Lives in: House/apartment Stairs: 1 brick high step Has following equipment at home: Environmental Consultant - 2 wheeled, Wheelchair (manual), Shower bench, bed side commode, Grab bars, and hospital bed, sliding pad for car  PLOF: Independent with basic ADLs and Independent with household mobility without device  PATIENT GOALS: Walk around the block with his wife. Be able to use the dining room chair rather than have to use the W/C. Increased I with bed mobility and simple tasks at home like make some coffee or brush his teeth, Step over tub to shower, has bench, but he can't really use it. Up and down the one step to get to back deck. 11/23/22: Patient's wife Devere updated his goals today: Walk around store (gets distracted), swing a golf club again (thinking of driving range), get up on his own, walk without leaning forward, more recognition of the left side (from CVA), not need gait belt, OT-- be able to get dressed more on his own, navigate the newspaper.  OBJECTIVE:   TREATMENT: 05/03/24 Nustep level 4 x 6 minutes Bike level 4 x 5 minutes Gait outside with HHA and gait belt 1/2 parking [place] and then rest, then walk in the grass around the side of the building to the front UBE level 3 x 4 minutes LAQ  Marches Standing ball toss and bounce Gait with HHA x 200 feet and then out to the car  04/26/24 Tug 15 seconds Gait outside around 1/2 parking [place] and then through grass to the front door Partial sit ups Cone  reaching with trunk rotation and elongation and to the floor, PT call out color and he would reach out and or kick Standing fall catch, bounce or kick fairly rapidly Bike 5 minutes  04/17/24 Nustep level 5 x 5 minutes Bike level 4 x 5 minutes Gait outside with HHA up and down curbs, slopes all the way around the back building a rest at a table in the back, up the hill a standing rest and then to the table int he island to rest again, really struggled coming up the hill. Gait out to the car  04/10/24 Nustep lvl 5 for 6 min LE SciFit lvl 4 for 5 min UBE Bike lvl 3 2 min fwd, 2 min bkwd Ambulating around the clinic to find cones for cardiovascular endurance, functional mobility, and visual scanning.  Car and toilet transfers  04/05/24 Nustep level 5 x 6 minutes STM to the left upper trap Bike level 4 x 6 minutes Standing reaching for numbers called out up to a sequence of 4 Standing ball bouncing Gait outside around parking [place], rest and then around to the front, then 200 feet inside and out to car  03/27/24 Nustep level 5 x 6 minutes Bike level 5 x 5 minutes STM with the tgun to the left upper trap area Passive ROM to the left shoulder all motions to his tolerance UBE level 4 x 4 minutes Gait outside with HHA 1/2 the parking [place] and then rest and then around to the front of the building on the smooth surface Seated left LAQ, marches Standing ball bouncing Standing HHA side stepping and backward walking  03/20/24 Nustep level 5 x 6 minutes Bike level 4 x 5 minutes UBE level 3 x 4 minutes Gait outside 1/2 around the parking island rest and then through grass back to the front of the building some cues for step length Seated partial sit ups, worked on some trunk mobility trying to get him to lean side to side.  Ball bouncing in sitting and in standing.  Side stepping, gait 2 laps with HHA PROM of the left shoulder  03/15/2024 Nustep level 5 x 5 minutes Bike level 4 x 5  minutes Dribbling ball Partial sit ups Ball kicks Walked out side with HHA 1/2 way around parking [place] and then rest and then from there to the front door through some deep grass, this was very difficult for him, needed a lot of cues to take bigger steps and needed more assist, once we did this he had a hard time getting his feet and steps back. Did 240 feet HHA walking in the clinic and then to the car after this to get the stepping pattern back  03/08/24 NuStep L5x  UBE L3 x27mins each way- hard time keeping hand on bar today AB isometric 2x10 Ball squeezes 2x10 Standing ball dribbling Kicking different color cones Dribbling ball back and forth with wife   02/28/24 NuStep L5x77mins UBE L2 x55mins each way  Seated PWR moves- holding big red ball touching floor and reaching overhead, rotations holding big red ball Punches with 2# weights  LAQ 5# 2x12 Modified sit ups 2x10  02/21/24 Nustep level 5 x 9 minutes UBE level 3 x 5 minutes Worked with wife  on the lift from floor chair, is this feasible and possible for Octaviano to use.  I worked on trying to simulate this, with him having to sit up from supine with him going to long sitting.  Difficult for him due to tight HS, then can he push himself up onto a small seating surface, again very difficulty for him as he was falling backward Passive HS stretches Seated PWR moves and instructed his wife with these Gait out back door through the grass to the car  02/14/24 Nustep level 5 x 6 minutes Bike level 4 x 5 minutes UBE level 4 x 5 minutes STM to the left shoulder area mainly upper trap Some genlte PROM of the left shoulder Gait with HHA out the back door around one of the islands on the side of the back building back up the hill to the picnic table and rest and then through the grass on the side of the building to the front door, this was very fatiguing for him, a lot of cues verbal and physical to get him to stand up Gait out to the  car  PATIENT EDUCATION: Education details: none new 02/16/23 Person educated: Patient and Spouse Education method: Explanation, Demonstration, Tactile cues, and Verbal cues Education comprehension: verbalized understanding, returned demonstration, verbal cues required, tactile cues required, and needs further education   ------------------------------------------------------------------------------------------------------- (Measures in this section from initial evaluation unless otherwise noted) DIAGNOSTIC FINDINGS:  MRI 2021 with degenerative changes in lumbar spine, L3-4 R subarticular stenosis COGNITION: Overall cognitive status: History of cognitive impairments - at baseline SENSATION: Not tested COORDINATION: Moderately impaired BLE MUSCLE LENGTH: Hamstrings: severely restricted B Thomas test: Severely restricted B. POSTURE: rounded shoulders, forward head, increased thoracic kyphosis, posterior pelvic tilt, and flexed trunk  LOWER EXTREMITY ROM:   BLE extremely stiff throughout, limited hip ROM in all planes, B knees limited in extension. LOWER EXTREMITY MMT:  3+/5 throughout. Unable to determine accuracy of MMT due to cognition. Functionally demosntrates poor coordinated activation and poor muscular endurance. BED MOBILITY: Min A for Supine<> Sit. Patient was using a ladder in his hospital bed and could move MI. TRANSFERS: Min A, mod VC, tends to lean back.  11/02/22 independent with use of hands on arm chair with a couple of tries CURB: Min A-Has one step out to his deck. STAIRS: N/A  GAIT: Gait pattern: step to pattern, decreased arm swing- Right, decreased arm swing- Left, decreased step length- Right, decreased step length- Left, shuffling, festinating, trunk flexed, narrow BOS, poor foot clearance- Right, and poor foot clearance- Left Distance walked: 46' Assistive device utilized: Environmental Consultant - 2 wheeled Level of assistance: Occasional min A Comments: mod VC to stay close to  walker, take longer steps. FUNCTIONAL TESTS:  5 times sit to stand: 38.23  09/29/22  could not performs 5XSTS, 11/02/22 = 47 seconds with cues to use arms, 07/21/23 really struggled today 55 seconds Timed up and go (TUG): 47.91 with RW, 07/21/22 TUG 29  seconds without device, 09/29/22 25 seconds, 11/02/22 TUG 20 seconds, 07/21/23 = 26 seconds no device SBA                                                                     PATIENT EDUCATION: Education details:  Progress towards goals, POC Person educated: Patient and Spouse Education method: Explanation, Demonstration, and Verbal cues  Education comprehension: verbalized understanding, returned demonstration, verbal cues required, and needs further education  HOME EXERCISE PROGRAM: PWR! moves  GOALS:  SHORT TERM GOALS: Target date: 01/25/24   Pt will perform HEP to maintain lower body strength, flexibility, posture with wife's assistance. Baseline:  Goal status: met 04/12/23  2. Pt will perform sit to stand, 8 of 10 reps with BUE support, with supervision, to maintain safety with transfers.  Baseline: min guard  Goal status: 02/07/23-CGA from elevated surface and max VC to lean forward, ongoing  continues to need a lot of cues for hand placement and to lean forward, tends to lean backward, needs cues and some assistance 02/28/24; 04/11/24 - requires CGA-MinA with Sit>Stand, can transfer with SBA and VC's 04/26/24  LONG TERM GOALS: Target date: 04/10/24   Patient will perform HEP progression to maintain lower body strength, flexibility, posture with wife's assistance. Baseline:  Goal status: has been able to walk with wife multiple times a day 03/27/24; pt states compliance to HEP and walking to and from the mailbox w wife every day 04/11/24  2.  Pt will ambulate 150-175 ft in 2 MWT with min guard assist, to preserve functional mobility within the home.  Baseline: Goal status:  met 08/25/23  3. Wife will verbalize tips for fall prevention  education to minimize fall frequency Baseline:  Goal status:progressing and doing well 4.  Patient will be able to toilet without freezing for over a 4 week period met 11/15/23  5.  Patient will be able to get out of bed from supine with wife assist minimal Assist,met 10/06/23   ASSESSMENT:  CLINICAL IMPRESSION:  Patient went to the ED due to impaction, wife reports that he is really tired and has not been walking well lately  He did well but was less verbal and when verbal I could not hear him speaking.  He denied left shoulder pain . Demos mild inattention to the L side d/t previous CVA potentially. CGA-MinA with balance. He seems to be having more difficulty swallowing and with verbalizing.  OBJECTIVE IMPAIRMENTS: Abnormal gait, decreased activity tolerance, decreased balance, decreased cognition, decreased coordination, decreased endurance, decreased mobility, difficulty walking, decreased ROM, decreased strength, decreased safety awareness, impaired flexibility, impaired UE functional use, improper body mechanics, and postural dysfunction.   PLAN:  PT FREQUENCY: 1x/week  PT DURATION: 12 weeks  PLANNED INTERVENTIONS: Therapeutic exercises, Therapeutic activity, Neuromuscular re-education, Balance training, Gait training, Patient/Family education, Self Care, Joint mobilization, Stair training, Moist heat, and Manual therapy  PLAN FOR NEXT SESSION:   Will continue to work with patient to help him and his wife stay living at home and with less difficulty   Patient Details  Name: JUSTINE COSSIN MRN: [id number] Date of Birth: 1941-04-25 Referring Provider:  [name], *  Ozell Mainland, PT

## 2024-05-07 ENCOUNTER — Other Ambulatory Visit: Payer: Self-pay

## 2024-05-07 ENCOUNTER — Other Ambulatory Visit (HOSPITAL_BASED_OUTPATIENT_CLINIC_OR_DEPARTMENT_OTHER): Payer: Self-pay

## 2024-05-08 ENCOUNTER — Ambulatory Visit: Admitting: Family Medicine

## 2024-05-08 ENCOUNTER — Ambulatory Visit

## 2024-05-08 ENCOUNTER — Other Ambulatory Visit (HOSPITAL_BASED_OUTPATIENT_CLINIC_OR_DEPARTMENT_OTHER): Payer: Self-pay

## 2024-05-08 VITALS — BP 142/68 | HR 72 | Resp 18 | Ht 71.0 in | Wt 140.0 lb

## 2024-05-08 DIAGNOSIS — M6281 Muscle weakness (generalized): Secondary | ICD-10-CM

## 2024-05-08 DIAGNOSIS — I69354 Hemiplegia and hemiparesis following cerebral infarction affecting left non-dominant side: Secondary | ICD-10-CM

## 2024-05-08 DIAGNOSIS — Z8719 Personal history of other diseases of the digestive system: Secondary | ICD-10-CM | POA: Insufficient documentation

## 2024-05-08 DIAGNOSIS — R296 Repeated falls: Secondary | ICD-10-CM

## 2024-05-08 DIAGNOSIS — H6122 Impacted cerumen, left ear: Secondary | ICD-10-CM | POA: Diagnosis not present

## 2024-05-08 DIAGNOSIS — R2689 Other abnormalities of gait and mobility: Secondary | ICD-10-CM

## 2024-05-08 DIAGNOSIS — R262 Difficulty in walking, not elsewhere classified: Secondary | ICD-10-CM

## 2024-05-08 NOTE — Progress Notes (Signed)
 Subjective:    Patient ID: William [name], male    DOB: Dec 15, 1940, 83 y.o.   MRN: [id number]  Chief Complaint  Patient presents with   Hospitalization Follow-up    HPI Patient is in today for er f/u.  Discussed the use of AI scribe software for clinical note transcription with the patient, who gave verbal consent to proceed.  History of Present Illness William [name] is an 83 year old male who presents with constipation and recent bowel obstruction.  He has been experiencing constipation, with a significant episode of not having a bowel movement for approximately two weeks. An emergency room visit was made due to concerns about constipation and possible dehydration, and it was found that he was full of stool, with liquid stool passing around the retained stool. An enema was administered, resulting in the passage of a large stool, and he has since had several large, soft bowel movements.  He is currently taking Miralax  and has been given Senna tablets to aid bowel movements. His caregiver is concerned about the long-term use of Senna, as the instructions advise against use for more than seven days. There is also concern about dehydration due to frequent bowel movements, as he has had issues with dehydration in the past.  He was previously prescribed [name] , which may have contributed to his constipation. While on [name] , he experienced forgetfulness and difficulty walking, with shuffling of his feet. His caregiver is in the process of weaning him off [name]  due to these side effects.  His appetite has decreased recently, prompting his caregiver to purchase Ensure to supplement his nutrition. However, he does not like the taste of the chocolate flavor, and alternatives are being considered.    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   HTN (hypertension) 07/19/2015   Hyperlipidemia    Kidney disease, chronic, stage III (GFR 30-59 ml/min) (HCC)    Major neurocognitive disorder 01/09/2014   Mild, related to stroke history   Nocturia    Parkinson disease (HCC)    Renal insufficiency 06/25/2013   S/P carotid endarterectomy    BILATERAL ICA--  PATENT PER DUPLEX  05-19-2012   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   Right calf   Urinary frequency    Vitamin D  deficiency     Past Surgical History:  Procedure Laterality Date   APPENDECTOMY  AS CHILD   CARDIOVASCULAR STRESS TEST  03-27-2012  DR CRENSHAW   LOW RISK LEXISCAN  STUDY-- PROBABLE NORMAL PERFUSION AND SOFT TISSUE ATTENUATION/  NO ISCHEMIA/ EF 51%   CAROTID ENDARTERECTOMY Bilateral LEFT  11-12-2008  DR GREG HAYES   RIGHT ICA  2006  ([facility])   CHOLECYSTECTOMY N/A 02/23/2022   Procedure: LAPAROSCOPIC CHOLECYSTECTOMY;  Surgeon: [name], MD;  Location: [facility] OR;  Service: General;  Laterality: N/A;   CYSTOSCOPY W/ RETROGRADES Bilateral 06/22/2021   Procedure: CYSTOSCOPY WITH RETROGRADE PYELOGRAM;  Surgeon: [name], MD;  Location: [facility];  Service: Urology;  Laterality: Bilateral;  CYSTOSCOPY WITH LITHOLAPAXY N/A 02/26/2013   Procedure: CYSTOSCOPY WITH LITHOLAPAXY;  Surgeon: Garnette Shack, MD;  Location: [facility];  Service: Urology;  Laterality: N/A;   ENDOSCOPIC RETROGRADE CHOLANGIOPANCREATOGRAPHY (ERCP) WITH PROPOFOL  N/A 02/22/2022   Procedure: ENDOSCOPIC RETROGRADE CHOLANGIOPANCREATOGRAPHY (ERCP) WITH PROPOFOL ;  Surgeon: [name], MD;  Location: [facility] ENDOSCOPY;  Service: Gastroenterology;  Laterality: N/A;   EYE SURGERY  Jan.  2016   cataract surgery both eyes   INGUINAL HERNIA REPAIR Right 11-08-2006   IR KYPHO EA ADDL LEVEL THORACIC OR LUMBAR  02/12/2021   IR RADIOLOGIST EVAL & MGMT  02/18/2021   MASS EXCISION N/A 03/03/2016   Procedure: EXCISION OF BACK  MASS;  Surgeon: [name], MD;  Location: [facility];  Service: General;  Laterality: N/A;   MOHS SURGERY Left 1/ 2016   [name]-- Basal cell   PROSTATE SURGERY     REMOVAL OF STONES  02/22/2022   Procedure: REMOVAL OF STONES;  Surgeon: [name], MD;  Location: [facility] ENDOSCOPY;  Service: Gastroenterology;;   [name]  02/22/2022   Procedure: [name];  Surgeon: [name], MD;  Location: [facility] ENDOSCOPY;  Service: Gastroenterology;;   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 N/A 06/22/2021   Procedure: TRANSURETHRAL RESECTION OF BLADDER TUMOR;  Surgeon: Shack Garnette, MD;  Location: [facility];  Service: Urology;  Laterality: N/A;   TRANSURETHRAL RESECTION OF PROSTATE N/A 02/26/2013   Procedure: TRANSURETHRAL RESECTION OF THE PROSTATE WITH GYRUS INSTRUMENTS;  Surgeon: Garnette Shack, MD;  Location: [facility];  Service: Urology;  Laterality: N/A;   TRANSURETHRAL RESECTION OF PROSTATE N/A 06/22/2021   Procedure: TRANSURETHRAL RESECTION OF THE PROSTATE (TURP);  Surgeon: Shack Garnette, MD;  Location: [facility];  Service: Urology;  Laterality: N/A;    Family History  Problem Relation Age of Onset   Heart disease Mother        CHF   Bipolar disorder Mother    Heart disease Father        CHF    Social History   Socioeconomic History   Marital status: Married    Spouse name: Not on file   Number of children: 2   Years of education: 12   Highest education level: Associate degree: occupational, scientist, product/process development, or vocational program  Occupational History    Employer: Retired  Tobacco Use   Smoking status: Former    Current packs/day: 0.00    Average packs/day:  2.0 packs/day for 40.0 years (80.0 ttl pk-yrs)    Types: Cigarettes    Start date: 02/15/1965    Quit date: 02/15/2005    Years since quitting: 19.2   Smokeless tobacco: Never  Vaping Use   Vaping status: Never Used  Substance and Sexual Activity   Alcohol use: Not Currently   Drug use: No   Sexual activity: Not Currently    Partners: Female  Other Topics Concern   Not on file  Social History Narrative   Exercise--  Walks with assistance -- using wheelchair more than the walker       LIves with wife , no stairs in home, caffeine - one cup coffee day, exercise - not much, Right handed, 12th grade, retired      One story home   Social Drivers of Health   Financial Resource Strain: Low Risk  (05/04/2024)   Overall Financial Resource Strain (CARDIA)    Difficulty of Paying Living Expenses: Not very hard  Food Insecurity: No Food Insecurity (05/04/2024)  Hunger Vital Sign    Worried About Running Out of Food in the Last Year: Never true    Ran Out of Food in the Last Year: Never true  Transportation Needs: Unknown (05/04/2024)   PRAPARE - Transportation    Lack of Transportation (Medical): No    Lack of Transportation (Non-Medical): Patient declined  Physical Activity: Unknown (05/04/2024)   Exercise Vital Sign    Days of Exercise per Week: Patient declined    Minutes of Exercise per Session: Not on file  Stress: Stress Concern Present (05/04/2024)   [employer] of Occupational Health - Occupational Stress Questionnaire    Feeling of Stress: Rather much  Social Connections: Moderately Isolated (05/04/2024)   Social Connection and Isolation Panel    Frequency of Communication with Friends and Family: Three times a week    Frequency of Social Gatherings with Friends and Family: Never    Attends Religious Services: Never    Database Administrator or Organizations: No    Attends Engineer, Structural: Not on file    Marital Status: Married  Catering Manager  Violence: Not At Risk (04/19/2023)   Humiliation, Afraid, Rape, and Kick questionnaire    Fear of Current or Ex-Partner: No    Emotionally Abused: No    Physically Abused: No    Sexually Abused: No    Outpatient Medications Prior to Visit  Medication Sig Dispense Refill   acetaminophen  (TYLENOL ) 325 MG tablet Take 2 tablets (650 mg total) by mouth every 6 (six) hours as needed for mild pain.     amantadine  (SYMMETREL ) 100 MG capsule Take 1 capsule (100 mg total) by mouth daily. 90 capsule 1   amLODipine  (NORVASC ) 10 MG tablet Take 1 tablet (10 mg total) by mouth every morning. 90 tablet 1   bacitracin  ointment Apply 1 Application topically 2 (two) times daily. 120 g 0   brexpiprazole  ([name] ) 2 MG TABS tablet Take 1 tablet (2 mg total) by mouth at bedtime. 30 tablet 0   carBAMazepine  (TEGRETOL ) 100 MG/5ML suspension Take 5 mLs (100 mg total) by mouth in the morning AND 5 mLs (100 mg total) daily at 2 PM AND 10 mLs (200 mg total) at bedtime. 600 mL 5   carbidopa -levodopa  (SINEMET  IR) 25-100 MG tablet Take 1.5 tablets by mouth 3 (three) times daily. 165 tablet 5   cephALEXin  (KEFLEX ) 500 MG capsule Take 1 capsule (500 mg total) by mouth 2 (two) times daily. 20 capsule 0   Cholecalciferol  (VITAMIN D3) 50 MCG (2000 UT) CHEW Chew 1 tablet by mouth daily.     clobetasol  cream (TEMOVATE ) 0.05 % Apply 1 Application to affected areas topically up to 2 (two) times daily as needed. Do not apply to face, groin, or under arms 60 g 3   clopidogrel  (PLAVIX ) 75 MG tablet Take 1 tablet (75 mg total) by mouth daily. 90 tablet 1   cyanocobalamin  (VITAMIN B12) 1000 MCG tablet Take 1 tablet (1,000 mcg total) by mouth daily. 90 tablet 1   EPINEPHrine  (EPIPEN  2-PAK) 0.3 mg/0.3 mL IJ SOAJ injection Inject 0.3 mg into the muscle as needed for anaphylaxis. 2 each 1   ferrous sulfate  325 (65 FE) MG EC tablet Take 325 mg by mouth 2 (two) times daily. *Crushed*     fluticasone  (CUTIVATE ) 0.05 % cream 1 application  at  bedtime as needed (psoriasis).  3   glycerin  adult 2 g suppository Place 1 suppository rectally as needed for constipation. 12 suppository 0  hydrALAZINE  (APRESOLINE ) 25 MG tablet Take 1 tablet (25 mg total) by mouth 3 (three) times daily. 270 tablet 1   LORazepam  (ATIVAN ) 1 MG tablet Take 1/4 tablet by mouth in the morning, 1/2 tablet by mouth at lunch and 1 tablet by mouth at bedtime 60 tablet 2   LORazepam  (ATIVAN ) 1 MG tablet Take 1/4th (0.25 mg) tablet in the morning, 1/2 (half) tablet at 2pm and 1 (1mg ) tablet by mouth at bedtime. 60 tablet 4   LORazepam  (ATIVAN ) 1 MG tablet Take 1/4 tablet (0.25 mg total) by mouth in the morning AND 1/2 tablet (0.5 mg total) daily in the afternoon AND 1 tablet (1 mg total) at bedtime. 60 tablet 4   magnesium  hydroxide (MILK OF MAGNESIA) 400 MG/5ML suspension Take 60 mLs by mouth daily as needed for moderate constipation. 355 mL 0   Multiple Vitamins-Minerals (ADULT ONE DAILY GUMMIES) CHEW Chew 1 tablet by mouth every morning.     NONFORMULARY OR COMPOUNDED ITEM Transport chair  dx parkinsons, hx falls 1 each 0   pantoprazole  (PROTONIX ) 40 MG tablet Take 1 tablet (40 mg total) by mouth daily. 90 tablet 1   polyethylene glycol (MIRALAX  / GLYCOLAX ) 17 g packet Take 17 g by mouth daily. (Patient taking differently: Take 17 g by mouth every other day.) 14 each 0   tamsulosin  (FLOMAX ) 0.4 MG CAPS capsule Take 1 capsule (0.4 mg total) by mouth daily after supper. (Patient taking differently: Take 0.4 mg by mouth at bedtime.) 90 capsule 1   tamsulosin  (FLOMAX ) 0.4 MG CAPS capsule Take 1 capsule (0.4 mg total) by mouth at bedtime. 90 capsule 3   traZODone  (DESYREL ) 150 MG tablet Take 0.5 tablets (75 mg total) by mouth at bedtime. *Crushed* 20 tablet 6   triamcinolone  cream (KENALOG ) 0.1 % Apply 1 application topically 2 (two) times daily. 30 g 0   Facility-Administered Medications Prior to Visit  Medication Dose Route Frequency Provider Last Rate Last Admin    gemcitabine  (GEMZAR ) chemo syringe for bladder instillation 2,000 mg  2,000 mg Bladder Instillation Once Dahlstedt, Stephen, MD        Allergies  Allergen Reactions   Bee Venom Anaphylaxis   Strawberry Extract Hives   Latex Itching   Zetia  [Ezetimibe ] Other (See Comments)    Intolerance    Adhesive [Tape] Other (See Comments)    blisters   Statins Other (See Comments)    myalgias    Review of Systems  Constitutional:  Negative for chills, fever and malaise/fatigue.  HENT:  Negative for congestion and hearing loss.   Eyes:  Negative for blurred vision and discharge.  Respiratory:  Negative for cough, sputum production and shortness of breath.   Cardiovascular:  Negative for chest pain, palpitations and leg swelling.  Gastrointestinal:  Negative for abdominal pain, blood in stool, constipation, diarrhea, heartburn, nausea and vomiting.  Genitourinary:  Negative for dysuria, frequency, hematuria and urgency.  Musculoskeletal:  Negative for back pain, falls and myalgias.  Skin:  Negative for rash.  Neurological:  Negative for dizziness, sensory change, loss of consciousness, weakness and headaches.  Endo/Heme/Allergies:  Negative for environmental allergies. Does not bruise/bleed easily.  Psychiatric/Behavioral:  Negative for depression and suicidal ideas. The patient is not nervous/anxious and does not have insomnia.        Objective:    Physical Exam Vitals and nursing note reviewed.  Constitutional:      General: He is not in acute distress.    Appearance: Normal appearance. He is  well-developed.  HENT:     Head: Normocephalic and atraumatic.     Right Ear: Tympanic membrane, ear canal and external ear normal.     Left Ear: There is impacted cerumen. Tympanic membrane is not injected, scarred, perforated, erythematous, retracted or bulging. Tympanic membrane has normal mobility.     Ears:     Comments: L ear ---  + impaction L ear--- ear irrigated successfully Unable to  use hoop Eyes:     General: No scleral icterus.       Right eye: No discharge.        Left eye: No discharge.  Cardiovascular:     Rate and Rhythm: Normal rate and regular rhythm.     Heart sounds: No murmur heard. Pulmonary:     Effort: Pulmonary effort is normal. No respiratory distress.     Breath sounds: Normal breath sounds.  Musculoskeletal:        General: Normal range of motion.     Cervical back: Normal range of motion and neck supple.     Right lower leg: No edema.     Left lower leg: No edema.  Skin:    General: Skin is warm and dry.  Neurological:     Mental Status: He is alert and oriented to person, place, and time.  Psychiatric:        Mood and Affect: Mood normal.        Behavior: Behavior normal.        Thought Content: Thought content normal.        Judgment: Judgment normal.     BP (!) 142/68   Pulse 72   Resp 18   Ht 5' 11 (1.803 m)   Wt 140 lb (63.5 kg)   SpO2 98%   BMI 19.53 kg/m  Wt Readings from Last 3 Encounters:  05/08/24 140 lb (63.5 kg)  03/22/24 146 lb 6.4 oz (66.4 kg)  12/20/23 146 lb 6.4 oz (66.4 kg)    Diabetic Foot Exam - Simple   No data filed    Lab Results  Component Value Date   WBC 6.2 04/30/2024   HGB 11.8 (L) 04/30/2024   HCT 35.2 (L) 04/30/2024   PLT 154 04/30/2024   GLUCOSE 93 04/30/2024   CHOL 182 12/20/2023   TRIG 65.0 12/20/2023   HDL 57.40 12/20/2023   LDLCALC 112 (H) 12/20/2023   ALT <5 04/30/2024   AST 11 (L) 04/30/2024   NA 140 04/30/2024   K 4.4 04/30/2024   CL 104 04/30/2024   CREATININE 1.75 (H) 04/30/2024   BUN 28 (H) 04/30/2024   CO2 28 04/30/2024   TSH 0.94 12/20/2023   PSA 1.75 06/15/2022   INR 1.0 04/18/2023   HGBA1C 4.9 04/18/2023    Lab Results  Component Value Date   TSH 0.94 12/20/2023   Lab Results  Component Value Date   WBC 6.2 04/30/2024   HGB 11.8 (L) 04/30/2024   HCT 35.2 (L) 04/30/2024   MCV 99.2 04/30/2024   PLT 154 04/30/2024   Lab Results  Component Value Date    NA 140 04/30/2024   K 4.4 04/30/2024   CO2 28 04/30/2024   GLUCOSE 93 04/30/2024   BUN 28 (H) 04/30/2024   CREATININE 1.75 (H) 04/30/2024   BILITOT 0.3 04/30/2024   ALKPHOS 162 (H) 04/30/2024   AST 11 (L) 04/30/2024   ALT <5 04/30/2024   PROT 7.3 04/30/2024   ALBUMIN 3.9 04/30/2024   CALCIUM  9.3 04/30/2024  ANIONGAP 8 04/30/2024   EGFR 49 05/18/2022   GFR 36.09 (L) 12/20/2023   Lab Results  Component Value Date   CHOL 182 12/20/2023   Lab Results  Component Value Date   HDL 57.40 12/20/2023   Lab Results  Component Value Date   LDLCALC 112 (H) 12/20/2023   Lab Results  Component Value Date   TRIG 65.0 12/20/2023   Lab Results  Component Value Date   CHOLHDL 3 12/20/2023   Lab Results  Component Value Date   HGBA1C 4.9 04/18/2023       Assessment & Plan:  Hx of constipation Assessment & Plan: Pt had good relief with enema given in er  Con't senna    Impacted cerumen of left ear Assessment & Plan: Irrigated successfully  Con't as needed debrox  No qtips      [name] [name], DO

## 2024-05-08 NOTE — Assessment & Plan Note (Signed)
 Irrigated successfully  Con't as needed debrox  No qtips

## 2024-05-08 NOTE — Telephone Encounter (Signed)
 Per Dr. Tasia, patients [name]  dose was changed

## 2024-05-08 NOTE — Assessment & Plan Note (Signed)
 Pt had good relief with enema given in er  Con't senna

## 2024-05-08 NOTE — Therapy (Signed)
 OUTPATIENT PHYSICAL THERAPY NEURO TREATMENT      Patient Name: William [name] MRN: [id number] DOB:Dec 10, 1940, 83 y.o., male Today's Date: 05/08/2024  PCP: [name]  DO REFERRING PROVIDER: same   END OF SESSION:  PT End of Session - 05/08/24 1544     Visit Number 102    Date for Recertification  06/10/24    Authorization Type HTA    PT Start Time 1540    PT Stop Time 1625    PT Time Calculation (min) 45 min    Equipment Utilized During Treatment Gait belt    Activity Tolerance Patient tolerated treatment well    Behavior During Therapy WFL for tasks assessed/performed                   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   HTN (hypertension) 07/19/2015   Hyperlipidemia    Kidney disease, chronic, stage III (GFR 30-59 ml/min) (HCC)    Major neurocognitive disorder 01/09/2014   Mild, related to stroke history   Nocturia    Parkinson disease (HCC)    Renal insufficiency 06/25/2013   S/P carotid endarterectomy    BILATERAL ICA--  PATENT PER DUPLEX  05-19-2012   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 09/26/2017   Right calf   Urinary frequency    Vitamin D  deficiency    Past Surgical History:  Procedure Laterality Date   APPENDECTOMY  AS CHILD   CARDIOVASCULAR STRESS TEST  03-27-2012  DR CRENSHAW   LOW RISK LEXISCAN  STUDY-- PROBABLE NORMAL PERFUSION AND SOFT TISSUE ATTENUATION/  NO ISCHEMIA/ EF 51%   CAROTID ENDARTERECTOMY Bilateral LEFT  11-12-2008  DR GREG HAYES   RIGHT ICA  2006  ([facility])   CHOLECYSTECTOMY N/A 02/23/2022    Procedure: LAPAROSCOPIC CHOLECYSTECTOMY;  Surgeon: [name], MD;  Location: [facility];  Service: General;  Laterality: N/A;   CYSTOSCOPY W/ RETROGRADES Bilateral 06/22/2021   Procedure: CYSTOSCOPY WITH RETROGRADE PYELOGRAM;  Surgeon: [name], MD;  Location: [facility];  Service: Urology;  Laterality: Bilateral;   CYSTOSCOPY WITH LITHOLAPAXY N/A 02/26/2013   Procedure: CYSTOSCOPY WITH LITHOLAPAXY;  Surgeon: Senior Matilda, MD;  Location: [facility];  Service: Urology;  Laterality: N/A;   ENDOSCOPIC RETROGRADE CHOLANGIOPANCREATOGRAPHY (ERCP) WITH PROPOFOL  N/A 02/22/2022   Procedure: ENDOSCOPIC RETROGRADE CHOLANGIOPANCREATOGRAPHY (ERCP) WITH PROPOFOL ;  Surgeon: [name], MD;  Location: [facility] ENDOSCOPY;  Service: Gastroenterology;  Laterality: N/A;   EYE SURGERY  Jan. 2016   cataract surgery both eyes   INGUINAL HERNIA REPAIR Right 11-08-2006   IR KYPHO EA ADDL LEVEL THORACIC OR LUMBAR  02/12/2021   IR RADIOLOGIST EVAL & MGMT  02/18/2021   MASS EXCISION N/A 03/03/2016   Procedure: EXCISION OF BACK  MASS;  Surgeon: [name], MD;  Location: [facility];  Service: General;  Laterality: N/A;   MOHS SURGERY Left 1/ 2016   [name]-- Basal cell   PROSTATE SURGERY     REMOVAL OF STONES  02/22/2022   Procedure: REMOVAL OF STONES;  Surgeon: [name], MD;  Location: [facility] ENDOSCOPY;  Service: Gastroenterology;;   [name]  02/22/2022   Procedure: [name];  Surgeon: [name], MD;  Location: [facility] ENDOSCOPY;  Service: Gastroenterology;;   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 N/A 06/22/2021   Procedure: TRANSURETHRAL RESECTION OF BLADDER TUMOR;  Surgeon: [name], MD;  Location: [facility];  Service: Urology;  Laterality: N/A;   TRANSURETHRAL RESECTION OF PROSTATE N/A 02/26/2013   Procedure: TRANSURETHRAL RESECTION OF THE PROSTATE WITH GYRUS INSTRUMENTS;  Surgeon: Senior Matilda, MD;   Location: [facility];  Service: Urology;  Laterality: N/A;   TRANSURETHRAL RESECTION OF PROSTATE N/A 06/22/2021   Procedure: TRANSURETHRAL RESECTION OF THE PROSTATE (TURP);  Surgeon: [name], MD;  Location: [facility];  Service: Urology;  Laterality: N/A;   Patient Active Problem List   Diagnosis Date Noted   Bronchitis 06/16/2023   Impacted cerumen of left ear 06/16/2023   Acute encephalopathy 04/18/2023   Parkinson's disease (HCC) 04/18/2023   History of CVA with residual deficit 04/18/2023   Chronic kidney disease, stage 3b (HCC) 04/18/2023   Dementia with behavioral disturbance (HCC) 04/18/2023   Anemia of chronic disease 04/18/2023   Laceration of right upper extremity 02/24/2023   Dermatitis 02/24/2023   Lung nodules 02/24/2023   Dysuria 08/03/2022   Nonintractable headache 07/01/2022   Bilateral impacted cerumen 06/24/2022   Rash 06/15/2022   Postoperative ileus (HCC) 02/27/2022   Ileus, postoperative (HCC) 02/26/2022   Choledocholithiasis 02/19/2022   DNR (do not resuscitate) 02/19/2022   Left elbow pain 01/26/2022   Mid back pain on left side 01/26/2022   Rib pain 01/26/2022   Acute pain of left shoulder 11/12/2021   Leukocytes in urine 11/12/2021   Urinary frequency 11/12/2021   Thrush 10/08/2021   Hemiplegia, 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   Insomnia    Prediabetes    Acute 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 (HCC)    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   Transaminitis 07/27/2021   UTI (urinary tract infection) 07/27/2021   CVA (cerebral vascular accident) (HCC) 07/27/2021   Fall 07/27/2021   Hyperglycemia 07/27/2021   Cholelithiasis 07/27/2021   Hypoxia 07/27/2021   Nausea and vomiting 07/27/2021   Acute metabolic encephalopathy 07/27/2021   Normocytic anemia 07/27/2021   Chronic back pain 07/27/2021   Malignant neoplasm of overlapping sites of  bladder (HCC) 06/22/2021   Closed fracture of first lumbar vertebra with routine healing 02/03/2021   Closed fracture of multiple ribs 11/18/2020   Anxiety 01/29/2020   Leg pain, bilateral 01/29/2020   Ingrown toenail 07/13/2019   Lumbar spondylosis 05/02/2018   Pain in left knee 03/09/2018   Osteoarthritis of left hip 01/16/2018   Trochanteric bursitis of left hip 01/16/2018   Preventative health care 09/26/2017   HTN (hypertension) 07/19/2015   Hyperlipidemia 07/19/2015   Great toe pain 02/11/2014   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   Obesity (BMI 30-39.9) 06/25/2013   Renal insufficiency 06/25/2013   Weakness of left arm 06/25/2013   Sebaceous cyst 03/03/2011   Sprain of lumbar region 07/31/2010   Rib pain, left 08/29/2009   Carotid artery stenosis, asymptomatic, bilateral 05/02/2009   Eczema, atopic 05/31/2008   Vitamin D  deficiency 03/01/2008   BPH (benign prostatic hyperplasia) 08/06/2007   Fasting hyperglycemia 12/21/2006   History of right MCA infarct 2006    ONSET DATE: 06/29/22 REFERRING DIAG:  Diagnosis  I63.9 (ICD-10-CM) - Cerebrovascular accident (CVA), unspecified mechanism (HCC)  W19.XXXD (ICD-10-CM) - Fall, subsequent encounter    THERAPY DIAG:  Muscle weakness (generalized)  Other abnormalities of gait and mobility  Hemiplegia and hemiparesis following cerebral infarction affecting left non-dominant side (HCC)  Difficulty in walking, not elsewhere classified  Repeated falls  Rationale for Evaluation and Treatment: Rehabilitation  SUBJECTIVE:                                                                                                                                                                                         SUBJECTIVE STATEMENT:  I am doing okay. Nothing new to report from wife.   PERTINENT HISTORY:  William [name] is a 83 year old man with dementia, CKD, HTN, CAD, HLD and history of CVA  (2006, 2023),  Parkinson's  PAIN:  Are you having pain? Faces/behavior scale- mild pain BLEs  and back   PRECAUTIONS: Fall  WEIGHT BEARING RESTRICTIONS: No  FALLS: Has patient fallen in last 6 months? No  LIVING ENVIRONMENT: Lives with: lives with their family and lives with their spouse Lives in: House/apartment Stairs: 1 brick high step Has following equipment at home: Environmental Consultant - 2 wheeled, Wheelchair (manual), Shower bench, bed side commode, Grab bars, and hospital bed, sliding pad for car  PLOF: Independent with basic ADLs and Independent with household mobility without device  PATIENT GOALS: Walk around the block with his wife. Be able to use the dining room chair rather than have to use the W/C. Increased I with bed mobility and simple tasks at home like make some coffee or brush his teeth, Step over tub to shower, has bench, but he can't really use it. Up and down the one step to get to back deck. 11/23/22: Patient's wife Devere updated his goals today: Walk around store (gets distracted), swing a golf club again (thinking of driving range), get up on his own, walk without leaning forward, more recognition of the left side (from CVA), not need gait belt, OT-- be able to get dressed more on his own, navigate the newspaper.  OBJECTIVE:   TREATMENT: 05/08/24 NuStep L5x40mins  Bike L3 x46mins  UBE L2 x4 mins  LAQ 2# 2x10 Marching 2#  Kicking cones  Standing bouncing ball   05/03/24 Nustep level 4 x 6 minutes Bike level 4 x 5 minutes Gait outside with HHA and gait belt 1/2 parking [place] and then rest, then walk in the grass around the side of the building to the front UBE level 3 x 4 minutes LAQ Marches Standing ball toss and bounce Gait with HHA x 200 feet and  then out to the car  04/26/24 Tug 15 seconds Gait outside around 1/2 parking [place] and then through grass to the front door Partial sit ups Cone reaching with trunk rotation and elongation and to the floor, PT call out color and  he would reach out and or kick Standing fall catch, bounce or kick fairly rapidly Bike 5 minutes  04/17/24 Nustep level 5 x 5 minutes Bike level 4 x 5 minutes Gait outside with HHA up and down curbs, slopes all the way around the back building a rest at a table in the back, up the hill a standing rest and then to the table int he island to rest again, really struggled coming up the hill. Gait out to the car  04/10/24 Nustep lvl 5 for 6 min LE SciFit lvl 4 for 5 min UBE Bike lvl 3 2 min fwd, 2 min bkwd Ambulating around the clinic to find cones for cardiovascular endurance, functional mobility, and visual scanning.  Car and toilet transfers  04/05/24 Nustep level 5 x 6 minutes STM to the left upper trap Bike level 4 x 6 minutes Standing reaching for numbers called out up to a sequence of 4 Standing ball bouncing Gait outside around parking [place], rest and then around to the front, then 200 feet inside and out to car  03/27/24 Nustep level 5 x 6 minutes Bike level 5 x 5 minutes STM with the tgun to the left upper trap area Passive ROM to the left shoulder all motions to his tolerance UBE level 4 x 4 minutes Gait outside with HHA 1/2 the parking [place] and then rest and then around to the front of the building on the smooth surface Seated left LAQ, marches Standing ball bouncing Standing HHA side stepping and backward walking  03/20/24 Nustep level 5 x 6 minutes Bike level 4 x 5 minutes UBE level 3 x 4 minutes Gait outside 1/2 around the parking island rest and then through grass back to the front of the building some cues for step length Seated partial sit ups, worked on some trunk mobility trying to get him to lean side to side.  Ball bouncing in sitting and in standing.  Side stepping, gait 2 laps with HHA PROM of the left shoulder  03/15/2024 Nustep level 5 x 5 minutes Bike level 4 x 5 minutes Dribbling ball Partial sit ups Ball kicks Walked out side with HHA 1/2 way  around parking [place] and then rest and then from there to the front door through some deep grass, this was very difficult for him, needed a lot of cues to take bigger steps and needed more assist, once we did this he had a hard time getting his feet and steps back. Did 240 feet HHA walking in the clinic and then to the car after this to get the stepping pattern back  03/08/24 NuStep L5x  UBE L3 x65mins each way- hard time keeping hand on bar today AB isometric 2x10 Ball squeezes 2x10 Standing ball dribbling Kicking different color cones Dribbling ball back and forth with wife   02/28/24 NuStep L5x53mins UBE L2 x56mins each way  Seated PWR moves- holding big red ball touching floor and reaching overhead, rotations holding big red ball Punches with 2# weights  LAQ 5# 2x12 Modified sit ups 2x10  02/21/24 Nustep level 5 x 9 minutes UBE level 3 x 5 minutes Worked with wife on the lift from floor chair, is this feasible and possible for [employer]  to use.  I worked on trying to simulate this, with him having to sit up from supine with him going to long sitting.  Difficult for him due to tight HS, then can he push himself up onto a small seating surface, again very difficulty for him as he was falling backward Passive HS stretches Seated PWR moves and instructed his wife with these Gait out back door through the grass to the car  02/14/24 Nustep level 5 x 6 minutes Bike level 4 x 5 minutes UBE level 4 x 5 minutes STM to the left shoulder area mainly upper trap Some genlte PROM of the left shoulder Gait with HHA out the back door around one of the islands on the side of the back building back up the hill to the picnic table and rest and then through the grass on the side of the building to the front door, this was very fatiguing for him, a lot of cues verbal and physical to get him to stand up Gait out to the car  PATIENT EDUCATION: Education details: none new 02/16/23 Person educated: Patient  and Spouse Education method: Explanation, Demonstration, Tactile cues, and Verbal cues Education comprehension: verbalized understanding, returned demonstration, verbal cues required, tactile cues required, and needs further education   ------------------------------------------------------------------------------------------------------- (Measures in this section from initial evaluation unless otherwise noted) DIAGNOSTIC FINDINGS:  MRI 2021 with degenerative changes in lumbar spine, L3-4 R subarticular stenosis COGNITION: Overall cognitive status: History of cognitive impairments - at baseline SENSATION: Not tested COORDINATION: Moderately impaired BLE MUSCLE LENGTH: Hamstrings: severely restricted B Thomas test: Severely restricted B. POSTURE: rounded shoulders, forward head, increased thoracic kyphosis, posterior pelvic tilt, and flexed trunk  LOWER EXTREMITY ROM:   BLE extremely stiff throughout, limited hip ROM in all planes, B knees limited in extension. LOWER EXTREMITY MMT:  3+/5 throughout. Unable to determine accuracy of MMT due to cognition. Functionally demosntrates poor coordinated activation and poor muscular endurance. BED MOBILITY: Min A for Supine<> Sit. Patient was using a ladder in his hospital bed and could move MI. TRANSFERS: Min A, mod VC, tends to lean back.  11/02/22 independent with use of hands on arm chair with a couple of tries CURB: Min A-Has one step out to his deck. STAIRS: N/A  GAIT: Gait pattern: step to pattern, decreased arm swing- Right, decreased arm swing- Left, decreased step length- Right, decreased step length- Left, shuffling, festinating, trunk flexed, narrow BOS, poor foot clearance- Right, and poor foot clearance- Left Distance walked: 37' Assistive device utilized: Environmental Consultant - 2 wheeled Level of assistance: Occasional min A Comments: mod VC to stay close to walker, take longer steps. FUNCTIONAL TESTS:  5 times sit to stand: 38.23  09/29/22  could  not performs 5XSTS, 11/02/22 = 47 seconds with cues to use arms, 07/21/23 really struggled today 55 seconds Timed up and go (TUG): 47.91 with RW, 07/21/22 TUG 29  seconds without device, 09/29/22 25 seconds, 11/02/22 TUG 20 seconds, 07/21/23 = 26 seconds no device SBA                                                                     PATIENT EDUCATION: Education details: Progress towards goals, POC Person educated: Patient and Spouse Education method: Programmer, Multimedia, Demonstration,  and Verbal cues  Education comprehension: verbalized understanding, returned demonstration, verbal cues required, and needs further education  HOME EXERCISE PROGRAM: PWR! moves  GOALS:  SHORT TERM GOALS: Target date: 01/25/24   Pt will perform HEP to maintain lower body strength, flexibility, posture with wife's assistance. Baseline:  Goal status: met 04/12/23  2. Pt will perform sit to stand, 8 of 10 reps with BUE support, with supervision, to maintain safety with transfers.  Baseline: min guard  Goal status: 02/07/23-CGA from elevated surface and max VC to lean forward, ongoing  continues to need a lot of cues for hand placement and to lean forward, tends to lean backward, needs cues and some assistance 02/28/24; 04/11/24 - requires CGA-MinA with Sit>Stand, can transfer with SBA and VC's 04/26/24  LONG TERM GOALS: Target date: 04/10/24   Patient will perform HEP progression to maintain lower body strength, flexibility, posture with wife's assistance. Baseline:  Goal status: has been able to walk with wife multiple times a day 03/27/24; pt states compliance to HEP and walking to and from the mailbox w wife every day 04/11/24  2.  Pt will ambulate 150-175 ft in 2 MWT with min guard assist, to preserve functional mobility within the home.  Baseline: Goal status:  met 08/25/23  3. Wife will verbalize tips for fall prevention education to minimize fall frequency Baseline:  Goal status:progressing and doing well 4.   Patient will be able to toilet without freezing for over a 4 week period met 11/15/23  5.  Patient will be able to get out of bed from supine with wife assist minimal Assist,met 10/06/23   ASSESSMENT:  CLINICAL IMPRESSION:  Patient continues to demo mild inattention to the L side d/t previous CVA potentially. CGA-MinA with balance. He seems to be having more difficulty swallowing and with verbalizing. Pt does well with kicking and showing good stepping strategies. He has increased shuffling towards end of session, more forward bend and had to be cued to prevent falling.   OBJECTIVE IMPAIRMENTS: Abnormal gait, decreased activity tolerance, decreased balance, decreased cognition, decreased coordination, decreased endurance, decreased mobility, difficulty walking, decreased ROM, decreased strength, decreased safety awareness, impaired flexibility, impaired UE functional use, improper body mechanics, and postural dysfunction.   PLAN:  PT FREQUENCY: 1x/week  PT DURATION: 12 weeks  PLANNED INTERVENTIONS: Therapeutic exercises, Therapeutic activity, Neuromuscular re-education, Balance training, Gait training, Patient/Family education, Self Care, Joint mobilization, Stair training, Moist heat, and Manual therapy  PLAN FOR NEXT SESSION:   Will continue to work with patient to help him and his wife stay living at home and with less difficulty   Patient Details  Name: DRE GAMINO MRN: [id number] Date of Birth: 1940/07/21 Referring Provider:  [name] Jamee William [name], *  Ozell Mainland, PT

## 2024-05-14 ENCOUNTER — Ambulatory Visit: Admitting: Family Medicine

## 2024-05-21 ENCOUNTER — Other Ambulatory Visit: Payer: Self-pay | Admitting: Family Medicine

## 2024-05-21 ENCOUNTER — Other Ambulatory Visit (HOSPITAL_BASED_OUTPATIENT_CLINIC_OR_DEPARTMENT_OTHER): Payer: Self-pay

## 2024-05-21 MED ORDER — PANTOPRAZOLE SODIUM 40 MG PO TBEC
40.0000 mg | DELAYED_RELEASE_TABLET | Freq: Every day | ORAL | 1 refills | Status: AC
  Filled 2024-05-21: qty 90, 90d supply, fill #0

## 2024-05-22 ENCOUNTER — Encounter: Payer: Self-pay | Admitting: Physical Therapy

## 2024-05-22 ENCOUNTER — Ambulatory Visit: Attending: Family Medicine | Admitting: Physical Therapy

## 2024-05-22 DIAGNOSIS — M25512 Pain in left shoulder: Secondary | ICD-10-CM | POA: Diagnosis present

## 2024-05-22 DIAGNOSIS — R2689 Other abnormalities of gait and mobility: Secondary | ICD-10-CM | POA: Insufficient documentation

## 2024-05-22 DIAGNOSIS — Z9181 History of falling: Secondary | ICD-10-CM | POA: Diagnosis present

## 2024-05-22 DIAGNOSIS — R296 Repeated falls: Secondary | ICD-10-CM | POA: Insufficient documentation

## 2024-05-22 DIAGNOSIS — M6281 Muscle weakness (generalized): Secondary | ICD-10-CM | POA: Diagnosis present

## 2024-05-22 DIAGNOSIS — R262 Difficulty in walking, not elsewhere classified: Secondary | ICD-10-CM | POA: Insufficient documentation

## 2024-05-22 DIAGNOSIS — I69354 Hemiplegia and hemiparesis following cerebral infarction affecting left non-dominant side: Secondary | ICD-10-CM | POA: Insufficient documentation

## 2024-05-22 NOTE — Therapy (Signed)
 OUTPATIENT PHYSICAL THERAPY NEURO TREATMENT      Patient Name: William [name] MRN: [id number] DOB:1940-09-27, 83 y.o., male Today's Date: 05/22/2024  PCP: [name]  DO REFERRING PROVIDER: same   END OF SESSION:  PT End of Session - 05/22/24 1447     Visit Number [id number]    Date for Recertification  06/10/24    Authorization Type HTA    PT Start Time 1448    PT Stop Time 1530    PT Time Calculation (min) 42 min    Equipment Utilized During Treatment Gait belt    Activity Tolerance Patient tolerated treatment well    Behavior During Therapy WFL for tasks assessed/performed                    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   HTN (hypertension) 07/19/2015   Hyperlipidemia    Kidney disease, chronic, stage III (GFR 30-59 ml/min) (HCC)    Major neurocognitive disorder 01/09/2014   Mild, related to stroke history   Nocturia    Parkinson disease (HCC)    Renal insufficiency 06/25/2013   S/P carotid endarterectomy    BILATERAL ICA--  PATENT PER DUPLEX  05-19-2012   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/26/2017   Right calf   Urinary frequency    Vitamin D  deficiency    Past Surgical History:  Procedure Laterality Date   APPENDECTOMY  AS CHILD   CARDIOVASCULAR STRESS TEST  03-27-2012  DR CRENSHAW   LOW RISK LEXISCAN  STUDY-- PROBABLE NORMAL PERFUSION AND SOFT TISSUE ATTENUATION/  NO ISCHEMIA/ EF 51%   CAROTID ENDARTERECTOMY Bilateral LEFT  11-12-2008  DR GREG HAYES   RIGHT ICA  2006  ([facility])   CHOLECYSTECTOMY N/A 02/23/2022    Procedure: LAPAROSCOPIC CHOLECYSTECTOMY;  Surgeon: [name], MD;  Location: [facility] OR;  Service: General;  Laterality: N/A;   CYSTOSCOPY W/ RETROGRADES Bilateral 06/22/2021   Procedure: CYSTOSCOPY WITH RETROGRADE PYELOGRAM;  Surgeon: [name], MD;  Location: [facility];  Service: Urology;  Laterality: Bilateral;   CYSTOSCOPY WITH LITHOLAPAXY N/A 02/26/2013   Procedure: CYSTOSCOPY WITH LITHOLAPAXY;  Surgeon: Senior Matilda, MD;  Location: [facility];  Service: Urology;  Laterality: N/A;   ENDOSCOPIC RETROGRADE CHOLANGIOPANCREATOGRAPHY (ERCP) WITH PROPOFOL  N/A 02/22/2022   Procedure: ENDOSCOPIC RETROGRADE CHOLANGIOPANCREATOGRAPHY (ERCP) WITH PROPOFOL ;  Surgeon: [name], MD;  Location: [facility] ENDOSCOPY;  Service: Gastroenterology;  Laterality: N/A;   EYE SURGERY  Jan. 2016   cataract surgery both eyes   INGUINAL HERNIA REPAIR Right 11-08-2006   IR KYPHO EA ADDL LEVEL THORACIC OR LUMBAR  02/12/2021   IR RADIOLOGIST EVAL & MGMT  02/18/2021   MASS EXCISION N/A 03/03/2016   Procedure: EXCISION OF BACK  MASS;  Surgeon: [name], MD;  Location:  SURGERY CENTER;  Service: General;  Laterality: N/A;   MOHS SURGERY Left 1/ 2016   [name]-- Basal cell   PROSTATE SURGERY     REMOVAL OF STONES  02/22/2022   Procedure: REMOVAL OF STONES;  Surgeon: [name], MD;  Location: [facility] ENDOSCOPY;  Service: Gastroenterology;;   [name]  02/22/2022   Procedure: [name];  Surgeon: [name], MD;  Location: [facility] ENDOSCOPY;  Service: Gastroenterology;;   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 N/A 06/22/2021   Procedure: TRANSURETHRAL RESECTION OF BLADDER TUMOR;  Surgeon: [name], MD;  Location: [facility];  Service: Urology;  Laterality: N/A;   TRANSURETHRAL RESECTION OF PROSTATE N/A 02/26/2013   Procedure: TRANSURETHRAL RESECTION OF THE PROSTATE WITH GYRUS INSTRUMENTS;  Surgeon: Senior Matilda, MD;   Location: [facility];  Service: Urology;  Laterality: N/A;   TRANSURETHRAL RESECTION OF PROSTATE N/A 06/22/2021   Procedure: TRANSURETHRAL RESECTION OF THE PROSTATE (TURP);  Surgeon: [name], MD;  Location: [facility];  Service: Urology;  Laterality: N/A;   Patient Active Problem List   Diagnosis Date Noted   Hx of constipation 05/08/2024   Bronchitis 06/16/2023   Impacted cerumen of left ear 06/16/2023   Acute encephalopathy 04/18/2023   Parkinson's disease (HCC) 04/18/2023   History of CVA with residual deficit 04/18/2023   Chronic kidney disease, stage 3b (HCC) 04/18/2023   Dementia with behavioral disturbance (HCC) 04/18/2023   Anemia of chronic disease 04/18/2023   Laceration of right upper extremity 02/24/2023   Dermatitis 02/24/2023   Lung nodules 02/24/2023   Dysuria 08/03/2022   Nonintractable headache 07/01/2022   Bilateral impacted cerumen 06/24/2022   Rash 06/15/2022   Postoperative ileus (HCC) 02/27/2022   Ileus, postoperative (HCC) 02/26/2022   Choledocholithiasis 02/19/2022   DNR (do not resuscitate) 02/19/2022   Left elbow pain 01/26/2022   Mid back pain on left side 01/26/2022   Rib pain 01/26/2022   Acute pain of left shoulder 11/12/2021   Leukocytes in urine 11/12/2021   Urinary frequency 11/12/2021   Thrush 10/08/2021   Hemiplegia, 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   Insomnia    Prediabetes    Acute 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 (HCC)    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   Transaminitis 07/27/2021   UTI (urinary tract infection) 07/27/2021   CVA (cerebral vascular accident) (HCC) 07/27/2021   Fall 07/27/2021   Hyperglycemia 07/27/2021   Cholelithiasis 07/27/2021   Hypoxia 07/27/2021   Nausea and vomiting 07/27/2021   Acute metabolic encephalopathy 07/27/2021   Normocytic anemia 07/27/2021   Chronic back pain 07/27/2021   Malignant  neoplasm of overlapping sites of bladder (HCC) 06/22/2021   Closed fracture of first lumbar vertebra with routine healing 02/03/2021   Closed fracture of multiple ribs 11/18/2020   Anxiety 01/29/2020   Leg pain, bilateral 01/29/2020   Ingrown toenail 07/13/2019   Lumbar spondylosis 05/02/2018   Pain in left knee 03/09/2018   Osteoarthritis of left hip 01/16/2018   Trochanteric bursitis of left hip 01/16/2018   Preventative health care 09/26/2017   HTN (hypertension) 07/19/2015   Hyperlipidemia 07/19/2015   Great toe pain 02/11/2014   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   Obesity (BMI 30-39.9) 06/25/2013   Renal insufficiency 06/25/2013   Weakness of left arm 06/25/2013   Sebaceous cyst 03/03/2011   Sprain of lumbar region 07/31/2010   Rib pain, left 08/29/2009   Carotid artery stenosis, asymptomatic, bilateral 05/02/2009   Eczema, atopic 05/31/2008   Vitamin D  deficiency 03/01/2008   BPH (benign prostatic hyperplasia) 08/06/2007   Fasting hyperglycemia 12/21/2006   History of right MCA infarct 2006  ONSET DATE: 06/29/22 REFERRING DIAG:  Diagnosis  I63.9 (ICD-10-CM) - Cerebrovascular accident (CVA), unspecified mechanism (HCC)  W19.XXXD (ICD-10-CM) - Fall, subsequent encounter    THERAPY DIAG:  Muscle weakness (generalized)  Other abnormalities of gait and mobility  Hemiplegia and hemiparesis following cerebral infarction affecting left non-dominant side (HCC)  Difficulty in walking, not elsewhere classified  Repeated falls  Rationale for Evaluation and Treatment: Rehabilitation  SUBJECTIVE:                                                                                                                                                                                         SUBJECTIVE STATEMENT:  I am doing okay. Nothing new to report from wife.   PERTINENT HISTORY:  THEOTIS GERDEMAN is a 83 year old man with dementia, CKD, HTN, CAD, HLD and history of  CVA  (2006, 2023), Parkinson's  PAIN:  Are you having pain? Faces/behavior scale- mild pain BLEs  and back   PRECAUTIONS: Fall  WEIGHT BEARING RESTRICTIONS: No  FALLS: Has patient fallen in last 6 months? No  LIVING ENVIRONMENT: Lives with: lives with their family and lives with their spouse Lives in: House/apartment Stairs: 1 brick high step Has following equipment at home: Environmental Consultant - 2 wheeled, Wheelchair (manual), Shower bench, bed side commode, Grab bars, and hospital bed, sliding pad for car  PLOF: Independent with basic ADLs and Independent with household mobility without device  PATIENT GOALS: Walk around the block with his wife. Be able to use the dining room chair rather than have to use the W/C. Increased I with bed mobility and simple tasks at home like make some coffee or brush his teeth, Step over tub to shower, has bench, but he can't really use it. Up and down the one step to get to back deck. 11/23/22: Patient's wife Devere updated his goals today: Walk around store (gets distracted), swing a golf club again (thinking of driving range), get up on his own, walk without leaning forward, more recognition of the left side (from CVA), not need gait belt, OT-- be able to get dressed more on his own, navigate the newspaper.  OBJECTIVE:   TREATMENT: 05/22/24 NuStep x6 mins level 5 Seated physioball push with therapist Seated ball pass with 2# cuff weights in multiple directions LE Knee flexion extension on physioball  Seated hip extension on small ball with UE on knee and axial pressure through LLE  05/08/24 NuStep L5x63mins  Bike L3 x90mins  UBE L2 x4 mins  LAQ 2# 2x10 Marching 2#  Kicking cones  Standing bouncing ball   05/03/24 Nustep level 4 x 6 minutes Bike level 4  x 5 minutes Gait outside with HHA and gait belt 1/2 parking [place] and then rest, then walk in the grass around the side of the building to the front UBE level 3 x 4 minutes LAQ Marches Standing ball  toss and bounce Gait with HHA x 200 feet and then out to the car  04/26/24 Tug 15 seconds Gait outside around 1/2 parking [place] and then through grass to the front door Partial sit ups Cone reaching with trunk rotation and elongation and to the floor, PT call out color and he would reach out and or kick Standing fall catch, bounce or kick fairly rapidly Bike 5 minutes  04/17/24 Nustep level 5 x 5 minutes Bike level 4 x 5 minutes Gait outside with HHA up and down curbs, slopes all the way around the back building a rest at a table in the back, up the hill a standing rest and then to the table int he island to rest again, really struggled coming up the hill. Gait out to the car  04/10/24 Nustep lvl 5 for 6 min LE SciFit lvl 4 for 5 min UBE Bike lvl 3 2 min fwd, 2 min bkwd Ambulating around the clinic to find cones for cardiovascular endurance, functional mobility, and visual scanning.  Car and toilet transfers  04/05/24 Nustep level 5 x 6 minutes STM to the left upper trap Bike level 4 x 6 minutes Standing reaching for numbers called out up to a sequence of 4 Standing ball bouncing Gait outside around parking [place], rest and then around to the front, then 200 feet inside and out to car  03/27/24 Nustep level 5 x 6 minutes Bike level 5 x 5 minutes STM with the tgun to the left upper trap area Passive ROM to the left shoulder all motions to his tolerance UBE level 4 x 4 minutes Gait outside with HHA 1/2 the parking [place] and then rest and then around to the front of the building on the smooth surface Seated left LAQ, marches Standing ball bouncing Standing HHA side stepping and backward walking  03/20/24 Nustep level 5 x 6 minutes Bike level 4 x 5 minutes UBE level 3 x 4 minutes Gait outside 1/2 around the parking island rest and then through grass back to the front of the building some cues for step length Seated partial sit ups, worked on some trunk mobility trying to  get him to lean side to side.  Ball bouncing in sitting and in standing.  Side stepping, gait 2 laps with HHA PROM of the left shoulder  03/15/2024 Nustep level 5 x 5 minutes Bike level 4 x 5 minutes Dribbling ball Partial sit ups Ball kicks Walked out side with HHA 1/2 way around parking [place] and then rest and then from there to the front door through some deep grass, this was very difficult for him, needed a lot of cues to take bigger steps and needed more assist, once we did this he had a hard time getting his feet and steps back. Did 240 feet HHA walking in the clinic and then to the car after this to get the stepping pattern back  03/08/24 NuStep L5x  UBE L3 x22mins each way- hard time keeping hand on bar today AB isometric 2x10 Ball squeezes 2x10 Standing ball dribbling Kicking different color cones Dribbling ball back and forth with wife   02/28/24 NuStep L5x84mins UBE L2 x58mins each way  Seated PWR moves- holding big red ball touching floor  and reaching overhead, rotations holding big red ball Punches with 2# weights  LAQ 5# 2x12 Modified sit ups 2x10  02/21/24 Nustep level 5 x 9 minutes UBE level 3 x 5 minutes Worked with wife on the lift from floor chair, is this feasible and possible for Octaviano to use.  I worked on trying to simulate this, with him having to sit up from supine with him going to long sitting.  Difficult for him due to tight HS, then can he push himself up onto a small seating surface, again very difficulty for him as he was falling backward Passive HS stretches Seated PWR moves and instructed his wife with these Gait out back door through the grass to the car  02/14/24 Nustep level 5 x 6 minutes Bike level 4 x 5 minutes UBE level 4 x 5 minutes STM to the left shoulder area mainly upper trap Some genlte PROM of the left shoulder Gait with HHA out the back door around one of the islands on the side of the back building back up the hill to the picnic  table and rest and then through the grass on the side of the building to the front door, this was very fatiguing for him, a lot of cues verbal and physical to get him to stand up Gait out to the car  PATIENT EDUCATION: Education details: none new 02/16/23 Person educated: Patient and Spouse Education method: Explanation, Demonstration, Tactile cues, and Verbal cues Education comprehension: verbalized understanding, returned demonstration, verbal cues required, tactile cues required, and needs further education   ------------------------------------------------------------------------------------------------------- (Measures in this section from initial evaluation unless otherwise noted) DIAGNOSTIC FINDINGS:  MRI 2021 with degenerative changes in lumbar spine, L3-4 R subarticular stenosis COGNITION: Overall cognitive status: History of cognitive impairments - at baseline SENSATION: Not tested COORDINATION: Moderately impaired BLE MUSCLE LENGTH: Hamstrings: severely restricted B Thomas test: Severely restricted B. POSTURE: rounded shoulders, forward head, increased thoracic kyphosis, posterior pelvic tilt, and flexed trunk  LOWER EXTREMITY ROM:   BLE extremely stiff throughout, limited hip ROM in all planes, B knees limited in extension. LOWER EXTREMITY MMT:  3+/5 throughout. Unable to determine accuracy of MMT due to cognition. Functionally demosntrates poor coordinated activation and poor muscular endurance. BED MOBILITY: Min A for Supine<> Sit. Patient was using a ladder in his hospital bed and could move MI. TRANSFERS: Min A, mod VC, tends to lean back.  11/02/22 independent with use of hands on arm chair with a couple of tries CURB: Min A-Has one step out to his deck. STAIRS: N/A  GAIT: Gait pattern: step to pattern, decreased arm swing- Right, decreased arm swing- Left, decreased step length- Right, decreased step length- Left, shuffling, festinating, trunk flexed, narrow BOS, poor foot  clearance- Right, and poor foot clearance- Left Distance walked: 30' Assistive device utilized: Environmental Consultant - 2 wheeled Level of assistance: Occasional min A Comments: mod VC to stay close to walker, take longer steps. FUNCTIONAL TESTS:  5 times sit to stand: 38.23  09/29/22  could not performs 5XSTS, 11/02/22 = 47 seconds with cues to use arms, 07/21/23 really struggled today 55 seconds Timed up and go (TUG): 47.91 with RW, 07/21/22 TUG 29  seconds without device, 09/29/22 25 seconds, 11/02/22 TUG 20 seconds, 07/21/23 = 26 seconds no device SBA  PATIENT EDUCATION: Education details: Progress towards goals, POC Person educated: Patient and Spouse Education method: Explanation, Demonstration, and Verbal cues  Education comprehension: verbalized understanding, returned demonstration, verbal cues required, and needs further education  HOME EXERCISE PROGRAM: PWR! moves  GOALS:  SHORT TERM GOALS: Target date: 01/25/24   Pt will perform HEP to maintain lower body strength, flexibility, posture with wife's assistance. Baseline:  Goal status: met 04/12/23  2. Pt will perform sit to stand, 8 of 10 reps with BUE support, with supervision, to maintain safety with transfers.  Baseline: min guard  Goal status: 02/07/23-CGA from elevated surface and max VC to lean forward, ongoing  continues to need a lot of cues for hand placement and to lean forward, tends to lean backward, needs cues and some assistance 02/28/24; 04/11/24 - requires CGA-MinA with Sit>Stand, can transfer with SBA and VC's 04/26/24  LONG TERM GOALS: Target date: 04/10/24   Patient will perform HEP progression to maintain lower body strength, flexibility, posture with wife's assistance. Baseline:  Goal status: has been able to walk with wife multiple times a day 03/27/24; pt states compliance to HEP and walking to and from the mailbox w wife every day 04/11/24  2.  Pt will  ambulate 150-175 ft in 2 MWT with min guard assist, to preserve functional mobility within the home.  Baseline: Goal status:  met 08/25/23  3. Wife will verbalize tips for fall prevention education to minimize fall frequency Baseline:  Goal status:progressing and doing well 4.  Patient will be able to toilet without freezing for over a 4 week period met 11/15/23  5.  Patient will be able to get out of bed from supine with wife assist minimal Assist,met 10/06/23   ASSESSMENT:  CLINICAL IMPRESSION: *** Patient continues to demo mild inattention to the L side d/t previous CVA potentially. CGA-MinA with balance. He seems to be having more difficulty swallowing and with verbalizing. Pt does well with kicking and showing good stepping strategies. He has increased shuffling towards end of session, more forward bend and had to be cued to prevent falling.   OBJECTIVE IMPAIRMENTS: Abnormal gait, decreased activity tolerance, decreased balance, decreased cognition, decreased coordination, decreased endurance, decreased mobility, difficulty walking, decreased ROM, decreased strength, decreased safety awareness, impaired flexibility, impaired UE functional use, improper body mechanics, and postural dysfunction.   PLAN:  PT FREQUENCY: 1x/week  PT DURATION: 12 weeks  PLANNED INTERVENTIONS: Therapeutic exercises, Therapeutic activity, Neuromuscular re-education, Balance training, Gait training, Patient/Family education, Self Care, Joint mobilization, Stair training, Moist heat, and Manual therapy  PLAN FOR NEXT SESSION:   Will continue to work with patient to help him and his wife stay living at home and with less difficulty   Patient Details  Name: William [name] MRN: [id number] Date of Birth: 1940-08-20 Referring Provider:  [name], *  Stann Ohara PT, DPT, CLT, CES 05/22/24 2:48 PM

## 2024-05-25 ENCOUNTER — Other Ambulatory Visit: Payer: Self-pay

## 2024-05-25 ENCOUNTER — Other Ambulatory Visit (HOSPITAL_BASED_OUTPATIENT_CLINIC_OR_DEPARTMENT_OTHER): Payer: Self-pay

## 2024-05-25 ENCOUNTER — Other Ambulatory Visit (HOSPITAL_COMMUNITY): Payer: Self-pay | Admitting: Psychiatry

## 2024-05-29 ENCOUNTER — Encounter (HOSPITAL_BASED_OUTPATIENT_CLINIC_OR_DEPARTMENT_OTHER): Payer: Self-pay

## 2024-05-29 ENCOUNTER — Ambulatory Visit (HOSPITAL_COMMUNITY): Admitting: Psychiatry

## 2024-05-29 ENCOUNTER — Other Ambulatory Visit (HOSPITAL_BASED_OUTPATIENT_CLINIC_OR_DEPARTMENT_OTHER): Payer: Self-pay

## 2024-05-29 ENCOUNTER — Other Ambulatory Visit: Payer: Self-pay

## 2024-05-29 VITALS — BP 135/60 | HR 68 | Ht 71.0 in | Wt 142.0 lb

## 2024-05-29 DIAGNOSIS — G4700 Insomnia, unspecified: Secondary | ICD-10-CM

## 2024-05-29 DIAGNOSIS — F4321 Adjustment disorder with depressed mood: Secondary | ICD-10-CM

## 2024-05-29 MED ORDER — LORAZEPAM 1 MG PO TABS
1.0000 mg | ORAL_TABLET | Freq: Three times a day (TID) | ORAL | 4 refills | Status: AC
  Filled 2024-05-29: qty 90, 30d supply, fill #0
  Filled 2024-07-04: qty 90, 30d supply, fill #1

## 2024-05-29 MED ORDER — TRAZODONE HCL 150 MG PO TABS
75.0000 mg | ORAL_TABLET | Freq: Every day | ORAL | 6 refills | Status: AC
  Filled 2024-05-29 – 2024-06-13 (×2): qty 20, 40d supply, fill #0

## 2024-05-29 NOTE — Progress Notes (Signed)
 Today's date is 05/29/2024.  The patient is seen today with his wife Devere.  Unfortunately had a bad reaction in response to [name] .  It made him constipated and it affected his Parkinson symptoms.  This is very unusual for [name] .  His wife stopped it about a week and a half ago.  He is now better.  Her target is to try to get him to be calmer around 3 or 4:00 when he sundown's.  He goes to bed about 6 or 7.  The patient believes that some of his symptoms are from progressive Parkinson's and she is considering calling his neurologist to perhaps get an increase in his Sinemet .  Overall he is sleeping very poorly.  He is eating okay.  Most the time is pretty cooperative throughout the morning and he still goes to [facility].  There they do walking.  So he gets up and gets around and he does seem to engage a number of residents there.  But by the end of the day early afternoon seems to get agitated.  He is somewhat physically agitated but is never overtly violent.  But he is obviously very uncomfortable.    Assessment/plan  At this time it is clear that he should not take any more [name] .  He also got very constipated became impacted and had to go to the emergency room which his wife thinks is related to the [name] .  Again that is an unusual effect from [name] .  Nonetheless today we will plate safe and adjust up his Ativan .  He will continue taking 0.25 mg every morning but I will change his Ativan  to taking a full 1 mg at 3:00 in the afternoon and then another 1 mg at about 6 or 7 when he is about to go to bed.  He continues taking trazodone  and continues taking Tegretol  as prescribed.  My hope is that more Ativan  will make him calmer and is certainly should not be producing constipation.  He is not oversedated.  He continues taking 0.25 mg of Ativan  in the morning that his wife will give him and he seems to do pretty well with that.  Hopefully will target the Ativan  higher dose at 3:00 in the  afternoon.  Patient to return to see me in 6 or 7 weeks.   This is the assessment and plan for 11/08/2023.  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  The patient's dementing process is very slow and very stable.  He is functioning very well.  He continues taking Tegretol  which reduce his irritability.  He also takes Ativan  0.5 mg in the morning midday and 1 mg at night.  He takes Desyrel  for sleep which works well.  He is sleeping and eating well and showing no behavioral disturbances.  He will return to see me in 5 months.   Today's date is 03/29/2023.  The patient is diagnosed with Parkinson's dementia.  He is also diagnosed with an adjustment disorder with anxious mood state.  This is a state that requires him to be on a low dose of Ativan  that he has been taking now for years.  It is very helpful for sleep.  He also takes a little bit in the morning and some in the afternoon which helps his agitation.  Says both for helping him sleep but also having an antiagitation affect.  The Ativan  together with his Tegretol  have been very effective.  For sleep he also takes some trazodone .

## 2024-06-01 ENCOUNTER — Other Ambulatory Visit: Payer: Self-pay

## 2024-06-01 ENCOUNTER — Other Ambulatory Visit (HOSPITAL_BASED_OUTPATIENT_CLINIC_OR_DEPARTMENT_OTHER): Payer: Self-pay

## 2024-06-05 ENCOUNTER — Encounter: Payer: Self-pay | Admitting: Physical Therapy

## 2024-06-05 ENCOUNTER — Ambulatory Visit: Admitting: Physical Therapy

## 2024-06-05 DIAGNOSIS — R262 Difficulty in walking, not elsewhere classified: Secondary | ICD-10-CM

## 2024-06-05 DIAGNOSIS — Z9181 History of falling: Secondary | ICD-10-CM

## 2024-06-05 DIAGNOSIS — R2689 Other abnormalities of gait and mobility: Secondary | ICD-10-CM

## 2024-06-05 DIAGNOSIS — M6281 Muscle weakness (generalized): Secondary | ICD-10-CM | POA: Diagnosis not present

## 2024-06-05 DIAGNOSIS — I69354 Hemiplegia and hemiparesis following cerebral infarction affecting left non-dominant side: Secondary | ICD-10-CM

## 2024-06-05 DIAGNOSIS — R296 Repeated falls: Secondary | ICD-10-CM

## 2024-06-05 DIAGNOSIS — M25512 Pain in left shoulder: Secondary | ICD-10-CM

## 2024-06-05 NOTE — Therapy (Signed)
 "  OUTPATIENT PHYSICAL THERAPY NEURO TREATMENT      Patient Name: William [name] MRN: [id number] DOB:Feb 17, 1941, 83 y.o., male Today's Date: 06/05/2024  PCP: [name]  DO REFERRING PROVIDER: same   END OF SESSION:  PT End of Session - 06/05/24 1441     Visit Number 104    Date for Recertification  06/10/24    Authorization Type HTA    PT Start Time 1438    PT Stop Time 1525    PT Time Calculation (min) 47 min    Activity Tolerance Patient tolerated treatment well    Behavior During Therapy [facility] for tasks assessed/performed                    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   HTN (hypertension) 07/19/2015   Hyperlipidemia    Kidney disease, chronic, stage III (GFR 30-59 ml/min) (HCC)    Major neurocognitive disorder 01/09/2014   Mild, related to stroke history   Nocturia    Parkinson disease (HCC)    Renal insufficiency 06/25/2013   S/P carotid endarterectomy    BILATERAL ICA--  PATENT PER DUPLEX  05-19-2012   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 09/26/2017   Right calf   Urinary frequency    Vitamin D  deficiency    Past Surgical History:  Procedure Laterality Date   APPENDECTOMY  AS CHILD   CARDIOVASCULAR STRESS TEST  03-27-2012  DR CRENSHAW   LOW RISK LEXISCAN  STUDY-- PROBABLE NORMAL PERFUSION AND SOFT TISSUE ATTENUATION/  NO ISCHEMIA/ EF 51%   CAROTID ENDARTERECTOMY Bilateral LEFT  11-12-2008  DR GREG HAYES   RIGHT ICA  2006  ([facility])   CHOLECYSTECTOMY N/A 02/23/2022   Procedure: LAPAROSCOPIC CHOLECYSTECTOMY;  Surgeon:  [name], MD;  Location: [facility] OR;  Service: General;  Laterality: N/A;   CYSTOSCOPY W/ RETROGRADES Bilateral 06/22/2021   Procedure: CYSTOSCOPY WITH RETROGRADE PYELOGRAM;  Surgeon: [name], MD;  Location: [facility];  Service: Urology;  Laterality: Bilateral;   CYSTOSCOPY WITH LITHOLAPAXY N/A 02/26/2013   Procedure: CYSTOSCOPY WITH LITHOLAPAXY;  Surgeon: Senior Matilda, MD;  Location: [facility];  Service: Urology;  Laterality: N/A;   ENDOSCOPIC RETROGRADE CHOLANGIOPANCREATOGRAPHY (ERCP) WITH PROPOFOL  N/A 02/22/2022   Procedure: ENDOSCOPIC RETROGRADE CHOLANGIOPANCREATOGRAPHY (ERCP) WITH PROPOFOL ;  Surgeon: [name], MD;  Location: [facility] ENDOSCOPY;  Service: Gastroenterology;  Laterality: N/A;   EYE SURGERY  Jan. 2016   cataract surgery both eyes   INGUINAL HERNIA REPAIR Right 11-08-2006   IR KYPHO EA ADDL LEVEL THORACIC OR LUMBAR  02/12/2021   IR RADIOLOGIST EVAL & MGMT  02/18/2021   MASS EXCISION N/A 03/03/2016   Procedure: EXCISION OF BACK  MASS;  Surgeon: [name], MD;  Location: [facility];  Service: General;  Laterality: N/A;   MOHS SURGERY Left 1/ 2016   [name]-- Basal cell   PROSTATE SURGERY     REMOVAL OF STONES  02/22/2022   Procedure: REMOVAL OF STONES;  Surgeon: [name], MD;  Location: [facility] ENDOSCOPY;  Service: Gastroenterology;;   [name]  02/22/2022   Procedure: [name];  Surgeon: [name], MD;  Location: [facility] ENDOSCOPY;  Service: Gastroenterology;;   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 N/A 06/22/2021   Procedure: TRANSURETHRAL RESECTION OF BLADDER TUMOR;  Surgeon: [name], MD;  Location: [facility];  Service: Urology;  Laterality: N/A;   TRANSURETHRAL RESECTION OF PROSTATE N/A 02/26/2013   Procedure: TRANSURETHRAL RESECTION OF THE PROSTATE WITH GYRUS INSTRUMENTS;  Surgeon: Senior Matilda, MD;  Location: [facility];  Service:  Urology;  Laterality: N/A;   TRANSURETHRAL RESECTION OF PROSTATE N/A 06/22/2021   Procedure: TRANSURETHRAL RESECTION OF THE PROSTATE (TURP);  Surgeon: [name], MD;  Location: [facility];  Service: Urology;  Laterality: N/A;   Patient Active Problem List   Diagnosis Date Noted   Hx of constipation 05/08/2024   Bronchitis 06/16/2023   Impacted cerumen of left ear 06/16/2023   Acute encephalopathy 04/18/2023   Parkinson's disease (HCC) 04/18/2023   History of CVA with residual deficit 04/18/2023   Chronic kidney disease, stage 3b (HCC) 04/18/2023   Dementia with behavioral disturbance (HCC) 04/18/2023   Anemia of chronic disease 04/18/2023   Laceration of right upper extremity 02/24/2023   Dermatitis 02/24/2023   Lung nodules 02/24/2023   Dysuria 08/03/2022   Nonintractable headache 07/01/2022   Bilateral impacted cerumen 06/24/2022   Rash 06/15/2022   Postoperative ileus (HCC) 02/27/2022   Ileus, postoperative (HCC) 02/26/2022   Choledocholithiasis 02/19/2022   DNR (do not resuscitate) 02/19/2022   Left elbow pain 01/26/2022   Mid back pain on left side 01/26/2022   Rib pain 01/26/2022   Acute pain of left shoulder 11/12/2021   Leukocytes in urine 11/12/2021   Urinary frequency 11/12/2021   Thrush 10/08/2021   Hemiplegia, 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   Insomnia    Prediabetes    Acute 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 (HCC)    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   Transaminitis 07/27/2021   UTI (urinary tract infection) 07/27/2021   CVA (cerebral vascular accident) (HCC) 07/27/2021   Fall 07/27/2021   Hyperglycemia 07/27/2021   Cholelithiasis 07/27/2021   Hypoxia 07/27/2021   Nausea and vomiting 07/27/2021   Acute metabolic encephalopathy 07/27/2021   Normocytic anemia 07/27/2021   Chronic back pain 07/27/2021   Malignant neoplasm of overlapping sites of bladder (HCC)  06/22/2021   Closed fracture of first lumbar vertebra with routine healing 02/03/2021   Closed fracture of multiple ribs 11/18/2020   Anxiety 01/29/2020   Leg pain, bilateral 01/29/2020   Ingrown toenail 07/13/2019   Lumbar spondylosis 05/02/2018   Pain in left knee 03/09/2018   Osteoarthritis of left hip 01/16/2018   Trochanteric bursitis of left hip 01/16/2018   Preventative health care 09/26/2017   HTN (hypertension) 07/19/2015   Hyperlipidemia 07/19/2015   Great toe pain 02/11/2014   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   Obesity (BMI 30-39.9) 06/25/2013   Renal insufficiency 06/25/2013   Weakness of left arm 06/25/2013   Sebaceous cyst 03/03/2011   Sprain of lumbar region 07/31/2010   Rib pain, left 08/29/2009   Carotid artery stenosis, asymptomatic, bilateral 05/02/2009   Eczema, atopic 05/31/2008   Vitamin D  deficiency 03/01/2008   BPH (benign prostatic hyperplasia) 08/06/2007   Fasting hyperglycemia 12/21/2006   History of right MCA infarct 2006    ONSET DATE: 06/29/22 REFERRING DIAG:  Diagnosis  I63.9 (ICD-10-CM) - Cerebrovascular accident (CVA), unspecified mechanism (HCC)  W19.XXXD (ICD-10-CM) - Fall, subsequent encounter    THERAPY DIAG:  Muscle weakness (generalized)  Other abnormalities of gait and mobility  Hemiplegia and hemiparesis following cerebral infarction affecting left non-dominant side (HCC)  Difficulty in walking, not elsewhere classified  Repeated falls  Acute pain of left shoulder  History of falling  Rationale for Evaluation and Treatment: Rehabilitation  SUBJECTIVE:                                                                                                                                                                                         SUBJECTIVE STATEMENT:  Pts' wife reports no new issues, working on medications for sundowners.  Is walking some at school and with caregiver at home PERTINENT HISTORY:  William [name] is a 83 year old man with dementia, CKD, HTN, CAD, HLD and history of CVA  (2006, 2023), Parkinson's  PAIN:  Are you having pain? Faces/behavior scale- mild pain BLEs  and back   PRECAUTIONS: Fall  WEIGHT BEARING RESTRICTIONS: No  FALLS: Has patient fallen in last 6 months? No  LIVING ENVIRONMENT: Lives with: lives with their family and lives with their spouse Lives in: House/apartment Stairs: 1 brick high step Has following equipment at home: Environmental Consultant - 2 wheeled, Wheelchair (manual), Shower bench, bed side commode, Grab bars, and hospital bed, sliding pad for car  PLOF: Independent with basic ADLs and Independent with household mobility without device  PATIENT GOALS: Walk around the block with his wife. Be able to use the dining room chair rather than have to use the W/C. Increased I with bed mobility and simple tasks at home like make some coffee or brush his teeth, Step over tub to shower, has bench, but he can't really use it. Up and down the one step to get to back deck. 11/23/22: Patient's wife William [name] updated his goals today: Walk around store (gets distracted), swing a golf club again (thinking of driving range), get up on his own, walk without leaning forward, more recognition of the left side (from CVA), not need gait belt, OT-- be able to get dressed more on his own, navigate the newspaper.  OBJECTIVE:   TREATMENT: 06/05/24 Nustep level 5 x 8 minutes Gait outside with HHA 1/2 parking island then rest and then around building to front LAQ 2.5# 2.5# marches Standing ball toss, ball bounce and ball kicks LArge ball toss Red tband left hip abduction Elbow touches Standing with SPC, 6 toe touches Reaching for cone and then sitting up tall Gait out back door to the car  05/22/24 NuStep x6  mins level 5 UBE x5 mins  Seated physioball push with therapist with multi directional pressures Standing ball pass with BLE involvement and cues to inc use of LLE for returns   Knee flexion extension on physioball x30 Seated hip extension on small ball with UE on knee and axial pressure through LLE x10 reps, 5 sec hold   05/08/24 NuStep L5x32mins  Bike L3 x44mins  UBE L2 x4 mins  LAQ 2# 2x10 Marching 2#  Kicking cones  Standing bouncing ball   05/03/24 Nustep level 4 x 6 minutes Bike level 4 x 5 minutes Gait outside with HHA and gait belt 1/2 parking [place] and then rest, then walk in the grass around the side of the building to the front UBE level 3 x 4 minutes LAQ Marches Standing ball toss and bounce Gait with HHA x 200 feet and then out to the car  04/26/24 Tug 15 seconds Gait outside around 1/2 parking [place] and then through grass to the front door Partial sit ups Cone reaching with trunk rotation and elongation and to the floor, PT call out color and he would reach out and or kick Standing fall catch, bounce or kick fairly rapidly Bike 5 minutes  04/17/24 Nustep level 5 x 5 minutes Bike level 4 x 5 minutes Gait outside with HHA up and down curbs, slopes all the way around the back building a rest at a table in the back, up the hill a standing rest and then to the table int he island to rest again, really struggled coming up the hill. Gait out to the car  04/10/24 Nustep lvl 5 for 6 min LE SciFit lvl 4 for 5 min UBE Bike lvl 3 2 min fwd, 2 min bkwd Ambulating around the clinic to find cones for cardiovascular endurance, functional mobility, and visual scanning.  Car and toilet transfers  04/05/24 Nustep level 5 x 6 minutes STM to the left upper trap Bike level 4 x 6 minutes Standing reaching for numbers called out up to a sequence of 4 Standing ball bouncing Gait outside around parking [place], rest and then around to the front, then 200 feet inside and out to car  03/27/24 Nustep level 5 x 6 minutes Bike level 5 x 5 minutes STM with the tgun to the left upper trap area Passive ROM to the left shoulder all motions to his  tolerance UBE level 4 x 4 minutes Gait outside with HHA 1/2 the parking [place] and then rest and then around to the front of the building on the smooth surface Seated left LAQ, marches Standing ball bouncing Standing HHA side stepping and backward walking  03/20/24 Nustep level 5 x 6 minutes Bike level 4 x 5 minutes UBE level 3 x 4 minutes Gait outside 1/2 around the parking island rest and then through grass back to the front of the building some cues for step length Seated partial sit ups, worked on some trunk mobility trying to get him to lean side to side.  Ball bouncing in sitting and in standing.  Side stepping, gait 2 laps with HHA PROM of the left shoulder  03/15/2024 Nustep level 5 x 5 minutes Bike level 4 x 5 minutes Dribbling ball Partial sit ups Ball kicks Walked out side with HHA 1/2 way around parking [place] and then rest and then from there to the front door through some deep grass, this was very difficult for him, needed a lot of cues to take bigger steps and  needed more assist, once we did this he had a hard time getting his feet and steps back. Did 240 feet HHA walking in the clinic and then to the car after this to get the stepping pattern back  03/08/24 NuStep L5x  UBE L3 x9mins each way- hard time keeping hand on bar today AB isometric 2x10 Ball squeezes 2x10 Standing ball dribbling Kicking different color cones Dribbling ball back and forth with wife   02/28/24 NuStep L5x16mins UBE L2 x34mins each way  Seated PWR moves- holding big red ball touching floor and reaching overhead, rotations holding big red ball Punches with 2# weights  LAQ 5# 2x12 Modified sit ups 2x10  PATIENT EDUCATION: Education details: none new 02/16/23 Person educated: Patient and Spouse Education method: Explanation, Demonstration, Tactile cues, and Verbal cues Education comprehension: verbalized understanding, returned demonstration, verbal cues required, tactile cues required,  and needs further education   ------------------------------------------------------------------------------------------------------- (Measures in this section from initial evaluation unless otherwise noted) DIAGNOSTIC FINDINGS:  MRI 2021 with degenerative changes in lumbar spine, L3-4 R subarticular stenosis COGNITION: Overall cognitive status: History of cognitive impairments - at baseline SENSATION: Not tested COORDINATION: Moderately impaired BLE MUSCLE LENGTH: Hamstrings: severely restricted B Thomas test: Severely restricted B. POSTURE: rounded shoulders, forward head, increased thoracic kyphosis, posterior pelvic tilt, and flexed trunk  LOWER EXTREMITY ROM:   BLE extremely stiff throughout, limited hip ROM in all planes, B knees limited in extension. LOWER EXTREMITY MMT:  3+/5 throughout. Unable to determine accuracy of MMT due to cognition. Functionally demosntrates poor coordinated activation and poor muscular endurance. BED MOBILITY: Min A for Supine<> Sit. Patient was using a ladder in his hospital bed and could move MI. TRANSFERS: Min A, mod VC, tends to lean back.  11/02/22 independent with use of hands on arm chair with a couple of tries CURB: Min A-Has one step out to his deck. STAIRS: N/A  GAIT: Gait pattern: step to pattern, decreased arm swing- Right, decreased arm swing- Left, decreased step length- Right, decreased step length- Left, shuffling, festinating, trunk flexed, narrow BOS, poor foot clearance- Right, and poor foot clearance- Left Distance walked: 72' Assistive device utilized: Environmental Consultant - 2 wheeled Level of assistance: Occasional min A Comments: mod VC to stay close to walker, take longer steps. FUNCTIONAL TESTS:  5 times sit to stand: 38.23  09/29/22  could not performs 5XSTS, 11/02/22 = 47 seconds with cues to use arms, 07/21/23 really struggled today 55 seconds Timed up and go (TUG): 47.91 with RW, 07/21/22 TUG 29  seconds without device, 09/29/22 25 seconds,  11/02/22 TUG 20 seconds, 07/21/23 = 26 seconds no device SBA                                                                     PATIENT EDUCATION: Education details: Progress towards goals, POC Person educated: Patient and Spouse Education method: Programmer, Multimedia, Facilities Manager, and Verbal cues  Education comprehension: verbalized understanding, returned demonstration, verbal cues required, and needs further education  HOME EXERCISE PROGRAM: PWR! moves  GOALS:  SHORT TERM GOALS: Target date: 01/25/24   Pt will perform HEP to maintain lower body strength, flexibility, posture with wife's assistance. Baseline:  Goal status: met 04/12/23  2. Pt will perform sit to stand,  8 of 10 reps with BUE support, with supervision, to maintain safety with transfers.  Baseline: min guard  Goal status: 02/07/23-CGA from elevated surface and max VC to lean forward, ongoing  continues to need a lot of cues for hand placement and to lean forward, tends to lean backward, needs cues and some assistance 02/28/24; 04/11/24 - requires CGA-MinA with Sit>Stand, can transfer with SBA and VC's 04/26/24  LONG TERM GOALS: Target date: 04/10/24   Patient will perform HEP progression to maintain lower body strength, flexibility, posture with wife's assistance. Baseline:  Goal status: has been able to walk with wife multiple times a day 03/27/24; pt states compliance to HEP and walking to and from the mailbox w wife every day 04/11/24  2.  Pt will ambulate 150-175 ft in 2 MWT with min guard assist, to preserve functional mobility within the home.  Baseline: Goal status:  met 08/25/23  3. Wife will verbalize tips for fall prevention education to minimize fall frequency Baseline:  Goal status:progressing and doing well 4.  Patient will be able to toilet without freezing for over a 4 week period met 11/15/23  5.  Patient will be able to get out of bed from supine with wife assist minimal Assist,met  10/06/23   ASSESSMENT:  CLINICAL IMPRESSION: Todays walk was one of the best we have done, great step length, less cues for steps.  A few times when getting up was leaning back.   OBJECTIVE IMPAIRMENTS: Abnormal gait, decreased activity tolerance, decreased balance, decreased cognition, decreased coordination, decreased endurance, decreased mobility, difficulty walking, decreased ROM, decreased strength, decreased safety awareness, impaired flexibility, impaired UE functional use, improper body mechanics, and postural dysfunction.   PLAN:  PT FREQUENCY: 1x/week  PT DURATION: 12 weeks  PLANNED INTERVENTIONS: Therapeutic exercises, Therapeutic activity, Neuromuscular re-education, Balance training, Gait training, Patient/Family education, Self Care, Joint mobilization, Stair training, Moist heat, and Manual therapy  PLAN FOR NEXT SESSION:   Will continue to work with patient to help him and his wife stay living at home and with less difficulty   Patient Details  Name: William [name] MRN: [id number] Date of Birth: 16-Aug-1940 Referring Provider:  [name], DO  Stann Ohara PT, DPT, CLT, CES 06/05/2024 2:42 PM   "

## 2024-06-08 ENCOUNTER — Other Ambulatory Visit: Payer: Self-pay

## 2024-06-08 ENCOUNTER — Other Ambulatory Visit (HOSPITAL_BASED_OUTPATIENT_CLINIC_OR_DEPARTMENT_OTHER): Payer: Self-pay

## 2024-06-08 ENCOUNTER — Encounter: Payer: Self-pay | Admitting: Neurology

## 2024-06-11 ENCOUNTER — Other Ambulatory Visit (HOSPITAL_BASED_OUTPATIENT_CLINIC_OR_DEPARTMENT_OTHER): Payer: Self-pay

## 2024-06-12 ENCOUNTER — Encounter: Payer: Self-pay | Admitting: Physical Therapy

## 2024-06-12 ENCOUNTER — Ambulatory Visit: Admitting: Physical Therapy

## 2024-06-12 DIAGNOSIS — M25512 Pain in left shoulder: Secondary | ICD-10-CM

## 2024-06-12 DIAGNOSIS — R2689 Other abnormalities of gait and mobility: Secondary | ICD-10-CM

## 2024-06-12 DIAGNOSIS — Z9181 History of falling: Secondary | ICD-10-CM

## 2024-06-12 DIAGNOSIS — I69354 Hemiplegia and hemiparesis following cerebral infarction affecting left non-dominant side: Secondary | ICD-10-CM

## 2024-06-12 DIAGNOSIS — M6281 Muscle weakness (generalized): Secondary | ICD-10-CM

## 2024-06-12 DIAGNOSIS — R262 Difficulty in walking, not elsewhere classified: Secondary | ICD-10-CM

## 2024-06-12 DIAGNOSIS — R296 Repeated falls: Secondary | ICD-10-CM

## 2024-06-12 NOTE — Progress Notes (Unsigned)
 "  NEUROLOGY FOLLOW UP OFFICE NOTE  William [name] [id number]  Assessment/Plan:     1.  Parkinson's disease 2.   Major vascular neurocognitive disorder, but cannot rule out underlying dementia secondary to Parkinson's disease as well 3.   Recurrent spells of altered awareness - unclear if seizures or behavioral.  He is already on carbamazepine  for mood.      Carbidopa -levodopa  IR 25/100mg  to 1.5 tablest three times daily.  I query if trouble sitting down on the toilet in the morning may be freezing.  Therefore, I recommended giving the morning dose about 30 minutes before getting out of bed.  May take with crackers or bread.  Regarding sundowning, I think increasing carbamazepine  would be an appropriate course of action rather than increasing a benzodiazepine or  Continue home exercises and maintenance PT once a week Regarding altered awareness, continue to monitor.  Plan may be to increase carbamazepine  anyway.  As long as they remain brief and infrequent, I wouldn't start a new medication or follow up with another EEG.   Wife plans to follow up with PCP regarding possible UTI Follow up in 6 months.  Total time spent in chart and face to face with patient:  49 minutes.     Subjective:  William [name] is a 83 year old man with dementia, CKD, HTN, CAD, HLD and history of CVA who follows up for vascular dementia, cerebrovascular disease with history of stroke and parkinsonism.  UPDATE: Current medications:  Carbidopa -levodopa  IR 25/100mg  1.5 tablet three times daily (9-10 AM, 12-2 PM and 5-6 PM), ASA 81mg , Plavix  75mg ; Norvasc , hydralazine , fenofibrate  160mg , carbamazepine  5mL (100mg ) morning and afternoon and 10mL (200mg ) at night; amantadine  100mg  daily, lorazepam  PRN; tizanidine  4mg  Q6h PRN, hydralazine , trazodone  75mg  at bedtime  ***.    He has continued to have a decline.  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  He has some residual left sided weakness and facial weakness.  He underwent left carotid endarterectomy in 2010 for asymptomatic stenosis.   Beginning around 2013, he and his wife have noted a gradual progression and cognitive deficits. Onset correlated during a period when he was experiencing dizziness and syncope secondary to orthostatic hypotension. At that time, he was dehydrated and not drinking much fluids. He was instructed to start increasing his fluid intake as well as his sodium intake. Now, he tries to drink 3 bottles of water  per day. He particularly notes confusion regarding how to perform certain everyday tasks. For example, it takes him a long time to unload the dishwasher because he has trouble figuring out where to put the dishes. He use to enjoy cooking and preparing meals. Now he has difficulty cooking and can only see things up in the microwave. He also has been experiencing dressing apraxia. Sometimes he will put both his feet into one leg of his pants. Other times, he has difficulty buttoning his shirts. He denies any language dysfunction such as difficulty understanding other people or other people not understanding him. He has no trouble reading or writing. His short-term and long-term memory are pretty good. He denies any difficulty with face recognition. He denies hallucinations or delusions. He has not had any change in his personality or behavior.  He still drives but very seldom and only locally. He has not had any problems with accidents or near accidents, but he says he drives slowly because he is very nervous and cautious. He does have history of anxiety and often shakes when he gets nervous. He denies any family history of dementia. However, there is a psychiatric history with his mother  and sister.  He denies history of alcohol abuse or drug use.   He underwent neuropsychological testing on 10/25/13 with Dr. Stephania Hun at [facility].  Testing suggested mild dementia due to his stroke, but also underlying neurodegenerative process.  Memory and left hemispheric medial temporal functions were intact.  Findings showed deficits in visual processing and executive dysfunction.  Onset of visual-spatial deficits and apraxia may suggest occipital lobe involvement, such as due to an Alzheimers variant called posterior cortical atrophy.  MRI from May 2015 revealed global atrophy but it does not seem more prominent in the occipital lobes.  He developed increased confusion in February 2021.  At physical therapy, he was noted to have increased difficulty using his left side (affected side from his stroke).  He had difficulty performing finger-thumb tapping and had trouble gripping the handle of the exercise machine with his left hand.  He also has had acute cognitive changes.  He started having trouble dialing his daughter's phone number which is new.  MRI of brain without contrast performed on 08/15/2019 was personally reviewed and showed remote large right hemispheric stroke and chronic small vessel ischemic changes, stable compared to prior MRI from 2015, no acute intracranial abnormality.   He underwent repeat neuropsychological testing on 11/29/2019 which demonstrated findings primarily consistent with major vascular neurocognitive disorder, relatively stable compared to prior testing in 2015.  Underlying neurodegenerative disorder could not be ruled out but clinically has been stable.    In February 2023, he became more confused.  Found to have UTI as well as acute infarct in left basal ganglia and corona radiata as well as severe distal left M1 MCA stenosis and moderate right ICA stenosis.  Discharged on ASA and Plavix  for 3 months followed by Plavix  monotherapy.  Following discharge, continued to be  confused.  Mistakes wife with his ex-wife.  Incontinent.  Baseline hemineglect worse.    DaT scan on 08/05/2022 was equivocal - revealed marked decreased activity in the left striatum, however it correlates with large left basal ganglia infarct as seen on comparison CT.  On 04/19/2023, he was admitted to [facility] with slurred speech, dizziness, diaphoresis and appeared to be leaning to the right.  Exam in hospital revealed chronic left sided facial droop but no new focal abnormalities.  Found to have a UTI, which has been cause for confusion in the past and given dose of IV Rocephin  and discharged on Keflex .  MRI of brain showed known multiple old infarcts in the right MCA territory but was negative for acute stroke or other acute abnormality.  EEG showed right frontotemporal slowing secondary to prior stroke but no epileptiform discharges or electrographic seizures.  B12 level was low at 203 and started on supplementation.  He had another episode of confusion due to refractory UTI on 11/16.    Prior medication:  Aricept  (diarrhea, vivid dreams); galantamine  (nausea)  Headache: In February 2022, he reported new moderate non-throbbing headache back of head every night.  Ongoing for two months.  No neck pain.  It occurs in the evening while sitting talking or watching TV.  Treats with Tylenol  almost everyday.  Lasts a half hour.  No change in medication just prior to onset.  Similar headache when he had his stroke.  MRI of brain without contrast on 08/14/2020 showed no new acute/subacute intracranial abnormality.  Headaches resolved.  Lumbar spondylosis with radiculopathy: Prior to COVID, he was going to the gym and seeing a personal trainer but it stopped once the pandemic hit.  Since the pandemic, he has been sedentary.  He walks the dog once or twice a week.  He started having bilateral leg pain later that year in 2020.  No associated back pain.  No shooting pain.  It is described as an aching in his  calves.  No associated numbness.  He also reports weakness.  When he walks, he has trouble picking up his legs.  He seems to shuffle his feet.  When he gets up to stand, he feels like he can fall forward.  He had one fall while standing at the toilet and he bent forward to spit in toilet.  No change in bowel habit.  He was having urinary issues so he was taken off of Flomax .    Vascular US  ABI from 12/08/18 showed abnormal toe-brachial index in both lower extremities but otherwise unremarkable.  MRI of cervical spine without contrast from 03/10/2019 showed some cervical degenerative disc disease and spondylosis with mild foraminal narrowing but no spinal stenosis or compressive myelopathy.  He does have history of low back pain and lumbar spondylosis.  Due to ongoing unsteady gait with bilateral leg pain, MRI of lumbar spine was performed on 11/14/2019, which was personally reviewed and demonstrated degenerative changes stable compared to 2012 with L3-4 right subarticular stenosis.  He has had mixed response to epidural injections.    Imaging/testing: 04/19/23 MRI BRAIN WO:  1. No acute intracranial abnormality. 2. Multiple old infarcts mostly within the right MCA territory. 08/05/22 DaTscan :   1. Equivocal exam. Marked decreased activity in the LEFT striatum;however, there is a large LEFT basal ganglia infarction on comparison CT (described above). 2. Normal RIGHT striatum. 01/30/20 Carotid doppler:  1-39% bilateral ICA stenosis. 10/16/13 LABS:  B12 720, TSH 1.668 10/19/13 MRI BRAIN WO:  stable remote large right MCA territory infarct.  Chronic microvascular ischemic changes. 05/21/13 LABS:  Hgb A1c 6 05/17/13 Carotid doppler: bilateral 1-39% stenosis of the right proximal ICA and no stenosis of the left ICA. 03/11/12 MRI Brain wo contrast:  Remote large right MCA infarct.   PAST MEDICAL HISTORY: Past Medical History:  Diagnosis Date   Arthritis    low back   Basal cell carcinoma of face 12/26/2014    Mohs surgery jan 2016    Bladder stone    BPH (benign prostatic hyperplasia) 08/06/2007   Chronic kidney disease 2014   Stage III   Closed fracture of fifth metacarpal bone 05/15/2015   Eczema    Fasting hyperglycemia 12/21/2006   GERD (gastroesophageal reflux disease)    History of right MCA infarct 06/14/2004   HTN (hypertension) 07/19/2015   Hyperlipidemia    Kidney disease, chronic, stage III (GFR 30-59 ml/min) (HCC)    Major neurocognitive disorder 01/09/2014   Mild, related to stroke history   Nocturia    Parkinson disease (HCC)    Renal insufficiency 06/25/2013   S/P carotid endarterectomy    BILATERAL ICA--  PATENT PER DUPLEX  05-19-2012   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     amantadine  (SYMMETREL ) 100 MG capsule Take 1 capsule (100 mg total) by mouth daily. 90 capsule 1   amLODipine  (NORVASC ) 10 MG tablet Take 1 tablet (10 mg total) by mouth every morning. 90 tablet 1   bacitracin  ointment Apply 1 Application topically 2 (two) times daily. 120 g 0   carBAMazepine  (TEGRETOL ) 100 MG/5ML suspension Take 5 mLs (100 mg total) by mouth in the morning AND 5 mLs (100 mg total) daily at 2 PM AND 10 mLs (200 mg total) at bedtime. 600 mL 5   carbidopa -levodopa  (SINEMET  IR) 25-100 MG tablet Take 1.5 tablets by mouth 3 (three) times daily. 165 tablet 5   cephALEXin  (KEFLEX ) 500 MG capsule Take 1 capsule (500 mg total) by mouth 2 (two) times daily. 20 capsule 0   Cholecalciferol  (VITAMIN D3) 50 MCG (2000 UT) CHEW Chew 1 tablet by mouth daily.     clobetasol  cream (TEMOVATE ) 0.05 % Apply 1 Application to affected areas topically up to 2 (two) times daily as  needed. Do not apply to face, groin, or under arms 60 g 3   clopidogrel  (PLAVIX ) 75 MG tablet Take 1 tablet (75 mg total) by mouth daily. 90 tablet 1   cyanocobalamin  (VITAMIN B12) 1000 MCG tablet Take 1 tablet (1,000 mcg total) by mouth daily. 90 tablet 1   EPINEPHrine  (EPIPEN  2-PAK) 0.3 mg/0.3 mL IJ SOAJ injection Inject 0.3 mg into the muscle as needed for anaphylaxis. 2 each 1   ferrous sulfate  325 (65 FE) MG EC tablet Take 325 mg by mouth 2 (two) times daily. *Crushed*     fluticasone  (CUTIVATE ) 0.05 % cream 1 application  at bedtime as needed (psoriasis).  3   glycerin  adult 2 g suppository Place 1 suppository rectally as needed for constipation. 12 suppository 0   hydrALAZINE  (APRESOLINE ) 25 MG tablet Take 1 tablet (25 mg total) by mouth 3 (three) times daily. 270 tablet 1   LORazepam  (ATIVAN ) 1 MG tablet Take 1/4 tablet by mouth in the morning, 1/2 tablet by mouth at lunch and 1 tablet by mouth at bedtime 60 tablet 2   LORazepam  (ATIVAN ) 1 MG tablet Take 1/4 tablet (0.25 mg total) by mouth in the morning AND 1/2 tablet (0.5 mg total) daily in the afternoon AND 1 tablet (1 mg total) at bedtime. 60 tablet 4   LORazepam  (ATIVAN ) 1 MG tablet Take 1 tablet (1 mg total) by mouth 3 (three) times daily. Take in the morning, at 3pm, and at bedtime 90 tablet 4   magnesium  hydroxide (MILK OF MAGNESIA) 400 MG/5ML suspension Take 60 mLs by mouth daily as needed for moderate constipation. 355 mL 0   Multiple Vitamins-Minerals (ADULT ONE DAILY GUMMIES) CHEW Chew 1 tablet by mouth every morning.     NONFORMULARY OR COMPOUNDED ITEM Transport chair  dx parkinsons, hx falls 1 each 0   pantoprazole  (PROTONIX ) 40 MG tablet Take 1 tablet (40 mg total) by mouth daily. 90 tablet 1   polyethylene glycol (MIRALAX  / GLYCOLAX ) 17 g packet Take 17 g by mouth daily. (Patient taking differently: Take 17 g by mouth every other day.) 14 each 0   tamsulosin  (FLOMAX ) 0.4 MG CAPS capsule Take 1 capsule (0.4 mg total) by mouth  daily after supper. (Patient taking differently: Take 0.4 mg by mouth at bedtime.) 90 capsule 1   tamsulosin  (FLOMAX ) 0.4 MG CAPS capsule Take 1 capsule (0.4 mg total) by mouth at bedtime. 90 capsule 3   traZODone  (DESYREL ) 150 MG tablet Take 0.5 tablets (75 mg total) by mouth at bedtime. *Crushed* 20 tablet 6   triamcinolone  cream (KENALOG ) 0.1 % Apply 1 application topically 2 (two) times daily. 30 g 0   Current Facility-Administered Medications on File Prior to Visit  Medication Dose Route Frequency Provider Last Rate Last Admin   gemcitabine  (GEMZAR ) chemo syringe for bladder instillation 2,000 mg  2,000 mg Bladder Instillation Once Dahlstedt, Stephen, MD        ALLERGIES: Allergies  Allergen Reactions   Bee Venom Anaphylaxis   Strawberry Extract Hives   Latex Itching   Zetia  [Ezetimibe ] Other (See Comments)    Intolerance    Adhesive [Tape] Other (See Comments)    blisters   Statins Other (See Comments)    myalgias    FAMILY HISTORY: Family History  Problem Relation Age of Onset   Heart disease Mother        CHF   Bipolar disorder Mother    Heart disease Father        CHF      Objective:  *** General: No acute distress.  Patient appears frail  Head:  Normocephalic/atraumatic Eyes:  Fundi examined but not visualized Neck: supple, no paraspinal tenderness, full range of motion Heart:  Regular rate and rhythm Neurological Exam:  Alert and oriented. Hypomimia.  Limited tracking with eye movements.  Trace left sided facial weakness.  Hypophonia.  Increased tone in wrists and elbows, particularly left upper extremity.  Muscle strength 3/5 left upper extremity, otherwise 4/5 throughout.  Resting tremor in hands.  Sensation to light touch intact.  Deep tendon reflexes 3+ throughout.  Pushed up from wheelchair to stand.  No freezing.  Quick gait with short stride.  En bloc turn.  Reduced left arm swing. ***    Juliene Dunnings, DO  CC: Jamee Antonio Meth, DO      "

## 2024-06-12 NOTE — Therapy (Signed)
 "  OUTPATIENT PHYSICAL THERAPY NEURO TREATMENT      Patient Name: William [name] MRN: [id number] DOB:1941-01-18, 83 y.o., male Today's Date: 06/12/2024  PCP: [name]  DO REFERRING PROVIDER: same   END OF SESSION:  PT End of Session - 06/12/24 1533     Visit Number [id number]    Date for Recertification  08/11/24    Authorization Type HTA    PT Start Time 1530    PT Stop Time 1614    PT Time Calculation (min) 44 min    Equipment Utilized During Treatment Gait belt    Activity Tolerance Patient tolerated treatment well    Behavior During Therapy WFL for tasks assessed/performed                    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   HTN (hypertension) 07/19/2015   Hyperlipidemia    Kidney disease, chronic, stage III (GFR 30-59 ml/min) (HCC)    Major neurocognitive disorder 01/09/2014   Mild, related to stroke history   Nocturia    Parkinson disease (HCC)    Renal insufficiency 06/25/2013   S/P carotid endarterectomy    BILATERAL ICA--  PATENT PER DUPLEX  05-19-2012   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/26/2017   Right calf   Urinary frequency    Vitamin D  deficiency    Past Surgical History:  Procedure Laterality Date   APPENDECTOMY  AS CHILD   CARDIOVASCULAR STRESS TEST  03-27-2012  DR CRENSHAW   LOW RISK LEXISCAN  STUDY-- PROBABLE NORMAL PERFUSION AND SOFT TISSUE ATTENUATION/  NO ISCHEMIA/ EF 51%   CAROTID ENDARTERECTOMY Bilateral LEFT  11-12-2008  DR GREG HAYES   RIGHT ICA  2006  ([facility])   CHOLECYSTECTOMY N/A 02/23/2022    Procedure: LAPAROSCOPIC CHOLECYSTECTOMY;  Surgeon: [name], MD;  Location: [facility];  Service: General;  Laterality: N/A;   CYSTOSCOPY W/ RETROGRADES Bilateral 06/22/2021   Procedure: CYSTOSCOPY WITH RETROGRADE PYELOGRAM;  Surgeon: [name], MD;  Location: [facility] [place];  Service: Urology;  Laterality: Bilateral;   CYSTOSCOPY WITH LITHOLAPAXY N/A 02/26/2013   Procedure: CYSTOSCOPY WITH LITHOLAPAXY;  Surgeon: Senior Matilda, MD;  Location: [facility];  Service: Urology;  Laterality: N/A;   ENDOSCOPIC RETROGRADE CHOLANGIOPANCREATOGRAPHY (ERCP) WITH PROPOFOL  N/A 02/22/2022   Procedure: ENDOSCOPIC RETROGRADE CHOLANGIOPANCREATOGRAPHY (ERCP) WITH PROPOFOL ;  Surgeon: [name], MD;  Location: [facility] ENDOSCOPY;  Service: Gastroenterology;  Laterality: N/A;   EYE SURGERY  Jan. 2016   cataract surgery both eyes   INGUINAL HERNIA REPAIR Right 11-08-2006   IR KYPHO EA ADDL LEVEL THORACIC OR LUMBAR  02/12/2021   IR RADIOLOGIST EVAL & MGMT  02/18/2021   MASS EXCISION N/A 03/03/2016   Procedure: EXCISION OF BACK  MASS;  Surgeon: [name], MD;  Location: [facility];  Service: General;  Laterality: N/A;   MOHS SURGERY Left 1/ 2016   [name]-- Basal cell   PROSTATE SURGERY     REMOVAL OF  STONES  02/22/2022   Procedure: REMOVAL OF STONES;  Surgeon: [name], MD;  Location: [facility] ENDOSCOPY;  Service: Gastroenterology;;   [name]  02/22/2022   Procedure: [name];  Surgeon: [name], MD;  Location[facility] ENDOSCOPY;  Service: Gastroenterology;;   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 N/A 06/22/2021   Procedure: TRANSURETHRAL RESECTION OF BLADDER TUMOR;  Surgeon: [name], MD;  Location: [facility];  Service: Urology;  Laterality: N/A;   TRANSURETHRAL RESECTION OF PROSTATE N/A 02/26/2013   Procedure: TRANSURETHRAL RESECTION OF THE PROSTATE WITH GYRUS INSTRUMENTS;  Surgeon: Senior Matilda, MD;   Location: [facility];  Service: Urology;  Laterality: N/A;   TRANSURETHRAL RESECTION OF PROSTATE N/A 06/22/2021   Procedure: TRANSURETHRAL RESECTION OF THE PROSTATE (TURP);  Surgeon: [name], MD;  Location: [facility];  Service: Urology;  Laterality: N/A;   Patient Active Problem List   Diagnosis Date Noted   Hx of constipation 05/08/2024   Bronchitis 06/16/2023   Impacted cerumen of left ear 06/16/2023   Acute encephalopathy 04/18/2023   Parkinson's disease (HCC) 04/18/2023   History of CVA with residual deficit 04/18/2023   Chronic kidney disease, stage 3b (HCC) 04/18/2023   Dementia with behavioral disturbance (HCC) 04/18/2023   Anemia of chronic disease 04/18/2023   Laceration of right upper extremity 02/24/2023   Dermatitis 02/24/2023   Lung nodules 02/24/2023   Dysuria 08/03/2022   Nonintractable headache 07/01/2022   Bilateral impacted cerumen 06/24/2022   Rash 06/15/2022   Postoperative ileus (HCC) 02/27/2022   Ileus, postoperative (HCC) 02/26/2022   Choledocholithiasis 02/19/2022   DNR (do not resuscitate) 02/19/2022   Left elbow pain 01/26/2022   Mid back pain on left side 01/26/2022   Rib pain 01/26/2022   Acute pain of left shoulder 11/12/2021   Leukocytes in urine 11/12/2021   Urinary frequency 11/12/2021   Thrush 10/08/2021   Hemiplegia, 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   Insomnia    Prediabetes    Acute 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 (HCC)    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   Transaminitis 07/27/2021   UTI (urinary tract infection) 07/27/2021   CVA (cerebral vascular accident) (HCC) 07/27/2021   Fall 07/27/2021   Hyperglycemia 07/27/2021   Cholelithiasis 07/27/2021   Hypoxia 07/27/2021   Nausea and vomiting 07/27/2021   Acute metabolic encephalopathy 07/27/2021   Normocytic anemia 07/27/2021   Chronic back pain 07/27/2021   Malignant  neoplasm of overlapping sites of bladder (HCC) 06/22/2021   Closed fracture of first lumbar vertebra with routine healing 02/03/2021   Closed fracture of multiple ribs 11/18/2020   Anxiety 01/29/2020   Leg pain, bilateral 01/29/2020   Ingrown toenail 07/13/2019   Lumbar spondylosis 05/02/2018   Pain in left knee 03/09/2018   Osteoarthritis of left hip 01/16/2018   Trochanteric bursitis of left hip 01/16/2018   Preventative health care 09/26/2017   HTN (hypertension) 07/19/2015   Hyperlipidemia 07/19/2015   Great toe pain 02/11/2014   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   Obesity (BMI 30-39.9) 06/25/2013   Renal insufficiency 06/25/2013   Weakness of left arm 06/25/2013   Sebaceous cyst 03/03/2011   Sprain of lumbar region 07/31/2010   Rib pain, left 08/29/2009   Carotid artery stenosis, asymptomatic, bilateral 05/02/2009   Eczema, atopic 05/31/2008   Vitamin D  deficiency 03/01/2008   BPH (benign prostatic hyperplasia) 08/06/2007   Fasting hyperglycemia 12/21/2006   History of right MCA infarct 2006  ONSET DATE: 06/29/22 REFERRING DIAG:  Diagnosis  I63.9 (ICD-10-CM) - Cerebrovascular accident (CVA), unspecified mechanism (HCC)  W19.XXXD (ICD-10-CM) - Fall, subsequent encounter    THERAPY DIAG:  Muscle weakness (generalized)  Other abnormalities of gait and mobility  Hemiplegia and hemiparesis following cerebral infarction affecting left non-dominant side (HCC)  Difficulty in walking, not elsewhere classified  Repeated falls  Acute pain of left shoulder  History of falling  Rationale for Evaluation and Treatment: Rehabilitation  SUBJECTIVE:                                                                                                                                                                                         SUBJECTIVE STATEMENT:  Pts' wife reports no new issues, working on medications for sundowners.  Octaviano comes in and his voice is  louder and he did not stutter step or shuffle coming in the doors  PERTINENT HISTORY:  ROSS BENDER is a 83 year old man with dementia, CKD, HTN, CAD, HLD and history of CVA  (2006, 2023), Parkinson's  PAIN:  Are you having pain? Faces/behavior scale- mild pain BLEs  and back   PRECAUTIONS: Fall  WEIGHT BEARING RESTRICTIONS: No  FALLS: Has patient fallen in last 6 months? No  LIVING ENVIRONMENT: Lives with: lives with their family and lives with their spouse Lives in: House/apartment Stairs: 1 brick high step Has following equipment at home: Environmental Consultant - 2 wheeled, Wheelchair (manual), Shower bench, bed side commode, Grab bars, and hospital bed, sliding pad for car  PLOF: Independent with basic ADLs and Independent with household mobility without device  PATIENT GOALS: Walk around the block with his wife. Be able to use the dining room chair rather than have to use the W/C. Increased I with bed mobility and simple tasks at home like make some coffee or brush his teeth, Step over tub to shower, has bench, but he can't really use it. Up and down the one step to get to back deck. 11/23/22: Patient's wife Devere updated his goals today: Walk around store (gets distracted), swing a golf club again (thinking of driving range), get up on his own, walk without leaning forward, more recognition of the left side (from CVA), not need gait belt, OT-- be able to get dressed more on his own, navigate the newspaper.  OBJECTIVE:   TREATMENT: 06/12/24 Nustep level 5 x 8 minutes TUG 16 seconds Bike level 4 x 5 minutes Ball toss  Ball bounce Ball kicks Side stepping HHA Backward walk HHA HHA walking 400 feet HHA walking 400 feet, more shuffling and needed more assist  06/05/24 Nustep level 5 x 8  minutes Gait outside with HHA 1/2 parking island then rest and then around building to front LAQ 2.5# 2.5# marches Standing ball toss, ball bounce and ball kicks LArge ball toss Red tband left hip  abduction Elbow touches Standing with SPC, 6 toe touches Reaching for cone and then sitting up tall Gait out back door to the car  05/22/24 NuStep x6 mins level 5 UBE x5 mins  Seated physioball push with therapist with multi directional pressures Standing ball pass with BLE involvement and cues to inc use of LLE for returns  Knee flexion extension on physioball x30 Seated hip extension on small ball with UE on knee and axial pressure through LLE x10 reps, 5 sec hold   05/08/24 NuStep L5x71mins  Bike L3 x3mins  UBE L2 x4 mins  LAQ 2# 2x10 Marching 2#  Kicking cones  Standing bouncing ball   05/03/24 Nustep level 4 x 6 minutes Bike level 4 x 5 minutes Gait outside with HHA and gait belt 1/2 parking [place] and then rest, then walk in the grass around the side of the building to the front UBE level 3 x 4 minutes LAQ Marches Standing ball toss and bounce Gait with HHA x 200 feet and then out to the car  04/26/24 Tug 15 seconds Gait outside around 1/2 parking [place] and then through grass to the front door Partial sit ups Cone reaching with trunk rotation and elongation and to the floor, PT call out color and he would reach out and or kick Standing fall catch, bounce or kick fairly rapidly Bike 5 minutes  04/17/24 Nustep level 5 x 5 minutes Bike level 4 x 5 minutes Gait outside with HHA up and down curbs, slopes all the way around the back building a rest at a table in the back, up the hill a standing rest and then to the table int he island to rest again, really struggled coming up the hill. Gait out to the car  04/10/24 Nustep lvl 5 for 6 min LE SciFit lvl 4 for 5 min UBE Bike lvl 3 2 min fwd, 2 min bkwd Ambulating around the clinic to find cones for cardiovascular endurance, functional mobility, and visual scanning.  Car and toilet transfers  04/05/24 Nustep level 5 x 6 minutes STM to the left upper trap Bike level 4 x 6 minutes Standing reaching for numbers  called out up to a sequence of 4 Standing ball bouncing Gait outside around parking [place], rest and then around to the front, then 200 feet inside and out to car  03/27/24 Nustep level 5 x 6 minutes Bike level 5 x 5 minutes STM with the tgun to the left upper trap area Passive ROM to the left shoulder all motions to his tolerance UBE level 4 x 4 minutes Gait outside with HHA 1/2 the parking [place] and then rest and then around to the front of the building on the smooth surface Seated left LAQ, marches Standing ball bouncing Standing HHA side stepping and backward walking  03/20/24 Nustep level 5 x 6 minutes Bike level 4 x 5 minutes UBE level 3 x 4 minutes Gait outside 1/2 around the parking island rest and then through grass back to the front of the building some cues for step length Seated partial sit ups, worked on some trunk mobility trying to get him to lean side to side.  Ball bouncing in sitting and in standing.  Side stepping, gait 2 laps with HHA PROM of the left  shoulder  03/15/2024 Nustep level 5 x 5 minutes Bike level 4 x 5 minutes Dribbling ball Partial sit ups Ball kicks Walked out side with HHA 1/2 way around parking [place] and then rest and then from there to the front door through some deep grass, this was very difficult for him, needed a lot of cues to take bigger steps and needed more assist, once we did this he had a hard time getting his feet and steps back. Did 240 feet HHA walking in the clinic and then to the car after this to get the stepping pattern back  03/08/24 NuStep L5x  UBE L3 x106mins each way- hard time keeping hand on bar today AB isometric 2x10 Ball squeezes 2x10 Standing ball dribbling Kicking different color cones Dribbling ball back and forth with wife   02/28/24 NuStep L5x64mins UBE L2 x80mins each way  Seated PWR moves- holding big red ball touching floor and reaching overhead, rotations holding big red ball Punches with 2# weights   LAQ 5# 2x12 Modified sit ups 2x10  PATIENT EDUCATION: Education details: none new 02/16/23 Person educated: Patient and Spouse Education method: Explanation, Demonstration, Tactile cues, and Verbal cues Education comprehension: verbalized understanding, returned demonstration, verbal cues required, tactile cues required, and needs further education   ------------------------------------------------------------------------------------------------------- (Measures in this section from initial evaluation unless otherwise noted) DIAGNOSTIC FINDINGS:  MRI 2021 with degenerative changes in lumbar spine, L3-4 R subarticular stenosis COGNITION: Overall cognitive status: History of cognitive impairments - at baseline SENSATION: Not tested COORDINATION: Moderately impaired BLE MUSCLE LENGTH: Hamstrings: severely restricted B Thomas test: Severely restricted B. POSTURE: rounded shoulders, forward head, increased thoracic kyphosis, posterior pelvic tilt, and flexed trunk  LOWER EXTREMITY ROM:   BLE extremely stiff throughout, limited hip ROM in all planes, B knees limited in extension. LOWER EXTREMITY MMT:  3+/5 throughout. Unable to determine accuracy of MMT due to cognition. Functionally demosntrates poor coordinated activation and poor muscular endurance. BED MOBILITY: Min A for Supine<> Sit. Patient was using a ladder in his hospital bed and could move MI. TRANSFERS: Min A, mod VC, tends to lean back.  11/02/22 independent with use of hands on arm chair with a couple of tries CURB: Min A-Has one step out to his deck. STAIRS: N/A  GAIT: Gait pattern: step to pattern, decreased arm swing- Right, decreased arm swing- Left, decreased step length- Right, decreased step length- Left, shuffling, festinating, trunk flexed, narrow BOS, poor foot clearance- Right, and poor foot clearance- Left Distance walked: 9' Assistive device utilized: Environmental Consultant - 2 wheeled Level of assistance: Occasional min  A Comments: mod VC to stay close to walker, take longer steps. FUNCTIONAL TESTS:  5 times sit to stand: 38.23  09/29/22  could not performs 5XSTS, 11/02/22 = 47 seconds with cues to use arms, 07/21/23 really struggled today 55 seconds Timed up and go (TUG): 47.91 with RW, 07/21/22 TUG 29  seconds without device, 09/29/22 25 seconds, 11/02/22 TUG 20 seconds, 07/21/23 = 26 seconds no device SBA                                                                     PATIENT EDUCATION: Education details: Progress towards goals, POC Person educated: Patient and Spouse Education method: Explanation,  Demonstration, and Verbal cues  Education comprehension: verbalized understanding, returned demonstration, verbal cues required, and needs further education  HOME EXERCISE PROGRAM: PWR! moves  GOALS:  SHORT TERM GOALS: Target date: 01/25/24   Pt will perform HEP to maintain lower body strength, flexibility, posture with wife's assistance. Baseline:  Goal status: met 04/12/23  2. Pt will perform sit to stand, 8 of 10 reps with BUE support, with supervision, to maintain safety with transfers.  Baseline: min guard  Goal status: 02/07/23-CGA from elevated surface and max VC to lean forward, ongoing  continues to need a lot of cues for hand placement and to lean forward, tends to lean backward, needs cues and some assistance 02/28/24; 04/11/24 - requires CGA-MinA with Sit>Stand, can transfer with SBA and VC's 04/26/24  LONG TERM GOALS: Target date: 04/10/24   Patient will perform HEP progression to maintain lower body strength, flexibility, posture with wife's assistance. Baseline:  Goal status: has been able to walk with wife multiple times a day 03/27/24; pt states compliance to HEP and walking to and from the mailbox w wife every day 04/11/24  2.  Pt will ambulate 150-175 ft in 2 MWT with min guard assist, to preserve functional mobility within the home.  Baseline: Goal status:  met 08/25/23  3. Wife will  verbalize tips for fall prevention education to minimize fall frequency Baseline:  Goal status:progressing and doing well 4.  Patient will be able to toilet without freezing for over a 4 week period met 11/15/23  5.  Patient will be able to get out of bed from supine with wife assist minimal Assist,met 10/06/23   ASSESSMENT:  CLINICAL IMPRESSION: Spoke with Bob's wife, he is still able to walk to the mailbox, he is still able to step over the shower lip to get in the shower, he can sit in a dining room chair but usually sits in a w/c.  He is able to get out of bed with some help, still needs help to stand at times, he will forget to push up and needs cues.  Wife is pleased tha the has been able to maintain his function.   OBJECTIVE IMPAIRMENTS: Abnormal gait, decreased activity tolerance, decreased balance, decreased cognition, decreased coordination, decreased endurance, decreased mobility, difficulty walking, decreased ROM, decreased strength, decreased safety awareness, impaired flexibility, impaired UE functional use, improper body mechanics, and postural dysfunction.   PLAN:  PT FREQUENCY: 1x/week  PT DURATION: 12 weeks  PLANNED INTERVENTIONS: Therapeutic exercises, Therapeutic activity, Neuromuscular re-education, Balance training, Gait training, Patient/Family education, Self Care, Joint mobilization, Stair training, Moist heat, and Manual therapy  PLAN FOR NEXT SESSION:   Will continue to work with patient to help him and his wife stay living at home and with less difficulty   Patient Details  Name: VELMER WOELFEL MRN: [id number] Date of Birth: 07/30/1940 Referring Provider:  [name], DO  Ozell Mainland, PT 06/12/2024 3:36 PM   "

## 2024-06-13 ENCOUNTER — Ambulatory Visit: Admitting: Neurology

## 2024-06-13 ENCOUNTER — Other Ambulatory Visit (HOSPITAL_BASED_OUTPATIENT_CLINIC_OR_DEPARTMENT_OTHER): Payer: Self-pay

## 2024-06-13 ENCOUNTER — Encounter: Payer: Self-pay | Admitting: Neurology

## 2024-06-13 VITALS — BP 149/65 | HR 65 | Ht 71.0 in | Wt 144.4 lb

## 2024-06-13 DIAGNOSIS — G20A1 Parkinson's disease without dyskinesia, without mention of fluctuations: Secondary | ICD-10-CM

## 2024-06-13 MED ORDER — CARBIDOPA-LEVODOPA 25-100 MG PO TABS
2.0000 | ORAL_TABLET | Freq: Three times a day (TID) | ORAL | 5 refills | Status: AC
  Filled 2024-06-13 (×2): qty 180, 30d supply, fill #0

## 2024-06-13 NOTE — Patient Instructions (Signed)
 Increase carbidopa -levodopa  to 2 pills three times daily (7 AM, 12 PM, 5 PM).  If still wearing off after a month, contact me Stop amantadine  Refer to speech therapy for dysphagia Follow up on March 6 as scheduled.

## 2024-06-15 ENCOUNTER — Other Ambulatory Visit (HOSPITAL_BASED_OUTPATIENT_CLINIC_OR_DEPARTMENT_OTHER): Payer: Self-pay

## 2024-06-18 ENCOUNTER — Other Ambulatory Visit (HOSPITAL_BASED_OUTPATIENT_CLINIC_OR_DEPARTMENT_OTHER): Payer: Self-pay

## 2024-06-19 ENCOUNTER — Encounter: Payer: Self-pay | Admitting: Physical Therapy

## 2024-06-19 ENCOUNTER — Ambulatory Visit: Attending: Family Medicine | Admitting: Physical Therapy

## 2024-06-19 ENCOUNTER — Telehealth: Payer: Self-pay | Admitting: Neurology

## 2024-06-19 DIAGNOSIS — R278 Other lack of coordination: Secondary | ICD-10-CM | POA: Insufficient documentation

## 2024-06-19 DIAGNOSIS — R2689 Other abnormalities of gait and mobility: Secondary | ICD-10-CM | POA: Insufficient documentation

## 2024-06-19 DIAGNOSIS — I69318 Other symptoms and signs involving cognitive functions following cerebral infarction: Secondary | ICD-10-CM | POA: Diagnosis present

## 2024-06-19 DIAGNOSIS — M6281 Muscle weakness (generalized): Secondary | ICD-10-CM | POA: Insufficient documentation

## 2024-06-19 DIAGNOSIS — M25512 Pain in left shoulder: Secondary | ICD-10-CM | POA: Diagnosis present

## 2024-06-19 DIAGNOSIS — Z9181 History of falling: Secondary | ICD-10-CM | POA: Diagnosis present

## 2024-06-19 DIAGNOSIS — I6523 Occlusion and stenosis of bilateral carotid arteries: Secondary | ICD-10-CM | POA: Diagnosis present

## 2024-06-19 DIAGNOSIS — I69354 Hemiplegia and hemiparesis following cerebral infarction affecting left non-dominant side: Secondary | ICD-10-CM | POA: Insufficient documentation

## 2024-06-19 DIAGNOSIS — R296 Repeated falls: Secondary | ICD-10-CM | POA: Insufficient documentation

## 2024-06-19 DIAGNOSIS — R41840 Attention and concentration deficit: Secondary | ICD-10-CM | POA: Diagnosis present

## 2024-06-19 DIAGNOSIS — R41841 Cognitive communication deficit: Secondary | ICD-10-CM | POA: Diagnosis present

## 2024-06-19 DIAGNOSIS — R262 Difficulty in walking, not elsewhere classified: Secondary | ICD-10-CM | POA: Insufficient documentation

## 2024-06-19 NOTE — Telephone Encounter (Signed)
 William [name] is in office stating that she is needing a signature from Dr. Skeet for Medication Order.

## 2024-06-19 NOTE — Telephone Encounter (Signed)
 Signed and faxed to [phone] [facility]

## 2024-06-19 NOTE — Therapy (Signed)
 "  OUTPATIENT PHYSICAL THERAPY NEURO TREATMENT      Patient Name: William [name] MRN: [id number] DOB:[birth year], 84 y.o., male Today's Date: 06/19/2024  PCP: [name]  DO REFERRING PROVIDER: same   END OF SESSION:  PT End of Session - 06/19/24 1534     Visit Number [id number]    Date for Recertification  08/11/24    Authorization Type HTA    PT Start Time 1530    PT Stop Time 1615    PT Time Calculation (min) 45 min    Equipment Utilized During Treatment Gait belt    Activity Tolerance Patient tolerated treatment well    Behavior During Therapy WFL for tasks assessed/performed                    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   HTN (hypertension) 07/19/2015   Hyperlipidemia    Kidney disease, chronic, stage III (GFR 30-59 ml/min) (HCC)    Major neurocognitive disorder 01/09/2014   Mild, related to stroke history   Nocturia    Parkinson disease (HCC)    Renal insufficiency 06/25/2013   S/P carotid endarterectomy    BILATERAL ICA--  PATENT PER DUPLEX  05-19-2012   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/26/2017   Right calf   Urinary frequency    Vitamin D  deficiency    Past Surgical History:  Procedure Laterality Date   APPENDECTOMY  AS CHILD   CARDIOVASCULAR STRESS TEST  03-27-2012  DR CRENSHAW   LOW RISK LEXISCAN  STUDY-- PROBABLE NORMAL PERFUSION AND SOFT TISSUE ATTENUATION/  NO ISCHEMIA/ EF 51%   CAROTID ENDARTERECTOMY Bilateral LEFT  11-12-2008  DR GREG HAYES   RIGHT ICA  2006  ([facility])   CHOLECYSTECTOMY N/A 02/23/2022    Procedure: LAPAROSCOPIC CHOLECYSTECTOMY;  Surgeon: [name], MD;  Location: [facility];  Service: General;  Laterality: N/A;   CYSTOSCOPY W/ RETROGRADES Bilateral 06/22/2021   Procedure: CYSTOSCOPY WITH RETROGRADE PYELOGRAM;  Surgeon: [name], MD;  Location: [facility];  Service: Urology;  Laterality: Bilateral;   CYSTOSCOPY WITH LITHOLAPAXY N/A 02/26/2013   Procedure: CYSTOSCOPY WITH LITHOLAPAXY;  Surgeon: Senior Matilda, MD;  Location: [facility];  Service: Urology;  Laterality: N/A;   ENDOSCOPIC RETROGRADE CHOLANGIOPANCREATOGRAPHY (ERCP) WITH PROPOFOL  N/A 02/22/2022   Procedure: ENDOSCOPIC RETROGRADE CHOLANGIOPANCREATOGRAPHY (ERCP) WITH PROPOFOL ;  Surgeon: [name], MD;  Location: [facility];  Service: Gastroenterology;  Laterality: N/A;   EYE SURGERY  Jan. 2016   cataract surgery both eyes   INGUINAL HERNIA REPAIR Right 11-08-2006   IR KYPHO EA ADDL LEVEL THORACIC OR LUMBAR  02/12/2021   IR RADIOLOGIST EVAL & MGMT  02/18/2021   MASS EXCISION N/A 03/03/2016   Procedure: EXCISION OF BACK  MASS;  Surgeon: [name], MD;  Location: [facility];  Service: General;  Laterality: N/A;   MOHS SURGERY Left 1/ 2016   [name]-- Basal cell   PROSTATE SURGERY     REMOVAL OF  STONES  02/22/2022   Procedure: REMOVAL OF STONES;  Surgeon: [name], MD;  Location: [facility]COPY;  Service: Gastroenterology;;   [name]  02/22/2022   Procedure: [name];  Surgeon: [name], MD;  Location: [facility] END[facility];  Service: Gastroenterology;;   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 N/A 06/22/2021   Procedure: TRANSURETHRAL RESECTION OF BLADDER TUMOR;  Surgeon: [name], MD;  Location: [facility];  Service: Urology;  Laterality: N/A;   TRANSURETHRAL RESECTION OF PROSTATE N/A 02/26/2013   Procedure: TRANSURETHRAL RESECTION OF THE PROSTATE WITH GYRUS INSTRUMENTS;  Surgeon: Senior Matilda, MD;   Location: [facility];  Service: Urology;  Laterality: N/A;   TRANSURETHRAL RESECTION OF PROSTATE N/A 06/22/2021   Procedure: TRANSURETHRAL RESECTION OF THE PROSTATE (TURP);  Surgeon: [name], MD;  Location: [facility];  Service: Urology;  Laterality: N/A;   Patient Active Problem List   Diagnosis Date Noted   Hx of constipation 05/08/2024   Bronchitis 06/16/2023   Impacted cerumen of left ear 06/16/2023   Acute encephalopathy 04/18/2023   Parkinson's disease (HCC) 04/18/2023   History of CVA with residual deficit 04/18/2023   Chronic kidney disease, stage 3b (HCC) 04/18/2023   Dementia with behavioral disturbance (HCC) 04/18/2023   Anemia of chronic disease 04/18/2023   Laceration of right upper extremity 02/24/2023   Dermatitis 02/24/2023   Lung nodules 02/24/2023   Dysuria 08/03/2022   Nonintractable headache 07/01/2022   Bilateral impacted cerumen 06/24/2022   Rash 06/15/2022   Postoperative ileus (HCC) 02/27/2022   Ileus, postoperative (HCC) 02/26/2022   Choledocholithiasis 02/19/2022   DNR (do not resuscitate) 02/19/2022   Left elbow pain 01/26/2022   Mid back pain on left side 01/26/2022   Rib pain 01/26/2022   Acute pain of left shoulder 11/12/2021   Leukocytes in urine 11/12/2021   Urinary frequency 11/12/2021   Thrush 10/08/2021   Hemiplegia, 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   Insomnia    Prediabetes    Acute 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 (HCC)    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   Transaminitis 07/27/2021   UTI (urinary tract infection) 07/27/2021   CVA (cerebral vascular accident) (HCC) 07/27/2021   Fall 07/27/2021   Hyperglycemia 07/27/2021   Cholelithiasis 07/27/2021   Hypoxia 07/27/2021   Nausea and vomiting 07/27/2021   Acute metabolic encephalopathy 07/27/2021   Normocytic anemia 07/27/2021   Chronic back pain 07/27/2021   Malignant  neoplasm of overlapping sites of bladder (HCC) 06/22/2021   Closed fracture of first lumbar vertebra with routine healing 02/03/2021   Closed fracture of multiple ribs 11/18/2020   Anxiety 01/29/2020   Leg pain, bilateral 01/29/2020   Ingrown toenail 07/13/2019   Lumbar spondylosis 05/02/2018   Pain in left knee 03/09/2018   Osteoarthritis of left hip 01/16/2018   Trochanteric bursitis of left hip 01/16/2018   Preventative health care 09/26/2017   HTN (hypertension) 07/19/2015   Hyperlipidemia 07/19/2015   Great toe pain 02/11/2014   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   Obesity (BMI 30-39.9) 06/25/2013   Renal insufficiency 06/25/2013   Weakness of left arm 06/25/2013   Sebaceous cyst 03/03/2011   Sprain of lumbar region 07/31/2010   Rib pain, left 08/29/2009   Carotid artery stenosis, asymptomatic, bilateral 05/02/2009   Eczema, atopic 05/31/2008   Vitamin D  deficiency 03/01/2008   BPH (benign prostatic hyperplasia) 08/06/2007   Fasting hyperglycemia 12/21/2006   History of right MCA infarct 2006  ONSET DATE: 06/29/22 REFERRING DIAG:  Diagnosis  I63.9 (ICD-10-CM) - Cerebrovascular accident (CVA), unspecified mechanism (HCC)  W19.XXXD (ICD-10-CM) - Fall, subsequent encounter    THERAPY DIAG:  Muscle weakness (generalized)  Other abnormalities of gait and mobility  Hemiplegia and hemiparesis following cerebral infarction affecting left non-dominant side (HCC)  Acute pain of left shoulder  Repeated falls  Difficulty in walking, not elsewhere classified  Rationale for Evaluation and Treatment: Rehabilitation  SUBJECTIVE:                                                                                                                                                                                         SUBJECTIVE STATEMENT:  Pts' wife reports no new issues,no falls  PERTINENT HISTORY:  William [name] is a 84 year old man with dementia, CKD, HTN,  CAD, HLD and history of CVA  (2006, 2023), Parkinson's  PAIN:  Are you having pain? Faces/behavior scale- mild pain BLEs  and back   PRECAUTIONS: Fall  WEIGHT BEARING RESTRICTIONS: No  FALLS: Has patient fallen in last 6 months? No  LIVING ENVIRONMENT: Lives with: lives with their family and lives with their spouse Lives in: House/apartment Stairs: 1 brick high step Has following equipment at home: Environmental Consultant - 2 wheeled, Wheelchair (manual), Shower bench, bed side commode, Grab bars, and hospital bed, sliding pad for car  PLOF: Independent with basic ADLs and Independent with household mobility without device  PATIENT GOALS: Walk around the block with his wife. Be able to use the dining room chair rather than have to use the W/C. Increased I with bed mobility and simple tasks at home like make some coffee or brush his teeth, Step over tub to shower, has bench, but he can't really use it. Up and down the one step to get to back deck. 11/23/22: Patient's wife Devere updated his goals today: Walk around store (gets distracted), swing a golf club again (thinking of driving range), get up on his own, walk without leaning forward, more recognition of the left side (from CVA), not need gait belt, OT-- be able to get dressed more on his own, navigate the newspaper.  OBJECTIVE:   TREATMENT: 06/20/23 Nustep level 5 x 8 minutes Bike level 4 x 6 mintues UBE level 4 x 5 minutes Standing ball toss Ball bounce Ball kicks Gait outside HHA around 1/2 the parking [place] then up and wround to the front of the building Seated crunches  06/12/24 Nustep level 5 x 8 minutes TUG 16 seconds Bike level 4 x 5 minutes Ball toss  [employer] kicks Side stepping HHA Backward walk HHA HHA walking 400  feet HHA walking 400 feet, more shuffling and needed more assist  06/05/24 Nustep level 5 x 8 minutes Gait outside with HHA 1/2 parking island then rest and then around building to front LAQ 2.5# 2.5#  marches Standing ball toss, ball bounce and ball kicks LArge ball toss Red tband left hip abduction Elbow touches Standing with SPC, 6 toe touches Reaching for cone and then sitting up tall Gait out back door to the car  05/22/24 NuStep x6 mins level 5 UBE x5 mins  Seated physioball push with therapist with multi directional pressures Standing ball pass with BLE involvement and cues to inc use of LLE for returns  Knee flexion extension on physioball x30 Seated hip extension on small ball with UE on knee and axial pressure through LLE x10 reps, 5 sec hold   05/08/24 NuStep L5x47mins  Bike L3 x66mins  UBE L2 x4 mins  LAQ 2# 2x10 Marching 2#  Kicking cones  Standing bouncing ball   05/03/24 Nustep level 4 x 6 minutes Bike level 4 x 5 minutes Gait outside with HHA and gait belt 1/2 parking [place] and then rest, then walk in the grass around the side of the building to the front UBE level 3 x 4 minutes LAQ Marches Standing ball toss and bounce Gait with HHA x 200 feet and then out to the car  04/26/24 Tug 15 seconds Gait outside around 1/2 parking [place] and then through grass to the front door Partial sit ups Cone reaching with trunk rotation and elongation and to the floor, PT call out color and he would reach out and or kick Standing fall catch, bounce or kick fairly rapidly Bike 5 minutes  04/17/24 Nustep level 5 x 5 minutes Bike level 4 x 5 minutes Gait outside with HHA up and down curbs, slopes all the way around the back building a rest at a table in the back, up the hill a standing rest and then to the table int he island to rest again, really struggled coming up the hill. Gait out to the car  04/10/24 Nustep lvl 5 for 6 min LE SciFit lvl 4 for 5 min UBE Bike lvl 3 2 min fwd, 2 min bkwd Ambulating around the clinic to find cones for cardiovascular endurance, functional mobility, and visual scanning.  Car and toilet transfers  04/05/24 Nustep level 5 x 6  minutes STM to the left upper trap Bike level 4 x 6 minutes Standing reaching for numbers called out up to a sequence of 4 Standing ball bouncing Gait outside around parking [place], rest and then around to the front, then 200 feet inside and out to car  03/27/24 Nustep level 5 x 6 minutes Bike level 5 x 5 minutes STM with the tgun to the left upper trap area Passive ROM to the left shoulder all motions to his tolerance UBE level 4 x 4 minutes Gait outside with HHA 1/2 the parking [place] and then rest and then around to the front of the building on the smooth surface Seated left LAQ, marches Standing ball bouncing Standing HHA side stepping and backward walking  03/20/24 Nustep level 5 x 6 minutes Bike level 4 x 5 minutes UBE level 3 x 4 minutes Gait outside 1/2 around the parking island rest and then through grass back to the front of the building some cues for step length Seated partial sit ups, worked on some trunk mobility trying to get him to lean side to side.  [employer]  bouncing in sitting and in standing.  Side stepping, gait 2 laps with HHA PROM of the left shoulder  03/15/2024 Nustep level 5 x 5 minutes Bike level 4 x 5 minutes Dribbling ball Partial sit ups Ball kicks Walked out side with HHA 1/2 way around parking [place] and then rest and then from there to the front door through some deep grass, this was very difficult for him, needed a lot of cues to take bigger steps and needed more assist, once we did this he had a hard time getting his feet and steps back. Did 240 feet HHA walking in the clinic and then to the car after this to get the stepping pattern back  PATIENT EDUCATION: Education details: none new 02/16/23 Person educated: Patient and Spouse Education method: Explanation, Demonstration, Tactile cues, and Verbal cues Education comprehension: verbalized understanding, returned demonstration, verbal cues required, tactile cues required, and needs further education    ------------------------------------------------------------------------------------------------------- (Measures in this section from initial evaluation unless otherwise noted) DIAGNOSTIC FINDINGS:  MRI 2021 with degenerative changes in lumbar spine, L3-4 R subarticular stenosis COGNITION: Overall cognitive status: History of cognitive impairments - at baseline SENSATION: Not tested COORDINATION: Moderately impaired BLE MUSCLE LENGTH: Hamstrings: severely restricted B Thomas test: Severely restricted B. POSTURE: rounded shoulders, forward head, increased thoracic kyphosis, posterior pelvic tilt, and flexed trunk  LOWER EXTREMITY ROM:   BLE extremely stiff throughout, limited hip ROM in all planes, B knees limited in extension. LOWER EXTREMITY MMT:  3+/5 throughout. Unable to determine accuracy of MMT due to cognition. Functionally demosntrates poor coordinated activation and poor muscular endurance. BED MOBILITY: Min A for Supine<> Sit. Patient was using a ladder in his hospital bed and could move MI. TRANSFERS: Min A, mod VC, tends to lean back.  11/02/22 independent with use of hands on arm chair with a couple of tries CURB: Min A-Has one step out to his deck. STAIRS: N/A  GAIT: Gait pattern: step to pattern, decreased arm swing- Right, decreased arm swing- Left, decreased step length- Right, decreased step length- Left, shuffling, festinating, trunk flexed, narrow BOS, poor foot clearance- Right, and poor foot clearance- Left Distance walked: 26' Assistive device utilized: Environmental Consultant - 2 wheeled Level of assistance: Occasional min A Comments: mod VC to stay close to walker, take longer steps. FUNCTIONAL TESTS:  5 times sit to stand: 38.23  09/29/22  could not performs 5XSTS, 11/02/22 = 47 seconds with cues to use arms, 07/21/23 really struggled today 55 seconds Timed up and go (TUG): 47.91 with RW, 07/21/22 TUG 29  seconds without device, 09/29/22 25 seconds, 11/02/22 TUG 20 seconds, 07/21/23 = 26  seconds no device SBA                                                                     PATIENT EDUCATION: Education details: Progress towards goals, POC Person educated: Patient and Spouse Education method: Programmer, Multimedia, Facilities Manager, and Verbal cues  Education comprehension: verbalized understanding, returned demonstration, verbal cues required, and needs further education  HOME EXERCISE PROGRAM: PWR! moves  GOALS:  SHORT TERM GOALS: Target date: 01/25/24   Pt will perform HEP to maintain lower body strength, flexibility, posture with wife's assistance. Baseline:  Goal status: met 04/12/23  2. Pt will perform  sit to stand, 8 of 10 reps with BUE support, with supervision, to maintain safety with transfers.  Baseline: min guard  Goal status: 02/07/23-CGA from elevated surface and max VC to lean forward, ongoing  continues to need a lot of cues for hand placement and to lean forward, tends to lean backward, needs cues and some assistance 02/28/24; 04/11/24 - requires CGA-MinA with Sit>Stand, can transfer with SBA and VC's 04/26/24  LONG TERM GOALS: Target date: 04/10/24   Patient will perform HEP progression to maintain lower body strength, flexibility, posture with wife's assistance. Baseline:  Goal status: has been able to walk with wife multiple times a day 03/27/24; pt states compliance to HEP and walking to and from the mailbox w wife every day 04/11/24  2.  Pt will ambulate 150-175 ft in 2 MWT with min guard assist, to preserve functional mobility within the home.  Baseline: Goal status:  met 08/25/23  3. Wife will verbalize tips for fall prevention education to minimize fall frequency Baseline:  Goal status:progressing and doing well 4.  Patient will be able to toilet without freezing for over a 4 week period met 11/15/23  5.  Patient will be able to get out of bed from supine with wife assist minimal Assist,met 10/06/23   ASSESSMENT:  CLINICAL IMPRESSION:  Octaviano was a  little more soft spoken today, on the walk he did well for the first half and then once fatigued he really started leaning forward and shuffling more.  Spoke with Bob's wife, he is still able to walk to the mailbox, he is still able to step over the shower lip to get in the shower, he can sit in a dining room chair but usually sits in a w/c.  He is able to get out of bed with some help, still needs help to stand at times, he will forget to push up and needs cues.  Wife is pleased tha the has been able to maintain his function.   OBJECTIVE IMPAIRMENTS: Abnormal gait, decreased activity tolerance, decreased balance, decreased cognition, decreased coordination, decreased endurance, decreased mobility, difficulty walking, decreased ROM, decreased strength, decreased safety awareness, impaired flexibility, impaired UE functional use, improper body mechanics, and postural dysfunction.   PLAN:  PT FREQUENCY: 1x/week  PT DURATION: 12 weeks  PLANNED INTERVENTIONS: Therapeutic exercises, Therapeutic activity, Neuromuscular re-education, Balance training, Gait training, Patient/Family education, Self Care, Joint mobilization, Stair training, Moist heat, and Manual therapy  PLAN FOR NEXT SESSION:   Will continue to work with patient to help him and his wife stay living at home and with less difficulty   Patient Details  Name: William [name] MRN: [id number] Date of Birth: 1940/12/29 Referring Provider:  [name], DO  Ozell Mainland, PT 06/19/2024 3:35 PM   "

## 2024-06-20 ENCOUNTER — Emergency Department (HOSPITAL_COMMUNITY)

## 2024-06-20 ENCOUNTER — Emergency Department (HOSPITAL_COMMUNITY)
Admission: EM | Admit: 2024-06-20 | Discharge: 2024-06-20 | Disposition: A | Discharge status: Home / Self Care | Attending: Emergency Medicine | Admitting: Emergency Medicine

## 2024-06-20 ENCOUNTER — Encounter (HOSPITAL_COMMUNITY): Payer: Self-pay

## 2024-06-20 DIAGNOSIS — R531 Weakness: Secondary | ICD-10-CM | POA: Insufficient documentation

## 2024-06-20 DIAGNOSIS — F0390 Unspecified dementia without behavioral disturbance: Secondary | ICD-10-CM | POA: Diagnosis not present

## 2024-06-20 DIAGNOSIS — I959 Hypotension, unspecified: Secondary | ICD-10-CM | POA: Insufficient documentation

## 2024-06-20 DIAGNOSIS — I2510 Atherosclerotic heart disease of native coronary artery without angina pectoris: Secondary | ICD-10-CM | POA: Insufficient documentation

## 2024-06-20 DIAGNOSIS — E875 Hyperkalemia: Secondary | ICD-10-CM | POA: Insufficient documentation

## 2024-06-20 DIAGNOSIS — N189 Chronic kidney disease, unspecified: Secondary | ICD-10-CM | POA: Diagnosis not present

## 2024-06-20 DIAGNOSIS — G20A1 Parkinson's disease without dyskinesia, without mention of fluctuations: Secondary | ICD-10-CM | POA: Insufficient documentation

## 2024-06-20 DIAGNOSIS — R300 Dysuria: Secondary | ICD-10-CM | POA: Diagnosis not present

## 2024-06-20 DIAGNOSIS — Z7902 Long term (current) use of antithrombotics/antiplatelets: Secondary | ICD-10-CM | POA: Diagnosis not present

## 2024-06-20 DIAGNOSIS — I129 Hypertensive chronic kidney disease with stage 1 through stage 4 chronic kidney disease, or unspecified chronic kidney disease: Secondary | ICD-10-CM | POA: Insufficient documentation

## 2024-06-20 DIAGNOSIS — Z91040 Latex allergy status: Secondary | ICD-10-CM | POA: Insufficient documentation

## 2024-06-20 DIAGNOSIS — Z79899 Other long term (current) drug therapy: Secondary | ICD-10-CM | POA: Diagnosis not present

## 2024-06-20 LAB — I-STAT CHEM 8, ED
BUN: 30 mg/dL — ABNORMAL HIGH (ref 8–23)
Calcium, Ion: 1.16 mmol/L (ref 1.15–1.40)
Chloride: 105 mmol/L (ref 98–111)
Creatinine, Ser: 1.7 mg/dL — ABNORMAL HIGH (ref 0.61–1.24)
Glucose, Bld: 92 mg/dL (ref 70–99)
HCT: 27 % — ABNORMAL LOW (ref 39.0–52.0)
Hemoglobin: 9.2 g/dL — ABNORMAL LOW (ref 13.0–17.0)
Potassium: 4.3 mmol/L (ref 3.5–5.1)
Sodium: 141 mmol/L (ref 135–145)
TCO2: 25 mmol/L (ref 22–32)

## 2024-06-20 LAB — URINALYSIS, ROUTINE W REFLEX MICROSCOPIC
Bacteria, UA: NONE SEEN
Bilirubin Urine: NEGATIVE
Glucose, UA: NEGATIVE mg/dL
Ketones, ur: NEGATIVE mg/dL
Nitrite: NEGATIVE
Protein, ur: 100 mg/dL — AB
RBC / HPF: >50 RBC/hpf (ref 0–5)
Specific Gravity, Urine: 1.01 (ref 1.005–1.030)
pH: 7 (ref 5.0–8.0)

## 2024-06-20 LAB — CBC WITH DIFFERENTIAL/PLATELET
Abs Immature Granulocytes: 0.03 K/uL (ref 0.00–0.07)
Basophils Absolute: 0.1 K/uL (ref 0.0–0.1)
Basophils Relative: 1 %
Eosinophils Absolute: 0.1 K/uL (ref 0.0–0.5)
Eosinophils Relative: 1 %
HCT: 32.3 % — ABNORMAL LOW (ref 39.0–52.0)
Hemoglobin: 10.6 g/dL — ABNORMAL LOW (ref 13.0–17.0)
Immature Granulocytes: 0 %
Lymphocytes Relative: 9 %
Lymphs Abs: 0.8 K/uL (ref 0.7–4.0)
MCH: 32.6 pg (ref 26.0–34.0)
MCHC: 32.8 g/dL (ref 30.0–36.0)
MCV: 99.4 fL (ref 80.0–100.0)
Monocytes Absolute: 0.5 K/uL (ref 0.1–1.0)
Monocytes Relative: 5 %
Neutro Abs: 7.7 K/uL (ref 1.7–7.7)
Neutrophils Relative %: 84 %
Platelets: 196 K/uL (ref 150–400)
RBC: 3.25 MIL/uL — ABNORMAL LOW (ref 4.22–5.81)
RDW: 13.5 % (ref 11.5–15.5)
WBC: 9.2 K/uL (ref 4.0–10.5)
nRBC: 0 % (ref 0.0–0.2)

## 2024-06-20 LAB — BASIC METABOLIC PANEL WITH GFR
Anion gap: 12 (ref 5–15)
BUN: 30 mg/dL — ABNORMAL HIGH (ref 8–23)
CO2: 23 mmol/L (ref 22–32)
Calcium: 8.7 mg/dL — ABNORMAL LOW (ref 8.9–10.3)
Chloride: 103 mmol/L (ref 98–111)
Creatinine, Ser: 1.63 mg/dL — ABNORMAL HIGH (ref 0.61–1.24)
GFR, Estimated: 42 mL/min — ABNORMAL LOW
Glucose, Bld: 100 mg/dL — ABNORMAL HIGH (ref 70–99)
Potassium: 5.2 mmol/L — ABNORMAL HIGH (ref 3.5–5.1)
Sodium: 138 mmol/L (ref 135–145)

## 2024-06-20 LAB — TROPONIN T, HIGH SENSITIVITY
Troponin T High Sensitivity: 45 ng/L — ABNORMAL HIGH (ref 0–19)
Troponin T High Sensitivity: 42 ng/L — ABNORMAL HIGH (ref 0–19)

## 2024-06-20 MED ORDER — CEPHALEXIN 250 MG PO CAPS
250.0000 mg | ORAL_CAPSULE | Freq: Once | ORAL | Status: AC
  Administered 2024-06-20: 250 mg via ORAL
  Filled 2024-06-20: qty 1

## 2024-06-20 MED ORDER — CEPHALEXIN 500 MG PO CAPS
500.0000 mg | ORAL_CAPSULE | Freq: Two times a day (BID) | ORAL | 0 refills | Status: DC
  Filled 2024-06-20 – 2024-06-21 (×2): qty 14, 7d supply, fill #0

## 2024-06-20 MED ORDER — ACETAMINOPHEN 500 MG PO TABS: 1000.0000 mg | ORAL_TABLET | Freq: Once | ORAL | Status: DC

## 2024-06-20 MED ORDER — CEPHALEXIN 250 MG PO CAPS: 500.0000 mg | ORAL_CAPSULE | Freq: Once | ORAL | Status: DC

## 2024-06-20 NOTE — ED Provider Notes (Incomplete)
 " Physical Exam  BP (!) 150/60   Pulse 65   Temp 97.6 F (36.4 C) (Oral)   Resp 15   SpO2 100%   Physical Exam Vitals and nursing note reviewed.  Constitutional:      General: He is awake. He is not in acute distress.    Appearance: He is normal weight. He is not toxic-appearing or diaphoretic.     Comments: Chronically ill-appearing with left sided deficit  HENT:     Head: Normocephalic and atraumatic.  Eyes:     General: No scleral icterus.    Extraocular Movements: Extraocular movements intact.     Pupils: Pupils are equal, round, and reactive to light.  Cardiovascular:     Rate and Rhythm: Normal rate and regular rhythm.  Pulmonary:     Effort: Pulmonary effort is normal. No respiratory distress.     Breath sounds: No stridor. No wheezing, rhonchi or rales.     Comments: Patient did display a wet sounding cough while I was in the room Abdominal:     General: Abdomen is flat. There is no distension.     Palpations: Abdomen is soft.     Tenderness: There is no abdominal tenderness. There is no right CVA tenderness, left CVA tenderness or guarding.  Musculoskeletal:     Right lower leg: No edema.     Left lower leg: No edema.     Comments: Reduced strength and movement of LUE which is chronic due to previous stroke  Skin:    General: Skin is warm.     Capillary Refill: Capillary refill takes less than 2 seconds.  Neurological:     Mental Status: He is alert. Mental status is at baseline.  Psychiatric:        Mood and Affect: Mood normal.        Behavior: Behavior normal. Behavior is cooperative.     Procedures  Procedures  ED Course / MDM   Clinical Course as of 06/20/24 1701  Wed Jun 20, 2024  1524 Waiting for repeat potassium, UA, and reasessment, wife says that this morning patient is a little out of it, lightheaded. Upon EMS arrival 80 systolic but then went back to normal. Some urinary discomfort, otherwise, no complaint. No reproducible abdominal pain.  [CH]  1526 No CP but elevated troponin [CH]    Clinical Course User Index [CH] Rein Popov, Terrall FALCON, PA-C   Medical Decision Making Amount and/or Complexity of Data Reviewed Labs: ordered. ECG/medicine tests: ordered.  Risk OTC drugs. Prescription drug management.   Patient signed out to myself by previous EDP Lonni Camp PA-C, please see his note for further detail.  Briefly patient is an 84 year old male with a significant past medical history of Parkinson's, previous stroke with left-sided deficit, acute metabolic encephalopathy, dementia with behavioral disturbances, as well as other chronic conditions.  Patient lives at home with wife and the wife states that she gave the patient his Sinemet  this morning and approximately 30 minutes later got him out of bed.  States that the patient was weak and then had an episode where his head drooped forward and he would not respond to her.  She states that the patient's eyes were open, denies new facial droop or known unilateral weakness.  States that she went and got the blood pressure cuff from another room and that the patient's blood pressure was 62/47 and that this was confirmed with a recheck.  She states that she called EMS who on arrival  reported a blood pressure of 82/60.  By the time that the patient got into the ambulance blood pressure improved to 150/70 with no interventions and patient was reportedly back to baseline.  Wife states that since arriving to the hospital patient has been back at baseline. Wife does state that the patient has had some intermittent suprapubic abdominal pain for the last 2-3 days however the patient denies this currently.   Denies acute infectious symptoms including fever, chills.  Denies current abdominal pain, chest pain, or shortness of breath. Initial lab work was noted of potassium of 5.2, this on recheck is down to 4.3.  Patient's hemoglobin was 10.6 however this appears chronic compared to previous. Denies gross  hematuria or hematochezia.  Troponin elevated at 45 and repeat 42.  At this time patient is pending urinalysis.  CT imaging of head shows only chronic changes, chest x-ray unremarkable. EKG shows sinus rhythm.   Patient ambulated well, repeat potassium is not elevated, patient states that he is asymptomatic at this time, urinalysis could possibly be consistent with infection, there is moderate amount of blood, patient is currently not experiencing abdominal pain or flank pain, so imaging of abdomen was not completed. Will cover with keflex  at this time given patient age and co-morbidities. Patient did have imaging completed approximately 2 months ago which did not show evidence of a kidney stone. I did educate that the patient should follow-up with his primary care provider for a recheck of his urine to make sure that blood resolves.  Will culture urine in case antibiotic needs to be changed.  Shared decision making discussion completed with wife who is at bedside and is patient's caregiver, wife in agreement that since patient is back at baseline she would prefer for him to be discharged as opposed to admitted to the hospital for further evaluation.  Patient in agreement with this as well.  Since patient ambulated well and workup has been reassuring patient discharged.  Return precautions given.  Unknown cause of weakness versus unresponsive episode earlier today, other than possible UTI. Patient has a complex history and this could be related to medications as well. Instructed wife and patient to monitor BP and check before taking home doses of BP meds. Patient and wife aware to follow-up with PCP. Vitals stable throughout ED course and patient back at baseline per wife.          "

## 2024-06-20 NOTE — ED Notes (Addendum)
Pt denies any pain or discomfort at this time.

## 2024-06-20 NOTE — ED Notes (Signed)
 Wife is at bedside, she states the pt has not had any of his regular home medications. She is asking if it is okay for him to take his home medications? Provider notified to advise.

## 2024-06-20 NOTE — ED Provider Notes (Signed)
 " Physical Exam  BP (!) 150/60   Pulse 65   Temp 97.6 F (36.4 C) (Oral)   Resp 15   SpO2 100%   Physical Exam Vitals and nursing note reviewed.  Constitutional:      General: He is awake. He is not in acute distress.    Appearance: He is normal weight. He is not toxic-appearing or diaphoretic.     Comments: Chronically ill-appearing with left sided deficit  HENT:     Head: Normocephalic and atraumatic.  Eyes:     General: No scleral icterus.    Extraocular Movements: Extraocular movements intact.     Pupils: Pupils are equal, round, and reactive to light.  Cardiovascular:     Rate and Rhythm: Normal rate and regular rhythm.  Pulmonary:     Effort: Pulmonary effort is normal. No respiratory distress.     Breath sounds: No stridor. No wheezing, rhonchi or rales.     Comments: Patient did display a wet sounding cough while I was in the room Abdominal:     General: Abdomen is flat. There is no distension.     Palpations: Abdomen is soft.     Tenderness: There is no abdominal tenderness. There is no right CVA tenderness, left CVA tenderness or guarding.  Musculoskeletal:     Right lower leg: No edema.     Left lower leg: No edema.     Comments: Reduced strength and movement of LUE which is chronic due to previous stroke  Skin:    General: Skin is warm.     Capillary Refill: Capillary refill takes less than 2 seconds.  Neurological:     Mental Status: He is alert. Mental status is at baseline.  Psychiatric:        Mood and Affect: Mood normal.        Behavior: Behavior normal. Behavior is cooperative.     Procedures  Procedures  ED Course / MDM   Clinical Course as of 06/20/24 1701  Wed Jun 20, 2024  1524 Waiting for repeat potassium, UA, and reasessment, wife says that this morning patient is a little out of it, lightheaded. Upon EMS arrival 80 systolic but then went back to normal. Some urinary discomfort, otherwise, no complaint. No reproducible abdominal pain.  [CH]  1526 No CP but elevated troponin [CH]    Clinical Course User Index [CH] Carlyon Nolasco, Terrall FALCON, PA-C   Medical Decision Making Amount and/or Complexity of Data Reviewed Labs: ordered. ECG/medicine tests: ordered.  Risk OTC drugs. Prescription drug management.   Patient signed out to myself by previous EDP Lonni Camp PA-C, please see his note for further detail.  Briefly patient is an 84 year old male with a significant past medical history of Parkinson's, previous stroke with left-sided deficit, acute metabolic encephalopathy, dementia with behavioral disturbances, as well as other chronic conditions.  Patient lives at home with wife and the wife states that she gave the patient his Sinemet  this morning and approximately 30 minutes later got him out of bed.  States that the patient was weak and then had an episode where his head drooped forward and he would not respond to her.  She states that the patient's eyes were open, denies new facial droop or known unilateral weakness.  States that she went and got the blood pressure cuff from another room and that the patient's blood pressure was 62/47 and that this was confirmed with a recheck.  She states that she called EMS who on arrival  reported a blood pressure of 82/60.  By the time that the patient got into the ambulance blood pressure improved to 150/70 with no interventions and patient was reportedly back to baseline.  Wife states that since arriving to the hospital patient has been back at baseline. Wife does state that the patient has had some intermittent suprapubic abdominal pain for the last 2-3 days however the patient denies this currently.   Denies acute infectious symptoms including fever, chills.  Denies current abdominal pain, chest pain, or shortness of breath. Initial lab work was noted of potassium of 5.2, this on recheck is down to 4.3.  Patient's hemoglobin was 10.6 however this appears chronic compared to previous. Denies gross  hematuria or hematochezia.  Troponin elevated at 45 and repeat 42.  At this time patient is pending urinalysis.  CT imaging of head shows only chronic changes, chest x-ray unremarkable. EKG shows sinus rhythm.   Patient ambulated well, repeat potassium is not elevated, patient states that he is asymptomatic at this time, urinalysis could possibly be consistent with infection, there is moderate amount of blood, patient is currently not experiencing abdominal pain or flank pain, so imaging of abdomen was not completed. Will cover with keflex  at this time given patient age and co-morbidities. Patient did have imaging completed approximately 2 months ago which did not show evidence of a kidney stone. I did educate that the patient should follow-up with his primary care provider for a recheck of his urine to make sure that blood resolves.  Will culture urine in case antibiotic needs to be changed.  Shared decision making discussion completed with wife who is at bedside and is patient's caregiver, wife in agreement that since patient is back at baseline she would prefer for him to be discharged as opposed to admitted to the hospital for further evaluation.  Patient in agreement with this as well.  Since patient ambulated well and workup has been reassuring patient discharged.  Return precautions given.  Unknown cause of weakness versus unresponsive episode earlier today, other than possible UTI. Patient has a complex history and this could be related to medications as well. Instructed wife and patient to monitor BP and check before taking home doses of BP meds. Patient and wife aware to follow-up with PCP. Vitals stable throughout ED course and patient back at baseline per wife.            Janetta Terrall FALCON, PA-C 06/21/24 0030    Lenor Hollering, MD 06/21/24 1500  "

## 2024-06-20 NOTE — ED Provider Notes (Signed)
 " [place] [facility] DE[facility]MENT AT [facility] Provider Note   CSN: [id number] Arrival date & time: 06/20/24  9071     Patient presents with: Hypotension   William [name] is a 84 y.o. male.   The history is provided by the patient, the spouse, the EMS personnel and medical records. No language interpreter was used.     84 year old male with extensive past medical history which include prior stroke, Parkinson disease, hypertension, chronic kidney disease, CAD status post carotid endarterectomy, hyperlipidemia, dementia brought here via EMS from home for for evaluation of confusion.  Per wife this morning patient seems to be drowsy and not responding to her.  She felt that he may have had a syncopal episode.  When EMS arrived, he had a documented blood pressure of 82/60 but by the time they brought him to the truck, his blood pressure increased without any intervention.  At this time patient is without any complaint.  He did mention having some urinary discomfort for the past few days and wife has noticed that his urine color is a little bit more concentrated.  Otherwise no complaint of headache, neck pain, chest pain, trouble breathing, abdominal pain, back pain, new numbness or new weakness.  Does have history of prior stroke causing left-sided weakness.  Patient previously was constipated but did had a normal bowel movement yesterday.  Prior to Admission medications  Medication Sig Start Date End Date Taking? Authorizing Provider  acetaminophen  (TYLENOL ) 325 MG tablet Take 2 tablets (650 mg total) by mouth every 6 (six) hours as needed for mild pain. 02/24/22   Rosalba Glendale DEL, PA-C  amLODipine  (NORVASC ) 10 MG tablet Take 1 tablet (10 mg total) by mouth every morning. 12/29/23   Lowne Chase, Yvonne R, DO  bacitracin  ointment Apply 1 Application topically 2 (two) times daily. 04/24/23   Christopher Savannah, PA-C  carBAMazepine  (TEGRETOL ) 100 MG/5ML suspension Take 5 mLs (100 mg total) by  mouth in the morning AND 5 mLs (100 mg total) daily at 2 PM AND 10 mLs (200 mg total) at bedtime. 04/10/24   Plovsky, Elna, MD  carbidopa -levodopa  (SINEMET  IR) 25-100 MG tablet Take 2 tablets by mouth 3 (three) times daily. 06/13/24   Skeet Juliene SAUNDERS, DO  Cholecalciferol  (VITAMIN D3) 50 MCG (2000 UT) CHEW Chew 1 tablet by mouth daily. Patient not taking: Reported on 06/13/2024    [provider]  clobetasol  cream (TEMOVATE ) 0.05 % Apply 1 Application to affected areas topically up to 2 (two) times daily as needed. Do not apply to face, groin, or under arms 11/02/22   [name] Rush, MD  clopidogrel  (PLAVIX ) 75 MG tablet Take 1 tablet (75 mg total) by mouth daily. 03/02/24   [name] Jamee SAUNDERS, DO  cyanocobalamin  (VITAMIN B12) 1000 MCG tablet Take 1 tablet (1,000 mcg total) by mouth daily. 03/02/24   [name] Jamee SAUNDERS, DO  EPINEPHrine  (EPIPEN  2-PAK) 0.3 mg/0.3 mL IJ SOAJ injection Inject 0.3 mg into the muscle as needed for anaphylaxis. 12/20/23   [name] Jamee SAUNDERS, DO  ferrous sulfate  325 (65 FE) MG EC tablet Take 325 mg by mouth 2 (two) times daily. *Crushed*    [provider]  fluticasone  (CUTIVATE ) 0.05 % cream 1 application  at bedtime as needed (psoriasis). 12/16/16   [provider]  glycerin  adult 2 g suppository Place 1 suppository rectally as needed for constipation. 03/10/24   Christopher Savannah, PA-C  hydrALAZINE  (APRESOLINE ) 25 MG tablet Take 1 tablet (25  mg total) by mouth 3 (three) times daily. 02/20/24   [name], DO  LORazepam  (ATIVAN ) 1 MG tablet Take 1/4 tablet by mouth in the morning, 1/2 tablet by mouth at lunch and 1 tablet by mouth at bedtime 04/04/23   Plovsky, Elna, MD  LORazepam  (ATIVAN ) 1 MG tablet Take 1/4 tablet (0.25 mg total) by mouth in the morning AND 1/2 tablet (0.5 mg total) daily in the afternoon AND 1 tablet (1 mg total) at bedtime. Patient taking differently: Take 1/4 tablet (0.25 mg total) by mouth in the morning AND 1 tab at 4 pm  daily in the afternoon AND 1 tablet (1 mg total) at bedtime. 11/08/23   Plovsky, Elna, MD  LORazepam  (ATIVAN ) 1 MG tablet Take 1 tablet (1 mg total) by mouth 3 (three) times daily. Take in the morning, at 3pm, and at bedtime 05/29/24   Plovsky, Elna, MD  Multiple Vitamins-Minerals (ADULT ONE DAILY GUMMIES) CHEW Chew 1 tablet by mouth every morning.    [provider]  NONFORMULARY OR COMPOUNDED ITEM Transport chair  dx parkinsons, hx falls 04/21/23   [name], Jamee R, DO  pantoprazole  (PROTONIX ) 40 MG tablet Take 1 tablet (40 mg total) by mouth daily. 05/21/24   [name], DO  polyethylene glycol (MIRALAX  / GLYCOLAX ) 17 g packet Take 17 g by mouth daily. 10/02/21   Freddi Hamilton, MD  tamsulosin  (FLOMAX ) 0.4 MG CAPS capsule Take 1 capsule (0.4 mg total) by mouth daily after supper. Patient taking differently: Take 0.4 mg by mouth at bedtime. 04/11/23   [name], DO  tamsulosin  (FLOMAX ) 0.4 MG CAPS capsule Take 1 capsule (0.4 mg total) by mouth at bedtime. 06/02/23     traZODone  (DESYREL ) 150 MG tablet Take 0.5 tablets (75 mg total) by mouth at bedtime. *Crushed* 05/29/24   Plovsky, Elna, MD  triamcinolone  cream (KENALOG ) 0.1 % Apply 1 application topically 2 (two) times daily. 11/15/22   Lowne Chase, Yvonne R, DO    Allergies: Bee venom, Strawberry extract, Latex, Zetia  [ezetimibe ], Adhesive [tape], and Statins    Review of Systems  All other systems reviewed and are negative.   Updated Vital Signs BP 128/63 (BP Location: Left Arm)   Pulse 64   Temp 97.8 F (36.6 C) (Oral)   Resp 18   SpO2 100%   Physical Exam Constitutional:      General: He is not in acute distress.    Appearance: He is well-developed.  HENT:     Head: Atraumatic.  Eyes:     Conjunctiva/sclera: Conjunctivae normal.  Cardiovascular:     Rate and Rhythm: Normal rate and regular rhythm.     Pulses: Normal pulses.     Heart sounds: Normal heart sounds.  Pulmonary:      Effort: Pulmonary effort is normal.     Breath sounds: Normal breath sounds.  Abdominal:     General: There is no distension.     Palpations: Abdomen is soft.     Tenderness: There is no abdominal tenderness.  Musculoskeletal:     Cervical back: Normal range of motion and neck supple.     Comments: Left arm weakness, chronic from prior stroke.  Able to move other extremities equally.  Skin:    Findings: No rash.  Neurological:     Mental Status: He is alert and oriented to person, place, and time.     (all labs ordered are listed, but only abnormal results are displayed) Labs  Reviewed  CBC WITH DIFFERENTIAL/PLATELET - Abnormal; Notable for the following components:      Result Value   RBC 3.25 (*)    Hemoglobin 10.6 (*)    HCT 32.3 (*)    All other components within normal limits  BASIC METABOLIC PANEL WITH GFR - Abnormal; Notable for the following components:   Potassium 5.2 (*)    Glucose, Bld 100 (*)    BUN 30 (*)    Creatinine, Ser 1.63 (*)    Calcium  8.7 (*)    GFR, Estimated 42 (*)    All other components within normal limits  TROPONIN T, HIGH SENSITIVITY - Abnormal; Notable for the following components:   Troponin T High Sensitivity 45 (*)    All other components within normal limits  URINALYSIS, ROUTINE W REFLEX MICROSCOPIC  TROPONIN T, HIGH SENSITIVITY    EKG: EKG Interpretation Date/Time:  Wednesday June 20 2024 11:18:24 EST Ventricular Rate:  64 PR Interval:  178 QRS Duration:  134 QT Interval:  442 QTC Calculation: 455 R Axis:   -79  Text Interpretation: Normal sinus rhythm Right bundle branch block Left anterior fascicular block Bifascicular block Minimal voltage criteria for LVH, may be normal variant ( R in aVL ) Septal infarct , age undetermined Abnormal ECG When compared with ECG of 10-Sep-2023 16:16, PREVIOUS ECG IS PRESENT Confirmed by Pamella Sharper [phone]) on 06/20/2024 1:55:25 PM ED ECG REPORT   Date: 06/20/2024  Rate: 64  Rhythm: normal  sinus rhythm  QRS Axis: left  Intervals: normal  ST/T Wave abnormalities: nonspecific ST changes  Conduction Disutrbances:right bundle branch block and left anterior fascicular block  Narrative Interpretation:   Old EKG Reviewed: unchanged  I have personally reviewed the EKG tracing and agree with the computerized printout as noted.   Radiology: CT Head Wo Contrast Result Date: 06/20/2024 EXAM: CT HEAD WITHOUT CONTRAST 06/20/2024 10:49:36 AM TECHNIQUE: CT of the head was performed without the administration of intravenous contrast. Automated exposure control, iterative reconstruction, and/or weight based adjustment of the mA/kV was utilized to reduce the radiation dose to as low as reasonably achievable. COMPARISON: Brain MRI 04/19/2023, Head CT 04/30/2024. CLINICAL HISTORY: 84 year old male with mental status change of unknown cause and hypotensive on presentation. FINDINGS: BRAIN AND VENTRICLES: No acute hemorrhage. No evidence of acute infarct. No hydrocephalus. No extra-axial collection. No mass effect or midline shift. Calcified atherosclerosis at the skull base. Extensive chronic encephalomalacia in the right hemisphere. Superimposed chronic left basal ganglia infarct with encephalomalacia. Subsequent ex vacuo ventricular enlargement is stable along with brain volume since last year. Patchy and confluent additional bilateral cerebral white matter hypodensity. Stable gray white differentiation. No suspicious intracranial vascular hyperdensity. ORBITS: No gaze deviation. SINUSES: Paranasal sinuses tympanic cavities and mastoids are clear. SOFT TISSUES AND SKULL: No acute soft tissue abnormality. No skull fracture. IMPRESSION: 1. No acute intracranial abnormality. 2. 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 Chronic white matter disease. Electronically signed by: Helayne Hurst MD 06/20/2024 10:57 AM EST RP Workstation: [id number]   DG Chest 1 View Result Date: 06/20/2024 CLINICAL DATA:  Altered  mental status EXAM: CHEST  1 VIEW COMPARISON:  April 30, 2024 FINDINGS: The heart size and mediastinal contours are within normal limits. Both lungs are clear. The visualized skeletal structures are unremarkable. IMPRESSION: No active disease. Electronically Signed   By: Lynwood Landy Raddle M.D.   On: 06/20/2024 10:50     Procedures   Medications Ordered in the ED  acetaminophen  (TYLENOL ) tablet 1,000 mg (  has no administration in time range)    Clinical Course as of 06/20/24 1547  Wed Jun 20, 2024  1524 Waiting for repeat potassium, UA, and reasessment, wife says that this morning patient is a little out of it, lightheaded. Upon EMS arrival 80 systolic but then went back to normal. Some urinary discomfort, otherwise, no complaint. No reproducible abdominal pain. [CH]  1526 No CP but elevated troponin [CH]    Clinical Course User Index [CH] Hinnant, Terrall FALCON, PA-C                                 Medical Decision Making Amount and/or Complexity of Data Reviewed Labs: ordered. ECG/medicine tests: ordered.  Risk OTC drugs. Prescription drug management.   BP 128/63 (BP Location: Left Arm)   Pulse 64   Temp 97.8 F (36.6 C) (Oral)   Resp 18   SpO2 100%   25:77 PM 84 year old male with extensive past medical history which include prior stroke, Parkinson disease, hypertension, chronic kidney disease, CAD status post carotid endarterectomy, hyperlipidemia, dementia brought here via EMS from home for for evaluation of confusion.  Per wife this morning patient seems to be drowsy and not responding to her.  She felt that he may have had a syncopal episode.  When EMS arrived, he had a documented blood pressure of 82/60 but by the time they brought him to the truck, his blood pressure increased without any intervention.  At this time patient is without any complaint.  He did mention having some urinary discomfort for the past few days and wife has noticed that his urine color is a little bit  more concentrated.  Otherwise no complaint of headache, neck pain, chest pain, trouble breathing, abdominal pain, back pain, new numbness or new weakness.  Does have history of prior stroke causing left-sided weakness.  Patient previously was constipated but did had a normal bowel movement yesterday.  Exam overall reassuring patient is alert and oriented x 4 and he is in no acute discomfort.  His vital signs are overall reassuring.  -Labs ordered, independently viewed and interpreted by me.  Labs remarkable for potassium elevated at 5.2, will recheck before treatment.  Troponin is elevated at 45 -The patient was maintained on a cardiac monitor.  I personally viewed and interpreted the cardiac monitored which showed an underlying rhythm of: SR -Imaging independently viewed and interpreted by me and I agree with radiologist's interpretation.  Result remarkable for head CT unremarkable, CXR reassuring -This patient presents to the ED for concern of low blood pressure, this involves an extensive number of treatment options, and is a complaint that carries with it a high risk of complications and morbidity.  The differential diagnosis includes infection, errant BP reading, anemia, hypovolemia -Co morbidities that complicate the patient evaluation includes stroke, parkinson, HTN, CKD -Treatment includes tylenol  -Reevaluation of the patient after these medicines showed that the patient improved -PCP office notes or outside notes reviewed -Discussion with oncoming provider who will f/u on repeat labs, UA and determine disposition -Escalation to admission/observation considered: dispo pending.      Final diagnoses:  Dysuria    ED Discharge Orders     None          Nivia Colon, PA-C 06/21/24 1507  "

## 2024-06-20 NOTE — Discharge Instructions (Addendum)
 It was a pleasure taking care of you today.  Based on your history, physical exam, labs, as well as imaging I feel you are safe for discharge.  Today in your urine and there was blood.  This could be coming from a urinary tract infection or a kidney stone.  If you develop flank pain or abdominal pain, you may need to return to the emergency department.  I do recommend that you have your urine rechecked to make sure that the blood goes away.  I have sent in an antibiotic to the pharmacy called Keflex  for your urinary tract infection.  Please pick it up from the pharmacy and take as prescribed.  If the antibiotic needs to be changed you will be notified.  Please continue to monitor for further episodes similar to today.  Please also continue to monitor the patient's blood pressure and be sure to take the blood pressure prior to giving blood pressure medications.  Please make your primary care provider aware of your workup and all findings here today.  If you experience any more episodes like today, low blood pressure, fever, severe abdominal pain, flank pain, urinary symptoms, or other concerning symptom please return to the emergency department or seek further medical care.  If symptoms worsen recommend follow-up in 48 hours.  Recommend follow-up with your primary care provider within the next week.

## 2024-06-20 NOTE — ED Triage Notes (Signed)
 Pt BIB [facility] from home, pt wife said he wasn't acting like himself not responding to her. Bp upon Ems arrival was 82/60, by the time pt got into the truck pt bp increased to 150/70 with no interventions, pt back to baseline.

## 2024-06-20 NOTE — ED Provider Triage Note (Signed)
 Emergency Medicine Provider Triage Evaluation Note  William [name] , a 84 y.o. male  was evaluated in triage.  Pt complains of altered mental status.  Wife at bedside provided history.  Wife notes that patient woke up not acting his normal self.  She notes he did not want to wake up which is atypical for patient.  She then noticed that patient was shuffling while walking which is atypical.  While patient was sitting down his head fell forward and mouth opened and patient was not able to respond (eyes were still opened).  Wife notes episode lasted for 10 to 15 minutes.  Upon EMS arrival patient found to be hypotensive at 82/60. No convulsions. Patient was in his normal state of health yesterday.  No history of seizures.  Previous CVA with residual left-sided weakness. No recent illness. Patient denies chest pain, SOB, and abdominal pain  Review of Systems  Positive: weakness Negative: fever  Physical Exam  BP 128/63 (BP Location: Left Arm)   Pulse 64   Temp 97.8 F (36.6 C) (Oral)   Resp 18   SpO2 100%  Gen:   Awake, no distress   Resp:  Normal effort  MSK:   Moves extremities without difficulty  Other:  Answering questions appropriately. Able to answer full name, location, and year.   Medical Decision Making  Medically screening exam initiated at 10:26 AM.  Appropriate orders placed.  William [name] was informed that the remainder of the evaluation will be completed by another provider, this initial triage assessment does not replace that evaluation, and the importance of remaining in the ED until their evaluation is complete.  AMS- CT head, labs, CXR. Has some left sided weakness which wife notes is baseline with no worsening.    Lorelle Aleck BROCKS, [place] 06/20/24 1030

## 2024-06-21 ENCOUNTER — Encounter: Payer: Self-pay | Admitting: Family Medicine

## 2024-06-21 ENCOUNTER — Other Ambulatory Visit (HOSPITAL_BASED_OUTPATIENT_CLINIC_OR_DEPARTMENT_OTHER): Payer: Self-pay

## 2024-06-21 ENCOUNTER — Other Ambulatory Visit: Payer: Self-pay

## 2024-06-21 ENCOUNTER — Ambulatory Visit (INDEPENDENT_AMBULATORY_CARE_PROVIDER_SITE_OTHER): Admitting: Family Medicine

## 2024-06-21 VITALS — BP 144/70 | HR 57 | Temp 97.7°F | Resp 16 | Ht 71.0 in | Wt 137.8 lb

## 2024-06-21 DIAGNOSIS — I6930 Unspecified sequelae of cerebral infarction: Secondary | ICD-10-CM

## 2024-06-21 DIAGNOSIS — I69359 Hemiplegia and hemiparesis following cerebral infarction affecting unspecified side: Secondary | ICD-10-CM

## 2024-06-21 DIAGNOSIS — N3001 Acute cystitis with hematuria: Secondary | ICD-10-CM | POA: Diagnosis not present

## 2024-06-21 MED ORDER — CLOBETASOL PROPIONATE 0.05 % EX CREA
1.0000 | TOPICAL_CREAM | Freq: Two times a day (BID) | CUTANEOUS | 3 refills | Status: DC | PRN
  Filled 2024-06-21: qty 15, 30d supply, fill #0

## 2024-06-21 NOTE — Assessment & Plan Note (Signed)
 Stable

## 2024-06-21 NOTE — Progress Notes (Signed)
 "                                                                                                                                                             Subjective:    Patient ID: William [name], male    DOB: [birth year], 84 y.o.   MRN: [id number]  Chief Complaint  Patient presents with   ED follow up    HPI Patient is in today for f/u ed.   Discussed the use of AI scribe software for clinical note transcription with the patient, who gave verbal consent to proceed.  History of Present Illness William [name] is an 84 year old male who presents for a six-month checkup following a recent syncope episode.  He experienced a syncope episode while at the kitchen table, characterized by unresponsiveness, drooping mouth, and open eyes with occasional blinking. The episode lasted longer than previous ones, leading to a call to emergency services. A CT scan showed no new findings, and troponin levels were checked twice, showing a slight decrease but not high enough to indicate a heart issue.  He has been experiencing difficulty urinating, likely due to incontinence, and noticed darkness in his urine for a couple of days. A urine test revealed blood and bacteria; a culture was sent and results are pending. He is currently on Keflex  while awaiting urine culture results. His potassium levels were elevated, and there was a significant amount of protein in his urine.  His medication regimen includes Sinemet , which was recently increased to two tablets three times a day, and lorazepam , which was increased due to nighttime issues. He has a history of hallucinations, which are often checked for infection as a cause.  He has a skin lesion on his back and has been referred to a surgeon for evaluation and potential removal. 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   HTN (hypertension) 07/19/2015   Hyperlipidemia    Kidney disease, chronic, stage III (GFR 30-59 ml/min) (HCC)    Major neurocognitive disorder 01/09/2014   Mild, related to stroke history   Nocturia    Parkinson disease (HCC)    Renal insufficiency 06/25/2013  S/P carotid endarterectomy    BILATERAL ICA--  PATENT PER DUPLEX  05-19-2012   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   Right calf   Urinary frequency    Vitamin D  deficiency     Past Surgical History:  Procedure Laterality Date   APPENDECTOMY  AS CHILD   CARDIOVASCULAR STRESS TEST  03-27-2012  DR CRENSHAW   LOW RISK LEXISCAN  STUDY-- PROBABLE NORMAL PERFUSION AND SOFT TISSUE ATTENUATION/  NO ISCHEMIA/ EF 51%   CAROTID ENDARTERECTOMY Bilateral LEFT  11-12-2008  DR GREG HAYES   RIGHT ICA  2006  ([facility])   CHOLECYSTECTOMY N/A 02/23/2022   Procedure: LAPAROSCOPIC CHOLECYSTECTOMY;  Surgeon: Lyndel Deward PARAS, MD;  Location: [facility] OR;  Service: General;  Laterality: N/A;   CYSTOSCOPY W/ RETROGRADES Bilateral 06/22/2021   Procedure: CYSTOSCOPY WITH RETROGRADE PYELOGRAM;  Surgeon: [name], MD;  Location: [facility];  Service: Urology;  Laterality: Bilateral;   CYSTOSCOPY WITH LITHOLAPAXY N/A 02/26/2013   Procedure: CYSTOSCOPY WITH LITHOLAPAXY;  Surgeon: Senior Matilda, MD;  Location: [facility];   Service: Urology;  Laterality: N/A;   ENDOSCOPIC RETROGRADE CHOLANGIOPANCREATOGRAPHY (ERCP) WITH PROPOFOL  N/A 02/22/2022   Procedure: ENDOSCOPIC RETROGRADE CHOLANGIOPANCREATOGRAPHY (ERCP) WITH PROPOFOL ;  Surgeon: [name], MD;  Location: [facility] ENDOSCOPY;  Service: Gastroenterology;  Laterality: N/A;   EYE SURGERY  Jan. 2016   cataract surgery both eyes   INGUINAL HERNIA REPAIR Right 11-08-2006   IR KYPHO EA ADDL LEVEL THORACIC OR LUMBAR  02/12/2021   IR RADIOLOGIST EVAL & MGMT  02/18/2021   MASS EXCISION N/A 03/03/2016   Procedure: EXCISION OF BACK  MASS;  Surgeon: [name], MD;  Location: [facility];  Service: General;  Laterality: N/A;   MOHS SURGERY Left 1/ 2016   [name]-- Basal cell   PROSTATE SURGERY     REMOVAL OF STONES  02/22/2022   Procedure: REMOVAL OF STONES;  Surgeon: [name], MD;  Location: [facility] ENDOSCOPY;  Service: Gastroenterology;;   [name]  02/22/2022   Procedure: [name];  Surgeon: [name], MD;  Location: [facility] ENDOSCOPY;  Service: Gastroenterology;;   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 N/A 06/22/2021   Procedure: TRANSURETHRAL RESECTION OF BLADDER TUMOR;  Surgeon: [name], MD;  Location: [facility];  Service: Urology;  Laterality: N/A;   TRANSURETHRAL RESECTION OF PROSTATE N/A 02/26/2013   Procedure: TRANSURETHRAL RESECTION OF THE PROSTATE WITH GYRUS INSTRUMENTS;  Surgeon: Senior Matilda, MD;  Location: [facility];  Service: Urology;  Laterality: N/A;   TRANSURETHRAL RESECTION OF PROSTATE N/A 06/22/2021   Procedure: TRANSURETHRAL RESECTION OF THE PROSTATE (TURP);  Surgeon: [name], MD;  Location: [facility];  Service: Urology;  Laterality: N/A;    Family History  Problem Relation Age of Onset   Heart disease Mother        CHF   Bipolar disorder Mother    Heart disease Father        CHF    Social History   Socioeconomic History   Marital  status: Married    Spouse name: Not on file   Number of children: 2   Years of education: 12   Highest education level: Associate degree: occupational, scientist, product/process development, or vocational program  Occupational History    Employer: Retired  Tobacco Use   Smoking status: Former    Current packs/day: 0.00    Average packs/day: 2.0 packs/day for 40.0 years (80.0 ttl pk-yrs)  Types: Cigarettes    Start date: 02/15/1965    Quit date: 02/15/2005    Years since quitting: 19.3   Smokeless tobacco: Never  Vaping Use   Vaping status: Never Used  Substance and Sexual Activity   Alcohol use: Not Currently   Drug use: No   Sexual activity: Not Currently    Partners: Female  Other Topics Concern   Not on file  Social History Narrative   Exercise--  Walks with assistance -- using wheelchair more than the walker       LIves with wife , no stairs in home, caffeine - one cup coffee day, exercise - not much, Right handed, 12th grade, retired      One story home   Social Drivers of Health   Tobacco Use: Medium Risk (06/21/2024)   Patient History    Smoking Tobacco Use: Former    Smokeless Tobacco Use: Never    Passive Exposure: Not on Actuary Strain: Low Risk (05/04/2024)   Overall Financial Resource Strain (CARDIA)    Difficulty of Paying Living Expenses: Not very hard  Food Insecurity: No Food Insecurity (05/04/2024)   Epic    Worried About Programme Researcher, Broadcasting/film/video in the Last Year: Never true    Ran Out of Food in the Last Year: Never true  Transportation Needs: Unknown (05/04/2024)   Epic    Lack of Transportation (Medical): No    Lack of Transportation (Non-Medical): Patient declined  Physical Activity: Unknown (05/04/2024)   Exercise Vital Sign    Days of Exercise per Week: Patient declined    Minutes of Exercise per Session: Not on file  Stress: Stress Concern Present (05/04/2024)   [employer] of Occupational Health - Occupational Stress Questionnaire    Feeling of  Stress: Rather much  Social Connections: Moderately Isolated (05/04/2024)   Social Connection and Isolation Panel    Frequency of Communication with Friends and Family: Three times a week    Frequency of Social Gatherings with Friends and Family: Never    Attends Religious Services: Never    Database Administrator or Organizations: No    Attends Engineer, Structural: Not on file    Marital Status: Married  Catering Manager Violence: Not At Risk (04/19/2023)   Humiliation, Afraid, Rape, and Kick questionnaire    Fear of Current or Ex-Partner: No    Emotionally Abused: No    Physically Abused: No    Sexually Abused: No  Depression (PHQ2-9): Low Risk (10/08/2022)   Depression (PHQ2-9)    PHQ-2 Score: 0  Alcohol Screen: Low Risk (12/19/2023)   Alcohol Screen    Last Alcohol Screening Score (AUDIT): 0  Housing: Low Risk (05/04/2024)   Epic    Unable to Pay for Housing in the Last Year: No    Number of Times Moved in the Last Year: 0    Homeless in the Last Year: No  Utilities: Not At Risk (04/19/2023)   AHC Utilities    Threatened with loss of utilities: No  Health Literacy: Not on file    Outpatient Medications Prior to Visit  Medication Sig Dispense Refill   acetaminophen  (TYLENOL ) 325 MG tablet Take 2 tablets (650 mg total) by mouth every 6 (six) hours as needed for mild pain.     amLODipine  (NORVASC ) 10 MG tablet Take 1 tablet (10 mg total) by mouth every morning. 90 tablet 1   bacitracin  ointment Apply 1 Application topically 2 (two) times daily. 120  g 0   carBAMazepine  (TEGRETOL ) 100 MG/5ML suspension Take 5 mLs (100 mg total) by mouth in the morning AND 5 mLs (100 mg total) daily at 2 PM AND 10 mLs (200 mg total) at bedtime. 600 mL 5   carbidopa -levodopa  (SINEMET  IR) 25-100 MG tablet Take 2 tablets by mouth 3 (three) times daily. 180 tablet 5   cephALEXin  (KEFLEX ) 500 MG capsule Take 1 capsule (500 mg total) by mouth 2 (two) times daily for 7 days. 14 capsule 0    clopidogrel  (PLAVIX ) 75 MG tablet Take 1 tablet (75 mg total) by mouth daily. 90 tablet 1   cyanocobalamin  (VITAMIN B12) 1000 MCG tablet Take 1 tablet (1,000 mcg total) by mouth daily. 90 tablet 1   EPINEPHrine  (EPIPEN  2-PAK) 0.3 mg/0.3 mL IJ SOAJ injection Inject 0.3 mg into the muscle as needed for anaphylaxis. 2 each 1   ferrous sulfate  325 (65 FE) MG EC tablet Take 325 mg by mouth 2 (two) times daily. *Crushed*     fluticasone  (CUTIVATE ) 0.05 % cream 1 application  at bedtime as needed (psoriasis).  3   glycerin  adult 2 g suppository Place 1 suppository rectally as needed for constipation. 12 suppository 0   hydrALAZINE  (APRESOLINE ) 25 MG tablet Take 1 tablet (25 mg total) by mouth 3 (three) times daily. 270 tablet 1   LORazepam  (ATIVAN ) 1 MG tablet Take 1 tablet (1 mg total) by mouth 3 (three) times daily. Take in the morning, at 3pm, and at bedtime 90 tablet 4   Multiple Vitamins-Minerals (ADULT ONE DAILY GUMMIES) CHEW Chew 1 tablet by mouth every morning.     NONFORMULARY OR COMPOUNDED ITEM Transport chair  dx parkinsons, hx falls 1 each 0   pantoprazole  (PROTONIX ) 40 MG tablet Take 1 tablet (40 mg total) by mouth daily. 90 tablet 1   polyethylene glycol (MIRALAX  / GLYCOLAX ) 17 g packet Take 17 g by mouth daily. 14 each 0   tamsulosin  (FLOMAX ) 0.4 MG CAPS capsule Take 1 capsule (0.4 mg total) by mouth daily after supper. (Patient taking differently: Take 0.4 mg by mouth at bedtime.) 90 capsule 1   tamsulosin  (FLOMAX ) 0.4 MG CAPS capsule Take 1 capsule (0.4 mg total) by mouth at bedtime. 90 capsule 3   traZODone  (DESYREL ) 150 MG tablet Take 0.5 tablets (75 mg total) by mouth at bedtime. *Crushed* 20 tablet 6   triamcinolone  cream (KENALOG ) 0.1 % Apply 1 application topically 2 (two) times daily. 30 g 0   clobetasol  cream (TEMOVATE ) 0.05 % Apply 1 Application to affected areas topically up to 2 (two) times daily as needed. Do not apply to face, groin, or under arms 60 g 3   Cholecalciferol   (VITAMIN D3) 50 MCG (2000 UT) CHEW Chew 1 tablet by mouth daily. (Patient not taking: Reported on 06/21/2024)     LORazepam  (ATIVAN ) 1 MG tablet Take 1/4 tablet by mouth in the morning, 1/2 tablet by mouth at lunch and 1 tablet by mouth at bedtime 60 tablet 2   LORazepam  (ATIVAN ) 1 MG tablet Take 1/4 tablet (0.25 mg total) by mouth in the morning AND 1/2 tablet (0.5 mg total) daily in the afternoon AND 1 tablet (1 mg total) at bedtime. (Patient taking differently: Take 1/4 tablet (0.25 mg total) by mouth in the morning AND 1 tab at 4 pm daily in the afternoon AND 1 tablet (1 mg total) at bedtime.) 60 tablet 4   Facility-Administered Medications Prior to Visit  Medication Dose Route Frequency Provider Last Rate  Last Admin   gemcitabine  (GEMZAR ) chemo syringe for bladder instillation 2,000 mg  2,000 mg Bladder Instillation Once Dahlstedt, Stephen, MD        Allergies[1]  Review of Systems  Constitutional:  Negative for fever and malaise/fatigue.  HENT:  Negative for congestion.   Eyes:  Negative for blurred vision.  Respiratory:  Negative for shortness of breath.   Cardiovascular:  Negative for chest pain, palpitations and leg swelling.  Gastrointestinal:  Negative for abdominal pain, blood in stool and nausea.  Genitourinary:  Negative for dysuria and frequency.  Musculoskeletal:  Negative for falls.  Skin:  Negative for rash.  Neurological:  Negative for dizziness, loss of consciousness and headaches.  Endo/Heme/Allergies:  Negative for environmental allergies.  Psychiatric/Behavioral:  Positive for depression and memory loss. The patient is nervous/anxious.        Objective:    Physical Exam Vitals and nursing note reviewed.  Constitutional:      General: He is not in acute distress.    Appearance: Normal appearance. He is well-developed.  HENT:     Head: Normocephalic and atraumatic.  Eyes:     General: No scleral icterus.       Right eye: No discharge.        Left eye: No  discharge.  Cardiovascular:     Rate and Rhythm: Normal rate and regular rhythm.     Heart sounds: No murmur heard. Pulmonary:     Effort: Pulmonary effort is normal. No respiratory distress.     Breath sounds: Normal breath sounds.  Musculoskeletal:        General: Normal range of motion.     Cervical back: Normal range of motion and neck supple.     Right lower leg: No edema.     Left lower leg: No edema.  Skin:    General: Skin is warm and dry.  Neurological:     Mental Status: He is alert and oriented to person, place, and time.  Psychiatric:        Mood and Affect: Mood normal.        Behavior: Behavior normal.        Thought Content: Thought content normal.        Judgment: Judgment normal.     BP (!) 144/70 (BP Location: Right Arm, Patient Position: Sitting, Cuff Size: Normal)   Pulse (!) 57   Temp 97.7 F (36.5 C) (Oral)   Resp 16   Ht 5' 11 (1.803 m)   Wt 137 lb 12.8 oz (62.5 kg)   SpO2 99%   BMI 19.22 kg/m  Wt Readings from Last 3 Encounters:  06/21/24 137 lb 12.8 oz (62.5 kg)  06/13/24 144 lb 6.4 oz (65.5 kg)  05/08/24 140 lb (63.5 kg)    Diabetic Foot Exam - Simple   No data filed    Lab Results  Component Value Date   WBC 9.2 06/20/2024   HGB 9.2 (L) 06/20/2024   HCT 27.0 (L) 06/20/2024   PLT 196 06/20/2024   GLUCOSE 92 06/20/2024   CHOL 182 12/20/2023   TRIG 65.0 12/20/2023   HDL 57.40 12/20/2023   LDLCALC 112 (H) 12/20/2023   ALT <5 04/30/2024   AST 11 (L) 04/30/2024   NA 141 06/20/2024   K 4.3 06/20/2024   CL 105 06/20/2024   CREATININE 1.70 (H) 06/20/2024   BUN 30 (H) 06/20/2024   CO2 23 06/20/2024   TSH 0.94 12/20/2023   PSA 1.75 06/15/2022  INR 1.0 04/18/2023   HGBA1C 4.9 04/18/2023    Lab Results  Component Value Date   TSH 0.94 12/20/2023   Lab Results  Component Value Date   WBC 9.2 06/20/2024   HGB 9.2 (L) 06/20/2024   HCT 27.0 (L) 06/20/2024   MCV 99.4 06/20/2024   PLT 196 06/20/2024   Lab Results  Component  Value Date   NA 141 06/20/2024   K 4.3 06/20/2024   CO2 23 06/20/2024   GLUCOSE 92 06/20/2024   BUN 30 (H) 06/20/2024   CREATININE 1.70 (H) 06/20/2024   BILITOT 0.3 04/30/2024   ALKPHOS 162 (H) 04/30/2024   AST 11 (L) 04/30/2024   ALT <5 04/30/2024   PROT 7.3 04/30/2024   ALBUMIN 3.9 04/30/2024   CALCIUM  8.7 (L) 06/20/2024   ANIONGAP 12 06/20/2024   EGFR 49 05/18/2022   GFR 36.09 (L) 12/20/2023   Lab Results  Component Value Date   CHOL 182 12/20/2023   Lab Results  Component Value Date   HDL 57.40 12/20/2023   Lab Results  Component Value Date   LDLCALC 112 (H) 12/20/2023   Lab Results  Component Value Date   TRIG 65.0 12/20/2023   Lab Results  Component Value Date   CHOLHDL 3 12/20/2023   Lab Results  Component Value Date   HGBA1C 4.9 04/18/2023       Assessment & Plan:  Acute cystitis with hematuria Assessment & Plan: Culture pending  Finish keflex    History of CVA with residual deficit Assessment & Plan: Stable    Hemiplegia, 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 Do not apply to face, groin, or under arms.  Dispense: 60 g; Refill: 3   Assessment and Plan Assessment & Plan Syncope   He experienced a syncope episode at the kitchen table, characterized by mouth drooping, head hanging, and unresponsiveness. His eyes were open but occasionally blinking. The episode lasted about ten minutes. A CT scan showed no new findings, and troponin levels were slightly elevated but not indicative of cardiac issues. The episode resolved, and he returned to baseline.  Urinary tract infection   A UTI is suspected due to blood and bacteria in the urine. He reports no dysuria but has difficulty urinating. Proteinuria and elevated potassium levels are present. Await urine culture results.  Continue Keflex  until results are available to guide further treatment.  Skin lesion suspicious for malignancy   A lesion on his back is suspicious for skin cancer. He has a history of basal cell carcinoma excision near the eye. He is referred to a skin surgeon, with an appointment scheduled for early March. Await evaluation and potential excision.  Chronic back pain   Chronic back pain persists. Previous treatment with a cream was mentioned.  General Health Maintenance   Routine health maintenance was discussed. Continue routine health maintenance.   Aurelie Dicenzo R Lowne Chase, DO     [1]  Allergies Allergen Reactions   Bee Venom Anaphylaxis   Strawberry Extract Hives   Latex Itching   Zetia  [Ezetimibe ] Other (See Comments)    Intolerance    Adhesive [Tape] Other (See Comments)    blisters   Statins Other (See Comments)    myalgias   "

## 2024-06-21 NOTE — Assessment & Plan Note (Signed)
 Culture pending  Finish keflex 

## 2024-06-23 ENCOUNTER — Encounter: Payer: Self-pay | Admitting: Family Medicine

## 2024-06-23 LAB — URINE CULTURE: Culture: 80000 — AB

## 2024-06-24 ENCOUNTER — Telehealth (HOSPITAL_BASED_OUTPATIENT_CLINIC_OR_DEPARTMENT_OTHER): Payer: Self-pay | Admitting: *Deleted

## 2024-06-24 NOTE — Progress Notes (Signed)
 ED Antimicrobial Stewardship Positive Culture Follow Up   William [name] is an 84 y.o. male who presented to [facility] with a chief complaint of  Chief Complaint  Patient presents with   Hypotension    Recent Results (from the past 720 hours)  Urine Culture     Status: Abnormal   Collection Time: 06/20/24  8:13 PM   Specimen: Urine, Catheterized  Result Value Ref Range Status   Specimen Description URINE, CATHETERIZED  Final   Special Requests   Final    NONE Performed at [facility], [address] [place], [place] [address]    Culture 80,000 COLONIES/mL ENTEROCOCCUS FAECALIS (A)  Final   Report Status 06/23/2024 FINAL  Final   Organism ID, Bacteria ENTEROCOCCUS FAECALIS (A)  Final      Susceptibility   Enterococcus faecalis - MIC*    AMPICILLIN  <=2 SENSITIVE Sensitive     NITROFURANTOIN <=16 SENSITIVE Sensitive     VANCOMYCIN 1 SENSITIVE Sensitive     * 80,000 COLONIES/mL ENTEROCOCCUS FAECALIS    Treated with Keflex , organism resistant to prescribed antimicrobial  New antibiotic prescription: Amoxil   ED Provider: [name], PA-C   Dorn Poot 06/24/2024, 10:20 AM Clinical Pharmacist Monday - Friday phone -  [phone] Saturday - Sunday phone - [phone]

## 2024-06-24 NOTE — Telephone Encounter (Signed)
 Post ED Visit - Positive Culture Follow-up: Successful Patient Follow-Up  Culture assessed and recommendations reviewed by:  [x]  Dorn Poot, Pharm.D. []  Venetia Gully, Pharm.D., BCPS AQ-ID []  Garrel Crews, Pharm.D., BCPS []  Almarie Lunger, Pharm.D., BCPS []  [place], [address].D., BCPS, AAHIVP []  Rosaline Bihari, Pharm.D., BCPS, AAHIVP []  Vernell Meier, PharmD, BCPS []  Latanya Hint, PharmD, BCPS []  Donald Medley, PharmD, BCPS []  Rocky Bold, PharmD  Positive urine culture  []  Patient discharged without antimicrobial prescription and treatment is now indicated [x]  Organism is resistant to prescribed ED discharge antimicrobial []  Patient with positive blood cultures  Changes discussed with ED provider: [name], PA-C New antibiotic prescription Amoxil  500mg  TID x 5 days Stop Keflex  Called to [facility] [address]. [place], [place]  Contacted patients wife date 06/24/24, time 1043   William [name] 06/24/2024, 10:42 AM

## 2024-06-26 ENCOUNTER — Ambulatory Visit

## 2024-06-26 DIAGNOSIS — M6281 Muscle weakness (generalized): Secondary | ICD-10-CM

## 2024-06-26 DIAGNOSIS — I6523 Occlusion and stenosis of bilateral carotid arteries: Secondary | ICD-10-CM

## 2024-06-26 DIAGNOSIS — R41840 Attention and concentration deficit: Secondary | ICD-10-CM

## 2024-06-26 DIAGNOSIS — R2689 Other abnormalities of gait and mobility: Secondary | ICD-10-CM

## 2024-06-26 DIAGNOSIS — R262 Difficulty in walking, not elsewhere classified: Secondary | ICD-10-CM

## 2024-06-26 DIAGNOSIS — R41841 Cognitive communication deficit: Secondary | ICD-10-CM

## 2024-06-26 DIAGNOSIS — M25512 Pain in left shoulder: Secondary | ICD-10-CM

## 2024-06-26 DIAGNOSIS — Z9181 History of falling: Secondary | ICD-10-CM

## 2024-06-26 DIAGNOSIS — I69354 Hemiplegia and hemiparesis following cerebral infarction affecting left non-dominant side: Secondary | ICD-10-CM

## 2024-06-26 DIAGNOSIS — R278 Other lack of coordination: Secondary | ICD-10-CM

## 2024-06-26 DIAGNOSIS — R296 Repeated falls: Secondary | ICD-10-CM

## 2024-06-26 DIAGNOSIS — I69318 Other symptoms and signs involving cognitive functions following cerebral infarction: Secondary | ICD-10-CM

## 2024-06-26 NOTE — Therapy (Signed)
 "  OUTPATIENT PHYSICAL THERAPY NEURO TREATMENT      Patient Name: William [name] MRN: [id number] DOB:May 10, 1941, 84 y.o., male Today's Date: 06/26/2024  PCP: [name]  DO REFERRING PROVIDER: same   END OF SESSION:  PT End of Session - 06/26/24 1644     Visit Number [id number]    Date for Recertification  08/11/24    Authorization Type HTA    PT Start Time 1630    PT Stop Time 1715    PT Time Calculation (min) 45 min    Activity Tolerance Patient tolerated treatment well    Behavior During Therapy [facility] for tasks assessed/performed          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   HTN (hypertension) 07/19/2015   Hyperlipidemia    Kidney disease, chronic, stage III (GFR 30-59 ml/min) (HCC)    Major neurocognitive disorder 01/09/2014   Mild, related to stroke history   Nocturia    Parkinson disease (HCC)    Renal insufficiency 06/25/2013   S/P carotid endarterectomy    BILATERAL ICA--  PATENT PER DUPLEX  05-19-2012   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 09/26/2017   Right calf   Urinary frequency    Vitamin D  deficiency    Past Surgical History:  Procedure Laterality Date   APPENDECTOMY  AS CHILD   CARDIOVASCULAR STRESS TEST  03-27-2012  DR CRENSHAW   LOW RISK LEXISCAN  STUDY-- PROBABLE NORMAL PERFUSION AND SOFT TISSUE ATTENUATION/  NO ISCHEMIA/ EF 51%   CAROTID ENDARTERECTOMY Bilateral LEFT  11-12-2008  DR GREG HAYES   RIGHT ICA  2006  ([facility])   CHOLECYSTECTOMY N/A 02/23/2022   Procedure: LAPAROSCOPIC CHOLECYSTECTOMY;  Surgeon: [name],  MD;  Location: [facility] OR;  Service: General;  Laterality: N/A;   CYSTOSCOPY W/ RETROGRADES Bilateral 06/22/2021   Procedure: CYSTOSCOPY WITH RETROGRADE PYELOGRAM;  Surgeon: [name], MD;  Location: [facility];  Service: Urology;  Laterality: Bilateral;   CYSTOSCOPY WITH LITHOLAPAXY N/A 02/26/2013   Procedure: CYSTOSCOPY WITH LITHOLAPAXY;  Surgeon: Senior Matilda, MD;  Location: [facility];  Service: Urology;  Laterality: N/A;   ENDOSCOPIC RETROGRADE CHOLANGIOPANCREATOGRAPHY (ERCP) WITH PROPOFOL  N/A 02/22/2022   Procedure: ENDOSCOPIC RETROGRADE CHOLANGIOPANCREATOGRAPHY (ERCP) WITH PROPOFOL ;  Surgeon: [name], MD;  Location: [facility] ENDOSCOPY;  Service: Gastroenterology;  Laterality: N/A;   EYE SURGERY  Jan. 2016   cataract surgery both eyes   INGUINAL HERNIA REPAIR Right 11-08-2006   IR KYPHO EA ADDL LEVEL THORACIC OR LUMBAR  02/12/2021   IR RADIOLOGIST EVAL & MGMT  02/18/2021   MASS EXCISION N/A 03/03/2016   Procedure: EXCISION OF BACK  MASS;  Surgeon: [name], MD;  Location: [facility];  Service: General;  Laterality: N/A;   MOHS SURGERY Left 1/ 2016   [name]-- Basal cell   PROSTATE SURGERY     REMOVAL OF STONES  02/22/2022   Procedure: REMOVAL OF STONES;  Surgeon: [name], MD;  Location: [facility] ENDOSCOPY;  Service: Gastroenterology;;   [name]  02/22/2022   Procedure: [name];  Surgeon: [name], MD;  Location: [facility] ENDOSCOPY;  Service: Gastroenterology;;   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 N/A 06/22/2021   Procedure: TRANSURETHRAL RESECTION OF BLADDER TUMOR;  Surgeon: [name], MD;  Location: [facility];  Service: Urology;  Laterality: N/A;   TRANSURETHRAL RESECTION OF PROSTATE N/A 02/26/2013   Procedure: TRANSURETHRAL RESECTION OF THE PROSTATE WITH GYRUS INSTRUMENTS;  Surgeon: Senior Matilda, MD;  Location: [facility]  Service: Urology;  Laterality:  N/A;   TRANSURETHRAL RESECTION OF PROSTATE N/A 06/22/2021   Procedure: TRANSURETHRAL RESECTION OF THE PROSTATE (TURP);  Surgeon: [name], MD;  Location: [facility];  Service: Urology;  Laterality: N/A;   Patient Active Problem List   Diagnosis Date Noted   Hx of constipation 05/08/2024   Bronchitis 06/16/2023   Impacted cerumen of left ear 06/16/2023   Acute encephalopathy 04/18/2023   Parkinson's disease (HCC) 04/18/2023   History of CVA with residual deficit 04/18/2023   Chronic kidney disease, stage 3b (HCC) 04/18/2023   Dementia with behavioral disturbance (HCC) 04/18/2023   Anemia of chronic disease 04/18/2023   Laceration of right upper extremity 02/24/2023   Dermatitis 02/24/2023   Lung nodules 02/24/2023   Dysuria 08/03/2022   Nonintractable headache 07/01/2022   Bilateral impacted cerumen 06/24/2022   Rash 06/15/2022   Postoperative ileus (HCC) 02/27/2022   Ileus, postoperative (HCC) 02/26/2022   Choledocholithiasis 02/19/2022   DNR (do not resuscitate) 02/19/2022   Left elbow pain 01/26/2022   Mid back pain on left side 01/26/2022   Rib pain 01/26/2022   Acute pain of left shoulder 11/12/2021   Leukocytes in urine 11/12/2021   Urinary frequency 11/12/2021   Thrush 10/08/2021   Hemiplegia, 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   Insomnia    Prediabetes    Acute 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 (HCC)    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   Transaminitis 07/27/2021   UTI (urinary tract infection) 07/27/2021   CVA (cerebral vascular accident) (HCC) 07/27/2021   Fall 07/27/2021   Hyperglycemia 07/27/2021   Cholelithiasis 07/27/2021   Hypoxia 07/27/2021   Nausea and vomiting 07/27/2021   Acute metabolic encephalopathy 07/27/2021   Normocytic anemia 07/27/2021   Chronic back pain 07/27/2021   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   Closed fracture  of first lumbar vertebra with routine healing 02/03/2021   Closed fracture of multiple ribs 11/18/2020   Anxiety 01/29/2020   Leg pain, bilateral 01/29/2020   Ingrown toenail 07/13/2019   Lumbar spondylosis 05/02/2018   Pain in left knee 03/09/2018   Osteoarthritis of left hip 01/16/2018   Trochanteric bursitis of left hip 01/16/2018   Preventative health care 09/26/2017   HTN (hypertension) 07/19/2015   Hyperlipidemia 07/19/2015   Great toe pain 02/11/2014   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   Obesity (BMI 30-39.9) 06/25/2013   Renal insufficiency 06/25/2013   Weakness of left arm 06/25/2013   Sebaceous cyst 03/03/2011   Sprain of lumbar region 07/31/2010   Rib pain, left 08/29/2009   Carotid artery stenosis, asymptomatic, bilateral 05/02/2009   Eczema, atopic 05/31/2008   Vitamin D  deficiency 03/01/2008   BPH (benign prostatic hyperplasia) 08/06/2007   Fasting hyperglycemia 12/21/2006   History of right MCA infarct 2006    ONSET DATE: 06/29/22 REFERRING DIAG:  Diagnosis  I63.9 (ICD-10-CM) - Cerebrovascular accident (CVA), unspecified mechanism (HCC)  T80.KKKI (ICD-10-CM) - Fall, subsequent encounter    THERAPY DIAG:  Muscle weakness (generalized)  Acute pain of left shoulder  Other lack of coordination  History of falling  Attention and concentration deficit  Other abnormalities of gait and mobility  Repeated falls  Cognitive communication deficit  Carotid artery stenosis, asymptomatic, bilateral  Difficulty in walking, not elsewhere classified  Other symptoms and signs involving cognitive functions following cerebral infarction  Hemiplegia and hemiparesis following cerebral infarction affecting left non-dominant side (HCC)  Rationale for Evaluation and Treatment: Rehabilitation  SUBJECTIVE:                                                                                                                                                                                          SUBJECTIVE STATEMENT:  Pts' wife reports no new issues,no falls. Pt expressed some soreness in RUE shoulder and says it always feels like that   PERTINENT HISTORY:  William [name] is a 84 year old man with dementia, CKD, HTN, CAD, HLD and history of CVA  (2006, 2023), Parkinson's  PAIN:  Are you having pain? Faces/behavior scale- mild pain BLEs  and back   PRECAUTIONS: Fall  WEIGHT BEARING RESTRICTIONS: No  FALLS: Has patient fallen in last 6 months? No  LIVING ENVIRONMENT: Lives with: lives with their family and lives with their spouse Lives in: House/apartment Stairs: 1 brick high step Has following equipment at home: Environmental Consultant - 2 wheeled, Wheelchair (manual), Shower bench, bed side commode, Grab bars, and hospital bed, sliding pad for car  PLOF: Independent with basic ADLs and Independent with household mobility without device  PATIENT GOALS: Walk around the block with his wife. Be able to use the dining room chair rather than have to use the W/C. Increased I with bed mobility and simple tasks at home like make some coffee or brush his teeth, Step over tub to shower, has bench, but he can't really use it. Up and down the one step to get to back deck. 11/23/22: Patient's wife Devere updated his goals today: Walk around store (gets distracted), swing a golf club again (thinking of driving range), get up on his own, walk without leaning forward, more recognition of the left side (from CVA), not need gait belt, OT-- be able to get dressed more on his own, navigate the newspaper.  OBJECTIVE:   TREATMENT: 06/26/24 NuStep L3 x  Bike L2.0 x 4 min  Modified sit ups Colored Cone find and reach  Cone reaching BUE Toe Taps 4 step  Seated ball kicks  Ambulation 6 laps (600') seated rest in between [facility] w/ gait belt  06/20/23 Nustep level 5 x 8 minutes Bike level 4 x 6 mintues UBE level 4 x 5 minutes Standing ball toss Ball bounce [employer] Gait  outside HHA around 1/2 the parking [place] then up and wround to the front of the building Seated crunches  06/12/24 Nustep level 5 x 8 minutes TUG 16 seconds Bike level 4 x 5 minutes Ball toss  Ball bounce Ball kicks Side stepping HHA Backward walk HHA HHA walking 400 feet HHA walking 400 feet, more shuffling and needed more assist  06/05/24 Nustep level 5 x 8 minutes Gait outside with HHA 1/2 parking island then rest and then around building to front LAQ 2.5# 2.5# marches Standing ball toss, ball bounce and ball kicks LArge ball toss Red tband left hip abduction Elbow touches Standing with SPC, 6 toe touches Reaching for cone and then sitting up tall Gait out back door to the car  05/22/24 NuStep x6 mins level 5 UBE x5 mins  Seated physioball push with therapist with multi directional pressures Standing ball pass with BLE involvement and cues to inc use of LLE for returns  Knee flexion extension on physioball x30 Seated hip extension on small ball with UE on knee and axial pressure through LLE x10 reps, 5 sec hold   05/08/24 NuStep L5x28mins  Bike L3 x28mins  UBE L2 x4 mins  LAQ 2# 2x10 Marching 2#  Kicking cones  Standing bouncing ball   05/03/24 Nustep level 4 x 6 minutes Bike level 4 x 5 minutes Gait outside with HHA and gait belt 1/2 parking [place] and then rest, then walk in the grass around the side of the building to the front UBE level 3 x 4 minutes LAQ Marches Standing ball toss and bounce Gait with HHA x 200 feet and then out to the car  04/26/24 Tug 15 seconds Gait outside around 1/2 parking [place] and then through grass to the front door Partial sit ups Cone reaching with trunk rotation and elongation and to the floor, PT call out color and he would reach out and or kick Standing fall catch, bounce or kick fairly rapidly Bike 5 minutes  04/17/24 Nustep level 5 x 5 minutes Bike level 4 x 5 minutes Gait outside with HHA up and down curbs,  slopes all the way around the back building a rest at a table in the back, up the hill a standing rest and then to the table int he island to rest again, really struggled coming up the hill. Gait out to the car  04/10/24 Nustep lvl 5 for 6 min LE SciFit lvl 4 for 5 min UBE Bike lvl 3 2 min fwd, 2 min bkwd Ambulating around the clinic to find cones for cardiovascular endurance, functional mobility, and visual scanning.  Car and toilet transfers  04/05/24 Nustep level 5 x 6 minutes STM to the left upper trap Bike level 4 x 6 minutes Standing reaching for numbers called out up to a sequence of 4 Standing ball bouncing Gait outside around parking [place], rest and then around to the front, then 200 feet inside and out to car  03/27/24 Nustep level 5 x 6 minutes Bike level 5 x 5 minutes STM with the tgun to the left upper trap area Passive ROM to the left shoulder all motions to his tolerance UBE level 4 x 4 minutes Gait outside with HHA 1/2 the parking [place] and then rest and then around to the front of the building on the smooth  surface Seated left LAQ, marches Standing ball bouncing Standing HHA side stepping and backward walking  03/20/24 Nustep level 5 x 6 minutes Bike level 4 x 5 minutes UBE level 3 x 4 minutes Gait outside 1/2 around the parking island rest and then through grass back to the front of the building some cues for step length Seated partial sit ups, worked on some trunk mobility trying to get him to lean side to side.  Ball bouncing in sitting and in standing.  Side stepping, gait 2 laps with HHA PROM of the left shoulder  03/15/2024 Nustep level 5 x 5 minutes Bike level 4 x 5 minutes Dribbling ball Partial sit ups Ball kicks Walked out side with HHA 1/2 way around parking [place] and then rest and then from there to the front door through some deep grass, this was very difficult for him, needed a lot of cues to take bigger steps and needed more assist, once we  did this he had a hard time getting his feet and steps back. Did 240 feet HHA walking in the clinic and then to the car after this to get the stepping pattern back  PATIENT EDUCATION: Education details: none new 02/16/23 Person educated: Patient and Spouse Education method: Explanation, Demonstration, Tactile cues, and Verbal cues Education comprehension: verbalized understanding, returned demonstration, verbal cues required, tactile cues required, and needs further education   ------------------------------------------------------------------------------------------------------- (Measures in this section from initial evaluation unless otherwise noted) DIAGNOSTIC FINDINGS:  MRI 2021 with degenerative changes in lumbar spine, L3-4 R subarticular stenosis COGNITION: Overall cognitive status: History of cognitive impairments - at baseline SENSATION: Not tested COORDINATION: Moderately impaired BLE MUSCLE LENGTH: Hamstrings: severely restricted B Thomas test: Severely restricted B. POSTURE: rounded shoulders, forward head, increased thoracic kyphosis, posterior pelvic tilt, and flexed trunk  LOWER EXTREMITY ROM:   BLE extremely stiff throughout, limited hip ROM in all planes, B knees limited in extension. LOWER EXTREMITY MMT:  3+/5 throughout. Unable to determine accuracy of MMT due to cognition. Functionally demosntrates poor coordinated activation and poor muscular endurance. BED MOBILITY: Min A for Supine<> Sit. Patient was using a ladder in his hospital bed and could move MI. TRANSFERS: Min A, mod VC, tends to lean back.  11/02/22 independent with use of hands on arm chair with a couple of tries CURB: Min A-Has one step out to his deck. STAIRS: N/A  GAIT: Gait pattern: step to pattern, decreased arm swing- Right, decreased arm swing- Left, decreased step length- Right, decreased step length- Left, shuffling, festinating, trunk flexed, narrow BOS, poor foot clearance- Right, and poor foot  clearance- Left Distance walked: 60' Assistive device utilized: Environmental Consultant - 2 wheeled Level of assistance: Occasional min A Comments: mod VC to stay close to walker, take longer steps. FUNCTIONAL TESTS:  5 times sit to stand: 38.23  09/29/22  could not performs 5XSTS, 11/02/22 = 47 seconds with cues to use arms, 07/21/23 really struggled today 55 seconds Timed up and go (TUG): 47.91 with RW, 07/21/22 TUG 29  seconds without device, 09/29/22 25 seconds, 11/02/22 TUG 20 seconds, 07/21/23 = 26 seconds no device SBA  PATIENT EDUCATION: Education details: Progress towards goals, POC Person educated: Patient and Spouse Education method: Explanation, Demonstration, and Verbal cues  Education comprehension: verbalized understanding, returned demonstration, verbal cues required, and needs further education  HOME EXERCISE PROGRAM: PWR! moves  GOALS:  SHORT TERM GOALS: Target date: 01/25/24   Pt will perform HEP to maintain lower body strength, flexibility, posture with wife's assistance. Baseline:  Goal status: met 04/12/23  2. Pt will perform sit to stand, 8 of 10 reps with BUE support, with supervision, to maintain safety with transfers.  Baseline: min guard  Goal status: 02/07/23-CGA from elevated surface and max VC to lean forward, ongoing  continues to need a lot of cues for hand placement and to lean forward, tends to lean backward, needs cues and some assistance 02/28/24; 04/11/24 - requires CGA-MinA with Sit>Stand, can transfer with SBA and VC's 04/26/24  LONG TERM GOALS: Target date: 04/10/24   Patient will perform HEP progression to maintain lower body strength, flexibility, posture with wife's assistance. Baseline:  Goal status: has been able to walk with wife multiple times a day 03/27/24; pt states compliance to HEP and walking to and from the mailbox w wife every day 04/11/24  2.  Pt will ambulate 150-175 ft in 2 MWT with min  guard assist, to preserve functional mobility within the home.  Baseline: Goal status:  met 08/25/23  3. Wife will verbalize tips for fall prevention education to minimize fall frequency Baseline:  Goal status:progressing and doing well 4.  Patient will be able to toilet without freezing for over a 4 week period met 11/15/23  5.  Patient will be able to get out of bed from supine with wife assist minimal Assist,met 10/06/23   ASSESSMENT:  CLINICAL IMPRESSION:  Pt with ability to walk with HHA and CGA with gait belt today with decreased shuffling steps; pt pacing during ambulation was more controlled today. Pt with some soreness in the LUE shoulder but able to participate in cone reaching with use of BUEs. Session continued to focus on big movements and general functional mobility patterns. Wife is pleased that the has been able to maintain his function.   OBJECTIVE IMPAIRMENTS: Abnormal gait, decreased activity tolerance, decreased balance, decreased cognition, decreased coordination, decreased endurance, decreased mobility, difficulty walking, decreased ROM, decreased strength, decreased safety awareness, impaired flexibility, impaired UE functional use, improper body mechanics, and postural dysfunction.   PLAN:  PT FREQUENCY: 1x/week  PT DURATION: 12 weeks  PLANNED INTERVENTIONS: Therapeutic exercises, Therapeutic activity, Neuromuscular re-education, Balance training, Gait training, Patient/Family education, Self Care, Joint mobilization, Stair training, Moist heat, and Manual therapy  PLAN FOR NEXT SESSION:   Will continue to work with patient to help him and his wife stay living at home and with less difficulty   Patient Details  Name: William [name] MRN: [id number] Date of Birth: 07-Feb-1941 Referring Provider:  [name], DO  [place], Student-PT 06/26/2024 5:16 PM   "

## 2024-06-28 ENCOUNTER — Inpatient Hospital Stay (HOSPITAL_COMMUNITY)
Admission: EM | Admit: 2024-06-28 | Discharge: 2024-07-02 | DRG: 871 | Disposition: A | Discharge status: Home Health | Attending: Internal Medicine | Admitting: Internal Medicine

## 2024-06-28 ENCOUNTER — Ambulatory Visit: Admitting: Podiatry

## 2024-06-28 ENCOUNTER — Emergency Department (HOSPITAL_COMMUNITY)

## 2024-06-28 ENCOUNTER — Encounter: Payer: Self-pay | Admitting: Podiatry

## 2024-06-28 ENCOUNTER — Encounter: Payer: Self-pay | Admitting: Neurology

## 2024-06-28 ENCOUNTER — Other Ambulatory Visit: Payer: Self-pay

## 2024-06-28 VITALS — Ht 71.0 in | Wt 137.8 lb

## 2024-06-28 DIAGNOSIS — I4510 Unspecified right bundle-branch block: Secondary | ICD-10-CM | POA: Diagnosis present

## 2024-06-28 DIAGNOSIS — B351 Tinea unguium: Secondary | ICD-10-CM | POA: Diagnosis not present

## 2024-06-28 DIAGNOSIS — Z91018 Allergy to other foods: Secondary | ICD-10-CM

## 2024-06-28 DIAGNOSIS — J690 Pneumonitis due to inhalation of food and vomit: Secondary | ICD-10-CM | POA: Diagnosis present

## 2024-06-28 DIAGNOSIS — Z87891 Personal history of nicotine dependence: Secondary | ICD-10-CM

## 2024-06-28 DIAGNOSIS — Z79899 Other long term (current) drug therapy: Secondary | ICD-10-CM

## 2024-06-28 DIAGNOSIS — Z9049 Acquired absence of other specified parts of digestive tract: Secondary | ICD-10-CM

## 2024-06-28 DIAGNOSIS — Z9842 Cataract extraction status, left eye: Secondary | ICD-10-CM

## 2024-06-28 DIAGNOSIS — R1310 Dysphagia, unspecified: Secondary | ICD-10-CM | POA: Diagnosis present

## 2024-06-28 DIAGNOSIS — Z7901 Long term (current) use of anticoagulants: Secondary | ICD-10-CM

## 2024-06-28 DIAGNOSIS — Z9181 History of falling: Secondary | ICD-10-CM

## 2024-06-28 DIAGNOSIS — Z87892 Personal history of anaphylaxis: Secondary | ICD-10-CM

## 2024-06-28 DIAGNOSIS — E785 Hyperlipidemia, unspecified: Secondary | ICD-10-CM | POA: Diagnosis present

## 2024-06-28 DIAGNOSIS — G9341 Metabolic encephalopathy: Secondary | ICD-10-CM | POA: Diagnosis present

## 2024-06-28 DIAGNOSIS — F0280 Dementia in other diseases classified elsewhere without behavioral disturbance: Secondary | ICD-10-CM | POA: Diagnosis present

## 2024-06-28 DIAGNOSIS — K219 Gastro-esophageal reflux disease without esophagitis: Secondary | ICD-10-CM | POA: Diagnosis present

## 2024-06-28 DIAGNOSIS — Z8249 Family history of ischemic heart disease and other diseases of the circulatory system: Secondary | ICD-10-CM

## 2024-06-28 DIAGNOSIS — Z7902 Long term (current) use of antithrombotics/antiplatelets: Secondary | ICD-10-CM

## 2024-06-28 DIAGNOSIS — I2510 Atherosclerotic heart disease of native coronary artery without angina pectoris: Secondary | ICD-10-CM | POA: Diagnosis present

## 2024-06-28 DIAGNOSIS — N400 Enlarged prostate without lower urinary tract symptoms: Secondary | ICD-10-CM | POA: Diagnosis present

## 2024-06-28 DIAGNOSIS — J189 Pneumonia, unspecified organism: Secondary | ICD-10-CM | POA: Diagnosis present

## 2024-06-28 DIAGNOSIS — Z9841 Cataract extraction status, right eye: Secondary | ICD-10-CM

## 2024-06-28 DIAGNOSIS — G20A1 Parkinson's disease without dyskinesia, without mention of fluctuations: Secondary | ICD-10-CM | POA: Diagnosis present

## 2024-06-28 DIAGNOSIS — A419 Sepsis, unspecified organism: Principal | ICD-10-CM | POA: Diagnosis present

## 2024-06-28 DIAGNOSIS — K5909 Other constipation: Secondary | ICD-10-CM | POA: Diagnosis present

## 2024-06-28 DIAGNOSIS — Z818 Family history of other mental and behavioral disorders: Secondary | ICD-10-CM

## 2024-06-28 DIAGNOSIS — N390 Urinary tract infection, site not specified: Secondary | ICD-10-CM | POA: Diagnosis present

## 2024-06-28 DIAGNOSIS — Z888 Allergy status to other drugs, medicaments and biological substances status: Secondary | ICD-10-CM

## 2024-06-28 DIAGNOSIS — M79676 Pain in unspecified toe(s): Secondary | ICD-10-CM

## 2024-06-28 DIAGNOSIS — Z9079 Acquired absence of other genital organ(s): Secondary | ICD-10-CM

## 2024-06-28 DIAGNOSIS — Z9889 Other specified postprocedural states: Secondary | ICD-10-CM

## 2024-06-28 DIAGNOSIS — Z66 Do not resuscitate: Secondary | ICD-10-CM | POA: Diagnosis present

## 2024-06-28 DIAGNOSIS — I129 Hypertensive chronic kidney disease with stage 1 through stage 4 chronic kidney disease, or unspecified chronic kidney disease: Secondary | ICD-10-CM | POA: Diagnosis present

## 2024-06-28 DIAGNOSIS — Z515 Encounter for palliative care: Secondary | ICD-10-CM

## 2024-06-28 DIAGNOSIS — R079 Chest pain, unspecified: Secondary | ICD-10-CM | POA: Insufficient documentation

## 2024-06-28 DIAGNOSIS — I69354 Hemiplegia and hemiparesis following cerebral infarction affecting left non-dominant side: Secondary | ICD-10-CM

## 2024-06-28 DIAGNOSIS — Z91030 Bee allergy status: Secondary | ICD-10-CM

## 2024-06-28 DIAGNOSIS — F0150 Vascular dementia without behavioral disturbance: Secondary | ICD-10-CM | POA: Diagnosis present

## 2024-06-28 DIAGNOSIS — M47816 Spondylosis without myelopathy or radiculopathy, lumbar region: Secondary | ICD-10-CM | POA: Diagnosis present

## 2024-06-28 DIAGNOSIS — M6250 Muscle wasting and atrophy, not elsewhere classified, unspecified site: Secondary | ICD-10-CM | POA: Diagnosis present

## 2024-06-28 DIAGNOSIS — D638 Anemia in other chronic diseases classified elsewhere: Secondary | ICD-10-CM | POA: Diagnosis present

## 2024-06-28 DIAGNOSIS — N1832 Chronic kidney disease, stage 3b: Secondary | ICD-10-CM | POA: Diagnosis present

## 2024-06-28 DIAGNOSIS — E559 Vitamin D deficiency, unspecified: Secondary | ICD-10-CM | POA: Diagnosis present

## 2024-06-28 DIAGNOSIS — Z85828 Personal history of other malignant neoplasm of skin: Secondary | ICD-10-CM

## 2024-06-28 DIAGNOSIS — Z91040 Latex allergy status: Secondary | ICD-10-CM

## 2024-06-28 DIAGNOSIS — Z1152 Encounter for screening for COVID-19: Secondary | ICD-10-CM

## 2024-06-28 DIAGNOSIS — R54 Age-related physical debility: Secondary | ICD-10-CM | POA: Diagnosis present

## 2024-06-28 LAB — CBC WITH DIFFERENTIAL/PLATELET
Abs Immature Granulocytes: 0.07 K/uL (ref 0.00–0.07)
Basophils Absolute: 0 K/uL (ref 0.0–0.1)
Basophils Relative: 0 %
Eosinophils Absolute: 0 K/uL (ref 0.0–0.5)
Eosinophils Relative: 0 %
HCT: 33.8 % — ABNORMAL LOW (ref 39.0–52.0)
Hemoglobin: 11.2 g/dL — ABNORMAL LOW (ref 13.0–17.0)
Immature Granulocytes: 0 %
Lymphocytes Relative: 2 %
Lymphs Abs: 0.3 K/uL — ABNORMAL LOW (ref 0.7–4.0)
MCH: 33.1 pg (ref 26.0–34.0)
MCHC: 33.1 g/dL (ref 30.0–36.0)
MCV: 100 fL (ref 80.0–100.0)
Monocytes Absolute: 1 K/uL (ref 0.1–1.0)
Monocytes Relative: 6 %
Neutro Abs: 16 K/uL — ABNORMAL HIGH (ref 1.7–7.7)
Neutrophils Relative %: 92 %
Platelets: 217 K/uL (ref 150–400)
RBC: 3.38 MIL/uL — ABNORMAL LOW (ref 4.22–5.81)
RDW: 13.4 % (ref 11.5–15.5)
WBC: 17.5 K/uL — ABNORMAL HIGH (ref 4.0–10.5)
nRBC: 0 % (ref 0.0–0.2)

## 2024-06-28 LAB — URINALYSIS, W/ REFLEX TO CULTURE (INFECTION SUSPECTED)
Bacteria, UA: NONE SEEN
Bilirubin Urine: NEGATIVE
Glucose, UA: NEGATIVE mg/dL
Ketones, ur: NEGATIVE mg/dL
Leukocytes,Ua: NEGATIVE
Nitrite: NEGATIVE
Protein, ur: 100 mg/dL — AB
Specific Gravity, Urine: 1.02 (ref 1.005–1.030)
pH: 6 (ref 5.0–8.0)

## 2024-06-28 LAB — COMPREHENSIVE METABOLIC PANEL WITH GFR
ALT: <5 U/L (ref 0–44)
AST: 18 U/L (ref 15–41)
Albumin: 3.6 g/dL (ref 3.5–5.0)
Alkaline Phosphatase: 112 U/L (ref 38–126)
Anion gap: 12 (ref 5–15)
BUN: 35 mg/dL — ABNORMAL HIGH (ref 8–23)
CO2: 24 mmol/L (ref 22–32)
Calcium: 8.6 mg/dL — ABNORMAL LOW (ref 8.9–10.3)
Chloride: 106 mmol/L (ref 98–111)
Creatinine, Ser: 1.75 mg/dL — ABNORMAL HIGH (ref 0.61–1.24)
GFR, Estimated: 39 mL/min — ABNORMAL LOW
Glucose, Bld: 136 mg/dL — ABNORMAL HIGH (ref 70–99)
Potassium: 4.6 mmol/L (ref 3.5–5.1)
Sodium: 142 mmol/L (ref 135–145)
Total Bilirubin: 0.2 mg/dL (ref 0.0–1.2)
Total Protein: 6.8 g/dL (ref 6.5–8.1)

## 2024-06-28 LAB — RESP PANEL BY RT-PCR (RSV, FLU A&B, COVID)  RVPGX2
Influenza A by PCR: NEGATIVE
Influenza B by PCR: NEGATIVE
Resp Syncytial Virus by PCR: NEGATIVE
SARS Coronavirus 2 by RT PCR: NEGATIVE

## 2024-06-28 LAB — PROTIME-INR
INR: 1 (ref 0.8–1.2)
Prothrombin Time: 13.6 s (ref 11.4–15.2)

## 2024-06-28 LAB — I-STAT CG4 LACTIC ACID, ED: Lactic Acid, Venous: 1.9 mmol/L (ref 0.5–1.9)

## 2024-06-28 MED ORDER — SODIUM CHLORIDE 0.9 % IV SOLN
2.0000 g | Freq: Once | INTRAVENOUS | Status: AC
  Administered 2024-06-28: 2 g via INTRAVENOUS
  Filled 2024-06-28: qty 12.5

## 2024-06-28 MED ORDER — ACETAMINOPHEN 500 MG PO TABS
1000.0000 mg | ORAL_TABLET | Freq: Once | ORAL | Status: DC
  Filled 2024-06-28: qty 2

## 2024-06-28 MED ORDER — LACTATED RINGERS IV BOLUS (SEPSIS)
1000.0000 mL | Freq: Once | INTRAVENOUS | Status: AC
  Administered 2024-06-28: 1000 mL via INTRAVENOUS

## 2024-06-28 MED ORDER — LACTATED RINGERS IV BOLUS (SEPSIS)
500.0000 mL | Freq: Once | INTRAVENOUS | Status: AC
  Administered 2024-06-28: 500 mL via INTRAVENOUS

## 2024-06-28 MED ORDER — ACETAMINOPHEN 650 MG RE SUPP
650.0000 mg | Freq: Once | RECTAL | Status: AC
  Administered 2024-06-28: 650 mg via RECTAL
  Filled 2024-06-28: qty 1

## 2024-06-28 NOTE — ED Triage Notes (Signed)
 Pt bibgcems from home pts family states pt not feeling well x2 days currently being treated for uti. Family states pt not acting himself.    170/90 101.4 aux 118 hr  27 rr

## 2024-06-28 NOTE — ED Provider Notes (Signed)
 " [facility] Provider Note   CSN: [id number] Arrival date & time: 06/28/24  2038     Patient presents with: No chief complaint on file.   William [name] is a 84 y.o. male with past medical history of vascular dementia, CKD, hypertension, CAD, hyperlipidemia who presents today for evaluation concerns for sepsis.  Patient is confused on my assessment and unable to contribute history.  Per EMS patient has been treated for UTI for the past several days with Keflex .  Had worsening altered mental status as well as fevers at his facility.  On arrival patient was noted to be tachycardic as well as febrile with EMS.  Did receive 500 cc fluid bolus.  Per wife patient at baseline is confused however conversational.   HPI     Prior to Admission medications  Medication Sig Start Date End Date Taking? Authorizing Provider  acetaminophen  (TYLENOL ) 325 MG tablet Take 2 tablets (650 mg total) by mouth every 6 (six) hours as needed for mild pain. 02/24/22   Rosalba Glendale DEL, PA-C  amLODipine  (NORVASC ) 10 MG tablet Take 1 tablet (10 mg total) by mouth every morning. 12/29/23   Lowne Chase, Yvonne R, DO  bacitracin  ointment Apply 1 Application topically 2 (two) times daily. 04/24/23   Christopher Savannah, PA-C  carBAMazepine  (TEGRETOL ) 100 MG/5ML suspension Take 5 mLs (100 mg total) by mouth in the morning AND 5 mLs (100 mg total) daily at 2 PM AND 10 mLs (200 mg total) at bedtime. 04/10/24   Plovsky, Elna, MD  carbidopa -levodopa  (SINEMET  IR) 25-100 MG tablet Take 2 tablets by mouth 3 (three) times daily. 06/13/24   Skeet Juliene SAUNDERS, DO  cephALEXin  (KEFLEX ) 500 MG capsule Take 1 capsule (500 mg total) by mouth 2 (two) times daily for 7 days. 06/20/24 06/28/24  Hinnant, Collin F, PA-C  Cholecalciferol  (VITAMIN D3) 50 MCG (2000 UT) CHEW Chew 1 tablet by mouth daily. Patient not taking: Reported on 06/28/2024    [provider]  clobetasol  cream (TEMOVATE ) 0.05 % Apply 1  application to affected areas topically up to 2 (two) times daily as needed. Do not apply to face, groin, or under arms. 06/21/24   [name] Jamee SAUNDERS, DO  clopidogrel  (PLAVIX ) 75 MG tablet Take 1 tablet (75 mg total) by mouth daily. 03/02/24   [name] Jamee SAUNDERS, DO  cyanocobalamin  (VITAMIN B12) 1000 MCG tablet Take 1 tablet (1,000 mcg total) by mouth daily. 03/02/24   [name] Jamee SAUNDERS, DO  EPINEPHrine  (EPIPEN  2-PAK) 0.3 mg/0.3 mL IJ SOAJ injection Inject 0.3 mg into the muscle as needed for anaphylaxis. 12/20/23   [name] Jamee SAUNDERS, DO  ferrous sulfate  325 (65 FE) MG EC tablet Take 325 mg by mouth 2 (two) times daily. *Crushed*    [provider]  fluticasone  (CUTIVATE ) 0.05 % cream 1 application  at bedtime as needed (psoriasis). 12/16/16   [provider]  glycerin  adult 2 g suppository Place 1 suppository rectally as needed for constipation. 03/10/24   Christopher Savannah, PA-C  hydrALAZINE  (APRESOLINE ) 25 MG tablet Take 1 tablet (25 mg total) by mouth 3 (three) times daily. 02/20/24   [name] Jamee SAUNDERS, DO  LORazepam  (ATIVAN ) 1 MG tablet Take 1 tablet (1 mg total) by mouth 3 (three) times daily. Take in the morning, at 3pm, and at bedtime 05/29/24   Plovsky, Elna, MD  Multiple Vitamins-Minerals (ADULT ONE DAILY GUMMIES) CHEW Chew 1 tablet by mouth every morning.  [provider]  NONFORMULARY OR COMPOUNDED ITEM Transport chair  dx parkinsons, hx falls 04/21/23   Antonio Meth, Jamee SAUNDERS, DO  pantoprazole  (PROTONIX ) 40 MG tablet Take 1 tablet (40 mg total) by mouth daily. 05/21/24   Lowne Chase, Yvonne R, DO  polyethylene glycol (MIRALAX  / GLYCOLAX ) 17 g packet Take 17 g by mouth daily. 10/02/21   Freddi Hamilton, MD  tamsulosin  (FLOMAX ) 0.4 MG CAPS capsule Take 1 capsule (0.4 mg total) by mouth daily after supper. Patient taking differently: Take 0.4 mg by mouth at bedtime. 04/11/23   Antonio Meth Jamee SAUNDERS, DO  tamsulosin  (FLOMAX ) 0.4 MG CAPS capsule Take 1 capsule (0.4  mg total) by mouth at bedtime. 06/02/23     traZODone  (DESYREL ) 150 MG tablet Take 0.5 tablets (75 mg total) by mouth at bedtime. *Crushed* 05/29/24   Plovsky, Elna, MD  triamcinolone  cream (KENALOG ) 0.1 % Apply 1 application topically 2 (two) times daily. 11/15/22   Antonio Meth Jamee SAUNDERS, DO    Allergies: Bee venom, Strawberry extract, Latex, Zetia  [ezetimibe ], Adhesive [tape], and Statins    Review of Systems  Updated Vital Signs There were no vitals taken for this visit.  Physical Exam  (all labs ordered are listed, but only abnormal results are displayed) Labs Reviewed - No data to display  EKG: None  Radiology: No results found.                              Medical Decision Making Amount and/or Complexity of Data Reviewed Labs: ordered. Radiology: ordered.  Risk OTC drugs. Decision regarding hospitalization.   Patient is a 84 year old male who presents today for evaluation concerns for sepsis.  On initial assessment patient was noted to be tachycardic with heart rates in the 110s as well as febrile to 101.9.  Patient is initially hypertensive.  On the bedside assessment patient was noted be resting comfortably without any acute distress.  Tactile fever on my examination.  Patient is alert however confused.  Does respond to simple questioning.  Responds to commands without any obvious focal neurologic deficits.  Given his clinical history, most suspicious for infectious encephalopathy secondary to urinary sepsis.  Will also obtain further laboratory evaluation and imaging studies to rule out other causes of potential sepsis as well as potential intracranial process that may be contributing.  Laboratory evaluation notable for leukocytosis 17.5 and creatinine otherwise at baseline.  Normal interval lactic acid and urinalysis without clear evidence of infection.  CT head without any acute findings, CT chest with evidence of bibasilar opacities.  Patient remained clinically  and hemodynamically stable during time in the emergency department.  Did have improvement with heart rate with fluid resuscitation.  Overall presentation concerning for sepsis of unclear etiology at this point in time.  Patient's urine without clear evidence of infection however this is status post ongoing outpatient Keflex .  His bibasilar so opacities may have represent some degree of pulmonary infection.  Will need admission at this time for further management and monitoring.   Final diagnoses:  Sepsis, due to unspecified organism, unspecified whether acute organ dysfunction present [facility])    ED Discharge Orders     None          Laurita Sieving, MD 06/28/24 2317  "

## 2024-06-28 NOTE — Sepsis Progress Note (Signed)
 Elink monitoring for the code sepsis protocol.

## 2024-06-28 NOTE — ED Notes (Signed)
 Patient arrived to the ED via EMS for fever at home. Patients wife reports that pt was dx with a UTI and has been taking antibiotics at home. Wife reports that patient has been taking antibiotics as prescribed. Patients wife reports that nothing taken at home for fever.

## 2024-06-29 ENCOUNTER — Encounter (HOSPITAL_COMMUNITY): Payer: Self-pay | Admitting: Internal Medicine

## 2024-06-29 ENCOUNTER — Observation Stay (HOSPITAL_COMMUNITY)

## 2024-06-29 DIAGNOSIS — Z515 Encounter for palliative care: Secondary | ICD-10-CM

## 2024-06-29 DIAGNOSIS — J690 Pneumonitis due to inhalation of food and vomit: Secondary | ICD-10-CM | POA: Diagnosis present

## 2024-06-29 DIAGNOSIS — G20A1 Parkinson's disease without dyskinesia, without mention of fluctuations: Secondary | ICD-10-CM

## 2024-06-29 DIAGNOSIS — E785 Hyperlipidemia, unspecified: Secondary | ICD-10-CM | POA: Diagnosis present

## 2024-06-29 DIAGNOSIS — N390 Urinary tract infection, site not specified: Secondary | ICD-10-CM | POA: Diagnosis present

## 2024-06-29 DIAGNOSIS — F0280 Dementia in other diseases classified elsewhere without behavioral disturbance: Secondary | ICD-10-CM | POA: Diagnosis present

## 2024-06-29 DIAGNOSIS — Z66 Do not resuscitate: Secondary | ICD-10-CM | POA: Diagnosis present

## 2024-06-29 DIAGNOSIS — K5909 Other constipation: Secondary | ICD-10-CM | POA: Diagnosis present

## 2024-06-29 DIAGNOSIS — D638 Anemia in other chronic diseases classified elsewhere: Secondary | ICD-10-CM | POA: Diagnosis present

## 2024-06-29 DIAGNOSIS — Z7189 Other specified counseling: Secondary | ICD-10-CM | POA: Diagnosis not present

## 2024-06-29 DIAGNOSIS — I129 Hypertensive chronic kidney disease with stage 1 through stage 4 chronic kidney disease, or unspecified chronic kidney disease: Secondary | ICD-10-CM | POA: Diagnosis present

## 2024-06-29 DIAGNOSIS — Z1152 Encounter for screening for COVID-19: Secondary | ICD-10-CM | POA: Diagnosis not present

## 2024-06-29 DIAGNOSIS — Z7902 Long term (current) use of antithrombotics/antiplatelets: Secondary | ICD-10-CM | POA: Diagnosis not present

## 2024-06-29 DIAGNOSIS — I69354 Hemiplegia and hemiparesis following cerebral infarction affecting left non-dominant side: Secondary | ICD-10-CM | POA: Diagnosis not present

## 2024-06-29 DIAGNOSIS — K219 Gastro-esophageal reflux disease without esophagitis: Secondary | ICD-10-CM | POA: Diagnosis present

## 2024-06-29 DIAGNOSIS — A419 Sepsis, unspecified organism: Secondary | ICD-10-CM | POA: Diagnosis present

## 2024-06-29 DIAGNOSIS — N400 Enlarged prostate without lower urinary tract symptoms: Secondary | ICD-10-CM | POA: Diagnosis present

## 2024-06-29 DIAGNOSIS — E559 Vitamin D deficiency, unspecified: Secondary | ICD-10-CM | POA: Diagnosis present

## 2024-06-29 DIAGNOSIS — I4510 Unspecified right bundle-branch block: Secondary | ICD-10-CM | POA: Diagnosis present

## 2024-06-29 DIAGNOSIS — F0150 Vascular dementia without behavioral disturbance: Secondary | ICD-10-CM | POA: Diagnosis present

## 2024-06-29 DIAGNOSIS — G9341 Metabolic encephalopathy: Secondary | ICD-10-CM

## 2024-06-29 DIAGNOSIS — I2510 Atherosclerotic heart disease of native coronary artery without angina pectoris: Secondary | ICD-10-CM | POA: Diagnosis present

## 2024-06-29 DIAGNOSIS — J189 Pneumonia, unspecified organism: Secondary | ICD-10-CM

## 2024-06-29 DIAGNOSIS — N1832 Chronic kidney disease, stage 3b: Secondary | ICD-10-CM | POA: Diagnosis present

## 2024-06-29 DIAGNOSIS — R1310 Dysphagia, unspecified: Secondary | ICD-10-CM | POA: Diagnosis present

## 2024-06-29 LAB — CBC
HCT: 29.8 % — ABNORMAL LOW (ref 39.0–52.0)
Hemoglobin: 9.8 g/dL — ABNORMAL LOW (ref 13.0–17.0)
MCH: 33 pg (ref 26.0–34.0)
MCHC: 32.9 g/dL (ref 30.0–36.0)
MCV: 100.3 fL — ABNORMAL HIGH (ref 80.0–100.0)
Platelets: 188 K/uL (ref 150–400)
RBC: 2.97 MIL/uL — ABNORMAL LOW (ref 4.22–5.81)
RDW: 13.5 % (ref 11.5–15.5)
WBC: 18.9 K/uL — ABNORMAL HIGH (ref 4.0–10.5)
nRBC: 0 % (ref 0.0–0.2)

## 2024-06-29 LAB — BASIC METABOLIC PANEL WITH GFR
Anion gap: 9 (ref 5–15)
BUN: 34 mg/dL — ABNORMAL HIGH (ref 8–23)
CO2: 25 mmol/L (ref 22–32)
Calcium: 8.5 mg/dL — ABNORMAL LOW (ref 8.9–10.3)
Chloride: 108 mmol/L (ref 98–111)
Creatinine, Ser: 1.63 mg/dL — ABNORMAL HIGH (ref 0.61–1.24)
GFR, Estimated: 42 mL/min — ABNORMAL LOW
Glucose, Bld: 119 mg/dL — ABNORMAL HIGH (ref 70–99)
Potassium: 4.7 mmol/L (ref 3.5–5.1)
Sodium: 141 mmol/L (ref 135–145)

## 2024-06-29 LAB — I-STAT CG4 LACTIC ACID, ED: Lactic Acid, Venous: 1.4 mmol/L (ref 0.5–1.9)

## 2024-06-29 LAB — VITAMIN B12: Vitamin B-12: 953 pg/mL — ABNORMAL HIGH (ref 180–914)

## 2024-06-29 LAB — TROPONIN T, HIGH SENSITIVITY: Troponin T High Sensitivity: 56 ng/L — ABNORMAL HIGH (ref 0–19)

## 2024-06-29 LAB — MRSA NEXT GEN BY PCR, NASAL: MRSA by PCR Next Gen: NOT DETECTED

## 2024-06-29 LAB — TSH: TSH: 0.832 u[IU]/mL (ref 0.350–4.500)

## 2024-06-29 LAB — AMMONIA: Ammonia: 27 umol/L (ref 9–35)

## 2024-06-29 MED ORDER — PANTOPRAZOLE SODIUM 40 MG IV SOLR
40.0000 mg | Freq: Every day | INTRAVENOUS | Status: DC
  Administered 2024-06-29 – 2024-07-02 (×4): 40 mg via INTRAVENOUS
  Filled 2024-06-29 (×4): qty 10

## 2024-06-29 MED ORDER — CARBIDOPA-LEVODOPA 25-100 MG PO TABS
2.0000 | ORAL_TABLET | Freq: Three times a day (TID) | ORAL | Status: DC
  Administered 2024-06-29 – 2024-07-02 (×10): 2 via ORAL
  Filled 2024-06-29 (×10): qty 2

## 2024-06-29 MED ORDER — CARBAMAZEPINE 100 MG/5ML PO SUSP
100.0000 mg | Freq: Every day | ORAL | Status: DC
  Administered 2024-06-29 – 2024-07-02 (×5): 100 mg via ORAL
  Filled 2024-06-29 (×4): qty 5

## 2024-06-29 MED ORDER — TAMSULOSIN HCL 0.4 MG PO CAPS
0.4000 mg | ORAL_CAPSULE | Freq: Every day | ORAL | Status: DC
  Administered 2024-06-29 – 2024-07-01 (×3): 0.4 mg via ORAL
  Filled 2024-06-29 (×3): qty 1

## 2024-06-29 MED ORDER — CARBAMAZEPINE 100 MG/5ML PO SUSP
100.0000 mg | Freq: Every morning | ORAL | Status: DC
  Administered 2024-06-29 – 2024-07-02 (×4): 100 mg via ORAL
  Filled 2024-06-29 (×4): qty 5

## 2024-06-29 MED ORDER — ACETAMINOPHEN 650 MG RE SUPP: 650.0000 mg | Freq: Four times a day (QID) | RECTAL | Status: DC | PRN

## 2024-06-29 MED ORDER — LORAZEPAM 0.5 MG PO TABS
0.2500 mg | ORAL_TABLET | Freq: Every morning | ORAL | Status: DC
  Administered 2024-06-29 – 2024-07-02 (×4): 0.25 mg via ORAL
  Filled 2024-06-29 (×4): qty 1

## 2024-06-29 MED ORDER — AMLODIPINE BESYLATE 10 MG PO TABS
10.0000 mg | ORAL_TABLET | Freq: Every morning | ORAL | Status: DC
  Administered 2024-06-29 – 2024-07-02 (×4): 10 mg via ORAL
  Filled 2024-06-29 (×2): qty 1
  Filled 2024-06-29: qty 2
  Filled 2024-06-29: qty 1

## 2024-06-29 MED ORDER — LORAZEPAM 1 MG PO TABS
1.0000 mg | ORAL_TABLET | Freq: Every day | ORAL | Status: DC
  Administered 2024-06-29 – 2024-07-01 (×3): 1 mg via ORAL
  Filled 2024-06-29 (×4): qty 1

## 2024-06-29 MED ORDER — ACETAMINOPHEN 325 MG PO TABS
650.0000 mg | ORAL_TABLET | Freq: Four times a day (QID) | ORAL | Status: DC | PRN
  Administered 2024-06-30 – 2024-07-01 (×2): 650 mg via ORAL
  Filled 2024-06-29 (×2): qty 2

## 2024-06-29 MED ORDER — HYDRALAZINE HCL 25 MG PO TABS
25.0000 mg | ORAL_TABLET | Freq: Three times a day (TID) | ORAL | Status: DC
  Administered 2024-06-29 – 2024-07-02 (×10): 25 mg via ORAL
  Filled 2024-06-29 (×10): qty 1

## 2024-06-29 MED ORDER — PANTOPRAZOLE SODIUM 40 MG PO TBEC: 40.0000 mg | DELAYED_RELEASE_TABLET | Freq: Every day | ORAL | Status: DC

## 2024-06-29 MED ORDER — CARBAMAZEPINE 100 MG/5ML PO SUSP
200.0000 mg | Freq: Every day | ORAL | Status: DC
  Administered 2024-06-29 – 2024-07-01 (×3): 200 mg via ORAL
  Filled 2024-06-29 (×4): qty 10

## 2024-06-29 MED ORDER — ENOXAPARIN SODIUM 30 MG/0.3ML IJ SOSY
30.0000 mg | PREFILLED_SYRINGE | INTRAMUSCULAR | Status: DC
  Administered 2024-06-29 – 2024-07-02 (×4): 30 mg via SUBCUTANEOUS
  Filled 2024-06-29 (×4): qty 0.3

## 2024-06-29 MED ORDER — POLYETHYLENE GLYCOL 3350 17 G PO PACK
17.0000 g | PACK | Freq: Every day | ORAL | Status: DC
  Administered 2024-07-01: 17 g via ORAL
  Filled 2024-06-29: qty 1

## 2024-06-29 MED ORDER — CLOPIDOGREL BISULFATE 75 MG PO TABS
75.0000 mg | ORAL_TABLET | Freq: Every day | ORAL | Status: DC
  Administered 2024-06-29 – 2024-07-02 (×4): 75 mg via ORAL
  Filled 2024-06-29 (×4): qty 1

## 2024-06-29 MED ORDER — LORAZEPAM 0.5 MG PO TABS: 0.2500 mg | ORAL_TABLET | ORAL | Status: DC

## 2024-06-29 MED ORDER — LORAZEPAM 1 MG PO TABS
1.0000 mg | ORAL_TABLET | Freq: Every day | ORAL | Status: DC
  Administered 2024-06-29 – 2024-07-02 (×4): 1 mg via ORAL
  Filled 2024-06-29 (×4): qty 1

## 2024-06-29 MED ORDER — TRAZODONE HCL 50 MG PO TABS
75.0000 mg | ORAL_TABLET | Freq: Every day | ORAL | Status: DC
  Administered 2024-06-29 – 2024-07-01 (×3): 75 mg via ORAL
  Filled 2024-06-29 (×3): qty 2

## 2024-06-29 MED ORDER — SODIUM CHLORIDE 0.9 % IV SOLN
3.0000 g | Freq: Two times a day (BID) | INTRAVENOUS | Status: DC
  Administered 2024-06-29 – 2024-07-02 (×7): 3 g via INTRAVENOUS
  Filled 2024-06-29 (×7): qty 8

## 2024-06-29 NOTE — ED Notes (Signed)
 Wife at bedside.

## 2024-06-29 NOTE — Consult Note (Signed)
 "                                                  Palliative Care Consult Note                                  Date: 06/29/2024   Patient Name: William [name]  DOB: 10-22-1940  MRN: [id number]  Age / Sex: 84 y.o., male  PCP: [name], DO Referring Physician: Rosario Leatrice FERNS, MD  Reason for Consultation: Establishing goals of care  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   HTN (hypertension) 07/19/2015   Hyperlipidemia    Kidney disease, chronic, stage III (GFR 30-59 ml/min) (HCC)    Major neurocognitive disorder 01/09/2014   Mild, related to stroke history   Nocturia    Parkinson disease (HCC)    Renal insufficiency 06/25/2013   S/P carotid endarterectomy    BILATERAL ICA--  PATENT PER DUPLEX  05-19-2012   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  Before meeting with the patient/family, I spent time reviewing the chart notes including ED provider notes from yesterday, admission H&P from today, nursing notes from today, SLP evaluation and procedure from today.. I also reviewed vital signs, nursing flowsheets, medication administrations record, labs, and imaging. Labs reviewed include respiratory panel (negative) from yesterday, blood cultures from yesterday which are still  pending, urinalysis from yesterday which is essentially normal, CMP from yesterday with creatinine within baseline over the past 9 months and 1.75, CBC from yesterday with a bump in white count to 17.5 and repeated today with continued increase in 18.9, normal lactic acid x 2; all of this in the presence of likely aspiration pneumonia and sepsis.  Legionella and strep pneumoniae as well as MRSA by PCR and sputum culture are all pending.  Essentially the patient is here for likely aspiration pneumonia resulting in sepsis, etiology likely advancing Parkinson's and severe dysphagia.  I met with the patient at bedside, his wife was present.   We meet to discuss diagnosis prognosis, GOC, EOL wishes, disposition and options. Concept of Palliative Care was introduced as specialized medical care for people and their families living with serious illness.  If focuses on providing relief from the symptoms and stress of a serious illness.  The goal is to improve quality of life for both the patient and the family. Values and goals of care important to patient and family were attempted to be elicited.  Created space and opportunity  for patient  and family to explore thoughts and feelings regarding current medical situation   Natural trajectory and current clinical status were discussed. Questions and concerns addressed. Patient  encouraged to call with questions or concerns.    Patient/Family Understanding of Illness: She understands he has possible aspiration for a while but not recognized.  We did talk about the idea of silent aspiration.  She notes that this is his first infection from it.  His lunch was all pured but he ate it all and she was surprised by this.  She notes that he was diagnosed with Parkinson's a few years ago and it was first noticed by the physical therapist.  In the past few years he has had changes in his walking, progressive weakness, recently started drooling substantially in the past couple  weeks.  We talked about this is likely a sign of weakening facial and laryngeal muscles and this weakness is likely what contributed to the aspiration event.  We spent significant amount time talking about pathophysiology and prognosis of Parkinson's, likelihood of ongoing aspiration even with precautions.  Life Review: The patient and his wife have been married for 9 years, but worked together for 10 years prior to that.  They were both previously married and divorced.  They both have children from their previous marriage and the patient has a son and daughter.  They have no children together.  He also has 2 grandchildren, ages 37 and 58.  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  He loves the [employer] and is a big fan of the [employer].  Previously they enjoyed golfing, but he is unable to at this point.  He is Catholic but does not attend church frequently.  I offered chaplain support and they have declined at this time but understand is available during her hospital stay.  Baseline Status: At baseline he shuffles and walks with a gait belt, has had mild worsening in dementia which is now more profound with the sepsis and encephalopathy.  He lives at home with his wife, goes to [facility] Monday, Wednesday, Friday.  He does not talk much and does have some dementia like behavior such as taking his dad lives with them.  Today's Discussion: In addition to discussions described above we had extensive discussion of various topics.  In addition to discussing the pathophysiology and prognosis related to advanced Parkinson's disease with severe dementia, sepsis due to aspiration pneumonia.  We also talked about the likelihood that he will have future aspiration and pneumonia episodes even with swallowing precautions.  We talked about the possibility of aspirating his own secretions and saliva.  We spent a lot of time talking about diet and pured diet which  was recommended by SLP.  We talked about different options for pured diet and consistencies.  I reiterated SLP recommendations for very thick liquids (pudding consistency).  We spent time talking about his wife [name]'s familiarity with palliative care and hospice.  They about other family members [facility] and the patient previously was on palliative care.  Previously they were evaluated by hospice but they did not think that he met criteria at that point.  We also talked about the patient's parkinsonian dementia and encephalopathy and his wife agrees that he is not in a position to be able to make his own healthcare decisions at this time, which I concur with.  We spent time talk about possibilities moving  forward.  I confirmed CODE STATUS which is DNR/DNI.  I also confirmed that the patient would not want a feeding tube and I discussed that feeding tube would not be recommended in this point regardless because it is unable to help his current situation.  After discussion about options moving forward the goal at this time seems to be continued medical treatment to see if the patient can improve enough to discharge from the hospital.  Goal would be for him to discharge home.  Patient's wife Devere seems to be open to discussing with hospice at some point, but is not sure as of yet.  I recommended at a minimum outpatient palliative care for ongoing GOC conversations outside of the hospital.  Will allow some time for outcomes and ongoing conversations.  If they get to a point where they would like to have a noncommittal discussion with hospice then we can help arrange that.  I think his clinical course over the next 2 to 3 days will help clarify their goals.  Goals: DNR/DNI, no feeding tube, time for outcomes, ongoing GOC conversations pending his clinical trajectory in the coming days.  Review of Systems  Respiratory:  Negative for shortness of breath.   Cardiovascular:  Negative for chest pain.   Gastrointestinal:  Negative for abdominal pain, nausea and vomiting.    Objective:   Primary Diagnoses: Present on Admission:  Pneumonia  Acute metabolic encephalopathy  Parkinson's disease (HCC)  CAP (community acquired pneumonia)   Vital Signs:  BP (!) 169/58   Pulse 71   Temp 98.9 F (37.2 C) (Oral)   Resp 20   Ht 5' 10 (1.778 m)   Wt 62.5 kg   SpO2 100%   BMI 19.77 kg/m   Physical Exam Vitals and nursing note reviewed.  Constitutional:      General: He is not in acute distress.    Appearance: He is ill-appearing.     Comments: Mostly asleep but does wake up intermittently and participate in conversation; appears frail and weak  HENT:     Head: Normocephalic and atraumatic.  Cardiovascular:     Rate and Rhythm: Normal rate.  Pulmonary:     Effort: Pulmonary effort is normal. No respiratory distress.  Abdominal:     General: Abdomen is flat.  Skin:    General: Skin is warm and dry.  Psychiatric:        Mood and Affect: Mood normal.        Behavior: Behavior normal.     Palliative Assessment/Data: 40%   Assessment & Plan:   HPI/Patient Profile: 84 y.o. 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   Palliative medicine was consulted for GOC conversations.  SUMMARY OF RECOMMENDATIONS   DNR decimated (DNR/DNI) No feeding tube Continue full scope of care otherwise Time for outcomes to see how he does in the coming days Ongoing GOC conversations Palliative medicine will continue to follow  Symptom Management:  Per  primary team Palliative medicine is available to assist as needed  Code Status: DNR - Limited (DNR/DNI)  Prognosis:  Unable to determine  Discharge Planning:  To  Be Determined   Discussed with: Patient, family, medical team, nursing team    Thank you for allowing us  to participate in the care of MICKEY ESGUERRA PMT will continue to support holistically.  Time Total: 80 min  Detailed review of medical records (labs, imaging, vital signs), medically appropriate exam, discussed with treatment team, counseling and education to patient, family, & staff, documenting clinical information, medication management, coordination of care  Signed by: Camellia Kays, NP Palliative Medicine Team  Team Phone # [phone] (Nights/Weekends)  06/29/2024, 4:39 PM  "

## 2024-06-29 NOTE — H&P (Signed)
 " History and Physical    William [name] FMW:[id number] DOB: 06/06/1941 DOA: 06/28/2024  PCP: [name] William [name], DO  Patient coming from: Home  Chief Complaint: Fever, altered mental status  HPI: William [name] is a 84 y.o. 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  Vital signs on arrival to the ED: Temperature 101.9 F, pulse 114, respiratory rate 23, blood pressure 173/62, and SpO2 100% on room air.  Labs notable for WBC count 17.5, hemoglobin 11.2 (stable), glucose 136, creatinine 1.75 (stable), COVID/influenza/RSV PCR negative, blood cultures in process, UA not suggestive of infection, lactic acid normal x 2.  Chest x-ray showing new patchy airspace opacity at the lung bases.  CT head showing no acute intracranial abnormality.  Patient was given Tylenol , cefepime , and 1.5 L IV fluids.  TRH called to admit.  Patient is slightly confused and history provided mostly by his wife at bedside who states patient was diagnosed with UTI a week ago and started on antibiotics.  Tonight he started having chills and his breathing was funny.  Also patient had complained of chest pain so wife called EMS.  He has been coughing.  He goes to daycare 3 times a week.  No abdominal pain, vomiting, or diarrhea reported.  Patient denies any chest pain or shortness of breath at this time.  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  Review of Systems:  Review of Systems  All other systems reviewed and are negative.   Past Medical History:  Diagnosis Date   Arthritis    low back   Basal cell carcinoma of face 12/26/2014   Mohs surgery jan 2016    Bladder stone    BPH (benign prostatic hyperplasia) 08/06/2007    Chronic kidney disease 2014   Stage III   Closed fracture of fifth metacarpal bone 05/15/2015   Eczema    Fasting hyperglycemia 12/21/2006   GERD (gastroesophageal reflux disease)    History of right MCA infarct 06/14/2004   HTN (hypertension) 07/19/2015   Hyperlipidemia    Kidney disease, chronic, stage III (GFR 30-59 ml/min) (HCC)    Major neurocognitive disorder 01/09/2014   Mild, related to stroke history   Nocturia    Parkinson disease (HCC)    Renal insufficiency 06/25/2013   S/P carotid endarterectomy    BILATERAL ICA--  PATENT PER DUPLEX  05-19-2012   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/26/2017   Right calf   Urinary frequency    Vitamin D  deficiency     Past Surgical History:  Procedure Laterality Date   APPENDECTOMY  AS CHILD   CARDIOVASCULAR STRESS TEST  03-27-2012  DR CRENSHAW   LOW RISK LEXISCAN  STUDY-- PROBABLE NORMAL PERFUSION AND SOFT TISSUE ATTENUATION/  NO ISCHEMIA/ EF 51%   CAROTID ENDARTERECTOMY Bilateral LEFT  11-12-2008  DR GREG HAYES   RIGHT ICA  2006  ([facility])   CHOLECYSTECTOMY N/A 02/23/2022   Procedure: LAPAROSCOPIC CHOLECYSTECTOMY;  Surgeon: [name], MD;  Location: [facility] OR;  Service: General;  Laterality: N/A;   CYSTOSCOPY W/ RETROGRADES Bilateral 06/22/2021   Procedure: CYSTOSCOPY WITH RETROGRADE PYELOGRAM;  Surgeon: [name], MD;  Location: [facility];  Service: Urology;  Laterality: Bilateral;   CYSTOSCOPY WITH  LITHOLAPAXY N/A 02/26/2013   Procedure: CYSTOSCOPY WITH LITHOLAPAXY;  Surgeon: Garnette Shack, MD;  Location: [facility];  Service: Urology;  Laterality: N/A;   ENDOSCOPIC RETROGRADE CHOLANGIOPANCREATOGRAPHY (ERCP) WITH PROPOFOL  N/A 02/22/2022   Procedure: ENDOSCOPIC RETROGRADE CHOLANGIOPANCREATOGRAPHY (ERCP) WITH PROPOFOL ;  Surgeon: [name], MD;  Location: [facility] ENDOSCOPY;  Service: Gastroenterology;  Laterality: N/A;   EYE SURGERY  Jan. 2016   cataract  surgery both eyes   INGUINAL HERNIA REPAIR Right 11-08-2006   IR KYPHO EA ADDL LEVEL THORACIC OR LUMBAR  02/12/2021   IR RADIOLOGIST EVAL & MGMT  02/18/2021   MASS EXCISION N/A 03/03/2016   Procedure: EXCISION OF BACK  MASS;  Surgeon: [name], MD;  Location: [facility];  Service: General;  Laterality: N/A;   MOHS SURGERY Left 1/ 2016   [name]-- Basal cell   PROSTATE SURGERY     REMOVAL OF STONES  02/22/2022   Procedure: REMOVAL OF STONES;  Surgeon: [name], MD;  Location: [facility] ENDOSCOPY;  Service: Gastroenterology;;   [name]  02/22/2022   Procedure: [name];  Surgeon: [name], MD;  Location: [facility] ENDOSCOPY;  Service: Gastroenterology;;   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 N/A 06/22/2021   Procedure: TRANSURETHRAL RESECTION OF BLADDER TUMOR;  Surgeon: Shack Garnette, MD;  Location: [facility];  Service: Urology;  Laterality: N/A;   TRANSURETHRAL RESECTION OF PROSTATE N/A 02/26/2013   Procedure: TRANSURETHRAL RESECTION OF THE PROSTATE WITH GYRUS INSTRUMENTS;  Surgeon: Garnette Shack, MD;  Location: [facility];  Service: Urology;  Laterality: N/A;   TRANSURETHRAL RESECTION OF PROSTATE N/A 06/22/2021   Procedure: TRANSURETHRAL RESECTION OF THE PROSTATE (TURP);  Surgeon: Shack Garnette, MD;  Location: [facility];  Service: Urology;  Laterality: N/A;     reports that he quit smoking about 19 years ago. His smoking use included cigarettes. He started smoking about 59 years ago. He has a 80 pack-year smoking history. He has never used smokeless tobacco. He reports that he does not currently use alcohol. He reports that he does not use drugs.  Allergies[1]  Family History  Problem Relation Age of Onset   Heart disease Mother        CHF   Bipolar disorder Mother    Heart disease Father        CHF    Prior to Admission medications  Medication Sig Start Date End Date Taking?  Authorizing Provider  acetaminophen  (TYLENOL ) 325 MG tablet Take 2 tablets (650 mg total) by mouth every 6 (six) hours as needed for mild pain. 02/24/22  Yes Rosalba Glendale DEL, PA-C  amLODipine  (NORVASC ) 10 MG tablet Take 1 tablet (10 mg total) by mouth every morning. 12/29/23  Yes [name] Rockers R, DO  amoxicillin  (AMOXIL ) 500 MG capsule Take 500 mg by mouth 3 (three) times daily. 06/24/24  Yes [provider]  carBAMazepine  (TEGRETOL ) 100 MG/5ML suspension Take 5 mLs (100 mg total) by mouth in the morning AND 5 mLs (100 mg total) daily at 2 PM AND 10 mLs (200 mg total) at bedtime. 04/10/24  Yes Plovsky, Elna, MD  carbidopa -levodopa  (SINEMET  IR) 25-100 MG tablet Take 2 tablets by mouth 3 (three) times daily. 06/13/24  Yes Jaffe, Adam R, DO  Cholecalciferol  (VITAMIN D3) 50 MCG (2000 UT) CHEW Chew 1 tablet by mouth daily.   Yes [provider]  clobetasol  cream (TEMOVATE ) 0.05 % Apply 1 application to affected areas topically up to 2 (two) times daily as  needed. Do not apply to face, groin, or under arms. 06/21/24  Yes [name] Rockers R, DO  clopidogrel  (PLAVIX ) 75 MG tablet Take 1 tablet (75 mg total) by mouth daily. 03/02/24  Yes [name] Rockers [name], DO  cyanocobalamin  (VITAMIN B12) 1000 MCG tablet Take 1 tablet (1,000 mcg total) by mouth daily. 03/02/24  Yes [name] Rockers R, DO  ferrous sulfate  325 (65 FE) MG EC tablet Take 2 tablets by mouth at bedtime. *Crushed*   Yes [provider]  hydrALAZINE  (APRESOLINE ) 25 MG tablet Take 1 tablet (25 mg total) by mouth 3 (three) times daily. 02/20/24  Yes [name] Rockers R, DO  LORazepam  (ATIVAN ) 1 MG tablet Take 1 tablet (1 mg total) by mouth 3 (three) times daily. Take in the morning, at 3pm, and at bedtime Patient taking differently: Take 0.25-1 mg by mouth See admin instructions. Take 0.25mg  by mouth in the morning, 1 mg in the afternoon and at night 05/29/24  Yes Plovsky, Elna, MD  Multiple Vitamins-Minerals  (ADULT ONE DAILY GUMMIES) CHEW Chew 1 tablet by mouth every morning.   Yes [provider]  pantoprazole  (PROTONIX ) 40 MG tablet Take 1 tablet (40 mg total) by mouth daily. 05/21/24  Yes [name] Rockers R, DO  polyethylene glycol (MIRALAX  / GLYCOLAX ) 17 g packet Take 17 g by mouth daily. 10/02/21  Yes Freddi Hamilton, MD  tamsulosin  (FLOMAX ) 0.4 MG CAPS capsule Take 1 capsule (0.4 mg total) by mouth daily after supper. Patient taking differently: Take 0.4 mg by mouth at bedtime. 04/11/23  Yes [name] Rockers R, DO  tamsulosin  (FLOMAX ) 0.4 MG CAPS capsule Take 1 capsule (0.4 mg total) by mouth at bedtime. 06/02/23  Yes   traZODone  (DESYREL ) 150 MG tablet Take 0.5 tablets (75 mg total) by mouth at bedtime. *Crushed* 05/29/24  Yes Plovsky, Elna, MD  bacitracin  ointment Apply 1 Application topically 2 (two) times daily. Patient not taking: Reported on 06/29/2024 04/24/23   Christopher Savannah, PA-C  EPINEPHrine  (EPIPEN  2-PAK) 0.3 mg/0.3 mL IJ SOAJ injection Inject 0.3 mg into the muscle as needed for anaphylaxis. Patient not taking: Reported on 06/29/2024 12/20/23   [name] Rockers [name], DO  fluticasone  (CUTIVATE ) 0.05 % cream 1 application  at bedtime as needed (psoriasis). Patient not taking: Reported on 06/29/2024 12/16/16   [provider]  glycerin  adult 2 g suppository Place 1 suppository rectally as needed for constipation. Patient not taking: Reported on 06/29/2024 03/10/24   Christopher Savannah, PA-C  NONFORMULARY OR COMPOUNDED ITEM Transport chair  dx parkinsons, hx falls 04/21/23   [name], Yvonne R, DO  triamcinolone  cream (KENALOG ) 0.1 % Apply 1 application topically 2 (two) times daily. Patient not taking: Reported on 06/29/2024 11/15/22   [name] Rockers [name], DO    Physical Exam: Vitals:   06/29/24 0030 06/29/24 0045 06/29/24 0100 06/29/24 0215  BP: (!) 142/54 (!) 115/99 (!) 152/65   Pulse: 89 90 87 84  Resp: (!) 37 (!) 32 (!) 39 (!) 31  Temp:      TempSrc:      SpO2: 96%  97% 97% 95%  Weight:      Height:        Physical Exam Vitals reviewed.  Constitutional:      General: He is not in acute distress. HENT:     Head: Normocephalic and atraumatic.  Eyes:     Extraocular Movements: Extraocular movements intact.  Cardiovascular:     Rate and Rhythm: Normal rate  and regular rhythm.     Pulses: Normal pulses.  Pulmonary:     Effort: Pulmonary effort is normal. No respiratory distress.     Breath sounds: Normal breath sounds.  Abdominal:     General: Bowel sounds are normal.     Palpations: Abdomen is soft.     Tenderness: There is no abdominal tenderness. There is no guarding.  Musculoskeletal:     Cervical back: Normal range of motion.     Right lower leg: No edema.     Left lower leg: No edema.  Skin:    General: Skin is warm and dry.  Neurological:     Mental Status: He is alert and oriented to person, place, and time. Mental status is at baseline.     Cranial Nerves: No cranial nerve deficit.     Sensory: No sensory deficit.     Motor: Weakness present.     Comments: Left upper extremity weakness, chronic per wife     Labs on Admission: I have personally reviewed following labs and imaging studies  CBC: Recent Labs  Lab 06/28/24 2118  WBC 17.5*  NEUTROABS 16.0*  HGB 11.2*  HCT 33.8*  MCV 100.0  PLT 217   Basic Metabolic Panel: Recent Labs  Lab 06/28/24 2118  NA 142  K 4.6  CL 106  CO2 24  GLUCOSE 136*  BUN 35*  CREATININE 1.75*  CALCIUM  8.6*   GFR: Estimated Creatinine Clearance: 29.3 mL/min (A) (by C-G formula based on SCr of 1.75 mg/dL (H)). Liver Function Tests: Recent Labs  Lab 06/28/24 2118  AST 18  ALT <5  ALKPHOS 112  BILITOT 0.2  PROT 6.8  ALBUMIN 3.6   No results for input(s): LIPASE, AMYLASE in the last 168 hours. No results for input(s): AMMONIA in the last 168 hours. Coagulation Profile: Recent Labs  Lab 06/28/24 2118  INR 1.0   Cardiac Enzymes: No results for input(s): CKTOTAL,  CKMB, CKMBINDEX, TROPONINI in the last 168 hours. BNP (last 3 results) No results for input(s): PROBNP in the last 8760 hours. HbA1C: No results for input(s): HGBA1C in the last 72 hours. CBG: No results for input(s): GLUCAP in the last 168 hours. Lipid Profile: No results for input(s): CHOL, HDL, LDLCALC, TRIG, CHOLHDL, LDLDIRECT in the last 72 hours. Thyroid  Function Tests: No results for input(s): TSH, T4TOTAL, FREET4, T3FREE, THYROIDAB in the last 72 hours. Anemia Panel: No results for input(s): VITAMINB12, FOLATE, FERRITIN, TIBC, IRON, RETICCTPCT in the last 72 hours. Urine analysis:    Component Value Date/Time   COLORURINE YELLOW 06/28/2024 2110   APPEARANCEUR CLEAR 06/28/2024 2110   APPEARANCEUR Clear 08/27/2021 0740   LABSPEC 1.020 06/28/2024 2110   PHURINE 6.0 06/28/2024 2110   GLUCOSEU NEGATIVE 06/28/2024 2110   HGBUR MODERATE (A) 06/28/2024 2110   HGBUR negative 09/25/2009 1535   BILIRUBINUR NEGATIVE 06/28/2024 2110   BILIRUBINUR negative 06/16/2023 1153   BILIRUBINUR Negative 08/27/2021 0740   KETONESUR NEGATIVE 06/28/2024 2110   PROTEINUR 100 (A) 06/28/2024 2110   UROBILINOGEN 0.2 06/16/2023 1153   UROBILINOGEN 0.2 09/25/2009 1535   NITRITE NEGATIVE 06/28/2024 2110   LEUKOCYTESUR NEGATIVE 06/28/2024 2110    Radiological Exams on Admission: CT Head Wo Contrast Result Date: 06/28/2024 EXAM: CT HEAD WITHOUT CONTRAST 06/28/2024 09:27:00 PM TECHNIQUE: CT of the head was performed without the administration of intravenous contrast. Automated exposure control, iterative reconstruction, and/or weight based adjustment of the mA/kV was utilized to reduce the radiation dose to as low as reasonably  achievable. COMPARISON: 06/20/2024 CLINICAL HISTORY: Mental status change, unknown cause. FINDINGS: BRAIN AND VENTRICLES: Parenchymal volume loss is commensurate with the patient's age. Periventricular white matter changes are present  likely reflecting the sequela of small vessel ischemia. Extensive cortical encephalomalacia involving the right frontotemporal and parietal cortices, in keeping with remote right MCA and ACA territory infarcts, is unchanged from prior examination. Ventricular size is mildly enlarged, commensurate with the degree of parenchymal atrophy and likely reflecting ex vacuo dilation. No acute hemorrhage. No evidence of acute infarct. No extra-axial collection. No mass effect or midline shift. ORBITS: No acute abnormality. SINUSES: Mastoid air cells and middle ear cavities are clear. SOFT TISSUES AND SKULL: No acute soft tissue abnormality. No skull fracture. IMPRESSION: 1. No acute intracranial abnormality. 2. Extensive right frontotemporal and parietal cortical encephalomalacia compatible with remote right MCA and ACA territory infarcts, unchanged. 3. Age-commensurate senescent changes. Electronically signed by: Dorethia Molt MD 06/28/2024 09:38 PM EST RP Workstation: [id number]   DG Chest Port 1 View Result Date: 06/28/2024 EXAM: 1 VIEW XRAY OF THE CHEST 06/28/2024 09:05:00 PM COMPARISON: 06/20/2024 CLINICAL HISTORY: Questionable sepsis - evaluate for abnormality FINDINGS: LUNGS AND PLEURA: New patchy airspace opacity at the lung bases. No pleural effusion. No pneumothorax. HEART AND MEDIASTINUM: No acute abnormality of the cardiac and mediastinal silhouettes. BONES AND SOFT TISSUES: No acute osseous abnormality. IMPRESSION: 1. New patchy airspace opacity at the lung bases. Electronically signed by: Morgane Naveau MD 06/28/2024 09:14 PM EST RP Workstation: [id number]    EKG: Independently reviewed.  Sinus tachycardia, LAFB, RBBB.  No acute ischemic changes.  Assessment and Plan  Sepsis secondary to pneumonia Presenting with fever, chills, tachycardia, tachypnea, and leukocytosis. Chest x-ray showing new patchy airspace opacity at the lung bases.  ?Aspiration pneumonia given history of Parkinson's disease and  dysphagia.  COVID/influenza/RSV PCR negative.  Not hypoxic.  Patient was given 1.5 L IV fluids in the ED.  Lactate normal x 2 and no hypotension.  Continue cefepime .  MRSA PCR screen.  Sputum Gram stain and culture, strep pneumo/Legionella urinary antigens.  Follow-up blood cultures.  Trend WBC count.  Keep n.p.o. at this time, SLP eval, aspiration precautions.  Acute metabolic encephalopathy Likely secondary to pneumonia/sepsis.  UA not suggestive of infection.  CT head showing no acute intracranial abnormality.  Check TSH, B12, and ammonia level.  Chest pain EKG without acute ischemic changes.  Not having active chest pain at this time.  Check troponin.  Hypertension Blood pressure stable.  Continue amlodipine  and hydralazine .  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  History of CVA Continue clopidogrel   BPH Continue Flomax .  CKD stage IIIb Creatinine stable.  Chronic anemia Hemoglobin stable.  GERD Continue pantoprazole .  Chronic constipation Continue MiraLAX .  DVT prophylaxis: Lovenox  Code Status: DNR/DNI (discussed with the patient's wife) Family Communication: Wife at bedside. Level of care: Telemetry bed Admission status: It is my clinical opinion that referral for OBSERVATION is reasonable and necessary in this patient based on the above information provided. The aforementioned taken together are felt to place the patient at high risk for further clinical deterioration. However, it is anticipated that the patient may be medically stable for discharge from the hospital within 24 to 48 hours.  Editha Ram MD [facility]  If 7PM-7AM, please contact night-coverage [email or web address]  06/29/2024, 2:19 AM       [1]  Allergies Allergen Reactions   Bee Venom Anaphylaxis   Strawberry Extract Hives   Latex Itching  Zetia  [Ezetimibe ] Other (See Comments)    myalgia    Adhesive [Tape] Other (See Comments)     blisters   Statins Other (See Comments)    myalgias   "

## 2024-06-29 NOTE — ED Notes (Signed)
 Patient wet new brief and patient cleaned.

## 2024-06-29 NOTE — Plan of Care (Signed)

## 2024-06-29 NOTE — ED Notes (Signed)
 Patients medication lorazepam  was being crushed in a pill crusher when the pill crusher broke and the powder went everywhere on the floor. Another pill from the pyxis was obtained as well as a new pill crusher.

## 2024-06-29 NOTE — Progress Notes (Signed)
 Brief progress note: - Patient was admitted earlier today. -Patient was seen alongside patient's wife. -Discussed extensively with patient's wife.  Questions answered. -Palliative team consulted to define goals of care, particularly, regarding ongoing aspiration and need for nutrition. - According to the patient's wife, patient has made it clear that he does not want NG tube or PEG tube.  As per H&P done earlier today: William [name] is a 84 y.o. 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  Vital signs on arrival to the ED: Temperature 101.9 F, pulse 114, respiratory rate 23, blood pressure 173/62, and SpO2 100% on room air.  Labs notable for WBC count 17.5, hemoglobin 11.2 (stable), glucose 136, creatinine 1.75 (stable), COVID/influenza/RSV PCR negative, blood cultures in process, UA not suggestive of infection, lactic acid normal x 2.  Chest x-ray showing new patchy airspace opacity at the lung bases.  CT head showing no acute intracranial abnormality.  Patient was given Tylenol , cefepime , and 1.5 L IV fluids.  TRH called to admit.   Patient is slightly confused and history provided mostly by his wife at bedside who states patient was diagnosed with UTI a week ago and started on antibiotics.  Tonight he started having chills and his breathing was funny.  Also patient had complained of chest pain so wife called EMS.  He has been coughing.  He goes to daycare 3 times a week.  No abdominal pain, vomiting, or diarrhea reported.  Patient denies any chest pain or shortness of breath at this time.  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   06/29/2024: According to the patient's wife, patient looks a lot better.   Will continue antibiotics.  Further management will depend on hospital course and goals of care.

## 2024-06-29 NOTE — ED Notes (Signed)
 Transported to xray for swallow study

## 2024-06-29 NOTE — Evaluation (Signed)
 Clinical/Bedside Swallow Evaluation Patient Details  Name: William [name] MRN: [id number] Date of Birth: 1941/03/13  Today's Date: 06/29/2024 Time: SLP Start Time (ACUTE ONLY): 0820 SLP Stop Time (ACUTE ONLY): 0828 SLP Time Calculation (min) (ACUTE ONLY): 8 min  Past Medical History:  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   HTN (hypertension) 07/19/2015   Hyperlipidemia    Kidney disease, chronic, stage III (GFR 30-59 ml/min) (HCC)    Major neurocognitive disorder 01/09/2014   Mild, related to stroke history   Nocturia    Parkinson disease (HCC)    Renal insufficiency 06/25/2013   S/P carotid endarterectomy    BILATERAL ICA--  PATENT PER DUPLEX  05-19-2012   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   Right calf   Urinary frequency    Vitamin D  deficiency    Past Surgical History:  Past Surgical History:  Procedure Laterality Date   APPENDECTOMY  AS CHILD   CARDIOVASCULAR STRESS TEST  03-27-2012  DR CRENSHAW   LOW RISK LEXISCAN  STUDY-- PROBABLE NORMAL PERFUSION AND SOFT TISSUE ATTENUATION/  NO ISCHEMIA/ EF 51%   CAROTID ENDARTERECTOMY Bilateral LEFT  11-12-2008  DR GREG HAYES   RIGHT ICA  2006  ([facility])   CHOLECYSTECTOMY N/A 02/23/2022   Procedure: LAPAROSCOPIC CHOLECYSTECTOMY;  Surgeon: [name], MD;  Location: [facility] OR;  Service: General;  Laterality: N/A;   CYSTOSCOPY W/ RETROGRADES Bilateral 06/22/2021   Procedure: CYSTOSCOPY WITH RETROGRADE PYELOGRAM;  Surgeon: [name], MD;  Location: [facility];  Service: Urology;   Laterality: Bilateral;   CYSTOSCOPY WITH LITHOLAPAXY N/A 02/26/2013   Procedure: CYSTOSCOPY WITH LITHOLAPAXY;  Surgeon: Senior Matilda, MD;  Location[facility];  Service: Urology;  Laterality: N/A;   ENDOSCOPIC RETROGRADE CHOLANGIOPANCREATOGRAPHY (ERCP) WITH PROPOFOL  N/A 02/22/2022   Procedure: ENDOSCOPIC RETROGRADE CHOLANGIOPANCREATOGRAPHY (ERCP) WITH PROPOFOL ;  Surgeon: [name], MD;  Location: [facility] ENDOSCOPY;  Service: Gastroenterology;  Laterality: N/A;   EYE SURGERY  Jan. 2016   cataract surgery both eyes   INGUINAL HERNIA REPAIR Right 11-08-2006   IR KYPHO EA ADDL LEVEL THORACIC OR LUMBAR  02/12/2021   IR RADIOLOGIST EVAL & MGMT  02/18/2021   MASS EXCISION N/A 03/03/2016   Procedure: EXCISION OF BACK  MASS;  Surgeon: [name], MD;  Location: [facility];  Service: General;  Laterality: N/A;   MOHS SURGERY Left 1/ 2016   [name]-- Basal cell   PROSTATE SURGERY     REMOVAL OF STONES  02/22/2022   Procedure: REMOVAL OF STONES;  Surgeon: [name], MD;  Location: 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 N/A 06/22/2021   Procedure: TRANSURETHRAL RESECTION OF BLADDER TUMOR;  Surgeon: [name], MD;  Location: [facility];  Service: Urology;  Laterality: N/A;   TRANSURETHRAL RESECTION OF PROSTATE N/A 02/26/2013   Procedure: TRANSURETHRAL RESECTION  OF THE PROSTATE WITH GYRUS INSTRUMENTS;  Surgeon: Garnette Shack, MD;  Location: [facility];  Service: Urology;  Laterality: N/A;   TRANSURETHRAL RESECTION OF PROSTATE N/A 06/22/2021   Procedure: TRANSURETHRAL RESECTION OF THE PROSTATE (TURP);  Surgeon: Shack Garnette, MD;  Location: [facility];  Service: Urology;  Laterality: N/A;   HPI:  [name]  is a 84 y.o. male who presented to the ED 1/15 via EMS for evaluation of fever and confusion. 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    Assessment / Plan / Recommendation  Clinical Impression  Pt presents with clinical indicators of pharyngeal dysphagia.  Pt with low vocal intensity.  There was subtle throat clearing and wet vocal quality following thin liquid and puree trials.  WIth regular solid there was soft palate residue which was reduced with following trial of puree and liquid wash.  Chest imaging with findings suggestive of pna.  Pt with hx PD.  Most recent clinical swallow assessment in 2023 with recs for mechanical soft diet with thin liquids. No instrumental in EMR.  Recommend further SLP Visit Diagnosis: Dysphagia, unspecified (R13.10)    Aspiration Risk  Moderate aspiration risk    Diet Recommendation NPO    Medication Administration:  (Priority oral meds may be given with puree)    Other Recommendations Oral Care Recommendations: Oral care QID     Swallow Evaluation Recommendations  TBD   Assistance Recommended at Discharge  N/A  Functional Status Assessment  (TBD)  Frequency and Duration  (TBD)          Prognosis Prognosis for improved oropharyngeal function:  (TBD)      Swallow Study   General Date of Onset: 06/28/24 HPI: William [name] is a 84 y.o. male who presented to the ED 1/15 via EMS for evaluation of fever and confusion. 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 Type of Study: Bedside Swallow Evaluation Previous  Swallow Assessment: BSE 03/09/22 D3/thin Diet Prior to this Study: NPO History of Recent Intubation: No Behavior/Cognition: Alert;Cooperative;Requires cueing Oral Cavity Assessment: Dry Oral Care Completed by SLP: No Oral Cavity - Dentition: Adequate natural dentition Self-Feeding Abilities: Needs assist Patient Positioning: Partially reclined Baseline Vocal Quality: Low vocal intensity Volitional Cough: Weak Volitional Swallow: Unable to elicit    Oral/Motor/Sensory Function Overall Oral Motor/Sensory Function: Mild impairment Facial ROM: Reduced left Facial Symmetry: Abnormal symmetry left Lingual ROM: Within Functional Limits Lingual Symmetry: Within Functional Limits Lingual Strength: Within Functional Limits Velum: Within Functional Limits Mandible: Within Functional Limits   Ice Chips Ice chips: Within functional limits   Thin Liquid Thin Liquid: Impaired Presentation: Straw;Spoon Pharyngeal  Phase Impairments: Wet Vocal Quality;Multiple swallows;Throat Clearing - Delayed    Nectar Thick Nectar Thick Liquid: Not tested   Honey Thick Honey Thick Liquid: Not tested   Puree Puree: Impaired Presentation: Spoon Pharyngeal Phase Impairments: Throat Clearing - Immediate   Solid     Solid: Impaired Oral Phase Functional Implications: Oral residue      Anette FORBES Grippe, MA, CCC-SLP Acute Rehabilitation Services Office: [phone] 06/29/2024,8:41 AM

## 2024-06-29 NOTE — ED Notes (Signed)
 Called and had patient placed on monitor

## 2024-06-29 NOTE — Procedures (Addendum)
 Modified Barium Swallow Study  Patient Details  Name: William [name] MRN: [id number] Date of Birth: 30-Jan-1941  Today's Date: 06/29/2024  Modified Barium Swallow completed.  Full report located under Chart Review in the Imaging Section.  History of Present Illness William [name] is a 84 y.o. male who presented to the ED 1/15 via EMS for evaluation of fever and confusion. 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   Clinical Impression Pt presents with a severe oropharyngeal dysphagia which is suspected to be chronic in nature c/b delayed or at times absent swallow initiation, reduced lingual strength and coordination, decreased base of tongue retraction, reduced hyolaryngeal elevation and excursion, incomplete laryngeal closure, intermittently absent epiglottic inversion, decreased duration and diameter of UES opening, and diminished sensation.  These deficits resulted in silent aspiration of all consistencies of liquid. There was no aspiration of thin liquids by spoon presentation. Suspect presumed cervical osteophytes (see image below) may be contributing to deficits, impacting both UES opening and epiglottic inversion which was largely absent with liquids, but present with heavier puree and solid bolus. Cued cough was weak and ineffective.  Pt unable to follow directions for execution of compensatory strategies. With solid texture, pt did not initiate a swallow. He did not follow verbal cues to swallow.  Circumlaryngeal massage was not beneficial. A following bolus of puree was introduced to trigger swallow reflex. Pill simulation was not completed for safety concerns, but esophageal sweep following solid bolus revealed adequate esophageal clearance.  Pt was not consistently able to  follow directions and is a poor candidate for swallowing therapy.    Will initiate puree diet with pudding thick liquids.   Consider palliative care consult and liberalization of diet for comfort.   Pt may have small amounts of unthickened water  by spoon, after good oral care, in moderation, when pt is fully awake/alert, with upright positioning and 1:1 assistance.  DIGEST Swallow Severity Rating*  Safety: 3  Efficiency: 3  Overall Pharyngeal Swallow Severity: 3 1: mild; 2: moderate; 3: severe; 4: profound  *The Dynamic Imaging Grade of Swallowing Toxicity is standardized for the head and neck cancer population, however, demonstrates promising clinical applications across populations to standardize the clinical rating of pharyngeal swallow safety and severity.   Presumed cervical osteophytes:   Factors that may increase risk of adverse event in presence of aspiration William [name] 2021): Reduced cognitive function;Limited mobility;Frail or deconditioned;Weak cough;Aspiration of thick, dense, and/or acidic materials  Swallow Evaluation Recommendations Recommendations: PO diet PO Diet Recommendation: Dysphagia 1 (Pureed);Extremely thick liquids (Level 4, pudding thick) Liquid Administration via: Spoon Medication Administration: Crushed with puree Supervision: Staff to assist with self-feeding Swallowing strategies  : Slow rate;Small bites/sips Postural changes: Position pt fully upright for meals Oral care recommendations: Oral care before ice chips/water ;Oral care BID (2x/day) Recommended consults: Consider Palliative care Caregiver Recommendations: Avoid jello, ice cream, thin soups, popsicles;Remove water  pitcher      William FORBES Grippe, MA, CCC-SLP Acute Rehabilitation Services Office: [phone] 06/29/2024,9:50 AM

## 2024-06-30 DIAGNOSIS — J690 Pneumonitis due to inhalation of food and vomit: Secondary | ICD-10-CM | POA: Diagnosis not present

## 2024-06-30 LAB — STREP PNEUMONIAE URINARY ANTIGEN: Strep Pneumo Urinary Antigen: NEGATIVE

## 2024-06-30 MED ORDER — ADULT MULTIVITAMIN W/MINERALS CH
1.0000 | ORAL_TABLET | Freq: Every day | ORAL | Status: DC
  Administered 2024-06-30 – 2024-07-02 (×3): 1 via ORAL
  Filled 2024-06-30 (×3): qty 1

## 2024-06-30 NOTE — Progress Notes (Signed)
 " PROGRESS NOTE    ASAD KEEVEN  FMW:[id number] DOB: 1940-09-16 DOA: 06/28/2024 PCP: [name], DO  Outpatient Specialists:    Brief narrative: As per H&P done on presentation: CAYDAN MCTAVISH is a 84 y.o. 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  Vital signs on arrival to the ED: Temperature 101.9 F, pulse 114, respiratory rate 23, blood pressure 173/62, and SpO2 100% on room air.  Labs notable for WBC count 17.5, hemoglobin 11.2 (stable), glucose 136, creatinine 1.75 (stable), COVID/influenza/RSV PCR negative, blood cultures in process, UA not suggestive of infection, lactic acid normal x 2.  Chest x-ray showing new patchy airspace opacity at the lung bases.  CT head showing no acute intracranial abnormality.  Patient was given Tylenol , cefepime , and 1.5 L IV fluids.  TRH called to admit.   Patient is slightly confused and history provided mostly by his wife at bedside who states patient was diagnosed with UTI a week ago and started on antibiotics.  Tonight he started having chills and his breathing was funny.  Also patient had complained of chest pain so wife called EMS.  He has been coughing.  He goes to daycare 3 times a week.  No abdominal pain, vomiting, or diarrhea reported.  Patient denies any chest pain or shortness of breath at this time.  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  06/30/2024: Patient continues to improve slowly.  Palliative care input is appreciated.  Continue antibiotics for now.  Assessment & Plan:   Principal Problem:   Pneumonia Active Problems:   Chest pain   Acute metabolic encephalopathy   Parkinson's disease (HCC)   Sepsis (HCC)   CAP (community  acquired pneumonia)   Sepsis secondary to pneumonia: - Continue antibiotics. -Continue aspiration precautions.  Acute metabolic encephalopathy: - Likely secondary to above. - Improving.  Hypertension: - Continue to monitor closely.  History of CVA: - Continue Plavix .  BPH: - Continue Flomax .  Chronic kidney disease stage IIIb: - Stable.  Chronic anemia: - Stable.  GERD: - Continue pantoprazole .  Dementia: - No behavioral problems.   DVT prophylaxis: Subcutaneous Lovenox  Code Status: DNR/DNI Family Communication: Wife at bedside Disposition Plan:    Consultants:  Palliative care team.  Procedures:  None.  Antimicrobials:  IV Unasyn .   Subjective: No new complaints. Slowly improving.  Objective: Vitals:   06/29/24 2101 06/30/24 0047 06/30/24 0352 06/30/24 0831  BP: (!) 139/49 (!) 135/47 (!) 151/58 (!) 176/65  Pulse: 68 71  67  Resp:    20  Temp: 97.6 F (36.4 C) 98 F (36.7 C) 98.4 F (36.9 C) 97.6 F (36.4 C)  TempSrc:   Oral   SpO2: 98% 97% 99% 99%  Weight:      Height:        Intake/Output Summary (Last 24 hours) at 06/30/2024 1434 Last data filed at 06/30/2024 0834 Gross per 24 hour  Intake 217.91 ml  Output 900 ml  Net -682.09 ml   Filed Weights   06/28/24 2047  Weight: 62.5 kg    Examination:  General exam: Appears calm and comfortable  Respiratory system: Clear to auscultation.  Cardiovascular system: S1 & S2  Gastrointestinal system: Abdomen is soft and nontender. Central nervous system: Awake and aler.  Data Reviewed: I have personally reviewed following labs and imaging studies  CBC: Recent Labs  Lab 06/28/24 2118 06/29/24 0310  WBC 17.5* 18.9*  NEUTROABS 16.0*  --   HGB 11.2* 9.8*  HCT 33.8* 29.8*  MCV 100.0 100.3*  PLT 217 188   Basic Metabolic Panel: Recent Labs  Lab 06/28/24 2118 06/29/24 0310  NA 142 141  K 4.6 4.7  CL 106 108  CO2 24 25  GLUCOSE 136* 119*  BUN 35* 34*  CREATININE 1.75* 1.63*   CALCIUM  8.6* 8.5*   GFR: Estimated Creatinine Clearance: 31.4 mL/min (A) (by C-G formula based on SCr of 1.63 mg/dL (H)). Liver Function Tests: Recent Labs  Lab 06/28/24 2118  AST 18  ALT <5  ALKPHOS 112  BILITOT 0.2  PROT 6.8  ALBUMIN 3.6   No results for input(s): LIPASE, AMYLASE in the last 168 hours. Recent Labs  Lab 06/29/24 0310  AMMONIA 27   Coagulation Profile: Recent Labs  Lab 06/28/24 2118  INR 1.0   Cardiac Enzymes: No results for input(s): CKTOTAL, CKMB, CKMBINDEX, TROPONINI in the last 168 hours. BNP (last 3 results) No results for input(s): PROBNP in the last 8760 hours. HbA1C: No results for input(s): HGBA1C in the last 72 hours. CBG: No results for input(s): GLUCAP in the last 168 hours. Lipid Profile: No results for input(s): CHOL, HDL, LDLCALC, TRIG, CHOLHDL, LDLDIRECT in the last 72 hours. Thyroid  Function Tests: Recent Labs    06/29/24 0310  TSH 0.832   Anemia Panel: Recent Labs    06/29/24 0310  VITAMINB12 953*   Urine analysis:    Component Value Date/Time   COLORURINE YELLOW 06/28/2024 2110   APPEARANCEUR CLEAR 06/28/2024 2110   APPEARANCEUR Clear 08/27/2021 0740   LABSPEC 1.020 06/28/2024 2110   PHURINE 6.0 06/28/2024 2110   GLUCOSEU NEGATIVE 06/28/2024 2110   HGBUR MODERATE (A) 06/28/2024 2110   HGBUR negative 09/25/2009 1535   BILIRUBINUR NEGATIVE 06/28/2024 2110   BILIRUBINUR negative 06/16/2023 1153   BILIRUBINUR Negative 08/27/2021 0740   KETONESUR NEGATIVE 06/28/2024 2110   PROTEINUR 100 (A) 06/28/2024 2110   UROBILINOGEN 0.2 06/16/2023 1153   UROBILINOGEN 0.2 09/25/2009 1535   NITRITE NEGATIVE 06/28/2024 2110   LEUKOCYTESUR NEGATIVE 06/28/2024 2110   Sepsis Labs: @LABRCNTIP (procalcitonin:4,lacticidven:4)  ) Recent Results (from the past 240 hours)  Urine Culture     Status: Abnormal   Collection Time: 06/20/24  8:13 PM   Specimen: Urine, Catheterized  Result Value Ref Range  Status   Specimen Description URINE, CATHETERIZED  Final   Special Requests   Final    NONE Performed at [facility], [address] [place], [place] [address]    Culture 80,000 COLONIES/mL ENTEROCOCCUS FAECALIS (A)  Final   Report Status 06/23/2024 FINAL  Final   Organism ID, Bacteria ENTEROCOCCUS FAECALIS (A)  Final      Susceptibility   Enterococcus faecalis - MIC*    AMPICILLIN  <=2 SENSITIVE Sensitive     NITROFURANTOIN <=16 SENSITIVE Sensitive     VANCOMYCIN 1 SENSITIVE Sensitive     * 80,000 COLONIES/mL ENTEROCOCCUS FAECALIS  Resp panel by RT-PCR (RSV, Flu A&B, Covid) Anterior Nasal Swab     Status: None   Collection Time: 06/28/24  8:45 PM   Specimen: Anterior Nasal Swab  Result Value Ref Range Status   SARS Coronavirus 2 by RT PCR NEGATIVE NEGATIVE Final   Influenza A by PCR NEGATIVE NEGATIVE Final   Influenza B by PCR NEGATIVE NEGATIVE Final  Comment: (NOTE) The Xpert Xpress SARS-CoV-2/FLU/RSV plus assay is intended as an aid in the diagnosis of influenza from Nasopharyngeal swab specimens and should not be used as a sole basis for treatment. Nasal washings and aspirates are unacceptable for Xpert Xpress SARS-CoV-2/FLU/RSV testing.  Fact Sheet for Patients: bloggercourse.com  Fact Sheet for Healthcare Providers: seriousbroker.it  This test is not yet approved or cleared by the [place]  FDA and has been authorized for detection and/or diagnosis of SARS-CoV-2 by FDA under an Emergency Use Authorization (EUA). This EUA will remain in effect (meaning this test can be used) for the duration of the COVID-19 declaration under Section 564(b)(1) of the Act, 21 U.S.C. section 360bbb-3(b)(1), unless the authorization is terminated or revoked.     Resp Syncytial Virus by PCR NEGATIVE NEGATIVE Final    Comment: (NOTE) Fact Sheet for Patients: bloggercourse.com  Fact Sheet for  Healthcare Providers: seriousbroker.it  This test is not yet approved or cleared by the [place]  FDA and has been authorized for detection and/or diagnosis of SARS-CoV-2 by FDA under an Emergency Use Authorization (EUA). This EUA will remain in effect (meaning this test can be used) for the duration of the COVID-19 declaration under Section 564(b)(1) of the Act, 21 U.S.C. section 360bbb-3(b)(1), unless the authorization is terminated or revoked.  Performed at [facility], [address], [place], [place] [address]   Blood Culture (routine x 2)     Status: None (Preliminary result)   Collection Time: 06/28/24  8:45 PM   Specimen: BLOOD  Result Value Ref Range Status   Specimen Description BLOOD SITE NOT SPECIFIED  Final   Special Requests   Final    BOTTLES DRAWN AEROBIC AND ANAEROBIC Blood Culture results may not be optimal due to an inadequate volume of blood received in culture bottles   Culture   Final    NO GROWTH 2 DAYS Performed at [facility], [address] [place], [place] [address]    Report Status PENDING  Incomplete  Blood Culture (routine x 2)     Status: None (Preliminary result)   Collection Time: 06/28/24  8:50 PM   Specimen: BLOOD  Result Value Ref Range Status   Specimen Description BLOOD SITE NOT SPECIFIED  Final   Special Requests   Final    BOTTLES DRAWN AEROBIC AND ANAEROBIC Blood Culture results may not be optimal due to an inadequate volume of blood received in culture bottles   Culture   Final    NO GROWTH 2 DAYS Performed at [facility], [address] [place], [place] [address]    Report Status PENDING  Incomplete  MRSA Next Gen by PCR, Nasal     Status: None   Collection Time: 06/29/24  9:46 PM   Specimen: Nasal Mucosa; Nasal Swab  Result Value Ref Range Status   MRSA by PCR Next Gen NOT DETECTED NOT DETECTED Final    Comment: (NOTE) The GeneXpert MRSA Assay (FDA approved for NASAL specimens  only), is one component of a comprehensive MRSA colonization surveillance program. It is not intended to diagnose MRSA infection nor to guide or monitor treatment for MRSA infections. Test performance is not FDA approved in patients less than 40 years old. Performed at [facility], [address] [place], [place] [address]          Radiology Studies: DG Swallowing Func-Speech Pathology Result Date: 06/29/2024 Table formatting from the original result was not included. Images from  the original result were not included. Modified Barium Swallow Study Patient Details Name: WHITLEY STRYCHARZ MRN: [id number] Date of Birth: 02-18-1941 Today's Date: 06/29/2024 HPI/PMH: HPI: GAELEN BRAGER is a 84 y.o. male who presented to the ED 1/15 via EMS for evaluation of fever and confusion. 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 Clinical Impression: Pt presents with a severe oropharyngeal dysphagia which is suspected to be chronic in nature c/b delayed or at times absent swallow initiation, reduced lingual strength and coordination, decreased base of tongue retraction, reduced hyolaryngeal elevation and excursion, incomplete laryngeal closure, intermittently absent epiglottic inversion, decreased duration and diameter of UES opening, and diminished sensation.  These deficits resulted in silent aspiration of all consistencies of liquid. There was no aspiration of thin liquids by spoon presentation. Suspect presumed cervical osteophytes (see image below) may be contributing to deficits, impacting both UES opening and epiglottic inversion which was largely absent with liquids, but present with heavier puree and solid bolus. Cued cough was weak and ineffective.  Pt unable to follow directions for  execution of compensatory strategies. With solid texture, pt did not initiate a swallow. He did not follow verbal cues to swallow.  Circumlaryngeal massage was not beneficial. A following bolus of puree was introduced to trigger swallow reflex. Pill simulation was not completed for safety concerns, but esophageal sweep following solid bolus revealed adequate esophageal clearance.  Pt was not consistently able to follow directions and is a poor candidate for swallowing therapy.   Will initiate puree diet with pudding thick liquids.  Consider palliative care consult and liberalization of diet for comfort.  Pt may have small amounts of unthickened water  by spoon, after good oral care, in moderation, when pt is fully awake/alert, with upright positioning and 1:1 assistance.  DIGEST Swallow Severity Rating*             Safety: 3             Efficiency: 3             Overall Pharyngeal Swallow Severity: 3 1: mild; 2: moderate; 3: severe; 4: profound *The Dynamic Imaging Grade of Swallowing Toxicity is standardized for the head and neck cancer population, however, demonstrates promising clinical applications across populations to standardize the clinical rating of pharyngeal swallow safety and severity.   Presumed cervical osteophytes: Factors that may increase risk of adverse event in presence of aspiration Noe & Lianne 2021): Factors that may increase risk of adverse event in presence of aspiration Noe & Lianne 2021): Reduced cognitive function; Limited mobility; Frail or deconditioned; Weak cough; Aspiration of thick, dense, and/or acidic materials Recommendations/Plan: Swallowing Evaluation Recommendations Swallowing Evaluation Recommendations Recommendations: PO diet PO Diet Recommendation: Dysphagia 1 (Pureed); Extremely thick liquids (Level 4, pudding thick) Liquid Administration via: Spoon Medication Administration: Crushed with puree Supervision: Staff to assist with self-feeding Swallowing strategies  :  Slow rate; Small bites/sips Postural changes: Position pt fully upright for meals Oral care recommendations: Oral care before ice chips/water ; Oral care BID (2x/day) Recommended consults: Consider Palliative care Caregiver Recommendations: Avoid jello, ice cream, thin soups, popsicles; Remove water  pitcher Treatment Plan Treatment Plan Treatment recommendations: Therapy as outlined in treatment plan below Follow-up recommendations: Skilled nursing-short term rehab (<3 hours/day) Functional status assessment: Patient has had a recent decline in their functional status and demonstrates  the ability to make significant improvements in function in a reasonable and predictable amount of time. Treatment frequency: Min 2x/week Treatment duration: 2 weeks Interventions: Diet toleration management by SLP; Patient/family education; Trials of upgraded texture/liquids Recommendations Recommendations for follow up therapy are one component of a multi-disciplinary discharge planning process, led by the attending physician.  Recommendations may be updated based on patient status, additional functional criteria and insurance authorization. Assessment: Orofacial Exam: Orofacial Exam Oral Cavity: Oral Hygiene: Xerostomia Oral Cavity - Dentition: Adequate natural dentition Orofacial Anatomy: WFL Oral Motor/Sensory Function: Suspected cranial nerve impairment CN VII - Facial: Left motor impairment Anatomy: Anatomy: Suspected cervical osteophytes Boluses Administered: Boluses Administered Boluses Administered: Thin liquids (Level 0); Mildly thick liquids (Level 2, nectar thick); Moderately thick liquids (Level 3, honey thick); Puree; Solid  Oral Impairment Domain: Oral Impairment Domain Lip Closure: Escape beyond mid-chin Tongue control during bolus hold: Posterior escape of greater than half of bolus Bolus preparation/mastication: Slow prolonged chewing/mashing with complete recollection Bolus transport/lingual motion:  Repetitive/disorganized tongue motion Oral residue: Trace residue lining oral structures Location of oral residue : Floor of mouth; Tongue Initiation of pharyngeal swallow : Pyriform sinuses; No visible initiation at any location  Pharyngeal Impairment Domain: Pharyngeal Impairment Domain Soft palate elevation: No bolus between soft palate (SP)/pharyngeal wall (PW) Anterior hyoid excursion: Partial anterior movement Epiglottic movement: No inversion Laryngeal vestibule closure: Incomplete, narrow column air/contrast in laryngeal vestibule Pharyngeal stripping wave : Present - diminished Pharyngeal contraction (A/P view only): N/A Pharyngoesophageal segment opening: Partial distention/partial duration, partial obstruction of flow Tongue base retraction: Narrow column of contrast or air between tongue base and PPW Pharyngeal residue: Collection of residue within or on pharyngeal structures Location of pharyngeal residue: Valleculae; Pyriform sinuses  Esophageal Impairment Domain: Esophageal Impairment Domain Esophageal clearance upright position: Complete clearance, esophageal coating Pill: Pill Consistency administered: -- (not assessed) Penetration/Aspiration Scale Score: Penetration/Aspiration Scale Score 1.  Material does not enter airway: Puree; Solid 8.  Material enters airway, passes BELOW cords without attempt by patient to eject out (silent aspiration) : Thin liquids (Level 0); Moderately thick liquids (Level 3, honey thick); Mildly thick liquids (Level 2, nectar thick) Compensatory Strategies: Compensatory Strategies Compensatory strategies: No (unable to execute)   General Information: Caregiver present: No  Diet Prior to this Study: NPO   No data recorded  Respiratory Status: WFL   Supplemental O2: None (Room air)   History of Recent Intubation: No  Behavior/Cognition: Alert; Cooperative; Requires cueing; Doesn't follow directions Self-Feeding Abilities: Able to self-feed Baseline vocal quality/speech:  Hypophonia/low volume Volitional Cough: Able to elicit Volitional Swallow: Unable to elicit Exam Limitations: No limitations Goal Planning: Prognosis for improved oropharyngeal function: Guarded Barriers to Reach Goals: Cognitive deficits; Severity of deficits (imact of anatomy on swallow function) No data recorded Patient/Family Stated Goal: none stated Consulted and agree with results and recommendations: Nurse; Physician; Pt unable/family or caregiver not available; Patient Pain: Pain Assessment Pain Assessment: -- (none reported) Breathing: 0 Negative Vocalization: 0 Facial Expression: 0 Body Language: 0 Consolability: 0 PAINAD Score: 0 End of Session: Start Time:SLP Start Time (ACUTE ONLY): 0908 Stop Time: SLP Stop Time (ACUTE ONLY): 0919 Time Calculation:SLP Time Calculation (min) (ACUTE ONLY): 11 min Charges: SLP Evaluations $ SLP Speech Visit: 1 Visit SLP Evaluations $BSS Swallow: 1 Procedure $MBS Swallow: 1 Procedure SLP visit diagnosis: SLP Visit Diagnosis: Dysphagia, oropharyngeal phase (R13.12) Past Medical History: Past Medical History: Diagnosis Date  Arthritis   low back  Basal cell carcinoma of face 12/26/2014  Mohs surgery jan 2016   Bladder stone   BPH (benign prostatic hyperplasia) 08/06/2007  Chronic kidney disease 2014  Stage III  Closed fracture of fifth metacarpal bone 05/15/2015  Eczema   Fasting hyperglycemia 12/21/2006  GERD (gastroesophageal reflux disease)   History of right MCA infarct 06/14/2004  HTN (hypertension) 07/19/2015  Hyperlipidemia   Kidney disease, chronic, stage III (GFR 30-59 ml/min) (HCC)   Major neurocognitive disorder 01/09/2014  Mild, related to stroke history  Nocturia   Parkinson disease (HCC)   Renal insufficiency 06/25/2013  S/P carotid endarterectomy   BILATERAL ICA--  PATENT PER DUPLEX  05-19-2012  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  Right calf  Urinary frequency   Vitamin D  deficiency  Past Surgical History: Past  Surgical History: Procedure Laterality Date  APPENDECTOMY  AS CHILD  CARDIOVASCULAR STRESS TEST  03-27-2012  DR CRENSHAW  LOW RISK LEXISCAN  STUDY-- PROBABLE NORMAL PERFUSION AND SOFT TISSUE ATTENUATION/  NO ISCHEMIA/ EF 51%  CAROTID ENDARTERECTOMY Bilateral LEFT  11-12-2008  DR GREG HAYES  RIGHT ICA  2006  ([facility])  CHOLECYSTECTOMY N/A 02/23/2022  Procedure: LAPAROSCOPIC CHOLECYSTECTOMY;  Surgeon: Lyndel Deward PARAS, MD;  Location: [facility] OR;  Service: General;  Laterality: N/A;  CYSTOSCOPY W/ RETROGRADES Bilateral 06/22/2021  Procedure: CYSTOSCOPY WITH RETROGRADE PYELOGRAM;  Surgeon: [name], MD;  Location: [facility];  Service: Urology;  Laterality: Bilateral;  CYSTOSCOPY WITH LITHOLAPAXY N/A 02/26/2013  Procedure: CYSTOSCOPY WITH LITHOLAPAXY;  Surgeon: Senior Matilda, MD;  Location: [facility];  Service: Urology;  Laterality: N/A;  ENDOSCOPIC RETROGRADE CHOLANGIOPANCREATOGRAPHY (ERCP) WITH PROPOFOL  N/A 02/22/2022  Procedure: ENDOSCOPIC RETROGRADE CHOLANGIOPANCREATOGRAPHY (ERCP) WITH PROPOFOL ;  Surgeon: [name], MD;  Location: [facility] END[facility]COPY;  Service: Gastroenterology;  Laterality: N/A;  EYE SURGERY  Jan. 2016  cataract surgery both eyes  INGUINAL HERNIA REPAIR Right 11-08-2006  IR KYPHO EA ADDL LEVEL THORACIC OR LUMBAR  02/12/2021  IR RADIOLOGIST EVAL & MGMT  02/18/2021  MASS EXCISION N/A 03/03/2016  Procedure: EXCISION OF BACK  MASS;  Surgeon: [name], MD;  Location: [facility];  Service: General;  Laterality: N/A;  MOHS SURGERY Left 1/ 2016  [name]-- Basal cell  PROSTATE SURGERY    REMOVAL OF STONES  02/22/2022  Procedure: REMOVAL OF STONES;  Surgeon: [name], MD;  Location: [facility] ENDOSCOPY;  Service: Gastroenterology;;  [name]  02/22/2022  Procedure: [name];  Surgeon: [name], MD;  Location: [facility] END[facility]COPY;  Service: Gastroenterology;;  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;  Service: Urology;  Laterality: N/A; Anette FORBES Grippe, MA, CCC-SLP Acute Rehabilitation Services Office: [phone] 06/29/2024, 9:54 AM  CT Head Wo Contrast Result Date: 06/28/2024 EXAM: CT HEAD WITHOUT CONTRAST 06/28/2024 09:27:00 PM TECHNIQUE: CT of the head was performed without the administration of intravenous contrast. Automated exposure control, iterative reconstruction, and/or weight based adjustment of the mA/kV was utilized to reduce the radiation dose to as low as reasonably achievable. COMPARISON: 06/20/2024 CLINICAL HISTORY: Mental  status change, unknown cause. FINDINGS: BRAIN AND VENTRICLES: Parenchymal volume loss is commensurate with the patient's age. Periventricular white matter changes are present likely reflecting the sequela of small vessel ischemia. Extensive cortical encephalomalacia involving the right frontotemporal and parietal cortices, in keeping with remote right MCA and ACA territory infarcts, is unchanged from prior examination. Ventricular size is mildly enlarged, commensurate with the degree of parenchymal atrophy and likely reflecting ex vacuo dilation. No acute hemorrhage. No evidence of acute infarct. No extra-axial collection. No mass effect or midline shift. ORBITS: No acute abnormality. SINUSES: Mastoid air cells and middle ear cavities are clear. SOFT TISSUES AND SKULL: No acute soft tissue abnormality. No skull  fracture. IMPRESSION: 1. No acute intracranial abnormality. 2. Extensive right frontotemporal and parietal cortical encephalomalacia compatible with remote right MCA and ACA territory infarcts, unchanged. 3. Age-commensurate senescent changes. Electronically signed by: Dorethia Molt MD 06/28/2024 09:38 PM EST RP Workstation: [id number]   DG Chest Port 1 View Result Date: 06/28/2024 EXAM: 1 VIEW XRAY OF THE CHEST 06/28/2024 09:05:00 PM COMPARISON: 06/20/2024 CLINICAL HISTORY: Questionable sepsis - evaluate for abnormality FINDINGS: LUNGS AND PLEURA: New patchy airspace opacity at the lung bases. No pleural effusion. No pneumothorax. HEART AND MEDIASTINUM: No acute abnormality of the cardiac and mediastinal silhouettes. BONES AND SOFT TISSUES: No acute osseous abnormality. IMPRESSION: 1. New patchy airspace opacity at the lung bases. Electronically signed by: Morgane Naveau MD 06/28/2024 09:14 PM EST RP Workstation: [id number]        Scheduled Meds:  amLODipine   10 mg Oral q morning   carBAMazepine   100 mg Oral q AM   And   carBAMazepine   100 mg Oral Q1400   And   carBAMazepine   200 mg Oral QHS   carbidopa -levodopa   2 tablet Oral TID   clopidogrel   75 mg Oral Daily   enoxaparin  (LOVENOX ) injection  30 mg Subcutaneous Q24H   hydrALAZINE   25 mg Oral TID   LORazepam   0.25 mg Oral q morning   And   LORazepam   1 mg Oral Q1400   And   LORazepam   1 mg Oral QHS   pantoprazole  (PROTONIX ) IV  40 mg Intravenous Daily   polyethylene glycol  17 g Oral Daily   tamsulosin   0.4 mg Oral QHS   traZODone   75 mg Oral QHS   Continuous Infusions:  ampicillin -sulbactam (UNASYN ) IV 3 g (06/30/24 0358)     LOS: 1 day    Time spent: 55 minutes.    Leatrice Chapel, MD  [facility] 7PM-7AM contact night coverage as above    "

## 2024-06-30 NOTE — Progress Notes (Signed)
 Initial Nutrition Assessment  DOCUMENTATION CODES:  Not applicable  INTERVENTION:  Multivitamin w/ minerals daily Encourage good PO intake Feeding assist with all meals  NUTRITION DIAGNOSIS:  Increased nutrient needs related to acute illness as evidenced by estimated needs.  GOAL:  Patient will meet greater than or equal to 90% of their needs  MONITOR:  PO intake, Labs, Weight trends, I & O's  REASON FOR ASSESSMENT:  Consult Assessment of nutrition requirement/status  ASSESSMENT:  84 y.o. male presented to the ED with fever and confusion. PMH includes HTN, HLD, CVA, Parkinson's disease, CKD III, BPH, dementia, and GERD. Pt admitted with sepsis secondary PNA and acute metabolic encephalopathy.   1/16 - Admitted; MBS, diet advanced to Dysphagia 1, Pudding Thick Liquids  RD working remotely at time of assessment. Family met with Palliative team, noted that patient would not want a feeding tube. Patient disoriented and unable to provide history.   No meal intakes have been recorded. Per PMT note, wife reports that patient is eating well on current diet. RN reports that patient is eating ~50% of his meals but not drinking as much given the liquid consistency. RN shares that wife has questions regarding diet, RD team will attempt to see prior to discharge and recommend SLP involvement as well.   Per chart review, patient weighs appear to be pulled forward from previous encounters. Admission/Current Weight: 62.5 kg  Nutrition Related Medications: Protonix , Miralax , IV Unasyn  Labs: reviewed   NUTRITION - FOCUSED PHYSICAL EXAM: Deferred.  Diet Order:   Diet Order             DIET - DYS 1 Fluid consistency: Pudding Thick  Diet effective now                  EDUCATION NEEDS:  Not appropriate for education at this time  Skin:  Skin Assessment: Reviewed RN Assessment  Last BM:  1/15  Height:  Ht Readings from Last 1 Encounters:  06/28/24 5' 10 (1.778 m)   Weight:   Wt Readings from Last 1 Encounters:  06/28/24 62.5 kg   Ideal Body Weight:  75.5 kg  BMI:  Body mass index is 19.77 kg/m.  Estimated Nutritional Needs:  Kcal:  1600-1800 Protein:  80-100 grams Fluid:  >/= 1.6 L   Nestora Glatter RD, LDN Registered Dietitian I Please see AMION for contact information

## 2024-06-30 NOTE — Evaluation (Signed)
 Occupational Therapy Evaluation Patient Details Name: William [name] MRN: [id number] DOB: 01-04-1941 Today's Date: 06/30/2024   History of Present Illness   William [name] is a 84 y.o. male presented to the ED via EMS for evaluation of fever and confusion. CT head showing no acute intracranial abnormality.   Probable aspiration PNA.  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  Mod assist hand held for transfers and short distance mobility.  Prior to admission pt was needing min assist for mobility and min to mod for bathing/dressing tasks secondary to prior diagnosis of CVA and Parkinson's Dementia.  Feel he will benefit from acute care OT at this time in order to get back to a safe level for return home with spouse who can provide 24 hr assist.  Recommend HHOT for follow-up to continue progress.     If plan is discharge home, recommend the following:   A lot of help with walking and/or transfers;A lot of help with bathing/dressing/bathroom;Assist for transportation;Help with stairs or ramp for entrance;Supervision due to cognitive status;Direct supervision/assist for medications management;Direct supervision/assist for financial management     Functional Status Assessment   Patient has had a recent decline in their functional status and demonstrates the ability to make significant improvements in function in a reasonable and predictable amount of time.     Equipment Recommendations   None recommended by OT      Precautions/Restrictions   Precautions Precautions: Fall Precaution/Restrictions Comments: hx of left hemiparesis Restrictions Weight Bearing Restrictions Per Provider Order: No     Mobility Bed Mobility Overal bed  mobility: Needs Assistance Bed Mobility: Sit to Supine       Sit to supine: Mod assist   General bed mobility comments: Assist with coming down onto the left side and bringing LEs up in the bed.    Transfers Overall transfer level: Needs assistance Equipment used: None, 2 person hand held assist Transfers: Sit to/from Stand, Bed to chair/wheelchair/BSC Sit to Stand: Mod assist     Step pivot transfers: Mod assist            Balance Overall balance assessment: Needs assistance Sitting-balance support: Single extremity supported Sitting balance-Leahy Scale: Fair     Standing balance support: During functional activity, Single extremity supported Standing balance-Leahy Scale: Poor Standing balance comment: Pt needs assist to maintain standing balance during mobility                           ADL either performed or assessed with clinical judgement   ADL Overall ADL's : Needs assistance/impaired Eating/Feeding: Set up;Sitting   Grooming: Wash/dry face;Sitting;Set up   Upper Body Bathing: Minimal assistance;Sitting   Lower Body Bathing: Moderate assistance;Sit to/from stand   Upper Body Dressing : Moderate assistance;Sitting   Lower Body Dressing: Maximal assistance;Sit to/from stand   Toilet Transfer: Moderate assistance;Ambulation           Functional mobility during ADLs: Moderate assistance (ambulation without assistive device) General ADL Comments: Pt's spouse reports assist with all ADLs.  Pt ambulates with use of a gait belt with spouse standing on the right side.  She reports some use of the lift chair at home which therapist advised against.  She says they only  raise it up part of the way so he still has to work.  May benefit from palm guard vs right hand splint secondary to left digit flexor tone in the hand.  Pt able to actively release the digits but inconsistent.  Will continue to evaluate for better positioning.     Vision Baseline  Vision/History: 0 No visual deficits Ability to See in Adequate Light: 0 Adequate Patient Visual Report: No change from baseline Vision Assessment?: No apparent visual deficits     Perception Perception: Not tested       Praxis Praxis: Not tested       Pertinent Vitals/Pain Pain Assessment Pain Assessment: No/denies pain     Extremity/Trunk Assessment Upper Extremity Assessment Upper Extremity Assessment: LUE deficits/detail LUE Deficits / Details: History of left hemiparesis.  Increased flexor tone in the finger flexors and wrist flexors.  Brunnstrum stage III level for functional use.  Uses as a stablizer at times   Lower Extremity Assessment Lower Extremity Assessment: Defer to PT evaluation   Cervical / Trunk Assessment Cervical / Trunk Assessment: Normal   Communication Communication Communication: Impaired Factors Affecting Communication: Reduced clarity of speech   Cognition Arousal: Alert Behavior During Therapy: Flat affect Cognition: History of cognitive impairments             OT - Cognition Comments: Pt still with slight cognitive impaiment compared to baseline per spouse.                 Following commands: Intact       Cueing  General Comments   Cueing Techniques: Verbal cues;Tactile cues              Home Living Family/patient expects to be discharged to:: Private residence Living Arrangements: Spouse/significant other Available Help at Discharge: Available 24 hours/day;Personal care attendant Type of Home: House Home Access: Stairs to enter [employer] of Steps: 1 small step up, less than 6 inches   Home Layout: One level     Bathroom Shower/Tub: Chief Strategy Officer: Handicapped height     Home Equipment: Agricultural Consultant (2 wheels);Cane - single point;Lift chair;Tub bench          Prior Functioning/Environment Prior Level of Function : Needs assist             Mobility Comments: Pt  ambulates without assistive device and she stands on the right side of patient with use of the gait belt. ADLs Comments: Pt's wife assist with bathing, dressing, and toileting    OT Problem List: Impaired balance (sitting and/or standing);Decreased strength;Decreased knowledge of use of DME or AE;Impaired UE functional use   OT Treatment/Interventions: Self-care/ADL training;Patient/family education;Balance training;Neuromuscular education;Therapeutic activities;DME and/or AE instruction      OT Goals(Current goals can be found in the care plan section)   Acute Rehab OT Goals Patient Stated Goal: Pt did not state but agreeable to OT OT Goal Formulation: With patient/family Time For Goal Achievement: 07/14/24 Potential to Achieve Goals: Good   OT Frequency:  Min 2X/week       AM-PAC OT 6 Clicks Daily Activity     Outcome Measure Help from another person eating meals?: A Little Help from another person taking care of personal grooming?: A Little Help from another person toileting, which includes using toliet, bedpan, or urinal?: A Lot Help from another person bathing (including washing, rinsing, drying)?: A Lot Help from another person to put on and taking off regular upper body clothing?: A Lot  Help from another person to put on and taking off regular lower body clothing?: A Lot 6 Click Score: 14   End of Session Equipment Utilized During Treatment: Gait belt Nurse Communication: Mobility status  Activity Tolerance: Patient limited by fatigue Patient left: in bed;with call bell/phone within reach;with nursing/sitter in room  OT Visit Diagnosis: Unsteadiness on feet (R26.81);Other abnormalities of gait and mobility (R26.89);Muscle weakness (generalized) (M62.81);Hemiplegia and hemiparesis;Other symptoms and signs involving cognitive function Hemiplegia - Right/Left: Right Hemiplegia - dominant/non-dominant: Non-Dominant Hemiplegia - caused by: Cerebral infarction                 Time: 8467-8379 OT Time Calculation (min): 48 min Charges:  OT General Charges $OT Visit: 1 Visit OT Evaluation $OT Eval Moderate Complexity: 1 Mod OT Treatments $Self Care/Home Management : 23-37 mins  Lynwood Constant, OTR/L Acute Rehabilitation Services  Office [phone] 06/30/2024

## 2024-06-30 NOTE — Progress Notes (Signed)
 " Daily Progress Note   Date: 06/30/2024   Patient Name: William [name]  DOB: May 08, 1941  MRN: [id number]  Age / Sex: 84 y.o., male  Attending Physician: Rosario Leatrice FERNS, MD Primary Care Physician: Antonio Meth, Jamee SAUNDERS, DO Admit Date: 06/28/2024 Length of Stay: 1 day  Reason for Follow-up: Establishing goals of care  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   HTN (hypertension) 07/19/2015   Hyperlipidemia    Kidney disease, chronic, stage III (GFR 30-59 ml/min) (HCC)    Major neurocognitive disorder 01/09/2014   Mild, related to stroke history   Nocturia    Parkinson disease (HCC)    Renal insufficiency 06/25/2013   S/P carotid endarterectomy    BILATERAL ICA--  PATENT PER DUPLEX  05-19-2012   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 Updates received. Patient Assessed. Created space and opportunity for patient  and family to explore thoughts and feelings regarding current medical situation.  Today's Discussion: Today before meeting with the patient/family, I reviewed the chart notes including hospice note from yesterday, nurse notes today. I also reviewed vital signs, nursing flowsheets, medication administrations record, labs, and imaging. Labs reviewed include MRSA swab yesterday, negative.  Legionella still in process, strep pneumoniae negative all in the setting of sepsis likely due to aspiration pneumonia.  Overall the patient seems a little bit improved  today, has been getting a pured diet based on SLP recommendations.  Today saw the patient at bedside, his wife is present.  He is more awake and alert today.  He denies pain, nausea, vomiting.  He states he feels good.  His wife is impressed that he is eating quite well even a pured diet.  We spent a lot of time talking about pured diet, options and possibilities, how to appropriately pured diet, the types of foods that can be pured.  I answered her questions to the best of my ability but told her I would talk with the hospitalist about getting SLP or dietitian to better answer her questions.  We spent time talking about his wife's ability to care for the patient.  She is very devoted and is preparing and planning for how she can best take care of him with his pured diet and changes.  She has a good understanding on the likelihood that his aspiration will likely continue to be a problem, even with precautions such as pured diet.  We spent time talking about support outside of the hospital including possibility of outpatient palliative care.  After discussing what this entails, she is agreeable to talking with outpatient palliative care to see what services they could provide.  I offered choice and she requested to work with hospice to [employer].  I shared that palliative medicine would follow-up on Monday for any progress and ongoing conversations depending on how he does in the next 24 to 48  hours.  I offered that we are available before then for any needs or concerns and confirms she has our contact information.  I additionally provided her second card with our contact number.  I provided emotional and general support through therapeutic listening, empathy, sharing of stories, and other techniques. I answered all questions and addressed all concerns to the best of my ability.  Review of Systems  Constitutional:        Denies pain in general  Respiratory:  Negative for  shortness of breath.   Cardiovascular:  Negative for chest pain.  Gastrointestinal:  Negative for abdominal pain, nausea and vomiting.    Objective:   Primary Diagnoses: Present on Admission:  Pneumonia  Acute metabolic encephalopathy  Parkinson's disease (HCC)  CAP (community acquired pneumonia)   Vital Signs:  BP (!) 176/65 (BP Location: Left Arm)   Pulse 67   Temp 97.6 F (36.4 C)   Resp 20   Ht 5' 10 (1.778 m)   Wt 62.5 kg   SpO2 99%   BMI 19.77 kg/m   Physical Exam Vitals and nursing note reviewed.  Constitutional:      General: He is not in acute distress.    Appearance: He is ill-appearing. He is not toxic-appearing.     Comments: Appears frail/weak  HENT:     Head: Normocephalic and atraumatic.  Cardiovascular:     Rate and Rhythm: Normal rate.  Pulmonary:     Effort: Pulmonary effort is normal. No respiratory distress.  Abdominal:     General: Abdomen is flat. There is no distension.     Palpations: Abdomen is soft.  Skin:    General: Skin is warm and dry.  Neurological:     General: No focal deficit present.     Mental Status: He is alert.  Psychiatric:        Mood and Affect: Mood normal.        Behavior: Behavior normal.     Palliative Assessment/Data: 40-50%   Existing Vynca/ACP Documentation: Advance directive signed 10/20/2004 DNR effective 11 01/14/2023 GOC signed 06/29/2024  Assessment & Plan:   HPI/Patient Profile:  84 y.o. 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    Palliative medicine was  consulted for GOC conversations.  SUMMARY OF RECOMMENDATIONS   DNR-Limited (DNR/DNI) Does not want a feeding tube Seems to be tolerating pured diet quite well Request SLP or dietitian follow-up for questions about pured diet at home [facility] consult placed for referral to outpatient palliative care with [facility] [name]; their liaison notified as well Palliative medicine will follow-up on 07/02/2024 for progress and ongoing conversations  Symptom Management:  Per primary team Palliative medicine is available to assist as needed  Code Status: DNR - Limited (DNR/DNI)  Prognosis: Unable to determine  Discharge Planning: To Be Determined  Discussed with: Patient, family, medical team, nursing team  Thank you for allowing us  to participate in the care of RANDALE CARVALHO PMT will continue to support holistically.  Time Total: 60 min  Detailed review of medical records (labs, imaging, vital signs), medically appropriate exam, discussed with  treatment team, counseling and education to patient, family, & staff, documenting clinical information, medication management, coordination of care  Camellia Kays, NP Palliative Medicine Team  Team Phone # [phone] (Nights/Weekends)  02/10/2021, 8:17 AM  "

## 2024-07-01 DIAGNOSIS — J690 Pneumonitis due to inhalation of food and vomit: Secondary | ICD-10-CM | POA: Diagnosis not present

## 2024-07-01 LAB — CBC WITH DIFFERENTIAL/PLATELET
Abs Immature Granulocytes: 0.03 K/uL (ref 0.00–0.07)
Basophils Absolute: 0.1 K/uL (ref 0.0–0.1)
Basophils Relative: 1 %
Eosinophils Absolute: 0.2 K/uL (ref 0.0–0.5)
Eosinophils Relative: 3 %
HCT: 27.6 % — ABNORMAL LOW (ref 39.0–52.0)
Hemoglobin: 9.1 g/dL — ABNORMAL LOW (ref 13.0–17.0)
Immature Granulocytes: 0 %
Lymphocytes Relative: 26 %
Lymphs Abs: 2 K/uL (ref 0.7–4.0)
MCH: 32.6 pg (ref 26.0–34.0)
MCHC: 33 g/dL (ref 30.0–36.0)
MCV: 98.9 fL (ref 80.0–100.0)
Monocytes Absolute: 0.6 K/uL (ref 0.1–1.0)
Monocytes Relative: 8 %
Neutro Abs: 4.9 K/uL (ref 1.7–7.7)
Neutrophils Relative %: 62 %
Platelets: 159 K/uL (ref 150–400)
RBC: 2.79 MIL/uL — ABNORMAL LOW (ref 4.22–5.81)
RDW: 13.4 % (ref 11.5–15.5)
WBC: 7.8 K/uL (ref 4.0–10.5)
nRBC: 0 % (ref 0.0–0.2)

## 2024-07-01 LAB — RENAL FUNCTION PANEL
Albumin: 3 g/dL — ABNORMAL LOW (ref 3.5–5.0)
Anion gap: 8 (ref 5–15)
BUN: 34 mg/dL — ABNORMAL HIGH (ref 8–23)
CO2: 27 mmol/L (ref 22–32)
Calcium: 8.5 mg/dL — ABNORMAL LOW (ref 8.9–10.3)
Chloride: 109 mmol/L (ref 98–111)
Creatinine, Ser: 1.49 mg/dL — ABNORMAL HIGH (ref 0.61–1.24)
GFR, Estimated: 47 mL/min — ABNORMAL LOW
Glucose, Bld: 95 mg/dL (ref 70–99)
Phosphorus: 3.5 mg/dL (ref 2.5–4.6)
Potassium: 4.4 mmol/L (ref 3.5–5.1)
Sodium: 144 mmol/L (ref 135–145)

## 2024-07-01 LAB — MAGNESIUM: Magnesium: 1.9 mg/dL (ref 1.7–2.4)

## 2024-07-01 MED ORDER — BISACODYL 5 MG PO TBEC
10.0000 mg | DELAYED_RELEASE_TABLET | Freq: Every day | ORAL | Status: DC | PRN
  Administered 2024-07-01: 10 mg via ORAL
  Filled 2024-07-01: qty 2

## 2024-07-01 NOTE — Progress Notes (Signed)
 " PROGRESS NOTE    William [name]  FMW:[id number] DOB: 1940/11/15 DOA: 06/28/2024 PCP: [name], DO  Outpatient Specialists:    Brief narrative: As per H&P done on presentation: William [name] is a 84 y.o. 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  Vital signs on arrival to the ED: Temperature 101.9 F, pulse 114, respiratory rate 23, blood pressure 173/62, and SpO2 100% on room air.  Labs notable for WBC count 17.5, hemoglobin 11.2 (stable), glucose 136, creatinine 1.75 (stable), COVID/influenza/RSV PCR negative, blood cultures in process, UA not suggestive of infection, lactic acid normal x 2.  Chest x-ray showing new patchy airspace opacity at the lung bases.  CT head showing no acute intracranial abnormality.  Patient was given Tylenol , cefepime , and 1.5 L IV fluids.  TRH called to admit.   Patient is slightly confused and history provided mostly by his wife at bedside who states patient was diagnosed with UTI a week ago and started on antibiotics.  Tonight he started having chills and his breathing was funny.  Also patient had complained of chest pain so wife called EMS.  He has been coughing.  He goes to daycare 3 times a week.  No abdominal pain, vomiting, or diarrhea reported.  Patient denies any chest pain or shortness of breath at this time.  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  07/01/2024: Seen alongside patient's wife.  Patient continues to improve slowly.  Palliative care input is appreciated.  Continue antibiotics for now.  Assessment & Plan:   Principal Problem:   Pneumonia Active Problems:   Chest pain   Acute metabolic encephalopathy   Parkinson's disease (HCC)    Sepsis (HCC)   CAP (community acquired pneumonia)   Sepsis secondary to pneumonia: - Continue antibiotics. -Continue aspiration precautions.  Acute metabolic encephalopathy: - Likely secondary to above. - Improving.  Hypertension: - Continue to monitor closely.  History of CVA: - Continue Plavix .  BPH: - Continue Flomax .  Chronic kidney disease stage IIIb: - Stable.  Chronic anemia: - Stable.  GERD: - Continue pantoprazole .  Dementia: - No behavioral problems.   DVT prophylaxis: Subcutaneous Lovenox  Code Status: DNR/DNI Family Communication: Wife at bedside Disposition Plan:    Consultants:  Palliative care team.  Procedures:  None.  Antimicrobials:  IV Unasyn .   Subjective: No new complaints. Slowly improving.  Objective: Vitals:   06/30/24 2035 07/01/24 0022 07/01/24 0506 07/01/24 0748  BP: (!) 140/57 121/61 (!) 161/65 (!) 161/75  Pulse: 72 70 64 73  Resp: 17 18 18 20   Temp: 98.7 F (37.1 C) 98.2 F (36.8 C) (!) 97.5 F (36.4 C) 98.4 F (36.9 C)  TempSrc:    Oral  SpO2: 98% 97% 98% 96%  Weight:      Height:        Intake/Output Summary (Last 24 hours) at 07/01/2024 1209 Last data filed at 07/01/2024 0700 Gross per 24 hour  Intake --  Output 1000 ml  Net -1000 ml   Filed Weights   06/28/24 2047  Weight: 62.5 kg    Examination:  General exam: Appears calm and comfortable  Respiratory system: Clear to auscultation.  Cardiovascular system: S1 & S2  Gastrointestinal system: Abdomen is soft and nontender. Central nervous system:  Awake and aler.  Data Reviewed: I have personally reviewed following labs and imaging studies  CBC: Recent Labs  Lab 06/28/24 2118 06/29/24 0310 07/01/24 0417  WBC 17.5* 18.9* 7.8  NEUTROABS 16.0*  --  4.9  HGB 11.2* 9.8* 9.1*  HCT 33.8* 29.8* 27.6*  MCV 100.0 100.3* 98.9  PLT 217 188 159   Basic Metabolic Panel: Recent Labs  Lab 06/28/24 2118 06/29/24 0310 07/01/24 0417  NA 142 141 144   K 4.6 4.7 4.4  CL 106 108 109  CO2 24 25 27   GLUCOSE 136* 119* 95  BUN 35* 34* 34*  CREATININE 1.75* 1.63* 1.49*  CALCIUM  8.6* 8.5* 8.5*  MG  --   --  1.9  PHOS  --   --  3.5   GFR: Estimated Creatinine Clearance: 34.4 mL/min (A) (by C-G formula based on SCr of 1.49 mg/dL (H)). Liver Function Tests: Recent Labs  Lab 06/28/24 2118 07/01/24 0417  AST 18  --   ALT <5  --   ALKPHOS 112  --   BILITOT 0.2  --   PROT 6.8  --   ALBUMIN 3.6 3.0*   No results for input(s): LIPASE, AMYLASE in the last 168 hours. Recent Labs  Lab 06/29/24 0310  AMMONIA 27   Coagulation Profile: Recent Labs  Lab 06/28/24 2118  INR 1.0   Cardiac Enzymes: No results for input(s): CKTOTAL, CKMB, CKMBINDEX, TROPONINI in the last 168 hours. BNP (last 3 results) No results for input(s): PROBNP in the last 8760 hours. HbA1C: No results for input(s): HGBA1C in the last 72 hours. CBG: No results for input(s): GLUCAP in the last 168 hours. Lipid Profile: No results for input(s): CHOL, HDL, LDLCALC, TRIG, CHOLHDL, LDLDIRECT in the last 72 hours. Thyroid  Function Tests: Recent Labs    06/29/24 0310  TSH 0.832   Anemia Panel: Recent Labs    06/29/24 0310  VITAMINB12 953*   Urine analysis:    Component Value Date/Time   COLORURINE YELLOW 06/28/2024 2110   APPEARANCEUR CLEAR 06/28/2024 2110   APPEARANCEUR Clear 08/27/2021 0740   LABSPEC 1.020 06/28/2024 2110   PHURINE 6.0 06/28/2024 2110   GLUCOSEU NEGATIVE 06/28/2024 2110   HGBUR MODERATE (A) 06/28/2024 2110   HGBUR negative 09/25/2009 1535   BILIRUBINUR NEGATIVE 06/28/2024 2110   BILIRUBINUR negative 06/16/2023 1153   BILIRUBINUR Negative 08/27/2021 0740   KETONESUR NEGATIVE 06/28/2024 2110   PROTEINUR 100 (A) 06/28/2024 2110   UROBILINOGEN 0.2 06/16/2023 1153   UROBILINOGEN 0.2 09/25/2009 1535   NITRITE NEGATIVE 06/28/2024 2110   LEUKOCYTESUR NEGATIVE 06/28/2024 2110   Sepsis  Labs: @LABRCNTIP (procalcitonin:4,lacticidven:4)  ) Recent Results (from the past 240 hours)  Resp panel by RT-PCR (RSV, Flu A&B, Covid) Anterior Nasal Swab     Status: None   Collection Time: 06/28/24  8:45 PM   Specimen: Anterior Nasal Swab  Result Value Ref Range Status   SARS Coronavirus 2 by RT PCR NEGATIVE NEGATIVE Final   Influenza A by PCR NEGATIVE NEGATIVE Final   Influenza B by PCR NEGATIVE NEGATIVE Final    Comment: (NOTE) The Xpert Xpress SARS-CoV-2/FLU/RSV plus assay is intended as an aid in the diagnosis of influenza from Nasopharyngeal swab specimens and should not be used as a sole basis for treatment. Nasal washings and aspirates are unacceptable for Xpert Xpress SARS-CoV-2/FLU/RSV testing.  Fact Sheet for Patients: bloggercourse.com  Fact Sheet for Healthcare Providers: seriousbroker.it  This test is not yet approved or cleared by the [place]  FDA and  has been authorized for detection and/or diagnosis of SARS-CoV-2 by FDA under an Emergency Use Authorization (EUA). This EUA will remain in effect (meaning this test can be used) for the duration of the COVID-19 declaration under Section 564(b)(1) of the Act, 21 U.S.C. section 360bbb-3(b)(1), unless the authorization is terminated or revoked.     Resp Syncytial Virus by PCR NEGATIVE NEGATIVE Final    Comment: (NOTE) Fact Sheet for Patients: bloggercourse.com  Fact Sheet for Healthcare Providers: seriousbroker.it  This test is not yet approved or cleared by the [place]  FDA and has been authorized for detection and/or diagnosis of SARS-CoV-2 by FDA under an Emergency Use Authorization (EUA). This EUA will remain in effect (meaning this test can be used) for the duration of the COVID-19 declaration under Section 564(b)(1) of the Act, 21 U.S.C. section 360bbb-3(b)(1), unless the authorization is  terminated or revoked.  Performed at [facility], [address] [place], [place] [address]   Blood Culture (routine x 2)     Status: None (Preliminary result)   Collection Time: 06/28/24  8:45 PM   Specimen: BLOOD  Result Value Ref Range Status   Specimen Description BLOOD SITE NOT SPECIFIED  Final   Special Requests   Final    BOTTLES DRAWN AEROBIC AND ANAEROBIC Blood Culture results may not be optimal due to an inadequate volume of blood received in culture bottles   Culture   Final    NO GROWTH 3 DAYS Performed at [facility], [address] [place], [place] [address]    Report Status PENDING  Incomplete  Blood Culture (routine x 2)     Status: None (Preliminary result)   Collection Time: 06/28/24  8:50 PM   Specimen: BLOOD  Result Value Ref Range Status   Specimen Description BLOOD SITE NOT SPECIFIED  Final   Special Requests   Final    BOTTLES DRAWN AEROBIC AND ANAEROBIC Blood Culture results may not be optimal due to an inadequate volume of blood received in culture bottles   Culture   Final    NO GROWTH 3 DAYS Performed at [facility], [address] [place], [place] [address]    Report Status PENDING  Incomplete  MRSA Next Gen by PCR, Nasal     Status: None   Collection Time: 06/29/24  9:46 PM   Specimen: Nasal Mucosa; Nasal Swab  Result Value Ref Range Status   MRSA by PCR Next Gen NOT DETECTED NOT DETECTED Final    Comment: (NOTE) The GeneXpert MRSA Assay (FDA approved for NASAL specimens only), is one component of a comprehensive MRSA colonization surveillance program. It is not intended to diagnose MRSA infection nor to guide or monitor treatment for MRSA infections. Test performance is not FDA approved in patients less than 109 years old. Performed at [facility], [address], [place], [place] [address]          Radiology Studies: No results found.       Scheduled Meds:  amLODipine   10 mg Oral q morning    carBAMazepine   100 mg Oral q AM   And   carBAMazepine   100 mg Oral Q1400   And   carBAMazepine   200 mg Oral QHS   carbidopa -levodopa   2 tablet Oral TID   clopidogrel   75 mg Oral Daily   enoxaparin  (LOVENOX ) injection  30 mg Subcutaneous Q24H   hydrALAZINE   25 mg Oral TID   LORazepam   0.25 mg  Oral q morning   And   LORazepam   1 mg Oral Q1400   And   LORazepam   1 mg Oral QHS   multivitamin with minerals  1 tablet Oral Daily   pantoprazole  (PROTONIX ) IV  40 mg Intravenous Daily   polyethylene glycol  17 g Oral Daily   tamsulosin   0.4 mg Oral QHS   traZODone   75 mg Oral QHS   Continuous Infusions:  ampicillin -sulbactam (UNASYN ) IV 3 g (07/01/24 0551)     LOS: 2 days    Time spent: 35 minutes.    Leatrice Chapel, MD  [facility] 7PM-7AM contact night coverage as above    "

## 2024-07-01 NOTE — Evaluation (Signed)
 Physical Therapy Evaluation Patient Details Name: William [name] MRN: [id number] DOB: Aug 07, 1940 Today's Date: 07/01/2024  History of Present Illness  84 y.o. male admitted 06/28/24 with fever, confusion. Head CT negative for acute injury. Workup for sepsis, PNA; concern for aspiration. PMH includes vascular dementia, Parkinson's disease, CVA (L-side weakness), CKD, bladder CA, arthritis, dysphagia, anemia.   Clinical Impression  Pt presents with an overall decrease in functional mobility secondary to above. PTA, pt lives with wife who assists him with majority of mobility and ADL tasks; pt walks with handheld assist and gait belt. Today, pt able to transfer and ambulate short distances with HHA and min-modA. Pt's wife present and supportive, plans to have pt return home with continued family support. Pt would benefit from continued acute PT services to maximize functional mobility and independence prior to d/c with HHPT.       If plan is discharge home, recommend the following: A lot of help with walking and/or transfers;A lot of help with bathing/dressing/bathroom;Assistance with cooking/housework;Assistance with feeding;Direct supervision/assist for medications management;Direct supervision/assist for financial management;Assist for transportation;Help with stairs or ramp for entrance;Supervision due to cognitive status   Can travel by private vehicle    Yes (pending family ability to assist)    Equipment Recommendations None recommended by PT  Recommendations for Other Services       Functional Status Assessment Patient has had a recent decline in their functional status and demonstrates the ability to make significant improvements in function in a reasonable and predictable amount of time.     Precautions / Restrictions Precautions Precautions: Fall;Other (comment) Recall of Precautions/Restrictions: Impaired Precaution/Restrictions Comments: h/o CVA with residual L side hemiparesis;  bladder incontinence Restrictions Weight Bearing Restrictions Per Provider Order: No      Mobility  Bed Mobility Overal bed mobility: Needs Assistance Bed Mobility: Supine to Sit     Supine to sit: Mod assist, HOB elevated     General bed mobility comments: modA for trunk elevation and scooting hips forward    Transfers Overall transfer level: Needs assistance Equipment used: None, 1 person hand held assist Transfers: Sit to/from Stand Sit to Stand: Mod assist           General transfer comment: multiple sit<>stands from EOB (1x), recliner (2x), and chair in front of sink (1x via pulling on sink) with modA for trunk elevation, cues for sequencing (including counting 1,2,3,up)    Ambulation/Gait Ambulation/Gait assistance: Mod assist, Min assist Gait Distance (Feet): 24 Feet Assistive device: 1 person hand held assist Gait Pattern/deviations: Step-to pattern, Step-through pattern, Decreased stride length, Trunk flexed, Shuffle Gait velocity: Decreased     General Gait Details: slow, mildly unsteady gait with RUE HHA and gait belt support for trunk (12' + 12'); initial modA for stability progressing to intermittent minA; cues for big step to which pt states, I don't take big steps, cues for upright posture which pt attributes to back pain. seated rest at sink secondary to fatigue completing standing ADL tasks  Stairs            Wheelchair Mobility     Tilt Bed    Modified Rankin (Stroke Patients Only)       Balance Overall balance assessment: Needs assistance Sitting-balance support: No upper extremity supported Sitting balance-Leahy Scale: Fair Sitting balance - Comments: increased forward flexion with prolonged seated ADL tasks (brushing teeth sitting at sink) requiring cues for upright (pt endorses back pain)   Standing balance support: During functional activity, Single extremity  supported Standing balance-Leahy Scale: Poor Standing balance  comment: reliant on external assist to maintain balance with standing ADLs at sink (washing hands, washing face); can static stand with CGA with RUE support                             Pertinent Vitals/Pain Pain Assessment Pain Assessment: Faces Faces Pain Scale: Hurts little more Pain Location: back Pain Descriptors / Indicators: Discomfort, Guarding, Grimacing Pain Intervention(s): Monitored during session, Limited activity within patient's tolerance, Repositioned    Home Living Family/patient expects to be discharged to:: Private residence Living Arrangements: Spouse/significant other Available Help at Discharge: Family;Available 24 hours/day Type of Home: House Home Access: Stairs to enter   [employer] of Steps: 1 small step up, less than 6 inches   Home Layout: One level Home Equipment: [employer] (2 wheels);Cane - single point;Lift chair;Tub bench      Prior Function Prior Level of Function : Needs assist             Mobility Comments: bed mobility, transfers and ambulates with assist from wife via gait belt (she stands on his R side), pt does not use DME (they've tried multiple types of DME). works with OPPT at [employer] ADLs Comments: wife assists with bathing, dressing, toileting and iADLs     Extremity/Trunk Assessment   Upper Extremity Assessment Upper Extremity Assessment: Generalized weakness;LUE deficits/detail LUE Deficits / Details: h/o CVA with L hemiparesis; increased wrist/finger flexor tone; pt using LUE as stabilizer sometimes LUE Coordination: decreased gross motor;decreased fine motor    Lower Extremity Assessment Lower Extremity Assessment: Generalized weakness;LLE deficits/detail LLE Deficits / Details: h/o CVA with L hemiparesis; functional LLE strength 3- to 3+/5    Cervical / Trunk Assessment Cervical / Trunk Assessment: Kyphotic  Communication   Communication Communication: Impaired Factors Affecting  Communication: Reduced clarity of speech    Cognition Arousal: Alert Behavior During Therapy: Flat affect   PT - Cognitive impairments: History of cognitive impairments                       PT - Cognition Comments: h/o vascular dementia, Parkinson's disease, CVA. follows simple commands with increased cues Following commands: Intact       Cueing Cueing Techniques: Verbal cues, Tactile cues     General Comments General comments (skin integrity, edema, etc.): pt's wife present and supportive; wife asking about L hand splint that OT mentioned yesterday, reached out to OT team regarding this. educ re: role of acute PT, POC, activity recommendations, potential d/c needs. wife hopes to do initial HHPT/OT while pt recovers then return to OPPT    Exercises     Assessment/Plan    PT Assessment Patient needs continued PT services  PT Problem List Decreased strength;Decreased range of motion;Decreased activity tolerance;Decreased balance;Decreased mobility;Decreased coordination;Decreased cognition;Pain;Impaired tone       PT Treatment Interventions DME instruction;Gait training;Stair training;Functional mobility training;Therapeutic activities;Therapeutic exercise;Balance training;Patient/family education;Neuromuscular re-education    PT Goals (Current goals can be found in the Care Plan section)  Acute Rehab PT Goals Patient Stated Goal: return home PT Goal Formulation: With patient/family Time For Goal Achievement: 07/15/24 Potential to Achieve Goals: Good    Frequency Min 2X/week     Co-evaluation               AM-PAC PT 6 Clicks Mobility  Outcome Measure Help needed turning from your back to your side  while in a flat bed without using bedrails?: A Lot Help needed moving from lying on your back to sitting on the side of a flat bed without using bedrails?: A Lot Help needed moving to and from a bed to a chair (including a wheelchair)?: A Lot Help needed  standing up from a chair using your arms (e.g., wheelchair or bedside chair)?: A Lot Help needed to walk in hospital room?: A Lot Help needed climbing 3-5 steps with a railing? : A Lot 6 Click Score: 12    End of Session Equipment Utilized During Treatment: Gait belt Activity Tolerance: Patient tolerated treatment well Patient left: in chair;with call bell/phone within reach;with chair alarm set;with family/visitor present Nurse Communication: Mobility status PT Visit Diagnosis: Other abnormalities of gait and mobility (R26.89);Muscle weakness (generalized) (M62.81)    Time: 8997-8961 PT Time Calculation (min) (ACUTE ONLY): 36 min   Charges:   PT Evaluation $PT Eval Moderate Complexity: 1 Mod PT Treatments $Therapeutic Activity: 8-22 mins PT General Charges $$ ACUTE PT VISIT: 1 Visit       Darice Almas, PT, DPT Acute Rehabilitation Services  Personal: Secure Chat Rehab Office: [phone]  Darice LITTIE Almas 07/01/2024, 1:40 PM

## 2024-07-01 NOTE — Plan of Care (Signed)

## 2024-07-01 NOTE — Progress Notes (Signed)
 Subjective: Chief Complaint  Patient presents with   Nail Problem    RFC. Not diabetic, Takes Plavix .      84 y.o. returns the office today for painful, elongated, thickened toenails which he cannot trim himself.    He still gets some occasional discomfort to his heels but no other areas of discomfort.  No open lesions.    He is on Plavix .  PCP: [name], DO Last seen 06/21/2024  Objective: NAD, presents with his wife DP/PT pulses palpable, CRT less than 3 seconds  Nails hypertrophic, dystrophic, elongated, brittle, discolored x 10.  Incurvation of the hallux toenails without any edema, erythema or signs of infection.  There is tenderness overlying the nails 1-5 bilaterally.  No swelling around the nails, redness or drainage or any signs of infection. There is mild dry skin present on the lateral aspect of the heels bilaterally but no ulcerations.  This has not worsened. Not able to appreciate any tenderness today. Hammertoes present No open lesions No pain with calf compression, swelling, warmth, erythema.  Assessment: Patient presents with symptomatic onychomycosis  Plan: -Treatment options including alternatives, risks, complications were discussed -Nails debrided x 10 without any complications or bleeding.   -Advised continued foot checks -Continue moisturizer to the heels daily.  Offloading.  Return in about 3 months (around 09/26/2024).   Donnice [name] Fees DPM

## 2024-07-01 NOTE — Progress Notes (Signed)
 Pt's wife requesting L palm guard. OT completed L hand PROM to all joints and applied palm guard. Good fit. Pt and pt's wife educated on purpose and wear schedule. They were very appreciative. OT POC remains the same; OT visit with no charge.     07/01/24 1400  OT Visit Information  Last OT Received On 07/01/24  Assistance Needed +1  History of Present Illness 84 y.o. male admitted 06/28/24 with fever, confusion. Head CT negative for acute injury. Workup for sepsis, PNA; concern for aspiration. PMH includes vascular dementia, Parkinson's disease, CVA (L-side weakness), CKD, bladder CA, arthritis, dysphagia, anemia.  Precautions  Precautions Fall;Other (comment)  Recall of Precautions/Restrictions Impaired  Precaution/Restrictions Comments h/o CVA with residual L side hemiparesis; bladder incontinence  Restrictions  Weight Bearing Restrictions Per Provider Order No  Pain Assessment  Pain Assessment Faces  Faces Pain Scale 2  Pain Location back  Pain Descriptors / Indicators Discomfort;Guarding;Grimacing  Pain Intervention(s) Limited activity within patient's tolerance;Monitored during session  Cognition  Arousal Alert  Behavior During Therapy Flat affect  Cognition History of cognitive impairments  Following Commands  Following commands Impaired  Following commands impaired Follows one step commands with increased time  Upper Extremity Assessment  Upper Extremity Assessment LUE deficits/detail  LUE Deficits / Details h/o CVA with L hemiparesis; increased wrist/finger flexor tone; pt using LUE as stabilizer sometimes - PROM to digits and wrist, gave palm guard  General Comments  General comments (skin integrity, edema, etc.) Gave pt L palm guard, educated pt and his wife on wear schedule  OT - End of Session  Activity Tolerance Patient limited by fatigue  Patient left in bed;with call bell/phone within reach;with nursing/sitter in room  Nurse Communication Mobility status  OT  Assessment/Plan  OT Visit Diagnosis Unsteadiness on feet (R26.81);Other abnormalities of gait and mobility (R26.89);Muscle weakness (generalized) (M62.81);Hemiplegia and hemiparesis;Other symptoms and signs involving cognitive function  Hemiplegia - Right/Left Right  Hemiplegia - dominant/non-dominant Non-Dominant  Hemiplegia - caused by Cerebral infarction  OT Frequency (ACUTE ONLY) Min 2X/week  Follow Up Recommendations Home health OT  Patient can return home with the following A lot of help with walking and/or transfers;A lot of help with bathing/dressing/bathroom;Assist for transportation;Help with stairs or ramp for entrance;Supervision due to cognitive status;Direct supervision/assist for medications management;Direct supervision/assist for financial management  OT Equipment None recommended by OT  AM-PAC OT 6 Clicks Daily Activity Outcome Measure (Version 2)  Help from another person eating meals? 3  Help from another person taking care of personal grooming? 3  Help from another person toileting, which includes using toliet, bedpan, or urinal? 2  Help from another person bathing (including washing, rinsing, drying)? 2  Help from another person to put on and taking off regular upper body clothing? 2  Help from another person to put on and taking off regular lower body clothing? 2  6 Click Score 14  Progressive Mobility  What is the highest level of mobility based on the mobility assessment? Level 4 (Ambulates with assistance) - Balance while stepping forward/back - Complete  Activity Moved bed into chair position  OT Goal Progression  Progress towards OT goals Progressing toward goals  Acute Rehab OT Goals  Patient Stated Goal to get palm guard  OT Goal Formulation With patient/family  Time For Goal Achievement 07/14/24  Potential to Achieve Goals Good  ADL Goals  Pt Will Perform Grooming with contact guard assist;standing  Pt Will Perform Lower Body Bathing with min assist;sit  to/from stand  Pt Will Perform Lower Body Dressing with mod assist;sit to/from stand  Pt Will Transfer to Toilet with min assist;ambulating;bedside commode  Pt Will Perform Toileting - Clothing Manipulation and hygiene with min assist;sit to/from stand  Pt Will Perform Tub/Shower Transfer Tub transfer;with contact guard assist;tub bench  OT Time Calculation  OT Start Time (ACUTE ONLY) 1338  OT Stop Time (ACUTE ONLY) 1345  OT Time Calculation (min) 7 min  OT General Charges  $OT Visit 1 Visit   Lucie Kendall, OTR/L Acute Rehabilitation Services Office [phone] Secure Chat Communication Preferred

## 2024-07-02 ENCOUNTER — Encounter (HOSPITAL_COMMUNITY): Payer: Self-pay | Admitting: Internal Medicine

## 2024-07-02 ENCOUNTER — Other Ambulatory Visit (HOSPITAL_BASED_OUTPATIENT_CLINIC_OR_DEPARTMENT_OTHER): Payer: Self-pay

## 2024-07-02 MED ORDER — AMOXICILLIN-POT CLAVULANATE 875-125 MG PO TABS
1.0000 | ORAL_TABLET | Freq: Two times a day (BID) | ORAL | 0 refills | Status: AC
  Filled 2024-07-02: qty 8, 4d supply, fill #0

## 2024-07-02 NOTE — Progress Notes (Signed)
 DISCHARGE NOTE HOME ASHIR KUNZ to be discharged Home per MD order. Discussed prescriptions and follow up appointments with the patient. Prescriptions given to patient; medication list explained in detail. Patient verbalized understanding.  Skin clean, dry and intact without evidence of skin break down, no evidence of skin tears noted. IV catheter discontinued intact. Site without signs and symptoms of complications. Dressing and pressure applied. Pt denies pain at the site currently. No complaints noted.  Patient free of lines, drains, and wounds.   An After Visit Summary (AVS) was printed and given to the patient. Patient escorted via wheelchair, and discharged home via private auto.  Peyton SHAUNNA Pepper, RN

## 2024-07-02 NOTE — Plan of Care (Signed)
" °  Problem: Education: Goal: Knowledge of General Education information will improve Description: Including pain rating scale, medication(s)/side effects and non-pharmacologic comfort measures Outcome: Progressing   Problem: Health Behavior/Discharge Planning: Goal: Ability to manage health-related needs will improve Outcome: Progressing   Problem: Clinical Measurements: Goal: Ability to maintain clinical measurements within normal limits will improve Outcome: Progressing Goal: Will remain free from infection Outcome: Progressing Goal: Diagnostic test results will improve Outcome: Progressing Goal: Respiratory complications will improve Outcome: Progressing Goal: Cardiovascular complication will be avoided Outcome: Progressing   Problem: Activity: Goal: Risk for activity intolerance will decrease Outcome: Progressing   Problem: Nutrition: Goal: Adequate nutrition will be maintained Outcome: Progressing   Problem: Coping: Goal: Level of anxiety will decrease Outcome: Progressing   Problem: Elimination: Goal: Will not experience complications related to bowel motility Outcome: Progressing   Problem: Pain Managment: Goal: General experience of comfort will improve and/or be controlled Outcome: Progressing   Problem: Safety: Goal: Ability to remain free from injury will improve Outcome: Progressing   Problem: Skin Integrity: Goal: Risk for impaired skin integrity will decrease Outcome: Progressing   Problem: Activity: Goal: Ability to tolerate increased activity will improve Outcome: Progressing   Problem: Respiratory: Goal: Ability to maintain adequate ventilation will improve Outcome: Progressing Goal: Ability to maintain a clear airway will improve Outcome: Progressing   "

## 2024-07-02 NOTE — Progress Notes (Signed)
 Speech Language Pathology Treatment: Dysphagia  Patient Details Name: William [name] MRN: [id number] DOB: 1940/09/10 Today's Date: 07/02/2024 Time: 8864-8840 SLP Time Calculation (min) (ACUTE ONLY): 24 min  Assessment / Plan / Recommendation Clinical Impression  William [name] was seen for a f/u dysphagia tx session with current diet of Dysphagia 1(puree)/pudding thick liquids.  Pt mentation improved and he was able to follow directives with mod verbal/tactile cues to initiate swallow and take small sips via cup/straw this session.  Discussed in depth with wife re: preparation of food/liquids at home/aspiration/swallowing precautions to follow during meals.  Recommendation for f/u OP MBS to re-assess swallow function and HH SLP to provide further dysphagia tx/education.  Also discussed liberalization of diet given aspiration risk with wife receptive to precautions/education re: dysphagia.  Pt's vocal quality remained clear during po intake (although limited) and one incidence of delayed cough during intake of pudding and honey-thickened liquids/dysphagia 1(puree) consistency.  Reported intermittent globus sensation by pt. Pt required mod verbal cues to initiate a small sip of honey-thickened liquids during self-feeding.  Pt benefitted from decreasing distractions during po intake to improve sustained attention during trial.  Recommend ST continue to f/u in acute setting for dysphagia tx/education.    HPI HPI: JESS TONEY is a 84 y.o. male who presented to the ED 1/15 via EMS for evaluation of fever and confusion. 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 MBS completed on 06/29/24 recommending Dysphagia 1(puree)/pudding thick liquids d/t severe dysphagia in setting of sepsis from PNA/hx of  Parkinson's/dysphagia.  ST f/u for dysphagia tx/education.      SLP Plan  Continue with current plan of care        Swallow Evaluation Recommendations   Recommendations: PO diet PO Diet Recommendation: Dysphagia 1 (Pureed);Extremely thick liquids (Level 4, pudding thick) Liquid Administration via: Spoon;Cup;Other (Comment) (small sips) Medication Administration: Crushed with puree Supervision: Staff to assist with self-feeding;Full supervision/cueing for swallowing strategies Postural changes: Position pt fully upright for meals;Stay upright 30-60 min after meals Oral care recommendations: Oral care BID (2x/day) Recommended consults: Consider Palliative care     Recommendations   Continue ST during acute stay; recommended HH SLP for dysphagia tx/education; f/u OP MBS to potentially progress diet                  Oral care BID   Frequent or constant Supervision/Assistance Dysphagia, oropharyngeal phase (R13.12)     Continue with current plan of care     Pat Mykale Gandolfo,M.S.,CCC-SLP  07/02/2024, 12:30 PM

## 2024-07-02 NOTE — Care Management Important Message (Signed)
 Important Message  Patient Details  Name: William [name] MRN: [id number] Date of Birth: 10/26/1940   Important Message Given:  Yes - Medicare IM     Claretta Deed 07/02/2024, 3:36 PM

## 2024-07-02 NOTE — Discharge Instructions (Addendum)
 Patient needs a Pureed or pudding thick diet.  Fluids also need to be thickened per Dietician request.

## 2024-07-02 NOTE — Progress Notes (Signed)
 Nutrition Consult - Brief Note  Received consult to review diet with patient's wife. Met with patient and wife at bedside. We discussed dysphagia 1 pureed diet and pudding thick liquids. She plans to use her blender to puree his foods. She also has some Thick It thickener for beverages. Discussed ways to increase protein and calorie intake while following this diet. Provided handout from the [employer] on Level 4 pureed diet. Patient's wife was very appreciative of information provided. Plans for discharge home today. No further nutrition interventions indicated at this time.   Suzen HUNT RD, LDN, CNSC Contact via secure chat. If unavailable, use group chat RD Inpatient.

## 2024-07-02 NOTE — Discharge Summary (Signed)
 Physician Discharge Summary  Patient ID: William [name] MRN: [id number] DOB/AGE: 82-13-1942 82 y.o.  Admit date: 06/28/2024 Discharge date: 07/02/2024  Admission Diagnoses:  Discharge Diagnoses:  Principal Problem:   Pneumonia Active Problems:   Chest pain   Acute metabolic encephalopathy   Parkinson's disease (HCC)   Sepsis (HCC)   CAP (community acquired pneumonia)   Discharged Condition: {condition:18240}  Hospital Course: ***  Consults: {consultation:18241}  Significant Diagnostic Studies: {diagnostics:18242}  Treatments: {Tx:18249}  Discharge Exam: Blood pressure 138/64, pulse 67, temperature 98.1 F (36.7 C), temperature source Oral, resp. rate 17, height 5' 10 (1.778 m), weight 62.5 kg, SpO2 100%. {physical zkjf:6958869}  Disposition: Discharge disposition: 06-Home-Health Care Svc       Discharge Instructions     Increase activity slowly   Complete by: As directed       Allergies as of 07/02/2024       Reactions   Bee Venom Anaphylaxis   Strawberry Extract Hives   Latex Itching   Zetia  [ezetimibe ] Other (See Comments)   myalgia   Adhesive [tape] Other (See Comments)   blisters   Statins Other (See Comments)   myalgias        Medication List     STOP taking these medications    amoxicillin  500 MG capsule Commonly known as: AMOXIL    bacitracin  ointment   clobetasol  cream 0.05 % Commonly known as: TEMOVATE    EPINEPHrine  0.3 mg/0.3 mL Soaj injection Commonly known as: EpiPen  2-Pak   fluticasone  0.05 % cream Commonly known as: CUTIVATE    glycerin  adult 2 g suppository   NONFORMULARY OR COMPOUNDED ITEM   triamcinolone  cream 0.1 % Commonly known as: KENALOG        TAKE these medications    carbidopa -levodopa  25-100 MG tablet Commonly known as: SINEMET  IR Take 2 tablets by mouth 3 (three) times daily. The timing of this medication is very important.   acetaminophen  325 MG tablet Commonly known as: TYLENOL  Take 2  tablets (650 mg total) by mouth every 6 (six) hours as needed for mild pain.   Adult One Daily Gummies Chew Chew 1 tablet by mouth every morning.   amLODipine  10 MG tablet Commonly known as: NORVASC  Take 1 tablet (10 mg total) by mouth every morning.   amoxicillin -clavulanate 875-125 MG tablet Commonly known as: AUGMENTIN  Take 1 tablet by mouth 2 (two) times daily for 4 days.   B-12 1000 MCG Tabs Take 1 tablet (1,000 mcg total) by mouth daily.   carBAMazepine  100 MG/5ML suspension Commonly known as: TEGretol  Take 5 mLs (100 mg total) by mouth in the morning AND 5 mLs (100 mg total) daily at 2 PM AND 10 mLs (200 mg total) at bedtime.   clopidogrel  75 MG tablet Commonly known as: PLAVIX  Take 1 tablet (75 mg total) by mouth daily.   ferrous sulfate  325 (65 FE) MG EC tablet Take 2 tablets by mouth at bedtime. *Crushed*   hydrALAZINE  25 MG tablet Commonly known as: APRESOLINE  Take 1 tablet (25 mg total) by mouth 3 (three) times daily.   LORazepam  1 MG tablet Commonly known as: ATIVAN  Take 1 tablet (1 mg total) by mouth 3 (three) times daily. Take in the morning, at 3pm, and at bedtime What changed:  how much to take when to take this additional instructions   pantoprazole  40 MG tablet Commonly known as: PROTONIX  Take 1 tablet (40 mg total) by mouth daily.   polyethylene glycol 17 g packet Commonly known as: MIRALAX  / GLYCOLAX  Take 17  g by mouth daily.   tamsulosin  0.4 MG Caps capsule Commonly known as: FLOMAX  Take 1 capsule (0.4 mg total) by mouth daily after supper. What changed:  when to take this Another medication with the same name was removed. Continue taking this medication, and follow the directions you see here.   traZODone  150 MG tablet Commonly known as: DESYREL  Take 0.5 tablets (75 mg total) by mouth at bedtime. *Crushed*   Vitamin D3 50 MCG (2000 UT) Chew Chew 1 tablet by mouth daily.        Contact information for follow-up providers      [facility] up.   Why: [name] will provide Outpatient Palliative Care. Contact information: [address] [place] [address] [phone]              Contact information for after-discharge care     Home Medical Care     [facility]) .   Service: Home Health Services Contact information: [address] [place] [address] [place]  [address] [phone]                     Signed: Leatrice LILLETTE Chapel 07/02/2024, 3:05 PM

## 2024-07-02 NOTE — Progress Notes (Signed)
 Occupational Therapy Treatment Patient Details Name: William [name] MRN: [id number] DOB: 1940-06-27 Today's Date: 07/02/2024   History of present illness 84 y.o. male admitted 06/28/24 with fever, confusion. Head CT negative for acute injury. Workup for sepsis, PNA; concern for aspiration. PMH includes vascular dementia, Parkinson's disease, CVA (L-side weakness), CKD, bladder CA, arthritis, dysphagia, anemia.   OT comments  Patient received in supine and agreeable to OT treatment. Patient able to get to EOB with verbal cues and mod assist. Patient was mod assist to stand from EOB with assistance on right side and min assist to ambulate to bathroom. Patient required mod assist for clothing management and toilet hygiene. Patient ambulated to sink for hand hygiene and urinated on floor. LB bathing and socks change performed to address. Patient in recliner at end of session and lunch setup provided. Patient's wife states she has been assisting with meals due to diet change.  Discharge recommendations continue to be appropriate for home with wife to assist and HHOT to follow.  Acute OT to continue to follow to address established goals.       If plan is discharge home, recommend the following:  A lot of help with walking and/or transfers;A lot of help with bathing/dressing/bathroom;Assist for transportation;Help with stairs or ramp for entrance;Supervision due to cognitive status;Direct supervision/assist for medications management;Direct supervision/assist for financial management   Equipment Recommendations  None recommended by OT    Recommendations for Other Services      Precautions / Restrictions Precautions Precautions: Fall;Other (comment) Recall of Precautions/Restrictions: Impaired Precaution/Restrictions Comments: h/o CVA with residual L side hemiparesis; bladder incontinence Restrictions Weight Bearing Restrictions Per Provider Order: No       Mobility Bed Mobility Overal bed  mobility: Needs Assistance Bed Mobility: Supine to Sit     Supine to sit: Mod assist, HOB elevated     General bed mobility comments: assistance with trunk and scooting towards EOB    Transfers Overall transfer level: Needs assistance Equipment used: None, 1 person hand held assist Transfers: Sit to/from Stand Sit to Stand: Mod assist           General transfer comment: mod assist to stand from EOB and chair and min assist HHA for transfers     Balance Overall balance assessment: Needs assistance Sitting-balance support: No upper extremity supported Sitting balance-Leahy Scale: Fair Sitting balance - Comments: EOB   Standing balance support: During functional activity, Single extremity supported Standing balance-Leahy Scale: Poor Standing balance comment: reliant on external support                           ADL either performed or assessed with clinical judgement   ADL Overall ADL's : Needs assistance/impaired Eating/Feeding: Set up;Sitting Eating/Feeding Details (indicate cue type and reason): wife states she has been assisting with feeding due to diet change Grooming: Wash/dry hands;Wash/dry face;Standing;Contact guard assist Grooming Details (indicate cue type and reason): CGA while standing     Lower Body Bathing: Moderate assistance;Sit to/from stand       Lower Body Dressing: Maximal assistance;Sit to/from stand Lower Body Dressing Details (indicate cue type and reason): to change socks Toilet Transfer: Minimal assistance;Ambulation;Regular Toilet;BSC/3in1 Statistician Details (indicate cue type and reason): ambulated to bathroom with 3n1 over commode with HHA min assist Toileting- Clothing Manipulation and Hygiene: Maximal assistance;Sitting/lateral lean Toileting - Clothing Manipulation Details (indicate cue type and reason): max assist for clothing management and hygiene  Extremity/Trunk Assessment              Teacher, Adult Education Communication: Impaired Factors Affecting Communication: Reduced clarity of speech   Cognition Arousal: Alert Behavior During Therapy: Flat affect Cognition: History of cognitive impairments                               Following commands: Impaired Following commands impaired: Follows one step commands with increased time      Cueing   Cueing Techniques: Verbal cues, Tactile cues  Exercises      Shoulder Instructions       General Comments Patient's wife present and supportive    Pertinent Vitals/ Pain       Pain Assessment Pain Assessment: Faces Faces Pain Scale: Hurts a little bit Pain Location: back Pain Descriptors / Indicators: Discomfort, Grimacing Pain Intervention(s): Limited activity within patient's tolerance, Monitored during session, Repositioned  Home Living                                          Prior Functioning/Environment              Frequency  Min 2X/week        Progress Toward Goals  OT Goals(current goals can now be found in the care plan section)  Progress towards OT goals: Progressing toward goals  Acute Rehab OT Goals Patient Stated Goal: to go home OT Goal Formulation: With patient/family Time For Goal Achievement: 07/14/24 Potential to Achieve Goals: Good ADL Goals Pt Will Perform Grooming: with contact guard assist;standing Pt Will Perform Lower Body Bathing: with min assist;sit to/from stand Pt Will Perform Lower Body Dressing: with mod assist;sit to/from stand Pt Will Transfer to Toilet: with min assist;ambulating;bedside commode Pt Will Perform Toileting - Clothing Manipulation and hygiene: with min assist;sit to/from stand Pt Will Perform Tub/Shower Transfer: Tub transfer;with contact guard assist;tub bench  Plan      Co-evaluation                 AM-PAC OT 6 Clicks Daily Activity     Outcome Measure   Help  from another person eating meals?: A Little Help from another person taking care of personal grooming?: A Little Help from another person toileting, which includes using toliet, bedpan, or urinal?: A Lot Help from another person bathing (including washing, rinsing, drying)?: A Lot Help from another person to put on and taking off regular upper body clothing?: A Lot Help from another person to put on and taking off regular lower body clothing?: A Lot 6 Click Score: 14    End of Session Equipment Utilized During Treatment: Gait belt  OT Visit Diagnosis: Unsteadiness on feet (R26.81);Other abnormalities of gait and mobility (R26.89);Muscle weakness (generalized) (M62.81);Hemiplegia and hemiparesis;Other symptoms and signs involving cognitive function Hemiplegia - Right/Left: Left Hemiplegia - dominant/non-dominant: Non-Dominant Hemiplegia - caused by: Cerebral infarction   Activity Tolerance Patient tolerated treatment well   Patient Left in chair;with call bell/phone within reach;with chair alarm set   Nurse Communication Mobility status        Time: 8792-8755 OT Time Calculation (min): 37 min  Charges: OT General Charges $OT Visit: 1 Visit OT Treatments $Self Care/Home Management : 23-37 mins  Dick Laine,  OTA Acute Rehabilitation Services  Office [phone]   Jeb LITTIE Laine 07/02/2024, 2:22 PM

## 2024-07-02 NOTE — Progress Notes (Signed)
 "                                                                                                                                                                                                          Daily Progress Note   Patient Name: William [name]       Date: 07/02/2024 DOB: 1940/11/12  Age: 84 y.o. MRN#: [id number] Attending Physician: Rosario Leatrice FERNS, MD Primary Care Physician: Antonio Meth, Jamee SAUNDERS, DO Admit Date: 06/28/2024  Reason for Consultation/Follow-up: Establishing goals of care  Subjective:  Patient is out of bed to the chair.  He appears comfortable.  Wife at bedside.  Patient participates in conversations with simple yes and no, thumbs up thumbs down responses.  Ongoing discussion regarding current medical situation.  Education offered on the natural trajectory of Parkinson's disease as it relates to weakness and dysphagia.  We discussed in detail strategies to enhance pured diet.  Education offered on the importance of continued conversation within the family with the medical providers regarding overall plan of care and treatment options, ensuring decisions are within the context of patient's values and goals of care.   Length of Stay: 3  Current Medications: Scheduled Meds:   amLODipine   10 mg Oral q morning   carBAMazepine   100 mg Oral q AM   And   carBAMazepine   100 mg Oral Q1400   And   carBAMazepine   200 mg Oral QHS   carbidopa -levodopa   2 tablet Oral TID   clopidogrel   75 mg Oral Daily   enoxaparin  (LOVENOX ) injection  30 mg Subcutaneous Q24H   hydrALAZINE   25 mg Oral TID   LORazepam   0.25 mg Oral q morning   And   LORazepam   1 mg Oral Q1400   And   LORazepam   1 mg Oral QHS   multivitamin with minerals  1 tablet Oral Daily   pantoprazole  (PROTONIX ) IV  40 mg Intravenous Daily   polyethylene glycol  17 g Oral Daily   tamsulosin   0.4 mg Oral QHS   traZODone   75 mg Oral QHS    Continuous Infusions:  ampicillin -sulbactam (UNASYN ) IV 3 g  (07/02/24 0405)    PRN Meds: acetaminophen  **OR** acetaminophen , bisacodyl   Physical Exam HENT:     Mouth/Throat:     Mouth: Mucous membranes are moist.  Cardiovascular:     Rate and Rhythm: Normal rate.  Pulmonary:     Effort: Pulmonary effort is normal.  Musculoskeletal:     Comments: Generalized weakness and muscle atrophy  Skin:    General:  Skin is warm and dry.  Neurological:     Mental Status: He is alert.             Vital Signs: BP 138/64 (BP Location: Left Arm)   Pulse 67   Temp 98.1 F (36.7 C) (Oral)   Resp 17   Ht 5' 10 (1.778 m)   Wt 62.5 kg   SpO2 100%   BMI 19.77 kg/m  SpO2: SpO2: 100 % O2 Device: O2 Device: Room Air O2 Flow Rate:    Intake/output summary: No intake or output data in the 24 hours ending 07/02/24 1308 LBM: Last BM Date : 06/28/24 Baseline Weight: Weight: 62.5 kg Most recent weight: Weight: 62.5 kg       Palliative Assessment/Data: 40%      Patient Active Problem List   Diagnosis Date Noted   Pneumonia 06/29/2024   Sepsis (HCC) 06/29/2024   CAP (community acquired pneumonia) 06/29/2024   Hx of constipation 05/08/2024   Bronchitis 06/16/2023   Impacted cerumen of left ear 06/16/2023   Acute encephalopathy 04/18/2023   Parkinson's disease (HCC) 04/18/2023   History of CVA with residual deficit 04/18/2023   Chronic kidney disease, stage 3b (HCC) 04/18/2023   Dementia with behavioral disturbance (HCC) 04/18/2023   Anemia of chronic disease 04/18/2023   Laceration of right upper extremity 02/24/2023   Dermatitis 02/24/2023   Lung nodules 02/24/2023   Dysuria 08/03/2022   Nonintractable headache 07/01/2022   Bilateral impacted cerumen 06/24/2022   Rash 06/15/2022   Postoperative ileus (HCC) 02/27/2022   Ileus, postoperative (HCC) 02/26/2022   Choledocholithiasis 02/19/2022   DNR (do not resuscitate) 02/19/2022   Left elbow pain 01/26/2022   Mid back pain on left side 01/26/2022   Rib pain 01/26/2022   Acute pain of  left shoulder 11/12/2021   Leukocytes in urine 11/12/2021   Urinary frequency 11/12/2021   Thrush 10/08/2021   Hemiplegia, 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   Insomnia    Prediabetes    Acute 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 (HCC)    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   Transaminitis 07/27/2021   UTI (urinary tract infection) 07/27/2021   CVA (cerebral vascular accident) (HCC) 07/27/2021   Fall 07/27/2021   Hyperglycemia 07/27/2021   Cholelithiasis 07/27/2021   Hypoxia 07/27/2021   Nausea and vomiting 07/27/2021   Acute metabolic encephalopathy 07/27/2021   Normocytic anemia 07/27/2021   Chronic back pain 07/27/2021   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   Closed fracture of first lumbar vertebra with routine healing 02/03/2021   Closed fracture of multiple ribs 11/18/2020   Anxiety 01/29/2020   Leg pain, bilateral 01/29/2020   Ingrown toenail 07/13/2019   Lumbar spondylosis 05/02/2018   Pain in left knee 03/09/2018   Osteoarthritis of left hip 01/16/2018   Trochanteric bursitis of left hip 01/16/2018   Preventative health care 09/26/2017   HTN (hypertension) 07/19/2015   Hyperlipidemia 07/19/2015   Great toe pain 02/11/2014   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   Obesity (BMI 30-39.9) 06/25/2013   Renal insufficiency 06/25/2013   Weakness of left arm 06/25/2013   Sebaceous cyst 03/03/2011   Sprain of lumbar region 07/31/2010   Chest pain 08/29/2009   Carotid artery stenosis, asymptomatic, bilateral 05/02/2009   Eczema, atopic 05/31/2008   Vitamin D  deficiency 03/01/2008   BPH (benign prostatic hyperplasia) 08/06/2007   Fasting hyperglycemia 12/21/2006   History of right MCA infarct 2006  Palliative Care Assessment & Plan   Patient Profile:  84 year  old male with past medical history significant for hypertension, hyperlipidemia, CVA, Parkinson's  disease, dysphagia, BPH, CKD, GERD chronic anemia, CVA admitted through the emergency room for evaluation of fever and confusion.  Admitted for treatment stabilization secondary to pneumonia, and acute metabolic encephalopathy.  Patient and family will face ongoing decisions regarding treatment options, advanced directives and anticipatory care needs within the context of serious life limiting illness/Parkinson's disease   Recommendations/Plan: DNR DNI-Limited AD-no feeding tube consideration Pured diet as recommended by speech therapy Placed order for dietitian for education to wife regarding more information on pured diet and preparation       (Education offered today regarding pured diet.  Suggestion and tips for preparation.             Discussed the importance of high caloric food items and importance of hydrating foods) Discharge home-   wife's main care giver.  They are anticipating discharge within the next 24 to 48 hours Outpatient palliative services  Goals of Care and Additional Recommendations: Limitations on Scope of Treatment: Avoid Hospitalization  Code Status:    Code Status Orders  (From admission, onward)           Start     Ordered   06/29/24 0309  Do not attempt resuscitation (DNR)- Limited -Do Not Intubate (DNI)  Continuous       Question Answer Comment  If pulseless and not breathing No CPR or chest compressions.   In Pre-Arrest Conditions (Patient Is Breathing and Has A Pulse) Do not intubate. Provide all appropriate non-invasive medical interventions. Avoid ICU transfer unless indicated or required.   Consent: Discussion documented in EHR or advanced directives reviewed      06/29/24 0310           Code Status History     Date Active Date Inactive Code Status Order ID Comments User Context   04/18/2023 1552 04/19/2023 2209 Do not attempt resuscitation (DNR) PRE-ARREST INTERVENTIONS DESIRED [id number]  Claudene Maximino LABOR, MD ED   02/26/2022 2140  03/12/2022 2249 DNR [id number]  Leopold Damien NOVAK, MD ED   02/19/2022 1817 02/25/2022 1739 DNR [id number]  Barbarann Nest, MD ED   07/29/2021 1537 08/18/2021 1458 Full Code [id number]  Rosendo Nena PARAS, PA-C Inpatient   07/29/2021 1537 07/29/2021 1537 Full Code [id number]  Rosendo Nena PARAS, PA-C Inpatient   07/27/2021 0801 07/29/2021 1534 Full Code [id number]  Claudene Maximino LABOR, MD ED   06/22/2021 1056 06/23/2021 1632 Full Code [id number]  [name], MD Inpatient      Advance Directive Documentation    Flowsheet Row Most Recent Value  Type of Advance Directive Living will  Pre-existing out of facility DNR order (yellow form or pink MOST form) --  MOST Form in Place? --    Prognosis:  Unable to determine  Discharge Planning: Home with Palliative Services  50 minutes-----Detailed review of medical records ( labs, imaging, vital signs), medically appropriate exam ( MS, skin, cardiac,  resp)   discussed with treatment team, counseling and education to patient, family, staff, documenting clinical information, medication management, coordination of care    Thank you for allowing the Palliative Medicine Team to assist in the care of this patient.   Ronal Plants, NP  Please contact Palliative Medicine Team phone at [phone] for questions and concerns.       "

## 2024-07-02 NOTE — Progress Notes (Signed)
 Transition of Care [facility]) - Inpatient Brief Assessment   Patient Details  Name: William [name] MRN: [id number] Date of Birth: 08/22/1940  Transition of Care [facility]) CM/SW Contact:    Rosaline [name] Joe, RN Phone Number: 07/02/2024, 1:02 PM   Clinical Narrative: CM met with the patient's spouse at the bedside to discuss IP Care management needs.  The patient lives at home with spouse and will return when stable.  DME at the home includes RW, [place], Lift chair and shower bench.  Patient's spouse was offered Medicare choice regarding home health and wife chose [facility].  HH orders placed for [facility] PT, OT, RN and ST - to be co-signed by MD.  [facility] plans to provide [facility] RN and [facility] RN will assist with  patient's LTC placement in the future when his wife has TKA surgery in the future.  Registered dietitian will follow up with the wife regarding dietary changes when he goes home - dietitian to meet with the wife this afternoon.   Transition of Care Asessment: Insurance and Status: (P) Insurance coverage has been reviewed Patient has primary care physician: (P) Yes Home environment has been reviewed: (P) from home with spouse Prior level of function:: (P) family care Prior/Current Home Services: (P) No current home services Social Drivers of Health Review: (P) SDOH reviewed needs interventions Readmission risk has been reviewed: (P) Yes Transition of care needs: (P) transition of care needs identified, TOC will continue to follow

## 2024-07-03 ENCOUNTER — Telehealth: Payer: Self-pay

## 2024-07-03 LAB — CULTURE, BLOOD (ROUTINE X 2)
Culture: NO GROWTH
Culture: NO GROWTH

## 2024-07-03 LAB — LEGIONELLA PNEUMOPHILA SEROGP 1 UR AG: L. pneumophila Serogp 1 Ur Ag: NEGATIVE

## 2024-07-03 NOTE — Patient Instructions (Signed)
 Visit Information  Thank you for taking time to visit with me today. Please don't hesitate to contact me if I can be of assistance to you before our next scheduled telephone appointment.  Our next appointment is by telephone on 07/11/24 late am  Following is a copy of your care plan:   Goals Addressed             This Visit's Progress    VBCI Transitions of Care (TOC) Care Plan       Problems:  Recent Hospitalization for treatment of Pneumonia Diet/Nutrition/Food Resources patient discharge home on Dysphagia 1 pureed diet and pudding thick liquids - wife states her son ordered Thick It that was shipped overnight.  Wife expressed needing to end call prior to assessments being completed and states she needs to care for patient. Wife agrees to enroll and follow up next week after PCP appt and will decide if she wants to continue with TOC calls or if having the other 2 services is too overwhelming and takes too much time away from patient.   Goal:  Over the next 30 days, the patient will not experience hospital readmission  Interventions:  Transitions of Care: Doctor Visits  - discussed the importance of doctor visits Arranged PCP follow-up within 7 days - scheduled by [facility] RN for 07/11/24 - reviewed taking dc summary and reviewing med list with PCP as wife states creams were discontinued that she still applies to patient when it's needed Contacted [facility] @ [phone] - spoke with Hargis who advised they did not receive referral from hospital and will contact hospital this morning and will contact wife to let her know when they will be out Review SDOH - wife states currently they are okay but states she is in need of knee surgery and has been putting it off due to caring for patient - wife states they have caregivers who come Tues/Thurs for 3 hours each day paid for by [employer] - discussed contacting agency to see if they can request additional hours and follow up after PCP appt and  let TOC RN know if wife feels they would benefit from SW referral - discussed [facility] likely has SW as all - Patient also referred to [name] for palliative care who also may have SW    Patient Self Care Activities:  Attend all scheduled provider appointments Call pharmacy for medication refills 3-7 days in advance of running out of medications Call provider office for new concerns or questions  Notify RN Care Manager of TOC call rescheduling needs Participate in Transition of Care Program/Attend TOC scheduled calls Take medications as prescribed    Plan:  Telephone follow up appointment with care management team member scheduled for:  07/11/24 late am The patient has been provided with contact information for the care management team and has been advised to call with any health related questions or concerns.         Patient verbalizes understanding of instructions and care plan provided today and agrees to view in MyChart. Active MyChart status and patient understanding of how to access instructions and care plan via MyChart confirmed with patient.     Telephone follow up appointment with care management team member scheduled for:06/15/24 The patient has been provided with contact information for the care management team and has been advised to call with any health related questions or concerns.   Please call the care guide team at [phone] if you need to cancel  or reschedule your appointment.   Please call the Suicide and Crisis Lifeline: 988 call 1-800-273-TALK (toll free, 24 hour hotline) call 911 if you are experiencing a Mental Health or Behavioral Health Crisis or need someone to talk to.  [name] Prow RN, CCM [place]  VBCI-Population Health RN Care Manager [phone]

## 2024-07-03 NOTE — Transitions of Care (Post Inpatient/ED Visit) (Signed)
 "  07/03/2024  Name: William [name] MRN: [id number] DOB: Feb 18, 1941  Today's TOC FU Call Status: Today's TOC FU Call Status:: Successful TOC FU Call Completed TOC FU Call Complete Date: 07/03/24  Patient's Name and Date of Birth confirmed. Name, DOB  Transition Care Management Follow-up Telephone Call Date of Discharge: 07/02/24 Discharge Facility: [name]) Type of Discharge: Inpatient Admission Primary Inpatient Discharge Diagnosis:: Pneumonia How have you been since you were released from the hospital?: Better Any questions or concerns?: No  Items Reviewed: Did you receive and understand the discharge instructions provided?: Yes Medications obtained,verified, and reconciled?: Yes (Medications Reviewed) Any new allergies since your discharge?: No Dietary orders reviewed?: Yes Type of Diet Ordered:: dysphagia 1 pureed diet and pudding thick liquids Do you have support at home?: Yes People in Home [RPT]: spouse Name of Support/Comfort Primary Source: Son helps as needed and grandson lives in area  Medications Reviewed Today: Medications Reviewed Today     Reviewed by Lauro [name] LABOR, RN (Registered Nurse) on 07/03/24 at 1014  Med List Status: <None>   Medication Order Taking? Sig Documenting Provider Last Dose Status Informant  acetaminophen  (TYLENOL ) 325 MG tablet [id number] Yes Take 2 tablets (650 mg total) by mouth every 6 (six) hours as needed for mild pain. Rosalba Glendale DEL, PA-C  Active Spouse/Significant Other, Pharmacy Records  amLODipine  (NORVASC ) 10 MG tablet [id number] Yes Take 1 tablet (10 mg total) by mouth every morning. [name], DO  Active Spouse/Significant Other, Pharmacy Records  amoxicillin -clavulanate (AUGMENTIN ) 875-125 MG tablet [id number] Yes Take 1 tablet by mouth 2 (two) times daily for 4 days. Rosario Leatrice FERNS, MD  Active   carBAMazepine  (TEGRETOL ) 100 MG/5ML suspension [id number] Yes Take 5 mLs (100 mg total) by mouth in the morning  AND 5 mLs (100 mg total) daily at 2 PM AND 10 mLs (200 mg total) at bedtime. Tasia Lung, MD  Active Spouse/Significant Other, Pharmacy Records  carbidopa -levodopa  (SINEMET  IR) 25-100 MG tablet [id number] Yes Take 2 tablets by mouth 3 (three) times daily. Skeet Juliene [name], DO  Active Spouse/Significant Other, Pharmacy Records           Med Note (WHITE, DONETA RAMAN   Fri Jun 29, 2024  2:00 AM) 0730, 1200, 1700  Cholecalciferol  (VITAMIN D3) 50 MCG (2000 UT) CHEW [id number] Yes Chew 1 tablet by mouth daily. [provider]  Active Spouse/Significant Other, Pharmacy Records           Med Note IDAMAE ALFREIDA LITTIE Pablo Apr 18, 2023  2:56 PM)    clopidogrel  (PLAVIX ) 75 MG tablet [id number] Yes Take 1 tablet (75 mg total) by mouth daily. [name], DO  Active Spouse/Significant Other, Pharmacy Records  cyanocobalamin  (VITAMIN B12) 1000 MCG tablet [id number] Yes Take 1 tablet (1,000 mcg total) by mouth daily. [name], DO  Active Spouse/Significant Other, Pharmacy Records  ferrous sulfate  325 (65 FE) MG EC tablet [id number] Yes Take 2 tablets by mouth at bedtime. *Crushed* [provider]  Active Spouse/Significant Other, Pharmacy Records           Med Note IDAMAE ALFREIDA LITTIE Pablo Apr 18, 2023  2:25 PM)    gemcitabine  (GEMZAR ) chemo syringe for bladder instillation 2,000 mg [id number]   [name], MD  Active   hydrALAZINE  (APRESOLINE ) 25 MG tablet [id number] Yes Take 1 tablet (25 mg total) by mouth 3 (three) times daily. Lowne Chase, Yvonne R, DO  Active Spouse/Significant Other,  Pharmacy Records  LORazepam  (ATIVAN ) 1 MG tablet [id number] Yes Take 1 tablet (1 mg total) by mouth 3 (three) times daily. Take in the morning, at 3pm, and at bedtime  Patient taking differently: Take 0.25-1 mg by mouth See admin instructions. Take 0.25mg  by mouth in the morning, 1 mg in the afternoon and at night   Tasia Lung, MD  Active Spouse/Significant Other, Pharmacy  Records  Multiple Vitamins-Minerals (ADULT ONE DAILY GUMMIES) CHEW [id number] Yes Chew 1 tablet by mouth every morning. [provider]  Active Spouse/Significant Other, Pharmacy Records  pantoprazole  (PROTONIX ) 40 MG tablet [id number] Yes Take 1 tablet (40 mg total) by mouth daily. [name], DO  Active Spouse/Significant Other, Pharmacy Records  polyethylene glycol (MIRALAX  / GLYCOLAX ) 17 g packet [id number] Yes Take 17 g by mouth daily. Freddi Hamilton, MD  Active Spouse/Significant Other, Pharmacy Records           Med Note IDAMAE ALFREIDA LITTIE Pablo Apr 18, 2023  2:26 PM)    tamsulosin  (FLOMAX ) 0.4 MG CAPS capsule [id number] Yes Take 1 capsule (0.4 mg total) by mouth daily after supper.  Patient taking differently: Take 0.4 mg by mouth at bedtime.   [name], DO  Active Spouse/Significant Other, Pharmacy Records  traZODone  (DESYREL ) 150 MG tablet [id number] Yes Take 0.5 tablets (75 mg total) by mouth at bedtime. *Crushed* Plovsky, Lung, MD  Active Spouse/Significant Other, Pharmacy Records            Home Care and Equipment/Supplies: Were Home Health Services Ordered?: Yes Name of Home Health Agency:: [facility] Has Agency set up a time to come to your home?: No (conference call with wife to [facility] to find out about first visit - spoke with Hargis -) Any new equipment or medical supplies ordered?: No  Functional Questionnaire: Do you need assistance with bathing/showering or dressing?: Yes (requires assistance from CGs) Do you need assistance with meal preparation?: Yes Do you need assistance with eating?: Yes Do you have difficulty maintaining continence: Yes Do you need assistance with getting out of bed/getting out of a chair/moving?: Yes Do you have difficulty managing or taking your medications?: Yes  Follow up appointments reviewed: PCP Follow-up appointment confirmed?: Yes Date of PCP follow-up appointment?:  07/10/24 Follow-up Provider: PCP Specialist Hospital Follow-up appointment confirmed?: NA Do you need transportation to your follow-up appointment?: No Do you understand care options if your condition(s) worsen?: Yes-patient verbalized understanding  SDOH Interventions Today    Flowsheet Row Most Recent Value  SDOH Interventions   Food Insecurity Interventions Intervention Not Indicated  Housing Interventions Intervention Not Indicated  Transportation Interventions Intervention Not Indicated  Utilities Interventions Intervention Not Indicated    Goals Addressed             This Visit's Progress    VBCI Transitions of Care (TOC) Care Plan       Problems:  Recent Hospitalization for treatment of Pneumonia Diet/Nutrition/Food Resources patient discharge home on Dysphagia 1 pureed diet and pudding thick liquids - wife states her son ordered Thick It that was shipped overnight.  Wife expressed needing to end call prior to assessments being completed and states she needs to care for patient. Wife agrees to enroll and follow up next week after PCP appt and will decide if she wants to continue with TOC calls or if having the other 2 services is too overwhelming and takes too much time away from patient.   Goal:  Over  the next 30 days, the patient will not experience hospital readmission  Interventions:  Transitions of Care: Doctor Visits  - discussed the importance of doctor visits Arranged PCP follow-up within 7 days - scheduled by [facility] RN for 07/11/24 - reviewed taking dc summary and reviewing med list with PCP as wife states creams were discontinued that she still applies to patient when it's needed Contacted [facility] @ [phone] - spoke with Hargis who advised they did not receive referral from hospital and will contact hospital this morning and will contact wife to let her know when they will be out Review SDOH - wife states currently they are okay but states she is in  need of knee surgery and has been putting it off due to caring for patient - wife states they have caregivers who come Tues/Thurs for 3 hours each day paid for by [employer] - discussed contacting agency to see if they can request additional hours and follow up after PCP appt and let TOC RN know if wife feels they would benefit from SW referral - discussed [facility] has SW as all - Patient also referred to [name] for palliative care who also may have SW    Patient Self Care Activities:  Attend all scheduled provider appointments Call pharmacy for medication refills 3-7 days in advance of running out of medications Call provider office for new concerns or questions  Notify RN Care Manager of TOC call rescheduling needs Participate in Transition of Care Program/Attend TOC scheduled calls Take medications as prescribed    Plan:  Telephone follow up appointment with care management team member scheduled for:  07/11/24 late am The patient has been provided with contact information for the care management team and has been advised to call with any health related questions or concerns.          [name] Prow RN, CCM [place]  VBCI-Population Health RN Care Manager [phone]  "

## 2024-07-04 ENCOUNTER — Other Ambulatory Visit: Payer: Self-pay | Admitting: Family Medicine

## 2024-07-04 ENCOUNTER — Other Ambulatory Visit: Payer: Self-pay

## 2024-07-05 ENCOUNTER — Other Ambulatory Visit (HOSPITAL_BASED_OUTPATIENT_CLINIC_OR_DEPARTMENT_OTHER): Payer: Self-pay

## 2024-07-05 ENCOUNTER — Ambulatory Visit: Admitting: Physical Therapy

## 2024-07-05 MED ORDER — TAMSULOSIN HCL 0.4 MG PO CAPS
0.4000 mg | ORAL_CAPSULE | Freq: Every day | ORAL | 1 refills | Status: AC
  Filled 2024-07-05: qty 90, 90d supply, fill #0

## 2024-07-05 MED ORDER — AMLODIPINE BESYLATE 10 MG PO TABS
10.0000 mg | ORAL_TABLET | Freq: Every morning | ORAL | 1 refills | Status: AC
  Filled 2024-07-05: qty 90, 90d supply, fill #0

## 2024-07-06 ENCOUNTER — Other Ambulatory Visit (HOSPITAL_BASED_OUTPATIENT_CLINIC_OR_DEPARTMENT_OTHER): Payer: Self-pay

## 2024-07-06 ENCOUNTER — Encounter: Payer: Self-pay | Admitting: Family Medicine

## 2024-07-06 ENCOUNTER — Telehealth: Payer: Self-pay

## 2024-07-06 ENCOUNTER — Other Ambulatory Visit: Payer: Self-pay

## 2024-07-06 NOTE — Telephone Encounter (Signed)
 LMOM for Cara informing that Pt needs honey thickened liquids.

## 2024-07-06 NOTE — Telephone Encounter (Signed)
 Received call back from [facility]- informed that she will need a written order stating honey thickened liquids faxed to them at [phone]. Letter faxed.

## 2024-07-06 NOTE — Telephone Encounter (Signed)
 Okay to give verbal orders?  ?

## 2024-07-06 NOTE — Telephone Encounter (Signed)
 Copied from CRM [id number]. Topic: Clinical - Home Health Verbal Orders >> Jul 06, 2024  1:06 PM Alfonso ORN wrote: Caller/Agency: [facility] Number: [phone] Service Requested: Pallative Care Frequency: n/a Any new concerns about the patient? No

## 2024-07-06 NOTE — Telephone Encounter (Signed)
 Spoke w/ Sherrell- verbal orders given.

## 2024-07-06 NOTE — Telephone Encounter (Signed)
 Copied from CRM [id number]. Topic: Clinical - Medical Advice >> Jul 06, 2024 11:19 AM Berwyn MATSU wrote: Reason for CRM:  Day program William [name] called in to ask for clarifications on the diet change. I advised I will reach out.   I called [facility] and was advised that they will see if CMA is available. I was left on hold for 3-5 mins. I would send message for call back.  I advised William [name] I will send message and they will call her back to answer her questions.  Per William [name] just needs to know the level of thickness like nector, honey, pudding,  per Cara they send in an order just make it specific to level thickness.   May you please assist.

## 2024-07-10 ENCOUNTER — Inpatient Hospital Stay: Admitting: Family Medicine

## 2024-07-12 ENCOUNTER — Ambulatory Visit: Admitting: Physical Therapy

## 2024-07-12 ENCOUNTER — Telehealth: Payer: Self-pay

## 2024-07-12 NOTE — Transitions of Care (Post Inpatient/ED Visit) (Signed)
" °  Transition of Care week 2  Visit Note  07/12/2024  Name: William [name] MRN: [id number]          DOB: 03/15/1941  Situation: Patient enrolled in [facility] 30-day program. Visit completed with patient's wife by telephone.   Spoke briefly with wife who states patient has 2 nurses seeing him already - One with [facility] and one with [facility] and declined to continue with TOC call program - unable to complete assessments or any information for today's call - will close [facility]   Background: Admit/Discharge Date:  1/15 -   1/19 William [name]         Primary Diagnosis: Pneumonia  Initial Transition Care Management Follow-up Telephone Call Discharge Date and Diagnosis: 07/02/24, Pneumonia   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   HTN (hypertension) 07/19/2015   Hyperlipidemia    Kidney disease, chronic, stage III (GFR 30-59 ml/min) (HCC)    Major neurocognitive disorder 01/09/2014   Mild, related to stroke history   Nocturia    Parkinson disease (HCC)    Renal insufficiency 06/25/2013   S/P carotid endarterectomy    BILATERAL ICA--  PATENT PER DUPLEX  05-19-2012   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

## 2024-07-17 ENCOUNTER — Ambulatory Visit (HOSPITAL_COMMUNITY): Admitting: Psychiatry

## 2024-07-19 ENCOUNTER — Ambulatory Visit

## 2024-07-19 ENCOUNTER — Ambulatory Visit: Admitting: Physical Therapy

## 2024-07-19 ENCOUNTER — Telehealth: Payer: Self-pay

## 2024-07-19 NOTE — Telephone Encounter (Unsigned)
 Copied from CRM #[id number]. Topic: Clinical - Home Health Verbal Orders >> Jul 19, 2024  3:59 PM Roselie BROCKS wrote: Caller/Agency: [facility] Callback Number: [phone] Service Requested: speech  Therapy Frequency: speech 1 week 7 weeks  Any new concerns about the patient? Yes

## 2024-07-20 NOTE — Telephone Encounter (Signed)
 Attempted to call the number provided x3 and went to busy signal each time

## 2024-07-24 ENCOUNTER — Ambulatory Visit (HOSPITAL_COMMUNITY): Admitting: Psychiatry

## 2024-07-26 ENCOUNTER — Ambulatory Visit: Admitting: Physical Therapy

## 2024-07-31 ENCOUNTER — Inpatient Hospital Stay: Admitting: Family Medicine

## 2024-08-08 ENCOUNTER — Ambulatory Visit (HOSPITAL_COMMUNITY): Admitting: Psychiatry

## 2024-08-15 ENCOUNTER — Ambulatory Visit: Admitting: Neurology

## 2024-08-30 ENCOUNTER — Ambulatory Visit: Admitting: Podiatry

## 2024-12-20 ENCOUNTER — Ambulatory Visit: Admitting: Family Medicine
